# Patient Record
Sex: Male | Born: 1965 | Race: Black or African American | Hispanic: No | Marital: Married | Smoking: Never smoker
Health system: Southern US, Community
[De-identification: ages and names within clinical notes are randomized; demographics above are authoritative.]

## PROBLEM LIST (undated history)

## (undated) DIAGNOSIS — E119 Type 2 diabetes mellitus without complications: Secondary | ICD-10-CM

## (undated) DIAGNOSIS — K2970 Gastritis, unspecified, without bleeding: Secondary | ICD-10-CM

## (undated) DIAGNOSIS — K219 Gastro-esophageal reflux disease without esophagitis: Secondary | ICD-10-CM

## (undated) DIAGNOSIS — D126 Benign neoplasm of colon, unspecified: Secondary | ICD-10-CM

## (undated) DIAGNOSIS — K2990 Gastroduodenitis, unspecified, without bleeding: Secondary | ICD-10-CM

## (undated) DIAGNOSIS — E7800 Pure hypercholesterolemia, unspecified: Secondary | ICD-10-CM

## (undated) DIAGNOSIS — I5022 Chronic systolic (congestive) heart failure: Secondary | ICD-10-CM

## (undated) DIAGNOSIS — I209 Angina pectoris, unspecified: Secondary | ICD-10-CM

## (undated) DIAGNOSIS — Z992 Dependence on renal dialysis: Secondary | ICD-10-CM

## (undated) DIAGNOSIS — I82409 Acute embolism and thrombosis of unspecified deep veins of unspecified lower extremity: Secondary | ICD-10-CM

## (undated) DIAGNOSIS — I2510 Atherosclerotic heart disease of native coronary artery without angina pectoris: Secondary | ICD-10-CM

## (undated) DIAGNOSIS — Z95810 Presence of automatic (implantable) cardiac defibrillator: Secondary | ICD-10-CM

## (undated) DIAGNOSIS — I255 Ischemic cardiomyopathy: Secondary | ICD-10-CM

## (undated) DIAGNOSIS — E785 Hyperlipidemia, unspecified: Secondary | ICD-10-CM

## (undated) DIAGNOSIS — K3184 Gastroparesis: Secondary | ICD-10-CM

## (undated) DIAGNOSIS — I509 Heart failure, unspecified: Secondary | ICD-10-CM

## (undated) DIAGNOSIS — K5090 Crohn's disease, unspecified, without complications: Secondary | ICD-10-CM

## (undated) DIAGNOSIS — R0600 Dyspnea, unspecified: Secondary | ICD-10-CM

## (undated) DIAGNOSIS — I10 Essential (primary) hypertension: Secondary | ICD-10-CM

## (undated) DIAGNOSIS — G4710 Hypersomnia, unspecified: Secondary | ICD-10-CM

## (undated) HISTORY — DX: Essential (primary) hypertension: I10

## (undated) HISTORY — DX: Hypersomnia, unspecified: G47.10

## (undated) HISTORY — PX: COLECTOMY: SHX59

## (undated) HISTORY — DX: Benign neoplasm of colon, unspecified: D12.6

## (undated) HISTORY — DX: Gastroduodenitis, unspecified, without bleeding: K29.90

## (undated) HISTORY — DX: Gastroparesis: K31.84

## (undated) HISTORY — DX: Acute embolism and thrombosis of unspecified deep veins of unspecified lower extremity: I82.409

## (undated) HISTORY — DX: Atherosclerotic heart disease of native coronary artery without angina pectoris: I25.10

## (undated) HISTORY — PX: APPENDECTOMY: SHX54

## (undated) HISTORY — PX: FETAL SURGERY FOR CONGENITAL HERNIA: SHX1618

## (undated) HISTORY — DX: Hyperlipidemia, unspecified: E78.5

## (undated) HISTORY — DX: Chronic systolic (congestive) heart failure: I50.22

## (undated) HISTORY — DX: Ischemic cardiomyopathy: I25.5

## (undated) HISTORY — DX: Gastritis, unspecified, without bleeding: K29.70

## (undated) HISTORY — DX: Dependence on renal dialysis: Z99.2

## (undated) HISTORY — DX: Presence of automatic (implantable) cardiac defibrillator: Z95.810

## (undated) HISTORY — DX: Type 2 diabetes mellitus without complications: E11.9

## (undated) HISTORY — PX: INGUINAL HERNIA REPAIR: SUR1180

## (undated) SURGERY — RIGHT HEART CATH
Anesthesia: LOCAL

## (undated) SURGERY — Surgical Case
Anesthesia: *Unknown

---

## 2000-01-13 ENCOUNTER — Encounter: Admission: RE | Admit: 2000-01-13 | Discharge: 2000-04-12 | Payer: Self-pay | Admitting: Endocrinology

## 2000-01-30 ENCOUNTER — Emergency Department (HOSPITAL_COMMUNITY): Admission: EM | Admit: 2000-01-30 | Discharge: 2000-01-30 | Payer: Self-pay | Admitting: *Deleted

## 2000-12-25 ENCOUNTER — Other Ambulatory Visit: Admission: RE | Admit: 2000-12-25 | Discharge: 2000-12-25 | Payer: Self-pay | Admitting: Gastroenterology

## 2000-12-25 ENCOUNTER — Encounter (INDEPENDENT_AMBULATORY_CARE_PROVIDER_SITE_OTHER): Payer: Self-pay

## 2001-02-14 ENCOUNTER — Encounter (INDEPENDENT_AMBULATORY_CARE_PROVIDER_SITE_OTHER): Payer: Self-pay | Admitting: Specialist

## 2001-02-14 ENCOUNTER — Ambulatory Visit (HOSPITAL_COMMUNITY): Admission: RE | Admit: 2001-02-14 | Discharge: 2001-02-14 | Payer: Self-pay | Admitting: Gastroenterology

## 2001-05-02 ENCOUNTER — Encounter: Payer: Self-pay | Admitting: Gastroenterology

## 2001-05-02 ENCOUNTER — Ambulatory Visit (HOSPITAL_COMMUNITY): Admission: RE | Admit: 2001-05-02 | Discharge: 2001-05-02 | Payer: Self-pay | Admitting: Gastroenterology

## 2001-05-14 ENCOUNTER — Ambulatory Visit (HOSPITAL_COMMUNITY): Admission: RE | Admit: 2001-05-14 | Discharge: 2001-05-14 | Payer: Self-pay | Admitting: Endocrinology

## 2001-05-14 ENCOUNTER — Encounter: Payer: Self-pay | Admitting: Endocrinology

## 2002-01-15 ENCOUNTER — Inpatient Hospital Stay (HOSPITAL_COMMUNITY): Admission: EM | Admit: 2002-01-15 | Discharge: 2002-01-16 | Payer: Self-pay

## 2002-01-15 ENCOUNTER — Encounter: Payer: Self-pay | Admitting: Emergency Medicine

## 2002-01-19 ENCOUNTER — Encounter: Payer: Self-pay | Admitting: Emergency Medicine

## 2002-01-19 ENCOUNTER — Emergency Department (HOSPITAL_COMMUNITY): Admission: EM | Admit: 2002-01-19 | Discharge: 2002-01-19 | Payer: Self-pay | Admitting: Emergency Medicine

## 2002-01-22 ENCOUNTER — Emergency Department (HOSPITAL_COMMUNITY): Admission: EM | Admit: 2002-01-22 | Discharge: 2002-01-22 | Payer: Self-pay | Admitting: *Deleted

## 2002-01-23 ENCOUNTER — Inpatient Hospital Stay (HOSPITAL_COMMUNITY): Admission: EM | Admit: 2002-01-23 | Discharge: 2002-01-29 | Payer: Self-pay | Admitting: Emergency Medicine

## 2002-01-23 ENCOUNTER — Ambulatory Visit (HOSPITAL_COMMUNITY): Admission: RE | Admit: 2002-01-23 | Discharge: 2002-01-23 | Payer: Self-pay | Admitting: Gastroenterology

## 2002-01-24 ENCOUNTER — Encounter: Payer: Self-pay | Admitting: Family Medicine

## 2002-01-24 ENCOUNTER — Encounter: Payer: Self-pay | Admitting: Gastroenterology

## 2002-01-25 ENCOUNTER — Encounter: Payer: Self-pay | Admitting: Family Medicine

## 2002-02-12 ENCOUNTER — Inpatient Hospital Stay (HOSPITAL_COMMUNITY): Admission: EM | Admit: 2002-02-12 | Discharge: 2002-02-26 | Payer: Self-pay | Admitting: Emergency Medicine

## 2002-02-12 ENCOUNTER — Encounter (INDEPENDENT_AMBULATORY_CARE_PROVIDER_SITE_OTHER): Payer: Self-pay | Admitting: Specialist

## 2002-02-12 ENCOUNTER — Encounter: Payer: Self-pay | Admitting: Internal Medicine

## 2002-02-19 ENCOUNTER — Encounter: Payer: Self-pay | Admitting: General Surgery

## 2002-03-07 ENCOUNTER — Encounter: Payer: Self-pay | Admitting: General Surgery

## 2002-03-08 ENCOUNTER — Inpatient Hospital Stay (HOSPITAL_COMMUNITY): Admission: RE | Admit: 2002-03-08 | Discharge: 2002-03-12 | Payer: Self-pay | Admitting: General Surgery

## 2002-03-11 ENCOUNTER — Encounter: Payer: Self-pay | Admitting: General Surgery

## 2002-03-14 ENCOUNTER — Inpatient Hospital Stay (HOSPITAL_COMMUNITY): Admission: AD | Admit: 2002-03-14 | Discharge: 2002-03-20 | Payer: Self-pay | Admitting: Endocrinology

## 2002-03-15 ENCOUNTER — Encounter: Payer: Self-pay | Admitting: Surgery

## 2002-03-16 ENCOUNTER — Encounter: Payer: Self-pay | Admitting: Internal Medicine

## 2002-04-02 ENCOUNTER — Inpatient Hospital Stay (HOSPITAL_COMMUNITY): Admission: EM | Admit: 2002-04-02 | Discharge: 2002-04-09 | Payer: Self-pay | Admitting: Endocrinology

## 2002-04-02 ENCOUNTER — Encounter: Payer: Self-pay | Admitting: Endocrinology

## 2002-04-04 ENCOUNTER — Encounter: Payer: Self-pay | Admitting: Endocrinology

## 2002-04-07 ENCOUNTER — Encounter: Payer: Self-pay | Admitting: Internal Medicine

## 2002-04-19 ENCOUNTER — Emergency Department (HOSPITAL_COMMUNITY): Admission: EM | Admit: 2002-04-19 | Discharge: 2002-04-19 | Payer: Self-pay | Admitting: Emergency Medicine

## 2002-04-21 ENCOUNTER — Inpatient Hospital Stay (HOSPITAL_COMMUNITY): Admission: EM | Admit: 2002-04-21 | Discharge: 2002-04-25 | Payer: Self-pay | Admitting: Emergency Medicine

## 2002-04-22 ENCOUNTER — Encounter: Payer: Self-pay | Admitting: Internal Medicine

## 2002-04-26 ENCOUNTER — Inpatient Hospital Stay (HOSPITAL_COMMUNITY): Admission: EM | Admit: 2002-04-26 | Discharge: 2002-05-08 | Payer: Self-pay | Admitting: Internal Medicine

## 2002-04-29 ENCOUNTER — Encounter: Payer: Self-pay | Admitting: Endocrinology

## 2002-04-30 ENCOUNTER — Encounter: Payer: Self-pay | Admitting: Internal Medicine

## 2002-05-01 ENCOUNTER — Encounter: Payer: Self-pay | Admitting: Internal Medicine

## 2002-05-11 ENCOUNTER — Encounter: Payer: Self-pay | Admitting: Internal Medicine

## 2002-05-11 ENCOUNTER — Inpatient Hospital Stay (HOSPITAL_COMMUNITY): Admission: EM | Admit: 2002-05-11 | Discharge: 2002-05-20 | Payer: Self-pay | Admitting: Emergency Medicine

## 2002-05-14 ENCOUNTER — Encounter: Payer: Self-pay | Admitting: Internal Medicine

## 2002-05-24 ENCOUNTER — Emergency Department (HOSPITAL_COMMUNITY): Admission: EM | Admit: 2002-05-24 | Discharge: 2002-05-24 | Payer: Self-pay | Admitting: Emergency Medicine

## 2002-05-25 ENCOUNTER — Emergency Department (HOSPITAL_COMMUNITY): Admission: EM | Admit: 2002-05-25 | Discharge: 2002-05-25 | Payer: Self-pay | Admitting: Emergency Medicine

## 2002-05-26 ENCOUNTER — Emergency Department (HOSPITAL_COMMUNITY): Admission: EM | Admit: 2002-05-26 | Discharge: 2002-05-26 | Payer: Self-pay | Admitting: Emergency Medicine

## 2002-05-30 ENCOUNTER — Encounter: Admission: RE | Admit: 2002-05-30 | Discharge: 2002-05-30 | Payer: Self-pay | Admitting: *Deleted

## 2002-06-07 ENCOUNTER — Encounter (INDEPENDENT_AMBULATORY_CARE_PROVIDER_SITE_OTHER): Payer: Self-pay | Admitting: *Deleted

## 2002-06-07 ENCOUNTER — Inpatient Hospital Stay (HOSPITAL_COMMUNITY): Admission: RE | Admit: 2002-06-07 | Discharge: 2002-06-24 | Payer: Self-pay

## 2002-06-19 ENCOUNTER — Encounter: Payer: Self-pay | Admitting: General Surgery

## 2002-06-28 ENCOUNTER — Encounter: Admission: RE | Admit: 2002-06-28 | Discharge: 2002-06-28 | Payer: Self-pay | Admitting: *Deleted

## 2002-07-05 ENCOUNTER — Encounter: Payer: Self-pay | Admitting: General Surgery

## 2002-07-05 ENCOUNTER — Encounter: Admission: RE | Admit: 2002-07-05 | Discharge: 2002-07-05 | Payer: Self-pay | Admitting: General Surgery

## 2002-07-06 ENCOUNTER — Encounter: Payer: Self-pay | Admitting: Emergency Medicine

## 2002-07-06 ENCOUNTER — Emergency Department (HOSPITAL_COMMUNITY): Admission: EM | Admit: 2002-07-06 | Discharge: 2002-07-06 | Payer: Self-pay | Admitting: Emergency Medicine

## 2002-07-13 ENCOUNTER — Inpatient Hospital Stay (HOSPITAL_COMMUNITY): Admission: EM | Admit: 2002-07-13 | Discharge: 2002-07-20 | Payer: Self-pay | Admitting: Emergency Medicine

## 2002-07-15 ENCOUNTER — Encounter: Payer: Self-pay | Admitting: Internal Medicine

## 2002-07-18 ENCOUNTER — Encounter: Payer: Self-pay | Admitting: Internal Medicine

## 2002-07-26 ENCOUNTER — Inpatient Hospital Stay (HOSPITAL_COMMUNITY): Admission: AD | Admit: 2002-07-26 | Discharge: 2002-07-31 | Payer: Self-pay | Admitting: Gastroenterology

## 2002-07-27 ENCOUNTER — Encounter: Payer: Self-pay | Admitting: Gastroenterology

## 2002-09-06 ENCOUNTER — Encounter: Admission: RE | Admit: 2002-09-06 | Discharge: 2002-09-06 | Payer: Self-pay | Admitting: *Deleted

## 2002-11-26 ENCOUNTER — Inpatient Hospital Stay (HOSPITAL_COMMUNITY): Admission: EM | Admit: 2002-11-26 | Discharge: 2002-11-29 | Payer: Self-pay | Admitting: Emergency Medicine

## 2002-11-27 ENCOUNTER — Encounter: Payer: Self-pay | Admitting: Internal Medicine

## 2004-04-04 ENCOUNTER — Inpatient Hospital Stay (HOSPITAL_COMMUNITY): Admission: EM | Admit: 2004-04-04 | Discharge: 2004-04-09 | Payer: Self-pay | Admitting: Emergency Medicine

## 2004-04-26 ENCOUNTER — Encounter (HOSPITAL_COMMUNITY): Admission: RE | Admit: 2004-04-26 | Discharge: 2004-07-25 | Payer: Self-pay | Admitting: Cardiovascular Disease

## 2004-05-03 ENCOUNTER — Ambulatory Visit: Payer: Self-pay | Admitting: *Deleted

## 2004-05-11 ENCOUNTER — Encounter: Admission: RE | Admit: 2004-05-11 | Discharge: 2004-08-09 | Payer: Self-pay | Admitting: Family Medicine

## 2004-05-28 ENCOUNTER — Ambulatory Visit (HOSPITAL_COMMUNITY): Admission: RE | Admit: 2004-05-28 | Discharge: 2004-05-29 | Payer: Self-pay | Admitting: Cardiovascular Disease

## 2004-07-26 ENCOUNTER — Encounter (HOSPITAL_COMMUNITY): Admission: RE | Admit: 2004-07-26 | Discharge: 2004-09-03 | Payer: Self-pay | Admitting: Cardiovascular Disease

## 2004-09-28 ENCOUNTER — Ambulatory Visit: Payer: Self-pay | Admitting: Cardiology

## 2004-11-15 ENCOUNTER — Inpatient Hospital Stay (HOSPITAL_COMMUNITY): Admission: EM | Admit: 2004-11-15 | Discharge: 2004-11-18 | Payer: Self-pay | Admitting: *Deleted

## 2005-01-03 ENCOUNTER — Ambulatory Visit: Payer: Self-pay | Admitting: Cardiology

## 2005-04-12 ENCOUNTER — Ambulatory Visit: Payer: Self-pay | Admitting: Cardiology

## 2005-04-29 ENCOUNTER — Encounter: Admission: RE | Admit: 2005-04-29 | Discharge: 2005-07-28 | Payer: Self-pay | Admitting: Family Medicine

## 2005-08-05 ENCOUNTER — Ambulatory Visit: Payer: Self-pay | Admitting: Cardiovascular Disease

## 2006-08-20 ENCOUNTER — Encounter: Payer: Self-pay | Admitting: Vascular Surgery

## 2006-08-20 ENCOUNTER — Inpatient Hospital Stay (HOSPITAL_COMMUNITY): Admission: EM | Admit: 2006-08-20 | Discharge: 2006-08-23 | Payer: Self-pay | Admitting: Emergency Medicine

## 2006-09-05 DIAGNOSIS — Z8719 Personal history of other diseases of the digestive system: Secondary | ICD-10-CM | POA: Insufficient documentation

## 2007-09-14 ENCOUNTER — Ambulatory Visit: Payer: Self-pay | Admitting: Gastroenterology

## 2007-09-14 LAB — CONVERTED CEMR LAB
Basophils Relative: 0 % (ref 0.0–1.0)
Eosinophils Relative: 1.4 % (ref 0.0–5.0)
Hemoglobin: 12.6 g/dL — ABNORMAL LOW (ref 13.0–17.0)
MCV: 83.3 fL (ref 78.0–100.0)
Platelets: 259 10*3/uL (ref 150–400)
RBC: 4.4 M/uL (ref 4.22–5.81)
Saturation Ratios: 10.7 % — ABNORMAL LOW (ref 20.0–50.0)
Transferrin: 254.1 mg/dL (ref >=212.0)
Vitamin B-12: 343 pg/mL (ref 211–911)

## 2007-10-10 ENCOUNTER — Encounter: Payer: Self-pay | Admitting: Gastroenterology

## 2007-10-10 ENCOUNTER — Ambulatory Visit: Payer: Self-pay | Admitting: Gastroenterology

## 2008-01-14 ENCOUNTER — Encounter (INDEPENDENT_AMBULATORY_CARE_PROVIDER_SITE_OTHER): Payer: Self-pay | Admitting: Emergency Medicine

## 2008-01-14 ENCOUNTER — Ambulatory Visit: Payer: Self-pay | Admitting: Vascular Surgery

## 2008-01-14 ENCOUNTER — Emergency Department (HOSPITAL_COMMUNITY): Admission: EM | Admit: 2008-01-14 | Discharge: 2008-01-14 | Payer: Self-pay | Admitting: Emergency Medicine

## 2008-01-25 ENCOUNTER — Ambulatory Visit: Payer: Self-pay | Admitting: Vascular Surgery

## 2008-01-30 ENCOUNTER — Ambulatory Visit (HOSPITAL_COMMUNITY): Admission: RE | Admit: 2008-01-30 | Discharge: 2008-01-30 | Payer: Self-pay | Admitting: Orthopedic Surgery

## 2008-02-09 ENCOUNTER — Inpatient Hospital Stay (HOSPITAL_COMMUNITY): Admission: EM | Admit: 2008-02-09 | Discharge: 2008-02-11 | Payer: Self-pay | Admitting: Emergency Medicine

## 2008-02-09 ENCOUNTER — Ambulatory Visit: Payer: Self-pay | Admitting: Internal Medicine

## 2008-02-11 ENCOUNTER — Telehealth: Payer: Self-pay | Admitting: Gastroenterology

## 2008-02-12 ENCOUNTER — Inpatient Hospital Stay (HOSPITAL_COMMUNITY): Admission: EM | Admit: 2008-02-12 | Discharge: 2008-02-13 | Payer: Self-pay | Admitting: Emergency Medicine

## 2008-09-05 HISTORY — PX: CARDIAC DEFIBRILLATOR PLACEMENT: SHX171

## 2008-09-25 DIAGNOSIS — K2990 Gastroduodenitis, unspecified, without bleeding: Secondary | ICD-10-CM

## 2008-09-25 DIAGNOSIS — R198 Other specified symptoms and signs involving the digestive system and abdomen: Secondary | ICD-10-CM | POA: Insufficient documentation

## 2008-09-25 DIAGNOSIS — K5730 Diverticulosis of large intestine without perforation or abscess without bleeding: Secondary | ICD-10-CM | POA: Insufficient documentation

## 2008-09-25 DIAGNOSIS — E785 Hyperlipidemia, unspecified: Secondary | ICD-10-CM | POA: Insufficient documentation

## 2008-09-25 DIAGNOSIS — K2970 Gastritis, unspecified, without bleeding: Secondary | ICD-10-CM | POA: Insufficient documentation

## 2008-09-25 DIAGNOSIS — E119 Type 2 diabetes mellitus without complications: Secondary | ICD-10-CM | POA: Insufficient documentation

## 2008-09-25 DIAGNOSIS — D126 Benign neoplasm of colon, unspecified: Secondary | ICD-10-CM | POA: Insufficient documentation

## 2008-09-30 ENCOUNTER — Ambulatory Visit: Payer: Self-pay | Admitting: Gastroenterology

## 2008-12-12 ENCOUNTER — Inpatient Hospital Stay (HOSPITAL_COMMUNITY): Admission: EM | Admit: 2008-12-12 | Discharge: 2008-12-16 | Payer: Self-pay | Admitting: Cardiology

## 2009-01-02 ENCOUNTER — Inpatient Hospital Stay (HOSPITAL_COMMUNITY): Admission: RE | Admit: 2009-01-02 | Discharge: 2009-01-03 | Payer: Self-pay | Admitting: Cardiology

## 2009-10-07 ENCOUNTER — Inpatient Hospital Stay (HOSPITAL_COMMUNITY): Admission: EM | Admit: 2009-10-07 | Discharge: 2009-10-08 | Payer: Self-pay | Admitting: Emergency Medicine

## 2010-11-19 ENCOUNTER — Encounter (INDEPENDENT_AMBULATORY_CARE_PROVIDER_SITE_OTHER): Payer: Self-pay | Admitting: *Deleted

## 2010-11-23 NOTE — Letter (Signed)
Summary: Appointment - Reminder [place], [place]  [address] [place]   [place], [place] [address]   Phone: [phone]  Fax: [phone]     November 19, 2010 MRN: [id number]   [facility] [place], [place]  [address]   Dear Mr. MCGREAL,  [facility] records indicate that it is time to schedule a follow-up appointment.  Dr.Allred recommended that you follow up with [place] in April. It is very important that we reach you to schedule this appointment. We look forward to participating in your health care needs. Please contact us at the number listed above at your earliest convenience to schedule your appointment.  If you are unable to make an appointment at this time, give [place] a call so we can update our records.     Sincerely,   Public relations account executive

## 2010-11-24 LAB — DIFFERENTIAL
Basophils Absolute: 0.1 10*3/uL (ref 0.0–0.1)
Basophils Relative: 1 % (ref 0–1)
Eosinophils Absolute: 0.2 10*3/uL (ref 0.0–0.7)
Eosinophils Relative: 4 % (ref 0–5)
Neutrophils Relative %: 57 % (ref 43–77)

## 2010-11-24 LAB — CBC
HCT: 37 % — ABNORMAL LOW (ref 39.0–52.0)
Hemoglobin: 12.1 g/dL — ABNORMAL LOW (ref 13.0–17.0)
Hemoglobin: 12.2 g/dL — ABNORMAL LOW (ref 13.0–17.0)
MCHC: 33.9 g/dL (ref 30.0–36.0)
MCV: 87 fL (ref 78.0–100.0)
RBC: 4.15 MIL/uL — ABNORMAL LOW (ref 4.22–5.81)
WBC: 5.7 10*3/uL (ref 4.0–10.5)

## 2010-11-24 LAB — GLUCOSE, CAPILLARY
Glucose-Capillary: 118 mg/dL — ABNORMAL HIGH (ref 70–99)
Glucose-Capillary: 212 mg/dL — ABNORMAL HIGH (ref 70–99)

## 2010-11-24 LAB — LIPID PANEL
HDL: 51 mg/dL (ref >39)
LDL Cholesterol: 135 mg/dL — ABNORMAL HIGH (ref 0–99)
Total CHOL/HDL Ratio: 4.2 RATIO
VLDL: 29 mg/dL (ref 0–40)

## 2010-11-24 LAB — PROTIME-INR
INR: 1.35 (ref 0.00–1.49)
Prothrombin Time: 16.6 seconds — ABNORMAL HIGH (ref 11.6–15.2)

## 2010-11-24 LAB — BASIC METABOLIC PANEL
BUN: 17 mg/dL (ref 6–23)
Chloride: 105 mEq/L (ref 96–112)
GFR calc Af Amer: >60 mL/min (ref >60)
Potassium: 4.1 mEq/L (ref 3.5–5.1)

## 2010-11-24 LAB — HEMOGLOBIN A1C: Hgb A1c MFr Bld: 11.2 % — ABNORMAL HIGH (ref 4.6–6.1)

## 2010-11-24 LAB — CARDIAC PANEL(CRET KIN+CKTOT+MB+TROPI): CK, MB: 1.7 ng/mL (ref 0.3–4.0)

## 2010-11-24 LAB — CK TOTAL AND CKMB (NOT AT ARMC): Relative Index: 1.1 (ref 0.0–2.5)

## 2010-11-24 LAB — POCT CARDIAC MARKERS
CKMB, poc: 1.6 ng/mL (ref 1.0–8.0)
Myoglobin, poc: 86 ng/mL (ref 12–200)

## 2010-11-24 LAB — TROPONIN I: Troponin I: 0.04 ng/mL (ref 0.00–0.06)

## 2010-11-24 LAB — BRAIN NATRIURETIC PEPTIDE: Pro B Natriuretic peptide (BNP): 48 pg/mL (ref 0.0–100.0)

## 2010-12-14 LAB — PROTIME-INR: Prothrombin Time: 14 seconds (ref 11.6–15.2)

## 2010-12-14 LAB — GLUCOSE, CAPILLARY: Glucose-Capillary: 104 mg/dL — ABNORMAL HIGH (ref 70–99)

## 2010-12-15 LAB — BASIC METABOLIC PANEL
BUN: 14 mg/dL (ref 6–23)
BUN: 16 mg/dL (ref 6–23)
BUN: 23 mg/dL (ref 6–23)
CO2: 28 mEq/L (ref 19–32)
Calcium: 8.3 mg/dL — ABNORMAL LOW (ref 8.4–10.5)
Calcium: 8.5 mg/dL (ref 8.4–10.5)
Creatinine, Ser: 0.87 mg/dL (ref 0.4–1.5)
Creatinine, Ser: 1.01 mg/dL (ref 0.4–1.5)
Creatinine, Ser: 1.1 mg/dL (ref 0.4–1.5)
GFR calc Af Amer: >60 mL/min (ref >60)
GFR calc non Af Amer: >60 mL/min (ref >60)
GFR calc non Af Amer: >60 mL/min (ref >60)
GFR calc non Af Amer: >60 mL/min (ref >60)
GFR calc non Af Amer: >60 mL/min (ref >60)
Glucose, Bld: 105 mg/dL — ABNORMAL HIGH (ref 70–99)
Glucose, Bld: 105 mg/dL — ABNORMAL HIGH (ref 70–99)
Glucose, Bld: 106 mg/dL — ABNORMAL HIGH (ref 70–99)
Glucose, Bld: 54 mg/dL — ABNORMAL LOW (ref 70–99)
Potassium: 3.4 mEq/L — ABNORMAL LOW (ref 3.5–5.1)
Potassium: 4 mEq/L (ref 3.5–5.1)
Potassium: 4 mEq/L (ref 3.5–5.1)
Sodium: 138 mEq/L (ref 135–145)
Sodium: 140 mEq/L (ref 135–145)

## 2010-12-15 LAB — CBC
HCT: 36.7 % — ABNORMAL LOW (ref 39.0–52.0)
HCT: 37.2 % — ABNORMAL LOW (ref 39.0–52.0)
HCT: 37.2 % — ABNORMAL LOW (ref 39.0–52.0)
Hemoglobin: 11.7 g/dL — ABNORMAL LOW (ref 13.0–17.0)
Hemoglobin: 12.2 g/dL — ABNORMAL LOW (ref 13.0–17.0)
Hemoglobin: 12.3 g/dL — ABNORMAL LOW (ref 13.0–17.0)
MCHC: 32.8 g/dL (ref 30.0–36.0)
MCV: 81.3 fL (ref 78.0–100.0)
MCV: 82.7 fL (ref 78.0–100.0)
Platelets: 191 10*3/uL (ref 150–400)
Platelets: 220 10*3/uL (ref 150–400)
Platelets: 225 10*3/uL (ref 150–400)
RBC: 4.47 MIL/uL (ref 4.22–5.81)
RBC: 4.5 MIL/uL (ref 4.22–5.81)
RDW: 15 % (ref 11.5–15.5)
RDW: 15.6 % — ABNORMAL HIGH (ref 11.5–15.5)
RDW: 15.9 % — ABNORMAL HIGH (ref 11.5–15.5)
RDW: 15.9 % — ABNORMAL HIGH (ref 11.5–15.5)
WBC: 5.6 10*3/uL (ref 4.0–10.5)
WBC: 6.9 10*3/uL (ref 4.0–10.5)

## 2010-12-15 LAB — GLUCOSE, CAPILLARY
Glucose-Capillary: 42 mg/dL — ABNORMAL LOW (ref 70–99)
Glucose-Capillary: 62 mg/dL — ABNORMAL LOW (ref 70–99)
Glucose-Capillary: 89 mg/dL (ref 70–99)
Glucose-Capillary: 110 mg/dL — ABNORMAL HIGH (ref 70–99)
Glucose-Capillary: 125 mg/dL — ABNORMAL HIGH (ref 70–99)
Glucose-Capillary: 150 mg/dL — ABNORMAL HIGH (ref 70–99)
Glucose-Capillary: 154 mg/dL — ABNORMAL HIGH (ref 70–99)
Glucose-Capillary: 190 mg/dL — ABNORMAL HIGH (ref 70–99)
Glucose-Capillary: 228 mg/dL — ABNORMAL HIGH (ref 70–99)
Glucose-Capillary: 74 mg/dL (ref 70–99)
Glucose-Capillary: 84 mg/dL (ref 70–99)

## 2010-12-15 LAB — CARDIAC PANEL(CRET KIN+CKTOT+MB+TROPI)
CK, MB: 2.7 ng/mL (ref 0.3–4.0)
Total CK: 225 U/L (ref 7–232)
Troponin I: 0.03 ng/mL (ref 0.00–0.06)

## 2010-12-15 LAB — PROTIME-INR
INR: 1.1 (ref 0.00–1.49)
INR: 1.2 (ref 0.00–1.49)
Prothrombin Time: 14.5 seconds (ref 11.6–15.2)
Prothrombin Time: 14.2 seconds (ref 11.6–15.2)
Prothrombin Time: 14.3 seconds (ref 11.6–15.2)
Prothrombin Time: 15.1 seconds (ref 11.6–15.2)
Prothrombin Time: 15.2 seconds (ref 11.6–15.2)

## 2010-12-15 LAB — COMPREHENSIVE METABOLIC PANEL
Alkaline Phosphatase: 91 U/L (ref 39–117)
BUN: 11 mg/dL (ref 6–23)
CO2: 32 mEq/L (ref 19–32)
Chloride: 101 mEq/L (ref 96–112)
Creatinine, Ser: 0.81 mg/dL (ref 0.4–1.5)
GFR calc non Af Amer: >60 mL/min (ref >60)
Total Bilirubin: 0.7 mg/dL (ref 0.3–1.2)

## 2010-12-15 LAB — DIFFERENTIAL
Basophils Absolute: 0 10*3/uL (ref 0.0–0.1)
Basophils Relative: 0 % (ref 0–1)
Eosinophils Relative: 4 % (ref 0–5)
Lymphocytes Relative: 26 % (ref 12–46)
Neutro Abs: 3.5 10*3/uL (ref 1.7–7.7)

## 2010-12-15 LAB — APTT: aPTT: 82 seconds — ABNORMAL HIGH (ref 24–37)

## 2010-12-15 LAB — HEPARIN LEVEL (UNFRACTIONATED)
Heparin Unfractionated: 0.9 IU/mL — ABNORMAL HIGH (ref 0.30–0.70)
Heparin Unfractionated: 1.17 IU/mL — ABNORMAL HIGH (ref 0.30–0.70)
Heparin Unfractionated: <0.1 IU/mL — ABNORMAL LOW (ref 0.30–0.70)

## 2010-12-15 LAB — HEMOGLOBIN A1C: Hgb A1c MFr Bld: 9.7 % — ABNORMAL HIGH (ref 4.6–6.1)

## 2010-12-15 LAB — MAGNESIUM: Magnesium: 1.5 mg/dL (ref 1.5–2.5)

## 2010-12-15 LAB — BRAIN NATRIURETIC PEPTIDE: Pro B Natriuretic peptide (BNP): 633 pg/mL — ABNORMAL HIGH (ref 0.0–100.0)

## 2011-01-18 NOTE — Consult Note (Signed)
NEW PATIENT CONSULTATION   [name], [name]  DOB:  March 25, 1966                                       01/25/2008  HYIFO#:[id number]   The patient presents today for evaluation of right leg deep venous  thrombosis.  He is referred by Dr. Odis [name] who had been  following him for history of deep venous thrombosis.  The patient had an  episode of pain in his right leg and had a repeat ultrasound done at  that [facility] on Jan 14, 2008.  This revealed a right  popliteal deep venous thrombosis with a partial occlusion and the common  and profunda were open.  The patient did have triphasic wave forms in  the popliteal and posterior tibial arteries have.  Did not have any  evidence of left leg deep venous thrombosis.  He was treated problem  with Lovenox and Coumadin and had continued pain in his right foot.  He  did have plain films of his right foot on 01/18/2008 and these showed no  evidence of fracture.   PAST HISTORY:  Is significant for a long history of insulin dependent  diabetes.  He has elevated blood pressure elevated cholesterol.  He has  undergone prior cardiac stenting after myocardial infarction in 2005.  Does have a strong family history of premature atherosclerotic disease  in father.  He is married with three children.  Works as a Radiographer, therapeutic.  Does not smoke, or drink alcohol.   REVIEW OF SYSTEMS:  He does have a history of esophageal reflux,  diarrhea, and prior thrombus.   ALLERGIES:  No known allergies.   CURRENT MEDICATIONS:  Coumadin, Lantus, Furosemide, Lipitor, colestipol,  calcium supplement, lisinopril, Nexium, Toprol, and vitamins.   PHYSICAL EXAM:  A well-developed black male appearing stated age of 76.  He does have 1 to 2+ dorsalis pedis pulse on the right and no evidence  of tissue loss.  He has not had any swelling and does not have any  tenderness in his calf or popliteal fossa.  He does have compression  garments that he wears continuously.   I reviewed his head duplex with the patient.  I do not feel that he has  any arterial compromise in his right foot and I do not believe that his  popliteal thrombus is responsible for the pain that he is having in his  foot.  This has been persistent and he is requiring narcotics for relief  of this.  I have discussed this with Dr. Maylon [name] by telephone and he,  in all likelihood, will undergo further orthopedic evaluation for  determination of the cause of his pain.  Also would consider coagulable  workup with his history of multiple prior deep venous thromboses.  He  will see [place] again on an as-needed basis.   [name], M.D.  Electronically Signed   TFE/MEDQ  D:  01/25/2008  T:  01/29/2008  Job:  [id number]

## 2011-01-18 NOTE — Discharge Summary (Signed)
NAMECARA, AGUINO            ACCOUNT NO.:  [id number]   MEDICAL RECORD NO.:  [id number]          PATIENT TYPE:  INP   LOCATION:  2039                         FACILITY:  [facility]   PHYSICIAN:  Jerline Pain, MD      DATE OF BIRTH:  [birth year]   DATE OF ADMISSION:  01/02/2009  DATE OF DISCHARGE:  01/03/2009                               DISCHARGE SUMMARY   CARDIOLOGIST:  Jerline Pain, MD   FINAL DIAGNOSES:  1. Ischemic cardiomyopathy, ejection fraction less than 35%.  2. Insertion of dual-chamber St. Jude defibrillator, serial number      [id number], model Current Plus DR [id number].  Atrial lead Tendril,      serial number [id number].  Ventricular lead Durata, serial number      [id number].  Atrial P-wave 4.3 mV, threshold 0.5 volts at 0.5      milliseconds, impedance 410 ohms, pacing less than 1%, episodes no      node switches.  Ventricular R-wave 11.7 mV, threshold 0.5 volts at      0.5 milliseconds, impedance 530 ohms, less than 1% pacing, no      tachycardiac episodes overnight.  St. Jude health #1 [id number].  3. Diabetes mellitus.   BRIEF HOSPITAL COURSE:  A 46 year old male with ischemic cardiomyopathy,  ejection fraction less than 35% with insertion of dual chamber ICD for  primary prevention.  This was performed by Dr. Rodell Perna.  Overnight,  he had blood pressures in the 140s-150s/100 and was given hydralazine.  On the day of discharge, ICD interrogation as above shows normal  operation.  Chest x-ray performed postoperatively shows no evidence of  pneumothorax.   VITAL SIGNS:  Blood pressure 111/75, heart rate 64, respirations 20, and  temperature 97.6.  GENERAL:  Alert and oriented x3 in no acute distress.  CARDIOVASCULAR:  Regular rate and rhythm.  ICD site is clean, dry, and  intact.  He is still wearing arm harness.  ABDOMEN:  Soft and nontender.  Normoactive bowel sounds.  EXTREMITIES:  No clubbing, cyanosis, or edema.  The patient does feel  nausea.   DISCHARGE  INSTRUCTIONS:  No lifting above his head for the next 30 days.  Discharge sheet given to him.  He has a followup appointment with Doristine Bosworth on Jan 09, 2009, at 10:10 a.m.  He will also restart his Coumadin  as previously prescribed.   DISCHARGE MEDICATIONS:  1. Nexium 40 mg a day.  2. Potassium as before with 3 baby aspirin 81 mg a day.  3. Lantus as before.  4. Lisinopril 40 mg a day.  5. Coreg 25 mg twice a day.  6. Lipitor 80 mg a day.  7. Hydralazine 25 mg 3 times a day.  8. Coumadin home dose.  9. Digoxin 0.125 mg a day.  10.Imdur 60 mg a day.  11.Lasix 80 mg twice a day.  12.Vicodin as needed for pain.   The patient is to contact us immediately if any worrisome symptoms  develop such as fevers, chills, nausea, vomiting, or chest pain.      Jerline Pain, MD  Electronically Signed     MCS/MEDQ  D:  01/03/2009  T:  01/04/2009  Job:  [id number]   cc:   Barnett Abu, M.D.

## 2011-01-18 NOTE — H&P (Signed)
[name], [name]            ACCOUNT NO.:  [id number]   MEDICAL RECORD NO.:  [id number]          PATIENT TYPE:  INP   LOCATION:  0102                         [facility]ACILITY[facility]  [facility]   PHYSICIAN:  Annita Brod, M.D.DATE OF BIRTH:  18-Mar-1966   DATE OF ADMISSION:  02/11/2008  DATE OF DISCHARGE:                              HISTORY & PHYSICAL   PRIMARY CARE PHYSICIAN:  The patient's PCP is Candace Gallus, M.D.   CHIEF COMPLAINT:  Abdominal pain with nausea and vomiting.   HISTORY OF THE PRESENT ILLNESS:  The patient is a 45 year old African-  American male who was first admitted [facility]  February 09, 2008 for diabetic gastroparesis.  He was treated for several  days and discharged on February 11, 2008.  He continued to have episodes of  nausea and vomiting at home, was unable to keep anything down, and  returned back to the emergency room that evening.  CT scan of the  abdomen was unremarkable for obstruction, although it did note some  peripheral signs of pneumonia that happened to be noted on the scan.  The patient was given a dose of Rocephin and Zithromax as well as  multiple doses of pain and nausea medications.  Because of the patient's  persistent symptoms it was felt best that he come into the emergency  room to be admitted for further evaluation and treatment.   REVIEW OF SYSTEMS:  Currently he is tired and complains of continued  nausea and vomiting.  He denies any headaches, vision changes or  dysphagia.  No  chest pain or palpitations.  No shortness of breath,  wheezing or coughing.  He is complaining of some generalized abdominal  pain.  No hematuria or dysuria.  No diarrhea.  No focal extremity  numbness, weakness or pain.  The review of systems is otherwise  negative.   PAST MEDICAL HISTORY:  The patient's past medical history includes:  1. Diabetes mellitus.  2. Previous history of gastroparesis.  3. History of recurrent DVT on  Coumadin.  4. Hyperlipidemia.  5. History of inflammatory bowel disease status post right      hemicolectomy.  6. Hypertension.  7. GERD.  8. CAD status post stent.   MEDICATIONS:  The medications the patient is on include:  1. Coumadin.  2. Lantus 100 subcu daily.  3. Lasix 40.  4. Lipitor 80.  5. Colestipol 1.  6. Kay-Ciel 10.  7. Lisinopril 40.  8. Nexium 40.  9. Toprol XL 25.  10.Multivitamin daily.  11.Reglan 10 every 6 hours as needed.   ALLERGIES:  He has no known drug allergies.   SOCIAL HISTORY:  No tobacco, alcohol or drug use.   FAMILY HISTORY:  The family history is noncontributory.   PHYSICAL EXAMINATION:  VITAL SIGNS:  The patient's vital signs on  admission; heart rate initially was 130 and is now down to 118, blood  pressure initially was 183/111 and is now down to 168/104, respiratory  rate is 22, and O2 sat is 100% on room air.  GENERAL APPEARANCE:  In general he is somewhat  somnolent, though he is  alert and oriented x3.  HEENT:  Normocephalic and atraumatic.  Mucous membranes are dry.  NECK:  The patient has no carotid bruits.  HEART:  The heart has an irregular rhythm with mild tachycardia.  LUNGS:  The lungs are clear to auscultation bilaterally.  ABDOMEN:  The abdomen is soft and nontender, but mildly distended.  Hypoactive bowel sounds.  EXTREMITIES:  The extremities show no clubbing, cyanosis or edema.   LABORATORY DATA:  White count 9.9, H&H 14.6/43, MCV 82, and platelet  count 278,000 with 83% neutrophils.  Sodium 140, potassium 2.9, chloride  102, bicarb 26, BUN 3, creatinine 0.73, and glucose 127.  LFTs are  notable for an albumin of 3.2, otherwise they were unremarkable.  Lipase  level 15.  INR 2.  UA is unremarkable, except for a small amount a  hemoglobin with 3-6 red cells.  Acute abdominal series and chest notes  questionable left perihilar airspace opacity and nonobstructive bowel  gas pattern.  CT notes patchy lingular and lower lobe  opacities;  lingular density could represent alveolitis versus bronchopneumonia.  The lower lobe opacities are most less likely atelectasis, there is mild  fatty infiltration of the liver and coronary artery disease.  No acute  findings in the pelvis.   ASSESSMENT/PLAN:  1. Gastroparesis.  We will treat with nothing per os, intravenous      proton pump inhibitor and Reglan scheduled around-the-clock plus as      needed pain and nausea medications.  2. Hypokalemia; we will replace.  3. Pneumonia.  Given the fact that the patient is breathing      comfortably well at this time it is possible that this could be      aspiration.  For now will treat as community-acquired pneumonia.  4. Diabetes mellitus.  Nothing per os and sliding scale.      Annita Brod, M.D.  Electronically Signed     SKK/MEDQ  D:  02/12/2008  T:  02/12/2008  Job:  [id number]   cc:   Candace Gallus, M.D.  Fax: [phone]

## 2011-01-18 NOTE — Cardiovascular Report (Signed)
NAMECHANDON, [name]            ACCOUNT NO.:  [id number]   MEDICAL RECORD NO.:  [id number]          PATIENT TYPE:  INP   LOCATION:  2031                         FACILITY:  [facility]   PHYSICIAN:  Jerline Pain, MD      DATE OF BIRTH:  February 17, 1966   DATE OF PROCEDURE:  12/15/2008  DATE OF DISCHARGE:                            CARDIAC CATHETERIZATION   INDICATIONS:  A 45 year old male with ischemic  cardiomyopathy/hypertensive cardiomyopathy with prior percutaneous  interventions to the LAD at the bifurcation of a D2 as well as  percutaneous intervention to D1, last intervention being performed in  2006 with worsening shortness of breath and exertional chest discomfort.  Prior nuclear stress test showed no evidence of ischemia approximately 1  year ago with an ejection fraction of 39% calculated.   PROCEDURES:  1. Left heart catheterization.  2. Selective coronary angiography.  3. Left ventriculogram.   PROCEDURE DETAILS:  Informed consent was obtained.  Risk of stroke,  heart attack, death, bleeding, renal impairment, and arterial damage  were explained to the patient at length.  He was placed on the  catheterization table, prepped in a sterile fashion.  Lidocaine 1% was  used for local anesthesia.  Fluoroscopy was used to visualize the  femoral head.  Modified Seldinger technique was used to selectively  cannulate the right femoral artery.  A Judkins left #4 catheter was used  to selectively cannulate the left main artery and a Judkins right #4  catheter was used to selectively cannulate the right coronary artery.  Multiple views with hand injection of Omnipaque were obtained in  multiple views.  An angled pigtail was then used to cross the aortic  valve in the left ventricle.  A 34 mL of contrast was power injected in  the RAO position for a ventriculogram.  Following procedure, the sheath  was removed after ACT was verified.   FINDINGS:  1. Left main artery - widely patent.  2. Left anterior descending artery - there are two main diagonal      branches, then the left anterior descending artery continues to      wrap around the apex.  The previously placed stents, the first      intervention being the LAD/D2 bifurcation with two kissing stents      are widely patent.  The third intervention being the first diagonal      branch, mid section is also widely patent.  There is approximately      40% mid to distal LAD disease distal to the previously placed      stent, which appears nonobstructive.  There are minor luminal      irregularities throughout these vessels.  3. Circumflex artery - so large caliber vessel giving rise to two      obtuse marginal branches with minor irregularities throughout.  4. Right coronary artery - this is a dominant vessel giving rise to      the PDA.  There is mid 40% stenosis.  Otherwise minor      irregularities throughout.  5. Left ventriculogram - ejection fraction is severely decreased at  approximately 15-20% with akinesis of the entire inferior wall      (prior scar seen on nuclear stress test).  There does not appear to      be any objective evidence of thrombus.  No significant mitral      regurgitation is present.   HEMODYNAMICS:  Left ventricular pressure is 116 with an end-diastolic  pressure of 20.  Aortic pressure 117/84 with a mean of 98 mmHg.   IMPRESSIONS:  1. Widely patent previously placed stents in the left anterior      descending coronary artery/second diagonal branch as well as first      diagonal branch with 40% mid to distal left anterior descending      coronary artery stenosis distal to the previously placed stent.  2. A 40% mid right coronary artery stenosis - non-flow limiting.  3. Severely decreased ejection fraction of approximately 15-20% with      akinesis of the entire inferior wall with otherwise severe global      hypokinesis.  4. Elevated left ventricular end-diastolic pressure.   PLAN:   Continue with aggressive medical management including beta-  blocker, ACE inhibitor, digoxin, isosorbide mononitrate.  I will have  discussion with him regarding defibrillator.  We will increase his  isosorbide mononitrate to 60 mg to hopefully provide increasing  antianginal support.  Compared to prior nuclear stress test  approximately 1 year ago, his ejection fraction has decreased.  Note,  his QRS complex is narrow.      Jerline Pain, MD  Electronically Signed     MCS/MEDQ  D:  12/15/2008  T:  12/16/2008  Job:  [id number]   cc:   Candace Gallus, M.D.

## 2011-01-18 NOTE — Discharge Summary (Signed)
NAMEALEJOS, [name]            ACCOUNT NO.:  [id number]   MEDICAL RECORD NO.:  [id number]          PATIENT TYPE:  INP   LOCATION:  2031                         FACILITY:  [facility]   PHYSICIAN:  Jerline Pain, MD      DATE OF BIRTH:  06-08-1966   DATE OF ADMISSION:  12/12/2008  DATE OF DISCHARGE:  12/16/2008                               DISCHARGE SUMMARY   DISCHARGE DIAGNOSES:  1. Acute on chronic systolic heart failure, acute phase resolved.  2. Ischemic cardiomyopathy, ejection fraction 15-20%.  3. Coronary artery disease.  4. Diabetes mellitus.  5. Hypertension.  6. Dyslipidemia.  7. Long-term anticoagulation with Coumadin with a history of deep vein      thrombosis, lifelong Coumadin.   HOSPITAL COURSE:  Mr. Elza is a 45 year old male patient with a known  ischemic cardiomyopathy who had anterior wall myocardial infarction in  2005, LAD bifurcation intervention in July 2005, circumflex intervention  in 2005 with a repeat diagonal intervention with a Cypher stent in March  2006.   He has been in and out of the clinic over the past 2-3 weeks with  worsening shortness of breath that has been fluctuating.  After being  placed on 80 mg of Lasix p.o. b.i.d., he was better, but his BNP was  still increasing.  He was also complaining of more exertional chest  discomfort, which had not been present previously.  Given this finding,  he was admitted to the hospital.   His cardiac markers were negative, but his BNP was around 1200.  He was  aggressively diuresed with IV Lasix.  Ultimately, he did undergo cardiac  catheterization that showed his EF to be 15-20%.  The LAD had a distal  40% lesion.  All stents were open.  The right coronary artery had a mid  40% RCA lesion.  The LV was dilated with inferior wall akinesis,  therefore cardiac catheterization showed patent stents without any  obstructive disease.  Medical management only.   By December 16, 2008, the patient was in stable,  but improved condition and  we felt it was safe for him to go home.  No weights have been recorded.   LABORATORY STUDIES:  Sodium 139, potassium 4.0, BUN of 16, creatinine  1.01, hemoglobin 12.3, hematocrit 36.7, platelets 225, white count 5.5,  PT 14.2, INR 1.1.   DISCHARGE MEDICATIONS:  1. Nexium 40 mg a day.  2. Potassium as prior to admission.  3. Baby aspirin 81 mg a day.  4. Lantus as before admission.  5. Lisinopril 40 mg a day.  6. Coreg 25 mg twice a day.  7. Lipitor 80 mg a day.  8. Hydralazine 25 mg 1 p.o. t.i.d.  9. Coumadin 7.5 mg a day.  10.Digoxin 0.125 mg a day.  11.Imdur 30 mg a day, he is to take 2 of these.  I went ahead and      wrote a prescription for the 60 mg tablets, which we want him to be      on.  12.Sublingual nitroglycerin p.r.n. chest pain.   He is to remain on a  low-sodium heart-healthy diabetic diet.  Increase  activity slowly.  No lifting over 10 pounds for 1 week.  No driving for  2 days.  Clean cath site gently with soap and water.  No scrubbing.  Follow up with Dr. Marlou Porch on December 25, 2008, at 3:15 p.m.  Follow up  with his primary physician for Coumadin check and for diabetes  management in 1 week.      Joesphine Bare, P.A.      Jerline Pain, MD  Electronically Signed    LB/MEDQ  D:  12/16/2008  T:  12/17/2008  Job:  [id number]   cc:   Candace Gallus, M.D.

## 2011-01-18 NOTE — Discharge Summary (Signed)
[name], [name]            ACCOUNT NO.:  [id number]   MEDICAL RECORD NO.:  [id number]          PATIENT TYPE:  INP   LOCATION:  62                         [facility]ACILITY[facility]  [facility]   PHYSICIAN:  Corinna L. Conley Canal, MDDATE OF BIRTH:  February 05, 1966   DATE OF ADMISSION:  02/09/2008  DATE OF DISCHARGE:  02/11/2008                               DISCHARGE SUMMARY   DISCHARGE DIAGNOSES:  1. Diabetic ketoacidosis, mild.  2. Insulin dependent diabetes.  3. History of noncompliance.  4. History of gastroparesis.  5. Vomiting secondary to above.  6. Recurrent deep venous thrombosis.  7. Supratherapeutic Coumadin.  8. Hyperlipidemia.  9. Inflammatory bowel disease status post right hemicolectomy.  10.Hypertension.  11.Gastroesophageal reflux disease.  12.Coronary artery disease with stent.   DISCHARGE MEDICATIONS:  1. Hold Coumadin tonight and then decrease dose to 5 mg nightly, then      as per Coumadin clinic.  2. Lantus 100 units subcutaneously daily.  3. Lasix 40 mg a day.  4. Lipitor 80 mg a day.  5. Colestipol 1 mg a day.  6. KCl 10 mEq a day.  7. Lisinopril 40 mg a day.  8. Nexium 40 mg a day.  9. Toprol-XL 25 mg a day.  10.Multivitamin a day.  11.Reglan 10 mg every 6 hours as needed for nausea or vomiting.   FOLLOW UP:  Followup with Dr. Maylon Peppers later on this week as previously  scheduled to monitor symptoms as well as INR.  He is being referred to  outpatient diabetic classes.   ACTIVITY:  As previous.   DIET:  Diabetic, low-salt.   CONDITION ON DISCHARGE:  Stable.   PROCEDURES:  None.   CONSULTATIONS:  None.   LABORATORY DATA:  ABG on 2 liters nasal cannula showed a pH of 7.340,  PCO2 47, PO2 118, bicarbonate 28, oxygen saturation 97%, ionized calcium  1.05.  CBC on admission significant for a white blood cell count of  12,000, otherwise unremarkable.  INR on admission was 3.2.  Basic  metabolic panel on admission significant for potassium of 3.1.  Serum  acetone  small.  Hemoglobin A1c was 12.8.  Urinalysis showed greater than  1000 glucose, 40 ketones, moderate blood, 100 protein, negative nitrite,  negative leukocyte esterase, 3-6 red cells, 0-2 white cells, specific  gravity 1.043.   SPECIAL STUDIES/RADIOLOGY:  EKG showed sinus tachycardia with age  indeterminate anteroseptal infarct.  Chest x-ray showed nothing acute.   HISTORY AND HOSPITAL COURSE:  [name] is a 45 year old black male  patient of Dr. Maylon Peppers who presented with a week's worth of vomiting.  He has a history of gastroparesis.  He had skipped his Lantus for  several days and reports that he skips his Lantus whenever he is sick.  He does not check his sugars regularly.  He denied any chest pain.  He  had been having bowel movements.  He had no fevers, chills.  He had  normal vital signs on admission and was in no acute distress.  He had  some mild tenderness of his abdomen, normal bowel sounds, nondistended.  He was found to be in  mild diabetic ketoacidosis, and the vomiting was  felt to be due to this as well as gastroparesis.  He was started on  Reglan, IV insulin, given IV fluids.  His sugars quickly normalized.  He  never had an increased anion gap despite the small serum acetone.  He  has seen an endocrinologist in the past but does not see Dr. Loanne Drilling  regularly.  I have recommended that he go to outpatient diabetes  classes.  I have given a prescription for Reglan to use as needed and  stressed the importance of monitoring blood glucose and taking at least  some Lantus every day.  His Coumadin was held while here, as his INR was  above 3.  He had been on 7 alternating with 8 mg of Coumadin a day, and  I have instructed him to hold his Coumadin for tonight and then decrease  dose to 5 mg.  He has an appointment already set up with Dr. Maylon Peppers on  Friday at which time an INR will be checked.  He is  tolerating a diet and has no nausea, vomiting.  He has normal vital   signs.  They were unable to draw his blood today, but yesterday's blood  draw showed an increased INR but otherwise normal labs.   Total time on the day of discharge is 35 minutes.      Corinna L. Conley Canal, MD  Electronically Signed     CLS/MEDQ  D:  02/11/2008  T:  02/11/2008  Job:  [id number]   cc:   Candace Gallus, M.D.  Fax: [phone]   Sean A. Loanne Drilling, [place]  [place] [address]   David R. [name], MD, FACG, FACP, FAGA  [address]  [place] [address]

## 2011-01-18 NOTE — H&P (Signed)
NAMEGRANTLEY, SAVAGE            ACCOUNT NO.:  [id number]   MEDICAL RECORD NO.:  [id number]          PATIENT TYPE:  INP   LOCATION:  68                         FACILITY:  [facility] [place]   PHYSICIAN:  Thayer Headings, MD    DATE OF BIRTH:  Apr 29, 1966   DATE OF ADMISSION:  02/09/2008  DATE OF DISCHARGE:                              HISTORY & PHYSICAL   CHIEF COMPLAINT:  Nausea and vomiting.   HISTORY OF PRESENT ILLNESS:  This is a 45 year old male with history of  gastroparesis though with no recent symptoms, inflammatory bowel disease  status post a resection, no active disease, and insulin-dependent  diabetes type 2 who presents here with nausea and vomiting for about 1  week.  The patient initially began to have more nausea over the last  week and much less bowel movement which does tend to be typical to his  gastroparesis.  However, he then started having some emesis over the  last 1-2 days.  The patient denies any recent illnesses, fever, sick  contacts, or travel.  The patient also denies any chest pain or any  other issues.   PAST MEDICAL HISTORY:  1. Recurrent DVT, on Coumadin.  2. Diabetes.  3. Hypercholesterolemia.  4. Inflammatory bowel disease.  5. Hypertension.  6. GERD.  7. Coronary artery disease status post stent.   MEDICATIONS:  1. Coumadin.  2. Lantus 100 units daily.  3. Furosemide 20 mg daily.  4. Lipitor 80 mg daily.  5. Colestipol 1 mg daily.  6. KCl 10 mEq daily.  7. Lisinopril 40 mg daily.  8. Nexium 40 mg daily.  9. Toprol-XL 25 mg daily.  10.Multivitamin daily.   ALLERGIES:  No known drug allergies.  There is a report of Augmentin  causing nausea and vomiting.   SOCIAL HISTORY:  Denies any alcohol, tobacco, or drugs.   FAMILY HISTORY:  The patient's father had an MI in his 72s.   REVIEW OF SYSTEMS:  Negative except as per history of present illness.   PHYSICAL EXAM:  VITAL SIGNS:  Temperature is 98.5, pulse is 92,  respirations 20, blood  pressure 132/75, and O2 sat 100%.  GENERAL:  Awake, alert, and oriented x3 and appears in no acute  distress.  HEENT:  No exudates and no LAD.  CARDIOVASCULAR:  Regular rate and rhythm.  LUNGS:  Clear to auscultation bilaterally.  ABDOMEN:  Diffuse mild tenderness, soft with positive normoactive bowel  sounds, no rebound, and no guarding.  EXTREMITIES:  No edema.   LABORATORY DATA:  His UA with positive red blood cells, glucose and  protein, WBC is 12.3, hemoglobin 13, and platelets 325,000.  His sodium  135, potassium 3.1, chloride 101, glucose 449, BUN 17, and creatinine  1.0.  Hemoglobin is 15 and INR is 3.2.  ABG notable for pH 7.34, CO2 47,  CO2 118, and bicarb 25.   PLAN:  1. Mild diabetic ketoacidosis.  The patient is on Glucommander and      will receive IV fluids and see also as he does appear hypovolemic.      We will also hold his  furosemide at this time, for the same reason.      I do anticipate him transitioning quickly over to off of the      Glucommander as he does not have an elevated anion gap.  Although,      he does have ketones in the blood and certainly elevated glucose.  2. Nausea and vomiting.  This could be secondary to the hyperglycemia      episode and we will have the patient reevaluated in a.m. after his      blood sugar has improved.  If he continues to have nausea and      vomiting and we will consider his gastroenterology input regarding      whether or not this could be recurrence of his gastroparesis.  He      currently does not take any medications for gastroparesis.  3. Recurrent DVT.  The patient will continue with his Coumadin.  4. Coronary artery disease.  The patient will continue with his      cardiac medications.      Thayer Headings, MD  Electronically Signed     RWC/MEDQ  D:  02/09/2008  T:  02/10/2008  Job:  [id number]

## 2011-01-18 NOTE — Assessment & Plan Note (Signed)
[facility] OFFICE NOTE   NAME:[name], [name]                   MRN:          [id number]  DATE:09/14/2007                            DOB:          05-Apr-1966    HISTORY:  Mr. 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  The patient was admitted with perforated appendix in  2002 and underwent emergency exploratory surgery by Dr. Deon Pilling.  The  patient temporarily had a colostomy that was reversed a year or so  later.  Previous colonoscopy and small bowel series had shown no  definite evidence of Crohn's disease.  His colostomy was actually  reversed in October of 2003.  Since that time he has had multiple  admissions and problems with diabetic gastroparesis and I believe he has  been evaluated by Dr. Derrill Kay at [facility], although I cannot find  these records.  Additional problems have included a right inguinal  hernia repair and previous narcotic dependency.   I have not seen this patient in several years. His last colonoscopy was  in June of 2002 at which time he had multiple rectal nodules that were  biopsied twice within several months and were felt to be secondary to  mucosal prolapse syndrome.  He previously was followed by Dr. Loanne Drilling  and is currently followed by Dr. Maylon Peppers who took over for Dr. Bubba Hales  in his primary care.  He seems to be doing well from a gastrointestinal  standpoint and denies any nausea and vomiting and has fairly stable  lower problems except he has 6 to 8 bowel movements a day which are  nonbloody and do not seem like it is consistent with steatorrhea.  He  has had no real anorexia or weight loss and denies any specific food  intolerances.  He does not have lactose intolerance and does not use  sorbitol  or fructose.  I previously placed him on some Colestid for what  seemed to be diarrhea related to his previous ileal resection and this  did help some, although he never really followed through with any  increase in his dosages.  He currently denies fever, chills, skin rash,  joint pains, oral stomatitis or other extracolonic manifestations of  inflammatory bowel disease.   PAST MEDICAL HISTORY:  He has insulin-dependent diabetes mellitus and  has had some problems with peripheral neuropathy.  He has hypertensive  cardiovascular disease and also has had history of recurrent deep venous  thrombosis and is on Coumadin and aspirin.  He has also had coronary  artery stenting followed by cardiologists and they have placed a drug-  eluting stent, which requires that his anticoagulation not be  discontinued.  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   MEDICATIONS:  1. Lantus insulin 8 units each morning.  2. Nexium 40 mg daily.  3. Aspirin 81 mg a day.  4. Toprol XL 12.5 mg daily.  5. Lipitor 80 mg a day.  6. Lasix 20 mg a day.  7. Coumadin 7.5 mg a day.  8. Lisinopril 20 mg a day.  9. Potassium replacement.   In the past he has used p.r.n. Zofran.   ALLERGIES:  He denies drug allergies.   FAMILY HISTORY:  Remarkable for diabetes and hypertensive cardiovascular  disease.  He has a sister who has some type of inflammatory bowel  disease.   SOCIAL HISTORY:  He is married, lives with his wife and works as a  Scientist, clinical (histocompatibility and immunogenetics).  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  He does not smoke or  abuse ethanol.   REVIEW OF SYSTEMS:  Otherwise noncontributory except for some night  sweats and chronic fatigue.  He denies current active cardiovascular or  pulmonary complaints or any symptoms of congestive heart failure or  unstable angina.   PHYSICAL EXAMINATION:  GENERAL APPEARANCE:  He is a healthy-appearing  black male in no  acute distress, appearing his stated age.  VITAL SIGNS:  He is 5 feet, 8 inches and weighs 217 pounds.  Blood  pressure 140/100.  Pulse was 84 and regular.  HEENT:  I could not appreciate any stigmata of chronic liver disease.  CHEST:  Generally clear.  HEART:  He appeared to be in a regular rhythm without murmurs, rubs or  gallops.  ABDOMEN:  There was on definite organomegaly, masses, tenderness or  distention.  Bowel sounds were normal.  There is a well-healed midline  abdominal linear scar.  EXTREMITIES:  Peripheral extremities were unremarkable.  NEUROLOGICAL:  Mental status was clear.  RECTUM:  Inspection of the rectum was unremarkable as was the rectal  exam.  There was very hard stool in the rectal vault that was Guaiac +1  positive.   ASSESSMENT:  1. Guaiac positive stools in a 45 year old black male diabetic with      possible history of underlying inflammatory bowel disease.  2. History of mucosal prolapse syndrome with multiple inflammatory      rectal polyps per his previous colonoscopy, which was last done in      June of 2002.  3. Family history of inflammatory bowel disease in his sister.  4. History of diabetic gastroparesis and chronic gastroesophageal      reflux disease.  5. Insulin-dependent diabetes mellitus with peripheral neuropathy.  6. Hypertensive cardiovascular disease, coronary artery disease,      coronary artery stenting.  7. Chronic anticoagulation.  8. 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   RECOMMENDATIONS:  1. Outpatient endoscopy and colonoscopy followup.  2. Check CBC and anemia panel.  3. Trial of Colestid 2 grams in mid A.M. to be adjusted as needed.  4. Continue multiple other medications as listed above.  5. Continue reflux regime and daily Nexium.   ADDENDUM:  It does not seem like this patient is having significant  gastroparesis at this time.  This may need further  evaluation depending  upon his clinical work up and clinical course.     Loralee Pacas. [name], MD, [facility], [place]  Electronically Signed    DRP/MedQ  DD: 09/14/2007  DT: 09/14/2007  Job #: [id number]   cc:   Candace Gallus, M.D.

## 2011-01-18 NOTE — Assessment & Plan Note (Signed)
[place]                                 ON-CALL NOTE   NAME:Laskin, Brendin                     MRN:          [id number]  DATE:02/09/2008                            DOB:          1966/04/03    TELEPHONE NUMBER:  [phone].   Mr. Algernon Huxley calls complaining of nausea and vomiting that started today.  He relates no abdominal pain, change in bowel habits, hematemesis,  melena, hematochezia or fevers.  He states he has a history of Crohn's  disease and has undergone prior bowel resection.  He is on no  medications for Crohn's disease.  I advised him to go [facility] for further evaluation and he states he will do  so.  His wife is with him and will bring him.     Pricilla Riffle. Fuller Plan, MD, [facility]  Electronically Signed    MTS/MedQ  DD: 02/09/2008  DT: 02/10/2008  Job #: [id number]   cc:   Loralee Pacas. [name], MD, [facility], [place]

## 2011-01-21 NOTE — H&P (Signed)
NAMEJARRYN, [name]            ACCOUNT NO.:  [id number]   MEDICAL RECORD NO.:  [id number]          PATIENT TYPE:  EMS   LOCATION:  ED                           FACILITY:  [facility]   PHYSICIAN:  Carey Bullocks. Gertie Exon, MD     DATE OF BIRTH:  [birth year]   DATE OF ADMISSION:  08/20/2006  DATE OF DISCHARGE:                              HISTORY & PHYSICAL   CHIEF COMPLAINT:  Leg pain and swelling.   HISTORY OF PRESENT ILLNESS:  [name] is a 45 year old male with  complicated past medical history significant for diabetes mellitus,  coronary artery disease, hypertension, and a past DVT.  He presents to  the hospital today with 2 or 3 days of pain and swelling in his right  leg.  The pain is mild to moderate while sitting but excruciating while  walking.  He does not recall any recent travel other than one road trip  approximately four days ago.   REVIEW OF SYSTEMS:  Negative for chest pain, diaphoresis, cough, or  dyspnea.  Negative for fever or chills.   PAST MEDICAL HISTORY:  1. Insulin-dependent diabetes mellitus.  2. Hypertension.  3. Coronary artery disease.  4. Myocardial infarction in 2005.  5. Angioplasties in 2005 and 2006.  6. Crohn's disease status post partial colectomy.  7. Deep vein thrombosis in 2004 which was treated with six months of      Coumadin.  8. Hypercholesterolemia.   HOME MEDICATIONS:  1. Lipitor 80 mg daily.  2. Lisinopril 40 mg daily.  3. Actos 40 mg daily.  4. Lantus 60 units daily.  5. Toprol XL 25 mg daily.  6. Plavix 75 mg daily.  7. Aspirin 325 mg daily.   ALLERGIES:  NO KNOWN DRUG ALLERGIES.   SOCIAL HISTORY:  Negative.   PHYSICAL EXAMINATION:  VITAL SIGNS:  Temperature 98.3.  Pulse 74.  Blood  pressure 144/92.  Respiratory rate 20.  Oxygen saturation 96% on room  air.  GENERAL APPEARANCE:  Alert, well-appearing, in no acute distress.  HEENT:  Mucous membranes are moist.  NECK:  No jugular venous distention.  CHEST:  Clear to auscultation  bilaterally.  CARDIOVASCULAR:  Regular.  Normal S1 and S2.  No murmurs, gallops, or  rubs.  ABDOMEN:  Normal bowel sounds.  Soft, nontender, nondistended.  No  organomegaly.  EXTREMITIES:  Tense edema in the right leg from foot up to the mid calf.  Quite tender to palpation.  Left lower extremity is normal.  Pulses are  palpable in both feet.  No pain or masses in the groin.   LABORATORY STUDIES:  1. Sodium 137.  Potassium 3.8.  Chloride 99.  Bicarbonate 29.  Glucose      393.  BUN 9.  Creatinine 0.8.  Calcium 9.0.  Total protein 7.      Albumin 3.2.  2. AST 16.  ALT 16.  Alkaline phosphatase 132.  Total bilirubin 0.9.  3. White blood cell count 8.9.  Hemoglobin 13.8.  Hematocrit 41.      Platelets 195.  Differential 69% polys, 21% lymphocytes, 7%      monocytes,  eosinophils 3%.  4. Coagulation studies pending.  5. Lower extremity Doppler studies were performed.  The formal report      is not available; however, it was reported that the patient has a      DVT in the proximal right femoral and iliac veins.   ASSESSMENT AND PLAN:  This is a 45 year old male with a history of  cardiovascular disease and a history of deep vein thrombosis three years  ago who returns with a new DVT in the proximal vessels of the right leg.   1. Deep vein thrombosis.  The patient will be placed on low molecular      weight Heparin, 1 mg/kg b.i.d.  He will also start on Coumadin to      be adjusted by the pharmacy protocol.  Because this is his second      deep vein thrombosis which might require prolonged or life-long      therapy, I will ask interventional radiology to evaluate him for an      inferior vena cavae filter.  However, this might not be indicated      since the patient can indeed take anticoagulants.  2. Pain control.  I will give the patient Tylenol and Dilaudid to      control pain.  3. Diabetes mellitus and hyperglycemia.  The patient will receive      Lantus and Actos as he does at  home.  I will also start an insulin      sliding scale and check blood glucoses before meals and before bed.  4. Cardiovascular disease, status post stenting.  I will continue the      patient's Plavix and aspirin.  5. Hyperlipidemia.  I will continue the patient's Lipitor.  6. Hypertension.  Continue Lisinopril and Toprol.      Carey Bullocks. Gertie Exon, MD  Electronically Signed     PML/MEDQ  D:  08/20/2006  T:  08/20/2006  Job:  [id number]   cc:   Darletta Moll. Bubba Hales, M.D.  Fax: [phone]

## 2011-01-21 NOTE — Discharge Summary (Signed)
NAME:  [name]                      ACCOUNT NO.:  [id number]   MEDICAL RECORD NO.:  [id number]                   PATIENT TYPE:  INP   LOCATION:  3570                                 FACILITY:  [facility]   PHYSICIAN:  [name], M.D. [facility]             DATE OF BIRTH:  Jun 13, 1966   DATE OF ADMISSION:  07/12/2002  DATE OF DISCHARGE:  07/20/2002                                 DISCHARGE SUMMARY   ADMISSION DIAGNOSES:  57. A 45 year old male with recurrent nausea, vomiting and abdominal pain     felt in the past secondary to gastroparesis now with exacerbation.  2. Insulin dependent diabetes mellitus.  3. Chronic pain syndrome on chronic narcotics.  4. Depression.  5. Status post recent revision of ileostomy with reanastomosis.  6. Status post perforated appendicitis in the summer of 2003 requiring     ileostomy.  7. Questionable diagnosis of Crohn's disease.  8. Apparent history of narcolepsy.   DISCHARGE DIAGNOSES:  1. Acute exacerbation of gastroparesis multifactorial secondary to narcotics     and diabetes improved symptomatically.  2. Abdominal pain resolved with no current concern for intraabdominal     inflammatory process.  3. Depression.  4. Chronic pain syndrome on chronic narcotics.  5. Status post recent revision of ileostomy with reanastomosis.  6. Status post perforated appendicitis in the summer of 2003 requiring     ileostomy.  7. Questionable diagnosis of Crohn's disease.  8. Apparent history of narcolepsy.  9. Hypokalemia, resolved.  10.      Hypomagnesemia, resolved.   CONSULTATIONS:  [name], M.D., psychiatry.   PROCEDURE:  1. Gastric emptying scan.  2. Plain abdominal films.   BRIEF HISTORY:  [name] is a 45 year old African-American male with history as  outlined above. He is currently admitted with suspected exacerbation of  gastroparesis with several day history of worsening nausea, vomiting and  abdominal pain which is intractable at  the time of admission. He has already  been on Zofran and Reglan at home and says he has vomited eight times over  the past 24 hours. His potassium level in the emergency room is 2.4 and he  is complaining of epigastric pain which is typical for him with these  exacerbations. He was just discharged from the hospital on June 27, 2002  after revision of his ileostomy and subsequent prolonged hospitalization due  to nausea and vomiting associated with that. He currently has no complaint  of fever or changes in his bowel habits, no headache or visual changes, has  complained of some weakness. Review of systems was otherwise negative at the  time of admission. This patient has had multiple hospital admissions over  the past year and is readmitted at this time for IV fluid hydration,  antiemetics, prokinetic's and further diagnostic evaluation. Will also plan  to attempt to get him off of narcotics if possible which seems to be  exacerbating his problems.  CURRENT LABS ON ADMISSION:  November 7, WBC of 4.7, hemoglobin 11,  hematocrit of 32.5, MCV of 80, platelets 334, sed rate is 20, sodium 137,  potassium 2.4, BUN 3, creatinine 0.6, albumin 3.4. Serial values were  obtained on November 8, potassium 2.3, on November 10, potassium 4.3.  Glucose is 188 on admission, thereafter 137, 109, and 92 with a.m. fasting  values. Albumin 3.4 on admission, liver function studies normal. Magnesium  1.3 on admission, follow-up on November 10, 1.5. Lipase was 15 on admission,  phosphorus 2.7. Prealbumin 24.7 Urinalysis negative.   X-RAY STUDIES:  Plain abdominal films on admission showed no abnormality  other than possible mild ileus and gastric emptying scan on November 13  showed interval improvement when compared with the last emptying scan done  in May of 2003 now showing 47% retention at 2 hours with a normal value  being less than 30.   HOSPITAL COURSE:  The patient was admitted to the service of  Dr. Silvano [name] who was covering the hospital. He was initially placed on IV fluids,  sliding scale insulin coverage, IV Zofran, p.r.n. morphine, IV Reglan at 20  mg IV q. 6h and also started on Zelnorm this admission. He was hypokalemic  and hypomagnesemic and these values were corrected. The patient had been  discharged from the hospital his last admission on a fentanyl patch at 25  mcg q. 3 days and had also been using Vicodin as needed at home for  discomfort. We felt that his current problem was related to gastroparesis  both from his diabetes and obviously being worsened by chronic narcotic use.  We discussed discontinuing the narcotics with the patient which he was  initially reluctant to do but ultimately agreeable. By November 10, he had  had a set back again with vomiting. We had attempted to advance his diet  which was unsuccessful and then went back to IV medications. We also started  him on small doses of Ativan which seemed to help both his anxiety and his  nausea. By November 11, we started advancing his diet again, discontinued  his fentanyl patch and gave him sparing doses of Vicodin. He did fine with  coming off of the fentanyl. He actually complained of less abdominal pain  with time while being in the hospital and seemed to feel better with the use  of Ativan. A gastric emptying scan was repeated on November 13 for  comparison sake as we were also considering referral to a tertiary care  center for refractory gastroparesis; however, off of narcotics and on Reglan  and Zelnorm the gastric emptying scan did show significant improvement. It  was not felt that this level of gastroparesis should cause him to have pain  or repeated vomited. We then advanced his diet again, converted his  medications to p.o., had the dietician instruct him in a gastroparesis diet with diabetic restrictions. Also decreased his Remeron and then had some  concern that his antidepressants were  not being effective and/or possibly  complicating his complaints of nausea and vomiting. He was seen by Dr.  Rhona [name] in consult on November 15. He was started on Effexor XR at 37.5 mg  q.d. with plans to increase to 75 after one week. Pindolol 2.5 mg t.i.d. and  his Remeron was increased to 30 q.h.s. and he was also started on Restoril  30 q.h.s. p.r.n. He actually requested discharge to home later that day on  November 15 and was discharged  in a stable condition with plans to follow-up  with Dr. [name] in a couple of weeks as an outpatient. We also  established that his primary care physician should be Dr. Bubba Hales at [facility] as [name] is now a [facility] patient. He was  made an appointment with Dr. Bubba Hales to establish.   CONDITION ON DISCHARGE:  Stable.   MEDICATIONS:  1. Metoprolol 50 q.d.  2. Zelnorm 6 mg b.i.d.  3. Protonix 40 mg b.i.d.  4. Seroquel 50 q.h.s.  5. Lisinopril 80 q.d.  6. Metoclopramide 10 a.c. and h.s.  7. Remeron 30 mg q.h.s.  8. Effexor XR 37.5 p.o. q.d. for one week and then increase to 75 q.d.  9. Ativan 1 mg t.i.d. p.r.n.  10.      Dr. Rhona [name] has started the Restoril 30 q.h.s. p.r.n.  11.      Pindolol 2.5 t.i.d.  12.      Lantus insulin 38 units q.h.s.  13.      Actos 45 mg p.o. q.d.     Amy Esterwood, P.A.-C. [place]. Henrene [name], M.D. [facility]    AE/MEDQ  D:  07/24/2002  T:  07/24/2002  Job:  [id number]   cc:   Darletta Moll. Bubba Hales, M.D.  [name]  [place]  [place] [address]  Fax: [phone]

## 2011-01-21 NOTE — H&P (Signed)
[facility]  Patient:    [name], [name] Visit Number: [id number] MRN: [id number]          Service Type: EMS Location: ED Attending Physician:  Ross Ludwig. Dictated by:   Alfredia Ferguson, P.A. Admit Date:  01/22/2002 Discharge Date: 01/22/2002                           History and Physical  CHIEF COMPLAINT:  Intractable nausea and vomiting and periumbilical pain.  HISTORY OF PRESENT ILLNESS:  The patient is a 45 year old African-American male known to Dr. Verl Blalock and also to Dr. Loanne Drilling.  The patient was just admitted to [facility] a week ago, Jan 15, 2002, and was discharged on Jan 16, 2002, at that time also with nausea, vomiting, and complaints of abdominal pain which had resolved within 24 hours of admission.  The patient does have a history of insulin-dependent diabetes mellitus, hypertension, hyperlipidemia, and prior right inguinal hernia repair.  He had undergone GI evaluation approximately a year ago for complaints of hematochezia and epigastric pain including EGD which was negative, upper abdominal ultrasound which was negative, small-bowel follow-through which was normal, and then a sigmoidoscopy in April 2002, which showed some abnormal rectal mucosa concerning for a possible tumor.  Colonoscopy was then done in June 2002, showing multiple inflammatory-appearing rectal polyps which extended into the anal canal.  Multiple biopsies were done.  These showed benign inflammatory polyps.  No adenomatous or malignant changes.  The patient also had IBD markers sent because of the atypical findings, and these returned positive with a positive ASCA consistent with Crohns.  Dr. [name] had reviewed his pathology and felt that he probably did have rectal Crohns, and he was started on Pentasa 750 t.i.d. and Canasa suppositories.  GI had not seen the patient since that time until he presented to the emergency room last week with  nausea and vomiting.  At that time he had stated that he had onset two weeks prior with nausea which had been intermittent, lasting up to several hours, and then proceeded to nausea and vomiting for two to three days prior to coming to the emergency room.  Initial evaluation in the emergency room was negative with the exception of a blood glucose at 291.  The patient then began complaining of abdominal pain, and GI was consulted.  He was admitted overnight for further diagnostic evaluation.  He underwent CT scan of the abdomen and pelvis which was negative for any acute findings.  There was no evidence for active inflammatory bowel disease.  He also underwent upper endoscopy per Dr. Delfin Edis, which was normal.  CLOtesting was done.  I do not have the results of that at the time of this dictation.  The patients symptoms improved, and he was allowed discharge to home on Jan 16, 2002.  We also had checked a hemoglobin A1C, which came back at 12.8, confirming very poor control of his diabetes in addition.  He was then scheduled for outpatient follow-up with Dr. Loanne Drilling on Jan 21, 2002.  The patient says he kept his appointment with Dr. Loanne Drilling on Monday.  His insulin regimen was switched to Lantus 120 units q.h.s.  The patient says that since discharge from the hospital last week he said he did okay for a couple of days and then began with recurrent nausea and vomiting which has gotten intractable at this point.  He says he is unable to  keep down any p.o. and had been unable to keep down his medicines over the past couple of days.  He had not had any documented fever at home but does feel that he has had some chills.  He has not had any bowel movement over the past few days.  He also had gone for abdominal pain small-bowel follow-through yesterday which, on preliminary report, did not show any specific abnormality in the small bowel, but did not show the barium to empty into his colon.  The  patient then presented to the emergency room last night because of intractable vomiting. He was seen and evaluated by Dr. Deatra Ina and admitted for hydration, antiemetics, and further diagnostic workup.  CURRENT MEDICATIONS: 1. Toprol XL 50 1 p.o. q.d. 2. Lipitor 80 p.o. q.d. 3. Lantus insulin 120 units q.h.s. 4. Provigil 200 mg p.o. q.d. 5. Nexium 40 mg p.o. q.d. 6. Phenergan 25 mg q.6h. p.r.n. 7. Pentasa 250 mg 3 p.o. t.i.d. 8. Ritalin 10 mg p.o. q.d.  ALLERGIES:  No known drug allergies.  PAST MEDICAL HISTORY:  As outlined above.  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  FAMILY HISTORY:  Pertinent for mother with colon polyps.  No family history of inflammatory bowel disease.  SOCIAL HISTORY:  The patient is married.  He is employed.  He is a nonsmoker and denies any use of ETOH.  REVIEW OF SYSTEMS:  Reviewed and negative other than GI as outlined above.  PHYSICAL EXAMINATION:  Per Dr. Deatra Ina:  GENERAL:  The patient is a well-developed African-American male, uncomfortable due to nausea and vomiting but in no acute distress.  VITAL SIGNS:  Blood pressure 146/83, pulse 80, temperature 100.3.  HEENT:  Anicteric.  Buccal mucosa is moist.  CHEST:  Clear to A&P.  CARDIOVASCULAR:  Regular rate and rhythm with S1 and S2.  No murmur, rub, or gallop.  ABDOMEN:  Soft.  Bowel sounds are hypoactive.  He has subjective tenderness periumbilically.  No palpable mass or hepatosplenomegaly.  No guarding or rebound.  RECTAL:  Not done.  LABORATORY DATA:  On admission WBC was 7.6, hemoglobin 14.4, hematocrit 42.9, platelets 284.  Protime 13.3, INR 1.0, PTT 27.  Sodium 135, potassium 3.4, glucose 272, BUN 5, creatinine 0.8.  Liver function studies entirely normal on Jan 22, 2002.  Amylase and lipase normal.  Urinalysis on Jan 22, 2002, was unremarkable with the exception of a glucose at 500 and was not repeated.  IMPRESSION: 1. The  patient is a 45 year old African-American male with intractable nausea     and vomiting and periumbilical discomfort:  Clearly with hypomotility by    small-bowel follow-through done earlier today.  Rule out symptoms secondary    to severe diabetic gastroparesis.  No evidence for obstruction, and unclear    at this time whether he has any active inflammatory bowel disease. 2. Fever:  Etiology not clear. 3. Tentative diagnosis of Crohns colitis. 4. Hypertension. 5. Diabetes mellitus, poorly controlled. 6. Hyperlipidemia.  PLAN:  The patient is admitted to the service of Dr. Erskine Emery for IV fluid hydration, antiemetics, IV metoclopramide.  Will check gastric emptying scan in the a.m. and then decide on need for repeat colonoscopy to complete his GI evaluation based on findings of gastric emptying scan.  He will be placed on sliding scale insulin coverage, and will ask internal medicine to see him and follow him for diabetic management as well. Dictated by:   Alfredia Ferguson, P.A.  Attending Physician:  Ross Ludwig DD:  01/24/02 TD:  01/25/02 Job: [id number]

## 2011-01-21 NOTE — Discharge Summary (Signed)
[facility]  Patient:    [name], [name] Visit Number: [id number] MRN: [id number]          Service Type: MED Location: [phone] Attending Physician:  Rosezetta Schlatter Dictated by:   Heinz Knuckles. Norins, M.D. [facility] Admit Date:  01/23/2002 Discharge Date: 01/29/2002                             Discharge Summary  ADMITTING DIAGNOSES:  Nausea and vomiting.  DISCHARGE DIAGNOSES:  1. Gastroparesis.  2. Hypertension.  3. Insulin dependent diabetes.  4. Hyperlipidemia.  CONSULTATIONS:  Dr. Erskine Emery for GI which was admitting service.  PROCEDURES:  1. Gastric emptying scan Jan 24, 2002 which revealed marked abnormality     with 100% residual in the stomach at two hours.  2. Ultrasound of the abdomen Jan 25, 2002 which revealed fatty infiltration     of the liver. No evidence of gallstones. No other abnormality noted.  HISTORY OF PRESENT ILLNESS:  [name] is a 45 year old black male followed by Dr. [name] for GI, Dr. Ellene Route for endocrinology. He had been admitted May 13 and was discharged May 14 with nausea and vomiting. The patient has undergone GI evaluation in the past for hematochezia. He has had EGD that was negative, upper abdominal ultrasound which was negative. He had a small bowel follow-through which was normal. He had a flexible sigmoidoscopy in April of 2002 which showed abnormal rectal mucosa. Colonoscopy done in June showing multiple inflammatory appearing rectal polyps. Multiple biopsies were performed showing benign inflammatory polyps with no adenomatous or malignant change. The patient also had IBD markers and these returned positive with positive ASCA consistent with Crohns. The patient has been on pentasa. The patient presented on the day of admission because of significant abdominal discomfort, nausea and vomiting. He had a CT scan of the abdomen and pelvis which were negative. The patient had a repeat upper  endoscopy which was negative at the prior admission. He was discharged home as noted on the 14th. On the day of admission, he presented with persistent nausea and vomiting. He was subsequently admitted for evaluation. Please see his H&P for past medical history, family history, and social history.  MEDICATIONS ON ADMISSION:  1. Toprol XL 50 one p.o. q.d.  2. Lipitor 80 mg q.d.  3. Lantus 120 units q.h.s.  4. Provigil 200 mg q.d.  5. Nexium 40 mg daily.  6. Phenergan 25 mg daily.  7. Pentasa 250 three tablets t.i.d.  8. Ritalin 10 mg daily.  HOSPITAL COURSE: #1 - The patient was admitted to a regular medical floor. He had studies as noted above. He was found to have severe gastroparesis. He was started on IV Reglan and Phenergan. Over the next several days, the patient improved. He was able to take a diet by the time of discharge dictation and was on p.o. Reglan which will be continued.  #2 - IDDM.  The patient had been restarted on Lantus and it had been advanced to 100 units q.h.s. His diet is still substandard for him and therefore his Lantus has not been increased to his normal 120 dose. The patient did have some episodes of hypoglycemia but generally is well controlled based on his CBGs in the chart.  PLAN:  1. The patient to continue on Lantus 100 units q.h.s. He is to follow-up     with Dr. Renato Shin for further adjustments.  2.  He is to adhere to a diabetic diet.  3. Hypertension. The patient was suboptimally controlled. He was on Accupril     at home. Added hydrochlorothiazide to his regimen with good results.  DISCHARGE EXAMINATION:  VITAL SIGNS:  The patient is afebrile. Blood pressure 134/83. His CBGs were 110, 101, 109.  GENERAL:  This is a well-nourished gentleman lying in bed. He is in no acute distress. He feels well.  No further examination conducted.  DISPOSITION:  The patient is discharged home. He is to see Dr. [name] for GI follow-up in June.  He is to see Dr. Loanne Drilling in follow-up in one to two weeks.  DISCHARGE MEDICATIONS:  1. The patient will continue all of his home medications including Accupril     which was not initially listed and hydrochlorothiazide.  2. He will be continued on Nexium.  3. He will be continued on p.o. Reglan 10 mg a.c. and h.s.  CONDITION ON DISCHARGE AT TIME OF DICTATION:  Stable and improved. Dictated by:   Heinz Knuckles. Norins, M.D. [place] Attending Physician:  Rosezetta Schlatter DD:  01/29/02 TD:  01/31/02 Job: [id number]

## 2011-01-21 NOTE — Discharge Summary (Signed)
[name], [name]                        ACCOUNT NO.:  [id number]   MEDICAL RECORD NO.:  [id number]                   PATIENT TYPE:  INP   LOCATION:  8841                                 FACILITY:  [facility]   PHYSICIAN:  [name], M.D. [facility]          DATE OF BIRTH:  1966/03/23   DATE OF ADMISSION:  05/11/2002  DATE OF DISCHARGE:  05/20/2002                                 DISCHARGE SUMMARY   DISCHARGE DIAGNOSES:  1. Abdominal pain with nausea, vomiting, controlled with medications.  2. Type 2 diabetes with severe gastroparesis.  3. Crohn's disease with ileostomy.  4. Hypertension.  5. History of pelvic abscess June 2003, resolved.  6. Major depression.   DISCHARGE MEDICATIONS:  1. Remeron 7.5 mg p.o. b.i.d.  2. Fentanyl 50 mcg patch changed q.Saturday.  3. Zofran 4 mg one to two tablet p.o. q.6h. p.r.n.  4. Vicodin 5/500 one to two tablets p.o. q.6h. p.r.n. pain.  5. Lantus 20 units q.h.s.  6. Reglan 10 mg p.o. t.i.d. q.a.c.  7. Pentasa 250 mg three tablets t.i.d.  8. Nexium 40 mg one p.o. q.d.  9. Actos 45 mg one p.o. q.d.  10.      Accupril 40 mg one p.o. q.d.  11.      Toprol XL 50 mg one p.o. q.d.  12.      Hydrochlorothiazide one p.o. q.d.  13.      Ritalin 10 mg one p.o. q.d.   HOSPITAL FOLLOWUP:  Arranged by patient to be seen by his primary care  physician, Dr. Loanne Drilling, his surgeon, Dr. Deon Pilling, and Dr. Verl Blalock,  his gastroenterologist.  He will also be seen at [facility] for follow-up on his depression medications and psychological  counseling.  This is to be arranged by his case manager prior to discharge  today.   DISPOSITION:  The patient is discharged home in improved condition.  He will  be followed by [place].  Assistance three times weekly as  previously arranged.   CONSULTS:  Dr. Felizardo Hoffmann of [place].   HISTORY OF PRESENT ILLNESS:  The patient is a 45 year old black man with  Crohn's disease and type 2 diabetes with severe gastroparesis who has been  admitted on multiple occasions since June 2003.  This is his third admission  for recurrent abdominal pain, nausea, and vomiting.  He had just been  discharged home 72 hours prior to presentation to the ER where he complained  of recurrent nausea, vomiting, and pain.  He was admitted for medication  management of these issues.    HOSPITAL COURSE:  1. ABDOMINAL PAIN, NAUSEA, AND VOMITING:  The patient was again treated with     high doses of IV Zofran and Demerol and made n.p.o.  The patient soon     became hungry and was advanced slowly through dietary phases.  On the     weekend  prior to discharge patient was placed on a fentanyl 50 mcg patch     which seemed to improve his overall pain control as he is now tolerating     a regular diabetic diet and his p.o. medications for nausea and pain     management.  He is discharged home on this current regimen.  As before,     no clear etiology has been found for his abdominal pain, nausea and     vomiting, though it is felt that patient has some psychological issues     regarding his new ostomy which is to be reversed in early October 2003.  2. DEPRESSION, NEW DIAGNOSIS:  As stated above, because of concern for     psychological issues regarding patient's perception of pain and his     chronic diseases, he was seen by Dr. Rhona Raider during his     hospitalization.  The patient was begun on Remeron 7.5 mg q.d. increased     to b.i.d. at the time of discharge.  The patient is enthusiastic about     this and agrees to follow up as per Dr. Daylene Katayama recommendations as an     outpatient with [facility] for further medication and     psychotherapy counseling.  3. CROHN'S DISEASE WITH ILEOSTOMY:  The patient's disease remained well     controlled on his maintenance medication of Pentasa.  His surgeon, Dr.     Deon Pilling, was consulted regarding possible early reversal of  his ileostomy     but because of issues related to healing, Dr. Deon Pilling has elected to     continue to wait until early October to proceed with the reversal of his     ileostomy.  The patient will follow up with Dr. Deon Pilling and his     gastroenterologist on an outpatient basis as previously scheduled.  4. TYPE 2 DIABETES WITH SEVERE GASTROPARESIS:  The patient suffered several     minor hypoglycemic episodes and his Lantus dose was decreased.  The     patient will follow up with his primary care physician, Dr. Loanne Drilling, for     further adjustment and management of his diabetes.  He will continue     Actos and Lantus as previously scheduled.  He also continues Reglan for     his gastroparesis.  5. HYPERTENSION:  This remains well controlled during his hospitalization.     He continues his Accupril and Toprol as previously prescribed prior to     admission.                                               [name], M.D. [facility]    VL/MEDQ  D:  05/20/2002  T:  05/20/2002  Job:  [id number]   cc:   Hilliard Clark A. Loanne Drilling, M.D. [facility]   Loralee Pacas. [name], M.D. [facility]   Bluford Main., M.D.  Fax: [phone]

## 2011-01-21 NOTE — Consult Note (Signed)
NAMEKELTEN, [name]                      ACCOUNT NO.:  [id number]   MEDICAL RECORD NO.:  [id number]                   PATIENT TYPE:  INP   LOCATION:  6195                                 FACILITY:  [facility]   PHYSICIAN:  Jerelene Redden, MD                   DATE OF BIRTH:  1966/07/01   DATE OF CONSULTATION:  07/27/2002  DATE OF DISCHARGE:                                   CONSULTATION   HISTORY OF PRESENT ILLNESS:  We were asked to see the patient to assess with  his medical management.   PROBLEMS:  Problem 1:  Diabetes.  The patient was originally diagnosed with  diabetes in 1990, after he was found to be hyperglycemic on an insurance  physical.  His blood sugars are generally well regulated.  He manages his  diabetes by taking 30 units of Lantus insulin on a daily basis and  supplementing this with Actos 45 mg daily.  He denies any history of  ketoacidosis.  He says that he has no signs of diabetic retinopathy.  He  indicates that he has chronic burning and tingling of his feet.  He denies  urinary frequency or nocturia.  The patient has received treatment over the  last couple of years for chronic nausea and vomiting which are possibly  secondary to gastroparesis.  In the hospital it is very impressive that his  blood sugars have been well regulated.   Problem 2:  Hypertension.  The patient believes that he has a history of  hypertension, which also dates back to 51.  He is very compliant with his  medications.  He indicates that he has no history of congestive heart  failure, orthopnea, PND, or ankle edema.  He has not experienced any  exertional chest pain.  There is no history of stroke.  In the hospital  setting his blood pressure seems to fluctuate in proportion to complaints of  pain.  Medications are as outlined below.   Problem 3:  Hyperlipidemia.  The patient was previously diagnosed with  hyperlipidemia and had formerly been taking Lipitor 80 mg daily.  I do  not  think that he is currently receiving any cholesterol-lower medication.  He  suspects that this medication was discontinued during one of his many  hospitalizations.   Problem 4:  Possible inflammatory bowel disease.  The patient first was  evaluated by physicians for abdominal complaints in April 2002.  At that  time, apparently he was complaining of hematochezia and intermittent  epigastric pain.  He underwent an upper endoscopy which was unremarkable, an  abdominal ultrasound which was negative, a small-bowel follow-through which  was normal, and then had a sigmoidoscopy in April 2002, and was found to  have abnormal rectal mucosa.  A colonoscopy was done as well which showed  multiple inflammatory rectal polyps extending to the anal canal.  These were  benign inflammatory polyps.  Serologic studies were done, and ultimately, I  believe, it was concluded that the patient had rectal Crohn's.  Originally  the patient was placed on Pentasa 750 mg t.i.d.  Then the patient started  being admitted very frequently for complaints of nausea, vomiting, and  abdominal pain.  His first admission was in May.  On March 14, 2002,  ultimately, the patient was admitted after a CT scan revealed evidence of a  ruptured viscus.  Abscess formation was noted.  The patient was brought to  the operating room where he was found to have evidence of peritonitis with  abscess formation.  It was thought that probably he had a gangrenous  perforated appendicitis.  A right colectomy with ileostomy was performed.  A  CT scan was done at the time of discharge which showed that the abscess had  resolved.  Subsequently, the ileostomy has been reversed.  This was done in  October.  The patient has been weaned off of pain medication.  Unfortunately, he continues to experience recurrent nausea and vomiting.  It  is unclear whether this is secondary to gastroparesis or somehow related to  his diagnosis of inflammatory  bowel disease.   ALLERGIES:  AUGMENTIN.   ADMISSION MEDICATIONS:  1. Metoprolol 50 mg daily.  2. Zelnorm 6 mg b.i.d.  3. Protonix 40 mg daily.  4. Seroquel 50 mg at bedtime daily.  5. Lisinopril 80 mg daily.  6. Remeron 30 mg daily.  7. Effexor 37.5 mg daily.  8. Pindolol 2.5 mg t.i.d.  9. Reglan 20 mg q.i.d.  10.      Ativan 1 mg t.i.d. p.r.n. for anxiety.  11.      Lantus insulin 38 units at bedtime.  12.      Actos 45 units daily.   PAST SURGICAL HISTORY:  The patient has had an inguinal hernia repair in  addition to the above-mentioned colon resection, drainage of abscess, and  revision of ileostomy.   PAST MEDICAL HISTORY:  The patient indicates that he has no history of any  other active health problems.  He states that he has no history of  pneumonia, kidney problems, congestive heart failure, coronary artery  disease, etc.   FAMILY HISTORY:  Remarkable for a strong family history of coronary artery  disease.   SOCIAL HISTORY:  The patient is married.  He is currently on disability  secondary to abdominal pain and chronic vomiting.  He does not abuse alcohol  or drugs.   REVIEW OF SYSTEMS:  HEAD:  He denies headache or dizziness.  EYES:  He  denies visual blurring or diplopia.  EARS, NOSE, THROAT:  He denies earache,  sinus pain, or sore throat.  CHEST:  He denies coughing, wheezing, or chest  congestion.  CARDIOVASCULAR:  He denies orthopnea, PND, or ankle edema.  GASTROINTESTINAL:  See above.  GENITOURINARY:  He denies dysuria, urinary  frequency, hesitancy, or nocturia.  RHEUMATOLOGIC:  He denies back or  degenerative joint pain.  HEMATOLOGIC:  He denies easy bleeding or bruising.  NEUROLOGIC:  Denies history of seizure or stroke.   PHYSICAL EXAMINATION:  GENERAL:  This is a very depressed, downcast-  appearing man who is cooperative.  HEENT:  Within normal limits.  CHEST:  Clear.  CARDIOVASCULAR:  Reveals normal S1, S2.  Without rubs, murmurs, or  gallops. ABDOMEN:  Soft.  Bowel sounds are present.  There is no focal tenderness.  No guarding or rebound.  NEUROLOGIC:  Examination of the extremities  is normal.   IMPRESSION:  1. Diabetes, possibly complicated by gastroparesis.  2. Hypertension.  3. Hyperlipidemia.  4. History of Crohn's disease.  5. History of ruptured appendix with abscess formation, status post right     colon resection, status post revision of ileostomy.  6. Chronic depression.  7. History of narcolepsy.  8. An 80-pound weight loss.    PLAN:  I will be happy to follow this patient with you.  I think we ought to  reassess his lipid profile and possibly get him back on cholesterol-lowering  medication.  I would also suggest that we evaluate thyroid functions.  His  blood sugar seems to be very well regulated.  For now, I would continue the  current regimen.  Ultimately, I would resume his usual outpatient  management.                                               Jerelene Redden, MD    SY/MEDQ  D:  07/27/2002  T:  07/27/2002  Job:  [id number]   cc:   Ermalene Searing. Philip Aspen, M.D.  [address]  [place]  [place] [address]  Fax: [place]. Bubba Hales, M.D.  [address]  [place]  [place] [address]  Fax: [phone]

## 2011-01-21 NOTE — H&P (Signed)
[facility]  Patient:    [name], [name] Visit Number: [id number] MRN: [id number]          Service Type: SUR Location: 4W [place] 01 Attending Physician:  Tiffany Kocher Dictated by:   Ward Givens, M.D. Admit Date:  02/12/2002                           History and Physical  CHIEF COMPLAINT:  Abdominal pain.  HISTORY OF PRESENT ILLNESS:  The patient is a 45 year old black male with a diagnosis of Crohns disease, who has had intermittent abdominal pain for a month or so and for the past week has had increasingly severe abdominal pain. He is admitted to the hospital after undergoing CT scan showing evidence of perforated viscus.  There was also an evident abscess in the right side of the abdomen and a good deal of free abdominal fluid.  He was admitted and prepared for surgery.  PAST MEDICAL HISTORY: 1. The patient has diabetes which is insulin-requiring, and he takes Lantus    insulin 120 units at night. 2. He also has hypertension. 3. Hyperlipidemia. 4. As mentioned, Crohns disease.  He is only on Pentasa for the Crohns.  OTHER MEDICINES: 1. Accupril 40 mg a day. 2. Toprol XL 150 mg daily. 3. Nexium. 4. Lantus 100 units at night. 5. Reglan 10 mg a day for gastroparesis. 6. Actos 45 mg a day. 7. Pentasa 750 mg three times a day. 8. Hydrochlorothiazide 12.5 mg daily.  He is on no other nonprescription medicines.  ALLERGIES:  No known drug allergies.  PAST SURGERY:  No abdominal surgery.  The patient is followed by Dr. Loanne Drilling for his medical problems and by Dr. Philip Aspen for his Crohns disease.  This is based on colonoscopic biopsies. See the included previous records for more details of his history.  The patient has a history of narcolepsy and history of peripheral neuropathy felt to be due to diabetes.  FAMILY HISTORY:  Generally unremarkable.  His mother had some colon polyps.  SOCIAL HISTORY:  The patient does not smoke  or drink.  REVIEW OF SYSTEMS:  Otherwise unremarkable.  PHYSICAL EXAMINATION:  GENERAL:  The patient was quite ill in appearance.  VITAL SIGNS:  Showed tachycardia.  Blood pressure was normal.  MENTAL STATUS:  Normal.  HEENT/NECK:  Unremarkable.  CHEST:  Clear to auscultation.  HEART:  Rate rapid and regular.  No murmur or gallop noted.  ABDOMEN:  Diffusely tender, greatest in the right side of the abdomen.  No mass or hernia noted.  RECTAL:  Unremarkable.  No blood present.  GENITALIA:  Normal.  EXTREMITIES:  Normal.  SKIN:  Normal.  LYMPH NODES:  None enlarged.  NEUROLOGICAL:  Grossly normal.  IMPRESSION: 1. Acute abdomen with perforated viscus and peritonitis. 2. Diabetes mellitus, insulin-requiring. 3. Hypertension. 4. Hyperlipidemia. 5. Diabetic neuropathy. 6. Diabetic gastroparesis.  PLAN:  Fluid and antibiotic resuscitation, then urgent laparotomy.  I also will obtain medical consultation. Dictated by:   Ward Givens, M.D. Attending Physician:  Tiffany Kocher DD:  02/15/02 TD:  02/17/02 Job: [id number]

## 2011-01-21 NOTE — H&P (Signed)
[name], [name]                        ACCOUNT NO.:  [id number]   MEDICAL RECORD NO.:  [id number]                   PATIENT TYPE:   LOCATION:                                       FACILITY:   PHYSICIAN:  Biagio Borg, M.D. [facility]             DATE OF BIRTH:   DATE OF ADMISSION:  DATE OF DISCHARGE:                                HISTORY & PHYSICAL   CHIEF COMPLAINT:  Abdominal pain with nausea.   HISTORY OF PRESENT ILLNESS:  The patient is a 45 year old year old black male here  with an acute onset of excruciating abdominal pain of the right lower  quadrant to epigastric region approximately 7 a.m. today with nausea.  There  is no vomiting or fever.  He was just hospitalized August 15 through April 25, 2002, with similar symptoms.  CT negative.   PAST MEDICAL HISTORY:  1. History of Crohn's disease, status post appendiceal rupture, status post     partial colectomy and abscess to the right lower quadrant.  2. Hypertension.  3. Diabetes with gastroparesis.  4. Hypercholesterolemia.  5. History of ADHD.  6. Obesity.  7. Status post right inguinal hernia repair.   ALLERGIES:  AUGMENTIN.   CURRENT MEDICATIONS:  1. Accupril 4 mg b.i.d.  2. Lantus 15 mg q.d.  3. Reglan 10 mg a.c. and q.h.s.  4. Ambien 10 mg q.h.s.   SOCIAL HISTORY:  No tobacco.   FAMILY HISTORY:  Positive for colon polyps and colitis.   PHYSICAL EXAMINATION:  GENERAL:  The patient is a 45 year old year old black male.  Nontoxic but in severe pain; cannot lie still.  VITAL SIGNS:  Blood pressure 124/80, respirations 20, pulse 64, temperature  97.0.  HEENT:  Sclerae clear.  TMs clear.  Oropharynx benign.  NECK:  Without lymphadenopathy, JVD, thyromegaly.  CHEST:  __________ rales, wheezing.  CARDIAC:  Regular rate and rhythm.  No murmur.  ABDOMEN:  Soft but severe tenderness of the epigastrium to right lower  quadrant, with decreased bowel sounds.  EXTREMITIES:  No edema.    IMPRESSION:  1. Abdominal pain:  He  was treated with Demerol 50 and Phenergan 25 mg given     in the office x 1.  It is unclear etiology with very complicated history.     He is being admitted.  Will hold the Reglan.  Treat by making n.p.o.     Give IV fluids.  Will need pain control, antiemetics.  Will check CBC,     CMET, UA, C&S, and amylase.  Need to consider repeat CT.  Will have     general surgery see in consult.  2. Diabetes mellitus:  Check CBGs and apply sliding scale insulin.  3. Hypertension:  Given blood pressure, will hold medications for now.  4. Other medical problems as above, otherwise stable.  Biagio Borg, M.D. [facility]    JWJ/MEDQ  D:  08/08/2002  T:  08/08/2002  Job:  [id number]

## 2011-01-21 NOTE — Cardiovascular Report (Signed)
NAMEDERALD, LORGE            ACCOUNT NO.:  [id number]   MEDICAL RECORD NO.:  [id number]          PATIENT TYPE:  INP   LOCATION:  0973                         FACILITY:  [facility]   PHYSICIAN:  Bryson Dames, M.D.DATE OF BIRTH:  Jan 04, 1966   DATE OF PROCEDURE:  DATE OF DISCHARGE:  11/18/2004                              CARDIAC CATHETERIZATION   PROCEDURES PERFORMED:  1.  Left coronary angiography.  2.  Direct percutaneous coronary stenting of the mid diagonal branch #1 of      the left anterior descending coronary artery.  3.  Angio-Seal arteriotomy right femoral artery.   COMPLICATIONS:  One.   ENTRY SITE:  Right femoral.   DYE USED:  Omnipaque.   PATIENT PROFILE:  The patient is a 45 year old diabetic hypertensive African-  American gentleman who is a patient of Dr. Terance Ice who has  previously had intracoronary artery stenting to the proximal diagonal branch  #2 and to mid-LAD. He entered the hospital with acute coronary syndrome  earlier this week; and had a diagnostic cardiac catheterization, yesterday,  that showed patency of both stents and the only new lesion in his coronary  vasculature was an 80-90% stenosis of the midportion of the diagonal branch  #1.  That catheterization confirmed those findings, yesterday, and I  conferred with colleagues during the post catheterization and talked to the  patient and decided to bring him back today for that percutaneous procedure  to be done.  It was done without complications.   DESCRIPTION OF PROCEDURE:  We used a 6-French Cordis guide catheter which  was 6-French 3.5 Judkins FL 3.5.  Guidewire was a Public relations account executive.  A  predilatation balloon consisted of a 2.0 x 10 mm Maverick balloon the stent  deployed was a 2.5 x 13 mm CYPHER stent.  The predilatation with balloon  angioplasty helped see the vessel better and I was able to drive the stent  over the area that needed to be covered in the mid diagonal.  I  then took  inflation pressures up to 13 atmospheres on 2 occasions for a minute and  after completing the 2 inflations there were some ST-segment elevations that  were transient, but no chest pain and no changes on blood pressure rhythm.  The lesion was reduced from 90% down to zero with preservation of TIMI 3  flow and no complications occurred.   FINAL DIAGNOSIS:  Percutaneous coronary stenting of the mid diagonal branch  #1 with reduction of 90% lesion to 0% residual.   PLAN:  The patient will remain on Plavix which he has been on long-term,  Angiomax was used during the case and was discontinued following completion  of the case, and no other anticoagulant agents are planned.      WHG/MEDQ  D:  11/17/2004  T:  11/17/2004  Job:  [id number]   cc:   Delfino Lovett A. Rollene Fare, M.D.  [phone] [address] [place] [address]  Fax: [facility]

## 2011-01-21 NOTE — Procedures (Signed)
[facility]  Patient:    [name], [name] Visit Number: [id number] MRN: [id number]          Service Type: MED Location: 3W 6767 02 Attending Physician:  Vincent Peyer Dictated by:   Lowella Bandy. Olevia Perches, M.D. [facility] Proc. Date: 01/16/02 Admit Date:  01/15/2002 Discharge Date: 01/16/2002   CC:         Loralee Pacas. [name], M.D.   Procedure Report  PROCEDURE:  Upper endoscopy.  ENDOSCOPIST:  Lowella Bandy. Olevia Perches, M.D.  INDICATIONS:  This 45 year old African American male was admitted through the emergency room with 24 hours of vomiting, low volume hematemesis, retching and left upper quadrant abdominal pain.  He has been evaluated for Crohns disease of the sigmoid colon demonstrated on colonoscopy in 2002.  A small bowel follow through at that time was normal.  He is a diabetic.  His blood sugars have been out of control.  A CT scan of the abdomen did not show any evidence of inflammatory process in the small or large intestine.  Because of the intractable vomiting and hematemesis, he is undergoing upper endoscopy.  ENDOSCOPE:  Olympus single channel videoscope.  SEDATION:  Versed 5 mg IV, Demerol 50 mg IV.  DESCRIPTION OF PROCEDURE:  The Olympus single channel videoscope was passed directly into the posterior pharynx and esophagus.  The patient was monitored by pulse oximetry.  His oxygen saturations was satisfactory.  The patient was initially quite anxious but was ultimately quite cooperative.  The proximal and mid esophagus mucosa was unremarkable.  The Z-line was at 40 cm from the incisors and appeared normal.  There was no esophagitis.  No evidence of Mallory-Weiss tear or erosion.  The stomach was insufflated with air and showed a small amount of _____ on the stomach. The gastric walls were normal.  There was no food retention.  The pyloric outlet was normal.  Retroflexion of the endoscope revealed normal fundus and cardia.  Duodenum:  The duodenum, duodenal bulb and descending duodenum were unremarkable.  CLOtest was taken from the gastric antrum.  IMPRESSION: Essentially normal upper endoscopy of the stomach, esophagus and duodenum.  PLAN: 1. Since the patient has been symptomatically improved, we will proceed to    advance his diet. 2. We will continue his acid suppressing agents. 3. His diabetes will be managed as an outpatient with appointment with    Dr. Renato Shin. 4. He will also follow up with Dr. Verl Blalock for management of his    Crohns disease. Dictated by:   Lowella Bandy. Olevia Perches, M.D. [facility] Attending Physician:  Vincent Peyer DD:  01/17/02 TD:  01/17/02 Job: [id number]

## 2011-01-21 NOTE — Discharge Summary (Signed)
[name], [name]            ACCOUNT NO.:  [id number]   MEDICAL RECORD NO.:  [id number]          PATIENT TYPE:  INP   LOCATION:  48                         FACILITY:  [facility]   PHYSICIAN:  Carey Bullocks. Gertie Exon, MD     DATE OF BIRTH:  1966/07/19   DATE OF ADMISSION:  08/20/2006  DATE OF DISCHARGE:  08/23/2006                               DISCHARGE SUMMARY   DISCHARGE DIAGNOSIS:  Deep venous thrombosis.   DISCHARGE MEDICATIONS:  1. Rivaroxaban, dosed by a study protocol.  2. Lipitor 80 mg daily.  3. Lisinopril 40 mg daily.  4. Actos 40 mg daily.  5. Lantus 60 units daily.  6. Toprol XL 25 mg daily.  7. Plavix 75 mg daily.  8. Aspirin 325 mg daily.   No known drug allergies   SUMMARY OF HOSPITALIZATION:  [name] is a 45 year old male with a  past medical history significant for diabetes mellitus, coronary artery  disease, hypertension, and a past CVT.  He has had a myocardial  infarction in the past as well as angina pectoris requiring stenting.  He also had a DVT in his left leg approximately three years ago but was  treated with six months of Coumadin.  He was admitted to the hospital on  August 20, 2006 with a chief complaint of pain and swelling in his  right leg.  His review of systems was negative for chest pain,  diaphoresis, cough, or dyspnea, and there were no other findings  suggestive of a pulmonary embolism.   His physical examination on admission was notable for an elevated blood  pressure of 144/92.  Normal respiratory function and O2 saturation.  Very tense, swollen, tender right leg from the foot up to the mid calf.  He had tenderness in both thighs.   A venous Doppler was performed while the patient was in the emergency  room, and this showed an extensive occlusive deep venous thrombosis  through the right and left lower extremities, including all deep veins  except for the left posterior tibial vein.  These included the bilateral  common iliac  veins.   The patient was admitted to the hospital, placed on low molecular weight  heparin, and began the Coumadin protocol.  He also was given Dilaudid  for pain control.  He was kept on a cardiac monitor, and no arrhythmias  were noted.  The patient had no pulmonary symptoms suggestive of a  pulmonary embolism.  Because of the extensive size of this DVT, I  consulted interventional radiology to suggest whether an IVC filter was  indicated.  They felt that because this patient was a candidate for  anticoagulation, he was not a candidate for filter placement.   While in the hospital, the patient consented to beginning a study that  compares the investigational anticoagulant, rivaroxaban, to standard  anticoagulation therapy.  The patient was randomized to the rivaroxaban  study arm and given informed consent and followup plans by the study  coordinator.  He remained clinically well with no adverse events noted.  Within two days of admission, the swelling in his right leg had  markedly  improved.   By hospital day #3, the patient was feeling well and ready for  discharge.  I had kept the patient on bedrest for other than bathroom  privileges, for three days, to let his clot stabilize while on  anticoagulant therapy.  I have further instructed the patient to limit  his activity for the next two weeks because the size of his clot.  After  that, he should be able to resume his normal daily activities.  I have  not restricted him from workup; however, I have suggested that he take  the rest of this week off through the end of the Christmas holiday.   PLAN:  He is discharged at this time with the following plan:  1. Continue rivaroxaban, which will be provided by the hospital      pharmacy and dosed by the study coordinator.  2. Follow-up visits, including toxicity monitoring, will be      coordinated by the study team.  3. The patient should return to Dr. Bubba Hales for clinical followup       within two weeks of discharge.  4. Lovenox and Coumadin have been discontinued.  If the patient has      any bleeding or thrombotic events, then the study coordinator      should be contacted.   Contact information for the study protocol is the following:  The  principal investigator is Dr. Asencion Noble.  His number is [phone].  The study coordinator who consented this patient is Jaqwon Manfred, [phone].      Carey Bullocks. Gertie Exon, MD  Electronically Signed     PML/MEDQ  D:  08/23/2006  T:  08/23/2006  Job:  [id number]   cc:   Darletta Moll. Bubba Hales, M.D.  Fax: [phone]   Burnett Harry Joya Gaskins, MD, FCCP  [address]  [place] [address]

## 2011-01-21 NOTE — Discharge Summary (Signed)
NAME:  [name], [name]                      ACCOUNT NO.:  [id number]   MEDICAL RECORD NO.:  [id number]                   PATIENT TYPE:  INP   LOCATION:  4728                                 FACILITY:  [facility]   PHYSICIAN:  Richard A. Rollene Fare, M.D.          DATE OF BIRTH:  1966/08/12   DATE OF ADMISSION:  04/04/2004  DATE OF DISCHARGE:  04/09/2004                                 DISCHARGE SUMMARY   DISCHARGE DIAGNOSES:  1. Coronary disease, anterior myocardial infarction this admission, treated     with left anterior descending and diagonal stenting April 04, 2004.  2. Preserved left ventricular function.  3. Insulin-dependent diabetes.  4. Treated hyperlipidemia.  5. Treated hypertension.  6. History of deep vein thrombosis in the lower extremity in 2004.   HOSPITAL COURSE:  The patient is a 45 year old male with a history of  diabetes, hypertension, and hyperlipidemia, but no prior cardiac history who  presented April 04, 2004 with chest pain consistent with unstable angina.  He  initially presented to the [facility] but his symptoms did not  improve.  He was given aspirin and sent to [facility] ER.  His EKG showed ST  elevation in V1-V6.  He was seen initially in the emergency room by Dr.  Rollene Fare at about noon on that 31st and taken to the catheterization lab.  This revealed 40% dominant RCA narrowing, 70% OM-2, total LAD and 95% first  diagonal.  The LAD and diagonal were dilated and stented.  His EF was 45 to  55% in the lab.  He had a Cypher placed in the LAD as well as the diagonal.  Reperfusion time was 9 hours 57 minutes.  The patient's pain actually  started about 3:00 a.m. that morning.  He was kept in the CCU post MI.  He  had no particular complications and no arrhythmias.  CKs peaked at 4,033  with 24.9 MBs.  He was put on a statin and continued on ACE inhibitor.  He  was enrolled at the time of his angioplasty in the TRITON study and is on  Plavix study drug.   He does have a history of DVT March 2004 in the left  lower extremity that was treated with Coumadin for a time.  Dr. Rollene Fare is  concerned that he may have a coagulopathy and several coagulation studies  were obtained prior to discharge and the results of these are pending.  He  had lupus anticoagulant, ANA, AT3 level, protein C, activated protein C,  protein S, factor V Leiden, prothrombin gene mutation level and  antiphospholipid antibody drawn.  We feel that he is stable for discharge  April 09, 2004.  He will follow-up with Dr. Rollene Fare August 12 at 1:45 p.m.  He has been instructed to contact Dr. Earney Mallet office to see him in about  three weeks.  He did receive 20 mEq of potassium at discharge; his K was 3.5  that  morning.   DISCHARGE MEDICATIONS:  1. Aspirin 81 mg a day.  2. Accupril 20 mg a day.  3. Zocor 40 mg a day.  4. Plavix study drug for the TRITON study.  5. Actos 45 mg a day.  6. Nitroglycerin sublingual p.r.n.  7. Lantus insulin as previously taken at home.  8. Nexium 40 mg a day.  9. Toprol XL 100 mg a day.   LABORATORIES:  Sodium 138, potassium 3.5, BUN 9, creatinine 0.8.  White  count 5.3, hemoglobin 12.4, hematocrit 36.8, platelets 246.  Chest x-ray  shows no active disease.  CKs peaked as noted above.  INR is 0.8.  Sed rate  20.  Lipid profile shows a cholesterol of 194, HDL 50, LDL 120,  triglycerides are 121.  TSH 0.79.  Urinalysis did show some glucose, but was  otherwise unremarkable.  ANA is negative.  EKG revealed sinus rhythm with Q  waves in III, aVF and V2-V4 with anterior T wave inversion.   DISPOSITION:  The patient is discharged in stable condition after anterior  myocardial infarction with late reperfusion secondary to late presentation.  He is enrolled in the TRITON study.  He will follow-up with Dr. Rollene Fare as  noted.      Erlene Quan, P.A.                      Richard A. Rollene Fare, M.D.    Meryl Dare  D:  04/09/2004  T:  04/10/2004   Job:  [id number]   cc:   Darletta Moll. Bubba Hales, M.D.  [address]  [place]  [place] [address]  Fax: [phone]

## 2011-01-21 NOTE — Discharge Summary (Signed)
   [name], [name]                        ACCOUNT NO.:  [id number]   MEDICAL RECORD NO.:  [id number]                   PATIENT TYPE:  INP   LOCATION:  3818                                 FACILITY:  [facility]   PHYSICIAN:  Ricard Dillon, M.D. [facility]           DATE OF BIRTH:  [birth year]   DATE OF ADMISSION:  04/02/2002  DATE OF DISCHARGE:  04/09/2002                                 DISCHARGE SUMMARY   ADMISSION DIAGNOSES:  1. Fever.  2. Intractable nausea and vomiting.  3. Chronic abdominal pain.   DISCHARGE DIAGNOSES:  1. Abdominal abscess.  2. Insulin requiring diabetes.  3. Abdominal pain.  4. Hypokalemia.  5. History of Crohn's disease.  6. History of diabetic neuropathy.  7. History of hypertension.   HOSPITAL COURSE:  The patient is a 45 year old white male who is admitted to  the internal medicine service after developing intractable nausea and  vomiting.  He has had a history of diabetic gastroparesis and a history of  bowel resection for Crohn's disease.   He is admitted to the general internal medicine service, and appropriate  evaluation was obtained by general surgery.   General surgery identified on the CT scan a loculated abscess which was then  drained with a CT guided drain.   The patient gradually defervesced on IV antibiotics, appropriate cultures  were obtained which were found to be Enterococcus, resistant to ampicillin,  intermediate to Augmentin, and sensitive to third generation cephalosporins  and fluoroquinolones.   On the day of discharge, the patient was considered stable for discharge.  Surgery recommended the drain stay in place for an additional three days,  and be removed in the office of Dr. Deon Pilling on Friday.  Radiology which  placed the drain, flushed the drain today for continued drainage.   DISCHARGE MEDICATIONS:  The patient will be discharged on all of his  preadmission medications, and added to that will be ciprofloxacin 750 mg  p.o. b.i.d. x10 days.    FOLLOWUP:  1. He should follow up with Dr. Deon Pilling on Friday, 04/12/02.  2. Dr. Loanne Drilling in approximately one to two weeks.                                               Ricard Dillon, M.D. [facility]    JEJ/MEDQ  D:  04/09/2002  T:  04/14/2002  Job:  [id number] [id number]   cc:   Bluford Main., M.D.  Fax: [phone]   Sean A. Loanne Drilling, M.D. [facility]

## 2011-01-21 NOTE — Discharge Summary (Signed)
NAMEJUNIE, ENGRAM                      ACCOUNT NO.:  [id number]   MEDICAL RECORD NO.:  [id number]                   PATIENT TYPE:  INP   LOCATION:  5005                                 FACILITY:  [facility]   PHYSICIAN:  Benito Mccreedy, M.D.             DATE OF BIRTH:  [birth year]   DATE OF ADMISSION:  11/26/2002  DATE OF DISCHARGE:  11/29/2002                                 DISCHARGE SUMMARY   PRIMARY CARE PHYSICIAN:  Darletta Moll. Heller, M.D.   DISCHARGE DIAGNOSES:  1. Left lower extremity deep venous thrombosis.  2. Diabetes mellitus.  3. Hypertension.  4. Crohn's, status post partial bowel resections x2.  5. Depression.  6. Anxiety.   DISCHARGE MEDICATIONS:  1. Lovenox 90 mg 1 subcutaneous injection twice daily.  2. Coumadin 2.5 mg tablet daily, at night for 2 days with subsequent dosing     per Dr. Bubba Hales on Monday, 3/29.  3. Toprol  XL 50 mg.  4. Accupril 40 mg per day.  5. __________ 10 mg before meals and at bedtime.  6. Lisinopril 40 mg 2 tablets daily.  7. Zelnorm 6 mg twice a day.  8. Nexium 40 mg daily.  9. Effexor XR 37.5 daily.  10.      Seroquel 25 mg every morning with 50 mg at night.  11.      Remeron 45 mg twice a day.  12.      Xanax 0.25 mg as needed.  13.      Ativan 1 mg as needed.  14.      Ambien 10 mg at bedtime as needed.  15.      Actos 45 mg daily.  16.      Lantis insulin 70 units at bedtime.   PROCEDURES:  1. Bilateral lower extremities ultrasound on 3/23 with acute DVT involving     the left mid-SFV and proximal calf vein to the mid-calf. Right leg     negative for DVT.  2. A 12-lead electrocardiogram performed 3/23 with normal sinus rhythm.   LABORATORY DATA:  WBC 5.2, hemoglobin 11.4, hematocrit 34.3, platelets 181,  PT 14.7, INR 1.1.  Sodium 138, potassium 3.9, chloride 102, CO2 31, glucose  234, BUN 10, creatinine 0.7, albumin 3.1, glycolated hemoglobin 7.6.   DISPOSITION:  The patient was discharged to home.   CONDITION ON  DISCHARGE:  Stable.   HISTORY OF PRESENT ILLNESS:  A 45 year old male with a history of diabetes  mellitus and hypertension who presented to [name] on 3/23  after having seen his primary care physician, Dr. Bubba Hales for left leg pain.  The patient reported that he had started experiencing some left leg pain 3  days prior and then a few days prior that had worsened. He reported that the  pain went up and down his leg. He was sent for a Doppler at [facility] by his  primary care physician which revealed  acute DVT in the left lower extremity  and he was admitted for further treatment.   HOSPITAL COURSE:  DIAGNOSES #1 - LEFT LOWER EXTREMITY DEEP VENOUS  THROMBOSIS.  He was admitted to a medical bed, Lovenox was started along  with Coumadin therapy, with daily PT/INR's.  At discharge, his PT  was 14.7  and INR was still therapeutic at 1.1. The patient was discharged to home  with Lovenox and Coumadin after extensive education.  The patient states  that he is comfortable giving himself Lovenox subcutaneously. He has an  appointment in Dr. Earney Mallet office in Monday morning for PT/INR and further  adjustment of his Coumadin if needed.   DIAGNOSES #2 - DIABETES MELLITUS. Stable, continue outpatient medications.   DIAGNOSES #3 - HYPERTENSION.  Controlled, continue outpatient medications.   DISCHARGE INSTRUCTIONS/FOLLOW UP:  Again, the patient was instructed on  Lovenox injections. Also, he was instructed regarding Coumadin and to report  any signs or symptoms of bleeding.      Stephanie Martinique, NP                      Benito Mccreedy, M.D.    SJ/MEDQ  D:  11/29/2002  T:  11/30/2002  Job:  [id number]   cc:   Darletta Moll. Bubba Hales, M.D.  [address]  [place]  [place] [address]  Fax: [phone]

## 2011-01-21 NOTE — H&P (Signed)
NAMECLOYD, [name]                        ACCOUNT NO.:  [id number]   MEDICAL RECORD NO.:  [id number]                   PATIENT TYPE:  INP   LOCATION:  B048                                 FACILITY:  [facility]   PHYSICIAN:  Bluford Main., M.D.         DATE OF BIRTH:  07/10/1966   DATE OF ADMISSION:  06/07/2002  DATE OF DISCHARGE:                                HISTORY & PHYSICAL   CHIEF COMPLAINT:  Has an ileostomy.   HISTORY OF PRESENT ILLNESS:  The patient is a 45 year old black male who is  about four months status post diverting ileostomy and right colectomy for a  ruptured appendix with abscess and peritonitis and severe illness at that  time.  His postoperative course was complicated by right-sided abdominal  abscess and by some chronic pain around the ileostomy, but he never required  any operative re-exploration.  He has a history of Crohn's disease and it is  not certain whether that is active or not at this time.  No active Crohn's  disease was seen in the colon and ileum that were removed.  The patient has  seen me in the office and requested closure of the ileostomy, and I feel  enough time is passed and that he is stable enough for this to be done.  He  accepts the risks of a repeat laparotomy and a bowel anastomosis.  He took  his prep as an outpatient and tolerated it well except for the antibiotic  tablets and he could not tolerate all of those but did take two of the  doses.  He is admitted for an ileostomy closure.   PAST MEDICAL HISTORY:  The patient is a diabetic.  I am not sure whether  type 1 or type 2 but is insulin-dependent.  He has had gastroparesis due to  his diabetes.  He also has hyperlipidemia and he has hypertension.  He has  taken narcotics for a long time and has become dependent on narcotic  medication.  He says he follows a diabetic diet.  He says his sugars have  been generally well controlled lately.  In addition to the colectomy for  his  appendicitis, he has had an inguinal hernia repair.  He has had several  admissions since his colectomy and ileostomy for vomiting, nausea, and pain.  This has been mostly attributed to his gastroparesis.  He is felt by Dr.  Philip Aspen and by Dr. Loanne Drilling, his medical doctor, that he is fit for surgery  at this time.  Please see the extensive [facility] records for more  details.   CURRENT MEDICATIONS:  1. Zelnorm.  2. __________.  3. Remeron.  4. Fentanyl patch.  5. Zofran.  6. Oral intermittent narcotic pain medicine such as Vicodin.  7. Lantus insulin 30 units at bedtime.  8. Reglan 10 mg three times a day.  9. Pentasa for control of his Crohn's disease.  10.  Nexium.  11.      Actos 45 mg daily.  12.      Toprol-XL 50 mg daily.  13.      Hydrochlorothiazide 50 mg daily.   ALLERGIES:  He denies medication allergies.   PHYSICAL EXAMINATION:  VITAL SIGNS:  Temperature and vital signs as reported  by nursing staff.  GENERAL:  The patient is a thin, somewhat frail-appearing black male, no  acute distress, and does not appear acutely ill.  Mental status is normal.  HEAD, NECK, EYES, EARS, NOSE, MOUTH, AND THROAT:  Unremarkable.  CHEST:  Clear to auscultation.  HEART:  Rate and rhythm normal.  No murmur or gallop.  ABDOMEN:  A well-healed midline incision.  Functional normal-appearing  ileostomy in the right side of the abdomen.  No mass, tenderness, or  organomegaly is noted.  GENITALIA:  Normal.  RECTAL:  Not performed at this occasion.  EXTREMITIES:  Normal.  LYMPH NODES:  None enlarged.  SKIN:  No lesions noted.  NEUROLOGICAL:  No gross abnormalities detected.   IMPRESSION:  1. Status post right colectomy ileostomy for a ruptured appendix.  2. Insulin-dependent diabetes mellitus.  3. Possible Crohn's disease by history.  4. Diabetic gastroparesis.  5. Hypertension.  6. Narcotic dependency.   PLAN:  Closure of ileostomy.                                                 Bluford Main., M.D.    Clydene Pugh  D:  06/07/2002  T:  06/07/2002  Job:  [id number]

## 2011-01-21 NOTE — Op Note (Signed)
[name], SPRUCE                        ACCOUNT NO.:  [id number]   MEDICAL RECORD NO.:  [id number]                   PATIENT TYPE:  INP   LOCATION:  1884                                 FACILITY:  [facility]   PHYSICIAN:  Bluford Main., M.D.         DATE OF BIRTH:  15-May-1966   DATE OF PROCEDURE:  06/07/2002  DATE OF DISCHARGE:                                 OPERATIVE REPORT   PREOPERATIVE DIAGNOSES:  Ileostomy status post diversion for ruptured  appendix.   POSTOPERATIVE DIAGNOSES:  Ileostomy status post diversion for ruptured  appendix.   OPERATION:  Closure of ileostomy.   SURGEON:  Ward Givens, M.D.   ASSISTANT:  Abbott Pao. March Rummage, M.D.   ANESTHESIA:  General.   DESCRIPTION OF PROCEDURE:  After the patient was monitored and anesthetized  and had routine preparation and draping of the abdomen, I sewed the  ileostomy closed with a pursestring 2-0 silk stitch. I excised the midline  skin scar and dissected down through the scar and fat to the midline fascia  and carefully incised it with a scalpel. There were some adhesions of the  bowel to the anterior abdominal wall and so I carefully opened the fascia  and took down the adhesions with Bovie and with the scissors. Most of the  abdominal cavity was relatively free of adhesions. I separated the small  bowel from the distal colon and could easily see the staple line where I had  divided the distal colon. After freeing that up, I had good mobility. I  incised the skin around the ileostomy and dissected down through the fat  until I came down to the fascia and incised that and then took down the  scars on the inside and pulled the ileostomy back into the abdomen. I  mobilized the small bowel from the right upper quadrant so it could be  pulled down and anastomosis done keeping the small bowel inferior to and to  the left of the anastomotic area. I carefully inspected the right gutter and  right side abdominal  wall and found no evidence of any chronic abscess or  any other abnormality that might explain the patient's pain he had been  having in the right side of the abdomen. I dissected through the mesentery a  few centimeters proximal to the ileostomy where the bowel was well  vascularized and free of adhesions and scar and divided the mesentery  between Kelly clamps and ligated the vessels with 2-0 silks. I stapled  across the bowel with cutting stapler and handed off the ileostomy. I  approximated the ends of the distal colon and the proximal ileum, held them  in place with sutures and performed a side to side anastomosis with the  cutting stapler. I then checked the inside of the bowel and saw that no  bleeding was occurring. I closed the remaining defect with interrupted 3-0  silk stitches. I closed the  mesenteric defect with interrupted 2-0 silk  stitches. Vascularity appeared good and the competence of the anastomosis  was good. I then briefly irrigated the right upper quadrant and removed the  irrigant. No bleeding was occurring. Sponge, needle and instrument counts  were correct. I closed the fascia with running #1 PDS and then irrigated the  subcutaneous tissues and closed the skin with staples.  Prior to abdominal closure, I had closed the ileostomy site with a layer of  #1 PDS placed on the inside through the posterior fascia and in the wound on  the anterior fascia. I also closed the ileostomy site with staples. The  patient tolerated the operation well.                                               Bluford Main., M.D.    Clydene Pugh  D:  06/07/2002  T:  06/07/2002  Job:  [id number]   cc:   Loralee Pacas. [name], M.D. [facility]   Sean A. Loanne Drilling, M.D. [facility]

## 2011-01-21 NOTE — Op Note (Signed)
[facility]  Patient:    [name], [name] Visit Number: [id number] MRN: [id number]          Service Type: SUR Location: 4W [place] 01 Attending Physician:  Tiffany Kocher Dictated by:   Ward Givens, M.D. Proc. Date: 02/12/02 Admit Date:  02/12/2002   CC:         Loralee Pacas. [name], M.D. [facility]  Ricard Dillon, M.D. [facility]   Operative Report  PREOPERATIVE DIAGNOSIS:  Perforated viscus.  POSTOPERATIVE DIAGNOSES:  1. Perforated right colon probably due to gangrenous appendicitis with     peritonitis and abscess.  2. Crohns disease of the small bowel.  OPERATION PERFORMED:  Right hemicolectomy with resection of distal ileum, ileostomy and stapling of the distal segment.  SURGEON:  Ward Givens, M.D.  ASSISTANT:  Coralie Keens, M.D.  ANESTHESIA:  General.  DESCRIPTION OF PROCEDURE:  After the patient was monitored and anesthetized and had had a central line placed by Dr. Winfred Leeds and after routine preparation and draping of the abdomen, I made a midline incision from above the symphysis pubis to an area above the umbilicus. I entered the abdomen and noted a great deal of somewhat foul smelling pus and fluid. I sucked out the fluid and then examined the abdominal contents. The stomach and duodenum appeared normal. The small bowel appeared normal except for some fat creeping out on it toward the antimesenteric side indicating some possible Crohns disease. The distal colon appeared normal. There was some stool in the distal colon. There was a great deal of inflammation around the hepatic flexure of the colon. I could free up the cecum and see it but could not see an appendix. I then mobilized the right colon by incising the peritoneum lateral to it and reflecting it medially and reflecting the distal ileum medially. I then took down the hepatic flexure with the Bovie and with clamps and ties brought the hepatic flexure anteriorly and  then worked on across some of the omentum to the right transverse colon. I then divided the colon proximally and distally with a cutting stapler dividing the distal ileum several centimeters proximal to the inflammatory process. I then resected the colon by clamping them with Kelly clamps and ligating them with 2-0 silk. I then examined the area of the cecum and could not find the appendix or a definite hole but a great deal of inflammation was present and there was a structure present which might have been gangrenous appendix. I then copiously irrigated the abdominal cavity in all four quadrants and removed the irrigant. I made a hole in the right upper quadrant near the lateral edge of the rectus muscle and after clearing away some of the fat and freeing up some of the mesentery of the distal ileum I brought it through into the subcutaneous space and out to the skin. I then closed the abdomen after correct sponge, needle and instrument counts. I used #1 PDS for the fascia starting the running suture from both ends and tying in the middle. I loosely closed the skin packing the subcutaneous tissues with moist gauze. I then cut off a couple centimeters of the distal ileum and found that the mucosa was pink and that bleeding occurred. I fashioned Brooke type ileostomy using 4-0 Vicryl suture and it made a nice nipple and appeared healthy. I applied a colostomy pouch. The patient was generally stable through the procedure. Dictated by:   Ward Givens, M.D. Attending Physician:  Deon Pilling,  Theresa Duty DD:  02/12/02 TD:  02/14/02 Job: [id number]

## 2011-01-21 NOTE — H&P (Signed)
NAME:  [name], [name]                      ACCOUNT NO.:  [id number]   MEDICAL RECORD NO.:  [id number]                   PATIENT TYPE:  INP   LOCATION:  2836                                 FACILITY:  [facility]   PHYSICIAN:  Malcolm T. Fuller Plan, M.D. [facility]          DATE OF BIRTH:  1966/06/02   DATE OF ADMISSION:  07/26/2002  DATE OF DISCHARGE:                                HISTORY & PHYSICAL   CHIEF COMPLAINT:  Recurrent nausea, vomiting, intractable with abdominal  pain.   HISTORY:  The patient is a 45 year old African-American male with multiple  admissions over the past year who carries the diagnosis of diabetic  gastroparesis also felt to be in part medication-induced which has been  refractory.  The patient is also felt to have significant anxiety and  depression with functional overlay to his symptoms.  He has hypertension,  chronic pain syndrome, and was recently weaned off narcotics last admission  November 7th to July 20, 2002.  He had a perforated appendix in June of  2003 requiring an ileostomy, and then had ileostomy reversal by Dr. Deon Pilling  in October of 2003.  He is also status post right inguinal repair and has a  history of GERD.  There is some suspicion of Crohn disease per a prior  colonoscopy but no definite evidence and no evidence at the time of his  recent surgery.  He does have positive IBD markers.   The patient was discharged last on July 22, 2002 with high-dose Reglan  20 mg q.i.d. also on Zelnorm 6 mg b.i.d. and off of narcotics.  With that he  had improved gastric emptying scan showing only 47% retention.  The patient  had also been seen by Dr. Rhona Raider last admission and had several changes  made in his psychiatric meds.   The patient now presents with recurrent nausea and vomiting over the past 48  hours.  He has been unable to keep down any p.o.'s and complains of feeling  weak and having increased abdominal pain.  He has not had any fever but  has  had some chills.  No diarrhea, melena or hematochezia.  No other new  symptoms related.  He says the pain is on the right side of his abdomen  which is the usual area.  He is readmitted for supportive care and we will  consider a referral to [facility] with Dr. Jacqlyn Larsen for refractory gastroparesis.   CURRENT MEDICATIONS:  1. Metoprolol 50 mg q.d.  2. Zelnorm 6 mg b.i.d.  3. Protonix 40 mg q.d.  4. Seroquel 50 mg q.h.s.  5. Lisinopril 80 mg q.d.  6. Remeron 30 mg q.h.s.  7. Effexor 37.5 mg q.d.  8. Pindolol 2.5 mg t.i.d.  9. Reglan 20 mg q.i.d.  10.      Ativan 1 mg t.i.d. p.r.n.  11.      Lantus insulin 38 units q.h.s.  12.  Actos 45 units q.d.   ALLERGIES:  AUGMENTIN which causes nausea and vomiting.   PAST MEDICAL HISTORY:  As outlined above.  Please see the recent H&P.   SOCIAL HISTORY:  The patient is married, had two children ages 86 and 79.  He  is currently on disability.  No tobacco and no EtOH.   FAMILY HISTORY:  Pertinent for coronary artery disease and colon polyps.   REVIEW OF SYSTEMS:  Pertinent for ongoing problems with anxiety and also  complaining currently of insomnia despite all of the above medications.  He  also complains of persistent weakness and ambulates with a cane.   PHYSICAL EXAMINATION:  GENERAL:  A well-developed African-American male,  thin and in no acute distress but ill-appearing and uncomfortable.  VITAL SIGNS:  Temp is 100.1, pulse 102, respirations 16, blood pressure not  recorded as yet.  HEENT:  Normocephalic, atraumatic.  EOMI, PERRLA.  Sclerae anicteric. He is  thin.  NECK:  There is no JVD or bruits.  CARDIOVASCULAR:  Slightly tachy regular rhythm with S1 & S2.  No murmur,  rub, or gallop.  PULMONARY:  Clear to A&P.  ABDOMEN:  Soft, bowel sounds are active.  There is no focal tenderness, no  guarding, no mass, or hepatosplenomegaly.  Does have multiple incisional  scars.  RECTAL:  Not done at this time.  EXTREMITIES:  Without  clubbing, cyanosis, or edema.  NEUROLOGICAL:  Grossly nonfocal.  Alert and oriented.  Gait is not tested.   LABORATORY DATA:  Laboratory studies pending on admission.   IMPRESSION:  11. A 45 year old male with recurrent intractable nausea and vomiting with     abdominal pain and diagnosis of gastroparesis, question current episode     secondary to refractory gastroparesis versus underlying infection     possibly viral presenting with a low-grade temp, rule out functional     component.  2. Insulin-dependent diabetes mellitus.  3. Weight loss secondary to above, 80 pounds over the past year.  4. Questionable diagnosis of Crohn disease with positive markers and     negative pathology thus far.  5. Hypertension.  6. Anxiety and depression.  7. Status post appendectomy, ileostomy, and reversal of ileostomy October     2003.  8. Status post right inguinal hernia repair.  9. Gastroesophageal reflux disease.   PLAN:  1. The patient is admitted to the service of Norberto Sorenson T. Fuller Plan, M.D. for IV     fluid hydration.  Will have PICC line placed as he has no peripheral     access.  Start him on IV antiemetics, IV Reglan and p.o. Zelnorm.  Will     plan to leave him off of narcotics.  Will check UA, chest x-ray, and a     KUB and plan cultures if his temp spikes to 101.  Will discuss a referral     to [facility] and possible transfer to [facility] depending on his course this     admission to be     evaluated by Dr. Jacqlyn Larsen per gastroparesis.  2. Will ask [facility] to follow him along from a medical     standpoint per hypertension, diabetes, etc., as he is a primary patient     of Dr. Bubba Hales.     Amy Esterwood, P.A.-C. [facility] T. Fuller Plan, M.D. [facility]    AE/MEDQ  D:  07/26/2002  T:  07/26/2002  Job:  [id number]   cc:   Darletta Moll. Bubba Hales, M.D.  [address]  [place]  [place] [address]  Fax: [phone]

## 2011-01-21 NOTE — H&P (Signed)
[name], [name]                      ACCOUNT NO.:  [id number]   MEDICAL RECORD NO.:  [id number]                   PATIENT TYPE:  INP   LOCATION:  2440                                 FACILITY:  [facility]   PHYSICIAN:  Gatha Mayer, M.D. [facility]           DATE OF BIRTH:  23-Aug-1966   DATE OF ADMISSION:  07/12/2002  DATE OF DISCHARGE:                                HISTORY & PHYSICAL   CHIEF COMPLAINT:  Nausea and vomiting and abdominal pain.   HISTORY OF PRESENT ILLNESS:  This is a 45 year old African-American man with  diabetes mellitus on insulin who is having a flare of his known  gastroparesis with several days of worsening nausea and vomiting which is  now intractable.  He is on Zofran and Reglan at home, and he has vomited at  least eight times.  He has a potassium level of 2.4.  He is having recurrent  epigastric pain which is typical for this problem.  He was discharged from  the hospital on June 27, 2002, after revision of his ileostomy and  subsequent prolonged hospital course due to nausea and vomiting associated  with that.  He denies any fevers or bowel habit changes.  He does move his  bowels with some constipation, but generally he moves them at least every  other day or so.  He denies any headaches or visual changes.  He does feel  weak, but he has not been orthostatic or having any presyncopal symptoms or  any other infectious type of symptoms with fevers or chills or muscle aches,  etc.  All other systems are negative at this time in review of systems.   PAST MEDICAL HISTORY:  1. As above.  2. Diabetes, suspect it is type 2.  3. Hypotension.  4. Depression and anxiety.  5. Questionable history of Crohn's disease.  The patient has had rectal     ulcerations and polypoid changes with biopsies consistent with mucosal     prolapse syndrome, but he had serologic markers positive for Crohn's     disease.  As best I can tell, there is no other objective  evidence for     Crohn's disease.  He is not currently on any active therapy for that.  6. Status post perforated appendix with abscess and resection and ileostomy,     and reversal of his ileostomy was recently performed by Dr. Deon Pilling in     October.  7. Dyslipidemia.  8. Status post right inguinal hernia repair.  9. History of narcolepsy.  10.      History of gastroesophageal reflux disease.   MEDICATIONS:  1. Fentanyl patch 25 mg every 72 hours.  2. Reglan 10 mg q.a.c.  3. Zofran 1-2 mg q.6h. p.r.n. nausea.  4. Remeron 45 mg q.h.s.  5. Toprol XL 50 mg q.d.  6. Nexium 40 mg q.d.  7. Diovan 160 mg q.d.  8. Vicodin 5/500 1-2 p.o.  q.6h. p.r.n. pain.  9. Lantus insulin 38 units q.h.s.  10.      Actos 45 mg q.d.   DRUG ALLERGIES:  AUGMENTIN.  He is sensitive to that, causing nausea and  vomiting.   SOCIAL HISTORY:  No tobacco.  Here with his wife.   FAMILY HISTORY:  Notable for colon polyps, colitis, myocardial infarction,  and diabetes.   PHYSICAL EXAMINATION:  GENERAL:  Mildly chronically ill black man.  Otherwise, well-developed, well-nourished, in no acute distress.  VITAL SIGNS:  Blood pressure 177/110, temperature 99.7, pulse 83,  respirations 18-20.  HEENT:  Eyes are anicteric.  Mouth:  Mucous membranes are moist, free of  lesions.  NECK:  Supple.  No masses or thyromegaly.  CHEST:  Clear.  HEART:  S1, S2.  No rubs, murmurs, or gallops.  ABDOMEN:  Soft.  He has some tenderness in the right upper quadrant and the  epigastric area, without masses.  This is worse with muscle tension.  Bowel  sounds are diminished.  I do not hear a succession splash.  Note, there is  no obvious abdominal wall hernia seen on palpation.  LYMPH NODES:  No neck supraclavicular or groin adenopathy.  EXTREMITIES:  Free of edema.  Warm and dry.  NEUROLOGIC:  Alert and oriented x 3.   LABORATORY DATA:  Hemoglobin 11.0, hematocrit 32.5, MCV 80, RDW 17, white  count 4.7.  Sodium 137, potassium  2.4, chloride 98, CO2 29, glucose 188, BUN  3, creatinine 0.6, bilirubin 0.9, alkaline phosphatase 61, SGOT 18, SGPT 11,  total protein 6.9, albumin 3.4, calcium 8.6.  His lipase is normal.  His  urinalysis is notable for trace ketones, protein 30, urobilinogen negative,  otherwise negative.   Abdominal x-rays are pending at this time.   ASSESSMENT:  1. Recurrent gastroparesis with flare.  2. Diabetes mellitus.  3. Chronic pain syndrome, on chronic narcotics at this point.  4. Depression.  5. Status post recent revision of ileostomy, which I do not think is related     to this current problem; I think this is gastroparesis.   PLAN:  1. Admit.  2. Hydrate.  3. Replete potassium; he is hypokalemic.  4. Check magnesium level.  5. Laboratory draw, as he could have concomitant magnesium deficiency as     well exacerbating his hypokalemia.  6. IV Reglan on a running basis.  7. Short-term insulin control with sliding scale and running Regular     Insulin.  8. May need internal medicine consultation to help with management of     diabetes.  Will determine that.  9.     Long-term management issues could include consideration of jejunostomy tube      and decompressive gastrostomy or consideration of evaluation by Dr. Derrill Kay,     a gastroparesis/mobility expert who moved to [facility] this year.                                               Gatha Mayer, M.D. [facility]    CEG/MEDQ  D:  07/13/2002  T:  07/13/2002  Job:  [id number]   cc:   Hilliard Clark A. Loanne Drilling, M.D. [facility]   Loralee Pacas. [name], M.D. [facility]

## 2011-01-21 NOTE — Consult Note (Signed)
[name], [name]                        ACCOUNT NO.:  [id number]   MEDICAL RECORD NO.:  [id number]                   PATIENT TYPE:   LOCATION:                                       FACILITY:   PHYSICIAN:  Earnstine Regal, M.D.                DATE OF BIRTH:   DATE OF CONSULTATION:  04/26/2002  DATE OF DISCHARGE:                           GENERAL SURGERY CONSULTATION   REASON FOR CONSULTATION:  Abdominal pain.   HISTORY OF PRESENT ILLNESS:  The patient is a 45 year old black male well  known to my surgical practice. The patient has been cared for by my partner,  Dr. Lindwood Qua. The patient has had a complicated Freda Munro, MD.  The  patient has had a complicated surgical course dating to June of 2003.  Apparently, the patient presented with a one month history of intermittent  abdominal pain, Crohn's disease, and acute onset of severe abdominal pain.  On February 12, 2002 he underwent exploration and was found to have abscess at  the site of the appendix. He was treated with right colectomy and terminal  ileostomy. He continued to have a course complicated by fever, abdominal  pain, and nausea. He was found to have a right lower quadrant abscess and  underwent percutaneous drainage. He was treated with IV antibiotics. The  patient subsequently was followed up at the office. Dr. Deon Pilling removed his  drainage catheter on April 12, 2002. However, the patient was re-admitted on  April 20, 2002 at [facility] for abdominal pain and  nausea. He was discharged home on April 25, 2002. The patient presented  this morning to [facility] with excruciating abdominal pain around  the site of his ileostomy. He was admitted to [facility]  by Dr. Cathlean Cower and general surgical consult was requested.   PAST MEDICAL HISTORY:  1. History of Crohn's disease.  2. History of hypertension.  3. History of diabetes mellitus with gastroparesis.  4.  History of hypercholesterolemia.  5. History of attention deficit disorder.  6. Status post right inguinal herniorrhaphy.   MEDICATIONS:  1. Accupril.  2. Lantus insulin.  3. Reglan.  4. Ambien.   ALLERGIES:  AUGMENTIN (causing GI upset).   SOCIAL HISTORY:  The patient denies tobacco use.   FAMILY HISTORY:  Notable for colonic polyps, colitis, myocardial infarction  and diabetes.   PHYSICAL EXAMINATION:  GENERAL: A 45 year old well developed, well nourished  black male on [facility] at [facility] in no acute  distress.  VITAL SIGNS: Pending.  HEENT: Normocephalic. Sclera clear. Dentition fair. Voice normal.  LUNGS: Clear to auscultation and percussion.  CARDIAC: Irregular rhythm with slight tachycardia with rate of 105.  ABDOMEN: Soft, scaphoid. There are few bowel sounds present on auscultation.  Dressing is removed from the midline wound and shows a small area of  granulation tissue, measuring 1x2 cm in size. There  is no drainage. There is  a pink ileostomy present with an appliance in place, containing  succenturiatus. There is minimal tenderness of the abdomen. There are no  palpable masses. There are a few bowel sounds present on auscultation.  EXTREMITIES: Nontender without edema.  NEURO: The patient shows no focal neurologic deficit.   LABORATORY DATA:  CBC, complete metabolic profile, amylase level all pending  at the time of dictation.   RADIOGRAPHIC STUDIES:  CT scan from April 22, 2002 reviewed. This shows no  evidence of recurrent abscess. There is no evidence of fistula. There are  mild inflammatory changes in the pelvis, which appear to be improved from  prior studies. There has been interval removal of the drainage catheter from  the right lower quadrant.   IMPRESSION:  A 45 year old black male with complex surgical and medical  history, recent complications following perforated appendicitis with abscess  and known history of Crohn's  disease.   PLAN:  1. Agree with admission per Dr. Cathlean Cower.  2. IV hydration.  3. Await results of admission lab studies.  4. Would hold on CT of the abdomen and pelvis at present.  5. Would avoid IV antibiotics at present, pending lab data.  6. Will follow closely.  7. Dr. Deon Pilling will resume this patient's care on Monday, April 29, 2002.                                               Earnstine Regal, M.D.    TMG/MEDQ  D:  04/26/2002  T:  04/26/2002  Job:  [id number]   cc:   Biagio Borg, M.D. [facility]   Bluford Main., M.D.  Fax: [phone]

## 2011-01-21 NOTE — H&P (Signed)
[place]  Patient:    [name], [name] Visit Number: [id number] MRN: [id number]          Service Type: MED Location: [phone] Attending Physician:  Gloris Ham Dictated by:   Gloris Ham, M.D. [facility] Admit Date:  03/14/2002                           History and Physical  DATE OF BIRTH:  September 29, 1963  REASON FOR ADMISSION:  Intractable vomiting.  HISTORY OF PRESENT ILLNESS:  The patient is a 45 year old man who was recently hospitalized for perforated right colon due to gangrenous appendicitis with peritonitis and associated abscess.  The patient underwent a right hemicolectomy by Dr. Deon Pilling on February 12, 2002.  The patient clinically improved and went home two days ago.  The patient states that after he got home he started having severe nausea and vomiting.  There was associated slight pain generalized across the abdomen.  The patient has lost 38 pounds in the past two months.  PAST MEDICAL HISTORY: 1. Type 2 diabetes. 2. ADHD. 3. Dyslipidemia. 4. Hypertension. 5. Obesity. 6. Diabetic nephropathy. 7. Noncardiogenic chest pain. 8. Crohns disease. 9. Diabetic gastroparesis.  MEDICATIONS:  At the time of discharge, Lantus 80 units daily.  SOCIAL HISTORY:  The patient does not smoke nor drink alcohol.  He is married. He works as a Scientist, clinical (histocompatibility and immunogenetics).  REVIEW OF SYSTEMS:  The patient has slight depression.  He feels this is due to his recent illness.  He denies the following:  Fever, syncope, chest pain, shortness of breath, heartburn, rectal bleeding, hematuria, diarrhea, and skin rash.  PHYSICAL EXAMINATION:  VITAL SIGNS:  Blood pressure 118/80, heart rate 90, temperature 98.8.  Weight 164.  GENERAL:  The patient appears chronically ill and has obviously lost weight recently as redundant skin is noted.  SKIN:  Redundant but not diaphoretic.  HEENT:  Head is atraumatic.  Sclerae nonicteric.  Pharynx  clear.  NECK:  Supple.  CHEST:  Clear to auscultation.  CARDIOVASCULAR:  No JVD.  No edema.  Regular rate and rhythm.  No murmur. Pedal pulses are intact.  ABDOMEN:  There is a pack of wet-to-dry dressings in place in the anterior abdomen.  There is also an ileostomy on the right side.  The abdomen is soft. It is nontender.  There is no hepatosplenomegaly.  EXTREMITIES:  No obvious deformity of the joints.  There is no foot ulcer.  NEUROLOGIC:  Alert, well oriented.  Cranial nerves are intact bilaterally, and sensation is intact to touch on the feet.  IMPRESSION:  Intractable vomiting.  PLAN: 1. Readmit to [facility] today, as [facility] is full. 2. Symptomatic therapy with Phenergan. 3. Intravenous fluid. 4. Consult surgery. Dictated by:   Gloris Ham, M.D. [facility] Attending Physician:  Gloris Ham DD:  03/14/02 TD:  03/17/02 Job: [id number]

## 2011-01-21 NOTE — Discharge Summary (Signed)
NAMEJVION, TURGEON                        ACCOUNT NO.:  [id number]   MEDICAL RECORD NO.:  [id number]                   PATIENT TYPE:  INP   LOCATION:  2876                                 FACILITY:  [facility]   PHYSICIAN:  Gwendolyn Grant, M.D. [facility]          DATE OF BIRTH:  10-Mar-1966   DATE OF ADMISSION:  06/07/2002  DATE OF DISCHARGE:  06/24/2002                                 DISCHARGE SUMMARY   DISCHARGE DIAGNOSES:  1. Ileostomy reversal, resolved.  2. Chronic nausea and vomiting secondary to gastroparesis, stable.  3. Type 2 diabetes, stable.  4. Hypertension.  5. Depression/anxiety disorder.  6. History of Crohn's disease.   DISCHARGE MEDICATIONS:  1. Fentanyl 50 mcg patch q.72h.  2. Reglan 10 mg p.o. q.a.c.  3. Zofran 2 mg 1-2 tablets p.o. q.6h. p.r.n. nausea.  4. Remeron 15 mg 3 tablets p.o. q.d.  5. Toprol XL 50 mg 1 tablet p.o. q.d.  6. Nexium 40 mg p.o. q.d.  7. Diovan 160 mg p.o. q.d.  8. Vicodin 5/500 1-2 tablets p.o. q.6h. p.r.n. pain.  9. The patient will hold his Lantus 30 units q.h.s., his Actos 45 mg q.d.,     and his Pentasa 250 mg 3 tablets p.o. t.i.d. until further evaluation by     his primary care physician.   FOLLOW-UP:  Hospital follow-up will be his primary care physician, Dr.  Loanne Drilling, on Friday, June 28, 2002, at 3:45 p.m.  At that visit his CBGs  will be reviewed and his diabetic medications resumed if blood sugars will  allow it.  He also will contact Dr. Deon Pilling, his general surgeon, for follow-  up in approximately one month to insure complete resolution of his ileostomy  reversal.   DISPOSITION:  The patient is discharged to home.   CONDITION ON DISCHARGE:  Improved and stable.   HISTORY OF PRESENT ILLNESS:  The patient is a 45 year old gentleman with a  complicated history of perforated appendix with abscess formation in July  2003.  At that time he underwent surgery for drainage including placement of  an ileostomy.  He is  readmitted on the day of admission for reversal of this  ileostomy.   HOSPITAL COURSE:  1. Ileostomy reversal, resolved:  As stated above, the patient underwent     general anesthesia to reverse his ileostomy by Dr. Georgina Quint.  The     surgery was uncomplicated.  Postoperatively was with slow recovery as     detailed below.  At the time of discharge the patient's wound appears     well-healed and involving no further surgical intervention.  Of note,     pathology from the ileum revealed no evidence of Crohn's disease, active     or otherwise.  Thus, recommendations are made by the general surgeon,     given the absence of Crohn's symptoms, that the patient hold on his     Pentasa with questionable  diagnosis of his Crohn's disease.  Follow-up     will be arranged with GI as needed.   1. Chronic nausea and vomiting, multifactorial:  The patient has a long     history of severe gastroparesis secondary to his diabetes.  Thus, most of     his postoperative time in the hospital was spent with slow advancements     and occasional setbacks on his diet.  The patient's potassium was     maintained in the normal range and, at the time of discharge, the patient     is tolerating a regular diet without nausea and vomiting.  He will     continue his fentanyl patch for control of his chronic abdominal pain and     treat his nausea and gastroparesis symptoms with Reglan and Zofran before     his meals.  The patient is reminded to minimize the amount of narcotic     use as these aggravate his gastroparesis.  Because of the patient's     frequent vomiting, it is recommended that potassium levels be rechecked     until this issue has completely resolved.   1. Type 2 diabetes:  The patient's CBGs in the hospital were occasionally     hypoglycemic and, therefore, the patient was maintained only on sliding     scale insulin.  Even at this, the patient seldom needed sliding scale     coverage as his  CBGs rarely ranged above 160.  Accordingly, the patient     is not yet consuming his regular home diet.  His Lantus and Actos have     been held at the time of discharge.  The patient is requested to keep a     log of the CBGs and contact his physician if sugars range higher than     200.  Hospital follow-up is scheduled short-term as it is assumed that     the patient will need to resume his Actos and/or Lantus in the near     future.   1. Hypertension:  The patient's blood pressure remained high normal during     this hospitalization.  He was resumed on his home dose of Toprol, and two     days prior to discharge Diovan was added to his regimen.  At the time of     discharge the patient's blood pressure is moderately well controlled.  He     will be treated on those medications at the time of discharge.  Follow up     with primary care physician.   1. Depression and anxiety:  At the patient's previous hospitalization he was     evaluated by Dr. Rhona Raider, who diagnosed the patient with a major     depressive disorder with anxiety component.  Because of his chronic     medical issues, the patient has a tendency to develop needy behavior.     The patient is to continue his Remeron at 45 mg q.d. once at home.     Follow-up will be with his primary care physician as needed.   1. Anemia, multifactorial:  The patient had a slight drop in his hemoglobin     following surgery but remained hemodynamically stable.  Hemoglobin at the     time of discharge is 11.4.  This will be followed up on by his primary     care physician.  Gwendolyn Grant, M.D. [facility]    VL/MEDQ  D:  06/24/2002  T:  06/24/2002  Job:  [id number]

## 2011-01-21 NOTE — Cardiovascular Report (Signed)
NAMEVANDER, KUEKER            ACCOUNT NO.:  [id number]   MEDICAL RECORD NO.:  [id number]          PATIENT TYPE:  INP   LOCATION:  3705                         FACILITY:  [facility]   PHYSICIAN:  Bryson Dames, M.D.DATE OF BIRTH:  16-Jan-1966   DATE OF PROCEDURE:  11/16/2004  DATE OF DISCHARGE:                              CARDIAC CATHETERIZATION   PROCEDURES PERFORMED:  1.  Selective coronary angiography by Judkins technique.  2.  Retrograde left heart catheterization.  3.  Left ventricular angiography.   PROCEDURE NOTE:  The reader of this report should be made aware that this  procedure took place in two rooms on this particular day. The patient  initiated his cardiac catheterization in room 1, but due to failure of  cinefluoroscopy equipment in room 1, the patient had to be moved from room 1  into room 5. The remaining portion of procedure was completed there  including left ventricular angiograms and further views of the left anterior  descending coronary artery. The films are to be merged at a later time.  Currently they are being viewed on two monitors to try to complete today's  report.  This whole experience has been frustrating and difficult but is the  result of merging two different monitors and two different films from two  different rooms.   RESULTS:  PRESSURES:  The left ventricular pressure was 431/5, end-diastolic  pressure 11. Central aortic pressure 110/65 with a mean of 85. No aortic  valve gradient by pullback technique.   ANGIOGRAPHIC RESULTS:  A review of the films taken in room #1 show the  following results:  Left main coronary artery normal.   LAD courses the cardiac apex, gives rise to two diagonal branches. The first  diagonal branch arises well before first septal perforator and second  diagonal branch arises well after first septal perforator. Proximal LAD  normal. Mid LAD normal. Mid LAD after second diagonal branch shows a radio-  opaque stent  widely patent throughout with no further distal disease noted  and preservation of TIMI III flow. Diagonal branch #1 shows 80% stenosis mid  with a small vessel with a long area of distal runoff.   Second diagonal branch shows widely patent stent proximally with tortuosity  of vessel just after the stent and wide patency of this second diagonal  branch.   Left circumflex coronary artery is a large vessel giving rise to a couple of  posterolateral branches distally. No lesions are seen in this circumflex  coronary artery.   Right coronary artery contains a 30-40% area of luminal irregularity in the  mid portion of the vessel but the remainder of this large and dominant  vessel is normal.   Left ventricular angiogram was completed in room #5.  This shows that the LV  pumping chamber shows globally an ejection fraction estimated at 30-40%.  There was noted to be good anterobasal contractility, very poor wall motion  of the mid and lower anterolateral wall and apex. The apex was dyskinetic.  The inferoapical walls were also severely hypokinetic and the inferobasal  wall was mildly hypokinesia. I did  not observe any significant mitral  regurgitation. There was no LV thrombus formation.   FINAL IMPRESSION:  1.  Unstable angina pectoris as clinical presentation symptoms for this      patient at the time of current hospital admission  2.  Coronary artery disease status post left anterior descending stenting      and ostial second diagonal branch stenting with patency of both stents      and good runoff in both left anterior descending and diagonal #2.  3.  The disease in second diagonal branch of the left anterior descending is      80% and though a small vessel would be amenable to percutaneous      intervention.  4.  Left ventricular function abnormal as described above, 30-40%.   PLAN:  The patient should either have a stress test to assess the  possibility of anterolateral wall  ischemia or else should just be brought  back for reangiography of the first diagonal branch of the LAD and  consideration of stenting with a 2.0 stent, perhaps a Mini Vision.      WHG/MEDQ  D:  11/16/2004  T:  11/16/2004  Job:  [id number]   cc:   Delfino Lovett A. Rollene Fare, M.D.  [phone] [address], [place] [address]  Fax: [phone]

## 2011-01-21 NOTE — H&P (Signed)
[name], [name]            ACCOUNT NO.:  [id number]   MEDICAL RECORD NO.:  [id number]          PATIENT TYPE:  INP   LOCATION:  1832                         FACILITY:  [facility]   PHYSICIAN:  Richard A. Rollene [name], M.D.DATE OF BIRTH:  1966-07-09   DATE OF ADMISSION:  11/15/2004  DATE OF DISCHARGE:                                HISTORY & PHYSICAL   CHIEF COMPLAINT:  Nausea, vomiting, and chest pain.   HISTORY OF PRESENT ILLNESS:  [name] is a 45 year old male followed by  [name] and [name] with a history of coronary disease.  He  had an anterior MI treated with LAD Cypher stenting as well as diagonal  Cypher stenting in July 2005.  He was enrolled in the Triton study at that  time.  He was restudied in September for recurrent chest pain and underwent  intervention to an 80% circumflex lesion with a cutting balloon.  He last  saw [name] in October 2005.  He was doing well at that time.  He  called the office today complaining of nausea and vomiting this morning.  He  also related a history of chest pain Friday, Saturday, and Sunday.  He  describes this as indigestion.  At least one episode was responsive to  nitroglycerin.  There was no associated shortness of breath or diaphoresis  or arm pain.  The pain would last up to 40 minutes.  In the emergency room  now, he is pain free.   CURRENT MEDICATIONS:  1.  Lantus 60 units subcutaneously daily.  2.  Toprol XL 125 mg a day.  3.  Aspirin 81 mg a day.  4.  Neurontin 600 mg a day.  5.  Nexium 40 mg a day.  6.  Actos 45 mg a day.  7.  Triton study drug.  8.  Lipitor 40 mg a day.   PAST MEDICAL HISTORY:  1.  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  2.  He has had problems with chronic abdominal pain in the past, and he had      ruptured appendix in June 2003. This was followed by an ileostomy with a      reversal of his ileostomy in October 2003.  3.   Remote DVT of left lower extremity in March 2004 and was treated with      Coumadin, but subsequently this was stopped.  4.  There has been a question of Crohn's colitis on previous colonoscopy.      He has been followed by [name].  5.  He does have hypertension with normal renal arteries.   ALLERGIES:  No known drug allergies.   SOCIAL HISTORY:  He is married with two children.  He is a nonsmoker.  He is  disabled.   FAMILY HISTORY:  Remarkable for coronary disease; his father had an MI in  his 13s, and he has had several interventions and a bypass.  He has one  brother without coronary disease.   REVIEW OF SYSTEMS:  Essentially unremarkable except as noted above.  He has  had no melena or hematemesis.  He has not had unusual palpitations or  tachycardia or syncope.  He denies any fever or chills.  He did have a viral  illness about two weeks or so ago but has recovered.   PHYSICAL EXAMINATION:  VITAL SIGNS:  Blood pressure 122/80, pulse 76,  respirations 16, temperature 97.  GENERAL:  Well-developed, well-nourished African-American male in no acute  distress.  HEENT:  Normocephalic.  Extraocular movements intact.  Sclerae anicteric.  Lids and conjunctivae within normal limits.  He does wear glasses.  NECK:  Without JVD and without bruit.  CHEST:  Clear to auscultation and percussion.  CARDIAC:  Regular rate and rhythm without obvious murmur, rub, or gallop.  Normal S1 and S2.  ABDOMEN:  Not distended.  He has midline surgical scar.  He is not  particularly tender.  EXTREMITIES:  Without edema.  Pulses are diminished bilaterally.  NEUROLOGIC:  Grossly intact.  He is awake, alert, oriented, and cooperative.  Moves all extremities.   LABORATORY DATA AND OTHER STUDIES:  EKG shows sinus rhythm, nonspecific ST  changes.   Labs are pending.   IMPRESSION:  1.  Unstable angina.  2.  Coronary disease with an anterior myocardial infarction July 2005,      treated with left  anterior descending and diagonal Cypher stenting with      subsequent circumflex cutting balloon in September 2005.  3.  Mild left ventricular dysfunction with ejection fraction of 45 to 50%.  4.  Diabetes with neuropathy and gastroparesis.  5.  Hypertension with normal renal arteries.  6.  Gastroesophageal reflux disease and history of questionable Crohn's      colitis followed by [name].  7.  History of deep vein thrombosis of left lower extremity in March 2004.  8.  Ruptured appendix June 2003.   PLAN:  1.  Will go ahead and admit [name] to telemetry.  2.  Will rule him out for an MI.  3.  Will not place him on heparin yet pending labs.  4.  He will be kept on clear liquids.  5.  We may want to consider the addition of Reglan to his current      medication.      LKK/MEDQ  D:  11/15/2004  T:  11/15/2004  Job:  [id number]

## 2011-01-21 NOTE — Discharge Summary (Signed)
[facility]  Patient:    [name], [name] Visit Number: [id number] MRN: [id number]          Service Type: MED Location: [phone] Attending Physician:  Rosezetta Schlatter Dictated by:   Alfredia Ferguson, P.A.-C. Admit Date:  01/23/2002 Discharge Date: 01/29/2002                             Discharge Summary  ADMITTING DIAGNOSES: 64. A 45 year old African-American male with insulin-dependent diabetic with a    two-week history of intermittent nausea and vomiting, now progressive with    nausea, vomiting, and left-sided abdominal pain; rule out partial    obstruction, rule out progression of Crohns, colitis or enteritis; rule    out viral syndrome; rule out gastroparesis, though feel less likely with    acute onset of pain. 2. Insulin-dependent diabetes mellitus. 3. Hypertension. 4. Hyperlipidemia. 5. History of gastroesophageal reflux disease. 6. History of narcolepsy. 7. Status post right inguinal hernia repair.  DISCHARGE DIAGNOSES: 1. Resolved nausea, vomiting, and left abdominal pain - etiology not clear.    Question gastroenteritis versus gastroparesis. 2. Poorly controlled insulin diabetes mellitus with significantly elevated    hemoglobin A1C. 3. Other diagnoses as listed above.  CONSULTATIONS:  None.  PROCEDURES:  CT scan of the abdomen and pelvis and EGD per Dr. Delfin Edis.  LABORATORIES:  Jan 15, 2002:  WBC of 8.2, hemoglobin 14.4, hematocrit of 42.5, MCV 82.2, platelets 243.  Follow-up on May 14:  WBC of 6.9, hemoglobin 13.8, hematocrit of 41.2, platelets 240, SED rate 15.  Electrolytes within normal limits.  Glucose 291, BUN 8, creatinine 0.7, albumin 3.9.  Follow-up on May 14: potassium 3.2, glucose 180.  Liver function studies within normal limits.  Amylase 37, lipase 13, glycosylated hemoglobin 12.8.  UA greater than a 1000 glucose, specific gravity 1.04, otherwise negative.  X-RAY STUDIES:  CT scan of the abdomen and  pelvis on Jan 15, 2002 was negative with an area of attenuation in the left lateral posterior urinary bladder, probably representing contrast of doubtful significance.  HOSPITAL COURSE:  [name] is a 45 year old African-American male known to Dr. Loralee Pacas. [name], also to Dr. Gloris Ham with a history as described above.  The patient had undergone a work-up last spring in 2002 for complaints of hematochezia and intermittent epigastric pain.  He had undergone EGD at that time, which was unremarkable; upper abdominal ultrasound also negative; small-bowel follow-through, which was normal; and then sigmoidoscopy with finding of some abnormal rectal mucosa concerning for a possible tumor. This was followed up by colonoscopy in June 2002, which showed multiple rectal polyps, which appeared to be inflammatory in nature and extended into the anal canal.  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  Dr. Loralee Pacas. [name] had reviewed the pathology and also checked markers for IBD, which were positive with a positive ASCA consistent with Crohns.  He was then started on Pentasa and ______ suppositories in September 2002.  He had not been seen by GI since that time and had also not seen Dr. Loanne Drilling for multiple months when he presented to the emergency room with a two-week history of nausea, which had been intermittent lasting just several hours at a time and eventually having nausea and vomiting over the past 48 hours.  He came to the emergency room and was given some fluids  and antiemetics.  He initially felt better after being seen by the ER doctor, but then developed left-sided abdominal pain and it was felt that he should be evaluated by GI.  He was seen and evaluated continuing to complain of fairly severe left-sided abdominal discomfort and persistent nausea, and was admitted to the hospital  for further diagnostic evaluation.  He underwent CT scan of the abdomen and pelvic, which was negative.  He had a benign hospital course and his abdominal discomfort resolved that evening, and he was asking for p.o.  His diet was then gradually advanced.  He was able to tolerate liquids and then full liquids without difficulty.  We did obtain a hemoglobin A1C, which was markedly elevated consistent with poor control of his diabetes.  He underwent EGD the following morning, which was normal.  His diet was then advanced and plans were made for discharge to home that afternoon.  The patient was feeling improved at the time of discharge and tolerating solid diet.  He was instructed to follow up with Dr. Loanne Drilling on May 19 at 9:15 a.m. for review of his insulin regimen.  He was given a follow-up appointment with Dr. Sharlett [name] on June 5 at 11 a.m. and to call for any problems in the interim.  We also scheduled him for an outpatient small-bowel follow-through at [facility] on May 21 at 9 a.m. because of the previous question of Crohns.  MEDICATIONS ON DISCHARGE:  Phenergan 25 mg q.6h. p.r.n. nausea and vomiting, Nexium 40 mg p.o. q.d., Pentasa 250 mg three p.o. t.i.d., Ritalin as previous, Provigil as previous, insulin regimen as previous which was Humalog 30 units q.a.m., 60 units q.lunch and 45 units q.p.m. and then Humalog N 30 units q.p.m., Lipitor 80 mg q.d. as previous and Toprol XL 50 mg q.d. as previous. He was to follow a 1800 calorie ADA diet. Dictated by:   Alfredia Ferguson, P.A.-C. Attending Physician:  Rosezetta Schlatter DD:  02/07/02 TD:  02/07/02 Job: [id number]

## 2011-01-21 NOTE — Discharge Summary (Signed)
[facility]  Patient:    [name], [name] Visit Number: [id number] MRN: [id number]          Service Type: MED Location: [phone] Attending Physician:  Gwendolyn Grant Dictated by:   Ward Givens, M.D. Admit Date:  03/14/2002 Discharge Date: 03/20/2002                             Discharge Summary  HISTORY OF PRESENT ILLNESS:  The patient is a 45 year old black male with a diagnosis of Crohns disease that had intermittent abdominal pain for a month. I saw him in the emergency department with increasingly severe pain.  A CT scan showed evidence of perforated viscus.  There was also evidence of abscess in the right side of the abdomen.  See the history and physical for further details.  The patient is not on steroids.  Other problems are diabetes mellitus, for which he is on insulin and Actos, and high blood pressure.  PHYSICAL EXAMINATION:  Remarkable for a very tender abdomen, tachycardia, evidence of dehydration.  HOSPITAL COURSE:  On the day of admission, and after giving him IV fluids, IV antibiotics, and pain medications, I took him to the operating room and found very bad peritonitis and abscess on the right side of the abdomen.  It would seem likely that he had gangrenous appendix, but Crohns disease could not be excluded, and other causes of colon perforation could not be excluded also.  I performed a right colectomy with ileostomy and stapling of the distal bowel. I obtained a medical consultation for management of diabetes, and he was seen by Dr. Arnoldo Morale and his associates.  They followed throughout the hospitalization.  The patient was quite ill at first, but he made steady improvement.  The ileostomy became quite pink and functioned well.  His wound had been left mostly open developed no sign of infection.  His GI function returned fairly slowly, and he was started on TPN after time.  He tolerated that well despite his diabetes.   He did develop evidence of right-sided abdominal abscess, and underwent drainage of that under CT guidance on 02/20/02.  That again made him feel better, and he steadily improved after that.  He felt well enough to go home on 02/26/02.  A CT scan the day before had shown resolution of abscess.  Drain was removed prior to his discharge. He was to resume his home medications.  He was to follow up with me in two to three weeks.  DIAGNOSES: 1. Perforated appendix with peritonitis and abscess. 2. Crohns disease. 3. Diabetes mellitus, type 2 insulin dependent. 4. Hypertension.  OPERATION: 1. Right colectomy with ileostomy and stapling of the distal segment. 2. Percutaneous drainage of abdominal abscess.  CONDITION ON DISCHARGE:  Improved. Dictated by:   Ward Givens, M.D. Attending Physician:  Gwendolyn Grant DD:  03/19/02 TD:  03/22/02 Job: [id number]

## 2011-01-21 NOTE — Cardiovascular Report (Signed)
NAMEBENNET, [name]            ACCOUNT NO.:  [id number]   MEDICAL RECORD NO.:  [id number]          PATIENT TYPE:  OIB   LOCATION:  6527                         FACILITY:  [place]   PHYSICIAN:  Richard A. Rollene Fare, M.D.DATE OF BIRTH:  [birth year]   DATE OF PROCEDURE:  05/28/2004  DATE OF DISCHARGE:  05/29/2004                              CARDIAC CATHETERIZATION   PROCEDURE:  Retrograde central aortic catheterization, selective coronary  angiography, pre- and post IC nitroglycerin administration, LV angiogram  RAO/LAO projection, weight adjusted heparin, Aggrastat bolus plus infusion,  cutting balloon atherectomy, high grade mid circumflex stenosis.  Continued  aspirin and TRITON antiplatelet study drug.   COMPLICATIONS:  None.   BRIEF HISTORY:  Mr. 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  He is a nonsmoker, has a history of  insulin dependent diabetes, hyperlipidemia, remote DVT, systemic  hypertension and coronary artery disease.  He suffered an acute anterior  wall myocardial infarction on April 04, 2004, and was transferred from urgent  care to [facility].  He underwent emergency catheterization and had total LAD  stenosis beyond the large first diagonal at the junction of the proximal  third of the LAD and high grade 95% ostial and mid DX1 stenosis.  He had  noncritical coronary disease of his circumflex and right coronary artery and  acute EF of approximately 40 to 45%.  He had successful emergency  intervention on the TRITON study (alternative antiplatelet therapy versus  platelets) and had tandem stenting of his mid DX1, LAD across the DX1 and an  ostial overlapping DX1 stent through the LAD stent.  He had reperfusion time  of nine hours and 57 minutes and anteroapical wall motion abnormality.  He  had uncomplicated hospital course, has been on aspirin and study drug and  has been in cardiac rehab.  He has had intermittent  atypical chest pains but  was recently seen in the office for left precordial chest pain and  substernal discomfort that he felt was similar to his chest pain prompting  his hospital admission with acute MI.  A previous baseline Cardiolite was  abnormal with inferoapical and anteroseptal scar and mild superimposed  ischemia with EF of 48%.  In this setting, the patient was admitted for  repeat angiography.   DESCRIPTION OF PROCEDURE:  The patient was brought to the second floor CP  lab in a postabsorptive state after 5 mg valium p.o. premedication.  He was  given 2 mg of Nubain for sedation in the laboratory, 1% Xylocaine was used  for local anesthesia.  The CRFA was entered with single atrial puncture  using an 18 thin wall needle and a 6 French right leg side arm sheath was  inserted without difficulty.  Diagnostic coronary angiography was done with  6 French 4 cm taper, preformed Cordis coronary and pigtail catheters using  Omnipaque dye throughout the procedure.  IC nitroglycerin 200 mcg was given  into the left coronary artery with repeat injections obtained.  LV angiogram  was done in the RAO and LAO projection through a pigtail catheter,  25 mL 14  mL/second for each projection.  Pullback pressure of CA showed no gradient  across the aortic valve.   Patient had prior normal abdominal aorta and renal arteries on last  angiogram of April 04, 2004.  The patient tolerated the diagnostic procedure  well.   PRESSURES:  LV:  120/8; LVDP 18-22 mmHg.  CA:  120/78 mmHg.  There was no gradient across the aortic valve on catheter  pullback.   Fluoroscopy showed mild +1 LAD proximal calcification.  No significant right  or circumflex calcification or intracardiac.  The previously placed diagonal  and LAD stents were seen fluoroscopically.  There was no intracardiac or  valvular calcification.   LV angiogram demonstrated hypokinesis of the mid anterolateral wall, hypo-  akinesis from the  distal third of the anterolateral wall through the distal  third of the inferior wall with small paradoxical bulge.  There was hypo-  akinesis of the mid septum to the distal posterior wall.  There was no  significant mitral regurgitation.  Estimated EF was approximately 45% and  calculated EF was 50%.   The main left coronary was normal.  The left anterior descending was widely  patent throughout the stented area.  There was mild 30% narrowing of the mid  portion beyond the septal perforator __________  artery and another 30 to  40% narrowing in the distal third of the LAD with good residual lumen.  The  LAD across the apex of the heart was bifurcated with TIMI-3 flow.  The  previously placed stent overlapping the large DX1 was widely patent with no  significant stenosis and less than 10% narrowing.   The ostial and proximal __________  DX1 stenting was essentially widely  patent with less than 10% narrowing throughout.  The mid portion of the DX1  beyond the stented area had 50 to 60% narrowing and it bifurcated distally  and there was normal flow.   The optimal diagonal was a thin vessel that had 80% narrowing at its mid  portion, slight __________  before the distal bifurcation, probably  unchanged from prior angiography.   The small OM1 had 60% ostial narrowing.  There was another small OM2 that  had no significant stenosis.  There was a large OM3 with no significant  stenosis and just beyond this  there was an area of 80 to 85% eccentric  narrowing that was visualized in the RAO but best seen in the LAO  projection.  There was good vessel beyond this and two large distal marginal  branches without significant disease.   The right coronary had 40% narrowing in the mid portion before the large RFV  branch and another 30 to 40% narrowing just beyond the acute margin.  The PLA trifurcated, was large and normal and there were two PDA branches that  were normal.   This patient has  a residual high grade eccentric stenosis of the mid  circumflex beyond the OM3 branch.  This may be his culprit lesion for his  recurrent angina.  His Cardiolite shows scarring and some peri-infarct  ischemia and is difficult to correlate this with his circumflex lesion but  it certainly correlates with this recent AWMI.  It is widely patent.  Tandem  ostial and proximal DX1 stent and LAD stent overlapping the diagonal.  The  diagonal stents were 2.5/8 plus 2.5/13 CYPHER stents and the LAD stent was a  3.0/13 CYPHER stent (April 04, 2004) with essentially 0 to 10% narrowing.  PLAN:  PCI of his mid circumflex stenosis in this clinical setting.   Informed consent had been obtained to proceed.  The patient was stable.  He  was given double Aggrastat bolus plus infusion.  He was continued on his  TRITON study drug.  Initially the left coronary was intubated with the CYMED  3.5 guiding catheter and an Asahi one 4 inch soft guidewire was used across  the stenosis.  There was a relatively long left main with a near right angle  turned to the circumflex so there was concern about backup.  Initial  attempts of trying to pass a 2.5/10 cutting balloon pushed the guiding  catheter back and for this reason this system was removed.  It was then  replaced with 6 [place] Cordis JB3.0 guiding catheter which gave much better  backup.  The same wire was used to cross the stenosis and was free in the  distal circumflex.  The lesion was then crossed with a 2.5/10 Flextome CYMED  cutting balloon and dilatation was performed at low pressures of 4-34 and 5-  34.  Balloon was pulled back in final injections and multiple projection  showed excellent angiographic stentlike result with mild haziness but no  dissection.  There was initially some pinching of the OM3 but this was then  tapped in multiple views with good TIMI-3 flow.  The stenosis was reduced  from 80-85% to less than 20%.  It was elected to accept this  result in this  setting and not jeopardize the OM3 side branch with this good angiographic  result.  The patient was stable without chest pain.  Final ACT was 250  seconds.  The dilatation system was removed. Side arm sheath was flushed and  secured to the skin with #1 silk suture to prevent migration.  The patient  was brought to the holding area for postoperative care in stable condition.  We will continue with study drug, aspirin and associated medications.  He  has had good short term result of his LAD diagonal with recovery of LV  function with an EF now of 45 to 50% and some residual wall motion  abnormalities.  Today he has had successful cutting balloon atherectomy of  the mid circumflex lesion.  He is a candidate for ancillary upper GI  medication as well.  His blood pressure is under good control and he is followed medically by Dr. Granville Lewis for his diabetes and hypertension.   CATHETERIZATION DIAGNOSES:  1.  Arteriosclerotic heart disease status post anterior wall myocardial      infarction treated with emergency percutaneous coronary intervention on      April 04, 2004, with DS stenting of the left anterior descending and side      branch ostial and mid DS stenting of the first diagonal as outlined      above.  2.  Improved left ventricular function with current ejection fraction 45 to      50% segmental wall motion abnormality.  3.  Recurrent angina. compatible with ischemia, abnormal Cardiolite.  4.  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  5.  Residual noncritical dominant right coronary artery stenosis and 80%      thin mid OD stenosis.  6.  Insulin-dependent diabetes mellitus.  7.  Remote deep venous thrombosis, no recurrence.  8.  Hyperlipidemia on therapy.  9.  Systemic hypertension past normal renal arteries (  July 2005).  10. Diabetic peripheral neuropathy on  chronic Neurontin.  11. Exogenous obesity.  12. Probable gastroesophageal reflux disease.       RAW/MEDQ  D:  05/28/2004  T:  05/29/2004  Job:  [id number]   cc:   Darletta Moll. Bubba Hales, M.D.  [address]  [place]  [place] [address]  Fax: [phone]

## 2011-01-21 NOTE — H&P (Signed)
NAMEAVANT, PRINTY                        ACCOUNT NO.:  [id number]   MEDICAL RECORD NO.:  [id number]                   PATIENT TYPE:  INP   LOCATION:  8144                                 FACILITY:  [facility]   PHYSICIAN:  Darrick Penna. Linna Darner, M.D. [facility]         DATE OF BIRTH:  [birth year]   DATE OF ADMISSION:  04/20/2002  DATE OF DISCHARGE:                                HISTORY & PHYSICAL   HISTORY OF PRESENT ILLNESS:  Mr. Mcintire is a 45 year old African-American  diabetic admitted with nausea and vomiting over the last three to four days,  suggestive of partial small bowel obstruction. He had an appendectomy on  February 12, 2002 here at [facility]. Apparently, the appendix had ruptured and  there were complications of peritonitis. Since that time, he has had three  drainage procedures of abscesses, two at [facility] on July 30 until April 09, 2002 and one at [facility] apparently on July 3 and March 14, 2002. He is not exactly sure of these dates and those old charts are not  available but have been requested. He was discharged from this hospital on  April 09, 2002 after drain was inserted for the abscess. It was removed as  an outpatient at Dr. Thedora Hinders office on April 12, 2002. Beginning April 17, 2002 he then began to experience nausea. On April 18, 2002 he had actual  vomiting four times. Over the ensuing 48 to 72 hours, he has had progressive  nausea and vomiting up to 8 to 12 times per day. He has had chills but no  sweats or fevers. He was in the emergency room at [facility] on August 15  and August 16 and treated and released. 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 Apparently this was diagnosed by Dr. Carmel Sacramento in  [place], whom he has not seen recently. He has had regurgitation of yellow  material but denies any purulence in head or chest. He has had no cardiac or  pulmonary symptoms. He has had no change in his urinary symptoms.   PAST MEDICAL  HISTORY:  Includes Crohn's disease of the rectal area. He also  has hypertension, insulin dependent diabetes mellitus, and hyperlipidemia.  In 1982, he had a right inguinal hernia repair.   MEDICATIONS:  1. Toprol XL 50 mg daily.  2. Lipitor 80 mg daily.  3. Lantus insulin 30 units at bedtime.  4. Provigil 200 mg daily.  5. Nexium 40 mg daily.  6. Phenergan 25 mg every six hours as needed.  7. Pentasa 230 mg three tid.  8. Ritalin 10 mg daily.   ALLERGIES:  No known drug allergies.   FAMILY HISTORY:  Colitis in a sister. Colon polyps in mother. Myocardial  infarction and diabetes mellitus in father. There is no family history of  cancer.   SOCIAL HISTORY:  He does not drink or smoke. He is presently  unemployed. He  formerly worked as a Scientist, water quality.   LABORATORY DATA:  His sugars have been averaging less than 230 in the  morning. He has not been employing any insulin other than Lantus although he  has  apparently used regular forms.   PHYSICAL EXAMINATION:  GENERAL: He appears uncomfortable. Intermittently  gloom is seen.  VITAL SIGNS: Temperature 97.3, blood pressure 123/86, pulse 87, respiratory  rate 16.  SKIN: Warm and dry.  HEENT:  There is no lymphadenopathy about the head, neck or axilla.  CHEST: Clear with only minimal rales at the bases.  HEART: He has an S4 with no significant murmurs.  ABDOMEN: Soft with an ostomy bag. Bowel sounds are essentially absent except  for rare tinkling bowel sound.  GI: Pelvic examination unremarkable.  NEURO: Examination unremarkable.  EXTREMITIES: Pale pulses of DT's particularly the posterior tibial pulses.  There is generalized muscular atrophy of the limbs.   ASSESSMENT/PLAN:  Despite Reglan and Dilaudid, he continues to have pain. He  will be admitted with IV fluids and pain control. CAT scan of the abdomen  will be performed. Dr. Thedora Hinders group will be consulted. At this time, as  noted, he is afebrile and has a normal white count.  He is anemic with a  hematocrit of 30.8. Chemistries revealed a glucose of 188 but it is  otherwise normal.                                               Darrick Penna. Linna Darner, M.D. [facility]    WFH/MEDQ  D:  04/21/2002  T:  04/22/2002  Job:  [id number]   cc:   Hilliard Clark A. Loanne Drilling, M.D. [facility]   Bluford Main., M.D.  Fax: [phone]

## 2011-01-21 NOTE — Discharge Summary (Signed)
NAMEQUNICY, HIGINBOTHAM            ACCOUNT NO.:  [id number]   MEDICAL RECORD NO.:  [id number]          PATIENT TYPE:  INP   LOCATION:  9166                         FACILITY:  [place]   PHYSICIAN:  Myrtice Lauth         DATE OF BIRTH:  1966/03/10   DATE OF ADMISSION:  11/15/2004  DATE OF DISCHARGE:  11/18/2004                                 DISCHARGE SUMMARY   DISCHARGE DIAGNOSES:  1.  Coronary disease, Cypher stenting this admission to the first diagonal.  2.  Prior history of anterior myocardial infarction in July 2005, treated      with left anterior descending artery and diagonal Cypher stenting and a      circumflex cutting balloon in September 2005.  3.  Mild left ventricular dysfunction with an ejection fraction of 45-50%.  4.  Diabetes with neuropathy and gastroparesis.  5.  Hypertension with normal renal arteries.  6.  Gastroesophageal reflux disease.  7.  History of deep venous thrombosis in March 2004.  8.  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   HOSPITAL COURSE:  The patient is a 45 year old male followed by Dr.  Rollene Fare and Dr. Granville Lewis with a history of coronary disease, as  described above.  He was admitted on November 15, 2004 with chest pain,  consistent with unstable angina.  He has a past medical history of diabetes  and chronic abdominal pain.  He had a ruptured appendix in June, 2003.  He  had an ileostomy and then a reversal in October, 2003.  He was admitted  through the emergency room.  He was started on IV heparin and nitrates and  set up for a diagnostic catheterization.  His enzymes were negative.  Catheterization done by Dr. Melvern Banker on November 16, 2004 showed an 80% diagonal  stenosis.  The LAD and second diagonal stent sites were patent.  The RCA had  a 40% narrowing, and the circumflex was normal.  His EF was 30-40%.  He  underwent Cypher stenting to the first diagonal lesion on November 17, 2004,  and tolerated this well.   Dr. Melvern Banker felt he could be discharged on November 18, 2004 to follow up with Dr. Rollene Fare in the office.   DISCHARGE MEDICATIONS:  1.  Aspirin 81 mg a day.  2.  Toprol-XL 12.5 mg a day.  3.  Lantus 60 units a day.  4.  Neurontin 600 mg a day.  5.  Nexium 40 mg a day.  6.  Actos 45 mg a day.  7.  Lipitor 40 mg a day.  8.  Triton study drug daily.  9.  Accupril 20 mg a day.  10. Nitroglycerin sublingual p.r.n.   LABORATORIES:  EKG shows sinus rhythm, anterior septal Q waves.   Chest x-ray shows no active disease.   White count 5.5, hemoglobin 12.4, hematocrit 35.8, platelets 229 at  discharge.  Sodium 138, potassium 3.7, BUN 12, creatinine 0.8.  Liver  functions are normal.  CK-MB and troponins are negative.  TSH is 0.73.  Urinalysis was unremarkable.   DISPOSITION:  The patient is discharged in stable condition and will follow  up with Dr. Rollene Fare as noted.  The patient was not discharged on Plavix,  as he is in the Triton study.      LKK/MEDQ  D:  01/20/2005  T:  01/20/2005  Job:  [id number]   cc:   Delfino Lovett A. Rollene Fare, M.D.  [phone] [address], [place] [address]  Fax: [place]. Bubba Hales, M.D.  [address]  [place]  [place] [address]  Fax: [phone]

## 2011-01-21 NOTE — Discharge Summary (Signed)
[name], [name]                      ACCOUNT NO.:  [id number]   MEDICAL RECORD NO.:  [id number]                   PATIENT TYPE:  INP   LOCATION:  5631                                 FACILITY:  [facility]   PHYSICIAN:  Sandy Salaam. Deatra Ina, M.D. [facility]          DATE OF BIRTH:  Jan 31, 1966   DATE OF ADMISSION:  07/26/2002  DATE OF DISCHARGE:  07/31/2002                                 DISCHARGE SUMMARY   ADMITTING DIAGNOSES:  51. A 45 year old male with recurrent intractable nausea and vomiting with     abdominal pain and diagnosis of gastroparesis, question current episode     secondary to refractory gastroparesis versus underlying infection,     possibly viral.  Rule out functional component.  2. Insulin-dependent diabetes mellitus.  3. Weight loss 80 pounds over the past one year.  4. Questionable diagnosis of Crohn's disease with positive markers and     negative pathology thus far.  5. Hypertension.  6. Anxiety and depression.  7. Status post appendectomy, ileostomy and reversal of ileostomy October     2003.  8. Status post right inguinal hernia repair.  9. Gastroesophageal reflux disease.  10.      Recent narcotic dependence.   DISCHARGE DIAGNOSES:  1. Recurrent episode of nausea and vomiting and abdominal pain with no new     findings, symptomatically improved.  Felt secondary to gastroparesis and     possibly pain medication seeking behavior.  2. Insulin-dependent diabetes mellitus.  3. Weight loss 80 pounds over the past one year.  4. Questionable diagnosis of Crohn's disease with positive markers and     negative pathology thus far.  5. Hypertension.  6. Anxiety and depression.  7. Status post appendectomy, ileostomy and reversal of ileostomy October     2003.  8. Status post right inguinal hernia repair.  9. Gastroesophageal reflux disease.  10.      Recent narcotic dependence.  11.      Hypokalemia, recurrent, corrected.   CONSULTATIONS:  [facility] Internal Medicine  hospital service.   PROCEDURE:  Chest x-ray, plain abdominal films.   HISTORY:  The patient is a 45 year old African-American male with multiple  admissions over the past year who carries the diagnosis of diabetic  gastroparesis, also felt in the past to be medication induced and this has  been refractory.  He is felt to have significant anxiety and depression with  functional overlay to his symptoms.  He also has hypertension, a chronic  pain syndrome, and was recently weaned off of his narcotics as of his last  admission November 7 through July 20, 2002.  He had sustained a  perforated appendix in June 2003 requiring an ileostomy.  Had ileostomy  reversal by Dr. Deon Pilling in October 2003.  Also has history of GERD and a  prior right inguinal hernia repair.  There has been some suspicion of  Crohn's disease per prior colonoscopy, but no definite evidence by  pathology  and no evidence at the time of his recent surgery, though he does have  positive IBD markers.  He was discharged last on November 17 with high dose  Reglan 20 mg q.i.d. and also on Zelnorm 6 mg b.i.d. and off of narcotics.  At that time he had had a gastric emptying scan which was improved showing  only 40% retention at two hours.  He was also seen by Dr. Rhona Raider for  psychiatry during his last admission and had several changes made in his  psychiatric medications.   The patient now represents with recurrent nausea and vomiting over the past  48 hours stating that he has been unable to keep down any p.o.'s, complains  of feeling weak and increased abdominal pain.  No documented fever, but had  had some chills.  No diarrhea, melena, or hematochezia.  No other new  symptoms.  Complains of pain on the right side of his abdomen which is his  usual complaint.  He is readmitted for supportive care and at this time will  consider a referral to [facility] with Dr. Faythe Dingwall for further evaluation of  refractory gastroparetic  symptoms.   LABORATORY STUDIES:  On admission WBC 6.1, hemoglobin 11.3, hematocrit 33.7,  MCV 82.2, platelets 300,000.  Follow-up on November 22 WBC 7.4, hemoglobin  12, hematocrit 36.1.  Electrolytes on admission:  Sodium 136, potassium 3,  BUN 8, creatinine 0.6, glucose 107, albumin 3.3.  Follow-up on November 22  potassium was 2.7.  Follow-up on November 24 potassium 3.2 and on November  25 potassium 3.7.  Glucose remained normal for the most part, on November 24  glucose 138.  Liver function studies normal.  Lipase 18.  Glycosylated  hemoglobin 6.0.  Cholesterol 173, triglycerides 70, HDL 70, LDL 89.  TSH  2.47, B12 351, folate 320.  Urinalysis negative.   X-ray studies:  Chest x-ray negative, mild scarring of the right lung base.  KUB unremarkable.   HOSPITAL COURSE:  The patient was admitted to the service of Dr. Fuller Plan.  He  was initially placed on IV fluids with potassium supplementation.  Started  on IV Reglan, IV Protonix, and clear liquid diet.  He was given Zofran IV as  well and was requesting pain medication which we gave him on a couple of  occasions, but once again, multiple discussions were had regarding the  adverse effect of pain medication on his gastroparesis.  He was given IV  Ativan instead which he seems to benefit from as well.  He was continued on  his other home medications.  Had a benign hospital course.  He was seen in  consultation by the [facility] Internal Medicine service for follow-up of his  hypertension and diabetes with no new changes in his home regimen.  The  patient has recently been established with Dr. Bubba Hales with [place],  but has not seen him in the office as yet.  Pain medications were withheld  after July 27, 2002 and his diet was gradually advanced which he seemed  to tolerate.  By November 26 he was felt to be ready for discharge, though asking for Demerol and Zofran prior to discharge.  This was declined and he  was allowed  discharge to home with instructions to follow up with Dr. Bubba Hales  in his office on December 1, to follow up with Dr. Philip Aspen for GI as  previously scheduled.  He was also scheduled an appointment with Dr. Faythe Dingwall at  [facility] on January 7 at 4 p.m.   DIET:  ADA low residue as previous.   DISCHARGE MEDICATIONS:  1. Metoprolol 50 mg q.d.  2. Zelnorm 6 mg b.i.d.  3. Reglan 20 mg q.i.d. a.c. and h.s.  4. Ativan 1 mg t.i.d. if needed.  5. Lantus insulin 38 units h.s.  6. Actos 45 mg q.a.m.  7. Protonix 40 mg q.d.  8. Seroquel 50 q.h.s.  9. Lisinopril 80 mg q.d.  10.      Remeron 30 mg h.s.  11.      Effexor 75 q.d.  12.      Pindolol 2.5 t.i.d.  13.      Phenergan 25 mg p.o. q.6h. p.r.n.  14.      Clonidine 0.1 h.s.  This was a new medication as of this admission.   CONDITION ON DISCHARGE:  Stable.     Nicoletta Ba, P.A.-C. [facility]                Robert D. Deatra Ina, M.D. [facility]    AE/MEDQ  D:  08/19/2002  T:  08/19/2002  Job:  [id number]   cc:   Darletta Moll. Bubba Hales, M.D.  [address]  [place]  [place] [address]  Fax: [phone]

## 2011-01-21 NOTE — Discharge Summary (Signed)
[place]  Patient:    [name], [name] Visit Number: [id number] MRN: [id number]          Service Type: MED Location: [phone] Attending Physician:  Gwendolyn Grant Dictated by:   Helayne Seminole, P.A. Admit Date:  03/14/2002 Discharge Date: 03/20/2002   CC:         Gloris Ham, M.D. [facility]  Ward Givens, M.D.   Discharge Summary  DISCHARGE DIAGNOSES: 1. Intractable vomiting. 2. Right abdominal abscess. 3. Hyperglycemia.  HISTORY OF PRESENT ILLNESS: [name] is a 46 year old male who was recently hospitalized with a perforated right colon secondary to a gangrenous appendicitis and peritonitis. He later developed a right sided abscess. The patient is status post right hemicolectomy on February 12, 2002. The patient had improved but required repeat hospitalization secondary to abscess formation during which time he had a percutaneous drain placed.  PAST MEDICAL HISTORY: 1. Adult onset diabetes mellitus type II. 2. ADHD. 3. Dyslipidemia. 4. Hypertension. 5. Obesity. 6. Diabetic nephropathy. 7. Noncardiogenic chest pain. 8. Crohns disease. 9. Diabetic gastroparesis.  HOSPITAL COURSE: #1 - INFECTIOUS DISEASE: As noted earlier, the patient is status post right hemicolectomy secondary to a perforated right colon from a gangrenous appendicitis and peritonitis. It was noted that he developed an abscess and required CT guided percutaneous drainage. He had a JP drain placed on March 07, 2002 secondary to reaccumulation of fluid. His last CT prior to this admission was on March 11, 2002 and did not show any reaccumulation. Surgery was asked to see and follow the patient with [place]. They recommended repeat CT scan. This was performed on March 16, 2002 and per surgery, showed persistent abscess but no reaccumulation of fluid. The patients drain was injected with contrast dye on March 18, 2002 and again this showed accumulation of fluid  and the abscess cavity was almost resolved. The patient is nearing the point where the drain can be discontinued. The patients cultures from his previous hospitalization grew both e-coli and microaerophilic strep. The patient did have fluid recultured during this hospitalization on March 18, 2002 and this culture revealed PMN, yeast, and gram positive cocci in pairs and chains. The patient had been on Cipro but surgery recommended changing the Augmentin to cover possible enterococcus. We also used Diflucan as well. The patient is afebrile and is stable from an infectious disease and surgery standpoint for discharge.  #2 - ADULT ONSET DIABETES MELLITUS: The patient was on 100 mg on Lantus q.h.s. at home prior to this admission and reportedly he presented with some hypoglycemia. I am reluctant to resume this dose at discharge and we will reduce his Lantus dose and titrate it up as needed.  #3 - GASTROINTESTINAL: The patient presented with intractable vomiting. This is probably secondary to gastroparesis and/or a little underlying gastroenteritis but this has also resolved and he is currently tolerating a regular diet.  LABORATORY DATA: Abscess culture, final results still pending. Sodium 133, BUN and creatinine were normal. LFTs were normal. Hemoglobin 10.2. White count was normal.  DISCHARGE MEDICATIONS: 1. Reglan 10 mg q.a.c. and h.s. 2. Accupril 40 mg b.i.d. 3. Toprol XL 50 mg q.d. 4. Lipitor 80 mg q.d. 5. Pentasa 750 mg t.i.d. 6. Diflucan 100 mg q.d. for seven days. 7. Augmentin 875 mg one p.o. b.i.d. for seven days.  FOLLOW-UP: With Dr. Deon Pilling in one to two weeks and Dr. Loanne Drilling in one to two weeks. Dictated by:   Helayne Seminole, P.A. Attending Physician:  Gwendolyn Grant DD:  03/20/02 TD:  03/24/02 Job: [id number]

## 2011-01-21 NOTE — Discharge Summary (Signed)
[name], [name]                        ACCOUNT NO.:  [id number]   MEDICAL RECORD NO.:  [id number]                   PATIENT TYPE:  INP   LOCATION:  0448                                 FACILITY:  [facility]   PHYSICIAN:  Gwendolyn Grant, M.D. [facility]          DATE OF BIRTH:  1966/04/04   DATE OF ADMISSION:  04/26/2002  DATE OF DISCHARGE:  05/08/2002                                 DISCHARGE SUMMARY   DISCHARGE DIAGNOSES:  1. Abdominal pain with nausea and vomiting, resolved.  2. Type 2 diabetes with severe gastroparesis.  3. Crohn's disease.  4. Hypertension.  5. Electrolyte abnormalities, resolved.  6. Status post pelvic abscess.   DISCHARGE MEDICATIONS:  1. Pentasa 250 mg three tablets p.o. t.i.d.  2. Reglan 10 mg one tablet p.o. q.a.c.  3. Nexium 40 mg one tablet p.o. q.d.  4. Zofran 8 mg one-half to one tablet p.o. q.6h. p.r.n. nausea.  5. Darvocet-N 100 one to two tablets p.o. q.6h. p.r.n. pain.  6. Lantus 30 units q.h.s.  7. Actos 45 mg one tablet p.o. q.d.  8. Accupril 40 mg one tablet p.o. b.i.d.  9. Toprol XL 50 mg one tablet p.o. q.d.  10.      Hydrochlorothiazide 25 mg one tablet p.o. q.d.  11.      Ritalin 10 mg one tablet p.o. q.d.   HOSPITAL FOLLOWUP:  Will be arranged by patient after discharge.  He is to  call the offices of Dr. Renato Shin and Dr. Verl Blalock in the next two  to three weeks after discharge to arrange for follow-up for his diabetes and  Crohn's disease.   CONDITION ON DISCHARGE:  Improved.   DISPOSITION:  The patient is discharged home with [name]  assistance three times weekly.   STUDIES PERFORMED:  The patient had a repeat CT abdomen and pelvis without  contrast which showed no reaccumulation of the previous intra-abdominal  abscess and mild, but improved induration of the right lateral abdominal  wall muscles.  Small bowel follow through on August 27 showed a functioning  ileostomy with no evidence of  abnormalities seen associating any remaining  small bowel.   BRIEF ADMISSION HISTORY AND PHYSICAL:  The patient is a 45 year old black  gentleman well known to the service who presented to his primary care  physician's office with complaint of abdominal pain, nausea, and vomiting  since his last hospital discharge which had occurred the day prior.  He had  been complaining of pain at the site of his ileostomy which had been placed  February 12, 2002 secondary to abscess at the site of an appendix which was  treated with right colectomy and internal ileostomy.  He was admitted for  further management of his abdominal pain, nausea, and vomiting.   HOSPITAL COURSE:  1. ABDOMINAL PAIN, NAUSEA, AND VOMITING:  The patient was treated with high     dose Zofran initially IV  and a Demerol PCA pump for management of his     pain.  His electrolytes were corrected and he was sustained for a short     period of time with TPN.  Once bowel sounds resumed and no evidence of     active disease was identified (normal CT of abdomen and pelvis, normal     ESR), patient was resumed on clear liquid diet which he tolerated     intermittently.  He was also resumed on his oral Reglan for severe     gastroparesis.  His progression was a long and stubborn course but by the     time of discharge patient was tolerating his full liquid diet prescribed     by his providers in addition to peaches and pork rinds provided by his     friends brought to his room.  He was discharged home with medical     management including Zofran p.r.n. nausea, Demerol p.r.n. pain, and     continued Reglan for his gastroparesis.  2. CROHN'S DISEASE:  No evidence of active disease during this admission.     He was continued on his Pentasa.  [name] was consulted and closely     followed this patient and their assistance was greatly appreciated during     this hospitalization.  Follow-up will be with his gastroenterologist, Dr.      [name], to be arranged by patient.  3. TYPE 2 DIABETES WITH SEVERE GASTROPARESIS:  The patient was managed with     sliding scale insulin and low dose Lantus while he was n.p.o. and his     nutritional intake was in question.  At the time of discharge because of     normal dietary advances, patient had been resumed on his Actos and Lantus     at the pre admission doses.  He will be followed up by Dr. Loanne Drilling for     his diabetes in the next two to three weeks for further medical     adjustment.  He will also continue his Reglan for gastroparesis.  4. HYPERTENSION:  This remained well controlled during patient's     hospitalization.  He was to continue his Accupril and Toprol as     prescribed previously.                                               Gwendolyn Grant, M.D. [facility]    VL/MEDQ  D:  05/08/2002  T:  05/08/2002  Job:  [id number]   cc:   Hilliard Clark A. Loanne Drilling, M.D. [facility]   Loralee Pacas. [name], M.D. [facility]

## 2011-01-21 NOTE — H&P (Signed)
NAME:  [name], [name] NO.:  [id number]   MEDICAL RECORD NO.:  [id number]                   PATIENT TYPE:   LOCATION:                                       FACILITY:   PHYSICIAN:  Marletta Lor, M.D. [facility]      DATE OF BIRTH:   DATE OF ADMISSION:  DATE OF DISCHARGE:                                HISTORY & PHYSICAL   CHIEF COMPLAINT:  Abdominal pain, nausea and vomiting.   HISTORY OF PRESENT ILLNESS:  The patient is a 45 year old African American  male with a long history of Crohn's disease.  He was hospitalized in June of  this year for a ruptured appendix with abscess formation and required a  right hemicolectomy with ileostomy on February 12, 2002.  He was admitted here  for abdominal pain, intractable nausea and vomiting from April 21, 2002,  through May 08, 2002.  Yesterday, he developed recurrent mid abdominal  pain associated with intractable nausea and vomiting.  This has been in  spite of maximum outpatient treatment.  He has a history of diabetes with  gastroparesis and has been on insulin therapy.  Due to his intractable  symptoms, inability to tolerate oral intake, the patient was referred to the  ER for evaluation.  He is now admitted for IV fluids and further evaluation  and treatment.   PAST MEDICAL HISTORY:  The patient has a long history of Crohn's disease  complicated by the ruptured appendix approximately three months ago.  He has  a long history of diabetes with gastroparesis and a history of hypertension.  Please see last admission history and physical of April 21, 2002, for  details.   CURRENT MEDICATIONS:  1. Pentasa 250 mg three tablets t.i.d.  2. Reglan 10 mg t.i.d. a.c.  3. Nexium 40 mg daily.  4. Zofran 4 mg every six hours p.r.n. nausea.  5. Darvocet-N 100 p.r.n. pain.  6. Lantus 38 units at bedtime.  7. Actos 45 mg daily.  8. Accupril 40 mg daily.  9. Toprol XL 50 mg daily.  10.      Hydrochlorothiazide  25 mg daily.  11.      Ritalin 10 mg every morning.   PHYSICAL EXAMINATION:  GENERAL APPEARANCE:  The patient is a well-developed,  thin black male in no acute distress.  VITAL SIGNS:  Stable except for resting tachycardia of 110.  He was  afebrile.  SKIN:  Warm and dry without rash.  HEENT:  Normal pupil responses.  Sclerae are anicteric.  ENT negative.  Mucosal membranes appear well hydrated.  NECK:  Supple, there is no adenopathy.  CHEST:  Clear.  CARDIOVASCULAR:  S1 and S2 normal. There was a resting tachycardia,  otherwise negative.  ABDOMEN:  Examination revealed an ileostomy bag to be in place in his right  mid abdominal area.  A large midline scar noted in the lower abdominal  region.  There is mild tenderness about the ileostomy but no  guarding.  Bowel sounds were diminished but present.  EXTREMITIES:  Negative with no edema.  Peripheral pulses full.   IMPRESSION:  1. Recurrent abdominal pain with intractable nausea and vomiting.  2. Crohn's disease, status post ruptured appendix with pelvic abscess June     of 2003.   ADDITIONAL DIAGNOSES:  1. Diabetes mellitus with gastroparesis.  2. Hypertension.   DISPOSITION:  The patient will be admitted to the hospital, treated  symptomatically.  He will be supported with IV fluids and challenged with a  clear liquid diet.  Blood sugars will be monitored and he will be covered  with a sliding scale regimen of Humalog.                                                 Marletta Lor, M.D. [facility]    PFK/MEDQ  D:  05/11/2002  T:  05/11/2002  Job:  [id number]

## 2011-01-21 NOTE — Cardiovascular Report (Signed)
NAME:  [name], [name]                      ACCOUNT NO.:  [id number]   MEDICAL RECORD NO.:  [id number]                   PATIENT TYPE:  INP   LOCATION:  2921                                 FACILITY:  [place]   PHYSICIAN:  Richard A. Rollene Fare, M.D.          DATE OF BIRTH:  [birth year]   DATE OF PROCEDURE:  04/04/2004  DATE OF DISCHARGE:                              CARDIAC CATHETERIZATION   PROCEDURE:  Retrograde central aortic catheterization, selective coronary  angiography, pre and post-intracoronary nitroglycerin administration, left  ventricular angiogram, RAO/LAO projection, sub-selective left internal  mammary artery, abdominal aortic angiogram mid-stream PA projection, hand  injection.  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  Aggrastat double bolus plus infusion, Triton study drug.  Intravenous Lopressor 10 mg IV, 5000 units heparin prior to arrival to the  lab   BRIEF HISTORY:  [name] is a 45 year old African-American married male  with two children __________ and 11-1/2, nonsmoker with diabetes for 20  years, recently started on insulin. Associated history of hypertension,  hyperlipidemia not on statin therapy.  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  He does have a strong family history of coronary disease.  He has not been  on regular aspirin therapy.  He has been on insulin, Lisinopril, Nexium,  Actos and beta-blocker, Xanax.   He was awakened at 3 a.m. with substernal pressure radiating  to the left  shoulder. This was associated with nausea and emesis x1.  In the early a.m.,  he went to [facility] and was found to have an acute  anterior wall myocardial infarction by EKG and clinical findings, and was  referred to [place].  He was transferred by EMS to the emergency room.   On arrival, he had moderate pain and  ST segment elevation V1-V4 with up to  5 mm of ST segment elevation in V3 and V4.  He was given 4 baby aspirin at  Urgent Care and given another 4 baby aspirin in the ER but he had emesis  following this.  He was given 5000 units of heparin.  Patient was  hemodynamically stable with a blood pressure of 120-130 and sinus rhythm.   Informed consent was obtained to proceed with emergency catheterization and  intervention as appropriate in this setting, both from the patient and his  wife. The patient also agreed to participate in the Triton study of oral  Plavix analog versus Plavix.   DESCRIPTION OF PROCEDURE:  He was brought urgently to the second floor CP  lab room #4.  The right groin  was prepped and draped in the usual manner.  Xylocaine 1% was used for local anesthesia  and the CRFA was entered with a  single anterior puncture using 18 thin-walled needle. A 6 [place] __________  side-arm sheath was inserted without difficulty and diagnostic  catheterization was done with 6 French, 4 cm taper, pre-formed coronary and  pigtail catheters using Omnipaque dye throughout the procedure.   LV IC nitroglycerin was given into the left coronary system during the  diagnostic and interventional procedures. LV angiogram was done with 25 cc,  14 cc/second RAO and 20 cc/12 cc second LAO projection.  Pull-back pressure  of CA showed no gradient across the aortic valve.   Subselective LIMA done with the right coronary catheter showed widely patent  LIMA with no subclavian stenosis.  Abdominal angiogram was done by hand  above the level of the renal arteries,  demonstrating single normal renal  arteries bilaterally.   Fluoroscopy showed 1+ calcification of the circumflex LAD and right coronary  arteries, no intracardiac or valvular calcification.   LV angiogram demonstrated hypo-akinesis from the distal third of the  anterolateral wall to the distal third of the inferior wall with paradoxical  apical bulge. There was hypokinesis from the mid-septum down to the apex.  Estimated EF was approximately 45-55% because of compensatory hyper-  contractility of the posterior basilar inferior walls. There was no  significant mitral regurgitation present.   PRESSURES:  LV:  160/0; LVEDP 18-20; A 20-24 mmHg.   CA:  160/90 mmHg.  Blood pressure came down after IC nitroglycerin  administration.   The main left coronary was long and normal.   The left anterior descending artery gave off a large septal perforator in  its proximal third and a large DX1.  The LAD was not visualized beyond the  DX1 which was presumed to be totally occluded with no antegrade filling, and  no collateralization from the right. The DX1 had a 90-95% ostial and  proximal segmental calcific stenosis and a 90% mid-stenosis, was a  moderately large vessel that bifurcated distally.   The optional diagonal had 50% mid-narrowing, trifurcated distally and was  moderately large.   The circumflex had a 70% narrowing of a thin, first marginal branch to  ostia, no significant stenosis of the small OM2 or the large, tortuous OM3.  The distal circumflex bifurcated into 2 moderately large branches. There was  40% narrowing beyond the OM3.   The right coronary was a dominant vessel. There was 40% irregularity and  narrowing in the proximal third and in the mid-portion before acute margin  after the RV branches.  There was no high grade stenosis. There were 3 PDA branches and a trifurcating POA that had irregularity but no high grade  stenosis. There were no collaterals to the LAD  visualized except for quite  faintly at the apex.   The patient was having ongoing chest pain in the laboratory and it was  elected to proceed with emergency intervention.  He was given double bolus  Aggrastat plus infusion (25 mcg/kg bolus plus infusion). Heparin had been  given 5000 units in the ER. He was given intermittent Lopressor in the lab,  a total of 10 mg IV.  He had emesis during the interventional procedure on  several occasions and he was treated with IV Zofran for a total of 12 mg,  and then given another 12.5 of Phenergan at the end of the procedure. In  general, he tolerated the diagnostic  and interventional procedure well with  no hemodynamic compromise, no significant arrhythmia and relief of pain with  reperfusion.   This was a complex, double wire technique because of the large diagonal.  It  was presumed the LAD was totally occluded just beyond the diagonal, although  we could not see this origin there well.   The LAD was intubated with a 6 [place] JL-4 guiding catheter.  IC  nitroglycerin was administered.  An Asahi soft wire was used to cross the  diagonal and the wire positioned in the distal diagonal. We then used an  Asahi light, 0.014 inch soft tip guide wire to probe the LAD just beyond the  diagonal, and was able to enter the LAD within the lumen, and pass the guide  wire across the stenosis into the distal vessel.  The LAD was then dilated  with a 2.0 balloon at 5-23 for better visualization. The balloon was then  switched to the proximal diagonal wire and this, the ostium and mid-diagonal  were dilated  4-19.   The balloon was upgraded in the diagonal and a 2.5 balloon was used to  dilate the mid-portion at 4-19. The ostium was dilated at 9-46 and then  finally at 12-37.  The mid-diagonal was then stented with a 2.5/12 balloon  deployed at 12-37 and post-dilated at 14-32. The 2.5 balloon was then used  to predilate the ostium at 7-29.  The balloon was  pulled back, there was  recoil and high grade stenosis of the ostium but good flow was present.  We  then elected to stent the LAD across the diagonal ostium, since this was the  most important vessel.  The LAD was stented with a 3.0/13 CYPHER balloon  deployed at  14-31 and post-dilated at 15-39.  Good LAD flow was restored.   The diagonal was patent with high grade ostial stenosis. We had pulled the  diagonal wire back. We then re-crossed the diagonal through the LAD stent  with 0.014 inch Asahi soft wire, and were able to get through the struts.  The proximal diagonal and the strut interstices were opened and dilated with  a 2.5/15 Voyager balloon at 8-42 with good balloon expansion. There was  still elastic recoil and high grade stenosis so we were then able to  exchange and pass a CYPHER 2.5/8 stent through the stent struts to position this covering the ostial lesion, but not protruding into the LAD, and deploy  it at 14-49.   Finally, the balloon was pulled back, IC nitroglycerin was administered and  final angiograms were done in multiple projections.   The LAD stenosis was reduced from 100 to 0% with good stent apposition.  The  ostium of the large DX1 was reduced from 95% to less than 10% with good  final result, and the mid-diagonal lesion was reduced from 90% to 0%.  There  was 40% narrowing beyond this in the diagonal but good flow. There was good  TIMII-3 flow to the DX1 and the distal LAD.   The patient became pain-free in the laboratory.  His final EKG showed near  complete resolution with only 1 mm ST elevation in V1-V2 post-procedure,  poor R wave progression in V1-V4. He maintained sinus rhythm and hemodynamic  stability.   The patient will be continued to study drug for antiplatelet effect and  Aggrastat IIb/IIIa inhibitor for 18 hours along with ancillary therapy  including ACE inhibitor, beta-blockers, aspirin and treatment of his  diabetes,  addition of statins  and aspirin to his regimen.   Careful follow-up of his renal function will also be necessary and titration  of his medications in-hospital.   CATHETERIZATION DIAGNOSES:  1. Ongoing acute anterior wall myocardial infarction, ST segment elevation     myocardial infarction.  2. 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  3. A 95% ostial and proximal and 90% mid-stenosis, large diagonal 1 side-     branch, treated with percutaneous transluminal angioplasty and stenting     as outlined above.  4. Anteroapical-inferoapical-hypo-akinesis, compensatory ejection fraction     45-50%.  No mitral regurgitation.  5. Normal renal function preoperative.  6. A 20 years' history of diabetes, currently insulin dependent.  7. Hyperlipidemia.                                               Richard A. Rollene Fare, M.D.    RAW/MEDQ  D:  04/04/2004  T:  04/04/2004  Job:  [id number]   cc:   Darletta Moll. Bubba Hales, M.D.  [address]  [place]  [place] [address]  Fax: [phone]   [facility]

## 2011-01-21 NOTE — H&P (Signed)
[facility]  Patient:    [name], [name] Visit Number: [id number] MRN: [id number]          Service Type: MED Location: [id number] Attending Physician:  Vincent Peyer Dictated by:   Alfredia Ferguson, P.A. Admit Date:  01/15/2002 Discharge Date: 01/16/2002                           History and Physical  CHIEF COMPLAINT:  Nausea, vomiting, and left abdominal pain.  HISTORY OF PRESENT ILLNESS:  [name] is a 45 year old African-American male known to [employer]. [name], M.D., and also to Gloris Ham, M.D.  He has a history of insulin-dependent diabetes mellitus, hypertension, hyperlipidemia, prior right inguinal hernia repair.  The patient had undergone workup last spring for complaints of hematochezia and intermittent epigastric pain, had undergone EGD which was unremarkable, upper abdominal ultrasound also negative, small-bowel follow-through which was normal, and then had sigmoidoscopy in April 2002 with finding of abnormal rectal mucosa concerning for possible rectal tumor.  This was followed by a colonoscopy in June 2002, which then showed multiple rectal polyps which appeared to be inflammatory and extended into the anal canal.  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 There were no adenomatous or malignant changes noted.  The patient also had had ID markers sent because of the atypical findings, and these returned positive with a positive IgA _____ consistent with Crohns.  His pathology was reviewed, and Dr. [name] felt that he probably had rectal Crohns and was started on Pentasa 750 mg t.i.d. and Canasa suppositories in September 2002.  We have not seen the patient since that time, and he has not followed with Dr. Loanne Drilling since that time per our records.  The patient at this time presents with onset two weeks ago with nausea, which has been  intermittent and lasting up to several hours at a time.  Last Friday on Jan 11, 2002, he states that he was nauseated all night but never vomited, felt okay Saturday, and then became nauseated again Saturday evening without abdominal pain or vomiting.  Sunday he did well and then today, Tuesday, May 13, began about 7:45 this morning with recurrent nausea which then became progressive, associated with vomiting.  Initially he says he felt better after he vomited, but then the nausea recurred.  He came to the emergency room for evaluation, had not had any fever, chills, diarrhea, melena, etc., has no complaints of dysuria, cough, shortness of breath, etc.  He says that he did vomit a few streaks of blood.  He was seen and evaluated in the ER.  Labs were done, which were negative with the exception of the glucose of 291.  The patient was attempting clear liquids before being discharged to home when he had recurrent nausea and then developed fairly severe left-sided abdominal pain.  He has been given Phenergan and morphine and is comfortable currently. GI was called, and the patient is being admitted at this time for further diagnostic evaluation.  Labs in the ER showed WBC of 8.2, hemoglobin 14.4, hematocrit of 42.5, MCV of 87.  UA was negative.  Sodium 138, potassium 3.6, CO2 of 26, glucose 291, creatinine 0.7.  Liver function studies normal.  Amylase 37, lipase of 13.  MEDICATIONS:  Celebrex on a p.r.n. basis, Ritalin 10 mg q.d., Provigil 200 mg q.d., Lipitor 80 mg q.d., Toprol XL 50 mg  q.d., Nexium 40 mg q.d., Pentasa 250 mg generally six per day.  He is also on insulin Humalog 30 units q.a.m., 60 at lunch, and 45 units q.p.m., and Humalog N 30 units q.p.m.  ALLERGIES:  No known drug allergies.  PAST MEDICAL HISTORY:  As outlined above.  The patient also has a diagnosis of peripheral neuropathy and narcolepsy.  FAMILY HISTORY:  Pertinent for mother with colon polyps.  SOCIAL HISTORY:   The patient is married.  He is employed.  He has a part-time third-shift job.  No tobacco and no ETOH.  PHYSICAL EXAMINATION:  VITAL SIGNS:  Temperature is 97.8, blood pressure 149/81, pulse in the 80s.  GENERAL:  A well-developed African-American male in no acute distress.  HEENT:  Nontraumatic, normocephalic.  EOMI, PERRLA.  Sclerae are anicteric.  CHEST:  Clear to A&P.  CARDIAC:  Regular rate and rhythm with S1 and S2.  ABDOMEN:  Soft.  Bowel sounds are active.  He is tender in the left upper quadrant, left midquadrant, and left lower quadrant.  There is no guarding or rebound, no mass or hepatosplenomegaly.  RECTAL:  Scant stool which is heme-negative, and no mass felt in the rectum.  EXTREMITIES:  Without clubbing, cyanosis, or edema.  IMPRESSION: 46. A 45 year old African-American male with a two-week history of intermittent    nausea and vomiting and now progressive with nausea, vomiting, and    left-sided abdominal pain.  Rule out partial obstruction, i.e., progression    of Crohns colitis or enteritis.  Rule out viral syndrome.  Rule out    ureterolithiasis.  Rule out gastroparesis, though feel less likely with    acute pain.  Rule out nonsteroidal anti-inflammatory drug-induced peptic    ulcer disease. 2. Insulin-dependent diabetes mellitus. 3. Hypertension. 4. Hyperlipidemia. 5. History of gastroesophageal reflux disease. 6. Narcolepsy. 7. Status post right inguinal hernia repair.  PLAN:  Patient is admitted to the service of Dr. Delfin Edis for IV fluid hydration, sliding scale insulin coverage, bowel rest, CT scan of the abdomen and pelvis.  If this is negative, consider EGD.  Pain control and antiemetics as needed.  Will check the sedimentation rate and hemoglobin a1C in addition to admitting labs and plan internal medicine consult in a.m. if patient needs continued hospitalization. Dictated by:   Alfredia Ferguson, P.A. Attending Physician:  Vincent Peyer DD:  01/15/02 TD:  01/17/02 Job: [id number]

## 2011-01-21 NOTE — Discharge Summary (Signed)
   [name], [name]                        ACCOUNT NO.:  [id number]   MEDICAL RECORD NO.:  [id number]                   PATIENT TYPE:  INP   LOCATION:  0370                                 FACILITY:  [facility]   PHYSICIAN:  Sean A. Loanne Drilling, M.D. [facility]           DATE OF BIRTH:  11-14-1965   DATE OF ADMISSION:  04/20/2002  DATE OF DISCHARGE:  04/25/2002                                 DISCHARGE SUMMARY   REASON FOR ADMISSION:  Vomiting.   HISTORY OF PRESENT ILLNESS:  The patient is a 45 year old man with a history  of several recent abdominal surgeries and intra-abdominal infection, as well  as diabetic gastroparesis.  He is admitted with intractable vomiting.  Please refer to Dr. Clayborn [name] history and physical for details.   HOSPITAL COURSE:  The patient was admitted, and his medications were  minimized.  He was also given symptomatic therapy for his nausea.  His  nausea dramatically improved on Reglan 10 mg q.i.d.  He was also instructed  by our dietitian regarding a low residue diet.  He requested medication for  insomnia and this was prescribed at the time of discharge.   Regarding his diabetes, he is felt not to be a candidate for intensive  insulin therapy, so he has therefore been on once daily insulin.  This is  documented in the outpatient chart.  He required no insulin for much of his  hospitalization, but when he felt better and began eating again, he was  prescribed 15 units of Lantus once a day.   On 04/25/02, the patient is alert and oriented, ambulatory, and eating a low  residue diet, and is thus discharged home.   DISCHARGE DIAGNOSES:  1. Type 2 diabetes.  2. Diabetic gastroparesis.  3. Intractable vomiting secondary to #2.  4. Other chronic medical problems as noted in the history and physical.  5. Insomnia.   DISCHARGE MEDICATIONS:  1. Lantus 15 units q.d.  2. Accupril 40 mg b.i.d.  3. Reglan 10 mg q.i.d. (q.a.c. and q.h.s.).  4. Ambien 10 mg q.h.s. p.r.n.  sleep.   DIET:  Low residue diabetic.   ACTIVITY:  No restriction.    DISCHARGE INSTRUCTIONS:  Do not take the following medications:  Toprol,  Lipitor, Provigil, Nexium, Pentasa, and Ritalin.   FOLLOWUP:  With me, Dr. Loanne Drilling, in the office in two weeks.                                                Sean A. Loanne Drilling, M.D. [facility]    SAE/MEDQ  D:  04/25/2002  T:  04/25/2002  Job:  [id number]

## 2011-01-21 NOTE — H&P (Signed)
NAMEDREWEY, BEGUE                        ACCOUNT NO.:  [id number]   MEDICAL RECORD NO.:  [id number]                   PATIENT TYPE:  INP   LOCATION:  7035                                 FACILITY:  [facility]   PHYSICIAN:  Sean A. Loanne Drilling, M.D.               DATE OF BIRTH:  1966-05-23   DATE OF ADMISSION:  04/02/2002  DATE OF DISCHARGE:                                HISTORY & PHYSICAL   REASON FOR ADMISSION:  Fever.   HISTORY OF PRESENT ILLNESS:  The patient is a 45 year old man recently  hospitalized after bowel resection with intractable nausea, felt due to  diabetic gastroparesis.  The patient states there has been no improvement in  the last few months of moderate abdominal pain, localized to the epigastric  area.  It happens within or outside of the context of eating,  interchangeably.  There is associated nausea and vomiting, if he does not  take his Zofran.   PAST MEDICAL HISTORY:  1. Type 2 diabetes.  2. ADHD.  3. Dyslipidemia.  4. Hypertension.  5. Diabetic nephropathy.  6. Noncardiogenic chest pain.  7. Crohn's disease.   MEDICATIONS:  1. Ritalin 10 mg q.d.  2. Actos 45 mg q.d.  3. Lipitor 80 mg q.d.  4. Accupril 40 mg b.i.d.  5. Toprol XL 50 mg q.d.  6. Lantus 30 units q.h.s.  7. Nexium 40 mg q.d.  8. Pentasa 750 mg t.i.d.  9. Provigil 200 mg q.d.  10.      Vicodin one q.6h. p.r.n. pain.  11.      Levsin/SL 0.125 mg q.6h. p.r.n. abdominal cramps.  12.      Zofran 2 mg one or two q.6h. p.r.n. nausea.   SOCIAL HISTORY:  The patient is married; his wife is with him.  He is  disabled due to his recent illness.   FAMILY HISTORY:  No one else at home is ill.   REVIEW OF SYSTEMS:  Denies the following:  headaches, sore throat, syncope,  cough, diarrhea, heartburn, GI bleeding, hematuria, dysuria and skin rash.   PHYSICAL EXAMINATION:   VITAL SIGNS:  Blood pressure 120/88, heart rate 130, temperature 101.9,  respiratory rate 20, weight 154.   GENERAL:   Appears slightly ill.   SKIN:  Not diaphoretic.   LYMPHATICS:  Nodes with no palpable lymphadenopathy.   HEENT:  Head is atraumatic.  Sclerae not icteric.  Pharynx clear.  Tympanic  membranes with no erythema.   NECK:  Supple.   CHEST:  Clear to auscultation.   CARDIOVASCULAR:  No JVD.  No edema.  Tachycardic with regular rhythm; no  murmur.  Pedal pulses are intact.   ABDOMEN:  There is a colostomy bag in place.  There is a healing anterior  abdominal wound, with a packing in it.  The abdomen is soft, it is  nontender; there is no hepatosplenomegaly and no mass.  No blood is observed  in the colostomy bag.   EXTREMITIES:  No obvious deformities.   NEUROLOGIC:  Alert, well oriented.  The patient is ambulatory with a cane.  He moves all four as well.   IMPRESSION:  1. Fever of uncertain etiology.  2. Intractable vomiting, probably due to a combination of #1 and his     gastroparesis.  3. Chronic abdominal pain.  It is uncertain if this is any worse than usual.  4. Other chronic medical problems as noted above.   PLAN:  1. Because of the risk of serious underlying infection, I have advised     hospital admission; the patient agrees.  2. Symptomatic therapy.  3. Cultures.  4. Antibiotics.  5. Chest x-ray.  6. CT scan of the abdomen.                                               Sean A. Loanne Drilling, M.D.    SAE/MEDQ  D:  04/02/2002  T:  04/03/2002  Job:  [id number]

## 2011-01-21 NOTE — Discharge Summary (Signed)
[facility]  Patient:    [name], [name] Visit Number: [id number] MRN: [id number]          Service Type: MED Location: [phone] Attending Physician:  Gloris Ham Dictated by:   Orson Ape. Rise Patience, M.D. Admit Date:  03/14/2002 Disc. Date: 03/12/02   CC:         Gloris Ham, M.D. [facility]   Discharge Summary  DISCHARGE DIAGNOSES: 1. Intra-abdominal abscess secondary to previous appendicitis and Crohns    disease. 2. Diabetes mellitus. 3. History of Crohns disease. 4. History of recent right colectomy with terminal ileostomy and previous    drainage of intra-abdominal abscess.  OPERATIONS:  Percutaneous drainage of recurrent intra-abdominal abscess done in radiology.  HISTORY OF PRESENT ILLNESS AND HOSPITAL COURSE:  The patient is a 45 year old black male long history of Crohns disease and intermittent abdominal pain for a month or so who had increasing pain and was admitted by Dr. Deon Pilling on February 12, 2002 and taken to surgery where he had a significant abscess requiring a right colectomy, a terminal ileostomy and a closure of the transverse colon by Dr. Deon Pilling.  He did satisfactory immediately afterwards but developed an abscess in the area where the cecum had previously been and this was drained radiologically with the drain being removed approximately 10 days ago.  They did a repeat CT.  They did not do a fistulous tract injection and the patient was followed in the office, had increasing abdominal pain, low-grade fever, elevated white count and was seen in the office on the morning of March 07, 2002 by Dr. Deon Pilling.  White count obtained and I was called being the physician on-call and obtained a CT of the abdomen which showed a reaccumulation of this abscess that had been drained 10 days earlier. Percutaneously we drained the abscess with a pigtail drain by Dr. Loletta Parish and the patient was admitted on March 07, 2002 for IV  antibiotics.  The patient was admitted over the July 4th weekend.  The cultures were a little slow at coming back but it showed that the cultures grew a microaerophilic strep plus an Escherichia coli and the sensitivities it is resistant to - ampicillin, intermediate to Unasyn which he is on but more sensitive to Cipro and I switched him to oral Cipro yesterday, March 11, 2002 which he is tolerating with mild nausea but he is tolerating the oral Cipro.  A repeat CT was obtained yesterday which showed no evidence of a persistent abscess.  We see no evidence of a fistula.  The drainage of course smelled feculent initially and I think it best to discharge him with the Jackson-Pratt drain or the Jackson-Pratt reservoir with a pigtail drain and then follow up in the office after approximately 1 week of antibiotics.  The patient states that Dr. Deon Pilling is planning to wait approximately 2 months before take-down of the ileostomy and reestablish the bowel continuity and would discharge in the morning on his chronic medications which are as follows.  He is on Accupril 40 mg daily, Toprol XL 150 daily, Nexium, Lantus 100 units at night, Reglan 10 mg b.i.d. for gastroparesis, Actos 45 mg daily, Pentasa 750 three times a day and hydrochlorothiazide 12.5 mg daily.  His initial wound from surgery approximately 3 weeks ago is granulating, was left open, his ileostomy is working nicely, his abdomen is now soft on exam and his white count which was 15,000 when he was first admitted on March 07, 2002 was 5.3 on March 10, 2002.  His hematocrit is 27 and his glucose has been running on a slightly low level but he says he thinks it is just because he is not eating as well here, he does not like the hospital food, and we will let him adjust the diet and insulin management as he normally does.  His BUN and creatinine were normal when he was readmitted and he is discharged in improved condition.  He has got a little  Jackson-Pratt reservoir that he will empty twice daily and record the drainage and he will be seen by Dr. Deon Pilling or myself for followup in approximately 5 or 6 days and we will determine whether the drain can be removed or whether to do a sinus tract or fistulous abscess gram prior to removing the drain at this time. Dictated by:   Orson Ape. Rise Patience, M.D. Attending Physician:  Gloris Ham DD:  03/12/02 TD:  03/14/02 Job: [id number]

## 2011-02-03 ENCOUNTER — Encounter: Payer: Self-pay | Admitting: Internal Medicine

## 2011-02-15 ENCOUNTER — Encounter: Payer: Self-pay | Admitting: Internal Medicine

## 2011-02-18 ENCOUNTER — Encounter: Payer: Self-pay | Admitting: Internal Medicine

## 2011-03-31 ENCOUNTER — Encounter: Payer: Self-pay | Admitting: Internal Medicine

## 2011-04-08 ENCOUNTER — Encounter: Payer: Self-pay | Admitting: Internal Medicine

## 2011-04-08 ENCOUNTER — Ambulatory Visit (INDEPENDENT_AMBULATORY_CARE_PROVIDER_SITE_OTHER): Payer: BC Managed Care – PPO | Admitting: Internal Medicine

## 2011-04-08 DIAGNOSIS — I2589 Other forms of chronic ischemic heart disease: Secondary | ICD-10-CM

## 2011-04-08 DIAGNOSIS — I82409 Acute embolism and thrombosis of unspecified deep veins of unspecified lower extremity: Secondary | ICD-10-CM

## 2011-04-08 DIAGNOSIS — I10 Essential (primary) hypertension: Secondary | ICD-10-CM

## 2011-04-08 DIAGNOSIS — I2510 Atherosclerotic heart disease of native coronary artery without angina pectoris: Secondary | ICD-10-CM | POA: Insufficient documentation

## 2011-04-08 DIAGNOSIS — I255 Ischemic cardiomyopathy: Secondary | ICD-10-CM

## 2011-04-08 DIAGNOSIS — Z95810 Presence of automatic (implantable) cardiac defibrillator: Secondary | ICD-10-CM

## 2011-04-08 DIAGNOSIS — I428 Other cardiomyopathies: Secondary | ICD-10-CM

## 2011-04-08 DIAGNOSIS — G4710 Hypersomnia, unspecified: Secondary | ICD-10-CM

## 2011-04-08 NOTE — Assessment & Plan Note (Signed)
I recommended a sleep study. This may have a beneficial impact on his heart as well as blood pressure

## 2011-04-08 NOTE — Assessment & Plan Note (Signed)
The patient's device was interrogated.  The information was reviewed. No changes were made in the programming.    

## 2011-04-08 NOTE — Assessment & Plan Note (Signed)
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

## 2011-04-08 NOTE — Assessment & Plan Note (Signed)
Poorly controlled hypertension. I have recommended that he get serial blood pressure measurements at his local pharmacy and follow up with his primary care physician regarding this. I stressed the importance of long-term management of his hypertension both in relation to his heart as well as renal function

## 2011-04-08 NOTE — Assessment & Plan Note (Signed)
We'll continue current medications except discontinue the aspirin as it likely to benefit

## 2011-04-08 NOTE — Patient Instructions (Addendum)
Remote monitoring is used to monitor your Pacemaker of ICD from home. This monitoring reduces the number of office visits required to check your device to one time per year. It allows [place] to keep an eye on the functioning of your device to ensure it is working properly. You are scheduled for a device check from home on 07/07/11. You may send your transmission at any time that day. If you have a wireless device, the transmission will be sent automatically. After your physician reviews your transmission, you will receive a postcard with your next transmission date.  Your physician wants you to follow-up in: 1 year with Dr. Caryl Comes. You will receive a reminder letter in the mail two months in advance. If you don't receive a letter, please call our office to schedule the follow-up appointment.  Your physician recommends that you continue on your current medications as directed. Please refer to the Current Medication list given to you today.

## 2011-04-08 NOTE — Assessment & Plan Note (Signed)
Patient's functional status is stable. Will continue on his current medications.

## 2011-04-08 NOTE — Progress Notes (Signed)
HPI: [name] is a 45 y.o. male Seen to establish ICD followup. This was implanted 2010 for primary prevention. He was done in the setting of prior MI prior stenting ischemic cardiomyopathy with an ejection fraction of 15-20% and patent stents  He has had no ICD discharges.  He has chronic intermittent chest pains. These are no different over the last couple of years. His functional status is class II.  He has daytime somnolence and some sleep-disordered breathing. 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  He takes warfarin because he has a history of recurrent lower extremity venous thrombosis. No  workup was undertaken for genetic evaluation.   Current Outpatient Prescriptions  Medication Sig Dispense Refill  . aspirin (ASPIR-81) 81 MG EC tablet Take 81 mg by mouth daily.        [name] atorvastatin (LIPITOR) 80 MG tablet Take 80 mg by mouth daily.        . carvedilol (COREG) 25 MG tablet Take 25 mg by mouth 2 (two) times daily with a meal.        . esomeprazole (NEXIUM) 40 MG capsule Take 40 mg by mouth daily.        . furosemide (LASIX) 20 MG tablet Take 3 tabs twice a day       . isosorbide mononitrate (IMDUR) 60 MG 24 hr tablet Take 60 mg by mouth daily.        [name] lisinopril (PRINIVIL,ZESTRIL) 20 MG tablet Take 40 mg by mouth daily.       . potassium chloride (KLOR-CON) 10 MEQ CR tablet Take 10 mEq by mouth daily.        [name] warfarin (COUMADIN) 7.5 MG tablet Take 7.5 mg by mouth daily.          Allergies no known allergies  Past Medical History  Diagnosis Date  . Coronary artery disease      Widely patent previously placed stents in the left anterior   . Type II or unspecified type diabetes mellitus without mention of complication, not stated as uncontrolled   . Ischemic cardiomyopathy     Ejection fraction 15-20% catheterization 2010  . Hypertension, essential   . Automatic implantable cardiac defibrillator in situ     st Judes  .  Benign neoplasm of colon   . Unspecified gastritis and gastroduodenitis without mention of hemorrhage   . 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 Hypersomnolent     Previous diagnosis of narcolepsy    Past Surgical History  Procedure Date  . Colon surgery   . Appendectomy   . Fetal surgery for congenital hernia     No family history on file.  History   Social History  . Marital Status: Married    Spouse Name: N/A    Number of Children: N/A  . Years of Education: N/A   Occupational History  . Not on file.   Social History Main Topics  . Smoking status: Never Smoker   . Smokeless tobacco: Not on file  . Alcohol Use: Not on file  . Drug Use: Not on file  . Sexually Active: Not on file   Other Topics Concern  . Not on file   Social History Narrative  . No narrative on file    Fourteen point review of systems was negative except as noted in HPI and PMH   PHYSICAL EXAMINATION  Blood pressure 169/83, pulse 65, height 5' 8"  (1.727 m),  weight 213 lb (96.616 kg).   Well developed and nourished middle-aged [place] American male appearing his stated age in no acute distress HENT normal Neck supple with JVP-flat Carotids brisk and full without bruits Back without scoliosis or kyphosis Clear Regular rate and rhythm, displaced PMI; no S3, 2/6 systolic murmur Abd-soft with active BS without hepatomegaly or midline pulsation Femoral pulses 2+ distal pulses intact No Clubbing cyanosis edema Skin-warm and dry LN-neg submandibular and supraclavicular A & Oriented CN 3-12 normal  Grossly normal sensory and motor function Affect engaging .   \

## 2011-06-01 LAB — DIFFERENTIAL
Basophils Absolute: 0
Basophils Relative: 0
Eosinophils Absolute: 0.1
Eosinophils Relative: 1
Lymphocytes Relative: 16
Lymphs Abs: 1.8
Monocytes Absolute: 0.3
Monocytes Relative: 3
Neutro Abs: 9.2 — ABNORMAL HIGH
Neutrophils Relative %: 80 — ABNORMAL HIGH

## 2011-06-01 LAB — COMPREHENSIVE METABOLIC PANEL WITH GFR
ALT: 17
AST: 20
Albumin: 3.9
Alkaline Phosphatase: 117
Calcium: 9.4
GFR calc Af Amer: >60
Glucose, Bld: 359 — ABNORMAL HIGH
Potassium: 4
Sodium: 137
Total Protein: 8.2

## 2011-06-01 LAB — CBC
HCT: 46.4
Hemoglobin: 15.5
MCHC: 33.5
MCV: 83.1
Platelets: 189
RBC: 5.58
RDW: 14
WBC: 11.5 — ABNORMAL HIGH

## 2011-06-01 LAB — APTT: aPTT: 25

## 2011-06-01 LAB — COMPREHENSIVE METABOLIC PANEL
BUN: 8
CO2: 27
Chloride: 98
Creatinine, Ser: 0.92
GFR calc non Af Amer: >60
Total Bilirubin: 1

## 2011-06-01 LAB — PROTIME-INR
INR: 0.9
Prothrombin Time: 12.6

## 2011-06-02 LAB — BASIC METABOLIC PANEL
CO2: 28
Calcium: 8 — ABNORMAL LOW
Calcium: 8.2 — ABNORMAL LOW
Chloride: 106
Chloride: 107
GFR calc Af Amer: >60
GFR calc non Af Amer: >60
GFR calc non Af Amer: >60
Glucose, Bld: 128 — ABNORMAL HIGH
Potassium: 3.8
Potassium: 3.8
Sodium: 139
Sodium: 141
Sodium: 142

## 2011-06-02 LAB — DIFFERENTIAL
Basophils Absolute: 0
Basophils Relative: 0
Eosinophils Relative: 1
Lymphocytes Relative: 14
Lymphocytes Relative: 14
Lymphs Abs: 1.7
Monocytes Absolute: 0.2
Neutro Abs: 8.2 — ABNORMAL HIGH
Neutro Abs: 9.6 — ABNORMAL HIGH
Neutrophils Relative %: 78 — ABNORMAL HIGH

## 2011-06-02 LAB — URINALYSIS, ROUTINE W REFLEX MICROSCOPIC
Bilirubin Urine: NEGATIVE
Glucose, UA: >1000 — AB
Glucose, UA: NEGATIVE
Ketones, ur: 40 — AB
Leukocytes, UA: NEGATIVE
Nitrite: NEGATIVE
Protein, ur: 100 — AB
Specific Gravity, Urine: 1.008
Urobilinogen, UA: 0.2
pH: 7.5

## 2011-06-02 LAB — URINE MICROSCOPIC-ADD ON

## 2011-06-02 LAB — BLOOD GAS, ARTERIAL
Acid-base deficit: 0.8
FIO2: 0.28
pCO2 arterial: 47.5 — ABNORMAL HIGH
pO2, Arterial: 118 — ABNORMAL HIGH

## 2011-06-02 LAB — CBC
HCT: 38 — ABNORMAL LOW
HCT: 41.9
HCT: 43.2
Hemoglobin: 12.8 — ABNORMAL LOW
Hemoglobin: 14.6
MCHC: 33.3
MCV: 82.3
MCV: 82.7
Platelets: 260
Platelets: 278
Platelets: 325
RBC: 4.78
RBC: 5.25
WBC: 12.3 — ABNORMAL HIGH
WBC: 13.2 — ABNORMAL HIGH
WBC: 7.9
WBC: 9.9

## 2011-06-02 LAB — COMPREHENSIVE METABOLIC PANEL
ALT: 13
AST: 16
Albumin: 2.6 — ABNORMAL LOW
Albumin: 3.2 — ABNORMAL LOW
Alkaline Phosphatase: 96
BUN: 3 — ABNORMAL LOW
CO2: 26
CO2: 26
Calcium: 8.1 — ABNORMAL LOW
Chloride: 102
Chloride: 105
Creatinine, Ser: 0.73
GFR calc Af Amer: >60
GFR calc non Af Amer: >60
GFR calc non Af Amer: >60
Potassium: 2.9 — ABNORMAL LOW
Sodium: 141
Total Bilirubin: 0.9

## 2011-06-02 LAB — PROTIME-INR
INR: 1.4
INR: 2 — ABNORMAL HIGH
INR: 3.2 — ABNORMAL HIGH
Prothrombin Time: 17.8 — ABNORMAL HIGH
Prothrombin Time: 33.7 — ABNORMAL HIGH

## 2011-06-02 LAB — POCT I-STAT, CHEM 8
HCT: 44
Hemoglobin: 15
Potassium: 3.1 — ABNORMAL LOW
Sodium: 135
TCO2: 25

## 2011-06-02 LAB — LIPASE, BLOOD: Lipase: 15

## 2011-06-02 LAB — APTT: aPTT: 70 — ABNORMAL HIGH

## 2011-06-02 LAB — KETONES, QUALITATIVE

## 2011-06-02 LAB — HEMOGLOBIN A1C: Hgb A1c MFr Bld: 12.8 — ABNORMAL HIGH

## 2011-07-07 ENCOUNTER — Ambulatory Visit (INDEPENDENT_AMBULATORY_CARE_PROVIDER_SITE_OTHER): Payer: BC Managed Care – PPO | Admitting: *Deleted

## 2011-07-07 DIAGNOSIS — I2589 Other forms of chronic ischemic heart disease: Secondary | ICD-10-CM

## 2011-07-07 DIAGNOSIS — Z95810 Presence of automatic (implantable) cardiac defibrillator: Secondary | ICD-10-CM

## 2011-07-08 ENCOUNTER — Encounter: Payer: Self-pay | Admitting: Internal Medicine

## 2011-07-14 ENCOUNTER — Encounter: Payer: Self-pay | Admitting: *Deleted

## 2011-07-22 NOTE — Progress Notes (Signed)
icd remote check

## 2011-10-06 ENCOUNTER — Ambulatory Visit (INDEPENDENT_AMBULATORY_CARE_PROVIDER_SITE_OTHER): Payer: BC Managed Care – PPO | Admitting: *Deleted

## 2011-10-06 DIAGNOSIS — Z95810 Presence of automatic (implantable) cardiac defibrillator: Secondary | ICD-10-CM

## 2011-10-06 DIAGNOSIS — I428 Other cardiomyopathies: Secondary | ICD-10-CM

## 2011-10-07 ENCOUNTER — Encounter: Payer: Self-pay | Admitting: Internal Medicine

## 2011-10-07 LAB — REMOTE ICD DEVICE
AL AMPLITUDE: 5 mv
ATRIAL PACING ICD: 1 pct
BATTERY VOLTAGE: 3.08 V
DEV-0020ICD: NEGATIVE
DEVICE MODEL ICD: 474683
HV IMPEDENCE: 46 Ohm
RV LEAD AMPLITUDE: 12 mv
TZAT-0001FASTVT: 1
TZAT-0013FASTVT: 2
TZAT-0020FASTVT: 1 ms
TZON-0003SLOWVT: 400 ms
TZON-0005FASTVT: 6
TZON-0005SLOWVT: 6
TZON-0010FASTVT: 80 ms
TZST-0001FASTVT: 2
TZST-0001FASTVT: 5
TZST-0003FASTVT: 36 J
TZST-0003FASTVT: 36 J
VENTRICULAR PACING ICD: 1 pct

## 2011-10-12 NOTE — Progress Notes (Signed)
Remote defib check

## 2011-10-25 ENCOUNTER — Encounter: Payer: Self-pay | Admitting: *Deleted

## 2011-11-17 ENCOUNTER — Inpatient Hospital Stay (HOSPITAL_COMMUNITY)
Admission: EM | Admit: 2011-11-17 | Discharge: 2011-11-18 | DRG: 127 | Disposition: A | Discharge status: Home / Self Care | Payer: BC Managed Care – PPO | Source: Ambulatory Visit | Attending: Internal Medicine | Admitting: Internal Medicine

## 2011-11-17 ENCOUNTER — Emergency Department (HOSPITAL_COMMUNITY): Payer: BC Managed Care – PPO

## 2011-11-17 ENCOUNTER — Other Ambulatory Visit: Payer: Self-pay

## 2011-11-17 ENCOUNTER — Encounter (HOSPITAL_COMMUNITY): Payer: Self-pay | Admitting: Nurse Practitioner

## 2011-11-17 DIAGNOSIS — I252 Old myocardial infarction: Secondary | ICD-10-CM

## 2011-11-17 DIAGNOSIS — Z95810 Presence of automatic (implantable) cardiac defibrillator: Secondary | ICD-10-CM | POA: Diagnosis present

## 2011-11-17 DIAGNOSIS — Z7982 Long term (current) use of aspirin: Secondary | ICD-10-CM

## 2011-11-17 DIAGNOSIS — I2589 Other forms of chronic ischemic heart disease: Secondary | ICD-10-CM | POA: Diagnosis present

## 2011-11-17 DIAGNOSIS — Z7901 Long term (current) use of anticoagulants: Secondary | ICD-10-CM

## 2011-11-17 DIAGNOSIS — K219 Gastro-esophageal reflux disease without esophagitis: Secondary | ICD-10-CM | POA: Diagnosis present

## 2011-11-17 DIAGNOSIS — R739 Hyperglycemia, unspecified: Secondary | ICD-10-CM

## 2011-11-17 DIAGNOSIS — E7800 Pure hypercholesterolemia, unspecified: Secondary | ICD-10-CM | POA: Diagnosis present

## 2011-11-17 DIAGNOSIS — E1143 Type 2 diabetes mellitus with diabetic autonomic (poly)neuropathy: Secondary | ICD-10-CM

## 2011-11-17 DIAGNOSIS — E1149 Type 2 diabetes mellitus with other diabetic neurological complication: Secondary | ICD-10-CM | POA: Diagnosis present

## 2011-11-17 DIAGNOSIS — G47419 Narcolepsy without cataplexy: Secondary | ICD-10-CM | POA: Diagnosis present

## 2011-11-17 DIAGNOSIS — R112 Nausea with vomiting, unspecified: Secondary | ICD-10-CM | POA: Diagnosis present

## 2011-11-17 DIAGNOSIS — I2510 Atherosclerotic heart disease of native coronary artery without angina pectoris: Secondary | ICD-10-CM | POA: Diagnosis present

## 2011-11-17 DIAGNOSIS — I255 Ischemic cardiomyopathy: Secondary | ICD-10-CM | POA: Diagnosis present

## 2011-11-17 DIAGNOSIS — Z86711 Personal history of pulmonary embolism: Secondary | ICD-10-CM

## 2011-11-17 DIAGNOSIS — I10 Essential (primary) hypertension: Secondary | ICD-10-CM | POA: Diagnosis present

## 2011-11-17 DIAGNOSIS — Z86718 Personal history of other venous thrombosis and embolism: Secondary | ICD-10-CM

## 2011-11-17 DIAGNOSIS — I5023 Acute on chronic systolic (congestive) heart failure: Principal | ICD-10-CM | POA: Diagnosis present

## 2011-11-17 DIAGNOSIS — I509 Heart failure, unspecified: Secondary | ICD-10-CM | POA: Diagnosis present

## 2011-11-17 DIAGNOSIS — Z79899 Other long term (current) drug therapy: Secondary | ICD-10-CM

## 2011-11-17 DIAGNOSIS — E876 Hypokalemia: Secondary | ICD-10-CM | POA: Diagnosis present

## 2011-11-17 DIAGNOSIS — K5090 Crohn's disease, unspecified, without complications: Secondary | ICD-10-CM | POA: Diagnosis present

## 2011-11-17 DIAGNOSIS — E785 Hyperlipidemia, unspecified: Secondary | ICD-10-CM | POA: Diagnosis present

## 2011-11-17 DIAGNOSIS — K3184 Gastroparesis: Secondary | ICD-10-CM | POA: Diagnosis present

## 2011-11-17 DIAGNOSIS — Z794 Long term (current) use of insulin: Secondary | ICD-10-CM

## 2011-11-17 DIAGNOSIS — E119 Type 2 diabetes mellitus without complications: Secondary | ICD-10-CM | POA: Diagnosis present

## 2011-11-17 DIAGNOSIS — I82409 Acute embolism and thrombosis of unspecified deep veins of unspecified lower extremity: Secondary | ICD-10-CM | POA: Diagnosis present

## 2011-11-17 HISTORY — DX: Angina pectoris, unspecified: I20.9

## 2011-11-17 HISTORY — DX: Gastro-esophageal reflux disease without esophagitis: K21.9

## 2011-11-17 HISTORY — DX: Pure hypercholesterolemia, unspecified: E78.00

## 2011-11-17 HISTORY — DX: Heart failure, unspecified: I50.9

## 2011-11-17 HISTORY — DX: Crohn's disease, unspecified, without complications: K50.90

## 2011-11-17 LAB — DIFFERENTIAL
Basophils Absolute: 0 10*3/uL (ref 0.0–0.1)
Basophils Relative: 0 % (ref 0–1)
Eosinophils Absolute: 0 10*3/uL (ref 0.0–0.7)
Neutrophils Relative %: 85 % — ABNORMAL HIGH (ref 43–77)

## 2011-11-17 LAB — COMPREHENSIVE METABOLIC PANEL
ALT: 17 U/L (ref 0–53)
Albumin: 3.4 g/dL — ABNORMAL LOW (ref 3.5–5.2)
Alkaline Phosphatase: 108 U/L (ref 39–117)
Potassium: 3.8 mEq/L (ref 3.5–5.1)
Sodium: 138 mEq/L (ref 135–145)
Total Protein: 7.5 g/dL (ref 6.0–8.3)

## 2011-11-17 LAB — GLUCOSE, CAPILLARY: Glucose-Capillary: 282 mg/dL — ABNORMAL HIGH (ref 70–99)

## 2011-11-17 LAB — POCT I-STAT TROPONIN I
Troponin i, poc: 0 ng/mL (ref 0.00–0.08)
Troponin i, poc: 0.01 ng/mL (ref 0.00–0.08)

## 2011-11-17 LAB — CARDIAC PANEL(CRET KIN+CKTOT+MB+TROPI)
CK, MB: 3.6 ng/mL (ref 0.3–4.0)
Relative Index: 1.7 (ref 0.0–2.5)
Total CK: 211 U/L (ref 7–232)
Troponin I: <0.3 ng/mL (ref <0.30)

## 2011-11-17 LAB — CBC
HCT: 38.5 % — ABNORMAL LOW (ref 39.0–52.0)
Hemoglobin: 12.9 g/dL — ABNORMAL LOW (ref 13.0–17.0)
MCH: 27.9 pg (ref 26.0–34.0)
MCH: 27.7 pg (ref 26.0–34.0)
MCHC: 34 g/dL (ref 30.0–36.0)
MCHC: 33.5 g/dL (ref 30.0–36.0)
MCV: 82.8 fL (ref 78.0–100.0)
Platelets: 177 10*3/uL (ref 150–400)
RBC: 4.65 MIL/uL (ref 4.22–5.81)

## 2011-11-17 LAB — CREATININE, SERUM: Creatinine, Ser: 0.85 mg/dL (ref 0.50–1.35)

## 2011-11-17 LAB — HEMOGLOBIN A1C
Hgb A1c MFr Bld: 9.8 % — ABNORMAL HIGH (ref <5.7)
Mean Plasma Glucose: 235 mg/dL — ABNORMAL HIGH (ref <117)

## 2011-11-17 LAB — PROTIME-INR: Prothrombin Time: 12.6 seconds (ref 11.6–15.2)

## 2011-11-17 LAB — PRO B NATRIURETIC PEPTIDE: Pro B Natriuretic peptide (BNP): 2509 pg/mL — ABNORMAL HIGH (ref 0–125)

## 2011-11-17 MED ORDER — OXYCODONE HCL 5 MG PO TABS: 5.0000 mg | ORAL_TABLET | ORAL | Status: DC | PRN

## 2011-11-17 MED ORDER — INSULIN ASPART 100 UNIT/ML ~~LOC~~ SOLN
0.0000 [IU] | Freq: Three times a day (TID) | SUBCUTANEOUS | Status: DC
  Administered 2011-11-17: 8 [IU] via SUBCUTANEOUS
  Administered 2011-11-18 (×2): 2 [IU] via SUBCUTANEOUS

## 2011-11-17 MED ORDER — LISINOPRIL 40 MG PO TABS
40.0000 mg | ORAL_TABLET | Freq: Every day | ORAL | Status: DC
  Administered 2011-11-17 – 2011-11-18 (×2): 40 mg via ORAL
  Filled 2011-11-17 (×2): qty 1

## 2011-11-17 MED ORDER — MORPHINE SULFATE 2 MG/ML IJ SOLN: 1.0000 mg | INTRAMUSCULAR | Status: DC | PRN

## 2011-11-17 MED ORDER — ACETAMINOPHEN 650 MG RE SUPP: 650.0000 mg | Freq: Four times a day (QID) | RECTAL | Status: DC | PRN

## 2011-11-17 MED ORDER — ONDANSETRON HCL 4 MG PO TABS: 4.0000 mg | ORAL_TABLET | Freq: Four times a day (QID) | ORAL | Status: DC | PRN

## 2011-11-17 MED ORDER — WARFARIN - PHARMACIST DOSING INPATIENT: Freq: Every day | Status: DC

## 2011-11-17 MED ORDER — LORAZEPAM 0.5 MG PO TABS: 0.2500 mg | ORAL_TABLET | Freq: Two times a day (BID) | ORAL | Status: DC | PRN

## 2011-11-17 MED ORDER — CARVEDILOL 25 MG PO TABS
25.0000 mg | ORAL_TABLET | Freq: Two times a day (BID) | ORAL | Status: DC
  Administered 2011-11-17 – 2011-11-18 (×3): 25 mg via ORAL
  Filled 2011-11-17 (×4): qty 1

## 2011-11-17 MED ORDER — ALUM & MAG HYDROXIDE-SIMETH 200-200-20 MG/5ML PO SUSP: 30.0000 mL | Freq: Four times a day (QID) | ORAL | Status: DC | PRN

## 2011-11-17 MED ORDER — FUROSEMIDE 40 MG PO TABS
60.0000 mg | ORAL_TABLET | Freq: Two times a day (BID) | ORAL | Status: DC
  Administered 2011-11-17 – 2011-11-18 (×2): 60 mg via ORAL
  Filled 2011-11-17 (×4): qty 1

## 2011-11-17 MED ORDER — ENOXAPARIN SODIUM 40 MG/0.4ML ~~LOC~~ SOLN
40.0000 mg | SUBCUTANEOUS | Status: DC
  Administered 2011-11-17 – 2011-11-18 (×2): 40 mg via SUBCUTANEOUS
  Filled 2011-11-17 (×2): qty 0.4

## 2011-11-17 MED ORDER — WARFARIN SODIUM 10 MG PO TABS
10.0000 mg | ORAL_TABLET | Freq: Once | ORAL | Status: AC
  Administered 2011-11-17: 10 mg via ORAL
  Filled 2011-11-17: qty 1

## 2011-11-17 MED ORDER — ACETAMINOPHEN 325 MG PO TABS: 650.0000 mg | ORAL_TABLET | Freq: Four times a day (QID) | ORAL | Status: DC | PRN

## 2011-11-17 MED ORDER — METOCLOPRAMIDE HCL 5 MG/ML IJ SOLN
10.0000 mg | Freq: Once | INTRAMUSCULAR | Status: AC
  Administered 2011-11-17: 10 mg via INTRAVENOUS
  Filled 2011-11-17: qty 2

## 2011-11-17 MED ORDER — SODIUM CHLORIDE 0.9 % IV SOLN
Freq: Once | INTRAVENOUS | Status: AC
  Administered 2011-11-17: 100 mL/h via INTRAVENOUS

## 2011-11-17 MED ORDER — ASPIRIN 81 MG PO TABS: 81.0000 mg | ORAL_TABLET | Freq: Every day | ORAL | Status: DC

## 2011-11-17 MED ORDER — FUROSEMIDE 10 MG/ML IJ SOLN
60.0000 mg | Freq: Two times a day (BID) | INTRAMUSCULAR | Status: DC
  Administered 2011-11-17 – 2011-11-18 (×2): 60 mg via INTRAVENOUS
  Filled 2011-11-17 (×6): qty 6

## 2011-11-17 MED ORDER — INSULIN GLARGINE 100 UNIT/ML ~~LOC~~ SOLN
100.0000 [IU] | Freq: Every day | SUBCUTANEOUS | Status: DC
  Administered 2011-11-17: 100 [IU] via SUBCUTANEOUS

## 2011-11-17 MED ORDER — SODIUM CHLORIDE 0.9 % IV SOLN: 250.0000 mL | INTRAVENOUS | Status: DC | PRN

## 2011-11-17 MED ORDER — ASPIRIN 81 MG PO CHEW
324.0000 mg | CHEWABLE_TABLET | Freq: Once | ORAL | Status: AC
  Administered 2011-11-17: 324 mg via ORAL
  Filled 2011-11-17: qty 4

## 2011-11-17 MED ORDER — ONDANSETRON 4 MG PO TBDP
8.0000 mg | ORAL_TABLET | Freq: Once | ORAL | Status: AC
  Administered 2011-11-17: 8 mg via ORAL
  Filled 2011-11-17: qty 2

## 2011-11-17 MED ORDER — SODIUM CHLORIDE 0.9 % IJ SOLN: 3.0000 mL | Freq: Two times a day (BID) | INTRAMUSCULAR | Status: DC

## 2011-11-17 MED ORDER — ONDANSETRON HCL 4 MG/2ML IJ SOLN
4.0000 mg | Freq: Once | INTRAMUSCULAR | Status: AC
  Administered 2011-11-17: 4 mg via INTRAVENOUS
  Filled 2011-11-17: qty 2

## 2011-11-17 MED ORDER — WARFARIN SODIUM 7.5 MG PO TABS: 7.5000 mg | ORAL_TABLET | Freq: Every day | ORAL | Status: DC

## 2011-11-17 MED ORDER — SODIUM CHLORIDE 0.9 % IV BOLUS (SEPSIS): 500.0000 mL | Freq: Once | INTRAVENOUS | Status: DC

## 2011-11-17 MED ORDER — ONDANSETRON HCL 4 MG/2ML IJ SOLN: 4.0000 mg | Freq: Four times a day (QID) | INTRAMUSCULAR | Status: DC | PRN

## 2011-11-17 MED ORDER — POTASSIUM CHLORIDE CRYS ER 10 MEQ PO TBCR
10.0000 meq | EXTENDED_RELEASE_TABLET | Freq: Every day | ORAL | Status: DC
  Administered 2011-11-17 – 2011-11-18 (×2): 10 meq via ORAL
  Filled 2011-11-17 (×2): qty 1

## 2011-11-17 MED ORDER — SODIUM CHLORIDE 0.9 % IJ SOLN: 3.0000 mL | INTRAMUSCULAR | Status: DC | PRN

## 2011-11-17 MED ORDER — ATORVASTATIN CALCIUM 80 MG PO TABS
80.0000 mg | ORAL_TABLET | Freq: Every day | ORAL | Status: DC
  Administered 2011-11-17 – 2011-11-18 (×2): 80 mg via ORAL
  Filled 2011-11-17 (×2): qty 1

## 2011-11-17 MED ORDER — SODIUM CHLORIDE 0.9 % IJ SOLN
3.0000 mL | Freq: Two times a day (BID) | INTRAMUSCULAR | Status: DC
  Administered 2011-11-17 – 2011-11-18 (×2): 3 mL via INTRAVENOUS

## 2011-11-17 MED ORDER — ASPIRIN 81 MG PO CHEW
81.0000 mg | CHEWABLE_TABLET | Freq: Every day | ORAL | Status: DC
  Administered 2011-11-18: 81 mg via ORAL
  Filled 2011-11-17: qty 1

## 2011-11-17 MED ORDER — ISOSORBIDE MONONITRATE ER 60 MG PO TB24
60.0000 mg | ORAL_TABLET | Freq: Every day | ORAL | Status: DC
  Administered 2011-11-17 – 2011-11-18 (×2): 60 mg via ORAL
  Filled 2011-11-17 (×2): qty 1

## 2011-11-17 MED ORDER — NITROGLYCERIN 0.3 MG SL SUBL
0.3000 mg | SUBLINGUAL_TABLET | SUBLINGUAL | Status: DC | PRN
  Filled 2011-11-17: qty 100

## 2011-11-17 MED ORDER — PANTOPRAZOLE SODIUM 40 MG PO TBEC
40.0000 mg | DELAYED_RELEASE_TABLET | Freq: Two times a day (BID) | ORAL | Status: DC
  Administered 2011-11-18 (×2): 40 mg via ORAL
  Filled 2011-11-17 (×2): qty 1

## 2011-11-17 NOTE — ED Notes (Signed)
Pt c/o n/v onset when waking this am, then chest discomfort. Denies pain now. A&Ox4, resp e/u

## 2011-11-17 NOTE — ED Notes (Signed)
CBG 282

## 2011-11-17 NOTE — Progress Notes (Signed)
ANTICOAGULATION CONSULT NOTE - Initial Consult  Pharmacy Consult for Coumadin Indication: History of DVT  No Known Allergies  Patient Measurements: Height: 5' 8"  (172.7 cm) Weight: 190 lb (86.183 kg) IBW/kg (Calculated) : 68.4  Heparin Dosing Weight:   Vital Signs: Temp: 98.2 F (36.8 C) (03/14 1450) Temp src: Oral (03/14 1450) BP: 159/86 mmHg (03/14 1553) Pulse Rate: 83  (03/14 1450)  Labs:  Basename 11/17/11 1202  HGB 12.8*  HCT 37.7*  PLT 177  APTT --  LABPROT 12.6  INR 0.92  HEPARINUNFRC --  CREATININE 0.85  CKTOTAL --  CKMB --  TROPONINI --   Estimated Creatinine Clearance: 114.7 ml/min (by C-G formula based on Cr of 0.85).  Medical History: Past Medical History  Diagnosis Date  . Coronary artery disease      Widely patent previously placed stents in the left anterior   . Type II or unspecified type diabetes mellitus without mention of complication, not stated as uncontrolled   . Ischemic cardiomyopathy     Ejection fraction 15-20% catheterization 2010  . Hypertension, essential   . Automatic implantable cardiac defibrillator in situ     st Judes  . Benign neoplasm of colon   . Unspecified gastritis and gastroduodenitis without mention of hemorrhage   . 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 Hypersomnolent     Previous diagnosis of narcolepsy  . Crohn's disease   . High cholesterol   . CHF (congestive heart failure)   . Myocardial infarction 2006  . Angina   . GERD (gastroesophageal reflux disease)     Medications:  Scheduled:    . sodium chloride   Intravenous Once  . aspirin  324 mg Oral Once  . furosemide  60 mg Intravenous Q12H  . insulin aspart  0-15 Units Subcutaneous TID WC  . metoCLOPramide (REGLAN) injection  10 mg Intravenous Once  . ondansetron  4 mg Intravenous Once  . ondansetron  8 mg Oral Once  . warfarin  10 mg Oral ONCE-1800  . Warfarin - Pharmacist Dosing Inpatient   Does not apply q1800  . DISCONTD: sodium  chloride  500 mL Intravenous Once    Assessment: 46 yr old man admitted for nausea, vomiting and chest discomfort. He is on coumadin for a history of DVT but with and INR of 0.92, it does not appear that he is taking it. Pt has CAD with stents placed, Ischemic cardiomyopathy (EF 15-20%), St Jude defibrillator, and history of MI. Goal of Therapy:  INR 2-3   Plan: Pt's home dose of coumadin is 7.5 mg daily. With a subtherapeutic INR of 0.9, will give 10 mg dose today. Will f/u with daily INR.   Minta Balsam 11/17/2011,3:54 PM

## 2011-11-17 NOTE — ED Provider Notes (Signed)
History     CSN: [id number]  Arrival date & time 11/17/11  1108   First MD Initiated Contact with Patient 11/17/11 1126      Chief Complaint  Patient presents with  . Emesis  . Chest Pain    (Consider location/radiation/quality/duration/timing/severity/associated sxs/prior treatment) HPI Comments: Patient with history of MI, ischemic cardiomyopathy with ejection fraction of 15-20%, status post St. Jude ICD, a cardiac cath in 2010? with patent stents to LAD, partial colectomy due to Crohn's disease, history of DVT on Coumadin -- presents with onset of nausea and vomiting, vague left-sided chest pain, and mild abdominal pain. Chest discomfort was short lived, approximately 20 minutes, and resolved spontaneously. Patient continued to vomit. No shocks from ICD. The treatments prior to arrival. Nothing makes the pain better or worse. Patient states that his symptoms are similar to his previous heart attacks.  Patient is a 46 y.o. male presenting with chest pain. The history is provided by the patient and medical records.  Chest Pain The chest pain began 3 - 5 hours ago. Duration of episode(s) is 20 minutes. The chest pain is resolved. The severity of the pain is mild. The quality of the pain is described as aching. The pain does not radiate. Primary symptoms include abdominal pain, nausea and vomiting. Pertinent negatives for primary symptoms include no fever, no shortness of breath, no cough and no palpitations.  Pertinent negatives for associated symptoms include no diaphoresis. He tried nothing for the symptoms.  His past medical history is significant for DVT, MI and pacemaker.  Procedure history is positive for cardiac catheterization.     Past Medical History  Diagnosis Date  . Coronary artery disease      Widely patent previously placed stents in the left anterior   . Type II or unspecified type diabetes mellitus without mention of complication, not stated as uncontrolled   .  Ischemic cardiomyopathy     Ejection fraction 15-20% catheterization 2010  . Hypertension, essential   . Automatic implantable cardiac defibrillator in situ     st Judes  . Benign neoplasm of colon   . Unspecified gastritis and gastroduodenitis without mention of hemorrhage   . 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 Hypersomnolent     Previous diagnosis of narcolepsy    Past Surgical History  Procedure Date  . Colon surgery   . Appendectomy   . Fetal surgery for congenital hernia     History reviewed. No pertinent family history.  History  Substance Use Topics  . Smoking status: Never Smoker   . Smokeless tobacco: Not on file  . Alcohol Use: No      Review of Systems  Constitutional: Negative for fever and diaphoresis.  HENT: Negative for neck pain.   Eyes: Negative for redness.  Respiratory: Negative for cough and shortness of breath.   Cardiovascular: Positive for chest pain. Negative for palpitations and leg swelling.  Gastrointestinal: Positive for nausea, vomiting and abdominal pain. Negative for diarrhea and blood in stool.  Genitourinary: Negative for dysuria.  Musculoskeletal: Negative for back pain.  Skin: Negative for rash.  Neurological: Negative for syncope and light-headedness.    Allergies  Review of patient's allergies indicates no known allergies.  Home Medications   Current Outpatient Rx  Name Route Sig Dispense Refill  . ASPIRIN EC 81 MG PO TBEC Oral Take 81 mg by mouth daily.    . ATORVASTATIN CALCIUM 80 MG PO TABS Oral Take 80 mg by  mouth daily.      [name] CARVEDILOL 25 MG PO TABS Oral Take 25 mg by mouth 2 (two) times daily with a meal.      . ESOMEPRAZOLE MAGNESIUM 40 MG PO CPDR Oral Take 40 mg by mouth daily.      . FUROSEMIDE 20 MG PO TABS Oral Take 20 mg by mouth 2 (two) times daily. Take 3 tabs twice a day    . INSULIN GLARGINE 100 UNIT/ML [place] SOLN Subcutaneous Inject 100 Units into the skin at bedtime.    . ISOSORBIDE  MONONITRATE ER 60 MG PO TB24 Oral Take 60 mg by mouth daily.      [name] LISINOPRIL 20 MG PO TABS Oral Take 40 mg by mouth daily.     [name] POTASSIUM CHLORIDE 10 MEQ PO TBCR Oral Take 10 mEq by mouth daily.      . WARFARIN SODIUM 7.5 MG PO TABS Oral Take 7.5 mg by mouth daily.        BP 170/89  Pulse 76  Temp(Src) 97.6 F (36.4 C) (Oral)  Resp 20  Ht 5' 8"  (1.727 m)  Wt 190 lb (86.183 kg)  BMI 28.89 kg/m2  SpO2 96%  Physical Exam  Nursing note and vitals reviewed. Constitutional: He is oriented to person, place, and time. He appears well-developed and well-nourished.  HENT:  Head: Normocephalic and atraumatic.  Eyes: Conjunctivae are normal. Right eye exhibits no discharge. Left eye exhibits no discharge.  Neck: Normal range of motion. Neck supple.  Cardiovascular: Normal rate, regular rhythm and normal heart sounds.   Pulmonary/Chest: Effort normal and breath sounds normal.       ICD pocket noted  Abdominal: Soft. Bowel sounds are normal. There is no tenderness. There is no rebound and no guarding.  Neurological: He is alert and oriented to person, place, and time.  Skin: Skin is warm and dry.  Psychiatric: He has a normal mood and affect.    ED Course  Procedures (including critical care time)  Labs Reviewed  CBC - Abnormal; Notable for the following:    Hemoglobin 12.8 (*)    HCT 37.7 (*)    All other components within normal limits  DIFFERENTIAL - Abnormal; Notable for the following:    Neutrophils Relative 85 (*)    Neutro Abs 7.9 (*)    All other components within normal limits  COMPREHENSIVE METABOLIC PANEL - Abnormal; Notable for the following:    Glucose, Bld 287 (*)    Albumin 3.4 (*)    All other components within normal limits  PROTIME-INR  POCT I-STAT TROPONIN I  PRO B NATRIURETIC PEPTIDE   Dg Chest Portable 1 View  11/17/2011  *RADIOLOGY REPORT*  Clinical Data: Chest pain, weakness, lightheadedness  PORTABLE CHEST - 1 VIEW  Comparison: October 07, 2009   Findings: There is cardiomegaly and pulmonary vascular engorgement without frank edema.  A small right pleural effusion is present. The pacemaker leads are intact and stable projecting over the right atrium and ventricle.  IMPRESSION: Cardiomegaly and pulmonary vascular engorgement without frank edema.  Small right effusion.  Original Report Authenticated By: Duayne Cal, M.D.     1. Nausea and vomiting   2. Chest pain   3. Hyperglycemia     11:32 AM Patient seen and examined. Work-up initiated. Medications/aspirin ordered.   Vital signs reviewed and are as follows: Filed Vitals:   11/17/11 1119  BP: 170/89  Pulse: 76  Temp: 97.6 F (36.4 C)  Resp: 20  EKG reviewed with Dr. Wilson Singer.    Date: 11/17/2011  Rate: 78  Rhythm: normal sinus rhythm  QRS Axis: normal  Intervals: normal  ST/T Wave abnormalities: nonspecific T wave changes  Conduction Disutrbances:none  Narrative Interpretation: Q wave III  Old EKG Reviewed: changes noted, new inversion in t-waves in V5-V6  1:22 PM Patient previously discussed with and seen by Dr. Wilson Singer. Cardiology paged. Pertinent findings: EKG changes laterally, subtherapeutic INR (do not suspect PE), neg 1st troponin, elevated glucose without anion gap, x-ray with vascular congestion/no frank edema (no significant failure clinically -- pt not SOB, normal O2 saturation, minimal change from CXR performed 02/20)  1:33 PM I spoke with Dr. Pernell Dupre who will see patient in ED for consult. Will admit medically. Patient has PCP with [facility] but cannot remember name. Hospitalist paged.   1:56 PM Spoke with [facility] with will admit, team 4, tele.   2:44 PM Patient had recent episode of diarrhea.   MDM  Admit for vomiting, CP.         Carlisle Cater, [place] 11/17/11 1445

## 2011-11-17 NOTE — Consult Note (Signed)
Admit date: 11/17/2011 Referring Physician  Malachy Moan. Grandville Silos, M.D. Primary Physician  Harle Battiest, M.D. Primary Cardiologist  Candee Furbish, M.D. Reason for Consultation  abdominal discomfort, nausea, vomiting, and chest pain  ASSESSMENT: 1. Abdominal cramping with associated nausea and vomiting, uncertain etiology  2. Left parasternal chest pain, commencing after several episodes of vomiting and retching. The discomfort is dissimilar to the angina he gets which is a pressure-like sensation that occurs on exertion.  3. 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 Also subsequent circumflex and diagonal PCI in 2005 and 2006. All stents were patent by cardiac catheterization in 2010.  4. Ischemic cardiomyopathy with LVEF 15-20% in 2010, possibly mildly decompensated currently.  5. Biventricular AICD implantation, April 2010  6. Long-standing diabetes mellitus  7. Crohn's disease, status post colectomy  8. Hypertension  9. Bilateral pulmonary emboli 2008 for which he has been on lifetime Coumadin therapy.  10. Chronically abnormal electrocardiogram with evidence of previous large anterior myocardial infarction and flattened to mildly inverted lateral precordial and lateral limb leads T wave inversions on office EKGs unchanged from the tracing obtained today.  PLAN:  1. Observe for possible primary gastrointestinal problem. Patient does have a history of Crohn's disease.  2. Rule out myocardial infarction with serial markers and EKGs.  3. Hold continue his current cardiac medical regimen without new changes.   4. Should cardiac markers become positive I would start IV nitroglycerin, IV heparin, and hold the patient's Coumadin.  5. If cardiac markers and EKGs did not reveal evidence of ischemia/injury, no further cardiac workup may be required   6. Given this evenings Lasix dose  intravenously.   HPI: The patient developed cramping abdominal discomfort after awakening this morning. This was followed shortly thereafter by nausea and vomiting. After several rounds of vomiting he developed a soreness in the left parasternal region. Both the abdominal discomfort and left chest discomfort have not completely resolved. He became concerned because when he had the anterior myocardial infarction in 2005 there was associated nausea and vomiting. He did not have cramping abdominal discomfort at that time.  The patient has had multiple stents placed. He had LAD, diagonal, and circumflex stents placed. They were all patent by heart catheterization in 2010. He has chronic angina with heavy physical activity. The angina is characterized as a pressure-like sensation in his chest. The discomfort today was dissimilar to his typical chest pain.   PMH:   Past Medical History  Diagnosis Date  . Coronary artery disease      Widely patent previously placed stents in the left anterior   . Type II or unspecified type diabetes mellitus without mention of complication, not stated as uncontrolled   . Ischemic cardiomyopathy     Ejection fraction 15-20% catheterization 2010  . Hypertension, essential   . Automatic implantable cardiac defibrillator in situ     [employer]  . Benign neoplasm of colon   . Unspecified gastritis and gastroduodenitis without mention of hemorrhage   . 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 Hypersomnolent     Previous diagnosis of narcolepsy  . Crohn's disease   . High cholesterol   . CHF (congestive heart failure)   . Myocardial infarction 2006  . Angina   . GERD (gastroesophageal reflux disease)      PSH:   Past Surgical History  Procedure Date  . Appendectomy   . Fetal surgery  for congenital hernia   . Colon surgery   . Colectomy     "for Crohn's"  . Cardiac defibrillator placement ?2006    Allergies:  Review of patient's allergies indicates  no known allergies. Prior to Admit Meds:   (Not in a hospital admission) Fam HX:   History reviewed. No pertinent family history. Social HX:    History   Social History  . Marital Status: Married    Spouse Name: N/A    Number of Children: N/A  . Years of Education: N/A   Occupational History  . Not on file.   Social History Main Topics  . Smoking status: Never Smoker   . Smokeless tobacco: Never Used  . Alcohol Use: No  . Drug Use: No  . Sexually Active: No   Other Topics Concern  . Not on file   Social History Narrative  . No narrative on file     Review of Systems: He denies melena and hematochezia. The Crohn's disease has been relatively stable. He denies dysuria. He denies cough and hemoptysis. He denies dyspnea. Appetite and weight have been stable. There've been no neurological symptoms  Physical Exam: Blood pressure 159/86, pulse 83, temperature 98.2 F (36.8 C), temperature source Oral, resp. rate 18, height 5' 8"  (1.727 m), weight 86.183 kg (190 lb), SpO2 94.00%. Weight change:   The patient is in no acute distress. His skin is warm and dry.  HEENT exam is grossly unremarkable.  Neck exam reveals no JVD, no carotid bruits are heard.  Chest reveals faint basilar rales.  Cardiac exam reveals no rub or click. A soft 1/6 systolic murmur is audible. A soft S3 gallop is heard.  The abdomen is nontender, soft, and bowel sounds are slightly high-pitched.  The extremities reveal no edema. Posterior tibial pulses are 2+. Radial pulses are 1+ bilaterally.  The neurological exam is intact. The patient is able to easily articulate his symptoms and and history. Labs:   Lab Results  Component Value Date   WBC 9.3 11/17/2011   HGB 12.8* 11/17/2011   HCT 37.7* 11/17/2011   MCV 82.1 11/17/2011   PLT 177 11/17/2011    Lab 11/17/11 1202  NA 138  K 3.8  CL 102  CO2 27  BUN 15  CREATININE 0.85  CALCIUM 9.2  PROT 7.5  BILITOT 0.3  ALKPHOS 108  ALT 17  AST 17    GLUCOSE 287*   No results found for this basename: PTT   Lab Results  Component Value Date   INR 0.92 11/17/2011   INR 1.35 10/07/2009   INR 1.1 01/03/2009   Lab Results  Component Value Date   CKTOTAL 178 10/08/2009   CKMB 1.7 10/08/2009   TROPONINI  Value: 0.03        NO INDICATION OF MYOCARDIAL INJURY. 10/08/2009     Lab Results  Component Value Date   CHOL  Value: 215        ATP III CLASSIFICATION:  <200     mg/dL   Desirable  200-239  mg/dL   Borderline High  >=240    mg/dL   High       * 10/08/2009   Lab Results  Component Value Date   HDL 51 10/08/2009   Lab Results  Component Value Date   LDLCALC  Value: 135        Total Cholesterol/HDL:CHD Risk Coronary Heart Disease Risk Table  Men   Women  1/2 Average Risk   3.4   3.3  Average Risk       5.0   4.4  2 X Average Risk   9.6   7.1  3 X Average Risk  23.4   11.0        Use the calculated Patient Ratio above and the CHD Risk Table to determine the patient's CHD Risk.        ATP III CLASSIFICATION (LDL):  <100     mg/dL   Optimal  100-129  mg/dL   Near or Above                    Optimal  130-159  mg/dL   Borderline  160-189  mg/dL   High  >190     mg/dL   Very High* 10/08/2009   Lab Results  Component Value Date   TRIG 144 10/08/2009   Lab Results  Component Value Date   CHOLHDL 4.2 10/08/2009   No results found for this basename: LDLDIRECT      Radiology:  Dg Abd 1 View  11/17/2011  *RADIOLOGY REPORT*  Clinical Data: Abdominal pain, nausea, vomiting.  ABDOMEN - 1 VIEW  Comparison: 02/11/2008  Findings: There is a nonobstructive bowel gas pattern.  No supine evidence of free air.  No organomegaly or suspicious calcification.  No acute bony abnormality.  IMPRESSION: No acute findings.  Original Report Authenticated By: Raelyn Number, M.D.   Dg Chest Portable 1 View  11/17/2011  *RADIOLOGY REPORT*  Clinical Data: Chest pain, weakness, lightheadedness  PORTABLE CHEST - 1 VIEW  Comparison: October 07, 2009  Findings:  There is cardiomegaly and pulmonary vascular engorgement without frank edema.  A small right pleural effusion is present. The pacemaker leads are intact and stable projecting over the right atrium and ventricle.  IMPRESSION: Cardiomegaly and pulmonary vascular engorgement without frank edema.  Small right effusion.  Original Report Authenticated By: Duayne Cal, M.D.   EKG:  Normal sinus rhythm, old anterior myocardial infarction, mild T wave inversion in 1, aVL, V4 and 5. This pattern is similar to EKG tracings from our office dating back to 2011  BNP: [place] W 11/17/2011 4:32 PM

## 2011-11-17 NOTE — H&P (Signed)
[name] MRN: [id number] DOB/AGE: 10/10/1965 46 y.o. Primary Care Physician:SKAINS, MARK, MD, MD Admit date: 11/17/2011 Chief Complaint: Nausea and emesis/chest discomfort HPI:  [name] is a 46 year old African American gentleman with history of type 2 diabetes x30 years, ischemic cardiomyopathy with EF of 15-20% per Transitional 2010, history of hypertension, history of automatic ICD placement 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 Patient states that he will cup had some nausea about 5-6 episodes of emesis and that his abdominal cramping that was intermittent in nature. Patient also does endorse some chills. Patient does endorse some chest discomfort which is left substernal nonradiating in nature which occurred and lasted about 2 minutes. Patient states that this chest discomfort has been intermittent and had a minor episode was in the ED. Patient denies any palpitations, no burping, no cough, no diarrhea, no constipation. Patient does endorse some shortness of breath which he states is very little. Patient was seen in the ED EKG which was obtained did show some T wave inversion in leads 1 aVL which were all as well as T-wave inversions in leads V5 through V6 which is new and poor R-wave progression. CBC which was obtained a hemoglobin of 12.8 otherwise was unremarkable. Comprehensive metabolic profile which was obtained it have a glucose of 287 the abdomen of 3.4 otherwise was within normal limits. Chest x-ray which was done showed cardiomegaly and pulmonary vascular engorgement without frank edema and a small right effusion. Will call to admit the patient for further evaluation and management. Cardiology was consulted per ED PA and patient will be seen in the ED.  Past Medical History  Diagnosis Date  . Coronary artery  disease      Widely patent previously placed stents in the left anterior   . Type II or unspecified type diabetes mellitus without mention of complication, not stated as uncontrolled   . Ischemic cardiomyopathy     Ejection fraction 15-20% catheterization 2010  . Hypertension, essential   . Automatic implantable cardiac defibrillator in situ     [employer]  . Benign neoplasm of colon   . Unspecified gastritis and gastroduodenitis without mention of hemorrhage   . 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 Hypersomnolent     Previous diagnosis of narcolepsy  . Crohn's disease   . High cholesterol   . CHF (congestive heart failure)   . Myocardial infarction 2006  . Angina   . GERD (gastroesophageal reflux disease)     Past Surgical History  Procedure Date  . Appendectomy   . Fetal surgery for congenital hernia   . Colon surgery   . Colectomy     "for Crohn's"  . Cardiac defibrillator placement ?2006    Prior to Admission medications   Medication Sig Start Date End Date Taking? Authorizing Provider  aspirin 81 MG tablet Take 81 mg by mouth daily.   Yes Historical Provider, MD  atorvastatin (LIPITOR) 80 MG tablet Take 80 mg by mouth daily.     Yes Historical Provider, MD  carvedilol (COREG) 25 MG tablet Take 25 mg by mouth 2 (two) times daily with a meal.     Yes Historical Provider, MD  esomeprazole (NEXIUM) 40 MG capsule Take 40 mg by mouth daily.     Yes Historical Provider, MD  furosemide (LASIX) 20 MG  tablet Take 60 mg by mouth 2 (two) times daily.   Yes Historical Provider, MD  insulin aspart (NOVOLOG) 100 UNIT/ML injection Inject 10-25 Units into the skin 3 (three) times daily before meals. Sliding scale   Yes Historical Provider, MD  insulin glargine (LANTUS) 100 UNIT/ML injection Inject 100 Units into the skin at bedtime.   Yes Historical Provider, MD  isosorbide mononitrate (IMDUR) 60 MG 24 hr tablet Take 60 mg by mouth daily.     Yes Historical Provider, MD    lisinopril (PRINIVIL,ZESTRIL) 20 MG tablet Take 40 mg by mouth daily.    Yes Historical Provider, MD  potassium chloride (KLOR-CON) 10 MEQ CR tablet Take 10 mEq by mouth daily.     Yes Historical Provider, MD  warfarin (COUMADIN) 7.5 MG tablet Take 7.5 mg by mouth daily.     Yes Historical Provider, MD    Allergies: No Known Allergies  History reviewed. No pertinent family history.  Social History:  reports that he has never smoked. He has never used smokeless tobacco. He reports that he does not drink alcohol or use illicit drugs.  ROS: All systems reviewed with the patient and was positive as per HPI otherwise all other systems are negative.  PHYSICAL EXAM: Blood pressure 153/77, pulse 83, temperature 98.2 F (36.8 C), temperature source Oral, resp. rate 18, height 5' 8"  (1.727 m), weight 86.183 kg (190 lb), SpO2 97.00%. General: Well-developed well-nourished in no acute cardiopulmonary distress.  HEENT: Normocephalic atraumatic. Pupils equal round and reactive to light and accommodation. Extraocular movements intact. Oropharynx is clear, no lesions, no exudates. Neck is supple with no lymphadenopathy. No bruits, no goiter. Heart: Regular rate and rhythm, without murmurs, rubs, gallops. Positive JVD Lungs: Bibasilar crackles. No wheezing. Abdomen: Soft, nontender, nondistended, positive bowel sounds. Extremities: No clubbing cyanosis or edema with positive pedal pulses. Neuro: Alert in order x3. Cranial nerves II through XII are grossly intact. No focal deficits.     EKG: T-wave inversions in lead 1 and aVL which is old. T-wave inversions in leads V5 through V6 which is new. Poor R wave progression.  No results found for this or any previous visit (from the past 240 hour(s)).   Lab results:  [facility] 11/17/11 1202  NA 138  K 3.8  CL 102  CO2 27  GLUCOSE 287*  BUN 15  CREATININE 0.85  CALCIUM 9.2  MG --  PHOS --    Basename 11/17/11 1202  AST 17  ALT 17  ALKPHOS 108   BILITOT 0.3  PROT 7.5  ALBUMIN 3.4*   No results found for this basename: LIPASE:2,AMYLASE:2 in the last 72 hours  Basename 11/17/11 1202  WBC 9.3  NEUTROABS 7.9*  HGB 12.8*  HCT 37.7*  MCV 82.1  PLT 177   No results found for this basename: CKTOTAL:3,CKMB:3,CKMBINDEX:3,TROPONINI:3 in the last 72 hours No components found with this basename: POCBNP:3 No results found for this basename: DDIMER in the last 72 hours No results found for this basename: HGBA1C:2 in the last 72 hours No results found for this basename: CHOL:2,HDL:2,LDLCALC:2,TRIG:2,CHOLHDL:2,LDLDIRECT:2 in the last 72 hours No results found for this basename: TSH,T4TOTAL,FREET3,T3FREE,THYROIDAB in the last 72 hours No results found for this basename: VITAMINB12:2,FOLATE:2,FERRITIN:2,TIBC:2,IRON:2,RETICCTPCT:2 in the last 72 hours Imaging results:  Dg Chest Portable 1 View  11/17/2011  *RADIOLOGY REPORT*  Clinical Data: Chest pain, weakness, lightheadedness  PORTABLE CHEST - 1 VIEW  Comparison: October 07, 2009  Findings: There is cardiomegaly and pulmonary vascular engorgement without frank edema.  A small right pleural effusion is present. The pacemaker leads are intact and stable projecting over the right atrium and ventricle.  IMPRESSION: Cardiomegaly and pulmonary vascular engorgement without frank edema.  Small right effusion.  Original Report Authenticated By: Duayne Cal, M.D.   Impression/Plan:  Principal Problem:  *Nausea and vomiting in adult Active Problems:  DM  HYPERLIPIDEMIA  Coronary artery disease  Ischemic cardiomyopathy  Hypertension, essential  Automatic implantable cardiac defibrillator in situ  Deep venous thrombosis  Chest pain   #1 nausea/ vomiting/ abdominal pain Questionable etiology. May be secondary to a gastroenteritis versus anginal equivalent versus possible gastroparesis. On physical exam there's no tenderness to palpation. Patient has had his appendix taken out in the past. Will  check an acute abdominal x-ray. Will check a gastric emptying study. Will cycle cardiac enzymes every 8 hours x3. Will place on antiemetics and clear liquid diet. Supportive care.  #2 chest discomfort Questionable etiology. Patient with multiple cardiac risk factors. Patient with some EKG changes. Will cycle cardiac enzymes every 8 hours x3. Will check a fasting lipid panel. Will check a 2-D echo. Continue home dose aspirin, Imdur, Coreg, atorvastatin, Lasix, lisinopril. Oxygen, nitroglycerin, morphine sulfate. Cardiology has been consulted.  #3 hyperlipidemia Check a fasting lipid panel. Continue home regimen of Lipitor.  #4 history of recurrent DVT INR subtherapeutic. We'll place back on the Coumadin.  #5 hypertension Resume home regimen of Lasix Imdur lisinopril Coreg.  #6 probable early acute on chronic systolic heart failure Patient's chest x-ray does show some vascular congestion and looks volume overloaded. Patient with some JVD. BNP is elevated at approximately 2500. We'll place on Lasix 60 mg IV every 12 hours. Strict I.'s and O.'s. Daily weights. Continue beta blocker ACE inhibitor, imdur. Cardiology has been consulted.  #7 coronary artery disease status post stents/ischemic cardiomyopathy/AICD See problem #2. Continue home regimen of Imdur, beta blocker, ACE inhibitor, Lipitor. Cardiology is consulted.  #8 diabetes mellitus type 2 Check a hemoglobin A1c. Lantus, sliding scale insulin.  #9 prophylaxis PPI for GI prophylaxis, Lovenox for DVT prophylaxis.   [name] 11/17/2011, 3:06 PM

## 2011-11-18 ENCOUNTER — Inpatient Hospital Stay (HOSPITAL_COMMUNITY): Payer: BC Managed Care – PPO

## 2011-11-18 DIAGNOSIS — K3184 Gastroparesis: Secondary | ICD-10-CM | POA: Diagnosis present

## 2011-11-18 LAB — URINALYSIS, ROUTINE W REFLEX MICROSCOPIC
Bilirubin Urine: NEGATIVE
Leukocytes, UA: NEGATIVE
Nitrite: NEGATIVE
Specific Gravity, Urine: 1.009 (ref 1.005–1.030)
Urobilinogen, UA: 0.2 mg/dL (ref 0.0–1.0)
pH: 5.5 (ref 5.0–8.0)

## 2011-11-18 LAB — BASIC METABOLIC PANEL
CO2: 25 mEq/L (ref 19–32)
Calcium: 9.1 mg/dL (ref 8.4–10.5)
Chloride: 99 mEq/L (ref 96–112)
Creatinine, Ser: 0.94 mg/dL (ref 0.50–1.35)
Glucose, Bld: 57 mg/dL — ABNORMAL LOW (ref 70–99)
Sodium: 140 mEq/L (ref 135–145)

## 2011-11-18 LAB — LIPID PANEL
Cholesterol: 226 mg/dL — ABNORMAL HIGH (ref 0–200)
Triglycerides: 81 mg/dL (ref <150)
VLDL: 16 mg/dL (ref 0–40)

## 2011-11-18 LAB — CBC
HCT: 39 % (ref 39.0–52.0)
MCH: 27.8 pg (ref 26.0–34.0)
MCV: 81.4 fL (ref 78.0–100.0)
Platelets: 221 10*3/uL (ref 150–400)
RBC: 4.79 MIL/uL (ref 4.22–5.81)

## 2011-11-18 LAB — CARDIAC PANEL(CRET KIN+CKTOT+MB+TROPI)
CK, MB: 2.7 ng/mL (ref 0.3–4.0)
Total CK: 134 U/L (ref 7–232)

## 2011-11-18 LAB — GLUCOSE, CAPILLARY: Glucose-Capillary: 55 mg/dL — ABNORMAL LOW (ref 70–99)

## 2011-11-18 LAB — PRO B NATRIURETIC PEPTIDE: Pro B Natriuretic peptide (BNP): 2694 pg/mL — ABNORMAL HIGH (ref 0–125)

## 2011-11-18 LAB — MAGNESIUM: Magnesium: 1.3 mg/dL — ABNORMAL LOW (ref 1.5–2.5)

## 2011-11-18 LAB — APTT: aPTT: 30 seconds (ref 24–37)

## 2011-11-18 LAB — URINE MICROSCOPIC-ADD ON

## 2011-11-18 MED ORDER — METOCLOPRAMIDE HCL 10 MG PO TABS
10.0000 mg | ORAL_TABLET | Freq: Three times a day (TID) | ORAL | Status: DC
  Filled 2011-11-18 (×2): qty 1

## 2011-11-18 MED ORDER — MAGNESIUM OXIDE 400 MG PO TABS: 400.0000 mg | ORAL_TABLET | Freq: Two times a day (BID) | ORAL | Status: DC

## 2011-11-18 MED ORDER — DEXTROSE 50 % IV SOLN
INTRAVENOUS | Status: AC
  Administered 2011-11-18: 50 mL
  Filled 2011-11-18: qty 50

## 2011-11-18 MED ORDER — MAGNESIUM OXIDE 400 MG PO TABS
400.0000 mg | ORAL_TABLET | Freq: Two times a day (BID) | ORAL | Status: DC
  Filled 2011-11-18: qty 1

## 2011-11-18 MED ORDER — MAGNESIUM SULFATE 40 MG/ML IJ SOLN
4.0000 g | Freq: Once | INTRAMUSCULAR | Status: AC
  Administered 2011-11-18: 4 g via INTRAVENOUS
  Filled 2011-11-18: qty 100

## 2011-11-18 MED ORDER — POTASSIUM CHLORIDE CRYS ER 20 MEQ PO TBCR
40.0000 meq | EXTENDED_RELEASE_TABLET | ORAL | Status: AC
  Administered 2011-11-18: 40 meq via ORAL
  Filled 2011-11-18: qty 2

## 2011-11-18 MED ORDER — WARFARIN SODIUM 10 MG PO TABS
10.0000 mg | ORAL_TABLET | Freq: Once | ORAL | Status: AC
  Administered 2011-11-18: 10 mg via ORAL
  Filled 2011-11-18: qty 1

## 2011-11-18 MED ORDER — METOCLOPRAMIDE HCL 10 MG PO TABS: 10.0000 mg | ORAL_TABLET | Freq: Three times a day (TID) | ORAL | Status: DC

## 2011-11-18 MED ORDER — TECHNETIUM TC 99M SULFUR COLLOID
2.0000 | Freq: Once | INTRAVENOUS | Status: AC | PRN
  Administered 2011-11-18: 2 via ORAL

## 2011-11-18 MED ORDER — ONDANSETRON HCL 4 MG PO TABS: 4.0000 mg | ORAL_TABLET | Freq: Four times a day (QID) | ORAL | Status: AC | PRN

## 2011-11-18 NOTE — Progress Notes (Signed)
ANTICOAGULATION CONSULT NOTE - Follow Up Consult  Pharmacy Consult for Coumadin Indication: DVT Hx  No Known Allergies  Patient Measurements: Height: 5' 8"  (172.7 cm) Weight: 185 lb 6.5 oz (84.1 kg) IBW/kg (Calculated) : 68.4    Vital Signs: Temp: 97.5 F (36.4 C) (03/15 0500) Temp src: Oral (03/15 0500) BP: 131/61 mmHg (03/15 0820) Pulse Rate: 60  (03/15 0820)  Labs:  Basename 11/18/11 0530 11/17/11 1751 11/17/11 1546 11/17/11 1202  HGB 13.3 12.9* -- --  HCT 39.0 38.5* -- 37.7*  PLT 221 224 -- 177  APTT 30 -- -- --  LABPROT 13.6 -- -- 12.6  INR 1.02 -- -- 0.92  HEPARINUNFRC -- -- -- --  CREATININE 0.94 0.85 -- 0.85  CKTOTAL -- -- 211 --  CKMB -- -- 3.6 --  TROPONINI -- -- <0.30 --   Estimated Creatinine Clearance: 102.6 ml/min (by C-G formula based on Cr of 0.94).   Assessment: Hx DVT supposed to be taking Couamdin but unlikely given INR 0.9 on admit.  INR 1.02 after 1 dose Coumadin 40m x1.  CBC stable, no s/s bleeding, renal fx stable, VSS.  CAD Hx admitted for chest discomfort, N/V.   Goal of Therapy:  INR 2-3   Plan:  Coumadin 134mx1 today  CuNadine Counts/15/2013,11:31 AM

## 2011-11-18 NOTE — Discharge Summary (Signed)
Discharge Summary  [name] MR#: [id number]  DOB:1965/11/17  Date of Admission: 11/17/2011 Date of Discharge: 11/18/2011  Patient's PCP: Dimas Aguas, MD, MD  Attending Physician:Cloma Rahrig  Consults: Treatment Team:  #1 cardiology: Dr. Allena Earing, MD   Discharge Diagnoses: Nausea and vomiting in adult Present on Admission:  .Nausea and vomiting in adult .Chest pain .DM .HYPERLIPIDEMIA .Coronary artery disease .Ischemic cardiomyopathy .Hypertension, essential .Automatic implantable cardiac defibrillator in situ .Deep venous thrombosis .Systolic CHF, acute on chronic .Gastroparesis due to DM   Brief Admitting History and Physical [name] is a 46 year old African American gentleman with history of type 2 diabetes x30 years, ischemic cardiomyopathy with EF of 15-20% per Transitional 2010, history of hypertension, history of automatic ICD placement 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 Patient states that he will cup had some nausea about 5-6 episodes of emesis and that his abdominal cramping that was intermittent in nature. Patient also does endorse some chills. Patient does endorse some chest discomfort which is left substernal nonradiating in nature which occurred and lasted about 2 minutes. Patient states that this chest discomfort has been intermittent and had a minor episode was in the ED. Patient denies any palpitations, no burping, no cough, no diarrhea, no constipation. Patient does endorse some shortness of breath which he states is very little. Patient was seen in the ED EKG which was obtained did show some T wave inversion in leads 1 aVL which were all as well as T-wave inversions in leads V5 through V6 which is new and poor R-wave progression. CBC which was obtained a hemoglobin of  12.8 otherwise was unremarkable. Comprehensive metabolic profile which was obtained it have a glucose of 287 the abdomen of 3.4 otherwise was within normal limits. Chest x-ray which was done showed cardiomegaly and pulmonary vascular engorgement without frank edema and a small right effusion. Will call to admit the patient for further evaluation and management. Cardiology was consulted per ED PA and patient will be seen in the ED.   Discharge Medications Medication List  As of 11/18/2011  4:53 PM   START taking these medications         magnesium oxide 400 MG tablet   Commonly known as: MAG-OX   Take 1 tablet (400 mg total) by mouth 2 (two) times daily.      metoCLOPramide 10 MG tablet   Commonly known as: REGLAN   Take 1 tablet (10 mg total) by mouth 4 (four) times daily -  before meals and at bedtime.      ondansetron 4 MG tablet   Commonly known as: ZOFRAN   Take 1 tablet (4 mg total) by mouth every 6 (six) hours as needed for nausea.         CONTINUE taking these medications         aspirin 81 MG tablet      carvedilol 25 MG tablet   Commonly known as: COREG      COUMADIN 7.5 MG tablet   Generic drug: warfarin      furosemide 20 MG tablet   Commonly known as: LASIX      insulin aspart 100 UNIT/ML injection   Commonly known as: novoLOG      insulin glargine 100 UNIT/ML injection   Commonly known as: LANTUS      isosorbide mononitrate 60  MG 24 hr tablet   Commonly known as: IMDUR      LIPITOR 80 MG tablet   Generic drug: atorvastatin      lisinopril 20 MG tablet   Commonly known as: PRINIVIL,ZESTRIL      NEXIUM 40 MG capsule   Generic drug: esomeprazole      potassium chloride 10 MEQ CR tablet   Commonly known as: KLOR-CON          Where to get your medications    These are the prescriptions that you need to pick up.   You may get these medications from any pharmacy.         magnesium oxide 400 MG tablet   metoCLOPramide 10 MG tablet   ondansetron 4 MG  tablet           Hospital Course: 1. Nausea and vomiting in adult/gastroparesis Patient was admitted with a several hour history of nausea and vomiting. Patient was initially placed on clear liquids placed on antiemetics as well as supportive care. Acute abdominal series was obtained which was negative for any obstruction. Patient was monitored he improved clinically. Cardiac enzymes were cycled which were negative x3. A gastric emptying study was done which was consistent with delayed gastric emptying. Patient improved clinically and adamantly wanted to be discharged on day of discharge. It was recommended to patient that his diet needed to be advanced to see what he's able to tolerate it and patient needed to be started on Reglan. Patient was still insistent on going home and as such will be discharged home in stable condition. Patient will be discharged home on Reglan and will followup with PCP in one week post discharge.  #2 chest pain On admission patient had some complaints of chest pain. Patient did have an extensive cardiac history of multiple risk factors. Patient did have some EKG changes and a such a cardiology consultation was obtained. Cardiac enzymes were cycled which were negative x3. A 2-D echo was obtained and was pending at the time of discharge which will need to be followed up upon. Patient was seen in consultation by Dr. Daneen Schick and followed by Dr. Marlou Porch during the hospitalization. Patient was also placed on IV Lasix as was felt to be in acute exacerbation. Per cardiology cardiac enzymes were negative and patient was asymptomatic no further cardiac workup was needed. Patient improved clinically and was deemed stable for discharge per cardiology. Patient will followup with cardiology as outpatient.  #3 early CHF On admission patient was noted to have an elevated BNP. Patient was placed on IV Lasix with clinical improvement. Patient was seen by cardiology cardiac enzymes which  was cycled were negative x3. 2-D echo was pending at the time of discharge. Patient has been changed back to his oral home dose of Lasix and the discharged home on this. Patient followup with his cardiologist as outpatient.   Present on Admission:  .Nausea and vomiting in adult .Chest pain .DM .HYPERLIPIDEMIA .Coronary artery disease .Ischemic cardiomyopathy .Hypertension, essential .Automatic implantable cardiac defibrillator in situ .Deep venous thrombosis .Systolic CHF, acute on chronic .Gastroparesis due to DM   Day of Discharge BP 113/66  Pulse 60  Temp(Src) 97.5 F (36.4 C) (Oral)  Resp 20  Ht 5' 8"  (1.727 m)  Wt 84.1 kg (185 lb 6.5 oz)  BMI 28.19 kg/m2  SpO2 100% General: Alert, awake, oriented x3, in no acute distress. HEENT: No bruits, no goiter. Heart: Regular rate and rhythm, without murmurs, rubs, gallops. Lungs:  Clear to auscultation bilaterally. Abdomen: Soft, nontender, nondistended, positive bowel sounds. Extremities: No clubbing cyanosis or edema with positive pedal pulses.   Results for orders placed during the hospital encounter of 11/17/11 (from the past 48 hour(s))  CBC     Status: Abnormal   Collection Time   11/17/11 12:02 PM      Component Value Range Comment   WBC 9.3  4.0 - 10.5 (K/uL)    RBC 4.59  4.22 - 5.81 (MIL/uL)    Hemoglobin 12.8 (*) 13.0 - 17.0 (g/dL)    HCT 37.7 (*) 39.0 - 52.0 (%)    MCV 82.1  78.0 - 100.0 (fL)    MCH 27.9  26.0 - 34.0 (pg)    MCHC 34.0  30.0 - 36.0 (g/dL)    RDW 13.8  11.5 - 15.5 (%)    Platelets 177  150 - 400 (K/uL)   DIFFERENTIAL     Status: Abnormal   Collection Time   11/17/11 12:02 PM      Component Value Range Comment   Neutrophils Relative 85 (*) 43 - 77 (%)    Neutro Abs 7.9 (*) 1.7 - 7.7 (K/uL)    Lymphocytes Relative 12  12 - 46 (%)    Lymphs Abs 1.1  0.7 - 4.0 (K/uL)    Monocytes Relative 3  3 - 12 (%)    Monocytes Absolute 0.3  0.1 - 1.0 (K/uL)    Eosinophils Relative 0  0 - 5 (%)     Eosinophils Absolute 0.0  0.0 - 0.7 (K/uL)    Basophils Relative 0  0 - 1 (%)    Basophils Absolute 0.0  0.0 - 0.1 (K/uL)   COMPREHENSIVE METABOLIC PANEL     Status: Abnormal   Collection Time   11/17/11 12:02 PM      Component Value Range Comment   Sodium 138  135 - 145 (mEq/L)    Potassium 3.8  3.5 - 5.1 (mEq/L)    Chloride 102  96 - 112 (mEq/L)    CO2 27  19 - 32 (mEq/L)    Glucose, Bld 287 (*) 70 - 99 (mg/dL)    BUN 15  6 - 23 (mg/dL)    Creatinine, Ser 0.85  0.50 - 1.35 (mg/dL)    Calcium 9.2  8.4 - 10.5 (mg/dL)    Total Protein 7.5  6.0 - 8.3 (g/dL)    Albumin 3.4 (*) 3.5 - 5.2 (g/dL)    AST 17  0 - 37 (U/L)    ALT 17  0 - 53 (U/L)    Alkaline Phosphatase 108  39 - 117 (U/L)    Total Bilirubin 0.3  0.3 - 1.2 (mg/dL)    GFR calc non Af Amer >90  >90 (mL/min)    GFR calc Af Amer >90  >90 (mL/min)   PROTIME-INR     Status: Normal   Collection Time   11/17/11 12:02 PM      Component Value Range Comment   Prothrombin Time 12.6  11.6 - 15.2 (seconds)    INR 0.92  0.00 - 1.49    POCT I-STAT TROPONIN I     Status: Normal   Collection Time   11/17/11 12:08 PM      Component Value Range Comment   Troponin i, poc 0.00  0.00 - 0.08 (ng/mL)    Comment 3            PRO B NATRIURETIC PEPTIDE     Status: Abnormal  Collection Time   11/17/11  1:58 PM      Component Value Range Comment   Pro B Natriuretic peptide (BNP) 2509.0 (*) 0 - 125 (pg/mL)   GLUCOSE, CAPILLARY     Status: Abnormal   Collection Time   11/17/11  3:17 PM      Component Value Range Comment   Glucose-Capillary 282 (*) 70 - 99 (mg/dL)    Comment 1 Documented in Chart      Comment 2 Notify RN     CARDIAC PANEL(CRET KIN+CKTOT+MB+TROPI)     Status: Normal   Collection Time   11/17/11  3:46 PM      Component Value Range Comment   Total CK 211  7 - 232 (U/L)    CK, MB 3.6  0.3 - 4.0 (ng/mL)    Troponin I <0.30  <0.30 (ng/mL)    Relative Index 1.7  0.0 - 2.5    POCT I-STAT TROPONIN I     Status: Normal   Collection  Time   11/17/11  3:54 PM      Component Value Range Comment   Troponin i, poc 0.01  0.00 - 0.08 (ng/mL)    Comment 3            GLUCOSE, CAPILLARY     Status: Abnormal   Collection Time   11/17/11  4:44 PM      Component Value Range Comment   Glucose-Capillary 275 (*) 70 - 99 (mg/dL)   CBC     Status: Abnormal   Collection Time   11/17/11  5:51 PM      Component Value Range Comment   WBC 10.2  4.0 - 10.5 (K/uL)    RBC 4.65  4.22 - 5.81 (MIL/uL)    Hemoglobin 12.9 (*) 13.0 - 17.0 (g/dL)    HCT 38.5 (*) 39.0 - 52.0 (%)    MCV 82.8  78.0 - 100.0 (fL)    MCH 27.7  26.0 - 34.0 (pg)    MCHC 33.5  30.0 - 36.0 (g/dL)    RDW 13.9  11.5 - 15.5 (%)    Platelets 224  150 - 400 (K/uL)   CREATININE, SERUM     Status: Normal   Collection Time   11/17/11  5:51 PM      Component Value Range Comment   Creatinine, Ser 0.85  0.50 - 1.35 (mg/dL)    GFR calc non Af Amer >90  >90 (mL/min)    GFR calc Af Amer >90  >90 (mL/min)   HEMOGLOBIN A1C     Status: Abnormal   Collection Time   11/17/11  5:51 PM      Component Value Range Comment   Hemoglobin A1C 9.8 (*) <5.7 (%)    Mean Plasma Glucose 235 (*) <117 (mg/dL)   GLUCOSE, CAPILLARY     Status: Abnormal   Collection Time   11/17/11  8:10 PM      Component Value Range Comment   Glucose-Capillary 182 (*) 70 - 99 (mg/dL)    Comment 1 Notify RN     PRO B NATRIURETIC PEPTIDE     Status: Abnormal   Collection Time   11/18/11  5:00 AM      Component Value Range Comment   Pro B Natriuretic peptide (BNP) 2694.0 (*) 0 - 125 (pg/mL)   MAGNESIUM     Status: Abnormal   Collection Time   11/18/11  5:30 AM      Component Value Range Comment  Magnesium 1.3 (*) 1.5 - 2.5 (mg/dL)   BASIC METABOLIC PANEL     Status: Abnormal   Collection Time   11/18/11  5:30 AM      Component Value Range Comment   Sodium 140  135 - 145 (mEq/L)    Potassium 3.2 (*) 3.5 - 5.1 (mEq/L)    Chloride 99  96 - 112 (mEq/L)    CO2 25  19 - 32 (mEq/L)    Glucose, Bld 57 (*) 70 - 99  (mg/dL)    BUN 15  6 - 23 (mg/dL)    Creatinine, Ser 0.94  0.50 - 1.35 (mg/dL)    Calcium 9.1  8.4 - 10.5 (mg/dL)    GFR calc non Af Amer >90  >90 (mL/min)    GFR calc Af Amer >90  >90 (mL/min)   CBC     Status: Normal   Collection Time   11/18/11  5:30 AM      Component Value Range Comment   WBC 9.4  4.0 - 10.5 (K/uL)    RBC 4.79  4.22 - 5.81 (MIL/uL)    Hemoglobin 13.3  13.0 - 17.0 (g/dL)    HCT 39.0  39.0 - 52.0 (%)    MCV 81.4  78.0 - 100.0 (fL)    MCH 27.8  26.0 - 34.0 (pg)    MCHC 34.1  30.0 - 36.0 (g/dL)    RDW 13.9  11.5 - 15.5 (%)    Platelets 221  150 - 400 (K/uL)   PROTIME-INR     Status: Normal   Collection Time   11/18/11  5:30 AM      Component Value Range Comment   Prothrombin Time 13.6  11.6 - 15.2 (seconds)    INR 1.02  0.00 - 1.49    APTT     Status: Normal   Collection Time   11/18/11  5:30 AM      Component Value Range Comment   aPTT 30  24 - 37 (seconds)   LIPID PANEL     Status: Abnormal   Collection Time   11/18/11  5:30 AM      Component Value Range Comment   Cholesterol 226 (*) 0 - 200 (mg/dL)    Triglycerides 81  <150 (mg/dL)    HDL 64  >39 (mg/dL)    Total CHOL/HDL Ratio 3.5      VLDL 16  0 - 40 (mg/dL)    LDL Cholesterol 146 (*) 0 - 99 (mg/dL)   URINALYSIS, ROUTINE W REFLEX MICROSCOPIC     Status: Abnormal   Collection Time   11/18/11  7:36 AM      Component Value Range Comment   Color, Urine YELLOW  YELLOW     APPearance CLEAR  CLEAR     Specific Gravity, Urine 1.009  1.005 - 1.030     pH 5.5  5.0 - 8.0     Glucose, UA NEGATIVE  NEGATIVE (mg/dL)    Hgb urine dipstick NEGATIVE  NEGATIVE     Bilirubin Urine NEGATIVE  NEGATIVE     Ketones, ur NEGATIVE  NEGATIVE (mg/dL)    Protein, ur 30 (*) NEGATIVE (mg/dL)    Urobilinogen, UA 0.2  0.0 - 1.0 (mg/dL)    Nitrite NEGATIVE  NEGATIVE     Leukocytes, UA NEGATIVE  NEGATIVE    URINE MICROSCOPIC-ADD ON     Status: Normal   Collection Time   11/18/11  7:36 AM      Component Value  Range Comment    Squamous Epithelial / LPF RARE  RARE     Bacteria, UA RARE  RARE    GLUCOSE, CAPILLARY     Status: Abnormal   Collection Time   11/18/11  7:54 AM      Component Value Range Comment   Glucose-Capillary 55 (*) 70 - 99 (mg/dL)    Comment 1 Notify RN     MAGNESIUM     Status: Abnormal   Collection Time   11/18/11 11:12 AM      Component Value Range Comment   Magnesium 1.3 (*) 1.5 - 2.5 (mg/dL)   CARDIAC PANEL(CRET KIN+CKTOT+MB+TROPI)     Status: Normal   Collection Time   11/18/11 11:12 AM      Component Value Range Comment   Total CK 134  7 - 232 (U/L)    CK, MB 2.7  0.3 - 4.0 (ng/mL)    Troponin I <0.30  <0.30 (ng/mL)    Relative Index 2.0  0.0 - 2.5    GLUCOSE, CAPILLARY     Status: Abnormal   Collection Time   11/18/11 11:43 AM      Component Value Range Comment   Glucose-Capillary 135 (*) 70 - 99 (mg/dL)    Comment 1 Notify RN     GLUCOSE, CAPILLARY     Status: Abnormal   Collection Time   11/18/11  4:44 PM      Component Value Range Comment   Glucose-Capillary 135 (*) 70 - 99 (mg/dL)    Comment 1 Notify RN       Dg Abd 1 View  11/17/2011  *RADIOLOGY REPORT*  Clinical Data: Abdominal pain, nausea, vomiting.  ABDOMEN - 1 VIEW  Comparison: 02/11/2008  Findings: There is a nonobstructive bowel gas pattern.  No supine evidence of free air.  No organomegaly or suspicious calcification.  No acute bony abnormality.  IMPRESSION: No acute findings.  Original Report Authenticated By: Raelyn Number, M.D.   Nm Gastric Emptying  11/18/2011  *RADIOLOGY REPORT*  Clinical Data:  Nausea and vomiting.  NUCLEAR MEDICINE GASTRIC EMPTYING SCAN  Technique:  After oral ingestion of radiolabeled meal, sequential abdominal images were obtained for 120 minutes.  Residual percentage of activity remaining within the stomach was calculated at 60 and 120 minutes.  Radiopharmaceutical:  2.0 mCi Tc-72msulfur colloid.  Comparison:  None.  Findings: The patient has a 85% retention at 60 minutes.  72% retention at  120 minutes.  IMPRESSION: Abnormal gastric emptying with 72% retention at 2 hours.  Original Report Authenticated By: JLarey Seat M.D.   Dg Chest Portable 1 View  11/17/2011  *RADIOLOGY REPORT*  Clinical Data: Chest pain, weakness, lightheadedness  PORTABLE CHEST - 1 VIEW  Comparison: October 07, 2009  Findings: There is cardiomegaly and pulmonary vascular engorgement without frank edema.  A small right pleural effusion is present. The pacemaker leads are intact and stable projecting over the right atrium and ventricle.  IMPRESSION: Cardiomegaly and pulmonary vascular engorgement without frank edema.  Small right effusion.  Original Report Authenticated By: CDuayne Cal M.D.     Disposition: Home  Diet: Carb modified  Activity: Increase activity slowly   Follow-up Appts: Discharge Orders    Future Appointments: Provider: Department: Dept Phone: Center:   01/05/2012 11:15 AM [phone]     Future Orders Please Complete By Expires   Diet Carb Modified      Increase activity slowly  Discharge instructions      Comments:   Follow up in coumadin clinic on Monday 11/21/11 Follow up with SKAINS, MARK, in 1-2 weeks Follow up with Dr Loney Hering in 1 week.       TESTS THAT NEED FOLLOW-UP  BMET in 1 week, magnesium in one week. 2-D echo  Time spent on discharge, talking to the patient, and coordinating care: 60 mins.   SignedIrine Seal 11/18/2011, 4:53 PM

## 2011-11-18 NOTE — Progress Notes (Signed)
Inpatient Diabetes Program Recommendations  AACE/ADA: New Consensus Statement on Inpatient Glycemic Control (2009)  Target Ranges:  Prepandial:   less than 140 mg/dL      Peak postprandial:   less than 180 mg/dL (1-2 hours)      Critically ill patients:  140 - 180 mg/dL   Reason for Visit: Hyperglycemia and Consult Results for [name], [name] (MRN [id number]) as of 11/18/2011 12:02  Ref. Range 10/08/2009 11:11 11/17/2011 15:17 11/17/2011 16:44 11/17/2011 20:10 11/18/2011 07:54 11/18/2011 11:43  Glucose-Capillary Latest Range: 70-99 mg/dL 92 282 (H) 275 (H) 182 (H) 55 (L) 135 (H)     Inpatient Diabetes Program Recommendations Insulin - Basal: Decrease Lantus to 75 units QHS Insulin - Meal Coverage: Add Novolog 4 units tidwc when diet is advanced and pt eating < 50% meal Outpatient Referral: OP Diabetes Education consult for HgbA1C > 7.  Note: Pt states he goes to [facility] for diabetes management, but doesn't go on a regular basis.  Couldn't tell me last time he was there.  States he has been on Lantus 100 units for a long time.  Checks blood sugars twice per day.  Discussed importance of controlling blood sugars to prevent long-term complications.

## 2011-11-18 NOTE — Progress Notes (Addendum)
Subjective:  "I'm ready to go home". Feels good. No more emesis or CP. No SOB. Laying flat in bed.   Objective:  Vital Signs in the last 24 hours: Temp:  [97.5 F (36.4 C)-98.7 F (37.1 C)] 97.5 F (36.4 C) (03/15 0500) Pulse Rate:  [60-83] 60  (03/15 0820) Resp:  [0-23] 20  (03/15 0500) BP: (113-168)/(61-91) 113/66 mmHg (03/15 1137) SpO2:  [91 %-100 %] 100 % (03/15 0500) Weight:  [84.1 kg (185 lb 6.5 oz)-85.1 kg (187 lb 9.8 oz)] 84.1 kg (185 lb 6.5 oz) (03/15 0500)  Intake/Output from previous day:     Physical Exam: General: Well developed, well nourished, in no acute distress. Head:  Normocephalic and atraumatic. Lungs: Clear to auscultation and percussion. Heart: Normal S1 and S2. Soft S3 No murmur, rubs or gallops.  Pulses: Pulses normal in all 4 extremities. Abdomen: soft, non-tender, positive bowel sounds. Extremities: No clubbing or cyanosis. No edema. Neurologic: Alert and oriented x 3.    Lab Results:  Basename 11/18/11 0530 11/17/11 1751  WBC 9.4 10.2  HGB 13.3 12.9*  PLT 221 224    Basename 11/18/11 0530 11/17/11 1751 11/17/11 1202  NA 140 -- 138  K 3.2* -- 3.8  CL 99 -- 102  CO2 25 -- 27  GLUCOSE 57* -- 287*  BUN 15 -- 15  CREATININE 0.94 0.85 --    Basename 11/18/11 1112 11/17/11 1546  TROPONINI <0.30 <0.30   Hepatic Function Panel  Basename 11/17/11 1202  PROT 7.5  ALBUMIN 3.4*  AST 17  ALT 17  ALKPHOS 108  BILITOT 0.3  BILIDIR --  IBILI --    Basename 11/18/11 0530  CHOL 226*   No results found for this basename: PROTIME in the last 72 hours  Telemetry: NSR, occasional A pace Personally viewed.   Assessment/Plan:   Acute on chronic systolic heart failure  - much improved. IV lasix. May change to home PO dose.   - from my perspective OK to DC.  Nausea  - improved. Likely GI etiology.   DM  - uncontrolled. Needs close follow up with PCP.   CAD  - stable. CE's normal. No signs of MI.   Hypokalemia  - replete. Both  emesis and lasix culprit.    [name], [place] 11/18/2011, 1:29 PM

## 2011-11-18 NOTE — Progress Notes (Signed)
Discharge review done with patient.   Patient acknowledged understanding of information provided.  Prescriptions given per MD's order.  Patient is stable and discharged home with wife. Jorene Guest

## 2011-11-18 NOTE — Progress Notes (Signed)
PT Cancellation Note  Orders for PT eval and treat received. PT eval attempted this morning, however treatment cancelled due to patient off floor for test per lead RN. Will follow up this afternoon as able.  Thanks!  Kendrick Ranch 11/18/2011, 9:29 AM

## 2011-11-18 NOTE — Progress Notes (Signed)
11/18/2011 [facility] [place] Case Management Note [phone]  Utilization review completed.

## 2011-11-18 NOTE — Progress Notes (Signed)
  Echocardiogram 2D Echocardiogram has been performed.  Venezia Sargeant, Orlena Sheldon 11/18/2011, 2:52 PM

## 2011-11-19 LAB — URINE CULTURE: Culture  Setup Time: 201303150934

## 2011-11-20 NOTE — ED Provider Notes (Signed)
Medical screening examination/treatment/procedure(s) were conducted as a shared visit with non-physician practitioner(s) and myself.  I personally evaluated the patient during the encounter.  Patient with known cardiac history in EKG changes. Patient is no acute distress on exam. Initial troponin is negative. Consider pulmonary embolism but doubt. Patient is on Coumadin and does have slightly subtherapeutic INR though. Patient with no severe respiratory complaints. Doubt infectious etiology. Doubt dissection. No evidence of pneumothorax. Case was discussed with cardiology. Admission.  Virgel Manifold, MD 11/20/11 [phone]

## 2012-01-05 ENCOUNTER — Encounter: Payer: Self-pay | Admitting: Internal Medicine

## 2012-01-05 ENCOUNTER — Ambulatory Visit (INDEPENDENT_AMBULATORY_CARE_PROVIDER_SITE_OTHER): Payer: BC Managed Care – PPO | Admitting: *Deleted

## 2012-01-05 DIAGNOSIS — I255 Ischemic cardiomyopathy: Secondary | ICD-10-CM

## 2012-01-05 DIAGNOSIS — I2589 Other forms of chronic ischemic heart disease: Secondary | ICD-10-CM

## 2012-01-05 LAB — REMOTE ICD DEVICE
ATRIAL PACING ICD: 1 pct
BATTERY VOLTAGE: 3.02 V
DEVICE MODEL ICD: 474683
HV IMPEDENCE: 43 Ohm
RV LEAD IMPEDENCE ICD: 360 Ohm
TZAT-0001FASTVT: 1
TZAT-0018FASTVT: NEGATIVE
TZAT-0019FASTVT: 7.5 V
TZAT-0020FASTVT: 1 ms
TZON-0003SLOWVT: 400 ms
TZON-0004FASTVT: 12
TZON-0004SLOWVT: 24
TZON-0005FASTVT: 6
TZON-0005SLOWVT: 6
TZON-0010FASTVT: 80 ms
TZST-0001FASTVT: 4
TZST-0003FASTVT: 30 J
VENTRICULAR PACING ICD: 1 pct

## 2012-01-13 NOTE — Progress Notes (Signed)
ICD remote 

## 2012-01-20 ENCOUNTER — Encounter: Payer: Self-pay | Admitting: *Deleted

## 2012-04-19 ENCOUNTER — Encounter: Payer: Self-pay | Admitting: *Deleted

## 2012-04-26 ENCOUNTER — Ambulatory Visit (INDEPENDENT_AMBULATORY_CARE_PROVIDER_SITE_OTHER): Payer: BC Managed Care – PPO | Admitting: Internal Medicine

## 2012-04-26 ENCOUNTER — Encounter: Payer: Self-pay | Admitting: Internal Medicine

## 2012-04-26 VITALS — BP 148/70 | HR 83 | Ht 69.0 in | Wt 188.4 lb

## 2012-04-26 DIAGNOSIS — I2589 Other forms of chronic ischemic heart disease: Secondary | ICD-10-CM

## 2012-04-26 DIAGNOSIS — R079 Chest pain, unspecified: Secondary | ICD-10-CM | POA: Insufficient documentation

## 2012-04-26 DIAGNOSIS — I498 Other specified cardiac arrhythmias: Secondary | ICD-10-CM

## 2012-04-26 DIAGNOSIS — I471 Supraventricular tachycardia: Secondary | ICD-10-CM | POA: Insufficient documentation

## 2012-04-26 DIAGNOSIS — Z95810 Presence of automatic (implantable) cardiac defibrillator: Secondary | ICD-10-CM

## 2012-04-26 DIAGNOSIS — I5022 Chronic systolic (congestive) heart failure: Secondary | ICD-10-CM

## 2012-04-26 MED ORDER — ATENOLOL 25 MG PO TABS: ORAL_TABLET | ORAL | Status: DC

## 2012-04-26 NOTE — Assessment & Plan Note (Signed)
Heart failure status is stable. I will defer to Dr. Arlyce Dice as to whether he is appropriate candidate for Aldactone

## 2012-04-26 NOTE — Patient Instructions (Addendum)
Remote monitoring is used to monitor your Pacemaker of ICD from home. This monitoring reduces the number of office visits required to check your device to one time per year. It allows [place] to keep an eye on the functioning of your device to ensure it is working properly. You are scheduled for a device check from home on July 30, 2012. You may send your transmission at any time that day. If you have a wireless device, the transmission will be sent automatically. After your physician reviews your transmission, you will receive a postcard with your next transmission date.  Your physician wants you to follow-up in: 1 year with Dr Caryl Comes.  You will receive a reminder letter in the mail two months in advance. If you don't receive a letter, please call our office to schedule the follow-up appointment.  Your physician has recommended you make the following change in your medication:  1) Start atenolol 25 mg one tablet by mouth daily.

## 2012-04-26 NOTE — Assessment & Plan Note (Signed)
The patient has symptomatic supraventricular tachycardia at by electrogram evaluation appears to be sinus. I have reviewed with him the physiology. He notes that the spells occur at rest and he may well represent an atrial tachycardia with a focus of initiation proximate to the sinus node. He previously did not tolerate metoprolol succinate. I will give him adjunctive atenolol the hopes of getting more beta blockade; he will continue on his carvedilol mortality risk reduction associated with his cardiomyopathy

## 2012-04-26 NOTE — Assessment & Plan Note (Signed)
The patient's device was interrogated.  The information was reviewed. No changes were made in the programming.    

## 2012-04-26 NOTE — Progress Notes (Signed)
HPI: [name] is a 46 y.o. male Seen to establish ICD followup. This was implanted 2010 for primary prevention. He was done in the setting of prior MI prior stenting ischemic cardiomyopathy with an ejection fraction of 15-20% and patent stents  He has had no ICD discharges.   He takes warfarin because he has a history of recurrent lower extremity venous thrombosis. No  workup was undertaken for genetic evaluation.  He has the intermittent chest pains that are quite fleeting lasting less than a minute.  He also has complaints of intermittent tachycardia palpitations which has been relatively brief but rather disruptive. Previously he was on metoprolol succinate and did not tolerate it from the side effect profile   Current Outpatient Prescriptions  Medication Sig Dispense Refill  . aspirin 81 MG tablet Take 81 mg by mouth daily.      [name] atorvastatin (LIPITOR) 80 MG tablet Take 80 mg by mouth daily.        . carvedilol (COREG) 25 MG tablet Take 25 mg by mouth 2 (two) times daily with a meal.        . esomeprazole (NEXIUM) 40 MG capsule Take 40 mg by mouth daily.        . furosemide (LASIX) 20 MG tablet Take 60 mg by mouth 2 (two) times daily.      . insulin aspart (NOVOLOG) 100 UNIT/ML injection Inject 10-25 Units into the skin 3 (three) times daily before meals. Sliding scale      . insulin glargine (LANTUS) 100 UNIT/ML injection Inject 100 Units into the skin at bedtime.      . isosorbide mononitrate (IMDUR) 60 MG 24 hr tablet Take 60 mg by mouth daily.        [name] lisinopril (PRINIVIL,ZESTRIL) 20 MG tablet Take 40 mg by mouth daily.       . magnesium oxide (MAG-OX) 400 MG tablet Take 1 tablet (400 mg total) by mouth 2 (two) times daily.  62 tablet  0  . potassium chloride (KLOR-CON) 10 MEQ CR tablet Take 10 mEq by mouth daily.        [name] warfarin (COUMADIN) 7.5 MG tablet Take 7.5 mg by mouth daily.          No Known Allergies  Past Medical History  Diagnosis Date  . Coronary  artery disease      Widely patent previously placed stents in the left anterior   . Type II or unspecified type diabetes mellitus without mention of complication, not stated as uncontrolled   . Ischemic cardiomyopathy     Ejection fraction 15-20% catheterization 2010  . Hypertension, essential   . Automatic implantable cardiac defibrillator in situ     [facility]  . Benign neoplasm of colon   . Unspecified gastritis and gastroduodenitis without mention of hemorrhage   . 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 Hypersomnolent     Previous diagnosis of narcolepsy  . Crohn's disease   . High cholesterol   . CHF (congestive heart failure)   . Myocardial infarction 2006  . Angina   . GERD (gastroesophageal reflux disease)     Past Surgical History  Procedure Date  . Appendectomy   . Fetal surgery for congenital hernia   . Colon surgery   . Colectomy     "for Crohn's"  . Cardiac defibrillator placement ?2006    No family history on file.  History   Social History  . Marital Status: Married  Spouse Name: N/A    Number of Children: N/A  . Years of Education: N/A   Occupational History  . Not on file.   Social History Main Topics  . Smoking status: Never Smoker   . Smokeless tobacco: Never Used  . Alcohol Use: No  . Drug Use: No  . Sexually Active: No   Other Topics Concern  . Not on file   Social History Narrative  . No narrative on file    Fourteen point review of systems was negative except as noted in HPI and PMH   PHYSICAL EXAMINATION  Blood pressure 148/70, pulse 83, height 5' 9"  (1.753 m), weight 188 lb 6.4 oz (85.458 kg).   Well developed and nourished middle-aged [place] American male appearing his stated age in no acute distress HENT normal Neck supple with JVP-flat Device pocket well healed; without hematoma or erythema This  Clear Regular rate and rhythm, displaced PMI; no S3, 2/6 systolic murmur Abd-soft with active BS without  hepatomegaly or midline pulsation Femoral pulses 2+ distal pulses intact No Clubbing cyanosis edema Skin-warm and dry  A & Oriented CN 3-12 normal  Grossly normal sensory and motor function   . Electrocardiogram today demonstrated sinus rhythm at 80 intervals 17/09/38 is evidence of prior anterior wall I   \ Will a

## 2012-05-08 LAB — ICD DEVICE OBSERVATION
AL IMPEDENCE ICD: 410 Ohm
AL THRESHOLD: 0.75 V
ATRIAL PACING ICD: 1 pct
BAMS-0001: 180 {beats}/min
BAMS-0003: 70 {beats}/min
DEV-0020ICD: NEGATIVE
HV IMPEDENCE: 44 Ohm
RV LEAD IMPEDENCE ICD: 400 Ohm
RV LEAD THRESHOLD: 0.75 V
TZAT-0004FASTVT: 8
TZAT-0012FASTVT: 200 ms
TZAT-0013FASTVT: 2
TZAT-0018FASTVT: NEGATIVE
TZON-0003FASTVT: 350 ms
TZON-0005SLOWVT: 6
TZST-0001FASTVT: 2
TZST-0001FASTVT: 3
TZST-0001FASTVT: 5
TZST-0003FASTVT: 36 J
TZST-0003FASTVT: 36 J
TZST-0003FASTVT: 36 J

## 2012-05-24 ENCOUNTER — Encounter: Payer: Self-pay | Admitting: Internal Medicine

## 2012-06-25 ENCOUNTER — Encounter: Payer: Self-pay | Admitting: Internal Medicine

## 2012-06-27 ENCOUNTER — Encounter: Payer: Self-pay | Admitting: Internal Medicine

## 2012-07-01 ENCOUNTER — Emergency Department (HOSPITAL_COMMUNITY)
Admission: EM | Admit: 2012-07-01 | Discharge: 2012-07-01 | Disposition: A | Discharge status: Home / Self Care | Payer: BC Managed Care – PPO | Attending: Emergency Medicine | Admitting: Emergency Medicine

## 2012-07-01 ENCOUNTER — Encounter (HOSPITAL_COMMUNITY): Payer: Self-pay | Admitting: Emergency Medicine

## 2012-07-01 ENCOUNTER — Emergency Department (HOSPITAL_COMMUNITY): Payer: BC Managed Care – PPO

## 2012-07-01 DIAGNOSIS — Z794 Long term (current) use of insulin: Secondary | ICD-10-CM | POA: Insufficient documentation

## 2012-07-01 DIAGNOSIS — I82409 Acute embolism and thrombosis of unspecified deep veins of unspecified lower extremity: Secondary | ICD-10-CM | POA: Insufficient documentation

## 2012-07-01 DIAGNOSIS — Z79899 Other long term (current) drug therapy: Secondary | ICD-10-CM | POA: Insufficient documentation

## 2012-07-01 DIAGNOSIS — Z7901 Long term (current) use of anticoagulants: Secondary | ICD-10-CM | POA: Insufficient documentation

## 2012-07-01 DIAGNOSIS — E785 Hyperlipidemia, unspecified: Secondary | ICD-10-CM | POA: Insufficient documentation

## 2012-07-01 DIAGNOSIS — Z7982 Long term (current) use of aspirin: Secondary | ICD-10-CM | POA: Insufficient documentation

## 2012-07-01 DIAGNOSIS — I2510 Atherosclerotic heart disease of native coronary artery without angina pectoris: Secondary | ICD-10-CM | POA: Insufficient documentation

## 2012-07-01 DIAGNOSIS — R0602 Shortness of breath: Secondary | ICD-10-CM | POA: Insufficient documentation

## 2012-07-01 DIAGNOSIS — I509 Heart failure, unspecified: Secondary | ICD-10-CM | POA: Insufficient documentation

## 2012-07-01 DIAGNOSIS — I252 Old myocardial infarction: Secondary | ICD-10-CM | POA: Insufficient documentation

## 2012-07-01 DIAGNOSIS — I428 Other cardiomyopathies: Secondary | ICD-10-CM | POA: Insufficient documentation

## 2012-07-01 DIAGNOSIS — Z8719 Personal history of other diseases of the digestive system: Secondary | ICD-10-CM | POA: Insufficient documentation

## 2012-07-01 DIAGNOSIS — K219 Gastro-esophageal reflux disease without esophagitis: Secondary | ICD-10-CM | POA: Insufficient documentation

## 2012-07-01 DIAGNOSIS — I10 Essential (primary) hypertension: Secondary | ICD-10-CM | POA: Insufficient documentation

## 2012-07-01 DIAGNOSIS — Z8659 Personal history of other mental and behavioral disorders: Secondary | ICD-10-CM | POA: Insufficient documentation

## 2012-07-01 DIAGNOSIS — E119 Type 2 diabetes mellitus without complications: Secondary | ICD-10-CM | POA: Insufficient documentation

## 2012-07-01 DIAGNOSIS — K5090 Crohn's disease, unspecified, without complications: Secondary | ICD-10-CM | POA: Insufficient documentation

## 2012-07-01 LAB — CBC
Hemoglobin: 13.7 g/dL (ref 13.0–17.0)
MCV: 83.5 fL (ref 78.0–100.0)
Platelets: 249 10*3/uL (ref 150–400)
RBC: 4.84 MIL/uL (ref 4.22–5.81)
WBC: 10.6 10*3/uL — ABNORMAL HIGH (ref 4.0–10.5)

## 2012-07-01 LAB — HEPATIC FUNCTION PANEL
Alkaline Phosphatase: 105 U/L (ref 39–117)
Total Protein: 7.5 g/dL (ref 6.0–8.3)

## 2012-07-01 LAB — BASIC METABOLIC PANEL
CO2: 27 mEq/L (ref 19–32)
Chloride: 104 mEq/L (ref 96–112)
Glucose, Bld: 90 mg/dL (ref 70–99)
Sodium: 141 mEq/L (ref 135–145)

## 2012-07-01 LAB — GLUCOSE, CAPILLARY: Glucose-Capillary: 83 mg/dL (ref 70–99)

## 2012-07-01 LAB — POCT I-STAT TROPONIN I: Troponin i, poc: 0 ng/mL (ref 0.00–0.08)

## 2012-07-01 LAB — PRO B NATRIURETIC PEPTIDE: Pro B Natriuretic peptide (BNP): 538.8 pg/mL — ABNORMAL HIGH (ref 0–125)

## 2012-07-01 MED ORDER — METOCLOPRAMIDE HCL 5 MG/ML IJ SOLN
10.0000 mg | Freq: Once | INTRAMUSCULAR | Status: AC
  Administered 2012-07-01: 10 mg via INTRAVENOUS
  Filled 2012-07-01: qty 2

## 2012-07-01 MED ORDER — ONDANSETRON 4 MG PO TBDP
8.0000 mg | ORAL_TABLET | Freq: Once | ORAL | Status: AC
  Administered 2012-07-01: 8 mg via ORAL
  Filled 2012-07-01: qty 2

## 2012-07-01 MED ORDER — ONDANSETRON HCL 4 MG/2ML IJ SOLN
4.0000 mg | Freq: Once | INTRAMUSCULAR | Status: AC
  Administered 2012-07-01: 4 mg via INTRAVENOUS
  Filled 2012-07-01: qty 2

## 2012-07-01 NOTE — ED Notes (Signed)
Pt started to actively vomit in triage

## 2012-07-01 NOTE — ED Notes (Signed)
Reports having exhaustion and SOB--since this AM, spoke with Dr Radford Pax with [facility] and was sent here; denies CP; reports nausea

## 2012-07-01 NOTE — ED Provider Notes (Signed)
History     CSN: [id number]  Arrival date & time 07/01/12  1457   First MD Initiated Contact with Patient 07/01/12 1854      Chief Complaint  Patient presents with  . Shortness of Breath    (Consider location/radiation/quality/duration/timing/severity/associated sxs/prior treatment) Patient is a 46 y.o. male presenting with shortness of breath. The history is provided by the patient.  Shortness of Breath  The current episode started today. The onset was gradual. The problem occurs continuously. The problem has been unchanged. The problem is moderate. Associated symptoms include shortness of breath. Pertinent negatives include no chest pain. His past medical history does not include asthma or past wheezing. Urine output has been normal. The last void occurred less than 6 hours ago. There were no sick contacts.    Past Medical History  Diagnosis Date  . Coronary artery disease      Widely patent previously placed stents in the left anterior   . Type II or unspecified type diabetes mellitus without mention of complication, not stated as uncontrolled   . Ischemic cardiomyopathy     Ejection fraction 15-20% catheterization 2010  . Hypertension, essential   . Automatic implantable cardiac defibrillator in situ     [facility]  . Benign neoplasm of colon   . Unspecified gastritis and gastroduodenitis without mention of hemorrhage   . 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 Hypersomnolent     Previous diagnosis of narcolepsy  . Crohn's disease   . High cholesterol   . CHF (congestive heart failure)   . Myocardial infarction 2006  . Angina   . GERD (gastroesophageal reflux disease)     Past Surgical History  Procedure Date  . Appendectomy   . Fetal surgery for congenital hernia   . Colon surgery   . Colectomy     "for Crohn's"  . Cardiac defibrillator placement ?2006    History reviewed. No pertinent family history.  History  Substance Use Topics  . Smoking  status: Never Smoker   . Smokeless tobacco: Never Used  . Alcohol Use: No      Review of Systems  Respiratory: Positive for shortness of breath.   Cardiovascular: Negative for chest pain.  All other systems reviewed and are negative.    Allergies  Review of patient's allergies indicates no known allergies.  Home Medications   Current Outpatient Rx  Name Route Sig Dispense Refill  . ATENOLOL 25 MG PO TABS Oral Take 25 mg by mouth daily.    . ASPIRIN 81 MG PO TABS Oral Take 81 mg by mouth daily.    . ATORVASTATIN CALCIUM 80 MG PO TABS Oral Take 80 mg by mouth daily.      [name] CARVEDILOL 25 MG PO TABS Oral Take 25 mg by mouth 2 (two) times daily with a meal.      . ESOMEPRAZOLE MAGNESIUM 40 MG PO CPDR Oral Take 40 mg by mouth daily.      . FUROSEMIDE 20 MG PO TABS Oral Take 60 mg by mouth 2 (two) times daily.    . INSULIN ASPART 100 UNIT/ML [place] SOLN Subcutaneous Inject 10-25 Units into the skin 3 (three) times daily before meals. Sliding scale    . INSULIN GLARGINE 100 UNIT/ML [place] SOLN Subcutaneous Inject 100 Units into the skin at bedtime.    . ISOSORBIDE MONONITRATE ER 60 MG PO TB24 Oral Take 60 mg by mouth daily.      [name] LISINOPRIL 20  MG PO TABS Oral Take 40 mg by mouth daily.     [name] MAGNESIUM OXIDE 400 MG PO TABS Oral Take 1 tablet (400 mg total) by mouth 2 (two) times daily. 62 tablet 0  . POTASSIUM CHLORIDE 10 MEQ PO TBCR Oral Take 10 mEq by mouth daily.      . WARFARIN SODIUM 7.5 MG PO TABS Oral Take 7.5 mg by mouth daily.        BP 160/81  Pulse 94  Temp 98.2 F (36.8 C) (Oral)  Resp 22  SpO2 97%  Physical Exam Nursing note and vitals reviewed.  Constitutional: Pt is alert and appears stated age. Oropharynx: Airway open without erythema or exudate. Respiratory: No respiratory distress. Equal breathing bilaterally. CV: Extremities warm and well perfused. Neuro: No motor nor sensory deficit. Head: Normocephalic and atraumatic. Eyes: No conjunctivitis, no scleral  icterus. Neck: Supple, no mass. Chest: Non-tender. Abdomen: Soft, non-tender MSK: Extremities are atraumatic without deformity. Skin: No rash, no wounds.  ED Course  Procedures (including critical care time)  Labs Reviewed  CBC - Abnormal; Notable for the following:    WBC 10.6 (*)     All other components within normal limits  BASIC METABOLIC PANEL - Abnormal; Notable for the following:    BUN 26 (*)     GFR calc non Af Amer 88 (*)     All other components within normal limits  PRO B NATRIURETIC PEPTIDE - Abnormal; Notable for the following:    Pro B Natriuretic peptide (BNP) 538.8 (*)     All other components within normal limits  GLUCOSE, CAPILLARY  POCT I-STAT TROPONIN I   Dg Chest 2 View  07/01/2012  *RADIOLOGY REPORT*  Clinical Data: Short of breath  CHEST - 2 VIEW  Comparison: Chest radiograph 11/17/2011  Findings: Left-sided defibrillator is unchanged.  Normal cardiac silhouette.  No effusion, infiltrate, or pneumothorax.  IMPRESSION: No acute cardiopulmonary process.   Original Report Authenticated By: Suzy Bouchard, M.D.      1. Shortness of breath       MDM  46 y.o. male here with shortness of breath. Denies any chest pain. Pertinent past problems include ICM with EF of 15-20%. Since pt denies chest pain am not concerned for PE. Pt looks well. Plan for work up and discussion with cardiology.   Medications/interventions:  zofran for nausea.  Data reviewed: EKG ordered and interpreted by me: NSR, no axis deviation, no QRS widening, nonspecific ST-T wave changes and no significant changes from prior EKG.  Lab tests ordered and reviewed by me: troponin low. CBC, CMP unremarkable. BNP at 538. . I independently viewed the following imaging studies and reviewed radiology's interpretation as summarized: CXR with no acute disease.  Course of care: Pt ambulated on monitor and able to do quite well according to the nurse walking around the halls of the ED without  complaining of SOB. No desaturations. HR held steady per report. Called cardiologist on call and discussed pt with Dr. Claiborne Billings. Feel pt would be stable for close follow up. States he will be able to arrange this. Discussing this with the patient this is what he would prefer. Strict return precautions given. Counseling provided regarding diagnosis, treatment plan, follow up recommendations, and return precautions. Questions answered.   Medical Decision Making discussed with ED attending Varney Biles, MD          Dayton Scrape, MD 07/01/12 [phone]

## 2012-07-02 NOTE — ED Provider Notes (Signed)
I saw and evaluated the patient, reviewed the resident's note and I agree with the findings and plan.  Pt comes in with cc of sob. He has advanced CHF. Exam not showing florid fluid overload, and CXR is fine as well. He had some nausea, emesis - and LFTs ordered are normal. No evidence of decompensated failure, Cardiology not overly impressed either, and plan was made to d/c with Cards followup.  Varney Biles, MD 07/02/12 [phone]

## 2012-07-25 ENCOUNTER — Encounter: Payer: BC Managed Care – PPO | Admitting: Internal Medicine

## 2012-07-26 ENCOUNTER — Encounter: Payer: Self-pay | Admitting: Internal Medicine

## 2012-07-30 ENCOUNTER — Ambulatory Visit (INDEPENDENT_AMBULATORY_CARE_PROVIDER_SITE_OTHER): Payer: BC Managed Care – PPO | Admitting: *Deleted

## 2012-07-30 ENCOUNTER — Encounter: Payer: Self-pay | Admitting: Internal Medicine

## 2012-07-30 DIAGNOSIS — I255 Ischemic cardiomyopathy: Secondary | ICD-10-CM

## 2012-07-30 DIAGNOSIS — I2589 Other forms of chronic ischemic heart disease: Secondary | ICD-10-CM

## 2012-07-30 DIAGNOSIS — Z95810 Presence of automatic (implantable) cardiac defibrillator: Secondary | ICD-10-CM

## 2012-07-30 LAB — REMOTE ICD DEVICE
AL IMPEDENCE ICD: 440 Ohm
BATTERY VOLTAGE: 2.84 V
DEV-0020ICD: NEGATIVE
HV IMPEDENCE: 46 Ohm
RV LEAD AMPLITUDE: 12 mv
TZAT-0018FASTVT: NEGATIVE
TZAT-0019FASTVT: 7.5 V
TZAT-0020FASTVT: 1 ms
TZON-0003FASTVT: 350 ms
TZON-0003SLOWVT: 400 ms
TZON-0004FASTVT: 12
TZON-0004SLOWVT: 24
TZON-0005FASTVT: 6
TZON-0010FASTVT: 80 ms
TZON-0010SLOWVT: 80 ms
TZST-0001FASTVT: 2
TZST-0001FASTVT: 4
TZST-0003FASTVT: 36 J
TZST-0003FASTVT: 36 J
VENTRICULAR PACING ICD: 1 pct

## 2012-08-09 ENCOUNTER — Encounter: Payer: Self-pay | Admitting: Internal Medicine

## 2012-08-09 ENCOUNTER — Ambulatory Visit (INDEPENDENT_AMBULATORY_CARE_PROVIDER_SITE_OTHER): Payer: BC Managed Care – PPO | Admitting: Internal Medicine

## 2012-08-09 VITALS — BP 133/76 | HR 99 | Resp 18 | Ht 69.0 in | Wt 184.6 lb

## 2012-08-09 DIAGNOSIS — Z95810 Presence of automatic (implantable) cardiac defibrillator: Secondary | ICD-10-CM

## 2012-08-09 DIAGNOSIS — I471 Supraventricular tachycardia: Secondary | ICD-10-CM

## 2012-08-09 DIAGNOSIS — I255 Ischemic cardiomyopathy: Secondary | ICD-10-CM

## 2012-08-09 DIAGNOSIS — I2589 Other forms of chronic ischemic heart disease: Secondary | ICD-10-CM

## 2012-08-09 DIAGNOSIS — I5022 Chronic systolic (congestive) heart failure: Secondary | ICD-10-CM

## 2012-08-09 DIAGNOSIS — I498 Other specified cardiac arrhythmias: Secondary | ICD-10-CM

## 2012-08-09 LAB — ICD DEVICE OBSERVATION
AL IMPEDENCE ICD: 437.5 Ohm
AL THRESHOLD: 0.5 V
ATRIAL PACING ICD: 0.65 pct
BAMS-0003: 70 {beats}/min
BATTERY VOLTAGE: 2.7974 V
CHARGE TIME: 12 s
DEV-0020ICD: NEGATIVE
HV IMPEDENCE: 49 Ohm
MODE SWITCH EPISODES: 0
RV LEAD THRESHOLD: 0.75 V
TOT-0006: 20100430000000
TOT-0007: 2
TOT-0010: 18
TZAT-0004FASTVT: 8
TZAT-0012FASTVT: 200 ms
TZAT-0013FASTVT: 2
TZAT-0018FASTVT: NEGATIVE
TZON-0003FASTVT: 350 ms
TZON-0003SLOWVT: 400 ms
TZST-0001FASTVT: 2
TZST-0001FASTVT: 3
TZST-0001FASTVT: 5
TZST-0003FASTVT: 36 J
TZST-0003FASTVT: 36 J
TZST-0003FASTVT: 36 J

## 2012-08-09 LAB — BASIC METABOLIC PANEL
BUN: 48 mg/dL — ABNORMAL HIGH (ref 6–23)
Chloride: 98 mEq/L (ref 96–112)
Creatinine, Ser: 1.5 mg/dL (ref 0.4–1.5)
Glucose, Bld: 276 mg/dL — ABNORMAL HIGH (ref 70–99)
Potassium: 4.4 mEq/L (ref 3.5–5.1)

## 2012-08-09 LAB — CBC WITH DIFFERENTIAL/PLATELET
Basophils Relative: 0 % (ref 0.0–3.0)
Eosinophils Relative: 0.9 % (ref 0.0–5.0)
Hemoglobin: 15 g/dL (ref 13.0–17.0)
Lymphocytes Relative: 24.4 % (ref 12.0–46.0)
MCHC: 33 g/dL (ref 30.0–36.0)
MCV: 85.9 fl (ref 78.0–100.0)
Monocytes Absolute: 0.4 10*3/uL (ref 0.1–1.0)
Neutro Abs: 6.1 10*3/uL (ref 1.4–7.7)
Neutrophils Relative %: 69.7 % (ref 43.0–77.0)
RBC: 5.31 Mil/uL (ref 4.22–5.81)
WBC: 8.7 10*3/uL (ref 4.5–10.5)

## 2012-08-09 MED ORDER — METOPROLOL SUCCINATE ER 50 MG PO TB24: 50.0000 mg | ORAL_TABLET | Freq: Every day | ORAL | Status: DC

## 2012-08-09 MED ORDER — ATORVASTATIN CALCIUM 80 MG PO TABS: 80.0000 mg | ORAL_TABLET | ORAL | Status: DC

## 2012-08-09 NOTE — Assessment & Plan Note (Signed)
His device was reprogrammed today to increase NID  to try to avoid inappropriate detection therapy

## 2012-08-09 NOTE — Progress Notes (Signed)
Patient Care Team: Derinda Late, MD as PCP - General (Family Medicine)   HPI  [name] is a 46 y.o. male Seen  In  followup for an ICD implanted 2010 for primary prevention. This was done in the setting of prior MI, prior stenting and ischemic cardiomyopathy with an ejection fraction of 15-20% and patent stents . It was deferred to Dr. Marlou Porch to consider the addition of Aldactone  He was also noted to have symptomatic SVT of the sinus based on electrogram morphology and atenolol was added to his carvedilol. He has previously not tolerated atenolol because of fatigue; fatigue has reemerged. He was also noted to be a remote interrogation to that inappropriate therapy for sinus tachycardia after the timeout discriminator   was exceeded    He takes warfarin because he has a history of recurrent lower extremity venous thrombosis. No workup was undertaken for genetic evaluation.    He is also complaining of myalgias  Past Medical History  Diagnosis Date  . Coronary artery disease      Widely patent previously placed stents in the left anterior   . Type II or unspecified type diabetes mellitus without mention of complication, not stated as uncontrolled   . Ischemic cardiomyopathy     Ejection fraction 15-20% catheterization 2010  . Hypertension, essential   . Automatic implantable cardiac defibrillator in situ     [facility]  . Benign neoplasm of colon   . Unspecified gastritis and gastroduodenitis without mention of hemorrhage   . 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 Hypersomnolent     Previous diagnosis of narcolepsy  . Crohn's disease   . High cholesterol   . CHF (congestive heart failure)   . Myocardial infarction 2006  . Angina   . GERD (gastroesophageal reflux disease)     Past Surgical History  Procedure Date  . Appendectomy   . Fetal surgery for congenital hernia   . Colon surgery   . Colectomy     "for Crohn's"  . Cardiac defibrillator  placement ?2006    Current Outpatient Prescriptions  Medication Sig Dispense Refill  . albuterol (PROVENTIL) (5 MG/ML) 0.5% nebulizer solution Take 2.5 mg by nebulization every 6 (six) hours as needed.      . chlorhexidine (PERIDEX) 0.12 % solution Use as directed 15 mLs in the mouth or throat 2 (two) times daily.      [name] enoxaparin (LOVENOX) 40 MG/0.4ML injection Inject 40 mg into the skin daily.      [name] gabapentin (NEURONTIN) 250 MG/5ML solution Take 100 mg by mouth 3 (three) times daily. 100 mg = 2 ml.      . methocarbamol (ROBAXIN) 500 MG tablet Take 500 mg by mouth 4 (four) times daily.      [name] moxifloxacin (AVELOX) 400 MG tablet Take 400 mg by mouth daily.      [name] oxyCODONE (ROXICODONE INTENSOL) 20 MG/ML concentrated solution 10 mg by PEG Tube route every 4 (four) hours as needed.      [name] scopolamine (TRANSDERM-SCOP) 1.5 MG Place 1 patch onto the skin every 3 (three) days.        No Known Allergies  Review of Systems negative except from HPI and PMH  Physical Exam BP 133/76  Pulse 99  Resp 18  Ht 5' 9"  (1.753 m)  Wt 184 lb 9.6 oz (83.734 kg)  BMI 27.26 kg/m2  SpO2 98% Well developed and well nourished in no acute distress HENT  normal E scleral and icterus clear Neck Supple JVP flat; carotids brisk and full Clear to ausculation  Regular rate and rhythm, no murmurs gallops or rub Soft with active bowel sounds No clubbing cyanosis none Edema Alert and oriented, grossly normal motor and sensory function Skin Warm and Dry    Assessment and  Plan

## 2012-08-09 NOTE — Assessment & Plan Note (Signed)
I am not entirely convinced that and largely convinced that this represents sinus tachycardia. It is not clear to me why is that his heart rates are so fast. We will look for underlying primary causes and adjust his beta blockers.

## 2012-08-09 NOTE — Patient Instructions (Addendum)
Remote monitoring is used to monitor your Pacemaker of ICD from home. This monitoring reduces the number of office visits required to check your device to one time per year. It allows [place] to keep an eye on the functioning of your device to ensure it is working properly. You are scheduled for a device check from home on 11/05/2012. You may send your transmission at any time that day. If you have a wireless device, the transmission will be sent automatically. After your physician reviews your transmission, you will receive a postcard with your next transmission date.  Stop Atenolol.  Start Metoprolol 30m as directed.  LABS TODAY:  TSH, BMET CBC

## 2012-08-09 NOTE — Assessment & Plan Note (Addendum)
He is having increasing symptoms of fatigue. His heart rates are right shifted to the 20% of his heartbeat or faster than 100 beats per minute. We have tried atenolol last time. We'll try metoprolol succinate as there is significant fatigue which may be drug-related. In addition we will check a CBC and TSH to look for primary causes.  He is euvolemic

## 2012-08-09 NOTE — Assessment & Plan Note (Signed)
As above.

## 2012-08-20 ENCOUNTER — Other Ambulatory Visit: Payer: Self-pay | Admitting: *Deleted

## 2012-08-20 DIAGNOSIS — I10 Essential (primary) hypertension: Secondary | ICD-10-CM

## 2012-08-20 NOTE — Progress Notes (Signed)
Attempted to contact pt again at phone numbers in chart, still not working.

## 2012-11-05 ENCOUNTER — Ambulatory Visit (INDEPENDENT_AMBULATORY_CARE_PROVIDER_SITE_OTHER): Payer: BC Managed Care – PPO | Admitting: *Deleted

## 2012-11-05 ENCOUNTER — Encounter: Payer: Self-pay | Admitting: Internal Medicine

## 2012-11-05 ENCOUNTER — Other Ambulatory Visit: Payer: Self-pay

## 2012-11-05 DIAGNOSIS — Z95810 Presence of automatic (implantable) cardiac defibrillator: Secondary | ICD-10-CM

## 2012-11-05 DIAGNOSIS — I2589 Other forms of chronic ischemic heart disease: Secondary | ICD-10-CM

## 2012-11-05 DIAGNOSIS — I255 Ischemic cardiomyopathy: Secondary | ICD-10-CM

## 2012-11-05 LAB — REMOTE ICD DEVICE
AL IMPEDENCE ICD: 360 Ohm
BAMS-0001: 180 {beats}/min
BAMS-0003: 70 {beats}/min
DEV-0020ICD: NEGATIVE
RV LEAD AMPLITUDE: 11.5 mv
RV LEAD IMPEDENCE ICD: 360 Ohm
TZAT-0004FASTVT: 8
TZAT-0012FASTVT: 200 ms
TZAT-0013FASTVT: 2
TZAT-0018FASTVT: NEGATIVE
TZON-0003FASTVT: 300 ms
TZON-0003SLOWVT: 400 ms
TZON-0010SLOWVT: 80 ms
TZST-0001FASTVT: 3
TZST-0003FASTVT: 36 J
TZST-0003FASTVT: 36 J

## 2012-11-16 ENCOUNTER — Encounter: Payer: Self-pay | Admitting: *Deleted

## 2013-02-04 ENCOUNTER — Ambulatory Visit (INDEPENDENT_AMBULATORY_CARE_PROVIDER_SITE_OTHER): Payer: BC Managed Care – PPO | Admitting: *Deleted

## 2013-02-04 ENCOUNTER — Encounter: Payer: Self-pay | Admitting: Internal Medicine

## 2013-02-04 DIAGNOSIS — I255 Ischemic cardiomyopathy: Secondary | ICD-10-CM

## 2013-02-04 DIAGNOSIS — Z95810 Presence of automatic (implantable) cardiac defibrillator: Secondary | ICD-10-CM

## 2013-02-04 DIAGNOSIS — I2589 Other forms of chronic ischemic heart disease: Secondary | ICD-10-CM

## 2013-02-04 LAB — REMOTE ICD DEVICE
AL AMPLITUDE: 5 mv
BATTERY VOLTAGE: 2.63 V
DEV-0020ICD: NEGATIVE
RV LEAD AMPLITUDE: 11.7 mv
TZON-0003SLOWVT: 400 ms
TZON-0010FASTVT: 80 ms
TZON-0010SLOWVT: 80 ms
TZST-0001FASTVT: 2
TZST-0001FASTVT: 5
TZST-0003FASTVT: 36 J
VENTRICULAR PACING ICD: 1 pct

## 2013-02-25 ENCOUNTER — Encounter: Payer: Self-pay | Admitting: *Deleted

## 2013-05-07 ENCOUNTER — Ambulatory Visit (INDEPENDENT_AMBULATORY_CARE_PROVIDER_SITE_OTHER): Payer: BC Managed Care – PPO | Admitting: *Deleted

## 2013-05-07 DIAGNOSIS — Z95810 Presence of automatic (implantable) cardiac defibrillator: Secondary | ICD-10-CM

## 2013-05-07 DIAGNOSIS — I255 Ischemic cardiomyopathy: Secondary | ICD-10-CM

## 2013-05-07 DIAGNOSIS — I2589 Other forms of chronic ischemic heart disease: Secondary | ICD-10-CM

## 2013-05-08 LAB — REMOTE ICD DEVICE
AL AMPLITUDE: 5 mv
BATTERY VOLTAGE: 2.6 V
DEVICE MODEL ICD: 474683
HV IMPEDENCE: 42 Ohm
RV LEAD IMPEDENCE ICD: 400 Ohm
TZAT-0013FASTVT: 2
TZAT-0018FASTVT: NEGATIVE
TZAT-0019FASTVT: 7.5 V
TZAT-0020FASTVT: 1 ms
TZON-0003SLOWVT: 400 ms
TZON-0004FASTVT: 24
TZON-0005FASTVT: 6
TZON-0005SLOWVT: 6
TZST-0001FASTVT: 2
TZST-0001FASTVT: 4
TZST-0003FASTVT: 36 J
TZST-0003FASTVT: 36 J

## 2013-05-17 NOTE — Progress Notes (Signed)
ICD remote. All functions normal, no NSVT. Full details in [place].  ROV w/ Dr. Caryl Comes 08/2014

## 2013-05-28 ENCOUNTER — Encounter: Payer: Self-pay | Admitting: *Deleted

## 2013-06-03 ENCOUNTER — Encounter: Payer: Self-pay | Admitting: Cardiology

## 2013-06-05 ENCOUNTER — Encounter: Payer: Self-pay | Admitting: Internal Medicine

## 2013-06-11 ENCOUNTER — Ambulatory Visit: Payer: BC Managed Care – PPO | Admitting: Cardiology

## 2013-09-27 ENCOUNTER — Telehealth: Payer: Self-pay | Admitting: Cardiology

## 2013-09-27 ENCOUNTER — Other Ambulatory Visit: Payer: Self-pay | Admitting: Cardiology

## 2013-09-27 MED ORDER — FUROSEMIDE 80 MG PO TABS: 80.0000 mg | ORAL_TABLET | Freq: Two times a day (BID) | ORAL | Status: DC

## 2013-09-27 NOTE — Telephone Encounter (Signed)
Rx filled

## 2013-09-27 NOTE — Telephone Encounter (Signed)
New messsage    refill furosemide----[facility]----pt has been out of medication for 3 days---pt has not seen dr Marlou Porch in new office

## 2013-09-27 NOTE — Telephone Encounter (Signed)
Rx refill

## 2013-12-09 ENCOUNTER — Encounter: Payer: Self-pay | Admitting: *Deleted

## 2014-01-06 ENCOUNTER — Ambulatory Visit (INDEPENDENT_AMBULATORY_CARE_PROVIDER_SITE_OTHER): Payer: BC Managed Care – PPO | Admitting: Cardiology

## 2014-01-06 ENCOUNTER — Encounter: Payer: Self-pay | Admitting: Cardiology

## 2014-01-06 ENCOUNTER — Encounter (INDEPENDENT_AMBULATORY_CARE_PROVIDER_SITE_OTHER): Payer: Self-pay

## 2014-01-06 VITALS — BP 142/90 | HR 80 | Ht 69.0 in | Wt 189.0 lb

## 2014-01-06 DIAGNOSIS — Z95810 Presence of automatic (implantable) cardiac defibrillator: Secondary | ICD-10-CM

## 2014-01-06 DIAGNOSIS — E785 Hyperlipidemia, unspecified: Secondary | ICD-10-CM

## 2014-01-06 DIAGNOSIS — I5022 Chronic systolic (congestive) heart failure: Secondary | ICD-10-CM

## 2014-01-06 DIAGNOSIS — I255 Ischemic cardiomyopathy: Secondary | ICD-10-CM

## 2014-01-06 DIAGNOSIS — I2510 Atherosclerotic heart disease of native coronary artery without angina pectoris: Secondary | ICD-10-CM

## 2014-01-06 DIAGNOSIS — I2589 Other forms of chronic ischemic heart disease: Secondary | ICD-10-CM

## 2014-01-06 LAB — BASIC METABOLIC PANEL
BUN: 21 mg/dL (ref 6–23)
CALCIUM: 8.9 mg/dL (ref 8.4–10.5)
CHLORIDE: 99 meq/L (ref 96–112)
CO2: 31 meq/L (ref 19–32)
Creatinine, Ser: 1.3 mg/dL (ref 0.4–1.5)
GFR: 74.06 mL/min (ref >60.00)
Glucose, Bld: 75 mg/dL (ref 70–99)
POTASSIUM: 3.3 meq/L — AB (ref 3.5–5.1)
SODIUM: 138 meq/L (ref 135–145)

## 2014-01-06 LAB — CBC WITH DIFFERENTIAL/PLATELET
BASOS PCT: 0.4 % (ref 0.0–3.0)
Basophils Absolute: 0 10*3/uL (ref 0.0–0.1)
EOS ABS: 0.1 10*3/uL (ref 0.0–0.7)
Eosinophils Relative: 1.6 % (ref 0.0–5.0)
HCT: 38.6 % — ABNORMAL LOW (ref 39.0–52.0)
HEMOGLOBIN: 12.5 g/dL — AB (ref 13.0–17.0)
LYMPHS PCT: 27.5 % (ref 12.0–46.0)
Lymphs Abs: 2.2 10*3/uL (ref 0.7–4.0)
MCHC: 32.4 g/dL (ref 30.0–36.0)
MCV: 83 fl (ref 78.0–100.0)
Monocytes Absolute: 0.5 10*3/uL (ref 0.1–1.0)
Monocytes Relative: 6.2 % (ref 3.0–12.0)
NEUTROS ABS: 5.2 10*3/uL (ref 1.4–7.7)
Neutrophils Relative %: 64.3 % (ref 43.0–77.0)
Platelets: 280 10*3/uL (ref 150.0–400.0)
RBC: 4.65 Mil/uL (ref 4.22–5.81)
RDW: 14.6 % (ref 11.5–14.6)
WBC: 8 10*3/uL (ref 4.5–10.5)

## 2014-01-06 NOTE — Progress Notes (Signed)
[facility]. [address] [place],   [address] Phone: [phone] Fax:  [phone]  Date:  01/06/2014   ID:  [name], DOB [birth year], MRN [id number]  PCP:  Marcello Fennel, MD   History of Present Illness: [name] is a 48 y.o. male with a hx of DM, HTN, CAD, Ischemic Cardiomyopathy EF 20% with ST Jude ICD placed 4/10, at that time he also had a cardiac cath with patent stents. He is on chronic Coumadin therapy followed by Dr Baldemar Lenis for DVT.  Prior admission 11/17/11 after Nausea, vomiting and chest pain. Cardiac enzymes negative, BNP elevated of 2509, He was also given IV lasix and discharged on his usual dosing. Echocardiogram results with questionable vegetation on pacemaker wire vs scarring. Out Pt blood culture was obtained. and negative.  Dr. Caryl Comes adjusted ICD to only detect greater than 200 (SVT noted). 20% of the time his heart rate was greater than 100 beats per minute. TSH, CBC, BMET unremarkable. Toprol-XL 50 mg was restarted in addition to his carvedilol 25 mg twice a day. Heart rate is improved.  At prior visit, he was complaining of some increasing edema especially when standing for the majority of the day. He does have compression hose at home but he does not wear them usually. Lasix was increased to 80 mg twice a day back in February and his repeat blood work was reassuring.  On 02/05/13-no evidence of lower extremity edema. He has been doing very well. He did state that he had some left forearm discomfort when flexing his wrist for 2-3 days that was sensitive to the touch that felt similar to his prior blood clot in his leg that has since resolved. He is alert complaining of any arm discomfort. I carefully examined his arm it shows no evidence of edema or tenderness at this point. Likely musculoskeletal strain.  01/06/14-has occasional right-sided atypical sharp chest pain when lifting objects. Denies any shortness of breath. Occasional  fluctuations in weight are noted. Overall he is doing well. No defibrillation discharges.    Wt Readings from Last 3 Encounters:  01/06/14 189 lb (85.73 kg)  08/09/12 184 lb 9.6 oz (83.734 kg)  04/26/12 188 lb 6.4 oz (85.458 kg)     Past Medical History  Diagnosis Date  . Coronary artery disease      Widely patent previously placed stents in the left anterior   . Type II or unspecified type diabetes mellitus without mention of complication, not stated as uncontrolled   . Ischemic cardiomyopathy     Ejection fraction 15-20% catheterization 2010  . Hypertension, essential   . Automatic implantable cardiac defibrillator -[employer]. Jude's        . Benign neoplasm of colon   . Unspecified gastritis and gastroduodenitis without mention of hemorrhage   . 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 Hypersomnolent     Previous diagnosis of narcolepsy  . Crohn's disease   . High cholesterol   . Chronic systolic heart failure   . Angina   . GERD (gastroesophageal reflux disease)   . Hyperlipidemia   . ASCVD (arteriosclerotic cardiovascular disease)     , Anterior infarction 2005, LAD diagonal bifurcation intervention 03/2004  . Gastroparesis     Past Surgical History  Procedure Laterality Date  . Appendectomy    . Fetal surgery for congenital hernia    . Colon surgery    . Colectomy      "  for Crohn's"  . Cardiac defibrillator placement  2010    St. Jude ICD  . Inguinal hernia repair      Current Outpatient Prescriptions  Medication Sig Dispense Refill  . aspirin 81 MG tablet Take 81 mg by mouth daily.      [name] atorvastatin (LIPITOR) 80 MG tablet Take 1 tablet (80 mg total) by mouth 3 (three) times a week.  30 tablet  3  . carvedilol (COREG) 25 MG tablet Take 25 mg by mouth 2 (two) times daily with a meal.      . digoxin (LANOXIN) 0.125 MG tablet Take 0.125 mg by mouth daily.      [name] esomeprazole (NEXIUM) 40 MG capsule Take 40 mg by mouth daily before breakfast.      .  furosemide (LASIX) 80 MG tablet Take 1 tablet (80 mg total) by mouth 2 (two) times daily.  60 tablet  4  . hydrALAZINE (APRESOLINE) 25 MG tablet Take 25 mg by mouth 3 (three) times daily.      . insulin aspart (NOVOLOG) 100 UNIT/ML injection Inject 10 Units into the skin 3 (three) times daily before meals. Take 10-25 units into the skin 3 times daily.      . insulin glargine (LANTUS) 100 UNIT/ML injection Inject 100 Units into the skin as directed. Sliding scale.      . isosorbide mononitrate (IMDUR) 60 MG 24 hr tablet Take 90 mg by mouth daily.      [name] lisinopril (PRINIVIL,ZESTRIL) 20 MG tablet Take 40 mg by mouth daily.      . magnesium oxide (MAG-OX) 400 MG tablet Take 400 mg by mouth daily.      . metoCLOPramide (REGLAN) 10 MG tablet Take 10 mg by mouth 4 (four) times daily.      . nitroGLYCERIN (NITROSTAT) 0.4 MG SL tablet Place 0.4 mg under the tongue every 5 (five) minutes as needed for chest pain.      [name] ondansetron (ZOFRAN) 4 MG tablet Take 4 mg by mouth as needed for nausea.      . potassium chloride (K-DUR) 10 MEQ tablet Take 20 mEq by mouth daily.       [name] spironolactone (ALDACTONE) 25 MG tablet Take 25 mg by mouth daily.      [name] warfarin (COUMADIN) 7.5 MG tablet Take 7.5 mg by mouth daily.      . metoCLOPramide (REGLAN) 10 MG tablet Take 1 tablet (10 mg total) by mouth 4 (four) times daily -  before meals and at bedtime.  120 tablet  0   No current facility-administered medications for this visit.    Allergies:    Allergies  Allergen Reactions  . Metformin And Related Diarrhea    Social History:  The patient  reports that he has never smoked. He has never used smokeless tobacco. He reports that he does not drink alcohol or use illicit drugs.   No family history on file.  ROS:  Please see the history of present illness.   Denies any fevers, chills, orthopnea, PND, syncope   All other systems reviewed and negative.   PHYSICAL EXAM: VS:  BP 142/90  Pulse 80  Ht 5' 9"  (1.753 m)   Wt 189 lb (85.73 kg)  BMI 27.90 kg/m2 Well nourished, well developed, in no acute distress HEENT: normal, [place]/AT, EOMI Neck: no JVD, normal carotid upstroke, no bruit Cardiac:  normal S1, S2; RRR; no murm Lungs:  clear to auscultation bilaterally, no wheezing, rhonchi or rales Abd:  soft, nontender, no hepatomegaly, no bruits Ext: no edema, 2+ distal pulses Skin: warm and dry GU: deferred Neuro: no focal abnormalities noted, AAO x 3  EKG:  01/06/14-sinus rhythm, poor R wave progression, nonspecific ST-T wave change with T-wave inversion in V5, V6, 1, aVL. No significant change from prior.      ASSESSMENT AND PLAN:  1. Chronic systolic heart failure-currently well compensated. Multiple drugs reviewed. Excellent use of medications. No changes made. Checking CBC and basic metabolic profile. 2. Hypertension-mildly elevated today. Multidrug regimen. Monitor. 3. ICD-functioning well. Prior note by Dr. Caryl Comes reviewed. 4. CAD-previously patent stents 5. Cardio myopathy-EF 15-20% 6. Atypical chest pain-occasional right-sided sharp chest pain when lifting objects. Musculoskeletal. Discussed. 7. 6 month follow up. Daily weights. He knows to call if any symptoms are developing.  Signed, Candee Furbish, MD [facility]  01/06/2014 2:23 PM

## 2014-01-06 NOTE — Patient Instructions (Signed)
Your physician recommends that you have  lab work today--BMET/CBCd  Your physician wants you to follow-up in: 6 months with Dr Marlou Porch. (November 2015).  You will receive a reminder letter in the mail two months in advance. If you don't receive a letter, please call our office to schedule the follow-up appointment.

## 2014-01-10 ENCOUNTER — Telehealth: Payer: Self-pay | Admitting: Cardiology

## 2014-01-10 DIAGNOSIS — I5022 Chronic systolic (congestive) heart failure: Secondary | ICD-10-CM

## 2014-01-10 DIAGNOSIS — I2510 Atherosclerotic heart disease of native coronary artery without angina pectoris: Secondary | ICD-10-CM

## 2014-01-10 NOTE — Telephone Encounter (Signed)
Results to labs

## 2014-01-14 ENCOUNTER — Encounter: Payer: Self-pay | Admitting: Internal Medicine

## 2014-01-14 ENCOUNTER — Other Ambulatory Visit: Payer: Self-pay | Admitting: Internal Medicine

## 2014-01-14 ENCOUNTER — Ambulatory Visit (INDEPENDENT_AMBULATORY_CARE_PROVIDER_SITE_OTHER): Payer: BC Managed Care – PPO | Admitting: Internal Medicine

## 2014-01-14 ENCOUNTER — Encounter: Payer: BC Managed Care – PPO | Admitting: Internal Medicine

## 2014-01-14 VITALS — BP 142/84 | HR 81 | Ht 68.0 in | Wt 192.0 lb

## 2014-01-14 DIAGNOSIS — I255 Ischemic cardiomyopathy: Secondary | ICD-10-CM

## 2014-01-14 DIAGNOSIS — I2589 Other forms of chronic ischemic heart disease: Secondary | ICD-10-CM

## 2014-01-14 DIAGNOSIS — I471 Supraventricular tachycardia: Secondary | ICD-10-CM

## 2014-01-14 DIAGNOSIS — I498 Other specified cardiac arrhythmias: Secondary | ICD-10-CM

## 2014-01-14 DIAGNOSIS — Z95810 Presence of automatic (implantable) cardiac defibrillator: Secondary | ICD-10-CM

## 2014-01-14 LAB — MDC_IDC_ENUM_SESS_TYPE_INCLINIC
Battery Remaining Longevity: 49.2 mo
Brady Statistic RV Percent Paced: 0 %
HIGH POWER IMPEDANCE MEASURED VALUE: 44.2629
Implantable Pulse Generator Serial Number: 474683
Lead Channel Impedance Value: 362.5 Ohm
Lead Channel Pacing Threshold Amplitude: 0.5 V
Lead Channel Pacing Threshold Amplitude: 1 V
Lead Channel Pacing Threshold Pulse Width: 0.5 ms
Lead Channel Sensing Intrinsic Amplitude: 12 mV
Lead Channel Setting Pacing Amplitude: 2 V
Lead Channel Setting Pacing Amplitude: 2.5 V
Lead Channel Setting Pacing Pulse Width: 0.5 ms
MDC IDC MSMT BATTERY VOLTAGE: 2.57 V
MDC IDC MSMT LEADCHNL RA IMPEDANCE VALUE: 412.5 Ohm
MDC IDC MSMT LEADCHNL RA SENSING INTR AMPL: 4.7 mV
MDC IDC MSMT LEADCHNL RV PACING THRESHOLD PULSEWIDTH: 0.5 ms
MDC IDC SESS DTM: 20150512145908
MDC IDC SET LEADCHNL RV SENSING SENSITIVITY: 0.3 mV
MDC IDC SET ZONE DETECTION INTERVAL: 300 ms
MDC IDC SET ZONE DETECTION INTERVAL: 400 ms
MDC IDC STAT BRADY RA PERCENT PACED: 0.22 %
Zone Setting Detection Interval: 250 ms

## 2014-01-14 NOTE — Progress Notes (Signed)
Patient Care Team: Marcello Fennel, MD as PCP - General (Family Medicine)   HPI  [name] is a 48 y.o. male Seen In followup for an ICD implanted 2010 for primary prevention. This was done in the setting of prior MI, prior stenting and ischemic cardiomyopathy with an ejection fraction of 15-20% and patent stents . It was deferred to Dr. Marlou Porch to consider the addition of Aldactone    He is having atypical chest pain sharp stabbing pains lasting only seconds. He denies significant shortness of breath   He takes warfarin because he has a history of recurrent lower extremity venous thrombosis. No workup was undertaken for genetic evaluation.    Past Medical History  Diagnosis Date  . Coronary artery disease      Widely patent previously placed stents in the left anterior   . Type II or unspecified type diabetes mellitus without mention of complication, not stated as uncontrolled   . Ischemic cardiomyopathy     Ejection fraction 15-20% catheterization 2010  . Hypertension, essential   . Automatic implantable cardiac defibrillator -[employer]. Jude's        . Benign neoplasm of colon   . Unspecified gastritis and gastroduodenitis without mention of hemorrhage   . 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 Hypersomnolent     Previous diagnosis of narcolepsy  . Crohn's disease   . High cholesterol   . Chronic systolic heart failure   . Angina   . GERD (gastroesophageal reflux disease)   . Hyperlipidemia   . ASCVD (arteriosclerotic cardiovascular disease)     , Anterior infarction 2005, LAD diagonal bifurcation intervention 03/2004  . Gastroparesis     Past Surgical History  Procedure Laterality Date  . Appendectomy    . Fetal surgery for congenital hernia    . Colon surgery    . Colectomy      "for Crohn's"  . Cardiac defibrillator placement  2010    St. Jude ICD  . Inguinal hernia repair      Current Outpatient Prescriptions  Medication Sig  Dispense Refill  . aspirin 81 MG tablet Take 81 mg by mouth daily.      [name] atorvastatin (LIPITOR) 80 MG tablet Take 1 tablet (80 mg total) by mouth 3 (three) times a week.  30 tablet  3  . carvedilol (COREG) 25 MG tablet Take 25 mg by mouth 2 (two) times daily with a meal.      . digoxin (LANOXIN) 0.125 MG tablet Take 0.125 mg by mouth daily.      [name] esomeprazole (NEXIUM) 40 MG capsule Take 40 mg by mouth daily before breakfast.      . furosemide (LASIX) 80 MG tablet Take 1 tablet (80 mg total) by mouth 2 (two) times daily.  60 tablet  4  . hydrALAZINE (APRESOLINE) 25 MG tablet Take 25 mg by mouth 3 (three) times daily.      . insulin aspart (NOVOLOG) 100 UNIT/ML injection Inject 10 Units into the skin 3 (three) times daily before meals. Take 10-25 units into the skin 3 times daily.      . insulin glargine (LANTUS) 100 UNIT/ML injection Inject 100 Units into the skin as directed. Sliding scale.      . isosorbide mononitrate (IMDUR) 60 MG 24 hr tablet Take 90 mg by mouth daily.      [name] lisinopril (PRINIVIL,ZESTRIL) 20 MG tablet Take 40 mg by mouth daily.      [name]  magnesium oxide (MAG-OX) 400 MG tablet Take 400 mg by mouth daily.      . metoCLOPramide (REGLAN) 10 MG tablet Take 10 mg by mouth 4 (four) times daily.      . nitroGLYCERIN (NITROSTAT) 0.4 MG SL tablet Place 0.4 mg under the tongue every 5 (five) minutes as needed for chest pain.      [name] ondansetron (ZOFRAN) 4 MG tablet Take 4 mg by mouth as needed for nausea.      . potassium chloride (K-DUR) 10 MEQ tablet Take 20 mEq by mouth daily.       [name] spironolactone (ALDACTONE) 25 MG tablet Take 25 mg by mouth daily.      [name] warfarin (COUMADIN) 7.5 MG tablet Take 7.5 mg by mouth daily.      . metoCLOPramide (REGLAN) 10 MG tablet Take 1 tablet (10 mg total) by mouth 4 (four) times daily -  before meals and at bedtime.  120 tablet  0   No current facility-administered medications for this visit.    Allergies  Allergen Reactions  . Metformin And  Related Diarrhea    Review of Systems negative except from HPI and PMH  Physical Exam BP 142/84  Pulse 81  Ht 5' 8"  (1.727 m)  Wt 192 lb (87.091 kg)  BMI 29.20 kg/m2 Well developed and well nourished in no acute distress HENT normal E scleral and icterus clear Neck Supple JVP flat; carotids brisk and full Clear to ausculation regular rate and rhythm, no murmurs gallops or rub Soft with active bowel sounds No clubbing cyanosis  Edema Alert and oriented, grossly normal motor and sensory function Skin Warm and Dry    Assessment and  Plan  Ischemic cardiomyopathy  Congestive heart failure-chronic systolic  Chest pain-atypical  Implantable defibrillator - St Judes  The patient's device was interrogated.  The information was reviewed. No changes were made in the programming.     STABLE continue current meds

## 2014-01-14 NOTE — Patient Instructions (Signed)
Your physician recommends that you continue on your current medications as directed. Please refer to the Current Medication list given to you today.  Remote monitoring is used to monitor your Pacemaker of ICD from home. This monitoring reduces the number of office visits required to check your device to one time per year. It allows [place] to keep an eye on the functioning of your device to ensure it is working properly. You are scheduled for a device check from home on 04/17/14. You may send your transmission at any time that day. If you have a wireless device, the transmission will be sent automatically. After your physician reviews your transmission, you will receive a postcard with your next transmission date.  Your physician wants you to follow-up in: 1 year with Dr. Caryl Comes.  You will receive a reminder letter in the mail two months in advance. If you don't receive a letter, please call our office to schedule the follow-up appointment.

## 2014-01-17 ENCOUNTER — Other Ambulatory Visit: Payer: BC Managed Care – PPO

## 2014-04-17 ENCOUNTER — Telehealth: Payer: Self-pay | Admitting: Cardiology

## 2014-04-17 ENCOUNTER — Encounter: Payer: Self-pay | Admitting: *Deleted

## 2014-04-17 NOTE — Telephone Encounter (Signed)
LMOVM reminding pt to send remote transmission.   

## 2014-04-18 ENCOUNTER — Encounter: Payer: Self-pay | Admitting: Cardiology

## 2014-04-29 ENCOUNTER — Encounter: Payer: Self-pay | Admitting: *Deleted

## 2014-06-10 ENCOUNTER — Encounter: Payer: Self-pay | Admitting: *Deleted

## 2014-06-18 ENCOUNTER — Encounter: Payer: Self-pay | Admitting: Cardiology

## 2014-08-01 ENCOUNTER — Other Ambulatory Visit: Payer: Self-pay | Admitting: Cardiology

## 2014-09-04 ENCOUNTER — Other Ambulatory Visit: Payer: Self-pay | Admitting: Cardiology

## 2014-09-26 ENCOUNTER — Ambulatory Visit: Payer: Self-pay | Admitting: Cardiology

## 2014-10-23 ENCOUNTER — Encounter: Payer: Self-pay | Admitting: *Deleted

## 2014-11-09 ENCOUNTER — Other Ambulatory Visit: Payer: Self-pay | Admitting: Cardiology

## 2014-12-20 ENCOUNTER — Other Ambulatory Visit: Payer: Self-pay | Admitting: Cardiology

## 2015-01-02 ENCOUNTER — Encounter: Payer: Self-pay | Admitting: Gastroenterology

## 2015-01-07 ENCOUNTER — Ambulatory Visit (INDEPENDENT_AMBULATORY_CARE_PROVIDER_SITE_OTHER): Payer: 59 | Admitting: Cardiology

## 2015-01-07 ENCOUNTER — Encounter: Payer: Self-pay | Admitting: Cardiology

## 2015-01-07 VITALS — BP 140/80 | HR 64 | Ht 68.0 in | Wt 192.0 lb

## 2015-01-07 DIAGNOSIS — I2583 Coronary atherosclerosis due to lipid rich plaque: Secondary | ICD-10-CM

## 2015-01-07 DIAGNOSIS — I255 Ischemic cardiomyopathy: Secondary | ICD-10-CM

## 2015-01-07 DIAGNOSIS — I2510 Atherosclerotic heart disease of native coronary artery without angina pectoris: Secondary | ICD-10-CM

## 2015-01-07 DIAGNOSIS — I10 Essential (primary) hypertension: Secondary | ICD-10-CM

## 2015-01-07 DIAGNOSIS — Z7901 Long term (current) use of anticoagulants: Secondary | ICD-10-CM

## 2015-01-07 DIAGNOSIS — I5022 Chronic systolic (congestive) heart failure: Secondary | ICD-10-CM

## 2015-01-07 DIAGNOSIS — I82409 Acute embolism and thrombosis of unspecified deep veins of unspecified lower extremity: Secondary | ICD-10-CM | POA: Diagnosis not present

## 2015-01-07 DIAGNOSIS — Z95810 Presence of automatic (implantable) cardiac defibrillator: Secondary | ICD-10-CM

## 2015-01-07 LAB — BASIC METABOLIC PANEL
BUN: 26 mg/dL — ABNORMAL HIGH (ref 6–23)
CALCIUM: 9 mg/dL (ref 8.4–10.5)
CHLORIDE: 104 meq/L (ref 96–112)
CO2: 29 meq/L (ref 19–32)
CREATININE: 1.32 mg/dL (ref 0.40–1.50)
GFR: 73.75 mL/min (ref >60.00)
GLUCOSE: 296 mg/dL — AB (ref 70–99)
Potassium: 4.2 mEq/L (ref 3.5–5.1)
Sodium: 136 mEq/L (ref 135–145)

## 2015-01-07 LAB — PROTIME-INR
INR: 0.9 ratio (ref 0.8–1.0)
Prothrombin Time: 10.4 s (ref 9.6–13.1)

## 2015-01-07 LAB — CBC
HEMATOCRIT: 36.3 % — AB (ref 39.0–52.0)
HEMOGLOBIN: 12 g/dL — AB (ref 13.0–17.0)
MCHC: 33 g/dL (ref 30.0–36.0)
MCV: 82.7 fl (ref 78.0–100.0)
PLATELETS: 240 10*3/uL (ref 150.0–400.0)
RBC: 4.39 Mil/uL (ref 4.22–5.81)
RDW: 14 % (ref 11.5–15.5)
WBC: 7.2 10*3/uL (ref 4.0–10.5)

## 2015-01-07 NOTE — Progress Notes (Signed)
[facility]. [address] [place], [place]  [address] Phone: [phone] Fax:  [phone]  Date:  01/07/2015   ID:  [name], DOB November 24, 1965, MRN [id number]  PCP:  Marcello Fennel, MD   History of Present Illness: [name] is a 49 y.o. male with a hx of DM, HTN, CAD, Ischemic Cardiomyopathy EF 20% with ST Jude ICD placed 4/10, at that time he also had a cardiac cath with patent stents. He is on chronic Coumadin therapy followed by Dr Baldemar Lenis for DVT.  Prior admission 11/17/11 after Nausea, vomiting and chest pain. Cardiac enzymes negative, BNP elevated of 2509, He was also given IV lasix and discharged on his usual dosing. Echocardiogram results with questionable vegetation on pacemaker wire vs scarring. Out Pt blood culture was obtained. and negative.  Dr. Caryl Comes adjusted ICD to only detect greater than 200 (SVT noted). 20% of the time his heart rate was greater than 100 beats per minute. TSH, CBC, BMET unremarkable. Toprol-XL 50 mg was restarted in addition to his carvedilol 25 mg twice a day. Heart rate is improved.  At prior visit, he was complaining of some increasing edema especially when standing for the majority of the day. He does have compression hose at home but he does not wear them usually. Lasix was increased to 80 mg twice a day back in February and his repeat blood work was reassuring.  On 02/05/13-no evidence of lower extremity edema. He has been doing very well. He did state that he had some left forearm discomfort when flexing his wrist for 2-3 days that was sensitive to the touch that felt similar to his prior blood clot in his leg that has since resolved. He is alert complaining of any arm discomfort. I carefully examined his arm it shows no evidence of edema or tenderness at this point. Likely musculoskeletal strain.  01/06/14-has occasional right-sided atypical sharp chest pain when lifting objects. Denies any shortness of breath. Occasional  fluctuations in weight are noted. Overall he is doing well. No defibrillation discharges.  01/07/15-overall doing well. NYHA class I currently. No symptoms. Feeling well. No weight gain. He does report that he has missed a few Coumadin checks. No syncope, no bleeding.    Wt Readings from Last 3 Encounters:  01/07/15 192 lb (87.091 kg)  01/14/14 192 lb (87.091 kg)  01/06/14 189 lb (85.73 kg)     Past Medical History  Diagnosis Date  . Coronary artery disease      Widely patent previously placed stents in the left anterior   . Type II or unspecified type diabetes mellitus without mention of complication, not stated as uncontrolled   . Ischemic cardiomyopathy     Ejection fraction 15-20% catheterization 2010  . Hypertension, essential   . Automatic implantable cardiac defibrillator -St. Jude's        . Benign neoplasm of colon   . Unspecified gastritis and gastroduodenitis without mention of hemorrhage   . 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 Hypersomnolent     Previous diagnosis of narcolepsy  . Crohn's disease   . High cholesterol   . Chronic systolic heart failure   . Angina   . GERD (gastroesophageal reflux disease)   . Hyperlipidemia   . ASCVD (arteriosclerotic cardiovascular disease)     , Anterior infarction 2005, LAD diagonal bifurcation intervention 03/2004  . Gastroparesis     Past Surgical History  Procedure Laterality Date  .  Appendectomy    . Fetal surgery for congenital hernia    . Colon surgery    . Colectomy      "for Crohn's"  . Cardiac defibrillator placement  2010    St. Jude ICD  . Inguinal hernia repair      Current Outpatient Prescriptions  Medication Sig Dispense Refill  . aspirin 81 MG tablet Take 81 mg by mouth daily.    [name] atorvastatin (LIPITOR) 80 MG tablet Take 1 tablet (80 mg total) by mouth 3 (three) times a week. 30 tablet 3  . carvedilol (COREG) 25 MG tablet Take 25 mg by mouth 2 (two) times daily with a meal.    .  digoxin (LANOXIN) 0.125 MG tablet Take 0.125 mg by mouth daily.    [name] esomeprazole (NEXIUM) 40 MG capsule Take 40 mg by mouth daily before breakfast.    . furosemide (LASIX) 80 MG tablet Take 1 tablet (80 mg total) by mouth 2 (two) times daily. 60 tablet 6  . hydrALAZINE (APRESOLINE) 25 MG tablet Take 25 mg by mouth 3 (three) times daily.    . insulin aspart (NOVOLOG) 100 UNIT/ML injection Inject 10 Units into the skin 3 (three) times daily before meals. Take 10-25 units into the skin 3 times daily.    . insulin glargine (LANTUS) 100 UNIT/ML injection Inject 100 Units into the skin as directed. Sliding scale.    . isosorbide mononitrate (IMDUR) 60 MG 24 hr tablet Take 90 mg by mouth daily.    [name] lisinopril (PRINIVIL,ZESTRIL) 20 MG tablet Take 40 mg by mouth daily.    . magnesium oxide (MAG-OX) 400 MG tablet Take 400 mg by mouth daily.    . metoCLOPramide (REGLAN) 10 MG tablet Take 1 tablet (10 mg total) by mouth 4 (four) times daily -  before meals and at bedtime. 120 tablet 0  . nitroGLYCERIN (NITROSTAT) 0.4 MG SL tablet Place 0.4 mg under the tongue every 5 (five) minutes as needed for chest pain.    [name] ondansetron (ZOFRAN) 4 MG tablet Take 4 mg by mouth as needed for nausea.    . potassium chloride (K-DUR) 10 MEQ tablet Take 20 mEq by mouth daily.     [name] spironolactone (ALDACTONE) 25 MG tablet Take 25 mg by mouth daily.    [name] warfarin (COUMADIN) 7.5 MG tablet Take 7.5 mg by mouth daily.     No current facility-administered medications for this visit.    Allergies:    Allergies  Allergen Reactions  . Metformin And Related Diarrhea    Social History:  The patient  reports that he has never smoked. He has never used smokeless tobacco. He reports that he does not drink alcohol or use illicit drugs.   Family History  Problem Relation Age of Onset  . Heart attack Father   . Stroke Paternal Grandfather     ROS:  Please see the history of present illness.   Denies any fevers, chills,  orthopnea, PND, syncope   All other systems reviewed and negative.   PHYSICAL EXAM: VS:  BP 140/80 mmHg  Pulse 64  Ht 5' 8"  (1.727 m)  Wt 192 lb (87.091 kg)  BMI 29.20 kg/m2 Well nourished, well developed, in no acute distress HEENT: normal, [place]/AT, EOMI Neck: no JVD, normal carotid upstroke, no bruit Cardiac:  normal S1, S2; RRR; no murmur Lungs:  clear to auscultation bilaterally, no wheezing, rhonchi or rales Abd: soft, nontender, no hepatomegaly, no bruits Ext: no edema, 2+ distal pulses  Skin: warm and dry GU: deferred Neuro: no focal abnormalities noted, AAO x 3  EKG:  01/07/15-normal sinus rhythm, 63, anteroseptal infarct age undetermined, poor R-wave progression . 01/06/14-sinus rhythm, poor R wave progression, nonspecific ST-T wave change with T-wave inversion in V5, V6, 1, aVL. No significant change from prior.      ASSESSMENT AND PLAN:  1. Chronic systolic heart failure-currently well compensated. Multiple drugs reviewed. Excellent use of medications. No changes made. Checking CBC and basic metabolic profile. Beta blocker, ACE inhibitor, spironolactone, hydralazine, nitrates. I will check basic metabolic profile. 2. Chronic anticoagulation with warfarin because of recurrent lower extremity venous thrombosis. He states that it is been a while since he has had his INR checked by Dr. Baldemar Lenis. We will go ahead and check that with blood work today. We will check a CBC. 3. Hypertension-mildly elevated today. Multidrug regimen. Monitor. 4. ICD-functioning well. Prior note by Dr. Caryl Comes reviewed. 5. CAD-previously patent stents 6. Cardiomyopathy-EF 15-20% 7. 6 month follow up. Daily weights. He knows to call if any symptoms are developing.  Signed, Candee Furbish, MD [facility]  01/07/2015 10:17 AM

## 2015-01-07 NOTE — Patient Instructions (Signed)
Medication Instructions:  Your physician recommends that you continue on your current medications as directed. Please refer to the Current Medication list given to you today.  Labwork: Please have CBC, BMP and INR today.  Testing/Procedures: none  Follow-Up: Follow up in 6 months with Dr. Marlou Porch.  You will receive a letter in the mail 2 months before you are due.  Please call us when you receive this letter to schedule your follow up appointment.  Thank you for choosing [facility]!!

## 2015-01-08 ENCOUNTER — Encounter: Payer: Self-pay | Admitting: Internal Medicine

## 2015-01-08 ENCOUNTER — Ambulatory Visit (INDEPENDENT_AMBULATORY_CARE_PROVIDER_SITE_OTHER): Payer: 59 | Admitting: Internal Medicine

## 2015-01-08 VITALS — BP 145/86 | HR 63 | Ht 68.0 in | Wt 192.2 lb

## 2015-01-08 DIAGNOSIS — I5022 Chronic systolic (congestive) heart failure: Secondary | ICD-10-CM | POA: Diagnosis not present

## 2015-01-08 DIAGNOSIS — Z79899 Other long term (current) drug therapy: Secondary | ICD-10-CM | POA: Diagnosis not present

## 2015-01-08 DIAGNOSIS — I255 Ischemic cardiomyopathy: Secondary | ICD-10-CM

## 2015-01-08 DIAGNOSIS — Z4502 Encounter for adjustment and management of automatic implantable cardiac defibrillator: Secondary | ICD-10-CM | POA: Diagnosis not present

## 2015-01-08 LAB — CUP PACEART INCLINIC DEVICE CHECK
Battery Remaining Longevity: 27.6 mo
Battery Voltage: 2.56 V
Brady Statistic RA Percent Paced: 0.4 %
Brady Statistic RV Percent Paced: 0 %
Date Time Interrogation Session: 20160505113822
HIGH POWER IMPEDANCE MEASURED VALUE: 38.0391
Lead Channel Impedance Value: 337.5 Ohm
Lead Channel Pacing Threshold Amplitude: 0.75 V
Lead Channel Pacing Threshold Amplitude: 0.75 V
Lead Channel Sensing Intrinsic Amplitude: 5 mV
Lead Channel Setting Pacing Amplitude: 2 V
Lead Channel Setting Sensing Sensitivity: 0.3 mV
MDC IDC MSMT LEADCHNL RA PACING THRESHOLD PULSEWIDTH: 0.5 ms
MDC IDC MSMT LEADCHNL RV IMPEDANCE VALUE: 325 Ohm
MDC IDC MSMT LEADCHNL RV PACING THRESHOLD PULSEWIDTH: 0.5 ms
MDC IDC MSMT LEADCHNL RV SENSING INTR AMPL: 11.7 mV
MDC IDC SET LEADCHNL RV PACING AMPLITUDE: 2.5 V
MDC IDC SET LEADCHNL RV PACING PULSEWIDTH: 0.5 ms
MDC IDC SET ZONE DETECTION INTERVAL: 300 ms
Pulse Gen Serial Number: 474683
Zone Setting Detection Interval: 250 ms
Zone Setting Detection Interval: 400 ms

## 2015-01-08 NOTE — Progress Notes (Signed)
Patient Care Team: Derinda Late, MD as PCP - General (Family Medicine)   HPI  [name] is a 49 y.o. male Seen In followup for an ICD implanted 2010 for primary prevention. This was done in the setting of prior MI, prior stenting and ischemic cardiomyopathy with an ejection fraction of 15-20% and patent stents .   He is on guideline directed medical therapy and has no complaints of chest pain shortness of breath lightheadedness or palpitations syncope or ICD discharge; he does have some chronic stable lower extremity edema  He takes warfarin because he has a history of recurrent lower extremity venous thrombosis. No workup was undertaken for genetic evaluation.    Past Medical History  Diagnosis Date  . Coronary artery disease      Widely patent previously placed stents in the left anterior   . Type II or unspecified type diabetes mellitus without mention of complication, not stated as uncontrolled   . Ischemic cardiomyopathy     Ejection fraction 15-20% catheterization 2010  . Hypertension, essential   . Automatic implantable cardiac defibrillator -St. Jude's        . Benign neoplasm of colon   . Unspecified gastritis and gastroduodenitis without mention of hemorrhage   . 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 Hypersomnolent     Previous diagnosis of narcolepsy  . Crohn's disease   . High cholesterol   . Chronic systolic heart failure   . Angina   . GERD (gastroesophageal reflux disease)   . Hyperlipidemia   . ASCVD (arteriosclerotic cardiovascular disease)     , Anterior infarction 2005, LAD diagonal bifurcation intervention 03/2004  . Gastroparesis     Past Surgical History  Procedure Laterality Date  . Appendectomy    . Fetal surgery for congenital hernia    . Colon surgery    . Colectomy      "for Crohn's"  . Cardiac defibrillator placement  2010    St. Jude ICD  . Inguinal hernia repair      Current Outpatient Prescriptions    Medication Sig Dispense Refill  . aspirin 81 MG tablet Take 81 mg by mouth daily.    [name] atorvastatin (LIPITOR) 80 MG tablet Take 1 tablet (80 mg total) by mouth 3 (three) times a week. 30 tablet 3  . carvedilol (COREG) 25 MG tablet Take 25 mg by mouth 2 (two) times daily with a meal.    . digoxin (LANOXIN) 0.125 MG tablet Take 0.125 mg by mouth daily.    [name] esomeprazole (NEXIUM) 40 MG capsule Take 40 mg by mouth daily before breakfast.    . furosemide (LASIX) 80 MG tablet Take 1 tablet (80 mg total) by mouth 2 (two) times daily. 60 tablet 6  . hydrALAZINE (APRESOLINE) 25 MG tablet Take 25 mg by mouth 3 (three) times daily.    . insulin aspart (NOVOLOG) 100 UNIT/ML injection Inject 10 Units into the skin 3 (three) times daily before meals. Take 10-25 units into the skin 3 times daily.    . insulin glargine (LANTUS) 100 UNIT/ML injection Inject 100 Units into the skin as directed. Sliding scale.    . isosorbide mononitrate (IMDUR) 60 MG 24 hr tablet Take 90 mg by mouth daily.    [name] lisinopril (PRINIVIL,ZESTRIL) 20 MG tablet Take 40 mg by mouth daily.    . magnesium oxide (MAG-OX) 400 MG tablet Take 400 mg by mouth daily.    . metoCLOPramide (  REGLAN) 10 MG tablet Take 1 tablet (10 mg total) by mouth 4 (four) times daily -  before meals and at bedtime. 120 tablet 0  . nitroGLYCERIN (NITROSTAT) 0.4 MG SL tablet Place 0.4 mg under the tongue every 5 (five) minutes as needed for chest pain.    [name] ondansetron (ZOFRAN) 4 MG tablet Take 4 mg by mouth as needed for nausea.    . potassium chloride (K-DUR) 10 MEQ tablet Take 20 mEq by mouth daily.     [name] spironolactone (ALDACTONE) 25 MG tablet Take 25 mg by mouth daily.    [name] warfarin (COUMADIN) 7.5 MG tablet Take 7.5 mg by mouth daily.     No current facility-administered medications for this visit.    Allergies  Allergen Reactions  . Metformin And Related Diarrhea    Review of Systems negative except from HPI and PMH  Physical Exam BP 145/86 mmHg   Pulse 63  Ht 5' 8"  (1.727 m)  Wt 192 lb 3.2 oz (87.181 kg)  BMI 29.23 kg/m2 Well developed and well nourished in no acute distress HENT normal E scleral and icterus clear Neck Supple JVP flat; carotids brisk and full Clear to ausculation regular rate and rhythm, no murmurs gallops or rub Soft with active bowel sounds No clubbing cyanosis  Edema Alert and oriented, grossly normal motor and sensory function Skin Warm and Dry  ECG demonstrated sinus rhythm at 63 Intervals 18/09/40  Assessment and  Plan  Hypertension  Ischemic cardiomyopathy  Without symptoms of ischemia  Congestive heart failure-chronic systolic  Euvolemic continue current meds  Implantable defibrillator - St Judes  The patient's device was interrogated.  The information was reviewed. No changes were made in the programming.    He is doing quite well.  His blood pressure is modestly elevated. In the context of the ACCORD TRIAL is probably reasonable to target the mid 130s. I've asked him to check his blood pressure home to see how it is that he is doing.  He is a high risk medication, digoxin. Will obtain a level.

## 2015-01-08 NOTE — Patient Instructions (Addendum)
Medication Instructions:  Your physician recommends that you continue on your current medications as directed. Please refer to the Current Medication list given to you today.  Labwork: Digoxin level  Testing/Procedures: None ordered  Follow-Up: Remote monitoring is used to monitor your Pacemaker of ICD from home. This monitoring reduces the number of office visits required to check your device to one time per year. It allows [place] to keep an eye on the functioning of your device to ensure it is working properly. You are scheduled for a device check from home on 04/09/15. You may send your transmission at any time that day. If you have a wireless device, the transmission will be sent automatically. After your physician reviews your transmission, you will receive a postcard with your next transmission date.  Your physician wants you to follow-up in: 1 year with Dr. Caryl Comes.  You will receive a reminder letter in the mail two months in advance. If you don't receive a letter, please call our office to schedule the follow-up appointment.   Thank you for choosing [facility]!!

## 2015-01-09 LAB — DIGOXIN LEVEL: Digoxin Level: 0.1 ng/mL — ABNORMAL LOW (ref 0.8–2.0)

## 2015-03-17 ENCOUNTER — Telehealth: Payer: Self-pay | Admitting: Cardiology

## 2015-03-17 NOTE — Telephone Encounter (Signed)
New message     Pt c/o of Chest Pain: STAT if CP now or developed within 24 hours  1. Are you having CP right now? A little 2. Are you experiencing any other symptoms (ex. SOB, nausea, vomiting, sweating)?  Extremely tired 3. How long have you been experiencing CP?  1.5 days 4. Is your CP continuous or coming and going?  Come and go  5. Have you taken Nitroglycerin? no?

## 2015-03-17 NOTE — Telephone Encounter (Signed)
Spoke with pt who is reporting chest discomfort for the past 1 and 1/2 days along with fatigue.  The fatigue he noticed this AM and it has not gotten better throughout the day.  He denies any SOB or N/V.  There is no correlation between movement or breathing deeply.  Nothing relieves the discomfort and nothing makes it worse.  There is no radiation with it.  On a scale from 1 to 10 it is about a 3 or 4 and never more than a 5.  He is not having the discomfort at this time.  Advised pt to use SL nitroglycerin if pain reoccurs.  He is aware to call 911 and be taken to the nearest hospital for evaluation if he has to use 3 SL Ntg.  He is also advised that if the pain continues to reoccur after being relieved with SL he should call back to schedule to be seen by MD/PA/NP.  He states understanding.

## 2015-04-09 ENCOUNTER — Telehealth: Payer: Self-pay | Admitting: Cardiology

## 2015-04-09 ENCOUNTER — Encounter: Payer: 59 | Admitting: *Deleted

## 2015-04-09 NOTE — Telephone Encounter (Signed)
Attempted to confirm remote transmission with pt. No answer and was unable to leave a message.   

## 2015-04-10 ENCOUNTER — Encounter: Payer: Self-pay | Admitting: Cardiology

## 2015-05-03 ENCOUNTER — Telehealth: Payer: Self-pay | Admitting: Physician Assistant

## 2015-05-03 NOTE — Telephone Encounter (Signed)
Patient called to report having constant chest pain which is right sided and 4 out of 10 in intensity.  Nothing seems to make it better or worse, including exertion or deep breathing. It is not the same symptom he had when he had his heart attack.  He hasn't lifted anything heavy lately. There is no nausea, vomiting, shortness of breath, diaphoresis, radiation, dizziness.  Recommended trying ibuprofen or Tylenol. Call in the next 2 days if it's not getting better.  Tarri Fuller PAC

## 2015-07-24 ENCOUNTER — Encounter: Payer: Self-pay | Admitting: *Deleted

## 2015-10-16 ENCOUNTER — Other Ambulatory Visit: Payer: Self-pay | Admitting: Cardiology

## 2016-01-24 ENCOUNTER — Other Ambulatory Visit: Payer: Self-pay | Admitting: Cardiology

## 2016-04-14 ENCOUNTER — Other Ambulatory Visit: Payer: Self-pay | Admitting: Cardiology

## 2016-05-06 ENCOUNTER — Other Ambulatory Visit: Payer: Self-pay | Admitting: *Deleted

## 2016-05-06 ENCOUNTER — Telehealth: Payer: Self-pay | Admitting: *Deleted

## 2016-05-06 MED ORDER — FUROSEMIDE 80 MG PO TABS: 80.0000 mg | ORAL_TABLET | Freq: Two times a day (BID) | ORAL | 1 refills | Status: DC

## 2016-05-06 NOTE — Telephone Encounter (Signed)
Patient called regarding a news story he had heard about people "hacking" ICDs.  Advised patient that there have not been any devices "hacked" into and that there is a firmware upgrade available.  At the present, our MDs are not recommending the firmware upgrade citing that the risks may be too great (very slight chance that upgrade may result in backup mode).  Patient is appreciative of explanation.  Patient is overdue for follow-up as of 01/2016.  Patient agreeable to appointment with Dr. Caryl Comes on 05/31/16 at 4:00pm.  He denies additional questions or concerns at this time.

## 2016-05-31 ENCOUNTER — Encounter (INDEPENDENT_AMBULATORY_CARE_PROVIDER_SITE_OTHER): Payer: Self-pay

## 2016-05-31 ENCOUNTER — Ambulatory Visit (INDEPENDENT_AMBULATORY_CARE_PROVIDER_SITE_OTHER): Payer: BLUE CROSS/BLUE SHIELD | Admitting: *Deleted

## 2016-05-31 DIAGNOSIS — I5022 Chronic systolic (congestive) heart failure: Secondary | ICD-10-CM | POA: Diagnosis not present

## 2016-05-31 DIAGNOSIS — I255 Ischemic cardiomyopathy: Secondary | ICD-10-CM | POA: Diagnosis not present

## 2016-05-31 LAB — CUP PACEART INCLINIC DEVICE CHECK
Battery Remaining Longevity: 0 mo
Battery Voltage: 2.44 V
Brady Statistic RV Percent Paced: 0 %
Date Time Interrogation Session: 20170926171227
HighPow Impedance: 42.943
Implantable Lead Implant Date: 20100430
Implantable Lead Location: 753859
Implantable Lead Model: 7121
Lead Channel Impedance Value: 400 Ohm
Lead Channel Pacing Threshold Amplitude: 0.75 V
Lead Channel Pacing Threshold Amplitude: 0.75 V
Lead Channel Pacing Threshold Amplitude: 1 V
Lead Channel Pacing Threshold Pulse Width: 0.5 ms
Lead Channel Pacing Threshold Pulse Width: 0.5 ms
Lead Channel Sensing Intrinsic Amplitude: 5 mV
Lead Channel Setting Pacing Pulse Width: 0.5 ms
Lead Channel Setting Sensing Sensitivity: 0.3 mV
MDC IDC LEAD IMPLANT DT: 20100430
MDC IDC LEAD LOCATION: 753860
MDC IDC MSMT LEADCHNL RA IMPEDANCE VALUE: 400 Ohm
MDC IDC MSMT LEADCHNL RA PACING THRESHOLD PULSEWIDTH: 0.5 ms
MDC IDC MSMT LEADCHNL RV PACING THRESHOLD AMPLITUDE: 1 V
MDC IDC MSMT LEADCHNL RV PACING THRESHOLD PULSEWIDTH: 0.5 ms
MDC IDC MSMT LEADCHNL RV SENSING INTR AMPL: 11.7 mV
MDC IDC SET LEADCHNL RA PACING AMPLITUDE: 2 V
MDC IDC SET LEADCHNL RV PACING AMPLITUDE: 2.5 V
MDC IDC STAT BRADY RA PERCENT PACED: 1 %
Pulse Gen Serial Number: 474683

## 2016-05-31 NOTE — Progress Notes (Signed)
ICD check in clinic. Normal device function. Thresholds and sensing consistent with previous device measurements. Impedance trends stable over time. (5) "VT-1" episodes--rate only, SVT. (55) SVT episodes recorded. No mode switches. Histogram distribution appropriate for patient and level of activity. No changes made this session. Device programmed at appropriate safety margins. Device programmed to optimize intrinsic conduction. Estimated longevity <3 months. Pt will follow up with SK on 11/8. Patient education completed including shock plan. Vibration demonstrated for patient.

## 2016-06-22 ENCOUNTER — Telehealth: Payer: Self-pay

## 2016-06-22 NOTE — Telephone Encounter (Signed)
Called pt to let him know that he did not need to come to his appointment today d/t he was seen on 9/26 by device clinic and has an apt w/ SK on 07/13/2016. Pt stated that his device started vibrating a few days. Informed pt that he could come in today and have the alert turned off, informed pt that he had 3 months of battery life from the day he felt the alert. Pt stated that since he was not going to be be seeing Dr. Caryl Comes today, he would cancel his apt with device clinic today and keep his apt with Dr. Caryl Comes on 07/13/2016.

## 2016-06-30 ENCOUNTER — Encounter: Payer: Self-pay | Admitting: Internal Medicine

## 2016-07-13 ENCOUNTER — Encounter: Payer: Self-pay | Admitting: Internal Medicine

## 2016-07-13 ENCOUNTER — Ambulatory Visit (INDEPENDENT_AMBULATORY_CARE_PROVIDER_SITE_OTHER): Payer: BLUE CROSS/BLUE SHIELD | Admitting: Cardiology

## 2016-07-13 ENCOUNTER — Ambulatory Visit: Payer: BLUE CROSS/BLUE SHIELD | Admitting: Internal Medicine

## 2016-07-13 ENCOUNTER — Encounter (INDEPENDENT_AMBULATORY_CARE_PROVIDER_SITE_OTHER): Payer: Self-pay

## 2016-07-13 ENCOUNTER — Encounter: Payer: Self-pay | Admitting: Cardiology

## 2016-07-13 VITALS — BP 126/80 | HR 86 | Ht 69.0 in | Wt 189.2 lb

## 2016-07-13 VITALS — BP 126/80 | HR 86 | Ht 69.0 in | Wt 189.3 lb

## 2016-07-13 DIAGNOSIS — I82409 Acute embolism and thrombosis of unspecified deep veins of unspecified lower extremity: Secondary | ICD-10-CM | POA: Diagnosis not present

## 2016-07-13 DIAGNOSIS — I5022 Chronic systolic (congestive) heart failure: Secondary | ICD-10-CM

## 2016-07-13 DIAGNOSIS — I10 Essential (primary) hypertension: Secondary | ICD-10-CM

## 2016-07-13 DIAGNOSIS — Z79899 Other long term (current) drug therapy: Secondary | ICD-10-CM | POA: Diagnosis not present

## 2016-07-13 DIAGNOSIS — I255 Ischemic cardiomyopathy: Secondary | ICD-10-CM

## 2016-07-13 DIAGNOSIS — Z95810 Presence of automatic (implantable) cardiac defibrillator: Secondary | ICD-10-CM

## 2016-07-13 DIAGNOSIS — Z23 Encounter for immunization: Secondary | ICD-10-CM | POA: Diagnosis not present

## 2016-07-13 LAB — CUP PACEART INCLINIC DEVICE CHECK
Battery Remaining Longevity: 0 mo
Date Time Interrogation Session: 20171108110257
HighPow Impedance: 43.4161
Implantable Lead Implant Date: 20100430
Implantable Lead Location: 753860
Implantable Lead Model: 7121
Lead Channel Pacing Threshold Amplitude: 0.75 V
Lead Channel Pacing Threshold Amplitude: 0.75 V
Lead Channel Pacing Threshold Amplitude: 1 V
Lead Channel Pacing Threshold Pulse Width: 0.5 ms
Lead Channel Pacing Threshold Pulse Width: 0.5 ms
Lead Channel Setting Pacing Amplitude: 2.5 V
Lead Channel Setting Sensing Sensitivity: 0.3 mV
MDC IDC LEAD IMPLANT DT: 20100430
MDC IDC LEAD LOCATION: 753859
MDC IDC MSMT BATTERY VOLTAGE: 2.39 V
MDC IDC MSMT LEADCHNL RA IMPEDANCE VALUE: 412.5 Ohm
MDC IDC MSMT LEADCHNL RA PACING THRESHOLD PULSEWIDTH: 0.5 ms
MDC IDC MSMT LEADCHNL RA SENSING INTR AMPL: 4.4 mV
MDC IDC MSMT LEADCHNL RV IMPEDANCE VALUE: 362.5 Ohm
MDC IDC MSMT LEADCHNL RV PACING THRESHOLD AMPLITUDE: 1 V
MDC IDC MSMT LEADCHNL RV PACING THRESHOLD PULSEWIDTH: 0.5 ms
MDC IDC MSMT LEADCHNL RV SENSING INTR AMPL: 11.7 mV
MDC IDC PG IMPLANT DT: 20100430
MDC IDC PG SERIAL: 474683
MDC IDC SET LEADCHNL RA PACING AMPLITUDE: 2 V
MDC IDC SET LEADCHNL RV PACING PULSEWIDTH: 0.5 ms
MDC IDC STAT BRADY RA PERCENT PACED: 0.18 %
MDC IDC STAT BRADY RV PERCENT PACED: 0 %

## 2016-07-13 LAB — COMPREHENSIVE METABOLIC PANEL
ALT: 10 U/L (ref 9–46)
AST: 17 U/L (ref 10–35)
Albumin: 3.9 g/dL (ref 3.6–5.1)
Alkaline Phosphatase: 110 U/L (ref 40–115)
BUN: 22 mg/dL (ref 7–25)
CALCIUM: 8.9 mg/dL (ref 8.6–10.3)
CO2: 28 mmol/L (ref 20–31)
Chloride: 99 mmol/L (ref 98–110)
Creat: 1.4 mg/dL — ABNORMAL HIGH (ref 0.70–1.33)
GLUCOSE: 314 mg/dL — AB (ref 65–99)
POTASSIUM: 4.2 mmol/L (ref 3.5–5.3)
Sodium: 137 mmol/L (ref 135–146)
Total Bilirubin: 0.4 mg/dL (ref 0.2–1.2)
Total Protein: 7.4 g/dL (ref 6.1–8.1)

## 2016-07-13 LAB — CBC
HCT: 37.1 % — ABNORMAL LOW (ref 38.5–50.0)
Hemoglobin: 11.9 g/dL — ABNORMAL LOW (ref 13.2–17.1)
MCH: 27.4 pg (ref 27.0–33.0)
MCHC: 32.1 g/dL (ref 32.0–36.0)
MCV: 85.5 fL (ref 80.0–100.0)
MPV: 12.2 fL (ref 7.5–12.5)
PLATELETS: 249 10*3/uL (ref 140–400)
RBC: 4.34 MIL/uL (ref 4.20–5.80)
RDW: 13 % (ref 11.0–15.0)
WBC: 7.5 10*3/uL (ref 3.8–10.8)

## 2016-07-13 LAB — PROTIME-INR
INR: 1
PROTHROMBIN TIME: 10.9 s (ref 9.0–11.5)

## 2016-07-13 MED ORDER — LISINOPRIL 20 MG PO TABS: 40.0000 mg | ORAL_TABLET | Freq: Every day | ORAL | 11 refills | Status: DC

## 2016-07-13 MED ORDER — FUROSEMIDE 80 MG PO TABS: 80.0000 mg | ORAL_TABLET | Freq: Two times a day (BID) | ORAL | 1 refills | Status: DC

## 2016-07-13 MED ORDER — SPIRONOLACTONE 25 MG PO TABS: 25.0000 mg | ORAL_TABLET | Freq: Every day | ORAL | 11 refills | Status: DC

## 2016-07-13 MED ORDER — ATORVASTATIN CALCIUM 80 MG PO TABS: 80.0000 mg | ORAL_TABLET | ORAL | 11 refills | Status: DC

## 2016-07-13 MED ORDER — POTASSIUM CHLORIDE ER 10 MEQ PO TBCR: 20.0000 meq | EXTENDED_RELEASE_TABLET | Freq: Every day | ORAL | 11 refills | Status: DC

## 2016-07-13 MED ORDER — DIGOXIN 125 MCG PO TABS: 0.1250 mg | ORAL_TABLET | Freq: Every day | ORAL | 11 refills | Status: DC

## 2016-07-13 MED ORDER — HYDRALAZINE HCL 25 MG PO TABS: 25.0000 mg | ORAL_TABLET | Freq: Three times a day (TID) | ORAL | 11 refills | Status: DC

## 2016-07-13 MED ORDER — ISOSORBIDE MONONITRATE ER 60 MG PO TB24: 90.0000 mg | ORAL_TABLET | Freq: Every day | ORAL | 11 refills | Status: DC

## 2016-07-13 MED ORDER — CARVEDILOL 25 MG PO TABS: 25.0000 mg | ORAL_TABLET | Freq: Two times a day (BID) | ORAL | 11 refills | Status: DC

## 2016-07-13 MED ORDER — NITROGLYCERIN 0.4 MG SL SUBL: 0.4000 mg | SUBLINGUAL_TABLET | SUBLINGUAL | 3 refills | Status: DC | PRN

## 2016-07-13 NOTE — Progress Notes (Signed)
[facility]. [address], [place]  [address] Phone: [phone] Fax:  [phone]  Date:  07/13/2016   ID:  [name], DOB [birth year], MRN [id number]  PCP:  [name]   History of Present Illness: [name] is a 50 y.o. male with a hx of DM, HTN, CAD, Ischemic Cardiomyopathy EF 20% with ST Jude ICD placed 4/10, at that time he also had a cardiac cath with patent stents. He is on chronic Coumadin therapy followed by Dr Baldemar Lenis for DVT.  Prior admission 11/17/11 after Nausea, vomiting and chest pain. Cardiac enzymes negative, BNP elevated of 2509, He was also given IV lasix and discharged on his usual dosing. Echocardiogram results with questionable vegetation on pacemaker wire vs scarring. Out Pt blood culture was obtained. and negative.  Dr. Caryl Comes adjusted ICD to only detect greater than 200 (SVT noted). 20% of the time his heart rate was greater than 100 beats per minute. TSH, CBC, BMET unremarkable. Toprol-XL 50 mg was restarted in addition to his carvedilol 25 mg twice a day. Heart rate is improved.  At prior visit, he was complaining of some increasing edema especially when standing for the majority of the day. He does have compression hose at home but he does not wear them usually. Lasix was increased to 80 mg twice a day back in February and his repeat blood work was reassuring.  On 02/05/13-[name] evidence of lower extremity edema. He has been doing very well. He did state that he had some left forearm discomfort when flexing his wrist for 2-3 days that was sensitive to the touch that felt similar to his prior blood clot in his leg that has since resolved. He is alert complaining of any arm discomfort. I carefully examined his arm it shows [name] evidence of edema or tenderness at this point. Likely musculoskeletal strain.  01/06/14-has occasional right-sided atypical sharp chest pain when lifting objects. Denies any shortness of breath. Occasional  fluctuations in weight are noted. Overall he is doing well. [name] defibrillation discharges.  01/07/15-overall doing well. NYHA class I currently. [name] symptoms. Feeling well. [name] weight gain. He does report that he has missed a few Coumadin checks. [name] syncope, [name] bleeding.  07/13/16-[name] new symptoms. [name] shortness of breath, [name] chest pain, [name] syncope, [name] bleeding. He has not seen his primary care physician in quite some time, does not have one. Expressed the importance of getting Coumadin levels checked. We will establish with our clinic. He is now working at [employer] instead of [employer]. [name]. He is seen Dr. Caryl Comes today as well, ICD change out.  Wt Readings from Last 3 Encounters:  07/13/16 189 lb 3.2 oz (85.8 kg)  01/08/15 192 lb 3.2 oz (87.2 kg)  01/07/15 192 lb (87.1 kg)     Past Medical History:  Diagnosis Date  . Angina   . ASCVD (arteriosclerotic cardiovascular disease)    , Anterior infarction 2005, LAD diagonal bifurcation intervention 03/2004  . Automatic implantable cardiac defibrillator -St. Jude's       . Benign neoplasm of colon   . Chronic systolic heart failure ([place])   . Coronary artery disease     Widely patent previously placed stents in the left anterior   . Crohn's disease ([place])   . 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 Gastroparesis   . GERD (gastroesophageal reflux disease)   . High cholesterol   . Hyperlipidemia   .  Hypersomnolent    Previous diagnosis of narcolepsy  . Hypertension, essential   . Ischemic cardiomyopathy    Ejection fraction 15-20% catheterization 2010  . Type II or unspecified type diabetes mellitus without mention of complication, not stated as uncontrolled   . Unspecified gastritis and gastroduodenitis without mention of hemorrhage     Past Surgical History:  Procedure Laterality Date  . APPENDECTOMY    . CARDIAC DEFIBRILLATOR PLACEMENT  2010   St. Jude ICD  . COLECTOMY     "for Crohn's"  . COLON SURGERY    .  FETAL SURGERY FOR CONGENITAL HERNIA    . INGUINAL HERNIA REPAIR      Current Outpatient Prescriptions  Medication Sig Dispense Refill  . aspirin 81 MG tablet Take 81 mg by mouth daily.    [name] atorvastatin (LIPITOR) 80 MG tablet Take 1 tablet (80 mg total) by mouth 3 (three) times a week. 30 tablet 3  . carvedilol (COREG) 25 MG tablet Take 25 mg by mouth 2 (two) times daily with a meal.    . digoxin (LANOXIN) 0.125 MG tablet Take 0.125 mg by mouth daily.    [name] esomeprazole (NEXIUM) 40 MG capsule Take 40 mg by mouth daily before breakfast.    . furosemide (LASIX) 80 MG tablet Take 1 tablet (80 mg total) by mouth 2 (two) times daily. 60 tablet 1  . hydrALAZINE (APRESOLINE) 25 MG tablet Take 25 mg by mouth 3 (three) times daily.    . insulin aspart (NOVOLOG) 100 UNIT/ML injection Inject 10 Units into the skin 3 (three) times daily before meals. Take 10-25 units into the skin 3 times daily.    . insulin glargine (LANTUS) 100 UNIT/ML injection Inject 100 Units into the skin as directed. Sliding scale.    . isosorbide mononitrate (IMDUR) 60 MG 24 hr tablet Take 90 mg by mouth daily.    [name] lisinopril (PRINIVIL,ZESTRIL) 20 MG tablet Take 40 mg by mouth daily.    . magnesium oxide (MAG-OX) 400 MG tablet Take 400 mg by mouth daily.    . nitroGLYCERIN (NITROSTAT) 0.4 MG SL tablet Place 0.4 mg under the tongue every 5 (five) minutes as needed for chest pain.    [name] ondansetron (ZOFRAN) 4 MG tablet Take 4 mg by mouth as needed for nausea.    . potassium chloride (K-DUR) 10 MEQ tablet Take 20 mEq by mouth daily.     [name] spironolactone (ALDACTONE) 25 MG tablet Take 25 mg by mouth daily.    [name] warfarin (COUMADIN) 7.5 MG tablet Take 7.5 mg by mouth daily.    . metoCLOPramide (REGLAN) 10 MG tablet Take 1 tablet (10 mg total) by mouth 4 (four) times daily -  before meals and at bedtime. 120 tablet 0   [name] current facility-administered medications for this visit.     Allergies:    Allergies  Allergen Reactions  .  Metformin And Related Diarrhea    Social History:  The [name]  reports that he has never smoked. He has never used smokeless tobacco. He reports that he does not drink alcohol or use drugs.   Family History  Problem Relation Age of Onset  . Heart attack Father   . Stroke Paternal Grandfather     ROS:  Please see the history of present illness.   Denies any fevers, chills, orthopnea, PND, syncope   All other systems reviewed and negative.   PHYSICAL EXAM: VS:  BP 126/80   Pulse 86   Ht 5'  9" (1.753 m)   Wt 189 lb 3.2 oz (85.8 kg)   BMI 27.94 kg/m  Well nourished, well developed, in [name] acute distress  HEENT: normal, [place]/AT, EOMI Neck: [name] JVD, normal carotid upstroke, [name] bruit Cardiac:  normal S1, S2; RRR; [name] murmur Lungs:  clear to auscultation bilaterally, [name] wheezing, rhonchi or rales  Abd: soft, nontender, [name] hepatomegaly, [name] bruits  Ext: [name] edema, 2+ distal pulses Skin: warm and dry  GU: deferred Neuro: [name] focal abnormalities noted, AAO x 3  EKG:  EKG done today 07/13/16-sinus rhythm, 86, poor R-wave progression, [name] other significant abnormalities. Personally viewed-prior 01/07/15-normal sinus rhythm, 63, anteroseptal infarct age undetermined, poor R-wave progression . 01/06/14-sinus rhythm, poor R wave progression, nonspecific ST-T wave change with T-wave inversion in V5, V6, 1, aVL. [name] significant change from prior.      ASSESSMENT AND PLAN:  1. Chronic systolic heart failure-currently well compensated. Multiple drugs reviewed. Excellent use of medications. [name] changes made. Checking CBC and complete metabolic profile. Beta blocker, ACE inhibitor, spironolactone, hydralazine, nitrates.Dig level <0.1 previously. 2. Chronic anticoagulation with warfarin because of recurrent lower extremity venous thrombosis. Will establish with Coumadin clinic here. He has not seen a primary care physician in quite some time. [name] bleeding. 3. Hypertension-normal today. Multidrug regimen. Monitor.  Checking labs. 4. ICD-he states that he is ready for change out, seeing Dr. Caryl Comes today as well. Prior note by Dr. Caryl Comes reviewed.  5. CAD-previously patent stents 6. Cardiomyopathy-EF 15-20% echo 2013. 7. 6 month follow up. Daily weights. He knows to call if any symptoms are developing. Flu shot today Signed, Candee Furbish, MD [facility]  07/13/2016 9:42 AM

## 2016-07-13 NOTE — Patient Instructions (Signed)
Medication Instructions:  The current medical regimen is effective;  continue present plan and medications.  Labwork: Please have blood work today (CMP, CBC and INR)  Follow-Up: You need to establish with the Coumadin Clinic to follow your INR due to recurrent DVT.  Follow up in 6 months with Dr. Marlou Porch.  You will receive a letter in the mail 2 months before you are due.  Please call us when you receive this letter to schedule your follow up appointment.  If you need a refill on your cardiac medications before your next appointment, please call your pharmacy.  Thank you for choosing [facility]!!

## 2016-07-13 NOTE — Progress Notes (Signed)
Patient Care Team: No Pcp Per Patient as PCP - General (General Practice)   HPI  [name] is a 50 y.o. male Seen In followup for an ICD implanted 2010 for primary prevention. This was done in the setting of prior MI, prior stenting and ischemic cardiomyopathy with an ejection fraction of 15-20% and patent stents .  His device has reached ERI.  He is on guideline directed medical therapy and has no complaints of chest pain shortness of breath lightheadedness or palpitations syncope or ICD discharge; he does have some chronic stable lower extremity edema    Labs were drawn this morning but digoxin level was not included.  Past Medical History:  Diagnosis Date  . Angina   . ASCVD (arteriosclerotic cardiovascular disease)    , Anterior infarction 2005, LAD diagonal bifurcation intervention 03/2004  . Automatic implantable cardiac defibrillator -St. Jude's       . Benign neoplasm of colon   . Chronic systolic heart failure ([place])   . Coronary artery disease     Widely patent previously placed stents in the left anterior   . Crohn's disease ([place])   . 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 Gastroparesis   . GERD (gastroesophageal reflux disease)   . High cholesterol   . Hyperlipidemia   . Hypersomnolent    Previous diagnosis of narcolepsy  . Hypertension, essential   . Ischemic cardiomyopathy    Ejection fraction 15-20% catheterization 2010  . Type II or unspecified type diabetes mellitus without mention of complication, not stated as uncontrolled   . Unspecified gastritis and gastroduodenitis without mention of hemorrhage     Past Surgical History:  Procedure Laterality Date  . APPENDECTOMY    . CARDIAC DEFIBRILLATOR PLACEMENT  2010   St. Jude ICD  . COLECTOMY     "for Crohn's"  . COLON SURGERY    . FETAL SURGERY FOR CONGENITAL HERNIA    . INGUINAL HERNIA REPAIR      Current Outpatient Prescriptions  Medication Sig Dispense Refill    . aspirin 81 MG tablet Take 81 mg by mouth daily.    [name] atorvastatin (LIPITOR) 80 MG tablet Take 1 tablet (80 mg total) by mouth 3 (three) times a week. 30 tablet 11  . carvedilol (COREG) 25 MG tablet Take 1 tablet (25 mg total) by mouth 2 (two) times daily with a meal. 60 tablet 11  . digoxin (LANOXIN) 0.125 MG tablet Take 1 tablet (0.125 mg total) by mouth daily. 30 tablet 11  . esomeprazole (NEXIUM) 40 MG capsule Take 40 mg by mouth daily before breakfast.    . furosemide (LASIX) 80 MG tablet Take 1 tablet (80 mg total) by mouth 2 (two) times daily. 60 tablet 1  . hydrALAZINE (APRESOLINE) 25 MG tablet Take 1 tablet (25 mg total) by mouth 3 (three) times daily. 90 tablet 11  . insulin aspart (NOVOLOG) 100 UNIT/ML injection Inject 10 Units into the skin 3 (three) times daily before meals. Take 10-25 units into the skin 3 times daily.    . insulin glargine (LANTUS) 100 UNIT/ML injection Inject 100 Units into the skin as directed. Sliding scale.    . isosorbide mononitrate (IMDUR) 60 MG 24 hr tablet Take 1.5 tablets (90 mg total) by mouth daily. 45 tablet 11  . lisinopril (PRINIVIL,ZESTRIL) 20 MG tablet Take 2 tablets (40 mg total) by mouth daily. 60 tablet 11  . magnesium oxide (MAG-OX) 400 MG tablet  Take 400 mg by mouth daily.    . nitroGLYCERIN (NITROSTAT) 0.4 MG SL tablet Place 1 tablet (0.4 mg total) under the tongue every 5 (five) minutes as needed for chest pain. 25 tablet 3  . ondansetron (ZOFRAN) 4 MG tablet Take 4 mg by mouth as needed for nausea.    . potassium chloride (K-DUR) 10 MEQ tablet Take 2 tablets (20 mEq total) by mouth daily. 60 tablet 11  . spironolactone (ALDACTONE) 25 MG tablet Take 1 tablet (25 mg total) by mouth daily. 30 tablet 11  . warfarin (COUMADIN) 7.5 MG tablet Take 7.5 mg by mouth daily.     No current facility-administered medications for this visit.     Allergies  Allergen Reactions  . Metformin And Related Diarrhea    Review of Systems negative except  from HPI and PMH  Physical Exam BP 126/80   Pulse 86   Ht 5' 9"  (1.753 m)   Wt 189 lb 4.8 oz (85.9 kg)   BMI 27.95 kg/m  Well developed and well nourished in no acute distress HENT normal E scleral and icterus clear Neck Supple JVP flat; carotids brisk and full Clear to ausculation Device pocket well healed; without hematoma or erythema.  There is no tethering  regular rate and rhythm, no murmurs gallops or rub Soft with active bowel sounds No clubbing cyanosis  Edema Alert and oriented, grossly normal motor and sensory function Skin Warm and Dry  ECG demonstrated sinus rhythm at 86 Intervals 18/08/39  Assessment and  Plan  Hypertension  Ischemic cardiomyopathy  Without symptoms of ischemia  Congestive heart failure-chronic systolic  Euvolemic continue current meds  Implantable defibrillator - St Judes   device is at [employer] continue current meds  Will check dig level at time of preop   We have reviewed the benefits and risks of generator replacement.  These include but are not limited to lead fracture and infection.  The patient understands, agrees and is willing to proceed.    Will recheck echo.

## 2016-07-13 NOTE — Patient Instructions (Signed)
Medication Instructions: - Your physician recommends that you continue on your current medications as directed. Please refer to the Current Medication list given to you today.  Labwork: - none ordered today  Procedures/Testing: - Your physician has requested that you have an echocardiogram. Echocardiography is a painless test that uses sound waves to create images of your heart. It provides your doctor with information about the size and shape of your heart and how well your heart's chambers and valves are working. This procedure takes approximately one hour. There are no restrictions for this procedure.  - Your physician has recommended that you have a defibrillator generator (battery) change.  Available dates for January are: 5th, 10th, 15th  Please call Nira Conn, RN for Dr. Caryl Comes when you decide on which date works the best for you- [phone]) [phone].  Follow-Up: - pending procedure date  Any Additional Special Instructions Will Be Listed Below (If Applicable).     If you need a refill on your cardiac medications before your next appointment, please call your pharmacy.

## 2016-08-03 ENCOUNTER — Other Ambulatory Visit: Payer: Self-pay

## 2016-08-03 ENCOUNTER — Other Ambulatory Visit (HOSPITAL_COMMUNITY): Payer: BLUE CROSS/BLUE SHIELD

## 2016-08-03 ENCOUNTER — Ambulatory Visit (INDEPENDENT_AMBULATORY_CARE_PROVIDER_SITE_OTHER): Payer: BLUE CROSS/BLUE SHIELD | Admitting: *Deleted

## 2016-08-03 ENCOUNTER — Ambulatory Visit (HOSPITAL_COMMUNITY): Payer: BLUE CROSS/BLUE SHIELD | Attending: Cardiology

## 2016-08-03 ENCOUNTER — Telehealth: Payer: Self-pay

## 2016-08-03 DIAGNOSIS — Z5181 Encounter for therapeutic drug level monitoring: Secondary | ICD-10-CM | POA: Diagnosis not present

## 2016-08-03 DIAGNOSIS — I255 Ischemic cardiomyopathy: Secondary | ICD-10-CM | POA: Diagnosis not present

## 2016-08-03 DIAGNOSIS — I517 Cardiomegaly: Secondary | ICD-10-CM | POA: Diagnosis not present

## 2016-08-03 DIAGNOSIS — I429 Cardiomyopathy, unspecified: Secondary | ICD-10-CM | POA: Diagnosis present

## 2016-08-03 DIAGNOSIS — R931 Abnormal findings on diagnostic imaging of heart and coronary circulation: Secondary | ICD-10-CM

## 2016-08-03 DIAGNOSIS — I340 Nonrheumatic mitral (valve) insufficiency: Secondary | ICD-10-CM | POA: Diagnosis not present

## 2016-08-03 DIAGNOSIS — I82499 Acute embolism and thrombosis of other specified deep vein of unspecified lower extremity: Secondary | ICD-10-CM

## 2016-08-03 LAB — ECHOCARDIOGRAM COMPLETE
CHL CUP MV DEC (S): 120
CHL CUP TV REG PEAK VELOCITY: 346 cm/s
EERAT: 14.27
EWDT: 120 ms
FS: 15 % — AB (ref 28–44)
IVS/LV PW RATIO, ED: 0.68
LA diam end sys: 43 mm
LA diam index: 2.08 cm/m2
LA vol A4C: 48 ml
LA vol: 67 mL
LASIZE: 43 mm
LAVOLIN: 32.5 mL/m2
LDCA: 3.14 cm2
LV E/e'average: 14.27
LV PW d: 11.8 mm — AB (ref 0.6–1.1)
LV TDI E'LATERAL: 6.47
LV TDI E'MEDIAL: 5.26
LVEEMED: 14.27
LVELAT: 6.47 cm/s
LVOT VTI: 13 cm
LVOTD: 20 mm
LVOTPV: 77.5 cm/s
LVOTSV: 41 mL
Lateral S' vel: 10.5 cm/s
MV pk E vel: 92.3 m/s
MVPG: 3 mmHg
MVPKAVEL: 44.4 m/s
Peak grad: 346 mmHg
TR max vel: 346 cm/s

## 2016-08-03 LAB — POCT INR: INR: 1.1

## 2016-08-03 MED ORDER — WARFARIN SODIUM 5 MG PO TABS: 5.0000 mg | ORAL_TABLET | Freq: Every day | ORAL | 1 refills | Status: DC

## 2016-08-03 NOTE — Addendum Note (Signed)
Addended by: Harland German A on: 08/03/2016 05:27 PM   Modules accepted: Orders

## 2016-08-03 NOTE — Telephone Encounter (Addendum)
Patient in today for ECHO.  Per echo technician, there is a possible thrombus. There were several attempts to start IV for Difinity, but all attempts were unsuccessful. Per Dr. Marlou Porch, limited echo ordered to be done at the hospital with IV team to start access prior to test. Test ordered for scheduling. The patient requests his daughter, Graviel Payeur, to be called to schedule (phone [phone]).  IV team will be notified once scheduled to arrange (IV team telephone [phone]).   Patient is concerned about having his defibrillator changed out. He states when it was placed, he had a "traumatic experience" with the IV prior to having it placed. He requests to have a plan in place this time so he isn't traumatized since "no one can start the IV." He reports last time, the MD placed the OV in the OR and was the only one able.  To Dr. Caryl Comes and his nurse.

## 2016-08-03 NOTE — Patient Instructions (Signed)
A full discussion of the nature of anticoagulants has been carried out.  A benefit risk analysis has been presented to the patient, so that they understand the justification for choosing anticoagulation at this time. The need for frequent and regular monitoring, precise dosage adjustment and compliance is stressed.  Side effects of potential bleeding are discussed.  The patient should avoid any OTC items containing aspirin or ibuprofen, and should avoid great swings in general diet.  Avoid alcohol consumption.  Call if any signs of abnormal bleeding.    

## 2016-08-04 ENCOUNTER — Ambulatory Visit (HOSPITAL_COMMUNITY)
Admission: RE | Admit: 2016-08-04 | Discharge: 2016-08-04 | Disposition: A | Discharge status: Home / Self Care | Payer: BLUE CROSS/BLUE SHIELD | Source: Ambulatory Visit | Attending: Cardiology | Admitting: Cardiology

## 2016-08-04 DIAGNOSIS — I110 Hypertensive heart disease with heart failure: Secondary | ICD-10-CM | POA: Diagnosis not present

## 2016-08-04 DIAGNOSIS — R29898 Other symptoms and signs involving the musculoskeletal system: Secondary | ICD-10-CM | POA: Diagnosis not present

## 2016-08-04 DIAGNOSIS — R931 Abnormal findings on diagnostic imaging of heart and coronary circulation: Secondary | ICD-10-CM | POA: Diagnosis not present

## 2016-08-04 DIAGNOSIS — I509 Heart failure, unspecified: Secondary | ICD-10-CM | POA: Diagnosis not present

## 2016-08-04 MED ORDER — PERFLUTREN LIPID MICROSPHERE
1.0000 mL | INTRAVENOUS | Status: AC | PRN
  Administered 2016-08-04: 2 mL via INTRAVENOUS
  Filled 2016-08-04: qty 10

## 2016-08-04 NOTE — Progress Notes (Signed)
  Echocardiogram 2D Echocardiogram has been performed.  Donata Clay 08/04/2016, 3:05 PM

## 2016-08-04 NOTE — Telephone Encounter (Signed)
Pt has been scheduled for echo with Difinity at the hospital (for IV access) today at 12N.

## 2016-08-10 ENCOUNTER — Ambulatory Visit (INDEPENDENT_AMBULATORY_CARE_PROVIDER_SITE_OTHER): Payer: BLUE CROSS/BLUE SHIELD | Admitting: Pharmacist

## 2016-08-10 DIAGNOSIS — Z5181 Encounter for therapeutic drug level monitoring: Secondary | ICD-10-CM

## 2016-08-10 DIAGNOSIS — I82499 Acute embolism and thrombosis of other specified deep vein of unspecified lower extremity: Secondary | ICD-10-CM

## 2016-08-10 LAB — POCT INR: INR: 3

## 2016-08-20 ENCOUNTER — Inpatient Hospital Stay (HOSPITAL_COMMUNITY)
Admission: EM | Admit: 2016-08-20 | Discharge: 2016-08-22 | DRG: 291 | Disposition: A | Discharge status: Home / Self Care | Payer: BLUE CROSS/BLUE SHIELD | Attending: Family Medicine | Admitting: Family Medicine

## 2016-08-20 ENCOUNTER — Emergency Department (HOSPITAL_COMMUNITY): Payer: BLUE CROSS/BLUE SHIELD

## 2016-08-20 ENCOUNTER — Encounter (HOSPITAL_COMMUNITY): Payer: Self-pay | Admitting: *Deleted

## 2016-08-20 DIAGNOSIS — Z7982 Long term (current) use of aspirin: Secondary | ICD-10-CM | POA: Diagnosis not present

## 2016-08-20 DIAGNOSIS — K219 Gastro-esophageal reflux disease without esophagitis: Secondary | ICD-10-CM | POA: Diagnosis present

## 2016-08-20 DIAGNOSIS — Z7901 Long term (current) use of anticoagulants: Secondary | ICD-10-CM | POA: Diagnosis not present

## 2016-08-20 DIAGNOSIS — I2510 Atherosclerotic heart disease of native coronary artery without angina pectoris: Secondary | ICD-10-CM | POA: Diagnosis present

## 2016-08-20 DIAGNOSIS — I249 Acute ischemic heart disease, unspecified: Secondary | ICD-10-CM | POA: Diagnosis not present

## 2016-08-20 DIAGNOSIS — I130 Hypertensive heart and chronic kidney disease with heart failure and stage 1 through stage 4 chronic kidney disease, or unspecified chronic kidney disease: Principal | ICD-10-CM | POA: Diagnosis present

## 2016-08-20 DIAGNOSIS — Z95810 Presence of automatic (implantable) cardiac defibrillator: Secondary | ICD-10-CM | POA: Diagnosis not present

## 2016-08-20 DIAGNOSIS — R7989 Other specified abnormal findings of blood chemistry: Secondary | ICD-10-CM

## 2016-08-20 DIAGNOSIS — N189 Chronic kidney disease, unspecified: Secondary | ICD-10-CM | POA: Diagnosis present

## 2016-08-20 DIAGNOSIS — I5023 Acute on chronic systolic (congestive) heart failure: Secondary | ICD-10-CM

## 2016-08-20 DIAGNOSIS — E1122 Type 2 diabetes mellitus with diabetic chronic kidney disease: Secondary | ICD-10-CM | POA: Diagnosis present

## 2016-08-20 DIAGNOSIS — Z86718 Personal history of other venous thrombosis and embolism: Secondary | ICD-10-CM | POA: Diagnosis not present

## 2016-08-20 DIAGNOSIS — E119 Type 2 diabetes mellitus without complications: Secondary | ICD-10-CM

## 2016-08-20 DIAGNOSIS — I255 Ischemic cardiomyopathy: Secondary | ICD-10-CM | POA: Diagnosis present

## 2016-08-20 DIAGNOSIS — Z823 Family history of stroke: Secondary | ICD-10-CM

## 2016-08-20 DIAGNOSIS — E1143 Type 2 diabetes mellitus with diabetic autonomic (poly)neuropathy: Secondary | ICD-10-CM | POA: Diagnosis present

## 2016-08-20 DIAGNOSIS — Z888 Allergy status to other drugs, medicaments and biological substances status: Secondary | ICD-10-CM

## 2016-08-20 DIAGNOSIS — Z794 Long term (current) use of insulin: Secondary | ICD-10-CM

## 2016-08-20 DIAGNOSIS — Z9049 Acquired absence of other specified parts of digestive tract: Secondary | ICD-10-CM | POA: Diagnosis not present

## 2016-08-20 DIAGNOSIS — K5090 Crohn's disease, unspecified, without complications: Secondary | ICD-10-CM | POA: Diagnosis present

## 2016-08-20 DIAGNOSIS — I5043 Acute on chronic combined systolic (congestive) and diastolic (congestive) heart failure: Secondary | ICD-10-CM

## 2016-08-20 DIAGNOSIS — K3184 Gastroparesis: Secondary | ICD-10-CM | POA: Diagnosis present

## 2016-08-20 DIAGNOSIS — Z79899 Other long term (current) drug therapy: Secondary | ICD-10-CM | POA: Diagnosis not present

## 2016-08-20 DIAGNOSIS — E785 Hyperlipidemia, unspecified: Secondary | ICD-10-CM | POA: Diagnosis present

## 2016-08-20 DIAGNOSIS — I10 Essential (primary) hypertension: Secondary | ICD-10-CM | POA: Diagnosis not present

## 2016-08-20 DIAGNOSIS — D649 Anemia, unspecified: Secondary | ICD-10-CM | POA: Diagnosis present

## 2016-08-20 DIAGNOSIS — G47419 Narcolepsy without cataplexy: Secondary | ICD-10-CM | POA: Diagnosis present

## 2016-08-20 DIAGNOSIS — Z8249 Family history of ischemic heart disease and other diseases of the circulatory system: Secondary | ICD-10-CM

## 2016-08-20 LAB — GLUCOSE, CAPILLARY
GLUCOSE-CAPILLARY: 264 mg/dL — AB (ref 65–99)
Glucose-Capillary: 131 mg/dL — ABNORMAL HIGH (ref 65–99)

## 2016-08-20 LAB — CBC
HCT: 32.3 % — ABNORMAL LOW (ref 39.0–52.0)
Hemoglobin: 10.5 g/dL — ABNORMAL LOW (ref 13.0–17.0)
MCH: 26.6 pg (ref 26.0–34.0)
MCHC: 32.5 g/dL (ref 30.0–36.0)
MCV: 81.8 fL (ref 78.0–100.0)
PLATELETS: 276 10*3/uL (ref 150–400)
RBC: 3.95 MIL/uL — AB (ref 4.22–5.81)
RDW: 13.8 % (ref 11.5–15.5)
WBC: 7.2 10*3/uL (ref 4.0–10.5)

## 2016-08-20 LAB — DIFFERENTIAL
Basophils Absolute: 0 10*3/uL (ref 0.0–0.1)
Basophils Relative: 0 %
EOS ABS: 0.1 10*3/uL (ref 0.0–0.7)
EOS PCT: 2 %
LYMPHS ABS: 1.6 10*3/uL (ref 0.7–4.0)
Lymphocytes Relative: 23 %
MONO ABS: 0.4 10*3/uL (ref 0.1–1.0)
Monocytes Relative: 5 %
NEUTROS PCT: 70 %
Neutro Abs: 5.1 10*3/uL (ref 1.7–7.7)

## 2016-08-20 LAB — BASIC METABOLIC PANEL
Anion gap: 6 (ref 5–15)
BUN: 21 mg/dL — AB (ref 6–20)
CALCIUM: 8.7 mg/dL — AB (ref 8.9–10.3)
CO2: 28 mmol/L (ref 22–32)
CREATININE: 1.25 mg/dL — AB (ref 0.61–1.24)
Chloride: 103 mmol/L (ref 101–111)
GFR calc non Af Amer: >60 mL/min (ref >60)
Glucose, Bld: 240 mg/dL — ABNORMAL HIGH (ref 65–99)
Potassium: 4 mmol/L (ref 3.5–5.1)
SODIUM: 137 mmol/L (ref 135–145)

## 2016-08-20 LAB — PROTIME-INR
INR: 1.92
Prothrombin Time: 22.2 seconds — ABNORMAL HIGH (ref 11.4–15.2)

## 2016-08-20 LAB — I-STAT TROPONIN, ED: TROPONIN I, POC: 0.05 ng/mL (ref 0.00–0.08)

## 2016-08-20 LAB — TROPONIN I
Troponin I: 0.05 ng/mL (ref <0.03)
Troponin I: 0.04 ng/mL (ref <0.03)

## 2016-08-20 LAB — BRAIN NATRIURETIC PEPTIDE: B NATRIURETIC PEPTIDE 5: 1536.9 pg/mL — AB (ref 0.0–100.0)

## 2016-08-20 MED ORDER — INSULIN GLARGINE 100 UNIT/ML ~~LOC~~ SOLN: 100.0000 [IU] | Freq: Every day | SUBCUTANEOUS | Status: DC

## 2016-08-20 MED ORDER — ASPIRIN 81 MG PO CHEW
81.0000 mg | CHEWABLE_TABLET | Freq: Every day | ORAL | Status: DC
  Administered 2016-08-20 – 2016-08-22 (×3): 81 mg via ORAL
  Filled 2016-08-20 (×4): qty 1

## 2016-08-20 MED ORDER — FUROSEMIDE 10 MG/ML IJ SOLN
80.0000 mg | Freq: Once | INTRAMUSCULAR | Status: AC
  Administered 2016-08-20: 80 mg via INTRAVENOUS
  Filled 2016-08-20: qty 8

## 2016-08-20 MED ORDER — WARFARIN SODIUM 5 MG PO TABS
5.0000 mg | ORAL_TABLET | Freq: Once | ORAL | Status: AC
  Administered 2016-08-20: 5 mg via ORAL
  Filled 2016-08-20: qty 1

## 2016-08-20 MED ORDER — CARVEDILOL 25 MG PO TABS
25.0000 mg | ORAL_TABLET | Freq: Two times a day (BID) | ORAL | Status: DC
  Administered 2016-08-20 – 2016-08-22 (×5): 25 mg via ORAL
  Filled 2016-08-20 (×5): qty 1

## 2016-08-20 MED ORDER — SPIRONOLACTONE 25 MG PO TABS
25.0000 mg | ORAL_TABLET | Freq: Every day | ORAL | Status: DC
  Administered 2016-08-21 – 2016-08-22 (×2): 25 mg via ORAL
  Filled 2016-08-20 (×3): qty 1

## 2016-08-20 MED ORDER — PANTOPRAZOLE SODIUM 40 MG PO TBEC
40.0000 mg | DELAYED_RELEASE_TABLET | Freq: Every day | ORAL | Status: DC
  Administered 2016-08-21 – 2016-08-22 (×2): 40 mg via ORAL
  Filled 2016-08-20 (×3): qty 1

## 2016-08-20 MED ORDER — ISOSORBIDE MONONITRATE ER 60 MG PO TB24
90.0000 mg | ORAL_TABLET | Freq: Every day | ORAL | Status: DC
  Administered 2016-08-21 – 2016-08-22 (×2): 90 mg via ORAL
  Filled 2016-08-20 (×4): qty 1

## 2016-08-20 MED ORDER — LISINOPRIL 40 MG PO TABS
40.0000 mg | ORAL_TABLET | Freq: Every day | ORAL | Status: DC
  Filled 2016-08-20: qty 1

## 2016-08-20 MED ORDER — INSULIN ASPART 100 UNIT/ML ~~LOC~~ SOLN
0.0000 [IU] | Freq: Three times a day (TID) | SUBCUTANEOUS | Status: DC
  Administered 2016-08-20: 2 [IU] via SUBCUTANEOUS
  Administered 2016-08-21: 8 [IU] via SUBCUTANEOUS
  Administered 2016-08-21: 2 [IU] via SUBCUTANEOUS
  Administered 2016-08-21 – 2016-08-22 (×2): 5 [IU] via SUBCUTANEOUS
  Administered 2016-08-22: 11 [IU] via SUBCUTANEOUS

## 2016-08-20 MED ORDER — SODIUM CHLORIDE 0.9% FLUSH
3.0000 mL | Freq: Two times a day (BID) | INTRAVENOUS | Status: DC
  Administered 2016-08-20 – 2016-08-22 (×4): 3 mL via INTRAVENOUS

## 2016-08-20 MED ORDER — HYDRALAZINE HCL 25 MG PO TABS
25.0000 mg | ORAL_TABLET | Freq: Three times a day (TID) | ORAL | Status: DC
  Administered 2016-08-20 – 2016-08-22 (×6): 25 mg via ORAL
  Filled 2016-08-20 (×8): qty 1

## 2016-08-20 MED ORDER — DIGOXIN 125 MCG PO TABS
0.1250 mg | ORAL_TABLET | Freq: Every day | ORAL | Status: DC
  Administered 2016-08-20 – 2016-08-22 (×3): 0.125 mg via ORAL
  Filled 2016-08-20 (×4): qty 1

## 2016-08-20 MED ORDER — ATORVASTATIN CALCIUM 80 MG PO TABS
80.0000 mg | ORAL_TABLET | Freq: Every day | ORAL | Status: DC
  Administered 2016-08-20 – 2016-08-22 (×3): 80 mg via ORAL
  Filled 2016-08-20 (×3): qty 1

## 2016-08-20 MED ORDER — WARFARIN - PHARMACIST DOSING INPATIENT: Freq: Every day | Status: DC

## 2016-08-20 NOTE — ED Notes (Signed)
Called lab to check on BNP. They state they will begin running BNP now.

## 2016-08-20 NOTE — H&P (Signed)
[facility] Admission History and Physical Service Pager: [phone]  Patient name: [name] Medical record number: [id number] Date of birth: [birth year] Age: 50 y.o. Gender: male  Primary Care Provider: No PCP Per Patient Consultants: None  Code Status: Full   Chief Complaint: Dyspnea   Assessment and Plan: [name] is a 50 y.o. male presenting with dyspnea . PMH is significant for HLD, gasteroparesis, T2DM, DVT (x4),  CAD, ICD, crohns, HTN   Dyspnea: Differential diagnosis includes ACS, pulmonary embolism, heart failure. History significant for PND, orthopnea, bilateral leg swelling of 1 to 2 weeks duration. Wells criteria are for pulmonary embolism 1.5 as patient has had a history of 4 DVTs, otherwise no signs or symptoms of PE therefore low probability. No concern for chest pain or recent history of chest pain, though with hx of CAD and other risk factors would rule out ACS. Troponin 0.04. EKG similar previous, no acute changes. Likely heart failure given patient report symptoms and findings of 15 lbs weight gain.  BNP 1,536.9. CXR without edema, though 2+ pitting edema in bilateral legs. Last echo on 08/04/16 with an ejection fraction of 30%.  - Will admit to teaching service, telemetry for diuresis, attending Dr. McDiramid - Received 80 IV lasix in the ED, will give a second dose of 80 IV lasix  - Strict I& Os   - Daily weights - Trend troponin   - EKG in the AM  - Vitals per floor  - TED hose placed   HFrEF: ECHO with EF 30% with severe hypokinesis of  inferior, inferoseptal, posterior walls; apical akinesis. ICD implanted in 2011. Followed by Dr. Marlou Porch and Dr. Caryl Comes, last seen on November 8th 2017. States his dry weight is 185 lbs, current weight 201 lbs.   - Diuresis as above  -  I&Os  -  Continue home meds : IMDUR, Coreg 25 BID, Lisinopril 40 mg, Spironolactone 25 mg, Digoxin 0.125 mg  - Consider get a digoxin level    T2DM: Home  Insulin: Lantus 100 units. Novolog 3-4 units with meals. Has not taken any insulin in the last month. Per pharmacy last received insulin in 2013. 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 CKD: Unable to determine baseline with so few data points. May in fact be patient's baseline  - Will hold lisinopril for now  - Avoid nephrotoxic agents  - BMET in the AM    FEN/GI: Diet Heart health Prophylaxis: Warfarin   Disposition: Fluid status improved   History of Present Illness:  [name] is a 50 y.o. male presenting with an week of worsening dyspnea and orthopnea. He states that about 2 weeks ago he began developing swelling in his legs that did not go done overnight. Then about one week ago he became more dyspneic with exertion. He noticed that when he went to sleep he needed several pillows to sleep at night due to shortness of breath. He states that he woke up last week gasping for air and had to sit up in order  to recover. He increased his Lasix from 160 mg to 240 mg last week. However did not see a significant increase in his urine output. He follows closely however with Dr.Skaines and Caryl Comes and states that he saw cardiology last month. He states that his dry weight is normally 185 pounds and that he has been 15 lbs over the past week. Patient denies any other symptoms or concerns. He states that he did have a bit of a cough last week however this is now resolved. He denies any increase in his salt intake or any changes in his diet. He does not believe that he has increased his fluid intake. He was last admitted for heart failure exacerbation in 2013. He  does not have a PCP and not been using his insulin regularly for the past month. Denies any chest pain, palpations, or nausea.   Review Of Systems: Per HPI with the following additions:  Review of Systems  Constitutional: Negative for chills and fever.  HENT: Negative for ear discharge and ear pain.   Eyes: Negative for blurred vision and double vision.  Respiratory: Positive for shortness of breath. Negative for cough and sputum production.   Cardiovascular: Positive for orthopnea, leg swelling and PND. Negative for chest pain and palpitations.  Gastrointestinal: Negative for abdominal pain, nausea and vomiting.  Genitourinary: Negative for dysuria and frequency.  Neurological: Negative for sensory change and focal weakness.  Psychiatric/Behavioral: Negative for depression. The patient is not nervous/anxious.     Patient Active Problem List   Diagnosis Date Noted  . CHF exacerbation ([place]) 08/20/2016  . Encounter for therapeutic drug monitoring 08/03/2016  . Chest pain 04/26/2012  . SVT (supraventricular tachycardia) ([place]) 04/26/2012  . Gastroparesis due to DM ([place]) 11/18/2011  . Nausea and vomiting in adult 11/17/2011  . Chronic systolic heart failure ([place]) 11/17/2011  . Coronary artery disease   . Ischemic cardiomyopathy   . Hypertension, essential   . Automatic implantable cardioverter-defibrillator in situ   . Deep venous thrombosis ([place])   . Hypersomnolent   . POLYP, COLON 09/25/2008  . DM 09/25/2008  . HYPERLIPIDEMIA 09/25/2008  . GASTRITIS 09/25/2008  . DIVERTICULOSIS, COLON 09/25/2008  . RECTAL MASS 09/25/2008    Past Medical History: Past Medical History:  Diagnosis Date  . Angina   . ASCVD (arteriosclerotic cardiovascular disease)    , Anterior infarction 2005, LAD diagonal bifurcation intervention 03/2004  . Automatic implantable cardiac defibrillator -[employer]       . Benign neoplasm of colon   . Chronic systolic heart failure ([place])   . Coronary artery  disease     Widely patent previously placed stents in the left anterior   . Crohn's disease ([place])   . 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 Gastroparesis   . GERD (gastroesophageal reflux disease)   . High cholesterol   . Hyperlipidemia   . Hypersomnolent    Previous diagnosis of narcolepsy  . Hypertension, essential   . Ischemic cardiomyopathy    Ejection fraction 15-20% catheterization 2010  . Type II or unspecified type diabetes mellitus without mention of complication, not stated as uncontrolled   . Unspecified gastritis and gastroduodenitis without mention of hemorrhage     Past Surgical History: Past Surgical History:  Procedure Laterality Date  . APPENDECTOMY    . CARDIAC DEFIBRILLATOR PLACEMENT  2010   [employer] Jude ICD  . COLECTOMY     "for Crohn's"  . COLON SURGERY    . FETAL  SURGERY FOR CONGENITAL HERNIA    . INGUINAL HERNIA REPAIR      Social History: Social History  Substance Use Topics  . Smoking status: Never Smoker  . Smokeless tobacco: Never Used  . Alcohol use No   Additional social history: Please also refer to relevant sections of EMR.  Family History: Family History  Problem Relation Age of Onset  . Heart attack Father   . Stroke Paternal Grandfather    Allergies and Medications: Allergies  Allergen Reactions  . Metformin And Related Diarrhea   No current facility-administered medications on file prior to encounter.    Current Outpatient Prescriptions on File Prior to Encounter  Medication Sig Dispense Refill  . aspirin 81 MG tablet Take 81 mg by mouth daily.    [name] atorvastatin (LIPITOR) 80 MG tablet Take 1 tablet (80 mg total) by mouth 3 (three) times a week. (Patient taking differently: Take 80 mg by mouth daily. ) 30 tablet 11  . carvedilol (COREG) 25 MG tablet Take 1 tablet (25 mg total) by mouth 2 (two) times daily with a meal. 60 tablet 11  . digoxin (LANOXIN) 0.125 MG tablet Take 1 tablet (0.125 mg total) by mouth  daily. 30 tablet 11  . furosemide (LASIX) 80 MG tablet Take 1 tablet (80 mg total) by mouth 2 (two) times daily. (Patient taking differently: Take 80-160 mg by mouth 2 (two) times daily. ) 60 tablet 1  . hydrALAZINE (APRESOLINE) 25 MG tablet Take 1 tablet (25 mg total) by mouth 3 (three) times daily. 90 tablet 11  . insulin aspart (NOVOLOG) 100 UNIT/ML injection Inject 3-4 Units into the skin 3 (three) times daily before meals. Per sliding scale    . insulin glargine (LANTUS) 100 UNIT/ML injection Inject 100 Units into the skin as directed. Sliding scale.    . isosorbide mononitrate (IMDUR) 60 MG 24 hr tablet Take 1.5 tablets (90 mg total) by mouth daily. 45 tablet 11  . lisinopril (PRINIVIL,ZESTRIL) 20 MG tablet Take 2 tablets (40 mg total) by mouth daily. 60 tablet 11  . potassium chloride (K-DUR) 10 MEQ tablet Take 2 tablets (20 mEq total) by mouth daily. 60 tablet 11  . spironolactone (ALDACTONE) 25 MG tablet Take 1 tablet (25 mg total) by mouth daily. 30 tablet 11  . warfarin (COUMADIN) 5 MG tablet Take 1 tablet (5 mg total) by mouth daily. Or as directed by [facility] (Patient taking differently: Take 5-7.5 mg by mouth See admin instructions. Take 7.5 mg by mouth on Friday and take 5 mg by mouth on all other days of the week.) 30 tablet 1  . esomeprazole (NEXIUM) 40 MG capsule Take 40 mg by mouth daily as needed (for acid reflux).     . nitroGLYCERIN (NITROSTAT) 0.4 MG SL tablet Place 1 tablet (0.4 mg total) under the tongue every 5 (five) minutes as needed for chest pain. 25 tablet 3  . ondansetron (ZOFRAN) 4 MG tablet Take 4 mg by mouth every 8 (eight) hours as needed for nausea or vomiting.       Objective: BP (!) 145/103   Pulse 84   Temp 98.8 F (37.1 C) (Oral)   Resp 19   Wt 201 lb 11.2 oz (91.5 kg) Comment: normal weight 185lbs per pt  SpO2 100%   BMI 29.79 kg/m  Exam: General: Middle-aged gentleman lying in bed. Eyes: PERRL, conjunctivae within normal limits ENTM: Poor  dentition, moist mucous membranes Neck:No lymphadenopathy  Cardiovascular: RRR, no murmurs noted, radial  pulses present Respiratory: CTAB, no WOB, on room air  Gastrointestinal:  Bowel sounds present, no tenderness to palpation MSK: Able to move all extremities, 5 out of 5 strength Derm: No rashes or wounds, 2+ pitting edema to knee, no upper thigh edema noted. Neuro: Alert and oriented 3, normal sensation and strength  Psych: Psych:  Cognition and judgment appear intact.   Labs and Imaging: CBC BMET   Recent Labs Lab 08/20/16 1120  WBC 7.2  HGB 10.5*  HCT 32.3*  PLT 276    Recent Labs Lab 08/20/16 1120  NA 137  K 4.0  CL 103  CO2 28  BUN 21*  CREATININE 1.25*  GLUCOSE 240*  CALCIUM 8.7*     .Dg Chest 2 View  Result Date: 08/20/2016 CLINICAL DATA:  Bile leg swelling for 1-2 weeks. Shortness of breath EXAM: CHEST  2 VIEW COMPARISON:  07/01/2012 FINDINGS: Left AICD remains in place, unchanged. Cardiomegaly. No edema. No confluent opacities or effusions. No acute bony abnormality. IMPRESSION: Cardiomegaly.  No active disease. Electronically Signed   By: Rolm Baptise M.D.   On: 08/20/2016 12:10    Shernita Rabinovich Cletis Media, MD 08/20/2016, 3:25 PM PGY-2, [place] Intern pager: [phone], text pages welcome

## 2016-08-20 NOTE — ED Triage Notes (Signed)
Pt is here for BLE swelling and increasing sob for about 10 days.  Pt is on lasix and has been taking this as prescribed.  No CP with this.

## 2016-08-20 NOTE — ED Provider Notes (Signed)
[place] DEPT Provider Note   CSN: [id number] Arrival date & time: 08/20/16  1109     History   Chief Complaint Chief Complaint  Patient presents with  . Congestive Heart Failure    HPI [name] is a 50 y.o. male who presents with SOB and leg swelling. PMH significant for ischemic cardiomyopathy with EF 25-30% per Echo in Nov 2017, ICD placement, CAD, Crohn's disease, recurrent DVT on chronic anticoagulation (Warfarin - INR checked on 12/6 was 3). Cardiologist is Dr. Caryl Comes. Over the past week and a half he has noted increased bilateral leg swelling and worsening dyspnea on exertion. He has 4-5 pillow orthopnea at baseline. Denies SOB at rest but has noticed he is "getting winded" and fatigued quicker than normal. He normally takes Lasix 68m BID and has been taking extra for the past couple days which has improved his dyspnea but notes his leg swelling is getting worse. Normal weight for him is 185. Weight is noted to be 201 today. Denies change in diet. Denies fever, chills, chest pain, wheezing, cough, abdominal pain, N/V.  HPI  Past Medical History:  Diagnosis Date  . Angina   . ASCVD (arteriosclerotic cardiovascular disease)    , Anterior infarction 2005, LAD diagonal bifurcation intervention 03/2004  . Automatic implantable cardiac defibrillator -[employer]. Jude's       . Benign neoplasm of colon   . Chronic systolic heart failure ([facility]   . Coronary artery disease     Widely patent previously placed stents in the left anterior   . Crohn's disease ([facility]   . 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 Gastroparesis   . GERD (gastroesophageal reflux disease)   . High cholesterol   . Hyperlipidemia   . Hypersomnolent    Previous diagnosis of narcolepsy  . Hypertension, essential   . Ischemic cardiomyopathy    Ejection fraction 15-20% catheterization 2010  . Type II or unspecified type diabetes mellitus without mention of complication, not stated as  uncontrolled   . Unspecified gastritis and gastroduodenitis without mention of hemorrhage     Patient Active Problem List   Diagnosis Date Noted  . Encounter for therapeutic drug monitoring 08/03/2016  . Chest pain 04/26/2012  . SVT (supraventricular tachycardia) ([facility] 04/26/2012  . Gastroparesis due to DM ([facility] 11/18/2011  . Nausea and vomiting in adult 11/17/2011  . Chronic systolic heart failure ([facility] 11/17/2011  . Coronary artery disease   . Ischemic cardiomyopathy   . Hypertension, essential   . Automatic implantable cardioverter-defibrillator in situ   . Deep venous thrombosis ([facility]   . Hypersomnolent   . POLYP, COLON 09/25/2008  . DM 09/25/2008  . HYPERLIPIDEMIA 09/25/2008  . GASTRITIS 09/25/2008  . DIVERTICULOSIS, COLON 09/25/2008  . RECTAL MASS 09/25/2008    Past Surgical History:  Procedure Laterality Date  . APPENDECTOMY    . CARDIAC DEFIBRILLATOR PLACEMENT  2010   St. Jude ICD  . COLECTOMY     "for Crohn's"  . COLON SURGERY    . FETAL SURGERY FOR CONGENITAL HERNIA    . INGUINAL HERNIA REPAIR         Home Medications    Prior to Admission medications   Medication Sig Start Date End Date Taking? Authorizing Provider  aspirin 81 MG tablet Take 81 mg by mouth daily.    Historical Provider, MD  atorvastatin (LIPITOR) 80 MG tablet Take 1 tablet (80 mg total) by mouth 3 (three) times a week. 07/13/16  Jerline Pain, MD  carvedilol (COREG) 25 MG tablet Take 1 tablet (25 mg total) by mouth 2 (two) times daily with a meal. 07/13/16   Jerline Pain, MD  digoxin (LANOXIN) 0.125 MG tablet Take 1 tablet (0.125 mg total) by mouth daily. 07/13/16   Jerline Pain, MD  esomeprazole (NEXIUM) 40 MG capsule Take 40 mg by mouth daily before breakfast.    Historical Provider, MD  furosemide (LASIX) 80 MG tablet Take 1 tablet (80 mg total) by mouth 2 (two) times daily. 07/13/16   Jerline Pain, MD  hydrALAZINE (APRESOLINE) 25 MG tablet Take 1 tablet (25 mg total) by mouth 3  (three) times daily. 07/13/16   Jerline Pain, MD  insulin aspart (NOVOLOG) 100 UNIT/ML injection Inject 10 Units into the skin 3 (three) times daily before meals. Take 10-25 units into the skin 3 times daily.    Historical Provider, MD  insulin glargine (LANTUS) 100 UNIT/ML injection Inject 100 Units into the skin as directed. Sliding scale.    Historical Provider, MD  isosorbide mononitrate (IMDUR) 60 MG 24 hr tablet Take 1.5 tablets (90 mg total) by mouth daily. 07/13/16   Jerline Pain, MD  lisinopril (PRINIVIL,ZESTRIL) 20 MG tablet Take 2 tablets (40 mg total) by mouth daily. 07/13/16   Jerline Pain, MD  magnesium oxide (MAG-OX) 400 MG tablet Take 400 mg by mouth daily.    Historical Provider, MD  nitroGLYCERIN (NITROSTAT) 0.4 MG SL tablet Place 1 tablet (0.4 mg total) under the tongue every 5 (five) minutes as needed for chest pain. 07/13/16   Jerline Pain, MD  ondansetron (ZOFRAN) 4 MG tablet Take 4 mg by mouth as needed for nausea.    Historical Provider, MD  potassium chloride (K-DUR) 10 MEQ tablet Take 2 tablets (20 mEq total) by mouth daily. 07/13/16   Jerline Pain, MD  spironolactone (ALDACTONE) 25 MG tablet Take 1 tablet (25 mg total) by mouth daily. 07/13/16   Jerline Pain, MD  warfarin (COUMADIN) 5 MG tablet Take 1 tablet (5 mg total) by mouth daily. Or as directed by [facility] 08/03/16   Jerline Pain, MD    Family History Family History  Problem Relation Age of Onset  . Heart attack Father   . Stroke Paternal Grandfather     Social History Social History  Substance Use Topics  . Smoking status: Never Smoker  . Smokeless tobacco: Never Used  . Alcohol use No     Allergies   Metformin and related   Review of Systems Review of Systems  Constitutional: Negative for chills and fever.  Respiratory: Positive for shortness of breath. Negative for cough, chest tightness and wheezing.   Cardiovascular: Positive for leg swelling. Negative for chest pain and  palpitations.  Gastrointestinal: Negative for abdominal pain, nausea and vomiting.  All other systems reviewed and are negative.    Physical Exam Updated Vital Signs BP 146/98 (BP Location: Left Arm)   Pulse 86   Temp 98.8 F (37.1 C) (Oral)   Resp 16   Wt 91.5 kg Comment: normal weight 185lbs per pt  SpO2 100%   BMI 29.79 kg/m   Physical Exam  Constitutional: He is oriented to person, place, and time. He appears well-developed and well-nourished. No distress.  HENT:  Head: Normocephalic and atraumatic.  Eyes: Conjunctivae are normal. Pupils are equal, round, and reactive to light. Right eye exhibits no discharge. Left eye exhibits no discharge. No scleral icterus.  Neck: Normal range of motion.  Cardiovascular: Normal rate and regular rhythm.  Exam reveals no gallop and no friction rub.   No murmur heard. Pulmonary/Chest: Effort normal. No respiratory distress. He has no wheezes. He has rales. He exhibits no tenderness.  Bibasilar crackles  Abdominal: He exhibits no distension.  Musculoskeletal:  2+ pitting edema from feet to knee bilaterally  Neurological: He is alert and oriented to person, place, and time.  Skin: Skin is warm and dry.  Psychiatric: He has a normal mood and affect. His behavior is normal.  Nursing note and vitals reviewed.    ED Treatments / Results  Labs (all labs ordered are listed, but only abnormal results are displayed) Labs Reviewed  BASIC METABOLIC PANEL - Abnormal; Notable for the following:       Result Value   Glucose, Bld 240 (*)    BUN 21 (*)    Creatinine, Ser 1.25 (*)    Calcium 8.7 (*)    All other components within normal limits  CBC - Abnormal; Notable for the following:    RBC 3.95 (*)    Hemoglobin 10.5 (*)    HCT 32.3 (*)    All other components within normal limits  DIFFERENTIAL  BRAIN NATRIURETIC PEPTIDE  I-STAT TROPOININ, ED    EKG  EKG Interpretation None       Radiology Dg Chest 2 View  Result Date:  08/20/2016 CLINICAL DATA:  Bile leg swelling for 1-2 weeks. Shortness of breath EXAM: CHEST  2 VIEW COMPARISON:  07/01/2012 FINDINGS: Left AICD remains in place, unchanged. Cardiomegaly. No edema. No confluent opacities or effusions. No acute bony abnormality. IMPRESSION: Cardiomegaly.  No active disease. Electronically Signed   By: Rolm Baptise M.D.   On: 08/20/2016 12:10    Procedures Procedures (including critical care time)  Medications Ordered in ED Medications  atorvastatin (LIPITOR) tablet 80 mg (80 mg Oral Given 08/20/16 1702)  carvedilol (COREG) tablet 25 mg (25 mg Oral Given 08/21/16 0634)  digoxin (LANOXIN) tablet 0.125 mg (0.125 mg Oral Given 08/21/16 1013)  hydrALAZINE (APRESOLINE) tablet 25 mg (25 mg Oral Given 08/21/16 1017)  isosorbide mononitrate (IMDUR) 24 hr tablet 90 mg (90 mg Oral Given 08/21/16 1013)  spironolactone (ALDACTONE) tablet 25 mg (25 mg Oral Given 08/21/16 1013)  aspirin chewable tablet 81 mg (81 mg Oral Given 08/21/16 1013)  pantoprazole (PROTONIX) EC tablet 40 mg (40 mg Oral Given 08/21/16 1013)  insulin aspart (novoLOG) injection 0-15 Units (5 Units Subcutaneous Given 08/21/16 1100)  sodium chloride flush (NS) 0.9 % injection 3 mL (3 mLs Intravenous Given 08/21/16 1014)  Warfarin - Pharmacist Dosing Inpatient ( Does not apply Duplicate 75/17/00 1749)  insulin glargine (LANTUS) injection 5 Units (5 Units Subcutaneous Given 08/21/16 1013)  furosemide (LASIX) injection 80 mg (not administered)  warfarin (COUMADIN) tablet 5 mg (not administered)  furosemide (LASIX) injection 80 mg (80 mg Intravenous Given 08/20/16 1512)  furosemide (LASIX) injection 80 mg (80 mg Intravenous Given 08/20/16 1822)  warfarin (COUMADIN) tablet 5 mg (5 mg Oral Given 08/20/16 1822)     Initial Impression / Assessment and Plan / ED Course  I have reviewed the triage vital signs and the nursing notes.  Pertinent labs & imaging results that were available during my care of the  patient were reviewed by me and considered in my medical decision making (see chart for details).  Clinical Course    51 year old male presents with CHF exacerbation. Patient is afebrile, not tachycardic  or tachypneic, and not hypoxic. He is hypertensive. CBC remarkable for anemia which is slightly lower than baseline. BMP remarkable for glucose of 240 and SCr of 1.25 which is around baseline. Troponin is 0.05. BNP is 1536.9 with no previous value to compare. CXR does not show overt edema although crackles are heard on exam and patient does have significant lower leg swelling. Since he has failed treatment as outpatient by increasing his dose, will admit for diuresis. Spoke with Family med team who will admit. Appreciate assistance.   Final Clinical Impressions(s) / ED Diagnoses   Final diagnoses:  Acute on chronic systolic congestive heart failure [facility])    New Prescriptions New Prescriptions   No medications on file     Recardo Evangelist, PA-C 08/21/16 Short, MD 08/21/16 [phone]

## 2016-08-20 NOTE — ED Notes (Signed)
No active shortness of breath at this time.  Pt has had some intermittent sob with this increasing bilateral lower extremity edema

## 2016-08-20 NOTE — Progress Notes (Signed)
Patient BP 95/59. HR 72.  MD notified, Mikail.  Verbal order to hold all night medications (Imdur and Hydralazine). Will continue to monitor. A.Oney Tatlock, RN

## 2016-08-20 NOTE — Progress Notes (Addendum)
ANTICOAGULATION CONSULT NOTE - Initial Consult  Pharmacy Consult for Warfarin  Indication: History of DVT  Allergies  Allergen Reactions  . Metformin And Related Diarrhea   Patient Measurements: Height: 5' 8"  (172.7 cm) Weight: 193 lb 9.6 oz (87.8 kg) (scale A) IBW/kg (Calculated) : 68.4  Vital Signs: Temp: 98 F (36.7 C) (12/16 1609) Temp Source: Oral (12/16 1609) BP: 151/96 (12/16 1609) Pulse Rate: 89 (12/16 1609)  Labs:  Recent Labs  08/20/16 1120  HGB 10.5*  HCT 32.3*  PLT 276  CREATININE 1.25*    Estimated Creatinine Clearance: 75.4 mL/min (by C-G formula based on SCr of 1.25 mg/dL (H)).   Medical History: Past Medical History:  Diagnosis Date  . Angina   . ASCVD (arteriosclerotic cardiovascular disease)    , Anterior infarction 2005, LAD diagonal bifurcation intervention 03/2004  . Automatic implantable cardiac defibrillator -St. Jude's       . Benign neoplasm of colon   . Chronic systolic heart failure ([place])   . Coronary artery disease     Widely patent previously placed stents in the left anterior   . Crohn's disease ([place])   . 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 Gastroparesis   . GERD (gastroesophageal reflux disease)   . High cholesterol   . Hyperlipidemia   . Hypersomnolent    Previous diagnosis of narcolepsy  . Hypertension, essential   . Ischemic cardiomyopathy    Ejection fraction 15-20% catheterization 2010  . Type II or unspecified type diabetes mellitus without mention of complication, not stated as uncontrolled   . Unspecified gastritis and gastroduodenitis without mention of hemorrhage      Assessment: Warfarin PTA for history of DVT, here for bilateral LE swelling and shortness of breath, Hgb 10.5, mild bump in Scr. No INR drawn yet.   Warfarin home regimen: 7.5 mg on Friday, 5 mg all other days  Goal of Therapy:  INR 2-3 Monitor platelets by anticoagulation protocol: Yes   Plan:  -STAT INR to assess  warfarin dosing needs  Narda Bonds 08/20/2016,4:34 PM    Addendum -INR 1.9 -warfarin 5 mg po x1 -daily INR   Harvel Quale 08/20/2016 5:28 PM

## 2016-08-20 NOTE — Progress Notes (Signed)
CRITICAL VALUE ALERT  Critical value received:  Troponin 0.05  Date of notification:  08/20/2016  Time of notification:  1886  Critical value read back:Yes.    Nurse who received alert:  Shellee Milo, RN  MD notified (1st page):  McDiamird, MD  Time of first page:  [phone]  Responding MD:  Mcdiarmird  Time MD responded:  [phone]

## 2016-08-20 NOTE — ED Notes (Signed)
Rn flushed IV and infiltrated

## 2016-08-21 DIAGNOSIS — I249 Acute ischemic heart disease, unspecified: Secondary | ICD-10-CM

## 2016-08-21 DIAGNOSIS — R7989 Other specified abnormal findings of blood chemistry: Secondary | ICD-10-CM

## 2016-08-21 DIAGNOSIS — E119 Type 2 diabetes mellitus without complications: Secondary | ICD-10-CM

## 2016-08-21 DIAGNOSIS — Z794 Long term (current) use of insulin: Secondary | ICD-10-CM

## 2016-08-21 LAB — CBC
HCT: 31 % — ABNORMAL LOW (ref 39.0–52.0)
Hemoglobin: 10.1 g/dL — ABNORMAL LOW (ref 13.0–17.0)
MCH: 26.6 pg (ref 26.0–34.0)
MCHC: 32.6 g/dL (ref 30.0–36.0)
MCV: 81.6 fL (ref 78.0–100.0)
PLATELETS: 270 10*3/uL (ref 150–400)
RBC: 3.8 MIL/uL — AB (ref 4.22–5.81)
RDW: 14 % (ref 11.5–15.5)
WBC: 6 10*3/uL (ref 4.0–10.5)

## 2016-08-21 LAB — BASIC METABOLIC PANEL
Anion gap: 10 (ref 5–15)
BUN: 22 mg/dL — ABNORMAL HIGH (ref 6–20)
CALCIUM: 8.4 mg/dL — AB (ref 8.9–10.3)
CO2: 28 mmol/L (ref 22–32)
CREATININE: 1.33 mg/dL — AB (ref 0.61–1.24)
Chloride: 100 mmol/L — ABNORMAL LOW (ref 101–111)
GFR calc non Af Amer: >60 mL/min (ref >60)
Glucose, Bld: 267 mg/dL — ABNORMAL HIGH (ref 65–99)
Potassium: 3.5 mmol/L (ref 3.5–5.1)
SODIUM: 138 mmol/L (ref 135–145)

## 2016-08-21 LAB — GLUCOSE, CAPILLARY
GLUCOSE-CAPILLARY: 254 mg/dL — AB (ref 65–99)
GLUCOSE-CAPILLARY: 201 mg/dL — AB (ref 65–99)
GLUCOSE-CAPILLARY: 143 mg/dL — AB (ref 65–99)
Glucose-Capillary: 126 mg/dL — ABNORMAL HIGH (ref 65–99)

## 2016-08-21 LAB — HEMOGLOBIN A1C
Hgb A1c MFr Bld: 13.8 % — ABNORMAL HIGH (ref 4.8–5.6)
Mean Plasma Glucose: 349 mg/dL

## 2016-08-21 LAB — PROTIME-INR
INR: 2.22
PROTHROMBIN TIME: 24.9 s — AB (ref 11.4–15.2)

## 2016-08-21 LAB — TROPONIN I: Troponin I: 0.05 ng/mL (ref <0.03)

## 2016-08-21 MED ORDER — INSULIN GLARGINE 100 UNIT/ML ~~LOC~~ SOLN
10.0000 [IU] | Freq: Every day | SUBCUTANEOUS | Status: DC
  Administered 2016-08-22: 10 [IU] via SUBCUTANEOUS
  Filled 2016-08-21: qty 0.1

## 2016-08-21 MED ORDER — FUROSEMIDE 10 MG/ML IJ SOLN
80.0000 mg | Freq: Two times a day (BID) | INTRAMUSCULAR | Status: DC
  Administered 2016-08-21: 80 mg via INTRAVENOUS
  Filled 2016-08-21 (×2): qty 8

## 2016-08-21 MED ORDER — INSULIN GLARGINE 100 UNIT/ML ~~LOC~~ SOLN
5.0000 [IU] | Freq: Every day | SUBCUTANEOUS | Status: DC
  Administered 2016-08-21: 5 [IU] via SUBCUTANEOUS
  Filled 2016-08-21: qty 0.05

## 2016-08-21 MED ORDER — WARFARIN SODIUM 5 MG PO TABS
5.0000 mg | ORAL_TABLET | Freq: Once | ORAL | Status: AC
  Administered 2016-08-21: 5 mg via ORAL
  Filled 2016-08-21: qty 1

## 2016-08-21 NOTE — Plan of Care (Signed)
Problem: Pain Managment: Goal: General experience of comfort will improve Outcome: Progressing Denies pain  Problem: Skin Integrity: Goal: Risk for impaired skin integrity will decrease Outcome: Progressing No skin issues noted  Problem: Tissue Perfusion: Goal: Risk factors for ineffective tissue perfusion will decrease Outcome: Progressing TED Hos on, no s/s of dvt noted  Problem: Activity: Goal: Risk for activity intolerance will decrease Outcome: Progressing OOB to BR without any distress  Problem: Bowel/Gastric: Goal: Will not experience complications related to bowel motility Outcome: Progressing Denies gastric and bowel issues

## 2016-08-21 NOTE — Progress Notes (Signed)
Family Medicine Teaching Service Daily Progress Note Intern Pager: [phone]  Patient name: [name] Medical record number: [id number] Date of birth: Jan 04, 1966 Age: 50 y.o. Gender: male  Primary Care Provider: No PCP Per Patient Consultants: None Code Status: Full  Assessment and Plan: 50 y.o. male presenting with dyspnea . PMH is significant for HLD, gasteroparesis, T2DM, DVT (x4),  CAD, ICD, crohns, HTN   HFrEF Exacerbation: BNP 1,536.9. Last echo on 08/04/16 with an ejection fraction of 30%, severe hypokinesis of inferior/inferoseptal/posterior walls, apical akinesis. ICD placed 2011.  Wells criteria are for pulmonary embolism 1.5 as patient has had a history of 4 DVTs, otherwise no signs or symptoms of PE therefore low probability.  - Strict I&Os   - Daily weights: 201pounds>>190pounds. Dry weight 185pounds.  - TED hose placed  - Lasix 82m IV x2 on 12/16. Will continue IV lasix 852mtoday for two doses and then consider transition to oral diuretic 12/18 - Continue home Imdur, Coreg, Spironolactone, Digoxin   T2DM:  - A1C  - AC QHS  - Moderate SSI for now  - Initiate Lantus 5units today   Suspected CKD: Creatinine 1.25>>1.33. Baseline unknown.  - Holding home Lisinopril  - Avoid nephrotoxic agents  - Monitor on BMP. Hopeful as fluid status improves kidney function may also improve.  Anemia: Hemoglobin 10.5>>10.1. - Monitor on CBC  FEN/GI: Diet Heart health Prophylaxis: Warfarin   Disposition: Home  Subjective:  Reports he is feeling much better today. Still notes mild shortness of breath with exertion, but improved. Edema much improved. Reports he has not been having as much urine output with his home lantus over the last several weeks--has had increased urine output on IV lasix during hospitalization.  Objective: Temp:  [98 F (36.7 C)-98.8 F (37.1 C)] 98.1 F (36.7 C) (12/17 0455) Pulse Rate:  [67-89] 70 (12/17 0455) Resp:  [16-22] 18 (12/17  0455) BP: (95-156)/(59-103) 123/77 (12/17 0455) SpO2:  [97 %-100 %] 98 % (12/17 0455) Weight:  [190 lb 3.2 oz (86.3 kg)-201 lb 11.2 oz (91.5 kg)] 190 lb 3.2 oz (86.3 kg) (12/17 0455) Physical Exam: General: 5132yoale resting comfortably in bed Cardiovascular: S1 and S2 noted, regular rate and rhythm, no murmur Respiratory: Faint crackles over bilateral bases, no increased work of breathing Abdomen: Soft and nondistended, nontender Extremities: TED hose in place, trace edema  Laboratory:  Recent Labs Lab 08/20/16 1120 08/21/16 0424  WBC 7.2 6.0  HGB 10.5* 10.1*  HCT 32.3* 31.0*  PLT 276 270    Recent Labs Lab 08/20/16 1120 08/21/16 0424  NA 137 138  K 4.0 3.5  CL 103 100*  CO2 28 28  BUN 21* 22*  CREATININE 1.25* 1.33*  CALCIUM 8.7* 8.4*  GLUCOSE 240* 267*  - BNP 1536.9  Imaging/Diagnostic Tests: Dg Chest 2 View  Result Date: 08/20/2016 CLINICAL DATA:  Bile leg swelling for 1-2 weeks. Shortness of breath EXAM: CHEST  2 VIEW COMPARISON:  07/01/2012 FINDINGS: Left AICD remains in place, unchanged. Cardiomegaly. No edema. No confluent opacities or effusions. No acute bony abnormality. IMPRESSION: Cardiomegaly.  No active disease. Electronically Signed   By: KeRolm Baptise.D.   On: 08/20/2016 12:10   RaLorna FewDO 08/21/2016, 8:32 AM PGY-3, [place] pager: [phone] pages welcome

## 2016-08-21 NOTE — Progress Notes (Signed)
[name] for Warfarin  Indication: History of DVT  Allergies  Allergen Reactions  . Metformin And Related Diarrhea   Patient Measurements: Height: 5' 8"  (172.7 cm) Weight: 190 lb 3.2 oz (86.3 kg) IBW/kg (Calculated) : 68.4  Vital Signs: Temp: 98.5 F (36.9 C) (12/17 1213) Temp Source: Oral (12/17 1213) BP: 105/61 (12/17 1213) Pulse Rate: 70 (12/17 1213)  Labs:  Recent Labs  08/20/16 1120 08/20/16 1637 08/20/16 2212 08/21/16 0424  HGB 10.5*  --   --  10.1*  HCT 32.3*  --   --  31.0*  PLT 276  --   --  270  LABPROT  --  22.2*  --  24.9*  INR  --  1.92  --  2.22  CREATININE 1.25*  --   --  1.33*  TROPONINI  --  0.05* 0.04* 0.05*    Estimated Creatinine Clearance: 70.3 mL/min (by C-G formula based on SCr of 1.33 mg/dL (H)).   Medical History: Past Medical History:  Diagnosis Date  . Angina   . ASCVD (arteriosclerotic cardiovascular disease)    , Anterior infarction 2005, LAD diagonal bifurcation intervention 03/2004  . Automatic implantable cardiac defibrillator -St. Jude's       . Benign neoplasm of colon   . Chronic systolic heart failure ([place])   . Coronary artery disease     Widely patent previously placed stents in the left anterior   . Crohn's disease ([place])   . 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 Gastroparesis   . GERD (gastroesophageal reflux disease)   . High cholesterol   . Hyperlipidemia   . Hypersomnolent    Previous diagnosis of narcolepsy  . Hypertension, essential   . Ischemic cardiomyopathy    Ejection fraction 15-20% catheterization 2010  . Type II or unspecified type diabetes mellitus without mention of complication, not stated as uncontrolled   . Unspecified gastritis and gastroduodenitis without mention of hemorrhage      Assessment: Warfarin PTA for history of DVT, here for bilateral LE swelling and shortness of breath  INR at goal 2.22 CBC stable, no bleeding issues  noted.  Warfarin home regimen: 7.5 mg on Friday, 5 mg all other days  Goal of Therapy:  INR 2-3 Monitor platelets by anticoagulation protocol: Yes   Plan:  Warfarin 52m tonight INR in am  - change to MWF if stable  FErin HearingPharmD., BCPS Clinical Pharmacist Pager [phone]/17/2017 1:00 PM

## 2016-08-22 DIAGNOSIS — R7989 Other specified abnormal findings of blood chemistry: Secondary | ICD-10-CM

## 2016-08-22 DIAGNOSIS — E119 Type 2 diabetes mellitus without complications: Secondary | ICD-10-CM

## 2016-08-22 DIAGNOSIS — Z794 Long term (current) use of insulin: Secondary | ICD-10-CM

## 2016-08-22 LAB — GLUCOSE, CAPILLARY
GLUCOSE-CAPILLARY: 202 mg/dL — AB (ref 65–99)
GLUCOSE-CAPILLARY: 112 mg/dL — AB (ref 65–99)
Glucose-Capillary: 320 mg/dL — ABNORMAL HIGH (ref 65–99)

## 2016-08-22 LAB — BASIC METABOLIC PANEL
Anion gap: 7 (ref 5–15)
BUN: 24 mg/dL — AB (ref 6–20)
CO2: 26 mmol/L (ref 22–32)
CREATININE: 1.34 mg/dL — AB (ref 0.61–1.24)
Calcium: 8 mg/dL — ABNORMAL LOW (ref 8.9–10.3)
Chloride: 105 mmol/L (ref 101–111)
GFR calc Af Amer: >60 mL/min (ref >60)
GFR, EST NON AFRICAN AMERICAN: 60 mL/min — AB (ref >60)
GLUCOSE: 258 mg/dL — AB (ref 65–99)
Potassium: 3.9 mmol/L (ref 3.5–5.1)
SODIUM: 138 mmol/L (ref 135–145)

## 2016-08-22 LAB — CBC
HEMATOCRIT: 28.8 % — AB (ref 39.0–52.0)
HEMOGLOBIN: 9.2 g/dL — AB (ref 13.0–17.0)
MCH: 26.4 pg (ref 26.0–34.0)
MCHC: 31.9 g/dL (ref 30.0–36.0)
MCV: 82.5 fL (ref 78.0–100.0)
Platelets: 236 10*3/uL (ref 150–400)
RBC: 3.49 MIL/uL — AB (ref 4.22–5.81)
RDW: 14.3 % (ref 11.5–15.5)
WBC: 6.8 10*3/uL (ref 4.0–10.5)

## 2016-08-22 LAB — PROTIME-INR
INR: 1.99
PROTHROMBIN TIME: 22.9 s — AB (ref 11.4–15.2)

## 2016-08-22 MED ORDER — FUROSEMIDE 80 MG PO TABS
80.0000 mg | ORAL_TABLET | Freq: Two times a day (BID) | ORAL | Status: DC
  Administered 2016-08-22 (×2): 80 mg via ORAL
  Filled 2016-08-22 (×2): qty 1

## 2016-08-22 MED ORDER — WARFARIN SODIUM 5 MG PO TABS
5.0000 mg | ORAL_TABLET | Freq: Once | ORAL | Status: AC
  Administered 2016-08-22: 5 mg via ORAL
  Filled 2016-08-22: qty 1

## 2016-08-22 MED ORDER — INSULIN GLARGINE 100 UNIT/ML ~~LOC~~ SOLN: 15.0000 [IU] | Freq: Every morning | SUBCUTANEOUS | 11 refills | Status: DC

## 2016-08-22 NOTE — Progress Notes (Signed)
[facility] for Warfarin  Indication: History of DVT  Allergies  Allergen Reactions  . Metformin And Related Diarrhea   Patient Measurements: Height: 5' 8"  (172.7 cm) Weight: 189 lb 4.8 oz (85.9 kg) IBW/kg (Calculated) : 68.4  Vital Signs: Temp: 98.6 F (37 C) (12/18 0438) Temp Source: Oral (12/18 0438) BP: 118/64 (12/18 0438) Pulse Rate: 71 (12/18 0438)  Labs:  Recent Labs  08/20/16 1120 08/20/16 1637 08/20/16 2212 08/21/16 0424 08/22/16 0429  HGB 10.5*  --   --  10.1*  --   HCT 32.3*  --   --  31.0*  --   PLT 276  --   --  270  --   LABPROT  --  22.2*  --  24.9* 22.9*  INR  --  1.92  --  2.22 1.99  CREATININE 1.25*  --   --  1.33*  --   TROPONINI  --  0.05* 0.04* 0.05*  --     Estimated Creatinine Clearance: 70.1 mL/min (by C-G formula based on SCr of 1.33 mg/dL (H)).   Medical History: Past Medical History:  Diagnosis Date  . Angina   . ASCVD (arteriosclerotic cardiovascular disease)    , Anterior infarction 2005, LAD diagonal bifurcation intervention 03/2004  . Automatic implantable cardiac defibrillator -St. Jude's       . Benign neoplasm of colon   . Chronic systolic heart failure ([place])   . Coronary artery disease     Widely patent previously placed stents in the left anterior   . Crohn's disease ([place])   . 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 Gastroparesis   . GERD (gastroesophageal reflux disease)   . High cholesterol   . Hyperlipidemia   . Hypersomnolent    Previous diagnosis of narcolepsy  . Hypertension, essential   . Ischemic cardiomyopathy    Ejection fraction 15-20% catheterization 2010  . Type II or unspecified type diabetes mellitus without mention of complication, not stated as uncontrolled   . Unspecified gastritis and gastroduodenitis without mention of hemorrhage      Assessment: Warfarin PTA for history of 4 DVTs, here for HF exacerbation significant for bilateral LE  edema and dyspnea.   INR down today; slightly below goal at 1.99. H/H stable-low, pltc wnl.   Warfarin home regimen: 7.5 mg on Friday, 5 mg all other days  Goal of Therapy:  INR 2-3 Monitor platelets by anticoagulation protocol: Yes   Plan:  Warfarin 65m x1 tonight INR in am  - change to MWF if stable  JDierdre Harness BS, PharmD Clinical Pharmacy Resident [phone](Pager) 08/22/2016 8:39 AM

## 2016-08-22 NOTE — Progress Notes (Signed)
Transitions of Care Pharmacy Note  Plan:  Re-educated patient on warfarin and insulin. Talked to patient about signs and symptoms of bleeding. Re-educated patient on administering insulin and signs of low blood sugar.  Discussed insurance issues with patient and delivered Lantus samples. Recommended Reli-ON glucose meter and strips from [employer] due to affordability.   --------------------------------------------- Paco E Mccardle is an 50 y.o. male who presents with a chief complaint of shortness of breath from a heart failure exacerbation.  In anticipation of discharge, pharmacy has reviewed this patient's prior to admission medication history, as well as current inpatient medications listed per the [facility].  Current medication indications, dosing, frequency, and notable side effects reviewed with family. family verbalized understanding of current inpatient medication regimen and is aware that the After Visit Summary when presented, will represent the most accurate medication list at discharge.   Willett E Cedillo expressed concerns regarding being able to afford his insulin. Spoke with the family medicine resident and lantus samples were provided to the patient. He will follow-up at the clinic within the month.   Assessment: Understanding of regimen: good Understanding of indications: good Potential of compliance: good Barriers to Obtaining Medications: Yes- Insurance does not cover medications.   Patient instructed to contact inpatient pharmacy team with further questions or concerns if needed.    Time spent preparing for discharge counseling: 20 Time spent counseling patient: 10   Thank you for allowing pharmacy to be a part of this patient's care.  Ihor Austin, PharmD PGY1 Pharmacy Resident Pager: [phone]

## 2016-08-22 NOTE — Discharge Summary (Signed)
[facility] Discharge Summary  Patient name: [name] Medical record number: [id number] Date of birth: [birth year] Age: 50 y.o. Gender: male Date of Admission: 08/20/2016  Date of Discharge: 08/22/2016 Admitting Physician: Blane Ohara McDiarmid, MD  Primary Care Provider: No PCP Per Patient Consultants: None  Indication for Hospitalization:  Heart failure  Discharge Diagnoses/Problem List:  Active Problems:   CHF exacerbation (HCC)   ACS (acute coronary syndrome) ([place])   Type 2 diabetes mellitus without complication, with long-term current use of insulin (HCC)   Elevated serum creatinine  Disposition: Discharge home  Discharge Condition: Stable, improved  Discharge Exam:  General: 50yo male resting comfortably in bed Cardiovascular: S1 and S2 noted, regular rate and rhythm, no murmur Respiratory: NWOB, CTABL, no wheezing or crackles Abdomen: Soft and nondistended, nontender Extremities: TED hose in place, trace edema  Brief Hospital Course:  Patient was admitted with BNP greater than 1500 and his weight was increased to 201 pounds with a dry weight of 185 pounds.  His last echo was 08/04/2016 with an ejection fraction of 30%, severe hypokinesis and inferior/inferior septal/posterior walls, apical akinesis. 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    He was diuresed with 80 mg IV Lasix twice a day and at the time of discharge his dry weight was 189 pounds.  He was discharged with the recommendation to continue 80 mg by mouth Lasix twice a day until he follows up with his cardiologist.  No other changes were made for his heart failure regimen during this admission. Additionally he has known type 2 diabetes and his most recent A1c was 13.8 noted during his admission. He was initially started on 5 units of  Lantus and this increased to 10 units Lantus and required 20 units of NovoLog over the past 24 hours. CBGs were largely in the low to mid 200s during admission.  Was seen by Pharmacist prior to DC and was educated on medication.  Discharged with lantus samples and instructions to use 15U lantus in AM and CBG checks x2 daily with plan to follow up at [facility] next week on 09/02/16 at 9:30AM.   Issues for Follow Up:  1. Heart failure exacerbation:  Diuresed with 53m IV lasix and DC with instructions to take lasix 848mPO BID and to follow up with Cardiologist. 2. Diabetes: A1C 13.8.  Had reportedly not been taking insulin since 2013.  Discharge patient with samples of Lantus instructions to take 15 units daily and to follow-up in clinic and to check blood sugars 2 times daily and to bring these readings in with him.  3. Possible CKD: Creatinine 1.25>>1.33>1.34  Baseline unknown. Holding lisinopril, would be appropriate to recheck BMP.    Significant Procedures: None  Significant Labs and Imaging:   Recent Labs Lab 08/20/16 1120 08/21/16 0424 08/22/16 1000  WBC 7.2 6.0 6.8  HGB 10.5* 10.1* 9.2*  HCT 32.3* 31.0* 28.8*  PLT 276 270 236    Recent Labs Lab 08/20/16 1120 08/21/16 0424 08/22/16 1000  NA 137 138 138  K 4.0 3.5 3.9  CL 103 100* 105  CO2 28 28 26   GLUCOSE 240* 267* 258*  BUN 21* 22* 24*  CREATININE 1.25* 1.33* 1.34*  CALCIUM 8.7* 8.4* 8.0*    Results/Tests Pending at Time of Discharge: None   Samples of Lantus  were given to the patient, quantity 2, Lot Number [id number]  Discharge Medications:  Allergies as of 08/22/2016      Reactions   Metformin And Related Diarrhea      Medication List    STOP taking these medications   insulin aspart 100 UNIT/ML injection Commonly known as:  novoLOG   lisinopril 20 MG tablet Commonly known as:  PRINIVIL,ZESTRIL     TAKE these medications   aspirin 81 MG tablet Take 81 mg by mouth daily.   atorvastatin 80 MG  tablet Commonly known as:  LIPITOR Take 1 tablet (80 mg total) by mouth 3 (three) times a week. What changed:  when to take this   carvedilol 25 MG tablet Commonly known as:  COREG Take 1 tablet (25 mg total) by mouth 2 (two) times daily with a meal.   digoxin 0.125 MG tablet Commonly known as:  LANOXIN Take 1 tablet (0.125 mg total) by mouth daily.   esomeprazole 40 MG capsule Commonly known as:  NEXIUM Take 40 mg by mouth daily as needed (for acid reflux).   furosemide 80 MG tablet Commonly known as:  LASIX Take 1 tablet (80 mg total) by mouth 2 (two) times daily. What changed:  how much to take   hydrALAZINE 25 MG tablet Commonly known as:  APRESOLINE Take 1 tablet (25 mg total) by mouth 3 (three) times daily.   insulin glargine 100 UNIT/ML injection Commonly known as:  LANTUS Inject 0.15 mLs (15 Units total) into the skin every morning. Sliding scale. What changed:  how much to take  when to take this   isosorbide mononitrate 60 MG 24 hr tablet Commonly known as:  IMDUR Take 1.5 tablets (90 mg total) by mouth daily.   nitroGLYCERIN 0.4 MG SL tablet Commonly known as:  NITROSTAT Place 1 tablet (0.4 mg total) under the tongue every 5 (five) minutes as needed for chest pain.   ondansetron 4 MG tablet Commonly known as:  ZOFRAN Take 4 mg by mouth every 8 (eight) hours as needed for nausea or vomiting.   potassium chloride 10 MEQ tablet Commonly known as:  K-DUR Take 2 tablets (20 mEq total) by mouth daily.   spironolactone 25 MG tablet Commonly known as:  ALDACTONE Take 1 tablet (25 mg total) by mouth daily.   warfarin 5 MG tablet Commonly known as:  COUMADIN Take 1 tablet (5 mg total) by mouth daily. Or as directed by Coumadin Clinic What changed:  how much to take  when to take this  additional instructions       Discharge Instructions: Please refer to Patient Instructions section of EMR for full details.  Patient was counseled important signs  and symptoms that should prompt return to medical care, changes in medications, dietary instructions, activity restrictions, and follow up appointments.   Follow-Up Appointments: Follow-up Information    Darci Needle, MD Follow up on 09/02/2016.   Specialty:  Family Medicine Why:  @ 9:30AM   Contact information: [place] [address] [phone]           Eloise Levels, MD 08/22/2016, 5:00 PM PGY-1, [employer]

## 2016-08-22 NOTE — Progress Notes (Signed)
Pt continued to void in the bathroom and refusing to use his urinal, advised earlier during the night to use urinal for accurate output measurement.

## 2016-08-22 NOTE — Progress Notes (Signed)
Family Medicine Teaching Service Daily Progress Note Intern Pager: [phone]  Patient name: [name] Medical record number: [id number] Date of birth: February 16, 1966 Age: 50 y.o. Gender: male  Primary Care Provider: No PCP Per Patient Consultants: None Code Status: Full  Assessment and Plan: 50 y.o. male presenting with dyspnea . PMH is significant for HLD, gasteroparesis, T2DM, DVT (x4),  CAD, ICD, crohns, HTN   HFrEF Exacerbation: BNP 1,536.9. Last echo on 08/04/16 with an ejection fraction of 30%, severe hypokinesis of inferior/inferoseptal/posterior walls, apical akinesis. ICD placed 2011.  Wells criteria are for pulmonary embolism 1.5 as patient has had a history of 4 DVTs, otherwise no signs or symptoms of PE therefore low probability.  Feels great this AM. No crackles on exam and barely trace pitting edema to the ankles.  - Strict I&Os   - Daily weights: 201pounds>>189pounds. Dry weight 185pounds.  - TED hose placed  - transition to PO lasix 68m BID - Continue home Imdur, Coreg, Spironolactone, Digoxin   T2DM:  22U novolog past 24hrs with 5U lantus started yesterday. CBG this AM 202.  A1C 13.8.   - A1C- 13.8 - AC QHS  - Moderate SSI for now  - increase lantus to 10U  Suspected CKD: Creatinine 1.25>>1.33>1.34  Baseline unknown.  - Holding home Lisinopril  - Avoid nephrotoxic agents  - Monitor on BMP. Hopeful as fluid status improves kidney function may also improve.  Anemia: Hemoglobin 10.5>>10.1>9.2 - Monitor on CBC  FEN/GI: Diet Heart health Prophylaxis: Warfarin   Disposition: Home  Subjective:  Reports he is feeling much better today.  Denies CP, SOB, NVD or palpitations.   Objective: Temp:  [98.5 F (36.9 C)-98.7 F (37.1 C)] 98.6 F (37 C) (12/18 0438) Pulse Rate:  [70-76] 71 (12/18 0438) Resp:  [18-20] 18 (12/18 0438) BP: (105-121)/(61-74) 118/64 (12/18 0438) SpO2:  [96 %-99 %] 96 % (12/18 0438) Weight:  [189 lb 4.8 oz (85.9 kg)] 189 lb 4.8 oz  (85.9 kg) (12/18 0438) Physical Exam: General: 556y[place] resting comfortably in bed Cardiovascular: S1 and S2 noted, regular rate and rhythm, no murmur Respiratory: NWOB, CTABL, no wheezing or crackles Abdomen: Soft and nondistended, nontender Extremities: TED hose in place, trace edema  Laboratory:  Recent Labs Lab 08/20/16 1120 08/21/16 0424  WBC 7.2 6.0  HGB 10.5* 10.1*  HCT 32.3* 31.0*  PLT 276 270    Recent Labs Lab 08/20/16 1120 08/21/16 0424  NA 137 138  K 4.0 3.5  CL 103 100*  CO2 28 28  BUN 21* 22*  CREATININE 1.25* 1.33*  CALCIUM 8.7* 8.4*  GLUCOSE 240* 267*  - BNP 1536.9  Imaging/Diagnostic Tests: Dg Chest 2 View  Result Date: 08/20/2016 CLINICAL DATA:  Bile leg swelling for 1-2 weeks. Shortness of breath EXAM: CHEST  2 VIEW COMPARISON:  07/01/2012 FINDINGS: Left AICD remains in place, unchanged. Cardiomegaly. No edema. No confluent opacities or effusions. No acute bony abnormality. IMPRESSION: Cardiomegaly.  No active disease. Electronically Signed   By: KRolm BaptiseM.D.   On: 08/20/2016 12:10   DEloise Levels MD 08/22/2016, 8:02 AM PGY-1, [facility] pager: [phone] text pages welcome

## 2016-08-22 NOTE — Progress Notes (Signed)
Inpatient Diabetes Program Recommendations  AACE/ADA: New Consensus Statement on Inpatient Glycemic Control (2015)  Target Ranges:  Prepandial:   less than 140 mg/dL      Peak postprandial:   less than 180 mg/dL (1-2 hours)      Critically ill patients:  140 - 180 mg/dL   Results for DOAK, MAH (MRN [id number]) as of 08/22/2016 07:29  Ref. Range 08/21/2016 06:38 08/21/2016 11:16 08/21/2016 16:33 08/21/2016 21:25 08/22/2016 06:01  Glucose-Capillary Latest Ref Range: 65 - 99 mg/dL 254 (H) 201 (H) 126 (H) 143 (H) 202 (H)   Results for COBEY, RAINERI (MRN [id number]) as of 08/22/2016 07:29  Ref. Range 08/20/2016 16:37  Hemoglobin A1C Latest Ref Range: 4.8 - 5.6 % 13.8 (H)    Admit with: Dyspnea  History: DM2  Home DM Meds: Lantus 100 units daily       Novolog 3-4 units TID per SSI (has Not taken any insulin in over a month per MD notes)  Current Insulin Orders: Lantus 10 units daily      Novolog Moderate Correction Scale/ SSI (0-15 units) TID AC       -Note Lantus increased to 10 units daily today.  -Current A1c shows extremely poor control at home.  Note that patient admits to not taking insulin for over a month per MD notes.  -Spoke with pt today about his home insulin regimen and why he has not taken insulin in the last month.  Pt seemed mildly irritated that I came to ask him questions.  Clarified that he takes Lantus 100 units daily + Novolog SSI TID at home.  Told me that the doctor already told him that his A1c was 13%.  Also told me that his daughter is currently setting him up an appointment with Dr. Renato Shin with [facility] for after d/c.  Patient stated he did not have any further questions for me.     MD- Please consider increasing Lantus to 17 units daily (0.2 units/kg dsogin based on weight of 86 kg)  Please also consider starting Novolog Meal Coverage: Novolog 6 units TID with meals (hold if pt eats <50% of meal)      --Will  follow patient during hospitalization--  Wyn Quaker RN, MSN, CDE Diabetes Coordinator Inpatient Glycemic Control Team Team Pager: [phone] (8a-5p)

## 2016-08-22 NOTE — Progress Notes (Signed)
Patient given discharge instructions and all questions answered.  Pt. Discharged via wheelchair with all belongings.

## 2016-08-22 NOTE — Progress Notes (Signed)
PT Cancellation Note  Patient Details Name: [name] MRN: [id number] DOB: 16-Apr-1966   Cancelled Treatment:    Reason Eval/Treat Not Completed: PT screened, no needs identified, will sign off. Pt reports he is ambulating without difficulty and has no needs.   Shary Decamp [place] 08/22/2016, 12:06 PM [employer] PT [phone]

## 2016-08-22 NOTE — Discharge Instructions (Signed)

## 2016-08-30 ENCOUNTER — Other Ambulatory Visit: Payer: Self-pay | Admitting: *Deleted

## 2016-08-30 ENCOUNTER — Encounter: Payer: Self-pay | Admitting: *Deleted

## 2016-08-30 DIAGNOSIS — I255 Ischemic cardiomyopathy: Secondary | ICD-10-CM

## 2016-08-30 DIAGNOSIS — Z01812 Encounter for preprocedural laboratory examination: Secondary | ICD-10-CM

## 2016-09-02 ENCOUNTER — Encounter: Payer: Self-pay | Admitting: Internal Medicine

## 2016-09-02 ENCOUNTER — Ambulatory Visit (INDEPENDENT_AMBULATORY_CARE_PROVIDER_SITE_OTHER): Payer: BLUE CROSS/BLUE SHIELD | Admitting: Internal Medicine

## 2016-09-02 VITALS — BP 130/90 | HR 90 | Temp 98.3°F | Ht 69.0 in | Wt 187.4 lb

## 2016-09-02 DIAGNOSIS — E118 Type 2 diabetes mellitus with unspecified complications: Secondary | ICD-10-CM | POA: Diagnosis not present

## 2016-09-02 DIAGNOSIS — E119 Type 2 diabetes mellitus without complications: Secondary | ICD-10-CM

## 2016-09-02 DIAGNOSIS — I5022 Chronic systolic (congestive) heart failure: Secondary | ICD-10-CM

## 2016-09-02 DIAGNOSIS — I82499 Acute embolism and thrombosis of other specified deep vein of unspecified lower extremity: Secondary | ICD-10-CM

## 2016-09-02 DIAGNOSIS — Z5181 Encounter for therapeutic drug level monitoring: Secondary | ICD-10-CM

## 2016-09-02 DIAGNOSIS — Z7901 Long term (current) use of anticoagulants: Secondary | ICD-10-CM | POA: Diagnosis not present

## 2016-09-02 DIAGNOSIS — I509 Heart failure, unspecified: Secondary | ICD-10-CM | POA: Insufficient documentation

## 2016-09-02 DIAGNOSIS — Z794 Long term (current) use of insulin: Secondary | ICD-10-CM

## 2016-09-02 DIAGNOSIS — N179 Acute kidney failure, unspecified: Secondary | ICD-10-CM | POA: Diagnosis not present

## 2016-09-02 LAB — BASIC METABOLIC PANEL WITH GFR
BUN: 29 mg/dL — ABNORMAL HIGH (ref 7–25)
CHLORIDE: 106 mmol/L (ref 98–110)
CO2: 28 mmol/L (ref 20–31)
Calcium: 8.5 mg/dL — ABNORMAL LOW (ref 8.6–10.3)
Creat: 1.22 mg/dL (ref 0.70–1.33)
GFR, Est African American: 79 mL/min (ref >=60)
GFR, Est Non African American: 68 mL/min (ref >=60)
Glucose, Bld: 75 mg/dL (ref 65–99)
POTASSIUM: 4.3 mmol/L (ref 3.5–5.3)
SODIUM: 142 mmol/L (ref 135–146)

## 2016-09-02 LAB — POCT INR: INR: 1.6

## 2016-09-02 MED ORDER — INSULIN GLARGINE 100 UNIT/ML ~~LOC~~ SOLN: 30.0000 [IU] | Freq: Every morning | SUBCUTANEOUS | 11 refills | Status: DC

## 2016-09-02 NOTE — Assessment & Plan Note (Addendum)
-   No evidence of fluid overload on exam today - Will check BMP to see if AKI has resolved from hospitalization given increased lasix dosing - Encouraged patient to continue daily weights

## 2016-09-02 NOTE — Progress Notes (Signed)
[name] Progress Note  Subjective:  [name] is a 50 y.o. male who presents for hospital follow-up after recent CHF exacerbation. He reports feeling well.  CHF:  - Weight has not fluctuated more than about 1 lb a day. He reports weight at home as been 182-183 lbs. (Last dry weight recorded as 185.)  - Denies SOB, chest pain - Continues to wear TED hose all day - Occasionally has some swelling at his ankles - Is mindful of salt intake  T2DM: - Had difficulty in the past affording insulin, which led to his present poor control, but he has new insurance in January that should provide coverage. He reports he has enough samples until then. A1c 13.8 on 08/20/16.  - Has been taking 20 U lantus daily. Reports he had been taking up to 100 U lantus in the past.  - CBGs have been low to mid 200s; checking twice daily - Does report blurriness of his vision that he believes fluctuates with his sugars; has been ongoing for years - Has not seen an eye doctor recently  Hx DVT: - On coumadin and goes to a coumadin clinic but missed recent appointment for INR check - Normally takes 5 mg every day except Fridays, when he takes 7.5 mg - Missed 2 days of coumadin in the last week  Past Medical History:  Diagnosis Date  . Angina   . ASCVD (arteriosclerotic cardiovascular disease)    , Anterior infarction 2005, LAD diagonal bifurcation intervention 03/2004  . Automatic implantable cardiac defibrillator -[employer] Jude[employer]       . Benign neoplasm of colon   . Chronic systolic heart failure ([place])   . Coronary artery disease     Widely patent previously placed stents in the left anterior   . Crohn's disease ([place])   . 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 Gastroparesis   . GERD (gastroesophageal reflux disease)   . High cholesterol   . Hyperlipidemia   . Hypersomnolent    Previous diagnosis of narcolepsy  . Hypertension, essential   . Ischemic  cardiomyopathy    Ejection fraction 15-20% catheterization 2010  . Type II or unspecified type diabetes mellitus without mention of complication, not stated as uncontrolled   . Unspecified gastritis and gastroduodenitis without mention of hemorrhage     Social History   Social History  . Marital status: Married    Spouse name: N/A  . Number of children: N/A  . Years of education: N/A   Occupational History  . Not on file.   Social History Main Topics  . Smoking status: Never Smoker  . Smokeless tobacco: Never Used  . Alcohol use No  . Drug use: No  . Sexual activity: No   Other Topics Concern  . Not on file   Social History Narrative  . No narrative on file    Allergies  Allergen Reactions  . Metformin And Related Diarrhea    Objective: Blood pressure 130/90, pulse 90, temperature 98.3 F (36.8 C), temperature source Oral, height 5' 9"  (1.753 m), weight 187 lb 6.4 oz (85 kg), SpO2 99 %. Body mass index is 27.67 kg/m. Constitutional: Overweight male, in NAD Cardiovascular: RRR, S1, S2, no m/r/g.  Pulmonary/Chest: Effort normal and breath sounds normal. No respiratory distress.  Musculoskeletal: No LE edema.  Neurological: AOx3, no focal deficits. Psychiatric: Normal mood and affect.  Vitals reviewed  Assessment/Plan: CHF (congestive heart failure) (HCC) - No evidence of  fluid overload on exam today - Will check BMP to see if AKI has resolved from hospitalization given increased lasix dosing - Encouraged patient to continue daily weights  Type 2 diabetes mellitus without complication, with long-term current use of insulin (HCC) - Recommended increasing lantus to 30 U daily from 20 U - Asked patient to check CBGs 3-4 times a day until follow-up to get better sense of control to adjust medications - Provided list of eye doctors and encouraged patient to have exam as soon as possible  Deep venous thrombosis - INR 1.6 today. Advised patient to take 10 mg  coumadin today, then to resume his normal schedule of 5 mg daily except 7.5 mg on Fridays - Patient to follow-up with coumadin clinic  Follow-up in about 1 month to review blood pressure control and weights.  Future Appointments Date Time Provider [place]  09/07/2016 3:30 PM [facility]   [name], MD [place], PGY-2

## 2016-09-02 NOTE — Assessment & Plan Note (Signed)
-   Recommended increasing lantus to 30 U daily from 20 U - Asked patient to check CBGs 3-4 times a day until follow-up to get better sense of control to adjust medications - Provided list of eye doctors and encouraged patient to have exam as soon as possible

## 2016-09-02 NOTE — Patient Instructions (Signed)
[name],  I am glad you are doing so well!  Please increase lantus to 30 units daily. Please keep a log of blood sugars 3-4 times a day to know how best to change your medications. Please make an appointment in about 1 month to continue to adjust your medications.  Best, Dr. Ola Spurr

## 2016-09-02 NOTE — Assessment & Plan Note (Signed)
-   INR 1.6 today. Advised patient to take 10 mg coumadin today, then to resume his normal schedule of 5 mg daily except 7.5 mg on Fridays - Patient to follow-up with coumadin clinic

## 2016-09-07 ENCOUNTER — Other Ambulatory Visit: Payer: BLUE CROSS/BLUE SHIELD | Admitting: *Deleted

## 2016-09-07 DIAGNOSIS — Z01812 Encounter for preprocedural laboratory examination: Secondary | ICD-10-CM

## 2016-09-07 DIAGNOSIS — I255 Ischemic cardiomyopathy: Secondary | ICD-10-CM

## 2016-09-08 LAB — CBC WITH DIFFERENTIAL/PLATELET
BASOS ABS: 0 10*3/uL (ref 0.0–0.2)
Basos: 0 %
EOS (ABSOLUTE): 0.2 10*3/uL (ref 0.0–0.4)
Eos: 2 %
Hematocrit: 37.5 % (ref 37.5–51.0)
Hemoglobin: 11.7 g/dL — ABNORMAL LOW (ref 13.0–17.7)
IMMATURE GRANS (ABS): 0 10*3/uL (ref 0.0–0.1)
Immature Granulocytes: 0 %
Lymphocytes Absolute: 1.3 10*3/uL (ref 0.7–3.1)
Lymphs: 17 %
MCH: 25.3 pg — AB (ref 26.6–33.0)
MCHC: 31.2 g/dL — AB (ref 31.5–35.7)
MCV: 81 fL (ref 79–97)
Monocytes Absolute: 1 10*3/uL — ABNORMAL HIGH (ref 0.1–0.9)
Monocytes: 13 %
NEUTROS ABS: 5 10*3/uL (ref 1.4–7.0)
Neutrophils: 68 %
PLATELETS: 279 10*3/uL (ref 150–379)
RBC: 4.63 x10E6/uL (ref 4.14–5.80)
RDW: 14.9 % (ref 12.3–15.4)
WBC: 7.5 10*3/uL (ref 3.4–10.8)

## 2016-09-08 LAB — BASIC METABOLIC PANEL
BUN/Creatinine Ratio: 23 — ABNORMAL HIGH (ref 9–20)
BUN: 34 mg/dL — AB (ref 6–24)
CHLORIDE: 98 mmol/L (ref 96–106)
CO2: 20 mmol/L (ref 18–29)
Calcium: 8.2 mg/dL — ABNORMAL LOW (ref 8.7–10.2)
Creatinine, Ser: 1.47 mg/dL — ABNORMAL HIGH (ref 0.76–1.27)
GFR calc Af Amer: 63 mL/min/{1.73_m2} (ref >59)
GFR calc non Af Amer: 54 mL/min/{1.73_m2} — ABNORMAL LOW (ref >59)
GLUCOSE: 205 mg/dL — AB (ref 65–99)
POTASSIUM: 4.1 mmol/L (ref 3.5–5.2)
Sodium: 140 mmol/L (ref 134–144)

## 2016-09-08 LAB — DIGOXIN LEVEL: Digoxin, Serum: <0.4 ng/mL — ABNORMAL LOW (ref 0.5–0.9)

## 2016-09-08 LAB — PROTIME-INR
INR: 2 — ABNORMAL HIGH (ref 0.8–1.2)
PROTHROMBIN TIME: 20.7 s — AB (ref 9.1–12.0)

## 2016-09-09 ENCOUNTER — Other Ambulatory Visit: Payer: Self-pay | Admitting: Internal Medicine

## 2016-09-09 DIAGNOSIS — Z95810 Presence of automatic (implantable) cardiac defibrillator: Secondary | ICD-10-CM

## 2016-09-14 ENCOUNTER — Encounter (HOSPITAL_COMMUNITY): Payer: Self-pay | Admitting: Internal Medicine

## 2016-09-14 ENCOUNTER — Encounter (HOSPITAL_COMMUNITY)
Admission: RE | Disposition: A | Discharge status: Home / Self Care | Payer: Self-pay | Source: Ambulatory Visit | Attending: Internal Medicine

## 2016-09-14 ENCOUNTER — Ambulatory Visit (HOSPITAL_COMMUNITY)
Admission: RE | Admit: 2016-09-14 | Discharge: 2016-09-14 | Disposition: A | Discharge status: Home / Self Care | Payer: BLUE CROSS/BLUE SHIELD | Source: Ambulatory Visit | Attending: Internal Medicine | Admitting: Internal Medicine

## 2016-09-14 DIAGNOSIS — Z4502 Encounter for adjustment and management of automatic implantable cardiac defibrillator: Secondary | ICD-10-CM | POA: Diagnosis not present

## 2016-09-14 DIAGNOSIS — I5022 Chronic systolic (congestive) heart failure: Secondary | ICD-10-CM | POA: Diagnosis not present

## 2016-09-14 DIAGNOSIS — E1143 Type 2 diabetes mellitus with diabetic autonomic (poly)neuropathy: Secondary | ICD-10-CM | POA: Insufficient documentation

## 2016-09-14 DIAGNOSIS — K5090 Crohn's disease, unspecified, without complications: Secondary | ICD-10-CM | POA: Diagnosis not present

## 2016-09-14 DIAGNOSIS — K3184 Gastroparesis: Secondary | ICD-10-CM | POA: Diagnosis not present

## 2016-09-14 DIAGNOSIS — Z7982 Long term (current) use of aspirin: Secondary | ICD-10-CM | POA: Diagnosis not present

## 2016-09-14 DIAGNOSIS — E785 Hyperlipidemia, unspecified: Secondary | ICD-10-CM | POA: Insufficient documentation

## 2016-09-14 DIAGNOSIS — G47419 Narcolepsy without cataplexy: Secondary | ICD-10-CM | POA: Insufficient documentation

## 2016-09-14 DIAGNOSIS — Z86718 Personal history of other venous thrombosis and embolism: Secondary | ICD-10-CM | POA: Insufficient documentation

## 2016-09-14 DIAGNOSIS — Z7901 Long term (current) use of anticoagulants: Secondary | ICD-10-CM | POA: Insufficient documentation

## 2016-09-14 DIAGNOSIS — I272 Pulmonary hypertension, unspecified: Secondary | ICD-10-CM | POA: Diagnosis not present

## 2016-09-14 DIAGNOSIS — I255 Ischemic cardiomyopathy: Secondary | ICD-10-CM | POA: Insufficient documentation

## 2016-09-14 DIAGNOSIS — K219 Gastro-esophageal reflux disease without esophagitis: Secondary | ICD-10-CM | POA: Diagnosis not present

## 2016-09-14 DIAGNOSIS — E7800 Pure hypercholesterolemia, unspecified: Secondary | ICD-10-CM | POA: Insufficient documentation

## 2016-09-14 DIAGNOSIS — I252 Old myocardial infarction: Secondary | ICD-10-CM | POA: Diagnosis not present

## 2016-09-14 DIAGNOSIS — I2510 Atherosclerotic heart disease of native coronary artery without angina pectoris: Secondary | ICD-10-CM | POA: Diagnosis not present

## 2016-09-14 DIAGNOSIS — I110 Hypertensive heart disease with heart failure: Secondary | ICD-10-CM | POA: Diagnosis not present

## 2016-09-14 DIAGNOSIS — Z95810 Presence of automatic (implantable) cardiac defibrillator: Secondary | ICD-10-CM

## 2016-09-14 HISTORY — PX: EP IMPLANTABLE DEVICE: SHX172B

## 2016-09-14 LAB — PROTIME-INR
INR: 1.37
Prothrombin Time: 17 seconds — ABNORMAL HIGH (ref 11.4–15.2)

## 2016-09-14 LAB — GLUCOSE, CAPILLARY
GLUCOSE-CAPILLARY: 242 mg/dL — AB (ref 65–99)
Glucose-Capillary: 206 mg/dL — ABNORMAL HIGH (ref 65–99)

## 2016-09-14 LAB — SURGICAL PCR SCREEN
MRSA, PCR: NEGATIVE
Staphylococcus aureus: NEGATIVE

## 2016-09-14 SURGERY — ICD GENERATOR CHANGEOUT

## 2016-09-14 MED ORDER — LIDOCAINE HCL (PF) 1 % IJ SOLN
INTRAMUSCULAR | Status: AC
  Filled 2016-09-14: qty 60

## 2016-09-14 MED ORDER — CEFAZOLIN SODIUM-DEXTROSE 2-4 GM/100ML-% IV SOLN
INTRAVENOUS | Status: AC
  Filled 2016-09-14: qty 100

## 2016-09-14 MED ORDER — LIDOCAINE HCL (PF) 1 % IJ SOLN
INTRAMUSCULAR | Status: DC | PRN
  Administered 2016-09-14: 31 mL

## 2016-09-14 MED ORDER — SODIUM CHLORIDE 0.9 % IR SOLN
Status: AC
  Filled 2016-09-14: qty 2

## 2016-09-14 MED ORDER — CHLORHEXIDINE GLUCONATE 4 % EX LIQD: 60.0000 mL | Freq: Once | CUTANEOUS | Status: DC

## 2016-09-14 MED ORDER — SODIUM CHLORIDE 0.9 % IR SOLN
80.0000 mg | Status: AC
  Administered 2016-09-14: 80 mg
  Filled 2016-09-14: qty 2

## 2016-09-14 MED ORDER — SODIUM CHLORIDE 0.9 % IV SOLN: INTRAVENOUS | Status: DC

## 2016-09-14 MED ORDER — ACETAMINOPHEN 325 MG PO TABS: 325.0000 mg | ORAL_TABLET | ORAL | Status: DC | PRN

## 2016-09-14 MED ORDER — ONDANSETRON HCL 4 MG/2ML IJ SOLN: 4.0000 mg | Freq: Four times a day (QID) | INTRAMUSCULAR | Status: DC | PRN

## 2016-09-14 MED ORDER — MUPIROCIN 2 % EX OINT
TOPICAL_OINTMENT | CUTANEOUS | Status: AC
  Administered 2016-09-14: 1 via TOPICAL
  Filled 2016-09-14: qty 22

## 2016-09-14 MED ORDER — MIDAZOLAM HCL 5 MG/5ML IJ SOLN
INTRAMUSCULAR | Status: AC
  Filled 2016-09-14: qty 5

## 2016-09-14 MED ORDER — MUPIROCIN 2 % EX OINT
1.0000 "application " | TOPICAL_OINTMENT | Freq: Once | CUTANEOUS | Status: AC
  Administered 2016-09-14: 1 via TOPICAL
  Filled 2016-09-14: qty 22

## 2016-09-14 MED ORDER — CEFAZOLIN SODIUM-DEXTROSE 2-4 GM/100ML-% IV SOLN
2.0000 g | INTRAVENOUS | Status: AC
Start: 2016-09-14 — End: 2016-09-14
  Administered 2016-09-14: 2 g via INTRAVENOUS
  Filled 2016-09-14: qty 100

## 2016-09-14 MED ORDER — SODIUM CHLORIDE 0.9 % IV SOLN
INTRAVENOUS | Status: DC
  Administered 2016-09-14: 09:00:00 via INTRAVENOUS

## 2016-09-14 MED ORDER — FENTANYL CITRATE (PF) 100 MCG/2ML IJ SOLN
INTRAMUSCULAR | Status: AC
  Filled 2016-09-14: qty 2

## 2016-09-14 MED ORDER — MIDAZOLAM HCL 5 MG/5ML IJ SOLN
INTRAMUSCULAR | Status: DC | PRN
  Administered 2016-09-14: 3 mg via INTRAVENOUS

## 2016-09-14 MED ORDER — FENTANYL CITRATE (PF) 100 MCG/2ML IJ SOLN
INTRAMUSCULAR | Status: DC | PRN
  Administered 2016-09-14: 50 ug via INTRAVENOUS

## 2016-09-14 SURGICAL SUPPLY — 4 items
CABLE SURGICAL S-101-97-12 (CABLE) ×1 IMPLANT
ICD ELLIPSE DR CD2411-36C (ICD Generator) ×1 IMPLANT
PAD DEFIB LIFELINK (PAD) ×1 IMPLANT
TRAY PACEMAKER INSERTION (PACKS) ×1 IMPLANT

## 2016-09-14 NOTE — Discharge Instructions (Signed)
Pacemaker Battery Change, Care After Refer to this sheet in the next few weeks. These instructions provide you with information on caring for yourself after your procedure. Your health care provider may also give you more specific instructions. Your treatment has been planned according to current medical practices, but problems sometimes occur. Call your health care provider if you have any problems or questions after your procedure. WHAT TO EXPECT AFTER THE PROCEDURE After your procedure, it is typical to have the following sensations:  Soreness at the pacemaker site. HOME CARE INSTRUCTIONS   Keep the incision clean and dry.  Unless advised otherwise, you may shower beginning 48 hours after your procedure.  For the first week after the replacement, avoid stretching motions that pull at the incision site, and avoid heavy exercise with the arm that is on the same side as the incision.  Take medicines only as directed by your health care provider.  Keep all follow-up visits as directed by your health care provider. SEEK MEDICAL CARE IF:   You have pain at the incision site that is not relieved by over-the-counter or prescription medicine.  There is drainage or pus from the incision site.  There is swelling larger than a lime at the incision site.  You develop red streaking that extends above or below the incision site.  You feel brief, intermittent palpitations, light-headedness, or any symptoms that you feel might be related to your heart. SEEK IMMEDIATE MEDICAL CARE IF:   You experience chest pain that is different than the pain at the pacemaker site.  You experience shortness of breath.  You have palpitations or irregular heartbeat.  You have light-headedness that does not go away quickly.  You faint.  You have pain that gets worse and is not relieved by medicine. This information is not intended to replace advice given to you by your health care provider. Make sure you  discuss any questions you have with your health care provider. Document Released: 06/12/2013 Document Revised: 09/12/2014 Document Reviewed: 06/12/2013 Elsevier Interactive Patient Education  2017 [name].

## 2016-09-14 NOTE — H&P (Addendum)
ICD Criteria  Current LVEF:25%. Within 12 months prior to implant: Yes   Heart failure history: Yes, Class II  Cardiomyopathy history: Yes, Ischemic Cardiomyopathy - Prior MI.  Atrial Fibrillation/Atrial Flutter: No.  Ventricular tachycardia history: No.  Cardiac arrest history: No.  History of syndromes with risk of sudden death: No.  Previous ICD: Yes, Reason for ICD:  Primary prevention.  Current ICD indication: Primary  PPM indication: No.   Class I or II Bradycardia indication present: No  Beta Blocker therapy for 3 or more months: Yes, prescribed.   Ace Inhibitor/ARB therapy for 3 or more months: No, medical reason.       Patient Care Team: No Pcp Per Patient as PCP - General (General Practice)   HPI  [name] is a 51 y.o. male Admitted for an ICD generator replacement, initially implanted 2010 for primary prevention. This was done in the setting of prior MI, prior stenting and ischemic cardiomyopathy with an ejection fraction of 15-20% and patent stents .     DATE TEST    12/10    Cath   EF 15-20 % Patent stents LAD/D1,   11/17    echo   EF 30 % Mod severe Pulm HTN/ Mod RV dysfunction           He is on guideline directed medical therapy.  He has no complaints of chest pain shortness of breath lightheadedness or palpitations syncope or ICD discharge;   He is concerned about the timing of his return to work   Records and Results Reviewed As above    Past Medical History:  Diagnosis Date  . Angina   . ASCVD (arteriosclerotic cardiovascular disease)    , Anterior infarction 2005, LAD diagonal bifurcation intervention 03/2004  . Automatic implantable cardiac defibrillator -[employer]       . Benign neoplasm of colon   . Chronic systolic heart failure ([place])   . Coronary artery disease     Widely patent previously placed stents in the left anterior   . Crohn's disease ([place])   . Deep venous thrombosis (HCC)    Recurrent-on Coumadin    . Gastroparesis   . GERD (gastroesophageal reflux disease)   . High cholesterol   . Hyperlipidemia   . Hypersomnolent    Previous diagnosis of narcolepsy  . Hypertension, essential   . Ischemic cardiomyopathy    Ejection fraction 15-20% catheterization 2010  . Type II or unspecified type diabetes mellitus without mention of complication, not stated as uncontrolled   . Unspecified gastritis and gastroduodenitis without mention of hemorrhage     Past Surgical History:  Procedure Laterality Date  . APPENDECTOMY    . CARDIAC DEFIBRILLATOR PLACEMENT  2010   St. Jude ICD  . COLECTOMY     "for Crohn's"  . COLON SURGERY    . FETAL SURGERY FOR CONGENITAL HERNIA    . INGUINAL HERNIA REPAIR      Current Facility-Administered Medications  Medication Dose Route Frequency Provider Last Rate Last Dose  . 0.9 %  sodium chloride infusion   Intravenous Continuous Deboraha Sprang, MD      . ceFAZolin (ANCEF) IVPB 2g/100 mL premix  2 g Intravenous On Call Deboraha Sprang, MD      . chlorhexidine (HIBICLENS) 4 % liquid 4 application  60 mL Topical Once Deboraha Sprang, MD      . gentamicin (GARAMYCIN) 80 mg in sodium chloride irrigation 0.9 % 500 mL irrigation  80 mg  Irrigation On Call Deboraha Sprang, MD      . mupirocin ointment (BACTROBAN) 2 % 1 application  1 application Topical Once Deboraha Sprang, MD      . mupirocin ointment (BACTROBAN) 2 %             Allergies  Allergen Reactions  . Metformin And Related Diarrhea   No current facility-administered medications on file prior to encounter.    Current Outpatient Prescriptions on File Prior to Encounter  Medication Sig Dispense Refill  . aspirin 81 MG tablet Take 81 mg by mouth daily.    [name] atorvastatin (LIPITOR) 80 MG tablet Take 1 tablet (80 mg total) by mouth 3 (three) times a week. (Patient taking differently: Take 80 mg by mouth every evening. ) 30 tablet 11  . carvedilol (COREG) 25 MG tablet Take 1 tablet (25 mg total) by mouth 2 (two)  times daily with a meal. 60 tablet 11  . digoxin (LANOXIN) 0.125 MG tablet Take 1 tablet (0.125 mg total) by mouth daily. 30 tablet 11  . esomeprazole (NEXIUM) 40 MG capsule Take 40 mg by mouth daily as needed (for acid reflux).     . furosemide (LASIX) 80 MG tablet Take 1 tablet (80 mg total) by mouth 2 (two) times daily. (Patient taking differently: Take 80-160 mg by mouth 2 (two) times daily. ) 60 tablet 1  . hydrALAZINE (APRESOLINE) 25 MG tablet Take 1 tablet (25 mg total) by mouth 3 (three) times daily. 90 tablet 11  . isosorbide mononitrate (IMDUR) 60 MG 24 hr tablet Take 1.5 tablets (90 mg total) by mouth daily. 45 tablet 11  . nitroGLYCERIN (NITROSTAT) 0.4 MG SL tablet Place 1 tablet (0.4 mg total) under the tongue every 5 (five) minutes as needed for chest pain. 25 tablet 3  . ondansetron (ZOFRAN) 4 MG tablet Take 4 mg by mouth every 8 (eight) hours as needed for nausea or vomiting.     . potassium chloride (K-DUR) 10 MEQ tablet Take 2 tablets (20 mEq total) by mouth daily. 60 tablet 11  . spironolactone (ALDACTONE) 25 MG tablet Take 1 tablet (25 mg total) by mouth daily. 30 tablet 11  . warfarin (COUMADIN) 5 MG tablet Take 1 tablet (5 mg total) by mouth daily. Or as directed by Coumadin Clinic (Patient taking differently: Take 5-7.5 mg by mouth See admin instructions. Take 47ms daily on Saturday through Thursday and 7.522m on Friday) 30 tablet 1       Review of Systems negative except from HPI and PMH  Physical Exam BP (!) 149/100   Pulse 82   Temp 98.2 F (36.8 C) (Oral)   Resp 18   Ht 5' 8"  (1.727 m)   Wt 180 lb (81.6 kg)   SpO2 100%   BMI 27.37 kg/m  Well developed and well nourished in no acute distress HENT normal E scleral and icterus clear Neck Supple JVP flat; carotids brisk and full Clear to ausculation Device pocket well healed; without hematoma or erythema.  There is no tethering  Regular rate and rhythm, no murmurs gallops or rub Soft with active bowel  sounds No clubbing cyanosis  Edema Alert and oriented, grossly normal motor and sensory function Skin Warm and Dry  ECG *NSR  18/10/44  Assessment and  Plan  Hypertension  Ischemic cardiomyopathy  Without symptoms of ischemia  Congestive heart failure-chronic systolic  Euvolemic continue current meds  Implantable defibrillator - [id number]       Device  at [facility]  We have reviewed the benefits and risks of generator replacement.  These include but are not limited to lead fracture and infection.  The patient understands, agrees and is willing to proceed.

## 2016-09-16 ENCOUNTER — Other Ambulatory Visit: Payer: Self-pay | Admitting: Cardiology

## 2016-09-26 ENCOUNTER — Ambulatory Visit (INDEPENDENT_AMBULATORY_CARE_PROVIDER_SITE_OTHER): Payer: BLUE CROSS/BLUE SHIELD | Admitting: *Deleted

## 2016-09-26 DIAGNOSIS — Z95810 Presence of automatic (implantable) cardiac defibrillator: Secondary | ICD-10-CM | POA: Diagnosis not present

## 2016-09-26 LAB — CUP PACEART INCLINIC DEVICE CHECK
Brady Statistic RA Percent Paced: 0 %
HighPow Impedance: 36.3424
Implantable Lead Implant Date: 20100430
Implantable Lead Location: 753859
Implantable Lead Model: 7121
Implantable Pulse Generator Implant Date: 20180110
Lead Channel Impedance Value: 337.5 Ohm
Lead Channel Impedance Value: 350 Ohm
Lead Channel Pacing Threshold Amplitude: 0.75 V
Lead Channel Pacing Threshold Amplitude: 0.75 V
Lead Channel Pacing Threshold Amplitude: 0.75 V
Lead Channel Pacing Threshold Pulse Width: 0.5 ms
Lead Channel Pacing Threshold Pulse Width: 0.5 ms
Lead Channel Setting Pacing Amplitude: 2.5 V
Lead Channel Setting Pacing Pulse Width: 0.5 ms
MDC IDC LEAD IMPLANT DT: 20100430
MDC IDC LEAD LOCATION: 753860
MDC IDC MSMT LEADCHNL RA PACING THRESHOLD AMPLITUDE: 0.75 V
MDC IDC MSMT LEADCHNL RA PACING THRESHOLD PULSEWIDTH: 0.5 ms
MDC IDC MSMT LEADCHNL RA SENSING INTR AMPL: 1.8 mV
MDC IDC MSMT LEADCHNL RV PACING THRESHOLD PULSEWIDTH: 0.5 ms
MDC IDC MSMT LEADCHNL RV SENSING INTR AMPL: 12 mV
MDC IDC SESS DTM: 20180122163656
MDC IDC SET LEADCHNL RA PACING AMPLITUDE: 2 V
MDC IDC SET LEADCHNL RV SENSING SENSITIVITY: 0.5 mV
MDC IDC STAT BRADY RV PERCENT PACED: 0 %
Pulse Gen Serial Number: 7383809

## 2016-09-26 NOTE — Progress Notes (Signed)
Wound check appointment s/p generator replacement 09/14/16 by SK. Dermabond removed. Wound without redness or edema. Incision edges approximated, wound well healed. Normal device function. Thresholds, sensing, and impedances consistent with implant measurements. Histogram distribution appropriate for patient and level of activity. No mode switches or ventricular arrhythmias noted. No Corvue data at this time- referred to [facility] due to patient interest. Patient educated about wound care, shock plan and Merlin monitoring. ROV 12/27/16 with SK.

## 2016-10-10 ENCOUNTER — Ambulatory Visit (INDEPENDENT_AMBULATORY_CARE_PROVIDER_SITE_OTHER): Payer: BLUE CROSS/BLUE SHIELD

## 2016-10-10 ENCOUNTER — Telehealth: Payer: Self-pay | Admitting: Cardiology

## 2016-10-10 DIAGNOSIS — I5022 Chronic systolic (congestive) heart failure: Secondary | ICD-10-CM

## 2016-10-10 DIAGNOSIS — Z95810 Presence of automatic (implantable) cardiac defibrillator: Secondary | ICD-10-CM

## 2016-10-10 NOTE — Telephone Encounter (Signed)
I will forward message to Dr Marlou Porch to review ICM note and make further recommendations if needed.

## 2016-10-10 NOTE — Telephone Encounter (Signed)
Reviewed ICM transmission received and discussed with Theodosia Quay, RN triage.  See ICM note for recommendations given to patient.

## 2016-10-10 NOTE — Telephone Encounter (Signed)
Call to patient to ask remind him to send ICM remote transmission.  He stated he will send it within the next 30 minutes.

## 2016-10-10 NOTE — Telephone Encounter (Signed)
I spoke with the pt and he complains of SOB when walking any distance and also having trouble lying down to sleep at night.  This started last week.  The pt states he has not weighed over the past few days and he does have swelling but this is his normal amount. The pt is taking Furosemide 26m twice a day as prescribed.  The pt states he just had ICD upgrade and wanted to know if his device shows if he is retaining fluid. I reached out to LSharman Cheekand she will contact the pt to send a transmission.  She will follow-up with me if the pt is in need of an appointment.

## 2016-10-10 NOTE — Telephone Encounter (Signed)
Spoke with patient to assist in sending ICM remote transmission.  Patient will not be able to send remote transmission until after 2:00 this afternoon because he is not at home. He reported same symptoms as he reported to Theodosia Quay, RN triage at earlier phone conversation.  He has gained 6 pounds in last week.  Baseline weight is 185lbs and he weighed 191 lbs yesterday.  He confirmed he normally takes Furosemide 80 mg bid and takes extra when needed.  He took 160 mg this am.  Advised to send transmission when he gets home and he agreed to monthly ICM follow to continue to check fluid levels.   Advised would inform Theodosia Quay, RN triage that unable to provide remote transmission until later today.  Explained he would receive call back from either myself or Lauren today for any further recommendations.

## 2016-10-10 NOTE — Telephone Encounter (Signed)
Pt c/o Shortness Of Breath: STAT if SOB developed within the last 24 hours or pt is noticeably SOB on the phone  1. Are you currently SOB (can you hear that pt is SOB on the phone)? No, cannot hear it.  2. How long have you been experiencing SOB? 2-3 days  3. Are you SOB when sitting or when up moving around? Both, SOB when going short distances and patient states she has trouble sleeping.  4. Are you currently experiencing any other symptoms? Fatigue "more so than normal"

## 2016-10-10 NOTE — Progress Notes (Signed)
EPIC Encounter for ICM Monitoring  Patient Name: [name] is a 51 y.o. male Date: 10/10/2016 Primary Care Physican: No PCP Per Patient Primary Cardiologist: Marlou Porch Electrophysiologist: Caryl Comes Dry Weight: 191 lbs        1st ICM encounter.  Patient referred to [facility] by Debroah Loop, Device RN.  Patient called triage today and spoke with RN Theodosia Quay to report symptoms.   Heart Failure questions reviewed, pt weight gain of 6 pounds in last few days.  Baseline weight is 185 lbs. Also SOB when walking any distance and also having trouble lying down to sleep at night.   Thoracic impedance abnormal suggesting fluid accumulation from 10/08/2016 until trending back to toward baseline today.  Gen change by Dr Caryl Comes on 09/14/2016.  Recommendations:  Furosemide prescribed dosage is 80 - 160 mg bid but he normally takes 80 mg bid.  He took 160 mg this am.  Advised to take 160 mg tomorrow morning and 80 in the afternoon.  Advised to call on morning of 10/12/2016 if symptoms have not improved.   He verbalized understanding.   Follow-up plan: ICM clinic phone appointment on 10/13/2016.  Copy of ICM check sent to primary cardiologist and device physician.   3 month ICM trend: 10/10/2016   1 Year ICM trend:      Rosalene Billings, RN 10/10/2016 3:13 PM

## 2016-10-13 ENCOUNTER — Telehealth: Payer: Self-pay | Admitting: Cardiology

## 2016-10-13 ENCOUNTER — Other Ambulatory Visit: Payer: Self-pay | Admitting: Cardiology

## 2016-10-13 ENCOUNTER — Ambulatory Visit (INDEPENDENT_AMBULATORY_CARE_PROVIDER_SITE_OTHER): Payer: BLUE CROSS/BLUE SHIELD

## 2016-10-13 DIAGNOSIS — I5022 Chronic systolic (congestive) heart failure: Secondary | ICD-10-CM

## 2016-10-13 DIAGNOSIS — Z95810 Presence of automatic (implantable) cardiac defibrillator: Secondary | ICD-10-CM

## 2016-10-13 NOTE — Telephone Encounter (Signed)
LMOVM reminding pt to send remote transmission.   

## 2016-10-14 NOTE — Telephone Encounter (Signed)
Agree with prior recs. Continue with fluid restriction, lasix as indicated. Candee Furbish, MD

## 2016-10-18 NOTE — Progress Notes (Signed)
EPIC Encounter for ICM Monitoring  Patient Name: [name] is a 51 y.o. male Date: 10/18/2016 Primary Care Physican: No PCP Per Patient Primary Cardiologist: Marlou Porch Electrophysiologist: Caryl Comes Dry Weight:    185 lbs        Heart Failure questions reviewed, pt asymptomatic now.  Weight has returned to baseline   Thoracic impedance returned to normal after taking extra Furosemide as prescribed.   Recommendations: No changes. Reminded to limit dietary salt intake to 2000 mg/day and fluid intake to < 2 liters/day. Encouraged to call for fluid symptoms.  Follow-up plan: ICM clinic phone appointment on 11/18/2016.  Copy of ICM check sent to primary cardiologist and device physician.   3 month ICM trend: 10/16/2016   1 Year ICM trend:      Rosalene Billings, RN 10/18/2016 10:46 AM

## 2016-10-18 NOTE — Progress Notes (Addendum)
Shellia Cleverly, RN routed conversation to You 1 hour ago (9:27 AM)    Jerline Pain, MD  Shellia Cleverly, RN 4 days ago      Agree with prior recs. Continue with fluid restriction, lasix as indicated. Candee Furbish, MD

## 2016-11-03 ENCOUNTER — Other Ambulatory Visit: Payer: Self-pay | Admitting: Cardiology

## 2016-11-03 ENCOUNTER — Telehealth: Payer: Self-pay | Admitting: Cardiology

## 2016-11-03 NOTE — Telephone Encounter (Addendum)
New message        *STAT* If patient is at the pharmacy, call can be transferred to refill team.   1. Which medications need to be refilled? (please list name of each medication and dose if known) warfarin 2. Which pharmacy/location (including street and city if local pharmacy) is medication to be sent to? [facility] [address]. Do they need a 30 day or 90 day supply? 30 Pt is waiting

## 2016-11-03 NOTE — Telephone Encounter (Signed)
Follow up        *STAT* If patient is at the pharmacy, call can be transferred to refill team.   1. Which medications need to be refilled? (please list name of each medication and dose if known) furosemide 2. Which pharmacy/location (including street and city if local pharmacy) is medication to be sent to? [facility] [address]. Do they need a 30 day or 90 day supply? 30 Pt is there waiting

## 2016-11-03 NOTE — Telephone Encounter (Signed)
Furosemide has already been sent to patients pharmacy. A refill encounter has been sent to the coumadin clinic to address the warfarin.

## 2016-11-03 NOTE — Telephone Encounter (Signed)
Order Providers   Prescribing Provider Encounter Provider  Jerline Pain, MD Jerline Pain, MD  Medication Detail    Disp Refills Start End   furosemide (LASIX) 80 MG tablet 60 tablet 7 11/03/2016    Sig - Route: TAKE 1 TABLET (80 MG TOTAL) BY MOUTH 2 (TWO) TIMES DAILY. - Oral   E-Prescribing Status: Receipt confirmed by pharmacy (11/03/2016 4:44 PM EST)   Pharmacy   [id number] [place], [place]

## 2016-11-03 NOTE — Telephone Encounter (Signed)
Called pt regarding a Warfarin refill, explained to pt that he has not been seen in the Anticoagulation clinic since December 6th, 2017 & would need to schedule an appt to be seen. He stated that Family practice has been monitoring him, advised that if they have been checking him he needs to call them for the refill. He stated that he needed his refill but again reminded him that I cannot send it in but I could schedule an appt then after he is seen I could refill med, asked if he would like to schedule an appt & he hung up the phone. Pt needs an appt prior to refilling Warfarin.

## 2016-11-09 ENCOUNTER — Encounter: Payer: Self-pay | Admitting: Family Medicine

## 2016-11-09 ENCOUNTER — Ambulatory Visit
Admission: RE | Admit: 2016-11-09 | Discharge: 2016-11-09 | Disposition: A | Discharge status: Home / Self Care | Payer: BLUE CROSS/BLUE SHIELD | Source: Ambulatory Visit | Attending: Family Medicine | Admitting: Family Medicine

## 2016-11-09 ENCOUNTER — Ambulatory Visit (INDEPENDENT_AMBULATORY_CARE_PROVIDER_SITE_OTHER): Payer: BLUE CROSS/BLUE SHIELD | Admitting: Family Medicine

## 2016-11-09 VITALS — BP 130/88 | HR 94 | Temp 98.5°F | Wt 196.0 lb

## 2016-11-09 DIAGNOSIS — R112 Nausea with vomiting, unspecified: Secondary | ICD-10-CM | POA: Diagnosis not present

## 2016-11-09 DIAGNOSIS — Z794 Long term (current) use of insulin: Secondary | ICD-10-CM | POA: Diagnosis not present

## 2016-11-09 DIAGNOSIS — R0609 Other forms of dyspnea: Secondary | ICD-10-CM

## 2016-11-09 DIAGNOSIS — E119 Type 2 diabetes mellitus without complications: Secondary | ICD-10-CM | POA: Diagnosis not present

## 2016-11-09 DIAGNOSIS — R5383 Other fatigue: Secondary | ICD-10-CM

## 2016-11-09 LAB — TSH: TSH: 3.04 m[IU]/L (ref 0.40–4.50)

## 2016-11-09 LAB — BASIC METABOLIC PANEL WITH GFR
BUN: 37 mg/dL — ABNORMAL HIGH (ref 7–25)
CALCIUM: 8.7 mg/dL (ref 8.6–10.3)
CO2: 25 mmol/L (ref 20–31)
CREATININE: 1.63 mg/dL — AB (ref 0.70–1.33)
Chloride: 97 mmol/L — ABNORMAL LOW (ref 98–110)
GFR, EST AFRICAN AMERICAN: 55 mL/min — AB (ref >=60)
GFR, EST NON AFRICAN AMERICAN: 48 mL/min — AB (ref >=60)
Glucose, Bld: 324 mg/dL — ABNORMAL HIGH (ref 65–99)
Potassium: 4.7 mmol/L (ref 3.5–5.3)
SODIUM: 137 mmol/L (ref 135–146)

## 2016-11-09 LAB — GLUCOSE, POCT (MANUAL RESULT ENTRY): POC GLUCOSE: 384 mg/dL — AB (ref 70–99)

## 2016-11-09 MED ORDER — ONDANSETRON HCL 4 MG PO TABS: 4.0000 mg | ORAL_TABLET | Freq: Three times a day (TID) | ORAL | 0 refills | Status: DC | PRN

## 2016-11-09 NOTE — Progress Notes (Signed)
   Subjective: CC: vomiting. [name] is a 51 y.o. male presenting to clinic today for same day appointment. PCP: Darci Needle, MD Concerns today include:  1. Vomiting Patient reports that he has had several days of fatigue, nausea, vomiting and DOE.  He reports that he has not taken his BG since Sunday.  That was also the last time he took any of his medications because he was not confident that he could keep his medications down.  He denies LE edema, fevers, chills, diarrhea, hematochezia, melena, hematemesis, headache, dizziness, dysuria.  He reports decreased oral intake 2/2 nausea and vomiting, citing he has only had 4 ounces to drink today.  He denies sick contacts.  He reports decreased UOP.  Allergies  Allergen Reactions  . Metformin And Related Diarrhea   Social Hx reviewed: non smoker. MedHx, current medications and allergies reviewed.  Please see EMR. ROS: Per HPI  Objective: Office vital signs reviewed. BP 130/88   Pulse 94   Temp 98.5 F (36.9 C) (Oral)   Wt 196 lb (88.9 kg)   SpO2 98%   BMI 29.80 kg/m   Physical Examination:  General: Awake, alert, well nourished, nontoxic, No acute distress HEENT: MM slightly dry Neck: no JVD Cardio: regular rate and rhythm, S1S2 heard, no murmurs appreciated Pulm: clear to auscultation bilaterally, no wheezes, rhonchi or rales; normal work of breathing on room air GI: obese, soft, NT/ND, +BS, no masses Extremities: warm, well perfused, trace ankle edema, cyanosis or clubbing; +2 pulses bilaterally  Results for orders placed or performed in visit on 11/09/16 (from the past 24 hour(s))  POCT glucose (manual entry)     Status: Abnormal   Collection Time: 11/09/16  3:02 PM  Result Value Ref Range   POC Glucose 384 (A) 70 - 99 mg/dl    Assessment/ Plan: 51 y.o. male   1. Fatigue, unspecified type.  Likely secondary to acute viral illness but patient has significant comorbidities including uncontrolled  DM2 and h/o CHF.  BG 384, doubt HHS/ DKA at this time.  Though does warrant resuming insulin.  No evidence of fluid overload on exam but given DOE, imaging and BNP ordered. - POCT glucose (manual entry) - BASIC METABOLIC PANEL WITH GFR - Brain natriuretic peptide - TSH - DG Chest 2 View; Future  2. Dyspnea on exertion.  Pulse Ox WNL, VSS. ?CHF exacerbation vs dyspnea 2/2 pan vs viral URI - BASIC METABOLIC PANEL WITH GFR - Brain natriuretic peptide - TSH - DG Chest 2 View; Future  3. Nausea and vomiting, intractability of vomiting not specified, unspecified vomiting type - Zofran administered in office.  Patient tolerated this well and was able to drink fluids PO here when challenged. - ondansetron (ZOFRAN) 4 MG tablet; Take 1 tablet (4 mg total) by mouth every 8 (eight) hours as needed for nausea or vomiting.  Dispense: 20 tablet; Refill: 0  4. Type 2 diabetes mellitus without complication, with long-term current use of insulin (HCC) - HgB A1c - POCT glucose (manual entry) - BASIC METABOLIC PANEL WITH GFR  Patient to follow up tomorrow for reevaluation and review of labs/ imaging.  In the interim, to resume home medications and BG checks.  Janora Norlander, DO PGY-3, [facility]

## 2016-11-09 NOTE — Patient Instructions (Addendum)
I recommend that you follow up tomorrow for reevaluation.  I have sent a medication to take for your nausea and vomiting.  I want you to drink plenty of fluids.  I recommend that you start taking your medications as soon as you are able.  It is important to continue controlling your blood sugar and heart failure.  I have also ordered a chest xray.  If the results are concerning, you will be contacted.  If your symptoms worsen overnight, I want you to seek immediate medical attention in the emergency department.

## 2016-11-10 ENCOUNTER — Encounter: Payer: Self-pay | Admitting: Family Medicine

## 2016-11-10 ENCOUNTER — Ambulatory Visit (INDEPENDENT_AMBULATORY_CARE_PROVIDER_SITE_OTHER): Payer: BLUE CROSS/BLUE SHIELD | Admitting: Family Medicine

## 2016-11-10 DIAGNOSIS — E119 Type 2 diabetes mellitus without complications: Secondary | ICD-10-CM | POA: Diagnosis not present

## 2016-11-10 DIAGNOSIS — Z794 Long term (current) use of insulin: Secondary | ICD-10-CM | POA: Diagnosis not present

## 2016-11-10 DIAGNOSIS — E1143 Type 2 diabetes mellitus with diabetic autonomic (poly)neuropathy: Secondary | ICD-10-CM

## 2016-11-10 DIAGNOSIS — K3184 Gastroparesis: Secondary | ICD-10-CM

## 2016-11-10 DIAGNOSIS — I5023 Acute on chronic systolic (congestive) heart failure: Secondary | ICD-10-CM | POA: Diagnosis not present

## 2016-11-10 LAB — BRAIN NATRIURETIC PEPTIDE: BRAIN NATRIURETIC PEPTIDE: 383.5 pg/mL — AB (ref <100)

## 2016-11-10 NOTE — Assessment & Plan Note (Signed)
Nausea may be part of CHF.  More likely it is his gastroparesis flaring.  Does not change Rx with zofran and liquids.  Reserve reglan for only severe vomiting.

## 2016-11-10 NOTE — Assessment & Plan Note (Signed)
Given symptoms, CXR and 10 lb wt increase, clearly has CHF exac.  Increase lasix to 80 mg tid.  Daily wts.  I worry about both dietary and medication compliance.  When I discussed low sodium diet, he seemed unaware.

## 2016-11-10 NOTE — Patient Instructions (Signed)
I think your big problem is a worsening of the congestive heart failure.  I want you to take the furosemide/lasix three times per day.  Also weigh yourself every day.  Cut back to twice a day if your weight drops below 185. I am not sure if the nausea is part of the CHF or its a separate problem.  We will see.  OK to keep using the nausea medicine. See [place] next week.  I hope you are feeling much better by then.  The next thing will be to focus on getting your diabetes under better control.

## 2016-11-10 NOTE — Progress Notes (Signed)
   Subjective:    Patient ID: [name], male    DOB: [birth year], 51 y.o.   MRN: [id number]  HPI 24 FU for nausea and SOB.  51 yo male with a heavy burden of chronic medical disease that seem poorly controled mainly due to compliance issues.  Yesterday, seen in [facility] with nausea and SOB.  See Dr.  Lajuana Ripple note.  Given zofran, labs drawn and CXR obtained.  Returns today with nausea improved on zofran.  Still with SOB.  Reviewing Hx, SOB started first 7-10 days ago.  Known CHF.  Nausea followed ~4-5 days ago.  Has scale at home and knows that his dry weight is between 180-185, but he does not weigh himself every day.  Creat is OK.  BS is up - not surprisingly since his last A1C was 13.  CXR showed pleural effusion and pulm vascular congestion.    Review of Systems     Objective:   Physical Exam wt =195.  VS noted Lungs, bilateral basilar rales.   Cardiac RRR without m or g Abd benign. Ex 1-2+ bilateral edema        Assessment & Plan:

## 2016-11-10 NOTE — Assessment & Plan Note (Signed)
Terrible control.  Will need close FU.

## 2016-11-12 ENCOUNTER — Other Ambulatory Visit: Payer: Self-pay | Admitting: Cardiology

## 2016-11-14 ENCOUNTER — Ambulatory Visit: Payer: BLUE CROSS/BLUE SHIELD | Admitting: Family Medicine

## 2016-11-17 ENCOUNTER — Telehealth: Payer: Self-pay | Admitting: Cardiology

## 2016-11-17 NOTE — Telephone Encounter (Signed)
New message    Pt c/o swelling: STAT is pt has developed SOB within 24 hours  1. How long have you been experiencing swelling? Since last week  2. Where is the swelling located? Thighs to toes  3.  Are you currently taking a "fluid pill"? Yes, pt states he increased his pill per his PCP.  4.  Are you currently SOB? Yes-can not hear on phone  5.  Have you traveled recently? No  Appt scheduled with Dr. Marlou Porch tomorrow 3/16 at 230pm

## 2016-11-17 NOTE — Telephone Encounter (Signed)
Spoke with pt who is reporting increase in edema from mid-thighs to toes and SOB.  He has not been weighing daily.  Denies the use of foods high in NA+ - no fast foods, processed, frozen or canned food.  He has seen his PCP recently who is treating him for CHF and had increased his furosemide to 80 mg TID.  Pt states this has not helped and he feels it is worse.  Advised to call PCP back to make them aware but he refuses to do so.  He has been scheduled to see Dr Gillian Shields tomorrow in the office.  He was advised to continue medications as instructed by his PCP, reminder to wt daily and limit NA intake.  He was also advised to report to the ED for evaluation if SOB increases.  He states understanding.

## 2016-11-18 ENCOUNTER — Inpatient Hospital Stay (HOSPITAL_COMMUNITY)
Admission: AD | Admit: 2016-11-18 | Discharge: 2016-11-28 | DRG: 246 | Disposition: A | Discharge status: Home / Self Care | Payer: BLUE CROSS/BLUE SHIELD | Source: Ambulatory Visit | Attending: Cardiology | Admitting: Cardiology

## 2016-11-18 ENCOUNTER — Ambulatory Visit (INDEPENDENT_AMBULATORY_CARE_PROVIDER_SITE_OTHER): Payer: BLUE CROSS/BLUE SHIELD

## 2016-11-18 ENCOUNTER — Encounter (HOSPITAL_COMMUNITY): Payer: Self-pay

## 2016-11-18 ENCOUNTER — Encounter: Payer: Self-pay | Admitting: Cardiology

## 2016-11-18 ENCOUNTER — Ambulatory Visit (INDEPENDENT_AMBULATORY_CARE_PROVIDER_SITE_OTHER): Payer: BLUE CROSS/BLUE SHIELD | Admitting: Cardiology

## 2016-11-18 VITALS — BP 142/100 | HR 100 | Ht 68.0 in | Wt 208.0 lb

## 2016-11-18 DIAGNOSIS — Z86718 Personal history of other venous thrombosis and embolism: Secondary | ICD-10-CM | POA: Diagnosis not present

## 2016-11-18 DIAGNOSIS — Z9049 Acquired absence of other specified parts of digestive tract: Secondary | ICD-10-CM

## 2016-11-18 DIAGNOSIS — Z7901 Long term (current) use of anticoagulants: Secondary | ICD-10-CM | POA: Diagnosis not present

## 2016-11-18 DIAGNOSIS — R0602 Shortness of breath: Secondary | ICD-10-CM | POA: Diagnosis present

## 2016-11-18 DIAGNOSIS — Z8249 Family history of ischemic heart disease and other diseases of the circulatory system: Secondary | ICD-10-CM | POA: Diagnosis not present

## 2016-11-18 DIAGNOSIS — I2510 Atherosclerotic heart disease of native coronary artery without angina pectoris: Secondary | ICD-10-CM | POA: Diagnosis present

## 2016-11-18 DIAGNOSIS — Z95810 Presence of automatic (implantable) cardiac defibrillator: Secondary | ICD-10-CM

## 2016-11-18 DIAGNOSIS — R570 Cardiogenic shock: Secondary | ICD-10-CM | POA: Diagnosis present

## 2016-11-18 DIAGNOSIS — Z823 Family history of stroke: Secondary | ICD-10-CM | POA: Diagnosis not present

## 2016-11-18 DIAGNOSIS — I5022 Chronic systolic (congestive) heart failure: Secondary | ICD-10-CM | POA: Diagnosis not present

## 2016-11-18 DIAGNOSIS — I5023 Acute on chronic systolic (congestive) heart failure: Secondary | ICD-10-CM | POA: Diagnosis present

## 2016-11-18 DIAGNOSIS — E7800 Pure hypercholesterolemia, unspecified: Secondary | ICD-10-CM | POA: Diagnosis present

## 2016-11-18 DIAGNOSIS — Z7982 Long term (current) use of aspirin: Secondary | ICD-10-CM | POA: Diagnosis not present

## 2016-11-18 DIAGNOSIS — I110 Hypertensive heart disease with heart failure: Secondary | ICD-10-CM

## 2016-11-18 DIAGNOSIS — I214 Non-ST elevation (NSTEMI) myocardial infarction: Secondary | ICD-10-CM | POA: Diagnosis present

## 2016-11-18 DIAGNOSIS — Z955 Presence of coronary angioplasty implant and graft: Secondary | ICD-10-CM

## 2016-11-18 DIAGNOSIS — Z79899 Other long term (current) drug therapy: Secondary | ICD-10-CM | POA: Diagnosis not present

## 2016-11-18 DIAGNOSIS — I10 Essential (primary) hypertension: Secondary | ICD-10-CM | POA: Diagnosis not present

## 2016-11-18 DIAGNOSIS — K219 Gastro-esophageal reflux disease without esophagitis: Secondary | ICD-10-CM | POA: Diagnosis present

## 2016-11-18 DIAGNOSIS — N179 Acute kidney failure, unspecified: Secondary | ICD-10-CM

## 2016-11-18 DIAGNOSIS — I5021 Acute systolic (congestive) heart failure: Secondary | ICD-10-CM | POA: Diagnosis present

## 2016-11-18 DIAGNOSIS — Z794 Long term (current) use of insulin: Secondary | ICD-10-CM | POA: Diagnosis not present

## 2016-11-18 DIAGNOSIS — I255 Ischemic cardiomyopathy: Secondary | ICD-10-CM | POA: Diagnosis not present

## 2016-11-18 DIAGNOSIS — K3184 Gastroparesis: Secondary | ICD-10-CM | POA: Diagnosis present

## 2016-11-18 DIAGNOSIS — I25110 Atherosclerotic heart disease of native coronary artery with unstable angina pectoris: Secondary | ICD-10-CM | POA: Diagnosis not present

## 2016-11-18 DIAGNOSIS — I252 Old myocardial infarction: Secondary | ICD-10-CM | POA: Diagnosis not present

## 2016-11-18 DIAGNOSIS — E1143 Type 2 diabetes mellitus with diabetic autonomic (poly)neuropathy: Secondary | ICD-10-CM | POA: Diagnosis present

## 2016-11-18 DIAGNOSIS — E785 Hyperlipidemia, unspecified: Secondary | ICD-10-CM | POA: Diagnosis present

## 2016-11-18 DIAGNOSIS — I509 Heart failure, unspecified: Secondary | ICD-10-CM | POA: Diagnosis not present

## 2016-11-18 LAB — COMPREHENSIVE METABOLIC PANEL
ALT: 27 U/L (ref 17–63)
ANION GAP: 11 (ref 5–15)
AST: 60 U/L — AB (ref 15–41)
Albumin: 3.1 g/dL — ABNORMAL LOW (ref 3.5–5.0)
Alkaline Phosphatase: 157 U/L — ABNORMAL HIGH (ref 38–126)
BUN: 29 mg/dL — AB (ref 6–20)
CHLORIDE: 104 mmol/L (ref 101–111)
CO2: 23 mmol/L (ref 22–32)
Calcium: 8.6 mg/dL — ABNORMAL LOW (ref 8.9–10.3)
Creatinine, Ser: 1.35 mg/dL — ABNORMAL HIGH (ref 0.61–1.24)
GFR, EST NON AFRICAN AMERICAN: 59 mL/min — AB (ref >60)
Glucose, Bld: 211 mg/dL — ABNORMAL HIGH (ref 65–99)
POTASSIUM: 4.2 mmol/L (ref 3.5–5.1)
Sodium: 138 mmol/L (ref 135–145)
TOTAL PROTEIN: 7.6 g/dL (ref 6.5–8.1)
Total Bilirubin: 0.7 mg/dL (ref 0.3–1.2)

## 2016-11-18 LAB — CBC WITH DIFFERENTIAL/PLATELET
Basophils Absolute: 0 10*3/uL (ref 0.0–0.1)
Basophils Relative: 0 %
EOS PCT: 1 %
Eosinophils Absolute: 0.1 10*3/uL (ref 0.0–0.7)
HEMATOCRIT: 36.5 % — AB (ref 39.0–52.0)
HEMOGLOBIN: 11.6 g/dL — AB (ref 13.0–17.0)
LYMPHS ABS: 1.3 10*3/uL (ref 0.7–4.0)
LYMPHS PCT: 20 %
MCH: 24.9 pg — AB (ref 26.0–34.0)
MCHC: 31.8 g/dL (ref 30.0–36.0)
MCV: 78.3 fL (ref 78.0–100.0)
Monocytes Absolute: 0.4 10*3/uL (ref 0.1–1.0)
Monocytes Relative: 6 %
NEUTROS ABS: 4.8 10*3/uL (ref 1.7–7.7)
NEUTROS PCT: 73 %
PLATELETS: 246 10*3/uL (ref 150–400)
RBC: 4.66 MIL/uL (ref 4.22–5.81)
RDW: 16.9 % — ABNORMAL HIGH (ref 11.5–15.5)
WBC: 6.6 10*3/uL (ref 4.0–10.5)

## 2016-11-18 LAB — GLUCOSE, CAPILLARY
GLUCOSE-CAPILLARY: 182 mg/dL — AB (ref 65–99)
GLUCOSE-CAPILLARY: 141 mg/dL — AB (ref 65–99)

## 2016-11-18 LAB — TSH: TSH: 2.149 u[IU]/mL (ref 0.350–4.500)

## 2016-11-18 LAB — PROTIME-INR
INR: 1.22
PROTHROMBIN TIME: 15.4 s — AB (ref 11.4–15.2)

## 2016-11-18 LAB — BRAIN NATRIURETIC PEPTIDE: B Natriuretic Peptide: 2048.7 pg/mL — ABNORMAL HIGH (ref 0.0–100.0)

## 2016-11-18 MED ORDER — FUROSEMIDE 10 MG/ML IJ SOLN
80.0000 mg | Freq: Three times a day (TID) | INTRAMUSCULAR | Status: DC
  Administered 2016-11-18 – 2016-11-20 (×6): 80 mg via INTRAVENOUS
  Filled 2016-11-18 (×6): qty 8

## 2016-11-18 MED ORDER — INSULIN ASPART 100 UNIT/ML ~~LOC~~ SOLN
0.0000 [IU] | Freq: Every day | SUBCUTANEOUS | Status: DC
  Administered 2016-11-24: 2 [IU] via SUBCUTANEOUS
  Administered 2016-11-25: 3 [IU] via SUBCUTANEOUS

## 2016-11-18 MED ORDER — SODIUM CHLORIDE 0.9% FLUSH
3.0000 mL | Freq: Two times a day (BID) | INTRAVENOUS | Status: DC
  Administered 2016-11-18 – 2016-11-21 (×4): 3 mL via INTRAVENOUS
  Administered 2016-11-22: 10 mL via INTRAVENOUS
  Administered 2016-11-22 – 2016-11-25 (×3): 3 mL via INTRAVENOUS

## 2016-11-18 MED ORDER — DIGOXIN 125 MCG PO TABS
0.1250 mg | ORAL_TABLET | Freq: Every day | ORAL | Status: DC
  Administered 2016-11-19 – 2016-11-28 (×10): 0.125 mg via ORAL
  Filled 2016-11-18 (×10): qty 1

## 2016-11-18 MED ORDER — WARFARIN - PHARMACIST DOSING INPATIENT: Freq: Every day | Status: DC

## 2016-11-18 MED ORDER — CARVEDILOL 25 MG PO TABS
25.0000 mg | ORAL_TABLET | Freq: Two times a day (BID) | ORAL | Status: DC
  Administered 2016-11-18 – 2016-11-20 (×4): 25 mg via ORAL
  Filled 2016-11-18 (×5): qty 1

## 2016-11-18 MED ORDER — WARFARIN SODIUM 10 MG PO TABS
10.0000 mg | ORAL_TABLET | Freq: Once | ORAL | Status: AC
  Administered 2016-11-18: 10 mg via ORAL
  Filled 2016-11-18: qty 1

## 2016-11-18 MED ORDER — ALPRAZOLAM 0.25 MG PO TABS: 0.2500 mg | ORAL_TABLET | Freq: Two times a day (BID) | ORAL | Status: DC | PRN

## 2016-11-18 MED ORDER — ACETAMINOPHEN 325 MG PO TABS
650.0000 mg | ORAL_TABLET | ORAL | Status: DC | PRN
  Administered 2016-11-20: 650 mg via ORAL
  Filled 2016-11-18: qty 2

## 2016-11-18 MED ORDER — ORAL CARE MOUTH RINSE
15.0000 mL | Freq: Two times a day (BID) | OROMUCOSAL | Status: DC
  Administered 2016-11-20 – 2016-11-23 (×4): 15 mL via OROMUCOSAL

## 2016-11-18 MED ORDER — SPIRONOLACTONE 25 MG PO TABS
25.0000 mg | ORAL_TABLET | Freq: Every day | ORAL | Status: DC
  Administered 2016-11-19 – 2016-11-20 (×2): 25 mg via ORAL
  Filled 2016-11-18 (×2): qty 1

## 2016-11-18 MED ORDER — SODIUM CHLORIDE 0.9% FLUSH
3.0000 mL | INTRAVENOUS | Status: DC | PRN
  Administered 2016-11-18: 3 mL via INTRAVENOUS
  Filled 2016-11-18: qty 3

## 2016-11-18 MED ORDER — SODIUM CHLORIDE 0.9 % IV SOLN: 250.0000 mL | INTRAVENOUS | Status: DC | PRN

## 2016-11-18 MED ORDER — HYDRALAZINE HCL 25 MG PO TABS
25.0000 mg | ORAL_TABLET | Freq: Three times a day (TID) | ORAL | Status: DC
  Administered 2016-11-18 – 2016-11-20 (×5): 25 mg via ORAL
  Filled 2016-11-18 (×5): qty 1

## 2016-11-18 MED ORDER — INSULIN GLARGINE 100 UNIT/ML ~~LOC~~ SOLN
30.0000 [IU] | Freq: Every morning | SUBCUTANEOUS | Status: DC
  Administered 2016-11-19 – 2016-11-22 (×4): 30 [IU] via SUBCUTANEOUS
  Filled 2016-11-18 (×5): qty 0.3

## 2016-11-18 MED ORDER — POTASSIUM CHLORIDE CRYS ER 20 MEQ PO TBCR
20.0000 meq | EXTENDED_RELEASE_TABLET | Freq: Every day | ORAL | Status: DC
  Administered 2016-11-19 – 2016-11-20 (×2): 20 meq via ORAL
  Filled 2016-11-18: qty 1
  Filled 2016-11-18: qty 2
  Filled 2016-11-18: qty 1

## 2016-11-18 MED ORDER — PANTOPRAZOLE SODIUM 40 MG PO TBEC: 40.0000 mg | DELAYED_RELEASE_TABLET | Freq: Every day | ORAL | Status: DC

## 2016-11-18 MED ORDER — ATORVASTATIN CALCIUM 80 MG PO TABS: 80.0000 mg | ORAL_TABLET | Freq: Every evening | ORAL | Status: DC

## 2016-11-18 MED ORDER — ONDANSETRON HCL 4 MG/2ML IJ SOLN
4.0000 mg | Freq: Four times a day (QID) | INTRAMUSCULAR | Status: DC | PRN
  Administered 2016-11-18 – 2016-11-20 (×4): 4 mg via INTRAVENOUS
  Filled 2016-11-18 (×4): qty 2

## 2016-11-18 MED ORDER — ZOLPIDEM TARTRATE 5 MG PO TABS
5.0000 mg | ORAL_TABLET | Freq: Every evening | ORAL | Status: DC | PRN
  Administered 2016-11-18: 5 mg via ORAL
  Filled 2016-11-18: qty 1

## 2016-11-18 MED ORDER — ISOSORBIDE MONONITRATE ER 60 MG PO TB24
90.0000 mg | ORAL_TABLET | Freq: Every day | ORAL | Status: DC
  Administered 2016-11-19 – 2016-11-20 (×2): 90 mg via ORAL
  Filled 2016-11-18 (×2): qty 1

## 2016-11-18 MED ORDER — INSULIN ASPART 100 UNIT/ML ~~LOC~~ SOLN
0.0000 [IU] | Freq: Three times a day (TID) | SUBCUTANEOUS | Status: DC
  Administered 2016-11-18 – 2016-11-19 (×3): 3 [IU] via SUBCUTANEOUS
  Administered 2016-11-20 (×2): 5 [IU] via SUBCUTANEOUS
  Administered 2016-11-21: 8 [IU] via SUBCUTANEOUS
  Administered 2016-11-21 – 2016-11-24 (×5): 3 [IU] via SUBCUTANEOUS
  Administered 2016-11-24: 5 [IU] via SUBCUTANEOUS
  Administered 2016-11-24 – 2016-11-25 (×2): 2 [IU] via SUBCUTANEOUS
  Administered 2016-11-26 (×2): 3 [IU] via SUBCUTANEOUS
  Administered 2016-11-26 – 2016-11-28 (×4): 2 [IU] via SUBCUTANEOUS

## 2016-11-18 NOTE — Progress Notes (Signed)
Cardiology history and physical    Date:  11/18/2016   ID:  [name], DOB September 01, 1966, MRN [id number]  PCP:  Darci Needle, MD  Cardiologist:   Candee Furbish, MD   Chief complaint: Shortness of breath  History of Present Illness:  [name] is a 51 y.o. male here with acute On chronic systolic heart failure, diabetes, hypertension, ischemic cardiomyopathy ejection fraction 30%, ICD, on chronic Coumadin therapy secondary to prior DVT here with shortness of breath.  He is once again feeling nausea, orthopnea, early satiety, shortness of breath with minimal activity, NYHA 4 currently stage IV heart failure. He has been twice to the family medicine clinic recently. They have up to his Lasix to 80 mg 3 times a day. Unfortunately he is not seeing any increase in urination. He is trying to limit his fluid intake and reports no salt intake. This has been going on over the past 2-3 weeks and has been worsening in frequency and symptoms. He denies any chest discomfort. No syncope. No ICD discharges. His fluid has been increasing feet, mid thighs, now his belly.  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  His ICD impedance has been increased recently indicative of fluid volume.    Past Medical History:  Diagnosis Date  . Angina   . ASCVD (arteriosclerotic cardiovascular disease)    , Anterior infarction 2005, LAD diagonal bifurcation intervention 03/2004  . Automatic implantable cardiac defibrillator -[employer]. Jude[employer]       . Benign neoplasm of colon   . Chronic systolic heart failure ([place])   . Coronary artery disease     Widely patent previously placed stents in the left anterior   . Crohn's disease ([place])   . 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 Gastroparesis   . GERD (gastroesophageal reflux disease)   . High cholesterol   . Hyperlipidemia   . Hypersomnolent    Previous diagnosis of narcolepsy  . Hypertension,  essential   . Ischemic cardiomyopathy    Ejection fraction 15-20% catheterization 2010  . Type II or unspecified type diabetes mellitus without mention of complication, not stated as uncontrolled   . Unspecified gastritis and gastroduodenitis without mention of hemorrhage     Past Surgical History:  Procedure Laterality Date  . APPENDECTOMY    . CARDIAC DEFIBRILLATOR PLACEMENT  2010   St. Jude ICD  . COLECTOMY     "for Crohn's"  . COLON SURGERY    . EP IMPLANTABLE DEVICE N/A 09/14/2016   Procedure: ICD Generator Changeout;  Surgeon: Deboraha Sprang, MD;  Location: [facility];  Service: Cardiovascular;  Laterality: N/A;  . FETAL SURGERY FOR CONGENITAL HERNIA    . INGUINAL HERNIA REPAIR      Current Medications: Outpatient Medications Prior to Visit  Medication Sig Dispense Refill  . aspirin 81 MG tablet Take 81 mg by mouth daily.    [name] atorvastatin (LIPITOR) 80 MG tablet Take 1 tablet (80 mg total) by mouth 3 (three) times a week. (Patient taking differently: Take 80 mg by mouth every evening. ) 30 tablet 11  . carvedilol (COREG) 25 MG tablet Take 1 tablet (25 mg total) by mouth 2 (two) times daily with a meal. 60 tablet 11  . digoxin (LANOXIN) 0.125 MG tablet Take 1 tablet (0.125 mg total) by mouth daily. 30 tablet 11  . esomeprazole (NEXIUM) 40 MG capsule Take 40 mg by mouth daily as  needed (for acid reflux).     . hydrALAZINE (APRESOLINE) 25 MG tablet Take 1 tablet (25 mg total) by mouth 3 (three) times daily. 90 tablet 11  . insulin glargine (LANTUS) 100 UNIT/ML injection Inject 0.3 mLs (30 Units total) into the skin every morning. Sliding scale. (Patient taking differently: Inject 30 Units into the skin every morning. ) 10 mL 11  . isosorbide mononitrate (IMDUR) 60 MG 24 hr tablet Take 1.5 tablets (90 mg total) by mouth daily. 45 tablet 11  . nitroGLYCERIN (NITROSTAT) 0.4 MG SL tablet Place 1 tablet (0.4 mg total) under the tongue every 5 (five) minutes as needed for chest  pain. 25 tablet 3  . ondansetron (ZOFRAN) 4 MG tablet Take 1 tablet (4 mg total) by mouth every 8 (eight) hours as needed for nausea or vomiting. 20 tablet 0  . potassium chloride (K-DUR) 10 MEQ tablet Take 2 tablets (20 mEq total) by mouth daily. 60 tablet 11  . spironolactone (ALDACTONE) 25 MG tablet Take 1 tablet (25 mg total) by mouth daily. 30 tablet 11  . warfarin (COUMADIN) 5 MG tablet Take as directed by [facility] Clinic 35 tablet 0  . furosemide (LASIX) 80 MG tablet TAKE 1 TABLET (80 MG TOTAL) BY MOUTH 2 (TWO) TIMES DAILY. (Patient not taking: Reported on 11/18/2016) 60 tablet 7   No facility-administered medications prior to visit.      Allergies:   Metformin and related   Social History   Social History  . Marital status: Married    Spouse name: N/A  . Number of children: N/A  . Years of education: N/A   Social History Main Topics  . Smoking status: Never Smoker  . Smokeless tobacco: Never Used  . Alcohol use No  . Drug use: No  . Sexual activity: No   Other Topics Concern  . None   Social History Narrative  . None     Family History:  The patient's family history includes Heart attack in his father; Stroke in his paternal grandfather.   ROS:   Please see the history of present illness.    ROS All other systems reviewed and are negative.   PHYSICAL EXAM:   VS:  BP (!) 142/100   Pulse 100   Ht 5' 8"  (1.727 m)   Wt 208 lb (94.3 kg)   SpO2 98%   BMI 31.63 kg/m    GEN: Well nourished, well developed, Sitting in wheelchair, I'll shortness of breath noted at rest HEENT: normal  Neck: + JVD to ear lobe, no carotid bruits, or masses Cardiac: RRR +S3; no murmurs, rubs, 3+ lateral lower extremity edema  Respiratory:  Crackles noted at bases bilaterally with diminished breath sounds right lower lobe, increased work of breathing GI: soft, nontender, nondistended, + BS MS: no deformity or atrophy  Skin: warm and dry, no rash Neuro:  Alert and Oriented x 3,  Strength and sensation are intact Psych: euthymic mood, full affect  Wt Readings from Last 3 Encounters:  11/18/16 208 lb (94.3 kg)  11/10/16 195 lb 12.8 oz (88.8 kg)  11/09/16 196 lb (88.9 kg)      Studies/Labs Reviewed:   EKG:  08/20/16-heart rate 82, inferior infarct pattern, sinus rhythm.  Recent Labs: 07/13/2016: ALT 10 08/22/2016: Hemoglobin 9.2 09/07/2016: Platelets 279 11/09/2016: Brain Natriuretic Peptide 383.5; BUN 37; Creat 1.63; Potassium 4.7; Sodium 137; TSH 3.04   Lipid Panel    Component Value Date/Time   CHOL 226 (H) 11/18/2011 0530  TRIG 81 11/18/2011 0530   HDL 64 11/18/2011 0530   CHOLHDL 3.5 11/18/2011 0530   VLDL 16 11/18/2011 0530   LDLCALC 146 (H) 11/18/2011 0530    Additional studies/ records that were reviewed today include:   Prior office notes, lab work, echocardiogram, EKG reviewed  Echocardiogram 08/03/16:  - Left ventricle: The cavity size was moderately dilated. Wall   thickness was normal. Systolic function was severely reduced. The   estimated ejection fraction was in the range of 25% to 30%.   Diffuse hypokinesis. Features are consistent with a pseudonormal   left ventricular filling pattern, with concomitant abnormal   relaxation and increased filling pressure (grade 2 diastolic   dysfunction). Doppler parameters are consistent with high   ventricular filling pressure. - Mitral valve: There was mild regurgitation. - Right ventricle: The cavity size was mildly dilated. Systolic   function was moderately reduced. - Pulmonary arteries: Systolic pressure was moderately to severely   increased.  Impressions:  - Severe global reduction in LV systolic function; grade 2   diastolic dysfunction with elevated LV filling pressure; moderate   LVE; mild MR; mild RVE with moderately reduced function; mild TR   with moderate to severe elevation in pulmonary pressure; cannot   R/O LV apical thrombus; suggest FU study with  definity.  Echocardiogram 08/04/16 limited with Definity: Left ventricle:  Limited echo with Definity. LVEF is severely depressed at approximately 30% with severe hypokinesis of inferior, inferoseptal, posterior walls;a apical akinesis.  Chest x-ray 11/09/16: IMPRESSION: 1. Moderate right pleural effusion and pulmonary vascular congestion. 2. Cardiac enlargement.  Cardiac catheterization on 12/15/2008:  FINDINGS:  1. Left main artery - widely patent.  2. Left anterior descending artery - there are two main diagonal      branches, then the left anterior descending artery continues to      wrap around the apex.  The previously placed stents, the first      intervention being the LAD/D2 bifurcation with two kissing stents      are widely patent.  The third intervention being the first diagonal      branch, mid section is also widely patent.  There is approximately      40% mid to distal LAD disease distal to the previously placed      stent, which appears nonobstructive.  There are minor luminal      irregularities throughout these vessels.  3. Circumflex artery - so large caliber vessel giving rise to two      obtuse marginal branches with minor irregularities throughout.  4. Right coronary artery - this is a dominant vessel giving rise to      the PDA.  There is mid 40% stenosis.  Otherwise minor      irregularities throughout.  5. Left ventriculogram - ejection fraction is severely decreased at      approximately 15-20% with akinesis of the entire inferior wall      (prior scar seen on nuclear stress test).  There does not appear to      be any objective evidence of thrombus.  No significant mitral      regurgitation is present.   HEMODYNAMICS:  Left ventricular pressure is 116 with an end-diastolic  pressure of 20.  Aortic pressure 117/84 with a mean of 98 mmHg.   IMPRESSIONS:  1. Widely patent previously placed stents in the left anterior      descending coronary  artery/second diagonal branch as well as first  diagonal branch with 40% mid to distal left anterior descending      coronary artery stenosis distal to the previously placed stent.  2. A 40% mid right coronary artery stenosis - non-flow limiting.  3. Severely decreased ejection fraction of approximately 15-20% with      akinesis of the entire inferior wall with otherwise severe global      hypokinesis.  4. Elevated left ventricular end-diastolic pressure.   ASSESSMENT:    1. Acute on chronic systolic heart failure ([place])   2. Hypertensive heart disease with heart failure ([place])   3. Automatic implantable cardioverter-defibrillator in situ   4. Coronary artery disease involving native coronary artery of native heart without angina pectoris      PLAN:  In order of problems listed above:  Acute on chronic systolic heart failure/ischemic cardiomyopathy  - Admit to hospital  - IV Lasix 80 mg 3 times a day  - Monitor closely basic metabolic profile  - Prior dry weight approximately 185 pounds  - Telemetry monitoring  - Continue spironolactone 25 mg daily. Digoxin 0.125 mg a day. Carvedilol 25 mg twice a day (blood pressure currently increased), hydralazine 25 mg 3 times a day. Isosorbide 60 mg a day.  - Lisinopril 20 mg has recently been discontinued by the emergency department in December 2017. His creatinine has been ranging 1.63-1.47. We may be able to add this back as diuresis allows.   Hypertensive heart disease with heart failure  - Diuresis. Continue antihypertensives as above.  ICD  - Recent upgrade, Dr. Caryl Comes.  Coronary artery disease  - Previously patent stents. No anginal symptoms.  We'll see post hospital discharge.   Medication Adjustments/Labs and Tests Ordered: Current medicines are reviewed at length with the patient today.  Concerns regarding medicines are outlined above.  Medication changes, Labs and Tests ordered today are listed in the Patient Instructions  below. There are no Patient Instructions on file for this visit.   Signed, Candee Furbish, MD  11/18/2016 3:31 PM    [facility] [place], [place], [place]  [address] Phone: [phone]; Fax: [phone]

## 2016-11-18 NOTE — H&P (Signed)
Cardiology history and physical    Date:  11/18/2016   ID:  [name], DOB 05-Apr-1966, MRN [id number]  PCP:  Darci Needle, MD           Cardiologist:   Candee Furbish, MD   Chief complaint: Shortness of breath  History of Present Illness:  [name] is a 51 y.o. male here with acute On chronic systolic heart failure, diabetes, hypertension, ischemic cardiomyopathy ejection fraction 30%, ICD, on chronic Coumadin therapy secondary to prior DVT here with shortness of breath.  He is once again feeling nausea, orthopnea, early satiety, shortness of breath with minimal activity, NYHA 4 currently stage IV heart failure. He has been twice to the family medicine clinic recently. They have up to his Lasix to 80 mg 3 times a day. Unfortunately he is not seeing any increase in urination. He is trying to limit his fluid intake and reports no salt intake. This has been going on over the past 2-3 weeks and has been worsening in frequency and symptoms. He denies any chest discomfort. No syncope. No ICD discharges. His fluid has been increasing feet, mid thighs, now his belly.  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  His ICD impedance has been increased recently indicative of fluid volume.        Past Medical History:  Diagnosis Date  . Angina   . ASCVD (arteriosclerotic cardiovascular disease)    , Anterior infarction 2005, LAD diagonal bifurcation intervention 03/2004  . Automatic implantable cardiac defibrillator -St. Jude's       . Benign neoplasm of colon   . Chronic systolic heart failure ([place])   . Coronary artery disease     Widely patent previously placed stents in the left anterior   . Crohn's disease ([place])   . 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 Gastroparesis   . GERD (gastroesophageal reflux disease)   . High cholesterol   . Hyperlipidemia   . Hypersomnolent    Previous  diagnosis of narcolepsy  . Hypertension, essential   . Ischemic cardiomyopathy    Ejection fraction 15-20% catheterization 2010  . Type II or unspecified type diabetes mellitus without mention of complication, not stated as uncontrolled   . Unspecified gastritis and gastroduodenitis without mention of hemorrhage          Past Surgical History:  Procedure Laterality Date  . APPENDECTOMY    . CARDIAC DEFIBRILLATOR PLACEMENT  2010   St. Jude ICD  . COLECTOMY     "for Crohn's"  . COLON SURGERY    . EP IMPLANTABLE DEVICE N/A 09/14/2016   Procedure: ICD Generator Changeout;  Surgeon: Deboraha Sprang, MD;  Location: [facility];  Service: Cardiovascular;  Laterality: N/A;  . FETAL SURGERY FOR CONGENITAL HERNIA    . INGUINAL HERNIA REPAIR      Current Medications:       Outpatient Medications Prior to Visit  Medication Sig Dispense Refill  . aspirin 81 MG tablet Take 81 mg by mouth daily.    [name] atorvastatin (LIPITOR) 80 MG tablet Take 1 tablet (80 mg total) by mouth 3 (three) times a week. (Patient taking differently: Take 80 mg by mouth every evening. ) 30 tablet 11  . carvedilol (COREG) 25 MG tablet Take 1 tablet (25 mg total) by mouth 2 (two) times daily with a meal. 60 tablet 11  . digoxin (LANOXIN) 0.125 MG tablet  Take 1 tablet (0.125 mg total) by mouth daily. 30 tablet 11  . esomeprazole (NEXIUM) 40 MG capsule Take 40 mg by mouth daily as needed (for acid reflux).     . hydrALAZINE (APRESOLINE) 25 MG tablet Take 1 tablet (25 mg total) by mouth 3 (three) times daily. 90 tablet 11  . insulin glargine (LANTUS) 100 UNIT/ML injection Inject 0.3 mLs (30 Units total) into the skin every morning. Sliding scale. (Patient taking differently: Inject 30 Units into the skin every morning. ) 10 mL 11  . isosorbide mononitrate (IMDUR) 60 MG 24 hr tablet Take 1.5 tablets (90 mg total) by mouth daily. 45 tablet 11  . nitroGLYCERIN (NITROSTAT) 0.4 MG SL tablet Place 1  tablet (0.4 mg total) under the tongue every 5 (five) minutes as needed for chest pain. 25 tablet 3  . ondansetron (ZOFRAN) 4 MG tablet Take 1 tablet (4 mg total) by mouth every 8 (eight) hours as needed for nausea or vomiting. 20 tablet 0  . potassium chloride (K-DUR) 10 MEQ tablet Take 2 tablets (20 mEq total) by mouth daily. 60 tablet 11  . spironolactone (ALDACTONE) 25 MG tablet Take 1 tablet (25 mg total) by mouth daily. 30 tablet 11  . warfarin (COUMADIN) 5 MG tablet Take as directed by Coumadin Clinic 35 tablet 0  . furosemide (LASIX) 80 MG tablet TAKE 1 TABLET (80 MG TOTAL) BY MOUTH 2 (TWO) TIMES DAILY. (Patient not taking: Reported on 11/18/2016) 60 tablet 7   No facility-administered medications prior to visit.      Allergies:   Metformin and related   Social History        Social History  . Marital status: Married    Spouse name: N/A  . Number of children: N/A  . Years of education: N/A       Social History Main Topics  . Smoking status: Never Smoker  . Smokeless tobacco: Never Used  . Alcohol use No  . Drug use: No  . Sexual activity: No       Other Topics Concern  . None      Social History Narrative  . None     Family History:  The patient's family history includes Heart attack in his father; Stroke in his paternal grandfather.   ROS:   Please see the history of present illness.    ROS All other systems reviewed and are negative.   PHYSICAL EXAM:   VS:  BP (!) 142/100   Pulse 100   Ht 5' 8"  (1.727 m)   Wt 208 lb (94.3 kg)   SpO2 98%   BMI 31.63 kg/m    GEN: Well nourished, well developed, Sitting in wheelchair, I'll shortness of breath noted at rest HEENT: normal  Neck: + JVD to ear lobe, no carotid bruits, or masses Cardiac: RRR +S3; no murmurs, rubs, 3+ lateral lower extremity edema  Respiratory:  Crackles noted at bases bilaterally with diminished breath sounds right lower lobe, increased work of breathing GI: soft, nontender,  nondistended, + BS MS: no deformity or atrophy  Skin: warm and dry, no rash Neuro:  Alert and Oriented x 3, Strength and sensation are intact Psych: euthymic mood, full affect     Wt Readings from Last 3 Encounters:  11/18/16 208 lb (94.3 kg)  11/10/16 195 lb 12.8 oz (88.8 kg)  11/09/16 196 lb (88.9 kg)      Studies/Labs Reviewed:   EKG:  08/20/16-heart rate 82, inferior infarct pattern, sinus rhythm.  Recent Labs: 07/13/2016: ALT 10 08/22/2016: Hemoglobin 9.2 09/07/2016: Platelets 279 11/09/2016: Brain Natriuretic Peptide 383.5; BUN 37; Creat 1.63; Potassium 4.7; Sodium 137; TSH 3.04   Lipid Panel Labs (Brief)          Component Value Date/Time   CHOL 226 (H) 11/18/2011 0530   TRIG 81 11/18/2011 0530   HDL 64 11/18/2011 0530   CHOLHDL 3.5 11/18/2011 0530   VLDL 16 11/18/2011 0530   LDLCALC 146 (H) 11/18/2011 0530      Additional studies/ records that were reviewed today include:   Prior office notes, lab work, echocardiogram, EKG reviewed  Echocardiogram 08/03/16:  - Left ventricle: The cavity size was moderately dilated. Wall thickness was normal. Systolic function was severely reduced. The estimated ejection fraction was in the range of 25% to 30%. Diffuse hypokinesis. Features are consistent with a pseudonormal left ventricular filling pattern, with concomitant abnormal relaxation and increased filling pressure (grade 2 diastolic dysfunction). Doppler parameters are consistent with high ventricular filling pressure. - Mitral valve: There was mild regurgitation. - Right ventricle: The cavity size was mildly dilated. Systolic function was moderately reduced. - Pulmonary arteries: Systolic pressure was moderately to severely increased.  Impressions:  - Severe global reduction in LV systolic function; grade 2 diastolic dysfunction with elevated LV filling pressure; moderate LVE; mild MR; mild RVE with moderately reduced  function; mild TR with moderate to severe elevation in pulmonary pressure; cannot R/O LV apical thrombus; suggest FU study with definity.  Echocardiogram 08/04/16 limited with Definity: Left ventricle: Limited echo with Definity. LVEF is severely depressed at approximately 30% with severe hypokinesis of inferior, inferoseptal, posterior walls;a apical akinesis.  Chest x-ray 11/09/16: IMPRESSION: 1. Moderate right pleural effusion and pulmonary vascular congestion. 2. Cardiac enlargement.  Cardiac catheterization on 12/15/2008: FINDINGS: 1. Left main artery - widely patent. 2. Left anterior descending artery - there are two main diagonal branches, then the left anterior descending artery continues to wrap around the apex. The previously placed stents, the first intervention being the LAD/D2 bifurcation with two kissing stents are widely patent. The third intervention being the first diagonal branch, mid section is also widely patent. There is approximately 40% mid to distal LAD disease distal to the previously placed stent, which appears nonobstructive. There are minor luminal irregularities throughout these vessels. 3. Circumflex artery - so large caliber vessel giving rise to two obtuse marginal branches with minor irregularities throughout. 4. Right coronary artery - this is a dominant vessel giving rise to the PDA. There is mid 40% stenosis. Otherwise minor irregularities throughout. 5. Left ventriculogram - ejection fraction is severely decreased at approximately 15-20% with akinesis of the entire inferior wall (prior scar seen on nuclear stress test). There does not appear to be any objective evidence of thrombus. No significant mitral regurgitation is present.  HEMODYNAMICS: Left ventricular pressure is 116 with an end-diastolic pressure of 20. Aortic pressure 117/84 with a  mean of 98 mmHg.  IMPRESSIONS: 1. Widely patent previously placed stents in the left anterior descending coronary artery/second diagonal branch as well as first diagonal branch with 40% mid to distal left anterior descending coronary artery stenosis distal to the previously placed stent. 2. A 40% mid right coronary artery stenosis - non-flow limiting. 3. Severely decreased ejection fraction of approximately 15-20% with akinesis of the entire inferior wall with otherwise severe global hypokinesis. 4. Elevated left ventricular end-diastolic pressure.   ASSESSMENT:    1. Acute on chronic systolic heart failure ([place])   2. Hypertensive heart  disease with heart failure ([place])   3. Automatic implantable cardioverter-defibrillator in situ   4. Coronary artery disease involving native coronary artery of native heart without angina pectoris      PLAN:  In order of problems listed above:  Acute on chronic systolic heart failure/ischemic cardiomyopathy  - Admit to hospital  - IV Lasix 80 mg 3 times a day  - Monitor closely basic metabolic profile  - Prior dry weight approximately 185 pounds  - Telemetry monitoring  - Continue spironolactone 25 mg daily. Digoxin 0.125 mg a day. Carvedilol 25 mg twice a day (blood pressure currently increased), hydralazine 25 mg 3 times a day. Isosorbide 60 mg a day.  - Lisinopril 20 mg has recently been discontinued by the emergency department in December 2017. His creatinine has been ranging 1.63-1.47. We may be able to add this back as diuresis allows.   Hypertensive heart disease with heart failure  - Diuresis. Continue antihypertensives as above.  ICD  - Recent upgrade, Dr. Caryl Comes.  Coronary artery disease  - Previously patent stents. No anginal symptoms.  We'll see post hospital discharge.   Medication Adjustments/Labs and Tests Ordered: Current medicines are reviewed at length with the patient  today.  Concerns regarding medicines are outlined above.  Medication changes, Labs and Tests ordered today are listed in the Patient Instructions below. There are no Patient Instructions on file for this visit.   Signed, Candee Furbish, MD  11/18/2016 3:31 PM    [facility] [place], [place], [place]  [address] Phone: [phone]; Fax: [phone]

## 2016-11-18 NOTE — Progress Notes (Addendum)
ANTICOAGULATION CONSULT NOTE - Initial Consult  Pharmacy Consult for Warfarin Indication: DVT history  Allergies  Allergen Reactions  . Metformin And Related Diarrhea    Patient Measurements: Height: 5' 8"  (172.7 cm) Weight: 208 lb (94.3 kg) IBW/kg (Calculated) : 68.4  Vital Signs: Temp: 97.9 F (36.6 C) (03/16 1619) Temp Source: Oral (03/16 1619) BP: 150/98 (03/16 1619) Pulse Rate: 100 (03/16 1619)  Labs:  Recent Labs  11/18/16 1830  HGB 11.6*  HCT 36.5*  PLT 246  LABPROT 15.4*  INR 1.22  CREATININE 1.35*    Estimated Creatinine Clearance: 71.3 mL/min (A) (by C-G formula based on SCr of 1.35 mg/dL (H)).   Medical History: Past Medical History:  Diagnosis Date  . Angina   . ASCVD (arteriosclerotic cardiovascular disease)    , Anterior infarction 2005, LAD diagonal bifurcation intervention 03/2004  . Automatic implantable cardiac defibrillator -[employer]       . Benign neoplasm of colon   . Chronic systolic heart failure ([place])   . Coronary artery disease     Widely patent previously placed stents in the left anterior   . Crohn's disease ([place])   . 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 Gastroparesis   . GERD (gastroesophageal reflux disease)   . High cholesterol   . Hyperlipidemia   . Hypersomnolent    Previous diagnosis of narcolepsy  . Hypertension, essential   . Ischemic cardiomyopathy    Ejection fraction 15-20% catheterization 2010  . Type II or unspecified type diabetes mellitus without mention of complication, not stated as uncontrolled   . Unspecified gastritis and gastroduodenitis without mention of hemorrhage     Assessment: 51 year old male with history of recurrent DVTs Pharmacy to continue home warfarin, noted patient ran out of medication 3 weeks ago  INR on admission = 1.22, last documented outpatient visit 08/10/16 with dose of 5 mg daily except 7.5 mg on Fridays  Goal of Therapy:  INR 2-3 Monitor platelets by  anticoagulation protocol: Yes   Plan:  Warfarin 10 mg po x 1 dose tonight Daily INR  Thank you Anette Guarneri, PharmD [phone]  11/18/2016,7:18 PM

## 2016-11-18 NOTE — Progress Notes (Signed)
EPIC Encounter for ICM Monitoring  Patient Name: [name] is a 51 y.o. male Date: 11/18/2016 Primary Care Physican: Darci Needle, MD Primary Cardiologist: Marlou Porch Electrophysiologist: Faustino Congress Weight:unknown             Patient being seen in office today by Dr Marlou Porch   Thoracic impedance abnormal suggesting fluid accumulation 11/06/2016 to today, 11/18/2016 with exception of 3 days at baseline.  Prescribed dosage: Furosemide 80 mg 1-2 tablets (80 - 160 mg total) twice a day.    Recommendations: Dr Marlou Porch will make any recommendations needed at office visit today.   Follow-up plan: ICM clinic phone appointment on 11/28/2016 to recheck fluid levels.  Copy of ICM check sent to primary cardiologist and device physician.   3 month ICM trend: 11/18/2016   Direct Trend viewer through 11/18/2016     1 Year ICM trend:      Rosalene Billings, RN 11/18/2016 1:27 PM

## 2016-11-19 DIAGNOSIS — I255 Ischemic cardiomyopathy: Secondary | ICD-10-CM

## 2016-11-19 DIAGNOSIS — E785 Hyperlipidemia, unspecified: Secondary | ICD-10-CM

## 2016-11-19 DIAGNOSIS — I2510 Atherosclerotic heart disease of native coronary artery without angina pectoris: Secondary | ICD-10-CM

## 2016-11-19 DIAGNOSIS — I10 Essential (primary) hypertension: Secondary | ICD-10-CM

## 2016-11-19 LAB — BASIC METABOLIC PANEL
Anion gap: 8 (ref 5–15)
BUN: 38 mg/dL — ABNORMAL HIGH (ref 6–20)
CO2: 22 mmol/L (ref 22–32)
CREATININE: 1.65 mg/dL — AB (ref 0.61–1.24)
Calcium: 8.1 mg/dL — ABNORMAL LOW (ref 8.9–10.3)
Chloride: 101 mmol/L (ref 101–111)
GFR calc non Af Amer: 46 mL/min — ABNORMAL LOW (ref >60)
GFR, EST AFRICAN AMERICAN: 54 mL/min — AB (ref >60)
Glucose, Bld: 149 mg/dL — ABNORMAL HIGH (ref 65–99)
Potassium: 4.8 mmol/L (ref 3.5–5.1)
Sodium: 131 mmol/L — ABNORMAL LOW (ref 135–145)

## 2016-11-19 LAB — GLUCOSE, CAPILLARY
Glucose-Capillary: 103 mg/dL — ABNORMAL HIGH (ref 65–99)
Glucose-Capillary: 159 mg/dL — ABNORMAL HIGH (ref 65–99)
Glucose-Capillary: 171 mg/dL — ABNORMAL HIGH (ref 65–99)
Glucose-Capillary: 162 mg/dL — ABNORMAL HIGH (ref 65–99)

## 2016-11-19 LAB — PROTIME-INR
INR: 1.3
PROTHROMBIN TIME: 16.2 s — AB (ref 11.4–15.2)

## 2016-11-19 LAB — HIV ANTIBODY (ROUTINE TESTING W REFLEX): HIV Screen 4th Generation wRfx: NONREACTIVE

## 2016-11-19 MED ORDER — LOSARTAN POTASSIUM 25 MG PO TABS
12.5000 mg | ORAL_TABLET | Freq: Every day | ORAL | Status: DC
  Administered 2016-11-19 – 2016-11-20 (×2): 12.5 mg via ORAL
  Filled 2016-11-19 (×2): qty 1

## 2016-11-19 MED ORDER — WARFARIN SODIUM 10 MG PO TABS
10.0000 mg | ORAL_TABLET | Freq: Once | ORAL | Status: AC
  Administered 2016-11-19: 10 mg via ORAL
  Filled 2016-11-19: qty 1

## 2016-11-19 MED ORDER — ALUM & MAG HYDROXIDE-SIMETH 200-200-20 MG/5ML PO SUSP
30.0000 mL | ORAL | Status: DC | PRN
  Administered 2016-11-20: 30 mL via ORAL
  Filled 2016-11-19: qty 30

## 2016-11-19 NOTE — Progress Notes (Addendum)
Patient Name: [name] Date of Encounter: 11/19/2016  Primary Cardiologist: Dr. [facility] Problem List     Active Problems:   Acute on chronic systolic heart failure (HCC)     Subjective   Feels better this am.  Has put out 1L overnight. SOB improved but having some nausea  Inpatient Medications    Scheduled Meds: . carvedilol  25 mg Oral BID WC  . digoxin  0.125 mg Oral Daily  . furosemide  80 mg Intravenous TID  . hydrALAZINE  25 mg Oral TID  . insulin aspart  0-15 Units Subcutaneous TID WC  . insulin aspart  0-5 Units Subcutaneous QHS  . insulin glargine  30 Units Subcutaneous q morning - 10a  . isosorbide mononitrate  90 mg Oral Daily  . mouth rinse  15 mL Mouth Rinse BID  . potassium chloride SA  20 mEq Oral Daily  . sodium chloride flush  3 mL Intravenous Q12H  . spironolactone  25 mg Oral Daily  . Warfarin - Pharmacist Dosing Inpatient   Does not apply q1800   Continuous Infusions:  PRN Meds: sodium chloride, acetaminophen, ALPRAZolam, ondansetron (ZOFRAN) IV, sodium chloride flush, zolpidem   Vital Signs    Vitals:   11/18/16 2014 11/19/16 0534 11/19/16 0600 11/19/16 1038  BP: 138/82 102/74    Pulse: 100 78  80  Resp: 18 18    Temp: 97.5 F (36.4 C) 97.4 F (36.3 C)    TempSrc: Oral Oral    SpO2: 100% 97%    Weight:   203 lb 1.6 oz (92.1 kg)   Height:        Intake/Output Summary (Last 24 hours) at 11/19/16 1049 Last data filed at 11/19/16 0000  Gross per 24 hour  Intake                0 ml  Output              900 ml  Net             -900 ml   Filed Weights   11/18/16 1619 11/19/16 0600  Weight: 208 lb (94.3 kg) 203 lb 1.6 oz (92.1 kg)    Physical Exam    GEN: Well nourished, well developed, in no acute distress.  HEENT: Grossly normal.  Neck: Supple, no JVD, carotid bruits, or masses. Cardiac: RRR, no murmurs, rubs, or gallops. No clubbing, cyanosis  Marked LE edema.  Radials/DP/PT 2+ and equal bilaterally.    Respiratory:  Respirations regular and unlabored, clear to auscultation bilaterally. GI: Soft, nontender, nondistended, BS + x 4. MS: 2+ pitting edema of his feet and legs Skin: warm and dry, no rash. Neuro:  Strength and sensation are intact. Psych: AAOx3.  Normal affect.  Labs    CBC  Recent Labs  11/18/16 1830  WBC 6.6  NEUTROABS 4.8  HGB 11.6*  HCT 36.5*  MCV 78.3  PLT 546   Basic Metabolic Panel  Recent Labs  11/18/16 1830  NA 138  K 4.2  CL 104  CO2 23  GLUCOSE 211*  BUN 29*  CREATININE 1.35*  CALCIUM 8.6*   Liver Function Tests  Recent Labs  11/18/16 1830  AST 60*  ALT 27  ALKPHOS 157*  BILITOT 0.7  PROT 7.6  ALBUMIN 3.1*   No results for input(s): LIPASE, AMYLASE in the last 72 hours. Cardiac Enzymes No results for input(s): CKTOTAL, CKMB, CKMBINDEX, TROPONINI in the last 72 hours. BNP  Invalid input(s): POCBNP D-Dimer No results for input(s): DDIMER in the last 72 hours. Hemoglobin A1C No results for input(s): HGBA1C in the last 72 hours. Fasting Lipid Panel No results for input(s): CHOL, HDL, LDLCALC, TRIG, CHOLHDL, LDLDIRECT in the last 72 hours. Thyroid Function Tests  Recent Labs  11/18/16 1830  TSH 2.149    Telemetry    NSR - Personally Reviewed  ECG    NSR - Personally Reviewed  Radiology    No results found.  Cardiac Studies   none  Patient Profile     [name] a 51 y.o.malehere with chronic systolic heart failure, diabetes, hypertension, ischemic cardiomyopathy ejection fraction 30%, ICD, on chronic Coumadin therapy secondary to prior DVT admitted with nausea, orthopnea, early satiety and shortness of breath with minimal activity and diagnosed with acute on chronic systolic stage IV heart failure unresponsive to home diuretic therapy.   Assessment & Plan    1.  Acute on chronic systolic CHF NYHA Class IV.  He was started on IV Lasix 58m TID with 900cc out overnight. Weight down 5lbs from admit.  His dry weight is 185lbs and admit weight was 208lbs.  Now 203lbs today.   Optivol on AICD has indicated recent increase in fluid volume despite patient trying to limit his fluid intake and following a low sodium diet.   --BMET pending this am.  May need to add metolazone or increase lasix.  If renal function stable will increase Lasix to 1268mIV TID.  -- He will continue on Aldactone, nitrates, hydralazine, dig and BB.   -- May need to back down on BB if he does not diurese better.   -- He was on ACE I prior to d/c and this was stopped in ER when seen in 08/2016 due to creatinine elevated at 1.34.  I am going to start him on low dose losartan 2521maily which [facility] be [facility]. Will follow renal function closely.   2.  Hypertensive heart disease with CHF -- BP well controlled on above meds.  3.  Ischemic DCM with EF 30% by echo 07/2016 - s/p AICD with recent generator changeout. Followed by Dr. KleCaryl Comes4.  ASCAD with prior anterior infarct in 2005 s/p PCI of LAD/diag. -- continue Imdur/BB -- Restart atorvastatin.  -- not on ASA due to warfarin  5.  Hyperlipidemia with LDL goal < 70 -- Continue high dose statin -- Check FLp and ALT in am  6.  Chronic anticoagulation for prior DVT. -- He says that he ran out of his coumadin and his pharmacy would not refill it because he had not followed up -- Coumadin restarted and being followed by Pharmacy  I have spent a total of 35 minutes with patient reviewing prior notes in Epic, echo 07/2016 , telemetry, EKGs, labs and examining patient as well as establishing an assessment and plan that was discussed with the patient.  > 50% of time was spent in direct patient care.    Signed, TraFransico HimD  11/19/2016, 10:49 AM

## 2016-11-19 NOTE — Progress Notes (Signed)
[place] for Warfarin Indication: DVT history  Allergies  Allergen Reactions  . Metformin And Related Diarrhea    Patient Measurements: Height: 5' 8"  (172.7 cm) Weight: 203 lb 1.6 oz (92.1 kg) IBW/kg (Calculated) : 68.4  Vital Signs: Temp: 97.4 F (36.3 C) (03/17 0534) Temp Source: Oral (03/17 0534) BP: 108/77 (03/17 1052) Pulse Rate: 80 (03/17 1038)  Labs:  Recent Labs  11/18/16 1830 11/19/16 0517  HGB 11.6*  --   HCT 36.5*  --   PLT 246  --   LABPROT 15.4* 16.2*  INR 1.22 1.30  CREATININE 1.35*  --     Estimated Creatinine Clearance: 70.5 mL/min (A) (by C-G formula based on SCr of 1.35 mg/dL (H)).   Medical History: Past Medical History:  Diagnosis Date  . Angina   . ASCVD (arteriosclerotic cardiovascular disease)    , Anterior infarction 2005, LAD diagonal bifurcation intervention 03/2004  . Automatic implantable cardiac defibrillator -[employer]       . Benign neoplasm of colon   . Chronic systolic heart failure ([place])   . Coronary artery disease     Widely patent previously placed stents in the left anterior   . Crohn's disease ([place])   . 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 Gastroparesis   . GERD (gastroesophageal reflux disease)   . High cholesterol   . Hyperlipidemia   . Hypersomnolent    Previous diagnosis of narcolepsy  . Hypertension, essential   . Ischemic cardiomyopathy    Ejection fraction 15-20% catheterization 2010  . Type II or unspecified type diabetes mellitus without mention of complication, not stated as uncontrolled   . 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 Pharmacy consulted to continue home warfarin, noted patient ran out of medication 3 weeks ago.  INR on admission = 1.22, last documented outpatient visit 08/10/16 with dose of 5 mg daily except 7.5 mg on Fridays. INR up  to 1.3 this AM after 1 dose of 66m last night. Hg 11.6, plt wnl. No bleed documented.  Goal of Therapy:  INR 2-3 Monitor platelets by anticoagulation protocol: Yes   Plan:  Warfarin 10 mg po x 1 dose tonight Daily INR Monitor CBC, s/sx bleeding   HElicia Lamp PharmD, BCPS Clinical Pharmacist 11/19/2016 10:56 AM

## 2016-11-20 DIAGNOSIS — N179 Acute kidney failure, unspecified: Secondary | ICD-10-CM

## 2016-11-20 DIAGNOSIS — I5023 Acute on chronic systolic (congestive) heart failure: Secondary | ICD-10-CM

## 2016-11-20 DIAGNOSIS — I214 Non-ST elevation (NSTEMI) myocardial infarction: Secondary | ICD-10-CM

## 2016-11-20 LAB — COOXEMETRY PANEL
Carboxyhemoglobin: 0.6 % (ref 0.5–1.5)
Carboxyhemoglobin: 1 % (ref 0.5–1.5)
METHEMOGLOBIN: 0.9 % (ref 0.0–1.5)
Methemoglobin: 1.2 % (ref 0.0–1.5)
O2 SAT: 54 %
O2 Saturation: 36.5 %
Total hemoglobin: 10 g/dL — ABNORMAL LOW (ref 12.0–16.0)
Total hemoglobin: 9.3 g/dL — ABNORMAL LOW (ref 12.0–16.0)

## 2016-11-20 LAB — LIPID PANEL
Cholesterol: 160 mg/dL (ref 0–200)
HDL: 39 mg/dL — ABNORMAL LOW (ref >40)
LDL Cholesterol: 99 mg/dL (ref 0–99)
Total CHOL/HDL Ratio: 4.1 ratio
Triglycerides: 111 mg/dL (ref <150)
VLDL: 22 mg/dL (ref 0–40)

## 2016-11-20 LAB — PROTIME-INR
INR: 1.43
Prothrombin Time: 17.6 s — ABNORMAL HIGH (ref 11.4–15.2)

## 2016-11-20 LAB — MAGNESIUM: MAGNESIUM: 1.6 mg/dL — AB (ref 1.7–2.4)

## 2016-11-20 LAB — GLUCOSE, CAPILLARY
GLUCOSE-CAPILLARY: 102 mg/dL — AB (ref 65–99)
Glucose-Capillary: 228 mg/dL — ABNORMAL HIGH (ref 65–99)
Glucose-Capillary: 216 mg/dL — ABNORMAL HIGH (ref 65–99)
Glucose-Capillary: 241 mg/dL — ABNORMAL HIGH (ref 65–99)
Glucose-Capillary: 197 mg/dL — ABNORMAL HIGH (ref 65–99)

## 2016-11-20 LAB — TROPONIN I
Troponin I: 5.11 ng/mL (ref <0.03)
Troponin I: 7.85 ng/mL (ref <0.03)

## 2016-11-20 LAB — BASIC METABOLIC PANEL WITH GFR
Anion gap: 10 (ref 5–15)
BUN: 51 mg/dL — ABNORMAL HIGH (ref 6–20)
CO2: 23 mmol/L (ref 22–32)
Calcium: 8.2 mg/dL — ABNORMAL LOW (ref 8.9–10.3)
Chloride: 100 mmol/L — ABNORMAL LOW (ref 101–111)
Creatinine, Ser: 2.63 mg/dL — ABNORMAL HIGH (ref 0.61–1.24)
GFR calc Af Amer: 30 mL/min — ABNORMAL LOW (ref >60)
GFR calc non Af Amer: 26 mL/min — ABNORMAL LOW (ref >60)
Glucose, Bld: 244 mg/dL — ABNORMAL HIGH (ref 65–99)
Potassium: 4.9 mmol/L (ref 3.5–5.1)
Sodium: 133 mmol/L — ABNORMAL LOW (ref 135–145)

## 2016-11-20 LAB — MRSA PCR SCREENING: MRSA by PCR: NEGATIVE

## 2016-11-20 LAB — HEPARIN LEVEL (UNFRACTIONATED): Heparin Unfractionated: <0.1 [IU]/mL — ABNORMAL LOW (ref 0.30–0.70)

## 2016-11-20 MED ORDER — MILRINONE LACTATE IN DEXTROSE 20-5 MG/100ML-% IV SOLN
0.3750 ug/kg/min | INTRAVENOUS | Status: DC
  Administered 2016-11-20: 0.25 ug/kg/min via INTRAVENOUS
  Administered 2016-11-21 – 2016-11-24 (×9): 0.375 ug/kg/min via INTRAVENOUS
  Filled 2016-11-20 (×11): qty 100

## 2016-11-20 MED ORDER — WARFARIN SODIUM 7.5 MG PO TABS: 7.5000 mg | ORAL_TABLET | Freq: Once | ORAL | Status: DC

## 2016-11-20 MED ORDER — DEXTROSE 5 % IV SOLN
120.0000 mg | Freq: Three times a day (TID) | INTRAVENOUS | Status: DC
  Filled 2016-11-20 (×2): qty 12

## 2016-11-20 MED ORDER — HYDRALAZINE HCL 10 MG PO TABS: 10.0000 mg | ORAL_TABLET | Freq: Three times a day (TID) | ORAL | Status: DC

## 2016-11-20 MED ORDER — CARVEDILOL 12.5 MG PO TABS: 12.5000 mg | ORAL_TABLET | Freq: Two times a day (BID) | ORAL | Status: DC

## 2016-11-20 MED ORDER — MAGNESIUM SULFATE IN D5W 1-5 GM/100ML-% IV SOLN
1.0000 g | Freq: Once | INTRAVENOUS | Status: AC
  Administered 2016-11-20: 1 g via INTRAVENOUS
  Filled 2016-11-20: qty 100

## 2016-11-20 MED ORDER — HEPARIN SODIUM (PORCINE) 5000 UNIT/ML IJ SOLN: 5000.0000 [IU] | Freq: Three times a day (TID) | INTRAMUSCULAR | Status: DC

## 2016-11-20 MED ORDER — DEXTROSE 5 % IV SOLN
3.0000 ug/min | INTRAVENOUS | Status: DC
  Administered 2016-11-20 – 2016-11-22 (×3): 3 ug/min via INTRAVENOUS
  Filled 2016-11-20 (×3): qty 4

## 2016-11-20 MED ORDER — SODIUM CHLORIDE 0.9% FLUSH
10.0000 mL | Freq: Two times a day (BID) | INTRAVENOUS | Status: DC
  Administered 2016-11-20 – 2016-11-23 (×4): 10 mL

## 2016-11-20 MED ORDER — FUROSEMIDE 10 MG/ML IJ SOLN
120.0000 mg | Freq: Once | INTRAVENOUS | Status: AC
  Administered 2016-11-21: 120 mg via INTRAVENOUS
  Filled 2016-11-20: qty 12

## 2016-11-20 MED ORDER — SODIUM CHLORIDE 0.9% FLUSH: 10.0000 mL | INTRAVENOUS | Status: DC | PRN

## 2016-11-20 MED ORDER — ATORVASTATIN CALCIUM 80 MG PO TABS
80.0000 mg | ORAL_TABLET | Freq: Every day | ORAL | Status: DC
  Administered 2016-11-20 – 2016-11-27 (×8): 80 mg via ORAL
  Filled 2016-11-20 (×8): qty 1

## 2016-11-20 MED ORDER — ASPIRIN 81 MG PO CHEW
81.0000 mg | CHEWABLE_TABLET | Freq: Every day | ORAL | Status: DC
  Administered 2016-11-20 – 2016-11-24 (×4): 81 mg via ORAL
  Filled 2016-11-20 (×6): qty 1

## 2016-11-20 MED ORDER — HEPARIN (PORCINE) IN NACL 100-0.45 UNIT/ML-% IJ SOLN
1500.0000 [IU]/h | INTRAMUSCULAR | Status: DC
  Administered 2016-11-20: 1200 [IU]/h via INTRAVENOUS
  Administered 2016-11-21 – 2016-11-23 (×4): 1500 [IU]/h via INTRAVENOUS
  Filled 2016-11-20 (×4): qty 250

## 2016-11-20 NOTE — Progress Notes (Signed)
[place] for Heparin Indication: DVT history  Allergies  Allergen Reactions  . Metformin And Related Diarrhea    Patient Measurements: Height: 5' 8"  (172.7 cm) Weight: 205 lb 14.6 oz (93.4 kg) IBW/kg (Calculated) : 68.4  HDW: 88.2 kg  Vital Signs: Temp: 97.7 F (36.5 C) (03/18 1700) Temp Source: Oral (03/18 1700) BP: 86/66 (03/18 1700) Pulse Rate: 71 (03/18 1700)  Labs:  Recent Labs  11/18/16 1830 11/19/16 0517 11/19/16 1203 11/20/16 0215 11/20/16 1125 11/20/16 1322 11/20/16 1815  HGB 11.6*  --   --   --   --   --   --   HCT 36.5*  --   --   --   --   --   --   PLT 246  --   --   --   --   --   --   LABPROT 15.4* 16.2*  --  17.6*  --   --   --   INR 1.22 1.30  --  1.43  --   --   --   HEPARINUNFRC  --   --   --   --   --   --  <0.10*  CREATININE 1.35*  --  1.65*  --   --  2.63*  --   TROPONINI  --   --   --   --  5.11*  --   --     Estimated Creatinine Clearance: 36.4 mL/min (A) (by C-G formula based on SCr of 2.63 mg/dL (H)).   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 Noted possible plans for cath on 3/19 pending INR per cards. Warfarin held and pharmacy consulted to bridge with heparin while INR<2.    Heparin level this evening is SUBtherapeutic however was drawn 1 hour early (HL<0.1, goal of 0.3-0.7).   Goal of Therapy: Heparin level 0.3-0.7 units/ml Monitor platelets by anticoagulation protocol: Yes  Plan 1. Increase heparin to 1500 units/hr (15 ml/hr) 2. Will continue to monitor for any signs/symptoms of bleeding and will follow up with heparin level in 6 hours   Thank you for allowing pharmacy to be a part of this patient's care.  Alycia Rossetti, PharmD, BCPS Clinical Pharmacist Pager: [phone] 11/20/2016 6:59 PM

## 2016-11-20 NOTE — Consult Note (Signed)
ADVANCED CHF CONSULT NOTE  Patient ID: [name] MRN: [id number] DOB/AGE: 09-24-1965 50 y.o.  Admit date: 11/18/2016 Referring Physician: Radford Pax Reason for Consultation: CHF, concern for low output.   HPI: 51 yo with history of CAD s/p anterior MI in 2010 and chronic systolic CHF (presumed to be ischemic cardiomyopathy) was admitted with about 10 days of increased dyspnea and nausea.  Patient had been stable until that time, takes Lasix 80 mg po bid.  Noted that weight was rising and he was developing lower extremity edema that was spreading up to his abdomen.  He tried increasing Lasix to 80 mg tid but this did not help.  Dyspnea became progressive and was present with minimal exertion by the end of last week.  He had orthopnea.  No chest pain.  He saw Dr Marlou Porch and was admitted on Friday from the office.  He was started on IV Lasix but diuresed poorly.  Lasix was increased today to 120 mg IV every 8 hrs.  SBP was low since last night, running 80s-90s, currently in the 80s.  Yesterday evening, he had chest discomfort/burning that lasted for about 1 hour.  This resolved spontaneously.  Today, troponin was checked and was 5.11.  Today, he feels fatigued.  Not short of breath at rest.  Denies lightheadedness.  Still orthopneic.  BMET this afternoon shows increase in creatinine to 2.63.  UOP has been poor.   Review of systems complete and found to be negative unless listed above in HPI  Past Medical History: 1. Chronic systolic CHF: Suspect ischemic cardiomyopathy.  St Jude ICD.  - Echo (11/17) with EF 25-30%, moderate diastolic dysfunction, mild MR, mildly dilated RV with moderately decreased systolic function, peak RV-RA gradient 48 mmHg.  2. CAD: Anterior MI 2005, had bifurcation stenting LAD/diagonal.  Repeat cath 2010 with patent stents.  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  4. Crohns disease: History of colectomy.  5. GERD 6. Gastroparesis 7. HTN 8. Hyperlipidemia 9. Type II  diabetes   Family History  Problem Relation Age of Onset  . Heart attack Father   . Stroke Paternal Grandfather     Social History   Social History  . Marital status: Married    Spouse name: N/A  . Number of children: N/A  . Years of education: N/A   Occupational History  . Not on file.   Social History Main Topics  . Smoking status: Never Smoker  . Smokeless tobacco: Never Used  . Alcohol use No  . Drug use: No  . Sexual activity: No   Other Topics Concern  . Not on file   Social History Narrative  . No narrative on file     Prescriptions Prior to Admission  Medication Sig Dispense Refill Last Dose  . aspirin 81 MG tablet Take 81 mg by mouth daily.   Past Week at Unknown time  . carvedilol (COREG) 25 MG tablet Take 1 tablet (25 mg total) by mouth 2 (two) times daily with a meal. 60 tablet 11 Past Week at Unknown time  . digoxin (LANOXIN) 0.125 MG tablet Take 1 tablet (0.125 mg total) by mouth daily. 30 tablet 11 11/17/2016 at Unknown time  . furosemide (LASIX) 80 MG tablet Take 80 mg by mouth 3 (three) times daily.   11/18/2016 at Unknown time  . hydrALAZINE (APRESOLINE) 25 MG tablet Take 1 tablet (25 mg total) by mouth 3 (three) times daily. 90 tablet 11 11/17/2016 at Unknown time  . insulin  glargine (LANTUS) 100 UNIT/ML injection Inject 0.3 mLs (30 Units total) into the skin every morning. Sliding scale. 10 mL 11 11/18/2016 at Unknown time  . isosorbide mononitrate (IMDUR) 60 MG 24 hr tablet Take 1.5 tablets (90 mg total) by mouth daily. 45 tablet 11 11/17/2016 at Unknown time  . nitroGLYCERIN (NITROSTAT) 0.4 MG SL tablet Place 1 tablet (0.4 mg total) under the tongue every 5 (five) minutes as needed for chest pain. 25 tablet 3 prn  . ondansetron (ZOFRAN) 4 MG tablet Take 1 tablet (4 mg total) by mouth every 8 (eight) hours as needed for nausea or vomiting. 20 tablet 0 Past Week at Unknown time  . potassium chloride (K-DUR) 10 MEQ tablet Take 2 tablets (20 mEq total) by mouth  daily. 60 tablet 11 Past Week at Unknown time  . spironolactone (ALDACTONE) 25 MG tablet Take 1 tablet (25 mg total) by mouth daily. 30 tablet 11 Past Week at Unknown time  . warfarin (COUMADIN) 5 MG tablet Take as directed by Coumadin Clinic 35 tablet 0 3 weeks   Current Scheduled Meds: . aspirin  81 mg Oral Daily  . atorvastatin  80 mg Oral q1800  . digoxin  0.125 mg Oral Daily  . furosemide  120 mg Intravenous TID  . insulin aspart  0-15 Units Subcutaneous TID WC  . insulin aspart  0-5 Units Subcutaneous QHS  . insulin glargine  30 Units Subcutaneous q morning - 10a  . mouth rinse  15 mL Mouth Rinse BID  . sodium chloride flush  3 mL Intravenous Q12H   Continuous Infusions: . heparin 1,200 Units/hr (11/20/16 1320)  . norepinephrine (LEVOPHED) Adult infusion     PRN Meds:.sodium chloride, acetaminophen, ALPRAZolam, alum & mag hydroxide-simeth, ondansetron (ZOFRAN) IV, sodium chloride flush, zolpidem   Physical exam Blood pressure (!) 83/43, pulse 73, temperature 98.1 F (36.7 C), temperature source Oral, resp. rate 20, height 5' 8"  (1.727 m), weight 204 lb (92.5 kg), SpO2 99 %. General: NAD Neck: JVP 16 cm, no thyromegaly or thyroid nodule.  Lungs: Mild crackles at bases bilaterally.  CV: Lateral PMI.  Heart regular S1/S2, no S3/S4, no murmur.  1+ edema to knees bilaterally.  No carotid bruit.  Normal pedal pulses.  Abdomen: Soft, nontender, no hepatosplenomegaly, mild distention.  Skin: Intact without lesions or rashes.  Neurologic: Alert and oriented x 3.  Psych: Normal affect. Extremities: No clubbing or cyanosis.  HEENT: Normal.   Labs:   Lab Results  Component Value Date   WBC 6.6 11/18/2016   HGB 11.6 (L) 11/18/2016   HCT 36.5 (L) 11/18/2016   MCV 78.3 11/18/2016   PLT 246 11/18/2016    Recent Labs Lab 11/18/16 1830  11/20/16 1322  NA 138  < > 133*  K 4.2  < > 4.9  CL 104  < > 100*  CO2 23  < > 23  BUN 29*  < > 51*  CREATININE 1.35*  < > 2.63*  CALCIUM  8.6*  < > 8.2*  PROT 7.6  --   --   BILITOT 0.7  --   --   ALKPHOS 157*  --   --   ALT 27  --   --   AST 60*  --   --   GLUCOSE 211*  < > 244*  < > = values in this interval not displayed. Lab Results  Component Value Date   CKTOTAL 134 11/18/2011   CKMB 2.7 11/18/2011   TROPONINI 5.11 (HH) 11/20/2016  EKG (personally reviewed): NSR, lateral shallow T wave inversion (progressive from prior ECG in 12/17).   ASSESSMENT AND PLAN: 51 yo with history of CAD s/p anterior MI in 2010 and chronic systolic CHF (presumed to be ischemic cardiomyopathy) was admitted with about 10 days of increased dyspnea and nausea.  1. Acute on chronic systolic CHF: Echo 48/01 with EF 25-30%, ischemic cardiomyopathy.  On exam, he is markedly volume overloaded.  Unfortunately, he has diuresed poorly.  BP is low (SBP in 80s currently) and creatinine up to 2.63.  I am concerned for low output HF.  - Placing PICC now, will check co-ox and will follow CVPs (moving to step-down).   - Hold further Lasix for now until BP and cardiac output are optimized.  Will start with norepinephrine at low dose to get BP up.  If co-ox is low, may add milrinone to norepinephrine.  - When hemodynamics are stabilized, he will need extensive diuresis.  - Will need eventual RHC.  2. AKI: Suspect cardiorenal syndrome with low output and hypotension as cause.  Need to stabilize hemodynamics to improve. Creatinine 2.63 this afternoon.  3. CAD: Concern for NSTEMI.  ECG is somewhat different with increased T wave inversions in lateral leads compared to prior ECGs.  He had chest pain last night.  It is possible that this is demand ischemia in setting of hypotension and volume overload but he merits eventual cardiac cath.  - Cover with heparin gtt while INR < 2.  I am concerned that INR will be higher tomorrow as he got 10 mg coumadin last night.  - Will give ASA and high dose statin.  - Coronary angiography when creatinine stabilizes and INR is <  1.8.  4. h/o DVTs: Coumadin held for possible cath, will be on heparin gtt while INR < 2.  5. DM2: covering with sliding scale.   Signed: Darrik Richman 11/20/2016 3:15 PM

## 2016-11-20 NOTE — Progress Notes (Signed)
Notified MD of troponin level 5.11 and 5 beat run of Vtach. New orders placed. Will continue to monitor. Isac Caddy, RN

## 2016-11-20 NOTE — Progress Notes (Signed)
Patient dropped BP today after am meds.  His SBP in now in the 80's. Transfer to CCU.  Will get PICC line to get a Co ox as well as CVP.  Stop BB/hydralazine and nitrates due to hypotension. Will start Levo gtt for BP support.  Trop ordered this am is 5 c/w NSTEMI.  He has not had any further CP.  He is now on IV Heparin per pharmacy.  Start ASA 73m daily (not on in past due to coumadin). Continue high dose statin.  Coumadin stopped in anticipation of cath but he got 10 of coumadin last PM and his INR is 1.45 today so may be too high for cath in am.  Will reassess in am as he is unstable at present.  This was discussed with AHF team who will see patient today.

## 2016-11-20 NOTE — Progress Notes (Signed)
Pt reports pain is now 1/10 and some improvement in nausea.

## 2016-11-20 NOTE — Progress Notes (Signed)
eLink Physician-Brief Progress Note Patient Name: [name] DOB: November 19, 1965 MRN: [id number]   Date of Service  11/20/2016  HPI/Events of Note  Ischemic cardiomyopathy, acute decompensation, advanced heart failure consulted for inotrope support, stable on camera check  eICU Interventions  No eICU intervention necessary     Intervention Category Evaluation Type: New Patient Evaluation  Simonne Maffucci 11/20/2016, 5:15 PM

## 2016-11-20 NOTE — Progress Notes (Signed)
Patient concerned of pain at PICC insertion site which was inserted today. No redness, swelling, or ecchymosis at or around site. Fluids infusing. Explained to patient that the site may be sore for a couple of days. No questions or further concerns at this time.

## 2016-11-20 NOTE — Progress Notes (Signed)
Patient reports chest discomfort rated 4/10 and mild nausea. BP 96/62, pulse 75. EKG done no acute changes. Ordered Maalox from cardiology prn orders. Pt given Maalox, Zofran IV, and Tylenol per PRN orders.

## 2016-11-20 NOTE — Progress Notes (Signed)
Per MD order Pt transferred to 4N ICU. Report given to receiving Nurse. CCMD notified. Alysia Penna

## 2016-11-20 NOTE — Progress Notes (Addendum)
Patient Name: [name] Date of Encounter: 11/20/2016  Primary Cardiologist: Dr. Dione Housekeeper Problem List     Active Problems:   Dyslipidemia, goal LDL below 70   Coronary artery disease   Ischemic cardiomyopathy   Essential hypertension   Acute on chronic systolic CHF (congestive heart failure), NYHA class 4 (HCC)     Subjective   Feels better this am.  Has put out 600cc yesterday and net neg 1.5L. SOB improved but having some nausea.  Had some chest discomfort yesterday and given maalox, zofran and tylenol with improvement in CP and nausea.  He says the CP was very uncomfortable and very similar to what he has had with his CAD in the past  Inpatient Medications    Scheduled Meds: . carvedilol  25 mg Oral BID WC  . digoxin  0.125 mg Oral Daily  . furosemide  80 mg Intravenous TID  . hydrALAZINE  25 mg Oral TID  . insulin aspart  0-15 Units Subcutaneous TID WC  . insulin aspart  0-5 Units Subcutaneous QHS  . insulin glargine  30 Units Subcutaneous q morning - 10a  . isosorbide mononitrate  90 mg Oral Daily  . losartan  12.5 mg Oral Daily  . mouth rinse  15 mL Mouth Rinse BID  . potassium chloride SA  20 mEq Oral Daily  . sodium chloride flush  3 mL Intravenous Q12H  . spironolactone  25 mg Oral Daily  . Warfarin - Pharmacist Dosing Inpatient   Does not apply q1800   Continuous Infusions:  PRN Meds: sodium chloride, acetaminophen, ALPRAZolam, alum & mag hydroxide-simeth, ondansetron (ZOFRAN) IV, sodium chloride flush, zolpidem   Vital Signs    Vitals:   11/19/16 2055 11/19/16 2335 11/20/16 0515 11/20/16 1040  BP: 101/63 96/62 98/66    Pulse: 75 75 70 75  Resp: 18 18 18    Temp: 98.1 F (36.7 C)  97.8 F (36.6 C)   TempSrc: Oral  Oral   SpO2: 96% 99% 98%   Weight:   204 lb (92.5 kg)   Height:        Intake/Output Summary (Last 24 hours) at 11/20/16 1044 Last data filed at 11/19/16 1612  Gross per 24 hour  Intake                0 ml  Output               600 ml  Net             -600 ml   Filed Weights   11/18/16 1619 11/19/16 0600 11/20/16 0515  Weight: 208 lb (94.3 kg) 203 lb 1.6 oz (92.1 kg) 204 lb (92.5 kg)    Physical Exam    GEN: Well nourished, well developed, in no acute distress.  HEENT: Grossly normal.  Neck: Supple, no JVD, carotid bruits, or masses. Cardiac: RRR, no murmurs, rubs, or gallops. No clubbing, cyanosis  Marked LE edema.  Radials/DP/PT 2+ and equal bilaterally.  Respiratory:  Respirations regular and unlabored, clear to auscultation bilaterally. GI: Soft, nontender, nondistended, BS + x 4. MS: 2+ pitting edema of his feet and legs Skin: warm and dry, no rash. Neuro:  Strength and sensation are intact. Psych: AAOx3.  Normal affect.  Labs    CBC  Recent Labs  11/18/16 1830  WBC 6.6  NEUTROABS 4.8  HGB 11.6*  HCT 36.5*  MCV 78.3  PLT 267   Basic Metabolic Panel  Recent Labs  11/18/16 1830 11/19/16 1203  NA 138 131*  K 4.2 4.8  CL 104 101  CO2 23 22  GLUCOSE 211* 149*  BUN 29* 38*  CREATININE 1.35* 1.65*  CALCIUM 8.6* 8.1*   Liver Function Tests  Recent Labs  11/18/16 1830  AST 60*  ALT 27  ALKPHOS 157*  BILITOT 0.7  PROT 7.6  ALBUMIN 3.1*   No results for input(s): LIPASE, AMYLASE in the last 72 hours. Cardiac Enzymes No results for input(s): CKTOTAL, CKMB, CKMBINDEX, TROPONINI in the last 72 hours. BNP Invalid input(s): POCBNP D-Dimer No results for input(s): DDIMER in the last 72 hours. Hemoglobin A1C No results for input(s): HGBA1C in the last 72 hours. Fasting Lipid Panel No results for input(s): CHOL, HDL, LDLCALC, TRIG, CHOLHDL, LDLDIRECT in the last 72 hours. Thyroid Function Tests  Recent Labs  11/18/16 1830  TSH 2.149    Telemetry    NSR - Personally Reviewed  ECG    NSR - Personally Reviewed  Radiology    No results found.  Cardiac Studies   none  Patient Profile     [name] a 51 y.o.malehere with chronic  systolic heart failure, diabetes, hypertension, ischemic cardiomyopathy ejection fraction 30%, ICD, on chronic Coumadin therapy secondary to prior DVT admitted with nausea, orthopnea, early satiety and shortness of breath with minimal activity and diagnosed with acute on chronic systolic stage IV heart failure unresponsive to home diuretic therapy.   Assessment & Plan    1.  Acute on chronic systolic CHF NYHA Class IV.  He was started on IV Lasix 69m TID with 900cc out overnight. Weight down 5lbs from admit. His dry weight is 185lbs and admit weight was 208lbs.  Now 204lbs today up 1lb from yesterday.   Optivol on AICD has indicated recent increase in fluid volume despite patient trying to limit his fluid intake and following a low sodium diet.   -- continues to have nausea that is improved with zofran and likely from bowel wall edema and hepatic congestion. I am concerned, though, because he had CP last night with more prominent T wave changes in the anterior precordial leads worse from prior EKG and his AST was up yesterday with normal ALT.  AST can be elevated in NSTEMI and his CP was very similar to his prior CAD.  I think we need to consider right and left heart cath to make sure some of his worsening CHF is not due to coronary ischemia.  -- BMET this am with slightly worsening renal function (creatinine 1.35>>1.65) ? If this is due to addition of low dose ARB or diuresis.  BNP was 2,048 2 days ago and still markedly edematous on exam. -- Will increase Lasix to 1282mIV TID.  -- He will continue on Aldactone, nitrates, hydralazine, dig and BB.   -- May need to back down on BB if he does not diurese better.   -- He was on ACE I prior to d/c and this was stopped in ER when seen in 08/2016 due to creatinine elevated at 1.34.  Added low dose losartan 2588maily yesterday which will be easier to change to [facility] he tolerates the ARB. Creatinine up slightly to 1.65.  Will hold for now as we are  contemplating cath. -- Will ask AHF team to see. -- not a candidate for upgrade of device to BiV as QRS is narrow. -- hold potassium as K is 4.8 today and he is on aldactone.  Will  check BMET in am  2.  Hypertensive heart disease with CHF -- BP well controlled on above meds.  3.  Ischemic DCM with EF 30% by echo 07/2016 - s/p AICD with recent generator changeout. Followed by Dr. Caryl Comes.  4.  ASCAD with prior anterior infarct in 2005 s/p PCI of LAD/diag. -- he had CP overnight and AST is elevated with normal ALT.  I will check a trop today.  EKG last night showed more marked T wave changes than EKG 08/2016 and CP very similar to his prior angina so need to consider that ischemia could be playing a role in worsening CHF.  Will likely need right and left heart cath but timing dependent on INR tomorrow -- continue Imdur/BB -- Restarted atorvastatin.  -- not on ASA due to warfarin -- will hold warfarin for now and place on IV heparin gtt per pharmacy -- timing of cath dependent on what INR is in am -- will make NPO for now. -- cycle trop  5.  Hyperlipidemia with LDL goal < 70 -- Continue high dose statin -- Check FLp and ALT in am  6.  Chronic anticoagulation for prior DVT. -- He says that he ran out of his coumadin and his pharmacy would not refill it because he had not followed up -- Coumadin restarted and being followed by Pharmacy.  INR today 1.43. -- will stop coumadin in anticipation of cath.  Start IV heparin per pharmacy.   Signed, Fransico Him, MD  11/20/2016, 10:44 AM

## 2016-11-20 NOTE — Progress Notes (Addendum)
[facility] for Warfarin > heparin Indication: DVT history  Allergies  Allergen Reactions  . Metformin And Related Diarrhea    Patient Measurements: Height: 5' 8"  (172.7 cm) Weight: 204 lb (92.5 kg) IBW/kg (Calculated) : 68.4  HDW: 88.2 kg  Vital Signs: Temp: 97.8 F (36.6 C) (03/18 0515) Temp Source: Oral (03/18 0515) BP: 98/66 (03/18 0515) Pulse Rate: 75 (03/18 1040)  Labs:  Recent Labs  11/18/16 1830 11/19/16 0517 11/19/16 1203 11/20/16 0215  HGB 11.6*  --   --   --   HCT 36.5*  --   --   --   PLT 246  --   --   --   LABPROT 15.4* 16.2*  --  17.6*  INR 1.22 1.30  --  1.43  CREATININE 1.35*  --  1.65*  --     Estimated Creatinine Clearance: 57.8 mL/min (A) (by C-G formula based on SCr of 1.65 mg/dL (H)).   Medical History: Past Medical History:  Diagnosis Date  . Angina   . ASCVD (arteriosclerotic cardiovascular disease)    , Anterior infarction 2005, LAD diagonal bifurcation intervention 03/2004  . Automatic implantable cardiac defibrillator -[employer]       . Benign neoplasm of colon   . Chronic systolic heart failure ([place])   . Coronary artery disease     Widely patent previously placed stents in the left anterior   . Crohn's disease ([place])   . 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 Gastroparesis   . GERD (gastroesophageal reflux disease)   . High cholesterol   . Hyperlipidemia   . Hypersomnolent    Previous diagnosis of narcolepsy  . Hypertension, essential   . Ischemic cardiomyopathy    Ejection fraction 15-20% catheterization 2010  . Type II or unspecified type diabetes mellitus without mention of complication, not stated as uncontrolled   . 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 Pharmacy consulted to continue home warfarin and add heparin SQ for DVT prophylaxis on 3/18, noted patient ran  out of medication 3 weeks ago.  INR on admission = 1.22, last documented outpatient visit 08/10/16 with dose of 5 mg daily except 7.5 mg on Fridays. INR up to 1.43 this AM after 2 doses of 62m resumed. Hg 11.6, plt wnl. No bleed documented.  Goal of Therapy:  INR 2-3 Monitor platelets by anticoagulation protocol: Yes   Plan:  Warfarin 7.5 mg po x 1 dose tonight Heparin 5000 units [place] q8h - d/c when INR therapeutic >24h Daily INR Monitor CBC, s/sx bleeding F/u Cards plans - per notes, may consider cath?   HElicia Lamp PharmD, BCPS Clinical Pharmacist 11/20/2016 10:58 AM   ADDENDUM:  Pt likely to have cath 3/19 pending INR per Cards. Pharmacy consulted to transition from warfarin to heparin IV (no doses of heparin SQ were given). INR 1.43 this AM.   Plan: Hold warfarin, d/c heparin SQ Start heparin IV at 1200 units/h 6h heparin level Daily heparin level/CBC Monitor for s/sx bleeding Cath for 3/19 if INR ok per Cards notes   HElicia Lamp PharmD, BCPS Clinical Pharmacist 11/20/2016 11:40 AM

## 2016-11-21 ENCOUNTER — Other Ambulatory Visit (HOSPITAL_COMMUNITY): Payer: BLUE CROSS/BLUE SHIELD

## 2016-11-21 ENCOUNTER — Telehealth: Payer: Self-pay | Admitting: Internal Medicine

## 2016-11-21 LAB — HEPARIN LEVEL (UNFRACTIONATED)
HEPARIN UNFRACTIONATED: 0.41 [IU]/mL (ref 0.30–0.70)
HEPARIN UNFRACTIONATED: 0.63 [IU]/mL (ref 0.30–0.70)

## 2016-11-21 LAB — CBC
HEMATOCRIT: 30 % — AB (ref 39.0–52.0)
Hemoglobin: 9.4 g/dL — ABNORMAL LOW (ref 13.0–17.0)
MCH: 24.2 pg — AB (ref 26.0–34.0)
MCHC: 31.3 g/dL (ref 30.0–36.0)
MCV: 77.3 fL — AB (ref 78.0–100.0)
Platelets: 250 10*3/uL (ref 150–400)
RBC: 3.88 MIL/uL — ABNORMAL LOW (ref 4.22–5.81)
RDW: 16.6 % — AB (ref 11.5–15.5)
WBC: 8.5 10*3/uL (ref 4.0–10.5)

## 2016-11-21 LAB — BASIC METABOLIC PANEL
Anion gap: 9 (ref 5–15)
BUN: 53 mg/dL — AB (ref 6–20)
CALCIUM: 8.1 mg/dL — AB (ref 8.9–10.3)
CHLORIDE: 97 mmol/L — AB (ref 101–111)
CO2: 27 mmol/L (ref 22–32)
CREATININE: 2.69 mg/dL — AB (ref 0.61–1.24)
GFR calc non Af Amer: 26 mL/min — ABNORMAL LOW (ref >60)
GFR, EST AFRICAN AMERICAN: 30 mL/min — AB (ref >60)
GLUCOSE: 221 mg/dL — AB (ref 65–99)
Potassium: 4.3 mmol/L (ref 3.5–5.1)
Sodium: 133 mmol/L — ABNORMAL LOW (ref 135–145)

## 2016-11-21 LAB — COOXEMETRY PANEL
Carboxyhemoglobin: 1.1 % (ref 0.5–1.5)
Carboxyhemoglobin: 1.7 % — ABNORMAL HIGH (ref 0.5–1.5)
METHEMOGLOBIN: 1.2 % (ref 0.0–1.5)
Methemoglobin: 0.8 % (ref 0.0–1.5)
O2 Saturation: 55.4 %
O2 Saturation: 61.5 %
TOTAL HEMOGLOBIN: 9.8 g/dL — AB (ref 12.0–16.0)
Total hemoglobin: 9.2 g/dL — ABNORMAL LOW (ref 12.0–16.0)

## 2016-11-21 LAB — GLUCOSE, CAPILLARY
GLUCOSE-CAPILLARY: 251 mg/dL — AB (ref 65–99)
Glucose-Capillary: 115 mg/dL — ABNORMAL HIGH (ref 65–99)
Glucose-Capillary: 181 mg/dL — ABNORMAL HIGH (ref 65–99)
Glucose-Capillary: 155 mg/dL — ABNORMAL HIGH (ref 65–99)

## 2016-11-21 LAB — TROPONIN I: Troponin I: 8.23 ng/mL (ref <0.03)

## 2016-11-21 LAB — DIGOXIN LEVEL: Digoxin Level: <0.2 ng/mL — ABNORMAL LOW (ref 0.8–2.0)

## 2016-11-21 LAB — PROTIME-INR
INR: 1.42
Prothrombin Time: 17.4 seconds — ABNORMAL HIGH (ref 11.4–15.2)

## 2016-11-21 MED ORDER — FUROSEMIDE 10 MG/ML IJ SOLN
120.0000 mg | Freq: Two times a day (BID) | INTRAVENOUS | Status: AC
  Administered 2016-11-21 – 2016-11-22 (×4): 120 mg via INTRAVENOUS
  Filled 2016-11-21 (×4): qty 12

## 2016-11-21 MED ORDER — INSULIN ASPART 100 UNIT/ML ~~LOC~~ SOLN
3.0000 [IU] | Freq: Three times a day (TID) | SUBCUTANEOUS | Status: DC
  Administered 2016-11-21 – 2016-11-28 (×17): 3 [IU] via SUBCUTANEOUS

## 2016-11-21 MED ORDER — SODIUM CHLORIDE 0.9% FLUSH
10.0000 mL | Freq: Two times a day (BID) | INTRAVENOUS | Status: DC
  Administered 2016-11-21 – 2016-11-23 (×3): 10 mL

## 2016-11-21 MED ORDER — SODIUM CHLORIDE 0.9% FLUSH: 10.0000 mL | INTRAVENOUS | Status: DC | PRN

## 2016-11-21 MED ORDER — CHLORHEXIDINE GLUCONATE CLOTH 2 % EX PADS
6.0000 | MEDICATED_PAD | Freq: Every day | CUTANEOUS | Status: DC
  Administered 2016-11-21 – 2016-11-27 (×7): 6 via TOPICAL

## 2016-11-21 NOTE — Progress Notes (Signed)
Pt on Levophed and awaiting cath. Will hold ambulation till off Levophed. Yves Dill CES, ACSM 10:41 AM 11/21/2016

## 2016-11-21 NOTE — Progress Notes (Signed)
Patient ID: [name], male   DOB: 1966/06/18, 51 y.o.   MRN: [id number]   SUBJECTIVE:  Feeling better today, no chest pain, no dyspnea at rest.  CVP 16.  Co-ox 36.5% on 3/18, up to 55% early this morning on milrinone 0.375 and norepinephrine 3.  Increasing UOP after inotrope/pressors.  SBP 110s this morning.  Creatinine at 2.69. TnI to 8.   Scheduled Meds: . aspirin  81 mg Oral Daily  . atorvastatin  80 mg Oral q1800  . Chlorhexidine Gluconate Cloth  6 each Topical Daily  . digoxin  0.125 mg Oral Daily  . furosemide  120 mg Intravenous BID  . insulin aspart  0-15 Units Subcutaneous TID WC  . insulin aspart  0-5 Units Subcutaneous QHS  . insulin glargine  30 Units Subcutaneous q morning - 10a  . mouth rinse  15 mL Mouth Rinse BID  . sodium chloride flush  10-40 mL Intracatheter Q12H  . sodium chloride flush  10-40 mL Intracatheter Q12H  . sodium chloride flush  3 mL Intravenous Q12H   Continuous Infusions: . heparin 1,500 Units/hr (11/21/16 0540)  . milrinone 0.375 mcg/kg/min (11/21/16 0233)  . norepinephrine (LEVOPHED) Adult infusion 3 mcg/min (11/20/16 1724)   PRN Meds:.sodium chloride, acetaminophen, ALPRAZolam, alum & mag hydroxide-simeth, ondansetron (ZOFRAN) IV, sodium chloride flush, sodium chloride flush, sodium chloride flush, zolpidem    Vitals:   11/21/16 0400 11/21/16 0439 11/21/16 0500 11/21/16 0700  BP:  113/75 126/80 108/70  Pulse: 82 81 81   Resp: 18 18 19 20   Temp: 98.4 F (36.9 C)     TempSrc: Oral     SpO2: 94% 96% 93%   Weight:   204 lb 12.9 oz (92.9 kg)   Height:        Intake/Output Summary (Last 24 hours) at 11/21/16 0736 Last data filed at 11/21/16 0700  Gross per 24 hour  Intake          1339.78 ml  Output             2290 ml  Net          -950.22 ml    LABS: Basic Metabolic Panel:  Recent Labs  11/20/16 1322 11/21/16 0500  NA 133* 133*  K 4.9 4.3  CL 100* 97*  CO2 23 27  GLUCOSE 244* 221*  BUN 51* 53*  CREATININE 2.63*  2.69*  CALCIUM 8.2* 8.1*  MG 1.6*  --    Liver Function Tests:  Recent Labs  11/18/16 1830  AST 60*  ALT 27  ALKPHOS 157*  BILITOT 0.7  PROT 7.6  ALBUMIN 3.1*   No results for input(s): LIPASE, AMYLASE in the last 72 hours. CBC:  Recent Labs  11/18/16 1830 11/21/16 0500  WBC 6.6 8.5  NEUTROABS 4.8  --   HGB 11.6* 9.4*  HCT 36.5* 30.0*  MCV 78.3 77.3*  PLT 246 250   Cardiac Enzymes:  Recent Labs  11/20/16 1125 11/20/16 1811 11/20/16 2313  TROPONINI 5.11* 7.85* 8.23*   BNP: Invalid input(s): POCBNP D-Dimer: No results for input(s): DDIMER in the last 72 hours. Hemoglobin A1C: No results for input(s): HGBA1C in the last 72 hours. Fasting Lipid Panel:  Recent Labs  11/20/16 1125  CHOL 160  HDL 39*  LDLCALC 99  TRIG 111  CHOLHDL 4.1   Thyroid Function Tests:  Recent Labs  11/18/16 1830  TSH 2.149   Anemia Panel: No results for input(s): VITAMINB12, FOLATE, FERRITIN, TIBC, IRON, RETICCTPCT in  the last 72 hours.  RADIOLOGY: Dg Chest 2 View  Result Date: 11/09/2016 CLINICAL DATA:  Fatigue. Dyspnea on exertion and lower abdominal pain. EXAM: CHEST  2 VIEW COMPARISON:  08/20/2016 FINDINGS: There is a left chest wall pacer device with lead in the right atrial appendage and right ventricle. The heart size is enlarged. There is asymmetric elevation of the right hemidiaphragm. There is a moderate right pleural effusion which appears new from previous exam. Pulmonary vascular congestion is identified. IMPRESSION: 1. Moderate right pleural effusion and pulmonary vascular congestion. 2. Cardiac enlargement. Electronically Signed   By: Kerby Moors M.D.   On: 11/09/2016 16:35    PHYSICAL EXAM General: NAD Neck: JVP 14-16, no thyromegaly or thyroid nodule.  Lungs: Clear to auscultation bilaterally with normal respiratory effort. CV: Lateral PMI.  Heart regular S1/S2, +S3, no murmur.  1+ edema to knees bilaterally.    Abdomen: Soft, nontender, no  hepatosplenomegaly, mild distention.  Neurologic: Alert and oriented x 3.  Psych: Normal affect. Extremities: No clubbing or cyanosis.   TELEMETRY: Reviewed telemetry pt in NSR  ASSESSMENT AND PLAN: 51 yo with history of CAD s/p anterior MI in 2010 and chronic systolic CHF (presumed to be ischemic cardiomyopathy) was admitted with about 10 days of increased dyspnea and nausea, found to have low output HF.  1. Acute on chronic systolic CHF: Echo 16/96 with EF 25-30%, ischemic cardiomyopathy.  He was admitted with marked volume overload.  He became hypotensive 3/18 with SBP in 80s, poor UOP.  Co-ox sent, was 36.5% initially with CVP 20+.  Started on norepinephrine + milrinone, increasing UOP with co-ox 55% today.  BP now stable.  CVP 16. - Continue milrinone 0.375 and norepinephrine 3.  Repeat co-ox this morning.  - Lasix 120 mg IV bid today.   - Will need eventual RHC.  2. AKI: Suspect cardiorenal syndrome with low output and hypotension as cause.  Creatinine fairly stable today compared to yesterday at 2.69. Watch carefully with diuresis.  3. CAD: NSTEMI.  ECG is somewhat different with increased T wave inversions in lateral leads compared to prior ECGs.  He had chest pain 3/17 at night.  It is possible that this is demand ischemia in setting of hypotension and volume overload but he merits coronary angiography with troponin up to 8.   - Cover with heparin gtt while INR < 2 (warfarin now held).   - Will give ASA and high dose statin.  - As long as no further chest pain, will hold off on coronary angiography until creatinine stabilizes => 2.69 today.  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  5. DM2: covering with sliding scale.   [name] 11/21/2016 7:52 AM

## 2016-11-21 NOTE — Progress Notes (Signed)
ANTICOAGULATION CONSULT NOTE - Follow Up Consult  Pharmacy Consult for Heparin  Indication: History of DVT  Allergies  Allergen Reactions  . Metformin And Related Diarrhea    Patient Measurements: Height: 5' 8"  (172.7 cm) Weight: 205 lb 14.6 oz (93.4 kg) IBW/kg (Calculated) : 68.4  Vital Signs: Temp: 97.4 F (36.3 C) (03/19 0000) Temp Source: Oral (03/19 0000) BP: 102/66 (03/19 0100) Pulse Rate: 81 (03/19 0100)  Labs:  Recent Labs  11/18/16 1830 11/19/16 0517 11/19/16 1203 11/20/16 0215 11/20/16 1125 11/20/16 1322 11/20/16 1811 11/20/16 1815 11/20/16 2313 11/21/16 0230  HGB 11.6*  --   --   --   --   --   --   --   --   --   HCT 36.5*  --   --   --   --   --   --   --   --   --   PLT 246  --   --   --   --   --   --   --   --   --   LABPROT 15.4* 16.2*  --  17.6*  --   --   --   --   --  17.4*  INR 1.22 1.30  --  1.43  --   --   --   --   --  1.42  HEPARINUNFRC  --   --   --   --   --   --   --  <0.10*  --  0.41  CREATININE 1.35*  --  1.65*  --   --  2.63*  --   --   --   --   TROPONINI  --   --   --   --  5.11*  --  7.85*  --  8.23*  --     Estimated Creatinine Clearance: 36.4 mL/min (A) (by C-G formula based on SCr of 2.63 mg/dL (H)).   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 Noted possible plans for cath on 3/19 pending INR per cards. Warfarin held and pharmacy consulted to bridge with heparin while INR<2.    Heparin level this AM is therapeutic x 1 after rate increase  Goal of Therapy:  Heparin level 0.3-0.7 units/ml Monitor platelets by anticoagulation protocol: Yes   Plan:  -Cont heparin 1500 units/hr -1000 HL  Narda Bonds 11/21/2016,3:47 AM

## 2016-11-21 NOTE — Progress Notes (Signed)
ANTICOAGULATION CONSULT NOTE - Follow Up Consult  Pharmacy Consult for Heparin  Indication: History of DVT  Allergies  Allergen Reactions  . Metformin And Related Diarrhea    Patient Measurements: Height: 5' 8"  (172.7 cm) Weight: 204 lb 12.9 oz (92.9 kg) IBW/kg (Calculated) : 68.4  Vital Signs: Temp: 98.2 F (36.8 C) (03/19 1133) Temp Source: Oral (03/19 1133) BP: 114/78 (03/19 1100) Pulse Rate: 81 (03/19 1000)  Labs:  Recent Labs  11/18/16 1830 11/19/16 0517 11/19/16 1203 11/20/16 0215 11/20/16 1125 11/20/16 1322 11/20/16 1811 11/20/16 1815 11/20/16 2313 11/21/16 0230 11/21/16 0500 11/21/16 1253  HGB 11.6*  --   --   --   --   --   --   --   --   --  9.4*  --   HCT 36.5*  --   --   --   --   --   --   --   --   --  30.0*  --   PLT 246  --   --   --   --   --   --   --   --   --  250  --   LABPROT 15.4* 16.2*  --  17.6*  --   --   --   --   --  17.4*  --   --   INR 1.22 1.30  --  1.43  --   --   --   --   --  1.42  --   --   HEPARINUNFRC  --   --   --   --   --   --   --  <0.10*  --  0.41  --  0.63  CREATININE 1.35*  --  1.65*  --   --  2.63*  --   --   --   --  2.69*  --   TROPONINI  --   --   --   --  5.11*  --  7.85*  --  8.23*  --   --   --     Estimated Creatinine Clearance: 35.5 mL/min (A) (by C-G formula based on SCr of 2.69 mg/dL (H)).   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 Noted possible plans for cath on 3/19 pending INR per cards. Warfarin held and pharmacy consulted to bridge with heparin while INR<2.  Plans noted for cath.  -heparin level= 0.63   Goal of Therapy:  Heparin level 0.3-0.7 units/ml Monitor platelets by anticoagulation protocol: Yes   Plan:  -Cont heparin 1500 units/hr -Daily heparin level and CBC  Hildred Laser, Pharm D 11/21/2016 1:29 PM

## 2016-11-21 NOTE — Progress Notes (Signed)
Inpatient Diabetes Program Recommendations  AACE/ADA: New Consensus Statement on Inpatient Glycemic Control (2015)  Target Ranges:  Prepandial:   less than 140 mg/dL      Peak postprandial:   less than 180 mg/dL (1-2 hours)      Critically ill patients:  140 - 180 mg/dL  Results for [name], [name] (MRN [id number]) as of 11/21/2016 08:47  Ref. Range 11/20/2016 06:32 11/20/2016 11:32 11/20/2016 13:08 11/20/2016 17:19 11/20/2016 22:06 11/21/2016 07:51  Glucose-Capillary Latest Ref Range: 65 - 99 mg/dL 228 (H) 216 (H) 241 (H) 102 (H) 197 (H) 115 (H)   Results for [name], [name] (MRN [id number]) as of 11/21/2016 08:47  Ref. Range 08/20/2016 16:37  Hemoglobin A1C Latest Ref Range: 4.8 - 5.6 % 13.8 (H)   Review of Glycemic Control  Diabetes history: DM2 Outpatient Diabetes medications: Lantus 30 units QAM Current orders for Inpatient glycemic control: Lantus 30 units QAM, Novolog 0-15 units TID with meals, Novolog 0-5 units QHS  Inpatient Diabetes Program Recommendations: Insulin - Meal Coverage: Please consider ordering Novolog 3 units TID with meals for meal coverage if patient eats at least 50% of meals. HgbA1C: Last A1C in the chart was 13.8% on 08/20/16. Please consider ordering a current A1C to evaluate glycemic control over the past 2-3 months.  Thanks, Barnie Alderman, RN, MSN, CDE Diabetes Coordinator Inpatient Diabetes Program [phone] (Team Pager from 8am to 5pm)

## 2016-11-21 NOTE — Telephone Encounter (Signed)
Called to confirm scheduled appointment. Patient requested cancellation due to admission into ICU.

## 2016-11-22 ENCOUNTER — Inpatient Hospital Stay (HOSPITAL_COMMUNITY): Payer: BLUE CROSS/BLUE SHIELD

## 2016-11-22 ENCOUNTER — Ambulatory Visit: Payer: BLUE CROSS/BLUE SHIELD | Admitting: Family Medicine

## 2016-11-22 DIAGNOSIS — I509 Heart failure, unspecified: Secondary | ICD-10-CM

## 2016-11-22 LAB — BASIC METABOLIC PANEL
ANION GAP: 9 (ref 5–15)
ANION GAP: 10 (ref 5–15)
Anion gap: 11 (ref 5–15)
BUN: 49 mg/dL — ABNORMAL HIGH (ref 6–20)
BUN: 44 mg/dL — AB (ref 6–20)
BUN: 46 mg/dL — AB (ref 6–20)
CALCIUM: 8.5 mg/dL — AB (ref 8.9–10.3)
CALCIUM: 8.7 mg/dL — AB (ref 8.9–10.3)
CHLORIDE: 93 mmol/L — AB (ref 101–111)
CHLORIDE: 90 mmol/L — AB (ref 101–111)
CO2: 31 mmol/L (ref 22–32)
CO2: 29 mmol/L (ref 22–32)
CO2: 31 mmol/L (ref 22–32)
Calcium: 9 mg/dL (ref 8.9–10.3)
Chloride: 91 mmol/L — ABNORMAL LOW (ref 101–111)
Creatinine, Ser: 2.27 mg/dL — ABNORMAL HIGH (ref 0.61–1.24)
Creatinine, Ser: 2 mg/dL — ABNORMAL HIGH (ref 0.61–1.24)
Creatinine, Ser: 1.98 mg/dL — ABNORMAL HIGH (ref 0.61–1.24)
GFR calc Af Amer: 36 mL/min — ABNORMAL LOW (ref >60)
GFR calc Af Amer: 42 mL/min — ABNORMAL LOW (ref >60)
GFR calc Af Amer: 43 mL/min — ABNORMAL LOW (ref >60)
GFR calc non Af Amer: 31 mL/min — ABNORMAL LOW (ref >60)
GFR calc non Af Amer: 37 mL/min — ABNORMAL LOW (ref >60)
GFR, EST NON AFRICAN AMERICAN: 37 mL/min — AB (ref >60)
GLUCOSE: 166 mg/dL — AB (ref 65–99)
GLUCOSE: 332 mg/dL — AB (ref 65–99)
GLUCOSE: 166 mg/dL — AB (ref 65–99)
POTASSIUM: 4.6 mmol/L (ref 3.5–5.1)
Potassium: 4.6 mmol/L (ref 3.5–5.1)
Potassium: 4.8 mmol/L (ref 3.5–5.1)
Sodium: 133 mmol/L — ABNORMAL LOW (ref 135–145)
Sodium: 130 mmol/L — ABNORMAL LOW (ref 135–145)
Sodium: 132 mmol/L — ABNORMAL LOW (ref 135–145)

## 2016-11-22 LAB — PROTIME-INR
INR: 1.37
INR: 1.19
PROTHROMBIN TIME: 17 s — AB (ref 11.4–15.2)
Prothrombin Time: 15.1 seconds (ref 11.4–15.2)

## 2016-11-22 LAB — CBC
HEMATOCRIT: 30.8 % — AB (ref 39.0–52.0)
HEMATOCRIT: 34.5 % — AB (ref 39.0–52.0)
HEMOGLOBIN: 9.9 g/dL — AB (ref 13.0–17.0)
HEMOGLOBIN: 11.2 g/dL — AB (ref 13.0–17.0)
MCH: 24.8 pg — ABNORMAL LOW (ref 26.0–34.0)
MCH: 24.8 pg — AB (ref 26.0–34.0)
MCHC: 32.1 g/dL (ref 30.0–36.0)
MCHC: 32.5 g/dL (ref 30.0–36.0)
MCV: 77 fL — AB (ref 78.0–100.0)
MCV: 76.3 fL — AB (ref 78.0–100.0)
Platelets: 284 10*3/uL (ref 150–400)
Platelets: 330 10*3/uL (ref 150–400)
RBC: 4 MIL/uL — AB (ref 4.22–5.81)
RBC: 4.52 MIL/uL (ref 4.22–5.81)
RDW: 16.6 % — AB (ref 11.5–15.5)
RDW: 16.2 % — ABNORMAL HIGH (ref 11.5–15.5)
WBC: 8.9 10*3/uL (ref 4.0–10.5)
WBC: 9.2 10*3/uL (ref 4.0–10.5)

## 2016-11-22 LAB — GLUCOSE, CAPILLARY
Glucose-Capillary: 88 mg/dL (ref 65–99)
Glucose-Capillary: 199 mg/dL — ABNORMAL HIGH (ref 65–99)
Glucose-Capillary: 151 mg/dL — ABNORMAL HIGH (ref 65–99)
Glucose-Capillary: 136 mg/dL — ABNORMAL HIGH (ref 65–99)

## 2016-11-22 LAB — ECHOCARDIOGRAM COMPLETE
HEIGHTINCHES: 68 in
Weight: 3076.8 oz

## 2016-11-22 LAB — HEPARIN LEVEL (UNFRACTIONATED): Heparin Unfractionated: 0.59 IU/mL (ref 0.30–0.70)

## 2016-11-22 LAB — COOXEMETRY PANEL
CARBOXYHEMOGLOBIN: 1.3 % (ref 0.5–1.5)
Methemoglobin: 1 % (ref 0.0–1.5)
O2 Saturation: 72 %
Total hemoglobin: 8.8 g/dL — ABNORMAL LOW (ref 12.0–16.0)

## 2016-11-22 MED ORDER — ASPIRIN 81 MG PO CHEW
81.0000 mg | CHEWABLE_TABLET | ORAL | Status: AC
  Administered 2016-11-23: 81 mg via ORAL

## 2016-11-22 MED ORDER — SORBITOL 70 % SOLN
30.0000 mL | Freq: Once | Status: AC
  Administered 2016-11-22: 30 mL via ORAL
  Filled 2016-11-22: qty 30

## 2016-11-22 MED ORDER — SODIUM CHLORIDE 0.9% FLUSH: 3.0000 mL | INTRAVENOUS | Status: DC | PRN

## 2016-11-22 MED ORDER — SODIUM CHLORIDE 0.9 % IV SOLN
INTRAVENOUS | Status: DC
  Administered 2016-11-23: via INTRAVENOUS

## 2016-11-22 MED ORDER — PERFLUTREN LIPID MICROSPHERE
INTRAVENOUS | Status: AC
  Filled 2016-11-22: qty 10

## 2016-11-22 MED ORDER — PERFLUTREN LIPID MICROSPHERE
1.0000 mL | INTRAVENOUS | Status: AC | PRN
  Administered 2016-11-22: 2 mL via INTRAVENOUS
  Filled 2016-11-22: qty 10

## 2016-11-22 MED ORDER — SODIUM CHLORIDE 0.9 % IV SOLN: 250.0000 mL | INTRAVENOUS | Status: DC | PRN

## 2016-11-22 MED ORDER — SODIUM CHLORIDE 0.9% FLUSH: 3.0000 mL | Freq: Two times a day (BID) | INTRAVENOUS | Status: DC

## 2016-11-22 NOTE — Progress Notes (Signed)
Patient ID: [name], male   DOB: Apr 21, 1966, 51 y.o.   MRN: [id number]   SUBJECTIVE:   Co-ox 36.5% on 3/18, up to 72% on milrinone 0.375 and norepinephrine 3 today. Brisk diuresis noted. Creatinine 2.6>2.27.  CVP now 11.   Overall feeling better. Denies SOB.     Scheduled Meds: . aspirin  81 mg Oral Daily  . atorvastatin  80 mg Oral q1800  . Chlorhexidine Gluconate Cloth  6 each Topical Daily  . digoxin  0.125 mg Oral Daily  . furosemide  120 mg Intravenous BID  . insulin aspart  0-15 Units Subcutaneous TID WC  . insulin aspart  0-5 Units Subcutaneous QHS  . insulin aspart  3 Units Subcutaneous TID WC  . insulin glargine  30 Units Subcutaneous q morning - 10a  . mouth rinse  15 mL Mouth Rinse BID  . sodium chloride flush  10-40 mL Intracatheter Q12H  . sodium chloride flush  10-40 mL Intracatheter Q12H  . sodium chloride flush  3 mL Intravenous Q12H   Continuous Infusions: . heparin 1,500 Units/hr (11/21/16 2231)  . milrinone 0.375 mcg/kg/min (11/21/16 2231)  . norepinephrine (LEVOPHED) Adult infusion 3 mcg/min (11/21/16 1728)   PRN Meds:.sodium chloride, acetaminophen, ALPRAZolam, alum & mag hydroxide-simeth, ondansetron (ZOFRAN) IV, sodium chloride flush, sodium chloride flush, sodium chloride flush, zolpidem    Vitals:   11/22/16 0401 11/22/16 0426 11/22/16 0500 11/22/16 0600  BP: 123/78  109/73 127/78  Pulse: 79 81 78 80  Resp: 14 19 12 17   Temp: 98.4 F (36.9 C)     TempSrc: Oral     SpO2: 97% 95% 97% 98%  Weight: 199 lb 11.8 oz (90.6 kg) 192 lb 4.8 oz (87.2 kg)    Height:        Intake/Output Summary (Last 24 hours) at 11/22/16 0734 Last data filed at 11/22/16 0600  Gross per 24 hour  Intake           1430.4 ml  Output             6985 ml  Net          -5554.6 ml    LABS: Basic Metabolic Panel:  Recent Labs  11/20/16 1322 11/21/16 0500 11/22/16 0428  NA 133* 133* 133*  K 4.9 4.3 4.6  CL 100* 97* 93*  CO2 23 27 31   GLUCOSE 244* 221*  166*  BUN 51* 53* 49*  CREATININE 2.63* 2.69* 2.27*  CALCIUM 8.2* 8.1* 8.5*  MG 1.6*  --   --    Liver Function Tests: No results for input(s): AST, ALT, ALKPHOS, BILITOT, PROT, ALBUMIN in the last 72 hours. No results for input(s): LIPASE, AMYLASE in the last 72 hours. CBC:  Recent Labs  11/21/16 0500 11/22/16 0428  WBC 8.5 8.9  HGB 9.4* 9.9*  HCT 30.0* 30.8*  MCV 77.3* 77.0*  PLT 250 284   Cardiac Enzymes:  Recent Labs  11/20/16 1125 11/20/16 1811 11/20/16 2313  TROPONINI 5.11* 7.85* 8.23*   BNP: Invalid input(s): POCBNP D-Dimer: No results for input(s): DDIMER in the last 72 hours. Hemoglobin A1C: No results for input(s): HGBA1C in the last 72 hours. Fasting Lipid Panel:  Recent Labs  11/20/16 1125  CHOL 160  HDL 39*  LDLCALC 99  TRIG 111  CHOLHDL 4.1   Thyroid Function Tests: No results for input(s): TSH, T4TOTAL, T3FREE, THYROIDAB in the last 72 hours.  Invalid input(s): FREET3 Anemia Panel: No results for input(s): VITAMINB12, FOLATE, FERRITIN,  TIBC, IRON, RETICCTPCT in the last 72 hours.  RADIOLOGY: Dg Chest 2 View  Result Date: 11/09/2016 CLINICAL DATA:  Fatigue. Dyspnea on exertion and lower abdominal pain. EXAM: CHEST  2 VIEW COMPARISON:  08/20/2016 FINDINGS: There is a left chest wall pacer device with lead in the right atrial appendage and right ventricle. The heart size is enlarged. There is asymmetric elevation of the right hemidiaphragm. There is a moderate right pleural effusion which appears new from previous exam. Pulmonary vascular congestion is identified. IMPRESSION: 1. Moderate right pleural effusion and pulmonary vascular congestion. 2. Cardiac enlargement. Electronically Signed   By: Kerby Moors M.D.   On: 11/09/2016 16:35    PHYSICAL EXAM CVP 10-11 General: NAD Neck: JVP to jaw. No thyromegaly or thyroid nodule.  Lungs: CTAB. On room air. CV: Lateral PMI.  Heart regular S1/S2, +S3, no murmur. R and LLE 1+ edema to knees  bilaterally.    Abdomen: Soft, nontender, no hepatosplenomegaly, mild distention.  Neurologic: Alert and oriented x 3.  Psych: Normal affect. Extremities: No clubbing or cyanosis. Warm  TELEMETRY: Reviewed telemetry pt in NSR  ASSESSMENT AND PLAN: 51 yo with history of CAD s/p anterior MI in 2010 and chronic systolic CHF (presumed to be ischemic cardiomyopathy) was admitted with about 10 days of increased dyspnea and nausea, found to have low output HF.  1. Acute on chronic systolic CHF: Echo 40/98 with EF 25-30%, ischemic cardiomyopathy.  He was admitted with marked volume overload.  He became hypotensive 3/18 with SBP in 80s, poor UOP.  Co-ox sent, was 36.5% initially with CVP 20+.  Started on norepinephrine + milrinone.  Today's co-ox is 72% with CVP down to 11.  Great diuresis yesterday. Still volume overloaded on exam.    - Continue milrinone 0.375 and norepinephrine 3.   - Volume status improving. Continue Lasix 120 mg IV bid today but may stop afternoon dose later today.   Add ted hose.  - Will need eventual RHC.  2. AKI: Suspect cardiorenal syndrome with low output and hypotension as cause.  Creatinine trending down 2.69> 2.2.   3. CAD: NSTEMI.  ECG is somewhat different with increased T wave inversions in lateral leads compared to prior ECGs.  He had chest pain 3/17 at night.  It is possible that this is demand ischemia in setting of hypotension and marked volume overload but he merits coronary angiography with troponin up to 8.   - Cover with heparin gtt while INR < 2 (warfarin now held).   - Will give ASA and high dose statin.  - As long as no further chest pain, will hold off on coronary angiography until creatinine stabilizes => 2.69=>2.27 today. Possibly cath Wednesday or Thursday depending on creatinine.  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  5. DM2: covering with sliding scale.   Amy Clegg NP-C  11/22/2016 7:34 AM   Patient seen with NP,  agree with the above note.  He is doing better today, great diuresis and CVP down to 11.  Co-ox up to 72%.  Creatinine trending down.  He is still requiring a lot of support.   - Continue Lasix IV this morning, reassess in afternoon.  - Continue current inotropic support.  - He says that he has been feeling "terrible" since 11/17.  If reversible cause is not found on cath, may need LVAD consideration.   Creatinine trending down, needs RHC/LHC, possible Wednesday but more likely Thursday  depending on renal function.   Kaikoa Magro 11/22/2016 7:55 AM

## 2016-11-22 NOTE — Progress Notes (Addendum)
   Afternoon rounds.   CVP down to 7. BMET reviewed. Creatinine coming down 2.2>2.0 . Stop IV lasix after next dose.   BMET in am.   RHC/LHC in am.    Amy Clegg NP-C  2:58 PM

## 2016-11-22 NOTE — Progress Notes (Signed)
ANTICOAGULATION CONSULT NOTE - Follow Up Consult  Pharmacy Consult for Heparin  Indication: History of DVT  Allergies  Allergen Reactions  . Metformin And Related Diarrhea    Patient Measurements: Height: 5' 8"  (172.7 cm) Weight: 192 lb 4.8 oz (87.2 kg) IBW/kg (Calculated) : 68.4  Vital Signs: Temp: 98 F (36.7 C) (03/20 0750) Temp Source: Oral (03/20 0750) BP: 124/77 (03/20 0900) Pulse Rate: 81 (03/20 0800)  Labs:  Recent Labs  11/20/16 0215 11/20/16 1125 11/20/16 1322 11/20/16 1811  11/20/16 2313 11/21/16 0230 11/21/16 0500 11/21/16 1253 11/22/16 0428 11/22/16 0500  HGB  --   --   --   --   --   --   --  9.4*  --  9.9*  --   HCT  --   --   --   --   --   --   --  30.0*  --  30.8*  --   PLT  --   --   --   --   --   --   --  250  --  284  --   LABPROT 17.6*  --   --   --   --   --  17.4*  --   --   --  17.0*  INR 1.43  --   --   --   --   --  1.42  --   --   --  1.37  HEPARINUNFRC  --   --   --   --   < >  --  0.41  --  0.63  --  0.59  CREATININE  --   --  2.63*  --   --   --   --  2.69*  --  2.27*  --   TROPONINI  --  5.11*  --  7.85*  --  8.23*  --   --   --   --   --   < > = values in this interval not displayed.  Estimated Creatinine Clearance: 40.9 mL/min (A) (by C-G formula based on SCr of 2.27 mg/dL (H)).   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 Noted possible plans for cath on 3/21 or 3/22 pending per cards. Warfarin held and pharmacy consulted to bridge with heparin while INR<2. Plans noted for cath. CBC stable. Heparin level therapeutic at 0.59  Goal of Therapy:  Heparin level 0.3-0.7 units/ml Monitor platelets by anticoagulation protocol: Yes   Plan:  -Cont heparin 1500 units/hr -Daily heparin level and CBC  Jeananne Rama, PharmD Candidate  11/22/2016 9:45 AM

## 2016-11-22 NOTE — Progress Notes (Signed)
  Echocardiogram 2D Echocardiogram has been performed.  [name] 11/22/2016, 4:09 PM

## 2016-11-23 ENCOUNTER — Encounter (HOSPITAL_COMMUNITY): Payer: Self-pay | Admitting: Cardiology

## 2016-11-23 ENCOUNTER — Encounter (HOSPITAL_COMMUNITY)
Admission: AD | Disposition: A | Discharge status: Home / Self Care | Payer: Self-pay | Source: Ambulatory Visit | Attending: Cardiology

## 2016-11-23 DIAGNOSIS — I2510 Atherosclerotic heart disease of native coronary artery without angina pectoris: Secondary | ICD-10-CM

## 2016-11-23 DIAGNOSIS — I214 Non-ST elevation (NSTEMI) myocardial infarction: Secondary | ICD-10-CM

## 2016-11-23 DIAGNOSIS — I5021 Acute systolic (congestive) heart failure: Secondary | ICD-10-CM | POA: Diagnosis present

## 2016-11-23 HISTORY — PX: RIGHT/LEFT HEART CATH AND CORONARY ANGIOGRAPHY: CATH118266

## 2016-11-23 LAB — BASIC METABOLIC PANEL
Anion gap: 11 (ref 5–15)
BUN: 41 mg/dL — ABNORMAL HIGH (ref 6–20)
CO2: 31 mmol/L (ref 22–32)
Calcium: 8.9 mg/dL (ref 8.9–10.3)
Chloride: 88 mmol/L — ABNORMAL LOW (ref 101–111)
Creatinine, Ser: 1.92 mg/dL — ABNORMAL HIGH (ref 0.61–1.24)
GFR calc non Af Amer: 38 mL/min — ABNORMAL LOW (ref >60)
GFR, EST AFRICAN AMERICAN: 45 mL/min — AB (ref >60)
Glucose, Bld: 250 mg/dL — ABNORMAL HIGH (ref 65–99)
POTASSIUM: 4.5 mmol/L (ref 3.5–5.1)
Sodium: 130 mmol/L — ABNORMAL LOW (ref 135–145)

## 2016-11-23 LAB — GLUCOSE, CAPILLARY
GLUCOSE-CAPILLARY: 86 mg/dL (ref 65–99)
Glucose-Capillary: 97 mg/dL (ref 65–99)
Glucose-Capillary: 71 mg/dL (ref 65–99)
Glucose-Capillary: 170 mg/dL — ABNORMAL HIGH (ref 65–99)
Glucose-Capillary: 174 mg/dL — ABNORMAL HIGH (ref 65–99)

## 2016-11-23 LAB — COOXEMETRY PANEL
CARBOXYHEMOGLOBIN: 1.1 % (ref 0.5–1.5)
Methemoglobin: 1 % (ref 0.0–1.5)
O2 Saturation: 68.6 %
Total hemoglobin: 12.2 g/dL (ref 12.0–16.0)

## 2016-11-23 LAB — CBC
HEMATOCRIT: 35.4 % — AB (ref 39.0–52.0)
HEMOGLOBIN: 11.5 g/dL — AB (ref 13.0–17.0)
MCH: 24.8 pg — AB (ref 26.0–34.0)
MCHC: 32.5 g/dL (ref 30.0–36.0)
MCV: 76.3 fL — AB (ref 78.0–100.0)
Platelets: 330 10*3/uL (ref 150–400)
RBC: 4.64 MIL/uL (ref 4.22–5.81)
RDW: 16.1 % — ABNORMAL HIGH (ref 11.5–15.5)
WBC: 8.6 10*3/uL (ref 4.0–10.5)

## 2016-11-23 LAB — POCT I-STAT 3, VENOUS BLOOD GAS (G3P V)
Acid-Base Excess: 5 mmol/L — ABNORMAL HIGH (ref 0.0–2.0)
Acid-Base Excess: 8 mmol/L — ABNORMAL HIGH (ref 0.0–2.0)
Bicarbonate: 31.4 mmol/L — ABNORMAL HIGH (ref 20.0–28.0)
Bicarbonate: 33.9 mmol/L — ABNORMAL HIGH (ref 20.0–28.0)
O2 Saturation: 67 %
O2 Saturation: 67 %
PCO2 VEN: 50.6 mmHg (ref 44.0–60.0)
PH VEN: 7.402 (ref 7.250–7.430)
PH VEN: 7.407 (ref 7.250–7.430)
TCO2: 33 mmol/L (ref 0–100)
TCO2: 35 mmol/L (ref 0–100)
pCO2, Ven: 53.8 mmHg (ref 44.0–60.0)
pO2, Ven: 35 mmHg (ref 32.0–45.0)
pO2, Ven: 35 mmHg (ref 32.0–45.0)

## 2016-11-23 LAB — PROTIME-INR
INR: 1.17
Prothrombin Time: 15 seconds (ref 11.4–15.2)

## 2016-11-23 LAB — POCT ACTIVATED CLOTTING TIME: ACTIVATED CLOTTING TIME: 202 s

## 2016-11-23 LAB — MAGNESIUM: Magnesium: 1.9 mg/dL (ref 1.7–2.4)

## 2016-11-23 LAB — HEPARIN LEVEL (UNFRACTIONATED): Heparin Unfractionated: 0.52 IU/mL (ref 0.30–0.70)

## 2016-11-23 SURGERY — RIGHT/LEFT HEART CATH AND CORONARY ANGIOGRAPHY
Anesthesia: LOCAL

## 2016-11-23 MED ORDER — HEPARIN (PORCINE) IN NACL 100-0.45 UNIT/ML-% IJ SOLN
1500.0000 [IU]/h | INTRAMUSCULAR | Status: DC
  Administered 2016-11-23 – 2016-11-25 (×3): 1500 [IU]/h via INTRAVENOUS
  Filled 2016-11-23 (×3): qty 250

## 2016-11-23 MED ORDER — HEPARIN (PORCINE) IN NACL 2-0.9 UNIT/ML-% IJ SOLN
INTRAMUSCULAR | Status: AC
  Filled 2016-11-23: qty 1000

## 2016-11-23 MED ORDER — ACETAMINOPHEN 325 MG PO TABS: 650.0000 mg | ORAL_TABLET | ORAL | Status: DC | PRN

## 2016-11-23 MED ORDER — FENTANYL CITRATE (PF) 100 MCG/2ML IJ SOLN
INTRAMUSCULAR | Status: DC | PRN
  Administered 2016-11-23 (×2): 25 ug via INTRAVENOUS

## 2016-11-23 MED ORDER — HEPARIN SODIUM (PORCINE) 1000 UNIT/ML IJ SOLN
INTRAMUSCULAR | Status: AC
  Filled 2016-11-23: qty 1

## 2016-11-23 MED ORDER — SODIUM CHLORIDE 0.9 % IV SOLN: 250.0000 mL | INTRAVENOUS | Status: DC | PRN

## 2016-11-23 MED ORDER — INSULIN GLARGINE 100 UNIT/ML ~~LOC~~ SOLN
30.0000 [IU] | Freq: Every morning | SUBCUTANEOUS | Status: DC
  Administered 2016-11-23 – 2016-11-28 (×6): 30 [IU] via SUBCUTANEOUS
  Filled 2016-11-23 (×7): qty 0.3

## 2016-11-23 MED ORDER — HEPARIN (PORCINE) IN NACL 2-0.9 UNIT/ML-% IJ SOLN
INTRAMUSCULAR | Status: DC | PRN
  Administered 2016-11-23: 1000 mL

## 2016-11-23 MED ORDER — IOPAMIDOL (ISOVUE-370) INJECTION 76%
INTRAVENOUS | Status: DC | PRN
  Administered 2016-11-23: 60 mL via INTRA_ARTERIAL

## 2016-11-23 MED ORDER — IOPAMIDOL (ISOVUE-370) INJECTION 76%
INTRAVENOUS | Status: AC
  Filled 2016-11-23: qty 100

## 2016-11-23 MED ORDER — HEPARIN SODIUM (PORCINE) 1000 UNIT/ML IJ SOLN
INTRAMUSCULAR | Status: DC | PRN
  Administered 2016-11-23: 4000 [IU] via INTRAVENOUS

## 2016-11-23 MED ORDER — FENTANYL CITRATE (PF) 100 MCG/2ML IJ SOLN
INTRAMUSCULAR | Status: AC
  Filled 2016-11-23: qty 2

## 2016-11-23 MED ORDER — LIDOCAINE HCL (PF) 1 % IJ SOLN
INTRAMUSCULAR | Status: AC
  Filled 2016-11-23: qty 30

## 2016-11-23 MED ORDER — MIDAZOLAM HCL 2 MG/2ML IJ SOLN
INTRAMUSCULAR | Status: AC
  Filled 2016-11-23: qty 2

## 2016-11-23 MED ORDER — CLOPIDOGREL BISULFATE 75 MG PO TABS
75.0000 mg | ORAL_TABLET | Freq: Every day | ORAL | Status: DC
  Administered 2016-11-24: 75 mg via ORAL
  Filled 2016-11-23: qty 1

## 2016-11-23 MED ORDER — SODIUM CHLORIDE 0.9 % WEIGHT BASED INFUSION: 1.0000 mL/kg/h | INTRAVENOUS | Status: AC

## 2016-11-23 MED ORDER — VERAPAMIL HCL 2.5 MG/ML IV SOLN
INTRAVENOUS | Status: DC | PRN
  Administered 2016-11-23: 10 mL via INTRA_ARTERIAL

## 2016-11-23 MED ORDER — SODIUM CHLORIDE 0.9% FLUSH: 3.0000 mL | INTRAVENOUS | Status: DC | PRN

## 2016-11-23 MED ORDER — VERAPAMIL HCL 2.5 MG/ML IV SOLN
INTRAVENOUS | Status: AC
  Filled 2016-11-23: qty 2

## 2016-11-23 MED ORDER — LIDOCAINE HCL (PF) 1 % IJ SOLN
INTRAMUSCULAR | Status: DC | PRN
  Administered 2016-11-23: 15 mL
  Administered 2016-11-23: 2 mL

## 2016-11-23 MED ORDER — MIDAZOLAM HCL 2 MG/2ML IJ SOLN
INTRAMUSCULAR | Status: DC | PRN
  Administered 2016-11-23 (×2): 1 mg via INTRAVENOUS

## 2016-11-23 MED ORDER — CLOPIDOGREL BISULFATE 300 MG PO TABS: 300.0000 mg | ORAL_TABLET | Freq: Every day | ORAL | Status: DC

## 2016-11-23 MED ORDER — CLOPIDOGREL BISULFATE 300 MG PO TABS
300.0000 mg | ORAL_TABLET | Freq: Once | ORAL | Status: AC
  Administered 2016-11-23: 300 mg via ORAL
  Filled 2016-11-23: qty 1

## 2016-11-23 MED ORDER — ONDANSETRON HCL 4 MG/2ML IJ SOLN: 4.0000 mg | Freq: Four times a day (QID) | INTRAMUSCULAR | Status: DC | PRN

## 2016-11-23 MED ORDER — SODIUM CHLORIDE 0.9% FLUSH
3.0000 mL | Freq: Two times a day (BID) | INTRAVENOUS | Status: DC
  Administered 2016-11-24 – 2016-11-28 (×7): 3 mL via INTRAVENOUS

## 2016-11-23 SURGICAL SUPPLY — 12 items
CATH IMPULSE 5F ANG/FL3.5 (CATHETERS) ×1 IMPLANT
CATH SWAN GANZ 7F STRAIGHT (CATHETERS) ×1 IMPLANT
DEVICE RAD COMP TR BAND LRG (VASCULAR PRODUCTS) ×1 IMPLANT
GLIDESHEATH SLEND SS 6F .021 (SHEATH) ×1 IMPLANT
GUIDEWIRE INQWIRE 1.5J.035X260 (WIRE) IMPLANT
INQWIRE 1.5J .035X260CM (WIRE) ×2
KIT HEART LEFT (KITS) ×2 IMPLANT
PACK CARDIAC CATHETERIZATION (CUSTOM PROCEDURE TRAY) ×2 IMPLANT
SHEATH PINNACLE 7F 10CM (SHEATH) ×1 IMPLANT
TRANSDUCER W/STOPCOCK (MISCELLANEOUS) ×2 IMPLANT
TUBING CIL FLEX 10 FLL-RA (TUBING) ×2 IMPLANT
WIRE EMERALD 3MM-J .035X150CM (WIRE) ×1 IMPLANT

## 2016-11-23 NOTE — Progress Notes (Signed)
CBG 71. 4OZ apple juice PO. Will take sandwich with pt to 4N12. Asymptomatic.

## 2016-11-23 NOTE — Progress Notes (Signed)
TR Band removed 17:15. No bleeding at right radial site. Will continue to monitor.

## 2016-11-23 NOTE — Progress Notes (Addendum)
Site area: RFV Site Prior to Removal:  Level 0 Pressure Applied For: 15 min Manual:  yes  Patient Status During Pull:  stable Post Pull Site:  Level 0 Post Pull Instructions Given: yes  Post Pull Pulses Present: palpable Dressing Applied:  tegaderm Bedrest begins @ [id number]  Comments:

## 2016-11-23 NOTE — Interval H&P Note (Signed)
History and Physical Interval Note:  11/23/2016 1:23 PM  [name]  has presented today for surgery, with the diagnosis of cp  The various methods of treatment have been discussed with the patient and family. After consideration of risks, benefits and other options for treatment, the patient has consented to  Procedure(s): Right/Left Heart Cath and Coronary Angiography (N/A) as a surgical intervention .  The patient's history has been reviewed, patient examined, no change in status, stable for surgery.  I have reviewed the patient's chart and labs.  Questions were answered to the patient's satisfaction.     Kathye Cipriani [employer]

## 2016-11-23 NOTE — Progress Notes (Addendum)
Patient ID: [name], male   DOB: Aug 09, 1966, 51 y.o.   MRN: [id number]   SUBJECTIVE:   Co-ox 36.5% on 3/18, up to 68% on milrinone 0.375 and norepinephrine 3 today. Brisk diuresis noted again yesterday. Creatinine 2.6>2.27>1.9.  CVP now 3-4.   Overall feeling better.  No dyspnea.   Scheduled Meds: . aspirin  81 mg Oral Daily  . atorvastatin  80 mg Oral q1800  . Chlorhexidine Gluconate Cloth  6 each Topical Daily  . digoxin  0.125 mg Oral Daily  . insulin aspart  0-15 Units Subcutaneous TID WC  . insulin aspart  0-5 Units Subcutaneous QHS  . insulin aspart  3 Units Subcutaneous TID WC  . insulin glargine  30 Units Subcutaneous q morning - 10a  . mouth rinse  15 mL Mouth Rinse BID  . sodium chloride flush  10-40 mL Intracatheter Q12H  . sodium chloride flush  10-40 mL Intracatheter Q12H  . sodium chloride flush  3 mL Intravenous Q12H  . sodium chloride flush  3 mL Intravenous Q12H   Continuous Infusions: . sodium chloride 10 mL/hr at 11/23/16 0027  . heparin 1,500 Units/hr (11/22/16 1900)  . milrinone 0.375 mcg/kg/min (11/23/16 0332)   PRN Meds:.sodium chloride, sodium chloride, acetaminophen, ALPRAZolam, alum & mag hydroxide-simeth, ondansetron (ZOFRAN) IV, sodium chloride flush, sodium chloride flush, sodium chloride flush, sodium chloride flush, zolpidem    Vitals:   11/23/16 0400 11/23/16 0417 11/23/16 0500 11/23/16 0600  BP: 130/77  119/74 (!) 136/93  Pulse: 81 90 (!) 104 93  Resp: 11 18 17 19   Temp:    97.9 F (36.6 C)  TempSrc:    Oral  SpO2: 97% 94% 95% 98%  Weight:  177 lb 14.6 oz (80.7 kg)    Height:        Intake/Output Summary (Last 24 hours) at 11/23/16 0731 Last data filed at 11/23/16 0600  Gross per 24 hour  Intake           1677.2 ml  Output             6915 ml  Net          -5237.8 ml    LABS: Basic Metabolic Panel:  Recent Labs  11/20/16 1322  11/22/16 1739 11/23/16 0430  NA 133*  < > 132* 130*  K 4.9  < > 4.6 4.5  CL 100*  < >  90* 88*  CO2 23  < > 31 31  GLUCOSE 244*  < > 166* 250*  BUN 51*  < > 46* 41*  CREATININE 2.63*  < > 1.98* 1.92*  CALCIUM 8.2*  < > 9.0 8.9  MG 1.6*  --   --  1.9  < > = values in this interval not displayed. Liver Function Tests: No results for input(s): AST, ALT, ALKPHOS, BILITOT, PROT, ALBUMIN in the last 72 hours. No results for input(s): LIPASE, AMYLASE in the last 72 hours. CBC:  Recent Labs  11/22/16 1739 11/23/16 0430  WBC 9.2 8.6  HGB 11.2* 11.5*  HCT 34.5* 35.4*  MCV 76.3* 76.3*  PLT 330 330   Cardiac Enzymes:  Recent Labs  11/20/16 1125 11/20/16 1811 11/20/16 2313  TROPONINI 5.11* 7.85* 8.23*   BNP: Invalid input(s): POCBNP D-Dimer: No results for input(s): DDIMER in the last 72 hours. Hemoglobin A1C: No results for input(s): HGBA1C in the last 72 hours. Fasting Lipid Panel:  Recent Labs  11/20/16 1125  CHOL 160  HDL 39*  LDLCALC  99  TRIG 111  CHOLHDL 4.1   Thyroid Function Tests: No results for input(s): TSH, T4TOTAL, T3FREE, THYROIDAB in the last 72 hours.  Invalid input(s): FREET3 Anemia Panel: No results for input(s): VITAMINB12, FOLATE, FERRITIN, TIBC, IRON, RETICCTPCT in the last 72 hours.  RADIOLOGY: Dg Chest 2 View  Result Date: 11/09/2016 CLINICAL DATA:  Fatigue. Dyspnea on exertion and lower abdominal pain. EXAM: CHEST  2 VIEW COMPARISON:  08/20/2016 FINDINGS: There is a left chest wall pacer device with lead in the right atrial appendage and right ventricle. The heart size is enlarged. There is asymmetric elevation of the right hemidiaphragm. There is a moderate right pleural effusion which appears new from previous exam. Pulmonary vascular congestion is identified. IMPRESSION: 1. Moderate right pleural effusion and pulmonary vascular congestion. 2. Cardiac enlargement. Electronically Signed   By: Kerby Moors M.D.   On: 11/09/2016 16:35    PHYSICAL EXAM CVP 4 General: NAD Neck: JVP 7 cm. No thyromegaly or thyroid nodule.    Lungs: CTAB. On room air. CV: Lateral PMI.  Heart regular S1/S2, +S3, no murmur. No edema.    Abdomen: Soft, nontender, no hepatosplenomegaly, mild distention.  Neurologic: Alert and oriented x 3.  Psych: Normal affect. Extremities: No clubbing or cyanosis. Warm  TELEMETRY: Reviewed telemetry pt in NSR  ASSESSMENT AND PLAN: 51 yo with history of CAD s/p anterior MI in 2010 and chronic systolic CHF (presumed to be ischemic cardiomyopathy) was admitted with about 10 days of increased dyspnea and nausea, found to have low output HF.  1. Acute on chronic systolic CHF: Echo 57/50 with EF 25-30%, ischemic cardiomyopathy.  He was admitted with marked volume overload.  He became hypotensive 3/18 with SBP in 80s, poor UOP.  Co-ox sent, was 36.5% initially with CVP 20+.  Started on norepinephrine + milrinone.  Today's co-ox is 68% with CVP down to 3-4.  Great diuresis yesterday. Volume status looks good on exam. - Continue milrinone 0.375, stop norepinephrine.  Will likely add hydralazine/nitrates later today after cath.    - Hold diuretics for now with low CVP.  - RHC/LHC today.  Discussed risks/benefits with patient and he agrees to proceed.  2. AKI: Suspect cardiorenal syndrome with low output and hypotension as cause.  Creatinine trending down 2.69> 2.2>1.9.   3. CAD: NSTEMI.  ECG is somewhat different with increased T wave inversions in lateral leads compared to prior ECGs.  He had chest pain 3/17 at night.  It is possible that this is demand ischemia in setting of cardiogenic shock and marked volume overload but he merits coronary angiography with troponin up to 8.   - Cover with heparin gtt while INR < 2 (warfarin now held).   - Will give ASA and high dose statin.  - Creatinine trending down, plan coronary angiography today as above.  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  5. DM2: covering with sliding scale.   Gregary Blackard  11/23/2016 7:31 AM

## 2016-11-23 NOTE — H&P (View-Only) (Signed)
Patient ID: [name], male   DOB: July 20, 1966, 51 y.o.   MRN: [id number]   SUBJECTIVE:   Co-ox 36.5% on 3/18, up to 68% on milrinone 0.375 and norepinephrine 3 today. Brisk diuresis noted again yesterday. Creatinine 2.6>2.27>1.9.  CVP now 3-4.   Overall feeling better.  No dyspnea.   Scheduled Meds: . aspirin  81 mg Oral Daily  . atorvastatin  80 mg Oral q1800  . Chlorhexidine Gluconate Cloth  6 each Topical Daily  . digoxin  0.125 mg Oral Daily  . insulin aspart  0-15 Units Subcutaneous TID WC  . insulin aspart  0-5 Units Subcutaneous QHS  . insulin aspart  3 Units Subcutaneous TID WC  . insulin glargine  30 Units Subcutaneous q morning - 10a  . mouth rinse  15 mL Mouth Rinse BID  . sodium chloride flush  10-40 mL Intracatheter Q12H  . sodium chloride flush  10-40 mL Intracatheter Q12H  . sodium chloride flush  3 mL Intravenous Q12H  . sodium chloride flush  3 mL Intravenous Q12H   Continuous Infusions: . sodium chloride 10 mL/hr at 11/23/16 0027  . heparin 1,500 Units/hr (11/22/16 1900)  . milrinone 0.375 mcg/kg/min (11/23/16 0332)   PRN Meds:.sodium chloride, sodium chloride, acetaminophen, ALPRAZolam, alum & mag hydroxide-simeth, ondansetron (ZOFRAN) IV, sodium chloride flush, sodium chloride flush, sodium chloride flush, sodium chloride flush, zolpidem    Vitals:   11/23/16 0400 11/23/16 0417 11/23/16 0500 11/23/16 0600  BP: 130/77  119/74 (!) 136/93  Pulse: 81 90 (!) 104 93  Resp: 11 18 17 19   Temp:    97.9 F (36.6 C)  TempSrc:    Oral  SpO2: 97% 94% 95% 98%  Weight:  177 lb 14.6 oz (80.7 kg)    Height:        Intake/Output Summary (Last 24 hours) at 11/23/16 0731 Last data filed at 11/23/16 0600  Gross per 24 hour  Intake           1677.2 ml  Output             6915 ml  Net          -5237.8 ml    LABS: Basic Metabolic Panel:  Recent Labs  11/20/16 1322  11/22/16 1739 11/23/16 0430  NA 133*  < > 132* 130*  K 4.9  < > 4.6 4.5  CL 100*  < >  90* 88*  CO2 23  < > 31 31  GLUCOSE 244*  < > 166* 250*  BUN 51*  < > 46* 41*  CREATININE 2.63*  < > 1.98* 1.92*  CALCIUM 8.2*  < > 9.0 8.9  MG 1.6*  --   --  1.9  < > = values in this interval not displayed. Liver Function Tests: No results for input(s): AST, ALT, ALKPHOS, BILITOT, PROT, ALBUMIN in the last 72 hours. No results for input(s): LIPASE, AMYLASE in the last 72 hours. CBC:  Recent Labs  11/22/16 1739 11/23/16 0430  WBC 9.2 8.6  HGB 11.2* 11.5*  HCT 34.5* 35.4*  MCV 76.3* 76.3*  PLT 330 330   Cardiac Enzymes:  Recent Labs  11/20/16 1125 11/20/16 1811 11/20/16 2313  TROPONINI 5.11* 7.85* 8.23*   BNP: Invalid input(s): POCBNP D-Dimer: No results for input(s): DDIMER in the last 72 hours. Hemoglobin A1C: No results for input(s): HGBA1C in the last 72 hours. Fasting Lipid Panel:  Recent Labs  11/20/16 1125  CHOL 160  HDL 39*  LDLCALC  99  TRIG 111  CHOLHDL 4.1   Thyroid Function Tests: No results for input(s): TSH, T4TOTAL, T3FREE, THYROIDAB in the last 72 hours.  Invalid input(s): FREET3 Anemia Panel: No results for input(s): VITAMINB12, FOLATE, FERRITIN, TIBC, IRON, RETICCTPCT in the last 72 hours.  RADIOLOGY: Dg Chest 2 View  Result Date: 11/09/2016 CLINICAL DATA:  Fatigue. Dyspnea on exertion and lower abdominal pain. EXAM: CHEST  2 VIEW COMPARISON:  08/20/2016 FINDINGS: There is a left chest wall pacer device with lead in the right atrial appendage and right ventricle. The heart size is enlarged. There is asymmetric elevation of the right hemidiaphragm. There is a moderate right pleural effusion which appears new from previous exam. Pulmonary vascular congestion is identified. IMPRESSION: 1. Moderate right pleural effusion and pulmonary vascular congestion. 2. Cardiac enlargement. Electronically Signed   By: Kerby Moors M.D.   On: 11/09/2016 16:35    PHYSICAL EXAM CVP 4 General: NAD Neck: JVP 7 cm. No thyromegaly or thyroid nodule.    Lungs: CTAB. On room air. CV: Lateral PMI.  Heart regular S1/S2, +S3, no murmur. No edema.    Abdomen: Soft, nontender, no hepatosplenomegaly, mild distention.  Neurologic: Alert and oriented x 3.  Psych: Normal affect. Extremities: No clubbing or cyanosis. Warm  TELEMETRY: Reviewed telemetry pt in NSR  ASSESSMENT AND PLAN: 51 yo with history of CAD s/p anterior MI in 2010 and chronic systolic CHF (presumed to be ischemic cardiomyopathy) was admitted with about 10 days of increased dyspnea and nausea, found to have low output HF.  1. Acute on chronic systolic CHF: Echo 70/35 with EF 25-30%, ischemic cardiomyopathy.  He was admitted with marked volume overload.  He became hypotensive 3/18 with SBP in 80s, poor UOP.  Co-ox sent, was 36.5% initially with CVP 20+.  Started on norepinephrine + milrinone.  Today's co-ox is 68% with CVP down to 3-4.  Great diuresis yesterday. Volume status looks good on exam. - Continue milrinone 0.375, stop norepinephrine.  Will likely add hydralazine/nitrates later today after cath.    - Hold diuretics for now with low CVP.  - RHC/LHC today.  Discussed risks/benefits with patient and he agrees to proceed.  2. AKI: Suspect cardiorenal syndrome with low output and hypotension as cause.  Creatinine trending down 2.69> 2.2>1.9.   3. CAD: NSTEMI.  ECG is somewhat different with increased T wave inversions in lateral leads compared to prior ECGs.  He had chest pain 3/17 at night.  It is possible that this is demand ischemia in setting of cardiogenic shock and marked volume overload but he merits coronary angiography with troponin up to 8.   - Cover with heparin gtt while INR < 2 (warfarin now held).   - Will give ASA and high dose statin.  - Creatinine trending down, plan coronary angiography today as above.  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  5. DM2: covering with sliding scale.   Kevis Qu  11/23/2016 7:31 AM

## 2016-11-23 NOTE — Progress Notes (Signed)
ANTICOAGULATION CONSULT NOTE - Initial Consult  Pharmacy Consult for restart Heparin  Indication: History of DVT  Allergies  Allergen Reactions  . Metformin And Related Diarrhea    Patient Measurements: Height: 5' 8"  (172.7 cm) Weight: 177 lb 14.6 oz (80.7 kg) IBW/kg (Calculated) : 68.4  Vital Signs: Temp: 97.9 F (36.6 C) (03/21 1142) Temp Source: Oral (03/21 1142) BP: 101/24 (03/21 1525) Pulse Rate: 86 (03/21 1525)  Labs:  Recent Labs  11/20/16 1811  11/20/16 2313  11/21/16 1253 11/22/16 0428 11/22/16 0500 11/22/16 1416 11/22/16 1739 11/23/16 0430  HGB  --   --   --   < >  --  9.9*  --   --  11.2* 11.5*  HCT  --   --   --   < >  --  30.8*  --   --  34.5* 35.4*  PLT  --   --   --   < >  --  284  --   --  330 330  LABPROT  --   --   --   < >  --   --  17.0*  --  15.1 15.0  INR  --   --   --   < >  --   --  1.37  --  1.19 1.17  HEPARINUNFRC  --   < >  --   < > 0.63  --  0.59  --   --  0.52  CREATININE  --   --   --   < >  --  2.27*  --  2.00* 1.98* 1.92*  TROPONINI 7.85*  --  8.23*  --   --   --   --   --   --   --   < > = values in this interval not displayed.  Estimated Creatinine Clearance: 43.5 mL/min (A) (by C-G formula based on SCr of 1.92 mg/dL (H)).   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 Noted possible plans for cath on 3/21 or 3/22 pending per cards. Warfarin held and pharmacy consulted to bridge with heparin while INR<2. Patient now s/p cath and pharmacy consulted to restart heparin 8 hours post sheath removal. Sheath was removed at 1410 today. Heparin level was therapeutic on 1500 units/hr.  Goal of Therapy:  Heparin level 0.3-0.7 units/ml Monitor platelets by anticoagulation protocol: Yes   Plan:  Restart heparin at 1500 units/hr @ 2200 tonight Check anti-Xa level in 6 hours and daily while on heparin Continue to monitor H&H and platelets   Thank you for allowing [place] to participate in this patients care.  Jens Som, PharmD Clinical phone: [phone] Or call main pharmacy at: [phone] 11/23/2016 4:05 PM

## 2016-11-24 LAB — CBC
HCT: 35.2 % — ABNORMAL LOW (ref 39.0–52.0)
Hemoglobin: 11.4 g/dL — ABNORMAL LOW (ref 13.0–17.0)
MCH: 24.7 pg — ABNORMAL LOW (ref 26.0–34.0)
MCHC: 32.4 g/dL (ref 30.0–36.0)
MCV: 76.4 fL — ABNORMAL LOW (ref 78.0–100.0)
Platelets: 340 10*3/uL (ref 150–400)
RBC: 4.61 MIL/uL (ref 4.22–5.81)
RDW: 16.2 % — AB (ref 11.5–15.5)
WBC: 7.2 10*3/uL (ref 4.0–10.5)

## 2016-11-24 LAB — GLUCOSE, CAPILLARY
GLUCOSE-CAPILLARY: 181 mg/dL — AB (ref 65–99)
GLUCOSE-CAPILLARY: 124 mg/dL — AB (ref 65–99)
Glucose-Capillary: 204 mg/dL — ABNORMAL HIGH (ref 65–99)
Glucose-Capillary: 227 mg/dL — ABNORMAL HIGH (ref 65–99)

## 2016-11-24 LAB — BASIC METABOLIC PANEL
Anion gap: 11 (ref 5–15)
BUN: 37 mg/dL — AB (ref 6–20)
CALCIUM: 8.7 mg/dL — AB (ref 8.9–10.3)
CHLORIDE: 93 mmol/L — AB (ref 101–111)
CO2: 30 mmol/L (ref 22–32)
Creatinine, Ser: 1.92 mg/dL — ABNORMAL HIGH (ref 0.61–1.24)
GFR, EST AFRICAN AMERICAN: 45 mL/min — AB (ref >60)
GFR, EST NON AFRICAN AMERICAN: 38 mL/min — AB (ref >60)
GLUCOSE: 147 mg/dL — AB (ref 65–99)
POTASSIUM: 4.9 mmol/L (ref 3.5–5.1)
Sodium: 134 mmol/L — ABNORMAL LOW (ref 135–145)

## 2016-11-24 LAB — COOXEMETRY PANEL
CARBOXYHEMOGLOBIN: 1.1 % (ref 0.5–1.5)
Methemoglobin: 1.1 % (ref 0.0–1.5)
O2 Saturation: 73.3 %
Total hemoglobin: 10.1 g/dL — ABNORMAL LOW (ref 12.0–16.0)

## 2016-11-24 LAB — PROTIME-INR
INR: 1.19
PROTHROMBIN TIME: 15.1 s (ref 11.4–15.2)

## 2016-11-24 LAB — HEPARIN LEVEL (UNFRACTIONATED): Heparin Unfractionated: 0.54 IU/mL (ref 0.30–0.70)

## 2016-11-24 MED ORDER — CLOPIDOGREL BISULFATE 75 MG PO TABS
75.0000 mg | ORAL_TABLET | ORAL | Status: AC
  Administered 2016-11-25: 75 mg via ORAL
  Filled 2016-11-24: qty 1

## 2016-11-24 MED ORDER — SODIUM CHLORIDE 0.9 % IV SOLN
INTRAVENOUS | Status: DC
  Administered 2016-11-25 (×2): via INTRAVENOUS

## 2016-11-24 MED ORDER — SODIUM BICARBONATE 8.4 % IV SOLN: 100.0000 meq | Freq: Once | INTRAVENOUS | Status: DC

## 2016-11-24 MED ORDER — SODIUM CHLORIDE 0.9% FLUSH: 3.0000 mL | INTRAVENOUS | Status: DC | PRN

## 2016-11-24 MED ORDER — CLOPIDOGREL BISULFATE 75 MG PO TABS
75.0000 mg | ORAL_TABLET | Freq: Every day | ORAL | Status: DC
  Administered 2016-11-26 – 2016-11-28 (×3): 75 mg via ORAL
  Filled 2016-11-24 (×3): qty 1

## 2016-11-24 MED ORDER — ASPIRIN 81 MG PO CHEW
81.0000 mg | CHEWABLE_TABLET | Freq: Every day | ORAL | Status: DC
  Administered 2016-11-26 – 2016-11-28 (×3): 81 mg via ORAL
  Filled 2016-11-24 (×3): qty 1

## 2016-11-24 MED ORDER — MILRINONE LACTATE IN DEXTROSE 20-5 MG/100ML-% IV SOLN
0.1250 ug/kg/min | INTRAVENOUS | Status: DC
  Administered 2016-11-24 – 2016-11-25 (×2): 0.25 ug/kg/min via INTRAVENOUS
  Administered 2016-11-26: 0.125 ug/kg/min via INTRAVENOUS
  Filled 2016-11-24 (×3): qty 100

## 2016-11-24 MED ORDER — SODIUM CHLORIDE 0.9 % IV SOLN: 250.0000 mL | INTRAVENOUS | Status: DC | PRN

## 2016-11-24 MED ORDER — HYDRALAZINE HCL 10 MG PO TABS
10.0000 mg | ORAL_TABLET | Freq: Three times a day (TID) | ORAL | Status: DC
  Administered 2016-11-24 – 2016-11-25 (×3): 10 mg via ORAL
  Filled 2016-11-24 (×3): qty 1

## 2016-11-24 MED ORDER — SODIUM CHLORIDE 0.9% FLUSH
3.0000 mL | Freq: Two times a day (BID) | INTRAVENOUS | Status: DC
  Administered 2016-11-24 – 2016-11-25 (×2): 3 mL via INTRAVENOUS

## 2016-11-24 MED ORDER — ISOSORBIDE MONONITRATE ER 30 MG PO TB24
15.0000 mg | ORAL_TABLET | Freq: Every day | ORAL | Status: DC
  Administered 2016-11-24: 15 mg via ORAL
  Filled 2016-11-24: qty 1

## 2016-11-24 MED ORDER — ASPIRIN 81 MG PO CHEW
81.0000 mg | CHEWABLE_TABLET | ORAL | Status: AC
  Administered 2016-11-25: 81 mg via ORAL
  Filled 2016-11-24: qty 1

## 2016-11-24 NOTE — Progress Notes (Signed)
Patient ID: [name], male   DOB: [birth year], 51 y.o.   MRN: [id number]   SUBJECTIVE:   Yesterday norepi stopped. Remains on milrinone 0.375 mcg. todays CO-OX is 73%.   Denies SOB/CP.   Creatinine 1.9>1.9  RHC/LHC 1. Normal filling pressures after diuresis, preserved cardiac output on milrinone.  2. 99% ulcerated lesion proximal RCA with left to right collaterals.  95% mid LAD stenosis after mid LAD stent.    Scheduled Meds: . aspirin  81 mg Oral Daily  . atorvastatin  80 mg Oral q1800  . Chlorhexidine Gluconate Cloth  6 each Topical Daily  . clopidogrel  75 mg Oral Daily  . digoxin  0.125 mg Oral Daily  . insulin aspart  0-15 Units Subcutaneous TID WC  . insulin aspart  0-5 Units Subcutaneous QHS  . insulin aspart  3 Units Subcutaneous TID WC  . insulin glargine  30 Units Subcutaneous q morning - 10a  . sodium chloride flush  3 mL Intravenous Q12H  . sodium chloride flush  3 mL Intravenous Q12H   Continuous Infusions: . heparin 1,500 Units/hr (11/24/16 1000)  . milrinone 0.375 mcg/kg/min (11/24/16 1000)   PRN Meds:.sodium chloride, sodium chloride, acetaminophen, ALPRAZolam, alum & mag hydroxide-simeth, ondansetron (ZOFRAN) IV, sodium chloride flush, sodium chloride flush, zolpidem    Vitals:   11/24/16 0758 11/24/16 0800 11/24/16 0900 11/24/16 1000  BP: 113/69 119/73 126/79 123/77  Pulse: 100 99 98 96  Resp: 10 19 (!) 22 18  Temp: 98.2 F (36.8 C)     TempSrc: Oral     SpO2: 98% 96% 98% 96%  Weight:      Height:        Intake/Output Summary (Last 24 hours) at 11/24/16 1010 Last data filed at 11/24/16 1000  Gross per 24 hour  Intake          1919.46 ml  Output             1450 ml  Net           469.46 ml    LABS: Basic Metabolic Panel:  Recent Labs  11/23/16 0430 11/24/16 0455  NA 130* 134*  K 4.5 4.9  CL 88* 93*  CO2 31 30  GLUCOSE 250* 147*  BUN 41* 37*  CREATININE 1.92* 1.92*  CALCIUM 8.9 8.7*  MG 1.9  --    Liver Function  Tests: No results for input(s): AST, ALT, ALKPHOS, BILITOT, PROT, ALBUMIN in the last 72 hours. No results for input(s): LIPASE, AMYLASE in the last 72 hours. CBC:  Recent Labs  11/23/16 0430 11/24/16 0455  WBC 8.6 7.2  HGB 11.5* 11.4*  HCT 35.4* 35.2*  MCV 76.3* 76.4*  PLT 330 340   Cardiac Enzymes: No results for input(s): CKTOTAL, CKMB, CKMBINDEX, TROPONINI in the last 72 hours. BNP: Invalid input(s): POCBNP D-Dimer: No results for input(s): DDIMER in the last 72 hours. Hemoglobin A1C: No results for input(s): HGBA1C in the last 72 hours. Fasting Lipid Panel: No results for input(s): CHOL, HDL, LDLCALC, TRIG, CHOLHDL, LDLDIRECT in the last 72 hours. Thyroid Function Tests: No results for input(s): TSH, T4TOTAL, T3FREE, THYROIDAB in the last 72 hours.  Invalid input(s): FREET3 Anemia Panel: No results for input(s): VITAMINB12, FOLATE, FERRITIN, TIBC, IRON, RETICCTPCT in the last 72 hours.  RADIOLOGY: Dg Chest 2 View  Result Date: 11/09/2016 CLINICAL DATA:  Fatigue. Dyspnea on exertion and lower abdominal pain. EXAM: CHEST  2 VIEW COMPARISON:  08/20/2016 FINDINGS: There is a left  chest wall pacer device with lead in the right atrial appendage and right ventricle. The heart size is enlarged. There is asymmetric elevation of the right hemidiaphragm. There is a moderate right pleural effusion which appears new from previous exam. Pulmonary vascular congestion is identified. IMPRESSION: 1. Moderate right pleural effusion and pulmonary vascular congestion. 2. Cardiac enlargement. Electronically Signed   By: Kerby Moors M.D.   On: 11/09/2016 16:35    PHYSICAL EXAM CVP 5 General: NAD. In bed.  Neck: JVP 5-6cm. No thyromegaly or thyroid nodule.  Lungs: CTAB. On room air. CV: Lateral PMI.  Heart regular S1/S2, +S3, no murmur. No edema.     Abdomen: Soft, nontender, no hepatosplenomegaly, mild distention.  Neurologic: Alert and oriented x 3.  Psych: Normal affect. Extremities:  No clubbing or cyanosis. Warm  TELEMETRY: Reviewed telemetry pt in NSR 90-101  ASSESSMENT AND PLAN: 51 yo with history of CAD s/p anterior MI in 2010 and chronic systolic CHF (presumed to be ischemic cardiomyopathy) was admitted with about 10 days of increased dyspnea and nausea, found to have low output HF.  1. Acute on chronic systolic CHF: Echo 84/69 with EF 25-30%, ischemic cardiomyopathy.  He was admitted with marked volume overload.  He became hypotensive 3/18 with SBP in 80s, poor UOP.  Co-ox sent, was 36.5% initially with CVP 20+.  Started on norepinephrine + milrinone.  Norepi stopped 3/21. Stable off norepi. Todays CO-OX 73%, CVP 5.  - Can decrease milrinone to 0.25 today.  Add hydralazine 10 mg tid + Imdur 15 mg daily with stable BP. - Continue digoxin, check level in am.  - Hold off on diuretic today with CVP 5 and plan for PCI tomorrow.   2. AKI: Suspect cardiorenal syndrome with low output and hypotension as cause.  Creatinine trending down to from 2.69 peak to 1.9.  Will need to be careful with cath tomorrow, limit contrast.  Will give very gentle IV fluid prior to cath if CVP remains low (5 today).  3. CAD: NSTEMI.  ECG is somewhat different with increased T wave inversions in lateral leads compared to prior ECGs.  He had chest pain 3/17 at night.  It is possible that this is demand ischemia in setting of cardiogenic shock and marked volume overload but he merits coronary angiography with troponin up to 8.  LHC with 99% ulcerated lesion proximal RCA with left to right collaterals, 95% mid LAD stenosis after mid LAD stent.  Patient was admitted with cardiogenic shock, now stabilized on milrinone.  EF very low.  CABG would be very high risk.  Plan at this time for PCI to RCA and LAD on Friday with Dr Ellyn Hack.  On plavix, loaded on 3/21.   - Cover with heparin gtt while INR < 2 (warfarin now held).   - Will give ASA and high dose statin.  - He will need to be on Plavix + warfarin  eventually.  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  5. DM2: covering with sliding scale.   Amy Clegg NP-C  11/24/2016 10:10 AM   Patient seen with NP, agree with the above note.  He is stable today, co-ox 73% with CVP 5.  No chest pain or dyspnea.  - Ambulate.  - Decrease milrinone to 0.25 daily, add hydralazine/nitrates as above.  - CVP 5, no diuretic today with cath tomorrow.  Very gentle hydration pre-cath if CVP remains low.  - Plan for LAD  and RCA PCI tomorrow with Dr. Ellyn Hack.  - Now on ASA + Plavix, will eventually need to restart warfarin.  Would limit triple therapy time.   Ayaansh Smail 11/24/2016 11:32 AM

## 2016-11-24 NOTE — Progress Notes (Signed)
ANTICOAGULATION CONSULT NOTE - Follow Up Consult  Pharmacy Consult for restart heparin Indication: history of DVT  Allergies  Allergen Reactions  . Metformin And Related Diarrhea    Patient Measurements: Height: 5' 8"  (172.7 cm) Weight: 182 lb 1.6 oz (82.6 kg) IBW/kg (Calculated) : 68.4 Heparin Dosing Weight: 88 kg  Vital Signs: Temp: 98.2 F (36.8 C) (03/22 0758) Temp Source: Oral (03/22 0758) BP: 113/69 (03/22 0758) Pulse Rate: 100 (03/22 0758)  Labs:  Recent Labs  11/22/16 0500  11/22/16 1739 11/23/16 0430 11/24/16 0455  HGB  --   --  11.2* 11.5* 11.4*  HCT  --   --  34.5* 35.4* 35.2*  PLT  --   --  330 330 340  LABPROT 17.0*  --  15.1 15.0 15.1  INR 1.37  --  1.19 1.17 1.19  HEPARINUNFRC 0.59  --   --  0.52 0.54  CREATININE  --   < > 1.98* 1.92* 1.92*  < > = values in this interval not displayed.  Estimated Creatinine Clearance: 47.2 mL/min (A) (by C-G formula based on SCr of 1.92 mg/dL (H)).   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 Warfarin PTA held and pharmacy consulted to bridge with heparin while INR <2 for cath. Patient now s/p cath and pharmacy consulted to restart heparin 8 hours post sheath removal. Heparin level is therapeutic today, and was therapeutic on the same dose prior-cath. CBC stable. No bleeding noted per patient.  Goal of Therapy:  Heparin level 0.3-0.7 units/ml Monitor platelets by anticoagulation protocol: Yes   Plan:  Continue heparin at 1500 units/hr  Follow up heparin levels and CBC daily  Monitor sings of bleeding   Jeananne Rama, PharmD Candidate 11/24/2016,8:36 AM

## 2016-11-25 ENCOUNTER — Other Ambulatory Visit: Payer: Self-pay

## 2016-11-25 ENCOUNTER — Encounter (HOSPITAL_COMMUNITY)
Admission: AD | Disposition: A | Discharge status: Home / Self Care | Payer: Self-pay | Source: Ambulatory Visit | Attending: Cardiology

## 2016-11-25 DIAGNOSIS — I25110 Atherosclerotic heart disease of native coronary artery with unstable angina pectoris: Secondary | ICD-10-CM

## 2016-11-25 HISTORY — PX: CORONARY STENT INTERVENTION: CATH118234

## 2016-11-25 LAB — BASIC METABOLIC PANEL WITH GFR
Anion gap: 8 (ref 5–15)
BUN: 34 mg/dL — ABNORMAL HIGH (ref 6–20)
CO2: 26 mmol/L (ref 22–32)
Calcium: 8.6 mg/dL — ABNORMAL LOW (ref 8.9–10.3)
Chloride: 100 mmol/L — ABNORMAL LOW (ref 101–111)
Creatinine, Ser: 1.69 mg/dL — ABNORMAL HIGH (ref 0.61–1.24)
GFR calc Af Amer: 52 mL/min — ABNORMAL LOW
GFR calc non Af Amer: 45 mL/min — ABNORMAL LOW
Glucose, Bld: 178 mg/dL — ABNORMAL HIGH (ref 65–99)
Potassium: 4.8 mmol/L (ref 3.5–5.1)
Sodium: 134 mmol/L — ABNORMAL LOW (ref 135–145)

## 2016-11-25 LAB — DIGOXIN LEVEL: Digoxin Level: <0.2 ng/mL — ABNORMAL LOW (ref 0.8–2.0)

## 2016-11-25 LAB — POCT ACTIVATED CLOTTING TIME
ACTIVATED CLOTTING TIME: 307 s
Activated Clotting Time: 263 seconds
Activated Clotting Time: 285 seconds

## 2016-11-25 LAB — COOXEMETRY PANEL
CARBOXYHEMOGLOBIN: 1.1 % (ref 0.5–1.5)
METHEMOGLOBIN: 1.1 % (ref 0.0–1.5)
O2 Saturation: 72.6 %
Total hemoglobin: 11.7 g/dL — ABNORMAL LOW (ref 12.0–16.0)

## 2016-11-25 LAB — PROTIME-INR
INR: 1.17
Prothrombin Time: 14.9 s (ref 11.4–15.2)

## 2016-11-25 LAB — CBC
HEMATOCRIT: 34.6 % — AB (ref 39.0–52.0)
Hemoglobin: 11 g/dL — ABNORMAL LOW (ref 13.0–17.0)
MCH: 24.4 pg — ABNORMAL LOW (ref 26.0–34.0)
MCHC: 31.8 g/dL (ref 30.0–36.0)
MCV: 76.7 fL — ABNORMAL LOW (ref 78.0–100.0)
PLATELETS: 325 10*3/uL (ref 150–400)
RBC: 4.51 MIL/uL (ref 4.22–5.81)
RDW: 16.1 % — AB (ref 11.5–15.5)
WBC: 7.1 10*3/uL (ref 4.0–10.5)

## 2016-11-25 LAB — GLUCOSE, CAPILLARY
GLUCOSE-CAPILLARY: 116 mg/dL — AB (ref 65–99)
Glucose-Capillary: 145 mg/dL — ABNORMAL HIGH (ref 65–99)
Glucose-Capillary: 119 mg/dL — ABNORMAL HIGH (ref 65–99)
Glucose-Capillary: 126 mg/dL — ABNORMAL HIGH (ref 65–99)
Glucose-Capillary: 236 mg/dL — ABNORMAL HIGH (ref 65–99)

## 2016-11-25 LAB — HEPARIN LEVEL (UNFRACTIONATED): Heparin Unfractionated: 1.03 [IU]/mL — ABNORMAL HIGH (ref 0.30–0.70)

## 2016-11-25 SURGERY — CORONARY STENT INTERVENTION
Anesthesia: LOCAL

## 2016-11-25 MED ORDER — LIDOCAINE HCL (PF) 1 % IJ SOLN
INTRAMUSCULAR | Status: AC
  Filled 2016-11-25: qty 30

## 2016-11-25 MED ORDER — SODIUM CHLORIDE 0.9% FLUSH: 3.0000 mL | Freq: Two times a day (BID) | INTRAVENOUS | Status: DC

## 2016-11-25 MED ORDER — NITROGLYCERIN 1 MG/10 ML FOR IR/CATH LAB
INTRA_ARTERIAL | Status: DC | PRN
  Administered 2016-11-25: 100 ug
  Administered 2016-11-25: 200 ug via INTRACORONARY

## 2016-11-25 MED ORDER — MIDAZOLAM HCL 2 MG/2ML IJ SOLN
INTRAMUSCULAR | Status: DC | PRN
  Administered 2016-11-25 (×2): 1 mg via INTRAVENOUS

## 2016-11-25 MED ORDER — HEPARIN (PORCINE) IN NACL 2-0.9 UNIT/ML-% IJ SOLN
INTRAMUSCULAR | Status: DC | PRN
  Administered 2016-11-25: 10 mL via INTRA_ARTERIAL

## 2016-11-25 MED ORDER — LIDOCAINE HCL (PF) 1 % IJ SOLN
INTRAMUSCULAR | Status: DC | PRN
  Administered 2016-11-25: 3 mL

## 2016-11-25 MED ORDER — MIDAZOLAM HCL 2 MG/2ML IJ SOLN
INTRAMUSCULAR | Status: AC
  Filled 2016-11-25: qty 2

## 2016-11-25 MED ORDER — NITROGLYCERIN 1 MG/10 ML FOR IR/CATH LAB
INTRA_ARTERIAL | Status: AC
  Filled 2016-11-25: qty 10

## 2016-11-25 MED ORDER — FENTANYL CITRATE (PF) 100 MCG/2ML IJ SOLN
INTRAMUSCULAR | Status: AC
  Filled 2016-11-25: qty 2

## 2016-11-25 MED ORDER — IOPAMIDOL (ISOVUE-370) INJECTION 76%
INTRAVENOUS | Status: AC
  Filled 2016-11-25: qty 50

## 2016-11-25 MED ORDER — SODIUM CHLORIDE 0.9 % IV SOLN
INTRAVENOUS | Status: AC
  Administered 2016-11-25: 16:00:00 via INTRAVENOUS

## 2016-11-25 MED ORDER — WARFARIN - PHARMACIST DOSING INPATIENT: Freq: Every day | Status: DC

## 2016-11-25 MED ORDER — SODIUM CHLORIDE 0.9% FLUSH: 3.0000 mL | INTRAVENOUS | Status: DC | PRN

## 2016-11-25 MED ORDER — IOPAMIDOL (ISOVUE-370) INJECTION 76%
INTRAVENOUS | Status: AC
  Filled 2016-11-25: qty 125

## 2016-11-25 MED ORDER — HEPARIN SODIUM (PORCINE) 1000 UNIT/ML IJ SOLN
INTRAMUSCULAR | Status: AC
  Filled 2016-11-25: qty 1

## 2016-11-25 MED ORDER — ISOSORBIDE MONONITRATE ER 30 MG PO TB24
30.0000 mg | ORAL_TABLET | Freq: Every day | ORAL | Status: DC
  Administered 2016-11-25 – 2016-11-27 (×3): 30 mg via ORAL
  Filled 2016-11-25 (×3): qty 1

## 2016-11-25 MED ORDER — SODIUM CHLORIDE 0.9 % IV SOLN: 250.0000 mL | INTRAVENOUS | Status: DC | PRN

## 2016-11-25 MED ORDER — HEPARIN (PORCINE) IN NACL 2-0.9 UNIT/ML-% IJ SOLN
INTRAMUSCULAR | Status: DC | PRN
  Administered 2016-11-25: 14:00:00

## 2016-11-25 MED ORDER — HEPARIN SODIUM (PORCINE) 1000 UNIT/ML IJ SOLN
INTRAMUSCULAR | Status: DC | PRN
  Administered 2016-11-25: 3000 [IU] via INTRAVENOUS
  Administered 2016-11-25: 7000 [IU] via INTRAVENOUS

## 2016-11-25 MED ORDER — VERAPAMIL HCL 2.5 MG/ML IV SOLN
INTRAVENOUS | Status: AC
  Filled 2016-11-25: qty 2

## 2016-11-25 MED ORDER — HYDRALAZINE HCL 20 MG/ML IJ SOLN: 5.0000 mg | INTRAMUSCULAR | Status: AC | PRN

## 2016-11-25 MED ORDER — SODIUM CHLORIDE 0.9 % IV SOLN
INTRAVENOUS | Status: DC | PRN
  Administered 2016-11-25: 10 mL/h via INTRAVENOUS

## 2016-11-25 MED ORDER — LABETALOL HCL 5 MG/ML IV SOLN: 10.0000 mg | INTRAVENOUS | Status: AC | PRN

## 2016-11-25 MED ORDER — FENTANYL CITRATE (PF) 100 MCG/2ML IJ SOLN
INTRAMUSCULAR | Status: DC | PRN
  Administered 2016-11-25 (×2): 25 ug via INTRAVENOUS

## 2016-11-25 MED ORDER — HEPARIN (PORCINE) IN NACL 100-0.45 UNIT/ML-% IJ SOLN
1300.0000 [IU]/h | INTRAMUSCULAR | Status: DC
  Administered 2016-11-25: 1300 [IU]/h via INTRAVENOUS

## 2016-11-25 MED ORDER — HEPARIN SODIUM (PORCINE) 5000 UNIT/ML IJ SOLN
5000.0000 [IU] | Freq: Three times a day (TID) | INTRAMUSCULAR | Status: DC
  Administered 2016-11-25 – 2016-11-27 (×7): 5000 [IU] via SUBCUTANEOUS
  Filled 2016-11-25 (×7): qty 1

## 2016-11-25 MED ORDER — HYDRALAZINE HCL 25 MG PO TABS
25.0000 mg | ORAL_TABLET | Freq: Three times a day (TID) | ORAL | Status: DC
  Administered 2016-11-25 – 2016-11-27 (×7): 25 mg via ORAL
  Filled 2016-11-25 (×7): qty 1

## 2016-11-25 MED ORDER — WARFARIN SODIUM 7.5 MG PO TABS
7.5000 mg | ORAL_TABLET | Freq: Once | ORAL | Status: AC
  Administered 2016-11-25: 7.5 mg via ORAL
  Filled 2016-11-25: qty 1

## 2016-11-25 MED ORDER — HEPARIN (PORCINE) IN NACL 2-0.9 UNIT/ML-% IJ SOLN
INTRAMUSCULAR | Status: AC
  Filled 2016-11-25: qty 1000

## 2016-11-25 SURGICAL SUPPLY — 21 items
BALLN MOZEC 2.50X14 (BALLOONS) ×2
BALLN ~~LOC~~ MOZEC 3.0X15 (BALLOONS) ×2
BALLN ~~LOC~~ MOZEC 3.5X15 (BALLOONS) ×2
BALLOON MOZEC 2.50X14 (BALLOONS) IMPLANT
BALLOON ~~LOC~~ MOZEC 3.0X15 (BALLOONS) IMPLANT
BALLOON ~~LOC~~ MOZEC 3.5X15 (BALLOONS) IMPLANT
CATH VISTA GUIDE 6FR XBLAD3.5 (CATHETERS) ×1 IMPLANT
COVER PRB 48X5XTLSCP FOLD TPE (BAG) IMPLANT
COVER PROBE 5X48 (BAG) ×2
DEVICE RAD COMP TR BAND LRG (VASCULAR PRODUCTS) ×1 IMPLANT
GLIDESHEATH SLEND A-KIT 6F 22G (SHEATH) ×1 IMPLANT
GUIDEWIRE INQWIRE 1.5J.035X260 (WIRE) IMPLANT
INQWIRE 1.5J .035X260CM (WIRE) ×2
KIT ENCORE 26 ADVANTAGE (KITS) ×2 IMPLANT
KIT HEART LEFT (KITS) ×2 IMPLANT
PACK CARDIAC CATHETERIZATION (CUSTOM PROCEDURE TRAY) ×2 IMPLANT
STENT SYNERGY DES 2.75X28 (Permanent Stent) ×1 IMPLANT
STENT SYNERGY DES 3X32 (Permanent Stent) ×1 IMPLANT
TRANSDUCER W/STOPCOCK (MISCELLANEOUS) ×2 IMPLANT
TUBING CIL FLEX 10 FLL-RA (TUBING) ×2 IMPLANT
WIRE MARVEL STR TIP 190CM (WIRE) ×1 IMPLANT

## 2016-11-25 NOTE — Progress Notes (Signed)
ANTICOAGULATION CONSULT NOTE - Follow Up Consult  Pharmacy Consult for restart coumadin Indication: history of DVT  Allergies  Allergen Reactions  . Metformin And Related Diarrhea    Patient Measurements: Height: 5' 8"  (172.7 cm) Weight: 185 lb 3 oz (84 kg) IBW/kg (Calculated) : 68.4 Heparin Dosing Weight: 88 kg  Vital Signs: Temp: 98.1 F (36.7 C) (03/23 1100) Temp Source: Oral (03/23 1100) BP: 173/89 (03/23 1515) Pulse Rate: 88 (03/23 1515)  Labs:  Recent Labs  11/23/16 0430 11/24/16 0455 11/25/16 0430  HGB 11.5* 11.4* 11.0*  HCT 35.4* 35.2* 34.6*  PLT 330 340 325  LABPROT 15.0 15.1 14.9  INR 1.17 1.19 1.17  HEPARINUNFRC 0.52 0.54 1.03*  CREATININE 1.92* 1.92* 1.69*    Estimated Creatinine Clearance: 54 mL/min (A) (by C-G formula based on SCr of 1.69 mg/dL (H)).  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 Warfarin PTA held and pharmacy consulted to bridge with heparin while INR <2 for cath.   Patient now post cath. Per discussion with MD no plans to restart heparin drip, sq heparin ordered and warfarin to restart tonight.   INR 1.1 this am.   Goal of Therapy:  INR goal 2-3 Monitor platelets by anticoagulation protocol: Yes   Plan: Resume warfarin tonight - 7.78m tonight Daily INR for now, stop sq heparin when INR>2  FErin HearingPharmD., BCPS Clinical Pharmacist Pager [phone]/23/2018 3:36 PM

## 2016-11-25 NOTE — Progress Notes (Signed)
ANTICOAGULATION CONSULT NOTE - Follow Up Consult  Pharmacy Consult for restart heparin Indication: history of DVT  Allergies  Allergen Reactions  . Metformin And Related Diarrhea    Patient Measurements: Height: 5' 8"  (172.7 cm) Weight: 182 lb 1.6 oz (82.6 kg) IBW/kg (Calculated) : 68.4 Heparin Dosing Weight: 88 kg  Vital Signs: Temp: 98.2 F (36.8 C) (03/23 0450) Temp Source: Oral (03/23 0450) BP: 133/82 (03/23 0000) Pulse Rate: 101 (03/23 0000)  Labs:  Recent Labs  11/22/16 1739 11/23/16 0430 11/24/16 0455 11/25/16 0430  HGB 11.2* 11.5* 11.4*  --   HCT 34.5* 35.4* 35.2*  --   PLT 330 330 340  --   LABPROT 15.1 15.0 15.1  --   INR 1.19 1.17 1.19  --   HEPARINUNFRC  --  0.52 0.54 1.03*  CREATININE 1.98* 1.92* 1.92*  --     Estimated Creatinine Clearance: 47.2 mL/min (A) (by C-G formula based on SCr of 1.92 mg/dL (H)).  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 Warfarin PTA held and pharmacy consulted to bridge with heparin while INR <2 for cath. Patient now s/p cath and heparin restarted 8 hours post sheath removal. Heparin level now up to supratherapeutic (1.03) on gtt at 1500 units/hr. Heparin level drawn appropriately (from arm opposite heparin infusing). No bleeding noted.  Goal of Therapy:  Heparin level 0.3-0.7 units/ml Monitor platelets by anticoagulation protocol: Yes   Plan:  Hold heparin x 1 hour Decrease heparin to 1300 units/hr when restarted F/u 6 hr heparin level after restart  Sherlon Handing, PharmD, BCPS Clinical pharmacist, pager [phone] 11/25/2016,5:00 AM

## 2016-11-25 NOTE — Progress Notes (Addendum)
Patient ID: [name], male   DOB: 10-29-1965, 51 y.o.   MRN: [id number]   SUBJECTIVE:   Patient is now off norepinephrine, milrinone decreased to 0.25.  Creatinine down to 1.69.  Co-ox 73% with CVP 7.   Denies SOB/CP.   Creatinine 1.9>1.9>1.69  RHC/LHC 1. Normal filling pressures after diuresis, preserved cardiac output on milrinone.  2. 99% ulcerated lesion proximal RCA with left to right collaterals.  95% mid LAD stenosis after mid LAD stent.    Scheduled Meds: . [START ON 11/26/2016] aspirin  81 mg Oral Daily  . atorvastatin  80 mg Oral q1800  . Chlorhexidine Gluconate Cloth  6 each Topical Daily  . [START ON 11/26/2016] clopidogrel  75 mg Oral Daily  . digoxin  0.125 mg Oral Daily  . hydrALAZINE  25 mg Oral Q8H  . insulin aspart  0-15 Units Subcutaneous TID WC  . insulin aspart  0-5 Units Subcutaneous QHS  . insulin aspart  3 Units Subcutaneous TID WC  . insulin glargine  30 Units Subcutaneous q morning - 10a  . isosorbide mononitrate  30 mg Oral Daily  . sodium chloride flush  3 mL Intravenous Q12H  . sodium chloride flush  3 mL Intravenous Q12H  . sodium chloride flush  3 mL Intravenous Q12H   Continuous Infusions: . sodium chloride 50 mL/hr at 11/25/16 0800  . heparin 1,300 Units/hr (11/25/16 0800)  . milrinone 0.25 mcg/kg/min (11/25/16 0800)   PRN Meds:.sodium chloride, sodium chloride, sodium chloride, acetaminophen, ALPRAZolam, alum & mag hydroxide-simeth, ondansetron (ZOFRAN) IV, sodium chloride flush, sodium chloride flush, sodium chloride flush, zolpidem    Vitals:   11/25/16 0450 11/25/16 0500 11/25/16 0700 11/25/16 0800  BP:   130/82   Pulse:  90 95 96  Resp:  14 17 16   Temp: 98.2 F (36.8 C)  98 F (36.7 C)   TempSrc: Oral  Oral   SpO2:  98% 98% 98%  Weight:  185 lb 3 oz (84 kg)    Height:        Intake/Output Summary (Last 24 hours) at 11/25/16 0934 Last data filed at 11/25/16 0800  Gross per 24 hour  Intake           964.58 ml  Output               600 ml  Net           364.58 ml    LABS: Basic Metabolic Panel:  Recent Labs  11/23/16 0430 11/24/16 0455 11/25/16 0430  NA 130* 134* 134*  K 4.5 4.9 4.8  CL 88* 93* 100*  CO2 31 30 26   GLUCOSE 250* 147* 178*  BUN 41* 37* 34*  CREATININE 1.92* 1.92* 1.69*  CALCIUM 8.9 8.7* 8.6*  MG 1.9  --   --    Liver Function Tests: No results for input(s): AST, ALT, ALKPHOS, BILITOT, PROT, ALBUMIN in the last 72 hours. No results for input(s): LIPASE, AMYLASE in the last 72 hours. CBC:  Recent Labs  11/24/16 0455 11/25/16 0430  WBC 7.2 7.1  HGB 11.4* 11.0*  HCT 35.2* 34.6*  MCV 76.4* 76.7*  PLT 340 325   Cardiac Enzymes: No results for input(s): CKTOTAL, CKMB, CKMBINDEX, TROPONINI in the last 72 hours. BNP: Invalid input(s): POCBNP D-Dimer: No results for input(s): DDIMER in the last 72 hours. Hemoglobin A1C: No results for input(s): HGBA1C in the last 72 hours. Fasting Lipid Panel: No results for input(s): CHOL, HDL, LDLCALC, TRIG, CHOLHDL,  LDLDIRECT in the last 72 hours. Thyroid Function Tests: No results for input(s): TSH, T4TOTAL, T3FREE, THYROIDAB in the last 72 hours.  Invalid input(s): FREET3 Anemia Panel: No results for input(s): VITAMINB12, FOLATE, FERRITIN, TIBC, IRON, RETICCTPCT in the last 72 hours.  RADIOLOGY: Dg Chest 2 View  Result Date: 11/09/2016 CLINICAL DATA:  Fatigue. Dyspnea on exertion and lower abdominal pain. EXAM: CHEST  2 VIEW COMPARISON:  08/20/2016 FINDINGS: There is a left chest wall pacer device with lead in the right atrial appendage and right ventricle. The heart size is enlarged. There is asymmetric elevation of the right hemidiaphragm. There is a moderate right pleural effusion which appears new from previous exam. Pulmonary vascular congestion is identified. IMPRESSION: 1. Moderate right pleural effusion and pulmonary vascular congestion. 2. Cardiac enlargement. Electronically Signed   By: Kerby Moors M.D.   On: 11/09/2016  16:35    PHYSICAL EXAM CVP 7 General: NAD. In bed.  Neck: JVP 7-8 cm. No thyromegaly or thyroid nodule.  Lungs: CTAB. On room air. CV: Lateral PMI.  Heart regular S1/S2, no S3/S4, no murmur. No edema.     Abdomen: Soft, nontender, no hepatosplenomegaly, mild distention.  Neurologic: Alert and oriented x 3.  Psych: Normal affect. Extremities: No clubbing or cyanosis. Warm  TELEMETRY: Reviewed telemetry pt in NSR 90s  ASSESSMENT AND PLAN: 51 yo with history of CAD s/p anterior MI in 2010 and chronic systolic CHF (presumed to be ischemic cardiomyopathy) was admitted with about 10 days of increased dyspnea and nausea, found to have low output HF.  1. Acute on chronic systolic CHF: Echo 80/88 with EF 25-30%, ischemic cardiomyopathy.  He was admitted with marked volume overload.  He became hypotensive 3/18 with SBP in 80s, poor UOP.  Co-ox sent, was 36.5% initially with CVP 20+.  Started on norepinephrine + milrinone.  Norepi stopped 3/21. Now weaning milrinone, down to 0.25 today. Today's CO-OX 73%, CVP 7.  - Would continue milrinone at 0.25 today given plan for PCI, continue to wean tomorrow. - Continue digoxin, level not elevated.  - Increase hydralazine to 25 tid and Imdur to 30.  Will increase afterload reduction as we wean milrinone.  - Add spironolactone tomorrow if creatinine stable.  - Hold off on diuretic today with CVP 7 and plan for PCI today (creatinine down to 1.69).  Would probably start torsemide 40 mg bid tomorrow.  2. AKI: Suspect cardiorenal syndrome with low output and hypotension as cause.  Creatinine trending down to from 2.69 peak to 1.69.  Will need to be careful with cath today, limit contrast as much as possible.  Giving very gentle IV fluid prior to cath as CVP is not elevated (7).  3. CAD: NSTEMI.  ECG is somewhat different with increased T wave inversions in lateral leads compared to prior ECGs.  He had chest pain 3/17 at night.  It is possible that this is demand  ischemia in setting of cardiogenic shock and marked volume overload but he merits coronary angiography with troponin up to 8.  LHC with 99% ulcerated lesion proximal RCA with left to right collaterals, 95% mid LAD stenosis after mid LAD stent.  Patient was admitted with cardiogenic shock, now stabilized on milrinone.  EF very low.  CABG would be very high risk.  Plan at this time for PCI to RCA and LAD today with Dr Ellyn Hack.  On plavix, loaded on 3/21.  Patient understands risks/benefits of procedure.  - Cover with heparin gtt while INR < 2 (  warfarin now held, restart after PCI).  - Will give ASA and high dose statin.  - He will need to be on Plavix + warfarin long-term, would drop aspirin 81 soon after PCI.  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  5. DM2: covering with sliding scale.   Trequan Marsolek  11/25/2016 9:34 AM

## 2016-11-25 NOTE — Interval H&P Note (Signed)
History and Physical Interval Note:  11/25/2016 12:40 PM  [name]  has presented today for surgery, with the diagnosis of cad-angina & CHF / NSTEMI The various methods of treatment have been discussed with the patient and family. After consideration of risks, benefits and other options for treatment, the patient has consented to  Procedure(s): Coronary Stent Intervention (N/A) as a surgical intervention .  The patient's history has been reviewed, patient examined, no change in status, stable for surgery.  I have reviewed the patient's chart and labs.  Questions were answered to the patient's satisfaction.   Cath Lab Visit (complete for each Cath Lab visit)  Clinical Evaluation Leading to the Procedure:   ACS: Yes.    Non-ACS:    Anginal Classification: CCS IV  Anti-ischemic medical therapy: Maximal Therapy (2 or more classes of medications)  Non-Invasive Test Results: No non-invasive testing performed  Prior CABG: No previous CABG    Glenetta Hew

## 2016-11-25 NOTE — H&P (View-Only) (Signed)
Patient ID: [name], male   DOB: 1966-02-12, 51 y.o.   MRN: [id number]   SUBJECTIVE:   Patient is now off norepinephrine, milrinone decreased to 0.25.  Creatinine down to 1.69.  Co-ox 73% with CVP 7.   Denies SOB/CP.   Creatinine 1.9>1.9>1.69  RHC/LHC 1. Normal filling pressures after diuresis, preserved cardiac output on milrinone.  2. 99% ulcerated lesion proximal RCA with left to right collaterals.  95% mid LAD stenosis after mid LAD stent.    Scheduled Meds: . [START ON 11/26/2016] aspirin  81 mg Oral Daily  . atorvastatin  80 mg Oral q1800  . Chlorhexidine Gluconate Cloth  6 each Topical Daily  . [START ON 11/26/2016] clopidogrel  75 mg Oral Daily  . digoxin  0.125 mg Oral Daily  . hydrALAZINE  25 mg Oral Q8H  . insulin aspart  0-15 Units Subcutaneous TID WC  . insulin aspart  0-5 Units Subcutaneous QHS  . insulin aspart  3 Units Subcutaneous TID WC  . insulin glargine  30 Units Subcutaneous q morning - 10a  . isosorbide mononitrate  30 mg Oral Daily  . sodium chloride flush  3 mL Intravenous Q12H  . sodium chloride flush  3 mL Intravenous Q12H  . sodium chloride flush  3 mL Intravenous Q12H   Continuous Infusions: . sodium chloride 50 mL/hr at 11/25/16 0800  . heparin 1,300 Units/hr (11/25/16 0800)  . milrinone 0.25 mcg/kg/min (11/25/16 0800)   PRN Meds:.sodium chloride, sodium chloride, sodium chloride, acetaminophen, ALPRAZolam, alum & mag hydroxide-simeth, ondansetron (ZOFRAN) IV, sodium chloride flush, sodium chloride flush, sodium chloride flush, zolpidem    Vitals:   11/25/16 0450 11/25/16 0500 11/25/16 0700 11/25/16 0800  BP:   130/82   Pulse:  90 95 96  Resp:  14 17 16   Temp: 98.2 F (36.8 C)  98 F (36.7 C)   TempSrc: Oral  Oral   SpO2:  98% 98% 98%  Weight:  185 lb 3 oz (84 kg)    Height:        Intake/Output Summary (Last 24 hours) at 11/25/16 0934 Last data filed at 11/25/16 0800  Gross per 24 hour  Intake           964.58 ml  Output               600 ml  Net           364.58 ml    LABS: Basic Metabolic Panel:  Recent Labs  11/23/16 0430 11/24/16 0455 11/25/16 0430  NA 130* 134* 134*  K 4.5 4.9 4.8  CL 88* 93* 100*  CO2 31 30 26   GLUCOSE 250* 147* 178*  BUN 41* 37* 34*  CREATININE 1.92* 1.92* 1.69*  CALCIUM 8.9 8.7* 8.6*  MG 1.9  --   --    Liver Function Tests: No results for input(s): AST, ALT, ALKPHOS, BILITOT, PROT, ALBUMIN in the last 72 hours. No results for input(s): LIPASE, AMYLASE in the last 72 hours. CBC:  Recent Labs  11/24/16 0455 11/25/16 0430  WBC 7.2 7.1  HGB 11.4* 11.0*  HCT 35.2* 34.6*  MCV 76.4* 76.7*  PLT 340 325   Cardiac Enzymes: No results for input(s): CKTOTAL, CKMB, CKMBINDEX, TROPONINI in the last 72 hours. BNP: Invalid input(s): POCBNP D-Dimer: No results for input(s): DDIMER in the last 72 hours. Hemoglobin A1C: No results for input(s): HGBA1C in the last 72 hours. Fasting Lipid Panel: No results for input(s): CHOL, HDL, LDLCALC, TRIG, CHOLHDL,  LDLDIRECT in the last 72 hours. Thyroid Function Tests: No results for input(s): TSH, T4TOTAL, T3FREE, THYROIDAB in the last 72 hours.  Invalid input(s): FREET3 Anemia Panel: No results for input(s): VITAMINB12, FOLATE, FERRITIN, TIBC, IRON, RETICCTPCT in the last 72 hours.  RADIOLOGY: Dg Chest 2 View  Result Date: 11/09/2016 CLINICAL DATA:  Fatigue. Dyspnea on exertion and lower abdominal pain. EXAM: CHEST  2 VIEW COMPARISON:  08/20/2016 FINDINGS: There is a left chest wall pacer device with lead in the right atrial appendage and right ventricle. The heart size is enlarged. There is asymmetric elevation of the right hemidiaphragm. There is a moderate right pleural effusion which appears new from previous exam. Pulmonary vascular congestion is identified. IMPRESSION: 1. Moderate right pleural effusion and pulmonary vascular congestion. 2. Cardiac enlargement. Electronically Signed   By: Kerby Moors M.D.   On: 11/09/2016  16:35    PHYSICAL EXAM CVP 7 General: NAD. In bed.  Neck: JVP 7-8 cm. No thyromegaly or thyroid nodule.  Lungs: CTAB. On room air. CV: Lateral PMI.  Heart regular S1/S2, no S3/S4, no murmur. No edema.     Abdomen: Soft, nontender, no hepatosplenomegaly, mild distention.  Neurologic: Alert and oriented x 3.  Psych: Normal affect. Extremities: No clubbing or cyanosis. Warm  TELEMETRY: Reviewed telemetry pt in NSR 90s  ASSESSMENT AND PLAN: 51 yo with history of CAD s/p anterior MI in 2010 and chronic systolic CHF (presumed to be ischemic cardiomyopathy) was admitted with about 10 days of increased dyspnea and nausea, found to have low output HF.  1. Acute on chronic systolic CHF: Echo 44/01 with EF 25-30%, ischemic cardiomyopathy.  He was admitted with marked volume overload.  He became hypotensive 3/18 with SBP in 80s, poor UOP.  Co-ox sent, was 36.5% initially with CVP 20+.  Started on norepinephrine + milrinone.  Norepi stopped 3/21. Now weaning milrinone, down to 0.25 today. Today's CO-OX 73%, CVP 7.  - Would continue milrinone at 0.25 today given plan for PCI, continue to wean tomorrow. - Continue digoxin, level not elevated.  - Increase hydralazine to 25 tid and Imdur to 30.  Will increase afterload reduction as we wean milrinone.  - Add spironolactone tomorrow if creatinine stable.  - Hold off on diuretic today with CVP 7 and plan for PCI today (creatinine down to 1.69).   2. AKI: Suspect cardiorenal syndrome with low output and hypotension as cause.  Creatinine trending down to from 2.69 peak to 1.69.  Will need to be careful with cath today, limit contrast as much as possible.  Giving very gentle IV fluid prior to cath as CVP is not elevated (7).  3. CAD: NSTEMI.  ECG is somewhat different with increased T wave inversions in lateral leads compared to prior ECGs.  He had chest pain 3/17 at night.  It is possible that this is demand ischemia in setting of cardiogenic shock and marked  volume overload but he merits coronary angiography with troponin up to 8.  LHC with 99% ulcerated lesion proximal RCA with left to right collaterals, 95% mid LAD stenosis after mid LAD stent.  Patient was admitted with cardiogenic shock, now stabilized on milrinone.  EF very low.  CABG would be very high risk.  Plan at this time for PCI to RCA and LAD today with Dr Ellyn Hack.  On plavix, loaded on 3/21.  Patient understands risks/benefits of procedure.  - Cover with heparin gtt while INR < 2 (warfarin now held, restart after PCI).  -  Will give ASA and high dose statin.  - He will need to be on Plavix + warfarin long-term, would drop aspirin 81 soon after PCI.  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  5. DM2: covering with sliding scale.   Caylen Yardley  11/25/2016 9:34 AM

## 2016-11-25 NOTE — Care Management Note (Signed)
Case Management Note  Patient Details  Name: [name] MRN: [id number] Date of Birth: August 30, 1966  Subjective/Objective:    Pt admitted with acute on chronic HF                Action/Plan:   PTA independent from home with adult children - pt denied barriers to returning home.  PT has active relationship with PCP and denied barriers with obtaining medications.  CM will continue to follow for discharge needs   Expected Discharge Date:                  Expected Discharge Plan:  Home/Self Care  In-House Referral:     Discharge planning Services  CM Consult  Post Acute Care Choice:    Choice offered to:     DME Arranged:    DME Agency:     HH Arranged:    HH Agency:     Status of Service:  In process, will continue to follow  If discussed at Long Length of Stay Meetings, dates discussed:    Additional Comments:  Maryclare Labrador, RN 11/25/2016, 10:27 AM

## 2016-11-26 LAB — GLUCOSE, CAPILLARY
GLUCOSE-CAPILLARY: 170 mg/dL — AB (ref 65–99)
GLUCOSE-CAPILLARY: 178 mg/dL — AB (ref 65–99)
Glucose-Capillary: 123 mg/dL — ABNORMAL HIGH (ref 65–99)
Glucose-Capillary: 137 mg/dL — ABNORMAL HIGH (ref 65–99)

## 2016-11-26 LAB — BASIC METABOLIC PANEL
Anion gap: 9 (ref 5–15)
BUN: 27 mg/dL — ABNORMAL HIGH (ref 6–20)
CALCIUM: 8.7 mg/dL — AB (ref 8.9–10.3)
CHLORIDE: 103 mmol/L (ref 101–111)
CO2: 26 mmol/L (ref 22–32)
CREATININE: 1.57 mg/dL — AB (ref 0.61–1.24)
GFR, EST AFRICAN AMERICAN: 57 mL/min — AB (ref >60)
GFR, EST NON AFRICAN AMERICAN: 49 mL/min — AB (ref >60)
Glucose, Bld: 146 mg/dL — ABNORMAL HIGH (ref 65–99)
Potassium: 5.2 mmol/L — ABNORMAL HIGH (ref 3.5–5.1)
SODIUM: 138 mmol/L (ref 135–145)

## 2016-11-26 LAB — CBC
HEMATOCRIT: 33.8 % — AB (ref 39.0–52.0)
Hemoglobin: 10.7 g/dL — ABNORMAL LOW (ref 13.0–17.0)
MCH: 24.7 pg — ABNORMAL LOW (ref 26.0–34.0)
MCHC: 31.7 g/dL (ref 30.0–36.0)
MCV: 77.9 fL — AB (ref 78.0–100.0)
Platelets: 333 10*3/uL (ref 150–400)
RBC: 4.34 MIL/uL (ref 4.22–5.81)
RDW: 16.3 % — ABNORMAL HIGH (ref 11.5–15.5)
WBC: 7.7 10*3/uL (ref 4.0–10.5)

## 2016-11-26 LAB — COOXEMETRY PANEL
Carboxyhemoglobin: 1.7 % — ABNORMAL HIGH (ref 0.5–1.5)
Methemoglobin: 0.9 % (ref 0.0–1.5)
O2 SAT: 75.9 %
Total hemoglobin: 11.4 g/dL — ABNORMAL LOW (ref 12.0–16.0)

## 2016-11-26 LAB — PROTIME-INR
INR: 1.09
Prothrombin Time: 14.1 seconds (ref 11.4–15.2)

## 2016-11-26 MED ORDER — WARFARIN SODIUM 10 MG PO TABS
10.0000 mg | ORAL_TABLET | Freq: Once | ORAL | Status: AC
  Administered 2016-11-26: 10 mg via ORAL
  Filled 2016-11-26: qty 1

## 2016-11-26 NOTE — Progress Notes (Signed)
ANTICOAGULATION CONSULT NOTE - Follow Up Consult  Pharmacy Consult for restart coumadin Indication: history of DVT  Allergies  Allergen Reactions  . Metformin And Related Diarrhea    Patient Measurements: Height: 5' 8"  (172.7 cm) Weight: 187 lb 2.7 oz (84.9 kg) IBW/kg (Calculated) : 68.4 Heparin Dosing Weight: 88 kg  Vital Signs: Temp: 98.3 F (36.8 C) (03/24 0300) Temp Source: Oral (03/24 0300) BP: 133/72 (03/24 0300) Pulse Rate: 97 (03/24 0300)  Labs:  Recent Labs  11/24/16 0455 11/25/16 0430 11/26/16 0414  HGB 11.4* 11.0* 10.7*  HCT 35.2* 34.6* 33.8*  PLT 340 325 333  LABPROT 15.1 14.9 14.1  INR 1.19 1.17 1.09  HEPARINUNFRC 0.54 1.03*  --   CREATININE 1.92* 1.69* 1.57*    Estimated Creatinine Clearance: 58.4 mL/min (A) (by C-G formula based on SCr of 1.57 mg/dL (H)).  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 Warfarin PTA held for cath  Patient now post cath. Heparin not restarted post cath, warfarin restarted last night. INR still low at 1.0. CBC stable overnight.     Goal of Therapy:  INR goal 2-3 Monitor platelets by anticoagulation protocol: Yes   Plan: Warfarin tonight - 67m once tonight then back down on dosing Daily INR for now, stop sq heparin when INR>2  FErin HearingPharmD., BCPS Clinical Pharmacist Pager [phone]/24/2018 7:39 AM

## 2016-11-26 NOTE — Progress Notes (Signed)
Patient ID: [name], male   DOB: 1966-07-08, 51 y.o.   MRN: [id number]   SUBJECTIVE:    Underwent stenting of mLAD and prox RCA on 3/23. Feels good. No CP.   Milrinone cut down to 0.125 yesterday and hydralazine increased. Feels good. Breathing well. Walked halls with CR.   CVP 3-4 (checked personally). SBP 150-160  Co-ox 76%  Creatinine 1.9>1.9>1.69> 1.57  K 5.2  RHC/LHC 1. Normal filling pressures after diuresis, preserved cardiac output on milrinone.  2. 99% ulcerated lesion proximal RCA with left to right collaterals.  95% mid LAD stenosis after mid LAD stent.    Scheduled Meds: . aspirin  81 mg Oral Daily  . atorvastatin  80 mg Oral q1800  . Chlorhexidine Gluconate Cloth  6 each Topical Daily  . clopidogrel  75 mg Oral Daily  . digoxin  0.125 mg Oral Daily  . heparin  5,000 Units Subcutaneous Q8H  . hydrALAZINE  25 mg Oral Q8H  . insulin aspart  0-15 Units Subcutaneous TID WC  . insulin aspart  0-5 Units Subcutaneous QHS  . insulin aspart  3 Units Subcutaneous TID WC  . insulin glargine  30 Units Subcutaneous q morning - 10a  . isosorbide mononitrate  30 mg Oral Daily  . sodium chloride flush  3 mL Intravenous Q12H  . sodium chloride flush  3 mL Intravenous Q12H  . sodium chloride flush  3 mL Intravenous Q12H  . warfarin  10 mg Oral ONCE-1800  . Warfarin - Pharmacist Dosing Inpatient   Does not apply q1800   Continuous Infusions: . milrinone 0.125 mcg/kg/min (11/26/16 0800)   PRN Meds:.sodium chloride, sodium chloride, sodium chloride, acetaminophen, ALPRAZolam, alum & mag hydroxide-simeth, ondansetron (ZOFRAN) IV, sodium chloride flush, sodium chloride flush, sodium chloride flush, zolpidem    Vitals:   11/26/16 0300 11/26/16 0700 11/26/16 0741 11/26/16 1138  BP: 133/72 (!) 149/78  136/77  Pulse: 97 94  96  Resp: 19 15  18   Temp: 98.3 F (36.8 C)  97.6 F (36.4 C) 98.4 F (36.9 C)  TempSrc: Oral  Oral Oral  SpO2: 98% 99%  100%  Weight: 84.9 kg  (187 lb 2.7 oz)     Height:        Intake/Output Summary (Last 24 hours) at 11/26/16 1414 Last data filed at 11/26/16 1400  Gross per 24 hour  Intake           1605.4 ml  Output             1175 ml  Net            430.4 ml    LABS: Basic Metabolic Panel:  Recent Labs  11/25/16 0430 11/26/16 0414  NA 134* 138  K 4.8 5.2*  CL 100* 103  CO2 26 26  GLUCOSE 178* 146*  BUN 34* 27*  CREATININE 1.69* 1.57*  CALCIUM 8.6* 8.7*   Liver Function Tests: No results for input(s): AST, ALT, ALKPHOS, BILITOT, PROT, ALBUMIN in the last 72 hours. No results for input(s): LIPASE, AMYLASE in the last 72 hours. CBC:  Recent Labs  11/25/16 0430 11/26/16 0414  WBC 7.1 7.7  HGB 11.0* 10.7*  HCT 34.6* 33.8*  MCV 76.7* 77.9*  PLT 325 333   Cardiac Enzymes: No results for input(s): CKTOTAL, CKMB, CKMBINDEX, TROPONINI in the last 72 hours. BNP: Invalid input(s): POCBNP D-Dimer: No results for input(s): DDIMER in the last 72 hours. Hemoglobin A1C: No results for input(s): HGBA1C in the  last 72 hours. Fasting Lipid Panel: No results for input(s): CHOL, HDL, LDLCALC, TRIG, CHOLHDL, LDLDIRECT in the last 72 hours. Thyroid Function Tests: No results for input(s): TSH, T4TOTAL, T3FREE, THYROIDAB in the last 72 hours.  Invalid input(s): FREET3 Anemia Panel: No results for input(s): VITAMINB12, FOLATE, FERRITIN, TIBC, IRON, RETICCTPCT in the last 72 hours.  RADIOLOGY: Dg Chest 2 View  Result Date: 11/09/2016 CLINICAL DATA:  Fatigue. Dyspnea on exertion and lower abdominal pain. EXAM: CHEST  2 VIEW COMPARISON:  08/20/2016 FINDINGS: There is a left chest wall pacer device with lead in the right atrial appendage and right ventricle. The heart size is enlarged. There is asymmetric elevation of the right hemidiaphragm. There is a moderate right pleural effusion which appears new from previous exam. Pulmonary vascular congestion is identified. IMPRESSION: 1. Moderate right pleural effusion and  pulmonary vascular congestion. 2. Cardiac enlargement. Electronically Signed   By: Kerby Moors M.D.   On: 11/09/2016 16:35    PHYSICAL EXAM CVP 3-4 General: NAD. In bed. No dyspnea Anicteric  Neck: JVP flat. No thyromegaly or thyroid nodule. Carotids 2+ Lungs: CTAB. On room air. No wheeze CV: Lateral PMI.  Heart regular S1/S2, no s3 no murmur. No edema.     Abdomen: Soft, nontender, no hepatosplenomegaly, nondistended  Good BS Neurologic: Alert and oriented x 3.  Moves all 4 extremities  Psych: Normal affect. Extremities: No clubbing or cyanosis. War.  no edema  TELEMETRY: NSR 90s. Personally reviewed   ASSESSMENT AND PLAN: 51 yo with history of CAD s/p anterior MI in 2010 and chronic systolic CHF (presumed to be ischemic cardiomyopathy) was admitted with about 10 days of increased dyspnea and nausea, found to have low output HF.  1. Acute on chronic systolic CHF: Echo 14/97 with EF 25-30%, ischemic cardiomyopathy.  He was admitted with marked volume overload.  He became hypotensive 3/18 with SBP in 80s, poor UOP.  Co-ox sent, was 36.5% initially with CVP 20+.  Started on norepinephrine + milrinone.  Norepi stopped 3/21. Now weaning milrinone, down to 0.125 today. Today's CO-OX 76%, CVP 4.  - Will stop milrinone today.  - Continue digoxin, level not elevated.  - Increase hydralazine to 37.5 tid. Continue Imdur - No spiro yet due to elevated. K.  - Continue to hold diuretics with CVP 3-4 range. Consider torsemide 40 mg bid tomorrow.  - No b-blocker with low output  2. AKI: Suspect cardiorenal syndrome with low output and hypotension as cause.   - Creatinine trending down to from 2.69 peak to 1.57 today. Tolerated cath well.  3. CAD: NSTEMI.   -  LHC with 99% ulcerated lesion proximal RCA with left to right collaterals, 95% mid LAD stenosis after mid LAD stent.   -  s/p PCI to RCA and LAD on 3/23.  On plavix and ASA. - Cover with heparin gtt while INR < 2 . Warfarin restarted.  Discussed with PharmD.  - Continue high dose statin.  - He will need to be on Plavix + warfarin long-term, would drop aspirin 81 thirty days after PCI.  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 Discussed with PharmD 5. DM2: covering with sliding scale.   [name] MD 11/26/2016 2:14 PM

## 2016-11-26 NOTE — Progress Notes (Signed)
CARDIAC REHAB PHASE I   PRE:  Rate/Rhythm: 93 SR    BP: sitting 132/80    SaO2: 98 RA  MODE:  Ambulation: 470 ft   POST:  Rate/Rhythm: 105 ST    BP: sitting 136/77     SaO2: 99 RA  Tolerated well, no c/o. Ed completed with pt including stent, HF, diet, ex, daily wts, low sodium, NTG, and CRPII. Pt voiced understanding however I am not sure how much diet and ex change he will do. Encouraged him to watch videos and read information as well. He did do CRPII in the past and would like to do it again. Will refer to [place].  Gave HF booklet. He drinks regular Sprite and sts he cannot change that.  [place], ACSM 11/26/2016 11:35 AM

## 2016-11-27 LAB — CBC
HCT: 34.9 % — ABNORMAL LOW (ref 39.0–52.0)
HEMOGLOBIN: 11.1 g/dL — AB (ref 13.0–17.0)
MCH: 24.7 pg — ABNORMAL LOW (ref 26.0–34.0)
MCHC: 31.8 g/dL (ref 30.0–36.0)
MCV: 77.6 fL — ABNORMAL LOW (ref 78.0–100.0)
PLATELETS: 310 10*3/uL (ref 150–400)
RBC: 4.5 MIL/uL (ref 4.22–5.81)
RDW: 16.6 % — AB (ref 11.5–15.5)
WBC: 8.2 10*3/uL (ref 4.0–10.5)

## 2016-11-27 LAB — COOXEMETRY PANEL
Carboxyhemoglobin: 1.4 % (ref 0.5–1.5)
Methemoglobin: 0.9 % (ref 0.0–1.5)
O2 Saturation: 59.5 %
TOTAL HEMOGLOBIN: 11.6 g/dL — AB (ref 12.0–16.0)

## 2016-11-27 LAB — GLUCOSE, CAPILLARY
GLUCOSE-CAPILLARY: 120 mg/dL — AB (ref 65–99)
GLUCOSE-CAPILLARY: 140 mg/dL — AB (ref 65–99)
GLUCOSE-CAPILLARY: 171 mg/dL — AB (ref 65–99)
Glucose-Capillary: 144 mg/dL — ABNORMAL HIGH (ref 65–99)

## 2016-11-27 LAB — BASIC METABOLIC PANEL
Anion gap: 8 (ref 5–15)
BUN: 30 mg/dL — AB (ref 6–20)
CHLORIDE: 105 mmol/L (ref 101–111)
CO2: 22 mmol/L (ref 22–32)
Calcium: 8.6 mg/dL — ABNORMAL LOW (ref 8.9–10.3)
Creatinine, Ser: 1.51 mg/dL — ABNORMAL HIGH (ref 0.61–1.24)
GFR calc Af Amer: 60 mL/min — ABNORMAL LOW (ref >60)
GFR calc non Af Amer: 51 mL/min — ABNORMAL LOW (ref >60)
GLUCOSE: 122 mg/dL — AB (ref 65–99)
POTASSIUM: 5.1 mmol/L (ref 3.5–5.1)
SODIUM: 135 mmol/L (ref 135–145)

## 2016-11-27 LAB — PROTIME-INR
INR: 1.04
PROTHROMBIN TIME: 13.6 s (ref 11.4–15.2)

## 2016-11-27 MED ORDER — WARFARIN SODIUM 10 MG PO TABS
10.0000 mg | ORAL_TABLET | Freq: Once | ORAL | Status: AC
  Administered 2016-11-27: 10 mg via ORAL
  Filled 2016-11-27: qty 1

## 2016-11-27 MED ORDER — ISOSORBIDE MONONITRATE ER 30 MG PO TB24
30.0000 mg | ORAL_TABLET | Freq: Two times a day (BID) | ORAL | Status: DC
  Administered 2016-11-27 – 2016-11-28 (×2): 30 mg via ORAL
  Filled 2016-11-27 (×2): qty 1

## 2016-11-27 MED ORDER — HYDRALAZINE HCL 50 MG PO TABS
50.0000 mg | ORAL_TABLET | Freq: Three times a day (TID) | ORAL | Status: DC
Start: 2016-11-27 — End: 2016-11-28
  Administered 2016-11-27: 50 mg via ORAL
  Filled 2016-11-27: qty 1

## 2016-11-27 NOTE — Progress Notes (Signed)
Patient ID: [name], male   DOB: 1966/07/30, 51 y.o.   MRN: [id number]   SUBJECTIVE:    Underwent stenting of mLAD and prox RCA on 3/23. Feels good. No CP.   Milrinone stopped  yesterday and hydralazine increased.   Feels ok Denies dyspnea. Ambulating room   CVP 3-4 (checked personally). SBP 120s  Co-ox 60%  Creatinine 1.9>1.9>1.69> 1.57> 1.5   K 5.1  RHC/LHC 1. Normal filling pressures after diuresis, preserved cardiac output on milrinone.  2. 99% ulcerated lesion proximal RCA with left to right collaterals.  95% mid LAD stenosis after mid LAD stent.    Scheduled Meds: . aspirin  81 mg Oral Daily  . atorvastatin  80 mg Oral q1800  . Chlorhexidine Gluconate Cloth  6 each Topical Daily  . clopidogrel  75 mg Oral Daily  . digoxin  0.125 mg Oral Daily  . heparin  5,000 Units Subcutaneous Q8H  . hydrALAZINE  25 mg Oral Q8H  . insulin aspart  0-15 Units Subcutaneous TID WC  . insulin aspart  0-5 Units Subcutaneous QHS  . insulin aspart  3 Units Subcutaneous TID WC  . insulin glargine  30 Units Subcutaneous q morning - 10a  . isosorbide mononitrate  30 mg Oral Daily  . sodium chloride flush  3 mL Intravenous Q12H  . warfarin  10 mg Oral ONCE-1800  . Warfarin - Pharmacist Dosing Inpatient   Does not apply q1800   Continuous Infusions:  PRN Meds:.sodium chloride, acetaminophen, ALPRAZolam, alum & mag hydroxide-simeth, ondansetron (ZOFRAN) IV, sodium chloride flush, zolpidem    Vitals:   11/27/16 0750 11/27/16 0800 11/27/16 1158 11/27/16 1200  BP: 125/86  126/82   Pulse:      Resp: 19 (!) 21 19 (!) 21  Temp:   98.2 F (36.8 C)   TempSrc:   Oral   SpO2:   100%   Weight:      Height:        Intake/Output Summary (Last 24 hours) at 11/27/16 1428 Last data filed at 11/27/16 0800  Gross per 24 hour  Intake              620 ml  Output                0 ml  Net              620 ml    LABS: Basic Metabolic Panel:  Recent Labs  11/26/16 0414 11/27/16 0510    NA 138 135  K 5.2* 5.1  CL 103 105  CO2 26 22  GLUCOSE 146* 122*  BUN 27* 30*  CREATININE 1.57* 1.51*  CALCIUM 8.7* 8.6*   Liver Function Tests: No results for input(s): AST, ALT, ALKPHOS, BILITOT, PROT, ALBUMIN in the last 72 hours. No results for input(s): LIPASE, AMYLASE in the last 72 hours. CBC:  Recent Labs  11/26/16 0414 11/27/16 0510  WBC 7.7 8.2  HGB 10.7* 11.1*  HCT 33.8* 34.9*  MCV 77.9* 77.6*  PLT 333 310   Cardiac Enzymes: No results for input(s): CKTOTAL, CKMB, CKMBINDEX, TROPONINI in the last 72 hours. BNP: Invalid input(s): POCBNP D-Dimer: No results for input(s): DDIMER in the last 72 hours. Hemoglobin A1C: No results for input(s): HGBA1C in the last 72 hours. Fasting Lipid Panel: No results for input(s): CHOL, HDL, LDLCALC, TRIG, CHOLHDL, LDLDIRECT in the last 72 hours. Thyroid Function Tests: No results for input(s): TSH, T4TOTAL, T3FREE, THYROIDAB in the last 72 hours.  Invalid input(s): FREET3 Anemia Panel: No results for input(s): VITAMINB12, FOLATE, FERRITIN, TIBC, IRON, RETICCTPCT in the last 72 hours.  RADIOLOGY: Dg Chest 2 View  Result Date: 11/09/2016 CLINICAL DATA:  Fatigue. Dyspnea on exertion and lower abdominal pain. EXAM: CHEST  2 VIEW COMPARISON:  08/20/2016 FINDINGS: There is a left chest wall pacer device with lead in the right atrial appendage and right ventricle. The heart size is enlarged. There is asymmetric elevation of the right hemidiaphragm. There is a moderate right pleural effusion which appears new from previous exam. Pulmonary vascular congestion is identified. IMPRESSION: 1. Moderate right pleural effusion and pulmonary vascular congestion. 2. Cardiac enlargement. Electronically Signed   By: Kerby Moors M.D.   On: 11/09/2016 16:35    PHYSICAL EXAM CVP 3-4 General: NAD. In bed. No dyspnea Anicteric  Neck: JVP flat. No thyromegaly or thyroid nodule. Carotids 2+ Lungs: CTAB. On room air. No wheeze CV: Lateral PMI.   Heart regular S1/S2, no s3 no murmur. No edema.     Abdomen: Soft, nontender, no hepatosplenomegaly, nondistended  Good BS Neurologic: Alert and oriented x 3.  Moves all 4 extremities  Psych: Normal affect. Extremities: No clubbing or cyanosis. War.  no edema  TELEMETRY: NSR 90s. Personally reviewed   ASSESSMENT AND PLAN: 51 yo with history of CAD s/p anterior MI in 2010 and chronic systolic CHF (presumed to be ischemic cardiomyopathy) was admitted with about 10 days of increased dyspnea and nausea, found to have low output HF.  1. Acute on chronic systolic CHF: Echo 53/64 with EF 25-30%, ischemic cardiomyopathy.  He was admitted with marked volume overload.  He became hypotensive 3/18 with SBP in 80s, poor UOP.  Co-ox sent, was 36.5% initially with CVP 20+.  Started on norepinephrine + milrinone.  Norepi stopped 3/21. - Milrinone stopped 3/24. Co-ox down a bit today but still ok. Will watch another day - Continue digoxin, level not elevated.  - Increase hydralazine to 50 tid. Imdur to 30 bid  - No spiro yet due to elevated. K.  - Continue to hold diuretics with CVP 3-4 range. Consider torsemide 40 mg daily or bid tomorrow.  - No b-blocker with low output  2. AKI: Suspect cardiorenal syndrome with low output and hypotension as cause.   - Creatinine trending down to from 2.69 peak to 1.51 today.  3. CAD: NSTEMI.   -  LHC with 99% ulcerated lesion proximal RCA with left to right collaterals, 95% mid LAD stenosis after mid LAD stent.   -  s/p PCI to RCA and LAD on 3/23.  On plavix and ASA. - Discussed with Dr. Aundra Dubin and PharmD  Warfarin restarted. No need for heparin bridg as DVTs were remote.  - Continue high dose statin.  - He will need to be on Plavix + warfarin long-term, would drop aspirin 81 thirty days after PCI.  - CR seeing 4. h/o DVTs: Coumadin restarted. No need for heparin bridge. On [place] heparin for prophylaxis 5. DM2: covering with sliding scale.   Possible d/c home tomorrow  if co-ox ok.   [name] MD 11/27/2016 2:28 PM

## 2016-11-27 NOTE — Progress Notes (Signed)
ANTICOAGULATION CONSULT NOTE - Follow Up Consult  Pharmacy Consult for restart coumadin Indication: history of DVT  Allergies  Allergen Reactions  . Metformin And Related Diarrhea    Patient Measurements: Height: 5' 8"  (172.7 cm) Weight: 180 lb 8 oz (81.9 kg) IBW/kg (Calculated) : 68.4 Heparin Dosing Weight: 88 kg  Vital Signs: Temp: 98.2 F (36.8 C) (03/25 0749) Temp Source: Oral (03/25 0749) BP: 125/86 (03/25 0750)  Labs:  Recent Labs  11/25/16 0430 11/26/16 0414 11/27/16 0510  HGB 11.0* 10.7* 11.1*  HCT 34.6* 33.8* 34.9*  PLT 325 333 310  LABPROT 14.9 14.1 13.6  INR 1.17 1.09 1.04  HEPARINUNFRC 1.03*  --   --   CREATININE 1.69* 1.57* 1.51*    Estimated Creatinine Clearance: 55.4 mL/min (A) (by C-G formula based on SCr of 1.51 mg/dL (H)).  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 Warfarin PTA held for cath  Patient now post cath. Heparin drip not restarted post cath per discussion with cardiology, warfarin restarted. INR still low at 1.0. CBC stable overnight.    Goal of Therapy:  INR goal 2-3 Monitor platelets by anticoagulation protocol: Yes   Plan: Repeat warfarin 7m tonight Daily INR for now, stop sq heparin when INR>2 Follow up need for systemic anticoagulation given unchanged INR  FErin HearingPharmD., BCPS Clinical Pharmacist Pager [phone]/25/2018 9:29 AM

## 2016-11-28 ENCOUNTER — Encounter (HOSPITAL_COMMUNITY): Payer: Self-pay | Admitting: Cardiology

## 2016-11-28 ENCOUNTER — Ambulatory Visit (INDEPENDENT_AMBULATORY_CARE_PROVIDER_SITE_OTHER): Payer: BLUE CROSS/BLUE SHIELD

## 2016-11-28 DIAGNOSIS — I5022 Chronic systolic (congestive) heart failure: Secondary | ICD-10-CM

## 2016-11-28 DIAGNOSIS — Z95810 Presence of automatic (implantable) cardiac defibrillator: Secondary | ICD-10-CM

## 2016-11-28 LAB — CBC
HEMATOCRIT: 33.2 % — AB (ref 39.0–52.0)
HEMOGLOBIN: 10.4 g/dL — AB (ref 13.0–17.0)
MCH: 24.4 pg — ABNORMAL LOW (ref 26.0–34.0)
MCHC: 31.3 g/dL (ref 30.0–36.0)
MCV: 77.8 fL — ABNORMAL LOW (ref 78.0–100.0)
Platelets: 341 10*3/uL (ref 150–400)
RBC: 4.27 MIL/uL (ref 4.22–5.81)
RDW: 16.7 % — ABNORMAL HIGH (ref 11.5–15.5)
WBC: 7.2 10*3/uL (ref 4.0–10.5)

## 2016-11-28 LAB — BASIC METABOLIC PANEL
Anion gap: 8 (ref 5–15)
BUN: 34 mg/dL — AB (ref 6–20)
CHLORIDE: 107 mmol/L (ref 101–111)
CO2: 22 mmol/L (ref 22–32)
CREATININE: 1.38 mg/dL — AB (ref 0.61–1.24)
Calcium: 8.6 mg/dL — ABNORMAL LOW (ref 8.9–10.3)
GFR calc non Af Amer: 57 mL/min — ABNORMAL LOW (ref >60)
Glucose, Bld: 136 mg/dL — ABNORMAL HIGH (ref 65–99)
Potassium: 4.8 mmol/L (ref 3.5–5.1)
SODIUM: 137 mmol/L (ref 135–145)

## 2016-11-28 LAB — PROTIME-INR
INR: 1.2
Prothrombin Time: 15.3 seconds — ABNORMAL HIGH (ref 11.4–15.2)

## 2016-11-28 LAB — COOXEMETRY PANEL
CARBOXYHEMOGLOBIN: 1.4 % (ref 0.5–1.5)
METHEMOGLOBIN: 0.9 % (ref 0.0–1.5)
O2 SAT: 71 %
Total hemoglobin: 11.1 g/dL — ABNORMAL LOW (ref 12.0–16.0)

## 2016-11-28 LAB — GLUCOSE, CAPILLARY
GLUCOSE-CAPILLARY: 112 mg/dL — AB (ref 65–99)
Glucose-Capillary: 137 mg/dL — ABNORMAL HIGH (ref 65–99)

## 2016-11-28 MED ORDER — SPIRONOLACTONE 25 MG PO TABS: 12.5000 mg | ORAL_TABLET | Freq: Every day | ORAL | Status: DC

## 2016-11-28 MED ORDER — WARFARIN SODIUM 2.5 MG PO TABS
12.5000 mg | ORAL_TABLET | ORAL | Status: AC
  Administered 2016-11-28: 12.5 mg via ORAL
  Filled 2016-11-28: qty 1

## 2016-11-28 MED ORDER — HYDRALAZINE HCL 50 MG PO TABS: 50.0000 mg | ORAL_TABLET | Freq: Three times a day (TID) | ORAL | 6 refills | Status: DC

## 2016-11-28 MED ORDER — ISOSORBIDE MONONITRATE ER 60 MG PO TB24: 60.0000 mg | ORAL_TABLET | Freq: Every day | ORAL | 6 refills | Status: DC

## 2016-11-28 MED ORDER — SPIRONOLACTONE 25 MG PO TABS: 12.5000 mg | ORAL_TABLET | Freq: Every day | ORAL | 6 refills | Status: DC

## 2016-11-28 MED ORDER — ATORVASTATIN CALCIUM 80 MG PO TABS: 80.0000 mg | ORAL_TABLET | Freq: Every day | ORAL | 6 refills | Status: DC

## 2016-11-28 MED ORDER — TORSEMIDE 20 MG PO TABS: 20.0000 mg | ORAL_TABLET | Freq: Every evening | ORAL | Status: DC

## 2016-11-28 MED ORDER — TORSEMIDE 20 MG PO TABS: ORAL_TABLET | ORAL | 6 refills | Status: DC

## 2016-11-28 MED ORDER — ASPIRIN 81 MG PO TABS: 81.0000 mg | ORAL_TABLET | Freq: Every day | ORAL | 0 refills | Status: DC

## 2016-11-28 MED ORDER — WARFARIN SODIUM 5 MG PO TABS: ORAL_TABLET | ORAL | 0 refills | Status: DC

## 2016-11-28 MED ORDER — ISOSORBIDE MONONITRATE ER 60 MG PO TB24
60.0000 mg | ORAL_TABLET | Freq: Every day | ORAL | Status: DC
  Administered 2016-11-28: 30 mg via ORAL

## 2016-11-28 MED ORDER — DIGOXIN 125 MCG PO TABS: 0.1250 mg | ORAL_TABLET | Freq: Every day | ORAL | 11 refills | Status: DC

## 2016-11-28 MED ORDER — TORSEMIDE 20 MG PO TABS
40.0000 mg | ORAL_TABLET | Freq: Every day | ORAL | Status: DC
  Administered 2016-11-28: 40 mg via ORAL
  Filled 2016-11-28: qty 2

## 2016-11-28 MED ORDER — CLOPIDOGREL BISULFATE 75 MG PO TABS: 75.0000 mg | ORAL_TABLET | Freq: Every day | ORAL | 6 refills | Status: DC

## 2016-11-28 NOTE — Discharge Summary (Signed)
Advanced Heart Failure Discharge Note  Discharge Summary   Patient ID: [name] MRN: [id number], DOB/AGE: 52/28/1967 52 y.o. Admit date: 11/18/2016 D/C date:     11/28/2016   Primary Discharge Diagnoses:  1. Acute on chronic systolic CHF 2. AKI 3. CAD: NSTEMI s/p PCI to RCA and LAD on 11/25/16 4. H/o DVTs 5. DM2  Hospital Course:  51 yo with history of CAD s/p anterior MI in 2010 and chronic systolic CHF (presumed to be ischemic cardiomyopathy) was admitted 11/18/16 with about 10 days of increased dyspnea and nausea, found to have low output HF.   He became hypotensive 3/18 with SBP in 80s, poor UOP.  Co-ox sent, was 36.5% initially with CVP 20+.  Started on norepinephrine + milrinone up to 0.375 mcg/kg/min. Diuresed 10.9 L and down 26 lbs. Pressors weaned as tolerated.   Cardiac cath 11/23/16 with Normal filling pressures after diuresis and preserved cardiac output ON milrinone.   Coronary angiography with 99% ulcerate lesion proximal RCA with left to right collaterals with 95% mid LAD stenosis after mid LAD stent. Full report as below.   Underwent stenting of mLAD and prox RCA on 3/57 without complication. Tolerated wean of milrinone over weekend of 11/26/16.   On am of 11/28/16 had Coox of 71% off inotrope support and asymptomatic while walking halls.   Pt will be discharged to home in stable condition with close follow up as below.  Coumadin clinic follow up arranged.   Pt instructed to call HF clinic sooner with any worsening symptoms of CP, SOB, DOE, Orthopnea, or edema.   Discharge Weight: 181 lbs Discharge Vitals: Blood pressure 129/82, pulse 96, temperature 97.7 F (36.5 C), temperature source Oral, resp. rate (!) 22, height 5' 8"  (1.727 m), weight 181 lb 8 oz (82.3 kg), SpO2 99 %.  Labs: Lab Results  Component Value Date   WBC 7.2 11/28/2016   HGB 10.4 (L) 11/28/2016   HCT 33.2 (L) 11/28/2016   MCV 77.8 (L) 11/28/2016   PLT 341 11/28/2016     Recent Labs Lab  11/28/16 0439  NA 137  K 4.8  CL 107  CO2 22  BUN 34*  CREATININE 1.38*  CALCIUM 8.6*  GLUCOSE 136*   Lab Results  Component Value Date   CHOL 160 11/20/2016   HDL 39 (L) 11/20/2016   LDLCALC 99 11/20/2016   TRIG 111 11/20/2016   BNP (last 3 results)  Recent Labs  08/20/16 1120 11/09/16 1505 11/18/16 1830  BNP 1,536.9* 383.5* 2,048.7*    ProBNP (last 3 results) No results for input(s): PROBNP in the last 8760 hours.   Diagnostic Studies/Procedures   No results found.  Discharge Medications   Allergies as of 11/28/2016      Reactions   Metformin And Related Diarrhea      Medication List    STOP taking these medications   carvedilol 25 MG tablet Commonly known as:  COREG   furosemide 80 MG tablet Commonly known as:  LASIX   ondansetron 4 MG tablet Commonly known as:  ZOFRAN   potassium chloride 10 MEQ tablet Commonly known as:  K-DUR     TAKE these medications   aspirin 81 MG tablet Take 1 tablet (81 mg total) by mouth daily.   atorvastatin 80 MG tablet Commonly known as:  LIPITOR Take 1 tablet (80 mg total) by mouth daily at 6 PM.   clopidogrel 75 MG tablet Commonly known as:  PLAVIX Take 1 tablet (75 mg total)  by mouth daily. Start taking on:  11/29/2016   digoxin 0.125 MG tablet Commonly known as:  LANOXIN Take 1 tablet (0.125 mg total) by mouth daily.   hydrALAZINE 50 MG tablet Commonly known as:  APRESOLINE Take 1 tablet (50 mg total) by mouth 3 (three) times daily. What changed:  medication strength  how much to take   insulin glargine 100 UNIT/ML injection Commonly known as:  LANTUS Inject 0.3 mLs (30 Units total) into the skin every morning. Sliding scale.   isosorbide mononitrate 60 MG 24 hr tablet Commonly known as:  IMDUR Take 1 tablet (60 mg total) by mouth daily. What changed:  how much to take   nitroGLYCERIN 0.4 MG SL tablet Commonly known as:  NITROSTAT Place 1 tablet (0.4 mg total) under the tongue every 5  (five) minutes as needed for chest pain.   spironolactone 25 MG tablet Commonly known as:  ALDACTONE Take 0.5 tablets (12.5 mg total) by mouth daily. What changed:  how much to take   torsemide 20 MG tablet Commonly known as:  DEMADEX Take 40 mg (2 tablets) every am and 20 mg (tablet) every pm   warfarin 5 MG tablet Commonly known as:  COUMADIN Take 10 mg (2 tablets) 11/29/16, and then return to previous home dosing of 5 mg daily except for 7.5 mg ( 1 and 1 half tablets) on Fridays. Start taking on:  11/29/2016 What changed:  additional instructions       Disposition   The patient will be discharged in stable condition to home. Discharge Instructions    (HEART FAILURE PATIENTS) Call MD:  Anytime you have any of the following symptoms: 1) 3 pound weight gain in 24 hours or 5 pounds in 1 week 2) shortness of breath, with or without a dry hacking cough 3) swelling in the hands, feet or stomach 4) if you have to sleep on extra pillows at night in order to breathe.    Complete by:  As directed    Amb Referral to Cardiac Rehabilitation    Complete by:  As directed    Diagnosis:   Coronary Stents PTCA Heart Failure (see criteria below if ordering Phase II)     Heart Failure Type:  Chronic Systolic & Diastolic   Diet - low sodium heart healthy    Complete by:  As directed    Heart Failure patients record your daily weight using the same scale at the same time of day    Complete by:  As directed    Increase activity slowly    Complete by:  As directed    PICC line removal    Complete by:  As directed      Follow-up Information    Loralie Champagne, MD Follow up on 12/13/2016.   Specialty:  Cardiology Why:  at 1030 for post hospital follow up. Please bring all of your medications to your visit. The code for parking is [phone]. Leisure centre manager through [employer] off of [address]. Underground parking on right. Can also park in lower ED lot and enter blue awning.  Contact information: [place] [address] [phone]        [facility] Follow up on 12/01/2016.   Specialty:  Cardiology Why:  at 1000 for INR check . Contact information: [address] [place] [phone]            Duration of Discharge Encounter: Greater than  35 minutes   Signed, Shirley Friar, PA-C 11/28/2016, 5:28 PM

## 2016-11-28 NOTE — Care Management Note (Signed)
Case Management Note  Patient Details  Name: [name] MRN: [id number] Date of Birth: Mar 05, 1966  Subjective/Objective:                    Action/Plan: Pt discharging home with self care. Pt has insurance and PCP. No further needs per CM.  Expected Discharge Date:  11/28/16               Expected Discharge Plan:  Home/Self Care  In-House Referral:     Discharge planning Services  CM Consult  Post Acute Care Choice:    Choice offered to:     DME Arranged:    DME Agency:     HH Arranged:    HH Agency:     Status of Service:  Completed, signed off  If discussed at [employer] of Stay Meetings, dates discussed:    Additional Comments:  Pollie Friar, RN 11/28/2016, 11:52 AM

## 2016-11-28 NOTE — Progress Notes (Signed)
ANTICOAGULATION CONSULT NOTE - Follow Up Consult  Pharmacy Consult for restart coumadin Indication: history of DVT  Allergies  Allergen Reactions  . Metformin And Related Diarrhea    Patient Measurements: Height: 5' 8"  (172.7 cm) Weight: 181 lb 8 oz (82.3 kg) IBW/kg (Calculated) : 68.4 Heparin Dosing Weight: 88 kg  Vital Signs: Temp: 97.7 F (36.5 C) (03/26 1149) Temp Source: Oral (03/26 1149) BP: 129/82 (03/26 1149) Pulse Rate: 96 (03/26 1149)  Labs:  Recent Labs  11/26/16 0414 11/27/16 0510 11/28/16 0439  HGB 10.7* 11.1* 10.4*  HCT 33.8* 34.9* 33.2*  PLT 333 310 341  LABPROT 14.1 13.6 15.3*  INR 1.09 1.04 1.20  CREATININE 1.57* 1.51* 1.38*    Estimated Creatinine Clearance: 65.5 mL/min (A) (by C-G formula based on SCr of 1.38 mg/dL (H)).  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 Warfarin PTA held for cath  Patient now post cath. Heparin drip not restarted post cath per discussion with cardiology started vte px with heaprin sq and warfarin restarted. INR still low at 1.2. CBC stable overnight.   Home warfarin dose 70m daily but 7.523mon Fri  Goal of Therapy:  INR goal 2-3 Monitor platelets by anticoagulation protocol: Yes   Plan: warfarin 12.48m59m1 prior to dc then 28m74mmorrow then resume home dose and f/u in CVRR this week  LisaBonnita Nasutirm.D. CPP, BCPS Clinical Pharmacist [phone] 1:18 PM

## 2016-11-28 NOTE — Progress Notes (Signed)
Patient ID: [name], male   DOB: 01-17-1966, 51 y.o.   MRN: [id number]   SUBJECTIVE:    Underwent stenting of mLAD and prox RCA on 3/23. Feels good. No CP.  No dyspnea walking in hall.   Milrinone stopped over the weekend and hydralazine increased.    CVP 8. SBP 120s  Co-ox 71%  Creatinine 1.9>1.9>1.69> 1.57> 1.5 > 1.38   K 5.1  RHC/LHC 1. Normal filling pressures after diuresis, preserved cardiac output on milrinone.  2. 99% ulcerated lesion proximal RCA with left to right collaterals.  95% mid LAD stenosis after mid LAD stent.    Scheduled Meds: . aspirin  81 mg Oral Daily  . atorvastatin  80 mg Oral q1800  . Chlorhexidine Gluconate Cloth  6 each Topical Daily  . clopidogrel  75 mg Oral Daily  . digoxin  0.125 mg Oral Daily  . heparin  5,000 Units Subcutaneous Q8H  . hydrALAZINE  50 mg Oral Q8H  . insulin aspart  0-15 Units Subcutaneous TID WC  . insulin aspart  0-5 Units Subcutaneous QHS  . insulin aspart  3 Units Subcutaneous TID WC  . insulin glargine  30 Units Subcutaneous q morning - 10a  . isosorbide mononitrate  30 mg Oral BID  . sodium chloride flush  3 mL Intravenous Q12H  . spironolactone  12.5 mg Oral Daily  . torsemide  20 mg Oral QPM  . torsemide  40 mg Oral Daily  . Warfarin - Pharmacist Dosing Inpatient   Does not apply q1800   Continuous Infusions:  PRN Meds:.sodium chloride, acetaminophen, ALPRAZolam, alum & mag hydroxide-simeth, ondansetron (ZOFRAN) IV, sodium chloride flush, zolpidem    Vitals:   11/27/16 2003 11/27/16 2344 11/28/16 0500 11/28/16 0749  BP: 128/79 117/74 123/75 (!) 151/81  Pulse: 92 94  84  Resp: (!) 25 16  17   Temp: 98.2 F (36.8 C) 98.7 F (37.1 C) 98.4 F (36.9 C) 98.2 F (36.8 C)  TempSrc: Oral Oral Oral Oral  SpO2: 99% 98% 99% 99%  Weight:   181 lb 8 oz (82.3 kg)   Height:        Intake/Output Summary (Last 24 hours) at 11/28/16 0952 Last data filed at 11/27/16 1200  Gross per 24 hour  Intake               240 ml  Output              200 ml  Net               40 ml    LABS: Basic Metabolic Panel:  Recent Labs  11/27/16 0510 11/28/16 0439  NA 135 137  K 5.1 4.8  CL 105 107  CO2 22 22  GLUCOSE 122* 136*  BUN 30* 34*  CREATININE 1.51* 1.38*  CALCIUM 8.6* 8.6*   Liver Function Tests: No results for input(s): AST, ALT, ALKPHOS, BILITOT, PROT, ALBUMIN in the last 72 hours. No results for input(s): LIPASE, AMYLASE in the last 72 hours. CBC:  Recent Labs  11/27/16 0510 11/28/16 0439  WBC 8.2 7.2  HGB 11.1* 10.4*  HCT 34.9* 33.2*  MCV 77.6* 77.8*  PLT 310 341   Cardiac Enzymes: No results for input(s): CKTOTAL, CKMB, CKMBINDEX, TROPONINI in the last 72 hours. BNP: Invalid input(s): POCBNP D-Dimer: No results for input(s): DDIMER in the last 72 hours. Hemoglobin A1C: No results for input(s): HGBA1C in the last 72 hours. Fasting Lipid Panel: No results for input(s):  CHOL, HDL, LDLCALC, TRIG, CHOLHDL, LDLDIRECT in the last 72 hours. Thyroid Function Tests: No results for input(s): TSH, T4TOTAL, T3FREE, THYROIDAB in the last 72 hours.  Invalid input(s): FREET3 Anemia Panel: No results for input(s): VITAMINB12, FOLATE, FERRITIN, TIBC, IRON, RETICCTPCT in the last 72 hours.  RADIOLOGY: Dg Chest 2 View  Result Date: 11/09/2016 CLINICAL DATA:  Fatigue. Dyspnea on exertion and lower abdominal pain. EXAM: CHEST  2 VIEW COMPARISON:  08/20/2016 FINDINGS: There is a left chest wall pacer device with lead in the right atrial appendage and right ventricle. The heart size is enlarged. There is asymmetric elevation of the right hemidiaphragm. There is a moderate right pleural effusion which appears new from previous exam. Pulmonary vascular congestion is identified. IMPRESSION: 1. Moderate right pleural effusion and pulmonary vascular congestion. 2. Cardiac enlargement. Electronically Signed   By: Kerby Moors M.D.   On: 11/09/2016 16:35    PHYSICAL EXAM CVP 8 General: NAD. Neck:  JVP 7. No thyromegaly or thyroid nodule. Carotids 2+ Lungs: CTAB. On room air. No wheezes.  CV: Lateral PMI.  Heart regular S1/S2, no s3 no murmur. No edema.     Abdomen: Soft, nontender, no hepatosplenomegaly, nondistended  Good BS Neurologic: Alert and oriented x 3.  Moves all 4 extremities  Psych: Normal affect. Extremities: No clubbing or cyanosis. War.  no edema  TELEMETRY: NSR 90s. Personally reviewed   ASSESSMENT AND PLAN: 51 yo with history of CAD s/p anterior MI in 2010 and chronic systolic CHF (presumed to be ischemic cardiomyopathy) was admitted with about 10 days of increased dyspnea and nausea, found to have low output HF.  1. Acute on chronic systolic CHF: Echo 19/41 with EF 25-30%, ischemic cardiomyopathy.  He was admitted with marked volume overload.  He became hypotensive 3/18 with SBP in 80s, poor UOP.  Co-ox sent, was 36.5% initially with CVP 20+.  Started on norepinephrine + milrinone.  Norepi stopped 3/21, milrinone stopped 3/24.  Co-ox 71% today, CVP 8.  Feels good.  Weight down considerably.  - Continue digoxin, level not elevated.  - Continue hydralazine 50 mg tid + Imdur 60 daily. - Can start torsemide 40 qam/20 qpm today and will restart spironolactone 12.5 daily.   - No b-blocker with low output  - Creatinine trending down, suspect we will eventually start ACEI or ARNI, but will do so as outpatient.  2. AKI: Suspect cardiorenal syndrome with low output and hypotension as cause.   - Creatinine trending down to from 2.69 peak to 1.38 today.  3. CAD: NSTEMI.  LHC with 99% ulcerated lesion proximal RCA with left to right collaterals, 95% mid LAD stenosis after mid LAD stent.  s/p PCI to RCA and LAD on 3/23.   - On plavix and ASA.  Will stop ASA after 1 month given use of warfarin.  - Continue warfarin, will not bridge with heparin/Lovenox. Aim for INR 2-2.5 with Plavix.  - Continue high dose statin.  - CR seeing 4. h/o DVTs: Coumadin restarted. No need for heparin  bridge.  5. DM2: covering with sliding scale.  6. Disposition: Home today.  He will need followup with me in 10-14 days in CHF clinic.  He will need followup in coumadin clinic.  Meds for home: ASA 81 for 1 month then stop, Plavix 75 daily, coumadin with INR 2-2.5, atorvastatin 80 daily, torsemide 40 qam/20 qpm, spironolactone 12.5 daily, hydralazine 50 tid, Imdur 60 daily, digoxin 0.125, home diabetes regimen.   Loralie Champagne MD 11/28/2016 9:52  AM

## 2016-11-28 NOTE — Progress Notes (Signed)
error 

## 2016-11-28 NOTE — Progress Notes (Signed)
CARDIAC REHAB PHASE I   PRE:  Rate/Rhythm: 92 SR  BP:  Supine:   Sitting: 120/75  Standing:    SaO2: 98%RA  MODE:  Ambulation: 470 ft   POST:  Rate/Rhythm: 106 ST  BP:  Supine:   Sitting: 127/70  Standing:    SaO2: 95%RA 0900-0915 Pt walked 470 ft on RA with steady gait. Did not want to walk farther as his hips started to hurt. To recliner with call bell. Pt stated no questions re ed done previously. He knew plavix was to keep stent open and he stated he had had stents before. Tolerated well.   Graylon Good, RN BSN  11/28/2016 9:10 AM

## 2016-11-29 ENCOUNTER — Ambulatory Visit (INDEPENDENT_AMBULATORY_CARE_PROVIDER_SITE_OTHER): Payer: BLUE CROSS/BLUE SHIELD

## 2016-11-29 ENCOUNTER — Telehealth (HOSPITAL_COMMUNITY): Payer: Self-pay | Admitting: Internal Medicine

## 2016-11-29 DIAGNOSIS — Z95810 Presence of automatic (implantable) cardiac defibrillator: Secondary | ICD-10-CM

## 2016-11-29 DIAGNOSIS — I5022 Chronic systolic (congestive) heart failure: Secondary | ICD-10-CM

## 2016-11-29 NOTE — Telephone Encounter (Signed)
Verified [employer] insurance benefits through [employer]. Copay $30, Coinsurance 30%, No Deductible, Out of Pocket $2000.00, pt's responsibility [phone], pt has met $302.82 Reference [id number].... KJ

## 2016-11-29 NOTE — Progress Notes (Signed)
EPIC Encounter for ICM Monitoring  Late entry for 11/28/2016 Patient Name: [name] is a 51 y.o. male Date: 11/29/2016 Primary Care Physican: Darci Needle, MD Primary Cardiologist: Marlou Porch Electrophysiologist: Caryl Comes Dry Weight:181 lbs      Heart Failure questions reviewed, pt asymptomatic.  Hospital discharge 11/28/2016.  Was admitted 11/18/16 with about 10 days of increased dyspnea and nausea.  He was diuresed 26 lbs during hospitalization.    Thoracic impedance abnormal showing abrupt spike suggesting abrupt diuresis which correlates with hosptialization.    Prescribed and confirmed dosage: Torsemide 20 mg 2 tablets (40 mg total) every am and 1 tablet (20 mg total) every pm  Recommendations: No changes.  Encouraged to call for fluid symptoms.  Follow-up plan: ICM clinic phone appointment on 12/06/2016.  Copy of ICM check sent to primary cardiologist and device physician.   3 month ICM trend: 11/28/2016   1 Year ICM trend:      Rosalene Billings, RN 11/29/2016 1:59 PM

## 2016-12-02 ENCOUNTER — Ambulatory Visit (INDEPENDENT_AMBULATORY_CARE_PROVIDER_SITE_OTHER): Payer: BLUE CROSS/BLUE SHIELD | Admitting: *Deleted

## 2016-12-02 ENCOUNTER — Telehealth (HOSPITAL_COMMUNITY): Payer: Self-pay | Admitting: *Deleted

## 2016-12-02 DIAGNOSIS — I82499 Acute embolism and thrombosis of other specified deep vein of unspecified lower extremity: Secondary | ICD-10-CM

## 2016-12-02 DIAGNOSIS — Z5181 Encounter for therapeutic drug level monitoring: Secondary | ICD-10-CM | POA: Diagnosis not present

## 2016-12-02 LAB — POCT INR: INR: 1.5

## 2016-12-02 NOTE — Telephone Encounter (Signed)
[name] from [employer] disability for patient called asking for a diagnoses confirmation for heart failure.  Also requested for patient's next scheduled appointment in clinic.  This information given to [facility] and no further questions at this time.

## 2016-12-05 ENCOUNTER — Emergency Department (HOSPITAL_COMMUNITY): Payer: BLUE CROSS/BLUE SHIELD

## 2016-12-05 ENCOUNTER — Telehealth: Payer: Self-pay | Admitting: Internal Medicine

## 2016-12-05 ENCOUNTER — Observation Stay (HOSPITAL_COMMUNITY)
Admission: EM | Admit: 2016-12-05 | Discharge: 2016-12-06 | Disposition: A | Discharge status: Home / Self Care | Payer: BLUE CROSS/BLUE SHIELD | Attending: Internal Medicine | Admitting: Internal Medicine

## 2016-12-05 ENCOUNTER — Encounter (HOSPITAL_COMMUNITY): Payer: Self-pay

## 2016-12-05 DIAGNOSIS — N179 Acute kidney failure, unspecified: Secondary | ICD-10-CM | POA: Diagnosis not present

## 2016-12-05 DIAGNOSIS — Z7984 Long term (current) use of oral hypoglycemic drugs: Secondary | ICD-10-CM | POA: Insufficient documentation

## 2016-12-05 DIAGNOSIS — I110 Hypertensive heart disease with heart failure: Secondary | ICD-10-CM | POA: Diagnosis not present

## 2016-12-05 DIAGNOSIS — I2510 Atherosclerotic heart disease of native coronary artery without angina pectoris: Secondary | ICD-10-CM | POA: Insufficient documentation

## 2016-12-05 DIAGNOSIS — E875 Hyperkalemia: Secondary | ICD-10-CM | POA: Insufficient documentation

## 2016-12-05 DIAGNOSIS — I252 Old myocardial infarction: Secondary | ICD-10-CM | POA: Diagnosis not present

## 2016-12-05 DIAGNOSIS — R079 Chest pain, unspecified: Secondary | ICD-10-CM | POA: Diagnosis not present

## 2016-12-05 DIAGNOSIS — Z79899 Other long term (current) drug therapy: Secondary | ICD-10-CM | POA: Insufficient documentation

## 2016-12-05 DIAGNOSIS — I5022 Chronic systolic (congestive) heart failure: Secondary | ICD-10-CM | POA: Diagnosis not present

## 2016-12-05 DIAGNOSIS — Z7982 Long term (current) use of aspirin: Secondary | ICD-10-CM | POA: Diagnosis not present

## 2016-12-05 DIAGNOSIS — Z7901 Long term (current) use of anticoagulants: Secondary | ICD-10-CM | POA: Insufficient documentation

## 2016-12-05 DIAGNOSIS — E119 Type 2 diabetes mellitus without complications: Secondary | ICD-10-CM | POA: Diagnosis not present

## 2016-12-05 DIAGNOSIS — I5023 Acute on chronic systolic (congestive) heart failure: Secondary | ICD-10-CM | POA: Diagnosis not present

## 2016-12-05 DIAGNOSIS — R0789 Other chest pain: Secondary | ICD-10-CM | POA: Diagnosis present

## 2016-12-05 LAB — GLUCOSE, CAPILLARY
GLUCOSE-CAPILLARY: 179 mg/dL — AB (ref 65–99)
Glucose-Capillary: 72 mg/dL (ref 65–99)

## 2016-12-05 LAB — BASIC METABOLIC PANEL
ANION GAP: 11 (ref 5–15)
BUN: 85 mg/dL — AB (ref 6–20)
CALCIUM: 9.5 mg/dL (ref 8.9–10.3)
CO2: 25 mmol/L (ref 22–32)
Chloride: 95 mmol/L — ABNORMAL LOW (ref 101–111)
Creatinine, Ser: 2.1 mg/dL — ABNORMAL HIGH (ref 0.61–1.24)
GFR calc Af Amer: 40 mL/min — ABNORMAL LOW (ref >60)
GFR, EST NON AFRICAN AMERICAN: 35 mL/min — AB (ref >60)
GLUCOSE: 178 mg/dL — AB (ref 65–99)
Potassium: 6.1 mmol/L — ABNORMAL HIGH (ref 3.5–5.1)
Sodium: 131 mmol/L — ABNORMAL LOW (ref 135–145)

## 2016-12-05 LAB — CBC
HCT: 42.7 % (ref 39.0–52.0)
HEMOGLOBIN: 14 g/dL (ref 13.0–17.0)
MCH: 25 pg — AB (ref 26.0–34.0)
MCHC: 32.8 g/dL (ref 30.0–36.0)
MCV: 76.3 fL — ABNORMAL LOW (ref 78.0–100.0)
Platelets: 485 10*3/uL — ABNORMAL HIGH (ref 150–400)
RBC: 5.6 MIL/uL (ref 4.22–5.81)
RDW: 16.4 % — AB (ref 11.5–15.5)
WBC: 9.4 10*3/uL (ref 4.0–10.5)

## 2016-12-05 LAB — I-STAT TROPONIN, ED: TROPONIN I, POC: 0.1 ng/mL — AB (ref 0.00–0.08)

## 2016-12-05 LAB — POTASSIUM: Potassium: 4.8 mmol/L (ref 3.5–5.1)

## 2016-12-05 LAB — GLUCOSE, RANDOM: Glucose, Bld: 122 mg/dL — ABNORMAL HIGH (ref 65–99)

## 2016-12-05 LAB — PROTIME-INR
INR: 1.48
Prothrombin Time: 18.1 seconds — ABNORMAL HIGH (ref 11.4–15.2)

## 2016-12-05 LAB — TROPONIN I: Troponin I: 0.11 ng/mL (ref <0.03)

## 2016-12-05 LAB — DIGOXIN LEVEL: DIGOXIN LVL: 0.7 ng/mL — AB (ref 0.8–2.0)

## 2016-12-05 MED ORDER — INSULIN ASPART 100 UNIT/ML IV SOLN
5.0000 [IU] | Freq: Once | INTRAVENOUS | Status: DC
  Filled 2016-12-05: qty 0.05

## 2016-12-05 MED ORDER — HYDRALAZINE HCL 50 MG PO TABS
50.0000 mg | ORAL_TABLET | Freq: Three times a day (TID) | ORAL | Status: DC
Start: 2016-12-05 — End: 2016-12-06
  Administered 2016-12-05 – 2016-12-06 (×2): 50 mg via ORAL
  Filled 2016-12-05 (×2): qty 1

## 2016-12-05 MED ORDER — ONDANSETRON HCL 4 MG/2ML IJ SOLN: 4.0000 mg | Freq: Four times a day (QID) | INTRAMUSCULAR | Status: DC | PRN

## 2016-12-05 MED ORDER — HEPARIN (PORCINE) IN NACL 100-0.45 UNIT/ML-% IJ SOLN
1250.0000 [IU]/h | INTRAMUSCULAR | Status: DC
  Administered 2016-12-05: 1200 [IU]/h via INTRAVENOUS
  Filled 2016-12-05 (×2): qty 250

## 2016-12-05 MED ORDER — ASPIRIN 81 MG PO CHEW
81.0000 mg | CHEWABLE_TABLET | Freq: Every day | ORAL | Status: DC
  Administered 2016-12-06: 81 mg via ORAL
  Filled 2016-12-05: qty 1

## 2016-12-05 MED ORDER — ASPIRIN 81 MG PO CHEW
324.0000 mg | CHEWABLE_TABLET | Freq: Once | ORAL | Status: AC
  Administered 2016-12-05: 324 mg via ORAL
  Filled 2016-12-05: qty 4

## 2016-12-05 MED ORDER — ISOSORBIDE MONONITRATE ER 60 MG PO TB24
60.0000 mg | ORAL_TABLET | Freq: Every day | ORAL | Status: DC
  Administered 2016-12-06: 60 mg via ORAL
  Filled 2016-12-05: qty 1

## 2016-12-05 MED ORDER — INSULIN ASPART 100 UNIT/ML ~~LOC~~ SOLN
5.0000 [IU] | Freq: Once | SUBCUTANEOUS | Status: AC
  Administered 2016-12-05: 5 [IU] via SUBCUTANEOUS
  Filled 2016-12-05: qty 1

## 2016-12-05 MED ORDER — ACETAMINOPHEN 325 MG PO TABS: 650.0000 mg | ORAL_TABLET | ORAL | Status: DC | PRN

## 2016-12-05 MED ORDER — WARFARIN - PHARMACIST DOSING INPATIENT: Freq: Every day | Status: DC

## 2016-12-05 MED ORDER — ISOSORBIDE MONONITRATE ER 60 MG PO TB24: 60.0000 mg | ORAL_TABLET | Freq: Every day | ORAL | Status: DC

## 2016-12-05 MED ORDER — CLOPIDOGREL BISULFATE 75 MG PO TABS
75.0000 mg | ORAL_TABLET | Freq: Every day | ORAL | Status: DC
  Administered 2016-12-06: 75 mg via ORAL
  Filled 2016-12-05: qty 1

## 2016-12-05 MED ORDER — ATORVASTATIN CALCIUM 80 MG PO TABS
80.0000 mg | ORAL_TABLET | Freq: Every day | ORAL | Status: DC
  Administered 2016-12-05: 80 mg via ORAL
  Filled 2016-12-05: qty 1

## 2016-12-05 MED ORDER — DEXTROSE 10 % IV SOLN
Freq: Once | INTRAVENOUS | Status: AC
  Administered 2016-12-05: 13:00:00 via INTRAVENOUS

## 2016-12-05 MED ORDER — WARFARIN SODIUM 10 MG PO TABS: 10.0000 mg | ORAL_TABLET | ORAL | Status: DC

## 2016-12-05 MED ORDER — SODIUM CHLORIDE 0.9 % IV SOLN
INTRAVENOUS | Status: AC
  Administered 2016-12-05: 19:00:00 via INTRAVENOUS

## 2016-12-05 MED ORDER — INSULIN ASPART 100 UNIT/ML ~~LOC~~ SOLN
0.0000 [IU] | Freq: Three times a day (TID) | SUBCUTANEOUS | Status: DC
  Administered 2016-12-06: 3 [IU] via SUBCUTANEOUS

## 2016-12-05 MED ORDER — HEPARIN BOLUS VIA INFUSION
2000.0000 [IU] | Freq: Once | INTRAVENOUS | Status: AC
  Administered 2016-12-05: 2000 [IU] via INTRAVENOUS
  Filled 2016-12-05: qty 2000

## 2016-12-05 MED ORDER — NITROGLYCERIN 0.4 MG SL SUBL: 0.4000 mg | SUBLINGUAL_TABLET | SUBLINGUAL | Status: DC | PRN

## 2016-12-05 MED ORDER — NITROGLYCERIN 0.4 MG SL SUBL
0.4000 mg | SUBLINGUAL_TABLET | SUBLINGUAL | Status: DC | PRN
Start: 2016-12-05 — End: 2016-12-06

## 2016-12-05 MED ORDER — SODIUM CHLORIDE 0.9 % IV BOLUS (SEPSIS)
500.0000 mL | Freq: Once | INTRAVENOUS | Status: AC
  Administered 2016-12-05: 500 mL via INTRAVENOUS

## 2016-12-05 NOTE — Telephone Encounter (Signed)
I was speaking with pt's son, Arcadio-the call become disconnected.

## 2016-12-05 NOTE — ED Provider Notes (Signed)
[facility] DEPT Provider Note   CSN: [id number] Arrival date & time: 12/05/16  1027     History   Chief Complaint Chief Complaint  Patient presents with  . Chest Pain    HPI [name] is a 51 y.o. male.  HPI  51 year old male presents with chest pain. He states he went to work this morning and while at work at around 6 AM he started having discomfort in his left chest. Was not severe but was at the level of his last 2 heart attacks. He also had some nausea without vomiting. No diaphoresis or shortness of breath. Took a nitroglycerin. Overall the pain lasted about 2 hours and is gone. Has been pain-free for a couple hours. Just recently admitted and discharged after having 2 caths and a stent placed. Recently had his furosemide changed to torsemide and was placed on Plavix. Otherwise no new medicines. Chronically on digoxin and warfarin. Otherwise denies any leg swelling or leg pain. No other recent illness.  Past Medical History:  Diagnosis Date  . Angina   . ASCVD (arteriosclerotic cardiovascular disease)    , Anterior infarction 2005, LAD diagonal bifurcation intervention 03/2004  . Automatic implantable cardiac defibrillator -[employer]       . Benign neoplasm of colon   . Chronic systolic heart failure ([place])   . Coronary artery disease     Widely patent previously placed stents in the left anterior   . Crohn's disease ([place])   . 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 Gastroparesis   . GERD (gastroesophageal reflux disease)   . High cholesterol   . Hyperlipidemia   . Hypersomnolent    Previous diagnosis of narcolepsy  . Hypertension, essential   . Ischemic cardiomyopathy    Ejection fraction 15-20% catheterization 2010  . Type II or unspecified type diabetes mellitus without mention of complication, not stated as uncontrolled   . Unspecified gastritis and gastroduodenitis without mention of hemorrhage     Patient Active Problem List   Diagnosis Date Noted  . Acute systolic CHF (congestive heart failure) ([place]) 11/23/2016  . AKI (acute kidney injury) ([place])   . Non-ST elevation (NSTEMI) myocardial infarction ([place])   . Acute on chronic systolic heart failure ([place]) 11/18/2016  . CHF (congestive heart failure) ([place]) 09/02/2016  . ACS (acute coronary syndrome) ([place])   . Type 2 diabetes mellitus without complication, with long-term current use of insulin ([place])   . Elevated serum creatinine   . Acute on chronic systolic CHF (congestive heart failure), NYHA class 4 ([place]) 08/20/2016  . Encounter for therapeutic drug monitoring 08/03/2016  . Chest pain 04/26/2012  . SVT (supraventricular tachycardia) ([place]) 04/26/2012  . Gastroparesis due to DM ([place]) 11/18/2011  . Nausea and vomiting in adult 11/17/2011  . Coronary artery disease   . Ischemic cardiomyopathy   . Essential hypertension   . Automatic implantable cardioverter-defibrillator in situ   . Deep venous thrombosis ([place])   . Hypersomnolent   . POLYP, COLON 09/25/2008  . DM 09/25/2008  . Dyslipidemia, goal LDL below 70 09/25/2008  . GASTRITIS 09/25/2008  . DIVERTICULOSIS, COLON 09/25/2008  . RECTAL MASS 09/25/2008    Past Surgical History:  Procedure Laterality Date  . APPENDECTOMY    . CARDIAC DEFIBRILLATOR PLACEMENT  2010   St. Jude ICD  . COLECTOMY     "for Crohn's"  . COLON SURGERY    . CORONARY STENT INTERVENTION N/A 11/25/2016   Procedure: Coronary Stent Intervention;  Surgeon: Leonie Man, MD;  Location: [facility];  Service: Cardiovascular;  Laterality: N/A;  . EP IMPLANTABLE DEVICE N/A 09/14/2016   Procedure: ICD Generator Changeout;  Surgeon: Deboraha Sprang, MD;  Location: [facility];  Service: Cardiovascular;  Laterality: N/A;  . FETAL SURGERY FOR CONGENITAL HERNIA    . INGUINAL HERNIA REPAIR    . RIGHT/LEFT HEART CATH AND CORONARY ANGIOGRAPHY N/A 11/23/2016   Procedure: Right/Left Heart Cath and Coronary Angiography;  Surgeon: Larey Dresser, MD;  Location: [facility];  Service: Cardiovascular;  Laterality: N/A;       Home Medications    Prior to Admission medications   Medication Sig Start Date End Date Taking? Authorizing Provider  aspirin 81 MG tablet Take 1 tablet (81 mg total) by mouth daily. 11/28/16 12/28/16  Shirley Friar, PA-C  atorvastatin (LIPITOR) 80 MG tablet Take 1 tablet (80 mg total) by mouth daily at 6 PM. 11/28/16   Shirley Friar, PA-C  clopidogrel (PLAVIX) 75 MG tablet Take 1 tablet (75 mg total) by mouth daily. 11/29/16   Shirley Friar, PA-C  digoxin (LANOXIN) 0.125 MG tablet Take 1 tablet (0.125 mg total) by mouth daily. 11/28/16   Shirley Friar, PA-C  hydrALAZINE (APRESOLINE) 50 MG tablet Take 1 tablet (50 mg total) by mouth 3 (three) times daily. 11/28/16   Shirley Friar, PA-C  insulin glargine (LANTUS) 100 UNIT/ML injection Inject 0.3 mLs (30 Units total) into the skin every morning. Sliding scale. 09/02/16   Hillary Corinda Gubler, MD  isosorbide mononitrate (IMDUR) 60 MG 24 hr tablet Take 1 tablet (60 mg total) by mouth daily. 11/28/16   Shirley Friar, PA-C  nitroGLYCERIN (NITROSTAT) 0.4 MG SL tablet Place 1 tablet (0.4 mg total) under the tongue every 5 (five) minutes as needed for chest pain. 07/13/16   Jerline Pain, MD  spironolactone (ALDACTONE) 25 MG tablet Take 0.5 tablets (12.5 mg total) by mouth daily. 11/28/16   Shirley Friar, PA-C  torsemide (DEMADEX) 20 MG tablet Take 40 mg (2 tablets) every am and 20 mg (tablet) every pm 11/28/16   Shirley Friar, PA-C  warfarin (COUMADIN) 5 MG tablet Take 10 mg (2 tablets) 11/29/16, and then return to previous home dosing of 5 mg daily except for 7.5 mg ( 1 and 1 half tablets) on Fridays. 11/29/16   Shirley Friar, PA-C    Family History Family History  Problem Relation Age of Onset  . Heart attack Father   . Stroke Paternal Grandfather     Social History Social  History  Substance Use Topics  . Smoking status: Never Smoker  . Smokeless tobacco: Never Used  . Alcohol use No     Allergies   Metformin and related   Review of Systems Review of Systems  Respiratory: Negative for shortness of breath.   Cardiovascular: Positive for chest pain.  Gastrointestinal: Positive for nausea. Negative for abdominal pain and vomiting.  All other systems reviewed and are negative.    Physical Exam Updated Vital Signs BP 130/82 (BP Location: Right Arm)   Pulse 82   Temp 98.3 F (36.8 C) (Oral)   Resp 18   SpO2 99%   Physical Exam  Constitutional: He is oriented to person, place, and time. He appears well-developed and well-nourished. No distress.  HENT:  Head: Normocephalic and atraumatic.  Right Ear: External ear normal.  Left Ear: External ear normal.  Nose: Nose normal.  Eyes: Right eye exhibits no discharge. Left eye exhibits no discharge.  Neck: Neck supple.  Cardiovascular: Normal rate, regular rhythm and normal heart sounds.   Pulmonary/Chest: Effort normal and breath sounds normal. He has no wheezes. He has no rales. He exhibits no tenderness.  Left sided ICD in place  Abdominal: Soft. There is no tenderness.  Musculoskeletal: He exhibits no edema.  Neurological: He is alert and oriented to person, place, and time.  Skin: Skin is warm and dry. He is not diaphoretic.  Nursing note and vitals reviewed.    ED Treatments / Results  Labs (all labs ordered are listed, but only abnormal results are displayed) Labs Reviewed  BASIC METABOLIC PANEL - Abnormal; Notable for the following:       Result Value   Sodium 131 (*)    Potassium 6.1 (*)    Chloride 95 (*)    Glucose, Bld 178 (*)    BUN 85 (*)    Creatinine, Ser 2.10 (*)    GFR calc non Af Amer 35 (*)    GFR calc Af Amer 40 (*)    All other components within normal limits  CBC - Abnormal; Notable for the following:    MCV 76.3 (*)    MCH 25.0 (*)    RDW 16.4 (*)     Platelets 485 (*)    All other components within normal limits  I-STAT TROPOININ, ED - Abnormal; Notable for the following:    Troponin i, poc 0.10 (*)    All other components within normal limits  TROPONIN I  PROTIME-INR  DIGOXIN LEVEL  GLUCOSE, RANDOM    EKG  EKG Interpretation  Date/Time:  Monday December 05 2016 10:32:45 EDT Ventricular Rate:  88 PR Interval:  182 QRS Duration: 86 QT Interval:  350 QTC Calculation: 423 R Axis:   87 Text Interpretation:  Normal sinus rhythm Low voltage QRS Septal infarct , age undetermined Abnormal ECG T wave changes stable no significant change since Nov 26 2016 Confirmed by Regenia Skeeter MD, Davionte Lusby [phone]) on 12/05/2016 11:13:34 AM       Radiology Dg Chest 2 View  Result Date: 12/05/2016 CLINICAL DATA:  Midline chest discomfort associated with nausea and vomiting since 7 a.m. today. History of diabetes, hypertension, cardiac dysrhythmia with implantable defibrillator. EXAM: CHEST  2 VIEW COMPARISON:  PA and lateral chest x-ray of November 09, 2016 FINDINGS: The lungs are reasonably well inflated and clear. The heart and pulmonary vascularity are normal. The mediastinum is normal in width. The ICD is in stable position. The bony thorax exhibits no acute abnormality. IMPRESSION: There is no pneumonia, CHF, nor other acute cardiopulmonary abnormality. Electronically Signed   By: David  [place]ique M.D.   On: 12/05/2016 10:53    Procedures Procedures (including critical care time)  Medications Ordered in ED Medications  sodium chloride 0.9 % bolus 500 mL (not administered)  insulin aspart (novoLOG) injection 5 Units (not administered)  dextrose 10 % infusion (not administered)     Initial Impression / Assessment and Plan / ED Course  I have reviewed the triage vital signs and the nursing notes.  Pertinent labs & imaging results that were available during my care of the patient were reviewed by me and considered in my medical decision making (see chart for  details).  Clinical Course as of Dec 06 1199  Mon Dec 05, 2016  1159 Add on digoxin level given renal failure and hyperkalemia. 500 cc bolus as there is no evidence of  HF at this time. CP free. ASA. Insulin/glucose. No ECG changes to suggest severe hyperkalemia  [SG]    Clinical Course User Index [SG] Sherwood Gambler, MD    Patient is currently asymptomatic. Unclear if mild troponin elevation is from new ischemia or downtrending from recent MI (more likely). Doubt PE, dissection. New AKI probably related to 2 recent caths. No evidence of digoxin toxicity. Does not appear to have acute CHF, will give gentle fluids, insulin/glucose. Admit to cardiology.   Final Clinical Impressions(s) / ED Diagnoses   Final diagnoses:  Acute kidney injury ([place])  Hyperkalemia    New Prescriptions New Prescriptions   No medications on file     Sherwood Gambler, MD 12/05/16 1844

## 2016-12-05 NOTE — Telephone Encounter (Signed)
Called and spoke with pt who reports he did have "chest diccomfort" in the middle of his chest with nausea.  He denies any radiation with the discomfort, no SOB, no GI disturbances other than nausea which remains.  The chest discomfort resolved after taking 1 SL Ntg.  He has not taken anything for the nausea.  He also complains of fatigue and is concerned that something is going on with his recent stent.  Advised if he is having continued symptoms he should report to the closest ED for further evaluation.  He stated understanding.

## 2016-12-05 NOTE — Discharge Instructions (Addendum)

## 2016-12-05 NOTE — Progress Notes (Addendum)
ANTICOAGULATION CONSULT NOTE - Initial Consult  Pharmacy Consult:  Coumadin Indication:  Recurrent DVTs  Allergies  Allergen Reactions  . Metformin And Related Diarrhea    Patient Measurements: Height = 68 inches Weight = 82.3 kg Heparin dosing weight = 82 kg  Vital Signs: Temp: 98.5 F (36.9 C) (04/02 1743) Temp Source: Oral (04/02 1743) BP: 129/68 (04/02 1743) Pulse Rate: 98 (04/02 1743)  Labs:  Recent Labs  12/05/16 1100 12/05/16 1202  HGB 14.0  --   HCT 42.7  --   PLT 485*  --   LABPROT  --  18.1*  INR  --  1.48  CREATININE 2.10*  --   TROPONINI  --  0.11*    Estimated Creatinine Clearance: 43.1 mL/min (A) (by C-G formula based on SCr of 2.1 mg/dL (H)).   Medical History: Past Medical History:  Diagnosis Date  . Angina   . ASCVD (arteriosclerotic cardiovascular disease)    , Anterior infarction 2005, LAD diagonal bifurcation intervention 03/2004  . Automatic implantable cardiac defibrillator -St. Jude's       . Benign neoplasm of colon   . Chronic systolic heart failure ([place])   . Coronary artery disease     Widely patent previously placed stents in the left anterior   . Crohn's disease ([place])   . 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 Gastroparesis   . GERD (gastroesophageal reflux disease)   . High cholesterol   . Hyperlipidemia   . Hypersomnolent    Previous diagnosis of narcolepsy  . Hypertension, essential   . Ischemic cardiomyopathy    Ejection fraction 15-20% catheterization 2010  . Type II or unspecified type diabetes mellitus without mention of complication, not stated as uncontrolled   . Unspecified gastritis and gastroduodenitis without mention of hemorrhage       Assessment: 81 YOM presented with chest tightness.  Pharmacy consulted to continue Coumadin from PTA for history of recurrent DVTs.  Per [facility] record, patient's INR was 1.5 on 12/02/16 and his regimen was changed to 87m daily except 7.542mon Mon and Thurs.   Today's INR is sub-therapeutic at 1.48.  Per patient, he last took Coumadin this morning.  No bleeding reported.   Goal of Therapy:  INR 2-3    Plan:  - Hold Coumadin today - Daily PT / INR - Monitor closely for s/sx of bleeding (also on ASA and Plavix)    Jennifer Holland D. DaMina MarblePharmD, BCPS Pager:  [phone]/10/2016, 6:01 PM   ====================================   Addendum: - add heparin bridge until INR is therapeutic - Reduced heparin 2000 units IV bolus x 1, then - Heparin gtt at 1200 units/hr - Check 6 hr heparin level - Daily heparin level and CBC    Khayree Delellis D. DaMina MarblePharmD, BCPS Pager:  [phone]/10/2016, 6:19 PM

## 2016-12-05 NOTE — Telephone Encounter (Signed)
Per daughter calling - pt reported to her he has been having CP since 3 am and HX of recent stent placement.  She is unable to answer any other questions r/t his CP as she is not with him.  He has not tried using his SL Ntg.  Advised to use the NTG and if the CP is not relieved bu the time he uses the 3 rd one to call 911 and report to the ED for further evaluation.  She states understanding.

## 2016-12-05 NOTE — Telephone Encounter (Signed)
Pt c/o of Chest Pain: STAT if CP now or developed within 24 hours  1. Are you having CP right now? yes 2. Are you experiencing any other symptoms (ex. SOB, nausea, vomiting, sweating)? nausea  3. How long have you been experiencing CP? 12/05/2016 @ 3am  4. Is your CP continuous or coming and going?  coming and going  5. Have you taken Nitroglycerin? no ?

## 2016-12-05 NOTE — H&P (Signed)
Patient ID: [name] MRN: [id number], DOB/AGE: 1966/02/28   Admit date: 12/05/2016   Primary Physician: Darci Needle, MD Primary Cardiologist: Dr. Marlou Porch CHF: Dr. Aundra Dubin EP: Dr. Caryl Comes  Asked to see re chest tightness  Pt. Profile:  [name] is a 51 y.o. male with a history of chronic systolic heart failure, diabetes, hypertension, ischemic cardiomyopathy ejection fraction 25-30%, ICD, on chronic Coumadin therapy secondary to prior DVT and CAD who presented to [facility] ED for evaluation of chest pain.   HPI: As above. Recently admitted 3/16-3/26 for acute on chronic systolic heart failure. He became hypotensive 3/18 with SBP in 80s, poor UOP. Co-ox sent, was 36.5% initially with CVP 20+. Started on norepinephrine + milrinone up to 0.375 mcg/kg/min. Diuresed 10.9 L and down 26 lbs. Pressors weaned as tolerated. ECG is somewhat different with increased T wave inversions in lateral leads compared to prior ECGs. Troponin increased up to 8. Cardiac cath 11/23/16 with Normal filling pressures after diuresis and preserved cardiac output ON milrinone.   Coronary angiography with 99% ulcerate lesion proximal RCA with left to right collaterals with 95% mid LAD stenosis after mid LAD stent. Full report as below. He had successful stage PCI of mLAD and prox RCA on 5/78 without complication. He was discharged stably on CHF meds.   He has lost 6 pounds since discharge on home scale (180-->174lb). Compliant with diet. No chest pain, shortness of breath, dizziness, orthopnea, PND, lower extremity edema. He went to work first time today where he had a episode of "chest discomfort ".  Tightness  Mild  He did not have severe pain with MI  Associated with nausea and followed by vomiting. This pain is similar to her prior presentation. Pt did not  try any sublingual nitroglycerin. Pain was intermittent and came to ER for further evaluation. Troponin I of 0.11. (Symptoms trending down from  last admission). Potassium 6.1 (given insulin and now on dextrose solution). Serum creatinine 2.1 which has worsened from the day of discharge 1.38. CXR without pneumonia, CHF, nor other acute cardiopulmonary abnormality. EKG shows sinus rhythm at a rate of 88 bpm and T-wave inversion in lead V6. Compared to EKG of 3/24 which shows resolution of  T-wave inversion in lateral leads. Personal leads.   Problem List  Past Medical History:  Diagnosis Date  . Angina   . ASCVD (arteriosclerotic cardiovascular disease)    , Anterior infarction 2005, LAD diagonal bifurcation intervention 03/2004  . Automatic implantable cardiac defibrillator -[employer]. Jude[employer]       . Benign neoplasm of colon   . Chronic systolic heart failure ([place])   . Coronary artery disease     Widely patent previously placed stents in the left anterior   . Crohn's disease ([place])   . 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 Gastroparesis   . GERD (gastroesophageal reflux disease)   . High cholesterol   . Hyperlipidemia   . Hypersomnolent    Previous diagnosis of narcolepsy  . Hypertension, essential   . Ischemic cardiomyopathy    Ejection fraction 15-20% catheterization 2010  . Type II or unspecified type diabetes mellitus without mention of complication, not stated as uncontrolled   . Unspecified gastritis and gastroduodenitis without mention of hemorrhage     Past Surgical History:  Procedure Laterality Date  . APPENDECTOMY    . CARDIAC DEFIBRILLATOR PLACEMENT  2010   St. Jude ICD  . COLECTOMY     "  for Crohn's"  . COLON SURGERY    . CORONARY STENT INTERVENTION N/A 11/25/2016   Procedure: Coronary Stent Intervention;  Surgeon: Leonie Man, MD;  Location: [facility] [facility];  Service: Cardiovascular;  Laterality: N/A;  . EP IMPLANTABLE DEVICE N/A 09/14/2016   Procedure: ICD Generator Changeout;  Surgeon: Deboraha Sprang, MD;  Location: [place] [facility];  Service: Cardiovascular;  Laterality: N/A;  .  FETAL SURGERY FOR CONGENITAL HERNIA    . INGUINAL HERNIA REPAIR    . RIGHT/LEFT HEART CATH AND CORONARY ANGIOGRAPHY N/A 11/23/2016   Procedure: Right/Left Heart Cath and Coronary Angiography;  Surgeon: Larey Dresser, MD;  Location: [place] [facility];  Service: Cardiovascular;  Laterality: N/A;     Allergies  Allergies  Allergen Reactions  . Metformin And Related Diarrhea     Home Medications  Prior to Admission medications   Medication Sig Start Date End Date Taking? Authorizing Provider  aspirin 81 MG tablet Take 1 tablet (81 mg total) by mouth daily. 11/28/16 12/28/16  Shirley Friar, PA-C  atorvastatin (LIPITOR) 80 MG tablet Take 1 tablet (80 mg total) by mouth daily at 6 PM. 11/28/16   Shirley Friar, PA-C  clopidogrel (PLAVIX) 75 MG tablet Take 1 tablet (75 mg total) by mouth daily. 11/29/16   Shirley Friar, PA-C  digoxin (LANOXIN) 0.125 MG tablet Take 1 tablet (0.125 mg total) by mouth daily. 11/28/16   Shirley Friar, PA-C  hydrALAZINE (APRESOLINE) 50 MG tablet Take 1 tablet (50 mg total) by mouth 3 (three) times daily. 11/28/16   Shirley Friar, PA-C  insulin glargine (LANTUS) 100 UNIT/ML injection Inject 0.3 mLs (30 Units total) into the skin every morning. Sliding scale. 09/02/16   Hillary Corinda Gubler, MD  isosorbide mononitrate (IMDUR) 60 MG 24 hr tablet Take 1 tablet (60 mg total) by mouth daily. 11/28/16   Shirley Friar, PA-C  nitroGLYCERIN (NITROSTAT) 0.4 MG SL tablet Place 1 tablet (0.4 mg total) under the tongue every 5 (five) minutes as needed for chest pain. 07/13/16   Jerline Pain, MD  spironolactone (ALDACTONE) 25 MG tablet Take 0.5 tablets (12.5 mg total) by mouth daily. 11/28/16   Shirley Friar, PA-C  torsemide (DEMADEX) 20 MG tablet Take 40 mg (2 tablets) every am and 20 mg (tablet) every pm 11/28/16   Shirley Friar, PA-C  warfarin (COUMADIN) 5 MG tablet Take 10 mg (2 tablets) 11/29/16, and then return  to previous home dosing of 5 mg daily except for 7.5 mg ( 1 and 1 half tablets) on Fridays. 11/29/16   Shirley Friar, PA-C    Family History  Family History  Problem Relation Age of Onset  . Heart attack Father   . Stroke Paternal Grandfather    Family Status  Relation Status  . Mother Alive  . Father Deceased  . Paternal Grandfather Deceased  . Maternal Grandmother Deceased  . Maternal Grandfather Deceased  . Paternal Grandmother Deceased    Social History  Social History   Social History  . Marital status: Married    Spouse name: N/A  . Number of children: N/A  . Years of education: N/A   Occupational History  . Not on file.   Social History Main Topics  . Smoking status: Never Smoker  . Smokeless tobacco: Never Used  . Alcohol use No  . Drug use: No  . Sexual activity: No   Other Topics Concern  . Not on file  Social History Narrative  . No narrative on file     All other systems reviewed and are otherwise negative except as noted above.  Physical Exam  Blood pressure 130/88, pulse 82, temperature 98.3 F (36.8 C), temperature source Oral, resp. rate 19, SpO2 99 %.  General: Pleasant, NAD Psych: Normal affect. Neuro: Alert and oriented X 3. Moves all extremities spontaneously. HEENT: Normal  Neck: Supple without bruits or JVD. Lungs:  Resp regular and unlabored, CTA. Heart: RRR no s3, s4, or murmurs. Abdomen: Soft, non-tender, non-distended, BS + x 4.  Extremities: No clubbing, cyanosis or edema. DP/PT/Radials 2+ and equal bilaterally.  Labs   Recent Labs  12/05/16 1202  TROPONINI 0.11*   Lab Results  Component Value Date   WBC 9.4 12/05/2016   HGB 14.0 12/05/2016   HCT 42.7 12/05/2016   MCV 76.3 (L) 12/05/2016   PLT 485 (H) 12/05/2016     Recent Labs Lab 12/05/16 1100  NA 131*  K 6.1*  CL 95*  CO2 25  BUN 85*  CREATININE 2.10*  CALCIUM 9.5  GLUCOSE 178*   Lab Results  Component Value Date   CHOL 160 11/20/2016     HDL 39 (L) 11/20/2016   LDLCALC 99 11/20/2016   TRIG 111 11/20/2016   No results found for: DDIMER   Radiology/Studies  Dg Chest 2 View  Result Date: 12/05/2016 CLINICAL DATA:  Midline chest discomfort associated with nausea and vomiting since 7 a.m. today. History of diabetes, hypertension, cardiac dysrhythmia with implantable defibrillator. EXAM: CHEST  2 VIEW COMPARISON:  PA and lateral chest x-ray of November 09, 2016 FINDINGS: The lungs are reasonably well inflated and clear. The heart and pulmonary vascularity are normal. The mediastinum is normal in width. The ICD is in stable position. The bony thorax exhibits no acute abnormality. IMPRESSION: There is no pneumonia, CHF, nor other acute cardiopulmonary abnormality. Electronically Signed   By: David  Martinique M.D.   On: 12/05/2016 10:53   Dg Chest 2 View  Result Date: 11/09/2016 CLINICAL DATA:  Fatigue. Dyspnea on exertion and lower abdominal pain. EXAM: CHEST  2 VIEW COMPARISON:  08/20/2016 FINDINGS: There is a left chest wall pacer device with lead in the right atrial appendage and right ventricle. The heart size is enlarged. There is asymmetric elevation of the right hemidiaphragm. There is a moderate right pleural effusion which appears new from previous exam. Pulmonary vascular congestion is identified. IMPRESSION: 1. Moderate right pleural effusion and pulmonary vascular congestion. 2. Cardiac enlargement. Electronically Signed   By: Kerby Moors M.D.   On: 11/09/2016 16:35    ECG 11/22/16 Study Conclusions  - Left ventricle: The cavity size was normal. Wall thickness was   normal. Systolic function was severely reduced. The estimated   ejection fraction was in the range of 25% to 30%. Severe   hypokinesis of the mid-apicalanteroseptal, inferolateral, and   apical myocardium. Features are consistent with a pseudonormal   left ventricular filling pattern, with concomitant abnormal   relaxation and increased filling pressure (grade  2 diastolic   dysfunction). - Mitral valve: Valve area by continuity equation (using LVOT   flow): 1.49 cm^2. - Right atrium: The atrium was mildly dilated. - Tricuspid valve: There was mild-moderate regurgitation. - Pulmonary arteries: Systolic pressure was moderately increased.   PA peak pressure: 55 mm Hg (S).  Right/Left Heart Cath and Coronary Angiography  11/23/16  Conclusion   1. Normal filling pressures after diuresis, preserved cardiac output on milrinone.  2.  99% ulcerated lesion proximal RCA with left to right collaterals.  95% mid LAD stenosis after mid LAD stent.    Discussed films with Dr. Burt Knack.  Patient was admitted with cardiogenic shock, now stabilized on milrinone.  EF very low.  CABG would be very high risk.  Plan at this time for PCI to RCA and LAD on Friday.  Will load Plavix (will need to be on coumadin after discharge).  Watch creatinine carefully with contrast load, gentle hydration given normal filling pressures.    Coronary Stent Intervention 11/25/16  Conclusion     LESION #1: Dist LAD-1 lesion, 95 %stenosed.  A STENT SYNERGY DES 2.75X28 drug eluting stent was successfully placed, and overlaps previously placed stent.  Post intervention, there is a 0% residual stenosis.  LESION #2: Prox RCA lesion, 95 %stenosed.  A STENT SYNERGY DES 3.0X32 drug eluting stent was successfully placed,  Post intervention, there is a 0% residual stenosis.  -------------- Additional Findings -------------------  Colon Flattery 1st Diag to 1st Diag lesion, 80 %stenosed. 1st Diag CYPHER DES 2.5 X 13, 0 %stenosed.  Ost 2nd Diag to 2nd Diag CYPHER DES 2.5 X 13 & 2.5 X 8, 0 %stenosed.  Mid LAD CYPHER DES 3.0 X 13, 0 %stenosed.  Dist-apical LAD-2 lesion, 55 %stenosed.  Ost Ramus lesion, 80 %stenosed.  Prox Cx lesion, 70 %stenosed. 3rd Mrg lesion, 50 %stenosed.  Mid RCA lesion, 60 %stenosed. Dist RCA lesion, 60 %stenosed. - Borderline significance  Ost RPDA lesion, 60  %stenosed. 3rd RPLB lesion, 60 %stenosed. Too distal for PCI   Successful staged PCI of distal LAD and proximal RCA lesions with DES stents.  The plan is to treat the residual disease distally in the RCA with medical management.  He has already been loaded on Plavix.  Plan:  Return to CCU for ongoing care per heart failure service.  TR band removal per protocol  Would continue with Plavix essentially lifelong based on the extent of existing disease and stents  Gentle post-cath hydration per heart failure service      ASSESSMENT AND PLAN  1. Chest pain - Similar to his last presentation.  Self liited   EKG with resolved T-wave inversion in lateral leads. Troponin I 0.11 seems trending down from last admission from 8.23. Continue to trend enzyme. Chest pain free currently.   2. CAD s/p DES distal LAD and proximal RCA lesions with DES stents 11/25/16 - Continue aspirin, Plavix, Imdur, Lipitor.  3. Chronic systolic CHF - EF of 91-47%. He lost 6 pounds since discharge (180-->174lb). Compliant with low sodium diet. He actually looks dry on exam. He currently takes 40 mg of torsemide in a.m. and 20 mg at night--> will hold. Hold digoxin and spironolactone given AKI and hyperkalemia. Will give 500cc of NS.   4. Hyperkalemia - K of 6.1. Given Insulin and now on D10. Hold Spironolactone. Repeat BMET in AM.   5. Recurrent DVT - On coumadin  6. AKI - Scr 2.1 today. AT discharge it was 1.38. Hold torsemide, digoxin and spironolactone. Check BMET in AM.   Signed, Bhagat,Bhavinkumar, PA-C 12/05/2016, 2:11 PM Pager [phone]  Pt seen and examined  I agree with findingas as noted above by B Bhagat Pt is a 51 yo with recent admit with MI  Underwent DES to LAD Today had mild chest discomfort  Similar to previous  REsolved on own ON exam  Pt curr symptom free Appears comfortable laying flat Neck  JVP normal  LUng  Are CTA  Cardiac RRR  No S3  Abd benign  Extrem wihout edema   EKG  wihout acute changes    Labs signif for trop 0.11  K 6.1  Cr 2.1    Would admit  Hydrate  Follow electrolytes closely  COntinue to follow tropinin.  Dorris Carnes

## 2016-12-05 NOTE — ED Triage Notes (Signed)
Pt states he began having chest pain with nausea and vomiting this morning around 0700. Pt had MI with stent placement last week. Pt in no distress, skin warm and dry.

## 2016-12-06 ENCOUNTER — Other Ambulatory Visit: Payer: Self-pay | Admitting: Physician Assistant

## 2016-12-06 DIAGNOSIS — I5022 Chronic systolic (congestive) heart failure: Secondary | ICD-10-CM | POA: Diagnosis not present

## 2016-12-06 DIAGNOSIS — E875 Hyperkalemia: Secondary | ICD-10-CM

## 2016-12-06 DIAGNOSIS — R079 Chest pain, unspecified: Secondary | ICD-10-CM | POA: Diagnosis not present

## 2016-12-06 LAB — CBC
HEMATOCRIT: 37.8 % — AB (ref 39.0–52.0)
HEMOGLOBIN: 12.9 g/dL — AB (ref 13.0–17.0)
MCH: 25.9 pg — ABNORMAL LOW (ref 26.0–34.0)
MCHC: 34.1 g/dL (ref 30.0–36.0)
MCV: 75.8 fL — ABNORMAL LOW (ref 78.0–100.0)
Platelets: 457 10*3/uL — ABNORMAL HIGH (ref 150–400)
RBC: 4.99 MIL/uL (ref 4.22–5.81)
RDW: 16.5 % — ABNORMAL HIGH (ref 11.5–15.5)
WBC: 9.2 10*3/uL (ref 4.0–10.5)

## 2016-12-06 LAB — BASIC METABOLIC PANEL
Anion gap: 12 (ref 5–15)
BUN: 84 mg/dL — AB (ref 6–20)
CHLORIDE: 98 mmol/L — AB (ref 101–111)
CO2: 25 mmol/L (ref 22–32)
Calcium: 9.4 mg/dL (ref 8.9–10.3)
Creatinine, Ser: 2.08 mg/dL — ABNORMAL HIGH (ref 0.61–1.24)
GFR calc Af Amer: 40 mL/min — ABNORMAL LOW (ref >60)
GFR, EST NON AFRICAN AMERICAN: 35 mL/min — AB (ref >60)
GLUCOSE: 142 mg/dL — AB (ref 65–99)
POTASSIUM: 5.1 mmol/L (ref 3.5–5.1)
Sodium: 135 mmol/L (ref 135–145)

## 2016-12-06 LAB — HEPARIN LEVEL (UNFRACTIONATED): Heparin Unfractionated: 1.16 IU/mL — ABNORMAL HIGH (ref 0.30–0.70)

## 2016-12-06 LAB — TROPONIN I
TROPONIN I: 0.09 ng/mL — AB (ref <0.03)
TROPONIN I: 0.28 ng/mL — AB (ref <0.03)

## 2016-12-06 LAB — GLUCOSE, CAPILLARY
GLUCOSE-CAPILLARY: 95 mg/dL (ref 65–99)
Glucose-Capillary: 172 mg/dL — ABNORMAL HIGH (ref 65–99)

## 2016-12-06 LAB — PROTIME-INR
INR: 1.59
PROTHROMBIN TIME: 19.1 s — AB (ref 11.4–15.2)

## 2016-12-06 MED ORDER — WARFARIN SODIUM 7.5 MG PO TABS: ORAL_TABLET | ORAL | 0 refills | Status: DC

## 2016-12-06 MED ORDER — WARFARIN SODIUM 7.5 MG PO TABS
7.5000 mg | ORAL_TABLET | Freq: Once | ORAL | Status: AC
  Administered 2016-12-06: 7.5 mg via ORAL
  Filled 2016-12-06: qty 1

## 2016-12-06 MED ORDER — HEPARIN BOLUS VIA INFUSION
2000.0000 [IU] | Freq: Once | INTRAVENOUS | Status: AC
  Administered 2016-12-06: 2000 [IU] via INTRAVENOUS
  Filled 2016-12-06: qty 2000

## 2016-12-06 NOTE — Progress Notes (Signed)
Progress Note  Patient Name: [name] Date of Encounter: 12/06/2016  Primary Cardiologist: Dr. Marlou Porch CHF: Dr. Aundra Dubin EP: Dr. Caryl Comes  Subjective   No further episode of chest pain. No dyspnea or palpitations.   Inpatient Medications    Scheduled Meds: . aspirin  81 mg Oral Daily  . atorvastatin  80 mg Oral q1800  . clopidogrel  75 mg Oral Daily  . hydrALAZINE  50 mg Oral TID  . insulin aspart  0-15 Units Subcutaneous TID WC  . isosorbide mononitrate  60 mg Oral Daily  . warfarin  7.5 mg Oral ONCE-1800  . Warfarin - Pharmacist Dosing Inpatient   Does not apply q1800   Continuous Infusions: . heparin 1,450 Units/hr (12/06/16 0239)   PRN Meds: acetaminophen, nitroGLYCERIN, ondansetron (ZOFRAN) IV   Vital Signs    Vitals:   12/05/16 1645 12/05/16 1743 12/05/16 2100 12/06/16 0350  BP: 121/73 129/68 115/72 107/62  Pulse: 92 98 88 86  Resp: 17 15 16    Temp:  98.5 F (36.9 C) 98.3 F (36.8 C) 97.5 F (36.4 C)  TempSrc:  Oral Oral Oral  SpO2: 95% 100% 100% 100%  Weight:    171 lb 9.6 oz (77.8 kg)    Intake/Output Summary (Last 24 hours) at 12/06/16 1001 Last data filed at 12/06/16 0826  Gross per 24 hour  Intake           603.68 ml  Output                0 ml  Net           603.68 ml   Filed Weights   12/06/16 0350  Weight: 171 lb 9.6 oz (77.8 kg)    Telemetry    NSR with PVCs - Personally Reviewed  ECG    None today   Physical Exam   GEN: No acute distress.   Neck: No JVD Cardiac: RRR, no murmurs, rubs, or gallops. Trace edema on lower extremity Respiratory: Clear to auscultation bilaterally. GI: Soft, nontender, non-distended  MS: No edema; No deformity. Neuro:  Nonfocal  Psych: Normal affect   Labs    Chemistry Recent Labs Lab 12/05/16 1100 12/05/16 1416 12/05/16 1552 12/06/16 0108  NA 131*  --   --  135  K 6.1*  --  4.8 5.1  CL 95*  --   --  98*  CO2 25  --   --  25  GLUCOSE 178* 122*  --  142*  BUN 85*  --   --  84*    CREATININE 2.10*  --   --  2.08*  CALCIUM 9.5  --   --  9.4  GFRNONAA 35*  --   --  35*  GFRAA 40*  --   --  40*  ANIONGAP 11  --   --  12     Hematology Recent Labs Lab 12/05/16 1100 12/06/16 0108  WBC 9.4 9.2  RBC 5.60 4.99  HGB 14.0 12.9*  HCT 42.7 37.8*  MCV 76.3* 75.8*  MCH 25.0* 25.9*  MCHC 32.8 34.1  RDW 16.4* 16.5*  PLT 485* 457*    Cardiac Enzymes Recent Labs Lab 12/05/16 1202 12/06/16 0432  TROPONINI 0.11* 0.09*    Recent Labs Lab 12/05/16 1051  TROPIPOC 0.10*     BNPNo results for input(s): BNP, PROBNP in the last 168 hours.   DDimer No results for input(s): DDIMER in the last 168 hours.   Radiology  Dg Chest 2 View  Result Date: 12/05/2016 CLINICAL DATA:  Midline chest discomfort associated with nausea and vomiting since 7 a.m. today. History of diabetes, hypertension, cardiac dysrhythmia with implantable defibrillator. EXAM: CHEST  2 VIEW COMPARISON:  PA and lateral chest x-ray of November 09, 2016 FINDINGS: The lungs are reasonably well inflated and clear. The heart and pulmonary vascularity are normal. The mediastinum is normal in width. The ICD is in stable position. The bony thorax exhibits no acute abnormality. IMPRESSION: There is no pneumonia, CHF, nor other acute cardiopulmonary abnormality. Electronically Signed   By: David  Martinique M.D.   On: 12/05/2016 10:53    Cardiac Studies   Echo 11/22/16 Study Conclusions  - Left ventricle: The cavity size was normal. Wall thickness was normal. Systolic function was severely reduced. The estimated ejection fraction was in the range of 25% to 30%. Severe hypokinesis of the mid-apicalanteroseptal, inferolateral, and apical myocardium. Features are consistent with a pseudonormal left ventricular filling pattern, with concomitant abnormal relaxation and increased filling pressure (grade 2 diastolic dysfunction). - Mitral valve: Valve area by continuity equation (using LVOT flow): 1.49  cm^2. - Right atrium: The atrium was mildly dilated. - Tricuspid valve: There was mild-moderate regurgitation. - Pulmonary arteries: Systolic pressure was moderately increased. PA peak pressure: 55 mm Hg (S).  Right/Left Heart Cath and Coronary Angiography  11/23/16  Conclusion   1. Normal filling pressures after diuresis, preserved cardiac output on milrinone.  2. 99% ulcerated lesion proximal RCA with left to right collaterals. 95% mid LAD stenosis after mid LAD stent.   Discussed films with Dr. Burt Knack. Patient was admitted with cardiogenic shock, now stabilized on milrinone. EF very low. CABG would be very high risk. Plan at this time for PCI to RCA and LAD on Friday. Will load Plavix (will need to be on coumadin after discharge). Watch creatinine carefully with contrast load, gentle hydration given normal filling pressures.    Coronary Stent Intervention 11/25/16  Conclusion     LESION #1: Dist LAD-1 lesion, 95 %stenosed.  A STENT SYNERGY DES 2.75X28 drug eluting stent was successfully placed, and overlaps previously placed stent.  Post intervention, there is a 0% residual stenosis.  LESION #2: Prox RCA lesion, 95 %stenosed.  A STENT SYNERGY DES 3.0X32 drug eluting stent was successfully placed,  Post intervention, there is a 0% residual stenosis.  -------------- Additional Findings -------------------  Colon Flattery 1st Diag to 1st Diag lesion, 80 %stenosed. 1st Diag CYPHER DES 2.5 X 13, 0 %stenosed.  Ost 2nd Diag to 2nd Diag CYPHER DES 2.5 X 13 & 2.5 X 8, 0 %stenosed.  Mid LAD CYPHER DES 3.0 X 13, 0 %stenosed.  Dist-apical LAD-2 lesion, 55 %stenosed.  Ost Ramus lesion, 80 %stenosed.  Prox Cx lesion, 70 %stenosed. 3rd Mrg lesion, 50 %stenosed.  Mid RCA lesion, 60 %stenosed. Dist RCA lesion, 60 %stenosed. - Borderline significance  Ost RPDA lesion, 60 %stenosed. 3rd RPLB lesion, 60 %stenosed. Too distal for PCI  Successful staged PCI of distal LAD and  proximal RCA lesions with DES stents.  The plan is to treat the residual disease distally in the RCA with medical management.  He has already been loaded on Plavix.  Plan:  Return to CCU for ongoing care per heart failure service.  TR band removal per protocol  Would continue with Plavix essentially lifelong based on the extent of existing disease and stents  Gentle post-cath hydration per heart failure service     Patient Profile  [name] is a 51 y.o. male with a history of chronic systolic heart failure, diabetes, hypertension, ischemic cardiomyopathy ejection fraction 25-30%, ICD, on chronic Coumadin therapy secondary to prior DVT and CAD who presented to [facility] ED for evaluation of chest pain.   Assessment & Plan    1. Chest pain - Similar to his last presentation.  Self liited.   EKG with resolved T-wave inversion in lateral leads. Troponin trending down from last admission. No further episode of chest pain.   2. CAD s/p DES distal LAD and proximal RCA lesions with DES stents 11/25/16 - Continue aspirin, Plavix, Imdur, Lipitor.  3. Chronic systolic CHF - EF of 94-76%. He lost 6 pounds since discharge (180-->174lb). Compliant with low sodium diet.  - He actually looks dry on admission and his  40 mg of torsemide in a.m. and 20 mg at night--> hold. Give 500cc of NS. No dyspnea or orthopnea. Trace edema today on exam. Resumption of diuretics per MD.   4. Hyperkalemia - K of 6.1 at admission. Given Insulin and D10. Held Spironolactone. K 5.1 this AM.   5. Recurrent DVT - On coumadin at home. INR was sub-therapeutic yesterday and started on IV heparin per pharmacy.   6. AKI - Scr 2.1 on admission.  AT discharge it was 1.38. Held torsemide, digoxin and spironolactone. Scr of 2.08 today.  Dispo: medication management per MD. Patient has appointment with Dr. Aundra Dubin 4/10.  Signed, Leanor Kail, PA  12/06/2016, 10:01 AM    Pt seen and examined  I agree  with findings as noted above by B Bhagat  Pt is comfortable  ON exam :  Lungs CTA   Cardiac RRR  N oS3  Ext without edema Labs signif for K of 5.1  Trop continues to trend down  I would continue medical Rx  Hold aldactone   If pt ambulates and is OK he can be discharged with close outpt f/u.  Dorris Carnes

## 2016-12-06 NOTE — Progress Notes (Signed)
ICM remote transmission rescheduled from 12/06/2016 to 12/20/2016 due to patient currently hospitalized.

## 2016-12-06 NOTE — Progress Notes (Signed)
ANTICOAGULATION CONSULT NOTE  Pharmacy Consult:  Heparin Indication:   h/o DVT  Allergies  Allergen Reactions  . Metformin And Related Diarrhea    Patient Measurements: Height = 68 inches Weight = 82.3 kg Heparin dosing weight = 82 kg  Vital Signs: Temp: 98.3 F (36.8 C) (04/02 2100) Temp Source: Oral (04/02 2100) BP: 115/72 (04/02 2100) Pulse Rate: 88 (04/02 2100)  Labs:  Recent Labs  12/05/16 1100 12/05/16 1202 12/06/16 0108  HGB 14.0  --  12.9*  HCT 42.7  --  37.8*  PLT 485*  --  457*  LABPROT  --  18.1* 19.1*  INR  --  1.48 1.59  HEPARINUNFRC  --   --  <0.10*  CREATININE 2.10*  --  2.08*  TROPONINI  --  0.11*  --     Estimated Creatinine Clearance: 43.5 mL/min (A) (by C-G formula based on SCr of 2.08 mg/dL (H)).  Assessment: 51 y.o. male with h/o DVT and subtherapeutic INR for anticoagulation . Goal of Therapy: Heparin level 0.3-0.7 INR 2-3  Plan:  Heparin 2000 units IV bolus, then increase heparin  1450 units/hr Check heparin level in 8 hours.  Coumadin 7.5 mg today  Phillis Knack, PharmD, BCPS

## 2016-12-06 NOTE — Discharge Summary (Signed)
Discharge Summary    Patient ID: [name],  MRN: [id number], DOB/AGE: 1966-03-06 51 y.o.  Admit date: 12/05/2016 Discharge date: 12/06/2016  Primary Care Provider: Darci Needle Primary Cardiologist: Dr. Marlou Porch CHF: Dr. Aundra Dubin EP: Dr. Caryl Comes   Discharge Diagnoses    Active Problems:   Chest pain   CAD   Hyperkalemia   Chronic systolic CHF    Recurrent DVT   AKI  Allergies Allergies  Allergen Reactions  . Metformin And Related Diarrhea    Diagnostic Studies/Procedures    None   History of Present Illness     [name] is a 51 y.o. male with a history of chronic systolic heart failure, diabetes, hypertension, ischemic cardiomyopathy ejection fraction 25-30%, ICD, on chronic Coumadin therapy secondary to prior DVT and CAD who presented to [facility] ED for evaluation of chest pain 12/05/16.   Recently admitted 3/16-3/26 for acute on chronic systolic heart failure. He became hypotensive 3/18 with SBP in 80s, poor UOP. Co-ox sent, was 36.5% initially with CVP 20+. Started on norepinephrine + milrinone up to 0.375 mcg/kg/min. Diuresed 10.9 L and down 26 lbs. Pressors weaned as tolerated. ECG is somewhat different with increased T wave inversions in lateral leads compared to prior ECGs. Troponin increased up to 8. Cardiac cath 11/23/16 with Normal filling pressures after diuresis and preserved cardiac output ON milrinone. Coronary angiography with 99% ulcerate lesion proximal RCA with left to right collaterals with 95% mid LAD stenosis after mid LAD stent. Full report as below. He had successful stage PCI of mLAD and prox RCA on 3/83 without complication. He was discharged stably on CHF meds.   He has lost 6 pounds since discharge on home scale (180-->174lb). Compliant with diet. No  shortness of breath, dizziness, orthopnea, PND, lower extremity edema. He went to work first time 12/05/16 where he had a episode of "chest discomfort ".  Had mild Tightness.  He did  not have severe pain with MI.  It was associated with nausea and followed by vomiting. This pain is similar to her prior presentation. Pt did not  try any sublingual nitroglycerin. Pain was intermittent and came to ER for further evaluation. Troponin I of 0.11. (Symptoms trending down from last admission). Potassium 6.1 (given insulin and now on dextrose solution). Serum creatinine 2.1 which has worsened from the day of discharge 1.38. CXR without pneumonia, CHF, nor other acute cardiopulmonary abnormality. EKG shows sinus rhythm at a rate of 88 bpm and T-wave inversion in lead V6. Compared to EKG of 3/24 which shows resolution of  T-wave inversion in lateral leads. Personal leads.    Hospital Course     Consultants: None  1. Chest pain - Similar to his last presentation. Self liited. EKG with resolved T-wave inversion in lateral leads. Troponin trending down from last admission. No further episode of chest pain. No intervention done.   2. CAD s/p DESdistal LAD and proximal RCA lesions with DES stents 11/25/16 - Continue aspirin, Plavix, Imdur, Lipitor.  3. Chronic systolic CHF - EF of 33-83%. He lost 6 pounds since discharge (180-->174lb). Compliant with low sodium diet.  - He actually dry on admission and his  40 mg of torsemide in a.m. and 20 mg at night-->hold. Give 500cc of NS. No dyspnea or orthopnea. Resume home diuretics at discharge.   4. Hyperkalemia - K of 6.1 at admission. Given Insulin and D10. Held Spironolactone. K 5.1 at discharge.   5. Recurrent DVT - On  coumadin at home. INR was sub-therapeutic yesterday and started on IV heparin per pharmacy. Discussed with pharmacist Skeet Simmer, [facility]), pt will go home on higher dose of coumadin as noted below with close follow up (12/08/16). Dr. Harrington Challenger did not think that patient will need Levenox bridge.   6. AKI - Scr 2.1 on admission.  AT prior discharge it was 1.38. Will continue to hold digoxin and spironolactone. Scr of 2.08  today.  Dispo: INR and BEMT check on 12/08/16.   The patient has been seen by Dr. Harrington Challenger  today and deemed ready for discharge home. All follow-up appointments have been scheduled. Discharge medications are listed below.  _____________   Discharge Vitals Blood pressure 107/62, pulse 86, temperature 97.5 F (36.4 C), temperature source Oral, resp. rate 16, height 5' 8"  (1.727 m), weight 171 lb 9.6 oz (77.8 kg), SpO2 100 %.  Filed Weights   12/06/16 0350  Weight: 171 lb 9.6 oz (77.8 kg)    Labs & Radiologic Studies     CBC  Recent Labs  12/05/16 1100 12/06/16 0108  WBC 9.4 9.2  HGB 14.0 12.9*  HCT 42.7 37.8*  MCV 76.3* 75.8*  PLT 485* 841*   Basic Metabolic Panel  Recent Labs  12/05/16 1100 12/05/16 1416 12/05/16 1552 12/06/16 0108  NA 131*  --   --  135  K 6.1*  --  4.8 5.1  CL 95*  --   --  98*  CO2 25  --   --  25  GLUCOSE 178* 122*  --  142*  BUN 85*  --   --  84*  CREATININE 2.10*  --   --  2.08*  CALCIUM 9.5  --   --  9.4   Liver Function Tests No results for input(s): AST, ALT, ALKPHOS, BILITOT, PROT, ALBUMIN in the last 72 hours. No results for input(s): LIPASE, AMYLASE in the last 72 hours. Cardiac Enzymes  Recent Labs  12/05/16 1202 12/06/16 0432 12/06/16 0952  TROPONINI 0.11* 0.09* 0.28*   BNP Invalid input(s): POCBNP D-Dimer No results for input(s): DDIMER in the last 72 hours. Hemoglobin A1C No results for input(s): HGBA1C in the last 72 hours. Fasting Lipid Panel No results for input(s): CHOL, HDL, LDLCALC, TRIG, CHOLHDL, LDLDIRECT in the last 72 hours. Thyroid Function Tests No results for input(s): TSH, T4TOTAL, T3FREE, THYROIDAB in the last 72 hours.  Invalid input(s): FREET3  Dg Chest 2 View  Result Date: 12/05/2016 CLINICAL DATA:  Midline chest discomfort associated with nausea and vomiting since 7 a.m. today. History of diabetes, hypertension, cardiac dysrhythmia with implantable defibrillator. EXAM: CHEST  2 VIEW COMPARISON:  PA  and lateral chest x-ray of November 09, 2016 FINDINGS: The lungs are reasonably well inflated and clear. The heart and pulmonary vascularity are normal. The mediastinum is normal in width. The ICD is in stable position. The bony thorax exhibits no acute abnormality. IMPRESSION: There is no pneumonia, CHF, nor other acute cardiopulmonary abnormality. Electronically Signed   By: David  Martinique M.D.   On: 12/05/2016 10:53   Dg Chest 2 View  Result Date: 11/09/2016 CLINICAL DATA:  Fatigue. Dyspnea on exertion and lower abdominal pain. EXAM: CHEST  2 VIEW COMPARISON:  08/20/2016 FINDINGS: There is a left chest wall pacer device with lead in the right atrial appendage and right ventricle. The heart size is enlarged. There is asymmetric elevation of the right hemidiaphragm. There is a moderate right pleural effusion which appears new from previous exam. Pulmonary vascular  congestion is identified. IMPRESSION: 1. Moderate right pleural effusion and pulmonary vascular congestion. 2. Cardiac enlargement. Electronically Signed   By: Kerby Moors M.D.   On: 11/09/2016 16:35    Disposition   Pt is being discharged home today in good condition.  Follow-up Plans & Appointments    Follow-up Information    [facility]. Go on 12/08/2016.   Specialty:  Cardiology Why:  @ 2pm for INR and BMET Contact information: [address], [address] [place]       Loralie Champagne, MD. Go on 12/13/2016.   Specialty:  Cardiology Why:  @10 :30 as scheulded for hospital follow up Contact information: [place] [address] [phone]          Discharge Instructions    Diet - low sodium heart healthy    Complete by:  As directed    Discharge instructions    Complete by:  As directed    Take coumadin 7.67m today and tomorrow. INR and BMET check on Thursday.   Increase activity slowly    Complete by:  As directed        Discharge Medications   Current Discharge Medication List    CONTINUE these medications which have CHANGED   Details  warfarin (COUMADIN) 7.5 MG tablet Take 7.528mtoday (12/06/16)  and tomorrow (12/07/16) --> further direction after INR check on Thursday 12/08/16 Qty: 30 tablet, Refills: 0      CONTINUE these medications which have NOT CHANGED   Details  aspirin 81 MG tablet Take 1 tablet (81 mg total) by mouth daily. Qty: 30 tablet, Refills: 0    atorvastatin (LIPITOR) 80 MG tablet Take 1 tablet (80 mg total) by mouth daily at 6 PM. Qty: 30 tablet, Refills: 6    clopidogrel (PLAVIX) 75 MG tablet Take 1 tablet (75 mg total) by mouth daily. Qty: 30 tablet, Refills: 6    hydrALAZINE (APRESOLINE) 50 MG tablet Take 1 tablet (50 mg total) by mouth 3 (three) times daily. Qty: 90 tablet, Refills: 6    insulin glargine (LANTUS) 100 UNIT/ML injection Inject 0.3 mLs (30 Units total) into the skin every morning. Sliding scale. Qty: 10 mL, Refills: 11   Associated Diagnoses: Type 2 diabetes mellitus with complication, with long-term current use of insulin (HCC)    isosorbide mononitrate (IMDUR) 60 MG 24 hr tablet Take 1 tablet (60 mg total) by mouth daily. Qty: 30 tablet, Refills: 6    nitroGLYCERIN (NITROSTAT) 0.4 MG SL tablet Place 1 tablet (0.4 mg total) under the tongue every 5 (five) minutes as needed for chest pain. Qty: 25 tablet, Refills: 3    torsemide (DEMADEX) 20 MG tablet Take 40 mg (2 tablets) every am and 20 mg (tablet) every pm Qty: 90 tablet, Refills: 6      STOP taking these medications     digoxin (LANOXIN) 0.125 MG tablet      spironolactone (ALDACTONE) 25 MG tablet          Outstanding Labs/Studies   BMET and INR check  Thursday  Duration of Discharge Encounter   Greater than 30 minutes including physician time.  Signed, Cledis Sohn PA-C 12/06/2016, 1:06 PM

## 2016-12-06 NOTE — Progress Notes (Signed)
ANTICOAGULATION CONSULT NOTE - Follow Up Consult  Pharmacy Consult for  Heparin and Coumadin Indication: hx recurrent DVT  Allergies  Allergen Reactions  . Metformin And Related Diarrhea    Patient Measurements: Height: 5' 8"  (172.7 cm) Weight: 171 lb 9.6 oz (77.8 kg) IBW/kg (Calculated) : 68.4 Heparin Dosing Weight: 77.8 kg  Vital Signs: Temp: 97.5 F (36.4 C) (04/03 0350) Temp Source: Oral (04/03 0350) BP: 107/62 (04/03 0350) Pulse Rate: 86 (04/03 0350)  Labs:  Recent Labs  12/05/16 1100 12/05/16 1202 12/06/16 0108 12/06/16 0432 12/06/16 0952  HGB 14.0  --  12.9*  --   --   HCT 42.7  --  37.8*  --   --   PLT 485*  --  457*  --   --   LABPROT  --  18.1* 19.1*  --   --   INR  --  1.48 1.59  --   --   HEPARINUNFRC  --   --  <0.10*  --  1.16*  CREATININE 2.10*  --  2.08*  --   --   TROPONINI  --  0.11*  --  0.09* 0.28*    Estimated Creatinine Clearance: 40.2 mL/min (A) (by C-G formula based on SCr of 2.08 mg/dL (H)).  Assessment:   51 yr old male with hx recurrent DVTs on Heparin bridge while INR is subtherapeutic.  Initial heparin level was <0.1 on 1200 units/hr, received 2000 unit bolus and drip increased to 1450 units/hr.  Heparin level is now supratherapeutic at 1.16.  RN reports problem with IV site in R arm last night so new site obtained in L arm ~3am. Initial level may be falsely low.  Most recent sample drawn from opposite hand. INR 1.59.  Also on Plavix and ASA 81 mg after 2 stents placed 11/25/16.    Patient reports that he may go home on Lovenox later today.    Coumadin regimen adjusted 3/30 as outpatient when INR 1.5. 5 mg daily except 7.5 mg on Mondays and Fridays.  Goal of Therapy:  INR 2-3 Heparin level 0.3-0.7 units/ml Monitor platelets by anticoagulation protocol: Yes   Plan:   Decrease heparin drip to 1250 units/hr.  Heparin level ~6 hrs after rate change.  Coumadin 7.5 mg tonight as ordered.  Daily heparin level, PT/INR and CBC.  If he  goes home on Lovenox, could use 80 mg sq 12hrs or 120 mg sq daily.  Arty Baumgartner, [place] Pager: [phone] 12/06/2016,11:52 AM

## 2016-12-08 ENCOUNTER — Other Ambulatory Visit: Payer: BLUE CROSS/BLUE SHIELD | Admitting: *Deleted

## 2016-12-08 ENCOUNTER — Ambulatory Visit (INDEPENDENT_AMBULATORY_CARE_PROVIDER_SITE_OTHER): Payer: BLUE CROSS/BLUE SHIELD | Admitting: *Deleted

## 2016-12-08 DIAGNOSIS — E875 Hyperkalemia: Secondary | ICD-10-CM

## 2016-12-08 DIAGNOSIS — I82499 Acute embolism and thrombosis of other specified deep vein of unspecified lower extremity: Secondary | ICD-10-CM

## 2016-12-08 DIAGNOSIS — Z5181 Encounter for therapeutic drug level monitoring: Secondary | ICD-10-CM

## 2016-12-08 LAB — POCT INR: INR: 1.5

## 2016-12-09 ENCOUNTER — Telehealth (HOSPITAL_COMMUNITY): Payer: Self-pay | Admitting: *Deleted

## 2016-12-09 DIAGNOSIS — I5022 Chronic systolic (congestive) heart failure: Secondary | ICD-10-CM

## 2016-12-09 LAB — BASIC METABOLIC PANEL WITH GFR
BUN/Creatinine Ratio: 42 — ABNORMAL HIGH (ref 9–20)
BUN: 70 mg/dL — ABNORMAL HIGH (ref 6–24)
CO2: 25 mmol/L (ref 18–29)
Calcium: 9.4 mg/dL (ref 8.7–10.2)
Chloride: 97 mmol/L (ref 96–106)
Creatinine, Ser: 1.67 mg/dL — ABNORMAL HIGH (ref 0.76–1.27)
GFR calc Af Amer: 54 mL/min/1.73 — ABNORMAL LOW
GFR calc non Af Amer: 46 mL/min/1.73 — ABNORMAL LOW
Glucose: 311 mg/dL — ABNORMAL HIGH (ref 65–99)
Potassium: 5.7 mmol/L — ABNORMAL HIGH (ref 3.5–5.2)
Sodium: 138 mmol/L (ref 134–144)

## 2016-12-09 MED ORDER — TORSEMIDE 20 MG PO TABS: 40.0000 mg | ORAL_TABLET | Freq: Every day | ORAL | 3 refills | Status: DC

## 2016-12-09 NOTE — Telephone Encounter (Signed)
Basic Metabolic Panel (BMET)  Order: [id number]  Status:  Final result Visible to patient:  No (Not Released) Dx:  Hyperkalemia  Notes recorded by [name], RN on 12/09/2016 at 2:16 PM EDT Spoke with patient and he confirmed that he is not taking potassium or spironolactone. He is agreeable with holding torsemide for 2 days and restarting it at 40 mg Daily. He has an appointment with [place] next Tuesday when we can check labs. Medication changes made and BMET ordered. ------  Notes recorded by Larey Dresser, MD on 12/09/2016 at 1:32 PM EDT Hold torsemide x 2 days then decrease to 40 mg daily. Repeat BMET in 5 days. Make sure he is not on a K supplement or taking spironolactone. ------  Notes recorded by Jeanann Lewandowsky, RMA on 12/09/2016 at 9:07 AM EDT lmptcb jw 12/09/16 ------  Notes recorded by Leanor Kail, PA on 12/09/2016 at 9:00 AM EDT Scr improved. However K again 5.7. Make sure patient is not taking spironolactone which is discontinued during discharge. Give Kayexalate 76m qd for 2 day. Dr. MAundra Dubinto see patient 4/10 --> at that he will get another BMET.

## 2016-12-12 ENCOUNTER — Encounter (HOSPITAL_COMMUNITY): Payer: Self-pay

## 2016-12-12 NOTE — Progress Notes (Signed)
[place] medical record request received 12/01/2016. All records requested 3/15--present faxed to provided # [phone] (74 pages total). Copy of request scanned into patient's electronic medical record under media tab for reference.  Renee Pain, RN

## 2016-12-13 ENCOUNTER — Encounter (HOSPITAL_COMMUNITY): Payer: Self-pay

## 2016-12-13 ENCOUNTER — Telehealth (HOSPITAL_COMMUNITY): Payer: Self-pay | Admitting: *Deleted

## 2016-12-13 ENCOUNTER — Encounter (HOSPITAL_COMMUNITY): Payer: Self-pay | Admitting: *Deleted

## 2016-12-13 ENCOUNTER — Ambulatory Visit (HOSPITAL_COMMUNITY)
Admission: RE | Admit: 2016-12-13 | Discharge: 2016-12-13 | Disposition: A | Discharge status: Home / Self Care | Payer: BLUE CROSS/BLUE SHIELD | Source: Ambulatory Visit | Attending: Cardiology | Admitting: Cardiology

## 2016-12-13 VITALS — BP 129/69 | HR 93 | Wt 180.0 lb

## 2016-12-13 DIAGNOSIS — Z79899 Other long term (current) drug therapy: Secondary | ICD-10-CM | POA: Diagnosis not present

## 2016-12-13 DIAGNOSIS — I255 Ischemic cardiomyopathy: Secondary | ICD-10-CM | POA: Insufficient documentation

## 2016-12-13 DIAGNOSIS — Z7982 Long term (current) use of aspirin: Secondary | ICD-10-CM | POA: Diagnosis not present

## 2016-12-13 DIAGNOSIS — I130 Hypertensive heart and chronic kidney disease with heart failure and stage 1 through stage 4 chronic kidney disease, or unspecified chronic kidney disease: Secondary | ICD-10-CM | POA: Diagnosis not present

## 2016-12-13 DIAGNOSIS — I2510 Atherosclerotic heart disease of native coronary artery without angina pectoris: Secondary | ICD-10-CM | POA: Insufficient documentation

## 2016-12-13 DIAGNOSIS — I252 Old myocardial infarction: Secondary | ICD-10-CM | POA: Diagnosis not present

## 2016-12-13 DIAGNOSIS — Z7902 Long term (current) use of antithrombotics/antiplatelets: Secondary | ICD-10-CM | POA: Insufficient documentation

## 2016-12-13 DIAGNOSIS — Z794 Long term (current) use of insulin: Secondary | ICD-10-CM | POA: Diagnosis not present

## 2016-12-13 DIAGNOSIS — N183 Chronic kidney disease, stage 3 (moderate): Secondary | ICD-10-CM | POA: Insufficient documentation

## 2016-12-13 DIAGNOSIS — N179 Acute kidney failure, unspecified: Secondary | ICD-10-CM

## 2016-12-13 DIAGNOSIS — E1122 Type 2 diabetes mellitus with diabetic chronic kidney disease: Secondary | ICD-10-CM | POA: Insufficient documentation

## 2016-12-13 DIAGNOSIS — Z7901 Long term (current) use of anticoagulants: Secondary | ICD-10-CM | POA: Insufficient documentation

## 2016-12-13 DIAGNOSIS — I5022 Chronic systolic (congestive) heart failure: Secondary | ICD-10-CM | POA: Diagnosis not present

## 2016-12-13 LAB — BASIC METABOLIC PANEL
ANION GAP: 12 (ref 5–15)
BUN: 75 mg/dL — AB (ref 6–20)
CALCIUM: 9.5 mg/dL (ref 8.9–10.3)
CO2: 24 mmol/L (ref 22–32)
Chloride: 101 mmol/L (ref 101–111)
Creatinine, Ser: 1.95 mg/dL — ABNORMAL HIGH (ref 0.61–1.24)
GFR calc Af Amer: 44 mL/min — ABNORMAL LOW (ref >60)
GFR, EST NON AFRICAN AMERICAN: 38 mL/min — AB (ref >60)
Glucose, Bld: 146 mg/dL — ABNORMAL HIGH (ref 65–99)
POTASSIUM: 4.7 mmol/L (ref 3.5–5.1)
SODIUM: 137 mmol/L (ref 135–145)

## 2016-12-13 LAB — BRAIN NATRIURETIC PEPTIDE: B Natriuretic Peptide: 382.5 pg/mL — ABNORMAL HIGH (ref 0.0–100.0)

## 2016-12-13 LAB — CBC
HCT: 39.5 % (ref 39.0–52.0)
HEMOGLOBIN: 12.8 g/dL — AB (ref 13.0–17.0)
MCH: 24.9 pg — AB (ref 26.0–34.0)
MCHC: 32.4 g/dL (ref 30.0–36.0)
MCV: 76.8 fL — ABNORMAL LOW (ref 78.0–100.0)
PLATELETS: 348 10*3/uL (ref 150–400)
RBC: 5.14 MIL/uL (ref 4.22–5.81)
RDW: 16.5 % — AB (ref 11.5–15.5)
WBC: 7.4 10*3/uL (ref 4.0–10.5)

## 2016-12-13 MED ORDER — TORSEMIDE 20 MG PO TABS: ORAL_TABLET | ORAL | 3 refills | Status: DC

## 2016-12-13 MED ORDER — ISOSORBIDE MONONITRATE ER 60 MG PO TB24: 90.0000 mg | ORAL_TABLET | Freq: Every day | ORAL | 6 refills | Status: DC

## 2016-12-13 MED ORDER — HYDRALAZINE HCL 50 MG PO TABS: 75.0000 mg | ORAL_TABLET | Freq: Three times a day (TID) | ORAL | 6 refills | Status: DC

## 2016-12-13 MED ORDER — DIGOXIN 125 MCG PO TABS: 0.1250 mg | ORAL_TABLET | Freq: Every day | ORAL | 3 refills | Status: DC

## 2016-12-13 NOTE — Telephone Encounter (Signed)
Pt states he needs letter for work, he states he discussed it w/Dr Aundra Dubin earlier today but had to call back with specifics, he request letter be faxed to [facility] at [phone].  Advised will have Dr Aundra Dubin sign letter and fax it today, will leave copy at front desk for him to p/u later.

## 2016-12-13 NOTE — Progress Notes (Signed)
PCP: Dr. Ola Spurr HF Cardiology: Dr. Aundra Dubin  51 yo with history of CAD s/p anterior MI in 2010 and chronic systolic CHF (ischemic cardiomyopathy) was admitted in 3/18 with 10 days of increased dyspnea and nausea.  Patient had been stable until that time, taking Lasix 80 mg po bid. Noted that weight was rising and he was developing lower extremity edema that was spreading up to his abdomen.  He tried increasing Lasix to 80 mg tid but this did not help.  He had orthopnea.  No chest pain.  Symptoms and presentation consistent with CHF, but troponin found to be elevated at 5.11.  UOP initially poor with rise in creatinine to 2.6. He was found to have low output with co-ox 36% and was started on milrinone gtt with diuresis.  Volume status and creatinine gradually improved.  He eventually underwent cath with severe proximal RCA and mid LAD stenoses noted, these were treated with DES.   He was readmitted shortly after discharge with chest pain.  This was not thought to be ACS but he was found to be in AKI again with hyperkalemia.  Digoxin and spironolactone were stopped.  Torsemide was stopped for a couple of days.   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 He is working 10 hour days and finds them exhausting. He has a hard time making it through the full shift.  He is generally not short of breath walking on flat ground or walking up a flight of stairs.  He fatigues after walking about 30 minutes in [employer].  No orthopnea/PND.  Occasional atypical chest pain. No lightheadedness.    ECG (4/18, personally reviewed): NSR, old ASMI, anterolateral nonspecific T wave inversions.   Labs (4/18): K 5.7, creatinine 1.67  Corevue reviewed: Impedance rising suggesting that he is not volume overloaded.   PMH: 1. Chronic systolic CHF: Ischemic cardiomyopathy.  St Jude ICD.  - Echo (11/17) with EF 25-30%, moderate diastolic dysfunction, mild MR, mildly dilated RV with moderately decreased systolic function,  peak RV-RA gradient 48 mmHg.  - Echo (3/18) with EF 25-30%, wall motion abnormalities, normal RV size and systolic function.  - Admission 3/18 with cardiogenic shock and AKI, required milrinone gtt.  2. CAD: Anterior MI 2005, had bifurcation stenting LAD/diagonal.  Repeat cath 2010 with patent stents.  - NSTEMI 3/18: LHC with 99% ulcerated lesion proximal RCA, 95% mid LAD => PCI with DES to proximal RCA and mid LAD.  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  4. Crohns disease: History of colectomy.  5. GERD 6. Gastroparesis 7. HTN 8. Hyperlipidemia 9. Type II diabetes   Social History   Social History  . Marital status: Married    Spouse name: N/A  . Number of children: N/A  . Years of education: N/A   Occupational History  . Not on file.   Social History Main Topics  . Smoking status: Never Smoker  . Smokeless tobacco: Never Used  . Alcohol use No  . Drug use: No  . Sexual activity: No   Other Topics Concern  . Not on file   Social History Narrative  . No narrative on file   Family History  Problem Relation Age of Onset  . Heart attack Father   . Stroke Paternal Grandfather    ROS: All systems reviewed and negative except as per HPI.   Current Outpatient Prescriptions  Medication Sig Dispense Refill  . aspirin 81 MG tablet Take 1 tablet (81 mg total) by mouth  daily. 30 tablet 0  . atorvastatin (LIPITOR) 80 MG tablet Take 1 tablet (80 mg total) by mouth daily at 6 PM. 30 tablet 6  . clopidogrel (PLAVIX) 75 MG tablet Take 1 tablet (75 mg total) by mouth daily. 30 tablet 6  . hydrALAZINE (APRESOLINE) 50 MG tablet Take 1.5 tablets (75 mg total) by mouth 3 (three) times daily. 135 tablet 6  . insulin glargine (LANTUS) 100 UNIT/ML injection Inject 0.3 mLs (30 Units total) into the skin every morning. Sliding scale. (Patient taking differently: Inject 35 Units into the skin every morning. Sliding scale. ) 10 mL 11  . isosorbide mononitrate (IMDUR) 60 MG 24 hr tablet Take 1.5  tablets (90 mg total) by mouth daily. 45 tablet 6  . torsemide (DEMADEX) 20 MG tablet Take 40 mg (2 Tablets) Daily alternating with 20 mg (1 Tablet) Daily 45 tablet 3  . warfarin (COUMADIN) 7.5 MG tablet Take 7.55m today (12/06/16)  and tomorrow (12/07/16) --> further direction after INR check on Thursday 12/08/16 30 tablet 0  . digoxin (LANOXIN) 0.125 MG tablet Take 1 tablet (0.125 mg total) by mouth daily. 90 tablet 3  . nitroGLYCERIN (NITROSTAT) 0.4 MG SL tablet Place 1 tablet (0.4 mg total) under the tongue every 5 (five) minutes as needed for chest pain. (Patient not taking: Reported on 12/13/2016) 25 tablet 3   No current facility-administered medications for this encounter.    BP 129/69   Pulse 93   Wt 180 lb (81.6 kg)   SpO2 100%   BMI 27.37 kg/m  General: NAD Neck: No JVD, no thyromegaly or thyroid nodule.  Lungs: Clear to auscultation bilaterally with normal respiratory effort. CV: Nondisplaced PMI.  Heart regular S1/S2, no S3/S4, no murmur.  No peripheral edema.  No carotid bruit.  Normal pedal pulses.  Abdomen: Soft, nontender, no hepatosplenomegaly, no distention.  Skin: Intact without lesions or rashes.  Neurologic: Alert and oriented x 3.  Psych: Normal affect. Extremities: No clubbing or cyanosis.  HEENT: Normal.   Assessment/Plan: 1. Chronic systolic CHF: History of ischemic cardiomyopathy with 11/17 echo showing EF 25-30%.  He was admitted in 3/18 with cardiogenic shock and AKI in the setting of NSTEMI.  Echo showed stably low EF, 25-30%.  He had PCI to LAD and RCA.  He required milrinone and diuresis, milrinone was weaned off. He looks euvolemic by Corevue and exam.  NYHA class II-III symptoms, gets too fatigued to work a full 10 hours.  With rise in creatinine at 4/18 readmission, I suspect that he was overdiuresed.  - Decrease torsemide to 20 mg daily.  BMET today and again in 1 week.  - Restart digoxin, follow level closely (check in 1 week).   - Increase hydralazine to 75  mg tid and Imdur to 90 daily.  - No ARB/ACEI with recent AKI and suspect creatinine still above baseline, no spironolactone with recent hyperkalemia.   - Will try to add on low dose Coreg next visit.   2. CKD: Stage 3.  Check BMET today.  3. CAD: NSTEMI in 3/18. LHC with 99% ulcerated lesion proximal RCA with left to right collaterals, 95% mid LAD stenosis after mid LAD stent.  s/p PCI to RCA and LAD on 3/23.  - On Plavix and ASA.  Will stop ASA after 1 month given use of warfarin (4/23).  - Continue high dose statin.  - Refer to cardiac rehab.  4. h/o DVTs: Continue warfarin.

## 2016-12-13 NOTE — Patient Instructions (Signed)
Labs today (will call for abnormal results, otherwise no news is good news)  START taking Digoxin 0.125 mg (1 Tablet) Once Daily  Increase Hydralazine to 75 mg (1.5 Tablets) Three Times Daily  Take Torsemide 40 mg (2 Tablets) Once Daily, alternating 20 mg (1 Tablet) Once Daily  Increase Imdur to 90 mg (1.5 Tablet) Once Daily  You have been referred to Cardiac Rehab, they will contact you for initial appointment.  Follow up in 1 week with Dr. Aundra Dubin

## 2016-12-14 ENCOUNTER — Encounter (HOSPITAL_COMMUNITY): Payer: Self-pay

## 2016-12-14 ENCOUNTER — Ambulatory Visit (HOSPITAL_COMMUNITY)
Admission: RE | Admit: 2016-12-14 | Discharge: 2016-12-14 | Disposition: A | Discharge status: Home / Self Care | Payer: BLUE CROSS/BLUE SHIELD | Source: Ambulatory Visit | Attending: Cardiology | Admitting: Cardiology

## 2016-12-14 ENCOUNTER — Telehealth (HOSPITAL_COMMUNITY): Payer: Self-pay | Admitting: *Deleted

## 2016-12-14 ENCOUNTER — Ambulatory Visit (INDEPENDENT_AMBULATORY_CARE_PROVIDER_SITE_OTHER): Payer: BLUE CROSS/BLUE SHIELD | Admitting: Pharmacist

## 2016-12-14 VITALS — BP 118/70 | HR 51 | Wt 181.4 lb

## 2016-12-14 DIAGNOSIS — E1122 Type 2 diabetes mellitus with diabetic chronic kidney disease: Secondary | ICD-10-CM | POA: Diagnosis not present

## 2016-12-14 DIAGNOSIS — I5022 Chronic systolic (congestive) heart failure: Secondary | ICD-10-CM | POA: Diagnosis not present

## 2016-12-14 DIAGNOSIS — Z5181 Encounter for therapeutic drug level monitoring: Secondary | ICD-10-CM

## 2016-12-14 DIAGNOSIS — I2510 Atherosclerotic heart disease of native coronary artery without angina pectoris: Secondary | ICD-10-CM | POA: Insufficient documentation

## 2016-12-14 DIAGNOSIS — I10 Essential (primary) hypertension: Secondary | ICD-10-CM | POA: Diagnosis not present

## 2016-12-14 DIAGNOSIS — I252 Old myocardial infarction: Secondary | ICD-10-CM | POA: Insufficient documentation

## 2016-12-14 DIAGNOSIS — I82499 Acute embolism and thrombosis of other specified deep vein of unspecified lower extremity: Secondary | ICD-10-CM

## 2016-12-14 DIAGNOSIS — I2583 Coronary atherosclerosis due to lipid rich plaque: Secondary | ICD-10-CM

## 2016-12-14 DIAGNOSIS — Z79899 Other long term (current) drug therapy: Secondary | ICD-10-CM | POA: Insufficient documentation

## 2016-12-14 DIAGNOSIS — I130 Hypertensive heart and chronic kidney disease with heart failure and stage 1 through stage 4 chronic kidney disease, or unspecified chronic kidney disease: Secondary | ICD-10-CM | POA: Insufficient documentation

## 2016-12-14 DIAGNOSIS — Z7902 Long term (current) use of antithrombotics/antiplatelets: Secondary | ICD-10-CM | POA: Insufficient documentation

## 2016-12-14 DIAGNOSIS — Z794 Long term (current) use of insulin: Secondary | ICD-10-CM | POA: Diagnosis not present

## 2016-12-14 DIAGNOSIS — N183 Chronic kidney disease, stage 3 (moderate): Secondary | ICD-10-CM | POA: Diagnosis not present

## 2016-12-14 DIAGNOSIS — I255 Ischemic cardiomyopathy: Secondary | ICD-10-CM | POA: Insufficient documentation

## 2016-12-14 DIAGNOSIS — Z7901 Long term (current) use of anticoagulants: Secondary | ICD-10-CM | POA: Insufficient documentation

## 2016-12-14 DIAGNOSIS — Z7982 Long term (current) use of aspirin: Secondary | ICD-10-CM | POA: Diagnosis not present

## 2016-12-14 DIAGNOSIS — I208 Other forms of angina pectoris: Secondary | ICD-10-CM

## 2016-12-14 LAB — POCT INR: INR: 2.5

## 2016-12-14 MED ORDER — TORSEMIDE 20 MG PO TABS: 20.0000 mg | ORAL_TABLET | Freq: Every day | ORAL | 3 refills | Status: DC

## 2016-12-14 MED ORDER — RANOLAZINE ER 500 MG PO TB12: 500.0000 mg | ORAL_TABLET | Freq: Two times a day (BID) | ORAL | 3 refills | Status: DC

## 2016-12-14 MED ORDER — OMEPRAZOLE 20 MG PO CPDR: 20.0000 mg | DELAYED_RELEASE_CAPSULE | Freq: Every day | ORAL | 3 refills | Status: DC

## 2016-12-14 NOTE — Patient Instructions (Signed)
START taking Ranexa 500 mg (1 Tablet) Two times Daily  START taking Prilosec 20 mg (1 Tablet) Once Daily  Follow up with Dr. Aundra Dubin next week as scheduled.

## 2016-12-14 NOTE — Progress Notes (Signed)
PCP: Dr. Ola Spurr HF Cardiology: Dr. Aundra Dubin  51 yo with history of CAD s/p anterior MI in 2010 and chronic systolic CHF (ischemic cardiomyopathy) was admitted in 3/18 with 10 days of increased dyspnea and nausea.  Patient had been stable until that time, taking Lasix 80 mg po bid. Noted that weight was rising and he was developing lower extremity edema that was spreading up to his abdomen.  He tried increasing Lasix to 80 mg tid but this did not help.  He had orthopnea.  No chest pain.  Symptoms and presentation consistent with CHF, but troponin found to be elevated at 5.11.  UOP initially poor with rise in creatinine to 2.6. He was found to have low output with co-ox 36% and was started on milrinone gtt with diuresis.  Volume status and creatinine gradually improved.  He eventually underwent cath with severe proximal RCA and mid LAD stenoses noted, these were treated with DES.   He was readmitted shortly after discharge with chest pain.  This was not thought to be ACS but he was found to be in AKI again with hyperkalemia.  Digoxin and spironolactone were stopped.  Torsemide was stopped for a couple of days.   Presents today as an add on, he started feeling bad last night. This morning woke up and starting having chest pain and palpitations. Also felt slightly nauseous, no vomiting. Denies diaphoresis. Feels a burning sensation in the middle of his chest. He has had an MI in the past in 2005, he feels like his symptoms today are somewhat similar to his MI in 2005. He has been taking his ASA and Plavix with compliance.   ECG (4/18, personally reviewed): NSR, t wave inversion in the anteroseptal leads.   Labs (4/18): K 5.7, creatinine 1.67   PMH: 1. Chronic systolic CHF: Ischemic cardiomyopathy.  St Jude ICD.  - Echo (11/17) with EF 25-30%, moderate diastolic dysfunction, mild MR, mildly dilated RV with moderately decreased systolic function, peak RV-RA gradient 48 mmHg.  - Echo (3/18) with  EF 25-30%, wall motion abnormalities, normal RV size and systolic function.  - Admission 3/18 with cardiogenic shock and AKI, required milrinone gtt.  2. CAD: Anterior MI 2005, had bifurcation stenting LAD/diagonal.  Repeat cath 2010 with patent stents.  - NSTEMI 3/18: LHC with 99% ulcerated lesion proximal RCA, 95% mid LAD => PCI with DES to proximal RCA and mid LAD.  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  4. Crohns disease: History of colectomy.  5. GERD 6. Gastroparesis 7. HTN 8. Hyperlipidemia 9. Type II diabetes   Social History   Social History  . Marital status: Married    Spouse name: N/A  . Number of children: N/A  . Years of education: N/A   Occupational History  . Not on file.   Social History Main Topics  . Smoking status: Never Smoker  . Smokeless tobacco: Never Used  . Alcohol use No  . Drug use: No  . Sexual activity: No   Other Topics Concern  . Not on file   Social History Narrative  . No narrative on file   Family History  Problem Relation Age of Onset  . Heart attack Father   . Stroke Paternal Grandfather    ROS: All systems reviewed and negative except as per HPI.   Current Outpatient Prescriptions  Medication Sig Dispense Refill  . aspirin 81 MG tablet Take 1 tablet (81 mg total) by mouth daily. 30 tablet 0  .  atorvastatin (LIPITOR) 80 MG tablet Take 1 tablet (80 mg total) by mouth daily at 6 PM. 30 tablet 6  . clopidogrel (PLAVIX) 75 MG tablet Take 1 tablet (75 mg total) by mouth daily. 30 tablet 6  . digoxin (LANOXIN) 0.125 MG tablet Take 1 tablet (0.125 mg total) by mouth daily. 90 tablet 3  . hydrALAZINE (APRESOLINE) 50 MG tablet Take 1.5 tablets (75 mg total) by mouth 3 (three) times daily. 135 tablet 6  . insulin glargine (LANTUS) 100 UNIT/ML injection Inject 0.3 mLs (30 Units total) into the skin every morning. Sliding scale. (Patient taking differently: Inject 35 Units into the skin every morning. Sliding scale. ) 10 mL 11  . isosorbide  mononitrate (IMDUR) 60 MG 24 hr tablet Take 1.5 tablets (90 mg total) by mouth daily. 45 tablet 6  . torsemide (DEMADEX) 20 MG tablet Take 1 tablet (20 mg total) by mouth daily. 30 tablet 3  . warfarin (COUMADIN) 7.5 MG tablet Take 7.43m today (12/06/16)  and tomorrow (12/07/16) --> further direction after INR check on Thursday 12/08/16 30 tablet 0  . nitroGLYCERIN (NITROSTAT) 0.4 MG SL tablet Place 1 tablet (0.4 mg total) under the tongue every 5 (five) minutes as needed for chest pain. (Patient not taking: Reported on 12/13/2016) 25 tablet 3   No current facility-administered medications for this encounter.    BP 118/70 (BP Location: Left Arm, Patient Position: Sitting, Cuff Size: Normal)   Pulse (!) 51   Wt 181 lb 6.4 oz (82.3 kg)   SpO2 99%   BMI 27.58 kg/m  General: Well appearing male in NAD Neck: No JVD, no thyromegaly or thyroid nodule.  Lungs: Clear to auscultation.  CV: Nondisplaced PMI.  Heart regular S1/S2, no S3/S4, no murmur.  No peripheral edema.  No carotid bruit.  Normal pedal pulses.  Abdomen: Soft, nontender, no hepatosplenomegaly, no distention.  Skin: Intact without lesions or rashes.  Neurologic: Alert and oriented x 3.  Psych: Normal affect. Extremities: No clubbing or cyanosis.  HEENT: Normal.   Assessment/Plan: 1. Chronic systolic CHF: History of ischemic cardiomyopathy with 11/17 echo showing EF 25-30%.  He was admitted in 3/18 with cardiogenic shock and AKI in the setting of NSTEMI.  Echo showed stably low EF, 25-30%.  He had PCI to LAD and RCA.  He required milrinone and diuresis, milrinone was weaned off. He looks euvolemic by Corevue and exam.  NYHA class II-III symptoms, gets too fatigued to work a full 10 hours.  With rise in creatinine at 4/18 readmission, I suspect that he was overdiuresed.  - Continue torsemide to 20 mg daily.   - Continue digoxin, follow level closely (check in 1 week).   - Continue  hydralazine to 75 mg tid and Imdur to 90 daily.  - No  ARB/ACEI with recent AKI and suspect creatinine still above baseline, no spironolactone with recent hyperkalemia.   - Will try to add on low dose Coreg next visit.   2. CKD: Stage 3.   - Creatinine 1.95.  3. CAD: NSTEMI in 3/18. LHC with 99% ulcerated lesion proximal RCA with left to right collaterals, 95% mid LAD stenosis after mid LAD stent.  s/p PCI to RCA and LAD on 3/23. - having chest pain today, will start him on Ranexa and see if that helps with his angina. EKG not ischemic. Want to avoid re cathing him with CKD.   - Told him to not hesitate to call 911 if he experiences chest pain - He  does have SL Nitro at home, I told him to take a Nitro if he starts having any chest pain.  - Instructed him to take Prilosec 75m daily.  - On Plavix and ASA.  Will stop ASA after 1 month given use of warfarin (4/23).  - Continue high dose statin.  - Refer to cardiac rehab.  4. h/o DVTs: Continue warfarin.   Follow up next week with Dr. MAundra Dubinto see if the Ranexa and PPI have helped his angina.

## 2016-12-14 NOTE — Telephone Encounter (Signed)
Basic metabolic panel  Order: [id number]  Status:  Final result Visible to patient:  No (Not Released) Dx:  Chronic systolic congestive heart fai...  Notes recorded by [name], RN on 12/14/2016 at 8:57 AM EDT Called and spoke with patient. He had already taken his 40 mg dose this morning but will start taking 20 mg daily of torsemide. He has an appointment with [place] next week so we will recheck labs at that appointment. Medication updated in epic. ------  Notes recorded by Larey Dresser, MD on 12/13/2016 at 11:28 PM EDT Cut torsemide back to 20 mg daily, check BMET + digoxin level in 1 week.

## 2016-12-15 ENCOUNTER — Encounter (HOSPITAL_COMMUNITY): Payer: Self-pay | Admitting: *Deleted

## 2016-12-20 ENCOUNTER — Ambulatory Visit (INDEPENDENT_AMBULATORY_CARE_PROVIDER_SITE_OTHER): Payer: BLUE CROSS/BLUE SHIELD

## 2016-12-20 ENCOUNTER — Telehealth (HOSPITAL_COMMUNITY): Payer: Self-pay | Admitting: *Deleted

## 2016-12-20 ENCOUNTER — Telehealth: Payer: Self-pay | Admitting: Internal Medicine

## 2016-12-20 DIAGNOSIS — I5022 Chronic systolic (congestive) heart failure: Secondary | ICD-10-CM | POA: Diagnosis not present

## 2016-12-20 DIAGNOSIS — Z95810 Presence of automatic (implantable) cardiac defibrillator: Secondary | ICD-10-CM | POA: Diagnosis not present

## 2016-12-20 MED ORDER — TORSEMIDE 20 MG PO TABS: 40.0000 mg | ORAL_TABLET | Freq: Every day | ORAL | 3 refills | Status: DC

## 2016-12-20 NOTE — Telephone Encounter (Signed)
New message     Pt c/o Shortness Of Breath: STAT if SOB developed within the last 24 hours or pt is noticeably SOB on the phone  1. Are you currently SOB (can you hear that pt is SOB on the phone)? Family member calling   2. How long have you been experiencing SOB? Today   3. Are you SOB when sitting or when up moving around? both  4. Are you currently experiencing any other symptoms? vomitting and diarrhea , not feeling well

## 2016-12-20 NOTE — Telephone Encounter (Signed)
I spoke with patient's daughter, [name]. [name] states pt had diarrhea and nausea yesterday, pt has been short of breath at rest since about 3 AM this morning. [name] states pt is resting in chair now, not in acute distress.  [name] states pt has had similar symptoms in the past that were related to heart failure.   [name] confirmed pt is followed in [facility], has an appt there tomorrow.  I advised [name] to call [phone] to discuss symptoms and recommendations with them.

## 2016-12-20 NOTE — Progress Notes (Signed)
EPIC Encounter for ICM Monitoring  Patient Name: [name] is a 51 y.o. male Date: 12/20/2016 Primary Care Physican: Darci Needle, MD Primary Cardiologist: [facility] Electrophysiologist: Caryl Comes Dry Weight: 183 lbs yesterday      Heart Failure questions reviewed, pt said he is not feeling well today.  He has vomited 5-6 times in last 12 hours and diarrhea 3 times since 9:30 this morning.  Weight was 174 lbs at time of discharge and since then has gained 9 lbs.    He was hospitalized 12/05/2016 to 12/06/2016 for increase in Creatinine and Potassium.        Thoracic impedance abnormal suggesting fluid accumulation starting 12/11/2016.  Prescribed and confirmed dosage: Torsemide 20 mg 1 tablet daily (decreased since last hospitalization)  Labs: 12/13/2016 Creatinine 1.95, BUN 75, Potassium 4.7, Sodium 137, EGFR 38-44 12/08/2016 Creatinine 1.67, BUN 70, Potassium 5.7, Sodium 138, EGFR 46-54  12/06/2016 Creatinine 2.08, BUN 84, Potassium 5.1, Sodium 135, EGFR 35-40  12/05/2016 Creatinine 2.10, BUN 85, Potassium 6.1, Sodium 131, EGFR 35-40  11/28/2016 Creatinine 1.38, BUN 34, Potassium 4.8, Sodium 137, EGFR 57->60  11/27/2016 Creatinine 1.51, BUN 30, Potassium 5.1, Sodium 135, EGFR 51-60  11/26/2016 Creatinine 1.57, BUN 27, Potassium 5.2, Sodium 138, EGFR 49-57  11/25/2016 Creatinine 1.69, BUN 34, Potassium 4.8, Sodium 134, EGFR 45-52  11/24/2016 Creatinine 1.92, BUN 37, Potassium 4.9, Sodium 134, EGFR 38-45  11/23/2016 Creatinine 1.92, BUN 41, Potassium 4.5, Sodium 130, EGFR 38-45  11/22/2016 Creatinine 2.00, BUN 44, Potassium 4.8, Sodium 130, EGFR 37-42 11/21/2016 Creatinine 2.69, BUN 53, Potassium 4.3, Sodium 133, EGFR 26-30  11/20/2016 Creatinine 2.63, BUN 51, Potassium 4.9, Sodium 133, EGFR 26-30  11/19/2016 Creatinine 1.65, BUN 38, Potassium 4.8, Sodium 131, EGFR 46-54  11/18/2016 Creatinine 1.35, BUN 29, Potassium 4.2, Sodium 138, EGFR >60  11/09/2016 Creatinine  1.63, BUN 37, Potassium 4.7, Sodium 137  09/07/2016 Creatinine 1.47, BUN 34, Potassium 4.1, Sodium 140, EGFR 54-63   Recommendations: Copy of ICM check sent to Dr Aundra Dubin, Dr Marlou Porch and Dr Caryl Comes for review and recommendations.   Patient's daughter has already called HF clinic this am and was advised patient should go to ER if needed.  Follow-up plan: ICM clinic phone appointment on 01/03/2017 since patient has defib office appointment scheduled on 12/27/2013 with Dr Caryl Comes.    3 month ICM trend: 12/20/2016   1 Year ICM trend:     Rosalene Billings, RN 12/20/2016 8:10 AM

## 2016-12-20 NOTE — Progress Notes (Signed)
Increase torsemide to 40 mg daily now (take extra 20 mg this evening) and see me in the office tomorrow or the next day as work-in.

## 2016-12-20 NOTE — Telephone Encounter (Signed)
Pt's daughter called our clinic, stating pt is having SOB even at rest, n/v/diarrhea this AM.  She states he has felt to bad to weigh, yesterday he weighed 184, he usually runs 181 so maybe up a few lbs.  She states he usually feels nauseated in the AM but it passes and then he is fine, however today he has been much more nauseated and has vomited multiple times and is also having diarrhea.  Advised pt should report to ER for further eval, she is agreeable.

## 2016-12-20 NOTE — Telephone Encounter (Signed)
Called patient per Dr. Claris Gladden request and advised him to increase Torsemide to 40 mg Daily but to go ahead and take an extra 20 mg now since he took his regular dose of 20 mg this morning.  I also confirmed his appt with Dr. Aundra Dubin in the morning at 10:30.  Patient is aware and agreeable with plan.

## 2016-12-21 ENCOUNTER — Ambulatory Visit (HOSPITAL_COMMUNITY)
Admission: RE | Admit: 2016-12-21 | Discharge: 2016-12-21 | Disposition: A | Discharge status: Home / Self Care | Payer: BLUE CROSS/BLUE SHIELD | Source: Ambulatory Visit | Attending: Cardiology | Admitting: Cardiology

## 2016-12-21 ENCOUNTER — Encounter (HOSPITAL_COMMUNITY): Payer: Self-pay

## 2016-12-21 VITALS — BP 130/73 | HR 91 | Wt 183.0 lb

## 2016-12-21 DIAGNOSIS — N183 Chronic kidney disease, stage 3 (moderate): Secondary | ICD-10-CM | POA: Diagnosis not present

## 2016-12-21 DIAGNOSIS — Z7901 Long term (current) use of anticoagulants: Secondary | ICD-10-CM | POA: Insufficient documentation

## 2016-12-21 DIAGNOSIS — E1122 Type 2 diabetes mellitus with diabetic chronic kidney disease: Secondary | ICD-10-CM | POA: Insufficient documentation

## 2016-12-21 DIAGNOSIS — Z7982 Long term (current) use of aspirin: Secondary | ICD-10-CM | POA: Insufficient documentation

## 2016-12-21 DIAGNOSIS — I2583 Coronary atherosclerosis due to lipid rich plaque: Secondary | ICD-10-CM

## 2016-12-21 DIAGNOSIS — I252 Old myocardial infarction: Secondary | ICD-10-CM | POA: Diagnosis not present

## 2016-12-21 DIAGNOSIS — Z794 Long term (current) use of insulin: Secondary | ICD-10-CM | POA: Insufficient documentation

## 2016-12-21 DIAGNOSIS — I130 Hypertensive heart and chronic kidney disease with heart failure and stage 1 through stage 4 chronic kidney disease, or unspecified chronic kidney disease: Secondary | ICD-10-CM | POA: Diagnosis not present

## 2016-12-21 DIAGNOSIS — I2510 Atherosclerotic heart disease of native coronary artery without angina pectoris: Secondary | ICD-10-CM | POA: Diagnosis not present

## 2016-12-21 DIAGNOSIS — I255 Ischemic cardiomyopathy: Secondary | ICD-10-CM | POA: Insufficient documentation

## 2016-12-21 DIAGNOSIS — I82499 Acute embolism and thrombosis of other specified deep vein of unspecified lower extremity: Secondary | ICD-10-CM

## 2016-12-21 DIAGNOSIS — I5022 Chronic systolic (congestive) heart failure: Secondary | ICD-10-CM

## 2016-12-21 DIAGNOSIS — Z7902 Long term (current) use of antithrombotics/antiplatelets: Secondary | ICD-10-CM | POA: Diagnosis not present

## 2016-12-21 DIAGNOSIS — Z79899 Other long term (current) drug therapy: Secondary | ICD-10-CM | POA: Insufficient documentation

## 2016-12-21 LAB — BASIC METABOLIC PANEL
ANION GAP: 10 (ref 5–15)
BUN: 37 mg/dL — AB (ref 6–20)
CHLORIDE: 106 mmol/L (ref 101–111)
CO2: 23 mmol/L (ref 22–32)
Calcium: 9.1 mg/dL (ref 8.9–10.3)
Creatinine, Ser: 1.88 mg/dL — ABNORMAL HIGH (ref 0.61–1.24)
GFR calc Af Amer: 46 mL/min — ABNORMAL LOW (ref >60)
GFR, EST NON AFRICAN AMERICAN: 39 mL/min — AB (ref >60)
GLUCOSE: 106 mg/dL — AB (ref 65–99)
Potassium: 4.8 mmol/L (ref 3.5–5.1)
Sodium: 139 mmol/L (ref 135–145)

## 2016-12-21 LAB — DIGOXIN LEVEL: Digoxin Level: <0.2 ng/mL — ABNORMAL LOW (ref 0.8–2.0)

## 2016-12-21 LAB — BRAIN NATRIURETIC PEPTIDE: B NATRIURETIC PEPTIDE 5: 648.9 pg/mL — AB (ref 0.0–100.0)

## 2016-12-21 MED ORDER — CARVEDILOL 3.125 MG PO TABS: 3.1250 mg | ORAL_TABLET | Freq: Two times a day (BID) | ORAL | 6 refills | Status: DC

## 2016-12-21 MED ORDER — PANTOPRAZOLE SODIUM 40 MG PO TBEC: 40.0000 mg | DELAYED_RELEASE_TABLET | Freq: Every day | ORAL | 3 refills | Status: DC

## 2016-12-21 NOTE — Progress Notes (Signed)
PCP: Dr. Ola Spurr HF Cardiology: Dr. Aundra Dubin  51 yo with history of CAD s/p anterior MI in 2010 and chronic systolic CHF (ischemic cardiomyopathy) was admitted in 3/18 with 10 days of increased dyspnea and nausea.  Patient had been stable until that time, taking Lasix 80 mg po bid. Noted that weight was rising and he was developing lower extremity edema that was spreading up to his abdomen.  He tried increasing Lasix to 80 mg tid but this did not help.  He had orthopnea.  No chest pain.  Symptoms and presentation consistent with CHF, but troponin found to be elevated at 5.11.  UOP initially poor with rise in creatinine to 2.6. He was found to have low output with co-ox 36% and was started on milrinone gtt with diuresis.  Volume status and creatinine gradually improved.  He eventually underwent cath with severe proximal RCA and mid LAD stenoses noted, these were treated with DES.   He was readmitted shortly after discharge with chest pain.  This was not thought to be ACS but he was found to be in AKI again with hyperkalemia.  Digoxin and spironolactone were stopped.  Torsemide was stopped for a couple of days.   He was seen here again last week with chest pain again.  There were no significant ECG changes. Ranolazine was started.  This seems to have helped, chest pain now rare, mainly notes when he is bending over.  Not having exertional chest pain.  No dyspnea walking on flat ground but gets tired after walking 10-15 minutes.  He has not recovered back to baseline yet symptomatically after last admission. No orthopnea/PND.  No BRBPR or melena.  He can climb a flight of stairs. Weight is up 2 lbs.  Yesterday, we called and increased his torsemide to 40 mg daily.   Labs (4/18): K 5.7 => 4.7, creatinine 1.67 => 1.95, hgb 12.8, BNP 383  Corevue reviewed: Impedance has fallen suggesting volume overload.    PMH: 1. Chronic systolic CHF: Ischemic cardiomyopathy.  St Jude ICD.  - Echo (11/17) with EF 25-30%,  moderate diastolic dysfunction, mild MR, mildly dilated RV with moderately decreased systolic function, peak RV-RA gradient 48 mmHg.  - Echo (3/18) with EF 25-30%, wall motion abnormalities, normal RV size and systolic function.  - Admission 3/18 with cardiogenic shock and AKI, required milrinone gtt.  2. CAD: Anterior MI 2005, had bifurcation stenting LAD/diagonal.  Repeat cath 2010 with patent stents.  - NSTEMI 3/18: LHC with 99% ulcerated lesion proximal RCA, 95% mid LAD => PCI with DES to proximal RCA and mid LAD.  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  4. Crohns disease: History of colectomy.  5. GERD 6. Gastroparesis 7. HTN 8. Hyperlipidemia 9. Type II diabetes   Social History   Social History  . Marital status: Married    Spouse name: N/A  . Number of children: N/A  . Years of education: N/A   Occupational History  . Not on file.   Social History Main Topics  . Smoking status: Never Smoker  . Smokeless tobacco: Never Used  . Alcohol use No  . Drug use: No  . Sexual activity: No   Other Topics Concern  . Not on file   Social History Narrative  . No narrative on file   Family History  Problem Relation Age of Onset  . Heart attack Father   . Stroke Paternal Grandfather    ROS: All systems reviewed and negative except as per HPI.  Current Outpatient Prescriptions  Medication Sig Dispense Refill  . aspirin 81 MG tablet Take 1 tablet (81 mg total) by mouth daily. 30 tablet 0  . atorvastatin (LIPITOR) 80 MG tablet Take 1 tablet (80 mg total) by mouth daily at 6 PM. 30 tablet 6  . clopidogrel (PLAVIX) 75 MG tablet Take 1 tablet (75 mg total) by mouth daily. 30 tablet 6  . digoxin (LANOXIN) 0.125 MG tablet Take 1 tablet (0.125 mg total) by mouth daily. 90 tablet 3  . hydrALAZINE (APRESOLINE) 50 MG tablet Take 1.5 tablets (75 mg total) by mouth 3 (three) times daily. 135 tablet 6  . isosorbide mononitrate (IMDUR) 60 MG 24 hr tablet Take 1.5 tablets (90 mg total) by mouth  daily. 45 tablet 6  . ranolazine (RANEXA) 500 MG 12 hr tablet Take 1 tablet (500 mg total) by mouth 2 (two) times daily. 60 tablet 3  . torsemide (DEMADEX) 20 MG tablet Take 2 tablets (40 mg total) by mouth daily. 60 tablet 3  . warfarin (COUMADIN) 7.5 MG tablet Take 7.3m today (12/06/16)  and tomorrow (12/07/16) --> further direction after INR check on Thursday 12/08/16 30 tablet 0  . carvedilol (COREG) 3.125 MG tablet Take 1 tablet (3.125 mg total) by mouth 2 (two) times daily. 60 tablet 6  . insulin glargine (LANTUS) 100 UNIT/ML injection Inject 0.3 mLs (30 Units total) into the skin every morning. Sliding scale. (Patient taking differently: Inject 35 Units into the skin every morning. Sliding scale. ) 10 mL 11  . nitroGLYCERIN (NITROSTAT) 0.4 MG SL tablet Place 1 tablet (0.4 mg total) under the tongue every 5 (five) minutes as needed for chest pain. (Patient not taking: Reported on 12/13/2016) 25 tablet 3  . pantoprazole (PROTONIX) 40 MG tablet Take 1 tablet (40 mg total) by mouth daily. 30 tablet 3   No current facility-administered medications for this encounter.    BP 130/73   Pulse 91   Wt 183 lb (83 kg)   SpO2 100%   BMI 27.83 kg/m  General: NAD Neck: JVP 8 cm, no thyromegaly or thyroid nodule.  Lungs: CTAB. CV: Nondisplaced PMI.  Heart regular S1/S2, no S3/S4, no murmur.  No peripheral edema.  No carotid bruit.  Normal pedal pulses.  Abdomen: Soft, nontender, no hepatosplenomegaly, no distention.  Skin: Intact without lesions or rashes.  Neurologic: Alert and oriented x 3.  Psych: Normal affect. Extremities: No clubbing or cyanosis.  HEENT: Normal.   Assessment/Plan: 1. Chronic systolic CHF: History of ischemic cardiomyopathy with 11/17 echo showing EF 25-30%.  He was admitted in 3/18 with cardiogenic shock and AKI in the setting of NSTEMI.  Echo showed stably low EF, 25-30%.  He had PCI to LAD and RCA.  He required milrinone and diuresis, milrinone was weaned off. He looks only  mildly overloaded by exam but Corevue also suggests increased fluid.  Weight is up 2 lbs.  NYHA class II-III symptoms, fatigued easily. - Torsemide was increased to 40 mg daily yesterday, leave at 40 mg daily for now.  BMET today.  - Continue digoxin, check level.   - Continue hydralazine 75 mg tid and Imdur 90 daily.  - No ARB/ACEI with recent AKI and suspect creatinine still above baseline, no spironolactone with recent hyperkalemia.   - Add Coreg 3.125 mg bid.  2. CKD: Stage 3.  Check BMET today.  3. CAD: NSTEMI in 3/18. LHC with 99% ulcerated lesion proximal RCA with left to right collaterals, 95% mid LAD stenosis  after mid LAD stent.  s/p PCI to RCA and LAD on 11/25/16.  Chest pain mostly resolved with use of ranolazine.  - On Plavix and ASA.  Will stop ASA after 1 month given use of warfarin (12/26/16).  - Continue high dose statin.  - Refer to cardiac rehab.  - Given use of clopidogrel and risk for interaction, I will have him stop omeprazole and start Protonix 40 mg daily.  4. h/o DVTs: Continue warfarin.   Followup in 10 days.   [name] 12/21/2016

## 2016-12-21 NOTE — Patient Instructions (Signed)
Routine lab work today. Will notify you of abnormal results, otherwise no news is good news!  Take Torsemide 40 mg (2 tabs) once daily every morning.  Follow up 2 weeks.  Do the following things EVERYDAY: 1) Weigh yourself in the morning before breakfast. Write it down and keep it in a log. 2) Take your medicines as prescribed 3) Eat low salt foods-Limit salt (sodium) to 2000 mg per day.  4) Stay as active as you can everyday 5) Limit all fluids for the day to less than 2 liters

## 2016-12-22 NOTE — Progress Notes (Signed)
Call to patient.  He reported he is feeling a lot better and no further GI upset.  Advised will recheck fluid levels on 01/09/2017 but to call if he has any problems before that.

## 2016-12-22 NOTE — Progress Notes (Signed)
Instructions called to patient by Elige Ko, RN in HF clinic and was seen by Dr Aundra Dubin in office 12/21/2016. Torsemide 20 mg increased to 40 mg (2 tabs) once daily every morning at office visit.  Next ICM remote changed to 01/09/2017 since he has HF office visit on 01/03/2017.

## 2016-12-24 ENCOUNTER — Other Ambulatory Visit (HOSPITAL_COMMUNITY): Payer: Self-pay | Admitting: Student

## 2016-12-27 ENCOUNTER — Ambulatory Visit (INDEPENDENT_AMBULATORY_CARE_PROVIDER_SITE_OTHER): Payer: BLUE CROSS/BLUE SHIELD | Admitting: *Deleted

## 2016-12-27 ENCOUNTER — Ambulatory Visit (INDEPENDENT_AMBULATORY_CARE_PROVIDER_SITE_OTHER): Payer: BLUE CROSS/BLUE SHIELD | Admitting: Internal Medicine

## 2016-12-27 ENCOUNTER — Encounter: Payer: Self-pay | Admitting: Internal Medicine

## 2016-12-27 VITALS — BP 130/86 | HR 92 | Ht 68.0 in | Wt 189.0 lb

## 2016-12-27 DIAGNOSIS — Z95810 Presence of automatic (implantable) cardiac defibrillator: Secondary | ICD-10-CM

## 2016-12-27 DIAGNOSIS — I255 Ischemic cardiomyopathy: Secondary | ICD-10-CM | POA: Diagnosis not present

## 2016-12-27 DIAGNOSIS — I5022 Chronic systolic (congestive) heart failure: Secondary | ICD-10-CM | POA: Diagnosis not present

## 2016-12-27 DIAGNOSIS — I82499 Acute embolism and thrombosis of other specified deep vein of unspecified lower extremity: Secondary | ICD-10-CM

## 2016-12-27 DIAGNOSIS — Z5181 Encounter for therapeutic drug level monitoring: Secondary | ICD-10-CM

## 2016-12-27 LAB — POCT INR: INR: 1.4

## 2016-12-27 MED ORDER — CARVEDILOL 6.25 MG PO TABS: 6.2500 mg | ORAL_TABLET | Freq: Two times a day (BID) | ORAL | 6 refills | Status: DC

## 2016-12-27 NOTE — Addendum Note (Signed)
Addended by: Alvis Lemmings C on: 12/27/2016 05:37 PM   Modules accepted: Orders

## 2016-12-27 NOTE — Progress Notes (Signed)
Patient Care Team: [name], MD as PCP - General (Family Medicine)   HPI  [name] is a 51 y.o. male Seen In followup for an ICD implanted 2010 for primary prevention. This was done in the setting of prior MI, prior stenting and ischemic cardiomyopathy with an ejection fraction of 15-20% and patent stents .   He underwent generator replacement 1/18   He then was admitted 3/18 with acute kidney failure cardiac failure requiring inotropic support. He underwent a 25 pound diuresis. Elevated troponins prompted catheterization and he had an RCA and LAD stent placed  Echo EF was 20-25%  He is feeling somewhat better. He does note however that his weight is up 5 pounds over the last week.    Labs were drawn this morning but digoxin level was not included.  Past Medical History:  Diagnosis Date  . Angina   . ASCVD (arteriosclerotic cardiovascular disease)    , Anterior infarction 2005, LAD diagonal bifurcation intervention 03/2004  . Automatic implantable cardiac defibrillator -[employer]. Jude[employer]       . Benign neoplasm of colon   . Chronic systolic heart failure ([place])   . Coronary artery disease     Widely patent previously placed stents in the left anterior   . Crohn's disease ([place])   . 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 Gastroparesis   . GERD (gastroesophageal reflux disease)   . High cholesterol   . Hyperlipidemia   . Hypersomnolent    Previous diagnosis of narcolepsy  . Hypertension, essential   . Ischemic cardiomyopathy    Ejection fraction 15-20% catheterization 2010  . Type II or unspecified type diabetes mellitus without mention of complication, not stated as uncontrolled   . Unspecified gastritis and gastroduodenitis without mention of hemorrhage     Past Surgical History:  Procedure Laterality Date  . APPENDECTOMY    . CARDIAC DEFIBRILLATOR PLACEMENT  2010   St. Jude ICD  . COLECTOMY     "for Crohn's"  . COLON  SURGERY    . CORONARY STENT INTERVENTION N/A 11/25/2016   Procedure: Coronary Stent Intervention;  Surgeon: Leonie Man, MD;  Location: [facility];  Service: Cardiovascular;  Laterality: N/A;  . EP IMPLANTABLE DEVICE N/A 09/14/2016   Procedure: ICD Generator Changeout;  Surgeon: Deboraha Sprang, MD;  Location: [facility];  Service: Cardiovascular;  Laterality: N/A;  . FETAL SURGERY FOR CONGENITAL HERNIA    . INGUINAL HERNIA REPAIR    . RIGHT/LEFT HEART CATH AND CORONARY ANGIOGRAPHY N/A 11/23/2016   Procedure: Right/Left Heart Cath and Coronary Angiography;  Surgeon: Larey Dresser, MD;  Location: [facility];  Service: Cardiovascular;  Laterality: N/A;    Current Outpatient Prescriptions  Medication Sig Dispense Refill  . aspirin 81 MG tablet Take 1 tablet (81 mg total) by mouth daily. 30 tablet 0  . atorvastatin (LIPITOR) 80 MG tablet Take 1 tablet (80 mg total) by mouth daily at 6 PM. 30 tablet 6  . carvedilol (COREG) 3.125 MG tablet Take 1 tablet (3.125 mg total) by mouth 2 (two) times daily. 60 tablet 6  . clopidogrel (PLAVIX) 75 MG tablet Take 1 tablet (75 mg total) by mouth daily. 30 tablet 6  . digoxin (LANOXIN) 0.125 MG tablet Take 1 tablet (0.125 mg total) by mouth daily. 90 tablet 3  . hydrALAZINE (APRESOLINE) 50 MG tablet Take 1.5 tablets (75 mg total) by mouth 3 (  three) times daily. 135 tablet 6  . insulin glargine (LANTUS) 100 UNIT/ML injection Inject 0.3 mLs (30 Units total) into the skin every morning. Sliding scale. (Patient taking differently: Inject 35 Units into the skin every morning. Sliding scale. ) 10 mL 11  . isosorbide mononitrate (IMDUR) 60 MG 24 hr tablet Take 1.5 tablets (90 mg total) by mouth daily. 45 tablet 6  . nitroGLYCERIN (NITROSTAT) 0.4 MG SL tablet Place 1 tablet (0.4 mg total) under the tongue every 5 (five) minutes as needed for chest pain. 25 tablet 3  . pantoprazole (PROTONIX) 40 MG tablet Take 1 tablet (40 mg total) by mouth  daily. 30 tablet 3  . ranolazine (RANEXA) 500 MG 12 hr tablet Take 1 tablet (500 mg total) by mouth 2 (two) times daily. 60 tablet 3  . torsemide (DEMADEX) 20 MG tablet Take 2 tablets (40 mg total) by mouth daily. 60 tablet 3  . warfarin (COUMADIN) 7.5 MG tablet Take 7.4m today (12/06/16)  and tomorrow (12/07/16) --> further direction after INR check on Thursday 12/08/16 30 tablet 0   No current facility-administered medications for this visit.     Allergies  Allergen Reactions  . Metformin And Related Diarrhea    Review of Systems negative except from HPI and PMH  Physical Exam BP 130/86   Pulse 92   Ht 5' 8"  (1.727 m)   Wt 189 lb (85.7 kg)   SpO2 96%   BMI 28.74 kg/m  Well developed and well nourished in no acute distress HENT normal E scleral and icterus clear Neck Supple JVP flat; carotids brisk and full Clear to ausculation Device pocket well healed; without hematoma or erythema.  There is no tethering  regular rate and rhythm, no murmurs gallops or rub Soft with active bowel sounds No clubbing cyanosis  Edema Alert and oriented, grossly normal motor and sensory function Skin Warm and Dry  ECG demonstrated sinus rhythm at 89 Intervals 20/09/38 Low-voltage limb leads  Assessment and  Plan  Hypertension  Ischemic cardiomyopathy     Congestive heart failure-chronic/subacute systolic  Euvolemic continue current meds  Implantable defibrillator - [employer] Judes      Renal insufficiency grade 3   Patient's blood pressure is sufficiently high and heart rate sufficiently high that after discussions with Dr. SMarlou Porchwe will increase his carvedilol from 3.125--6.25.  His weight is concerning. We will increase his torsemide from 40 daily--80/40 until his weight returns to baseline.  Renal issues preclude ACE inhibitor if his creatinine comes down, he may be a candidate for Aldactone. I will defer this to the heart failure service and Dr. SMarlou Porch We will see him again in 9  months.

## 2016-12-27 NOTE — Patient Instructions (Addendum)
Medication Instructions: Your physician has recommended you make the following change in your medication:  1) Increase coreg (carvedilol) 6.25 mg - take one tablet by mouth twice daily 2) Increase torsemide 20 mg- take 2 tablets (40 mg) once every other day alternating with 4 tablets (80 mg) once every other day until your weight is back to baseline.   Labwork: - none ordered  Procedures/Testing: - none ordered  Follow-Up: - Remote monitoring is used to monitor your Pacemaker of ICD from home. This monitoring reduces the number of office visits required to check your device to one time per year. It allows [place] to keep an eye on the functioning of your device to ensure it is working properly. You are scheduled for a device check from home on 03/28/17. You may send your transmission at any time that day. If you have a wireless device, the transmission will be sent automatically. After your physician reviews your transmission, you will receive a postcard with your next transmission date.  - Your physician wants you to follow-up in: 1 year with Dr. Caryl Comes. You will receive a reminder letter in the mail two months in advance. If you don't receive a letter, please call our office to schedule the follow-up appointment.   Any Additional Special Instructions Will Be Listed Below (If Applicable).     If you need a refill on your cardiac medications before your next appointment, please call your pharmacy.

## 2016-12-28 ENCOUNTER — Telehealth (HOSPITAL_COMMUNITY): Payer: Self-pay | Admitting: *Deleted

## 2016-12-28 ENCOUNTER — Ambulatory Visit (HOSPITAL_COMMUNITY)
Admission: RE | Admit: 2016-12-28 | Discharge: 2016-12-28 | Disposition: A | Discharge status: Home / Self Care | Payer: BLUE CROSS/BLUE SHIELD | Source: Ambulatory Visit | Attending: Internal Medicine | Admitting: Internal Medicine

## 2016-12-28 ENCOUNTER — Telehealth: Payer: Self-pay | Admitting: *Deleted

## 2016-12-28 DIAGNOSIS — I5022 Chronic systolic (congestive) heart failure: Secondary | ICD-10-CM

## 2016-12-28 LAB — BASIC METABOLIC PANEL
Anion gap: 9 (ref 5–15)
BUN: 35 mg/dL — AB (ref 6–20)
CO2: 25 mmol/L (ref 22–32)
CREATININE: 1.87 mg/dL — AB (ref 0.61–1.24)
Calcium: 8.9 mg/dL (ref 8.9–10.3)
Chloride: 106 mmol/L (ref 101–111)
GFR calc Af Amer: 46 mL/min — ABNORMAL LOW (ref >60)
GFR, EST NON AFRICAN AMERICAN: 40 mL/min — AB (ref >60)
Glucose, Bld: 91 mg/dL (ref 65–99)
POTASSIUM: 4.1 mmol/L (ref 3.5–5.1)
SODIUM: 140 mmol/L (ref 135–145)

## 2016-12-28 LAB — DIGOXIN LEVEL

## 2016-12-28 MED ORDER — TORSEMIDE 20 MG PO TABS: 60.0000 mg | ORAL_TABLET | Freq: Every day | ORAL | 3 refills | Status: DC

## 2016-12-28 NOTE — Telephone Encounter (Signed)
Spoke with [name] and instructed him that Dr Aundra Dubin did want him to stop Aspirin 43m daily and he states understanding of this order

## 2016-12-28 NOTE — Telephone Encounter (Signed)
Called and spoke with patient regarding torsemide dose.  Dr. Aundra Dubin wants him to take 60 mg (3 Tablets) Daily and to come in for lab work this week.  Patient is agreeable with plan and is able to come in this afternoon for a BMET and Digoxin level.    Lab orders placed, lab appointment scheduled, and medication updated.

## 2016-12-28 NOTE — Telephone Encounter (Signed)
-----   Message from Larey Dresser, MD sent at 12/28/2016  7:35 AM EDT ----- He should have stopped ASA on 4/23.  Can you remind him to do so? THanks.  ----- Message ----- From: Margretta Sidle, RN Sent: 12/28/2016   7:09 AM To: Larey Dresser, MD  Dr Rolla Plate pt in coumadin clinic yesterday and his INR was 1.4 coumadin dose adjusted as indicated Read your note of April 14th and ASA was mentioned to be discontinued after 1 month  ASA was still on med list just wanted to know if it is to be stopped  Please advise Thanks Elbert Ewings RN

## 2016-12-29 ENCOUNTER — Other Ambulatory Visit: Payer: Self-pay | Admitting: Internal Medicine

## 2016-12-29 LAB — CUP PACEART INCLINIC DEVICE CHECK
Brady Statistic RA Percent Paced: 0.02 %
Date Time Interrogation Session: 20180424205602
HighPow Impedance: 38.5664
Implantable Lead Location: 753859
Lead Channel Pacing Threshold Amplitude: 1 V
Lead Channel Pacing Threshold Pulse Width: 0.5 ms
Lead Channel Pacing Threshold Pulse Width: 0.5 ms
Lead Channel Setting Pacing Amplitude: 2 V
Lead Channel Setting Pacing Amplitude: 2.5 V
Lead Channel Setting Pacing Pulse Width: 0.5 ms
Lead Channel Setting Sensing Sensitivity: 0.5 mV
MDC IDC LEAD IMPLANT DT: 20100430
MDC IDC LEAD IMPLANT DT: 20100430
MDC IDC LEAD LOCATION: 753860
MDC IDC MSMT LEADCHNL RA IMPEDANCE VALUE: 362.5 Ohm
MDC IDC MSMT LEADCHNL RA PACING THRESHOLD AMPLITUDE: 1 V
MDC IDC MSMT LEADCHNL RA SENSING INTR AMPL: 2 mV
MDC IDC MSMT LEADCHNL RV IMPEDANCE VALUE: 350 Ohm
MDC IDC MSMT LEADCHNL RV SENSING INTR AMPL: 12 mV
MDC IDC PG IMPLANT DT: 20180110
MDC IDC PG SERIAL: 7383809
MDC IDC STAT BRADY RV PERCENT PACED: 0.04 %

## 2017-01-03 ENCOUNTER — Encounter (HOSPITAL_COMMUNITY): Payer: Self-pay

## 2017-01-03 ENCOUNTER — Ambulatory Visit (HOSPITAL_COMMUNITY)
Admission: RE | Admit: 2017-01-03 | Discharge: 2017-01-03 | Disposition: A | Discharge status: Home / Self Care | Payer: BLUE CROSS/BLUE SHIELD | Source: Ambulatory Visit | Attending: Cardiology | Admitting: Cardiology

## 2017-01-03 ENCOUNTER — Ambulatory Visit (INDEPENDENT_AMBULATORY_CARE_PROVIDER_SITE_OTHER): Payer: BLUE CROSS/BLUE SHIELD | Admitting: Pharmacist

## 2017-01-03 ENCOUNTER — Other Ambulatory Visit (HOSPITAL_COMMUNITY): Payer: Self-pay | Admitting: Cardiology

## 2017-01-03 ENCOUNTER — Encounter: Payer: Self-pay | Admitting: Physician Assistant

## 2017-01-03 VITALS — BP 140/78 | HR 90 | Temp 99.0°F | Wt 183.2 lb

## 2017-01-03 DIAGNOSIS — E1122 Type 2 diabetes mellitus with diabetic chronic kidney disease: Secondary | ICD-10-CM | POA: Diagnosis not present

## 2017-01-03 DIAGNOSIS — Z7902 Long term (current) use of antithrombotics/antiplatelets: Secondary | ICD-10-CM | POA: Diagnosis not present

## 2017-01-03 DIAGNOSIS — N183 Chronic kidney disease, stage 3 (moderate): Secondary | ICD-10-CM | POA: Insufficient documentation

## 2017-01-03 DIAGNOSIS — Z7982 Long term (current) use of aspirin: Secondary | ICD-10-CM | POA: Insufficient documentation

## 2017-01-03 DIAGNOSIS — I252 Old myocardial infarction: Secondary | ICD-10-CM | POA: Insufficient documentation

## 2017-01-03 DIAGNOSIS — I82499 Acute embolism and thrombosis of other specified deep vein of unspecified lower extremity: Secondary | ICD-10-CM

## 2017-01-03 DIAGNOSIS — K5090 Crohn's disease, unspecified, without complications: Secondary | ICD-10-CM | POA: Diagnosis not present

## 2017-01-03 DIAGNOSIS — I130 Hypertensive heart and chronic kidney disease with heart failure and stage 1 through stage 4 chronic kidney disease, or unspecified chronic kidney disease: Secondary | ICD-10-CM | POA: Insufficient documentation

## 2017-01-03 DIAGNOSIS — Z7901 Long term (current) use of anticoagulants: Secondary | ICD-10-CM | POA: Diagnosis not present

## 2017-01-03 DIAGNOSIS — Z86718 Personal history of other venous thrombosis and embolism: Secondary | ICD-10-CM | POA: Diagnosis not present

## 2017-01-03 DIAGNOSIS — R112 Nausea with vomiting, unspecified: Secondary | ICD-10-CM

## 2017-01-03 DIAGNOSIS — I2583 Coronary atherosclerosis due to lipid rich plaque: Secondary | ICD-10-CM

## 2017-01-03 DIAGNOSIS — E1143 Type 2 diabetes mellitus with diabetic autonomic (poly)neuropathy: Secondary | ICD-10-CM | POA: Diagnosis not present

## 2017-01-03 DIAGNOSIS — Z794 Long term (current) use of insulin: Secondary | ICD-10-CM | POA: Insufficient documentation

## 2017-01-03 DIAGNOSIS — I2510 Atherosclerotic heart disease of native coronary artery without angina pectoris: Secondary | ICD-10-CM | POA: Diagnosis not present

## 2017-01-03 DIAGNOSIS — K219 Gastro-esophageal reflux disease without esophagitis: Secondary | ICD-10-CM | POA: Insufficient documentation

## 2017-01-03 DIAGNOSIS — I5022 Chronic systolic (congestive) heart failure: Secondary | ICD-10-CM

## 2017-01-03 DIAGNOSIS — Z5181 Encounter for therapeutic drug level monitoring: Secondary | ICD-10-CM | POA: Diagnosis not present

## 2017-01-03 DIAGNOSIS — E785 Hyperlipidemia, unspecified: Secondary | ICD-10-CM | POA: Insufficient documentation

## 2017-01-03 DIAGNOSIS — K3184 Gastroparesis: Secondary | ICD-10-CM | POA: Diagnosis not present

## 2017-01-03 DIAGNOSIS — I255 Ischemic cardiomyopathy: Secondary | ICD-10-CM | POA: Diagnosis not present

## 2017-01-03 LAB — BASIC METABOLIC PANEL
Anion gap: 9 (ref 5–15)
BUN: 39 mg/dL — ABNORMAL HIGH (ref 6–20)
CALCIUM: 9.1 mg/dL (ref 8.9–10.3)
CO2: 24 mmol/L (ref 22–32)
CREATININE: 1.88 mg/dL — AB (ref 0.61–1.24)
Chloride: 106 mmol/L (ref 101–111)
GFR calc non Af Amer: 39 mL/min — ABNORMAL LOW (ref >60)
GFR, EST AFRICAN AMERICAN: 46 mL/min — AB (ref >60)
Glucose, Bld: 75 mg/dL (ref 65–99)
Potassium: 4.6 mmol/L (ref 3.5–5.1)
SODIUM: 139 mmol/L (ref 135–145)

## 2017-01-03 LAB — CBC
HCT: 39.2 % (ref 39.0–52.0)
Hemoglobin: 12.8 g/dL — ABNORMAL LOW (ref 13.0–17.0)
MCH: 25 pg — ABNORMAL LOW (ref 26.0–34.0)
MCHC: 32.7 g/dL (ref 30.0–36.0)
MCV: 76.7 fL — ABNORMAL LOW (ref 78.0–100.0)
PLATELETS: 285 10*3/uL (ref 150–400)
RBC: 5.11 MIL/uL (ref 4.22–5.81)
RDW: 17.4 % — AB (ref 11.5–15.5)
WBC: 9.4 10*3/uL (ref 4.0–10.5)

## 2017-01-03 LAB — POCT INR: INR: 5.8

## 2017-01-03 NOTE — Progress Notes (Signed)
Advanced Heart Failure Medication Review by a Pharmacist  Does the patient  feel that his/her medications are working for him/her?  yes  Has the patient been experiencing any side effects to the medications prescribed?  no  Does the patient measure his/her own blood pressure or blood glucose at home?  yes   Does the patient have any problems obtaining medications due to transportation or finances?   no  Understanding of regimen: good Understanding of indications: good Potential of compliance: good Patient understands to avoid NSAIDs. Patient understands to avoid decongestants.  Issues to address at subsequent visits: None   Pharmacist comments: [name] is a pleasant 51 yo M presenting with his son and without a medication list but with good recall of his regimen. He reports good compliance with his regimen but has not taken any medications since yesterday morning 2/2 n/v that he has been having. No other medication-related questions or concerns for me at this time.   Ruta Hinds. Velva Harman, PharmD, BCPS, CPP Clinical Pharmacist Pager: [phone] Phone: [phone] 01/03/2017 10:19 AM      Time with patient: 10 minutes Preparation and documentation time: 2 minutes Total time: 12 minutes

## 2017-01-03 NOTE — Progress Notes (Signed)
PCP: Dr. Ola Spurr HF Cardiology: Dr. Aundra Dubin  51 yo with history of CAD s/p anterior MI in 2010 and chronic systolic CHF (ischemic cardiomyopathy) was admitted in 3/18 with 10 days of increased dyspnea and nausea.  Patient had been stable until that time, taking Lasix 80 mg po bid. Noted that weight was rising and he was developing lower extremity edema that was spreading up to his abdomen.  He tried increasing Lasix to 80 mg tid but this did not help.  He had orthopnea.  No chest pain.  Symptoms and presentation consistent with CHF, but troponin found to be elevated at 5.11.  UOP initially poor with rise in creatinine to 2.6. He was found to have low output with co-ox 36% and was started on milrinone gtt with diuresis.  Volume status and creatinine gradually improved.  He eventually underwent cath with severe proximal RCA and mid LAD stenoses noted, these were treated with DES.   He was readmitted shortly after discharge with chest pain.  This was not thought to be ACS but he was found to be in AKI again with hyperkalemia.  Digoxin and spironolactone were stopped.  Torsemide was stopped for a couple of days.   Pt presents today for 2 week follow up. At last visit added coreg 3.125 mg BID. Increase torsemide to 60 mg daily last week. He has chronic N/V/D that waxes and wanes.  Has been unable to tolerate po since then. Hasn't had his meds.  Having diarrhea since last night. Having some chest discomfort this morning, not exertion. Denies SOB. At baseline has NYHA II-III symptoms ( can walk about 10-15) + bendopnea with accompanying chest discomfort (chronic). Weight down 5 lbs since yesterday with not eating and N/V/D. Denies fever but + chills.   Labs (4/18): K 5.7 => 4.7, creatinine 1.67 => 1.95, hgb 12.8, BNP 383  Corevue: Merlin device out for repairs in clinic.   PMH: 1. Chronic systolic CHF: Ischemic cardiomyopathy.  St Jude ICD.  - Echo (11/17) with EF 25-30%, moderate diastolic dysfunction,  mild MR, mildly dilated RV with moderately decreased systolic function, peak RV-RA gradient 48 mmHg.  - Echo (3/18) with EF 25-30%, wall motion abnormalities, normal RV size and systolic function.  - Admission 3/18 with cardiogenic shock and AKI, required milrinone gtt.  2. CAD: Anterior MI 2005, had bifurcation stenting LAD/diagonal.  Repeat cath 2010 with patent stents.  - NSTEMI 3/18: LHC with 99% ulcerated lesion proximal RCA, 95% mid LAD => PCI with DES to proximal RCA and mid LAD.  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  4. Crohns disease: History of colectomy.  5. GERD 6. Gastroparesis 7. HTN 8. Hyperlipidemia 9. Type II diabetes   Social History   Social History  . Marital status: Married    Spouse name: N/A  . Number of children: N/A  . Years of education: N/A   Occupational History  . Not on file.   Social History Main Topics  . Smoking status: Never Smoker  . Smokeless tobacco: Never Used  . Alcohol use No  . Drug use: No  . Sexual activity: No   Other Topics Concern  . Not on file   Social History Narrative  . No narrative on file   Family History  Problem Relation Age of Onset  . Heart attack Father   . Stroke Paternal Grandfather    Review of systems complete and found to be negative unless listed in HPI.    Current Outpatient Prescriptions  Medication Sig Dispense Refill  . atorvastatin (LIPITOR) 80 MG tablet Take 1 tablet (80 mg total) by mouth daily at 6 PM. 30 tablet 6  . carvedilol (COREG) 6.25 MG tablet Take 1 tablet (6.25 mg total) by mouth 2 (two) times daily. 60 tablet 6  . clopidogrel (PLAVIX) 75 MG tablet Take 1 tablet (75 mg total) by mouth daily. 30 tablet 6  . digoxin (LANOXIN) 0.125 MG tablet Take 1 tablet (0.125 mg total) by mouth daily. 90 tablet 3  . hydrALAZINE (APRESOLINE) 50 MG tablet Take 1.5 tablets (75 mg total) by mouth 3 (three) times daily. 135 tablet 6  . insulin glargine (LANTUS) 100 UNIT/ML injection Inject 35 Units into the skin  every morning.    . isosorbide mononitrate (IMDUR) 60 MG 24 hr tablet Take 1.5 tablets (90 mg total) by mouth daily. 45 tablet 6  . pantoprazole (PROTONIX) 40 MG tablet Take 1 tablet (40 mg total) by mouth daily. 30 tablet 3  . ranolazine (RANEXA) 500 MG 12 hr tablet Take 1 tablet (500 mg total) by mouth 2 (two) times daily. 60 tablet 3  . torsemide (DEMADEX) 20 MG tablet Take 3 tablets (60 mg total) by mouth daily. 90 tablet 3  . warfarin (COUMADIN) 7.5 MG tablet Take 7.47m today (12/06/16)  and tomorrow (12/07/16) --> further direction after INR check on Thursday 12/08/16 30 tablet 0  . nitroGLYCERIN (NITROSTAT) 0.4 MG SL tablet Place 1 tablet (0.4 mg total) under the tongue every 5 (five) minutes as needed for chest pain. (Patient not taking: Reported on 01/03/2017) 25 tablet 3   No current facility-administered medications for this encounter.    BP 140/78   Pulse 90   Temp 99 F (37.2 C)   Wt 183 lb 3.2 oz (83.1 kg)   SpO2 100%   BMI 27.86 kg/m    Wt Readings from Last 3 Encounters:  01/03/17 183 lb 3.2 oz (83.1 kg)  12/27/16 189 lb (85.7 kg)  12/21/16 183 lb (83 kg)    General: Fatigued appearing man in NAD.  HEENT: Normal  Neck: supple. JVD 7-8. Carotids 2+ bilat; no bruits. No thyromegaly or nodule noted. Cor: PMI nondisplaced. RRR, No M/G/R noted Lungs: CTAB, normal effort. Abdomen: soft, non-tender, distended, no HSM. No bruits or masses. +BS  Extremities: no cyanosis, clubbing, rash, No peripheral edema.  Neuro: alert & orientedx3, cranial nerves grossly intact. moves all 4 extremities w/o difficulty. Affect flat but appropriate.   Assessment/Plan: 1. Chronic systolic CHF: History of ischemic cardiomyopathy with 11/17 echo showing EF 25-30%.  He was admitted in 3/18 with cardiogenic shock and AKI in the setting of NSTEMI.  Echo showed stably low EF, 25-30%.  He had PCI to LAD and RCA.  He required milrinone and diuresis, milrinone was weaned off.  - He looks to have mild volume  overload at most on exam. ReDS vest 37%.  - NYHA class III symptoms. Remains easily fatigued.  - Continue higher dose torsemide 60 mg daily. Resume once his N/V/D improves.  - Continue digoxin. Level stable on 12/28/16    - Continue hydralazine 75 mg tid and Imdur 90 daily.  - No ARB/ACEI with recent AKI and suspect creatinine still above baseline, no spironolactone with recent hyperkalemia.   - Add Coreg 3.125 mg bid.  2. CKD: Stage 3.   - BMET today.  3. CAD: NSTEMI in 3/18. LHC with 99% ulcerated lesion proximal RCA with left to right collaterals, 95% mid LAD stenosis after  mid LAD stent.  s/p PCI to RCA and LAD on 11/25/16.  Chest pain mostly resolved with use of ranolazine.  - On Plavix and ASA.  Will stop ASA after 1 month given use of warfarin (12/26/16).  - Continue high dose statin.  - Needs to start cardiac rehab once improved.  - Given use of clopidogrel and risk for interaction,  - Continue protonix 40 mg daily.  4. h/o DVTs: Continue warfarin. Denies bleeding.  5. Chronic N/V/D with gastroparesis with exacerbation. - Formally followed by Dr. Velora Heckler in GI. Will refer back to [facility]. Hasn't seen anyone in several years.   Mildly volume overloaded on exam and ReDS vest 37. With Acute N/V/D will not increase diuretics today. BMET/BNP/CBC today. Keep close 2 week follow up for now.   Shirley Friar, PA-C 01/03/2017  Greater than 50% of the 25 minute visit was spent in counseling/coordination of care regarding disease state education, sliding scale diuretics, review of medical regimen with on-site pharm-D, and fluid/salt restriction.

## 2017-01-03 NOTE — Patient Instructions (Signed)
Labs today (will call for abnormal results, otherwise no news is good news)  We have referred you to see GI.  Follow up with Oda Kilts, PA in 2 weeks and Dr. Aundra Dubin in 6 weeks.

## 2017-01-03 NOTE — Progress Notes (Signed)
    ReDS Vest - 01/03/17 1000      ReDS Vest   MR  No   Estimated volume prior to reading Med   Fitting Posture Standing   Height Marker Tall   Ruler Value 10.5   Center Strip Aligned   ReDS Value 37       Kirsta Probert K. Velva Harman, PharmD, BCPS, CPP Clinical Pharmacist Pager: [phone] Phone: [phone] 01/03/2017 10:44 AM

## 2017-01-04 LAB — BRAIN NATRIURETIC PEPTIDE: B NATRIURETIC PEPTIDE 5: 520.2 pg/mL — AB (ref 0.0–100.0)

## 2017-01-05 ENCOUNTER — Encounter (HOSPITAL_COMMUNITY): Payer: Self-pay

## 2017-01-05 NOTE — Progress Notes (Signed)
Patient's FMLA continued for part time (5 hrs per day, 5 days per week) per patient request through end of June. Faxed to provided # [phone] Copy of forms scanned into patient's electronic medical record.  Renee Pain, RN

## 2017-01-09 ENCOUNTER — Ambulatory Visit (INDEPENDENT_AMBULATORY_CARE_PROVIDER_SITE_OTHER): Payer: BLUE CROSS/BLUE SHIELD

## 2017-01-09 ENCOUNTER — Telehealth: Payer: Self-pay | Admitting: Cardiology

## 2017-01-09 DIAGNOSIS — I5022 Chronic systolic (congestive) heart failure: Secondary | ICD-10-CM

## 2017-01-09 DIAGNOSIS — Z95810 Presence of automatic (implantable) cardiac defibrillator: Secondary | ICD-10-CM

## 2017-01-09 NOTE — Telephone Encounter (Signed)
LMOVM reminding pt to send remote transmission.   

## 2017-01-10 ENCOUNTER — Encounter: Payer: Self-pay | Admitting: Physician Assistant

## 2017-01-10 ENCOUNTER — Other Ambulatory Visit: Payer: Self-pay | Admitting: *Deleted

## 2017-01-10 ENCOUNTER — Ambulatory Visit (INDEPENDENT_AMBULATORY_CARE_PROVIDER_SITE_OTHER): Payer: BLUE CROSS/BLUE SHIELD | Admitting: Physician Assistant

## 2017-01-10 ENCOUNTER — Other Ambulatory Visit: Payer: Self-pay | Admitting: Physician Assistant

## 2017-01-10 ENCOUNTER — Ambulatory Visit (INDEPENDENT_AMBULATORY_CARE_PROVIDER_SITE_OTHER): Payer: BLUE CROSS/BLUE SHIELD | Admitting: *Deleted

## 2017-01-10 VITALS — BP 110/60 | HR 84 | Ht 68.0 in | Wt 190.6 lb

## 2017-01-10 DIAGNOSIS — I82499 Acute embolism and thrombosis of other specified deep vein of unspecified lower extremity: Secondary | ICD-10-CM | POA: Diagnosis not present

## 2017-01-10 DIAGNOSIS — R112 Nausea with vomiting, unspecified: Secondary | ICD-10-CM | POA: Diagnosis not present

## 2017-01-10 DIAGNOSIS — Z5181 Encounter for therapeutic drug level monitoring: Secondary | ICD-10-CM | POA: Diagnosis not present

## 2017-01-10 DIAGNOSIS — K3184 Gastroparesis: Secondary | ICD-10-CM | POA: Diagnosis not present

## 2017-01-10 LAB — POCT INR: INR: 4.8

## 2017-01-10 MED ORDER — WARFARIN SODIUM 5 MG PO TABS: ORAL_TABLET | ORAL | 1 refills | Status: DC

## 2017-01-10 MED ORDER — ONDANSETRON 4 MG PO TBDP: 4.0000 mg | ORAL_TABLET | Freq: Four times a day (QID) | ORAL | 1 refills | Status: DC | PRN

## 2017-01-10 MED ORDER — METOCLOPRAMIDE HCL 5 MG PO TABS: ORAL_TABLET | ORAL | 2 refills | Status: DC

## 2017-01-10 NOTE — Patient Instructions (Addendum)
If you are age 52 or older, your body mass index should be between 23-30. Your Body mass index is 28.98 kg/m. If this is out of the aforementioned range listed, please consider follow up with your Primary Care Provider.  If you are age 86 or younger, your body mass index should be between 19-25. Your Body mass index is 28.98 kg/m. If this is out of the aformentioned range listed, please consider follow up with your Primary Care Provider.   We have sent the following medications to your pharmacy for you to pick up at your convenience:  Reglan Zofran  Okay to Stop Protonix.  You have been given literature for Gastroparesis Diet Step 3.  Follow up with Dr. Silverio Decamp on March 01, 2017 at 8:45 a.m.  Thank you for choosing me and [facility].  Amy Esterwood, PA-C

## 2017-01-10 NOTE — Progress Notes (Signed)
EPIC Encounter for ICM Monitoring  Patient Name: [name] is a 51 y.o. male Date: 01/10/2017 Primary Care Physican: [name], MD Primary Cardiologist: [facility] Electrophysiologist: Caryl Comes Dry Weight: 187 lbs       Heart Failure questions reviewed, pt symptomatic with feeling tired and gained 3-4 pounds in last 2 weeks.   Thoracic impedance abnormal suggesting fluid accumulation 12/11/2016 to 12/24/2016, 12/31/2016 to 01/02/2017 and 01/08/2017 but trending back toward baseline.  Prescribed and confirmed dosage: Torsemide 20 mg 3 tablets (60 mg total) daily   Labs: 01/03/2017 Creatinine 1.88, BUN 39, Potassium 4.6, Sodium 139, EGFR 39-46 12/28/2016 Creatinine 1.87, BUN 35, Potassium 4.1, Sodium 140, EGFR 40-46 12/21/2016 Creatinine 1.88, BUN 37, Potassium 4.8. Sodium 139, EGFR 39-46 12/13/2016 Creatinine 1.95, BUN 75, Potassium 4.7, Sodium 137, EGFR 38-44 12/08/2016 Creatinine 1.67, BUN 70, Potassium 5.7, Sodium 138, EGFR 46-54  12/06/2016 Creatinine 2.08, BUN 84, Potassium 5.1, Sodium 135, EGFR 35-40  12/05/2016 Creatinine 2.10, BUN 85, Potassium 6.1, Sodium 131, EGFR 35-40  11/28/2016 Creatinine 1.38, BUN 34, Potassium 4.8, Sodium 137, EGFR 57->60  11/27/2016 Creatinine 1.51, BUN 30, Potassium 5.1, Sodium 135, EGFR 51-60  11/26/2016 Creatinine 1.57, BUN 27, Potassium 5.2, Sodium 138, EGFR 49-57  11/25/2016 Creatinine 1.69, BUN 34, Potassium 4.8, Sodium 134, EGFR 45-52  11/24/2016 Creatinine 1.92, BUN 37, Potassium 4.9, Sodium 134, EGFR 38-45  11/23/2016 Creatinine 1.92, BUN 41, Potassium 4.5, Sodium 130, EGFR 38-45  11/22/2016 Creatinine 2.00, BUN 44, Potassium 4.8, Sodium 130, EGFR 37-42 11/21/2016 Creatinine 2.69, BUN 53, Potassium 4.3, Sodium 133, EGFR 26-30  11/20/2016 Creatinine 2.63, BUN 51, Potassium 4.9, Sodium 133, EGFR 26-30  11/19/2016 Creatinine 1.65, BUN 38, Potassium 4.8, Sodium 131, EGFR 46-54  11/18/2016 Creatinine 1.35, BUN 29,  Potassium 4.2, Sodium 138, EGFR >60  11/09/2016 Creatinine 1.63, BUN 37, Potassium 4.7, Sodium 137  09/07/2016 Creatinine 1.47, BUN 34, Potassium 4.1, Sodium 140, EGFR 54-63   Recommendations:  Copy of ICM check sent to Dr Aundra Dubin and Dr Caryl Comes for review.   Follow-up plan: ICM clinic phone appointment on 01/26/2017.  Office appointment scheduled on 5/15/208 with HF clinic.    3 month ICM trend: 01/10/2017   1 Year ICM trend:      Rosalene Billings, RN 01/10/2017 1:47 PM

## 2017-01-10 NOTE — Progress Notes (Signed)
Subjective:    Patient ID: [name], male    DOB: [birth year], 51 y.o.   MRN: [id number]  HPI   [name] is a 51 year old African-American male, referred today by Dr. Dingus/cardiology for GI follow-up. Patient had been a previous patient of Dr. Verl Blalock, and has not been seen by GI since 2009. Patient has previously documented history of diabetic gastroparesis, and also had remote Crohn's disease for which she underwent a right hemicolectomy greater than 15 years ago. He has not had any evidence of active Crohn's since that time. Patient has multiple other comorbidities including significant cardiac disease with ischemic cardiomyopathy and EF of about 25%. He is status post ICD placement. He is also insulin-dependent. He had recent admission in March 2018 with congestive heart failure and cardiogenic shock. He had catheterization done during that admission with finding of severe RCA and LAD stenosis and had drug-eluting stents placed. He was felt to have had a non-ST MI. He is now on Plavix and Coumadin. Gastric emptying scan had been done during hospitalization in 2013 and this showed 72% retention at 2 hours. He also had abnormal gastric emptying scan in 2003   Last colonoscopy 2009 with left-sided diverticulosis, prior right hemicolectomy with widely patent anastomosis was a 5 mm rectal polyp removed which bypass showed mucosal prolapse and no adenomatous changes. EGD done at that same time showed gastritis biopsies for H. pylori were negative and small bowel biopsies were negative. Patient says he has had intermittent problems with nausea and vomiting for years. He recalls taking  Reglan at one point in the past but doesn't remember how effective this was. He has a current prescription for Zofran tablets but says  not. helpful because sometimes he vomits them back up. He states he may go for several days without any vomiting and then have a spell where he vomits 4-5 times in 1 day.  This has  gone back-and-forth long-term. He denies any heartburn or indigestion, no dysphagia. He is on Protonix but says he has never had any acid reflux as far as he is aware. He does endorse feeling very full very frequently. He has no complaints of abdominal pain. His weight is up 10-12 pounds over the past few weeks and diuretics had recently been increased. He also has had occasional spells of diarrhea for years. He says he has not noticed any change in this pattern and no melena or hematochezia. There is no family history of colon cancer.    Review of Systems Pertinent positive and negative review of systems were noted in the above HPI section.  All other review of systems was otherwise negative.  Outpatient Encounter Prescriptions as of 01/10/2017  Medication Sig  . atorvastatin (LIPITOR) 80 MG tablet Take 1 tablet (80 mg total) by mouth daily at 6 PM.  . carvedilol (COREG) 6.25 MG tablet Take 1 tablet (6.25 mg total) by mouth 2 (two) times daily.  . clopidogrel (PLAVIX) 75 MG tablet Take 1 tablet (75 mg total) by mouth daily.  . digoxin (LANOXIN) 0.125 MG tablet Take 1 tablet (0.125 mg total) by mouth daily.  . hydrALAZINE (APRESOLINE) 50 MG tablet Take 1.5 tablets (75 mg total) by mouth 3 (three) times daily.  . insulin glargine (LANTUS) 100 UNIT/ML injection Inject 35 Units into the skin every morning.  . isosorbide mononitrate (IMDUR) 60 MG 24 hr tablet Take 1.5 tablets (90 mg total) by mouth daily.  . nitroGLYCERIN (NITROSTAT) 0.4 MG SL tablet Place 1  tablet (0.4 mg total) under the tongue every 5 (five) minutes as needed for chest pain.  . pantoprazole (PROTONIX) 40 MG tablet Take 1 tablet (40 mg total) by mouth daily.  . ranolazine (RANEXA) 500 MG 12 hr tablet Take 1 tablet (500 mg total) by mouth 2 (two) times daily.  [name] torsemide (DEMADEX) 20 MG tablet Take 3 tablets (60 mg total) by mouth daily.  [name] warfarin (COUMADIN) 5 MG tablet Take as directed by coumadin clinic  .  metoCLOPramide (REGLAN) 5 MG tablet Take one tablet 1/2 hr before meals.  . ondansetron (ZOFRAN ODT) 4 MG disintegrating tablet Take 1 tablet (4 mg total) by mouth every 6 (six) hours as needed for nausea or vomiting.   No facility-administered encounter medications on file as of 01/10/2017.    Allergies  Allergen Reactions  . Metformin And Related Diarrhea   Patient Active Problem List   Diagnosis Date Noted  . AKI (acute kidney injury) ([place])   . Non-ST elevation (NSTEMI) myocardial infarction ([place])   . CHF (congestive heart failure) ([place]) 09/02/2016  . Type 2 diabetes mellitus without complication, with long-term current use of insulin ([place])   . Elevated serum creatinine   . Encounter for therapeutic drug monitoring 08/03/2016  . Chest pain 04/26/2012  . SVT (supraventricular tachycardia) ([place]) 04/26/2012  . Gastroparesis due to DM ([place]) 11/18/2011  . Nausea and vomiting in adult 11/17/2011  . Coronary artery disease   . Ischemic cardiomyopathy   . Essential hypertension   . Automatic implantable cardioverter-defibrillator in situ   . Deep venous thrombosis ([place])   . Hypersomnolent   . POLYP, COLON 09/25/2008  . DM 09/25/2008  . Dyslipidemia, goal LDL below 70 09/25/2008  . GASTRITIS 09/25/2008  . DIVERTICULOSIS, COLON 09/25/2008  . RECTAL MASS 09/25/2008   Social History   Social History  . Marital status: Married    Spouse name: N/A  . Number of children: N/A  . Years of education: N/A   Occupational History  . Not on file.   Social History Main Topics  . Smoking status: Never Smoker  . Smokeless tobacco: Never Used  . Alcohol use No  . Drug use: No  . Sexual activity: No   Other Topics Concern  . Not on file   Social History Narrative  . No narrative on file    Mr. Mclane's family history includes Colitis in his sister and sister; Colon polyps in his mother; Diabetes in his father and mother; Heart attack in his father; Heart disease in his brother;  Stroke in his paternal grandfather.      Objective:    Vitals:   01/10/17 1344  BP: 110/60  Pulse: 84    Physical Exam well-developed African-American male in no acute distress, pleasant blood pressure 110/60 pulse 84, height 5 foot 8, weight 190, BMI 28.9. HEENT; nontraumatic normocephalic EOMI PERRLA sclera anicteric, Cardiovascular ;regular rate and rhythm with S1-S2 ICD in the left chest wall, Pulmonary ;clear bilaterally, Abdomen; soft, nontender nondistended bowel sounds are active there is no palpable mass or hepatosplenomegaly does have midline incisional scar no definite succussion splash, Rectal ;exam not done, Extremities; no clubbing cyanosis or edema skin warm and dry, Neuropsych ;mood and affect appropriate       Assessment & Plan:   #39  51 year old African-American male, diabetic with previously documented diagnosis of gastroparesis who is referred for somewhat worsening episodes of nausea and vomiting over the past 3 months. These are occurring frequently but not  daily. He also does have some symptoms of early satiety. Symptoms are consistent with gastroparesis. #2 status post remote right hemicolectomy-remote diagnosis of Crohn's-last colonoscopy 2009 negative #3 diverticulosis #4 congestive heart failure #5 ischemic cardiomyopathy with EF 25% #6 status post ICD #7 coronary artery disease with recent admission with CHF and cardiogenic shock and had cath showing RCA and LAD disease with drug-eluting stents placed March 2018   #8 intermittent diarrhea long-standing, no change in pattern #9 insulin-dependent diabetes mellitus  Plan; Will start step 3 gastroparesis diet, patient was given a copy and also reviewed Start Zofran 4 mg ODT every 6 hours when necessary for nausea and vomiting. Start trial of metoclopramide low-dose, 5 mg one half hour before meals. We discussed potential neurologic side effects, and patient aware that he should stop the medication and also  notify us should he have any adverse effects. Patient will follow-up with Dr. Silverio Decamp for next office visit. If he does well on metoclopramide we may want to switch to domperidone. Consider follow-up colonoscopy at 10 year interval in 2019 depending on his overall health status.   Naeem Quillin S Mckell Riecke PA-C 01/10/2017   Cc: Larey Seat*

## 2017-01-10 NOTE — Telephone Encounter (Signed)
Spoke with pt and he states he had few Coumadin 6m tablets and Coumadin 7.5101mtablets so pt instructed will order him only coumadin 52m51mablets and refill coumadin 52mg63mis nurse also called the pharmacy and cancelled the refill sent today  for coumadin 7.52mg30m

## 2017-01-10 NOTE — Progress Notes (Signed)
Take torsemide 80 mg daily x 2 days then back to 60 mg daily.

## 2017-01-11 NOTE — Progress Notes (Signed)
Reviewed and agree with documentation and assessment and plan. K. Veena Yoshiko Keleher , MD   

## 2017-01-12 NOTE — Progress Notes (Signed)
Attempted call to patient to provide recommendations and left message to return call.

## 2017-01-12 NOTE — Progress Notes (Signed)
Spoke with patient.  Advised Dr Aundra Dubin recommended he take torsemide 80 mg daily x 2 days then back to 60 mg daily.  He verbalized understanding.  He has HF clinic appt 01/17/2017 and will recheck ICM transmission 01/26/2017

## 2017-01-16 ENCOUNTER — Telehealth (HOSPITAL_COMMUNITY): Payer: Self-pay

## 2017-01-16 NOTE — Telephone Encounter (Signed)
*  Updated insurance information* [employer] - $30.00 co-payment, no deductible, $2000/$2000 has been met, 30% co-insurance, no pre-authorization and no limit on visit. Passport/reference [phone].

## 2017-01-17 ENCOUNTER — Other Ambulatory Visit: Payer: Self-pay | Admitting: Cardiology

## 2017-01-17 ENCOUNTER — Ambulatory Visit (HOSPITAL_COMMUNITY)
Admission: RE | Admit: 2017-01-17 | Discharge: 2017-01-17 | Disposition: A | Discharge status: Home / Self Care | Payer: BLUE CROSS/BLUE SHIELD | Source: Ambulatory Visit | Attending: Cardiology | Admitting: Cardiology

## 2017-01-17 ENCOUNTER — Encounter (HOSPITAL_COMMUNITY)
Admission: RE | Admit: 2017-01-17 | Discharge: 2017-01-17 | Disposition: A | Discharge status: Home / Self Care | Payer: BLUE CROSS/BLUE SHIELD | Source: Ambulatory Visit | Attending: Cardiology | Admitting: Cardiology

## 2017-01-17 ENCOUNTER — Ambulatory Visit (INDEPENDENT_AMBULATORY_CARE_PROVIDER_SITE_OTHER): Payer: BLUE CROSS/BLUE SHIELD | Admitting: *Deleted

## 2017-01-17 ENCOUNTER — Encounter (HOSPITAL_COMMUNITY): Payer: Self-pay

## 2017-01-17 VITALS — BP 114/70 | HR 68 | Wt 190.8 lb

## 2017-01-17 VITALS — BP 114/60 | HR 74 | Ht 68.0 in | Wt 189.2 lb

## 2017-01-17 DIAGNOSIS — I82499 Acute embolism and thrombosis of other specified deep vein of unspecified lower extremity: Secondary | ICD-10-CM

## 2017-01-17 DIAGNOSIS — I2510 Atherosclerotic heart disease of native coronary artery without angina pectoris: Secondary | ICD-10-CM | POA: Insufficient documentation

## 2017-01-17 DIAGNOSIS — N186 End stage renal disease: Secondary | ICD-10-CM | POA: Insufficient documentation

## 2017-01-17 DIAGNOSIS — K219 Gastro-esophageal reflux disease without esophagitis: Secondary | ICD-10-CM | POA: Diagnosis not present

## 2017-01-17 DIAGNOSIS — Z8371 Family history of colonic polyps: Secondary | ICD-10-CM | POA: Diagnosis not present

## 2017-01-17 DIAGNOSIS — Z86718 Personal history of other venous thrombosis and embolism: Secondary | ICD-10-CM | POA: Diagnosis not present

## 2017-01-17 DIAGNOSIS — M7989 Other specified soft tissue disorders: Secondary | ICD-10-CM | POA: Insufficient documentation

## 2017-01-17 DIAGNOSIS — I255 Ischemic cardiomyopathy: Secondary | ICD-10-CM | POA: Insufficient documentation

## 2017-01-17 DIAGNOSIS — I5023 Acute on chronic systolic (congestive) heart failure: Secondary | ICD-10-CM | POA: Insufficient documentation

## 2017-01-17 DIAGNOSIS — Z992 Dependence on renal dialysis: Secondary | ICD-10-CM

## 2017-01-17 DIAGNOSIS — Z5181 Encounter for therapeutic drug level monitoring: Secondary | ICD-10-CM

## 2017-01-17 DIAGNOSIS — K3184 Gastroparesis: Secondary | ICD-10-CM

## 2017-01-17 DIAGNOSIS — I130 Hypertensive heart and chronic kidney disease with heart failure and stage 1 through stage 4 chronic kidney disease, or unspecified chronic kidney disease: Secondary | ICD-10-CM | POA: Diagnosis not present

## 2017-01-17 DIAGNOSIS — Z7902 Long term (current) use of antithrombotics/antiplatelets: Secondary | ICD-10-CM | POA: Insufficient documentation

## 2017-01-17 DIAGNOSIS — K5090 Crohn's disease, unspecified, without complications: Secondary | ICD-10-CM | POA: Insufficient documentation

## 2017-01-17 DIAGNOSIS — I25110 Atherosclerotic heart disease of native coronary artery with unstable angina pectoris: Secondary | ICD-10-CM

## 2017-01-17 DIAGNOSIS — N183 Chronic kidney disease, stage 3 unspecified: Secondary | ICD-10-CM

## 2017-01-17 DIAGNOSIS — E1122 Type 2 diabetes mellitus with diabetic chronic kidney disease: Secondary | ICD-10-CM | POA: Diagnosis not present

## 2017-01-17 DIAGNOSIS — I5022 Chronic systolic (congestive) heart failure: Secondary | ICD-10-CM | POA: Diagnosis not present

## 2017-01-17 DIAGNOSIS — Z955 Presence of coronary angioplasty implant and graft: Secondary | ICD-10-CM | POA: Insufficient documentation

## 2017-01-17 DIAGNOSIS — I252 Old myocardial infarction: Secondary | ICD-10-CM | POA: Diagnosis not present

## 2017-01-17 DIAGNOSIS — Z794 Long term (current) use of insulin: Secondary | ICD-10-CM | POA: Insufficient documentation

## 2017-01-17 DIAGNOSIS — E785 Hyperlipidemia, unspecified: Secondary | ICD-10-CM | POA: Diagnosis not present

## 2017-01-17 DIAGNOSIS — Z7901 Long term (current) use of anticoagulants: Secondary | ICD-10-CM | POA: Insufficient documentation

## 2017-01-17 DIAGNOSIS — I82409 Acute embolism and thrombosis of unspecified deep veins of unspecified lower extremity: Secondary | ICD-10-CM

## 2017-01-17 DIAGNOSIS — E1143 Type 2 diabetes mellitus with diabetic autonomic (poly)neuropathy: Secondary | ICD-10-CM | POA: Diagnosis not present

## 2017-01-17 LAB — BASIC METABOLIC PANEL
ANION GAP: 9 (ref 5–15)
BUN: 42 mg/dL — ABNORMAL HIGH (ref 6–20)
CO2: 26 mmol/L (ref 22–32)
Calcium: 8.5 mg/dL — ABNORMAL LOW (ref 8.9–10.3)
Chloride: 101 mmol/L (ref 101–111)
Creatinine, Ser: 2.05 mg/dL — ABNORMAL HIGH (ref 0.61–1.24)
GFR calc Af Amer: 41 mL/min — ABNORMAL LOW (ref >60)
GFR, EST NON AFRICAN AMERICAN: 36 mL/min — AB (ref >60)
GLUCOSE: 201 mg/dL — AB (ref 65–99)
POTASSIUM: 4.8 mmol/L (ref 3.5–5.1)
Sodium: 136 mmol/L (ref 135–145)

## 2017-01-17 LAB — PROTIME-INR
INR: 6.9 — AB (ref 0.8–1.2)
Prothrombin Time: 68.4 s — ABNORMAL HIGH (ref 9.1–12.0)

## 2017-01-17 LAB — BRAIN NATRIURETIC PEPTIDE: B NATRIURETIC PEPTIDE 5: 353.6 pg/mL — AB (ref 0.0–100.0)

## 2017-01-17 LAB — POCT INR

## 2017-01-17 NOTE — Progress Notes (Signed)
Cardiac Individual Treatment Plan  Patient Details  Name: [name] MRN: [id number] Date of Birth: 1966-01-20 Referring Provider:     [place] from 01/17/2017 in [place]  Referring Provider  Candee Furbish, MD.      Initial Encounter Date:    CARDIAC REHAB PHASE II ORIENTATION from 01/17/2017 in [facility]  Date  01/17/17  Referring Provider  Candee Furbish, MD.      Visit Diagnosis: 11/25/16 Stented coronary artery  Heart failure, chronic systolic (HCC)  Patient's Home Medications on Admission:  Current Outpatient Prescriptions:  .  atorvastatin (LIPITOR) 80 MG tablet, Take 1 tablet (80 mg total) by mouth daily at 6 PM., Disp: 30 tablet, Rfl: 6 .  carvedilol (COREG) 6.25 MG tablet, Take 1 tablet (6.25 mg total) by mouth 2 (two) times daily., Disp: 60 tablet, Rfl: 6 .  clopidogrel (PLAVIX) 75 MG tablet, Take 1 tablet (75 mg total) by mouth daily., Disp: 30 tablet, Rfl: 6 .  digoxin (LANOXIN) 0.125 MG tablet, Take 1 tablet (0.125 mg total) by mouth daily., Disp: 90 tablet, Rfl: 3 .  hydrALAZINE (APRESOLINE) 50 MG tablet, Take 1.5 tablets (75 mg total) by mouth 3 (three) times daily., Disp: 135 tablet, Rfl: 6 .  insulin glargine (LANTUS) 100 UNIT/ML injection, Inject 35 Units into the skin at bedtime. , Disp: , Rfl:  .  isosorbide mononitrate (IMDUR) 60 MG 24 hr tablet, Take 1.5 tablets (90 mg total) by mouth daily., Disp: 45 tablet, Rfl: 6 .  metoCLOPramide (REGLAN) 5 MG tablet, Take one tablet 1/2 hr before meals., Disp: 90 tablet, Rfl: 2 .  nitroGLYCERIN (NITROSTAT) 0.4 MG SL tablet, Place 1 tablet (0.4 mg total) under the tongue every 5 (five) minutes as needed for chest pain., Disp: 25 tablet, Rfl: 3 .  ondansetron (ZOFRAN ODT) 4 MG disintegrating tablet, Take 1 tablet (4 mg total) by mouth every 6 (six) hours as needed for nausea or vomiting., Disp: 40 tablet, Rfl: 1 .  ranolazine  (RANEXA) 500 MG 12 hr tablet, Take 1 tablet (500 mg total) by mouth 2 (two) times daily., Disp: 60 tablet, Rfl: 3 .  torsemide (DEMADEX) 20 MG tablet, Take 3 tablets (60 mg total) by mouth daily., Disp: 90 tablet, Rfl: 3 .  warfarin (COUMADIN) 5 MG tablet, Take as directed by coumadin clinic, Disp: 50 tablet, Rfl: 1  Past Medical History: Past Medical History:  Diagnosis Date  . Angina   . ASCVD (arteriosclerotic cardiovascular disease)    , Anterior infarction 2005, LAD diagonal bifurcation intervention 03/2004  . Automatic implantable cardiac defibrillator -[employer]       . Benign neoplasm of colon   . Chronic systolic heart failure ([place])   . Coronary artery disease     Widely patent previously placed stents in the left anterior   . Crohn's disease ([place])   . 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 Gastroparesis   . GERD (gastroesophageal reflux disease)   . High cholesterol   . Hyperlipidemia   . Hypersomnolent    Previous diagnosis of narcolepsy  . Hypertension, essential   . Ischemic cardiomyopathy    Ejection fraction 15-20% catheterization 2010  . Type II or unspecified type diabetes mellitus without mention of complication, not stated as uncontrolled   . Unspecified gastritis and gastroduodenitis without mention of hemorrhage     Tobacco Use: History  Smoking Status  . Never  Smoker  Smokeless Tobacco  . Never Used    Labs: Recent Review Flowsheet Data    Labs for ITP Cardiac and Pulmonary Rehab Latest Ref Rng & Units 11/24/2016 11/25/2016 11/26/2016 11/27/2016 11/28/2016   Cholestrol 0 - 200 mg/dL - - - - -   LDLCALC 0 - 99 mg/dL - - - - -   HDL >40 mg/dL - - - - -   Trlycerides <150 mg/dL - - - - -   Hemoglobin A1c 4.8 - 5.6 % - - - - -   HCO3 20.0 - 28.0 mmol/L - - - - -   TCO2 0 - 100 mmol/L - - - - -   O2SAT % 73.3 72.6 75.9 59.5 71.0      Capillary Blood Glucose: Lab Results  Component Value Date   GLUCAP 172 (H) 12/06/2016   GLUCAP  95 12/06/2016   GLUCAP 179 (H) 12/05/2016   GLUCAP 72 12/05/2016   GLUCAP 112 (H) 11/28/2016     Exercise Target Goals: Date: 01/17/17  Exercise Program Goal: Individual exercise prescription set with THRR, safety & activity barriers. Participant demonstrates ability to understand and report RPE using BORG scale, to self-measure pulse accurately, and to acknowledge the importance of the exercise prescription.  Exercise Prescription Goal: Starting with aerobic activity 30 plus minutes a day, 3 days per week for initial exercise prescription. Provide home exercise prescription and guidelines that participant acknowledges understanding prior to discharge.  Activity Barriers & Risk Stratification:     Activity Barriers & Cardiac Risk Stratification - 01/17/17 0903      Activity Barriers & Cardiac Risk Stratification   Activity Barriers Deconditioning;Muscular Weakness;Shortness of Breath   Cardiac Risk Stratification High      6 Minute Walk:     6 Minute Walk    Row Name 01/17/17 1011         6 Minute Walk   Phase Initial     Distance 965 feet     Walk Time 6 minutes     # of Rest Breaks 0     MPH 1.83     METS 3.03     RPE 10     VO2 Peak 10.6     Symptoms No     Resting HR 74 bpm     Resting BP 114/60     Max Ex. HR 84 bpm     Max Ex. BP 122/80     2 Minute Post BP 120/70        Oxygen Initial Assessment:   Oxygen Re-Evaluation:   Oxygen Discharge (Final Oxygen Re-Evaluation):   Initial Exercise Prescription:     Initial Exercise Prescription - 01/17/17 1000      Date of Initial Exercise RX and Referring Provider   Date 01/17/17   Referring Provider Candee Furbish, MD.     Treadmill   MPH 1.8   Grade 0   Minutes 10   METs 2.38     Bike   Level 0.5   Minutes 10   METs 2.11     NuStep   Level 2   SPM 80   Minutes 10   METs 2     Prescription Details   Frequency (times per week) 3   Duration Progress to 30 minutes of continuous  aerobic without signs/symptoms of physical distress     Intensity   THRR 40-80% of Max Heartrate 67-134   Ratings of Perceived Exertion 11-13   Perceived Dyspnea 0-4  Progression   Progression Continue to progress workloads to maintain intensity without signs/symptoms of physical distress.     Resistance Training   Training Prescription Yes   Weight 2lbs   Reps 10-15      Perform Capillary Blood Glucose checks as needed.  Exercise Prescription Changes:   Exercise Comments:   Exercise Goals and Review:     Exercise Goals    Row Name 01/17/17 0905             Exercise Goals   Increase Physical Activity Yes       Intervention Provide advice, education, support and counseling about physical activity/exercise needs.;Develop an individualized exercise prescription for aerobic and resistive training based on initial evaluation findings, risk stratification, comorbidities and participant's personal goals.       Expected Outcomes Achievement of increased cardiorespiratory fitness and enhanced flexibility, muscular endurance and strength shown through measurements of functional capacity and personal statement of participant.       Increase Strength and Stamina Yes  Improve EF and be able to perform activities without fatigue and exhaustion       Intervention Provide advice, education, support and counseling about physical activity/exercise needs.;Develop an individualized exercise prescription for aerobic and resistive training based on initial evaluation findings, risk stratification, comorbidities and participant's personal goals.       Expected Outcomes Achievement of increased cardiorespiratory fitness and enhanced flexibility, muscular endurance and strength shown through measurements of functional capacity and personal statement of participant.          Exercise Goals Re-Evaluation :    Discharge Exercise Prescription (Final Exercise Prescription  Changes):   Nutrition:  Target Goals: Understanding of nutrition guidelines, daily intake of sodium <158m, cholesterol <2071m calories 30% from fat and 7% or less from saturated fats, daily to have 5 or more servings of fruits and vegetables.  Biometrics:     Pre Biometrics - 01/17/17 1017      Pre Biometrics   Height 5' 8"  (1.727 m)   Weight 189 lb 2.5 oz (85.8 kg)   Waist Circumference 39.75 inches   Hip Circumference 43 inches   Waist to Hip Ratio 0.92 %   BMI (Calculated) 28.8   Triceps Skinfold 26 mm   % Body Fat 29.6 %   Grip Strength 31 kg   Flexibility 7.5 in   Single Leg Stand 2.56 seconds       Nutrition Therapy Plan and Nutrition Goals:   Nutrition Discharge: Nutrition Scores:   Nutrition Goals Re-Evaluation:   Nutrition Goals Re-Evaluation:   Nutrition Goals Discharge (Final Nutrition Goals Re-Evaluation):   Psychosocial: Target Goals: Acknowledge presence or absence of significant depression and/or stress, maximize coping skills, provide positive support system. Participant is able to verbalize types and ability to use techniques and skills needed for reducing stress and depression.  Initial Review & Psychosocial Screening:     Initial Psych Review & Screening - 01/17/17 0844      Initial Review   Current issues with None Identified     Family Dynamics   Good Support System? Yes  family, friends    Comments upon brief assessment, no psychosocial needs identified, no interventions necessary      Barriers   Psychosocial barriers to participate in program There are no identifiable barriers or psychosocial needs.     Screening Interventions   Interventions Encouraged to exercise;Provide feedback about the scores to participant      Quality of Life Scores:  Quality of Life - 01/17/17 0957      Quality of Life Scores   Health/Function Pre 13.3 %   Socioeconomic Pre 18.13 %   Psych/Spiritual Pre 17.14 %   Family Pre 19.9 %    GLOBAL Pre 16.11 %      PHQ-9: Recent Review Flowsheet Data    Depression screen [facility] 2/9 01/17/2017 11/10/2016 11/09/2016   Decreased Interest 0 0 0   Down, Depressed, Hopeless 0 0 0   PHQ - 2 Score 0 0 0     Interpretation of Total Score  Total Score Depression Severity:  1-4 = Minimal depression, 5-9 = Mild depression, 10-14 = Moderate depression, 15-19 = Moderately severe depression, 20-27 = Severe depression   Psychosocial Evaluation and Intervention:   Psychosocial Re-Evaluation:   Psychosocial Discharge (Final Psychosocial Re-Evaluation):   Vocational Rehabilitation: Provide vocational rehab assistance to qualifying candidates.   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 Participant will state understanding/return demonstration of topics presented.  Learning Barriers/Preferences:     Learning Barriers/Preferences - 01/17/17 0843      Learning Barriers/Preferences   Learning Barriers Sight   Learning Preferences Written Material      Education Topics: Count Your Pulse:  -Group instruction provided by verbal instruction, demonstration, patient participation and written materials to support subject.  Instructors address importance of being able to find your pulse and how to count your pulse when at home without a heart monitor.  Patients get hands on experience counting their pulse with staff help and individually.   Heart Attack, Angina, and Risk Factor Modification:  -Group instruction provided by verbal instruction, video, and written materials to support subject.  Instructors address signs and symptoms of angina and  heart attacks.    Also discuss risk factors for heart disease and how to make changes to improve heart health risk factors.   Functional Fitness:  -Group instruction provided by verbal instruction, demonstration, patient participation, and written materials to support subject.  Instructors address safety measures for doing things around the house.  Discuss how to get up and down off the floor, how to pick things up properly, how to safely get out of a chair without assistance, and balance training.   Meditation and Mindfulness:  -Group instruction provided by verbal instruction, patient participation, and written materials to support subject.  Instructor addresses importance of mindfulness and meditation practice to help reduce stress and improve awareness.  Instructor also leads participants through a meditation exercise.    Stretching for Flexibility and Mobility:  -Group instruction provided by verbal instruction, patient participation, and written materials to support subject.  Instructors lead participants through series of stretches that are designed to increase flexibility thus improving mobility.  These stretches are additional exercise for major muscle groups that are typically performed during regular warm up and cool down.   Hands Only CPR Anytime:  -Group instruction provided by verbal instruction, video, patient participation and written materials to support subject.  Instructors co-teach with AHA video for hands only CPR.  Participants get hands on experience with mannequins.   Nutrition I class: Heart Healthy Eating:  -Group instruction  provided by PowerPoint slides, verbal discussion, and written materials to support subject matter. The instructor gives an explanation and review of the Therapeutic Lifestyle Changes diet recommendations, which includes a discussion on lipid goals, dietary fat, sodium, fiber, plant stanol/sterol esters, sugar, and the components of a well-balanced,  healthy diet.   Nutrition II class: Lifestyle Skills:  -Group instruction provided by PowerPoint slides, verbal discussion, and written materials to support subject matter. The instructor gives an explanation and review of label reading, grocery shopping for heart health, heart healthy recipe modifications, and ways to make healthier choices when eating out.   Diabetes Question & Answer:  -Group instruction provided by PowerPoint slides, verbal discussion, and written materials to support subject matter. The instructor gives an explanation and review of diabetes co-morbidities, pre- and post-prandial blood glucose goals, pre-exercise blood glucose goals, signs, symptoms, and treatment of hypoglycemia and hyperglycemia, and foot care basics.   Diabetes Blitz:  -Group instruction provided by PowerPoint slides, verbal discussion, and written materials to support subject matter. The instructor gives an explanation and review of the physiology behind type 1 and type 2 diabetes, diabetes medications and rational behind using different medications, pre- and post-prandial blood glucose recommendations and Hemoglobin A1c goals, diabetes diet, and exercise including blood glucose guidelines for exercising safely.    Portion Distortion:  -Group instruction provided by PowerPoint slides, verbal discussion, written materials, and food models to support subject matter. The instructor gives an explanation of serving size versus portion size, changes in portions sizes over the last 20 years, and what consists of a serving from each food group.   Stress Management:  -Group instruction provided by verbal instruction, video, and written materials to support subject matter.  Instructors review role of stress in heart disease and how to cope with stress positively.     Exercising on Your Own:  -Group instruction provided by verbal instruction, power point, and written materials to support subject.  Instructors  discuss benefits of exercise, components of exercise, frequency and intensity of exercise, and end points for exercise.  Also discuss use of nitroglycerin and activating EMS.  Review options of places to exercise outside of rehab.  Review guidelines for sex with heart disease.   Cardiac Drugs I:  -Group instruction provided by verbal instruction and written materials to support subject.  Instructor reviews cardiac drug classes: antiplatelets, anticoagulants, beta blockers, and statins.  Instructor discusses reasons, side effects, and lifestyle considerations for each drug class.   Cardiac Drugs II:  -Group instruction provided by verbal instruction and written materials to support subject.  Instructor reviews cardiac drug classes: angiotensin converting enzyme inhibitors (ACE-I), angiotensin II receptor blockers (ARBs), nitrates, and calcium channel blockers.  Instructor discusses reasons, side effects, and lifestyle considerations for each drug class.   Anatomy and Physiology of the Circulatory System:  -Group instruction provided by verbal instruction, video, and written materials to support subject.  Reviews functional anatomy of heart, how it relates to various diagnoses, and what role the heart plays in the overall system.   Knowledge Questionnaire Score:     Knowledge Questionnaire Score - 01/17/17 0957      Knowledge Questionnaire Score   Pre Score 23/24      Core Components/Risk Factors/Patient Goals at Admission:     Personal Goals and Risk Factors at Admission - 01/17/17 1125      Core Components/Risk Factors/Patient Goals on Admission   Diabetes Yes   Intervention Provide education about signs/symptoms and action to take for  hypo/hyperglycemia.;Provide education about proper nutrition, including hydration, and aerobic/resistive exercise prescription along with prescribed medications to achieve blood glucose in normal ranges: Fasting glucose 65-99 mg/dL   Expected Outcomes  Short Term: Participant verbalizes understanding of the signs/symptoms and immediate care of hyper/hypoglycemia, proper foot care and importance of medication, aerobic/resistive exercise and nutrition plan for blood glucose control.;Long Term: Attainment of HbA1C < 7%.   Heart Failure Yes   Intervention Provide a combined exercise and nutrition program that is supplemented with education, support and counseling about heart failure. Directed toward relieving symptoms such as shortness of breath, decreased exercise tolerance, and extremity edema.   Expected Outcomes Improve functional capacity of life;Short term: Attendance in program 2-3 days a week with increased exercise capacity. Reported lower sodium intake. Reported increased fruit and vegetable intake. Reports medication compliance.;Short term: Daily weights obtained and reported for increase. Utilizing diuretic protocols set by physician.;Long term: Adoption of self-care skills and reduction of barriers for early signs and symptoms recognition and intervention leading to self-care maintenance.   Hypertension Yes   Intervention Provide education on lifestyle modifcations including regular physical activity/exercise, weight management, moderate sodium restriction and increased consumption of fresh fruit, vegetables, and low fat dairy, alcohol moderation, and smoking cessation.;Monitor prescription use compliance.   Expected Outcomes Short Term: Continued assessment and intervention until BP is < 140/34m HG in hypertensive participants. < 130/839mHG in hypertensive participants with diabetes, heart failure or chronic kidney disease.;Long Term: Maintenance of blood pressure at goal levels.   Lipids Yes   Intervention Provide education and support for participant on nutrition & aerobic/resistive exercise along with prescribed medications to achieve LDL <7052mHDL >77m70m Expected Outcomes Short Term: Participant states understanding of desired  cholesterol values and is compliant with medications prescribed. Participant is following exercise prescription and nutrition guidelines.;Long Term: Cholesterol controlled with medications as prescribed, with individualized exercise RX and with personalized nutrition plan. Value goals: LDL < 70mg36mL > 40 mg.      Core Components/Risk Factors/Patient Goals Review:    Core Components/Risk Factors/Patient Goals at Discharge (Final Review):    ITP Comments:     ITP Comments    Row Name 01/17/17 0820           ITP Comments Dr. TraciFransico Himical Director           Comments: Patient attended orientation from [phone]  to review rules and guidelines for program. Completed 6 minute walk test, Intitial ITP, and exercise prescription.  VSS. Telemetry-sinus rhythm.  Asymptomatic. Pt oriented to department and exercise guidelines.

## 2017-01-17 NOTE — Progress Notes (Addendum)
PCP: Dr. Ola Spurr HF Cardiology: Dr. Aundra Dubin  51 yo with history of CAD s/p anterior MI in 2010 and chronic systolic CHF (ischemic cardiomyopathy) was admitted in 3/18 with 10 days of increased dyspnea and nausea.  Patient had been stable until that time, taking Lasix 80 mg po bid. Noted that weight was rising and he was developing lower extremity edema that was spreading up to his abdomen.  He tried increasing Lasix to 80 mg tid but this did not help.  He had orthopnea.  No chest pain.  Symptoms and presentation consistent with CHF, but troponin found to be elevated at 5.11.  UOP initially poor with rise in creatinine to 2.6. He was found to have low output with co-ox 36% and was started on milrinone gtt with diuresis.  Volume status and creatinine gradually improved.  He eventually underwent cath with severe proximal RCA and mid LAD stenoses noted, these were treated with DES.   He was readmitted shortly after discharge with chest pain.  This was not thought to be ACS but he was found to be in AKI again with hyperkalemia.  Digoxin and spironolactone were stopped.  Torsemide was stopped for a couple of days.   Pt presents today for follow up.   Weight up 7 lbs from last visit, but at that time had Gastroenteritis symptoms.  Over the past few weeks weight trended up.  Took extra torsemide for 2 days without relief. He states he is not very active at all.  Denies SOB with changing clothes or bathing. Primary complaint is fatigue. Denies CP. Does have chronic bendopnea associated with chest discomfort. No fevers or chills. Hasn't had an episode of N/V diarrhea since 2 weeks ago. Has seen GI and started on gastroparesis diet and medications. Pt has had relief but is not optimistic that it will last.   Labs (4/18): K 5.7 => 4.7, creatinine 1.67 => 1.95, hgb 12.8, BNP 383  Corevue: thoracic impedence above threshold after increased torsemide last week, No VT/VF, No AT/AF  PMH: 1. Chronic systolic CHF:  Ischemic cardiomyopathy.  St Jude ICD.  - Echo (11/17) with EF 25-30%, moderate diastolic dysfunction, mild MR, mildly dilated RV with moderately decreased systolic function, peak RV-RA gradient 48 mmHg.  - Echo (3/18) with EF 25-30%, wall motion abnormalities, normal RV size and systolic function.  - Admission 3/18 with cardiogenic shock and AKI, required milrinone gtt.  2. CAD: Anterior MI 2005, had bifurcation stenting LAD/diagonal.  Repeat cath 2010 with patent stents.  - NSTEMI 3/18: LHC with 99% ulcerated lesion proximal RCA, 95% mid LAD => PCI with DES to proximal RCA and mid LAD.  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  4. Crohns disease: History of colectomy.  5. GERD 6. Gastroparesis 7. HTN 8. Hyperlipidemia 9. Type II diabetes   Social History   Social History  . Marital status: Married    Spouse name: N/A  . Number of children: N/A  . Years of education: N/A   Occupational History  . Not on file.   Social History Main Topics  . Smoking status: Never Smoker  . Smokeless tobacco: Never Used  . Alcohol use No  . Drug use: No  . Sexual activity: No   Other Topics Concern  . Not on file   Social History Narrative  . No narrative on file   Family History  Problem Relation Age of Onset  . Colon polyps Mother   . Diabetes Mother   . Heart attack Father   .  Diabetes Father   . Stroke Paternal Grandfather   . Colitis Sister   . Heart disease Brother   . Colitis Sister   . Colon cancer Neg Hx    Review of systems complete and found to be negative unless listed in HPI.    Current Outpatient Prescriptions  Medication Sig Dispense Refill  . atorvastatin (LIPITOR) 80 MG tablet Take 1 tablet (80 mg total) by mouth daily at 6 PM. 30 tablet 6  . carvedilol (COREG) 6.25 MG tablet Take 1 tablet (6.25 mg total) by mouth 2 (two) times daily. 60 tablet 6  . clopidogrel (PLAVIX) 75 MG tablet Take 1 tablet (75 mg total) by mouth daily. 30 tablet 6  . digoxin (LANOXIN) 0.125 MG  tablet Take 1 tablet (0.125 mg total) by mouth daily. 90 tablet 3  . hydrALAZINE (APRESOLINE) 50 MG tablet Take 1.5 tablets (75 mg total) by mouth 3 (three) times daily. 135 tablet 6  . insulin glargine (LANTUS) 100 UNIT/ML injection Inject 35 Units into the skin at bedtime.     . isosorbide mononitrate (IMDUR) 60 MG 24 hr tablet Take 1.5 tablets (90 mg total) by mouth daily. 45 tablet 6  . metoCLOPramide (REGLAN) 5 MG tablet Take one tablet 1/2 hr before meals. 90 tablet 2  . nitroGLYCERIN (NITROSTAT) 0.4 MG SL tablet Place 1 tablet (0.4 mg total) under the tongue every 5 (five) minutes as needed for chest pain. 25 tablet 3  . ondansetron (ZOFRAN ODT) 4 MG disintegrating tablet Take 1 tablet (4 mg total) by mouth every 6 (six) hours as needed for nausea or vomiting. 40 tablet 1  . ranolazine (RANEXA) 500 MG 12 hr tablet Take 1 tablet (500 mg total) by mouth 2 (two) times daily. 60 tablet 3  . torsemide (DEMADEX) 20 MG tablet Take 3 tablets (60 mg total) by mouth daily. 90 tablet 3  . warfarin (COUMADIN) 5 MG tablet Take as directed by coumadin clinic 50 tablet 1   No current facility-administered medications for this encounter.    BP 114/70 (BP Location: Left Arm, Patient Position: Sitting, Cuff Size: Normal)   Pulse 68   Wt 190 lb 12.8 oz (86.5 kg)   SpO2 100%   BMI 29.01 kg/m    Wt Readings from Last 3 Encounters:  01/17/17 190 lb 12.8 oz (86.5 kg)  01/17/17 189 lb 2.5 oz (85.8 kg)  01/10/17 190 lb 9.6 oz (86.5 kg)    General: Fatigued appearing. NAD.  HEENT: normal Neck: supple. JVD ~6-7 cm. Carotids 2+ bilat; no bruits. No thyromegaly or nodule noted. Cor: PMI nondisplaced. RRR, No M/G/R noted Lungs: CTAB, normal effort. Abdomen: soft, non-tender, distended, no HSM. No bruits or masses. +BS  Extremities: no cyanosis, clubbing, or rash. Trace ankle edema at most.   Neuro: alert & orientedx3, cranial nerves grossly intact. moves all 4 extremities w/o difficulty. Affect flat but  appropriate.  Assessment/Plan: 1. Chronic systolic CHF: History of ischemic cardiomyopathy with 11/17 echo showing EF 25-30%.  He was admitted in 3/18 with cardiogenic shock and AKI in the setting of NSTEMI.  Echo showed stably low EF, 25-30%.  He had PCI to LAD and RCA.  He required milrinone and diuresis, milrinone was weaned off.  - NYHA class III symptoms but mostly limited by fatigue. - Volume status looks OK on exam and stable on corevue.  - Continue torsemide 60 mg daily.  - Continue digoxin. Level stable on 12/28/16    - Continue hydralazine 75 mg  tid and Imdur 90 daily.  - No ARB/ACEI with recent AKI and suspect creatinine still above baseline, no spironolactone with recent hyperkalemia.   - Continue Coreg 3.125 mg bid. With fatigue will not up-titrate today.  - Reinforced fluid restriction to < 2 L daily, sodium restriction to less than 2000 mg daily, and the importance of daily weights.   2. CKD: Stage 3.   - BMET today .  3. CAD: NSTEMI in 3/18. LHC with 99% ulcerated lesion proximal RCA with left to right collaterals, 95% mid LAD stenosis after mid LAD stent.  s/p PCI to RCA and LAD on 11/25/16.  Chest pain mostly resolved with use of ranolazine.  - On Plavix and ASA.  Will stop ASA after 1 month given use of warfarin (12/26/16).  - Continue high dose statin.  - Seen for initial cardiac rehab visit today.   - Given use of clopidogrel and risk for interaction,  - Continue protonix 40 mg daily.  4. h/o DVTs: Continue warfarin. Denies bleeding. NO change. 5. Chronic N/V/D with gastroparesis with exacerbation. - Seen by GI now. On gastroparesis meds and diet which seems to have helped. Appreciated their input.   Volume looks OK. He remains fatigued, but I think that a good bit of this is related to his deconditioning. Started Cardiac Rehab today. Labs today.   Shirley Friar, PA-C 01/17/2017

## 2017-01-17 NOTE — Patient Instructions (Signed)
No changes to medication at this time.  Routine lab work today. Will notify you of abnormal results, otherwise no news is good news!  Follow up as scheduled.  Do the following things EVERYDAY: 1) Weigh yourself in the morning before breakfast. Write it down and keep it in a log. 2) Take your medicines as prescribed 3) Eat low salt foods-Limit salt (sodium) to 2000 mg per day.  4) Stay as active as you can everyday 5) Limit all fluids for the day to less than 2 liters

## 2017-01-17 NOTE — Addendum Note (Signed)
Encounter addended by: Shirley Friar, PA-C on: 01/17/2017  1:11 PM<BR>    Actions taken: Sign clinical note, LOS modified, Charge Capture section accepted

## 2017-01-17 NOTE — Progress Notes (Signed)
Cardiac Rehab Medication Review by a Pharmacist  Does the patient  feel that his/her medications are working for him/her?  yes  Has the patient been experiencing any side effects to the medications prescribed?  no  Does the patient measure his/her own blood pressure or blood glucose at home?  no   Does the patient have any problems obtaining medications due to transportation or finances?   no  Understanding of regimen: good Understanding of indications: good Potential of compliance: good    Pharmacist comments: Mr. Hattabaugh is a 51 yo male who presents to cardiac rehab without assistance. He was instructed to try and check his blood pressure at least once a week. He has no concerns about any of his current medications. He uses a pill organizer at home and his daughter assists with his medications.     Ihor Austin, PharmD PGY1 Pharmacy Resident Pager: [phone] 01/17/2017 9:14 AM

## 2017-01-24 ENCOUNTER — Ambulatory Visit (INDEPENDENT_AMBULATORY_CARE_PROVIDER_SITE_OTHER): Payer: BLUE CROSS/BLUE SHIELD | Admitting: *Deleted

## 2017-01-24 DIAGNOSIS — I82499 Acute embolism and thrombosis of other specified deep vein of unspecified lower extremity: Secondary | ICD-10-CM

## 2017-01-24 DIAGNOSIS — Z5181 Encounter for therapeutic drug level monitoring: Secondary | ICD-10-CM

## 2017-01-24 LAB — POCT INR: INR: 1.7

## 2017-01-25 ENCOUNTER — Encounter (HOSPITAL_COMMUNITY)
Admission: RE | Admit: 2017-01-25 | Discharge: 2017-01-25 | Disposition: A | Discharge status: Home / Self Care | Payer: BLUE CROSS/BLUE SHIELD | Source: Ambulatory Visit | Attending: Cardiology | Admitting: Cardiology

## 2017-01-25 DIAGNOSIS — I5022 Chronic systolic (congestive) heart failure: Secondary | ICD-10-CM

## 2017-01-25 DIAGNOSIS — Z955 Presence of coronary angioplasty implant and graft: Secondary | ICD-10-CM | POA: Diagnosis not present

## 2017-01-25 LAB — GLUCOSE, CAPILLARY
GLUCOSE-CAPILLARY: 106 mg/dL — AB (ref 65–99)
GLUCOSE-CAPILLARY: 109 mg/dL — AB (ref 65–99)

## 2017-01-25 NOTE — Progress Notes (Signed)
Daily Session Note  Patient Details  Name: [name] MRN: [id number] Date of Birth: 16-Dec-1965 Referring Provider:     CARDIAC REHAB PHASE II ORIENTATION from 01/17/2017 in [facility]  Referring Provider  Candee Furbish, MD.      Encounter Date: 01/25/2017  Check In:     Session Check In - 01/25/17 1211      Check-In   Location [facility]   Staff Present Barnet Pall, RN, BSN;Amber Fair, MS, ACSM RCEP, Exercise Physiologist;Joann Rion, RN, BSN;Carlette Carlton, RN, BSN   Supervising physician immediately available to respond to emergencies [facility] immediately available   Physician(s) Dr. Karleen Hampshire    Medication changes reported     No   Fall or balance concerns reported    No   Tobacco Cessation No Change   Warm-up and Cool-down Performed as group-led instruction   Resistance Training Performed No   VAD Patient? No     Pain Assessment   Currently in Pain? No/denies      Capillary Blood Glucose: Results for orders placed or performed during the hospital encounter of 01/25/17 (from the past 24 hour(s))  Glucose, capillary     Status: Abnormal   Collection Time: 01/25/17 11:38 AM  Result Value Ref Range   Glucose-Capillary 109 (H) 65 - 99 mg/dL  Glucose, capillary     Status: Abnormal   Collection Time: 01/25/17 12:29 PM  Result Value Ref Range   Glucose-Capillary 106 (H) 65 - 99 mg/dL      History  Smoking Status  . Never Smoker  Smokeless Tobacco  . Never Used    Goals Met:  Exercise tolerated well Personal goals reviewed No report of cardiac concerns or symptoms  Goals Unmet:  Not Applicable  Comments:   Pt started full exercise in phase II cardiac rehab today.  Pt tolerated light exercise without difficulty. VSS, telemetry-SR , asymptomatic.  Medication list reconciled. Pt denies barriers to medicaiton compliance.  PSYCHOSOCIAL ASSESSMENT:  PHQ-1. Pt exhibits positive coping skills,  hopeful outlook with supportive family. Pt admits he is guarded with his interaction and has some trust issues that he wishes not to discuss with rehab staff.  Pt completed pre assessment quality of life survey.  Pt scored the following     Quality of Life - 01/17/17 0957      Quality of Life Scores   Health/Function Pre 13.3 %   Socioeconomic Pre 18.13 %   Psych/Spiritual Pre 17.14 %   Family Pre 19.9 %   GLOBAL Pre 16.11 %        No psychosocial needs identified at this time, no psychosocial interventions necessary.    Pt enjoys watching TV- sports,old movies,documentaries, spending time with 43 year old son.  Pt desires to increase strength and stamina with long term goal to increase EF, heart failure, do activities with 10 yr without feeling fatigue and exhautstion.  Pt oriented to exercise equipment and routine.   Understanding verbalized. Maurice Small RN, BSN Cardiac and Pulmonary Rehab Nurse Navigator      Dr. Fransico Him is Medical Director for Cardiac Rehab at [facility].

## 2017-01-26 ENCOUNTER — Ambulatory Visit (INDEPENDENT_AMBULATORY_CARE_PROVIDER_SITE_OTHER): Payer: BLUE CROSS/BLUE SHIELD

## 2017-01-26 DIAGNOSIS — Z95810 Presence of automatic (implantable) cardiac defibrillator: Secondary | ICD-10-CM

## 2017-01-26 DIAGNOSIS — I5022 Chronic systolic (congestive) heart failure: Secondary | ICD-10-CM | POA: Diagnosis not present

## 2017-01-26 NOTE — Progress Notes (Signed)
Cardiac Individual Treatment Plan  Patient Details  Name: [name] MRN: [id number] Date of Birth: 1966-05-05 Referring Provider:     [facility] from 01/17/2017 in [place]  Referring Provider  Candee Furbish, MD.      Initial Encounter Date:    CARDIAC REHAB PHASE II ORIENTATION from 01/17/2017 in [facility]  Date  01/17/17  Referring Provider  Candee Furbish, MD.      Visit Diagnosis: 11/25/16 Stented coronary artery  Heart failure, chronic systolic (HCC)  Patient's Home Medications on Admission:  Current Outpatient Prescriptions:  .  atorvastatin (LIPITOR) 80 MG tablet, Take 1 tablet (80 mg total) by mouth daily at 6 PM., Disp: 30 tablet, Rfl: 6 .  carvedilol (COREG) 6.25 MG tablet, Take 1 tablet (6.25 mg total) by mouth 2 (two) times daily., Disp: 60 tablet, Rfl: 6 .  clopidogrel (PLAVIX) 75 MG tablet, Take 1 tablet (75 mg total) by mouth daily., Disp: 30 tablet, Rfl: 6 .  digoxin (LANOXIN) 0.125 MG tablet, Take 1 tablet (0.125 mg total) by mouth daily., Disp: 90 tablet, Rfl: 3 .  hydrALAZINE (APRESOLINE) 50 MG tablet, Take 1.5 tablets (75 mg total) by mouth 3 (three) times daily., Disp: 135 tablet, Rfl: 6 .  insulin glargine (LANTUS) 100 UNIT/ML injection, Inject 35 Units into the skin at bedtime. , Disp: , Rfl:  .  isosorbide mononitrate (IMDUR) 60 MG 24 hr tablet, Take 1.5 tablets (90 mg total) by mouth daily., Disp: 45 tablet, Rfl: 6 .  metoCLOPramide (REGLAN) 5 MG tablet, Take one tablet 1/2 hr before meals., Disp: 90 tablet, Rfl: 2 .  nitroGLYCERIN (NITROSTAT) 0.4 MG SL tablet, Place 1 tablet (0.4 mg total) under the tongue every 5 (five) minutes as needed for chest pain., Disp: 25 tablet, Rfl: 3 .  ondansetron (ZOFRAN ODT) 4 MG disintegrating tablet, Take 1 tablet (4 mg total) by mouth every 6 (six) hours as needed for nausea or vomiting., Disp: 40 tablet, Rfl: 1 .  ranolazine  (RANEXA) 500 MG 12 hr tablet, Take 1 tablet (500 mg total) by mouth 2 (two) times daily., Disp: 60 tablet, Rfl: 3 .  torsemide (DEMADEX) 20 MG tablet, Take 3 tablets (60 mg total) by mouth daily., Disp: 90 tablet, Rfl: 3 .  warfarin (COUMADIN) 5 MG tablet, Take as directed by coumadin clinic, Disp: 50 tablet, Rfl: 1  Past Medical History: Past Medical History:  Diagnosis Date  . Angina   . ASCVD (arteriosclerotic cardiovascular disease)    , Anterior infarction 2005, LAD diagonal bifurcation intervention 03/2004  . Automatic implantable cardiac defibrillator -[employer]       . Benign neoplasm of colon   . Chronic systolic heart failure ([place])   . Coronary artery disease     Widely patent previously placed stents in the left anterior   . Crohn's disease ([place])   . 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 Gastroparesis   . GERD (gastroesophageal reflux disease)   . High cholesterol   . Hyperlipidemia   . Hypersomnolent    Previous diagnosis of narcolepsy  . Hypertension, essential   . Ischemic cardiomyopathy    Ejection fraction 15-20% catheterization 2010  . Type II or unspecified type diabetes mellitus without mention of complication, not stated as uncontrolled   . Unspecified gastritis and gastroduodenitis without mention of hemorrhage     Tobacco Use: History  Smoking Status  . Never  Smoker  Smokeless Tobacco  . Never Used    Labs: Recent Review Flowsheet Data    Labs for ITP Cardiac and Pulmonary Rehab Latest Ref Rng & Units 11/24/2016 11/25/2016 11/26/2016 11/27/2016 11/28/2016   Cholestrol 0 - 200 mg/dL - - - - -   LDLCALC 0 - 99 mg/dL - - - - -   HDL >40 mg/dL - - - - -   Trlycerides <150 mg/dL - - - - -   Hemoglobin A1c 4.8 - 5.6 % - - - - -   HCO3 20.0 - 28.0 mmol/L - - - - -   TCO2 0 - 100 mmol/L - - - - -   O2SAT % 73.3 72.6 75.9 59.5 71.0      Capillary Blood Glucose: Lab Results  Component Value Date   GLUCAP 106 (H) 01/25/2017   GLUCAP  109 (H) 01/25/2017   GLUCAP 172 (H) 12/06/2016   GLUCAP 95 12/06/2016   GLUCAP 179 (H) 12/05/2016     Exercise Target Goals:    Exercise Program Goal: Individual exercise prescription set with THRR, safety & activity barriers. Participant demonstrates ability to understand and report RPE using BORG scale, to self-measure pulse accurately, and to acknowledge the importance of the exercise prescription.  Exercise Prescription Goal: Starting with aerobic activity 30 plus minutes a day, 3 days per week for initial exercise prescription. Provide home exercise prescription and guidelines that participant acknowledges understanding prior to discharge.  Activity Barriers & Risk Stratification:     Activity Barriers & Cardiac Risk Stratification - 01/17/17 0903      Activity Barriers & Cardiac Risk Stratification   Activity Barriers Deconditioning;Muscular Weakness;Shortness of Breath   Cardiac Risk Stratification High      6 Minute Walk:     6 Minute Walk    Row Name 01/17/17 1011         6 Minute Walk   Phase Initial     Distance 965 feet     Walk Time 6 minutes     # of Rest Breaks 0     MPH 1.83     METS 3.03     RPE 10     VO2 Peak 10.6     Symptoms No     Resting HR 74 bpm     Resting BP 114/60     Max Ex. HR 84 bpm     Max Ex. BP 122/80     2 Minute Post BP 120/70        Oxygen Initial Assessment:   Oxygen Re-Evaluation:   Oxygen Discharge (Final Oxygen Re-Evaluation):   Initial Exercise Prescription:     Initial Exercise Prescription - 01/17/17 1000      Date of Initial Exercise RX and Referring Provider   Date 01/17/17   Referring Provider Candee Furbish, MD.     Treadmill   MPH 1.8   Grade 0   Minutes 10   METs 2.38     Bike   Level 0.5   Minutes 10   METs 2.11     NuStep   Level 2   SPM 80   Minutes 10   METs 2     Prescription Details   Frequency (times per week) 3   Duration Progress to 30 minutes of continuous aerobic  without signs/symptoms of physical distress     Intensity   THRR 40-80% of Max Heartrate 67-134   Ratings of Perceived Exertion 11-13   Perceived Dyspnea  0-4     Progression   Progression Continue to progress workloads to maintain intensity without signs/symptoms of physical distress.     Resistance Training   Training Prescription Yes   Weight 2lbs   Reps 10-15      Perform Capillary Blood Glucose checks as needed.  Exercise Prescription Changes:     Exercise Prescription Changes    Row Name 01/25/17 1400             Response to Exercise   Blood Pressure (Admit) 122/78       Blood Pressure (Exercise) 142/78       Blood Pressure (Exit) 124/68       Heart Rate (Admit) 79 bpm       Heart Rate (Exercise) 100 bpm       Heart Rate (Exit) 79 bpm       Rating of Perceived Exertion (Exercise) 13       Comments fluid index elevated       Duration Continue with 30 min of aerobic exercise without signs/symptoms of physical distress.       Intensity THRR unchanged         Progression   Progression Continue to progress workloads to maintain intensity without signs/symptoms of physical distress.       Average METs 2.1         Resistance Training   Training Prescription Yes       Weight 2lbs       Reps 10-15       Time 10 Minutes         Treadmill   MPH 1.8       Grade 0       Minutes 10       METs 2.38         Bike   Level 0.5       Minutes 10       METs 2.11         NuStep   Level 2       SPM 80       Minutes 10       METs 1.7          Exercise Comments:     Exercise Comments    Row Name 01/25/17 1451           Exercise Comments Pt was oriented to exercise equipment today. Pt responded well to exercise session will f/u          Exercise Goals and Review:     Exercise Goals    Row Name 01/17/17 0905             Exercise Goals   Increase Physical Activity Yes       Intervention Provide advice, education, support and counseling about  physical activity/exercise needs.;Develop an individualized exercise prescription for aerobic and resistive training based on initial evaluation findings, risk stratification, comorbidities and participant's personal goals.       Expected Outcomes Achievement of increased cardiorespiratory fitness and enhanced flexibility, muscular endurance and strength shown through measurements of functional capacity and personal statement of participant.       Increase Strength and Stamina Yes  Improve EF and be able to perform activities without fatigue and exhaustion       Intervention Provide advice, education, support and counseling about physical activity/exercise needs.;Develop an individualized exercise prescription for aerobic and resistive training based on initial evaluation findings, risk stratification, comorbidities and participant's personal goals.  Expected Outcomes Achievement of increased cardiorespiratory fitness and enhanced flexibility, muscular endurance and strength shown through measurements of functional capacity and personal statement of participant.          Exercise Goals Re-Evaluation :    Discharge Exercise Prescription (Final Exercise Prescription Changes):     Exercise Prescription Changes - 01/25/17 1400      Response to Exercise   Blood Pressure (Admit) 122/78   Blood Pressure (Exercise) 142/78   Blood Pressure (Exit) 124/68   Heart Rate (Admit) 79 bpm   Heart Rate (Exercise) 100 bpm   Heart Rate (Exit) 79 bpm   Rating of Perceived Exertion (Exercise) 13   Comments fluid index elevated   Duration Continue with 30 min of aerobic exercise without signs/symptoms of physical distress.   Intensity THRR unchanged     Progression   Progression Continue to progress workloads to maintain intensity without signs/symptoms of physical distress.   Average METs 2.1     Resistance Training   Training Prescription Yes   Weight 2lbs   Reps 10-15   Time 10 Minutes      Treadmill   MPH 1.8   Grade 0   Minutes 10   METs 2.38     Bike   Level 0.5   Minutes 10   METs 2.11     NuStep   Level 2   SPM 80   Minutes 10   METs 1.7      Nutrition:  Target Goals: Understanding of nutrition guidelines, daily intake of sodium <1588m, cholesterol <2056m calories 30% from fat and 7% or less from saturated fats, daily to have 5 or more servings of fruits and vegetables.  Biometrics:     Pre Biometrics - 01/17/17 1017      Pre Biometrics   Height 5' 8"  (1.727 m)   Weight 189 lb 2.5 oz (85.8 kg)   Waist Circumference 39.75 inches   Hip Circumference 43 inches   Waist to Hip Ratio 0.92 %   BMI (Calculated) 28.8   Triceps Skinfold 26 mm   % Body Fat 29.6 %   Grip Strength 31 kg   Flexibility 7.5 in   Single Leg Stand 2.56 seconds       Nutrition Therapy Plan and Nutrition Goals:   Nutrition Discharge: Nutrition Scores:   Nutrition Goals Re-Evaluation:   Nutrition Goals Re-Evaluation:   Nutrition Goals Discharge (Final Nutrition Goals Re-Evaluation):   Psychosocial: Target Goals: Acknowledge presence or absence of significant depression and/or stress, maximize coping skills, provide positive support system. Participant is able to verbalize types and ability to use techniques and skills needed for reducing stress and depression.  Initial Review & Psychosocial Screening:     Initial Psych Review & Screening - 01/17/17 0844      Initial Review   Current issues with None Identified     Family Dynamics   Good Support System? Yes  family, friends    Comments upon brief assessment, no psychosocial needs identified, no interventions necessary      Barriers   Psychosocial barriers to participate in program There are no identifiable barriers or psychosocial needs.     Screening Interventions   Interventions Encouraged to exercise;Provide feedback about the scores to participant      Quality of Life Scores:     Quality of Life  - 01/17/17 0957      Quality of Life Scores   Health/Function Pre 13.3 %   Socioeconomic Pre 18.13 %  Psych/Spiritual Pre 17.14 %   Family Pre 19.9 %   GLOBAL Pre 16.11 %      PHQ-9: Recent Review Flowsheet Data    Depression screen [facility] 2/9 01/25/2017 01/17/2017 11/10/2016 11/09/2016   Decreased Interest 0 0 0 0   Down, Depressed, Hopeless 1 0 0 0   PHQ - 2 Score 1 0 0 0     Interpretation of Total Score  Total Score Depression Severity:  1-4 = Minimal depression, 5-9 = Mild depression, 10-14 = Moderate depression, 15-19 = Moderately severe depression, 20-27 = Severe depression   Psychosocial Evaluation and Intervention:     Psychosocial Evaluation - 01/26/17 1258      Psychosocial Evaluation & Interventions   Interventions Stress management education;Relaxation education;Encouraged to exercise with the program and follow exercise prescription   Comments pt guarded with responses but feels supported by family   Continue Psychosocial Services  Follow up required by staff      Psychosocial Re-Evaluation:   Psychosocial Discharge (Final Psychosocial Re-Evaluation):   Vocational Rehabilitation: Provide vocational rehab assistance to qualifying candidates.   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 Participant will state understanding/return demonstration of topics presented.  Learning Barriers/Preferences:     Learning Barriers/Preferences - 01/17/17 0843      Learning Barriers/Preferences   Learning Barriers Sight   Learning Preferences Written Material      Education Topics: Count Your Pulse:  -Group instruction  provided by verbal instruction, demonstration, patient participation and written materials to support subject.  Instructors address importance of being able to find your pulse and how to count your pulse when at home without a heart monitor.  Patients get hands on experience counting their pulse with staff help and individually.   Heart Attack, Angina, and Risk Factor Modification:  -Group instruction provided by verbal instruction, video, and written materials to support subject.  Instructors address signs and symptoms of angina and heart attacks.    Also discuss risk factors for heart disease and how to make changes to improve heart health risk factors.   Functional Fitness:  -Group instruction provided by verbal instruction, demonstration, patient participation, and written materials to support subject.  Instructors address safety measures for doing things around the house.  Discuss how to get up and down off the floor, how to pick things up properly, how to safely get out of a chair without assistance, and balance training.   Meditation and Mindfulness:  -Group instruction provided by verbal instruction, patient participation, and written materials to support subject.  Instructor addresses importance of mindfulness and meditation practice to help reduce stress and improve awareness.  Instructor also leads participants through a meditation exercise.    Stretching for Flexibility and Mobility:  -Group instruction provided by verbal instruction, patient participation, and written materials to support subject.  Instructors lead participants through series of stretches that are designed to increase flexibility thus improving mobility.  These stretches are additional exercise for major muscle groups that are typically performed during regular warm up and cool down.   Hands Only CPR:  -Group verbal, video, and participation provides a basic overview of AHA  guidelines for community CPR. Role-play of  emergencies allow participants the opportunity to practice calling for help and chest compression technique with discussion of AED use.   Hypertension: -Group verbal and written instruction that provides a basic overview of hypertension including the most recent diagnostic guidelines, risk factor reduction with self-care instructions and medication management.    Nutrition I class: Heart Healthy Eating:  -Group instruction provided by PowerPoint slides, verbal discussion, and written materials to support subject matter. The instructor gives an explanation and review of the Therapeutic Lifestyle Changes diet recommendations, which includes a discussion on lipid goals, dietary fat, sodium, fiber, plant stanol/sterol esters, sugar, and the components of a well-balanced, healthy diet.   Nutrition II class: Lifestyle Skills:  -Group instruction provided by PowerPoint slides, verbal discussion, and written materials to support subject matter. The instructor gives an explanation and review of label reading, grocery shopping for heart health, heart healthy recipe modifications, and ways to make healthier choices when eating out.   Diabetes Question & Answer:  -Group instruction provided by PowerPoint slides, verbal discussion, and written materials to support subject matter. The instructor gives an explanation and review of diabetes co-morbidities, pre- and post-prandial blood glucose goals, pre-exercise blood glucose goals, signs, symptoms, and treatment of hypoglycemia and hyperglycemia, and foot care basics.   Diabetes Blitz:  -Group instruction provided by PowerPoint slides, verbal discussion, and written materials to support subject matter. The instructor gives an explanation and review of the physiology behind type 1 and type 2 diabetes, diabetes medications and rational behind using different medications, pre- and post-prandial blood glucose recommendations and Hemoglobin A1c goals, diabetes  diet, and exercise including blood glucose guidelines for exercising safely.    Portion Distortion:  -Group instruction provided by PowerPoint slides, verbal discussion, written materials, and food models to support subject matter. The instructor gives an explanation of serving size versus portion size, changes in portions sizes over the last 20 years, and what consists of a serving from each food group.   Stress Management:  -Group instruction provided by verbal instruction, video, and written materials to support subject matter.  Instructors review role of stress in heart disease and how to cope with stress positively.     Exercising on Your Own:  -Group instruction provided by verbal instruction, power point, and written materials to support subject.  Instructors discuss benefits of exercise, components of exercise, frequency and intensity of exercise, and end points for exercise.  Also discuss use of nitroglycerin and activating EMS.  Review options of places to exercise outside of rehab.  Review guidelines for sex with heart disease.   Cardiac Drugs I:  -Group instruction provided by verbal instruction and written materials to support subject.  Instructor reviews cardiac drug classes: antiplatelets, anticoagulants, beta blockers, and statins.  Instructor discusses reasons, side effects, and lifestyle considerations for each drug class.   Cardiac Drugs II:  -Group instruction provided by verbal instruction and written materials to support subject.  Instructor reviews cardiac drug classes: angiotensin converting enzyme inhibitors (ACE-I), angiotensin II receptor blockers (ARBs), nitrates, and calcium channel blockers.  Instructor discusses reasons, side effects, and lifestyle considerations for each drug class.   Anatomy and Physiology of the Circulatory System:  Group verbal and written instruction and models provide basic cardiac anatomy and physiology, with the coronary electrical and  arterial systems. Review of: AMI, Angina, Valve disease, Heart Failure, Peripheral Artery Disease, Cardiac Arrhythmia, Pacemakers, and the ICD.   Other Education:  -Group or individual verbal, written,  or video instructions that support the educational goals of the cardiac rehab program.   Knowledge Questionnaire Score:     Knowledge Questionnaire Score - 01/17/17 0957      Knowledge Questionnaire Score   Pre Score 23/24      Core Components/Risk Factors/Patient Goals at Admission:     Personal Goals and Risk Factors at Admission - 01/17/17 1125      Core Components/Risk Factors/Patient Goals on Admission   Diabetes Yes   Intervention Provide education about signs/symptoms and action to take for hypo/hyperglycemia.;Provide education about proper nutrition, including hydration, and aerobic/resistive exercise prescription along with prescribed medications to achieve blood glucose in normal ranges: Fasting glucose 65-99 mg/dL   Expected Outcomes Short Term: Participant verbalizes understanding of the signs/symptoms and immediate care of hyper/hypoglycemia, proper foot care and importance of medication, aerobic/resistive exercise and nutrition plan for blood glucose control.;Long Term: Attainment of HbA1C < 7%.   Heart Failure Yes   Intervention Provide a combined exercise and nutrition program that is supplemented with education, support and counseling about heart failure. Directed toward relieving symptoms such as shortness of breath, decreased exercise tolerance, and extremity edema.   Expected Outcomes Improve functional capacity of life;Short term: Attendance in program 2-3 days a week with increased exercise capacity. Reported lower sodium intake. Reported increased fruit and vegetable intake. Reports medication compliance.;Short term: Daily weights obtained and reported for increase. Utilizing diuretic protocols set by physician.;Long term: Adoption of self-care skills and reduction of  barriers for early signs and symptoms recognition and intervention leading to self-care maintenance.   Hypertension Yes   Intervention Provide education on lifestyle modifcations including regular physical activity/exercise, weight management, moderate sodium restriction and increased consumption of fresh fruit, vegetables, and low fat dairy, alcohol moderation, and smoking cessation.;Monitor prescription use compliance.   Expected Outcomes Short Term: Continued assessment and intervention until BP is < 140/3m HG in hypertensive participants. < 130/859mHG in hypertensive participants with diabetes, heart failure or chronic kidney disease.;Long Term: Maintenance of blood pressure at goal levels.   Lipids Yes   Intervention Provide education and support for participant on nutrition & aerobic/resistive exercise along with prescribed medications to achieve LDL <7058mHDL >54m76m Expected Outcomes Short Term: Participant states understanding of desired cholesterol values and is compliant with medications prescribed. Participant is following exercise prescription and nutrition guidelines.;Long Term: Cholesterol controlled with medications as prescribed, with individualized exercise RX and with personalized nutrition plan. Value goals: LDL < 70mg39mL > 40 mg.      Core Components/Risk Factors/Patient Goals Review:      Goals and Risk Factor Review    Row Name 01/26/17 1257             Core Components/Risk Factors/Patient Goals Review   Personal Goals Review Heart Failure;Lipids;Diabetes;Hypertension;Improve shortness of breath with ADL's       Review Pt has completed 2 exercise sessions, will continue to monitor.          Core Components/Risk Factors/Patient Goals at Discharge (Final Review):      Goals and Risk Factor Review - 01/26/17 1257      Core Components/Risk Factors/Patient Goals Review   Personal Goals Review Heart Failure;Lipids;Diabetes;Hypertension;Improve shortness of  breath with ADL's   Review Pt has completed 2 exercise sessions, will continue to monitor.      ITP Comments:     ITP Comments    Row Name 01/17/17 0820           ITP  Comments Dr. Fransico Him, Medical Director           Comments:  Pt is off to a good start  toward personal goals after completing 2 sessions.PSYCHOSOCIAL ASSESSMENT Pt exhibits positive coping skills, hopeful outlook with supportive family. Pt admits he is guarded with his interaction and has some trust issues that he wishes not to discuss with rehab staff at this time.  Will continue to work toward creating a trusting relationship and build rapport. Recommend continued exercise and life style modification education including  stress management and relaxation techniques to decrease cardiac risk profile. PSYCHOSOCIAL ASSESSMENT

## 2017-01-27 ENCOUNTER — Encounter (HOSPITAL_COMMUNITY)
Admission: RE | Admit: 2017-01-27 | Discharge: 2017-01-27 | Disposition: A | Discharge status: Home / Self Care | Payer: BLUE CROSS/BLUE SHIELD | Source: Ambulatory Visit | Attending: Cardiology | Admitting: Cardiology

## 2017-01-27 ENCOUNTER — Telehealth: Payer: Self-pay

## 2017-01-27 DIAGNOSIS — I5022 Chronic systolic (congestive) heart failure: Secondary | ICD-10-CM

## 2017-01-27 DIAGNOSIS — Z955 Presence of coronary angioplasty implant and graft: Secondary | ICD-10-CM | POA: Diagnosis not present

## 2017-01-27 LAB — GLUCOSE, CAPILLARY
GLUCOSE-CAPILLARY: 129 mg/dL — AB (ref 65–99)
GLUCOSE-CAPILLARY: 144 mg/dL — AB (ref 65–99)

## 2017-01-27 NOTE — Telephone Encounter (Signed)
Remote ICM transmission received.  Attempted patient call and left message to return call.   

## 2017-01-27 NOTE — Progress Notes (Signed)
EPIC Encounter for ICM Monitoring  Patient Name: [name] is a 51 y.o. male Date: 01/27/2017 Primary Care Physican: [name], MD Primary Cardiologist: [facility] Electrophysiologist: Caryl Comes Dry Weight:Last ICM weight 187 lbs      Attempted call to patient and unable to reach.  Left message to return call.  Transmission reviewed.    Thoracic impedance normal   Prescribed and confirmed dosage: Torsemide 20 mg 3 tablets (60 mg total) daily   Labs: 01/03/2017 Creatinine 1.88, BUN 39, Potassium 4.6, Sodium 139, EGFR 39-46 12/28/2016 Creatinine 1.87, BUN 35, Potassium 4.1, Sodium 140, EGFR 40-46 12/21/2016 Creatinine 1.88, BUN 37, Potassium 4.8. Sodium 139, EGFR 39-46 12/13/2016 Creatinine 1.95, BUN 75, Potassium 4.7, Sodium 137, EGFR 38-44 12/08/2016 Creatinine 1.67, BUN 70, Potassium 5.7, Sodium 138, EGFR 46-54  12/06/2016 Creatinine 2.08, BUN 84, Potassium 5.1, Sodium 135, EGFR 35-40  12/05/2016 Creatinine 2.10, BUN 85, Potassium 6.1, Sodium 131, EGFR 35-40  03/26/2018Creatinine 1.38, BUN 34, Potassium 4.8, Sodium 137, EGFR 57->60  03/25/2018Creatinine 1.51, BUN 30, Potassium 5.1, Sodium 135, EGFR 51-60  03/24/2018Creatinine 1.57, BUN 27, Potassium 5.2, Sodium 138, EGFR 49-57  03/23/2018Creatinine 1.69, BUN 34, Potassium 4.8, Sodium 134, EGFR 45-52  11/24/2016 Creatinine 1.92, BUN 37, Potassium 4.9, Sodium 134, EGFR 38-45  11/23/2016 Creatinine 1.92, BUN 41, Potassium 4.5, Sodium 130, EGFR 38-45  11/22/2016 Creatinine 2.00, BUN 44, Potassium 4.8, Sodium 130, EGFR 37-42 11/21/2016 Creatinine 2.69, BUN 53, Potassium 4.3, Sodium 133, EGFR 26-30  11/20/2016 Creatinine 2.63, BUN 51, Potassium 4.9, Sodium 133, EGFR 26-30  11/19/2016 Creatinine 1.65, BUN 38, Potassium 4.8, Sodium 131, EGFR 46-54  03/16/2018Creatinine 1.35, BUN 29, Potassium 4.2, Sodium 138, EGFR >60  03/07/2018Creatinine 1.63, BUN 37, Potassium 4.7, Sodium 137    01/03/2018Creatinine 1.47, BUN 34, Potassium 4.1, Sodium 140, EGFR 54-63   Recommendations: NONE - Unable to reach patient   Follow-up plan: ICM clinic phone appointment on 02/27/2017.    Copy of ICM check sent to device physician.   3 month ICM trend: 01/27/2017   1 Year ICM trend:      Rosalene Billings, RN 01/27/2017 7:33 AM

## 2017-02-01 ENCOUNTER — Telehealth (HOSPITAL_COMMUNITY): Payer: Self-pay | Admitting: *Deleted

## 2017-02-01 ENCOUNTER — Encounter (HOSPITAL_COMMUNITY)
Admission: RE | Admit: 2017-02-01 | Discharge: 2017-02-01 | Disposition: A | Discharge status: Home / Self Care | Payer: BLUE CROSS/BLUE SHIELD | Source: Ambulatory Visit | Attending: Cardiology | Admitting: Cardiology

## 2017-02-01 DIAGNOSIS — I5022 Chronic systolic (congestive) heart failure: Secondary | ICD-10-CM

## 2017-02-01 DIAGNOSIS — Z955 Presence of coronary angioplasty implant and graft: Secondary | ICD-10-CM | POA: Diagnosis not present

## 2017-02-01 LAB — GLUCOSE, CAPILLARY
GLUCOSE-CAPILLARY: 136 mg/dL — AB (ref 65–99)
GLUCOSE-CAPILLARY: 143 mg/dL — AB (ref 65–99)

## 2017-02-01 NOTE — Telephone Encounter (Signed)
[name] called stating patients weight was up 6.3lbs since Friday.  Patient is asymptomatic no shortness of breath, edema, lungs clear, O2 100%, bp 134/86. Patient states he feels "fine".  Per [name] patient should just monitor his weight and call us if he has any symptoms.  If symptoms develop he should take torsemide 100m once and call [place] next day. Patient currently in cardiac rehab left detailed message on his voicemail.

## 2017-02-01 NOTE — Progress Notes (Signed)
[name]'s weight was up 6.3 kg from his last exercise session on 01/27/2017. [name] denies shortness of breath. Upon assessment lung fields essentially clear  With slightly diminished breath sounds left base. No peripheral edema noted. Abdomen soft and large. Oxygen saturation 100% on room air. Dr Claris Gladden office called and notified about today's weight gain. [name] says he is taking his medications as prescribed. No complaints with exercise today.Will continue to monitor the patient throughout  the program.Trena Dunavan Venetia Maxon, RN,BSN 02/01/2017 12:40 PM

## 2017-02-03 ENCOUNTER — Other Ambulatory Visit: Payer: Self-pay | Admitting: Internal Medicine

## 2017-02-03 ENCOUNTER — Encounter (HOSPITAL_COMMUNITY)
Admission: RE | Admit: 2017-02-03 | Discharge: 2017-02-03 | Disposition: A | Discharge status: Home / Self Care | Payer: BLUE CROSS/BLUE SHIELD | Source: Ambulatory Visit | Attending: Cardiology | Admitting: Cardiology

## 2017-02-03 DIAGNOSIS — Z955 Presence of coronary angioplasty implant and graft: Secondary | ICD-10-CM | POA: Diagnosis not present

## 2017-02-03 DIAGNOSIS — I5023 Acute on chronic systolic (congestive) heart failure: Secondary | ICD-10-CM | POA: Diagnosis not present

## 2017-02-03 DIAGNOSIS — I5022 Chronic systolic (congestive) heart failure: Secondary | ICD-10-CM

## 2017-02-03 LAB — GLUCOSE, CAPILLARY: Glucose-Capillary: 129 mg/dL — ABNORMAL HIGH (ref 65–99)

## 2017-02-06 ENCOUNTER — Encounter (HOSPITAL_COMMUNITY)
Admission: RE | Admit: 2017-02-06 | Discharge: 2017-02-06 | Disposition: A | Discharge status: Home / Self Care | Payer: BLUE CROSS/BLUE SHIELD | Source: Ambulatory Visit | Attending: Cardiology | Admitting: Cardiology

## 2017-02-06 DIAGNOSIS — I5022 Chronic systolic (congestive) heart failure: Secondary | ICD-10-CM

## 2017-02-06 DIAGNOSIS — Z955 Presence of coronary angioplasty implant and graft: Secondary | ICD-10-CM

## 2017-02-06 LAB — GLUCOSE, CAPILLARY: GLUCOSE-CAPILLARY: 153 mg/dL — AB (ref 65–99)

## 2017-02-06 NOTE — Telephone Encounter (Signed)
Cardiac rehab called again to report patient has had an additional 5 lb weight gain from last week. Patient is also complaining of feeling tired and having some shortness of breath.  Patient stated he didn't get the VM.  I spoke with Oda Kilts, PA and he now wants patient to take 80 mg of Torsemide for 2 days and to call us back if he doesn't start to feel better. Patient is agreeable with plan and no further questions at this time.

## 2017-02-08 ENCOUNTER — Inpatient Hospital Stay (HOSPITAL_COMMUNITY)
Admission: AD | Admit: 2017-02-08 | Discharge: 2017-02-13 | DRG: 291 | Disposition: A | Discharge status: Home / Self Care | Payer: BLUE CROSS/BLUE SHIELD | Source: Ambulatory Visit | Attending: Cardiology | Admitting: Cardiology

## 2017-02-08 ENCOUNTER — Telehealth: Payer: Self-pay | Admitting: Cardiology

## 2017-02-08 ENCOUNTER — Encounter (HOSPITAL_COMMUNITY): Payer: BLUE CROSS/BLUE SHIELD

## 2017-02-08 ENCOUNTER — Encounter (HOSPITAL_COMMUNITY): Payer: Self-pay

## 2017-02-08 ENCOUNTER — Ambulatory Visit (HOSPITAL_COMMUNITY)
Admission: RE | Admit: 2017-02-08 | Discharge: 2017-02-08 | Disposition: A | Discharge status: Home / Self Care | Payer: BLUE CROSS/BLUE SHIELD | Source: Ambulatory Visit | Attending: Internal Medicine | Admitting: Internal Medicine

## 2017-02-08 ENCOUNTER — Telehealth (HOSPITAL_COMMUNITY): Payer: Self-pay | Admitting: *Deleted

## 2017-02-08 ENCOUNTER — Encounter (HOSPITAL_COMMUNITY): Payer: Self-pay | Admitting: General Practice

## 2017-02-08 VITALS — BP 178/86 | HR 95 | Wt 204.4 lb

## 2017-02-08 DIAGNOSIS — Z7901 Long term (current) use of anticoagulants: Secondary | ICD-10-CM

## 2017-02-08 DIAGNOSIS — I509 Heart failure, unspecified: Secondary | ICD-10-CM

## 2017-02-08 DIAGNOSIS — Z95810 Presence of automatic (implantable) cardiac defibrillator: Secondary | ICD-10-CM

## 2017-02-08 DIAGNOSIS — Z794 Long term (current) use of insulin: Secondary | ICD-10-CM | POA: Diagnosis not present

## 2017-02-08 DIAGNOSIS — I252 Old myocardial infarction: Secondary | ICD-10-CM

## 2017-02-08 DIAGNOSIS — I5023 Acute on chronic systolic (congestive) heart failure: Secondary | ICD-10-CM | POA: Diagnosis present

## 2017-02-08 DIAGNOSIS — I5043 Acute on chronic combined systolic (congestive) and diastolic (congestive) heart failure: Secondary | ICD-10-CM | POA: Diagnosis present

## 2017-02-08 DIAGNOSIS — I255 Ischemic cardiomyopathy: Secondary | ICD-10-CM | POA: Diagnosis present

## 2017-02-08 DIAGNOSIS — K3184 Gastroparesis: Secondary | ICD-10-CM | POA: Diagnosis present

## 2017-02-08 DIAGNOSIS — E1143 Type 2 diabetes mellitus with diabetic autonomic (poly)neuropathy: Secondary | ICD-10-CM | POA: Diagnosis present

## 2017-02-08 DIAGNOSIS — N183 Chronic kidney disease, stage 3 unspecified: Secondary | ICD-10-CM

## 2017-02-08 DIAGNOSIS — Z79899 Other long term (current) drug therapy: Secondary | ICD-10-CM | POA: Diagnosis not present

## 2017-02-08 DIAGNOSIS — I10 Essential (primary) hypertension: Secondary | ICD-10-CM

## 2017-02-08 DIAGNOSIS — E1122 Type 2 diabetes mellitus with diabetic chronic kidney disease: Secondary | ICD-10-CM | POA: Diagnosis present

## 2017-02-08 DIAGNOSIS — Z9861 Coronary angioplasty status: Secondary | ICD-10-CM | POA: Diagnosis not present

## 2017-02-08 DIAGNOSIS — I130 Hypertensive heart and chronic kidney disease with heart failure and stage 1 through stage 4 chronic kidney disease, or unspecified chronic kidney disease: Principal | ICD-10-CM | POA: Diagnosis present

## 2017-02-08 DIAGNOSIS — I2510 Atherosclerotic heart disease of native coronary artery without angina pectoris: Secondary | ICD-10-CM | POA: Diagnosis present

## 2017-02-08 DIAGNOSIS — I5022 Chronic systolic (congestive) heart failure: Secondary | ICD-10-CM

## 2017-02-08 DIAGNOSIS — N179 Acute kidney failure, unspecified: Secondary | ICD-10-CM | POA: Diagnosis not present

## 2017-02-08 DIAGNOSIS — I110 Hypertensive heart disease with heart failure: Secondary | ICD-10-CM

## 2017-02-08 DIAGNOSIS — R509 Fever, unspecified: Secondary | ICD-10-CM

## 2017-02-08 HISTORY — DX: Dyspnea, unspecified: R06.00

## 2017-02-08 LAB — PROTIME-INR
INR: 1.1
PROTHROMBIN TIME: 14.3 s (ref 11.4–15.2)

## 2017-02-08 LAB — COMPREHENSIVE METABOLIC PANEL
ALT: 35 U/L (ref 17–63)
ANION GAP: 6 (ref 5–15)
AST: 33 U/L (ref 15–41)
Albumin: 3.1 g/dL — ABNORMAL LOW (ref 3.5–5.0)
Alkaline Phosphatase: 137 U/L — ABNORMAL HIGH (ref 38–126)
BILIRUBIN TOTAL: 0.9 mg/dL (ref 0.3–1.2)
BUN: 17 mg/dL (ref 6–20)
CO2: 22 mmol/L (ref 22–32)
Calcium: 8.3 mg/dL — ABNORMAL LOW (ref 8.9–10.3)
Chloride: 113 mmol/L — ABNORMAL HIGH (ref 101–111)
Creatinine, Ser: 1.22 mg/dL (ref 0.61–1.24)
Glucose, Bld: 63 mg/dL — ABNORMAL LOW (ref 65–99)
POTASSIUM: 3.5 mmol/L (ref 3.5–5.1)
Sodium: 141 mmol/L (ref 135–145)
TOTAL PROTEIN: 6.9 g/dL (ref 6.5–8.1)

## 2017-02-08 LAB — CBC
HCT: 31.7 % — ABNORMAL LOW (ref 39.0–52.0)
Hemoglobin: 10.3 g/dL — ABNORMAL LOW (ref 13.0–17.0)
MCH: 25.6 pg — ABNORMAL LOW (ref 26.0–34.0)
MCHC: 32.5 g/dL (ref 30.0–36.0)
MCV: 78.9 fL (ref 78.0–100.0)
PLATELETS: 239 10*3/uL (ref 150–400)
RBC: 4.02 MIL/uL — AB (ref 4.22–5.81)
RDW: 19.6 % — ABNORMAL HIGH (ref 11.5–15.5)
WBC: 9 10*3/uL (ref 4.0–10.5)

## 2017-02-08 LAB — DIGOXIN LEVEL

## 2017-02-08 LAB — COOXEMETRY PANEL
Carboxyhemoglobin: 1.1 % (ref 0.5–1.5)
METHEMOGLOBIN: 1.7 % — AB (ref 0.0–1.5)
O2 Saturation: 80.4 %
TOTAL HEMOGLOBIN: 9.9 g/dL — AB (ref 12.0–16.0)

## 2017-02-08 LAB — GLUCOSE, CAPILLARY
GLUCOSE-CAPILLARY: 131 mg/dL — AB (ref 65–99)
GLUCOSE-CAPILLARY: 125 mg/dL — AB (ref 65–99)
Glucose-Capillary: 44 mg/dL — CL (ref 65–99)

## 2017-02-08 LAB — MRSA PCR SCREENING: MRSA BY PCR: NEGATIVE

## 2017-02-08 LAB — BRAIN NATRIURETIC PEPTIDE: B Natriuretic Peptide: 1330.6 pg/mL — ABNORMAL HIGH (ref 0.0–100.0)

## 2017-02-08 LAB — MAGNESIUM: Magnesium: 1.3 mg/dL — ABNORMAL LOW (ref 1.7–2.4)

## 2017-02-08 MED ORDER — RANOLAZINE ER 500 MG PO TB12
500.0000 mg | ORAL_TABLET | Freq: Two times a day (BID) | ORAL | Status: DC
  Administered 2017-02-08 – 2017-02-13 (×10): 500 mg via ORAL
  Filled 2017-02-08 (×10): qty 1

## 2017-02-08 MED ORDER — METOCLOPRAMIDE HCL 5 MG PO TABS
2.5000 mg | ORAL_TABLET | Freq: Three times a day (TID) | ORAL | Status: DC
  Administered 2017-02-08 – 2017-02-12 (×14): 2.5 mg via ORAL
  Filled 2017-02-08 (×2): qty 0.5
  Filled 2017-02-08: qty 1
  Filled 2017-02-08 (×5): qty 0.5
  Filled 2017-02-08: qty 1
  Filled 2017-02-08: qty 0.5
  Filled 2017-02-08: qty 1
  Filled 2017-02-08: qty 0.5
  Filled 2017-02-08 (×2): qty 1
  Filled 2017-02-08: qty 0.5

## 2017-02-08 MED ORDER — FUROSEMIDE 10 MG/ML IJ SOLN
80.0000 mg | Freq: Two times a day (BID) | INTRAMUSCULAR | Status: DC
  Administered 2017-02-08: 80 mg via INTRAVENOUS
  Filled 2017-02-08: qty 8

## 2017-02-08 MED ORDER — ALPRAZOLAM 0.25 MG PO TABS: 0.2500 mg | ORAL_TABLET | Freq: Two times a day (BID) | ORAL | Status: DC | PRN

## 2017-02-08 MED ORDER — ATORVASTATIN CALCIUM 80 MG PO TABS
80.0000 mg | ORAL_TABLET | Freq: Every day | ORAL | Status: DC
  Administered 2017-02-08 – 2017-02-12 (×5): 80 mg via ORAL
  Filled 2017-02-08 (×5): qty 1

## 2017-02-08 MED ORDER — ONDANSETRON 4 MG PO TBDP: 4.0000 mg | ORAL_TABLET | Freq: Four times a day (QID) | ORAL | Status: DC | PRN

## 2017-02-08 MED ORDER — CARVEDILOL 3.125 MG PO TABS
3.1250 mg | ORAL_TABLET | Freq: Two times a day (BID) | ORAL | Status: DC
  Administered 2017-02-08 – 2017-02-13 (×10): 3.125 mg via ORAL
  Filled 2017-02-08 (×10): qty 1

## 2017-02-08 MED ORDER — POTASSIUM CHLORIDE CRYS ER 20 MEQ PO TBCR
20.0000 meq | EXTENDED_RELEASE_TABLET | Freq: Every day | ORAL | Status: DC
  Administered 2017-02-08: 20 meq via ORAL
  Filled 2017-02-08: qty 1

## 2017-02-08 MED ORDER — WARFARIN SODIUM 7.5 MG PO TABS
7.5000 mg | ORAL_TABLET | Freq: Once | ORAL | Status: AC
  Administered 2017-02-08: 7.5 mg via ORAL
  Filled 2017-02-08: qty 1

## 2017-02-08 MED ORDER — DIGOXIN 125 MCG PO TABS
0.1250 mg | ORAL_TABLET | Freq: Every day | ORAL | Status: DC
  Administered 2017-02-08 – 2017-02-13 (×6): 0.125 mg via ORAL
  Filled 2017-02-08 (×6): qty 1

## 2017-02-08 MED ORDER — NITROGLYCERIN 0.4 MG SL SUBL: 0.4000 mg | SUBLINGUAL_TABLET | SUBLINGUAL | Status: DC | PRN

## 2017-02-08 MED ORDER — MAGNESIUM SULFATE 4 GM/100ML IV SOLN
4.0000 g | Freq: Once | INTRAVENOUS | Status: AC
  Administered 2017-02-08: 4 g via INTRAVENOUS
  Filled 2017-02-08: qty 100

## 2017-02-08 MED ORDER — ONDANSETRON HCL 4 MG/2ML IJ SOLN
4.0000 mg | Freq: Four times a day (QID) | INTRAMUSCULAR | Status: DC | PRN
  Administered 2017-02-10 – 2017-02-11 (×3): 4 mg via INTRAVENOUS
  Filled 2017-02-08 (×3): qty 2

## 2017-02-08 MED ORDER — ISOSORBIDE MONONITRATE ER 60 MG PO TB24
90.0000 mg | ORAL_TABLET | Freq: Every day | ORAL | Status: DC
  Administered 2017-02-08 – 2017-02-13 (×6): 90 mg via ORAL
  Filled 2017-02-08 (×6): qty 1

## 2017-02-08 MED ORDER — SODIUM CHLORIDE 0.9% FLUSH: 10.0000 mL | INTRAVENOUS | Status: DC | PRN

## 2017-02-08 MED ORDER — SODIUM CHLORIDE 0.9% FLUSH
10.0000 mL | Freq: Two times a day (BID) | INTRAVENOUS | Status: DC
  Administered 2017-02-08 – 2017-02-11 (×5): 10 mL

## 2017-02-08 MED ORDER — HYDRALAZINE HCL 50 MG PO TABS
75.0000 mg | ORAL_TABLET | Freq: Three times a day (TID) | ORAL | Status: DC
  Administered 2017-02-08 – 2017-02-10 (×6): 75 mg via ORAL
  Filled 2017-02-08 (×6): qty 1

## 2017-02-08 MED ORDER — CLOPIDOGREL BISULFATE 75 MG PO TABS
75.0000 mg | ORAL_TABLET | Freq: Every day | ORAL | Status: DC
  Administered 2017-02-08 – 2017-02-13 (×6): 75 mg via ORAL
  Filled 2017-02-08 (×6): qty 1

## 2017-02-08 MED ORDER — ACETAMINOPHEN 325 MG PO TABS
650.0000 mg | ORAL_TABLET | ORAL | Status: DC | PRN
  Administered 2017-02-09 – 2017-02-10 (×3): 650 mg via ORAL
  Filled 2017-02-08 (×3): qty 2

## 2017-02-08 MED ORDER — INSULIN ASPART 100 UNIT/ML ~~LOC~~ SOLN
0.0000 [IU] | Freq: Three times a day (TID) | SUBCUTANEOUS | Status: DC
  Administered 2017-02-10 (×2): 1 [IU] via SUBCUTANEOUS
  Administered 2017-02-11: 3 [IU] via SUBCUTANEOUS
  Administered 2017-02-11: 1 [IU] via SUBCUTANEOUS
  Administered 2017-02-12: 2 [IU] via SUBCUTANEOUS
  Administered 2017-02-12: 1 [IU] via SUBCUTANEOUS

## 2017-02-08 MED ORDER — INSULIN GLARGINE 100 UNIT/ML ~~LOC~~ SOLN
35.0000 [IU] | Freq: Every day | SUBCUTANEOUS | Status: DC
  Administered 2017-02-08 – 2017-02-09 (×2): 35 [IU] via SUBCUTANEOUS
  Filled 2017-02-08 (×2): qty 0.35

## 2017-02-08 MED ORDER — WARFARIN - PHARMACIST DOSING INPATIENT
Freq: Every day | Status: DC
  Administered 2017-02-09 – 2017-02-12 (×2)

## 2017-02-08 NOTE — Progress Notes (Signed)
Peripherally Inserted Central Catheter/Midline Placement  The IV Nurse has discussed with the patient and/or persons authorized to consent for the patient, the purpose of this procedure and the potential benefits and risks involved with this procedure.  The benefits include less needle sticks, lab draws from the catheter, and the patient may be discharged home with the catheter. Risks include, but not limited to, infection, bleeding, blood clot (thrombus formation), and puncture of an artery; nerve damage and irregular heartbeat and possibility to perform a PICC exchange if needed/ordered by physician.  Alternatives to this procedure were also discussed.  Bard Power PICC patient education guide, fact sheet on infection prevention and patient information card has been provided to patient /or left at bedside.    PICC/Midline Placement Documentation  PICC Double Lumen 02/08/17 PICC Right Brachial 42 cm 1 cm (Active)  Indication for Insertion or Continuance of Line Vasoactive infusions 02/08/2017  4:00 PM  Exposed Catheter (cm) 1 cm 02/08/2017  4:00 PM  Dressing Change Due 02/15/17 02/08/2017  4:00 PM       Jule Economy Horton 02/08/2017, 4:41 PM

## 2017-02-08 NOTE — Telephone Encounter (Signed)
Patient called and stated that he has had a difficulty breathing all the time, sufficient swelling in legs and abdomen, pt stated that he has gained 15-20 LBS in the past two weeks. Instructed pt that ICM Clinic RN is out of the office today. Pt requested that I send report to the heart failure clinic because he can not wait another day. Gave pt heart failure clinic phone number informed him that I would send this note and also fax the [facility]lin report to the heart failure clinic. Patient verbalized understanding.

## 2017-02-08 NOTE — H&P (Signed)
Advanced Heart Failure Team History and Physical Note   Primary Physician:  Dr. Ola Spurr Primary Cardiologist:  Dr. Aundra Dubin   Reason for Admission: Acute on chronic systolic CHF.    HPI:   [name] is a 51 year old male with a past medical history of CAD s/p anterior MI in 2010 and recent staged PCI to distal LAD and proximal RCA in march 2018, ICM, chronic systolic CHF (EF 99-37%) in March 2018.   He was admitted in March 2018 with increased dyspnea and nausea. Symptoms were consistent with CHF exacerbation but troponin was elevated at 5.11. Diuresis was sluggish, and PICC line was placed. His initial Co ox was 36%, started on milrinone and norepi. He underwent right and left heart cath that showed severe proximal RCA stenosis and mid LAD stenosis, both treated with DES. He was weaned off milrinone and norepi. His discharge weight was 181 pounds  Admitted 12/05/16-12/06/16 with atypical chest pain, K was 6.1. His troponin was 0.11, he did not have a repeat ischemic evaluation, EKG was consistent with prior EKG's. He was given IV insulin and D10. Arlyce Harman was held at discharge. Discharge weight was 171 pounds.   He presented to the CHF clinic today with weight gain, SOB and orthopnea. Weight was 204 pounds, he was very fatigued, complaining of abdominal pain and bloating. Reds vest reading in office was 44%. Thoracic impedence below threshold indicating volume overload. Will place PICC line and get co ox, if low output he will need milrinone.    Review of Systems: [y] = yes, [ ]  = no   General: Weight gain Blue.Reese ]; Weight loss [ ] ; Anorexia [ ] ; Fatigue [ y]; Fever [ ] ; Chills [ ] ; Weakness [ ]   Cardiac: Chest pain/pressure [ ] ; Resting SOB Blue.Reese ]; Exertional SOB Blue.Reese ]; Orthopnea Blue.Reese ]; Pedal Edema [ ] ; Palpitations [ ] ; Syncope [ ] ; Presyncope [ ] ; Paroxysmal nocturnal dyspnea[ ]   Pulmonary: Cough [ ] ; Wheezing[ ] ; Hemoptysis[ ] ; Sputum [ ] ; Snoring [ ]   GI: Vomiting[ ] ; Dysphagia[ ] ; Melena[ ] ;  Hematochezia [ ] ; Heartburn[ ] ; Abdominal pain [ ] ; Constipation [ ] ; Diarrhea [ ] ; BRBPR [ ]   GU: Hematuria[ ] ; Dysuria [ ] ; Nocturia[ ]   Vascular: Pain in legs with walking [ ] ; Pain in feet with lying flat [ ] ; Non-healing sores [ ] ; Stroke [ ] ; TIA [ ] ; Slurred speech [ ] ;  Neuro: Headaches[ ] ; Vertigo[ ] ; Seizures[ ] ; Paresthesias[ ] ;Blurred vision [ ] ; Diplopia [ ] ; Vision changes [ ]   Ortho/Skin: Arthritis [ y]; Joint pain [ ] ; Muscle pain [ ] ; Joint swelling [ ] ; Back Pain [ ] ; Rash [ ]   Psych: Depression[ ] ; Anxiety[ ]   Heme: Bleeding problems [ ] ; Clotting disorders [ ] ; Anemia [ ]   Endocrine: Diabetes [ ] ; Thyroid dysfunction[ ]    Home Medications Prior to Admission medications   Medication Sig Start Date End Date Taking? Authorizing Provider  atorvastatin (LIPITOR) 80 MG tablet Take 1 tablet (80 mg total) by mouth daily at 6 PM. 11/28/16   Tillery, Satira Mccallum, PA-C  carvedilol (COREG) 6.25 MG tablet Take 1 tablet (6.25 mg total) by mouth 2 (two) times daily. 12/27/16   Deboraha Sprang, MD  clopidogrel (PLAVIX) 75 MG tablet Take 1 tablet (75 mg total) by mouth daily. 11/29/16   Shirley Friar, PA-C  digoxin (LANOXIN) 0.125 MG tablet Take 1 tablet (0.125 mg total) by mouth daily. 12/13/16   Larey Dresser, MD  hydrALAZINE (APRESOLINE) 50 MG tablet Take 1.5 tablets (75 mg total) by mouth 3 (three) times daily. 12/13/16   Larey Dresser, MD  insulin glargine (LANTUS) 100 UNIT/ML injection Inject 35 Units into the skin at bedtime.     [provider]  isosorbide mononitrate (IMDUR) 60 MG 24 hr tablet Take 1.5 tablets (90 mg total) by mouth daily. 12/13/16   Larey Dresser, MD  metoCLOPramide (REGLAN) 5 MG tablet Take one tablet 1/2 hr before meals. 01/10/17   Esterwood, Amy S, PA-C  nitroGLYCERIN (NITROSTAT) 0.4 MG SL tablet Place 1 tablet (0.4 mg total) under the tongue every 5 (five) minutes as needed for chest pain. 07/13/16   Jerline Pain, MD  ondansetron (ZOFRAN  ODT) 4 MG disintegrating tablet Take 1 tablet (4 mg total) by mouth every 6 (six) hours as needed for nausea or vomiting. 01/10/17   Esterwood, Amy S, PA-C  ranolazine (RANEXA) 500 MG 12 hr tablet Take 1 tablet (500 mg total) by mouth 2 (two) times daily. 12/14/16   Arbutus Leas, NP  torsemide (DEMADEX) 20 MG tablet Take 3 tablets (60 mg total) by mouth daily. 12/28/16   Larey Dresser, MD  warfarin (COUMADIN) 5 MG tablet Take as directed by coumadin clinic 01/10/17   Deboraha Sprang, MD    Past Medical History: Past Medical History:  Diagnosis Date  . Angina   . ASCVD (arteriosclerotic cardiovascular disease)    , Anterior infarction 2005, LAD diagonal bifurcation intervention 03/2004  . Automatic implantable cardiac defibrillator -St. Jude's       . Benign neoplasm of colon   . Chronic systolic heart failure ([place])   . Coronary artery disease     Widely patent previously placed stents in the left anterior   . Crohn's disease ([place])   . 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 Gastroparesis   . GERD (gastroesophageal reflux disease)   . High cholesterol   . Hyperlipidemia   . Hypersomnolent    Previous diagnosis of narcolepsy  . Hypertension, essential   . Ischemic cardiomyopathy    Ejection fraction 15-20% catheterization 2010  . Type II or unspecified type diabetes mellitus without mention of complication, not stated as uncontrolled   . Unspecified gastritis and gastroduodenitis without mention of hemorrhage     Past Surgical History: Past Surgical History:  Procedure Laterality Date  . APPENDECTOMY    . CARDIAC DEFIBRILLATOR PLACEMENT  2010   St. Jude ICD  . COLECTOMY     "for Crohn's"  . COLON SURGERY    . CORONARY STENT INTERVENTION N/A 11/25/2016   Procedure: Coronary Stent Intervention;  Surgeon: Leonie Man, MD;  Location: [facility];  Service: Cardiovascular;  Laterality: N/A;  . EP IMPLANTABLE DEVICE N/A 09/14/2016   Procedure: ICD  Generator Changeout;  Surgeon: Deboraha Sprang, MD;  Location: [facility];  Service: Cardiovascular;  Laterality: N/A;  . FETAL SURGERY FOR CONGENITAL HERNIA    . INGUINAL HERNIA REPAIR    . RIGHT/LEFT HEART CATH AND CORONARY ANGIOGRAPHY N/A 11/23/2016   Procedure: Right/Left Heart Cath and Coronary Angiography;  Surgeon: Larey Dresser, MD;  Location: [facility];  Service: Cardiovascular;  Laterality: N/A;     Family History  Problem Relation Age of Onset  . Colon polyps Mother   . Diabetes Mother   . Heart attack Father   . Diabetes Father   . Stroke Paternal Grandfather   .  Colitis Sister   . Heart disease Brother   . Colitis Sister   . Colon cancer Neg Hx     Social History: Social History   Social History  . Marital status: Married    Spouse name: N/A  . Number of children: N/A  . Years of education: N/A   Social History Main Topics  . Smoking status: Never Smoker  . Smokeless tobacco: Never Used  . Alcohol use No  . Drug use: No  . Sexual activity: No   Other Topics Concern  . Not on file   Social History Narrative  . No narrative on file    Allergies:  Allergies  Allergen Reactions  . Metformin And Related Diarrhea    Objective:    Physical Exam: General:  Fatigued appearing male, SOB at rest.  HEENT: normal Neck: supple. JVP to jaw. Carotids 2+ bilat; no bruits. No lymphadenopathy or thyromegaly appreciated. Cor: PMI nondisplaced. Regular rate & rhythm. No murmur.  S3 noted.  Lungs: Clear in upper lobes, bilateral crackles in bases.  Abdomen: soft, tender, distended. No hepatosplenomegaly. No bruits or masses. Good bowel sounds. Extremities: no cyanosis, clubbing, rash. Trace bilateral pedal edema.  Neuro: alert & oriented x 3, cranial nerves grossly intact. moves all 4 extremities w/o difficulty. Affect pleasant.  CBC:  Recent Labs Lab 02/08/17 1250  WBC 9.0  HGB 10.3*  HCT 31.7*  MCV 78.9  PLT 239     BNP: BNP (last  3 results)  Recent Labs  12/21/16 1042 01/03/17 1039 01/17/17 1204  BNP 648.9* 520.2* 353.6*      CBG:  Recent Labs Lab 02/03/17 1128 02/06/17 1116  GLUCAP 129* 153*   Transthoracic Echocardiography 11/2016 Study Conclusions  - Left ventricle: The cavity size was normal. Wall thickness was   normal. Systolic function was severely reduced. The estimated   ejection fraction was in the range of 25% to 30%. Severe   hypokinesis of the mid-apicalanteroseptal, inferolateral, and   apical myocardium. Features are consistent with a pseudonormal   left ventricular filling pattern, with concomitant abnormal   relaxation and increased filling pressure (grade 2 diastolic   dysfunction). - Mitral valve: Valve area by continuity equation (using LVOT   flow): 1.49 cm^2. - Right atrium: The atrium was mildly dilated. - Tricuspid valve: There was mild-moderate regurgitation. - Pulmonary arteries: Systolic pressure was moderately increased.   PA peak pressure: 55 mm Hg (S).    Assessment/Plan  1. Chronic systolic CHF: History of ischemic cardiomyopathy, recent stent placement. Echo 11/2016 EF 25-30%.  - NYHA class III. BNP 1330 - Start IV lasix 59m BID. - Place PICC and draw Co ox when placed and confirmed - Set up CVP - He required milrinone and norepi in the past for low output.    - Continue hydralazine 75 mg tid and Imdur 90 daily.  - No ARB/ACEI with recent AKI and suspect creatinine still above baseline, no spironolactone with recent hyperkalemia.  - Reduce Coreg to 3.125 mg with suspected low output.  2. CKD: Stage 3.   - Follow with daily BMET.  - creatinine 1.22 today 3. CAD: NSTEMI in 3/18. LHC with 99% ulcerated lesion proximal RCA with left to right collaterals, 95% mid LAD stenosis after mid LAD stent. s/p PCI to RCA and LAD on 11/25/16. - Continue Plavix.  - No ASA with Plavix and coumadin - Continue high dose statin.  4. h/o DVTs:  - Warfarin per  pharmacy 5. Chronic N/V/D with  gastroparesis with exacerbation. - Continue Reglan before meals.   Length of Stay: [place], NP 02/08/2017, 1:05 PM  Advanced Heart Failure Team Pager [phone] (M-F; 7a - 4p)  Please contact [facility] for night-coverage after hours (4p -7a ) and weekends on [email or web address]  Patient seen with NP, agree with the above note.  Patient is markedly volume overloaded on exam, weight up, has ongoing nausea.  1. Acute on chronic systolic CHF: Ischemic cardiomyopathy.  Has St Jude ICD.  Last echo in 3/18 with EF 25-30%. On exam, today, he is volume overloaded with NYHA class IIIb symptoms.  He has an S3. Co-ox is 80%, so may be more volume overload than low output.  I do question his medication compliance with much lower creatinine than his baseline, undetectable digoxin level, and low INR.  - Follow CVP and co-ox off PICC.  - Continue hydralazine/imdur at home doses.  - Lasix 80 mg IV bid for now.  - Decrease Coreg to 3.125 mg bid.  - Continue digoxin.   - May be able to start ARB/spironolactone in the near future if creatinine remains low.  2. CAD: NSTEMI 3/18 with PCI RCA and LAD.   - Continue Plavix and statin.  - No ASA with warfarin use.  - Can continue ranolazine.  3. H/o DVTs: Continue warfarin.  ?compliance with low INR.  4. CKD: Stage 3.  Creatinine is below his usual baseline.  5. Gastroparesis: Prominent nausea.  May be combination of gastroparesis and CHF.   [name] 02/08/2017

## 2017-02-08 NOTE — Telephone Encounter (Signed)
Patient called in and stated he has been having SOB for the last 36 hours, swelling in his lower extremities and abdomen, and 15 lb weight gain in the last 2 weeks.  I asked him if he had any relief when we increased his torsemide last week and he said no.  I have added him on to the NP schedule for today at 11:30 per Jettie Booze, NP. Patient is aware.

## 2017-02-08 NOTE — Progress Notes (Signed)
PCP: Dr. Ola Spurr HF Cardiology: Dr. Aundra Dubin  51 yo with history of CAD s/p anterior MI in 2010 and chronic systolic CHF (ischemic cardiomyopathy) was admitted in 3/18 with 10 days of increased dyspnea and nausea.  Patient had been stable until that time, taking Lasix 80 mg po bid. Noted that weight was rising and he was developing lower extremity edema that was spreading up to his abdomen.  He tried increasing Lasix to 80 mg tid but this did not help.  He had orthopnea.  No chest pain.  Symptoms and presentation consistent with CHF, but troponin found to be elevated at 5.11.  UOP initially poor with rise in creatinine to 2.6. He was found to have low output with co-ox 36% and was started on milrinone gtt with diuresis.  Volume status and creatinine gradually improved.  He eventually underwent cath with severe proximal RCA and mid LAD stenoses noted, these were treated with DES.   He was readmitted shortly after discharge with chest pain.  This was not thought to be ACS but he was found to be in AKI again with hyperkalemia.  Digoxin and spironolactone were stopped.  Torsemide was stopped for a couple of days.   He presents today as an add on visit for SOB and weight gain. His weight at home is up to 201 pounds, weight in clinic today 204 pounds. His discharge weight in April 2018 was 171 pounds. He is SOB with walking into clinic, feels very fatigued. Endorses orthopnea. He denies chest pain and palpitations.   Labs (4/18): K 5.7 => 4.7, creatinine 1.67 => 1.95, hgb 12.8, BNP 383 Labs (5/18): K 4.8, creatinine 2.05.   Corevue: thoracic impedance below threshold. 3 episodes of SVT.   PMH: 1. Chronic systolic CHF: Ischemic cardiomyopathy.  St Jude ICD.  - Echo (11/17) with EF 25-30%, moderate diastolic dysfunction, mild MR, mildly dilated RV with moderately decreased systolic function, peak RV-RA gradient 48 mmHg.  - Echo (3/18) with EF 25-30%, wall motion abnormalities, normal RV size and systolic  function.  - Admission 3/18 with cardiogenic shock and AKI, required milrinone gtt.  2. CAD: Anterior MI 2005, had bifurcation stenting LAD/diagonal.  Repeat cath 2010 with patent stents.  - NSTEMI 3/18: LHC with 99% ulcerated lesion proximal RCA, 95% mid LAD => PCI with DES to proximal RCA and mid LAD.  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  4. Crohns disease: History of colectomy.  5. GERD 6. Gastroparesis 7. HTN 8. Hyperlipidemia 9. Type II diabetes   Social History   Social History  . Marital status: Married    Spouse name: N/A  . Number of children: N/A  . Years of education: N/A   Occupational History  . Not on file.   Social History Main Topics  . Smoking status: Never Smoker  . Smokeless tobacco: Never Used  . Alcohol use No  . Drug use: No  . Sexual activity: No   Other Topics Concern  . Not on file   Social History Narrative  . No narrative on file   Family History  Problem Relation Age of Onset  . Colon polyps Mother   . Diabetes Mother   . Heart attack Father   . Diabetes Father   . Stroke Paternal Grandfather   . Colitis Sister   . Heart disease Brother   . Colitis Sister   . Colon cancer Neg Hx    Review of systems complete and found to be negative unless listed in HPI.  Current Outpatient Prescriptions  Medication Sig Dispense Refill  . atorvastatin (LIPITOR) 80 MG tablet Take 1 tablet (80 mg total) by mouth daily at 6 PM. 30 tablet 6  . carvedilol (COREG) 6.25 MG tablet Take 1 tablet (6.25 mg total) by mouth 2 (two) times daily. 60 tablet 6  . clopidogrel (PLAVIX) 75 MG tablet Take 1 tablet (75 mg total) by mouth daily. 30 tablet 6  . digoxin (LANOXIN) 0.125 MG tablet Take 1 tablet (0.125 mg total) by mouth daily. 90 tablet 3  . hydrALAZINE (APRESOLINE) 50 MG tablet Take 1.5 tablets (75 mg total) by mouth 3 (three) times daily. 135 tablet 6  . insulin glargine (LANTUS) 100 UNIT/ML injection Inject 35 Units into the skin at bedtime.     .  isosorbide mononitrate (IMDUR) 60 MG 24 hr tablet Take 1.5 tablets (90 mg total) by mouth daily. 45 tablet 6  . metoCLOPramide (REGLAN) 5 MG tablet Take one tablet 1/2 hr before meals. 90 tablet 2  . nitroGLYCERIN (NITROSTAT) 0.4 MG SL tablet Place 1 tablet (0.4 mg total) under the tongue every 5 (five) minutes as needed for chest pain. 25 tablet 3  . ondansetron (ZOFRAN ODT) 4 MG disintegrating tablet Take 1 tablet (4 mg total) by mouth every 6 (six) hours as needed for nausea or vomiting. 40 tablet 1  . ranolazine (RANEXA) 500 MG 12 hr tablet Take 1 tablet (500 mg total) by mouth 2 (two) times daily. 60 tablet 3  . torsemide (DEMADEX) 20 MG tablet Take 3 tablets (60 mg total) by mouth daily. 90 tablet 3  . warfarin (COUMADIN) 5 MG tablet Take as directed by coumadin clinic 50 tablet 1   No current facility-administered medications for this encounter.    BP (!) 178/86 (BP Location: Left Arm, Patient Position: Sitting, Cuff Size: Normal)   Pulse 95   Wt 204 lb 6.4 oz (92.7 kg)   SpO2 98%   BMI 31.08 kg/m    Wt Readings from Last 3 Encounters:  02/08/17 204 lb 6.4 oz (92.7 kg)  01/17/17 190 lb 12.8 oz (86.5 kg)  01/17/17 189 lb 2.5 oz (85.8 kg)    General: Fatigued appearing male, SOB with walking into clinic.  HEENT: normal Neck: supple. JVD to jaw.  Carotids 2+ bilat; no bruits. No thyromegaly or nodule noted. Cor: PMI nondisplaced. Regular rate and rhythm. No M/G/R noted Lungs: Clear in upper lobes, crackles in bilateral bases.  Abdomen: soft, tender, distended. no HSM. No bruits or masses. +BS  Extremities: no cyanosis, clubbing, or rash. Trace pedal edema. Cool.  Neuro: alert & orientedx3, cranial nerves grossly intact. moves all 4 extremities w/o difficulty. Affect flat.   Assessment/Plan: 1. Chronic systolic CHF: History of ischemic cardiomyopathy with 11/17 echo showing EF 25-30%.  He was admitted in 3/18 with cardiogenic shock and AKI in the setting of NSTEMI.  Echo showed  stably low EF, 25-30%.  He had PCI to LAD and RCA.  He required milrinone and diuresis, milrinone was weaned off.  - NYHA class III.  - Volume status elevated on exam. Reds vest 44%.   - Dr. Aundra Dubin saw, recommends hospital admission for PICC line placement, IV diuresis.  - Will order IV Lasix 68m BID.   - Continue hydralazine 75 mg tid and Imdur 90 daily.  - No ARB/ACEI with recent AKI and suspect creatinine still above baseline, no spironolactone with recent hyperkalemia.   - Reduce Coreg to 3.125 mg with suspected low output.  2.  CKD: Stage 3.   - CMET today.  3. CAD: NSTEMI in 3/18. LHC with 99% ulcerated lesion proximal RCA with left to right collaterals, 95% mid LAD stenosis after mid LAD stent.  s/p PCI to RCA and LAD on 11/25/16.  Chest pain mostly resolved with use of ranolazine.  - On Plavix, he has been taking this.   - Continue high dose statin.  - Seen for initial cardiac rehab visit today.   - Given use of clopidogrel and risk for interaction,  - Continue protonix 40 mg daily.  - No ASA with plavix + warfarin.  4. h/o DVTs: Continue warfarin. Denies bleeding. - no change to current plan.  5. Chronic N/V/D with gastroparesis with exacerbation. - Seen by GI now. On gastroparesis meds and diet which seems to have helped. Appreciated their input.  - Continue current management.   He is 30 pounds up from March 2018, will admit for IV diuresis. Place PICC for suspected low output, he required milrinone in the past.   Arbutus Leas, NP 02/08/2017

## 2017-02-08 NOTE — Progress Notes (Signed)
ANTICOAGULATION CONSULT NOTE - Initial Consult  Pharmacy Consult for Coumadin Indication: hx recurrent DVTs  Allergies  Allergen Reactions  . Metformin And Related Diarrhea    Patient Measurements: Height: 5' 8"  (172.7 cm) Weight: 202 lb (91.6 kg) IBW/kg (Calculated) : 68.4   Vital Signs: Temp: 98.1 F (36.7 C) (06/06 1428) Temp Source: Axillary (06/06 1428) BP: 178/86 (06/06 1211) Pulse Rate: 85 (06/06 1428)  Labs:  Recent Labs  02/08/17 1230 02/08/17 1250  HGB  --  10.3*  HCT  --  31.7*  PLT  --  239  LABPROT 14.3  --   INR 1.10  --   CREATININE 1.22  --     Estimated Creatinine Clearance: 77.8 mL/min (by C-G formula based on SCr of 1.22 mg/dL).   Medical History: Past Medical History:  Diagnosis Date  . Angina   . ASCVD (arteriosclerotic cardiovascular disease)    , Anterior infarction 2005, LAD diagonal bifurcation intervention 03/2004  . Automatic implantable cardiac defibrillator -St. Jude's       . Benign neoplasm of colon   . Chronic systolic heart failure ([place])   . Coronary artery disease     Widely patent previously placed stents in the left anterior   . Crohn's disease ([place])   . 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 Gastroparesis   . GERD (gastroesophageal reflux disease)   . High cholesterol   . Hyperlipidemia   . Hypersomnolent    Previous diagnosis of narcolepsy  . Hypertension, essential   . Ischemic cardiomyopathy    Ejection fraction 15-20% catheterization 2010  . Type II or unspecified type diabetes mellitus without mention of complication, not stated as uncontrolled   . Unspecified gastritis and gastroduodenitis without mention of hemorrhage    Assessment: 51yom on coumadin pta for hx recurrent DVTs. INR 1.1 on admission so patient likely not taking.  Home dose (per last [facility] clinic visit 5/22): 162m daily except 7.561mTue/Sat  Goal of Therapy:  INR 2-3 Monitor platelets by anticoagulation protocol: Yes    Plan:  1) Coumadin 7.62m57monight 2) Daily INR 3) May need to add heparin or lovenox bridge  MarDeboraha Sprang6/2018,2:53 PM

## 2017-02-08 NOTE — Progress Notes (Signed)
    ReDS Vest - 02/08/17 1200      ReDS Vest   MR  Moderate   Fitting Posture Standing   Height Marker Tall   Ruler Value 10.5   Center Strip Aligned   ReDS Value 44

## 2017-02-08 NOTE — Telephone Encounter (Signed)
Pt seen in HF clinic and admitted to hospital today

## 2017-02-09 ENCOUNTER — Inpatient Hospital Stay (HOSPITAL_COMMUNITY): Payer: BLUE CROSS/BLUE SHIELD

## 2017-02-09 LAB — BASIC METABOLIC PANEL
ANION GAP: 7 (ref 5–15)
BUN: 20 mg/dL (ref 6–20)
CO2: 22 mmol/L (ref 22–32)
Calcium: 8 mg/dL — ABNORMAL LOW (ref 8.9–10.3)
Chloride: 109 mmol/L (ref 101–111)
Creatinine, Ser: 1.29 mg/dL — ABNORMAL HIGH (ref 0.61–1.24)
GFR calc non Af Amer: >60 mL/min (ref >60)
GLUCOSE: 71 mg/dL (ref 65–99)
Potassium: 3 mmol/L — ABNORMAL LOW (ref 3.5–5.1)
Sodium: 138 mmol/L (ref 135–145)

## 2017-02-09 LAB — PROTIME-INR
INR: 1.15
Prothrombin Time: 14.8 seconds (ref 11.4–15.2)

## 2017-02-09 LAB — COOXEMETRY PANEL
CARBOXYHEMOGLOBIN: 1.2 % (ref 0.5–1.5)
METHEMOGLOBIN: 1.6 % — AB (ref 0.0–1.5)
O2 Saturation: 73.6 %
TOTAL HEMOGLOBIN: 8.3 g/dL — AB (ref 12.0–16.0)

## 2017-02-09 LAB — GLUCOSE, CAPILLARY
GLUCOSE-CAPILLARY: 96 mg/dL (ref 65–99)
GLUCOSE-CAPILLARY: 118 mg/dL — AB (ref 65–99)
GLUCOSE-CAPILLARY: 126 mg/dL — AB (ref 65–99)
Glucose-Capillary: 65 mg/dL (ref 65–99)
Glucose-Capillary: 108 mg/dL — ABNORMAL HIGH (ref 65–99)

## 2017-02-09 LAB — MAGNESIUM: MAGNESIUM: 2 mg/dL (ref 1.7–2.4)

## 2017-02-09 MED ORDER — WARFARIN SODIUM 7.5 MG PO TABS
7.5000 mg | ORAL_TABLET | Freq: Once | ORAL | Status: AC
  Administered 2017-02-09: 7.5 mg via ORAL
  Filled 2017-02-09: qty 1

## 2017-02-09 MED ORDER — METOLAZONE 5 MG PO TABS
5.0000 mg | ORAL_TABLET | Freq: Once | ORAL | Status: AC
  Administered 2017-02-09: 5 mg via ORAL
  Filled 2017-02-09: qty 1

## 2017-02-09 MED ORDER — FUROSEMIDE 10 MG/ML IJ SOLN
80.0000 mg | Freq: Three times a day (TID) | INTRAMUSCULAR | Status: DC
Start: 2017-02-09 — End: 2017-02-10
  Administered 2017-02-09 (×3): 80 mg via INTRAVENOUS
  Filled 2017-02-09 (×3): qty 8

## 2017-02-09 MED ORDER — POTASSIUM CHLORIDE CRYS ER 20 MEQ PO TBCR
40.0000 meq | EXTENDED_RELEASE_TABLET | Freq: Two times a day (BID) | ORAL | Status: DC
  Administered 2017-02-09 – 2017-02-13 (×9): 40 meq via ORAL
  Filled 2017-02-09 (×9): qty 2

## 2017-02-09 MED ORDER — INSULIN GLARGINE 100 UNIT/ML ~~LOC~~ SOLN
28.0000 [IU] | Freq: Every day | SUBCUTANEOUS | Status: DC
  Administered 2017-02-10 – 2017-02-12 (×3): 28 [IU] via SUBCUTANEOUS
  Filled 2017-02-09 (×3): qty 0.28

## 2017-02-09 NOTE — Progress Notes (Signed)
Inpatient Diabetes Program Recommendations  AACE/ADA: New Consensus Statement on Inpatient Glycemic Control (2015)  Target Ranges:  Prepandial:   less than 140 mg/dL      Peak postprandial:   less than 180 mg/dL (1-2 hours)      Critically ill patients:  140 - 180 mg/dL   Review of Glycemic Control  Diabetes history: DM 2 Outpatient Diabetes medications: Lantus 35 units Current orders for Inpatient glycemic control: Lantus 35 units, Novolog Sensitive tid  Inpatient Diabetes Program Recommendations:    Mild hypoglycemia this am, 65 mg/dl on home dose of Lantus. Consider decreasing Lantus to 28 units.  A1c in process  Thanks,  Tama Headings RN, MSN, [facility] Inpatient Diabetes Coordinator Team Pager [phone] (8a-5p)

## 2017-02-09 NOTE — Progress Notes (Signed)
Advanced Heart Failure Rounding Note  PCP:  Dr. Ola Spurr Primary Cardiologist: Dr. Aundra Dubin   Subjective:    Admitted from [place] clinic on 02/08/17 with volume overload. Co ox 73%. CVP 15. Urine output sluggish, -655m so far.   Weight up one pound from yesterday. Continues to feel poorly, but says that his breathing has improved slightly. Denies chest pain and palpitations.   Objective:   Weight Range: 203 lb 11.3 oz (92.4 kg) Body mass index is 30.97 kg/m.   Vital Signs:   Temp:  [97.6 F (36.4 C)-98.2 F (36.8 C)] 98.2 F (36.8 C) (06/07 0402) Pulse Rate:  [81-88] 87 (06/07 0402) Resp:  [19-32] 20 (06/07 0402) BP: (100-159)/(52-85) 100/52 (06/07 0402) SpO2:  [98 %-100 %] 98 % (06/07 0402) Weight:  [202 lb (91.6 kg)-203 lb 11.3 oz (92.4 kg)] 203 lb 11.3 oz (92.4 kg) (06/07 0402) Last BM Date: 02/08/17  Weight change: Filed Weights   02/08/17 1428 02/09/17 0402  Weight: 202 lb (91.6 kg) 203 lb 11.3 oz (92.4 kg)    Intake/Output:   Intake/Output Summary (Last 24 hours) at 02/09/17 0721 Last data filed at 02/08/17 2100  Gross per 24 hour  Intake              100 ml  Output              751 ml  Net             -651 ml     Physical Exam: General: Male, NAD. Lying in bed.  HEENT: normal Neck: supple. JVP to jaw . Carotids 2+ bilat; no bruits. No lymphadenopathy or thyromegaly appreciated. Cor: PMI nondisplaced. Regular rate & rhythm. No rubs, gallops or murmurs. Lungs: clear in upper lobes bilaterally. Fine crackles in bilateral bases.  Abdomen: soft, nontender, + distended. No hepatosplenomegaly. No bruits or masses. Good bowel sounds. Extremities: no cyanosis, clubbing, rash, edema Neuro: alert & orientedx3, cranial nerves grossly intact. moves all 4 extremities w/o difficulty. Affect pleasant   Telemetry: NSR  Labs: CBC  Recent Labs  02/08/17 1250  WBC 9.0  HGB 10.3*  HCT 31.7*  MCV 78.9  PLT 2127  Basic Metabolic Panel  Recent Labs   02/08/17 1230 02/09/17 0440  NA 141 138  K 3.5 3.0*  CL 113* 109  CO2 22 22  GLUCOSE 63* 71  BUN 17 20  CREATININE 1.22 1.29*  CALCIUM 8.3* 8.0*  MG 1.3* 2.0   Liver Function Tests  Recent Labs  02/08/17 1230  AST 33  ALT 35  ALKPHOS 137*  BILITOT 0.9  PROT 6.9  ALBUMIN 3.1*   No results for input(s): LIPASE, AMYLASE in the last 72 hours. Cardiac Enzymes No results for input(s): CKTOTAL, CKMB, CKMBINDEX, TROPONINI in the last 72 hours.  BNP: BNP (last 3 results)  Recent Labs  01/03/17 1039 01/17/17 1204 02/08/17 1250  BNP 520.2* 353.6* 1,330.6*      Medications:     Scheduled Medications: . atorvastatin  80 mg Oral q1800  . carvedilol  3.125 mg Oral BID WC  . clopidogrel  75 mg Oral Daily  . digoxin  0.125 mg Oral Daily  . furosemide  80 mg Intravenous BID  . hydrALAZINE  75 mg Oral TID  . insulin aspart  0-9 Units Subcutaneous TID WC  . insulin glargine  35 Units Subcutaneous QHS  . isosorbide mononitrate  90 mg Oral Daily  . metoCLOPramide  2.5 mg Oral TID AC  .  potassium chloride  40 mEq Oral BID  . ranolazine  500 mg Oral BID  . sodium chloride flush  10-40 mL Intracatheter Q12H  . Warfarin - Pharmacist Dosing Inpatient   Does not apply q1800     Infusions:   PRN Medications:  acetaminophen, ALPRAZolam, nitroGLYCERIN, ondansetron (ZOFRAN) IV, sodium chloride flush   Assessment/Plan   1. Chronic systolic CHF: History of ischemic cardiomyopathy, recent stent placement. Echo 11/2016 EF 25-30%.  - NYHA class III, remains volume overloaded on exam.  - Give Metolazone 73m today.  - Increase lasix to 851mTID - Co ox stable.    - Continue hydralazine 75 mg tid and Imdur 90 daily.  - Continue Coreg 3.12561mID.  - Continue digoxin, dig level <0.2  2. CKD: Stage 3.  - Creatinine stable today. BUN 20.  3. CAD: NSTEMI in 3/18. LHC with 99% ulcerated lesion proximal RCA with left to right collaterals, 95% mid LAD stenosis after mid LAD  stent. s/p PCI to RCA and LAD on 11/25/16. - Continue Plavix.  - No ASA with Plavix and coumadin - Continue high dose statin.  - Continue current plan.  4. h/o DVTs:  - Warfarin per pharmacy -  INR 1.15 5. Chronic N/V/D with gastroparesis with exacerbation. - Continue Reglan before meals.  - No change to current plan.   Length of Stay: 1  [place]  02/09/2017, 7:21 AM  Advanced Heart Failure Team Pager [phone]-F; 7a - 4p)  Please contact [facility] for night-coverage after hours (4p -7a ) and weekends on [email or web address]  1. Acute on chronic systolic CHF: Ischemic cardiomyopathy.  Has St Jude ICD.  Last echo in 3/18 with EF 25-30%. Admitted with volume overload/weight gain.  He did not diurese particularly well overnight but creatinine stable.  Co-ox 74%, CVP 15. - Follow CVP and co-ox off PICC.  He does not appear to need milrinone at this point, co-ox does not suggest low output.  - Continue hydralazine/imdur at home doses.  - Increase Lasix to 80 mg IV every 8 hrs and add metolazone 5 mg po x 1 today.  Replace K.  - Decreased Coreg to 3.125 mg bid.  - Continue digoxin, level ok.   - May be able to start ARB/spironolactone in the near future if creatinine remains low.  2. CAD: NSTEMI 3/18 with PCI RCA and LAD.   - Continue Plavix and statin.  - No ASA with warfarin use.  - Can continue ranolazine, QT interval on ECG ok.   3. H/o DVTs: Continue warfarin.  ?compliance with low INR.  4. CKD: Stage 3.  Creatinine is below his usual baseline.  5. Gastroparesis: Prominent nausea.  May be combination of gastroparesis and CHF.   DalHjalmar Ballengee7/2018 8:01 AM

## 2017-02-09 NOTE — Progress Notes (Signed)
ANTICOAGULATION CONSULT NOTE - Follow Up Consult  Pharmacy Consult for Coumadin Indication: hx recurrent DVTs  Allergies  Allergen Reactions  . Metformin And Related Diarrhea    Patient Measurements: Height: 5' 8"  (172.7 cm) Weight: 203 lb 11.3 oz (92.4 kg) IBW/kg (Calculated) : 68.4   Vital Signs: Temp: 97.5 F (36.4 C) (06/07 0739) Temp Source: Oral (06/07 0739) BP: 135/72 (06/07 0739) Pulse Rate: 85 (06/07 0739)  Labs:  Recent Labs  02/08/17 1230 02/08/17 1250 02/09/17 0440  HGB  --  10.3*  --   HCT  --  31.7*  --   PLT  --  239  --   LABPROT 14.3  --  14.8  INR 1.10  --  1.15  CREATININE 1.22  --  1.29*    Estimated Creatinine Clearance: 73.9 mL/min (A) (by C-G formula based on SCr of 1.29 mg/dL (H)).  Assessment: 51yom continues on coumadin for hx recurrent DVTs. INR 1.1 on admission so patient likely not taking. INR 1.15 today. Spoke to Dr. Aundra Dubin about bridging and he does not want to bridge at this time.   Home dose (per last [facility] clinic visit 5/22): 16m daily except 7.552mTue/Sat  Goal of Therapy:  INR 2-3 Monitor platelets by anticoagulation protocol: Yes   Plan:  1) Coumadin 7.62m11mgain tonight 2) Daily INR   MarDeboraha Sprang7/2018,12:12 PM

## 2017-02-10 ENCOUNTER — Encounter (HOSPITAL_COMMUNITY): Admission: RE | Admit: 2017-02-10 | Payer: BLUE CROSS/BLUE SHIELD | Source: Ambulatory Visit

## 2017-02-10 LAB — BASIC METABOLIC PANEL
ANION GAP: 10 (ref 5–15)
BUN: 26 mg/dL — ABNORMAL HIGH (ref 6–20)
CHLORIDE: 104 mmol/L (ref 101–111)
CO2: 24 mmol/L (ref 22–32)
Calcium: 8.4 mg/dL — ABNORMAL LOW (ref 8.9–10.3)
Creatinine, Ser: 1.9 mg/dL — ABNORMAL HIGH (ref 0.61–1.24)
GFR, EST AFRICAN AMERICAN: 45 mL/min — AB (ref >60)
GFR, EST NON AFRICAN AMERICAN: 39 mL/min — AB (ref >60)
Glucose, Bld: 48 mg/dL — ABNORMAL LOW (ref 65–99)
POTASSIUM: 3.5 mmol/L (ref 3.5–5.1)
SODIUM: 138 mmol/L (ref 135–145)

## 2017-02-10 LAB — URINALYSIS, ROUTINE W REFLEX MICROSCOPIC
Bilirubin Urine: NEGATIVE
Glucose, UA: NEGATIVE mg/dL
HGB URINE DIPSTICK: NEGATIVE
Ketones, ur: NEGATIVE mg/dL
Leukocytes, UA: NEGATIVE
Nitrite: NEGATIVE
PROTEIN: NEGATIVE mg/dL
SPECIFIC GRAVITY, URINE: 1.005 (ref 1.005–1.030)
pH: 5 (ref 5.0–8.0)

## 2017-02-10 LAB — CBC
HCT: 30.7 % — ABNORMAL LOW (ref 39.0–52.0)
Hemoglobin: 10 g/dL — ABNORMAL LOW (ref 13.0–17.0)
MCH: 25 pg — AB (ref 26.0–34.0)
MCHC: 32.6 g/dL (ref 30.0–36.0)
MCV: 76.8 fL — AB (ref 78.0–100.0)
PLATELETS: 263 10*3/uL (ref 150–400)
RBC: 4 MIL/uL — AB (ref 4.22–5.81)
RDW: 19.4 % — ABNORMAL HIGH (ref 11.5–15.5)
WBC: 11.5 10*3/uL — ABNORMAL HIGH (ref 4.0–10.5)

## 2017-02-10 LAB — HEMOGLOBIN A1C
HEMOGLOBIN A1C: 7.5 % — AB (ref 4.8–5.6)
Mean Plasma Glucose: 169 mg/dL

## 2017-02-10 LAB — COOXEMETRY PANEL
CARBOXYHEMOGLOBIN: 0.9 % (ref 0.5–1.5)
Methemoglobin: 1.6 % — ABNORMAL HIGH (ref 0.0–1.5)
O2 SAT: 58.9 %
Total hemoglobin: 9.9 g/dL — ABNORMAL LOW (ref 12.0–16.0)

## 2017-02-10 LAB — GLUCOSE, CAPILLARY
GLUCOSE-CAPILLARY: 128 mg/dL — AB (ref 65–99)
Glucose-Capillary: 125 mg/dL — ABNORMAL HIGH (ref 65–99)
Glucose-Capillary: 92 mg/dL (ref 65–99)
Glucose-Capillary: 140 mg/dL — ABNORMAL HIGH (ref 65–99)

## 2017-02-10 LAB — PROTIME-INR
INR: 1.19
PROTHROMBIN TIME: 15.2 s (ref 11.4–15.2)

## 2017-02-10 MED ORDER — HYDRALAZINE HCL 25 MG PO TABS
25.0000 mg | ORAL_TABLET | Freq: Three times a day (TID) | ORAL | Status: DC
  Administered 2017-02-10 – 2017-02-13 (×9): 25 mg via ORAL
  Filled 2017-02-10 (×9): qty 1

## 2017-02-10 MED ORDER — WARFARIN SODIUM 7.5 MG PO TABS
7.5000 mg | ORAL_TABLET | Freq: Once | ORAL | Status: AC
  Administered 2017-02-10: 7.5 mg via ORAL
  Filled 2017-02-10: qty 1

## 2017-02-10 MED ORDER — VANCOMYCIN HCL 10 G IV SOLR
1500.0000 mg | Freq: Once | INTRAVENOUS | Status: AC
  Administered 2017-02-10: 1500 mg via INTRAVENOUS
  Filled 2017-02-10: qty 1500

## 2017-02-10 MED ORDER — PIPERACILLIN-TAZOBACTAM 3.375 G IVPB
3.3750 g | Freq: Three times a day (TID) | INTRAVENOUS | Status: DC
  Administered 2017-02-10 – 2017-02-13 (×9): 3.375 g via INTRAVENOUS
  Filled 2017-02-10 (×11): qty 50

## 2017-02-10 MED ORDER — VANCOMYCIN HCL 10 G IV SOLR
1500.0000 mg | INTRAVENOUS | Status: DC
  Administered 2017-02-11 – 2017-02-12 (×2): 1500 mg via INTRAVENOUS
  Filled 2017-02-10 (×4): qty 1500

## 2017-02-10 MED ORDER — FUROSEMIDE 10 MG/ML IJ SOLN
80.0000 mg | Freq: Two times a day (BID) | INTRAMUSCULAR | Status: DC
  Administered 2017-02-10 – 2017-02-11 (×3): 80 mg via INTRAVENOUS
  Filled 2017-02-10 (×3): qty 8

## 2017-02-10 NOTE — Progress Notes (Signed)
Advanced Heart Failure Rounding Note  PCP:  Dr. Ola Spurr Primary Cardiologist: Dr. Aundra Dubin   Subjective:    Admitted from [place] clinic on 02/08/17 with volume overload. Co ox 59% today. CVP 9-10. Good diuresis yesterday with metolazone + IV lasix TID.   Weight down 10 pounds. Creatinine 1.22->1.29->1.90.   Feels ok today, denies orthopnea, PND and chest pain. He had chills and fatigue yesterday. His temp was 100.5, WBC 9.0->11.5.   Objective:   Weight Range: 193 lb 9 oz (87.8 kg) Body mass index is 29.43 kg/m.   Vital Signs:   Temp:  [97.5 F (36.4 C)-100.5 F (38.1 C)] 98.3 F (36.8 C) (06/08 0400) Pulse Rate:  [85-103] 103 (06/07 1623) Resp:  [21-27] 27 (06/08 0400) BP: (101-135)/(50-74) 101/50 (06/08 0400) SpO2:  [97 %-99 %] 97 % (06/07 2024) Weight:  [193 lb 9 oz (87.8 kg)] 193 lb 9 oz (87.8 kg) (06/08 0515) Last BM Date: 02/08/17  Weight change: Filed Weights   02/08/17 1428 02/09/17 0402 02/10/17 0515  Weight: 202 lb (91.6 kg) 203 lb 11.3 oz (92.4 kg) 193 lb 9 oz (87.8 kg)    Intake/Output:   Intake/Output Summary (Last 24 hours) at 02/10/17 0703 Last data filed at 02/10/17 0515  Gross per 24 hour  Intake              840 ml  Output             3450 ml  Net            -2610 ml     Physical Exam: General: Male, NAD. Lying in bed.  HEENT: Normal, atraumatic.  Neck: supple. JVP 8-9 cm.  Carotids 2+ bilat; no bruits. No lymphadenopathy or thyromegaly appreciated. Cor: PMI nondisplaced. Regular rate and rhythm. No rubs, gallops or murmurs. Lungs: Clear in upper lobes, clear to diminished in bilateral bases.   Abdomen: soft, nontender, distention improved. No hepatosplenomegaly. No bruits or masses. Good bowel sounds. Extremities: no cyanosis, clubbing, rash. No peripheral edema.  Neuro: alert & orientedx3, cranial nerves grossly intact. moves all 4 extremities w/o difficulty. Affect pleasant   Telemetry: NSR   Labs: CBC  Recent Labs  02/08/17 1250  02/10/17 0512  WBC 9.0 11.5*  HGB 10.3* 10.0*  HCT 31.7* 30.7*  MCV 78.9 76.8*  PLT 239 657   Basic Metabolic Panel  Recent Labs  02/08/17 1230 02/09/17 0440 02/10/17 0512  NA 141 138 138  K 3.5 3.0* 3.5  CL 113* 109 104  CO2 22 22 24   GLUCOSE 63* 71 48*  BUN 17 20 26*  CREATININE 1.22 1.29* 1.90*  CALCIUM 8.3* 8.0* 8.4*  MG 1.3* 2.0  --    Liver Function Tests  Recent Labs  02/08/17 1230  AST 33  ALT 35  ALKPHOS 137*  BILITOT 0.9  PROT 6.9  ALBUMIN 3.1*   No results for input(s): LIPASE, AMYLASE in the last 72 hours. Cardiac Enzymes No results for input(s): CKTOTAL, CKMB, CKMBINDEX, TROPONINI in the last 72 hours.  BNP: BNP (last 3 results)  Recent Labs  01/03/17 1039 01/17/17 1204 02/08/17 1250  BNP 520.2* 353.6* 1,330.6*      Medications:     Scheduled Medications: . atorvastatin  80 mg Oral q1800  . carvedilol  3.125 mg Oral BID WC  . clopidogrel  75 mg Oral Daily  . digoxin  0.125 mg Oral Daily  . furosemide  80 mg Intravenous TID  . hydrALAZINE  75 mg  Oral TID  . insulin aspart  0-9 Units Subcutaneous TID WC  . insulin glargine  28 Units Subcutaneous QHS  . isosorbide mononitrate  90 mg Oral Daily  . metoCLOPramide  2.5 mg Oral TID AC  . potassium chloride  40 mEq Oral BID  . ranolazine  500 mg Oral BID  . sodium chloride flush  10-40 mL Intracatheter Q12H  . Warfarin - Pharmacist Dosing Inpatient   Does not apply q1800    Infusions:   PRN Medications: acetaminophen, ALPRAZolam, nitroGLYCERIN, ondansetron (ZOFRAN) IV, sodium chloride flush   Assessment/Plan   1. Chronic systolic CHF: History of ischemic cardiomyopathy, recent stent placement. Echo 11/2016 EF 25-30%. NYHA III, remains volume overloaded but improved now with weight down and CVP 9-10, discharge weight in March 2018 was 181 pounds. Co-ox today 59%.  - Reduce Lasix to 80 mg IV BID.  - Reduce hydralazine to 71m TID for now with soft BP's. Continue Imdur.  -  Continue Coreg 3.125 mg BID - Continue digoxin, dig level <0.2  2. CKD: Stage 3. Creatinine bumped today, but this is more towards his baseline of 1.9-2.1.  3. CAD: NSTEMI in 3/18. LHC with 99% ulcerated lesion proximal RCA with left to right collaterals, 95% mid LAD stenosis after mid LAD stent. s/p PCI to RCA and LAD on 11/25/16. - Continue Plavix.  - No ASA with Plavix and coumadin - Continue high dose statin.  - Continue ranolazine.  - No change to current plan.  4. h/o DVTs:  Warfarin per pharmacy -  INR 1.19 5. Chronic N/V/D with gastroparesis with exacerbation. - Continue Reglan before meals.  - No change to current plan. 6. Fever:  Patient endorses chills yesterday and overnight, Tmax 100.5.  Denies cough, URI symptoms.  Some leukocytosis today. CXR yesterday did not show PNA.  - Will draw blood cultures, UA/urine culture.   Length of Stay: 2[place] NP  02/10/2017, 7:03 AM  Advanced Heart Failure Team Pager [phone](M-F; 7a - 4p)  Please contact [facility] for night-coverage after hours (4p -7a ) and weekends on [email or web address]  Patient seen with NP, agree with the above note.  He diuresed well yesterday, weight down about 10 lbs.  Breathing better.  CVP down to 9-10 with rise in creatinine (to around his baseline).  Co-ox adequate at 59%.  - Will give Lasix 80 mg IV bid today with no metolazone.  May be ready for po tomorrow.   Low grade fever, subjective chills, rise in WBCs => ?URI.  No PNA on CXR.  Will send cultures and UA.   DDaryel Kenneth6/04/2017 8:26 AM

## 2017-02-10 NOTE — Progress Notes (Signed)
ANTICOAGULATION CONSULT NOTE - Follow Up Consult  Pharmacy Consult for Coumadin Indication: hx recurrent DVTs  Allergies  Allergen Reactions  . Metformin And Related Diarrhea    Patient Measurements: Height: 5' 8"  (172.7 cm) Weight: 193 lb 9 oz (87.8 kg) IBW/kg (Calculated) : 68.4   Vital Signs: Temp: 98.3 F (36.8 C) (06/08 0848) Temp Source: Oral (06/08 0848) BP: 125/64 (06/08 0848) Pulse Rate: 87 (06/08 0851)  Labs:  Recent Labs  02/08/17 1230 02/08/17 1250 02/09/17 0440 02/10/17 0512  HGB  --  10.3*  --  10.0*  HCT  --  31.7*  --  30.7*  PLT  --  239  --  263  LABPROT 14.3  --  14.8 15.2  INR 1.10  --  1.15 1.19  CREATININE 1.22  --  1.29* 1.90*    Estimated Creatinine Clearance: 49 mL/min (A) (by C-G formula based on SCr of 1.9 mg/dL (H)).  Assessment: 51yom continues on coumadin for hx recurrent DVTs. INR 1.1 on admission. INR 1.15 today. Spoke to Dr. Aundra Dubin about bridging and he does not want to bridge at this time.   Home dose (per last [facility] clinic visit 5/22): 9m daily except 7.549mTue/Sat.  Per recent notes from outpatient Coumadin clinic, it appears dose has been reduced several times due to elevated INRs.  Goal of Therapy:  INR 2-3 Monitor platelets by anticoagulation protocol: Yes   Plan:  1) Coumadin 7.27m53mgain tonight.  2) Daily INR  JesUvaldo RisingCPS  Clinical Pharmacist Pager ([phone]/04/2017 10:05 AM

## 2017-02-10 NOTE — Care Management Note (Signed)
Case Management Note  Patient Details  Name: RYDELL WIEGEL MRN: [id number] Date of Birth: Feb 03, 1966  Subjective/Objective:      Pt admitted with acute on chronic HF             Action/Plan:   PTA independent from home with children.  Pt states he weighs daily and adheres to low salt diet.  CM will continue to follow for discharge needs   Expected Discharge Date:                  Expected Discharge Plan:  Home/Self Care  In-House Referral:     Discharge planning Services  CM Consult  Post Acute Care Choice:    Choice offered to:     DME Arranged:    DME Agency:     HH Arranged:    HH Agency:     Status of Service:     If discussed at [employer] of Stay Meetings, dates discussed:    Additional Comments:  Maryclare Labrador, RN 02/10/2017, 2:50 PM

## 2017-02-10 NOTE — Plan of Care (Signed)
Problem: Safety: Goal: Ability to remain free from injury will improve Outcome: Progressing Patient free from falls  Problem: Physical Regulation: Goal: Ability to maintain clinical measurements within normal limits will improve Outcome: Not Progressing Patient febrile Goal: Will remain free from infection Outcome: Progressing Blood cultures pending  Problem: Fluid Volume: Goal: Ability to maintain a balanced intake and output will improve Outcome: Progressing Patient weight will continue to go down

## 2017-02-10 NOTE — Evaluation (Signed)
Physical Therapy Evaluation Patient Details Name: [name] MRN: [id number] DOB: Sep 08, 1965 Today's Date: 02/10/2017   History of Present Illness  pt is a 51 y/o male with pmh significant for CAD, ant MI, ICM, CHF (EF 25-30%) recent PCI to LAD, RCA, admitted from CHF clinic with weight gain, SOB and orthopnea.  Clinical Impression  Pt admitted with/for CHF/volume overload..  Pt currently limited functionally due to the problems listed below.  (see problems list.)  Pt will benefit from PT to maximize function and safety to be able to get home safely with available assist.     Follow Up Recommendations No PT follow up;Other (comment) (continue with CRP2)    Equipment Recommendations  None recommended by PT    Recommendations for Other Services       Precautions / Restrictions Precautions Precautions: Fall      Mobility  Bed Mobility Overal bed mobility: Modified Independent                Transfers Overall transfer level: Needs assistance   Transfers: Sit to/from Stand Sit to Stand: Supervision            Ambulation/Gait Ambulation/Gait assistance: Supervision Ambulation Distance (Feet): 150 Feet Assistive device: None Gait Pattern/deviations: Step-through pattern Gait velocity: slower Gait velocity interpretation: Below normal speed for age/gender General Gait Details: pt's gait is tentative and slow, but generally steady.  Pt expresses wariness and caution about pushing himself.  Stairs            Wheelchair Mobility    Modified Rankin (Stroke Patients Only)       Balance                                             Pertinent Vitals/Pain Pain Assessment: Faces Faces Pain Scale: Hurts little more Pain Location: feet Pain Descriptors / Indicators: Grimacing Pain Intervention(s): Monitored during session    Home Living Family/patient expects to be discharged to:: Private residence Living Arrangements:  Children Available Help at Discharge: Family;Available 24 hours/day Type of Home: House Home Access: Stairs to enter     Home Layout: One level Home Equipment: None      Prior Function Level of Independence: Independent with assistive device(s);Needs assistance (has up/down periods)   Gait / Transfers Assistance Needed: mostly independent and able to go grocery shopping. Drives, ?works--[employer] account, Pt reports sedentary by choice.  ADL's / Homemaking Assistance Needed: does all ADL's independent most of the time.        Hand Dominance        Extremity/Trunk Assessment        Lower Extremity Assessment Lower Extremity Assessment: RLE deficits/detail;LLE deficits/detail RLE Deficits / Details: functional strength at 4/5, painful foot with pf 3/5 LLE Deficits / Details: gross strength 3+/5 with pt not sustaining contraction well.       Communication   Communication: No difficulties  Cognition Arousal/Alertness: Awake/alert Behavior During Therapy: Flat affect Overall Cognitive Status: Within Functional Limits for tasks assessed                                        General Comments General comments (skin integrity, edema, etc.): vitals stable    Exercises     Assessment/Plan    PT Assessment Patient needs  continued PT services  PT Problem List Decreased strength;Decreased balance;Decreased mobility;Decreased coordination;Decreased activity tolerance;Cardiopulmonary status limiting activity       PT Treatment Interventions Gait training;Stair training;Functional mobility training;Therapeutic activities;Patient/family education    PT Goals (Current goals can be found in the Care Plan section)  Acute Rehab PT Goals Patient Stated Goal: pt not interested in goal setting PT Goal Formulation: With patient Time For Goal Achievement: 02/24/17 Potential to Achieve Goals: Good    Frequency Min 3X/week   Barriers to discharge         Co-evaluation               AM-PAC PT "6 Clicks" Daily Activity  Outcome Measure Difficulty turning over in bed (including adjusting bedclothes, sheets and blankets)?: A Little Difficulty moving from lying on back to sitting on the side of the bed? : A Little Difficulty sitting down on and standing up from a chair with arms (e.g., wheelchair, bedside commode, etc,.)?: A Little Help needed moving to and from a bed to chair (including a wheelchair)?: A Little Help needed walking in hospital room?: A Little Help needed climbing 3-5 steps with a railing? : A Little 6 Click Score: 18    End of Session   Activity Tolerance: Patient tolerated treatment well Patient left: in bed;with call bell/phone within reach Nurse Communication: Mobility status PT Visit Diagnosis: Other abnormalities of gait and mobility (R26.89);Muscle weakness (generalized) (M62.81)    Time: 0929-5747 PT Time Calculation (min) (ACUTE ONLY): 13 min   Charges:   PT Evaluation $PT Eval Low Complexity: 1 Procedure     PT G Codes:        Mar 10, 2017  Donnella Sham, PT [phone]  (pager)  Tessie Fass Ronal Maybury March 10, 2017, 1:19 PM

## 2017-02-10 NOTE — Progress Notes (Signed)
Notified Heart Failure team re: temp of 102; will give tylenol and continue to monitor.  Blood cultures pending at this time.

## 2017-02-10 NOTE — Progress Notes (Signed)
Pharmacy Antibiotic Note  [name] is a 51 y.o. male admitted on 02/08/2017 with volume overload.  Pharmacy has been consulted for vancomycin and Zosyn dosing for fever. WBC 11.5 >> 9. Blood cultures pending. CKD with SCr 1.9 (baseline 1.9-2.1).   Plan: Vancomycin 1500 mg IV q24h, goal 15-20 mcg/ml Zosyn 3.375 g IV q8h to be infused over 4 hours Monitor renal function, clinical progress and culture data   Height: 5' 8"  (172.7 cm) Weight: 193 lb 9 oz (87.8 kg) IBW/kg (Calculated) : 68.4  Temp (24hrs), Avg:99.9 F (37.7 C), Min:98.3 F (36.8 C), Max:102 F (38.9 C)   Recent Labs Lab 02/08/17 1230 02/08/17 1250 02/09/17 0440 02/10/17 0512  WBC  --  9.0  --  11.5*  CREATININE 1.22  --  1.29* 1.90*    Estimated Creatinine Clearance: 49 mL/min (A) (by C-G formula based on SCr of 1.9 mg/dL (H)).    Allergies  Allergen Reactions  . Metformin And Related Diarrhea    Antimicrobials this admission: Vancomycin 6/8 >>  Zosyn 6/8 >>   Dose adjustments this admission: n/a  Microbiology results: 6/8 BCx: pending 6/8 UCx: pending  6/6 MRSA PCR: NEG  Thank you for allowing pharmacy to be a part of this patient's care.  Renold Genta, PharmD, BCPS Clinical Pharmacist Phone for tonight - [place] - [phone] 02/10/2017 4:53 PM

## 2017-02-11 ENCOUNTER — Inpatient Hospital Stay (HOSPITAL_COMMUNITY): Payer: BLUE CROSS/BLUE SHIELD

## 2017-02-11 DIAGNOSIS — N179 Acute kidney failure, unspecified: Secondary | ICD-10-CM

## 2017-02-11 LAB — PROTIME-INR
INR: 1.23
PROTHROMBIN TIME: 15.5 s — AB (ref 11.4–15.2)

## 2017-02-11 LAB — CBC
HCT: 30.2 % — ABNORMAL LOW (ref 39.0–52.0)
HEMOGLOBIN: 10 g/dL — AB (ref 13.0–17.0)
MCH: 25.6 pg — AB (ref 26.0–34.0)
MCHC: 33.1 g/dL (ref 30.0–36.0)
MCV: 77.4 fL — AB (ref 78.0–100.0)
Platelets: 268 10*3/uL (ref 150–400)
RBC: 3.9 MIL/uL — AB (ref 4.22–5.81)
RDW: 19.4 % — ABNORMAL HIGH (ref 11.5–15.5)
WBC: 9.8 10*3/uL (ref 4.0–10.5)

## 2017-02-11 LAB — URINE CULTURE: Culture: NO GROWTH

## 2017-02-11 LAB — GLUCOSE, CAPILLARY
GLUCOSE-CAPILLARY: 100 mg/dL — AB (ref 65–99)
GLUCOSE-CAPILLARY: 210 mg/dL — AB (ref 65–99)
Glucose-Capillary: 126 mg/dL — ABNORMAL HIGH (ref 65–99)
Glucose-Capillary: 132 mg/dL — ABNORMAL HIGH (ref 65–99)

## 2017-02-11 LAB — BASIC METABOLIC PANEL
Anion gap: 8 (ref 5–15)
BUN: 33 mg/dL — AB (ref 6–20)
CO2: 27 mmol/L (ref 22–32)
Calcium: 8.3 mg/dL — ABNORMAL LOW (ref 8.9–10.3)
Chloride: 101 mmol/L (ref 101–111)
Creatinine, Ser: 2.34 mg/dL — ABNORMAL HIGH (ref 0.61–1.24)
GFR, EST AFRICAN AMERICAN: 35 mL/min — AB (ref >60)
GFR, EST NON AFRICAN AMERICAN: 30 mL/min — AB (ref >60)
Glucose, Bld: 106 mg/dL — ABNORMAL HIGH (ref 65–99)
Potassium: 4.4 mmol/L (ref 3.5–5.1)
SODIUM: 136 mmol/L (ref 135–145)

## 2017-02-11 LAB — COOXEMETRY PANEL
CARBOXYHEMOGLOBIN: 1.4 % (ref 0.5–1.5)
Methemoglobin: 1.2 % (ref 0.0–1.5)
O2 SAT: 70.3 %
Total hemoglobin: 10.2 g/dL — ABNORMAL LOW (ref 12.0–16.0)

## 2017-02-11 MED ORDER — WARFARIN SODIUM 5 MG PO TABS
5.0000 mg | ORAL_TABLET | Freq: Once | ORAL | Status: AC
  Administered 2017-02-11: 5 mg via ORAL
  Filled 2017-02-11: qty 1

## 2017-02-11 NOTE — Progress Notes (Signed)
Advanced Heart Failure Rounding Note  PCP:  Dr. Ola Spurr Primary Cardiologist: Dr. Aundra Dubin   Subjective:    Admitted from [facility] clinic on 02/08/17 with volume overload. Weight is now down about 12 lbs.  CVP 8 today with co-ox 70%.    Creatinine 1.22->1.29->1.90 -> 2.34.   Fever to 102 yesterday with chills.  Started on empiric vancomycin/Zosyn.  Cultures so far negative.    Objective:   Weight Range: 191 lb 4.8 oz (86.8 kg) Body mass index is 29.09 kg/m.   Vital Signs:   Temp:  [98.4 F (36.9 C)-102 F (38.9 C)] 98.4 F (36.9 C) (06/09 0821) Pulse Rate:  [90-94] 94 (06/08 2326) Resp:  [18-26] 26 (06/08 2326) BP: (108-133)/(55-80) 108/58 (06/09 0313) SpO2:  [91 %-97 %] 91 % (06/08 2326) Weight:  [191 lb 4.8 oz (86.8 kg)] 191 lb 4.8 oz (86.8 kg) (06/09 0539) Last BM Date: 02/08/17  Weight change: Filed Weights   02/09/17 0402 02/10/17 0515 02/11/17 0539  Weight: 203 lb 11.3 oz (92.4 kg) 193 lb 9 oz (87.8 kg) 191 lb 4.8 oz (86.8 kg)    Intake/Output:   Intake/Output Summary (Last 24 hours) at 02/11/17 0851 Last data filed at 02/11/17 0517  Gross per 24 hour  Intake              960 ml  Output             2400 ml  Net            -1440 ml     Physical Exam: General: Male, NAD. Lying in bed.  HEENT: Normal.  Neck: supple. JVP 8 cm.  Carotids 2+ bilat; no bruits. No lymphadenopathy or thyromegaly appreciated. Cor: Lateral nondisplaced. Regular rate and rhythm. No rubs, gallops or murmurs. Lungs: Clear bilaterally.    Abdomen: soft, nontender, distention improved. No hepatosplenomegaly. No bruits or masses. Good bowel sounds. Extremities: no cyanosis, clubbing, rash. No peripheral edema.  Neuro: alert & orientedx3, cranial nerves grossly intact. moves all 4 extremities w/o difficulty. Affect pleasant Skin: PICC insertion site without redness or discharge.   Telemetry: Personally reviewed, NSR   Labs: CBC  Recent Labs  02/10/17 0512 02/11/17 0513  WBC  11.5* 9.8  HGB 10.0* 10.0*  HCT 30.7* 30.2*  MCV 76.8* 77.4*  PLT 263 263   Basic Metabolic Panel  Recent Labs  02/08/17 1230 02/09/17 0440 02/10/17 0512 02/11/17 0513  NA 141 138 138 136  K 3.5 3.0* 3.5 4.4  CL 113* 109 104 101  CO2 22 22 24 27   GLUCOSE 63* 71 48* 106*  BUN 17 20 26* 33*  CREATININE 1.22 1.29* 1.90* 2.34*  CALCIUM 8.3* 8.0* 8.4* 8.3*  MG 1.3* 2.0  --   --    Liver Function Tests  Recent Labs  02/08/17 1230  AST 33  ALT 35  ALKPHOS 137*  BILITOT 0.9  PROT 6.9  ALBUMIN 3.1*   No results for input(s): LIPASE, AMYLASE in the last 72 hours. Cardiac Enzymes No results for input(s): CKTOTAL, CKMB, CKMBINDEX, TROPONINI in the last 72 hours.  BNP: BNP (last 3 results)  Recent Labs  01/03/17 1039 01/17/17 1204 02/08/17 1250  BNP 520.2* 353.6* 1,330.6*      Medications:     Scheduled Medications: . atorvastatin  80 mg Oral q1800  . carvedilol  3.125 mg Oral BID WC  . clopidogrel  75 mg Oral Daily  . digoxin  0.125 mg Oral Daily  . hydrALAZINE  25 mg Oral TID  . insulin aspart  0-9 Units Subcutaneous TID WC  . insulin glargine  28 Units Subcutaneous QHS  . isosorbide mononitrate  90 mg Oral Daily  . metoCLOPramide  2.5 mg Oral TID AC  . potassium chloride  40 mEq Oral BID  . ranolazine  500 mg Oral BID  . sodium chloride flush  10-40 mL Intracatheter Q12H  . Warfarin - Pharmacist Dosing Inpatient   Does not apply q1800    Infusions: . piperacillin-tazobactam (ZOSYN)  IV 3.375 g (02/11/17 0832)  . vancomycin      PRN Medications: acetaminophen, ALPRAZolam, nitroGLYCERIN, ondansetron (ZOFRAN) IV, sodium chloride flush   Assessment/Plan   1. Acute on chronic systolic CHF: History of ischemic cardiomyopathy, recent stent placement. Echo 11/2016 EF 25-30%. Weight now down about 12 lbs total and CVP down to 8.  Co-ox 70%.  Creatinine up to 2.3 today. - He got a dose of IV Lasix this morning, will stop Lasix now.  Anticipate home  eventually on higher torsemide dose, probably use 80 mg daily.  Would hold until creatinine stabilizes.  - Reduced hydralazine to 28m TID with soft BP, now better.  Would increase back to his baseline dose prior to discharge. Continue Imdur.  - Continue Coreg 3.125 mg BID - Continue digoxin, dig level <0.2  2. CKD: Stage 3.  Creatinine higher today at 2.3, but this is not much higher than his usual baseline of 1.9-2.1.  3. CAD: NSTEMI in 3/18. LHC with 99% ulcerated lesion proximal RCA with left to right collaterals, 95% mid LAD stenosis after mid LAD stent. s/p PCI to RCA and LAD on 11/25/16. - Continue Plavix.  - No ASA with Plavix and coumadin - Continue high dose statin.  - Continue ranolazine.  - No change to current plan.  4. h/o DVTs:  Warfarin per pharmacy -  INR 1.23 5. Chronic N/V/D with gastroparesis with exacerbation. - Continue Reglan before meals.  - No change to current plan. 6. Fever:  Up to 102 yesterday with chills, afebrile this morning.  Started on empiric vancomycin/Zosyn.  No localizing symptoms.  Cultures so far negative and UA negative.  - Follow cultures.  - Repeat CXR PA/lateral.  - Will remove PICC line.    Length of Stay: 3  DLoralie Champagne MD  02/11/2017, 8:51 AM  Advanced Heart Failure Team Pager [phone](M-F; 7a - 4p)  Please contact [facility] for night-coverage after hours (4p -7a ) and weekends on [email or web address]

## 2017-02-11 NOTE — Progress Notes (Signed)
PT arrived to the unit, no complaints at this time. Placed on tele Pt oriented to room.

## 2017-02-11 NOTE — Progress Notes (Addendum)
ANTICOAGULATION CONSULT NOTE - Follow Up Consult  Pharmacy Consult for Coumadin Indication: hx recurrent DVTs  Allergies  Allergen Reactions  . Metformin And Related Diarrhea    Patient Measurements: Height: 5' 5.8" (167.1 cm) Weight: 189 lb 6 oz (85.9 kg) (scale B) IBW/kg (Calculated) : 63.34   Vital Signs: Temp: 98.2 F (36.8 C) (06/09 1109) Temp Source: Oral (06/09 1109) BP: 105/59 (06/09 1109) Pulse Rate: 77 (06/09 1109)  Labs:  Recent Labs  02/08/17 1250 02/09/17 0440 02/10/17 0512 02/11/17 0513  HGB 10.3*  --  10.0* 10.0*  HCT 31.7*  --  30.7* 30.2*  PLT 239  --  263 268  LABPROT  --  14.8 15.2 15.5*  INR  --  1.15 1.19 1.23  CREATININE  --  1.29* 1.90* 2.34*    Estimated Creatinine Clearance: 37.8 mL/min (A) (by C-G formula based on SCr of 2.34 mg/dL (H)).  Assessment: 51yoM continues on coumadin for hx recurrent DVTs. INR 1.1 on admission trending up slowly to 1.23 today, suspect it will begin trending up after 7.43m dose x3. Spoke to Dr. MAundra Dubinabout bridging and he does not want to bridge at this time.   [facility] 540mdaily except 7.15m50mue/Sat.  Per recent notes from outpatient Coumadin clinic, it appears dose has been reduced several times due to elevated INRs.  Goal of Therapy:  INR 2-3 Monitor platelets by anticoagulation protocol: Yes   Plan:  -Coumadin 15mg33m x1 tonight -Daily INR -Monitor S/Sx bleeding closely  MichArrie SenatearmD PGY-1 Pharmacy Resident Pager: [phone]

## 2017-02-11 NOTE — Progress Notes (Signed)
RN IV team RN  assessed pt for IV Peripheral access with no success with ultrasound, Requested we keep picc line in tand I will contact provider. MD made aware keep picc line in for tonight and will assess ion the AM.

## 2017-02-12 LAB — CBC
HCT: 31.4 % — ABNORMAL LOW (ref 39.0–52.0)
Hemoglobin: 10.2 g/dL — ABNORMAL LOW (ref 13.0–17.0)
MCH: 24.9 pg — ABNORMAL LOW (ref 26.0–34.0)
MCHC: 32.5 g/dL (ref 30.0–36.0)
MCV: 76.6 fL — ABNORMAL LOW (ref 78.0–100.0)
PLATELETS: 273 10*3/uL (ref 150–400)
RBC: 4.1 MIL/uL — ABNORMAL LOW (ref 4.22–5.81)
RDW: 18.7 % — AB (ref 11.5–15.5)
WBC: 8.6 10*3/uL (ref 4.0–10.5)

## 2017-02-12 LAB — GLUCOSE, CAPILLARY
GLUCOSE-CAPILLARY: 174 mg/dL — AB (ref 65–99)
Glucose-Capillary: 96 mg/dL (ref 65–99)
Glucose-Capillary: 142 mg/dL — ABNORMAL HIGH (ref 65–99)
Glucose-Capillary: 150 mg/dL — ABNORMAL HIGH (ref 65–99)

## 2017-02-12 LAB — BASIC METABOLIC PANEL
ANION GAP: 7 (ref 5–15)
BUN: 40 mg/dL — ABNORMAL HIGH (ref 6–20)
CALCIUM: 8.3 mg/dL — AB (ref 8.9–10.3)
CO2: 27 mmol/L (ref 22–32)
Chloride: 99 mmol/L — ABNORMAL LOW (ref 101–111)
Creatinine, Ser: 2.64 mg/dL — ABNORMAL HIGH (ref 0.61–1.24)
GFR, EST AFRICAN AMERICAN: 30 mL/min — AB (ref >60)
GFR, EST NON AFRICAN AMERICAN: 26 mL/min — AB (ref >60)
Glucose, Bld: 97 mg/dL (ref 65–99)
Potassium: 4.5 mmol/L (ref 3.5–5.1)
Sodium: 133 mmol/L — ABNORMAL LOW (ref 135–145)

## 2017-02-12 LAB — PROTIME-INR
INR: 1.32
Prothrombin Time: 16.5 seconds — ABNORMAL HIGH (ref 11.4–15.2)

## 2017-02-12 MED ORDER — WARFARIN SODIUM 7.5 MG PO TABS
7.5000 mg | ORAL_TABLET | Freq: Once | ORAL | Status: AC
  Administered 2017-02-12: 7.5 mg via ORAL
  Filled 2017-02-12: qty 1

## 2017-02-12 NOTE — Progress Notes (Signed)
ANTICOAGULATION CONSULT NOTE - Follow Up Consult  Pharmacy Consult for Coumadin Indication: hx recurrent DVTs  Allergies  Allergen Reactions  . Metformin And Related Diarrhea    Patient Measurements: Height: 5' 5.8" (167.1 cm) Weight: 186 lb 14.4 oz (84.8 kg) IBW/kg (Calculated) : 63.34   Vital Signs: Temp: 98.3 F (36.8 C) (06/10 0504) Temp Source: Oral (06/10 0504) BP: 115/66 (06/10 0504) Pulse Rate: 73 (06/10 0504)  Labs:  Recent Labs  02/10/17 0512 02/11/17 0513 02/12/17 0406  HGB 10.0* 10.0* 10.2*  HCT 30.7* 30.2* 31.4*  PLT 263 268 273  LABPROT 15.2 15.5* 16.5*  INR 1.19 1.23 1.32  CREATININE 1.90* 2.34* 2.64*    Estimated Creatinine Clearance: 33.3 mL/min (A) (by C-G formula based on SCr of 2.64 mg/dL (H)).  Assessment: 51yoM continues on coumadin for hx recurrent DVTs. INR 1.1 on admission now trending up slowly to 1.32 today. No plans to bridge per Dr. Aundra Dubin.   Home dose (per last [facility] visit 5/22): 57m daily except 7.563mTue/Sat.  Per recent notes from outpatient Coumadin clinic, it appears dose has been reduced several times due to elevated INRs.  Goal of Therapy:  INR 2-3 Monitor platelets by anticoagulation protocol: Yes   Plan:  -Coumadin 7.50m33mO x1 tonight -Daily INR -Monitor S/Sx bleeding closely  MicArrie SenateharmD PGY-1 Pharmacy Resident Pager: [phone]/2018

## 2017-02-12 NOTE — Progress Notes (Signed)
PCP:  Dr. Ola Spurr Primary Cardiologist: Dr. Aundra Dubin   Subjective:    Admitted from [facility] clinic on 02/08/17 with volume overload. Weight is now down about 16 lbs.  PICC line discontinued yesterday.   Creatinine 1.22->1.29->1.90 -> 2.34-> 2.64.   Afebrile.    Objective:   Weight Range: 186 lb 14.4 oz (84.8 kg) Body mass index is 30.35 kg/m.   Vital Signs:   Temp:  [98.2 F (36.8 C)-98.8 F (37.1 C)] 98.3 F (36.8 C) (06/10 0504) Pulse Rate:  [73-78] 73 (06/10 0504) Resp:  [18-20] 18 (06/10 0504) BP: (105-115)/(59-71) 115/66 (06/10 0504) SpO2:  [95 %-99 %] 99 % (06/10 0504) Weight:  [186 lb 14.4 oz (84.8 kg)-189 lb 6 oz (85.9 kg)] 186 lb 14.4 oz (84.8 kg) (06/10 0504) Last BM Date: 02/11/17  Weight change: Filed Weights   02/11/17 0539 02/11/17 1109 02/12/17 0504  Weight: 191 lb 4.8 oz (86.8 kg) 189 lb 6 oz (85.9 kg) 186 lb 14.4 oz (84.8 kg)    Intake/Output:   Intake/Output Summary (Last 24 hours) at 02/12/17 0936 Last data filed at 02/12/17 0926  Gross per 24 hour  Intake             1580 ml  Output             1150 ml  Net              430 ml     Physical Exam: General: Male, NAD.  HEENT: Normal.  Neck: supple. JVP 6 cm.  Carotids 2+ bilat; no bruits. No lymphadenopathy or thyromegaly appreciated. Cor: Lateral nondisplaced. Regular rate and rhythm. No rubs, gallops or murmurs. Lungs: Clear bilaterally.    Abdomen: soft, nontender, distention improved. No hepatosplenomegaly. No bruits or masses. Good bowel sounds. Extremities: no cyanosis, clubbing, rash. No peripheral edema.  Neuro: alert & orientedx3, cranial nerves grossly intact. moves all 4 extremities w/o difficulty. Affect pleasant Skin: PICC insertion site without redness or discharge.   Telemetry: Personally reviewed, NSR   Labs: CBC  Recent Labs  02/11/17 0513 02/12/17 0406  WBC 9.8 8.6  HGB 10.0* 10.2*  HCT 30.2* 31.4*  MCV 77.4* 76.6*  PLT 268 297   Basic Metabolic  Panel  Recent Labs  02/11/17 0513 02/12/17 0406  NA 136 133*  K 4.4 4.5  CL 101 99*  CO2 27 27  GLUCOSE 106* 97  BUN 33* 40*  CREATININE 2.34* 2.64*  CALCIUM 8.3* 8.3*   Liver Function Tests No results for input(s): AST, ALT, ALKPHOS, BILITOT, PROT, ALBUMIN in the last 72 hours. No results for input(s): LIPASE, AMYLASE in the last 72 hours. Cardiac Enzymes No results for input(s): CKTOTAL, CKMB, CKMBINDEX, TROPONINI in the last 72 hours.  BNP: BNP (last 3 results)  Recent Labs  01/03/17 1039 01/17/17 1204 02/08/17 1250  BNP 520.2* 353.6* 1,330.6*      Medications:     Scheduled Medications: . atorvastatin  80 mg Oral q1800  . carvedilol  3.125 mg Oral BID WC  . clopidogrel  75 mg Oral Daily  . digoxin  0.125 mg Oral Daily  . hydrALAZINE  25 mg Oral TID  . insulin aspart  0-9 Units Subcutaneous TID WC  . insulin glargine  28 Units Subcutaneous QHS  . isosorbide mononitrate  90 mg Oral Daily  . metoCLOPramide  2.5 mg Oral TID AC  . potassium chloride  40 mEq Oral BID  . ranolazine  500 mg Oral BID  .  sodium chloride flush  10-40 mL Intracatheter Q12H  . warfarin  7.5 mg Oral ONCE-1800  . Warfarin - Pharmacist Dosing Inpatient   Does not apply q1800    Infusions: . piperacillin-tazobactam (ZOSYN)  IV 3.375 g (02/12/17 0916)  . vancomycin Stopped (02/11/17 1900)    PRN Medications: acetaminophen, ALPRAZolam, nitroGLYCERIN, ondansetron (ZOFRAN) IV, sodium chloride flush   Assessment/Plan   1. Acute on chronic systolic CHF: History of ischemic cardiomyopathy, recent stent placement. Echo 11/2016 EF 25-30%. Weight now down about 16 lbs total.  Creatinine up to 2.64 today. - lasix on hold due to rising creatinine. Check BMET daily.   - Reduced hydralazine to 21m TID with soft BP, now better.  Would increase back to his baseline dose prior to discharge. Continue Imdur.  - Continue Coreg 3.125 mg BID - Continue digoxin, dig level <0.2  2. CKD: Stage 3.   Creatinine higher today at 2.64,  baseline of 1.9-2.1. Diuretics on hold 3. CAD: NSTEMI in 3/18. LHC with 99% ulcerated lesion proximal RCA with left to right collaterals, 95% mid LAD stenosis after mid LAD stent. s/p PCI to RCA and LAD on 11/25/16. - Continue Plavix.  - No ASA with Plavix and coumadin - Continue high dose statin.  - Continue ranolazine.  - No change to current plan.  4. h/o DVTs:  Warfarin per pharmacy -  INR 1.23 5. Chronic N/V/D with gastroparesis with exacerbation. - Continue Reglan before meals.  - No change to current plan. 6. Fever:  Up to 102 day before yesterday with chills, afebrile this morning.  Started on empiric vancomycin/Zosyn.  No localizing symptoms.  Cultures so far negative and UA negative. CXR normal. If Blood cultures negative tomorrow would DC antibiotics and follow.    Length of Stay: 4  Katia Hannen JMartinique MD [facility]6/06/2017, 9:36 AM

## 2017-02-12 NOTE — Progress Notes (Signed)
PA notified that IV team was unable to place peripheral IV. Per PA, will await for heart failure team's input Monday; keep PICC in place for today. Pt afebrile this morning.

## 2017-02-13 ENCOUNTER — Encounter (HOSPITAL_COMMUNITY): Admission: RE | Admit: 2017-02-13 | Payer: BLUE CROSS/BLUE SHIELD | Source: Ambulatory Visit

## 2017-02-13 LAB — PROTIME-INR
INR: 1.41
PROTHROMBIN TIME: 17.4 s — AB (ref 11.4–15.2)

## 2017-02-13 LAB — CBC
HCT: 32.1 % — ABNORMAL LOW (ref 39.0–52.0)
Hemoglobin: 10.4 g/dL — ABNORMAL LOW (ref 13.0–17.0)
MCH: 25 pg — ABNORMAL LOW (ref 26.0–34.0)
MCHC: 32.4 g/dL (ref 30.0–36.0)
MCV: 77.2 fL — ABNORMAL LOW (ref 78.0–100.0)
Platelets: 282 10*3/uL (ref 150–400)
RBC: 4.16 MIL/uL — ABNORMAL LOW (ref 4.22–5.81)
RDW: 18.6 % — AB (ref 11.5–15.5)
WBC: 7.3 10*3/uL (ref 4.0–10.5)

## 2017-02-13 LAB — BASIC METABOLIC PANEL
Anion gap: 7 (ref 5–15)
BUN: 41 mg/dL — AB (ref 6–20)
CALCIUM: 8.3 mg/dL — AB (ref 8.9–10.3)
CO2: 25 mmol/L (ref 22–32)
CREATININE: 2.28 mg/dL — AB (ref 0.61–1.24)
Chloride: 102 mmol/L (ref 101–111)
GFR, EST AFRICAN AMERICAN: 36 mL/min — AB (ref >60)
GFR, EST NON AFRICAN AMERICAN: 31 mL/min — AB (ref >60)
Glucose, Bld: 76 mg/dL (ref 65–99)
Potassium: 4.5 mmol/L (ref 3.5–5.1)
SODIUM: 134 mmol/L — AB (ref 135–145)

## 2017-02-13 LAB — GLUCOSE, CAPILLARY: GLUCOSE-CAPILLARY: 72 mg/dL (ref 65–99)

## 2017-02-13 MED ORDER — CARVEDILOL 6.25 MG PO TABS: 3.1250 mg | ORAL_TABLET | Freq: Two times a day (BID) | ORAL | 0 refills | Status: DC

## 2017-02-13 MED ORDER — INSULIN GLARGINE 100 UNIT/ML ~~LOC~~ SOLN: 28.0000 [IU] | Freq: Every day | SUBCUTANEOUS | 11 refills | Status: DC

## 2017-02-13 MED ORDER — HYDRALAZINE HCL 25 MG PO TABS: 25.0000 mg | ORAL_TABLET | Freq: Three times a day (TID) | ORAL | 6 refills | Status: DC

## 2017-02-13 MED ORDER — WARFARIN SODIUM 10 MG PO TABS
10.0000 mg | ORAL_TABLET | Freq: Once | ORAL | Status: AC
  Administered 2017-02-13: 10 mg via ORAL
  Filled 2017-02-13: qty 1

## 2017-02-13 MED ORDER — LEVOFLOXACIN 500 MG PO TABS
500.0000 mg | ORAL_TABLET | Freq: Every day | ORAL | 0 refills | Status: AC
Start: 2017-02-14 — End: 2017-02-17

## 2017-02-13 MED ORDER — TORSEMIDE 20 MG PO TABS: 60.0000 mg | ORAL_TABLET | Freq: Every day | ORAL | 3 refills | Status: DC

## 2017-02-13 NOTE — Progress Notes (Addendum)
Pharmacy Antibiotic Note  [name] is a 51 y.o. male admitted on 02/08/2017 with volume overload.  Pharmacy has been consulted for vancomycin and Zosyn dosing for fever. WBC 11.5 >> 7.3. Blood cultures remain negative. CKD with SCr worsening to 2.28.  Plan: D/c vancomycin and Zosyn at discharge today. Planning Levaquin 500 mg po x 3 days.   Height: 5' 5.8" (167.1 cm) Weight: 187 lb 4.8 oz (85 kg) IBW/kg (Calculated) : 63.34  Temp (24hrs), Avg:98 F (36.7 C), Min:97.7 F (36.5 C), Max:98.4 F (36.9 C)   Recent Labs Lab 02/08/17 1250 02/09/17 0440 02/10/17 0512 02/11/17 0513 02/12/17 0406 02/13/17 0430  WBC 9.0  --  11.5* 9.8 8.6 7.3  CREATININE  --  1.29* 1.90* 2.34* 2.64* 2.28*    Estimated Creatinine Clearance: 38.6 mL/min (A) (by C-G formula based on SCr of 2.28 mg/dL (H)).    Allergies  Allergen Reactions  . Metformin And Related Diarrhea    Antimicrobials this admission: Vancomycin 6/8 >> 6/11 Zosyn 6/8 >> 6/11 Levaquin 6/11 >  Dose adjustments this admission: n/a  Microbiology results: 6/8 BCx: NGTD 6/8 UCx: negative  6/6 MRSA PCR: neg  Thank you for allowing pharmacy to be a part of this patient's care.  Uvaldo Rising, BCPS  Clinical Pharmacist Pager [phone]  02/13/2017 10:48 AM

## 2017-02-13 NOTE — Discharge Summary (Signed)
Advanced Heart Failure Team  Discharge Summary   Patient ID: [name] MRN: [id number], DOB/AGE: [birth year] 51 y.o. Admit date: 02/08/2017 D/C date:     02/13/2017   Primary Discharge Diagnoses:  1. Acute on chronic systolic HF 2. Ischemic cardiomyopathy 3. CKD stage 3 4. CAD s/p recent coronary angioplasty 5. H/o DVTs 6. Fever of unclear source. Cultures and CXR negative  Hospital Course:  [name] is a 51 year old male with a past medical history of CAD s/p anterior MI in 2010 and recent staged PCI to distal LAD and proximal RCA in march 2018, ICM, chronic systolic CHF (EF 10-21%) in March 2018.  Admitted from the HF clinic with marked volume overload. Diuresed with IV lasix. Overall diuresed 15 pounds. Renal function was followed closely and remained stable. He will continue to be followed closely in the HF clinic and has follow up next week.   1. Acute on chronic systolic CHF: History of ischemic cardiomyopathy, recent stent placement. Echo 11/2016 EF 25-30%.  Diuresed with IV lasix until creatinine bumped so diuretics held and will be restarted at home at on 02/14/2017.  Plan to continue bb, hydralazine, imdur and dig. No spiro or ace with elevated creatinine.  2. CKD: Stage 3: - baseline creatinine 1.9-2.1. Renal function was followed closely.  No ACE/ARB/ARNI at discharge.  Repeat BMET at his follow up next week.  3. CAD: NSTEMI in 3/18. LHC with 99% ulcerated lesion proximal RCA with left to right collaterals, 95% mid LAD stenosis after mid LAD stent. s/p PCI to RCA and LAD on 11/25/16. No evidence of ischemia while hospitalized. Continue plavix. He is not asa with coumadin.  Continue bb, statin, and ranexa.  4. h/o DVTs:  He remained on coumadin. Pharmacy doses coumadin. He will follow later the week at the Coumadin Clinic. Goal 2-3.  5. Chronic N/V/D with gastroparesis with exacerbation.- controlled with reglan.  6. Fever:  Had fever thought to be respiratory related.  Completed 4 days of vanc and zosyn. He will be discharged on 3 days of Levaquin. If fever recurs will need TEE to evaluate for device infection .    Discharge Weight: 187 pounds Discharge Vitals: Blood pressure 131/68, pulse 79, temperature 97.7 F (36.5 C), temperature source Oral, resp. rate 20, height 5' 5.8" (1.671 m), weight 187 lb 4.8 oz (85 kg), SpO2 96 %.  Labs: Lab Results  Component Value Date   WBC 7.3 02/13/2017   HGB 10.4 (L) 02/13/2017   HCT 32.1 (L) 02/13/2017   MCV 77.2 (L) 02/13/2017   PLT 282 02/13/2017    Recent Labs Lab 02/08/17 1230  02/13/17 0430  NA 141  < > 134*  K 3.5  < > 4.5  CL 113*  < > 102  CO2 22  < > 25  BUN 17  < > 41*  CREATININE 1.22  < > 2.28*  CALCIUM 8.3*  < > 8.3*  PROT 6.9  --   --   BILITOT 0.9  --   --   ALKPHOS 137*  --   --   ALT 35  --   --   AST 33  --   --   GLUCOSE 63*  < > 76  < > = values in this interval not displayed. Lab Results  Component Value Date   CHOL 160 11/20/2016   HDL 39 (L) 11/20/2016   LDLCALC 99 11/20/2016   TRIG 111 11/20/2016   BNP (last 3 results)  Recent Labs  01/03/17 1039 01/17/17 1204 02/08/17 1250  BNP 520.2* 353.6* 1,330.6*    ProBNP (last 3 results) No results for input(s): PROBNP in the last 8760 hours.   Diagnostic Studies/Procedures   No results found.  Discharge Medications   Allergies as of 02/13/2017      Reactions   Metformin And Related Diarrhea      Medication List    STOP taking these medications   spironolactone 25 MG tablet Commonly known as:  ALDACTONE     TAKE these medications   atorvastatin 80 MG tablet Commonly known as:  LIPITOR Take 1 tablet (80 mg total) by mouth daily at 6 PM.   carvedilol 6.25 MG tablet Commonly known as:  COREG Take 0.5 tablets (3.125 mg total) by mouth 2 (two) times daily.   clopidogrel 75 MG tablet Commonly known as:  PLAVIX Take 1 tablet (75 mg total) by mouth daily.   digoxin 0.125 MG tablet Commonly known as:   LANOXIN Take 1 tablet (0.125 mg total) by mouth daily.   hydrALAZINE 25 MG tablet Commonly known as:  APRESOLINE Take 1 tablet (25 mg total) by mouth 3 (three) times daily. What changed:  medication strength  how much to take   insulin glargine 100 UNIT/ML injection Commonly known as:  LANTUS Inject 0.28 mLs (28 Units total) into the skin daily. What changed:  how much to take   isosorbide mononitrate 60 MG 24 hr tablet Commonly known as:  IMDUR Take 1.5 tablets (90 mg total) by mouth daily. What changed:  how much to take   levofloxacin 500 MG tablet Commonly known as:  LEVAQUIN Take 1 tablet (500 mg total) by mouth daily. Start taking on:  02/14/2017   metoCLOPramide 5 MG tablet Commonly known as:  REGLAN Take one tablet 1/2 hr before meals. What changed:  how much to take  how to take this  when to take this  reasons to take this  additional instructions   nitroGLYCERIN 0.4 MG SL tablet Commonly known as:  NITROSTAT Place 1 tablet (0.4 mg total) under the tongue every 5 (five) minutes as needed for chest pain.   ondansetron 4 MG disintegrating tablet Commonly known as:  ZOFRAN ODT Take 1 tablet (4 mg total) by mouth every 6 (six) hours as needed for nausea or vomiting.   ranolazine 500 MG 12 hr tablet Commonly known as:  RANEXA Take 1 tablet (500 mg total) by mouth 2 (two) times daily.   torsemide 20 MG tablet Commonly known as:  DEMADEX Take 3 tablets (60 mg total) by mouth daily. Start taking on:  02/14/2017   warfarin 5 MG tablet Commonly known as:  COUMADIN Take as directed by coumadin clinic What changed:  how much to take  how to take this  when to take this  additional instructions       Disposition   The patient will be discharged in stable condition to home. Discharge Instructions    (HEART FAILURE PATIENTS) Call MD:  Anytime you have any of the following symptoms: 1) 3 pound weight gain in 24 hours or 5 pounds in 1 week 2)  shortness of breath, with or without a dry hacking cough 3) swelling in the hands, feet or stomach 4) if you have to sleep on extra pillows at night in order to breathe.    Complete by:  As directed    Diet - low sodium heart healthy    Complete by:  As directed  Heart Failure patients record your daily weight using the same scale at the same time of day    Complete by:  As directed    Increase activity slowly    Complete by:  As directed    Other Restrictions    Complete by:  As directed    No work for until after follow up.     Follow-up Information    [facility] Follow up on 02/21/2017.   Specialty:  Cardiology Why:  with Dr Aundra Dubin at 11:00 am for hospital follow up. Garage code [phone].  Contact information: [address] [id number] [place] [phone]       [facility] [facility] Follow up on 02/17/2017.   Specialty:  Cardiology Why:  at 2:15  Contact information: [address], [place] [phone]            Duration of Discharge Encounter: Greater than 35 minutes   Jeanmarie Hubert  NP-C  02/13/2017, 10:42 AM  Patient seen and examined with Darrick Grinder, NP. We discussed all aspects of the encounter. I agree with the assessment and plan as stated above. I have added my comments in the text above.   [name], MD  10:12 PM

## 2017-02-13 NOTE — Progress Notes (Signed)
ANTICOAGULATION CONSULT NOTE - Follow Up Consult  Pharmacy Consult for Coumadin Indication: hx recurrent DVTs  Allergies  Allergen Reactions  . Metformin And Related Diarrhea    Patient Measurements: Height: 5' 5.8" (167.1 cm) Weight: 187 lb 4.8 oz (85 kg) IBW/kg (Calculated) : 63.34   Vital Signs: Temp: 97.7 F (36.5 C) (06/11 0300) Temp Source: Oral (06/11 0300) BP: 131/68 (06/11 1000) Pulse Rate: 79 (06/11 1000)  Labs:  Recent Labs  02/11/17 0513 02/12/17 0406 02/13/17 0430  HGB 10.0* 10.2* 10.4*  HCT 30.2* 31.4* 32.1*  PLT 268 273 282  LABPROT 15.5* 16.5* 17.4*  INR 1.23 1.32 1.41  CREATININE 2.34* 2.64* 2.28*    Estimated Creatinine Clearance: 38.6 mL/min (A) (by C-G formula based on SCr of 2.28 mg/dL (H)).  Assessment: 51yoM continues on coumadin for hx recurrent DVTs. INR 1.1 on admission now trending up slowly to 1.41 today. No plans to bridge per Dr. Aundra Dubin.   Home dose (per last [facility] clinic visit 5/22): 69m daily except 7.58mTue/Sat.  Per recent notes from outpatient Coumadin clinic, it appears dose has been reduced several times due to elevated INRs.  Wonder about compliance at home.  Goal of Therapy:  INR 2-3 Monitor platelets by anticoagulation protocol: Yes   Plan:  -Coumadin 10 mg PO x1 today before discharge.  Once home, recommend 7.5 mg daily starting tomorrow.  INR check later this week. -Daily INR -Monitor S/Sx bleeding closely  JeUvaldo RisingBCPS  Clinical Pharmacist Pager ([phone]/07/2017 10:33 AM

## 2017-02-13 NOTE — Progress Notes (Signed)
PCP:  Dr. Ola Spurr Primary Cardiologist: Dr. Aundra Dubin   Subjective:    Admitted from [place] clinic on 02/08/17 with acute on chronic CHF, co ox 74%. Weight down 15 pounds with IV lasix and metolazone.   Fever and leukocytosis on 02/10/17, started on Vanc and Zosyn. BCx negative so far.   Creatinine 1.90->2.34->2.64->2.28.  Says he feels good today. No SOB but feels like his breathing is more shallow when he lies flat, denies chest pain and palpitations.   Objective:   Weight Range: 187 lb 4.8 oz (85 kg) Body mass index is 30.42 kg/m.   Vital Signs:   Temp:  [97.7 F (36.5 C)-98.4 F (36.9 C)] 97.7 F (36.5 C) (06/11 0300) Pulse Rate:  [73-75] 73 (06/11 0300) Resp:  [20] 20 (06/11 0300) BP: (111-123)/(65-75) 123/75 (06/11 0300) SpO2:  [96 %-98 %] 96 % (06/11 0300) Weight:  [187 lb 4.8 oz (85 kg)] 187 lb 4.8 oz (85 kg) (06/11 0357) Last BM Date: 02/11/17  Weight change: Filed Weights   02/11/17 1109 02/12/17 0504 02/13/17 0357  Weight: 189 lb 6 oz (85.9 kg) 186 lb 14.4 oz (84.8 kg) 187 lb 4.8 oz (85 kg)    Intake/Output:   Intake/Output Summary (Last 24 hours) at 02/13/17 9798 Last data filed at 02/13/17 0600  Gross per 24 hour  Intake             1080 ml  Output             1150 ml  Net              -70 ml     Physical Exam: General: Well appearing. No resp difficulty. HEENT: normal Neck: supple. JVP 6 cm. Carotids 2+ bilat; no bruits. No thyromegaly or nodule noted. Cor: PMI nondisplaced. RRR, No M/G/R noted Lungs: CTAB, normal effort. Abdomen: soft, non-tender, distended, no HSM. No bruits or masses. +BS  Extremities: no cyanosis, clubbing, rash, R and LLE no edema.  Neuro: alert & orientedx3, cranial nerves grossly intact. moves all 4 extremities w/o difficulty. Affect flat.    Telemetry: NSR   Labs: CBC  Recent Labs  02/12/17 0406 02/13/17 0430  WBC 8.6 7.3  HGB 10.2* 10.4*  HCT 31.4* 32.1*  MCV 76.6* 77.2*  PLT 273 921   Basic Metabolic  Panel  Recent Labs  02/12/17 0406 02/13/17 0430  NA 133* 134*  K 4.5 4.5  CL 99* 102  CO2 27 25  GLUCOSE 97 76  BUN 40* 41*  CREATININE 2.64* 2.28*  CALCIUM 8.3* 8.3*   Liver Function Tests No results for input(s): AST, ALT, ALKPHOS, BILITOT, PROT, ALBUMIN in the last 72 hours. No results for input(s): LIPASE, AMYLASE in the last 72 hours. Cardiac Enzymes No results for input(s): CKTOTAL, CKMB, CKMBINDEX, TROPONINI in the last 72 hours.  BNP: BNP (last 3 results)  Recent Labs  01/03/17 1039 01/17/17 1204 02/08/17 1250  BNP 520.2* 353.6* 1,330.6*      Medications:     Scheduled Medications: . atorvastatin  80 mg Oral q1800  . carvedilol  3.125 mg Oral BID WC  . clopidogrel  75 mg Oral Daily  . digoxin  0.125 mg Oral Daily  . hydrALAZINE  25 mg Oral TID  . insulin aspart  0-9 Units Subcutaneous TID WC  . insulin glargine  28 Units Subcutaneous QHS  . isosorbide mononitrate  90 mg Oral Daily  . metoCLOPramide  2.5 mg Oral TID AC  . potassium  chloride  40 mEq Oral BID  . ranolazine  500 mg Oral BID  . sodium chloride flush  10-40 mL Intracatheter Q12H  . Warfarin - Pharmacist Dosing Inpatient   Does not apply q1800    Infusions: . piperacillin-tazobactam (ZOSYN)  IV Stopped (02/13/17 0600)  . vancomycin Stopped (02/12/17 1918)    PRN Medications: acetaminophen, ALPRAZolam, nitroGLYCERIN, ondansetron (ZOFRAN) IV, sodium chloride flush   Assessment/Plan   1. Acute on chronic systolic CHF: History of ischemic cardiomyopathy, recent stent placement. Echo 11/2016 EF 25-30%. Weight down 15 pounds total.    - Lasix held yesterday due to rising creatinine.  - BP stable, hydralazine was reduced to 74m TID.  - Continue Imdur 90 mg daily.  - Continue Coreg 3.125 mg BID.  - Continue digoxin, dig level <0.2  - Discharge weight in April 2018 was 171 pounds. Exam wise he does not appear volume overloaded, would start 674mtorsemide today.  2. CKD: Stage 3: -  baseline creatinine 1.9-2.1  - No ACE/ARB/ARNI at discharge.  3. CAD: NSTEMI in 3/18. LHC with 99% ulcerated lesion proximal RCA with left to right collaterals, 95% mid LAD stenosis after mid LAD stent. s/p PCI to RCA and LAD on 11/25/16. - Continue Plavix.  - No ASA as he is on coumadin.  - Continue high dose statin.  - Continue Ranexa.  4. h/o DVTs:  Warfarin per pharmacy -  INR 1.41. Goal 2-3.  5. Chronic N/V/D with gastroparesis with exacerbation. - Continue Reglan before meals.  - No change to current plan - He denies nausea and vomiting  6. Fever:   - T max 102.  - Started on [place] -  Has been afebrile for 48 hours. No leukocytosis.  - PICC remains in place as peripheral access was unable to be obtained.   Length of Stay: 5 [place]  02/13/2017, 7:12 AM  Advanced Heart Failure Team Pager [phone]-F; [place] Please contact [email or web address] for night-coverage after hours (4p -7a ) and weekends on [email or web address]  Patient seen and examined with ErJettie BoozeNP. We discussed all aspects of the encounter. I agree with the assessment and plan as stated above.   Volume status much improved. Creatinine coming back to baseline. Feels well. No further fevers. Cultures all negative. Will d/c home today with levofloxacin to finish 7 day course of abx. If fever recurs after abx stopped will need TEE to evaluate for device infection. D/w Dr. KlCaryl Comesho agrees. Continue Plavix for recent stent. Will need to follow closely in HF Clinic.   BeGlori BickersMD  6:38 PM

## 2017-02-15 ENCOUNTER — Encounter (HOSPITAL_COMMUNITY)
Admission: RE | Admit: 2017-02-15 | Discharge: 2017-02-15 | Disposition: A | Discharge status: Home / Self Care | Payer: BLUE CROSS/BLUE SHIELD | Source: Ambulatory Visit | Attending: Cardiology | Admitting: Cardiology

## 2017-02-15 DIAGNOSIS — Z955 Presence of coronary angioplasty implant and graft: Secondary | ICD-10-CM | POA: Diagnosis present

## 2017-02-15 DIAGNOSIS — I5022 Chronic systolic (congestive) heart failure: Secondary | ICD-10-CM

## 2017-02-15 DIAGNOSIS — I5023 Acute on chronic systolic (congestive) heart failure: Secondary | ICD-10-CM | POA: Diagnosis not present

## 2017-02-15 LAB — GLUCOSE, CAPILLARY: Glucose-Capillary: 109 mg/dL — ABNORMAL HIGH (ref 65–99)

## 2017-02-15 LAB — CULTURE, BLOOD (ROUTINE X 2)
CULTURE: NO GROWTH
Culture: NO GROWTH
SPECIAL REQUESTS: ADEQUATE
Special Requests: ADEQUATE

## 2017-02-15 NOTE — Progress Notes (Signed)
[id number] Reviewed home exercise guidelines with patient including endpoints, temperature precautions, target heart rate and rate of perceived exertion. Patient currently walking very little at home secondary to PAD pain.Encouraged patient to walk as tolerated taking rest breaks as needed, and patient is agreeable to plan of walking 10 minutes, 3 x's/day at least two days outside of cardiac rehab. Pt voices understanding of instructions given. Sol Passer, MS, ACSM CEP

## 2017-02-17 ENCOUNTER — Encounter (HOSPITAL_COMMUNITY)
Admission: RE | Admit: 2017-02-17 | Discharge: 2017-02-17 | Disposition: A | Discharge status: Home / Self Care | Payer: BLUE CROSS/BLUE SHIELD | Source: Ambulatory Visit | Attending: Cardiology | Admitting: Cardiology

## 2017-02-17 VITALS — Wt 189.4 lb

## 2017-02-17 DIAGNOSIS — Z955 Presence of coronary angioplasty implant and graft: Secondary | ICD-10-CM

## 2017-02-17 DIAGNOSIS — I5022 Chronic systolic (congestive) heart failure: Secondary | ICD-10-CM

## 2017-02-17 LAB — GLUCOSE, CAPILLARY: Glucose-Capillary: 182 mg/dL — ABNORMAL HIGH (ref 65–99)

## 2017-02-17 NOTE — Progress Notes (Signed)
Incomplete Session Note  Patient Details  Name: [name] MRN: [id number] Date of Birth: 1966/02/14 Referring Provider:     CARDIAC REHAB PHASE II ORIENTATION from 01/17/2017 in [facility]  Referring Provider  Candee Furbish, MD.      Erenest Blank [name] did not complete his rehab session.  Pt arrived to exercise today sighing out loud.  Asked pt whether everything was ok.  Pt stated "I'm exhausted."  Asked pt why he felt exhausted.  Pt indicated he did not know why.  Pt placed on monitor showed SR 78, bp 100/60, blood glucose 182.  Asked pt did he eat any breakfast. Pt stated he had a bowl of oatmeal. Praised pt for eating prior to exercise because typically he does not eat anything and will refuse offered snacks.  Asked pt about his morning.  Pt stated he woke up and felt ok.  Took him hour to get his clothes on.  Pt stated he didn't want to come to exercise but he did not want to get into any trouble.  Reassured pt that we are understanding. Pt just got out of the hospital and we understand the chronic nature of CHF.  Some days he will be good other days he may not feel as well.  Called the Heart Failure clinic provider line and relayed the above information.  No change in treatment this is patients baseline.  Indicated pt will not exercise today and cut back to two days a week verses three days.  Pt agreeable to this.  Offered pt gatorade.  Pt stated he felt some better.  Pt is accompanied by his friend and his 27 month old grandson that he is keeping today.  Pt in wheelchair with baby carrier in lap. Infant secured. Friend pushed wheelchair to the main entrance. Will follow up next week. Cherre Huger, BSN Cardiac and Training and development officer

## 2017-02-20 ENCOUNTER — Ambulatory Visit (INDEPENDENT_AMBULATORY_CARE_PROVIDER_SITE_OTHER): Payer: Self-pay

## 2017-02-20 ENCOUNTER — Encounter (HOSPITAL_COMMUNITY): Payer: BLUE CROSS/BLUE SHIELD

## 2017-02-20 ENCOUNTER — Telehealth: Payer: Self-pay | Admitting: Cardiology

## 2017-02-20 DIAGNOSIS — I5022 Chronic systolic (congestive) heart failure: Secondary | ICD-10-CM

## 2017-02-20 DIAGNOSIS — Z95810 Presence of automatic (implantable) cardiac defibrillator: Secondary | ICD-10-CM

## 2017-02-20 NOTE — Telephone Encounter (Signed)
Spoke with pt and reminded pt of remote transmission that is due today. Pt verbalized understanding.   

## 2017-02-21 ENCOUNTER — Telehealth (HOSPITAL_COMMUNITY): Payer: Self-pay

## 2017-02-21 ENCOUNTER — Ambulatory Visit (HOSPITAL_COMMUNITY)
Admission: RE | Admit: 2017-02-21 | Discharge: 2017-02-21 | Disposition: A | Discharge status: Home / Self Care | Payer: BLUE CROSS/BLUE SHIELD | Source: Ambulatory Visit | Attending: Internal Medicine | Admitting: Internal Medicine

## 2017-02-21 ENCOUNTER — Encounter (HOSPITAL_COMMUNITY): Payer: Self-pay

## 2017-02-21 VITALS — BP 140/76 | HR 84 | Wt 188.6 lb

## 2017-02-21 DIAGNOSIS — E785 Hyperlipidemia, unspecified: Secondary | ICD-10-CM | POA: Diagnosis not present

## 2017-02-21 DIAGNOSIS — I82499 Acute embolism and thrombosis of other specified deep vein of unspecified lower extremity: Secondary | ICD-10-CM

## 2017-02-21 DIAGNOSIS — Z79899 Other long term (current) drug therapy: Secondary | ICD-10-CM | POA: Diagnosis not present

## 2017-02-21 DIAGNOSIS — Z95810 Presence of automatic (implantable) cardiac defibrillator: Secondary | ICD-10-CM

## 2017-02-21 DIAGNOSIS — E1143 Type 2 diabetes mellitus with diabetic autonomic (poly)neuropathy: Secondary | ICD-10-CM | POA: Diagnosis not present

## 2017-02-21 DIAGNOSIS — Z7902 Long term (current) use of antithrombotics/antiplatelets: Secondary | ICD-10-CM | POA: Insufficient documentation

## 2017-02-21 DIAGNOSIS — Z7901 Long term (current) use of anticoagulants: Secondary | ICD-10-CM | POA: Diagnosis not present

## 2017-02-21 DIAGNOSIS — I25110 Atherosclerotic heart disease of native coronary artery with unstable angina pectoris: Secondary | ICD-10-CM

## 2017-02-21 DIAGNOSIS — I10 Essential (primary) hypertension: Secondary | ICD-10-CM

## 2017-02-21 DIAGNOSIS — K5090 Crohn's disease, unspecified, without complications: Secondary | ICD-10-CM | POA: Diagnosis not present

## 2017-02-21 DIAGNOSIS — K3184 Gastroparesis: Secondary | ICD-10-CM | POA: Insufficient documentation

## 2017-02-21 DIAGNOSIS — I252 Old myocardial infarction: Secondary | ICD-10-CM | POA: Insufficient documentation

## 2017-02-21 DIAGNOSIS — I2510 Atherosclerotic heart disease of native coronary artery without angina pectoris: Secondary | ICD-10-CM | POA: Insufficient documentation

## 2017-02-21 DIAGNOSIS — N183 Chronic kidney disease, stage 3 unspecified: Secondary | ICD-10-CM

## 2017-02-21 DIAGNOSIS — Z794 Long term (current) use of insulin: Secondary | ICD-10-CM | POA: Diagnosis not present

## 2017-02-21 DIAGNOSIS — I255 Ischemic cardiomyopathy: Secondary | ICD-10-CM | POA: Diagnosis not present

## 2017-02-21 DIAGNOSIS — I130 Hypertensive heart and chronic kidney disease with heart failure and stage 1 through stage 4 chronic kidney disease, or unspecified chronic kidney disease: Secondary | ICD-10-CM | POA: Diagnosis not present

## 2017-02-21 DIAGNOSIS — I5022 Chronic systolic (congestive) heart failure: Secondary | ICD-10-CM

## 2017-02-21 DIAGNOSIS — E1122 Type 2 diabetes mellitus with diabetic chronic kidney disease: Secondary | ICD-10-CM | POA: Diagnosis not present

## 2017-02-21 LAB — BASIC METABOLIC PANEL
Anion gap: 5 (ref 5–15)
BUN: 34 mg/dL — ABNORMAL HIGH (ref 6–20)
CALCIUM: 9 mg/dL (ref 8.9–10.3)
CO2: 21 mmol/L — ABNORMAL LOW (ref 22–32)
CREATININE: 1.46 mg/dL — AB (ref 0.61–1.24)
Chloride: 110 mmol/L (ref 101–111)
GFR calc non Af Amer: 54 mL/min — ABNORMAL LOW (ref >60)
Glucose, Bld: 228 mg/dL — ABNORMAL HIGH (ref 65–99)
Potassium: 4.8 mmol/L (ref 3.5–5.1)
SODIUM: 136 mmol/L (ref 135–145)

## 2017-02-21 NOTE — Progress Notes (Signed)
EPIC Encounter for ICM Monitoring  Patient Name: [name] is a 51 y.o. male Date: 02/21/2017 Primary Care Physican: [name], MD Primary Cardiologist: [facility] Electrophysiologist: Faustino Congress Weight: 187 lbs(Discharge weight 02/13/2017)         Patient being seen in HF clinic 6/19/20018 post hospitalization.   Hospitalization 02/08/2017 to 02/13/2017 for fluid overload with symptoms of SOB, weight gain 15 pounds in 2 weeks and swelling in his lower extremities and abdomen.   Thoracic impedance is almost at baseline today but was abnormal suggesting fluid accumulation 01/27/2017 to 02/10/2017 and above baseline suggesting dryness which correlates with hospital admission for fluid overload and then diuresed during hospitalization.  Prescribed and confirmed dosage: Torsemide 20 mg 3tablets (60 mg total)daily. Extra torsemide if weight increases by 3 pounds in one day or 5 pounds overnight per HF chart note 02/21/2017  Labs: 02/13/2017 Creatinine 2.28, BUN 41, Potassium 4.5, Sodium 134, EGFR 31-36 02/12/2017 Creatinine 2.64, BUN 40, Potassium 4.5, Sodium 133, EGFR 26-30  02/11/2017 Creatinine 2.34, BUN 33, Potassium 4.4, Sodium 136, EGFR 30-35  02/10/2017 Creatinine 1.90, BUN 26, Potassium 3.5, Sodium 138, EGFR 39-45  02/09/2017 Creatinine 1.29, BUN 20, Potassium 3.0, Sodium 138, EGFR >60  02/08/2017 Creatinine 1.22, BUN 17, Potassium 3.5, Sodium 141, EGFR >60  01/17/2017 Creatinine 2.05, BUN 42, Potassium 4.8, Sodium 136, EGFR 36-41  01/03/2017 Creatinine 1.88, BUN 39, Potassium 4.6, Sodium 139, EGFR 39-46 12/28/2016 Creatinine 1.87, BUN 35, Potassium 4.1, Sodium 140, EGFR 40-46 12/21/2016 Creatinine 1.88, BUN 37, Potassium 4.8. Sodium 139, EGFR 39-46 12/13/2016 Creatinine 1.95, BUN 75, Potassium 4.7, Sodium 137, EGFR 38-44 12/08/2016 Creatinine 1.67, BUN 70, Potassium 5.7, Sodium 138, EGFR 46-54  12/06/2016 Creatinine 2.08, BUN 84, Potassium 5.1, Sodium  135, EGFR 35-40  12/05/2016 Creatinine 2.10, BUN 85, Potassium 6.1, Sodium 131, EGFR 35-40  03/26/2018Creatinine 1.38, BUN 34, Potassium 4.8, Sodium 137, EGFR 57->60  03/25/2018Creatinine 1.51, BUN 30, Potassium 5.1, Sodium 135, EGFR 51-60  03/24/2018Creatinine 1.57, BUN 27, Potassium 5.2, Sodium 138, EGFR 49-57  03/23/2018Creatinine 1.69, BUN 34, Potassium 4.8, Sodium 134, EGFR 45-52  11/24/2016 Creatinine 1.92, BUN 37, Potassium 4.9, Sodium 134, EGFR 38-45  11/23/2016 Creatinine 1.92, BUN 41, Potassium 4.5, Sodium 130, EGFR 38-45  11/22/2016 Creatinine 2.00, BUN 44, Potassium 4.8, Sodium 130, EGFR 37-42 11/21/2016 Creatinine 2.69, BUN 53, Potassium 4.3, Sodium 133, EGFR 26-30  11/20/2016 Creatinine 2.63, BUN 51, Potassium 4.9, Sodium 133, EGFR 26-30  11/19/2016 Creatinine 1.65, BUN 38, Potassium 4.8, Sodium 131, EGFR 46-54  03/16/2018Creatinine 1.35, BUN 29, Potassium 4.2, Sodium 138, EGFR >60  03/07/2018Creatinine 1.63, BUN 37, Potassium 4.7, Sodium 137  01/03/2018Creatinine 1.47, BUN 34, Potassium 4.1, Sodium 140, EGFR 54-63   Recommendations: None  Follow-up plan: ICM clinic phone appointment on 02/28/2017 to recheck fluid levels.  Office appointment scheduled on 02/21/2017 with Dr Aundra Dubin.  Copy of ICM check sent to primary cardiologist and device physician.   3 month ICM trend: 02/21/2017   1 Year ICM trend:      Rosalene Billings, RN 02/21/2017 10:37 AM
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
## 2017-02-21 NOTE — Telephone Encounter (Signed)
CHF Clinic appointment reminder call placed to patient for upcoming post-hospital follow up.  Does understand purpose of this appointment and where CHF Clinic is located? Pt confirmed apt date/time/location  How is patient feeling? Well, no complaints  Does patient have all of their medications since their recent discharge? Yes  Patient also reminded to take all medications as prescribed on the day of his/her appointment and to bring all medications to this appointment.  Advised to call our office for tardiness or cancellations/rescheduling needs.  Leory Plowman, Guinevere Ferrari

## 2017-02-21 NOTE — Patient Instructions (Signed)
Routine lab work today. Will notify you of abnormal results, otherwise no news is good news!  No changes to medication at this time.  Follow up 2 months. Take all medication as prescribed the day of your appointment. Bring all medications with you to your appointment.  Do the following things EVERYDAY: 1) Weigh yourself in the morning before breakfast. Write it down and keep it in a log. 2) Take your medicines as prescribed 3) Eat low salt foods-Limit salt (sodium) to 2000 mg per day.  4) Stay as active as you can everyday 5) Limit all fluids for the day to less than 2 liters

## 2017-02-22 ENCOUNTER — Other Ambulatory Visit: Payer: Self-pay | Admitting: Internal Medicine

## 2017-02-22 ENCOUNTER — Encounter (HOSPITAL_COMMUNITY): Payer: BLUE CROSS/BLUE SHIELD

## 2017-02-22 NOTE — Progress Notes (Signed)
Cardiac Individual Treatment Plan  Patient Details  Name: [name] MRN: [id number] Date of Birth: Oct 08, 1965 Referring Provider:     [place] from 01/17/2017 in [place]  Referring Provider  Candee Furbish, MD.      Initial Encounter Date:    CARDIAC REHAB PHASE II ORIENTATION from 01/17/2017 in [place]  Date  01/17/17  Referring Provider  Candee Furbish, MD.      Visit Diagnosis: 11/25/16 Stented coronary artery  Heart failure, chronic systolic ([place])  Patient's Home Medications on Admission:  Current Outpatient Prescriptions:  .  atorvastatin (LIPITOR) 80 MG tablet, Take 1 tablet (80 mg total) by mouth daily at 6 PM., Disp: 30 tablet, Rfl: 6 .  carvedilol (COREG) 6.25 MG tablet, Take 0.5 tablets (3.125 mg total) by mouth 2 (two) times daily., Disp: 60 tablet, Rfl: 0 .  clopidogrel (PLAVIX) 75 MG tablet, Take 1 tablet (75 mg total) by mouth daily., Disp: 30 tablet, Rfl: 6 .  digoxin (LANOXIN) 0.125 MG tablet, Take 1 tablet (0.125 mg total) by mouth daily., Disp: 90 tablet, Rfl: 3 .  hydrALAZINE (APRESOLINE) 25 MG tablet, Take 1 tablet (25 mg total) by mouth 3 (three) times daily., Disp: 90 tablet, Rfl: 6 .  insulin glargine (LANTUS) 100 UNIT/ML injection, Inject 0.28 mLs (28 Units total) into the skin daily., Disp: 10 mL, Rfl: 11 .  isosorbide mononitrate (IMDUR) 60 MG 24 hr tablet, Take 1.5 tablets (90 mg total) by mouth daily. (Patient taking differently: Take 60 mg by mouth daily. ), Disp: 45 tablet, Rfl: 6 .  metoCLOPramide (REGLAN) 5 MG tablet, Take one tablet 1/2 hr before meals. (Patient taking differently: Take 5 mg by mouth 3 (three) times daily as needed for nausea or vomiting. ), Disp: 90 tablet, Rfl: 2 .  nitroGLYCERIN (NITROSTAT) 0.4 MG SL tablet, Place 1 tablet (0.4 mg total) under the tongue every 5 (five) minutes as needed for chest pain. (Patient not taking: Reported on  02/21/2017), Disp: 25 tablet, Rfl: 3 .  ondansetron (ZOFRAN ODT) 4 MG disintegrating tablet, Take 1 tablet (4 mg total) by mouth every 6 (six) hours as needed for nausea or vomiting., Disp: 40 tablet, Rfl: 1 .  ranolazine (RANEXA) 500 MG 12 hr tablet, Take 1 tablet (500 mg total) by mouth 2 (two) times daily., Disp: 60 tablet, Rfl: 3 .  torsemide (DEMADEX) 20 MG tablet, Take 3 tablets (60 mg total) by mouth daily., Disp: 90 tablet, Rfl: 3 .  warfarin (COUMADIN) 5 MG tablet, Take as directed by coumadin clinic (Patient taking differently: Take 5-7.5 mg by mouth See admin instructions. Take 7.5 mg by mouth on Monday and Saturday. Take 5 mg by mouth on all other days), Disp: 50 tablet, Rfl: 1  Past Medical History: Past Medical History:  Diagnosis Date  . Angina   . ASCVD (arteriosclerotic cardiovascular disease)    , Anterior infarction 2005, LAD diagonal bifurcation intervention 03/2004  . Automatic implantable cardiac defibrillator -[employer]       . Benign neoplasm of colon   . CHF (congestive heart failure) ([place])   . Chronic systolic heart failure ([place])   . Coronary artery disease     Widely patent previously placed stents in the left anterior   . Crohn's disease ([place])   . 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 Dyspnea   . Gastroparesis   . GERD (gastroesophageal reflux disease)   .  High cholesterol   . Hyperlipidemia   . Hypersomnolent    Previous diagnosis of narcolepsy  . Hypertension, essential   . Ischemic cardiomyopathy    Ejection fraction 15-20% catheterization 2010  . Type II or unspecified type diabetes mellitus without mention of complication, not stated as uncontrolled   . Unspecified gastritis and gastroduodenitis without mention of hemorrhage     Tobacco Use: History  Smoking Status  . Never Smoker  Smokeless Tobacco  . Never Used    Labs: Recent Review Flowsheet Data    Labs for ITP Cardiac and Pulmonary Rehab Latest Ref Rng & Units  11/28/2016 02/08/2017 02/09/2017 02/10/2017 02/11/2017   Cholestrol 0 - 200 mg/dL - - - - -   LDLCALC 0 - 99 mg/dL - - - - -   HDL >40 mg/dL - - - - -   Trlycerides <150 mg/dL - - - - -   Hemoglobin A1c 4.8 - 5.6 % - - 7.5(H) - -   HCO3 20.0 - 28.0 mmol/L - - - - -   TCO2 0 - 100 mmol/L - - - - -   O2SAT % 71.0 80.4 73.6 58.9 70.3      Capillary Blood Glucose: Lab Results  Component Value Date   GLUCAP 182 (H) 02/17/2017   GLUCAP 109 (H) 02/15/2017   GLUCAP 72 02/13/2017   GLUCAP 150 (H) 02/12/2017   GLUCAP 142 (H) 02/12/2017     Exercise Target Goals:    Exercise Program Goal: Individual exercise prescription set with THRR, safety & activity barriers. Participant demonstrates ability to understand and report RPE using BORG scale, to self-measure pulse accurately, and to acknowledge the importance of the exercise prescription.  Exercise Prescription Goal: Starting with aerobic activity 30 plus minutes a day, 3 days per week for initial exercise prescription. Provide home exercise prescription and guidelines that participant acknowledges understanding prior to discharge.  Activity Barriers & Risk Stratification:     Activity Barriers & Cardiac Risk Stratification - 01/17/17 0903      Activity Barriers & Cardiac Risk Stratification   Activity Barriers Deconditioning;Muscular Weakness;Shortness of Breath   Cardiac Risk Stratification High      6 Minute Walk:     6 Minute Walk    Row Name 01/17/17 1011         6 Minute Walk   Phase Initial     Distance 965 feet     Walk Time 6 minutes     # of Rest Breaks 0     MPH 1.83     METS 3.03     RPE 10     VO2 Peak 10.6     Symptoms No     Resting HR 74 bpm     Resting BP 114/60     Max Ex. HR 84 bpm     Max Ex. BP 122/80     2 Minute Post BP 120/70        Oxygen Initial Assessment:   Oxygen Re-Evaluation:   Oxygen Discharge (Final Oxygen Re-Evaluation):   Initial Exercise Prescription:     Initial Exercise  Prescription - 01/17/17 1000      Date of Initial Exercise RX and Referring Provider   Date 01/17/17   Referring Provider Candee Furbish, MD.     Treadmill   MPH 1.8   Grade 0   Minutes 10   METs 2.38     Bike   Level 0.5   Minutes 10  METs 2.11     NuStep   Level 2   SPM 80   Minutes 10   METs 2     Prescription Details   Frequency (times per week) 3   Duration Progress to 30 minutes of continuous aerobic without signs/symptoms of physical distress     Intensity   THRR 40-80% of Max Heartrate 67-134   Ratings of Perceived Exertion 11-13   Perceived Dyspnea 0-4     Progression   Progression Continue to progress workloads to maintain intensity without signs/symptoms of physical distress.     Resistance Training   Training Prescription Yes   Weight 2lbs   Reps 10-15      Perform Capillary Blood Glucose checks as needed.  Exercise Prescription Changes:     Exercise Prescription Changes    Row Name 01/25/17 1400 02/07/17 1200           Response to Exercise   Blood Pressure (Admit) 122/78 120/70      Blood Pressure (Exercise) 142/78 126/72      Blood Pressure (Exit) 124/68 112/72      Heart Rate (Admit) 79 bpm 85 bpm      Heart Rate (Exercise) 100 bpm 113 bpm      Heart Rate (Exit) 79 bpm 85 bpm      Rating of Perceived Exertion (Exercise) 13 12      Comments fluid index elevated fluid index elevated      Duration Continue with 30 min of aerobic exercise without signs/symptoms of physical distress. Continue with 30 min of aerobic exercise without signs/symptoms of physical distress.      Intensity THRR unchanged THRR unchanged        Progression   Progression Continue to progress workloads to maintain intensity without signs/symptoms of physical distress. Continue to progress workloads to maintain intensity without signs/symptoms of physical distress.      Average METs 2.1 2.5        Resistance Training   Training Prescription Yes Yes      Weight 2lbs  2lbs      Reps 10-15 10-15      Time 10 Minutes 10 Minutes        Treadmill   MPH 1.8  -      Grade 0  -      Minutes 10  -      METs 2.38  -        Bike   Level 0.5 1      Minutes 10 10      METs 2.11 3.2        NuStep   Level 2 2      SPM 80 80      Minutes 10 10      METs 1.7 1.9        Track   Laps  - 8      Minutes  - 10      METs  - 2.39         Exercise Comments:     Exercise Comments    Row Name 01/25/17 1451 02/15/17 1643         Exercise Comments Pt was oriented to exercise equipment today. Pt responded well to exercise session will f/u Reviewed home exercise guidelines with patient. Patient plans to start walking 10 minutes, 3x's/day as his mode of home exercise. Exercise limited by PAD pain.         Exercise Goals and Review:  Exercise Goals    Row Name 01/17/17 0905             Exercise Goals   Increase Physical Activity Yes       Intervention Provide advice, education, support and counseling about physical activity/exercise needs.;Develop an individualized exercise prescription for aerobic and resistive training based on initial evaluation findings, risk stratification, comorbidities and participant's personal goals.       Expected Outcomes Achievement of increased cardiorespiratory fitness and enhanced flexibility, muscular endurance and strength shown through measurements of functional capacity and personal statement of participant.       Increase Strength and Stamina Yes  Improve EF and be able to perform activities without fatigue and exhaustion       Intervention Provide advice, education, support and counseling about physical activity/exercise needs.;Develop an individualized exercise prescription for aerobic and resistive training based on initial evaluation findings, risk stratification, comorbidities and participant's personal goals.       Expected Outcomes Achievement of increased cardiorespiratory fitness and enhanced flexibility,  muscular endurance and strength shown through measurements of functional capacity and personal statement of participant.          Exercise Goals Re-Evaluation :     Exercise Goals Re-Evaluation    Row Name 02/21/17 1612             Exercise Goal Re-Evaluation   Exercise Goals Review Increase Physical Activity;Increase Strenth and Stamina       Comments Pt is improving in cardiorespiratory fitness and MET levels. Pt nustep MET avg increased from 1.7 to 2.1       Expected Outcomes Pt will continue to improve in cardiorespiratory fitness            Discharge Exercise Prescription (Final Exercise Prescription Changes):     Exercise Prescription Changes - 02/07/17 1200      Response to Exercise   Blood Pressure (Admit) 120/70   Blood Pressure (Exercise) 126/72   Blood Pressure (Exit) 112/72   Heart Rate (Admit) 85 bpm   Heart Rate (Exercise) 113 bpm   Heart Rate (Exit) 85 bpm   Rating of Perceived Exertion (Exercise) 12   Comments fluid index elevated   Duration Continue with 30 min of aerobic exercise without signs/symptoms of physical distress.   Intensity THRR unchanged     Progression   Progression Continue to progress workloads to maintain intensity without signs/symptoms of physical distress.   Average METs 2.5     Resistance Training   Training Prescription Yes   Weight 2lbs   Reps 10-15   Time 10 Minutes     Bike   Level 1   Minutes 10   METs 3.2     NuStep   Level 2   SPM 80   Minutes 10   METs 1.9     Track   Laps 8   Minutes 10   METs 2.39      Nutrition:  Target Goals: Understanding of nutrition guidelines, daily intake of sodium <1517m, cholesterol <2077m calories 30% from fat and 7% or less from saturated fats, daily to have 5 or more servings of fruits and vegetables.  Biometrics:     Pre Biometrics - 01/17/17 1017      Pre Biometrics   Height 5' 8"  (1.727 m)   Weight 189 lb 2.5 oz (85.8 kg)   Waist Circumference 39.75 inches    Hip Circumference 43 inches   Waist to Hip Ratio 0.92 %   BMI (Calculated)  28.8   Triceps Skinfold 26 mm   % Body Fat 29.6 %   Grip Strength 31 kg   Flexibility 7.5 in   Single Leg Stand 2.56 seconds       Nutrition Therapy Plan and Nutrition Goals:     Nutrition Therapy & Goals - 01/31/17 1451      Nutrition Therapy   Diet Therapeutic Lifestyle Changes, Carb Modified   Drug/Food Interactions Coumadin/Vit K     Personal Nutrition Goals   Nutrition Goal Wt loss of 1-2 lb/week to a wt loss goal of 6-24 lb at graduation from [facility] Goal #2 Improved glycemic control as evidenced by pt's A1c decrease toward 7 or less.      Intervention Plan   Intervention Prescribe, educate and counsel regarding individualized specific dietary modifications aiming towards targeted core components such as weight, hypertension, lipid management, diabetes, heart failure and other comorbidities.   Expected Outcomes Short Term Goal: Understand basic principles of dietary content, such as calories, fat, sodium, cholesterol and nutrients.;Long Term Goal: Adherence to prescribed nutrition plan.      Nutrition Discharge: Nutrition Scores:     Nutrition Assessments - 01/31/17 1451      MEDFICTS Scores   Pre Score 23      Nutrition Goals Re-Evaluation:   Nutrition Goals Re-Evaluation:   Nutrition Goals Discharge (Final Nutrition Goals Re-Evaluation):   Psychosocial: Target Goals: Acknowledge presence or absence of significant depression and/or stress, maximize coping skills, provide positive support system. Participant is able to verbalize types and ability to use techniques and skills needed for reducing stress and depression.  Initial Review & Psychosocial Screening:     Initial Psych Review & Screening - 01/17/17 0844      Initial Review   Current issues with None Identified     Family Dynamics   Good Support System? Yes  family, friends    Comments upon brief  assessment, no psychosocial needs identified, no interventions necessary      Barriers   Psychosocial barriers to participate in program There are no identifiable barriers or psychosocial needs.     Screening Interventions   Interventions Encouraged to exercise;Provide feedback about the scores to participant      Quality of Life Scores:     Quality of Life - 01/17/17 0957      Quality of Life Scores   Health/Function Pre 13.3 %   Socioeconomic Pre 18.13 %   Psych/Spiritual Pre 17.14 %   Family Pre 19.9 %   GLOBAL Pre 16.11 %      PHQ-9: Recent Review Flowsheet Data    Depression screen [facility] 2/9 01/25/2017 01/17/2017 11/10/2016 11/09/2016   Decreased Interest 0 0 0 0   Down, Depressed, Hopeless 1 0 0 0   PHQ - 2 Score 1 0 0 0     Interpretation of Total Score  Total Score Depression Severity:  1-4 = Minimal depression, 5-9 = Mild depression, 10-14 = Moderate depression, 15-19 = Moderately severe depression, 20-27 = Severe depression   Psychosocial Evaluation and Intervention:     Psychosocial Evaluation - 02/21/17 1328      Psychosocial Evaluation & Interventions   Interventions Stress management education;Relaxation education;Encouraged to exercise with the program and follow exercise prescription   Comments pt guarded with responses but feels supported by family   Continue Psychosocial Services  Follow up required by staff      Psychosocial Re-Evaluation:     Psychosocial Re-Evaluation  [place] Name 02/21/17 1329 02/21/17 1354           Psychosocial Re-Evaluation   Current issues with (P)  Current Stress Concerns;Current Anxiety/Panic Current Stress Concerns;Current Anxiety/Panic      Comments (P)  chronic illness - chf chronic illness      Interventions (P)  Relaxation education;Stress management education;Encouraged to attend Cardiac Rehabilitation for the exercise Relaxation education;Stress management education;Encouraged to attend Cardiac Rehabilitation for  the exercise      Continue Psychosocial Services   - Follow up required by staff        Initial Review   Source of Stress Concerns  - Chronic Illness         Psychosocial Discharge (Final Psychosocial Re-Evaluation):     Psychosocial Re-Evaluation - 02/21/17 1354      Psychosocial Re-Evaluation   Current issues with Current Stress Concerns;Current Anxiety/Panic   Comments chronic illness   Interventions Relaxation education;Stress management education;Encouraged to attend Cardiac Rehabilitation for the exercise   Continue Psychosocial Services  Follow up required by staff     Initial Review   Source of Stress Concerns Chronic Illness      Vocational Rehabilitation: Provide vocational rehab assistance to qualifying candidates.   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 Participant will state understanding/return demonstration of topics presented.  Learning Barriers/Preferences:     Learning Barriers/Preferences - 01/17/17 0843      Learning Barriers/Preferences   Learning Barriers Sight   Learning Preferences Written Material      Education Topics: Count Your Pulse:  -Group instruction provided by verbal instruction, demonstration, patient participation and written materials to support subject.  Instructors address importance of being able to find your pulse and how to count your pulse when at home without a heart monitor.  Patients get hands on experience counting their pulse with staff help and individually.   CARDIAC REHAB PHASE II EXERCISE from 02/03/2017 in [place]  Date  02/03/17   Instruction Review Code  2- meets goals/outcomes      Heart Attack, Angina, and Risk Factor Modification:  -Group instruction provided by verbal instruction, video, and written materials to support subject.  Instructors address signs and symptoms of angina and heart attacks.    Also discuss risk factors for heart disease and how to make changes to improve heart health risk factors.   Functional Fitness:  -Group instruction provided by verbal instruction, demonstration, patient participation, and written materials to support subject.  Instructors address safety measures for doing things around the house.  Discuss how to get up and down off the floor, how to pick things up properly, how to safely get out of a chair without assistance, and balance training.   Meditation and Mindfulness:  -Group instruction provided by verbal instruction, patient participation, and written materials to support subject.  Instructor addresses importance of mindfulness and meditation practice to help reduce stress and improve awareness.  Instructor also leads participants through a meditation exercise.    Stretching for Flexibility and Mobility:  -Group instruction provided by verbal instruction, patient participation, and written materials to support subject.  Instructors  lead participants through series of stretches that are designed to increase flexibility thus improving mobility.  These stretches are additional exercise for major muscle groups that are typically performed during regular warm up and cool down.   CARDIAC REHAB PHASE II EXERCISE from 02/03/2017 in [place]  Date  01/27/17  Instruction Review Code  2- meets goals/outcomes      Hands Only CPR:  -Group verbal, video, and participation provides a basic overview of AHA guidelines for community CPR. Role-play of emergencies allow participants the opportunity to practice calling for help and chest compression technique  with discussion of AED use.   Hypertension: -Group verbal and written instruction that provides a basic overview of hypertension including the most recent diagnostic guidelines, risk factor reduction with self-care instructions and medication management.    Nutrition I class: Heart Healthy Eating:  -Group instruction provided by PowerPoint slides, verbal discussion, and written materials to support subject matter. The instructor gives an explanation and review of the Therapeutic Lifestyle Changes diet recommendations, which includes a discussion on lipid goals, dietary fat, sodium, fiber, plant stanol/sterol esters, sugar, and the components of a well-balanced, healthy diet.   Nutrition II class: Lifestyle Skills:  -Group instruction provided by PowerPoint slides, verbal discussion, and written materials to support subject matter. The instructor gives an explanation and review of label reading, grocery shopping for heart health, heart healthy recipe modifications, and ways to make healthier choices when eating out.   Diabetes Question & Answer:  -Group instruction provided by PowerPoint slides, verbal discussion, and written materials to support subject matter. The instructor gives an explanation and review of diabetes co-morbidities, pre- and post-prandial blood glucose goals, pre-exercise blood glucose goals, signs, symptoms, and treatment of hypoglycemia and hyperglycemia, and foot care basics.   Diabetes Blitz:  -Group instruction provided by PowerPoint slides, verbal discussion, and written materials to support subject matter. The instructor gives an explanation and review of the physiology behind type 1 and type 2 diabetes, diabetes medications and rational behind using different medications, pre- and post-prandial blood glucose recommendations and Hemoglobin A1c goals, diabetes diet, and exercise including blood glucose guidelines for exercising safely.    Portion Distortion:  -Group  instruction provided by PowerPoint slides, verbal discussion, written materials, and food models to support subject matter. The instructor gives an explanation of serving size versus portion size, changes in portions sizes over the last 20 years, and what consists of a serving from each food group.   Stress Management:  -Group instruction provided by verbal instruction, video, and written materials to support subject matter.  Instructors review role of stress in heart disease and how to cope with stress positively.     Exercising on Your Own:  -Group instruction provided by verbal instruction, power point, and written materials to support subject.  Instructors discuss benefits of exercise, components of exercise, frequency and intensity of exercise, and end points for exercise.  Also discuss use of nitroglycerin and activating EMS.  Review options of places to exercise outside of rehab.  Review guidelines for sex with heart disease.   Cardiac Drugs I:  -Group instruction provided by verbal instruction and written materials to support subject.  Instructor reviews cardiac drug classes: antiplatelets, anticoagulants, beta blockers, and statins.  Instructor discusses reasons, side effects, and lifestyle considerations for each drug class.   Cardiac Drugs II:  -Group instruction provided by verbal instruction and written materials to support subject.  Instructor reviews cardiac drug classes: angiotensin converting  enzyme inhibitors (ACE-I), angiotensin II receptor blockers (ARBs), nitrates, and calcium channel blockers.  Instructor discusses reasons, side effects, and lifestyle considerations for each drug class.   Anatomy and Physiology of the Circulatory System:  Group verbal and written instruction and models provide basic cardiac anatomy and physiology, with the coronary electrical and arterial systems. Review of: AMI, Angina, Valve disease, Heart Failure, Peripheral Artery Disease, Cardiac  Arrhythmia, Pacemakers, and the ICD.   CARDIAC REHAB PHASE II EXERCISE from 02/03/2017 in [place]  Date  02/01/17  Instruction Review Code  2- meets goals/outcomes      Other Education:  -Group or individual verbal, written, or video instructions that support the educational goals of the cardiac rehab program.   Knowledge Questionnaire Score:     Knowledge Questionnaire Score - 01/17/17 0957      Knowledge Questionnaire Score   Pre Score 23/24      Core Components/Risk Factors/Patient Goals at Admission:     Personal Goals and Risk Factors at Admission - 01/17/17 1125      Core Components/Risk Factors/Patient Goals on Admission   Diabetes Yes   Intervention Provide education about signs/symptoms and action to take for hypo/hyperglycemia.;Provide education about proper nutrition, including hydration, and aerobic/resistive exercise prescription along with prescribed medications to achieve blood glucose in normal ranges: Fasting glucose 65-99 mg/dL   Expected Outcomes Short Term: Participant verbalizes understanding of the signs/symptoms and immediate care of hyper/hypoglycemia, proper foot care and importance of medication, aerobic/resistive exercise and nutrition plan for blood glucose control.;Long Term: Attainment of HbA1C < 7%.   Heart Failure Yes   Intervention Provide a combined exercise and nutrition program that is supplemented with education, support and counseling about heart failure. Directed toward relieving symptoms such as shortness of breath, decreased exercise tolerance, and extremity edema.   Expected Outcomes Improve functional capacity of life;Short term: Attendance in program 2-3 days a week with increased exercise capacity. Reported lower sodium intake. Reported increased fruit and vegetable intake. Reports medication compliance.;Short term: Daily weights obtained and reported for increase. Utilizing diuretic protocols set by  physician.;Long term: Adoption of self-care skills and reduction of barriers for early signs and symptoms recognition and intervention leading to self-care maintenance.   Hypertension Yes   Intervention Provide education on lifestyle modifcations including regular physical activity/exercise, weight management, moderate sodium restriction and increased consumption of fresh fruit, vegetables, and low fat dairy, alcohol moderation, and smoking cessation.;Monitor prescription use compliance.   Expected Outcomes Short Term: Continued assessment and intervention until BP is < 140/75m HG in hypertensive participants. < 130/866mHG in hypertensive participants with diabetes, heart failure or chronic kidney disease.;Long Term: Maintenance of blood pressure at goal levels.   Lipids Yes   Intervention Provide education and support for participant on nutrition & aerobic/resistive exercise along with prescribed medications to achieve LDL <7065mHDL >12m19m Expected Outcomes Short Term: Participant states understanding of desired cholesterol values and is compliant with medications prescribed. Participant is following exercise prescription and nutrition guidelines.;Long Term: Cholesterol controlled with medications as prescribed, with individualized exercise RX and with personalized nutrition plan. Value goals: LDL < 70mg70mL > 40 mg.      Core Components/Risk Factors/Patient Goals Review:      Goals and Risk Factor Review    Row Name 01/26/17 1257 02/21/17 1327           Core Components/Risk Factors/Patient Goals Review   Personal Goals Review Heart Failure;Lipids;Diabetes;Hypertension;Improve shortness of breath with  ADL's Heart Failure;Lipids;Diabetes;Hypertension;Improve shortness of breath with ADL's      Review Pt has completed 2 exercise sessions, will continue to monitor. slow progress due to pt progressive nature of the disease process         Core Components/Risk Factors/Patient Goals at  Discharge (Final Review):      Goals and Risk Factor Review - 02/21/17 1327      Core Components/Risk Factors/Patient Goals Review   Personal Goals Review Heart Failure;Lipids;Diabetes;Hypertension;Improve shortness of breath with ADL's   Review slow progress due to pt progressive nature of the disease process      ITP Comments:     ITP Comments    Row Name 01/17/17 0820           ITP Comments Dr. Fransico Him, Medical Director           Comments:  Pt is making expected progress toward personal goals after completing 7 sessions.Pyschosocial Assessment-Pt with stress and anxiety regarding his chronic illness.  Pt recently hospitalized for tune up.  Pt struggles to attend CR.  Advised pt to aim for 2 x week until he can build up his stamina.  Pt remains guarded with his response but rehab staff are working hard to establish rapport.  Recommend continued exercise and life style modification education including  stress management and relaxation techniques to decrease cardiac risk profile. Cherre Huger, BSN Cardiac and Training and development officer

## 2017-02-24 ENCOUNTER — Encounter (HOSPITAL_COMMUNITY): Admission: RE | Admit: 2017-02-24 | Payer: BLUE CROSS/BLUE SHIELD | Source: Ambulatory Visit

## 2017-02-27 ENCOUNTER — Encounter (HOSPITAL_COMMUNITY): Payer: BLUE CROSS/BLUE SHIELD

## 2017-02-28 ENCOUNTER — Telehealth: Payer: Self-pay | Admitting: Cardiology

## 2017-02-28 ENCOUNTER — Ambulatory Visit (INDEPENDENT_AMBULATORY_CARE_PROVIDER_SITE_OTHER): Payer: BLUE CROSS/BLUE SHIELD

## 2017-02-28 DIAGNOSIS — I5022 Chronic systolic (congestive) heart failure: Secondary | ICD-10-CM | POA: Diagnosis not present

## 2017-02-28 DIAGNOSIS — Z95810 Presence of automatic (implantable) cardiac defibrillator: Secondary | ICD-10-CM | POA: Diagnosis not present

## 2017-02-28 NOTE — Telephone Encounter (Signed)
Spoke with pt and reminded pt of remote transmission that is due today. Pt verbalized understanding.   

## 2017-03-01 ENCOUNTER — Ambulatory Visit: Payer: BLUE CROSS/BLUE SHIELD | Admitting: Gastroenterology

## 2017-03-01 ENCOUNTER — Encounter (HOSPITAL_COMMUNITY): Payer: BLUE CROSS/BLUE SHIELD

## 2017-03-02 ENCOUNTER — Inpatient Hospital Stay (HOSPITAL_COMMUNITY): Admission: RE | Admit: 2017-03-02 | Payer: BLUE CROSS/BLUE SHIELD | Source: Ambulatory Visit

## 2017-03-02 NOTE — Progress Notes (Signed)
EPIC Encounter for ICM Monitoring  Patient Name: [name] is a 51 y.o. male Date: 03/02/2017 Primary Care Physican: [name], MD Primary Cardiologist: [facility] Electrophysiologist: Caryl Comes Dry Weight:  [name] of last weight      Heart Failure questions reviewed, pt symptomatic with leg swelling and left leg is swelling more than right    Thoracic impedance abnormal suggesting fluid accumulation since 02/21/2017.  Prescribed dosage: Torsemide 20 mg 3tablets (60 mg total)daily. Extra torsemide if weight increases by 3 pounds in one day or 5 pounds overnight per HF chart note 02/21/2017  Recommendations: Advised to follow instructions at last HF appointment and take 1 extra Torsemide tablet x 2 days and then return to prescribed dosage.  Advised to limit salt intake to 2000 mg/day.  Follow-up plan: ICM clinic phone appointment on 03/06/2017.    Copy of ICM check sent to Dr Aundra Dubin and Dr Caryl Comes and advised if physician has further recommendations will call him back.   3 month ICM trend: 03/02/2017   1 Year ICM trend:      [name] Billings, RN 03/02/2017 2:58 PM

## 2017-03-03 ENCOUNTER — Encounter (HOSPITAL_COMMUNITY)
Admission: RE | Admit: 2017-03-03 | Discharge: 2017-03-03 | Disposition: A | Discharge status: Home / Self Care | Payer: BLUE CROSS/BLUE SHIELD | Source: Ambulatory Visit | Attending: Cardiology | Admitting: Cardiology

## 2017-03-05 NOTE — Progress Notes (Signed)
Increase torsemide to 80 mg daily with BMET in 5 days.

## 2017-03-06 ENCOUNTER — Ambulatory Visit (INDEPENDENT_AMBULATORY_CARE_PROVIDER_SITE_OTHER): Payer: BLUE CROSS/BLUE SHIELD | Admitting: Internal Medicine

## 2017-03-06 ENCOUNTER — Encounter: Payer: Self-pay | Admitting: Internal Medicine

## 2017-03-06 ENCOUNTER — Encounter (HOSPITAL_COMMUNITY): Payer: BLUE CROSS/BLUE SHIELD

## 2017-03-06 VITALS — BP 132/78 | HR 90 | Temp 98.3°F | Ht 68.0 in | Wt 194.0 lb

## 2017-03-06 DIAGNOSIS — Z79899 Other long term (current) drug therapy: Secondary | ICD-10-CM

## 2017-03-06 DIAGNOSIS — I5022 Chronic systolic (congestive) heart failure: Secondary | ICD-10-CM

## 2017-03-06 DIAGNOSIS — E118 Type 2 diabetes mellitus with unspecified complications: Secondary | ICD-10-CM | POA: Diagnosis not present

## 2017-03-06 DIAGNOSIS — Z794 Long term (current) use of insulin: Secondary | ICD-10-CM | POA: Diagnosis not present

## 2017-03-06 LAB — POCT UA - MICROALBUMIN
Creatinine, POC: 200 mg/dL
Microalbumin Ur, POC: 150 mg/L

## 2017-03-06 MED ORDER — INSULIN GLARGINE 100 UNIT/ML ~~LOC~~ SOLN: 37.0000 [IU] | Freq: Every day | SUBCUTANEOUS | 11 refills | Status: DC

## 2017-03-06 MED ORDER — GABAPENTIN 300 MG PO CAPS: 300.0000 mg | ORAL_CAPSULE | Freq: Every day | ORAL | 1 refills | Status: DC

## 2017-03-06 MED ORDER — TORSEMIDE 20 MG PO TABS: ORAL_TABLET | ORAL | 3 refills | Status: DC

## 2017-03-06 NOTE — Progress Notes (Addendum)
Call to patient and advised Dr Aundra Dubin has increased Torsemide to 20 mg 4 tablets (80 mg total) daily.  He verbalized understanding and has enough supply on hand for increased dosage.   Advised BMET needed in 5 days.  Requested he send remote transmission today for review and he will send later this afternoon.

## 2017-03-06 NOTE — Patient Instructions (Addendum)
[name],  I recommend increasing lantus to 37 units before trying mealtime insulin. Your fasting (morning) blood sugar goal is between 110-120. You can continue to increase lantus by 1 U at a time if your levels are still in 130s/140s, but I would not increase past 40 U daily before seeing me back.  Try gabapentin 300 mg at night for foot pain. If you have significant drowsiness, please stop.   Please follow-up in about 1 month.  I will refer you to ophthalmology and will send your basic metabolic panel results to the heart failure team.  Best, Dr. Ola Spurr

## 2017-03-06 NOTE — Addendum Note (Signed)
Addended by: Rosalene Billings on: 03/06/2017 12:36 PM   Modules accepted: Orders

## 2017-03-07 LAB — BASIC METABOLIC PANEL
BUN/Creatinine Ratio: 17 (ref 9–20)
BUN: 23 mg/dL (ref 6–24)
CO2: 19 mmol/L — AB (ref 20–29)
CREATININE: 1.34 mg/dL — AB (ref 0.76–1.27)
Calcium: 8.7 mg/dL (ref 8.7–10.2)
Chloride: 105 mmol/L (ref 96–106)
GFR calc Af Amer: 70 mL/min/{1.73_m2} (ref >59)
GFR, EST NON AFRICAN AMERICAN: 60 mL/min/{1.73_m2} (ref >59)
GLUCOSE: 141 mg/dL — AB (ref 65–99)
Potassium: 4.2 mmol/L (ref 3.5–5.2)
Sodium: 140 mmol/L (ref 134–144)

## 2017-03-08 DIAGNOSIS — E118 Type 2 diabetes mellitus with unspecified complications: Secondary | ICD-10-CM | POA: Insufficient documentation

## 2017-03-08 DIAGNOSIS — Z794 Long term (current) use of insulin: Secondary | ICD-10-CM | POA: Insufficient documentation

## 2017-03-08 NOTE — Assessment & Plan Note (Signed)
-   Patient with 5 lb weight gain since last OV with HF clinic 6/19 but without dyspnea, crackles on lung exam or LE edema - Patient to contact CHF clinic and send ICD transmission for instruction on whether to increase diuretic dosing - Will obtain BMP to follow-up AKI

## 2017-03-08 NOTE — Assessment & Plan Note (Addendum)
-   Recommending continuing basal insulin regimen with fasting CBG goal of 110/120. Increase lantus to 37 U from 35 U. Counseled patient to increase by 1 U prn up to 40 U towards fasting CBG goal. - Would consider mealtime insulin if still having spikes in the afternoon and once achieved better fasting control  - Will have patient trial gabapentin 300 QHS for peripheral neuropathy. Reviewed possible side effects, namely sedation.  - Obtain urine [id number] ratio - Inquire with CHF clinic about patient's vaccinations - Will refer to Ophthalmology
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
## 2017-03-09 ENCOUNTER — Telehealth: Payer: Self-pay | Admitting: Internal Medicine

## 2017-03-09 NOTE — Telephone Encounter (Signed)
Spoke with patient about his lab results. SCr much improved. He does have proteinuria. ACEi previously stopped due to AKI. Will plan to restart ACEi once patient has settled on a diuretic regimen and has another BMP.  [name], MD [place], PGY-3

## 2017-03-10 ENCOUNTER — Encounter (HOSPITAL_COMMUNITY): Payer: BLUE CROSS/BLUE SHIELD

## 2017-03-10 ENCOUNTER — Ambulatory Visit (HOSPITAL_COMMUNITY)
Admission: RE | Admit: 2017-03-10 | Discharge: 2017-03-10 | Disposition: A | Discharge status: Home / Self Care | Payer: BLUE CROSS/BLUE SHIELD | Source: Ambulatory Visit | Attending: Cardiology | Admitting: Cardiology

## 2017-03-10 DIAGNOSIS — I5022 Chronic systolic (congestive) heart failure: Secondary | ICD-10-CM | POA: Diagnosis not present

## 2017-03-10 DIAGNOSIS — Z95810 Presence of automatic (implantable) cardiac defibrillator: Secondary | ICD-10-CM | POA: Diagnosis not present

## 2017-03-10 LAB — BASIC METABOLIC PANEL
Anion gap: 7 (ref 5–15)
BUN: 21 mg/dL — ABNORMAL HIGH (ref 6–20)
CALCIUM: 8.5 mg/dL — AB (ref 8.9–10.3)
CO2: 24 mmol/L (ref 22–32)
Chloride: 107 mmol/L (ref 101–111)
Creatinine, Ser: 1.43 mg/dL — ABNORMAL HIGH (ref 0.61–1.24)
GFR calc Af Amer: >60 mL/min (ref >60)
GFR, EST NON AFRICAN AMERICAN: 55 mL/min — AB (ref >60)
GLUCOSE: 191 mg/dL — AB (ref 65–99)
Potassium: 3.8 mmol/L (ref 3.5–5.1)
Sodium: 138 mmol/L (ref 135–145)

## 2017-03-10 NOTE — Progress Notes (Signed)
No ICM remote transmission received for 03/06/2017 and next ICM transmission scheduled for 03/31/2017.

## 2017-03-13 ENCOUNTER — Encounter (HOSPITAL_COMMUNITY): Admission: RE | Admit: 2017-03-13 | Payer: BLUE CROSS/BLUE SHIELD | Source: Ambulatory Visit

## 2017-03-13 ENCOUNTER — Other Ambulatory Visit: Payer: Self-pay | Admitting: Internal Medicine

## 2017-03-14 ENCOUNTER — Encounter (HOSPITAL_COMMUNITY): Payer: Self-pay | Admitting: *Deleted

## 2017-03-15 ENCOUNTER — Encounter (HOSPITAL_COMMUNITY): Payer: BLUE CROSS/BLUE SHIELD

## 2017-03-17 ENCOUNTER — Encounter (HOSPITAL_COMMUNITY): Payer: BLUE CROSS/BLUE SHIELD

## 2017-03-20 ENCOUNTER — Encounter (HOSPITAL_COMMUNITY): Payer: Self-pay | Admitting: *Deleted

## 2017-03-20 ENCOUNTER — Encounter (HOSPITAL_COMMUNITY): Payer: BLUE CROSS/BLUE SHIELD

## 2017-03-20 NOTE — Progress Notes (Signed)
Cardiac Individual Treatment Plan  Patient Details  Name: [name] MRN: [id number] Date of Birth: 1965-09-12 Referring Provider:     [facility] from 01/17/2017 in [place]  Referring Provider  Candee Furbish, MD.      Initial Encounter Date:    CARDIAC REHAB PHASE II ORIENTATION from 01/17/2017 in [place]  Date  01/17/17  Referring Provider  Candee Furbish, MD.      Visit Diagnosis: 11/25/16 Stented coronary artery, Heart failure, chronic systolic ([place])   Patient's Home Medications on Admission:  Current Outpatient Prescriptions:  .  atorvastatin (LIPITOR) 80 MG tablet, Take 1 tablet (80 mg total) by mouth daily at 6 PM., Disp: 30 tablet, Rfl: 6 .  carvedilol (COREG) 6.25 MG tablet, Take 0.5 tablets (3.125 mg total) by mouth 2 (two) times daily., Disp: 60 tablet, Rfl: 0 .  clopidogrel (PLAVIX) 75 MG tablet, Take 1 tablet (75 mg total) by mouth daily., Disp: 30 tablet, Rfl: 6 .  digoxin (LANOXIN) 0.125 MG tablet, Take 1 tablet (0.125 mg total) by mouth daily., Disp: 90 tablet, Rfl: 3 .  gabapentin (NEURONTIN) 300 MG capsule, Take 1 capsule (300 mg total) by mouth at bedtime., Disp: 30 capsule, Rfl: 1 .  hydrALAZINE (APRESOLINE) 25 MG tablet, Take 1 tablet (25 mg total) by mouth 3 (three) times daily., Disp: 90 tablet, Rfl: 6 .  insulin glargine (LANTUS) 100 UNIT/ML injection, Inject 0.37 mLs (37 Units total) into the skin daily., Disp: 10 mL, Rfl: 11 .  isosorbide mononitrate (IMDUR) 60 MG 24 hr tablet, Take 1.5 tablets (90 mg total) by mouth daily. (Patient taking differently: Take 60 mg by mouth daily. ), Disp: 45 tablet, Rfl: 6 .  metoCLOPramide (REGLAN) 5 MG tablet, Take one tablet 1/2 hr before meals. (Patient taking differently: Take 5 mg by mouth 3 (three) times daily as needed for nausea or vomiting. ), Disp: 90 tablet, Rfl: 2 .  nitroGLYCERIN (NITROSTAT) 0.4 MG SL tablet, Place 1  tablet (0.4 mg total) under the tongue every 5 (five) minutes as needed for chest pain. (Patient not taking: Reported on 02/21/2017), Disp: 25 tablet, Rfl: 3 .  ondansetron (ZOFRAN ODT) 4 MG disintegrating tablet, Take 1 tablet (4 mg total) by mouth every 6 (six) hours as needed for nausea or vomiting., Disp: 40 tablet, Rfl: 1 .  ranolazine (RANEXA) 500 MG 12 hr tablet, Take 1 tablet (500 mg total) by mouth 2 (two) times daily., Disp: 60 tablet, Rfl: 3 .  torsemide (DEMADEX) 20 MG tablet, Take 4 tablets (80 mg total) by mouth daily., Disp: 360 tablet, Rfl: 3 .  warfarin (COUMADIN) 5 MG tablet, Take as directed by coumadin clinic (Patient taking differently: Take 5-7.5 mg by mouth See admin instructions. Take 7.5 mg by mouth on Monday and Saturday. Take 5 mg by mouth on all other days), Disp: 50 tablet, Rfl: 1  Past Medical History: Past Medical History:  Diagnosis Date  . Angina   . ASCVD (arteriosclerotic cardiovascular disease)    , Anterior infarction 2005, LAD diagonal bifurcation intervention 03/2004  . Automatic implantable cardiac defibrillator -[employer]       . Benign neoplasm of colon   . CHF (congestive heart failure) ([place])   . Chronic systolic heart failure ([place])   . Coronary artery disease     Widely patent previously placed stents in the left anterior   . Crohn's disease ([place])   . Deep  venous thrombosis ([facility])    Recurrent-on Coumadin  . Dyspnea   . Gastroparesis   . GERD (gastroesophageal reflux disease)   . High cholesterol   . Hyperlipidemia   . Hypersomnolent    Previous diagnosis of narcolepsy  . Hypertension, essential   . Ischemic cardiomyopathy    Ejection fraction 15-20% catheterization 2010  . Type II or unspecified type diabetes mellitus without mention of complication, not stated as uncontrolled   . Unspecified gastritis and gastroduodenitis without mention of hemorrhage     Tobacco Use: History  Smoking Status  . Never Smoker  Smokeless Tobacco  .  Never Used    Labs: Recent Review Flowsheet Data    Labs for ITP Cardiac and Pulmonary Rehab Latest Ref Rng & Units 11/28/2016 02/08/2017 02/09/2017 02/10/2017 02/11/2017   Cholestrol 0 - 200 mg/dL - - - - -   LDLCALC 0 - 99 mg/dL - - - - -   HDL >40 mg/dL - - - - -   Trlycerides <150 mg/dL - - - - -   Hemoglobin A1c 4.8 - 5.6 % - - 7.5(H) - -   HCO3 20.0 - 28.0 mmol/L - - - - -   TCO2 0 - 100 mmol/L - - - - -   O2SAT % 71.0 80.4 73.6 58.9 70.3      Capillary Blood Glucose: Lab Results  Component Value Date   GLUCAP 182 (H) 02/17/2017   GLUCAP 109 (H) 02/15/2017   GLUCAP 72 02/13/2017   GLUCAP 150 (H) 02/12/2017   GLUCAP 142 (H) 02/12/2017     Exercise Target Goals:    Exercise Program Goal: Individual exercise prescription set with THRR, safety & activity barriers. Participant demonstrates ability to understand and report RPE using BORG scale, to self-measure pulse accurately, and to acknowledge the importance of the exercise prescription.  Exercise Prescription Goal: Starting with aerobic activity 30 plus minutes a day, 3 days per week for initial exercise prescription. Provide home exercise prescription and guidelines that participant acknowledges understanding prior to discharge.  Activity Barriers & Risk Stratification:   6 Minute Walk:   Oxygen Initial Assessment:   Oxygen Re-Evaluation:   Oxygen Discharge (Final Oxygen Re-Evaluation):   Initial Exercise Prescription:   Perform Capillary Blood Glucose checks as needed.  Exercise Prescription Changes:      Exercise Prescription Changes    Row Name 01/25/17 1400 02/07/17 1200 02/15/17 1536         Response to Exercise   Blood Pressure (Admit) 122/78 120/70 118/72     Blood Pressure (Exercise) 142/78 126/72 140/80     Blood Pressure (Exit) 124/68 112/72 98/62     Heart Rate (Admit) 79 bpm 85 bpm 91 bpm     Heart Rate (Exercise) 100 bpm 113 bpm 113 bpm     Heart Rate (Exit) 79 bpm 85 bpm 90 bpm      Rating of Perceived Exertion (Exercise) 13 12 12      Comments fluid index elevated fluid index elevated  -     Duration Continue with 30 min of aerobic exercise without signs/symptoms of physical distress. Continue with 30 min of aerobic exercise without signs/symptoms of physical distress. Continue with 30 min of aerobic exercise without signs/symptoms of physical distress.     Intensity THRR unchanged THRR unchanged THRR unchanged       Progression   Progression Continue to progress workloads to maintain intensity without signs/symptoms of physical distress. Continue to progress workloads to maintain intensity  without signs/symptoms of physical distress. Continue to progress workloads to maintain intensity without signs/symptoms of physical distress.     Average METs 2.1 2.5 2.6       Resistance Training   Training Prescription Yes Yes Yes     Weight 2lbs 2lbs 2lbs     Reps 10-15 10-15 10-15     Time 10 Minutes 10 Minutes 10 Minutes       Treadmill   MPH 1.8  -  -     Grade 0  -  -     Minutes 10  -  -     METs 2.38  -  -       Bike   Level 0.5 1 1      Minutes 10 10 10      METs 2.11 3.2 3.06       NuStep   Level 2 2 2      SPM 80 80 80     Minutes 10 10 10      METs 1.7 1.9 2.1       Track   Laps  - 8 9     Minutes  - 10 10     METs  - 2.39 2.57        Exercise Comments:      Exercise Comments    Row Name 01/25/17 1451 02/15/17 1643 03/20/17 1539       Exercise Comments Pt was oriented to exercise equipment today. Pt responded well to exercise session will f/u Reviewed home exercise guidelines with patient. Patient plans to start walking 10 minutes, 3x's/day as his mode of home exercise. Exercise limited by PAD pain. Unable to review METs and goals due to frequent absences, Pt last exercise session was on 02/15/17.        Exercise Goals and Review:   Exercise Goals Re-Evaluation :     Exercise Goals Re-Evaluation    Row Name 02/21/17 1612              Exercise Goal Re-Evaluation   Exercise Goals Review Increase Physical Activity;Increase Strenth and Stamina       Comments Pt is improving in cardiorespiratory fitness and MET levels. Pt nustep MET avg increased from 1.7 to 2.1       Expected Outcomes Pt will continue to improve in cardiorespiratory fitness            Discharge Exercise Prescription (Final Exercise Prescription Changes):     Exercise Prescription Changes - 02/15/17 1536      Response to Exercise   Blood Pressure (Admit) 118/72   Blood Pressure (Exercise) 140/80   Blood Pressure (Exit) 98/62   Heart Rate (Admit) 91 bpm   Heart Rate (Exercise) 113 bpm   Heart Rate (Exit) 90 bpm   Rating of Perceived Exertion (Exercise) 12   Duration Continue with 30 min of aerobic exercise without signs/symptoms of physical distress.   Intensity THRR unchanged     Progression   Progression Continue to progress workloads to maintain intensity without signs/symptoms of physical distress.   Average METs 2.6     Resistance Training   Training Prescription Yes   Weight 2lbs   Reps 10-15   Time 10 Minutes     Bike   Level 1   Minutes 10   METs 3.06     NuStep   Level 2   SPM 80   Minutes 10   METs 2.1     Track   Laps  9   Minutes 10   METs 2.57      Nutrition:  Target Goals: Understanding of nutrition guidelines, daily intake of sodium <1582m, cholesterol <202m calories 30% from fat and 7% or less from saturated fats, daily to have 5 or more servings of fruits and vegetables.  Biometrics:    Nutrition Therapy Plan and Nutrition Goals:     Nutrition Therapy & Goals - 01/31/17 1451      Nutrition Therapy   Diet Therapeutic Lifestyle Changes, Carb Modified   Drug/Food Interactions Coumadin/Vit K     Personal Nutrition Goals   Nutrition Goal Wt loss of 1-2 lb/week to a wt loss goal of 6-24 lb [facility] [place] [facility] Improved glycemic control as evidenced by pt's A1c decrease  toward 7 or less.      Intervention Plan   Intervention Prescribe, educate and counsel regarding individualized specific dietary modifications aiming towards targeted core components such as weight, hypertension, lipid management, diabetes, heart failure and other comorbidities.   Expected Outcomes Short Term Goal: Understand basic principles of dietary content, such as calories, fat, sodium, cholesterol and nutrients.;Long Term Goal: Adherence to prescribed nutrition plan.      Nutrition Discharge: Nutrition Scores:     Nutrition Assessments - 01/31/17 1451      MEDFICTS Scores   Pre Score 23      Nutrition Goals Re-Evaluation:   Nutrition Goals Re-Evaluation:   Nutrition Goals Discharge (Final Nutrition Goals Re-Evaluation):   Psychosocial: Target Goals: Acknowledge presence or absence of significant depression and/or stress, maximize coping skills, provide positive support system. Participant is able to verbalize types and ability to use techniques and skills needed for reducing stress and depression.  Initial Review & Psychosocial Screening:   Quality of Life Scores:   PHQ-9: Recent Review Flowsheet Data    [facility] 03/06/2017 01/25/2017 01/17/2017 11/10/2016 11/09/2016   Decreased Interest 0 0 0 0 0   Down, Depressed, Hopeless 0 1 0 0 0   PHQ - 2 Score 0 1 0 0 0     Interpretation of Total Score  Total Score Depression Severity:  1-4 = Minimal depression, 5-9 = Mild depression, 10-14 = Moderate depression, 15-19 = Moderately severe depression, 20-27 = Severe depression   Psychosocial Evaluation and Intervention:     Psychosocial Evaluation - 03/20/17 1626      Psychosocial Evaluation & Interventions   Interventions Stress management education;Relaxation education;Encouraged to exercise with the program and follow exercise prescription   Comments pt guarded with responses but feels supported by family last exercised on 6/13      Psychosocial  Re-Evaluation:     Psychosocial Re-Evaluation    [place] 02/21/17 1329 02/21/17 1354 03/20/17 1626         Psychosocial Re-Evaluation   Current issues with (P)  Current Stress Concerns;Current Anxiety/Panic Current Stress Concerns;Current Anxiety/Panic Current Stress Concerns;Current Anxiety/Panic     Comments (P)  chronic illness - chf chronic illness chronic illness     Interventions (P)  Relaxation education;Stress management education;Encouraged to attend Cardiac Rehabilitation for the exercise Relaxation education;Stress management education;Encouraged to attend Cardiac Rehabilitation for the exercise Encouraged to attend Cardiac Rehabilitation for the exercise;Relaxation education;Stress management education     Continue Psychosocial Services   - Follow up required by staff Follow up required by staff       Initial Review   Source of Stress Concerns  - Chronic Illness  -  Psychosocial Discharge (Final Psychosocial Re-Evaluation):     Psychosocial Re-Evaluation - 03/20/17 1626      Psychosocial Re-Evaluation   Current issues with Current Stress Concerns;Current Anxiety/Panic   Comments chronic illness   Interventions Encouraged to attend Cardiac Rehabilitation for the exercise;Relaxation education;Stress management education   Continue Psychosocial Services  Follow up required by staff      Vocational Rehabilitation: Provide vocational rehab assistance to qualifying candidates.   Vocational Rehab Evaluation & Intervention:   Education: Education Goals: Education classes will be provided on a weekly basis, covering required topics. Participant will state understanding/return demonstration of topics presented.  Learning Barriers/Preferences:   Education Topics: Count Your Pulse:  -Group instruction provided by verbal instruction, demonstration, patient participation and written materials to support subject.  Instructors address importance of being able to find  your pulse and how to count your pulse when at home without a heart monitor.  Patients get hands on experience counting their pulse with staff help and individually.   CARDIAC REHAB PHASE II EXERCISE from 02/03/2017 in [place]  Date  02/03/17  Instruction Review Code  2- meets goals/outcomes      Heart Attack, Angina, and Risk Factor Modification:  -Group instruction provided by verbal instruction, video, and written materials to support subject.  Instructors address signs and symptoms of angina and heart attacks.    Also discuss risk factors for heart disease and how to make changes to improve heart health risk factors.   Functional Fitness:  -Group instruction provided by verbal instruction, demonstration, patient participation, and written materials to support subject.  Instructors address safety measures for doing things around the house.  Discuss how to get up and down off the floor, how to pick things up properly, how to safely get out of a chair without assistance, and balance training.   Meditation and Mindfulness:  -Group instruction provided by verbal instruction, patient participation, and written materials to support subject.  Instructor addresses importance of mindfulness and meditation practice to help reduce stress and improve awareness.  Instructor also leads participants through a meditation exercise.    Stretching for Flexibility and Mobility:  -Group instruction provided by verbal instruction, patient participation, and written materials to support subject.  Instructors lead participants through series of stretches that are designed to increase flexibility thus improving mobility.  These stretches are additional exercise for major muscle groups that are typically performed during regular warm up and cool down.   CARDIAC REHAB PHASE II EXERCISE from 02/03/2017 in [place]  Date  01/27/17  Instruction Review Code   2- meets goals/outcomes      Hands Only CPR:  -Group verbal, video, and participation provides a basic overview of AHA guidelines for community CPR. Role-play of emergencies allow participants the opportunity to practice calling for help and chest compression technique with discussion of AED use.   Hypertension: -Group verbal and written instruction that provides a basic overview of hypertension including the most recent diagnostic guidelines, risk factor reduction with self-care instructions and medication management.    Nutrition I class: Heart Healthy Eating:  -Group instruction provided by PowerPoint slides, verbal discussion, and written materials to support subject matter. The instructor gives an explanation and review of the Therapeutic Lifestyle Changes diet recommendations, which includes a discussion on lipid goals, dietary fat, sodium, fiber, plant stanol/sterol esters, sugar, and the components of a well-balanced, healthy diet.   Nutrition II class: Lifestyle Skills:  -Group instruction  provided by PowerPoint slides, verbal discussion, and written materials to support subject matter. The instructor gives an explanation and review of label reading, grocery shopping for heart health, heart healthy recipe modifications, and ways to make healthier choices when eating out.   Diabetes Question & Answer:  -Group instruction provided by PowerPoint slides, verbal discussion, and written materials to support subject matter. The instructor gives an explanation and review of diabetes co-morbidities, pre- and post-prandial blood glucose goals, pre-exercise blood glucose goals, signs, symptoms, and treatment of hypoglycemia and hyperglycemia, and foot care basics.   Diabetes Blitz:  -Group instruction provided by PowerPoint slides, verbal discussion, and written materials to support subject matter. The instructor gives an explanation and review of the physiology behind type 1 and type 2  diabetes, diabetes medications and rational behind using different medications, pre- and post-prandial blood glucose recommendations and Hemoglobin A1c goals, diabetes diet, and exercise including blood glucose guidelines for exercising safely.    Portion Distortion:  -Group instruction provided by PowerPoint slides, verbal discussion, written materials, and food models to support subject matter. The instructor gives an explanation of serving size versus portion size, changes in portions sizes over the last 20 years, and what consists of a serving from each food group.   Stress Management:  -Group instruction provided by verbal instruction, video, and written materials to support subject matter.  Instructors review role of stress in heart disease and how to cope with stress positively.     Exercising on Your Own:  -Group instruction provided by verbal instruction, power point, and written materials to support subject.  Instructors discuss benefits of exercise, components of exercise, frequency and intensity of exercise, and end points for exercise.  Also discuss use of nitroglycerin and activating EMS.  Review options of places to exercise outside of rehab.  Review guidelines for sex with heart disease.   Cardiac Drugs I:  -Group instruction provided by verbal instruction and written materials to support subject.  Instructor reviews cardiac drug classes: antiplatelets, anticoagulants, beta blockers, and statins.  Instructor discusses reasons, side effects, and lifestyle considerations for each drug class.   Cardiac Drugs II:  -Group instruction provided by verbal instruction and written materials to support subject.  Instructor reviews cardiac drug classes: angiotensin converting enzyme inhibitors (ACE-I), angiotensin II receptor blockers (ARBs), nitrates, and calcium channel blockers.  Instructor discusses reasons, side effects, and lifestyle considerations for each drug class.   Anatomy and  Physiology of the Circulatory System:  Group verbal and written instruction and models provide basic cardiac anatomy and physiology, with the coronary electrical and arterial systems. Review of: AMI, Angina, Valve disease, Heart Failure, Peripheral Artery Disease, Cardiac Arrhythmia, Pacemakers, and the ICD.   CARDIAC REHAB PHASE II EXERCISE from 02/03/2017 in [place]  Date  02/01/17  Instruction Review Code  2- meets goals/outcomes      Other Education:  -Group or individual verbal, written, or video instructions that support the educational goals of the cardiac rehab program.   Knowledge Questionnaire Score:   Core Components/Risk Factors/Patient Goals at Admission:   Core Components/Risk Factors/Patient Goals Review:      Goals and Risk Factor Review    Row Name 01/26/17 1257 02/21/17 1327 03/20/17 1625         Core Components/Risk Factors/Patient Goals Review   Personal Goals Review Heart Failure;Lipids;Diabetes;Hypertension;Improve shortness of breath with ADL's Heart Failure;Lipids;Diabetes;Hypertension;Improve shortness of breath with ADL's Heart Failure;Lipids;Diabetes;Hypertension;Improve shortness of breath with ADL's  Review Pt has completed 2 exercise sessions, will continue to monitor. slow progress due to pt progressive nature of the disease process slow progress due to pt progressive nature of the disease process last exercised on 6/13        Core Components/Risk Factors/Patient Goals at Discharge (Final Review):      Goals and Risk Factor Review - 03/20/17 1625      Core Components/Risk Factors/Patient Goals Review   Personal Goals Review Heart Failure;Lipids;Diabetes;Hypertension;Improve shortness of breath with ADL's   Review slow progress due to pt progressive nature of the disease process last exercised on 6/13      ITP Comments:   Comments:  Pt is making slow progress toward personal goals after completing 7  sessions. Pt last exercised on 6/13.  Unclear of reasons patient has not returned to rehab. Unable to complete psychosocial assessment at this time due to patients non attendance Pt would benefit from returning to the exercise and education program.  Continue to reach out to patient and plan to discharge him from the program at the end of this month. When pt returns continue to  recommend continued exercise and life style modification education including  stress management and relaxation techniques to decrease cardiac risk profile. Cherre Huger, BSN Cardiac and Training and development officer

## 2017-03-20 NOTE — Addendum Note (Signed)
Encounter addended by: Dorna Bloom D on: 03/20/2017  3:41 PM<BR>    Actions taken: Flowsheet accepted, Visit Navigator Flowsheet section accepted

## 2017-03-22 ENCOUNTER — Encounter (HOSPITAL_COMMUNITY): Payer: BLUE CROSS/BLUE SHIELD

## 2017-03-24 ENCOUNTER — Encounter (HOSPITAL_COMMUNITY): Payer: BLUE CROSS/BLUE SHIELD

## 2017-03-27 ENCOUNTER — Encounter (HOSPITAL_COMMUNITY): Payer: BLUE CROSS/BLUE SHIELD

## 2017-03-29 ENCOUNTER — Encounter (HOSPITAL_COMMUNITY): Payer: BLUE CROSS/BLUE SHIELD

## 2017-03-31 ENCOUNTER — Encounter (HOSPITAL_COMMUNITY): Payer: BLUE CROSS/BLUE SHIELD

## 2017-03-31 ENCOUNTER — Encounter: Payer: BLUE CROSS/BLUE SHIELD | Admitting: *Deleted

## 2017-03-31 ENCOUNTER — Telehealth: Payer: Self-pay | Admitting: Cardiology

## 2017-03-31 NOTE — Telephone Encounter (Signed)
Spoke with pt and reminded pt of remote transmission that is due today. Pt verbalized understanding.   

## 2017-04-01 ENCOUNTER — Other Ambulatory Visit: Payer: Self-pay | Admitting: Internal Medicine

## 2017-04-03 ENCOUNTER — Encounter (HOSPITAL_COMMUNITY): Payer: BLUE CROSS/BLUE SHIELD

## 2017-04-03 ENCOUNTER — Telehealth (HOSPITAL_COMMUNITY): Payer: Self-pay

## 2017-04-03 ENCOUNTER — Ambulatory Visit (INDEPENDENT_AMBULATORY_CARE_PROVIDER_SITE_OTHER): Payer: BLUE CROSS/BLUE SHIELD | Admitting: *Deleted

## 2017-04-03 DIAGNOSIS — I255 Ischemic cardiomyopathy: Secondary | ICD-10-CM | POA: Diagnosis not present

## 2017-04-03 NOTE — Telephone Encounter (Signed)
Patient reports worsening SOB x 2 days, worse when laying down. Advised to do device transmission to assess impedence and fluid and advised to take extra 40 mg torsemide x 1 dose now per Dr. Aundra Dubin (takes 80 mg once daily in the mornings). Weight stable, no swelling noted, reports compliance with meds, diet, and fluid restrictions. Will review transmission and call back if further instructions are needed. Patient to follow up tomorrow to update on s/s.  Renee Pain, RN

## 2017-04-03 NOTE — Progress Notes (Signed)
Remote ICD transmission.   

## 2017-04-05 ENCOUNTER — Encounter (HOSPITAL_COMMUNITY)
Admission: RE | Admit: 2017-04-05 | Payer: BLUE CROSS/BLUE SHIELD | Source: Ambulatory Visit | Attending: Cardiology | Admitting: Cardiology

## 2017-04-05 ENCOUNTER — Encounter (HOSPITAL_COMMUNITY): Payer: BLUE CROSS/BLUE SHIELD

## 2017-04-05 ENCOUNTER — Encounter (HOSPITAL_COMMUNITY): Payer: Self-pay | Admitting: Emergency Medicine

## 2017-04-05 ENCOUNTER — Inpatient Hospital Stay (HOSPITAL_COMMUNITY)
Admission: EM | Admit: 2017-04-05 | Discharge: 2017-04-13 | DRG: 291 | Disposition: A | Discharge status: Home / Self Care | Payer: BLUE CROSS/BLUE SHIELD | Attending: Family Medicine | Admitting: Family Medicine

## 2017-04-05 ENCOUNTER — Emergency Department (HOSPITAL_COMMUNITY): Payer: BLUE CROSS/BLUE SHIELD

## 2017-04-05 DIAGNOSIS — Z515 Encounter for palliative care: Secondary | ICD-10-CM

## 2017-04-05 DIAGNOSIS — F0390 Unspecified dementia without behavioral disturbance: Secondary | ICD-10-CM | POA: Diagnosis present

## 2017-04-05 DIAGNOSIS — K3184 Gastroparesis: Secondary | ICD-10-CM | POA: Diagnosis present

## 2017-04-05 DIAGNOSIS — E785 Hyperlipidemia, unspecified: Secondary | ICD-10-CM | POA: Diagnosis present

## 2017-04-05 DIAGNOSIS — I130 Hypertensive heart and chronic kidney disease with heart failure and stage 1 through stage 4 chronic kidney disease, or unspecified chronic kidney disease: Secondary | ICD-10-CM | POA: Diagnosis present

## 2017-04-05 DIAGNOSIS — Z794 Long term (current) use of insulin: Secondary | ICD-10-CM

## 2017-04-05 DIAGNOSIS — I509 Heart failure, unspecified: Secondary | ICD-10-CM

## 2017-04-05 DIAGNOSIS — I5043 Acute on chronic combined systolic (congestive) and diastolic (congestive) heart failure: Secondary | ICD-10-CM

## 2017-04-05 DIAGNOSIS — N179 Acute kidney failure, unspecified: Secondary | ICD-10-CM | POA: Diagnosis present

## 2017-04-05 DIAGNOSIS — Z452 Encounter for adjustment and management of vascular access device: Secondary | ICD-10-CM

## 2017-04-05 DIAGNOSIS — R1114 Bilious vomiting: Secondary | ICD-10-CM | POA: Diagnosis not present

## 2017-04-05 DIAGNOSIS — Z7189 Other specified counseling: Secondary | ICD-10-CM | POA: Diagnosis not present

## 2017-04-05 DIAGNOSIS — I252 Old myocardial infarction: Secondary | ICD-10-CM

## 2017-04-05 DIAGNOSIS — E7800 Pure hypercholesterolemia, unspecified: Secondary | ICD-10-CM | POA: Diagnosis present

## 2017-04-05 DIAGNOSIS — R112 Nausea with vomiting, unspecified: Secondary | ICD-10-CM

## 2017-04-05 DIAGNOSIS — R1110 Vomiting, unspecified: Secondary | ICD-10-CM

## 2017-04-05 DIAGNOSIS — I2510 Atherosclerotic heart disease of native coronary artery without angina pectoris: Secondary | ICD-10-CM | POA: Diagnosis present

## 2017-04-05 DIAGNOSIS — E669 Obesity, unspecified: Secondary | ICD-10-CM | POA: Diagnosis present

## 2017-04-05 DIAGNOSIS — E1122 Type 2 diabetes mellitus with diabetic chronic kidney disease: Secondary | ICD-10-CM | POA: Diagnosis present

## 2017-04-05 DIAGNOSIS — I10 Essential (primary) hypertension: Secondary | ICD-10-CM

## 2017-04-05 DIAGNOSIS — R011 Cardiac murmur, unspecified: Secondary | ICD-10-CM | POA: Diagnosis present

## 2017-04-05 DIAGNOSIS — R197 Diarrhea, unspecified: Secondary | ICD-10-CM

## 2017-04-05 DIAGNOSIS — D126 Benign neoplasm of colon, unspecified: Secondary | ICD-10-CM | POA: Diagnosis present

## 2017-04-05 DIAGNOSIS — I5023 Acute on chronic systolic (congestive) heart failure: Secondary | ICD-10-CM | POA: Diagnosis present

## 2017-04-05 DIAGNOSIS — Z888 Allergy status to other drugs, medicaments and biological substances status: Secondary | ICD-10-CM

## 2017-04-05 DIAGNOSIS — Z6830 Body mass index (BMI) 30.0-30.9, adult: Secondary | ICD-10-CM

## 2017-04-05 DIAGNOSIS — K5090 Crohn's disease, unspecified, without complications: Secondary | ICD-10-CM | POA: Diagnosis present

## 2017-04-05 DIAGNOSIS — J90 Pleural effusion, not elsewhere classified: Secondary | ICD-10-CM | POA: Diagnosis present

## 2017-04-05 DIAGNOSIS — Z79899 Other long term (current) drug therapy: Secondary | ICD-10-CM

## 2017-04-05 DIAGNOSIS — E1143 Type 2 diabetes mellitus with diabetic autonomic (poly)neuropathy: Secondary | ICD-10-CM | POA: Diagnosis present

## 2017-04-05 DIAGNOSIS — Z955 Presence of coronary angioplasty implant and graft: Secondary | ICD-10-CM

## 2017-04-05 DIAGNOSIS — R079 Chest pain, unspecified: Secondary | ICD-10-CM | POA: Diagnosis not present

## 2017-04-05 DIAGNOSIS — Z95810 Presence of automatic (implantable) cardiac defibrillator: Secondary | ICD-10-CM

## 2017-04-05 DIAGNOSIS — Z7902 Long term (current) use of antithrombotics/antiplatelets: Secondary | ICD-10-CM

## 2017-04-05 DIAGNOSIS — E1110 Type 2 diabetes mellitus with ketoacidosis without coma: Secondary | ICD-10-CM | POA: Diagnosis present

## 2017-04-05 DIAGNOSIS — K219 Gastro-esophageal reflux disease without esophagitis: Secondary | ICD-10-CM | POA: Diagnosis present

## 2017-04-05 DIAGNOSIS — N183 Chronic kidney disease, stage 3 (moderate): Secondary | ICD-10-CM | POA: Diagnosis present

## 2017-04-05 DIAGNOSIS — Z833 Family history of diabetes mellitus: Secondary | ICD-10-CM

## 2017-04-05 DIAGNOSIS — Z8249 Family history of ischemic heart disease and other diseases of the circulatory system: Secondary | ICD-10-CM

## 2017-04-05 DIAGNOSIS — I5022 Chronic systolic (congestive) heart failure: Secondary | ICD-10-CM

## 2017-04-05 DIAGNOSIS — G47419 Narcolepsy without cataplexy: Secondary | ICD-10-CM | POA: Diagnosis present

## 2017-04-05 DIAGNOSIS — I255 Ischemic cardiomyopathy: Secondary | ICD-10-CM | POA: Diagnosis present

## 2017-04-05 DIAGNOSIS — Z86718 Personal history of other venous thrombosis and embolism: Secondary | ICD-10-CM

## 2017-04-05 DIAGNOSIS — Z7901 Long term (current) use of anticoagulants: Secondary | ICD-10-CM

## 2017-04-05 DIAGNOSIS — E875 Hyperkalemia: Secondary | ICD-10-CM | POA: Diagnosis present

## 2017-04-05 DIAGNOSIS — Z8371 Family history of colonic polyps: Secondary | ICD-10-CM

## 2017-04-05 DIAGNOSIS — E876 Hypokalemia: Secondary | ICD-10-CM | POA: Diagnosis present

## 2017-04-05 DIAGNOSIS — R110 Nausea: Secondary | ICD-10-CM | POA: Diagnosis present

## 2017-04-05 DIAGNOSIS — E0810 Diabetes mellitus due to underlying condition with ketoacidosis without coma: Secondary | ICD-10-CM | POA: Diagnosis not present

## 2017-04-05 DIAGNOSIS — I340 Nonrheumatic mitral (valve) insufficiency: Secondary | ICD-10-CM | POA: Diagnosis not present

## 2017-04-05 DIAGNOSIS — Z823 Family history of stroke: Secondary | ICD-10-CM

## 2017-04-05 DIAGNOSIS — Z9049 Acquired absence of other specified parts of digestive tract: Secondary | ICD-10-CM

## 2017-04-05 LAB — CBG MONITORING, ED
Glucose-Capillary: 424 mg/dL — ABNORMAL HIGH (ref 65–99)
Glucose-Capillary: 388 mg/dL — ABNORMAL HIGH (ref 65–99)

## 2017-04-05 LAB — I-STAT ARTERIAL BLOOD GAS, ED
Acid-base deficit: 2 mmol/L (ref 0.0–2.0)
Bicarbonate: 21.3 mmol/L (ref 20.0–28.0)
O2 Saturation: 97 %
PH ART: 7.437 (ref 7.350–7.450)
Patient temperature: 98.6
TCO2: 22 mmol/L (ref 0–100)
pCO2 arterial: 31.6 mmHg — ABNORMAL LOW (ref 32.0–48.0)
pO2, Arterial: 84 mmHg (ref 83.0–108.0)

## 2017-04-05 LAB — BASIC METABOLIC PANEL
ANION GAP: 18 — AB (ref 5–15)
Anion gap: 15 (ref 5–15)
BUN: 23 mg/dL — ABNORMAL HIGH (ref 6–20)
BUN: 27 mg/dL — ABNORMAL HIGH (ref 6–20)
CALCIUM: 8.3 mg/dL — AB (ref 8.9–10.3)
CO2: 18 mmol/L — ABNORMAL LOW (ref 22–32)
CO2: 20 mmol/L — AB (ref 22–32)
CREATININE: 1.55 mg/dL — AB (ref 0.61–1.24)
Calcium: 8.4 mg/dL — ABNORMAL LOW (ref 8.9–10.3)
Chloride: 101 mmol/L (ref 101–111)
Chloride: 101 mmol/L (ref 101–111)
Creatinine, Ser: 1.42 mg/dL — ABNORMAL HIGH (ref 0.61–1.24)
GFR calc Af Amer: >60 mL/min (ref >60)
GFR calc Af Amer: 58 mL/min — ABNORMAL LOW (ref >60)
GFR, EST NON AFRICAN AMERICAN: 55 mL/min — AB (ref >60)
GFR, EST NON AFRICAN AMERICAN: 50 mL/min — AB (ref >60)
GLUCOSE: 312 mg/dL — AB (ref 65–99)
Glucose, Bld: 424 mg/dL — ABNORMAL HIGH (ref 65–99)
POTASSIUM: 4.2 mmol/L (ref 3.5–5.1)
Potassium: 4.2 mmol/L (ref 3.5–5.1)
SODIUM: 137 mmol/L (ref 135–145)
Sodium: 136 mmol/L (ref 135–145)

## 2017-04-05 LAB — URINALYSIS, ROUTINE W REFLEX MICROSCOPIC
Bacteria, UA: NONE SEEN
Bilirubin Urine: NEGATIVE
Ketones, ur: 5 mg/dL — AB
Leukocytes, UA: NEGATIVE
NITRITE: NEGATIVE
PH: 5 (ref 5.0–8.0)
SPECIFIC GRAVITY, URINE: 1.011 (ref 1.005–1.030)
Squamous Epithelial / LPF: NONE SEEN

## 2017-04-05 LAB — RAPID URINE DRUG SCREEN, HOSP PERFORMED
Amphetamines: NOT DETECTED
BARBITURATES: NOT DETECTED
BENZODIAZEPINES: NOT DETECTED
Cocaine: NOT DETECTED
Opiates: NOT DETECTED
TETRAHYDROCANNABINOL: NOT DETECTED

## 2017-04-05 LAB — CBC
HCT: 34.8 % — ABNORMAL LOW (ref 39.0–52.0)
HEMOGLOBIN: 11.3 g/dL — AB (ref 13.0–17.0)
MCH: 26.7 pg (ref 26.0–34.0)
MCHC: 32.5 g/dL (ref 30.0–36.0)
MCV: 82.1 fL (ref 78.0–100.0)
PLATELETS: 219 10*3/uL (ref 150–400)
RBC: 4.24 MIL/uL (ref 4.22–5.81)
RDW: 14.5 % (ref 11.5–15.5)
WBC: 9.4 10*3/uL (ref 4.0–10.5)

## 2017-04-05 LAB — I-STAT TROPONIN, ED: TROPONIN I, POC: 0.01 ng/mL (ref 0.00–0.08)

## 2017-04-05 LAB — DIGOXIN LEVEL: Digoxin Level: <0.2 ng/mL — ABNORMAL LOW (ref 0.8–2.0)

## 2017-04-05 LAB — HEPATIC FUNCTION PANEL
ALK PHOS: 130 U/L — AB (ref 38–126)
ALT: 19 U/L (ref 17–63)
AST: 25 U/L (ref 15–41)
Albumin: 3.2 g/dL — ABNORMAL LOW (ref 3.5–5.0)
BILIRUBIN INDIRECT: 1 mg/dL — AB (ref 0.3–0.9)
Bilirubin, Direct: 0.5 mg/dL (ref 0.1–0.5)
TOTAL PROTEIN: 7.4 g/dL (ref 6.5–8.1)
Total Bilirubin: 1.5 mg/dL — ABNORMAL HIGH (ref 0.3–1.2)

## 2017-04-05 LAB — TROPONIN I: Troponin I: <0.03 ng/mL (ref <0.03)

## 2017-04-05 LAB — MAGNESIUM: Magnesium: 1.4 mg/dL — ABNORMAL LOW (ref 1.7–2.4)

## 2017-04-05 LAB — BRAIN NATRIURETIC PEPTIDE: B Natriuretic Peptide: 2214.8 pg/mL — ABNORMAL HIGH (ref 0.0–100.0)

## 2017-04-05 MED ORDER — ONDANSETRON HCL 4 MG/2ML IJ SOLN
4.0000 mg | Freq: Once | INTRAMUSCULAR | Status: AC
  Administered 2017-04-05: 4 mg via INTRAVENOUS
  Filled 2017-04-05: qty 2

## 2017-04-05 MED ORDER — CLOPIDOGREL BISULFATE 75 MG PO TABS
75.0000 mg | ORAL_TABLET | Freq: Every day | ORAL | Status: DC
  Administered 2017-04-07 – 2017-04-13 (×7): 75 mg via ORAL
  Filled 2017-04-05 (×8): qty 1

## 2017-04-05 MED ORDER — ACETAMINOPHEN 650 MG RE SUPP: 650.0000 mg | Freq: Four times a day (QID) | RECTAL | Status: DC | PRN

## 2017-04-05 MED ORDER — SODIUM CHLORIDE 0.9% FLUSH: 3.0000 mL | Freq: Two times a day (BID) | INTRAVENOUS | Status: DC

## 2017-04-05 MED ORDER — SODIUM CHLORIDE 0.9 % IV SOLN: 250.0000 mL | INTRAVENOUS | Status: DC | PRN

## 2017-04-05 MED ORDER — HYDRALAZINE HCL 25 MG PO TABS
25.0000 mg | ORAL_TABLET | Freq: Three times a day (TID) | ORAL | Status: DC
  Administered 2017-04-06 (×2): 25 mg via ORAL
  Filled 2017-04-05 (×2): qty 1

## 2017-04-05 MED ORDER — SODIUM CHLORIDE 0.9% FLUSH: 3.0000 mL | INTRAVENOUS | Status: DC | PRN

## 2017-04-05 MED ORDER — ISOSORBIDE MONONITRATE ER 60 MG PO TB24: 60.0000 mg | ORAL_TABLET | Freq: Every day | ORAL | Status: DC

## 2017-04-05 MED ORDER — RANOLAZINE ER 500 MG PO TB12
500.0000 mg | ORAL_TABLET | Freq: Two times a day (BID) | ORAL | Status: DC
  Administered 2017-04-06 – 2017-04-13 (×14): 500 mg via ORAL
  Filled 2017-04-05 (×15): qty 1

## 2017-04-05 MED ORDER — SODIUM CHLORIDE 0.9 % IV SOLN
INTRAVENOUS | Status: DC
  Administered 2017-04-05: 3.6 [IU]/h via INTRAVENOUS
  Filled 2017-04-05: qty 1

## 2017-04-05 MED ORDER — CARVEDILOL 3.125 MG PO TABS
3.1250 mg | ORAL_TABLET | Freq: Two times a day (BID) | ORAL | Status: DC
  Administered 2017-04-06 – 2017-04-13 (×14): 3.125 mg via ORAL
  Filled 2017-04-05 (×16): qty 1

## 2017-04-05 MED ORDER — INSULIN GLARGINE 100 UNIT/ML ~~LOC~~ SOLN
30.0000 [IU] | Freq: Every day | SUBCUTANEOUS | Status: DC
  Filled 2017-04-05: qty 0.3

## 2017-04-05 MED ORDER — FUROSEMIDE 10 MG/ML IJ SOLN
80.0000 mg | Freq: Two times a day (BID) | INTRAMUSCULAR | Status: DC
  Filled 2017-04-05: qty 8

## 2017-04-05 MED ORDER — FUROSEMIDE 10 MG/ML IJ SOLN
40.0000 mg | Freq: Once | INTRAMUSCULAR | Status: AC
  Administered 2017-04-05: 40 mg via INTRAVENOUS
  Filled 2017-04-05: qty 4

## 2017-04-05 MED ORDER — MORPHINE SULFATE (PF) 4 MG/ML IV SOLN
2.0000 mg | INTRAVENOUS | Status: DC | PRN
  Administered 2017-04-05: 2 mg via INTRAVENOUS
  Filled 2017-04-05: qty 1

## 2017-04-05 MED ORDER — ONDANSETRON HCL 4 MG/2ML IJ SOLN
4.0000 mg | Freq: Four times a day (QID) | INTRAMUSCULAR | Status: DC | PRN
  Administered 2017-04-05 – 2017-04-06 (×2): 4 mg via INTRAVENOUS
  Filled 2017-04-05 (×2): qty 2

## 2017-04-05 MED ORDER — SODIUM CHLORIDE 0.9 % IV SOLN
INTRAVENOUS | Status: DC
  Administered 2017-04-05: 22:00:00 via INTRAVENOUS

## 2017-04-05 MED ORDER — ATORVASTATIN CALCIUM 80 MG PO TABS
80.0000 mg | ORAL_TABLET | Freq: Every day | ORAL | Status: DC
  Administered 2017-04-06 – 2017-04-12 (×7): 80 mg via ORAL
  Filled 2017-04-05 (×8): qty 1

## 2017-04-05 MED ORDER — METOCLOPRAMIDE HCL 5 MG PO TABS
5.0000 mg | ORAL_TABLET | Freq: Three times a day (TID) | ORAL | Status: DC | PRN
Start: 2017-04-05 — End: 2017-04-06
  Administered 2017-04-05: 5 mg via ORAL
  Filled 2017-04-05: qty 1

## 2017-04-05 MED ORDER — PROMETHAZINE HCL 25 MG PO TABS
12.5000 mg | ORAL_TABLET | Freq: Four times a day (QID) | ORAL | Status: DC | PRN
  Administered 2017-04-06: 12.5 mg via ORAL
  Filled 2017-04-05: qty 1

## 2017-04-05 MED ORDER — GABAPENTIN 300 MG PO CAPS
300.0000 mg | ORAL_CAPSULE | Freq: Every day | ORAL | Status: DC
  Administered 2017-04-06 – 2017-04-08 (×3): 300 mg via ORAL
  Filled 2017-04-05 (×3): qty 1

## 2017-04-05 MED ORDER — ACETAMINOPHEN 325 MG PO TABS
650.0000 mg | ORAL_TABLET | Freq: Four times a day (QID) | ORAL | Status: DC | PRN
  Administered 2017-04-11: 650 mg via ORAL
  Filled 2017-04-05: qty 2

## 2017-04-05 MED ORDER — NITROGLYCERIN IN D5W 200-5 MCG/ML-% IV SOLN
0.0000 ug/min | INTRAVENOUS | Status: DC
  Administered 2017-04-05: 5 ug/min via INTRAVENOUS
  Administered 2017-04-06: 30 ug/min via INTRAVENOUS
  Filled 2017-04-05 (×2): qty 250

## 2017-04-05 MED ORDER — NITROGLYCERIN 0.4 MG SL SUBL
0.4000 mg | SUBLINGUAL_TABLET | SUBLINGUAL | Status: DC | PRN
Start: 2017-04-05 — End: 2017-04-13

## 2017-04-05 MED ORDER — DIGOXIN 125 MCG PO TABS
0.1250 mg | ORAL_TABLET | Freq: Every day | ORAL | Status: DC
  Administered 2017-04-05 – 2017-04-13 (×8): 0.125 mg via ORAL
  Filled 2017-04-05 (×9): qty 1

## 2017-04-05 NOTE — ED Notes (Signed)
Attempted to stick pt x2 for blood draw. Unsuccessful. Notified phlebotomy for lab draw.

## 2017-04-05 NOTE — ED Provider Notes (Addendum)
[facility] DEPT Provider Note   CSN: [id number] Arrival date & time: 04/05/17  1124     History   Chief Complaint Chief Complaint  Patient presents with  . Chest Pain  . Emesis    HPI [name] is a 51 y.o. male.  Patient w hx chf, c/o nausea, and increased sob x 4 days. Symptoms gradual onset, constant, persistent, moderate, slowly worse. States has felt tight in chest for past few days. No episodic chest pain. Denies vomiting. No abd pain. No diarrhea. Denies cough or uri c/o. No fever or chills. States unable to tolerate/take any of his meds today.    The history is provided by the patient.  Chest Pain   Associated symptoms include nausea and shortness of breath. Pertinent negatives include no abdominal pain, no back pain, no cough, no fever, no headaches and no vomiting.  Emesis   Pertinent negatives include no abdominal pain, no cough, no fever and no headaches.    Past Medical History:  Diagnosis Date  . Angina   . ASCVD (arteriosclerotic cardiovascular disease)    , Anterior infarction 2005, LAD diagonal bifurcation intervention 03/2004  . Automatic implantable cardiac defibrillator -[employer]       . Benign neoplasm of colon   . CHF (congestive heart failure) ([place])   . Chronic systolic heart failure ([place])   . Coronary artery disease     Widely patent previously placed stents in the left anterior   . Crohn's disease ([place])   . 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 Dyspnea   . Gastroparesis   . GERD (gastroesophageal reflux disease)   . High cholesterol   . Hyperlipidemia   . Hypersomnolent    Previous diagnosis of narcolepsy  . Hypertension, essential   . Ischemic cardiomyopathy    Ejection fraction 15-20% catheterization 2010  . Type II or unspecified type diabetes mellitus without mention of complication, not stated as uncontrolled   . Unspecified gastritis and gastroduodenitis without mention of hemorrhage     Patient  Active Problem List   Diagnosis Date Noted  . Type 2 diabetes mellitus with complication, with long-term current use of insulin ([place]) 03/08/2017  . Acute on chronic systolic (congestive) heart failure ([place]) 02/08/2017  . CKD (chronic kidney disease), stage III 01/17/2017  . AKI (acute kidney injury) ([place])   . Non-ST elevation (NSTEMI) myocardial infarction ([place])   . CHF (congestive heart failure) ([place]) 09/02/2016  . Elevated serum creatinine   . Encounter for therapeutic drug monitoring 08/03/2016  . Chest pain 04/26/2012  . SVT (supraventricular tachycardia) ([place]) 04/26/2012  . Gastroparesis due to DM ([place]) 11/18/2011  . Nausea and vomiting in adult 11/17/2011  . Coronary artery disease   . Ischemic cardiomyopathy   . Essential hypertension   . Automatic implantable cardioverter-defibrillator in situ   . Deep venous thrombosis ([place])   . Hypersomnolent   . POLYP, COLON 09/25/2008  . DM 09/25/2008  . Dyslipidemia, goal LDL below 70 09/25/2008  . GASTRITIS 09/25/2008  . DIVERTICULOSIS, COLON 09/25/2008  . RECTAL MASS 09/25/2008    Past Surgical History:  Procedure Laterality Date  . APPENDECTOMY    . CARDIAC DEFIBRILLATOR PLACEMENT  2010   [employer]. Jude ICD  . COLECTOMY     "for Crohn's"  . COLON SURGERY    . CORONARY STENT INTERVENTION N/A 11/25/2016   Procedure: Coronary Stent Intervention;  Surgeon: Leonie Man, MD;  Location: [facility];  Service: Cardiovascular;  Laterality: N/A;  . EP IMPLANTABLE DEVICE N/A 09/14/2016   Procedure: ICD Generator Changeout;  Surgeon: Deboraha Sprang, MD;  Location: [facility];  Service: Cardiovascular;  Laterality: N/A;  . FETAL SURGERY FOR CONGENITAL HERNIA    . INGUINAL HERNIA REPAIR    . RIGHT/LEFT HEART CATH AND CORONARY ANGIOGRAPHY N/A 11/23/2016   Procedure: Right/Left Heart Cath and Coronary Angiography;  Surgeon: Larey Dresser, MD;  Location: [facility];  Service: Cardiovascular;  Laterality: N/A;        Home Medications    Prior to Admission medications   Medication Sig Start Date End Date Taking? Authorizing Provider  atorvastatin (LIPITOR) 80 MG tablet Take 1 tablet (80 mg total) by mouth daily at 6 PM. 11/28/16   Tillery, Satira Mccallum, PA-C  carvedilol (COREG) 6.25 MG tablet Take 0.5 tablets (3.125 mg total) by mouth 2 (two) times daily. 02/13/17   Clegg, Amy D, NP  clopidogrel (PLAVIX) 75 MG tablet Take 1 tablet (75 mg total) by mouth daily. 11/29/16   Shirley Friar, PA-C  digoxin (LANOXIN) 0.125 MG tablet Take 1 tablet (0.125 mg total) by mouth daily. 12/13/16   Larey Dresser, MD  gabapentin (NEURONTIN) 300 MG capsule Take 1 capsule (300 mg total) by mouth at bedtime. 03/06/17   [name], MD  hydrALAZINE (APRESOLINE) 25 MG tablet Take 1 tablet (25 mg total) by mouth 3 (three) times daily. 02/13/17   Clegg, Amy D, NP  insulin glargine (LANTUS) 100 UNIT/ML injection Inject 0.37 mLs (37 Units total) into the skin daily. 03/06/17   [name], MD  isosorbide mononitrate (IMDUR) 60 MG 24 hr tablet Take 1.5 tablets (90 mg total) by mouth daily. Patient taking differently: Take 60 mg by mouth daily.  12/13/16   Larey Dresser, MD  metoCLOPramide (REGLAN) 5 MG tablet Take one tablet 1/2 hr before meals. Patient taking differently: Take 5 mg by mouth 3 (three) times daily as needed for nausea or vomiting.  01/10/17   Esterwood, Amy S, PA-C  nitroGLYCERIN (NITROSTAT) 0.4 MG SL tablet Place 1 tablet (0.4 mg total) under the tongue every 5 (five) minutes as needed for chest pain. Patient not taking: Reported on 02/21/2017 07/13/16   Jerline Pain, MD  ondansetron (ZOFRAN ODT) 4 MG disintegrating tablet Take 1 tablet (4 mg total) by mouth every 6 (six) hours as needed for nausea or vomiting. 01/10/17   Esterwood, Amy S, PA-C  ranolazine (RANEXA) 500 MG 12 hr tablet Take 1 tablet (500 mg total) by mouth 2 (two) times daily. 12/14/16   Arbutus Leas, NP  torsemide  (DEMADEX) 20 MG tablet Take 4 tablets (80 mg total) by mouth daily. 03/06/17   Larey Dresser, MD  warfarin (COUMADIN) 5 MG tablet Take as directed by coumadin clinic Patient taking differently: Take 5-7.5 mg by mouth See admin instructions. Take 7.5 mg by mouth on Monday and Saturday. Take 5 mg by mouth on all other days 01/10/17   Deboraha Sprang, MD    Family History Family History  Problem Relation Age of Onset  . Colon polyps Mother   . Diabetes Mother   . Heart attack Father   . Diabetes Father   . Stroke Paternal Grandfather   . Colitis Sister   . Heart disease Brother   . Colitis Sister   . Colon cancer Neg Hx     Social History Social History  Substance Use Topics  .  Smoking status: Never Smoker  . Smokeless tobacco: Never Used  . Alcohol use No     Allergies   Metformin and related   Review of Systems Review of Systems  Constitutional: Negative for fever.  HENT: Negative for sore throat.   Eyes: Negative for visual disturbance.  Respiratory: Positive for shortness of breath. Negative for cough.   Cardiovascular: Positive for chest pain. Negative for leg swelling.  Gastrointestinal: Positive for nausea. Negative for abdominal pain and vomiting.  Endocrine: Negative for polyuria.  Genitourinary: Negative for flank pain.  Musculoskeletal: Negative for back pain and neck pain.  Skin: Negative for rash.  Neurological: Negative for headaches.  Hematological: Does not bruise/bleed easily.  Psychiatric/Behavioral: Negative for confusion.     Physical Exam Updated Vital Signs BP (!) 161/93 (BP Location: Right Arm)   Pulse 91   Temp 98.8 F (37.1 C) (Oral)   Resp 20   SpO2 97%   Physical Exam  Constitutional: He appears well-developed and well-nourished. No distress.  HENT:  Mouth/Throat: Oropharynx is clear and moist.  Eyes: Pupils are equal, round, and reactive to light. Conjunctivae are normal. No scleral icterus.  Neck: Neck supple. No tracheal  deviation present.  Cardiovascular: Normal rate, regular rhythm, normal heart sounds and intact distal pulses.  Exam reveals no gallop and no friction rub.   No murmur heard. Pulmonary/Chest: Effort normal and breath sounds normal. No accessory muscle usage. No respiratory distress.  Abdominal: Soft. Bowel sounds are normal. He exhibits no distension and no mass. There is no tenderness. There is no rebound and no guarding. No hernia.  Genitourinary:  Genitourinary Comments: No cva tenderness  Musculoskeletal: He exhibits no edema.  Neurological: He is alert.  Speech clear/fluent.   Skin: Skin is warm and dry. No rash noted. He is not diaphoretic.  Psychiatric: He has a normal mood and affect.  Nursing note and vitals reviewed.    ED Treatments / Results  Labs (all labs ordered are listed, but only abnormal results are displayed) Results for orders placed or performed during the hospital encounter of 04/05/17  CBC  Result Value Ref Range   WBC 9.4 4.0 - 10.5 K/uL   RBC 4.24 4.22 - 5.81 MIL/uL   Hemoglobin 11.3 (L) 13.0 - 17.0 g/dL   HCT 34.8 (L) 39.0 - 52.0 %   MCV 82.1 78.0 - 100.0 fL   MCH 26.7 26.0 - 34.0 pg   MCHC 32.5 30.0 - 36.0 g/dL   RDW 14.5 11.5 - 15.5 %   Platelets 219 150 - 400 K/uL  I-stat troponin, ED  Result Value Ref Range   Troponin i, poc 0.01 0.00 - 0.08 ng/mL   Comment 3           Dg Chest 2 View  Result Date: 04/05/2017 CLINICAL DATA:  Four days of sharp sub sternal non radiating chest discomfort. Two days of nausea and vomiting with inability to take medications. History of diabetes, hypertension, ischemic cardiomyopathy with dysrhythmia is, previous MI, nonsmoker. EXAM: CHEST  2 VIEW COMPARISON:  PA and lateral chest x-ray of February 11, 2017 FINDINGS: The lungs are mildly hypoinflated. There is a new small right pleural effusion as well as trace left pleural effusion. The cardiac silhouette remains enlarged. The pulmonary vascularity is more engorged and there  is cephalization of the vascular pattern. The ICD is in stable position. The bony thorax is unremarkable. The gas pattern in the upper abdomen is nonspecific. IMPRESSION: CHF with pulmonary vascular congestion  and new small right and trace left pleural effusions. The appearance of the chest has deteriorated since the previous study. Electronically Signed   By: David  Martinique M.D.   On: 04/05/2017 12:15    EKG  EKG Interpretation  Date/Time:  Wednesday April 05 2017 11:35:18 EDT Ventricular Rate:  93 PR Interval:  190 QRS Duration: 82 QT Interval:  378 QTC Calculation: 469 R Axis:   106 Text Interpretation:  Sinus rhythm with occasional Premature ventricular complexes and Fusion complexes Rightward axis Nonspecific T wave abnormality No significant change since last tracing Confirmed by Lajean Saver [id number]) on 04/05/2017 2:00:27 PM       Radiology Dg Chest 2 View  Result Date: 04/05/2017 CLINICAL DATA:  Four days of sharp sub sternal non radiating chest discomfort. Two days of nausea and vomiting with inability to take medications. History of diabetes, hypertension, ischemic cardiomyopathy with dysrhythmia is, previous MI, nonsmoker. EXAM: CHEST  2 VIEW COMPARISON:  PA and lateral chest x-ray of February 11, 2017 FINDINGS: The lungs are mildly hypoinflated. There is a new small right pleural effusion as well as trace left pleural effusion. The cardiac silhouette remains enlarged. The pulmonary vascularity is more engorged and there is cephalization of the vascular pattern. The ICD is in stable position. The bony thorax is unremarkable. The gas pattern in the upper abdomen is nonspecific. IMPRESSION: CHF with pulmonary vascular congestion and new small right and trace left pleural effusions. The appearance of the chest has deteriorated since the previous study. Electronically Signed   By: David  Martinique M.D.   On: 04/05/2017 12:15    Procedures Procedures (including critical care time)  Medications  Ordered in ED Medications  ondansetron (ZOFRAN) injection 4 mg (not administered)     Initial Impression / Assessment and Plan / ED Course  I have reviewed the triage vital signs and the nursing notes.  Pertinent labs & imaging results that were available during my care of the patient were reviewed by me and considered in my medical decision making (see chart for details).  Iv ns. Continuous pulse ox and monitor.   Labs. Cxr.  Pt requests med for nausea. zofran iv.  Reviewed nursing notes and prior charts for additional history.   Lasix iv.   Chemistry, dig level pending ?delay - called lab - they do not have sample.  rn informed - she has re-sent.  Given dyspnea, hx chf, inc vascular cong/chf on cxr - pcp service [facility]) consulted for admission.  Discussed w resident - they will admit.  Discussed with Dr Oleta Mouse that chemistries, bnp, dig pending and to f/u on when back, and that [facility] is coming to admit.       Final Clinical Impressions(s) / ED Diagnoses   Final diagnoses:  None    New Prescriptions New Prescriptions   No medications on file     Lajean Saver, MD 04/05/17 1531    Lajean Saver, MD 04/05/17 1540

## 2017-04-05 NOTE — ED Triage Notes (Signed)
Pt arrives via POv from home with chest discomfort that began Saturday. Pt states pain feels sharp substernal, non- radiating. Began having nausea and vomiting on Monday. Pt reports unable to take daily medications because he can't keep anything down.

## 2017-04-05 NOTE — ED Notes (Signed)
Able to draw CBC and iSTAT. Unable to draw BMP.

## 2017-04-05 NOTE — ED Notes (Signed)
IV team at bedside 

## 2017-04-05 NOTE — ED Notes (Signed)
Lab called and stated official digoxin level is less than 0.2.

## 2017-04-05 NOTE — Progress Notes (Signed)
ANTICOAGULATION CONSULT NOTE - Initial Consult  Pharmacy Consult for Warfarin Indication: Recurrent DVT  Allergies  Allergen Reactions  . Metformin And Related Diarrhea    Patient Measurements:   Heparin Dosing Weight: n/a  Vital Signs: Temp: 98.8 F (37.1 C) (08/01 1133) Temp Source: Oral (08/01 1133) BP: 150/94 (08/01 2130) Pulse Rate: 91 (08/01 2130)  Labs:  Recent Labs  04/05/17 1157 04/05/17 1510  HGB 11.3*  --   HCT 34.8*  --   PLT 219  --   CREATININE  --  1.42*    CrCl cannot be calculated (Unknown ideal weight.).   Medical History: Past Medical History:  Diagnosis Date  . Angina   . ASCVD (arteriosclerotic cardiovascular disease)    , Anterior infarction 2005, LAD diagonal bifurcation intervention 03/2004  . Automatic implantable cardiac defibrillator -[employer]. [employer]       . Benign neoplasm of colon   . CHF (congestive heart failure) ([place])   . Chronic systolic heart failure ([place])   . Coronary artery disease     Widely patent previously placed stents in the left anterior   . Crohn's disease ([place])   . 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 Dyspnea   . Gastroparesis   . GERD (gastroesophageal reflux disease)   . High cholesterol   . Hyperlipidemia   . Hypersomnolent    Previous diagnosis of narcolepsy  . Hypertension, essential   . Ischemic cardiomyopathy    Ejection fraction 15-20% catheterization 2010  . Type II or unspecified type diabetes mellitus without mention of complication, not stated as uncontrolled   . 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 His admission INR is pending. Pharmacy consulted to resume home coumadin therapy.   Home Coumadin dose: 7.5 mg on M/W/F and 5 mg on all other days  Goal of Therapy:  INR 2-3 Monitor platelets by anticoagulation protocol:  Yes  Plan:  -Add coumadin dose when INR results -Monitor daily PT/INR  [name], PharmD., BCPS Clinical Pharmacist Pager [phone]

## 2017-04-05 NOTE — Consult Note (Signed)
Cardiology Consultation:   Patient ID: [name]; [id number]; 11/13/1965   Admit date: 04/05/2017 Date of Consult: 04/05/2017  Primary Care Provider: [name], MD Primary Cardiologist: Dr. Aundra Dubin Primary Electrophysiologist:  Dr. Caryl Comes   Patient Profile:   [name] is a 51 y.o. male with a hx of CAD s/p anterior MI, ICM with baseline EF 25-30% s/p StJ ICD (gen change 09/14/2016), CKD stage III, recurrent DVT, Crohn's disease, HTN, HLD and DM II who is being seen today for the evaluation of acute on chronic systolic heart failure and chest pain at the request of Dr. Nori Riis.  History of Present Illness:   [name] is a 51 yo AA male with PMH of CAD s/p anterior MI, ICM with baseline EF 25-30% s/p StJ ICD (gen change 09/14/2016), CKD stage III, recurrent DVT, Crohn's disease, HTN, HLD and DM II. He was last admitted in March 2018 for low output heart failure. He was started on norepi and milrinone. Echocardiogram obtained on 11/22/2016 showed EF 10-93%, grade 2 diastolic dysfunction, mild to moderate TR, PA peak pressure 55 mmHg. He underwent cardiac catheterization on 11/23/2016 which showed preserved cardiac output on milrinone, 99% ulcerated lesion in the proximal RCA with left to right collaterals, 95% mid LAD lesion after the previous mid LAD stent. This 2 lesions were eventually treated with Synergy DES in each locations on 11/25/2016. Post cath, he was eventually weaned off milrinone on 11/26/2016. He returned to the hospital on 12/05/2016 with chest pain that is similar to his previous presentation, troponin was also trending down well. It was felt not to be a true ACS. He had acute kidney injury on arrival, digoxin and spironolactone were held.  He was last seen by Dr. Aundra Dubin on 02/21/2017 at which time he was on 60 mg daily of torsemide. Since last Friday, he has been having worsening shortness of breath especially worse when he is trying to lay down. Started on  Monday, he had intermittent chest discomfort, increasing shortness of breath, nausea and vomiting and was unable to keep any medications including torsemide down. He eventually sought medical attention at [facility] on 04/05/2017. Initial laboratory finding was significant for creatinine 1.42, BNP 2214, negative troponin. Digoxin level low. Chest x-ray showed CHF with pulmonary vascular congestion, new small right and trace left pleural effusion. He also has diffusely tender abdomen on physical exam, abdominal x-ray showed nonobstructive bowel gas pattern.    Past Medical History:  Diagnosis Date  . Angina   . ASCVD (arteriosclerotic cardiovascular disease)    , Anterior infarction 2005, LAD diagonal bifurcation intervention 03/2004  . Automatic implantable cardiac defibrillator -St. Jude's       . Benign neoplasm of colon   . CHF (congestive heart failure) ([place])   . Chronic systolic heart failure ([place])   . Coronary artery disease     Widely patent previously placed stents in the left anterior   . Crohn's disease ([place])   . 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 Dyspnea   . Gastroparesis   . GERD (gastroesophageal reflux disease)   . High cholesterol   . Hyperlipidemia   . Hypersomnolent    Previous diagnosis of narcolepsy  . Hypertension, essential   . Ischemic cardiomyopathy    Ejection fraction 15-20% catheterization 2010  . Type II or unspecified type diabetes mellitus without mention of complication, not stated as uncontrolled   . Unspecified gastritis and gastroduodenitis without mention of  hemorrhage     Past Surgical History:  Procedure Laterality Date  . APPENDECTOMY    . CARDIAC DEFIBRILLATOR PLACEMENT  2010   St. Jude ICD  . COLECTOMY     "for Crohn's"  . COLON SURGERY    . CORONARY STENT INTERVENTION N/A 11/25/2016   Procedure: Coronary Stent Intervention;  Surgeon: Leonie Man, MD;  Location: [facility];  Service:  Cardiovascular;  Laterality: N/A;  . EP IMPLANTABLE DEVICE N/A 09/14/2016   Procedure: ICD Generator Changeout;  Surgeon: Deboraha Sprang, MD;  Location: [facility];  Service: Cardiovascular;  Laterality: N/A;  . FETAL SURGERY FOR CONGENITAL HERNIA    . INGUINAL HERNIA REPAIR    . RIGHT/LEFT HEART CATH AND CORONARY ANGIOGRAPHY N/A 11/23/2016   Procedure: Right/Left Heart Cath and Coronary Angiography;  Surgeon: Larey Dresser, MD;  Location: [facility];  Service: Cardiovascular;  Laterality: N/A;     Inpatient Medications: Scheduled Meds: . digoxin  0.125 mg Oral Daily   Continuous Infusions:  PRN Meds: metoCLOPramide  Allergies:    Allergies  Allergen Reactions  . Metformin And Related Diarrhea    Social History:   Social History   Social History  . Marital status: Married    Spouse name: N/A  . Number of children: N/A  . Years of education: N/A   Occupational History  . Not on file.   Social History Main Topics  . Smoking status: Never Smoker  . Smokeless tobacco: Never Used  . Alcohol use No  . Drug use: No  . Sexual activity: No   Other Topics Concern  . Not on file   Social History Narrative  . No narrative on file    Family History:   The patient's family history includes Colitis in his sister and sister; Colon polyps in his mother; Diabetes in his father and mother; Heart attack in his father; Heart disease in his brother; Stroke in his paternal grandfather. There is no history of Colon cancer.  ROS:  Please see the history of present illness.  ROS  All other ROS reviewed and negative.     Physical Exam/Data:   Vitals:   04/05/17 1133 04/05/17 1630 04/05/17 1715 04/05/17 1800  BP: (!) 161/93 (!) 160/103 (!) 168/107 (!) 159/103  Pulse: 91 84 86 91  Resp: 20 14 15  (!) 28  Temp: 98.8 F (37.1 C)     TempSrc: Oral     SpO2: 97% 100% 97% 99%   No intake or output data in the 24 hours ending 04/05/17 1846 There were no vitals filed  for this visit. There is no height or weight on file to calculate BMI.  General:  Well nourished, well developed, in no acute distress HEENT: normal Lymph: no adenopathy Neck: no JVD Endocrine:  No thryomegaly Vascular: No carotid bruits; FA pulses 2+ bilaterally without bruits  Cardiac:  normal S1, S2; RRR; no murmur  Lungs:  clear to auscultation bilaterally, no wheezing, rhonchi or rales  Abd: soft, no hepatomegaly  +diffusely tender Ext: no edema Musculoskeletal:  No deformities, BUE and BLE strength normal and equal Skin: warm and dry  Neuro:  CNs 2-12 intact, no focal abnormalities noted Psych:  Normal affect   EKG:  The EKG was personally reviewed and demonstrates:  Normal sinus rhythm without significant ST-T wave changes Telemetry:  Telemetry was personally reviewed and demonstrates:  Normal sinus rhythm with occasional PVCs  Relevant CV Studies:  Echo 11/22/2016  LV EF: 25% -   30%  Study Conclusions  - Left ventricle: The cavity size was normal. Wall thickness was   normal. Systolic function was severely reduced. The estimated   ejection fraction was in the range of 25% to 30%. Severe   hypokinesis of the mid-apicalanteroseptal, inferolateral, and   apical myocardium. Features are consistent with a pseudonormal   left ventricular filling pattern, with concomitant abnormal   relaxation and increased filling pressure (grade 2 diastolic   dysfunction). - Mitral valve: Valve area by continuity equation (using LVOT   flow): 1.49 cm^2. - Right atrium: The atrium was mildly dilated. - Tricuspid valve: There was mild-moderate regurgitation. - Pulmonary arteries: Systolic pressure was moderately increased.   PA peak pressure: 55 mm Hg (S).   Cath 11/25/2016 Conclusion     LESION #1: Dist LAD-1 lesion, 95 %stenosed.  A STENT SYNERGY DES 2.75X28 drug eluting stent was successfully placed, and overlaps previously placed stent.  Post intervention, there is a 0%  residual stenosis.  LESION #2: Prox RCA lesion, 95 %stenosed.  A STENT SYNERGY DES 3.0X32 drug eluting stent was successfully placed,  Post intervention, there is a 0% residual stenosis.  -------------- Additional Findings -------------------  Colon [place] 1st Diag to 1st Diag lesion, 80 %stenosed. 1st Diag CYPHER DES 2.5 X 13, 0 %stenosed.  Ost 2nd Diag to 2nd Diag CYPHER DES 2.5 X 13 & 2.5 X 8, 0 %stenosed.  Mid LAD CYPHER DES 3.0 X 13, 0 %stenosed.  Dist-apical LAD-2 lesion, 55 %stenosed.  Ost Ramus lesion, 80 %stenosed.  Prox Cx lesion, 70 %stenosed. 3rd Mrg lesion, 50 %stenosed.  Mid RCA lesion, 60 %stenosed. Dist RCA lesion, 60 %stenosed. - Borderline significance  Ost RPDA lesion, 60 %stenosed. 3rd RPLB lesion, 60 %stenosed. Too distal for PCI   Successful staged PCI of distal LAD and proximal RCA lesions with DES stents.     Laboratory Data:  Chemistry Recent Labs Lab 04/05/17 1510  NA 137  K 4.2  CL 101  CO2 18*  GLUCOSE 312*  BUN 23*  CREATININE 1.42*  CALCIUM 8.4*  GFRNONAA 55*  GFRAA >60  ANIONGAP 18*    No results for input(s): PROT, ALBUMIN, AST, ALT, ALKPHOS, BILITOT in the last 168 hours. Hematology Recent Labs Lab 04/05/17 1157  WBC 9.4  RBC 4.24  HGB 11.3*  HCT 34.8*  MCV 82.1  MCH 26.7  MCHC 32.5  RDW 14.5  PLT 219   Cardiac EnzymesNo results for input(s): TROPONINI in the last 168 hours.  Recent Labs Lab 04/05/17 1211  TROPIPOC 0.01    BNP Recent Labs Lab 04/05/17 1510  BNP 2,214.8*    DDimer No results for input(s): DDIMER in the last 168 hours.  Radiology/Studies:  Dg Chest 2 View  Result Date: 04/05/2017 CLINICAL DATA:  Four days of sharp sub sternal non radiating chest discomfort. Two days of nausea and vomiting with inability to take medications. History of diabetes, hypertension, ischemic cardiomyopathy with dysrhythmia is, previous MI, nonsmoker. EXAM: CHEST  2 VIEW COMPARISON:  PA and lateral chest x-ray of February 11, 2017  FINDINGS: The lungs are mildly hypoinflated. There is a new small right pleural effusion as well as trace left pleural effusion. The cardiac silhouette remains enlarged. The pulmonary vascularity is more engorged and there is cephalization of the vascular pattern. The ICD is in stable position. The bony thorax is unremarkable. The gas pattern in the upper abdomen is nonspecific. IMPRESSION: CHF with pulmonary vascular congestion and new  small right and trace left pleural effusions. The appearance of the chest has deteriorated since the previous study. Electronically Signed   By: David  Martinique M.D.   On: 04/05/2017 12:15   Dg Abd 2 Views  Result Date: 04/05/2017 CLINICAL DATA:  Nausea and vomiting. EXAM: ABDOMEN - 2 VIEW COMPARISON:  11/17/2011 abdominal radiograph. 02/11/2008 CT abdomen and pelvis. FINDINGS: There is no evidence of intraperitoneal free air. Scattered colonic gas is present (including in sigmoid colon which courses into the right lower quadrant). No dilated loops of bowel are seen to suggest obstruction. An ICD is partially visualized. Lung findings, including a small right pleural effusion, are more fully evaluated on today's earlier chest radiographs. IMPRESSION: Nonobstructed bowel gas pattern. Electronically Signed   By: Logan Bores M.D.   On: 04/05/2017 17:04    Assessment and Plan:   1. Acute on chronic systolic heart failure: Received 40 mg IV Lasix in the ED. Good urinary output. BNP over 2000. Continued to have bibasilar decreased breath sound on physical exam, however does not appears to be significantly volume overloaded on physical exam. Does have mildly elevated JVD, however likely related to previous mild to moderate TR on echocardiogram. Soma which likely related to recent nausea and vomiting and he hasn't been able to keep any medication down.   - Will discuss with M.D., his recent symptom concerning for acute on chronic systolic heart failure, he is on 80 minute one daily  of torsemide at home, he only received 40 mg daily of Lasix here. May consider doing a trial of 80 mg twice a day of IV Lasix for 2 days to see if his symptom will improve.  2. Chest pain: He has been having intermittent chest pain recently, we'll reassess based on serial troponin. If his symptoms improve after diuresis, and his serial troponin is negative, then I would not recommend any further workup.  3. Diffuse abdominal pain: Abdomen soft on physical exam, no focal tenderness, more diffuse in nature. Abdominal x-ray was negative. History of Crohn's disease  4. CAD s/p DES to RCA and LAD in March 2018:    5. ICM with baseline EF 25-30% s/p StJ ICD (gen change 09/14/2016): Per patient, he is any remote transmission, however I was unable to pull this up.  6. CKD stage III: Renal function stable  7. recurrent DVT: on coumadin  8. HTN: Blood pressure elevated, however he has not been able to keep any medication down recently due to nausea and vomiting for the past 3 days.  9. HLD: Continue Lipitor  10. DM II: Managed by internal medicine service   Signed, Almyra Deforest, PA  04/05/2017 6:46 PM  I have seen and examined the patient along with Almyra Deforest, PA , PA NP.  I have reviewed the chart, notes and new data.  I agree with PA/NP's note.  Key new complaints: actively retching and vomiting, also has ongoing chest discomfort Key examination changes: cool fingers and feet, mentating well, looks uncomfortable; JVP to angle of jaw; S3 present, loud P2; clear lungs, no edema. BP is high, especially DBP Key new findings / data: creat at/close to baseline, BNP markedly elevated from baseline. ECG and troponin do not suggest acute coronary syndrome.  PLAN: Seems to be "cold and wet", but with elevated DBP, c/w excessive elevation in SVR. He has evidence of volume overload, but mostly left heart failure signs, not massive excess volume (weight up 6-7 lb from recent office evaluations). Exam also  suggests significant PAH. Abdominal pain may be due to low cardiac output state, but need to keep in mind possibility of a primary abdominal problem as well. IV nitroglycerin, IV loop diuretics. May need IV milrinone if he does not turn around in next several hours, if renal function deteriorates or urine output falls or other signs of low CO. If that happens, plan PICC line.   Sanda Klein, MD, [place] [phone] 04/05/2017, 7:33 PM

## 2017-04-05 NOTE — ED Notes (Signed)
Pt given ice chips per Hassan Rowan, Therapist, sports.

## 2017-04-05 NOTE — H&P (Signed)
[facility] Admission History and Physical Service Pager: [phone]  Patient name: [name] Medical record number: [id number] Date of birth: 12-11-1965 Age: 51 y.o. Gender: male  Primary Care Provider: [name], MD Consultants: cardiology Code Status: full (per discussion on admission)  Chief Complaint: nausea/vomiting x3 days  Assessment and Plan: [name] is a 51 y.o. male presenting with chest pain, nausea/vomiting x 3 days. PMH is significant for CHF (EF25-30%), NSTEMI, CKD stage 3, Chrohns, DVTs, colectomy/appendectomy/hernia repair, neoplasm of colon, hypercholesterolemia  Chest Pain, Nausea, Vomiting: Vitals showing hypertension to 160/103, otherwise vital signs are stable and patient is 100% oxygen saturation on room air. Physical exam showing trace edema in extremities and mild crackles at the lung bases otherwise surprisingly stable pulmonary exam. Labs significant for a bicarbonate of 18, creatinine of 1.42, BUN/creatinine 23, BNP 2214.8. Digoxin level is low at <0.2.Troponin negative 1. EKG not showing any acute ST changes. Chest x-ray showing signs of pulmonary vascular congestion with small right pleural effusion and trace left pleural effusion. 40 mg IV Lasix given in ED. Of note patient recently admitted in June 2018 for similar symptoms and was found to have a CHF exacerbation. He was admitted to the advanced heart failure at that time. Last echocardiogram done in March showing EF 25-30%.Patient likely having CHF exacerbation again versus ACS event. Apparently per patient nausea and vomiting is very common for him especially if he has any pulmonary fluid overload. States his dry weight 180-185. HEART Score of 5.  -Admit to family practice teaching service, telemetry, attending Dr. Nori Riis -Vital signs per floor protocol -Continuous cardiac monitoring -Pulse oximetry with vitals -IV Lasix 80 mg twice a day  -Continue  home cardiac medications as below except for torsemide -Strict I's and O's -Daily weights -Cardiology consulted, appreciate their assistance -Trend troponins - Daily EKG - Continue home Reglan and start Phenergan when necessary -Abd xr lying/sitting -clear liquid diet for now, can advance as tolerated -added LFTs to BMP (pending)  T2DM- takes 37 units of Lantus daily. Last hemoglobin A1c in 02/09/2017; 7.5 - decrease home dose to 30 lantus daily -SSI   CKD stage 3: Baseline creatinine appears to be around 1.32- 2.2 (difficult to determine as he has had variable creatinine this year) however last recorded creatinine prior to this admission was 1.46 in June 2018. -Daily BMP -Attempt to avoid nephrotoxic agents, we will need to give him Lasix though  Hypertension: Hypertensive on admission to 160/103 -Continue home Coreg, hydralazine, Imdur, digoxin -Holding home torsemide and starting IV Lasix as above -Appreciate cardiology's recommendations  Significant Cardiac Hx: CAD s/p anterior MI in 2010 and recent staged PCI to distal LAD and proximal RCA in march 2018, ICM, chronic systolic CHF (EF 59-74%) in March 2018. -Cardiology consulted as above, appreciate their recommendations -Telemetry as above -Troponins  Significant GI history: History of Crohn's disease, colectomy, diverticulosis, rectal polyp, and gastroparesis (secondary to diabetes). Hx of chronic nausea and vomiting.  -Continue home Reglan -Add Phenergan when necessary -Abdominal imaging as above  History of recurrent DVTs: On Coumadin at home -Coumadin per pharmacy   Hyperlipidemia:  - Continue home atorvastatin  FEN/GI: clear liquids Prophylaxis: coumadin per pharmacy, PT/INR ordered  Disposition: home pending Cardiology clearance and tolerating PO intake  History of Present Illness:  [name] is a 51 y.o. male presenting with 3 days nausea and vomiting and fluid overload per his heart  monitor.  Patient notes that his "fluid level  was too high" this weekend and he "sent a transmission" that it was high but didn't hear back to his cardiology office. The chest pain, nausea and vomiting started on Monday and hasn't been able to keep anything down since then. Chest pain is centralized, non radiating with a non productive cough, pain is not reproducable with palpation.  He endorses orthopnea with sitting almost 90 degrees upright. Denies any abdominal pain or constipation. Admits to diarrhea 3-4 times a day which is normal for him. Doesn't think he's having a chron's flair up.  Denies any sick contacts or fevers. Patient is requesting Ativan and states he usually gets that when he's here. Drinks about 1L per day and has been consistent with prescribed medications.   Baseline weight 180-185 if he is not holding onto any fluids.  Denies any recent sick contacts, travel, food changes, or medications/substance use that is not prescribed.   Review Of Systems: Per HPI with the following additions: none  ROS  Patient Active Problem List   Diagnosis Date Noted  . Type 2 diabetes mellitus with complication, with long-term current use of insulin ([place]) 03/08/2017  . Acute on chronic systolic (congestive) heart failure ([place]) 02/08/2017  . CKD (chronic kidney disease), stage III 01/17/2017  . AKI (acute kidney injury) ([place])   . Non-ST elevation (NSTEMI) myocardial infarction ([place])   . CHF (congestive heart failure) ([place]) 09/02/2016  . Elevated serum creatinine   . Encounter for therapeutic drug monitoring 08/03/2016  . Chest pain 04/26/2012  . SVT (supraventricular tachycardia) ([place]) 04/26/2012  . Gastroparesis due to DM ([place]) 11/18/2011  . Nausea and vomiting in adult 11/17/2011  . Coronary artery disease   . Ischemic cardiomyopathy   . Essential hypertension   . Automatic implantable cardioverter-defibrillator in situ   . Deep venous thrombosis ([place])   . Hypersomnolent   . POLYP,  COLON 09/25/2008  . DM 09/25/2008  . Dyslipidemia, goal LDL below 70 09/25/2008  . GASTRITIS 09/25/2008  . DIVERTICULOSIS, COLON 09/25/2008  . RECTAL MASS 09/25/2008    Past Medical History: Past Medical History:  Diagnosis Date  . Angina   . ASCVD (arteriosclerotic cardiovascular disease)    , Anterior infarction 2005, LAD diagonal bifurcation intervention 03/2004  . Automatic implantable cardiac defibrillator -St. Jude's       . Benign neoplasm of colon   . CHF (congestive heart failure) ([place])   . Chronic systolic heart failure ([place])   . Coronary artery disease     Widely patent previously placed stents in the left anterior   . Crohn's disease ([place])   . 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 Dyspnea   . Gastroparesis   . GERD (gastroesophageal reflux disease)   . High cholesterol   . Hyperlipidemia   . Hypersomnolent    Previous diagnosis of narcolepsy  . Hypertension, essential   . Ischemic cardiomyopathy    Ejection fraction 15-20% catheterization 2010  . Type II or unspecified type diabetes mellitus without mention of complication, not stated as uncontrolled   . Unspecified gastritis and gastroduodenitis without mention of hemorrhage     Past Surgical History: Past Surgical History:  Procedure Laterality Date  . APPENDECTOMY    . CARDIAC DEFIBRILLATOR PLACEMENT  2010   St. Jude ICD  . COLECTOMY     "for Crohn's"  . COLON SURGERY    . CORONARY STENT INTERVENTION N/A 11/25/2016   Procedure: Coronary Stent Intervention;  Surgeon: Leonie Man, MD;  Location: [facility];  Service: Cardiovascular;  Laterality: N/A;  . EP IMPLANTABLE DEVICE N/A 09/14/2016   Procedure: ICD Generator Changeout;  Surgeon: Deboraha Sprang, MD;  Location: [facility];  Service: Cardiovascular;  Laterality: N/A;  . FETAL SURGERY FOR CONGENITAL HERNIA    . INGUINAL HERNIA REPAIR    . RIGHT/LEFT HEART CATH AND CORONARY ANGIOGRAPHY N/A 11/23/2016    Procedure: Right/Left Heart Cath and Coronary Angiography;  Surgeon: Larey Dresser, MD;  Location: [facility];  Service: Cardiovascular;  Laterality: N/A;    Social History: Social History  Substance Use Topics  . Smoking status: Never Smoker  . Smokeless tobacco: Never Used  . Alcohol use No   Please also refer to relevant sections of EMR.  Family History: Family History  Problem Relation Age of Onset  . Colon polyps Mother   . Diabetes Mother   . Heart attack Father   . Diabetes Father   . Stroke Paternal Grandfather   . Colitis Sister   . Heart disease Brother   . Colitis Sister   . Colon cancer Neg Hx     Allergies and Medications: Allergies  Allergen Reactions  . Metformin And Related Diarrhea   No current facility-administered medications on file prior to encounter.    Current Outpatient Prescriptions on File Prior to Encounter  Medication Sig Dispense Refill  . atorvastatin (LIPITOR) 80 MG tablet Take 1 tablet (80 mg total) by mouth daily at 6 PM. 30 tablet 6  . carvedilol (COREG) 6.25 MG tablet Take 0.5 tablets (3.125 mg total) by mouth 2 (two) times daily. 60 tablet 0  . clopidogrel (PLAVIX) 75 MG tablet Take 1 tablet (75 mg total) by mouth daily. 30 tablet 6  . digoxin (LANOXIN) 0.125 MG tablet Take 1 tablet (0.125 mg total) by mouth daily. 90 tablet 3  . gabapentin (NEURONTIN) 300 MG capsule Take 1 capsule (300 mg total) by mouth at bedtime. 30 capsule 1  . hydrALAZINE (APRESOLINE) 25 MG tablet Take 1 tablet (25 mg total) by mouth 3 (three) times daily. 90 tablet 6  . insulin glargine (LANTUS) 100 UNIT/ML injection Inject 0.37 mLs (37 Units total) into the skin daily. 10 mL 11  . isosorbide mononitrate (IMDUR) 60 MG 24 hr tablet Take 1.5 tablets (90 mg total) by mouth daily. (Patient taking differently: Take 60 mg by mouth daily. ) 45 tablet 6  . metoCLOPramide (REGLAN) 5 MG tablet Take one tablet 1/2 hr before meals. (Patient taking differently: Take 5  mg by mouth 3 (three) times daily as needed for nausea or vomiting. ) 90 tablet 2  . nitroGLYCERIN (NITROSTAT) 0.4 MG SL tablet Place 1 tablet (0.4 mg total) under the tongue every 5 (five) minutes as needed for chest pain. (Patient not taking: Reported on 02/21/2017) 25 tablet 3  . ondansetron (ZOFRAN ODT) 4 MG disintegrating tablet Take 1 tablet (4 mg total) by mouth every 6 (six) hours as needed for nausea or vomiting. 40 tablet 1  . ranolazine (RANEXA) 500 MG 12 hr tablet Take 1 tablet (500 mg total) by mouth 2 (two) times daily. 60 tablet 3  . torsemide (DEMADEX) 20 MG tablet Take 4 tablets (80 mg total) by mouth daily. 360 tablet 3  . warfarin (COUMADIN) 5 MG tablet Take as directed by coumadin clinic (Patient taking differently: Take 5-7.5 mg by mouth See admin instructions. Take 7.5 mg by mouth on Monday and Saturday. Take 5 mg by mouth on  all other days) 50 tablet 1    Objective: BP (!) 161/93 (BP Location: Right Arm)   Pulse 91   Temp 98.8 F (37.1 C) (Oral)   Resp 20   SpO2 97%  Exam: General: uncomfortable, vomiting/wretching throughout exam Eyes: PERRLA, non icteric sclera Cardiovascular: RRR, systolic murmur, peripheral pulses 2+ bilaterally Respiratory: normal work of breathing, no obvious wheezes/rhonchi, faint crackles at bases Gastrointestinal: soft belly to exam, no tenderness to deep palpation, mulitple well healed surgical scars, hypoactive bowel sounds MSK: 1+ pitting edema on lower extremities bilaterally,  Neuro: alert and oriented x3, moves upper and lower extremities spontaneously, motor control and sensation grossly intact, no deficits noted, gait not observed Psych: appropriate mood and affect  Labs and Imaging: CBC BMET   Recent Labs Lab 04/05/17 1157  WBC 9.4  HGB 11.3*  HCT 34.8*  PLT 219    Recent Labs Lab 04/05/17 1510  NA 137  K 4.2  CL 101  CO2 18*  BUN 23*  CREATININE 1.42*  GLUCOSE 312*  CALCIUM 8.4*     Digoxin, serum 1.0 BNP  2214.8 Troponin .01   Dg Chest 2 View  Result Date: 04/05/2017 CLINICAL DATA:  Four days of sharp sub sternal non radiating chest discomfort. Two days of nausea and vomiting with inability to take medications. History of diabetes, hypertension, ischemic cardiomyopathy with dysrhythmia is, previous MI, nonsmoker. EXAM: CHEST  2 VIEW COMPARISON:  PA and lateral chest x-ray of February 11, 2017 FINDINGS: The lungs are mildly hypoinflated. There is a new small right pleural effusion as well as trace left pleural effusion. The cardiac silhouette remains enlarged. The pulmonary vascularity is more engorged and there is cephalization of the vascular pattern. The ICD is in stable position. The bony thorax is unremarkable. The gas pattern in the upper abdomen is nonspecific. IMPRESSION: CHF with pulmonary vascular congestion and new small right and trace left pleural effusions. The appearance of the chest has deteriorated since the previous study. Electronically Signed   By: David  Martinique M.D.   On: 04/05/2017 12:15   Dg Abd 2 Views  Result Date: 04/05/2017 CLINICAL DATA:  Nausea and vomiting. EXAM: ABDOMEN - 2 VIEW COMPARISON:  11/17/2011 abdominal radiograph. 02/11/2008 CT abdomen and pelvis. FINDINGS: There is no evidence of intraperitoneal free air. Scattered colonic gas is present (including in sigmoid colon which courses into the right lower quadrant). No dilated loops of bowel are seen to suggest obstruction. An ICD is partially visualized. Lung findings, including a small right pleural effusion, are more fully evaluated on today's earlier chest radiographs. IMPRESSION: Nonobstructed bowel gas pattern. Electronically Signed   By: Logan Bores M.D.   On: 04/05/2017 17:04    Sherene Sires, DO 04/05/2017, 4:43 PM PGY-1, [place] Intern pager: [phone], text pages welcome   I have separately seen and examined the patient. I have discussed the findings and exam with Dr. Criss Rosales and agree with  the above note.  My changes/additions are outlined in BLUE.   Smitty Cords, MD [place], PGY-3

## 2017-04-06 ENCOUNTER — Inpatient Hospital Stay (HOSPITAL_COMMUNITY): Payer: BLUE CROSS/BLUE SHIELD

## 2017-04-06 DIAGNOSIS — R1110 Vomiting, unspecified: Secondary | ICD-10-CM

## 2017-04-06 DIAGNOSIS — E0810 Diabetes mellitus due to underlying condition with ketoacidosis without coma: Secondary | ICD-10-CM

## 2017-04-06 DIAGNOSIS — I340 Nonrheumatic mitral (valve) insufficiency: Secondary | ICD-10-CM

## 2017-04-06 DIAGNOSIS — R110 Nausea: Secondary | ICD-10-CM

## 2017-04-06 DIAGNOSIS — N179 Acute kidney failure, unspecified: Secondary | ICD-10-CM

## 2017-04-06 LAB — COOXEMETRY PANEL
CARBOXYHEMOGLOBIN: 1.5 % (ref 0.5–1.5)
Methemoglobin: 0.9 % (ref 0.0–1.5)
O2 Saturation: 59 %
Total hemoglobin: 11.2 g/dL — ABNORMAL LOW (ref 12.0–16.0)

## 2017-04-06 LAB — MRSA PCR SCREENING: MRSA by PCR: NEGATIVE

## 2017-04-06 LAB — GLUCOSE, CAPILLARY
GLUCOSE-CAPILLARY: 190 mg/dL — AB (ref 65–99)
GLUCOSE-CAPILLARY: 179 mg/dL — AB (ref 65–99)
Glucose-Capillary: 167 mg/dL — ABNORMAL HIGH (ref 65–99)

## 2017-04-06 LAB — BASIC METABOLIC PANEL
ANION GAP: 11 (ref 5–15)
ANION GAP: 15 (ref 5–15)
BUN: 30 mg/dL — ABNORMAL HIGH (ref 6–20)
BUN: 31 mg/dL — AB (ref 6–20)
CHLORIDE: 104 mmol/L (ref 101–111)
CHLORIDE: 100 mmol/L — AB (ref 101–111)
CO2: 23 mmol/L (ref 22–32)
CO2: 24 mmol/L (ref 22–32)
CREATININE: 1.69 mg/dL — AB (ref 0.61–1.24)
Calcium: 8.2 mg/dL — ABNORMAL LOW (ref 8.9–10.3)
Calcium: 8.5 mg/dL — ABNORMAL LOW (ref 8.9–10.3)
Creatinine, Ser: 1.63 mg/dL — ABNORMAL HIGH (ref 0.61–1.24)
GFR calc Af Amer: 54 mL/min — ABNORMAL LOW (ref >60)
GFR calc non Af Amer: 45 mL/min — ABNORMAL LOW (ref >60)
GFR calc non Af Amer: 47 mL/min — ABNORMAL LOW (ref >60)
GFR, EST AFRICAN AMERICAN: 52 mL/min — AB (ref >60)
GLUCOSE: 159 mg/dL — AB (ref 65–99)
Glucose, Bld: 167 mg/dL — ABNORMAL HIGH (ref 65–99)
POTASSIUM: 3.5 mmol/L (ref 3.5–5.1)
POTASSIUM: 3.6 mmol/L (ref 3.5–5.1)
SODIUM: 138 mmol/L (ref 135–145)
Sodium: 139 mmol/L (ref 135–145)

## 2017-04-06 LAB — CBG MONITORING, ED
GLUCOSE-CAPILLARY: 152 mg/dL — AB (ref 65–99)
GLUCOSE-CAPILLARY: 153 mg/dL — AB (ref 65–99)
GLUCOSE-CAPILLARY: 245 mg/dL — AB (ref 65–99)
GLUCOSE-CAPILLARY: 306 mg/dL — AB (ref 65–99)
GLUCOSE-CAPILLARY: 326 mg/dL — AB (ref 65–99)
Glucose-Capillary: 97 mg/dL (ref 65–99)
Glucose-Capillary: 108 mg/dL — ABNORMAL HIGH (ref 65–99)
Glucose-Capillary: 130 mg/dL — ABNORMAL HIGH (ref 65–99)

## 2017-04-06 LAB — TROPONIN I
Troponin I: 0.03 ng/mL (ref <0.03)
Troponin I: <0.03 ng/mL (ref <0.03)

## 2017-04-06 LAB — CBC
HEMATOCRIT: 31.1 % — AB (ref 39.0–52.0)
Hemoglobin: 10.1 g/dL — ABNORMAL LOW (ref 13.0–17.0)
MCH: 26.4 pg (ref 26.0–34.0)
MCHC: 32.5 g/dL (ref 30.0–36.0)
MCV: 81.4 fL (ref 78.0–100.0)
PLATELETS: 248 10*3/uL (ref 150–400)
RBC: 3.82 MIL/uL — AB (ref 4.22–5.81)
RDW: 14.4 % (ref 11.5–15.5)
WBC: 10.1 10*3/uL (ref 4.0–10.5)

## 2017-04-06 LAB — ECHOCARDIOGRAM COMPLETE
HEIGHTINCHES: 67 in
WEIGHTICAEL: 3030 [oz_av]

## 2017-04-06 LAB — PROTIME-INR
INR: 1.31
Prothrombin Time: 16.4 seconds — ABNORMAL HIGH (ref 11.4–15.2)

## 2017-04-06 MED ORDER — DIPHENHYDRAMINE HCL 50 MG/ML IJ SOLN: 25.0000 mg | Freq: Three times a day (TID) | INTRAMUSCULAR | Status: DC | PRN

## 2017-04-06 MED ORDER — LORAZEPAM 2 MG/ML IJ SOLN
0.5000 mg | Freq: Three times a day (TID) | INTRAMUSCULAR | Status: DC
  Administered 2017-04-06 – 2017-04-09 (×8): 0.5 mg via INTRAVENOUS
  Filled 2017-04-06 (×8): qty 1

## 2017-04-06 MED ORDER — ENOXAPARIN SODIUM 150 MG/ML ~~LOC~~ SOLN
1.5000 mg/kg | SUBCUTANEOUS | Status: DC
  Filled 2017-04-06: qty 0.86

## 2017-04-06 MED ORDER — FUROSEMIDE 10 MG/ML IJ SOLN
80.0000 mg | Freq: Three times a day (TID) | INTRAMUSCULAR | Status: DC
  Administered 2017-04-06 – 2017-04-07 (×4): 80 mg via INTRAVENOUS
  Filled 2017-04-06 (×3): qty 8

## 2017-04-06 MED ORDER — SODIUM CHLORIDE 0.9% FLUSH
10.0000 mL | Freq: Two times a day (BID) | INTRAVENOUS | Status: DC
  Administered 2017-04-06: 40 mL
  Administered 2017-04-06 – 2017-04-07 (×2): 10 mL
  Administered 2017-04-07: 40 mL
  Administered 2017-04-08 – 2017-04-09 (×4): 10 mL
  Administered 2017-04-10: 20 mL
  Administered 2017-04-11 – 2017-04-12 (×2): 10 mL

## 2017-04-06 MED ORDER — INSULIN GLARGINE 100 UNIT/ML ~~LOC~~ SOLN
18.0000 [IU] | SUBCUTANEOUS | Status: DC
  Filled 2017-04-06: qty 0.18

## 2017-04-06 MED ORDER — INSULIN ASPART 100 UNIT/ML ~~LOC~~ SOLN
0.0000 [IU] | Freq: Three times a day (TID) | SUBCUTANEOUS | Status: DC
  Administered 2017-04-06 – 2017-04-07 (×3): 2 [IU] via SUBCUTANEOUS
  Administered 2017-04-07: 1 [IU] via SUBCUTANEOUS
  Administered 2017-04-08 (×2): 3 [IU] via SUBCUTANEOUS
  Administered 2017-04-08 – 2017-04-09 (×4): 2 [IU] via SUBCUTANEOUS
  Administered 2017-04-10: 5 [IU] via SUBCUTANEOUS
  Administered 2017-04-10: 2 [IU] via SUBCUTANEOUS
  Administered 2017-04-11: 1 [IU] via SUBCUTANEOUS
  Administered 2017-04-11: 3 [IU] via SUBCUTANEOUS
  Administered 2017-04-11: 1 [IU] via SUBCUTANEOUS
  Administered 2017-04-12: 2 [IU] via SUBCUTANEOUS
  Administered 2017-04-12: 3 [IU] via SUBCUTANEOUS
  Administered 2017-04-12: 2 [IU] via SUBCUTANEOUS
  Administered 2017-04-13: 3 [IU] via SUBCUTANEOUS
  Administered 2017-04-13: 2 [IU] via SUBCUTANEOUS

## 2017-04-06 MED ORDER — PROMETHAZINE HCL 25 MG/ML IJ SOLN
12.5000 mg | Freq: Four times a day (QID) | INTRAMUSCULAR | Status: DC | PRN
  Administered 2017-04-06 (×2): 12.5 mg via INTRAVENOUS
  Filled 2017-04-06 (×2): qty 1

## 2017-04-06 MED ORDER — ONDANSETRON HCL 4 MG/2ML IJ SOLN
4.0000 mg | Freq: Four times a day (QID) | INTRAMUSCULAR | Status: DC | PRN
  Administered 2017-04-06 – 2017-04-10 (×3): 4 mg via INTRAVENOUS
  Filled 2017-04-06 (×3): qty 2

## 2017-04-06 MED ORDER — INSULIN ASPART 100 UNIT/ML ~~LOC~~ SOLN: 0.0000 [IU] | Freq: Three times a day (TID) | SUBCUTANEOUS | Status: DC

## 2017-04-06 MED ORDER — WARFARIN SODIUM 5 MG PO TABS
10.0000 mg | ORAL_TABLET | ORAL | Status: DC
  Filled 2017-04-06: qty 1

## 2017-04-06 MED ORDER — LORAZEPAM 2 MG/ML IJ SOLN
0.5000 mg | Freq: Once | INTRAMUSCULAR | Status: AC
  Administered 2017-04-06: 0.5 mg via INTRAVENOUS
  Filled 2017-04-06: qty 1

## 2017-04-06 MED ORDER — ENOXAPARIN SODIUM 150 MG/ML ~~LOC~~ SOLN
1.5000 mg/kg | SUBCUTANEOUS | Status: DC
  Administered 2017-04-06 – 2017-04-10 (×5): 130 mg via SUBCUTANEOUS
  Filled 2017-04-06 (×5): qty 0.86

## 2017-04-06 MED ORDER — SODIUM CHLORIDE 0.9% FLUSH
10.0000 mL | INTRAVENOUS | Status: DC | PRN
  Administered 2017-04-06: 10 mL
  Administered 2017-04-11: 20 mL
  Administered 2017-04-12: 10 mL
  Filled 2017-04-06 (×3): qty 40

## 2017-04-06 MED ORDER — MAGNESIUM SULFATE 2 GM/50ML IV SOLN
2.0000 g | Freq: Once | INTRAVENOUS | Status: AC
  Administered 2017-04-06: 2 g via INTRAVENOUS
  Filled 2017-04-06: qty 50

## 2017-04-06 MED ORDER — WARFARIN - PHARMACIST DOSING INPATIENT: Freq: Every day | Status: DC

## 2017-04-06 MED ORDER — METOCLOPRAMIDE HCL 5 MG/ML IJ SOLN
5.0000 mg | Freq: Three times a day (TID) | INTRAMUSCULAR | Status: DC
  Administered 2017-04-06 – 2017-04-11 (×15): 5 mg via INTRAVENOUS
  Filled 2017-04-06 (×15): qty 2

## 2017-04-06 MED ORDER — ONDANSETRON HCL 4 MG/2ML IJ SOLN: 4.0000 mg | Freq: Four times a day (QID) | INTRAMUSCULAR | Status: DC

## 2017-04-06 MED ORDER — ENOXAPARIN SODIUM 150 MG/ML ~~LOC~~ SOLN
130.0000 mg | SUBCUTANEOUS | Status: DC
  Filled 2017-04-06: qty 0.87

## 2017-04-06 MED ORDER — ENOXAPARIN SODIUM 150 MG/ML ~~LOC~~ SOLN: 1.5000 mg/kg | SUBCUTANEOUS | Status: DC

## 2017-04-06 NOTE — ED Notes (Signed)
Pt noted to be 87% on room air. Pt placed on 2L of O2 via nasal cannula. SPO2% improved to 100%.

## 2017-04-06 NOTE — Progress Notes (Signed)
ANTICOAGULATION CONSULT NOTE  Pharmacy Consult for Warfarin Indication: Recurrent DVT  Allergies  Allergen Reactions  . Metformin And Related Diarrhea    Patient Measurements: Height: 5' 8"  (172.7 cm) Weight: 185 lb (83.9 kg) IBW/kg (Calculated) : 68.4  Vital Signs: BP: 129/86 (08/02 0730) Pulse Rate: 91 (08/02 0730)  Labs:  Recent Labs  04/05/17 1157 04/05/17 1510 04/05/17 2232 04/05/17 2234 04/06/17 0404  HGB 11.3*  --   --   --  10.1*  HCT 34.8*  --   --   --  31.1*  PLT 219  --   --   --  248  LABPROT  --   --   --   --  16.4*  INR  --   --   --   --  1.31  CREATININE  --  1.42* 1.55*  --  1.69*  TROPONINI  --   --   --  <0.03 0.03*    Estimated Creatinine Clearance: 54 mL/min (A) (by C-G formula based on SCr of 1.69 mg/dL (H)).  Assessment: 58 YOM with history of recurrent DVT on warfarin. Unable to get INR last night but this morning it is subtherapeutic at 1.31. H/H is low and platelets are WNL.   Home Coumadin dose: 7.5 mg on M/Sa and 5 mg on all other days  Goal of Therapy:  INR 2-3 Monitor platelets by anticoagulation protocol: Yes  Plan:  Warfarin 85m PO x 1 now Daily INR  RSalome Arnt PharmD, BCPS 04/06/2017 8:02 AM

## 2017-04-06 NOTE — ED Notes (Signed)
cbg was 152

## 2017-04-06 NOTE — Progress Notes (Signed)
Call placed to Dr @ [phone] @ patient still nauseated unable to take po meds and requesting Ativan.  Dr Grandville Silos ( Intern) stated she would talk with attending and let me know orders.

## 2017-04-06 NOTE — Progress Notes (Addendum)
Patient ID: [name], male   DOB: 1966-03-24, 51 y.o.   MRN: [id number]     Advanced Heart Failure Rounding Note  Primary Cardiologist: Aundra Dubin  Subjective:    Patient continues to have significant dyspnea and nausea with vomiting.  Nausea is very prominent this morning, Zofran has not helped.  Appears uncomfortable and anxious.   Creatinine mildly higher today to 1.69.   Admitted with DKA, anion gap now closed with most recent glucose 130.   He is on NTG gtt at 30.   Objective:   Weight Range: 185 lb (83.9 kg) Body mass index is 28.13 kg/m.   Vital Signs:   Temp:  [98.8 F (37.1 C)] 98.8 F (37.1 C) (08/01 1133) Pulse Rate:  [77-96] 83 (08/02 0700) Resp:  [11-28] 12 (08/02 0700) BP: (104-168)/(71-107) 139/97 (08/02 0700) SpO2:  [93 %-100 %] 100 % (08/02 0700) Weight:  [185 lb (83.9 kg)] 185 lb (83.9 kg) (08/01 2203)    Weight change: Filed Weights   04/05/17 2203  Weight: 185 lb (83.9 kg)    Intake/Output:   Intake/Output Summary (Last 24 hours) at 04/06/17 0735 Last data filed at 04/06/17 0617  Gross per 24 hour  Intake           563.67 ml  Output              975 ml  Net          -411.33 ml      Physical Exam    General:  Well appearing. No resp difficulty HEENT: Normal Neck: Supple. JVP 14+ cm. Carotids 2+ bilat; no bruits. No lymphadenopathy or thyromegaly appreciated. Cor: PMI nondisplaced. Regular rate & rhythm. No murmur. +soft S3.  Lungs: Clear bilaterally.  Abdomen: Soft, mild peri-umbilical tenderness, nondistended. No hepatosplenomegaly. No bruits or masses. Good bowel sounds. Extremities: No cyanosis, clubbing, rash, edema Neuro: Alert & orientedx3, cranial nerves grossly intact. moves all 4 extremities w/o difficulty. Affect pleasant   Telemetry   Personally reviewed.  NSR, rare PVCs.   EKG    Personally reviewed.  NSR with nonspecific T wave changes.   Labs    CBC  Recent Labs  04/05/17 1157 04/06/17 0404  WBC 9.4  10.1  HGB 11.3* 10.1*  HCT 34.8* 31.1*  MCV 82.1 81.4  PLT 219 101   Basic Metabolic Panel  Recent Labs  04/05/17 2232 04/06/17 0404  NA 136 138  K 4.2 3.5  CL 101 104  CO2 20* 23  GLUCOSE 424* 167*  BUN 27* 30*  CREATININE 1.55* 1.69*  CALCIUM 8.3* 8.2*  MG 1.4*  --    Liver Function Tests  Recent Labs  04/05/17 2235  AST 25  ALT 19  ALKPHOS 130*  BILITOT 1.5*  PROT 7.4  ALBUMIN 3.2*   No results for input(s): LIPASE, AMYLASE in the last 72 hours. Cardiac Enzymes  Recent Labs  04/05/17 2234 04/06/17 0404  TROPONINI <0.03 0.03*    BNP: BNP (last 3 results)  Recent Labs  01/17/17 1204 02/08/17 1250 04/05/17 1510  BNP 353.6* 1,330.6* 2,214.8*    ProBNP (last 3 results) No results for input(s): PROBNP in the last 8760 hours.   D-Dimer No results for input(s): DDIMER in the last 72 hours. Hemoglobin A1C No results for input(s): HGBA1C in the last 72 hours. Fasting Lipid Panel No results for input(s): CHOL, HDL, LDLCALC, TRIG, CHOLHDL, LDLDIRECT in the last 72 hours. Thyroid Function Tests No results for input(s): TSH, T4TOTAL, T3FREE,  THYROIDAB in the last 72 hours.  Invalid input(s): FREET3  Other results:   Imaging    Dg Chest 2 View  Result Date: 04/05/2017 CLINICAL DATA:  Four days of sharp sub sternal non radiating chest discomfort. Two days of nausea and vomiting with inability to take medications. History of diabetes, hypertension, ischemic cardiomyopathy with dysrhythmia is, previous MI, nonsmoker. EXAM: CHEST  2 VIEW COMPARISON:  PA and lateral chest x-ray of February 11, 2017 FINDINGS: The lungs are mildly hypoinflated. There is a new small right pleural effusion as well as trace left pleural effusion. The cardiac silhouette remains enlarged. The pulmonary vascularity is more engorged and there is cephalization of the vascular pattern. The ICD is in stable position. The bony thorax is unremarkable. The gas pattern in the upper abdomen is  nonspecific. IMPRESSION: CHF with pulmonary vascular congestion and new small right and trace left pleural effusions. The appearance of the chest has deteriorated since the previous study. Electronically Signed   By: David  Martinique M.D.   On: 04/05/2017 12:15   Dg Abd 2 Views  Result Date: 04/05/2017 CLINICAL DATA:  Nausea and vomiting. EXAM: ABDOMEN - 2 VIEW COMPARISON:  11/17/2011 abdominal radiograph. 02/11/2008 CT abdomen and pelvis. FINDINGS: There is no evidence of intraperitoneal free air. Scattered colonic gas is present (including in sigmoid colon which courses into the right lower quadrant). No dilated loops of bowel are seen to suggest obstruction. An ICD is partially visualized. Lung findings, including a small right pleural effusion, are more fully evaluated on today's earlier chest radiographs. IMPRESSION: Nonobstructed bowel gas pattern. Electronically Signed   By: Logan Bores M.D.   On: 04/05/2017 17:04      Medications:     Scheduled Medications: . atorvastatin  80 mg Oral q1800  . carvedilol  3.125 mg Oral BID WC  . clopidogrel  75 mg Oral Daily  . digoxin  0.125 mg Oral Daily  . furosemide  80 mg Intravenous Q8H  . gabapentin  300 mg Oral QHS  . hydrALAZINE  25 mg Oral TID  . isosorbide mononitrate  60 mg Oral Daily  . LORazepam  0.5 mg Intravenous Once  . ranolazine  500 mg Oral BID  . sodium chloride flush  3 mL Intravenous Q12H  . sodium chloride flush  3 mL Intravenous Q12H     Infusions: . sodium chloride    . sodium chloride Stopped (04/06/17 0537)  . insulin (NOVOLIN-R) infusion Stopped (04/06/17 0515)  . magnesium sulfate 1 - 4 g bolus IVPB    . nitroGLYCERIN 30 mcg/min (04/06/17 0424)     PRN Medications:  sodium chloride, acetaminophen **OR** acetaminophen, metoCLOPramide, morphine injection, nitroGLYCERIN, ondansetron (ZOFRAN) IV, promethazine, promethazine, sodium chloride flush    Patient Profile   51 yo with history of CAD s/p most recent  PCI in 6/54, chronic systolic CHF/ischemic cardiomyopathy, type II diabetes with gastroparesis, recurrent DVTs on warfarin was admitted with acute on chronic systolic CHF, DKA, and abdominal discomfort/nausea.   Assessment/Plan   1. Acute on chronic systolic CHF: Ischemic cardiomyopathy. Most recent echo in 3/18 with EF 25-30%, normal RV.  He is volume overloaded on exam.  CXR with pulmonary edema.  He looks uncomfortable, prominent nausea.  CHF may be playing a role in nausea with gut edema. Given his past history, I am concerned for low output heart failure.  He does not appear to have had much diuresis so far but looks like only 1 dose of IV Lasix.  -  Increase Lasix to 80 mg IV every 8 hrs.  Right now, would not be able to hold down metolazone tab.  - Place PICC line to follow co-ox/CVP.  If output low, will need milrinone (has needed in the past).   - Continue NTG gtt for afterload reduction with elevated diastolic BP.  - He is written for hydralazine/nitrates, low dose Coreg, and digoxin but right now cannot take pills. Digoxin level not elevated at admission.  - Has not been on spironolactone due to hyperkalemia or ACEI/ARB/ARNI with elevated creatinine.  - Obtain repeat ECG.  2. CAD: s/p PCI in 3/18 with DES to RCA and mid LAD. No chest pain at this time.  Troponin not significantly elevated.  - Continue Plavix when he can take po.  Not on ASA due to warfarin use.  - Continue statin when he can take po.  3. H/o DVTs (recurrent): None recently. Restart warfarin when he can take po.  4. Diabetes: Presented with DKA, anion gap has now closed.  Per CCM.  5. Abdominal pain/nausea: Very prominent. Zofran not helping.  He has history of diabetic gastroparesis as well as Crohns disease.  LFTs with mild bilirubin elevation, may be due to CHF.  Suspect volume overload/gut edema is playing a role as well as gastroparesis.  - Try phenergan this morning.  - Diuresis.  - Abdominal US to rule out  gallstones/cholecystitis.   6. CKD: Stage 3.  Creatinine up mildly from admission but not far from baseline.  Possible cardiorenal, as above placing PICC to check output.   Length of Stay: 1   CRITICAL CARE Performed by: Loralie Champagne  Total critical care time: 35 minutes  Critical care time was exclusive of separately billable procedures and treating other patients.  Critical care was necessary to treat or prevent imminent or life-threatening deterioration.  Critical care was time spent personally by me on the following activities: development of treatment plan with patient and/or surrogate as well as nursing, discussions with consultants, evaluation of patient's response to treatment, examination of patient, obtaining history from patient or surrogate, ordering and performing treatments and interventions, ordering and review of laboratory studies, ordering and review of radiographic studies, pulse oximetry and re-evaluation of patient's condition.  Loralie Champagne, MD  04/06/2017, 7:35 AM  Advanced Heart Failure Team Pager [phone] (M-F; 7a - 4p)  Please contact [facility] for night-coverage after hours (4p -7a ) and weekends on [email or web address]

## 2017-04-06 NOTE — Progress Notes (Signed)
Inpatient Diabetes Program Recommendations  AACE/ADA: New Consensus Statement on Inpatient Glycemic Control (2015)  Target Ranges:  Prepandial:   less than 140 mg/dL      Peak postprandial:   less than 180 mg/dL (1-2 hours)      Critically ill patients:  140 - 180 mg/dL   Results for CARDELL, [name] (MRN [id number]) as of 04/06/2017 11:14  Ref. Range 04/06/2017 00:48 04/06/2017 01:26 04/06/2017 02:36 04/06/2017 04:04 04/06/2017 05:18 04/06/2017 06:13 04/06/2017 07:17 04/06/2017 09:28  Glucose-Capillary Latest Ref Range: 65 - 99 mg/dL 326 (H) 306 (H) 245 (H) 153 (H) 97 108 (H) 130 (H) 152 (H)   Review of Glycemic Control  Diabetes history: DM2 Outpatient Diabetes medications: Lantus 37 units daily Current orders for Inpatient glycemic control: Novolog 0-9 units TID  Inpatient Diabetes Program Recommendations: Insulin - Basal: Once CBGs are consistently greater than 180 mg/dl with Novolog correction, please consider ordering low dose basal insulin. Insulin-Correction: Please consider changing frequency of CBGs and Novolog to Q4H if patient continues to be nauseous and not want to eat.  NOTE: Noted patient was started on an insulin drip on 04/05/17 and it was stopped around 5:15 am today. No basal insulin was given at time of transition off IV insulin and CBGs have been fairly stable.  Thanks, Barnie Alderman, RN, MSN, CDE Diabetes Coordinator Inpatient Diabetes Program [phone] (Team Pager from 8am to 5pm)

## 2017-04-06 NOTE — ED Notes (Addendum)
NS infusion changed to D5-1/2NS per protocol per order and verbal confirmation from hospitalist. D5-1/2NS infusing at 7m/hr.

## 2017-04-06 NOTE — Care Management Note (Addendum)
Case Management Note  Patient Details  Name: GIBSON LAD MRN: [id number] Date of Birth: 01-May-1966  Subjective/Objective:  From home with his adult children,pta indep, presents with DKA, CHF,  n/v, conts on iv lasix, ntg drip.  He has PCP and medication coverage.  8/6 [place], BSN - NCM offered choice for [facility] for disease Management and HHPT and rolling walker.  Patient states he would like to have rolling walker at dc and would like to go through [facility] for that.  He chose [facility] [place] [facility], referral made to [facility] with [facility].  Patient ambulated 400 feet with cardiac rehab, but he still would like HHPT.  NCM checking on copay for services and DME.    Patient has decided he wants cardiac rehab instead of [facility] services but would like to still get the rolling walker. NCM notified MD of this information.                  Action/Plan: NCM will follow for dc needs.   Expected Discharge Date:                  Expected Discharge Plan:     In-House Referral:     Discharge planning Services  CM Consult  Post Acute Care Choice:    Choice offered to:     DME Arranged:    DME Agency:     HH Arranged:    HH Agency:     Status of Service:  In process, will continue to follow  If discussed at Long Length of Stay Meetings, dates discussed:    Additional Comments:  Zenon Mayo, RN 04/06/2017, 2:32 PM

## 2017-04-06 NOTE — ED Notes (Signed)
IV team at bedside for PICC placement.

## 2017-04-06 NOTE — Progress Notes (Signed)
FPTS Interim Progress Note  S:Patient seen and examined at bedside. He continues to be nauseous with persistent dry heaving the entire time I am in the room. States his breathing is comfortable.  O: BP (!) 133/99   Pulse 92   Temp 98.8 F (37.1 C) (Oral)   Resp 18   Ht 5' 8"  (1.727 m)   Wt 185 lb (83.9 kg)   SpO2 97%   BMI 28.13 kg/m   GEN: chronically-ill appearing male rests in bed with persistent dry-heaving, uncomfortable but stable-appearing ABD: soft  PULM: normal work of breathing, no wheezes CARD: RRR EXT: 0-1+ LE edema bil  A/P: T2DM - Yesterday evening pt noted to be in DKA with AG 18, ABG UA notable for >500 glucose, 5 ketones, >300 protein - overnight placed on a modified DKA protocol with insulin gtt and decreased fluids given CHF exacerbation - CBG 97 with gap closed x2. DC insulin gtt.  - placed diet order though patient states he cannot tolerate PO even with pre-medication with zofran. He doubts he can keep down sips of juice. - hold Lantus at this time given inability to tolerate PO - monitor CBG q1h - as CBG rises, plan to monitor and treat with Lantus  Discussed plan w/attending. Rest of the plan per daily progress note.  Everrett Coombe, MD 04/06/2017, 6:26 AM PGY-2, [place] Medicine Service pager [phone]

## 2017-04-06 NOTE — Progress Notes (Signed)
Peripherally Inserted Central Catheter/Midline Placement  The IV Nurse has discussed with the patient and/or persons authorized to consent for the patient, the purpose of this procedure and the potential benefits and risks involved with this procedure.  The benefits include less needle sticks, lab draws from the catheter, and the patient may be discharged home with the catheter. Risks include, but not limited to, infection, bleeding, blood clot (thrombus formation), and puncture of an artery; nerve damage and irregular heartbeat and possibility to perform a PICC exchange if needed/ordered by physician.  Alternatives to this procedure were also discussed.  Bard Power PICC patient education guide, fact sheet on infection prevention and patient information card has been provided to patient /or left at bedside.    PICC/Midline Placement Documentation  PICC Double Lumen 04/06/17 PICC Right Brachial 42 cm (Active)  Indication for Insertion or Continuance of Line Vasoactive infusions 04/06/2017  9:00 AM  Dressing Change Due 04/13/17 04/06/2017  9:00 AM       [name] 04/06/2017, 9:05 AM

## 2017-04-06 NOTE — Progress Notes (Signed)
  Echocardiogram 2D Echocardiogram has been performed.  [name] 04/06/2017, 3:08 PM

## 2017-04-06 NOTE — ED Notes (Signed)
cbg was 130

## 2017-04-06 NOTE — Progress Notes (Signed)
Family Medicine Teaching Service Daily Progress Note Intern Pager: [phone]  Patient name: [name] Medical record number: [id number] Date of birth: Jan 19, 1966 Age: 51 y.o. Gender: male  Primary Care Provider: [name], MD Consultants: Cardio Code Status: Full  Pt Overview and Major Events to Date:  [name] is a 51 y.o. male presenting with chest pain, nausea/vomiting x 3 days. PMH is significant for CHF (EF25-30%), NSTEMI, CKD stage 3, Chrohns, DVTs, colectomy/appendectomy/hernia repair, neoplasm of colon, hypercholesterolemia.   Significant events include PIC line 8/2 due to increasing AKI.   Assessment and Plan: [name] is a 51 y.o. male presenting with chest pain, nausea/vomiting x 3 days. PMH is significant for CHF (EF25-30%), NSTEMI, CKD stage 3, Chrohns, DVTs, colectomy/appendectomy/hernia repair, neoplasm of colon, hypercholesterolemia  Chest Pain/CHF Exacerbation,: Vitals showing hypertension to 160/103, otherwise vital signs are stable and patient is 100% oxygen saturation on room air. Physical exam showing trace edema in extremities and mild crackles at the lung bases otherwise surprisingly stable pulmonary exam. Labs significant for a bicarbonate of 18, creatinine of 1.42, BUN/creatinine 23, BNP 2214.8. Digoxin level is low at <0.2.Troponin negative 1. EKG not showing any acute ST changes. Chest x-ray showing signs of pulmonary vascular congestion with small right pleural effusion and trace left pleural effusion. 40 mg IV Lasix given in ED. Of note patient recently admitted in June 2018 for similar symptoms and was found to have a CHF exacerbation. He was admitted to the advanced heart failure at that time. Last echocardiogram done in March showing EF 25-30%.Patient likely having CHF exacerbation again versus ACS event. Apparently per patient nausea and vomiting is very common for him especially if he has any pulmonary fluid overload.  States his dry weight 180-185. HEART Score of 5.  -Admit to family practice teaching service, telemetry, attending Dr. Nori Riis -Vital signs per floor protocol -Continuous cardiac monitoring -Pulse oximetry with vitals -Strict I's and O's -Daily weights -Cardiology consulted, appreciate their assistance -Trend troponins (.03, .03) - Daily EKG - Continue home Reglan and start Phenergan when necessary -Abd xr abd 2view,chest- pulmonary vascular congestion, no GI obstruction -clear liquid diet for now, can advance as tolerated -Continue home cardiac medications as below except for torsemide -IV Lasix 80 mg twice a day  -hytdralazine 17m TID -IMDUR 688mdaily -nitro drip -renazoline 50069mD -PICC line being installed and IV milrinone per cardio -I/O neutral @~550m58meight, cardio note said +5-6 from baseline, daily weights ordered    Nausea, Vomiting:Possible due to gastroperesis/viral gastro enteritis/DKACHF related edema --Abd xr abd 2view,-  no GI obstruction   T2DM- takes 37 units of Lantus daily. Last hemoglobin A1c in 02/09/2017; 7.5, glucosuria >500 -Q1hr glucose -will address as labs come in with lantus   CKD stage 3: Baseline creatinine appears to be around 1.32- 2.2 (difficult to determine as he has had variable creatinine this year) however last recorded creatinine prior to this admission was 1.46 in June 2018.  Cr. 1.55<1.69 -Daily BMP -Attempt to avoid nephrotoxic agents, we will need to give him Lasix though -PICC line and IV milrinone if urine output drops or renal function worses (per cardio)  Hypertension: Hypertensive on admission to 160/103 -Continue home Coreg, hydralazine, Imdur, digoxin per cardio -Holding home torsemide and starting IV Lasix as above -Appreciate cardiology's recommendations  Significant GI history: History of Crohn's disease, colectomy, diverticulosis, rectal polyp, and gastroparesis (secondary to diabetes). Hx of chronic nausea and  vomiting.  -Continue home Reglan -Phenergan prn -Abdominal  imaging as above  History of recurrent DVTs: On Coumadin at home -Coumadin per pharmacy   Hyperlipidemia:  - Continue home atorvastatin  FEN/GI: clear liquids Prophylaxis: coumadin per pharmacy, PT/INR ordered  Disposition: home pending Cardiology clearance, could be 2-3 days given severe presentation  Subjective:  Patient was getting a PIC line during prerounds, per nursing was feeling slightly improved with phenergen/ativan/morphine.  Objective: Temp:  [98.8 F (37.1 C)] 98.8 F (37.1 C) (08/01 1133) Pulse Rate:  [77-96] 92 (08/02 0614) Resp:  [11-28] 18 (08/02 0614) BP: (104-168)/(71-107) 133/99 (08/02 0614) SpO2:  [93 %-100 %] 97 % (08/02 0614) Weight:  [185 lb (83.9 kg)] 185 lb (83.9 kg) (08/01 2203) Physical Exam: Unable to examine during prerounds due to PCI line  Laboratory:  Recent Labs Lab 04/05/17 1157 04/06/17 0404  WBC 9.4 10.1  HGB 11.3* 10.1*  HCT 34.8* 31.1*  PLT 219 248    Recent Labs Lab 04/05/17 1510 04/05/17 2232 04/05/17 2235 04/06/17 0404  NA 137 136  --  138  K 4.2 4.2  --  3.5  CL 101 101  --  104  CO2 18* 20*  --  23  BUN 23* 27*  --  30*  CREATININE 1.42* 1.55*  --  1.69*  CALCIUM 8.4* 8.3*  --  8.2*  PROT  --   --  7.4  --   BILITOT  --   --  1.5*  --   ALKPHOS  --   --  130*  --   ALT  --   --  19  --   AST  --   --  25  --   GLUCOSE 312* 424*  --  167*    troponins .03,.03 BNP 2214   Imaging/Diagnostic Tests: Dg Chest 2 View  Result Date: 04/05/2017 CLINICAL DATA:  Four days of sharp sub sternal non radiating chest discomfort. Two days of nausea and vomiting with inability to take medications. History of diabetes, hypertension, ischemic cardiomyopathy with dysrhythmia is, previous MI, nonsmoker. EXAM: CHEST  2 VIEW COMPARISON:  PA and lateral chest x-ray of February 11, 2017 FINDINGS: The lungs are mildly hypoinflated. There is a new small right pleural effusion  as well as trace left pleural effusion. The cardiac silhouette remains enlarged. The pulmonary vascularity is more engorged and there is cephalization of the vascular pattern. The ICD is in stable position. The bony thorax is unremarkable. The gas pattern in the upper abdomen is nonspecific. IMPRESSION: CHF with pulmonary vascular congestion and new small right and trace left pleural effusions. The appearance of the chest has deteriorated since the previous study. Electronically Signed   By: David  Martinique M.D.   On: 04/05/2017 12:15   Dg Abd 2 Views  Result Date: 04/05/2017 CLINICAL DATA:  Nausea and vomiting. EXAM: ABDOMEN - 2 VIEW COMPARISON:  11/17/2011 abdominal radiograph. 02/11/2008 CT abdomen and pelvis. FINDINGS: There is no evidence of intraperitoneal free air. Scattered colonic gas is present (including in sigmoid colon which courses into the right lower quadrant). No dilated loops of bowel are seen to suggest obstruction. An ICD is partially visualized. Lung findings, including a small right pleural effusion, are more fully evaluated on today's earlier chest radiographs. IMPRESSION: Nonobstructed bowel gas pattern. Electronically Signed   By: Logan Bores M.D.   On: 04/05/2017 17:04     Sherene Sires, DO 04/06/2017, 7:07 AM PGY-1, [place] Intern pager: [phone], text pages welcome

## 2017-04-06 NOTE — Progress Notes (Signed)
Notified Dr Wallace Going patient now unable to take Ativan. Patient now  skeepy and confused when first awake however reoriented easily.

## 2017-04-07 ENCOUNTER — Encounter: Payer: Self-pay | Admitting: Cardiology

## 2017-04-07 ENCOUNTER — Encounter (HOSPITAL_COMMUNITY): Payer: BLUE CROSS/BLUE SHIELD

## 2017-04-07 DIAGNOSIS — I5043 Acute on chronic combined systolic (congestive) and diastolic (congestive) heart failure: Secondary | ICD-10-CM

## 2017-04-07 DIAGNOSIS — R1114 Bilious vomiting: Secondary | ICD-10-CM

## 2017-04-07 DIAGNOSIS — R110 Nausea: Secondary | ICD-10-CM

## 2017-04-07 LAB — BASIC METABOLIC PANEL
Anion gap: 10 (ref 5–15)
BUN: 33 mg/dL — AB (ref 6–20)
CHLORIDE: 100 mmol/L — AB (ref 101–111)
CO2: 29 mmol/L (ref 22–32)
CREATININE: 1.75 mg/dL — AB (ref 0.61–1.24)
Calcium: 8 mg/dL — ABNORMAL LOW (ref 8.9–10.3)
GFR calc Af Amer: 50 mL/min — ABNORMAL LOW (ref >60)
GFR, EST NON AFRICAN AMERICAN: 43 mL/min — AB (ref >60)
GLUCOSE: 178 mg/dL — AB (ref 65–99)
POTASSIUM: 3.2 mmol/L — AB (ref 3.5–5.1)
SODIUM: 139 mmol/L (ref 135–145)

## 2017-04-07 LAB — GLUCOSE, CAPILLARY
Glucose-Capillary: 158 mg/dL — ABNORMAL HIGH (ref 65–99)
Glucose-Capillary: 130 mg/dL — ABNORMAL HIGH (ref 65–99)
Glucose-Capillary: 194 mg/dL — ABNORMAL HIGH (ref 65–99)
Glucose-Capillary: 205 mg/dL — ABNORMAL HIGH (ref 65–99)
Glucose-Capillary: 242 mg/dL — ABNORMAL HIGH (ref 65–99)

## 2017-04-07 LAB — COOXEMETRY PANEL
Carboxyhemoglobin: 1.7 % — ABNORMAL HIGH (ref 0.5–1.5)
METHEMOGLOBIN: 1.1 % (ref 0.0–1.5)
O2 Saturation: 65.9 %
TOTAL HEMOGLOBIN: 10.3 g/dL — AB (ref 12.0–16.0)

## 2017-04-07 LAB — PROTIME-INR
INR: 1.21
Prothrombin Time: 15.4 seconds — ABNORMAL HIGH (ref 11.4–15.2)

## 2017-04-07 LAB — CBC
HEMATOCRIT: 31.6 % — AB (ref 39.0–52.0)
HEMOGLOBIN: 10.2 g/dL — AB (ref 13.0–17.0)
MCH: 26.3 pg (ref 26.0–34.0)
MCHC: 32.3 g/dL (ref 30.0–36.0)
MCV: 81.4 fL (ref 78.0–100.0)
Platelets: 264 10*3/uL (ref 150–400)
RBC: 3.88 MIL/uL — AB (ref 4.22–5.81)
RDW: 14.8 % (ref 11.5–15.5)
WBC: 12.9 10*3/uL — ABNORMAL HIGH (ref 4.0–10.5)

## 2017-04-07 LAB — LIPASE, BLOOD: LIPASE: 17 U/L (ref 11–51)

## 2017-04-07 MED ORDER — WARFARIN SODIUM 5 MG PO TABS
7.5000 mg | ORAL_TABLET | Freq: Once | ORAL | Status: AC
  Administered 2017-04-07: 7.5 mg via ORAL
  Filled 2017-04-07: qty 2

## 2017-04-07 MED ORDER — POTASSIUM CHLORIDE CRYS ER 20 MEQ PO TBCR: 40.0000 meq | EXTENDED_RELEASE_TABLET | Freq: Once | ORAL | Status: DC

## 2017-04-07 MED ORDER — INSULIN GLARGINE 100 UNIT/ML ~~LOC~~ SOLN
2.0000 [IU] | Freq: Every day | SUBCUTANEOUS | Status: DC
  Administered 2017-04-08: 2 [IU] via SUBCUTANEOUS
  Filled 2017-04-07: qty 0.02

## 2017-04-07 MED ORDER — WARFARIN - PHARMACIST DOSING INPATIENT
Freq: Every day | Status: DC
  Administered 2017-04-07: 18:00:00
  Administered 2017-04-10: 1

## 2017-04-07 MED ORDER — ISOSORB DINITRATE-HYDRALAZINE 20-37.5 MG PO TABS
1.0000 | ORAL_TABLET | Freq: Three times a day (TID) | ORAL | Status: DC
  Administered 2017-04-07 – 2017-04-13 (×18): 1 via ORAL
  Filled 2017-04-07 (×22): qty 1

## 2017-04-07 MED ORDER — POTASSIUM CHLORIDE CRYS ER 20 MEQ PO TBCR
40.0000 meq | EXTENDED_RELEASE_TABLET | Freq: Once | ORAL | Status: AC
  Administered 2017-04-07: 40 meq via ORAL
  Filled 2017-04-07: qty 2

## 2017-04-07 MED ORDER — FUROSEMIDE 10 MG/ML IJ SOLN
80.0000 mg | Freq: Two times a day (BID) | INTRAMUSCULAR | Status: DC
  Administered 2017-04-07 – 2017-04-08 (×2): 80 mg via INTRAVENOUS
  Filled 2017-04-07 (×3): qty 8

## 2017-04-07 NOTE — Progress Notes (Signed)
ANTICOAGULATION CONSULT NOTE - Follow Up Consult  Pharmacy Consult for Warfarin Indication: recurrent DVT  Allergies  Allergen Reactions  . Metformin And Related Diarrhea    Patient Measurements: Height: 5' 7"  (170.2 cm) Weight: 189 lb 6 oz (85.9 kg) IBW/kg (Calculated) : 66.1  Vital Signs: Temp: 97.5 F (36.4 C) (08/03 0841) Temp Source: Axillary (08/03 0841) BP: 138/78 (08/03 0841) Pulse Rate: 91 (08/03 0934)  Labs:  Recent Labs  04/05/17 1157  04/05/17 2234 04/06/17 0404 04/06/17 0948 04/07/17 0500 04/07/17 0628 04/07/17 0629  HGB 11.3*  --   --  10.1*  --   --   --  10.2*  HCT 34.8*  --   --  31.1*  --   --   --  31.6*  PLT 219  --   --  248  --   --   --  264  LABPROT  --   --   --  16.4*  --   --  15.4*  --   INR  --   --   --  1.31  --   --  1.21  --   CREATININE  --   < >  --  1.69* 1.63* 1.75*  --   --   TROPONINI  --   --  <0.03 0.03* <0.03  --   --   --   < > = values in this interval not displayed.  Estimated Creatinine Clearance: 51.7 mL/min (A) (by C-G formula based on SCr of 1.75 mg/dL (H)).   Medications:  Home Coumadin dose: 7.31m on Mondays and Saturdays, 588mall other days  Assessment: [age]ear old male admitted with CHF exacerbation. He is on chronic anticoagulation for recurrent DVTs with warfarin. His warfarin was held on admission and he is currently being anticoagulated with Lovenox. His warfarin will restart today.  His INR was subtherapeutic on admission.  Goal of Therapy:  INR 2-3 Monitor platelets by anticoagulation protocol: Yes   Plan:  Warfarin 7.83m83moday Daily PT/INR Continue Lovenox per MD  TurNorva Riffle3/2018,10:32 AM

## 2017-04-07 NOTE — Progress Notes (Signed)
CARDIAC REHAB PHASE I   PRE:  Rate/Rhythm: 88 SR  BP:  Sitting: 157/94         SaO2: 100 RA  MODE:  Ambulation: 120 ft   POST:  Rate/Rhythm: 96 SR c/ PVCs  BP:  Sitting: 135/102         SaO2: 98 RA  Pt in bed, very drowsy, RN states pt did receive ativan earlier, slow to respond, however answers questions appropriately. Pt agreeable to walk. Pt with loss of balance upon standing, agreeable to use rolling walker. Pt ambulated 120 ft on RA, IV, gait belt, assist x1, very slow, unsteady gait with frequent posterior lean, tolerated fair, verbal cues for use of RW, safety. Pt states he does not have a walker at home, states he has fallen in the past 3 months. Pt would benefit from PT consult to help maximize mobility. BP elevated upon return to room. Pt to recliner after walk, feet elevated, call bell within reach. Will follow as x2.   [id number] Lenna Sciara, RN, BSN 04/07/2017 11:06 AM

## 2017-04-07 NOTE — Progress Notes (Signed)
Family Medicine Teaching Service Daily Progress Note Intern Pager: [phone]  Patient name: [name] Medical record number: [id number] Date of birth: [birth year] Age: 51 y.o. Gender: male  Primary Care Provider: [name], MD Consultants: Cardio Code Status: Full  Pt Overview and Major Events to Date:  [name] a 51 y.o.malepresenting with chest pain, nausea/vomiting x 3 days. PMH is significant for CHF (EF25-30%), NSTEMI, CKD stage 3, Chrohns, DVTs, colectomy/appendectomy/hernia repair, neoplasm of colon, hypercholesterolemia.   Significant events include PIC line 8/2 due to increasing AKI.   Assessment and Plan: [name] a 51 y.o.malepresenting with chest pain, nausea/vomiting x 3 days. PMH is significant for CHF (EF25-30%), NSTEMI, CKD stage 3, Chrohns, DVTs, colectomy/appendectomy/hernia repair, neoplasm of colon, hypercholesterolemia  Chest Pain/CHF Exacerbation,:  Labs significant for a bicarbonate of 18, creatinine of 1.42, BUN/creatinine 23, BNP 2214.8. Digoxin level on admission was low at <0.2.Troponin negative 2. Chest x-ray showing signs of pulmonary vascular congestion with small right pleural effusion and trace left pleural effusion. Patient likely having CHF exacerbation again versus ACS event. Apparently per patient nausea and vomiting is very common for him especially if he has any pulmonary fluid overload. States his dryweight 180-185.HEART Score of 5.  -64M followed by cardio -EF now 20-25% (8/2) -Vital signs per floor protocol, Continuous cardiac monitoring, Pulse oximetry  -CVP via PIC line per cardio -Strict I's and O's, Daily weights - Daily EKG - Continue home Reglan  -Abd xr abd 2view,chest- pulmonary vascular congestion, no GI obstruction -IV Lasix 80 mg BID  -IMDUR 74m daily -nitro drip -renazoline 5064mID   Nausea, Vomiting:Possible due to gastroperesis/viral gastro enteritis/DKACHF related  edema --Abd xr abd 2view,-  no GI obstruction -phenrgan or zophran as tolerated -RUQ ultrasound neg for cholescystitis  T2DM- takes 37 units of Lantus daily. Last hemoglobin A1c in 02/09/2017; 7.5, glucosuria >500 -Q1hr glucose -will address as labs come in with lantus  -cardio suggests considering empagiflozin  CKD stage 3: Baseline creatinine appears to be around 1.32- 2.2 (difficult to determine as he has had variable creatinine this year) however last recorded creatinine prior to this admission was 1.46 in June 2018.  Cr. 1.55<1.69<1.75 -Daily BMP -Attempt to avoid nephrotoxic agents, we will need to give him Lasix though  Hypertension:Hypertensive on admission to 160/103 -Continue home Coreg, Imdur, digoxin per cardio -Holding home torsemide and starting IV Lasix as above -Appreciate cardiology's recommendations  Significant GIhistory: History of Crohn's disease, colectomy, diverticulosis, rectal polyp, and gastroparesis (secondary to diabetes). Hx of chronic nausea and vomiting.  -Continue home Reglan -Phenergan prn -Abdominal imaging as above  History of recurrent DVTs: On Coumadin at home -Coumadin per pharmacy   Hyperlipidemia:  - Continue home atorvastatin  Hypokalemia- 3.2 8/2 - received 8052mCl  Hypocalcemia- 8.0  8/2 - will order mag/phos to evaluate  FEN/GI: renal/carb diet Prophylaxis: coumadin per pharmacy, PT/INR ordered  Disposition:home pending Cardiology clearance, could be 2-3 days given severe presentation  Subjective:  Sleeping during rounds but per nursing had went overnight without vomiting and was toleration PO snacks/light food  Objective: Temp:  [98 F (36.7 C)-98.9 F (37.2 C)] 98.9 F (37.2 C) (08/03 0300) Pulse Rate:  [56-101] 82 (08/03 0500) Resp:  [12-25] 17 (08/03 0500) BP: (95-165)/(55-108) 125/79 (08/03 0500) SpO2:  [90 %-100 %] 95 % (08/03 0500) Weight:  [189 lb 6 oz (85.9 kg)-191 lb 2.2 oz (86.7 kg)] 191 lb 2.2  oz (86.7 kg) (08/03 0332) Physical Exam: General: sleeping comfortable, no  pallor Cardiovascular: RRR, systolic murmur, Respiratory: normal work of breathing, no obvious wheezes/rhonchi, faint crackles at bases Gastrointestinal: soft belly to exam,mulitple well healed surgical scars,bowel sounds in all 4 quadrants MSK: 1+ pitting edema on lower extremities bilaterally,   Laboratory:  Recent Labs Lab 04/05/17 1157 04/06/17 0404  WBC 9.4 10.1  HGB 11.3* 10.1*  HCT 34.8* 31.1*  PLT 219 248    Recent Labs Lab 04/05/17 2235 04/06/17 0404 04/06/17 0948 04/07/17 0500  NA  --  138 139 139  K  --  3.5 3.6 3.2*  CL  --  104 100* 100*  CO2  --  23 24 29   BUN  --  30* 31* 33*  CREATININE  --  1.69* 1.63* 1.75*  CALCIUM  --  8.2* 8.5* 8.0*  PROT 7.4  --   --   --   BILITOT 1.5*  --   --   --   ALKPHOS 130*  --   --   --   ALT 19  --   --   --   AST 25  --   --   --   GLUCOSE  --  167* 159* 178*      Imaging/Diagnostic Tests: Dg Chest 2 View  Result Date: 04/05/2017 CLINICAL DATA:  Four days of sharp sub sternal non radiating chest discomfort. Two days of nausea and vomiting with inability to take medications. History of diabetes, hypertension, ischemic cardiomyopathy with dysrhythmia is, previous MI, nonsmoker. EXAM: CHEST  2 VIEW COMPARISON:  PA and lateral chest x-ray of February 11, 2017 FINDINGS: The lungs are mildly hypoinflated. There is a new small right pleural effusion as well as trace left pleural effusion. The cardiac silhouette remains enlarged. The pulmonary vascularity is more engorged and there is cephalization of the vascular pattern. The ICD is in stable position. The bony thorax is unremarkable. The gas pattern in the upper abdomen is nonspecific. IMPRESSION: CHF with pulmonary vascular congestion and new small right and trace left pleural effusions. The appearance of the chest has deteriorated since the previous study. Electronically Signed   By: David  Martinique M.D.   On:  04/05/2017 12:15   Dg Abd 2 Views  Result Date: 04/05/2017 CLINICAL DATA:  Nausea and vomiting. EXAM: ABDOMEN - 2 VIEW COMPARISON:  11/17/2011 abdominal radiograph. 02/11/2008 CT abdomen and pelvis. FINDINGS: There is no evidence of intraperitoneal free air. Scattered colonic gas is present (including in sigmoid colon which courses into the right lower quadrant). No dilated loops of bowel are seen to suggest obstruction. An ICD is partially visualized. Lung findings, including a small right pleural effusion, are more fully evaluated on today's earlier chest radiographs. IMPRESSION: Nonobstructed bowel gas pattern. Electronically Signed   By: Logan Bores M.D.   On: 04/05/2017 17:04   US Abdomen Limited Ruq  Result Date: 04/06/2017 CLINICAL DATA:  Nausea and vomiting for 3 days. History of CHF, Crohn's disease, hypertension, diabetes. Previous colectomy, appendectomy, hernia repair. EXAM: ULTRASOUND ABDOMEN LIMITED RIGHT UPPER QUADRANT COMPARISON:  CT abdomen and pelvis 02/11/2008. Abdominal radiographs 04/05/2017 FINDINGS: Gallbladder: No gallstones or wall thickening visualized. No sonographic Murphy sign noted by sonographer. Common bile duct: Diameter: 4.1 mm, normal Liver: Heterogeneous increased parenchymal echotexture suggesting fatty infiltration of the liver. No focal lesions identified. Limited visualization of the liver due to rib shadowing. IMPRESSION: No evidence of cholelithiasis or cholecystitis. Probable fatty infiltration of the liver. Electronically Signed   By: Lucienne Capers M.D.   On: 04/06/2017 23:05  Sherene Sires, DO 04/07/2017, 6:31 AM PGY-1, [place] Intern pager: [phone], text pages welcome

## 2017-04-07 NOTE — Progress Notes (Signed)
Patient ID: [name], male   DOB: 1965-10-24, 51 y.o.   MRN: [id number]     Advanced Heart Failure Rounding Note  Primary Cardiologist: Aundra Dubin  Subjective:    Patient diuresed well, -2.9 L net.  Weight not changed but suspect he was weighed in bed so inaccurate.   He seems much more comfortable today.  No nausea/vomiting now, no abdominal pain, breathing improved.   Creatinine 1.63 =>1.75. Co-ox 66%, CVP 9-10.   Abdominal US: No gallstones/cholecystitis, fatty liver.   Echo: EF 20-25%, diffuse hypokinesis, mild MR.   Objective:   Weight Range: 191 lb 2.2 oz (86.7 kg) Body mass index is 29.94 kg/m.   Vital Signs:   Temp:  [98 F (36.7 C)-98.9 F (37.2 C)] 98.9 F (37.2 C) (08/03 0300) Pulse Rate:  [56-101] 85 (08/03 0700) Resp:  [12-25] 20 (08/03 0700) BP: (95-165)/(55-108) 131/77 (08/03 0700) SpO2:  [90 %-100 %] 98 % (08/03 0700) Weight:  [189 lb 6 oz (85.9 kg)-191 lb 2.2 oz (86.7 kg)] 191 lb 2.2 oz (86.7 kg) (08/03 0332) Last BM Date: 04/06/17  Weight change: Filed Weights   04/05/17 2203 04/06/17 1034 04/07/17 0332  Weight: 185 lb (83.9 kg) 189 lb 6 oz (85.9 kg) 191 lb 2.2 oz (86.7 kg)    Intake/Output:   Intake/Output Summary (Last 24 hours) at 04/07/17 0750 Last data filed at 04/07/17 0700  Gross per 24 hour  Intake           606.18 ml  Output             3550 ml  Net         -2943.82 ml      Physical Exam    General: NAD Neck: JVP 10 cm, no thyromegaly or thyroid nodule.  Lungs: Clear to auscultation bilaterally with normal respiratory effort. CV: Nondisplaced PMI.  Heart regular S1/S2, no S3/S4, no murmur.  No peripheral edema.   Abdomen: Soft, nontender, no hepatosplenomegaly, no distention.  Skin: Intact without lesions or rashes.  Neurologic: Alert and oriented x 3.  Psych: Normal affect. Extremities: No clubbing or cyanosis.  HEENT: Normal.    Telemetry   Personally reviewed.  NSR, rare PVCs.   EKG    Personally reviewed.   NSR with nonspecific T wave changes.   Labs    CBC  Recent Labs  04/06/17 0404 04/07/17 0629  WBC 10.1 12.9*  HGB 10.1* 10.2*  HCT 31.1* 31.6*  MCV 81.4 81.4  PLT 248 774   Basic Metabolic Panel  Recent Labs  04/05/17 2232  04/06/17 0948 04/07/17 0500  NA 136  < > 139 139  K 4.2  < > 3.6 3.2*  CL 101  < > 100* 100*  CO2 20*  < > 24 29  GLUCOSE 424*  < > 159* 178*  BUN 27*  < > 31* 33*  CREATININE 1.55*  < > 1.63* 1.75*  CALCIUM 8.3*  < > 8.5* 8.0*  MG 1.4*  --   --   --   < > = values in this interval not displayed. Liver Function Tests  Recent Labs  04/05/17 2235  AST 25  ALT 19  ALKPHOS 130*  BILITOT 1.5*  PROT 7.4  ALBUMIN 3.2*    Recent Labs  04/07/17 0500  LIPASE 17   Cardiac Enzymes  Recent Labs  04/05/17 2234 04/06/17 0404 04/06/17 0948  TROPONINI <0.03 0.03* <0.03    BNP: BNP (last 3 results)  Recent Labs  01/17/17 1204 02/08/17 1250 04/05/17 1510  BNP 353.6* 1,330.6* 2,214.8*    ProBNP (last 3 results) No results for input(s): PROBNP in the last 8760 hours.   D-Dimer No results for input(s): DDIMER in the last 72 hours. Hemoglobin A1C No results for input(s): HGBA1C in the last 72 hours. Fasting Lipid Panel No results for input(s): CHOL, HDL, LDLCALC, TRIG, CHOLHDL, LDLDIRECT in the last 72 hours. Thyroid Function Tests No results for input(s): TSH, T4TOTAL, T3FREE, THYROIDAB in the last 72 hours.  Invalid input(s): FREET3  Other results:   Imaging    US Abdomen Limited Ruq  Result Date: 04/06/2017 CLINICAL DATA:  Nausea and vomiting for 3 days. History of CHF, Crohn's disease, hypertension, diabetes. Previous colectomy, appendectomy, hernia repair. EXAM: ULTRASOUND ABDOMEN LIMITED RIGHT UPPER QUADRANT COMPARISON:  CT abdomen and pelvis 02/11/2008. Abdominal radiographs 04/05/2017 FINDINGS: Gallbladder: No gallstones or wall thickening visualized. No sonographic Murphy sign noted by sonographer. Common bile duct:  Diameter: 4.1 mm, normal Liver: Heterogeneous increased parenchymal echotexture suggesting fatty infiltration of the liver. No focal lesions identified. Limited visualization of the liver due to rib shadowing. IMPRESSION: No evidence of cholelithiasis or cholecystitis. Probable fatty infiltration of the liver. Electronically Signed   By: Lucienne Capers M.D.   On: 04/06/2017 23:05     Medications:     Scheduled Medications: . atorvastatin  80 mg Oral q1800  . carvedilol  3.125 mg Oral BID WC  . clopidogrel  75 mg Oral Daily  . digoxin  0.125 mg Oral Daily  . enoxaparin (LOVENOX) injection  1.5 mg/kg Subcutaneous Q24H  . furosemide  80 mg Intravenous BID  . gabapentin  300 mg Oral QHS  . insulin aspart  0-9 Units Subcutaneous TID WC  . isosorbide-hydrALAZINE  1 tablet Oral TID  . LORazepam  0.5 mg Intravenous TID  . metoCLOPramide (REGLAN) injection  5 mg Intravenous Q8H  . potassium chloride  40 mEq Oral Once  . potassium chloride  40 mEq Oral Once  . ranolazine  500 mg Oral BID  . 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   Assessment/Plan   1. Acute on chronic systolic CHF: Ischemic cardiomyopathy. St Jude ICD.  Echo this admission with EF 20-25% (fairly stable). He was admitted with volume overload and dyspnea as well as nausea/vomiting.  He diuresed well overnight, CVP down to 9-10.  Co-ox adequate this morning at 66%. Mild creatinine rise. On exam, still appears to have some volume overload.  - No need for milrinone at  this point.  - CVP down to 9-10 range, with rise in creatinine will drop back on Lasix to 80 mg IV bid but would like to see at least 1 more day of diuresis.    - Stop hydralazine and NTG gtt, start Bidil 1 tab tid.  - Continue current Coreg and digoxin.   - If creatinine stays stable, can rechallenge with spironolactone.  2. CAD: s/p PCI in 3/18 with DES to RCA and mid LAD. No chest pain at this time.  Troponin not significantly elevated.  - Continue Plavix.  Not on ASA  due to warfarin use.  - Continue statin.  3. H/o DVTs (recurrent): None recently. Restart warfarin, he can now take po.  4. Diabetes: Presented with DKA, anion gap has now closed.  Per primary service. Empagliflozin would be good choice in him for diabetes management if insurance will cover.  5. Abdominal pain/nausea: Much improved.  Abdominal US with no gallstones.  ?Due to gastroparesis + gut edema with CHF. 6. CKD: Stage 3.  Creatinine up mildly from admission but not far from baseline.  Possible cardiorenal.  Follow closely with diuresis.   Length of Stay: 2   Loralie Champagne, MD  04/07/2017, 7:50 AM  Advanced Heart Failure Team Pager [phone] (M-F; 7a - 4p)  Please contact [facility] for night-coverage after hours (4p -7a ) and weekends on [email or web address]

## 2017-04-07 NOTE — Plan of Care (Signed)
Problem: Education: Goal: Knowledge of [facility] Education information/materials will improve Outcome: Progressing Discussed with patient about plan of care and nitroglycerin drip with some teach back displayed.

## 2017-04-08 LAB — GLUCOSE, CAPILLARY
GLUCOSE-CAPILLARY: 223 mg/dL — AB (ref 65–99)
Glucose-Capillary: 177 mg/dL — ABNORMAL HIGH (ref 65–99)
Glucose-Capillary: 227 mg/dL — ABNORMAL HIGH (ref 65–99)
Glucose-Capillary: 192 mg/dL — ABNORMAL HIGH (ref 65–99)

## 2017-04-08 LAB — CBC
HCT: 31.8 % — ABNORMAL LOW (ref 39.0–52.0)
Hemoglobin: 10.3 g/dL — ABNORMAL LOW (ref 13.0–17.0)
MCH: 27 pg (ref 26.0–34.0)
MCHC: 32.4 g/dL (ref 30.0–36.0)
MCV: 83.2 fL (ref 78.0–100.0)
PLATELETS: 224 10*3/uL (ref 150–400)
RBC: 3.82 MIL/uL — AB (ref 4.22–5.81)
RDW: 15.4 % (ref 11.5–15.5)
WBC: 7 10*3/uL (ref 4.0–10.5)

## 2017-04-08 LAB — PROTIME-INR
INR: 1.23
Prothrombin Time: 15.6 seconds — ABNORMAL HIGH (ref 11.4–15.2)

## 2017-04-08 LAB — COOXEMETRY PANEL
Carboxyhemoglobin: 1.4 % (ref 0.5–1.5)
Methemoglobin: 1.2 % (ref 0.0–1.5)
O2 SAT: 68 %
Total hemoglobin: 10.2 g/dL — ABNORMAL LOW (ref 12.0–16.0)

## 2017-04-08 LAB — BASIC METABOLIC PANEL
Anion gap: 5 (ref 5–15)
BUN: 38 mg/dL — ABNORMAL HIGH (ref 6–20)
CHLORIDE: 103 mmol/L (ref 101–111)
CO2: 30 mmol/L (ref 22–32)
CREATININE: 2.1 mg/dL — AB (ref 0.61–1.24)
Calcium: 7.7 mg/dL — ABNORMAL LOW (ref 8.9–10.3)
GFR calc non Af Amer: 35 mL/min — ABNORMAL LOW (ref >60)
GFR, EST AFRICAN AMERICAN: 40 mL/min — AB (ref >60)
Glucose, Bld: 247 mg/dL — ABNORMAL HIGH (ref 65–99)
Potassium: 3.7 mmol/L (ref 3.5–5.1)
Sodium: 138 mmol/L (ref 135–145)

## 2017-04-08 LAB — MAGNESIUM: MAGNESIUM: 1.8 mg/dL (ref 1.7–2.4)

## 2017-04-08 LAB — PHOSPHORUS: Phosphorus: 4.1 mg/dL (ref 2.5–4.6)

## 2017-04-08 MED ORDER — WARFARIN SODIUM 5 MG PO TABS
7.5000 mg | ORAL_TABLET | Freq: Once | ORAL | Status: AC
  Administered 2017-04-08: 7.5 mg via ORAL
  Filled 2017-04-08: qty 2

## 2017-04-08 MED ORDER — PROMETHAZINE HCL 25 MG/ML IJ SOLN
12.5000 mg | Freq: Four times a day (QID) | INTRAMUSCULAR | Status: DC | PRN
  Administered 2017-04-10: 25 mg via INTRAVENOUS
  Filled 2017-04-08: qty 1

## 2017-04-08 NOTE — Progress Notes (Signed)
Patient ID: [name], male   DOB: 1965-12-07, 51 y.o.   MRN: [id number]     Advanced Heart Failure Rounding Note  Primary Cardiologist: Aundra Dubin  Subjective:    Overall weight down a few lbs.  He feels considerably better, not dyspneic.  CVP 8 when I measured this morning.  Co-ox remains adequate at 68%.  Creatinine up to 2.1. Nausea/vomiting have resolved.   Abdominal US: No gallstones/cholecystitis, fatty liver.   Echo: EF 20-25%, diffuse hypokinesis, mild MR.   Objective:   Weight Range: 184 lb 4.8 oz (83.6 kg) Body mass index is 28.87 kg/m.   Vital Signs:   Temp:  [97.3 F (36.3 C)-97.7 F (36.5 C)] 97.5 F (36.4 C) (08/04 0718) Pulse Rate:  [73-87] 83 (08/04 1100) Resp:  [0-25] 20 (08/04 1100) BP: (89-139)/(58-99) 111/77 (08/04 1100) SpO2:  [95 %-100 %] 98 % (08/04 1100) Weight:  [184 lb 4.8 oz (83.6 kg)] 184 lb 4.8 oz (83.6 kg) (08/04 0600) Last BM Date: 04/08/17  Weight change: Filed Weights   04/07/17 0756 04/07/17 0800 04/08/17 0600  Weight: 189 lb 6 oz (85.9 kg) 189 lb 6 oz (85.9 kg) 184 lb 4.8 oz (83.6 kg)    Intake/Output:   Intake/Output Summary (Last 24 hours) at 04/08/17 1211 Last data filed at 04/08/17 1010  Gross per 24 hour  Intake             1128 ml  Output             1175 ml  Net              -47 ml      Physical Exam    General: NAD Neck: JVP 8-9 cm, no thyromegaly or thyroid nodule.  Lungs: Clear to auscultation bilaterally with normal respiratory effort. CV: Nondisplaced PMI.  Heart regular S1/S2, no S3/S4, no murmur.  No peripheral edema.  No carotid bruit.  Normal pedal pulses.  Abdomen: Soft, nontender, no hepatosplenomegaly, no distention.  Skin: Intact without lesions or rashes.  Neurologic: Alert and oriented x 3.  Psych: Normal affect. Extremities: No clubbing or cyanosis.  HEENT: Normal.    Telemetry   Personally reviewed.  NSR, rare PVCs.   EKG    Personally reviewed.  NSR with nonspecific T wave changes.    Labs    CBC  Recent Labs  04/07/17 0629 04/08/17 0445  WBC 12.9* 7.0  HGB 10.2* 10.3*  HCT 31.6* 31.8*  MCV 81.4 83.2  PLT 264 664   Basic Metabolic Panel  Recent Labs  04/05/17 2232  04/07/17 0500 04/08/17 0445  NA 136  < > 139 138  K 4.2  < > 3.2* 3.7  CL 101  < > 100* 103  CO2 20*  < > 29 30  GLUCOSE 424*  < > 178* 247*  BUN 27*  < > 33* 38*  CREATININE 1.55*  < > 1.75* 2.10*  CALCIUM 8.3*  < > 8.0* 7.7*  MG 1.4*  --   --  1.8  PHOS  --   --   --  4.1  < > = values in this interval not displayed. Liver Function Tests  Recent Labs  04/05/17 2235  AST 25  ALT 19  ALKPHOS 130*  BILITOT 1.5*  PROT 7.4  ALBUMIN 3.2*    Recent Labs  04/07/17 0500  LIPASE 17   Cardiac Enzymes  Recent Labs  04/05/17 2234 04/06/17 0404 04/06/17 0948  TROPONINI <0.03 0.03* <0.03  BNP: BNP (last 3 results)  Recent Labs  01/17/17 1204 02/08/17 1250 04/05/17 1510  BNP 353.6* 1,330.6* 2,214.8*    ProBNP (last 3 results) No results for input(s): PROBNP in the last 8760 hours.   D-Dimer No results for input(s): DDIMER in the last 72 hours. Hemoglobin A1C No results for input(s): HGBA1C in the last 72 hours. Fasting Lipid Panel No results for input(s): CHOL, HDL, LDLCALC, TRIG, CHOLHDL, LDLDIRECT in the last 72 hours. Thyroid Function Tests No results for input(s): TSH, T4TOTAL, T3FREE, THYROIDAB in the last 72 hours.  Invalid input(s): FREET3  Other results:   Imaging    No results found.   Medications:     Scheduled Medications: . atorvastatin  80 mg Oral q1800  . carvedilol  3.125 mg Oral BID WC  . clopidogrel  75 mg Oral Daily  . digoxin  0.125 mg Oral Daily  . enoxaparin (LOVENOX) injection  1.5 mg/kg Subcutaneous Q24H  . gabapentin  300 mg Oral QHS  . insulin aspart  0-9 Units Subcutaneous TID WC  . isosorbide-hydrALAZINE  1 tablet Oral TID  . LORazepam  0.5 mg Intravenous TID  . metoCLOPramide (REGLAN) injection  5 mg  Intravenous Q8H  . ranolazine  500 mg Oral BID  . sodium chloride flush  10-40 mL Intracatheter Q12H  . warfarin  7.5 mg Oral ONCE-1800  . 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   Assessment/Plan   1. Acute on chronic systolic CHF: Ischemic cardiomyopathy. St Jude ICD.  Echo this admission with EF 20-25% (fairly stable). He was admitted with volume overload and dyspnea as well as nausea/vomiting.  He has diuresed and weight is down, CVP 8 on my measurement today.  Co-ox adequate this morning at 68%. Creatinine up to 2.1.  Mild volume overload.  - No need for milrinone at this point.  - Stop IV Lasix.  As long as creatinine stabilizes, start torsemide tomorrow.     - Continue Bidil 1 tab tid.  - Continue current Coreg and digoxin.   - No ACEI/ARB/ARNI with elevated creatinine. Would hold off on rechallenging with spironolactone until creatinine stabilized.  - He will need very close outpatient followup, has tenuous volume status.  Would consider Cardio[facility].  May be eventual LVAD candidate though cardiac output seems adequate by co-ox.  2. CAD: s/p PCI in 3/18 with DES to RCA and mid LAD. No chest pain at this time.  Troponin not significantly elevated.  - Continue Plavix.  Not on ASA due to warfarin use.  - Continue statin.  3. H/o DVTs (recurrent): None recently. Restarted warfarin.  4. Diabetes: Presented with DKA, anion gap has now closed.  Per primary service. Empagliflozin would be good choice in him for diabetes management if  insurance will cover.  5. Abdominal pain/nausea: Much improved.  Abdominal US with no gallstones.  ?Due to gastroparesis + gut edema with CHF. 6. AKI on CKD Stage 3.  Creatinine up, stopping IV Lasix.   Length of Stay: 3   Loralie Champagne, MD  04/08/2017, 12:11 PM  Advanced Heart Failure Team Pager  [phone] (M-F; 7a - 4p)  Please contact [facility] for night-coverage after hours (4p -7a ) and weekends on [email or web address]

## 2017-04-08 NOTE — Progress Notes (Signed)
ANTICOAGULATION CONSULT NOTE - Follow Up Consult  Pharmacy Consult for Warfarin Indication: recurrent DVT  Allergies  Allergen Reactions  . Metformin And Related Diarrhea    Patient Measurements: Height: 5' 7"  (170.2 cm) Weight: 184 lb 4.8 oz (83.6 kg) IBW/kg (Calculated) : 66.1  Vital Signs: Temp: 97.5 F (36.4 C) (08/04 0718) Temp Source: Oral (08/04 0718) BP: 110/92 (08/04 1100) Pulse Rate: 85 (08/04 0718)  Labs:  Recent Labs  04/05/17 2234 04/06/17 0404 04/06/17 0948 04/07/17 0500 04/07/17 0628 04/07/17 0629 04/08/17 0445  HGB  --  10.1*  --   --   --  10.2* 10.3*  HCT  --  31.1*  --   --   --  31.6* 31.8*  PLT  --  248  --   --   --  264 224  LABPROT  --  16.4*  --   --  15.4*  --  15.6*  INR  --  1.31  --   --  1.21  --  1.23  CREATININE  --  1.69* 1.63* 1.75*  --   --  2.10*  TROPONINI <0.03 0.03* <0.03  --   --   --   --     Estimated Creatinine Clearance: 42.5 mL/min (A) (by C-G formula based on SCr of 2.1 mg/dL (H)).   Medications:  Home Coumadin dose: 7.56m on Mondays and Saturdays, 521mall other days  Assessment: [age]ear old male admitted with CHF exacerbation. He is on chronic anticoagulation for recurrent DVTs with warfarin. His warfarin was held on admission and he is currently being anticoagulated and bridged with Lovenox. Warfarin was restarted 8/3.   INR today remains SUBtherapeutic (INR 1.23 << 1.21, goal of 2-3). CBC low but stable - no bleeding noted at this time.   Goal of Therapy:  INR 2-3 Anti-Xa level 0.6-1 units/ml 4hrs after LMWH dose given Monitor platelets by anticoagulation protocol: Yes   Plan:  1. Warfarin 7.5 mg x 1 dose at 1800 today 2. Will continue to monitor for any signs/symptoms of bleeding and will follow up with PT/INR in the a.m.  Thank you for allowing pharmacy to be a part of this patient's care.  ElAlycia RossettiPharmD, BCPS Clinical Pharmacist Pager: [phone] phone for 04/08/2017 from 7a-3:30p:  [phone] after 3:30p, please call main pharmacy at: [phone] 04/08/2017 11:42 AM

## 2017-04-08 NOTE — Evaluation (Signed)
Physical Therapy Evaluation Patient Details Name: [name] MRN: [id number] DOB: [birth year] Today's Date: 04/08/2017   History of Present Illness  [name] is a 50 y.o. male presenting with chest pain, nausea/vomiting x 3 days. PMH is significant for CHF (EF25-30%), NSTEMI, CKD stage 3, Chrohns, DVTs, colectomy/appendectomy/hernia repair, neoplasm of colon, hypercholesterolemia  Clinical Impression  Pt admitted with above diagnosis. Pt currently with functional limitations due to the deficits listed below (see PT Problem List). Pt very flat on eval with minimal verbalization, no family present to determine if this is his baseline. Ambulated 200' with min A and RW. Currently unsteady without AD.  Pt will benefit from skilled PT to increase their independence and safety with mobility to allow discharge to the venue listed below.       Follow Up Recommendations Home health PT    Equipment Recommendations  Rolling walker with 5" wheels    Recommendations for Other Services       Precautions / Restrictions Precautions Precautions: Fall Restrictions Weight Bearing Restrictions: No      Mobility  Bed Mobility Overal bed mobility: Needs Assistance Bed Mobility: Supine to Sit     Supine to sit: Supervision     General bed mobility comments: increased time needed, supervision for safety  Transfers Overall transfer level: Needs assistance Equipment used: Rolling walker (2 wheeled) Transfers: Sit to/from Stand Sit to Stand: Min assist         General transfer comment: vc's for hand placement. stood very slowly with mild unsteadiness with initial standing  Ambulation/Gait Ambulation/Gait assistance: Min assist;+2 safety/equipment Ambulation Distance (Feet): 200 Feet Assistive device: Rolling walker (2 wheeled) Gait Pattern/deviations: Step-through pattern Gait velocity: decreased Gait velocity interpretation: <1.8 ft/sec, indicative of risk for recurrent  falls General Gait Details: very slow gait, cued pt that he could pick up pace if he was able but he did not. HR 94 bpm, O2 sats 98% on RA  Stairs            Wheelchair Mobility    Modified Rankin (Stroke Patients Only)       Balance Overall balance assessment: Needs assistance Sitting-balance support: No upper extremity supported Sitting balance-[name]: Good     Standing balance support: Single extremity supported Standing balance-[name]: Fair Standing balance comment: pt reports he has had balance deficits intermittently in the past. Stable with use of RW                             Pertinent Vitals/Pain Pain Assessment: No/denies pain    Home Living Family/patient expects to be discharged to:: Private residence Living Arrangements: Children Available Help at Discharge: Family;Available 24 hours/day Type of Home: House Home Access: Stairs to enter   [employer] of Steps: 2 Home Layout: Multi-level;Able to live on main level with bedroom/bathroom Home Equipment: None Additional Comments: pt lives with son and daughter, one of which is with him all the time. His bedroom is downstairs but the kids have moved him to the main level so he will only have to do 2 STE    Prior Function Level of Independence: Needs assistance   Gait / Transfers Assistance Needed: has good days and bad days, reports that he does his own bathing and dressing and can sometimes drive to the store but sometimes has to stay at home  ADL's / Homemaking Assistance Needed: does all ADL's independent most of the time.  Hand Dominance   Dominant Hand: Right    Extremity/Trunk Assessment   Upper Extremity Assessment Upper Extremity Assessment: Generalized weakness    Lower Extremity Assessment Lower Extremity Assessment: Generalized weakness    Cervical / Trunk Assessment Cervical / Trunk Assessment: Normal  Communication   Communication: No  difficulties  Cognition Arousal/Alertness: Awake/alert Behavior During Therapy: Flat affect Overall Cognitive Status: No family/caregiver present to determine baseline cognitive functioning                                 General Comments: pt very flat, stoically reports that he has a lot of health issues. Appropriate throughout eval but with no conversation initiated and very brief answers to questions      General Comments General comments (skin integrity, edema, etc.): Before session HR 88 bpm, O2 sats 100% on 2L, BP 104/79 supine, 109/97 sitting.     Exercises General Exercises - Lower Extremity Ankle Circles/Pumps: AROM;Both;10 reps;Supine Heel Slides: AROM;Both;10 reps;Supine Straight Leg Raises: AROM;Both;10 reps;Supine   Assessment/Plan    PT Assessment Patient needs continued PT services  PT Problem List Decreased strength;Decreased activity tolerance;Decreased balance;Decreased mobility;Decreased knowledge of use of DME;Decreased cognition;Decreased knowledge of precautions       PT Treatment Interventions DME instruction;Gait training;Stair training;Functional mobility training;Therapeutic activities;Therapeutic exercise;Balance training;Patient/family education    PT Goals (Current goals can be found in the Care Plan section)  Acute Rehab PT Goals Patient Stated Goal: return home PT Goal Formulation: With patient Time For Goal Achievement: 04/22/17 Potential to Achieve Goals: Good    Frequency Min 3X/week   Barriers to discharge        Co-evaluation               AM-PAC PT "6 Clicks" Daily Activity  Outcome Measure Difficulty turning over in bed (including adjusting bedclothes, sheets and blankets)?: A Little Difficulty moving from lying on back to sitting on the side of the bed? : A Little Difficulty sitting down on and standing up from a chair with arms (e.g., wheelchair, bedside commode, etc,.)?: Total Help needed moving to and from a  bed to chair (including a wheelchair)?: A Little Help needed walking in hospital room?: A Little Help needed climbing 3-5 steps with a railing? : A Lot 6 Click Score: 15    End of Session Equipment Utilized During Treatment: Gait belt Activity Tolerance: Patient tolerated treatment well Patient left: in chair;with call bell/phone within reach Nurse Communication: Mobility status PT Visit Diagnosis: Unsteadiness on feet (R26.81);Muscle weakness (generalized) (M62.81)    Time: 0211-1735 PT Time Calculation (min) (ACUTE ONLY): 27 min   Charges:   PT Evaluation $PT Eval Moderate Complexity: 1 Mod PT Treatments $Gait Training: 8-22 mins   PT G Codes:        Leighton Roach, PT  Acute Rehab Services  [place] 04/08/2017, 10:10 AM

## 2017-04-08 NOTE — Progress Notes (Signed)
Family Medicine Teaching Service Daily Progress Note Intern Pager: [phone]  Patient name: [name] Medical record number: [id number] Date of birth: Oct 16, 1965 Age: 51 y.o. Gender: male  Primary Care Provider: [name], MD Consultants: Cardio, PT Code Status: Full  Pt Overview and Major Events to Date:  [name] a 51 y.o.malepresenting with chest pain, nausea/vomiting x 3 days. PMH is significant for CHF (EF25-30%), NSTEMI, CKD stage 3, Chrohns, DVTs, colectomy/appendectomy/hernia repair, neoplasm of colon, hypercholesterolemia. Significant events include PIC line 8/2 for CVP pressures.  Assessment and Plan: [name] a 51 y.o.malepresenting with chest pain, nausea/vomiting x 3 days likely caused by CHF exacerbation. PMH is significant for CHF (EF25-30%), NSTEMI, CKD stage 3, Chrohns, DVTs, colectomy/appendectomy/hernia repair, neoplasm of colon, hypercholesterolemia  Chest Pain/CHF Exacerbation,:  Admission BNP 2214.8, Digoxin level <0.2.Troponin negative 2. Chest x-ray showing signs of pulmonary vascular congestion with small right pleural effusion and trace left pleural effusion. Patient likely having CHF exacerbation again versus ACS event. HEART Score of 5.  -23M followed by cardio -EF now 20-25% (8/2 echo) -Vital signs per floor protocol, Continuous cardiac monitoring, Pulse oximetry  -CVP via PIC line per cardio -Strict I's and O's, Daily weights - Daily EKG - Continue home Reglan  -IV Lasix 80 mg BID  -IMDUR 49m daily -prn nitro 482m-renazoline 5006mD  Nausea, Vomiting:Likely due to CHF gastroperesis vs viral gastro enteritis/DKA related edema -phenrgan or zophran as tolerated  T2DM- takes 37 units of Lantus daily @home . Last hemoglobin A1c in 02/09/2017; 7.5, glucosuria >500 -Lantus 2 (8/3) -SSI sensitive -will address as labs come in with lantus -cardio suggests considering empagiflozin  CKD stage 3:  Baseline creatinine appears to be around 1.32- 2.2 (difficult to determine as he has had variable creatinine this year) however last recorded creatinine prior to this admission was 1.46 in June 2018. Cr. 1.55<1.69<1.75<2.1 -Daily BMP -Attempt to avoid nephrotoxic agents, we will need to give him Lasix though  Hypertension:Normotensive -Continue home Coreg, Imdur, digoxin per cardio -Holding home torsemide and starting IV Lasix as above -Appreciate cardiology's recommendations  Significant GIhistory: History of Crohn's disease, colectomy, diverticulosis, rectal polyp, and gastroparesis (secondary to diabetes). Hx of chronic nausea and vomiting.  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   No major events overnight  Objective: Temp:  [97.3 F (36.3 C)-98.2 F (36.8 C)] 97.5 F (36.4 C) (08/04 0718) Pulse Rate:  [73-87] 85 (08/04 0718) Resp:  [0-25] 21 (08/04 0718) BP: (89-139)/(58-99) 139/93 (08/04 0718) SpO2:  [95 %-100 %] 100 % (08/04 0718) Weight:  [184 lb 4.8 oz (83.6 kg)] 184 lb 4.8 oz (83.6 kg) (08/04 0600) Physical Exam: General: awake but appeared slow/drowsy, no pallor, minimally conversing Cardiovascular: RRR, systolic murmur, Respiratory: normal work of breathing, no obvious wheezes/rhonchi, faint crackles at bases Gastrointestinal: soft belly to exam,no tenderness expressed to palpation MSK: no pitting edema on lower extremities bilaterally,   Laboratory:  Recent  Labs Lab 04/06/17 0404 04/07/17 0629 04/08/17 0445  WBC 10.1 12.9* 7.0  HGB 10.1* 10.2* 10.3*  HCT 31.1* 31.6* 31.8*  PLT 248 264 224    Recent Labs Lab 04/05/17 2235  04/06/17 0948 04/07/17 0500 04/08/17 0445  NA  --   < > 139 139 138  K  --   < > 3.6 3.2* 3.7  CL  --   < > 100* 100* 103  CO2  --   < > 24 29 30   BUN  --   < > 31* 33* 38*  CREATININE  --   < > 1.63* 1.75* 2.10*  CALCIUM  --   < > 8.5* 8.0* 7.7*  PROT 7.4  --   --   --   --   BILITOT 1.5*  --   --   --   --   ALKPHOS 130*  --   --   --   --   ALT 19  --   --   --   --   AST 25  --   --   --   --   GLUCOSE  --   < > 159* 178* 247*  < > = values in this interval not displayed.    Imaging/Diagnostic Tests: Dg Chest 2 View  Result Date: 04/05/2017 CLINICAL DATA:  Four days of sharp sub sternal non radiating chest discomfort. Two days of nausea and vomiting with inability to take medications. History of diabetes, hypertension, ischemic cardiomyopathy with dysrhythmia is, previous MI, nonsmoker. EXAM: CHEST  2 VIEW COMPARISON:  PA and lateral chest x-ray of February 11, 2017 FINDINGS: The lungs are mildly hypoinflated. There is a new small right pleural effusion as well as trace left pleural effusion. The cardiac silhouette remains enlarged. The pulmonary vascularity is more engorged and there is cephalization of the vascular pattern. The ICD is in stable position. The bony thorax is unremarkable. The gas pattern in the upper abdomen is nonspecific. IMPRESSION: CHF with pulmonary vascular congestion and new small right and trace left pleural effusions. The appearance of the chest has deteriorated since the previous study. Electronically Signed   By: David  Martinique M.D.   On: 04/05/2017 12:15   Dg Abd 2 Views  Result Date: 04/05/2017 CLINICAL DATA:  Nausea and vomiting. EXAM: ABDOMEN - 2 VIEW COMPARISON:  11/17/2011 abdominal radiograph. 02/11/2008 CT abdomen and pelvis. FINDINGS: There is no evidence of intraperitoneal free  air. Scattered colonic gas is present (including in sigmoid colon which courses into the right lower quadrant). No dilated loops of bowel are seen to suggest obstruction. An ICD is partially visualized. Lung findings, including a small right pleural effusion, are more fully evaluated on today's earlier chest radiographs. IMPRESSION: Nonobstructed bowel gas pattern. Electronically Signed   By: Logan Bores M.D.   On: 04/05/2017 17:04   US Abdomen Limited Ruq  Result Date: 04/06/2017 CLINICAL DATA:  Nausea and vomiting for 3 days. History of CHF, Crohn's disease, hypertension, diabetes. Previous colectomy, appendectomy, hernia repair. EXAM: ULTRASOUND ABDOMEN LIMITED RIGHT UPPER QUADRANT COMPARISON:  CT abdomen and pelvis 02/11/2008. Abdominal radiographs 04/05/2017 FINDINGS: Gallbladder: No gallstones or wall thickening visualized. No sonographic Murphy sign noted by sonographer. Common bile duct: Diameter: 4.1 mm, normal Liver: Heterogeneous increased parenchymal echotexture suggesting fatty infiltration of the liver. No focal lesions identified. Limited visualization of the liver due to rib shadowing. IMPRESSION: No evidence of cholelithiasis or cholecystitis. Probable fatty infiltration of the liver. Electronically Signed   By: Lucienne Capers M.D.   On: 04/06/2017 23:05     Sherene Sires, DO 04/08/2017, 10:25 AM PGY-1, [place]  [facility] FPTS Intern pager: [phone], text pages welcome

## 2017-04-08 NOTE — Plan of Care (Signed)
Problem: Health Behavior/Discharge Planning: Goal: Ability to manage health-related needs will improve Outcome: Progressing Discussed with patient plan of care for the evening and importance in taking medications and following-up with his doctors after discharge with some teach back displayed.

## 2017-04-09 DIAGNOSIS — R112 Nausea with vomiting, unspecified: Secondary | ICD-10-CM

## 2017-04-09 LAB — CBC
HCT: 32.5 % — ABNORMAL LOW (ref 39.0–52.0)
Hemoglobin: 10.3 g/dL — ABNORMAL LOW (ref 13.0–17.0)
MCH: 25.9 pg — ABNORMAL LOW (ref 26.0–34.0)
MCHC: 31.7 g/dL (ref 30.0–36.0)
MCV: 81.7 fL (ref 78.0–100.0)
PLATELETS: 243 10*3/uL (ref 150–400)
RBC: 3.98 MIL/uL — AB (ref 4.22–5.81)
RDW: 14.6 % (ref 11.5–15.5)
WBC: 7 10*3/uL (ref 4.0–10.5)

## 2017-04-09 LAB — BASIC METABOLIC PANEL
Anion gap: 9 (ref 5–15)
BUN: 36 mg/dL — AB (ref 6–20)
CALCIUM: 8 mg/dL — AB (ref 8.9–10.3)
CO2: 29 mmol/L (ref 22–32)
Chloride: 98 mmol/L — ABNORMAL LOW (ref 101–111)
Creatinine, Ser: 1.96 mg/dL — ABNORMAL HIGH (ref 0.61–1.24)
GFR calc Af Amer: 44 mL/min — ABNORMAL LOW (ref >60)
GFR, EST NON AFRICAN AMERICAN: 38 mL/min — AB (ref >60)
Glucose, Bld: 177 mg/dL — ABNORMAL HIGH (ref 65–99)
POTASSIUM: 3.5 mmol/L (ref 3.5–5.1)
SODIUM: 136 mmol/L (ref 135–145)

## 2017-04-09 LAB — GLUCOSE, CAPILLARY
GLUCOSE-CAPILLARY: 184 mg/dL — AB (ref 65–99)
GLUCOSE-CAPILLARY: 176 mg/dL — AB (ref 65–99)
Glucose-Capillary: 195 mg/dL — ABNORMAL HIGH (ref 65–99)
Glucose-Capillary: 197 mg/dL — ABNORMAL HIGH (ref 65–99)

## 2017-04-09 LAB — COOXEMETRY PANEL
CARBOXYHEMOGLOBIN: 1.4 % (ref 0.5–1.5)
Methemoglobin: 0.9 % (ref 0.0–1.5)
O2 Saturation: 67.9 %
TOTAL HEMOGLOBIN: 10.6 g/dL — AB (ref 12.0–16.0)

## 2017-04-09 LAB — PROTIME-INR
INR: 1.14
PROTHROMBIN TIME: 14.7 s (ref 11.4–15.2)

## 2017-04-09 MED ORDER — TORSEMIDE 20 MG PO TABS
40.0000 mg | ORAL_TABLET | Freq: Every evening | ORAL | Status: DC
  Administered 2017-04-10: 40 mg via ORAL
  Filled 2017-04-09: qty 2

## 2017-04-09 MED ORDER — TORSEMIDE 20 MG PO TABS
80.0000 mg | ORAL_TABLET | Freq: Once | ORAL | Status: AC
  Administered 2017-04-09: 80 mg via ORAL
  Filled 2017-04-09: qty 4

## 2017-04-09 MED ORDER — SPIRONOLACTONE 25 MG PO TABS
12.5000 mg | ORAL_TABLET | Freq: Every day | ORAL | Status: DC
  Administered 2017-04-09 – 2017-04-13 (×5): 12.5 mg via ORAL
  Filled 2017-04-09 (×5): qty 1

## 2017-04-09 MED ORDER — TORSEMIDE 20 MG PO TABS
80.0000 mg | ORAL_TABLET | Freq: Every day | ORAL | Status: DC
  Administered 2017-04-10: 80 mg via ORAL
  Filled 2017-04-09 (×3): qty 4

## 2017-04-09 MED ORDER — GABAPENTIN 300 MG PO CAPS
300.0000 mg | ORAL_CAPSULE | Freq: Two times a day (BID) | ORAL | Status: DC
  Administered 2017-04-09 – 2017-04-10 (×3): 300 mg via ORAL
  Filled 2017-04-09 (×3): qty 1

## 2017-04-09 MED ORDER — TORSEMIDE 20 MG PO TABS
40.0000 mg | ORAL_TABLET | Freq: Every evening | ORAL | Status: DC
  Filled 2017-04-09: qty 2

## 2017-04-09 MED ORDER — WARFARIN SODIUM 5 MG PO TABS
10.0000 mg | ORAL_TABLET | Freq: Once | ORAL | Status: AC
  Administered 2017-04-09: 10 mg via ORAL
  Filled 2017-04-09: qty 2

## 2017-04-09 NOTE — Progress Notes (Addendum)
Family Medicine Teaching Service Daily Progress Note Intern Pager: [phone]  Patient name: [name] Medical record number: [id number] Date of birth: 27-Nov-1965 Age: 51 y.o. Gender: male  Primary Care Provider: [name], MD Consultants: Cardio, PT Code Status: Full  Pt Overview and Major Events to Date:  [name] a 51 y.o.malepresenting with chest pain, nausea/vomiting x 3 days. PMH is significant for CHF (EF25-30%), NSTEMI, CKD stage 3, Chrohns, DVTs, colectomy/appendectomy/hernia repair, neoplasm of colon, hypercholesterolemia. Significant events include PIC line 8/2 for CVP pressures.  Assessment and Plan: [name] a 51 y.o.malepresenting with chest pain, nausea/vomiting x 3 days likely caused by CHF exacerbation. PMH is significant for CHF (EF25-30%), NSTEMI, CKD stage 3, Chrohns, DVTs, colectomy/appendectomy/hernia repair, neoplasm of colon, hypercholesterolemia  Chest Pain/CHF Exacerbation,:  Admission BNP 2214.8, Digoxin level <0.2.Troponin negative 2. Chest x-ray showing signs of pulmonary vascular congestion with small right pleural effusion and trace left pleural effusion. Patient likely having CHF exacerbation again versus ACS event. HEART Score of 5.  -40M followed by cardio -EF now 20-25% (8/2 echo) -Vital signs per floor protocol, Continuous cardiac monitoring, Pulse oximetry  -CVP via PIC line per cardio -per cardiology, possible candidate for LVAD -Strict I's and O's, Daily weights - Daily EKG - Continue home Reglan  -d/c lasix per Cardiology  -IMDUR 87m daily -prn nitro 463m-renazoline 50092mD -palliative consult appreciated  Nausea, Vomiting: Likely due to CHF gastroperesis vs viral gastro enteritis/DKA related edema -mostly resolved. -phenrgan or zophran prn  T2DM- takes 37 units of Lantus daily @home . Last hemoglobin A1c in 02/09/2017; 7.5, glucosuria >500 -Lantus 2 (8/3) -SSI sensitive -will  address as labs come in with lantus -cardio suggests considering empagiflozin  Diabetic Neuropathy -currently getting home dosage gabapentin 300 mg POQD at night -consider increasing dose to 300 mg BID as tolerated regarding sedation  CKD stage 3: Baseline creatinine appears to be around 1.32- 2.2 (difficult to determine as he has had variable creatinine this year) however last recorded creatinine prior to this admission was 1.46 in June 2018.  Lab Results  Component Value Date   CREATININE 1.96 (H) 04/09/2017   CREATININE 2.10 (H) 04/08/2017   CREATININE 1.75 (H) 04/07/2017  -Daily BMP -per cards, avoid nephrotoxic agents  Hypertension:Normotensive -Continue home Coreg, Imdur, digoxin per cardio -Holding home torsemide -per cardiology, hold lasix and ACE/ARB/ARNI w/ elevated Cr  Significant GIhistory: History of Crohn's disease, colectomy, diverticulosis, rectal polyp, and gastroparesis (secondary to diabetes). Hx of chronic nausea and vomiting.  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 H/o diabetic neuropathy. No other complaints. Denies N/V/abdominal pain/SOB.   Objective: Temp:  [97.3 F (36.3 C)-98.1 F (36.7 C)] 97.3 F (36.3 C) (08/05 0700) Pulse Rate:  [29-86] 85 (08/05 0700) Resp:  [14-21] 14 (08/05 0358) BP: (98-139)/(56-96) 108/91 (08/05 0700) SpO2:  [94 %-100 %] 100 % (08/05 0700) Weight:  [190 lb 9.6 oz (86.5 kg)] 190 lb 9.6 oz (86.5 kg) (08/05 0409) Physical Exam: General: awake, NAD Cardiovascular: RRR, systolic murmur, Respiratory: normal  work of breathing, no obvious wheezes/rhonchi, faint crackles at bases Gastrointestinal: soft belly to exam,no tenderness expressed to palpation MSK: no pitting  edema on lower extremities bilaterally, balls of his feet are tender to light sensation  Laboratory:  Recent Labs Lab 04/07/17 0629 04/08/17 0445 04/09/17 0355  WBC 12.9* 7.0 7.0  HGB 10.2* 10.3* 10.3*  HCT 31.6* 31.8* 32.5*  PLT 264 224 243    Recent Labs Lab 04/05/17 2235  04/07/17 0500 04/08/17 0445 04/09/17 0355  NA  --   < > 139 138 136  K  --   < > 3.2* 3.7 3.5  CL  --   < > 100* 103 98*  CO2  --   < > 29 30 29   BUN  --   < > 33* 38* 36*  CREATININE  --   < > 1.75* 2.10* 1.96*  CALCIUM  --   < > 8.0* 7.7* 8.0*  PROT 7.4  --   --   --   --   BILITOT 1.5*  --   --   --   --   ALKPHOS 130*  --   --   --   --   ALT 19  --   --   --   --   AST 25  --   --   --   --   GLUCOSE  --   < > 178* 247* 177*  < > = values in this interval not displayed.    Imaging/Diagnostic Tests: Dg Chest 2 View  Result Date: 04/05/2017 CLINICAL DATA:  Four days of sharp sub sternal non radiating chest discomfort. Two days of nausea and vomiting with inability to take medications. History of diabetes, hypertension, ischemic cardiomyopathy with dysrhythmia is, previous MI, nonsmoker. EXAM: CHEST  2 VIEW COMPARISON:  PA and lateral chest x-ray of February 11, 2017 FINDINGS: The lungs are mildly hypoinflated. There is a new small right pleural effusion as well as trace left pleural effusion. The cardiac silhouette remains enlarged. The pulmonary vascularity is more engorged and there is cephalization of the vascular pattern. The ICD is in stable position. The bony thorax is unremarkable. The gas pattern in the upper abdomen is nonspecific. IMPRESSION: CHF with pulmonary vascular congestion and new small right and trace left pleural effusions. The appearance of the chest has deteriorated since the previous study. Electronically Signed   By: David   Martinique M.D.   On: 04/05/2017 12:15   Dg Abd 2 Views  Result Date: 04/05/2017 CLINICAL DATA:  Nausea and vomiting. EXAM: ABDOMEN - 2 VIEW COMPARISON:  11/17/2011 abdominal radiograph. 02/11/2008 CT abdomen and pelvis. FINDINGS: There is no evidence of intraperitoneal free air. Scattered colonic gas is present (including in sigmoid colon which courses into the right lower quadrant). No dilated loops of bowel are seen to suggest obstruction. An ICD is partially visualized. Lung findings, including a small right pleural effusion, are more fully evaluated on today's earlier chest radiographs. IMPRESSION: Nonobstructed bowel gas pattern. Electronically Signed   By: Logan Bores M.D.   On: 04/05/2017 17:04   US Abdomen Limited Ruq  Result Date: 04/06/2017 CLINICAL DATA:  Nausea and vomiting for 3 days. History of CHF, Crohn's disease, hypertension, diabetes. Previous colectomy, appendectomy, hernia repair. EXAM: ULTRASOUND ABDOMEN LIMITED RIGHT UPPER QUADRANT COMPARISON:  CT abdomen and pelvis 02/11/2008. Abdominal radiographs 04/05/2017 FINDINGS: Gallbladder: No gallstones or wall thickening visualized. No sonographic Murphy sign noted by sonographer. Common bile duct: Diameter: 4.1 mm, normal Liver: Heterogeneous increased parenchymal echotexture suggesting fatty infiltration of the liver. No focal lesions identified. Limited visualization of the liver due to rib  shadowing. IMPRESSION: No evidence of cholelithiasis or cholecystitis. Probable fatty infiltration of the liver. Electronically Signed   By: Lucienne Capers M.D.   On: 04/06/2017 23:05     Bonnita Hollow, MD 04/09/2017, 7:08 AM PGY-1, [place] Intern pager: [phone], text pages welcome

## 2017-04-09 NOTE — Progress Notes (Signed)
Patient ID: [name], male   DOB: 1966-03-01, 51 y.o.   MRN: [id number]     Advanced Heart Failure Rounding Note  Primary Cardiologist: Aundra Dubin  Subjective:    Overall weight down a few lbs.  He feels considerably better, not dyspneic.  CVP 8 yesterday, CVP line not working this morning.  Co-ox remains adequate at 68%.  Creatinine 2.1 => 1.96. Nausea/vomiting have resolved.   Abdominal US: No gallstones/cholecystitis, fatty liver.   Echo: EF 20-25%, diffuse hypokinesis, mild MR.   Objective:   Weight Range: 190 lb 9.6 oz (86.5 kg) Body mass index is 29.85 kg/m.   Vital Signs:   Temp:  [97.3 F (36.3 C)-98.1 F (36.7 C)] 97.3 F (36.3 C) (08/05 0700) Pulse Rate:  [29-86] 86 (08/05 0855) Resp:  [14-21] 14 (08/05 0358) BP: (98-130)/(56-96) 108/91 (08/05 0700) SpO2:  [94 %-100 %] 100 % (08/05 0700) Weight:  [190 lb 9.6 oz (86.5 kg)] 190 lb 9.6 oz (86.5 kg) (08/05 0409) Last BM Date: 04/08/17  Weight change: Filed Weights   04/07/17 0800 04/08/17 0600 04/09/17 0409  Weight: 189 lb 6 oz (85.9 kg) 184 lb 4.8 oz (83.6 kg) 190 lb 9.6 oz (86.5 kg)    Intake/Output:   Intake/Output Summary (Last 24 hours) at 04/09/17 1048 Last data filed at 04/08/17 1900  Gross per 24 hour  Intake              240 ml  Output              700 ml  Net             -460 ml      Physical Exam    General: NAD Neck: JVP 8-9 cm, no thyromegaly or thyroid nodule.  Lungs: Clear to auscultation bilaterally with normal respiratory effort. CV: Nondisplaced PMI.  Heart regular S1/S2, no S3/S4, no murmur.  No peripheral edema.   Abdomen: Soft, nontender, no hepatosplenomegaly, no distention.  Skin: Intact without lesions or rashes.  Neurologic: Alert and oriented x 3.  Psych: Normal affect. Extremities: No clubbing or cyanosis.  HEENT: Normal.    Telemetry   Personally reviewed.  NSR  EKG    NSR with nonspecific T wave changes.   Labs    CBC  Recent Labs  04/08/17 0445  04/09/17 0355  WBC 7.0 7.0  HGB 10.3* 10.3*  HCT 31.8* 32.5*  MCV 83.2 81.7  PLT 224 334   Basic Metabolic Panel  Recent Labs  04/08/17 0445 04/09/17 0355  NA 138 136  K 3.7 3.5  CL 103 98*  CO2 30 29  GLUCOSE 247* 177*  BUN 38* 36*  CREATININE 2.10* 1.96*  CALCIUM 7.7* 8.0*  MG 1.8  --   PHOS 4.1  --    Liver Function Tests No results for input(s): AST, ALT, ALKPHOS, BILITOT, PROT, ALBUMIN in the last 72 hours.  Recent Labs  04/07/17 0500  LIPASE 17   Cardiac Enzymes No results for input(s): CKTOTAL, CKMB, CKMBINDEX, TROPONINI in the last 72 hours.  BNP: BNP (last 3 results)  Recent Labs  01/17/17 1204 02/08/17 1250 04/05/17 1510  BNP 353.6* 1,330.6* 2,214.8*    ProBNP (last 3 results) No results for input(s): PROBNP in the last 8760 hours.   D-Dimer No results for input(s): DDIMER in the last 72 hours. Hemoglobin A1C No results for input(s): HGBA1C in the last 72 hours. Fasting Lipid Panel No results for input(s): CHOL, HDL, LDLCALC, TRIG, CHOLHDL, LDLDIRECT  in the last 72 hours. Thyroid Function Tests No results for input(s): TSH, T4TOTAL, T3FREE, THYROIDAB in the last 72 hours.  Invalid input(s): FREET3  Other results:   Imaging    No results found.   Medications:     Scheduled Medications: . atorvastatin  80 mg Oral q1800  . carvedilol  3.125 mg Oral BID WC  . clopidogrel  75 mg Oral Daily  . digoxin  0.125 mg Oral Daily  . enoxaparin (LOVENOX) injection  1.5 mg/kg Subcutaneous Q24H  . gabapentin  300 mg Oral QHS  . insulin aspart  0-9 Units Subcutaneous TID WC  . isosorbide-hydrALAZINE  1 tablet Oral TID  . LORazepam  0.5 mg Intravenous TID  . metoCLOPramide (REGLAN) injection  5 mg Intravenous Q8H  . ranolazine  500 mg Oral BID  . sodium chloride flush  10-40 mL Intracatheter Q12H  . torsemide  40 mg Oral QPM  . torsemide  80 mg Oral Q breakfast  . Warfarin - Pharmacist Dosing Inpatient   Does not apply q1800     Infusions:   PRN Medications: acetaminophen **OR** acetaminophen, morphine injection, nitroGLYCERIN, ondansetron (ZOFRAN) IV, promethazine, sodium chloride flush    Patient Profile   51 yo with history of CAD s/p most recent PCI in 7/41, chronic systolic CHF/ischemic cardiomyopathy, type II diabetes with gastroparesis, recurrent DVTs on warfarin was admitted with acute on chronic systolic CHF, DKA, and abdominal discomfort/nausea.   Assessment/Plan   1. Acute on chronic systolic CHF: Ischemic cardiomyopathy. St Jude ICD.  Echo this admission with EF 20-25% (fairly stable). He was admitted with volume overload and dyspnea as well as nausea/vomiting.  He has diuresed CVP fell as of yesterday, line not working today.  He feels much better this morning.  Co-ox adequate this morning at 68%. Creatinine down after holding diuretics yesterday.  Minimal volume overload by exam.  - No need for milrinone at this point.  - Start torsemide 80 qam/40 qpm (was on 80 mg daily at home).  - Will try to get CVP line working, will need to change.      - Continue Bidil 1 tab tid.  - Continue current Coreg and digoxin.   - No ACEI/ARB/ARNI with elevated creatinine. Can start spironolactone 12.5 daily.   - He will need very close outpatient followup, has tenuous volume status.  Would consider [facility]io[facility].  May be eventual LVAD candidate though cardiac output seems adequate by co-ox.  2. CAD: s/p PCI in 3/18 with DES to RCA and mid LAD. No chest pain at this time.  Troponin not significantly elevated.  - Continue Plavix.  Not on ASA due to warfarin use.  - Continue statin.  3. H/o DVTs (recurrent): None recently. Restarted warfarin.  4. Diabetes: Presented with DKA, anion gap has now closed.  Per primary service. Empagliflozin would be good choice in him for diabetes management if insurance will cover.  5. Abdominal pain/nausea: Much improved.  Abdominal US with no gallstones.  ?Due to gastroparesis + gut  edema with CHF. 6. AKI on CKD Stage 3.  Creatinine down from yesterday, will start back on torsemide.   If CVP not markedly high when able to check, can go to telemetry.    Length of Stay: [place], MD  04/09/2017, 10:48 AM  Advanced Heart Failure Team Pager [phone] (M-F; 7a - 4p)  Please contact [facility] for night-coverage after hours (4p -7a ) and weekends on [email or web address]

## 2017-04-09 NOTE — Progress Notes (Signed)
ANTICOAGULATION CONSULT NOTE - Follow Up Consult  Pharmacy Consult for Enox + Warfarin Indication: recurrent DVT  Allergies  Allergen Reactions  . Metformin And Related Diarrhea    Patient Measurements: Height: 5' 7"  (170.2 cm) Weight: 190 lb 9.6 oz (86.5 kg) IBW/kg (Calculated) : 66.1  Vital Signs: Temp: 97.3 F (36.3 C) (08/05 0700) Temp Source: Axillary (08/05 0700) BP: 108/91 (08/05 0700) Pulse Rate: 86 (08/05 0855)  Labs:  Recent Labs  04/07/17 0500 04/07/17 0628  04/07/17 0629 04/08/17 0445 04/09/17 0355  HGB  --   --   < > 10.2* 10.3* 10.3*  HCT  --   --   --  31.6* 31.8* 32.5*  PLT  --   --   --  264 224 243  LABPROT  --  15.4*  --   --  15.6* 14.7  INR  --  1.21  --   --  1.23 1.14  CREATININE 1.75*  --   --   --  2.10* 1.96*  < > = values in this interval not displayed.  Estimated Creatinine Clearance: 46.3 mL/min (A) (by C-G formula based on SCr of 1.96 mg/dL (H)).   Medications:  Home Coumadin dose: 7.91m on Mondays and Saturdays, 561mall other days  Assessment: [age]ear old male admitted with CHF exacerbation. He is on chronic anticoagulation for recurrent DVTs with warfarin. His warfarin was held on admission and he is currently being anticoagulated and bridged with Lovenox. Warfarin was restarted 8/3.   INR today remains SUBtherapeutic (INR 1.14 << 1.23, goal of 2-3). CBC low but stable - no bleeding noted at this time.   Goal of Therapy:  INR 2-3 Anti-Xa level 0.6-1 units/ml 4hrs after LMWH dose given Monitor platelets by anticoagulation protocol: Yes   Plan:  1. Warfarin 10 mg x 1 dose at 1800 today 2. Continue Lovenox 1.5 mg/kg (~130 mg) SQ daily for bridging.  3. Will continue to monitor for any signs/symptoms of bleeding and will follow up with PT/INR in the a.m.  Thank you for allowing pharmacy to be a part of this patient's care.  ElAlycia RossettiPharmD, BCPS Clinical Pharmacist Pager: [phone] phone for 04/09/2017 from  7a-3:30p: [phone] after 3:30p, please call main pharmacy at: [phone] 04/09/2017 11:12 AM

## 2017-04-09 NOTE — Progress Notes (Signed)
CVP line fixed, reading 10, MD [facility] paged with results.

## 2017-04-10 ENCOUNTER — Encounter (HOSPITAL_COMMUNITY): Payer: BLUE CROSS/BLUE SHIELD

## 2017-04-10 ENCOUNTER — Encounter (HOSPITAL_COMMUNITY)
Admission: RE | Admit: 2017-04-10 | Payer: BLUE CROSS/BLUE SHIELD | Source: Ambulatory Visit | Attending: Cardiology | Admitting: Cardiology

## 2017-04-10 LAB — COOXEMETRY PANEL
Carboxyhemoglobin: 1.1 % (ref 0.5–1.5)
METHEMOGLOBIN: 1.3 % (ref 0.0–1.5)
O2 Saturation: 71.5 %
Total hemoglobin: 10.3 g/dL — ABNORMAL LOW (ref 12.0–16.0)

## 2017-04-10 LAB — BASIC METABOLIC PANEL
ANION GAP: 7 (ref 5–15)
BUN: 30 mg/dL — ABNORMAL HIGH (ref 6–20)
CALCIUM: 8.1 mg/dL — AB (ref 8.9–10.3)
CHLORIDE: 100 mmol/L — AB (ref 101–111)
CO2: 29 mmol/L (ref 22–32)
Creatinine, Ser: 1.79 mg/dL — ABNORMAL HIGH (ref 0.61–1.24)
GFR calc non Af Amer: 42 mL/min — ABNORMAL LOW (ref >60)
GFR, EST AFRICAN AMERICAN: 49 mL/min — AB (ref >60)
GLUCOSE: 243 mg/dL — AB (ref 65–99)
POTASSIUM: 3.7 mmol/L (ref 3.5–5.1)
Sodium: 136 mmol/L (ref 135–145)

## 2017-04-10 LAB — CBC
HEMATOCRIT: 31.6 % — AB (ref 39.0–52.0)
HEMOGLOBIN: 10.2 g/dL — AB (ref 13.0–17.0)
MCH: 26.3 pg (ref 26.0–34.0)
MCHC: 32.3 g/dL (ref 30.0–36.0)
MCV: 81.4 fL (ref 78.0–100.0)
Platelets: 226 10*3/uL (ref 150–400)
RBC: 3.88 MIL/uL — AB (ref 4.22–5.81)
RDW: 14.6 % (ref 11.5–15.5)
WBC: 5.5 10*3/uL (ref 4.0–10.5)

## 2017-04-10 LAB — GLUCOSE, CAPILLARY
GLUCOSE-CAPILLARY: 255 mg/dL — AB (ref 65–99)
GLUCOSE-CAPILLARY: 118 mg/dL — AB (ref 65–99)
GLUCOSE-CAPILLARY: 181 mg/dL — AB (ref 65–99)
GLUCOSE-CAPILLARY: 163 mg/dL — AB (ref 65–99)
Glucose-Capillary: 180 mg/dL — ABNORMAL HIGH (ref 65–99)

## 2017-04-10 LAB — PROTIME-INR
INR: 1.34
Prothrombin Time: 16.7 seconds — ABNORMAL HIGH (ref 11.4–15.2)

## 2017-04-10 MED ORDER — GABAPENTIN 300 MG PO CAPS: 300.0000 mg | ORAL_CAPSULE | Freq: Three times a day (TID) | ORAL | Status: DC

## 2017-04-10 MED ORDER — GABAPENTIN 300 MG PO CAPS
600.0000 mg | ORAL_CAPSULE | Freq: Two times a day (BID) | ORAL | Status: DC
  Administered 2017-04-10 – 2017-04-13 (×6): 600 mg via ORAL
  Filled 2017-04-10 (×6): qty 2

## 2017-04-10 MED ORDER — WARFARIN SODIUM 5 MG PO TABS
10.0000 mg | ORAL_TABLET | Freq: Once | ORAL | Status: AC
  Administered 2017-04-10: 10 mg via ORAL
  Filled 2017-04-10: qty 2

## 2017-04-10 MED ORDER — LORAZEPAM 0.5 MG PO TABS
0.5000 mg | ORAL_TABLET | Freq: Three times a day (TID) | ORAL | Status: DC
  Administered 2017-04-10 – 2017-04-11 (×3): 0.5 mg via ORAL
  Filled 2017-04-10 (×3): qty 1

## 2017-04-10 NOTE — Plan of Care (Signed)
Problem: Cardiac: Goal: Ability to achieve and maintain adequate cardiopulmonary perfusion will improve Outcome: Progressing Pt has maintained oxygen saturations >92%. Pt received diuretics and has had adequate urine output.

## 2017-04-10 NOTE — Progress Notes (Signed)
Family Medicine Teaching Service Daily Progress Note Intern Pager: [phone]  Patient name: [name] Medical record number: [id number] Date of birth: 1965/12/03 Age: 51 y.o. Gender: male  Primary Care Provider: [name], MD Consultants: cardio Code Status: full  Overview and major events to date [name] a 51 y.o.malepresenting with chest pain, nausea/vomiting x 3 days. PMH is significant for CHF (EF25-30%), NSTEMI, CKD stage 3, Chrohns, DVTs, colectomy/appendectomy/hernia repair, neoplasm of colon, hypercholesterolemia. Significant events include PIC line 8/2 for CVP pressures.  Transferred to telemetry on 8/6.  Assessment and Plan: [place] E [name] a 51 y.o.malepresenting with chest pain, nausea/vomiting x 3 days likely caused by CHF exacerbation. PMH is significant for CHF (EF25-30%), NSTEMI, CKD stage 3, Chrohns, DVTs, colectomy/appendectomy/hernia repair, neoplasm of colon, hypercholesterolemia  Chest Pain/CHF Exacerbation,: Admission BNP 2214.8, Digoxin level <0.2.Troponin negative 2. Chest x-ray showing signs of pulmonary vascular congestion with small right pleural effusion and trace left pleural effusion. Patient likely having CHF exacerbation again versus ACS event. HEART Score of 5.  -31M followed by cardio -EF now 20-25% (8/2 echo) -Vital signs per floor protocol, Continuous cardiac monitoring, Pulse oximetry  -CVP via PIC line per cardio -per cardiology, possible candidate for LVAD -Strict I's and O's, Daily weights - Daily EKG - Reglan  -torsemide (held today due to volume loss via diarrhea) -carvedilol -digoxin -IMDUR 61m daily -prn nitro 44m-renazoline 50060mD -spironolactone -palliative consult appreciated -Bidil TID transferring to telemetry  Nausea, Vomiting: Likely due to CHF gastroperesis vs viral gastro enteritis/DKA related edema -mostly resolved. -phenrgan or zophran prn -ativan prn  T2DM-  takes 37 units of Lantus daily @home . Last hemoglobin A1c in 02/09/2017; 7.5, glucosuria >500 -Lantus 2 (8/3) -SSI sensitive -will address as labs come in with lantus -cardio suggests considering empagiflozin  Diabetic Neuropathy -increased to 600m58mD gabapentin on 8/6  CKD stage 3: Baseline creatinine appears to be around 1.32- 2.2 (difficult to determine as he has had variable creatinine this year) however last recorded creatinine prior to this admission was 1.46 in June 2018.  - most recent creatinine 2.0 -Daily BMP -per cards, avoid nephrotoxic agents -spirinolactone  Hypertension:Normotensive -Continue home Coreg, Imdur, digoxin per cardio -Holding home torsemide -per cardiology, hold lasix and ACE/ARB/ARNI w/ elevated Cr  Significant GIhistory: History of Crohn's disease, colectomy, diverticulosis, rectal polyp, and gastroparesis (secondary to diabetes). Hx of chronic nausea and vomiting.  -Continue home Reglan -Phenergan prn -Abdominal imaging as above  Diarrhea -reported started evening 8/6, 5-6 watery stool w/o signs of blood -afebrile, but is on tylenol..no abx and home history of chronic diarrhea 2/2 crohns makes cdiff less likely, but white count did increase suddenly to 17 which makes infectious cause more likely -quick c.diff. And gastoenteric panel - enteric precautions -patient thinks it is connected to drinking "unfiltered water" at hospital, only drinks bottle water at home.  History of recurrent DVTs: On Coumadin at home -Coumadin per pharmacy bridging from lovenox -plavix  Hyperlipidemia:  - Continue home atorvastatin  Pain -tylenol prn  FEN/GI: renal/carb diet Prophylaxis: coumadin per pharmacy  Disposition:home pending Cardiology/FM clearance    Subjective:  Leg pain improved on increased gabapentin but had new onset (for hospital stay) diarrhea 5-6 times non-bloody that he thinks is connected to not drinking bottles water.   No  vomiting but some nausea.  Objective: Temp:  [97.9 F (36.6 C)-100.1 F (37.8 C)] 100.1 F (37.8 C) (08/07 0544) Pulse Rate:  [83-106] 106 (08/07 0544) Resp:  [18-21] 18 (08/07  0544) BP: (98-126)/(62-88) 98/62 (08/07 0544) SpO2:  [97 %-100 %] 100 % (08/07 0544) Weight:  [174 lb 14.4 oz (79.3 kg)-179 lb 4.8 oz (81.3 kg)] 174 lb 14.4 oz (79.3 kg) (08/07 0544) Physical Exam: General: awake, NAD Cardiovascular: Regular rhythm but tachy at about 629-528, systolic murmur Respiratory: normal work of breathing, no obvious rhonchi Gastrointestinal: soft belly to exam,no tenderness expressed to palpation MSK: no pitting edema on lower extremities bilaterally,no pain in feet  Laboratory:  Recent Labs Lab 04/08/17 0445 04/09/17 0355 04/10/17 0451  WBC 7.0 7.0 5.5  HGB 10.3* 10.3* 10.2*  HCT 31.8* 32.5* 31.6*  PLT 224 243 226    Recent Labs Lab 04/05/17 2235  04/09/17 0355 04/10/17 0451 04/11/17 0356  NA  --   < > 136 136 137  K  --   < > 3.5 3.7 4.1  CL  --   < > 98* 100* 101  CO2  --   < > 29 29 26   BUN  --   < > 36* 30* 37*  CREATININE  --   < > 1.96* 1.79* 2.00*  CALCIUM  --   < > 8.0* 8.1* 8.7*  PROT 7.4  --   --   --   --   BILITOT 1.5*  --   --   --   --   ALKPHOS 130*  --   --   --   --   ALT 19  --   --   --   --   AST 25  --   --   --   --   GLUCOSE  --   < > 177* 243* 157*  < > = values in this interval not displayed.    Imaging/Diagnostic Tests: Dg Chest 2 View  Result Date: 04/05/2017 CLINICAL DATA:  Four days of sharp sub sternal non radiating chest discomfort. Two days of nausea and vomiting with inability to take medications. History of diabetes, hypertension, ischemic cardiomyopathy with dysrhythmia is, previous MI, nonsmoker. EXAM: CHEST  2 VIEW COMPARISON:  PA and lateral chest x-ray of February 11, 2017 FINDINGS: The lungs are mildly hypoinflated. There is a new small right pleural effusion as well as trace left pleural effusion. The cardiac silhouette  remains enlarged. The pulmonary vascularity is more engorged and there is cephalization of the vascular pattern. The ICD is in stable position. The bony thorax is unremarkable. The gas pattern in the upper abdomen is nonspecific. IMPRESSION: CHF with pulmonary vascular congestion and new small right and trace left pleural effusions. The appearance of the chest has deteriorated since the previous study. Electronically Signed   By: David  Martinique M.D.   On: 04/05/2017 12:15   Dg Abd 2 Views  Result Date: 04/05/2017 CLINICAL DATA:  Nausea and vomiting. EXAM: ABDOMEN - 2 VIEW COMPARISON:  11/17/2011 abdominal radiograph. 02/11/2008 CT abdomen and pelvis. FINDINGS: There is no evidence of intraperitoneal free air. Scattered colonic gas is present (including in sigmoid colon which courses into the right lower quadrant). No dilated loops of bowel are seen to suggest obstruction. An ICD is partially visualized. Lung findings, including a small right pleural effusion, are more fully evaluated on today's earlier chest radiographs. IMPRESSION: Nonobstructed bowel gas pattern. Electronically Signed   By: Logan Bores M.D.   On: 04/05/2017 17:04   US Abdomen Limited Ruq  Result Date: 04/06/2017 CLINICAL DATA:  Nausea and vomiting for 3 days. History of CHF, Crohn's disease, hypertension, diabetes. Previous colectomy,  appendectomy, hernia repair. EXAM: ULTRASOUND ABDOMEN LIMITED RIGHT UPPER QUADRANT COMPARISON:  CT abdomen and pelvis 02/11/2008. Abdominal radiographs 04/05/2017 FINDINGS: Gallbladder: No gallstones or wall thickening visualized. No sonographic Murphy sign noted by sonographer. Common bile duct: Diameter: 4.1 mm, normal Liver: Heterogeneous increased parenchymal echotexture suggesting fatty infiltration of the liver. No focal lesions identified. Limited visualization of the liver due to rib shadowing. IMPRESSION: No evidence of cholelithiasis or cholecystitis. Probable fatty infiltration of the liver.  Electronically Signed   By: Lucienne Capers M.D.   On: 04/06/2017 23:05     Sherene Sires, DO 04/11/2017, 6:54 AM PGY-1, [place] Intern pager: [phone], text pages welcome

## 2017-04-10 NOTE — Progress Notes (Signed)
CARDIAC REHAB PHASE I   PRE:  Rate/Rhythm: 7 SR c/ PVCs  BP:  Sitting: 108/84        SaO2: 96 RA  MODE:  Ambulation: 400 ft   POST:  Rate/Rhythm: 95 SR  BP:  Sitting: 117/89         SaO2: 99 RA  Pt ambulated 400 ft on RA, IV, rolling walker, gait belt, assist x2, slow, mostly steady gait, tolerated well with no complaints. Pt much more alert today, more conversant, more steady. Pt to recliner after walk, call bell within reach. Will follow. Can be x1.   [id number] Lenna Sciara, RN, BSN 04/10/2017 9:46 AM

## 2017-04-10 NOTE — Progress Notes (Signed)
Inpatient Diabetes Program Recommendations  AACE/ADA: New Consensus Statement on Inpatient Glycemic Control (2015)  Target Ranges:  Prepandial:   less than 140 mg/dL      Peak postprandial:   less than 180 mg/dL (1-2 hours)      Critically ill patients:  140 - 180 mg/dL   Results for ISRRAEL, FLUCKIGER (MRN [id number]) as of 04/10/2017 11:58  Ref. Range 04/09/2017 07:51 04/09/2017 13:09 04/09/2017 18:11 04/09/2017 21:27  Glucose-Capillary Latest Ref Range: 65 - 99 mg/dL 184 (H) 195 (H) 176 (H) 197 (H)   Results for EDELMIRO, INNOCENT (MRN [id number]) as of 04/10/2017 11:58  Ref. Range 04/10/2017 07:57  Glucose-Capillary Latest Ref Range: 65 - 99 mg/dL 255 (H)    Home DM Meds: Lantus 37 units daily  Current Insulin Orders: Novolog Sensitive Correction Scale/ SSI (0-9 units) TID AC       MD- Note fasting glucose elevated to 255 mg/dl this AM.  Please consider restarting a portion of pt's home dose of Lantus.  Could start with Lantus 18 units daily (50% total home dose) and titrate as needed.     --Will follow patient during hospitalization--  Wyn Quaker RN, MSN, CDE Diabetes Coordinator Inpatient Glycemic Control Team Team Pager: [phone] (8a-5p)

## 2017-04-10 NOTE — Progress Notes (Addendum)
Physical Therapy Treatment Patient Details Name: [name] MRN: [id number] DOB: 08/10/1966 Today's Date: 04/10/2017    History of Present Illness [name] is a 51 y.o. male presenting with chest pain, nausea/vomiting x 3 days. PMH is significant for CHF (EF25-30%), NSTEMI, CKD stage 3, Chrohns, DVTs, colectomy/appendectomy/hernia repair, neoplasm of colon, hypercholesterolemia    PT Comments    Patient progressing well towards PT goals. Tolerated gait training with use of RW for support. No dyspnea reported or noted. VSS throughout. Pt reports slow gait speed since heart attack and fall 3 months ago. Motivated to be able to negotiate stairs in the future. Demonstrates stability with use of RW. Will most likely benefit from using RW at home. Pt fatigues quickly and continues to report weakness BLEs. Will follow.   Follow Up Recommendations  Home health PT;Supervision - Intermittent     Equipment Recommendations  Rolling walker with 5" wheels    Recommendations for Other Services       Precautions / Restrictions Precautions Precautions: Fall Restrictions Weight Bearing Restrictions: No    Mobility  Bed Mobility               General bed mobility comments: Up in chair upon PT arrival.   Transfers Overall transfer level: Needs assistance Equipment used: Rolling walker (2 wheeled) Transfers: Sit to/from Stand Sit to Stand: Supervision         General transfer comment: Supervision for safety. Cues for hand placement as pt wanting to pull up on RW. SLow to stand.   Ambulation/Gait Ambulation/Gait assistance: Min guard Ambulation Distance (Feet): 150 Feet Assistive device: Rolling walker (2 wheeled) Gait Pattern/deviations: Step-through pattern;Decreased stride length;Trunk flexed Gait velocity: very slow Gait velocity interpretation: Below normal speed for age/gender General Gait Details: very slow, mostly steady gait. HR ranged from 80-90s bpm.  Sp02 >96% on RA. Reports he walks slow at baseline since his heart attack per report.   Stairs            Wheelchair Mobility    Modified Rankin (Stroke Patients Only)       Balance Overall balance assessment: Needs assistance Sitting-balance support: Feet supported;No upper extremity supported Sitting balance-[name]: Good     Standing balance support: During functional activity Standing balance-[name]: Fair Standing balance comment: Able to stand unsupported without difficulty but requires UE support for stability during dynamic tasks.                            Cognition Arousal/Alertness: Awake/alert Behavior During Therapy: Flat affect Overall Cognitive Status: No family/caregiver present to determine baseline cognitive functioning                                 General Comments: Appropriate for conversation and basic tasks.       Exercises      General Comments        Pertinent Vitals/Pain Pain Assessment: No/denies pain    Home Living                      Prior Function            PT Goals (current goals can now be found in the care plan section) Progress towards PT goals: Progressing toward goals    Frequency    Min 3X/week      PT Plan Current  plan remains appropriate    Co-evaluation              AM-PAC PT "6 Clicks" Daily Activity  Outcome Measure  Difficulty turning over in bed (including adjusting bedclothes, sheets and blankets)?: None Difficulty moving from lying on back to sitting on the side of the bed? : None Difficulty sitting down on and standing up from a chair with arms (e.g., wheelchair, bedside commode, etc,.)?: None Help needed moving to and from a bed to chair (including a wheelchair)?: A Little Help needed walking in hospital room?: A Little Help needed climbing 3-5 steps with a railing? : A Lot 6 Click Score: 20    End of Session Equipment Utilized During  Treatment: Gait belt Activity Tolerance: Patient tolerated treatment well Patient left: in chair;with call bell/phone within reach Nurse Communication: Mobility status PT Visit Diagnosis: Unsteadiness on feet (R26.81);Muscle weakness (generalized) (M62.81)     Time: 6349-4944 PT Time Calculation (min) (ACUTE ONLY): 19 min  Charges:  $Gait Training: 8-22 mins                    G Codes:       Wray Kearns, PT, DPT [place] 04/10/2017, 12:03 PM

## 2017-04-10 NOTE — Discharge Summary (Signed)
[facility] Discharge Summary  Patient name: [name] Medical record number: [id number] Date of birth: Dec 12, 1965 Age: 51 y.o. Gender: male Date of Admission: 04/05/2017  Date of Discharge: 04/13/17 Admitting Physician: Everrett Coombe, MD  Primary Care Provider: [name], MD Consultants: Cardio/PT/Palliative  Indication for Hospitalization: intractable vomiting  Discharge Diagnoses/Problem List:  CHF, gastroparesis, vomiting  Disposition: home  Discharge Condition: stable  Discharge Exam: General: looking well, talking on phone during exam, alert and cheerful Cardiovascular: RRR, systolic murmur, no pitting edema on exam Respiratory: Lungs sounded clear bilaterally, no wheezes/rhonchi Abdomen: soft to palpation with no tenderness Extremities: no edema  Brief Hospital Course:  Patient presented with 2 days intractable vomiting and significant abdominal pain along with shortness of breath and diarrhea.  He was admitted and found to also be in mild DKA.   Echo showed CHF with reduced ejection fraction to 20-25% and he was placed on the cardiac floor with a PIC line to measure CVP.   Gastroparesis was determined to be secondary to CHF and resolved with cardiac symptoms improving after medication optimization by cardiology.  Cardiology and Palliative care both met with the patient to discuss their treatment goals and he is being evaluated for an LVAD.  Issues for Follow Up:  1. Consider empagiflozin per cardio for glycemic control 2. Cardio was discussing potential LVAD 3. Please help coordinate walker if it is not handled inpatient. Call [phone] to approve rolling walker   Significant Procedures: PIC line  Significant Labs and Imaging:   Recent Labs Lab 04/11/17 0356 04/12/17 0421 04/13/17 0400  WBC 17.4* 8.2 7.1  HGB 12.3* 10.9* 10.9*  HCT 37.1* 33.4* 32.8*  PLT 227 251 257    Recent Labs Lab 04/10/17 0451  04/11/17 0356 04/12/17 0421 04/13/17 0400  NA 136 137 134* 134*  K 3.7 4.1 3.7 3.6  CL 100* 101 100* 100*  CO2 29 26 25 25   GLUCOSE 243* 157* 150* 184*  BUN 30* 37* 41* 44*  CREATININE 1.79* 2.00* 2.14* 2.11*  CALCIUM 8.1* 8.7* 8.2* 8.3*    Dg Chest 2 View  Result Date: 04/05/2017 CLINICAL DATA:  Four days of sharp sub sternal non radiating chest discomfort. Two days of nausea and vomiting with inability to take medications. History of diabetes, hypertension, ischemic cardiomyopathy with dysrhythmia is, previous MI, nonsmoker. EXAM: CHEST  2 VIEW COMPARISON:  PA and lateral chest x-ray of February 11, 2017 FINDINGS: The lungs are mildly hypoinflated. There is a new small right pleural effusion as well as trace left pleural effusion. The cardiac silhouette remains enlarged. The pulmonary vascularity is more engorged and there is cephalization of the vascular pattern. The ICD is in stable position. The bony thorax is unremarkable. The gas pattern in the upper abdomen is nonspecific. IMPRESSION: CHF with pulmonary vascular congestion and new small right and trace left pleural effusions. The appearance of the chest has deteriorated since the previous study. Electronically Signed   By: David  Martinique M.D.   On: 04/05/2017 12:15   Dg Abd 2 Views  Result Date: 04/05/2017 CLINICAL DATA:  Nausea and vomiting. EXAM: ABDOMEN - 2 VIEW COMPARISON:  11/17/2011 abdominal radiograph. 02/11/2008 CT abdomen and pelvis. FINDINGS: There is no evidence of intraperitoneal free air. Scattered colonic gas is present (including in sigmoid colon which courses into the right lower quadrant). No dilated loops of bowel are seen to suggest obstruction. An ICD is partially visualized. Lung findings, including a small right pleural effusion,  are more fully evaluated on today's earlier chest radiographs. IMPRESSION: Nonobstructed bowel gas pattern. Electronically Signed   By: Logan Bores M.D.   On: 04/05/2017 17:04   US Abdomen  Limited Ruq  Result Date: 04/06/2017 CLINICAL DATA:  Nausea and vomiting for 3 days. History of CHF, Crohn's disease, hypertension, diabetes. Previous colectomy, appendectomy, hernia repair. EXAM: ULTRASOUND ABDOMEN LIMITED RIGHT UPPER QUADRANT COMPARISON:  CT abdomen and pelvis 02/11/2008. Abdominal radiographs 04/05/2017 FINDINGS: Gallbladder: No gallstones or wall thickening visualized. No sonographic Murphy sign noted by sonographer. Common bile duct: Diameter: 4.1 mm, normal Liver: Heterogeneous increased parenchymal echotexture suggesting fatty infiltration of the liver. No focal lesions identified. Limited visualization of the liver due to rib shadowing. IMPRESSION: No evidence of cholelithiasis or cholecystitis. Probable fatty infiltration of the liver. Electronically Signed   By: Lucienne Capers M.D.   On: 04/06/2017 23:05    Results/Tests Pending at Time of Discharge: Gastro panel  Discharge Medications:  Allergies as of 04/13/2017      Reactions   Metformin And Related Diarrhea      Medication List    STOP taking these medications   hydrALAZINE 25 MG tablet Commonly known as:  APRESOLINE   isosorbide mononitrate 60 MG 24 hr tablet Commonly known as:  IMDUR     TAKE these medications   atorvastatin 80 MG tablet Commonly known as:  LIPITOR Take 1 tablet (80 mg total) by mouth daily at 6 PM.   carvedilol 6.25 MG tablet Commonly known as:  COREG Take 0.5 tablets (3.125 mg total) by mouth 2 (two) times daily.   clopidogrel 75 MG tablet Commonly known as:  PLAVIX Take 1 tablet (75 mg total) by mouth daily.   digoxin 0.125 MG tablet Commonly known as:  LANOXIN Take 1 tablet (0.125 mg total) by mouth daily.   gabapentin 300 MG capsule Commonly known as:  NEURONTIN Take 2 capsules (600 mg total) by mouth 2 (two) times daily. What changed:  how much to take  when to take this   insulin glargine 100 UNIT/ML injection Commonly known as:  LANTUS Inject 0.02 mLs (2 Units  total) into the skin daily. What changed:  how much to take   isosorbide-hydrALAZINE 20-37.5 MG tablet Commonly known as:  BIDIL Take 1 tablet by mouth 3 (three) times daily.   metoCLOPramide 5 MG tablet Commonly known as:  REGLAN Take one tablet 1/2 hr before meals. What changed:  how much to take  how to take this  when to take this  reasons to take this  additional instructions   nitroGLYCERIN 0.4 MG SL tablet Commonly known as:  NITROSTAT Place 1 tablet (0.4 mg total) under the tongue every 5 (five) minutes as needed for chest pain.   ondansetron 4 MG disintegrating tablet Commonly known as:  ZOFRAN ODT Take 1 tablet (4 mg total) by mouth every 6 (six) hours as needed for nausea or vomiting.   pantoprazole 40 MG tablet Commonly known as:  PROTONIX Take 40 mg by mouth daily.   ranolazine 500 MG 12 hr tablet Commonly known as:  RANEXA Take 1 tablet (500 mg total) by mouth 2 (two) times daily.   spironolactone 25 MG tablet Commonly known as:  ALDACTONE Take 0.5 tablets (12.5 mg total) by mouth daily.   torsemide 20 MG tablet Commonly known as:  DEMADEX Take 2 tablets (40 mg total) by mouth every evening. What changed:  how much to take  how to take this  when to  take this  additional instructions   torsemide 20 MG tablet Commonly known as:  DEMADEX Take 4 tablets (80 mg total) by mouth daily with breakfast. What changed:  You were already taking a medication with the same name, and this prescription was added. Make sure you understand how and when to take each.   warfarin 5 MG tablet Commonly known as:  COUMADIN Take as directed by coumadin clinic What changed:  how much to take  how to take this  when to take this  additional instructions       Discharge Instructions: Please refer to Patient Instructions section of EMR for full details.  Patient was counseled important signs and symptoms that should prompt return to medical care, changes  in medications, dietary instructions, activity restrictions, and follow up appointments.   Follow-Up Appointments: Follow-up Information    Larey Dresser, MD Follow up on 04/25/2017.   Specialty:  Cardiology Why:  at 10:20 am for hospital follow up. Garage code [id number].  Contact information: [place] [address] [place] Follow up on 04/19/2017.   Specialty:  Cardiology Why:  at 1100 am for BMET. Can stop by anytime before or after Cardiac Rehab.  Code for parking is [phone]. Contact information: [address] [id number] [place] [phone]       [place] Follow up on 04/17/2017.   Specialty:  Cardiology Why:  for INR check at 9:00 am  Contact information: [address], [address] [place]       [name], MD. Go on 04/18/2017.   Specialty:  Family Medicine Why:  Please arrive 15 minutes prior to your 1:50 appointment. Contact information: [place] [address] [phone]           Sherene Sires, DO 04/16/2017, 4:23 PM PGY-1, [place]

## 2017-04-10 NOTE — Evaluation (Signed)
Occupational Therapy Evaluation Patient Details Name: [name] MRN: [id number] DOB: March 17, 1966 Today's Date: 04/10/2017    History of Present Illness [name] is a 51 y.o. male presenting with chest pain, nausea/vomiting x 3 days. PMH is significant for CHF (EF25-30%), NSTEMI, CKD stage 3, Chrohns, DVTs, colectomy/appendectomy/hernia repair, neoplasm of colon, hypercholesterolemia   Clinical Impression   Pt had occasional assistance with LB dressing, but was otherwise independent in ADL prior to admission. 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 Pt presents with fatigue, generalized weakness and some mild impaired standing balance. Will follow acutely.     Follow Up Recommendations  No OT follow up    Equipment Recommendations  None recommended by OT    Recommendations for Other Services       Precautions / Restrictions Precautions Precautions: Fall Restrictions Weight Bearing Restrictions: No      Mobility Bed Mobility               General bed mobility comments: pt in chair upon arrival  Transfers Overall transfer level: Needs assistance Equipment used: None Transfers: Sit to/from Stand Sit to Stand: Supervision         General transfer comment: supervision for safety, assist for lines    Balance Overall balance assessment: Needs assistance   Sitting balance-[name]: Good       Standing balance-[name]: Fair Standing balance comment: walking in room without RW                           ADL either performed or assessed with clinical judgement   ADL Overall ADL's : Needs assistance/impaired Eating/Feeding: Independent;Sitting   Grooming: Wash/dry hands;Standing;Min guard   Upper Body Bathing: Set up;Sitting   Lower Body Bathing: Minimal assistance;Sit to/from stand   Upper Body Dressing : Set up;Sitting   Lower Body Dressing: Minimal assistance;Sit to/from stand   Toilet  Transfer: Min guard;Ambulation;Regular Toilet   Toileting- Water quality scientist and Hygiene: Supervision/safety;Sit to/from stand       Functional mobility during ADLs: Min guard General ADL Comments: fatigued after toileting     Vision Baseline Vision/History: Wears glasses Wears Glasses: At all times Patient Visual Report: No change from baseline       Perception     Praxis      Pertinent Vitals/Pain Pain Assessment: No/denies pain     Hand Dominance Right   Extremity/Trunk Assessment Upper Extremity Assessment Upper Extremity Assessment: Overall WFL for tasks assessed   Lower Extremity Assessment Lower Extremity Assessment: Defer to PT evaluation   Cervical / Trunk Assessment Cervical / Trunk Assessment: Normal   Communication Communication Communication: No difficulties   Cognition Arousal/Alertness: Awake/alert Behavior During Therapy: Flat affect Overall Cognitive Status: Within Functional Limits for tasks assessed                                     General Comments       Exercises     Shoulder Instructions      Home Living Family/patient expects to be discharged to:: Private residence Living Arrangements: Children Available Help at Discharge: Family;Available 24 hours/day Type of Home: House Home Access: Stairs to enter [employer] of Steps: 2   Home Layout: Multi-level;Able to live on main level with bedroom/bathroom Alternate Level Stairs-Number of Steps: flight   Bathroom Shower/Tub: Tub/shower unit  Bathroom Toilet: Standard     Home Equipment: None   Additional Comments: pt lives with son and daughter, one of which is with him all the time. 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        OT Problem List: Decreased activity tolerance;Cardiopulmonary status limiting activity      OT Treatment/Interventions: Self-care/ADL training;Energy conservation;DME and/or AE instruction;Patient/family education    OT Goals(Current goals can be found in the care plan section) Acute Rehab OT Goals Patient Stated Goal: return home OT Goal Formulation: With patient Time For Goal Achievement: 04/24/17 Potential to Achieve Goals: Good ADL Goals Pt Will Perform Grooming: with modified independence;standing (3 activities) Pt Will Perform Lower Body Dressing: with modified independence;sit to/from stand Pt Will Transfer to Toilet: with modified independence;ambulating;regular height toilet Pt Will Perform Tub/Shower Transfer: with modified independence;Tub transfer;ambulating (educate in benefits of shower seat) Additional ADL Goal #1: Pt will utilize energy conservation strategies during ADL and IADL with min verbal cues. Additional ADL Goal #2: Pt will gather ADL items from around room modified independently.  OT Frequency: Min 2X/week   Barriers to D/C:            Co-evaluation              AM-PAC PT "6 Clicks" Daily Activity     Outcome Measure Help from another person eating meals?: None Help from another person taking care of personal grooming?: A Little Help from another person toileting, which includes using toliet, bedpan, or urinal?: A Little Help from another person bathing (including washing, rinsing, drying)?: A Little Help from another person to put on and taking off regular upper body clothing?: None Help from another person to put on and taking off regular  lower body clothing?: A Little 6 Click Score: 20   End of Session Nurse Communication: Other (comment) (ok to give diet ginger ale)  Activity Tolerance: Patient limited by fatigue Patient left: in chair;with call bell/phone within reach  OT Visit Diagnosis: Unsteadiness on feet (R26.81)                Time: 8264-1583 OT Time Calculation (min): 23 min Charges:  OT General Charges $OT Visit: 1 Procedure OT Evaluation $OT Eval Moderate Complexity: 1 Procedure OT Treatments $Self Care/Home Management : 8-22 mins G-Codes:       Malka So 04/10/2017, 4:46 PM  [phone]

## 2017-04-10 NOTE — Progress Notes (Signed)
ANTICOAGULATION CONSULT NOTE - Follow Up Consult  Pharmacy Consult for Enox + Warfarin Indication: recurrent DVT  Allergies  Allergen Reactions  . Metformin And Related Diarrhea    Patient Measurements: Height: 5' 7"  (170.2 cm) Weight: 191 lb 12.8 oz (87 kg) IBW/kg (Calculated) : 66.1  Vital Signs: Temp: 97.8 F (36.6 C) (08/06 0352) Temp Source: Axillary (08/06 0352) BP: 116/59 (08/06 0352) Pulse Rate: 84 (08/06 0300)  Labs:  Recent Labs  04/08/17 0445 04/09/17 0355 04/10/17 0451  HGB 10.3* 10.3* 10.2*  HCT 31.8* 32.5* 31.6*  PLT 224 243 226  LABPROT 15.6* 14.7 16.7*  INR 1.23 1.14 1.34  CREATININE 2.10* 1.96* 1.79*    Estimated Creatinine Clearance: 50.9 mL/min (A) (by C-G formula based on SCr of 1.79 mg/dL (H)).   Medications:  Home Coumadin dose: 7.78m on Mondays and Saturdays, 56mall other days  Assessment: [age]ear old male admitted with CHF exacerbation. He is on chronic anticoagulation for recurrent DVTs with warfarin. His warfarin was held on admission and he is currently being anticoagulated and bridged with Lovenox. Warfarin was restarted 8/3.   INR today remains SUBtherapeutic at 1.34 < 1.14. CBC stable with no reported bleeding.   Goal of Therapy:  INR 2-3 Anti-Xa level 0.6-1 units/ml 4hrs after LMWH dose given Monitor platelets by anticoagulation protocol: Yes   Plan:  1. Repeat Warfarin 10 mg x 1 dose at 1800  2. Continue Lovenox 1.5 mg/kg (~130 mg) SQ daily for bridging 3. Will continue to monitor for any signs/symptoms of bleeding and will follow up with PT/INR in the a.m.  Thank you for allowing pharmacy to be a part of this patient's care.  AnVincenza HewsPharmD, BCPS 04/10/2017, 7:58 AM

## 2017-04-10 NOTE — Progress Notes (Signed)
Patient ID: [name], male   DOB: 02-12-1966, 51 y.o.   MRN: [id number]     Advanced Heart Failure Rounding Note  Primary Cardiologist: Aundra Dubin  Subjective:    CVP 7 today, co ox stable at 72%. Weights labile. Feels well today, denies orthopnea, SOB.   Abdominal US: No gallstones/cholecystitis, fatty liver.   Echo: EF 20-25%, diffuse hypokinesis, mild MR.   Objective:   Weight Range: 191 lb 12.8 oz (87 kg) Body mass index is 30.04 kg/m.   Vital Signs:   Temp:  [97.3 F (36.3 C)-97.9 F (36.6 C)] 97.9 F (36.6 C) (08/06 0801) Pulse Rate:  [76-91] 84 (08/06 0300) Resp:  [12-23] 19 (08/06 0352) BP: (100-135)/(59-101) 116/59 (08/06 0352) SpO2:  [92 %-100 %] 100 % (08/06 0300) Weight:  [191 lb 12.8 oz (87 kg)] 191 lb 12.8 oz (87 kg) (08/06 0352) Last BM Date: 04/09/17  Weight change: Filed Weights   04/08/17 0600 04/09/17 0409 04/10/17 0352  Weight: 184 lb 4.8 oz (83.6 kg) 190 lb 9.6 oz (86.5 kg) 191 lb 12.8 oz (87 kg)    Intake/Output:   Intake/Output Summary (Last 24 hours) at 04/10/17 0808 Last data filed at 04/10/17 6063  Gross per 24 hour  Intake              600 ml  Output             3000 ml  Net            -2400 ml      Physical Exam    General: Well appearing male. No resp difficulty. HEENT: Normal Neck: Supple. JVP 5-6. Carotids 2+ bilat; no bruits. No thyromegaly or nodule noted. Cor: PMI nondisplaced. RRR, No M/G/R noted Lungs: CTAB, normal effort. Abdomen: Soft, non-tender, non-distended, no HSM. No bruits or masses. +BS  Extremities: No cyanosis, clubbing, rash, R and LLE no edema. RUE PICC Neuro: Alert & orientedx3, cranial nerves grossly intact. moves all 4 extremities w/o difficulty. Affect pleasant    Telemetry   NSR - personally reviewed.   EKG    04/05/17 - NSR with nonspecific ST changes. - personally reviewed.   Labs    CBC  Recent Labs  04/09/17 0355 04/10/17 0451  WBC 7.0 5.5  HGB 10.3* 10.2*  HCT 32.5* 31.6*    MCV 81.7 81.4  PLT 243 016   Basic Metabolic Panel  Recent Labs  04/08/17 0445 04/09/17 0355 04/10/17 0451  NA 138 136 136  K 3.7 3.5 3.7  CL 103 98* 100*  CO2 30 29 29   GLUCOSE 247* 177* 243*  BUN 38* 36* 30*  CREATININE 2.10* 1.96* 1.79*  CALCIUM 7.7* 8.0* 8.1*  MG 1.8  --   --   PHOS 4.1  --   --    Liver Function Tests No results for input(s): AST, ALT, ALKPHOS, BILITOT, PROT, ALBUMIN in the last 72 hours. No results for input(s): LIPASE, AMYLASE in the last 72 hours. Cardiac Enzymes No results for input(s): CKTOTAL, CKMB, CKMBINDEX, TROPONINI in the last 72 hours.  BNP: BNP (last 3 results)  Recent Labs  01/17/17 1204 02/08/17 1250 04/05/17 1510  BNP 353.6* 1,330.6* 2,214.8*       Imaging   Transthoracic Echocardiography 04/06/17  Study Conclusions  - Left ventricle: The cavity size was moderately dilated. Wall   thickness was increased in a pattern of mild LVH. There was mild   concentric hypertrophy. Systolic function was severely reduced.   The estimated  ejection fraction was in the range of 20% to 25%.   Diffuse hypokinesis. Doppler parameters are consistent with a   reversible restrictive pattern, indicative of decreased left   ventricular diastolic compliance and/or increased left atrial   pressure (grade 3 diastolic dysfunction). Doppler parameters are   consistent with high ventricular filling pressure. - Aortic valve: There was trivial regurgitation. - Mitral valve: There was mild regurgitation. Valve area by   pressure half-time: 2.12 cm^2. - Left atrium: The atrium was severely dilated. - Right atrium: The atrium was mildly dilated. - Atrial septum: No defect or patent foramen ovale was identified   by color flow Doppler. - Pulmonary arteries: Systolic pressure was within the normal   range. PA peak pressure: 18 mm Hg (S).  Medications:     Scheduled Medications: . atorvastatin  80 mg Oral q1800  . carvedilol  3.125 mg Oral BID  WC  . clopidogrel  75 mg Oral Daily  . digoxin  0.125 mg Oral Daily  . enoxaparin (LOVENOX) injection  1.5 mg/kg Subcutaneous Q24H  . gabapentin  300 mg Oral BID  . insulin aspart  0-9 Units Subcutaneous TID WC  . isosorbide-hydrALAZINE  1 tablet Oral TID  . LORazepam  0.5 mg Intravenous TID  . metoCLOPramide (REGLAN) injection  5 mg Intravenous Q8H  . ranolazine  500 mg Oral BID  . sodium chloride flush  10-40 mL Intracatheter Q12H  . spironolactone  12.5 mg Oral Daily  . torsemide  40 mg Oral QPM  . torsemide  80 mg Oral Q breakfast  . warfarin  10 mg Oral ONCE-1800  . 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   Assessment/Plan   1. Acute on chronic systolic CHF: Ischemic cardiomyopathy. St Jude ICD.  Echo this admission with EF 20-25% (fairly stable). He was admitted with volume overload and dyspnea as well as nausea/vomiting.  - CVP 7, Volume stable on exam. Continue torsemide 80 in the am, 40 mg in the pm.      - Continue Bidil 1 tab tid.  - Continue current Coreg and digoxin.   - No ACEI/ARB/ARNI with elevated creatinine.  - Continue Spiro 12.5 mg daily.  - He is interested in Cardi[facility], will start approval process.  2. CAD: s/p PCI in 3/18 with DES to RCA and mid LAD.  - Denies chest pain - Continue Plavix, not on ASA with warfarin use.  - Continue statin.  3. H/o DVTs (recurrent): None recently.  - On warfarin as above.  4. Diabetes: Presented with DKA, anion gap has now closed.  Per primary service. Empagliflozin  would be good choice in him for diabetes management if insurance will cover.  - No change.  5. Abdominal pain/nausea: Abdominal US with no gallstones.  ?Due to gastroparesis + gut edema with CHF. - Much improved.  6. AKI on CKD Stage 3. - Creatinine stable at 1.79 today.   Can go to telemetry.   Length of Stay: [facility], NP  04/10/2017, 8:08 AM  Advanced Heart Failure Team Pager [phone] (M-F; 7a -  4p)  Please contact [facility] for night-coverage after hours (4p -7a ) and weekends on [email or web address]  Patient seen with NP, agree with the above note.  From a cardiac standpoint, he looks good.  Creatinine down, looks euvolemic.  CVP 7.  Continue current regimen.  I think he would be a good CardioMems candidate.   He is having diarrhea today.  Ongoing GI workup per primary service.   Zachery Niswander 04/10/2017

## 2017-04-10 NOTE — Progress Notes (Signed)
Palliative Medicine consult noted. Due to high referral volume, there may be a delay seeing this patient. Please call the Palliative Medicine Team office at [phone] if recommendations are needed in the interim.  Thank you for inviting [place] to see this patient.  Marjie Skiff Cara Thaxton, RN, BSN, [facility] 04/10/2017 9:36 AM Cell [phone] 8:00-4:00 Monday-Friday Office [phone]

## 2017-04-10 NOTE — Progress Notes (Signed)
Family Medicine Teaching Service Daily Progress Note Intern Pager: [phone]  Patient name: [name] Medical record number: [id number] Date of birth: 03/05/1966 Age: 51 y.o. Gender: male  Primary Care Provider: [name], MD Consultants: cardio Code Status: full  Overview and major events to date [name] a 51 y.o.malepresenting with chest pain, nausea/vomiting x 3 days. PMH is significant for CHF (EF25-30%), NSTEMI, CKD stage 3, Chrohns, DVTs, colectomy/appendectomy/hernia repair, neoplasm of colon, hypercholesterolemia. Significant events include PIC line 8/2 for CVP pressures.  Assessment and Plan: [name] a 51 y.o.malepresenting with chest pain, nausea/vomiting x 3 days likely caused by CHF exacerbation. PMH is significant for CHF (EF25-30%), NSTEMI, CKD stage 3, Chrohns, DVTs, colectomy/appendectomy/hernia repair, neoplasm of colon, hypercholesterolemia  Chest Pain/CHF Exacerbation,: Admission BNP 2214.8, Digoxin level <0.2.Troponin negative 2. Chest x-ray showing signs of pulmonary vascular congestion with small right pleural effusion and trace left pleural effusion. Patient likely having CHF exacerbation again versus ACS event. HEART Score of 5.  -34M followed by cardio -EF now 20-25% (8/2 echo) -Vital signs per floor protocol, Continuous cardiac monitoring, Pulse oximetry  -CVP via PIC line per cardio -per cardiology, possible candidate for LVAD -Strict I's and O's, Daily weights - Daily EKG - Continue home Reglan  -d/c lasix per Cardiology -IMDUR 26m daily -prn nitro 416m-renazoline 50067mD -palliative consult appreciated -Bidil TID transferring to telemetry  Nausea, Vomiting: Likely due to CHF gastroperesis vs viral gastro enteritis/DKA related edema -mostly resolved. -phenrgan or zophran prn  T2DM- takes 37 units of Lantus daily @home . Last hemoglobin A1c in 02/09/2017; 7.5, glucosuria >500 -Lantus 2  (8/3) -SSI sensitive -will address as labs come in with lantus -cardio suggests considering empagiflozin  Diabetic Neuropathy -currently getting home dosage gabapentin 300 mg POQD at night -increase to 600m39mD gabapentin  CKD stage 3: Baseline creatinine appears to be around 1.32- 2.2 (difficult to determine as he has had variable creatinine this year) however last recorded creatinine prior to this admission was 1.46 in June 2018.  Recent Labs       Lab Results  Component Value Date   CREATININE 1.96 (H) 04/09/2017   CREATININE 2.10 (H) 04/08/2017   CREATININE 1.75 (H) 04/07/2017    -Daily BMP -per cards, avoid nephrotoxic agents  Hypertension:Normotensive -Continue home Coreg, Imdur, digoxin per cardio -Holding home torsemide -per cardiology, hold lasix and ACE/ARB/ARNI w/ elevated Cr  Significant GIhistory: History of Crohn's disease, colectomy, diverticulosis, rectal polyp, and gastroparesis (secondary to diabetes). Hx of chronic nausea and vomiting.  -Continue home Reglan -Phenergan prn -Abdominal imaging as above  History of recurrent DVTs: On Coumadin at home -Coumadin per pharmacy bridging from lovenox  Hyperlipidemia:  - Continue home atorvastatin  Hypokalemia- resolved - 3.7  Hypocalcemia- psuedohypocalcemia - corrected is 8.3  FEN/GI: renal/carb diet Prophylaxis: coumadin per pharmacy  Disposition:home pending Cardiology/FM clearance    Subjective:  Patient said they had no nausea or vomiting overnight and have been able to keep down food.   Their only complaint increased pain in their feet consistent with diabetic neuropathy.  Objective: Temp:  [97.3 F (36.3 C)-97.8 F (36.6 C)] 97.8 F (36.6 C) (08/06 0352) Pulse Rate:  [76-91] 84 (08/06 0300) Resp:  [12-23] 19 (08/06 0352) BP: (100-135)/(59-101) 116/59 (08/06 0352) SpO2:  [92 %-100 %] 100 % (08/06 0300) Weight:  [191 lb 12.8 oz (87 kg)] 191 lb 12.8 oz (87 kg) (08/06  0352) Physical Exam: General: awake, NAD Cardiovascular: RRR, systolic murmur, Respiratory: normal work  of breathing, no obvious rhonchi Gastrointestinal: soft belly to exam,no tenderness expressed to palpation MSK: no pitting edema on lower extremities bilaterally, balls of his feet are tender to light sensation  Laboratory:  Recent Labs Lab 04/08/17 0445 04/09/17 0355 04/10/17 0451  WBC 7.0 7.0 5.5  HGB 10.3* 10.3* 10.2*  HCT 31.8* 32.5* 31.6*  PLT 224 243 226    Recent Labs Lab 04/05/17 2235  04/08/17 0445 04/09/17 0355 04/10/17 0451  NA  --   < > 138 136 136  K  --   < > 3.7 3.5 3.7  CL  --   < > 103 98* 100*  CO2  --   < > 30 29 29   BUN  --   < > 38* 36* 30*  CREATININE  --   < > 2.10* 1.96* 1.79*  CALCIUM  --   < > 7.7* 8.0* 8.1*  PROT 7.4  --   --   --   --   BILITOT 1.5*  --   --   --   --   ALKPHOS 130*  --   --   --   --   ALT 19  --   --   --   --   AST 25  --   --   --   --   GLUCOSE  --   < > 247* 177* 243*  < > = values in this interval not displayed.    Imaging/Diagnostic Tests: Dg Chest 2 View  Result Date: 04/05/2017 CLINICAL DATA:  Four days of sharp sub sternal non radiating chest discomfort. Two days of nausea and vomiting with inability to take medications. History of diabetes, hypertension, ischemic cardiomyopathy with dysrhythmia is, previous MI, nonsmoker. EXAM: CHEST  2 VIEW COMPARISON:  PA and lateral chest x-ray of February 11, 2017 FINDINGS: The lungs are mildly hypoinflated. There is a new small right pleural effusion as well as trace left pleural effusion. The cardiac silhouette remains enlarged. The pulmonary vascularity is more engorged and there is cephalization of the vascular pattern. The ICD is in stable position. The bony thorax is unremarkable. The gas pattern in the upper abdomen is nonspecific. IMPRESSION: CHF with pulmonary vascular congestion and new small right and trace left pleural effusions. The appearance of the chest has  deteriorated since the previous study. Electronically Signed   By: David  Martinique M.D.   On: 04/05/2017 12:15   Dg Abd 2 Views  Result Date: 04/05/2017 CLINICAL DATA:  Nausea and vomiting. EXAM: ABDOMEN - 2 VIEW COMPARISON:  11/17/2011 abdominal radiograph. 02/11/2008 CT abdomen and pelvis. FINDINGS: There is no evidence of intraperitoneal free air. Scattered colonic gas is present (including in sigmoid colon which courses into the right lower quadrant). No dilated loops of bowel are seen to suggest obstruction. An ICD is partially visualized. Lung findings, including a small right pleural effusion, are more fully evaluated on today's earlier chest radiographs. IMPRESSION: Nonobstructed bowel gas pattern. Electronically Signed   By: Logan Bores M.D.   On: 04/05/2017 17:04   US Abdomen Limited Ruq  Result Date: 04/06/2017 CLINICAL DATA:  Nausea and vomiting for 3 days. History of CHF, Crohn's disease, hypertension, diabetes. Previous colectomy, appendectomy, hernia repair. EXAM: ULTRASOUND ABDOMEN LIMITED RIGHT UPPER QUADRANT COMPARISON:  CT abdomen and pelvis 02/11/2008. Abdominal radiographs 04/05/2017 FINDINGS: Gallbladder: No gallstones or wall thickening visualized. No sonographic Murphy sign noted by sonographer. Common bile duct: Diameter: 4.1 mm, normal Liver: Heterogeneous increased parenchymal echotexture suggesting  fatty infiltration of the liver. No focal lesions identified. Limited visualization of the liver due to rib shadowing. IMPRESSION: No evidence of cholelithiasis or cholecystitis. Probable fatty infiltration of the liver. Electronically Signed   By: Lucienne Capers M.D.   On: 04/06/2017 23:05     Sherene Sires, DO 04/10/2017, 6:17 AM PGY-1, [place] Intern pager: [phone], text pages welcome

## 2017-04-10 NOTE — Progress Notes (Signed)
Benefit Check for [facility] services and DME.   Home Health benefits will be 20% coinsurance DME benefit will be 20% coinsurance as well. Prior [name] is required for both [phone]

## 2017-04-10 NOTE — Progress Notes (Signed)
Family Medicine Social Note  Spoke with patient over the phone. Let him know I was aware of his hospital admission and was following along with his progress. Let him know I look forward to seeing him back in the clinic once he has been discharged. I appreciate the excellent care provided by the inpatient team.   [name], MD 04/10/2017, 6:43 PM PGY-3, [place]

## 2017-04-10 NOTE — Plan of Care (Signed)
Problem: Safety: Goal: Ability to remain free from injury will improve Outcome: Progressing Patient safely ambulating with stand-by assistance and a walker  Problem: Health Behavior/Discharge Planning: Goal: Ability to manage health-related needs will improve Outcome: Completed/Met Date Met: 04/10/17 Patient is compliant with prescribed medication regimen.

## 2017-04-11 DIAGNOSIS — Z515 Encounter for palliative care: Secondary | ICD-10-CM

## 2017-04-11 DIAGNOSIS — R197 Diarrhea, unspecified: Secondary | ICD-10-CM

## 2017-04-11 LAB — CBC
HEMATOCRIT: 37.1 % — AB (ref 39.0–52.0)
HEMOGLOBIN: 12.3 g/dL — AB (ref 13.0–17.0)
MCH: 27.1 pg (ref 26.0–34.0)
MCHC: 33.2 g/dL (ref 30.0–36.0)
MCV: 81.7 fL (ref 78.0–100.0)
Platelets: 227 10*3/uL (ref 150–400)
RBC: 4.54 MIL/uL (ref 4.22–5.81)
RDW: 15.2 % (ref 11.5–15.5)
WBC: 17.4 10*3/uL — AB (ref 4.0–10.5)

## 2017-04-11 LAB — BASIC METABOLIC PANEL
ANION GAP: 10 (ref 5–15)
BUN: 37 mg/dL — AB (ref 6–20)
CHLORIDE: 101 mmol/L (ref 101–111)
CO2: 26 mmol/L (ref 22–32)
Calcium: 8.7 mg/dL — ABNORMAL LOW (ref 8.9–10.3)
Creatinine, Ser: 2 mg/dL — ABNORMAL HIGH (ref 0.61–1.24)
GFR calc Af Amer: 42 mL/min — ABNORMAL LOW (ref >60)
GFR, EST NON AFRICAN AMERICAN: 37 mL/min — AB (ref >60)
GLUCOSE: 157 mg/dL — AB (ref 65–99)
POTASSIUM: 4.1 mmol/L (ref 3.5–5.1)
Sodium: 137 mmol/L (ref 135–145)

## 2017-04-11 LAB — COOXEMETRY PANEL
CARBOXYHEMOGLOBIN: 1.6 % — AB (ref 0.5–1.5)
METHEMOGLOBIN: 1.2 % (ref 0.0–1.5)
O2 Saturation: 75.2 %
Total hemoglobin: 12.2 g/dL (ref 12.0–16.0)

## 2017-04-11 LAB — GLUCOSE, CAPILLARY
GLUCOSE-CAPILLARY: 148 mg/dL — AB (ref 65–99)
GLUCOSE-CAPILLARY: 214 mg/dL — AB (ref 65–99)
GLUCOSE-CAPILLARY: 160 mg/dL — AB (ref 65–99)
Glucose-Capillary: 139 mg/dL — ABNORMAL HIGH (ref 65–99)

## 2017-04-11 LAB — PROTIME-INR
INR: 1.65
Prothrombin Time: 19.7 seconds — ABNORMAL HIGH (ref 11.4–15.2)

## 2017-04-11 MED ORDER — LORAZEPAM 0.5 MG PO TABS: 0.5000 mg | ORAL_TABLET | Freq: Three times a day (TID) | ORAL | Status: DC | PRN

## 2017-04-11 MED ORDER — TORSEMIDE 20 MG PO TABS
40.0000 mg | ORAL_TABLET | Freq: Every evening | ORAL | Status: DC
  Administered 2017-04-12: 40 mg via ORAL
  Filled 2017-04-11: qty 2

## 2017-04-11 MED ORDER — WARFARIN SODIUM 10 MG PO TABS
10.0000 mg | ORAL_TABLET | Freq: Once | ORAL | Status: AC
  Administered 2017-04-11: 10 mg via ORAL
  Filled 2017-04-11: qty 1

## 2017-04-11 MED ORDER — ENOXAPARIN SODIUM 120 MG/0.8ML ~~LOC~~ SOLN
1.5000 mg/kg | SUBCUTANEOUS | Status: DC
  Administered 2017-04-11: 120 mg via SUBCUTANEOUS
  Filled 2017-04-11: qty 0.8

## 2017-04-11 MED ORDER — METOCLOPRAMIDE HCL 5 MG PO TABS
5.0000 mg | ORAL_TABLET | Freq: Three times a day (TID) | ORAL | Status: DC
  Administered 2017-04-12 – 2017-04-13 (×4): 5 mg via ORAL
  Filled 2017-04-11 (×7): qty 1

## 2017-04-11 MED ORDER — METOCLOPRAMIDE HCL 5 MG/ML IJ SOLN
5.0000 mg | Freq: Three times a day (TID) | INTRAMUSCULAR | Status: DC
  Filled 2017-04-11 (×4): qty 2

## 2017-04-11 MED ORDER — TORSEMIDE 20 MG PO TABS
80.0000 mg | ORAL_TABLET | Freq: Every day | ORAL | Status: DC
  Administered 2017-04-12 – 2017-04-13 (×2): 80 mg via ORAL
  Filled 2017-04-11 (×2): qty 4

## 2017-04-11 NOTE — Consult Note (Signed)
Consultation Note Date: 04/11/2017   Patient Name: [name]  DOB: 1966/03/18  MRN: [id number]  Age / Sex: 51 y.o., male  PCP: [name], MD Referring Physician: Dickie La, MD  Reason for Consultation: Establishing goals of care  HPI/Patient Profile: 51 y.o. 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  He was admitted on 04/05/2017 with nausea and vomiting.  His BNP was elevated, and CXR showed pulmonary vascular congestion.  Consequently he was admitted for CHF exacerbation and N/V. He has been followed by the Heart Failure Team.  There has been discussion about Cardiomems and eventually possible LVAD.  Since admission he has developed diarrhea.   He had recent admissions in March, April, and June of 2018.  Clinical Assessment and Goals of Care:  I have reviewed medical records including EPIC notes, labs and imaging, received report from the attending team, assessed the patient and scheduled an appointment with him and his daughter  to discuss diagnosis prognosis, [place], EOL wishes, disposition and options.  I introduced Palliative Medicine as specialized medical care for people living with serious illness. It focuses on providing relief from the symptoms and stress of a serious illness. The goal is to improve quality of life for both the patient and the family.  We discussed a brief life review of the patient. He was a Freight forwarder at [employer] and lives at home with his two children.  As far as functional and nutritional status he is able to walk with a rolling walker and is eating well.  We will plan to have a full [place] conversation including advanced directives tomorrow morning.   Primary Decision Maker:  PATIENT    SUMMARY OF  RECOMMENDATIONS    PMT meeting at 9:00 am on 8/8 with patient and Dtr [name].  Code Status/Advance Care Planning:  Full   Symptom Management:   Per primary team.  Psycho-social/Spiritual:   Desire for further Chaplaincy support:  Prognosis:   To be determined  Discharge Planning: Home with Home Health      Primary Diagnoses: Present on Admission: . Diabetic keto-acidosis ([place])   I have reviewed the medical record, interviewed the patient and family, and examined the patient. The following aspects are pertinent.  Past Medical History:  Diagnosis Date  . Angina   . ASCVD (arteriosclerotic cardiovascular disease)    , Anterior infarction 2005, LAD diagonal bifurcation intervention 03/2004  . Automatic implantable cardiac defibrillator -[employer]. Jude's       . Benign neoplasm of colon   . CHF (congestive heart failure) ([place])   . Chronic systolic heart failure ([place])   . Coronary artery disease     Widely patent previously placed stents in the left anterior   . Crohn's disease ([place])   . 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 Dyspnea   . Gastroparesis   .  GERD (gastroesophageal reflux disease)   . High cholesterol   . Hyperlipidemia   . Hypersomnolent    Previous diagnosis of narcolepsy  . Hypertension, essential   . Ischemic cardiomyopathy    Ejection fraction 15-20% catheterization 2010  . Type II or unspecified type diabetes mellitus without mention of complication, not stated as uncontrolled   . Unspecified gastritis and gastroduodenitis without mention of hemorrhage    Social History   Social History  . Marital status: Married    Spouse name: N/A  . Number of children: N/A  . Years of education: N/A   Social History Main Topics  . Smoking status: Never Smoker  . Smokeless tobacco: Never Used  . Alcohol use No  . Drug use: No  . Sexual activity: No   Other Topics Concern  . None   Social History Narrative  . None   Family  History  Problem Relation Age of Onset  . Colon polyps Mother   . Diabetes Mother   . Heart attack Father   . Diabetes Father   . Stroke Paternal Grandfather   . Colitis Sister   . Heart disease Brother   . Colitis Sister   . Colon cancer Neg Hx    Scheduled Meds: . atorvastatin  80 mg Oral q1800  . carvedilol  3.125 mg Oral BID WC  . clopidogrel  75 mg Oral Daily  . digoxin  0.125 mg Oral Daily  . enoxaparin (LOVENOX) injection  1.5 mg/kg Subcutaneous Q24H  . gabapentin  600 mg Oral BID  . insulin aspart  0-9 Units Subcutaneous TID WC  . isosorbide-hydrALAZINE  1 tablet Oral TID  . metoCLOPramide  5 mg Oral Q8H   Or  . metoCLOPramide (REGLAN) injection  5 mg Intravenous Q8H  . ranolazine  500 mg Oral BID  . sodium chloride flush  10-40 mL Intracatheter Q12H  . spironolactone  12.5 mg Oral Daily  . [START ON 04/12/2017] torsemide  40 mg Oral QPM  . [START ON 04/12/2017] torsemide  80 mg Oral Q breakfast  . warfarin  10 mg Oral ONCE-1800  . Warfarin - Pharmacist Dosing Inpatient   Does not apply q1800   Continuous Infusions: PRN Meds:.acetaminophen **OR** acetaminophen, LORazepam, nitroGLYCERIN, ondansetron (ZOFRAN) IV, promethazine, sodium chloride flush Allergies  Allergen Reactions  . Metformin And Related Diarrhea   Review of Systems patient denies CP, SOB, dysuria, changes in bowel habits  Physical Exam  Well developed thin male, NAD, A&O, Cooperative CV irreg irreg.  No frank m/r/g Resp  CTA no w/c/r Abdomen soft, thin, nt, nd Extremities trace edema.  Vital Signs: BP 117/78 (BP Location: Left Arm)   Pulse 92   Temp 98.3 F (36.8 C) (Oral)   Resp 18   Ht 5' 8"  (1.727 m)   Wt 79.3 kg (174 lb 14.4 oz) Comment: scale b  SpO2 98%   BMI 26.59 kg/m  Pain Assessment: No/denies pain   Pain Score: 0-No pain   SpO2: SpO2: 98 % O2 Device:SpO2: 98 % O2 Flow Rate: .   IO: Intake/output summary:  Intake/Output Summary (Last 24 hours) at 04/11/17 1236 Last data  filed at 04/11/17 0921  Gross per 24 hour  Intake              480 ml  Output              500 ml  Net              -20  ml    LBM: Last BM Date: 04/11/17 Baseline Weight: Weight: 83.9 kg (185 lb) Most recent weight: Weight: 79.3 kg (174 lb 14.4 oz) (scale b)     Palliative Assessment/Data:   Flowsheet Rows     Most Recent Value  Intake Tab  Unit at Time of Referral  Cardiac/Telemetry Unit  Palliative Care Primary Diagnosis  Cardiac  Date Notified  04/09/17  Palliative Care Type  New Palliative care  Reason for referral  Clarify Goals of Care, Advance Care Planning  Date of Admission  04/05/17  Date first seen by Palliative Care  04/11/17  # of days Palliative referral response time  2 Day(s)  # of days IP prior to Palliative referral  4  Clinical Assessment  Palliative Performance Scale Score  50%  Psychosocial & Spiritual Assessment  Palliative Care Outcomes  Patient/Family meeting held?  Yes  Who was at the meeting?  meeting scheduled with patient and dtr   Palliative Care Outcomes  Clarified goals of care, Provided advance care planning      Time In: 8:30 Time Out: 9:00 Time Total: 30 min. Greater than 50%  of this time was spent counseling and coordinating care related to the above assessment and plan.  Signed by: Florentina Jenny, PA-C Palliative Medicine Pager: [phone]  Please contact Palliative Medicine Team phone at [phone] for questions and concerns.  For individual provider: See Shea Evans

## 2017-04-11 NOTE — Progress Notes (Signed)
ANTICOAGULATION CONSULT NOTE - Follow Up Consult  Pharmacy Consult for Coumadin (and on Lovenox bridge) Indication: hx recurrent DVT  Allergies  Allergen Reactions  . Metformin And Related Diarrhea    Patient Measurements: Height: 5' 8"  (172.7 cm) Weight: 174 lb 14.4 oz (79.3 kg) (scale b) IBW/kg (Calculated) : 68.4 Lovenox Dosing Weight: 79.3 kg  Vital Signs: Temp: 98.8 F (37.1 C) (08/07 0731) Temp Source: Oral (08/07 0731) BP: 110/78 (08/07 0731) Pulse Rate: 93 (08/07 0731)  Labs:  Recent Labs  04/09/17 0355 04/10/17 0451 04/11/17 0356  HGB 10.3* 10.2* 12.3*  HCT 32.5* 31.6* 37.1*  PLT 243 226 227  LABPROT 14.7 16.7* 19.7*  INR 1.14 1.34 1.65  CREATININE 1.96* 1.79* 2.00*    Estimated Creatinine Clearance: 41.8 mL/min (A) (by C-G formula based on SCr of 2 mg/dL (H)).  Assessment:  51 year old male admitted with CHF exacerbation. He is on chronic anticoagulation for recurrent DVTs with warfarin. His warfarin was held on admission and he is currently being anticoagulated and bridged with Lovenox. Coumadin was restarted 8/3.     INR up to 1.65 today.  Has had Coumadin 7.5 mg x 2 days then 10 mg x 2 days.   Weight down to 79.3 kg.  Currently on Lovenox 130 mg sq q24hrs based on weight of 85.9 kg on 8/3.   Also on Plavix daily for hx stent 11/2016.  No bleeding reported.     Home Coumadin regimen: 5 mg daily except 7.5 mg on Mondays and Saturdays.  Goal of Therapy:  INR 2-3 Anti-Xa level 0.6-1 units/ml 4hrs after LMWH dose given Monitor platelets by anticoagulation protocol: Yes   Plan:    Decrease Lovenox from 130 to 120 mg sq q24hrs = 1.5 mg/kg.   Coumadin 10 mg again today.   Daily PT/INR.   Stop Lovenox when INR >2.   Change Reglan from 5 mg IV q8h to 5 mg PO tid prn as at home?  Arty Baumgartner, [place] Pager: [phone] 04/11/2017,11:25 AM

## 2017-04-11 NOTE — Progress Notes (Signed)
No charge note.  PMT meeting scheduled with patient and dtr tomorrow am at 9:00 (8/8).   If patient discharges before then please request outpatient palliative follow up.    Florentina Jenny, PA-C Palliative Medicine Pager: [phone]

## 2017-04-11 NOTE — Progress Notes (Signed)
Patient ID: [name], male   DOB: 1966-05-22, 51 y.o.   MRN: [id number]     Advanced Heart Failure Rounding Note  Primary Cardiologist: Aundra Dubin  Subjective:    Weight down 5 pounds overnight. He says he had diarrhea all night, also with low grade fever.   Abdominal US: No gallstones/cholecystitis, fatty liver.   Echo: EF 20-25%, diffuse hypokinesis, mild MR.   Objective:   Weight Range: 174 lb 14.4 oz (79.3 kg) Body mass index is 26.59 kg/m.   Vital Signs:   Temp:  [98 F (36.7 C)-100.1 F (37.8 C)] 98.8 F (37.1 C) (08/07 0731) Pulse Rate:  [83-106] 93 (08/07 0731) Resp:  [18-21] 18 (08/07 0731) BP: (98-126)/(62-88) 110/78 (08/07 0731) SpO2:  [97 %-100 %] 99 % (08/07 0731) Weight:  [174 lb 14.4 oz (79.3 kg)-179 lb 4.8 oz (81.3 kg)] 174 lb 14.4 oz (79.3 kg) (08/07 0544) Last BM Date: 04/09/17  Weight change: Filed Weights   04/10/17 0352 04/10/17 2113 04/11/17 0544  Weight: 191 lb 12.8 oz (87 kg) 179 lb 4.8 oz (81.3 kg) 174 lb 14.4 oz (79.3 kg)    Intake/Output:   Intake/Output Summary (Last 24 hours) at 04/11/17 0805 Last data filed at 04/10/17 1650  Gross per 24 hour  Intake              480 ml  Output              500 ml  Net              -20 ml      Physical Exam    General: Fatigued appearing male. NAD.  HEENT: Normal Neck: Supple, JVP flat. Carotids 2+ bilat; no bruits. No thyromegaly or nodule noted. Cor: PMI nondisplaced. Regular rate and rhythm. No M/G/R noted Lungs: CTAB, normal effort.  Abdomen: Soft, non-tender, non-distended, no HSM. No bruits or masses. +BS  Extremities: No cyanosis, clubbing, rash, No peripheral edema.  Warm. RUE PICC  Neuro: Alert & orientedx3, cranial nerves grossly intact. moves all 4 extremities w/o difficulty. Affect pleasant    Telemetry   NSR - personally reviewed.   EKG   04/05/17 - NSR with nonspecific ST changes - personally reviewed.   Labs    CBC  Recent Labs  04/10/17 0451 04/11/17 0356    WBC 5.5 17.4*  HGB 10.2* 12.3*  HCT 31.6* 37.1*  MCV 81.4 81.7  PLT 226 211   Basic Metabolic Panel  Recent Labs  04/10/17 0451 04/11/17 0356  NA 136 137  K 3.7 4.1  CL 100* 101  CO2 29 26  GLUCOSE 243* 157*  BUN 30* 37*  CREATININE 1.79* 2.00*  CALCIUM 8.1* 8.7*    BNP: BNP (last 3 results)  Recent Labs  01/17/17 1204 02/08/17 1250 04/05/17 1510  BNP 353.6* 1,330.6* 2,214.8*       Imaging   Transthoracic Echocardiography 04/06/17  Study Conclusions  - Left ventricle: The cavity size was moderately dilated. Wall   thickness was increased in a pattern of mild LVH. There was mild   concentric hypertrophy. Systolic function was severely reduced.   The estimated ejection fraction was in the range of 20% to 25%.   Diffuse hypokinesis. Doppler parameters are consistent with a   reversible restrictive pattern, indicative of decreased left   ventricular diastolic compliance and/or increased left atrial   pressure (grade 3 diastolic dysfunction). Doppler parameters are   consistent with high ventricular filling pressure. - Aortic valve: There  was trivial regurgitation. - Mitral valve: There was mild regurgitation. Valve area by   pressure half-time: 2.12 cm^2. - Left atrium: The atrium was severely dilated. - Right atrium: The atrium was mildly dilated. - Atrial septum: No defect or patent foramen ovale was identified   by color flow Doppler. - Pulmonary arteries: Systolic pressure was within the normal   range. PA peak pressure: 18 mm Hg (S).  Medications:     Scheduled Medications: . atorvastatin  80 mg Oral q1800  . carvedilol  3.125 mg Oral BID WC  . clopidogrel  75 mg Oral Daily  . digoxin  0.125 mg Oral Daily  . enoxaparin (LOVENOX) injection  1.5 mg/kg Subcutaneous Q24H  . gabapentin  600 mg Oral BID  . insulin aspart  0-9 Units Subcutaneous TID WC  . isosorbide-hydrALAZINE  1 tablet Oral TID  . LORazepam  0.5 mg Oral TID  . metoCLOPramide  (REGLAN) injection  5 mg Intravenous Q8H  . ranolazine  500 mg Oral BID  . sodium chloride flush  10-40 mL Intracatheter Q12H  . spironolactone  12.5 mg Oral Daily  . torsemide  40 mg Oral QPM  . torsemide  80 mg Oral Q breakfast  . Warfarin - Pharmacist Dosing Inpatient   Does not apply q1800    Infusions:   PRN Medications: acetaminophen **OR** acetaminophen, nitroGLYCERIN, ondansetron (ZOFRAN) IV, promethazine, sodium chloride flush    Patient Profile   51 yo with history of CAD s/p most recent PCI in 8/85, chronic systolic CHF/ischemic cardiomyopathy, type II diabetes with gastroparesis, recurrent DVTs on warfarin was admitted with acute on chronic systolic CHF, DKA, and abdominal discomfort/nausea.   Assessment/Plan   1. Acute on chronic systolic CHF: Ischemic cardiomyopathy. St Jude ICD.  Echo this admission with EF 20-25% (fairly stable). He was admitted with volume overload and dyspnea as well as nausea/vomiting.  - Appears dry on exam. CVP no longer hooked up, cannot transduce CVP on this floor. Had multiple episodes of diarrhea all night. Suspect that he is volume depleted from this, creatinine 1.79->2.00 this am. Will hold torsemide today.  - Continue Bidil 1 tab TID.  - Continue Coreg and digoxin. Dig level done a week ago and was < 0.2.  - Continue Spiro 12.5 mg daily - No ARB/ARNI with CKD - Will start approval process for CardioMems.   2. CAD: s/p PCI in 3/18 with DES to RCA and mid LAD.  - Denies chest pain - Continue Plavix, not on ASA with warfarin use.  - Continue statin.  - No change to current plan.   3. H/o DVTs (recurrent): None recently.  - On warfarin as above.  - INR is 1.65, on Lovenox as well for bridging.    4. Diabetes: Presented with DKA, anion gap has now closed.  Per primary service. Empagliflozin would be good choice in him for diabetes management if insurance will cover.  - No change to current plan.   5. Abdominal pain/nausea: Abdominal  US with no gallstones.  ?Due to gastroparesis + gut edema with CHF. - Improved, but now with diarrhea. Will check stool for Cdiff, he has a history of Crohn's disease, but with low grade fever it is best to rule out C. Diff.    6. AKI on CKD Stage 3. - Creatinine bumped today to 2.0 as above. Will hold torsemide.    Length of Stay: [place], NP  04/11/2017, 8:05 AM  Advanced Heart Failure Team  Pager [phone] (M-F; [place])  Please contact [facility] for night-coverage after hours (4p -7a ) and weekends on [email or web address]  Patient seen with NP, agree with the above note.  Profuse watery diarrhea yesterday.  Creatinine higher today at 2 and not volume overloaded.  Diarrhea seems to have mostly resolved today.  C difficile to be sent.  - Would hold torsemide today.  - If no further diarrhea, can likely resume tomorrow and probably home tomorrow.   Chace Klippel 04/11/2017 1:46 PM

## 2017-04-11 NOTE — Discharge Instructions (Addendum)
You were admitted to the hospital for a heart failure exacerbation, nausea, vomiting, and abdominal pain. We gave you medications to help with this. Cardiology adjusted your medications (please see your new medication list above) to help your heart and take fluid off your body. You are now safe to return to home.   We have also decreased your home insulin dose.   Please follow up with your PCP and your cardiologist.   It was a pleasure caring for you, take care!    Information on my medicine - Coumadin   (Warfarin)  This medication education was reviewed with me or my healthcare representative as part of my discharge preparation.  The pharmacist that spoke with me during my hospital stay was:  Arty Baumgartner, [facility]  Why was Coumadin prescribed for you? Coumadin was prescribed for you because you have a blood clot or a medical condition that can cause an increased risk of forming blood clots. Blood clots can cause serious health problems by blocking the flow of blood to the heart, lung, or brain. Coumadin can prevent harmful blood clots from forming. As a reminder your indication for Coumadin is:   Deep Vein Thrombosis Treatment history of recurrent blood clots in the legs What test will check on my response to Coumadin? While on Coumadin (warfarin) you will need to have an INR test regularly to ensure that your dose is keeping you in the desired range. The INR (international normalized ratio) number is calculated from the result of the laboratory test called prothrombin time (PT).  If an INR APPOINTMENT HAS NOT ALREADY BEEN MADE FOR YOU please schedule an appointment to have this lab work done by your health care provider within 7 days. Your INR goal is usually a number between:  2 to 3 or your provider may give you a more narrow range like 2-2.5.  Ask your health care provider during an office visit what your goal INR is.  What  do you need to  know  About  COUMADIN? Take Coumadin  (warfarin) exactly as prescribed by your healthcare provider about the same time each day.  DO NOT stop taking without talking to the doctor who prescribed the medication.  Stopping without other blood clot prevention medication to take the place of Coumadin may increase your risk of developing a new clot or stroke.  Get refills before you run out.  What do you do if you miss a dose? If you miss a dose, take it as soon as you remember on the same day then continue your regularly scheduled regimen the next day.  Do not take two doses of Coumadin at the same time.  Important Safety Information A possible side effect of Coumadin (Warfarin) is an increased risk of bleeding. You should call your healthcare provider right away if you experience any of the following: ? Bleeding from an injury or your nose that does not stop. ? Unusual colored urine (red or dark brown) or unusual colored stools (red or black). ? Unusual bruising for unknown reasons. ? A serious fall or if you hit your head (even if there is no bleeding).  Some foods or medicines interact with Coumadin (warfarin) and might alter your response to warfarin. To help avoid this: ? Eat a balanced diet, maintaining a consistent amount of Vitamin K. ? Notify your provider about major diet changes you plan to make. ? Avoid alcohol or limit your intake to 1 drink for women and 2 drinks for men  per day. (1 drink is 5 oz. wine, 12 oz. beer, or 1.5 oz. liquor.)  Make sure that ANY health care provider who prescribes medication for you knows that you are taking Coumadin (warfarin).  Also make sure the healthcare provider who is monitoring your Coumadin knows when you have started a new medication including herbals and non-prescription products.  Coumadin (Warfarin)  Major Drug Interactions  Increased Warfarin Effect Decreased Warfarin Effect  Alcohol (large quantities) Antibiotics (esp. Septra/Bactrim, Flagyl, Cipro) Amiodarone  (Cordarone) Aspirin (ASA) Cimetidine (Tagamet) Megestrol (Megace) NSAIDs (ibuprofen, naproxen, etc.) Piroxicam (Feldene) Propafenone (Rythmol SR) Propranolol (Inderal) Isoniazid (INH) Posaconazole (Noxafil) Barbiturates (Phenobarbital) Carbamazepine (Tegretol) Chlordiazepoxide (Librium) Cholestyramine (Questran) Griseofulvin Oral Contraceptives Rifampin Sucralfate (Carafate) Vitamin K   Coumadin (Warfarin) Major Herbal Interactions  Increased Warfarin Effect Decreased Warfarin Effect  Garlic Ginseng Ginkgo biloba Coenzyme Q10 Green tea St. Johns wort    Coumadin (Warfarin) FOOD Interactions  Eat a consistent number of servings per week of foods HIGH in Vitamin K (1 serving =  cup)  Collards (cooked, or boiled & drained) Kale (cooked, or boiled & drained) Mustard greens (cooked, or boiled & drained) Parsley *serving size only =  cup Spinach (cooked, or boiled & drained) Swiss chard (cooked, or boiled & drained) Turnip greens (cooked, or boiled & drained)  Eat a consistent number of servings per week of foods MEDIUM-HIGH in Vitamin K (1 serving = 1 cup)  Asparagus (cooked, or boiled & drained) Broccoli (cooked, boiled & drained, or raw & chopped) Brussel sprouts (cooked, or boiled & drained) *serving size only =  cup Lettuce, raw (green leaf, endive, romaine) Spinach, raw Turnip greens, raw & chopped   These websites have more information on Coumadin (warfarin):  FailFactory.se; VeganReport.com.au;

## 2017-04-11 NOTE — Care Management Note (Addendum)
Case Management Note  Patient Details  Name: [name] MRN: [id number] Date of Birth: [birth year]  Subjective/Objective:                 Spoke w patient. He states he would like RW for home use. He needs a prior [place] from insurance. DME benefit will be 20% coinsurance. Prior [name] is required for DME. MD will need to call [phone]. Patient is not interested in [facility] at this time, but would like cardiac rehab.  Spoke w resident, made aware of patient wishes for DC and need for prior auth.    Action/Plan:   Expected Discharge Date:  04/09/17               Expected Discharge Plan:  Home/Self Care  In-House Referral:     Discharge planning Services  CM Consult  Post Acute Care Choice:  Durable Medical Equipment Choice offered to:  Patient  DME Arranged:  [facility] R[facility] rolling DME Agency:     HH Arranged:  Patient Refused [place] Agency:     Status of Service:  In process, will continue to follow  If discussed at Long Length of Stay Meetings, dates discussed:    Additional Comments:  Carles Collet, RN 04/11/2017, 1:47 PM

## 2017-04-12 ENCOUNTER — Encounter (HOSPITAL_COMMUNITY): Payer: BLUE CROSS/BLUE SHIELD

## 2017-04-12 DIAGNOSIS — Z452 Encounter for adjustment and management of vascular access device: Secondary | ICD-10-CM

## 2017-04-12 DIAGNOSIS — Z7189 Other specified counseling: Secondary | ICD-10-CM

## 2017-04-12 DIAGNOSIS — R197 Diarrhea, unspecified: Secondary | ICD-10-CM

## 2017-04-12 LAB — GLUCOSE, CAPILLARY
Glucose-Capillary: 185 mg/dL — ABNORMAL HIGH (ref 65–99)
Glucose-Capillary: 219 mg/dL — ABNORMAL HIGH (ref 65–99)
Glucose-Capillary: 182 mg/dL — ABNORMAL HIGH (ref 65–99)
Glucose-Capillary: 97 mg/dL (ref 65–99)

## 2017-04-12 LAB — PROTIME-INR
INR: 2.93
INR: 2.87
PROTHROMBIN TIME: 30.6 s — AB (ref 11.4–15.2)
Prothrombin Time: 31.2 seconds — ABNORMAL HIGH (ref 11.4–15.2)

## 2017-04-12 LAB — CBC
HEMATOCRIT: 33.4 % — AB (ref 39.0–52.0)
Hemoglobin: 10.9 g/dL — ABNORMAL LOW (ref 13.0–17.0)
MCH: 26.3 pg (ref 26.0–34.0)
MCHC: 32.6 g/dL (ref 30.0–36.0)
MCV: 80.7 fL (ref 78.0–100.0)
PLATELETS: 251 10*3/uL (ref 150–400)
RBC: 4.14 MIL/uL — AB (ref 4.22–5.81)
RDW: 14.6 % (ref 11.5–15.5)
WBC: 8.2 10*3/uL (ref 4.0–10.5)

## 2017-04-12 LAB — BASIC METABOLIC PANEL
ANION GAP: 9 (ref 5–15)
BUN: 41 mg/dL — ABNORMAL HIGH (ref 6–20)
CALCIUM: 8.2 mg/dL — AB (ref 8.9–10.3)
CO2: 25 mmol/L (ref 22–32)
Chloride: 100 mmol/L — ABNORMAL LOW (ref 101–111)
Creatinine, Ser: 2.14 mg/dL — ABNORMAL HIGH (ref 0.61–1.24)
GFR, EST AFRICAN AMERICAN: 39 mL/min — AB (ref >60)
GFR, EST NON AFRICAN AMERICAN: 34 mL/min — AB (ref >60)
Glucose, Bld: 150 mg/dL — ABNORMAL HIGH (ref 65–99)
POTASSIUM: 3.7 mmol/L (ref 3.5–5.1)
Sodium: 134 mmol/L — ABNORMAL LOW (ref 135–145)

## 2017-04-12 NOTE — Progress Notes (Signed)
Physical Therapy Treatment Patient Details Name: [name] MRN: [id number] DOB: 1966-06-07 Today's Date: 04/12/2017    History of Present Illness [name] is a 51 y.o. male presenting with chest pain, nausea/vomiting x 3 days. PMH is significant for CHF (EF25-30%), NSTEMI, CKD stage 3, Chrohns, DVTs, colectomy/appendectomy/hernia repair, neoplasm of colon, hypercholesterolemia    PT Comments    Pt is up to walk with PT this afternoon, reviewed LE exercises with pt following instructions well.  He is demonstrating increased tolerance for all activity, and note his improved effort for all portions of therapy.  Will continue acutely as he is scheduled to get home and will not be attended 24/7.  Pt is positive and very motivated to progress and recover.   Follow Up Recommendations  Home health PT;Supervision - Intermittent     Equipment Recommendations  Rolling walker with 5" wheels    Recommendations for Other Services       Precautions / Restrictions Precautions Precautions: Fall Restrictions Weight Bearing Restrictions: No    Mobility  Bed Mobility               General bed mobility comments: up to chair today  Transfers Overall transfer level: Needs assistance Equipment used: Rolling walker (2 wheeled) Transfers: Sit to/from Stand Sit to Stand: Supervision         General transfer comment: to supervise his hand placement and extra lines  Ambulation/Gait Ambulation/Gait assistance: Min guard Ambulation Distance (Feet): 200 Feet Assistive device: Rolling walker (2 wheeled) Gait Pattern/deviations: Step-through pattern;Decreased stride length;Trunk flexed Gait velocity: reduced Gait velocity interpretation: Below normal speed for age/gender General Gait Details: HR was stable and O2 sats were above 95% the entire trip   Stairs            Wheelchair Mobility    Modified Rankin (Stroke Patients Only)       Balance Overall balance  assessment: Needs assistance Sitting-balance support: Feet supported Sitting balance-[name]: Good     Standing balance support: Bilateral upper extremity supported Standing balance-[name]: Fair                              Cognition Arousal/Alertness: Awake/alert Behavior During Therapy: WFL for tasks assessed/performed Overall Cognitive Status: Within Functional Limits for tasks assessed                                        Exercises General Exercises - Lower Extremity Ankle Circles/Pumps: AROM;Both;10 reps Quad Sets: AROM;Both;10 reps Gluteal Sets: AROM;Both;10 reps Hip ABduction/ADduction: AROM;Both;10 reps    General Comments        Pertinent Vitals/Pain Pain Assessment: No/denies pain    Home Living                      Prior Function            PT Goals (current goals can now be found in the care plan section) Acute Rehab PT Goals Patient Stated Goal: return home Progress towards PT goals: Progressing toward goals    Frequency    Min 3X/week      PT Plan Current plan remains appropriate    Co-evaluation              AM-PAC PT "6 Clicks" Daily Activity  Outcome Measure  Difficulty turning over  in bed (including adjusting bedclothes, sheets and blankets)?: None Difficulty moving from lying on back to sitting on the side of the bed? : None Difficulty sitting down on and standing up from a chair with arms (e.g., wheelchair, bedside commode, etc,.)?: None Help needed moving to and from a bed to chair (including a wheelchair)?: A Little Help needed walking in hospital room?: A Little Help needed climbing 3-5 steps with a railing? : A Little 6 Click Score: 21    End of Session Equipment Utilized During Treatment: Gait belt Activity Tolerance: Patient tolerated treatment well Patient left: in chair;with call bell/phone within reach Nurse Communication: Mobility status PT Visit Diagnosis:  Unsteadiness on feet (R26.81);Muscle weakness (generalized) (M62.81)     Time: 5694-3700 PT Time Calculation (min) (ACUTE ONLY): 21 min  Charges:  $Gait Training: 8-22 mins                    G Codes:  Functional Assessment Tool Used: AM-PAC 6 Clicks Basic Mobility    Ramond Dial 04/12/2017, 6:33 PM   Mee Hives, PT MS Acute Rehab Dept. Number: [place] and [place]

## 2017-04-12 NOTE — Progress Notes (Signed)
Patient ID: [name], male   DOB: 30-May-1966, 51 y.o.   MRN: [id number]     Advanced Heart Failure Rounding Note  Primary Cardiologist: Aundra Dubin  Subjective:    Weight up 4 pounds overnight, creatinine 1.79->2.00->2.14.   Abdominal US: No gallstones/cholecystitis, fatty liver.   Echo: EF 20-25%, diffuse hypokinesis, mild MR.   Objective:   Weight Range: 178 lb 1.6 oz (80.8 kg) Body mass index is 27.08 kg/m.   Vital Signs:   Temp:  [97.2 F (36.2 C)-98.3 F (36.8 C)] 98.3 F (36.8 C) (08/08 0523) Pulse Rate:  [60-92] 60 (08/08 0523) Resp:  [18] 18 (08/08 0523) BP: (111-138)/(75-83) 138/75 (08/08 0523) SpO2:  [98 %-100 %] 100 % (08/08 0523) Weight:  [178 lb 1.6 oz (80.8 kg)] 178 lb 1.6 oz (80.8 kg) (08/08 0523) Last BM Date: 04/11/17  Weight change: Filed Weights   04/10/17 2113 04/11/17 0544 04/12/17 0523  Weight: 179 lb 4.8 oz (81.3 kg) 174 lb 14.4 oz (79.3 kg) 178 lb 1.6 oz (80.8 kg)    Intake/Output:   Intake/Output Summary (Last 24 hours) at 04/12/17 0745 Last data filed at 04/12/17 0407  Gross per 24 hour  Intake             1230 ml  Output                0 ml  Net             1230 ml      Physical Exam    General: Well appearing. No resp difficulty. HEENT: Normal Neck: Supple. JVP 5-6. Carotids 2+ bilat; no bruits. No thyromegaly or nodule noted. Cor: PMI nondisplaced. RRR, No M/G/R noted Lungs: CTAB, normal effort. Abdomen: Soft, non-tender, non-distended, no HSM. No bruits or masses. +BS  Extremities: No cyanosis, clubbing, rash, R and LLE no edema. Warm. RUE PICC.  Neuro: Alert & orientedx3, cranial nerves grossly intact. moves all 4 extremities w/o difficulty. Affect pleasant     Telemetry   NSR - personally reviewed.   EKG   04/05/17 - NSR with nonspecific ST changes - personally reviewed.   Labs    CBC  Recent Labs  04/11/17 0356 04/12/17 0421  WBC 17.4* 8.2  HGB 12.3* 10.9*  HCT 37.1* 33.4*  MCV 81.7 80.7  PLT 227  147   Basic Metabolic Panel  Recent Labs  04/11/17 0356 04/12/17 0421  NA 137 134*  K 4.1 3.7  CL 101 100*  CO2 26 25  GLUCOSE 157* 150*  BUN 37* 41*  CREATININE 2.00* 2.14*  CALCIUM 8.7* 8.2*    BNP: BNP (last 3 results)  Recent Labs  01/17/17 1204 02/08/17 1250 04/05/17 1510  BNP 353.6* 1,330.6* 2,214.8*       Imaging   Transthoracic Echocardiography 04/06/17  Study Conclusions  - Left ventricle: The cavity size was moderately dilated. Wall   thickness was increased in a pattern of mild LVH. There was mild   concentric hypertrophy. Systolic function was severely reduced.   The estimated ejection fraction was in the range of 20% to 25%.   Diffuse hypokinesis. Doppler parameters are consistent with a   reversible restrictive pattern, indicative of decreased left   ventricular diastolic compliance and/or increased left atrial   pressure (grade 3 diastolic dysfunction). Doppler parameters are   consistent with high ventricular filling pressure. - Aortic valve: There was trivial regurgitation. - Mitral valve: There was mild regurgitation. Valve area by   pressure half-time: 2.12  cm^2. - Left atrium: The atrium was severely dilated. - Right atrium: The atrium was mildly dilated. - Atrial septum: No defect or patent foramen ovale was identified   by color flow Doppler. - Pulmonary arteries: Systolic pressure was within the normal   range. PA peak pressure: 18 mm Hg (S).  Medications:     Scheduled Medications: . atorvastatin  80 mg Oral q1800  . carvedilol  3.125 mg Oral BID WC  . clopidogrel  75 mg Oral Daily  . digoxin  0.125 mg Oral Daily  . enoxaparin (LOVENOX) injection  1.5 mg/kg Subcutaneous Q24H  . gabapentin  600 mg Oral BID  . insulin aspart  0-9 Units Subcutaneous TID WC  . isosorbide-hydrALAZINE  1 tablet Oral TID  . metoCLOPramide  5 mg Oral Q8H   Or  . metoCLOPramide (REGLAN) injection  5 mg Intravenous Q8H  . ranolazine  500 mg Oral BID   . sodium chloride flush  10-40 mL Intracatheter Q12H  . spironolactone  12.5 mg Oral Daily  . torsemide  40 mg Oral QPM  . torsemide  80 mg Oral Q breakfast  . 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   Assessment/Plan   1. Acute on chronic systolic CHF: Ischemic cardiomyopathy. St Jude ICD.  Echo this admission with EF 20-25% (fairly stable). He was admitted with volume overload and dyspnea as well as nausea/vomiting.  - NYHA II - Volume status stable on exam, weight up 4 pounds. Will restart torsemide today. 80 mg am/38m pm.  - Continue Bidil 1 tab TID.  - Continue Coreg and digoxin. Dig level done a week ago and was < 0.2.  - Continue Spiro 12.5 mg daily - No ARB/ARNI with CKD - We will see him in our office next week. Will set up [facility].   2. CAD: s/p PCI in 3/18 with DES to RCA and mid LAD.  - Denies chest pain - Continue Plavix, not on ASA with warfarin use.  - Continue statin.  - No change to current plan.   3. H/o DVTs (recurrent): None recently.  - INR 2.93. Will stop lovenox today.   4. Diabetes: Presented with DKA, anion gap has now closed.  Per primary service. Empagliflozin would be good choice in him for diabetes management if insurance will cover.  - No change to current plan.   5. Abdominal pain/nausea: Abdominal UKoreawith no gallstones.  ?Due to gastroparesis + gut edema with CHF. - minimal diarrhea overnight. No leukocytosis. No abdominal pain today. Cdiff pending.    6. AKI on CKD Stage 3. - Creatinine 2.14 today.     Length of Stay: 7[place] NP  04/12/2017, 7:45 AM  Advanced Heart Failure Team Pager [phone](M-F; 7a - 4p)  Please contact [facility] for night-coverage after hours (4p -7a ) and weekends on [email or web address]  Agree with the above note.  Would restart torsemide today at 80 qam/40 qpm.  He can go home with close followup of renal function.   DDeckard Stuber8/04/2017

## 2017-04-12 NOTE — Progress Notes (Addendum)
Occupational Therapy Treatment and Discharge Patient Details Name: [name] MRN: [id number] DOB: 1966/06/19 Today's Date: 04/12/2017    History of present illness [name] is a 51 y.o. male presenting with chest pain, nausea/vomiting x 3 days. PMH is significant for CHF (EF25-30%), NSTEMI, CKD stage 3, Chrohns, DVTs, colectomy/appendectomy/hernia repair, neoplasm of colon, hypercholesterolemia   OT comments  This 51 yo male admitted with above presents to acute OT at a Mod I level for basic ADLs, no further OT needs, we will sign off.  Follow Up Recommendations  No OT follow up    Equipment Recommendations  None recommended by OT       Precautions / Restrictions Precautions Precautions: None Restrictions Weight Bearing Restrictions: No       Mobility Bed Mobility               General bed mobility comments: pt in chair upon arrival  Transfers Overall transfer level: Modified independent Equipment used: Rolling walker (2 wheeled) Transfers: Sit to/from Stand                Balance Overall balance assessment: Needs assistance Sitting-balance support: Feet supported;No upper extremity supported Sitting balance-[name]: Normal     Standing balance support: No upper extremity supported Standing balance-[name]: Fair                             ADL either performed or assessed with clinical judgement   ADL Overall ADL's : Modified independent                                       General ADL Comments: Pt able to doff/donn socks sitting in recliner; able to ambulate with RW to bathroom, up/down from toilet with grab bar, back to recliner without any LOB or DOE. I do not see that he will have any issues with basic ADLs and he agrees. Pt did not display in increased work of breathing or DOE to warrant energy conservation strategies. We did discuss how he could use his RW to drape things over to get them from  place to place and/or get himself a bag that he could tie onto his walker.     Vision Baseline Vision/History: Wears glasses Wears Glasses: At all times Patient Visual Report: No change from baseline            Cognition Arousal/Alertness: Awake/alert Behavior During Therapy: WFL for tasks assessed/performed Overall Cognitive Status: Within Functional Limits for tasks assessed                                                     Pertinent Vitals/ Pain       Pain Assessment: No/denies pain            Progress Toward Goals  OT Goals(current goals can now be found in the care plan section)  Progress towards OT goals: Goals met/education completed, patient discharged from [facility] Discharge plan remains appropriate       AM-PAC PT "6 Clicks" Daily Activity     Outcome Measure   Help from another person eating meals?: None Help from another person taking care of  personal grooming?: None Help from another person toileting, which includes using toliet, bedpan, or urinal?: None Help from another person bathing (including washing, rinsing, drying)?: None Help from another person to put on and taking off regular upper body clothing?: None Help from another person to put on and taking off regular lower body clothing?: None 6 Click Score: 24    End of Session Equipment Utilized During Treatment: Rolling walker  OT Visit Diagnosis: Unsteadiness on feet (R26.81) (feels better with RW for stability per pt)   Activity Tolerance Patient tolerated treatment well   Patient Left in chair;with call bell/phone within reach   Nurse Communication          Time: 4562-5638 OT Time Calculation (min): 9 min  Charges: OT General Charges $OT Visit: 1 Procedure OT Treatments $Self Care/Home Management : 8-22 mins  [place], OTR/L [phone] 04/12/2017

## 2017-04-12 NOTE — Progress Notes (Addendum)
Family Medicine Teaching Service Daily Progress Note Intern Pager: [phone]  Patient name: [name] Medical record number: [id number] Date of birth: [birth year] Age: 51 y.o. Gender: male  Primary Care Provider: [name], MD Consultants: cardio Code Status: full  Overview and major events to date [name] a 51 y.o.malepresenting with chest pain, nausea/vomiting x 3 days. PMH is significant for CHF (EF25-30%), NSTEMI, CKD stage 3, Chrohns, DVTs, colectomy/appendectomy/hernia repair, neoplasm of colon, hypercholesterolemia. Significant events include PIC line 8/2 for CVP pressures.  Transferred to telemetry on 8/6.  Assessment and Plan: Massimo E [name] a 51 y.o.malepresenting with chest pain, nausea/vomiting x 3 days likely caused by CHF exacerbation. PMH is significant for CHF (EF25-30%), NSTEMI, CKD stage 3, Chrohns, DVTs, colectomy/appendectomy/hernia repair, neoplasm of colon, hypercholesterolemia  Chest Pain/CHF Exacerbation,: Admission BNP 2214.8, Digoxin level <0.2.Troponin negative 2. Chest x-ray showing signs of pulmonary vascular congestion with small right pleural effusion and trace left pleural effusion. Patient likely having CHF exacerbation again versus ACS event. HEART Score of 5.  -12M followed by cardio -EF now 20-25% (8/2 echo) -Vital signs per floor protocol, Continuous cardiac monitoring, Pulse oximetry  -CVP via PIC line per cardio -per cardiology, possible candidate for LVAD -Strict I's and O's, Daily weights - Daily EKG - Reglan  -torsemide  -carvedilol -digoxin -IMDUR 61m daily -prn nitro 494m-renazoline 50062mD -spironolactone -palliative recommends HH -Bidil TID transferring to telemetry  Nausea, Vomiting: Likely due to CHF gastroperesis vs viral gastro enteritis/DKA related edema -mostly resolved. -phenrgan or zophran prn  T2DM- takes 37 units of Lantus daily @home . Last hemoglobin A1c in  02/09/2017; 7.5, glucosuria >500 -Lantus 2 (8/3) -SSI sensitive -will address as labs come in with lantus -cardio suggests considering empagiflozin  Diabetic Neuropathy -increased to 600m59mD gabapentin on 8/6  CKD stage 3: Baseline creatinine appears to be around 1.32- 2.2 (difficult to determine as he has had variable creatinine this year) however last recorded creatinine prior to this admission was 1.46 in June 2018.  - most recent creatinine 2.14 -Daily BMP -per cards, avoid nephrotoxic agents -spirinolactone  Hypertension:Normotensive -Continue home Coreg, Imdur, digoxin per cardio -Holding home torsemide -per cardiology, hold lasix and ACE/ARB/ARNI w/ elevated Cr  Significant GIhistory: History of Crohn's disease, colectomy, diverticulosis, rectal polyp, and gastroparesis (secondary to diabetes). Hx of chronic nausea and vomiting.  -Continue home Reglan -Phenergan prn -Abdominal imaging as above  Diarrhea -reported started evening 8/6, 5-6 watery stool w/o signs of blood -afebrile, but is on tylenol..no abx and home history of chronic diarrhea 2/2 crohns makes cdiff less likely, but white count did increase suddenly to 17 which makes infectious cause more likely -pt asymptomatic o/n, cdiff canceled, GI panel pending -enteric precautions -patient thinks it is connected to drinking "unfiltered water" at hospital, only drinks bottle water at home.  History of recurrent DVTs: On Coumadin at home -Coumadin per pharmacy bridging from lovenox -plavix  Hyperlipidemia:  - Continue home atorvastatin  Pain -tylenol prn  FEN/GI: renal/carb diet Prophylaxis: coumadin per pharmacy  Disposition:home pending Cardiology/FM clearance    Subjective:  Pt was in family meeting today w/ palliative. Will reassess in the PM in subsequent progress note.   Objective: Temp:  [97.2 F (36.2 C)-98.3 F (36.8 C)] 98.3 F (36.8 C) (08/08 0523) Pulse Rate:  [60-92] 60  (08/08 0523) Resp:  [18] 18 (08/08 0523) BP: (111-138)/(75-83) 138/75 (08/08 0523) SpO2:  [98 %-100 %] 100 % (08/08 0523) Weight:  [178 lb 1.6 oz (80.8 kg)]  178 lb 1.6 oz (80.8 kg) (08/08 0523) Physical Exam: General: awake, NAD Cardiovascular: Regular rhythm but tachy at about 322-025, systolic murmur Respiratory: normal work of breathing, no obvious rhonchi Gastrointestinal: soft belly to exam,no tenderness expressed to palpation MSK: no pitting edema on lower extremities bilaterally,no pain in feet  Laboratory:  Recent Labs Lab 04/10/17 0451 04/11/17 0356 04/12/17 0421  WBC 5.5 17.4* 8.2  HGB 10.2* 12.3* 10.9*  HCT 31.6* 37.1* 33.4*  PLT 226 227 251    Recent Labs Lab 04/05/17 2235  04/10/17 0451 04/11/17 0356 04/12/17 0421  NA  --   < > 136 137 134*  K  --   < > 3.7 4.1 3.7  CL  --   < > 100* 101 100*  CO2  --   < > 29 26 25   BUN  --   < > 30* 37* 41*  CREATININE  --   < > 1.79* 2.00* 2.14*  CALCIUM  --   < > 8.1* 8.7* 8.2*  PROT 7.4  --   --   --   --   BILITOT 1.5*  --   --   --   --   ALKPHOS 130*  --   --   --   --   ALT 19  --   --   --   --   AST 25  --   --   --   --   GLUCOSE  --   < > 243* 157* 150*  < > = values in this interval not displayed.    Imaging/Diagnostic Tests: Dg Chest 2 View  Result Date: 04/05/2017 CLINICAL DATA:  Four days of sharp sub sternal non radiating chest discomfort. Two days of nausea and vomiting with inability to take medications. History of diabetes, hypertension, ischemic cardiomyopathy with dysrhythmia is, previous MI, nonsmoker. EXAM: CHEST  2 VIEW COMPARISON:  PA and lateral chest x-ray of February 11, 2017 FINDINGS: The lungs are mildly hypoinflated. There is a new small right pleural effusion as well as trace left pleural effusion. The cardiac silhouette remains enlarged. The pulmonary vascularity is more engorged and there is cephalization of the vascular pattern. The ICD is in stable position. The bony thorax is  unremarkable. The gas pattern in the upper abdomen is nonspecific. IMPRESSION: CHF with pulmonary vascular congestion and new small right and trace left pleural effusions. The appearance of the chest has deteriorated since the previous study. Electronically Signed   By: David  Martinique M.D.   On: 04/05/2017 12:15   Dg Abd 2 Views  Result Date: 04/05/2017 CLINICAL DATA:  Nausea and vomiting. EXAM: ABDOMEN - 2 VIEW COMPARISON:  11/17/2011 abdominal radiograph. 02/11/2008 CT abdomen and pelvis. FINDINGS: There is no evidence of intraperitoneal free air. Scattered colonic gas is present (including in sigmoid colon which courses into the right lower quadrant). No dilated loops of bowel are seen to suggest obstruction. An ICD is partially visualized. Lung findings, including a small right pleural effusion, are more fully evaluated on today's earlier chest radiographs. IMPRESSION: Nonobstructed bowel gas pattern. Electronically Signed   By: Logan Bores M.D.   On: 04/05/2017 17:04   US Abdomen Limited Ruq  Result Date: 04/06/2017 CLINICAL DATA:  Nausea and vomiting for 3 days. History of CHF, Crohn's disease, hypertension, diabetes. Previous colectomy, appendectomy, hernia repair. EXAM: ULTRASOUND ABDOMEN LIMITED RIGHT UPPER QUADRANT COMPARISON:  CT abdomen and pelvis 02/11/2008. Abdominal radiographs 04/05/2017 FINDINGS: Gallbladder: No gallstones or wall thickening visualized.  No sonographic Murphy sign noted by sonographer. Common bile duct: Diameter: 4.1 mm, normal Liver: Heterogeneous increased parenchymal echotexture suggesting fatty infiltration of the liver. No focal lesions identified. Limited visualization of the liver due to rib shadowing. IMPRESSION: No evidence of cholelithiasis or cholecystitis. Probable fatty infiltration of the liver. Electronically Signed   By: Lucienne Capers M.D.   On: 04/06/2017 23:05     Bonnita Hollow, MD 04/12/2017, 12:04 PM PGY-1, [place] Intern  pager: [phone], text pages welcome

## 2017-04-12 NOTE — Progress Notes (Signed)
CPT Code for Prior Auth for walker: E0143 Walker, folding, wheeled, adjustable or fixed height

## 2017-04-12 NOTE — Progress Notes (Signed)
Family Medicine Teaching Service Daily Progress Note Intern Pager: [phone]  Patient name: [name] Medical record number: [id number] Date of birth: March 31, 1966 Age: 51 y.o. Gender: male  Primary Care Provider: [name], MD Consultants: cardio Code Status: full  Overview and major events to date [name] a 51 y.o.malepresenting with chest pain, nausea/vomiting x 3 days. PMH is significant for CHF (EF25-30%), NSTEMI, CKD stage 3, Chrohns, DVTs, colectomy/appendectomy/hernia repair, neoplasm of colon, hypercholesterolemia. Significant events include PIC line 8/2 for CVP pressures.  Transferred to telemetry on 8/6  Assessment and Plan: Talal E [name] a 51 y.o.malepresenting with chest pain, nausea/vomiting x 3 days likely caused by CHF exacerbation. PMH is significant for CHF (EF25-30%), NSTEMI, CKD stage 3, Chrohns, DVTs, colectomy/appendectomy/hernia repair, neoplasm of colon, hypercholesterolemic  Chest Pain/CHF Exacerbation,: Admission BNP 2214.8, Digoxin level <0.2.Troponin negative 2. Chest x-ray showing signs of pulmonary vascular congestion with small right pleural effusion and trace left pleural effusion. Patient likely having CHF exacerbation again versus ACS event. HEART Score of 5.  -70M followed by cardio -EF now 20-25% (8/2 echo) -Vital signs per floor protocol, Continuous cardiac monitoring, Pulse oximetry  -CVP via PIC line per cardio -per cardiology, possible candidate for LVAD -Strict I's and O's, Daily weights - Daily EKG -palliative met with family, outlined advanced directives, potential LVAD placement -telemetry -torsemide  -carvedilol -digoxin -prn nitro 66m -renazoline 5067mID -spironolactone -Bidil TID  Nausea, Vomiting: Likely due to CHF gastroperesis vs viral gastro enteritis/DKA related edema -Reglan  -one episode of emesis yesterday -phenrgan or zophran prn  T2DM- takes 37 units of Lantus daily  @home . Last hemoglobin A1c in 02/09/2017; 7.5, glucosuria >500 -Lantus 2 (8/3) -SSI sensitive -CBGs at goal on sliding scale, 5-8 units aspart -cardio suggests considering empagiflozin  Diabetic Neuropathy -6007mID gabapentin  CKD stage 3: Baseline creatinine appears to be around 1.32- 2.2 (difficult to determine as he has had variable creatinine this year) however last recorded creatinine prior to this admission was 1.46 in June 2018.  - most recent creatinine 2.0 -Daily BMP -per cards, avoid nephrotoxic agents -spirinolactone  Hypertension:Normotensive -Continue home Coreg, Imdur, digoxin per cardio -Holding home torsemide -per cardiology, hold lasix and ACE/ARB/ARNI w/ elevated Cr  Significant GIhistory: History of Crohn's disease, colectomy, diverticulosis, rectal polyp, and gastroparesis (secondary to diabetes). Hx of chronic nausea and vomiting.  -Continue home Reglan -Phenergan prn -Abdominal imaging as above  Diarrhea -reported started evening 8/6, 5-6 watery stool w/o signs of blood -afebrile, but is on tylenol..no abx and home history of chronic diarrhea 2/2 crohns makes cdiff less likely, but white count did increase suddenly to 17 on8/7 then decrease to 8.2 8/8   -quick c.diff. And gastoenteric panel - enteric precautions -patient thinks it is connected to drinking "unfiltered water" at hospital, only drinks bottle water at home.  History of recurrent DVTs: On Coumadin at home -Coumadin per pharmacy bridging from lovenox -plavix  Hyperlipidemia:  - Continue home atorvastatin  Pain -tylenol prn  FEN/GI: renal/carb diet Prophylaxis: coumadin per pharmacy  Disposition:home pending Cardiology/FM clearance   Subjective:  Feels well and would like to go home.  Diarrhea has resolved.  Objective: Temp:  [98 F (36.7 C)-98.1 F (36.7 C)] 98 F (36.7 C) (08/09 0458) Pulse Rate:  [83-87] 87 (08/09 0458) Resp:  [18-20] 18 (08/08 2003) BP:  (104-111)/(66-79) 111/79 (08/09 0458) SpO2:  [96 %-97 %] 96 % (08/09 0458) Weight:  [178 lb 3.2 oz (80.8 kg)] 178 lb 3.2 oz (80.8 kg) (  08/09 0458) Physical Exam: General: looking well, talking on phone during exam, alert and cheerful Cardiovascular: RRR, systolic murmur, no pitting edema on exam Respiratory: Lungs sounded clear bilaterally, no wheezes/rhonchi Abdomen: soft to palpation with no tenderness Extremities: no edema  Laboratory:  Recent Labs Lab 04/11/17 0356 04/12/17 0421 04/13/17 0400  WBC 17.4* 8.2 7.1  HGB 12.3* 10.9* 10.9*  HCT 37.1* 33.4* 32.8*  PLT 227 251 257    Recent Labs Lab 04/11/17 0356 04/12/17 0421 04/13/17 0400  NA 137 134* 134*  K 4.1 3.7 3.6  CL 101 100* 100*  CO2 26 25 25   BUN 37* 41* 44*  CREATININE 2.00* 2.14* 2.11*  CALCIUM 8.7* 8.2* 8.3*  GLUCOSE 157* 150* 184*      Imaging/Diagnostic Tests: Dg Chest 2 View  Result Date: 04/05/2017 CLINICAL DATA:  Four days of sharp sub sternal non radiating chest discomfort. Two days of nausea and vomiting with inability to take medications. History of diabetes, hypertension, ischemic cardiomyopathy with dysrhythmia is, previous MI, nonsmoker. EXAM: CHEST  2 VIEW COMPARISON:  PA and lateral chest x-ray of February 11, 2017 FINDINGS: The lungs are mildly hypoinflated. There is a new small right pleural effusion as well as trace left pleural effusion. The cardiac silhouette remains enlarged. The pulmonary vascularity is more engorged and there is cephalization of the vascular pattern. The ICD is in stable position. The bony thorax is unremarkable. The gas pattern in the upper abdomen is nonspecific. IMPRESSION: CHF with pulmonary vascular congestion and new small right and trace left pleural effusions. The appearance of the chest has deteriorated since the previous study. Electronically Signed   By: David  Martinique M.D.   On: 04/05/2017 12:15   Dg Abd 2 Views  Result Date: 04/05/2017 CLINICAL DATA:  Nausea and  vomiting. EXAM: ABDOMEN - 2 VIEW COMPARISON:  11/17/2011 abdominal radiograph. 02/11/2008 CT abdomen and pelvis. FINDINGS: There is no evidence of intraperitoneal free air. Scattered colonic gas is present (including in sigmoid colon which courses into the right lower quadrant). No dilated loops of bowel are seen to suggest obstruction. An ICD is partially visualized. Lung findings, including a small right pleural effusion, are more fully evaluated on today's earlier chest radiographs. IMPRESSION: Nonobstructed bowel gas pattern. Electronically Signed   By: Logan Bores M.D.   On: 04/05/2017 17:04   US Abdomen Limited Ruq  Result Date: 04/06/2017 CLINICAL DATA:  Nausea and vomiting for 3 days. History of CHF, Crohn's disease, hypertension, diabetes. Previous colectomy, appendectomy, hernia repair. EXAM: ULTRASOUND ABDOMEN LIMITED RIGHT UPPER QUADRANT COMPARISON:  CT abdomen and pelvis 02/11/2008. Abdominal radiographs 04/05/2017 FINDINGS: Gallbladder: No gallstones or wall thickening visualized. No sonographic Murphy sign noted by sonographer. Common bile duct: Diameter: 4.1 mm, normal Liver: Heterogeneous increased parenchymal echotexture suggesting fatty infiltration of the liver. No focal lesions identified. Limited visualization of the liver due to rib shadowing. IMPRESSION: No evidence of cholelithiasis or cholecystitis. Probable fatty infiltration of the liver. Electronically Signed   By: Lucienne Capers M.D.   On: 04/06/2017 23:05     Sherene Sires, DO 04/13/2017, 7:14 AM PGY-1, [place] Intern pager: [phone], text pages welcome

## 2017-04-12 NOTE — Progress Notes (Signed)
Daily Progress Note   Patient Name: [name]       Date: 04/12/2017 DOB: 02-28-1966  Age: 51 y.o. MRN#: [id number] Attending Physician: Dickie La, MD Primary Care Physician: [name], MD Admit Date: 04/05/2017  Consulted by [facility] for: "Establishing goals of care, potential LVAD candidate"  Subjective: Held family meeting with the patient, his two adult children ([name] and Mako) and his 98 yr old son, Marjory Lies. We discussed the patient's goals:  He wants to be able to wake up and not have pain.  It is important to him to be able to care for himself (perform his own ADLs).    We discussed HCPOA and Advanced Directives.  He appointed his two adult children ([name] and [place]) as co- HCPOAs.    I asked - if he is unable to communicate - how are his children to know when he should be let go peacefully?  Mr. Sittner commented that if the doctors are telling his family there is nothing else that they can do then his children should allow him to pass on peacefully.  He supports the use of comfort medications at end of life even if they hasten his passing.  He initialed the advanced directive to NOT have life support given if: 1.  He has a condition that can not be cured that will take his life in a short period of time 2.  He has advanced dementia or another condition that has caused loss of ability to think 3.  He is unconscious and the doctors feel he is unlikely to wake up.  He does not want a feeding tube or artificial feeding if he is near end of life.  He mother had one for her last two years of life and her quality of life was very poor.  In retrospect he feels that she should not have had a feeding tube at EOL therefore he does not want one.  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  He commented that his strength comes from acceptance "some people complain - why me?  I say - why not me."    With regard to LVAD we discussed the need for strong support thru a difficult recovery process.  His family agreed.  We discussed the risks of anesthesia, infection, need for prolonged antibiotics, blood transfusions, potential GI bleed, potential brain hemorrhage.  We briefly discussed the possibility of hemodialysis.    In short, if Mr. Jellison is deemed an appropriate candidate at some point for LVAD - He currently states he is willing to do anything to ensure the success of the procedure and enhance his quality of life.   Assessment: 51 yo gentleman with advanced heart failure, chronic kidney disease and crohns disease.  Feeling better.  Hopeful for discharge soon.   Patient Profile/HPI:  51 y.o. 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  He was admitted on 04/05/2017 with nausea and vomiting.  His BNP was elevated, and CXR showed pulmonary vascular congestion.  Consequently he was admitted for CHF exacerbation and N/V. He has been followed by the Heart Failure Team.  There has been discussion about Cardiomems and eventually possible LVAD.  Since admission he has developed diarrhea.   He had recent admissions in March, April, and June of 2018.    Length of Stay: 7  Current Medications: Scheduled Meds:  . atorvastatin  80 mg Oral q1800  . carvedilol  3.125 mg Oral BID WC  . clopidogrel  75 mg Oral Daily  . digoxin  0.125 mg Oral Daily  . enoxaparin (LOVENOX) injection  1.5 mg/kg Subcutaneous Q24H  . gabapentin  600 mg Oral BID  . insulin aspart  0-9 Units Subcutaneous TID WC  . isosorbide-hydrALAZINE  1 tablet Oral TID  . metoCLOPramide  5 mg  Oral Q8H   Or  . metoCLOPramide (REGLAN) injection  5 mg Intravenous Q8H  . ranolazine  500 mg Oral BID  . sodium chloride flush  10-40 mL Intracatheter Q12H  . spironolactone  12.5 mg Oral Daily  . torsemide  40 mg Oral QPM  . torsemide  80 mg Oral Q breakfast  . Warfarin - Pharmacist Dosing Inpatient   Does not apply q1800    Continuous Infusions:   PRN Meds: acetaminophen **OR** acetaminophen, nitroGLYCERIN, ondansetron (ZOFRAN) IV, promethazine, sodium chloride flush  Physical Exam        Well developed male, sitting on edge of bed.  A&O, in good spirits, cooperative. Resp no increased work of breathing.    Vital Signs: BP 138/75 (BP Location: Right Arm)   Pulse 60   Temp 98.3 F (36.8 C) (Oral)   Resp 18   Ht 5' 8"  (1.727 m)   Wt 80.8 kg (178 lb 1.6 oz) Comment: scale b  SpO2 100%   BMI 27.08 kg/m  SpO2: SpO2: 100 % O2 Device: O2 Device: Not Delivered O2 Flow Rate:    Intake/output summary:  Intake/Output Summary (Last 24 hours) at 04/12/17 0951 Last data filed at 04/12/17 0407  Gross per 24 hour  Intake              990 ml  Output                0 ml  Net              990 ml   LBM: Last BM Date: 04/11/17 Baseline Weight: Weight: 83.9 kg (185 lb) Most recent weight: Weight: 80.8 kg (178 lb 1.6 oz) (scale b)       Palliative Assessment/Data:    Flowsheet Rows  Most Recent Value  Intake Tab  Unit at Time of Referral  Cardiac/Telemetry Unit  Palliative Care Primary Diagnosis  Cardiac  Date Notified  04/09/17  Palliative Care Type  New Palliative care  Reason for referral  Clarify Goals of Care, Advance Care Planning  Date of Admission  04/05/17  Date first seen by Palliative Care  04/11/17  # of days Palliative referral response time  2 Day(s)  # of days IP prior to Palliative referral  4  Clinical Assessment  Palliative Performance Scale Score  50%  Psychosocial & Spiritual Assessment  Palliative Care Outcomes  Patient/Family meeting held?   Yes  Who was at the meeting?  meeting scheduled with patient and dtr   Palliative Care Outcomes  Clarified goals of care, Provided advance care planning      Patient Active Problem List   Diagnosis Date Noted  . Diarrhea   . Palliative care encounter   . Nausea   . CHF exacerbation ([place]) 04/05/2017  . Diabetic keto-acidosis ([place]) 04/05/2017  . Type 2 diabetes mellitus with complication, with long-term current use of insulin ([place]) 03/08/2017  . Acute on chronic combined systolic and diastolic CHF (congestive heart failure) ([place]) 02/08/2017  . CKD (chronic kidney disease), stage III 01/17/2017  . AKI (acute kidney injury) ([place])   . Non-ST elevation (NSTEMI) myocardial infarction ([place])   . CHF (congestive heart failure) ([place]) 09/02/2016  . Elevated serum creatinine   . Encounter for therapeutic drug monitoring 08/03/2016  . Chest pain 04/26/2012  . SVT (supraventricular tachycardia) ([place]) 04/26/2012  . Gastroparesis due to DM ([place]) 11/18/2011  . Nausea & vomiting 11/17/2011  . Coronary artery disease   . Ischemic cardiomyopathy   . Essential hypertension   . Automatic implantable cardioverter-defibrillator in situ   . Deep venous thrombosis ([place])   . Hypersomnolent   . POLYP, COLON 09/25/2008  . DM 09/25/2008  . Dyslipidemia, goal LDL below 70 09/25/2008  . GASTRITIS 09/25/2008  . DIVERTICULOSIS, COLON 09/25/2008  . RECTAL MASS 09/25/2008    Palliative Care Plan    Recommendations/Plan:  Please ensure advanced directive is notarized and a copy is left on the hard chart to scan into EPIC.  Order was placed for the Chaplain to notarize  Goals of Care and Additional Recommendations:  Limitations on Scope of Treatment: Full Scope Treatment  Code Status:  Full code  Prognosis:   Unable to determine.    Discharge Planning:  Home with [place] was discussed with family at bedside.  Thank you for allowing the Palliative Medicine Team to assist in the  care of this patient.  Total time spent:  45 min.     Greater than 50%  of this time was spent counseling and coordinating care related to the above assessment and plan.  Florentina Jenny, PA-C Palliative Medicine  Please contact Palliative MedicineTeam phone at [phone] for questions and concerns between 7 am - 7 pm.   Please see AMION for individual provider pager numbers.

## 2017-04-12 NOTE — Progress Notes (Signed)
ANTICOAGULATION CONSULT NOTE - Follow Up Consult  Pharmacy Consult for Coumadin  Indication: hx recurrent DVT  Allergies  Allergen Reactions  . Metformin And Related Diarrhea    Patient Measurements: Height: 5' 8"  (172.7 cm) Weight: 178 lb 1.6 oz (80.8 kg) (scale b) IBW/kg (Calculated) : 68.4  Vital Signs: Temp: 98.3 F (36.8 C) (08/08 0523) Temp Source: Oral (08/08 0523) BP: 138/75 (08/08 0523) Pulse Rate: 60 (08/08 0523)  Labs:  Recent Labs  04/10/17 0451 04/11/17 0356 04/12/17 0421  HGB 10.2* 12.3* 10.9*  HCT 31.6* 37.1* 33.4*  PLT 226 227 251  LABPROT 16.7* 19.7* 31.2*  INR 1.34 1.65 2.93  CREATININE 1.79* 2.00* 2.14*    Estimated Creatinine Clearance: 39.1 mL/min (A) (by C-G formula based on SCr of 2.14 mg/dL (H)).  Assessment: 51 year old male admitted with CHF exacerbation. He is on chronic anticoagulation for recurrent DVTs with warfarin. His warfarin was held on admission and Coumadin was restarted 8/3.   INR therapeutic and up to 2.98 today: Has had Coumadin 7.5 mg x 2 days then 10 mg x 3 days with lovenox bridge 1.2m/kg/day x 6 days. Predicting an upward trend tomorrow.  Also on Plavix daily for hx stent 11/2016.   No bleeding reported. CBC stable.  Home Coumadin regimen: 5 mg daily except 7.5 mg on Mondays and Saturdays.  Goal of Therapy:  INR: 2-3 Monitor platelets by anticoagulation protocol: Yes   Plan:  Hold Coumadin tonight 8/8 Discontinue Lovenox  Daily PT/INR Monitor CBC and for signs/symptoms of bleeding   Meghan Tiemann L. RKyung Rudd PharmD, [id number] Pharmacy Resident Pager: [phone]

## 2017-04-12 NOTE — Progress Notes (Signed)
Pt take Lantus 37 units daily at home.  Have requested FMTS to address at pt's request.

## 2017-04-12 NOTE — ACP (Advance Care Planning) (Signed)
Held family meeting with the patient, his two adult children ([name] and [place]rian) and his 52 yr old son, Marjory Lies. We discussed the patient's goals:  He wants to be able to wake up and not have pain.  It is important to him to be able to care for himself (perform his own ADLs).    We discussed HCPOA and Advanced Directives.  He appointed his two adult children ([name] and [place]) as co- HCPOAs.    I asked - if he is unable to communicate - how are his children to know when he should be let go peacefully?  Mr. Winstanley commented that if the doctors are telling his family there is nothing else that they can do then his children should allow him to pass on peacefully.  He supports the use of comfort medications at end of life even if they hasten his passing.  He initialed the advanced directive to NOT have life support given if: 1.  He has a condition that can not be cured that will take his life in a short period of time 2.  He has advanced dementia or another condition that has caused loss of ability to think 3.  He is unconscious and the doctors feel he is unlikely to wake up.  He does not want a feeding tube or artificial feeding if he is near end of life.  He mother had one for her last two years of life and her quality of life was very poor.  In retrospect he feels that she should not have had a feeding tube at EOL therefore he does not want one.  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  He commented that his strength comes from acceptance "some people complain - why me?  I say - why not me."    An order was placed for the Chaplain to notarize the advanced directive and give it to the RN to have it scanned into EPIC.  Florentina Jenny, PA-C Palliative Medicine Pager: [phone]

## 2017-04-12 NOTE — Progress Notes (Signed)
Attempted to call insurance for walker approval with # that case management provided. They were asking for a "CPT code" which I am unfamiliar with. Would appreciate CM assistance with this. Thank you.   Smitty Cords, MD [place], PGY-3

## 2017-04-12 NOTE — Progress Notes (Signed)
Family Medicine Teaching Service Daily Progress Note Intern Pager: [phone]  Patient name: [name] Medical record number: [id number] Date of birth: 08-Aug-1966 Age: 51 y.o. Gender: male  Primary Care Provider: [name], MD Consultants: cardio Code Status: full  Overview and major events to date [name] a 51 y.o.malepresenting with chest pain, nausea/vomiting x 3 days. PMH is significant for CHF (EF25-30%), NSTEMI, CKD stage 3, Chrohns, DVTs, colectomy/appendectomy/hernia repair, neoplasm of colon, hypercholesterolemia. Significant events include PIC line 8/2 for CVP pressures.  Transferred to telemetry on 8/6.  Assessment and Plan: [name] a 51 y.o.malepresenting with chest pain, nausea/vomiting x 3 days likely caused by CHF exacerbation. PMH is significant for CHF (EF25-30%), NSTEMI, CKD stage 3, Chrohns, DVTs, colectomy/appendectomy/hernia repair, neoplasm of colon, hypercholesterolemia  Chest Pain/CHF Exacerbation,: Admission BNP 2214.8, Digoxin level <0.2.Troponin negative 2. Chest x-ray showing signs of pulmonary vascular congestion with small right pleural effusion and trace left pleural effusion. Patient likely having CHF exacerbation again versus ACS event. HEART Score of 5.  -51M followed by cardio -EF now 20-25% (8/2 echo) -Vital signs per floor protocol, Continuous cardiac monitoring, Pulse oximetry  -CVP via PIC line per cardio -per cardiology, possible candidate for LVAD -Strict I's and O's, Daily weights - Daily EKG - Reglan  -torsemide (held today due to volume loss via diarrhea) -carvedilol -digoxin -IMDUR 41m daily -prn nitro 474m-renazoline 50055mD -spironolactone -palliative meeting planned for 8/8 -Bidil TID -telemetry  Nausea, Vomiting: Likely due to CHF gastroperesis vs viral gastro enteritis/DKA related edema -one episode of emesis yesterday -phenrgan or zophran prn   T2DM- takes 37  units of Lantus daily @home . Last hemoglobin A1c in 02/09/2017; 7.5, glucosuria >500 -Lantus 2 (8/3) -SSI sensitive -CBGs at goal on sliding scale, 5-8 units aspart -cardio suggests considering empagiflozin  Diabetic Neuropathy -increased to 600m3mD gabapentin on 8/6  CKD stage 3: Baseline creatinine appears to be around 1.32- 2.2 (difficult to determine as he has had variable creatinine this year) however last recorded creatinine prior to this admission was 1.46 in June 2018.  - most recent creatinine 2.0 -Daily BMP -per cards, avoid nephrotoxic agents -spirinolactone  Hypertension:Normotensive -Continue home Coreg, Imdur, digoxin per cardio -Holding home torsemide -per cardiology, hold lasix and ACE/ARB/ARNI w/ elevated Cr  Significant GIhistory: History of Crohn's disease, colectomy, diverticulosis, rectal polyp, and gastroparesis (secondary to diabetes). Hx of chronic nausea and vomiting.  -Continue home Reglan -Phenergan prn -Abdominal imaging as above  Diarrhea -reported started evening 8/6, 5-6 watery stool w/o signs of blood -afebrile, but is on tylenol..no abx and home history of chronic diarrhea 2/2 crohns makes cdiff less likely, but white count did increase suddenly to 17 which makes infectious cause more likely -quick c.diff. And gastoenteric panel - enteric precautions -patient thinks it is connected to drinking "unfiltered water" at hospital, only drinks bottle water at home.  History of recurrent DVTs: On Coumadin at home -Coumadin per pharmacy bridging from lovenox -plavix  Hyperlipidemia:  - Continue home atorvastatin  Pain -tylenol prn  FEN/GI: renal/carb diet Prophylaxis: coumadin per pharmacy  Disposition:home pending Cardiology/FM clearance    Subjective:  Leg pain improved on increased gabapentin but had new onset (for hospital stay) diarrhea 5-6 times non-bloody that he thinks is connected to not drinking bottles water.   No  vomiting but some nausea.  Objective: Temp:  [97.2 F (36.2 C)-98.8 F (37.1 C)] 98.3 F (36.8 C) (08/08 0523) Pulse Rate:  [60-93] 60 (08/08 0523) Resp:  [  18] 18 (08/08 0523) BP: (110-138)/(75-83) 138/75 (08/08 0523) SpO2:  [98 %-100 %] 100 % (08/08 0523) Weight:  [178 lb 1.6 oz (80.8 kg)] 178 lb 1.6 oz (80.8 kg) (08/08 0523) Physical Exam: General: awake, NAD Cardiovascular: Regular rhythm but tachy at about 478-295, systolic murmur Respiratory: normal work of breathing, no obvious rhonchi Gastrointestinal: soft belly to exam,no tenderness expressed to palpation MSK: no pitting edema on lower extremities bilaterally,no pain in feet  Laboratory:  Recent Labs Lab 04/10/17 0451 04/11/17 0356 04/12/17 0421  WBC 5.5 17.4* 8.2  HGB 10.2* 12.3* 10.9*  HCT 31.6* 37.1* 33.4*  PLT 226 227 251    Recent Labs Lab 04/05/17 2235  04/10/17 0451 04/11/17 0356 04/12/17 0421  NA  --   < > 136 137 134*  K  --   < > 3.7 4.1 3.7  CL  --   < > 100* 101 100*  CO2  --   < > 29 26 25   BUN  --   < > 30* 37* 41*  CREATININE  --   < > 1.79* 2.00* 2.14*  CALCIUM  --   < > 8.1* 8.7* 8.2*  PROT 7.4  --   --   --   --   BILITOT 1.5*  --   --   --   --   ALKPHOS 130*  --   --   --   --   ALT 19  --   --   --   --   AST 25  --   --   --   --   GLUCOSE  --   < > 243* 157* 150*  < > = values in this interval not displayed.    Imaging/Diagnostic Tests: Dg Chest 2 View  Result Date: 04/05/2017 CLINICAL DATA:  Four days of sharp sub sternal non radiating chest discomfort. Two days of nausea and vomiting with inability to take medications. History of diabetes, hypertension, ischemic cardiomyopathy with dysrhythmia is, previous MI, nonsmoker. EXAM: CHEST  2 VIEW COMPARISON:  PA and lateral chest x-ray of February 11, 2017 FINDINGS: The lungs are mildly hypoinflated. There is a new small right pleural effusion as well as trace left pleural effusion. The cardiac silhouette remains enlarged. The pulmonary  vascularity is more engorged and there is cephalization of the vascular pattern. The ICD is in stable position. The bony thorax is unremarkable. The gas pattern in the upper abdomen is nonspecific. IMPRESSION: CHF with pulmonary vascular congestion and new small right and trace left pleural effusions. The appearance of the chest has deteriorated since the previous study. Electronically Signed   By: David  Martinique M.D.   On: 04/05/2017 12:15   Dg Abd 2 Views  Result Date: 04/05/2017 CLINICAL DATA:  Nausea and vomiting. EXAM: ABDOMEN - 2 VIEW COMPARISON:  11/17/2011 abdominal radiograph. 02/11/2008 CT abdomen and pelvis. FINDINGS: There is no evidence of intraperitoneal free air. Scattered colonic gas is present (including in sigmoid colon which courses into the right lower quadrant). No dilated loops of bowel are seen to suggest obstruction. An ICD is partially visualized. Lung findings, including a small right pleural effusion, are more fully evaluated on today's earlier chest radiographs. IMPRESSION: Nonobstructed bowel gas pattern. Electronically Signed   By: Logan Bores M.D.   On: 04/05/2017 17:04   US Abdomen Limited Ruq  Result Date: 04/06/2017 CLINICAL DATA:  Nausea and vomiting for 3 days. History of CHF, Crohn's disease, hypertension, diabetes. Previous colectomy, appendectomy, hernia  repair. EXAM: ULTRASOUND ABDOMEN LIMITED RIGHT UPPER QUADRANT COMPARISON:  CT abdomen and pelvis 02/11/2008. Abdominal radiographs 04/05/2017 FINDINGS: Gallbladder: No gallstones or wall thickening visualized. No sonographic Murphy sign noted by sonographer. Common bile duct: Diameter: 4.1 mm, normal Liver: Heterogeneous increased parenchymal echotexture suggesting fatty infiltration of the liver. No focal lesions identified. Limited visualization of the liver due to rib shadowing. IMPRESSION: No evidence of cholelithiasis or cholecystitis. Probable fatty infiltration of the liver. Electronically Signed   By: Lucienne Capers M.D.   On: 04/06/2017 23:05     Bonnita Hollow, MD 04/12/2017, 6:57 AM PGY-1, [place] Intern pager: [phone], text pages welcome

## 2017-04-13 ENCOUNTER — Telehealth (HOSPITAL_COMMUNITY): Payer: Self-pay | Admitting: *Deleted

## 2017-04-13 LAB — BASIC METABOLIC PANEL
ANION GAP: 9 (ref 5–15)
BUN: 44 mg/dL — AB (ref 6–20)
CALCIUM: 8.3 mg/dL — AB (ref 8.9–10.3)
CO2: 25 mmol/L (ref 22–32)
Chloride: 100 mmol/L — ABNORMAL LOW (ref 101–111)
Creatinine, Ser: 2.11 mg/dL — ABNORMAL HIGH (ref 0.61–1.24)
GFR calc Af Amer: 40 mL/min — ABNORMAL LOW (ref >60)
GFR, EST NON AFRICAN AMERICAN: 34 mL/min — AB (ref >60)
Glucose, Bld: 184 mg/dL — ABNORMAL HIGH (ref 65–99)
POTASSIUM: 3.6 mmol/L (ref 3.5–5.1)
SODIUM: 134 mmol/L — AB (ref 135–145)

## 2017-04-13 LAB — CBC
HCT: 32.8 % — ABNORMAL LOW (ref 39.0–52.0)
Hemoglobin: 10.9 g/dL — ABNORMAL LOW (ref 13.0–17.0)
MCH: 27 pg (ref 26.0–34.0)
MCHC: 33.2 g/dL (ref 30.0–36.0)
MCV: 81.2 fL (ref 78.0–100.0)
PLATELETS: 257 10*3/uL (ref 150–400)
RBC: 4.04 MIL/uL — AB (ref 4.22–5.81)
RDW: 14.8 % (ref 11.5–15.5)
WBC: 7.1 10*3/uL (ref 4.0–10.5)

## 2017-04-13 LAB — GASTROINTESTINAL PANEL BY PCR, STOOL (REPLACES STOOL CULTURE)

## 2017-04-13 LAB — GLUCOSE, CAPILLARY
GLUCOSE-CAPILLARY: 193 mg/dL — AB (ref 65–99)
Glucose-Capillary: 245 mg/dL — ABNORMAL HIGH (ref 65–99)

## 2017-04-13 LAB — PROTIME-INR
INR: 2.07
PROTHROMBIN TIME: 23.7 s — AB (ref 11.4–15.2)

## 2017-04-13 MED ORDER — GABAPENTIN 300 MG PO CAPS: 600.0000 mg | ORAL_CAPSULE | Freq: Two times a day (BID) | ORAL | 1 refills | Status: DC

## 2017-04-13 MED ORDER — TORSEMIDE 20 MG PO TABS: 80.0000 mg | ORAL_TABLET | Freq: Every day | ORAL | 0 refills | Status: DC

## 2017-04-13 MED ORDER — TORSEMIDE 20 MG PO TABS: 40.0000 mg | ORAL_TABLET | Freq: Every evening | ORAL | 0 refills | Status: DC

## 2017-04-13 MED ORDER — ISOSORB DINITRATE-HYDRALAZINE 20-37.5 MG PO TABS: 1.0000 | ORAL_TABLET | Freq: Three times a day (TID) | ORAL | 0 refills | Status: DC

## 2017-04-13 MED ORDER — INSULIN GLARGINE 100 UNIT/ML ~~LOC~~ SOLN: 2.0000 [IU] | Freq: Every day | SUBCUTANEOUS | 11 refills | Status: DC

## 2017-04-13 MED ORDER — SPIRONOLACTONE 25 MG PO TABS: 12.5000 mg | ORAL_TABLET | Freq: Every day | ORAL | 0 refills | Status: DC

## 2017-04-13 MED ORDER — INSULIN GLARGINE 100 UNIT/ML ~~LOC~~ SOLN: 10.0000 [IU] | Freq: Every day | SUBCUTANEOUS | 11 refills | Status: DC

## 2017-04-13 NOTE — Progress Notes (Addendum)
Paged MD again requesting discharge orders. Pt's ride has been waiting bedside for over 3 hours.  ____________________________________________  Dr. Grandville Silos returned page stating that they are in the process of completing discharge paperwork, and it should be done soon.

## 2017-04-13 NOTE — Progress Notes (Signed)
  He is clear for home from HF perpsective.   HF meds for home  Torsemide 80 mg q am and 40 mg q pm Bidil 1 tab TID Coreg 3.125 mg BID Spiro 12.5 mg daily Bidil 1 tab TID Atorvastatin 80 mg dail Digoxin 0.125 mg daily Coumadin regimen same as PTA.   He is scheduled for labs next week, and an office visit the next.   He will need coumadin clinic follow up next week.    Legrand Como [address] [place], PA-C 04/13/2017 9:22 AM

## 2017-04-13 NOTE — Telephone Encounter (Signed)
Pt has made no attempts to contact cardiac rehab. Pt last exercised on 02/15/17.  Pt will be discharged from cardiac rehab with the completion of 7 exercise sessions. Cherre Huger, BSN Cardiac and Pulmonary Rehab Nurse Navigator '

## 2017-04-13 NOTE — Progress Notes (Signed)
Spole to MD r/t pt being discharged to notify her that pt's daughter is here and waiting to take pt home.  Am waiting for discharge orders.

## 2017-04-13 NOTE — Progress Notes (Signed)
CARDIAC REHAB PHASE I   PRE:  Rate/Rhythm: 87 SR pacing with PVCs    BP: sitting 117/78    SaO2:   MODE:  Ambulation: 460 ft   POST:  Rate/Rhythm: 93     BP: sitting 135/78     SaO2: 100 RA  Tolerated well, feeling good. No RW needed, no assist. Tired toward end, esp hips. Reviewed low sodium, daily wts, walking daily and CRPII. Pt is wanting to try CRPII again. Will send referral to [facility]. Encouraged f/u with HF clinic. [phone]   Darrick Meigs CES, ACSM 04/13/2017 10:50 AM

## 2017-04-13 NOTE — Progress Notes (Signed)
Orders received for pt discharge.  Discharge summary printed and reviewed with pt.  Explained medication regimen, and pt had no further questions at this time. PICC removed without complication and site remains clean, dry, intact.  Telemetry removed.  Pt in stable condition and awaiting transport.

## 2017-04-13 NOTE — Progress Notes (Signed)
ANTICOAGULATION CONSULT NOTE - Follow Up Consult  Pharmacy Consult for Coumadin  Indication: hx recurrent DVT  Allergies  Allergen Reactions  . Metformin And Related Diarrhea    Patient Measurements: Height: 5' 8"  (172.7 cm) Weight: 178 lb 3.2 oz (80.8 kg) IBW/kg (Calculated) : 68.4  Vital Signs: Temp: 98 F (36.7 C) (08/09 0458) Temp Source: Oral (08/09 0458) BP: 111/79 (08/09 0458) Pulse Rate: 87 (08/09 0458)  Labs:  Recent Labs  04/11/17 0356 04/12/17 0421 04/12/17 1356 04/13/17 0400  HGB 12.3* 10.9*  --  10.9*  HCT 37.1* 33.4*  --  32.8*  PLT 227 251  --  257  LABPROT 19.7* 31.2* 30.6* 23.7*  INR 1.65 2.93 2.87 2.07  CREATININE 2.00* 2.14*  --  2.11*    Estimated Creatinine Clearance: 39.6 mL/min (A) (by C-G formula based on SCr of 2.11 mg/dL (H)).  Assessment: 51 year old male admitted with CHF exacerbation. He is on chronic anticoagulation for recurrent DVTs with warfarin. His warfarin was held on admission and Coumadin was restarted 8/3 with lovenox bridge until 8/7.   -INR therapeutic at 2.07 today   Also on Plavix daily for hx stent 11/2016.   No bleeding reported. CBC stable.   Home Coumadin regimen: 5 mg daily except 7.5 mg on Mondays and Saturdays.  Goal of Therapy:  INR: 2-3 Monitor platelets by anticoagulation protocol: Yes   Plan:   Recommend to be discharged today on PTA warfarin regimen: 5 mg daily except 7.5 mg on Mondays and Saturdays. Follow-up in [facility] anticoagulation clinic closely as he has a history of highly fluctuating INRs: recommend a clinic visit on Monday 8/13 or Tuesday 8/14.    Amberleigh Gerken L. Kyung Rudd, PharmD, [place] PGY1 Pharmacy Resident Pager: [phone]

## 2017-04-13 NOTE — Progress Notes (Signed)
   Advanced Directive notarized.  Copy charted; original w/ patient.

## 2017-04-14 ENCOUNTER — Telehealth: Payer: Self-pay | Admitting: *Deleted

## 2017-04-14 ENCOUNTER — Encounter (HOSPITAL_COMMUNITY): Payer: BLUE CROSS/BLUE SHIELD

## 2017-04-14 ENCOUNTER — Encounter (HOSPITAL_COMMUNITY)
Admission: RE | Admit: 2017-04-14 | Payer: BLUE CROSS/BLUE SHIELD | Source: Ambulatory Visit | Attending: Cardiology | Admitting: Cardiology

## 2017-04-14 NOTE — Progress Notes (Signed)
Next ICM transmission scheduled for 04/24/2017.

## 2017-04-14 NOTE — Telephone Encounter (Signed)
Charleen Kirks, RN with [facility] called stating physical therapy will be going out to patient's home this Sunday to start care.  Derl Barrow, RN

## 2017-04-17 ENCOUNTER — Ambulatory Visit (INDEPENDENT_AMBULATORY_CARE_PROVIDER_SITE_OTHER): Payer: BLUE CROSS/BLUE SHIELD | Admitting: Pharmacist

## 2017-04-17 ENCOUNTER — Encounter (HOSPITAL_COMMUNITY): Payer: BLUE CROSS/BLUE SHIELD

## 2017-04-17 DIAGNOSIS — I82499 Acute embolism and thrombosis of other specified deep vein of unspecified lower extremity: Secondary | ICD-10-CM | POA: Diagnosis not present

## 2017-04-17 DIAGNOSIS — Z5181 Encounter for therapeutic drug level monitoring: Secondary | ICD-10-CM

## 2017-04-17 LAB — POCT INR: INR: 1.6

## 2017-04-18 ENCOUNTER — Encounter: Payer: Self-pay | Admitting: Internal Medicine

## 2017-04-18 ENCOUNTER — Ambulatory Visit (INDEPENDENT_AMBULATORY_CARE_PROVIDER_SITE_OTHER): Payer: BLUE CROSS/BLUE SHIELD | Admitting: Internal Medicine

## 2017-04-18 ENCOUNTER — Telehealth (HOSPITAL_COMMUNITY): Payer: Self-pay

## 2017-04-18 VITALS — BP 145/80 | HR 106 | Temp 97.9°F | Ht 68.0 in | Wt 184.0 lb

## 2017-04-18 DIAGNOSIS — E118 Type 2 diabetes mellitus with unspecified complications: Secondary | ICD-10-CM | POA: Diagnosis not present

## 2017-04-18 DIAGNOSIS — Z794 Long term (current) use of insulin: Secondary | ICD-10-CM

## 2017-04-18 DIAGNOSIS — I5022 Chronic systolic (congestive) heart failure: Secondary | ICD-10-CM | POA: Diagnosis not present

## 2017-04-18 LAB — GLUCOSE, POCT (MANUAL RESULT ENTRY): POC Glucose: 195 mg/dl — AB (ref 70–99)

## 2017-04-18 MED ORDER — EMPAGLIFLOZIN 10 MG PO TABS: 10.0000 mg | ORAL_TABLET | Freq: Every day | ORAL | 3 refills | Status: DC

## 2017-04-18 MED ORDER — INSULIN GLARGINE 100 UNIT/ML ~~LOC~~ SOLN: 35.0000 [IU] | Freq: Every day | SUBCUTANEOUS | 11 refills | Status: DC

## 2017-04-18 NOTE — Patient Instructions (Addendum)
[name],  I am glad you are feeling better.  Please restart lantus 20 U with jardiance 10 mg daily. See Dr. Valentina Lucks for the no-stick glucose monitor. He is our pharmacist who is trialing our patients on these devices.  I will be on the lookout for your labs tomorrow.   We will follow-up about the Ophthalmology referral.   See me back once you have been able to monitoring your blood sugars for a week or two.  Best, Dr. Ola Spurr  Empagliflozin oral tablets What is this medicine? EMPAGLIFLOZIN (EM pa gli FLOE zin) helps to treat type 2 diabetes. It helps to control blood sugar. This drug may also reduce the risk of heart attack or stroke if you have type 2 diabetes and risk factors for heart disease. Treatment is combined with diet and exercise. This medicine may be used for other purposes; ask your health care provider or pharmacist if you have questions. COMMON BRAND NAME(S): JARDIANCE What should I tell my health care provider before I take this medicine? They need to know if you have any of these conditions: -dehydration -diabetic ketoacidosis -diet low in salt -eating less due to illness, surgery, dieting, or any other reason -having surgery -high cholesterol -high levels of potassium in the blood -history of pancreatitis or pancreas problems -history of yeast infection of the penis or vagina -if you often drink alcohol -infections in the bladder, kidneys, or urinary tract -kidney disease -liver disease -low blood pressure -on hemodialysis -problems urinating -type 1 diabetes -uncircumcised male -an unusual or allergic reaction to empagliflozin, other medicines, foods, dyes, or preservatives -pregnant or trying to get pregnant -breast-feeding How should I use this medicine? Take this medicine by mouth with a glass of water. Follow the directions on the prescription label. Take it in the morning, with or without food. Take your dose at the same time each day. Do not  take more often than directed. Do not stop taking except on your doctor's advice. Talk to your pediatrician regarding the use of this medicine in children. Special care may be needed. Overdosage: If you think you have taken too much of this medicine contact a poison control center or emergency room at once. NOTE: This medicine is only for you. Do not share this medicine with others. What if I miss a dose? If you miss a dose, take it as soon as you can. If it is almost time for your next dose, take only that dose. Do not take double or extra doses. What may interact with this medicine? Do not take this medicine with any of the following medications: -gatifloxacin This medicine may also interact with the following medications: -alcohol -certain medicines for blood pressure, heart disease -diuretics This list may not describe all possible interactions. Give your health care provider a list of all the medicines, herbs, non-prescription drugs, or dietary supplements you use. Also tell them if you smoke, drink alcohol, or use illegal drugs. Some items may interact with your medicine. What should I watch for while using this medicine? Visit your doctor or health care professional for regular checks on your progress. This medicine can cause a serious condition in which there is too much acid in the blood. If you develop nausea, vomiting, stomach pain, unusual tiredness, or breathing problems, stop taking this medicine and call your doctor right away. If possible, use a ketone dipstick to check for ketones in your urine. A test called the HbA1C (A1C) will be monitored. This is a simple  blood test. It measures your blood sugar control over the last 2 to 3 months. You will receive this test every 3 to 6 months. Learn how to check your blood sugar. Learn the symptoms of low and high blood sugar and how to manage them. Always carry a quick-source of sugar with you in case you have symptoms of low blood sugar.  Examples include hard sugar candy or glucose tablets. Make sure others know that you can choke if you eat or drink when you develop serious symptoms of low blood sugar, such as seizures or unconsciousness. They must get medical help at once. Tell your doctor or health care professional if you have high blood sugar. You might need to change the dose of your medicine. If you are sick or exercising more than usual, you might need to change the dose of your medicine. Do not skip meals. Ask your doctor or health care professional if you should avoid alcohol. Many nonprescription cough and cold products contain sugar or alcohol. These can affect blood sugar. Wear a medical ID bracelet or chain, and carry a card that describes your disease and details of your medicine and dosage times. What side effects may I notice from receiving this medicine? Side effects that you should report to your doctor or health care professional as soon as possible: -allergic reactions like skin rash, itching or hives, swelling of the face, lips, or tongue -breathing problems -dizziness -fast or irregular heartbeat -feeling faint or lightheaded, falls -muscle weakness -nausea, vomiting, unusual stomach upset or pain -signs and symptoms of low blood sugar such as feeling anxious, confusion, dizziness, increased hunger, unusually weak or tired, sweating, shakiness, cold, irritable, headache, blurred vision, fast heartbeat, loss of consciousness -signs and symptoms of a urinary tract infection, such as fever, chills, a burning feeling when urinating, blood in the urine, back pain -trouble passing urine or change in the amount of urine, including an urgent need to urinate more often, in larger amounts, or at night -penile discharge, itching, or pain in men -unusual tiredness -vaginal discharge, itching, or odor in women Side effects that usually do not require medical attention (report to your doctor or health care professional if  they continue or are bothersome): -joint pain -mild increase in urination -thirsty This list may not describe all possible side effects. Call your doctor for medical advice about side effects. You may report side effects to FDA at 1-800-FDA-1088. Where should I keep my medicine? Keep out of the reach of children. Store at room temperature between 20 and 25 degrees C (68 and 77 degrees F). Throw away any unused medicine after the expiration date. NOTE: This sheet is a summary. It may not cover all possible information. If you have questions about this medicine, talk to your doctor, pharmacist, or health care provider.  2018 Elsevier/Gold Standard (2015-09-24 11:46:10)

## 2017-04-18 NOTE — Telephone Encounter (Signed)
Patient insurance is active and benefits verified. Patient insurance is [employer] - $30.00 co-pay, no deductible, out of pocket $2000/$2000, 30% co-insurance and no pre-authorization. Passport/reference [id number].  Patient will be contacted and scheduled after their follow up appointment with the cardiologist on 04/25/17, upon review by [facility] RN navigator.

## 2017-04-19 ENCOUNTER — Encounter (HOSPITAL_COMMUNITY): Payer: BLUE CROSS/BLUE SHIELD

## 2017-04-19 ENCOUNTER — Ambulatory Visit (HOSPITAL_COMMUNITY)
Admission: RE | Admit: 2017-04-19 | Discharge: 2017-04-19 | Disposition: A | Discharge status: Home / Self Care | Payer: BLUE CROSS/BLUE SHIELD | Source: Ambulatory Visit | Attending: Internal Medicine | Admitting: Internal Medicine

## 2017-04-19 ENCOUNTER — Other Ambulatory Visit (HOSPITAL_COMMUNITY): Payer: Self-pay | Admitting: *Deleted

## 2017-04-19 DIAGNOSIS — I5022 Chronic systolic (congestive) heart failure: Secondary | ICD-10-CM | POA: Insufficient documentation

## 2017-04-19 DIAGNOSIS — I5042 Chronic combined systolic (congestive) and diastolic (congestive) heart failure: Secondary | ICD-10-CM

## 2017-04-19 LAB — BASIC METABOLIC PANEL
ANION GAP: 11 (ref 5–15)
BUN: 39 mg/dL — ABNORMAL HIGH (ref 6–20)
CO2: 22 mmol/L (ref 22–32)
CREATININE: 1.71 mg/dL — AB (ref 0.61–1.24)
Calcium: 8 mg/dL — ABNORMAL LOW (ref 8.9–10.3)
Chloride: 104 mmol/L (ref 101–111)
GFR, EST AFRICAN AMERICAN: 51 mL/min — AB (ref >60)
GFR, EST NON AFRICAN AMERICAN: 44 mL/min — AB (ref >60)
Glucose, Bld: 238 mg/dL — ABNORMAL HIGH (ref 65–99)
Potassium: 3.7 mmol/L (ref 3.5–5.1)
Sodium: 137 mmol/L (ref 135–145)
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
## 2017-04-19 NOTE — Assessment & Plan Note (Signed)
-   Weight increased by 6 lbs per our clinic scale, though patient denies abdominal discomfort or SOB and states his scale was reading 178 lbs yesterday. Asked patient to weigh himself at home and let us know if number also elevated. He typically sends an ICD transmission to CHF clinic if weight elevated.  - To see Cardiology next week for consideration of LVAD - Due for labs tomorrow, per Cardiology. Patient aware.

## 2017-04-19 NOTE — Addendum Note (Signed)
Encounter addended by: Harvie [name], CMA on: 04/19/2017  1:01 PM<BR>    Actions taken: Visit diagnoses modified, Order list changed, Diagnosis association updated
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
## 2017-04-20 MED ORDER — WARFARIN SODIUM 5 MG PO TABS: ORAL_TABLET | ORAL | 1 refills | Status: DC

## 2017-04-20 NOTE — Addendum Note (Signed)
Addended by: Campbell Riches on: 04/20/2017 07:57 AM   Modules accepted: Orders

## 2017-04-21 ENCOUNTER — Encounter (HOSPITAL_COMMUNITY): Payer: BLUE CROSS/BLUE SHIELD

## 2017-04-24 ENCOUNTER — Ambulatory Visit (INDEPENDENT_AMBULATORY_CARE_PROVIDER_SITE_OTHER): Payer: BLUE CROSS/BLUE SHIELD | Admitting: Pharmacist

## 2017-04-24 ENCOUNTER — Telehealth: Payer: Self-pay

## 2017-04-24 ENCOUNTER — Encounter: Payer: Self-pay | Admitting: Pharmacist

## 2017-04-24 ENCOUNTER — Ambulatory Visit (INDEPENDENT_AMBULATORY_CARE_PROVIDER_SITE_OTHER): Payer: BLUE CROSS/BLUE SHIELD

## 2017-04-24 ENCOUNTER — Encounter (HOSPITAL_COMMUNITY): Payer: BLUE CROSS/BLUE SHIELD

## 2017-04-24 DIAGNOSIS — Z95810 Presence of automatic (implantable) cardiac defibrillator: Secondary | ICD-10-CM

## 2017-04-24 DIAGNOSIS — Z794 Long term (current) use of insulin: Secondary | ICD-10-CM

## 2017-04-24 DIAGNOSIS — E118 Type 2 diabetes mellitus with unspecified complications: Secondary | ICD-10-CM | POA: Diagnosis not present

## 2017-04-24 DIAGNOSIS — I5042 Chronic combined systolic (congestive) and diastolic (congestive) heart failure: Secondary | ICD-10-CM | POA: Diagnosis not present

## 2017-04-24 MED ORDER — GLUCOSE BLOOD VI STRP: ORAL_STRIP | 12 refills | Status: DC

## 2017-04-24 NOTE — Telephone Encounter (Signed)
Remote ICM transmission received.  Attempted patient call and no answer 

## 2017-04-24 NOTE — Assessment & Plan Note (Signed)
Diabetes longstanding currently controlled, with most recent A1c 7.5%. Blood sugar check in appointment was 211. Patient denies eating breakfast prior to appoint; previous night's dinner may be contributing to elevated BG. Patient denies hypoglycemic events and is able to verbalize appropriate hypoglycemia management plan. Patient reports adherence with medication. Unable to make concrete adjustments due to lack of home BG readings.  -Provided patient with a Contour Next EZ meter (thought to be covered by [facility]) and sent a prescription for strips to his pharmacy. Instructed patient to check sugars twice a day -Continued Lantus insulin. Patient was instructed to increase dose of lantus by 1 unit a day until he is seeing fasting BG of 100-150.  -Continue taking Jardiance. Considering increasing dose at next visit.  -Next A1C anticipated September 2018.  Unsure if CGM monitoring will be appropriate for this patient.   If SGLT2 therapy improves glycemic control we may be able to minimize CBG checks and his blood sugar readings may be at goal without use of additional monitoring.   Reassess insulin dose and CBGs after use of Jardiance at higher dose (likely to be increased at next visit).

## 2017-04-24 NOTE — Progress Notes (Signed)
EPIC Encounter for ICM Monitoring  Patient Name: [name] is a 51 y.o. male Date: 04/24/2017 Primary Care Physican: [name], MD Primary Cardiologist: [facility] Electrophysiologist: Caryl Comes Dry Weight:  [name] of last weight      Attempted call to patient and unable to reach.   Transmission reviewed.   Hospitalized 04/05/17 to 04/13/2017 for CHF and vomiting.   Thoracic impedance above baseline suggesting dryness since 04/10/2017.  Impedance was decreased suggesting fluid accumulation from 04/01/2017 to 04/07/2017 which correlates with hospitalization.  Prescribed dosage:Torsemide 20 mg 4tablets (80 mg total)daily. Extra torsemide if weight increases by 3 pounds in one day or 5 pounds overnight per HF chart note 02/21/2017  Recommendations: NONE - Unable to reach patient   Follow-up plan: ICM clinic phone appointment on 05/25/2017.  Office appointment scheduled 04/25/2017 with Dr. Aundra Dubin.  Copy of ICM check sent to device physician.   3 month ICM trend: 04/24/2017   1 Year ICM trend:      Rosalene Billings, RN 04/24/2017 4:41 PM

## 2017-04-24 NOTE — Telephone Encounter (Signed)
LVM for pt to call back regarding ATP episode from 04/23/17
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
## 2017-04-24 NOTE — Patient Instructions (Addendum)
Thank you for coming to see [place] today!  1. Use your blood sugar meter twice a day, once in the morning and once after your biggest meal of the day. We've sent a prescription for strips to your pharmacy  2. Increase your Lantus by 1 unit a day until you are seeing blood sugar readings ~100-150. At that point, that will be your daily dose of insulin.   3. Continue taking your Jardiance. Make sure to watch for signs of UTI (pain with peeing, increased need to pee) and signs of a yeast infection (itchiness), as these are possible side effects of Jardiance. If you are very dehydrated, whether from sickness or anything else, stop taking your Jardiance. Restart after the dehydration has been resolved.   Come back and see [place] at [facility] in 2 weeks

## 2017-04-25 ENCOUNTER — Ambulatory Visit (HOSPITAL_COMMUNITY)
Admission: RE | Admit: 2017-04-25 | Discharge: 2017-04-25 | Disposition: A | Discharge status: Home / Self Care | Payer: BLUE CROSS/BLUE SHIELD | Source: Ambulatory Visit | Attending: Cardiology | Admitting: Cardiology

## 2017-04-25 ENCOUNTER — Encounter (HOSPITAL_COMMUNITY): Payer: Self-pay | Admitting: *Deleted

## 2017-04-25 ENCOUNTER — Other Ambulatory Visit (HOSPITAL_COMMUNITY): Payer: Self-pay | Admitting: *Deleted

## 2017-04-25 ENCOUNTER — Encounter: Payer: Self-pay | Admitting: Unknown Physician Specialty

## 2017-04-25 ENCOUNTER — Encounter (HOSPITAL_COMMUNITY): Payer: Self-pay | Admitting: Cardiology

## 2017-04-25 VITALS — BP 131/75 | HR 82 | Wt 181.8 lb

## 2017-04-25 DIAGNOSIS — I130 Hypertensive heart and chronic kidney disease with heart failure and stage 1 through stage 4 chronic kidney disease, or unspecified chronic kidney disease: Secondary | ICD-10-CM | POA: Diagnosis not present

## 2017-04-25 DIAGNOSIS — E1143 Type 2 diabetes mellitus with diabetic autonomic (poly)neuropathy: Secondary | ICD-10-CM | POA: Diagnosis not present

## 2017-04-25 DIAGNOSIS — Z79899 Other long term (current) drug therapy: Secondary | ICD-10-CM | POA: Diagnosis not present

## 2017-04-25 DIAGNOSIS — K3184 Gastroparesis: Secondary | ICD-10-CM | POA: Insufficient documentation

## 2017-04-25 DIAGNOSIS — Z7902 Long term (current) use of antithrombotics/antiplatelets: Secondary | ICD-10-CM | POA: Diagnosis not present

## 2017-04-25 DIAGNOSIS — N183 Chronic kidney disease, stage 3 unspecified: Secondary | ICD-10-CM

## 2017-04-25 DIAGNOSIS — I2510 Atherosclerotic heart disease of native coronary artery without angina pectoris: Secondary | ICD-10-CM | POA: Diagnosis not present

## 2017-04-25 DIAGNOSIS — Z7901 Long term (current) use of anticoagulants: Secondary | ICD-10-CM | POA: Insufficient documentation

## 2017-04-25 DIAGNOSIS — Z955 Presence of coronary angioplasty implant and graft: Secondary | ICD-10-CM | POA: Insufficient documentation

## 2017-04-25 DIAGNOSIS — I5042 Chronic combined systolic (congestive) and diastolic (congestive) heart failure: Secondary | ICD-10-CM | POA: Diagnosis not present

## 2017-04-25 DIAGNOSIS — I5022 Chronic systolic (congestive) heart failure: Secondary | ICD-10-CM | POA: Insufficient documentation

## 2017-04-25 DIAGNOSIS — Z8371 Family history of colonic polyps: Secondary | ICD-10-CM | POA: Diagnosis not present

## 2017-04-25 DIAGNOSIS — K219 Gastro-esophageal reflux disease without esophagitis: Secondary | ICD-10-CM | POA: Insufficient documentation

## 2017-04-25 DIAGNOSIS — E1122 Type 2 diabetes mellitus with diabetic chronic kidney disease: Secondary | ICD-10-CM | POA: Diagnosis not present

## 2017-04-25 DIAGNOSIS — Z8249 Family history of ischemic heart disease and other diseases of the circulatory system: Secondary | ICD-10-CM | POA: Diagnosis not present

## 2017-04-25 DIAGNOSIS — Z794 Long term (current) use of insulin: Secondary | ICD-10-CM | POA: Insufficient documentation

## 2017-04-25 DIAGNOSIS — E785 Hyperlipidemia, unspecified: Secondary | ICD-10-CM | POA: Diagnosis not present

## 2017-04-25 DIAGNOSIS — I509 Heart failure, unspecified: Secondary | ICD-10-CM

## 2017-04-25 DIAGNOSIS — Z833 Family history of diabetes mellitus: Secondary | ICD-10-CM | POA: Insufficient documentation

## 2017-04-25 DIAGNOSIS — Z9049 Acquired absence of other specified parts of digestive tract: Secondary | ICD-10-CM | POA: Insufficient documentation

## 2017-04-25 DIAGNOSIS — Z86718 Personal history of other venous thrombosis and embolism: Secondary | ICD-10-CM | POA: Diagnosis not present

## 2017-04-25 DIAGNOSIS — I252 Old myocardial infarction: Secondary | ICD-10-CM | POA: Insufficient documentation

## 2017-04-25 LAB — LIPID PANEL
CHOLESTEROL: 149 mg/dL (ref 0–200)
HDL: 51 mg/dL (ref >40)
LDL Cholesterol: 76 mg/dL (ref 0–99)
TRIGLYCERIDES: 108 mg/dL (ref <150)
Total CHOL/HDL Ratio: 2.9 RATIO
VLDL: 22 mg/dL (ref 0–40)

## 2017-04-25 LAB — BASIC METABOLIC PANEL WITH GFR
Anion gap: 11 (ref 5–15)
BUN: 75 mg/dL — ABNORMAL HIGH (ref 6–20)
CO2: 29 mmol/L (ref 22–32)
Calcium: 8 mg/dL — ABNORMAL LOW (ref 8.9–10.3)
Chloride: 98 mmol/L — ABNORMAL LOW (ref 101–111)
Creatinine, Ser: 2.18 mg/dL — ABNORMAL HIGH (ref 0.61–1.24)
GFR calc Af Amer: 38 mL/min — ABNORMAL LOW (ref >60)
GFR calc non Af Amer: 33 mL/min — ABNORMAL LOW (ref >60)
Glucose, Bld: 251 mg/dL — ABNORMAL HIGH (ref 65–99)
Potassium: 3.7 mmol/L (ref 3.5–5.1)
Sodium: 138 mmol/L (ref 135–145)

## 2017-04-25 LAB — DIGOXIN LEVEL: Digoxin Level: 0.8 ng/mL (ref 0.8–2.0)

## 2017-04-25 MED ORDER — ISOSORB DINITRATE-HYDRALAZINE 20-37.5 MG PO TABS: 1.5000 | ORAL_TABLET | Freq: Three times a day (TID) | ORAL | 3 refills | Status: DC

## 2017-04-25 NOTE — Patient Instructions (Signed)
Increase Bidil to 1 and 1/2 tabs 3 times a day.  Your physician has recommended that you have a cardiopulmonary stress test (CPX). CPX testing is a non-invasive measurement of heart and lung function. It replaces a traditional treadmill stress test. This type of test provides a tremendous amount of information that relates not only to your present condition but also for future outcomes. This test combines measurements of you ventilation, respiratory gas exchange in the lungs, electrocardiogram (EKG), blood pressure and physical response before, during, and following an exercise protocol.   Labs done today  Your physician recommends that you schedule a follow-up appointment in: 2-3 weeks

## 2017-04-25 NOTE — Progress Notes (Signed)
VAD eval testing appointments made. See letter that has been mailed to patient. Verbalized on the phone as well. CSW made aware and awaiting appointment with Surgeon.  Balinda Quails RN, VAD Coordinator 24/7 pager [phone]

## 2017-04-25 NOTE — Addendum Note (Signed)
Encounter addended by: Dorna Bloom D on: 04/25/2017  5:36 PM<BR>    Actions taken: Visit Navigator Flowsheet section accepted

## 2017-04-25 NOTE — Progress Notes (Signed)
Patient ID: [name], male   DOB: November 15, 1965, 50 y.o.   MRN: [id number] Reviewed: Agree with Dr. Graylin Shiver documentation and management.

## 2017-04-26 ENCOUNTER — Encounter (HOSPITAL_COMMUNITY): Payer: BLUE CROSS/BLUE SHIELD

## 2017-04-26 ENCOUNTER — Ambulatory Visit (INDEPENDENT_AMBULATORY_CARE_PROVIDER_SITE_OTHER): Payer: BLUE CROSS/BLUE SHIELD

## 2017-04-26 DIAGNOSIS — Z5181 Encounter for therapeutic drug level monitoring: Secondary | ICD-10-CM

## 2017-04-26 DIAGNOSIS — I82499 Acute embolism and thrombosis of other specified deep vein of unspecified lower extremity: Secondary | ICD-10-CM | POA: Diagnosis not present

## 2017-04-26 LAB — POCT INR: INR: 1.7

## 2017-04-26 NOTE — Progress Notes (Signed)
PCP: Dr. Ola Spurr HF Cardiology: Dr. Aundra Dubin  51 yo with history of CAD s/p anterior MI in 2010 and chronic systolic CHF (ischemic cardiomyopathy) was admitted in 3/18 with 10 days of increased dyspnea and nausea.  Patient had been stable until that time, taking Lasix 80 mg po bid. Noted that weight was rising and he was developing lower extremity edema that was spreading up to his abdomen.  He tried increasing Lasix to 80 mg tid but this did not help.  He had orthopnea.  No chest pain.  Symptoms and presentation consistent with CHF, but troponin found to be elevated at 5.11.  UOP initially poor with rise in creatinine to 2.6. He was found to have low output with co-ox 36% and was started on milrinone gtt with diuresis.  Volume status and creatinine gradually improved.  He eventually underwent cath with severe proximal RCA and mid LAD stenoses noted, these were treated with DES.   He was readmitted shortly after discharge with chest pain.  This was not thought to be ACS but he was found to be in AKI again with hyperkalemia.  Digoxin and spironolactone were stopped.  Torsemide was stopped for a couple of days.   Admitted 02/08/17-02/13/17 with acute on chronic systolic CHF, diuresed 15 pounds with IV lasix. Co ox was stable in the 70's. Also developed fever with leukocytosis, started on Vanc and Zoysn for 4 days then sent home with 4 days of Levaquin. Discharge weight was 187 pounds.   He was admitted again in 8/18 with acute on chronic systolic CHF.  Co-ox was ok, inotrope not needed.  He was diuresed and discharged.   Today, he says that he has good days and bad days.  Doing better than prior to last hospitalization.  Weight down about 7 lbs.  Still with 2-3 pillow orthopnea.  Short of breath after walking 100 feet (waxes/wanes).  He has atypical, nonexertional chest pain that occurs periodically, no trigger.   Labs (6/18): K 4.5, Creatinine 2.28 Labs (4/18): K 5.7 => 4.7, creatinine 1.67 => 1.95, hgb  12.8, BNP 383 Labs (5/18): K 4.8, creatinine 2.05.  Labs (8/18): K 3.7, creatinine 1.71, hgb 10.9  Corevue: No atrial fibrillation, 1 VT episode with ATP, impedance trending up.   PMH: 1. Chronic systolic CHF: Ischemic cardiomyopathy.  St Jude ICD.  - Echo (11/17) with EF 25-30%, moderate diastolic dysfunction, mild MR, mildly dilated RV with moderately decreased systolic function, peak RV-RA gradient 48 mmHg.  - Echo (3/18) with EF 25-30%, wall motion abnormalities, normal RV size and systolic function.  - Admission 3/18 with cardiogenic shock and AKI, required milrinone gtt.  - Echo (8/18) with EF 20-25%, mild LVH, moderate LV dilation, mild MR.  2. CAD: Anterior MI 2005, had bifurcation stenting LAD/diagonal.  Repeat cath 2010 with patent stents.  - NSTEMI 3/18: LHC with 99% ulcerated lesion proximal RCA, 95% mid LAD => PCI with DES to proximal RCA and mid LAD.  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  4. Crohns disease: History of colectomy.  5. GERD 6. Gastroparesis 7. HTN 8. Hyperlipidemia 9. Type II diabetes  10. CKD: Stage 3.   Social History   Social History  . Marital status: Married    Spouse name: N/A  . Number of children: N/A  . Years of education: N/A   Occupational History  . Not on file.   Social History Main Topics  . Smoking status: Never Smoker  . Smokeless tobacco: Never Used  . Alcohol  use No  . Drug use: No  . Sexual activity: No   Other Topics Concern  . Not on file   Social History Narrative  . No narrative on file   Family History  Problem Relation Age of Onset  . Colon polyps Mother   . Diabetes Mother   . Heart attack Father   . Diabetes Father   . Stroke Paternal Grandfather   . Colitis Sister   . Heart disease Brother   . Colitis Sister   . Colon cancer Neg Hx    Review of systems complete and found to be negative unless listed in HPI.    Current Outpatient Prescriptions  Medication Sig Dispense Refill  . atorvastatin (LIPITOR) 80  MG tablet Take 1 tablet (80 mg total) by mouth daily at 6 PM. 30 tablet 6  . carvedilol (COREG) 6.25 MG tablet Take 0.5 tablets (3.125 mg total) by mouth 2 (two) times daily. 60 tablet 0  . clopidogrel (PLAVIX) 75 MG tablet Take 1 tablet (75 mg total) by mouth daily. 30 tablet 6  . digoxin (LANOXIN) 0.125 MG tablet Take 1 tablet (0.125 mg total) by mouth daily. 90 tablet 3  . empagliflozin (JARDIANCE) 10 MG TABS tablet Take 10 mg by mouth daily. 30 tablet 3  . gabapentin (NEURONTIN) 300 MG capsule Take 2 capsules (600 mg total) by mouth 2 (two) times daily. 30 capsule 1  . glucose blood (CONTOUR NEXT TEST) test strip Use twice daily as directed 100 each 12  . insulin glargine (LANTUS) 100 UNIT/ML injection Inject 0.35 mLs (35 Units total) into the skin daily. 10 mL 11  . isosorbide-hydrALAZINE (BIDIL) 20-37.5 MG tablet Take 1.5 tablets by mouth 3 (three) times daily. 135 tablet 3  . metoCLOPramide (REGLAN) 5 MG tablet Take one tablet 1/2 hr before meals. 90 tablet 2  . nitroGLYCERIN (NITROSTAT) 0.4 MG SL tablet Place 1 tablet (0.4 mg total) under the tongue every 5 (five) minutes as needed for chest pain. 25 tablet 3  . ondansetron (ZOFRAN ODT) 4 MG disintegrating tablet Take 1 tablet (4 mg total) by mouth every 6 (six) hours as needed for nausea or vomiting. 40 tablet 1  . pantoprazole (PROTONIX) 40 MG tablet Take 40 mg by mouth daily.    [name] spironolactone (ALDACTONE) 25 MG tablet Take 0.5 tablets (12.5 mg total) by mouth daily. 30 tablet 0  . torsemide (DEMADEX) 20 MG tablet Take 2 tablets (40 mg total) by mouth every evening. 30 tablet 0  . torsemide (DEMADEX) 20 MG tablet Take 40-80 mg by mouth daily. Take 4 tablets in the AM and 2 tablets in the PM    . warfarin (COUMADIN) 5 MG tablet TAKE AS DIRECTED BY COUMADIN CLINIC 50 tablet 1   No current facility-administered medications for this encounter.    BP 131/75   Pulse 82   Wt 181 lb 12 oz (82.4 kg)   SpO2 100%   BMI 27.04 kg/m    Wt  Readings from Last 3 Encounters:  04/25/17 181 lb 12 oz (82.4 kg)  04/24/17 184 lb 6.4 oz (83.6 kg)  04/18/17 184 lb (83.5 kg)    General: NAD Neck: JVP 7-8 cm, no thyromegaly or thyroid nodule.  Lungs: Clear to auscultation bilaterally with normal respiratory effort. CV: Nondisplaced PMI.  Heart regular S1/S2, no S3/S4, no murmur.  No peripheral edema.  No carotid bruit.  Normal pedal pulses.  Abdomen: Soft, nontender, no hepatosplenomegaly, no distention.  Skin: Intact without lesions  or rashes.  Neurologic: Alert and oriented x 3.  Psych: Normal affect. Extremities: No clubbing or cyanosis.  HEENT: Normal.   Assessment/Plan: 1. Chronic systolic CHF: ICM, NSTEMI in March 2018 with DES to LAD and RCA, complicated by cardiogenic shock, low output requiring milrinone.  Most recent echo 8/18 with EF 20-25%, moderate MR.  NYHA class III symptoms but improved from prior to hospitalization.  He does not look significantly volume overloaded.  - Continue torsemide 80 qam/40 qpm.  BMET today.   - Increase Bidil to 1.5 tabs tid today.   - Continue current Coreg - Continue digoxin, check level today.  - Continue spironolactone, BMET today.   - Not on ACEI/ARB/ARNI with CKD.  - With 2 recent CHF admissions, we discussed Cardio[facility] placement, he is interesting in pursuing this. We will work on getting coverage from his insurance.  - he is currently getting PT at home, when completes will arrange cardiac rehab.  - We discussed possible need for advanced therapies today (LVAD, transplant).  His renal function is concerning for LVAD.  He is interested in speaking with an LVAD coordinator, I will arrange.  He also needs a CPX, which I will arrange.  2. CKD stage III: BMET today.  3. CAD: NSTEMI in 3/18. LHC with 99% ulcerated lesion proximal RCA with left to right collaterals, 95% mid LAD stenosis after mid LAD stent.  s/p PCI to RCA and LAD on 11/25/16.  No exertional chest pain.  - Continue Plavix. No  ASA due to warfarin use.  - Continue Coreg as above.  - Continue high intensity statin, check lipids today.  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  5. Chronic N/V/D with gastroparesis: Follows with GI.  6. DM: On Jardiance.   Followup in 2-3 weeks.  Loralie Champagne, MD 04/26/2017

## 2017-04-27 ENCOUNTER — Telehealth (HOSPITAL_COMMUNITY): Payer: Self-pay | Admitting: *Deleted

## 2017-04-27 DIAGNOSIS — I5022 Chronic systolic (congestive) heart failure: Secondary | ICD-10-CM

## 2017-04-27 MED ORDER — TORSEMIDE 20 MG PO TABS: ORAL_TABLET | ORAL | 3 refills | Status: DC

## 2017-04-27 NOTE — Progress Notes (Signed)
VAD evaluation consent reviewed and signed by pt. Initial VAD teaching completed with pt and caregiver(s). VAD teaching packet given to pt for their review. Patient and caregiver(s) asked questions and had good interaction with VAD coordinator; all questions answered regarding VAD implant,  hospital stay, and what to expect when discharged home. Pt identified his daughter as a primary caregiver. Explained need for 24/7 care when pt discharged home;  both pt and caregiver verbalized understanding of above.   Explained that LVAD can be implanted for two indications: 1. Bridge to transplant - used for patients who cannot safely wait for heart transplant.  2. Destination therapy - used for patients until end of life or recovery of heart function.  We will begin VAD workup including all tests and blood work.   [name] RN, BSN VAD Coordinator

## 2017-04-27 NOTE — Telephone Encounter (Signed)
Basic metabolic panel  Order: [id number]  Status:  Final result Visible to patient:  No (Not Released) Dx:  Congestive heart failure, unspecified...  Notes recorded by Darron Doom, RN on 04/27/2017 at 9:43 AM EDT Called and spoke with patient he is agreeable with plan and understands medication instructions. Lab appointment scheduled, bmet ordered, and medication updated. ------  Notes recorded by Larey Dresser, MD on 04/26/2017 at 5:00 PM EDT BUN is up a lot. Hold pm torsemide for 2 days, then change torsemide to 80 qam/40 qpm alternating with 80 mg daily (so only take the pm 40 mg every other day). Repeat BMET in 1 week.

## 2017-04-28 ENCOUNTER — Encounter (HOSPITAL_COMMUNITY): Payer: BLUE CROSS/BLUE SHIELD

## 2017-04-28 ENCOUNTER — Ambulatory Visit (HOSPITAL_COMMUNITY)
Admission: RE | Admit: 2017-04-28 | Discharge: 2017-04-28 | Disposition: A | Discharge status: Home / Self Care | Payer: BLUE CROSS/BLUE SHIELD | Source: Ambulatory Visit | Attending: Cardiology | Admitting: Cardiology

## 2017-04-28 ENCOUNTER — Ambulatory Visit (HOSPITAL_BASED_OUTPATIENT_CLINIC_OR_DEPARTMENT_OTHER)
Admission: RE | Admit: 2017-04-28 | Discharge: 2017-04-28 | Disposition: A | Discharge status: Home / Self Care | Payer: BLUE CROSS/BLUE SHIELD | Source: Ambulatory Visit | Attending: Cardiology | Admitting: Cardiology

## 2017-04-28 DIAGNOSIS — K047 Periapical abscess without sinus: Secondary | ICD-10-CM | POA: Insufficient documentation

## 2017-04-28 DIAGNOSIS — I509 Heart failure, unspecified: Secondary | ICD-10-CM

## 2017-04-28 DIAGNOSIS — I82432 Acute embolism and thrombosis of left popliteal vein: Secondary | ICD-10-CM | POA: Insufficient documentation

## 2017-04-28 DIAGNOSIS — I82412 Acute embolism and thrombosis of left femoral vein: Secondary | ICD-10-CM | POA: Insufficient documentation

## 2017-04-28 DIAGNOSIS — I6523 Occlusion and stenosis of bilateral carotid arteries: Secondary | ICD-10-CM | POA: Diagnosis not present

## 2017-04-28 DIAGNOSIS — I82431 Acute embolism and thrombosis of right popliteal vein: Secondary | ICD-10-CM | POA: Diagnosis not present

## 2017-04-28 DIAGNOSIS — I700 Atherosclerosis of aorta: Secondary | ICD-10-CM | POA: Insufficient documentation

## 2017-04-28 DIAGNOSIS — I82411 Acute embolism and thrombosis of right femoral vein: Secondary | ICD-10-CM | POA: Insufficient documentation

## 2017-04-28 DIAGNOSIS — R918 Other nonspecific abnormal finding of lung field: Secondary | ICD-10-CM | POA: Diagnosis not present

## 2017-04-28 DIAGNOSIS — K0499 Other diseases of pulp and periapical tissues: Secondary | ICD-10-CM | POA: Diagnosis not present

## 2017-04-28 LAB — VAS US DOPPLER PRE VAD
LCCADDIAS: 17 cm/s
LCCADSYS: 61 cm/s
LCCAPDIAS: 23 cm/s
LEFT ECA DIAS: 13 cm/s
LEFT VERTEBRAL DIAS: 16 cm/s
LICADSYS: -53 cm/s
LICAPDIAS: -25 cm/s
Left CCA prox sys: 86 cm/s
Left ICA dist dias: -25 cm/s
Left ICA prox sys: -67 cm/s
RCCADSYS: -69 cm/s
RCCAPSYS: 76 cm/s
RIGHT ECA DIAS: -16 cm/s
RIGHT VERTEBRAL DIAS: 27 cm/s
Right CCA prox dias: 19 cm/s

## 2017-04-28 NOTE — Progress Notes (Signed)
Pre-op Cardiac Surgery  Carotid Findings:  Bilateral: No significant (1-39%) ICA stenosis. Antegrade vertebral flow.     Right Left  Brachial Pressures 147 149   ABI Right Left  Dorsalis Pedis 85, mono 118, mono  Anterior Tibial    Posterior Tibial 85, mono 88, mono  Ankle/Brachial Indices 0.57 0.79   ABI indicates moderate disease.    Lower extremity venous duplex: Age indeterminate and chronic DVT noted in bilateral mid femoral veins and popliteal veins.     Landry Mellow, RDMS, RVT 04/28/2017

## 2017-05-01 ENCOUNTER — Telehealth (HOSPITAL_COMMUNITY): Payer: Self-pay | Admitting: Cardiology

## 2017-05-01 ENCOUNTER — Encounter (HOSPITAL_COMMUNITY): Payer: BLUE CROSS/BLUE SHIELD

## 2017-05-01 ENCOUNTER — Ambulatory Visit (HOSPITAL_COMMUNITY)
Admission: RE | Admit: 2017-05-01 | Discharge: 2017-05-01 | Disposition: A | Discharge status: Home / Self Care | Payer: BLUE CROSS/BLUE SHIELD | Source: Ambulatory Visit | Attending: Cardiology | Admitting: Cardiology

## 2017-05-01 DIAGNOSIS — I509 Heart failure, unspecified: Secondary | ICD-10-CM

## 2017-05-01 LAB — PULMONARY FUNCTION TEST
DL/VA % PRED: 114 %
DL/VA: 5.08 ml/min/mmHg/L
DLCO COR % PRED: 71 %
DLCO UNC: 17.82 ml/min/mmHg
DLCO cor: 20.31 ml/min/mmHg
DLCO unc % pred: 62 %
FEF 25-75 Post: 3.05 L/sec
FEF 25-75 Pre: 2.79 L/sec
FEF2575-%CHANGE-POST: 9 %
FEF2575-%PRED-POST: 103 %
FEF2575-%Pred-Pre: 94 %
FEV1-%CHANGE-POST: 11 %
FEV1-%PRED-POST: 76 %
FEV1-%Pred-Pre: 69 %
FEV1-PRE: 2.04 L
FEV1-Post: 2.26 L
FEV1FVC-%CHANGE-POST: 2 %
FEV1FVC-%Pred-Pre: 107 %
FEV6-%Change-Post: 7 %
FEV6-%PRED-PRE: 65 %
FEV6-%Pred-Post: 70 %
FEV6-PRE: 2.36 L
FEV6-Post: 2.53 L
FEV6FVC-%Change-Post: 0 %
FEV6FVC-%PRED-PRE: 103 %
FEV6FVC-%Pred-Post: 102 %
FVC-%CHANGE-POST: 7 %
FVC-%Pred-Post: 68 %
FVC-%Pred-Pre: 63 %
FVC-POST: 2.55 L
FVC-Pre: 2.36 L
POST FEV1/FVC RATIO: 89 %
PRE FEV6/FVC RATIO: 100 %
Post FEV6/FVC ratio: 99 %
Pre FEV1/FVC ratio: 86 %
RV % PRED: 114 %
RV: 2.19 L
TLC % pred: 75 %
TLC: 4.78 L

## 2017-05-01 MED ORDER — ALBUTEROL SULFATE (2.5 MG/3ML) 0.083% IN NEBU
2.5000 mg | INHALATION_SOLUTION | Freq: Once | RESPIRATORY_TRACT | Status: AC
  Administered 2017-05-01: 2.5 mg via RESPIRATORY_TRACT

## 2017-05-01 NOTE — Telephone Encounter (Signed)
He was only supposed to hold his afternoon torsemide for 2 days.  Take torsemide 80 mg bid for 1 day then go back to torsemide 80 qam/40 qpm.  Call asap.

## 2017-05-01 NOTE — Telephone Encounter (Signed)
Patient called with concerns of 6lb weight gain in 2 days, reports he was told to hold torsemide for 2 days then decrease. Patient denies increased SOB or swelling. Reports he has had nausea and vomiting for the past 3 days   Please advsie

## 2017-05-02 ENCOUNTER — Institutional Professional Consult (permissible substitution) (INDEPENDENT_AMBULATORY_CARE_PROVIDER_SITE_OTHER): Payer: BLUE CROSS/BLUE SHIELD | Admitting: Cardiothoracic Surgery

## 2017-05-02 ENCOUNTER — Encounter: Payer: Self-pay | Admitting: Cardiothoracic Surgery

## 2017-05-02 ENCOUNTER — Telehealth: Payer: Self-pay | Admitting: Licensed Clinical Social Worker

## 2017-05-02 VITALS — BP 106/65 | HR 82 | Resp 16 | Ht 68.75 in | Wt 185.0 lb

## 2017-05-02 DIAGNOSIS — I214 Non-ST elevation (NSTEMI) myocardial infarction: Secondary | ICD-10-CM | POA: Diagnosis not present

## 2017-05-02 DIAGNOSIS — I5023 Acute on chronic systolic (congestive) heart failure: Secondary | ICD-10-CM

## 2017-05-02 DIAGNOSIS — I255 Ischemic cardiomyopathy: Secondary | ICD-10-CM

## 2017-05-02 DIAGNOSIS — N183 Chronic kidney disease, stage 3 unspecified: Secondary | ICD-10-CM

## 2017-05-02 DIAGNOSIS — I340 Nonrheumatic mitral (valve) insufficiency: Secondary | ICD-10-CM | POA: Diagnosis not present

## 2017-05-02 NOTE — Progress Notes (Signed)
PCP is [name], MD Referring Provider is Larey Dresser, MD  Chief Complaint  Patient presents with  . Congestive Heart Failure    LVAD EVAL...ECHO 04/06/17.Marland KitchenCATH with stents s/p NSTEMI.Marland KitchenMarland Kitchen3/21/23/18  Patient examined, coronary arteriogram, echocardiogram and CT scan of chest images personally reviewed and counseled with patient.  HPI: 51 year old AA male with ischemic cardiomyopathy, ejection fraction 15-20% and moderate RV dysfunction. The patient has been admitted to the hospital twice in the past 6 months for episodes of congestive heart failure. The patient is being followed at the advanced heart failure clinic by Dr. Aundra Dubin who wishes evaluation of the patient for possible implantable left ventricular assist device for destination therapy indication.  The patient is currently with class IIIB symptoms. He is complaining of increased fatigue and decreasing activity tolerance. He is working part-time as a Academic librarian. He is undergoing outpatient evaluation for possible VAD. He is scheduled to see the social worker in the next few days.  The patient had a initial MI several years ago. Earlier this year he underwent cardiac catheterization which demonstrated high-grade stenosis of the LAD and RCA. This was treated with PCI in March 2018. He was placed on Plavix at that time.  The patient has history of chronic atrial fibrillation and has been on warfarin 5 mg alternating with 7.5 mg 4 years. He has an AICD-bi V pacer which was placed 6-7 years ago and has had one battery change. He denies being shocked by the device ever.  The patient has history of Crohn's[granulomatous] colitis status post hemicolectomy years ago. He did have a colostomy which was then taken down. He now has normal bowel function although he has 1-2 stools per day. Even though he is been on Coumadin and Plavix he denies any history of GI bleeding. The patient denies any active symptoms of Crohn's  disease and is not taking any medication at this time for Crohn's disease. He states his last colonoscopy was greater than 5 years ago. He states a recent plan for colonoscopy was canceled because of his poor cardiac status and concern over sedation. No problems with anesthesia bleeding or pulmonary issues for the hemicolectomy.  The patient has diabetes and neuropathy and uses a cane to walk because of problems with balance. He denies previous stroke. His most recent carotid Doppler scan showed no significant carotid disease. He does have peripheral vascular  disease by ABIs-0.5 on the right leg and 0.6 on the left leg.  Venous Doppler show evidence of bilateral chronic deep femoral vein thrombosis. His CT scan of the chest shows some low risk scattered nodules. This was a noncontrast study and pulmonary emboli cannot be Rule out so the patient will need a V/Q pulmonary skin.  The patient is allergic to metformin which causes diarrhea and nausea and vomiting.  Past Medical History:  Diagnosis Date  . Angina   . ASCVD (arteriosclerotic cardiovascular disease)    , Anterior infarction 2005, LAD diagonal bifurcation intervention 03/2004  . Automatic implantable cardiac defibrillator -[employer]. Jude's       . Benign neoplasm of colon   . CHF (congestive heart failure) ([place])   . Chronic systolic heart failure ([place])   . Coronary artery disease     Widely patent previously placed stents in the left anterior   . Crohn's disease ([place])   . 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 Dyspnea   . Gastroparesis   . GERD (gastroesophageal reflux disease)   .  High cholesterol   . Hyperlipidemia   . Hypersomnolent    Previous diagnosis of narcolepsy  . Hypertension, essential   . Ischemic cardiomyopathy    Ejection fraction 15-20% catheterization 2010  . Type II or unspecified type diabetes mellitus without mention of complication, not stated as uncontrolled   . Unspecified gastritis and  gastroduodenitis without mention of hemorrhage     Past Surgical History:  Procedure Laterality Date  . APPENDECTOMY    . CARDIAC DEFIBRILLATOR PLACEMENT  2010   St. Jude ICD  . COLECTOMY     "for Crohn's"  . COLON SURGERY    . CORONARY STENT INTERVENTION N/A 11/25/2016   Procedure: Coronary Stent Intervention;  Surgeon: Leonie Man, MD;  Location: [facility];  Service: Cardiovascular;  Laterality: N/A;  . EP IMPLANTABLE DEVICE N/A 09/14/2016   Procedure: ICD Generator Changeout;  Surgeon: Deboraha Sprang, MD;  Location: [facility];  Service: Cardiovascular;  Laterality: N/A;  . FETAL SURGERY FOR CONGENITAL HERNIA    . INGUINAL HERNIA REPAIR    . RIGHT/LEFT HEART CATH AND CORONARY ANGIOGRAPHY N/A 11/23/2016   Procedure: Right/Left Heart Cath and Coronary Angiography;  Surgeon: Larey Dresser, MD;  Location: [facility];  Service: Cardiovascular;  Laterality: N/A;    Family History  Problem Relation Age of Onset  . Colon polyps Mother   . Diabetes Mother   . Heart attack Father   . Diabetes Father   . Stroke Paternal Grandfather   . Colitis Sister   . Heart disease Brother   . Colitis Sister   . Colon cancer Neg Hx     Social History Social History  Substance Use Topics  . Smoking status: Never Smoker  . Smokeless tobacco: Never Used  . Alcohol use No  Patient has good family support  Current Outpatient Prescriptions  Medication Sig Dispense Refill  . atorvastatin (LIPITOR) 80 MG tablet Take 1 tablet (80 mg total) by mouth daily at 6 PM. 30 tablet 6  . carvedilol (COREG) 6.25 MG tablet Take 0.5 tablets (3.125 mg total) by mouth 2 (two) times daily. 60 tablet 0  . clopidogrel (PLAVIX) 75 MG tablet Take 1 tablet (75 mg total) by mouth daily. 30 tablet 6  . digoxin (LANOXIN) 0.125 MG tablet Take 1 tablet (0.125 mg total) by mouth daily. 90 tablet 3  . empagliflozin (JARDIANCE) 10 MG TABS tablet Take 10 mg by mouth daily. 30 tablet 3  . gabapentin  (NEURONTIN) 300 MG capsule Take 2 capsules (600 mg total) by mouth 2 (two) times daily. 30 capsule 1  . glucose blood (CONTOUR NEXT TEST) test strip Use twice daily as directed 100 each 12  . insulin glargine (LANTUS) 100 UNIT/ML injection Inject 0.35 mLs (35 Units total) into the skin daily. 10 mL 11  . isosorbide-hydrALAZINE (BIDIL) 20-37.5 MG tablet Take 1.5 tablets by mouth 3 (three) times daily. 135 tablet 3  . metoCLOPramide (REGLAN) 5 MG tablet Take one tablet 1/2 hr before meals. 90 tablet 2  . nitroGLYCERIN (NITROSTAT) 0.4 MG SL tablet Place 1 tablet (0.4 mg total) under the tongue every 5 (five) minutes as needed for chest pain. 25 tablet 3  . ondansetron (ZOFRAN ODT) 4 MG disintegrating tablet Take 1 tablet (4 mg total) by mouth every 6 (six) hours as needed for nausea or vomiting. 40 tablet 1  . pantoprazole (PROTONIX) 40 MG tablet Take 40 mg by mouth daily.    [name] spironolactone (ALDACTONE)  25 MG tablet Take 0.5 tablets (12.5 mg total) by mouth daily. 30 tablet 0  . torsemide (DEMADEX) 20 MG tablet Take 80 mg (4 Tabs) Daily in the AM and 40 mg (2 Tabs) Every other evening. 180 tablet 3  . warfarin (COUMADIN) 5 MG tablet TAKE AS DIRECTED BY COUMADIN CLINIC 50 tablet 1   No current facility-administered medications for this visit.     Allergies  Allergen Reactions  . Metformin And Related Diarrhea    Review of Systems         Review of Systems :  [ y ] = yes, [  ] = no        General :  Weight gain [   ]    Weight loss  [   ]  Fatigue [ yes ]  Fever [  ]  Chills  [  ]                                Weakness  [ yes ]          HEENT    Headache [  ]  Dizziness [  ]  Blurred vision [  ] Glaucoma  [  ]                          Nosebleeds [  ] Painful or loose teeth [ yes ]        Cardiac :  Chest pain/ pressure [  ]  Resting SOB [  ] exertional SOB [ yes ]                        Orthopnea [place].Blacker  ]  Pedal edema  [  ]  Palpitations [  ] Syncope/presyncope [ ]                          Paroxysmal nocturnal dyspnea [  ]         Pulmonary : cough [place]er  ]  wheezing [  ]  Hemoptysis [  ] Sputum [  ] Snoring [  ]                              Pneumothorax [  ]  Sleep apnea [  ]        GI : Vomiting [  ]  Dysphagia [  ]  Melena  [  ]  Abdominal pain [  ] BRBPR [  ]              Heart burn [  ]  Constipation [  ] Diarrhea  [place].Blacker  ] Colonoscopy [   ]        GU : Hematuria [  ]  Dysuria [  ]  Nocturia [  ] UTI's [  ]        Vascular : Claudication [  ]  Rest pain [  ]  DVT [place].Blacker  ] Vein stripping [  ] leg ulcers [  ]                          TIA [  ] Stroke [  ]  Varicose veins [  ]  NEURO :  Headaches  [  ] Seizures [  ] Vision changes [  ] Paresthesias [  ]       right-hand-dominant                                Seizures [  ]        Musculoskeletal :  Arthritis [  ] Gout  [  ]  Back pain [  ]  Joint pain [  ]        Skin :  Rash [  ]  Melanoma [  ] Sores [  ]        Heme : Bleeding problems [  ]Clotting Disorders [  ] Anemia [  ]Blood Transfusion [ ]         Endocrine : Diabetes [ yes ] Heat or Cold intolerance [  ] Polyuria [  ]excessive thirst [ ]         Psych : Depression [  ]  Anxiety [  ]  Psych hospitalizations [  ] Memory change [  ]                                               BP 106/65 (BP Location: Left Arm, Patient Position: Sitting, Cuff Size: Large)   Pulse 82   Resp 16   Ht 5' 8.75" (1.746 m)   Wt 185 lb (83.9 kg)   SpO2 98% Comment: ON RA  BMI 27.52 kg/m  Physical Exam      Physical Exam  General: Chronically ill-appearing middle-aged AA male no acute distress . Patient very informative during interview  HEENT: Normocephalic pupils equal , dentition are  very poor  Neck: Supple without JVD, adenopathy, or bruit Chest: Clear to auscultation, symmetrical breath sounds, no rhonchi, no tenderness             or deformity Cardiovascular: Atrial fibrillation  no murmur, no gallop, peripheral pulses             palpable in all  extremities Abdomen:  Soft, nontender, no palpable mass or organomegaly Extremities: Warm, well-perfused, no clubbing cyanosis edema or tenderness,              no venous stasis changes of the legs Rectal/GU: Deferred Neuro: Grossly non--focal and symmetrical throughout Skin: Clean and dry without rash or ulceration   Diagnostic Tests: CT scan echocardiogram coronary angiogram and vascular Doppler studies all reviewed  Impression: Advanced ischemic cardiomyopathy with class IIIB heart failure Moderate RV dysfunction by echo, right heart cath shows PA pulsatility index 12.5 and CVP-wedge ratio 0.2, both indicative of adequate RV function for VAD Bilateral DVT but on chronic Coumadin-patient needs V/Q scan to rule out chronic pulmonary emboli  Several necrotic teeth which will need dental evaluation and probable extraction  With recent PCI using drug-eluting stents the patient will probably need to be admitted prior to surgery and bridged with Aggrastat and have a Plavix washout prior to VAD implant and at the same time a Coumadin washout with heparin.  Plan:Continue with full evaluation for implantable VAD. Further patient review at medical review board conference.   Len Childs, MD [facility] [phone]

## 2017-05-02 NOTE — Telephone Encounter (Signed)
CSW contacted patient to schedule LVAD Assessment per request at [facility] yesterday. Patient available at 1:30pm on Thursday August 30 and will meet with CSW in the clinic. Raquel Sarna, [place], [place]

## 2017-05-02 NOTE — Telephone Encounter (Signed)
Left message to call back  

## 2017-05-03 ENCOUNTER — Other Ambulatory Visit (HOSPITAL_COMMUNITY): Payer: Self-pay | Admitting: *Deleted

## 2017-05-03 ENCOUNTER — Encounter (HOSPITAL_COMMUNITY): Payer: BLUE CROSS/BLUE SHIELD

## 2017-05-03 DIAGNOSIS — I509 Heart failure, unspecified: Secondary | ICD-10-CM

## 2017-05-03 NOTE — Telephone Encounter (Signed)
Pt aware and voiced understanding 

## 2017-05-04 ENCOUNTER — Encounter: Payer: Self-pay | Admitting: Licensed Clinical Social Worker

## 2017-05-04 ENCOUNTER — Ambulatory Visit (HOSPITAL_COMMUNITY)
Admission: RE | Admit: 2017-05-04 | Discharge: 2017-05-04 | Disposition: A | Discharge status: Home / Self Care | Payer: BLUE CROSS/BLUE SHIELD | Source: Ambulatory Visit | Attending: Cardiology | Admitting: Cardiology

## 2017-05-04 ENCOUNTER — Ambulatory Visit (HOSPITAL_COMMUNITY): Payer: BLUE CROSS/BLUE SHIELD

## 2017-05-04 ENCOUNTER — Telehealth (HOSPITAL_COMMUNITY): Payer: Self-pay

## 2017-05-04 ENCOUNTER — Other Ambulatory Visit: Payer: Self-pay

## 2017-05-04 DIAGNOSIS — I509 Heart failure, unspecified: Secondary | ICD-10-CM | POA: Diagnosis not present

## 2017-05-04 DIAGNOSIS — R942 Abnormal results of pulmonary function studies: Secondary | ICD-10-CM | POA: Diagnosis not present

## 2017-05-04 DIAGNOSIS — J449 Chronic obstructive pulmonary disease, unspecified: Secondary | ICD-10-CM | POA: Insufficient documentation

## 2017-05-04 DIAGNOSIS — Z95810 Presence of automatic (implantable) cardiac defibrillator: Secondary | ICD-10-CM | POA: Insufficient documentation

## 2017-05-04 DIAGNOSIS — I517 Cardiomegaly: Secondary | ICD-10-CM | POA: Diagnosis not present

## 2017-05-04 DIAGNOSIS — I5022 Chronic systolic (congestive) heart failure: Secondary | ICD-10-CM

## 2017-05-04 DIAGNOSIS — J9811 Atelectasis: Secondary | ICD-10-CM | POA: Diagnosis not present

## 2017-05-04 DIAGNOSIS — J986 Disorders of diaphragm: Secondary | ICD-10-CM | POA: Insufficient documentation

## 2017-05-04 LAB — COMPREHENSIVE METABOLIC PANEL
ALBUMIN: 3.6 g/dL (ref 3.5–5.0)
ALK PHOS: 139 U/L — AB (ref 38–126)
ALT: 25 U/L (ref 17–63)
ANION GAP: 11 (ref 5–15)
AST: 23 U/L (ref 15–41)
BUN: 54 mg/dL — AB (ref 6–20)
CALCIUM: 7.9 mg/dL — AB (ref 8.9–10.3)
CO2: 27 mmol/L (ref 22–32)
Chloride: 101 mmol/L (ref 101–111)
Creatinine, Ser: 2.05 mg/dL — ABNORMAL HIGH (ref 0.61–1.24)
GFR calc Af Amer: 41 mL/min — ABNORMAL LOW (ref >60)
GFR calc non Af Amer: 36 mL/min — ABNORMAL LOW (ref >60)
GLUCOSE: 218 mg/dL — AB (ref 65–99)
POTASSIUM: 3.8 mmol/L (ref 3.5–5.1)
SODIUM: 139 mmol/L (ref 135–145)
Total Bilirubin: 0.6 mg/dL (ref 0.3–1.2)
Total Protein: 7.6 g/dL (ref 6.5–8.1)

## 2017-05-04 LAB — LACTATE DEHYDROGENASE: LDH: 273 U/L — ABNORMAL HIGH (ref 98–192)

## 2017-05-04 LAB — ANTITHROMBIN III: AntiThromb III Func: 115 % (ref 75–120)

## 2017-05-04 LAB — CUP PACEART REMOTE DEVICE CHECK
Battery Voltage: 3.13 V
Brady Statistic AP VS Percent: 1.8 %
Brady Statistic AS VP Percent: 1 %
Brady Statistic AS VS Percent: 97 %
HIGH POWER IMPEDANCE MEASURED VALUE: 34 Ohm
HighPow Impedance: 35 Ohm
Implantable Lead Implant Date: 20100430
Implantable Lead Implant Date: 20100430
Implantable Lead Location: 753859
Implantable Lead Location: 753860
Implantable Lead Model: 7121
Lead Channel Impedance Value: 350 Ohm
Lead Channel Pacing Threshold Amplitude: 1 V
Lead Channel Pacing Threshold Pulse Width: 0.5 ms
Lead Channel Sensing Intrinsic Amplitude: 12 mV
Lead Channel Setting Pacing Amplitude: 2 V
Lead Channel Setting Pacing Amplitude: 2.5 V
Lead Channel Setting Sensing Sensitivity: 0.5 mV
MDC IDC MSMT BATTERY REMAINING LONGEVITY: 83 mo
MDC IDC MSMT BATTERY REMAINING PERCENTAGE: 91 %
MDC IDC MSMT LEADCHNL RA IMPEDANCE VALUE: 340 Ohm
MDC IDC MSMT LEADCHNL RA PACING THRESHOLD PULSEWIDTH: 0.5 ms
MDC IDC MSMT LEADCHNL RA SENSING INTR AMPL: 1.8 mV
MDC IDC MSMT LEADCHNL RV PACING THRESHOLD AMPLITUDE: 1 V
MDC IDC PG IMPLANT DT: 20180110
MDC IDC SESS DTM: 20180730131002
MDC IDC SET LEADCHNL RV PACING PULSEWIDTH: 0.5 ms
MDC IDC STAT BRADY AP VP PERCENT: 1 %
MDC IDC STAT BRADY RA PERCENT PACED: 1 %
MDC IDC STAT BRADY RV PERCENT PACED: 1 %
Pulse Gen Serial Number: 7383809

## 2017-05-04 LAB — TYPE AND SCREEN
ABO/RH(D): O POS
ANTIBODY SCREEN: NEGATIVE

## 2017-05-04 LAB — LIPID PANEL
CHOL/HDL RATIO: 2.6 ratio
CHOLESTEROL: 128 mg/dL (ref 0–200)
HDL: 50 mg/dL (ref >40)
LDL Cholesterol: 54 mg/dL (ref 0–99)
Triglycerides: 118 mg/dL (ref <150)
VLDL: 24 mg/dL (ref 0–40)

## 2017-05-04 LAB — PROTIME-INR
INR: 1.58
Prothrombin Time: 18.7 seconds — ABNORMAL HIGH (ref 11.4–15.2)

## 2017-05-04 LAB — TSH: TSH: 2.353 u[IU]/mL (ref 0.350–4.500)

## 2017-05-04 LAB — PSA: PROSTATIC SPECIFIC ANTIGEN: 0.55 ng/mL (ref 0.00–4.00)

## 2017-05-04 LAB — HEMOGLOBIN A1C
HEMOGLOBIN A1C: 9.7 % — AB (ref 4.8–5.6)
MEAN PLASMA GLUCOSE: 231.69 mg/dL

## 2017-05-04 LAB — APTT: aPTT: 34 seconds (ref 24–36)

## 2017-05-04 LAB — URIC ACID: Uric Acid, Serum: 10.9 mg/dL — ABNORMAL HIGH (ref 4.4–7.6)

## 2017-05-04 LAB — MAGNESIUM: Magnesium: 1.1 mg/dL — ABNORMAL LOW (ref 1.7–2.4)

## 2017-05-04 LAB — ABO/RH: ABO/RH(D): O POS

## 2017-05-04 LAB — PREALBUMIN: Prealbumin: 29.6 mg/dL (ref 18–38)

## 2017-05-04 NOTE — Telephone Encounter (Signed)
Spoke w/pt he is aware and agreeable, if wt not down by Monday he will call us back.

## 2017-05-04 NOTE — Telephone Encounter (Signed)
Pt called about increasing weight gain. On Monday he called due to having 6 lbs increase after holding torsemide for 2 days. He was instructed on yesterday to increase torsemide 80 mg BID x1 day then 80 mg am and 40 mg pm. Pt. States he took medications as directed and had 4 lbs increase since yesterday weight is now 190 lbs from 178 lbs last week. Pt. Denies increased sob, cough, and edema. Pt c/o of increased fatigue. Will send to Dr. Aundra Dubin for review and further recommendations.

## 2017-05-04 NOTE — Telephone Encounter (Signed)
Continue current plan to increase torsemide.

## 2017-05-05 ENCOUNTER — Telehealth (HOSPITAL_COMMUNITY): Payer: Self-pay | Admitting: *Deleted

## 2017-05-05 ENCOUNTER — Encounter (HOSPITAL_COMMUNITY): Payer: BLUE CROSS/BLUE SHIELD

## 2017-05-05 ENCOUNTER — Encounter (HOSPITAL_COMMUNITY)
Admission: RE | Admit: 2017-05-05 | Discharge: 2017-05-05 | Disposition: A | Discharge status: Home / Self Care | Payer: BLUE CROSS/BLUE SHIELD | Source: Ambulatory Visit | Attending: Cardiothoracic Surgery | Admitting: Cardiothoracic Surgery

## 2017-05-05 ENCOUNTER — Ambulatory Visit (HOSPITAL_COMMUNITY)
Admission: RE | Admit: 2017-05-05 | Discharge: 2017-05-05 | Disposition: A | Discharge status: Home / Self Care | Payer: BLUE CROSS/BLUE SHIELD | Source: Ambulatory Visit | Attending: Cardiothoracic Surgery | Admitting: Cardiothoracic Surgery

## 2017-05-05 DIAGNOSIS — I509 Heart failure, unspecified: Secondary | ICD-10-CM | POA: Diagnosis not present

## 2017-05-05 DIAGNOSIS — I5022 Chronic systolic (congestive) heart failure: Secondary | ICD-10-CM

## 2017-05-05 LAB — HEPATITIS B SURFACE ANTIGEN: Hepatitis B Surface Ag: NEGATIVE

## 2017-05-05 LAB — DRVVT MIX: DRVVT MIX: 37.6 s (ref 0.0–47.0)

## 2017-05-05 LAB — HEPATITIS B SURFACE ANTIBODY,QUALITATIVE: Hep B S Ab: NONREACTIVE

## 2017-05-05 LAB — LUPUS ANTICOAGULANT PANEL
DRVVT: 47.4 s — ABNORMAL HIGH (ref 0.0–47.0)
PTT Lupus Anticoagulant: 32.6 s (ref 0.0–51.9)

## 2017-05-05 LAB — T4: T4, Total: 8.3 ug/dL (ref 4.5–12.0)

## 2017-05-05 LAB — HEPATITIS C ANTIBODY: HCV Ab: <0.1 s/co ratio (ref 0.0–0.9)

## 2017-05-05 LAB — HEPATITIS B CORE ANTIBODY, TOTAL: Hep B Core Total Ab: NEGATIVE

## 2017-05-05 LAB — HIV ANTIBODY (ROUTINE TESTING W REFLEX): HIV SCREEN 4TH GENERATION: NONREACTIVE

## 2017-05-05 MED ORDER — TECHNETIUM TC 99M DIETHYLENETRIAME-PENTAACETIC ACID
32.0000 | Freq: Once | INTRAVENOUS | Status: AC | PRN
  Administered 2017-05-05: 32 via INTRAVENOUS

## 2017-05-05 MED ORDER — MAGNESIUM OXIDE 400 MG PO TABS: 400.0000 mg | ORAL_TABLET | Freq: Every day | ORAL | 3 refills | Status: DC

## 2017-05-05 MED ORDER — TECHNETIUM TO 99M ALBUMIN AGGREGATED
4.0000 | Freq: Once | INTRAVENOUS | Status: AC | PRN
  Administered 2017-05-05: 4 via INTRAVENOUS

## 2017-05-05 NOTE — Telephone Encounter (Signed)
-----   Message from Larey Dresser, MD sent at 05/04/2017  4:48 PM EDT ----- Good lipids.  Creatinine lower.  HgbA1c too high, need to contact PCP for med adjustments.  Mg is very low, start Mg Oxide 400 mg daily with Mg level in 2 wks.

## 2017-05-08 ENCOUNTER — Encounter (HOSPITAL_COMMUNITY): Payer: BLUE CROSS/BLUE SHIELD

## 2017-05-09 ENCOUNTER — Telehealth (HOSPITAL_COMMUNITY): Payer: Self-pay | Admitting: *Deleted

## 2017-05-09 NOTE — Telephone Encounter (Signed)
NM Pulmonary Perf and Vent  Order: [id number]  Status:  Final result Visible to patient:  No (Not Released) Dx:  Heart failure, type unknown ([place])  Notes recorded by Darron Doom, RN on 05/09/2017 at 1:14 PM EDT Called and spoke with patient he is aware and no further questions. ------  Notes recorded by Larey Dresser, MD on 05/06/2017 at 10:05 PM EDT Normal study

## 2017-05-09 NOTE — Telephone Encounter (Signed)
-----   Message from Larey Dresser, MD sent at 05/09/2017  2:45 PM EDT ----- Regarding: RE: Stable for cardiac rehab yes ----- Message ----- From: Rowe Pavy, RN Sent: 05/09/2017   1:57 PM To: Larey Dresser, MD Subject: Stable for cardiac rehab                       Dr. Aundra Dubin,  The above pt was referred to cardiac rehab after his recent hospitalization in August. Pt was to receive Home PT - unclear if he is currently still receiving  As you know pt was referred and attended 7 sessions from 5/15 to 6/13.  Reviewed pt medical history, pt is being evaluated for LVAD (seen by Dr. Lawson Fiscal).  Is pt medically stable to participate in group exercise?  Thank you for your input Maurice Small RN, BSN Cardiac and Pulmonary Rehab Nurse Navigator

## 2017-05-09 NOTE — Telephone Encounter (Signed)
Cardiopulmonary exercise test  Order: [id number]  Status:  Final result Visible to patient:  No (Not Released) Dx:  Congestive heart failure, unspecified...  Notes recorded by Darron Doom, RN on 05/09/2017 at 1:17 PM EDT Called and spoke with patient he is aware and no further questions. ------  Notes recorded by Larey Dresser, MD on 05/04/2017 at 4:46 PM EDT Severely reduced functional capacity due to HF. Will continue LVAD workup.

## 2017-05-10 ENCOUNTER — Other Ambulatory Visit: Payer: Self-pay | Admitting: Internal Medicine

## 2017-05-10 ENCOUNTER — Encounter (HOSPITAL_COMMUNITY): Payer: BLUE CROSS/BLUE SHIELD

## 2017-05-10 ENCOUNTER — Other Ambulatory Visit (HOSPITAL_COMMUNITY): Payer: Self-pay | Admitting: Cardiology

## 2017-05-11 ENCOUNTER — Encounter: Payer: Self-pay | Admitting: Pharmacist

## 2017-05-11 ENCOUNTER — Ambulatory Visit (INDEPENDENT_AMBULATORY_CARE_PROVIDER_SITE_OTHER): Payer: BLUE CROSS/BLUE SHIELD | Admitting: Pharmacist

## 2017-05-11 DIAGNOSIS — E118 Type 2 diabetes mellitus with unspecified complications: Secondary | ICD-10-CM | POA: Diagnosis not present

## 2017-05-11 DIAGNOSIS — Z794 Long term (current) use of insulin: Secondary | ICD-10-CM

## 2017-05-11 DIAGNOSIS — I509 Heart failure, unspecified: Secondary | ICD-10-CM

## 2017-05-11 MED ORDER — INSULIN GLARGINE 100 UNIT/ML ~~LOC~~ SOLN: 21.0000 [IU] | Freq: Every day | SUBCUTANEOUS | Status: DC

## 2017-05-11 MED ORDER — EMPAGLIFLOZIN 25 MG PO TABS: 25.0000 mg | ORAL_TABLET | Freq: Every day | ORAL | 3 refills | Status: DC

## 2017-05-11 MED ORDER — INSULIN ASPART 100 UNIT/ML ~~LOC~~ SOLN: 7.0000 [IU] | Freq: Every day | SUBCUTANEOUS | 0 refills | Status: DC

## 2017-05-11 MED ORDER — INSULIN ASPART 100 UNIT/ML ~~LOC~~ SOLN: 7.0000 [IU] | Freq: Every day | SUBCUTANEOUS | 3 refills | Status: DC

## 2017-05-11 MED ORDER — TORSEMIDE 20 MG PO TABS: ORAL_TABLET | ORAL | 3 refills | Status: DC

## 2017-05-11 NOTE — Assessment & Plan Note (Signed)
Edema - increased with increasing weights. Patient was examined by Dr. Mingo Amber. Has appointment with [facility] next week.  -Increased torsemide to 80 mg PO BID for now until seen by [facility] providers -BMET today

## 2017-05-11 NOTE — Patient Instructions (Addendum)
Thanks for coming to see [name] today!   1. Increase your Jardiance to 25 mg by mouth once a day. That new prescription was sent to the pharmacy. You can take two of the 10 mg tablets until they are gone.   2. Continue Lantus 21 units once a day  3. Start Novolog 7 units once a day with the biggest meal  4. Increase your torsemide to 80 mg twice a day  5. Labs today  Call [name] if you see any sugars less than 80. Come back to see Dr. Ola Spurr in ~1 month, see me in ~ 2 months

## 2017-05-11 NOTE — Assessment & Plan Note (Signed)
Diabetes longstanding > 20 years currently uncontrolled. Patient denies hypoglycemic events and is able to verbalize appropriate hypoglycemia management plan. Patient reports adherence with medication. Control is suboptimal due to insulin resistance, inability to exercise/sedentary lifestyle, dietary indiscretion. Continued basal insulin Lantus (insulin glargine) at 21 units daily. Started rapid insulin Novolog (insulin aspart) 7 units with biggest meal once a day. Sample of novolog vials provided.  Increased dose of Jardiance (empagliflozin) to 25 mg daily which may also improve diuresis. [name]  Next A1C anticipated December, 2018.

## 2017-05-11 NOTE — Progress Notes (Addendum)
    S:     Chief Complaint  Patient presents with  . Medication Management    Diabetes    Patient arrives in fair spirits, ambulating without assistance.  Presents for diabetes evaluation, education, and management at the request of Dr. Ola Spurr. Patient was referred on 04/18/17.  Patient was last seen by Primary Care Provider on 04/18/17. Last seen in pharmacy clinic on 04/24/2017. At that time, he was provided with a BG meter, all other medications were continued.   Today, patient does not think that his CBGs are improved. Has been on and off reglan for 10-11 years per patient report. Previously, he was on bolus insulin several years ago. Not averse to restarting.   Patient reports Diabetes was diagnosed 25-30 years ago.  Patient reports adherence with medications. Daughter fills pill boxes for patient. Current diabetes medications include: Lantus 21 units daily, jardiance 10 mg daily,  Current hypertension medications include: torsemide 80 mg qAM/40 mg qPM, spironolactone 12.5 mg daily, bidil 20-37.5 mg TID, carvedilol 3.125 mg BID.  Patient denies hypoglycemic events.  Patient reported dietary habits: Eats 2-3 meals/day Breakfast: eggs Lunch: sometimes misses Dinner: chicken wings (fried), green beans Snacks: doesn't snack Drinks: water  Patient reported exercise habits: physical therapy two days/week, none outside of that. Limited by HF sx.   Patient reports nocturia.  Patient denies pain/burning on urination Patient reports neuropathy daily. Mornings 3/10 pain, evenings 7/10 pain. Describes as "tingly, balls of feet achy and numb". Gabapentin is "alright", hasn't missed doses but doesn't help much per patient. Patient denies visual changes. Patient denies self foot exams.   O:  Physical Exam  Constitutional: He appears well-developed and well-nourished.     Review of Systems  Respiratory: Positive for shortness of breath.   Cardiovascular: Positive for leg swelling.       Lab Results  Component Value Date   HGBA1C 9.7 (H) 05/04/2017   Vitals:   05/11/17 0914  BP: 134/74  Pulse: 79    Home fasting CBG: 148 this AM, usually 170s-180s  A/P: Diabetes longstanding > 20 years currently uncontrolled. Patient denies hypoglycemic events and is able to verbalize appropriate hypoglycemia management plan. Patient reports adherence with medication. Control is suboptimal due to insulin resistance, inability to exercise/sedentary lifestyle, dietary indiscretion. Continued basal insulin Lantus (insulin glargine) at 21 units daily. Started rapid insulin Novolog (insulin aspart) 7 units with biggest meal once a day. Sample of novolog vials provided.  Increased dose of Jardiance (empagliflozin) to 25 mg daily which may also improve diuresis. [name]  Next A1C anticipated December, 2018.    ASCVD risk greater than 7.5 mg  Continued atorvastatin 80 mg.   Hypertension longstanding currently controlled.  Patient reports adherence with medication.  Continued all medications  Edema - increased with increasing weights. Patient was examined by Dr. Mingo Amber. Has appointment with [facility] next week.  -Increased torsemide to 80 mg PO BID for now until seen by [facility] providers -BMET today   Written patient instructions provided.  Total time in face to face counseling 30 minutes.   Follow up in Pharmacist Clinic Visit 2 months, follow up with PCP in 1 month.   Patient seen with Cleotis Lema, PharmD Candidate, and Deirdre Pippins, PharmD PGY-2 Resident.    9/7 BMET - reviewed - stable values as previous. No change in above plan.

## 2017-05-11 NOTE — Progress Notes (Signed)
Patient ID: [name], male   DOB: 1966/01/01, 51 y.o.   MRN: [id number] Reviewed: Agree with Dr. Graylin Shiver documentation and management.

## 2017-05-12 ENCOUNTER — Encounter (HOSPITAL_COMMUNITY): Payer: BLUE CROSS/BLUE SHIELD

## 2017-05-12 LAB — BASIC METABOLIC PANEL
BUN / CREAT RATIO: 32 — AB (ref 9–20)
BUN: 68 mg/dL — ABNORMAL HIGH (ref 6–24)
CO2: 28 mmol/L (ref 20–29)
CREATININE: 2.14 mg/dL — AB (ref 0.76–1.27)
Calcium: 9 mg/dL (ref 8.7–10.2)
Chloride: 97 mmol/L (ref 96–106)
GFR calc Af Amer: 40 mL/min/{1.73_m2} — ABNORMAL LOW (ref >59)
GFR calc non Af Amer: 34 mL/min/{1.73_m2} — ABNORMAL LOW (ref >59)
GLUCOSE: 113 mg/dL — AB (ref 65–99)
Potassium: 4.7 mmol/L (ref 3.5–5.2)
SODIUM: 144 mmol/L (ref 134–144)

## 2017-05-15 ENCOUNTER — Ambulatory Visit (HOSPITAL_COMMUNITY)
Admission: RE | Admit: 2017-05-15 | Discharge: 2017-05-15 | Disposition: A | Discharge status: Home / Self Care | Payer: BLUE CROSS/BLUE SHIELD | Source: Ambulatory Visit | Attending: Cardiology | Admitting: Cardiology

## 2017-05-15 ENCOUNTER — Encounter (HOSPITAL_COMMUNITY): Payer: BLUE CROSS/BLUE SHIELD

## 2017-05-15 ENCOUNTER — Other Ambulatory Visit (HOSPITAL_COMMUNITY): Payer: Self-pay | Admitting: Unknown Physician Specialty

## 2017-05-15 ENCOUNTER — Encounter (HOSPITAL_COMMUNITY): Payer: Self-pay | Admitting: Cardiology

## 2017-05-15 VITALS — BP 126/78 | HR 82 | Wt 185.8 lb

## 2017-05-15 DIAGNOSIS — I2510 Atherosclerotic heart disease of native coronary artery without angina pectoris: Secondary | ICD-10-CM | POA: Diagnosis not present

## 2017-05-15 DIAGNOSIS — Z79899 Other long term (current) drug therapy: Secondary | ICD-10-CM | POA: Insufficient documentation

## 2017-05-15 DIAGNOSIS — E1122 Type 2 diabetes mellitus with diabetic chronic kidney disease: Secondary | ICD-10-CM | POA: Insufficient documentation

## 2017-05-15 DIAGNOSIS — Z8249 Family history of ischemic heart disease and other diseases of the circulatory system: Secondary | ICD-10-CM | POA: Diagnosis not present

## 2017-05-15 DIAGNOSIS — Z7901 Long term (current) use of anticoagulants: Secondary | ICD-10-CM | POA: Diagnosis not present

## 2017-05-15 DIAGNOSIS — Z823 Family history of stroke: Secondary | ICD-10-CM | POA: Diagnosis not present

## 2017-05-15 DIAGNOSIS — Z794 Long term (current) use of insulin: Secondary | ICD-10-CM | POA: Insufficient documentation

## 2017-05-15 DIAGNOSIS — E1143 Type 2 diabetes mellitus with diabetic autonomic (poly)neuropathy: Secondary | ICD-10-CM | POA: Insufficient documentation

## 2017-05-15 DIAGNOSIS — I130 Hypertensive heart and chronic kidney disease with heart failure and stage 1 through stage 4 chronic kidney disease, or unspecified chronic kidney disease: Secondary | ICD-10-CM | POA: Diagnosis present

## 2017-05-15 DIAGNOSIS — K5090 Crohn's disease, unspecified, without complications: Secondary | ICD-10-CM | POA: Insufficient documentation

## 2017-05-15 DIAGNOSIS — Z8371 Family history of colonic polyps: Secondary | ICD-10-CM | POA: Diagnosis not present

## 2017-05-15 DIAGNOSIS — K3184 Gastroparesis: Secondary | ICD-10-CM | POA: Diagnosis not present

## 2017-05-15 DIAGNOSIS — E785 Hyperlipidemia, unspecified: Secondary | ICD-10-CM | POA: Insufficient documentation

## 2017-05-15 DIAGNOSIS — Z86718 Personal history of other venous thrombosis and embolism: Secondary | ICD-10-CM | POA: Insufficient documentation

## 2017-05-15 DIAGNOSIS — I82499 Acute embolism and thrombosis of other specified deep vein of unspecified lower extremity: Secondary | ICD-10-CM

## 2017-05-15 DIAGNOSIS — N183 Chronic kidney disease, stage 3 unspecified: Secondary | ICD-10-CM

## 2017-05-15 DIAGNOSIS — I5022 Chronic systolic (congestive) heart failure: Secondary | ICD-10-CM | POA: Diagnosis not present

## 2017-05-15 DIAGNOSIS — I252 Old myocardial infarction: Secondary | ICD-10-CM | POA: Insufficient documentation

## 2017-05-15 LAB — BASIC METABOLIC PANEL
Anion gap: 12 (ref 5–15)
BUN: 46 mg/dL — AB (ref 6–20)
CO2: 29 mmol/L (ref 22–32)
CREATININE: 2.11 mg/dL — AB (ref 0.61–1.24)
Calcium: 9 mg/dL (ref 8.9–10.3)
Chloride: 94 mmol/L — ABNORMAL LOW (ref 101–111)
GFR calc Af Amer: 40 mL/min — ABNORMAL LOW (ref >60)
GFR, EST NON AFRICAN AMERICAN: 34 mL/min — AB (ref >60)
GLUCOSE: 369 mg/dL — AB (ref 65–99)
POTASSIUM: 3.8 mmol/L (ref 3.5–5.1)
SODIUM: 135 mmol/L (ref 135–145)

## 2017-05-15 LAB — DIGOXIN LEVEL: Digoxin Level: 0.9 ng/mL (ref 0.8–2.0)

## 2017-05-15 NOTE — Progress Notes (Signed)
LVAD Initial Psychosocial Screening  Date/Time Initiated:  05/04/2017 Referral Source:  Balinda Quails, LVAD Coordinator Referral Reason:  LVAD implant Source of Information:  Patient and chart review  Demographics Name:  [name] Address:  [place] [address] Home phone:  N/a  Cell: [phone] Marital Status:  Married  (27 years) Faith: Protestant  Primary Language:  English SS:  [id number]   DOB:  Oct 03, 1965  Medical & Follow-up Adherence to Medical regimen/INR checks: compliant  Medication adherence: compliant  Physician/Clinic Appointment Attendance: compliant     Advance Directives: Do you have a Living Will or Medical POA? Yes Patient has Living Will but no HPOA.  Would you like to complete a Living Will and Medical POA prior to surgery?  Yes would like to complete HPOA prior to surgery Do you have Goals of Care? Yes  Have you had a consult with the Palliative Care Team at [facility]? Yes  Psychological Health  Appearance:  Unremarkable Mental Status:  Alert, oriented Eye Contact:  Good Thought Content:  Coherent Speech:  Logical/coherent Mood:  Good Spirit and Positive  Affect:  Appropriate to circumstance Insight:  Good Judgement: Unimpaired Interaction Style:  Engaged and Positive  Family/Social Information Who lives in your home? Name:   RelationshipCarmell Austria   Daughter 52yo  [phone] Gardner II  Son 52yo  [phone] Dewayne Shorter 52yo   Noe Gens 52 month old Other family members/support persons in your life? Name:   Relationship:   Thomasenia Sales  [phone]  Caregiving Needs Who is the primary caregiver? [facility] status:  good Do you drive?  yes Do you work?  no Physical Limitations:  no Do you have other care giving responsibilities?  Shared responsibility for 52 month old Contact number:[phone]  Who is the secondary caregiver? [place] status:  good Do you drive?   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   Shared responsibility for 52 month old Contact number: [phone]  Home Environment/Personal Care Do you have reliable phone service? Yes  If so, what is the number?   [phone] Do you own or rent your home? Rent house Number of steps into the home?  3 steps How many levels in the home? 3 floors Assistive devices in the home? cane Electrical needs for LVAD (3 prong outlets)? yes Second hand smoke exposure in the home? no Travel distance from [facility]? 5 minutes   Community Are you active with community agencies/resources/homecare? No Agency Name: n/a Are you active in a church, synagogue, mosque or other faith based community? Yes Faith based institutions name: [employer] What other sources do you have for spiritual support? no Are you active in any clubs or social organizations? none What do you do for fun?  Hobbies?  Interests?  Watch TV Education/Work Information What is the last grade of school you completed?  College Preferred method of learning?  Written Do you have any problems with reading or writing?  No Are you currently employed?  Yes  Name of employer? 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 Over 1 year If you are not working, do you plan to return to work after VAD surgery? No Are you interested in job training or learning new skills?  No Did you serve in the [place]? No  If so, what  branch? Other n/a  Financial Information What is your source of income? Short term disability Do you have difficulty meeting your monthly expenses? No Can you budget for the monthly cost for dressing supplies post procedure? Yes  Primary Health insurance:  BC/BS Secondary Insurance:  n/a Prescription plan: BC/BS What are your prescription co-pays? varies Do you use mail order for your  prescriptions?  No Have you ever had to refuse medication due to cost?  Yes Have you applied for Medicaid?  no Have you applied for Social Security Disability (SSI)  no  Medical Information Briefly describe why you are here for evaluation: Patient reports he had an MI 14 years ago and states the damage was done and never got better. He had a defibrillator 7 years ago. Progressively worse and another MI in spring 2018. Do you have a PCP or other medical provider? [name], MD Are you able to complete your ADL's? yes Do you have a history of trauma, physical, emotional, or sexual abuse? no Do you have any family history of heart problems? Yes- father who is deceased and brother Do you smoke now or past usage? never    Do you drink alcohol now or past usage? past usage    Quit date: several months ago although was only a social drinker  Are you currently using illegal drugs or misuse of medication or past usage? never  Have you ever been treated for substance abuse? No   Mental Health History How have you been feeling in the past year? "horrible" Not good since January 2018. Have you ever had any problems with depression, anxiety or other mental health issues? "I had situational depression in the early 2000's and used to see a psychiatrist for 6 months due to Crohn's disease. "That experience is making this experience easier" Do you see a counselor, psychiatrist or therapist?  no If you are currently experiencing problems are you interested in talking with a professional? No Have you or are you taking medications for anxiety/depression or any mental health concerns?  No  Past Medications: Seroquel and Ambien What are your coping strategies under stressful situations? meditate Are there any other stressors in your life? no Have you had any past or current thoughts of suicide? No "I love me" How many hours do you sleep at night? 8 hrs How is your appetite? fine Would you be  interested in attending the LVAD support group? yes  PHQ2 Depression Scale: 0     Legal Do you currently have any legal issues/problems?  no Have you had any legal issues/problems in the past?  no Do you have a Durable POA?  no    Plan for VAD Implementation Do you know and understand what happens during the VAD surgery? Patient Verbalizes Understanding  of surgery and able to describe details What do you know about the risks and side effect associated with VAD surgery? Patient Verbalizes Understanding  of risks (infection, stroke and death) and will meet with surgeon to discuss further. Explain what will happen right after surgery: Patient Verbalizes Understanding  of OR to ICU and will be intubated What is your plan for transportation for the first 8 weeks post-surgery? (Patients are not recommended to drive post-surgery for 8 weeks)  Driver:   Harrell Gave- friend  Do you have airbags in your vehicle? Yes  There is a risk of discharging the device if the airbag were to deploy. What do you know about your diet post-surgery? Patient Verbalizes Understanding  of Heart healthy How do you plan to monitor your medications, current and future? Son and daughter assist when needed with pre pour of med box.   How do you plan to complete ADL's post-surgery?  Ask for help Will it be difficult to ask for help from your caregivers?  No  Please explain what you hope will be improved about your life as a result of receiving the LVAD?  "Hoping for less hospital stays, improved energy, extend life and ability to get around better" Please tell me your biggest concern or fear about living with the LVAD?   none How do you cope with your concerns and fears?  meditate Please explain your understanding of how their body will change? Patient understands driveline and changes.  Are you worried about these changes? Not at all Do you see any barriers to your surgery or follow-up? none  Understanding of  LVAD Patient states understanding of the following: Surgical procedures and risks, Electrical need for LVAD (3 prong outlets), Safety precautions with LVAD (water, etc.), LVAD daily self-care (dressing changes, computer check, extra supplies), Outpatient follow up (LVAD clinic appts, monitoring blood thinners) and Need for Emergency Planning  Discussed and Reviewed with Patient and Caregiver  Patient's current level of motivation to prepare for LVAD:  Highly motivated Patient's present Level of Consent for LVAD: Ready when needed   Education provided to patient/family/caregiver:   Caregiver role and responsibiltiy, Financial planning for LVAD, Role of Clinical Education officer, museum and Signs of Depression and Anxiety    CAREGIVER NOT AVAILABLE AT [place] will complete at another visit Caregiver questions Please explain what you hope will be improved about your life and loved one's life as a result of receiving the LVAD?   What is your biggest concern or fear about caregiving with an LVAD patient?   What is your plan for availability to provide care 24/7 x2 weeks post op and dressing changes ongoing?   Who is the relief/backup caregiver and what is their availability?   Preferred method of learning? Other:    Do you drive?  How do you handle stressful situations?   Do you think you can do this?  Is there anything that concerns about caregiving?   Do you provide caregiving to anyone else?   Caregiver:    Comments:   Caregiver's current level of motivation to prepare for LVAD:  Caregiver's present level of consent for LVAD:   Clinical Interventions Needed:     CSW will monitor signs and symptoms of depression and assist with adjustment to life with an LVAD. CSW will refer patient for HPOA if not completed prior to surgery if still wishing to complete. CSW encouraged attendance with the LVAD Support Group to assist further with adjustment and post implant peer support.  Clinical  Impressions/Recommendations:   Patient is a 51yo male who reports he is separated for the past 5 years after 26 years of marriage. He states he is compliant with his medical regimen. He has a Living Will but does not have a HPOA. He stated that his 3 children all live in his home and the two oldest 52yo and 52 yo will be primary and secondary caregivers. Patient also has a friend Harrell Gave who is also very supportive and will assist with driving needs.  He denies any community agency involvement although has good support from his church community. He enjoys watching TV and is not involved with any social clubs or organizations. 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 He anticipates he will have long term disability and eventually apply for SSD. He currently ha Patent examiner and denies any concerns with insurance. Patient denies any use of tobacco and was a social drinker although has not had any alcohol in 6 months. He denies any illegal drug use and has no history of trauma. He reports a history of situational depression back in 2000 and received medication and psychiatric visits for 6 months but it is resolved at this time. He has no other history of mental illness and scored a 0 on the PHQ-2. Patient hopes to reduce hospitalizations, improve energy level and extend life as a result of getting the LVAD.  Patient appears to be highly motivated and has the support needed for LVAD implantation. CSW will follow up with his children for the caregiver assessment.   Melba Coon, [place]

## 2017-05-15 NOTE — Progress Notes (Signed)
CSW met with patient in the clinic to discuss LVAD implantation. CSW completed initial psychosocial assessment but will need to meet with patient's children as caregivers to complete caregiver assessment. Patient stated he will reach out to children and tentatively set a meeting for Wednesday or Thursday of this week. CSW will await return call from patient. Raquel Sarna, [place], [place]

## 2017-05-15 NOTE — Patient Instructions (Signed)
Labs today  [name], Pharm D will follow up with you regarding Bidil  You have been referred to Dr Enrique Sack for dental evaluation  We will contact you about heart catheterization and admission to the hospital

## 2017-05-16 ENCOUNTER — Telehealth (HOSPITAL_COMMUNITY): Payer: Self-pay | Admitting: Dentistry

## 2017-05-16 ENCOUNTER — Telehealth (HOSPITAL_COMMUNITY): Payer: Self-pay | Admitting: Pharmacist

## 2017-05-16 ENCOUNTER — Encounter (HOSPITAL_COMMUNITY): Payer: Self-pay | Admitting: *Deleted

## 2017-05-16 NOTE — Progress Notes (Signed)
PCP: Dr. Ola Spurr HF Cardiology: Dr. Aundra Dubin  51 yo with history of CAD s/p anterior MI in 2010 and chronic systolic CHF (ischemic cardiomyopathy) was admitted in 3/18 with 10 days of increased dyspnea and nausea.  Patient had been stable until that time, taking Lasix 80 mg po bid. Noted that weight was rising and he was developing lower extremity edema that was spreading up to his abdomen.  He tried increasing Lasix to 80 mg tid but this did not help.  He had orthopnea.  No chest pain.  Symptoms and presentation consistent with CHF, but troponin found to be elevated at 5.11.  UOP initially poor with rise in creatinine to 2.6. He was found to have low output with co-ox 36% and was started on milrinone gtt with diuresis.  Volume status and creatinine gradually improved.  He eventually underwent cath with severe proximal RCA and mid LAD stenoses noted, these were treated with DES.   He was readmitted shortly after discharge with chest pain.  This was not thought to be ACS but he was found to be in AKI again with hyperkalemia.  Digoxin and spironolactone were stopped.  Torsemide was stopped for a couple of days.   Admitted 02/08/17-02/13/17 with acute on chronic systolic CHF, diuresed 15 pounds with IV lasix. Co ox was stable in the 70's. Also developed fever with leukocytosis, started on Vanc and Zoysn for 4 days then sent home with 4 days of Levaquin. Discharge weight was 187 pounds.   He was admitted again in 8/18 with acute on chronic systolic CHF.  Co-ox was ok, inotrope not needed.  He was diuresed and discharged.   He had repeat echo in 8/18 with EF 20-25%, RV looked ok.  CXP in 8/18 showed severe HF limitation.    He continues to be limited.  He is getting PT at home.  He is short of breath walking < 1 block on flat ground.  He is very short of breath with stairs.  Able to do ADLs still.  Stable, mild atypical chest pain.  He has PAD and has pain in his thighs with ambulation though this is not  particularly limiting.  No pedal sores/ulcers. No palpitations or lightheadedness.  We have begun LVAD workup and he has seen Dr. Prescott Gum.   Labs (6/18): K 4.5, Creatinine 2.28 Labs (4/18): K 5.7 => 4.7, creatinine 1.67 => 1.95, hgb 12.8, BNP 383 Labs (5/18): K 4.8, creatinine 2.05.  Labs (8/18): K 3.7, creatinine 1.71, hgb 10.9, LDL 54, HDL 50, hgbA1c 9.7 Labs (9/18): K 4.7, creatinine 2.14  PMH: 1. Chronic systolic CHF: Ischemic cardiomyopathy.  St Jude ICD.  - Echo (11/17) with EF 25-30%, moderate diastolic dysfunction, mild MR, mildly dilated RV with moderately decreased systolic function, peak RV-RA gradient 48 mmHg.  - Echo (3/18) with EF 25-30%, wall motion abnormalities, normal RV size and systolic function.  - Admission 3/18 with cardiogenic shock and AKI, required milrinone gtt.  - Echo (8/18) with EF 20-25%, mild LVH, moderate LV dilation, mild MR.  - CPX (8/18) with peak VO2 12.4, VE/VCO2 slope 35, RER 1.05 => severe HR limitation.  2. CAD: Anterior MI 2005, had bifurcation stenting LAD/diagonal.  Repeat cath 2010 with patent stents.  - NSTEMI 3/18: LHC with 99% ulcerated lesion proximal RCA, 95% mid LAD => PCI with DES to proximal RCA and mid LAD.  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  - Lower extremity venous dopplers (8/18): chronic DVT - V/Q scan (8/18): Normal, no  PE.  4. Crohns disease: History of colectomy.  5. GERD 6. Gastroparesis 7. HTN 8. Hyperlipidemia 9. Type II diabetes  10. CKD: Stage 3.  11. Carotid dopplers (8/18): mild bilateral stenosis.  12. PFTs (8/18): Mild restriction (no IDL on CT chest).  13. Lung nodules: CT chest 8/18 with small bilateral nodules.  14. PAD: ABIs with moderate bilateral disease in 8/18.   Social History   Social History  . Marital status: Married    Spouse name: N/A  . Number of children: N/A  . Years of education: N/A   Occupational History  . Not on file.   Social History Main Topics  . Smoking status: Never Smoker  .  Smokeless tobacco: Never Used  . Alcohol use No  . Drug use: No  . Sexual activity: No   Other Topics Concern  . Not on file   Social History Narrative  . No narrative on file   Family History  Problem Relation Age of Onset  . Colon polyps Mother   . Diabetes Mother   . Heart attack Father   . Diabetes Father   . Stroke Paternal Grandfather   . Colitis Sister   . Heart disease Brother   . Colitis Sister   . Colon cancer Neg Hx    Review of systems complete and found to be negative unless listed in HPI.    Current Outpatient Prescriptions  Medication Sig Dispense Refill  . atorvastatin (LIPITOR) 80 MG tablet Take 1 tablet (80 mg total) by mouth daily at 6 PM. 30 tablet 6  . carvedilol (COREG) 6.25 MG tablet Take 0.5 tablets (3.125 mg total) by mouth 2 (two) times daily. 60 tablet 0  . clopidogrel (PLAVIX) 75 MG tablet Take 1 tablet (75 mg total) by mouth daily. 30 tablet 6  . digoxin (LANOXIN) 0.125 MG tablet Take 1 tablet (0.125 mg total) by mouth daily. 90 tablet 3  . empagliflozin (JARDIANCE) 25 MG TABS tablet Take 25 mg by mouth daily. 30 tablet 3  . gabapentin (NEURONTIN) 300 MG capsule Take 1 capsule (300 mg total) by mouth 2 (two) times daily. 60 capsule 1  . glucose blood (CONTOUR NEXT TEST) test strip Use twice daily as directed 100 each 12  . insulin aspart (NOVOLOG) 100 UNIT/ML injection Inject 7 Units into the skin daily with supper. 1 vial 0  . insulin aspart (NOVOLOG) 100 UNIT/ML injection Inject 7 Units into the skin daily with supper. 1 vial 3  . insulin glargine (LANTUS) 100 UNIT/ML injection Inject 0.21 mLs (21 Units total) into the skin daily.    . isosorbide-hydrALAZINE (BIDIL) 20-37.5 MG tablet Take 1.5 tablets by mouth 3 (three) times daily. 135 tablet 3  . magnesium oxide (MAG-OX) 400 MG tablet Take 1 tablet (400 mg total) by mouth daily. 30 tablet 3  . metoCLOPramide (REGLAN) 5 MG tablet Take one tablet 1/2 hr before meals. 90 tablet 2  . nitroGLYCERIN  (NITROSTAT) 0.4 MG SL tablet Place 1 tablet (0.4 mg total) under the tongue every 5 (five) minutes as needed for chest pain. 25 tablet 3  . ondansetron (ZOFRAN ODT) 4 MG disintegrating tablet Take 1 tablet (4 mg total) by mouth every 6 (six) hours as needed for nausea or vomiting. 40 tablet 1  . pantoprazole (PROTONIX) 40 MG tablet Take 40 mg by mouth daily.    [name] spironolactone (ALDACTONE) 25 MG tablet Take 0.5 tablets (12.5 mg total) by mouth daily. 30 tablet 0  . torsemide (DEMADEX)  20 MG tablet Take 80 mg (4 Tabs) Daily in the AM and 80 mg (4 Tabs) Every evening. 180 tablet 3  . warfarin (COUMADIN) 5 MG tablet TAKE AS DIRECTED BY COUMADIN CLINIC 50 tablet 1   No current facility-administered medications for this encounter.    BP 126/78   Pulse 82   Wt 185 lb 12.8 oz (84.3 kg)   SpO2 97%   BMI 27.64 kg/m    Wt Readings from Last 3 Encounters:  05/15/17 185 lb 12.8 oz (84.3 kg)  05/02/17 185 lb (83.9 kg)  04/25/17 181 lb 12 oz (82.4 kg)    General: NAD Neck: JVP 7-8 cm, no thyromegaly or thyroid nodule.  Lungs: Clear to auscultation bilaterally with normal respiratory effort. CV: Lateral PMI.  Heart regular S1/S2, no S3/S4, no murmur.  No peripheral edema.  No carotid bruit.  Unable to palpate pedal pulses.  Abdomen: Soft, nontender, no hepatosplenomegaly, no distention.  Skin: Intact without lesions or rashes.  Neurologic: Alert and oriented x 3.  Psych: Normal affect. Extremities: No clubbing or cyanosis.  HEENT: Normal.   Assessment/Plan: 1. Chronic systolic CHF: Ischemic cardiomyopathy.  St Jude ICD.  NSTEMI in March 2018 with DES to LAD and RCA, complicated by cardiogenic shock, low output requiring milrinone.  Most recent echo 8/18 with EF 20-25%, moderate MR.  NYHA class IIIb-IV symptoms (fluctuate).  CPX (8/18) with severe functional impairment due to HF.  On exam, he is not significantly volume overloaded.  - Continue torsemide 80 mg bid.  BMET today.   - Continue Bidil  1.5 tabs tid today.   - Continue current Coreg - Continue digoxin, check level today.  - Continue spironolactone, BMET today.   - Not on ACEI/ARB/ARNI with CKD.  - Narrow QRS, not CRT candidate.   - I think he is nearing the need for LVAD placement.  CPX showed a severe functional impairment from HF and creatinine is worsening, likely cardiorenal.  NYHA class IIIb-IV, fluctuates.  He has seen Dr. Prescott Gum.  He still needs dental extractions.  I will plan on admitting him for RHC => he will need to come off coumadin and bridge with Lovenox. I will assess cardiac output, but most likely will try to optimize creatinine prior to LVAD by using milrinone.  After RHC, he will need dental extractions. After this, and Plavix washout, he would potentially be ready for LVAD placement.  I discussed risks/benefits of RHC with him and he agrees to proceed.  2. CKD stage III: Suspect a cardiorenal component given worsening over the last few months.  BMET today.  He may need optimization with milrinone prior to LVAD.   3. CAD: NSTEMI in 3/18. LHC with 99% ulcerated lesion proximal RCA with left to right collaterals, 95% mid LAD stenosis after mid LAD stent.  s/p PCI to RCA and LAD on 11/25/16.  No exertional chest pain.  - He should be able to stop Plavix in the setting of warfarin use relatively safely 6 months post-PCI, this would be around 05/28/17.  Need to stop Plavix for LVAD.  - Continue Coreg as above.  - Continue high intensity statin, good lipids recently.  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 Recent V/Q scan did not show acute or chronic PE.  5. Chronic N/V/D with gastroparesis: Follows with GI. Quiescent currently.   6. DM: On Jardiance, hemoglobin A1c has improved.   Followup in 2-3 weeks.  Loralie Champagne, MD 05/16/2017

## 2017-05-16 NOTE — Telephone Encounter (Signed)
Received a message that patient stated that he needed a PA for his Bidil 1.5 tabs TID. Called [facility] pharmacy who stated that it has gone through for $50 copay. If he needs assistance with copay, he can use Bidil copay card.   RX Insurance Info:  ID: [id number] BIN: [id number] PCN: [id number] GRP: Larey Dresser. Velva Harman, PharmD, BCPS, CPP Clinical Pharmacist Pager: [phone] Phone: [phone] 05/16/2017 1:13 PM

## 2017-05-16 NOTE — Progress Notes (Signed)
Discharge Progress Report  Patient Details  Name: JAYSTIN MCGARVEY MRN: [id number] Date of Birth: March 08, 1966 Referring Provider:     [place] from 01/17/2017 in [place]  Referring Provider  Candee Furbish, MD.       Number of Visits: 7  Reason for Discharge:  Early Exit:  Lack of attendance  Smoking History:  History  Smoking Status  . Never Smoker  Smokeless Tobacco  . Never Used    Diagnosis: 11/25/16 DES LAD,RCA; Chronic Systolic Heart Failure  ADL UCSD:   Initial Exercise Prescription:   Discharge Exercise Prescription (Final Exercise Prescription Changes):   Functional Capacity:   Psychological, QOL, Others - Outcomes: PHQ 2/9: Depression screen [facility] 2/9 04/18/2017 03/06/2017 01/25/2017 01/17/2017 11/10/2016  Decreased Interest 0 0 0 0 0  Down, Depressed, Hopeless 0 0 1 0 0  PHQ - 2 Score 0 0 1 0 0  Some recent data might be hidden    Quality of Life:   Personal Goals: Goals established at orientation with interventions provided to work toward goal.    Personal Goals Discharge:     Goals and Risk Factor Review    Row Name 03/20/17 1625             Core Components/Risk Factors/Patient Goals Review   Personal Goals Review Heart Failure;Lipids;Diabetes;Hypertension;Improve shortness of breath with ADL's       Review slow progress due to pt progressive nature of the disease process last exercised on 6/13          Exercise Goals and Review:   Nutrition & Weight - Outcomes:    Nutrition:   Nutrition Discharge:   Education Questionnaire Score:  Pt did not return for post assessment.  Pt completed 7 sessions, last exercise session was on 02/15/17.  Cherre Huger, BSN Cardiac and Training and development officer

## 2017-05-16 NOTE — Telephone Encounter (Signed)
05/16/2017  Called and left msg. on patients home/mobile # regarding scheduling Dental Consult w/Dr. Enrique Sack .  LRI

## 2017-05-17 ENCOUNTER — Encounter (HOSPITAL_COMMUNITY): Payer: BLUE CROSS/BLUE SHIELD

## 2017-05-17 ENCOUNTER — Encounter: Payer: Self-pay | Admitting: Licensed Clinical Social Worker

## 2017-05-18 ENCOUNTER — Encounter (HOSPITAL_COMMUNITY): Payer: Self-pay | Admitting: Dentistry

## 2017-05-18 ENCOUNTER — Ambulatory Visit (HOSPITAL_COMMUNITY)
Admission: RE | Admit: 2017-05-18 | Discharge: 2017-05-18 | Disposition: A | Discharge status: Home / Self Care | Payer: BLUE CROSS/BLUE SHIELD | Source: Ambulatory Visit | Attending: Internal Medicine | Admitting: Internal Medicine

## 2017-05-18 ENCOUNTER — Telehealth (HOSPITAL_COMMUNITY): Payer: Self-pay | Admitting: Pharmacist

## 2017-05-18 ENCOUNTER — Ambulatory Visit (HOSPITAL_COMMUNITY): Payer: Self-pay | Admitting: Dentistry

## 2017-05-18 VITALS — BP 109/62 | HR 85 | Temp 98.1°F

## 2017-05-18 DIAGNOSIS — K08409 Partial loss of teeth, unspecified cause, unspecified class: Secondary | ICD-10-CM

## 2017-05-18 DIAGNOSIS — I5022 Chronic systolic (congestive) heart failure: Secondary | ICD-10-CM | POA: Diagnosis present

## 2017-05-18 DIAGNOSIS — K0530 Chronic periodontitis, unspecified: Secondary | ICD-10-CM

## 2017-05-18 DIAGNOSIS — I255 Ischemic cardiomyopathy: Secondary | ICD-10-CM

## 2017-05-18 DIAGNOSIS — K06020 Generalized gingival recession, unspecified: Secondary | ICD-10-CM

## 2017-05-18 DIAGNOSIS — M2632 Excessive spacing of fully erupted teeth: Secondary | ICD-10-CM

## 2017-05-18 DIAGNOSIS — M264 Malocclusion, unspecified: Secondary | ICD-10-CM

## 2017-05-18 DIAGNOSIS — Z01818 Encounter for other preprocedural examination: Secondary | ICD-10-CM | POA: Diagnosis not present

## 2017-05-18 DIAGNOSIS — K0889 Other specified disorders of teeth and supporting structures: Secondary | ICD-10-CM

## 2017-05-18 DIAGNOSIS — K083 Retained dental root: Secondary | ICD-10-CM

## 2017-05-18 DIAGNOSIS — K036 Deposits [accretions] on teeth: Secondary | ICD-10-CM

## 2017-05-18 DIAGNOSIS — K045 Chronic apical periodontitis: Secondary | ICD-10-CM

## 2017-05-18 DIAGNOSIS — Z9189 Other specified personal risk factors, not elsewhere classified: Secondary | ICD-10-CM

## 2017-05-18 DIAGNOSIS — K029 Dental caries, unspecified: Secondary | ICD-10-CM

## 2017-05-18 DIAGNOSIS — R682 Dry mouth, unspecified: Secondary | ICD-10-CM

## 2017-05-18 DIAGNOSIS — K117 Disturbances of salivary secretion: Secondary | ICD-10-CM

## 2017-05-18 LAB — MAGNESIUM: MAGNESIUM: 1.8 mg/dL (ref 1.7–2.4)

## 2017-05-18 NOTE — Patient Instructions (Signed)
[facility]    Department of Dental Medicine     DR. Dagoberto Nealy      HEART VALVES AND MOUTH CARE:  FACTS:   If you have any infection in your mouth, it can infect your heart valve.  If you heart valve is infected, you will be seriously ill.  Infections in the mouth can be SILENT and do not always cause pain.  Examples of infections in the mouth are gum disease, dental cavities, and abscesses.  Some possible signs of infection are: Bad breath, bleeding gums, or teeth that are sensitive to sweets, hot, and/or cold. There are many other signs as well.  WHAT YOU HAVE TO DO:   Brush your teeth after meals and at bedtime. Spend at least 2 minutes brushing well, especially behind your back teeth and all around your teeth that stand alone. Brush at the gumline also.  Do not go to bed without brushing your teeth and flossing.  If you gums bleed when you brush or floss, do NOT stop brushing or flossing. It usually means that your gums need more attention and better cleaning.   If your Dentist or Dr. Carlia Bomkamp gave you a prescription mouthwash to use, make sure to use it as directed. If you run out of the medication, get a refill at the pharmacy.   If you were given any other medications or directions by your Dentist, please follow them. If you did not understand the directions or forget what you were told, please call. We will be happy to refresh her memory.  If you need antibiotics before dental procedures, make sure you take them one hour prior to every dental visit as directed.   Get a dental checkup every 4-6 months in order to keep your mouth healthy, or to find and treat any new infection. You will most likely need your teeth cleaned or gums treated at the same time.  If you are not able to come in for your scheduled appointment, call your Dentist as soon as possible to reschedule.  If you have a problem in between dental visits, call your Dentist.  

## 2017-05-18 NOTE — Progress Notes (Signed)
DENTAL CONSULTATION  Date of Consultation:  05/18/2017 Patient Name:   [name] Date of Birth:   [birth year] Medical Record Number: [id number]  VITALS: BP 109/62 (BP Location: Left Arm)   Pulse 85   Temp 98.1 F (36.7 C) (Oral)   CHIEF COMPLAINT: Patient referred by Dr. Aundra Dubin and Dr. Tharon Aquas Trigt for a dental consultation.   HPI: [name] is a 51 year old male with ischemic cardiomyopathy. Patient with anticipated LVAD placement.  Patient is now seen as part of a pre-LVAD placement dental protocol examination to rule out dental infection that may affect the patient's systemic health and anticipated LVAD placement.  The patient currently denies acute toothaches, swellings, or abscesses. Patient has not seen a dentist in " a  long time".  The last dentist he saw was Dr. Regino Bellow. The patient denies having partial dentures. The patient denies having dental phobia. The patient indicates that he has been unable to go to the dentist due to his health problems.  PROBLEM LIST: Patient Active Problem List   Diagnosis Date Noted  . Ischemic cardiomyopathy     Priority: High  . ACP (advance care planning)   . Goals of care, counseling/discussion   . Diarrhea   . Palliative care encounter   . Nausea   . CHF exacerbation ([place]) 04/05/2017  . Diabetic keto-acidosis ([place]) 04/05/2017  . Type 2 diabetes mellitus with complication, with long-term current use of insulin ([place]) 03/08/2017  . Acute on chronic combined systolic and diastolic CHF (congestive heart failure) ([place]) 02/08/2017  . CKD (chronic kidney disease), stage III 01/17/2017  . AKI (acute kidney injury) ([place])   . Non-ST elevation (NSTEMI) myocardial infarction ([place])   . CHF (congestive heart failure) ([place]) 09/02/2016  . Elevated serum creatinine   . Encounter for therapeutic drug monitoring 08/03/2016  . Chest pain 04/26/2012  . SVT (supraventricular tachycardia) ([place]) 04/26/2012  . Gastroparesis due to DM  ([place]) 11/18/2011  . Nausea & vomiting 11/17/2011  . Coronary artery disease   . Essential hypertension   . Automatic implantable cardioverter-defibrillator in situ   . Deep venous thrombosis ([place])   . Hypersomnolent   . POLYP, COLON 09/25/2008  . Type 2 diabetes mellitus ([place]) 09/25/2008  . Dyslipidemia, goal LDL below 70 09/25/2008  . GASTRITIS 09/25/2008  . DIVERTICULOSIS, COLON 09/25/2008  . RECTAL MASS 09/25/2008    PMH: Past Medical History:  Diagnosis Date  . Angina   . ASCVD (arteriosclerotic cardiovascular disease)    , Anterior infarction 2005, LAD diagonal bifurcation intervention 03/2004  . Automatic implantable cardiac defibrillator -St. Jude's       . Benign neoplasm of colon   . CHF (congestive heart failure) ([place])   . Chronic systolic heart failure ([place])   . Coronary artery disease     Widely patent previously placed stents in the left anterior   . Crohn's disease ([place])   . 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 Dyspnea   . Gastroparesis   . GERD (gastroesophageal reflux disease)   . High cholesterol   . Hyperlipidemia   . Hypersomnolent    Previous diagnosis of narcolepsy  . Hypertension, essential   . Ischemic cardiomyopathy    Ejection fraction 15-20% catheterization 2010  . Type II or unspecified type diabetes mellitus without mention of complication, not stated as uncontrolled   . Unspecified gastritis and gastroduodenitis without mention of hemorrhage     PSH: Past Surgical History:  Procedure Laterality  Date  . APPENDECTOMY    . CARDIAC DEFIBRILLATOR PLACEMENT  2010   St. Jude ICD  . COLECTOMY     "for Crohn's"  . COLON SURGERY    . CORONARY STENT INTERVENTION N/A 11/25/2016   Procedure: Coronary Stent Intervention;  Surgeon: Leonie Man, MD;  Location: [facility];  Service: Cardiovascular;  Laterality: N/A;  . EP IMPLANTABLE DEVICE N/A 09/14/2016   Procedure: ICD Generator Changeout;  Surgeon: Deboraha Sprang,  MD;  Location: [facility];  Service: Cardiovascular;  Laterality: N/A;  . FETAL SURGERY FOR CONGENITAL HERNIA    . INGUINAL HERNIA REPAIR    . RIGHT/LEFT HEART CATH AND CORONARY ANGIOGRAPHY N/A 11/23/2016   Procedure: Right/Left Heart Cath and Coronary Angiography;  Surgeon: Larey Dresser, MD;  Location: [facility];  Service: Cardiovascular;  Laterality: N/A;    ALLERGIES: Allergies  Allergen Reactions  . Metformin And Related Diarrhea    MEDICATIONS: Current Outpatient Prescriptions  Medication Sig Dispense Refill  . atorvastatin (LIPITOR) 80 MG tablet Take 1 tablet (80 mg total) by mouth daily at 6 PM. 30 tablet 6  . carvedilol (COREG) 6.25 MG tablet Take 0.5 tablets (3.125 mg total) by mouth 2 (two) times daily. 60 tablet 0  . clopidogrel (PLAVIX) 75 MG tablet Take 1 tablet (75 mg total) by mouth daily. 30 tablet 6  . digoxin (LANOXIN) 0.125 MG tablet Take 1 tablet (0.125 mg total) by mouth daily. 90 tablet 3  . empagliflozin (JARDIANCE) 25 MG TABS tablet Take 25 mg by mouth daily. 30 tablet 3  . gabapentin (NEURONTIN) 300 MG capsule Take 1 capsule (300 mg total) by mouth 2 (two) times daily. 60 capsule 1  . glucose blood (CONTOUR NEXT TEST) test strip Use twice daily as directed 100 each 12  . insulin aspart (NOVOLOG) 100 UNIT/ML injection Inject 7 Units into the skin daily with supper. 1 vial 0  . insulin aspart (NOVOLOG) 100 UNIT/ML injection Inject 7 Units into the skin daily with supper. 1 vial 3  . insulin glargine (LANTUS) 100 UNIT/ML injection Inject 0.21 mLs (21 Units total) into the skin daily.    . isosorbide-hydrALAZINE (BIDIL) 20-37.5 MG tablet Take 1.5 tablets by mouth 3 (three) times daily. 135 tablet 3  . magnesium oxide (MAG-OX) 400 MG tablet Take 1 tablet (400 mg total) by mouth daily. 30 tablet 3  . metoCLOPramide (REGLAN) 5 MG tablet Take one tablet 1/2 hr before meals. 90 tablet 2  . nitroGLYCERIN (NITROSTAT) 0.4 MG SL tablet Place 1 tablet (0.4  mg total) under the tongue every 5 (five) minutes as needed for chest pain. 25 tablet 3  . ondansetron (ZOFRAN ODT) 4 MG disintegrating tablet Take 1 tablet (4 mg total) by mouth every 6 (six) hours as needed for nausea or vomiting. 40 tablet 1  . pantoprazole (PROTONIX) 40 MG tablet Take 40 mg by mouth daily.    [name] spironolactone (ALDACTONE) 25 MG tablet Take 0.5 tablets (12.5 mg total) by mouth daily. 30 tablet 0  . torsemide (DEMADEX) 20 MG tablet Take 80 mg (4 Tabs) Daily in the AM and 80 mg (4 Tabs) Every evening. 180 tablet 3  . warfarin (COUMADIN) 5 MG tablet TAKE AS DIRECTED BY COUMADIN CLINIC 50 tablet 1   No current facility-administered medications for this visit.     LABS: Lab Results  Component Value Date   WBC 7.1 04/13/2017   HGB 10.9 (L) 04/13/2017   HCT 32.8 (  L) 04/13/2017   MCV 81.2 04/13/2017   PLT 257 04/13/2017      Component Value Date/Time   NA 135 05/15/2017 1253   NA 144 05/11/2017 0938   K 3.8 05/15/2017 1253   CL 94 (L) 05/15/2017 1253   CO2 29 05/15/2017 1253   GLUCOSE 369 (H) 05/15/2017 1253   BUN 46 (H) 05/15/2017 1253   BUN 68 (H) 05/11/2017 0938   CREATININE 2.11 (H) 05/15/2017 1253   CREATININE 1.63 (H) 11/09/2016 1505   CALCIUM 9.0 05/15/2017 1253   GFRNONAA 34 (L) 05/15/2017 1253   GFRNONAA 48 (L) 11/09/2016 1505   GFRAA 40 (L) 05/15/2017 1253   GFRAA 55 (L) 11/09/2016 1505   Lab Results  Component Value Date   INR 1.58 05/04/2017   INR 1.7 04/26/2017   INR 1.6 04/17/2017   No results found for: PTT  SOCIAL HISTORY: Social History   Social History  . Marital status: Married    Spouse name: N/A  . Number of children: N/A  . Years of education: N/A   Occupational History  . Not on file.   Social History Main Topics  . Smoking status: Never Smoker  . Smokeless tobacco: Never Used  . Alcohol use No  . Drug use: No  . Sexual activity: No   Other Topics Concern  . Not on file   Social History Narrative  . No narrative on  file    FAMILY HISTORY: Family History  Problem Relation Age of Onset  . Colon polyps Mother   . Diabetes Mother   . Heart attack Father   . Diabetes Father   . Stroke Paternal Grandfather   . Colitis Sister   . Heart disease Brother   . Colitis Sister   . Colon cancer Neg Hx     REVIEW OF SYSTEMS: Reviewed with the patient as per History of present illness. Psych: Patient denies having dental phobia.  DENTAL HISTORY: CHIEF COMPLAINT: Patient referred by Dr. Aundra Dubin and Dr. Tharon Aquas Trigt for a dental consultation.   HPI: Donzell E Blackwelder is a 51 year old male with ischemic cardiomyopathy. Patient with anticipated LVAD placement.  Patient is now seen as part of a pre-LVAD placement dental protocol examination to rule out dental infection that may affect the patient's systemic health and anticipated LVAD placement.  The patient currently denies acute toothaches, swellings, or abscesses. Patient has not seen a dentist in " a  long time".  The last dentist he saw was Dr. Regino Bellow. The patient denies having partial dentures. The patient denies having dental phobia. The patient indicates that he has been unable to go to the dentist due to his health problems.  DENTAL EXAMINATION: GENERAL: The patient is a well-developed well-nourished male in no acute distress. Patient ambulates with a cane to help stabilize him. HEAD AND NECK: There is no palpable neck lymphadenopathy. The patient denies acute TMJ symptoms. INTRAORAL EXAM: The patient has normal saliva. The patient has bilateral mandibular lingual tori. The patient has a deep palatal vault. There is no evidence of oral abscess formation. Multiple diastemas are noted. Multiple malpositioned teeth are noted. DENTITION: Patient with multiple missing teeth #'s 1-3, 16-18, 24-26, and 30-32. There is a retained root segment in the area of #5 and 19. PERIODONTAL: The patient has chronic periodontitis with plaque and calculus  accumulations, generalized gingival recession, generalized tooth mobility. There is moderate to severe bone loss noted. DENTAL CARIES/SUBOPTIMAL RESTORATIONS: Multiple dental caries are noted as per dental  charting form. ENDODONTIC: Patient currently denies acute pulpitis symptoms. Patient has multiple areas of periapical pathology and radiolucency. CROWN AND BRIDGE: There are crowns on tooth numbers 4, 13, and 14 that are suboptimal secondary to recurrent caries. PROSTHODONTIC: There are no partial dentures. OCCLUSION: Patient has a poor occlusal scheme secondary to multiple missing teeth, retained root segments, multiple diastemas, supra-eruption and drifting of the unopposed teeth into the edentulous areas, and lack of replacement missing teeth with dental prostheses.  RADIOGRAPHIC INTERPRETATION: An orthopantogram was taken and supplemented with a full series of dental radiographs. There are multiple missing teeth. There are multiple retained root segments. There are multiple diastemas. There is supra-eruption and drifting of the unopposed teeth into the edentulous areas. Multiple periapical radiolucencies are noted. There are previous root canal therapies associated with tooth numbers 4, 5, 13, and 14. Multiple dental caries are noted.   ASSESSMENTS: 1. Ischemic cardiomyopathy 2. Pre-LVAD placement dental protocol 3. Chronic apical periodontitis 4. Dental caries 5. Retained root segments 6. Chronic periodontitis with bone loss 7. Generalized gingival recession 8.  Accretions 9. Loose teeth 10. Multiple missing teeth 11. Multiple diastemas 12. Supra-eruption and drifting of the unopposed teeth into the edentulous areas  13. Multiple malpositioned teeth 14. Poor occlusal scheme and malocclusion 15. Risk for bleeding with invasive dental procedures due to current Plavix and warfarin therapy is 16. Risk for complications with anticipated invasive dental procedures in the operating room  up to and including death due to his overall cardiovascular and respiratory compromise.   PLAN/RECOMMENDATIONS: 1. I discussed the risks, benefits, and complications of various treatment options with the patient in relationship to his medical and dental conditions, anticipated LVAD surgery, and risk for endocarditis. We discussed various treatment options to include no treatment, multiple extractions with alveoloplasty, pre-prosthetic surgery as indicated, periodontal therapy, dental restorations, root canal therapy, crown and bridge therapy, implant therapy, and replacement of missing teeth as indicated. The patient currently wishes to proceed with extraction of remaining teeth with alveoloplasty and pre-prosthetic surgery as needed in the operative room general anesthesia. I am scheduling my dental procedures for 06/08/2017 at 7:30 AM at [facility]. Patient will need to have his Plavix and warfarin therapy discontinued to allow for admission and cardiac catheterization with Dr. Aundra Dubin to be followed by my dental operating room procedure.  Dr. Prescott Gum is to then follow-up with his LVAD procedure as his discretion the following week as time and space permits in his operating room schedule.   2. Discussion of findings with medical team and coordination of future medical and dental care as needed. The LVAD coordinator has been contacted to assist in coordination of the discontinuation of Plavix and warfarin therapy is as well as coordination of the cardiac catheterization and LVAD surgery as indicated.  I spent in excess of  120 minutes during the conduct of this consultation and >50% of this time involved direct face-to-face encounter for counseling and/or coordination of the patient's care.    Lenn Cal, DDS

## 2017-05-18 NOTE — Progress Notes (Signed)
CSW met with patient, [name] (daughter) and [name] (son) in the clinic to review the caregiver role for LVAD implant. Patient's son and daughter plan to jointly take the responsibility of primary caregiver. Zada Girt, VAD Coordinator and a current LVAD patient joined [place] for the discussion. Patient and adult children asked appropriate questions regarding LVAD implantation and recovery.  Caregiver questions Please explain what you hope will be improved about your life and loved one's life as a result of receiving the LVAD? Patient's children report "reduce his medications, hospital stays, stability, improved QOL and be able to do everything he wants to do" What is your biggest concern or fear about caregiving with an LVAD patient? Patient's daughter reports "that I am not doing it right" and Son reports no concerns.   What is your plan for availability to provide care 24/7 x2 weeks post op and dressing changes ongoing? The children will both arrange 24/7 care. Son will primarily be available as daughter works night shift but both reside at home. Who is the relief/backup caregiver and what is their availability? Children are both primary and back up  Preferred method of learning? Written and Hands on   Do you drive? yes How do you handle stressful situations? Daughter reports she internalizes and son reports care free attitude Do you think you can do this? Yes Is there anything that concerns about caregiving? none  Do you provide caregiving to anyone else? Yes, a 45 yo brother and a 41 month old (Daughter's)     Caregiver's current level of motivation to prepare for LVAD: Very motivated Caregiver's present level of consent for LVAD: Yes and ready   Patient's caregivers appear to be highly motivated and very supportive of patient's decision to pursue LVAD implantation. CSW recommends patient for LVAD implantation from a psychosocial standpoint. CSW will follow patient for supportive needs throughout  implantation and recovery. Raquel Sarna, [place], [place]

## 2017-05-19 ENCOUNTER — Encounter (HOSPITAL_COMMUNITY): Payer: BLUE CROSS/BLUE SHIELD

## 2017-05-19 ENCOUNTER — Other Ambulatory Visit (HOSPITAL_COMMUNITY): Payer: BLUE CROSS/BLUE SHIELD

## 2017-05-19 LAB — FACTOR 5 LEIDEN

## 2017-05-19 NOTE — Telephone Encounter (Signed)
Cardiac Rehab Medication Review by a Pharmacist  Does the patient  feel that his/her medications are working for him/her?  No, but he feels that we are doing the best we can for his at this time given his medical conditions.   Has the patient been experiencing any side effects to the medications prescribed?  no  Does the patient measure his/her own blood pressure or blood glucose at home?  Yes--measures his blood glucose but not blood pressure.   Does the patient have any problems obtaining medications due to transportation or finances?   no   Understanding of regimen: good Understanding of indications: good Potential of compliance: good    Pharmacist comments: Mr. Macomber is a 51 YO male who sounded in good spirits and health over the phone. He was able to tell me about his medications and give me the doses and how he takes each in the morning or evening if applicable. I feel that he has a good understanding of his medications at this time and seems to be compliant, despite the volume of medications he requires at this time. He had no questions for me other than what time his appointment is on Tuesday and no concerns about his medications currently. I did also counsel him to obtain a new bottle of his nitroglycerin as his was opened last month.   Jalene Mullet, Pharm.D. PGY1 Pharmacy Resident 05/19/2017 1:39 PM Main Pharmacy: [phone]

## 2017-05-22 ENCOUNTER — Encounter (HOSPITAL_COMMUNITY): Payer: BLUE CROSS/BLUE SHIELD

## 2017-05-23 ENCOUNTER — Encounter (HOSPITAL_COMMUNITY): Payer: Self-pay

## 2017-05-23 ENCOUNTER — Encounter (HOSPITAL_COMMUNITY): Payer: Self-pay | Admitting: Unknown Physician Specialty

## 2017-05-23 ENCOUNTER — Encounter (HOSPITAL_COMMUNITY)
Admission: RE | Admit: 2017-05-23 | Discharge: 2017-05-23 | Disposition: A | Discharge status: Home / Self Care | Payer: BLUE CROSS/BLUE SHIELD | Source: Ambulatory Visit | Attending: Cardiology | Admitting: Cardiology

## 2017-05-23 VITALS — BP 128/78 | HR 81 | Ht 68.5 in | Wt 194.7 lb

## 2017-05-23 DIAGNOSIS — I2510 Atherosclerotic heart disease of native coronary artery without angina pectoris: Secondary | ICD-10-CM | POA: Insufficient documentation

## 2017-05-23 DIAGNOSIS — I110 Hypertensive heart disease with heart failure: Secondary | ICD-10-CM | POA: Insufficient documentation

## 2017-05-23 DIAGNOSIS — Z95810 Presence of automatic (implantable) cardiac defibrillator: Secondary | ICD-10-CM | POA: Insufficient documentation

## 2017-05-23 DIAGNOSIS — Z7901 Long term (current) use of anticoagulants: Secondary | ICD-10-CM | POA: Insufficient documentation

## 2017-05-23 DIAGNOSIS — I255 Ischemic cardiomyopathy: Secondary | ICD-10-CM | POA: Insufficient documentation

## 2017-05-23 DIAGNOSIS — K219 Gastro-esophageal reflux disease without esophagitis: Secondary | ICD-10-CM | POA: Insufficient documentation

## 2017-05-23 DIAGNOSIS — G4710 Hypersomnia, unspecified: Secondary | ICD-10-CM | POA: Insufficient documentation

## 2017-05-23 DIAGNOSIS — Z79899 Other long term (current) drug therapy: Secondary | ICD-10-CM | POA: Insufficient documentation

## 2017-05-23 DIAGNOSIS — Z7902 Long term (current) use of antithrombotics/antiplatelets: Secondary | ICD-10-CM | POA: Insufficient documentation

## 2017-05-23 DIAGNOSIS — Z794 Long term (current) use of insulin: Secondary | ICD-10-CM | POA: Insufficient documentation

## 2017-05-23 DIAGNOSIS — Z86718 Personal history of other venous thrombosis and embolism: Secondary | ICD-10-CM | POA: Insufficient documentation

## 2017-05-23 DIAGNOSIS — I5022 Chronic systolic (congestive) heart failure: Secondary | ICD-10-CM | POA: Insufficient documentation

## 2017-05-23 DIAGNOSIS — K3184 Gastroparesis: Secondary | ICD-10-CM | POA: Insufficient documentation

## 2017-05-23 DIAGNOSIS — E1143 Type 2 diabetes mellitus with diabetic autonomic (poly)neuropathy: Secondary | ICD-10-CM | POA: Insufficient documentation

## 2017-05-23 DIAGNOSIS — I252 Old myocardial infarction: Secondary | ICD-10-CM | POA: Insufficient documentation

## 2017-05-23 DIAGNOSIS — Z955 Presence of coronary angioplasty implant and graft: Secondary | ICD-10-CM | POA: Insufficient documentation

## 2017-05-23 DIAGNOSIS — G47419 Narcolepsy without cataplexy: Secondary | ICD-10-CM | POA: Insufficient documentation

## 2017-05-23 DIAGNOSIS — K5090 Crohn's disease, unspecified, without complications: Secondary | ICD-10-CM | POA: Insufficient documentation

## 2017-05-23 NOTE — Progress Notes (Addendum)
[name] 51 y.o. male DOB: 09-Sep-1965 MRN: [id number]      Nutrition Note  1. Heart failure, chronic systolic (HCC)    Past Medical History:  Diagnosis Date  . Angina   . ASCVD (arteriosclerotic cardiovascular disease)    , Anterior infarction 2005, LAD diagonal bifurcation intervention 03/2004  . Automatic implantable cardiac defibrillator -St. Jude's       . Benign neoplasm of colon   . CHF (congestive heart failure) ([place])   . Chronic systolic heart failure ([place])   . Coronary artery disease     Widely patent previously placed stents in the left anterior   . Crohn's disease ([place])   . 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 Dyspnea   . Gastroparesis   . GERD (gastroesophageal reflux disease)   . High cholesterol   . Hyperlipidemia   . Hypersomnolent    Previous diagnosis of narcolepsy  . Hypertension, essential   . Ischemic cardiomyopathy    Ejection fraction 15-20% catheterization 2010  . Type II or unspecified type diabetes mellitus without mention of complication, not stated as uncontrolled   . Unspecified gastritis and gastroduodenitis without mention of hemorrhage    Meds reviewed. Jardiance, Novolog, Lantus, Reglan, Coumadin noted  HT: Ht Readings from Last 1 Encounters:  05/23/17 5' 8.5" (1.74 m)    WT: Wt Readings from Last 3 Encounters:  05/23/17 194 lb 10.7 oz (88.3 kg)  05/15/17 185 lb 12.8 oz (84.3 kg)  05/02/17 185 lb (83.9 kg)     BMI 29.2   Current tobacco use? No  Labs:  Lipid Panel     Component Value Date/Time   CHOL 128 05/04/2017 1446   TRIG 118 05/04/2017 1446   HDL 50 05/04/2017 1446   CHOLHDL 2.6 05/04/2017 1446   VLDL 24 05/04/2017 1446   LDLCALC 54 05/04/2017 1446    Lab Results  Component Value Date   HGBA1C 9.7 (H) 05/04/2017   CBG (last 3)  No results for input(s): GLUCAP in the last 72 hours.  Nutrition Note Spoke with pt. Nutrition plan and goals reviewed with pt. Pt is following Step 1 of  the Therapeutic Lifestyle Changes diet. Pt wants to lose wt. Pt is diabetic. Last A1c indicates blood glucose poorly controlled. Per discussion, pt thought his last A1c was 7.0, which is "low for me." Pt attributed his lower A1c to better CBG control due to hospitalization. Pt checks CBG's 2 times a day. Pt states he has a h/o Crohn's disease; certain foods not tolerated (e.g. Milk, yogurt, nuts). Pt expressed understanding of the information reviewed. Pt aware of nutrition education classes offered and plans on attending nutrition classes.  Nutrition Diagnosis ? Food-and nutrition-related knowledge deficit related to lack of exposure to information as related to diagnosis of: ? CVD ? DM ? Overweight related to excessive energy intake as evidenced by a BMI of 29.2  Nutrition Intervention ? Pt's individual nutrition plan and goals reviewed with pt.  Nutrition Goal(s):  ? Pt to identify and limit food sources of saturated fat, trans fat, and sodium ? Pt to identify food quantities necessary to achieve weight loss of 6-24 lb (2.7-10.9 kg) at graduation from cardiac rehab. Goal wt of 160 lb desired.  ? Improved glycemic control as evidenced by pt's A1c decrease toward 7 or less.   Plan: Pt to attend nutrition classes ? Nutrition I ? Nutrition II ? Portion Distortion ? Diabetes Blitz ? Diabetes Q & A  Will provide client-centered nutrition education as part of interdisciplinary care.   Monitor and evaluate progress toward nutrition goal with team.  Derek Mound, M.Ed, RD, LDN, CDE 05/23/2017 3:04 PM

## 2017-05-23 NOTE — Progress Notes (Signed)
Cardiac Individual Treatment Plan  Patient Details  Name: [name] MRN: [id number] Date of Birth: 06/13/1966 Referring Provider:     CARDIAC REHAB PHASE II ORIENTATION from 05/23/2017 in [place]  Referring Provider  Loralie Champagne MD      Initial Encounter Date:    CARDIAC REHAB PHASE II ORIENTATION from 05/23/2017 in [place]  Date  05/23/17  Referring Provider  Loralie Champagne MD      Visit Diagnosis: Heart failure, chronic systolic ([place])  Patient's Home Medications on Admission:  Current Outpatient Prescriptions:  .  atorvastatin (LIPITOR) 80 MG tablet, Take 1 tablet (80 mg total) by mouth daily at 6 PM., Disp: 30 tablet, Rfl: 6 .  carvedilol (COREG) 6.25 MG tablet, Take 0.5 tablets (3.125 mg total) by mouth 2 (two) times daily., Disp: 60 tablet, Rfl: 0 .  clopidogrel (PLAVIX) 75 MG tablet, Take 1 tablet (75 mg total) by mouth daily., Disp: 30 tablet, Rfl: 6 .  digoxin (LANOXIN) 0.125 MG tablet, Take 1 tablet (0.125 mg total) by mouth daily., Disp: 90 tablet, Rfl: 3 .  empagliflozin (JARDIANCE) 25 MG TABS tablet, Take 25 mg by mouth daily., Disp: 30 tablet, Rfl: 3 .  gabapentin (NEURONTIN) 300 MG capsule, Take 1 capsule (300 mg total) by mouth 2 (two) times daily., Disp: 60 capsule, Rfl: 1 .  glucose blood (CONTOUR NEXT TEST) test strip, Use twice daily as directed, Disp: 100 each, Rfl: 12 .  insulin aspart (NOVOLOG) 100 UNIT/ML injection, Inject 7 Units into the skin daily with supper., Disp: 1 vial, Rfl: 0 .  insulin glargine (LANTUS) 100 UNIT/ML injection, Inject 0.21 mLs (21 Units total) into the skin daily., Disp: , Rfl:  .  isosorbide-hydrALAZINE (BIDIL) 20-37.5 MG tablet, Take 1.5 tablets by mouth 3 (three) times daily., Disp: 135 tablet, Rfl: 3 .  magnesium oxide (MAG-OX) 400 MG tablet, Take 1 tablet (400 mg total) by mouth daily., Disp: 30 tablet, Rfl: 3 .  metoCLOPramide (REGLAN) 5 MG tablet,  Take one tablet 1/2 hr before meals., Disp: 90 tablet, Rfl: 2 .  nitroGLYCERIN (NITROSTAT) 0.4 MG SL tablet, Place 1 tablet (0.4 mg total) under the tongue every 5 (five) minutes as needed for chest pain., Disp: 25 tablet, Rfl: 3 .  ondansetron (ZOFRAN ODT) 4 MG disintegrating tablet, Take 1 tablet (4 mg total) by mouth every 6 (six) hours as needed for nausea or vomiting., Disp: 40 tablet, Rfl: 1 .  pantoprazole (PROTONIX) 40 MG tablet, Take 40 mg by mouth daily., Disp: , Rfl:  .  spironolactone (ALDACTONE) 25 MG tablet, Take 0.5 tablets (12.5 mg total) by mouth daily., Disp: 30 tablet, Rfl: 0 .  torsemide (DEMADEX) 20 MG tablet, Take 80 mg (4 Tabs) Daily in the AM and 80 mg (4 Tabs) Every evening., Disp: 180 tablet, Rfl: 3 .  warfarin (COUMADIN) 5 MG tablet, TAKE AS DIRECTED BY COUMADIN CLINIC, Disp: 50 tablet, Rfl: 1  Past Medical History: Past Medical History:  Diagnosis Date  . Angina   . ASCVD (arteriosclerotic cardiovascular disease)    , Anterior infarction 2005, LAD diagonal bifurcation intervention 03/2004  . Automatic implantable cardiac defibrillator -[employer]       . Benign neoplasm of colon   . CHF (congestive heart failure) ([place])   . Chronic systolic heart failure ([place])   . Coronary artery disease     Widely patent previously placed stents in the left anterior   . Crohn's  disease ([place])   . 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 Dyspnea   . Gastroparesis   . GERD (gastroesophageal reflux disease)   . High cholesterol   . Hyperlipidemia   . Hypersomnolent    Previous diagnosis of narcolepsy  . Hypertension, essential   . Ischemic cardiomyopathy    Ejection fraction 15-20% catheterization 2010  . Type II or unspecified type diabetes mellitus without mention of complication, not stated as uncontrolled   . Unspecified gastritis and gastroduodenitis without mention of hemorrhage     Tobacco Use: History  Smoking Status  . Never Smoker  Smokeless  Tobacco  . Never Used    Labs: Recent Review Flowsheet Data    Labs for ITP Cardiac and Pulmonary Rehab Latest Ref Rng & Units 04/09/2017 04/10/2017 04/11/2017 04/25/2017 05/04/2017   Cholestrol 0 - 200 mg/dL - - - 149 128   LDLCALC 0 - 99 mg/dL - - - 76 54   HDL >40 mg/dL - - - 51 50   Trlycerides <150 mg/dL - - - 108 118   Hemoglobin A1c 4.8 - 5.6 % - - - - 9.7(H)   PHART 7.350 - 7.450 - - - - -   PCO2ART 32.0 - 48.0 mmHg - - - - -   HCO3 20.0 - 28.0 mmol/L - - - - -   TCO2 0 - 100 mmol/L - - - - -   ACIDBASEDEF 0.0 - 2.0 mmol/L - - - - -   O2SAT % 67.9 71.5 75.2 - -      Capillary Blood Glucose: Lab Results  Component Value Date   GLUCAP 245 (H) 04/13/2017   GLUCAP 193 (H) 04/13/2017   GLUCAP 97 04/12/2017   GLUCAP 182 (H) 04/12/2017   GLUCAP 219 (H) 04/12/2017     Exercise Target Goals: Date: 05/23/17  Exercise Program Goal: Individual exercise prescription set with THRR, safety & activity barriers. Participant demonstrates ability to understand and report RPE using BORG scale, to self-measure pulse accurately, and to acknowledge the importance of the exercise prescription.  Exercise Prescription Goal: Starting with aerobic activity 30 plus minutes a day, 3 days per week for initial exercise prescription. Provide home exercise prescription and guidelines that participant acknowledges understanding prior to discharge.  Activity Barriers & Risk Stratification:     Activity Barriers & Cardiac Risk Stratification - 05/23/17 1422      Activity Barriers & Cardiac Risk Stratification   Activity Barriers Deconditioning;Muscular Weakness;Shortness of Breath;Balance Concerns;Assistive Device;Other (comment)   Comments mild hip discomfort (B)   Cardiac Risk Stratification High      6 Minute Walk:     6 Minute Walk    Row Name 01/17/17 1011 05/23/17 1527       6 Minute Walk   Phase Initial Initial    Distance 965 feet 908 feet    Walk Time 6 minutes 6 minutes    # of  Rest Breaks 0 0    MPH 1.83 1.7    METS 3.03 3.08    RPE 10 13    VO2 Peak 10.6  -    Symptoms No No    Resting HR 74 bpm 81 bpm    Resting BP 114/60 128/78    Resting Oxygen Saturation   - 100 %    Exercise Oxygen Saturation  during 6 min walk  - 98 %    Max Ex. HR 84 bpm 96 bpm    Max Ex.  BP 122/80 130/80    2 Minute Post BP 120/70 134/70       Oxygen Initial Assessment:   Oxygen Re-Evaluation:   Oxygen Discharge (Final Oxygen Re-Evaluation):   Initial Exercise Prescription:     Initial Exercise Prescription - 05/23/17 1500      Date of Initial Exercise RX and Referring Provider   Date 05/23/17   Referring Provider Loralie Champagne MD     Bike   Level 0.5   Minutes 10   METs 2.11     NuStep   Level 2   SPM 0.8   Minutes 10   METs 2     Track   Laps 7   Minutes 10   METs 2.23     Prescription Details   Frequency (times per week) 3   Duration Progress to 30 minutes of continuous aerobic without signs/symptoms of physical distress     Intensity   THRR 40-80% of Max Heartrate 67-134   Ratings of Perceived Exertion 11-13   Perceived Dyspnea 0-4     Progression   Progression Continue to progress workloads to maintain intensity without signs/symptoms of physical distress.     Resistance Training   Training Prescription Yes   Weight 2lbs   Reps 10-15      Perform Capillary Blood Glucose checks as needed.  Exercise Prescription Changes:     Exercise Prescription Changes    Row Name 01/25/17 1400 02/07/17 1200 02/15/17 1536         Response to Exercise   Blood Pressure (Admit) 122/78 120/70 118/72     Blood Pressure (Exercise) 142/78 126/72 140/80     Blood Pressure (Exit) 124/68 112/72 98/62     Heart Rate (Admit) 79 bpm 85 bpm 91 bpm     Heart Rate (Exercise) 100 bpm 113 bpm 113 bpm     Heart Rate (Exit) 79 bpm 85 bpm 90 bpm     Rating of Perceived Exertion (Exercise) 13 12 12      Comments fluid index elevated fluid index elevated  -      Duration Continue with 30 min of aerobic exercise without signs/symptoms of physical distress. Continue with 30 min of aerobic exercise without signs/symptoms of physical distress. Continue with 30 min of aerobic exercise without signs/symptoms of physical distress.     Intensity THRR unchanged THRR unchanged THRR unchanged       Progression   Progression Continue to progress workloads to maintain intensity without signs/symptoms of physical distress. Continue to progress workloads to maintain intensity without signs/symptoms of physical distress. Continue to progress workloads to maintain intensity without signs/symptoms of physical distress.     Average METs 2.1 2.5 2.6       Resistance Training   Training Prescription Yes Yes Yes     Weight 2lbs 2lbs 2lbs     Reps 10-15 10-15 10-15     Time 10 Minutes 10 Minutes 10 Minutes       Treadmill   MPH 1.8  -  -     Grade 0  -  -     Minutes 10  -  -     METs 2.38  -  -       Bike   Level 0.5 1 1      Minutes 10 10 10      METs 2.11 3.2 3.06       NuStep   Level 2 2 2      SPM 80 80 80  Minutes 10 10 10      METs 1.7 1.9 2.1       Track   Laps  - 8 9     Minutes  - 10 10     METs  - 2.39 2.57        Exercise Comments:     Exercise Comments    Row Name 01/25/17 1451 02/15/17 1643 03/20/17 1539 04/25/17 1728     Exercise Comments Pt was oriented to exercise equipment today. Pt responded well to exercise session will f/u Reviewed home exercise guidelines with patient. Patient plans to start walking 10 minutes, 3x's/day as his mode of home exercise. Exercise limited by PAD pain. Unable to review METs and goals due to frequent absences, Pt last exercise session was on 02/15/17. Pt completed 7 sessions of cardiac rehab. Pt exited program due to unknown reason.       Exercise Goals and Review:     Exercise Goals    Row Name 01/17/17 0905 05/23/17 1425           Exercise Goals   Increase Physical Activity Yes Yes       Intervention Provide advice, education, support and counseling about physical activity/exercise needs.;Develop an individualized exercise prescription for aerobic and resistive training based on initial evaluation findings, risk stratification, comorbidities and participant's personal goals. Provide advice, education, support and counseling about physical activity/exercise needs.;Develop an individualized exercise prescription for aerobic and resistive training based on initial evaluation findings, risk stratification, comorbidities and participant's personal goals.      Expected Outcomes Achievement of increased cardiorespiratory fitness and enhanced flexibility, muscular endurance and strength shown through measurements of functional capacity and personal statement of participant. Achievement of increased cardiorespiratory fitness and enhanced flexibility, muscular endurance and strength shown through measurements of functional capacity and personal statement of participant.      Increase Strength and Stamina Yes  Improve EF and be able to perform activities without fatigue and exhaustion Yes  get strong enough for LVAD surgery      Intervention Provide advice, education, support and counseling about physical activity/exercise needs.;Develop an individualized exercise prescription for aerobic and resistive training based on initial evaluation findings, risk stratification, comorbidities and participant's personal goals. Provide advice, education, support and counseling about physical activity/exercise needs.;Develop an individualized exercise prescription for aerobic and resistive training based on initial evaluation findings, risk stratification, comorbidities and participant's personal goals.      Expected Outcomes Achievement of increased cardiorespiratory fitness and enhanced flexibility, muscular endurance and strength shown through measurements of functional capacity and personal statement of  participant. Achievement of increased cardiorespiratory fitness and enhanced flexibility, muscular endurance and strength shown through measurements of functional capacity and personal statement of participant.      Able to understand and use rate of perceived exertion (RPE) scale  - Yes      Intervention  - Provide education and explanation on how to use RPE scale      Expected Outcomes  - Short Term: Able to use RPE daily in rehab to express subjective intensity level;Long Term:  Able to use RPE to guide intensity level when exercising independently      Knowledge and understanding of Target Heart Rate Range (THRR)  - Yes      Intervention  - Provide education and explanation of THRR including how the numbers were predicted and where they are located for reference      Expected Outcomes  - Long Term: Able to use THRR  to govern intensity when exercising independently;Short Term: Able to state/look up THRR;Short Term: Able to use daily as guideline for intensity in rehab      Able to check pulse independently  - Yes      Intervention  - Provide education and demonstration on how to check pulse in carotid and radial arteries.;Review the importance of being able to check your own pulse for safety during independent exercise      Expected Outcomes  - Short Term: Able to explain why pulse checking is important during independent exercise;Long Term: Able to check pulse independently and accurately      Understanding of Exercise Prescription  - Yes      Intervention  - Provide education, explanation, and written materials on patient's individual exercise prescription      Expected Outcomes  - Short Term: Able to explain program exercise prescription;Long Term: Able to explain home exercise prescription to exercise independently         Exercise Goals Re-Evaluation :     Exercise Goals Re-Evaluation    Row Name 02/21/17 1612             Exercise Goal Re-Evaluation   Exercise Goals Review Increase  Physical Activity;Increase Strenth and Stamina       Comments Pt is improving in cardiorespiratory fitness and MET levels. Pt nustep MET avg increased from 1.7 to 2.1       Expected Outcomes Pt will continue to improve in cardiorespiratory fitness            Discharge Exercise Prescription (Final Exercise Prescription Changes):     Exercise Prescription Changes - 02/15/17 1536      Response to Exercise   Blood Pressure (Admit) 118/72   Blood Pressure (Exercise) 140/80   Blood Pressure (Exit) 98/62   Heart Rate (Admit) 91 bpm   Heart Rate (Exercise) 113 bpm   Heart Rate (Exit) 90 bpm   Rating of Perceived Exertion (Exercise) 12   Duration Continue with 30 min of aerobic exercise without signs/symptoms of physical distress.   Intensity THRR unchanged     Progression   Progression Continue to progress workloads to maintain intensity without signs/symptoms of physical distress.   Average METs 2.6     Resistance Training   Training Prescription Yes   Weight 2lbs   Reps 10-15   Time 10 Minutes     Bike   Level 1   Minutes 10   METs 3.06     NuStep   Level 2   SPM 80   Minutes 10   METs 2.1     Track   Laps 9   Minutes 10   METs 2.57      Nutrition:  Target Goals: Understanding of nutrition guidelines, daily intake of sodium <1540m, cholesterol <2081m calories 30% from fat and 7% or less from saturated fats, daily to have 5 or more servings of fruits and vegetables.  Biometrics:     Pre Biometrics - 05/23/17 1619      Pre Biometrics   Height 5' 8.5" (1.74 m)   Weight 194 lb 10.7 oz (88.3 kg)   Waist Circumference 39.75 inches   Hip Circumference 42 inches   Waist to Hip Ratio 0.95 %   BMI (Calculated) 29.17   Triceps Skinfold 27 mm   % Body Fat 29.8 %   Grip Strength 31 kg   Flexibility 9 in   Single Leg Stand 0 seconds  Post Biometrics - 04/25/17 1729       Post  Biometrics   Weight 189 lb 6 oz (85.9 kg)      Nutrition Therapy Plan  and Nutrition Goals:     Nutrition Therapy & Goals - 05/23/17 1510      Nutrition Therapy   Diet Therapeutic Lifestyle Changes, Carb Modified   Drug/Food Interactions Coumadin/Vit K     Personal Nutrition Goals   Nutrition Goal Wt loss of 1-2 lb/week to a wt loss goal of 6-24 lb at graduation from [facility]. Goal wt of 160 lb desired.    Personal Goal #2 Improved glycemic control as evidenced by pt's A1c decrease toward 7 or less.    Personal Goal #3 Pt to identify and limit food sources of saturated fat, trans fat, and sodium     Intervention Plan   Intervention Prescribe, educate and counsel regarding individualized specific dietary modifications aiming towards targeted core components such as weight, hypertension, lipid management, diabetes, heart failure and other comorbidities.   Expected Outcomes Short Term Goal: Understand basic principles of dietary content, such as calories, fat, sodium, cholesterol and nutrients.;Long Term Goal: Adherence to prescribed nutrition plan.      Nutrition Discharge: Nutrition Scores:     Nutrition Assessments - 05/23/17 1510      MEDFICTS Scores   Pre Score 47      Nutrition Goals Re-Evaluation:   Nutrition Goals Re-Evaluation:   Nutrition Goals Discharge (Final Nutrition Goals Re-Evaluation):   Psychosocial: Target Goals: Acknowledge presence or absence of significant depression and/or stress, maximize coping skills, provide positive support system. Participant is able to verbalize types and ability to use techniques and skills needed for reducing stress and depression.  Initial Review & Psychosocial Screening:     Initial Psych Review & Screening - 05/23/17 1431      Initial Review   Current issues with None Identified;Current Stress Concerns   Source of Stress Concerns Chronic Illness     Family Dynamics   Good Support System? Yes   Comments upon brief assessment, no psychosocial needs identified, no interventions  necessary      Barriers   Psychosocial barriers to participate in program There are no identifiable barriers or psychosocial needs.     Screening Interventions   Interventions Encouraged to exercise      Quality of Life Scores:     Quality of Life - 05/23/17 1538      Quality of Life Scores   Health/Function Pre 13.9 %   Socioeconomic Pre 20.69 %   Psych/Spiritual Pre 17.21 %   Family Pre 24 %   GLOBAL Pre 17.56 %      PHQ-9: Recent Review Flowsheet Data    Depression screen [facility] 2/9 04/18/2017 03/06/2017 01/25/2017 01/17/2017 11/10/2016   Decreased Interest 0 0 0 0 0   Down, Depressed, Hopeless 0 0 1 0 0   PHQ - 2 Score 0 0 1 0 0     Interpretation of Total Score  Total Score Depression Severity:  1-4 = Minimal depression, 5-9 = Mild depression, 10-14 = Moderate depression, 15-19 = Moderately severe depression, 20-27 = Severe depression   Psychosocial Evaluation and Intervention:     Psychosocial Evaluation - 02/21/17 1328      Psychosocial Evaluation & Interventions   Interventions Stress management education;Relaxation education;Encouraged to exercise with the program and follow exercise prescription   Comments pt guarded with responses but feels supported by family   Continue Psychosocial Services  Follow up required by staff      Psychosocial Re-Evaluation:     Psychosocial Re-Evaluation    [place] Name 02/21/17 1329 02/21/17 1354           Psychosocial Re-Evaluation   Current issues with (P)  Current Stress Concerns;Current Anxiety/Panic Current Stress Concerns;Current Anxiety/Panic      Comments (P)  chronic illness - chf chronic illness      Interventions (P)  Relaxation education;Stress management education;Encouraged to attend Cardiac Rehabilitation for the exercise Relaxation education;Stress management education;Encouraged to attend Cardiac Rehabilitation for the exercise      Continue Psychosocial Services   - Follow up required by staff        Initial  Review   Source of Stress Concerns  - Chronic Illness         Psychosocial Discharge (Final Psychosocial Re-Evaluation):     Psychosocial Re-Evaluation - 02/21/17 1354      Psychosocial Re-Evaluation   Current issues with Current Stress Concerns;Current Anxiety/Panic   Comments chronic illness   Interventions Relaxation education;Stress management education;Encouraged to attend Cardiac Rehabilitation for the exercise   Continue Psychosocial Services  Follow up required by staff     Initial Review   Source of Stress Concerns Chronic Illness      Vocational Rehabilitation: Provide vocational rehab assistance to qualifying candidates.   Vocational Rehab Evaluation & Intervention:     Vocational Rehab - 05/23/17 1618      Initial Vocational Rehab Evaluation & Intervention   Assessment shows need for Vocational Rehabilitation No     Vocational Rehab Re-Evaulation   Comments --  [name] plans to go on disability after he has his LVAD placed      Education: Education Goals: Education classes will be provided on a weekly basis, covering required topics. Participant will state understanding/return demonstration of topics presented.  Learning Barriers/Preferences:     Learning Barriers/Preferences - 05/23/17 1422      Learning Barriers/Preferences   Learning Barriers Sight   Learning Preferences Written Material      Education Topics: Count Your Pulse:  -Group instruction provided by verbal instruction, demonstration, patient participation and written materials to support subject.  Instructors address importance of being able to find your pulse and how to count your pulse when at home without a heart monitor.  Patients get hands on experience counting their pulse with staff help and individually.   CARDIAC REHAB PHASE II EXERCISE from 02/03/2017 in [facility]  Date  02/03/17  Instruction Review Code  2- meets goals/outcomes      Heart  Attack, Angina, and Risk Factor Modification:  -Group instruction provided by verbal instruction, video, and written materials to support subject.  Instructors address signs and symptoms of angina and heart attacks.    Also discuss risk factors for heart disease and how to make changes to improve heart health risk factors.   Functional Fitness:  -Group instruction provided by verbal instruction, demonstration, patient participation, and written materials to support subject.  Instructors address safety measures for doing things around the house.  Discuss how to get up and down off the floor, how to pick things up properly, how to safely get out of a chair without assistance, and balance training.   Meditation and Mindfulness:  -Group instruction provided by verbal instruction, patient participation, and written materials to support subject.  Instructor addresses importance of mindfulness and meditation practice to help reduce stress and improve awareness.  Instructor also leads  participants through a meditation exercise.    Stretching for Flexibility and Mobility:  -Group instruction provided by verbal instruction, patient participation, and written materials to support subject.  Instructors lead participants through series of stretches that are designed to increase flexibility thus improving mobility.  These stretches are additional exercise for major muscle groups that are typically performed during regular warm up and cool down.   CARDIAC REHAB PHASE II EXERCISE from 02/03/2017 in [place]  Date  01/27/17  Instruction Review Code  2- meets goals/outcomes      Hands Only CPR:  -Group verbal, video, and participation provides a basic overview of AHA guidelines for community CPR. Role-play of emergencies allow participants the opportunity to practice calling for help and chest compression technique with discussion of AED use.   Hypertension: -Group verbal and  written instruction that provides a basic overview of hypertension including the most recent diagnostic guidelines, risk factor reduction with self-care instructions and medication management.    Nutrition I class: Heart Healthy Eating:  -Group instruction provided by PowerPoint slides, verbal discussion, and written materials to support subject matter. The instructor gives an explanation and review of the Therapeutic Lifestyle Changes diet recommendations, which includes a discussion on lipid goals, dietary fat, sodium, fiber, plant stanol/sterol esters, sugar, and the components of a well-balanced, healthy diet.   Nutrition II class: Lifestyle Skills:  -Group instruction provided by PowerPoint slides, verbal discussion, and written materials to support subject matter. The instructor gives an explanation and review of label reading, grocery shopping for heart health, heart healthy recipe modifications, and ways to make healthier choices when eating out.   Diabetes Question & Answer:  -Group instruction provided by PowerPoint slides, verbal discussion, and written materials to support subject matter. The instructor gives an explanation and review of diabetes co-morbidities, pre- and post-prandial blood glucose goals, pre-exercise blood glucose goals, signs, symptoms, and treatment of hypoglycemia and hyperglycemia, and foot care basics.   Diabetes Blitz:  -Group instruction provided by PowerPoint slides, verbal discussion, and written materials to support subject matter. The instructor gives an explanation and review of the physiology behind type 1 and type 2 diabetes, diabetes medications and rational behind using different medications, pre- and post-prandial blood glucose recommendations and Hemoglobin A1c goals, diabetes diet, and exercise including blood glucose guidelines for exercising safely.    Portion Distortion:  -Group instruction provided by PowerPoint slides, verbal discussion,  written materials, and food models to support subject matter. The instructor gives an explanation of serving size versus portion size, changes in portions sizes over the last 20 years, and what consists of a serving from each food group.   Stress Management:  -Group instruction provided by verbal instruction, video, and written materials to support subject matter.  Instructors review role of stress in heart disease and how to cope with stress positively.     Exercising on Your Own:  -Group instruction provided by verbal instruction, power point, and written materials to support subject.  Instructors discuss benefits of exercise, components of exercise, frequency and intensity of exercise, and end points for exercise.  Also discuss use of nitroglycerin and activating EMS.  Review options of places to exercise outside of rehab.  Review guidelines for sex with heart disease.   Cardiac Drugs I:  -Group instruction provided by verbal instruction and written materials to support subject.  Instructor reviews cardiac drug classes: antiplatelets, anticoagulants, beta blockers, and statins.  Instructor discusses reasons, side effects, and lifestyle considerations  for each drug class.   Cardiac Drugs II:  -Group instruction provided by verbal instruction and written materials to support subject.  Instructor reviews cardiac drug classes: angiotensin converting enzyme inhibitors (ACE-I), angiotensin II receptor blockers (ARBs), nitrates, and calcium channel blockers.  Instructor discusses reasons, side effects, and lifestyle considerations for each drug class.   Anatomy and Physiology of the Circulatory System:  Group verbal and written instruction and models provide basic cardiac anatomy and physiology, with the coronary electrical and arterial systems. Review of: AMI, Angina, Valve disease, Heart Failure, Peripheral Artery Disease, Cardiac Arrhythmia, Pacemakers, and the ICD.   CARDIAC REHAB PHASE II  EXERCISE from 02/03/2017 in [facility]  Date  02/01/17  Instruction Review Code  2- meets goals/outcomes      Other Education:  -Group or individual verbal, written, or video instructions that support the educational goals of the cardiac rehab program.   Knowledge Questionnaire Score:     Knowledge Questionnaire Score - 05/23/17 1527      Knowledge Questionnaire Score   Pre Score 23/24      Core Components/Risk Factors/Patient Goals at Admission:     Personal Goals and Risk Factors at Admission - 05/23/17 1541      Core Components/Risk Factors/Patient Goals on Admission   Improve shortness of breath with ADL's Yes   Intervention Provide education, individualized exercise plan and daily activity instruction to help decrease symptoms of SOB with activities of daily living.   Expected Outcomes Short Term: Achieves a reduction of symptoms when performing activities of daily living.   Diabetes Yes   Heart Failure Yes   Intervention Provide a combined exercise and nutrition program that is supplemented with education, support and counseling about heart failure. Directed toward relieving symptoms such as shortness of breath, decreased exercise tolerance, and extremity edema.   Expected Outcomes Improve functional capacity of life;Short term: Attendance in program 2-3 days a week with increased exercise capacity. Reported lower sodium intake. Reported increased fruit and vegetable intake. Reports medication compliance.;Short term: Daily weights obtained and reported for increase. Utilizing diuretic protocols set by physician.;Long term: Adoption of self-care skills and reduction of barriers for early signs and symptoms recognition and intervention leading to self-care maintenance.   Hypertension Yes   Intervention Provide education on lifestyle modifcations including regular physical activity/exercise, weight management, moderate sodium restriction and  increased consumption of fresh fruit, vegetables, and low fat dairy, alcohol moderation, and smoking cessation.;Monitor prescription use compliance.   Expected Outcomes Short Term: Continued assessment and intervention until BP is < 140/66m HG in hypertensive participants. < 130/871mHG in hypertensive participants with diabetes, heart failure or chronic kidney disease.;Long Term: Maintenance of blood pressure at goal levels.   Lipids Yes   Intervention Provide education and support for participant on nutrition & aerobic/resistive exercise along with prescribed medications to achieve LDL <7099mHDL >23m15m Expected Outcomes Short Term: Participant states understanding of desired cholesterol values and is compliant with medications prescribed. Participant is following exercise prescription and nutrition guidelines.;Long Term: Cholesterol controlled with medications as prescribed, with individualized exercise RX and with personalized nutrition plan. Value goals: LDL < 70mg74mL > 40 mg.      Core Components/Risk Factors/Patient Goals Review:      Goals and Risk Factor Review    Row Name 01/26/17 1257 02/21/17 1327           Core Components/Risk Factors/Patient Goals Review   Personal Goals Review Heart Failure;Lipids;Diabetes;Hypertension;Improve shortness of  breath with ADL's Heart Failure;Lipids;Diabetes;Hypertension;Improve shortness of breath with ADL's      Review Pt has completed 2 exercise sessions, will continue to monitor. slow progress due to pt progressive nature of the disease process         Core Components/Risk Factors/Patient Goals at Discharge (Final Review):      Goals and Risk Factor Review - 02/21/17 1327      Core Components/Risk Factors/Patient Goals Review   Personal Goals Review Heart Failure;Lipids;Diabetes;Hypertension;Improve shortness of breath with ADL's   Review slow progress due to pt progressive nature of the disease process      ITP Comments:      ITP Comments    Row Name 01/17/17 0820 05/23/17 1421         ITP Comments Dr. Fransico Him, Medical Director  Dr. Fransico Him, Medical Director          Comments: [name] attended orientation from 1352 to 1452 to review rules and guidelines for program. Completed 6 minute walk test,[name] used a wheelchair for stability and completed his walk test without rest breaks. Intitial ITP, and exercise prescription.  VSS. Telemetry-Sinus Rhythm with PVC's occasional couplet.  Asymptomatic. [name] will only be able to get in a week of exercise before his right heart cath, dental extraction and LVAD placement.Barnet Pall, RN,BSN 05/23/2017 4:39 PM

## 2017-05-24 ENCOUNTER — Telehealth (HOSPITAL_COMMUNITY): Payer: Self-pay | Admitting: Unknown Physician Specialty

## 2017-05-24 ENCOUNTER — Encounter (HOSPITAL_COMMUNITY): Payer: Self-pay | Admitting: Unknown Physician Specialty

## 2017-05-24 ENCOUNTER — Other Ambulatory Visit (HOSPITAL_COMMUNITY): Payer: Self-pay | Admitting: Dentistry

## 2017-05-24 MED ORDER — ENOXAPARIN SODIUM 100 MG/ML ~~LOC~~ SOLN: 90.0000 mg | Freq: Two times a day (BID) | SUBCUTANEOUS | 0 refills | Status: DC

## 2017-05-24 NOTE — Telephone Encounter (Signed)
Pt informed that he will need to stop Coumadin and Plavix on 9/28. He will need to start Lovenox injections the morning of 9/30 every 12 hours. The last dose of Lovenox will be the morning dose on 10/2. Dose is 18m and was [facility] to the [facility] on [address] A detailed letter was sent to the pt with these instructions.

## 2017-05-25 ENCOUNTER — Telehealth: Payer: Self-pay | Admitting: Cardiology

## 2017-05-25 ENCOUNTER — Ambulatory Visit (INDEPENDENT_AMBULATORY_CARE_PROVIDER_SITE_OTHER): Payer: Self-pay

## 2017-05-25 DIAGNOSIS — Z95810 Presence of automatic (implantable) cardiac defibrillator: Secondary | ICD-10-CM

## 2017-05-25 DIAGNOSIS — I5022 Chronic systolic (congestive) heart failure: Secondary | ICD-10-CM

## 2017-05-25 NOTE — Progress Notes (Signed)
EPIC Encounter for ICM Monitoring  Patient Name: [name] is a 51 y.o. male Date: 05/25/2017 Primary Care Physican: [name], MD Primary Cardiologist: [facility] Electrophysiologist: Caryl Comes Dry Weight: [name] of last weight         No call to patient since very little impedance recording.  Patient scheduled for LVAD 06/12/2017   Thoracic impedance normal since 05/03/2017 but did not record from 04/13/2017 through 05/03/2017  Prescribed dosage:  Torsemide 20 mg 4 tablets (80 mg total) twice a day.    Recommendations:  None  Follow-up plan: ICM clinic phone appointment on 06/26/2017.  .  Copy of ICM check sent to Dr. Caryl Comes.   3 month ICM trend: 05/25/2017   1 Year ICM trend:      Rosalene Billings, RN 05/25/2017 4:49 PM

## 2017-05-25 NOTE — Telephone Encounter (Signed)
Spoke with pt and reminded pt of remote transmission that is due today. Pt verbalized understanding.   

## 2017-05-26 ENCOUNTER — Other Ambulatory Visit: Payer: Self-pay | Admitting: Unknown Physician Specialty

## 2017-05-26 ENCOUNTER — Telehealth (HOSPITAL_COMMUNITY): Payer: Self-pay | Admitting: *Deleted

## 2017-05-26 NOTE — Telephone Encounter (Signed)
Called patient back and left detailed message for him to take 100 mg of torsemide (5 Tablets) for tonight only.

## 2017-05-26 NOTE — Telephone Encounter (Signed)
Have him take 100 mg of torsemide tonight.

## 2017-05-26 NOTE — Telephone Encounter (Signed)
Advanced Heart Failure Triage Encounter  Patient Name: [name]  Date of Call: 05/26/17  Problem: 3lb wt gain overnight  Patient reported 3 lbs wt gain overnight, no SOB just a little more tired today.  Take 80 of torsemide BID currently.  Plan:  Sending to Jettie Booze, NP to review  Diann Bangerter, Ivan Anchors, RN

## 2017-05-29 ENCOUNTER — Encounter (HOSPITAL_COMMUNITY): Payer: Self-pay

## 2017-05-29 ENCOUNTER — Telehealth (HOSPITAL_COMMUNITY): Payer: Self-pay | Admitting: Unknown Physician Specialty

## 2017-05-29 ENCOUNTER — Encounter (HOSPITAL_COMMUNITY)
Admission: RE | Admit: 2017-05-29 | Discharge: 2017-05-29 | Disposition: A | Discharge status: Home / Self Care | Payer: BLUE CROSS/BLUE SHIELD | Source: Ambulatory Visit | Attending: Cardiology | Admitting: Cardiology

## 2017-05-29 ENCOUNTER — Emergency Department (HOSPITAL_COMMUNITY): Payer: BLUE CROSS/BLUE SHIELD

## 2017-05-29 ENCOUNTER — Emergency Department (HOSPITAL_COMMUNITY)
Admission: EM | Admit: 2017-05-29 | Discharge: 2017-05-30 | Disposition: A | Discharge status: Home / Self Care | Payer: BLUE CROSS/BLUE SHIELD | Attending: Emergency Medicine | Admitting: Emergency Medicine

## 2017-05-29 DIAGNOSIS — R112 Nausea with vomiting, unspecified: Secondary | ICD-10-CM | POA: Diagnosis not present

## 2017-05-29 DIAGNOSIS — K3184 Gastroparesis: Secondary | ICD-10-CM | POA: Diagnosis not present

## 2017-05-29 DIAGNOSIS — R197 Diarrhea, unspecified: Secondary | ICD-10-CM | POA: Insufficient documentation

## 2017-05-29 DIAGNOSIS — K219 Gastro-esophageal reflux disease without esophagitis: Secondary | ICD-10-CM | POA: Diagnosis not present

## 2017-05-29 DIAGNOSIS — Z95810 Presence of automatic (implantable) cardiac defibrillator: Secondary | ICD-10-CM | POA: Diagnosis not present

## 2017-05-29 DIAGNOSIS — R739 Hyperglycemia, unspecified: Secondary | ICD-10-CM

## 2017-05-29 DIAGNOSIS — G4710 Hypersomnia, unspecified: Secondary | ICD-10-CM | POA: Diagnosis not present

## 2017-05-29 DIAGNOSIS — Z7901 Long term (current) use of anticoagulants: Secondary | ICD-10-CM | POA: Insufficient documentation

## 2017-05-29 DIAGNOSIS — I5022 Chronic systolic (congestive) heart failure: Secondary | ICD-10-CM | POA: Insufficient documentation

## 2017-05-29 DIAGNOSIS — Z955 Presence of coronary angioplasty implant and graft: Secondary | ICD-10-CM

## 2017-05-29 DIAGNOSIS — K5090 Crohn's disease, unspecified, without complications: Secondary | ICD-10-CM | POA: Diagnosis not present

## 2017-05-29 DIAGNOSIS — Z79899 Other long term (current) drug therapy: Secondary | ICD-10-CM | POA: Insufficient documentation

## 2017-05-29 DIAGNOSIS — Z86718 Personal history of other venous thrombosis and embolism: Secondary | ICD-10-CM | POA: Diagnosis not present

## 2017-05-29 DIAGNOSIS — E1165 Type 2 diabetes mellitus with hyperglycemia: Secondary | ICD-10-CM | POA: Diagnosis not present

## 2017-05-29 DIAGNOSIS — E1122 Type 2 diabetes mellitus with diabetic chronic kidney disease: Secondary | ICD-10-CM | POA: Insufficient documentation

## 2017-05-29 DIAGNOSIS — R531 Weakness: Secondary | ICD-10-CM | POA: Diagnosis present

## 2017-05-29 DIAGNOSIS — I252 Old myocardial infarction: Secondary | ICD-10-CM | POA: Diagnosis not present

## 2017-05-29 DIAGNOSIS — G47419 Narcolepsy without cataplexy: Secondary | ICD-10-CM | POA: Diagnosis not present

## 2017-05-29 DIAGNOSIS — I130 Hypertensive heart and chronic kidney disease with heart failure and stage 1 through stage 4 chronic kidney disease, or unspecified chronic kidney disease: Secondary | ICD-10-CM | POA: Insufficient documentation

## 2017-05-29 DIAGNOSIS — I110 Hypertensive heart disease with heart failure: Secondary | ICD-10-CM | POA: Diagnosis not present

## 2017-05-29 DIAGNOSIS — I2510 Atherosclerotic heart disease of native coronary artery without angina pectoris: Secondary | ICD-10-CM | POA: Insufficient documentation

## 2017-05-29 DIAGNOSIS — N183 Chronic kidney disease, stage 3 (moderate): Secondary | ICD-10-CM | POA: Diagnosis not present

## 2017-05-29 DIAGNOSIS — Z794 Long term (current) use of insulin: Secondary | ICD-10-CM | POA: Insufficient documentation

## 2017-05-29 DIAGNOSIS — I5020 Unspecified systolic (congestive) heart failure: Secondary | ICD-10-CM | POA: Diagnosis present

## 2017-05-29 DIAGNOSIS — E1143 Type 2 diabetes mellitus with diabetic autonomic (poly)neuropathy: Secondary | ICD-10-CM | POA: Diagnosis not present

## 2017-05-29 DIAGNOSIS — I255 Ischemic cardiomyopathy: Secondary | ICD-10-CM | POA: Diagnosis not present

## 2017-05-29 DIAGNOSIS — Z7902 Long term (current) use of antithrombotics/antiplatelets: Secondary | ICD-10-CM | POA: Diagnosis not present

## 2017-05-29 LAB — BASIC METABOLIC PANEL
Anion gap: 11 (ref 5–15)
BUN: 49 mg/dL — AB (ref 6–20)
CHLORIDE: 97 mmol/L — AB (ref 101–111)
CO2: 24 mmol/L (ref 22–32)
CREATININE: 1.88 mg/dL — AB (ref 0.61–1.24)
Calcium: 8.9 mg/dL (ref 8.9–10.3)
GFR calc Af Amer: 46 mL/min — ABNORMAL LOW (ref >60)
GFR calc non Af Amer: 39 mL/min — ABNORMAL LOW (ref >60)
GLUCOSE: 407 mg/dL — AB (ref 65–99)
Potassium: 3.4 mmol/L — ABNORMAL LOW (ref 3.5–5.1)
Sodium: 132 mmol/L — ABNORMAL LOW (ref 135–145)

## 2017-05-29 LAB — CBC
HCT: 39.6 % (ref 39.0–52.0)
Hemoglobin: 12.9 g/dL — ABNORMAL LOW (ref 13.0–17.0)
MCH: 26.1 pg (ref 26.0–34.0)
MCHC: 32.6 g/dL (ref 30.0–36.0)
MCV: 80 fL (ref 78.0–100.0)
PLATELETS: 234 10*3/uL (ref 150–400)
RBC: 4.95 MIL/uL (ref 4.22–5.81)
RDW: 14.6 % (ref 11.5–15.5)
WBC: 5.7 10*3/uL (ref 4.0–10.5)

## 2017-05-29 LAB — CBG MONITORING, ED: Glucose-Capillary: 422 mg/dL — ABNORMAL HIGH (ref 65–99)

## 2017-05-29 LAB — I-STAT TROPONIN, ED: Troponin i, poc: 0.01 ng/mL (ref 0.00–0.08)

## 2017-05-29 LAB — GLUCOSE, CAPILLARY: Glucose-Capillary: 460 mg/dL — ABNORMAL HIGH (ref 65–99)

## 2017-05-29 MED ORDER — SODIUM CHLORIDE 0.9 % IV BOLUS (SEPSIS)
500.0000 mL | INTRAVENOUS | Status: AC
  Administered 2017-05-29: 500 mL via INTRAVENOUS

## 2017-05-29 MED ORDER — INSULIN ASPART 100 UNIT/ML ~~LOC~~ SOLN
10.0000 [IU] | Freq: Once | SUBCUTANEOUS | Status: AC
  Administered 2017-05-29: 10 [IU] via SUBCUTANEOUS
  Filled 2017-05-29: qty 1

## 2017-05-29 NOTE — ED Notes (Signed)
Repeat CBG 215

## 2017-05-29 NOTE — ED Provider Notes (Signed)
[facility] DEPT Provider Note   CSN: [id number] Arrival date & time: 05/29/17  1542     History   Chief Complaint Chief Complaint  Patient presents with  . Weakness  . Hyperglycemia    HPI [name] is a 51 y.o. male.  Patient presents to the ED with a chief complaint of hyperglycemia.  He states that he was at cardiac rehab today and had his blood sugar taken.  Was told that it was high, and that since his PCP was unavailable, he should come to the ER for treatment.  He denies any new or worsening chest pain or SOB.  He reports that he had nausea, vomiting, and diarrhea over the weekend, but this has resolved now.  He states that he still feels a little run down. He denies missing any of his medications.  Denies any other symptoms currently.  There are no provoking factors.   The history is provided by the patient. No language interpreter was used.    Past Medical History:  Diagnosis Date  . Angina   . ASCVD (arteriosclerotic cardiovascular disease)    , Anterior infarction 2005, LAD diagonal bifurcation intervention 03/2004  . Automatic implantable cardiac defibrillator -[employer]. Jude's       . Benign neoplasm of colon   . CHF (congestive heart failure) ([place])   . Chronic systolic heart failure ([place])   . Coronary artery disease     Widely patent previously placed stents in the left anterior   . Crohn's disease ([place])   . 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 Dyspnea   . Gastroparesis   . GERD (gastroesophageal reflux disease)   . High cholesterol   . Hyperlipidemia   . Hypersomnolent    Previous diagnosis of narcolepsy  . Hypertension, essential   . Ischemic cardiomyopathy    Ejection fraction 15-20% catheterization 2010  . Type II or unspecified type diabetes mellitus without mention of complication, not stated as uncontrolled   . Unspecified gastritis and gastroduodenitis without mention of hemorrhage     Patient Active Problem List     Diagnosis Date Noted  . ACP (advance care planning)   . Goals of care, counseling/discussion   . Diarrhea   . Palliative care encounter   . Nausea   . CHF exacerbation ([place]) 04/05/2017  . Diabetic keto-acidosis ([place]) 04/05/2017  . Type 2 diabetes mellitus with complication, with long-term current use of insulin ([place]) 03/08/2017  . Acute on chronic combined systolic and diastolic CHF (congestive heart failure) ([place]) 02/08/2017  . CKD (chronic kidney disease), stage III 01/17/2017  . AKI (acute kidney injury) ([place])   . Non-ST elevation (NSTEMI) myocardial infarction ([place])   . CHF (congestive heart failure) ([place]) 09/02/2016  . Elevated serum creatinine   . Encounter for therapeutic drug monitoring 08/03/2016  . Chest pain 04/26/2012  . SVT (supraventricular tachycardia) ([place]) 04/26/2012  . Gastroparesis due to DM ([place]) 11/18/2011  . Nausea & vomiting 11/17/2011  . Coronary artery disease   . Ischemic cardiomyopathy   . Essential hypertension   . Automatic implantable cardioverter-defibrillator in situ   . Deep venous thrombosis ([place])   . Hypersomnolent   . POLYP, COLON 09/25/2008  . Type 2 diabetes mellitus ([place]) 09/25/2008  . Dyslipidemia, goal LDL below 70 09/25/2008  . GASTRITIS 09/25/2008  . DIVERTICULOSIS, COLON 09/25/2008  . RECTAL MASS 09/25/2008    Past Surgical History:  Procedure Laterality Date  . APPENDECTOMY    .  CARDIAC DEFIBRILLATOR PLACEMENT  2010   St. Jude ICD  . COLECTOMY     "for Crohn's"  . COLON SURGERY    . CORONARY STENT INTERVENTION N/A 11/25/2016   Procedure: Coronary Stent Intervention;  Surgeon: Leonie Man, MD;  Location: [facility];  Service: Cardiovascular;  Laterality: N/A;  . EP IMPLANTABLE DEVICE N/A 09/14/2016   Procedure: ICD Generator Changeout;  Surgeon: Deboraha Sprang, MD;  Location: [facility];  Service: Cardiovascular;  Laterality: N/A;  . FETAL SURGERY FOR CONGENITAL HERNIA    . INGUINAL HERNIA REPAIR    .  RIGHT/LEFT HEART CATH AND CORONARY ANGIOGRAPHY N/A 11/23/2016   Procedure: Right/Left Heart Cath and Coronary Angiography;  Surgeon: Larey Dresser, MD;  Location: [facility];  Service: Cardiovascular;  Laterality: N/A;       Home Medications    Prior to Admission medications   Medication Sig Start Date End Date Taking? Authorizing Provider  atorvastatin (LIPITOR) 80 MG tablet Take 1 tablet (80 mg total) by mouth daily at 6 PM. 11/28/16  Yes Tillery, Satira Mccallum, PA-C  carvedilol (COREG) 6.25 MG tablet Take 0.5 tablets (3.125 mg total) by mouth 2 (two) times daily. 02/13/17  Yes Clegg, Amy D, NP  clopidogrel (PLAVIX) 75 MG tablet Take 1 tablet (75 mg total) by mouth daily. 11/29/16  Yes Shirley Friar, PA-C  digoxin (LANOXIN) 0.125 MG tablet Take 1 tablet (0.125 mg total) by mouth daily. 12/13/16  Yes Larey Dresser, MD  empagliflozin (JARDIANCE) 25 MG TABS tablet Take 25 mg by mouth daily. 05/11/17  Yes Hensel, Jamal Collin, MD  gabapentin (NEURONTIN) 300 MG capsule Take 1 capsule (300 mg total) by mouth 2 (two) times daily. 05/10/17  Yes [name], MD  insulin aspart (NOVOLOG) 100 UNIT/ML injection Inject 7 Units into the skin daily with supper. 05/11/17  Yes Hensel, Jamal Collin, MD  insulin glargine (LANTUS) 100 UNIT/ML injection Inject 0.21 mLs (21 Units total) into the skin daily. Patient taking differently: Inject 21 Units into the skin every evening.  05/11/17  Yes Hensel, Jamal Collin, MD  isosorbide-hydrALAZINE (BIDIL) 20-37.5 MG tablet Take 1.5 tablets by mouth 3 (three) times daily. 04/25/17  Yes Larey Dresser, MD  magnesium oxide (MAG-OX) 400 MG tablet Take 1 tablet (400 mg total) by mouth daily. 05/05/17  Yes Larey Dresser, MD  metoCLOPramide (REGLAN) 5 MG tablet Take one tablet 1/2 hr before meals. Patient taking differently: Take by mouth 3 (three) times daily before meals.  01/10/17  Yes Esterwood, Amy S, PA-C  nitroGLYCERIN (NITROSTAT) 0.4 MG SL tablet  Place 1 tablet (0.4 mg total) under the tongue every 5 (five) minutes as needed for chest pain. 07/13/16  Yes Jerline Pain, MD  ondansetron (ZOFRAN ODT) 4 MG disintegrating tablet Take 1 tablet (4 mg total) by mouth every 6 (six) hours as needed for nausea or vomiting. 01/10/17  Yes Esterwood, Amy S, PA-C  pantoprazole (PROTONIX) 40 MG tablet Take 40 mg by mouth daily.   Yes [provider]  spironolactone (ALDACTONE) 25 MG tablet Take 0.5 tablets (12.5 mg total) by mouth daily. 04/14/17  Yes Carlyle Dolly, MD  torsemide (DEMADEX) 20 MG tablet Take 80 mg (4 Tabs) Daily in the AM and 80 mg (4 Tabs) Every evening. 05/11/17  Yes Hensel, Jamal Collin, MD  warfarin (COUMADIN) 5 MG tablet TAKE AS DIRECTED BY COUMADIN CLINIC Patient taking differently: Take 5-7.5 mg by mouth See admin  instructions. 34m on Sun/Mon/Wed/Fri and 7.567mon Tues/Thurs/Sat. TAKE AS DIRECTED BY COUMADIN CLINIC 04/20/17  Yes KlDeboraha SprangMD  enoxaparin (LOVENOX) 100 MG/ML injection Inject 0.9 mLs (90 mg total) into the skin every 12 (twelve) hours. 05/24/17   McLarey DresserMD  glucose blood (CONTOUR NEXT TEST) test strip Use twice daily as directed 04/24/17   HeZenia ResidesMD    Family History Family History  Problem Relation Age of Onset  . Colon polyps Mother   . Diabetes Mother   . Heart attack Father   . Diabetes Father   . Stroke Paternal Grandfather   . Colitis Sister   . Heart disease Brother   . Colitis Sister   . Colon cancer Neg Hx     Social History Social History  Substance Use Topics  . Smoking status: Never Smoker  . Smokeless tobacco: Never Used  . Alcohol use No     Allergies   Metformin and related   Review of Systems Review of Systems  All other systems reviewed and are negative.    Physical Exam Updated Vital Signs BP 111/84 (BP Location: Right Arm)   Pulse 79   Temp 98.3 F (36.8 C) (Oral)   Resp 18   Ht 5' 8.5" (1.74 m)   Wt 88 kg (194 lb)   SpO2 99%   BMI  29.07 kg/m   Physical Exam  Constitutional: He is oriented to person, place, and time. He appears well-developed and well-nourished.  HENT:  Head: Normocephalic and atraumatic.  Eyes: Pupils are equal, round, and reactive to light. Conjunctivae and EOM are normal. Right eye exhibits no discharge. Left eye exhibits no discharge. No scleral icterus.  Neck: Normal range of motion. Neck supple. No JVD present.  Cardiovascular: Normal rate, regular rhythm and normal heart sounds.  Exam reveals no gallop and no friction rub.   No murmur heard. Pulmonary/Chest: Effort normal and breath sounds normal. No respiratory distress. He has no wheezes. He has no rales. He exhibits no tenderness.  Abdominal: Soft. He exhibits no distension and no mass. There is no tenderness. There is no rebound and no guarding.  Musculoskeletal: Normal range of motion. He exhibits no edema or tenderness.  Neurological: He is alert and oriented to person, place, and time.  Skin: Skin is warm and dry.  Psychiatric: He has a normal mood and affect. His behavior is normal. Judgment and thought content normal.  Nursing note and vitals reviewed.    ED Treatments / Results  Labs (all labs ordered are listed, but only abnormal results are displayed) Labs Reviewed  BASIC METABOLIC PANEL - Abnormal; Notable for the following:       Result Value   Sodium 132 (*)    Potassium 3.4 (*)    Chloride 97 (*)    Glucose, Bld 407 (*)    BUN 49 (*)    Creatinine, Ser 1.88 (*)    GFR calc non Af Amer 39 (*)    GFR calc Af Amer 46 (*)    All other components within normal limits  CBC - Abnormal; Notable for the following:    Hemoglobin 12.9 (*)    All other components within normal limits  CBG MONITORING, ED - Abnormal; Notable for the following:    Glucose-Capillary 422 (*)    All other components within normal limits  I-STAT TROPONIN, ED    EKG  EKG Interpretation  Date/Time:  Monday May 29 2017 16:25:32  EDT  Ventricular Rate:  84 PR Interval:  174 QRS Duration: 94 QT Interval:  382 QTC Calculation: 451 R Axis:   29 Text Interpretation:  Sinus rhythm with occasional Premature ventricular complexes Possible Left atrial enlargement Low voltage QRS Septal infarct , age undetermined Abnormal ECG No acute changes Confirmed by Brantley Stage [phone]) on 05/29/2017 9:56:37 PM       Radiology Dg Chest 2 View  Result Date: 05/29/2017 CLINICAL DATA:  Ischemic cardiomyopathy, coronary artery disease with stent placement. No acute complaints. History of weakness. EXAM: CHEST  2 VIEW COMPARISON:  Chest x-ray of May 05, 2017 FINDINGS: The lungs are adequately inflated and clear. The heart is top-normal in size. The central pulmonary vascularity is prominent but stable. The ICD is in stable position. There is no pleural effusion. The bony thorax exhibits no acute abnormality. IMPRESSION: There is no acute cardiopulmonary disease. Electronically Signed   By: David  Martinique M.D.   On: 05/29/2017 17:06    Procedures Procedures (including critical care time)  Medications Ordered in ED Medications  sodium chloride 0.9 % bolus 500 mL (not administered)  insulin aspart (novoLOG) injection 10 Units (not administered)     Initial Impression / Assessment and Plan / ED Course  I have reviewed the triage vital signs and the nursing notes.  Pertinent labs & imaging results that were available during my care of the patient were reviewed by me and considered in my medical decision making (see chart for details).     Patient with hyperglycemia.  Was at cardiac rehab today and was notified of this.  Was told to come to the ER for treatment.  No new chest pain or SOB.  Will give 500 ml fluid bolus and 10 units subq insulin.  Will need to follow-up with his PCP.  Anticipate discharge.  11:54 PM Repeat CBG is 215, not crossing in EPIC, but verified with RN.    Patient feels improved.  I believe him stable for  discharge and outpatient follow-up.  Patient understands and agrees with the plan.  Final Clinical Impressions(s) / ED Diagnoses   Final diagnoses:  Hyperglycemia    New Prescriptions New Prescriptions   No medications on file     Montine Circle, Hershal Coria 05/29/17 2356    Forde Dandy, MD 05/30/17 0002

## 2017-05-29 NOTE — ED Triage Notes (Signed)
Pt. Was sent [facility] [place] [facility] with hyperglycemia.  He is scheduled for LVAD on October 8th and was at Card[facility]hab .  He has been weak and  Not feeling well over the weekend.  Has had n/v/d .  Pt. Is alert and oriented X4. Skin is pale and warm.  ECG and Labs completed in triage.  Pt. Denies any chest pain.

## 2017-05-29 NOTE — Progress Notes (Signed)
Incomplete Session Note  Patient Details  Name: [name] MRN: [id number] Date of Birth: [birth year] Referring Provider:     CARDIAC REHAB PHASE II ORIENTATION from 05/23/2017 in [facility]  Referring Provider  Loralie Champagne MD      [name] did not complete his rehab session.  CBG 460. No exercise per protocol. [name] says he has not checked his CBG since Saturday it was 190 at the time. Blood pressure 98/60. Oxygen saturation 99% on room air. Patient reports generally feeling bad otherwise no other complaints voiced. Will take the patient to the ED for further evaluation. No exercise per protocol. The heart failure clinic called and notified about today's CBG. I attempted to call [facility] before taking the patient to the ED but was unable to get through on the phone. [name] knows not to return until his CBG's are better controlled. Patient states understanding.Barnet Pall, RN,BSN 05/29/2017 4:15 PM

## 2017-05-29 NOTE — Telephone Encounter (Signed)
Received approval for Heartmate 3 from [employer]. [name] [id number]. We will need to send updates to [facility] on October 11th at [phone].  [name] RN, BSN VAD Team Pager [phone] (7am - 7am)

## 2017-05-30 LAB — CBG MONITORING, ED: Glucose-Capillary: 215 mg/dL — ABNORMAL HIGH (ref 65–99)

## 2017-05-31 ENCOUNTER — Telehealth: Payer: Self-pay | Admitting: *Deleted

## 2017-05-31 ENCOUNTER — Encounter (HOSPITAL_COMMUNITY)
Admission: RE | Admit: 2017-05-31 | Discharge: 2017-05-31 | Disposition: A | Discharge status: Home / Self Care | Payer: BLUE CROSS/BLUE SHIELD | Source: Ambulatory Visit | Attending: Cardiology | Admitting: Cardiology

## 2017-05-31 DIAGNOSIS — I110 Hypertensive heart disease with heart failure: Secondary | ICD-10-CM | POA: Diagnosis not present

## 2017-05-31 DIAGNOSIS — I5022 Chronic systolic (congestive) heart failure: Secondary | ICD-10-CM

## 2017-05-31 DIAGNOSIS — Z955 Presence of coronary angioplasty implant and graft: Secondary | ICD-10-CM

## 2017-05-31 LAB — GLUCOSE, CAPILLARY
GLUCOSE-CAPILLARY: 51 mg/dL — AB (ref 65–99)
Glucose-Capillary: 151 mg/dL — ABNORMAL HIGH (ref 65–99)
Glucose-Capillary: 65 mg/dL (ref 65–99)
Glucose-Capillary: 125 mg/dL — ABNORMAL HIGH (ref 65–99)

## 2017-05-31 NOTE — Telephone Encounter (Signed)
[name], RN with Cardiac Rehab called reporting patient's blood sugar was 51 taken about 15 minutes ago, glucose gel was given.  Recheck blood sugar was 65. She was going to give patient Lemonade and graham crackers. On Monday 05/29/17 patient was sent to the ED for blood sugar of 460.  She was going to keep patient until his blood sugar is above 85.  Repeat blood sugar 125. Appt with PCP on 06/06/17 for DM follow up.  Derl Barrow, RN

## 2017-05-31 NOTE — Progress Notes (Addendum)
Daily Session Note  Patient Details  Name: KIAAN OVERHOLSER MRN: [id number] Date of Birth: 1966-07-31 Referring Provider:     CARDIAC REHAB PHASE II ORIENTATION from 05/23/2017 in [place]  Referring Provider  Loralie Champagne MD      Encounter Date: 05/31/2017  Check In:     Session Check In - 05/31/17 1606      Check-In   Location [facility]   Staff Present Luetta Nutting Fair, MS, ACSM RCEP, Exercise Physiologist;Joann Rion, RN, Deland Pretty, MS, ACSM CEP, Exercise Physiologist;Jovann Luse, RN, BSN   Supervising physician immediately available to respond to emergencies [facility] immediately available   Physician(s) Dr. Wendee Beavers    Medication changes reported     No   Fall or balance concerns reported    No   Tobacco Cessation No Change   Warm-up and Cool-down Performed as group-led instruction   Resistance Training Performed No   VAD Patient? No     Pain Assessment   Currently in Pain? No/denies      Capillary Blood Glucose: Results for orders placed or performed during the hospital encounter of 05/31/17 (from the past 24 hour(s))  Glucose, capillary     Status: Abnormal   Collection Time: 05/31/17  3:05 PM  Result Value Ref Range   Glucose-Capillary 151 (H) 65 - 99 mg/dL  Glucose, capillary     Status: Abnormal   Collection Time: 05/31/17  3:48 PM  Result Value Ref Range   Glucose-Capillary 51 (L) 65 - 99 mg/dL  Glucose, capillary     Status: None   Collection Time: 05/31/17  4:14 PM  Result Value Ref Range   Glucose-Capillary 65 65 - 99 mg/dL  Glucose, capillary     Status: Abnormal   Collection Time: 05/31/17  4:36 PM  Result Value Ref Range   Glucose-Capillary 125 (H) 65 - 99 mg/dL      History  Smoking Status  . Never Smoker  Smokeless Tobacco  . Never Used    Goals Met:  No report of cardiac concerns or symptoms  Goals Unmet:  CBG  Comments: [name] completed his first day of exercise  at cardiac rehab without complaints or symptoms. Post exercise CBG 51. Patient given glucose gel. Repeat CBG 65. Dr Blane Ohara office called and notified about today's hypoglycemic event. I spoke with Latina Craver RN. We will hold off on any further exercise sessions at this time due to [name]'s CBG's. [name] said he ate lunch today at around 11:30 and took his medications and insulin as prescribed.The heart failure clinic was called and notified about today's events.i spoke with [place]. Appointment made by Elwin Sleight RN for [name] to see Dr Ola Spurr on Tuesday at 0910 AM. As of today we are discharging [place] from cardiac rehab.Barnet Pall, RN,BSN 05/31/2017 4:57 PM    Dr. Fransico Him is Medical Director for Cardiac Rehab at [facility].

## 2017-06-02 ENCOUNTER — Encounter (HOSPITAL_COMMUNITY): Payer: BLUE CROSS/BLUE SHIELD

## 2017-06-05 ENCOUNTER — Encounter (HOSPITAL_COMMUNITY): Payer: BLUE CROSS/BLUE SHIELD

## 2017-06-06 ENCOUNTER — Encounter: Payer: Self-pay | Admitting: Internal Medicine

## 2017-06-06 ENCOUNTER — Ambulatory Visit (INDEPENDENT_AMBULATORY_CARE_PROVIDER_SITE_OTHER): Payer: BLUE CROSS/BLUE SHIELD | Admitting: Internal Medicine

## 2017-06-06 VITALS — BP 122/68 | HR 84 | Temp 97.9°F | Wt 193.0 lb

## 2017-06-06 DIAGNOSIS — Z23 Encounter for immunization: Secondary | ICD-10-CM

## 2017-06-06 DIAGNOSIS — Z794 Long term (current) use of insulin: Secondary | ICD-10-CM | POA: Diagnosis not present

## 2017-06-06 DIAGNOSIS — E118 Type 2 diabetes mellitus with unspecified complications: Secondary | ICD-10-CM | POA: Diagnosis not present

## 2017-06-06 NOTE — Progress Notes (Signed)
[name] Progress Note  Subjective:  [name] is a 51 y.o. male with history of CHF (EF 20-25%), CAD, HLD, HTN, and T2DM who presents to follow-up diabetes. Of note, he is to have LVAD procedure next week with teeth extractions and heart cath this week.  #Diabetes: - Patient complains of wide fluctuations in his blood sugar - He does not yet have the continuous glucometer - Currently taking lantus 21U and novolog 7U with his largest meal of the day. Jardiance was increased from 10 mg to 25 mg on 9/6 (has CKD but GFR > 45) - He is eating 3 meals a day now, compared to sporadically at last visit. Meals always have some protein and vegetable.   - He went to the ED 9/24 for CBGs in the 400s. Initial CBG was 422. He was given 10 U and fluids with improvement to 215.  - Was not allowed to complete cardiac rehab 9/26 due to hypoglycemia to 51 after exercise - Reports CBG of 507 upon waking recently, compared to 178 this morning - Says yesterday afternoon before he went to the [place] his CBG was 151, 99 walking around at the fair, and then was 476 when he went home after having "just 1 fried mushroom" at the fair.  - He had seen an endocrinologist in the past (Dr. Loanne Drilling) for management of his CBGs but "not in years" ROS: No vomiting, no abdominal pain  Allergies  Allergen Reactions  . Metformin And Related Diarrhea    Objective: Blood pressure 122/68, pulse 84, temperature 97.9 F (36.6 C), temperature source Oral, weight 193 lb (87.5 kg), SpO2 96 %. Constitutional: Thin male, appears older than stated age HENT: MMM, poor dentition Cardiovascular: RRR, S1, S2, no m/r/g.  Pulmonary/Chest: Effort normal and breath sounds normal.  Abdominal: Soft. +BS, NT Musculoskeletal: No LE edema Neurological: AOx3, no focal deficits. Skin: Skin is warm and dry. No rash noted. No erythema.  Psychiatric: Normal mood and affect.  Vitals reviewed  Assessment/Plan: Type 2  diabetes mellitus ([place]) - Patient with brittle diabetes. Based on response to 10 U at ED visit, may benefit from a moderate sliding scale. Suggested this but patient to be admitted over the next couple of weeks, so will be able to monitor his response to sliding scale while there and titrate as needed. Based on his current needs his insulin sensitivity index is roughly 60 per unit (1800 divided by 30), but this does not seem consistent with how high his sugars are running.  - Will check on his CBGs while hospitalized - May benefit from Endocrinology consult and consideration of insulin pump - To follow-up when out of the hospital with Dr. Valentina Lucks to continue process of getting continuous insulin monitor  Follow-up once out of the hospital.  [name], MD [place], PGY-3

## 2017-06-06 NOTE — Patient Instructions (Signed)
[name],  I would recommend continuing checking your blood sugars 4 times daily (with all 3 meals and before bed). Please use the following correction scale for novolog:  CBG < 70: implement hypoglycemia protocol   CBG 70 - 120: 0 units   CBG 121 - 150: 2 units   CBG 151 - 200: 3 units   CBG 201 - 250: 5 units   CBG 251 - 300: 8 units   CBG 301 - 350: 11 units   CBG 351 - 400: 15 units   CBG > 400 call MD and obtain STAT lab verification    Continue lantus 21 units daily.  Please follow-up with Dr. Valentina Lucks after your procedure.   Best, Dr. Ola Spurr

## 2017-06-07 ENCOUNTER — Inpatient Hospital Stay (HOSPITAL_COMMUNITY)
Admission: AD | Admit: 2017-06-07 | Discharge: 2017-07-03 | DRG: 001 | Disposition: A | Discharge status: Home Health | Payer: BLUE CROSS/BLUE SHIELD | Source: Ambulatory Visit | Attending: Cardiology | Admitting: Cardiology

## 2017-06-07 ENCOUNTER — Encounter (HOSPITAL_COMMUNITY)
Admission: AD | Disposition: A | Discharge status: Home Health | Payer: Self-pay | Source: Ambulatory Visit | Attending: Cardiology

## 2017-06-07 ENCOUNTER — Encounter (HOSPITAL_COMMUNITY): Payer: BLUE CROSS/BLUE SHIELD

## 2017-06-07 ENCOUNTER — Telehealth: Payer: Self-pay | Admitting: Licensed Clinical Social Worker

## 2017-06-07 ENCOUNTER — Inpatient Hospital Stay (HOSPITAL_COMMUNITY): Payer: BLUE CROSS/BLUE SHIELD

## 2017-06-07 DIAGNOSIS — D631 Anemia in chronic kidney disease: Secondary | ICD-10-CM | POA: Diagnosis present

## 2017-06-07 DIAGNOSIS — Z95810 Presence of automatic (implantable) cardiac defibrillator: Secondary | ICD-10-CM

## 2017-06-07 DIAGNOSIS — E1143 Type 2 diabetes mellitus with diabetic autonomic (poly)neuropathy: Secondary | ICD-10-CM | POA: Diagnosis present

## 2017-06-07 DIAGNOSIS — Z6830 Body mass index (BMI) 30.0-30.9, adult: Secondary | ICD-10-CM

## 2017-06-07 DIAGNOSIS — I5043 Acute on chronic combined systolic (congestive) and diastolic (congestive) heart failure: Secondary | ICD-10-CM | POA: Diagnosis present

## 2017-06-07 DIAGNOSIS — R570 Cardiogenic shock: Secondary | ICD-10-CM | POA: Diagnosis not present

## 2017-06-07 DIAGNOSIS — I5023 Acute on chronic systolic (congestive) heart failure: Secondary | ICD-10-CM

## 2017-06-07 DIAGNOSIS — I361 Nonrheumatic tricuspid (valve) insufficiency: Secondary | ICD-10-CM | POA: Diagnosis not present

## 2017-06-07 DIAGNOSIS — K219 Gastro-esophageal reflux disease without esophagitis: Secondary | ICD-10-CM | POA: Diagnosis present

## 2017-06-07 DIAGNOSIS — E1151 Type 2 diabetes mellitus with diabetic peripheral angiopathy without gangrene: Secondary | ICD-10-CM | POA: Diagnosis present

## 2017-06-07 DIAGNOSIS — E1165 Type 2 diabetes mellitus with hyperglycemia: Secondary | ICD-10-CM | POA: Diagnosis not present

## 2017-06-07 DIAGNOSIS — I255 Ischemic cardiomyopathy: Secondary | ICD-10-CM | POA: Diagnosis present

## 2017-06-07 DIAGNOSIS — I130 Hypertensive heart and chronic kidney disease with heart failure and stage 1 through stage 4 chronic kidney disease, or unspecified chronic kidney disease: Principal | ICD-10-CM | POA: Diagnosis present

## 2017-06-07 DIAGNOSIS — Z992 Dependence on renal dialysis: Secondary | ICD-10-CM

## 2017-06-07 DIAGNOSIS — N170 Acute kidney failure with tubular necrosis: Secondary | ICD-10-CM | POA: Diagnosis not present

## 2017-06-07 DIAGNOSIS — E871 Hypo-osmolality and hyponatremia: Secondary | ICD-10-CM | POA: Diagnosis not present

## 2017-06-07 DIAGNOSIS — K029 Dental caries, unspecified: Secondary | ICD-10-CM | POA: Diagnosis present

## 2017-06-07 DIAGNOSIS — R0602 Shortness of breath: Secondary | ICD-10-CM

## 2017-06-07 DIAGNOSIS — N186 End stage renal disease: Secondary | ICD-10-CM

## 2017-06-07 DIAGNOSIS — D6851 Activated protein C resistance: Secondary | ICD-10-CM | POA: Diagnosis present

## 2017-06-07 DIAGNOSIS — N184 Chronic kidney disease, stage 4 (severe): Secondary | ICD-10-CM | POA: Diagnosis not present

## 2017-06-07 DIAGNOSIS — E876 Hypokalemia: Secondary | ICD-10-CM | POA: Diagnosis present

## 2017-06-07 DIAGNOSIS — I97130 Postprocedural heart failure following cardiac surgery: Secondary | ICD-10-CM | POA: Diagnosis not present

## 2017-06-07 DIAGNOSIS — N179 Acute kidney failure, unspecified: Secondary | ICD-10-CM | POA: Diagnosis not present

## 2017-06-07 DIAGNOSIS — I480 Paroxysmal atrial fibrillation: Secondary | ICD-10-CM | POA: Diagnosis not present

## 2017-06-07 DIAGNOSIS — K045 Chronic apical periodontitis: Secondary | ICD-10-CM | POA: Diagnosis present

## 2017-06-07 DIAGNOSIS — I252 Old myocardial infarction: Secondary | ICD-10-CM | POA: Diagnosis not present

## 2017-06-07 DIAGNOSIS — K5010 Crohn's disease of large intestine without complications: Secondary | ICD-10-CM | POA: Diagnosis present

## 2017-06-07 DIAGNOSIS — T859XXA Unspecified complication of internal prosthetic device, implant and graft, initial encounter: Secondary | ICD-10-CM

## 2017-06-07 DIAGNOSIS — D62 Acute posthemorrhagic anemia: Secondary | ICD-10-CM | POA: Diagnosis not present

## 2017-06-07 DIAGNOSIS — K0530 Chronic periodontitis, unspecified: Secondary | ICD-10-CM | POA: Diagnosis present

## 2017-06-07 DIAGNOSIS — E1122 Type 2 diabetes mellitus with diabetic chronic kidney disease: Secondary | ICD-10-CM | POA: Diagnosis present

## 2017-06-07 DIAGNOSIS — E118 Type 2 diabetes mellitus with unspecified complications: Secondary | ICD-10-CM

## 2017-06-07 DIAGNOSIS — Z7189 Other specified counseling: Secondary | ICD-10-CM | POA: Diagnosis not present

## 2017-06-07 DIAGNOSIS — Y95 Nosocomial condition: Secondary | ICD-10-CM | POA: Diagnosis present

## 2017-06-07 DIAGNOSIS — Z419 Encounter for procedure for purposes other than remedying health state, unspecified: Secondary | ICD-10-CM

## 2017-06-07 DIAGNOSIS — I5022 Chronic systolic (congestive) heart failure: Secondary | ICD-10-CM | POA: Diagnosis present

## 2017-06-07 DIAGNOSIS — Z01810 Encounter for preprocedural cardiovascular examination: Secondary | ICD-10-CM | POA: Diagnosis not present

## 2017-06-07 DIAGNOSIS — Z452 Encounter for adjustment and management of vascular access device: Secondary | ICD-10-CM

## 2017-06-07 DIAGNOSIS — J189 Pneumonia, unspecified organism: Secondary | ICD-10-CM | POA: Diagnosis not present

## 2017-06-07 DIAGNOSIS — Z86718 Personal history of other venous thrombosis and embolism: Secondary | ICD-10-CM

## 2017-06-07 DIAGNOSIS — K0889 Other specified disorders of teeth and supporting structures: Secondary | ICD-10-CM | POA: Diagnosis present

## 2017-06-07 DIAGNOSIS — Z833 Family history of diabetes mellitus: Secondary | ICD-10-CM

## 2017-06-07 DIAGNOSIS — Z7901 Long term (current) use of anticoagulants: Secondary | ICD-10-CM

## 2017-06-07 DIAGNOSIS — R918 Other nonspecific abnormal finding of lung field: Secondary | ICD-10-CM | POA: Diagnosis present

## 2017-06-07 DIAGNOSIS — I2510 Atherosclerotic heart disease of native coronary artery without angina pectoris: Secondary | ICD-10-CM | POA: Diagnosis not present

## 2017-06-07 DIAGNOSIS — E785 Hyperlipidemia, unspecified: Secondary | ICD-10-CM | POA: Diagnosis present

## 2017-06-07 DIAGNOSIS — E669 Obesity, unspecified: Secondary | ICD-10-CM | POA: Diagnosis present

## 2017-06-07 DIAGNOSIS — Z823 Family history of stroke: Secondary | ICD-10-CM

## 2017-06-07 DIAGNOSIS — N990 Postprocedural (acute) (chronic) kidney failure: Secondary | ICD-10-CM | POA: Diagnosis present

## 2017-06-07 DIAGNOSIS — Z79899 Other long term (current) drug therapy: Secondary | ICD-10-CM

## 2017-06-07 DIAGNOSIS — Z7902 Long term (current) use of antithrombotics/antiplatelets: Secondary | ICD-10-CM

## 2017-06-07 DIAGNOSIS — K3184 Gastroparesis: Secondary | ICD-10-CM | POA: Diagnosis present

## 2017-06-07 DIAGNOSIS — Z9049 Acquired absence of other specified parts of digestive tract: Secondary | ICD-10-CM

## 2017-06-07 DIAGNOSIS — Z9289 Personal history of other medical treatment: Secondary | ICD-10-CM

## 2017-06-07 DIAGNOSIS — Z794 Long term (current) use of insulin: Secondary | ICD-10-CM

## 2017-06-07 DIAGNOSIS — Z515 Encounter for palliative care: Secondary | ICD-10-CM | POA: Diagnosis not present

## 2017-06-07 DIAGNOSIS — Z8249 Family history of ischemic heart disease and other diseases of the circulatory system: Secondary | ICD-10-CM

## 2017-06-07 DIAGNOSIS — R011 Cardiac murmur, unspecified: Secondary | ICD-10-CM | POA: Diagnosis present

## 2017-06-07 DIAGNOSIS — M270 Developmental disorders of jaws: Secondary | ICD-10-CM | POA: Diagnosis present

## 2017-06-07 DIAGNOSIS — I6523 Occlusion and stenosis of bilateral carotid arteries: Secondary | ICD-10-CM | POA: Diagnosis present

## 2017-06-07 DIAGNOSIS — I509 Heart failure, unspecified: Secondary | ICD-10-CM

## 2017-06-07 DIAGNOSIS — N183 Chronic kidney disease, stage 3 (moderate): Secondary | ICD-10-CM | POA: Diagnosis present

## 2017-06-07 DIAGNOSIS — Z95811 Presence of heart assist device: Secondary | ICD-10-CM | POA: Diagnosis not present

## 2017-06-07 DIAGNOSIS — K083 Retained dental root: Secondary | ICD-10-CM | POA: Diagnosis present

## 2017-06-07 DIAGNOSIS — Z006 Encounter for examination for normal comparison and control in clinical research program: Secondary | ICD-10-CM

## 2017-06-07 DIAGNOSIS — N178 Other acute kidney failure: Secondary | ICD-10-CM | POA: Diagnosis not present

## 2017-06-07 DIAGNOSIS — Z9861 Coronary angioplasty status: Secondary | ICD-10-CM

## 2017-06-07 DIAGNOSIS — I132 Hypertensive heart and chronic kidney disease with heart failure and with stage 5 chronic kidney disease, or end stage renal disease: Secondary | ICD-10-CM | POA: Diagnosis not present

## 2017-06-07 DIAGNOSIS — R34 Anuria and oliguria: Secondary | ICD-10-CM | POA: Diagnosis not present

## 2017-06-07 DIAGNOSIS — Z954 Presence of other heart-valve replacement: Secondary | ICD-10-CM | POA: Diagnosis not present

## 2017-06-07 DIAGNOSIS — Z8371 Family history of colonic polyps: Secondary | ICD-10-CM

## 2017-06-07 DIAGNOSIS — I493 Ventricular premature depolarization: Secondary | ICD-10-CM | POA: Diagnosis present

## 2017-06-07 DIAGNOSIS — Z9889 Other specified postprocedural states: Secondary | ICD-10-CM

## 2017-06-07 HISTORY — PX: RIGHT HEART CATH: CATH118263

## 2017-06-07 LAB — BASIC METABOLIC PANEL
ANION GAP: 10 (ref 5–15)
ANION GAP: 9 (ref 5–15)
BUN: 57 mg/dL — ABNORMAL HIGH (ref 6–20)
BUN: 54 mg/dL — AB (ref 6–20)
CALCIUM: 8.8 mg/dL — AB (ref 8.9–10.3)
CALCIUM: 8.8 mg/dL — AB (ref 8.9–10.3)
CO2: 29 mmol/L (ref 22–32)
CO2: 29 mmol/L (ref 22–32)
CREATININE: 2.09 mg/dL — AB (ref 0.61–1.24)
Chloride: 96 mmol/L — ABNORMAL LOW (ref 101–111)
Chloride: 100 mmol/L — ABNORMAL LOW (ref 101–111)
Creatinine, Ser: 2.2 mg/dL — ABNORMAL HIGH (ref 0.61–1.24)
GFR calc Af Amer: 40 mL/min — ABNORMAL LOW (ref >60)
GFR, EST AFRICAN AMERICAN: 38 mL/min — AB (ref >60)
GFR, EST NON AFRICAN AMERICAN: 33 mL/min — AB (ref >60)
GFR, EST NON AFRICAN AMERICAN: 35 mL/min — AB (ref >60)
GLUCOSE: 229 mg/dL — AB (ref 65–99)
Glucose, Bld: 259 mg/dL — ABNORMAL HIGH (ref 65–99)
POTASSIUM: 4.7 mmol/L (ref 3.5–5.1)
Potassium: 3.8 mmol/L (ref 3.5–5.1)
Sodium: 135 mmol/L (ref 135–145)
Sodium: 138 mmol/L (ref 135–145)

## 2017-06-07 LAB — POCT I-STAT 3, VENOUS BLOOD GAS (G3P V)
ACID-BASE EXCESS: 4 mmol/L — AB (ref 0.0–2.0)
Acid-Base Excess: 3 mmol/L — ABNORMAL HIGH (ref 0.0–2.0)
Bicarbonate: 31 mmol/L — ABNORMAL HIGH (ref 20.0–28.0)
Bicarbonate: 29.6 mmol/L — ABNORMAL HIGH (ref 20.0–28.0)
O2 Saturation: 63 %
O2 Saturation: 63 %
PCO2 VEN: 52.8 mmHg (ref 44.0–60.0)
PH VEN: 7.357 (ref 7.250–7.430)
PH VEN: 7.361 (ref 7.250–7.430)
TCO2: 33 mmol/L — ABNORMAL HIGH (ref 22–32)
TCO2: 31 mmol/L (ref 22–32)
pCO2, Ven: 54.8 mmHg (ref 44.0–60.0)
pO2, Ven: 35 mmHg (ref 32.0–45.0)
pO2, Ven: 35 mmHg (ref 32.0–45.0)

## 2017-06-07 LAB — BRAIN NATRIURETIC PEPTIDE: B NATRIURETIC PEPTIDE 5: 324.2 pg/mL — AB (ref 0.0–100.0)

## 2017-06-07 LAB — GLUCOSE, CAPILLARY
GLUCOSE-CAPILLARY: 286 mg/dL — AB (ref 65–99)
GLUCOSE-CAPILLARY: 183 mg/dL — AB (ref 65–99)
GLUCOSE-CAPILLARY: 177 mg/dL — AB (ref 65–99)

## 2017-06-07 LAB — PROTIME-INR
INR: 0.98
Prothrombin Time: 12.9 seconds (ref 11.4–15.2)

## 2017-06-07 LAB — HEMOGLOBIN A1C
Hgb A1c MFr Bld: 10.1 % — ABNORMAL HIGH (ref 4.8–5.6)
Mean Plasma Glucose: 243.17 mg/dL

## 2017-06-07 LAB — SURGICAL PCR SCREEN
MRSA, PCR: NEGATIVE
STAPHYLOCOCCUS AUREUS: NEGATIVE

## 2017-06-07 LAB — HEPARIN LEVEL (UNFRACTIONATED): Heparin Unfractionated: 0.36 IU/mL (ref 0.30–0.70)

## 2017-06-07 SURGERY — RIGHT HEART CATH
Anesthesia: LOCAL

## 2017-06-07 MED ORDER — PANTOPRAZOLE SODIUM 40 MG PO TBEC
40.0000 mg | DELAYED_RELEASE_TABLET | Freq: Every day | ORAL | Status: DC
  Administered 2017-06-07 – 2017-06-11 (×4): 40 mg via ORAL
  Filled 2017-06-07 (×4): qty 1

## 2017-06-07 MED ORDER — SODIUM CHLORIDE 0.9% FLUSH
10.0000 mL | INTRAVENOUS | Status: DC | PRN
  Administered 2017-06-09: 10 mL
  Filled 2017-06-07: qty 40

## 2017-06-07 MED ORDER — ACETAMINOPHEN 325 MG PO TABS
650.0000 mg | ORAL_TABLET | ORAL | Status: DC | PRN
  Administered 2017-06-11 (×2): 650 mg via ORAL
  Filled 2017-06-07 (×2): qty 2

## 2017-06-07 MED ORDER — CEFAZOLIN SODIUM-DEXTROSE 2-4 GM/100ML-% IV SOLN: 2.0000 g | Freq: Once | INTRAVENOUS | Status: DC

## 2017-06-07 MED ORDER — SPIRONOLACTONE 25 MG PO TABS
12.5000 mg | ORAL_TABLET | Freq: Every day | ORAL | Status: DC
  Administered 2017-06-07 – 2017-06-11 (×4): 12.5 mg via ORAL
  Filled 2017-06-07 (×4): qty 1

## 2017-06-07 MED ORDER — ONDANSETRON HCL 4 MG/2ML IJ SOLN
4.0000 mg | Freq: Four times a day (QID) | INTRAMUSCULAR | Status: DC | PRN
Start: 2017-06-07 — End: 2017-06-12
  Administered 2017-06-08 – 2017-06-10 (×3): 4 mg via INTRAVENOUS
  Filled 2017-06-07 (×2): qty 2

## 2017-06-07 MED ORDER — SODIUM CHLORIDE 0.9% FLUSH: 3.0000 mL | INTRAVENOUS | Status: DC | PRN

## 2017-06-07 MED ORDER — MIDAZOLAM HCL 2 MG/2ML IJ SOLN
INTRAMUSCULAR | Status: AC
  Filled 2017-06-07: qty 2

## 2017-06-07 MED ORDER — METOCLOPRAMIDE HCL 10 MG PO TABS
5.0000 mg | ORAL_TABLET | Freq: Three times a day (TID) | ORAL | Status: DC
  Administered 2017-06-07 – 2017-06-11 (×8): 5 mg via ORAL
  Filled 2017-06-07 (×12): qty 1

## 2017-06-07 MED ORDER — FENTANYL CITRATE (PF) 100 MCG/2ML IJ SOLN
INTRAMUSCULAR | Status: DC | PRN
  Administered 2017-06-07: 25 ug via INTRAVENOUS

## 2017-06-07 MED ORDER — ONDANSETRON 4 MG PO TBDP: 4.0000 mg | ORAL_TABLET | Freq: Four times a day (QID) | ORAL | Status: DC | PRN

## 2017-06-07 MED ORDER — INSULIN ASPART 100 UNIT/ML ~~LOC~~ SOLN
7.0000 [IU] | Freq: Every day | SUBCUTANEOUS | Status: DC
  Administered 2017-06-07 – 2017-06-11 (×3): 7 [IU] via SUBCUTANEOUS

## 2017-06-07 MED ORDER — INSULIN GLARGINE 100 UNIT/ML ~~LOC~~ SOLN
21.0000 [IU] | Freq: Every day | SUBCUTANEOUS | Status: DC
  Administered 2017-06-07 – 2017-06-11 (×4): 21 [IU] via SUBCUTANEOUS
  Filled 2017-06-07 (×6): qty 0.21

## 2017-06-07 MED ORDER — HEPARIN (PORCINE) IN NACL 2-0.9 UNIT/ML-% IJ SOLN
INTRAMUSCULAR | Status: AC
  Filled 2017-06-07: qty 500

## 2017-06-07 MED ORDER — FENTANYL CITRATE (PF) 100 MCG/2ML IJ SOLN
INTRAMUSCULAR | Status: AC
  Filled 2017-06-07: qty 2

## 2017-06-07 MED ORDER — HEPARIN SODIUM (PORCINE) 1000 UNIT/ML IJ SOLN
INTRAMUSCULAR | Status: AC
  Filled 2017-06-07: qty 1

## 2017-06-07 MED ORDER — SODIUM CHLORIDE 0.9% FLUSH
3.0000 mL | Freq: Two times a day (BID) | INTRAVENOUS | Status: DC
  Administered 2017-06-08 – 2017-06-11 (×4): 3 mL via INTRAVENOUS

## 2017-06-07 MED ORDER — HEPARIN (PORCINE) IN NACL 100-0.45 UNIT/ML-% IJ SOLN
1200.0000 [IU]/h | INTRAMUSCULAR | Status: DC
  Administered 2017-06-07: 1200 [IU]/h via INTRAVENOUS
  Filled 2017-06-07 (×2): qty 250

## 2017-06-07 MED ORDER — INSULIN ASPART 100 UNIT/ML ~~LOC~~ SOLN: 0.0000 [IU] | Freq: Every day | SUBCUTANEOUS | Status: DC

## 2017-06-07 MED ORDER — MIDAZOLAM HCL 2 MG/2ML IJ SOLN
INTRAMUSCULAR | Status: DC | PRN
  Administered 2017-06-07: 1 mg via INTRAVENOUS

## 2017-06-07 MED ORDER — MAGNESIUM OXIDE 400 (241.3 MG) MG PO TABS
400.0000 mg | ORAL_TABLET | Freq: Every day | ORAL | Status: DC
  Administered 2017-06-07 – 2017-06-11 (×4): 400 mg via ORAL
  Filled 2017-06-07 (×4): qty 1

## 2017-06-07 MED ORDER — CARVEDILOL 3.125 MG PO TABS
3.1250 mg | ORAL_TABLET | Freq: Two times a day (BID) | ORAL | Status: DC
  Administered 2017-06-07 – 2017-06-11 (×9): 3.125 mg via ORAL
  Filled 2017-06-07 (×10): qty 1

## 2017-06-07 MED ORDER — INSULIN ASPART 100 UNIT/ML ~~LOC~~ SOLN
0.0000 [IU] | Freq: Three times a day (TID) | SUBCUTANEOUS | Status: DC
Start: 2017-06-07 — End: 2017-06-12
  Administered 2017-06-07 – 2017-06-10 (×4): 3 [IU] via SUBCUTANEOUS
  Administered 2017-06-10: 2 [IU] via SUBCUTANEOUS
  Administered 2017-06-11: 3 [IU] via SUBCUTANEOUS
  Administered 2017-06-11 (×2): 2 [IU] via SUBCUTANEOUS

## 2017-06-07 MED ORDER — CEFAZOLIN SODIUM-DEXTROSE 2-4 GM/100ML-% IV SOLN
2.0000 g | INTRAVENOUS | Status: AC
  Administered 2017-06-08: 2 g via INTRAVENOUS
  Filled 2017-06-07: qty 100

## 2017-06-07 MED ORDER — DIGOXIN 125 MCG PO TABS
0.1250 mg | ORAL_TABLET | Freq: Every day | ORAL | Status: DC
  Administered 2017-06-07 – 2017-06-13 (×5): 0.125 mg via ORAL
  Filled 2017-06-07 (×5): qty 1

## 2017-06-07 MED ORDER — ATORVASTATIN CALCIUM 80 MG PO TABS
80.0000 mg | ORAL_TABLET | Freq: Every day | ORAL | Status: DC
  Administered 2017-06-07 – 2017-06-11 (×5): 80 mg via ORAL
  Filled 2017-06-07 (×6): qty 1

## 2017-06-07 MED ORDER — LIDOCAINE HCL (PF) 1 % IJ SOLN
INTRAMUSCULAR | Status: DC | PRN
  Administered 2017-06-07: 4 mL

## 2017-06-07 MED ORDER — SODIUM CHLORIDE 0.9 % IV SOLN
INTRAVENOUS | Status: DC
Start: 2017-06-07 — End: 2017-06-12
  Administered 2017-06-11: 19:00:00 via INTRAVENOUS

## 2017-06-07 MED ORDER — ISOSORB DINITRATE-HYDRALAZINE 20-37.5 MG PO TABS
1.0000 | ORAL_TABLET | Freq: Three times a day (TID) | ORAL | Status: DC
  Administered 2017-06-07 – 2017-06-11 (×12): 1 via ORAL
  Filled 2017-06-07 (×17): qty 1

## 2017-06-07 MED ORDER — SODIUM CHLORIDE 0.9 % IV SOLN: 250.0000 mL | INTRAVENOUS | Status: DC | PRN

## 2017-06-07 MED ORDER — HEPARIN (PORCINE) IN NACL 2-0.9 UNIT/ML-% IJ SOLN
INTRAMUSCULAR | Status: AC | PRN
  Administered 2017-06-07: 500 mL

## 2017-06-07 MED ORDER — GABAPENTIN 300 MG PO CAPS
300.0000 mg | ORAL_CAPSULE | Freq: Two times a day (BID) | ORAL | Status: DC
  Administered 2017-06-07 – 2017-06-11 (×9): 300 mg via ORAL
  Filled 2017-06-07 (×10): qty 1

## 2017-06-07 MED ORDER — SODIUM CHLORIDE 0.9 % IV SOLN
250.0000 mL | INTRAVENOUS | Status: DC | PRN
  Administered 2017-06-08: 07:00:00 via INTRAVENOUS

## 2017-06-07 MED ORDER — MILRINONE LACTATE IN DEXTROSE 20-5 MG/100ML-% IV SOLN
0.1250 ug/kg/min | INTRAVENOUS | Status: DC
  Administered 2017-06-07 – 2017-06-12 (×5): 0.125 ug/kg/min via INTRAVENOUS
  Filled 2017-06-07 (×6): qty 100

## 2017-06-07 MED ORDER — ASPIRIN 81 MG PO CHEW: 81.0000 mg | CHEWABLE_TABLET | ORAL | Status: AC

## 2017-06-07 SURGICAL SUPPLY — 11 items
CATH SWAN GANZ 7F STRAIGHT (CATHETERS) IMPLANT
CATH SWAN GANZ VIP 7.5F (CATHETERS) ×1 IMPLANT
COVER PRB 48X5XTLSCP FOLD TPE (BAG) IMPLANT
COVER PROBE 5X48 (BAG) ×2
PACK CARDIAC CATHETERIZATION (CUSTOM PROCEDURE TRAY) ×2 IMPLANT
PROTECTION STATION PRESSURIZED (MISCELLANEOUS) ×2
SHEATH PINNACLE 7F 10CM (SHEATH) IMPLANT
SHEATH PINNACLE 8F 10CM (SHEATH) ×1 IMPLANT
SLEEVE REPOSITIONING LENGTH 30 (MISCELLANEOUS) ×1 IMPLANT
STATION PROTECTION PRESSURIZED (MISCELLANEOUS) IMPLANT
TRANSDUCER W/STOPCOCK (MISCELLANEOUS) ×2 IMPLANT

## 2017-06-07 NOTE — Anesthesia Preprocedure Evaluation (Addendum)
Anesthesia Evaluation  Patient identified by MRN, date of birth, ID band Patient awake    Reviewed: Allergy & Precautions, NPO status , Patient's Chart, lab work & pertinent test results, reviewed documented beta blocker date and time   Airway Mallampati: II  TM Distance: >3 FB Neck ROM: Limited    Dental  (+) Dental Advisory Given, Poor Dentition, Loose   Pulmonary neg pulmonary ROS,    breath sounds clear to auscultation       Cardiovascular hypertension, + CAD, + Past MI, + Cardiac Stents, +CHF and + DVT  + Cardiac Defibrillator  Rhythm:Regular Rate:Normal  Exercise stress test 04/2017 - Severely reduced functional capacity due to heart failure. Pre-exercise spirometry demonstrates mild restrictive lung physiology. Elevated VE/VCO2 slope, oscillatory ventilation, and blunted BP response all individually predict poor short-term prognosis with heart failure.   TTE 04/2017 - Mild concentric LVH. Ejection fraction was in the range of 20% to 25%. Diffuse hypokinesis. Grade 3 diastolic dysfunction. Trivial AI, mild MR. Severely dilated left atrium, mildly dilated right atrium.  Cath 11/2016 - LESION #1: Dist LAD-1 lesion, 95 %stenosed. A STENT SYNERGY DES 2.75X28 drug eluting stent was successfully placed, and overlaps previously placed stent. Post intervention, there is a 0% residual stenosis. LESION #2: Prox RCA lesion, 95 %stenosed. A STENT SYNERGY DES 3.0X32 drug eluting stent was successfully placed. Post intervention, there is a 0% residual stenosis. -------------- Additional Findings ------------------- Colon Flattery 1st Diag to 1st Diag lesion, 80 %stenosed. 1st Diag CYPHER DES 2.5 X 13, 0 %stenosed. Ost 2nd Diag to 2nd Diag CYPHER DES 2.5 X 13 & 2.5 X 8, 0 %stenosed. Mid LAD CYPHER DES 3.0 X 13, 0 %stenosed. Dist-apical LAD-2 lesion, 55 %stenosed. Ost Ramus lesion, 80 %stenosed. Prox Cx lesion, 70 %stenosed. 3rd Mrg lesion, 50  %stenosed. Mid RCA lesion, 60 %stenosed. Dist RCA lesion, 60 %stenosed. - Borderline significance Ost RPDA lesion, 60 %stenosed. 3rd RPLB lesion, 60 %stenosed. Too distal for PCI  EKG - occasional PVCs, septal Q waves   Neuro/Psych Hx narcolepsy / hypersomnolence negative psych ROS   GI/Hepatic Neg liver ROS, GERD  Medicated and Controlled,Crohn's disease   Endo/Other  negative endocrine ROSdiabetes, Insulin Dependent  Renal/GU Renal InsufficiencyRenal disease  negative genitourinary   Musculoskeletal negative musculoskeletal ROS (+)   Abdominal   Peds  Hematology  (+) anemia ,   Anesthesia Other Findings   Reproductive/Obstetrics                            Anesthesia Physical Anesthesia Plan  ASA: IV  Anesthesia Plan: General   Post-op Pain Management:    Induction: Intravenous  PONV Risk Score and Plan: 3 and Ondansetron, Dexamethasone, Midazolam and Treatment may vary due to age or medical condition  Airway Management Planned: Nasal ETT  Additional Equipment: Arterial line  Intra-op Plan:   Post-operative Plan: Extubation in OR  Informed Consent: I have reviewed the patients History and Physical, chart, labs and discussed the procedure including the risks, benefits and alternatives for the proposed anesthesia with the patient or authorized representative who has indicated his/her understanding and acceptance.   Dental advisory given  Plan Discussed with: CRNA  Anesthesia Plan Comments:         Anesthesia Quick Evaluation

## 2017-06-07 NOTE — Telephone Encounter (Signed)
CSW received call from patient's daughter stating patient is in need of Advanced Directive. Patient apparently completed and spilled on form and daughter requesting new form. Patient is in the Cath lab and she will notify CSW when they are in a hospital room. CSW will refer to [facility] for completion. CSW will continue to follow for support and pending LVAD implantation next week. Raquel Sarna, [place], [place]

## 2017-06-07 NOTE — H&P (View-Only) (Signed)
PCP: Dr. Ola Spurr HF Cardiology: Dr. Aundra Dubin  51 yo with history of CAD s/p anterior MI in 2010 and chronic systolic CHF (ischemic cardiomyopathy) was admitted in 3/18 with 10 days of increased dyspnea and nausea.  Patient had been stable until that time, taking Lasix 80 mg po bid. Noted that weight was rising and he was developing lower extremity edema that was spreading up to his abdomen.  He tried increasing Lasix to 80 mg tid but this did not help.  He had orthopnea.  No chest pain.  Symptoms and presentation consistent with CHF, but troponin found to be elevated at 5.11.  UOP initially poor with rise in creatinine to 2.6. He was found to have low output with co-ox 36% and was started on milrinone gtt with diuresis.  Volume status and creatinine gradually improved.  He eventually underwent cath with severe proximal RCA and mid LAD stenoses noted, these were treated with DES.   He was readmitted shortly after discharge with chest pain.  This was not thought to be ACS but he was found to be in AKI again with hyperkalemia.  Digoxin and spironolactone were stopped.  Torsemide was stopped for a couple of days.   Admitted 02/08/17-02/13/17 with acute on chronic systolic CHF, diuresed 15 pounds with IV lasix. Co ox was stable in the 70's. Also developed fever with leukocytosis, started on Vanc and Zoysn for 4 days then sent home with 4 days of Levaquin. Discharge weight was 187 pounds.   He was admitted again in 8/18 with acute on chronic systolic CHF.  Co-ox was ok, inotrope not needed.  He was diuresed and discharged.   He had repeat echo in 8/18 with EF 20-25%, RV looked ok.  CXP in 8/18 showed severe HF limitation.    He continues to be limited.  He is getting PT at home.  He is short of breath walking < 1 block on flat ground.  He is very short of breath with stairs.  Able to do ADLs still.  Stable, mild atypical chest pain.  He has PAD and has pain in his thighs with ambulation though this is not  particularly limiting.  No pedal sores/ulcers. No palpitations or lightheadedness.  We have begun LVAD workup and he has seen Dr. Prescott Gum.   Labs (6/18): K 4.5, Creatinine 2.28 Labs (4/18): K 5.7 => 4.7, creatinine 1.67 => 1.95, hgb 12.8, BNP 383 Labs (5/18): K 4.8, creatinine 2.05.  Labs (8/18): K 3.7, creatinine 1.71, hgb 10.9, LDL 54, HDL 50, hgbA1c 9.7 Labs (9/18): K 4.7, creatinine 2.14  PMH: 1. Chronic systolic CHF: Ischemic cardiomyopathy.  St Jude ICD.  - Echo (11/17) with EF 25-30%, moderate diastolic dysfunction, mild MR, mildly dilated RV with moderately decreased systolic function, peak RV-RA gradient 48 mmHg.  - Echo (3/18) with EF 25-30%, wall motion abnormalities, normal RV size and systolic function.  - Admission 3/18 with cardiogenic shock and AKI, required milrinone gtt.  - Echo (8/18) with EF 20-25%, mild LVH, moderate LV dilation, mild MR.  - CPX (8/18) with peak VO2 12.4, VE/VCO2 slope 35, RER 1.05 => severe HR limitation.  2. CAD: Anterior MI 2005, had bifurcation stenting LAD/diagonal.  Repeat cath 2010 with patent stents.  - NSTEMI 3/18: LHC with 99% ulcerated lesion proximal RCA, 95% mid LAD => PCI with DES to proximal RCA and mid LAD.  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  - Lower extremity venous dopplers (8/18): chronic DVT - V/Q scan (8/18): Normal, no  PE.  4. Crohns disease: History of colectomy.  5. GERD 6. Gastroparesis 7. HTN 8. Hyperlipidemia 9. Type II diabetes  10. CKD: Stage 3.  11. Carotid dopplers (8/18): mild bilateral stenosis.  12. PFTs (8/18): Mild restriction (no IDL on CT chest).  13. Lung nodules: CT chest 8/18 with small bilateral nodules.  14. PAD: ABIs with moderate bilateral disease in 8/18.   Social History   Social History  . Marital status: Married    Spouse name: N/A  . Number of children: N/A  . Years of education: N/A   Occupational History  . Not on file.   Social History Main Topics  . Smoking status: Never Smoker  .  Smokeless tobacco: Never Used  . Alcohol use No  . Drug use: No  . Sexual activity: No   Other Topics Concern  . Not on file   Social History Narrative  . No narrative on file   Family History  Problem Relation Age of Onset  . Colon polyps Mother   . Diabetes Mother   . Heart attack Father   . Diabetes Father   . Stroke Paternal Grandfather   . Colitis Sister   . Heart disease Brother   . Colitis Sister   . Colon cancer Neg Hx    Review of systems complete and found to be negative unless listed in HPI.    Current Outpatient Prescriptions  Medication Sig Dispense Refill  . atorvastatin (LIPITOR) 80 MG tablet Take 1 tablet (80 mg total) by mouth daily at 6 PM. 30 tablet 6  . carvedilol (COREG) 6.25 MG tablet Take 0.5 tablets (3.125 mg total) by mouth 2 (two) times daily. 60 tablet 0  . clopidogrel (PLAVIX) 75 MG tablet Take 1 tablet (75 mg total) by mouth daily. 30 tablet 6  . digoxin (LANOXIN) 0.125 MG tablet Take 1 tablet (0.125 mg total) by mouth daily. 90 tablet 3  . empagliflozin (JARDIANCE) 25 MG TABS tablet Take 25 mg by mouth daily. 30 tablet 3  . gabapentin (NEURONTIN) 300 MG capsule Take 1 capsule (300 mg total) by mouth 2 (two) times daily. 60 capsule 1  . glucose blood (CONTOUR NEXT TEST) test strip Use twice daily as directed 100 each 12  . insulin aspart (NOVOLOG) 100 UNIT/ML injection Inject 7 Units into the skin daily with supper. 1 vial 0  . insulin aspart (NOVOLOG) 100 UNIT/ML injection Inject 7 Units into the skin daily with supper. 1 vial 3  . insulin glargine (LANTUS) 100 UNIT/ML injection Inject 0.21 mLs (21 Units total) into the skin daily.    . isosorbide-hydrALAZINE (BIDIL) 20-37.5 MG tablet Take 1.5 tablets by mouth 3 (three) times daily. 135 tablet 3  . magnesium oxide (MAG-OX) 400 MG tablet Take 1 tablet (400 mg total) by mouth daily. 30 tablet 3  . metoCLOPramide (REGLAN) 5 MG tablet Take one tablet 1/2 hr before meals. 90 tablet 2  . nitroGLYCERIN  (NITROSTAT) 0.4 MG SL tablet Place 1 tablet (0.4 mg total) under the tongue every 5 (five) minutes as needed for chest pain. 25 tablet 3  . ondansetron (ZOFRAN ODT) 4 MG disintegrating tablet Take 1 tablet (4 mg total) by mouth every 6 (six) hours as needed for nausea or vomiting. 40 tablet 1  . pantoprazole (PROTONIX) 40 MG tablet Take 40 mg by mouth daily.    [name] spironolactone (ALDACTONE) 25 MG tablet Take 0.5 tablets (12.5 mg total) by mouth daily. 30 tablet 0  . torsemide (DEMADEX)  20 MG tablet Take 80 mg (4 Tabs) Daily in the AM and 80 mg (4 Tabs) Every evening. 180 tablet 3  . warfarin (COUMADIN) 5 MG tablet TAKE AS DIRECTED BY COUMADIN CLINIC 50 tablet 1   No current facility-administered medications for this encounter.    BP 126/78   Pulse 82   Wt 185 lb 12.8 oz (84.3 kg)   SpO2 97%   BMI 27.64 kg/m    Wt Readings from Last 3 Encounters:  05/15/17 185 lb 12.8 oz (84.3 kg)  05/02/17 185 lb (83.9 kg)  04/25/17 181 lb 12 oz (82.4 kg)    General: NAD Neck: JVP 7-8 cm, no thyromegaly or thyroid nodule.  Lungs: Clear to auscultation bilaterally with normal respiratory effort. CV: Lateral PMI.  Heart regular S1/S2, no S3/S4, no murmur.  No peripheral edema.  No carotid bruit.  Unable to palpate pedal pulses.  Abdomen: Soft, nontender, no hepatosplenomegaly, no distention.  Skin: Intact without lesions or rashes.  Neurologic: Alert and oriented x 3.  Psych: Normal affect. Extremities: No clubbing or cyanosis.  HEENT: Normal.   Assessment/Plan: 1. Chronic systolic CHF: Ischemic cardiomyopathy.  St Jude ICD.  NSTEMI in March 2018 with DES to LAD and RCA, complicated by cardiogenic shock, low output requiring milrinone.  Most recent echo 8/18 with EF 20-25%, moderate MR.  NYHA class IIIb-IV symptoms (fluctuate).  CPX (8/18) with severe functional impairment due to HF.  On exam, he is not significantly volume overloaded.  - Continue torsemide 80 mg bid.  BMET today.   - Continue Bidil  1.5 tabs tid today.   - Continue current Coreg - Continue digoxin, check level today.  - Continue spironolactone, BMET today.   - Not on ACEI/ARB/ARNI with CKD.  - Narrow QRS, not CRT candidate.   - I think he is nearing the need for LVAD placement.  CPX showed a severe functional impairment from HF and creatinine is worsening, likely cardiorenal.  NYHA class IIIb-IV, fluctuates.  He has seen Dr. Prescott Gum.  He still needs dental extractions.  I will plan on admitting him for RHC => he will need to come off coumadin and bridge with Lovenox. I will assess cardiac output, but most likely will try to optimize creatinine prior to LVAD by using milrinone.  After RHC, he will need dental extractions. After this, and Plavix washout, he would potentially be ready for LVAD placement.  I discussed risks/benefits of RHC with him and he agrees to proceed.  2. CKD stage III: Suspect a cardiorenal component given worsening over the last few months.  BMET today.  He may need optimization with milrinone prior to LVAD.   3. CAD: NSTEMI in 3/18. LHC with 99% ulcerated lesion proximal RCA with left to right collaterals, 95% mid LAD stenosis after mid LAD stent.  s/p PCI to RCA and LAD on 11/25/16.  No exertional chest pain.  - He should be able to stop Plavix in the setting of warfarin use relatively safely 6 months post-PCI, this would be around 05/28/17.  Need to stop Plavix for LVAD.  - Continue Coreg as above.  - Continue high intensity statin, good lipids recently.  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 Recent V/Q scan did not show acute or chronic PE.  5. Chronic N/V/D with gastroparesis: Follows with GI. Quiescent currently.   6. DM: On Jardiance, hemoglobin A1c has improved.   Followup in 2-3 weeks.  Loralie Champagne, MD 05/16/2017

## 2017-06-07 NOTE — H&P (Addendum)
Advanced Heart Failure Team Admission Note   Primary Cardiologist:  Aundra Dubin  HPI:    [name] is admitted today for hemodynamic optimization prior to LVAD placement.    51 yo with history of CAD s/p anterior MI in 2010 and chronic systolic CHF (ischemic cardiomyopathy) was admitted in 3/18 with 10 days of increased dyspnea and nausea. Patient had been stable until that time, taking Lasix 80 mg po bid. Noted that weight was rising and he was developing lower extremity edema that was spreading up to his abdomen. He tried increasing Lasix to 80 mg tid but this did not help. He had orthopnea. No chest pain. Symptoms and presentation consistent with CHF, but troponin found to be elevated at 5.11. UOP initially poor with rise in creatinine to 2.6. He was found to have low output with co-ox 36% and was started on milrinone gtt with diuresis.  Volume status and creatinine gradually improved.  He eventually underwent cath with severe proximal RCA and mid LAD stenoses noted, these were treated with DES.   He was readmitted shortly after discharge with chest pain.  This was not thought to be ACS but he was found to be in AKI again with hyperkalemia.  Digoxin and spironolactone were stopped.  Torsemide was stopped for a couple of days.   Admitted 02/08/17-02/13/17 with acute on chronic systolic CHF, diuresed 15 pounds with IV lasix. Co ox was stable in the 70's. Also developed fever with leukocytosis, started on Vanc and Zoysn for 4 days then sent home with 4 days of Levaquin. Discharge weight was 187 pounds.   He was admitted again in 8/18 with acute on chronic systolic CHF.  Co-ox was ok, inotrope not needed.  He was diuresed and discharged.   He had repeat echo in 8/18 with EF 20-25%, RV looked ok.  CPX in 8/18 showed severe HF limitation.    He continues to be limited.  He is short of breath walking < 1 block on flat ground.  He is very short of breath with stairs.  Able to do ADLs  still.  Stable, mild atypical chest pain.  He has PAD and has pain in his thighs with ambulation though this is not particularly limiting.  No pedal sores/ulcers. No palpitations or lightheadedness.  We have begun LVAD workup and he has seen Dr. Prescott [name].  He has been presented in [facility] and thought to be a reasonable LVAD candidate.  I have been concerned that we not miss our window with him given gradual worsening of renal function. RHC today showed marginal cardiac index at 2.0, creatinine was 2.09.  Filling pressures optimized.  [name] was left in place. He will need teeth removed tomorrow, tentative plan for LVAD next Monday.   ROS: All systems reviewed and negative except as per HPI.   Home Medications Prior to Admission medications   Medication Sig Start Date End Date Taking? Authorizing Provider  atorvastatin (LIPITOR) 80 MG tablet Take 1 tablet (80 mg total) by mouth daily at 6 PM. 11/28/16  Yes Tillery, Satira Mccallum, PA-C  carvedilol (COREG) 6.25 MG tablet Take 0.5 tablets (3.125 mg total) by mouth 2 (two) times daily. 02/13/17  Yes Clegg, Amy D, NP  clopidogrel (PLAVIX) 75 MG tablet Take 1 tablet (75 mg total) by mouth daily. 11/29/16  Yes Shirley Friar, PA-C  digoxin (LANOXIN) 0.125 MG tablet Take 1 tablet (0.125 mg total) by mouth daily. 12/13/16  Yes Larey Dresser, MD  empagliflozin (JARDIANCE) 25 MG TABS tablet Take 25 mg by mouth daily. 05/11/17  Yes Hensel, Jamal Collin, MD  enoxaparin (LOVENOX) 100 MG/ML injection Inject 0.9 mLs (90 mg total) into the skin every 12 (twelve) hours. 05/24/17  Yes Larey Dresser, MD  gabapentin (NEURONTIN) 300 MG capsule Take 1 capsule (300 mg total) by mouth 2 (two) times daily. 05/10/17  Yes [name], MD  insulin aspart (NOVOLOG) 100 UNIT/ML injection Inject 7 Units into the skin daily with supper. 05/11/17  Yes Hensel, Jamal Collin, MD  insulin glargine (LANTUS) 100 UNIT/ML injection Inject 0.21 mLs (21 Units total) into the skin  daily. Patient taking differently: Inject 21 Units into the skin every evening.  05/11/17  Yes Hensel, Jamal Collin, MD  isosorbide-hydrALAZINE (BIDIL) 20-37.5 MG tablet Take 1.5 tablets by mouth 3 (three) times daily. 04/25/17  Yes Larey Dresser, MD  magnesium oxide (MAG-OX) 400 MG tablet Take 1 tablet (400 mg total) by mouth daily. 05/05/17  Yes Larey Dresser, MD  metoCLOPramide (REGLAN) 5 MG tablet Take one tablet 1/2 hr before meals. Patient taking differently: Take by mouth 3 (three) times daily before meals.  01/10/17  Yes Esterwood, Amy S, PA-C  pantoprazole (PROTONIX) 40 MG tablet Take 40 mg by mouth daily.   Yes [provider]  spironolactone (ALDACTONE) 25 MG tablet Take 0.5 tablets (12.5 mg total) by mouth daily. 04/14/17  Yes Carlyle Dolly, MD  torsemide (DEMADEX) 20 MG tablet Take 80 mg (4 Tabs) Daily in the AM and 80 mg (4 Tabs) Every evening. 05/11/17  Yes Hensel, Jamal Collin, MD  warfarin (COUMADIN) 5 MG tablet TAKE AS DIRECTED BY COUMADIN CLINIC Patient taking differently: Take 5-7.5 mg by mouth See admin instructions. 82m on Sun/Mon/Wed/Fri and 7.544mon Tues/Thurs/Sat. TAKE AS DIRECTED BY COUMADIN CLINIC 04/20/17  Yes KlDeboraha SprangMD  nitroGLYCERIN (NITROSTAT) 0.4 MG SL tablet Place 1 tablet (0.4 mg total) under the tongue every 5 (five) minutes as needed for chest pain. 07/13/16   SkJerline PainMD  ondansetron (ZOFRAN ODT) 4 MG disintegrating tablet Take 1 tablet (4 mg total) by mouth every 6 (six) hours as needed for nausea or vomiting. 01/10/17   Esterwood, Amy S,Chauncey CruelPA-C   PMH: 1. Chronic systolic CHF: Ischemic cardiomyopathy. St Jude ICD.  - Echo (11/17) with EF 25-30%, moderate diastolic dysfunction, mild MR, mildly dilated RV with moderately decreased systolic function, peak RV-RA gradient 48 mmHg.  - Echo (3/18) with EF 25-30%, wall motion abnormalities, normal RV size and systolic function.  - Admission 3/18 with cardiogenic shock and AKI, required milrinone  gtt.  - Echo (8/18) with EF 20-25%, mild LVH, moderate LV dilation, mild MR.  - CPX (8/18) with peak VO2 12.4, VE/VCO2 slope 35, RER 1.05 => severe HF limitation.  2. CAD: Anterior MI 2005, had bifurcation stenting LAD/diagonal. Repeat cath 2010 with patent stents.  - NSTEMI 3/18: LHC with 99% ulcerated lesion proximal RCA, 95% mid LAD => PCI with DES to proximal RCA and mid LAD.  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  - Lower extremity venous dopplers (8/18): chronic DVT - V/Q scan (8/18): Normal, no PE.  - Factor V Leiden heterozygote.  4. Crohns disease: History of colectomy.  5. GERD 6. Gastroparesis 7. HTN 8. Hyperlipidemia 9. Type II diabetes  10. CKD: Stage 3.  11. Carotid dopplers (8/18): mild bilateral stenosis.  12. PFTs (8/18): Mild restriction (no IDL on CT chest).  13. Lung nodules: CT chest  8/18 with small bilateral nodules.  14. PAD: ABIs with moderate bilateral disease in 8/18.   Past Surgical History: Past Surgical History:  Procedure Laterality Date  . APPENDECTOMY    . CARDIAC DEFIBRILLATOR PLACEMENT  2010   St. Jude ICD  . COLECTOMY     "for Crohn's"  . COLON SURGERY    . CORONARY STENT INTERVENTION N/A 11/25/2016   Procedure: Coronary Stent Intervention;  Surgeon: Leonie Man, MD;  Location: [facility];  Service: Cardiovascular;  Laterality: N/A;  . EP IMPLANTABLE DEVICE N/A 09/14/2016   Procedure: ICD Generator Changeout;  Surgeon: Deboraha Sprang, MD;  Location: [facility];  Service: Cardiovascular;  Laterality: N/A;  . FETAL SURGERY FOR CONGENITAL HERNIA    . INGUINAL HERNIA REPAIR    . RIGHT/LEFT HEART CATH AND CORONARY ANGIOGRAPHY N/A 11/23/2016   Procedure: Right/Left Heart Cath and Coronary Angiography;  Surgeon: Larey Dresser, MD;  Location: [facility];  Service: Cardiovascular;  Laterality: N/A;    Family History: Family History  Problem Relation Age of Onset  . Colon polyps Mother   . Diabetes Mother   . Heart attack  Father   . Diabetes Father   . Stroke Paternal Grandfather   . Colitis Sister   . Heart disease Brother   . Colitis Sister   . Colon cancer Neg Hx     Social History: Social History   Social History  . Marital status: Married    Spouse name: N/A  . Number of children: N/A  . Years of education: N/A   Social History Main Topics  . Smoking status: Never Smoker  . Smokeless tobacco: Never Used  . Alcohol use No  . Drug use: No  . Sexual activity: No   Other Topics Concern  . Not on file   Social History Narrative  . No narrative on file    Allergies:  Allergies  Allergen Reactions  . Metformin And Related Diarrhea    Objective:    Vital Signs:   Temp:  [98.5 F (36.9 C)] 98.5 F (36.9 C) (10/03 0631) Pulse Rate:  [67-88] 67 (10/03 1054) Resp:  [11-31] 19 (10/03 1054) BP: (125-160)/(78-95) 138/85 (10/03 1054) SpO2:  [92 %-97 %] 96 % (10/03 1054) Weight:  [189 lb (85.7 kg)] 189 lb (85.7 kg) (10/03 0631)    Weight change: Filed Weights   06/07/17 0631  Weight: 189 lb (85.7 kg)    Intake/Output:  No intake or output data in the 24 hours ending 06/07/17 1101    Physical Exam    General:  Well appearing. No resp difficulty HEENT: normal Neck: supple. JVP not elevated. Carotids 2+ bilat; no bruits. No lymphadenopathy or thyromegaly appreciated. Cor: PMI lateral. Regular rate & rhythm. No rubs, gallops or murmurs. Lungs: clear Abdomen: soft, nontender, nondistended. No hepatosplenomegaly. No bruits or masses. Good bowel sounds. Extremities: no cyanosis, clubbing, rash, edema Neuro: alert & orientedx3, cranial nerves grossly intact. moves all 4 extremities w/o difficulty. Affect pleasant   Telemetry   NSR (personally reviewed).    Labs   Basic Metabolic Panel:  Recent Labs Lab 06/07/17 0652 06/07/17 0736  NA 135 138  K 4.7 3.8  CL 96* 100*  CO2 29 29  GLUCOSE 259* 229*  BUN 57* 54*  CREATININE 2.20* 2.09*  CALCIUM 8.8* 8.8*    Liver  Function Tests: No results for input(s): AST, ALT, ALKPHOS, BILITOT, PROT, ALBUMIN in the last 168 hours. No results  for input(s): LIPASE, AMYLASE in the last 168 hours. No results for input(s): AMMONIA in the last 168 hours.  CBC: No results for input(s): WBC, NEUTROABS, HGB, HCT, MCV, PLT in the last 168 hours.  Cardiac Enzymes: No results for input(s): CKTOTAL, CKMB, CKMBINDEX, TROPONINI in the last 168 hours.  BNP: BNP (last 3 results)  Recent Labs  01/17/17 1204 02/08/17 1250 04/05/17 1510  BNP 353.6* 1,330.6* 2,214.8*    ProBNP (last 3 results) No results for input(s): PROBNP in the last 8760 hours.   CBG:  Recent Labs Lab 05/31/17 1505 05/31/17 1548 05/31/17 1614 05/31/17 1636 06/07/17 0636  GLUCAP 151* 51* 65 125* 286*    Coagulation Studies:  Recent Labs  06/07/17 0736  LABPROT 12.9  INR 0.98     Imaging    No results found.   Medications:     Current Medications: . aspirin  81 mg Oral Pre-Cath  . sodium chloride flush  3 mL Intravenous Q12H     Infusions: . sodium chloride    . sodium chloride    . milrinone         Patient Profile   51 yo with history of CAD s/p anterior MI in 2010 and chronic systolic CHF (ischemic cardiomyopathy) as well as CKD stage III is admittted today for hemodynamic optimization prior to LVAD placement.   Assessment/Plan   1. Chronic systolic CHF: Ischemic cardiomyopathy.  St Jude ICD.  NSTEMI in March 2018 with DES to LAD and RCA, complicated by cardiogenic shock, low output requiring milrinone.  Most recent echo 8/18 with EF 20-25%, moderate MR.  NYHA class IIIb-IV symptoms (fluctuate).  I think he is nearing the need for LVAD placement.  CPX in 8/18 showed a severe functional impairment from HF and creatinine is worsening, likely cardiorenal.  NYHA class IIIb-IV, fluctuates.  He has seen Dr. Prescott [name].  He still needs dental extractions.  He had RHC today showing optimized filling pressure and  marginal cardiac index (2.0 by thermodilution).  [name] was left in place. Creatinine is 2.09 today.  - I will start milrinone at 0.125 mcg/kg/min to try to optimize cardiac output and renal function prior to LVAD placement.   - Hold torsemide today, will dose based on CVP and PCWP as [name] has been left in place.   - Continue Bidil 1 tabs tid today.   - Continue low dose Coreg.  - Continue digoxin, check level in am.   - Can continue low dose spironolactone.   - Not on ACEI/ARB/ARNI with CKD.  - Narrow QRS, not CRT candidate.   - He is off Plavix for washout prior to LVAD (now > 6 month post-PCI).  He has been on Lovenox bridge off warfarin.  Needs teeth extractions tomorrow with Dr. Enrique Sack.  Tentative plan for LVAD (HM III) Monday, will need evaluation by Dr. Prescott [name].  2. CKD stage III: Suspect a cardiorenal component given worsening over the last few months though diabetic nephropathy likely plays a role.  Creatinine 2.09.   - Start milrinone at low dose to try to optimize creatinine, will also hold diuretics with good PCWP and CVP.  3. CAD: NSTEMI in 3/18. LHC with 99% ulcerated lesion proximal RCA with left to right collaterals, 95% mid LAD stenosis after mid LAD stent. s/p PCI to RCA and LAD on 11/25/16.  No exertional chest pain.  - He should be able to stop Plavix in the setting of warfarin use relatively safely 6 months post-PCI. -  Continue Coreg as above.  - Continue high intensity statin, good lipids recently.  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 Recent V/Q scan did not show acute or chronic PE.  He is a heterozygote for Factor V Leiden, plan for for ASA 325 and warfarin INR 2-2.5 when LVAD placed.  Will cover with heparin gtt pre-op.  5. Chronic N/V/D with gastroparesis: Follows with GI. Quiescent currently.   6. DM: On Jardiance at home, will cover with sliding scale and ask diabetes coordinator to see.   Length of Stay: 0  Loralie Champagne, MD  06/07/2017, 11:01  AM  Advanced Heart Failure Team Pager [phone] (M-F; 7a - 4p)  Please contact [facility] for night-coverage after hours (4p -7a ) and weekends on [email or web address]

## 2017-06-07 NOTE — Progress Notes (Addendum)
ANTICOAGULATION CONSULT NOTE - Initial Consult  Pharmacy Consult for heparin Indication: DVT  Allergies  Allergen Reactions  . Metformin And Related Diarrhea    Patient Measurements: Height: 5' 8"  (172.7 cm) Weight: 189 lb (85.7 kg) IBW/kg (Calculated) : 68.4 Heparin Dosing Weight: 85 kg  Vital Signs: Temp: 98.5 F (36.9 C) (10/03 0631) Temp Source: Oral (10/03 0631) BP: 169/80 (10/03 1155) Pulse Rate: 71 (10/03 1155)  Labs:  Recent Labs  06/07/17 0652 06/07/17 0736  LABPROT  --  12.9  INR  --  0.98  CREATININE 2.20* 2.09*    Estimated Creatinine Clearance: 44 mL/min (A) (by C-G formula based on SCr of 2.09 mg/dL (H)).   Medical History: Past Medical History:  Diagnosis Date  . Angina   . ASCVD (arteriosclerotic cardiovascular disease)    , Anterior infarction 2005, LAD diagonal bifurcation intervention 03/2004  . Automatic implantable cardiac defibrillator -St. Jude's       . Benign neoplasm of colon   . CHF (congestive heart failure) ([place])   . Chronic systolic heart failure ([place])   . Coronary artery disease     Widely patent previously placed stents in the left anterior   . Crohn's disease ([place])   . 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 Dyspnea   . Gastroparesis   . GERD (gastroesophageal reflux disease)   . High cholesterol   . Hyperlipidemia   . Hypersomnolent    Previous diagnosis of narcolepsy  . Hypertension, essential   . Ischemic cardiomyopathy    Ejection fraction 15-20% catheterization 2010  . Type II or unspecified type diabetes mellitus without mention of complication, not stated as uncontrolled   . Unspecified gastritis and gastroduodenitis without mention of hemorrhage     Assessment: 57 yoM admitted for heart cath for LVAD workup. Pt on warfarin PTA for DVT 8/18 now to transition to heparin per pharmacy 2 hours after cath. Pt bridged with Lovenox outpatient prior to heart cath with last dose 10/2 am, INR 0.98 this  morning.  ADDENDUM: Discussed with Dr. Enrique Sack from dental - heparin to stop tomorrow morning at 0130 for dental procedure tomorrow morning. Ok to resume heparin 12 hours after dental procedure with no bolus.  Goal of Therapy:  Heparin Level 0.3-0.7 Monitor platelets by anticoagulation protocol: Yes   Plan:  -Heparin 1200 units/hr -Check 6-hr heparin level -Turn off heparin at 0130 10/4 -Monitor heparin level, CBC, S/Sx bleeding daily  Arrie Senate, PharmD PGY-2 Cardiology Pharmacy Resident Pager: [phone] 06/07/2017

## 2017-06-07 NOTE — Interval H&P Note (Signed)
History and Physical Interval Note:  06/07/2017 10:12 AM  [name]  has presented today for surgery, with the diagnosis of hf  The various methods of treatment have been discussed with the patient and family. After consideration of risks, benefits and other options for treatment, the patient has consented to  Procedure(s): RIGHT HEART CATH (N/A) as a surgical intervention .  The patient's history has been reviewed, patient examined, no change in status, stable for surgery.  I have reviewed the patient's chart and labs.  Questions were answered to the patient's satisfaction.     [name] [employer]

## 2017-06-07 NOTE — Progress Notes (Signed)
ANTICOAGULATION CONSULT NOTE - Initial Consult  Pharmacy Consult for heparin Indication: DVT  Allergies  Allergen Reactions  . Metformin And Related Diarrhea    Patient Measurements: Height: 5' 8"  (172.7 cm) Weight: 191 lb 9.3 oz (86.9 kg) IBW/kg (Calculated) : 68.4 Heparin Dosing Weight: 85 kg  Vital Signs: Temp: 98.2 F (36.8 C) (10/03 2200) Temp Source: Core (Comment) (10/03 2000) BP: 115/79 (10/03 2200) Pulse Rate: 81 (10/03 2200)  Labs:  Recent Labs  06/07/17 0652 06/07/17 0736 06/07/17 2150  LABPROT  --  12.9  --   INR  --  0.98  --   HEPARINUNFRC  --   --  0.36  CREATININE 2.20* 2.09*  --     Estimated Creatinine Clearance: 44.3 mL/min (A) (by C-G formula based on SCr of 2.09 mg/dL (H)).   Medical History: Past Medical History:  Diagnosis Date  . Angina   . ASCVD (arteriosclerotic cardiovascular disease)    , Anterior infarction 2005, LAD diagonal bifurcation intervention 03/2004  . Automatic implantable cardiac defibrillator -St. Jude's       . Benign neoplasm of colon   . CHF (congestive heart failure) ([place])   . Chronic systolic heart failure ([place])   . Coronary artery disease     Widely patent previously placed stents in the left anterior   . Crohn's disease ([place])   . 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 Dyspnea   . Gastroparesis   . GERD (gastroesophageal reflux disease)   . High cholesterol   . Hyperlipidemia   . Hypersomnolent    Previous diagnosis of narcolepsy  . Hypertension, essential   . Ischemic cardiomyopathy    Ejection fraction 15-20% catheterization 2010  . Type II or unspecified type diabetes mellitus without mention of complication, not stated as uncontrolled   . Unspecified gastritis and gastroduodenitis without mention of hemorrhage     Assessment: 36 yoM admitted for heart cath for LVAD workup. Pt on warfarin PTA for DVT 8/18 now to transition to heparin per pharmacy. Pt was bridged with Lovenox  outpatient prior to heart cath with last dose 10/2 am, INR 0.98 this morning. It was discussed with Dr. Enrique Sack from dental - heparin to stop morning of 10/4 at 0130 for dental procedure. Ok to resume heparin 12 hours after dental procedure with no bolus.  Heparin level came back at 0.36, within goal range. No issues noted by nursing. No signs/symptoms of bleeding.   Goal of Therapy:  Heparin Level 0.3-0.7 Monitor platelets by anticoagulation protocol: Yes   Plan:  -Continue heparin at 1200 units/hr -Turn off heparin at 0130 10/4 >> will follow up after dental procedure for restart -Monitor heparin level, CBC, S/Sx bleeding daily  Doylene Canard, PharmD Clinical Pharmacist  Phone: [phone] 06/07/2017

## 2017-06-07 NOTE — Progress Notes (Signed)
Peripherally Inserted Central Catheter/Midline Placement  The IV Nurse has discussed with the patient and/or persons authorized to consent for the patient, the purpose of this procedure and the potential benefits and risks involved with this procedure.  The benefits include less needle sticks, lab draws from the catheter, and the patient may be discharged home with the catheter. Risks include, but not limited to, infection, bleeding, blood clot (thrombus formation), and puncture of an artery; nerve damage and irregular heartbeat and possibility to perform a PICC exchange if needed/ordered by physician.  Alternatives to this procedure were also discussed.  Bard Power PICC patient education guide, fact sheet on infection prevention and patient information card has been provided to patient /or left at bedside.    PICC/Midline Placement Documentation        Frances Maywood 06/07/2017, 9:30 AM

## 2017-06-08 ENCOUNTER — Inpatient Hospital Stay (HOSPITAL_COMMUNITY): Payer: BLUE CROSS/BLUE SHIELD

## 2017-06-08 ENCOUNTER — Inpatient Hospital Stay (HOSPITAL_COMMUNITY): Admission: RE | Admit: 2017-06-08 | Payer: BLUE CROSS/BLUE SHIELD | Source: Ambulatory Visit | Admitting: Dentistry

## 2017-06-08 ENCOUNTER — Encounter (HOSPITAL_COMMUNITY)
Admission: AD | Disposition: A | Discharge status: Home Health | Payer: Self-pay | Source: Ambulatory Visit | Attending: Cardiology

## 2017-06-08 ENCOUNTER — Inpatient Hospital Stay (HOSPITAL_COMMUNITY): Payer: BLUE CROSS/BLUE SHIELD | Admitting: Certified Registered"

## 2017-06-08 ENCOUNTER — Encounter (HOSPITAL_COMMUNITY): Payer: Self-pay | Admitting: Certified Registered"

## 2017-06-08 DIAGNOSIS — K0889 Other specified disorders of teeth and supporting structures: Secondary | ICD-10-CM | POA: Diagnosis present

## 2017-06-08 DIAGNOSIS — K029 Dental caries, unspecified: Secondary | ICD-10-CM | POA: Diagnosis present

## 2017-06-08 DIAGNOSIS — M270 Developmental disorders of jaws: Secondary | ICD-10-CM | POA: Diagnosis present

## 2017-06-08 DIAGNOSIS — K045 Chronic apical periodontitis: Secondary | ICD-10-CM | POA: Diagnosis present

## 2017-06-08 DIAGNOSIS — K0530 Chronic periodontitis, unspecified: Secondary | ICD-10-CM | POA: Diagnosis present

## 2017-06-08 DIAGNOSIS — K083 Retained dental root: Secondary | ICD-10-CM | POA: Diagnosis present

## 2017-06-08 HISTORY — PX: MULTIPLE EXTRACTIONS WITH ALVEOLOPLASTY: SHX5342

## 2017-06-08 LAB — GLUCOSE, CAPILLARY
GLUCOSE-CAPILLARY: 134 mg/dL — AB (ref 65–99)
GLUCOSE-CAPILLARY: 95 mg/dL (ref 65–99)
GLUCOSE-CAPILLARY: 94 mg/dL (ref 65–99)
GLUCOSE-CAPILLARY: 91 mg/dL (ref 65–99)
Glucose-Capillary: 72 mg/dL (ref 65–99)
Glucose-Capillary: 148 mg/dL — ABNORMAL HIGH (ref 65–99)

## 2017-06-08 LAB — COOXEMETRY PANEL
Carboxyhemoglobin: 0.9 % (ref 0.5–1.5)
Methemoglobin: 1.6 % — ABNORMAL HIGH (ref 0.0–1.5)
O2 SAT: 72.8 %
Total hemoglobin: 11.4 g/dL — ABNORMAL LOW (ref 12.0–16.0)

## 2017-06-08 LAB — COMPREHENSIVE METABOLIC PANEL
ALBUMIN: 2.8 g/dL — AB (ref 3.5–5.0)
ALK PHOS: 113 U/L (ref 38–126)
ALT: 43 U/L (ref 17–63)
ANION GAP: 8 (ref 5–15)
AST: 61 U/L — ABNORMAL HIGH (ref 15–41)
BUN: 37 mg/dL — ABNORMAL HIGH (ref 6–20)
CHLORIDE: 105 mmol/L (ref 101–111)
CO2: 29 mmol/L (ref 22–32)
Calcium: 8.4 mg/dL — ABNORMAL LOW (ref 8.9–10.3)
Creatinine, Ser: 1.55 mg/dL — ABNORMAL HIGH (ref 0.61–1.24)
GFR calc Af Amer: 58 mL/min — ABNORMAL LOW (ref >60)
GFR calc non Af Amer: 50 mL/min — ABNORMAL LOW (ref >60)
GLUCOSE: 84 mg/dL (ref 65–99)
POTASSIUM: 3.2 mmol/L — AB (ref 3.5–5.1)
SODIUM: 142 mmol/L (ref 135–145)
Total Bilirubin: 0.7 mg/dL (ref 0.3–1.2)
Total Protein: 6.4 g/dL — ABNORMAL LOW (ref 6.5–8.1)

## 2017-06-08 LAB — CBC WITH DIFFERENTIAL/PLATELET
Basophils Absolute: 0 10*3/uL (ref 0.0–0.1)
Basophils Relative: 0 %
Eosinophils Absolute: 0.4 10*3/uL (ref 0.0–0.7)
Eosinophils Relative: 5 %
HEMATOCRIT: 30.9 % — AB (ref 39.0–52.0)
Hemoglobin: 9.9 g/dL — ABNORMAL LOW (ref 13.0–17.0)
LYMPHS ABS: 1.4 10*3/uL (ref 0.7–4.0)
LYMPHS PCT: 19 %
MCH: 25.9 pg — ABNORMAL LOW (ref 26.0–34.0)
MCHC: 32 g/dL (ref 30.0–36.0)
MCV: 80.9 fL (ref 78.0–100.0)
MONO ABS: 0.6 10*3/uL (ref 0.1–1.0)
MONOS PCT: 8 %
NEUTROS ABS: 5.1 10*3/uL (ref 1.7–7.7)
Neutrophils Relative %: 68 %
PLATELETS: 207 10*3/uL (ref 150–400)
RBC: 3.82 MIL/uL — ABNORMAL LOW (ref 4.22–5.81)
RDW: 15.6 % — AB (ref 11.5–15.5)
WBC: 7.4 10*3/uL (ref 4.0–10.5)

## 2017-06-08 LAB — DIGOXIN LEVEL: Digoxin Level: 0.5 ng/mL — ABNORMAL LOW (ref 0.8–2.0)

## 2017-06-08 SURGERY — MULTIPLE EXTRACTION WITH ALVEOLOPLASTY
Anesthesia: General

## 2017-06-08 MED ORDER — MORPHINE SULFATE (PF) 2 MG/ML IV SOLN: 2.0000 mg | INTRAVENOUS | Status: DC | PRN

## 2017-06-08 MED ORDER — FENTANYL CITRATE (PF) 100 MCG/2ML IJ SOLN
25.0000 ug | INTRAMUSCULAR | Status: DC | PRN
  Administered 2017-06-08 (×2): 25 ug via INTRAVENOUS

## 2017-06-08 MED ORDER — MORPHINE SULFATE (PF) 4 MG/ML IV SOLN: 2.0000 mg | INTRAVENOUS | Status: DC | PRN

## 2017-06-08 MED ORDER — SUGAMMADEX SODIUM 200 MG/2ML IV SOLN
INTRAVENOUS | Status: DC | PRN
  Administered 2017-06-08: 200 mg via INTRAVENOUS

## 2017-06-08 MED ORDER — MIDAZOLAM HCL 5 MG/5ML IJ SOLN
INTRAMUSCULAR | Status: DC | PRN
  Administered 2017-06-08 (×2): 1 mg via INTRAVENOUS

## 2017-06-08 MED ORDER — DEXAMETHASONE SODIUM PHOSPHATE 10 MG/ML IJ SOLN
INTRAMUSCULAR | Status: DC | PRN
  Administered 2017-06-08: 5 mg via INTRAVENOUS

## 2017-06-08 MED ORDER — LIDOCAINE 2% (20 MG/ML) 5 ML SYRINGE
INTRAMUSCULAR | Status: AC
  Filled 2017-06-08: qty 5

## 2017-06-08 MED ORDER — MORPHINE SULFATE (PF) 4 MG/ML IV SOLN
INTRAVENOUS | Status: AC
  Administered 2017-06-08: 4 mg
  Filled 2017-06-08: qty 1

## 2017-06-08 MED ORDER — FENTANYL CITRATE (PF) 100 MCG/2ML IJ SOLN
INTRAMUSCULAR | Status: AC
  Filled 2017-06-08: qty 2

## 2017-06-08 MED ORDER — OXYMETAZOLINE HCL 0.05 % NA SOLN
NASAL | Status: AC
  Filled 2017-06-08: qty 15

## 2017-06-08 MED ORDER — LIDOCAINE-EPINEPHRINE 2 %-1:100000 IJ SOLN
INTRAMUSCULAR | Status: AC
  Filled 2017-06-08: qty 10.2

## 2017-06-08 MED ORDER — POTASSIUM CHLORIDE 10 MEQ/50ML IV SOLN
10.0000 meq | INTRAVENOUS | Status: AC
  Administered 2017-06-08 (×4): 10 meq via INTRAVENOUS
  Filled 2017-06-08 (×4): qty 50

## 2017-06-08 MED ORDER — SUGAMMADEX SODIUM 200 MG/2ML IV SOLN
INTRAVENOUS | Status: AC
  Filled 2017-06-08: qty 2

## 2017-06-08 MED ORDER — CEFAZOLIN SODIUM 1 G IJ SOLR
INTRAMUSCULAR | Status: AC
  Filled 2017-06-08: qty 20

## 2017-06-08 MED ORDER — ONDANSETRON HCL 4 MG/2ML IJ SOLN
INTRAMUSCULAR | Status: AC
  Filled 2017-06-08: qty 2

## 2017-06-08 MED ORDER — BUPIVACAINE-EPINEPHRINE 0.5% -1:200000 IJ SOLN
INTRAMUSCULAR | Status: DC | PRN
  Administered 2017-06-08: 3.6 mL

## 2017-06-08 MED ORDER — BUPIVACAINE-EPINEPHRINE (PF) 0.5% -1:200000 IJ SOLN
INTRAMUSCULAR | Status: AC
  Filled 2017-06-08: qty 3.6

## 2017-06-08 MED ORDER — HYDRALAZINE HCL 20 MG/ML IJ SOLN
10.0000 mg | INTRAMUSCULAR | Status: DC | PRN
Start: 2017-06-08 — End: 2017-06-12

## 2017-06-08 MED ORDER — DEXAMETHASONE SODIUM PHOSPHATE 10 MG/ML IJ SOLN
INTRAMUSCULAR | Status: AC
  Filled 2017-06-08: qty 1

## 2017-06-08 MED ORDER — LIDOCAINE 2% (20 MG/ML) 5 ML SYRINGE
INTRAMUSCULAR | Status: DC | PRN
  Administered 2017-06-08: 60 mg via INTRAVENOUS

## 2017-06-08 MED ORDER — LIDOCAINE-EPINEPHRINE 2 %-1:100000 IJ SOLN
INTRAMUSCULAR | Status: DC | PRN
  Administered 2017-06-08: 10.8 mL via INTRADERMAL

## 2017-06-08 MED ORDER — ONDANSETRON HCL 4 MG/2ML IJ SOLN: 4.0000 mg | Freq: Once | INTRAMUSCULAR | Status: DC | PRN

## 2017-06-08 MED ORDER — PROPOFOL 10 MG/ML IV BOLUS
INTRAVENOUS | Status: DC | PRN
  Administered 2017-06-08 (×2): 40 mg via INTRAVENOUS

## 2017-06-08 MED ORDER — CHLORHEXIDINE GLUCONATE CLOTH 2 % EX PADS
6.0000 | MEDICATED_PAD | Freq: Every day | CUTANEOUS | Status: DC
  Administered 2017-06-09 – 2017-06-11 (×3): 6 via TOPICAL

## 2017-06-08 MED ORDER — ROCURONIUM BROMIDE 10 MG/ML (PF) SYRINGE
PREFILLED_SYRINGE | INTRAVENOUS | Status: DC | PRN
  Administered 2017-06-08: 60 mg via INTRAVENOUS

## 2017-06-08 MED ORDER — FENTANYL CITRATE (PF) 100 MCG/2ML IJ SOLN
INTRAMUSCULAR | Status: DC | PRN
Start: 2017-06-08 — End: 2017-06-08
  Administered 2017-06-08: 50 ug via INTRAVENOUS
  Administered 2017-06-08: 100 ug via INTRAVENOUS

## 2017-06-08 MED ORDER — HEPARIN (PORCINE) IN NACL 100-0.45 UNIT/ML-% IJ SOLN
1600.0000 [IU]/h | INTRAMUSCULAR | Status: DC
  Administered 2017-06-09 (×2): 1200 [IU]/h via INTRAVENOUS
  Administered 2017-06-10: 1350 [IU]/h via INTRAVENOUS
  Administered 2017-06-11: 1450 [IU]/h via INTRAVENOUS
  Filled 2017-06-08 (×4): qty 250

## 2017-06-08 MED ORDER — HEMOSTATIC AGENTS (NO CHARGE) OPTIME
TOPICAL | Status: DC | PRN
  Administered 2017-06-08: 1 via TOPICAL

## 2017-06-08 MED ORDER — MIDAZOLAM HCL 2 MG/2ML IJ SOLN
INTRAMUSCULAR | Status: AC
  Filled 2017-06-08: qty 2

## 2017-06-08 MED ORDER — 0.9 % SODIUM CHLORIDE (POUR BTL) OPTIME
TOPICAL | Status: DC | PRN
  Administered 2017-06-08: 1000 mL

## 2017-06-08 MED ORDER — OXYCODONE-ACETAMINOPHEN 5-325 MG PO TABS
1.0000 | ORAL_TABLET | ORAL | Status: DC | PRN
  Administered 2017-06-08: 2 via ORAL
  Administered 2017-06-08: 1 via ORAL
  Administered 2017-06-09 – 2017-06-10 (×6): 2 via ORAL
  Filled 2017-06-08 (×2): qty 2
  Filled 2017-06-08: qty 1
  Filled 2017-06-08 (×5): qty 2

## 2017-06-08 MED ORDER — ROCURONIUM BROMIDE 10 MG/ML (PF) SYRINGE
PREFILLED_SYRINGE | INTRAVENOUS | Status: AC
  Filled 2017-06-08: qty 5

## 2017-06-08 MED ORDER — POTASSIUM CHLORIDE CRYS ER 20 MEQ PO TBCR: 40.0000 meq | EXTENDED_RELEASE_TABLET | Freq: Once | ORAL | Status: DC

## 2017-06-08 MED ORDER — FENTANYL CITRATE (PF) 250 MCG/5ML IJ SOLN
INTRAMUSCULAR | Status: AC
Start: 2017-06-08 — End: 2017-06-08
  Filled 2017-06-08: qty 5

## 2017-06-08 MED ORDER — PROPOFOL 10 MG/ML IV BOLUS
INTRAVENOUS | Status: AC
  Filled 2017-06-08: qty 20

## 2017-06-08 MED ORDER — AMINOCAPROIC ACID SOLUTION 5% (50 MG/ML)
10.0000 mL | ORAL | Status: DC
  Administered 2017-06-08: 10 mL via ORAL

## 2017-06-08 MED ORDER — LACTATED RINGERS IV SOLN
INTRAVENOUS | Status: DC | PRN
  Administered 2017-06-08: 07:00:00 via INTRAVENOUS

## 2017-06-08 MED ORDER — AMINOCAPROIC ACID SOLUTION 5% (50 MG/ML)
10.0000 mL | ORAL | Status: AC
  Administered 2017-06-08 (×6): 10 mL via ORAL
  Filled 2017-06-08: qty 100

## 2017-06-08 MED ORDER — FENTANYL CITRATE (PF) 100 MCG/2ML IJ SOLN
25.0000 ug | INTRAMUSCULAR | Status: DC | PRN
  Administered 2017-06-08 – 2017-06-11 (×14): 25 ug via INTRAVENOUS
  Filled 2017-06-08 (×13): qty 2

## 2017-06-08 MED ORDER — LACTATED RINGERS IV SOLN: INTRAVENOUS | Status: DC

## 2017-06-08 SURGICAL SUPPLY — 37 items
ALCOHOL 70% 16 OZ (MISCELLANEOUS) ×2 IMPLANT
ATTRACTOMAT 16X20 MAGNETIC DRP (DRAPES) ×2 IMPLANT
BLADE SURG 15 STRL LF DISP TIS (BLADE) ×2 IMPLANT
BLADE SURG 15 STRL SS (BLADE) ×4
COVER SURGICAL LIGHT HANDLE (MISCELLANEOUS) ×2 IMPLANT
GAUZE PACKING FOLDED 2  STR (GAUZE/BANDAGES/DRESSINGS) ×1
GAUZE PACKING FOLDED 2 STR (GAUZE/BANDAGES/DRESSINGS) ×1 IMPLANT
GAUZE SPONGE 4X4 16PLY XRAY LF (GAUZE/BANDAGES/DRESSINGS) ×3 IMPLANT
GLOVE BIOGEL PI IND STRL 6 (GLOVE) ×1 IMPLANT
GLOVE BIOGEL PI INDICATOR 6 (GLOVE) ×1
GLOVE SURG ORTHO 8.0 STRL STRW (GLOVE) ×2 IMPLANT
GLOVE SURG SS PI 6.0 STRL IVOR (GLOVE) ×2 IMPLANT
GOWN STRL REUS W/ TWL LRG LVL3 (GOWN DISPOSABLE) ×1 IMPLANT
GOWN STRL REUS W/TWL 2XL LVL3 (GOWN DISPOSABLE) ×2 IMPLANT
GOWN STRL REUS W/TWL LRG LVL3 (GOWN DISPOSABLE) ×2
HEMOSTAT SURGICEL 2X14 (HEMOSTASIS) ×2 IMPLANT
KIT BASIN OR (CUSTOM PROCEDURE TRAY) ×2 IMPLANT
KIT ROOM TURNOVER OR (KITS) ×2 IMPLANT
MANIFOLD NEPTUNE WASTE (CANNULA) ×2 IMPLANT
NDL BLUNT 16X1.5 OR ONLY (NEEDLE) ×1 IMPLANT
NEEDLE BLUNT 16X1.5 OR ONLY (NEEDLE) ×2 IMPLANT
NS IRRIG 1000ML POUR BTL (IV SOLUTION) ×2 IMPLANT
PACK EENT II TURBAN DRAPE (CUSTOM PROCEDURE TRAY) ×2 IMPLANT
PAD ARMBOARD 7.5X6 YLW CONV (MISCELLANEOUS) ×2 IMPLANT
SPONGE SURGIFOAM ABS GEL 100 (HEMOSTASIS) IMPLANT
SPONGE SURGIFOAM ABS GEL 12-7 (HEMOSTASIS) IMPLANT
SPONGE SURGIFOAM ABS GEL SZ50 (HEMOSTASIS) IMPLANT
SUCTION FRAZIER HANDLE 10FR (MISCELLANEOUS) ×1
SUCTION TUBE FRAZIER 10FR DISP (MISCELLANEOUS) ×1 IMPLANT
SUT CHROMIC 3 0 PS 2 (SUTURE) ×6 IMPLANT
SUT CHROMIC 4 0 P 3 18 (SUTURE) ×2 IMPLANT
SYR 50ML SLIP (SYRINGE) ×2 IMPLANT
TOWEL OR 17X26 10 PK STRL BLUE (TOWEL DISPOSABLE) ×2 IMPLANT
TUBE CONNECTING 12X1/4 (SUCTIONS) ×2 IMPLANT
WATER STERILE IRR 1000ML POUR (IV SOLUTION) ×2 IMPLANT
WATER TABLETS ICX (MISCELLANEOUS) ×2 IMPLANT
YANKAUER SUCT BULB TIP NO VENT (SUCTIONS) ×2 IMPLANT

## 2017-06-08 NOTE — Progress Notes (Signed)
ANTICOAGULATION CONSULT NOTE - [place] for heparin Indication: DVT  Allergies  Allergen Reactions  . Metformin And Related Diarrhea    Patient Measurements: Height: 5' 8"  (172.7 cm) Weight: 192 lb 14.4 oz (87.5 kg) IBW/kg (Calculated) : 68.4 Heparin Dosing Weight: 85 kg  Vital Signs: Temp: 97.9 F (36.6 C) (10/04 1115) Temp Source: Core (Comment) (10/04 0400) BP: 156/77 (10/04 1107) Pulse Rate: 85 (10/04 1115)  Labs:  Recent Labs  06/07/17 0652 06/07/17 0736 06/07/17 2150 06/08/17 0419  HGB  --   --   --  9.9*  HCT  --   --   --  30.9*  PLT  --   --   --  207  LABPROT  --  12.9  --   --   INR  --  0.98  --   --   HEPARINUNFRC  --   --  0.36  --   CREATININE 2.20* 2.09*  --  1.55*    Estimated Creatinine Clearance: 59.9 mL/min (A) (by C-G formula based on SCr of 1.55 mg/dL (H)).   Medical History: Past Medical History:  Diagnosis Date  . Angina   . ASCVD (arteriosclerotic cardiovascular disease)    , Anterior infarction 2005, LAD diagonal bifurcation intervention 03/2004  . Automatic implantable cardiac defibrillator -St. Jude's       . Benign neoplasm of colon   . CHF (congestive heart failure) ([place])   . Chronic systolic heart failure ([place])   . Coronary artery disease     Widely patent previously placed stents in the left anterior   . Crohn's disease ([place])   . 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 Dyspnea   . Gastroparesis   . GERD (gastroesophageal reflux disease)   . High cholesterol   . Hyperlipidemia   . Hypersomnolent    Previous diagnosis of narcolepsy  . Hypertension, essential   . Ischemic cardiomyopathy    Ejection fraction 15-20% catheterization 2010  . Type II or unspecified type diabetes mellitus without mention of complication, not stated as uncontrolled   . Unspecified gastritis and gastroduodenitis without mention of hemorrhage     Assessment: 4 yoM admitted for LVAD workup. Pt on  warfarin PTA for recurrent DVTs now to transition to heparin per pharmacy. Pt now s/p dental extractions and per DDS okay to resume heparin drip with no bolus tonight at 2200 if oral bleeding is well-controlled. Heparin previously therapeutic on 1200 units/hr.  Goal of Therapy:  Heparin Level 0.3-0.7 Monitor platelets by anticoagulation protocol: Yes   Plan:  -Hold heparin for now -If no significant bleeding at 2200, resume heparin with no bolus at 1200 units/hr -6-hr heparin level -Monitor heparin level, CBC, S/Sx bleeding daily  Arrie Senate, PharmD PGY-2 Cardiology Pharmacy Resident Pager: [phone] 06/08/2017

## 2017-06-08 NOTE — Anesthesia Procedure Notes (Signed)
Arterial Line Insertion Start/End10/12/2016 7:10 AM, 06/08/2017 7:15 AM Performed by: Moshe Salisbury, CRNA  Patient location: Pre-op. Preanesthetic checklist: patient identified, IV checked, site marked, risks and benefits discussed, surgical consent, monitors and equipment checked, pre-op evaluation, timeout performed and anesthesia consent Lidocaine 1% used for infiltration and patient sedated Left, radial was placed Catheter size: 20 G Hand hygiene performed , maximum sterile barriers used  and Seldinger technique used Allen's test indicative of satisfactory collateral circulation Attempts: 1 Procedure performed without using ultrasound guided technique. Following insertion, dressing applied and Biopatch. Post procedure assessment: normal  Patient tolerated the procedure well with no immediate complications.

## 2017-06-08 NOTE — Anesthesia Procedure Notes (Signed)
Procedure Name: Intubation Date/Time: 06/08/2017 8:05 AM Performed by: Melina Copa, Jantzen Pilger R Pre-anesthesia Checklist: Patient identified, Emergency Drugs available, Suction available, Patient being monitored and Timeout performed Patient Re-evaluated:Patient Re-evaluated prior to induction Oxygen Delivery Method: Circle system utilized Preoxygenation: Pre-oxygenation with 100% oxygen Induction Type: IV induction Ventilation: Mask ventilation without difficulty Laryngoscope Size: Mac and 4 Grade View: Grade II Nasal Tubes: Left, Nasal prep performed and Magill forceps- large, utilized Tube size: 7.5 mm Number of attempts: 1 Intubation method: 14 fr red rubber catheter place on end of nasal RAE to facilitate passage through nose. Placement Confirmation: ETT inserted through vocal cords under direct vision,  positive ETCO2 and breath sounds checked- equal and bilateral Secured at: 29 cm Tube secured with: Tape Dental Injury: Teeth and Oropharynx as per pre-operative assessment

## 2017-06-08 NOTE — Progress Notes (Signed)
CSW met with patient and friend at bedside. Patient had teeth extracted this morning so difficulties in conversing but able to write. Patient stated that he completed his AD and was added to his medical record. Patient denies any concerns at upcoming VAD implant scheduled for Monday. Patient gives thumbs up in response to question are you ready for surgery. Patient and family deny any concerns at this time and verbalize understanding of plan for surgery on Monday. CSW will follow for VAD implant and supportive needs as identified. Raquel Sarna, [place], [place]

## 2017-06-08 NOTE — Progress Notes (Signed)
PRE-OPERATIVE NOTE:  06/08/2017 [name] [id number]  VITALS: BP 125/72 (BP Location: Right Arm)   Pulse 76   Temp 98.1 F (36.7 C) (Core (Comment))   Resp (!) 8   Ht 5' 8"  (1.727 m)   Wt 192 lb 14.4 oz (87.5 kg)   SpO2 95%   BMI 29.33 kg/m   Lab Results  Component Value Date   WBC 7.4 06/08/2017   HGB 9.9 (L) 06/08/2017   HCT 30.9 (L) 06/08/2017   MCV 80.9 06/08/2017   PLT 207 06/08/2017   BMET    Component Value Date/Time   NA 142 06/08/2017 0419   NA 144 05/11/2017 0938   K 3.2 (L) 06/08/2017 0419   CL 105 06/08/2017 0419   CO2 29 06/08/2017 0419   GLUCOSE 84 06/08/2017 0419   BUN 37 (H) 06/08/2017 0419   BUN 68 (H) 05/11/2017 0938   CREATININE 1.55 (H) 06/08/2017 0419   CREATININE 1.63 (H) 11/09/2016 1505   CALCIUM 8.4 (L) 06/08/2017 0419   GFRNONAA 50 (L) 06/08/2017 0419   GFRNONAA 48 (L) 11/09/2016 1505   GFRAA 58 (L) 06/08/2017 0419   GFRAA 55 (L) 11/09/2016 1505    Lab Results  Component Value Date   INR 0.98 06/07/2017   INR 1.58 05/04/2017   INR 1.7 04/26/2017   No results found for: PTT   [name] presents for multiple dental extractions with alveoloplasty and pre-prosthetic surgery as needed in the operating room with general anesthesia.   SUBJECTIVE: The patient denies any acute medical or dental changes and agrees to proceed with treatment as planned.  EXAM: No sign of acute dental changes.  ASSESSMENT: Patient is affected by chronic apical periodontitis, dental caries, multiple retained root segments, chronic periodontitis, loose teeth, and bilateral mandibular lingual tori.  PLAN: Patient agrees to proceed with treatment as planned in the operating room as previously discussed and accepts the risks, benefits, and complications of the proposed treatment. Patient is aware of the risk for bleeding, bruising, swelling, infection, pain, nerve damage, soft tissue damage,sinus involvement, root tip fracture, mandible  fracture, and the risks of complications associated with the anesthesia. Patient also is aware of the potential for other complications up to and including death due to his overall cardiovascular and medical compromise.   Lenn Cal, DDS

## 2017-06-08 NOTE — Transfer of Care (Signed)
Immediate Anesthesia Transfer of Care Note  Patient: [name]  Procedure(s) Performed: MULTIPLE EXTRACTION WITH ALVEOLOPLASTY AND PRE PROSTHETIC SURGERY AS NEEDED (N/A )  Patient Location: PACU  Anesthesia Type:General  Level of Consciousness: drowsy and patient cooperative  Airway & Oxygen Therapy: Patient Spontanous Breathing and Patient connected to nasal cannula oxygen  Post-op Assessment: Report given to RN, Post -op Vital signs reviewed and stable and Patient moving all extremities  Post vital signs: Reviewed and stable  Last Vitals:  Vitals:   06/08/17 0500 06/08/17 0600  BP: 115/65 (!) 145/85  Pulse: 75 79  Resp: 10 (!) 9  Temp: 36.6 C 36.6 C  SpO2: 95% 97%    Last Pain:  Vitals:   06/08/17 0400  TempSrc: Core (Comment)  PainSc:       Patients Stated Pain Goal: 0 (56/38/93 7342)  Complications: No apparent anesthesia complications

## 2017-06-08 NOTE — Op Note (Signed)
OPERATIVE REPORT  Patient:            [name] Date of Birth:  03-Aug-1966 MRN:                [id number]   DATE OF PROCEDURE:  06/08/2017  PREOPERATIVE DIAGNOSES: 1. Ischemic cardiomyopathy 2. Pre-LVAD Placement dental protocol  3. Chronic apical periodontitis 4. Dental caries 5. Retained root segments 6. Chronic periodontitis 7. Loose teeth 8. Bilateral mandibular lingual tori  POSTOPERATIVE DIAGNOSES: 1. Ischemic cardiomyopathy 2. Pre-LVAD Placement dental protocol  3. Chronic apical periodontitis 4. Dental caries 5. Retained root segments 6. Chronic periodontitis 7. Loose teeth 8. Bilateral mandibular lingual tori  OPERATIONS: 1. Multiple extraction of tooth numbers 4-15, 19-23, and 27-29 2. Four Quadrants of alveoloplasty 3. Bilateral mandibular lingual tori reductions   SURGEON: Lenn Cal, DDS  ASSISTANT: Camie Patience, (dental assistant)  ANESTHESIA: General anesthesia via nasoendotracheal tube.  MEDICATIONS: 1. Ancef 2 g IV prior to invasive dental procedures. 2. Local anesthesia with a total utilization of 6 carpules each containing 34 mg of lidocaine with 0.017 mg of epinephrine as well as 2 carpules each containing 9 mg of bupivacaine with 0.009 mg of epinephrine.  SPECIMENS: There are 20 teeth that were discarded.  DRAINS: None  CULTURES: None  COMPLICATIONS: None   ESTIMATED BLOOD LOSS: 150 mLs.  INTRAVENOUS FLUIDS: 600 mLs of Lactated ringers solution.  INDICATIONS: The patient was previously diagnosed with ischemic cardiomyopathy.  A medically necessary dental consultation was then requested to evaluate poor dentition prior to anticipated LVAD surgery.  The patient was examined and treatment planned for extraction of remaining teeth with alveoloplasty and pre-prosthetic surgery as needed in the operating room with general anesthesia.  This treatment plan was formulated to decrease the risks and complications associated with  dental infection from affecting the patient's systemic health and anticipated LVAD surgery.  OPERATIVE FINDINGS: Patient was examined operating room number 10.  The teeth were identified for extraction. The patient was noted be affected by chronic apical periodontitis, dental caries, retained root segments, chronic periodontitis, loose teeth, and the presence of bilateral mandibular lingual tori.  DESCRIPTION OF PROCEDURE: Patient was brought to the main operating room number 10. Patient was then placed in the supine position on the operating table. General  anesthesia was then induced per the anesthesia team. The patient was then prepped and draped in the usual manner for dental medicine procedure. A timeout was performed. The patient was identified and procedures were verified. A throat pack was placed at this time. The oral cavity was then thoroughly examined with the findings noted above. The patient was then ready for dental medicine procedure as follows:  Local anesthesia was then administered sequentially with a total utilization of 6 carpules each containing 34 mg of lidocaine with 0.017 mg of epinephrine as well as 2 carpules  each containing 9 mg bupivacaine with 0.009 mg of epinephrine.  The Maxillary left and right quadrants first approached. Anesthesia was then delivered utilizing infiltration with lidocaine with epinephrine. A #15 blade incision was then made from the maxillary right tuberosity and extended to the maxillary left tuberosity.  A  surgical flap was then carefully reflected. Appropriate amounts of buccal and interseptal bone were then removed utilizing a surgical handpiece and bur and copious amounts of sterile water around tooth numbers 4, 5, 6, 13, 14, and 15..  The teeth were then subluxated with a series of straight elevators. Tooth numbers 4-15  were then removed  with a 150 forceps without complications. Alveoloplasty was then performed utilizing a ronguers and bone file to  help achieve primary closure. The surgical site was then irrigated with copious amounts of sterile saline. The tissues were approximated and trimmed appropriately.  A piece of Surgicel was placed in each extraction socket appropriately. The  maxillary right surgical site was then closed from the maxillary right tuberosity and extended the mesial #8 utilizing 3-0 chromic gut suture in a continuous sharp and suture technique 1. The maxillary left surgical site was then closed from the maxillary left tuberosity and extended to the mesial of #9 utilizing 3-0 chromic gut suture in a continuous interrupted suture technique 1. 3 interrupted sutures are then placed to further closed surgical site utilizing 3-0 chromic gut material. 2 additional interrupted sutures then placed to further close the soft tissues with 4-0 chromic gut material.  At this point time, the mandibular quadrants were approached. The patient was given bilateral inferior alveolar nerve blocks and long buccal nerve blocks utilizing the bupivacaine with epinephrine. Further infiltration was then achieved utilizing the lidocaine with epinephrine. A 15 blade incision was then made from the distal of number 17 and extended to the distal of #31.  A surgical flap was then carefully reflected. Appropriate amounts of buccal and interseptal bone were then removed utilizing a surgical handpiece and copious amount of sterile water around tooth numbers 20, 21, 22, 27, 28.  The lower teeth were then subluxated with a series of straight elevators. Tooth numbers 19,20,21,22,23,27,28, and 29 were then removed utilizing a 151 forceps. Alveoloplasty was then performed utilizing a rongeurs and bone file.  The lingual flaps were then further reflected to expose the bilateral mandibular lingual tori. These mandibular tori were then reduced utilizing a surgical handpiece and bur and copious amounts sterile water. Alveoloplasty was then again performed utilizing a rongeurs  and bone file to help achieve primary closure.  The tissues were approximated and trimmed appropriately. The surgical sites were then irrigated with copious amounts of sterile saline 4. A piece of Surgicel was then placed in each extraction socket appropriately.  The mandibular left surgical site was then closed from the distal of #17 and extended the mesial #24 utilizing 3-0 chromic gut suture in a continuous interrupted suture technique 1. The mandibular right surgical site was then closed from the distal of #31 and extended the mesial #25 utilizing 3-0 chromic gut suture in a continuous interrupted suture technique 1. 2 individual interrupted sutures are then placed to further closed surgical site with 3-0 chromic gut material.  At this point time, the entire mouth was irrigated with copious amounts of sterile saline. The patient was examined for complications, seeing none, the dental medicine procedure was deemed to be complete. The throat pack was removed at this time.  A series of 4 x 4 gauze moistened with Amicar 5% rinse  were placed in the mouth to aid hemostasis. The patient was then handed over to the anesthesia team for final disposition. After an appropriate amount of time, the patient was extubated and taken to the postanesthsia care unit in good condition. All counts were correct for the dental medicine procedure. The patient is to continue the Amicar 5% rinses postoperatively. Patient is to rinse with 10 ML's every hour for the next 10 hours and then as needed for persistent oozing in a swish and spit manner.  Patient is to be evaluated for start of heparin therapy at 2200 this evening. If no significant  oral oozing, the patient is to be restarted on heparin therapy with no bolus by the pharmacy team. Patient with anticipated LVAD surgery on Monday morning at the discretion of Dr. Tharon Aquas Trigt.  Lenn Cal, DDS.

## 2017-06-08 NOTE — Assessment & Plan Note (Signed)
-   Patient with brittle diabetes. Based on response to 10 U at ED visit, may benefit from a moderate sliding scale. Suggested this but patient to be admitted over the next couple of weeks, so will be able to monitor his response to sliding scale while there and titrate as needed. Based on his current needs his insulin sensitivity index is roughly 60 per unit (1800 divided by 30), but this does not seem consistent with how high his sugars are running.  - Will check on his CBGs while hospitalized - May benefit from Endocrinology consult and consideration of insulin pump - To follow-up when out of the hospital with Dr. Valentina Lucks to continue process of getting continuous insulin monitor

## 2017-06-08 NOTE — Progress Notes (Signed)
ANTICOAGULATION CONSULT NOTE - [facility] for heparin Indication: DVT  Allergies  Allergen Reactions  . Metformin And Related Diarrhea    Patient Measurements: Height: 5' 8"  (172.7 cm) Weight: 192 lb 14.4 oz (87.5 kg) IBW/kg (Calculated) : 68.4 Heparin Dosing Weight: 85 kg  Vital Signs: Temp: 98.2 F (36.8 C) (10/04 2200) Temp Source: Core (Comment) (10/04 2000) BP: 129/79 (10/04 2200) Pulse Rate: 85 (10/04 2200)  Labs:  Recent Labs  06/07/17 0652 06/07/17 0736 06/07/17 2150 06/08/17 0419  HGB  --   --   --  9.9*  HCT  --   --   --  30.9*  PLT  --   --   --  207  LABPROT  --  12.9  --   --   INR  --  0.98  --   --   HEPARINUNFRC  --   --  0.36  --   CREATININE 2.20* 2.09*  --  1.55*    Estimated Creatinine Clearance: 59.9 mL/min (A) (by C-G formula based on SCr of 1.55 mg/dL (H)).   Medical History: Past Medical History:  Diagnosis Date  . Angina   . ASCVD (arteriosclerotic cardiovascular disease)    , Anterior infarction 2005, LAD diagonal bifurcation intervention 03/2004  . Automatic implantable cardiac defibrillator -[employer]       . Benign neoplasm of colon   . CHF (congestive heart failure) ([place])   . Chronic systolic heart failure ([place])   . Coronary artery disease     Widely patent previously placed stents in the left anterior   . Crohn's disease ([place])   . 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 Dyspnea   . Gastroparesis   . GERD (gastroesophageal reflux disease)   . High cholesterol   . Hyperlipidemia   . Hypersomnolent    Previous diagnosis of narcolepsy  . Hypertension, essential   . Ischemic cardiomyopathy    Ejection fraction 15-20% catheterization 2010  . Type II or unspecified type diabetes mellitus without mention of complication, not stated as uncontrolled   . Unspecified gastritis and gastroduodenitis without mention of hemorrhage     Assessment: 54 yoM admitted for LVAD workup. Pt on  warfarin PTA for recurrent DVTs now to transition to heparin per pharmacy. Pt now s/p dental extractions and per DDS okay to resume heparin drip with no bolus tonight at 2200 if oral bleeding is well-controlled. Heparin previously therapeutic on 1200 units/hr.  Confirmed with nursing that no significant bleeding noticed.   Goal of Therapy:  Heparin Level 0.3-0.7 Monitor platelets by anticoagulation protocol: Yes   Plan:  -Start heparin with no bolus at 1200 units/hr -6-hr heparin level -Monitor heparin level, CBC, S/Sx bleeding daily  Doylene Canard, PharmD Clinical Pharmacist  Phone: [phone] 06/08/2017

## 2017-06-08 NOTE — Progress Notes (Signed)
Initial Nutrition Assessment  INTERVENTION:   Encourage po intake at meals.  Will supplement diet as appropriate.   NUTRITION DIAGNOSIS:   Increased nutrient needs related to  (planned LVAD placement) as evidenced by estimated needs.  GOAL:   Patient will meet greater than or equal to 90% of their needs  MONITOR:   Supplement acceptance, Diet advancement, PO intake, Labs  REASON FOR ASSESSMENT:   Consult Assessment of nutrition requirement/status (LVAD, pt edentulous)  ASSESSMENT:   Pt with PMH of CAD s/p MI, CHF, CKD, chronic N/V/D from gastroparesis, DM, admitted 10/3 for optimization prior to LVAD. Multiple teeth extractions 10/4.    Plan for LVAD 10/8 Pt c/o mouth pain. He writes on a board to communicate.  He reports no recent weight changes and good appetite PTA.  He writes that he was trying to follow DM diet at home but does not give specifics at this time.   Pt not appropriate for Boost Breeze but would be willing to have Glucerna Shake if needed after surgery.  Per RN pt has taken a few sips of clear liquids.   Medications reviewed and include: SSI, novolog 7 units with supper, lantus 21 units daily, reglan TID before meals, mag-ox, protonix  Labs reviewed: K+ 3.2 (L) Lab Results  Component Value Date   HGBA1C 10.1 (H) 06/07/2017   Nutrition-Focused physical exam completed. Findings are no fat depletion, no muscle depletion, and no edema.    Diet Order:  Diet clear liquid Room service appropriate? No; Fluid consistency: Thin  Skin:  Reviewed, no issues  Last BM:  10/2  Height:   Ht Readings from Last 1 Encounters:  06/07/17 5' 8"  (1.727 m)    Weight:   Wt Readings from Last 1 Encounters:  06/08/17 192 lb 14.4 oz (87.5 kg)    Ideal Body Weight:  70 kg  BMI:  Body mass index is 29.33 kg/m.  Estimated Nutritional Needs:   Kcal:  2100-2300  Protein:  110-125 grams  Fluid:  > 1.5 L/day  EDUCATION NEEDS:   Education needs  addressed  Maylon Peppers RD, [place], [place] Pager [phone] After Hours Pager

## 2017-06-08 NOTE — Anesthesia Postprocedure Evaluation (Signed)
Anesthesia Post Note  Patient: [name]  Procedure(s) Performed: MULTIPLE EXTRACTION WITH ALVEOLOPLASTY AND PRE PROSTHETIC SURGERY AS NEEDED (N/A )     Patient location during evaluation: PACU Anesthesia Type: General Level of consciousness: awake and alert Pain management: pain level controlled Vital Signs Assessment: post-procedure vital signs reviewed and stable Respiratory status: spontaneous breathing, nonlabored ventilation, respiratory function stable and patient connected to nasal cannula oxygen Cardiovascular status: blood pressure returned to baseline (Remains on pre-existing milrinone infusion) Postop Assessment: no apparent nausea or vomiting Anesthetic complications: no    Last Vitals:  Vitals:   06/08/17 1107 06/08/17 1115  BP: (!) 156/77   Pulse: 85 85  Resp: 14 12  Temp:  36.6 C  SpO2: 99% 98%    Last Pain:  Vitals:   06/08/17 1046  TempSrc:   PainSc: 10-Worst pain ever                 Audry Pili

## 2017-06-08 NOTE — Progress Notes (Signed)
Chaplain completed AD.

## 2017-06-08 NOTE — Progress Notes (Signed)
Advanced Heart Failure Rounding Note  PCP:  Primary Cardiologist:   Subjective:   Admitted for optimization prior to VAD and teeth extractions.   Earlier today he had multiple tooth extractions. Now complaining of severe pain and BP high. Remains on milrinone 0.125 mcg, creatinine down to 1.5.   Swan Numbers post-op CVP 4  PAP 28/23 CO 5.6 CI 2.8   RHC 06/07/2017  RA 3 RV 49/5 PA 48/14, mean 27 PCWP mean 14 Oxygen saturations: PA 63% AO 95% Cardiac Output (Thermo) 4.0 Cardiac Index (Thermo) 2.0  PVR 3.25 WU   Objective:   Weight Range: 192 lb 14.4 oz (87.5 kg) Body mass index is 29.33 kg/m.   Vital Signs:   Temp:  [97.9 F (36.6 C)-98.8 F (37.1 C)] 97.9 F (36.6 C) (10/04 1115) Pulse Rate:  [69-90] 85 (10/04 1115) Resp:  [8-22] 12 (10/04 1115) BP: (81-157)/(49-92) 156/77 (10/04 1107) SpO2:  [93 %-99 %] 98 % (10/04 1115) Weight:  [191 lb 9.3 oz (86.9 kg)-192 lb 14.4 oz (87.5 kg)] 192 lb 14.4 oz (87.5 kg) (10/04 0500) Last BM Date: 06/06/17  Weight change: Filed Weights   06/07/17 0631 06/07/17 1725 06/08/17 0500  Weight: 189 lb (85.7 kg) 191 lb 9.3 oz (86.9 kg) 192 lb 14.4 oz (87.5 kg)    Intake/Output:   Intake/Output Summary (Last 24 hours) at 06/08/17 1325 Last data filed at 06/08/17 1100  Gross per 24 hour  Intake          1298.65 ml  Output             2600 ml  Net         -1301.35 ml      Physical Exam   CVP 4 General: Shaking in the bed due to pain. Face edematous with ice in place HEENT: Face with ice packs and blood soaked gauze in mouth Neck: Supple. JVP hard to assess  . Carotids 2+ bilat; no bruits. No lymphadenopathy or thyromegaly appreciated. RIJ swan  Cor: PMI nondisplaced. Regular rate & rhythm. No rubs, gallops or murmurs. Lungs: Clear Abdomen: Soft, nontender, nondistended. No hepatosplenomegaly. No bruits or masses. Good bowel sounds. Extremities: No cyanosis, clubbing, rash, edema Neuro: Alert & orientedx3, cranial  nerves grossly intact. moves all 4 extremities w/o difficulty. Affect pleasant   Telemetry   NSR personally reviewed.   EKG    N/A  Labs    CBC  Recent Labs  06/08/17 0419  WBC 7.4  NEUTROABS 5.1  HGB 9.9*  HCT 30.9*  MCV 80.9  PLT 856   Basic Metabolic Panel  Recent Labs  06/07/17 0736 06/08/17 0419  NA 138 142  K 3.8 3.2*  CL 100* 105  CO2 29 29  GLUCOSE 229* 84  BUN 54* 37*  CREATININE 2.09* 1.55*  CALCIUM 8.8* 8.4*   Liver Function Tests  Recent Labs  06/08/17 0419  AST 61*  ALT 43  ALKPHOS 113  BILITOT 0.7  PROT 6.4*  ALBUMIN 2.8*   No results for input(s): LIPASE, AMYLASE in the last 72 hours. Cardiac Enzymes No results for input(s): CKTOTAL, CKMB, CKMBINDEX, TROPONINI in the last 72 hours.  BNP: BNP (last 3 results)  Recent Labs  02/08/17 1250 04/05/17 1510 06/07/17 2234  BNP 1,330.6* 2,214.8* 324.2*    ProBNP (last 3 results) No results for input(s): PROBNP in the last 8760 hours.   D-Dimer No results for input(s): DDIMER in the last 72 hours. Hemoglobin A1C  Recent Labs  06/07/17 2234  HGBA1C 10.1*   Fasting Lipid Panel No results for input(s): CHOL, HDL, LDLCALC, TRIG, CHOLHDL, LDLDIRECT in the last 72 hours. Thyroid Function Tests No results for input(s): TSH, T4TOTAL, T3FREE, THYROIDAB in the last 72 hours.  Invalid input(s): FREET3  Other results:   Imaging    Dg Chest Port 1 View  Result Date: 06/07/2017 CLINICAL DATA:  CHF EXAM: PORTABLE CHEST 1 VIEW COMPARISON:  05/29/2017 FINDINGS: Rotated patient. Left-sided pacing device as before. Placement of right IJ Swan-Ganz catheter with tip projecting over right lower lobe pulmonary artery. Elevated right diaphragm as before. Cardiomegaly without edema. No pneumothorax. IMPRESSION: 1. Insertion of right sided Swan-Ganz catheter with tip projecting over right lower lobe pulmonary artery 2. Negative for pneumothorax 3. Stable cardiomegaly. Electronically Signed    By: Donavan Foil M.D.   On: 06/07/2017 19:55      Medications:     Scheduled Medications: . aminocaproic acid  10 mL Oral Q1H  . atorvastatin  80 mg Oral q1800  . carvedilol  3.125 mg Oral BID  . digoxin  0.125 mg Oral Daily  . fentaNYL      . gabapentin  300 mg Oral BID  . insulin aspart  0-15 Units Subcutaneous TID WC  . insulin aspart  0-5 Units Subcutaneous QHS  . insulin aspart  7 Units Subcutaneous Q supper  . insulin glargine  21 Units Subcutaneous Daily  . isosorbide-hydrALAZINE  1 tablet Oral TID  . magnesium oxide  400 mg Oral Daily  . metoCLOPramide  5 mg Oral TID AC  . pantoprazole  40 mg Oral Daily  . potassium chloride  40 mEq Oral Once  . sodium chloride flush  3 mL Intravenous Q12H  . sodium chloride flush  3 mL Intravenous Q12H  . spironolactone  12.5 mg Oral Daily     Infusions: . sodium chloride 10 mL/hr at 06/08/17 0645  . sodium chloride 10 mL/hr at 06/07/17 1715  . sodium chloride    . lactated ringers    . milrinone 0.125 mcg/kg/min (06/08/17 0645)     PRN Medications:  sodium chloride, sodium chloride, acetaminophen, fentaNYL (SUBLIMAZE) injection, morphine injection, ondansetron (ZOFRAN) IV, ondansetron, oxyCODONE-acetaminophen, sodium chloride flush, sodium chloride flush    Patient Profile   51 yo with history of CAD s/p anterior MI in 2010 and chronic systolic CHF (ischemic cardiomyopathy) as well as CKD stage III is admittted today for hemodynamic optimization prior to LVAD placement.   Assessment/Plan   1. Chronic systolic CHF: Ischemic cardiomyopathy. [employer] ICD. NSTEMI in March 2018 with DES to LAD and RCA, complicated by cardiogenic shock, low output requiring milrinone. Most recent echo 8/18 with EF 20-25%, moderate MR. NYHA class IIIb-IVsymptoms (fluctuate). I think he is nearing the need for LVAD placement. CPX in 8/18 showed a severe functional impairment from HF and creatinine is worsening, likely cardiorenal. NYHA  class IIIb-IV, fluctuates. He has seen Dr. Prescott Gum. He still needs dental extractions.  He had RHC today showing optimized filling pressure and marginal cardiac index (2.0 by thermodilution).  [name] was left in place.  Creatinine trending down from 2.1>1.5.  - Continue Bidil1 tabs tid today. Not sure how well he can swallow after tooth extractions. Add 10 mg IV hydralazine for SBP > 150.   - Continue low dose Coreg.  - Continue digoxin, level ok today.    - Can continue low dose spironolactone.  - Not on ACEI/ARB/ARNI with CKD.  - Narrow QRS, not  CRT candidate.  - He is off Plavix for washout prior to LVAD (now > 6 month post-PCI).  He has been on Lovenox bridge off warfarin.  Needs teeth extractions tomorrow with Dr. Enrique Sack.  Tentative plan for LVAD (HM III) Monday, will need evaluation by Dr. Prescott Gum.  2. CKD stage III: Suspect a cardiorenal component given worsening over the last few months though diabetic nephropathy likely plays a role. On admit Creatinine 2.09 and now down to 1.55.    - Continue low dose milrinone.  3. CAD: NSTEMI in 3/18. LHC with 99% ulcerated lesion proximal RCA with left to right collaterals, 95% mid LAD stenosis after mid LAD stent. s/p PCI to RCA and LAD on 11/25/16.  - Off plavix with planned LVAD next week.  - Continue Coreg as above.  - Continue high intensity statin, good lipids recently.  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 Recent V/Q scan did not show acute or chronic PE.  He is a heterozygote for Factor V Leiden, plan for for ASA 325 and warfarin INR 2-2.5 when LVAD placed.  Will cover with heparin gtt pre-op => restart heparin gtt this evening.  5. Chronic N/V/D with gastroparesis: Follows with GI. Quiescent currently.  6. DM: On Jardiance at home, will cover with sliding scale and ask diabetes coordinator to see.  7. S/P Multiple Tooth Extractions 4-15, 19-23, and 27-29. Continue oral care per Dr Enrique Sack. Not sure how well he can  swallow.   Length of Stay: 1  Amy Clegg, NP  06/08/2017, 1:25 PM  Advanced Heart Failure Team Pager [phone] (M-F; 7a - 4p)  Please contact [facility] for night-coverage after hours (4p -7a ) and weekends on [email or web address]  Patient seen with NP, agree with the above note.  He is seen s/p teeth extractions, significant pain.  Swan numbers look good, creatinine is lower on milrinone 0.125 (down to 1.5).  With CVP 4, will hold diuretics today.  Will need IV hydralazine if he cannot take po.  CI 2.8, up from 2.0 yesterday.   Will need to resume heparin gtt later today.   Plan for LVAD placement on Monday.   Octavion Mollenkopf 06/08/2017 1:56 PM

## 2017-06-09 ENCOUNTER — Encounter (HOSPITAL_COMMUNITY): Payer: BLUE CROSS/BLUE SHIELD

## 2017-06-09 ENCOUNTER — Encounter (HOSPITAL_COMMUNITY): Payer: Self-pay | Admitting: Dentistry

## 2017-06-09 ENCOUNTER — Inpatient Hospital Stay (HOSPITAL_COMMUNITY): Payer: BLUE CROSS/BLUE SHIELD

## 2017-06-09 DIAGNOSIS — I5043 Acute on chronic combined systolic (congestive) and diastolic (congestive) heart failure: Secondary | ICD-10-CM

## 2017-06-09 DIAGNOSIS — K0889 Other specified disorders of teeth and supporting structures: Secondary | ICD-10-CM

## 2017-06-09 DIAGNOSIS — K029 Dental caries, unspecified: Secondary | ICD-10-CM

## 2017-06-09 DIAGNOSIS — M270 Developmental disorders of jaws: Secondary | ICD-10-CM

## 2017-06-09 DIAGNOSIS — K0530 Chronic periodontitis, unspecified: Secondary | ICD-10-CM

## 2017-06-09 DIAGNOSIS — K08109 Complete loss of teeth, unspecified cause, unspecified class: Secondary | ICD-10-CM

## 2017-06-09 DIAGNOSIS — Z515 Encounter for palliative care: Secondary | ICD-10-CM

## 2017-06-09 DIAGNOSIS — Z7189 Other specified counseling: Secondary | ICD-10-CM

## 2017-06-09 DIAGNOSIS — K08199 Complete loss of teeth due to other specified cause, unspecified class: Secondary | ICD-10-CM

## 2017-06-09 DIAGNOSIS — K083 Retained dental root: Secondary | ICD-10-CM

## 2017-06-09 DIAGNOSIS — K0820 Unspecified atrophy of edentulous alveolar ridge: Secondary | ICD-10-CM

## 2017-06-09 DIAGNOSIS — I255 Ischemic cardiomyopathy: Secondary | ICD-10-CM

## 2017-06-09 DIAGNOSIS — K045 Chronic apical periodontitis: Secondary | ICD-10-CM

## 2017-06-09 LAB — CBC WITH DIFFERENTIAL/PLATELET
BASOS ABS: 0 10*3/uL (ref 0.0–0.1)
BASOS PCT: 0 %
Eosinophils Absolute: 0 10*3/uL (ref 0.0–0.7)
Eosinophils Relative: 0 %
HEMATOCRIT: 28.2 % — AB (ref 39.0–52.0)
HEMOGLOBIN: 9 g/dL — AB (ref 13.0–17.0)
Lymphocytes Relative: 9 %
Lymphs Abs: 1.1 10*3/uL (ref 0.7–4.0)
MCH: 26.2 pg (ref 26.0–34.0)
MCHC: 31.9 g/dL (ref 30.0–36.0)
MCV: 82 fL (ref 78.0–100.0)
Monocytes Absolute: 0.9 10*3/uL (ref 0.1–1.0)
Monocytes Relative: 8 %
NEUTROS ABS: 9.6 10*3/uL — AB (ref 1.7–7.7)
NEUTROS PCT: 83 %
Platelets: 157 10*3/uL (ref 150–400)
RBC: 3.44 MIL/uL — AB (ref 4.22–5.81)
RDW: 15.7 % — ABNORMAL HIGH (ref 11.5–15.5)
WBC: 11.6 10*3/uL — ABNORMAL HIGH (ref 4.0–10.5)

## 2017-06-09 LAB — COMPREHENSIVE METABOLIC PANEL
ALK PHOS: 101 U/L (ref 38–126)
ALT: 65 U/L — ABNORMAL HIGH (ref 17–63)
ANION GAP: 8 (ref 5–15)
AST: 83 U/L — ABNORMAL HIGH (ref 15–41)
Albumin: 2.7 g/dL — ABNORMAL LOW (ref 3.5–5.0)
BILIRUBIN TOTAL: 0.5 mg/dL (ref 0.3–1.2)
BUN: 35 mg/dL — ABNORMAL HIGH (ref 6–20)
CALCIUM: 8.5 mg/dL — AB (ref 8.9–10.3)
CO2: 26 mmol/L (ref 22–32)
Chloride: 103 mmol/L (ref 101–111)
Creatinine, Ser: 1.65 mg/dL — ABNORMAL HIGH (ref 0.61–1.24)
GFR calc non Af Amer: 46 mL/min — ABNORMAL LOW (ref >60)
GFR, EST AFRICAN AMERICAN: 54 mL/min — AB (ref >60)
Glucose, Bld: 167 mg/dL — ABNORMAL HIGH (ref 65–99)
Potassium: 4.4 mmol/L (ref 3.5–5.1)
SODIUM: 137 mmol/L (ref 135–145)
TOTAL PROTEIN: 6.3 g/dL — AB (ref 6.5–8.1)

## 2017-06-09 LAB — GLUCOSE, CAPILLARY
GLUCOSE-CAPILLARY: 110 mg/dL — AB (ref 65–99)
Glucose-Capillary: 155 mg/dL — ABNORMAL HIGH (ref 65–99)

## 2017-06-09 LAB — HEPARIN LEVEL (UNFRACTIONATED)
HEPARIN UNFRACTIONATED: 0.25 [IU]/mL — AB (ref 0.30–0.70)
HEPARIN UNFRACTIONATED: 0.58 [IU]/mL (ref 0.30–0.70)
Heparin Unfractionated: 1.08 IU/mL — ABNORMAL HIGH (ref 0.30–0.70)
Heparin Unfractionated: 0.47 IU/mL (ref 0.30–0.70)

## 2017-06-09 LAB — MAGNESIUM: Magnesium: 2.1 mg/dL (ref 1.7–2.4)

## 2017-06-09 LAB — COOXEMETRY PANEL
CARBOXYHEMOGLOBIN: 1.3 % (ref 0.5–1.5)
Methemoglobin: 1.4 % (ref 0.0–1.5)
O2 SAT: 82.5 %
Total hemoglobin: 10.3 g/dL — ABNORMAL LOW (ref 12.0–16.0)

## 2017-06-09 LAB — LACTATE DEHYDROGENASE: LDH: 255 U/L — ABNORMAL HIGH (ref 98–192)

## 2017-06-09 LAB — PLATELET INHIBITION P2Y12: Platelet Function  P2Y12: 352 [PRU] (ref 194–418)

## 2017-06-09 LAB — SURGICAL PCR SCREEN
MRSA, PCR: NEGATIVE
Staphylococcus aureus: NEGATIVE

## 2017-06-09 NOTE — Progress Notes (Signed)
[name] has been discussed with the VAD Medical Review board on August 27th 2018. The team feels as if the patient is a good candidate for Destination LVAD therapy not a transplant candidate at this time due to renal insufficiency. The patient meets criteria for a LVAD implant as listed below:  1)  Has failed to respond to optimal medical management (including beta-blockers and ACE inhibitors if tolerated) for at least 45 of the last 60 days, or have been balloon dependent for 7 days, or IV inotrope dependent for 14 days; and,       Failed oral heart failure therapy has been on oral therapy since early March.  2)  Has a left ventricular ejection fraction (LVEF) < 25% and, have demonstrated functional limitation with a peak oxygen consumption of <14 ml/kg/min unless balloon pump or inotrope dependent or physically unable to perform the test.         *EF 20-25% By echo (date)  04/22/17            *CPX (date)  pVO2 _12.4_ RER_1.05__ VE/VCo2 slope_35__   3)  Social work and palliative care evaluations demonstrate appropriate support system in place for discharge to home with a VAD and that end of life discussions have taken place. Both services have expressed no concern regarding patient's candidacy.         *Social work consult (date): 05/04/17        *Palliative Care Consult (date): 06/09/17  4)  Primary caretaker identified that can be taught along with the patient how to manage        the VAD equipment.        *Name: [name]  5)  Deemed appropriate by our financial coordinator: 05/01/17        Prior approval: 05/29/17 [id number]  1)  VAD Coordinator, Judson Roch has met with patient and caregiver, shown them the VAD equipment and discussed with the patient and caregiver about lifestyle changes necessary for success on mechanical circulatory device.        *Met with pt/caregiver on 04/25/17.       *Consent for VAD Evaluation/Caregiver Agreement/HIPPA Release/Photo Release signed on  04/25/17.  7)  Patient has been discussed with [facility] (Dr.Devore) and felt to be an appropriate candidate.    8)  Intermacs profile: 3  INTERMACS 1: Critical cardiogenic shock describes a patient who is "crashing and burning", in which a patient has life-threatening hypotension and rapidly escalating inotropic pressor support, with critical organ hypoperfusion often confirmed by worsening acidosis and lactate levels.  INTERMACS 2: Progressive decline describes a patient who has been demonstrated "dependent" on inotropic support but nonetheless shows signs of continuing deterioration in nutrition, renal function, fluid retention, or other major status indicator. Patient profile 2 can also describe a patient with refractory volume overload, perhaps with evidence of impaired perfusion, in whom inotropic infusions cannot be maintained due to tachyarrhythmias, clinical ischemia, or other intolerance.  INTERMACS 3: Stable but inotrope dependent describes a patient who is clinically stable on mild-moderate doses of intravenous inotropes (or has a temporary circulatory support device) after repeated documentation of failure to wean without symptomatic hypotension, worsening symptoms, or progressive organ dysfunction (usually renal). It is critical to monitor nutrition, renal function, fluid balance, and overall status carefully in order to distinguish between a  patient who is truly stable at Patient Profile 3 and a patient who has unappreciated decline rendering them Patient Profile 2. This patient may be either at home or in the  hospital.   INTERMACS 4: Resting symptoms describes a patient who is at home on oral therapy but frequently has symptoms of congestion at rest or with activities of daily living (ADL). He or she may have orthopnea, shortness of breath during ADL such as dressing or bathing, gastrointestinal symptoms (abdominal discomfort, nausea, poor appetite), disabling ascites or severe lower  extremity edema. This patient should be carefully considered for more intensive management and surveillance programs, which may in some cases, reveal poor compliance that would compromise outcomes with any therapy.  .  INTERMACS 5: Exertion Intolerant describes a patient who is comfortable at rest but unable to engage in any activity, living predominantly within the house or housebound. This patient has no congestive symptoms, but may have chronically elevated volume status, frequently with renal dysfunction, and may be characterized as exercise intolerant.   INTERMACS 6: Exertion Limited also describes a patient who is comfortable at rest without evidence of fluid overload, but who is able to do some mild activity. Activities of daily living are comfortable and minor activities outside the home such as visiting friends or going to a restaurant can be performed, but fatigue results within a few minutes of any meaningful physical exertion. This patient has occasional episodes of worsening symptoms and is likely to have had a hospitalization for heart failure within the past year.   INTERMACS 7: Advanced NYHA Class 3 describes a patient who is clinically stable with a reasonable level of comfortable activity, despite history of previous decompensation that is not recent. This patient is usually able to walk more than a block. Any decompensation requiring intravenous diuretics or hospitalization within the previous month should make this person a Patient Profile 6 or lower.    9)  NYHA Class: Class IV  Pt is scheduled for VAD implant of Heartmate 3 on Monday 06/12/17.  [name] RN,BSN VAD Coordinator Pager [phone] (7am - 7am)

## 2017-06-09 NOTE — Progress Notes (Signed)
Called to assess LUA midline. No blood return, unable to flush, Line retracted 1 cm with NBR, unable to flush, retracted 2 cm with NBR, unable to flush. Midline discontinued. 2H staff notified. Patient with history of difficult starts, including this admission leading to midline placement. He may require central access.

## 2017-06-09 NOTE — Progress Notes (Signed)
POST OPERATIVE NOTE:  06/09/2017 Avery E Fleek [id number]  VITALS: BP 125/81   Pulse 85   Temp 97.7 F (36.5 C)   Resp 13   Ht 5' 8"  (1.727 m)   Wt 199 lb 4.7 oz (90.4 kg)   SpO2 100%   BMI 30.30 kg/m   LABS:  Lab Results  Component Value Date   WBC 11.6 (H) 06/09/2017   HGB 9.0 (L) 06/09/2017   HCT 28.2 (L) 06/09/2017   MCV 82.0 06/09/2017   PLT 157 06/09/2017   BMET    Component Value Date/Time   NA 137 06/09/2017 0516   NA 144 05/11/2017 0938   K 4.4 06/09/2017 0516   CL 103 06/09/2017 0516   CO2 26 06/09/2017 0516   GLUCOSE 167 (H) 06/09/2017 0516   BUN 35 (H) 06/09/2017 0516   BUN 68 (H) 05/11/2017 0938   CREATININE 1.65 (H) 06/09/2017 0516   CREATININE 1.63 (H) 11/09/2016 1505   CALCIUM 8.5 (L) 06/09/2017 0516   GFRNONAA 46 (L) 06/09/2017 0516   GFRNONAA 48 (L) 11/09/2016 1505   GFRAA 54 (L) 06/09/2017 0516   GFRAA 55 (L) 11/09/2016 1505    Lab Results  Component Value Date   INR 0.98 06/07/2017   INR 1.58 05/04/2017   INR 1.7 04/26/2017   No results found for: PTT   Jesiel E Lamorte is status post extraction of remaining teeth with alveoloplasty and pre-prosthetic surgery as needed in the operating room with general anesthesia on 06/08/2017.   SUBJECTIVE: Patient's mouth is sore by report. Patient denies any active bleeding from his mouth.   EXAM: There is no sign of oral infection, heme, or ooze.  Sutures are intact and clots are present. Bruising and swelling is noted intraorally. The patient is now edentulous. There is atrophy of the edentulous alveolar ridges.  ASSESSMENT: Post operative course is consistent with dental procedures performed in the operating room with general anesthesia. The patient is edentulous. There is atrophy of the edentulous alveolar ridges.  PLAN: 1. Use chlorhexidine gluconate rinses twice daily as prescribed in a swish and spit manner. 2. Advance diet as tolerated per nutritional consultation. 3.  Patient is cleared dentally for LVAD procedure on Monday. 4. Patient to contact Dental Medicine for scheduling of his suture removal and evaluation of healing once he is discharged after the anticipated LVAD procedure 5. Patient also is to follow-up with a dentist of his choice for fabrication of upper and lower complete dentures after adequate healing and once medically stable from his anticipated LVAD procedure.   Lenn Cal, DDS

## 2017-06-09 NOTE — Progress Notes (Signed)
Advanced Heart Failure Rounding Note  PCP:  Primary Cardiologist:   Subjective:    Admitted for optimization prior to VAD and teeth extractions.   10/4 S/P multiple tooth extractions.   Feeling better. Tolerating a diet and able to swallow medications.Remains on milrinone 0.125 mcg.   Swan Numbers post-op CVP 4   PAP 20/13 CO 5.57 CI 2.7   Co-ox 82%  RHC 06/07/2017  RA 3 RV 49/5 PA 48/14, mean 27 PCWP mean 14 Oxygen saturations: PA 63% AO 95% Cardiac Output (Thermo) 4.0 Cardiac Index (Thermo) 2.0  PVR 3.25 WU   Objective:   Weight Range: 199 lb 4.7 oz (90.4 kg) Body mass index is 30.3 kg/m.   Vital Signs:   Temp:  [97.7 F (36.5 C)-98.8 F (37.1 C)] 97.7 F (36.5 C) (10/05 0600) Pulse Rate:  [62-93] 85 (10/05 0600) Resp:  [9-17] 13 (10/05 0600) BP: (112-165)/(64-92) 125/81 (10/05 0600) SpO2:  [93 %-100 %] 100 % (10/05 0600) Weight:  [199 lb 4.7 oz (90.4 kg)] 199 lb 4.7 oz (90.4 kg) (10/05 0500) Last BM Date: 06/06/17  Weight change: Filed Weights   06/07/17 1725 06/08/17 0500 06/09/17 0500  Weight: 191 lb 9.3 oz (86.9 kg) 192 lb 14.4 oz (87.5 kg) 199 lb 4.7 oz (90.4 kg)    Intake/Output:   Intake/Output Summary (Last 24 hours) at 06/09/17 0753 Last data filed at 06/09/17 0600  Gross per 24 hour  Intake          1495.88 ml  Output              900 ml  Net           595.88 ml      Physical Exam  CVP 4 General:  Well appearing. No resp difficulty HEENT: normal. RIJ Swan  Neck: supple. no JVD. Carotids 2+ bilat; no bruits. No lymphadenopathy or thryomegaly appreciated. Cor: PMI nondisplaced. Regular rate & rhythm. No rubs, gallops or murmurs. Lungs: clear Abdomen: soft, nontender, nondistended. No hepatosplenomegaly. No bruits or masses. Good bowel sounds. Extremities: no cyanosis, clubbing, rash, R and LLE SCDs. edema Neuro: alert & orientedx3, cranial nerves grossly intact. moves all 4 extremities w/o difficulty. Affect  pleasant    Telemetry   NSR 60-90s personally reviewed .    EKG    N/A  Labs    CBC  Recent Labs  06/08/17 0419 06/09/17 0516  WBC 7.4 11.6*  NEUTROABS 5.1 9.6*  HGB 9.9* 9.0*  HCT 30.9* 28.2*  MCV 80.9 82.0  PLT 207 885   Basic Metabolic Panel  Recent Labs  06/08/17 0419 06/09/17 0516  NA 142 137  K 3.2* 4.4  CL 105 103  CO2 29 26  GLUCOSE 84 167*  BUN 37* 35*  CREATININE 1.55* 1.65*  CALCIUM 8.4* 8.5*   Liver Function Tests  Recent Labs  06/08/17 0419 06/09/17 0516  AST 61* 83*  ALT 43 65*  ALKPHOS 113 101  BILITOT 0.7 0.5  PROT 6.4* 6.3*  ALBUMIN 2.8* 2.7*   No results for input(s): LIPASE, AMYLASE in the last 72 hours. Cardiac Enzymes No results for input(s): CKTOTAL, CKMB, CKMBINDEX, TROPONINI in the last 72 hours.  BNP: BNP (last 3 results)  Recent Labs  02/08/17 1250 04/05/17 1510 06/07/17 2234  BNP 1,330.6* 2,214.8* 324.2*    ProBNP (last 3 results) No results for input(s): PROBNP in the last 8760 hours.   D-Dimer No results for input(s): DDIMER in the last 72 hours. Hemoglobin  A1C  Recent Labs  06/07/17 2234  HGBA1C 10.1*   Fasting Lipid Panel No results for input(s): CHOL, HDL, LDLCALC, TRIG, CHOLHDL, LDLDIRECT in the last 72 hours. Thyroid Function Tests No results for input(s): TSH, T4TOTAL, T3FREE, THYROIDAB in the last 72 hours.  Invalid input(s): FREET3  Other results:   Imaging    Dg Chest Port 1 View  Result Date: 06/08/2017 CLINICAL DATA:  Central line placement.  Dampened PA waveform. EXAM: PORTABLE CHEST 1 VIEW COMPARISON:  06/07/2017. FINDINGS: Cardiomegaly. Unchanged LEFT-sided pacing device. Swan-Ganz catheter tip appears to have been pulled back slightly, now lying in the distal main RIGHT pulmonary artery. Low lung volumes without edema or consolidation. No pneumothorax. IMPRESSION: Support tubes and lines as described. Electronically Signed   By: Staci Righter M.D.   On: 06/08/2017 17:59      Medications:     Scheduled Medications: . atorvastatin  80 mg Oral q1800  . carvedilol  3.125 mg Oral BID  . Chlorhexidine Gluconate Cloth  6 each Topical Daily  . digoxin  0.125 mg Oral Daily  . gabapentin  300 mg Oral BID  . insulin aspart  0-15 Units Subcutaneous TID WC  . insulin aspart  0-5 Units Subcutaneous QHS  . insulin aspart  7 Units Subcutaneous Q supper  . insulin glargine  21 Units Subcutaneous Daily  . isosorbide-hydrALAZINE  1 tablet Oral TID  . magnesium oxide  400 mg Oral Daily  . metoCLOPramide  5 mg Oral TID AC  . pantoprazole  40 mg Oral Daily  . sodium chloride flush  3 mL Intravenous Q12H  . sodium chloride flush  3 mL Intravenous Q12H  . spironolactone  12.5 mg Oral Daily    Infusions: . sodium chloride 10 mL/hr at 06/08/17 0645  . sodium chloride 10 mL/hr at 06/09/17 0600  . sodium chloride    . heparin 1,350 Units/hr (06/09/17 0600)  . lactated ringers    . milrinone 0.125 mcg/kg/min (06/09/17 0600)    PRN Medications: sodium chloride, sodium chloride, acetaminophen, fentaNYL (SUBLIMAZE) injection, hydrALAZINE, ondansetron (ZOFRAN) IV, ondansetron, oxyCODONE-acetaminophen, sodium chloride flush, sodium chloride flush    Patient Profile   51 yo with history of CAD s/p anterior MI in 2010 and chronic systolic CHF (ischemic cardiomyopathy) as well as CKD stage III is admittted today for hemodynamic optimization prior to LVAD placement.   Assessment/Plan   1. Chronic systolic CHF: Ischemic cardiomyopathy. [employer] ICD. NSTEMI in March 2018 with DES to LAD and RCA, complicated by cardiogenic shock, low output requiring milrinone. Most recent echo 8/18 with EF 20-25%, moderate MR. NYHA class IIIb-IVsymptoms (fluctuate). I think he is nearing the need for LVAD placement. CPX in 8/18 showed a severe functional impairment from HF and creatinine is worsening, likely cardiorenal. NYHA class IIIb-IV, fluctuates. He has seen Dr. Prescott Gum. He  still needs dental extractions.  He had RHC today showing optimized filling pressure and marginal cardiac index (2.0 by thermodilution).  [name] was left in place.  Creatinine has trended down from 2.1>1.65.  No change today. Continue HF meds. Volume status stable. CVP 4.  - Hold torsemide today, will reassess filling pressures tomorrow but likely start back on low dose of torsemide.  - Continue Bidil1 tabs tid today.  - Continue low dose Coreg.  - Continue digoxin, level ok today.    - Can continue low dose spironolactone.  - Not on ACEI/ARB/ARNI with CKD.  - Narrow QRS, not CRT candidate.  - He is  off Plavix for washout prior to LVAD (now > 6 month post-PCI).  He has been on Lovenox bridge off warfarin.  Needs teeth extractions tomorrow with Dr. Enrique Sack.  Tentative plan for LVAD (HM III) Monday, will need evaluation by Dr. Prescott Gum.  2. CKD stage III: Suspect a cardiorenal component given worsening over the last few months though diabetic nephropathy likely plays a role. On admit Creatinine 2.09 but now 1.65.     - Continue low dose milrinone.  3. CAD: NSTEMI in 3/18. LHC with 99% ulcerated lesion proximal RCA with left to right collaterals, 95% mid LAD stenosis after mid LAD stent. s/p PCI to RCA and LAD on 11/25/16.  - Off plavix with planned LVAD next week.  - Continue Coreg as above.  - Continue high intensity statin, good lipids recently.  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 Recent V/Q scan did not show acute or chronic PE.  He is a heterozygote for Factor V Leiden, plan for for ASA 325 and warfarin INR 2-2.5 when LVAD placed.  Will cover with heparin gtt pre-op => heparin restarted 2200 on 10/4.   5. Chronic N/V/D with gastroparesis: Follows with GI. Quiescent currently.  6. DM: On Jardiance at home, will cover with sliding scale and ask diabetes coordinator to see.  7. S/P Multiple Tooth Extractions 4-15, 19-23, and 27-29. Continue oral care per Dr Enrique Sack.   Length of  Stay: 2  Darrick Grinder, NP  06/09/2017, 7:53 AM  Advanced Heart Failure Team Pager [phone] (M-F; 7a - 4p)  Please contact [facility] for night-coverage after hours (4p -7a ) and weekends on [email or web address]  Patient seen with NP, agree with the above note.  He is stable this morning post-dental extractions yesterday.  Good cardiac output on milrinone 0.125 and creatinine at 1.65 (2.09 at admission).  CVP is 4 today, continue to hold torsemide.   - Reassess filling pressures tomorrow, may need to restart torsemide at lower dose tomorrow.   - Continue milrinone 0.125 through LVAD surgery.  - Current plan for HM3 on Monday.   - Continue heparin gtt pre-op.  - Awaiting consult from diabetes coordinator.   Despite positional adjustment, cannot draw back on Swan and waveform remains dampened.  Will remove Swan catheter and leave introducer in place, can measure CVP and co-ox off introducer.    Once [name] is out, can walk unit.   Briston Lax 06/09/2017 9:17 AM

## 2017-06-09 NOTE — Progress Notes (Signed)
ANTICOAGULATION CONSULT NOTE - [place] for heparin Indication: DVT  Allergies  Allergen Reactions  . Metformin And Related Diarrhea    Patient Measurements: Height: 5' 8"  (172.7 cm) Weight: 199 lb 4.7 oz (90.4 kg) IBW/kg (Calculated) : 68.4 Heparin Dosing Weight: 85 kg  Vital Signs: Temp: 97.6 F (36.4 C) (10/05 1958) Temp Source: Axillary (10/05 1958) BP: 132/69 (10/05 2100) Pulse Rate: 81 (10/05 2100)  Labs:  Recent Labs  06/07/17 0736  06/08/17 0419 06/09/17 0516  06/09/17 1158 06/09/17 1930 06/09/17 2210  HGB  --   --  9.9* 9.0*  --   --   --   --   HCT  --   --  30.9* 28.2*  --   --   --   --   PLT  --   --  207 157  --   --   --   --   LABPROT 12.9  --   --   --   --   --   --   --   INR 0.98  --   --   --   --   --   --   --   HEPARINUNFRC  --   < >  --   --   < > 0.58 1.08* 0.47  CREATININE 2.09*  --  1.55* 1.65*  --   --   --   --   < > = values in this interval not displayed.  Estimated Creatinine Clearance: 57.2 mL/min (A) (by C-G formula based on SCr of 1.65 mg/dL (H)).  Assessment: 71 yoM admitted for LVAD workup. Pt on warfarin PTA for recurrent DVTs now to transition to heparin per pharmacy. Pt now s/p dental extractions and per DDS okay to resume heparin drip 10/4. Pt to have reduced heparin goal for 24 hours after dental procedure, then will re-evaluate oral bleeding. Heparin level supratherapeutic earlier today. Disregard heparin level of 1.08, due to concerns with draw. Stat repeat came back at 0.47, slightly slightly above goal range. No interruptions per nursing. No signs/symptoms of bleeding.   Goal of Therapy:  Heparin Level 0.3-0.4 Monitor platelets by anticoagulation protocol: Yes   Plan:  -Reduce heparin to 1100 units/hr -Check 6-hr heparin level -Check heparin level, CBC, S/Sx bleeding daily  Doylene Canard, PharmD Clinical Pharmacist  Phone: [phone] 06/09/2017

## 2017-06-09 NOTE — Progress Notes (Signed)
ANTICOAGULATION CONSULT NOTE - [place] for heparin Indication: DVT  Allergies  Allergen Reactions  . Metformin And Related Diarrhea    Patient Measurements: Height: 5' 8"  (172.7 cm) Weight: 199 lb 4.7 oz (90.4 kg) IBW/kg (Calculated) : 68.4 Heparin Dosing Weight: 85 kg  Vital Signs: Temp: 98.1 F (36.7 C) (10/05 0300) Temp Source: Core (Comment) (10/05 0000) BP: 125/76 (10/05 0300) Pulse Rate: 87 (10/05 0300)  Labs:  Recent Labs  06/07/17 6861 06/07/17 0736 06/07/17 2150 06/08/17 0419 06/09/17 0516 06/09/17 0517  HGB  --   --   --  9.9* 9.0*  --   HCT  --   --   --  30.9* 28.2*  --   PLT  --   --   --  207 157  --   LABPROT  --  12.9  --   --   --   --   INR  --  0.98  --   --   --   --   HEPARINUNFRC  --   --  0.36  --   --  0.25*  CREATININE 2.20* 2.09*  --  1.55*  --   --     Estimated Creatinine Clearance: 60.9 mL/min (A) (by C-G formula based on SCr of 1.55 mg/dL (H)).  Assessment: 70 yoM admitted for LVAD workup. Pt on warfarin PTA for recurrent DVTs now to transition to heparin per pharmacy. Pt now s/p dental extractions and per DDS okay to resume heparin drip 10/4  Confirmed with nursing that no significant bleeding noticed.   Hep lvl low at 0.25  Goal of Therapy:  Heparin Level 0.3-0.7 Monitor platelets by anticoagulation protocol: Yes   Plan:  Increase heparin to 1350 units/hr Next lvl 1200 Monitor heparin level, CBC S/Sx bleeding daily  Levester Fresh, PharmD, BCPS, BCCCP Clinical Pharmacist Clinical phone for 06/09/2017 from 7a-3:30p: [phone] If after 3:30p, please call main pharmacy at: [phone] 06/09/2017 5:48 AM

## 2017-06-09 NOTE — Progress Notes (Addendum)
Inpatient Diabetes Program Recommendations  AACE/ADA: New Consensus Statement on Inpatient Glycemic Control (2015)  Target Ranges:  Prepandial:   less than 140 mg/dL      Peak postprandial:   less than 180 mg/dL (1-2 hours)      Critically ill patients:  140 - 180 mg/dL   Lab Results  Component Value Date   GLUCAP 155 (H) 06/09/2017   HGBA1C 10.1 (H) 06/07/2017    Review of Glycemic ControlResults for BREN, STEERS (MRN [id number]) as of 06/09/2017 09:35  Ref. Range 06/08/2017 10:23 06/08/2017 13:12 06/08/2017 15:50 06/08/2017 17:16 06/08/2017 21:50 06/09/2017 08:16  Glucose-Capillary Latest Ref Range: 65 - 99 mg/dL 72 95 94 91 148 (H) 155 (H)   Diabetes history: Type 2 diabetes Outpatient Diabetes medications: Lantus 21 units daily, Novolog 7 units with supper, Jardiance 25 mg daily Current orders for Inpatient glycemic control:  Novolog moderate tid with meals and HS, Lantus 21 units daily, Novolog 7 units with supper  Inpatient Diabetes Program Recommendations:    Please consider reducing Lantus to 16 units daily.  Blood sugars <100 mg/dL yesterday and Lantus not given.  Will talk to patient today.   Thanks, Adah Perl, RN, BC-ADM Inpatient Diabetes Coordinator Pager [phone] (8a-5p)  Addendum 1500: spoke briefly with patient regarding diabetes and the importance of glycemic control.  He states that he thinks that his last A1C was in the 7% range.  I discussed his current A1C of 10.1%.  However also note that his insulin needs are decreased at this time.  He has supplies at home for diabetes.  Will follow regarding glycemic management during hospitalization.

## 2017-06-09 NOTE — Progress Notes (Signed)
ANTICOAGULATION CONSULT NOTE - [place] for heparin Indication: DVT  Allergies  Allergen Reactions  . Metformin And Related Diarrhea    Patient Measurements: Height: 5' 8"  (172.7 cm) Weight: 199 lb 4.7 oz (90.4 kg) IBW/kg (Calculated) : 68.4 Heparin Dosing Weight: 85 kg  Vital Signs: Temp: 97.7 F (36.5 C) (10/05 1152) Temp Source: Oral (10/05 1152) BP: 139/74 (10/05 1000) Pulse Rate: 82 (10/05 1000)  Labs:  Recent Labs  06/07/17 0736 06/07/17 2150 06/08/17 0419 06/09/17 0516 06/09/17 0517 06/09/17 1158  HGB  --   --  9.9* 9.0*  --   --   HCT  --   --  30.9* 28.2*  --   --   PLT  --   --  207 157  --   --   LABPROT 12.9  --   --   --   --   --   INR 0.98  --   --   --   --   --   HEPARINUNFRC  --  0.36  --   --  0.25* 0.58  CREATININE 2.09*  --  1.55* 1.65*  --   --     Estimated Creatinine Clearance: 57.2 mL/min (A) (by C-G formula based on SCr of 1.65 mg/dL (H)).  Assessment: 55 yoM admitted for LVAD workup. Pt on warfarin PTA for recurrent DVTs now to transition to heparin per pharmacy. Pt now s/p dental extractions and per DDS okay to resume heparin drip 10/4. Pt to have reduced heparin goal for 24 hours after dental procedure, then will re-evaluate oral bleeding. Heparin level supratherapeutic today at 0.58, will reduce drip rate and recheck.  Goal of Therapy:  Heparin Level 0.3-0.4 Monitor platelets by anticoagulation protocol: Yes   Plan:  -Reduce heparin to 1200 units/hr -Check 6-hr heparin level -Check heparin level, CBC, S/Sx bleeding daily  Arrie Senate, PharmD PGY-2 Cardiology Pharmacy Resident Pager: [phone] 06/09/2017

## 2017-06-09 NOTE — Consult Note (Signed)
Consultation Note Date: 06/09/2017   Patient Name: [name]  DOB: [birth year]  MRN: [id number]  Age / Sex: 51 y.o., male  PCP: [name], MD Referring Physician: Larey Dresser, MD  Reason for Consultation: LVAD consultation.   HPI/Patient Profile: 51 y.o. male admitted on 06/07/2017  51 yo with history of CAD s/p anterior MI in 2010 and chronic systolic CHF (ischemic cardiomyopathy) as well as CKD stage III is admittted today for hemodynamic optimization prior to LVAD placement.   Clinical Assessment and Goals of Care:  A palliative consultation has been requested as adjunctive medical care prior to destination LVAD placement, advanced directives discussions, preparedness planning, patient engagement, symptom management and advancement of quality of life, goals wishes and values discussions.  [name] is resting in bed. He is awake and alert. He answers all questions appropriately. He is not in any acute distress. He is aware of scope of palliative medicine services. He met my coworker back in August 2018 as part of early palliative discussions in anticipation of placement of LVAD as destination therapy.   Additionally, I introduced myself and palliative care as follows: Palliative medicine is specialized medical care for people living with serious illness. It focuses on providing relief from the symptoms and stress of a serious illness. The goal is to improve quality of life for both the patient and the family.   His family member daughter [name] is not present at the bedside however I was able to speak with her over the phone. She is aware of the patient's upcoming procedure.  Patient does not appear to be in any acute distress at the present time. He awaits his procedure, likely to be done on Monday. Patient hopes to reduce his symptom burden and have quality driven prolongation  of life after this procedure.  See recommendations below. Thank you for the consult.  HCPOA  daughter [name] is his designated Psychiatrist, paperwork in the room has been reviewed.   SUMMARY OF RECOMMENDATIONS    1.Patient to proceed with LVAD as destination therapy soon.  2. Advanced directives completed, daughter [name] is his designated Psychiatrist.  3. Preparedness planning, CSW and additional support through heart failure teams is accomplished and available. See above.   Thank you for the consult, PMT will follow along.   Code Status/Advance Care Planning:  Full code    Symptom Management:    as above   Palliative Prophylaxis:   Bowel Regimen  Additional Recommendations (Limitations, Scope, Preferences):  Full Scope Treatment  Psycho-social/Spiritual:   Desire for further Chaplaincy support:yes  Additional Recommendations: Caregiving  Support/Resources  Prognosis:   Unable to determine  Discharge Planning: To Be Determined      Primary Diagnoses: Present on Admission: . Acute on chronic systolic CHF (congestive heart failure) ([place]) . Acute on chronic combined systolic and diastolic CHF (congestive heart failure) ([place]) . (Resolved) Chronic apical periodontitis . (Resolved) Retained dental roots . (Resolved) Dental caries . (Resolved) Chronic periodontitis . (Resolved) Loose, teeth . (Resolved)  Bilateral mandibular lingual tori   I have reviewed the medical record, interviewed the patient and family, and examined the patient. The following aspects are pertinent.  Past Medical History:  Diagnosis Date  . Angina   . ASCVD (arteriosclerotic cardiovascular disease)    , Anterior infarction 2005, LAD diagonal bifurcation intervention 03/2004  . Automatic implantable cardiac defibrillator -[employer]       . Benign neoplasm of colon   . CHF (congestive heart failure) ([place])   . Chronic systolic heart failure ([place])   . Coronary artery disease      Widely patent previously placed stents in the left anterior   . Crohn's disease ([place])   . 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 Dyspnea   . Gastroparesis   . GERD (gastroesophageal reflux disease)   . High cholesterol   . Hyperlipidemia   . Hypersomnolent    Previous diagnosis of narcolepsy  . Hypertension, essential   . Ischemic cardiomyopathy    Ejection fraction 15-20% catheterization 2010  . Type II or unspecified type diabetes mellitus without mention of complication, not stated as uncontrolled   . Unspecified gastritis and gastroduodenitis without mention of hemorrhage    Social History   Social History  . Marital status: Married    Spouse name: N/A  . Number of children: N/A  . Years of education: N/A   Social History Main Topics  . Smoking status: Never Smoker  . Smokeless tobacco: Never Used  . Alcohol use No  . Drug use: No  . Sexual activity: No   Other Topics Concern  . None   Social History Narrative  . None   Family History  Problem Relation Age of Onset  . Colon polyps Mother   . Diabetes Mother   . Heart attack Father   . Diabetes Father   . Stroke Paternal Grandfather   . Colitis Sister   . Heart disease Brother   . Colitis Sister   . Colon cancer Neg Hx    Scheduled Meds: . atorvastatin  80 mg Oral q1800  . carvedilol  3.125 mg Oral BID  . Chlorhexidine Gluconate Cloth  6 each Topical Daily  . digoxin  0.125 mg Oral Daily  . gabapentin  300 mg Oral BID  . insulin aspart  0-15 Units Subcutaneous TID WC  . insulin aspart  0-5 Units Subcutaneous QHS  . insulin aspart  7 Units Subcutaneous Q supper  . insulin glargine  21 Units Subcutaneous Daily  . isosorbide-hydrALAZINE  1 tablet Oral TID  . magnesium oxide  400 mg Oral Daily  . metoCLOPramide  5 mg Oral TID AC  . pantoprazole  40 mg Oral Daily  . sodium chloride flush  3 mL Intravenous Q12H  . sodium chloride flush  3 mL Intravenous Q12H  . spironolactone  12.5 mg  Oral Daily   Continuous Infusions: . sodium chloride 10 mL/hr at 06/08/17 0645  . sodium chloride 10 mL/hr at 06/09/17 0600  . sodium chloride    . heparin 1,350 Units/hr (06/09/17 0600)  . lactated ringers    . milrinone 0.125 mcg/kg/min (06/09/17 0600)   PRN Meds:.sodium chloride, sodium chloride, acetaminophen, fentaNYL (SUBLIMAZE) injection, hydrALAZINE, ondansetron (ZOFRAN) IV, ondansetron, oxyCODONE-acetaminophen, sodium chloride flush, sodium chloride flush Medications Prior to Admission:  Prior to Admission medications   Medication Sig Start Date End Date Taking? Authorizing Provider  atorvastatin (LIPITOR) 80 MG tablet Take 1 tablet (80 mg total) by mouth daily  at 6 PM. 11/28/16  Yes Tillery, Satira Mccallum, PA-C  carvedilol (COREG) 6.25 MG tablet Take 0.5 tablets (3.125 mg total) by mouth 2 (two) times daily. 02/13/17  Yes Clegg, Amy D, NP  clopidogrel (PLAVIX) 75 MG tablet Take 1 tablet (75 mg total) by mouth daily. 11/29/16  Yes Shirley Friar, PA-C  digoxin (LANOXIN) 0.125 MG tablet Take 1 tablet (0.125 mg total) by mouth daily. 12/13/16  Yes Larey Dresser, MD  empagliflozin (JARDIANCE) 25 MG TABS tablet Take 25 mg by mouth daily. 05/11/17  Yes Hensel, Jamal Collin, MD  enoxaparin (LOVENOX) 100 MG/ML injection Inject 0.9 mLs (90 mg total) into the skin every 12 (twelve) hours. 05/24/17  Yes Larey Dresser, MD  gabapentin (NEURONTIN) 300 MG capsule Take 1 capsule (300 mg total) by mouth 2 (two) times daily. 05/10/17  Yes [name], MD  insulin aspart (NOVOLOG) 100 UNIT/ML injection Inject 7 Units into the skin daily with supper. 05/11/17  Yes Hensel, Jamal Collin, MD  insulin glargine (LANTUS) 100 UNIT/ML injection Inject 0.21 mLs (21 Units total) into the skin daily. Patient taking differently: Inject 21 Units into the skin every evening.  05/11/17  Yes Hensel, Jamal Collin, MD  isosorbide-hydrALAZINE (BIDIL) 20-37.5 MG tablet Take 1.5 tablets by mouth 3 (three) times  daily. 04/25/17  Yes Larey Dresser, MD  magnesium oxide (MAG-OX) 400 MG tablet Take 1 tablet (400 mg total) by mouth daily. 05/05/17  Yes Larey Dresser, MD  metoCLOPramide (REGLAN) 5 MG tablet Take one tablet 1/2 hr before meals. Patient taking differently: Take by mouth 3 (three) times daily before meals.  01/10/17  Yes Esterwood, Amy S, PA-C  pantoprazole (PROTONIX) 40 MG tablet Take 40 mg by mouth daily.   Yes [provider]  spironolactone (ALDACTONE) 25 MG tablet Take 0.5 tablets (12.5 mg total) by mouth daily. 04/14/17  Yes Carlyle Dolly, MD  torsemide (DEMADEX) 20 MG tablet Take 80 mg (4 Tabs) Daily in the AM and 80 mg (4 Tabs) Every evening. 05/11/17  Yes Hensel, Jamal Collin, MD  warfarin (COUMADIN) 5 MG tablet TAKE AS DIRECTED BY COUMADIN CLINIC Patient taking differently: Take 5-7.5 mg by mouth See admin instructions. 41m on Sun/Mon/Wed/Fri and 7.573mon Tues/Thurs/Sat. TAKE AS DIRECTED BY COUMADIN CLINIC 04/20/17  Yes KlDeboraha SprangMD  nitroGLYCERIN (NITROSTAT) 0.4 MG SL tablet Place 1 tablet (0.4 mg total) under the tongue every 5 (five) minutes as needed for chest pain. 07/13/16   [place] PainMD  ondansetron (ZOFRAN ODT) 4 MG disintegrating tablet Take 1 tablet (4 mg total) by mouth every 6 (six) hours as needed for nausea or vomiting. 01/10/17   Esterwood, Amy S, PA-C   Allergies  Allergen Reactions  . Metformin And Related Diarrhea   Review of Systems Denies chest pain denies shortness of breath Physical Exam Awake alert age-appropriate appearing African-American gentleman resting in bed S1-S2 Clear Abdomen soft No edema Awake alert oriented Mood and affect appear to be appropriate  Vital Signs: BP 125/81   Pulse 85   Temp 97.7 F (36.5 C)   Resp 13   Ht _0  (1.727 m)   Wt 90.4 kg (199 lb 4.7 oz)   SpO2 100%   BMI 30.30 kg/m  Pain Assessment: 0-10 POSS *See Group Information*: 1-Acceptable,Awake and alert Pain Score: 7    SpO2: SpO2: 100  % O2 Device:SpO2: 100 % O2 Flow Rate: .O2 Flow Rate (L/min): 2 L/min  IO: Intake/output summary:  Intake/Output Summary (Last 24 hours) at 06/09/17 1041 Last data filed at 06/09/17 0600  Gross per 24 hour  Intake           895.88 ml  Output              750 ml  Net           145.88 ml    LBM: Last BM Date: 06/06/17 Baseline Weight: Weight: 85.7 kg (189 lb) Most recent weight: Weight: 90.4 kg (199 lb 4.7 oz)     Palliative Assessment/Data:     Time In:  9.40 Time Out:  10.40 Time Total:  60 min  Greater than 50%  of this time was spent counseling and coordinating care related to the above assessment and plan.  Signed by: Loistine Chance, MD  [phone]  Please contact Palliative Medicine Team phone at [phone] for questions and concerns.  For individual provider: See Shea Evans

## 2017-06-10 DIAGNOSIS — K045 Chronic apical periodontitis: Secondary | ICD-10-CM

## 2017-06-10 DIAGNOSIS — I2510 Atherosclerotic heart disease of native coronary artery without angina pectoris: Secondary | ICD-10-CM

## 2017-06-10 DIAGNOSIS — I509 Heart failure, unspecified: Secondary | ICD-10-CM

## 2017-06-10 LAB — CBC WITH DIFFERENTIAL/PLATELET
Basophils Absolute: 0 10*3/uL (ref 0.0–0.1)
Basophils Relative: 0 %
EOS PCT: 3 %
Eosinophils Absolute: 0.3 10*3/uL (ref 0.0–0.7)
HEMATOCRIT: 30 % — AB (ref 39.0–52.0)
Hemoglobin: 9.7 g/dL — ABNORMAL LOW (ref 13.0–17.0)
LYMPHS ABS: 1.4 10*3/uL (ref 0.7–4.0)
LYMPHS PCT: 14 %
MCH: 26.8 pg (ref 26.0–34.0)
MCHC: 32.3 g/dL (ref 30.0–36.0)
MCV: 82.9 fL (ref 78.0–100.0)
Monocytes Absolute: 0.7 10*3/uL (ref 0.1–1.0)
Monocytes Relative: 7 %
Neutro Abs: 7.7 10*3/uL (ref 1.7–7.7)
Neutrophils Relative %: 76 %
PLATELETS: 192 10*3/uL (ref 150–400)
RBC: 3.62 MIL/uL — AB (ref 4.22–5.81)
RDW: 15.4 % (ref 11.5–15.5)
WBC: 10.1 10*3/uL (ref 4.0–10.5)

## 2017-06-10 LAB — COMPREHENSIVE METABOLIC PANEL
ALT: 59 U/L (ref 17–63)
ANION GAP: 8 (ref 5–15)
AST: 54 U/L — AB (ref 15–41)
Albumin: 3 g/dL — ABNORMAL LOW (ref 3.5–5.0)
Alkaline Phosphatase: 101 U/L (ref 38–126)
BUN: 31 mg/dL — AB (ref 6–20)
CHLORIDE: 103 mmol/L (ref 101–111)
CO2: 26 mmol/L (ref 22–32)
Calcium: 8.6 mg/dL — ABNORMAL LOW (ref 8.9–10.3)
Creatinine, Ser: 1.57 mg/dL — ABNORMAL HIGH (ref 0.61–1.24)
GFR, EST AFRICAN AMERICAN: 57 mL/min — AB (ref >60)
GFR, EST NON AFRICAN AMERICAN: 49 mL/min — AB (ref >60)
GLUCOSE: 128 mg/dL — AB (ref 65–99)
POTASSIUM: 4.2 mmol/L (ref 3.5–5.1)
Sodium: 137 mmol/L (ref 135–145)
Total Bilirubin: 0.7 mg/dL (ref 0.3–1.2)
Total Protein: 7 g/dL (ref 6.5–8.1)

## 2017-06-10 LAB — COOXEMETRY PANEL
CARBOXYHEMOGLOBIN: 1.5 % (ref 0.5–1.5)
METHEMOGLOBIN: 1.4 % (ref 0.0–1.5)
O2 SAT: 85.4 %
Total hemoglobin: 10.1 g/dL — ABNORMAL LOW (ref 12.0–16.0)

## 2017-06-10 LAB — PROTIME-INR
INR: 0.99
Prothrombin Time: 13 seconds (ref 11.4–15.2)

## 2017-06-10 LAB — HEPARIN LEVEL (UNFRACTIONATED)
HEPARIN UNFRACTIONATED: 0.29 [IU]/mL — AB (ref 0.30–0.70)
HEPARIN UNFRACTIONATED: 0.23 [IU]/mL — AB (ref 0.30–0.70)
Heparin Unfractionated: <0.1 IU/mL — ABNORMAL LOW (ref 0.30–0.70)

## 2017-06-10 LAB — MAGNESIUM: Magnesium: 2.1 mg/dL (ref 1.7–2.4)

## 2017-06-10 MED ORDER — SORBITOL 70 % PO SOLN
30.0000 mL | Freq: Once | ORAL | Status: AC
  Administered 2017-06-10: 30 mL via ORAL
  Filled 2017-06-10 (×2): qty 30

## 2017-06-10 MED ORDER — FUROSEMIDE 10 MG/ML IJ SOLN
80.0000 mg | Freq: Once | INTRAMUSCULAR | Status: AC
  Administered 2017-06-10: 80 mg via INTRAVENOUS
  Filled 2017-06-10: qty 8

## 2017-06-10 MED ORDER — CHLORHEXIDINE GLUCONATE 0.12 % MT SOLN
15.0000 mL | Freq: Four times a day (QID) | OROMUCOSAL | Status: DC
  Administered 2017-06-10 – 2017-06-11 (×7): 15 mL via OROMUCOSAL
  Filled 2017-06-10 (×8): qty 15

## 2017-06-10 NOTE — Progress Notes (Signed)
2 Days Post-Op Procedure(s) (LRB): MULTIPLE EXTRACTION WITH ALVEOLOPLASTY AND PRE PROSTHETIC SURGERY AS NEEDED (N/A) Subjective: Stable after dental extractions- min bleeding Hemodynamics stable with good RV function NSR Plavix washout on iv heparin in preparation for implantable VAD CXR clear Cont milrinone and heparin until surgery Mon am  Patient understands he is being treated for his advanced heart failure with destination VAD therapy and that he is not currently a heart transplant candidate because he is not on a transplant waiting list Objective: Vital signs in last 24 hours: Temp:  [97.4 F (36.3 C)-97.9 F (36.6 C)] 97.9 F (36.6 C) (10/06 0700) Pulse Rate:  [75-84] 80 (10/06 0700) Cardiac Rhythm: Normal sinus rhythm (10/06 0445) Resp:  [7-16] 10 (10/06 0700) BP: (109-142)/(69-87) 130/78 (10/06 0700) SpO2:  [94 %-100 %] 100 % (10/06 0700) Weight:  [195 lb 5.2 oz (88.6 kg)] 195 lb 5.2 oz (88.6 kg) (10/06 0500)  Hemodynamic parameters for last 24 hours: PAP: (26)/(19) 26/19 CVP:  [1 mmHg-24 mmHg] 12 mmHg  Intake/Output from previous day: 10/05 0701 - 10/06 0700 In: 1026.1 [P.O.:480; I.V.:546.1] Out: 975 [Urine:975] Intake/Output this shift: No intake/output data recorded.       Exam    General- alert and comfortable, mouth swollen   Lungs- clear without rales, wheezes   Cor- regular rate and rhythm, no murmur , gallop   Abdomen- soft, non-tender   Extremities - warm, non-tender, minimal edema   Neuro- oriented, appropriate, no focal weakness   Lab Results:  Recent Labs  06/09/17 0516 06/10/17 0609  WBC 11.6* 10.1  HGB 9.0* 9.7*  HCT 28.2* 30.0*  PLT 157 192   BMET:  Recent Labs  06/09/17 0516 06/10/17 0609  NA 137 137  K 4.4 4.2  CL 103 103  CO2 26 26  GLUCOSE 167* 128*  BUN 35* 31*  CREATININE 1.65* 1.57*  CALCIUM 8.5* 8.6*    PT/INR:  Recent Labs  06/10/17 0609  LABPROT 13.0  INR 0.99   ABG    Component Value Date/Time   PHART  7.437 04/05/2017 1706   HCO3 31.0 (H) 06/07/2017 1033   HCO3 29.6 (H) 06/07/2017 1033   TCO2 33 (H) 06/07/2017 1033   TCO2 31 06/07/2017 1033   ACIDBASEDEF 2.0 04/05/2017 1706   O2SAT 85.4 06/10/2017 0600   CBG (last 3)   Recent Labs  06/08/17 2150 06/09/17 0816 06/09/17 1140  GLUCAP 148* 155* 110*    Assessment/Plan: S/P Procedure(s) (LRB): MULTIPLE EXTRACTION WITH ALVEOLOPLASTY AND PRE PROSTHETIC SURGERY AS NEEDED (N/A) Ischemic cardiomyopathy Chronic stage 3 kidney disease Prior AICD, LAD stent DT VAD scheduled 10-8  LOS: 3 days    Tharon Aquas Trigt III 06/10/2017

## 2017-06-10 NOTE — Progress Notes (Addendum)
ANTICOAGULATION CONSULT NOTE - [facility] for heparin Indication: DVT  Allergies  Allergen Reactions  . Metformin And Related Diarrhea    Patient Measurements: Height: 5' 8"  (172.7 cm) Weight: 195 lb 5.2 oz (88.6 kg) IBW/kg (Calculated) : 68.4 Heparin Dosing Weight: 85 kg  Vital Signs: Temp: 97.9 F (36.6 C) (10/06 0700) Temp Source: Axillary (10/06 0700) BP: 130/78 (10/06 0700) Pulse Rate: 80 (10/06 0700)  Labs:  Recent Labs  06/07/17 0736  06/08/17 0419 06/09/17 0516  06/09/17 1930 06/09/17 2210 06/10/17 0609  HGB  --   < > 9.9* 9.0*  --   --   --  9.7*  HCT  --   --  30.9* 28.2*  --   --   --  30.0*  PLT  --   --  207 157  --   --   --  192  LABPROT 12.9  --   --   --   --   --   --   --   INR 0.98  --   --   --   --   --   --   --   HEPARINUNFRC  --   < >  --   --   < > 1.08* 0.47 <0.10*  CREATININE 2.09*  --  1.55* 1.65*  --   --   --  1.57*  < > = values in this interval not displayed.  Estimated Creatinine Clearance: 59.6 mL/min (A) (by C-G formula based on SCr of 1.57 mg/dL (H)).  Assessment: 64 yoM admitted for LVAD workup. Pt on warfarin PTA for recurrent DVTs now to transition to heparin per pharmacy. Pt is s/p dental extractions and per DDS okay to resume heparin drip 10/4 with reduced goal post-procedure 2/2 oral bleeding. Heparin level this morning undetectable but per RN peripheral line was occluded. Heparin has been resumed at previous rate, will recheck level 6 hours after restart. No S/Sx bleeding per RN, CBC stable.  Goal of Therapy:  Heparin Level 0.3-0.4 Monitor platelets by anticoagulation protocol: Yes   Plan:  -Continue heparin 1100 units/hr -Recheck 6-hr heparin level -Check heparin level, CBC, S/Sx bleeding daily  ADDENDUM: Repeat heparin level slightly subtherapeutic at 0.29. Per TCTS will increase heparin level goal to 0.4 - 0.5.  Goal of Therapy:  Heparin Level 0.4-0.5 Monitor platelets by  anticoagulation protocol: Yes  Plan: -Increase heparin to 1200 units/hr -Check 6-hr heparin level  Arrie Senate, PharmD PGY-2 Cardiology Pharmacy Resident Pager: [phone] 06/10/2017

## 2017-06-10 NOTE — Progress Notes (Signed)
Cardiac Rehab Note: Cardiac Rehab RN in to see patient. Patient standing in room with primary RN. Patient just completed 190 ft ambulation x 1 assist. Unable to complete 6 min walk at this time related to patient exhaustion. Patient completed official 6 min walk test on 05/23/17 during cardiac rehab orientation. Results printed and placed on patients chart. Will follow up with patient post surgery.

## 2017-06-10 NOTE — Plan of Care (Signed)
Problem: Activity: Goal: Ability to return to baseline activity level will improve Outcome: Progressing Pt oob x2 06/09/17  Problem: Cardiovascular: Goal: Ability to achieve and maintain adequate cardiovascular perfusion will improve Outcome: Progressing Within parameters on current gtt's Goal: Vascular access site(s) Level 0-1 will be maintained Outcome: Progressing Level 0  Problem: Education: Goal: Understanding of CV disease, CV risk reduction, and recovery process will improve Outcome: Progressing Education ongoing  Problem: Health Behavior/Discharge Planning: Goal: Ability to safely manage health-related needs after discharge will improve Outcome: Progressing Education ongoing

## 2017-06-10 NOTE — Progress Notes (Signed)
ANTICOAGULATION CONSULT NOTE - [facility] for heparin Indication: DVT  Allergies  Allergen Reactions  . Metformin And Related Diarrhea    Patient Measurements: Height: 5' 8"  (172.7 cm) Weight: 195 lb 5.2 oz (88.6 kg) IBW/kg (Calculated) : 68.4 Heparin Dosing Weight: 85 kg  Vital Signs: Temp: 97.8 F (36.6 C) (10/06 1126) Temp Source: Axillary (10/06 1126) BP: 125/73 (10/06 1900) Pulse Rate: 86 (10/06 1900)  Labs:  Recent Labs  06/08/17 0419 06/09/17 0516  06/10/17 0609 06/10/17 1200 06/10/17 1905  HGB 9.9* 9.0*  --  9.7*  --   --   HCT 30.9* 28.2*  --  30.0*  --   --   PLT 207 157  --  192  --   --   LABPROT  --   --   --  13.0  --   --   INR  --   --   --  0.99  --   --   HEPARINUNFRC  --   --   < > <0.10* 0.29* 0.23*  CREATININE 1.55* 1.65*  --  1.57*  --   --   < > = values in this interval not displayed.  Estimated Creatinine Clearance: 59.6 mL/min (A) (by C-G formula based on SCr of 1.57 mg/dL (H)).  Assessment: 61 yoM admitted for LVAD workup. Pt on warfarin PTA for recurrent DVTs now to transition to heparin per pharmacy. Pt is s/p dental extractions and per DDS okay to resume heparin drip 10/4 with reduced goal post-procedure 2/2 oral bleeding. Plans noted for VAD on Monday.  -heparin level= 0.23 after increase to 1250 units/hr  Goal of Therapy:  Heparin Level 0.4-0.5 Monitor platelets by anticoagulation protocol: Yes   Plan:  -Increase heparin to 1350 units/hr -Heparin level and CBC in am  Hildred Laser, Pharm D 06/10/2017 8:14 PM

## 2017-06-10 NOTE — Progress Notes (Addendum)
Advanced Heart Failure Rounding Note  PCP:  Primary Cardiologist:   Subjective:    Admitted for optimization prior to VAD and teeth extractions.   10/4 S/P multiple tooth extractions.   Mouth still sore but improving. Denies dyspnea. Remains on heparin for plavix washout. No bleeding. On milrinone 0.125. Co-ox 85% (?)  CVP 8-10 per RN. I tried multiple times to measure personally but RIJ introducer not working well.   Swan Numbers post-op CVP 4   PAP 20/13 CO 5.57 CI 2.7   Co-ox 82%  RHC 06/07/2017  RA 3 RV 49/5 PA 48/14, mean 27 PCWP mean 14 Oxygen saturations: PA 63% AO 95% Cardiac Output (Thermo) 4.0 Cardiac Index (Thermo) 2.0  PVR 3.25 WU   Objective:   Weight Range: 88.6 kg (195 lb 5.2 oz) Body mass index is 29.7 kg/m.   Vital Signs:   Temp:  [97.4 F (36.3 C)-97.9 F (36.6 C)] 97.8 F (36.6 C) (10/06 1126) Pulse Rate:  [75-84] 79 (10/06 0800) Resp:  [7-16] 14 (10/06 0800) BP: (109-142)/(69-87) 130/73 (10/06 0800) SpO2:  [95 %-100 %] 99 % (10/06 0800) Weight:  [88.6 kg (195 lb 5.2 oz)] 88.6 kg (195 lb 5.2 oz) (10/06 0500) Last BM Date: 06/06/17  Weight change: Filed Weights   06/08/17 0500 06/09/17 0500 06/10/17 0500  Weight: 87.5 kg (192 lb 14.4 oz) 90.4 kg (199 lb 4.7 oz) 88.6 kg (195 lb 5.2 oz)    Intake/Output:   Intake/Output Summary (Last 24 hours) at 06/10/17 1207 Last data filed at 06/10/17 1000  Gross per 24 hour  Intake           514.37 ml  Output              975 ml  Net          -460.63 ml      Physical Exam   General:  Sitting in chair. No resp difficulty HEENT: normal x for recent dental extractions with mild oozing and swelling Neck: supple. RIJ introducer . Carotids 2+ bilat; no bruits. No lymphadenopathy or thryomegaly appreciated. Cor: PMI laterally displaced. Regular rate & rhythm. + s3 Lungs: clear Abdomen: soft, nontender, nondistended. No hepatosplenomegaly. No bruits or masses. Good bowel sounds. Extremities:  no cyanosis, clubbing, rash, edema Neuro: alert & orientedx3, cranial nerves grossly intact. moves all 4 extremities w/o difficulty. Affect pleasant   Telemetry   NSR 70s. Occasional PVCs personally reviewed .    EKG    N/A  Labs    CBC  Recent Labs  06/09/17 0516 06/10/17 0609  WBC 11.6* 10.1  NEUTROABS 9.6* 7.7  HGB 9.0* 9.7*  HCT 28.2* 30.0*  MCV 82.0 82.9  PLT 157 229   Basic Metabolic Panel  Recent Labs  06/09/17 0516 06/09/17 1632 06/10/17 0609  NA 137  --  137  K 4.4  --  4.2  CL 103  --  103  CO2 26  --  26  GLUCOSE 167*  --  128*  BUN 35*  --  31*  CREATININE 1.65*  --  1.57*  CALCIUM 8.5*  --  8.6*  MG  --  2.1 2.1   Liver Function Tests  Recent Labs  06/09/17 0516 06/10/17 0609  AST 83* 54*  ALT 65* 59  ALKPHOS 101 101  BILITOT 0.5 0.7  PROT 6.3* 7.0  ALBUMIN 2.7* 3.0*   No results for input(s): LIPASE, AMYLASE in the last 72 hours. Cardiac Enzymes No results for input(s):  CKTOTAL, CKMB, CKMBINDEX, TROPONINI in the last 72 hours.  BNP: BNP (last 3 results)  Recent Labs  02/08/17 1250 04/05/17 1510 06/07/17 2234  BNP 1,330.6* 2,214.8* 324.2*    ProBNP (last 3 results) No results for input(s): PROBNP in the last 8760 hours.   D-Dimer No results for input(s): DDIMER in the last 72 hours. Hemoglobin A1C  Recent Labs  06/07/17 2234  HGBA1C 10.1*   Fasting Lipid Panel No results for input(s): CHOL, HDL, LDLCALC, TRIG, CHOLHDL, LDLDIRECT in the last 72 hours. Thyroid Function Tests No results for input(s): TSH, T4TOTAL, T3FREE, THYROIDAB in the last 72 hours.  Invalid input(s): FREET3  Other results:   Imaging    No results found.   Medications:     Scheduled Medications: . atorvastatin  80 mg Oral q1800  . carvedilol  3.125 mg Oral BID  . chlorhexidine  15 mL Mouth/Throat QID  . Chlorhexidine Gluconate Cloth  6 each Topical Daily  . digoxin  0.125 mg Oral Daily  . gabapentin  300 mg Oral BID  . insulin  aspart  0-15 Units Subcutaneous TID WC  . insulin aspart  0-5 Units Subcutaneous QHS  . insulin aspart  7 Units Subcutaneous Q supper  . insulin glargine  21 Units Subcutaneous Daily  . isosorbide-hydrALAZINE  1 tablet Oral TID  . magnesium oxide  400 mg Oral Daily  . metoCLOPramide  5 mg Oral TID AC  . pantoprazole  40 mg Oral Daily  . sodium chloride flush  3 mL Intravenous Q12H  . sodium chloride flush  3 mL Intravenous Q12H  . sorbitol  30 mL Oral Once  . spironolactone  12.5 mg Oral Daily    Infusions: . sodium chloride 10 mL/hr at 06/08/17 0645  . sodium chloride 10 mL/hr at 06/10/17 0600  . sodium chloride    . heparin 1,100 Units/hr (06/10/17 0600)  . lactated ringers    . milrinone 0.125 mcg/kg/min (06/10/17 0600)    PRN Medications: sodium chloride, sodium chloride, acetaminophen, fentaNYL (SUBLIMAZE) injection, hydrALAZINE, ondansetron (ZOFRAN) IV, ondansetron, oxyCODONE-acetaminophen, sodium chloride flush, sodium chloride flush    Patient Profile   51 yo with history of CAD s/p anterior MI in 2010 and chronic systolic CHF (ischemic cardiomyopathy) as well as CKD stage III is admittted today for hemodynamic optimization prior to LVAD placement.   Assessment/Plan   1. Chronic systolic CHF: Ischemic cardiomyopathy. St Jude ICD. NSTEMI in March 2018 with DES to LAD and RCA, complicated by cardiogenic shock, low output requiring milrinone. Most recent echo 8/18 with EF 20-25%, moderate MR. NYHA class IIIb-IVsymptoms (fluctuate). I think he is nearing the need for LVAD placement. CPX in 8/18 showed a severe functional impairment from HF and creatinine is worsening, likely cardiorenal. NYHA class IIIb-IV, fluctuates. He has seen Dr. Prescott Gum. He still needs dental extractions.  He had RHC today showing optimized filling pressure and marginal cardiac index (2.0 by thermodilution).  [name] was left in place.  Creatinine has trended down from 2.1>1.65>1.57 with inotrope  support .  No change today. Continue HF meds. - CVP back up to 8-10 range per RN. Will give one dose IV lasix today.  - Co-ox stable on low-dose milrinone. Will continue  - Continue Bidil1 tabs tid - Continue low dose Coreg.  - Continue digoxin   - Can continue low dose spironolactone.  - Not on ACEI/ARB/ARNI with CKD.  - He is off Plavix for washout prior to LVAD (now > 6 month post-PCI).  -  Continue heparin. D/w PharmD - Dr. Prescott Gum has seen this aim. For VAD Monday 2. Acute on CKD stage III: Suspect a cardiorenal component given worsening over the last few months though diabetic nephropathy likely plays a role. On admit Creatinine 2.09 but now 1.57.     - Continue low dose milrinone.  3. CAD: NSTEMI in 3/18. LHC with 99% ulcerated lesion proximal RCA with left to right collaterals, 95% mid LAD stenosis after mid LAD stent. s/p PCI to RCA and LAD on 11/25/16.  - No s/s ischemia. - Off plavix with planned LVAD Monday. On heparin. hgb stable at 9.7 - Continue Coreg as above.  - Continue high intensity statin, good lipids recently.  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 Recent V/Q scan did not show acute or chronic PE.  He is a heterozygote for Factor V Leiden, plan for for ASA 325 and warfarin INR 2-2.5 when LVAD placed.  Will cover with heparin gtt pre-op => heparin restarted 2200 on 10/4.   5. Chronic N/V/D with gastroparesis: Follows with GI. Quiescent currently.  6. DM: On Jardiance at home, will cover with sliding scale> DM coordinator has seen and increased insulin.   7. S/P Multiple Tooth Extractions 4-15, 19-23, and 27-29. Continue oral care per Dr Enrique Sack.   Length of Stay: 3  [name], MD  06/10/2017, 12:07 PM  Advanced Heart Failure Team Pager [phone] (M-F; 7a - 4p)  Please contact [facility] for night-coverage after hours (4p -7a ) and weekends on [email or web address]

## 2017-06-11 LAB — HEPARIN LEVEL (UNFRACTIONATED)
HEPARIN UNFRACTIONATED: 0.36 [IU]/mL (ref 0.30–0.70)
Heparin Unfractionated: 0.4 IU/mL (ref 0.30–0.70)
Heparin Unfractionated: 0.2 IU/mL — ABNORMAL LOW (ref 0.30–0.70)

## 2017-06-11 LAB — COOXEMETRY PANEL
CARBOXYHEMOGLOBIN: 0.9 % (ref 0.5–1.5)
METHEMOGLOBIN: 1.6 % — AB (ref 0.0–1.5)
O2 SAT: 66.6 %
Total hemoglobin: 10.4 g/dL — ABNORMAL LOW (ref 12.0–16.0)

## 2017-06-11 LAB — CBC WITH DIFFERENTIAL/PLATELET
BASOS ABS: 0 10*3/uL (ref 0.0–0.1)
BASOS PCT: 0 %
Eosinophils Absolute: 0.4 10*3/uL (ref 0.0–0.7)
Eosinophils Relative: 3 %
HEMATOCRIT: 31.8 % — AB (ref 39.0–52.0)
HEMOGLOBIN: 9.9 g/dL — AB (ref 13.0–17.0)
Lymphocytes Relative: 14 %
Lymphs Abs: 1.6 10*3/uL (ref 0.7–4.0)
MCH: 25.6 pg — ABNORMAL LOW (ref 26.0–34.0)
MCHC: 31.1 g/dL (ref 30.0–36.0)
MCV: 82.2 fL (ref 78.0–100.0)
MONO ABS: 0.7 10*3/uL (ref 0.1–1.0)
Monocytes Relative: 6 %
NEUTROS ABS: 8.5 10*3/uL — AB (ref 1.7–7.7)
NEUTROS PCT: 77 %
Platelets: 213 10*3/uL (ref 150–400)
RBC: 3.87 MIL/uL — AB (ref 4.22–5.81)
RDW: 15 % (ref 11.5–15.5)
WBC: 11.2 10*3/uL — AB (ref 4.0–10.5)

## 2017-06-11 LAB — COMPREHENSIVE METABOLIC PANEL
ALK PHOS: 117 U/L (ref 38–126)
ALT: 48 U/L (ref 17–63)
ANION GAP: 8 (ref 5–15)
AST: 41 U/L (ref 15–41)
Albumin: 3.1 g/dL — ABNORMAL LOW (ref 3.5–5.0)
BILIRUBIN TOTAL: 0.7 mg/dL (ref 0.3–1.2)
BUN: 27 mg/dL — ABNORMAL HIGH (ref 6–20)
CALCIUM: 8.6 mg/dL — AB (ref 8.9–10.3)
CO2: 28 mmol/L (ref 22–32)
Chloride: 100 mmol/L — ABNORMAL LOW (ref 101–111)
Creatinine, Ser: 1.68 mg/dL — ABNORMAL HIGH (ref 0.61–1.24)
GFR calc non Af Amer: 45 mL/min — ABNORMAL LOW (ref >60)
GFR, EST AFRICAN AMERICAN: 52 mL/min — AB (ref >60)
Glucose, Bld: 136 mg/dL — ABNORMAL HIGH (ref 65–99)
POTASSIUM: 4.1 mmol/L (ref 3.5–5.1)
Sodium: 136 mmol/L (ref 135–145)
TOTAL PROTEIN: 7.2 g/dL (ref 6.5–8.1)

## 2017-06-11 LAB — URINALYSIS, ROUTINE W REFLEX MICROSCOPIC
Bacteria, UA: NONE SEEN
Bilirubin Urine: NEGATIVE
Glucose, UA: >=500 mg/dL — AB
Hgb urine dipstick: NEGATIVE
Ketones, ur: NEGATIVE mg/dL
Leukocytes, UA: NEGATIVE
Nitrite: NEGATIVE
Protein, ur: 30 mg/dL — AB
Specific Gravity, Urine: 1.016 (ref 1.005–1.030)
Squamous Epithelial / LPF: NONE SEEN
pH: 5 (ref 5.0–8.0)

## 2017-06-11 LAB — BLOOD GAS, ARTERIAL
Acid-Base Excess: 3.9 mmol/L — ABNORMAL HIGH (ref 0.0–2.0)
Bicarbonate: 28.4 mmol/L — ABNORMAL HIGH (ref 20.0–28.0)
Drawn by: 24513
FIO2: 21
O2 Saturation: 90 %
Patient temperature: 99.1
pCO2 arterial: 47.8 mmHg (ref 32.0–48.0)
pH, Arterial: 7.393 (ref 7.350–7.450)
pO2, Arterial: 59.1 mmHg — ABNORMAL LOW (ref 83.0–108.0)

## 2017-06-11 LAB — APTT: aPTT: 58 seconds — ABNORMAL HIGH (ref 24–36)

## 2017-06-11 LAB — PREPARE RBC (CROSSMATCH)

## 2017-06-11 MED ORDER — CHLORHEXIDINE GLUCONATE CLOTH 2 % EX PADS
6.0000 | MEDICATED_PAD | Freq: Once | CUTANEOUS | Status: AC
  Administered 2017-06-12: 6 via TOPICAL

## 2017-06-11 MED ORDER — TRANEXAMIC ACID 1000 MG/10ML IV SOLN
1.5000 mg/kg/h | INTRAVENOUS | Status: AC
  Administered 2017-06-12: 1.5 mg/kg/h via INTRAVENOUS
  Filled 2017-06-11: qty 25

## 2017-06-11 MED ORDER — MILRINONE LACTATE IN DEXTROSE 20-5 MG/100ML-% IV SOLN: 0.3000 ug/kg/min | INTRAVENOUS | Status: DC

## 2017-06-11 MED ORDER — VANCOMYCIN HCL 1000 MG IV SOLR
1000.0000 mg | INTRAVENOUS | Status: AC
  Administered 2017-06-12: 1000 mg
  Filled 2017-06-11: qty 1000

## 2017-06-11 MED ORDER — PHENYLEPHRINE HCL 10 MG/ML IJ SOLN
0.0000 ug/min | INTRAMUSCULAR | Status: DC
  Filled 2017-06-11: qty 2

## 2017-06-11 MED ORDER — SODIUM CHLORIDE 0.9 % IV SOLN
0.0400 [IU]/min | INTRAVENOUS | Status: DC
  Filled 2017-06-11: qty 2

## 2017-06-11 MED ORDER — SODIUM CHLORIDE 0.9 % IV SOLN
1.5000 mg/kg/h | INTRAVENOUS | Status: DC
  Filled 2017-06-11: qty 25

## 2017-06-11 MED ORDER — DOBUTAMINE IN D5W 4-5 MG/ML-% IV SOLN
2.0000 ug/kg/min | INTRAVENOUS | Status: DC
  Filled 2017-06-11: qty 250

## 2017-06-11 MED ORDER — CHLORHEXIDINE GLUCONATE CLOTH 2 % EX PADS
6.0000 | MEDICATED_PAD | Freq: Once | CUTANEOUS | Status: AC
  Administered 2017-06-11 (×2): 6 via TOPICAL

## 2017-06-11 MED ORDER — SODIUM CHLORIDE 0.9 % IV SOLN
INTRAVENOUS | Status: AC
  Administered 2017-06-12: 1.4 [IU]/h via INTRAVENOUS
  Filled 2017-06-11: qty 1

## 2017-06-11 MED ORDER — DEXTROSE 5 % IV SOLN
750.0000 mg | INTRAVENOUS | Status: DC
  Filled 2017-06-11: qty 750

## 2017-06-11 MED ORDER — TRANEXAMIC ACID (OHS) BOLUS VIA INFUSION
15.0000 mg/kg | INTRAVENOUS | Status: DC
  Filled 2017-06-11: qty 1302

## 2017-06-11 MED ORDER — POTASSIUM CHLORIDE 2 MEQ/ML IV SOLN
80.0000 meq | INTRAVENOUS | Status: DC
  Filled 2017-06-11: qty 40

## 2017-06-11 MED ORDER — DEXMEDETOMIDINE HCL IN NACL 400 MCG/100ML IV SOLN
0.1000 ug/kg/h | INTRAVENOUS | Status: DC
  Filled 2017-06-11: qty 100

## 2017-06-11 MED ORDER — DOBUTAMINE IN D5W 4-5 MG/ML-% IV SOLN: 2.0000 ug/kg/min | INTRAVENOUS | Status: DC

## 2017-06-11 MED ORDER — FLUCONAZOLE IN SODIUM CHLORIDE 400-0.9 MG/200ML-% IV SOLN
400.0000 mg | INTRAVENOUS | Status: AC
  Administered 2017-06-12: 400 mg via INTRAVENOUS
  Filled 2017-06-11 (×2): qty 200

## 2017-06-11 MED ORDER — NOREPINEPHRINE BITARTRATE 1 MG/ML IV SOLN
0.0000 ug/min | INTRAVENOUS | Status: DC
  Filled 2017-06-11: qty 4

## 2017-06-11 MED ORDER — SODIUM CHLORIDE 0.9 % IV SOLN
INTRAVENOUS | Status: DC
  Filled 2017-06-11: qty 1

## 2017-06-11 MED ORDER — CHLORHEXIDINE GLUCONATE 0.12 % MT SOLN
15.0000 mL | Freq: Once | OROMUCOSAL | Status: AC
  Administered 2017-06-12: 15 mL via OROMUCOSAL
  Filled 2017-06-11: qty 15

## 2017-06-11 MED ORDER — CHLORHEXIDINE GLUCONATE CLOTH 2 % EX PADS
6.0000 | MEDICATED_PAD | Freq: Once | CUTANEOUS | Status: AC
  Administered 2017-06-11: 6 via TOPICAL

## 2017-06-11 MED ORDER — EPINEPHRINE PF 1 MG/ML IJ SOLN
0.0000 ug/min | INTRAVENOUS | Status: DC
  Filled 2017-06-11: qty 4

## 2017-06-11 MED ORDER — DIAZEPAM 2 MG PO TABS
2.0000 mg | ORAL_TABLET | Freq: Once | ORAL | Status: AC
  Administered 2017-06-12: 2 mg via ORAL
  Filled 2017-06-11: qty 1

## 2017-06-11 MED ORDER — TRANEXAMIC ACID (OHS) BOLUS VIA INFUSION
15.0000 mg/kg | INTRAVENOUS | Status: AC
  Administered 2017-06-12: 1302 mg via INTRAVENOUS
  Filled 2017-06-11: qty 1302

## 2017-06-11 MED ORDER — DEXTROSE 5 % IV SOLN
1.5000 g | INTRAVENOUS | Status: AC
  Administered 2017-06-12: .75 g via INTRAVENOUS
  Administered 2017-06-12: 1.5 g via INTRAVENOUS
  Filled 2017-06-11: qty 1.5

## 2017-06-11 MED ORDER — TRANEXAMIC ACID (OHS) PUMP PRIME SOLUTION
2.0000 mg/kg | INTRAVENOUS | Status: DC
  Filled 2017-06-11: qty 1.74

## 2017-06-11 MED ORDER — EPINEPHRINE PF 1 MG/ML IJ SOLN
0.0000 ug/min | INTRAMUSCULAR | Status: AC
  Administered 2017-06-12 (×2): 2 ug/min via INTRAVENOUS
  Filled 2017-06-11: qty 4

## 2017-06-11 MED ORDER — DOPAMINE-DEXTROSE 3.2-5 MG/ML-% IV SOLN: 0.0000 ug/kg/min | INTRAVENOUS | Status: DC

## 2017-06-11 MED ORDER — MAGNESIUM SULFATE 50 % IJ SOLN
40.0000 meq | INTRAMUSCULAR | Status: DC
  Filled 2017-06-11: qty 10

## 2017-06-11 MED ORDER — SODIUM CHLORIDE 0.9 % IV SOLN
600.0000 mg | INTRAVENOUS | Status: AC
  Administered 2017-06-12: 600 mg via INTRAVENOUS
  Filled 2017-06-11: qty 600

## 2017-06-11 MED ORDER — DOPAMINE-DEXTROSE 3.2-5 MG/ML-% IV SOLN
0.0000 ug/kg/min | INTRAVENOUS | Status: DC
  Filled 2017-06-11: qty 250

## 2017-06-11 MED ORDER — VASOPRESSIN 20 UNIT/ML IV SOLN
0.0400 [IU]/min | INTRAVENOUS | Status: DC
  Filled 2017-06-11: qty 2

## 2017-06-11 MED ORDER — SODIUM CHLORIDE 0.9 % IV SOLN
INTRAVENOUS | Status: DC
  Filled 2017-06-11: qty 30

## 2017-06-11 MED ORDER — MILRINONE LACTATE IN DEXTROSE 20-5 MG/100ML-% IV SOLN
0.3000 ug/kg/min | INTRAVENOUS | Status: DC
  Filled 2017-06-11: qty 100

## 2017-06-11 MED ORDER — NITROGLYCERIN IN D5W 200-5 MCG/ML-% IV SOLN
0.0000 ug/min | INTRAVENOUS | Status: DC
Start: 2017-06-12 — End: 2017-06-12
  Filled 2017-06-11: qty 250

## 2017-06-11 MED ORDER — BISACODYL 5 MG PO TBEC: 5.0000 mg | DELAYED_RELEASE_TABLET | Freq: Once | ORAL | Status: DC

## 2017-06-11 MED ORDER — NOREPINEPHRINE BITARTRATE 1 MG/ML IV SOLN
0.0000 ug/min | INTRAVENOUS | Status: AC
  Administered 2017-06-12: 3 ug/min via INTRAVENOUS
  Filled 2017-06-11: qty 4

## 2017-06-11 MED ORDER — TEMAZEPAM 15 MG PO CAPS
15.0000 mg | ORAL_CAPSULE | Freq: Once | ORAL | Status: AC | PRN
  Administered 2017-06-11: 15 mg via ORAL
  Filled 2017-06-11: qty 1

## 2017-06-11 MED ORDER — NITROGLYCERIN IN D5W 200-5 MCG/ML-% IV SOLN: 0.0000 ug/min | INTRAVENOUS | Status: DC

## 2017-06-11 MED ORDER — CHLORHEXIDINE GLUCONATE CLOTH 2 % EX PADS: 6.0000 | MEDICATED_PAD | Freq: Once | CUTANEOUS | Status: AC

## 2017-06-11 MED ORDER — MUPIROCIN 2 % EX OINT
1.0000 "application " | TOPICAL_OINTMENT | Freq: Two times a day (BID) | CUTANEOUS | Status: AC
  Administered 2017-06-11 (×2): 1 via NASAL
  Filled 2017-06-11: qty 22

## 2017-06-11 MED ORDER — VANCOMYCIN HCL 10 G IV SOLR
1500.0000 mg | INTRAVENOUS | Status: AC
  Administered 2017-06-12: 1500 mg via INTRAVENOUS
  Filled 2017-06-11: qty 1500

## 2017-06-11 MED ORDER — DEXMEDETOMIDINE HCL IN NACL 400 MCG/100ML IV SOLN
0.1000 ug/kg/h | INTRAVENOUS | Status: AC
  Administered 2017-06-12: .5 ug/kg/h via INTRAVENOUS

## 2017-06-11 NOTE — Plan of Care (Signed)
Problem: Activity: Goal: Risk for activity intolerance will decrease Outcome: Progressing Pt up to chair and ambulating in hall. Pt tolerating intervention and walking distances have increased.

## 2017-06-11 NOTE — Progress Notes (Signed)
Cardiac surgery booklet given to pt at 1700. Information reviewed. Pt is reading content at this moment and will ask question.

## 2017-06-11 NOTE — Progress Notes (Signed)
Advanced Heart Failure Rounding Note  PCP:  Primary Cardiologist:   Subjective:    Admitted for optimization prior to VAD and teeth extractions.   10/4 S/P multiple tooth extractions.   Feels good.  Mouth less sore. No dyspnea. No bleeding on heparin. Co-ox 66% CVP 7-8   Swan Numbers post-op CVP 4   PAP 20/13 CO 5.57 CI 2.7   Co-ox 82%  RHC 06/07/2017  RA 3 RV 49/5 PA 48/14, mean 27 PCWP mean 14 Oxygen saturations: PA 63% AO 95% Cardiac Output (Thermo) 4.0 Cardiac Index (Thermo) 2.0  PVR 3.25 WU   Objective:   Weight Range: 86.8 kg (191 lb 5.8 oz) Body mass index is 29.1 kg/m.   Vital Signs:   Temp:  [97.7 F (36.5 C)-100 F (37.8 C)] 99.9 F (37.7 C) (10/07 1137) Pulse Rate:  [71-100] 87 (10/07 1300) Resp:  [8-20] 14 (10/07 1300) BP: (96-140)/(48-85) 140/63 (10/07 1300) SpO2:  [96 %-100 %] 100 % (10/07 1300) Weight:  [86.8 kg (191 lb 5.8 oz)] 86.8 kg (191 lb 5.8 oz) (10/07 0500) Last BM Date: 06/06/17 (expresses, "im not constipated.  Im empty.  Havent ate")  Weight change: Filed Weights   06/09/17 0500 06/10/17 0500 06/11/17 0500  Weight: 90.4 kg (199 lb 4.7 oz) 88.6 kg (195 lb 5.2 oz) 86.8 kg (191 lb 5.8 oz)    Intake/Output:   Intake/Output Summary (Last 24 hours) at 06/11/17 1324 Last data filed at 06/11/17 1300  Gross per 24 hour  Intake           577.53 ml  Output             2220 ml  Net         -1642.47 ml      Physical Exam   General:  Sitting in chair  No resp difficulty HEENT: normal x for edentulous  Neck: supple.JVP 7-8.  RIJ introducer Carotids 2+ bilat; no bruits. No lymphadenopathy or thryomegaly appreciated. Cor: PMI laterally displaced. Regular rate & rhythm. No rubs, gallops or murmurs. Lungs: clear Abdomen: soft, nontender, nondistended. No hepatosplenomegaly. No bruits or masses. Good bowel sounds. Extremities: no cyanosis, clubbing, rash, edema Neuro: alert & orientedx3, cranial nerves grossly intact. moves all  4 extremities w/o difficulty. Affect pleasan   Telemetry   NSR 70-80 s. Occasional PVCs personally reviewed .    EKG    N/A  Labs    CBC  Recent Labs  06/10/17 0609 06/11/17 0636  WBC 10.1 11.2*  NEUTROABS 7.7 8.5*  HGB 9.7* 9.9*  HCT 30.0* 31.8*  MCV 82.9 82.2  PLT 192 622   Basic Metabolic Panel  Recent Labs  06/09/17 1632 06/10/17 0609 06/11/17 0636  NA  --  137 136  K  --  4.2 4.1  CL  --  103 100*  CO2  --  26 28  GLUCOSE  --  128* 136*  BUN  --  31* 27*  CREATININE  --  1.57* 1.68*  CALCIUM  --  8.6* 8.6*  MG 2.1 2.1  --    Liver Function Tests  Recent Labs  06/10/17 0609 06/11/17 0636  AST 54* 41  ALT 59 48  ALKPHOS 101 117  BILITOT 0.7 0.7  PROT 7.0 7.2  ALBUMIN 3.0* 3.1*   No results for input(s): LIPASE, AMYLASE in the last 72 hours. Cardiac Enzymes No results for input(s): CKTOTAL, CKMB, CKMBINDEX, TROPONINI in the last 72 hours.  BNP: BNP (last 3 results)  Recent Labs  02/08/17 1250 04/05/17 1510 06/07/17 2234  BNP 1,330.6* 2,214.8* 324.2*    ProBNP (last 3 results) No results for input(s): PROBNP in the last 8760 hours.   D-Dimer No results for input(s): DDIMER in the last 72 hours. Hemoglobin A1C No results for input(s): HGBA1C in the last 72 hours. Fasting Lipid Panel No results for input(s): CHOL, HDL, LDLCALC, TRIG, CHOLHDL, LDLDIRECT in the last 72 hours. Thyroid Function Tests No results for input(s): TSH, T4TOTAL, T3FREE, THYROIDAB in the last 72 hours.  Invalid input(s): FREET3  Other results:   Imaging    No results found.   Medications:     Scheduled Medications: . atorvastatin  80 mg Oral q1800  . bisacodyl  5 mg Oral Once  . carvedilol  3.125 mg Oral BID  . chlorhexidine  15 mL Mouth/Throat QID  . [START ON 06/12/2017] chlorhexidine  15 mL Mouth/Throat Once  . Chlorhexidine Gluconate Cloth  6 each Topical Daily  . Chlorhexidine Gluconate Cloth  6 each Topical Once   And  . Chlorhexidine  Gluconate Cloth  6 each Topical Once  . Chlorhexidine Gluconate Cloth  6 each Topical Once  . [START ON 06/12/2017] Chlorhexidine Gluconate Cloth  6 each Topical Once  . [START ON 06/12/2017] diazepam  2 mg Oral Once  . digoxin  0.125 mg Oral Daily  . gabapentin  300 mg Oral BID  . insulin aspart  0-15 Units Subcutaneous TID WC  . insulin aspart  0-5 Units Subcutaneous QHS  . insulin aspart  7 Units Subcutaneous Q supper  . insulin glargine  21 Units Subcutaneous Daily  . isosorbide-hydrALAZINE  1 tablet Oral TID  . magnesium oxide  400 mg Oral Daily  . [START ON 06/12/2017] magnesium sulfate  40 mEq Other To OR  . [START ON 06/12/2017] magnesium sulfate  40 mEq Other To OR  . metoCLOPramide  5 mg Oral TID AC  . mupirocin ointment  1 application Nasal BID  . pantoprazole  40 mg Oral Daily  . [START ON 06/12/2017] potassium chloride  80 mEq Other To OR  . [START ON 06/12/2017] potassium chloride  80 mEq Other To OR  . sodium chloride flush  3 mL Intravenous Q12H  . sodium chloride flush  3 mL Intravenous Q12H  . spironolactone  12.5 mg Oral Daily  . [START ON 06/12/2017] tranexamic acid  15 mg/kg Intravenous To OR  . [START ON 06/12/2017] tranexamic acid  15 mg/kg Intravenous To OR  . [START ON 06/12/2017] tranexamic acid  2 mg/kg Intracatheter To OR  . [START ON 06/12/2017] tranexamic acid  2 mg/kg Intracatheter To OR  . [START ON 06/12/2017] vancomycin  1,000 mg Other To OR  . [START ON 06/12/2017] vancomycin  1,000 mg Other To OR    Infusions: . sodium chloride 10 mL/hr at 06/08/17 0645  . sodium chloride 10 mL/hr at 06/11/17 1300  . sodium chloride    . [START ON 06/12/2017] cefUROXime (ZINACEF)  IV    . [START ON 06/12/2017] cefUROXime (ZINACEF)  IV    . [START ON 06/12/2017] cefUROXime (ZINACEF)  IV    . [START ON 06/12/2017] dexmedetomidine    . [START ON 06/12/2017] dexmedetomidine    . [START ON 06/12/2017] DOBUTamine    . [START ON 06/12/2017] DOBUTamine    . [START ON 06/12/2017]  DOPamine    . [START ON 06/12/2017] DOPamine    . [START ON 06/12/2017] epinephrine    . [START ON 06/12/2017]  epinephrine    . [START ON 06/12/2017] fluconazole (DIFLUCAN) IV    . [START ON 06/12/2017] heparin 30,000 units/NS 1000 mL solution for CELLSAVER    . [START ON 06/12/2017] heparin 30,000 units/NS 1000 mL solution for CELLSAVER    . heparin 1,450 Units/hr (06/11/17 1313)  . [START ON 06/12/2017] insulin (NOVOLIN-R) infusion    . [START ON 06/12/2017] insulin (NOVOLIN-R) infusion    . lactated ringers    . milrinone 0.125 mcg/kg/min (06/11/17 1300)  . [START ON 06/12/2017] milrinone    . [START ON 06/12/2017] milrinone    . [START ON 06/12/2017] nitroGLYCERIN    . [START ON 06/12/2017] nitroGLYCERIN    . [START ON 06/12/2017] norepinephrine (LEVOPHED) Adult infusion    . [START ON 06/12/2017] norepinephrine (LEVOPHED) Adult infusion    . [START ON 06/12/2017] phenylephrine 12m/250mL NS (0.019mml) infusion    . [START ON 06/12/2017] phenylephrine 2030m50mL NS (0.73m75m) infusion    . [START ON 06/12/2017] rifampin (RIFADIN) IVPB    . [START ON 06/12/2017] tranexamic acid (CYKLOKAPRON) infusion ([facility])    . [START ON 06/12/2017] tranexamic acid (CYKLOKAPRON) infusion ([facility])    . [START ON 06/12/2017] vancomycin    . [START ON 06/12/2017] vasopressin (PITRESSIN) infusion - *FOR SHOCK*    . [START ON 06/12/2017] vasopressin (PITRESSIN) infusion - *FOR SHOCK*      PRN Medications: sodium chloride, sodium chloride, acetaminophen, fentaNYL (SUBLIMAZE) injection, hydrALAZINE, ondansetron (ZOFRAN) IV, ondansetron, oxyCODONE-acetaminophen, sodium chloride flush, sodium chloride flush, temazepam    Patient Profile   51 y53with history of CAD s/p anterior MI in 2010 and chronic systolic CHF (ischemic cardiomyopathy) as well as CKD stage III is admittted today for hemodynamic optimization prior to LVAD placement.   Assessment/Plan   1. Chronic systolic CHF: Ischemic cardiomyopathy. St Jude ICD.  NSTEMI in March 2018 with DES to LAD and RCA, complicated by cardiogenic shock, low output requiring milrinone. Most recent echo 8/18 with EF 20-25%, moderate MR. NYHA class IIIb-IVsymptoms (fluctuate). I think he is nearing the need for LVAD placement. CPX in 8/18 showed a severe functional impairment from HF and creatinine is worsening, likely cardiorenal. NYHA class IIIb-IV, fluctuates. He has seen Dr. Van Prescott Gum still needs dental extractions.  He had RHC today showing optimized filling pressure and marginal cardiac index (2.0 by thermodilution).  SwanLuiz Blare left in place.  Creatinine has trended down from 2.1>1.65>1.57 with inotrope support .  No change today. Continue HF meds. - CVP stable in 7-8 range after one dose IV lasix yesterday.  - Co-ox stable on low-dose milrinone. Will continue  - Continue Bidil1 tabs tid - Continue low dose Coreg.  - Continue digoxin   - Can continue low dose spironolactone.  - Not on ACEI/ARB/ARNI with CKD.  - He is off Plavix for washout prior to LVAD (now > 6 month post-PCI).  - Continue heparin. D/w PharmD - Dr. Van Prescott Gum seen this am. For VAD tomorrow  2. Acute on CKD stage III: Suspect a cardiorenal component given worsening over the last few months though diabetic nephropathy likely plays a role. On admit Creatinine 2.09 but now 1.68.     - Continue low dose milrinone.  3. CAD: NSTEMI in 3/18. LHC with 99% ulcerated lesion proximal RCA with left to right collaterals, 95% mid LAD stenosis after mid LAD stent. s/p PCI to RCA and LAD on 11/25/16.  - No s/s ischemia  - Off plavix with planned LVAD tomorrow am. On heparin. hgb stable at  9.9 - Continue Coreg as above.  - Continue high intensity statin, good lipids recently.  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 Recent V/Q scan did not show acute or chronic PE.  He is a heterozygote for Factor V Leiden, plan for for ASA 325 and warfarin INR 2-2.5 when LVAD placed.  Will cover with  heparin gtt pre-op => heparin restarted 2200 on 10/4.   5. Chronic N/V/D with gastroparesis: Follows with GI. Quiescent currently.  6. DM: On Jardiance at home, will cover with sliding scale> DM coordinator has seen and increased insulin.   7. S/P Multiple Tooth Extractions 4-15, 19-23, and 27-29. Stable. No bleeding   For VAD tomorrow. Continue milrinone and heparin.    Length of Stay: 4  [name], MD  06/11/2017, 1:24 PM  Advanced Heart Failure Team Pager [phone] (M-F; 7a - 4p)  Please contact [facility] for night-coverage after hours (4p -7a ) and weekends on [email or web address]

## 2017-06-11 NOTE — Consult Note (Signed)
Anesthesiology Pre-operative Note:  51 year old male with end-stage ischemic cardiomyopathy scheduled for LVAD insertion in AM for destination therapy. Patient appears clinically stable on milrinone 0.15 mcg/kg/min  Problems:  1. Ischemic cardiomyopathy: AWMI 03/2004, NSTEMI 11/2016, DES to RCA and Mid-LAD. Plavix held 9/28, P2Y12- 352  10/5 (normal range) 2. LV dysfunction: LVEF 20-25% severe hypokinesis of mid- apical-anterolateral, inferolateral, and apical myocardium, Normal RV size and systolic function,  Mild TR, trivial AI, Mild MR by ECHO 04/06/17 3. Dooms placed 2010, generator replaced 09/14/16, never discharged 4. Factor V Leiden Deficiency H/O recurrent DVTs, chronic LEs DVTs by [facility] 04/28/17 was on coumadin now on IV heparin. V/Q scan (-) (last PT/INR 12.9/0.98 06/07/17) 5. Type 2 DM 30 years per patient 6. Chronic renal insufficiency baseline Cr. 1.6-1.7 7. H/O Crohn's Disease S/P R. Hemi-Colectomy  02/2002 8. Hypertension 9. Poor Dentition- S/P  Extractions of remaining teeth 06/08/17 presently no bleeding 10. PVD bilateral ABIs 0.5 11. Peripheral neuropathy- walks with cane or walker  VS: T- 37.6 BP 134/69 HR-88 RR 14 O2 Sat 99% on @L  CVP 6-8  HEENT- lips swollen, gums look OK no bleeding Malampati 3 Hear RRR  Lungs- clear   RHC 06/07/17: RA -3 RV -49/5 PA -48/14 PCWP- 14 CO/CI 4.7/2.3 COOX 63%  K-4/1 BUN/Cr.- 27/1.68 Na-136 H/H-11.2/31.8 WBC- 11,200 Platelets-213,000   Anesthetic plan, discussed with patient,mother and sister, all questions answered. I explained that can expect to remain intubated at least overnight following surgery.   Roberts Gaudy

## 2017-06-11 NOTE — Progress Notes (Signed)
Dr Prescott Gum informed that IV team was called in to start peripheral IV on pt. IV in placed but difficult to flush and no blood returned noted. Introducer still in place. MD states to keep introducer in place and that he will inform anesthesia team so that PICC line can be place on 06/12/17

## 2017-06-11 NOTE — Progress Notes (Addendum)
ANTICOAGULATION CONSULT NOTE - [place] for heparin Indication: DVT  Allergies  Allergen Reactions  . Metformin And Related Diarrhea    Patient Measurements: Height: 5' 8"  (172.7 cm) Weight: 191 lb 5.8 oz (86.8 kg) IBW/kg (Calculated) : 68.4 Heparin Dosing Weight: 85 kg  Vital Signs: Temp: 99.8 F (37.7 C) (10/07 0413) Temp Source: Axillary (10/07 0413) BP: 115/70 (10/07 0500) Pulse Rate: 100 (10/07 0600)  Labs:  Recent Labs  06/09/17 0516  06/10/17 0609 06/10/17 1200 06/10/17 1905 06/11/17 0636  HGB 9.0*  --  9.7*  --   --  9.9*  HCT 28.2*  --  30.0*  --   --  31.8*  PLT 157  --  192  --   --  213  LABPROT  --   --  13.0  --   --   --   INR  --   --  0.99  --   --   --   HEPARINUNFRC  --   < > <0.10* 0.29* 0.23* 0.40  CREATININE 1.65*  --  1.57*  --   --   --   < > = values in this interval not displayed.  Estimated Creatinine Clearance: 59 mL/min (A) (by C-G formula based on SCr of 1.57 mg/dL (H)).  Assessment: 83 yoM admitted for LVAD workup. Pt on warfarin PTA for recurrent DVTs now to transition to heparin per pharmacy. Pt s/p dental extractions 10/4 and planning for VAD placement 10/8. Heparin level therapeutic this morning at 0.40, CBC remains stable.  Goal of Therapy:  Heparin Level 0.4-0.5 Monitor platelets by anticoagulation protocol: Yes  Plan: -Continue heparin at 1350 units/hr -Check 6-hr confirmatory heparin level -Daily heparin level, CBC  ADDENDUM: Repeat heparin level therapeutic but below goal of 0.4-0.5 at 0.32.   Plan: -Increase heparin to 1450 units/hr -Check 6-hr heparin level  Arrie Senate, PharmD PGY-2 Cardiology Pharmacy Resident Pager: [phone] 06/11/2017

## 2017-06-11 NOTE — Plan of Care (Signed)
Problem: Activity: Goal: Ability to return to baseline activity level will improve Outcome: Progressing Walked x3   Problem: Cardiovascular: Goal: Ability to achieve and maintain adequate cardiovascular perfusion will improve Outcome: Progressing Within parameters  Problem: Education: Goal: Understanding of CV disease, CV risk reduction, and recovery process will improve Outcome: Progressing Education ongoing

## 2017-06-11 NOTE — Progress Notes (Signed)
ANTICOAGULATION CONSULT NOTE - [place] for heparin Indication: DVT  Allergies  Allergen Reactions  . Metformin And Related Diarrhea    Patient Measurements: Height: 5' 8"  (172.7 cm) Weight: 191 lb 5.8 oz (86.8 kg) IBW/kg (Calculated) : 68.4 Heparin Dosing Weight: 85 kg  Vital Signs: Temp: 100.6 F (38.1 C) (10/07 2122) Temp Source: Axillary (10/07 2122) BP: 110/49 (10/07 2005) Pulse Rate: 45 (10/07 2005)  Labs:  Recent Labs  06/09/17 0516  06/10/17 0609  06/11/17 0636 06/11/17 1136 06/11/17 1343 06/11/17 2010  HGB 9.0*  --  9.7*  --  9.9*  --   --   --   HCT 28.2*  --  30.0*  --  31.8*  --   --   --   PLT 157  --  192  --  213  --   --   --   APTT  --   --   --   --   --   --  58*  --   LABPROT  --   --  13.0  --   --   --   --   --   INR  --   --  0.99  --   --   --   --   --   HEPARINUNFRC  --   < > <0.10*  < > 0.40 0.36  --  0.20*  CREATININE 1.65*  --  1.57*  --  1.68*  --   --   --   < > = values in this interval not displayed.  Estimated Creatinine Clearance: 55.1 mL/min (A) (by C-G formula based on SCr of 1.68 mg/dL (H)).  Assessment: 33 yoM admitted for LVAD workup. Pt on warfarin PTA for recurrent DVTs now to transition to heparin per pharmacy. Pt s/p dental extractions 10/4 and planning for VAD placement 10/8. -Heparin level is 0.2 after increase to 1450 units/hr  Goal of Therapy:  Heparin Level 0.4-0.5 Monitor platelets by anticoagulation protocol: Yes  Plan: -Increase heparin to 1600 units/hr -Plans for VAD in am (heparin to be off at 4am)  Hildred Laser, Pharm D 06/11/2017 9:41 PM

## 2017-06-11 NOTE — Progress Notes (Signed)
3 Days Post-Op Procedure(s) (LRB): MULTIPLE EXTRACTION WITH ALVEOLOPLASTY AND PRE PROSTHETIC SURGERY AS NEEDED (N/A) Subjective: Walking in hallway Mouth feels better after extraction of 20 teeth Creatinine stable Low-grade temperature 100.0 Plan destination therapy LVAD implant tomorrow  Past history significant for chronic bilateral DVT with negative V/Q scan and factor V Leiden deficiency, granulomatous colitis status post hemicolectomy, peripheral neuropathy requiring cane or walker, peripheral vascular disease with bilateral ABI of 0.5, AICD implant without history of shock, chronic kidney disease baseline creatinine 1.7  Objective: Vital signs in last 24 hours: Temp:  [97.7 F (36.5 C)-100 F (37.8 C)] 100 F (37.8 C) (10/07 0733) Pulse Rate:  [71-100] 85 (10/07 1000) Cardiac Rhythm: Normal sinus rhythm (10/07 0400) Resp:  [8-28] 19 (10/07 1000) BP: (96-135)/(48-85) 121/65 (10/07 1000) SpO2:  [96 %-100 %] 99 % (10/07 1000) Weight:  [191 lb 5.8 oz (86.8 kg)] 191 lb 5.8 oz (86.8 kg) (10/07 0500)  Hemodynamic parameters for last 24 hours: CVP:  [6 mmHg-12 mmHg] 6 mmHg  Intake/Output from previous day: 10/06 0701 - 10/07 0700 In: 852.7 [P.O.:240; I.V.:612.7] Out: 2220 [Urine:2220] Intake/Output this shift: Total I/O In: 53.4 [I.V.:53.4] Out: -        Exam    General- alert and comfortable   Lungs- clear without rales, wheezes   Cor- regular rate and rhythm, no murmur , gallop   Abdomen- soft, non-tender   Extremities - warm, non-tender, minimal edema   Neuro- oriented, appropriate, no focal weakness   Lab Results:  Recent Labs  06/10/17 0609 06/11/17 0636  WBC 10.1 11.2*  HGB 9.7* 9.9*  HCT 30.0* 31.8*  PLT 192 213   BMET:  Recent Labs  06/10/17 0609 06/11/17 0636  NA 137 136  K 4.2 4.1  CL 103 100*  CO2 26 28  GLUCOSE 128* 136*  BUN 31* 27*  CREATININE 1.57* 1.68*  CALCIUM 8.6* 8.6*    PT/INR:  Recent Labs  06/10/17 0609  LABPROT 13.0   INR 0.99   ABG    Component Value Date/Time   PHART 7.437 04/05/2017 1706   HCO3 31.0 (H) 06/07/2017 1033   HCO3 29.6 (H) 06/07/2017 1033   TCO2 33 (H) 06/07/2017 1033   TCO2 31 06/07/2017 1033   ACIDBASEDEF 2.0 04/05/2017 1706   O2SAT 66.6 06/11/2017 0612   CBG (last 3)   Recent Labs  06/08/17 2150 06/09/17 0816 06/09/17 1140  GLUCAP 148* 155* 110*    Assessment/Plan: S/P Procedure(s) (LRB): MULTIPLE EXTRACTION WITH ALVEOLOPLASTY AND PRE PROSTHETIC SURGERY AS NEEDED (N/A) LVAD implantation a.m.   LOS: 4 days    [name] 06/11/2017

## 2017-06-12 ENCOUNTER — Encounter (HOSPITAL_COMMUNITY): Admission: RE | Admit: 2017-06-12 | Payer: BLUE CROSS/BLUE SHIELD | Source: Ambulatory Visit

## 2017-06-12 ENCOUNTER — Inpatient Hospital Stay (HOSPITAL_COMMUNITY)
Admission: AD | Disposition: A | Discharge status: Home Health | Payer: Self-pay | Source: Ambulatory Visit | Attending: Cardiology

## 2017-06-12 ENCOUNTER — Inpatient Hospital Stay (HOSPITAL_COMMUNITY): Payer: BLUE CROSS/BLUE SHIELD

## 2017-06-12 ENCOUNTER — Ambulatory Visit: Payer: BLUE CROSS/BLUE SHIELD | Admitting: Internal Medicine

## 2017-06-12 ENCOUNTER — Inpatient Hospital Stay (HOSPITAL_COMMUNITY): Payer: BLUE CROSS/BLUE SHIELD | Admitting: Certified Registered Nurse Anesthetist

## 2017-06-12 ENCOUNTER — Other Ambulatory Visit: Payer: Self-pay

## 2017-06-12 ENCOUNTER — Inpatient Hospital Stay (HOSPITAL_COMMUNITY)
Admission: RE | Admit: 2017-06-12 | Payer: BLUE CROSS/BLUE SHIELD | Source: Ambulatory Visit | Admitting: Cardiothoracic Surgery

## 2017-06-12 DIAGNOSIS — Z95811 Presence of heart assist device: Secondary | ICD-10-CM

## 2017-06-12 DIAGNOSIS — I255 Ischemic cardiomyopathy: Secondary | ICD-10-CM

## 2017-06-12 DIAGNOSIS — I5043 Acute on chronic combined systolic (congestive) and diastolic (congestive) heart failure: Secondary | ICD-10-CM

## 2017-06-12 HISTORY — PX: TEE WITHOUT CARDIOVERSION: SHX5443

## 2017-06-12 HISTORY — PX: INSERTION OF IMPLANTABLE LEFT VENTRICULAR ASSIST DEVICE: SHX5866

## 2017-06-12 LAB — APTT
aPTT: 37 seconds — ABNORMAL HIGH (ref 24–36)
aPTT: 40 seconds — ABNORMAL HIGH (ref 24–36)

## 2017-06-12 LAB — POCT I-STAT, CHEM 8
BUN: 23 mg/dL — ABNORMAL HIGH (ref 6–20)
BUN: 21 mg/dL — ABNORMAL HIGH (ref 6–20)
BUN: 19 mg/dL (ref 6–20)
BUN: 22 mg/dL — AB (ref 6–20)
BUN: 21 mg/dL — ABNORMAL HIGH (ref 6–20)
BUN: 19 mg/dL (ref 6–20)
BUN: 20 mg/dL (ref 6–20)
CALCIUM ION: 0.93 mmol/L — AB (ref 1.15–1.40)
CALCIUM ION: 1.14 mmol/L — AB (ref 1.15–1.40)
CALCIUM ION: 1.13 mmol/L — AB (ref 1.15–1.40)
CALCIUM ION: 1.14 mmol/L — AB (ref 1.15–1.40)
CHLORIDE: 98 mmol/L — AB (ref 101–111)
CHLORIDE: 100 mmol/L — AB (ref 101–111)
CHLORIDE: 98 mmol/L — AB (ref 101–111)
CHLORIDE: 99 mmol/L — AB (ref 101–111)
CHLORIDE: 100 mmol/L — AB (ref 101–111)
CREATININE: 1.3 mg/dL — AB (ref 0.61–1.24)
CREATININE: 1.2 mg/dL (ref 0.61–1.24)
Calcium, Ion: 1.18 mmol/L (ref 1.15–1.40)
Calcium, Ion: 1.12 mmol/L — ABNORMAL LOW (ref 1.15–1.40)
Calcium, Ion: 0.99 mmol/L — ABNORMAL LOW (ref 1.15–1.40)
Chloride: 101 mmol/L (ref 101–111)
Chloride: 100 mmol/L — ABNORMAL LOW (ref 101–111)
Creatinine, Ser: 1.3 mg/dL — ABNORMAL HIGH (ref 0.61–1.24)
Creatinine, Ser: 1.1 mg/dL (ref 0.61–1.24)
Creatinine, Ser: 1.1 mg/dL (ref 0.61–1.24)
Creatinine, Ser: 1.2 mg/dL (ref 0.61–1.24)
Creatinine, Ser: 1.4 mg/dL — ABNORMAL HIGH (ref 0.61–1.24)
GLUCOSE: 128 mg/dL — AB (ref 65–99)
GLUCOSE: 142 mg/dL — AB (ref 65–99)
GLUCOSE: 162 mg/dL — AB (ref 65–99)
GLUCOSE: 145 mg/dL — AB (ref 65–99)
GLUCOSE: 99 mg/dL (ref 65–99)
Glucose, Bld: 119 mg/dL — ABNORMAL HIGH (ref 65–99)
Glucose, Bld: 149 mg/dL — ABNORMAL HIGH (ref 65–99)
HCT: 26 % — ABNORMAL LOW (ref 39.0–52.0)
HCT: 23 % — ABNORMAL LOW (ref 39.0–52.0)
HCT: 26 % — ABNORMAL LOW (ref 39.0–52.0)
HCT: 30 % — ABNORMAL LOW (ref 39.0–52.0)
HCT: 27 % — ABNORMAL LOW (ref 39.0–52.0)
HCT: 30 % — ABNORMAL LOW (ref 39.0–52.0)
HEMATOCRIT: 28 % — AB (ref 39.0–52.0)
HEMOGLOBIN: 9.5 g/dL — AB (ref 13.0–17.0)
HEMOGLOBIN: 10.2 g/dL — AB (ref 13.0–17.0)
HEMOGLOBIN: 9.2 g/dL — AB (ref 13.0–17.0)
Hemoglobin: 8.8 g/dL — ABNORMAL LOW (ref 13.0–17.0)
Hemoglobin: 7.8 g/dL — ABNORMAL LOW (ref 13.0–17.0)
Hemoglobin: 8.8 g/dL — ABNORMAL LOW (ref 13.0–17.0)
Hemoglobin: 10.2 g/dL — ABNORMAL LOW (ref 13.0–17.0)
POTASSIUM: 3.5 mmol/L (ref 3.5–5.1)
POTASSIUM: 3.3 mmol/L — AB (ref 3.5–5.1)
POTASSIUM: 3.5 mmol/L (ref 3.5–5.1)
Potassium: 3.5 mmol/L (ref 3.5–5.1)
Potassium: 3.1 mmol/L — ABNORMAL LOW (ref 3.5–5.1)
Potassium: 4 mmol/L (ref 3.5–5.1)
Potassium: 3.8 mmol/L (ref 3.5–5.1)
SODIUM: 136 mmol/L (ref 135–145)
Sodium: 137 mmol/L (ref 135–145)
Sodium: 137 mmol/L (ref 135–145)
Sodium: 137 mmol/L (ref 135–145)
Sodium: 138 mmol/L (ref 135–145)
Sodium: 139 mmol/L (ref 135–145)
Sodium: 137 mmol/L (ref 135–145)
TCO2: 29 mmol/L (ref 22–32)
TCO2: 27 mmol/L (ref 22–32)
TCO2: 29 mmol/L (ref 22–32)
TCO2: 29 mmol/L (ref 22–32)
TCO2: 27 mmol/L (ref 22–32)
TCO2: 25 mmol/L (ref 22–32)
TCO2: 25 mmol/L (ref 22–32)

## 2017-06-12 LAB — POCT I-STAT 3, ART BLOOD GAS (G3+)
ACID-BASE EXCESS: 3 mmol/L — AB (ref 0.0–2.0)
ACID-BASE EXCESS: 4 mmol/L — AB (ref 0.0–2.0)
ACID-BASE EXCESS: 2 mmol/L (ref 0.0–2.0)
Acid-Base Excess: 5 mmol/L — ABNORMAL HIGH (ref 0.0–2.0)
Acid-Base Excess: 4 mmol/L — ABNORMAL HIGH (ref 0.0–2.0)
Acid-Base Excess: 1 mmol/L (ref 0.0–2.0)
Acid-Base Excess: 1 mmol/L (ref 0.0–2.0)
BICARBONATE: 29 mmol/L — AB (ref 20.0–28.0)
BICARBONATE: 28.9 mmol/L — AB (ref 20.0–28.0)
BICARBONATE: 27.7 mmol/L (ref 20.0–28.0)
BICARBONATE: 25.4 mmol/L (ref 20.0–28.0)
Bicarbonate: 28.2 mmol/L — ABNORMAL HIGH (ref 20.0–28.0)
Bicarbonate: 24.4 mmol/L (ref 20.0–28.0)
Bicarbonate: 24.6 mmol/L (ref 20.0–28.0)
O2 SAT: 100 %
O2 SAT: 99 %
O2 SAT: 100 %
O2 SAT: 100 %
O2 SAT: 100 %
O2 Saturation: 98 %
O2 Saturation: 100 %
PCO2 ART: 36.5 mmHg (ref 32.0–48.0)
PCO2 ART: 34.3 mmHg (ref 32.0–48.0)
PCO2 ART: 31.2 mmHg — AB (ref 32.0–48.0)
PCO2 ART: 33 mmHg (ref 32.0–48.0)
PH ART: 7.506 — AB (ref 7.350–7.450)
PH ART: 7.472 — AB (ref 7.350–7.450)
PO2 ART: 108 mmHg (ref 83.0–108.0)
PO2 ART: 174 mmHg — AB (ref 83.0–108.0)
Patient temperature: 36
Patient temperature: 35.9
Patient temperature: 35.9
TCO2: 31 mmol/L (ref 22–32)
TCO2: 30 mmol/L (ref 22–32)
TCO2: 29 mmol/L (ref 22–32)
TCO2: 29 mmol/L (ref 22–32)
TCO2: 26 mmol/L (ref 22–32)
TCO2: 25 mmol/L (ref 22–32)
TCO2: 26 mmol/L (ref 22–32)
pCO2 arterial: 50.5 mmHg — ABNORMAL HIGH (ref 32.0–48.0)
pCO2 arterial: 41.5 mmHg (ref 32.0–48.0)
pCO2 arterial: 33 mmHg (ref 32.0–48.0)
pH, Arterial: 7.367 (ref 7.350–7.450)
pH, Arterial: 7.44 (ref 7.350–7.450)
pH, Arterial: 7.512 — ABNORMAL HIGH (ref 7.350–7.450)
pH, Arterial: 7.515 — ABNORMAL HIGH (ref 7.350–7.450)
pH, Arterial: 7.477 — ABNORMAL HIGH (ref 7.350–7.450)
pO2, Arterial: 114 mmHg — ABNORMAL HIGH (ref 83.0–108.0)
pO2, Arterial: 379 mmHg — ABNORMAL HIGH (ref 83.0–108.0)
pO2, Arterial: 272 mmHg — ABNORMAL HIGH (ref 83.0–108.0)
pO2, Arterial: 176 mmHg — ABNORMAL HIGH (ref 83.0–108.0)
pO2, Arterial: 198 mmHg — ABNORMAL HIGH (ref 83.0–108.0)

## 2017-06-12 LAB — ECHO TEE
LV dias vol index: 73 mL/m2
LV dias vol: 147 mL (ref 62–150)
LV sys vol index: 43 mL/m2
LV sys vol: 86 mL — AB
LVOT area: 2.01 cm2
LVOT diameter: 16 mm
Simpson's disk: 41
Stroke v: 61 mL

## 2017-06-12 LAB — PREPARE RBC (CROSSMATCH)

## 2017-06-12 LAB — CBC WITH DIFFERENTIAL/PLATELET
Basophils Absolute: 0 K/uL (ref 0.0–0.1)
Basophils Relative: 0 %
Eosinophils Absolute: 0.4 K/uL (ref 0.0–0.7)
Eosinophils Relative: 4 %
HCT: 27.5 % — ABNORMAL LOW (ref 39.0–52.0)
Hemoglobin: 8.5 g/dL — ABNORMAL LOW (ref 13.0–17.0)
Lymphocytes Relative: 21 %
Lymphs Abs: 2.1 K/uL (ref 0.7–4.0)
MCH: 25 pg — ABNORMAL LOW (ref 26.0–34.0)
MCHC: 30.9 g/dL (ref 30.0–36.0)
MCV: 80.9 fL (ref 78.0–100.0)
Monocytes Absolute: 0.8 K/uL (ref 0.1–1.0)
Monocytes Relative: 8 %
Neutro Abs: 6.6 K/uL (ref 1.7–7.7)
Neutrophils Relative %: 67 %
Platelets: 188 K/uL (ref 150–400)
RBC: 3.4 MIL/uL — ABNORMAL LOW (ref 4.22–5.81)
RDW: 14.8 % (ref 11.5–15.5)
WBC: 9.9 K/uL (ref 4.0–10.5)

## 2017-06-12 LAB — COOXEMETRY PANEL
CARBOXYHEMOGLOBIN: 1 % (ref 0.5–1.5)
CARBOXYHEMOGLOBIN: 1.4 % (ref 0.5–1.5)
Carboxyhemoglobin: 0.8 % (ref 0.5–1.5)
Carboxyhemoglobin: 0.8 % (ref 0.5–1.5)
METHEMOGLOBIN: 2 % — AB (ref 0.0–1.5)
Methemoglobin: 1.7 % — ABNORMAL HIGH (ref 0.0–1.5)
Methemoglobin: 1.6 % — ABNORMAL HIGH (ref 0.0–1.5)
Methemoglobin: 2 % — ABNORMAL HIGH (ref 0.0–1.5)
O2 SAT: 63.3 %
O2 Saturation: 67.7 %
O2 Saturation: 62.3 %
O2 Saturation: 63.1 %
TOTAL HEMOGLOBIN: 9.1 g/dL — AB (ref 12.0–16.0)
Total hemoglobin: 8.7 g/dL — ABNORMAL LOW (ref 12.0–16.0)
Total hemoglobin: 8.9 g/dL — ABNORMAL LOW (ref 12.0–16.0)
Total hemoglobin: 9 g/dL — ABNORMAL LOW (ref 12.0–16.0)

## 2017-06-12 LAB — GLUCOSE, CAPILLARY
GLUCOSE-CAPILLARY: 166 mg/dL — AB (ref 65–99)
GLUCOSE-CAPILLARY: 114 mg/dL — AB (ref 65–99)
GLUCOSE-CAPILLARY: 89 mg/dL (ref 65–99)
GLUCOSE-CAPILLARY: 91 mg/dL (ref 65–99)
GLUCOSE-CAPILLARY: 149 mg/dL — AB (ref 65–99)
Glucose-Capillary: 129 mg/dL — ABNORMAL HIGH (ref 65–99)
Glucose-Capillary: 153 mg/dL — ABNORMAL HIGH (ref 65–99)
Glucose-Capillary: 132 mg/dL — ABNORMAL HIGH (ref 65–99)
Glucose-Capillary: 100 mg/dL — ABNORMAL HIGH (ref 65–99)
Glucose-Capillary: 168 mg/dL — ABNORMAL HIGH (ref 65–99)
Glucose-Capillary: 105 mg/dL — ABNORMAL HIGH (ref 65–99)
Glucose-Capillary: 141 mg/dL — ABNORMAL HIGH (ref 65–99)
Glucose-Capillary: 185 mg/dL — ABNORMAL HIGH (ref 65–99)
Glucose-Capillary: 135 mg/dL — ABNORMAL HIGH (ref 65–99)
Glucose-Capillary: 63 mg/dL — ABNORMAL LOW (ref 65–99)
Glucose-Capillary: 84 mg/dL (ref 65–99)
Glucose-Capillary: 151 mg/dL — ABNORMAL HIGH (ref 65–99)
Glucose-Capillary: 129 mg/dL — ABNORMAL HIGH (ref 65–99)
Glucose-Capillary: 121 mg/dL — ABNORMAL HIGH (ref 65–99)
Glucose-Capillary: 103 mg/dL — ABNORMAL HIGH (ref 65–99)

## 2017-06-12 LAB — POCT I-STAT EG7
ACID-BASE EXCESS: 1 mmol/L (ref 0.0–2.0)
Bicarbonate: 25.8 mmol/L (ref 20.0–28.0)
Calcium, Ion: 1.04 mmol/L — ABNORMAL LOW (ref 1.15–1.40)
HEMATOCRIT: 28 % — AB (ref 39.0–52.0)
HEMOGLOBIN: 9.5 g/dL — AB (ref 13.0–17.0)
O2 Saturation: 72 %
POTASSIUM: 3 mmol/L — AB (ref 3.5–5.1)
Patient temperature: 36.6
SODIUM: 142 mmol/L (ref 135–145)
TCO2: 27 mmol/L (ref 22–32)
pCO2, Ven: 38.2 mmHg — ABNORMAL LOW (ref 44.0–60.0)
pH, Ven: 7.436 — ABNORMAL HIGH (ref 7.250–7.430)
pO2, Ven: 36 mmHg (ref 32.0–45.0)

## 2017-06-12 LAB — CBC
HCT: 29.1 % — ABNORMAL LOW (ref 39.0–52.0)
HCT: 28 % — ABNORMAL LOW (ref 39.0–52.0)
Hemoglobin: 9.5 g/dL — ABNORMAL LOW (ref 13.0–17.0)
Hemoglobin: 9.1 g/dL — ABNORMAL LOW (ref 13.0–17.0)
MCH: 26.2 pg (ref 26.0–34.0)
MCH: 25.9 pg — ABNORMAL LOW (ref 26.0–34.0)
MCHC: 32.6 g/dL (ref 30.0–36.0)
MCHC: 32.5 g/dL (ref 30.0–36.0)
MCV: 80.2 fL (ref 78.0–100.0)
MCV: 79.8 fL (ref 78.0–100.0)
Platelets: 111 10*3/uL — ABNORMAL LOW (ref 150–400)
Platelets: 124 10*3/uL — ABNORMAL LOW (ref 150–400)
RBC: 3.63 MIL/uL — ABNORMAL LOW (ref 4.22–5.81)
RBC: 3.51 MIL/uL — ABNORMAL LOW (ref 4.22–5.81)
RDW: 14.4 % (ref 11.5–15.5)
RDW: 14.5 % (ref 11.5–15.5)
WBC: 17.7 10*3/uL — ABNORMAL HIGH (ref 4.0–10.5)
WBC: 17 10*3/uL — ABNORMAL HIGH (ref 4.0–10.5)

## 2017-06-12 LAB — DIC (DISSEMINATED INTRAVASCULAR COAGULATION)PANEL
D-Dimer, Quant: 1.21 ug/mL-FEU — ABNORMAL HIGH (ref 0.00–0.50)
Fibrinogen: 353 mg/dL (ref 210–475)
INR: 1.36
Platelets: 111 10*3/uL — ABNORMAL LOW (ref 150–400)
Prothrombin Time: 16.7 seconds — ABNORMAL HIGH (ref 11.4–15.2)
Smear Review: NONE SEEN
aPTT: 39 seconds — ABNORMAL HIGH (ref 24–36)

## 2017-06-12 LAB — CREATININE, SERUM
Creatinine, Ser: 1.52 mg/dL — ABNORMAL HIGH (ref 0.61–1.24)
GFR calc Af Amer: 59 mL/min — ABNORMAL LOW (ref >60)
GFR calc non Af Amer: 51 mL/min — ABNORMAL LOW (ref >60)

## 2017-06-12 LAB — PROTIME-INR
INR: 1.44
INR: 1.28
PROTHROMBIN TIME: 15.9 s — AB (ref 11.4–15.2)
Prothrombin Time: 17.5 seconds — ABNORMAL HIGH (ref 11.4–15.2)

## 2017-06-12 LAB — COMPREHENSIVE METABOLIC PANEL WITH GFR
ALT: 36 U/L (ref 17–63)
AST: 31 U/L (ref 15–41)
Albumin: 2.6 g/dL — ABNORMAL LOW (ref 3.5–5.0)
Alkaline Phosphatase: 98 U/L (ref 38–126)
Anion gap: 7 (ref 5–15)
BUN: 25 mg/dL — ABNORMAL HIGH (ref 6–20)
CO2: 27 mmol/L (ref 22–32)
Calcium: 8.1 mg/dL — ABNORMAL LOW (ref 8.9–10.3)
Chloride: 99 mmol/L — ABNORMAL LOW (ref 101–111)
Creatinine, Ser: 1.54 mg/dL — ABNORMAL HIGH (ref 0.61–1.24)
GFR calc Af Amer: 58 mL/min — ABNORMAL LOW
GFR calc non Af Amer: 50 mL/min — ABNORMAL LOW
Glucose, Bld: 118 mg/dL — ABNORMAL HIGH (ref 65–99)
Potassium: 3.6 mmol/L (ref 3.5–5.1)
Sodium: 133 mmol/L — ABNORMAL LOW (ref 135–145)
Total Bilirubin: 0.5 mg/dL (ref 0.3–1.2)
Total Protein: 6 g/dL — ABNORMAL LOW (ref 6.5–8.1)

## 2017-06-12 LAB — POCT I-STAT 4, (NA,K, GLUC, HGB,HCT)
Glucose, Bld: 111 mg/dL — ABNORMAL HIGH (ref 65–99)
HEMATOCRIT: 28 % — AB (ref 39.0–52.0)
Hemoglobin: 9.5 g/dL — ABNORMAL LOW (ref 13.0–17.0)
Potassium: 3.1 mmol/L — ABNORMAL LOW (ref 3.5–5.1)
SODIUM: 139 mmol/L (ref 135–145)

## 2017-06-12 LAB — BASIC METABOLIC PANEL
Anion gap: 8 (ref 5–15)
BUN: 18 mg/dL (ref 6–20)
CO2: 22 mmol/L (ref 22–32)
Calcium: 8 mg/dL — ABNORMAL LOW (ref 8.9–10.3)
Chloride: 104 mmol/L (ref 101–111)
Creatinine, Ser: 1.34 mg/dL — ABNORMAL HIGH (ref 0.61–1.24)
GFR calc Af Amer: >60 mL/min (ref >60)
GFR calc non Af Amer: 59 mL/min — ABNORMAL LOW (ref >60)
Glucose, Bld: 141 mg/dL — ABNORMAL HIGH (ref 65–99)
Potassium: 3.9 mmol/L (ref 3.5–5.1)
Sodium: 134 mmol/L — ABNORMAL LOW (ref 135–145)

## 2017-06-12 LAB — HEPARIN LEVEL (UNFRACTIONATED): HEPARIN UNFRACTIONATED: 0.63 [IU]/mL (ref 0.30–0.70)

## 2017-06-12 LAB — MAGNESIUM
Magnesium: 2.9 mg/dL — ABNORMAL HIGH (ref 1.7–2.4)
Magnesium: 2.7 mg/dL — ABNORMAL HIGH (ref 1.7–2.4)

## 2017-06-12 LAB — FIBRINOGEN: Fibrinogen: 341 mg/dL (ref 210–475)

## 2017-06-12 LAB — HEMOGLOBIN AND HEMATOCRIT, BLOOD
HCT: 21.3 % — ABNORMAL LOW (ref 39.0–52.0)
Hemoglobin: 7 g/dL — ABNORMAL LOW (ref 13.0–17.0)

## 2017-06-12 LAB — PLATELET COUNT: Platelets: 107 10*3/uL — ABNORMAL LOW (ref 150–400)

## 2017-06-12 SURGERY — INSERTION OF IMPLANTABLE LEFT VENTRICULAR ASSIST DEVICE
Anesthesia: General | Site: Chest

## 2017-06-12 MED ORDER — PROTAMINE SULFATE 10 MG/ML IV SOLN
INTRAVENOUS | Status: DC | PRN
  Administered 2017-06-12: 20 mg via INTRAVENOUS
  Administered 2017-06-12 (×5): 50 mg via INTRAVENOUS
  Administered 2017-06-12: 30 mg via INTRAVENOUS

## 2017-06-12 MED ORDER — ALBUMIN HUMAN 5 % IV SOLN
250.0000 mL | INTRAVENOUS | Status: AC | PRN
  Administered 2017-06-12 (×4): 250 mL via INTRAVENOUS
  Filled 2017-06-12 (×2): qty 250

## 2017-06-12 MED ORDER — EPINEPHRINE PF 1 MG/ML IJ SOLN
0.0000 ug/min | INTRAVENOUS | Status: DC
  Administered 2017-06-13 – 2017-06-14 (×3): 3 ug/min via INTRAVENOUS
  Filled 2017-06-12 (×3): qty 4

## 2017-06-12 MED ORDER — ACETAMINOPHEN 500 MG PO TABS
1000.0000 mg | ORAL_TABLET | Freq: Four times a day (QID) | ORAL | Status: AC
  Administered 2017-06-13 – 2017-06-17 (×7): 1000 mg via ORAL
  Filled 2017-06-12 (×7): qty 2

## 2017-06-12 MED ORDER — LIDOCAINE HCL (CARDIAC) 20 MG/ML IV SOLN
100.0000 mg | Freq: Once | INTRAVENOUS | Status: AC
  Administered 2017-06-12: 100 mg via INTRAVENOUS

## 2017-06-12 MED ORDER — ASPIRIN 300 MG RE SUPP: 300.0000 mg | Freq: Every day | RECTAL | Status: DC

## 2017-06-12 MED ORDER — CHLORHEXIDINE GLUCONATE CLOTH 2 % EX PADS
6.0000 | MEDICATED_PAD | Freq: Every day | CUTANEOUS | Status: DC
  Administered 2017-06-12 – 2017-06-28 (×17): 6 via TOPICAL

## 2017-06-12 MED ORDER — SODIUM CHLORIDE 0.9 % IV SOLN
600.0000 mg | INTRAVENOUS | Status: DC
  Filled 2017-06-12: qty 600

## 2017-06-12 MED ORDER — ASPIRIN 81 MG PO CHEW: 324.0000 mg | CHEWABLE_TABLET | Freq: Every day | ORAL | Status: DC

## 2017-06-12 MED ORDER — ASPIRIN EC 325 MG PO TBEC
325.0000 mg | DELAYED_RELEASE_TABLET | Freq: Every day | ORAL | Status: DC
  Administered 2017-06-13 – 2017-06-19 (×7): 325 mg via ORAL
  Filled 2017-06-12 (×7): qty 1

## 2017-06-12 MED ORDER — VANCOMYCIN HCL 1000 MG IV SOLR
INTRAVENOUS | Status: AC
  Filled 2017-06-12: qty 1000

## 2017-06-12 MED ORDER — FENTANYL CITRATE (PF) 250 MCG/5ML IJ SOLN
INTRAMUSCULAR | Status: DC | PRN
  Administered 2017-06-12: 100 ug via INTRAVENOUS
  Administered 2017-06-12: 50 ug via INTRAVENOUS
  Administered 2017-06-12: 250 ug via INTRAVENOUS
  Administered 2017-06-12: 200 ug via INTRAVENOUS
  Administered 2017-06-12 (×2): 150 ug via INTRAVENOUS

## 2017-06-12 MED ORDER — ONDANSETRON HCL 4 MG/2ML IJ SOLN
4.0000 mg | Freq: Four times a day (QID) | INTRAMUSCULAR | Status: DC | PRN
  Administered 2017-06-13 – 2017-07-03 (×16): 4 mg via INTRAVENOUS
  Filled 2017-06-12 (×17): qty 2

## 2017-06-12 MED ORDER — FUROSEMIDE 10 MG/ML IJ SOLN
10.0000 mg | Freq: Once | INTRAMUSCULAR | Status: AC
  Administered 2017-06-12: 10 mg via INTRAVENOUS

## 2017-06-12 MED ORDER — RIFAMPIN 300 MG PO CAPS
600.0000 mg | ORAL_CAPSULE | Freq: Once | ORAL | Status: AC
  Administered 2017-06-13: 600 mg via ORAL
  Filled 2017-06-12: qty 2

## 2017-06-12 MED ORDER — LACTATED RINGERS IV SOLN
INTRAVENOUS | Status: DC | PRN
  Administered 2017-06-12: 08:00:00 via INTRAVENOUS

## 2017-06-12 MED ORDER — LIDOCAINE HCL (CARDIAC) 10 MG/ML IV SOLN
100.0000 mg | Freq: Once | INTRAVENOUS | Status: AC
  Filled 2017-06-12: qty 10

## 2017-06-12 MED ORDER — SODIUM CHLORIDE 0.9 % IV SOLN
600.0000 mg | Freq: Once | INTRAVENOUS | Status: AC
  Administered 2017-06-12: 600 mg via INTRAVENOUS
  Filled 2017-06-12: qty 600

## 2017-06-12 MED ORDER — AMIODARONE HCL IN DEXTROSE 360-4.14 MG/200ML-% IV SOLN
60.0000 mg/h | INTRAVENOUS | Status: AC
  Administered 2017-06-12 (×2): 60 mg/h via INTRAVENOUS
  Filled 2017-06-12 (×3): qty 200

## 2017-06-12 MED ORDER — AMIODARONE HCL IN DEXTROSE 360-4.14 MG/200ML-% IV SOLN
30.0000 mg/h | INTRAVENOUS | Status: DC
  Administered 2017-06-12 – 2017-06-15 (×8): 30 mg/h via INTRAVENOUS
  Administered 2017-06-16 (×2): 60 mg/h via INTRAVENOUS
  Administered 2017-06-17: 30 mg/h via INTRAVENOUS
  Administered 2017-06-17: 60 mg/h via INTRAVENOUS
  Filled 2017-06-12 (×14): qty 200

## 2017-06-12 MED ORDER — SODIUM CHLORIDE 0.45 % IV SOLN: INTRAVENOUS | Status: DC | PRN

## 2017-06-12 MED ORDER — 0.9 % SODIUM CHLORIDE (POUR BTL) OPTIME
TOPICAL | Status: DC | PRN
  Administered 2017-06-12: 5000 mL

## 2017-06-12 MED ORDER — OXYCODONE HCL 5 MG PO TABS
5.0000 mg | ORAL_TABLET | ORAL | Status: DC | PRN
  Administered 2017-06-13 – 2017-06-15 (×4): 10 mg via ORAL
  Administered 2017-06-17 (×2): 5 mg via ORAL
  Filled 2017-06-12 (×2): qty 2
  Filled 2017-06-12: qty 1
  Filled 2017-06-12 (×3): qty 2
  Filled 2017-06-12: qty 1
  Filled 2017-06-12: qty 2

## 2017-06-12 MED ORDER — CHLORHEXIDINE GLUCONATE 0.12% ORAL RINSE (MEDLINE KIT)
15.0000 mL | Freq: Two times a day (BID) | OROMUCOSAL | Status: DC
  Administered 2017-06-12 – 2017-06-13 (×2): 15 mL via OROMUCOSAL

## 2017-06-12 MED ORDER — VANCOMYCIN HCL 1000 MG IV SOLR
INTRAVENOUS | Status: DC | PRN
  Administered 2017-06-12: 1000 mg

## 2017-06-12 MED ORDER — SODIUM CHLORIDE 0.9% FLUSH
3.0000 mL | Freq: Two times a day (BID) | INTRAVENOUS | Status: DC
  Administered 2017-06-17 – 2017-06-22 (×9): 3 mL via INTRAVENOUS
  Administered 2017-06-26: 10 mL via INTRAVENOUS
  Administered 2017-06-27 – 2017-07-02 (×6): 3 mL via INTRAVENOUS

## 2017-06-12 MED ORDER — GELATIN ABSORBABLE MT POWD
OROMUCOSAL | Status: DC | PRN
  Administered 2017-06-12 (×4): via TOPICAL

## 2017-06-12 MED ORDER — MIDAZOLAM HCL 5 MG/5ML IJ SOLN
INTRAMUSCULAR | Status: DC | PRN
  Administered 2017-06-12: 3 mg via INTRAVENOUS
  Administered 2017-06-12: 1 mg via INTRAVENOUS
  Administered 2017-06-12: 4 mg via INTRAVENOUS
  Administered 2017-06-12: 2 mg via INTRAVENOUS

## 2017-06-12 MED ORDER — BISACODYL 10 MG RE SUPP
10.0000 mg | Freq: Every day | RECTAL | Status: DC
  Filled 2017-06-12: qty 1

## 2017-06-12 MED ORDER — VANCOMYCIN HCL IN DEXTROSE 1-5 GM/200ML-% IV SOLN
1000.0000 mg | Freq: Three times a day (TID) | INTRAVENOUS | Status: DC
  Administered 2017-06-12 – 2017-06-14 (×5): 1000 mg via INTRAVENOUS
  Filled 2017-06-12 (×6): qty 200

## 2017-06-12 MED ORDER — SODIUM CHLORIDE 0.9 % IV SOLN
250.0000 mL | INTRAVENOUS | Status: DC
  Administered 2017-06-13: 250 mL via INTRAVENOUS

## 2017-06-12 MED ORDER — HEMOSTATIC AGENTS (NO CHARGE) OPTIME
TOPICAL | Status: DC | PRN
  Administered 2017-06-12: 1 via TOPICAL

## 2017-06-12 MED ORDER — LACTATED RINGERS IV SOLN: INTRAVENOUS | Status: DC

## 2017-06-12 MED ORDER — ACETAMINOPHEN 650 MG RE SUPP
650.0000 mg | Freq: Once | RECTAL | Status: AC
  Administered 2017-06-12: 650 mg via RECTAL

## 2017-06-12 MED ORDER — POTASSIUM CHLORIDE 10 MEQ/50ML IV SOLN
10.0000 meq | INTRAVENOUS | Status: AC
  Administered 2017-06-12 (×3): 10 meq via INTRAVENOUS
  Filled 2017-06-12 (×2): qty 50

## 2017-06-12 MED ORDER — ACETAMINOPHEN 160 MG/5ML PO SOLN: 650.0000 mg | Freq: Once | ORAL | Status: AC

## 2017-06-12 MED ORDER — ORAL CARE MOUTH RINSE
15.0000 mL | Freq: Four times a day (QID) | OROMUCOSAL | Status: DC
  Administered 2017-06-12 – 2017-06-13 (×3): 15 mL via OROMUCOSAL

## 2017-06-12 MED ORDER — ACETAMINOPHEN 160 MG/5ML PO SOLN
1000.0000 mg | Freq: Four times a day (QID) | ORAL | Status: AC
  Administered 2017-06-13 (×2): 1000 mg
  Filled 2017-06-12 (×2): qty 40.6

## 2017-06-12 MED ORDER — PANTOPRAZOLE SODIUM 40 MG PO TBEC
40.0000 mg | DELAYED_RELEASE_TABLET | Freq: Every day | ORAL | Status: DC
  Administered 2017-06-14 – 2017-07-03 (×20): 40 mg via ORAL
  Filled 2017-06-12 (×21): qty 1

## 2017-06-12 MED ORDER — SODIUM CHLORIDE 0.9% FLUSH: 3.0000 mL | INTRAVENOUS | Status: DC | PRN

## 2017-06-12 MED ORDER — MORPHINE SULFATE (PF) 4 MG/ML IV SOLN
2.0000 mg | INTRAVENOUS | Status: DC | PRN
  Administered 2017-06-12: 2 mg via INTRAVENOUS
  Administered 2017-06-13 (×4): 4 mg via INTRAVENOUS
  Administered 2017-06-13: 2 mg via INTRAVENOUS
  Administered 2017-06-13 – 2017-06-16 (×4): 4 mg via INTRAVENOUS
  Filled 2017-06-12 (×9): qty 1

## 2017-06-12 MED ORDER — SODIUM CHLORIDE 0.9 % IV SOLN
INTRAVENOUS | Status: DC
  Administered 2017-06-12: 3.6 [IU]/h via INTRAVENOUS
  Filled 2017-06-12: qty 1

## 2017-06-12 MED ORDER — NITROGLYCERIN IN D5W 200-5 MCG/ML-% IV SOLN
7.0000 ug/min | INTRAVENOUS | Status: DC
  Administered 2017-06-12: 16.6 ug/min via INTRAVENOUS
  Filled 2017-06-12: qty 250

## 2017-06-12 MED ORDER — MAGNESIUM SULFATE 4 GM/100ML IV SOLN
4.0000 g | Freq: Once | INTRAVENOUS | Status: AC
  Administered 2017-06-12: 4 g via INTRAVENOUS
  Filled 2017-06-12: qty 100

## 2017-06-12 MED ORDER — MUPIROCIN 2 % EX OINT
1.0000 "application " | TOPICAL_OINTMENT | Freq: Two times a day (BID) | CUTANEOUS | Status: AC
  Administered 2017-06-12 (×2): 1 via NASAL
  Filled 2017-06-12: qty 22

## 2017-06-12 MED ORDER — DOCUSATE SODIUM 100 MG PO CAPS
200.0000 mg | ORAL_CAPSULE | Freq: Every day | ORAL | Status: DC
  Administered 2017-06-13 – 2017-06-29 (×8): 200 mg via ORAL
  Filled 2017-06-12 (×11): qty 2

## 2017-06-12 MED ORDER — ROCURONIUM BROMIDE 100 MG/10ML IV SOLN
INTRAVENOUS | Status: DC | PRN
  Administered 2017-06-12 (×3): 50 mg via INTRAVENOUS

## 2017-06-12 MED ORDER — SODIUM CHLORIDE 0.9% FLUSH
10.0000 mL | Freq: Two times a day (BID) | INTRAVENOUS | Status: DC
  Administered 2017-06-14 – 2017-06-20 (×9): 10 mL
  Administered 2017-06-21: 30 mL
  Administered 2017-06-21 – 2017-06-22 (×2): 10 mL
  Administered 2017-06-22: 20 mL
  Administered 2017-06-23 – 2017-06-27 (×4): 10 mL

## 2017-06-12 MED ORDER — FLUCONAZOLE IN SODIUM CHLORIDE 400-0.9 MG/200ML-% IV SOLN
400.0000 mg | Freq: Once | INTRAVENOUS | Status: AC
  Administered 2017-06-13: 400 mg via INTRAVENOUS
  Filled 2017-06-12: qty 200

## 2017-06-12 MED ORDER — POTASSIUM CHLORIDE 10 MEQ/50ML IV SOLN
10.0000 meq | Freq: Once | INTRAVENOUS | Status: AC
  Administered 2017-06-12: 10 meq via INTRAVENOUS

## 2017-06-12 MED ORDER — INSULIN REGULAR BOLUS VIA INFUSION
0.0000 [IU] | Freq: Three times a day (TID) | INTRAVENOUS | Status: DC
  Filled 2017-06-12: qty 10

## 2017-06-12 MED ORDER — AMIODARONE IV BOLUS ONLY 150 MG/100ML
150.0000 mg | Freq: Once | INTRAVENOUS | Status: AC
  Administered 2017-06-12: 150 mg via INTRAVENOUS

## 2017-06-12 MED ORDER — HEPARIN SODIUM (PORCINE) 1000 UNIT/ML IJ SOLN
INTRAMUSCULAR | Status: DC | PRN
  Administered 2017-06-12: 31000 [IU] via INTRAVENOUS

## 2017-06-12 MED ORDER — MIDAZOLAM HCL 2 MG/2ML IJ SOLN
2.0000 mg | INTRAMUSCULAR | Status: DC | PRN
  Administered 2017-06-13: 2 mg via INTRAVENOUS
  Filled 2017-06-12 (×2): qty 2

## 2017-06-12 MED ORDER — NOREPINEPHRINE BITARTRATE 1 MG/ML IV SOLN
0.0000 ug/min | INTRAVENOUS | Status: DC
  Administered 2017-06-14: 4 ug/min via INTRAVENOUS
  Filled 2017-06-12: qty 4

## 2017-06-12 MED ORDER — VANCOMYCIN HCL IN DEXTROSE 1-5 GM/200ML-% IV SOLN: 1000.0000 mg | Freq: Two times a day (BID) | INTRAVENOUS | Status: DC

## 2017-06-12 MED ORDER — DEXMEDETOMIDINE HCL IN NACL 400 MCG/100ML IV SOLN
0.1000 ug/kg/h | INTRAVENOUS | Status: AC
  Filled 2017-06-12: qty 100

## 2017-06-12 MED ORDER — TRAMADOL HCL 50 MG PO TABS: 50.0000 mg | ORAL_TABLET | ORAL | Status: DC | PRN

## 2017-06-12 MED ORDER — CEFUROXIME SODIUM 1.5 G IV SOLR
1.5000 g | Freq: Two times a day (BID) | INTRAVENOUS | Status: DC
  Administered 2017-06-13 – 2017-06-14 (×3): 1.5 g via INTRAVENOUS
  Filled 2017-06-12 (×4): qty 1.5

## 2017-06-12 MED ORDER — AMIODARONE LOAD VIA INFUSION
150.0000 mg | Freq: Once | INTRAVENOUS | Status: AC
  Administered 2017-06-12: 150 mg via INTRAVENOUS
  Filled 2017-06-12: qty 83.34

## 2017-06-12 MED ORDER — FAMOTIDINE IN NACL 20-0.9 MG/50ML-% IV SOLN
20.0000 mg | Freq: Two times a day (BID) | INTRAVENOUS | Status: AC
  Administered 2017-06-12 – 2017-06-13 (×2): 20 mg via INTRAVENOUS
  Filled 2017-06-12: qty 50

## 2017-06-12 MED ORDER — LIDOCAINE HCL (PF) 1 % IJ SOLN
INTRAMUSCULAR | Status: AC
  Filled 2017-06-12: qty 30

## 2017-06-12 MED ORDER — CHLORHEXIDINE GLUCONATE 0.12 % MT SOLN
15.0000 mL | OROMUCOSAL | Status: AC
  Administered 2017-06-12: 15 mL via OROMUCOSAL

## 2017-06-12 MED ORDER — BISACODYL 5 MG PO TBEC
10.0000 mg | DELAYED_RELEASE_TABLET | Freq: Every day | ORAL | Status: DC
  Administered 2017-06-13 – 2017-06-29 (×7): 10 mg via ORAL
  Filled 2017-06-12 (×10): qty 2

## 2017-06-12 MED ORDER — ETOMIDATE 2 MG/ML IV SOLN
INTRAVENOUS | Status: DC | PRN
  Administered 2017-06-12: 16 mg via INTRAVENOUS

## 2017-06-12 MED ORDER — VANCOMYCIN HCL 10 G IV SOLR: 1500.0000 mg | INTRAVENOUS | Status: DC

## 2017-06-12 MED ORDER — SODIUM CHLORIDE 0.9 % IV SOLN
INTRAVENOUS | Status: DC
  Administered 2017-06-17 – 2017-06-20 (×2): via INTRAVENOUS

## 2017-06-12 MED ORDER — MORPHINE SULFATE (PF) 4 MG/ML IV SOLN
1.0000 mg | INTRAVENOUS | Status: DC | PRN
  Administered 2017-06-12 (×2): 2 mg via INTRAVENOUS
  Administered 2017-06-13: 3 mg via INTRAVENOUS
  Filled 2017-06-12 (×4): qty 1

## 2017-06-12 MED ORDER — SODIUM CHLORIDE 0.9% FLUSH
10.0000 mL | INTRAVENOUS | Status: DC | PRN
  Administered 2017-06-23: 10 mL
  Filled 2017-06-12: qty 40

## 2017-06-12 MED ORDER — METOCLOPRAMIDE HCL 5 MG/ML IJ SOLN
10.0000 mg | Freq: Four times a day (QID) | INTRAMUSCULAR | Status: DC
  Administered 2017-06-12 – 2017-06-21 (×31): 10 mg via INTRAVENOUS
  Filled 2017-06-12 (×29): qty 2

## 2017-06-12 MED ORDER — DEXMEDETOMIDINE HCL IN NACL 400 MCG/100ML IV SOLN
0.1000 ug/kg/h | INTRAVENOUS | Status: DC
  Administered 2017-06-12 – 2017-06-13 (×3): 0.7 ug/kg/h via INTRAVENOUS
  Filled 2017-06-12 (×2): qty 100

## 2017-06-12 MED ORDER — FLUCONAZOLE IN SODIUM CHLORIDE 400-0.9 MG/200ML-% IV SOLN
400.0000 mg | INTRAVENOUS | Status: DC
  Filled 2017-06-12: qty 200

## 2017-06-12 MED ORDER — DEXTROSE 5 % IV SOLN
1.5000 g | INTRAVENOUS | Status: DC
  Filled 2017-06-12: qty 1.5

## 2017-06-12 MED ORDER — MILRINONE LACTATE IN DEXTROSE 20-5 MG/100ML-% IV SOLN
0.3750 ug/kg/min | INTRAVENOUS | Status: DC
  Administered 2017-06-12: 0.375 ug/kg/min via INTRAVENOUS
  Administered 2017-06-12: 0.3 ug/kg/min via INTRAVENOUS
  Administered 2017-06-13: 0.375 ug/kg/min via INTRAVENOUS
  Filled 2017-06-12 (×2): qty 100

## 2017-06-12 MED FILL — Mannitol IV Soln 20%: INTRAVENOUS | Qty: 500 | Status: AC

## 2017-06-12 MED FILL — Sodium Bicarbonate IV Soln 8.4%: INTRAVENOUS | Qty: 50 | Status: AC

## 2017-06-12 MED FILL — Calcium Chloride Inj 10%: INTRAVENOUS | Qty: 10 | Status: AC

## 2017-06-12 MED FILL — Heparin Sodium (Porcine) Inj 1000 Unit/ML: INTRAMUSCULAR | Qty: 10 | Status: AC

## 2017-06-12 MED FILL — Sodium Chloride IV Soln 0.9%: INTRAVENOUS | Qty: 2000 | Status: AC

## 2017-06-12 MED FILL — Electrolyte-R (PH 7.4) Solution: INTRAVENOUS | Qty: 3000 | Status: AC

## 2017-06-12 SURGICAL SUPPLY — 109 items
ADAPTER CARDIO PERF ANTE/RETRO (ADAPTER) ×2 IMPLANT
ADAPTER DLP PERFUSION .25INX2I (MISCELLANEOUS) ×4 IMPLANT
ADPR CRDPLG .25X.64 STRL (MISCELLANEOUS) ×2
ADPR PRFSN 84XANTGRD RTRGD (ADAPTER) ×2
ANTEGRADE CPLG (MISCELLANEOUS) IMPLANT
APL SRG 7X2 LUM MLBL SLNT (VASCULAR PRODUCTS) ×2
APPLICATOR TIP COSEAL (VASCULAR PRODUCTS) ×2 IMPLANT
ATTRACTOMAT 16X20 MAGNETIC DRP (DRAPES) ×4 IMPLANT
BAG DECANTER FOR FLEXI CONT (MISCELLANEOUS) ×12 IMPLANT
BLADE STERNUM SYSTEM 6 (BLADE) ×4 IMPLANT
BLADE SURG 11 STRL SS (BLADE) ×2 IMPLANT
BLADE SURG 12 STRL SS (BLADE) ×4 IMPLANT
BLADE SURG 15 STRL LF DISP TIS (BLADE) IMPLANT
BLADE SURG 15 STRL SS (BLADE)
CANISTER SUCT 3000ML PPV (MISCELLANEOUS) ×4 IMPLANT
CANNULA ARTERIAL NVNT 3/8 20FR (MISCELLANEOUS) ×4 IMPLANT
CANNULA SUMP PERICARDIAL (CANNULA) ×2 IMPLANT
CANNULA VENOUS LOW PROF 34X46 (CANNULA) ×4 IMPLANT
CATH CPB KIT VANTRIGT (MISCELLANEOUS) IMPLANT
CATH FOLEY 2WAY SLVR  5CC 14FR (CATHETERS) ×2
CATH FOLEY 2WAY SLVR 5CC 14FR (CATHETERS) ×2 IMPLANT
CATH HYDRAGLIDE XL THORACIC (CATHETERS) ×4 IMPLANT
CATH ROBINSON RED A/P 18FR (CATHETERS) ×8 IMPLANT
CATH THORACIC 28FR (CATHETERS) IMPLANT
CATH THORACIC 36FR RT ANG (CATHETERS) IMPLANT
CHLORAPREP W/TINT 26ML (MISCELLANEOUS) ×4 IMPLANT
CONN ST 1/4X3/8  BEN (MISCELLANEOUS) ×2
CONN ST 1/4X3/8 BEN (MISCELLANEOUS) IMPLANT
CONT SPEC 4OZ CLIKSEAL STRL BL (MISCELLANEOUS) ×2 IMPLANT
CRADLE DONUT ADULT HEAD (MISCELLANEOUS) ×4 IMPLANT
DRAIN CHANNEL 28F RND 3/8 FF (WOUND CARE) IMPLANT
DRAIN CHANNEL 32F RND 10.7 FF (WOUND CARE) IMPLANT
DRAPE BILATERAL SPLIT (DRAPES) ×4 IMPLANT
DRAPE CV SPLIT W-CLR ANES SCRN (DRAPES) ×4 IMPLANT
DRAPE INCISE IOBAN 66X45 STRL (DRAPES) ×4 IMPLANT
DRAPE SLUSH/WARMER DISC (DRAPES) ×4 IMPLANT
DRSG AQUACEL AG ADV 3.5X14 (GAUZE/BANDAGES/DRESSINGS) ×4 IMPLANT
ELECT BLADE 4.0 EZ CLEAN MEGAD (MISCELLANEOUS) ×4
ELECT BLADE 6.5 EXT (BLADE) ×4 IMPLANT
ELECT CAUTERY BLADE 6.4 (BLADE) ×4 IMPLANT
ELECT REM PT RETURN 9FT ADLT (ELECTROSURGICAL) ×4
ELECTRODE BLDE 4.0 EZ CLN MEGD (MISCELLANEOUS) ×2 IMPLANT
ELECTRODE REM PT RTRN 9FT ADLT (ELECTROSURGICAL) ×2 IMPLANT
GAUZE SPONGE 4X4 12PLY STRL LF (GAUZE/BANDAGES/DRESSINGS) ×2 IMPLANT
GLOVE BIO SURGEON STRL SZ 6.5 (GLOVE) ×6 IMPLANT
GLOVE BIO SURGEON STRL SZ7.5 (GLOVE) ×12 IMPLANT
GLOVE BIO SURGEONS STRL SZ 6.5 (GLOVE) ×6
GOWN STRL REUS W/ TWL LRG LVL3 (GOWN DISPOSABLE) ×8 IMPLANT
GOWN STRL REUS W/TWL LRG LVL3 (GOWN DISPOSABLE) ×32
HEMOSTAT POWDER SURGIFOAM 1G (HEMOSTASIS) ×16 IMPLANT
HEMOSTAT SURGICEL 2X14 (HEMOSTASIS) IMPLANT
INSERT FOGARTY XLG (MISCELLANEOUS) IMPLANT
KIT BASIN OR (CUSTOM PROCEDURE TRAY) ×4 IMPLANT
KIT LVAD HEARTMATE 3 W-CNTRL (Prosthesis & Implant Heart) IMPLANT
KIT LVAD HEARTMATE III W-CNTRL (Prosthesis & Implant Heart) ×2 IMPLANT
KIT ROOM TURNOVER OR (KITS) ×4 IMPLANT
KIT SUCTION CATH 14FR (SUCTIONS) ×4 IMPLANT
LEAD PACING MYOCARDI (MISCELLANEOUS) IMPLANT
NEEDLE AORTIC AIR ASPIRATING (NEEDLE) ×2 IMPLANT
NS IRRIG 1000ML POUR BTL (IV SOLUTION) ×22 IMPLANT
PACK OPEN HEART (CUSTOM PROCEDURE TRAY) ×4 IMPLANT
PAD ARMBOARD 7.5X6 YLW CONV (MISCELLANEOUS) ×8 IMPLANT
PAD DEFIB R2 (MISCELLANEOUS) ×4 IMPLANT
PATCH GORETEX 10X15 (Vascular Products) ×2 IMPLANT
PUNCH AORTIC ROTATE 4.5MM 8IN (MISCELLANEOUS) ×4 IMPLANT
SEALANT SURG COSEAL 8ML (VASCULAR PRODUCTS) ×4 IMPLANT
SHEATH AVANTI 11CM 5FR (MISCELLANEOUS) IMPLANT
STOPCOCK 4 WAY LG BORE MALE ST (IV SETS) ×4 IMPLANT
SUCKER INTRACARDIAC WEIGHTED (SUCKER) ×4 IMPLANT
SUT ETHIBOND 2 0 SH (SUTURE) ×20
SUT ETHIBOND 2 0 SH 36X2 (SUTURE) ×10 IMPLANT
SUT ETHIBOND 5 LR DA (SUTURE) ×6 IMPLANT
SUT ETHIBOND NAB MH 2-0 36IN (SUTURE) ×52 IMPLANT
SUT ETHILON 3 0 FSL (SUTURE) ×2 IMPLANT
SUT PROLENE 3 0 SH DA (SUTURE) ×6 IMPLANT
SUT PROLENE 4 0 RB 1 (SUTURE) ×20
SUT PROLENE 4-0 RB1 .5 CRCL 36 (SUTURE) ×8 IMPLANT
SUT PROLENE 5 0 C 1 36 (SUTURE) ×2 IMPLANT
SUT PROLENE 5 0 C1 (SUTURE) ×4 IMPLANT
SUT PROLENE 6 0 C 1 30 (SUTURE) ×2 IMPLANT
SUT SILK  1 MH (SUTURE) ×8
SUT SILK 1 MH (SUTURE) ×8 IMPLANT
SUT SILK 1 TIES 10X30 (SUTURE) ×4 IMPLANT
SUT SILK 2 0 SH CR/8 (SUTURE) ×8 IMPLANT
SUT SILK 2 0 TIES 10X30 (SUTURE) ×2 IMPLANT
SUT SILK 3 0 SH CR/8 (SUTURE) ×4 IMPLANT
SUT SILK 4 0 TIE 10X30 (SUTURE) ×2 IMPLANT
SUT STEEL 6MS V (SUTURE) ×8 IMPLANT
SUT STEEL SZ 6 DBL 3X14 BALL (SUTURE) ×4 IMPLANT
SUT TEM PAC WIRE 2 0 SH (SUTURE) IMPLANT
SUT VIC AB 1 CTX 18 (SUTURE) ×2 IMPLANT
SUT VIC AB 1 CTX 36 (SUTURE) ×8
SUT VIC AB 1 CTX36XBRD ANBCTR (SUTURE) ×4 IMPLANT
SUT VIC AB 2-0 CTX 27 (SUTURE) ×8 IMPLANT
SUT VIC AB 3-0 SH 8-18 (SUTURE) ×2 IMPLANT
SUT VIC AB 3-0 X1 27 (SUTURE) ×8 IMPLANT
SUT VICRYL 2 TP 1 (SUTURE) IMPLANT
SYR 50ML LL SCALE MARK (SYRINGE) ×4 IMPLANT
SYSTEM SAHARA CHEST DRAIN ATS (WOUND CARE) ×4 IMPLANT
TAPE CLOTH SURG 4X10 WHT LF (GAUZE/BANDAGES/DRESSINGS) ×2 IMPLANT
TAPE PAPER 2X10 WHT MICROPORE (GAUZE/BANDAGES/DRESSINGS) ×2 IMPLANT
TRAY CATH LUMEN 1 20CM STRL (SET/KITS/TRAYS/PACK) ×4 IMPLANT
TRAY FOLEY SILVER 14FR TEMP (SET/KITS/TRAYS/PACK) ×4 IMPLANT
TUBE CONNECTING 12'X1/4 (SUCTIONS)
TUBE CONNECTING 12X1/4 (SUCTIONS) ×2 IMPLANT
TUBE SUCT INTRACARD DLP 20F (MISCELLANEOUS) ×2 IMPLANT
UNDERPAD 30X30 (UNDERPADS AND DIAPERS) ×4 IMPLANT
WATER STERILE IRR 1000ML POUR (IV SOLUTION) ×8 IMPLANT
YANKAUER SUCT BULB TIP NO VENT (SUCTIONS) ×4 IMPLANT

## 2017-06-12 NOTE — Progress Notes (Signed)
Unable to draw blood from introducer for cooxemetry panel. Multiple attempts made. Will continue to monitor closely. Eleonore Chiquito Rn 2 Heart

## 2017-06-12 NOTE — Progress Notes (Signed)
Started NO in the OR on patient at 20 ppm. Tanks PSI 1900

## 2017-06-12 NOTE — Transfer of Care (Signed)
Immediate Anesthesia Transfer of Care Note  Patient: [name]  Procedure(s) Performed: INSERTION OF IMPLANTABLE LEFT VENTRICULAR ASSIST DEVICE-HEARTMATE 3 (N/A Chest) TRANSESOPHAGEAL ECHOCARDIOGRAM (TEE) (N/A )  Patient Location: SICU  Anesthesia Type:General  Level of Consciousness: sedated and Patient remains intubated per anesthesia plan  Airway & Oxygen Therapy: Patient remains intubated per anesthesia plan and Patient placed on Ventilator (see vital sign flow sheet for setting)  Post-op Assessment: Report given to RN and Post -op Vital signs reviewed and stable  Post vital signs: Reviewed and stable  Last Vitals:  Vitals:   06/12/17 0505 06/12/17 0600  BP: 121/77 102/72  Pulse: 78 75  Resp: 15 19  Temp:    SpO2: 97% 97%    Last Pain:  Vitals:   06/12/17 0400  TempSrc: Axillary  PainSc:       Patients Stated Pain Goal: 3 (30/16/01 0932)  Complications: No apparent anesthesia complications

## 2017-06-12 NOTE — Progress Notes (Signed)
CSW met with patient's family in the waiting room to provide supportive intervention on day of implant. Patient's daughter [name], patient's mother and daughter's Aunt present in the room. CSW provided support and assisted with needs during the day. CSW will follow throughout LVAD implant hospitalization. Jackie , LCSW, CCSW-MCS [phone]   

## 2017-06-12 NOTE — Anesthesia Procedure Notes (Signed)
Procedure Name: Intubation Date/Time: 06/12/2017 8:04 AM Performed by: Linna Caprice, DAVID Pre-anesthesia Checklist: Patient identified, Emergency Drugs available, Suction available, Patient being monitored and Timeout performed Patient Re-evaluated:Patient Re-evaluated prior to induction Oxygen Delivery Method: Circle system utilized Preoxygenation: Pre-oxygenation with 100% oxygen Induction Type: IV induction Ventilation: Two handed mask ventilation required and Oral airway inserted - appropriate to patient size Laryngoscope Size: Mac and 4 Grade View: Grade II Tube type: Subglottic suction tube Tube size: 8.0 mm Number of attempts: 1 Airway Equipment and Method: Stylet Placement Confirmation: ETT inserted through vocal cords under direct vision,  positive ETCO2 and breath sounds checked- equal and bilateral Secured at: 22 cm Tube secured with: Tape Dental Injury: Teeth and Oropharynx as per pre-operative assessment  Comments: Intubation performed by Lerry Paterson, SRNA

## 2017-06-12 NOTE — Progress Notes (Addendum)
LVAD Coordinator Rounding Note:  Admitted 06/07/2017 for hemodynamic optimization prior to LVAD placement. Full dental extractions prior to LVAD implant.  HM3 LVAD implated on 06/12/2017 by Dr Prescott Gum under Destination Therapy criteria.  Vital signs: HR: 94 Doppler Pressure:not performed at this time Art Line BP: 100/80 (91) O2 Sat: 100% on 100% Fi02 NO: 20 PPM Wt:194 lbs     LVAD interrogation reveals:   PPGFQ:4210 Flow: 3.2 Power:  4 PI:2.4  Alarms: none Events:  none Hematocrit: 24 Fixed speed: 5400 Low speed limit: 5100  Drive Line: Daily. Change per protocol using daily kits and silver strip.   Labs:  LDH trend:255  INR trend: 1.44  Anticoagulation Plan: -INR Goal: 2-2.5, warfarin on hold at this time -ASA Dose: 343m  Blood Products:  - 2 units FFP in OR - 1 unit Platelets in OR  Gtts: - Amiodarone at 60 mg/hr -Precedex - Epinephrine 2 mcg/min - Insulin - Milrinone 0.375 mcg/kg/min - Levophed currently off  Device: -St Jude  -Therapies: off. Perm pacer set at 80 bpm.   Arrythmias: none [facility] Respiratory: currently intubated. 100% Fi02. NO at 20 PPM.  Renal:  -BUN/CRT: 21/1.10   Adverse Events on VAD: -none  Plan/Recommendations:   1. Please change drive-line dressing daily using daily dressing kits and silver strips. 2. Please page VAD pager with any equipment concerns.  LBalinda QuailsRN, VAD Coordinator 24/7 pager [phone]

## 2017-06-12 NOTE — Progress Notes (Signed)
Patient ID: [name], male   DOB: March 12, 1966, 51 y.o.   MRN: [id number]   Advanced Heart Failure VAD Team Note  Subjective:    Admitted 10/3 for RHC/Swan implantation and optimization prior to LVAD.  He was started on milrinone 0.125 and had multiple teeth removed.  Creatinine improved with milrinone.   HeartMate 3 VAD implanted 10/8.  No early surgical complications.   He is off norepinephrine, remains on epinephrine at 2, milrinone turned up to 0.3 mcg/kg/min with CI 1.6.   Frequent ventricular ectopy, amiodarone begun.   LVAD INTERROGATION:  HeartMate 3:  Flow 3 liters/min, speed 5400 rpm, power 3.6, PI 2.3.    Objective:    Vital Signs:   Temp:  [96.6 F (35.9 C)-100.6 F (38.1 C)] 96.6 F (35.9 C) (10/08 1515) Pulse Rate:  [33-92] 58 (10/08 1515) Resp:  [12-22] 12 (10/08 1515) BP: (85-142)/(49-84) 91/82 (10/08 1515) SpO2:  [92 %-100 %] 100 % (10/08 1515) FiO2 (%):  [50 %] 50 % (10/08 1300) Weight:  [194 lb 3.6 oz (88.1 kg)] 194 lb 3.6 oz (88.1 kg) (10/08 0414) Last BM Date: 06/11/17 Mean arterial Pressure 90  Intake/Output:   Intake/Output Summary (Last 24 hours) at 06/12/17 1527 Last data filed at 06/12/17 1500  Gross per 24 hour  Intake          4745.59 ml  Output             2420 ml  Net          2325.59 ml     Physical Exam    General:  Intubated/sedated.  HEENT: normal Neck: supple. JVP 8-9 cm. No lymphadenopathy or thyromegaly appreciated. Cor: Mechanical heart sounds with LVAD hum present. Lungs: Clear anteriorly.  Abdomen: soft, nondistended. No hepatosplenomegaly. No bruits or masses. Good bowel sounds. Driveline: C/D/I; securement device intact and driveline incorporated Extremities: no cyanosis, clubbing, rash, edema Neuro: alert & orientedx3, cranial nerves grossly intact. moves all 4 extremities w/o difficulty. Affect pleasant  Telemetry   A-paced at 90 bpm, personally reviewed.    Labs   Basic Metabolic Panel:  Recent  Labs Lab 06/08/17 0419 06/09/17 4782 06/09/17 1632 06/10/17 9562 06/11/17 0636 06/12/17 0210 06/12/17 0815 06/12/17 0910 06/12/17 0933 06/12/17 1014 06/12/17 1135 06/12/17 1155  NA 142 137  --  137 136 133* 137 137 137 138 139 142  K 3.2* 4.4  --  4.2 4.1 3.6 3.5 3.3* 3.5 3.5 3.1* 3.0*  CL 105 103  --  103 100* 99* 98* 100* 99* 98* 100*  --   CO2 29 26  --  26 28 27   --   --   --   --   --   --   GLUCOSE 84 167*  --  128* 136* 118* 119* 128* 142* 162* 145*  --   BUN 37* 35*  --  31* 27* 25* 23* 21* 19 22* 21*  --   CREATININE 1.55* 1.65*  --  1.57* 1.68* 1.54* 1.30* 1.30* 1.20 1.10 1.10  --   CALCIUM 8.4* 8.5*  --  8.6* 8.6* 8.1*  --   --   --   --   --   --   MG  --   --  2.1 2.1  --   --   --   --   --   --   --   --     Liver Function Tests:  Recent Labs Lab 06/08/17 0419 06/09/17 0516 06/10/17  5364 06/11/17 0636 06/12/17 0210  AST 61* 83* 54* 41 31  ALT 43 65* 59 48 36  ALKPHOS 113 101 101 117 98  BILITOT 0.7 0.5 0.7 0.7 0.5  PROT 6.4* 6.3* 7.0 7.2 6.0*  ALBUMIN 2.8* 2.7* 3.0* 3.1* 2.6*   No results for input(s): LIPASE, AMYLASE in the last 168 hours. No results for input(s): AMMONIA in the last 168 hours.  CBC:  Recent Labs Lab 06/08/17 0419 06/09/17 0516 06/10/17 0609 06/11/17 0636 06/12/17 0210  06/12/17 1014 06/12/17 1026 06/12/17 1135 06/12/17 1155 06/12/17 1234 06/12/17 1251  WBC 7.4 11.6* 10.1 11.2* 9.9  --   --   --   --   --   --  17.7*  NEUTROABS 5.1 9.6* 7.7 8.5* 6.6  --   --   --   --   --   --   --   HGB 9.9* 9.0* 9.7* 9.9* 8.5*  < > 7.8* 7.0* 10.2* 9.5*  --  9.5*  HCT 30.9* 28.2* 30.0* 31.8* 27.5*  < > 23.0* 21.3* 30.0* 28.0*  --  29.1*  MCV 80.9 82.0 82.9 82.2 80.9  --   --   --   --   --   --  80.2  PLT 207 157 192 213 188  --   --  107*  --   --  111* 111*  < > = values in this interval not displayed.  INR:  Recent Labs Lab 06/07/17 0736 06/10/17 0609 06/12/17 1234 06/12/17 1251  INR 0.98 0.99 1.36 1.44    Other  results:  EKG:    Imaging   Dg Chest Port 1 View  Result Date: 06/12/2017 CLINICAL DATA:  Left ventricular assist device insertion. Acute on chronic heart failure. EXAM: PORTABLE CHEST 1 VIEW COMPARISON:  06/09/2017 FINDINGS: Endotracheal tube and NG tube and chest tubes appear in good position. AICD in place. Swan-Ganz catheter in the right main pulmonary artery. Left ventricular assist device in place. Heart size is normal. Slight pulmonary vascular congestion on the left. No pneumothorax. IMPRESSION: 1. Tubes in position as described. 2. No pneumothorax. 3. Slight pulmonary vascular congestion on the left. Electronically Signed   By: Lorriane Shire M.D.   On: 06/12/2017 13:11      Medications:     Scheduled Medications: . [START ON 06/13/2017] acetaminophen  1,000 mg Oral Q6H   Or  . [START ON 06/13/2017] acetaminophen (TYLENOL) oral liquid 160 mg/5 mL  1,000 mg Per Tube Q6H  . [START ON 06/13/2017] aspirin EC  325 mg Oral Daily   Or  . [START ON 06/13/2017] aspirin  324 mg Per Tube Daily   Or  . [START ON 06/13/2017] aspirin  300 mg Rectal Daily  . [START ON 06/13/2017] bisacodyl  10 mg Oral Daily   Or  . [START ON 06/13/2017] bisacodyl  10 mg Rectal Daily  . digoxin  0.125 mg Oral Daily  . [START ON 06/13/2017] docusate sodium  200 mg Oral Daily  . insulin regular  0-10 Units Intravenous TID WC  . metoCLOPramide (REGLAN) injection  10 mg Intravenous Q6H  . mupirocin ointment  1 application Nasal BID  . [START ON 06/14/2017] pantoprazole  40 mg Oral Daily  . [START ON 06/13/2017] rifampin  600 mg Oral Once  . [START ON 06/13/2017] sodium chloride flush  3 mL Intravenous Q12H     Infusions: . sodium chloride 20 mL/hr at 06/12/17 1500  . [START ON 06/13/2017] sodium chloride    .  sodium chloride 10 mL/hr at 06/12/17 1500  . albumin human    . amiodarone 60 mg/hr (06/12/17 1500)  . amiodarone    . [START ON 06/13/2017] cefUROXime (ZINACEF)  IV    . dexmedetomidine (PRECEDEX) IV  infusion 0.7 mcg/kg/hr (06/12/17 1500)  . EPINEPHrine 4 mg in dextrose 5% 250 mL infusion (16 mcg/mL) 2 mcg/min (06/12/17 1500)  . famotidine (PEPCID) IV Stopped (06/12/17 1444)  . [START ON 06/13/2017] fluconazole (DIFLUCAN) IV    . insulin (NOVOLIN-R) infusion    . lactated ringers 20 mL/hr at 06/12/17 1500  . lactated ringers 20 mL/hr at 06/12/17 1500  . magnesium sulfate 4 g (06/12/17 1421)  . milrinone 0.3 mcg/kg/min (06/12/17 1500)  . nitroGLYCERIN Stopped (06/12/17 1345)  . norepinephrine (LEVOPHED) Adult infusion Stopped (06/12/17 1500)  . potassium chloride Stopped (06/12/17 1544)  . rifampin (RIFADIN) IVPB    . vancomycin 1,000 mg (06/12/17 1520)     PRN Medications:  sodium chloride, albumin human, midazolam, morphine injection, morphine injection, ondansetron (ZOFRAN) IV, oxyCODONE, [START ON 06/13/2017] sodium chloride flush, traMADol   Patient Profile   51 yo with history of CAD s/p anterior MI in 2010 and chronic systolic CHF (ischemic cardiomyopathy) as well as CKD stage III now s/p HeartMate 3 VAD placement.   Assessment/Plan:    1. Acute on chronic systolic CHF: Ischemic cardiomyopathy. St Jude ICD. NSTEMI in March 2018 with DES to LAD and RCA, complicated by cardiogenic shock, low output requiring milrinone. Most recent echo 8/18 with EF 20-25%, moderate MR. NYHA class IIIb-IVsymptoms (fluctuate).He was admitted and started on milrinone with improved creatinine.  HeartMate 3 VAD placement on 10/8. Stable post-op, currently on milrinone 0.3 (turned up post-op with low CI) and epinephrine 2.  MAP 90.  - Send co-ox on higher milrinone.  - Continue current milrinone and epinephrine.   - Will eventually need warfarin with INR goal 2-3 (aim slightly higher with h/o Factor V Leiden heterozygosity) and ASA 325.  2. CKD stage 3: Creatinine lower on milrinone, BMET in am.   3. CAD: NSTEMI in 3/18. LHC with 99% ulcerated lesion proximal RCA with left to right  collaterals, 95% mid LAD stenosis after mid LAD stent. s/p PCI to RCA and LAD on 11/25/16.  - Off plavix for LVAD.  - Continue high intensity statin, good lipids recently.  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 Recent V/Q scan did not show acute or chronic PE. He is a heterozygote for Factor V Leiden, plan for for ASA 325 and warfarin INR 2-3 with LVAD.  5. Chronic N/V/D with gastroparesis: Follows with GI. Quiescent currently.  6. DM: On Jardiance at home, will cover with sliding scale> DM coordinator has seen and increased insulin.   7. S/P Multiple Tooth Extractions 4-15, 19-23, and 27-29. Stable.  8. Rhythm: Frequent ventricular ectopy.  He is on amiodarone currently, hopefully can stop prior to discharge home.   I reviewed the LVAD parameters from today, and compared the results to the patient's prior recorded data.  No programming changes were made.  The LVAD is functioning within specified parameters.  The patient performs LVAD self-test daily.  LVAD interrogation was negative for any significant power changes, alarms or PI events/speed drops.  LVAD equipment check completed and is in good working order.  Back-up equipment present.   LVAD education done on emergency procedures and precautions and reviewed exit site care.  Length of Stay: 5  Loralie Champagne, MD 06/12/2017, 3:27 PM  VAD Team ---  VAD ISSUES ONLY--- Pager [phone] (7am - 7am)  Advanced Heart Failure Team  Pager [phone] (M-F; [place])  Please contact [facility] for night-coverage after hours (4p -7a ) and weekends on [email or web address]

## 2017-06-12 NOTE — Progress Notes (Signed)
Pharmacy Antibiotic Note  Rylynn E Zingale is a 51 y.o. male admitted on 06/07/2017 with surgical prophylaxis.  Pharmacy has been consulted for vancomycin dosing x 48 hrs post-op.  S/p LVAD placement today.  Received vancomycin 1500 mg in OR ~ 745 AM.    Plan: 1. Vancomycin 1g IV q 8 hrs x 48 hrs 2. F/u renal function and clinical course.  Height: 5' 8"  (172.7 cm) Weight: 194 lb 3.6 oz (88.1 kg) IBW/kg (Calculated) : 68.4  Temp (24hrs), Avg:98.6 F (37 C), Min:96.6 F (35.9 C), Max:100.6 F (38.1 C)   Recent Labs Lab 06/09/17 0516 06/10/17 0609 06/11/17 0636 06/12/17 0210 06/12/17 0815 06/12/17 0910 06/12/17 0933 06/12/17 1014 06/12/17 1135 06/12/17 1251  WBC 11.6* 10.1 11.2* 9.9  --   --   --   --   --  17.7*  CREATININE 1.65* 1.57* 1.68* 1.54* 1.30* 1.30* 1.20 1.10 1.10  --     Estimated Creatinine Clearance: 84.8 mL/min (by C-G formula based on SCr of 1.1 mg/dL).    Allergies  Allergen Reactions  . Metformin And Related Diarrhea    Antimicrobials this admission: Vancomycin 10/8 >>  Cefuroxime 10/8 >>   Dose adjustments this admission:  Microbiology results:  BCx:   UCx:    Sputum:   10/5 MRSA PCR: neg  Thank you for allowing pharmacy to be a part of this patient's care.  Uvaldo Rising, BCPS  Clinical Pharmacist Pager [phone]  06/12/2017 1:57 PM

## 2017-06-12 NOTE — Progress Notes (Signed)
Hypoglycemic Event  CBG: 63  Treatment: 15 GM carbohydrate snack  Symptoms: None  Follow-up CBG: Time: 2230 CBG Result:86  Possible Reasons for Event: Inadequate meal intake  Comments/MD notified: Expected result    Burman Blacksmith

## 2017-06-12 NOTE — Anesthesia Postprocedure Evaluation (Signed)
Anesthesia Post Note  Patient: [name]  Procedure(s) Performed: INSERTION OF IMPLANTABLE LEFT VENTRICULAR ASSIST DEVICE-HEARTMATE 3 (N/A Chest) TRANSESOPHAGEAL ECHOCARDIOGRAM (TEE) (N/A )     Patient location during evaluation: PACU Anesthesia Type: General Level of consciousness: awake, awake and alert and oriented Pain management: pain level controlled Vital Signs Assessment: post-procedure vital signs reviewed and stable Respiratory status: spontaneous breathing, nonlabored ventilation and respiratory function stable Cardiovascular status: blood pressure returned to baseline Anesthetic complications: no    Last Vitals:  Vitals:   06/12/17 1615 06/12/17 1630  BP:    Pulse: 83 (!) 30  Resp: 12 15  Temp: (!) 36 C (!) 36 C  SpO2: 100% 100%    Last Pain:  Vitals:   06/12/17 0400  TempSrc: Axillary  PainSc:                  Daesha Insco COKER

## 2017-06-12 NOTE — Progress Notes (Addendum)
Chaplain stopped by to provide support for patient / family of this patient.  [name], a friend of this patient and this patient's family came in to sit with him while I was there.  Chaplain will continue to provide follow up for this patient and family during his journey here in the hospital.    06/12/17 1511  Clinical Encounter Type  Visited With Patient;Family [name] - a family friend)  Visit Type Initial;Spiritual support;Social support;Post-op  Referral From Other (Comment)

## 2017-06-12 NOTE — Progress Notes (Signed)
Pt transported to pre op with transporter. Bedside report given to CRNA. Pt hooked to tele monitor. VSS and RN in room. Eleonore Chiquito RN 2 Heart

## 2017-06-12 NOTE — Brief Op Note (Signed)
06/07/2017 - 06/12/2017  12:24 PM  PATIENT:  [name]  51 y.o. male  PRE-OPERATIVE DIAGNOSIS:  HEART FAILURE ischemic cardiomyopathy  POST-OPERATIVE DIAGNOSIS:  HEART FAILURE ischemic cardiomyopathy  PROCEDURE:  Implantation of Left ventricular assist device - Heart- Mate 3  SURGEON:  Surgeon(s) and Role:    Ivin Poot, MD - Primary    * Bartle, Fernande Boyden, MD - Assisting  PHYSICIAN ASSISTANT: none  ASSISTANTS: Alonza Smoker RNFA   ANESTHESIA:   general  EBL:  Total I/O In: 2341 [I.V.:1900; Blood:1488] Out: 4436 [Urine:970; Blood:500]  BLOOD ADMINISTERED:1 unit CC PRBC and 2 units FFP  DRAINS: 3 Chest Tube(s) in the mediastinum and L pleuralspace   LOCAL MEDICATIONS USED:  NONE  SPECIMEN:  Excision  DISPOSITION OF SPECIMEN:  PATHOLOGY  COUNTS:  YES  TOURNIQUET:  * No tourniquets in log *  DICTATION: .Dragon Dictation  PLAN OF CARE: back to 2H ICU  PATIENT DISPOSITION:  ICU - intubated and hemodynamically stable.   Delay start of Pharmacological VTE agent (>24hrs) due to surgical blood loss or risk of bleeding: yes

## 2017-06-12 NOTE — Anesthesia Procedure Notes (Signed)
Central Venous Catheter Insertion Performed by: Roberts Gaudy, anesthesiologist Start/End10/04/2017 7:00 AM, 06/12/2017 7:10 AM Patient location: Pre-op. Preanesthetic checklist: patient identified, IV checked, site marked, risks and benefits discussed, surgical consent, monitors and equipment checked, pre-op evaluation and timeout performed Position: supine Hand hygiene performed , maximum sterile barriers used  and Seldinger technique used Catheter size: 8.5 Fr PA cath was placed.Sheath introducer Swan type:thermodilution Procedure performed using ultrasound guided technique. Ultrasound Notes:anatomy identified, needle tip was noted to be adjacent to the nerve/plexus identified, no ultrasound evidence of intravascular and/or intraneural injection and image(s) printed for medical record Attempts: 1 Following insertion, line sutured, dressing applied and Biopatch. Post procedure assessment: blood return through all ports, free fluid flow and no air  Patient tolerated the procedure well with no immediate complications.

## 2017-06-12 NOTE — Anesthesia Procedure Notes (Signed)
Arterial Line Insertion Start/End10/04/2017 7:20 AM Performed by: Roberts Gaudy, HAYES, CHRISTINE T, CRNA  Patient location: OR. Preanesthetic checklist: patient identified, IV checked, site marked, risks and benefits discussed, surgical consent, monitors and equipment checked, pre-op evaluation and timeout performed Lidocaine 1% used for infiltration and patient sedated Right, radial was placed Catheter size: 20 G Hand hygiene performed , maximum sterile barriers used  and Seldinger technique used Allen's test indicative of satisfactory collateral circulation Attempts: 3 Procedure performed without using ultrasound guided technique. Following insertion, dressing applied and Biopatch. Post procedure assessment: normal  Patient tolerated the procedure well with no immediate complications. Additional procedure comments: Inserted by Melvyn Novas, SRNA.  [name]

## 2017-06-12 NOTE — Anesthesia Procedure Notes (Signed)
Central Venous Catheter Insertion Performed by: Roberts Gaudy, anesthesiologist Start/End10/04/2017 7:10 AM, 06/12/2017 7:15 AM Preanesthetic checklist: patient identified, IV checked, site marked, risks and benefits discussed, surgical consent, monitors and equipment checked, pre-op evaluation and timeout performed Position: supine Hand hygiene performed , maximum sterile barriers used  and Seldinger technique used Catheter size: 8 Fr Central line was placed.Double lumen Procedure performed using ultrasound guided technique. Ultrasound Notes:anatomy identified, needle tip was noted to be adjacent to the nerve/plexus identified, no ultrasound evidence of intravascular and/or intraneural injection and image(s) printed for medical record Attempts: 1 Following insertion, line sutured, dressing applied and Biopatch. Post procedure assessment: blood return through all ports, free fluid flow and no air  Patient tolerated the procedure well with no immediate complications.

## 2017-06-12 NOTE — Progress Notes (Signed)
Day of Surgery Procedure(s) (LRB): INSERTION OF IMPLANTABLE LEFT VENTRICULAR ASSIST DEVICE-HEARTMATE 3 (N/A) TRANSESOPHAGEAL ECHOCARDIOGRAM (TEE) (N/A) Subjective: Postop VAD note- sedated on vent  with VAD flow 3.2 L, MAP 85-90 , NTg started Not bleeding Co-ox 63 Lots of PVS so loaded with amio and now a-pacing Objective: Vital signs in last 24 hours: Temp:  [96.6 F (35.9 C)-100.6 F (38.1 C)] 96.6 F (35.9 C) (10/08 1715) Pulse Rate:  [30-92] 30 (10/08 1715) Cardiac Rhythm: Atrial paced (10/08 1600) Resp:  [12-22] 12 (10/08 1715) BP: (79-139)/(49-84) 79/65 (10/08 1715) SpO2:  [92 %-100 %] 100 % (10/08 1715) FiO2 (%):  [50 %] 50 % (10/08 1300) Weight:  [194 lb 3.6 oz (88.1 kg)] 194 lb 3.6 oz (88.1 kg) (10/08 0414)  Hemodynamic parameters for last 24 hours: PAP: (24-36)/(14-22) 34/18 CVP:  [8 mmHg-18 mmHg] 16 mmHg CO:  [3.2 L/min] 3.2 L/min CI:  [1.6 L/min/m2] 1.6 L/min/m2  Intake/Output from previous day: 10/07 0701 - 10/08 0700 In: 1703.9 [P.O.:1160; I.V.:543.9] Out: 500 [Urine:500] Intake/Output this shift: Total I/O In: 4350.2 [I.V.:2512.2; Blood:1488; IV Piggyback:350] Out: 1985 [Urine:1315; Blood:500; Chest Tube:170]       Exam    General- alert and comfortable   Lungs- clear without rales, wheezes   Cor- paced 88/min   Abdomen- soft, non-tender   Extremities - warm, non-tender, minimal edema   Neuro- oriented, appropriate, no focal weakness   Lab Results:  Recent Labs  06/12/17 0210  06/12/17 1155 06/12/17 1234 06/12/17 1251  WBC 9.9  --   --   --  17.7*  HGB 8.5*  < > 9.5*  --  9.5*  HCT 27.5*  < > 28.0*  --  29.1*  PLT 188  < >  --  111* 111*  < > = values in this interval not displayed. BMET:  Recent Labs  06/11/17 0636 06/12/17 0210  06/12/17 1014 06/12/17 1135 06/12/17 1155  NA 136 133*  < > 138 139 142  K 4.1 3.6  < > 3.5 3.1* 3.0*  CL 100* 99*  < > 98* 100*  --   CO2 28 27  --   --   --   --   GLUCOSE 136* 118*  < > 162* 145*  --    BUN 27* 25*  < > 22* 21*  --   CREATININE 1.68* 1.54*  < > 1.10 1.10  --   CALCIUM 8.6* 8.1*  --   --   --   --   < > = values in this interval not displayed.  PT/INR:  Recent Labs  06/12/17 1251  LABPROT 17.5*  INR 1.44   ABG    Component Value Date/Time   PHART 7.440 06/12/2017 1130   HCO3 25.8 06/12/2017 1155   TCO2 27 06/12/2017 1155   ACIDBASEDEF 2.0 04/05/2017 1706   O2SAT 62.3 06/12/2017 1650   CBG (last 3)   Recent Labs  06/11/17 2152 06/12/17 1426 06/12/17 1519  GLUCAP 84 114* 89    Assessment/Plan: S/P HM3 VAD Plan NO wean thru the night and extubation in am    LOS: 5 days    Tharon Aquas Trigt III 06/12/2017

## 2017-06-12 NOTE — Progress Notes (Signed)
  Echocardiogram Echocardiogram Transesophageal has been performed.  Darlina Sicilian M 06/12/2017, 8:33 AM

## 2017-06-12 NOTE — Anesthesia Preprocedure Evaluation (Signed)
Anesthesia Evaluation  Patient identified by MRN, date of birth, ID band Patient awake    Reviewed: Allergy & Precautions, NPO status , Patient's Chart, lab work & pertinent test results  Airway Mallampati: II  TM Distance: >3 FB Neck ROM: Full    Dental  (+) Edentulous Upper, Edentulous Lower   Pulmonary     + decreased breath sounds      Cardiovascular hypertension,  Rhythm:Regular Rate:Normal     Neuro/Psych    GI/Hepatic   Endo/Other  diabetes  Renal/GU      Musculoskeletal   Abdominal   Peds  Hematology   Anesthesia Other Findings   Reproductive/Obstetrics                             Anesthesia Physical Anesthesia Plan  ASA: III  Anesthesia Plan: General   Post-op Pain Management:    Induction: Intravenous  PONV Risk Score and Plan: Ondansetron and Dexamethasone  Airway Management Planned: Oral ETT  Additional Equipment: Arterial line, CVP, PA Cath, 3D TEE and Ultrasound Guidance Line Placement  Intra-op Plan:   Post-operative Plan: Post-operative intubation/ventilation  Informed Consent: I have reviewed the patients History and Physical, chart, labs and discussed the procedure including the risks, benefits and alternatives for the proposed anesthesia with the patient or authorized representative who has indicated his/her understanding and acceptance.     Plan Discussed with: CRNA and Anesthesiologist  Anesthesia Plan Comments:         Anesthesia Quick Evaluation

## 2017-06-12 NOTE — Progress Notes (Signed)
Pre Procedure note for inpatients:   [name] has been scheduled for Procedure(s) with comments: INSERTION OF IMPLANTABLE LEFT VENTRICULAR ASSIST DEVICE-HEARTMATE 3 (N/A) - HM3 LVAD  CIRC ARREST  NITRIC OXIDE TRANSESOPHAGEAL ECHOCARDIOGRAM (TEE) (N/A) today. The various methods of treatment have been discussed with the patient. After consideration of the risks, benefits and treatment options the patient has consented to the planned procedure.   The patient has been seen and labs reviewed. There are no changes in the patient's condition to prevent proceeding with the planned procedure today.  Recent labs:  Lab Results  Component Value Date   WBC 9.9 06/12/2017   HGB 8.5 (L) 06/12/2017   HCT 27.5 (L) 06/12/2017   PLT 188 06/12/2017   GLUCOSE 118 (H) 06/12/2017   CHOL 128 05/04/2017   TRIG 118 05/04/2017   HDL 50 05/04/2017   LDLCALC 54 05/04/2017   ALT 36 06/12/2017   AST 31 06/12/2017   NA 133 (L) 06/12/2017   K 3.6 06/12/2017   CL 99 (L) 06/12/2017   CREATININE 1.54 (H) 06/12/2017   BUN 25 (H) 06/12/2017   CO2 27 06/12/2017   TSH 2.353 05/04/2017   INR 0.99 06/10/2017   HGBA1C 10.1 (H) 06/07/2017   MICROALBUR 150 03/06/2017    Len Childs, MD 06/12/2017 7:27 AM

## 2017-06-13 ENCOUNTER — Encounter (HOSPITAL_COMMUNITY): Payer: Self-pay | Admitting: Cardiothoracic Surgery

## 2017-06-13 ENCOUNTER — Inpatient Hospital Stay (HOSPITAL_COMMUNITY): Payer: BLUE CROSS/BLUE SHIELD

## 2017-06-13 DIAGNOSIS — Z954 Presence of other heart-valve replacement: Secondary | ICD-10-CM

## 2017-06-13 LAB — POCT I-STAT, CHEM 8
BUN: 21 mg/dL — AB (ref 6–20)
CHLORIDE: 99 mmol/L — AB (ref 101–111)
Calcium, Ion: 1.12 mmol/L — ABNORMAL LOW (ref 1.15–1.40)
Creatinine, Ser: 2 mg/dL — ABNORMAL HIGH (ref 0.61–1.24)
Glucose, Bld: 175 mg/dL — ABNORMAL HIGH (ref 65–99)
HEMATOCRIT: 30 % — AB (ref 39.0–52.0)
Hemoglobin: 10.2 g/dL — ABNORMAL LOW (ref 13.0–17.0)
POTASSIUM: 5.2 mmol/L — AB (ref 3.5–5.1)
SODIUM: 133 mmol/L — AB (ref 135–145)
TCO2: 22 mmol/L (ref 22–32)

## 2017-06-13 LAB — APTT
aPTT: 41 seconds — ABNORMAL HIGH (ref 24–36)
aPTT: 68 seconds — ABNORMAL HIGH (ref 24–36)

## 2017-06-13 LAB — PREPARE FRESH FROZEN PLASMA
UNIT DIVISION: 0
Unit division: 0
Unit division: 0
Unit division: 0

## 2017-06-13 LAB — CBC WITH DIFFERENTIAL/PLATELET
Basophils Absolute: 0 10*3/uL (ref 0.0–0.1)
Basophils Relative: 0 %
Eosinophils Absolute: 0.5 10*3/uL (ref 0.0–0.7)
Eosinophils Relative: 3 %
HCT: 28.9 % — ABNORMAL LOW (ref 39.0–52.0)
Hemoglobin: 9.6 g/dL — ABNORMAL LOW (ref 13.0–17.0)
Lymphocytes Relative: 6 %
Lymphs Abs: 1 10*3/uL (ref 0.7–4.0)
MCH: 26 pg (ref 26.0–34.0)
MCHC: 33.2 g/dL (ref 30.0–36.0)
MCV: 78.3 fL (ref 78.0–100.0)
Monocytes Absolute: 0.9 10*3/uL (ref 0.1–1.0)
Monocytes Relative: 6 %
Neutro Abs: 13.4 10*3/uL — ABNORMAL HIGH (ref 1.7–7.7)
Neutrophils Relative %: 85 %
Platelets: 123 10*3/uL — ABNORMAL LOW (ref 150–400)
RBC: 3.69 MIL/uL — ABNORMAL LOW (ref 4.22–5.81)
RDW: 14.5 % (ref 11.5–15.5)
WBC: 15.8 10*3/uL — ABNORMAL HIGH (ref 4.0–10.5)

## 2017-06-13 LAB — COMPREHENSIVE METABOLIC PANEL
ALT: 24 U/L (ref 17–63)
AST: 89 U/L — ABNORMAL HIGH (ref 15–41)
Albumin: 3.4 g/dL — ABNORMAL LOW (ref 3.5–5.0)
Alkaline Phosphatase: 71 U/L (ref 38–126)
Anion gap: 9 (ref 5–15)
BUN: 19 mg/dL (ref 6–20)
CO2: 22 mmol/L (ref 22–32)
Calcium: 8.3 mg/dL — ABNORMAL LOW (ref 8.9–10.3)
Chloride: 103 mmol/L (ref 101–111)
Creatinine, Ser: 1.62 mg/dL — ABNORMAL HIGH (ref 0.61–1.24)
GFR calc Af Amer: 55 mL/min — ABNORMAL LOW (ref >60)
GFR calc non Af Amer: 47 mL/min — ABNORMAL LOW (ref >60)
Glucose, Bld: 128 mg/dL — ABNORMAL HIGH (ref 65–99)
Potassium: 3.9 mmol/L (ref 3.5–5.1)
Sodium: 134 mmol/L — ABNORMAL LOW (ref 135–145)
Total Bilirubin: 2.2 mg/dL — ABNORMAL HIGH (ref 0.3–1.2)
Total Protein: 5.7 g/dL — ABNORMAL LOW (ref 6.5–8.1)

## 2017-06-13 LAB — POCT I-STAT 3, ART BLOOD GAS (G3+)
Acid-base deficit: 2 mmol/L (ref 0.0–2.0)
Acid-base deficit: 2 mmol/L (ref 0.0–2.0)
Acid-base deficit: 3 mmol/L — ABNORMAL HIGH (ref 0.0–2.0)
BICARBONATE: 23.5 mmol/L (ref 20.0–28.0)
BICARBONATE: 22.6 mmol/L (ref 20.0–28.0)
Bicarbonate: 23.7 mmol/L (ref 20.0–28.0)
Bicarbonate: 23.2 mmol/L (ref 20.0–28.0)
O2 SAT: 98 %
O2 Saturation: 98 %
O2 Saturation: 97 %
O2 Saturation: 95 %
PCO2 ART: 32.4 mmHg (ref 32.0–48.0)
PCO2 ART: 44 mmHg (ref 32.0–48.0)
PH ART: 7.321 — AB (ref 7.350–7.450)
PO2 ART: 86 mmHg (ref 83.0–108.0)
PO2 ART: 92 mmHg (ref 83.0–108.0)
Patient temperature: 36.1
Patient temperature: 36.5
Patient temperature: 36.4
Patient temperature: 37.4
TCO2: 25 mmol/L (ref 22–32)
TCO2: 24 mmol/L (ref 22–32)
TCO2: 25 mmol/L (ref 22–32)
TCO2: 24 mmol/L (ref 22–32)
pCO2 arterial: 41.1 mmHg (ref 32.0–48.0)
pCO2 arterial: 40.6 mmHg (ref 32.0–48.0)
pH, Arterial: 7.469 — ABNORMAL HIGH (ref 7.350–7.450)
pH, Arterial: 7.357 (ref 7.350–7.450)
pH, Arterial: 7.368 (ref 7.350–7.450)
pO2, Arterial: 103 mmHg (ref 83.0–108.0)
pO2, Arterial: 86 mmHg (ref 83.0–108.0)

## 2017-06-13 LAB — GLUCOSE, CAPILLARY
GLUCOSE-CAPILLARY: 101 mg/dL — AB (ref 65–99)
GLUCOSE-CAPILLARY: 106 mg/dL — AB (ref 65–99)
GLUCOSE-CAPILLARY: 113 mg/dL — AB (ref 65–99)
GLUCOSE-CAPILLARY: 118 mg/dL — AB (ref 65–99)
GLUCOSE-CAPILLARY: 119 mg/dL — AB (ref 65–99)
GLUCOSE-CAPILLARY: 130 mg/dL — AB (ref 65–99)
GLUCOSE-CAPILLARY: 86 mg/dL (ref 65–99)
Glucose-Capillary: 95 mg/dL (ref 65–99)
Glucose-Capillary: 119 mg/dL — ABNORMAL HIGH (ref 65–99)
Glucose-Capillary: 122 mg/dL — ABNORMAL HIGH (ref 65–99)
Glucose-Capillary: 117 mg/dL — ABNORMAL HIGH (ref 65–99)
Glucose-Capillary: 125 mg/dL — ABNORMAL HIGH (ref 65–99)
Glucose-Capillary: 128 mg/dL — ABNORMAL HIGH (ref 65–99)
Glucose-Capillary: 122 mg/dL — ABNORMAL HIGH (ref 65–99)
Glucose-Capillary: 131 mg/dL — ABNORMAL HIGH (ref 65–99)

## 2017-06-13 LAB — BPAM FFP
Blood Product Expiration Date: 201810122359
Blood Product Expiration Date: 201810122359
Blood Product Expiration Date: 201810122359
Blood Product Expiration Date: 201810122359
ISSUE DATE / TIME: 201810080853
ISSUE DATE / TIME: 201810080853
ISSUE DATE / TIME: 201810081001
ISSUE DATE / TIME: 201810081001
UNIT TYPE AND RH: 5100
Unit Type and Rh: 5100
Unit Type and Rh: 5100
Unit Type and Rh: 5100

## 2017-06-13 LAB — COOXEMETRY PANEL
Carboxyhemoglobin: 0.7 % (ref 0.5–1.5)
Carboxyhemoglobin: 1.1 % (ref 0.5–1.5)
Methemoglobin: 2 % — ABNORMAL HIGH (ref 0.0–1.5)
Methemoglobin: 1.6 % — ABNORMAL HIGH (ref 0.0–1.5)
O2 Saturation: 76.3 %
O2 Saturation: 60.7 %
Total hemoglobin: 9.4 g/dL — ABNORMAL LOW (ref 12.0–16.0)
Total hemoglobin: 10.4 g/dL — ABNORMAL LOW (ref 12.0–16.0)

## 2017-06-13 LAB — CBC
HCT: 27.5 % — ABNORMAL LOW (ref 39.0–52.0)
HCT: 28.8 % — ABNORMAL LOW (ref 39.0–52.0)
Hemoglobin: 9 g/dL — ABNORMAL LOW (ref 13.0–17.0)
Hemoglobin: 9.6 g/dL — ABNORMAL LOW (ref 13.0–17.0)
MCH: 25.8 pg — ABNORMAL LOW (ref 26.0–34.0)
MCH: 26.3 pg (ref 26.0–34.0)
MCHC: 32.7 g/dL (ref 30.0–36.0)
MCHC: 33.3 g/dL (ref 30.0–36.0)
MCV: 78.8 fL (ref 78.0–100.0)
MCV: 78.9 fL (ref 78.0–100.0)
Platelets: 111 10*3/uL — ABNORMAL LOW (ref 150–400)
Platelets: 133 10*3/uL — ABNORMAL LOW (ref 150–400)
RBC: 3.49 MIL/uL — ABNORMAL LOW (ref 4.22–5.81)
RBC: 3.65 MIL/uL — ABNORMAL LOW (ref 4.22–5.81)
RDW: 14.4 % (ref 11.5–15.5)
RDW: 15.2 % (ref 11.5–15.5)
WBC: 14.7 10*3/uL — ABNORMAL HIGH (ref 4.0–10.5)
WBC: 24.4 10*3/uL — ABNORMAL HIGH (ref 4.0–10.5)

## 2017-06-13 LAB — LACTATE DEHYDROGENASE: LDH: 343 U/L — AB (ref 98–192)

## 2017-06-13 LAB — PHOSPHORUS: Phosphorus: 3.1 mg/dL (ref 2.5–4.6)

## 2017-06-13 LAB — CREATININE, SERUM
Creatinine, Ser: 2.08 mg/dL — ABNORMAL HIGH (ref 0.61–1.24)
GFR calc Af Amer: 40 mL/min — ABNORMAL LOW (ref >60)
GFR calc non Af Amer: 35 mL/min — ABNORMAL LOW (ref >60)

## 2017-06-13 LAB — BPAM PLATELET PHERESIS
Blood Product Expiration Date: 201810092359
ISSUE DATE / TIME: 201810081044
Unit Type and Rh: 9500

## 2017-06-13 LAB — PREPARE PLATELET PHERESIS: Unit division: 0

## 2017-06-13 LAB — MAGNESIUM
Magnesium: 2.5 mg/dL — ABNORMAL HIGH (ref 1.7–2.4)
Magnesium: 2.4 mg/dL (ref 1.7–2.4)

## 2017-06-13 LAB — PROTIME-INR
INR: 1.23
Prothrombin Time: 15.4 seconds — ABNORMAL HIGH (ref 11.4–15.2)

## 2017-06-13 LAB — BRAIN NATRIURETIC PEPTIDE: B Natriuretic Peptide: 432.3 pg/mL — ABNORMAL HIGH (ref 0.0–100.0)

## 2017-06-13 MED ORDER — INSULIN ASPART 100 UNIT/ML ~~LOC~~ SOLN
3.0000 [IU] | Freq: Three times a day (TID) | SUBCUTANEOUS | Status: DC
  Administered 2017-06-14 (×3): 3 [IU] via SUBCUTANEOUS

## 2017-06-13 MED ORDER — DOPAMINE-DEXTROSE 3.2-5 MG/ML-% IV SOLN
2.0000 ug/kg/min | INTRAVENOUS | Status: DC
  Administered 2017-06-14 – 2017-06-15 (×2): 3 ug/kg/min via INTRAVENOUS
  Administered 2017-06-17: 2 ug/kg/min via INTRAVENOUS
  Filled 2017-06-13 (×3): qty 250

## 2017-06-13 MED ORDER — DOPAMINE-DEXTROSE 3.2-5 MG/ML-% IV SOLN: 3.0000 ug/kg/min | INTRAVENOUS | Status: DC

## 2017-06-13 MED ORDER — ORAL CARE MOUTH RINSE
15.0000 mL | Freq: Two times a day (BID) | OROMUCOSAL | Status: DC
  Administered 2017-06-13 – 2017-06-22 (×15): 15 mL via OROMUCOSAL

## 2017-06-13 MED ORDER — CHLORHEXIDINE GLUCONATE 0.12 % MT SOLN
15.0000 mL | Freq: Two times a day (BID) | OROMUCOSAL | Status: DC
  Administered 2017-06-13 – 2017-06-22 (×15): 15 mL via OROMUCOSAL
  Filled 2017-06-13 (×10): qty 15

## 2017-06-13 MED ORDER — FUROSEMIDE 10 MG/ML IJ SOLN
20.0000 mg | Freq: Once | INTRAMUSCULAR | Status: AC
  Administered 2017-06-13: 20 mg via INTRAVENOUS
  Filled 2017-06-13: qty 2

## 2017-06-13 MED ORDER — FUROSEMIDE 10 MG/ML IJ SOLN
INTRAMUSCULAR | Status: AC
  Filled 2017-06-13: qty 2

## 2017-06-13 MED ORDER — INSULIN ASPART 100 UNIT/ML ~~LOC~~ SOLN
0.0000 [IU] | SUBCUTANEOUS | Status: DC
  Administered 2017-06-13 – 2017-06-14 (×3): 4 [IU] via SUBCUTANEOUS
  Administered 2017-06-14: 2 [IU] via SUBCUTANEOUS
  Administered 2017-06-14 (×2): 4 [IU] via SUBCUTANEOUS
  Administered 2017-06-14: 8 [IU] via SUBCUTANEOUS
  Administered 2017-06-15: 2 [IU] via SUBCUTANEOUS
  Administered 2017-06-16: 8 [IU] via SUBCUTANEOUS
  Administered 2017-06-16 – 2017-06-17 (×4): 2 [IU] via SUBCUTANEOUS
  Administered 2017-06-17: 8 [IU] via SUBCUTANEOUS
  Administered 2017-06-17 (×4): 4 [IU] via SUBCUTANEOUS
  Administered 2017-06-18: 2 [IU] via SUBCUTANEOUS
  Administered 2017-06-18: 8 [IU] via SUBCUTANEOUS
  Administered 2017-06-18: 2 [IU] via SUBCUTANEOUS
  Administered 2017-06-18: 4 [IU] via SUBCUTANEOUS
  Administered 2017-06-18 – 2017-06-19 (×2): 2 [IU] via SUBCUTANEOUS
  Administered 2017-06-19 (×2): 4 [IU] via SUBCUTANEOUS
  Administered 2017-06-19: 20 [IU] via SUBCUTANEOUS
  Administered 2017-06-20: 2 [IU] via SUBCUTANEOUS
  Administered 2017-06-20: 8 [IU] via SUBCUTANEOUS
  Administered 2017-06-20: 16 [IU] via SUBCUTANEOUS
  Administered 2017-06-20 – 2017-06-21 (×2): 2 [IU] via SUBCUTANEOUS
  Administered 2017-06-21: 4 [IU] via SUBCUTANEOUS
  Administered 2017-06-21: 2 [IU] via SUBCUTANEOUS
  Administered 2017-06-21 – 2017-06-22 (×3): 8 [IU] via SUBCUTANEOUS
  Administered 2017-06-22: 2 [IU] via SUBCUTANEOUS
  Administered 2017-06-22 (×3): 4 [IU] via SUBCUTANEOUS
  Administered 2017-06-22: 2 [IU] via SUBCUTANEOUS
  Administered 2017-06-23: 4 [IU] via SUBCUTANEOUS
  Administered 2017-06-23: 2 [IU] via SUBCUTANEOUS
  Administered 2017-06-23 – 2017-06-24 (×4): 4 [IU] via SUBCUTANEOUS
  Administered 2017-06-24: 8 [IU] via SUBCUTANEOUS
  Administered 2017-06-24: 4 [IU] via SUBCUTANEOUS
  Administered 2017-06-24 (×2): 2 [IU] via SUBCUTANEOUS
  Administered 2017-06-25 (×2): 8 [IU] via SUBCUTANEOUS
  Administered 2017-06-25 (×2): 4 [IU] via SUBCUTANEOUS
  Administered 2017-06-25: 12 [IU] via SUBCUTANEOUS
  Administered 2017-06-25 (×2): 2 [IU] via SUBCUTANEOUS
  Administered 2017-06-26: 4 [IU] via SUBCUTANEOUS
  Administered 2017-06-26: 2 [IU] via SUBCUTANEOUS
  Administered 2017-06-26: 4 [IU] via SUBCUTANEOUS
  Administered 2017-06-26: 2 [IU] via SUBCUTANEOUS
  Administered 2017-06-27: 8 [IU] via SUBCUTANEOUS
  Administered 2017-06-27: 4 [IU] via SUBCUTANEOUS
  Administered 2017-06-27 – 2017-06-28 (×3): 2 [IU] via SUBCUTANEOUS
  Administered 2017-06-28: 4 [IU] via SUBCUTANEOUS
  Administered 2017-06-28 (×3): 2 [IU] via SUBCUTANEOUS
  Administered 2017-06-28: 4 [IU] via SUBCUTANEOUS
  Administered 2017-06-28: 8 [IU] via SUBCUTANEOUS
  Administered 2017-06-29: 2 [IU] via SUBCUTANEOUS
  Administered 2017-06-29 (×3): 4 [IU] via SUBCUTANEOUS
  Administered 2017-06-30: 2 [IU] via SUBCUTANEOUS
  Administered 2017-06-30: 8 [IU] via SUBCUTANEOUS
  Administered 2017-06-30: 2 [IU] via SUBCUTANEOUS
  Administered 2017-06-30: 4 [IU] via SUBCUTANEOUS
  Administered 2017-07-01: 2 [IU] via SUBCUTANEOUS
  Administered 2017-07-01: 8 [IU] via SUBCUTANEOUS
  Administered 2017-07-01: 2 [IU] via SUBCUTANEOUS
  Administered 2017-07-01 – 2017-07-02 (×3): 4 [IU] via SUBCUTANEOUS
  Administered 2017-07-02: 2 [IU] via SUBCUTANEOUS
  Administered 2017-07-02: 4 [IU] via SUBCUTANEOUS
  Administered 2017-07-02: 8 [IU] via SUBCUTANEOUS
  Administered 2017-07-02 (×2): 2 [IU] via SUBCUTANEOUS
  Administered 2017-07-03: 4 [IU] via SUBCUTANEOUS

## 2017-06-13 MED ORDER — METOLAZONE 5 MG PO TABS
5.0000 mg | ORAL_TABLET | Freq: Once | ORAL | Status: AC
  Administered 2017-06-13: 5 mg via ORAL
  Filled 2017-06-13: qty 1

## 2017-06-13 MED ORDER — METOLAZONE 5 MG PO TABS: 5.0000 mg | ORAL_TABLET | Freq: Once | ORAL | Status: DC

## 2017-06-13 MED ORDER — POTASSIUM CHLORIDE CRYS ER 20 MEQ PO TBCR: 20.0000 meq | EXTENDED_RELEASE_TABLET | Freq: Once | ORAL | Status: DC

## 2017-06-13 MED ORDER — MUPIROCIN 2 % EX OINT
TOPICAL_OINTMENT | Freq: Two times a day (BID) | CUTANEOUS | Status: DC
  Administered 2017-06-13 (×2): 1 via NASAL
  Administered 2017-06-14 – 2017-06-15 (×3): via NASAL
  Administered 2017-06-15 – 2017-06-16 (×2): 1 via NASAL
  Administered 2017-06-16 – 2017-06-17 (×2): via NASAL
  Administered 2017-06-17: 1 via NASAL
  Administered 2017-06-18: 10:00:00 via NASAL
  Administered 2017-06-18: 1 via NASAL
  Administered 2017-06-19 – 2017-06-20 (×4): via NASAL
  Administered 2017-06-21: 1 via NASAL
  Administered 2017-06-21 – 2017-06-23 (×4): via NASAL
  Administered 2017-06-23 – 2017-06-24 (×2): 1 via NASAL
  Administered 2017-06-24 – 2017-06-25 (×3): via NASAL
  Administered 2017-06-26: 1 via NASAL
  Administered 2017-06-26: 22:00:00 via NASAL
  Administered 2017-06-27: 1 via NASAL
  Administered 2017-06-28: 09:00:00 via NASAL
  Administered 2017-06-28: 1 via NASAL
  Administered 2017-06-29 – 2017-06-30 (×3): via NASAL
  Filled 2017-06-13: qty 22

## 2017-06-13 MED ORDER — FUROSEMIDE 10 MG/ML IJ SOLN: 20.0000 mg | Freq: Two times a day (BID) | INTRAMUSCULAR | Status: DC

## 2017-06-13 MED ORDER — WARFARIN - PHYSICIAN DOSING INPATIENT
Freq: Every day | Status: DC
  Administered 2017-06-13 – 2017-06-15 (×3)

## 2017-06-13 MED ORDER — FUROSEMIDE 10 MG/ML IJ SOLN
20.0000 mg | Freq: Once | INTRAMUSCULAR | Status: AC
  Administered 2017-06-13: 20 mg via INTRAVENOUS

## 2017-06-13 MED ORDER — HEPARIN (PORCINE) IN NACL 100-0.45 UNIT/ML-% IJ SOLN
800.0000 [IU]/h | INTRAMUSCULAR | Status: DC
  Administered 2017-06-13: 800 [IU]/h via INTRAVENOUS
  Administered 2017-06-14: 700 [IU]/h via INTRAVENOUS
  Filled 2017-06-13 (×3): qty 250

## 2017-06-13 MED ORDER — WARFARIN SODIUM 2.5 MG PO TABS
2.5000 mg | ORAL_TABLET | Freq: Every day | ORAL | Status: DC
  Administered 2017-06-13 – 2017-06-14 (×2): 2.5 mg via ORAL
  Filled 2017-06-13 (×2): qty 1

## 2017-06-13 MED ORDER — INSULIN DETEMIR 100 UNIT/ML ~~LOC~~ SOLN
8.0000 [IU] | Freq: Two times a day (BID) | SUBCUTANEOUS | Status: DC
  Administered 2017-06-13 (×2): 8 [IU] via SUBCUTANEOUS
  Filled 2017-06-13 (×3): qty 0.08

## 2017-06-13 MED ORDER — DEXMEDETOMIDINE HCL IN NACL 400 MCG/100ML IV SOLN: 0.1000 ug/kg/h | INTRAVENOUS | Status: DC

## 2017-06-13 MED ORDER — MILRINONE LACTATE IN DEXTROSE 20-5 MG/100ML-% IV SOLN
0.1250 ug/kg/min | INTRAVENOUS | Status: DC
  Administered 2017-06-13 – 2017-06-21 (×21): 0.375 ug/kg/min via INTRAVENOUS
  Administered 2017-06-21 – 2017-06-22 (×2): 0.25 ug/kg/min via INTRAVENOUS
  Administered 2017-06-22: 0.125 ug/kg/min via INTRAVENOUS
  Filled 2017-06-13 (×24): qty 100

## 2017-06-13 MED ORDER — ALBUMIN HUMAN 5 % IV SOLN
250.0000 mL | INTRAVENOUS | Status: AC | PRN
  Administered 2017-06-13: 250 mL via INTRAVENOUS
  Filled 2017-06-13: qty 250

## 2017-06-13 MED ORDER — FUROSEMIDE 10 MG/ML IJ SOLN
15.0000 mg/h | INTRAVENOUS | Status: DC
  Administered 2017-06-13: 8 mg/h via INTRAVENOUS
  Filled 2017-06-13 (×5): qty 25

## 2017-06-13 MED ORDER — ORAL CARE MOUTH RINSE: 15.0000 mL | Freq: Two times a day (BID) | OROMUCOSAL | Status: DC

## 2017-06-13 NOTE — Progress Notes (Signed)
OT Cancellation Note  Patient Details Name: [name] MRN: [id number] DOB: [birth year]   Cancelled Treatment:    Reason Eval/Treat Not Completed: Patient not medically ready. Pt sedated on ventilator. Additionally, per orders, pt to begin rehabilitation POD #2. Will check back as appropriate for OT evaluation.   Norman Herrlich, MS OTR/L  Pager: [name] 06/13/2017, 7:17 AM

## 2017-06-13 NOTE — Progress Notes (Signed)
HeartMate 3 Rounding Note   postop day #1  Subjective:    HeartMate 3 implantation October 8 with preoperative ischemic cardiomyopathy, advanced heart failure on IV inotropes, indication was destination therapy  Patient stable postop day 1. Extubated this a.m. Stable LVAD parameters with flow 4.2 L/m. Adequate RV function with CVP 12 and c0-0x 75% Patient atrially paced Chest x-ray with mild edema Minimal chest tube drainage Starting IV heparin low-dose with history of DVT and factor V Leiden deficiency  LVAD INTERROGATION:  HeartMate IIILVAD:  Flow 4.2 liters/min, speed 5400, power 4.0, PI 3.0.  Controller Serial intact.   Objective:    Vital Signs:   Temp:  [96.6 F (35.9 C)-98.8 F (37.1 C)] 98.8 F (37.1 C) (10/09 1515) Pulse Rate:  [30-118] 87 (10/09 1210) Resp:  [12-29] 26 (10/09 1515) BP: (78-122)/(55-99) 107/93 (10/09 1500) SpO2:  [93 %-100 %] 98 % (10/09 1515) FiO2 (%):  [40 %-50 %] 40 % (10/09 0805) Weight:  [217 lb 13 oz (98.8 kg)] 217 lb 13 oz (98.8 kg) (10/09 0530) Last BM Date: 06/21/17 Mean arterial Pressure 85 mmHg  Intake/Output:   Intake/Output Summary (Last 24 hours) at 06/13/17 1529 Last data filed at 06/13/17 1500  Gross per 24 hour  Intake          5251.13 ml  Output             1520 ml  Net          3731.13 ml     Physical Exam: General:  Well appearing. No resp difficulty HEENT: normal Neck: supple. JVP .; no bruits. No lymphadenopathy or thryomegaly appreciated. Cor: Mechanical heart sounds with LVAD hum present. Lungs: clear Abdomen: soft, nontender, nondistended. No hepatosplenomegaly. No bruits or masses. Good bowel sounds. Extremities: no cyanosis, clubbing, rash, mild edema of the extremities edema Neuro: alert & orientedx3, cranial nerves grossly intact. moves all 4 extremities w/o difficulty. Affect pleasant  Telemetry: A paced at 88/m  Labs: Basic Metabolic Panel:  Recent Labs Lab 06/09/17 1632 06/10/17 0609 06/11/17 0636  06/12/17 0210  06/12/17 1135  06/12/17 1300 06/12/17 1827 06/12/17 1830 06/12/17 2259 06/12/17 2306 06/13/17 0320  NA  --  137 136 133*  < > 139  < > 139 136 134*  --  137 134*  K  --  4.2 4.1 3.6  < > 3.1*  < > 3.1* 4.0 3.9  --  3.8 3.9  CL  --  103 100* 99*  < > 100*  --   --  101 104  --  100* 103  CO2  --  26 28 27   --   --   --   --   --  22  --   --  22  GLUCOSE  --  128* 136* 118*  < > 145*  --  111* 149* 141*  --  99 128*  BUN  --  31* 27* 25*  < > 21*  --   --  19 18  --  20 19  CREATININE  --  1.57* 1.68* 1.54*  < > 1.10  --   --  1.20 1.34* 1.52* 1.40* 1.62*  CALCIUM  --  8.6* 8.6* 8.1*  --   --   --   --   --  8.0*  --   --  8.3*  MG 2.1 2.1  --   --   --   --   --   --   --  2.9* 2.7*  --  2.5*  PHOS  --   --   --   --   --   --   --   --   --   --   --   --  3.1  < > = values in this interval not displayed.  Liver Function Tests:  Recent Labs Lab 06/09/17 0516 06/10/17 0609 06/11/17 0636 06/12/17 0210 06/13/17 0320  AST 83* 54* 41 31 89*  ALT 65* 59 48 36 24  ALKPHOS 101 101 117 98 71  BILITOT 0.5 0.7 0.7 0.5 2.2*  PROT 6.3* 7.0 7.2 6.0* 5.7*  ALBUMIN 2.7* 3.0* 3.1* 2.6* 3.4*   No results for input(s): LIPASE, AMYLASE in the last 168 hours. No results for input(s): AMMONIA in the last 168 hours.  CBC:  Recent Labs Lab 06/09/17 0516 06/10/17 0609 06/11/17 0636 06/12/17 0210  06/12/17 1234 06/12/17 1251  06/12/17 1827 06/12/17 1830 06/12/17 2259 06/12/17 2306 06/13/17 0320  WBC 11.6* 10.1 11.2* 9.9  --   --  17.7*  --   --  17.0* 14.7*  --  15.8*  NEUTROABS 9.6* 7.7 8.5* 6.6  --   --   --   --   --   --   --   --  13.4*  HGB 9.0* 9.7* 9.9* 8.5*  < >  --  9.5*  < > 9.2* 9.1* 9.0* 10.2* 9.6*  HCT 28.2* 30.0* 31.8* 27.5*  < >  --  29.1*  < > 27.0* 28.0* 27.5* 30.0* 28.9*  MCV 82.0 82.9 82.2 80.9  --   --  80.2  --   --  79.8 78.8  --  78.3  PLT 157 192 213 188  < > 111* 111*  --   --  124* 111*  --  123*  < > = values in this interval not  displayed.  INR:  Recent Labs Lab 06/10/17 0609 06/12/17 1234 06/12/17 1251 06/12/17 2137 06/13/17 0320  INR 0.99 1.36 1.44 1.28 1.23    Other results:  EKG:   Imaging: Dg Chest Port 1 View  Result Date: 06/13/2017 CLINICAL DATA:  ET tube, chest tube, LVAD EXAM: PORTABLE CHEST 1 VIEW COMPARISON:  06/12/2017 FINDINGS: Support devices are stable including LVAD and left chest tube. No pneumothorax. Cardiomegaly with vascular congestion. Low lung volumes. Bilateral perihilar and lower lobe airspace opacities, left greater than right. This could reflect edema and/or atelectasis. IMPRESSION: Bilateral airspace opacities, left greater than right could reflect edema/ CHF and/or atelectasis. No pneumothorax. Electronically Signed   By: Rolm Baptise M.D.   On: 06/13/2017 07:11   Dg Chest Port 1 View  Result Date: 06/12/2017 CLINICAL DATA:  Left ventricular assist device insertion. Acute on chronic heart failure. EXAM: PORTABLE CHEST 1 VIEW COMPARISON:  06/09/2017 FINDINGS: Endotracheal tube and NG tube and chest tubes appear in good position. AICD in place. Swan-Ganz catheter in the right main pulmonary artery. Left ventricular assist device in place. Heart size is normal. Slight pulmonary vascular congestion on the left. No pneumothorax. IMPRESSION: 1. Tubes in position as described. 2. No pneumothorax. 3. Slight pulmonary vascular congestion on the left. Electronically Signed   By: Lorriane Shire M.D.   On: 06/12/2017 13:11      Medications:     Scheduled Medications: . acetaminophen  1,000 mg Oral Q6H   Or  . acetaminophen (TYLENOL) oral liquid 160 mg/5 mL  1,000 mg Per Tube Q6H  . aspirin EC  325  mg Oral Daily   Or  . aspirin  324 mg Per Tube Daily   Or  . aspirin  300 mg Rectal Daily  . bisacodyl  10 mg Oral Daily   Or  . bisacodyl  10 mg Rectal Daily  . chlorhexidine  15 mL Mouth Rinse BID  . Chlorhexidine Gluconate Cloth  6 each Topical Daily  . digoxin  0.125 mg Oral  Daily  . docusate sodium  200 mg Oral Daily  . furosemide      . insulin aspart  0-24 Units Subcutaneous Q4H  . insulin aspart  3 Units Subcutaneous TID WC  . insulin detemir  8 Units Subcutaneous BID  . mouth rinse  15 mL Mouth Rinse q12n4p  . metoCLOPramide (REGLAN) injection  10 mg Intravenous Q6H  . mupirocin ointment   Nasal BID  . [START ON 06/14/2017] pantoprazole  40 mg Oral Daily  . sodium chloride flush  10-40 mL Intracatheter Q12H  . sodium chloride flush  3 mL Intravenous Q12H  . warfarin  2.5 mg Oral q1800  . Warfarin - Physician Dosing Inpatient   Does not apply q1800     Infusions: . sodium chloride 20 mL/hr at 06/13/17 1500  . sodium chloride Stopped (06/13/17 1100)  . sodium chloride Stopped (06/13/17 1300)  . amiodarone 30 mg/hr (06/13/17 1500)  . cefUROXime (ZINACEF)  IV Stopped (06/13/17 1436)  . DOPamine 3 mcg/kg/min (06/13/17 1500)  . EPINEPHrine 4 mg in dextrose 5% 250 mL infusion (16 mcg/mL) 3.013 mcg/min (06/13/17 1500)  . furosemide (LASIX) infusion 8 mg/hr (06/13/17 1500)  . heparin 800 Units/hr (06/13/17 1500)  . insulin (NOVOLIN-R) infusion Stopped (06/13/17 1300)  . lactated ringers 10 mL/hr at 06/13/17 1500  . lactated ringers 10 mL/hr at 06/13/17 1500  . milrinone 0.375 mcg/kg/min (06/13/17 1501)  . nitroGLYCERIN 16.667 mcg/min (06/13/17 1500)  . norepinephrine (LEVOPHED) Adult infusion 4 mcg/min (06/13/17 1500)  . vancomycin 1,000 mg (06/13/17 1502)     PRN Medications:  sodium chloride, midazolam, morphine injection, ondansetron (ZOFRAN) IV, oxyCODONE, sodium chloride flush, sodium chloride flush, traMADol   Assessment:  HeartMate 3 VAD implant for ischemic cardiomyopathy Preoperative ventricular ectopy with permanent pacemaker-AICD Preoperative moderate chronic kidney disease stage III Preoperative hemicolectomy for granulomatous colitis Poorly controlled diabetes mellitus with hemoglobin A1c 10.0, neuropathy Peripheral vascular  disease with ABIs 0.5 bilaterally   Plan/Discussion:    Continue current low-dose inotropes including low-dose norepinephrine, dopamine, epinephrine. Continue 0.375 milrinone for RV function  Lasix drip 8 mg/m, evidence of acute on chronic renal insufficiency with low urine output and fluid retention and poor response to Lasix  Start low-dose IV heparin for previous history of DVT and factor V Leiden deficiency. Oral Coumadin dosing started  Retained PA catheter today but will remove tomorrow and mobilize out of bed to chair  I reviewed the LVAD parameters from today, and compared the results to the patient's prior recorded data.  No programming changes were made.  The LVAD is functioning within specified parameters.  The patient performs LVAD self-test daily.  LVAD interrogation was negative for any significant power changes, alarms or PI events/speed drops.  LVAD equipment check completed and is in good working order.  Back-up equipment present.   LVAD education done on emergency procedures and precautions and reviewed exit site care.  Length of Stay: [place] III 06/13/2017, 3:29 PM

## 2017-06-13 NOTE — Procedures (Signed)
Extubation Procedure Note  Patient Details:   Name: [name] DOB: 1966-04-26 MRN: [id number]   Airway Documentation:     Evaluation  O2 sats: stable throughout Complications: No apparent complications Patient did tolerate procedure well. Bilateral Breath Sounds: Other (Comment) (coarse)   Yes   Patient tolerated rapid wean. NIF -22 and VC 0.75 L. Positive for cuff leak. Patient extubated to a 4 Lpm [place] with 4 ppm of NO bled in. No signs of dyspnea or stridor. Patient instructed on the Incentive Spirometer achieving 420m five times. Patient resting comfortably. RN at bedside.   SMyrtie Neither10/05/2017, 9:57 AM

## 2017-06-13 NOTE — Progress Notes (Signed)
Patient ID: [name], male   DOB: 1965-10-22, 51 y.o.   MRN: [id number]   Advanced Heart Failure VAD Team Note  Subjective:    Admitted 10/3 for RHC/Swan implantation and optimization prior to LVAD.  He was started on milrinone 0.125 and had multiple teeth removed.  Creatinine improved with milrinone.   HeartMate 3 VAD implanted 10/8.  No early surgical complications.   Frequent ventricular ectopy noted post op, amiodarone begun.   Off norepi. Remains on epinephrine at 3, dopamine at 3, and milrinone 0.375  mcg/kg/min with CI 2.27.  Dopamine 3 mcg/min started last night with improved urine output and CI.   Doing well this am. Sore, but awake and alert on vent. Asking for pen to write. Denies SOB.   Hgb 9.6. WBC 15.8. Afebrile.  Creatinine 1.40 -> 1.62.  Up 23 lbs from pre-op.  CXR 06/13/17 am with L>R edema/CHF.  Swan numbers CVP 11 PA 32/14 (20) CO 4.56 CI 2.27  LVAD INTERROGATION:  HeartMate 3:  Flow 4.0 liters/min, speed 5400 rpm, power 4.0, PI 1.9. 30+ PI events.  Objective:    Vital Signs:   Temp:  [96.6 F (35.9 C)-97.7 F (36.5 C)] 97.5 F (36.4 C) (10/09 0645) Pulse Rate:  [30-117] 86 (10/09 0645) Resp:  [12-29] 12 (10/09 0645) BP: (78-122)/(55-99) 85/66 (10/09 0600) SpO2:  [98 %-100 %] 100 % (10/09 0645) FiO2 (%):  [40 %-50 %] 40 % (10/09 0405) Weight:  [217 lb 13 oz (98.8 kg)] 217 lb 13 oz (98.8 kg) (10/09 0530) Last BM Date: 06/21/17 Mean arterial Pressure 70s  Intake/Output:   Intake/Output Summary (Last 24 hours) at 06/13/17 0725 Last data filed at 06/13/17 0645  Gross per 24 hour  Intake          8165.24 ml  Output             3025 ml  Net          5140.24 ml     Physical Exam    General: Intubated, but awake and alert.  HEENT: + ETT Neck: Supple. JVP 10-11. Carotids 2+ bilat; no bruits. No thyromegaly or nodule noted. Cor: PMI nondisplaced. Regular, No M/G/R noted Lungs: Diminished basilar sounds. + VAD hum Abdomen: Soft,  non-tender, non-distended, no HSM. No bruits or masses. +BS  LVAD exit site: Dressing dry and intact. Stabilization device present and accurately applied. Driveline dressing changed daily per sterile technique. Extremities: No cyanosis, clubbing, or rash. Trace ankle edema.  Neuro: Intubated. Nods to questioning, opens eyes.  Telemetry   A-paced at 90 bpm, personally reviewed.  Minimal ventricular ectopy.   Labs   Basic Metabolic Panel:  Recent Labs Lab 06/09/17 1632 06/10/17 0609 06/11/17 0636 06/12/17 0210  06/12/17 1135  06/12/17 1300 06/12/17 1827 06/12/17 1830 06/12/17 2259 06/12/17 2306 06/13/17 0320  NA  --  137 136 133*  < > 139  < > 139 136 134*  --  137 134*  K  --  4.2 4.1 3.6  < > 3.1*  < > 3.1* 4.0 3.9  --  3.8 3.9  CL  --  103 100* 99*  < > 100*  --   --  101 104  --  100* 103  CO2  --  _0 --   --   --   --   --  22  --   --  22  GLUCOSE  --  128* 136* 118*  < > 145*  --  111* 149* 141*  --  99 128*  BUN  --  31* 27* 25*  < > 21*  --   --  19 18  --  20 19  CREATININE  --  1.57* 1.68* 1.54*  < > 1.10  --   --  1.20 1.34* 1.52* 1.40* 1.62*  CALCIUM  --  8.6* 8.6* 8.1*  --   --   --   --   --  8.0*  --   --  8.3*  MG 2.1 2.1  --   --   --   --   --   --   --  2.9* 2.7*  --  2.5*  PHOS  --   --   --   --   --   --   --   --   --   --   --   --  3.1  < > = values in this interval not displayed.  Liver Function Tests:  Recent Labs Lab 06/09/17 0516 06/10/17 0609 06/11/17 0636 06/12/17 0210 06/13/17 0320  AST 83* 54* 41 31 89*  ALT 65* 59 48 36 24  ALKPHOS 101 101 117 98 71  BILITOT 0.5 0.7 0.7 0.5 2.2*  PROT 6.3* 7.0 7.2 6.0* 5.7*  ALBUMIN 2.7* 3.0* 3.1* 2.6* 3.4*   No results for input(s): LIPASE, AMYLASE in the last 168 hours. No results for input(s): AMMONIA in the last 168 hours.  CBC:  Recent Labs Lab 06/09/17 0516 06/10/17 0609 06/11/17 0636 06/12/17 0210  06/12/17 1234 06/12/17 1251  06/12/17 1827 06/12/17 1830 06/12/17 2259  06/12/17 2306 06/13/17 0320  WBC 11.6* 10.1 11.2* 9.9  --   --  17.7*  --   --  17.0* 14.7*  --  15.8*  NEUTROABS 9.6* 7.7 8.5* 6.6  --   --   --   --   --   --   --   --  13.4*  HGB 9.0* 9.7* 9.9* 8.5*  < >  --  9.5*  < > 9.2* 9.1* 9.0* 10.2* 9.6*  HCT 28.2* 30.0* 31.8* 27.5*  < >  --  29.1*  < > 27.0* 28.0* 27.5* 30.0* 28.9*  MCV 82.0 82.9 82.2 80.9  --   --  80.2  --   --  79.8 78.8  --  78.3  PLT 157 192 213 188  < > 111* 111*  --   --  124* 111*  --  123*  < > = values in this interval not displayed.  INR:  Recent Labs Lab 06/10/17 0609 06/12/17 1234 06/12/17 1251 06/12/17 2137 06/13/17 0320  INR 0.99 1.36 1.44 1.28 1.23    Other results:  EKG:    Imaging   Dg Chest Port 1 View  Result Date: 06/13/2017 CLINICAL DATA:  ET tube, chest tube, LVAD EXAM: PORTABLE CHEST 1 VIEW COMPARISON:  06/12/2017 FINDINGS: Support devices are stable including LVAD and left chest tube. No pneumothorax. Cardiomegaly with vascular congestion. Low lung volumes. Bilateral perihilar and lower lobe airspace opacities, left greater than right. This could reflect edema and/or atelectasis. IMPRESSION: Bilateral airspace opacities, left greater than right could reflect edema/ CHF and/or atelectasis. No pneumothorax. Electronically Signed   By: Rolm Baptise M.D.   On: 06/13/2017 07:11   Dg Chest Port 1 View  Result Date: 06/12/2017 CLINICAL DATA:  Left ventricular assist device insertion. Acute on chronic heart failure. EXAM: PORTABLE CHEST 1 VIEW COMPARISON:  06/09/2017 FINDINGS: Endotracheal tube  and NG tube and chest tubes appear in good position. AICD in place. Swan-Ganz catheter in the right main pulmonary artery. Left ventricular assist device in place. Heart size is normal. Slight pulmonary vascular congestion on the left. No pneumothorax. IMPRESSION: 1. Tubes in position as described. 2. No pneumothorax. 3. Slight pulmonary vascular congestion on the left. Electronically Signed   By: Lorriane Shire  M.D.   On: 06/12/2017 13:11     Medications:     Scheduled Medications: . acetaminophen  1,000 mg Oral Q6H   Or  . acetaminophen (TYLENOL) oral liquid 160 mg/5 mL  1,000 mg Per Tube Q6H  . aspirin EC  325 mg Oral Daily   Or  . aspirin  324 mg Per Tube Daily   Or  . aspirin  300 mg Rectal Daily  . bisacodyl  10 mg Oral Daily   Or  . bisacodyl  10 mg Rectal Daily  . chlorhexidine gluconate (MEDLINE KIT)  15 mL Mouth Rinse BID  . Chlorhexidine Gluconate Cloth  6 each Topical Daily  . digoxin  0.125 mg Oral Daily  . docusate sodium  200 mg Oral Daily  . insulin regular  0-10 Units Intravenous TID WC  . mouth rinse  15 mL Mouth Rinse QID  . metoCLOPramide (REGLAN) injection  10 mg Intravenous Q6H  . [START ON 06/14/2017] pantoprazole  40 mg Oral Daily  . rifampin  600 mg Oral Once  . sodium chloride flush  10-40 mL Intracatheter Q12H  . sodium chloride flush  3 mL Intravenous Q12H    Infusions: . sodium chloride 20 mL/hr at 06/13/17 0600  . sodium chloride 250 mL (06/13/17 0508)  . sodium chloride 10 mL/hr at 06/13/17 0000  . albumin human Stopped (06/12/17 2123)  . amiodarone 30 mg/hr (06/13/17 0600)  . cefUROXime (ZINACEF)  IV Stopped (06/13/17 0230)  . dexmedetomidine (PRECEDEX) IV infusion Stopped (06/13/17 0645)  . DOPamine 3 mcg/kg/min (06/13/17 0600)  . EPINEPHrine 4 mg in dextrose 5% 250 mL infusion (16 mcg/mL) 3 mcg/min (06/13/17 0600)  . insulin (NOVOLIN-R) infusion 1.4 Units/hr (06/13/17 0600)  . lactated ringers 20 mL/hr at 06/13/17 0600  . lactated ringers 20 mL/hr at 06/13/17 0600  . milrinone 0.375 mcg/kg/min (06/13/17 0600)  . nitroGLYCERIN 23.33 mcg/min (06/13/17 0600)  . norepinephrine (LEVOPHED) Adult infusion Stopped (06/12/17 2231)  . vancomycin Stopped (06/13/17 0116)    PRN Medications: sodium chloride, albumin human, midazolam, morphine injection, ondansetron (ZOFRAN) IV, oxyCODONE, sodium chloride flush, sodium chloride flush,  traMADol   Patient Profile   51 yo with history of CAD s/p anterior MI in 2010 and chronic systolic CHF (ischemic cardiomyopathy) as well as CKD stage III now s/p HeartMate 3 VAD placement.   Assessment/Plan:    1. Acute on chronic systolic CHF: Ischemic cardiomyopathy. St Jude ICD. NSTEMI in March 2018 with DES to LAD and RCA, complicated by cardiogenic shock, low output requiring milrinone. Most recent echo 8/18 with EF 20-25%, moderate MR. NYHA class IIIb-IVsymptoms (fluctuate).He was admitted and started on milrinone with improved creatinine.  HeartMate 3 VAD placement on 10/8. Stable post-op, currently on milrinone 0.375 (turned up post-op with low CI), dopamine 3, and epinephrine 2.  MAP 70s. Titrating off NO. CI 2.3 today with co-ox 76%.  He is volume overloaded with pulmonary edema on CXR.  - Continue current milrinone, slow titration off dopamine/epinephrine.  - Start Lasix 8 mg/hr for diuresis.  - Hopefully extubate today.   - Start warfarin with INR  goal 2-3 (aim slightly higher with h/o Factor V Leiden heterozygosity) and ASA 325. Surgery to decide on when to start heparin gtt while INR subtherapeutic.  2. CKD stage 3: Mild bump this am. Continue to follow closely with diuresis.  3. CAD: NSTEMI in 3/18. LHC with 99% ulcerated lesion proximal RCA with left to right collaterals, 95% mid LAD stenosis after mid LAD stent. s/p PCI to RCA and LAD on 11/25/16.  - Off plavix for LVAD.  - Continue high intensity statin, good lipids recently. No change.  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 Recent V/Q scan did not show acute or chronic PE.  - He is a heterozygote for Factor V Leiden, plan for for ASA 325 and warfarin INR 2-3 with LVAD. No change.  5. Chronic N/V/D with gastroparesis: Follows with GI. Quiescent currently.  6. DM: On Jardiance at home, will cover with sliding scale> DM coordinator has seen and increased insulin.  No change.  7. S/P Multiple Tooth Extractions  4-15, 19-23, and 27-29. Stable. 8. Rhythm: Frequent ventricular ectopy.  - Continue amiodarone gtt for now. Will try and stop amio prior to discharge.   I reviewed the LVAD parameters from today, and compared the results to the patient's prior recorded data.  No programming changes were made.  The LVAD is functioning within specified parameters.  The patient performs LVAD self-test daily.  LVAD interrogation was negative for any significant power changes, alarms or PI events/speed drops.  LVAD equipment check completed and is in good working order.  Back-up equipment present.   LVAD education done on emergency procedures and precautions and reviewed exit site care.   Length of Stay: [address]  Annamaria Helling 06/13/2017, 7:25 AM  VAD Team --- VAD ISSUES ONLY--- Pager [phone] (7am - 7am)  Advanced Heart Failure Team  Pager [phone] (M-F; 7a - 4p)  Please contact [facility] for night-coverage after hours (4p -7a ) and weekends on [email or web address]  Patient seen with PA, agree with the above note.  I have performed an independent exam and assessment and adjusted the above note to fully reflect my thoughts.   Good cardiac output this morning on current support, stable MAP.  He is awake/alert, tapering off NO.  Pulmonary edema on CXR, weight is up.  Creatinine 1.6 which is about where he was pre-op. Good flow on HM3 pump this morning.  - Continue milrinone, taper off dopamine/epinephrine slowly.  - Start Lasix 8 mg/hr.   - Hopefully extubate today.  - Starting warfarin, on ASA 325.  Start heparin gtt when deemed safe to do this by surgery.   Ventricular ectopy quiescent on amiodarone gtt.   CRITICAL CARE Performed by: Loralie Champagne  Total critical care time: 35 minutes  Critical care time was exclusive of separately billable procedures and treating other patients.  Critical care was necessary to treat or prevent imminent or life-threatening deterioration.  Critical care was time  spent personally by me on the following activities: development of treatment plan with patient and/or surrogate as well as nursing, discussions with consultants, evaluation of patient's response to treatment, examination of patient, obtaining history from patient or surrogate, ordering and performing treatments and interventions, ordering and review of laboratory studies, ordering and review of radiographic studies, pulse oximetry and re-evaluation of patient's condition.  English Craighead 06/13/2017 8:33 AM

## 2017-06-13 NOTE — Op Note (Addendum)
NAME:  [name], [name]                 ACCOUNT NO.:  MEDICAL RECORD NO.:  [id number]  LOCATION:                                 FACILITY:  PHYSICIAN:  Ivin Poot, M.D.  DATE OF BIRTH:  October 06, 1965  DATE OF PROCEDURE:  06/12/2017 DATE OF DISCHARGE:                              OPERATIVE REPORT   OPERATION:  Implantation of HeartMate 3 left ventricular assist device.  SURGEON:  Ivin Poot, MD.  ASSISTING SURGEON:  Gilford Raid, MD.  ANESTHESIA:  General by Dr. Roberts Gaudy.  PREOPERATIVE DIAGNOSES:  Ischemic cardiomyopathy, class 4 congestive heart failure, preoperative inotrope support with milrinone.  POSTOPERATIVE DIAGNOSES:  Milrinone  DESCRIPTION OF PROCEDURE:  After the patient had been admitted and optimized by the Heart Failure Cardiology Service, I examined the patient on several occasions and discussed the role of HeartMate 3 implantation as destination therapy for his end-stage heart disease from ischemic cardiomyopathy with ejection fraction of 20-25%.  I reviewed with the patient the benefits of the assist device including improved survival, improved quality of life, and reduction in symptoms of heart failure.  I reviewed the risks to him of the operation including risk of stroke, MI, bleeding, blood transfusion requirement, postoperative pulmonary problems including pleural effusion, infection, organ failure, and death.  He demonstrated his understanding of these issues and agreed to proceed with surgery under what I felt was an informed consent.  DESCRIPTION OF PROCEDURE:  The patient was brought directly from the preop holding to the operating room and placed supine on the operating table.  General anesthesia was induced.  The patient remained stable.  A transesophageal echo probe was placed by the anesthesia team.  The patient remained stable.  The chest, abdomen, and legs were prepped with Betadine and draped as a sterile field.  A sternal  incision was made. The sternum was retracted and the pericardium was opened.  The heart was enlarged.  The LV was dilated and hypocontractile.  The RV appeared to be contracting fairly normally.  Heparin was administered.  Pursestrings were placed in the ascending aorta and right atrium.  A small counterincision was made in the left mid abdomen for the power cord tunnel and the exit site out under the left costal margin.  When the ACT was documented as being therapeutic, the patient was cannulated and placed on cardiopulmonary bypass.  The pulmonary artery was dissected from the aorta and CO2 was insufflated into the operative field.  With the heart empty and beating, the left ventricle was elevated on several lap pads.  A small pocket had also been performed by partially taking down the left hemidiaphragm and opening the left pleural space. The apex was identified and an incision was made with 11 blade, directed towards the mitral valve anulus and away from the interventricular septum.  The cutting device was then used to make the apical opening. Trabeculae were trimmed under direct vision.  Two Ethibond pledgeted sutures numbering 12 were placed around the opening in the LV apex. These were then placed through the sewing ring for the HeartMate 3 pump and the sutures were then tied and secured.  A fine layer of  medical adhesive, Coseal was placed over the sutures.  Next, the HeartMate 3 pump was brought to the field and the connections were secured.  As volume was left into the heart to avoid entrapment of air, the inflow cannula was inserted in the sewing ring and the connecting device was closed with an audible click.  The bend relief guard was attached, and the pump was then placed in the pericardium in an optimal position for the power cord and outflow graft.  The power cord was pulled through the incision in the abdomen and out the left subcostal area.  Next, a partial clamp was  placed on the ascending aorta.  An aortotomy was made for the outflow graft, which was trimmed to the appropriate length and beveled into the appropriate angle.  It was then sewn end-to- side with running 4-0 Prolene and covered with a light layer of medical adhesive - Coseal.  Air was evacuated from the graft as the aortic clamp was removed with a 20-gauge needle.  The patient had been rewarmed.  Low-dose milrinone and norepinephrine were started.  Temporary pacing wires were applied.  The lungs were expanded.  The ventilator was resumed with the inhaled nitric oxide. The patient was then weaned from cardiopulmonary bypass after observing the heart and slowly increasing the pump RPMs as we transitioned off cardiopulmonary bypass.  We came off cardiopulmonary bypass with a pump flow of 5100 RPMs.  Echo showed aortic valve opening and the pump speed was gradually increased to 5300 RPMs with good flows of the pump, good mean blood pressure, and adequate cardiac output.  Protamine was administered without adverse reaction.  The cannulas were removed.  Air had been vented from the heart with a Magoon_ needle in the ascending aorta, which was repaired with pledgeted 4-0 Prolene.  Heparin was reversed with protamine without adverse reaction.  The superior pericardial fat was closed over the aorta.  A piece of Gore-Tex membrane was placed over the outflow graft, which delayed laterally to the right side over the right atrium.  Anterior mediastinal, left pleural, and pocket drains were placed and brought out through separate incisions.  The sternum was closed with wire.  The patient remained stable.  The pectoralis fascia was closed with a running #1 Vicryl, and subcutaneous and skin layers were closed in a running Vicryl.  The counterincision of the abdomen was closed in layers using Vicryl, and the exit site was secured with subcutaneous Vicryl and 2 nylon skin sutures.  Sterile dressings  were applied.  The patient was then transported back to the ICU in a hemodynamically stable condition.  Total cardiopulmonary bypass time was 130 minutes.     Ivin Poot, M.D.     PV/MEDQ  D:  06/12/2017  T:  06/13/2017  Job:  [id number]

## 2017-06-13 NOTE — Addendum Note (Signed)
Addendum  created 06/13/17 1425 by Roberts Gaudy, MD   Sign clinical note

## 2017-06-13 NOTE — Progress Notes (Signed)
Patient ID: [name], male   DOB: Mar 22, 1966, 51 y.o.   MRN: [id number] TCTS Evening Rounds:  He has had a fairly stable day overall.  MAP 70's. PA 62/18. CI 3.3 Rhythm is sinus 90's  Co-ox 61 this afternoon on dop 3, epi 3, levophed 2, milrinone 0.375, NO 4 ppm VAD parameters look good.  Lasix drip at 8 but uo only 20 cc per hour. Creatinine up to 2.0. Will give a dose of metolazone tonight.  He is awake and alert.  BMET    Component Value Date/Time   NA 133 (L) 06/13/2017 1548   NA 144 05/11/2017 0938   K 5.2 (H) 06/13/2017 1548   CL 99 (L) 06/13/2017 1548   CO2 22 06/13/2017 0320   GLUCOSE 175 (H) 06/13/2017 1548   BUN 21 (H) 06/13/2017 1548   BUN 68 (H) 05/11/2017 0938   CREATININE 2.08 (H) 06/13/2017 1700   CREATININE 1.63 (H) 11/09/2016 1505   CALCIUM 8.3 (L) 06/13/2017 0320   GFRNONAA 35 (L) 06/13/2017 1700   GFRNONAA 48 (L) 11/09/2016 1505   GFRAA 40 (L) 06/13/2017 1700   GFRAA 55 (L) 11/09/2016 1505   CBC    Component Value Date/Time   WBC 24.4 (H) 06/13/2017 1700   RBC 3.65 (L) 06/13/2017 1700   HGB 9.6 (L) 06/13/2017 1700   HGB 11.7 (L) 09/07/2016 1457   HCT 28.8 (L) 06/13/2017 1700   HCT 37.5 09/07/2016 1457   PLT 133 (L) 06/13/2017 1700   PLT 279 09/07/2016 1457   MCV 78.9 06/13/2017 1700   MCV 81 09/07/2016 1457   MCH 26.3 06/13/2017 1700   MCHC 33.3 06/13/2017 1700   RDW 15.2 06/13/2017 1700   RDW 14.9 09/07/2016 1457   LYMPHSABS 1.0 06/13/2017 0320   LYMPHSABS 1.3 09/07/2016 1457   MONOABS 0.9 06/13/2017 0320   EOSABS 0.5 06/13/2017 0320   EOSABS 0.2 09/07/2016 1457   BASOSABS 0.0 06/13/2017 0320   BASOSABS 0.0 09/07/2016 1457

## 2017-06-13 NOTE — Progress Notes (Signed)
Anesthesiology Follow-up:  Awake and alert, neuro intact, extubated at 09:00 today.   VS: T-36.5 HR-80 BP- 89/72 RR- 26 O2 Sat 97% on 3L  PA -45/23 CVP-10 CO/CI- 4.2/2.1  Milrinone 0.375 mcg/kg/min Norepi 4 mcg/kg/min Epinephrine 3 mcg/kg/min Dopamine 3 mcg/kg/min  Na- 134 K- 3.9 BUN/Cr.- 19/1.62 H/H- 9.6/28.9 Platelets- 123,000  VAD parameters: RPM: 5400  Pump Flow: 4.3 LPM Power: 4 Watts PI: 2.4  Amiodarone started yesterday for ectopy  51 year old male 1 day S/P Heartmate 3 insertion for  end-stage ischemic cardiomyopathy, renal function stable, doing well so far. No apparent comp;ications.  [name]

## 2017-06-13 NOTE — Progress Notes (Signed)
CSW met at bedside with patient, mother and friend. Patient extubated and talking with CSW at bedside. Patient's family notice improvement today over yesterday and asking appropriate questions about plan and recovery. CSW will continue to follow for support throughout implant hospitalization. Raquel Sarna, [place], [place]

## 2017-06-13 NOTE — Progress Notes (Signed)
Stopped in to visit with patient this morning since extubation.  Patient whispered hello.  Also met mother at bedside.  Patient grabbed my hand and I asked if it would be alright to pray.  Patient stated "yes please".  Chaplain prayed with patient.  Chaplain will continue to provide follow up for this patient and family.   Thank you to the medical team and other support teams for caring for this patient and family.  Please page Chaplain should additional support be needed.    06/13/17 0950  Clinical Encounter Type  Visited With Patient;Family  Visit Type Follow-up;Spiritual support;Social support;Post-op  Spiritual Encounters  Spiritual Needs Prayer

## 2017-06-13 NOTE — Progress Notes (Signed)
LVAD Coordinator Rounding Note:  Admitted 06/07/2017 for hemodynamic optimization prior to LVAD placement. Full dental extractions prior to LVAD implant.  HM3 LVAD implated on 06/12/2017 by Dr Prescott Gum under Destination Therapy criteria.  Vital signs: HR: 96 Doppler Pressure: 80 Automatic cuff: 101/90 (96) O2 Sat: 100% on 40% Fi02 NO: 4 PPM Wt:194>217 lbs     LVAD interrogation reveals:   [id number] Flow: 4.1 Power:  3.9 PI: 2.9 Alarms: none Events:  none Hematocrit: 29 Fixed speed: 5400 Low speed limit: 5100  10/8>  61 PI events 10/8> 1 low flow alarm  Drive Line: Daily. Change per protocol using daily kits and silver strip.   Labs:  LDH trend:255>343  INR trend: 1.44>1.23  Anticoagulation Plan: -INR Goal: 2-2.5, warfarin resumed 10/9 -ASA Dose: 35m (will remain on this with factor V heterozygous)  Blood Products:  - 2 units FFP in OR - 1 unit Platelets in OR  Gtts: - Amiodarone at 30 mg/hr - Dopamine at 3 mcg/kg/min - Epinephrine 3 mcg/min - Insulin - Milrinone 0.375 mcg/kg/min - Lasix @ 8 mg/hr -Heparin per pharmacy - nitroglycerin 23 mcg/min  Device: -St Jude  -Therapies: off. Perm pacer set at 60 bpm.  - Temp pacer set  AAI at 88 bpm, MA-5  Arrythmias: frequent ventricular ectopy  Respiratory: currently intubated. SBT now.  Renal:  -BUN/CRT: 21/1.10, 19/1.62   Adverse Events on VAD: -none  Plan/Recommendations:   1. Please change drive-line dressing daily using daily dressing kits and silver strips. 2. Please page VAD pager with any equipment concerns.  LBalinda QuailsRN, VAD Coordinator 24/7 pager [phone]

## 2017-06-13 NOTE — Progress Notes (Signed)
PT Cancellation Note  Patient Details Name: [name] MRN: [id number] DOB: 12-05-1965   Cancelled Treatment:    Reason Eval/Treat Not Completed: Other (comment) (orders received, to begin next date. Will plan for eval 10/10 per orders)   [name] B [name] 06/13/2017, 7:23 AM [name], [name]

## 2017-06-13 NOTE — Care Management Note (Addendum)
Case Management Note  Patient Details  Name: LENON KUENNEN MRN: [id number] Date of Birth: 03-27-1966  Subjective/Objective:  From home, post op LVAD, conts on vent.   10/10 Pierce, BSN - POD2 LVAD implant, extubated, conts on lasix drip, inotropes, norepi, dopamine and epi.  PT eval rec HHPT , NCM will cont to follow progression.      10/19 [name], BSN - milrinone stopped today, aim for dc Monday or Tuesday, will need Petrey set up next week.   10/22 [name] RN, BSN - conts to be oliguric with rising CVP, conts on heparin drip, s/p tunneled catheter and av fistula today for HD.  Plan is home with [facility] when stable.                Action/Plan: Post of LVAD, NCM will cont to follow progression.   Expected Discharge Date:                  Expected Discharge Plan:     In-House Referral:     Discharge planning Services  CM Consult  Post Acute Care Choice:    Choice offered to:     DME Arranged:    DME Agency:     HH Arranged:    HH Agency:     Status of Service:  In process, will continue to follow  If discussed at Long Length of Stay Meetings, dates discussed:    Additional Comments:  Zenon Mayo, RN 06/13/2017, 10:57 AM

## 2017-06-13 NOTE — Addendum Note (Signed)
Addendum  created 06/13/17 1043 by Roberts Gaudy, MD   Pend clinical note

## 2017-06-14 ENCOUNTER — Encounter (HOSPITAL_COMMUNITY): Payer: BLUE CROSS/BLUE SHIELD

## 2017-06-14 ENCOUNTER — Inpatient Hospital Stay (HOSPITAL_COMMUNITY): Payer: BLUE CROSS/BLUE SHIELD

## 2017-06-14 DIAGNOSIS — N170 Acute kidney failure with tubular necrosis: Secondary | ICD-10-CM

## 2017-06-14 LAB — COMPREHENSIVE METABOLIC PANEL
ALT: 24 U/L (ref 17–63)
AST: 78 U/L — ABNORMAL HIGH (ref 15–41)
Albumin: 3.2 g/dL — ABNORMAL LOW (ref 3.5–5.0)
Alkaline Phosphatase: 83 U/L (ref 38–126)
Anion gap: 12 (ref 5–15)
BUN: 25 mg/dL — ABNORMAL HIGH (ref 6–20)
CO2: 21 mmol/L — ABNORMAL LOW (ref 22–32)
Calcium: 8.4 mg/dL — ABNORMAL LOW (ref 8.9–10.3)
Chloride: 97 mmol/L — ABNORMAL LOW (ref 101–111)
Creatinine, Ser: 2.75 mg/dL — ABNORMAL HIGH (ref 0.61–1.24)
GFR calc Af Amer: 29 mL/min — ABNORMAL LOW (ref >60)
GFR calc non Af Amer: 25 mL/min — ABNORMAL LOW (ref >60)
Glucose, Bld: 220 mg/dL — ABNORMAL HIGH (ref 65–99)
Potassium: 4.9 mmol/L (ref 3.5–5.1)
Sodium: 130 mmol/L — ABNORMAL LOW (ref 135–145)
Total Bilirubin: 2.5 mg/dL — ABNORMAL HIGH (ref 0.3–1.2)
Total Protein: 6.3 g/dL — ABNORMAL LOW (ref 6.5–8.1)

## 2017-06-14 LAB — GLUCOSE, CAPILLARY
GLUCOSE-CAPILLARY: 184 mg/dL — AB (ref 65–99)
GLUCOSE-CAPILLARY: 198 mg/dL — AB (ref 65–99)
Glucose-Capillary: 141 mg/dL — ABNORMAL HIGH (ref 65–99)
Glucose-Capillary: 167 mg/dL — ABNORMAL HIGH (ref 65–99)
Glucose-Capillary: 178 mg/dL — ABNORMAL HIGH (ref 65–99)
Glucose-Capillary: 219 mg/dL — ABNORMAL HIGH (ref 65–99)
Glucose-Capillary: 96 mg/dL (ref 65–99)
Glucose-Capillary: 98 mg/dL (ref 65–99)

## 2017-06-14 LAB — APTT
aPTT: 54 seconds — ABNORMAL HIGH (ref 24–36)
aPTT: 49 seconds — ABNORMAL HIGH (ref 24–36)

## 2017-06-14 LAB — BLOOD GAS, ARTERIAL
Acid-base deficit: 3.5 mmol/L — ABNORMAL HIGH (ref 0.0–2.0)
Bicarbonate: 22 mmol/L (ref 20.0–28.0)
Drawn by: 418751
O2 Content: 4 L/min
O2 Saturation: 92.1 %
Patient temperature: 98.6
pCO2 arterial: 46.8 mmHg (ref 32.0–48.0)
pH, Arterial: 7.294 — ABNORMAL LOW (ref 7.350–7.450)
pO2, Arterial: 71.1 mmHg — ABNORMAL LOW (ref 83.0–108.0)

## 2017-06-14 LAB — CBC WITH DIFFERENTIAL/PLATELET
Basophils Absolute: 0 10*3/uL (ref 0.0–0.1)
Basophils Relative: 0 %
Eosinophils Absolute: 0 10*3/uL (ref 0.0–0.7)
Eosinophils Relative: 0 %
HCT: 27 % — ABNORMAL LOW (ref 39.0–52.0)
Hemoglobin: 8.8 g/dL — ABNORMAL LOW (ref 13.0–17.0)
Lymphocytes Relative: 3 %
Lymphs Abs: 0.8 10*3/uL (ref 0.7–4.0)
MCH: 25.8 pg — ABNORMAL LOW (ref 26.0–34.0)
MCHC: 32.6 g/dL (ref 30.0–36.0)
MCV: 79.2 fL (ref 78.0–100.0)
Monocytes Absolute: 2.3 10*3/uL — ABNORMAL HIGH (ref 0.1–1.0)
Monocytes Relative: 9 %
Neutro Abs: 22.6 10*3/uL — ABNORMAL HIGH (ref 1.7–7.7)
Neutrophils Relative %: 88 %
Platelets: 125 10*3/uL — ABNORMAL LOW (ref 150–400)
RBC: 3.41 MIL/uL — ABNORMAL LOW (ref 4.22–5.81)
RDW: 15.4 % (ref 11.5–15.5)
WBC: 25.7 10*3/uL — ABNORMAL HIGH (ref 4.0–10.5)

## 2017-06-14 LAB — POCT I-STAT 3, ART BLOOD GAS (G3+)
ACID-BASE DEFICIT: 4 mmol/L — AB (ref 0.0–2.0)
BICARBONATE: 22.5 mmol/L (ref 20.0–28.0)
O2 SAT: 94 %
TCO2: 24 mmol/L (ref 22–32)
pCO2 arterial: 44.6 mmHg (ref 32.0–48.0)
pH, Arterial: 7.307 — ABNORMAL LOW (ref 7.350–7.450)
pO2, Arterial: 75 mmHg — ABNORMAL LOW (ref 83.0–108.0)

## 2017-06-14 LAB — POCT I-STAT, CHEM 8
BUN: 29 mg/dL — AB (ref 6–20)
CREATININE: 3.6 mg/dL — AB (ref 0.61–1.24)
Calcium, Ion: 1.14 mmol/L — ABNORMAL LOW (ref 1.15–1.40)
Chloride: 95 mmol/L — ABNORMAL LOW (ref 101–111)
Glucose, Bld: 148 mg/dL — ABNORMAL HIGH (ref 65–99)
HEMATOCRIT: 30 % — AB (ref 39.0–52.0)
Hemoglobin: 10.2 g/dL — ABNORMAL LOW (ref 13.0–17.0)
POTASSIUM: 4.5 mmol/L (ref 3.5–5.1)
Sodium: 131 mmol/L — ABNORMAL LOW (ref 135–145)
TCO2: 24 mmol/L (ref 22–32)

## 2017-06-14 LAB — CALCIUM, IONIZED: CALCIUM, IONIZED, SERUM: 4.8 mg/dL (ref 4.5–5.6)

## 2017-06-14 LAB — PHOSPHORUS: Phosphorus: 5.9 mg/dL — ABNORMAL HIGH (ref 2.5–4.6)

## 2017-06-14 LAB — HEPARIN LEVEL (UNFRACTIONATED): Heparin Unfractionated: <0.1 IU/mL — ABNORMAL LOW (ref 0.30–0.70)

## 2017-06-14 LAB — COOXEMETRY PANEL
Carboxyhemoglobin: 0.8 % (ref 0.5–1.5)
Carboxyhemoglobin: 0.6 % (ref 0.5–1.5)
Methemoglobin: 1.8 % — ABNORMAL HIGH (ref 0.0–1.5)
Methemoglobin: 1.7 % — ABNORMAL HIGH (ref 0.0–1.5)
O2 Saturation: 59.6 %
O2 Saturation: 58.7 %
Total hemoglobin: 8.8 g/dL — ABNORMAL LOW (ref 12.0–16.0)
Total hemoglobin: 9.3 g/dL — ABNORMAL LOW (ref 12.0–16.0)

## 2017-06-14 LAB — PROTIME-INR
INR: 1.44
Prothrombin Time: 17.4 seconds — ABNORMAL HIGH (ref 11.4–15.2)

## 2017-06-14 LAB — MAGNESIUM: Magnesium: 2.4 mg/dL (ref 1.7–2.4)

## 2017-06-14 LAB — LACTATE DEHYDROGENASE: LDH: 379 U/L — ABNORMAL HIGH (ref 98–192)

## 2017-06-14 MED ORDER — PROMETHAZINE HCL 25 MG/ML IJ SOLN
12.5000 mg | INTRAMUSCULAR | Status: DC | PRN
  Administered 2017-06-14 – 2017-06-18 (×6): 12.5 mg via INTRAVENOUS
  Filled 2017-06-14 (×6): qty 1

## 2017-06-14 MED ORDER — SODIUM BICARBONATE 8.4 % IV SOLN
50.0000 meq | Freq: Once | INTRAVENOUS | Status: AC
  Administered 2017-06-14: 50 meq via INTRAVENOUS

## 2017-06-14 MED ORDER — AMIODARONE IV BOLUS ONLY 150 MG/100ML
150.0000 mg | Freq: Once | INTRAVENOUS | Status: AC
Start: 2017-06-14 — End: 2017-06-14
  Administered 2017-06-14: 150 mg via INTRAVENOUS

## 2017-06-14 MED ORDER — VANCOMYCIN HCL IN DEXTROSE 1-5 GM/200ML-% IV SOLN
1000.0000 mg | Freq: Two times a day (BID) | INTRAVENOUS | Status: DC
  Filled 2017-06-14: qty 200

## 2017-06-14 MED ORDER — INSULIN DETEMIR 100 UNIT/ML ~~LOC~~ SOLN
15.0000 [IU] | Freq: Two times a day (BID) | SUBCUTANEOUS | Status: DC
  Administered 2017-06-14 (×2): 15 [IU] via SUBCUTANEOUS
  Filled 2017-06-14 (×3): qty 0.15

## 2017-06-14 MED ORDER — VANCOMYCIN HCL 10 G IV SOLR
1250.0000 mg | INTRAVENOUS | Status: DC
  Administered 2017-06-14: 1250 mg via INTRAVENOUS
  Filled 2017-06-14: qty 1250

## 2017-06-14 MED ORDER — PIPERACILLIN-TAZOBACTAM 3.375 G IVPB
3.3750 g | Freq: Three times a day (TID) | INTRAVENOUS | Status: DC
  Administered 2017-06-14 – 2017-06-15 (×4): 3.375 g via INTRAVENOUS
  Filled 2017-06-14 (×5): qty 50

## 2017-06-14 MED ORDER — FUROSEMIDE 10 MG/ML IJ SOLN
80.0000 mg | Freq: Once | INTRAMUSCULAR | Status: AC
  Administered 2017-06-14: 80 mg via INTRAVENOUS

## 2017-06-14 NOTE — Progress Notes (Signed)
HeartMate 3 Rounding Note   postop day #2  Subjective:    HeartMate 3 implantation October 8 with preoperative ischemic cardiomyopathy, advanced heart failure on IV inotropes, indication was destination therapy  Patient stable postop day 1. Extubated  A.m. Postop day 1 Stable LVAD parameters with flow 4.2 L/m.     postop low urine output and rising creatinine consistent with acute renal failure, HTN. Continue Lasix drip. Excellent perfusion of his lower extremities, cardiac index greater than 2.5 VAD parameters remain satisfactory. With acute renal failure and fluid retention his A-line pulsatility has increased, will increase pump speed to 5500 RPM  Starting IV heparin low-dose with history of DVT and factor V Leiden deficiency. PTT 58. INR 1.45on Coumadin 2.5 mg daily    LVAD INTERROGATION:  HeartMate IIILVAD:  Flow 4.2 liters/min, speed 5400, power 4.0, PI 3.0.  Controller Serial intact.   Objective:    Vital Signs:   Temp:  [97.3 F (36.3 C)-99.9 F (37.7 C)] 97.6 F (36.4 C) (10/10 1100) Pulse Rate:  [96-101] 101 (10/10 1419) Resp:  [16-36] 22 (10/10 1419) BP: (85-134)/(60-116) 112/88 (10/10 1300) SpO2:  [87 %-100 %] 93 % (10/10 1419) Weight:  [220 lb 3.8 oz (99.9 kg)] 220 lb 3.8 oz (99.9 kg) (10/10 0445) Last BM Date: 06/11/17 Mean arterial Pressure 85 mmHg  Intake/Output:   Intake/Output Summary (Last 24 hours) at 06/14/17 1424 Last data filed at 06/14/17 1300  Gross per 24 hour  Intake          3249.93 ml  Output              945 ml  Net          2304.93 ml     Physical Exam: General:  Well appearing. No resp difficulty HEENT: normal Neck: supple. JVP .; no bruits. No lymphadenopathy or thryomegaly appreciated. Cor: Mechanical heart sounds with LVAD hum present. Lungs: clear Abdomen: soft, nontender, nondistended. No hepatosplenomegaly. No bruits or masses. Good bowel sounds. Extremities: no cyanosis, clubbing, rash, mild edema of the extremities edema Neuro:  alert & orientedx3, cranial nerves grossly intact. moves all 4 extremities w/o difficulty. Affect pleasant  Telemetry: A paced at 88/m  Labs: Basic Metabolic Panel:  Recent Labs Lab 06/11/17 0636 06/12/17 0210  06/12/17 1830 06/12/17 2259 06/12/17 2306 06/13/17 0320 06/13/17 1548 06/13/17 1700 06/14/17 0310  NA 136 133*  < > 134*  --  137 134* 133*  --  130*  K 4.1 3.6  < > 3.9  --  3.8 3.9 5.2*  --  4.9  CL 100* 99*  < > 104  --  100* 103 99*  --  97*  CO2 28 27  --  22  --   --  22  --   --  21*  GLUCOSE 136* 118*  < > 141*  --  99 128* 175*  --  220*  BUN 27* 25*  < > 18  --  20 19 21*  --  25*  CREATININE 1.68* 1.54*  < > 1.34* 1.52* 1.40* 1.62* 2.00* 2.08* 2.75*  CALCIUM 8.6* 8.1*  --  8.0*  --   --  8.3*  --   --  8.4*  MG  --   --   --  2.9* 2.7*  --  2.5*  --  2.4 2.4  PHOS  --   --   --   --   --   --  3.1  --   --  5.9*  < > = values in this interval not displayed.  Liver Function Tests:  Recent Labs Lab 06/10/17 0609 06/11/17 0636 06/12/17 0210 06/13/17 0320 06/14/17 0310  AST 54* 41 31 89* 78*  ALT 59 48 36 24 24  ALKPHOS 101 117 98 71 83  BILITOT 0.7 0.7 0.5 2.2* 2.5*  PROT 7.0 7.2 6.0* 5.7* 6.3*  ALBUMIN 3.0* 3.1* 2.6* 3.4* 3.2*   No results for input(s): LIPASE, AMYLASE in the last 168 hours. No results for input(s): AMMONIA in the last 168 hours.  CBC:  Recent Labs Lab 06/10/17 0609 06/11/17 0636 06/12/17 0210  06/12/17 1830 06/12/17 2259 06/12/17 2306 06/13/17 0320 06/13/17 1548 06/13/17 1700 06/14/17 0310  WBC 10.1 11.2* 9.9  < > 17.0* 14.7*  --  15.8*  --  24.4* 25.7*  NEUTROABS 7.7 8.5* 6.6  --   --   --   --  13.4*  --   --  22.6*  HGB 9.7* 9.9* 8.5*  < > 9.1* 9.0* 10.2* 9.6* 10.2* 9.6* 8.8*  HCT 30.0* 31.8* 27.5*  < > 28.0* 27.5* 30.0* 28.9* 30.0* 28.8* 27.0*  MCV 82.9 82.2 80.9  < > 79.8 78.8  --  78.3  --  78.9 79.2  PLT 192 213 188  < > 124* 111*  --  123*  --  133* 125*  < > = values in this interval not  displayed.  INR:  Recent Labs Lab 06/12/17 1234 06/12/17 1251 06/12/17 2137 06/13/17 0320 06/14/17 0310  INR 1.36 1.44 1.28 1.23 1.44    Other results:  EKG:   Imaging: Dg Chest Port 1 View  Result Date: 06/14/2017 CLINICAL DATA:  Insertion of implantable left ventricular assist device 2 days ago. EXAM: PORTABLE CHEST 1 VIEW COMPARISON:  Portable chest x-ray of June 13, 2017 FINDINGS: The trachea and esophagus have been extubated. The lungs are mildly hypoinflated. There is density at the left lung base little changed from the previous study. There is no large pleural effusion and no pneumothorax. The cardiac silhouette is enlarged. The Swan-Ganz catheter tip projects over the proximal right main pulmonary artery. The ICD is in stable position. The left ventricular assist device also appears stable. The mediastinal drain and the left chest tube are in stable position. The right internal jugular venous catheter tip projects over the cavoatrial junction. The sternal wires are intact. IMPRESSION: Fairly stable appearance of the chest since extubation. Persistent left lower lobe atelectasis or pneumonia. Small bilateral pleural effusions. No pneumothorax. Mild cardiomegaly. Decreased engorgement of the pulmonary vascularity. The support tubes and devices are in reasonable position. Electronically Signed   By: David  Martinique M.D.   On: 06/14/2017 08:11   Dg Chest Port 1 View  Result Date: 06/13/2017 CLINICAL DATA:  ET tube, chest tube, LVAD EXAM: PORTABLE CHEST 1 VIEW COMPARISON:  06/12/2017 FINDINGS: Support devices are stable including LVAD and left chest tube. No pneumothorax. Cardiomegaly with vascular congestion. Low lung volumes. Bilateral perihilar and lower lobe airspace opacities, left greater than right. This could reflect edema and/or atelectasis. IMPRESSION: Bilateral airspace opacities, left greater than right could reflect edema/ CHF and/or atelectasis. No pneumothorax.  Electronically Signed   By: Rolm Baptise M.D.   On: 06/13/2017 07:11     Medications:     Scheduled Medications: . acetaminophen  1,000 mg Oral Q6H   Or  . acetaminophen (TYLENOL) oral liquid 160 mg/5 mL  1,000 mg Per Tube Q6H  . aspirin EC  325 mg  Oral Daily   Or  . aspirin  324 mg Per Tube Daily   Or  . aspirin  300 mg Rectal Daily  . bisacodyl  10 mg Oral Daily   Or  . bisacodyl  10 mg Rectal Daily  . chlorhexidine  15 mL Mouth Rinse BID  . Chlorhexidine Gluconate Cloth  6 each Topical Daily  . docusate sodium  200 mg Oral Daily  . insulin aspart  0-24 Units Subcutaneous Q4H  . insulin aspart  3 Units Subcutaneous TID WC  . insulin detemir  15 Units Subcutaneous BID  . mouth rinse  15 mL Mouth Rinse q12n4p  . metoCLOPramide (REGLAN) injection  10 mg Intravenous Q6H  . mupirocin ointment   Nasal BID  . pantoprazole  40 mg Oral Daily  . sodium chloride flush  10-40 mL Intracatheter Q12H  . sodium chloride flush  3 mL Intravenous Q12H  . warfarin  2.5 mg Oral q1800  . Warfarin - Physician Dosing Inpatient   Does not apply q1800    Infusions: . sodium chloride 10 mL/hr at 06/14/17 0800  . sodium chloride Stopped (06/13/17 1100)  . sodium chloride Stopped (06/13/17 1300)  . albumin human    . amiodarone 30 mg/hr (06/14/17 0800)  . DOPamine 3 mcg/kg/min (06/14/17 0800)  . EPINEPHrine 4 mg in dextrose 5% 250 mL infusion (16 mcg/mL) 3 mcg/min (06/14/17 0800)  . furosemide (LASIX) infusion 15 mg/hr (06/14/17 0800)  . heparin 700 Units/hr (06/14/17 0800)  . lactated ringers 10 mL/hr at 06/14/17 0800  . lactated ringers 10 mL/hr at 06/14/17 0800  . milrinone 0.375 mcg/kg/min (06/14/17 1100)  . nitroGLYCERIN Stopped (06/13/17 1700)  . norepinephrine (LEVOPHED) Adult infusion Stopped (06/14/17 0630)  . piperacillin-tazobactam (ZOSYN)  IV Stopped (06/14/17 1315)  . vancomycin      PRN Medications: sodium chloride, albumin human, midazolam, morphine injection, ondansetron  (ZOFRAN) IV, oxyCODONE, promethazine, sodium chloride flush, sodium chloride flush, traMADol   Assessment:  HeartMate 3 VAD implant for ischemic cardiomyopathy Preoperative ventricular ectopy with permanent pacemaker-AICD Preoperative moderate chronic kidney disease stage III Preoperative hemicolectomy for granulomatous colitis Poorly controlled diabetes mellitus with hemoglobin A1c 10.0, neuropathy Peripheral vascular disease with ABIs 0.5 bilaterally  postoperative acute renal failure with rising creatinine 2.75 and oliguria  Plan/Discussion:    Continue current low-dose inotropes including low-dose norepinephrine, dopamine, epinephrine. Continue 0.375 milrinone for RV function  Lasix drip  increased15 mg/m, evidence of acute on chronic renal insufficiency with low urine output and fluid retention and poor response to Lasix  Start low-dose IV heparin for previous history of DVT and factor V Leiden deficiency. Oral Coumadin dosing started. INR 1.45  Remove  PA catheter today  and mobilize out of bed to chair   if oxygenation becomes a problem patient may need CRT. Minimize IV fluids  I reviewed the LVAD parameters from today, and compared the results to the patient's prior recorded data.  No programming changes were made.  The LVAD is functioning within specified parameters.  The patient performs LVAD self-test daily.  LVAD interrogation was negative for any significant power changes, alarms or PI events/speed drops.  LVAD equipment check completed and is in good working order.  Back-up equipment present.   LVAD education done on emergency procedures and precautions and reviewed exit site care.  Length of Stay: [place] [facility] 06/14/2017, 2:24 PM

## 2017-06-14 NOTE — Progress Notes (Signed)
Nutrition Follow-up  DOCUMENTATION CODES:   Not applicable  INTERVENTION:  - Continue to monitor for diet advancement provide medically appropriate nutritional supplements  NUTRITION DIAGNOSIS:   Increased nutrient needs related to  (planned LVAD placement) as evidenced by estimated needs.  Ongoing   GOAL:   Patient will meet greater than or equal to 90% of their needs  Not met d/t NPO status  MONITOR:   Supplement acceptance, Diet advancement, PO intake, Labs  ASSESSMENT:   Pt with PMH of CAD s/p MI, CHF, CKD, chronic N/V/D from gastroparesis, DM, admitted 10/3 for optimization prior to LVAD. Multiple teeth extractions 10/4.   Pt s/p HM3LVAD placement on 06/12/17.  Pt intubated 06/12/17 Pt extubated 06/13/17 to nasal cannula  Pt is up 29 lbs and net positive 7 L since admission. Pt with 3 chest tubes output yesterday 360 mL.  Pt reports no current nausea at time of visit however reports vomiting early this morning. Pt reports most recent BM on 06/11/17. Pt reports no current appetite at this time. Given pt's lack of appetite and ongoing NPO order, once diet advanced, pt would likely benefit from nutritional supplement. Per recent RD note, pt amenable to Glucerna shakes.   Labs reviewed; CBG 119-219, Na 130, Phosphorus 5.8 Medications reviewed; Dulcolax, Colace, sliding scale insulin, Levemir, Reglan, Protonix, Coumadin  Diet Order:  Diet NPO time specified Except for: Sips with Meds  Skin:  Wound (see comment) (incision at chest)  Last BM:  06/11/17  Height:   Ht Readings from Last 1 Encounters:  06/13/17 _0  (1.727 m)    Weight:   Wt Readings from Last 1 Encounters:  06/14/17 220 lb 3.8 oz (99.9 kg)    Ideal Body Weight:  70 kg  BMI:  Body mass index is 33.49 kg/m.  Estimated Nutritional Needs:   Kcal:  2100-2300  Protein:  110-125 grams  Fluid:  > 1.5 L/day  EDUCATION NEEDS:   Education needs addressed  Parks Ranger, MS, RDN,  LDN 06/14/2017 12:40 PM

## 2017-06-14 NOTE — Plan of Care (Signed)
Problem: Activity: Goal: Risk for activity intolerance will decrease Outcome: Progressing Patient dangled and stood at bedside without difficulty.  Problem: Fluid Volume: Goal: Ability to maintain a balanced intake and output will improve Outcome: Not Progressing Patient making 10-15 mL of urine every hour despite 10 mg of Lasix/hr

## 2017-06-14 NOTE — Progress Notes (Signed)
LVAD Coordinator Rounding Note:  Admitted 06/07/2017 for hemodynamic optimization prior to LVAD placement. Full dental extractions prior to LVAD implant.  HM3LVAD implated on 10/8/2018by Dr Craige Cotta Destination Therapycriteria.  Vital signs: HR: 99 Doppler Pressure: 88 Automatic cuff: 125/89 (100) O2 Sat: 93% on 5LNC NO: 4 PPM Wt:194>217>220 lbs   LVAD interrogation reveals:   Speed:5400 Flow: 4.3 Power: 3.9 PI: 3.3 Alarms: none Events: none Hematocrit: 27 Fixed speed: 5400 Low speed limit: 5100  10/8>  61 PI events 10/8> 1 low flow alarm 10/9> 4 PI events  Drive Line: Daily. Change per protocol using daily kits and silver strip.   Labs:  LDH trend:255>343>379  INR trend: 1.44>1.23>1.44  Anticoagulation Plan: -INR Goal: 2-3, warfarin restarted 10/9 -ASA Dose: 355m (will remain on this with factor V heterozygosity)  Blood Products:  - 2 units FFP in OR - 1 unit Platelets in OR  Gtts: - Amiodarone at 30 mg/hr - Dopamine at 3 mcg/kg/min - Epinephrine 3 mcg/min - Insulin off - Milrinone 0.375 mcg/kg/min - Lasix @ 15 mg/hr -Heparin per pharmacy   Device: -St Jude  -Therapies: off. Perm pacer set at 60 bpm. - Temp pacer set  AAI at 88 bpm, MA-5  Arrythmias: frequent ventricular ectopy  Respiratory: extubated 10/9 to 4 LNC  Renal:  -BUN/CRT: 21/1.10, 19/1.62, 25/2.75   Adverse Events on VAD: -none  Plan/Recommendations:   1. Please change drive-line dressing daily using daily dressing kits and silver strips. Please have Katrina, daughter, observe dressing change today. 2. Please page VAD pager with any equipment concerns.  LBalinda QuailsRN, VAD Coordinator 24/7 pager [phone]

## 2017-06-14 NOTE — Evaluation (Signed)
Occupational Therapy Evaluation Patient Details Name: [name] MRN: [id number] DOB: Jan 25, 1966 Today's Date: 06/14/2017    History of Present Illness 51 yo with history of CAD s/p anterior MI in 2010 and chronic systolic CHF (ischemic cardiomyopathy) as well as CKD stage III now s/p HeartMate 3 VAD placement.  PMH includes:  ASCVD,  CHF, Chron's disease, DVT, gastroparesis; HTN, ischemic cardiomyopathy DM; s/p cardiac debriblator placement.    Clinical Impression   Pt admitted with above. He demonstrates the below listed deficits and will benefit from continued OT to maximize safety and independence with BADLs.  Pt required mod A +2 for bed > chair transfer.  Pt on 5LO2 with Nitric oxide with sats >90% with treatment.  HR 99-110 bpm, CVP 14 initially and down to 8-9 after transfer.  BP 114/75.  Flow 4.2, Speed 5500, PI 3.7 down to 3.3 with activity and power 4.1.  He requires max - total A, overall for ADLs with exception of grooming and self feeding.   He lives with adult children who will assist at discharge, and was mod I with ADLs PTA.  He reports he required some assist with IADLs and community activities were limited.  He endorses a h/o falls.   Anticipate good progress.  Will follow acutely.         Follow Up Recommendations  Home health OT;Supervision/Assistance - 24 hour    Equipment Recommendations  3 in 1 bedside commode    Recommendations for Other Services       Precautions / Restrictions Precautions Precautions: Fall;Sternal Precaution Comments: LVAD, chest tube, 3 IV poles, Nitric oxide Restrictions Weight Bearing Restrictions: No      Mobility Bed Mobility Overal bed mobility: Needs Assistance Bed Mobility: Supine to Sit     Supine to sit: +2 for physical assistance;Max assist;HOB elevated (3rd person (nurse) to watch all lines)     General bed mobility comments: Pt needed assist for LEs and for elevation of trunk.    Transfers Overall transfer  level: Needs assistance Equipment used: Rolling walker (2 wheeled) Transfers: Sit to/from [facility] Sit to Stand: Mod assist;+2 physical assistance Stand pivot transfers: Mod assist;+2 physical assistance       General transfer comment: Pt able to stand up holding onto pillow and take pivotal steps around to chair.  +3 to assist with line management.      Balance Overall balance assessment: Needs assistance Sitting-balance support: No upper extremity supported;Feet supported Sitting balance-[name]: Poor Sitting balance - Comments: Needed min guard to min assist to sit EOB.  Pt with flexed head and neck.     Standing balance support: Bilateral upper extremity supported;During functional activity Standing balance-[name]: Poor Standing balance comment: requires +2 assist to stand at EOB with anterior lean.                            ADL either performed or assessed with clinical judgement   ADL Overall ADL's : Needs assistance/impaired Eating/Feeding: Minimal assistance;Sitting   Grooming: Wash/dry hands;Wash/dry face;Oral care;Brushing hair;Minimal assistance;Sitting   Upper Body Bathing: Maximal assistance;Sitting   Lower Body Bathing: Total assistance;Sit to/from stand   Upper Body Dressing : Total assistance;Sitting   Lower Body Dressing: Total assistance;Sit to/from stand   Toilet Transfer: Moderate assistance;+2 for physical assistance;BSC   Toileting- Clothing Manipulation and Hygiene: Total assistance;Sit to/from stand       Functional mobility during ADLs: Moderate assistance;+2 for physical  assistance General ADL Comments: Pt with limited activity tolerance      Vision         Perception     Praxis      Pertinent Vitals/Pain Pain Assessment: 0-10 Pain Score: 5  Pain Location: generalized; chest  Pain Descriptors / Indicators: Grimacing;Guarding Pain Intervention(s): Monitored during session;Limited activity  within patient's tolerance     Hand Dominance Right   Extremity/Trunk Assessment Upper Extremity Assessment Upper Extremity Assessment: Generalized weakness   Lower Extremity Assessment Lower Extremity Assessment: Defer to PT evaluation   Cervical / Trunk Assessment Cervical / Trunk Assessment: Kyphotic (keeps head/neck and trunk flexed )   Communication Communication Communication: No difficulties   Cognition Arousal/Alertness: Awake/alert Behavior During Therapy: Flat affect Overall Cognitive Status: Impaired/Different from baseline Area of Impairment: Problem solving;Safety/judgement;Following commands                       Following Commands: Follows one step commands with increased time Safety/Judgement: Decreased awareness of safety   Problem Solving: Slow processing;Decreased initiation;Difficulty sequencing;Requires verbal cues;Requires tactile cues     General Comments       Exercises     Shoulder Instructions      Home Living Family/patient expects to be discharged to:: Private residence Living Arrangements: Children Available Help at Discharge: Family;Available 24 hours/day Type of Home: House Home Access: Stairs to enter [employer] of Steps: 2   Home Layout: Multi-level;Able to live on main level with bedroom/bathroom Alternate Level Stairs-Number of Steps: flight   Bathroom Shower/Tub: Teacher, early years/pre: Standard     Home Equipment: Kasandra Knudsen - single point   Additional Comments: pt lives with son and daughter, one of which is with him all the time. His bedroom is downstairs but the kids have moved him to the main level so he will only have to do 2 STE      Prior Functioning/Environment Level of Independence: Needs assistance  Gait / Transfers Assistance Needed: used cane for ambulation ADL's / Homemaking Assistance Needed: reports having assistance for LB dressing, stands to shower            OT Problem  List: Decreased strength;Decreased activity tolerance;Impaired balance (sitting and/or standing);Decreased safety awareness;Decreased cognition;Decreased knowledge of use of DME or AE;Decreased knowledge of precautions;Cardiopulmonary status limiting activity;Pain      OT Treatment/Interventions: Self-care/ADL training;Therapeutic exercise;DME and/or AE instruction;Therapeutic activities;Patient/family education;Balance training;Cognitive remediation/compensation    OT Goals(Current goals can be found in the care plan section) Acute Rehab OT Goals Patient Stated Goal: to go home OT Goal Formulation: With patient Time For Goal Achievement: 06/28/17 Potential to Achieve Goals: Good ADL Goals Pt Will Perform Grooming: with min guard assist;standing Pt Will Perform Upper Body Bathing: with set-up;with supervision;sitting Pt Will Perform Lower Body Bathing: with min guard assist;sit to/from stand Pt Will Perform Upper Body Dressing: with supervision;with set-up;sitting Pt Will Perform Lower Body Dressing: with min guard assist;sit to/from stand Pt Will Transfer to Toilet: with min guard assist;ambulating;regular height toilet;bedside commode Pt Will Perform Toileting - Clothing Manipulation and hygiene: with min guard assist;sit to/from stand Additional ADL Goal #1: Pt will be independent with sternal precautions during ADLs  Additional ADL Goal #2: Pt will independently manage LVAD equipment during ADLs/functional activities Additional ADL Goal #3: Pt will be able to tolerate 25 mins of therapeutic activity with no more than 3 rest breaks in prep for ADLs   OT Frequency: Min 2X/week   Barriers to  D/C:            Co-evaluation PT/OT/SLP Co-Evaluation/Treatment: Yes Reason for Co-Treatment: Complexity of the patient's impairments (multi-system involvement);For patient/therapist safety   OT goals addressed during session: Strengthening/ROM      AM-PAC PT "6 Clicks" Daily Activity      Outcome Measure Help from another person eating meals?: A Little Help from another person taking care of personal grooming?: A Little Help from another person toileting, which includes using toliet, bedpan, or urinal?: A Lot Help from another person bathing (including washing, rinsing, drying)?: A Lot Help from another person to put on and taking off regular upper body clothing?: A Lot Help from another person to put on and taking off regular lower body clothing?: Total 6 Click Score: 13   End of Session Equipment Utilized During Treatment: Oxygen Nurse Communication: Mobility status  Activity Tolerance: Patient limited by fatigue;Other (comment) (nausea/vomiting and swan ganz ) Patient left: in chair;with call bell/phone within reach;with chair alarm set;with nursing/sitter in room  OT Visit Diagnosis: Unsteadiness on feet (R26.81);Pain Pain - part of body:  (chest )                Time: 0110-0349 OT Time Calculation (min): 43 min Charges:  OT General Charges $OT Visit: 1 Visit OT Evaluation $OT Eval High Complexity: 1 High G-Codes:     [facility], OTR/L [phone]   Ladonna Snide, Shelva Hetzer M 06/14/2017, 6:18 PM

## 2017-06-14 NOTE — Progress Notes (Signed)
CSW met with patient who was sitting up in chair at bedside. Patient reports he feels "ok" and noted he has been in the chair since 10:30am this morning. Patient was in good spirits and appeared motivated for continued recovery. CSW also met with patient's mother in unit hallway. Mother was very positive about patient's recovery and made reference to his faith as a major factor in his recovery. Mom plans to return home tomorrow but will return when possible. CSW provided supportive intervention and will continue to follow throughout implant hospitalization. Raquel Sarna, Bud, [place]

## 2017-06-14 NOTE — Progress Notes (Addendum)
Pharmacy Antibiotic Note  [name] is a 51 y.o. male admitted on 06/07/2017 with surgical prophylaxis.  Pharmacy has been consulted for vancomycin dosing x 48 hrs post-op. Renal function worsening acutely with SCr up to 2.7, WBC up - per MD add Zosyn and continue vancomycin for now.  Plan: -Adjust Vancomycin to 1233m IV q24 hrs -Add Zosyn 3.375g IV EI q8h   Height: 5' 8"  (172.7 cm) Weight: 220 lb 3.8 oz (99.9 kg) (with controller) IBW/kg (Calculated) : 68.4  Temp (24hrs), Avg:98.5 F (36.9 C), Min:97.3 F (36.3 C), Max:99.9 F (37.7 C)   Recent Labs Lab 06/12/17 1830 06/12/17 2259 06/12/17 2306 06/13/17 0320 06/13/17 1548 06/13/17 1700 06/14/17 0310  WBC 17.0* 14.7*  --  15.8*  --  24.4* 25.7*  CREATININE 1.34* 1.52* 1.40* 1.62* 2.00* 2.08* 2.75*    Estimated Creatinine Clearance: 36 mL/min (A) (by C-G formula based on SCr of 2.75 mg/dL (H)).    Allergies  Allergen Reactions  . Metformin And Related Diarrhea    Antimicrobials this admission: Vancomycin 10/8 >>  Cefuroxime 10/8 >>10/10 Zosyn 10/10 >>  Dose adjustments this admission: 10/10: SCr up> vancomycin to q12h  Microbiology results: 10/5 MRSA PCR: neg  Thank you for allowing pharmacy to be a part of this patient's care.  MArrie Senate PharmD PGY-2 Cardiology Pharmacy Resident Pager: [phone]/06/2017

## 2017-06-14 NOTE — Progress Notes (Signed)
Patient ID: [name], male   DOB: August 25, 1966, 51 y.o.   MRN: [id number]   Advanced Heart Failure VAD Team Note  Subjective:    Admitted 10/3 for RHC/Swan implantation and optimization prior to LVAD.  He was started on milrinone 0.125 and had multiple teeth removed.  Creatinine improved with milrinone.   HeartMate 3 VAD implanted 10/8.  No early surgical complications.   Frequent ventricular ectopy noted post op, amiodarone begun. Has improved.   Remains on epi 3, Dopamine 3, and milrinone 0.375. [name] Remains on epinephrine at 3, dopamine at 3, and milrinone 0.375  mcg/kg/min with CI 2.27.  Dopamine 3 mcg/min started 06/12/17 evening with improved urine output and CI.   Tired and sore this am. Denies SOB. Rates chest pain 5/10.   Hgb 8.8. WBC up to 25.7. Tmax 99.9. Creatinine 1.40 -> 1.62 -> 2.7. Has been on Vanc and cefuroxime.   Up 26 lbs from pre-op. Poor urine output noted yesterday ( 525 cc total), very little overnight. Given metolazone yesterday.   LDH 255 -> 343 -> 379  CXR 06/13/17 am with L>R edema/CHF.  Swan numbers 0500 CVP 18-19 (my personal check) PA 58/22 (32) CO 6.3 CI 3.1  LVAD INTERROGATION:  HeartMate 3:  Flow 4.4 liters/min, speed 5400 rpm, power 4.0, PI 3.1. No PI events since 10/9 at 0730.  Objective:    Vital Signs:   Temp:  [97.3 F (36.3 C)-99.9 F (37.7 C)] 97.7 F (36.5 C) (10/10 0700) Pulse Rate:  [87-118] 101 (10/10 0430) Resp:  [15-36] 16 (10/10 0700) BP: (85-134)/(56-116) 102/84 (10/10 0700) SpO2:  [87 %-100 %] 93 % (10/10 0737) FiO2 (%):  [40 %] 40 % (10/09 0805) Weight:  [220 lb 3.8 oz (99.9 kg)] 220 lb 3.8 oz (99.9 kg) (10/10 0445) Last BM Date: 06/11/17 Mean arterial Pressure 90  Intake/Output:   Intake/Output Summary (Last 24 hours) at 06/14/17 0741 Last data filed at 06/14/17 0700  Gross per 24 hour  Intake          3495.72 ml  Output              885 ml  Net          2610.72 ml     Physical Exam    GENERAL:  Fatigued and ill appearing. NAD.  HEENT: Normal. NECK: Supple, JVP 11-12 cm. Carotids OK.  CARDIAC:  Mechanical heart sounds with LVAD hum present.  LUNGS:  Diminished basilar sounds.  ABDOMEN:  NT, ND, no HSM. No bruits or masses. +BS  LVAD exit site: Dressing dry and intact. No erythema or drainage. Stabilization device present and accurately applied. Driveline dressing changed daily per sterile technique. EXTREMITIES:  Warm and dry. No cyanosis, clubbing, rash, or edema.  NEUROLOGIC:  Alert & oriented x 3. Cranial nerves grossly intact. Moves all 4 extremities w/o difficulty. Affect pleasant     Telemetry   A-paced at 90 bpm, Personally reviewed.   Labs   Basic Metabolic Panel:  Recent Labs Lab 06/11/17 0636 06/12/17 0210  06/12/17 1830 06/12/17 2259 06/12/17 2306 06/13/17 0320 06/13/17 1548 06/13/17 1700 06/14/17 0310  NA 136 133*  < > 134*  --  137 134* 133*  --  130*  K 4.1 3.6  < > 3.9  --  3.8 3.9 5.2*  --  4.9  CL 100* 99*  < > 104  --  100* 103 99*  --  97*  CO2 28 27  --  22  --   --  22  --   --  21*  GLUCOSE 136* 118*  < > 141*  --  99 128* 175*  --  220*  BUN 27* 25*  < > 18  --  20 19 21*  --  25*  CREATININE 1.68* 1.54*  < > 1.34* 1.52* 1.40* 1.62* 2.00* 2.08* 2.75*  CALCIUM 8.6* 8.1*  --  8.0*  --   --  8.3*  --   --  8.4*  MG  --   --   --  2.9* 2.7*  --  2.5*  --  2.4 2.4  PHOS  --   --   --   --   --   --  3.1  --   --  5.9*  < > = values in this interval not displayed.  Liver Function Tests:  Recent Labs Lab 06/10/17 0609 06/11/17 0636 06/12/17 0210 06/13/17 0320 06/14/17 0310  AST 54* 41 31 89* 78*  ALT 59 48 36 24 24  ALKPHOS 101 117 98 71 83  BILITOT 0.7 0.7 0.5 2.2* 2.5*  PROT 7.0 7.2 6.0* 5.7* 6.3*  ALBUMIN 3.0* 3.1* 2.6* 3.4* 3.2*   No results for input(s): LIPASE, AMYLASE in the last 168 hours. No results for input(s): AMMONIA in the last 168 hours.  CBC:  Recent Labs Lab 06/10/17 0609 06/11/17 0636 06/12/17 0210   06/12/17 1830 06/12/17 2259 06/12/17 2306 06/13/17 0320 06/13/17 1548 06/13/17 1700 06/14/17 0310  WBC 10.1 11.2* 9.9  < > 17.0* 14.7*  --  15.8*  --  24.4* 25.7*  NEUTROABS 7.7 8.5* 6.6  --   --   --   --  13.4*  --   --  22.6*  HGB 9.7* 9.9* 8.5*  < > 9.1* 9.0* 10.2* 9.6* 10.2* 9.6* 8.8*  HCT 30.0* 31.8* 27.5*  < > 28.0* 27.5* 30.0* 28.9* 30.0* 28.8* 27.0*  MCV 82.9 82.2 80.9  < > 79.8 78.8  --  78.3  --  78.9 79.2  PLT 192 213 188  < > 124* 111*  --  123*  --  133* 125*  < > = values in this interval not displayed.  INR:  Recent Labs Lab 06/12/17 1234 06/12/17 1251 06/12/17 2137 06/13/17 0320 06/14/17 0310  INR 1.36 1.44 1.28 1.23 1.44    Other results:  EKG:    Imaging   Dg Chest Port 1 View  Result Date: 06/13/2017 CLINICAL DATA:  ET tube, chest tube, LVAD EXAM: PORTABLE CHEST 1 VIEW COMPARISON:  06/12/2017 FINDINGS: Support devices are stable including LVAD and left chest tube. No pneumothorax. Cardiomegaly with vascular congestion. Low lung volumes. Bilateral perihilar and lower lobe airspace opacities, left greater than right. This could reflect edema and/or atelectasis. IMPRESSION: Bilateral airspace opacities, left greater than right could reflect edema/ CHF and/or atelectasis. No pneumothorax. Electronically Signed   By: Rolm Baptise M.D.   On: 06/13/2017 07:11   Dg Chest Port 1 View  Result Date: 06/12/2017 CLINICAL DATA:  Left ventricular assist device insertion. Acute on chronic heart failure. EXAM: PORTABLE CHEST 1 VIEW COMPARISON:  06/09/2017 FINDINGS: Endotracheal tube and NG tube and chest tubes appear in good position. AICD in place. Swan-Ganz catheter in the right main pulmonary artery. Left ventricular assist device in place. Heart size is normal. Slight pulmonary vascular congestion on the left. No pneumothorax. IMPRESSION: 1. Tubes in position as described. 2. No pneumothorax. 3. Slight pulmonary vascular congestion on the left. Electronically Signed    By:  Lorriane Shire M.D.   On: 06/12/2017 13:11     Medications:     Scheduled Medications: . acetaminophen  1,000 mg Oral Q6H   Or  . acetaminophen (TYLENOL) oral liquid 160 mg/5 mL  1,000 mg Per Tube Q6H  . aspirin EC  325 mg Oral Daily   Or  . aspirin  324 mg Per Tube Daily   Or  . aspirin  300 mg Rectal Daily  . bisacodyl  10 mg Oral Daily   Or  . bisacodyl  10 mg Rectal Daily  . chlorhexidine  15 mL Mouth Rinse BID  . Chlorhexidine Gluconate Cloth  6 each Topical Daily  . digoxin  0.125 mg Oral Daily  . docusate sodium  200 mg Oral Daily  . insulin aspart  0-24 Units Subcutaneous Q4H  . insulin aspart  3 Units Subcutaneous TID WC  . insulin detemir  15 Units Subcutaneous BID  . mouth rinse  15 mL Mouth Rinse q12n4p  . metoCLOPramide (REGLAN) injection  10 mg Intravenous Q6H  . mupirocin ointment   Nasal BID  . pantoprazole  40 mg Oral Daily  . sodium chloride flush  10-40 mL Intracatheter Q12H  . sodium chloride flush  3 mL Intravenous Q12H  . warfarin  2.5 mg Oral q1800  . Warfarin - Physician Dosing Inpatient   Does not apply q1800    Infusions: . sodium chloride 10 mL/hr at 06/13/17 2300  . sodium chloride Stopped (06/13/17 1100)  . sodium chloride Stopped (06/13/17 1300)  . albumin human    . amiodarone 30 mg/hr (06/14/17 0700)  . cefUROXime (ZINACEF)  IV Stopped (06/14/17 0157)  . DOPamine 3 mcg/kg/min (06/14/17 0700)  . EPINEPHrine 4 mg in dextrose 5% 250 mL infusion (16 mcg/mL) 3 mcg/min (06/14/17 0700)  . furosemide (LASIX) infusion 10 mg/hr (06/14/17 0700)  . heparin 700 Units/hr (06/14/17 0700)  . lactated ringers 10 mL/hr at 06/13/17 2300  . lactated ringers 10 mL/hr at 06/13/17 2300  . milrinone 0.375 mcg/kg/min (06/14/17 0700)  . nitroGLYCERIN Stopped (06/13/17 1700)  . norepinephrine (LEVOPHED) Adult infusion Stopped (06/14/17 0630)  . vancomycin      PRN Medications: sodium chloride, albumin human, midazolam, morphine injection, ondansetron  (ZOFRAN) IV, oxyCODONE, promethazine, sodium chloride flush, sodium chloride flush, traMADol   Patient Profile   51 yo with history of CAD s/p anterior MI in 2010 and chronic systolic CHF (ischemic cardiomyopathy) as well as CKD stage III now s/p HeartMate 3 VAD placement.   Assessment/Plan:    1. Acute on chronic systolic CHF: Ischemic cardiomyopathy. St Jude ICD. NSTEMI in March 2018 with DES to LAD and RCA, complicated by cardiogenic shock, low output requiring milrinone. Most recent echo 8/18 with EF 20-25%, moderate MR. NYHA class IIIb-IVsymptoms (fluctuate).He was admitted and started on milrinone with improved creatinine.  HeartMate 3 VAD placement on 10/8.  - Remains on milrinone 0.375, dopa 3, and epi 3. Coox 59.6%.  - He remains volume overloaded with pulmonary edema on CXR, but urine output poor.  - Increase lasix gtt to 15 mg/hr for now. As ATN improves suspect UO will improve. - Extubated 06/13/17. - Continue warfarin with INR goal 2-3 (aim slightly higher with h/o Factor V Leiden heterozygosity) and ASA 325.  - On low dose heparin. 2. AKI on CKD stage 3:  - Creatinine 1.65 yesterday up to 2.7 this am.  - ? ATN component with VT post op.  3. CAD: NSTEMI in 3/18. LHC with  99% ulcerated lesion proximal RCA with left to right collaterals, 95% mid LAD stenosis after mid LAD stent. s/p PCI to RCA and LAD on 11/25/16.  - Off plavix for LVAD.  - Continue high intensity statin, good lipids recently. No change. 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 Recent V/Q scan did not show acute or chronic PE.  - He is a heterozygote for Factor V Leiden, plan for for ASA 325 and warfarin INR 2-3 with LVAD. No change.  5. Chronic N/V/D with gastroparesis: Follows with GI. Quiescent.  6. DM: On Jardiance at home, will cover with sliding scale> DM coordinator has seen and increased insulin. No change. 7. S/P Multiple Tooth Extractions 4-15, 19-23, and 27-29. Stable. 8. Rhythm:  Frequent ventricular ectopy.  - Continue amiodarone gtt for now. Will try and stop prior to discharge.  9. Leukocytosis - WBC 15.8 -> 25.7. Likely inflammatory as he has been on post-op ABX with vanc and cefuroxime.   I reviewed the LVAD parameters from today, and compared the results to the patient's prior recorded data.  No programming changes were made.  The LVAD is functioning within specified parameters.  The patient performs LVAD self-test daily.  LVAD interrogation was negative for any significant power changes, alarms or PI events/speed drops.  LVAD equipment check completed and is in good working order.  Back-up equipment present.   LVAD education done on emergency procedures and precautions and reviewed exit site care.  Length of Stay: [address]  Annamaria Helling 06/14/2017, 7:41 AM  VAD Team --- VAD ISSUES ONLY--- Pager [phone] (7am - 7am)  Advanced Heart Failure Team  Pager [phone] (M-F; 7a - 4p)  Please contact [facility] for night-coverage after hours (4p -7a ) and weekends on [email or web address]  Patient seen and examined with the above-signed Advanced Practice Provider and/or Housestaff. I personally reviewed laboratory data, imaging studies and relevant notes. I independently examined the patient and formulated the important aspects of the plan. I have edited the note to reflect any of my changes or salient points. I have personally discussed the plan with the patient and/or family.  He remains tenuous on multiple pressors. Now with ATN. Will increase lasix. Continue epi and milrinone to maximize renal perfusion.  WBC up. Will Continue vanc and change cefuroxime to Zosyn. Rhythm stable  On exam  Fatigue. Chest sore RIJ swan  Cor HM3 sounds Ab distended. Hypoactive BS. Driveline site ok Ext mild edema Neuro awake alert.   Has post-op ATN. Will increase lasix. Continue pressors. Adjust abx as above. VAD parameters look good. Pull swan and ambulate.   CRITICAL  CARE Performed by: [name]  Total critical care time: 35 minutes  Critical care time was exclusive of separately billable procedures and treating other patients.  Critical care was necessary to treat or prevent imminent or life-threatening deterioration.  Critical care was time spent personally by me (independent of midlevel providers or residents) on the following activities: development of treatment plan with patient and/or surrogate as well as nursing, discussions with consultants, evaluation of patient's response to treatment, examination of patient, obtaining history from patient or surrogate, ordering and performing treatments and interventions, ordering and review of laboratory studies, ordering and review of radiographic studies, pulse oximetry and re-evaluation of patient's condition.  [name], MD  8:48 AM

## 2017-06-14 NOTE — Evaluation (Signed)
Physical Therapy Evaluation Patient Details Name: [name] MRN: [id number] DOB: [birth year] Today's Date: 06/14/2017   History of Present Illness  51 yo with history of CAD s/p anterior MI in 2010 and chronic systolic CHF (ischemic cardiomyopathy) as well as CKD stage III now s/p HeartMate 3 VAD placement.   Past Medical History:  Diagnosis Date  . Angina   . ASCVD (arteriosclerotic cardiovascular disease)    , Anterior infarction 2005, LAD diagonal bifurcation intervention 03/2004  . Automatic implantable cardiac defibrillator -St. Jude's       . Benign neoplasm of colon   . CHF (congestive heart failure) ([place])   . Chronic systolic heart failure ([place])   . Coronary artery disease     Widely patent previously placed stents in the left anterior   . Crohn's disease ([place])   . 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 Dyspnea   . Gastroparesis   . GERD (gastroesophageal reflux disease)   . High cholesterol   . Hyperlipidemia   . Hypersomnolent    Previous diagnosis of narcolepsy  . Hypertension, essential   . Ischemic cardiomyopathy    Ejection fraction 15-20% catheterization 2010  . Type II or unspecified type diabetes mellitus without mention of complication, not stated as uncontrolled   . Unspecified gastritis and gastroduodenitis without mention of hemorrhage     Clinical Impression  Pt admitted with above diagnosis. Pt currently with functional limitations due to the deficits listed below (see PT Problem List). Pt was able to stand and pivot to recliner.  Pt tolerated well. Nurse present.  Will follow acutely.  Pt will benefit from skilled PT to increase their independence and safety with mobility to allow discharge to the venue listed below.      Follow Up Recommendations Home health PT;Supervision/Assistance - 24 hour    Equipment Recommendations  Other (comment) (rollator)    Recommendations for Other Services       Precautions / Restrictions  Precautions Precautions: Fall;Sternal Precaution Comments: LVAD, chest tube, 3 IV poles, Nitric oxide, Swan Ganz Restrictions Weight Bearing Restrictions: No      Mobility  Bed Mobility Overal bed mobility: Needs Assistance Bed Mobility: Supine to Sit     Supine to sit: +2 for physical assistance;Max assist;HOB elevated (3rd person (nurse) to watch all lines)     General bed mobility comments: Pt needed assist for LEs and for elevation of trunk.    Transfers Overall transfer level: Needs assistance Equipment used: Rolling walker (2 wheeled) Transfers: Sit to/from [facility] Sit to Stand: Mod assist;+2 physical assistance Stand pivot transfers: Mod assist;+2 physical assistance       General transfer comment: Pt able to stand up holding onto pillow and take pivotal steps around to chair.  +3 to assist with line management.    Ambulation/Gait                Stairs            Wheelchair Mobility    Modified Rankin (Stroke Patients Only)       Balance Overall balance assessment: Needs assistance Sitting-balance support: No upper extremity supported;Feet supported Sitting balance-[name]: Poor Sitting balance - Comments: Needed min guard to min assist to sit EOB.  Pt with flexed head and neck.     Standing balance support: Bilateral upper extremity supported;During functional activity Standing balance-[name]: Poor Standing balance comment: requires +2 assist to stand at EOB with anterior lean.  Pertinent Vitals/Pain Pain Assessment: Faces Faces Pain Scale: Hurts little more Pain Location: generalized Pain Descriptors / Indicators: Grimacing;Guarding Pain Intervention(s): Limited activity within patient's tolerance;Monitored during session;Repositioned   Pt on 5LO2 with Nitric oxide with sats >90% with treatment.  HR 99-110 bpm, CVP 14 initially and down to 8-9 after transfer.  BP  114/75.  Flow 4.2, Speed 5500, PI 3.7 down to 3.3 with activity and power 4.1.   Home Living Family/patient expects to be discharged to:: Private residence Living Arrangements: Children Available Help at Discharge: Family;Available 24 hours/day Type of Home: House Home Access: Stairs to enter   [employer] of Steps: 2 Home Layout: Multi-level;Able to live on main level with bedroom/bathroom Home Equipment: Kasandra Knudsen - single point Additional Comments: pt lives with son and daughter, one of which is with him all the time. His bedroom is downstairs but the kids have moved him to the main level so he will only have to do 2 STE    Prior Function Level of Independence: Needs assistance   Gait / Transfers Assistance Needed: used cane for ambulation  ADL's / Homemaking Assistance Needed: reports having assistance for LB dressing, stands to shower        Hand Dominance   Dominant Hand: Right    Extremity/Trunk Assessment   Upper Extremity Assessment Upper Extremity Assessment: Defer to OT evaluation    Lower Extremity Assessment Lower Extremity Assessment: Generalized weakness    Cervical / Trunk Assessment Cervical / Trunk Assessment: Kyphotic  Communication   Communication: No difficulties  Cognition Arousal/Alertness: Awake/alert Behavior During Therapy: Flat affect Overall Cognitive Status: Impaired/Different from baseline Area of Impairment: Problem solving;Safety/judgement;Following commands                       Following Commands: Follows one step commands with increased time Safety/Judgement: Decreased awareness of safety   Problem Solving: Slow processing;Decreased initiation;Difficulty sequencing;Requires verbal cues;Requires tactile cues        General Comments      Exercises General Exercises - Lower Extremity Long Arc Quad: AROM;5 reps;Both;Seated   Assessment/Plan    PT Assessment Patient needs continued PT services  PT Problem List  Decreased strength;Decreased activity tolerance;Decreased balance;Decreased mobility;Decreased knowledge of use of DME;Decreased safety awareness;Decreased knowledge of precautions       PT Treatment Interventions DME instruction;Gait training;Functional mobility training;Therapeutic activities;Therapeutic exercise;Balance training;Stair training;Patient/family education    PT Goals (Current goals can be found in the Care Plan section)  Acute Rehab PT Goals Patient Stated Goal: to go home PT Goal Formulation: With patient Time For Goal Achievement: 06/28/17 Potential to Achieve Goals: Good    Frequency Min 3X/week   Barriers to discharge        Co-evaluation               AM-PAC PT "6 Clicks" Daily Activity  Outcome Measure Difficulty turning over in bed (including adjusting bedclothes, sheets and blankets)?: Unable Difficulty moving from lying on back to sitting on the side of the bed? : Unable Difficulty sitting down on and standing up from a chair with arms (e.g., wheelchair, bedside commode, etc,.)?: A Lot Help needed moving to and from a bed to chair (including a wheelchair)?: A Lot Help needed walking in hospital room?: Total Help needed climbing 3-5 steps with a railing? : Total 6 Click Score: 8    End of Session Equipment Utilized During Treatment: Gait belt;Oxygen Activity Tolerance: Patient limited by fatigue Patient left: in chair;with call  bell/phone within reach;with chair alarm set Nurse Communication: Mobility status PT Visit Diagnosis: Unsteadiness on feet (R26.81);Muscle weakness (generalized) (M62.81)    Time: 1610-9604 PT Time Calculation (min) (ACUTE ONLY): 32 min   Charges:   PT Evaluation $PT Eval Moderate Complexity: 1 Mod PT Treatments $Therapeutic Activity: 8-22 mins   PT G Codes:        Kasmira Cacioppo,PT Acute Rehabilitation [phone] (pager)   Denice Paradise 06/14/2017, 12:36 PM

## 2017-06-15 ENCOUNTER — Inpatient Hospital Stay (HOSPITAL_COMMUNITY): Payer: BLUE CROSS/BLUE SHIELD

## 2017-06-15 DIAGNOSIS — N178 Other acute kidney failure: Secondary | ICD-10-CM

## 2017-06-15 DIAGNOSIS — I5023 Acute on chronic systolic (congestive) heart failure: Secondary | ICD-10-CM

## 2017-06-15 DIAGNOSIS — Z95811 Presence of heart assist device: Secondary | ICD-10-CM

## 2017-06-15 LAB — TYPE AND SCREEN
ABO/RH(D): O POS
Antibody Screen: NEGATIVE
Unit division: 0
Unit division: 0
Unit division: 0
Unit division: 0

## 2017-06-15 LAB — RENAL FUNCTION PANEL
ANION GAP: 15 (ref 5–15)
Albumin: 2.8 g/dL — ABNORMAL LOW (ref 3.5–5.0)
BUN: 40 mg/dL — ABNORMAL HIGH (ref 6–20)
CHLORIDE: 93 mmol/L — AB (ref 101–111)
CO2: 21 mmol/L — AB (ref 22–32)
CREATININE: 4.85 mg/dL — AB (ref 0.61–1.24)
Calcium: 8.2 mg/dL — ABNORMAL LOW (ref 8.9–10.3)
GFR, EST AFRICAN AMERICAN: 15 mL/min — AB (ref >60)
GFR, EST NON AFRICAN AMERICAN: 13 mL/min — AB (ref >60)
Glucose, Bld: 108 mg/dL — ABNORMAL HIGH (ref 65–99)
POTASSIUM: 4.6 mmol/L (ref 3.5–5.1)
Phosphorus: 7.1 mg/dL — ABNORMAL HIGH (ref 2.5–4.6)
Sodium: 129 mmol/L — ABNORMAL LOW (ref 135–145)

## 2017-06-15 LAB — CBC WITH DIFFERENTIAL/PLATELET
Basophils Absolute: 0 10*3/uL (ref 0.0–0.1)
Basophils Relative: 0 %
Eosinophils Absolute: 0 10*3/uL (ref 0.0–0.7)
Eosinophils Relative: 0 %
HCT: 25.9 % — ABNORMAL LOW (ref 39.0–52.0)
Hemoglobin: 8.8 g/dL — ABNORMAL LOW (ref 13.0–17.0)
Lymphocytes Relative: 3 %
Lymphs Abs: 0.8 10*3/uL (ref 0.7–4.0)
MCH: 26.4 pg (ref 26.0–34.0)
MCHC: 34 g/dL (ref 30.0–36.0)
MCV: 77.8 fL — ABNORMAL LOW (ref 78.0–100.0)
Monocytes Absolute: 1.9 10*3/uL — ABNORMAL HIGH (ref 0.1–1.0)
Monocytes Relative: 8 %
Neutro Abs: 21.7 10*3/uL — ABNORMAL HIGH (ref 1.7–7.7)
Neutrophils Relative %: 89 %
Platelets: 158 10*3/uL (ref 150–400)
RBC: 3.33 MIL/uL — ABNORMAL LOW (ref 4.22–5.81)
RDW: 15 % (ref 11.5–15.5)
WBC: 24.5 10*3/uL — ABNORMAL HIGH (ref 4.0–10.5)

## 2017-06-15 LAB — BPAM RBC
Blood Product Expiration Date: 201811052359
Blood Product Expiration Date: 201811052359
Blood Product Expiration Date: 201811052359
Blood Product Expiration Date: 201811052359
ISSUE DATE / TIME: 201810080748
ISSUE DATE / TIME: 201810080748
Unit Type and Rh: 5100
Unit Type and Rh: 5100
Unit Type and Rh: 5100
Unit Type and Rh: 5100

## 2017-06-15 LAB — COMPREHENSIVE METABOLIC PANEL
ALT: 22 U/L (ref 17–63)
ANION GAP: 12 (ref 5–15)
AST: 52 U/L — AB (ref 15–41)
Albumin: 2.9 g/dL — ABNORMAL LOW (ref 3.5–5.0)
Alkaline Phosphatase: 99 U/L (ref 38–126)
BILIRUBIN TOTAL: 3.2 mg/dL — AB (ref 0.3–1.2)
BUN: 32 mg/dL — AB (ref 6–20)
CHLORIDE: 95 mmol/L — AB (ref 101–111)
CO2: 23 mmol/L (ref 22–32)
Calcium: 8.3 mg/dL — ABNORMAL LOW (ref 8.9–10.3)
Creatinine, Ser: 4.04 mg/dL — ABNORMAL HIGH (ref 0.61–1.24)
GFR, EST AFRICAN AMERICAN: 18 mL/min — AB (ref >60)
GFR, EST NON AFRICAN AMERICAN: 16 mL/min — AB (ref >60)
Glucose, Bld: 101 mg/dL — ABNORMAL HIGH (ref 65–99)
POTASSIUM: 4.4 mmol/L (ref 3.5–5.1)
Sodium: 130 mmol/L — ABNORMAL LOW (ref 135–145)
TOTAL PROTEIN: 6.5 g/dL (ref 6.5–8.1)

## 2017-06-15 LAB — GLUCOSE, CAPILLARY
GLUCOSE-CAPILLARY: 106 mg/dL — AB (ref 65–99)
GLUCOSE-CAPILLARY: 107 mg/dL — AB (ref 65–99)
GLUCOSE-CAPILLARY: 112 mg/dL — AB (ref 65–99)
GLUCOSE-CAPILLARY: 116 mg/dL — AB (ref 65–99)
Glucose-Capillary: 102 mg/dL — ABNORMAL HIGH (ref 65–99)
Glucose-Capillary: 133 mg/dL — ABNORMAL HIGH (ref 65–99)

## 2017-06-15 LAB — URINALYSIS, MICROSCOPIC (REFLEX)

## 2017-06-15 LAB — POCT I-STAT 3, ART BLOOD GAS (G3+)
Acid-base deficit: 2 mmol/L (ref 0.0–2.0)
BICARBONATE: 23.3 mmol/L (ref 20.0–28.0)
O2 SAT: 92 %
TCO2: 25 mmol/L (ref 22–32)
pCO2 arterial: 41.1 mmHg (ref 32.0–48.0)
pH, Arterial: 7.361 (ref 7.350–7.450)
pO2, Arterial: 68 mmHg — ABNORMAL LOW (ref 83.0–108.0)

## 2017-06-15 LAB — COOXEMETRY PANEL
Carboxyhemoglobin: 0.7 % (ref 0.5–1.5)
Carboxyhemoglobin: 0.6 % (ref 0.5–1.5)
Carboxyhemoglobin: 0.9 % (ref 0.5–1.5)
Methemoglobin: 1.8 % — ABNORMAL HIGH (ref 0.0–1.5)
Methemoglobin: 1.5 % (ref 0.0–1.5)
Methemoglobin: 1.2 % (ref 0.0–1.5)
O2 Saturation: 67.8 %
O2 Saturation: 47.6 %
O2 Saturation: 52.1 %
Total hemoglobin: 9 g/dL — ABNORMAL LOW (ref 12.0–16.0)
Total hemoglobin: 11 g/dL — ABNORMAL LOW (ref 12.0–16.0)
Total hemoglobin: 12.8 g/dL (ref 12.0–16.0)

## 2017-06-15 LAB — URINALYSIS, ROUTINE W REFLEX MICROSCOPIC
Bilirubin Urine: NEGATIVE
GLUCOSE, UA: NEGATIVE mg/dL
KETONES UR: NEGATIVE mg/dL
Nitrite: NEGATIVE
PROTEIN: 30 mg/dL — AB
Specific Gravity, Urine: 1.025 (ref 1.005–1.030)
pH: 5.5 (ref 5.0–8.0)

## 2017-06-15 LAB — POCT I-STAT, CHEM 8
BUN: 37 mg/dL — AB (ref 6–20)
CHLORIDE: 93 mmol/L — AB (ref 101–111)
Calcium, Ion: 1.08 mmol/L — ABNORMAL LOW (ref 1.15–1.40)
Creatinine, Ser: 5.1 mg/dL — ABNORMAL HIGH (ref 0.61–1.24)
GLUCOSE: 108 mg/dL — AB (ref 65–99)
HCT: 28 % — ABNORMAL LOW (ref 39.0–52.0)
Hemoglobin: 9.5 g/dL — ABNORMAL LOW (ref 13.0–17.0)
POTASSIUM: 4.6 mmol/L (ref 3.5–5.1)
SODIUM: 129 mmol/L — AB (ref 135–145)
TCO2: 23 mmol/L (ref 22–32)

## 2017-06-15 LAB — MAGNESIUM: Magnesium: 2.4 mg/dL (ref 1.7–2.4)

## 2017-06-15 LAB — VANCOMYCIN, RANDOM: VANCOMYCIN RM: 59 — AB

## 2017-06-15 LAB — CK: CK TOTAL: 309 U/L (ref 49–397)

## 2017-06-15 LAB — APTT
aPTT: 51 seconds — ABNORMAL HIGH (ref 24–36)
aPTT: 52 seconds — ABNORMAL HIGH (ref 24–36)

## 2017-06-15 LAB — IRON AND TIBC
Iron: 7 ug/dL — ABNORMAL LOW (ref 45–182)
SATURATION RATIOS: 4 % — AB (ref 17.9–39.5)
TIBC: 190 ug/dL — AB (ref 250–450)
UIBC: 183 ug/dL

## 2017-06-15 LAB — PHOSPHORUS: Phosphorus: 6.6 mg/dL — ABNORMAL HIGH (ref 2.5–4.6)

## 2017-06-15 LAB — PROTIME-INR
INR: 1.47
Prothrombin Time: 17.7 seconds — ABNORMAL HIGH (ref 11.4–15.2)

## 2017-06-15 LAB — HEPARIN LEVEL (UNFRACTIONATED)

## 2017-06-15 LAB — LACTATE DEHYDROGENASE: LDH: 376 U/L — AB (ref 98–192)

## 2017-06-15 LAB — SODIUM, URINE, RANDOM: SODIUM UR: 54 mmol/L

## 2017-06-15 MED ORDER — HEPARIN SODIUM (PORCINE) 1000 UNIT/ML DIALYSIS
1000.0000 [IU] | INTRAMUSCULAR | Status: DC | PRN
  Administered 2017-06-17: 4000 [IU] via INTRAVENOUS_CENTRAL
  Administered 2017-06-18: 2400 [IU] via INTRAVENOUS_CENTRAL
  Administered 2017-06-19: 4000 [IU] via INTRAVENOUS_CENTRAL
  Filled 2017-06-15: qty 6
  Filled 2017-06-15 (×2): qty 4
  Filled 2017-06-15 (×2): qty 6
  Filled 2017-06-15: qty 4
  Filled 2017-06-15: qty 6
  Filled 2017-06-15: qty 4

## 2017-06-15 MED ORDER — ATORVASTATIN CALCIUM 80 MG PO TABS
80.0000 mg | ORAL_TABLET | Freq: Every day | ORAL | Status: DC
  Administered 2017-06-15 – 2017-07-02 (×18): 80 mg via ORAL
  Filled 2017-06-15 (×18): qty 1

## 2017-06-15 MED ORDER — PRISMASOL BGK 4/2.5 32-4-2.5 MEQ/L IV SOLN
INTRAVENOUS | Status: DC
  Administered 2017-06-15 – 2017-06-19 (×5): via INTRAVENOUS_CENTRAL
  Filled 2017-06-15 (×9): qty 5000

## 2017-06-15 MED ORDER — FENTANYL CITRATE (PF) 100 MCG/2ML IJ SOLN
25.0000 ug | Freq: Once | INTRAMUSCULAR | Status: AC
  Administered 2017-06-15: 25 ug via INTRAVENOUS

## 2017-06-15 MED ORDER — SODIUM CHLORIDE 0.9 % FOR CRRT
INTRAVENOUS_CENTRAL | Status: DC | PRN
  Filled 2017-06-15: qty 1000

## 2017-06-15 MED ORDER — FENTANYL CITRATE (PF) 100 MCG/2ML IJ SOLN
INTRAMUSCULAR | Status: AC
  Filled 2017-06-15: qty 2

## 2017-06-15 MED ORDER — PIPERACILLIN-TAZOBACTAM 3.375 G IVPB 30 MIN
3.3750 g | Freq: Four times a day (QID) | INTRAVENOUS | Status: DC
  Administered 2017-06-15 – 2017-06-19 (×15): 3.375 g via INTRAVENOUS
  Filled 2017-06-15 (×16): qty 50

## 2017-06-15 MED ORDER — AMIODARONE IV BOLUS ONLY 150 MG/100ML
150.0000 mg | Freq: Once | INTRAVENOUS | Status: AC
  Administered 2017-06-15: 150 mg via INTRAVENOUS

## 2017-06-15 MED ORDER — PRISMASOL BGK 4/2.5 32-4-2.5 MEQ/L IV SOLN
INTRAVENOUS | Status: DC
  Administered 2017-06-15 – 2017-06-19 (×5): via INTRAVENOUS_CENTRAL
  Filled 2017-06-15 (×7): qty 5000

## 2017-06-15 MED ORDER — HYDRALAZINE HCL 20 MG/ML IJ SOLN
10.0000 mg | INTRAMUSCULAR | Status: DC | PRN
  Administered 2017-06-15 – 2017-06-28 (×19): 10 mg via INTRAVENOUS
  Filled 2017-06-15 (×20): qty 1

## 2017-06-15 MED ORDER — WARFARIN SODIUM 5 MG PO TABS
5.0000 mg | ORAL_TABLET | Freq: Every day | ORAL | Status: DC
  Administered 2017-06-15: 5 mg via ORAL
  Filled 2017-06-15: qty 1

## 2017-06-15 MED ORDER — METOLAZONE 5 MG PO TABS
5.0000 mg | ORAL_TABLET | Freq: Every day | ORAL | Status: DC
  Administered 2017-06-15: 5 mg via ORAL
  Filled 2017-06-15: qty 1

## 2017-06-15 MED ORDER — PRISMASOL BGK 4/2.5 32-4-2.5 MEQ/L IV SOLN
INTRAVENOUS | Status: DC
  Administered 2017-06-15 – 2017-06-17 (×18): via INTRAVENOUS_CENTRAL
  Filled 2017-06-15 (×28): qty 5000

## 2017-06-15 MED FILL — Heparin Sodium (Porcine) Inj 1000 Unit/ML: INTRAMUSCULAR | Qty: 30 | Status: AC

## 2017-06-15 MED FILL — Magnesium Sulfate Inj 50%: INTRAMUSCULAR | Qty: 10 | Status: AC

## 2017-06-15 MED FILL — Sodium Chloride IV Soln 0.9%: INTRAVENOUS | Qty: 250 | Status: AC

## 2017-06-15 MED FILL — Potassium Chloride Inj 2 mEq/ML: INTRAVENOUS | Qty: 40 | Status: AC

## 2017-06-15 MED FILL — Phenylephrine HCl Inj 10 MG/ML: INTRAMUSCULAR | Qty: 2 | Status: AC

## 2017-06-15 NOTE — Progress Notes (Addendum)
Physical Therapy Treatment Patient Details Name: [name] MRN: [id number] DOB: [birth year] Today's Date: 06/15/2017    History of Present Illness 51 yo with history of CAD s/p anterior MI in 2010 and chronic systolic CHF (ischemic cardiomyopathy) as well as CKD stage III now s/p HeartMate 3 VAD placement.  PMH includes:  ASCVD,  CHF, Chron's disease, DVT, gastroparesis; HTN, ischemic cardiomyopathy DM; s/p cardiac debriblator placement.     PT Comments    Pt admitted with above diagnosis. Pt currently with functional limitations due to balance and endurance deficits. Pt was able to ambulate with Harmon Pier walker with mod assist to min assist of 2 persons for physical assist and RT for nitric oxide and tech followed with chair.  Pt able to take three walks 20 feet, then 34 feet and then 54 feet.  Will follow acutely.  VSS needing 6LO2 with nitric oxide in place.  O2 dropped with ambulation unless pt pursed lip breathing.  4.3 flow, 5650 speed, 3.3 to 3.0 PI, 4.2 power on LVAD parameters.  Pt was educated on batteries and equipment and will begin to perform techniques next visit.   Pt will benefit from skilled PT to increase their independence and safety with mobility to allow discharge to the venue listed below.     Follow Up Recommendations  Home health PT;Supervision/Assistance - 24 hour     Equipment Recommendations  Other (comment) (rollator)    Recommendations for Other Services       Precautions / Restrictions Precautions Precautions: Fall;Sternal Precaution Comments: LVAD, chest tube, Nitric oxide    Mobility  Bed Mobility                  Transfers Overall transfer level: Needs assistance Equipment used:  H[place]on Pier walker) Transfers: Sit to/from Stand Sit to Stand: Mod assist;+2 physical assistance         General transfer comment: Pt able to stand up holding onto pillow or arms across chest using LEs with cues.  Pt did better with sit to stand with each attempt.     Ambulation/Gait Ambulation/Gait assistance: Min assist;+2 physical assistance;Mod assist (2 extra persons one for chair follow and RT with Nitric oxid) Ambulation Distance (Feet): 108 Feet (20 feet, 34 feet then 54 feet) Assistive device:  (EVa walker) Gait Pattern/deviations: Step-to pattern;Step-through pattern;Decreased step length - right;Decreased step length - left;Decreased stride length;Shuffle;Leaning posteriorly;Staggering left;Staggering right;Trunk flexed;Wide base of support   Gait velocity interpretation: Below normal speed for age/gender General Gait Details: Pt began with shuffle steps but gradually did better and better with ambulation.  Needed cues to stand tall and take larger steps. Pt needed seated rest breaks and cues for pursed lip breathing.    Stairs            Wheelchair Mobility    Modified Rankin (Stroke Patients Only)       Balance Overall balance assessment: Needs assistance Sitting-balance support: No upper extremity supported;Feet supported Sitting balance-[name]: Poor Sitting balance - Comments: Needed min guard to min assist to sit EOC.  Pt with flexed head and neck.     Standing balance support: Bilateral upper extremity supported;During functional activity Standing balance-[name]: Poor Standing balance comment: requires +2 assist to stand at with EVa walker with anterior lean.                             Cognition Arousal/Alertness: Awake/alert Behavior During Therapy: Flat affect Overall  Cognitive Status: Impaired/Different from baseline Area of Impairment: Problem solving;Safety/judgement;Following commands                       Following Commands: Follows one step commands with increased time Safety/Judgement: Decreased awareness of safety   Problem Solving: Slow processing;Decreased initiation;Difficulty sequencing;Requires verbal cues;Requires tactile cues        Exercises      General  Comments   Co rx with OT for safety with mobility.      Pertinent Vitals/Pain Pain Assessment: Faces Faces Pain Scale: Hurts even more Pain Location: chest  Pain Descriptors / Indicators: Grimacing;Guarding Pain Intervention(s): Limited activity within patient's tolerance;Monitored during session;Repositioned    Home Living                      Prior Function            PT Goals (current goals can now be found in the care plan section) Progress towards PT goals: Progressing toward goals    Frequency    Min 3X/week      PT Plan Current plan remains appropriate    Co-evaluation              AM-PAC PT "6 Clicks" Daily Activity  Outcome Measure  Difficulty turning over in bed (including adjusting bedclothes, sheets and blankets)?: Unable Difficulty moving from lying on back to sitting on the side of the bed? : Unable Difficulty sitting down on and standing up from a chair with arms (e.g., wheelchair, bedside commode, etc,.)?: A Lot Help needed moving to and from a bed to chair (including a wheelchair)?: A Lot Help needed walking in hospital room?: A Lot Help needed climbing 3-5 steps with a railing? : Total 6 Click Score: 9    End of Session Equipment Utilized During Treatment: Gait belt;Oxygen (and nitric oxide) Activity Tolerance: Patient limited by fatigue;Patient limited by pain Patient left: with call bell/phone within reach;in chair;with family/visitor present Nurse Communication: Mobility status PT Visit Diagnosis: Unsteadiness on feet (R26.81);Muscle weakness (generalized) (M62.81)     Time: 1055-1140 PT Time Calculation (min) (ACUTE ONLY): 45 min  Charges:  $Gait Training: 23-37 mins                    G Codes:       Licia Harl,PT Acute Rehabilitation [phone] (pager)    Denice Paradise 06/15/2017, 12:06 PM

## 2017-06-15 NOTE — Progress Notes (Addendum)
Pharmacy Antibiotic Note  [name] is a 51 y.o. male admitted on 06/07/2017 with surgical prophylaxis.  Pharmacy has been consulted for vancomycin dosing x 48 hrs post-op. Renal function worsening acutely with SCr up to 2.7, WBC up - per MD add Zosyn and continue vancomycin for now.   SCr continues to rise with minimal UOP, pt may require CVVHD.  Plan: -Hold vancomycin - check random level in morning -Follow renal plans -Continue Zosyn 3.375g IV EI q8h  ADDENDUM: Initiating CRRT, vanc random ordered by MD elevated at 59 > not reflective of true trough as next dose was due 12 hours after random level drawn.  Plan: -Continue to hold vancomycin -Adjust Zosyn to 3.375g IV q6h   Height: 5' 8"  (172.7 cm) Weight: 220 lb 14.4 oz (100.2 kg) (with controller) IBW/kg (Calculated) : 68.4  Temp (24hrs), Avg:98 F (36.7 C), Min:97.5 F (36.4 C), Max:98.6 F (37 C)   Recent Labs Lab 06/12/17 2259  06/13/17 0320 06/13/17 1548 06/13/17 1700 06/14/17 0310 06/14/17 1539 06/15/17 0312  WBC 14.7*  --  15.8*  --  24.4* 25.7*  --  24.5*  CREATININE 1.52*  < > 1.62* 2.00* 2.08* 2.75* 3.60* 4.04*  < > = values in this interval not displayed.  Estimated Creatinine Clearance: 24.5 mL/min (A) (by C-G formula based on SCr of 4.04 mg/dL (H)).    Allergies  Allergen Reactions  . Metformin And Related Diarrhea    Antimicrobials this admission: Vancomycin 10/8 >>  Cefuroxime 10/8 >>10/10 Zosyn 10/10 >>  Dose adjustments this admission: 10/10: SCr up> vancomycin to q12h 10/11: SCr up > hold vancomycin 1011 VR 59 > hold vancomycin  Microbiology results: 10/5 MRSA PCR: neg  Thank you for allowing pharmacy to be a part of this patient's care.  Arrie Senate, PharmD PGY-2 Cardiology Pharmacy Resident Pager: [phone] 06/15/2017

## 2017-06-15 NOTE — Progress Notes (Addendum)
Patient ID: [name], male   DOB: 1966-07-02, 51 y.o.   MRN: [id number]   Advanced Heart Failure VAD Team Note  Subjective:    Admitted 10/3 for RHC/Swan implantation and optimization prior to LVAD.  HeartMate 3 VAD implanted 10/8.   Remains on milrinone 0.375 mcg, epi 0.5 mcg, dopamine 3, amio 30 mg per hour, NO 4 PPM.  Started on lasix drip 15 mg per hour. Poor urine output.   Creatinine trending up 2.7> 4.  Todays CO-OX is 68%.   Yesterday had N/V and was NPO.  Denies SOB.   LVAD INTERROGATION:  HeartMate 3:  Flow 4.3liters/min, speed 5600 rpm, power 4.2 , PI 3.4 Rare PI events.  Objective:    Vital Signs:   Temp:  [97.5 F (36.4 C)-98.6 F (37 C)] 98.6 F (37 C) (10/11 0315) Pulse Rate:  [97-110] 110 (10/11 0700) Resp:  [13-32] 24 (10/11 0700) BP: (98-139)/(77-112) 131/106 (10/11 0700) SpO2:  [90 %-100 %] 93 % (10/11 0700) Weight:  [220 lb 14.4 oz (100.2 kg)] 220 lb 14.4 oz (100.2 kg) (10/11 0630) Last BM Date: 06/11/17 Mean arterial Pressure 80s-91.   Intake/Output:   Intake/Output Summary (Last 24 hours) at 06/15/17 0736 Last data filed at 06/15/17 0700  Gross per 24 hour  Intake          2405.13 ml  Output             1010 ml  Net          1395.13 ml     Physical Exam  Physical Exam: CVP 14.  GENERAL: Appears fatigued. NAD. In the chair. HEENT: normal  NECK: Supple, JVP 13-14.  2+ bilaterally, no bruits.  No lymphadenopathy or thyromegaly appreciated.   CARDIAC:  Mechanical heart sounds with LVAD hum present. Sternal incsion approximated.  LUNGS:  Decreased in the bases.   ABDOMEN:  Soft, round, nontender, positive bowel sounds x4.     LVAD exit site:   Dressing dry and intact.  No erythema or drainage.  Driveline dressing is being changed daily per sterile technique. EXTREMITIES:  Warm and dry, no cyanosis, clubbing, rash or edema . R radial A line NEUROLOGIC:  Alert and oriented x 4.  Gait steady.  No aphasia.  No dysarthria.  Affect pleasant.        Telemetry   Sinus Tach 110s.   Labs   Basic Metabolic Panel:  Recent Labs Lab 06/12/17 0210  06/12/17 1830 06/12/17 2259  06/13/17 0320 06/13/17 1548 06/13/17 1700 06/14/17 0310 06/14/17 1539 06/15/17 0312  NA 133*  < > 134*  --   < > 134* 133*  --  130* 131* 130*  K 3.6  < > 3.9  --   < > 3.9 5.2*  --  4.9 4.5 4.4  CL 99*  < > 104  --   < > 103 99*  --  97* 95* 95*  CO2 27  --  22  --   --  22  --   --  21*  --  23  GLUCOSE 118*  < > 141*  --   < > 128* 175*  --  220* 148* 101*  BUN 25*  < > 18  --   < > 19 21*  --  25* 29* 32*  CREATININE 1.54*  < > 1.34* 1.52*  < > 1.62* 2.00* 2.08* 2.75* 3.60* 4.04*  CALCIUM 8.1*  --  8.0*  --   --  8.3*  --   --  8.4*  --  8.3*  MG  --   --  2.9* 2.7*  --  2.5*  --  2.4 2.4  --  2.4  PHOS  --   --   --   --   --  3.1  --   --  5.9*  --  6.6*  < > = values in this interval not displayed.  Liver Function Tests:  Recent Labs Lab 06/11/17 0636 06/12/17 0210 06/13/17 0320 06/14/17 0310 06/15/17 0312  AST 41 31 89* 78* 52*  ALT 48 36 24 24 22   ALKPHOS 117 98 71 83 99  BILITOT 0.7 0.5 2.2* 2.5* 3.2*  PROT 7.2 6.0* 5.7* 6.3* 6.5  ALBUMIN 3.1* 2.6* 3.4* 3.2* 2.9*   No results for input(s): LIPASE, AMYLASE in the last 168 hours. No results for input(s): AMMONIA in the last 168 hours.  CBC:  Recent Labs Lab 06/11/17 0636 06/12/17 0210  06/12/17 2259  06/13/17 0320 06/13/17 1548 06/13/17 1700 06/14/17 0310 06/14/17 1539 06/15/17 0312  WBC 11.2* 9.9  < > 14.7*  --  15.8*  --  24.4* 25.7*  --  24.5*  NEUTROABS 8.5* 6.6  --   --   --  13.4*  --   --  22.6*  --  21.7*  HGB 9.9* 8.5*  < > 9.0*  < > 9.6* 10.2* 9.6* 8.8* 10.2* 8.8*  HCT 31.8* 27.5*  < > 27.5*  < > 28.9* 30.0* 28.8* 27.0* 30.0* 25.9*  MCV 82.2 80.9  < > 78.8  --  78.3  --  78.9 79.2  --  77.8*  PLT 213 188  < > 111*  --  123*  --  133* 125*  --  158  < > = values in this interval not displayed.  INR:  Recent Labs Lab 06/12/17 1251 06/12/17 2137  06/13/17 0320 06/14/17 0310 06/15/17 0312  INR 1.44 1.28 1.23 1.44 1.47    Other results:  EKG:    Imaging   Dg Chest Port 1 View  Result Date: 06/14/2017 CLINICAL DATA:  Insertion of implantable left ventricular assist device 2 days ago. EXAM: PORTABLE CHEST 1 VIEW COMPARISON:  Portable chest x-ray of June 13, 2017 FINDINGS: The trachea and esophagus have been extubated. The lungs are mildly hypoinflated. There is density at the left lung base little changed from the previous study. There is no large pleural effusion and no pneumothorax. The cardiac silhouette is enlarged. The Swan-Ganz catheter tip projects over the proximal right main pulmonary artery. The ICD is in stable position. The left ventricular assist device also appears stable. The mediastinal drain and the left chest tube are in stable position. The right internal jugular venous catheter tip projects over the cavoatrial junction. The sternal wires are intact. IMPRESSION: Fairly stable appearance of the chest since extubation. Persistent left lower lobe atelectasis or pneumonia. Small bilateral pleural effusions. No pneumothorax. Mild cardiomegaly. Decreased engorgement of the pulmonary vascularity. The support tubes and devices are in reasonable position. Electronically Signed   By: David  Martinique M.D.   On: 06/14/2017 08:11     Medications:     Scheduled Medications: . acetaminophen  1,000 mg Oral Q6H   Or  . acetaminophen (TYLENOL) oral liquid 160 mg/5 mL  1,000 mg Per Tube Q6H  . aspirin EC  325 mg Oral Daily   Or  . aspirin  324 mg Per Tube Daily   Or  . aspirin  300 mg Rectal Daily  .  bisacodyl  10 mg Oral Daily   Or  . bisacodyl  10 mg Rectal Daily  . chlorhexidine  15 mL Mouth Rinse BID  . Chlorhexidine Gluconate Cloth  6 each Topical Daily  . docusate sodium  200 mg Oral Daily  . insulin aspart  0-24 Units Subcutaneous Q4H  . insulin aspart  3 Units Subcutaneous TID WC  . insulin detemir  15 Units  Subcutaneous BID  . mouth rinse  15 mL Mouth Rinse q12n4p  . metoCLOPramide (REGLAN) injection  10 mg Intravenous Q6H  . mupirocin ointment   Nasal BID  . pantoprazole  40 mg Oral Daily  . sodium chloride flush  10-40 mL Intracatheter Q12H  . sodium chloride flush  3 mL Intravenous Q12H  . warfarin  2.5 mg Oral q1800  . Warfarin - Physician Dosing Inpatient   Does not apply q1800    Infusions: . sodium chloride Stopped (06/14/17 1400)  . sodium chloride Stopped (06/13/17 1100)  . sodium chloride Stopped (06/13/17 1300)  . amiodarone 30 mg/hr (06/15/17 0700)  . DOPamine 3 mcg/kg/min (06/15/17 0700)  . EPINEPHrine 4 mg in dextrose 5% 250 mL infusion (16 mcg/mL) 0.5 mcg/min (06/15/17 0700)  . furosemide (LASIX) infusion 15 mg/hr (06/15/17 0700)  . heparin 700 Units/hr (06/15/17 0700)  . lactated ringers Stopped (06/14/17 1400)  . lactated ringers 10 mL/hr at 06/15/17 0700  . milrinone 0.375 mcg/kg/min (06/15/17 0700)  . nitroGLYCERIN Stopped (06/13/17 1700)  . norepinephrine (LEVOPHED) Adult infusion Stopped (06/14/17 0630)  . piperacillin-tazobactam (ZOSYN)  IV Stopped (06/15/17 0400)  . vancomycin Stopped (06/14/17 2337)    PRN Medications: sodium chloride, midazolam, morphine injection, ondansetron (ZOFRAN) IV, oxyCODONE, promethazine, sodium chloride flush, sodium chloride flush, traMADol   Patient Profile   51 yo with history of CAD s/p anterior MI in 2010 and chronic systolic CHF (ischemic cardiomyopathy) as well as CKD stage III now s/p HeartMate 3 VAD placement.   Assessment/Plan:    1. Acute on chronic systolic CHF: Ischemic cardiomyopathy. St Jude ICD. NSTEMI in March 2018 with DES to LAD and RCA, complicated by cardiogenic shock, low output requiring milrinone. Most recent echo 8/18 with EF 20-25%, moderate MR. NYHA class IIIb-IVsymptoms (fluctuate).He was admitted and started on milrinone with improved creatinine.  HeartMate 3 VAD placement on 10/8.  -Remains  on milrinone 0.3 mcg, epi 0.3 mcg, dopamine 3, and lasix drip 15 mg per hour. - Renal function continues to worsen. Will need renal to consult. May need CRRT -On warfarin and heparin. Dosing per CT surgery.  - Continue warfarin with INR goal 2-3 (aim slightly higher with h/o Factor V Leiden heterozygosity) and ASA 325.  2. AKI on CKD stage 3:  -Creatinine trending up 1.6>2.7> 4.   - ? ATN component with VT post op.  3. CAD: NSTEMI in 3/18. LHC with 99% ulcerated lesion proximal RCA with left to right collaterals, 95% mid LAD stenosis after mid LAD stent. s/p PCI to RCA and LAD on 11/25/16.  - Off plavix for LVAD.  - Restart statin.  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 Recent V/Q scan did not show acute or chronic PE.  - He is a heterozygote for Factor V Leiden, plan for for ASA 325 and warfarin INR 2-3 with LVAD. No change.  5. Chronic N/V/D with gastroparesis:  NPO with N/V.   6. DM: On Jardiance at home, will cover with sliding scale. Stop scheduled insulin as he is NPO. DM coordinator following. Glucose ok.  7. S/P Multiple Tooth Extractions 4-15, 19-23, and 27-29. Stable. 8. Rhythm: Frequent ventricular ectopy.  - Continue amio drip 30 mg per hour.   9. Leukocytosis: CXR with LLL atelectasis versus infiltrate.  - Afebrile.  - WBC 15.8 -> 25.7->24. Likely inflammatory as he has been on post-op ABX with vanc and Zosyn.   I reviewed the LVAD parameters from today, and compared the results to the patient's prior recorded data.  No programming changes were made.  The LVAD is functioning within specified parameters.  The patient performs LVAD self-test daily.  LVAD interrogation was negative for any significant power changes, alarms or PI events/speed drops.  LVAD equipment check completed and is in good working order.  Back-up equipment present.   LVAD education done on emergency procedures and precautions and reviewed exit site care.  Length of Stay: [place],  NP 06/15/2017, 7:36 AM  VAD Team --- VAD ISSUES ONLY--- Pager [phone] (7am - 7am)  Advanced Heart Failure Team  Pager [phone] (M-F; 7a - 4p)  Please contact [facility] for night-coverage after hours (4p -7a ) and weekends on [email or web address]  Patient seen with NP, agree with the above note.  I performed an independent exam and assessment, I made appropriate adjustments to the above note.   Patient is hemodynamically stable.  Co-ox 68%, CVP up to 14.  He remains on very low dose epinephrine 0.3, dopamine 3, milrinone 0.375, Lasix 15 mg/hr, NO 4 ppm, amiodarone 30.  However, poor urine outpatient with rise in creatinine to 4 (pre-op 1.5).  He has not been hypotensive that we have noted but suspect peri-op ATN.  Would favor a brief course of CVVH to decompress RV and lower renal venous pressure, hopefully allow recovery from ATN.  Will consult renal service today.   Would favor sildenafil for RV support after tapering off NO.   Speed increased to 5600 rpm.   WBCs stable to downtrending, no fever.  On empiric abx.   He is now on heparin gtt + warfarin. LDH stable.   CRITICAL CARE Performed by: Loralie Champagne  Total critical care time: 40 minutes  Critical care time was exclusive of separately billable procedures and treating other patients.  Critical care was necessary to treat or prevent imminent or life-threatening deterioration.  Critical care was time spent personally by me on the following activities: development of treatment plan with patient and/or surrogate as well as nursing, discussions with consultants, evaluation of patient's response to treatment, examination of patient, obtaining history from patient or surrogate, ordering and performing treatments and interventions, ordering and review of laboratory studies, ordering and review of radiographic studies, pulse oximetry and re-evaluation of patient's condition.  Edrei Norgaard 06/15/2017 8:28 AM

## 2017-06-15 NOTE — Progress Notes (Signed)
CSW met with patient at bedside today. Patient states he is tired but feeling "OK". Patient sitting up in chair and will attempt to walk the hall later today. CSW will continue to follow for support throughout implant hospitalization. Raquel Sarna, [place], Will

## 2017-06-15 NOTE — Progress Notes (Signed)
Assisted PT/OT w/ walking pt in hallway on Nitric- 2 ppm and [place] between 5-6 lpm Funk.  No apparent complications.

## 2017-06-15 NOTE — Progress Notes (Signed)
Occupational Therapy Treatment Patient Details Name: [name] MRN: [id number] DOB: March 23, 1966 Today's Date: 06/15/2017    History of present illness 51 yo with history of CAD s/p anterior MI in 2010 and chronic systolic CHF (ischemic cardiomyopathy) as well as CKD stage III now s/p HeartMate 3 VAD placement.  PMH includes:  ASCVD,  CHF, Chron's disease, DVT, gastroparesis; HTN, ischemic cardiomyopathy DM; s/p cardiac debriblator placement.    OT comments  Pt seen with PT.  Pt was able to ambulate with EVA walker and mod A +2, RN present, and RT for nitric oxide.  He was able to walk 38f , then 34 ft, and 533f- required 3 seated rest breaks.  VSS.  He required 6L supplemental 02 with nitric oxide.  DOE 3/4.  4.3 flow, 5650 speed, 3.3 to 3.0 PI.   Began instruction with pt re: switching AC <> battery, line safety, and managing equipment.   Will continue to follow.   Follow Up Recommendations  Home health OT;Supervision/Assistance - 24 hour    Equipment Recommendations  3 in 1 bedside commode    Recommendations for Other Services      Precautions / Restrictions Precautions Precautions: Fall;Sternal Precaution Comments: LVAD, chest tube, Nitric oxide       Mobility Bed Mobility               General bed mobility comments: up in recliner   Transfers Overall transfer level: Needs assistance Equipment used:  (EHarmon Pieralker) Transfers: Sit to/from Stand Sit to Stand: Mod assist;+2 physical assistance         General transfer comment: Pt able to stand up holding onto pillow or arms across chest using LEs with cues.  Pt did better with sit to stand with each attempt.      Balance Overall balance assessment: Needs assistance Sitting-balance support: No upper extremity supported;Feet supported Sitting balance-[name]: Poor Sitting balance - Comments: Needed min guard to min assist to sit EOC.  Pt with flexed head and neck.     Standing balance support:  Bilateral upper extremity supported;During functional activity Standing balance-[name]: Poor Standing balance comment: requires +2 assist to stand at with EVa walker with anterior lean.                            ADL either performed or assessed with clinical judgement   ADL                           Toilet Transfer: Moderate assistance;+2 for physical assistance;Stand-pivot;BSC           Functional mobility during ADLs: Moderate assistance;+2 for physical assistance       Vision       Perception     Praxis      Cognition Arousal/Alertness: Awake/alert Behavior During Therapy: Flat affect Overall Cognitive Status: Impaired/Different from baseline Area of Impairment: Problem solving;Safety/judgement;Following commands                       Following Commands: Follows one step commands with increased time Safety/Judgement: Decreased awareness of safety   Problem Solving: Slow processing;Decreased initiation;Difficulty sequencing;Requires verbal cues;Requires tactile cues          Exercises     Shoulder Instructions       General Comments      Pertinent Vitals/ Pain  Pain Assessment: Faces Faces Pain Scale: Hurts even more Pain Location: chest  Pain Descriptors / Indicators: Grimacing;Guarding Pain Intervention(s): Monitored during session;Limited activity within patient's tolerance  Home Living                                          Prior Functioning/Environment              Frequency  Min 3X/week        Progress Toward Goals  OT Goals(current goals can now be found in the care plan section)  Progress towards OT goals: Progressing toward goals     Plan Discharge plan remains appropriate;Frequency needs to be updated    Co-evaluation    PT/OT/SLP Co-Evaluation/Treatment: Yes Reason for Co-Treatment: Complexity of the patient's impairments (multi-system involvement);For  patient/therapist safety PT goals addressed during session: Mobility/safety with mobility OT goals addressed during session: Strengthening/ROM      AM-PAC PT "6 Clicks" Daily Activity     Outcome Measure   Help from another person eating meals?: A Little Help from another person taking care of personal grooming?: A Little Help from another person toileting, which includes using toliet, bedpan, or urinal?: A Lot Help from another person bathing (including washing, rinsing, drying)?: A Lot Help from another person to put on and taking off regular upper body clothing?: A Lot Help from another person to put on and taking off regular lower body clothing?: Total 6 Click Score: 13    End of Session Equipment Utilized During Treatment: Oxygen  OT Visit Diagnosis: Unsteadiness on feet (R26.81);Pain Pain - part of body:  (chest/incisional )   Activity Tolerance Patient tolerated treatment well   Patient Left in chair;with call bell/phone within reach;with nursing/sitter in room;with family/visitor present   Nurse Communication Mobility status        Time: 7829-5621 OT Time Calculation (min): 42 min  Charges: OT General Charges $OT Visit: 1 Visit OT Treatments $Therapeutic Activity: 8-22 mins  [facility], OTR/L [phone]    Lucille Passy M 06/15/2017, 12:28 PM

## 2017-06-15 NOTE — Progress Notes (Signed)
LVAD Coordinator Rounding Note:  Admitted 06/07/2017 for hemodynamic optimization prior to LVAD placement. Full dental extractions prior to LVAD implant.  HM3LVAD implated on 10/8/2018by Dr Craige Cotta Destination Therapycriteria.  Vital signs: HR: 108 A-line: 120/82 (94) Doppler: 108 reflective of modified systolic BP Auto cuff: 349/61 (89) O2 Sat: 92% on 5LNC NO: 2 PPM Wt:194>217>220>220 lbs   LVAD interrogation reveals:   Speed:5600 Flow: 4.3 Power: 4.2w PI: 3.3 Alarms: none Events: none Hematocrit: 26 Fixed speed: 5400 Low speed limit: 5100  10/8>  61 PI events 10/8> 1 low flow alarm 10/9> 4 PI events   Drive Line: Daily. Change daily per protocol using daily kits and silver strip.   Labs:  LDH trend:255>343>379>376  INR trend: 1.44>1.23>1.44>1.47  Anticoagulation Plan: -INR Goal: 2-3, warfarin restarted 10/9 -ASA Dose: 341m (will remain on this with factor V heterozygosity)  Blood Products:  - 2 units FFP in OR - 1 unit PC in OR  Gtts: - Amiodarone at 30 mg/hr - Dopamine at 3 mcg/kg/min - Epinephrine - off this am - Milrinone 0.375 mcg/kg/min - Lasix @ 15 mg/hr - Heparin per pharmacy   Device: -St Jude  -Therapies: off. Perm pacer set at 60 bpm. -Temp pacer set  AAI at 88 bpm, MA-5  Arrythmias: frequent PVCs; Amiodarone started 09/12/16  Respiratory: extubated 10/9   Renal:  Creat trend: 1.54>1.62>2.75>4.0   Adverse Events on VAD: -none  Plan/Recommendations:   1. Please change drive-line dressing daily using daily dressing kits and silver strips. If Katrina (daughter) is available, ask her to observe dressing changes. 2. Mother and young son at beside this am; attempted to demonstrate changing  2. Please page VAD pager with any equipment concerns.  MZada GirtRN, VAD Coordinator 24/7 pager [phone]

## 2017-06-15 NOTE — Progress Notes (Signed)
HeartMate 3 Rounding Note   postop day #3  Subjective:    HeartMate 3 implantation October 8 with preoperative ischemic cardiomyopathy, advanced heart failure on IV inotropes, indication was destination therapy  Patient stable postop day 1. Extubated  A.m. Postop day 1 Stable LVAD parameters with flow 4.2 L/m.    Acute on chronic renal failure with wt gain, increased volume, increased pulsatility- VAD speed up to 5600  Oliguric Acute renal failure with rising cvp- trialysis cath placed today and CRT started IV heparin low-dose with history of DVT and factor V Leiden deficiency. PTT 55 INR slowly rising with po coumadin    LVAD INTERROGATION:  HeartMate IIILVAD:  Flow 4.3 liters/min, speed 5600, power 4.0, PI 3.0.  Controller Serial intact.   Objective:    Vital Signs:   Temp:  [97.9 F (36.6 C)-98.6 F (37 C)] 98.1 F (36.7 C) (10/11 1200) Pulse Rate:  [99-114] 104 (10/11 1500) Resp:  [9-32] 9 (10/11 1500) BP: (98-139)/(72-112) 117/97 (10/11 1500) SpO2:  [90 %-100 %] 95 % (10/11 1500) Weight:  [220 lb 14.4 oz (100.2 kg)] 220 lb 14.4 oz (100.2 kg) (10/11 0630) Last BM Date: 06/11/17 Mean arterial Pressure 85 mmHg  Intake/Output:   Intake/Output Summary (Last 24 hours) at 06/15/17 1556 Last data filed at 06/15/17 1500  Gross per 24 hour  Intake          2137.41 ml  Output              670 ml  Net          1467.41 ml     Physical Exam: General:  Well appearing. No resp difficulty HEENT: normal Neck: supple. JVP .; no bruits. No lymphadenopathy or thryomegaly appreciated. Cor: Mechanical heart sounds with LVAD hum present. Lungs: clear Abdomen: soft, nontender, nondistended. No hepatosplenomegaly. No bruits or masses. Good bowel sounds. Extremities: no cyanosis, clubbing, rash, mild edema of the extremities edema Neuro: alert & orientedx3, cranial nerves grossly intact. moves all 4 extremities w/o difficulty. Affect pleasant  Telemetry: A paced at 88/m  Labs: Basic  Metabolic Panel:  Recent Labs Lab 06/12/17 0210  06/12/17 1830 06/12/17 2259  06/13/17 0320 06/13/17 1548 06/13/17 1700 06/14/17 0310 06/14/17 1539 06/15/17 0312  NA 133*  < > 134*  --   < > 134* 133*  --  130* 131* 130*  K 3.6  < > 3.9  --   < > 3.9 5.2*  --  4.9 4.5 4.4  CL 99*  < > 104  --   < > 103 99*  --  97* 95* 95*  CO2 27  --  22  --   --  22  --   --  21*  --  23  GLUCOSE 118*  < > 141*  --   < > 128* 175*  --  220* 148* 101*  BUN 25*  < > 18  --   < > 19 21*  --  25* 29* 32*  CREATININE 1.54*  < > 1.34* 1.52*  < > 1.62* 2.00* 2.08* 2.75* 3.60* 4.04*  CALCIUM 8.1*  --  8.0*  --   --  8.3*  --   --  8.4*  --  8.3*  MG  --   --  2.9* 2.7*  --  2.5*  --  2.4 2.4  --  2.4  PHOS  --   --   --   --   --  3.1  --   --  5.9*  --  6.6*  < > = values in this interval not displayed.  Liver Function Tests:  Recent Labs Lab 06/11/17 0636 06/12/17 0210 06/13/17 0320 06/14/17 0310 06/15/17 0312  AST 41 31 89* 78* 52*  ALT 48 36 24 24 22   ALKPHOS 117 98 71 83 99  BILITOT 0.7 0.5 2.2* 2.5* 3.2*  PROT 7.2 6.0* 5.7* 6.3* 6.5  ALBUMIN 3.1* 2.6* 3.4* 3.2* 2.9*   No results for input(s): LIPASE, AMYLASE in the last 168 hours. No results for input(s): AMMONIA in the last 168 hours.  CBC:  Recent Labs Lab 06/11/17 0636 06/12/17 0210  06/12/17 2259  06/13/17 0320 06/13/17 1548 06/13/17 1700 06/14/17 0310 06/14/17 1539 06/15/17 0312  WBC 11.2* 9.9  < > 14.7*  --  15.8*  --  24.4* 25.7*  --  24.5*  NEUTROABS 8.5* 6.6  --   --   --  13.4*  --   --  22.6*  --  21.7*  HGB 9.9* 8.5*  < > 9.0*  < > 9.6* 10.2* 9.6* 8.8* 10.2* 8.8*  HCT 31.8* 27.5*  < > 27.5*  < > 28.9* 30.0* 28.8* 27.0* 30.0* 25.9*  MCV 82.2 80.9  < > 78.8  --  78.3  --  78.9 79.2  --  77.8*  PLT 213 188  < > 111*  --  123*  --  133* 125*  --  158  < > = values in this interval not displayed.  INR:  Recent Labs Lab 06/12/17 1251 06/12/17 2137 06/13/17 0320 06/14/17 0310 06/15/17 0312  INR 1.44 1.28  1.23 1.44 1.47    Other results:  EKG:   Imaging: Dg Chest Port 1 View  Result Date: 06/15/2017 CLINICAL DATA:  CVL placement. EXAM: PORTABLE CHEST 1 VIEW COMPARISON:  Chest x-ray from same day at 05:51. FINDINGS: Interval placement of a right subclavian central venous catheter with the tip in the left brachiocephalic vein. Left chest wall AICD and LVAD device are unchanged in position. Left-sided chest tubes and mediastinal drain are also unchanged. Right internal jugular sheath again noted. Stable cardiomegaly. Low lung volumes. Unchanged perihilar opacities and left pleural effusion with adjacent left basilar opacity. Right basilar atelectasis appears unchanged. IMPRESSION: 1. Interval placement of a right subclavian central venous catheter with the tip projecting over the left brachiocephalic vein. Recommend repositioning. 2. Stable moderate interstitial pulmonary edema and left pleural effusion with adjacent left lung base airspace disease, atelectasis versus infiltrate. These results will be called to the ordering clinician or representative by the Radiologist Assistant, and communication documented in the PACS or zVision Dashboard. Electronically Signed   By: Titus Dubin M.D.   On: 06/15/2017 13:22   Dg Chest Port 1 View  Result Date: 06/15/2017 CLINICAL DATA:  Left ventricular assist device.  Chest soreness. EXAM: PORTABLE CHEST 1 VIEW COMPARISON:  1 day prior FINDINGS: Pacer/AICD device with leads at right atrium and right ventricle. Interval removal of Swan-Ganz catheter with right IJ line remaining in place. Mediastinal drain. Left chest tube. Left ventricular assist device unchanged in position. Midline trachea. Cardiomegaly accentuated by AP portable technique. Left pleural effusion is felt to be slightly increased. No pneumothorax. Worsening moderate interstitial edema. Increased left and persistent right base predominant airspace disease. IMPRESSION: Overall worsened aeration, with  progressive congestive failure, left pleural fluid, and left base airspace disease. Support apparatus as detailed above.  No pneumothorax. Electronically Signed   By: Abigail Miyamoto M.D.   On: 06/15/2017  07:47   Dg Chest Port 1 View  Result Date: 06/14/2017 CLINICAL DATA:  Insertion of implantable left ventricular assist device 2 days ago. EXAM: PORTABLE CHEST 1 VIEW COMPARISON:  Portable chest x-ray of June 13, 2017 FINDINGS: The trachea and esophagus have been extubated. The lungs are mildly hypoinflated. There is density at the left lung base little changed from the previous study. There is no large pleural effusion and no pneumothorax. The cardiac silhouette is enlarged. The Swan-Ganz catheter tip projects over the proximal right main pulmonary artery. The ICD is in stable position. The left ventricular assist device also appears stable. The mediastinal drain and the left chest tube are in stable position. The right internal jugular venous catheter tip projects over the cavoatrial junction. The sternal wires are intact. IMPRESSION: Fairly stable appearance of the chest since extubation. Persistent left lower lobe atelectasis or pneumonia. Small bilateral pleural effusions. No pneumothorax. Mild cardiomegaly. Decreased engorgement of the pulmonary vascularity. The support tubes and devices are in reasonable position. Electronically Signed   By: David  Martinique M.D.   On: 06/14/2017 08:11     Medications:     Scheduled Medications: . acetaminophen  1,000 mg Oral Q6H   Or  . acetaminophen (TYLENOL) oral liquid 160 mg/5 mL  1,000 mg Per Tube Q6H  . aspirin EC  325 mg Oral Daily   Or  . aspirin  324 mg Per Tube Daily   Or  . aspirin  300 mg Rectal Daily  . atorvastatin  80 mg Oral q1800  . bisacodyl  10 mg Oral Daily   Or  . bisacodyl  10 mg Rectal Daily  . chlorhexidine  15 mL Mouth Rinse BID  . Chlorhexidine Gluconate Cloth  6 each Topical Daily  . docusate sodium  200 mg Oral Daily  .  insulin aspart  0-24 Units Subcutaneous Q4H  . mouth rinse  15 mL Mouth Rinse q12n4p  . metoCLOPramide (REGLAN) injection  10 mg Intravenous Q6H  . metolazone  5 mg Oral Daily  . mupirocin ointment   Nasal BID  . pantoprazole  40 mg Oral Daily  . sodium chloride flush  10-40 mL Intracatheter Q12H  . sodium chloride flush  3 mL Intravenous Q12H  . warfarin  5 mg Oral q1800  . Warfarin - Physician Dosing Inpatient   Does not apply q1800    Infusions: . sodium chloride Stopped (06/14/17 1400)  . sodium chloride Stopped (06/13/17 1100)  . sodium chloride Stopped (06/13/17 1300)  . amiodarone 30 mg/hr (06/15/17 1400)  . DOPamine 3 mcg/kg/min (06/15/17 1500)  . EPINEPHrine 4 mg in dextrose 5% 250 mL infusion (16 mcg/mL) Stopped (06/15/17 0846)  . furosemide (LASIX) infusion 15 mg/hr (06/15/17 1500)  . heparin Stopped (06/15/17 1140)  . lactated ringers Stopped (06/14/17 1400)  . lactated ringers 10 mL/hr at 06/15/17 1500  . milrinone 0.375 mcg/kg/min (06/15/17 1500)  . nitroGLYCERIN Stopped (06/13/17 1700)  . dialysis replacement fluid (prismasate) Stopped (06/15/17 1405)  . dialysis replacement fluid (prismasate) Stopped (06/15/17 1405)  . dialysate (PRISMASATE) Stopped (06/15/17 1405)  . sodium chloride      PRN Medications: sodium chloride, heparin, midazolam, morphine injection, ondansetron (ZOFRAN) IV, oxyCODONE, promethazine, sodium chloride, sodium chloride flush, sodium chloride flush, traMADol   Assessment:  HeartMate 3 VAD implant for ischemic cardiomyopathy Preoperative ventricular ectopy with permanent pacemaker-AICD Preoperative moderate chronic kidney disease stage III Preoperative hemicolectomy for granulomatous colitis Poorly controlled diabetes mellitus with hemoglobin A1c 10.0, neuropathy Peripheral vascular  disease with ABIs 0.5 bilaterally  postoperative acute renal failure with rising creatinine 4.2 and oliguria- HD cath placed and CRT started Excellent VAD  performance postop  Plan/Discussion:    Continue current low-dose inotropes  low-dose dopamine and  Continue 0.375 milrinone for RV function  NSR on iv amiodarone   evidence of acute on chronic renal insufficiency with low urine output and fluid retention and poor response to Lasix - CRT started  Start low-dose IV heparin for previous history of DVT and factor V Leiden deficiency. Oral Coumadin dosing started.   Patient walked in hall 100 feet    I reviewed the LVAD parameters from today, and compared the results to the patient's prior recorded data.  No programming changes were made.  The LVAD is functioning within specified parameters.  The patient performs LVAD self-test daily.  LVAD interrogation was negative for any significant power changes, alarms or PI events/speed drops.  LVAD equipment check completed and is in good working order.  Back-up equipment present.   LVAD education done on emergency procedures and precautions and reviewed exit site care.  Length of Stay: [place] [facility] 06/15/2017, 3:56 PM

## 2017-06-15 NOTE — Consult Note (Signed)
Referring Provider: No ref. provider found Primary Care Physician:  [name], MD Primary Nephrologist:    Reason for Consultation:   Acute renal failure  Patient with marked volume overload  Cardiogenic shock   HPI:  Acute on chronic systolic heart failure with st Judes ICD  NSTEMI in March 2018 and DES to LAD and RCA  Complicated by cardiogenic shock and low output heart failure. He has required milrinone . The last Echo 8/18 showed EF of 20- 25 % and moderate MR. HeartMate 3 VAD placement 10/8. Baseline creatinine appears to be about variable but has been in the 2 range . It did improve with milrinone. Despite maximal diuretics he has failed to make urine and his creatinine continues to rise as does his volume status. He now is facing respiratory compromise with worsening edema on CXR. In addition  He has a history of diabetes and crohns disease with a hemicolectomy in 2003  He has Factor V Leiden deficiency and history of recurrent DVT  He is on coumadin and IV Heparin.       Past Medical History:  Diagnosis Date  . Angina   . ASCVD (arteriosclerotic cardiovascular disease)    , Anterior infarction 2005, LAD diagonal bifurcation intervention 03/2004  . Automatic implantable cardiac defibrillator -St. Jude's       . Benign neoplasm of colon   . CHF (congestive heart failure) ([place])   . Chronic systolic heart failure ([place])   . Coronary artery disease     Widely patent previously placed stents in the left anterior   . Crohn's disease ([place])   . 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 Dyspnea   . Gastroparesis   . GERD (gastroesophageal reflux disease)   . High cholesterol   . Hyperlipidemia   . Hypersomnolent    Previous diagnosis of narcolepsy  . Hypertension, essential   . Ischemic cardiomyopathy    Ejection fraction 15-20% catheterization 2010  . Type II or unspecified type diabetes mellitus without mention of complication, not stated as  uncontrolled   . Unspecified gastritis and gastroduodenitis without mention of hemorrhage     Past Surgical History:  Procedure Laterality Date  . APPENDECTOMY    . CARDIAC DEFIBRILLATOR PLACEMENT  2010   St. Jude ICD  . COLECTOMY     "for Crohn's"  . COLON SURGERY    . CORONARY STENT INTERVENTION N/A 11/25/2016   Procedure: Coronary Stent Intervention;  Surgeon: Leonie Man, MD;  Location: [facility];  Service: Cardiovascular;  Laterality: N/A;  . EP IMPLANTABLE DEVICE N/A 09/14/2016   Procedure: ICD Generator Changeout;  Surgeon: Deboraha Sprang, MD;  Location: [facility];  Service: Cardiovascular;  Laterality: N/A;  . FETAL SURGERY FOR CONGENITAL HERNIA    . INGUINAL HERNIA REPAIR    . INSERTION OF IMPLANTABLE LEFT VENTRICULAR ASSIST DEVICE N/A 06/12/2017   Procedure: INSERTION OF IMPLANTABLE LEFT VENTRICULAR ASSIST [place];  Surgeon: Ivin Poot, MD;  Location: [facility];  Service: Open Heart Surgery;  Laterality: N/A;  HM3 LVAD  CIRC ARREST  NITRIC OXIDE  . MULTIPLE EXTRACTIONS WITH ALVEOLOPLASTY N/A 06/08/2017   Procedure: MULTIPLE EXTRACTION WITH ALVEOLOPLASTY AND PRE PROSTHETIC SURGERY AS NEEDED;  Surgeon: Lenn Cal, DDS;  Location: [facility];  Service: Oral Surgery;  Laterality: N/A;  . RIGHT HEART CATH N/A 06/07/2017   Procedure: RIGHT HEART CATH;  Surgeon: Larey Dresser, MD;  Location: [facility] IN[facility]IVE [facility]  [facility];  Service: Cardiovascular;  Laterality: N/A;  . RIGHT/LEFT HEART CATH AND CORONARY ANGIOGRAPHY N/A 11/23/2016   Procedure: Right/Left Heart Cath and Coronary Angiography;  Surgeon: Larey Dresser, MD;  Location: [facility];  Service: Cardiovascular;  Laterality: N/A;  . TEE WITHOUT CARDIOVERSION N/A 06/12/2017   Procedure: TRANSESOPHAGEAL ECHOCARDIOGRAM (TEE);  Surgeon: Prescott Gum, Collier Salina, MD;  Location: [facility];  Service: Open Heart Surgery;  Laterality: N/A;    Prior to Admission medications   Medication Sig Start Date End Date  Taking? Authorizing Provider  atorvastatin (LIPITOR) 80 MG tablet Take 1 tablet (80 mg total) by mouth daily at 6 PM. 11/28/16  Yes Tillery, Satira Mccallum, PA-C  carvedilol (COREG) 6.25 MG tablet Take 0.5 tablets (3.125 mg total) by mouth 2 (two) times daily. 02/13/17  Yes Clegg, Amy D, NP  clopidogrel (PLAVIX) 75 MG tablet Take 1 tablet (75 mg total) by mouth daily. 11/29/16  Yes Shirley Friar, PA-C  digoxin (LANOXIN) 0.125 MG tablet Take 1 tablet (0.125 mg total) by mouth daily. 12/13/16  Yes Larey Dresser, MD  empagliflozin (JARDIANCE) 25 MG TABS tablet Take 25 mg by mouth daily. 05/11/17  Yes Hensel, Jamal Collin, MD  enoxaparin (LOVENOX) 100 MG/ML injection Inject 0.9 mLs (90 mg total) into the skin every 12 (twelve) hours. 05/24/17  Yes Larey Dresser, MD  gabapentin (NEURONTIN) 300 MG capsule Take 1 capsule (300 mg total) by mouth 2 (two) times daily. 05/10/17  Yes [name], MD  insulin aspart (NOVOLOG) 100 UNIT/ML injection Inject 7 Units into the skin daily with supper. 05/11/17  Yes Hensel, Jamal Collin, MD  insulin glargine (LANTUS) 100 UNIT/ML injection Inject 0.21 mLs (21 Units total) into the skin daily. Patient taking differently: Inject 21 Units into the skin every evening.  05/11/17  Yes Hensel, Jamal Collin, MD  isosorbide-hydrALAZINE (BIDIL) 20-37.5 MG tablet Take 1.5 tablets by mouth 3 (three) times daily. 04/25/17  Yes Larey Dresser, MD  magnesium oxide (MAG-OX) 400 MG tablet Take 1 tablet (400 mg total) by mouth daily. 05/05/17  Yes Larey Dresser, MD  metoCLOPramide (REGLAN) 5 MG tablet Take one tablet 1/2 hr before meals. Patient taking differently: Take by mouth 3 (three) times daily before meals.  01/10/17  Yes Esterwood, Amy S, PA-C  pantoprazole (PROTONIX) 40 MG tablet Take 40 mg by mouth daily.   Yes [provider]  spironolactone (ALDACTONE) 25 MG tablet Take 0.5 tablets (12.5 mg total) by mouth daily. 04/14/17  Yes Carlyle Dolly, MD   torsemide (DEMADEX) 20 MG tablet Take 80 mg (4 Tabs) Daily in the AM and 80 mg (4 Tabs) Every evening. 05/11/17  Yes Hensel, Jamal Collin, MD  warfarin (COUMADIN) 5 MG tablet TAKE AS DIRECTED BY COUMADIN CLINIC Patient taking differently: Take 5-7.5 mg by mouth See admin instructions. 64m on Sun/Mon/Wed/Fri and 7.515mon Tues/Thurs/Sat. TAKE AS DIRECTED BY COUMADIN CLINIC 04/20/17  Yes KlDeboraha SprangMD  nitroGLYCERIN (NITROSTAT) 0.4 MG SL tablet Place 1 tablet (0.4 mg total) under the tongue every 5 (five) minutes as needed for chest pain. 07/13/16   [place] PainMD  ondansetron (ZOFRAN ODT) 4 MG disintegrating tablet Take 1 tablet (4 mg total) by mouth every 6 (six) hours as needed for nausea or vomiting. 01/10/17   Esterwood, Amy S, PA-C    Current Facility-Administered Medications  Medication Dose Route Frequency Provider Last Rate Last Dose  . 0.45 % sodium chloride infusion   Intravenous  Continuous PRN Prescott Gum, Collier Salina, MD   Stopped at 06/14/17 1400  . 0.9 %  sodium chloride infusion  250 mL Intravenous Continuous Ivin Poot, MD   Stopped at 06/13/17 1100  . 0.9 %  sodium chloride infusion   Intravenous Continuous Ivin Poot, MD   Stopped at 06/13/17 1300  . acetaminophen (TYLENOL) tablet 1,000 mg  1,000 mg Oral Q6H Prescott Gum, Collier Salina, MD   1,000 mg at 06/13/17 1750   Or  . acetaminophen (TYLENOL) solution 1,000 mg  1,000 mg Per Tube Q6H Prescott Gum, Collier Salina, MD   1,000 mg at 06/13/17 0507  . amiodarone (NEXTERONE PREMIX) 360-4.14 MG/200ML-% (1.8 mg/mL) IV infusion  30 mg/hr Intravenous Continuous Prescott Gum, Collier Salina, MD 16.7 mL/hr at 06/15/17 1000 30 mg/hr at 06/15/17 1000  . aspirin EC tablet 325 mg  325 mg Oral Daily Prescott Gum, Collier Salina, MD   325 mg at 06/15/17 3299   Or  . aspirin chewable tablet 324 mg  324 mg Per Tube Daily Ivin Poot, MD       Or  . aspirin suppository 300 mg  300 mg Rectal Daily Prescott Gum, Collier Salina, MD      . atorvastatin (LIPITOR) tablet 80 mg  80 mg Oral q1800  Clegg, Amy D, NP      . bisacodyl (DULCOLAX) EC tablet 10 mg  10 mg Oral Daily Prescott Gum, Collier Salina, MD   10 mg at 06/15/17 2426   Or  . bisacodyl (DULCOLAX) suppository 10 mg  10 mg Rectal Daily Prescott Gum, Collier Salina, MD      . chlorhexidine (PERIDEX) 0.12 % solution 15 mL  15 mL Mouth Rinse BID Prescott Gum, Collier Salina, MD   15 mL at 06/15/17 0955  . Chlorhexidine Gluconate Cloth 2 % PADS 6 each  6 each Topical Daily Ivin Poot, MD   6 each at 06/14/17 [phone]  . docusate sodium (COLACE) capsule 200 mg  200 mg Oral Daily Prescott Gum, Collier Salina, MD   200 mg at 06/15/17 0950  . DOPamine (INTROPIN) 800 mg in dextrose 5 % 250 mL (3.2 mg/mL) infusion  3 mcg/kg/min Intravenous Titrated Larey Dresser, MD 5.6 mL/hr at 06/15/17 1000 3 mcg/kg/min at 06/15/17 1000  . EPINEPHrine (ADRENALIN) 4 mg in dextrose 5 % 250 mL (0.016 mg/mL) infusion  0-10 mcg/min Intravenous Titrated Prescott Gum, Collier Salina, MD   Stopped at 06/15/17 [phone]  . furosemide (LASIX) 250 mg in dextrose 5 % 250 mL (1 mg/mL) infusion  15 mg/hr Intravenous Continuous Shirley Friar, PA-C 15 mL/hr at 06/15/17 1000 15 mg/hr at 06/15/17 1000  . heparin ADULT infusion 100 units/mL (25000 units/274m sodium chloride 0.45%)  800 Units/hr Intravenous Continuous VPrescott Gum PCollier Salina MD 8 mL/hr at 06/15/17 1000 800 Units/hr at 06/15/17 1000  . insulin aspart (novoLOG) injection 0-24 Units  0-24 Units Subcutaneous Q4H VIvin Poot MD   2 Units at 06/14/17 1609  . lactated ringers infusion   Intravenous Continuous VIvin Poot MD   Stopped at 06/14/17 1400  . lactated ringers infusion   Intravenous Continuous VIvin Poot MD   Stopped at 06/15/17 [phone] . MEDLINE mouth rinse  15 mL Mouth Rinse q12n4p VPrescott Gum PCollier Salina MD   15 mL at 06/14/17 1600  . metoCLOPramide (REGLAN) injection 10 mg  10 mg Intravenous Q6H VIvin Poot MD   10 mg at 06/15/17 0502  . metolazone (ZAROXOLYN) tablet 5 mg  5 mg Oral Daily VPrescott Gum  Collier Salina, MD   5 mg at 06/15/17 0951  .  midazolam (VERSED) injection 2 mg  2 mg Intravenous Q1H PRN Ivin Poot, MD   2 mg at 06/13/17 0042  . milrinone (PRIMACOR) 20 MG/100 ML (0.2 mg/mL) infusion  0.375 mcg/kg/min Intravenous Continuous Larey Dresser, MD 11.1 mL/hr at 06/15/17 1000 0.375 mcg/kg/min at 06/15/17 1000  . morphine 4 MG/ML injection 2-5 mg  2-5 mg Intravenous Q1H PRN Ivin Poot, MD   4 mg at 06/14/17 0321  . mupirocin ointment (BACTROBAN) 2 %   Nasal BID Prescott Gum, Collier Salina, MD      . nitroGLYCERIN 50 mg in dextrose 5 % 250 mL (0.2 mg/mL) infusion  7-100 mcg/min Intravenous Titrated Ivin Poot, MD   Stopped at 06/13/17 1700  . ondansetron (ZOFRAN) injection 4 mg  4 mg Intravenous Q6H PRN Ivin Poot, MD   4 mg at 06/15/17 0618  . oxyCODONE (Oxy IR/ROXICODONE) immediate release tablet 5-10 mg  5-10 mg Oral Q3H PRN Ivin Poot, MD   10 mg at 06/14/17 9924  . pantoprazole (PROTONIX) EC tablet 40 mg  40 mg Oral Daily Prescott Gum, Collier Salina, MD   40 mg at 06/15/17 0951  . piperacillin-tazobactam (ZOSYN) IVPB 3.375 g  3.375 g Intravenous Q8H Einar Grad, RPH 12.5 mL/hr at 06/15/17 0824 3.375 g at 06/15/17 0824  . promethazine (PHENERGAN) injection 12.5 mg  12.5 mg Intravenous Q4H PRN Ivin Poot, MD   12.5 mg at 06/15/17 0841  . sodium chloride flush (NS) 0.9 % injection 10-40 mL  10-40 mL Intracatheter Q12H Prescott Gum, Collier Salina, MD   10 mL at 06/14/17 0915  . sodium chloride flush (NS) 0.9 % injection 10-40 mL  10-40 mL Intracatheter PRN Prescott Gum, Collier Salina, MD      . sodium chloride flush (NS) 0.9 % injection 3 mL  3 mL Intravenous Q12H Prescott Gum, Collier Salina, MD      . sodium chloride flush (NS) 0.9 % injection 3 mL  3 mL Intravenous PRN Prescott Gum, Collier Salina, MD      . traMADol Veatrice Bourbon) tablet 50-100 mg  50-100 mg Oral Q4H PRN Prescott Gum, Collier Salina, MD      . warfarin (COUMADIN) tablet 5 mg  5 mg Oral q1800 Ivin Poot, MD      . Warfarin - Physician Dosing Inpatient   Does not apply [id number] Larey Dresser, MD         Allergies as of 05/23/2017 - Review Complete 05/19/2017  Allergen Reaction Noted  . Metformin and related Diarrhea 06/03/2013    Family History  Problem Relation Age of Onset  . Colon polyps Mother   . Diabetes Mother   . Heart attack Father   . Diabetes Father   . Stroke Paternal Grandfather   . Colitis Sister   . Heart disease Brother   . Colitis Sister   . Colon cancer Neg Hx     Social History   Social History  . Marital status: Married    Spouse name: N/A  . Number of children: N/A  . Years of education: N/A   Occupational History  . Not on file.   Social History Main Topics  . Smoking status: Never Smoker  . Smokeless tobacco: Never Used  . Alcohol use No  . Drug use: No  . Sexual activity: No   Other Topics Concern  . Not on file   Social History Narrative  . No narrative  on file    Review of Systems: Not obtainable due to critical nature of illness  Physical Exam: Vital signs in last 24 hours: Temp:  [97.6 F (36.4 C)-98.6 F (37 C)] 97.9 F (36.6 C) (10/11 0824) Pulse Rate:  [97-112] 109 (10/11 1000) Resp:  [12-32] 25 (10/11 1000) BP: (98-139)/(72-112) 99/72 (10/11 1000) SpO2:  [90 %-100 %] 94 % (10/11 1000) Weight:  [220 lb 14.4 oz (100.2 kg)] 220 lb 14.4 oz (100.2 kg) (10/11 0630) Last BM Date: 06/11/17 General:   Alert,   Interactive although somnolent Head:  Normocephalic and atraumatic. Eyes:  Sclera clear, no icterus.   Conjunctiva pink. Ears:  Normal auditory acuity. Nose:  No deformity, discharge,  or lesions. Mouth:  No deformity or lesions, dentition normal. Neck:  Supple; no masses or thyromegaly. JVP not elevated Lungs:  Diminished breath sounds throughout  Heart:  Regular rate and rhythm; no murmurs, clicks, rubs,  or gallops. Abdomen:  Soft, nontender and nondistended. No masses, hepatosplenomegaly or hernias noted. Normal bowel sounds, without guarding, and without rebound.   Msk:  Symmetrical without gross  deformities. Normal posture. Pulses:  No carotid, renal, femoral bruits. DP and PT symmetrical and equal Extremities:  Without clubbing or edema. Neurologic:  Alert and  oriented x4;  grossly normal neurologically. Skin:  Intact without significant lesions or rashes.   Intake/Output from previous day: 10/10 0701 - 10/11 0700 In: 2405.1 [I.V.:2005.1; IV Piggyback:400] Out: 1010 [Urine:300; Emesis/NG output:500; Chest Tube:210] Intake/Output this shift: Total I/O In: 234.9 [I.V.:184.9; IV Piggyback:50] Out: 70 [Urine:20; Chest Tube:50]  Lab Results:  Recent Labs  06/13/17 1700 06/14/17 0310 06/14/17 1539 06/15/17 0312  WBC 24.4* 25.7*  --  24.5*  HGB 9.6* 8.8* 10.2* 8.8*  HCT 28.8* 27.0* 30.0* 25.9*  PLT 133* 125*  --  158   BMET  Recent Labs  06/13/17 0320  06/14/17 0310 06/14/17 1539 06/15/17 0312  NA 134*  < > 130* 131* 130*  K 3.9  < > 4.9 4.5 4.4  CL 103  < > 97* 95* 95*  CO2 22  --  21*  --  23  GLUCOSE 128*  < > 220* 148* 101*  BUN 19  < > 25* 29* 32*  CREATININE 1.62*  < > 2.75* 3.60* 4.04*  CALCIUM 8.3*  --  8.4*  --  8.3*  PHOS 3.1  --  5.9*  --  6.6*  < > = values in this interval not displayed. LFT  Recent Labs  06/15/17 0312  PROT 6.5  ALBUMIN 2.9*  AST 52*  ALT 22  ALKPHOS 99  BILITOT 3.2*   PT/INR  Recent Labs  06/14/17 0310 06/15/17 0312  LABPROT 17.4* 17.7*  INR 1.44 1.47   Hepatitis Panel No results for input(s): HEPBSAG, HCVAB, HEPAIGM, HEPBIGM in the last 72 hours.  Studies/Results: Dg Chest Port 1 View  Result Date: 06/15/2017 CLINICAL DATA:  Left ventricular assist device.  Chest soreness. EXAM: PORTABLE CHEST 1 VIEW COMPARISON:  1 day prior FINDINGS: Pacer/AICD device with leads at right atrium and right ventricle. Interval removal of Swan-Ganz catheter with right IJ line remaining in place. Mediastinal drain. Left chest tube. Left ventricular assist device unchanged in position. Midline trachea. Cardiomegaly accentuated  by AP portable technique. Left pleural effusion is felt to be slightly increased. No pneumothorax. Worsening moderate interstitial edema. Increased left and persistent right base predominant airspace disease. IMPRESSION: Overall worsened aeration, with progressive congestive failure, left pleural fluid, and left base airspace  disease. Support apparatus as detailed above.  No pneumothorax. Electronically Signed   By: Abigail Miyamoto M.D.   On: 06/15/2017 07:47   Dg Chest Port 1 View  Result Date: 06/14/2017 CLINICAL DATA:  Insertion of implantable left ventricular assist device 2 days ago. EXAM: PORTABLE CHEST 1 VIEW COMPARISON:  Portable chest x-ray of June 13, 2017 FINDINGS: The trachea and esophagus have been extubated. The lungs are mildly hypoinflated. There is density at the left lung base little changed from the previous study. There is no large pleural effusion and no pneumothorax. The cardiac silhouette is enlarged. The Swan-Ganz catheter tip projects over the proximal right main pulmonary artery. The ICD is in stable position. The left ventricular assist device also appears stable. The mediastinal drain and the left chest tube are in stable position. The right internal jugular venous catheter tip projects over the cavoatrial junction. The sternal wires are intact. IMPRESSION: Fairly stable appearance of the chest since extubation. Persistent left lower lobe atelectasis or pneumonia. Small bilateral pleural effusions. No pneumothorax. Mild cardiomegaly. Decreased engorgement of the pulmonary vascularity. The support tubes and devices are in reasonable position. Electronically Signed   By: David  Martinique M.D.   On: 06/14/2017 08:11    Assessment/Plan:  Acute on chronic renal disease with some acute renal injury - this may be secondary to ATN from an ischemic insult although the patient was receiving vancomycin and wonder it there has been also some toxic insult from vancomycin. Will check level and  also patient was receiving high dose statin and will check CPK  We shall also check a renal ultrasound to ensure there is no ureteral obstruction.   Volume  This appears to be the most difficult to control at this time and there is concern that worsening respiratory compromise will prevent him from recovery. I see the only option at this time is CRRT and will have a temporary catheter placed so that this can be started  Acute on chronic heart failure  Requiring both pressors and LVAD  Placed 10/8  Anemia will follow check iron stores and consider starting ESA  Bones stable at this point and will continue to follow metabolic parameters  Access will need dialysis catheter placed  This could be placed by critical care services    LOS: 8 Keyondra Lagrand W @TODAY @10 :33 AM

## 2017-06-15 NOTE — Brief Op Note (Signed)
06/07/2017 - 06/12/2017  1:07 PM  PATIENT:  [name]  51 y.o. male  PRE-OPERATIVE DIAGNOSIS:  HEART FAILURE renal failure  POST-OPERATIVE DIAGNOSIS:  HEART FAILURE renal failure  PROCEDURE:   Placement Trialysis catheter R subclavian v  SURGEON:  Surgeon(s) and Role:    Ivin Poot, MD - Primary     PHYSICIAN ASSISTANT:   ASSISTANTS: none   ANESTHESIA:   local  EBL:  Total I/O In: 377.4 [I.V.:327.4; IV Piggyback:50] Out: 130 [Urine:80; Chest Tube:50]  BLOOD ADMINISTERED:none  DRAINS: none   LOCAL MEDICATIONS USED:  LIDOCAINE  and Amount: 8 ml  SPECIMEN:  No Specimen  DISPOSITION OF SPECIMEN:  N/A  COUNTS:  no  TOURNIQUET:  * No tourniquets in log *  DICTATION: .Dragon Dictation  PLAN OF CARE: 1 hour  PATIENT DISPOSITION:  remain 2H 10   Delay start of Pharmacological VTE agent (>24hrs) due to surgical blood loss or risk of bleeding: yes

## 2017-06-15 NOTE — Plan of Care (Signed)
Problem: Activity: Goal: Risk for activity intolerance will decrease Outcome: Progressing Patient up in chair again today. Physical therapy will be working with patient and attempting to walk today.  Problem: Respiratory: Goal: Ability to maintain adequate ventilation will improve Outcome: Progressing Patient able to do pulmonary exercises- cough and deep breathing and incentive spirometer.

## 2017-06-15 NOTE — Progress Notes (Signed)
Dr. Prescott Gum notified of co-ox results. Would like it re-drawn when patient in NSR. Will notify oncoming RN of needed lab.

## 2017-06-16 ENCOUNTER — Inpatient Hospital Stay (HOSPITAL_COMMUNITY): Payer: BLUE CROSS/BLUE SHIELD

## 2017-06-16 ENCOUNTER — Encounter (HOSPITAL_COMMUNITY): Payer: BLUE CROSS/BLUE SHIELD

## 2017-06-16 LAB — POCT I-STAT, CHEM 8
BUN: 23 mg/dL — AB (ref 6–20)
CHLORIDE: 100 mmol/L — AB (ref 101–111)
CREATININE: 2.8 mg/dL — AB (ref 0.61–1.24)
Calcium, Ion: 1 mmol/L — ABNORMAL LOW (ref 1.15–1.40)
GLUCOSE: 147 mg/dL — AB (ref 65–99)
HCT: 29 % — ABNORMAL LOW (ref 39.0–52.0)
HEMOGLOBIN: 9.9 g/dL — AB (ref 13.0–17.0)
POTASSIUM: 4.2 mmol/L (ref 3.5–5.1)
Sodium: 136 mmol/L (ref 135–145)
TCO2: 24 mmol/L (ref 22–32)

## 2017-06-16 LAB — RENAL FUNCTION PANEL
ALBUMIN: 2.7 g/dL — AB (ref 3.5–5.0)
Anion gap: 12 (ref 5–15)
BUN: 23 mg/dL — AB (ref 6–20)
CALCIUM: 8 mg/dL — AB (ref 8.9–10.3)
CO2: 24 mmol/L (ref 22–32)
Chloride: 99 mmol/L — ABNORMAL LOW (ref 101–111)
Creatinine, Ser: 3.03 mg/dL — ABNORMAL HIGH (ref 0.61–1.24)
GFR calc Af Amer: 26 mL/min — ABNORMAL LOW (ref >60)
GFR calc non Af Amer: 22 mL/min — ABNORMAL LOW (ref >60)
GLUCOSE: 149 mg/dL — AB (ref 65–99)
PHOSPHORUS: 3.3 mg/dL (ref 2.5–4.6)
Potassium: 4.3 mmol/L (ref 3.5–5.1)
SODIUM: 135 mmol/L (ref 135–145)

## 2017-06-16 LAB — CBC
HCT: 24.7 % — ABNORMAL LOW (ref 39.0–52.0)
Hemoglobin: 8.5 g/dL — ABNORMAL LOW (ref 13.0–17.0)
MCH: 26.2 pg (ref 26.0–34.0)
MCHC: 34.4 g/dL (ref 30.0–36.0)
MCV: 76.2 fL — ABNORMAL LOW (ref 78.0–100.0)
Platelets: 164 10*3/uL (ref 150–400)
RBC: 3.24 MIL/uL — ABNORMAL LOW (ref 4.22–5.81)
RDW: 16.1 % — ABNORMAL HIGH (ref 11.5–15.5)
WBC: 16.7 10*3/uL — ABNORMAL HIGH (ref 4.0–10.5)

## 2017-06-16 LAB — GLUCOSE, CAPILLARY
GLUCOSE-CAPILLARY: 131 mg/dL — AB (ref 65–99)
Glucose-Capillary: 119 mg/dL — ABNORMAL HIGH (ref 65–99)
Glucose-Capillary: 151 mg/dL — ABNORMAL HIGH (ref 65–99)
Glucose-Capillary: 155 mg/dL — ABNORMAL HIGH (ref 65–99)
Glucose-Capillary: 204 mg/dL — ABNORMAL HIGH (ref 65–99)

## 2017-06-16 LAB — CBC WITH DIFFERENTIAL/PLATELET
Basophils Absolute: 0 10*3/uL (ref 0.0–0.1)
Basophils Relative: 0 %
Eosinophils Absolute: 0.1 10*3/uL (ref 0.0–0.7)
Eosinophils Relative: 0 %
HCT: 22.7 % — ABNORMAL LOW (ref 39.0–52.0)
Hemoglobin: 8.1 g/dL — ABNORMAL LOW (ref 13.0–17.0)
Lymphocytes Relative: 3 %
Lymphs Abs: 0.6 10*3/uL — ABNORMAL LOW (ref 0.7–4.0)
MCH: 26.7 pg (ref 26.0–34.0)
MCHC: 35.7 g/dL (ref 30.0–36.0)
MCV: 74.9 fL — ABNORMAL LOW (ref 78.0–100.0)
Monocytes Absolute: 2.2 10*3/uL — ABNORMAL HIGH (ref 0.1–1.0)
Monocytes Relative: 11 %
Neutro Abs: 17.3 10*3/uL — ABNORMAL HIGH (ref 1.7–7.7)
Neutrophils Relative %: 86 %
Platelets: 144 10*3/uL — ABNORMAL LOW (ref 150–400)
RBC: 3.03 MIL/uL — ABNORMAL LOW (ref 4.22–5.81)
RDW: 15.5 % (ref 11.5–15.5)
WBC: 20.1 10*3/uL — ABNORMAL HIGH (ref 4.0–10.5)

## 2017-06-16 LAB — MAGNESIUM: Magnesium: 2.4 mg/dL (ref 1.7–2.4)

## 2017-06-16 LAB — COMPREHENSIVE METABOLIC PANEL
ALK PHOS: 88 U/L (ref 38–126)
ALT: 19 U/L (ref 17–63)
ANION GAP: 14 (ref 5–15)
AST: 36 U/L (ref 15–41)
Albumin: 2.7 g/dL — ABNORMAL LOW (ref 3.5–5.0)
BUN: 29 mg/dL — ABNORMAL HIGH (ref 6–20)
CALCIUM: 8.1 mg/dL — AB (ref 8.9–10.3)
CHLORIDE: 96 mmol/L — AB (ref 101–111)
CO2: 22 mmol/L (ref 22–32)
CREATININE: 3.71 mg/dL — AB (ref 0.61–1.24)
GFR, EST AFRICAN AMERICAN: 20 mL/min — AB (ref >60)
GFR, EST NON AFRICAN AMERICAN: 17 mL/min — AB (ref >60)
Glucose, Bld: 96 mg/dL (ref 65–99)
Potassium: 4.2 mmol/L (ref 3.5–5.1)
SODIUM: 132 mmol/L — AB (ref 135–145)
Total Bilirubin: 3 mg/dL — ABNORMAL HIGH (ref 0.3–1.2)
Total Protein: 6.3 g/dL — ABNORMAL LOW (ref 6.5–8.1)

## 2017-06-16 LAB — PREPARE RBC (CROSSMATCH)

## 2017-06-16 LAB — COOXEMETRY PANEL
Carboxyhemoglobin: 0.8 % (ref 0.5–1.5)
Carboxyhemoglobin: 0.8 % (ref 0.5–1.5)
Methemoglobin: 1.5 % (ref 0.0–1.5)
Methemoglobin: 1.5 % (ref 0.0–1.5)
O2 Saturation: 62.5 %
O2 Saturation: 59.6 %
Total hemoglobin: 9 g/dL — ABNORMAL LOW (ref 12.0–16.0)
Total hemoglobin: 8.8 g/dL — ABNORMAL LOW (ref 12.0–16.0)

## 2017-06-16 LAB — APTT
aPTT: 54 seconds — ABNORMAL HIGH (ref 24–36)
aPTT: 90 seconds — ABNORMAL HIGH (ref 24–36)

## 2017-06-16 LAB — PHOSPHORUS: Phosphorus: 4.6 mg/dL (ref 2.5–4.6)

## 2017-06-16 LAB — HEPARIN LEVEL (UNFRACTIONATED)

## 2017-06-16 LAB — PROTIME-INR
INR: 1.93
INR: 2.59
PROTHROMBIN TIME: 21.9 s — AB (ref 11.4–15.2)
Prothrombin Time: 27.5 seconds — ABNORMAL HIGH (ref 11.4–15.2)

## 2017-06-16 LAB — LACTATE DEHYDROGENASE: LDH: 366 U/L — ABNORMAL HIGH (ref 98–192)

## 2017-06-16 MED ORDER — ENSURE ENLIVE PO LIQD
237.0000 mL | Freq: Three times a day (TID) | ORAL | Status: DC
  Administered 2017-06-16 – 2017-06-21 (×12): 237 mL via ORAL

## 2017-06-16 MED ORDER — WARFARIN SODIUM 2 MG PO TABS
2.0000 mg | ORAL_TABLET | Freq: Every day | ORAL | Status: DC
  Administered 2017-06-16: 2 mg via ORAL
  Filled 2017-06-16: qty 1

## 2017-06-16 MED ORDER — AMIODARONE HCL IN DEXTROSE 360-4.14 MG/200ML-% IV SOLN: 30.0000 mg/h | INTRAVENOUS | Status: DC

## 2017-06-16 MED ORDER — AMIODARONE LOAD VIA INFUSION
150.0000 mg | Freq: Once | INTRAVENOUS | Status: AC
  Administered 2017-06-16: 150 mg via INTRAVENOUS
  Filled 2017-06-16: qty 83.34

## 2017-06-16 MED ORDER — SODIUM CHLORIDE 0.9 % IV SOLN
Freq: Once | INTRAVENOUS | Status: AC
  Administered 2017-06-16: 04:00:00 via INTRAVENOUS

## 2017-06-16 MED ORDER — HEPARIN (PORCINE) IN NACL 100-0.45 UNIT/ML-% IJ SOLN
800.0000 [IU]/h | INTRAMUSCULAR | Status: DC
Start: 2017-06-16 — End: 2017-06-16

## 2017-06-16 MED ORDER — AMIODARONE HCL IN DEXTROSE 360-4.14 MG/200ML-% IV SOLN
60.0000 mg/h | INTRAVENOUS | Status: DC
  Administered 2017-06-16 (×3): 60 mg/h via INTRAVENOUS
  Filled 2017-06-16: qty 200

## 2017-06-16 NOTE — Progress Notes (Signed)
  Amiodarone Drug - Drug Interaction Consult Note  Recommendations: 30-50% Empiric dose reduction of warfarin   Amiodarone is metabolized by the cytochrome P450 system and therefore has the potential to cause many drug interactions. Amiodarone has an average plasma half-life of 50 days (range 20 to 100 days).   There is potential for drug interactions to occur several weeks or months after stopping treatment and the onset of drug interactions may be slow after initiating amiodarone.   []  Statins: Increased risk of myopathy. Simvastatin- restrict dose to 53m daily. Other statins: counsel patients to report any muscle pain or weakness immediately.  [x]  Anticoagulants: Amiodarone can increase anticoagulant effect. Consider warfarin dose reduction. Patients should be monitored closely and the dose of anticoagulant altered accordingly, remembering that amiodarone levels take several weeks to stabilize.  []  Antiepileptics: Amiodarone can increase plasma concentration of phenytoin, the dose should be reduced. Note that small changes in phenytoin dose can result in large changes in levels. Monitor patient and counsel on signs of toxicity.  []  Beta blockers: increased risk of bradycardia, AV block and myocardial depression. Sotalol - avoid concomitant use.  []   Calcium channel blockers (diltiazem and verapamil): increased risk of bradycardia, AV block and myocardial depression.  []   Cyclosporine: Amiodarone increases levels of cyclosporine. Reduced dose of cyclosporine is recommended.  []  Digoxin dose should be halved when amiodarone is started.  []  Diuretics: increased risk of cardiotoxicity if hypokalemia occurs.  []  Oral hypoglycemic agents (glyburide, glipizide, glimepiride): increased risk of hypoglycemia. Patient's glucose levels should be monitored closely when initiating amiodarone therapy.   [x]  Drugs that prolong the QT interval:  Torsades de pointes risk may be increased with  concurrent use - avoid if possible.  Monitor QTc, also keep magnesium/potassium WNL if concurrent therapy can't be avoided. .Marland KitchenAntibiotics: e.g. fluoroquinolones, erythromycin. . Antiarrhythmics: e.g. quinidine, procainamide, disopyramide, sotalol. . Antipsychotics: e.g. phenothiazines, haloperidol.  . Lithium, tricyclic antidepressants, and methadone. Thank You,  LNarda Bonds 06/16/2017 5:28 AM

## 2017-06-16 NOTE — Progress Notes (Signed)
Physical Therapy Treatment Patient Details Name: [name] MRN: [id number] DOB: Jun 02, 1966 Today's Date: 06/16/2017    History of Present Illness 51 yo with history of CAD s/p anterior MI in 2010 and chronic systolic CHF (ischemic cardiomyopathy) as well as CKD stage III now s/p HeartMate 3 VAD placement. CRRT initiated 06/16/17.  PMH includes:  ASCVD,  CHF, Chron's disease, DVT, gastroparesis; HTN, ischemic cardiomyopathy DM; s/p cardiac debriblator placement.     PT Comments    Pt admitted with above diagnosis. Pt currently with functional limitations due to the deficits listed below (see PT Problem List). Pt is now on CRRT therefore limited to transfers and exercise only.  Pt only tolerated transfer to chair today.  Pt had lunch therefore set up tray and encouraged pt to eat.  Will continue to follow.   Pt will benefit from skilled PT to increase their independence and safety with mobility to allow discharge to the venue listed below.     Follow Up Recommendations  Home health PT;Supervision/Assistance - 24 hour     Equipment Recommendations  Other (comment) (rollator)    Recommendations for Other Services       Precautions / Restrictions Precautions Precautions: Fall;Sternal Precaution Comments: LVAD, chest tube, Nitric oxide, CRRT Restrictions Weight Bearing Restrictions: Yes (sternal precautions)    Mobility  Bed Mobility Overal bed mobility: Needs Assistance Bed Mobility: Supine to Sit     Supine to sit: +2 for physical assistance;Max assist;HOB elevated (3rd person (nurse) to watch all lines)     General bed mobility comments: Assist for elevation of trunk and for LES.  Pt needs incr time as he gets nauseated.    Transfers Overall transfer level: Needs assistance Equipment used: 2 person hand held assist Transfers: Sit to/from Stand Sit to Stand: Mod assist;+2 physical assistance Stand pivot transfers: Min assist;+2 safety/equipment       General  transfer comment: Pt able to stand up holding onto pillow or arms across chest using LEs with cues. Able to take pivotal steps to chair with slight UE support only on PT arms.  Very safe and controlled descent into chair.  Nurse present to watch lines.   Ambulation/Gait                 Stairs            Wheelchair Mobility    Modified Rankin (Stroke Patients Only)       Balance Overall balance assessment: Needs assistance Sitting-balance support: No upper extremity supported;Feet supported Sitting balance-[name]: Poor Sitting balance - Comments: Needed min guard to min assist to sit EOB.  Pt with flexed head and neck.     Standing balance support: Bilateral upper extremity supported;During functional activity Standing balance-[name]: Poor Standing balance comment: Requires Bil UE support to stand but light support only                             Cognition Arousal/Alertness: Awake/alert Behavior During Therapy: Flat affect Overall Cognitive Status: Impaired/Different from baseline Area of Impairment: Problem solving;Safety/judgement;Following commands                       Following Commands: Follows one step commands with increased time Safety/Judgement: Decreased awareness of safety   Problem Solving: Slow processing;Decreased initiation;Difficulty sequencing;Requires verbal cues;Requires tactile cues        Exercises      General Comments  Pertinent Vitals/Pain Pain Assessment: Faces Faces Pain Scale: Hurts even more Pain Location: chest  Pain Descriptors / Indicators: Grimacing;Guarding Pain Intervention(s): Limited activity within patient's tolerance;Monitored during session;Repositioned;Premedicated before session   VSS on 4LO2 with nitric.  4.4 flow, 5400 speed, PI 3.2 down to 3.0 with activity.  Home Living                      Prior Function            PT Goals (current goals can now be found  in the care plan section) Progress towards PT goals: Progressing toward goals    Frequency    Min 3X/week      PT Plan Current plan remains appropriate    Co-evaluation              AM-PAC PT "6 Clicks" Daily Activity  Outcome Measure  Difficulty turning over in bed (including adjusting bedclothes, sheets and blankets)?: Unable Difficulty moving from lying on back to sitting on the side of the bed? : Unable Difficulty sitting down on and standing up from a chair with arms (e.g., wheelchair, bedside commode, etc,.)?: A Lot Help needed moving to and from a bed to chair (including a wheelchair)?: A Lot Help needed walking in hospital room?: Total Help needed climbing 3-5 steps with a railing? : Total 6 Click Score: 8    End of Session Equipment Utilized During Treatment: Oxygen (and nitric oxide) Activity Tolerance: Patient limited by fatigue;Patient limited by pain Patient left: with call bell/phone within reach;in chair;with family/visitor present Nurse Communication: Mobility status PT Visit Diagnosis: Unsteadiness on feet (R26.81);Muscle weakness (generalized) (M62.81)     Time: 6629-4765 PT Time Calculation (min) (ACUTE ONLY): 17 min  Charges:  $Therapeutic Activity: 8-22 mins                    G Codes:       Zaley Talley,PT Acute Rehabilitation [phone] (pager)    Denice Paradise 06/16/2017, 3:03 PM

## 2017-06-16 NOTE — Progress Notes (Signed)
EKG CRITICAL VALUE     12 lead EKG performed.  Critical value noted. Jaynie Bream, RN notified.   Brodin Gelpi H, CCT 06/16/2017 7:03 AM

## 2017-06-16 NOTE — Progress Notes (Signed)
ANTICOAGULATION CONSULT NOTE - Initial Consult  Pharmacy Consult for heparin Indication: post-VAD    Patient has been receiving heparin post-vad until INR therapeutic. Dosing was being done by CVTS. INR checked this evening was therapeutic at 2.59. No further heparin is needed, will stop now.  CBC, LDH, INR planned with am labs.   Erin Hearing PharmD., BCPS Clinical Pharmacist Pager [phone] 06/16/2017 5:02 PM

## 2017-06-16 NOTE — Progress Notes (Signed)
Performed  drive line exit wound care with [name] (daughter) present to observe. Existing VAD dressing removed and site care performed using sterile technique. Drive line exit site cleaned with Chlora prep applicators x 2, allowed to dry, and gauze dressing with silver strip re-applied. Exit site clean and dry, the velour is fully implanted at exit site. No redness, tenderness, drainage, foul odor or rash noted. Drive line anchor re-applied.    Patients daughter and son will be primary caregivers upon discharge. Please continue to involve them with any education regarding equipment or dressing changes. VAD Coordinators will follow up on Monday.   Balinda Quails RN, VAD Coordinator 24/7 pager [phone]

## 2017-06-16 NOTE — Op Note (Signed)
NAME:  COVE, HAYDON                 ACCOUNT NO.:  MEDICAL RECORD NO.:  [id number]  LOCATION:                                 FACILITY:  PHYSICIAN:  Ivin Poot, M.D.  DATE OF BIRTH:  03-02-1966  DATE OF PROCEDURE:  06/15/2017 DATE OF DISCHARGE:                              OPERATIVE REPORT   OPERATION:  Placement of right internal jugular Trialysis catheter for postoperative acute renal failure.  SURGEON:  Ivin Poot, MD.  ANESTHESIA:  Local 1% lidocaine.  PREOPERATIVE DIAGNOSIS:  Status post HeartMate 3 implantation for left ventricular assist of cardiomyopathy with acute on chronic renal insufficiency.  POSTOPERATIVE DIAGNOSIS:  Status post HeartMate 3 implantation for left ventricular assist of cardiomyopathy with acute on chronic renal insufficiency.  DESCRIPTION OF PROCEDURE:  After informed consent had been obtained, a proper time-out performed.  The patient's right chest and neck were prepped and draped as a sterile field.  Local 1% lidocaine was infiltrated beneath the right clavicle.  Local 1% lidocaine was infiltrated around the previously placed Cordis sheath, which had been totally prepped with CHG.  First, an attempted Trialysis catheter in the right subclavian vein was performed.  However, on followup x-ray, the catheter was entrapped by the intravascular AICD wires and would not pull volume for the hemodialysis machine.  After a separate prep and drape setup was performed, this catheter was removed over a wire, and over the wire, triple-lumen catheter was placed.  Next, over the previously placed Cordis sheath, a wire was introduced into the right atrium and the sheath was removed.  Over this wire, the Trialysis catheter was passed without difficulty in a straight route into the right atrium.  This was flushed and secured to the skin with silk sutures, and sterile dressings were applied on all catheters.  A chest x-ray is  pending.     Ivin Poot, M.D.     PV/MEDQ  D:  06/15/2017  T:  06/15/2017  Job:  [id number]

## 2017-06-16 NOTE — Progress Notes (Signed)
Patient ID: [name], male   DOB: 1966-03-08, 51 y.o.   MRN: [id number]   Advanced Heart Failure VAD Team Note  Subjective:    Admitted 10/3 for RHC/Swan implantation and optimization prior to LVAD.  HeartMate 3 VAD implanted 10/8.   Started CRRT on 10/11 but stopped overnight ?because he went into atrial fibrillation.   Overnight went in A fib RVR. Amio gtt increased to 60 mg/hr. Speed cut back to 5400.   Dyspnea this morning. CVP 21.  Co-ox adequate at 63%.     LVAD INTERROGATION:  HeartMate 3:  Flow 4.7liters/min, speed 5400 rpm, power 3.9 , PI 3.3  < 12 PI events over night.   Objective:    Vital Signs:   Temp:  [97.3 F (36.3 C)-98.3 F (36.8 C)] 97.5 F (36.4 C) (10/12 0415) Pulse Rate:  [29-138] 106 (10/12 0700) Resp:  [0-29] 15 (10/12 0700) BP: (71-144)/(44-134) 144/134 (10/12 0700) SpO2:  [78 %-98 %] 91 % (10/12 0700) Weight:  [226 lb 10.1 oz (102.8 kg)] 226 lb 10.1 oz (102.8 kg) (10/12 0615) Last BM Date: 06/11/17 Mean arterial Pressure 70s-80   Intake/Output:   Intake/Output Summary (Last 24 hours) at 06/16/17 0801 Last data filed at 06/16/17 0700  Gross per 24 hour  Intake          1907.08 ml  Output             1569 ml  Net           338.08 ml      Physical Exam: CVP 21 GENERAL: NAD. Warming blanket.  HEENT: normal  NECK: Supple, JVP to jaw .  2+ bilaterally, no bruits.  No lymphadenopathy or thyromegaly appreciated.   CARDIAC:  Irregular Mechanical heart sounds with LVAD hum present. RIJ HD cath. R subclavian central line.  LUNGS:  Clear to auscultation bilaterally.  ABDOMEN:  Soft, round, nontender, positive bowel sounds x4.     LVAD exit site:   Dressing dry and intact.  No erythema or drainage.  EXTREMITIES:  Warm and dry, no cyanosis, clubbing, rash or edema . R and LLE SCDs.  NEUROLOGIC:  Alert and oriented x 4.  Gait steady.  No aphasia.  No dysarthria.  Affect flat.     Telemetry   A Fib 120s   Labs   Basic Metabolic  Panel:  Recent Labs Lab 06/13/17 0320  06/13/17 1700 06/14/17 0310 06/14/17 1539 06/15/17 0312 06/15/17 1610 06/15/17 1614 06/16/17 0200  NA 134*  < >  --  130* 131* 130* 129* 129* 132*  K 3.9  < >  --  4.9 4.5 4.4 4.6 4.6 4.2  CL 103  < >  --  97* 95* 95* 93* 93* 96*  CO2 22  --   --  21*  --  23 21*  --  22  GLUCOSE 128*  < >  --  220* 148* 101* 108* 108* 96  BUN 19  < >  --  25* 29* 32* 40* 37* 29*  CREATININE 1.62*  < > 2.08* 2.75* 3.60* 4.04* 4.85* 5.10* 3.71*  CALCIUM 8.3*  --   --  8.4*  --  8.3* 8.2*  --  8.1*  MG 2.5*  --  2.4 2.4  --  2.4  --   --  2.4  PHOS 3.1  --   --  5.9*  --  6.6* 7.1*  --  4.6  < > = values in this interval not displayed.  Liver Function Tests:  Recent Labs Lab 06/12/17 0210 06/13/17 0320 06/14/17 0310 06/15/17 0312 06/15/17 1610 06/16/17 0200  AST 31 89* 78* 52*  --  36  ALT 36 24 24 22   --  19  ALKPHOS 98 71 83 99  --  88  BILITOT 0.5 2.2* 2.5* 3.2*  --  3.0*  PROT 6.0* 5.7* 6.3* 6.5  --  6.3*  ALBUMIN 2.6* 3.4* 3.2* 2.9* 2.8* 2.7*   No results for input(s): LIPASE, AMYLASE in the last 168 hours. No results for input(s): AMMONIA in the last 168 hours.  CBC:  Recent Labs Lab 06/12/17 0210  06/13/17 0320  06/13/17 1700 06/14/17 0310 06/14/17 1539 06/15/17 0312 06/15/17 1614 06/16/17 0200  WBC 9.9  < > 15.8*  --  24.4* 25.7*  --  24.5*  --  20.1*  NEUTROABS 6.6  --  13.4*  --   --  22.6*  --  21.7*  --  17.3*  HGB 8.5*  < > 9.6*  < > 9.6* 8.8* 10.2* 8.8* 9.5* 8.1*  HCT 27.5*  < > 28.9*  < > 28.8* 27.0* 30.0* 25.9* 28.0* 22.7*  MCV 80.9  < > 78.3  --  78.9 79.2  --  77.8*  --  74.9*  PLT 188  < > 123*  --  133* 125*  --  158  --  144*  < > = values in this interval not displayed.  INR:  Recent Labs Lab 06/12/17 2137 06/13/17 0320 06/14/17 0310 06/15/17 0312 06/16/17 0200  INR 1.28 1.23 1.44 1.47 1.93    Other results:  EKG:    Imaging   US Renal  Result Date: 06/15/2017 CLINICAL DATA:  Acute renal  failure EXAM: RENAL / URINARY TRACT ULTRASOUND COMPLETE COMPARISON:  02/11/2008 CT with contrast FINDINGS: Right Kidney: Length: 8.9 cm. Slight increased echogenicity suspicious for medical renal disease. Trace perinephric fluid noted. No acute obstruction or hydronephrosis. Renal atrophy noted. Left Kidney: Length: 10.5 cm. Limited visualization because of body habitus. No acute hydronephrosis. No significant focal abnormality. Bladder: Collapsed by Foley catheter. IMPRESSION: No acute finding or hydronephrosis. Limited exam because of body habitus. Electronically Signed   By: Jerilynn Mages.  Shick M.D.   On: 06/15/2017 16:46   Dg Chest Port 1 View  Result Date: 06/16/2017 CLINICAL DATA:  Chest pain. Status post left ventricular assist device placement. EXAM: PORTABLE CHEST 1 VIEW COMPARISON:  06/15/2017. FINDINGS: Stable enlarged cardiac silhouette and LVAD. Stable left chest tubes. No pneumothorax. One right jugular catheter tip at the junction of the superior vena cava and right atrium. A second right jugular catheter with its tip in the upper right atrium. Add right subclavian catheter is also demonstrated with its tip in the mid to lower right atrium. Stable left basilar airspace opacity. Interval minimal right basilar airspace opacity. Unremarkable bones. IMPRESSION: 1. Stable left basilar atelectasis. 2. Interval minimal right basilar atelectasis or pneumonia. 3. Stable cardiomegaly. Electronically Signed   By: Claudie Revering M.D.   On: 06/16/2017 07:26   Dg Chest Port 1 View  Result Date: 06/15/2017 CLINICAL DATA:  Right-sided central line placement EXAM: PORTABLE CHEST 1 VIEW COMPARISON:  06/15/2017 FINDINGS: Right jugular central venous sheath with the tip projecting over the SVC. Right jugular central venous catheter with the tip projecting over the cavoatrial junction. Right subclavian central venous catheter with the tip projecting over the SVC. Mild bilateral interstitial prominence. No pneumothorax.  Stable left-sided chest tubes. Trace left pleural fluid. Stable cardiomegaly.  Left ventricular assist device is present. Dual lead cardiac pacemaker is present. IMPRESSION: 1. Right jugular central venous sheath with the tip projecting over the SVC. 2. Right jugular central venous catheter with the tip projecting over the cavoatrial junction. 3. Right subclavian central venous catheter with the tip projecting over the SVC. Electronically Signed   By: Kathreen Devoid   On: 06/15/2017 15:55   Dg Chest Port 1 View  Result Date: 06/15/2017 CLINICAL DATA:  CVL placement. EXAM: PORTABLE CHEST 1 VIEW COMPARISON:  Chest x-ray from same day at 05:51. FINDINGS: Interval placement of a right subclavian central venous catheter with the tip in the left brachiocephalic vein. Left chest wall AICD and LVAD device are unchanged in position. Left-sided chest tubes and mediastinal drain are also unchanged. Right internal jugular sheath again noted. Stable cardiomegaly. Low lung volumes. Unchanged perihilar opacities and left pleural effusion with adjacent left basilar opacity. Right basilar atelectasis appears unchanged. IMPRESSION: 1. Interval placement of a right subclavian central venous catheter with the tip projecting over the left brachiocephalic vein. Recommend repositioning. 2. Stable moderate interstitial pulmonary edema and left pleural effusion with adjacent left lung base airspace disease, atelectasis versus infiltrate. These results will be called to the ordering clinician or representative by the Radiologist Assistant, and communication documented in the PACS or zVision Dashboard. Electronically Signed   By: Titus Dubin M.D.   On: 06/15/2017 13:22   Dg Chest Port 1 View  Result Date: 06/15/2017 CLINICAL DATA:  Left ventricular assist device.  Chest soreness. EXAM: PORTABLE CHEST 1 VIEW COMPARISON:  1 day prior FINDINGS: Pacer/AICD device with leads at right atrium and right ventricle. Interval removal of  Swan-Ganz catheter with right IJ line remaining in place. Mediastinal drain. Left chest tube. Left ventricular assist device unchanged in position. Midline trachea. Cardiomegaly accentuated by AP portable technique. Left pleural effusion is felt to be slightly increased. No pneumothorax. Worsening moderate interstitial edema. Increased left and persistent right base predominant airspace disease. IMPRESSION: Overall worsened aeration, with progressive congestive failure, left pleural fluid, and left base airspace disease. Support apparatus as detailed above.  No pneumothorax. Electronically Signed   By: Abigail Miyamoto M.D.   On: 06/15/2017 07:47     Medications:     Scheduled Medications: . acetaminophen  1,000 mg Oral Q6H   Or  . acetaminophen (TYLENOL) oral liquid 160 mg/5 mL  1,000 mg Per Tube Q6H  . aspirin EC  325 mg Oral Daily   Or  . aspirin  324 mg Per Tube Daily   Or  . aspirin  300 mg Rectal Daily  . atorvastatin  80 mg Oral q1800  . bisacodyl  10 mg Oral Daily   Or  . bisacodyl  10 mg Rectal Daily  . chlorhexidine  15 mL Mouth Rinse BID  . Chlorhexidine Gluconate Cloth  6 each Topical Daily  . docusate sodium  200 mg Oral Daily  . insulin aspart  0-24 Units Subcutaneous Q4H  . mouth rinse  15 mL Mouth Rinse q12n4p  . metoCLOPramide (REGLAN) injection  10 mg Intravenous Q6H  . metolazone  5 mg Oral Daily  . mupirocin ointment   Nasal BID  . pantoprazole  40 mg Oral Daily  . sodium chloride flush  10-40 mL Intracatheter Q12H  . sodium chloride flush  3 mL Intravenous Q12H  . warfarin  5 mg Oral q1800  . Warfarin - Physician Dosing Inpatient   Does not apply q1800    Infusions: .  sodium chloride Stopped (06/14/17 1400)  . sodium chloride Stopped (06/13/17 1100)  . sodium chloride 10 mL/hr at 06/16/17 0700  . amiodarone 30 mg/hr (06/16/17 0500)  . amiodarone 60 mg/hr (06/16/17 0700)   Followed by  . amiodarone    . DOPamine 3 mcg/kg/min (06/16/17 0700)  . EPINEPHrine 4  mg in dextrose 5% 250 mL infusion (16 mcg/mL) Stopped (06/15/17 0846)  . heparin 800 Units/hr (06/16/17 0700)  . lactated ringers Stopped (06/14/17 1400)  . lactated ringers Stopped (06/15/17 1901)  . milrinone 0.375 mcg/kg/min (06/16/17 0700)  . nitroGLYCERIN Stopped (06/13/17 1700)  . piperacillin-tazobactam Stopped (06/16/17 0603)  . dialysis replacement fluid (prismasate) 200 mL/hr at 06/15/17 1650  . dialysis replacement fluid (prismasate) 300 mL/hr at 06/15/17 1650  . dialysate (PRISMASATE) 2,000 mL/hr at 06/16/17 0313  . sodium chloride      PRN Medications: sodium chloride, heparin, hydrALAZINE, midazolam, morphine injection, ondansetron (ZOFRAN) IV, oxyCODONE, promethazine, sodium chloride, sodium chloride flush, sodium chloride flush, traMADol   Patient Profile   51 yo with history of CAD s/p anterior MI in 2010 and chronic systolic CHF (ischemic cardiomyopathy) as well as CKD stage III now s/p HeartMate 3 VAD placement.   Assessment/Plan:    1. Acute on chronic systolic CHF: Ischemic cardiomyopathy. [employer] ICD. NSTEMI in March 2018 with DES to LAD and RCA, complicated by cardiogenic shock, low output requiring milrinone. Most recent echo 8/18 with EF 20-25%, moderate MR. NYHA class IIIb-IVsymptoms (fluctuate).He was admitted and started on milrinone with improved creatinine.   HeartMate 3 VAD placement on 10/8. Speed cut back to 5400 today with A fib.  - Remains on milrinone 0.3 mcg and dopamine 3. Todays CO-OX is 62.5%.  - Restart CVVH, UF at 100 cc/hr, increase as tolerated.  - On warfarin and heparin. Dosing per CT surgery => INR 1.9 today.  - Continue warfarin with INR goal 2-3 (aim slightly higher with h/o Factor V Leiden heterozygosity) and ASA 325.  2. AKI on CKD stage 3: Now on CVVH.  No UF last night, marked volume overload.  - Start UF today at 100 cc/hr, titrate up as tolerates.    3. CAD: NSTEMI in 3/18. LHC with 99% ulcerated lesion proximal RCA with  left to right collaterals, 95% mid LAD stenosis after mid LAD stent. s/p PCI to RCA and LAD on 11/25/16.  - Off plavix for LVAD.  - Continue statin.   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 Recent V/Q scan did not show acute or chronic PE.  - He is a heterozygote for Factor V Leiden, plan for for ASA 325 and warfarin INR 2-3 with LVAD. No change. Anticoagulation per Dr Darcey Nora.   5. Chronic N/V/D with gastroparesis: Tolerating full liquid diet.    6. DM: On Jardiance at home, will cover with sliding scale. Glucose stable.   7. S/P Multiple Tooth Extractions 4-15, 19-23, and 27-29.  8. Atrial fibrillation: With RVR. Started last night.  On amiodarone at 60 mg/hr.   9. ID: CXR with LLL atelectasis versus infiltrate. Afebrile.  - WBC 15.8 -> 25.7->24->20.  Likely inflammatory as he has been on post-op ABX with vanc and Zosyn.    I reviewed the LVAD parameters from today, and compared the results to the patient's prior recorded data.  No programming changes were made.  The LVAD is functioning within specified parameters.  The patient performs LVAD self-test daily.  LVAD interrogation was negative for any significant power changes, alarms or PI  events/speed drops.  LVAD equipment check completed and is in good working order.  Back-up equipment present.   LVAD education done on emergency procedures and precautions and reviewed exit site care.  Length of Stay: [place], NP 06/16/2017, 8:01 AM  VAD Team --- VAD ISSUES ONLY--- Pager [phone] (7am - 7am)  Advanced Heart Failure Team  Pager [phone] (M-F; 7a - 4p)  Please contact [facility] for night-coverage after hours (4p -7a ) and weekends on [email or web address]  Patient seen with NP, agree with the above note.  I performed an independent exam and assessment, I made appropriate adjustments to the above note.   Patient has gone into atrial fibrillation with RVR, HR 110s-120s this morning on amiodarone gtt at 60. Continue at this rate  for now.  Co-ox 63%, CVP up to 21.  He is off epinephrine and remains on dopamine 3, milrinone 0.375, NO 4 ppm.  He has had AKI and is now on CVVH. He has not been hypotensive that we have noted but suspect peri-op ATN.  Hopefully CVVH will decompress RV and lower renal venous pressure.   - Need to restart UF this morning, start 100 cc/hr and increase as tolerated as long as we do not compromise MAP.   Would favor sildenafil for RV support after tapering off NO.   Speed down to 5400 with afib/RVR last night.    WBCs downtrending, no fever.  On empiric abx.   He is now on heparin gtt + warfarin, INR 1.9.  Can stop heparin when INR > 2, would check in pm. LDH stable.   CRITICAL CARE Performed by: Loralie Champagne  Total critical care time: 40 minutes  Critical care time was exclusive of separately billable procedures and treating other patients.  Critical care was necessary to treat or prevent imminent or life-threatening deterioration.  Critical care was time spent personally by me on the following activities: development of treatment plan with patient and/or surrogate as well as nursing, discussions with consultants, evaluation of patient's response to treatment, examination of patient, obtaining history from patient or surrogate, ordering and performing treatments and interventions, ordering and review of laboratory studies, ordering and review of radiographic studies, pulse oximetry and re-evaluation of patient's condition.  [name] 06/16/2017 8:40 AM

## 2017-06-16 NOTE — Progress Notes (Signed)
HeartMate 3 Rounding Note   postop day #4  Subjective:    HeartMate 3 implantation October 8 with preoperative ischemic cardiomyopathy, advanced heart failure on IV inotropes, indication was destination therapy  Patient stable postop day 1. Extubated  A.m. Postop day 1 Stable LVAD parameters with flow 4.2 L/m.    Acute on chronic renal failure with wt gain, increased volume, increased pulsatility- VAD speed up to 5600- now back to 5400 rpm with PI events  Oliguric Acute renal failure with rising cvp- trialysis cath placed today and CRT started goal 100cc/hr removal for increased wt, cvp IV heparin low-dose with history of DVT and factor V Leiden deficiency. PTT 55 INR slowly rising with po coumadin- now 2.0    LVAD INTERROGATION:  HeartMate IIILVAD:  Flow 4.5 liters/min, speed 5400, power 4.0, PI 3.0.  Controller Serial intact.   Objective:    Vital Signs:   Temp:  [97 F (36.1 C)-98.3 F (36.8 C)] 97 F (36.1 C) (10/12 0700) Pulse Rate:  [29-138] 117 (10/12 0915) Resp:  [0-29] 22 (10/12 0915) BP: (71-144)/(24-134) 120/73 (10/12 0915) SpO2:  [78 %-98 %] 91 % (10/12 0915) Weight:  [226 lb 10.1 oz (102.8 kg)] 226 lb 10.1 oz (102.8 kg) (10/12 0615) Last BM Date: 06/11/17 Mean arterial Pressure 85 mmHg  Intake/Output:   Intake/Output Summary (Last 24 hours) at 06/16/17 0957 Last data filed at 06/16/17 0700  Gross per 24 hour  Intake          1795.91 ml  Output             1519 ml  Net           276.91 ml     Physical Exam: General:  Well appearing. No resp difficulty HEENT: normal Neck: supple. JVP .; no bruits. No lymphadenopathy or thryomegaly appreciated. Cor: Mechanical heart sounds with LVAD hum present. Lungs: clear Abdomen: soft, nontender, nondistended. No hepatosplenomegaly. No bruits or masses. Good bowel sounds. Extremities: no cyanosis, clubbing, rash, mild edema of the extremities edema Neuro: alert & orientedx3, cranial nerves grossly intact. moves all 4  extremities w/o difficulty. Affect pleasant  Telemetry: A paced at 88/m  Labs: Basic Metabolic Panel:  Recent Labs Lab 06/13/17 0320  06/13/17 1700 06/14/17 0310 06/14/17 1539 06/15/17 0312 06/15/17 1610 06/15/17 1614 06/16/17 0200  NA 134*  < >  --  130* 131* 130* 129* 129* 132*  K 3.9  < >  --  4.9 4.5 4.4 4.6 4.6 4.2  CL 103  < >  --  97* 95* 95* 93* 93* 96*  CO2 22  --   --  21*  --  23 21*  --  22  GLUCOSE 128*  < >  --  220* 148* 101* 108* 108* 96  BUN 19  < >  --  25* 29* 32* 40* 37* 29*  CREATININE 1.62*  < > 2.08* 2.75* 3.60* 4.04* 4.85* 5.10* 3.71*  CALCIUM 8.3*  --   --  8.4*  --  8.3* 8.2*  --  8.1*  MG 2.5*  --  2.4 2.4  --  2.4  --   --  2.4  PHOS 3.1  --   --  5.9*  --  6.6* 7.1*  --  4.6  < > = values in this interval not displayed.  Liver Function Tests:  Recent Labs Lab 06/12/17 0210 06/13/17 0320 06/14/17 0310 06/15/17 0312 06/15/17 1610 06/16/17 0200  AST 31 89* 78* 52*  --  36  ALT 36 24 24 22   --  19  ALKPHOS 98 71 83 99  --  88  BILITOT 0.5 2.2* 2.5* 3.2*  --  3.0*  PROT 6.0* 5.7* 6.3* 6.5  --  6.3*  ALBUMIN 2.6* 3.4* 3.2* 2.9* 2.8* 2.7*   No results for input(s): LIPASE, AMYLASE in the last 168 hours. No results for input(s): AMMONIA in the last 168 hours.  CBC:  Recent Labs Lab 06/12/17 0210  06/13/17 0320  06/13/17 1700 06/14/17 0310 06/14/17 1539 06/15/17 0312 06/15/17 1614 06/16/17 0200  WBC 9.9  < > 15.8*  --  24.4* 25.7*  --  24.5*  --  20.1*  NEUTROABS 6.6  --  13.4*  --   --  22.6*  --  21.7*  --  17.3*  HGB 8.5*  < > 9.6*  < > 9.6* 8.8* 10.2* 8.8* 9.5* 8.1*  HCT 27.5*  < > 28.9*  < > 28.8* 27.0* 30.0* 25.9* 28.0* 22.7*  MCV 80.9  < > 78.3  --  78.9 79.2  --  77.8*  --  74.9*  PLT 188  < > 123*  --  133* 125*  --  158  --  144*  < > = values in this interval not displayed.  INR:  Recent Labs Lab 06/12/17 2137 06/13/17 0320 06/14/17 0310 06/15/17 0312 06/16/17 0200  INR 1.28 1.23 1.44 1.47 1.93    Other  results:  EKG:   Imaging: US Renal  Result Date: 06/15/2017 CLINICAL DATA:  Acute renal failure EXAM: RENAL / URINARY TRACT ULTRASOUND COMPLETE COMPARISON:  02/11/2008 CT with contrast FINDINGS: Right Kidney: Length: 8.9 cm. Slight increased echogenicity suspicious for medical renal disease. Trace perinephric fluid noted. No acute obstruction or hydronephrosis. Renal atrophy noted. Left Kidney: Length: 10.5 cm. Limited visualization because of body habitus. No acute hydronephrosis. No significant focal abnormality. Bladder: Collapsed by Foley catheter. IMPRESSION: No acute finding or hydronephrosis. Limited exam because of body habitus. Electronically Signed   By: Jerilynn Mages.  Shick M.D.   On: 06/15/2017 16:46   Dg Chest Port 1 View  Result Date: 06/16/2017 CLINICAL DATA:  51 year old male status post cardiac surgery with left ventricular assist device in place EXAM: PORTABLE CHEST 1 VIEW COMPARISON:  Prior chest x-ray obtained earlier today at 5:52 a.m. FINDINGS: Stable right IJ vascular sheath and right IJ central venous catheter with the tips overlying the superior cavoatrial junction. A right subclavian central venous catheter is also present. The tip of that catheter overlies the upper right atrium. A left ventricular assist device is visualized. The patient is status post median sternotomy. Stable position of left subclavian approach cardiac rhythm maintenance device with leads projecting over the right atrium and right ventricle. Two right-sided chest tubes are in unchanged position. No evidence of pneumothorax. Stable appearance of the lungs compared earlier today with persistent very low inspiratory volumes, bibasilar atelectasis and bilateral layering pleural effusions. There is pulmonary vascular congestion without overt edema. A small amount of fluid is present within the fissures. No acute osseous abnormality. IMPRESSION: 1. Stable and satisfactory support apparatus. 2. No interval change in the  appearance of the lungs compared earlier this morning. Persistent very low inspiratory volumes, bibasilar atelectasis, small bilateral layering pleural effusions and pulmonary vascular congestion. Electronically Signed   By: Jacqulynn Cadet M.D.   On: 06/16/2017 09:23   Dg Chest Port 1 View  Result Date: 06/16/2017 CLINICAL DATA:  Chest pain. Status post left ventricular assist device placement.  EXAM: PORTABLE CHEST 1 VIEW COMPARISON:  06/15/2017. FINDINGS: Stable enlarged cardiac silhouette and LVAD. Stable left chest tubes. No pneumothorax. One right jugular catheter tip at the junction of the superior vena cava and right atrium. A second right jugular catheter with its tip in the upper right atrium. Add right subclavian catheter is also demonstrated with its tip in the mid to lower right atrium. Stable left basilar airspace opacity. Interval minimal right basilar airspace opacity. Unremarkable bones. IMPRESSION: 1. Stable left basilar atelectasis. 2. Interval minimal right basilar atelectasis or pneumonia. 3. Stable cardiomegaly. Electronically Signed   By: Claudie Revering M.D.   On: 06/16/2017 07:26   Dg Chest Port 1 View  Result Date: 06/15/2017 CLINICAL DATA:  Right-sided central line placement EXAM: PORTABLE CHEST 1 VIEW COMPARISON:  06/15/2017 FINDINGS: Right jugular central venous sheath with the tip projecting over the SVC. Right jugular central venous catheter with the tip projecting over the cavoatrial junction. Right subclavian central venous catheter with the tip projecting over the SVC. Mild bilateral interstitial prominence. No pneumothorax. Stable left-sided chest tubes. Trace left pleural fluid. Stable cardiomegaly. Left ventricular assist device is present. Dual lead cardiac pacemaker is present. IMPRESSION: 1. Right jugular central venous sheath with the tip projecting over the SVC. 2. Right jugular central venous catheter with the tip projecting over the cavoatrial junction. 3. Right  subclavian central venous catheter with the tip projecting over the SVC. Electronically Signed   By: Kathreen Devoid   On: 06/15/2017 15:55   Dg Chest Port 1 View  Result Date: 06/15/2017 CLINICAL DATA:  CVL placement. EXAM: PORTABLE CHEST 1 VIEW COMPARISON:  Chest x-ray from same day at 05:51. FINDINGS: Interval placement of a right subclavian central venous catheter with the tip in the left brachiocephalic vein. Left chest wall AICD and LVAD device are unchanged in position. Left-sided chest tubes and mediastinal drain are also unchanged. Right internal jugular sheath again noted. Stable cardiomegaly. Low lung volumes. Unchanged perihilar opacities and left pleural effusion with adjacent left basilar opacity. Right basilar atelectasis appears unchanged. IMPRESSION: 1. Interval placement of a right subclavian central venous catheter with the tip projecting over the left brachiocephalic vein. Recommend repositioning. 2. Stable moderate interstitial pulmonary edema and left pleural effusion with adjacent left lung base airspace disease, atelectasis versus infiltrate. These results will be called to the ordering clinician or representative by the Radiologist Assistant, and communication documented in the PACS or zVision Dashboard. Electronically Signed   By: Titus Dubin M.D.   On: 06/15/2017 13:22   Dg Chest Port 1 View  Result Date: 06/15/2017 CLINICAL DATA:  Left ventricular assist device.  Chest soreness. EXAM: PORTABLE CHEST 1 VIEW COMPARISON:  1 day prior FINDINGS: Pacer/AICD device with leads at right atrium and right ventricle. Interval removal of Swan-Ganz catheter with right IJ line remaining in place. Mediastinal drain. Left chest tube. Left ventricular assist device unchanged in position. Midline trachea. Cardiomegaly accentuated by AP portable technique. Left pleural effusion is felt to be slightly increased. No pneumothorax. Worsening moderate interstitial edema. Increased left and persistent  right base predominant airspace disease. IMPRESSION: Overall worsened aeration, with progressive congestive failure, left pleural fluid, and left base airspace disease. Support apparatus as detailed above.  No pneumothorax. Electronically Signed   By: Abigail Miyamoto M.D.   On: 06/15/2017 07:47     Medications:     Scheduled Medications: . acetaminophen  1,000 mg Oral Q6H   Or  . acetaminophen (TYLENOL) oral liquid 160 mg/5  mL  1,000 mg Per Tube Q6H  . aspirin EC  325 mg Oral Daily   Or  . aspirin  324 mg Per Tube Daily   Or  . aspirin  300 mg Rectal Daily  . atorvastatin  80 mg Oral q1800  . bisacodyl  10 mg Oral Daily   Or  . bisacodyl  10 mg Rectal Daily  . chlorhexidine  15 mL Mouth Rinse BID  . Chlorhexidine Gluconate Cloth  6 each Topical Daily  . docusate sodium  200 mg Oral Daily  . insulin aspart  0-24 Units Subcutaneous Q4H  . mouth rinse  15 mL Mouth Rinse q12n4p  . metoCLOPramide (REGLAN) injection  10 mg Intravenous Q6H  . mupirocin ointment   Nasal BID  . pantoprazole  40 mg Oral Daily  . sodium chloride flush  10-40 mL Intracatheter Q12H  . sodium chloride flush  3 mL Intravenous Q12H  . warfarin  2 mg Oral q1800  . Warfarin - Physician Dosing Inpatient   Does not apply q1800    Infusions: . sodium chloride Stopped (06/14/17 1400)  . sodium chloride Stopped (06/13/17 1100)  . sodium chloride 10 mL/hr at 06/16/17 0700  . amiodarone 30 mg/hr (06/16/17 0500)  . DOPamine 3 mcg/kg/min (06/16/17 0700)  . EPINEPHrine 4 mg in dextrose 5% 250 mL infusion (16 mcg/mL) Stopped (06/15/17 0846)  . heparin    . lactated ringers Stopped (06/14/17 1400)  . lactated ringers Stopped (06/15/17 1901)  . milrinone 0.375 mcg/kg/min (06/16/17 0901)  . nitroGLYCERIN Stopped (06/13/17 1700)  . piperacillin-tazobactam Stopped (06/16/17 0603)  . dialysis replacement fluid (prismasate) 200 mL/hr at 06/16/17 0900  . dialysis replacement fluid (prismasate) 300 mL/hr at 06/15/17 1650  .  dialysate (PRISMASATE) 2,000 mL/hr at 06/16/17 0901  . sodium chloride      PRN Medications: sodium chloride, heparin, hydrALAZINE, midazolam, ondansetron (ZOFRAN) IV, oxyCODONE, promethazine, sodium chloride, sodium chloride flush, sodium chloride flush, traMADol   Assessment:  HeartMate 3 VAD implant for ischemic cardiomyopathy Preoperative ventricular ectopy with permanent pacemaker-AICD Preoperative moderate chronic kidney disease stage III Preoperative hemicolectomy for granulomatous colitis Poorly controlled diabetes mellitus with hemoglobin A1c 10.0, neuropathy Peripheral vascular disease with ABIs 0.5 bilaterally  postoperative acute renal failure with rising creatinine 4.2 and oliguria- HD cath placed and CRT started Postop atrial fib Excellent VAD performance postop  Plan/Discussion:    Continue current low-dose inotropes  low-dose dopamine and  Continue 0.375 milrinone for RV function. Nitric oxide at 4 ppm  NSR on iv amiodarone w/ bolus as needed   evidence of acute on chronic renal insufficiency with low urine output and fluid retention and poor response to Lasix - CRT started  Start low-dose IV heparin for previous history of DVT and factor V Leiden deficiency. Oral Coumadin dosing started.   Patient walked in hall 100 feet 10-11 prior to CRT  DC L pleural tube today Full liq diet  I reviewed the LVAD parameters from today, and compared the results to the patient's prior recorded data.  No programming changes were made.  The LVAD is functioning within specified parameters.  The patient performs LVAD self-test daily.  LVAD interrogation was negative for any significant power changes, alarms or PI events/speed drops.  LVAD equipment check completed and is in good working order.  Back-up equipment present.   LVAD education done on emergency procedures and precautions and reviewed exit site care.  Length of Stay: Mason III 06/16/2017, 9:57 AM

## 2017-06-16 NOTE — Progress Notes (Signed)
LVAD Coordinator Rounding Note:  Admitted 06/07/2017 for hemodynamic optimization prior to LVAD placement. Full dental extractions prior to LVAD implant.  [id number] implated on 10/8/2018by Dr Craige Cotta Destination Therapycriteria.  Vital signs: HR: 106- AFIB Doppler: 110 Auto cuff: 123/82 O2 Sat: 92% on 5LNC NO: 4.5 PPM Wt:194>217>220>220>226lbs   LVAD interrogation reveals:   [id number]- adjusted last night for atrial fib Flow: 4.7 Power: 3.9w PI: 3.4 Alarms: none Events: none Hematocrit: 23- adjusted personally Fixed speed: 5400 Low speed limit: 5100  10/8>61 PI events 10/8> 1 low flow alarm 10/9> 4 PI events 10/12>7 PI events   Drive Line: Daily. Change daily per protocol using daily kits and silver strip.   Labs:  LDH trend:255>343>379>376>366  INR trend: 1.44>1.23>1.44>1.47>1.93  Anticoagulation Plan: -INR Goal: 2-3, warfarin restarted 10/9 -ASA Dose: 343m (will remain on this with factor V heterozygosity)  Blood Products:  - 2 units FFP in OR - 1 unit PC in OR - 1 unit PRBC's 10/12  Gtts: - Amiodarone at 60 mg/hr - Dopamine at 3 mcg/kg/min - Epinephrine - off this am - Milrinone 0.375 mcg/kg/min - Heparin per pharmacy   Device: -St Jude  -Therapies: off. Perm pacer set at 60 bpm. -Temp pacer set AAI at 88 bpm, MA-5  Arrythmias:Atrial fib started 10/12. Amiodarone started 06/12/17 initially for ventricular arrhythmia   Respiratory: extubated 10/9   Renal:  Creat trend: 1.54>1.62>2.75>4.0>3.71   Adverse Events on VAD: -none  Plan/Recommendations:   1. Please change drive-line dressing daily using daily dressing kits and silver strips. If Katrina (daughter) is available, ask her to observe dressing changes. 2. Please page VAD pager with any equipment concerns.  LBalinda QuailsRN, VAD Coordinator 24/7 pager [phone]

## 2017-06-16 NOTE — Progress Notes (Signed)
[facility] ROUNDING NOTE   Subjective:   Acute on chronic systolic heart failure with st Judes ICD  NSTEMI in March 2018 and DES to LAD and RCA  Complicated by cardiogenic shock and low output heart failure. He has required milrinone . The last Echo 8/18 showed EF of 20- 25 % and moderate MR. HeartMate 3 VAD placement 10/8. Baseline creatinine appears to be about variable but has been in the 2 range . It did improve with milrinone. Despite maximal diuretics he has failed to make urine and his creatinine continues to rise as does his volume status. He now is facing respiratory compromise with worsening edema on CXR. In addition  He has a history of diabetes and crohns disease with a hemicolectomy in 2003  He has Factor V Leiden deficiency and history of recurrent DVT  He is on coumadin and IV Heparin Catheter placed 10/11 CRRT started 10/11  Some atrial fibrillation with rapid rate last night being kept even  Noted that Dr Aundra Dubin would like to start some ultrafiltration today at 100cc/hour  Objective:  Vital signs in last 24 hours:  Temp:  [97 F (36.1 C)-98.3 F (36.8 C)] 97 F (36.1 C) (10/12 0700) Pulse Rate:  [29-138] 106 (10/12 0700) Resp:  [0-29] 15 (10/12 0700) BP: (71-144)/(44-134) 144/134 (10/12 0700) SpO2:  [78 %-98 %] 91 % (10/12 0700) Weight:  [226 lb 10.1 oz (102.8 kg)] 226 lb 10.1 oz (102.8 kg) (10/12 0615)  Weight change: 5 lb 11.7 oz (2.6 kg) Filed Weights   06/14/17 0445 06/15/17 0630 06/16/17 0615  Weight: 220 lb 3.8 oz (99.9 kg) 220 lb 14.4 oz (100.2 kg) 226 lb 10.1 oz (102.8 kg)    Intake/Output: I/O last 3 completed shifts: In: 3144.1 [I.V.:2276.6; Blood:367.5; IV Piggyback:500] Out: 2024 [Urine:280; Emesis/NG output:200; VVOHY:0737; Chest Tube:190]   Intake/Output this shift:  No intake/output data recorded.  CVS- irregular RR  JVP elevated   RS- CTA  Right IJ catheter  ABD- BS present soft non-distended LVAD site clean  EXT- no  edema   Basic Metabolic Panel:  Recent Labs Lab 06/13/17 0320  06/13/17 1700 06/14/17 0310 06/14/17 1539 06/15/17 0312 06/15/17 1610 06/15/17 1614 06/16/17 0200  NA 134*  < >  --  130* 131* 130* 129* 129* 132*  K 3.9  < >  --  4.9 4.5 4.4 4.6 4.6 4.2  CL 103  < >  --  97* 95* 95* 93* 93* 96*  CO2 22  --   --  21*  --  23 21*  --  22  GLUCOSE 128*  < >  --  220* 148* 101* 108* 108* 96  BUN 19  < >  --  25* 29* 32* 40* 37* 29*  CREATININE 1.62*  < > 2.08* 2.75* 3.60* 4.04* 4.85* 5.10* 3.71*  CALCIUM 8.3*  --   --  8.4*  --  8.3* 8.2*  --  8.1*  MG 2.5*  --  2.4 2.4  --  2.4  --   --  2.4  PHOS 3.1  --   --  5.9*  --  6.6* 7.1*  --  4.6  < > = values in this interval not displayed.  Liver Function Tests:  Recent Labs Lab 06/12/17 0210 06/13/17 0320 06/14/17 0310 06/15/17 0312 06/15/17 1610 06/16/17 0200  AST 31 89* 78* 52*  --  36  ALT 36 24 24 22   --  19  ALKPHOS 98 71 83 99  --  88  BILITOT 0.5 2.2* 2.5* 3.2*  --  3.0*  PROT 6.0* 5.7* 6.3* 6.5  --  6.3*  ALBUMIN 2.6* 3.4* 3.2* 2.9* 2.8* 2.7*   No results for input(s): LIPASE, AMYLASE in the last 168 hours. No results for input(s): AMMONIA in the last 168 hours.  CBC:  Recent Labs Lab 06/12/17 0210  06/13/17 0320  06/13/17 1700 06/14/17 0310 06/14/17 1539 06/15/17 0312 06/15/17 1614 06/16/17 0200  WBC 9.9  < > 15.8*  --  24.4* 25.7*  --  24.5*  --  20.1*  NEUTROABS 6.6  --  13.4*  --   --  22.6*  --  21.7*  --  17.3*  HGB 8.5*  < > 9.6*  < > 9.6* 8.8* 10.2* 8.8* 9.5* 8.1*  HCT 27.5*  < > 28.9*  < > 28.8* 27.0* 30.0* 25.9* 28.0* 22.7*  MCV 80.9  < > 78.3  --  78.9 79.2  --  77.8*  --  74.9*  PLT 188  < > 123*  --  133* 125*  --  158  --  144*  < > = values in this interval not displayed.  Cardiac Enzymes:  Recent Labs Lab 06/15/17 1218  CKTOTAL 309    BNP: Invalid input(s): POCBNP  CBG:  Recent Labs Lab 06/15/17 1229 06/15/17 1612 06/15/17 2002 06/15/17 2342 06/16/17 0344  GLUCAP  102* 112* 133* 107* 71*    Microbiology: Results for orders placed or performed during the hospital encounter of 06/07/17  Surgical pcr screen     Status: None   Collection Time: 06/07/17  5:52 PM  Result Value Ref Range Status   MRSA, PCR NEGATIVE NEGATIVE Final   Staphylococcus aureus NEGATIVE NEGATIVE Final    Comment: (NOTE) The Xpert SA Assay (FDA approved for NASAL specimens in patients 77 years of age and older), is one component of a comprehensive surveillance program. It is not intended to diagnose infection nor to guide or monitor treatment.   Surgical pcr screen     Status: None   Collection Time: 06/09/17  7:37 AM  Result Value Ref Range Status   MRSA, PCR NEGATIVE NEGATIVE Final   Staphylococcus aureus NEGATIVE NEGATIVE Final    Comment: (NOTE) The Xpert SA Assay (FDA approved for NASAL specimens in patients 5 years of age and older), is one component of a comprehensive surveillance program. It is not intended to diagnose infection nor to guide or monitor treatment.     Coagulation Studies:  Recent Labs  06/14/17 0310 06/15/17 0312 06/16/17 0200  LABPROT 17.4* 17.7* 21.9*  INR 1.44 1.47 1.93    Urinalysis:  Recent Labs  06/15/17 1615  COLORURINE AMBER*  LABSPEC 1.025  PHURINE 5.5  GLUCOSEU NEGATIVE  HGBUR MODERATE*  BILIRUBINUR NEGATIVE  KETONESUR NEGATIVE  PROTEINUR 30*  NITRITE NEGATIVE  LEUKOCYTESUR TRACE*      Imaging: US Renal  Result Date: 06/15/2017 CLINICAL DATA:  Acute renal failure EXAM: RENAL / URINARY TRACT ULTRASOUND COMPLETE COMPARISON:  02/11/2008 CT with contrast FINDINGS: Right Kidney: Length: 8.9 cm. Slight increased echogenicity suspicious for medical renal disease. Trace perinephric fluid noted. No acute obstruction or hydronephrosis. Renal atrophy noted. Left Kidney: Length: 10.5 cm. Limited visualization because of body habitus. No acute hydronephrosis. No significant focal abnormality. Bladder: Collapsed by Foley  catheter. IMPRESSION: No acute finding or hydronephrosis. Limited exam because of body habitus. Electronically Signed   By: Jerilynn Mages.  Shick M.D.   On: 06/15/2017 16:46   Dg Chest [facility]  1 View  Result Date: 06/16/2017 CLINICAL DATA:  Chest pain. Status post left ventricular assist device placement. EXAM: PORTABLE CHEST 1 VIEW COMPARISON:  06/15/2017. FINDINGS: Stable enlarged cardiac silhouette and LVAD. Stable left chest tubes. No pneumothorax. One right jugular catheter tip at the junction of the superior vena cava and right atrium. A second right jugular catheter with its tip in the upper right atrium. Add right subclavian catheter is also demonstrated with its tip in the mid to lower right atrium. Stable left basilar airspace opacity. Interval minimal right basilar airspace opacity. Unremarkable bones. IMPRESSION: 1. Stable left basilar atelectasis. 2. Interval minimal right basilar atelectasis or pneumonia. 3. Stable cardiomegaly. Electronically Signed   By: Claudie Revering M.D.   On: 06/16/2017 07:26   Dg Chest Port 1 View  Result Date: 06/15/2017 CLINICAL DATA:  Right-sided central line placement EXAM: PORTABLE CHEST 1 VIEW COMPARISON:  06/15/2017 FINDINGS: Right jugular central venous sheath with the tip projecting over the SVC. Right jugular central venous catheter with the tip projecting over the cavoatrial junction. Right subclavian central venous catheter with the tip projecting over the SVC. Mild bilateral interstitial prominence. No pneumothorax. Stable left-sided chest tubes. Trace left pleural fluid. Stable cardiomegaly. Left ventricular assist device is present. Dual lead cardiac pacemaker is present. IMPRESSION: 1. Right jugular central venous sheath with the tip projecting over the SVC. 2. Right jugular central venous catheter with the tip projecting over the cavoatrial junction. 3. Right subclavian central venous catheter with the tip projecting over the SVC. Electronically Signed   By: Kathreen Devoid   On: 06/15/2017 15:55   Dg Chest Port 1 View  Result Date: 06/15/2017 CLINICAL DATA:  CVL placement. EXAM: PORTABLE CHEST 1 VIEW COMPARISON:  Chest x-ray from same day at 05:51. FINDINGS: Interval placement of a right subclavian central venous catheter with the tip in the left brachiocephalic vein. Left chest wall AICD and LVAD device are unchanged in position. Left-sided chest tubes and mediastinal drain are also unchanged. Right internal jugular sheath again noted. Stable cardiomegaly. Low lung volumes. Unchanged perihilar opacities and left pleural effusion with adjacent left basilar opacity. Right basilar atelectasis appears unchanged. IMPRESSION: 1. Interval placement of a right subclavian central venous catheter with the tip projecting over the left brachiocephalic vein. Recommend repositioning. 2. Stable moderate interstitial pulmonary edema and left pleural effusion with adjacent left lung base airspace disease, atelectasis versus infiltrate. These results will be called to the ordering clinician or representative by the Radiologist Assistant, and communication documented in the PACS or zVision Dashboard. Electronically Signed   By: Titus Dubin M.D.   On: 06/15/2017 13:22   Dg Chest Port 1 View  Result Date: 06/15/2017 CLINICAL DATA:  Left ventricular assist device.  Chest soreness. EXAM: PORTABLE CHEST 1 VIEW COMPARISON:  1 day prior FINDINGS: Pacer/AICD device with leads at right atrium and right ventricle. Interval removal of Swan-Ganz catheter with right IJ line remaining in place. Mediastinal drain. Left chest tube. Left ventricular assist device unchanged in position. Midline trachea. Cardiomegaly accentuated by AP portable technique. Left pleural effusion is felt to be slightly increased. No pneumothorax. Worsening moderate interstitial edema. Increased left and persistent right base predominant airspace disease. IMPRESSION: Overall worsened aeration, with progressive congestive  failure, left pleural fluid, and left base airspace disease. Support apparatus as detailed above.  No pneumothorax. Electronically Signed   By: Abigail Miyamoto M.D.   On: 06/15/2017 07:47     Medications:   . sodium chloride Stopped (  06/14/17 1400)  . sodium chloride Stopped (06/13/17 1100)  . sodium chloride 10 mL/hr at 06/16/17 0700  . amiodarone 30 mg/hr (06/16/17 0500)  . amiodarone 60 mg/hr (06/16/17 0700)   Followed by  . amiodarone    . DOPamine 3 mcg/kg/min (06/16/17 0700)  . EPINEPHrine 4 mg in dextrose 5% 250 mL infusion (16 mcg/mL) Stopped (06/15/17 0846)  . heparin 800 Units/hr (06/16/17 0700)  . lactated ringers Stopped (06/14/17 1400)  . lactated ringers Stopped (06/15/17 1901)  . milrinone 0.375 mcg/kg/min (06/16/17 0700)  . nitroGLYCERIN Stopped (06/13/17 1700)  . piperacillin-tazobactam Stopped (06/16/17 0603)  . dialysis replacement fluid (prismasate) 200 mL/hr at 06/15/17 1650  . dialysis replacement fluid (prismasate) 300 mL/hr at 06/15/17 1650  . dialysate (PRISMASATE) 2,000 mL/hr at 06/16/17 0313  . sodium chloride     . acetaminophen  1,000 mg Oral Q6H   Or  . acetaminophen (TYLENOL) oral liquid 160 mg/5 mL  1,000 mg Per Tube Q6H  . aspirin EC  325 mg Oral Daily   Or  . aspirin  324 mg Per Tube Daily   Or  . aspirin  300 mg Rectal Daily  . atorvastatin  80 mg Oral q1800  . bisacodyl  10 mg Oral Daily   Or  . bisacodyl  10 mg Rectal Daily  . chlorhexidine  15 mL Mouth Rinse BID  . Chlorhexidine Gluconate Cloth  6 each Topical Daily  . docusate sodium  200 mg Oral Daily  . insulin aspart  0-24 Units Subcutaneous Q4H  . mouth rinse  15 mL Mouth Rinse q12n4p  . metoCLOPramide (REGLAN) injection  10 mg Intravenous Q6H  . mupirocin ointment   Nasal BID  . pantoprazole  40 mg Oral Daily  . sodium chloride flush  10-40 mL Intracatheter Q12H  . sodium chloride flush  3 mL Intravenous Q12H  . warfarin  5 mg Oral q1800  . Warfarin - Physician Dosing Inpatient    Does not apply q1800   sodium chloride, heparin, hydrALAZINE, midazolam, morphine injection, ondansetron (ZOFRAN) IV, oxyCODONE, promethazine, sodium chloride, sodium chloride flush, sodium chloride flush, traMADol  Assessment/ Plan:   Acute on chronic renal disease with some acute renal injury - this may be secondary to ATN from an ischemic insult although the patient was receiving vancomycin Level 59 not toxic.  Patient was receiving high dose statin - CPK 309   renal ultrasound asymmetric renal size but no hydronephrosis.   Volume Agree with Dr Aundra Dubin and will try to remove some fluid today  Acute on chronic heart failure  Requiring both pressors and LVAD  Placed 10/8  Anemia will follow check iron stores and consider starting ESA  Bones stable at this point and will continue to follow metabolic parameters  Access - IJ line place 10/11   Electrolytes stable  Acid base stable    LOS: 9 Jahaira Earnhart W @TODAY @8 :45 AM

## 2017-06-16 NOTE — Progress Notes (Signed)
Nutrition Follow-up  DOCUMENTATION CODES:   Not applicable  INTERVENTION:  - Ensure Enlive po TID, each supplement provides 350 kcal and 20 grams of protein  NUTRITION DIAGNOSIS:   Increased nutrient needs related to  (planned LVAD placement) as evidenced by estimated needs.  Ongoing   GOAL:   Patient will meet greater than or equal to 90% of their needs  Unmet   MONITOR:   Supplement acceptance, Diet advancement, PO intake, Labs  REASON FOR ASSESSMENT:   Consult Assessment of nutrition requirement/status (LVAD, pt edentulous)  ASSESSMENT:   Pt with PMH of CAD s/p MI, CHF, CKD, chronic N/V/D from gastroparesis, DM, admitted 10/3 for optimization prior to LVAD. Multiple teeth extractions 10/4.   Pt with LVAD placement 10/08. Discussed pt with RN. RN reports pt with continued poor appetite related to N/V. Pt with emesis (bile) at bedside this morning. Pt's diet advanced to full liquid diet. Pt's weight continues to trend upwards since last RD visit, and net positive 8.4 L since admission. Pt s/p initiation of CRRT 10/11, stopped over night and restarted this morning (100cc/hr). Given increased needs d/t initiation of CRRT and poor PO intake, RD to order Ensure over Glucerna for additional protein and calories. If PO intake continues to decrease pt may benefit from [place] tube placement.   Labs reviewed; CBG 107-151, Na 132  Medications reviewed; Dulcolax, Colace, sliding scale insulin, Reglan, Protonix, Coumadin  Diet Order:  Diet full liquid Room service appropriate? Yes; Fluid consistency: Thin; Fluid restriction: 1200 mL Fluid  Skin:  Wound (see comment) (incision at chest)  Last BM:  06/11/17  Height:   Ht Readings from Last 1 Encounters:  06/13/17 5' 8"  (1.727 m)    Weight:   Wt Readings from Last 1 Encounters:  06/16/17 226 lb 10.1 oz (102.8 kg)  Used admission weight of 191 lb  Ideal Body Weight:  70 kg  BMI:  Body mass index is 34.46  kg/m.  Estimated Nutritional Needs:   Kcal:  2500-2700  Protein:  130-145 grams   Fluid:  < 1.2 L/d  EDUCATION NEEDS:   Education needs addressed  Parks Ranger, MS, RDN, LDN 06/16/2017 12:35 PM

## 2017-06-17 ENCOUNTER — Inpatient Hospital Stay (HOSPITAL_COMMUNITY): Payer: BLUE CROSS/BLUE SHIELD

## 2017-06-17 LAB — CBC WITH DIFFERENTIAL/PLATELET
Basophils Absolute: 0 10*3/uL (ref 0.0–0.1)
Basophils Relative: 0 %
Eosinophils Absolute: 0.1 10*3/uL (ref 0.0–0.7)
Eosinophils Relative: 1 %
HCT: 25.2 % — ABNORMAL LOW (ref 39.0–52.0)
Hemoglobin: 8.6 g/dL — ABNORMAL LOW (ref 13.0–17.0)
Lymphocytes Relative: 8 %
Lymphs Abs: 1.2 10*3/uL (ref 0.7–4.0)
MCH: 26.1 pg (ref 26.0–34.0)
MCHC: 34.1 g/dL (ref 30.0–36.0)
MCV: 76.4 fL — ABNORMAL LOW (ref 78.0–100.0)
Monocytes Absolute: 1.9 10*3/uL — ABNORMAL HIGH (ref 0.1–1.0)
Monocytes Relative: 13 %
Neutro Abs: 11.5 10*3/uL — ABNORMAL HIGH (ref 1.7–7.7)
Neutrophils Relative %: 78 %
Platelets: 219 10*3/uL (ref 150–400)
RBC: 3.3 MIL/uL — ABNORMAL LOW (ref 4.22–5.81)
RDW: 16.2 % — ABNORMAL HIGH (ref 11.5–15.5)
WBC: 14.7 10*3/uL — ABNORMAL HIGH (ref 4.0–10.5)

## 2017-06-17 LAB — COMPREHENSIVE METABOLIC PANEL
ALT: 21 U/L (ref 17–63)
AST: 33 U/L (ref 15–41)
Albumin: 2.7 g/dL — ABNORMAL LOW (ref 3.5–5.0)
Alkaline Phosphatase: 108 U/L (ref 38–126)
Anion gap: 12 (ref 5–15)
BILIRUBIN TOTAL: 1.6 mg/dL — AB (ref 0.3–1.2)
BUN: 21 mg/dL — AB (ref 6–20)
CALCIUM: 8.1 mg/dL — AB (ref 8.9–10.3)
CO2: 26 mmol/L (ref 22–32)
CREATININE: 2.57 mg/dL — AB (ref 0.61–1.24)
Chloride: 99 mmol/L — ABNORMAL LOW (ref 101–111)
GFR calc Af Amer: 31 mL/min — ABNORMAL LOW (ref >60)
GFR, EST NON AFRICAN AMERICAN: 27 mL/min — AB (ref >60)
Glucose, Bld: 152 mg/dL — ABNORMAL HIGH (ref 65–99)
Potassium: 4.2 mmol/L (ref 3.5–5.1)
Sodium: 137 mmol/L (ref 135–145)
TOTAL PROTEIN: 6.6 g/dL (ref 6.5–8.1)

## 2017-06-17 LAB — PROTIME-INR
INR: 2.59
Prothrombin Time: 27.5 seconds — ABNORMAL HIGH (ref 11.4–15.2)

## 2017-06-17 LAB — COOXEMETRY PANEL
Carboxyhemoglobin: 1.2 % (ref 0.5–1.5)
Carboxyhemoglobin: 0.8 % (ref 0.5–1.5)
Methemoglobin: 1.1 % (ref 0.0–1.5)
Methemoglobin: 1.6 % — ABNORMAL HIGH (ref 0.0–1.5)
O2 Saturation: 60 %
O2 Saturation: 61.8 %
Total hemoglobin: 9.1 g/dL — ABNORMAL LOW (ref 12.0–16.0)
Total hemoglobin: 8.9 g/dL — ABNORMAL LOW (ref 12.0–16.0)

## 2017-06-17 LAB — RENAL FUNCTION PANEL
ALBUMIN: 2.6 g/dL — AB (ref 3.5–5.0)
ALBUMIN: 2.7 g/dL — AB (ref 3.5–5.0)
ANION GAP: 12 (ref 5–15)
Anion gap: 8 (ref 5–15)
BUN: 20 mg/dL (ref 6–20)
BUN: 20 mg/dL (ref 6–20)
CALCIUM: 8 mg/dL — AB (ref 8.9–10.3)
CALCIUM: 7.9 mg/dL — AB (ref 8.9–10.3)
CO2: 25 mmol/L (ref 22–32)
CO2: 26 mmol/L (ref 22–32)
CREATININE: 2.56 mg/dL — AB (ref 0.61–1.24)
Chloride: 99 mmol/L — ABNORMAL LOW (ref 101–111)
Chloride: 99 mmol/L — ABNORMAL LOW (ref 101–111)
Creatinine, Ser: 2.53 mg/dL — ABNORMAL HIGH (ref 0.61–1.24)
GFR calc Af Amer: 32 mL/min — ABNORMAL LOW (ref >60)
GFR calc Af Amer: 32 mL/min — ABNORMAL LOW (ref >60)
GFR calc non Af Amer: 27 mL/min — ABNORMAL LOW (ref >60)
GFR calc non Af Amer: 28 mL/min — ABNORMAL LOW (ref >60)
GLUCOSE: 148 mg/dL — AB (ref 65–99)
GLUCOSE: 175 mg/dL — AB (ref 65–99)
PHOSPHORUS: 2.2 mg/dL — AB (ref 2.5–4.6)
Phosphorus: 1.8 mg/dL — ABNORMAL LOW (ref 2.5–4.6)
Potassium: 5 mmol/L (ref 3.5–5.1)
Potassium: 5.7 mmol/L — ABNORMAL HIGH (ref 3.5–5.1)
SODIUM: 136 mmol/L (ref 135–145)
SODIUM: 133 mmol/L — AB (ref 135–145)

## 2017-06-17 LAB — GLUCOSE, CAPILLARY
GLUCOSE-CAPILLARY: 142 mg/dL — AB (ref 65–99)
GLUCOSE-CAPILLARY: 224 mg/dL — AB (ref 65–99)
Glucose-Capillary: 164 mg/dL — ABNORMAL HIGH (ref 65–99)
Glucose-Capillary: 174 mg/dL — ABNORMAL HIGH (ref 65–99)
Glucose-Capillary: 186 mg/dL — ABNORMAL HIGH (ref 65–99)

## 2017-06-17 LAB — POCT I-STAT, CHEM 8
BUN: 26 mg/dL — AB (ref 6–20)
CREATININE: 2.5 mg/dL — AB (ref 0.61–1.24)
Calcium, Ion: 1.07 mmol/L — ABNORMAL LOW (ref 1.15–1.40)
Chloride: 99 mmol/L — ABNORMAL LOW (ref 101–111)
GLUCOSE: 188 mg/dL — AB (ref 65–99)
HEMATOCRIT: 26 % — AB (ref 39.0–52.0)
Hemoglobin: 8.8 g/dL — ABNORMAL LOW (ref 13.0–17.0)
POTASSIUM: 5.7 mmol/L — AB (ref 3.5–5.1)
Sodium: 136 mmol/L (ref 135–145)
TCO2: 30 mmol/L (ref 22–32)

## 2017-06-17 LAB — TYPE AND SCREEN
ABO/RH(D): O POS
Antibody Screen: NEGATIVE
Unit division: 0

## 2017-06-17 LAB — BPAM RBC
Blood Product Expiration Date: 201811072359
ISSUE DATE / TIME: 201810120344
Unit Type and Rh: 5100

## 2017-06-17 LAB — MAGNESIUM: Magnesium: 2.6 mg/dL — ABNORMAL HIGH (ref 1.7–2.4)

## 2017-06-17 LAB — VANCOMYCIN, RANDOM: Vancomycin Rm: 36

## 2017-06-17 LAB — APTT
aPTT: 61 seconds — ABNORMAL HIGH (ref 24–36)
aPTT: 71 seconds — ABNORMAL HIGH (ref 24–36)

## 2017-06-17 LAB — LACTATE DEHYDROGENASE: LDH: 371 U/L — AB (ref 98–192)

## 2017-06-17 MED ORDER — WARFARIN - PHARMACIST DOSING INPATIENT
Freq: Every day | Status: DC
  Administered 2017-06-17 – 2017-06-28 (×6)
  Administered 2017-06-30: 1

## 2017-06-17 MED ORDER — DARBEPOETIN ALFA 100 MCG/0.5ML IJ SOSY
100.0000 ug | PREFILLED_SYRINGE | INTRAMUSCULAR | Status: DC
  Administered 2017-06-17 – 2017-06-24 (×2): 100 ug via INTRAVENOUS
  Filled 2017-06-17 (×3): qty 0.5

## 2017-06-17 MED ORDER — CLONIDINE HCL 0.2 MG/24HR TD PTWK
0.2000 mg | MEDICATED_PATCH | TRANSDERMAL | Status: DC
  Administered 2017-06-17: 0.2 mg via TRANSDERMAL
  Filled 2017-06-17: qty 1

## 2017-06-17 MED ORDER — SODIUM POLYSTYRENE SULFONATE 15 GM/60ML PO SUSP
30.0000 g | Freq: Once | ORAL | Status: AC
  Administered 2017-06-17: 30 g via ORAL
  Filled 2017-06-17: qty 120

## 2017-06-17 MED ORDER — PRISMASOL BGK 0/2.5 32-2.5 MEQ/L IV SOLN
INTRAVENOUS | Status: DC
  Administered 2017-06-17 – 2017-06-18 (×5): via INTRAVENOUS_CENTRAL
  Filled 2017-06-17 (×9): qty 5000

## 2017-06-17 MED ORDER — WARFARIN SODIUM 2.5 MG PO TABS
2.5000 mg | ORAL_TABLET | Freq: Once | ORAL | Status: AC
  Administered 2017-06-17: 2.5 mg via ORAL
  Filled 2017-06-17: qty 1

## 2017-06-17 NOTE — Progress Notes (Signed)
Pharmacy Antibiotic Note  [name] is a 51 y.o. male admitted on 06/07/2017 with surgical prophylaxis.  Pharmacy has been consulted for vancomycin dosing x 48 hrs post-op. Renal function worsening acutely, now on CRRT.  WBC up - per MD add Zosyn and continue vancomycin for now.   Vancomycin random level this AM = 36.  Last vancomycin dose given 10/10.  Plan: -Continue to hold vancomycin - check random level in morning tomorrow. -Follow renal plans -Continue Zosyn 3.375g IV EI q8h   Height: 5' 8"  (172.7 cm) Weight: 213 lb 13.5 oz (97 kg) IBW/kg (Calculated) : 68.4  Temp (24hrs), Avg:98.2 F (36.8 C), Min:97.7 F (36.5 C), Max:98.6 F (37 C)   Recent Labs Lab 06/14/17 0310  06/15/17 0312 06/15/17 1130  06/15/17 1614 06/16/17 0200 06/16/17 1140 06/16/17 1548 06/16/17 1600 06/17/17 0414  WBC 25.7*  --  24.5*  --   --   --  20.1* 16.7*  --   --  14.7*  CREATININE 2.75*  < > 4.04*  --   < > 5.10* 3.71*  --  3.03* 2.80* 2.57*  2.56*  VANCORANDOM  --   --   --  19*  --   --   --   --   --   --  36  < > = values in this interval not displayed.  Estimated Creatinine Clearance: 38 mL/min (A) (by C-G formula based on SCr of 2.57 mg/dL (H)).    Allergies  Allergen Reactions  . Metformin And Related Diarrhea    Antimicrobials this admission: Vancomycin 10/8 >>  Cefuroxime 10/8 >>10/10 Zosyn 10/10 >>  Dose adjustments this admission: 10/10: SCr up> vancomycin to q12h 10/11: SCr up > hold vancomycin 10/11 VR 59 > hold vancomycin 10/13 VR 36 > continue to hold vancomycin   Microbiology results: 10/5 MRSA PCR: neg  Thank you for allowing pharmacy to be a part of this patient's care.  Uvaldo Rising, BCPS  Clinical Pharmacist Pager [phone]  06/17/2017 8:42 AM

## 2017-06-17 NOTE — Progress Notes (Signed)
Pharmacy does not have new dialysate order ready yet, said need to check with MD regarding order first. Will use remaining dialysate 4/2.5 until new order figured out. Will continue to monitor closely.

## 2017-06-17 NOTE — Plan of Care (Signed)
Problem: Fluid Volume: Goal: Risk for excess fluid volume will decrease Outcome: Progressing Pt on CRRT.

## 2017-06-17 NOTE — Patient Care Conference (Signed)
Patients MAPs have been 92-108. PRN medication given with little difference noted. Dr Prescott Gum called and new order given. Will continue to monitor.

## 2017-06-17 NOTE — Progress Notes (Signed)
Patient ID: [name], male   DOB: 20-Feb-1966, 51 y.o.   MRN: [id number]   Advanced Heart Failure VAD Team Note  Subjective:    Admitted 10/3 for RHC/Swan implantation and optimization prior to LVAD.  HeartMate 3 VAD implanted 10/8.  CVVH begun 10/11  CVVH continues, net negative -2601 yesterday and weight down.  CVP remains 15-17 today.  Co-ox 60%.  He remains on dopamine 3 and milrinone 0.375.  MAP in 80s.    He is in NSR today with amiodarone 60 mg/hr.   Breathing improved, no complaints. UOP only about 50 cc for the day.   LVAD INTERROGATION:  HeartMate 3:  Flow 4.1 liters/min, speed 5400 rpm, power 3.9, PI 4.0.  3 PI events /24 hrs   Objective:    Vital Signs:   Temp:  [97.7 F (36.5 C)-98.6 F (37 C)] 98.6 F (37 C) (10/13 0400) Pulse Rate:  [29-126] 102 (10/13 0600) Resp:  [4-28] 11 (10/13 0600) BP: (77-181)/(24-166) 108/86 (10/13 0600) SpO2:  [88 %-100 %] 100 % (10/13 0600) Weight:  [213 lb 13.5 oz (97 kg)] 213 lb 13.5 oz (97 kg) (10/13 0500) Last BM Date: 06/11/17 Mean arterial Pressure 80s  Intake/Output:   Intake/Output Summary (Last 24 hours) at 06/17/17 0709 Last data filed at 06/17/17 0600  Gross per 24 hour  Intake          1914.17 ml  Output             4515 ml  Net         -2600.83 ml     Physical Exam: CVP 15-17 GENERAL: Well appearing this am. NAD.  HEENT: Normal. NECK: Supple, JVP 10-12 cm.  CARDIAC:  Mechanical heart sounds with LVAD hum present.  LUNGS:  CTAB, normal effort.  ABDOMEN:  NT, ND, no HSM. No bruits or masses. +BS  LVAD exit site: Well-healed and incorporated. Dressing dry and intact. No erythema or drainage. Stabilization device present and accurately applied. Driveline dressing changed daily per sterile technique. EXTREMITIES:  Warm and dry. No cyanosis, clubbing, rash.  Trace ankle edema.   NEUROLOGIC:  Alert & oriented x 3. Cranial nerves grossly intact. Moves all 4 extremities w/o difficulty. Affect pleasant       Telemetry   Sinus rate 100 (personally reviewed)  Labs   Basic Metabolic Panel:  Recent Labs Lab 06/13/17 1700 06/14/17 0310  06/15/17 0312 06/15/17 1610 06/15/17 1614 06/16/17 0200 06/16/17 1548 06/16/17 1600 06/17/17 0414  NA  --  130*  < > 130* 129* 129* 132* 135 136 137  136  K  --  4.9  < > 4.4 4.6 4.6 4.2 4.3 4.2 4.2  5.0  CL  --  97*  < > 95* 93* 93* 96* 99* 100* 99*  99*  CO2  --  21*  --  23 21*  --  22 24  --  26  25  GLUCOSE  --  220*  < > 101* 108* 108* 96 149* 147* 152*  148*  BUN  --  25*  < > 32* 40* 37* 29* 23* 23* 21*  20  CREATININE 2.08* 2.75*  < > 4.04* 4.85* 5.10* 3.71* 3.03* 2.80* 2.57*  2.56*  CALCIUM  --  8.4*  --  8.3* 8.2*  --  8.1* 8.0*  --  8.1*  8.0*  MG 2.4 2.4  --  2.4  --   --  2.4  --   --  2.6*  PHOS  --  5.9*  --  6.6* 7.1*  --  4.6 3.3  --  2.2*  < > = values in this interval not displayed.  Liver Function Tests:  Recent Labs Lab 06/13/17 0320 06/14/17 0310 06/15/17 0312 06/15/17 1610 06/16/17 0200 06/16/17 1548 06/17/17 0414  AST 89* 78* 52*  --  36  --  33  ALT 24 24 22   --  19  --  21  ALKPHOS 71 83 99  --  88  --  108  BILITOT 2.2* 2.5* 3.2*  --  3.0*  --  1.6*  PROT 5.7* 6.3* 6.5  --  6.3*  --  6.6  ALBUMIN 3.4* 3.2* 2.9* 2.8* 2.7* 2.7* 2.7*  2.6*   No results for input(s): LIPASE, AMYLASE in the last 168 hours. No results for input(s): AMMONIA in the last 168 hours.  CBC:  Recent Labs Lab 06/13/17 0320  06/14/17 0310  06/15/17 0312 06/15/17 1614 06/16/17 0200 06/16/17 1140 06/16/17 1600 06/17/17 0414  WBC 15.8*  < > 25.7*  --  24.5*  --  20.1* 16.7*  --  14.7*  NEUTROABS 13.4*  --  22.6*  --  21.7*  --  17.3*  --   --  11.5*  HGB 9.6*  < > 8.8*  < > 8.8* 9.5* 8.1* 8.5* 9.9* 8.6*  HCT 28.9*  < > 27.0*  < > 25.9* 28.0* 22.7* 24.7* 29.0* 25.2*  MCV 78.3  < > 79.2  --  77.8*  --  74.9* 76.2*  --  76.4*  PLT 123*  < > 125*  --  158  --  144* 164  --  219  < > = values in this interval not  displayed.  INR:  Recent Labs Lab 06/14/17 0310 06/15/17 0312 06/16/17 0200 06/16/17 1554 06/17/17 0414  INR 1.44 1.47 1.93 2.59 2.59    Other results:  EKG:    Imaging   US Renal  Result Date: 06/15/2017 CLINICAL DATA:  Acute renal failure EXAM: RENAL / URINARY TRACT ULTRASOUND COMPLETE COMPARISON:  02/11/2008 CT with contrast FINDINGS: Right Kidney: Length: 8.9 cm. Slight increased echogenicity suspicious for medical renal disease. Trace perinephric fluid noted. No acute obstruction or hydronephrosis. Renal atrophy noted. Left Kidney: Length: 10.5 cm. Limited visualization because of body habitus. No acute hydronephrosis. No significant focal abnormality. Bladder: Collapsed by Foley catheter. IMPRESSION: No acute finding or hydronephrosis. Limited exam because of body habitus. Electronically Signed   By: Jerilynn Mages.  Shick M.D.   On: 06/15/2017 16:46   Dg Chest Port 1 View  Result Date: 06/16/2017 CLINICAL DATA:  51 year old male status post cardiac surgery with left ventricular assist device in place EXAM: PORTABLE CHEST 1 VIEW COMPARISON:  Prior chest x-ray obtained earlier today at 5:52 a.m. FINDINGS: Stable right IJ vascular sheath and right IJ central venous catheter with the tips overlying the superior cavoatrial junction. A right subclavian central venous catheter is also present. The tip of that catheter overlies the upper right atrium. A left ventricular assist device is visualized. The patient is status post median sternotomy. Stable position of left subclavian approach cardiac rhythm maintenance device with leads projecting over the right atrium and right ventricle. Two right-sided chest tubes are in unchanged position. No evidence of pneumothorax. Stable appearance of the lungs compared earlier today with persistent very low inspiratory volumes, bibasilar atelectasis and bilateral layering pleural effusions. There is pulmonary vascular congestion without overt edema. A small amount  of fluid is present within the fissures. No  acute osseous abnormality. IMPRESSION: 1. Stable and satisfactory support apparatus. 2. No interval change in the appearance of the lungs compared earlier this morning. Persistent very low inspiratory volumes, bibasilar atelectasis, small bilateral layering pleural effusions and pulmonary vascular congestion. Electronically Signed   By: Jacqulynn Cadet M.D.   On: 06/16/2017 09:23   Dg Chest Port 1 View  Result Date: 06/16/2017 CLINICAL DATA:  Chest pain. Status post left ventricular assist device placement. EXAM: PORTABLE CHEST 1 VIEW COMPARISON:  06/15/2017. FINDINGS: Stable enlarged cardiac silhouette and LVAD. Stable left chest tubes. No pneumothorax. One right jugular catheter tip at the junction of the superior vena cava and right atrium. A second right jugular catheter with its tip in the upper right atrium. Add right subclavian catheter is also demonstrated with its tip in the mid to lower right atrium. Stable left basilar airspace opacity. Interval minimal right basilar airspace opacity. Unremarkable bones. IMPRESSION: 1. Stable left basilar atelectasis. 2. Interval minimal right basilar atelectasis or pneumonia. 3. Stable cardiomegaly. Electronically Signed   By: Claudie Revering M.D.   On: 06/16/2017 07:26   Dg Chest Port 1 View  Result Date: 06/15/2017 CLINICAL DATA:  Right-sided central line placement EXAM: PORTABLE CHEST 1 VIEW COMPARISON:  06/15/2017 FINDINGS: Right jugular central venous sheath with the tip projecting over the SVC. Right jugular central venous catheter with the tip projecting over the cavoatrial junction. Right subclavian central venous catheter with the tip projecting over the SVC. Mild bilateral interstitial prominence. No pneumothorax. Stable left-sided chest tubes. Trace left pleural fluid. Stable cardiomegaly. Left ventricular assist device is present. Dual lead cardiac pacemaker is present. IMPRESSION: 1. Right jugular central  venous sheath with the tip projecting over the SVC. 2. Right jugular central venous catheter with the tip projecting over the cavoatrial junction. 3. Right subclavian central venous catheter with the tip projecting over the SVC. Electronically Signed   By: Kathreen Devoid   On: 06/15/2017 15:55   Dg Chest Port 1 View  Result Date: 06/15/2017 CLINICAL DATA:  CVL placement. EXAM: PORTABLE CHEST 1 VIEW COMPARISON:  Chest x-ray from same day at 05:51. FINDINGS: Interval placement of a right subclavian central venous catheter with the tip in the left brachiocephalic vein. Left chest wall AICD and LVAD device are unchanged in position. Left-sided chest tubes and mediastinal drain are also unchanged. Right internal jugular sheath again noted. Stable cardiomegaly. Low lung volumes. Unchanged perihilar opacities and left pleural effusion with adjacent left basilar opacity. Right basilar atelectasis appears unchanged. IMPRESSION: 1. Interval placement of a right subclavian central venous catheter with the tip projecting over the left brachiocephalic vein. Recommend repositioning. 2. Stable moderate interstitial pulmonary edema and left pleural effusion with adjacent left lung base airspace disease, atelectasis versus infiltrate. These results will be called to the ordering clinician or representative by the Radiologist Assistant, and communication documented in the PACS or zVision Dashboard. Electronically Signed   By: Titus Dubin M.D.   On: 06/15/2017 13:22     Medications:     Scheduled Medications: . acetaminophen  1,000 mg Oral Q6H   Or  . acetaminophen (TYLENOL) oral liquid 160 mg/5 mL  1,000 mg Per Tube Q6H  . aspirin EC  325 mg Oral Daily   Or  . aspirin  324 mg Per Tube Daily   Or  . aspirin  300 mg Rectal Daily  . atorvastatin  80 mg Oral q1800  . bisacodyl  10 mg Oral Daily   Or  .  bisacodyl  10 mg Rectal Daily  . chlorhexidine  15 mL Mouth Rinse BID  . Chlorhexidine Gluconate Cloth  6  each Topical Daily  . docusate sodium  200 mg Oral Daily  . feeding supplement (ENSURE ENLIVE)  237 mL Oral TID WC  . insulin aspart  0-24 Units Subcutaneous Q4H  . mouth rinse  15 mL Mouth Rinse q12n4p  . metoCLOPramide (REGLAN) injection  10 mg Intravenous Q6H  . mupirocin ointment   Nasal BID  . pantoprazole  40 mg Oral Daily  . sodium chloride flush  10-40 mL Intracatheter Q12H  . sodium chloride flush  3 mL Intravenous Q12H  . warfarin  2 mg Oral q1800  . Warfarin - Physician Dosing Inpatient   Does not apply q1800    Infusions: . sodium chloride Stopped (06/14/17 1400)  . sodium chloride Stopped (06/13/17 1100)  . sodium chloride 10 mL/hr at 06/16/17 1900  . amiodarone 60 mg/hr (06/17/17 0600)  . DOPamine 3 mcg/kg/min (06/17/17 0600)  . EPINEPHrine 4 mg in dextrose 5% 250 mL infusion (16 mcg/mL) Stopped (06/15/17 0846)  . lactated ringers Stopped (06/14/17 1400)  . lactated ringers Stopped (06/15/17 1901)  . milrinone 0.375 mcg/kg/min (06/17/17 0600)  . nitroGLYCERIN Stopped (06/13/17 1700)  . piperacillin-tazobactam 3.375 g (06/17/17 3903)  . dialysis replacement fluid (prismasate) 200 mL/hr at 06/16/17 1654  . dialysis replacement fluid (prismasate) 300 mL/hr at 06/17/17 0140  . dialysate (PRISMASATE) 2,000 mL/hr at 06/17/17 0618  . sodium chloride      PRN Medications: sodium chloride, heparin, hydrALAZINE, midazolam, ondansetron (ZOFRAN) IV, oxyCODONE, promethazine, sodium chloride, sodium chloride flush, sodium chloride flush, traMADol   Patient Profile   51 yo with history of CAD s/p anterior MI in 2010 and chronic systolic CHF (ischemic cardiomyopathy) as well as CKD stage III now s/p HeartMate 3 VAD placement.   Assessment/Plan:    1. Acute on chronic systolic CHF: Ischemic cardiomyopathy. [employer] ICD. NSTEMI in March 2018 with DES to LAD and RCA, complicated by cardiogenic shock, low output requiring milrinone. Most recent echo 8/18 with EF 20-25%, moderate  MR. He was admitted and started on milrinone with improved creatinine.  HeartMate 3 VAD placement on 10/8. Speed cut back to 5400 with onset of afib/RVR 10/11.  Stable today, MAP 80s generally.  He remains on milrinone 0.375 and dopamine 3.  He is on CVVH now due to AKI on CKD.  CVP 15-17, co-ox 60%.  - Continue CVVH with UF 150 cc/hr today given ongoing volume overload.  - Decrease dopamine to 2.  - Continue milrinone 0.375.  - INR 2.6, off heparin gtt and remains on warfarin. Continue warfarin with INR goal 2-3 (aim slightly higher with h/o Factor V Leiden heterozygosity) and ASA 325. LDH stable.  - Favor sildenafil for RV support after tapering off NO.  2. AKI on CKD stage 3: Now on CVVH. Tolerated UF 150 cc/hr well yesterday, CVP remains elevated.  Minimal UOP.  Continue CVVH. Hope for recovery.  He has not been hypotensive that we have noted but suspect peri-op ATN. 3. CAD: NSTEMI in 3/18. LHC with 99% ulcerated lesion proximal RCA with left to right collaterals, 95% mid LAD stenosis after mid LAD stent. s/p PCI to RCA and LAD on 11/25/16.  - Off plavix for LVAD.  - Continue statin.   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 Recent V/Q scan did not show acute or chronic PE.  - He is a heterozygote for Factor  V Leiden, plan for for ASA 325 and warfarin INR 2-3 with LVAD. No change.  5. Chronic N/V/D with gastroparesis: Tolerating diet.    6. DM: On Jardiance at home, will cover with sliding scale. Glucose stable.   7. S/P Multiple Tooth Extractions 4-15, 19-23, and 27-29.  8. Atrial fibrillation: Paroxysmal.  Back in NSR.  Can decrease amiodarone to 30 mg/hr today.    9. ID: CXR with LLL atelectasis versus infiltrate. Afebrile.  - WBC 15.8 -> 25.7->24->20-> 14.  Empiric ABX with vanc and Zosyn.    I reviewed the LVAD parameters from today, and compared the results to the patient's prior recorded data.  No programming changes were made.  The LVAD is functioning within specified  parameters.  The patient performs LVAD self-test daily.  LVAD interrogation was negative for any significant power changes, alarms or PI events/speed drops.  LVAD equipment check completed and is in good working order.  Back-up equipment present.   LVAD education done on emergency procedures and precautions and reviewed exit site care.  CRITICAL CARE Performed by: Loralie Champagne  Total critical care time: 40 minutes  Critical care time was exclusive of separately billable procedures and treating other patients.  Critical care was necessary to treat or prevent imminent or life-threatening deterioration.  Critical care was time spent personally by me on the following activities: development of treatment plan with patient and/or surrogate as well as nursing, discussions with consultants, evaluation of patient's response to treatment, examination of patient, obtaining history from patient or surrogate, ordering and performing treatments and interventions, ordering and review of laboratory studies, ordering and review of radiographic studies, pulse oximetry and re-evaluation of patient's condition.  Taym Twist 06/17/2017 7:09 AM

## 2017-06-17 NOTE — Progress Notes (Signed)
[facility] ROUNDING NOTE   Subjective:   Acute on chronic systolic heart failure with st Judes ICD NSTEMI in March 2018 and DES to LAD and RCA Complicated by cardiogenic shock and low output heart failure. He has required milrinone . The last Echo 8/18 showed EF of 20- 25 % and moderate MR. HeartMate 3 VAD placement 10/8. Baseline creatinine appears to be about variable but has been in the 2 range . It did improve with milrinone. Despite maximal diuretics he has failed to make urine and his creatinine continues to rise as does his volume status. He now is facing respiratory compromise with worsening edema on CXR. In addition He has a history of diabetes and crohns disease with a hemicolectomy in 2003 He has Factor V Leiden deficiency and history of recurrent DVT He is on coumadin and IV Heparin Catheter placed 10/11 CRRT started 10/11  ultrafiltration today at 100cc/hour appears to be tolerating  Weight down 13 lbs Poor urine output   Objective:  Vital signs in last 24 hours:  Temp:  [97.7 F (36.5 C)-98.6 F (37 C)] 98.4 F (36.9 C) (10/13 0800) Pulse Rate:  [29-126] 99 (10/13 0800) Resp:  [4-28] 20 (10/13 0800) BP: (77-181)/(40-166) 116/91 (10/13 0800) SpO2:  [91 %-100 %] 100 % (10/13 0800) FiO2 (%):  [36 %] 36 % (10/13 0732) Weight:  [213 lb 13.5 oz (97 kg)] 213 lb 13.5 oz (97 kg) (10/13 0500)  Weight change: -12 lb 12.6 oz (-5.8 kg) Filed Weights   06/15/17 0630 06/16/17 0615 06/17/17 0500  Weight: 220 lb 14.4 oz (100.2 kg) 226 lb 10.1 oz (102.8 kg) 213 lb 13.5 oz (97 kg)    Intake/Output: I/O last 3 completed shifts: In: 3058.5 [P.O.:387; I.V.:2154; Blood:367.5; IV Piggyback:150] Out: 5989 [Urine:50; Other:5919; Chest Tube:20]   Intake/Output this shift:  Total I/O In: 309.2 [P.O.:237; I.V.:72.2] Out: 266 [Urine:15; Other:221; Chest Tube:30]  CVS- irregular RR  JVP elevated   RS- CTA  Right IJ catheter  ABD- BS present soft non-distended LVAD  site clean  EXT- no edema   Basic Metabolic Panel:  Recent Labs Lab 06/13/17 1700 06/14/17 0310  06/15/17 0312 06/15/17 1610 06/15/17 1614 06/16/17 0200 06/16/17 1548 06/16/17 1600 06/17/17 0414  NA  --  130*  < > 130* 129* 129* 132* 135 136 137  136  K  --  4.9  < > 4.4 4.6 4.6 4.2 4.3 4.2 4.2  5.0  CL  --  97*  < > 95* 93* 93* 96* 99* 100* 99*  99*  CO2  --  21*  --  23 21*  --  22 24  --  26  25  GLUCOSE  --  220*  < > 101* 108* 108* 96 149* 147* 152*  148*  BUN  --  25*  < > 32* 40* 37* 29* 23* 23* 21*  20  CREATININE 2.08* 2.75*  < > 4.04* 4.85* 5.10* 3.71* 3.03* 2.80* 2.57*  2.56*  CALCIUM  --  8.4*  --  8.3* 8.2*  --  8.1* 8.0*  --  8.1*  8.0*  MG 2.4 2.4  --  2.4  --   --  2.4  --   --  2.6*  PHOS  --  5.9*  --  6.6* 7.1*  --  4.6 3.3  --  2.2*  < > = values in this interval not displayed.  Liver Function Tests:  Recent Labs Lab 06/13/17 0320 06/14/17 0310 06/15/17 1540  06/15/17 1610 06/16/17 0200 06/16/17 1548 06/17/17 0414  AST 89* 78* 52*  --  36  --  33  ALT 24 24 22   --  19  --  21  ALKPHOS 71 83 99  --  88  --  108  BILITOT 2.2* 2.5* 3.2*  --  3.0*  --  1.6*  PROT 5.7* 6.3* 6.5  --  6.3*  --  6.6  ALBUMIN 3.4* 3.2* 2.9* 2.8* 2.7* 2.7* 2.7*  2.6*   No results for input(s): LIPASE, AMYLASE in the last 168 hours. No results for input(s): AMMONIA in the last 168 hours.  CBC:  Recent Labs Lab 06/13/17 0320  06/14/17 0310  06/15/17 0312 06/15/17 1614 06/16/17 0200 06/16/17 1140 06/16/17 1600 06/17/17 0414  WBC 15.8*  < > 25.7*  --  24.5*  --  20.1* 16.7*  --  14.7*  NEUTROABS 13.4*  --  22.6*  --  21.7*  --  17.3*  --   --  11.5*  HGB 9.6*  < > 8.8*  < > 8.8* 9.5* 8.1* 8.5* 9.9* 8.6*  HCT 28.9*  < > 27.0*  < > 25.9* 28.0* 22.7* 24.7* 29.0* 25.2*  MCV 78.3  < > 79.2  --  77.8*  --  74.9* 76.2*  --  76.4*  PLT 123*  < > 125*  --  158  --  144* 164  --  219  < > = values in this interval not displayed.  Cardiac Enzymes:  Recent  Labs Lab 06/15/17 1218  CKTOTAL 309    BNP: Invalid input(s): POCBNP  CBG:  Recent Labs Lab 06/16/17 1608 06/16/17 1945 06/17/17 0001 06/17/17 0359 06/17/17 0814  GLUCAP 155* 204* 174* 142* 186*    Microbiology: Results for orders placed or performed during the hospital encounter of 06/07/17  Surgical pcr screen     Status: None   Collection Time: 06/07/17  5:52 PM  Result Value Ref Range Status   MRSA, PCR NEGATIVE NEGATIVE Final   Staphylococcus aureus NEGATIVE NEGATIVE Final    Comment: (NOTE) The Xpert SA Assay (FDA approved for NASAL specimens in patients 21 years of age and older), is one component of a comprehensive surveillance program. It is not intended to diagnose infection nor to guide or monitor treatment.   Surgical pcr screen     Status: None   Collection Time: 06/09/17  7:37 AM  Result Value Ref Range Status   MRSA, PCR NEGATIVE NEGATIVE Final   Staphylococcus aureus NEGATIVE NEGATIVE Final    Comment: (NOTE) The Xpert SA Assay (FDA approved for NASAL specimens in patients 49 years of age and older), is one component of a comprehensive surveillance program. It is not intended to diagnose infection nor to guide or monitor treatment.     Coagulation Studies:  Recent Labs  06/15/17 0312 06/16/17 0200 06/16/17 1554 06/17/17 0414  LABPROT 17.7* 21.9* 27.5* 27.5*  INR 1.47 1.93 2.59 2.59    Urinalysis:  Recent Labs  06/15/17 1615  COLORURINE AMBER*  LABSPEC 1.025  PHURINE 5.5  GLUCOSEU NEGATIVE  HGBUR MODERATE*  BILIRUBINUR NEGATIVE  KETONESUR NEGATIVE  PROTEINUR 30*  NITRITE NEGATIVE  LEUKOCYTESUR TRACE*      Imaging: US Renal  Result Date: 06/15/2017 CLINICAL DATA:  Acute renal failure EXAM: RENAL / URINARY TRACT ULTRASOUND COMPLETE COMPARISON:  02/11/2008 CT with contrast FINDINGS: Right Kidney: Length: 8.9 cm. Slight increased echogenicity suspicious for medical renal disease. Trace perinephric fluid noted. No acute  obstruction  or hydronephrosis. Renal atrophy noted. Left Kidney: Length: 10.5 cm. Limited visualization because of body habitus. No acute hydronephrosis. No significant focal abnormality. Bladder: Collapsed by Foley catheter. IMPRESSION: No acute finding or hydronephrosis. Limited exam because of body habitus. Electronically Signed   By: Jerilynn Mages.  Shick M.D.   On: 06/15/2017 16:46   Dg Chest Port 1 View  Result Date: 06/17/2017 CLINICAL DATA:  Left ventricular assistance device. EXAM: PORTABLE CHEST 1 VIEW COMPARISON:  Radiograph of June 16, 2017. FINDINGS: Stable cardiomegaly. Left ventricular assistance device is unchanged in position. Left-sided chest tube is noted without pneumothorax. Right subclavian and internal jugular catheters are unchanged in position. Left-sided pacemaker is unchanged in position. Mild central pulmonary vascular congestion is noted. Mild bibasilar subsegmental atelectasis is noted. Bony thorax is unremarkable. IMPRESSION: Stable support apparatus including left ventricular assist device. Stable cardiomegaly and central pulmonary vascular congestion. Stable bibasilar subsegmental atelectasis. Left-sided chest tube is noted without pneumothorax. Electronically Signed   By: Marijo Conception, M.D.   On: 06/17/2017 07:29   Dg Chest Port 1 View  Result Date: 06/16/2017 CLINICAL DATA:  51 year old male status post cardiac surgery with left ventricular assist device in place EXAM: PORTABLE CHEST 1 VIEW COMPARISON:  Prior chest x-ray obtained earlier today at 5:52 a.m. FINDINGS: Stable right IJ vascular sheath and right IJ central venous catheter with the tips overlying the superior cavoatrial junction. A right subclavian central venous catheter is also present. The tip of that catheter overlies the upper right atrium. A left ventricular assist device is visualized. The patient is status post median sternotomy. Stable position of left subclavian approach cardiac rhythm maintenance device  with leads projecting over the right atrium and right ventricle. Two right-sided chest tubes are in unchanged position. No evidence of pneumothorax. Stable appearance of the lungs compared earlier today with persistent very low inspiratory volumes, bibasilar atelectasis and bilateral layering pleural effusions. There is pulmonary vascular congestion without overt edema. A small amount of fluid is present within the fissures. No acute osseous abnormality. IMPRESSION: 1. Stable and satisfactory support apparatus. 2. No interval change in the appearance of the lungs compared earlier this morning. Persistent very low inspiratory volumes, bibasilar atelectasis, small bilateral layering pleural effusions and pulmonary vascular congestion. Electronically Signed   By: Jacqulynn Cadet M.D.   On: 06/16/2017 09:23   Dg Chest Port 1 View  Result Date: 06/16/2017 CLINICAL DATA:  Chest pain. Status post left ventricular assist device placement. EXAM: PORTABLE CHEST 1 VIEW COMPARISON:  06/15/2017. FINDINGS: Stable enlarged cardiac silhouette and LVAD. Stable left chest tubes. No pneumothorax. One right jugular catheter tip at the junction of the superior vena cava and right atrium. A second right jugular catheter with its tip in the upper right atrium. Add right subclavian catheter is also demonstrated with its tip in the mid to lower right atrium. Stable left basilar airspace opacity. Interval minimal right basilar airspace opacity. Unremarkable bones. IMPRESSION: 1. Stable left basilar atelectasis. 2. Interval minimal right basilar atelectasis or pneumonia. 3. Stable cardiomegaly. Electronically Signed   By: Claudie Revering M.D.   On: 06/16/2017 07:26   Dg Chest Port 1 View  Result Date: 06/15/2017 CLINICAL DATA:  Right-sided central line placement EXAM: PORTABLE CHEST 1 VIEW COMPARISON:  06/15/2017 FINDINGS: Right jugular central venous sheath with the tip projecting over the SVC. Right jugular central venous catheter  with the tip projecting over the cavoatrial junction. Right subclavian central venous catheter with the tip projecting over the SVC. Mild  bilateral interstitial prominence. No pneumothorax. Stable left-sided chest tubes. Trace left pleural fluid. Stable cardiomegaly. Left ventricular assist device is present. Dual lead cardiac pacemaker is present. IMPRESSION: 1. Right jugular central venous sheath with the tip projecting over the SVC. 2. Right jugular central venous catheter with the tip projecting over the cavoatrial junction. 3. Right subclavian central venous catheter with the tip projecting over the SVC. Electronically Signed   By: Kathreen Devoid   On: 06/15/2017 15:55   Dg Chest Port 1 View  Result Date: 06/15/2017 CLINICAL DATA:  CVL placement. EXAM: PORTABLE CHEST 1 VIEW COMPARISON:  Chest x-ray from same day at 05:51. FINDINGS: Interval placement of a right subclavian central venous catheter with the tip in the left brachiocephalic vein. Left chest wall AICD and LVAD device are unchanged in position. Left-sided chest tubes and mediastinal drain are also unchanged. Right internal jugular sheath again noted. Stable cardiomegaly. Low lung volumes. Unchanged perihilar opacities and left pleural effusion with adjacent left basilar opacity. Right basilar atelectasis appears unchanged. IMPRESSION: 1. Interval placement of a right subclavian central venous catheter with the tip projecting over the left brachiocephalic vein. Recommend repositioning. 2. Stable moderate interstitial pulmonary edema and left pleural effusion with adjacent left lung base airspace disease, atelectasis versus infiltrate. These results will be called to the ordering clinician or representative by the Radiologist Assistant, and communication documented in the PACS or zVision Dashboard. Electronically Signed   By: Titus Dubin M.D.   On: 06/15/2017 13:22     Medications:   . sodium chloride Stopped (06/14/17 1400)  . sodium  chloride Stopped (06/13/17 1100)  . sodium chloride 10 mL/hr at 06/17/17 0800  . amiodarone 30 mg/hr (06/17/17 0800)  . DOPamine 2 mcg/kg/min (06/17/17 0800)  . EPINEPHrine 4 mg in dextrose 5% 250 mL infusion (16 mcg/mL) Stopped (06/15/17 0846)  . lactated ringers Stopped (06/14/17 1400)  . lactated ringers Stopped (06/15/17 1901)  . milrinone 0.375 mcg/kg/min (06/17/17 0800)  . nitroGLYCERIN Stopped (06/13/17 1700)  . piperacillin-tazobactam 3.375 g (06/17/17 2229)  . dialysis replacement fluid (prismasate) 200 mL/hr at 06/16/17 1654  . dialysis replacement fluid (prismasate) 300 mL/hr at 06/17/17 0140  . dialysate (PRISMASATE) 2,000 mL/hr at 06/17/17 0618  . sodium chloride     . acetaminophen  1,000 mg Oral Q6H   Or  . acetaminophen (TYLENOL) oral liquid 160 mg/5 mL  1,000 mg Per Tube Q6H  . aspirin EC  325 mg Oral Daily   Or  . aspirin  324 mg Per Tube Daily   Or  . aspirin  300 mg Rectal Daily  . atorvastatin  80 mg Oral q1800  . bisacodyl  10 mg Oral Daily   Or  . bisacodyl  10 mg Rectal Daily  . chlorhexidine  15 mL Mouth Rinse BID  . Chlorhexidine Gluconate Cloth  6 each Topical Daily  . docusate sodium  200 mg Oral Daily  . feeding supplement (ENSURE ENLIVE)  237 mL Oral TID WC  . insulin aspart  0-24 Units Subcutaneous Q4H  . mouth rinse  15 mL Mouth Rinse q12n4p  . metoCLOPramide (REGLAN) injection  10 mg Intravenous Q6H  . mupirocin ointment   Nasal BID  . pantoprazole  40 mg Oral Daily  . sodium chloride flush  10-40 mL Intracatheter Q12H  . sodium chloride flush  3 mL Intravenous Q12H  . warfarin  2.5 mg Oral ONCE-1800  . Warfarin - Pharmacist Dosing Inpatient   Does not apply [phone]  sodium chloride, heparin, hydrALAZINE, midazolam, ondansetron (ZOFRAN) IV, oxyCODONE, promethazine, sodium chloride, sodium chloride flush, sodium chloride flush, traMADol  Assessment/ Plan:   Acute on chronic renal disease with some acute renal injury - this may be secondary  to ATN from an ischemic insult although the patient was receiving vancomycin Level 59 not toxic.  Patient was receiving high dose statin - CPK 309   renal ultrasound asymmetric renal size but no hydronephrosis. Still with minimal urine output   Volume  Patient tolerating fluid removal at this time   Acute on chronic heart failure Requiring both pressors and LVAD Placed 10/8  Anemia   Hb 8.6 iron stores low transfuse per cardiology will start darbapoietin  Bones stable at this point and will continue to follow metabolic parameters  Access - IJ line place 10/11   Electrolytes stable  Acid base stable  ID Vancomycin and Zosyn possible pneumonia  Atrial Fibrillation  Amiodarone and Warfarin  INR at goal   Diabetes controlled    LOS: 10 Navdeep Fessenden W @TODAY @8 :30 AM

## 2017-06-17 NOTE — Progress Notes (Signed)
ANTICOAGULATION CONSULT NOTE - Initial Consult  Pharmacy Consult for Coumadin (asked by Dr. Prescott Gum for pharmacy to dose starting 10/13) Indication: LVAD, h/o DVT Factor V Leiden heterozygosity  Allergies  Allergen Reactions  . Metformin And Related Diarrhea    Patient Measurements: Height: 5' 8"  (172.7 cm) Weight: 213 lb 13.5 oz (97 kg) IBW/kg (Calculated) : 68.4   Vital Signs: Temp: 98.4 F (36.9 C) (10/13 0800) Temp Source: Oral (10/13 0800) BP: 116/91 (10/13 0800) Pulse Rate: 99 (10/13 0800)  Labs:  Recent Labs  06/15/17 0312 06/15/17 1218  06/16/17 0200 06/16/17 0728 06/16/17 1140 06/16/17 1548 06/16/17 1554 06/16/17 1600 06/17/17 0414  HGB 8.8*  --   < > 8.1*  --  8.5*  --   --  9.9* 8.6*  HCT 25.9*  --   < > 22.7*  --  24.7*  --   --  29.0* 25.2*  PLT 158  --   --  144*  --  164  --   --   --  219  APTT 51*  --   < > 54*  --   --   --  90*  --  61*  LABPROT 17.7*  --   --  21.9*  --   --   --  27.5*  --  27.5*  INR 1.47  --   --  1.93  --   --   --  2.59  --  2.59  HEPARINUNFRC <0.10*  --   --   --  <0.10*  --   --   --   --   --   CREATININE 4.04*  --   < > 3.71*  --   --  3.03*  --  2.80* 2.57*  2.56*  CKTOTAL  --  309  --   --   --   --   --   --   --   --   < > = values in this interval not displayed.  Estimated Creatinine Clearance: 38 mL/min (A) (by C-G formula based on SCr of 2.57 mg/dL (H)).   Medical History: Past Medical History:  Diagnosis Date  . Angina   . ASCVD (arteriosclerotic cardiovascular disease)    , Anterior infarction 2005, LAD diagonal bifurcation intervention 03/2004  . Automatic implantable cardiac defibrillator -[employer]'s       . Benign neoplasm of colon   . CHF (congestive heart failure) ([place])   . Chronic systolic heart failure ([place])   . Coronary artery disease     Widely patent previously placed stents in the left anterior   . Crohn's disease ([place])   . 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  Dyspnea   . Gastroparesis   . GERD (gastroesophageal reflux disease)   . High cholesterol   . Hyperlipidemia   . Hypersomnolent    Previous diagnosis of narcolepsy  . Hypertension, essential   . Ischemic cardiomyopathy    Ejection fraction 15-20% catheterization 2010  . Type II or unspecified type diabetes mellitus without mention of complication, not stated as uncontrolled   . Unspecified gastritis and gastroduodenitis without mention of hemorrhage     Assessment: 51 yo male on chronic Coumadin, admitted for LVAD placement.  Bridged with IV Heparin until INR therapeutic yesterday, and heparin stopped.  INR remains at goal today.  No overt bleeding or complications noted.    Remains on CRRT.  No heparin currently added to CRRT circuit.  Goal of Therapy:  INR 2-3 Monitor platelets by anticoagulation protocol: Yes   Plan:  1. Coumadin 2.5 mg x 1 tonight. 2. Daily PT/INR. 3. May need to add heparin back to CRRT circuit since no longer on systemic heparin.  Uvaldo Rising, BCPS  Clinical Pharmacist Pager [phone]  06/17/2017 8:46 AM

## 2017-06-18 ENCOUNTER — Inpatient Hospital Stay (HOSPITAL_COMMUNITY): Payer: BLUE CROSS/BLUE SHIELD

## 2017-06-18 DIAGNOSIS — I5043 Acute on chronic combined systolic (congestive) and diastolic (congestive) heart failure: Secondary | ICD-10-CM

## 2017-06-18 DIAGNOSIS — Z954 Presence of other heart-valve replacement: Secondary | ICD-10-CM

## 2017-06-18 LAB — PROTIME-INR
INR: 1.99
PROTHROMBIN TIME: 22.4 s — AB (ref 11.4–15.2)

## 2017-06-18 LAB — COMPREHENSIVE METABOLIC PANEL
ALT: 19 U/L (ref 17–63)
AST: 33 U/L (ref 15–41)
Albumin: 2.7 g/dL — ABNORMAL LOW (ref 3.5–5.0)
Alkaline Phosphatase: 132 U/L — ABNORMAL HIGH (ref 38–126)
Anion gap: 14 (ref 5–15)
BILIRUBIN TOTAL: 1.3 mg/dL — AB (ref 0.3–1.2)
BUN: 15 mg/dL (ref 6–20)
CHLORIDE: 98 mmol/L — AB (ref 101–111)
CO2: 25 mmol/L (ref 22–32)
CREATININE: 2.44 mg/dL — AB (ref 0.61–1.24)
Calcium: 8.2 mg/dL — ABNORMAL LOW (ref 8.9–10.3)
GFR, EST AFRICAN AMERICAN: 33 mL/min — AB (ref >60)
GFR, EST NON AFRICAN AMERICAN: 29 mL/min — AB (ref >60)
Glucose, Bld: 137 mg/dL — ABNORMAL HIGH (ref 65–99)
POTASSIUM: 3.6 mmol/L (ref 3.5–5.1)
Sodium: 137 mmol/L (ref 135–145)
TOTAL PROTEIN: 7 g/dL (ref 6.5–8.1)

## 2017-06-18 LAB — CBC WITH DIFFERENTIAL/PLATELET
Basophils Absolute: 0 10*3/uL (ref 0.0–0.1)
Basophils Relative: 0 %
Eosinophils Absolute: 0.4 10*3/uL (ref 0.0–0.7)
Eosinophils Relative: 2 %
HCT: 26.5 % — ABNORMAL LOW (ref 39.0–52.0)
Hemoglobin: 8.8 g/dL — ABNORMAL LOW (ref 13.0–17.0)
Lymphocytes Relative: 9 %
Lymphs Abs: 1.4 10*3/uL (ref 0.7–4.0)
MCH: 25.9 pg — ABNORMAL LOW (ref 26.0–34.0)
MCHC: 33.2 g/dL (ref 30.0–36.0)
MCV: 77.9 fL — ABNORMAL LOW (ref 78.0–100.0)
Monocytes Absolute: 2.8 10*3/uL — ABNORMAL HIGH (ref 0.1–1.0)
Monocytes Relative: 19 %
Neutro Abs: 10.3 10*3/uL — ABNORMAL HIGH (ref 1.7–7.7)
Neutrophils Relative %: 70 %
Platelets: 254 10*3/uL (ref 150–400)
RBC: 3.4 MIL/uL — ABNORMAL LOW (ref 4.22–5.81)
RDW: 16.6 % — ABNORMAL HIGH (ref 11.5–15.5)
WBC: 14.9 10*3/uL — ABNORMAL HIGH (ref 4.0–10.5)

## 2017-06-18 LAB — COOXEMETRY PANEL
Carboxyhemoglobin: 1 % (ref 0.5–1.5)
Carboxyhemoglobin: 0.9 % (ref 0.5–1.5)
Methemoglobin: 1.4 % (ref 0.0–1.5)
Methemoglobin: 1.5 % (ref 0.0–1.5)
O2 Saturation: 66.2 %
O2 Saturation: 65.6 %
Total hemoglobin: 9.6 g/dL — ABNORMAL LOW (ref 12.0–16.0)
Total hemoglobin: 9 g/dL — ABNORMAL LOW (ref 12.0–16.0)

## 2017-06-18 LAB — POCT I-STAT, CHEM 8
BUN: 14 mg/dL (ref 6–20)
CALCIUM ION: 1.07 mmol/L — AB (ref 1.15–1.40)
CREATININE: 2.4 mg/dL — AB (ref 0.61–1.24)
Chloride: 100 mmol/L — ABNORMAL LOW (ref 101–111)
Glucose, Bld: 120 mg/dL — ABNORMAL HIGH (ref 65–99)
HEMATOCRIT: 27 % — AB (ref 39.0–52.0)
HEMOGLOBIN: 9.2 g/dL — AB (ref 13.0–17.0)
Potassium: 4.4 mmol/L (ref 3.5–5.1)
Sodium: 137 mmol/L (ref 135–145)
TCO2: 28 mmol/L (ref 22–32)

## 2017-06-18 LAB — GLUCOSE, CAPILLARY
GLUCOSE-CAPILLARY: 120 mg/dL — AB (ref 65–99)
GLUCOSE-CAPILLARY: 179 mg/dL — AB (ref 65–99)
Glucose-Capillary: 134 mg/dL — ABNORMAL HIGH (ref 65–99)
Glucose-Capillary: 218 mg/dL — ABNORMAL HIGH (ref 65–99)
Glucose-Capillary: 115 mg/dL — ABNORMAL HIGH (ref 65–99)

## 2017-06-18 LAB — VANCOMYCIN, RANDOM: VANCOMYCIN RM: 23

## 2017-06-18 LAB — RENAL FUNCTION PANEL
ALBUMIN: 2.5 g/dL — AB (ref 3.5–5.0)
Anion gap: 9 (ref 5–15)
BUN: 12 mg/dL (ref 6–20)
CALCIUM: 7.9 mg/dL — AB (ref 8.9–10.3)
CO2: 27 mmol/L (ref 22–32)
CREATININE: 2.44 mg/dL — AB (ref 0.61–1.24)
Chloride: 100 mmol/L — ABNORMAL LOW (ref 101–111)
GFR calc Af Amer: 33 mL/min — ABNORMAL LOW (ref >60)
GFR, EST NON AFRICAN AMERICAN: 29 mL/min — AB (ref >60)
GLUCOSE: 116 mg/dL — AB (ref 65–99)
PHOSPHORUS: 2 mg/dL — AB (ref 2.5–4.6)
POTASSIUM: 4.4 mmol/L (ref 3.5–5.1)
SODIUM: 136 mmol/L (ref 135–145)

## 2017-06-18 LAB — PHOSPHORUS: Phosphorus: 1.5 mg/dL — ABNORMAL LOW (ref 2.5–4.6)

## 2017-06-18 LAB — LACTATE DEHYDROGENASE: LDH: 385 U/L — ABNORMAL HIGH (ref 98–192)

## 2017-06-18 LAB — MAGNESIUM: MAGNESIUM: 2.4 mg/dL (ref 1.7–2.4)

## 2017-06-18 MED ORDER — LORAZEPAM 2 MG/ML IJ SOLN
0.5000 mg | Freq: Three times a day (TID) | INTRAMUSCULAR | Status: DC | PRN
  Administered 2017-06-18 – 2017-06-23 (×4): 0.5 mg via INTRAVENOUS
  Filled 2017-06-18 (×4): qty 1

## 2017-06-18 MED ORDER — PRISMASOL BGK 4/2.5 32-4-2.5 MEQ/L IV SOLN
INTRAVENOUS | Status: DC
  Administered 2017-06-18 – 2017-06-19 (×9): via INTRAVENOUS_CENTRAL
  Filled 2017-06-18 (×21): qty 5000

## 2017-06-18 MED ORDER — AMIODARONE HCL 200 MG PO TABS
400.0000 mg | ORAL_TABLET | Freq: Two times a day (BID) | ORAL | Status: DC
  Administered 2017-06-18 – 2017-06-25 (×16): 400 mg via ORAL
  Filled 2017-06-18 (×16): qty 2

## 2017-06-18 MED ORDER — VANCOMYCIN HCL IN DEXTROSE 1-5 GM/200ML-% IV SOLN
1000.0000 mg | Freq: Once | INTRAVENOUS | Status: AC
  Administered 2017-06-18: 1000 mg via INTRAVENOUS
  Filled 2017-06-18: qty 200

## 2017-06-18 MED ORDER — PRISMASOL BGK 0/2.5 32-2.5 MEQ/L IV SOLN
INTRAVENOUS | Status: DC
  Filled 2017-06-18 (×3): qty 5000

## 2017-06-18 MED ORDER — POTASSIUM CHLORIDE 10 MEQ/50ML IV SOLN
10.0000 meq | INTRAVENOUS | Status: AC
  Administered 2017-06-18 (×4): 10 meq via INTRAVENOUS
  Filled 2017-06-18 (×4): qty 50

## 2017-06-18 MED ORDER — AMIODARONE IV BOLUS ONLY 150 MG/100ML
150.0000 mg | Freq: Once | INTRAVENOUS | Status: AC
  Administered 2017-06-18: 150 mg via INTRAVENOUS
  Filled 2017-06-18: qty 100

## 2017-06-18 MED ORDER — WARFARIN SODIUM 5 MG PO TABS
5.0000 mg | ORAL_TABLET | Freq: Once | ORAL | Status: AC
Start: 2017-06-18 — End: 2017-06-18
  Administered 2017-06-18: 5 mg via ORAL
  Filled 2017-06-18: qty 1

## 2017-06-18 MED ORDER — POTASSIUM PHOSPHATES 15 MMOLE/5ML IV SOLN
10.0000 mmol | Freq: Once | INTRAVENOUS | Status: AC
  Administered 2017-06-18: 10 mmol via INTRAVENOUS
  Filled 2017-06-18: qty 3.33

## 2017-06-18 NOTE — Progress Notes (Signed)
[facility] ROUNDING NOTE   Subjective:   Acute on chronic systolic heart failure with st Judes ICD NSTEMI in March 2018 and DES to LAD and RCA Complicated by cardiogenic shock and low output heart failure. He has required milrinone . The last Echo 8/18 showed EF of 20- 25 % and moderate MR. HeartMate 3 VAD placement 10/8. Baseline creatinine appears to be about variable but has been in the 2 range . It did improve with milrinone. Despite maximal diuretics he has failed to make urine and his creatinine continues to rise as does his volume status. He now is facing respiratory compromise with worsening edema on CXR. In addition He has a history of diabetes and crohns disease with a hemicolectomy in 2003 He has Factor V Leiden deficiency and history of recurrent DVT He is on coumadin and IV Heparin Catheter placed 10/11 CRRT started 10/11  ultrafiltration today at 100cc/hour appears to be tolerating  Weight down 19 lbs Poor urine output  Discussed with Dr Aundra Dubin today and he would like [place] to continue aggressive fluid removal today and discontinue the CRRT in AM to see if the patient renal function will improve   Objective:  Vital signs in last 24 hours:  Temp:  [98 F (36.7 C)-99.5 F (37.5 C)] 98 F (36.7 C) (10/14 0831) Pulse Rate:  [94-116] 98 (10/14 1000) Resp:  [13-29] 20 (10/14 1000) BP: (90-136)/(73-102) 103/83 (10/14 1000) SpO2:  [99 %-100 %] 100 % (10/14 1000) Weight:  [207 lb 7.3 oz (94.1 kg)] 207 lb 7.3 oz (94.1 kg) (10/14 0500)  Weight change: -6 lb 6.3 oz (-2.9 kg) Filed Weights   06/16/17 0615 06/17/17 0500 06/18/17 0500  Weight: 226 lb 10.1 oz (102.8 kg) 213 lb 13.5 oz (97 kg) 207 lb 7.3 oz (94.1 kg)    Intake/Output: I/O last 3 completed shifts: In: 3785.3 [P.O.:1724; I.V.:1761.3; IV Piggyback:300] Out: 7867 [Urine:121; Emesis/NG output:100; EHMCN:4709; Chest Tube:150]   Intake/Output this shift:  Total I/O In: 776.6 [P.O.:237; I.V.:113.6; IV  Piggyback:426] Out: 6283 [Urine:17; Emesis/NG output:100; Other:923]   CVS- irregular RR JVP elevated  RS- CTA Right IJ catheter  ABD- BS present soft non-distended LVAD site clean  EXT- no edema   Basic Metabolic Panel:  Recent Labs Lab 06/14/17 0310  06/15/17 0312  06/16/17 0200 06/16/17 1548 06/16/17 1600 06/17/17 0414 06/17/17 1526 06/17/17 1537 06/18/17 0315  NA 130*  < > 130*  < > 132* 135 136 137  136 136 133* 137  K 4.9  < > 4.4  < > 4.2 4.3 4.2 4.2  5.0 5.7* 5.7* 3.6  CL 97*  < > 95*  < > 96* 99* 100* 99*  99* 99* 99* 98*  CO2 21*  --  23  < > 22 24  --  26  25  --  26 25  GLUCOSE 220*  < > 101*  < > 96 149* 147* 152*  148* 188* 175* 137*  BUN 25*  < > 32*  < > 29* 23* 23* 21*  20 26* 20 15  CREATININE 2.75*  < > 4.04*  < > 3.71* 3.03* 2.80* 2.57*  2.56* 2.50* 2.53* 2.44*  CALCIUM 8.4*  --  8.3*  < > 8.1* 8.0*  --  8.1*  8.0*  --  7.9* 8.2*  MG 2.4  --  2.4  --  2.4  --   --  2.6*  --   --  2.4  PHOS 5.9*  --  6.6*  < > 4.6 3.3  --  2.2*  --  1.8* 1.5*  < > = values in this interval not displayed.  Liver Function Tests:  Recent Labs Lab 06/14/17 0310 06/15/17 0312  06/16/17 0200 06/16/17 1548 06/17/17 0414 06/17/17 1537 06/18/17 0315  AST 78* 52*  --  36  --  33  --  33  ALT 24 22  --  19  --  21  --  19  ALKPHOS 83 99  --  88  --  108  --  132*  BILITOT 2.5* 3.2*  --  3.0*  --  1.6*  --  1.3*  PROT 6.3* 6.5  --  6.3*  --  6.6  --  7.0  ALBUMIN 3.2* 2.9*  < > 2.7* 2.7* 2.7*  2.6* 2.7* 2.7*  < > = values in this interval not displayed. No results for input(s): LIPASE, AMYLASE in the last 168 hours. No results for input(s): AMMONIA in the last 168 hours.  CBC:  Recent Labs Lab 06/14/17 0310  06/15/17 0312  06/16/17 0200 06/16/17 1140 06/16/17 1600 06/17/17 0414 06/17/17 1526 06/18/17 0315  WBC 25.7*  --  24.5*  --  20.1* 16.7*  --  14.7*  --  14.9*  NEUTROABS 22.6*  --  21.7*  --  17.3*  --   --  11.5*  --  10.3*  HGB 8.8*  <  > 8.8*  < > 8.1* 8.5* 9.9* 8.6* 8.8* 8.8*  HCT 27.0*  < > 25.9*  < > 22.7* 24.7* 29.0* 25.2* 26.0* 26.5*  MCV 79.2  --  77.8*  --  74.9* 76.2*  --  76.4*  --  77.9*  PLT 125*  --  158  --  144* 164  --  219  --  254  < > = values in this interval not displayed.  Cardiac Enzymes:  Recent Labs Lab 06/15/17 1218  CKTOTAL 309    BNP: Invalid input(s): POCBNP  CBG:  Recent Labs Lab 06/17/17 0814 06/17/17 1215 06/17/17 2014 06/18/17 0002 06/18/17 0826  GLUCAP 186* 224* 164* 28* 30*    Microbiology: Results for orders placed or performed during the hospital encounter of 06/07/17  Surgical pcr screen     Status: None   Collection Time: 06/07/17  5:52 PM  Result Value Ref Range Status   MRSA, PCR NEGATIVE NEGATIVE Final   Staphylococcus aureus NEGATIVE NEGATIVE Final    Comment: (NOTE) The Xpert SA Assay (FDA approved for NASAL specimens in patients 44 years of age and older), is one component of a comprehensive surveillance program. It is not intended to diagnose infection nor to guide or monitor treatment.   Surgical pcr screen     Status: None   Collection Time: 06/09/17  7:37 AM  Result Value Ref Range Status   MRSA, PCR NEGATIVE NEGATIVE Final   Staphylococcus aureus NEGATIVE NEGATIVE Final    Comment: (NOTE) The Xpert SA Assay (FDA approved for NASAL specimens in patients 63 years of age and older), is one component of a comprehensive surveillance program. It is not intended to diagnose infection nor to guide or monitor treatment.     Coagulation Studies:  Recent Labs  06/16/17 0200 06/16/17 1554 06/17/17 0414 06/18/17 0315  LABPROT 21.9* 27.5* 27.5* 22.4*  INR 1.93 2.59 2.59 1.99    Urinalysis:  Recent Labs  06/15/17 1615  COLORURINE AMBER*  LABSPEC 1.025  PHURINE 5.5  GLUCOSEU NEGATIVE  HGBUR MODERATE*  BILIRUBINUR  NEGATIVE  KETONESUR NEGATIVE  PROTEINUR 30*  NITRITE NEGATIVE  LEUKOCYTESUR TRACE*      Imaging: Dg Chest Port 1  View  Result Date: 06/18/2017 CLINICAL DATA:  Chest tube, left ventricular assist device. EXAM: PORTABLE CHEST 1 VIEW COMPARISON:  Radiograph of June 17, 2017. FINDINGS: Stable cardiomediastinal silhouette. Left ventricular assistance device is unchanged in position. Left-sided pacemaker is also unchanged. Stable position of right internal jugular and subclavian catheters. No pneumothorax is noted. Left-sided chest tube is unchanged in position. Stable bibasilar subsegmental atelectasis. Bony thorax is unremarkable. IMPRESSION: Stable support apparatus including left ventricular assistance device. Left-sided chest tube is unchanged without evidence pneumothorax. Stable bibasilar subsegmental listhesis. Electronically Signed   By: Marijo Conception, M.D.   On: 06/18/2017 07:12   Dg Chest Port 1 View  Result Date: 06/17/2017 CLINICAL DATA:  Left ventricular assistance device. EXAM: PORTABLE CHEST 1 VIEW COMPARISON:  Radiograph of June 16, 2017. FINDINGS: Stable cardiomegaly. Left ventricular assistance device is unchanged in position. Left-sided chest tube is noted without pneumothorax. Right subclavian and internal jugular catheters are unchanged in position. Left-sided pacemaker is unchanged in position. Mild central pulmonary vascular congestion is noted. Mild bibasilar subsegmental atelectasis is noted. Bony thorax is unremarkable. IMPRESSION: Stable support apparatus including left ventricular assist device. Stable cardiomegaly and central pulmonary vascular congestion. Stable bibasilar subsegmental atelectasis. Left-sided chest tube is noted without pneumothorax. Electronically Signed   By: Marijo Conception, M.D.   On: 06/17/2017 07:29     Medications:   . sodium chloride Stopped (06/14/17 1400)  . sodium chloride Stopped (06/13/17 1100)  . sodium chloride 10 mL/hr at 06/18/17 1000  . lactated ringers Stopped (06/14/17 1400)  . lactated ringers Stopped (06/15/17 1901)  . milrinone 0.375  mcg/kg/min (06/18/17 1000)  . nitroGLYCERIN Stopped (06/13/17 1700)  . piperacillin-tazobactam Stopped (06/18/17 7026)  . potassium PHOSPHATE in dextrose 5% IVPB 250 mL 10 mmol (06/18/17 0800)  . dialysis replacement fluid (prismasate) 200 mL/hr at 06/17/17 2202  . dialysis replacement fluid (prismasate) 300 mL/hr at 06/17/17 1802  . dialysate (PRISMASATE) 2,000 mL/hr at 06/18/17 0950  . sodium chloride     . amiodarone  400 mg Oral BID  . aspirin EC  325 mg Oral Daily   Or  . aspirin  324 mg Per Tube Daily   Or  . aspirin  300 mg Rectal Daily  . atorvastatin  80 mg Oral q1800  . bisacodyl  10 mg Oral Daily   Or  . bisacodyl  10 mg Rectal Daily  . chlorhexidine  15 mL Mouth Rinse BID  . Chlorhexidine Gluconate Cloth  6 each Topical Daily  . cloNIDine  0.2 mg Transdermal Weekly  . darbepoetin (ARANESP) injection - DIALYSIS  100 mcg Intravenous Q Sat-HD  . docusate sodium  200 mg Oral Daily  . feeding supplement (ENSURE ENLIVE)  237 mL Oral TID WC  . insulin aspart  0-24 Units Subcutaneous Q4H  . mouth rinse  15 mL Mouth Rinse q12n4p  . metoCLOPramide (REGLAN) injection  10 mg Intravenous Q6H  . mupirocin ointment   Nasal BID  . pantoprazole  40 mg Oral Daily  . sodium chloride flush  10-40 mL Intracatheter Q12H  . sodium chloride flush  3 mL Intravenous Q12H  . warfarin  5 mg Oral ONCE-1800  . Warfarin - Pharmacist Dosing Inpatient   Does not apply q1800   sodium chloride, heparin, hydrALAZINE, LORazepam, ondansetron (ZOFRAN) IV, oxyCODONE, promethazine, sodium chloride, sodium chloride flush,  sodium chloride flush, traMADol  Assessment/ Plan:   Acute on chronic renal disease with some acute renal injury - this may be secondary to ATN from an ischemic insult although the patient was receivingvancomycin Level 23 not toxic. Patient was receiving high dose statin - CPK 309renal ultrasound asymmetric renal size but no hydronephrosis. Still with minimal urine output   Volume   Patient tolerating fluid removal at this time   Acute on chronic heart failure Requiring both pressors and LVAD Placed 10/8  Anemia   Hb 8.8        iron stores low transfuse per cardiology will start darbapoietin  Bones stable at this point and will continue to follow metabolic parameters   Phosphorus low will replete with 20Meq Phos  Access - IJ line place 10/11   Electrolytes stable  Acid base stable  ID Vancomycin and Zosyn possible pneumonia  Atrial Fibrillation  Amiodarone and Warfarin  INR at goal   Heparin discontinued   Diabetes controlled    LOS: 11 Lavona Norsworthy W @TODAY @10 :58 AM

## 2017-06-18 NOTE — Progress Notes (Signed)
Pharmacy Antibiotic Note  [name] is a 51 y.o. male admitted on 06/07/2017 with surgical prophylaxis.  Pharmacy has been consulted for vancomycin dosing x 48 hrs post-op. Renal function worsening acutely, now on CRRT.  WBC up - per MD add Zosyn and continue vancomycin for now.   Vancomycin random level this AM = 23.  Last vancomycin dose given 10/10.  Plan: -Vancomycin 1g x 1 now. -Recheck random vancomycin level with AM labs. -Follow renal plans -Continue Zosyn 3.375g IV EI q8h   Height: 5' 8"  (172.7 cm) Weight: 207 lb 7.3 oz (94.1 kg) IBW/kg (Calculated) : 68.4  Temp (24hrs), Avg:98.6 F (37 C), Min:98.1 F (36.7 C), Max:99.5 F (37.5 C)   Recent Labs Lab 06/15/17 0312  06/16/17 0200 06/16/17 1140  06/16/17 1600 06/17/17 0414 06/17/17 1526 06/17/17 1537 06/18/17 0315  WBC 24.5*  --  20.1* 16.7*  --   --  14.7*  --   --  14.9*  CREATININE 4.04*  < > 3.71*  --   < > 2.80* 2.57*  2.56* 2.50* 2.53* 2.44*  VANCORANDOM  --   < >  --   --   --   --  36  --   --  23  < > = values in this interval not displayed.  Estimated Creatinine Clearance: 39.4 mL/min (A) (by C-G formula based on SCr of 2.44 mg/dL (H)).    Allergies  Allergen Reactions  . Metformin And Related Diarrhea    Antimicrobials this admission: Vancomycin 10/8 >>  Cefuroxime 10/8 >>10/10 Zosyn 10/10 >>  Dose adjustments this admission: 10/10: SCr up> vancomycin to q12h 10/11: SCr up > hold vancomycin 10/11 VR 59 > hold vancomycin 10/13 VR 36 > continue to hold vancomycin 10/14 VR 23 > redose w/ 1g today   Microbiology results: 10/5 MRSA PCR: neg  Thank you for allowing pharmacy to be a part of this patient's care.  Uvaldo Rising, BCPS  Clinical Pharmacist Pager [phone]  06/18/2017 7:30 AM

## 2017-06-18 NOTE — Progress Notes (Signed)
Patient HR 125-148 and regular. Dr. Prescott Gum notified. New order for 150 mg bolus and for second dose of amiodarone PO to be given now. Will notify oncoming RN not to give tonight's dose.

## 2017-06-18 NOTE — Plan of Care (Signed)
Problem: Fluid Volume: Goal: Risk for excess fluid volume will decrease Outcome: Progressing Continuing fluid removal via CRRT overnight.  Problem: Respiratory: Goal: Ability to maintain adequate ventilation will improve Outcome: Progressing Practicing IS with patient, encouraging spontaneous use by patient.

## 2017-06-18 NOTE — Plan of Care (Signed)
Problem: Education: Goal: Knowledge of the prescribed therapeutic regimen will improve Outcome: Progressing Son in Forensic psychologist at the bedside 10/13. Drive line dressing change instructions reviewed and then he was able to watch RN as she did the dressing change. Was given sterile gloves to practice in preparation for performing dressing changes. Will continue to work with family.

## 2017-06-18 NOTE — Progress Notes (Signed)
Patient ID: [name], male   DOB: [birth year], 51 y.o.   MRN: [id number] Patient ID: [name], male   DOB: April 18, 1966, 51 y.o.   MRN: [id number]   Advanced Heart Failure VAD Team Note  Subjective:    Admitted 10/3 for RHC/Swan implantation and optimization prior to LVAD.  HeartMate 3 VAD implanted 10/8.  CVVH begun 10/11  CVVH continues, net negative -2865 yesterday and weight down.  CVP 12-14 today.  Co-ox 66%.  Dopamine decreased to 1 and milrinone 0.375.  MAP in 80s-90s.    He is in NSR today with amiodarone 30 mg/hr.   Breathing improved, no complaints. UOP only about 86 cc for the day.  He was able to walk farther and felt stronger yesterday.   LVAD INTERROGATION:  HeartMate 3:  Flow 4.1 liters/min, speed 5400 rpm, power 3.9, PI 3.5.  1 PI events /24 hrs   Objective:    Vital Signs:   Temp:  [98.1 F (36.7 C)-99.5 F (37.5 C)] 98.5 F (36.9 C) (10/14 0400) Pulse Rate:  [94-116] 109 (10/14 0746) Resp:  [13-29] 20 (10/14 0746) BP: (90-136)/(67-102) 122/84 (10/14 0746) SpO2:  [99 %-100 %] 100 % (10/14 0746) Weight:  [207 lb 7.3 oz (94.1 kg)] 207 lb 7.3 oz (94.1 kg) (10/14 0500) Last BM Date: 06/17/17 Mean arterial Pressure 80s-90s  Intake/Output:   Intake/Output Summary (Last 24 hours) at 06/18/17 0754 Last data filed at 06/18/17 0726  Gross per 24 hour  Intake          3077.93 ml  Output             5947 ml  Net         -2869.07 ml     Physical Exam: CVP 12-14 GENERAL: Well appearing this am. NAD.  HEENT: Normal. NECK: Supple, JVP 10-12 cm. Carotids OK.  CARDIAC:  Mechanical heart sounds with LVAD hum present.  LUNGS:  CTAB, normal effort.  ABDOMEN:  NT, ND, no HSM. No bruits or masses. +BS  LVAD exit site: Well-healed and incorporated. Dressing dry and intact. No erythema or drainage. Stabilization device present and accurately applied. Driveline dressing changed daily per sterile technique. EXTREMITIES:  Warm and dry. No cyanosis, clubbing,  rash, or edema.  NEUROLOGIC:  Alert & oriented x 3. Cranial nerves grossly intact. Moves all 4 extremities w/o difficulty. Affect pleasant      Telemetry   Sinus rate 100s (personally reviewed) with occasional PVCs  Labs   Basic Metabolic Panel:  Recent Labs Lab 06/14/17 0310  06/15/17 0312  06/16/17 0200 06/16/17 1548 06/16/17 1600 06/17/17 0414 06/17/17 1526 06/17/17 1537 06/18/17 0315  NA 130*  < > 130*  < > 132* 135 136 137  136 136 133* 137  K 4.9  < > 4.4  < > 4.2 4.3 4.2 4.2  5.0 5.7* 5.7* 3.6  CL 97*  < > 95*  < > 96* 99* 100* 99*  99* 99* 99* 98*  CO2 21*  --  23  < > 22 24  --  26  25  --  26 25  GLUCOSE 220*  < > 101*  < > 96 149* 147* 152*  148* 188* 175* 137*  BUN 25*  < > 32*  < > 29* 23* 23* 21*  20 26* 20 15  CREATININE 2.75*  < > 4.04*  < > 3.71* 3.03* 2.80* 2.57*  2.56* 2.50* 2.53* 2.44*  CALCIUM 8.4*  --  8.3*  < >  8.1* 8.0*  --  8.1*  8.0*  --  7.9* 8.2*  MG 2.4  --  2.4  --  2.4  --   --  2.6*  --   --  2.4  PHOS 5.9*  --  6.6*  < > 4.6 3.3  --  2.2*  --  1.8* 1.5*  < > = values in this interval not displayed.  Liver Function Tests:  Recent Labs Lab 06/14/17 0310 06/15/17 0312  06/16/17 0200 06/16/17 1548 06/17/17 0414 06/17/17 1537 06/18/17 0315  AST 78* 52*  --  36  --  33  --  33  ALT 24 22  --  19  --  21  --  19  ALKPHOS 83 99  --  88  --  108  --  132*  BILITOT 2.5* 3.2*  --  3.0*  --  1.6*  --  1.3*  PROT 6.3* 6.5  --  6.3*  --  6.6  --  7.0  ALBUMIN 3.2* 2.9*  < > 2.7* 2.7* 2.7*  2.6* 2.7* 2.7*  < > = values in this interval not displayed. No results for input(s): LIPASE, AMYLASE in the last 168 hours. No results for input(s): AMMONIA in the last 168 hours.  CBC:  Recent Labs Lab 06/14/17 0310  06/15/17 0312  06/16/17 0200 06/16/17 1140 06/16/17 1600 06/17/17 0414 06/17/17 1526 06/18/17 0315  WBC 25.7*  --  24.5*  --  20.1* 16.7*  --  14.7*  --  14.9*  NEUTROABS 22.6*  --  21.7*  --  17.3*  --   --  11.5*  --   10.3*  HGB 8.8*  < > 8.8*  < > 8.1* 8.5* 9.9* 8.6* 8.8* 8.8*  HCT 27.0*  < > 25.9*  < > 22.7* 24.7* 29.0* 25.2* 26.0* 26.5*  MCV 79.2  --  77.8*  --  74.9* 76.2*  --  76.4*  --  77.9*  PLT 125*  --  158  --  144* 164  --  219  --  254  < > = values in this interval not displayed.  INR:  Recent Labs Lab 06/15/17 0312 06/16/17 0200 06/16/17 1554 06/17/17 0414 06/18/17 0315  INR 1.47 1.93 2.59 2.59 1.99    Other results:  EKG:    Imaging   Dg Chest Port 1 View  Result Date: 06/18/2017 CLINICAL DATA:  Chest tube, left ventricular assist device. EXAM: PORTABLE CHEST 1 VIEW COMPARISON:  Radiograph of June 17, 2017. FINDINGS: Stable cardiomediastinal silhouette. Left ventricular assistance device is unchanged in position. Left-sided pacemaker is also unchanged. Stable position of right internal jugular and subclavian catheters. No pneumothorax is noted. Left-sided chest tube is unchanged in position. Stable bibasilar subsegmental atelectasis. Bony thorax is unremarkable. IMPRESSION: Stable support apparatus including left ventricular assistance device. Left-sided chest tube is unchanged without evidence pneumothorax. Stable bibasilar subsegmental listhesis. Electronically Signed   By: Marijo Conception, M.D.   On: 06/18/2017 07:12   Dg Chest Port 1 View  Result Date: 06/17/2017 CLINICAL DATA:  Left ventricular assistance device. EXAM: PORTABLE CHEST 1 VIEW COMPARISON:  Radiograph of June 16, 2017. FINDINGS: Stable cardiomegaly. Left ventricular assistance device is unchanged in position. Left-sided chest tube is noted without pneumothorax. Right subclavian and internal jugular catheters are unchanged in position. Left-sided pacemaker is unchanged in position. Mild central pulmonary vascular congestion is noted. Mild bibasilar subsegmental atelectasis is noted. Bony thorax is unremarkable. IMPRESSION: Stable support apparatus including left  ventricular assist device. Stable cardiomegaly  and central pulmonary vascular congestion. Stable bibasilar subsegmental atelectasis. Left-sided chest tube is noted without pneumothorax. Electronically Signed   By: Marijo Conception, M.D.   On: 06/17/2017 07:29   Dg Chest Port 1 View  Result Date: 06/16/2017 CLINICAL DATA:  51 year old male status post cardiac surgery with left ventricular assist device in place EXAM: PORTABLE CHEST 1 VIEW COMPARISON:  Prior chest x-ray obtained earlier today at 5:52 a.m. FINDINGS: Stable right IJ vascular sheath and right IJ central venous catheter with the tips overlying the superior cavoatrial junction. A right subclavian central venous catheter is also present. The tip of that catheter overlies the upper right atrium. A left ventricular assist device is visualized. The patient is status post median sternotomy. Stable position of left subclavian approach cardiac rhythm maintenance device with leads projecting over the right atrium and right ventricle. Two right-sided chest tubes are in unchanged position. No evidence of pneumothorax. Stable appearance of the lungs compared earlier today with persistent very low inspiratory volumes, bibasilar atelectasis and bilateral layering pleural effusions. There is pulmonary vascular congestion without overt edema. A small amount of fluid is present within the fissures. No acute osseous abnormality. IMPRESSION: 1. Stable and satisfactory support apparatus. 2. No interval change in the appearance of the lungs compared earlier this morning. Persistent very low inspiratory volumes, bibasilar atelectasis, small bilateral layering pleural effusions and pulmonary vascular congestion. Electronically Signed   By: Jacqulynn Cadet M.D.   On: 06/16/2017 09:23     Medications:     Scheduled Medications: . amiodarone  400 mg Oral BID  . aspirin EC  325 mg Oral Daily   Or  . aspirin  324 mg Per Tube Daily   Or  . aspirin  300 mg Rectal Daily  . atorvastatin  80 mg Oral q1800  .  bisacodyl  10 mg Oral Daily   Or  . bisacodyl  10 mg Rectal Daily  . chlorhexidine  15 mL Mouth Rinse BID  . Chlorhexidine Gluconate Cloth  6 each Topical Daily  . cloNIDine  0.2 mg Transdermal Weekly  . darbepoetin (ARANESP) injection - DIALYSIS  100 mcg Intravenous Q Sat-HD  . docusate sodium  200 mg Oral Daily  . feeding supplement (ENSURE ENLIVE)  237 mL Oral TID WC  . insulin aspart  0-24 Units Subcutaneous Q4H  . mouth rinse  15 mL Mouth Rinse q12n4p  . metoCLOPramide (REGLAN) injection  10 mg Intravenous Q6H  . mupirocin ointment   Nasal BID  . pantoprazole  40 mg Oral Daily  . sodium chloride flush  10-40 mL Intracatheter Q12H  . sodium chloride flush  3 mL Intravenous Q12H  . warfarin  5 mg Oral ONCE-1800  . Warfarin - Pharmacist Dosing Inpatient   Does not apply q1800    Infusions: . sodium chloride Stopped (06/14/17 1400)  . sodium chloride Stopped (06/13/17 1100)  . sodium chloride 10 mL/hr at 06/18/17 0700  . lactated ringers Stopped (06/14/17 1400)  . lactated ringers Stopped (06/15/17 1901)  . milrinone 0.375 mcg/kg/min (06/18/17 0700)  . nitroGLYCERIN Stopped (06/13/17 1700)  . piperacillin-tazobactam Stopped (06/18/17 5409)  . potassium chloride 10 mEq (06/18/17 0746)  . potassium PHOSPHATE in dextrose 5% IVPB 250 mL    . dialysate (PRISMASATE) 2,000 mL/hr at 06/18/17 0720  . dialysis replacement fluid (prismasate) 200 mL/hr at 06/17/17 2202  . dialysis replacement fluid (prismasate) 300 mL/hr at 06/17/17 1802  . sodium chloride    .  vancomycin      PRN Medications: sodium chloride, heparin, hydrALAZINE, ondansetron (ZOFRAN) IV, oxyCODONE, promethazine, sodium chloride, sodium chloride flush, sodium chloride flush, traMADol   Patient Profile   51 yo with history of CAD s/p anterior MI in 2010 and chronic systolic CHF (ischemic cardiomyopathy) as well as CKD stage III now s/p HeartMate 3 VAD placement.   Assessment/Plan:    1. Acute on chronic systolic  CHF: Ischemic cardiomyopathy. St Jude ICD. NSTEMI in March 2018 with DES to LAD and RCA, complicated by cardiogenic shock, low output requiring milrinone. Most recent echo 8/18 with EF 20-25%, moderate MR. He was admitted and started on milrinone with improved creatinine.  HeartMate 3 VAD placement on 10/8. Speed cut back to 5400 with onset of afib/RVR 10/11.  Stable today, MAP 80s generally.  He remains on milrinone 0.375 and dopamine 1.  He is on CVVH now due to AKI on CKD.  CVP 12-14, co-ox 63%.  - Continue CVVH with UF 150 cc/hr for 1 more day then likely stop and assess for renal recovery.  - Plan to stop dopamine today.  - Continue milrinone 0.375.  - INR 2, off heparin gtt and remains on warfarin. Continue warfarin with INR goal 2-3 (aim slightly higher with h/o Factor V Leiden heterozygosity) and ASA 325. LDH stable.  - Favor sildenafil for RV support after tapering off NO.  2. AKI on CKD stage 3: Now on CVVH. Tolerated UF 150 cc/hr well yesterday, CVP remains elevated but improved.  Minimal UOP.  Continue CVVH. Hope for recovery.  He has not been hypotensive that we have noted but suspect peri-op ATN. 3. CAD: NSTEMI in 3/18. LHC with 99% ulcerated lesion proximal RCA with left to right collaterals, 95% mid LAD stenosis after mid LAD stent. s/p PCI to RCA and LAD on 11/25/16.  - Off plavix for LVAD.  - Continue statin.   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 Recent V/Q scan did not show acute or chronic PE.  - He is a heterozygote for Factor V Leiden, plan for for ASA 325 and warfarin INR 2-3 with LVAD. No change.  5. Chronic N/V/D with gastroparesis: Tolerating diet.    6. DM: On Jardiance at home, will cover with sliding scale. Glucose stable.   7. S/P Multiple Tooth Extractions 4-15, 19-23, and 27-29.  8. Atrial fibrillation: Paroxysmal.  Back in NSR.  Transition amiodarone to po today.     9. ID: CXR with LLL atelectasis versus infiltrate. Afebrile.  - WBC 15.8 ->  25.7->24->20-> 14.  Empiric ABX with vanc and Zosyn.    I reviewed the LVAD parameters from today, and compared the results to the patient's prior recorded data.  No programming changes were made.  The LVAD is functioning within specified parameters.  The patient performs LVAD self-test daily.  LVAD interrogation was negative for any significant power changes, alarms or PI events/speed drops.  LVAD equipment check completed and is in good working order.  Back-up equipment present.   LVAD education done on emergency procedures and precautions and reviewed exit site care.  CRITICAL CARE Performed by: Loralie Champagne  Total critical care time: 35 minutes  Critical care time was exclusive of separately billable procedures and treating other patients.  Critical care was necessary to treat or prevent imminent or life-threatening deterioration.  Critical care was time spent personally by me on the following activities: development of treatment plan with patient and/or surrogate as well as nursing, discussions with consultants,  evaluation of patient's response to treatment, examination of patient, obtaining history from patient or surrogate, ordering and performing treatments and interventions, ordering and review of laboratory studies, ordering and review of radiographic studies, pulse oximetry and re-evaluation of patient's condition.  Bastien Strawser 06/18/2017 7:54 AM

## 2017-06-18 NOTE — Progress Notes (Signed)
[place] for Coumadin (asked by Dr. Prescott Gum for pharmacy to dose starting 10/13) Indication: LVAD, h/o DVT Factor V Leiden heterozygosity  Allergies  Allergen Reactions  . Metformin And Related Diarrhea    Patient Measurements: Height: 5' 8"  (172.7 cm) Weight: 207 lb 7.3 oz (94.1 kg) IBW/kg (Calculated) : 68.4   Vital Signs: Temp: 98.5 F (36.9 C) (10/14 0400) Temp Source: Axillary (10/14 0400) BP: 122/84 (10/14 0730) Pulse Rate: 107 (10/14 0700)  Labs:  Recent Labs  06/15/17 1218  06/16/17 0728 06/16/17 1140  06/16/17 1554  06/17/17 0414 06/17/17 1526 06/17/17 1537 06/17/17 1700 06/18/17 0315  HGB  --   < >  --  8.5*  --   --   < > 8.6* 8.8*  --   --  8.8*  HCT  --   < >  --  24.7*  --   --   < > 25.2* 26.0*  --   --  26.5*  PLT  --   < >  --  164  --   --   --  219  --   --   --  254  APTT  --   < >  --   --   --  90*  --  61*  --   --  71*  --   LABPROT  --   < >  --   --   --  27.5*  --  27.5*  --   --   --  22.4*  INR  --   < >  --   --   --  2.59  --  2.59  --   --   --  1.99  HEPARINUNFRC  --   --  <0.10*  --   --   --   --   --   --   --   --   --   CREATININE  --   < >  --   --   < >  --   < > 2.57*  2.56* 2.50* 2.53*  --  2.44*  CKTOTAL 309  --   --   --   --   --   --   --   --   --   --   --   < > = values in this interval not displayed.  Estimated Creatinine Clearance: 39.4 mL/min (A) (by C-G formula based on SCr of 2.44 mg/dL (H)).   Medical History: Past Medical History:  Diagnosis Date  . Angina   . ASCVD (arteriosclerotic cardiovascular disease)    , Anterior infarction 2005, LAD diagonal bifurcation intervention 03/2004  . Automatic implantable cardiac defibrillator -[employer]. Jude's       . Benign neoplasm of colon   . CHF (congestive heart failure) ([place])   . Chronic systolic heart failure ([place])   . Coronary artery disease     Widely patent previously placed stents in the left anterior   . Crohn's  disease ([place])   . 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 Dyspnea   . Gastroparesis   . GERD (gastroesophageal reflux disease)   . High cholesterol   . Hyperlipidemia   . Hypersomnolent    Previous diagnosis of narcolepsy  . Hypertension, essential   . Ischemic cardiomyopathy    Ejection fraction 15-20% catheterization 2010  . Type II or unspecified type diabetes mellitus without mention of complication, not  stated as uncontrolled   . Unspecified gastritis and gastroduodenitis without mention of hemorrhage     Assessment: 51 yo male on chronic Coumadin, admitted for LVAD placement.  Bridged with IV Heparin until INR therapeutic yesterday, and heparin stopped.  INR just slightly below goal today.  No overt bleeding or complications noted.   Remains on CRRT.  No heparin currently added to CRRT circuit.   Goal of Therapy:  INR 2-3 Monitor platelets by anticoagulation protocol: Yes   Plan:  1. Coumadin 5 mg x 1 tonight. 2. Daily PT/INR. 3. May need to add heparin back to CRRT circuit since no longer on systemic heparin.  Uvaldo Rising, BCPS  Clinical Pharmacist Pager [phone]  06/18/2017 7:47 AM

## 2017-06-18 NOTE — Progress Notes (Signed)
HeartMate 3 Rounding Note   postop day #6  Subjective:    HeartMate 3 implantation October 8 with preoperative ischemic cardiomyopathy, advanced heart failure on IV inotropes, indication was destination therapy  Patient stable postop day 1. Extubated  A.m. Postop day 1 Stable LVAD parameters with flow 4.2 L/m.    Acute on chronic renal failure with wt gain, increased volume, increased pulsatility- VAD speed up to 5600- now back to 5400 rpm with PI events which have since improved  Walked 272f 10-13  Oliguric Acute renal failure with rising cvp- trialysis cath placed 10-12 and CRT started goal 100 -200cc/hr removal for increased wt, cvp Hx factor V Leiden deficiency. Now  with po coumadin-  INR  Goal2.5, now 2.0 will give 5 mg this pm    LVAD INTERROGATION:  HeartMate IIILVAD:  Flow 4.4 liters/min, speed 5400, power 4.0, PI 3.0.  Controller Serial intact.   Objective:    Vital Signs:   Temp:  [98.1 F (36.7 C)-99.5 F (37.5 C)] 98.5 F (36.9 C) (10/14 0400) Pulse Rate:  [94-113] 107 (10/14 0700) Resp:  [13-29] 19 (10/14 0700) BP: (90-136)/(67-104) 129/97 (10/14 0700) SpO2:  [99 %-100 %] 100 % (10/14 0700) FiO2 (%):  [36 %] 36 % (10/13 0732) Weight:  [207 lb 7.3 oz (94.1 kg)] 207 lb 7.3 oz (94.1 kg) (10/14 0500) Last BM Date: 06/17/17 Mean arterial Pressure 85 mmHg  Intake/Output:   Intake/Output Summary (Last 24 hours) at 06/18/17 0731 Last data filed at 06/18/17 0726  Gross per 24 hour  Intake          3087.26 ml  Output             5947 ml  Net         -2859.74 ml     Physical Exam: General:  Well appearing. No resp difficulty HEENT: normal Neck: supple. JVP .; no bruits. No lymphadenopathy or thryomegaly appreciated. Cor: Mechanical heart sounds with LVAD hum present. Lungs: clear Abdomen: soft, nontender, nondistended. No hepatosplenomegaly. No bruits or masses. Good bowel sounds. Extremities: no cyanosis, clubbing, rash, mild edema of the extremities  edema Neuro: alert & orientedx3, cranial nerves grossly intact. moves all 4 extremities w/o difficulty. Affect pleasant  Telemetry: A paced at 88/m  Labs: Basic Metabolic Panel:  Recent Labs Lab 06/14/17 0310  06/15/17 0312  06/16/17 0200 06/16/17 1548 06/16/17 1600 06/17/17 0414 06/17/17 1526 06/17/17 1537 06/18/17 0315  NA 130*  < > 130*  < > 132* 135 136 137  136 136 133* 137  K 4.9  < > 4.4  < > 4.2 4.3 4.2 4.2  5.0 5.7* 5.7* 3.6  CL 97*  < > 95*  < > 96* 99* 100* 99*  99* 99* 99* 98*  CO2 21*  --  23  < > 22 24  --  26  25  --  26 25  GLUCOSE 220*  < > 101*  < > 96 149* 147* 152*  148* 188* 175* 137*  BUN 25*  < > 32*  < > 29* 23* 23* 21*  20 26* 20 15  CREATININE 2.75*  < > 4.04*  < > 3.71* 3.03* 2.80* 2.57*  2.56* 2.50* 2.53* 2.44*  CALCIUM 8.4*  --  8.3*  < > 8.1* 8.0*  --  8.1*  8.0*  --  7.9* 8.2*  MG 2.4  --  2.4  --  2.4  --   --  2.6*  --   --  2.4  PHOS 5.9*  --  6.6*  < > 4.6 3.3  --  2.2*  --  1.8* 1.5*  < > = values in this interval not displayed.  Liver Function Tests:  Recent Labs Lab 06/14/17 0310 06/15/17 0312  06/16/17 0200 06/16/17 1548 06/17/17 0414 06/17/17 1537 06/18/17 0315  AST 78* 52*  --  36  --  33  --  33  ALT 24 22  --  19  --  21  --  19  ALKPHOS 83 99  --  88  --  108  --  132*  BILITOT 2.5* 3.2*  --  3.0*  --  1.6*  --  1.3*  PROT 6.3* 6.5  --  6.3*  --  6.6  --  7.0  ALBUMIN 3.2* 2.9*  < > 2.7* 2.7* 2.7*  2.6* 2.7* 2.7*  < > = values in this interval not displayed. No results for input(s): LIPASE, AMYLASE in the last 168 hours. No results for input(s): AMMONIA in the last 168 hours.  CBC:  Recent Labs Lab 06/14/17 0310  06/15/17 0312  06/16/17 0200 06/16/17 1140 06/16/17 1600 06/17/17 0414 06/17/17 1526 06/18/17 0315  WBC 25.7*  --  24.5*  --  20.1* 16.7*  --  14.7*  --  14.9*  NEUTROABS 22.6*  --  21.7*  --  17.3*  --   --  11.5*  --  10.3*  HGB 8.8*  < > 8.8*  < > 8.1* 8.5* 9.9* 8.6* 8.8* 8.8*  HCT  27.0*  < > 25.9*  < > 22.7* 24.7* 29.0* 25.2* 26.0* 26.5*  MCV 79.2  --  77.8*  --  74.9* 76.2*  --  76.4*  --  77.9*  PLT 125*  --  158  --  144* 164  --  219  --  254  < > = values in this interval not displayed.  INR:  Recent Labs Lab 06/15/17 0312 06/16/17 0200 06/16/17 1554 06/17/17 0414 06/18/17 0315  INR 1.47 1.93 2.59 2.59 1.99    Other results:  EKG:   Imaging: Dg Chest Port 1 View  Result Date: 06/18/2017 CLINICAL DATA:  Chest tube, left ventricular assist device. EXAM: PORTABLE CHEST 1 VIEW COMPARISON:  Radiograph of June 17, 2017. FINDINGS: Stable cardiomediastinal silhouette. Left ventricular assistance device is unchanged in position. Left-sided pacemaker is also unchanged. Stable position of right internal jugular and subclavian catheters. No pneumothorax is noted. Left-sided chest tube is unchanged in position. Stable bibasilar subsegmental atelectasis. Bony thorax is unremarkable. IMPRESSION: Stable support apparatus including left ventricular assistance device. Left-sided chest tube is unchanged without evidence pneumothorax. Stable bibasilar subsegmental listhesis. Electronically Signed   By: Marijo Conception, M.D.   On: 06/18/2017 07:12   Dg Chest Port 1 View  Result Date: 06/17/2017 CLINICAL DATA:  Left ventricular assistance device. EXAM: PORTABLE CHEST 1 VIEW COMPARISON:  Radiograph of June 16, 2017. FINDINGS: Stable cardiomegaly. Left ventricular assistance device is unchanged in position. Left-sided chest tube is noted without pneumothorax. Right subclavian and internal jugular catheters are unchanged in position. Left-sided pacemaker is unchanged in position. Mild central pulmonary vascular congestion is noted. Mild bibasilar subsegmental atelectasis is noted. Bony thorax is unremarkable. IMPRESSION: Stable support apparatus including left ventricular assist device. Stable cardiomegaly and central pulmonary vascular congestion. Stable bibasilar  subsegmental atelectasis. Left-sided chest tube is noted without pneumothorax. Electronically Signed   By: Marijo Conception, M.D.   On: 06/17/2017 07:29   Dg  Chest Port 1 View  Result Date: 06/16/2017 CLINICAL DATA:  51 year old male status post cardiac surgery with left ventricular assist device in place EXAM: PORTABLE CHEST 1 VIEW COMPARISON:  Prior chest x-ray obtained earlier today at 5:52 a.m. FINDINGS: Stable right IJ vascular sheath and right IJ central venous catheter with the tips overlying the superior cavoatrial junction. A right subclavian central venous catheter is also present. The tip of that catheter overlies the upper right atrium. A left ventricular assist device is visualized. The patient is status post median sternotomy. Stable position of left subclavian approach cardiac rhythm maintenance device with leads projecting over the right atrium and right ventricle. Two right-sided chest tubes are in unchanged position. No evidence of pneumothorax. Stable appearance of the lungs compared earlier today with persistent very low inspiratory volumes, bibasilar atelectasis and bilateral layering pleural effusions. There is pulmonary vascular congestion without overt edema. A small amount of fluid is present within the fissures. No acute osseous abnormality. IMPRESSION: 1. Stable and satisfactory support apparatus. 2. No interval change in the appearance of the lungs compared earlier this morning. Persistent very low inspiratory volumes, bibasilar atelectasis, small bilateral layering pleural effusions and pulmonary vascular congestion. Electronically Signed   By: Jacqulynn Cadet M.D.   On: 06/16/2017 09:23     Medications:     Scheduled Medications: . amiodarone  400 mg Oral BID  . aspirin EC  325 mg Oral Daily   Or  . aspirin  324 mg Per Tube Daily   Or  . aspirin  300 mg Rectal Daily  . atorvastatin  80 mg Oral q1800  . bisacodyl  10 mg Oral Daily   Or  . bisacodyl  10 mg Rectal  Daily  . chlorhexidine  15 mL Mouth Rinse BID  . Chlorhexidine Gluconate Cloth  6 each Topical Daily  . cloNIDine  0.2 mg Transdermal Weekly  . darbepoetin (ARANESP) injection - DIALYSIS  100 mcg Intravenous Q Sat-HD  . docusate sodium  200 mg Oral Daily  . feeding supplement (ENSURE ENLIVE)  237 mL Oral TID WC  . insulin aspart  0-24 Units Subcutaneous Q4H  . mouth rinse  15 mL Mouth Rinse q12n4p  . metoCLOPramide (REGLAN) injection  10 mg Intravenous Q6H  . mupirocin ointment   Nasal BID  . pantoprazole  40 mg Oral Daily  . sodium chloride flush  10-40 mL Intracatheter Q12H  . sodium chloride flush  3 mL Intravenous Q12H  . warfarin  5 mg Oral ONCE-1800  . Warfarin - Pharmacist Dosing Inpatient   Does not apply q1800    Infusions: . sodium chloride Stopped (06/14/17 1400)  . sodium chloride Stopped (06/13/17 1100)  . sodium chloride 10 mL/hr at 06/18/17 0700  . lactated ringers Stopped (06/14/17 1400)  . lactated ringers Stopped (06/15/17 1901)  . milrinone 0.375 mcg/kg/min (06/18/17 0700)  . nitroGLYCERIN Stopped (06/13/17 1700)  . piperacillin-tazobactam Stopped (06/18/17 8250)  . potassium chloride Stopped (06/18/17 0659)  . potassium PHOSPHATE in dextrose 5% IVPB 250 mL    . dialysate (PRISMASATE) 2,000 mL/hr at 06/18/17 0720  . dialysis replacement fluid (prismasate) 200 mL/hr at 06/17/17 2202  . dialysis replacement fluid (prismasate) 300 mL/hr at 06/17/17 1802  . sodium chloride      PRN Medications: sodium chloride, heparin, hydrALAZINE, ondansetron (ZOFRAN) IV, oxyCODONE, promethazine, sodium chloride, sodium chloride flush, sodium chloride flush, traMADol   Assessment:  HeartMate 3 VAD implant for ischemic cardiomyopathy Preoperative ventricular ectopy with permanent pacemaker-AICD Preoperative  moderate chronic kidney disease stage III Preoperative hemicolectomy for granulomatous colitis Poorly controlled diabetes mellitus with hemoglobin A1c 10.0,  neuropathy Peripheral vascular disease with ABIs 0.5 bilaterally  postoperative acute renal failure with rising creatinine 4.2 and oliguria- HD cath placed and CRT started Postop atrial fib Excellent VAD performance postop  Plan/Discussion:      Continue 0.375 milrinone for RV function. Nitric oxide at 4 ppm   Wean off dopamine for elevated map  NSR - transition to po amiodarone   evidence of acute on chronic renal insufficiency with low urine output and fluid retention and poor response to Lasix - CRT started and working well Wt down to 206 ibs  preop 195  DC pocket drain, DC R IJ line Full liq diet  I reviewed the LVAD parameters from today, and compared the results to the patient's prior recorded data.  No programming changes were made.  The LVAD is functioning within specified parameters.  The patient performs LVAD self-test daily.  LVAD interrogation was negative for any significant power changes, alarms or PI events/speed drops.  LVAD equipment check completed and is in good working order.  Back-up equipment present.   LVAD education done on emergency procedures and precautions and reviewed exit site care.  Length of Stay: [place] III 06/18/2017, 7:31 AM

## 2017-06-18 NOTE — Progress Notes (Signed)
Nephrology paged to notify of potassium 3.6 in context of CRRT and creatinine 2.44 and scant UOP. Given orders for 4 runs of KCl. Will administer and continue to monitor.

## 2017-06-18 NOTE — Plan of Care (Signed)
Problem: Activity: Goal: Risk for activity intolerance will decrease Outcome: Progressing Patient walked 295 feet in hall with assistance. Tolerated well.   Problem: Respiratory: Goal: Ability to maintain adequate ventilation will improve Outcome: Progressing Patient uses IS with staff, but does not initiate use on his own. Only reaching 500 on IS. Will cont to encourage use along with cough and deep breathing exercises as tolerated.

## 2017-06-19 ENCOUNTER — Inpatient Hospital Stay (HOSPITAL_COMMUNITY): Payer: BLUE CROSS/BLUE SHIELD

## 2017-06-19 ENCOUNTER — Encounter (HOSPITAL_COMMUNITY): Payer: BLUE CROSS/BLUE SHIELD

## 2017-06-19 LAB — CBC WITH DIFFERENTIAL/PLATELET
Basophils Absolute: 0 10*3/uL (ref 0.0–0.1)
Basophils Relative: 0 %
Eosinophils Absolute: 0.1 10*3/uL (ref 0.0–0.7)
Eosinophils Relative: 1 %
HCT: 25 % — ABNORMAL LOW (ref 39.0–52.0)
Hemoglobin: 8.6 g/dL — ABNORMAL LOW (ref 13.0–17.0)
Lymphocytes Relative: 11 %
Lymphs Abs: 1.6 10*3/uL (ref 0.7–4.0)
MCH: 27.3 pg (ref 26.0–34.0)
MCHC: 34.4 g/dL (ref 30.0–36.0)
MCV: 79.4 fL (ref 78.0–100.0)
Monocytes Absolute: 1.8 10*3/uL — ABNORMAL HIGH (ref 0.1–1.0)
Monocytes Relative: 13 %
Neutro Abs: 10.6 10*3/uL — ABNORMAL HIGH (ref 1.7–7.7)
Neutrophils Relative %: 75 %
Platelets: 275 10*3/uL (ref 150–400)
RBC: 3.15 MIL/uL — ABNORMAL LOW (ref 4.22–5.81)
RDW: 17.8 % — ABNORMAL HIGH (ref 11.5–15.5)
WBC: 14.1 10*3/uL — ABNORMAL HIGH (ref 4.0–10.5)

## 2017-06-19 LAB — CBC
HEMATOCRIT: 26.3 % — AB (ref 39.0–52.0)
HEMOGLOBIN: 8.6 g/dL — AB (ref 13.0–17.0)
MCH: 26 pg (ref 26.0–34.0)
MCHC: 32.7 g/dL (ref 30.0–36.0)
MCV: 79.5 fL (ref 78.0–100.0)
Platelets: 292 10*3/uL (ref 150–400)
RBC: 3.31 MIL/uL — ABNORMAL LOW (ref 4.22–5.81)
RDW: 17.3 % — AB (ref 11.5–15.5)
WBC: 14.4 10*3/uL — ABNORMAL HIGH (ref 4.0–10.5)

## 2017-06-19 LAB — GLUCOSE, CAPILLARY
GLUCOSE-CAPILLARY: 119 mg/dL — AB (ref 65–99)
GLUCOSE-CAPILLARY: 68 mg/dL (ref 65–99)
Glucose-Capillary: 145 mg/dL — ABNORMAL HIGH (ref 65–99)
Glucose-Capillary: 200 mg/dL — ABNORMAL HIGH (ref 65–99)
Glucose-Capillary: 367 mg/dL — ABNORMAL HIGH (ref 65–99)
Glucose-Capillary: 191 mg/dL — ABNORMAL HIGH (ref 65–99)
Glucose-Capillary: 151 mg/dL — ABNORMAL HIGH (ref 65–99)

## 2017-06-19 LAB — COOXEMETRY PANEL
Carboxyhemoglobin: 1.6 % — ABNORMAL HIGH (ref 0.5–1.5)
Methemoglobin: 1.1 % (ref 0.0–1.5)
O2 Saturation: 66.7 %
Total hemoglobin: 10.3 g/dL — ABNORMAL LOW (ref 12.0–16.0)

## 2017-06-19 LAB — VANCOMYCIN, RANDOM: Vancomycin Rm: 26

## 2017-06-19 LAB — COMPREHENSIVE METABOLIC PANEL
ALBUMIN: 2.5 g/dL — AB (ref 3.5–5.0)
ALT: 19 U/L (ref 17–63)
AST: 33 U/L (ref 15–41)
Alkaline Phosphatase: 110 U/L (ref 38–126)
Anion gap: 13 (ref 5–15)
BILIRUBIN TOTAL: 1.5 mg/dL — AB (ref 0.3–1.2)
BUN: 10 mg/dL (ref 6–20)
CO2: 23 mmol/L (ref 22–32)
CREATININE: 2.52 mg/dL — AB (ref 0.61–1.24)
Calcium: 8 mg/dL — ABNORMAL LOW (ref 8.9–10.3)
Chloride: 99 mmol/L — ABNORMAL LOW (ref 101–111)
GFR calc Af Amer: 32 mL/min — ABNORMAL LOW (ref >60)
GFR, EST NON AFRICAN AMERICAN: 28 mL/min — AB (ref >60)
GLUCOSE: 125 mg/dL — AB (ref 65–99)
POTASSIUM: 4.3 mmol/L (ref 3.5–5.1)
Sodium: 135 mmol/L (ref 135–145)
TOTAL PROTEIN: 6.5 g/dL (ref 6.5–8.1)

## 2017-06-19 LAB — RENAL FUNCTION PANEL
ANION GAP: 9 (ref 5–15)
Albumin: 2.4 g/dL — ABNORMAL LOW (ref 3.5–5.0)
BUN: 15 mg/dL (ref 6–20)
CALCIUM: 7.7 mg/dL — AB (ref 8.9–10.3)
CO2: 25 mmol/L (ref 22–32)
Chloride: 97 mmol/L — ABNORMAL LOW (ref 101–111)
Creatinine, Ser: 2.93 mg/dL — ABNORMAL HIGH (ref 0.61–1.24)
GFR calc Af Amer: 27 mL/min — ABNORMAL LOW (ref >60)
GFR calc non Af Amer: 23 mL/min — ABNORMAL LOW (ref >60)
GLUCOSE: 365 mg/dL — AB (ref 65–99)
POTASSIUM: 4.1 mmol/L (ref 3.5–5.1)
Phosphorus: 2 mg/dL — ABNORMAL LOW (ref 2.5–4.6)
SODIUM: 131 mmol/L — AB (ref 135–145)

## 2017-06-19 LAB — BRAIN NATRIURETIC PEPTIDE: B Natriuretic Peptide: 746 pg/mL — ABNORMAL HIGH (ref 0.0–100.0)

## 2017-06-19 LAB — PHOSPHORUS: Phosphorus: 1.9 mg/dL — ABNORMAL LOW (ref 2.5–4.6)

## 2017-06-19 LAB — LACTATE DEHYDROGENASE: LDH: 354 U/L — AB (ref 98–192)

## 2017-06-19 LAB — PROTIME-INR
INR: 2.35
PROTHROMBIN TIME: 25.5 s — AB (ref 11.4–15.2)

## 2017-06-19 LAB — MAGNESIUM: MAGNESIUM: 2.4 mg/dL (ref 1.7–2.4)

## 2017-06-19 MED ORDER — PIPERACILLIN-TAZOBACTAM IN DEX 2-0.25 GM/50ML IV SOLN
2.2500 g | Freq: Four times a day (QID) | INTRAVENOUS | Status: DC
  Administered 2017-06-19 – 2017-06-20 (×4): 2.25 g via INTRAVENOUS
  Filled 2017-06-19 (×7): qty 50

## 2017-06-19 MED ORDER — WARFARIN SODIUM 2.5 MG PO TABS
2.5000 mg | ORAL_TABLET | Freq: Once | ORAL | Status: AC
  Administered 2017-06-19: 2.5 mg via ORAL
  Filled 2017-06-19: qty 1

## 2017-06-19 MED ORDER — SILDENAFIL CITRATE 20 MG PO TABS
20.0000 mg | ORAL_TABLET | Freq: Three times a day (TID) | ORAL | Status: DC
  Administered 2017-06-19 – 2017-07-03 (×40): 20 mg via ORAL
  Filled 2017-06-19 (×43): qty 1

## 2017-06-19 NOTE — Progress Notes (Signed)
Stopped in to visit patient while rounding this morning.  Patient sitting up in chair after a walk this morning.  He states things are coming along one day at a time.  Chaplain happy to continue to provide compassionate and encouraging care to this patient.  Will continue to to follow up with patient.  Chaplain would like to thank the medical team and other care teams for caring for this patient and family.    06/19/17 1158  Clinical Encounter Type  Visited With Patient  Visit Type Follow-up;Psychological support;Spiritual support;Post-op

## 2017-06-19 NOTE — Progress Notes (Signed)
Nitric turned off per MD order but remains at bedside.

## 2017-06-19 NOTE — Progress Notes (Signed)
Pharmacy Antibiotic Note  [name] is a 51 y.o. male admitted on 06/07/2017 with surgical prophylaxis.  Pharmacy has been consulted for vancomycin dosing x 48 hrs post-op. Renal function worsening acutely, now on CRRT.  WBC up - per MD add Zosyn and continue vancomycin for now. Vancomycin random level this morning 26, pt to stop CRRT today. Discussed with MD - will discontinue any further vancomycin for now.  Plan: -Stop vancomycin -Adjust Zosyn to 2.25g IV q6h -Follow-up renal function   Height: 5' 8"  (172.7 cm) Weight: 199 lb 1.2 oz (90.3 kg) IBW/kg (Calculated) : 68.4  Temp (24hrs), Avg:98.4 F (36.9 C), Min:97.9 F (36.6 C), Max:99.1 F (37.3 C)   Recent Labs Lab 06/16/17 0200 06/16/17 1140  06/17/17 0414  06/17/17 1537 06/18/17 0315 06/18/17 1624 06/18/17 1626 06/19/17 0326  WBC 20.1* 16.7*  --  14.7*  --   --  14.9*  --   --  14.1*  CREATININE 3.71*  --   < > 2.57*  2.56*  < > 2.53* 2.44* 2.44* 2.40* 2.52*  VANCORANDOM  --   --   --  36  --   --  23  --   --  26  < > = values in this interval not displayed.  Estimated Creatinine Clearance: 37.4 mL/min (A) (by C-G formula based on SCr of 2.52 mg/dL (H)).    Allergies  Allergen Reactions  . Metformin And Related Diarrhea    Antimicrobials this admission: Vancomycin 10/8 >>  Cefuroxime 10/8 >>10/10 Zosyn 10/10 >>  Dose adjustments this admission: 10/10: SCr up> vancomycin to q12h 10/11: SCr up > hold vancomycin 10/11 VR 59 > hold vancomycin 10/13 VR 36 > continue to hold vancomycin 10/14 VR 23 > redose w/ 1g today 10/15 VR 26 > stopping vancomycin today  Microbiology results: 10/5 MRSA PCR: neg  Thank you for allowing pharmacy to be a part of this patient's care.  Arrie Senate, PharmD PGY-2 Cardiology Pharmacy Resident Pager: [phone] 06/19/2017

## 2017-06-19 NOTE — Progress Notes (Signed)
HeartMate 3 Rounding Note   postop day #6  Subjective:    HeartMate 3 implantation October 8 with preoperative ischemic cardiomyopathy, advanced heart failure on IV inotropes, indication was destination therapy  Patient stable, extubated  postop day 1.No bleeding.Started on low dose heparin bridge to coumadin    Acute on chronic renal failure with wt gain, increased volume, increased pulsatility- VAD speed up to 5600- now back to 5400 rpm with less PI events  Volume overload improved with CRT.   Walking in hall  Oliguric Acute renal failure with rising cvp- trialysis cath placed 10-11 and CRT started goal 100 -200cc/hr removal for increased wt, cvp With wt and CVP normal will transition to intermittent HD and follow renal recovery  Hx factor V Leiden deficiency. Now  with po coumadin-  INR  Goal2.5, dose per pharmD    LVAD INTERROGATION:  HeartMate IIILVAD:  Flow 4.4 liters/min, speed 5400, power 4.0, PI 3.0.  Controller Serial intact.   Objective:    Vital Signs:   Temp:  [97.9 F (36.6 C)-99.1 F (37.3 C)] 99.1 F (37.3 C) (10/15 0731) Pulse Rate:  [97-143] 111 (10/15 0800) Resp:  [8-32] 22 (10/15 0800) BP: (85-124)/(61-94) 92/72 (10/15 0800) SpO2:  [97 %-100 %] 100 % (10/15 0800) Weight:  [199 lb 1.2 oz (90.3 kg)] 199 lb 1.2 oz (90.3 kg) (10/15 0500) Last BM Date: 06/19/17 Mean arterial Pressure 85 mmHg  Intake/Output:   Intake/Output Summary (Last 24 hours) at 06/19/17 0854 Last data filed at 06/19/17 0834  Gross per 24 hour  Intake           3238.4 ml  Output             7324 ml  Net          -4085.6 ml     Physical Exam: General:  Well appearing. No resp difficulty HEENT: normal Neck: supple. JVP .; no bruits. No lymphadenopathy or thryomegaly appreciated. Cor: Mechanical heart sounds with LVAD hum present. Lungs: clear Abdomen: soft, nontender, nondistended. No hepatosplenomegaly. No bruits or masses. Good bowel sounds. Extremities: no cyanosis, clubbing,  rash, mild edema of the extremities edema Neuro: alert & orientedx3, cranial nerves grossly intact. moves all 4 extremities w/o difficulty. Affect pleasant  Telemetry: A paced at 88/m  Labs: Basic Metabolic Panel:  Recent Labs Lab 06/15/17 0312  06/16/17 0200  06/17/17 0414  06/17/17 1537 06/18/17 0315 06/18/17 1624 06/18/17 1626 06/19/17 0326  NA 130*  < > 132*  < > 137  136  < > 133* 137 136 137 135  K 4.4  < > 4.2  < > 4.2  5.0  < > 5.7* 3.6 4.4 4.4 4.3  CL 95*  < > 96*  < > 99*  99*  < > 99* 98* 100* 100* 99*  CO2 23  < > 22  < > 26  25  --  26 25 27   --  23  GLUCOSE 101*  < > 96  < > 152*  148*  < > 175* 137* 116* 120* 125*  BUN 32*  < > 29*  < > 21*  20  < > 20 15 12 14 10   CREATININE 4.04*  < > 3.71*  < > 2.57*  2.56*  < > 2.53* 2.44* 2.44* 2.40* 2.52*  CALCIUM 8.3*  < > 8.1*  < > 8.1*  8.0*  --  7.9* 8.2* 7.9*  --  8.0*  MG 2.4  --  2.4  --  2.6*  --   --  2.4  --   --  2.4  PHOS 6.6*  < > 4.6  < > 2.2*  --  1.8* 1.5* 2.0*  --  1.9*  < > = values in this interval not displayed.  Liver Function Tests:  Recent Labs Lab 06/15/17 0312  06/16/17 0200  06/17/17 0414 06/17/17 1537 06/18/17 0315 06/18/17 1624 06/19/17 0326  AST 52*  --  36  --  33  --  33  --  33  ALT 22  --  19  --  21  --  19  --  19  ALKPHOS 99  --  88  --  108  --  132*  --  110  BILITOT 3.2*  --  3.0*  --  1.6*  --  1.3*  --  1.5*  PROT 6.5  --  6.3*  --  6.6  --  7.0  --  6.5  ALBUMIN 2.9*  < > 2.7*  < > 2.7*  2.6* 2.7* 2.7* 2.5* 2.5*  < > = values in this interval not displayed. No results for input(s): LIPASE, AMYLASE in the last 168 hours. No results for input(s): AMMONIA in the last 168 hours.  CBC:  Recent Labs Lab 06/15/17 0312  06/16/17 0200 06/16/17 1140  06/17/17 0414 06/17/17 1526 06/18/17 0315 06/18/17 1626 06/19/17 0326  WBC 24.5*  --  20.1* 16.7*  --  14.7*  --  14.9*  --  14.1*  NEUTROABS 21.7*  --  17.3*  --   --  11.5*  --  10.3*  --  10.6*  HGB 8.8*  < >  8.1* 8.5*  < > 8.6* 8.8* 8.8* 9.2* 8.6*  HCT 25.9*  < > 22.7* 24.7*  < > 25.2* 26.0* 26.5* 27.0* 25.0*  MCV 77.8*  --  74.9* 76.2*  --  76.4*  --  77.9*  --  79.4  PLT 158  --  144* 164  --  219  --  254  --  275  < > = values in this interval not displayed.  INR:  Recent Labs Lab 06/16/17 0200 06/16/17 1554 06/17/17 0414 06/18/17 0315 06/19/17 0326  INR 1.93 2.59 2.59 1.99 2.35    Other results:  EKG:   Imaging: Dg Chest Port 1 View  Result Date: 06/19/2017 CLINICAL DATA:  51 year old male with weakness and sore chest. Subsequent encounter. EXAM: PORTABLE CHEST 1 VIEW COMPARISON:  06/18/2017. FINDINGS: Left ventricular assist device and AICD/pacer in place unchanged in position cardiomegaly. Coronary artery disease. Left chest tube removed without gross pneumothorax. Two right central lines are in place. Right internal jugular catheter tip mid superior vena cava level. Right subclavian line tip right atrium level. To be within the distal superior vena cava, this would need to be retracted by 5 cm. Asymmetric airspace disease greater left possibly representing asymmetric pulmonary edema minimally more notable than on prior exam. Slightly poor inspiration. Question left base subsegmental atelectasis. IMPRESSION: Cardiomegaly with left ventricular assist device in place. Right subclavian line tip right atrium level. To be within the distal superior vena cava, this would need to be retracted by 5 cm. Left chest tube removed without gross pneumothorax. Asymmetric airspace disease greater left possibly representing asymmetric pulmonary edema minimally more notable than on prior exam. Electronically Signed   By: Genia Del M.D.   On: 06/19/2017 07:28   Dg Chest Port 1 View  Result Date: 06/18/2017 CLINICAL DATA:  Chest tube,  left ventricular assist device. EXAM: PORTABLE CHEST 1 VIEW COMPARISON:  Radiograph of June 17, 2017. FINDINGS: Stable cardiomediastinal silhouette. Left  ventricular assistance device is unchanged in position. Left-sided pacemaker is also unchanged. Stable position of right internal jugular and subclavian catheters. No pneumothorax is noted. Left-sided chest tube is unchanged in position. Stable bibasilar subsegmental atelectasis. Bony thorax is unremarkable. IMPRESSION: Stable support apparatus including left ventricular assistance device. Left-sided chest tube is unchanged without evidence pneumothorax. Stable bibasilar subsegmental listhesis. Electronically Signed   By: Marijo Conception, M.D.   On: 06/18/2017 07:12     Medications:     Scheduled Medications: . amiodarone  400 mg Oral BID  . aspirin EC  325 mg Oral Daily   Or  . aspirin  324 mg Per Tube Daily   Or  . aspirin  300 mg Rectal Daily  . atorvastatin  80 mg Oral q1800  . bisacodyl  10 mg Oral Daily   Or  . bisacodyl  10 mg Rectal Daily  . chlorhexidine  15 mL Mouth Rinse BID  . Chlorhexidine Gluconate Cloth  6 each Topical Daily  . cloNIDine  0.2 mg Transdermal Weekly  . darbepoetin (ARANESP) injection - DIALYSIS  100 mcg Intravenous Q Sat-HD  . docusate sodium  200 mg Oral Daily  . feeding supplement (ENSURE ENLIVE)  237 mL Oral TID WC  . insulin aspart  0-24 Units Subcutaneous Q4H  . mouth rinse  15 mL Mouth Rinse q12n4p  . metoCLOPramide (REGLAN) injection  10 mg Intravenous Q6H  . mupirocin ointment   Nasal BID  . pantoprazole  40 mg Oral Daily  . sodium chloride flush  10-40 mL Intracatheter Q12H  . sodium chloride flush  3 mL Intravenous Q12H  . warfarin  2.5 mg Oral ONCE-1800  . Warfarin - Pharmacist Dosing Inpatient   Does not apply q1800    Infusions: . sodium chloride Stopped (06/14/17 1400)  . sodium chloride Stopped (06/13/17 1100)  . sodium chloride 10 mL/hr at 06/19/17 0800  . lactated ringers Stopped (06/14/17 1400)  . lactated ringers Stopped (06/15/17 1901)  . milrinone 0.375 mcg/kg/min (06/19/17 0800)  . nitroGLYCERIN Stopped (06/13/17 1700)  .  piperacillin-tazobactam Stopped (06/19/17 0543)  . dialysis replacement fluid (prismasate) 200 mL/hr at 06/19/17 0050  . dialysis replacement fluid (prismasate) 300 mL/hr at 06/19/17 0531  . dialysate (PRISMASATE) 2,000 mL/hr at 06/19/17 0723  . sodium chloride      PRN Medications: sodium chloride, heparin, hydrALAZINE, LORazepam, ondansetron (ZOFRAN) IV, oxyCODONE, promethazine, sodium chloride, sodium chloride flush, sodium chloride flush, traMADol   Assessment:  HeartMate 3 VAD implant for ischemic cardiomyopathy Preoperative ventricular ectopy with permanent pacemaker-AICD Preoperative moderate chronic kidney disease stage III Preoperative hemicolectomy for granulomatous colitis Poorly controlled diabetes mellitus with hemoglobin A1c 10.0, neuropathy Peripheral vascular disease with ABIs 0.5 bilaterally  postoperative acute renal failure with rising creatinine 4.2 and oliguria- HD cath placed and CRT applied 10-12 - 10-15 Postop atrial fib on amiodarone Excellent VAD performance postop  Plan/Discussion:      Continue 0.375 milrinone for RV function. Nitric oxide at 4 ppm wean off today     NSR - transition to po amiodarone   evidence of acute on chronic renal insufficiency with low urine output and fluid retention and poor response to Lasix - CRT started10-11 and working well Wt down to 199  lbs  preop 195. Stop CRT with BP improved and transition to HD   All chest drains out -  incisions clean, dry  Adv diet to mech soft  I reviewed the LVAD parameters from today, and compared the results to the patient's prior recorded data.  No programming changes were made.  The LVAD is functioning within specified parameters.  The patient performs LVAD self-test daily.  LVAD interrogation was negative for any significant power changes, alarms or PI events/speed drops.  LVAD equipment check completed and is in good working order.  Back-up equipment present.   LVAD education done on  emergency procedures and precautions and reviewed exit site care.  Length of Stay: [place] III 06/19/2017, 8:54 AM

## 2017-06-19 NOTE — Plan of Care (Signed)
Problem: Pain Managment: Goal: General experience of comfort will improve Outcome: Adequate for Discharge No c/o pain in over 48 hours. Will continue to monitor.  Problem: Fluid Volume: Goal: Ability to maintain a balanced intake and output will improve Outcome: Progressing Monitoring UOP and pt fluid intake. Pt due to have HD tomorrow d/t impaired renal function.  Problem: Nutrition: Goal: Adequate nutrition will be maintained Outcome: Progressing Encouraging pt to eat and snack, giving Glucerna&NeproCarbSteady drinks as needed to supplement.  Problem: Fluid Volume: Goal: Risk for excess fluid volume will decrease Outcome: Progressing Monitoring intake/output, CRRT stopped today, pt due for intermittent HD tomorrow. Monitoring UOP/kidney function closely.  Problem: Respiratory: Goal: Ability to maintain adequate ventilation will improve Outcome: Progressing NO weaned off today. Pt now on 2L [place] O2. Encouraging and practicing IS w/pt; pt now able to reach 750 mark on IS consistently, which is an improvement.

## 2017-06-19 NOTE — Progress Notes (Signed)
[place] for Coumadin (asked by Dr. Prescott Gum for pharmacy to dose starting 10/13) Indication: LVAD, h/o DVT Factor V Leiden heterozygosity  Allergies  Allergen Reactions  . Metformin And Related Diarrhea    Patient Measurements: Height: 5' 8"  (172.7 cm) Weight: 199 lb 1.2 oz (90.3 kg) IBW/kg (Calculated) : 68.4   Vital Signs: Temp: 99.1 F (37.3 C) (10/15 0731) Temp Source: Oral (10/15 0731) BP: 92/72 (10/15 0800) Pulse Rate: 111 (10/15 0800)  Labs:  Recent Labs  06/16/17 1554  06/17/17 0414  06/17/17 1700 06/18/17 0315 06/18/17 1624 06/18/17 1626 06/19/17 0326  HGB  --   < > 8.6*  < >  --  8.8*  --  9.2* 8.6*  HCT  --   < > 25.2*  < >  --  26.5*  --  27.0* 25.0*  PLT  --   --  219  --   --  254  --   --  275  APTT 90*  --  61*  --  71*  --   --   --   --   LABPROT 27.5*  --  27.5*  --   --  22.4*  --   --  25.5*  INR 2.59  --  2.59  --   --  1.99  --   --  2.35  CREATININE  --   < > 2.57*  2.56*  < >  --  2.44* 2.44* 2.40* 2.52*  < > = values in this interval not displayed.  Estimated Creatinine Clearance: 37.4 mL/min (A) (by C-G formula based on SCr of 2.52 mg/dL (H)).   Medical History: Past Medical History:  Diagnosis Date  . Angina   . ASCVD (arteriosclerotic cardiovascular disease)    , Anterior infarction 2005, LAD diagonal bifurcation intervention 03/2004  . Automatic implantable cardiac defibrillator -St. Jude's       . Benign neoplasm of colon   . CHF (congestive heart failure) ([place])   . Chronic systolic heart failure ([place])   . Coronary artery disease     Widely patent previously placed stents in the left anterior   . Crohn's disease ([place])   . 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 Dyspnea   . Gastroparesis   . GERD (gastroesophageal reflux disease)   . High cholesterol   . Hyperlipidemia   . Hypersomnolent    Previous diagnosis of narcolepsy  . Hypertension, essential   .  Ischemic cardiomyopathy    Ejection fraction 15-20% catheterization 2010  . Type II or unspecified type diabetes mellitus without mention of complication, not stated as uncontrolled   . Unspecified gastritis and gastroduodenitis without mention of hemorrhage     Assessment: 51 yo male on chronic Coumadin, admitted for LVAD placement. Bridged with IV Heparin until INR therapeutic over the weekend and then heparin stopped. INR therapeutic at 2.35, no issues per RN.  Remains on CRRT.  No heparin currently added to CRRT circuit.   Goal of Therapy:  INR 2-3 Monitor platelets by anticoagulation protocol: Yes   Plan:  -Coumadin 2.5 mg x 1 tonight -Daily PT/INR.  Arrie Senate, PharmD PGY-2 Cardiology Pharmacy Resident Pager: [phone] 06/19/2017

## 2017-06-19 NOTE — Progress Notes (Signed)
Patient ID: [name], male   DOB: [birth year], 51 y.o.   MRN: [id number]   Advanced Heart Failure VAD Team Note  Subjective:    Admitted 10/3 for RHC/Swan implantation and optimization prior to LVAD.  HeartMate 3 VAD implanted 10/8.  CVVH begun 10/11  Remains on CVVH , negative 4.2 liters. Weight down Milrinone 0.375 mcg. Todays CO-OX 67%. On NO 4 PPM.   Denies SOB. Denies N/V.   LVAD INTERROGATION:  HeartMate 3:  Flow 4.3  liters/min, speed 5400 rpm, power 3.9, PI  3.2  Rare PI events.    Objective:    Vital Signs:   Temp:  [97.9 F (36.6 C)-99.1 F (37.3 C)] 99.1 F (37.3 C) (10/15 0731) Pulse Rate:  [97-143] 111 (10/15 0800) Resp:  [8-32] 22 (10/15 0800) BP: (85-124)/(61-94) 92/72 (10/15 0800) SpO2:  [97 %-100 %] 100 % (10/15 0800) Weight:  [199 lb 1.2 oz (90.3 kg)] 199 lb 1.2 oz (90.3 kg) (10/15 0500) Last BM Date: 06/19/17 Mean arterial Pressure 70-80s   Intake/Output:   Intake/Output Summary (Last 24 hours) at 06/19/17 0818 Last data filed at 06/19/17 0800  Gross per 24 hour  Intake          2932.57 ml  Output             7324 ml  Net         -4391.43 ml     Physical Exam: CVP 7-8  GENERAL: Well appearing, male who presents to clinic today in no acute distress. HEENT: normal  NECK: Supple, JVP  7-8 . 2+ bilaterally, no bruits.  No lymphadenopathy or thyromegaly appreciated.  RIJ HD catheter R subclavian catheter CARDIAC:  Mechanical heart sounds with LVAD hum present.  LUNGS:  Clear to auscultation bilaterally.  ABDOMEN:  Soft, round, nontender, positive bowel sounds x4.     LVAD exit site:   Dressing dry and intact.  No erythema or drainage.  Stabilization device present and accurately applied.  Driveline dressing is being changed daily per sterile technique. EXTREMITIES:  Warm and dry, no cyanosis, clubbing, rash or edema  NEUROLOGIC:  Alert and oriented x 4.  Gait steady.  No aphasia.  No dysarthria.  Affect pleasant.        Telemetry   Sinus  Tach 100s with PVCs personally reviewed.   Labs   Basic Metabolic Panel:  Recent Labs Lab 06/15/17 0312  06/16/17 0200  06/17/17 0414  06/17/17 1537 06/18/17 0315 06/18/17 1624 06/18/17 1626 06/19/17 0326  NA 130*  < > 132*  < > 137  136  < > 133* 137 136 137 135  K 4.4  < > 4.2  < > 4.2  5.0  < > 5.7* 3.6 4.4 4.4 4.3  CL 95*  < > 96*  < > 99*  99*  < > 99* 98* 100* 100* 99*  CO2 23  < > 22  < > 26  25  --  26 25 27   --  23  GLUCOSE 101*  < > 96  < > 152*  148*  < > 175* 137* 116* 120* 125*  BUN 32*  < > 29*  < > 21*  20  < > 20 15 12 14 10   CREATININE 4.04*  < > 3.71*  < > 2.57*  2.56*  < > 2.53* 2.44* 2.44* 2.40* 2.52*  CALCIUM 8.3*  < > 8.1*  < > 8.1*  8.0*  --  7.9* 8.2* 7.9*  --  8.0*  MG 2.4  --  2.4  --  2.6*  --   --  2.4  --   --  2.4  PHOS 6.6*  < > 4.6  < > 2.2*  --  1.8* 1.5* 2.0*  --  1.9*  < > = values in this interval not displayed.  Liver Function Tests:  Recent Labs Lab 06/15/17 0312  06/16/17 0200  06/17/17 0414 06/17/17 1537 06/18/17 0315 06/18/17 1624 06/19/17 0326  AST 52*  --  36  --  33  --  33  --  33  ALT 22  --  19  --  21  --  19  --  19  ALKPHOS 99  --  88  --  108  --  132*  --  110  BILITOT 3.2*  --  3.0*  --  1.6*  --  1.3*  --  1.5*  PROT 6.5  --  6.3*  --  6.6  --  7.0  --  6.5  ALBUMIN 2.9*  < > 2.7*  < > 2.7*  2.6* 2.7* 2.7* 2.5* 2.5*  < > = values in this interval not displayed. No results for input(s): LIPASE, AMYLASE in the last 168 hours. No results for input(s): AMMONIA in the last 168 hours.  CBC:  Recent Labs Lab 06/15/17 0312  06/16/17 0200 06/16/17 1140  06/17/17 0414 06/17/17 1526 06/18/17 0315 06/18/17 1626 06/19/17 0326  WBC 24.5*  --  20.1* 16.7*  --  14.7*  --  14.9*  --  14.1*  NEUTROABS 21.7*  --  17.3*  --   --  11.5*  --  10.3*  --  10.6*  HGB 8.8*  < > 8.1* 8.5*  < > 8.6* 8.8* 8.8* 9.2* 8.6*  HCT 25.9*  < > 22.7* 24.7*  < > 25.2* 26.0* 26.5* 27.0* 25.0*  MCV 77.8*  --  74.9* 76.2*  --   76.4*  --  77.9*  --  79.4  PLT 158  --  144* 164  --  219  --  254  --  275  < > = values in this interval not displayed.  INR:  Recent Labs Lab 06/16/17 0200 06/16/17 1554 06/17/17 0414 06/18/17 0315 06/19/17 0326  INR 1.93 2.59 2.59 1.99 2.35    Other results:  EKG:    Imaging   Dg Chest Port 1 View  Result Date: 06/19/2017 CLINICAL DATA:  51 year old male with weakness and sore chest. Subsequent encounter. EXAM: PORTABLE CHEST 1 VIEW COMPARISON:  06/18/2017. FINDINGS: Left ventricular assist device and AICD/pacer in place unchanged in position cardiomegaly. Coronary artery disease. Left chest tube removed without gross pneumothorax. Two right central lines are in place. Right internal jugular catheter tip mid superior vena cava level. Right subclavian line tip right atrium level. To be within the distal superior vena cava, this would need to be retracted by 5 cm. Asymmetric airspace disease greater left possibly representing asymmetric pulmonary edema minimally more notable than on prior exam. Slightly poor inspiration. Question left base subsegmental atelectasis. IMPRESSION: Cardiomegaly with left ventricular assist device in place. Right subclavian line tip right atrium level. To be within the distal superior vena cava, this would need to be retracted by 5 cm. Left chest tube removed without gross pneumothorax. Asymmetric airspace disease greater left possibly representing asymmetric pulmonary edema minimally more notable than on prior exam. Electronically Signed   By: Genia Del M.D.   On: 06/19/2017 07:28  Dg Chest Port 1 View  Result Date: 06/18/2017 CLINICAL DATA:  Chest tube, left ventricular assist device. EXAM: PORTABLE CHEST 1 VIEW COMPARISON:  Radiograph of June 17, 2017. FINDINGS: Stable cardiomediastinal silhouette. Left ventricular assistance device is unchanged in position. Left-sided pacemaker is also unchanged. Stable position of right internal jugular and  subclavian catheters. No pneumothorax is noted. Left-sided chest tube is unchanged in position. Stable bibasilar subsegmental atelectasis. Bony thorax is unremarkable. IMPRESSION: Stable support apparatus including left ventricular assistance device. Left-sided chest tube is unchanged without evidence pneumothorax. Stable bibasilar subsegmental listhesis. Electronically Signed   By: Marijo Conception, M.D.   On: 06/18/2017 07:12     Medications:     Scheduled Medications: . amiodarone  400 mg Oral BID  . aspirin EC  325 mg Oral Daily   Or  . aspirin  324 mg Per Tube Daily   Or  . aspirin  300 mg Rectal Daily  . atorvastatin  80 mg Oral q1800  . bisacodyl  10 mg Oral Daily   Or  . bisacodyl  10 mg Rectal Daily  . chlorhexidine  15 mL Mouth Rinse BID  . Chlorhexidine Gluconate Cloth  6 each Topical Daily  . cloNIDine  0.2 mg Transdermal Weekly  . darbepoetin (ARANESP) injection - DIALYSIS  100 mcg Intravenous Q Sat-HD  . docusate sodium  200 mg Oral Daily  . feeding supplement (ENSURE ENLIVE)  237 mL Oral TID WC  . insulin aspart  0-24 Units Subcutaneous Q4H  . mouth rinse  15 mL Mouth Rinse q12n4p  . metoCLOPramide (REGLAN) injection  10 mg Intravenous Q6H  . mupirocin ointment   Nasal BID  . pantoprazole  40 mg Oral Daily  . sodium chloride flush  10-40 mL Intracatheter Q12H  . sodium chloride flush  3 mL Intravenous Q12H  . Warfarin - Pharmacist Dosing Inpatient   Does not apply q1800    Infusions: . sodium chloride Stopped (06/14/17 1400)  . sodium chloride Stopped (06/13/17 1100)  . sodium chloride 10 mL/hr at 06/19/17 0800  . lactated ringers Stopped (06/14/17 1400)  . lactated ringers Stopped (06/15/17 1901)  . milrinone 0.375 mcg/kg/min (06/19/17 0800)  . nitroGLYCERIN Stopped (06/13/17 1700)  . piperacillin-tazobactam Stopped (06/19/17 0543)  . dialysis replacement fluid (prismasate) 200 mL/hr at 06/19/17 0050  . dialysis replacement fluid (prismasate) 300 mL/hr at  06/19/17 0531  . dialysate (PRISMASATE) 2,000 mL/hr at 06/19/17 0723  . sodium chloride      PRN Medications: sodium chloride, heparin, hydrALAZINE, LORazepam, ondansetron (ZOFRAN) IV, oxyCODONE, promethazine, sodium chloride, sodium chloride flush, sodium chloride flush, traMADol   Patient Profile   51 yo with history of CAD s/p anterior MI in 2010 and chronic systolic CHF (ischemic cardiomyopathy) as well as CKD stage III now s/p HeartMate 3 VAD placement.   Assessment/Plan:    1. Acute on chronic systolic CHF: Ischemic cardiomyopathy. St Jude ICD. NSTEMI in March 2018 with DES to LAD and RCA, complicated by cardiogenic shock, low output requiring milrinone. Most recent echo 8/18 with EF 20-25%, moderate MR. He was admitted and started on milrinone with improved creatinine.  HeartMate 3 VAD placement on 10/8. Speed cut back to 5400 with onset of afib/RVR 10/11.  Stable. MAP 70-80s.  - Remains on milrinone 0.375 mcg. CVP 7-8  - Hopefully coming off CVVH today.  - INR 2.35. INR goal 2-3 (aim slightly higher with h/o Factor V Leiden heterozygosity) and ASA 325. LDH stable.  - Favor sildenafil  for RV support after tapering off NO. Weaning NO today.  2. AKI on CKD stage 3: Now on CVVH. Hopefully coming off today. Will have to follow closely for recovery.  3. CAD: NSTEMI in 3/18. LHC with 99% ulcerated lesion proximal RCA with left to right collaterals, 95% mid LAD stenosis after mid LAD stent. s/p PCI to RCA and LAD on 11/25/16.  - Off plavix for LVAD.  - Continue statin.   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 Recent V/Q scan did not show acute or chronic PE. - He is a heterozygote for Factor V Leiden, plan for for ASA 325 and warfarin INR 2-3 with LVAD. No change.  5. Chronic N/V/D with gastroparesis: Tolerating diet.    6. DM: On Jardiance at home, will cover with sliding scale. Glucose stable.   7. S/P Multiple Tooth Extractions 4-15, 19-23, and 27-29.  8. Atrial  fibrillation: Paroxysmal.  Back in NSR.  Transition amiodarone to po today.     9. ID: CXR with LLL atelectasis versus infiltrate. Afebrile.  - WBC 14.  Empiric ABX with vanc and Zosyn.     Anticipate coming off CVVH today.   I reviewed the LVAD parameters from today, and compared the results to the patient's prior recorded data.  No programming changes were made.  The LVAD is functioning within specified parameters.  The patient performs LVAD self-test daily.  LVAD interrogation was negative for any significant power changes, alarms or PI events/speed drops.  LVAD equipment check completed and is in good working order.  Back-up equipment present.   LVAD education done on emergency procedures and precautions and reviewed exit site care.  Amy Clegg NP-C  8:18 AM  Patient seen with NP, agree with the above note.   CVP 7-8 today, weight down again with CVVH.  I think we are at goal volume at this point.  Will stop CVVH today and watch for renal recovery.  Will need IHD if UOP does not pick up.   Good co-ox.  Will stop NO today and will add sildenafil 20 mg tid for RV failure.  Continue current milrinone today, would start taper tomorrow.   INR therapeutic today.   Ambulate in hall.   Brazen Domangue 06/19/2017 1:47 PM

## 2017-06-19 NOTE — Progress Notes (Signed)
Existing VAD dressing removed and site care performed using sterile technique. Drive line exit site cleaned with Chlora prep applicators x 2, allowed to dry, and Gauze dressing with silver re-applied. Exit site healing well, Distal and proximal sutures intact, the velour is fully implanted at exit site. No redness, tenderness, drainage, foul odor or rash noted.    [name] RN, BSN VAD Coordinator Pager [phone] (7am - 7am)

## 2017-06-19 NOTE — Progress Notes (Signed)
LVAD Coordinator Rounding Note:  Admitted 06/07/2017 for hemodynamic optimization prior to LVAD placement. Full dental extractions prior to LVAD implant.  [id number] implated on 10/8/2018by Dr Craige Cotta Destination Therapycriteria due to renal insufficiency.  Vital signs: HR: 115 - sinus tach Doppler: 80 - correlating with MAP Auto cuff: 92/72 (80) O2 Sat: 98% on 2LNC NO: 4 PPM Wt:194>217>220>220>226>213>207>199lbs   LVAD interrogation reveals:   Speed: 5400 Flow: 4.4 Power: 3.9w PI: 3.1 Alarms: none Events: rare PI Hematocrit: 25 Fixed speed: 5400 Low speed limit: 5100   Drive Line: Daily. Change daily per protocol using daily kits and silver strip.   Labs:  LDH trend:255>343>379>376>366  INR trend: 1.44>1.23>1.44>1.47>1.93  Anticoagulation Plan: -INR Goal: 2-3, warfarin restarted 10/9  -ASA Dose: 330m (will remain on this with factor V heterozygosity)  Blood Products:  - 2 units FFP in OR - 1 unit PC in OR - 1 unit PRBC's 10/12  Gtts: - Milrinone 0.375 mcg/kg/min  Device: -St Jude  -Therapies: off. Perm pacer set at 60 bpm. -Temp pacer set AAI at 88 bpm, MA-5  Arrythmias:Atrial fib started 10/12. Amiodarone started 06/12/17 initially for ventricular arrhythmia   Respiratory: extubated 10/9   Renal:  Creat trend: 1.54>1.62>2.75>4.0>3.71>2.56>2.57>2.44>2.52 Oliguric renal failure with rising CVP. Trialysis cath placed 06/16/17 and CVVHD started goal 100 cc/hr removal for increased wt, CVP. Plan to stop CVVHD 06/19/17 and plan for intermittent hemodialysis while pt oliguric.   Adverse Events on VAD: -Oliguric renal failure post implant  Plan/Recommendations:   1. Please change drive-line dressing daily using daily dressing kits and silver strips. Continue family/caregiver education. Nurse demonstrated dressing change to pt's son over weekend and practiced sterile technique with donning gloves. Gloves sent home for  practice. VAD Coordinator will change dressing today and assess wound.  2.  Wean NO off today - will start Sildenafil 20 mg tid - will need prior auth pre-discharge per our PharmD when dose established.  3. Please page VAD pager with any equipment concerns.  MZada GirtRN, VAD Coordinator 24/7 pager [phone]

## 2017-06-19 NOTE — Progress Notes (Signed)
Patient ID: [name], male   DOB: 02/27/1966, 51 y.o.   MRN: [id number] S: Feels  Better today no complaints O:BP 92/72 (BP Location: Right Arm)   Pulse (!) 111   Temp 99.1 F (37.3 C) (Oral)   Resp (!) 22   Ht 5' 8"  (1.727 m)   Wt 90.3 kg (199 lb 1.2 oz)   SpO2 100%   BMI 30.27 kg/m   Intake/Output Summary (Last 24 hours) at 06/19/17 0851 Last data filed at 06/19/17 0834  Gross per 24 hour  Intake           3288.4 ml  Output             7324 ml  Net          -4035.6 ml   Intake/Output: I/O last 3 completed shifts: In: 4569.7 [P.O.:2395; I.V.:1222.7; IV Piggyback:952] Out: 99371 [Urine:70; Emesis/NG output:200; IRCVE:93810; Chest Tube:50]  Intake/Output this shift:  Total I/O In: 378.1 [P.O.:357; I.V.:21.1] Out: 290 [Other:290] Weight change: -3.8 kg (-8 lb 6 oz) Gen:WD NAD CVS: tachy and IRR IRR, no rub Resp:cta FBP:ZWCHEN Ext:no edema   Recent Labs Lab 06/13/17 0320  06/14/17 0310  06/15/17 0312  06/16/17 0200 06/16/17 1548  06/17/17 0414 06/17/17 1526 06/17/17 1537 06/18/17 0315 06/18/17 1624 06/18/17 1626 06/19/17 0326  NA 134*  < > 130*  < > 130*  < > 132* 135  < > 137  136 136 133* 137 136 137 135  K 3.9  < > 4.9  < > 4.4  < > 4.2 4.3  < > 4.2  5.0 5.7* 5.7* 3.6 4.4 4.4 4.3  CL 103  < > 97*  < > 95*  < > 96* 99*  < > 99*  99* 99* 99* 98* 100* 100* 99*  CO2 22  --  21*  --  23  < > 22 24  --  26  25  --  26 25 27   --  23  GLUCOSE 128*  < > 220*  < > 101*  < > 96 149*  < > 152*  148* 188* 175* 137* 116* 120* 125*  BUN 19  < > 25*  < > 32*  < > 29* 23*  < > 21*  20 26* 20 15 12 14 10   CREATININE 1.62*  < > 2.75*  < > 4.04*  < > 3.71* 3.03*  < > 2.57*  2.56* 2.50* 2.53* 2.44* 2.44* 2.40* 2.52*  ALBUMIN 3.4*  --  3.2*  --  2.9*  < > 2.7* 2.7*  --  2.7*  2.6*  --  2.7* 2.7* 2.5*  --  2.5*  CALCIUM 8.3*  --  8.4*  --  8.3*  < > 8.1* 8.0*  --  8.1*  8.0*  --  7.9* 8.2* 7.9*  --  8.0*  PHOS 3.1  --  5.9*  --  6.6*  < > 4.6 3.3  --  2.2*  --   1.8* 1.5* 2.0*  --  1.9*  AST 89*  --  78*  --  52*  --  36  --   --  33  --   --  33  --   --  33  ALT 24  --  24  --  22  --  19  --   --  21  --   --  19  --   --  19  < > = values in this  interval not displayed. Liver Function Tests:  Recent Labs Lab 06/17/17 0414  06/18/17 0315 06/18/17 1624 06/19/17 0326  AST 33  --  33  --  33  ALT 21  --  19  --  19  ALKPHOS 108  --  132*  --  110  BILITOT 1.6*  --  1.3*  --  1.5*  PROT 6.6  --  7.0  --  6.5  ALBUMIN 2.7*  2.6*  < > 2.7* 2.5* 2.5*  < > = values in this interval not displayed. No results for input(s): LIPASE, AMYLASE in the last 168 hours. No results for input(s): AMMONIA in the last 168 hours. CBC:  Recent Labs Lab 06/16/17 0200 06/16/17 1140  06/17/17 0414  06/18/17 0315 06/18/17 1626 06/19/17 0326  WBC 20.1* 16.7*  --  14.7*  --  14.9*  --  14.1*  NEUTROABS 17.3*  --   --  11.5*  --  10.3*  --  10.6*  HGB 8.1* 8.5*  < > 8.6*  < > 8.8* 9.2* 8.6*  HCT 22.7* 24.7*  < > 25.2*  < > 26.5* 27.0* 25.0*  MCV 74.9* 76.2*  --  76.4*  --  77.9*  --  79.4  PLT 144* 164  --  219  --  254  --  275  < > = values in this interval not displayed. Cardiac Enzymes:  Recent Labs Lab 06/15/17 1218  CKTOTAL 309   CBG:  Recent Labs Lab 06/18/17 0826 06/18/17 1151 06/18/17 1926 06/18/17 2309 06/19/17 0343  GLUCAP 134* 218* 115* 120* 119*    Iron Studies: No results for input(s): IRON, TIBC, TRANSFERRIN, FERRITIN in the last 72 hours. Studies/Results: Dg Chest Port 1 View  Result Date: 06/19/2017 CLINICAL DATA:  51 year old male with weakness and sore chest. Subsequent encounter. EXAM: PORTABLE CHEST 1 VIEW COMPARISON:  06/18/2017. FINDINGS: Left ventricular assist device and AICD/pacer in place unchanged in position cardiomegaly. Coronary artery disease. Left chest tube removed without gross pneumothorax. Two right central lines are in place. Right internal jugular catheter tip mid superior vena cava level. Right  subclavian line tip right atrium level. To be within the distal superior vena cava, this would need to be retracted by 5 cm. Asymmetric airspace disease greater left possibly representing asymmetric pulmonary edema minimally more notable than on prior exam. Slightly poor inspiration. Question left base subsegmental atelectasis. IMPRESSION: Cardiomegaly with left ventricular assist device in place. Right subclavian line tip right atrium level. To be within the distal superior vena cava, this would need to be retracted by 5 cm. Left chest tube removed without gross pneumothorax. Asymmetric airspace disease greater left possibly representing asymmetric pulmonary edema minimally more notable than on prior exam. Electronically Signed   By: Genia Del M.D.   On: 06/19/2017 07:28   Dg Chest Port 1 View  Result Date: 06/18/2017 CLINICAL DATA:  Chest tube, left ventricular assist device. EXAM: PORTABLE CHEST 1 VIEW COMPARISON:  Radiograph of June 17, 2017. FINDINGS: Stable cardiomediastinal silhouette. Left ventricular assistance device is unchanged in position. Left-sided pacemaker is also unchanged. Stable position of right internal jugular and subclavian catheters. No pneumothorax is noted. Left-sided chest tube is unchanged in position. Stable bibasilar subsegmental atelectasis. Bony thorax is unremarkable. IMPRESSION: Stable support apparatus including left ventricular assistance device. Left-sided chest tube is unchanged without evidence pneumothorax. Stable bibasilar subsegmental listhesis. Electronically Signed   By: Marijo Conception, M.D.   On: 06/18/2017 07:12   . amiodarone  400 mg Oral BID  . aspirin EC  325 mg Oral Daily   Or  . aspirin  324 mg Per Tube Daily   Or  . aspirin  300 mg Rectal Daily  . atorvastatin  80 mg Oral q1800  . bisacodyl  10 mg Oral Daily   Or  . bisacodyl  10 mg Rectal Daily  . chlorhexidine  15 mL Mouth Rinse BID  . Chlorhexidine Gluconate Cloth  6 each Topical Daily   . cloNIDine  0.2 mg Transdermal Weekly  . darbepoetin (ARANESP) injection - DIALYSIS  100 mcg Intravenous Q Sat-HD  . docusate sodium  200 mg Oral Daily  . feeding supplement (ENSURE ENLIVE)  237 mL Oral TID WC  . insulin aspart  0-24 Units Subcutaneous Q4H  . mouth rinse  15 mL Mouth Rinse q12n4p  . metoCLOPramide (REGLAN) injection  10 mg Intravenous Q6H  . mupirocin ointment   Nasal BID  . pantoprazole  40 mg Oral Daily  . sodium chloride flush  10-40 mL Intracatheter Q12H  . sodium chloride flush  3 mL Intravenous Q12H  . warfarin  2.5 mg Oral ONCE-1800  . Warfarin - Pharmacist Dosing Inpatient   Does not apply q1800    BMET    Component Value Date/Time   NA 135 06/19/2017 0326   NA 144 05/11/2017 0938   K 4.3 06/19/2017 0326   CL 99 (L) 06/19/2017 0326   CO2 23 06/19/2017 0326   GLUCOSE 125 (H) 06/19/2017 0326   BUN 10 06/19/2017 0326   BUN 68 (H) 05/11/2017 0938   CREATININE 2.52 (H) 06/19/2017 0326   CREATININE 1.63 (H) 11/09/2016 1505   CALCIUM 8.0 (L) 06/19/2017 0326   GFRNONAA 28 (L) 06/19/2017 0326   GFRNONAA 48 (L) 11/09/2016 1505   GFRAA 32 (L) 06/19/2017 0326   GFRAA 55 (L) 11/09/2016 1505   CBC    Component Value Date/Time   WBC 14.1 (H) 06/19/2017 0326   RBC 3.15 (L) 06/19/2017 0326   HGB 8.6 (L) 06/19/2017 0326   HGB 11.7 (L) 09/07/2016 1457   HCT 25.0 (L) 06/19/2017 0326   HCT 37.5 09/07/2016 1457   PLT 275 06/19/2017 0326   PLT 279 09/07/2016 1457   MCV 79.4 06/19/2017 0326   MCV 81 09/07/2016 1457   MCH 27.3 06/19/2017 0326   MCHC 34.4 06/19/2017 0326   RDW 17.8 (H) 06/19/2017 0326   RDW 14.9 09/07/2016 1457   LYMPHSABS 1.6 06/19/2017 0326   LYMPHSABS 1.3 09/07/2016 1457   MONOABS 1.8 (H) 06/19/2017 0326   EOSABS 0.1 06/19/2017 0326   EOSABS 0.2 09/07/2016 1457   BASOSABS 0.0 06/19/2017 0326   BASOSABS 0.0 09/07/2016 1457     Assessment/Plan:  1. AKI/CKD oliguric/anuric in setting of cardiogenic shock and +/- vanc toxicity.  Has  tolerated CVVHD since 06/15/17 with marked improvement in volume (net negative 7 liters over the last 3 days).  Discussed with Dr. Darcey Nora and will stop CVVHD today and plan for IHD while he remains oliguric.  BP's have improved and is now on anti-hypertensive agents 2. 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   3. CAD s/p NSTEMI 3/18.  Off plavix due to LVAD 4. H/o DVT's on coumadin  5. DM- per primary svc 6. ABLA/Anemia of CKD- follow H/H, on Aranesp.  Transfuse prn 7. A fib- per Cardiology, on amiodarone.    Donetta Potts, MD [employer] [phone]

## 2017-06-19 NOTE — Progress Notes (Signed)
Walked with pt and physical therapy while pt on nitric. Pt stable throughout and no complications. VS within normal limits.

## 2017-06-19 NOTE — Progress Notes (Signed)
CSW attempted to visit patient at bedside this afternoon although sound asleep. CSW did not want to wake. VAD Coordinator plans to complete dressing change later and will see if patient is interested in attending LVAD Support Group tonight. CSW will continue to follow throughout implant hospitalization. Raquel Sarna, [place], [place]

## 2017-06-19 NOTE — Progress Notes (Signed)
Physical Therapy Treatment Patient Details Name: [name] MRN: [id number] DOB: 04/11/1966 Today's Date: 06/19/2017    History of Present Illness 51 yo with history of CAD s/p anterior MI in 2010 and chronic systolic CHF (ischemic cardiomyopathy) as well as CKD stage III now s/p HeartMate 3 VAD placement.  PMH includes:  ASCVD,  CHF, Chron's disease, DVT, gastroparesis; HTN, ischemic cardiomyopathy DM; s/p cardiac debriblator placement.     PT Comments    Pt began managing LVAD and switching batteries however this was very fatiguing for him. It limited out ambulation distance this date. However pt was also being weaned off of nitric oxide. Pt with improved cognition and con't to be motivated to participate in PT. Acute PT to con't to follow.    Follow Up Recommendations  Home health PT;Supervision/Assistance - 24 hour     Equipment Recommendations  Other (comment)    Recommendations for Other Services       Precautions / Restrictions Precautions Precautions: Fall;Sternal Precaution Comments: LVAD, Nitric oxide Restrictions Weight Bearing Restrictions: Yes (sternal precautions)    Mobility  Bed Mobility Overal bed mobility: Needs Assistance Bed Mobility: Rolling;Sidelying to Sit Rolling: Min assist Sidelying to sit: Min assist       General bed mobility comments: v/c's for technique to adhere to sternal precautions, mina for trunk elevation  Transfers Overall transfer level: Needs assistance Equipment used: 1 person hand held assist Transfers: Sit to/from Stand Sit to Stand: Min assist         General transfer comment: pt hugged heart pillow, rocked and pushed up with bilat LEs  Ambulation/Gait Ambulation/Gait assistance: Min assist Ambulation Distance (Feet): 50 Feet (x3) Assistive device: Rolling walker (2 wheeled) Gait Pattern/deviations: Step-through pattern;Wide base of support (decreased step height) Gait velocity: slow Gait velocity  interpretation: Below normal speed for age/gender General Gait Details: pt quickly fatigued this date requiring 3 seated rest breaks. pt reports "i'm just exhausted today" Pt did manage LVAD equip for the first time today and is weaning of nitric oxide which could be factors of increased fatigue.   Stairs            Wheelchair Mobility    Modified Rankin (Stroke Patients Only)       Balance Overall balance assessment: Needs assistance Sitting-balance support: No upper extremity supported;Feet supported Sitting balance-[name]: Good Sitting balance - Comments: pt able to place batteries in holster without LOB or difficulty   Standing balance support: Bilateral upper extremity supported Standing balance-[name]: Poor Standing balance comment: Requires Bil UE support to stand but light support only                             Cognition Arousal/Alertness: Awake/alert Behavior During Therapy: WFL for tasks assessed/performed Overall Cognitive Status: Within Functional Limits for tasks assessed                                        Exercises      General Comments General comments (skin integrity, edema, etc.): worked with patient on managing own LVAD. with v/c's pt able to switch over from wall power to battery power and vice versa. was able to perform self test on controller. discussed what should be in his black bag when he goes somewhere (ie. extra batteries, clips, and controller). pt worked on placing batteries in  holster      Pertinent Vitals/Pain Pain Assessment: 0-10 Pain Score: 5  Pain Location: chest Pain Descriptors / Indicators: Grimacing Pain Intervention(s): Monitored during session    Home Living                      Prior Function            PT Goals (current goals can now be found in the care plan section) Progress towards PT goals: Progressing toward goals    Frequency    Min 3X/week      PT  Plan Current plan remains appropriate    Co-evaluation              AM-PAC PT "6 Clicks" Daily Activity  Outcome Measure  Difficulty turning over in bed (including adjusting bedclothes, sheets and blankets)?: A Lot Difficulty moving from lying on back to sitting on the side of the bed? : A Lot Difficulty sitting down on and standing up from a chair with arms (e.g., wheelchair, bedside commode, etc,.)?: A Little Help needed moving to and from a bed to chair (including a wheelchair)?: A Little Help needed walking in hospital room?: A Little Help needed climbing 3-5 steps with a railing? : Total 6 Click Score: 14    End of Session Equipment Utilized During Treatment: Oxygen Activity Tolerance: Patient limited by fatigue Patient left: in chair;with call bell/phone within reach;with nursing/sitter in room Nurse Communication: Mobility status PT Visit Diagnosis: Unsteadiness on feet (R26.81);Muscle weakness (generalized) (M62.81)     Time: 0940-7680 PT Time Calculation (min) (ACUTE ONLY): 43 min  Charges:  $Gait Training: 23-37 mins $Therapeutic Activity: 8-22 mins                    G Codes:       Kittie Plater, PT, DPT Pager #: [phone] Office #: [phone]    [place] 06/19/2017, 2:07 PM

## 2017-06-20 ENCOUNTER — Other Ambulatory Visit: Payer: Self-pay | Admitting: Internal Medicine

## 2017-06-20 ENCOUNTER — Other Ambulatory Visit (HOSPITAL_COMMUNITY): Payer: Self-pay | Admitting: Cardiology

## 2017-06-20 ENCOUNTER — Inpatient Hospital Stay (HOSPITAL_COMMUNITY): Payer: BLUE CROSS/BLUE SHIELD

## 2017-06-20 LAB — COMPREHENSIVE METABOLIC PANEL
ALT: 15 U/L — ABNORMAL LOW (ref 17–63)
AST: 25 U/L (ref 15–41)
Albumin: 2.2 g/dL — ABNORMAL LOW (ref 3.5–5.0)
Alkaline Phosphatase: 113 U/L (ref 38–126)
Anion gap: 13 (ref 5–15)
BUN: 20 mg/dL (ref 6–20)
CO2: 23 mmol/L (ref 22–32)
Calcium: 7.8 mg/dL — ABNORMAL LOW (ref 8.9–10.3)
Chloride: 93 mmol/L — ABNORMAL LOW (ref 101–111)
Creatinine, Ser: 4.52 mg/dL — ABNORMAL HIGH (ref 0.61–1.24)
GFR calc Af Amer: 16 mL/min — ABNORMAL LOW (ref >60)
GFR calc non Af Amer: 14 mL/min — ABNORMAL LOW (ref >60)
Glucose, Bld: 144 mg/dL — ABNORMAL HIGH (ref 65–99)
Potassium: 3.7 mmol/L (ref 3.5–5.1)
Sodium: 129 mmol/L — ABNORMAL LOW (ref 135–145)
Total Bilirubin: 1 mg/dL (ref 0.3–1.2)
Total Protein: 6.2 g/dL — ABNORMAL LOW (ref 6.5–8.1)

## 2017-06-20 LAB — CBC
HCT: 24.2 % — ABNORMAL LOW (ref 39.0–52.0)
Hemoglobin: 8.1 g/dL — ABNORMAL LOW (ref 13.0–17.0)
MCH: 26.4 pg (ref 26.0–34.0)
MCHC: 33.5 g/dL (ref 30.0–36.0)
MCV: 78.8 fL (ref 78.0–100.0)
Platelets: 321 10*3/uL (ref 150–400)
RBC: 3.07 MIL/uL — ABNORMAL LOW (ref 4.22–5.81)
RDW: 16.9 % — ABNORMAL HIGH (ref 11.5–15.5)
WBC: 16 10*3/uL — ABNORMAL HIGH (ref 4.0–10.5)

## 2017-06-20 LAB — GLUCOSE, CAPILLARY
GLUCOSE-CAPILLARY: 116 mg/dL — AB (ref 65–99)
GLUCOSE-CAPILLARY: 129 mg/dL — AB (ref 65–99)
GLUCOSE-CAPILLARY: 143 mg/dL — AB (ref 65–99)
Glucose-Capillary: 103 mg/dL — ABNORMAL HIGH (ref 65–99)
Glucose-Capillary: 245 mg/dL — ABNORMAL HIGH (ref 65–99)
Glucose-Capillary: 309 mg/dL — ABNORMAL HIGH (ref 65–99)

## 2017-06-20 LAB — MAGNESIUM: Magnesium: 2.2 mg/dL (ref 1.7–2.4)

## 2017-06-20 LAB — COOXEMETRY PANEL
Carboxyhemoglobin: 1.1 % (ref 0.5–1.5)
Methemoglobin: 1.6 % — ABNORMAL HIGH (ref 0.0–1.5)
O2 Saturation: 64.9 %
Total hemoglobin: 8 g/dL — ABNORMAL LOW (ref 12.0–16.0)

## 2017-06-20 LAB — PHOSPHORUS: Phosphorus: 2.6 mg/dL (ref 2.5–4.6)

## 2017-06-20 LAB — PROTIME-INR
INR: 2.41
PROTHROMBIN TIME: 26 s — AB (ref 11.4–15.2)

## 2017-06-20 LAB — LACTATE DEHYDROGENASE: LDH: 314 U/L — AB (ref 98–192)

## 2017-06-20 MED ORDER — PIPERACILLIN-TAZOBACTAM 3.375 G IVPB
3.3750 g | Freq: Two times a day (BID) | INTRAVENOUS | Status: DC
  Administered 2017-06-20 – 2017-06-23 (×6): 3.375 g via INTRAVENOUS
  Filled 2017-06-20 (×6): qty 50

## 2017-06-20 MED ORDER — ASPIRIN 81 MG PO CHEW: 324.0000 mg | CHEWABLE_TABLET | Freq: Every day | ORAL | Status: DC

## 2017-06-20 MED ORDER — ASPIRIN 300 MG RE SUPP: 150.0000 mg | Freq: Every day | RECTAL | Status: DC

## 2017-06-20 MED ORDER — ASPIRIN 81 MG PO CHEW: 81.0000 mg | CHEWABLE_TABLET | Freq: Every day | ORAL | Status: DC

## 2017-06-20 MED ORDER — ASPIRIN 300 MG RE SUPP
300.0000 mg | Freq: Every day | RECTAL | Status: DC
  Filled 2017-06-20: qty 1

## 2017-06-20 MED ORDER — HEPARIN SODIUM (PORCINE) 1000 UNIT/ML DIALYSIS
20.0000 [IU]/kg | INTRAMUSCULAR | Status: DC | PRN
  Administered 2017-06-20: 1800 [IU] via INTRAVENOUS_CENTRAL

## 2017-06-20 MED ORDER — ASPIRIN EC 81 MG PO TBEC: 81.0000 mg | DELAYED_RELEASE_TABLET | Freq: Every day | ORAL | Status: DC

## 2017-06-20 MED ORDER — ASPIRIN EC 325 MG PO TBEC
325.0000 mg | DELAYED_RELEASE_TABLET | Freq: Every day | ORAL | Status: DC
  Administered 2017-06-20 – 2017-07-03 (×14): 325 mg via ORAL
  Filled 2017-06-20 (×15): qty 1

## 2017-06-20 MED ORDER — WARFARIN SODIUM 2.5 MG PO TABS
2.5000 mg | ORAL_TABLET | Freq: Once | ORAL | Status: AC
  Administered 2017-06-20: 2.5 mg via ORAL
  Filled 2017-06-20: qty 1

## 2017-06-20 NOTE — Progress Notes (Signed)
Patient ID: [name], male   DOB: 1966-04-09, 51 y.o.   MRN: [id number]   Advanced Heart Failure VAD Team Note  Subjective:    Admitted 10/3 for RHC/Swan implantation and optimization prior to LVAD.  HeartMate 3 VAD implanted 10/8.  CVVH begun 10/11 CVVH stopped 10/15  Remains on milrinone 0.375 mcg. Todays CO-OX is 65%. Yesterday CVVH stopped. Creatinine up to 4.5. 30 cc urine output over night. MAP 90s.   Hgb 8.1.  LDH 354 => 314.   Denies SOB. Denies pain. Walked around the unit this morning.   LVAD INTERROGATION:  HeartMate 3:  Flow 4.1  liters/min, speed 5400 rpm, power 4, PI  3.2   Rare PI events.    Objective:    Vital Signs:   Temp:  [97.9 F (36.6 C)-99.2 F (37.3 C)] 97.9 F (36.6 C) (10/16 0312) Pulse Rate:  [96-115] 98 (10/16 0600) Resp:  [10-30] 23 (10/16 0600) BP: (82-112)/(62-88) 97/67 (10/16 0400) SpO2:  [90 %-100 %] 100 % (10/16 0600) Weight:  [198 lb 6.4 oz (90 kg)] 198 lb 6.4 oz (90 kg) (10/16 0500) Last BM Date: 06/19/17 Mean arterial Pressure 90s  Intake/Output:   Intake/Output Summary (Last 24 hours) at 06/20/17 0730 Last data filed at 06/20/17 0400  Gross per 24 hour  Intake           2654.1 ml  Output              580 ml  Net           2074.1 ml     Physical Exam: CVP 12 General:  Well appearing. No resp difficulty. Sitting in the chair.  HEENT: normal Neck: supple. no JVD. Carotids 2+ bilat; no bruits. No lymphadenopathy or thryomegaly appreciated.RIJ HD catheter Cor: PMI nondisplaced. Regular rate & rhythm. No rubs, gallops or murmurs. Lungs: clear on 2 liters oxygen.  Abdomen: soft, nontender, nondistended. No hepatosplenomegaly. No bruits or masses. Good bowel sounds. Extremities: no cyanosis, clubbing, rash, edema Neuro: alert & orientedx3, cranial nerves grossly intact. moves all 4 extremities w/o difficulty. Affect pleasant GU: foley   Telemetry   Sinus Tach 100s   Labs   Basic Metabolic Panel:  Recent Labs Lab  06/16/17 0200  06/17/17 0414  06/18/17 0315 06/18/17 1624 06/18/17 1626 06/19/17 0326 06/19/17 1207 06/20/17 0351  NA 132*  < > 137  136  < > 137 136 137 135 131* 129*  K 4.2  < > 4.2  5.0  < > 3.6 4.4 4.4 4.3 4.1 3.7  CL 96*  < > 99*  99*  < > 98* 100* 100* 99* 97* 93*  CO2 22  < > 26  25  < > 25 27  --  23 25 23   GLUCOSE 96  < > 152*  148*  < > 137* 116* 120* 125* 365* 144*  BUN 29*  < > 21*  20  < > 15 12 14 10 15 20   CREATININE 3.71*  < > 2.57*  2.56*  < > 2.44* 2.44* 2.40* 2.52* 2.93* 4.52*  CALCIUM 8.1*  < > 8.1*  8.0*  < > 8.2* 7.9*  --  8.0* 7.7* 7.8*  MG 2.4  --  2.6*  --  2.4  --   --  2.4  --  2.2  PHOS 4.6  < > 2.2*  < > 1.5* 2.0*  --  1.9* 2.0* 2.6  < > = values in this interval not displayed.  Liver Function Tests:  Recent Labs Lab 06/16/17 0200  06/17/17 0414  06/18/17 0315 06/18/17 1624 06/19/17 0326 06/19/17 1207 06/20/17 0351  AST 36  --  33  --  33  --  33  --  25  ALT 19  --  21  --  19  --  19  --  15*  ALKPHOS 88  --  108  --  132*  --  110  --  113  BILITOT 3.0*  --  1.6*  --  1.3*  --  1.5*  --  1.0  PROT 6.3*  --  6.6  --  7.0  --  6.5  --  6.2*  ALBUMIN 2.7*  < > 2.7*  2.6*  < > 2.7* 2.5* 2.5* 2.4* 2.2*  < > = values in this interval not displayed. No results for input(s): LIPASE, AMYLASE in the last 168 hours. No results for input(s): AMMONIA in the last 168 hours.  CBC:  Recent Labs Lab 06/15/17 0312  06/16/17 0200  06/17/17 0414  06/18/17 0315 06/18/17 1626 06/19/17 0326 06/19/17 1207 06/20/17 0351  WBC 24.5*  --  20.1*  < > 14.7*  --  14.9*  --  14.1* 14.4* 16.0*  NEUTROABS 21.7*  --  17.3*  --  11.5*  --  10.3*  --  10.6*  --   --   HGB 8.8*  < > 8.1*  < > 8.6*  < > 8.8* 9.2* 8.6* 8.6* 8.1*  HCT 25.9*  < > 22.7*  < > 25.2*  < > 26.5* 27.0* 25.0* 26.3* 24.2*  MCV 77.8*  --  74.9*  < > 76.4*  --  77.9*  --  79.4 79.5 78.8  PLT 158  --  144*  < > 219  --  254  --  275 292 321  < > = values in this interval not  displayed.  INR:  Recent Labs Lab 06/16/17 1554 06/17/17 0414 06/18/17 0315 06/19/17 0326 06/20/17 0351  INR 2.59 2.59 1.99 2.35 2.41    Other results:  EKG:    Imaging   Dg Chest Port 1 View  Result Date: 06/19/2017 CLINICAL DATA:  51 year old male with weakness and sore chest. Subsequent encounter. EXAM: PORTABLE CHEST 1 VIEW COMPARISON:  06/18/2017. FINDINGS: Left ventricular assist device and AICD/pacer in place unchanged in position cardiomegaly. Coronary artery disease. Left chest tube removed without gross pneumothorax. Two right central lines are in place. Right internal jugular catheter tip mid superior vena cava level. Right subclavian line tip right atrium level. To be within the distal superior vena cava, this would need to be retracted by 5 cm. Asymmetric airspace disease greater left possibly representing asymmetric pulmonary edema minimally more notable than on prior exam. Slightly poor inspiration. Question left base subsegmental atelectasis. IMPRESSION: Cardiomegaly with left ventricular assist device in place. Right subclavian line tip right atrium level. To be within the distal superior vena cava, this would need to be retracted by 5 cm. Left chest tube removed without gross pneumothorax. Asymmetric airspace disease greater left possibly representing asymmetric pulmonary edema minimally more notable than on prior exam. Electronically Signed   By: Genia Del M.D.   On: 06/19/2017 07:28     Medications:     Scheduled Medications: . amiodarone  400 mg Oral BID  . aspirin EC  325 mg Oral Daily   Or  . aspirin  324 mg Per Tube Daily   Or  . aspirin  300  mg Rectal Daily  . atorvastatin  80 mg Oral q1800  . bisacodyl  10 mg Oral Daily   Or  . bisacodyl  10 mg Rectal Daily  . chlorhexidine  15 mL Mouth Rinse BID  . Chlorhexidine Gluconate Cloth  6 each Topical Daily  . cloNIDine  0.2 mg Transdermal Weekly  . darbepoetin (ARANESP) injection - DIALYSIS  100  mcg Intravenous Q Sat-HD  . docusate sodium  200 mg Oral Daily  . feeding supplement (ENSURE ENLIVE)  237 mL Oral TID WC  . insulin aspart  0-24 Units Subcutaneous Q4H  . mouth rinse  15 mL Mouth Rinse q12n4p  . metoCLOPramide (REGLAN) injection  10 mg Intravenous Q6H  . mupirocin ointment   Nasal BID  . pantoprazole  40 mg Oral Daily  . sildenafil  20 mg Oral TID  . sodium chloride flush  10-40 mL Intracatheter Q12H  . sodium chloride flush  3 mL Intravenous Q12H  . Warfarin - Pharmacist Dosing Inpatient   Does not apply q1800    Infusions: . sodium chloride Stopped (06/14/17 1400)  . sodium chloride Stopped (06/13/17 1100)  . sodium chloride 10 mL/hr at 06/20/17 0604  . lactated ringers Stopped (06/14/17 1400)  . lactated ringers Stopped (06/15/17 1901)  . milrinone 0.375 mcg/kg/min (06/20/17 0604)  . nitroGLYCERIN Stopped (06/13/17 1700)  . piperacillin-tazobactam (ZOSYN)  IV Stopped (06/20/17 0708)    PRN Medications: sodium chloride, hydrALAZINE, LORazepam, ondansetron (ZOFRAN) IV, oxyCODONE, promethazine, sodium chloride flush, sodium chloride flush, traMADol   Patient Profile   51 yo with history of CAD s/p anterior MI in 2010 and chronic systolic CHF (ischemic cardiomyopathy) as well as CKD stage III now s/p HeartMate 3 VAD placement.   Assessment/Plan:    1. Acute on chronic systolic CHF: Ischemic cardiomyopathy. St Jude ICD. NSTEMI in March 2018 with DES to LAD and RCA, complicated by cardiogenic shock, low output requiring milrinone. Most recent echo 8/18 with EF 20-25%, moderate MR. He was admitted and started on milrinone with improved creatinine.  HeartMate 3 VAD placement on 10/8. Speed cut back to 5400 with onset of afib/RVR 10/11.  CVP 12, rising slowly with poor UOP. MAP 90s today.  Co-ox 65%.  - Continue milrinone 0.375 mcg/kg/min. Per Dr Prescott Gum no wean for now.  - INR 2.4. INR Goal 2-3.  (aim slightly higher with h/o Factor V Leiden heterozygosity) and  ASA 325. Will not lower aspirin dose with Factor V Leiden.  - Started on sildenafil for RV support.  - Mildly elevated MAP, will not treat currently, want to avoid hypotension with first iHD run today.  - Will need eventual ramp echo.  2. AKI on CKD stage 3: CVVH stopped 10/15. Creatinine trending up 4.5 with minimal UOP. To start iHD today.  3. CAD: NSTEMI in 3/18. LHC with 99% ulcerated lesion proximal RCA with left to right collaterals, 95% mid LAD stenosis after mid LAD stent. s/p PCI to RCA and LAD on 11/25/16.  - Off plavix for LVAD.  - Continue statin.   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 Recent V/Q scan did not show acute or chronic PE.  He is a heterozygote for Factor V Leiden, plan for for ASA 325 and warfarin INR 2-3 with LVAD.  INR 2.41 5. Chronic N/V/D with gastroparesis: Tolerating diet.    6. DM: On Jardiance at home, will cover with sliding scale. Glucose stable.   7. S/P Multiple Tooth Extractions 4-15, 19-23, and 27-29.  8. Atrial fibrillation: Paroxysmal. Maintaining NSR. Continue amio 400 mg twice a day.      9. ID: CXR with LLL atelectasis versus infiltrate. Afebrile.  - WBC 16. He has now completed course of vancomycin/Zosyn.   10. Hyponatremia: sodium 129. Watch. BMET in am.   I reviewed the LVAD parameters from today, and compared the results to the patient's prior recorded data.  No programming changes were made.  The LVAD is functioning within specified parameters.  The patient performs LVAD self-test daily.  LVAD interrogation was negative for any significant power changes, alarms or PI events/speed drops.  LVAD equipment check completed and is in good working order.  Back-up equipment present.   LVAD education done on emergency procedures and precautions and reviewed exit site care.  Amy Clegg NP-C  7:30 AM   Patient seen with NP, agree with the above note.  I independently evaluated the patient and performed physical exam.  I adjusted the above note to  reflect my thoughts.   Patient is now off CVVH but minimal UOP and creatinine rising to 4.5.  CVP 12.  It appears that he will need intermittent HD at least temporarily.  MAP 90s, will hold off on tighter BP control until we see how MAP tolerates HD.    Discussed with Dr Prescott Gum. Co-ox is good at 65% but will leave milrinone in place for now pending renal function recovery.    Now off NO and on sildenafil for RV function.   Prior to discharge, he will need ramp echo.   INR therapeutic, off heparin and on warfarin/ASA 325.  Of note, he is heterozygous for FVL.   He is now off abx, afebrile.   CRITICAL CARE Performed by: Loralie Champagne  Total critical care time: 35 minutes  Critical care time was exclusive of separately billable procedures and treating other patients.  Critical care was necessary to treat or prevent imminent or life-threatening deterioration.  Critical care was time spent personally by me on the following activities: development of treatment plan with patient and/or surrogate as well as nursing, discussions with consultants, evaluation of patient's response to treatment, examination of patient, obtaining history from patient or surrogate, ordering and performing treatments and interventions, ordering and review of laboratory studies, ordering and review of radiographic studies, pulse oximetry and re-evaluation of patient's condition.  Najeeb Uptain 06/20/2017 8:15 AM

## 2017-06-20 NOTE — Progress Notes (Signed)
Occupational Therapy Treatment Patient Details Name: [name] MRN: [id number] DOB: October 25, 1965 Today's Date: 06/20/2017    History of present illness 51 yo with history of CAD s/p anterior MI in 2010 and chronic systolic CHF (ischemic cardiomyopathy) as well as CKD stage III now s/p HeartMate 3 VAD placement.  PMH includes:  ASCVD,  CHF, Chron's disease, DVT, gastroparesis; HTN, ischemic cardiomyopathy DM; s/p cardiac debriblator placement.    OT comments  Pt fatigued this pm after HD, but was agreeable to working with OT.  He is able to perform LB ADLs with mod A.   He was able to verbalize management of VAD equipment independently.  Requires min A for functional mobility - ambulated 18f before fatiguing.    Follow Up Recommendations  Home health OT;Supervision/Assistance - 24 hour    Equipment Recommendations  3 in 1 bedside commode    Recommendations for Other Services      Precautions / Restrictions Precautions Precautions: Fall;Sternal Precaution Comments: LVAD, Nitric oxide       Mobility Bed Mobility Overal bed mobility: Needs Assistance Bed Mobility: Rolling;Sidelying to Sit;Sit to Sidelying Rolling: Min assist Sidelying to sit: Min assist     Sit to sidelying: Min assist General bed mobility comments: Pt requires assist to fully roll to Rt.  Assist to lift trunk from bed and to lift LEs back onto bed when moving to supine   Transfers Overall transfer level: Needs assistance Equipment used: Rolling walker (2 wheeled) Transfers: Sit to/from SOmnicareSit to Stand: Min assist;+2 safety/equipment Stand pivot transfers: Min assist;+2 safety/equipment       General transfer comment: Pt requires assist to move into standing and to steady.  Second person for line management     Balance Overall balance assessment: Needs assistance Sitting-balance support: No upper extremity supported;Feet supported Sitting balance-[name]:  Good Sitting balance - Comments: able to don/doff socks    Standing balance support: Bilateral upper extremity supported Standing balance-[name]: Poor                             ADL either performed or assessed with clinical judgement   ADL Overall ADL's : Needs assistance/impaired                     Lower Body Dressing: Moderate assistance;Sit to/from stand Lower Body Dressing Details (indicate cue type and reason): able to don/doff socks by crossling ankles over knees  Toilet Transfer: Minimal assistance;+2 for safety/equipment;Ambulation;Comfort height toilet;BSC;RW   Toileting- Clothing Manipulation and Hygiene: Maximal assistance;Sit to/from stand       Functional mobility during ADLs: Minimal assistance;+2 for safety/equipment;Rolling walker       Vision       Perception     Praxis      Cognition Arousal/Alertness: Awake/alert Behavior During Therapy: WFL for tasks assessed/performed Overall Cognitive Status: Within Functional Limits for tasks assessed                                          Exercises     Shoulder Instructions       General Comments Pt very fatigued today after HD.   He was too tired [facility], but was able to instruct student, who was observing, in his VAD equipment, including purpose, precautions, how to  switch from AC<> battery,  etc.  He was able to ambualate 20 ft with min A +2 for safety before fatiguing     Pertinent Vitals/ Pain       Pain Assessment: Faces Faces Pain Scale: Hurts little more Pain Location: chest Pain Descriptors / Indicators: Grimacing Pain Intervention(s): Monitored during session  Home Living                                          Prior Functioning/Environment              Frequency  Min 3X/week        Progress Toward Goals  OT Goals(current goals can now be found in the care plan section)  Progress towards OT  goals: Progressing toward goals     Plan Discharge plan remains appropriate    Co-evaluation                 AM-PAC PT "6 Clicks" Daily Activity     Outcome Measure   Help from another person eating meals?: A Little Help from another person taking care of personal grooming?: A Little Help from another person toileting, which includes using toliet, bedpan, or urinal?: A Lot Help from another person bathing (including washing, rinsing, drying)?: A Lot Help from another person to put on and taking off regular upper body clothing?: A Lot Help from another person to put on and taking off regular lower body clothing?: A Lot 6 Click Score: 14    End of Session Equipment Utilized During Treatment: Oxygen  OT Visit Diagnosis: Unsteadiness on feet (R26.81);Pain   Activity Tolerance Patient limited by fatigue   Patient Left in bed;with call bell/phone within reach   Nurse Communication Mobility status        Time: 7741-2878 OT Time Calculation (min): 42 min  Charges: OT General Charges $OT Visit: 1 Visit OT Treatments $Self Care/Home Management : 8-22 mins $Therapeutic Activity: 23-37 mins  [facility], OTR/L [phone]    Lucille Passy M 06/20/2017, 5:02 PM

## 2017-06-20 NOTE — Progress Notes (Signed)
[place] for Coumadin (asked by Dr. Prescott Gum for pharmacy to dose starting 10/13) Indication: LVAD, h/o DVT Factor V Leiden heterozygosity  Allergies  Allergen Reactions  . Metformin And Related Diarrhea    Patient Measurements: Height: 5' 8"  (172.7 cm) Weight: 198 lb 6.4 oz (90 kg) IBW/kg (Calculated) : 68.4   Vital Signs: Temp: 97.9 F (36.6 C) (10/16 0312) Temp Source: Axillary (10/16 0312) BP: 97/67 (10/16 0400) Pulse Rate: 98 (10/16 0600)  Labs:  Recent Labs  06/17/17 1700  06/18/17 0315  06/19/17 0326 06/19/17 1207 06/20/17 0351  HGB  --   --  8.8*  < > 8.6* 8.6* 8.1*  HCT  --   --  26.5*  < > 25.0* 26.3* 24.2*  PLT  --   < > 254  --  275 292 321  APTT 71*  --   --   --   --   --   --   LABPROT  --   --  22.4*  --  25.5*  --  26.0*  INR  --   --  1.99  --  2.35  --  2.41  CREATININE  --   --  2.44*  < > 2.52* 2.93* 4.52*  < > = values in this interval not displayed.  Estimated Creatinine Clearance: 20.8 mL/min (A) (by C-G formula based on SCr of 4.52 mg/dL (H)).   Medical History: Past Medical History:  Diagnosis Date  . Angina   . ASCVD (arteriosclerotic cardiovascular disease)    , Anterior infarction 2005, LAD diagonal bifurcation intervention 03/2004  . Automatic implantable cardiac defibrillator -St. Jude's       . Benign neoplasm of colon   . CHF (congestive heart failure) ([place])   . Chronic systolic heart failure ([place])   . Coronary artery disease     Widely patent previously placed stents in the left anterior   . Crohn's disease ([place])   . 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 Dyspnea   . Gastroparesis   . GERD (gastroesophageal reflux disease)   . High cholesterol   . Hyperlipidemia   . Hypersomnolent    Previous diagnosis of narcolepsy  . Hypertension, essential   . Ischemic cardiomyopathy    Ejection fraction 15-20% catheterization 2010  . Type II or unspecified type diabetes  mellitus without mention of complication, not stated as uncontrolled   . Unspecified gastritis and gastroduodenitis without mention of hemorrhage     Assessment: 66 yoM on chronic Coumadin for Factor 5 Leiden and hx DVTs admitted for LVAD placement. INR therapeutic at 2.41, CBC stable, no issues per RN. Plan per HF team and TCTS is INR goal 2-3 and full dose ASA at discharge.  Goal of Therapy:  INR 2-3 Monitor platelets by anticoagulation protocol: Yes   Plan:  -Coumadin 2.5 mg x 1 tonight -Daily PT/INR.  Arrie Senate, PharmD PGY-2 Cardiology Pharmacy Resident Pager: [phone] 06/20/2017

## 2017-06-20 NOTE — Progress Notes (Signed)
HeartMate 3 Rounding Note   postop day #8  Subjective:    HeartMate 3 implantation October 8 with preoperative ischemic cardiomyopathy, advanced heart failure on IV inotropes, indication was destination therapy  Patient stable, extubated  postop day 1.No bleeding.Started on low dose heparin bridge to coumadin    Acute on chronic renal failure with wt gain, increased volume, increased pulsatility- VAD speed up to 5600- now back to 5400 rpm with less PI events  Volume overload improved with CRT.   Walking in hall  Oliguric Acute renal failure with rising cvp- trialysis cath placed 10-11 and CRT started goal 100 -200cc/hr removal for increased wt, cvp With wt and CVP normal will transition to intermittent HD and follow renal recovery  Hx factor V Leiden deficiency. Now  with po coumadin-  INR  Goal2.5, dose per pharmD  Minimal urine output after CRT stopped with creat up to 4.2 Intermittent HD     stop clonidine  LVAD INTERROGATION:  HeartMate IIILVAD:  Flow 4.2 liters/min, speed 5400, power 4.0, PI 3.0.  Controller Serial intact.   Objective:    Vital Signs:   Temp:  [97.9 F (36.6 C)-99.2 F (37.3 C)] 97.9 F (36.6 C) (10/16 0312) Pulse Rate:  [96-115] 98 (10/16 0600) Resp:  [10-30] 23 (10/16 0600) BP: (82-112)/(62-88) 97/67 (10/16 0400) SpO2:  [90 %-100 %] 100 % (10/16 0600) Weight:  [198 lb 6.4 oz (90 kg)] 198 lb 6.4 oz (90 kg) (10/16 0500) Last BM Date: 06/19/17 Mean arterial Pressure 85 mmHg  Intake/Output:   Intake/Output Summary (Last 24 hours) at 06/20/17 0737 Last data filed at 06/20/17 0400  Gross per 24 hour  Intake           2654.1 ml  Output              580 ml  Net           2074.1 ml     Physical Exam: General:  Well appearing. No resp difficulty HEENT: normal Neck: supple. JVP .; no bruits. No lymphadenopathy or thryomegaly appreciated. Cor: Mechanical heart sounds with LVAD hum present. Lungs: clear Abdomen: soft, nontender, nondistended. No  hepatosplenomegaly. No bruits or masses. Good bowel sounds. Extremities: no cyanosis, clubbing, rash, mild edema of the extremities edema Neuro: alert & orientedx3, cranial nerves grossly intact. moves all 4 extremities w/o difficulty. Affect pleasant  Telemetry: A paced at 88/m  Labs: Basic Metabolic Panel:  Recent Labs Lab 06/16/17 0200  06/17/17 0414  06/18/17 0315 06/18/17 1624 06/18/17 1626 06/19/17 0326 06/19/17 1207 06/20/17 0351  NA 132*  < > 137  136  < > 137 136 137 135 131* 129*  K 4.2  < > 4.2  5.0  < > 3.6 4.4 4.4 4.3 4.1 3.7  CL 96*  < > 99*  99*  < > 98* 100* 100* 99* 97* 93*  CO2 22  < > 26  25  < > 25 27  --  23 25 23   GLUCOSE 96  < > 152*  148*  < > 137* 116* 120* 125* 365* 144*  BUN 29*  < > 21*  20  < > 15 12 14 10 15 20   CREATININE 3.71*  < > 2.57*  2.56*  < > 2.44* 2.44* 2.40* 2.52* 2.93* 4.52*  CALCIUM 8.1*  < > 8.1*  8.0*  < > 8.2* 7.9*  --  8.0* 7.7* 7.8*  MG 2.4  --  2.6*  --  2.4  --   --  2.4  --  2.2  PHOS 4.6  < > 2.2*  < > 1.5* 2.0*  --  1.9* 2.0* 2.6  < > = values in this interval not displayed.  Liver Function Tests:  Recent Labs Lab 06/16/17 0200  06/17/17 0414  06/18/17 0315 06/18/17 1624 06/19/17 0326 06/19/17 1207 06/20/17 0351  AST 36  --  33  --  33  --  33  --  25  ALT 19  --  21  --  19  --  19  --  15*  ALKPHOS 88  --  108  --  132*  --  110  --  113  BILITOT 3.0*  --  1.6*  --  1.3*  --  1.5*  --  1.0  PROT 6.3*  --  6.6  --  7.0  --  6.5  --  6.2*  ALBUMIN 2.7*  < > 2.7*  2.6*  < > 2.7* 2.5* 2.5* 2.4* 2.2*  < > = values in this interval not displayed. No results for input(s): LIPASE, AMYLASE in the last 168 hours. No results for input(s): AMMONIA in the last 168 hours.  CBC:  Recent Labs Lab 06/15/17 0312  06/16/17 0200  06/17/17 0414  06/18/17 0315 06/18/17 1626 06/19/17 0326 06/19/17 1207 06/20/17 0351  WBC 24.5*  --  20.1*  < > 14.7*  --  14.9*  --  14.1* 14.4* 16.0*  NEUTROABS 21.7*  --  17.3*   --  11.5*  --  10.3*  --  10.6*  --   --   HGB 8.8*  < > 8.1*  < > 8.6*  < > 8.8* 9.2* 8.6* 8.6* 8.1*  HCT 25.9*  < > 22.7*  < > 25.2*  < > 26.5* 27.0* 25.0* 26.3* 24.2*  MCV 77.8*  --  74.9*  < > 76.4*  --  77.9*  --  79.4 79.5 78.8  PLT 158  --  144*  < > 219  --  254  --  275 292 321  < > = values in this interval not displayed.  INR:  Recent Labs Lab 06/16/17 1554 06/17/17 0414 06/18/17 0315 06/19/17 0326 06/20/17 0351  INR 2.59 2.59 1.99 2.35 2.41    Other results:  EKG:   Imaging: Dg Chest Port 1 View  Result Date: 06/19/2017 CLINICAL DATA:  51 year old male with weakness and sore chest. Subsequent encounter. EXAM: PORTABLE CHEST 1 VIEW COMPARISON:  06/18/2017. FINDINGS: Left ventricular assist device and AICD/pacer in place unchanged in position cardiomegaly. Coronary artery disease. Left chest tube removed without gross pneumothorax. Two right central lines are in place. Right internal jugular catheter tip mid superior vena cava level. Right subclavian line tip right atrium level. To be within the distal superior vena cava, this would need to be retracted by 5 cm. Asymmetric airspace disease greater left possibly representing asymmetric pulmonary edema minimally more notable than on prior exam. Slightly poor inspiration. Question left base subsegmental atelectasis. IMPRESSION: Cardiomegaly with left ventricular assist device in place. Right subclavian line tip right atrium level. To be within the distal superior vena cava, this would need to be retracted by 5 cm. Left chest tube removed without gross pneumothorax. Asymmetric airspace disease greater left possibly representing asymmetric pulmonary edema minimally more notable than on prior exam. Electronically Signed   By: Genia Del M.D.   On: 06/19/2017 07:28     Medications:     Scheduled Medications: . amiodarone  400 mg  Oral BID  . aspirin EC  325 mg Oral Daily   Or  . aspirin  324 mg Per Tube Daily   Or  .  aspirin  300 mg Rectal Daily  . atorvastatin  80 mg Oral q1800  . bisacodyl  10 mg Oral Daily   Or  . bisacodyl  10 mg Rectal Daily  . chlorhexidine  15 mL Mouth Rinse BID  . Chlorhexidine Gluconate Cloth  6 each Topical Daily  . darbepoetin (ARANESP) injection - DIALYSIS  100 mcg Intravenous Q Sat-HD  . docusate sodium  200 mg Oral Daily  . feeding supplement (ENSURE ENLIVE)  237 mL Oral TID WC  . insulin aspart  0-24 Units Subcutaneous Q4H  . mouth rinse  15 mL Mouth Rinse q12n4p  . metoCLOPramide (REGLAN) injection  10 mg Intravenous Q6H  . mupirocin ointment   Nasal BID  . pantoprazole  40 mg Oral Daily  . sildenafil  20 mg Oral TID  . sodium chloride flush  10-40 mL Intracatheter Q12H  . sodium chloride flush  3 mL Intravenous Q12H  . Warfarin - Pharmacist Dosing Inpatient   Does not apply q1800    Infusions: . sodium chloride Stopped (06/14/17 1400)  . sodium chloride Stopped (06/13/17 1100)  . sodium chloride 10 mL/hr at 06/20/17 0604  . lactated ringers Stopped (06/14/17 1400)  . lactated ringers Stopped (06/15/17 1901)  . milrinone 0.375 mcg/kg/min (06/20/17 0604)  . nitroGLYCERIN Stopped (06/13/17 1700)  . piperacillin-tazobactam (ZOSYN)  IV Stopped (06/20/17 0708)    PRN Medications: sodium chloride, hydrALAZINE, LORazepam, ondansetron (ZOFRAN) IV, oxyCODONE, promethazine, sodium chloride flush, sodium chloride flush, traMADol   Assessment:  HeartMate 3 VAD implant for ischemic cardiomyopathy Preoperative ventricular ectopy with permanent pacemaker-AICD Preoperative moderate chronic kidney disease stage III Preoperative hemicolectomy for granulomatous colitis Poorly controlled diabetes mellitus with hemoglobin A1c 10.0, neuropathy Peripheral vascular disease with ABIs 0.5 bilaterally  postoperative acute renal failure with rising creatinine 4.2 and oliguria- HD cath placed and CRT applied 10-12 - 10-15 Postop atrial fib on amiodarone Excellent VAD performance  postop  Plan/Discussion:      Continue 0.375 milrinone for RV function until renal fx recovers    HD today   NSR - on po amiodarone   evidence of acute on chronic renal insufficiency with low urine output and fluid retention and poor response to Lasix - CRT started10-11 and  stopped 10-15   All chest drains out - incisions clean, dry  Adv diet to mech soft Cont coumadin, 325 ASA I reviewed the LVAD parameters from today, and compared the results to the patient's prior recorded data.  No programming changes were made.  The LVAD is functioning within specified parameters.  The patient performs LVAD self-test daily.  LVAD interrogation was negative for any significant power changes, alarms or PI events/speed drops.  LVAD equipment check completed and is in good working order.  Back-up equipment present.   LVAD education done on emergency procedures and precautions and reviewed exit site care.  Length of Stay: [place] III 06/20/2017, 7:37 AM

## 2017-06-20 NOTE — Progress Notes (Signed)
Pharmacy Antibiotic Note  [name] is a 51 y.o. male admitted on 06/07/2017 with surgical prophylaxis.   Renal function worsened and WBC rose to antibiotics broadened. Pt placed on CRRT with minimal UOP and now transitioning to iHD. Pt remains on Zosyn, vancomycin now off.  Plan: -Adjust Zosyn to 3.375g IV q12h with HD -Monitor renal funx, HD plans   Height: 5' 8"  (172.7 cm) Weight: 198 lb 10.2 oz (90.1 kg) IBW/kg (Calculated) : 68.4  Temp (24hrs), Avg:98.4 F (36.9 C), Min:97.9 F (36.6 C), Max:99.2 F (37.3 C)   Recent Labs Lab 06/17/17 0414  06/18/17 0315 06/18/17 1624 06/18/17 1626 06/19/17 0326 06/19/17 1207 06/20/17 0351  WBC 14.7*  --  14.9*  --   --  14.1* 14.4* 16.0*  CREATININE 2.57*  2.56*  < > 2.44* 2.44* 2.40* 2.52* 2.93* 4.52*  VANCORANDOM 36  --  23  --   --  26  --   --   < > = values in this interval not displayed.  Estimated Creatinine Clearance: 20.8 mL/min (A) (by C-G formula based on SCr of 4.52 mg/dL (H)).    Allergies  Allergen Reactions  . Metformin And Related Diarrhea    Antimicrobials this admission: Vancomycin 10/8 >> 10/15 Cefuroxime 10/8 >>10/10 Zosyn 10/10 >>  Dose adjustments this admission: 10/10: SCr up> vancomycin to q12h 10/11: SCr up > hold vancomycin 10/11 VR 59 > hold vancomycin 10/13 VR 36 > continue to hold vancomycin 10/14 VR 23 > redose w/ 1g today 10/15 VR 26 > stopping vancomycin today  Microbiology results: 10/5 MRSA PCR: neg  Thank you for allowing pharmacy to be a part of this patient's care.  Arrie Senate, PharmD PGY-2 Cardiology Pharmacy Resident Pager: [phone] 06/20/2017

## 2017-06-20 NOTE — Progress Notes (Signed)
LVAD Coordinator Rounding Note:  Admitted 06/07/2017 for hemodynamic optimization prior to LVAD placement. Full dental extractions prior to LVAD implant.  HM3LVAD implated on 10/8/2018by Dr Craige Cotta Destination Therapycriteria due to renal insufficiency.  Vital signs: HR: 102 - afib Doppler: 96  Auto cuff: 104/78 (85) O2 Sat: 98% on 2LNC NO: off 06/19/17 Wt:194>217>220>220>226>213>207>199>198lbs   LVAD interrogation reveals:   Speed: 5400 Flow: 3.9 Power: 3.8w PI: 4.9 Alarms: none Events: rare PI Hematocrit: 24 Fixed speed: 5400 Low speed limit: 5100   Drive Line: Daily. Change daily per protocol using daily kits and silver strip. Donnetta Simpers, RN to change with [name] today.   Labs:  LDH trend:255>343>379>376>366>314  INR trend: 1.44>1.23>1.44>1.47>1.93>2.41  Anticoagulation Plan: -INR Goal: 2-3, warfarin restarted 10/9  -ASA Dose: 328m (will remain on this with factor V heterozygosity)  Blood Products:  - 2 units FFP in OR - 1 unit PC in OR - 1 unit PRBC's 10/12  Gtts: - Milrinone 0.375 mcg/kg/min  Device: -St Jude  -Therapies: off. Perm pacer set at 60 bpm. -Temp pacer set AAI at 88 bpm, MA-5  Arrythmias: Atrial fib started 10/12. Amiodarone started 06/12/17 initially for ventricular arrhythmia   Respiratory: extubated 10/9   Renal:  Creat trend: 1.54>1.62>2.75>4.0>3.71>2.56>2.57>2.44>2.52>4.52 Oliguric renal failure with rising CVP. Trialysis cath placed 06/16/17 and CVVHD started goal 100 cc/hr removal for increased wt, CVP. Plan to stop CVVHD 06/19/17 and plan for intermittent hemodialysis while pt oliguric.   Adverse Events on VAD: -Oliguric renal failure post implant  Plan/Recommendations:   1. Please change dressing with the family today. 2. Pt will have HD today. 3. Please wean O2 when able.   STanda RockersRN, VAD Coordinator 24/7 pager [phone]

## 2017-06-20 NOTE — Progress Notes (Signed)
Patient ID: [name], male   DOB: 11/09/1965, 51 y.o.   MRN: [id number] S:Feels well no complaints O:BP 104/78 (BP Location: Left Arm)   Pulse 99   Temp 98.2 F (36.8 C) (Oral)   Resp 17   Ht 5' 8"  (1.727 m)   Wt 90 kg (198 lb 6.4 oz)   SpO2 100%   BMI 30.17 kg/m   Intake/Output Summary (Last 24 hours) at 06/20/17 0843 Last data filed at 06/20/17 0800  Gross per 24 hour  Intake           2287.1 ml  Output              290 ml  Net           1997.1 ml   Intake/Output: I/O last 3 completed shifts: In: 4052.3 [P.O.:3059; I.V.:743.3; IV Piggyback:250] Out: 9211 [Urine:50; Other:4322]  Intake/Output this shift:  Total I/O In: 11.1 [I.V.:11.1] Out: -  Weight change: -0.306 kg (-10.8 oz) Gen: NAD CVS: mechanical hum Resp: cta HER:DEYCXK Ext: no edema   Recent Labs Lab 06/14/17 0310  06/15/17 0312  06/16/17 0200  06/17/17 0414  06/17/17 1537 06/18/17 0315 06/18/17 1624 06/18/17 1626 06/19/17 0326 06/19/17 1207 06/20/17 0351  NA 130*  < > 130*  < > 132*  < > 137  136  < > 133* 137 136 137 135 131* 129*  K 4.9  < > 4.4  < > 4.2  < > 4.2  5.0  < > 5.7* 3.6 4.4 4.4 4.3 4.1 3.7  CL 97*  < > 95*  < > 96*  < > 99*  99*  < > 99* 98* 100* 100* 99* 97* 93*  CO2 21*  --  23  < > 22  < > 26  25  --  26 25 27   --  23 25 23   GLUCOSE 220*  < > 101*  < > 96  < > 152*  148*  < > 175* 137* 116* 120* 125* 365* 144*  BUN 25*  < > 32*  < > 29*  < > 21*  20  < > 20 15 12 14 10 15 20   CREATININE 2.75*  < > 4.04*  < > 3.71*  < > 2.57*  2.56*  < > 2.53* 2.44* 2.44* 2.40* 2.52* 2.93* 4.52*  ALBUMIN 3.2*  --  2.9*  < > 2.7*  < > 2.7*  2.6*  --  2.7* 2.7* 2.5*  --  2.5* 2.4* 2.2*  CALCIUM 8.4*  --  8.3*  < > 8.1*  < > 8.1*  8.0*  --  7.9* 8.2* 7.9*  --  8.0* 7.7* 7.8*  PHOS 5.9*  --  6.6*  < > 4.6  < > 2.2*  --  1.8* 1.5* 2.0*  --  1.9* 2.0* 2.6  AST 78*  --  52*  --  36  --  33  --   --  33  --   --  33  --  25  ALT 24  --  22  --  19  --  21  --   --  19  --   --  19  --   15*  < > = values in this interval not displayed. Liver Function Tests:  Recent Labs Lab 06/18/17 0315  06/19/17 0326 06/19/17 1207 06/20/17 0351  AST 33  --  33  --  25  ALT 19  --  19  --  15*  ALKPHOS 132*  --  110  --  113  BILITOT 1.3*  --  1.5*  --  1.0  PROT 7.0  --  6.5  --  6.2*  ALBUMIN 2.7*  < > 2.5* 2.4* 2.2*  < > = values in this interval not displayed. No results for input(s): LIPASE, AMYLASE in the last 168 hours. No results for input(s): AMMONIA in the last 168 hours. CBC:  Recent Labs Lab 06/17/17 0414  06/18/17 0315  06/19/17 0326 06/19/17 1207 06/20/17 0351  WBC 14.7*  --  14.9*  --  14.1* 14.4* 16.0*  NEUTROABS 11.5*  --  10.3*  --  10.6*  --   --   HGB 8.6*  < > 8.8*  < > 8.6* 8.6* 8.1*  HCT 25.2*  < > 26.5*  < > 25.0* 26.3* 24.2*  MCV 76.4*  --  77.9*  --  79.4 79.5 78.8  PLT 219  --  254  --  275 292 321  < > = values in this interval not displayed. Cardiac Enzymes:  Recent Labs Lab 06/15/17 1218  CKTOTAL 309   CBG:  Recent Labs Lab 06/19/17 1631 06/19/17 1953 06/19/17 2259 06/20/17 0402 06/20/17 0803  GLUCAP 68 191* 151* 143* 129*    Iron Studies: No results for input(s): IRON, TIBC, TRANSFERRIN, FERRITIN in the last 72 hours. Studies/Results: Dg Chest Port 1 View  Result Date: 06/20/2017 CLINICAL DATA:  Weakness EXAM: PORTABLE CHEST 1 VIEW COMPARISON:  06/19/2017 FINDINGS: Defibrillator, right jugular dialysis catheter, right subclavian central line and left ventricular assist device are again noted and stable. Cardiac shadow remains enlarged. Postsurgical changes are again seen. The lungs are well aerated bilaterally with patchy opacities particularly in the left mid and lower lung stable from the prior exam. No pneumothorax is noted. IMPRESSION: Tubes and lines as described. Patchy opacities within the left lung stable from the prior study. Electronically Signed   By: Inez Catalina M.D.   On: 06/20/2017 08:21   Dg Chest Port 1  View  Result Date: 06/19/2017 CLINICAL DATA:  51 year old male with weakness and sore chest. Subsequent encounter. EXAM: PORTABLE CHEST 1 VIEW COMPARISON:  06/18/2017. FINDINGS: Left ventricular assist device and AICD/pacer in place unchanged in position cardiomegaly. Coronary artery disease. Left chest tube removed without gross pneumothorax. Two right central lines are in place. Right internal jugular catheter tip mid superior vena cava level. Right subclavian line tip right atrium level. To be within the distal superior vena cava, this would need to be retracted by 5 cm. Asymmetric airspace disease greater left possibly representing asymmetric pulmonary edema minimally more notable than on prior exam. Slightly poor inspiration. Question left base subsegmental atelectasis. IMPRESSION: Cardiomegaly with left ventricular assist device in place. Right subclavian line tip right atrium level. To be within the distal superior vena cava, this would need to be retracted by 5 cm. Left chest tube removed without gross pneumothorax. Asymmetric airspace disease greater left possibly representing asymmetric pulmonary edema minimally more notable than on prior exam. Electronically Signed   By: Genia Del M.D.   On: 06/19/2017 07:28   . amiodarone  400 mg Oral BID  . aspirin EC  325 mg Oral Daily   Or  . aspirin  324 mg Per Tube Daily   Or  . aspirin  300 mg Rectal Daily  . atorvastatin  80 mg Oral q1800  . bisacodyl  10 mg Oral Daily   Or  . bisacodyl  10 mg Rectal Daily  . chlorhexidine  15 mL Mouth Rinse BID  . Chlorhexidine Gluconate Cloth  6 each Topical Daily  . darbepoetin (ARANESP) injection - DIALYSIS  100 mcg Intravenous Q Sat-HD  . docusate sodium  200 mg Oral Daily  . feeding supplement (ENSURE ENLIVE)  237 mL Oral TID WC  . insulin aspart  0-24 Units Subcutaneous Q4H  . mouth rinse  15 mL Mouth Rinse q12n4p  . metoCLOPramide (REGLAN) injection  10 mg Intravenous Q6H  . mupirocin ointment    Nasal BID  . pantoprazole  40 mg Oral Daily  . sildenafil  20 mg Oral TID  . sodium chloride flush  10-40 mL Intracatheter Q12H  . sodium chloride flush  3 mL Intravenous Q12H  . warfarin  2.5 mg Oral ONCE-1800  . Warfarin - Pharmacist Dosing Inpatient   Does not apply q1800    BMET    Component Value Date/Time   NA 129 (L) 06/20/2017 0351   NA 144 05/11/2017 0938   K 3.7 06/20/2017 0351   CL 93 (L) 06/20/2017 0351   CO2 23 06/20/2017 0351   GLUCOSE 144 (H) 06/20/2017 0351   BUN 20 06/20/2017 0351   BUN 68 (H) 05/11/2017 0938   CREATININE 4.52 (H) 06/20/2017 0351   CREATININE 1.63 (H) 11/09/2016 1505   CALCIUM 7.8 (L) 06/20/2017 0351   GFRNONAA 14 (L) 06/20/2017 0351   GFRNONAA 48 (L) 11/09/2016 1505   GFRAA 16 (L) 06/20/2017 0351   GFRAA 55 (L) 11/09/2016 1505   CBC    Component Value Date/Time   WBC 16.0 (H) 06/20/2017 0351   RBC 3.07 (L) 06/20/2017 0351   HGB 8.1 (L) 06/20/2017 0351   HGB 11.7 (L) 09/07/2016 1457   HCT 24.2 (L) 06/20/2017 0351   HCT 37.5 09/07/2016 1457   PLT 321 06/20/2017 0351   PLT 279 09/07/2016 1457   MCV 78.8 06/20/2017 0351   MCV 81 09/07/2016 1457   MCH 26.4 06/20/2017 0351   MCHC 33.5 06/20/2017 0351   RDW 16.9 (H) 06/20/2017 0351   RDW 14.9 09/07/2016 1457   LYMPHSABS 1.6 06/19/2017 0326   LYMPHSABS 1.3 09/07/2016 1457   MONOABS 1.8 (H) 06/19/2017 0326   EOSABS 0.1 06/19/2017 0326   EOSABS 0.2 09/07/2016 1457   BASOSABS 0.0 06/19/2017 0326   BASOSABS 0.0 09/07/2016 1457    Assessment/Plan:  1. AKI/CKD oliguric/anuric in setting of cardiogenic shock and +/- vanc toxicity.  Has tolerated CVVHD since 06/15/17 with marked improvement in volume (net negative 7 liters over a 3 day period) and stopped 06/19/17. Unfortunately he has remained anuric so will plan for IHD starting today and follow UOP.  BP's have improved and is now on anti-hypertensive agents 2. Vascular access- will likely need tunneled HD catheter and AVF/AVG if he  remains dialysis-dependent 3. 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   4. CAD s/p NSTEMI 3/18.  Off plavix due to LVAD 5. H/o DVT's on coumadin  6. DM- per primary svc 7. ABLA/Anemia of CKD- follow H/H, on Aranesp.  Transfuse prn 8. A fib- per Cardiology, on amiodarone.  Donetta Potts, MD [employer] [phone]

## 2017-06-21 ENCOUNTER — Encounter (HOSPITAL_COMMUNITY): Payer: BLUE CROSS/BLUE SHIELD

## 2017-06-21 LAB — PROTIME-INR
INR: 2.13
Prothrombin Time: 23.6 seconds — ABNORMAL HIGH (ref 11.4–15.2)

## 2017-06-21 LAB — COMPREHENSIVE METABOLIC PANEL
ALT: 17 U/L (ref 17–63)
AST: 29 U/L (ref 15–41)
Albumin: 2.2 g/dL — ABNORMAL LOW (ref 3.5–5.0)
Alkaline Phosphatase: 124 U/L (ref 38–126)
Anion gap: 13 (ref 5–15)
BUN: 17 mg/dL (ref 6–20)
CO2: 23 mmol/L (ref 22–32)
Calcium: 7.7 mg/dL — ABNORMAL LOW (ref 8.9–10.3)
Chloride: 91 mmol/L — ABNORMAL LOW (ref 101–111)
Creatinine, Ser: 4.04 mg/dL — ABNORMAL HIGH (ref 0.61–1.24)
GFR calc Af Amer: 18 mL/min — ABNORMAL LOW (ref >60)
GFR calc non Af Amer: 16 mL/min — ABNORMAL LOW (ref >60)
Glucose, Bld: 148 mg/dL — ABNORMAL HIGH (ref 65–99)
Potassium: 3.9 mmol/L (ref 3.5–5.1)
Sodium: 127 mmol/L — ABNORMAL LOW (ref 135–145)
Total Bilirubin: 1 mg/dL (ref 0.3–1.2)
Total Protein: 6.2 g/dL — ABNORMAL LOW (ref 6.5–8.1)

## 2017-06-21 LAB — CBC
HCT: 24.3 % — ABNORMAL LOW (ref 39.0–52.0)
Hemoglobin: 8 g/dL — ABNORMAL LOW (ref 13.0–17.0)
MCH: 26 pg (ref 26.0–34.0)
MCHC: 32.9 g/dL (ref 30.0–36.0)
MCV: 78.9 fL (ref 78.0–100.0)
Platelets: 336 10*3/uL (ref 150–400)
RBC: 3.08 MIL/uL — ABNORMAL LOW (ref 4.22–5.81)
RDW: 17 % — ABNORMAL HIGH (ref 11.5–15.5)
WBC: 17.6 10*3/uL — ABNORMAL HIGH (ref 4.0–10.5)

## 2017-06-21 LAB — COOXEMETRY PANEL
Carboxyhemoglobin: 1.2 % (ref 0.5–1.5)
Methemoglobin: 1.4 % (ref 0.0–1.5)
O2 Saturation: 61 %
Total hemoglobin: 7.8 g/dL — ABNORMAL LOW (ref 12.0–16.0)

## 2017-06-21 LAB — GLUCOSE, CAPILLARY
GLUCOSE-CAPILLARY: 130 mg/dL — AB (ref 65–99)
GLUCOSE-CAPILLARY: 147 mg/dL — AB (ref 65–99)
GLUCOSE-CAPILLARY: 165 mg/dL — AB (ref 65–99)
Glucose-Capillary: 147 mg/dL — ABNORMAL HIGH (ref 65–99)
Glucose-Capillary: 237 mg/dL — ABNORMAL HIGH (ref 65–99)
Glucose-Capillary: 211 mg/dL — ABNORMAL HIGH (ref 65–99)

## 2017-06-21 LAB — MAGNESIUM: MAGNESIUM: 1.9 mg/dL (ref 1.7–2.4)

## 2017-06-21 LAB — LACTATE DEHYDROGENASE: LDH: 304 U/L — ABNORMAL HIGH (ref 98–192)

## 2017-06-21 LAB — PHOSPHORUS: Phosphorus: 1.8 mg/dL — ABNORMAL LOW (ref 2.5–4.6)

## 2017-06-21 LAB — HEPATITIS B SURFACE ANTIGEN: Hepatitis B Surface Ag: NEGATIVE

## 2017-06-21 MED ORDER — WARFARIN SODIUM 5 MG PO TABS
5.0000 mg | ORAL_TABLET | Freq: Once | ORAL | Status: AC
  Administered 2017-06-21: 5 mg via ORAL
  Filled 2017-06-21: qty 1

## 2017-06-21 MED ORDER — GLUCERNA SHAKE PO LIQD
237.0000 mL | Freq: Three times a day (TID) | ORAL | Status: DC
  Administered 2017-06-21 – 2017-07-03 (×28): 237 mL via ORAL
  Filled 2017-06-21: qty 237

## 2017-06-21 MED ORDER — FUROSEMIDE 10 MG/ML IJ SOLN
160.0000 mg | Freq: Once | INTRAVENOUS | Status: AC
  Administered 2017-06-21: 160 mg via INTRAVENOUS
  Filled 2017-06-21: qty 16

## 2017-06-21 NOTE — Progress Notes (Signed)
Patient ID: [name], male   DOB: Nov 18, 1965, 51 y.o.   MRN: [id number]   Advanced Heart Failure VAD Team Note  Subjective:    Admitted 10/3 for RHC/Swan implantation and optimization prior to LVAD.  HeartMate 3 VAD implanted 10/8.  CVVH begun 10/11 CVVH stopped 10/15 Started iHD 10/17  Hgb 8.0 .  LDH 301  Remains on milrinone on 0.375 mcg. No urine output. Walked around the nursing unit x2.   Denies pain. Denies N/V.   LVAD INTERROGATION:  HeartMate 3:  Flow 4.3  liters/min, speed 5400 rpm, power 4, PI  3.5     Objective:    Vital Signs:   Temp:  [98.2 F (36.8 C)-99.1 F (37.3 C)] 98.3 F (36.8 C) (10/17 0300) Pulse Rate:  [99-114] 99 (10/17 0600) Resp:  [1-27] 23 (10/17 0700) BP: (71-135)/(56-95) 93/79 (10/17 0700) SpO2:  [93 %-100 %] 96 % (10/17 0600) Weight:  [198 lb 10.2 oz (90.1 kg)-199 lb 3.2 oz (90.4 kg)] 199 lb 3.2 oz (90.4 kg) (10/17 0700) Last BM Date: 06/20/17 Mean arterial Pressure 70-80s   Intake/Output:   Intake/Output Summary (Last 24 hours) at 06/21/17 0741 Last data filed at 06/21/17 0600  Gross per 24 hour  Intake           1249.4 ml  Output             2000 ml  Net           -750.6 ml     CVP 10  Physical Exam: GENERAL: NAD. Sitting in the chair.  HEENT: normal  NECK: Supple, JVP 9-10.  2+ bilaterally, no bruits.  No lymphadenopathy or thyromegaly appreciated.  RIJ HD catheter CARDIAC:  Mechanical heart sounds with LVAD hum present.  LUNGS:  Clear to auscultation bilaterally.  ABDOMEN:  Soft, round, nontender, positive bowel sounds x4.     LVAD exit site:  Dressing dry and intact.  No erythema or drainage.  Stabilization device present and accurately applied.  Driveline dressing is being changed daily per sterile technique. EXTREMITIES:  Warm and dry, no cyanosis, clubbing, rash or edema  NEUROLOGIC:  Alert and oriented x 4.  Gait steady.  No aphasia.  No dysarthria.  Affect pleasant.      Telemetry   SR -ST 90-100s    Labs     Basic Metabolic Panel:  Recent Labs Lab 06/17/17 0414  06/18/17 0315 06/18/17 1624 06/18/17 1626 06/19/17 0326 06/19/17 1207 06/20/17 0351 06/21/17 0215  NA 137  136  < > 137 136 137 135 131* 129* 127*  K 4.2  5.0  < > 3.6 4.4 4.4 4.3 4.1 3.7 3.9  CL 99*  99*  < > 98* 100* 100* 99* 97* 93* 91*  CO2 26  25  < > 25 27  --  23 25 23 23   GLUCOSE 152*  148*  < > 137* 116* 120* 125* 365* 144* 148*  BUN 21*  20  < > 15 12 14 10 15 20 17   CREATININE 2.57*  2.56*  < > 2.44* 2.44* 2.40* 2.52* 2.93* 4.52* 4.04*  CALCIUM 8.1*  8.0*  < > 8.2* 7.9*  --  8.0* 7.7* 7.8* 7.7*  MG 2.6*  --  2.4  --   --  2.4  --  2.2 1.9  PHOS 2.2*  < > 1.5* 2.0*  --  1.9* 2.0* 2.6 1.8*  < > = values in this interval not displayed.  Liver Function Tests:  Recent Labs Lab 06/17/17 0414  06/18/17 0315 06/18/17 1624 06/19/17 0326 06/19/17 1207 06/20/17 0351 06/21/17 0215  AST 33  --  33  --  33  --  25 29  ALT 21  --  19  --  19  --  15* 17  ALKPHOS 108  --  132*  --  110  --  113 124  BILITOT 1.6*  --  1.3*  --  1.5*  --  1.0 1.0  PROT 6.6  --  7.0  --  6.5  --  6.2* 6.2*  ALBUMIN 2.7*  2.6*  < > 2.7* 2.5* 2.5* 2.4* 2.2* 2.2*  < > = values in this interval not displayed. No results for input(s): LIPASE, AMYLASE in the last 168 hours. No results for input(s): AMMONIA in the last 168 hours.  CBC:  Recent Labs Lab 06/15/17 0312  06/16/17 0200  06/17/17 0414  06/18/17 0315 06/18/17 1626 06/19/17 0326 06/19/17 1207 06/20/17 0351 06/21/17 0215  WBC 24.5*  --  20.1*  < > 14.7*  --  14.9*  --  14.1* 14.4* 16.0* 17.6*  NEUTROABS 21.7*  --  17.3*  --  11.5*  --  10.3*  --  10.6*  --   --   --   HGB 8.8*  < > 8.1*  < > 8.6*  < > 8.8* 9.2* 8.6* 8.6* 8.1* 8.0*  HCT 25.9*  < > 22.7*  < > 25.2*  < > 26.5* 27.0* 25.0* 26.3* 24.2* 24.3*  MCV 77.8*  --  74.9*  < > 76.4*  --  77.9*  --  79.4 79.5 78.8 78.9  PLT 158  --  144*  < > 219  --  254  --  275 292 321 336  < > = values in this interval  not displayed.  INR:  Recent Labs Lab 06/17/17 0414 06/18/17 0315 06/19/17 0326 06/20/17 0351 06/21/17 0215  INR 2.59 1.99 2.35 2.41 2.13    Other results:  EKG:    Imaging   Dg Chest Port 1 View  Result Date: 06/20/2017 CLINICAL DATA:  Weakness EXAM: PORTABLE CHEST 1 VIEW COMPARISON:  06/19/2017 FINDINGS: Defibrillator, right jugular dialysis catheter, right subclavian central line and left ventricular assist device are again noted and stable. Cardiac shadow remains enlarged. Postsurgical changes are again seen. The lungs are well aerated bilaterally with patchy opacities particularly in the left mid and lower lung stable from the prior exam. No pneumothorax is noted. IMPRESSION: Tubes and lines as described. Patchy opacities within the left lung stable from the prior study. Electronically Signed   By: Inez Catalina M.D.   On: 06/20/2017 08:21     Medications:     Scheduled Medications: . amiodarone  400 mg Oral BID  . aspirin EC  325 mg Oral Daily   Or  . aspirin  324 mg Per Tube Daily   Or  . aspirin  300 mg Rectal Daily  . atorvastatin  80 mg Oral q1800  . bisacodyl  10 mg Oral Daily   Or  . bisacodyl  10 mg Rectal Daily  . chlorhexidine  15 mL Mouth Rinse BID  . Chlorhexidine Gluconate Cloth  6 each Topical Daily  . darbepoetin (ARANESP) injection - DIALYSIS  100 mcg Intravenous Q Sat-HD  . docusate sodium  200 mg Oral Daily  . feeding supplement (ENSURE ENLIVE)  237 mL Oral TID WC  . insulin aspart  0-24 Units Subcutaneous Q4H  . mouth  rinse  15 mL Mouth Rinse q12n4p  . metoCLOPramide (REGLAN) injection  10 mg Intravenous Q6H  . mupirocin ointment   Nasal BID  . pantoprazole  40 mg Oral Daily  . sildenafil  20 mg Oral TID  . sodium chloride flush  10-40 mL Intracatheter Q12H  . sodium chloride flush  3 mL Intravenous Q12H  . warfarin  5 mg Oral ONCE-1800  . Warfarin - Pharmacist Dosing Inpatient   Does not apply q1800    Infusions: . lactated ringers  Stopped (06/14/17 1400)  . lactated ringers Stopped (06/15/17 1901)  . milrinone 0.375 mcg/kg/min (06/21/17 0032)  . nitroGLYCERIN Stopped (06/13/17 1700)  . piperacillin-tazobactam (ZOSYN)  IV 3.375 g (06/21/17 0614)    PRN Medications: heparin, hydrALAZINE, LORazepam, ondansetron (ZOFRAN) IV, oxyCODONE, promethazine, sodium chloride flush, sodium chloride flush, traMADol   Patient Profile   51 yo with history of CAD s/p anterior MI in 2010 and chronic systolic CHF (ischemic cardiomyopathy) as well as CKD stage III now s/p HeartMate 3 VAD placement.   Assessment/Plan:    1. Acute on chronic systolic CHF: Ischemic cardiomyopathy. [employer] ICD. NSTEMI in March 2018 with DES to LAD and RCA, complicated by cardiogenic shock, low output requiring milrinone. Most recent echo 8/18 with EF 20-25%, moderate MR. He was admitted and started on milrinone with improved creatinine.  HeartMate 3 VAD placement on 10/8. Speed cut back to 5400 with onset of afib/RVR 10/11.   Continues on milrinone 0.375 mcg/kg/min. Co-ox 61%.  - Decrease milrinone to 0.25 today.  - INR 2.13 INR Goal 2-3.  (aim slightly higher with h/o Factor V Leiden heterozygosity) and ASA 325. Will not lower aspirin dose with Factor V Leiden.  - Continue sildenafil for RV support.  - MAP stable.  - Will need eventual ramp echo. 2. AKI on CKD: Still no renal recovery.    - Tolerated iHD.  - Creatinine 4.04. No urine output.  Per Dr. Marval Regal, will get dose Lasix today.  3. CAD: NSTEMI in 3/18. LHC with 99% ulcerated lesion proximal RCA with left to right collaterals, 95% mid LAD stenosis after mid LAD stent. s/p PCI to RCA and LAD on 11/25/16.  - Off plavix for LVAD.   - Continue statin.   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 Recent V/Q scan did not show acute or chronic PE.  He is a heterozygote for Factor V Leiden, plan for for ASA 325 and warfarin INR 2-3 with LVAD.  INR 2.13.  5. Chronic N/V/D with gastroparesis:  Tolerating diet.    6. DM: On Jardiance at home, will cover with sliding scale. Glucose stable.   7. S/P Multiple Tooth Extractions 4-15, 19-23, and 27-29.  8. Atrial fibrillation: Paroxysmal. In NSR.  Continue amio 400 mg twice a day.      9. ID: CXR with LLL atelectasis versus infiltrate. Afebrile.  - WBC up to 17.6. He has now completed course of vancomycin/Zosyn.   10. Hyponatremia: Sodium down to 127.    I reviewed the LVAD parameters from today, and compared the results to the patient's prior recorded data.  No programming changes were made.  The LVAD is functioning within specified parameters.  The patient performs LVAD self-test daily.  LVAD interrogation was negative for any significant power changes, alarms or PI events/speed drops.  LVAD equipment check completed and is in good working order.  Back-up equipment present.   LVAD education done on emergency procedures and precautions and reviewed exit site  care.  [name] Grinder NP-C  7:41 AM   Patient seen with NP, agree with the above note.   He is doing well hemodynamically, stable MAP and LVAD parameters.  Good co-ox, CVP 10 after HD yesterday.  Still no signs of renal recovery.   - Discussed with Dr. Marval Regal, will get Lasix bolus today.  - Can decrease milrinone to 0.25 with renal dysfunction.   [name] 06/21/2017 8:18 AM

## 2017-06-21 NOTE — Progress Notes (Signed)
Patient ID: [name], male   DOB: [birth year], 51 y.o.   MRN: [id number] S:Feels well and tolerated HD yesterday but did have a drop in BP but was asymptomatic O:BP 100/86   Pulse 99   Temp (!) 97.4 F (36.3 C) (Oral)   Resp (!) 9   Ht 5' 8"  (1.727 m)   Wt 90.4 kg (199 lb 3.2 oz)   SpO2 98%   BMI 30.29 kg/m   Intake/Output Summary (Last 24 hours) at 06/21/17 0900 Last data filed at 06/21/17 0600  Gross per 24 hour  Intake           1057.2 ml  Output             2000 ml  Net           -942.8 ml   Intake/Output: I/O last 3 completed shifts: In: 2096.3 [P.O.:1390; I.V.:606.3; IV Piggyback:100] Out: 2025 [Urine:25; Other:2000]  Intake/Output this shift:  No intake/output data recorded. Weight change: 0.106 kg (3.8 oz) Gen: NAD CVS: no rub Resp:cta EHM:CNOBSJ Ext: no edema   Recent Labs Lab 06/15/17 0312  06/16/17 0200  06/17/17 0414  06/17/17 1537 06/18/17 0315 06/18/17 1624 06/18/17 1626 06/19/17 0326 06/19/17 1207 06/20/17 0351 06/21/17 0215  NA 130*  < > 132*  < > 137  136  < > 133* 137 136 137 135 131* 129* 127*  K 4.4  < > 4.2  < > 4.2  5.0  < > 5.7* 3.6 4.4 4.4 4.3 4.1 3.7 3.9  CL 95*  < > 96*  < > 99*  99*  < > 99* 98* 100* 100* 99* 97* 93* 91*  CO2 23  < > 22  < > 26  25  --  26 25 27   --  23 25 23 23   GLUCOSE 101*  < > 96  < > 152*  148*  < > 175* 137* 116* 120* 125* 365* 144* 148*  BUN 32*  < > 29*  < > 21*  20  < > 20 15 12 14 10 15 20 17   CREATININE 4.04*  < > 3.71*  < > 2.57*  2.56*  < > 2.53* 2.44* 2.44* 2.40* 2.52* 2.93* 4.52* 4.04*  ALBUMIN 2.9*  < > 2.7*  < > 2.7*  2.6*  --  2.7* 2.7* 2.5*  --  2.5* 2.4* 2.2* 2.2*  CALCIUM 8.3*  < > 8.1*  < > 8.1*  8.0*  --  7.9* 8.2* 7.9*  --  8.0* 7.7* 7.8* 7.7*  PHOS 6.6*  < > 4.6  < > 2.2*  --  1.8* 1.5* 2.0*  --  1.9* 2.0* 2.6 1.8*  AST 52*  --  36  --  33  --   --  33  --   --  33  --  25 29  ALT 22  --  19  --  21  --   --  19  --   --  19  --  15* 17  < > = values in this interval not  displayed. Liver Function Tests:  Recent Labs Lab 06/19/17 0326 06/19/17 1207 06/20/17 0351 06/21/17 0215  AST 33  --  25 29  ALT 19  --  15* 17  ALKPHOS 110  --  113 124  BILITOT 1.5*  --  1.0 1.0  PROT 6.5  --  6.2* 6.2*  ALBUMIN 2.5* 2.4* 2.2* 2.2*   No results for input(s): LIPASE,  AMYLASE in the last 168 hours. No results for input(s): AMMONIA in the last 168 hours. CBC:  Recent Labs Lab 06/17/17 0414  06/18/17 0315  06/19/17 0326 06/19/17 1207 06/20/17 0351 06/21/17 0215  WBC 14.7*  --  14.9*  --  14.1* 14.4* 16.0* 17.6*  NEUTROABS 11.5*  --  10.3*  --  10.6*  --   --   --   HGB 8.6*  < > 8.8*  < > 8.6* 8.6* 8.1* 8.0*  HCT 25.2*  < > 26.5*  < > 25.0* 26.3* 24.2* 24.3*  MCV 76.4*  --  77.9*  --  79.4 79.5 78.8 78.9  PLT 219  --  254  --  275 292 321 336  < > = values in this interval not displayed. Cardiac Enzymes:  Recent Labs Lab 06/15/17 1218  CKTOTAL 309   CBG:  Recent Labs Lab 06/20/17 2024 06/20/17 2330 06/21/17 0218 06/21/17 0402 06/21/17 0807  GLUCAP 309* 116* 130* 147* 147*    Iron Studies: No results for input(s): IRON, TIBC, TRANSFERRIN, FERRITIN in the last 72 hours. Studies/Results: Dg Chest Port 1 View  Result Date: 06/20/2017 CLINICAL DATA:  Weakness EXAM: PORTABLE CHEST 1 VIEW COMPARISON:  06/19/2017 FINDINGS: Defibrillator, right jugular dialysis catheter, right subclavian central line and left ventricular assist device are again noted and stable. Cardiac shadow remains enlarged. Postsurgical changes are again seen. The lungs are well aerated bilaterally with patchy opacities particularly in the left mid and lower lung stable from the prior exam. No pneumothorax is noted. IMPRESSION: Tubes and lines as described. Patchy opacities within the left lung stable from the prior study. Electronically Signed   By: Inez Catalina M.D.   On: 06/20/2017 08:21   . amiodarone  400 mg Oral BID  . aspirin EC  325 mg Oral Daily   Or  . aspirin  324  mg Per Tube Daily   Or  . aspirin  300 mg Rectal Daily  . atorvastatin  80 mg Oral q1800  . bisacodyl  10 mg Oral Daily   Or  . bisacodyl  10 mg Rectal Daily  . chlorhexidine  15 mL Mouth Rinse BID  . Chlorhexidine Gluconate Cloth  6 each Topical Daily  . darbepoetin (ARANESP) injection - DIALYSIS  100 mcg Intravenous Q Sat-HD  . docusate sodium  200 mg Oral Daily  . feeding supplement (ENSURE ENLIVE)  237 mL Oral TID WC  . insulin aspart  0-24 Units Subcutaneous Q4H  . mouth rinse  15 mL Mouth Rinse q12n4p  . metoCLOPramide (REGLAN) injection  10 mg Intravenous Q6H  . mupirocin ointment   Nasal BID  . pantoprazole  40 mg Oral Daily  . sildenafil  20 mg Oral TID  . sodium chloride flush  10-40 mL Intracatheter Q12H  . sodium chloride flush  3 mL Intravenous Q12H  . warfarin  5 mg Oral ONCE-1800  . Warfarin - Pharmacist Dosing Inpatient   Does not apply q1800    BMET    Component Value Date/Time   NA 127 (L) 06/21/2017 0215   NA 144 05/11/2017 0938   K 3.9 06/21/2017 0215   CL 91 (L) 06/21/2017 0215   CO2 23 06/21/2017 0215   GLUCOSE 148 (H) 06/21/2017 0215   BUN 17 06/21/2017 0215   BUN 68 (H) 05/11/2017 0938   CREATININE 4.04 (H) 06/21/2017 0215   CREATININE 1.63 (H) 11/09/2016 1505   CALCIUM 7.7 (L) 06/21/2017 0215   GFRNONAA 16 (L)  06/21/2017 0215   GFRNONAA 48 (L) 11/09/2016 1505   GFRAA 18 (L) 06/21/2017 0215   GFRAA 55 (L) 11/09/2016 1505   CBC    Component Value Date/Time   WBC 17.6 (H) 06/21/2017 0215   RBC 3.08 (L) 06/21/2017 0215   HGB 8.0 (L) 06/21/2017 0215   HGB 11.7 (L) 09/07/2016 1457   HCT 24.3 (L) 06/21/2017 0215   HCT 37.5 09/07/2016 1457   PLT 336 06/21/2017 0215   PLT 279 09/07/2016 1457   MCV 78.9 06/21/2017 0215   MCV 81 09/07/2016 1457   MCH 26.0 06/21/2017 0215   MCHC 32.9 06/21/2017 0215   RDW 17.0 (H) 06/21/2017 0215   RDW 14.9 09/07/2016 1457   LYMPHSABS 1.6 06/19/2017 0326   LYMPHSABS 1.3 09/07/2016 1457   MONOABS 1.8 (H)  06/19/2017 0326   EOSABS 0.1 06/19/2017 0326   EOSABS 0.2 09/07/2016 1457   BASOSABS 0.0 06/19/2017 0326   BASOSABS 0.0 09/07/2016 1457    Assessment/Plan:  1. AKI/CKD oliguric/anuric in setting of cardiogenic shock and +/- vanc toxicity. Has tolerated CVVHD since 06/15/17 with marked improvement in volume (net negative 7 liters over a 3 day period) and stopped 06/19/17. Unfortunately he has remained anuric  1. S/p first IHD 06/20/17 with some drop in BP and only negative 750 2. Will give dose of lasix 126m IV x 1 to see if he will start producing urine now that BP's have improved 3. Plan for HD again tomorrow and follow I'/O's. 2. Vascular access- will need tunneled HD catheter and AVF/AVG since he remains dialysis-dependent, will consult VVS 3. 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  4. CAD s/p NSTEMI 3/18. Off plavix due to LVAD 5. H/o DVT's on coumadin  6. DM- per primary svc 7. ABLA/Anemia of CKD- follow H/H, on Aranesp. Transfuse prn 8. A fib- per Cardiology, on amiodarone.   JDonetta Potts MD [employer]([phone]

## 2017-06-21 NOTE — Progress Notes (Addendum)
Physical Therapy Treatment Patient Details Name: [name] MRN: [id number] DOB: Jun 10, 1966 Today's Date: 06/21/2017    History of Present Illness 51 yo with history of CAD s/p anterior MI in 2010 and chronic systolic CHF (ischemic cardiomyopathy) as well as CKD stage III now s/p HeartMate 3 VAD placement.  PMH includes:  ASCVD,  CHF, Chron's disease, DVT, gastroparesis; HTN, ischemic cardiomyopathy DM; s/p cardiac debriblator placement.     PT Comments    Pt admitted with above diagnosis. Pt currently with functional limitations due to the deficits listed below (see PT Problem List). Pt was able to ambulate with min guard assist with Harmon Pier walker 390 feet.  Pt is progressing well.  Pt is doing well with LVAD equipment as well.   Pt will benefit from skilled PT to increase their independence and safety with mobility to allow discharge to the venue listed below.     Follow Up Recommendations  Home health PT;Supervision/Assistance - 24 hour     Equipment Recommendations  Other (comment) (rollator)    Recommendations for Other Services       Precautions / Restrictions Precautions Precautions: Fall;Sternal Precaution Comments: LVAD Restrictions Weight Bearing Restrictions: Yes    Mobility  Bed Mobility               General bed mobility comments: Pt in chair on arrival  Transfers Overall transfer level: Needs assistance Equipment used:  (EVa walker) Transfers: Sit to/from Stand Sit to Stand: Min assist;+2 safety/equipment         General transfer comment: Pt requires min guard assist to move into standing and to steady once up.  Cues to not use hands.    Second person for line management and chair follow. Upon standing, noted that pt had BM on pad.  Cleaned pt of BM with pt standing about 4 minutes for cleaning.    Ambulation/Gait Ambulation/Gait assistance: +2 safety/equipment;Min guard Ambulation Distance (Feet): 390 Feet Assistive device:  (Eva  walker) Gait Pattern/deviations: Step-through pattern;Wide base of support;Decreased stride length;Drifts right/left (decreased step height) Gait velocity: slow Gait velocity interpretation: Below normal speed for age/gender General Gait Details: Pt ambulated well with Harmon Pier walker with only cues for pursed lip breathing.  Ambulated on 2LO2 with DOE 2/4.  VSS with HR 108 bpm with walking.  Pt without LOB and very controlled with ambulation.  Stood an extra time to be washed on return to room by nurse - about 5 minutes extra standing. PI 3.3, flow 4.1, power 3.8 and speed 5450   Stairs            Wheelchair Mobility    Modified Rankin (Stroke Patients Only)       Balance Overall balance assessment: Needs assistance Sitting-balance support: No upper extremity supported;Feet supported Sitting balance-[name]: Good Sitting balance - Comments: able to don/doff socks    Standing balance support: Bilateral upper extremity supported Standing balance-[name]: Poor Standing balance comment: Requires Bil UE support to stand but light support only              High level balance activites: Turns;Direction changes High Level Balance Comments: min guard assist with above            Cognition Arousal/Alertness: Awake/alert Behavior During Therapy: WFL for tasks assessed/performed Overall Cognitive Status: Within Functional Limits for tasks assessed Area of Impairment: Problem solving;Safety/judgement;Following commands  Following Commands: Follows one step commands with increased time Safety/Judgement: Decreased awareness of safety   Problem Solving: Slow processing;Decreased initiation;Difficulty sequencing;Requires verbal cues;Requires tactile cues        Exercises General Exercises - Lower Extremity Long Arc Quad: AROM;Both;Seated;10 reps    General Comments General comments (skin integrity, edema, etc.):   Pt was able to remember  all components of switching self to batteries and back. Did need cues to place strap for controller and vest for batteries due to lines.       Pertinent Vitals/Pain Pain Assessment: Faces Faces Pain Scale: Hurts a little bit Pain Location: chest Pain Descriptors / Indicators: Grimacing Pain Intervention(s): Limited activity within patient's tolerance;Monitored during session;Repositioned    Home Living                      Prior Function            PT Goals (current goals can now be found in the care plan section) Acute Rehab PT Goals Patient Stated Goal: to go home Progress towards PT goals: Progressing toward goals    Frequency    Min 3X/week      PT Plan Current plan remains appropriate    Co-evaluation              AM-PAC PT "6 Clicks" Daily Activity  Outcome Measure  Difficulty turning over in bed (including adjusting bedclothes, sheets and blankets)?: A Lot Difficulty moving from lying on back to sitting on the side of the bed? : A Lot Difficulty sitting down on and standing up from a chair with arms (e.g., wheelchair, bedside commode, etc,.)?: A Little Help needed moving to and from a bed to chair (including a wheelchair)?: A Little Help needed walking in hospital room?: A Little Help needed climbing 3-5 steps with a railing? : Total 6 Click Score: 14    End of Session Equipment Utilized During Treatment: Oxygen;Gait belt Activity Tolerance: Patient tolerated treatment well Patient left: in chair;with call bell/phone within reach;with nursing/sitter in room Nurse Communication: Mobility status PT Visit Diagnosis: Unsteadiness on feet (R26.81);Muscle weakness (generalized) (M62.81)     Time: 1700-1749 PT Time Calculation (min) (ACUTE ONLY): 38 min  Charges:  $Gait Training: 23-37 mins $Self Care/Home Management: 8-22                    G Codes:       Zevin Nevares,PT Acute Rehabilitation [phone] (pager)    Denice Paradise 06/21/2017, 11:16 AM

## 2017-06-21 NOTE — Progress Notes (Signed)
HeartMate 3 Rounding Note   postop day #9  Subjective:    HeartMate 3 implantation October 8 with preoperative ischemic cardiomyopathy, advanced heart failure on IV inotropes, indication was destination therapy  Patient stable, extubated  postop day 1.No bleeding.Started on low dose heparin bridge to coumadin    Acute on chronic renal failure with wt gain, increased volume, increased pulsatility- VAD speed up to 5600- now back to 5400 rpm with less PI events  Volume overload improved with CRT.   Walking in hall  Oliguric Acute renal failure with rising cvp- trialysis cath placed 10-11 and CRT started goal 100 -200cc/hr removal for increased wt, cvp With wt and CVP normal will transition to intermittent HD and follow renal recovery  Hx factor V Leiden deficiency. Now  with po coumadin-  INR  Goal2.5, dose per pharmD  Minimal urine output after CRT stopped with creat up to 4.2 Intermittent HD     stop clonidine. Still no urine output so patient will need dialysis tomorrow  LVAD INTERROGATION:  HeartMate IIILVAD:  Flow 4.2 liters/min, speed 5400, power 4.0, PI 3.0.  Controller Serial intact.   Objective:    Vital Signs:   Temp:  [97.4 F (36.3 C)-99.1 F (37.3 C)] 97.9 F (36.6 C) (10/17 1225) Pulse Rate:  [95-114] 95 (10/17 1225) Resp:  [1-27] 15 (10/17 1225) BP: (71-119)/(56-89) 119/87 (10/17 1225) SpO2:  [93 %-100 %] 99 % (10/17 1225) Weight:  [199 lb 3.2 oz (90.4 kg)] 199 lb 3.2 oz (90.4 kg) (10/17 0700) Last BM Date: 06/20/17 Mean arterial Pressure 85 mmHg  Intake/Output:   Intake/Output Summary (Last 24 hours) at 06/21/17 1244 Last data filed at 06/21/17 1206  Gross per 24 hour  Intake          1616.87 ml  Output             2000 ml  Net          -383.13 ml     Physical Exam: General:  Well appearing. No resp difficulty HEENT: normal Neck: supple. JVP .; no bruits. No lymphadenopathy or thryomegaly appreciated. Cor: Mechanical heart sounds with LVAD hum  present. Lungs: clear Abdomen: soft, nontender, nondistended. No hepatosplenomegaly. No bruits or masses. Good bowel sounds. Extremities: no cyanosis, clubbing, rash, mild edema of the extremities edema Neuro: alert & orientedx3, cranial nerves grossly intact. moves all 4 extremities w/o difficulty. Affect pleasant  Telemetry: A paced at 88/m  Labs: Basic Metabolic Panel:  Recent Labs Lab 06/17/17 0414  06/18/17 0315 06/18/17 1624 06/18/17 1626 06/19/17 0326 06/19/17 1207 06/20/17 0351 06/21/17 0215  NA 137  136  < > 137 136 137 135 131* 129* 127*  K 4.2  5.0  < > 3.6 4.4 4.4 4.3 4.1 3.7 3.9  CL 99*  99*  < > 98* 100* 100* 99* 97* 93* 91*  CO2 26  25  < > 25 27  --  23 25 23 23   GLUCOSE 152*  148*  < > 137* 116* 120* 125* 365* 144* 148*  BUN 21*  20  < > 15 12 14 10 15 20 17   CREATININE 2.57*  2.56*  < > 2.44* 2.44* 2.40* 2.52* 2.93* 4.52* 4.04*  CALCIUM 8.1*  8.0*  < > 8.2* 7.9*  --  8.0* 7.7* 7.8* 7.7*  MG 2.6*  --  2.4  --   --  2.4  --  2.2 1.9  PHOS 2.2*  < > 1.5* 2.0*  --  1.9*  2.0* 2.6 1.8*  < > = values in this interval not displayed.  Liver Function Tests:  Recent Labs Lab 06/17/17 0414  06/18/17 0315 06/18/17 1624 06/19/17 0326 06/19/17 1207 06/20/17 0351 06/21/17 0215  AST 33  --  33  --  33  --  25 29  ALT 21  --  19  --  19  --  15* 17  ALKPHOS 108  --  132*  --  110  --  113 124  BILITOT 1.6*  --  1.3*  --  1.5*  --  1.0 1.0  PROT 6.6  --  7.0  --  6.5  --  6.2* 6.2*  ALBUMIN 2.7*  2.6*  < > 2.7* 2.5* 2.5* 2.4* 2.2* 2.2*  < > = values in this interval not displayed. No results for input(s): LIPASE, AMYLASE in the last 168 hours. No results for input(s): AMMONIA in the last 168 hours.  CBC:  Recent Labs Lab 06/15/17 0312  06/16/17 0200  06/17/17 0414  06/18/17 0315 06/18/17 1626 06/19/17 0326 06/19/17 1207 06/20/17 0351 06/21/17 0215  WBC 24.5*  --  20.1*  < > 14.7*  --  14.9*  --  14.1* 14.4* 16.0* 17.6*  NEUTROABS 21.7*  --   17.3*  --  11.5*  --  10.3*  --  10.6*  --   --   --   HGB 8.8*  < > 8.1*  < > 8.6*  < > 8.8* 9.2* 8.6* 8.6* 8.1* 8.0*  HCT 25.9*  < > 22.7*  < > 25.2*  < > 26.5* 27.0* 25.0* 26.3* 24.2* 24.3*  MCV 77.8*  --  74.9*  < > 76.4*  --  77.9*  --  79.4 79.5 78.8 78.9  PLT 158  --  144*  < > 219  --  254  --  275 292 321 336  < > = values in this interval not displayed.  INR:  Recent Labs Lab 06/17/17 0414 06/18/17 0315 06/19/17 0326 06/20/17 0351 06/21/17 0215  INR 2.59 1.99 2.35 2.41 2.13    Other results:  EKG:   Imaging: Dg Chest Port 1 View  Result Date: 06/20/2017 CLINICAL DATA:  Weakness EXAM: PORTABLE CHEST 1 VIEW COMPARISON:  06/19/2017 FINDINGS: Defibrillator, right jugular dialysis catheter, right subclavian central line and left ventricular assist device are again noted and stable. Cardiac shadow remains enlarged. Postsurgical changes are again seen. The lungs are well aerated bilaterally with patchy opacities particularly in the left mid and lower lung stable from the prior exam. No pneumothorax is noted. IMPRESSION: Tubes and lines as described. Patchy opacities within the left lung stable from the prior study. Electronically Signed   By: Inez Catalina M.D.   On: 06/20/2017 08:21     Medications:     Scheduled Medications: . amiodarone  400 mg Oral BID  . aspirin EC  325 mg Oral Daily   Or  . aspirin  324 mg Per Tube Daily   Or  . aspirin  300 mg Rectal Daily  . atorvastatin  80 mg Oral q1800  . bisacodyl  10 mg Oral Daily   Or  . bisacodyl  10 mg Rectal Daily  . chlorhexidine  15 mL Mouth Rinse BID  . Chlorhexidine Gluconate Cloth  6 each Topical Daily  . darbepoetin (ARANESP) injection - DIALYSIS  100 mcg Intravenous Q Sat-HD  . docusate sodium  200 mg Oral Daily  . feeding supplement (ENSURE ENLIVE)  237 mL  Oral TID WC  . insulin aspart  0-24 Units Subcutaneous Q4H  . mouth rinse  15 mL Mouth Rinse q12n4p  . mupirocin ointment   Nasal BID  . pantoprazole   40 mg Oral Daily  . sildenafil  20 mg Oral TID  . sodium chloride flush  10-40 mL Intracatheter Q12H  . sodium chloride flush  3 mL Intravenous Q12H  . warfarin  5 mg Oral ONCE-1800  . Warfarin - Pharmacist Dosing Inpatient   Does not apply q1800    Infusions: . lactated ringers Stopped (06/14/17 1400)  . lactated ringers Stopped (06/15/17 1901)  . milrinone 0.25 mcg/kg/min (06/21/17 1200)  . piperacillin-tazobactam (ZOSYN)  IV Stopped (06/21/17 1034)    PRN Medications: heparin, hydrALAZINE, LORazepam, ondansetron (ZOFRAN) IV, oxyCODONE, promethazine, sodium chloride flush, sodium chloride flush, traMADol   Assessment:  HeartMate 3 VAD implant for ischemic cardiomyopathy Preoperative ventricular ectopy with permanent pacemaker-AICD Preoperative moderate chronic kidney disease stage III Preoperative hemicolectomy for granulomatous colitis Poorly controlled diabetes mellitus with hemoglobin A1c 10.0, neuropathy Peripheral vascular disease with ABIs 0.5 bilaterally  postoperative acute renal failure with rising creatinine 4.2 and oliguria- HD cath placed and CRT applied 10-12 - 10-15 Postop atrial fib on amiodarone Excellent VAD performance postop  Plan/Discussion:      Continue 0.375 milrinone for RV function until renal fx recovers    HD today   NSR - on po amiodarone   evidence of acute on chronic renal insufficiency with low urine output and fluid retention and poor response to Lasix - CRT started10-11 and  stopped 10-15   All chest drains out - incisions clean, dry  Adv diet to mech soft Cont coumadin, 325 ASA Coumadin 5 mg ordered for today No response to IV Lasix and anticipate patient will need hemodialysis tomorrow  I reviewed the LVAD parameters from today, and compared the results to the patient's prior recorded data.  No programming changes were made.  The LVAD is functioning within specified parameters.  The patient performs LVAD self-test daily.  LVAD  interrogation was negative for any significant power changes, alarms or PI events/speed drops.  LVAD equipment check completed and is in good working order.  Back-up equipment present.   LVAD education done on emergency procedures and precautions and reviewed exit site care.  Length of Stay: [place] 06/21/2017, 12:44 PM

## 2017-06-21 NOTE — Progress Notes (Signed)
VAD Education: VAD coordinator met with patient and daughter [name]) and reviewed Patient Discharge Binder contents. Both patient and daughter agree to read HM III Patient Handbook. Answered questions about home equipment including demonstrating Mobile Power Unit that will be used at home in place of the Power Module. Both verbalized understanding of same.  Provided Battery Holster as an alternative to pt's current Holster Vest. He walked again earlier using the holster vest and still is uncomfortable with how it feels. He would like to try the battery holster next walk.  Demonstrated to daughter how to change patient's VAD dressing. Practiced donning sterile gloves; she will take extra gloves home in order to continue practicing.  Existing VAD dressing removed and site care performed using sterile technique. Drive line exit site cleaned with Chlora prep applicators x 2, allowed to dry, and gauze dressing with aquacel silver strips re-applied. Exit site with two sutures intact, small amount dry bloody drainage noted on strip, the velour is fully implanted at exit site. No redness, tenderness, foul odor, or rash noted. Drive line anchor intact and accurately applied. Reviewed with both patient and daughter signs of infection including redness, tenderness, new or continued drainage, foul odor, fever or chills. All of these symptoms would need to be reported asap if any should occur after discharge home.  Both verbalized understanding of same.

## 2017-06-21 NOTE — Progress Notes (Signed)
LVAD Coordinator Rounding Note:  Admitted 06/07/2017 for hemodynamic optimization prior to LVAD placement. Full dental extractions prior to LVAD implant.  HM3LVAD implated on 10/8/2018by Dr Craige Cotta Destination Therapycriteria due to renal insufficiency.  Vital signs: HR: 100 - sinus tach Doppler:  Not done Auto cuff: 100/86 (92) O2 Sat: 98% on 2LNC NO: off 06/19/17  Wt:194>217>220>220>226>213>207>199>198 >198>199lbs   LVAD interrogation reveals:   Speed: 5400 Flow:  4.1 Power: 3.9w PI: 3.3 Alarms: none Events: none Hematocrit: 25 Fixed speed: 5400 Low speed limit: 5100   Drive Line: Daily. Change daily per protocol using daily kits and silver strip.    Labs:  LDH trend:255>343>379>376>366>314>304  INR trend: 1.44>1.23>1.44>1.47>1.93>2.41>2.13  Hgb trend: 9.6>8.8>8.1>8.6>8.8>8.6>8.1>8.0  Anticoagulation Plan: -INR Goal: 2-3, warfarin restarted 10/9  -ASA Dose: 335m (will remain on this with factor V heterozygosity)  Blood Products:  - 2 units FFP in OR - 1 unit PC in OR - 1 unit PRBC's 10/12  Gtts: - Milrinone 0.375 mcg/kg/min - will wean to 0.25 today  Device: -St Jude  -Therapies: off. Perm pacer set at 60 bpm. -Temp pacer set AAI at 88 bpm, MA-5  Arrythmias: Atrial fib started 10/12. Amiodarone started 06/12/17 initially for ventricular arrhythmia   Respiratory: extubated 10/9   Renal:  Creat trend:  1.62>2.75>4.04>3.71>>2.56>2.44>2.52>4.52>4.4 - Oliguric renal failure with rising CVP - Trialysis cath placed 06/16/17 and CVVHD started goal 100 cc/hr removal for increased wt, CVP - Stop CVVHD 06/19/17 and plan for intermittent hemodialysis while pt oliguric -  06/20/17 - HD  Adverse Events on VAD: -Oliguric renal failure post implant  Patient Education: Pt voiced problems with understanding how to wear Holster Vest. Vest and belt attachment re-configured with demonstration to patient in how to wear equipment. Pt  verbalized understanding of same.   Plan/Recommendations:  1. Continue daily dressing changes with family education  2. No HD today 3. Asked patient to complete reading HM III Patient Manual prior to discharge teaching session. 4. Call VAD Coordinator if any questions re: VAD or VAD equipment.   MZada GirtRN, VAD Coordinator 24/7 pager [phone]

## 2017-06-21 NOTE — Progress Notes (Signed)
[place] for Coumadin (asked by Dr. Prescott Gum for pharmacy to dose starting 10/13) Indication: LVAD, h/o DVT Factor V Leiden heterozygosity  Allergies  Allergen Reactions  . Metformin And Related Diarrhea    Patient Measurements: Height: 5' 8"  (172.7 cm) Weight: 198 lb 10.2 oz (90.1 kg) IBW/kg (Calculated) : 68.4   Vital Signs: Temp: 98.3 F (36.8 C) (10/17 0300) Temp Source: Oral (10/17 0300) BP: 90/75 (10/17 0000) Pulse Rate: 106 (10/17 0000)  Labs:  Recent Labs  06/19/17 0326 06/19/17 1207 06/20/17 0351 06/21/17 0215  HGB 8.6* 8.6* 8.1* 8.0*  HCT 25.0* 26.3* 24.2* 24.3*  PLT 275 292 321 336  LABPROT 25.5*  --  26.0* 23.6*  INR 2.35  --  2.41 2.13  CREATININE 2.52* 2.93* 4.52* 4.04*    Estimated Creatinine Clearance: 23.3 mL/min (A) (by C-G formula based on SCr of 4.04 mg/dL (H)).   Medical History: Past Medical History:  Diagnosis Date  . Angina   . ASCVD (arteriosclerotic cardiovascular disease)    , Anterior infarction 2005, LAD diagonal bifurcation intervention 03/2004  . Automatic implantable cardiac defibrillator -St. Jude's       . Benign neoplasm of colon   . CHF (congestive heart failure) ([place])   . Chronic systolic heart failure ([place])   . Coronary artery disease     Widely patent previously placed stents in the left anterior   . Crohn's disease ([place])   . 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 Dyspnea   . Gastroparesis   . GERD (gastroesophageal reflux disease)   . High cholesterol   . Hyperlipidemia   . Hypersomnolent    Previous diagnosis of narcolepsy  . Hypertension, essential   . Ischemic cardiomyopathy    Ejection fraction 15-20% catheterization 2010  . Type II or unspecified type diabetes mellitus without mention of complication, not stated as uncontrolled   . Unspecified gastritis and gastroduodenitis without mention of hemorrhage     Assessment: 32 yoM on chronic  Coumadin for Factor 5 Leiden and hx DVTs admitted for LVAD placement. INR therapeutic at 2.13, CBC and LDH stable, no issues per RN. Plan per HF team and TCTS is INR goal 2-3 and full dose ASA at discharge.  Goal of Therapy:  INR 2-3 Monitor platelets by anticoagulation protocol: Yes   Plan:  -Coumadin 5 mg PO x1 tonight -Daily PT/INR.  Arrie Senate, PharmD PGY-2 Cardiology Pharmacy Resident Pager: [phone] 06/21/2017

## 2017-06-22 ENCOUNTER — Inpatient Hospital Stay (HOSPITAL_COMMUNITY): Payer: BLUE CROSS/BLUE SHIELD

## 2017-06-22 DIAGNOSIS — Z954 Presence of other heart-valve replacement: Secondary | ICD-10-CM

## 2017-06-22 DIAGNOSIS — N184 Chronic kidney disease, stage 4 (severe): Secondary | ICD-10-CM

## 2017-06-22 DIAGNOSIS — I5043 Acute on chronic combined systolic (congestive) and diastolic (congestive) heart failure: Secondary | ICD-10-CM

## 2017-06-22 LAB — HEPARIN LEVEL (UNFRACTIONATED): HEPARIN UNFRACTIONATED: 0.2 [IU]/mL — AB (ref 0.30–0.70)

## 2017-06-22 LAB — COMPREHENSIVE METABOLIC PANEL
ALT: 15 U/L — ABNORMAL LOW (ref 17–63)
AST: 24 U/L (ref 15–41)
Albumin: 2.1 g/dL — ABNORMAL LOW (ref 3.5–5.0)
Alkaline Phosphatase: 115 U/L (ref 38–126)
Anion gap: 11 (ref 5–15)
BUN: 29 mg/dL — ABNORMAL HIGH (ref 6–20)
CO2: 24 mmol/L (ref 22–32)
Calcium: 8 mg/dL — ABNORMAL LOW (ref 8.9–10.3)
Chloride: 92 mmol/L — ABNORMAL LOW (ref 101–111)
Creatinine, Ser: 6.17 mg/dL — ABNORMAL HIGH (ref 0.61–1.24)
GFR calc Af Amer: 11 mL/min — ABNORMAL LOW (ref >60)
GFR calc non Af Amer: 9 mL/min — ABNORMAL LOW (ref >60)
Glucose, Bld: 174 mg/dL — ABNORMAL HIGH (ref 65–99)
Potassium: 4 mmol/L (ref 3.5–5.1)
Sodium: 127 mmol/L — ABNORMAL LOW (ref 135–145)
Total Bilirubin: 0.7 mg/dL (ref 0.3–1.2)
Total Protein: 6.3 g/dL — ABNORMAL LOW (ref 6.5–8.1)

## 2017-06-22 LAB — CBC
HCT: 22.8 % — ABNORMAL LOW (ref 39.0–52.0)
HEMATOCRIT: 28.1 % — AB (ref 39.0–52.0)
HEMOGLOBIN: 9.3 g/dL — AB (ref 13.0–17.0)
Hemoglobin: 7.7 g/dL — ABNORMAL LOW (ref 13.0–17.0)
MCH: 26.6 pg (ref 26.0–34.0)
MCH: 26.3 pg (ref 26.0–34.0)
MCHC: 33.8 g/dL (ref 30.0–36.0)
MCHC: 33.1 g/dL (ref 30.0–36.0)
MCV: 78.9 fL (ref 78.0–100.0)
MCV: 79.6 fL (ref 78.0–100.0)
Platelets: 354 10*3/uL (ref 150–400)
Platelets: 391 10*3/uL (ref 150–400)
RBC: 2.89 MIL/uL — ABNORMAL LOW (ref 4.22–5.81)
RBC: 3.53 MIL/uL — AB (ref 4.22–5.81)
RDW: 17.1 % — ABNORMAL HIGH (ref 11.5–15.5)
RDW: 17.6 % — AB (ref 11.5–15.5)
WBC: 16 10*3/uL — ABNORMAL HIGH (ref 4.0–10.5)
WBC: 16.6 10*3/uL — AB (ref 4.0–10.5)

## 2017-06-22 LAB — RENAL FUNCTION PANEL
ALBUMIN: 2.2 g/dL — AB (ref 3.5–5.0)
ANION GAP: 9 (ref 5–15)
BUN: 9 mg/dL (ref 6–20)
CHLORIDE: 97 mmol/L — AB (ref 101–111)
CO2: 28 mmol/L (ref 22–32)
Calcium: 7.9 mg/dL — ABNORMAL LOW (ref 8.9–10.3)
Creatinine, Ser: 3.16 mg/dL — ABNORMAL HIGH (ref 0.61–1.24)
GFR, EST AFRICAN AMERICAN: 24 mL/min — AB (ref >60)
GFR, EST NON AFRICAN AMERICAN: 21 mL/min — AB (ref >60)
Glucose, Bld: 102 mg/dL — ABNORMAL HIGH (ref 65–99)
PHOSPHORUS: 1.6 mg/dL — AB (ref 2.5–4.6)
POTASSIUM: 4 mmol/L (ref 3.5–5.1)
Sodium: 134 mmol/L — ABNORMAL LOW (ref 135–145)

## 2017-06-22 LAB — MAGNESIUM: Magnesium: 2 mg/dL (ref 1.7–2.4)

## 2017-06-22 LAB — COOXEMETRY PANEL
Carboxyhemoglobin: 1.5 % (ref 0.5–1.5)
Methemoglobin: 1.1 % (ref 0.0–1.5)
O2 Saturation: 63.7 %
Total hemoglobin: 11.3 g/dL — ABNORMAL LOW (ref 12.0–16.0)

## 2017-06-22 LAB — GLUCOSE, CAPILLARY
GLUCOSE-CAPILLARY: 181 mg/dL — AB (ref 65–99)
GLUCOSE-CAPILLARY: 98 mg/dL (ref 65–99)
Glucose-Capillary: 189 mg/dL — ABNORMAL HIGH (ref 65–99)
Glucose-Capillary: 159 mg/dL — ABNORMAL HIGH (ref 65–99)
Glucose-Capillary: 158 mg/dL — ABNORMAL HIGH (ref 65–99)
Glucose-Capillary: 199 mg/dL — ABNORMAL HIGH (ref 65–99)

## 2017-06-22 LAB — PHOSPHORUS: Phosphorus: 3.4 mg/dL (ref 2.5–4.6)

## 2017-06-22 LAB — PROTIME-INR
INR: 1.72
Prothrombin Time: 20 seconds — ABNORMAL HIGH (ref 11.4–15.2)

## 2017-06-22 LAB — PREPARE RBC (CROSSMATCH)

## 2017-06-22 LAB — LACTATE DEHYDROGENASE: LDH: 286 U/L — ABNORMAL HIGH (ref 98–192)

## 2017-06-22 MED ORDER — HEPARIN (PORCINE) IN NACL 100-0.45 UNIT/ML-% IJ SOLN
1050.0000 [IU]/h | INTRAMUSCULAR | Status: DC
  Administered 2017-06-22: 800 [IU]/h via INTRAVENOUS
  Administered 2017-06-23: 1050 [IU]/h via INTRAVENOUS
  Filled 2017-06-22 (×5): qty 250

## 2017-06-22 MED ORDER — PRO-STAT SUGAR FREE PO LIQD
30.0000 mL | Freq: Three times a day (TID) | ORAL | Status: DC
  Administered 2017-06-23 – 2017-07-02 (×18): 30 mL via ORAL
  Administered 2017-07-02: 13:00:00 via ORAL
  Administered 2017-07-03: 30 mL via ORAL
  Filled 2017-06-22 (×18): qty 30

## 2017-06-22 MED ORDER — WARFARIN SODIUM 7.5 MG PO TABS: 7.5000 mg | ORAL_TABLET | Freq: Once | ORAL | Status: DC

## 2017-06-22 MED ORDER — WARFARIN SODIUM 5 MG PO TABS
5.0000 mg | ORAL_TABLET | Freq: Once | ORAL | Status: AC
  Administered 2017-06-22: 5 mg via ORAL
  Filled 2017-06-22: qty 1

## 2017-06-22 MED ORDER — HEPARIN SODIUM (PORCINE) 1000 UNIT/ML DIALYSIS: 20.0000 [IU]/kg | INTRAMUSCULAR | Status: DC | PRN

## 2017-06-22 NOTE — Progress Notes (Addendum)
Nutrition Follow-up  DOCUMENTATION CODES:   Not applicable  INTERVENTION:    Continue Glucerna Shake po TID, each supplement provides 220 kcal and 10 grams of protein   Add Prostat liquid protein po 30 ml TID with meals, each supplement provides 100 kcal, 15 grams protein  NUTRITION DIAGNOSIS:   Increased nutrient needs related to chronic illness (post-op healing) as evidenced by estimated needs  Ongoing   GOAL:   Patient will meet greater than or equal to 90% of their needs  Progressing  MONITOR:   PO intake, Supplement acceptance, Labs, Weight trends, Skin, I & O's  ASSESSMENT:   Pt with PMH of CAD s/p MI, CHF, CKD, chronic N/V/D from gastroparesis, DM, admitted 10/3 for optimization prior to LVAD. Multiple teeth extractions 10/4.   S/p VAD placement 10/8 Started CVVHD 10/11, stopped 10/15  Pt getting blood drawn upon RD visit this AM. Reports his appetite is "ok". PO intake 50% per flowsheets.  States he is drinking two (2) Glucerna Shake supplements per day.  Oliguric acute renal failure. Nephrology following. Transitioned to IHD. Medications reviewed and include Coumadin, ABX and Colace. CBG's [phone].  Diet Order:  DIET DYS 3 Room service appropriate? Yes; Fluid consistency: Thin; Fluid restriction: 1800 mL Fluid  Skin:  Reviewed, no issues  Last BM:  10/17   Intake/Output Summary (Last 24 hours) at 06/22/17 1655 Last data filed at 06/22/17 1600  Gross per 24 hour  Intake            917.1 ml  Output             1500 ml  Net           -582.9 ml   Height:   Ht Readings from Last 1 Encounters:  06/19/17 5' 8"  (1.727 m)   Weight:   Wt Readings from Last 1 Encounters:  06/22/17 209 lb 14.1 oz (95.2 kg)  Admit wt         189 lb (85.7 kg)  Ideal Body Weight:  70 kg  BMI:  Body mass index is 31.91 kg/m.  Estimated Nutritional Needs:   Kcal:  0350-0938  Protein:  125-140 gm  Fluid:  per MD  EDUCATION NEEDS:   No education needs  identified at this time  Arthur Holms, RD, LDN Pager #: [phone] After-Hours Pager #: [phone]

## 2017-06-22 NOTE — Progress Notes (Signed)
Arrived to patient room 2H-10 at 1144.  Reviewed treatment plan and this RN agrees with plan.  Report received from bedside RN, Hyiu.  Consent verified.  Patient A & O X 4.   Lung sounds diminished to ausculation in all fields. Generalized non pitting edema. Cardiac:  NSR to ST c PVC.  Removed caps and cleansed RIJ catheter with chlorhedxidine.  Aspirated ports of heparin and flushed them with saline per protocol.  Connected and secured lines, initiated treatment at 1155.  UF Goal of 2000 mL and net fluid removal 1.5 L.  Will continue to monitor.

## 2017-06-22 NOTE — Progress Notes (Addendum)
[place] for Coumadin/Heparin Indication: LVAD, h/o DVT Factor V Leiden heterozygosity  Allergies  Allergen Reactions  . Metformin And Related Diarrhea    Patient Measurements: Height: 5' 8"  (172.7 cm) Weight: 201 lb (91.2 kg) IBW/kg (Calculated) : 68.4   Vital Signs: Temp: 97.6 F (36.4 C) (10/18 0300) Temp Source: Oral (10/18 0300) BP: 83/58 (10/18 0700) Pulse Rate: 89 (10/18 0700)  Labs:  Recent Labs  06/20/17 0351 06/21/17 0215 06/22/17 0219  HGB 8.1* 8.0* 7.7*  HCT 24.2* 24.3* 22.8*  PLT 321 336 354  LABPROT 26.0* 23.6* 20.0*  INR 2.41 2.13 1.72  CREATININE 4.52* 4.04* 6.17*    Estimated Creatinine Clearance: 15.4 mL/min (A) (by C-G formula based on SCr of 6.17 mg/dL (H)).   Medical History: Past Medical History:  Diagnosis Date  . Angina   . ASCVD (arteriosclerotic cardiovascular disease)    , Anterior infarction 2005, LAD diagonal bifurcation intervention 03/2004  . Automatic implantable cardiac defibrillator -St. Jude's       . Benign neoplasm of colon   . CHF (congestive heart failure) ([place])   . Chronic systolic heart failure ([place])   . Coronary artery disease     Widely patent previously placed stents in the left anterior   . Crohn's disease ([place])   . 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 Dyspnea   . Gastroparesis   . GERD (gastroesophageal reflux disease)   . High cholesterol   . Hyperlipidemia   . Hypersomnolent    Previous diagnosis of narcolepsy  . Hypertension, essential   . Ischemic cardiomyopathy    Ejection fraction 15-20% catheterization 2010  . Type II or unspecified type diabetes mellitus without mention of complication, not stated as uncontrolled   . Unspecified gastritis and gastroduodenitis without mention of hemorrhage     Assessment: 89 yoM on chronic Coumadin for Factor 5 Leiden and hx DVTs admitted for LVAD placement. INR subtherapeutic this morning at 1.7, LDH  stable. Will cover with heparin while INR < 2.  Goal of Therapy:  INR 2-3  Heparin Level 0.3-0.7 Monitor platelets by anticoagulation protocol: Yes   Plan:  -Heparin 800 units/hr -Check 8-hr heparin level -Coumadin 5 mg PO x1 tonight -Daily PT/INR, CBC, heparin level  Arrie Senate, PharmD PGY-2 Cardiology Pharmacy Resident Pager: [phone] 06/22/2017

## 2017-06-22 NOTE — Significant Event (Signed)
Demonstrated dressing change with patient and his son [name] "[name]", changing LVAD driveline dressing using sterile techniques. All questions answered during procedure. Driveline site was not discolored, no drainage or odor noted.   Patient and his son did not have further questions at end of procedure.

## 2017-06-22 NOTE — Progress Notes (Signed)
[place] for Coumadin/Heparin Indication: LVAD, h/o DVT Factor V Leiden heterozygosity  Allergies  Allergen Reactions  . Metformin And Related Diarrhea    Patient Measurements: Height: 5' 8"  (172.7 cm) Weight: 209 lb 14.1 oz (95.2 kg) IBW/kg (Calculated) : 68.4   Vital Signs: Temp: 98.1 F (36.7 C) (10/18 1555) Temp Source: Oral (10/18 1209) BP: 95/83 (10/18 1600) Pulse Rate: 101 (10/18 1600)  Labs:  Recent Labs  06/20/17 0351 06/21/17 0215 06/22/17 0219 06/22/17 1650 06/22/17 1651  HGB 8.1* 8.0* 7.7* 9.3*  --   HCT 24.2* 24.3* 22.8* 28.1*  --   PLT 321 336 354 391  --   LABPROT 26.0* 23.6* 20.0*  --   --   INR 2.41 2.13 1.72  --   --   HEPARINUNFRC  --   --   --   --  0.20*  CREATININE 4.52* 4.04* 6.17*  --   --     Estimated Creatinine Clearance: 15.7 mL/min (A) (by C-G formula based on SCr of 6.17 mg/dL (H)).   Medical History: Past Medical History:  Diagnosis Date  . Angina   . ASCVD (arteriosclerotic cardiovascular disease)    , Anterior infarction 2005, LAD diagonal bifurcation intervention 03/2004  . Automatic implantable cardiac defibrillator -St. Jude's       . Benign neoplasm of colon   . CHF (congestive heart failure) ([place])   . Chronic systolic heart failure ([place])   . Coronary artery disease     Widely patent previously placed stents in the left anterior   . Crohn's disease ([place])   . 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 Dyspnea   . Gastroparesis   . GERD (gastroesophageal reflux disease)   . High cholesterol   . Hyperlipidemia   . Hypersomnolent    Previous diagnosis of narcolepsy  . Hypertension, essential   . Ischemic cardiomyopathy    Ejection fraction 15-20% catheterization 2010  . Type II or unspecified type diabetes mellitus without mention of complication, not stated as uncontrolled   . Unspecified gastritis and gastroduodenitis without mention of hemorrhage      Assessment: [name] on chronic Coumadin for Factor 5 Leiden and hx DVTs admitted for LVAD placement. INR subtherapeutic this morning at 1.7, LDH stable. Will cover with heparin while INR < 2.  Heparin level low at 0.20  Goal of Therapy:  INR 2-3  Heparin Level 0.3-0.7 Monitor platelets by anticoagulation protocol: Yes   Plan:  -Heparin to 1000 units/hr -Check 8-hr heparin level -Coumadin 5 mg PO x1 tonight -Daily PT/INR, CBC, heparin level  Thank you Anette Guarneri, PharmD [phone] 06/22/2017

## 2017-06-22 NOTE — Progress Notes (Signed)
LVAD Coordinator Rounding Note:  Admitted 06/07/2017 for hemodynamic optimization prior to LVAD placement. Full dental extractions prior to LVAD implant.  HM3LVAD implated on 10/8/2018by Dr Craige Cotta Destination Therapycriteria due to renal insufficiency.  Vital signs: Temp: 97.6 HR: 100 - sinus tach Doppler:  90 Auto cuff: 109/84 (93) O2 Sat: 96% on RA  Wt:194>217>220>220>226>213>207>199>198 >198>199>201lbs   LVAD interrogation reveals:   Speed: 5400 Flow:  4.1 Power: 3.9w PI: 3.3 Alarms: none Events: none Hematocrit: 25 Fixed speed: 5400 Low speed limit: 5100   Drive Line: Daily. Change daily per protocol using daily kits and silver strip.    Labs:  LDH trend:255>343>379>376>366>314>304>286  INR trend: 1.44>1.23>1.44>1.47>1.93>2.41>2.13>1.72  Hgb trend: 9.6>8.8>8.1>8.6>8.8>8.6>8.1>8.0>7.7  Anticoagulation Plan: -INR Goal: 2-3, warfarin restarted 10/9  -ASA Dose: 316m (will remain on this with factor V heterozygosity) -Heparin level 0.3 - 0.7  Blood Products:  - 2 units FFP in OR - 1 unit PC in OR - 1 unit PRBC's 10/12  NO: off 06/19/17  Gtts: - Milrinone 0.25 mcg/mg/min - wean to 0.125 today  Device: -St Jude  -Therapies: off. Perm pacer set at 60 bpm. -Temp pacer set AAI at 88 bpm, MA-5  Arrythmias: Atrial fib started 10/12. Amiodarone started 06/12/17 initially for ventricular arrhythmia   Respiratory: extubated 10/9   Renal:  Creat trend:  1.62>2.75>4.04>3.71>>2.56>2.44>2.52>4.52>4.4>6.17 - Oliguric renal failure with rising CVP - Trialysis cath placed 06/16/17 and CVVHD started goal 100 cc/hr removal for increased wt, CVP - Stop CVVHD 06/19/17 and plan for intermittent hemodialysis while pt oliguric -  06/20/17 - HD -  06/22/17 - HD later today  Adverse Events on VAD: -Oliguric renal failure post implant with initiation of CVVHD and HD  Patient Education: Patient switching from batteries to power module  without difficulty. Pt performed self test this am on his controller without difficulty. Walked in hallway x 2 this am and still is having problems with holster vest; wearing controller around neck. Will try Holster vest next walk. Son is scheduled to come by this afternoon @ 3:00pm; pt would like [place] teach him to do dressing changes (as well as his daughter). Plan to come up and demonstrated dressing change to son this afternoon.  Pt does c/o of decreased appetite and bouts of nausea. He is drinking his Glucerna shakes x 3 daily. Informed him to try and eat smaller, more frequent meals until appetite returns, and stressed importance of protein intake to promote wound healing. Pt verbalized understanding of same.   Plan/Recommendations:  1. Continue daily dressing changes with family education - VAD coordinator will perform later this afternoon with son 2. HD planned later today 3. Asked patient and caregivers to complete reading HM III Patient Manual prior to discharge teaching session 4. Pt getting one unit PC's today  5. Call VAD Coordinator if any questions re: VAD or VAD equipment.   MZada GirtRN, VAD Coordinator 24/7 pager [phone]

## 2017-06-22 NOTE — Progress Notes (Signed)
CSW met with at bedside. Patient reports he just finished dialysis and feeling tired. Patient states his family has been visiting and getting more comfortable with the equipment and will begin learning the dressing changes. He states his care "will be a family affair". Patient states he walked around the hall of his unit 2x this morning and was very proud of his achievement. Patient reports he enjoyed attending the LVAD support group and hopes to continue attending in the future. CSW provided supportive intervention and encouragement. Patient appears to be very positive despite some medical set backs. CSW will continue to follow for supportive intervention throughout implant hospitalization. Raquel Sarna, [place], [place]

## 2017-06-22 NOTE — Progress Notes (Signed)
OT Cancellation Note  Patient Details Name: [name] MRN: [id number] DOB: 15-Feb-1966   Cancelled Treatment:    Reason Eval/Treat Not Completed: Fatigue/lethargy limiting ability to participate.   Pt has walked x 2 with nsg, is fatigued and awaiting HD.  Will reattempt.  Morris, OTR/L [phone]   [name] 06/22/2017, 10:01 AM

## 2017-06-22 NOTE — Progress Notes (Signed)
Patient ID: [name], male   DOB: 02/16/1966, 51 y.o.   MRN: [id number]   Advanced Heart Failure VAD Team Note  Subjective:    Admitted 10/3 for RHC/Swan implantation and optimization prior to LVAD.  HeartMate 3 VAD implanted 10/8.  CVVH begun 10/11 CVVH stopped 10/15 Started iHD 10/17  Hgb 7.8.  LDH 286 INR 1.7 Co-ox 64%  Remains on milrinone on 0.25 mcg. No urine output despite Lasix 160 mg IV yesterday. Walked around the nursing unit without difficulty.  Has occasional nausea.   LVAD INTERROGATION:  HeartMate 3:  Flow 4.5 liters/min, speed 5400 rpm, power 3.9, PI 3.2.  No PI events.    Objective:    Vital Signs:   Temp:  [97.6 F (36.4 C)-98.7 F (37.1 C)] 97.6 F (36.4 C) (10/18 0845) Pulse Rate:  [89-106] 106 (10/18 0900) Resp:  [0-36] 27 (10/18 0900) BP: (72-130)/(58-98) 109/84 (10/18 0825) SpO2:  [77 %-100 %] 96 % (10/18 0900) Weight:  [201 lb (91.2 kg)] 201 lb (91.2 kg) (10/18 0400) Last BM Date: 06/21/17 Mean arterial Pressure variable, 67-90s   Intake/Output:   Intake/Output Summary (Last 24 hours) at 06/22/17 0901 Last data filed at 06/22/17 0855  Gross per 24 hour  Intake            885.8 ml  Output                1 ml  Net            884.8 ml     CVP 8-9  Physical Exam: GENERAL: Well appearing this am. NAD.  HEENT: Normal. NECK: Supple, JVP 8 cm. Carotids OK.  CARDIAC:  Mechanical heart sounds with LVAD hum present.  LUNGS:  CTAB, normal effort.  ABDOMEN:  NT, ND, no HSM. No bruits or masses. +BS  LVAD exit site: Well-healed and incorporated. Dressing dry and intact. No erythema or drainage. Stabilization device present and accurately applied. Driveline dressing changed daily per sterile technique. EXTREMITIES:  Warm and dry. No cyanosis, clubbing, rash, or edema.  NEUROLOGIC:  Alert & oriented x 3. Cranial nerves grossly intact. Moves all 4 extremities w/o difficulty. Affect pleasant      Telemetry   NSR around 100 (personally  reviewed).    Labs   Basic Metabolic Panel:  Recent Labs Lab 06/18/17 0315  06/19/17 0326 06/19/17 1207 06/20/17 0351 06/21/17 0215 06/22/17 0219  NA 137  < > 135 131* 129* 127* 127*  K 3.6  < > 4.3 4.1 3.7 3.9 4.0  CL 98*  < > 99* 97* 93* 91* 92*  CO2 25  < > 23 25 23 23 24   GLUCOSE 137*  < > 125* 365* 144* 148* 174*  BUN 15  < > 10 15 20 17  29*  CREATININE 2.44*  < > 2.52* 2.93* 4.52* 4.04* 6.17*  CALCIUM 8.2*  < > 8.0* 7.7* 7.8* 7.7* 8.0*  MG 2.4  --  2.4  --  2.2 1.9 2.0  PHOS 1.5*  < > 1.9* 2.0* 2.6 1.8* 3.4  < > = values in this interval not displayed.  Liver Function Tests:  Recent Labs Lab 06/18/17 0315  06/19/17 0326 06/19/17 1207 06/20/17 0351 06/21/17 0215 06/22/17 0219  AST 33  --  33  --  25 29 24   ALT 19  --  19  --  15* 17 15*  ALKPHOS 132*  --  110  --  113 124 115  BILITOT 1.3*  --  1.5*  --  1.0 1.0 0.7  PROT 7.0  --  6.5  --  6.2* 6.2* 6.3*  ALBUMIN 2.7*  < > 2.5* 2.4* 2.2* 2.2* 2.1*  < > = values in this interval not displayed. No results for input(s): LIPASE, AMYLASE in the last 168 hours. No results for input(s): AMMONIA in the last 168 hours.  CBC:  Recent Labs Lab 06/16/17 0200  06/17/17 0414  06/18/17 0315  06/19/17 0326 06/19/17 1207 06/20/17 0351 06/21/17 0215 06/22/17 0219  WBC 20.1*  < > 14.7*  --  14.9*  --  14.1* 14.4* 16.0* 17.6* 16.0*  NEUTROABS 17.3*  --  11.5*  --  10.3*  --  10.6*  --   --   --   --   HGB 8.1*  < > 8.6*  < > 8.8*  < > 8.6* 8.6* 8.1* 8.0* 7.7*  HCT 22.7*  < > 25.2*  < > 26.5*  < > 25.0* 26.3* 24.2* 24.3* 22.8*  MCV 74.9*  < > 76.4*  --  77.9*  --  79.4 79.5 78.8 78.9 78.9  PLT 144*  < > 219  --  254  --  275 292 321 336 354  < > = values in this interval not displayed.  INR:  Recent Labs Lab 06/18/17 0315 06/19/17 0326 06/20/17 0351 06/21/17 0215 06/22/17 0219  INR 1.99 2.35 2.41 2.13 1.72    Other results:  EKG:    Imaging   Dg Chest Port 1 View  Result Date:  06/22/2017 CLINICAL DATA:  LVAD.  Sore chest EXAM: PORTABLE CHEST 1 VIEW COMPARISON:  Two days ago FINDINGS: Right-sided central lines with tip at the upper right atrium. Dual-chamber ICD/ pacer in stable position. An LVAD is present. Low volume chest with atelectatic opacities greater on the left. Probable small left effusion. No pneumothorax. IMPRESSION: 1. Stable from 2 days prior. 2. Low volume chest with left more than right atelectasis. Electronically Signed   By: Monte Fantasia M.D.   On: 06/22/2017 07:28     Medications:     Scheduled Medications: . amiodarone  400 mg Oral BID  . aspirin EC  325 mg Oral Daily   Or  . aspirin  324 mg Per Tube Daily   Or  . aspirin  300 mg Rectal Daily  . atorvastatin  80 mg Oral q1800  . bisacodyl  10 mg Oral Daily   Or  . bisacodyl  10 mg Rectal Daily  . chlorhexidine  15 mL Mouth Rinse BID  . Chlorhexidine Gluconate Cloth  6 each Topical Daily  . darbepoetin (ARANESP) injection - DIALYSIS  100 mcg Intravenous Q Sat-HD  . docusate sodium  200 mg Oral Daily  . feeding supplement (GLUCERNA SHAKE)  237 mL Oral TID BM  . insulin aspart  0-24 Units Subcutaneous Q4H  . mouth rinse  15 mL Mouth Rinse q12n4p  . mupirocin ointment   Nasal BID  . pantoprazole  40 mg Oral Daily  . sildenafil  20 mg Oral TID  . sodium chloride flush  10-40 mL Intracatheter Q12H  . sodium chloride flush  3 mL Intravenous Q12H  . warfarin  7.5 mg Oral ONCE-1800  . Warfarin - Pharmacist Dosing Inpatient   Does not apply q1800    Infusions: . heparin    . lactated ringers Stopped (06/14/17 1400)  . lactated ringers Stopped (06/15/17 1901)  . milrinone 0.25 mcg/kg/min (06/22/17 0700)  . piperacillin-tazobactam (ZOSYN)  IV 3.375  g (06/22/17 0500)    PRN Medications: heparin, hydrALAZINE, LORazepam, ondansetron (ZOFRAN) IV, oxyCODONE, promethazine, sodium chloride flush, sodium chloride flush, traMADol   Patient Profile   51 yo with history of CAD s/p anterior  MI in 2010 and chronic systolic CHF (ischemic cardiomyopathy) as well as CKD stage III now s/p HeartMate 3 VAD placement.   Assessment/Plan:    1. Acute on chronic systolic CHF: Ischemic cardiomyopathy. St Jude ICD. NSTEMI in March 2018 with DES to LAD and RCA, complicated by cardiogenic shock, low output requiring milrinone. Most recent echo 8/18 with EF 20-25%, moderate MR. He was admitted and started on milrinone with improved creatinine.  HeartMate 3 VAD placement on 10/8. Speed cut back to 5400 with onset of afib/RVR 10/11.   Continues on milrinone 0.25 mcg/kg/min. Co-ox 64%.  - Decrease milrinone to 0.125 today.  - INR 1.7, INR Goal 2-3.  (aim slightly higher with h/o Factor V Leiden heterozygosity) and ASA 325. Will not lower aspirin dose with Factor V Leiden. Heparin gtt while INR < 1.8.  - Continue sildenafil for RV support.  - MAP fairly stable, no changes.  - Will need eventual ramp echo before discharge. 2. AKI on CKD: Still no renal recovery.  Minimal UOP yesterday despite high dose Lasix.  - Tolerated iHD, suspect he will need again today.  Dr. Marval Regal to see.  3. CAD: NSTEMI in 3/18. LHC with 99% ulcerated lesion proximal RCA with left to right collaterals, 95% mid LAD stenosis after mid LAD stent. s/p PCI to RCA and LAD on 11/25/16.  - Off plavix for LVAD.   - Continue statin.   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 Recent V/Q scan did not show acute or chronic PE.  He is a heterozygote for Factor V Leiden, plan for for ASA 325 and warfarin INR 2-3 with LVAD.  5. Chronic N/V/D with gastroparesis: Tolerating diet.  Has occasional nausea.    6. DM: On Jardiance at home, will cover with sliding scale. Glucose stable.   7. S/P Multiple Tooth Extractions 4-15, 19-23, and 27-29.  8. Atrial fibrillation: Paroxysmal. In NSR.  Continue amio 400 mg twice a day.      9. ID: CXR with LLL atelectasis versus infiltrate. Afebrile.  - WBC down slightly to 16. He has now  completed course of vancomycin/Zosyn.   10. Hyponatremia: Sodium down to 127 => fluid restrict. 11. Anemia: Post-op, hgb 7.7.  Will watch, transfuse if goes down further.     I reviewed the LVAD parameters from today, and compared the results to the patient's prior recorded data.  No programming changes were made.  The LVAD is functioning within specified parameters.  The patient performs LVAD self-test daily.  LVAD interrogation was negative for any significant power changes, alarms or PI events/speed drops.  LVAD equipment check completed and is in good working order.  Back-up equipment present.   LVAD education done on emergency procedures and precautions and reviewed exit site care.  Vinal Rosengrant 06/22/2017 9:01 AM

## 2017-06-22 NOTE — Consult Note (Addendum)
Requested by:  Dr. Marval Regal  Reason for consultation: New access   History of Present Illness   [name] is a 51 y.o. ([birth year]) male with LVAD who presents for evaluation for permanent access.  The patient is right hand dominant.  The patient has not had previous access procedures.  Previous central venous cannulation procedures include: right internal jugular vein tri-vascular catheter.  Patient has a left SCV PPM.  Patient had LVAD placed on 06/12/17 by Dr. Darcey Nora.  Patient's kidney function has acutely deteriorate and he had to have trivascular catheter placed on 06/15/17.  Patient has been on CVVHD since then.  Reportedly patient has demonstrated no renal recovery so permanent end stage renal disease is now likely.  TDC and permanent access is requested.   Past Medical History:  Diagnosis Date  . Angina   . ASCVD (arteriosclerotic cardiovascular disease)    , Anterior infarction 2005, LAD diagonal bifurcation intervention 03/2004  . Automatic implantable cardiac defibrillator -St. Jude's       . Benign neoplasm of colon   . CHF (congestive heart failure) ([place])   . Chronic systolic heart failure ([place])   . Coronary artery disease     Widely patent previously placed stents in the left anterior   . Crohn's disease ([place])   . 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 Dyspnea   . Gastroparesis   . GERD (gastroesophageal reflux disease)   . High cholesterol   . Hyperlipidemia   . Hypersomnolent    Previous diagnosis of narcolepsy  . Hypertension, essential   . Ischemic cardiomyopathy    Ejection fraction 15-20% catheterization 2010  . Type II or unspecified type diabetes mellitus without mention of complication, not stated as uncontrolled   . Unspecified gastritis and gastroduodenitis without mention of hemorrhage     Past Surgical History:  Procedure Laterality Date  . APPENDECTOMY    . CARDIAC DEFIBRILLATOR PLACEMENT  2010   St. Jude ICD   . COLECTOMY     "for Crohn's"  . COLON SURGERY    . CORONARY STENT INTERVENTION N/A 11/25/2016   Procedure: Coronary Stent Intervention;  Surgeon: Leonie Man, MD;  Location: [place] [facility];  Service: Cardiovascular;  Laterality: N/A;  . EP IMPLANTABLE DEVICE N/A 09/14/2016   Procedure: ICD Generator Changeout;  Surgeon: Deboraha Sprang, MD;  Location: [place] [facility];  Service: Cardiovascular;  Laterality: N/A;  . FETAL SURGERY FOR CONGENITAL HERNIA    . INGUINAL HERNIA REPAIR    . INSERTION OF IMPLANTABLE LEFT VENTRICULAR ASSIST DEVICE N/A 06/12/2017   Procedure: INSERTION OF IMPLANTABLE LEFT VENTRICULAR ASSIST [place];  Surgeon: Ivin Poot, MD;  Location: [place];  Service: Open Heart Surgery;  Laterality: N/A;  HM3 LVAD  CIRC ARREST  NITRIC OXIDE  . MULTIPLE EXTRACTIONS WITH ALVEOLOPLASTY N/A 06/08/2017   Procedure: MULTIPLE EXTRACTION WITH ALVEOLOPLASTY AND PRE PROSTHETIC SURGERY AS NEEDED;  Surgeon: Lenn Cal, DDS;  Location: [place];  Service: Oral Surgery;  Laterality: N/A;  . RIGHT HEART CATH N/A 06/07/2017   Procedure: RIGHT HEART CATH;  Surgeon: Larey Dresser, MD;  Location: [place] [facility];  Service: Cardiovascular;  Laterality: N/A;  . RIGHT/LEFT HEART CATH AND CORONARY ANGIOGRAPHY N/A 11/23/2016   Procedure: Right/Left Heart Cath and Coronary Angiography;  Surgeon: Larey Dresser, MD;  Location: [place] [facility];  Service: Cardiovascular;  Laterality: N/A;  . TEE WITHOUT CARDIOVERSION N/A 06/12/2017  Procedure: TRANSESOPHAGEAL ECHOCARDIOGRAM (TEE);  Surgeon: Prescott Gum, Collier Salina, MD;  Location: [facility] [place] [facility];  Service: Open Heart Surgery;  Laterality: N/A;    Social History   Social History  . Marital status: Married    Spouse name: N/A  . Number of children: N/A  . Years of education: N/A   Occupational History  . Not on file.   Social History Main Topics  . Smoking status: Never Smoker  . Smokeless tobacco: Never Used  . Alcohol use No   . Drug use: No  . Sexual activity: No   Other Topics Concern  . Not on file   Social History Narrative  . No narrative on file   Family History  Problem Relation Age of Onset  . Colon polyps Mother   . Diabetes Mother   . Heart attack Father   . Diabetes Father   . Stroke Paternal Grandfather   . Colitis Sister   . Heart disease Brother   . Colitis Sister   . Colon cancer Neg Hx     Current Facility-Administered Medications  Medication Dose Route Frequency Provider Last Rate Last Dose  . amiodarone (PACERONE) tablet 400 mg  400 mg Oral BID Prescott Gum, Collier Salina, MD   400 mg at 06/22/17 1151  . aspirin EC tablet 325 mg  325 mg Oral Daily Clegg, Amy D, NP   325 mg at 06/22/17 1151   Or  . aspirin chewable tablet 324 mg  324 mg Per Tube Daily Clegg, Amy D, NP       Or  . aspirin suppository 300 mg  300 mg Rectal Daily Clegg, Amy D, NP      . atorvastatin (LIPITOR) tablet 80 mg  80 mg Oral q1800 Clegg, Amy D, NP   80 mg at 06/21/17 1711  . bisacodyl (DULCOLAX) EC tablet 10 mg  10 mg Oral Daily Prescott Gum, Collier Salina, MD   10 mg at 06/17/17 1057   Or  . bisacodyl (DULCOLAX) suppository 10 mg  10 mg Rectal Daily Prescott Gum, Collier Salina, MD      . chlorhexidine (PERIDEX) 0.12 % solution 15 mL  15 mL Mouth Rinse BID Ivin Poot, MD   15 mL at 06/22/17 [phone]  . Chlorhexidine Gluconate Cloth 2 % PADS 6 each  6 each Topical Daily Ivin Poot, MD   6 each at 06/22/17 1152  . Darbepoetin Alfa (ARANESP) injection 100 mcg  100 mcg Intravenous Q Sat-HD Edrick Oh, MD   100 mcg at 06/17/17 1542  . docusate sodium (COLACE) capsule 200 mg  200 mg Oral Daily Prescott Gum, Collier Salina, MD   200 mg at 06/17/17 1057  . feeding supplement (GLUCERNA SHAKE) (GLUCERNA SHAKE) liquid 237 mL  237 mL Oral TID BM Prescott Gum, Collier Salina, MD   237 mL at 06/22/17 1152  . heparin ADULT infusion 100 units/mL (25000 units/224m sodium chloride 0.45%)  800 Units/hr Intravenous Continuous BEinar Grad RPH 8 mL/hr at 06/22/17 1000  800 Units/hr at 06/22/17 1000  . heparin injection 1,800 Units  20 Units/kg Dialysis PRN CDonato Heinz MD      . hydrALAZINE (APRESOLINE) injection 10 mg  10 mg Intravenous Q4H PRN VIvin Poot MD   10 mg at 06/22/17 0503  . insulin aspart (novoLOG) injection 0-24 Units  0-24 Units Subcutaneous Q4H VIvin Poot MD   2 Units at 06/22/17 1230  . lactated ringers infusion   Intravenous Continuous VIvin Poot MD   Stopped at  06/14/17 1400  . lactated ringers infusion   Intravenous Continuous Ivin Poot, MD   Stopped at 06/15/17 1901  . LORazepam (ATIVAN) injection 0.5 mg  0.5 mg Intravenous Q8H PRN Ivin Poot, MD   0.5 mg at 06/21/17 0114  . MEDLINE mouth rinse  15 mL Mouth Rinse q12n4p Prescott Gum, Collier Salina, MD   15 mL at 06/22/17 1617  . milrinone (PRIMACOR) 20 MG/100 ML (0.2 mg/mL) infusion  0.125 mcg/kg/min Intravenous Continuous Larey Dresser, MD 3.7 mL/hr at 06/22/17 1000 0.125 mcg/kg/min at 06/22/17 1000  . mupirocin ointment (BACTROBAN) 2 %   Nasal BID Prescott Gum, Collier Salina, MD      . ondansetron [facility]) injection 4 mg  4 mg Intravenous Q6H PRN Ivin Poot, MD   4 mg at 06/22/17 0350  . oxyCODONE (Oxy IR/ROXICODONE) immediate release tablet 5-10 mg  5-10 mg Oral Q3H PRN Ivin Poot, MD   5 mg at 06/17/17 0347  . pantoprazole (PROTONIX) EC tablet 40 mg  40 mg Oral Daily Prescott Gum, Collier Salina, MD   40 mg at 06/22/17 1151  . piperacillin-tazobactam (ZOSYN) IVPB 3.375 g  3.375 g Intravenous Q12H Einar Grad, [facility]   Stopped at 06/22/17 0900  . promethazine (PHENERGAN) injection 12.5 mg  12.5 mg Intravenous Q4H PRN Ivin Poot, MD   12.5 mg at 06/18/17 0720  . sildenafil (REVATIO) tablet 20 mg  20 mg Oral TID Larey Dresser, MD   20 mg at 06/22/17 1621  . sodium chloride flush (NS) 0.9 % injection 10-40 mL  10-40 mL Intracatheter Q12H Prescott Gum, Collier Salina, MD   20 mL at 06/22/17 1151  . sodium chloride flush (NS) 0.9 % injection 10-40 mL  10-40 mL  Intracatheter PRN Prescott Gum, Collier Salina, MD      . sodium chloride flush (NS) 0.9 % injection 3 mL  3 mL Intravenous Q12H Prescott Gum, Collier Salina, MD   3 mL at 06/22/17 1152  . sodium chloride flush (NS) 0.9 % injection 3 mL  3 mL Intravenous PRN Prescott Gum, Collier Salina, MD      . traMADol Veatrice Bourbon) tablet 50-100 mg  50-100 mg Oral Q4H PRN Prescott Gum, Collier Salina, MD      . warfarin (COUMADIN) tablet 5 mg  5 mg Oral ONCE-1800 Einar Grad, [facility]      . Warfarin - Pharmacist Dosing Inpatient   Does not apply q1800 Carney, Gay Filler, [facility]        Allergies  Allergen Reactions  . Metformin And Related Diarrhea    REVIEW OF SYSTEMS (negative unless checked):   Cardiac:  []  Chest pain or chest pressure? [x]  Shortness of breath upon activity? [x]  Shortness of breath when lying flat? []  Irregular heart rhythm?  Vascular:  []  Pain in calf, thigh, or hip brought on by walking? []  Pain in feet at night that wakes you up from your sleep? [x]  Blood clot in your veins? [x]  Leg swelling?  Pulmonary:  []  Oxygen at home? []  Productive cough? []  Wheezing?  Neurologic:  []  Sudden weakness in arms or legs? []  Sudden numbness in arms or legs? []  Sudden onset of difficult speaking or slurred speech? []  Temporary loss of vision in one eye? []  Problems with dizziness?  Gastrointestinal:  []  Blood in stool? []  Vomited blood?  Genitourinary:  []  Burning when urinating? []  Blood in urine?  Psychiatric:  []  Major depression  Hematologic:  []  Bleeding problems? []  Problems with blood clotting?  Dermatologic:  []   Rashes or ulcers?  Constitutional:  []  Fever or chills?  Ear/Nose/Throat:  []  Change in hearing? []  Nose bleeds? []  Sore throat?  Musculoskeletal:  []  Back pain? []  Joint pain? []  Muscle pain?   Physical Examination     Vitals:   06/22/17 1530 06/22/17 1545 06/22/17 1555 06/22/17 1600  BP: 110/75 101/74 96/75 95/83   Pulse: (!) 103 (!) 101 99 (!) 101  Resp: 18 18 17 12   Temp:   98.1  F (36.7 C)   TempSrc:      SpO2: 95% 94% 94% 95%  Weight:   209 lb 14.1 oz (95.2 kg)   Height:       Body mass index is 31.91 kg/m.  General Alert, O x 3, WD, NAD  Head [place]/AT,    Ear/Nose/ Throat Hearing grossly intact, nares without erythema or drainage,   Eyes PERRLA, EOMI,    Neck Supple, mid-line trachea,    Pulmonary Sym exp, good B air movt, CTA B  Cardiac Mechanical sound consistent with LVAD, no murmur,   Vascular Vessel Right Left  Radial Not palpable Not palpable  Brachial Not palpable Not palpable  Carotid Not palpable Not palpable  Aorta Not palpable N/A  Femoral Not palpable Not palpable  Popliteal Not palpable Not palpable  PT Not palpable Not palpable  DP Not palpable Not palpable    Gastro- intestinal soft, non-distended, non-tender to palpation, No guarding or rebound, healed surgical incision  Musculo- skeletal Viable limbs throughout, limited MS exam, able to lift limbs against gravity  Neurologic Cranial nerves grosslyintact , Pain and light touch intact in extremities , Motor exam as listed above  Psychiatric Judgement intact, Mood & affect appropriate for pt's clinical situation  Dermatologic See M/S exam for extremity exam, No rashes otherwise noted  Lymphatic  Palpable lymph nodes: None     Non-invasive Vascular Imaging   BUE Vein Mapping  Ordered   Laboratory   CBC CBC Latest Ref Rng & Units 06/22/2017 06/21/2017 06/20/2017  WBC 4.0 - 10.5 K/uL 16.0(H) 17.6(H) 16.0(H)  Hemoglobin 13.0 - 17.0 g/dL 7.7(L) 8.0(L) 8.1(L)  Hematocrit 39.0 - 52.0 % 22.8(L) 24.3(L) 24.2(L)  Platelets 150 - 400 K/uL 354 336 321    BMP BMP Latest Ref Rng & Units 06/22/2017 06/21/2017 06/20/2017  Glucose 65 - 99 mg/dL 174(H) 148(H) 144(H)  BUN 6 - 20 mg/dL 29(H) 17 20  Creatinine 0.61 - 1.24 mg/dL 6.17(H) 4.04(H) 4.52(H)  BUN/Creat Ratio 9 - 20 - - -  Sodium 135 - 145 mmol/L 127(L) 127(L) 129(L)  Potassium 3.5 - 5.1 mmol/L 4.0 3.9 3.7  Chloride 101 - 111  mmol/L 92(L) 91(L) 93(L)  CO2 22 - 32 mmol/L 24 23 23   Calcium 8.9 - 10.3 mg/dL 8.0(L) 7.7(L) 7.8(L)    Coagulation Lab Results  Component Value Date   INR 1.72 06/22/2017   INR 2.13 06/21/2017   INR 2.41 06/20/2017   No results found for: PTT  Lipids    Component Value Date/Time   CHOL 128 05/04/2017 1446   TRIG 118 05/04/2017 1446   HDL 50 05/04/2017 1446   CHOLHDL 2.6 05/04/2017 1446   VLDL 24 05/04/2017 1446   LDLCALC 54 05/04/2017 1446    Radiology     Dg Chest Port 1 View  Result Date: 06/22/2017 CLINICAL DATA:  LVAD.  Sore chest EXAM: PORTABLE CHEST 1 VIEW COMPARISON:  Two days ago FINDINGS: Right-sided central lines with tip at the upper right atrium. Dual-chamber ICD/ pacer in stable  position. An LVAD is present. Low volume chest with atelectatic opacities greater on the left. Probable small left effusion. No pneumothorax. IMPRESSION: 1. Stable from 2 days prior. 2. Low volume chest with left more than right atelectasis. Electronically Signed   By: Monte Fantasia M.D.   On: 06/22/2017 07:28     Medical Decision Making   [name] is a 51 y.o. male who presents with acute on chronic kidney disease, possible permanent ESRD, heart failure s/p LVAD, s/p prior L SCV PPM   Vein mapping is pending.  Presence of Left SCV pacer wires will limit the patency of any left sided access.  From review of chart, some further medical optimization is in progress.  Will plan on [facility] and R side access placement on Monday, 22 OCT 18.  Please keep patient on heparin drip to facilitate the procedure on Monday  I had an extensive discussion with this patient in regards to the nature of access surgery, including risk, benefits, and alternatives.    The patient is aware that the risks of access surgery include but are not limited to: bleeding, infection, steal syndrome, nerve damage, ischemic monomelic neuropathy, failure of access to mature, complications related to venous  hypertension, and possible need for additional access procedures in the future.  The patient has agreed to proceed with the above procedures.   Adele Barthel, MD, FACS [facility] Office: [phone] Pager: [phone]  06/22/2017, 4:58 PM

## 2017-06-22 NOTE — Progress Notes (Signed)
Dialysis treatment completed.  2000 mL ultrafiltrated.  1500 mL net fluid removal.  Patient status unchanged. Lung sounds diminished to ausculation in all fields. Generalized non pitting edema. Cardiac: ST.  Cleansed RIJ catheter with chlorhexidine.  Disconnected lines and flushed ports with saline per protocol.  Ports locked with hepairn and capped per protocol.    Report given to bedside, RN Hyiu.

## 2017-06-22 NOTE — Progress Notes (Signed)
HeartMate 3 Rounding Note   postop day #10  Subjective:    HeartMate 3 implantation October 8 with preoperative ischemic cardiomyopathy, advanced heart failure on IV inotropes, indication was destination therapy  Patient stable, extubated  postop day 1.No bleeding.Started on low dose heparin bridge to coumadin  Today INR 1.7- will cover with iv heparin   Acute on chronic renal failure with wt gain, increased volume, increased pulsatility- VAD speed up to 5600- now back to 5400 rpm with less PI events  Volume overload improved with CRT.   Walking in hall  Oliguric Acute renal failure with rising cvp- trialysis cath placed 10-11 and CRT started goal 100 -200cc/hr removal for increased wt, cvp With wt and CVP normal will transition to intermittent HD and follow renal recovery  Hx factor V Leiden deficiency. Now  with po coumadin-  INR  Goal2.5, dose per pharmD  Minimal urine output after CRT stopped with creat up to 4.2 Intermittent HD     stop clonidine. Still no urine output  After lasix 160 mg so patient will need dialysis today  LVAD INTERROGATION:  HeartMate IIILVAD:  Flow 4.2 liters/min, speed 5400, power 4.0, PI 3.0.  Controller Serial intact.   Objective:    Vital Signs:   Temp:  [97.6 F (36.4 C)-98.7 F (37.1 C)] 98.4 F (36.9 C) (10/18 1209) Pulse Rate:  [50-107] 99 (10/18 1209) Resp:  [0-36] 19 (10/18 1209) BP: (72-130)/(58-98) 97/78 (10/18 1209) SpO2:  [77 %-100 %] 95 % (10/18 1209) Weight:  [201 lb (91.2 kg)] 201 lb (91.2 kg) (10/18 0400) Last BM Date: 06/21/17 Mean arterial Pressure 85 mmHg  Intake/Output:   Intake/Output Summary (Last 24 hours) at 06/22/17 1210 Last data filed at 06/22/17 1100  Gross per 24 hour  Intake            648.2 ml  Output                0 ml  Net            648.2 ml     Physical Exam: General:  Well appearing. No resp difficulty HEENT: normal Neck: supple. JVP .; no bruits. No lymphadenopathy or thryomegaly appreciated. Cor:  Mechanical heart sounds with LVAD hum present. Lungs: clear Abdomen: soft, nontender, nondistended. No hepatosplenomegaly. No bruits or masses. Good bowel sounds. Extremities: no cyanosis, clubbing, rash, mild edema of the extremities edema Neuro: alert & orientedx3, cranial nerves grossly intact. moves all 4 extremities w/o difficulty. Affect pleasant  Telemetry:nsr 90  Labs: Basic Metabolic Panel:  Recent Labs Lab 06/18/17 0315  06/19/17 0326 06/19/17 1207 06/20/17 0351 06/21/17 0215 06/22/17 0219  NA 137  < > 135 131* 129* 127* 127*  K 3.6  < > 4.3 4.1 3.7 3.9 4.0  CL 98*  < > 99* 97* 93* 91* 92*  CO2 25  < > 23 25 23 23 24   GLUCOSE 137*  < > 125* 365* 144* 148* 174*  BUN 15  < > 10 15 20 17  29*  CREATININE 2.44*  < > 2.52* 2.93* 4.52* 4.04* 6.17*  CALCIUM 8.2*  < > 8.0* 7.7* 7.8* 7.7* 8.0*  MG 2.4  --  2.4  --  2.2 1.9 2.0  PHOS 1.5*  < > 1.9* 2.0* 2.6 1.8* 3.4  < > = values in this interval not displayed.  Liver Function Tests:  Recent Labs Lab 06/18/17 0315  06/19/17 0326 06/19/17 1207 06/20/17 0351 06/21/17 0215 06/22/17 0219  AST 33  --  33  --  25 29 24   ALT 19  --  19  --  15* 17 15*  ALKPHOS 132*  --  110  --  113 124 115  BILITOT 1.3*  --  1.5*  --  1.0 1.0 0.7  PROT 7.0  --  6.5  --  6.2* 6.2* 6.3*  ALBUMIN 2.7*  < > 2.5* 2.4* 2.2* 2.2* 2.1*  < > = values in this interval not displayed. No results for input(s): LIPASE, AMYLASE in the last 168 hours. No results for input(s): AMMONIA in the last 168 hours.  CBC:  Recent Labs Lab 06/16/17 0200  06/17/17 0414  06/18/17 0315  06/19/17 0326 06/19/17 1207 06/20/17 0351 06/21/17 0215 06/22/17 0219  WBC 20.1*  < > 14.7*  --  14.9*  --  14.1* 14.4* 16.0* 17.6* 16.0*  NEUTROABS 17.3*  --  11.5*  --  10.3*  --  10.6*  --   --   --   --   HGB 8.1*  < > 8.6*  < > 8.8*  < > 8.6* 8.6* 8.1* 8.0* 7.7*  HCT 22.7*  < > 25.2*  < > 26.5*  < > 25.0* 26.3* 24.2* 24.3* 22.8*  MCV 74.9*  < > 76.4*  --  77.9*  --   79.4 79.5 78.8 78.9 78.9  PLT 144*  < > 219  --  254  --  275 292 321 336 354  < > = values in this interval not displayed.  INR:  Recent Labs Lab 06/18/17 0315 06/19/17 0326 06/20/17 0351 06/21/17 0215 06/22/17 0219  INR 1.99 2.35 2.41 2.13 1.72    Other results:  EKG:   Imaging: Dg Chest Port 1 View  Result Date: 06/22/2017 CLINICAL DATA:  LVAD.  Sore chest EXAM: PORTABLE CHEST 1 VIEW COMPARISON:  Two days ago FINDINGS: Right-sided central lines with tip at the upper right atrium. Dual-chamber ICD/ pacer in stable position. An LVAD is present. Low volume chest with atelectatic opacities greater on the left. Probable small left effusion. No pneumothorax. IMPRESSION: 1. Stable from 2 days prior. 2. Low volume chest with left more than right atelectasis. Electronically Signed   By: Monte Fantasia M.D.   On: 06/22/2017 07:28     Medications:     Scheduled Medications: . amiodarone  400 mg Oral BID  . aspirin EC  325 mg Oral Daily   Or  . aspirin  324 mg Per Tube Daily   Or  . aspirin  300 mg Rectal Daily  . atorvastatin  80 mg Oral q1800  . bisacodyl  10 mg Oral Daily   Or  . bisacodyl  10 mg Rectal Daily  . chlorhexidine  15 mL Mouth Rinse BID  . Chlorhexidine Gluconate Cloth  6 each Topical Daily  . darbepoetin (ARANESP) injection - DIALYSIS  100 mcg Intravenous Q Sat-HD  . docusate sodium  200 mg Oral Daily  . feeding supplement (GLUCERNA SHAKE)  237 mL Oral TID BM  . insulin aspart  0-24 Units Subcutaneous Q4H  . mouth rinse  15 mL Mouth Rinse q12n4p  . mupirocin ointment   Nasal BID  . pantoprazole  40 mg Oral Daily  . sildenafil  20 mg Oral TID  . sodium chloride flush  10-40 mL Intracatheter Q12H  . sodium chloride flush  3 mL Intravenous Q12H  . warfarin  7.5 mg Oral ONCE-1800  . Warfarin - Pharmacist Dosing Inpatient   Does not apply [phone]  Infusions: . heparin 800 Units/hr (06/22/17 1000)  . lactated ringers Stopped (06/14/17 1400)  . lactated  ringers Stopped (06/15/17 1901)  . milrinone 0.125 mcg/kg/min (06/22/17 1000)  . piperacillin-tazobactam (ZOSYN)  IV Stopped (06/22/17 0900)    PRN Medications: heparin, hydrALAZINE, LORazepam, ondansetron (ZOFRAN) IV, oxyCODONE, promethazine, sodium chloride flush, sodium chloride flush, traMADol   Assessment:  HeartMate 3 VAD implant for ischemic cardiomyopathy Preoperative ventricular ectopy with permanent pacemaker-AICD Preoperative moderate chronic kidney disease stage III Preoperative hemicolectomy for granulomatous colitis Poorly controlled diabetes mellitus with hemoglobin A1c 10.0, neuropathy Peripheral vascular disease with ABIs 0.5 bilaterally  postoperative acute renal failure with rising creatinine 4.2 and oliguria- HD cath placed and CRT applied 10-12 - 10-15 Postop atrial fib on amiodarone Excellent VAD performance postop  Plan/Discussion:      Weaning milrinone for RV function   HD today with rising creat and oliguria   NSR - on po amiodarone   All chest drains out - incisions clean, dry  Adv diet to mech soft Cont coumadin, 325 ASA Coumadin  7. 5 mg ordered for today   I reviewed the LVAD parameters from today, and compared the results to the patient's prior recorded data.  No programming changes were made.  The LVAD is functioning within specified parameters.  The patient performs LVAD self-test daily.  LVAD interrogation was negative for any significant power changes, alarms or PI events/speed drops.  LVAD equipment check completed and is in good working order.  Back-up equipment present.   LVAD education done on emergency procedures and precautions and reviewed exit site care.  Length of Stay: [place] [facility] 06/22/2017, 12:10 PM

## 2017-06-22 NOTE — Progress Notes (Addendum)
Patient ID: [name], male   DOB: April 01, 1966, 51 y.o.   MRN: [id number] S: Feels well but no UOP despite high dose lasix IV.  O:BP 109/84 (BP Location: Left Arm)   Pulse (!) 106   Temp 97.6 F (36.4 C) (Oral)   Resp (!) 27   Ht 5' 8"  (1.727 m)   Wt 91.2 kg (201 lb)   SpO2 96%   BMI 30.56 kg/m   Intake/Output Summary (Last 24 hours) at 06/22/17 0909 Last data filed at 06/22/17 0855  Gross per 24 hour  Intake            885.8 ml  Output                1 ml  Net            884.8 ml   Intake/Output: I/O last 3 completed shifts: In: 1308.7 [P.O.:714; I.V.:378.7; IV Piggyback:216] Out: 1 [Stool:1]  Intake/Output this shift:  Total I/O In: 30 [Blood:30] Out: -  Weight change: 1.073 kg (2 lb 5.9 oz) Gen: NAD CVS: tachy, mechanical hum Resp: cta MGN:OIBBCW Ext:no edema   Recent Labs Lab 06/16/17 0200  06/17/17 0414  06/18/17 0315 06/18/17 1624 06/18/17 1626 06/19/17 0326 06/19/17 1207 06/20/17 0351 06/21/17 0215 06/22/17 0219  NA 132*  < > 137  136  < > 137 136 137 135 131* 129* 127* 127*  K 4.2  < > 4.2  5.0  < > 3.6 4.4 4.4 4.3 4.1 3.7 3.9 4.0  CL 96*  < > 99*  99*  < > 98* 100* 100* 99* 97* 93* 91* 92*  CO2 22  < > 26  25  < > 25 27  --  23 25 23 23 24   GLUCOSE 96  < > 152*  148*  < > 137* 116* 120* 125* 365* 144* 148* 174*  BUN 29*  < > 21*  20  < > 15 12 14 10 15 20 17  29*  CREATININE 3.71*  < > 2.57*  2.56*  < > 2.44* 2.44* 2.40* 2.52* 2.93* 4.52* 4.04* 6.17*  ALBUMIN 2.7*  < > 2.7*  2.6*  < > 2.7* 2.5*  --  2.5* 2.4* 2.2* 2.2* 2.1*  CALCIUM 8.1*  < > 8.1*  8.0*  < > 8.2* 7.9*  --  8.0* 7.7* 7.8* 7.7* 8.0*  PHOS 4.6  < > 2.2*  < > 1.5* 2.0*  --  1.9* 2.0* 2.6 1.8* 3.4  AST 36  --  33  --  33  --   --  33  --  25 29 24   ALT 19  --  21  --  19  --   --  19  --  15* 17 15*  < > = values in this interval not displayed. Liver Function Tests:  Recent Labs Lab 06/20/17 0351 06/21/17 0215 06/22/17 0219  AST 25 29 24   ALT 15* 17 15*  ALKPHOS  113 124 115  BILITOT 1.0 1.0 0.7  PROT 6.2* 6.2* 6.3*  ALBUMIN 2.2* 2.2* 2.1*   No results for input(s): LIPASE, AMYLASE in the last 168 hours. No results for input(s): AMMONIA in the last 168 hours. CBC:  Recent Labs Lab 06/17/17 0414  06/18/17 0315  06/19/17 0326 06/19/17 1207 06/20/17 0351 06/21/17 0215 06/22/17 0219  WBC 14.7*  --  14.9*  --  14.1* 14.4* 16.0* 17.6* 16.0*  NEUTROABS 11.5*  --  10.3*  --  10.6*  --   --   --   --  HGB 8.6*  < > 8.8*  < > 8.6* 8.6* 8.1* 8.0* 7.7*  HCT 25.2*  < > 26.5*  < > 25.0* 26.3* 24.2* 24.3* 22.8*  MCV 76.4*  --  77.9*  --  79.4 79.5 78.8 78.9 78.9  PLT 219  --  254  --  275 292 321 336 354  < > = values in this interval not displayed. Cardiac Enzymes:  Recent Labs Lab 06/15/17 1218  CKTOTAL 309   CBG:  Recent Labs Lab 06/21/17 1224 06/21/17 1539 06/21/17 2342 06/22/17 0437 06/22/17 0829  GLUCAP 237* 211* 165* 181* 159*    Iron Studies: No results for input(s): IRON, TIBC, TRANSFERRIN, FERRITIN in the last 72 hours. Studies/Results: Dg Chest Port 1 View  Result Date: 06/22/2017 CLINICAL DATA:  LVAD.  Sore chest EXAM: PORTABLE CHEST 1 VIEW COMPARISON:  Two days ago FINDINGS: Right-sided central lines with tip at the upper right atrium. Dual-chamber ICD/ pacer in stable position. An LVAD is present. Low volume chest with atelectatic opacities greater on the left. Probable small left effusion. No pneumothorax. IMPRESSION: 1. Stable from 2 days prior. 2. Low volume chest with left more than right atelectasis. Electronically Signed   By: Monte Fantasia M.D.   On: 06/22/2017 07:28   . amiodarone  400 mg Oral BID  . aspirin EC  325 mg Oral Daily   Or  . aspirin  324 mg Per Tube Daily   Or  . aspirin  300 mg Rectal Daily  . atorvastatin  80 mg Oral q1800  . bisacodyl  10 mg Oral Daily   Or  . bisacodyl  10 mg Rectal Daily  . chlorhexidine  15 mL Mouth Rinse BID  . Chlorhexidine Gluconate Cloth  6 each Topical Daily  .  darbepoetin (ARANESP) injection - DIALYSIS  100 mcg Intravenous Q Sat-HD  . docusate sodium  200 mg Oral Daily  . feeding supplement (GLUCERNA SHAKE)  237 mL Oral TID BM  . insulin aspart  0-24 Units Subcutaneous Q4H  . mouth rinse  15 mL Mouth Rinse q12n4p  . mupirocin ointment   Nasal BID  . pantoprazole  40 mg Oral Daily  . sildenafil  20 mg Oral TID  . sodium chloride flush  10-40 mL Intracatheter Q12H  . sodium chloride flush  3 mL Intravenous Q12H  . warfarin  7.5 mg Oral ONCE-1800  . Warfarin - Pharmacist Dosing Inpatient   Does not apply q1800    BMET    Component Value Date/Time   NA 127 (L) 06/22/2017 0219   NA 144 05/11/2017 0938   K 4.0 06/22/2017 0219   CL 92 (L) 06/22/2017 0219   CO2 24 06/22/2017 0219   GLUCOSE 174 (H) 06/22/2017 0219   BUN 29 (H) 06/22/2017 0219   BUN 68 (H) 05/11/2017 0938   CREATININE 6.17 (H) 06/22/2017 0219   CREATININE 1.63 (H) 11/09/2016 1505   CALCIUM 8.0 (L) 06/22/2017 0219   GFRNONAA 9 (L) 06/22/2017 0219   GFRNONAA 48 (L) 11/09/2016 1505   GFRAA 11 (L) 06/22/2017 0219   GFRAA 55 (L) 11/09/2016 1505   CBC    Component Value Date/Time   WBC 16.0 (H) 06/22/2017 0219   RBC 2.89 (L) 06/22/2017 0219   HGB 7.7 (L) 06/22/2017 0219   HGB 11.7 (L) 09/07/2016 1457   HCT 22.8 (L) 06/22/2017 0219   HCT 37.5 09/07/2016 1457   PLT 354 06/22/2017 0219   PLT 279 09/07/2016 1457   MCV  78.9 06/22/2017 0219   MCV 81 09/07/2016 1457   MCH 26.6 06/22/2017 0219   MCHC 33.8 06/22/2017 0219   RDW 17.1 (H) 06/22/2017 0219   RDW 14.9 09/07/2016 1457   LYMPHSABS 1.6 06/19/2017 0326   LYMPHSABS 1.3 09/07/2016 1457   MONOABS 1.8 (H) 06/19/2017 0326   EOSABS 0.1 06/19/2017 0326   EOSABS 0.2 09/07/2016 1457   BASOSABS 0.0 06/19/2017 0326   BASOSABS 0.0 09/07/2016 1457     Assessment/Plan:  1. AKI/CKD oliguric/anuric in setting of cardiogenic shock and +/- vanc toxicity. Has tolerated CVVHD since 06/15/17 with marked improvement in volume (net  negative 7 liters over a3 day period) and stopped 06/19/17. Unfortunately he has remained anuric  1. S/p first IHD 06/20/17 with some drop in BP and only negative 750 2. Did not respond to dose of lasix 161m IV x 1 to see if he will start producing urine now that BP's have improved 3. Plan for HD on TTS schedule for now and follow UOP. 2. Vascular access- will need tunneled HD catheter and AVF/AVG since he remains dialysis-dependent, will consult VVS 3. 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  4. CAD s/p NSTEMI 3/18. Off plavix due to LVAD 5. H/o DVT's on coumadin  6. DM- per primary svc 7. ABLA/Anemia of CKD- follow H/H, on Aranesp. Transfuse prn 8. A fib- per Cardiology, on amiodarone.  JDonetta Potts MD [employer]([phone]

## 2017-06-23 ENCOUNTER — Inpatient Hospital Stay (HOSPITAL_COMMUNITY): Payer: BLUE CROSS/BLUE SHIELD

## 2017-06-23 ENCOUNTER — Encounter (HOSPITAL_COMMUNITY): Payer: BLUE CROSS/BLUE SHIELD

## 2017-06-23 DIAGNOSIS — Z01810 Encounter for preprocedural cardiovascular examination: Secondary | ICD-10-CM

## 2017-06-23 DIAGNOSIS — N179 Acute kidney failure, unspecified: Secondary | ICD-10-CM

## 2017-06-23 LAB — COMPREHENSIVE METABOLIC PANEL
ALT: 16 U/L — ABNORMAL LOW (ref 17–63)
AST: 27 U/L (ref 15–41)
Albumin: 2.2 g/dL — ABNORMAL LOW (ref 3.5–5.0)
Alkaline Phosphatase: 124 U/L (ref 38–126)
Anion gap: 8 (ref 5–15)
BUN: 16 mg/dL (ref 6–20)
CO2: 27 mmol/L (ref 22–32)
Calcium: 7.9 mg/dL — ABNORMAL LOW (ref 8.9–10.3)
Chloride: 96 mmol/L — ABNORMAL LOW (ref 101–111)
Creatinine, Ser: 4.51 mg/dL — ABNORMAL HIGH (ref 0.61–1.24)
GFR calc Af Amer: 16 mL/min — ABNORMAL LOW (ref >60)
GFR calc non Af Amer: 14 mL/min — ABNORMAL LOW (ref >60)
Glucose, Bld: 87 mg/dL (ref 65–99)
Potassium: 4 mmol/L (ref 3.5–5.1)
Sodium: 131 mmol/L — ABNORMAL LOW (ref 135–145)
Total Bilirubin: 0.6 mg/dL (ref 0.3–1.2)
Total Protein: 6.3 g/dL — ABNORMAL LOW (ref 6.5–8.1)

## 2017-06-23 LAB — CBC
HCT: 26.7 % — ABNORMAL LOW (ref 39.0–52.0)
Hemoglobin: 8.9 g/dL — ABNORMAL LOW (ref 13.0–17.0)
MCH: 26.8 pg (ref 26.0–34.0)
MCHC: 33.3 g/dL (ref 30.0–36.0)
MCV: 80.4 fL (ref 78.0–100.0)
Platelets: 406 10*3/uL — ABNORMAL HIGH (ref 150–400)
RBC: 3.32 MIL/uL — ABNORMAL LOW (ref 4.22–5.81)
RDW: 17.4 % — ABNORMAL HIGH (ref 11.5–15.5)
WBC: 16.1 10*3/uL — ABNORMAL HIGH (ref 4.0–10.5)

## 2017-06-23 LAB — GLUCOSE, CAPILLARY
GLUCOSE-CAPILLARY: 138 mg/dL — AB (ref 65–99)
Glucose-Capillary: 110 mg/dL — ABNORMAL HIGH (ref 65–99)
Glucose-Capillary: 167 mg/dL — ABNORMAL HIGH (ref 65–99)
Glucose-Capillary: 194 mg/dL — ABNORMAL HIGH (ref 65–99)
Glucose-Capillary: 198 mg/dL — ABNORMAL HIGH (ref 65–99)
Glucose-Capillary: 208 mg/dL — ABNORMAL HIGH (ref 65–99)

## 2017-06-23 LAB — TYPE AND SCREEN
ABO/RH(D): O POS
Antibody Screen: NEGATIVE
Unit division: 0

## 2017-06-23 LAB — COOXEMETRY PANEL
Carboxyhemoglobin: 1.9 % — ABNORMAL HIGH (ref 0.5–1.5)
Carboxyhemoglobin: 1.3 % (ref 0.5–1.5)
Methemoglobin: 1 % (ref 0.0–1.5)
Methemoglobin: 1 % (ref 0.0–1.5)
O2 Saturation: 58.8 %
O2 Saturation: 59.9 %
Total hemoglobin: 9.6 g/dL — ABNORMAL LOW (ref 12.0–16.0)
Total hemoglobin: 9.9 g/dL — ABNORMAL LOW (ref 12.0–16.0)

## 2017-06-23 LAB — BPAM RBC
Blood Product Expiration Date: 201811152359
ISSUE DATE / TIME: 201810180832
Unit Type and Rh: 5100

## 2017-06-23 LAB — PROTIME-INR
INR: 1.89
Prothrombin Time: 21.5 seconds — ABNORMAL HIGH (ref 11.4–15.2)

## 2017-06-23 LAB — LACTATE DEHYDROGENASE: LDH: 284 U/L — AB (ref 98–192)

## 2017-06-23 LAB — MAGNESIUM: MAGNESIUM: 1.9 mg/dL (ref 1.7–2.4)

## 2017-06-23 LAB — HEPARIN LEVEL (UNFRACTIONATED)
Heparin Unfractionated: 0.31 IU/mL (ref 0.30–0.70)
Heparin Unfractionated: 0.3 IU/mL (ref 0.30–0.70)

## 2017-06-23 NOTE — Progress Notes (Signed)
Patient ID: [name], male   DOB: 23-Nov-1965, 51 y.o.   MRN: [id number] S: Feels well and understands about the vascular access procedures scheduled on Monday O:BP 112/84 (BP Location: Left Arm)   Pulse 100   Temp 98.5 F (36.9 C) (Oral)   Resp (!) 39   Ht 5' 8"  (1.727 m)   Wt 93.9 kg (207 lb) Comment: Pt weighed with vest, controller, and portable batteries  SpO2 (!) 89%   BMI 31.47 kg/m   Intake/Output Summary (Last 24 hours) at 06/23/17 0914 Last data filed at 06/23/17 0903  Gross per 24 hour  Intake          1956.12 ml  Output             1500 ml  Net           456.12 ml   Intake/Output: I/O last 3 completed shifts: In: 2065.6 [P.O.:1260; I.V.:383.1; Blood:322.5; IV Piggyback:100] Out: 1500 [Other:1500]  Intake/Output this shift:  Total I/O In: 63 [P.O.:30; I.V.:33] Out: -  Weight change: 5.527 kg (12 lb 3 oz) Gen: NAD CVS: mechanical hum Resp:cta MGQ:QPYPPJ Ext: no edema   Recent Labs Lab 06/17/17 0414  06/18/17 0315 06/18/17 1624  06/19/17 0326 06/19/17 1207 06/20/17 0351 06/21/17 0215 06/22/17 0219 06/22/17 1649 06/23/17 0300  NA 137  136  < > 137 136  < > 135 131* 129* 127* 127* 134* 131*  K 4.2  5.0  < > 3.6 4.4  < > 4.3 4.1 3.7 3.9 4.0 4.0 4.0  CL 99*  99*  < > 98* 100*  < > 99* 97* 93* 91* 92* 97* 96*  CO2 26  25  < > 25 27  --  23 25 23 23 24 28 27   GLUCOSE 152*  148*  < > 137* 116*  < > 125* 365* 144* 148* 174* 102* 87  BUN 21*  20  < > 15 12  < > 10 15 20 17  29* 9 16  CREATININE 2.57*  2.56*  < > 2.44* 2.44*  < > 2.52* 2.93* 4.52* 4.04* 6.17* 3.16* 4.51*  ALBUMIN 2.7*  2.6*  < > 2.7* 2.5*  --  2.5* 2.4* 2.2* 2.2* 2.1* 2.2* 2.2*  CALCIUM 8.1*  8.0*  < > 8.2* 7.9*  --  8.0* 7.7* 7.8* 7.7* 8.0* 7.9* 7.9*  PHOS 2.2*  < > 1.5* 2.0*  --  1.9* 2.0* 2.6 1.8* 3.4 1.6*  --   AST 33  --  33  --   --  33  --  25 29 24   --  27  ALT 21  --  19  --   --  19  --  15* 17 15*  --  16*  < > = values in this interval not displayed. Liver  Function Tests:  Recent Labs Lab 06/21/17 0215 06/22/17 0219 06/22/17 1649 06/23/17 0300  AST 29 24  --  27  ALT 17 15*  --  16*  ALKPHOS 124 115  --  124  BILITOT 1.0 0.7  --  0.6  PROT 6.2* 6.3*  --  6.3*  ALBUMIN 2.2* 2.1* 2.2* 2.2*   No results for input(s): LIPASE, AMYLASE in the last 168 hours. No results for input(s): AMMONIA in the last 168 hours. CBC:  Recent Labs Lab 06/17/17 0414  06/18/17 0315  06/19/17 0326  06/20/17 0351 06/21/17 0215 06/22/17 0219 06/22/17 1650 06/23/17 0300  WBC 14.7*  --  14.9*  --  14.1*  < > 16.0* 17.6* 16.0* 16.6* 16.1*  NEUTROABS 11.5*  --  10.3*  --  10.6*  --   --   --   --   --   --   HGB 8.6*  < > 8.8*  < > 8.6*  < > 8.1* 8.0* 7.7* 9.3* 8.9*  HCT 25.2*  < > 26.5*  < > 25.0*  < > 24.2* 24.3* 22.8* 28.1* 26.7*  MCV 76.4*  --  77.9*  --  79.4  < > 78.8 78.9 78.9 79.6 80.4  PLT 219  --  254  --  275  < > 321 336 354 391 406*  < > = values in this interval not displayed. Cardiac Enzymes: No results for input(s): CKTOTAL, CKMB, CKMBINDEX, TROPONINI in the last 168 hours. CBG:  Recent Labs Lab 06/22/17 1604 06/22/17 2009 06/22/17 2347 06/23/17 0426 06/23/17 0834  GLUCAP 98 199* 208* 110* 138*    Iron Studies: No results for input(s): IRON, TIBC, TRANSFERRIN, FERRITIN in the last 72 hours. Studies/Results: Dg Chest Port 1 View  Result Date: 06/23/2017 CLINICAL DATA:  Left chest soreness, LVAD EXAM: PORTABLE CHEST 1 VIEW COMPARISON:  06/22/2017 FINDINGS: Support devices including LVAD are stable. Cardiomegaly. Left lower lobe atelectasis or infiltrate. Cannot exclude left effusion. Minimal right base atelectasis. IMPRESSION: No significant change since prior study. Electronically Signed   By: Rolm Baptise M.D.   On: 06/23/2017 07:24   Dg Chest Port 1 View  Result Date: 06/22/2017 CLINICAL DATA:  LVAD.  Sore chest EXAM: PORTABLE CHEST 1 VIEW COMPARISON:  Two days ago FINDINGS: Right-sided central lines with tip at the upper  right atrium. Dual-chamber ICD/ pacer in stable position. An LVAD is present. Low volume chest with atelectatic opacities greater on the left. Probable small left effusion. No pneumothorax. IMPRESSION: 1. Stable from 2 days prior. 2. Low volume chest with left more than right atelectasis. Electronically Signed   By: Monte Fantasia M.D.   On: 06/22/2017 07:28   . amiodarone  400 mg Oral BID  . aspirin EC  325 mg Oral Daily   Or  . aspirin  324 mg Per Tube Daily   Or  . aspirin  300 mg Rectal Daily  . atorvastatin  80 mg Oral q1800  . bisacodyl  10 mg Oral Daily   Or  . bisacodyl  10 mg Rectal Daily  . Chlorhexidine Gluconate Cloth  6 each Topical Daily  . darbepoetin (ARANESP) injection - DIALYSIS  100 mcg Intravenous Q Sat-HD  . docusate sodium  200 mg Oral Daily  . feeding supplement (GLUCERNA SHAKE)  237 mL Oral TID BM  . feeding supplement (PRO-STAT SUGAR FREE 64)  30 mL Oral TID WC  . insulin aspart  0-24 Units Subcutaneous Q4H  . mupirocin ointment   Nasal BID  . pantoprazole  40 mg Oral Daily  . sildenafil  20 mg Oral TID  . sodium chloride flush  10-40 mL Intracatheter Q12H  . sodium chloride flush  3 mL Intravenous Q12H  . Warfarin - Pharmacist Dosing Inpatient   Does not apply q1800    BMET    Component Value Date/Time   NA 131 (L) 06/23/2017 0300   NA 144 05/11/2017 0938   K 4.0 06/23/2017 0300   CL 96 (L) 06/23/2017 0300   CO2 27 06/23/2017 0300   GLUCOSE 87 06/23/2017 0300   BUN 16 06/23/2017 0300   BUN 68 (H) 05/11/2017 0938   CREATININE  4.51 (H) 06/23/2017 0300   CREATININE 1.63 (H) 11/09/2016 1505   CALCIUM 7.9 (L) 06/23/2017 0300   GFRNONAA 14 (L) 06/23/2017 0300   GFRNONAA 48 (L) 11/09/2016 1505   GFRAA 16 (L) 06/23/2017 0300   GFRAA 55 (L) 11/09/2016 1505   CBC    Component Value Date/Time   WBC 16.1 (H) 06/23/2017 0300   RBC 3.32 (L) 06/23/2017 0300   HGB 8.9 (L) 06/23/2017 0300   HGB 11.7 (L) 09/07/2016 1457   HCT 26.7 (L) 06/23/2017 0300    HCT 37.5 09/07/2016 1457   PLT 406 (H) 06/23/2017 0300   PLT 279 09/07/2016 1457   MCV 80.4 06/23/2017 0300   MCV 81 09/07/2016 1457   MCH 26.8 06/23/2017 0300   MCHC 33.3 06/23/2017 0300   RDW 17.4 (H) 06/23/2017 0300   RDW 14.9 09/07/2016 1457   LYMPHSABS 1.6 06/19/2017 0326   LYMPHSABS 1.3 09/07/2016 1457   MONOABS 1.8 (H) 06/19/2017 0326   EOSABS 0.1 06/19/2017 0326   EOSABS 0.2 09/07/2016 1457   BASOSABS 0.0 06/19/2017 0326   BASOSABS 0.0 09/07/2016 1457     Assessment/Plan:  1. AKI/CKD oliguric/anuric in setting of cardiogenic shock and +/- vanc toxicity. Has tolerated CVVHD since 06/15/17 with marked improvement in volume (net negative 7 liters over a3 day period) and stopped 06/19/17. Unfortunately he has remained anuric  1. S/p first IHD 06/20/17 with some drop in BP and only negative 750 2. Did not respond to dose of lasix 141m IV x 1 to see if he will start producing urine now that BP's have improved 3. Plan for HD on TTS schedule for now and follow UOP. 2. Vascular access- will need tunneled HD catheter and AVF/AVG sincehe remains dialysis-dependent.  [facility]  Should probably remove temp HD cath after HD tomorrow since it has been in for 8 days and increases risk of infection. 3. 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  4. CAD s/p NSTEMI 3/18. Off plavix due to LVAD 5. H/o DVT's on coumadin  6. DM- per primary svc 7. ABLA/Anemia of CKD- follow H/H, on Aranesp. Transfuse prn 8. A fib- per Cardiology, on amiodarone.  JDonetta Potts MD [phone]

## 2017-06-23 NOTE — Progress Notes (Signed)
Upper Extremity Vein Map    Cephalic  Segment Diameter Depth Comment  1. Axilla 2.38m 6.244m  2. Mid upper arm mm mm   3. Above AC 1.2851m.71m47m4. In AC 2[facility]3mm5m9mm 42mch  5. Below AC 1.14mm 473mm   71mid forearm 1.78mm mm 61m Wrist mm mm    mm mm    mm mm    mm mm    Basilic  Segment Diameter Depth Comment  1. Axilla mm mm   2. Mid upper arm 1.78mm 13mm66m. 38me AC 2.42mm 10.5mm35m. In 62m1.71mm 7.2[facility]m  3mBelo54m mm mm   6. Mid forearm mm mm   7. Wrist mm mm    mm mm    mm mm    mm mm    Cephalic  Segment Diameter Depth Comment  1. Axilla 2.02mm 12.2mm   237md upp37marm 1.64mm 5.96mm   3. 49me AC47m4mm mm   4. In AC 85mmm mm   5. Below A35m14mm mm   6. Mid fore88mmm mm   7. Wrist mm mm    mm mm    mm mm    mm mm    Basilic  Segment Diameter Depth Comment  1. Axilla 3.42mm 11.7mm   2. Mid u49m arm 66m4mm 11.1mm   3. Above A41m89mm 74m6mm   4. In AC 2.639m.41m45m5. Below AC 1.961m mm 78m Mid forearm mm mm37m. Wrist mm mm    mm mm    mm mm    mm mm    Ersie Savino Eunice, RDMS, RVT 10/19/20Landry Mellow

## 2017-06-23 NOTE — Progress Notes (Signed)
Patient ID: [name], male   DOB: 1965-09-08, 51 y.o.   MRN: [id number]   Advanced Heart Failure VAD Team Note  Subjective:    Admitted 10/3 for RHC/Swan implantation and optimization prior to LVAD.  HeartMate 3 VAD implanted 10/8.  CVVH begun 10/11 CVVH stopped 10/15 Started iHD 10/17 (now TTS)  Hgb 8.9.  LDH 286 => 284 INR 1.9 Co-ox 59%  Remains on milrinone 0.125 mcg. Still no sign of renal recovery. Walked around the nursing unit without difficulty x 2 yesterday.  Has occasional nausea (h/o gastroparesis).    LVAD INTERROGATION:  HeartMate 3:  Flow 4 liters/min, speed 5400 rpm, power 4, PI 4.1.   No PI events.    Objective:    Vital Signs:   Temp:  [97.6 F (36.4 C)-98.5 F (36.9 C)] 97.9 F (36.6 C) (10/19 0400) Pulse Rate:  [50-110] 100 (10/19 0700) Resp:  [7-39] 39 (10/19 0700) BP: (89-134)/(52-94) 97/86 (10/19 0700) SpO2:  [89 %-98 %] 89 % (10/19 0700) Weight:  [207 lb (93.9 kg)-213 lb 3 oz (96.7 kg)] 207 lb (93.9 kg) (10/19 0400) Last BM Date: 06/22/17 Mean arterial Pressure variable, 70s-90s   Intake/Output:   Intake/Output Summary (Last 24 hours) at 06/23/17 0750 Last data filed at 06/23/17 0749  Gross per 24 hour  Intake          1929.82 ml  Output             1500 ml  Net           429.82 ml     Physical Exam: GENERAL: Well appearing this am. NAD.  HEENT: Normal. NECK: Supple, JVP 8 cm. Right IJ dialysis catheter.  CARDIAC:  Mechanical heart sounds with LVAD hum present.  LUNGS:  CTAB, normal effort.  ABDOMEN:  NT, ND, no HSM. No bruits or masses. +BS  LVAD exit site: Well-healed and incorporated. Dressing dry and intact. No erythema or drainage. Stabilization device present and accurately applied. Driveline dressing changed daily per sterile technique. EXTREMITIES:  Warm and dry. No cyanosis, clubbing, rash, or edema.  NEUROLOGIC:  Alert & oriented x 3. Cranial nerves grossly intact. Moves all 4 extremities w/o difficulty. Affect pleasant        Telemetry   NSR around 100, had brief atrial fibrillation overnight (personally reviewed).    Labs   Basic Metabolic Panel:  Recent Labs Lab 06/19/17 0326 06/19/17 1207 06/20/17 0351 06/21/17 0215 06/22/17 0219 06/22/17 1649 06/23/17 0300  NA 135 131* 129* 127* 127* 134* 131*  K 4.3 4.1 3.7 3.9 4.0 4.0 4.0  CL 99* 97* 93* 91* 92* 97* 96*  CO2 23 25 23 23 24 28 27   GLUCOSE 125* 365* 144* 148* 174* 102* 87  BUN 10 15 20 17  29* 9 16  CREATININE 2.52* 2.93* 4.52* 4.04* 6.17* 3.16* 4.51*  CALCIUM 8.0* 7.7* 7.8* 7.7* 8.0* 7.9* 7.9*  MG 2.4  --  2.2 1.9 2.0  --  1.9  PHOS 1.9* 2.0* 2.6 1.8* 3.4 1.6*  --     Liver Function Tests:  Recent Labs Lab 06/19/17 0326  06/20/17 0351 06/21/17 0215 06/22/17 0219 06/22/17 1649 06/23/17 0300  AST 33  --  25 29 24   --  27  ALT 19  --  15* 17 15*  --  16*  ALKPHOS 110  --  113 124 115  --  124  BILITOT 1.5*  --  1.0 1.0 0.7  --  0.6  PROT 6.5  --  6.2* 6.2* 6.3*  --  6.3*  ALBUMIN 2.5*  < > 2.2* 2.2* 2.1* 2.2* 2.2*  < > = values in this interval not displayed. No results for input(s): LIPASE, AMYLASE in the last 168 hours. No results for input(s): AMMONIA in the last 168 hours.  CBC:  Recent Labs Lab 06/17/17 0414  06/18/17 0315  06/19/17 0326  06/20/17 0351 06/21/17 0215 06/22/17 0219 06/22/17 1650 06/23/17 0300  WBC 14.7*  --  14.9*  --  14.1*  < > 16.0* 17.6* 16.0* 16.6* 16.1*  NEUTROABS 11.5*  --  10.3*  --  10.6*  --   --   --   --   --   --   HGB 8.6*  < > 8.8*  < > 8.6*  < > 8.1* 8.0* 7.7* 9.3* 8.9*  HCT 25.2*  < > 26.5*  < > 25.0*  < > 24.2* 24.3* 22.8* 28.1* 26.7*  MCV 76.4*  --  77.9*  --  79.4  < > 78.8 78.9 78.9 79.6 80.4  PLT 219  --  254  --  275  < > 321 336 354 391 406*  < > = values in this interval not displayed.  INR:  Recent Labs Lab 06/19/17 0326 06/20/17 0351 06/21/17 0215 06/22/17 0219 06/23/17 0300  INR 2.35 2.41 2.13 1.72 1.89    Other results:  EKG:    Imaging   Dg  Chest Port 1 View  Result Date: 06/23/2017 CLINICAL DATA:  Left chest soreness, LVAD EXAM: PORTABLE CHEST 1 VIEW COMPARISON:  06/22/2017 FINDINGS: Support devices including LVAD are stable. Cardiomegaly. Left lower lobe atelectasis or infiltrate. Cannot exclude left effusion. Minimal right base atelectasis. IMPRESSION: No significant change since prior study. Electronically Signed   By: Rolm Baptise M.D.   On: 06/23/2017 07:24   Dg Chest Port 1 View  Result Date: 06/22/2017 CLINICAL DATA:  LVAD.  Sore chest EXAM: PORTABLE CHEST 1 VIEW COMPARISON:  Two days ago FINDINGS: Right-sided central lines with tip at the upper right atrium. Dual-chamber ICD/ pacer in stable position. An LVAD is present. Low volume chest with atelectatic opacities greater on the left. Probable small left effusion. No pneumothorax. IMPRESSION: 1. Stable from 2 days prior. 2. Low volume chest with left more than right atelectasis. Electronically Signed   By: Monte Fantasia M.D.   On: 06/22/2017 07:28     Medications:     Scheduled Medications: . amiodarone  400 mg Oral BID  . aspirin EC  325 mg Oral Daily   Or  . aspirin  324 mg Per Tube Daily   Or  . aspirin  300 mg Rectal Daily  . atorvastatin  80 mg Oral q1800  . bisacodyl  10 mg Oral Daily   Or  . bisacodyl  10 mg Rectal Daily  . Chlorhexidine Gluconate Cloth  6 each Topical Daily  . darbepoetin (ARANESP) injection - DIALYSIS  100 mcg Intravenous Q Sat-HD  . docusate sodium  200 mg Oral Daily  . feeding supplement (GLUCERNA SHAKE)  237 mL Oral TID BM  . feeding supplement (PRO-STAT SUGAR FREE 64)  30 mL Oral TID WC  . insulin aspart  0-24 Units Subcutaneous Q4H  . mupirocin ointment   Nasal BID  . pantoprazole  40 mg Oral Daily  . sildenafil  20 mg Oral TID  . sodium chloride flush  10-40 mL Intracatheter Q12H  . sodium chloride flush  3 mL Intravenous Q12H  . Warfarin -  Pharmacist Dosing Inpatient   Does not apply q1800    Infusions: . heparin 1,000  Units/hr (06/23/17 0700)  . lactated ringers Stopped (06/14/17 1400)  . lactated ringers Stopped (06/15/17 1901)    PRN Medications: heparin, hydrALAZINE, LORazepam, ondansetron (ZOFRAN) IV, oxyCODONE, promethazine, sodium chloride flush, sodium chloride flush, traMADol   Patient Profile   51 yo with history of CAD s/p anterior MI in 2010 and chronic systolic CHF (ischemic cardiomyopathy) as well as CKD stage III now s/p HeartMate 3 VAD placement.   Assessment/Plan:    1. Acute on chronic systolic CHF: Ischemic cardiomyopathy. [employer] ICD. NSTEMI in March 2018 with DES to LAD and RCA, complicated by cardiogenic shock, low output requiring milrinone. Most recent echo 8/18 with EF 20-25%, moderate MR. He was admitted and started on milrinone with improved creatinine.  HeartMate 3 VAD placement on 10/8. Speed cut back to 5400 with onset of afib/RVR 10/11.   Continues on milrinone 0.125 mcg/kg/min. Co-ox 59%.  - Stop milrione today, continue sildenafil for RV support.   - INR 1.9, INR Goal 2-3 (aim slightly higher with h/o Factor V Leiden heterozygosity) and ASA 325. Will not lower aspirin dose with Factor V Leiden. Would hold warfarin for now and cover with heparin pending permanent dialysis access placement on Monday (Dr. Bridgett Larsson).   - MAP fairly stable, no changes.  - Will need eventual ramp echo before discharge (aim for Monday or Tuesday). 2. AKI on CKD: Still no renal recovery.  Minimal UOP. Has had HD x 2, now on TTS schedule. Needs permanent access on Monday.  3. CAD: NSTEMI in 3/18. LHC with 99% ulcerated lesion proximal RCA with left to right collaterals, 95% mid LAD stenosis after mid LAD stent. s/p PCI to RCA and LAD on 11/25/16.  - Off plavix for LVAD.   - Continue statin.   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 Recent V/Q scan did not show acute or chronic PE.  He is a heterozygote for Factor V Leiden, plan for for ASA 325 and warfarin INR 2-3 with LVAD.  5. Chronic  N/V/D with gastroparesis: Tolerating diet.  Has occasional nausea.    6. DM: On Jardiance at home, will cover with sliding scale. Glucose stable.   7. S/P Multiple Tooth Extractions 4-15, 19-23, and 27-29.  8. Atrial fibrillation: Paroxysmal. In NSR.  Continue amio 400 mg twice a day.      9. ID: CXR with LLL atelectasis versus infiltrate. Afebrile. WBC stable at 16.  - He is off vancomycin, think we can stop Zosyn today.    10. Hyponatremia: Sodium 131 stable.  11. Anemia: Hgb 8.9 today.  12. Continue to walk in halls, doing well with this.   Suspect home sometime mid week next week.      I reviewed the LVAD parameters from today, and compared the results to the patient's prior recorded data.  No programming changes were made.  The LVAD is functioning within specified parameters.  The patient performs LVAD self-test daily.  LVAD interrogation was negative for any significant power changes, alarms or PI events/speed drops.  LVAD equipment check completed and is in good working order.  Back-up equipment present.   LVAD education done on emergency procedures and precautions and reviewed exit site care.  Rudolfo Brandow 06/23/2017 7:50 AM

## 2017-06-23 NOTE — Progress Notes (Signed)
Patient ID: [name], male   DOB: 05-30-1966, 51 y.o.   MRN: [id number]  TCTS evening rounds:  Hemodynamically stable today Off milrinone and Co-ox unchanged at 60.  VAD parameters look good.  Ambulating  Had two BM's

## 2017-06-23 NOTE — Progress Notes (Signed)
Rt subclavian TLC D/C'd per MD orders. Pressure held at site until hemostasis was noted. Site dressed with petroleum gauze and 4x4. Catheter removed without difficulty and intact. Pt tolerated procedure without difficulties.

## 2017-06-23 NOTE — Progress Notes (Signed)
Physical Therapy Treatment Patient Details Name: [name] MRN: [id number] DOB: 18-Nov-1965 Today's Date: 06/23/2017    History of Present Illness 51 yo with history of CAD s/p anterior MI in 2010 and chronic systolic CHF (ischemic cardiomyopathy) as well as CKD stage III now s/p HeartMate 3 VAD placement.  PMH includes:  ASCVD,  CHF, Chron's disease, DVT, gastroparesis; HTN, ischemic cardiomyopathy DM; s/p cardiac debriblator placement.     PT Comments    Pt admitted with above diagnosis. Pt currently with functional limitations due to balance and endurance deficits. Pt was able to ambulate with EVa walker increasing distance and progressing so well.  PErforms all aspects of LVAD equipment without cues.   Pt will benefit from skilled PT to increase their independence and safety with mobility to allow discharge to the venue listed below.     Follow Up Recommendations  Home health PT;Supervision/Assistance - 24 hour     Equipment Recommendations  Other (comment) (rollator)    Recommendations for Other Services       Precautions / Restrictions Precautions Precautions: Fall;Sternal Precaution Comments: LVAD Restrictions Weight Bearing Restrictions: Yes (Sternal Precautions)    Mobility  Bed Mobility Overal bed mobility: Needs Assistance Bed Mobility: Rolling;Sidelying to Sit;Sit to Sidelying Rolling: Min guard Sidelying to sit: Min guard       General bed mobility comments: Pt did not need assist to come to EOB.  Pt was able to roll and push up on elbow holding his pillow.   Transfers Overall transfer level: Needs assistance Equipment used:  (EVa walker) Transfers: Sit to/from Stand Sit to Stand: Min guard Stand pivot transfers: Min guard       General transfer comment: Pt requires min guard assist to move into standing.  Pt aware to not use hands with pt holding arms across chest.    Ambulation/Gait Ambulation/Gait assistance: Min guard Ambulation Distance  (Feet): 580 Feet Assistive device:  (Eva walker) Gait Pattern/deviations: Step-through pattern;Wide base of support;Decreased stride length Gait velocity: slow Gait velocity interpretation: Below normal speed for age/gender General Gait Details: Pt ambulated well with Harmon Pier walker.  VSS.  Pt without LOB and very controlled with ambulation.     Stairs            Wheelchair Mobility    Modified Rankin (Stroke Patients Only)       Balance Overall balance assessment: Needs assistance Sitting-balance support: No upper extremity supported;Feet supported Sitting balance-[name]: Good Sitting balance - Comments: Able to sit EOB and perform LVAD equipment.    Standing balance support: Bilateral upper extremity supported Standing balance-[name]: Poor Standing balance comment: Requires Bil UE support to stand but light support only                High Level Balance Comments: min guard assist with above            Cognition Arousal/Alertness: Awake/alert Behavior During Therapy: WFL for tasks assessed/performed Overall Cognitive Status: Within Functional Limits for tasks assessed Area of Impairment: Problem solving;Safety/judgement;Following commands                       Following Commands: Follows one step commands with increased time Safety/Judgement: Decreased awareness of safety   Problem Solving: Slow processing;Decreased initiation;Difficulty sequencing;Requires verbal cues;Requires tactile cues        Exercises General Exercises - Lower Extremity Long Arc Quad: AROM;Both;Seated;10 reps    General Comments General comments (skin integrity, edema, etc.): Pt was  able to verbalize all components of switching LVAD equipment from wall power to batteries and back to wall power.  Can verbalize and perform all aspects and did not need any assist or cues.  Remembers back up bag as well.        Pertinent Vitals/Pain Pain Assessment: No/denies pain     Home Living                      Prior Function            PT Goals (current goals can now be found in the care plan section) Acute Rehab PT Goals Patient Stated Goal: to go home Progress towards PT goals: Progressing toward goals    Frequency    Min 3X/week      PT Plan Current plan remains appropriate    Co-evaluation              AM-PAC PT "6 Clicks" Daily Activity  Outcome Measure  Difficulty turning over in bed (including adjusting bedclothes, sheets and blankets)?: None Difficulty moving from lying on back to sitting on the side of the bed? : None Difficulty sitting down on and standing up from a chair with arms (e.g., wheelchair, bedside commode, etc,.)?: A Little Help needed moving to and from a bed to chair (including a wheelchair)?: A Little Help needed walking in hospital room?: A Little Help needed climbing 3-5 steps with a railing? : Total 6 Click Score: 18    End of Session Equipment Utilized During Treatment: Oxygen;Gait belt Activity Tolerance: Patient tolerated treatment well Patient left: in chair;with call bell/phone within reach Nurse Communication: Mobility status PT Visit Diagnosis: Unsteadiness on feet (R26.81);Muscle weakness (generalized) (M62.81)     Time: 3419-6222 PT Time Calculation (min) (ACUTE ONLY): 31 min  Charges:  $Gait Training: 8-22 mins $Therapeutic Exercise: 23-37 mins                    G Codes:       Xavious Sharrar,PT Acute Rehabilitation [phone] (pager)    Denice Paradise 06/23/2017, 12:59 PM

## 2017-06-23 NOTE — Progress Notes (Signed)
[facility] for Coumadin/Heparin Indication: LVAD, h/o DVT Factor V Leiden heterozygosity  Allergies  Allergen Reactions  . Metformin And Related Diarrhea    Patient Measurements: Height: 5' 8"  (172.7 cm) Weight: 207 lb (93.9 kg) (Pt weighed with vest, controller, and portable batteries) IBW/kg (Calculated) : 68.4   Vital Signs: Temp: 98.5 F (36.9 C) (10/19 0800) Temp Source: Oral (10/19 0800) BP: 112/84 (10/19 0800) Pulse Rate: 100 (10/19 0700)  Labs:  Recent Labs  06/21/17 0215 06/22/17 0219 06/22/17 1649 06/22/17 1650 06/22/17 1651 06/23/17 0300 06/23/17 0848  HGB 8.0* 7.7*  --  9.3*  --  8.9*  --   HCT 24.3* 22.8*  --  28.1*  --  26.7*  --   PLT 336 354  --  391  --  406*  --   LABPROT 23.6* 20.0*  --   --   --  21.5*  --   INR 2.13 1.72  --   --   --  1.89  --   HEPARINUNFRC  --   --   --   --  0.20* 0.31 0.30  CREATININE 4.04* 6.17* 3.16*  --   --  4.51*  --     Estimated Creatinine Clearance: 21.3 mL/min (A) (by C-G formula based on SCr of 4.51 mg/dL (H)).   Medical History: Past Medical History:  Diagnosis Date  . Angina   . ASCVD (arteriosclerotic cardiovascular disease)    , Anterior infarction 2005, LAD diagonal bifurcation intervention 03/2004  . Automatic implantable cardiac defibrillator -St. Jude's       . Benign neoplasm of colon   . CHF (congestive heart failure) ([place])   . Chronic systolic heart failure ([place])   . Coronary artery disease     Widely patent previously placed stents in the left anterior   . Crohn's disease ([place])   . 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 Dyspnea   . Gastroparesis   . GERD (gastroesophageal reflux disease)   . High cholesterol   . Hyperlipidemia   . Hypersomnolent    Previous diagnosis of narcolepsy  . Hypertension, essential   . Ischemic cardiomyopathy    Ejection fraction 15-20% catheterization 2010  . Type II or unspecified type diabetes  mellitus without mention of complication, not stated as uncontrolled   . Unspecified gastritis and gastroduodenitis without mention of hemorrhage     Assessment: 49 yoM on chronic Coumadin for Factor 5 Leiden and hx DVTs admitted for LVAD placement. Heparin started yesterday with subtherapeutic INR, now planning to hold warfarin over the weekend and cover with heparin as pt will need vascular access placed Monday for HD. INR remains below target at 1.89 , heparin level therapeutic at 0.30, LDH/CBC stable.  Goal of Therapy:  INR 2-3  Heparin Level 0.3-0.7 Monitor platelets by anticoagulation protocol: Yes   Plan:  -Hold Coumadin for now -Increase heparin slightly to 1050 units/hr -Recheck heparin level,CBC, INR in the morning  Arrie Senate, PharmD PGY-2 Cardiology Pharmacy Resident Pager: [phone] 06/23/2017

## 2017-06-23 NOTE — Progress Notes (Signed)
LVAD Coordinator Rounding Note:  Admitted 06/07/2017 for hemodynamic optimization prior to LVAD placement. Full dental extractions prior to LVAD implant.  HM3LVAD implated on 10/8/2018by Dr Craige Cotta Destination Therapycriteria due to renal insufficiency.  Vital signs: Temp: 98.5 HR: 99 - sinus tach Doppler:  86 Auto cuff: 112/84 (93) O2 Sat: 91% on RA  Wt:194>217>220>220>226>213>207>199>198 >198>199>201>207lbs   LVAD interrogation reveals:   Speed: 5400 Flow:  3.7 Power: 4.0w PI: 5.1 Alarms: none Events: none Hematocrit: 26 Fixed speed: 5400 Low speed limit: 5100   Drive Line: Daily. Change daily per protocol using daily kits and silver strip.  VAD Coordinator will change with daughter today.   Labs:  LDH trend:255>343>379>376>366>314>304>286>284  INR trend: 1.44>1.23>1.44>1.47>1.93>2.41>2.13>1.72>1.89  Hgb trend: 9.6>8.8>8.1>8.6>8.8>8.6>8.1>8.0>7.7>8.9  Anticoagulation Plan: -INR Goal: 2-3, warfarin restarted 10/9  -ASA Dose: 38m (will remain on this with factor V heterozygosity) -Heparin level 0.3 - 0.7  Blood Products:  - 2 units FFP in OR - 1 unit PC in OR - 1 unit PRBC's 10/12 - 1 unit PRBC's 10/18  NO: off 06/19/17  Gtts: - Milrinone 0.25 mcg/mg/min - wean to 0.125 today off 10/18 - Heparin 1000u/hr  Device: -St Jude  -Therapies: off. Perm pacer set at 60 bpm. -Temp pacer set AAI at 88 bpm, MA-5  Arrythmias: Atrial fib started 10/12. Amiodarone started 06/12/17 initially for ventricular arrhythmia   Respiratory: extubated 10/9   Renal:  Creat trend:  1.62>2.75>4.04>3.71>>2.56>2.44>2.52>4.52>4.4>6.17 - Oliguric renal failure with rising CVP - Trialysis cath placed 06/16/17 and CVVHD started goal 100 cc/hr removal for increased wt, CVP - Stop CVVHD 06/19/17 and plan for intermittent hemodialysis while pt oliguric -  06/20/17 - HD -  06/22/17 - HD later today  Adverse Events on VAD: -Oliguric renal failure  post implant with initiation of CVVHD and HD  Patient Education: Patient switching from batteries to power module without difficulty. Pt performed self test this am on his controller without difficulty. Walked in hallway x 2 this am and still is having problems with holster vest; wearing controller around neck. Will try Holster vest next walk.  Plan to come up and demonstrated dressing change to daughter this afternoon.  Pt does c/o of decreased appetite and bouts of nausea. He is drinking his Glucerna shakes x 3 daily. Informed him to try and eat smaller, more frequent meals until appetite returns, and stressed importance of protein intake to promote wound healing. Pt verbalized understanding of same.   Plan/Recommendations:  1. Continue daily dressing changes with family education - VAD coordinator will perform later this afternoon with daughter. 2. Asked patient and caregivers to complete reading HM III Patient Manual prior to discharge teaching session 3. Call VAD Coordinator if any questions re: VAD or VAD equipment.   STanda RockersRN, VAD Coordinator 24/7 pager [phone]

## 2017-06-23 NOTE — Progress Notes (Signed)
Existing VAD dressing removed and site care performed using sterile technique. Drive line exit site cleaned with Chlora prep applicators x 2, allowed to dry, and gauze dressing with silver strip re-applied. Exit site healing and unincorporated, distal and proximal suture intact, the velour is fully implanted at exit site. No redness, tenderness, or foul odor or rash noted. Small amount of bloody drainage. Did not remove distal suture today due to the new bloody drainage. Pt educated about importance of anchor and educated on the importance of not letting anything pull or tug on the driveline.   [name] RN, BSN VAD Coordinator 24/7 Pager [phone]

## 2017-06-23 NOTE — Progress Notes (Signed)
[facility] for Coumadin/Heparin Indication: LVAD, h/o DVT Factor V Leiden heterozygosity  Allergies  Allergen Reactions  . Metformin And Related Diarrhea    Patient Measurements: Height: 5' 8"  (172.7 cm) Weight: 209 lb 14.1 oz (95.2 kg) IBW/kg (Calculated) : 68.4   Vital Signs: Temp: 98.5 F (36.9 C) (10/19 0000) Temp Source: Oral (10/19 0000) BP: 98/81 (10/19 0300) Pulse Rate: 99 (10/19 0300)  Labs:  Recent Labs  06/21/17 0215 06/22/17 0219 06/22/17 1649 06/22/17 1650 06/22/17 1651 06/23/17 0300  HGB 8.0* 7.7*  --  9.3*  --  8.9*  HCT 24.3* 22.8*  --  28.1*  --  26.7*  PLT 336 354  --  391  --  406*  LABPROT 23.6* 20.0*  --   --   --  21.5*  INR 2.13 1.72  --   --   --  1.89  HEPARINUNFRC  --   --   --   --  0.20* 0.31  CREATININE 4.04* 6.17* 3.16*  --   --  4.51*    Estimated Creatinine Clearance: 21.4 mL/min (A) (by C-G formula based on SCr of 4.51 mg/dL (H)).   Medical History: Past Medical History:  Diagnosis Date  . Angina   . ASCVD (arteriosclerotic cardiovascular disease)    , Anterior infarction 2005, LAD diagonal bifurcation intervention 03/2004  . Automatic implantable cardiac defibrillator -St. Jude's       . Benign neoplasm of colon   . CHF (congestive heart failure) ([place])   . Chronic systolic heart failure ([place])   . Coronary artery disease     Widely patent previously placed stents in the left anterior   . Crohn's disease ([place])   . 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 Dyspnea   . Gastroparesis   . GERD (gastroesophageal reflux disease)   . High cholesterol   . Hyperlipidemia   . Hypersomnolent    Previous diagnosis of narcolepsy  . Hypertension, essential   . Ischemic cardiomyopathy    Ejection fraction 15-20% catheterization 2010  . Type II or unspecified type diabetes mellitus without mention of complication, not stated as uncontrolled   . Unspecified gastritis and  gastroduodenitis without mention of hemorrhage     Assessment: 36 yoM on chronic Coumadin for Factor 5 Leiden and hx DVTs admitted for LVAD placement. Pharmacy to dose heparin while INR < 2.  Heparin level therapeutic at 0.31. CBC low-stable and no s/s bleeding documented.   Goal of Therapy:  INR 2-3  Heparin Level 0.3-0.7 Monitor platelets by anticoagulation protocol: Yes   Plan:  Continue heparin gtt at 1000 units/hr Confirm heparin level in 6 hrs Daily heparin level, INR, and CBC Monitor for s/s bleeding  Lavonda Jumbo, PharmD Clinical Pharmacist 06/23/17 3:56 AM

## 2017-06-24 ENCOUNTER — Inpatient Hospital Stay (HOSPITAL_COMMUNITY): Payer: BLUE CROSS/BLUE SHIELD

## 2017-06-24 DIAGNOSIS — I480 Paroxysmal atrial fibrillation: Secondary | ICD-10-CM

## 2017-06-24 DIAGNOSIS — Z954 Presence of other heart-valve replacement: Secondary | ICD-10-CM

## 2017-06-24 DIAGNOSIS — I5043 Acute on chronic combined systolic (congestive) and diastolic (congestive) heart failure: Secondary | ICD-10-CM

## 2017-06-24 LAB — MAGNESIUM: MAGNESIUM: 2.1 mg/dL (ref 1.7–2.4)

## 2017-06-24 LAB — COMPREHENSIVE METABOLIC PANEL
ALT: 16 U/L — ABNORMAL LOW (ref 17–63)
AST: 24 U/L (ref 15–41)
Albumin: 2.3 g/dL — ABNORMAL LOW (ref 3.5–5.0)
Alkaline Phosphatase: 136 U/L — ABNORMAL HIGH (ref 38–126)
Anion gap: 12 (ref 5–15)
BUN: 33 mg/dL — ABNORMAL HIGH (ref 6–20)
CO2: 24 mmol/L (ref 22–32)
Calcium: 8.2 mg/dL — ABNORMAL LOW (ref 8.9–10.3)
Chloride: 92 mmol/L — ABNORMAL LOW (ref 101–111)
Creatinine, Ser: 6.63 mg/dL — ABNORMAL HIGH (ref 0.61–1.24)
GFR calc Af Amer: 10 mL/min — ABNORMAL LOW (ref >60)
GFR calc non Af Amer: 9 mL/min — ABNORMAL LOW (ref >60)
Glucose, Bld: 185 mg/dL — ABNORMAL HIGH (ref 65–99)
Potassium: 4.1 mmol/L (ref 3.5–5.1)
Sodium: 128 mmol/L — ABNORMAL LOW (ref 135–145)
Total Bilirubin: 0.8 mg/dL (ref 0.3–1.2)
Total Protein: 7 g/dL (ref 6.5–8.1)

## 2017-06-24 LAB — GLUCOSE, CAPILLARY
GLUCOSE-CAPILLARY: 177 mg/dL — AB (ref 65–99)
GLUCOSE-CAPILLARY: 186 mg/dL — AB (ref 65–99)
GLUCOSE-CAPILLARY: 242 mg/dL — AB (ref 65–99)
Glucose-Capillary: 158 mg/dL — ABNORMAL HIGH (ref 65–99)
Glucose-Capillary: 188 mg/dL — ABNORMAL HIGH (ref 65–99)
Glucose-Capillary: 154 mg/dL — ABNORMAL HIGH (ref 65–99)

## 2017-06-24 LAB — CBC
HCT: 28 % — ABNORMAL LOW (ref 39.0–52.0)
Hemoglobin: 9.3 g/dL — ABNORMAL LOW (ref 13.0–17.0)
MCH: 26.8 pg (ref 26.0–34.0)
MCHC: 33.2 g/dL (ref 30.0–36.0)
MCV: 80.7 fL (ref 78.0–100.0)
Platelets: 446 10*3/uL — ABNORMAL HIGH (ref 150–400)
RBC: 3.47 MIL/uL — ABNORMAL LOW (ref 4.22–5.81)
RDW: 17.5 % — ABNORMAL HIGH (ref 11.5–15.5)
WBC: 16.8 10*3/uL — ABNORMAL HIGH (ref 4.0–10.5)

## 2017-06-24 LAB — COOXEMETRY PANEL
Carboxyhemoglobin: 1.7 % — ABNORMAL HIGH (ref 0.5–1.5)
Methemoglobin: 0.8 % (ref 0.0–1.5)
O2 Saturation: 59.6 %
Total hemoglobin: 8.9 g/dL — ABNORMAL LOW (ref 12.0–16.0)

## 2017-06-24 LAB — PROTIME-INR
INR: 1.69
Prothrombin Time: 19.7 seconds — ABNORMAL HIGH (ref 11.4–15.2)

## 2017-06-24 LAB — HEPARIN LEVEL (UNFRACTIONATED): HEPARIN UNFRACTIONATED: 0.36 [IU]/mL (ref 0.30–0.70)

## 2017-06-24 LAB — LACTATE DEHYDROGENASE: LDH: 295 U/L — AB (ref 98–192)

## 2017-06-24 MED ORDER — HEPARIN SODIUM (PORCINE) 1000 UNIT/ML DIALYSIS: 20.0000 [IU]/kg | INTRAMUSCULAR | Status: DC | PRN

## 2017-06-24 MED ORDER — DARBEPOETIN ALFA 100 MCG/0.5ML IJ SOSY
PREFILLED_SYRINGE | INTRAMUSCULAR | Status: AC
  Filled 2017-06-24: qty 0.5

## 2017-06-24 MED ORDER — FUROSEMIDE 10 MG/ML IJ SOLN
160.0000 mg | Freq: Once | INTRAVENOUS | Status: AC
  Administered 2017-06-24: 160 mg via INTRAVENOUS
  Filled 2017-06-24: qty 10

## 2017-06-24 NOTE — Progress Notes (Signed)
Patient ID: [name], male   DOB: 11-29-1965, 51 y.o.   MRN: [id number]   Advanced Heart Failure VAD Team Note  Subjective:    Admitted 10/3 for RHC/Swan implantation and optimization prior to LVAD.  HeartMate 3 VAD implanted 10/8.  CVVH begun 10/11 CVVH stopped 10/15 Started iHD 10/17 (now TTS)  Hgb 8.9.  LDH 286 => 284 INR 1.9 Co-ox 59%  Milrinone stopped yesterday. Co-ox 60%. Walking halls without too much difficulty. Remains anuric.   LVAD INTERROGATION:  HeartMate 3:  Flow 4.3 liters/min, speed 5400 rpm, power 4, PI 3.3 .   No PI events.    Objective:    Vital Signs:   Temp:  [97.1 F (36.2 C)-98.8 F (37.1 C)] 97.8 F (36.6 C) (10/20 0802) Pulse Rate:  [55-97] 89 (10/20 0600) Resp:  [5-30] 30 (10/20 0600) BP: (89-115)/(72-91) 96/72 (10/20 0600) SpO2:  [92 %-99 %] 95 % (10/20 0600) Weight:  [93.8 kg (206 lb 12.7 oz)] 93.8 kg (206 lb 12.7 oz) (10/20 0354) Last BM Date: 06/24/17 Mean arterial Pressure variable, 80s-90s   Intake/Output:   Intake/Output Summary (Last 24 hours) at 06/24/17 1020 Last data filed at 06/24/17 0928  Gross per 24 hour  Intake          1228.39 ml  Output                1 ml  Net          1227.39 ml     Physical Exam: General:  NAD.  HEENT: normal  Neck: supple. JVP not elevated.  Carotids 2+ bilat; no bruits. No lymphadenopathy or thryomegaly appreciated. Cor: LVAD hum.  Lungs: Clear. Abdomen: obese soft, nontender, non-distended. No hepatosplenomegaly. No bruits or masses. Good bowel sounds. Driveline site clean. Anchor in place.  Extremities: no cyanosis, clubbing, rash. Warm no edema  Neuro: alert & oriented x 3. No focal deficits. Moves all 4 without problem        Telemetry   NSR around 100, had brief atrial fibrillation overnight (personally reviewed).    Labs   Basic Metabolic Panel:  Recent Labs Lab 06/19/17 1207 06/20/17 0351 06/21/17 0215 06/22/17 0219 06/22/17 1649 06/23/17 0300 06/24/17 0316  NA  131* 129* 127* 127* 134* 131* 128*  K 4.1 3.7 3.9 4.0 4.0 4.0 4.1  CL 97* 93* 91* 92* 97* 96* 92*  CO2 25 23 23 24 28 27 24   GLUCOSE 365* 144* 148* 174* 102* 87 185*  BUN 15 20 17  29* 9 16 33*  CREATININE 2.93* 4.52* 4.04* 6.17* 3.16* 4.51* 6.63*  CALCIUM 7.7* 7.8* 7.7* 8.0* 7.9* 7.9* 8.2*  MG  --  2.2 1.9 2.0  --  1.9 2.1  PHOS 2.0* 2.6 1.8* 3.4 1.6*  --   --     Liver Function Tests:  Recent Labs Lab 06/20/17 0351 06/21/17 0215 06/22/17 0219 06/22/17 1649 06/23/17 0300 06/24/17 0316  AST 25 29 24   --  27 24  ALT 15* 17 15*  --  16* 16*  ALKPHOS 113 124 115  --  124 136*  BILITOT 1.0 1.0 0.7  --  0.6 0.8  PROT 6.2* 6.2* 6.3*  --  6.3* 7.0  ALBUMIN 2.2* 2.2* 2.1* 2.2* 2.2* 2.3*   No results for input(s): LIPASE, AMYLASE in the last 168 hours. No results for input(s): AMMONIA in the last 168 hours.  CBC:  Recent Labs Lab 06/18/17 0315  06/19/17 0326  06/21/17 0215 06/22/17 0219 06/22/17 1650  06/23/17 0300 06/24/17 0316  WBC 14.9*  --  14.1*  < > 17.6* 16.0* 16.6* 16.1* 16.8*  NEUTROABS 10.3*  --  10.6*  --   --   --   --   --   --   HGB 8.8*  < > 8.6*  < > 8.0* 7.7* 9.3* 8.9* 9.3*  HCT 26.5*  < > 25.0*  < > 24.3* 22.8* 28.1* 26.7* 28.0*  MCV 77.9*  --  79.4  < > 78.9 78.9 79.6 80.4 80.7  PLT 254  --  275  < > 336 354 391 406* 446*  < > = values in this interval not displayed.  INR:  Recent Labs Lab 06/20/17 0351 06/21/17 0215 06/22/17 0219 06/23/17 0300 06/24/17 0316  INR 2.41 2.13 1.72 1.89 1.69    Other results:   Imaging   Dg Chest Port 1 View  Result Date: 06/24/2017 CLINICAL DATA:  Follow-up LVAD EXAM: PORTABLE CHEST 1 VIEW COMPARISON:  06/23/2017 FINDINGS: Cardiac shadow is enlarged. Left ventricular assist device is again seen and stable. Defibrillator and right jugular dialysis catheter are seen. The small bore right subclavian central line has been removed in the interval. The overall inspiratory effort is again poor with stable changes  in the left base. No new focal abnormality is noted. IMPRESSION: No significant interval change from the prior exam aside from removal of right subclavian central line. Electronically Signed   By: Inez Catalina M.D.   On: 06/24/2017 07:01   Dg Chest Port 1 View  Result Date: 06/23/2017 CLINICAL DATA:  Left chest soreness, LVAD EXAM: PORTABLE CHEST 1 VIEW COMPARISON:  06/22/2017 FINDINGS: Support devices including LVAD are stable. Cardiomegaly. Left lower lobe atelectasis or infiltrate. Cannot exclude left effusion. Minimal right base atelectasis. IMPRESSION: No significant change since prior study. Electronically Signed   By: Rolm Baptise M.D.   On: 06/23/2017 07:24     Medications:     Scheduled Medications: . amiodarone  400 mg Oral BID  . aspirin EC  325 mg Oral Daily   Or  . aspirin  324 mg Per Tube Daily   Or  . aspirin  300 mg Rectal Daily  . atorvastatin  80 mg Oral q1800  . bisacodyl  10 mg Oral Daily   Or  . bisacodyl  10 mg Rectal Daily  . Chlorhexidine Gluconate Cloth  6 each Topical Daily  . darbepoetin (ARANESP) injection - DIALYSIS  100 mcg Intravenous Q Sat-HD  . docusate sodium  200 mg Oral Daily  . feeding supplement (GLUCERNA SHAKE)  237 mL Oral TID BM  . feeding supplement (PRO-STAT SUGAR FREE 64)  30 mL Oral TID WC  . insulin aspart  0-24 Units Subcutaneous Q4H  . mupirocin ointment   Nasal BID  . pantoprazole  40 mg Oral Daily  . sildenafil  20 mg Oral TID  . sodium chloride flush  10-40 mL Intracatheter Q12H  . sodium chloride flush  3 mL Intravenous Q12H  . Warfarin - Pharmacist Dosing Inpatient   Does not apply q1800    Infusions: . heparin 1,050 Units/hr (06/24/17 0600)  . lactated ringers Stopped (06/14/17 1400)  . lactated ringers Stopped (06/15/17 1901)    PRN Medications: heparin, hydrALAZINE, LORazepam, ondansetron (ZOFRAN) IV, oxyCODONE, promethazine, sodium chloride flush, sodium chloride flush, traMADol   Patient Profile   51 yo with  history of CAD s/p anterior MI in 2010 and chronic systolic CHF (ischemic cardiomyopathy) as well as CKD stage III  now s/p HeartMate 3 VAD placement.   Assessment/Plan:    1. Acute on chronic systolic CHF: Ischemic cardiomyopathy. St Jude ICD. NSTEMI in March 2018 with DES to LAD and RCA, complicated by cardiogenic shock, low output requiring milrinone. Most recent echo 8/18 with EF 20-25%, moderate MR. He was admitted and started on milrinone with improved creatinine.  HeartMate 3 VAD placement on 10/8. Speed cut back to 5400 with onset of afib/RVR 10/11.   Continues on milrinone 0.125 mcg/kg/min. Co-ox 59%.  - Milrinone stopped 10/19. Co-ox stable off milrinone. Will continue sildenafil for RV support.   - Holding warfarin for now and covering  with heparin pending permanent dialysis access placement on Monday (Dr. Bridgett Larsson).   - INR 1.7, INR Goal 2-3 (aim slightly higher with h/o Factor V Leiden heterozygosity) and ASA 325. Will not lower aspirin dose with Factor V Leiden. D/w PharmD  - MAP fairly stable, no changes.  - Will eventually need ramp echo before discharge (aim for Monday or Tuesday) - Continue to ambulate. Continue VAD education  - VAD parameters reviewed personally and are stable  2. AKI on CKD:  - Seen by Dr. Marval Regal. Still no renal recovery.  Minimal UOP. Has had HD x 2, now on TTS schedule. Pending permanent access on Monday.  3. CAD: NSTEMI in 3/18. LHC with 99% ulcerated lesion proximal RCA with left to right collaterals, 95% mid LAD stenosis after mid LAD stent. s/p PCI to RCA and LAD on 11/25/16.  - no s/s ischemia  - Off plavix for LVAD.   - Continue statin.   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 Recent V/Q scan did not show acute or chronic PE.  He is a heterozygote for Factor V Leiden, plan for for ASA 325 and warfarin INR 2-3 with LVAD.  5. Chronic N/V/D with gastroparesis:  -Tolerating diet.  Has occasional nausea.    6. DM: - On Jardiance at home, will  cover with sliding scale. Glucose stable.   7. S/P Multiple Tooth Extractions 4-15, 19-23, and 27-29.  8. Atrial fibrillation:  -Paroxysmal. In NSR.  Continue amio 400 mg twice a day.      9. ID: CXR with LLL atelectasis versus infiltrate. Afebrile. WBC stable ~16.  - He is off vancomycin, zosyn stopped yesterday 10. Hyponatremia: Sodium 128 today. Continue to follow. Free water restrict.  11. Anemia: Hgb 8.9 -> 9.3  today.    I reviewed the LVAD parameters from today, and compared the results to the patient's prior recorded data.  No programming changes were made.  The LVAD is functioning within specified parameters.  The patient performs LVAD self-test daily.  LVAD interrogation was negative for any significant power changes, alarms or PI events/speed drops.  LVAD equipment check completed and is in good working order.  Back-up equipment present.   LVAD education done on emergency procedures and precautions and reviewed exit site care.  [name] MD  06/24/2017 10:20 AM

## 2017-06-24 NOTE — Progress Notes (Signed)
Patient ID: [name], male   DOB: 1965/10/02, 51 y.o.   MRN: [id number] HeartMate 3 Rounding Note  Subjective:    No complaints Eating, bowels working.  Co-ox stable at 59 after stopping milrinone yesterday.   LVAD INTERROGATION:  HeartMate III LVAD:  Flow 4.1 liters/min, speed 5400, power 3.9, PI 4.1.  No PI events  Objective:    Vital Signs:   Temp:  [97.1 F (36.2 C)-98.8 F (37.1 C)] 97.8 F (36.6 C) (10/20 0802) Pulse Rate:  [55-97] 92 (10/20 1000) Resp:  [5-30] 29 (10/20 1000) BP: (89-131)/(72-112) 120/90 (10/20 1000) SpO2:  [92 %-99 %] 93 % (10/20 1000) Weight:  [93.8 kg (206 lb 12.7 oz)] 93.8 kg (206 lb 12.7 oz) (10/20 0354) Last BM Date: 06/24/17 Mean arterial Pressure 80-90's  Intake/Output:   Intake/Output Summary (Last 24 hours) at 06/24/17 1050 Last data filed at 06/24/17 0928  Gross per 24 hour  Intake          1228.39 ml  Output                1 ml  Net          1227.39 ml     Physical Exam: General:  Well appearing. No resp difficulty HEENT: normal Cor:  LVAD hum present. Chest incision healing well Lungs: clear Abdomen: soft, nontender, nondistended. No hepatosplenomegaly. No bruits or masses. Good bowel sounds. Extremities: no cyanosis, clubbing, rash, edema Neuro: alert & orientedx3, cranial nerves grossly intact. moves all 4 extremities w/o difficulty. Affect pleasant  Telemetry: sinus 90's  Labs: Basic Metabolic Panel:  Recent Labs Lab 06/19/17 1207 06/20/17 0351 06/21/17 0215 06/22/17 0219 06/22/17 1649 06/23/17 0300 06/24/17 0316  NA 131* 129* 127* 127* 134* 131* 128*  K 4.1 3.7 3.9 4.0 4.0 4.0 4.1  CL 97* 93* 91* 92* 97* 96* 92*  CO2 25 23 23 24 28 27 24   GLUCOSE 365* 144* 148* 174* 102* 87 185*  BUN 15 20 17  29* 9 16 33*  CREATININE 2.93* 4.52* 4.04* 6.17* 3.16* 4.51* 6.63*  CALCIUM 7.7* 7.8* 7.7* 8.0* 7.9* 7.9* 8.2*  MG  --  2.2 1.9 2.0  --  1.9 2.1  PHOS 2.0* 2.6 1.8* 3.4 1.6*  --   --     Liver Function  Tests:  Recent Labs Lab 06/20/17 0351 06/21/17 0215 06/22/17 0219 06/22/17 1649 06/23/17 0300 06/24/17 0316  AST 25 29 24   --  27 24  ALT 15* 17 15*  --  16* 16*  ALKPHOS 113 124 115  --  124 136*  BILITOT 1.0 1.0 0.7  --  0.6 0.8  PROT 6.2* 6.2* 6.3*  --  6.3* 7.0  ALBUMIN 2.2* 2.2* 2.1* 2.2* 2.2* 2.3*   No results for input(s): LIPASE, AMYLASE in the last 168 hours. No results for input(s): AMMONIA in the last 168 hours.  CBC:  Recent Labs Lab 06/18/17 0315  06/19/17 0326  06/21/17 0215 06/22/17 0219 06/22/17 1650 06/23/17 0300 06/24/17 0316  WBC 14.9*  --  14.1*  < > 17.6* 16.0* 16.6* 16.1* 16.8*  NEUTROABS 10.3*  --  10.6*  --   --   --   --   --   --   HGB 8.8*  < > 8.6*  < > 8.0* 7.7* 9.3* 8.9* 9.3*  HCT 26.5*  < > 25.0*  < > 24.3* 22.8* 28.1* 26.7* 28.0*  MCV 77.9*  --  79.4  < > 78.9 78.9 79.6 80.4  80.7  PLT 254  --  275  < > 336 354 391 406* 446*  < > = values in this interval not displayed.  INR:  Recent Labs Lab 06/20/17 0351 06/21/17 0215 06/22/17 0219 06/23/17 0300 06/24/17 0316  INR 2.41 2.13 1.72 1.89 1.69    Other results:  EKG:   Imaging: Dg Chest Port 1 View  Result Date: 06/24/2017 CLINICAL DATA:  Follow-up LVAD EXAM: PORTABLE CHEST 1 VIEW COMPARISON:  06/23/2017 FINDINGS: Cardiac shadow is enlarged. Left ventricular assist device is again seen and stable. Defibrillator and right jugular dialysis catheter are seen. The small bore right subclavian central line has been removed in the interval. The overall inspiratory effort is again poor with stable changes in the left base. No new focal abnormality is noted. IMPRESSION: No significant interval change from the prior exam aside from removal of right subclavian central line. Electronically Signed   By: Inez Catalina M.D.   On: 06/24/2017 07:01   Dg Chest Port 1 View  Result Date: 06/23/2017 CLINICAL DATA:  Left chest soreness, LVAD EXAM: PORTABLE CHEST 1 VIEW COMPARISON:  06/22/2017  FINDINGS: Support devices including LVAD are stable. Cardiomegaly. Left lower lobe atelectasis or infiltrate. Cannot exclude left effusion. Minimal right base atelectasis. IMPRESSION: No significant change since prior study. Electronically Signed   By: Rolm Baptise M.D.   On: 06/23/2017 07:24      Medications:     Scheduled Medications: . amiodarone  400 mg Oral BID  . aspirin EC  325 mg Oral Daily   Or  . aspirin  324 mg Per Tube Daily   Or  . aspirin  300 mg Rectal Daily  . atorvastatin  80 mg Oral q1800  . bisacodyl  10 mg Oral Daily   Or  . bisacodyl  10 mg Rectal Daily  . Chlorhexidine Gluconate Cloth  6 each Topical Daily  . darbepoetin (ARANESP) injection - DIALYSIS  100 mcg Intravenous Q Sat-HD  . docusate sodium  200 mg Oral Daily  . feeding supplement (GLUCERNA SHAKE)  237 mL Oral TID BM  . feeding supplement (PRO-STAT SUGAR FREE 64)  30 mL Oral TID WC  . insulin aspart  0-24 Units Subcutaneous Q4H  . mupirocin ointment   Nasal BID  . pantoprazole  40 mg Oral Daily  . sildenafil  20 mg Oral TID  . sodium chloride flush  10-40 mL Intracatheter Q12H  . sodium chloride flush  3 mL Intravenous Q12H  . Warfarin - Pharmacist Dosing Inpatient   Does not apply q1800     Infusions: . heparin 1,050 Units/hr (06/24/17 0600)  . lactated ringers Stopped (06/14/17 1400)  . lactated ringers Stopped (06/15/17 1901)     PRN Medications:  heparin, hydrALAZINE, LORazepam, ondansetron (ZOFRAN) IV, oxyCODONE, promethazine, sodium chloride flush, sodium chloride flush, traMADol   Assessment:   He is hemodynamically stable with stable VAD parameters. Co-ox stable at 59 off milrinone.  AKI/CKD with anuria. Plan HD today. Reportedly made a little urine yesterday.  Remains on heparin pending HD catheter.  Plan/Discussion:    HD today, then remove catheter and plan on tunneled catheter on Monday.  Continue heparin.  I reviewed the LVAD parameters from today, and compared  the results to the patient's prior recorded data.  No programming changes were made.  The LVAD is functioning within specified parameters.  The patient performs LVAD self-test daily.  LVAD interrogation was negative for any significant power changes, alarms or PI events/speed drops.  LVAD equipment check completed and is in good working order.  Back-up equipment present.   LVAD education done on emergency procedures and precautions and reviewed exit site care.  Length of Stay: [place] 06/24/2017, 10:50 AM

## 2017-06-24 NOTE — Progress Notes (Signed)
[place] for Coumadin/Heparin Indication: LVAD, h/o DVT Factor V Leiden heterozygosity  Allergies  Allergen Reactions  . Metformin And Related Diarrhea    Patient Measurements: Height: 5' 8"  (172.7 cm) Weight: 206 lb 12.7 oz (93.8 kg) IBW/kg (Calculated) : 68.4   Vital Signs: Temp: 97.8 F (36.6 C) (10/20 0802) Temp Source: Oral (10/20 0802) BP: 96/72 (10/20 0600) Pulse Rate: 89 (10/20 0600)  Labs:  Recent Labs  06/22/17 0219 06/22/17 1649 06/22/17 1650  06/23/17 0300 06/23/17 0848 06/24/17 0316  HGB 7.7*  --  9.3*  --  8.9*  --  9.3*  HCT 22.8*  --  28.1*  --  26.7*  --  28.0*  PLT 354  --  391  --  406*  --  446*  LABPROT 20.0*  --   --   --  21.5*  --  19.7*  INR 1.72  --   --   --  1.89  --  1.69  HEPARINUNFRC  --   --   --   < > 0.31 0.30 0.36  CREATININE 6.17* 3.16*  --   --  4.51*  --  6.63*  < > = values in this interval not displayed.  Estimated Creatinine Clearance: 14.5 mL/min (A) (by C-G formula based on SCr of 6.63 mg/dL (H)).   Medical History: Past Medical History:  Diagnosis Date  . Angina   . ASCVD (arteriosclerotic cardiovascular disease)    , Anterior infarction 2005, LAD diagonal bifurcation intervention 03/2004  . Automatic implantable cardiac defibrillator -[employer]       . Benign neoplasm of colon   . CHF (congestive heart failure) ([place])   . Chronic systolic heart failure ([place])   . Coronary artery disease     Widely patent previously placed stents in the left anterior   . Crohn's disease ([place])   . 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 Dyspnea   . Gastroparesis   . GERD (gastroesophageal reflux disease)   . High cholesterol   . Hyperlipidemia   . Hypersomnolent    Previous diagnosis of narcolepsy  . Hypertension, essential   . Ischemic cardiomyopathy    Ejection fraction 15-20% catheterization 2010  . Type II or unspecified type diabetes mellitus without mention of  complication, not stated as uncontrolled   . Unspecified gastritis and gastroduodenitis without mention of hemorrhage     Assessment: 33 yoM on chronic Coumadin for Factor 5 Leiden and hx DVTs admitted for LVAD placement. Heparin started on 10/19 with subtherapeutic INR, now planning to hold warfarin over the weekend and cover with heparin as pt will need vascular access placed Monday for HD.   INR trending downward from 1.89 to 1.69, with last dose of warfarin on 10/18. CBC/LDH remains stable. No infusion issues per nursing. No signs/symptoms of bleeding. Heparin level came back within goal range at 0.36 on heparin 1050 units/hr.   Goal of Therapy:  INR 2-3  Heparin Level 0.3-0.7 Monitor platelets by anticoagulation protocol: Yes   Plan:  -Hold Coumadin -Continue heparin at 1050 units/hr -Monitor heparin level,CBC, INR daily  Doylene Canard, PharmD Clinical Pharmacist  Phone: [phone] 06/24/2017

## 2017-06-24 NOTE — Progress Notes (Signed)
Patient ID: [name], male   DOB: 12/11/1965, 51 y.o.   MRN: [id number] [facility] Evening Rounds  Hemodynamically stable. MAP 92. VAD parameters stable. Has not been dialyzed yet due to emergency. Planning to do it tonight. Had BM today.

## 2017-06-24 NOTE — Progress Notes (Signed)
Patient ID: [name], male   DOB: [birth year], 51 y.o.   MRN: [id number] S: Feels well and tolerated HD with UF of 1.5L yesterday O:BP 96/72   Pulse 89   Temp 97.8 F (36.6 C) (Oral)   Resp (!) 30   Ht 5' 8"  (1.727 m)   Wt 93.8 kg (206 lb 12.7 oz)   SpO2 95%   BMI 31.44 kg/m   Intake/Output Summary (Last 24 hours) at 06/24/17 0858 Last data filed at 06/24/17 0600  Gross per 24 hour  Intake          1573.89 ml  Output                1 ml  Net          1572.89 ml   Intake/Output: I/O last 3 completed shifts: In: 2551.3 [P.O.:2064; I.V.:437.3; IV Piggyback:50] Out: 1 [Stool:1]  Intake/Output this shift:  No intake/output data recorded. Weight change: -2.9 kg (-6 lb 6.3 oz) Gen: NAD CVS: mechanical hum Resp: cta Abd: benign Ext: no edema   Recent Labs Lab 06/18/17 0315 06/18/17 1624  06/19/17 0326 06/19/17 1207 06/20/17 0351 06/21/17 0215 06/22/17 0219 06/22/17 1649 06/23/17 0300 06/24/17 0316  NA 137 136  < > 135 131* 129* 127* 127* 134* 131* 128*  K 3.6 4.4  < > 4.3 4.1 3.7 3.9 4.0 4.0 4.0 4.1  CL 98* 100*  < > 99* 97* 93* 91* 92* 97* 96* 92*  CO2 25 27  --  23 25 23 23 24 28 27 24   GLUCOSE 137* 116*  < > 125* 365* 144* 148* 174* 102* 87 185*  BUN 15 12  < > 10 15 20 17  29* 9 16 33*  CREATININE 2.44* 2.44*  < > 2.52* 2.93* 4.52* 4.04* 6.17* 3.16* 4.51* 6.63*  ALBUMIN 2.7* 2.5*  --  2.5* 2.4* 2.2* 2.2* 2.1* 2.2* 2.2* 2.3*  CALCIUM 8.2* 7.9*  --  8.0* 7.7* 7.8* 7.7* 8.0* 7.9* 7.9* 8.2*  PHOS 1.5* 2.0*  --  1.9* 2.0* 2.6 1.8* 3.4 1.6*  --   --   AST 33  --   --  33  --  25 29 24   --  27 24  ALT 19  --   --  19  --  15* 17 15*  --  16* 16*  < > = values in this interval not displayed. Liver Function Tests:  Recent Labs Lab 06/22/17 0219 06/22/17 1649 06/23/17 0300 06/24/17 0316  AST 24  --  27 24  ALT 15*  --  16* 16*  ALKPHOS 115  --  124 136*  BILITOT 0.7  --  0.6 0.8  PROT 6.3*  --  6.3* 7.0  ALBUMIN 2.1* 2.2* 2.2* 2.3*   No results for  input(s): LIPASE, AMYLASE in the last 168 hours. No results for input(s): AMMONIA in the last 168 hours. CBC:  Recent Labs Lab 06/18/17 0315  06/19/17 0326  06/21/17 0215 06/22/17 0219 06/22/17 1650 06/23/17 0300 06/24/17 0316  WBC 14.9*  --  14.1*  < > 17.6* 16.0* 16.6* 16.1* 16.8*  NEUTROABS 10.3*  --  10.6*  --   --   --   --   --   --   HGB 8.8*  < > 8.6*  < > 8.0* 7.7* 9.3* 8.9* 9.3*  HCT 26.5*  < > 25.0*  < > 24.3* 22.8* 28.1* 26.7* 28.0*  MCV 77.9*  --  79.4  < >  78.9 78.9 79.6 80.4 80.7  PLT 254  --  275  < > 336 354 391 406* 446*  < > = values in this interval not displayed. Cardiac Enzymes: No results for input(s): CKTOTAL, CKMB, CKMBINDEX, TROPONINI in the last 168 hours. CBG:  Recent Labs Lab 06/23/17 1721 06/23/17 1939 06/24/17 0001 06/24/17 0330 06/24/17 0805  GLUCAP 194* 167* 177* 186* 158*    Iron Studies: No results for input(s): IRON, TIBC, TRANSFERRIN, FERRITIN in the last 72 hours. Studies/Results: Dg Chest Port 1 View  Result Date: 06/24/2017 CLINICAL DATA:  Follow-up LVAD EXAM: PORTABLE CHEST 1 VIEW COMPARISON:  06/23/2017 FINDINGS: Cardiac shadow is enlarged. Left ventricular assist device is again seen and stable. Defibrillator and right jugular dialysis catheter are seen. The small bore right subclavian central line has been removed in the interval. The overall inspiratory effort is again poor with stable changes in the left base. No new focal abnormality is noted. IMPRESSION: No significant interval change from the prior exam aside from removal of right subclavian central line. Electronically Signed   By: Inez Catalina M.D.   On: 06/24/2017 07:01   Dg Chest Port 1 View  Result Date: 06/23/2017 CLINICAL DATA:  Left chest soreness, LVAD EXAM: PORTABLE CHEST 1 VIEW COMPARISON:  06/22/2017 FINDINGS: Support devices including LVAD are stable. Cardiomegaly. Left lower lobe atelectasis or infiltrate. Cannot exclude left effusion. Minimal right base  atelectasis. IMPRESSION: No significant change since prior study. Electronically Signed   By: Rolm Baptise M.D.   On: 06/23/2017 07:24   . amiodarone  400 mg Oral BID  . aspirin EC  325 mg Oral Daily   Or  . aspirin  324 mg Per Tube Daily   Or  . aspirin  300 mg Rectal Daily  . atorvastatin  80 mg Oral q1800  . bisacodyl  10 mg Oral Daily   Or  . bisacodyl  10 mg Rectal Daily  . Chlorhexidine Gluconate Cloth  6 each Topical Daily  . darbepoetin (ARANESP) injection - DIALYSIS  100 mcg Intravenous Q Sat-HD  . docusate sodium  200 mg Oral Daily  . feeding supplement (GLUCERNA SHAKE)  237 mL Oral TID BM  . feeding supplement (PRO-STAT SUGAR FREE 64)  30 mL Oral TID WC  . insulin aspart  0-24 Units Subcutaneous Q4H  . mupirocin ointment   Nasal BID  . pantoprazole  40 mg Oral Daily  . sildenafil  20 mg Oral TID  . sodium chloride flush  10-40 mL Intracatheter Q12H  . sodium chloride flush  3 mL Intravenous Q12H  . Warfarin - Pharmacist Dosing Inpatient   Does not apply q1800    BMET    Component Value Date/Time   NA 128 (L) 06/24/2017 0316   NA 144 05/11/2017 0938   K 4.1 06/24/2017 0316   CL 92 (L) 06/24/2017 0316   CO2 24 06/24/2017 0316   GLUCOSE 185 (H) 06/24/2017 0316   BUN 33 (H) 06/24/2017 0316   BUN 68 (H) 05/11/2017 0938   CREATININE 6.63 (H) 06/24/2017 0316   CREATININE 1.63 (H) 11/09/2016 1505   CALCIUM 8.2 (L) 06/24/2017 0316   GFRNONAA 9 (L) 06/24/2017 0316   GFRNONAA 48 (L) 11/09/2016 1505   GFRAA 10 (L) 06/24/2017 0316   GFRAA 55 (L) 11/09/2016 1505   CBC    Component Value Date/Time   WBC 16.8 (H) 06/24/2017 0316   RBC 3.47 (L) 06/24/2017 0316   HGB 9.3 (L) 06/24/2017 0316  HGB 11.7 (L) 09/07/2016 1457   HCT 28.0 (L) 06/24/2017 0316   HCT 37.5 09/07/2016 1457   PLT 446 (H) 06/24/2017 0316   PLT 279 09/07/2016 1457   MCV 80.7 06/24/2017 0316   MCV 81 09/07/2016 1457   MCH 26.8 06/24/2017 0316   MCHC 33.2 06/24/2017 0316   RDW 17.5 (H) 06/24/2017  0316   RDW 14.9 09/07/2016 1457   LYMPHSABS 1.6 06/19/2017 0326   LYMPHSABS 1.3 09/07/2016 1457   MONOABS 1.8 (H) 06/19/2017 0326   EOSABS 0.1 06/19/2017 0326   EOSABS 0.2 09/07/2016 1457   BASOSABS 0.0 06/19/2017 0326   BASOSABS 0.0 09/07/2016 1457     Assessment/Plan:  1. AKI/CKD oliguric/anuric in setting of cardiogenic shock and +/- vanc toxicity. Has tolerated CVVHD since 06/15/17 with marked improvement in volume (net negative 7 liters over a3 day period) and stopped 06/19/17. Unfortunately he has remained anuric  1. S/p first IHD 06/20/17 with some drop in BP and only negative 750 2. Did not respond to dose of lasix 197m IV x 1 to see if he will start producing urine now that BP's have improved 3. Plan for HD on MWF schedulefor now and follow UOP. 2. Vascular access- will need tunneled HD catheter and AVF/AVG sincehe remains dialysis-dependent.  [facility]  Will remove temp HD cath today since it has been in for 9 days and increases risk of infection. 3. 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  4. CAD s/p NSTEMI 3/18. Off plavix due to LVAD 5. H/o DVT's on coumadin  6. DM- per primary svc 7. ABLA/Anemia of CKD- follow H/H, on Aranesp. Transfuse prn 8. A fib- per Cardiology, on amiodarone. JDonetta Potts MD [employer]([phone]

## 2017-06-24 NOTE — Plan of Care (Signed)
Problem: Activity: Goal: Risk for activity intolerance will decrease Outcome: Progressing Pt ambulated with assistance after performing battery exchange independently. Pt motivated for increased activity, requiring minimal assistance. Able to ambulate 370 ft multiple time a day without difficulty.  Problem: Fluid Volume: Goal: Risk for excess fluid volume will decrease Outcome: Not Progressing Pt still requiring scheduled hemodialysis, remaining anuric. Scheduled for Tunneling HD cath. And AV fistula creation 06-26-17.

## 2017-06-24 NOTE — Progress Notes (Signed)
Pt daily LVAD dressing was changed by Pt daughter with RN at bedside to support and educate during sterile procedure.

## 2017-06-25 LAB — LACTATE DEHYDROGENASE: LDH: 316 U/L — AB (ref 98–192)

## 2017-06-25 LAB — RENAL FUNCTION PANEL
ALBUMIN: 2.3 g/dL — AB (ref 3.5–5.0)
Anion gap: 14 (ref 5–15)
BUN: 27 mg/dL — AB (ref 6–20)
CALCIUM: 8.3 mg/dL — AB (ref 8.9–10.3)
CO2: 22 mmol/L (ref 22–32)
Chloride: 97 mmol/L — ABNORMAL LOW (ref 101–111)
Creatinine, Ser: 5.56 mg/dL — ABNORMAL HIGH (ref 0.61–1.24)
GFR calc Af Amer: 12 mL/min — ABNORMAL LOW (ref >60)
GFR calc non Af Amer: 11 mL/min — ABNORMAL LOW (ref >60)
GLUCOSE: 187 mg/dL — AB (ref 65–99)
PHOSPHORUS: 3.5 mg/dL (ref 2.5–4.6)
Potassium: 4.2 mmol/L (ref 3.5–5.1)
SODIUM: 133 mmol/L — AB (ref 135–145)

## 2017-06-25 LAB — GLUCOSE, CAPILLARY
GLUCOSE-CAPILLARY: 135 mg/dL — AB (ref 65–99)
GLUCOSE-CAPILLARY: 150 mg/dL — AB (ref 65–99)
Glucose-Capillary: 193 mg/dL — ABNORMAL HIGH (ref 65–99)
Glucose-Capillary: 278 mg/dL — ABNORMAL HIGH (ref 65–99)
Glucose-Capillary: 184 mg/dL — ABNORMAL HIGH (ref 65–99)
Glucose-Capillary: 221 mg/dL — ABNORMAL HIGH (ref 65–99)

## 2017-06-25 LAB — MAGNESIUM: Magnesium: 1.8 mg/dL (ref 1.7–2.4)

## 2017-06-25 LAB — PROTIME-INR
INR: 1.46
PROTHROMBIN TIME: 17.6 s — AB (ref 11.4–15.2)

## 2017-06-25 LAB — HEPARIN LEVEL (UNFRACTIONATED): Heparin Unfractionated: 0.53 IU/mL (ref 0.30–0.70)

## 2017-06-25 MED ORDER — HYDRALAZINE HCL 25 MG PO TABS
12.5000 mg | ORAL_TABLET | Freq: Three times a day (TID) | ORAL | Status: DC
  Administered 2017-06-25 – 2017-06-26 (×3): 12.5 mg via ORAL
  Filled 2017-06-25 (×3): qty 1

## 2017-06-25 NOTE — Progress Notes (Signed)
Patient ID: [name], male   DOB: December 03, 1965, 51 y.o.   MRN: [id number] S:Feels well and tolerated HD yesterday.   O:BP 110/83   Pulse 95   Temp 98 F (36.7 C) (Oral)   Resp 17   Ht 5' 8"  (1.727 m)   Wt 93.9 kg (207 lb 0.2 oz)   SpO2 92%   BMI 31.48 kg/m   Intake/Output Summary (Last 24 hours) at 06/25/17 1126 Last data filed at 06/25/17 1100  Gross per 24 hour  Intake             1212 ml  Output             2000 ml  Net             -788 ml   Intake/Output: I/O last 3 completed shifts: In: 1939.5 [P.O.:1500; I.V.:439.5] Out: 2001 [Other:2000; Stool:1]  Intake/Output this shift:  Total I/O In: 222 [P.O.:180; I.V.:42] Out: -  Weight change: 2 kg (4 lb 6.6 oz) Gen: NAD CVS: mechanical hum Resp: cta EHU:[id number] Ext: no edema   Recent Labs Lab 06/19/17 0326 06/19/17 1207 06/20/17 0351 06/21/17 0215 06/22/17 0219 06/22/17 1649 06/23/17 0300 06/24/17 0316 06/25/17 0349  NA 135 131* 129* 127* 127* 134* 131* 128* 133*  K 4.3 4.1 3.7 3.9 4.0 4.0 4.0 4.1 4.2  CL 99* 97* 93* 91* 92* 97* 96* 92* 97*  CO2 23 25 23 23 24 28 27 24 22   GLUCOSE 125* 365* 144* 148* 174* 102* 87 185* 187*  BUN 10 15 20 17  29* 9 16 33* 27*  CREATININE 2.52* 2.93* 4.52* 4.04* 6.17* 3.16* 4.51* 6.63* 5.56*  ALBUMIN 2.5* 2.4* 2.2* 2.2* 2.1* 2.2* 2.2* 2.3* 2.3*  CALCIUM 8.0* 7.7* 7.8* 7.7* 8.0* 7.9* 7.9* 8.2* 8.3*  PHOS 1.9* 2.0* 2.6 1.8* 3.4 1.6*  --   --  3.5  AST 33  --  25 29 24   --  27 24  --   ALT 19  --  15* 17 15*  --  16* 16*  --    Liver Function Tests:  Recent Labs Lab 06/22/17 0219  06/23/17 0300 06/24/17 0316 06/25/17 0349  AST 24  --  27 24  --   ALT 15*  --  16* 16*  --   ALKPHOS 115  --  124 136*  --   BILITOT 0.7  --  0.6 0.8  --   PROT 6.3*  --  6.3* 7.0  --   ALBUMIN 2.1*  < > 2.2* 2.3* 2.3*  < > = values in this interval not displayed. No results for input(s): LIPASE, AMYLASE in the last 168 hours. No results for input(s): AMMONIA in the last 168  hours. CBC:  Recent Labs Lab 06/19/17 0326  06/21/17 0215 06/22/17 0219 06/22/17 1650 06/23/17 0300 06/24/17 0316  WBC 14.1*  < > 17.6* 16.0* 16.6* 16.1* 16.8*  NEUTROABS 10.6*  --   --   --   --   --   --   HGB 8.6*  < > 8.0* 7.7* 9.3* 8.9* 9.3*  HCT 25.0*  < > 24.3* 22.8* 28.1* 26.7* 28.0*  MCV 79.4  < > 78.9 78.9 79.6 80.4 80.7  PLT 275  < > 336 354 391 406* 446*  < > = values in this interval not displayed. Cardiac Enzymes: No results for input(s): CKTOTAL, CKMB, CKMBINDEX, TROPONINI in the last 168 hours. CBG:  Recent Labs Lab 06/24/17 1700 06/24/17 2048 06/24/17 2357  06/25/17 0409 06/25/17 0806  GLUCAP 242* 154* 135* 193* 150*    Iron Studies: No results for input(s): IRON, TIBC, TRANSFERRIN, FERRITIN in the last 72 hours. Studies/Results: Dg Chest Port 1 View  Result Date: 06/24/2017 CLINICAL DATA:  Follow-up LVAD EXAM: PORTABLE CHEST 1 VIEW COMPARISON:  06/23/2017 FINDINGS: Cardiac shadow is enlarged. Left ventricular assist device is again seen and stable. Defibrillator and right jugular dialysis catheter are seen. The small bore right subclavian central line has been removed in the interval. The overall inspiratory effort is again poor with stable changes in the left base. No new focal abnormality is noted. IMPRESSION: No significant interval change from the prior exam aside from removal of right subclavian central line. Electronically Signed   By: Inez Catalina M.D.   On: 06/24/2017 07:01   . amiodarone  400 mg Oral BID  . aspirin EC  325 mg Oral Daily   Or  . aspirin  324 mg Per Tube Daily   Or  . aspirin  300 mg Rectal Daily  . atorvastatin  80 mg Oral q1800  . bisacodyl  10 mg Oral Daily   Or  . bisacodyl  10 mg Rectal Daily  . Chlorhexidine Gluconate Cloth  6 each Topical Daily  . darbepoetin (ARANESP) injection - DIALYSIS  100 mcg Intravenous Q Sat-HD  . docusate sodium  200 mg Oral Daily  . feeding supplement (GLUCERNA SHAKE)  237 mL Oral TID BM  .  feeding supplement (PRO-STAT SUGAR FREE 64)  30 mL Oral TID WC  . hydrALAZINE  12.5 mg Oral Q8H  . insulin aspart  0-24 Units Subcutaneous Q4H  . mupirocin ointment   Nasal BID  . pantoprazole  40 mg Oral Daily  . sildenafil  20 mg Oral TID  . sodium chloride flush  10-40 mL Intracatheter Q12H  . sodium chloride flush  3 mL Intravenous Q12H  . Warfarin - Pharmacist Dosing Inpatient   Does not apply q1800    BMET    Component Value Date/Time   NA 133 (L) 06/25/2017 0349   NA 144 05/11/2017 0938   K 4.2 06/25/2017 0349   CL 97 (L) 06/25/2017 0349   CO2 22 06/25/2017 0349   GLUCOSE 187 (H) 06/25/2017 0349   BUN 27 (H) 06/25/2017 0349   BUN 68 (H) 05/11/2017 0938   CREATININE 5.56 (H) 06/25/2017 0349   CREATININE 1.63 (H) 11/09/2016 1505   CALCIUM 8.3 (L) 06/25/2017 0349   GFRNONAA 11 (L) 06/25/2017 0349   GFRNONAA 48 (L) 11/09/2016 1505   GFRAA 12 (L) 06/25/2017 0349   GFRAA 55 (L) 11/09/2016 1505   CBC    Component Value Date/Time   WBC 16.8 (H) 06/24/2017 0316   RBC 3.47 (L) 06/24/2017 0316   HGB 9.3 (L) 06/24/2017 0316   HGB 11.7 (L) 09/07/2016 1457   HCT 28.0 (L) 06/24/2017 0316   HCT 37.5 09/07/2016 1457   PLT 446 (H) 06/24/2017 0316   PLT 279 09/07/2016 1457   MCV 80.7 06/24/2017 0316   MCV 81 09/07/2016 1457   MCH 26.8 06/24/2017 0316   MCHC 33.2 06/24/2017 0316   RDW 17.5 (H) 06/24/2017 0316   RDW 14.9 09/07/2016 1457   LYMPHSABS 1.6 06/19/2017 0326   LYMPHSABS 1.3 09/07/2016 1457   MONOABS 1.8 (H) 06/19/2017 0326   EOSABS 0.1 06/19/2017 0326   EOSABS 0.2 09/07/2016 1457   BASOSABS 0.0 06/19/2017 0326   BASOSABS 0.0 09/07/2016 1457    Assessment/Plan:  1.  AKI/CKD oliguric/anuric in setting of cardiogenic shock and +/- vanc toxicity. Has tolerated CVVHD since 06/15/17 with marked improvement in volume (net negative 7 liters over a3 day period) and stopped 06/19/17. Unfortunately he has remained anuric  1. S/p first IHD 06/20/17 with some drop in BP  and only negative 750 2. Did not respond to dose of lasix 146m IV x 1 to see if he will start producing urine now that BP's have improved 3. Plan for HD on TTS schedulefor now and and await outpatient placement and schedule 4. Continue to follow UOP as he may have some recovery of renal function now with better hemodynamics.. 2. Vascular access- will need tunneled HD catheter and AVF/AVG sincehe remains dialysis-dependent. [facility].  1. temp HD cath removed 06/24/17. 3. 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  4. CAD s/p NSTEMI 3/18. Off plavix due to LVAD 5. H/o DVT's on coumadin  6. DM- per primary svc 7. ABLA/Anemia of CKD- follow H/H, on Aranesp. Transfuse prn 8. A fib- per Cardiology, on amiodarone  JDonetta Potts MD [facility]([phone]

## 2017-06-25 NOTE — Progress Notes (Signed)
Patient ID: [name], male   DOB: [birth year], 51 y.o.   MRN: [id number] HeartMate 3 Rounding Note  Subjective:    No complaints Had HD yesterday and removed 2L. Says his breathing is better.   LVAD INTERROGATION:  HeartMate III LVAD:  Flow 4.1 liters/min, speed 5400, power 4.0, PI 3.8.  No PI events  Objective:    Vital Signs:   Temp:  [97.8 F (36.6 C)-98.5 F (36.9 C)] 98.1 F (36.7 C) (10/21 1204) Pulse Rate:  [33-98] 95 (10/21 1100) Resp:  [13-29] 17 (10/21 1100) BP: (89-131)/(68-94) 110/83 (10/21 1100) SpO2:  [85 %-96 %] 92 % (10/21 1100) Weight:  [93.9 kg (206 lb 15.8 oz)-95.8 kg (211 lb 3.2 oz)] 93.9 kg (207 lb 0.2 oz) (10/21 0500) Last BM Date: 06/25/17 Mean arterial Pressure 90's to 100 this am and received prn IV hydralazine several times overnight.   Intake/Output:   Intake/Output Summary (Last 24 hours) at 06/25/17 1208 Last data filed at 06/25/17 1100  Gross per 24 hour  Intake           1081.5 ml  Output             2000 ml  Net           -918.5 ml     Physical Exam: General:  Well appearing. No resp difficulty HEENT: normal Cor:  LVAD hum present. Chest incision ok Lungs: clear Abdomen: soft, nontender, nondistended. No hepatosplenomegaly. No bruits or masses. Good bowel sounds. Extremities: no cyanosis, clubbing, rash, edema Neuro: alert & orientedx3, cranial nerves grossly intact. moves all 4 extremities w/o difficulty. Affect pleasant  Telemetry: sinus 90's  Labs: Basic Metabolic Panel:  Recent Labs Lab 06/20/17 0351 06/21/17 0215 06/22/17 0219 06/22/17 1649 06/23/17 0300 06/24/17 0316 06/25/17 0349  NA 129* 127* 127* 134* 131* 128* 133*  K 3.7 3.9 4.0 4.0 4.0 4.1 4.2  CL 93* 91* 92* 97* 96* 92* 97*  CO2 23 23 24 28 27 24 22   GLUCOSE 144* 148* 174* 102* 87 185* 187*  BUN 20 17 29* 9 16 33* 27*  CREATININE 4.52* 4.04* 6.17* 3.16* 4.51* 6.63* 5.56*  CALCIUM 7.8* 7.7* 8.0* 7.9* 7.9* 8.2* 8.3*  MG 2.2 1.9 2.0  --  1.9 2.1 1.8   PHOS 2.6 1.8* 3.4 1.6*  --   --  3.5    Liver Function Tests:  Recent Labs Lab 06/20/17 0351 06/21/17 0215 06/22/17 0219 06/22/17 1649 06/23/17 0300 06/24/17 0316 06/25/17 0349  AST 25 29 24   --  27 24  --   ALT 15* 17 15*  --  16* 16*  --   ALKPHOS 113 124 115  --  124 136*  --   BILITOT 1.0 1.0 0.7  --  0.6 0.8  --   PROT 6.2* 6.2* 6.3*  --  6.3* 7.0  --   ALBUMIN 2.2* 2.2* 2.1* 2.2* 2.2* 2.3* 2.3*   No results for input(s): LIPASE, AMYLASE in the last 168 hours. No results for input(s): AMMONIA in the last 168 hours.  CBC:  Recent Labs Lab 06/19/17 0326  06/21/17 0215 06/22/17 0219 06/22/17 1650 06/23/17 0300 06/24/17 0316  WBC 14.1*  < > 17.6* 16.0* 16.6* 16.1* 16.8*  NEUTROABS 10.6*  --   --   --   --   --   --   HGB 8.6*  < > 8.0* 7.7* 9.3* 8.9* 9.3*  HCT 25.0*  < > 24.3* 22.8* 28.1* 26.7* 28.0*  MCV  79.4  < > 78.9 78.9 79.6 80.4 80.7  PLT 275  < > 336 354 391 406* 446*  < > = values in this interval not displayed.  INR:  Recent Labs Lab 06/21/17 0215 06/22/17 0219 06/23/17 0300 06/24/17 0316 06/25/17 0349  INR 2.13 1.72 1.89 1.69 1.46    Other results:  EKG:   Imaging: Dg Chest Port 1 View  Result Date: 06/24/2017 CLINICAL DATA:  Follow-up LVAD EXAM: PORTABLE CHEST 1 VIEW COMPARISON:  06/23/2017 FINDINGS: Cardiac shadow is enlarged. Left ventricular assist device is again seen and stable. Defibrillator and right jugular dialysis catheter are seen. The small bore right subclavian central line has been removed in the interval. The overall inspiratory effort is again poor with stable changes in the left base. No new focal abnormality is noted. IMPRESSION: No significant interval change from the prior exam aside from removal of right subclavian central line. Electronically Signed   By: Inez Catalina M.D.   On: 06/24/2017 07:01      Medications:     Scheduled Medications: . amiodarone  400 mg Oral BID  . aspirin EC  325 mg Oral Daily   Or   . aspirin  324 mg Per Tube Daily   Or  . aspirin  300 mg Rectal Daily  . atorvastatin  80 mg Oral q1800  . bisacodyl  10 mg Oral Daily   Or  . bisacodyl  10 mg Rectal Daily  . Chlorhexidine Gluconate Cloth  6 each Topical Daily  . darbepoetin (ARANESP) injection - DIALYSIS  100 mcg Intravenous Q Sat-HD  . docusate sodium  200 mg Oral Daily  . feeding supplement (GLUCERNA SHAKE)  237 mL Oral TID BM  . feeding supplement (PRO-STAT SUGAR FREE 64)  30 mL Oral TID WC  . hydrALAZINE  12.5 mg Oral Q8H  . insulin aspart  0-24 Units Subcutaneous Q4H  . mupirocin ointment   Nasal BID  . pantoprazole  40 mg Oral Daily  . sildenafil  20 mg Oral TID  . sodium chloride flush  10-40 mL Intracatheter Q12H  . sodium chloride flush  3 mL Intravenous Q12H  . Warfarin - Pharmacist Dosing Inpatient   Does not apply q1800     Infusions: . heparin 1,050 Units/hr (06/25/17 0700)  . lactated ringers Stopped (06/14/17 1400)  . lactated ringers Stopped (06/15/17 1901)     PRN Medications:  heparin, heparin, hydrALAZINE, LORazepam, ondansetron (ZOFRAN) IV, oxyCODONE, promethazine, sodium chloride flush, sodium chloride flush, traMADol   Assessment:   He has been hemodynamically stable although MAP too high. Oral hydralazine started this am.  AKI/CKD with anuria. HD catheter is out and plan tunneled catheter tomorrow.  Continue heparin pending HD catheter.   Should remove pacing wires tomorrow while off anticoagulation for catheter.  Plan/Discussion:    Plan tunneled HD catheter tomorrow.  I reviewed the LVAD parameters from today, and compared the results to the patient's prior recorded data.  No programming changes were made.  The LVAD is functioning within specified parameters.  The patient performs LVAD self-test daily.  LVAD interrogation was negative for any significant power changes, alarms or PI events/speed drops.  LVAD equipment check completed and is in good working order.  Back-up  equipment present.   LVAD education done on emergency procedures and precautions and reviewed exit site care.  Length of Stay: [address]  Fernande Boyden [facility] 06/25/2017, 12:08 PM

## 2017-06-25 NOTE — Progress Notes (Signed)
Observed Pt daughter complete daily LVAD sterile dressing change with appropriate technique.

## 2017-06-25 NOTE — Plan of Care (Signed)
Problem: Cardiac: Goal: Ability to maintain an adequate cardiac output will improve Outcome: Progressing Maintaining ordered parameters on current LVAD settings, requiring PRN hydralazine for MAP > 95.  Problem: Fluid Volume: Goal: Risk for excess fluid volume will decrease Outcome: Progressing Pt tolerating transitioning to TTS H/D without hemodynamic instability., now preparing to have tunneled HD cath and AV fistula placed.

## 2017-06-25 NOTE — Progress Notes (Signed)
Patient ID: [name], male   DOB: June 14, 1966, 51 y.o.   MRN: [id number]   Advanced Heart Failure VAD Team Note  Subjective:    Admitted 10/3 for RHC/Swan implantation and optimization prior to LVAD.  HeartMate 3 VAD implanted 10/8.  CVVH begun 10/11 CVVH stopped 10/15 Started iHD 10/17 (now TTS)   Remains off milrinone for 2 days. Feels fine. Central line pulled. No co-ox today. Had HD yesterday. Denies SOB.  Weight stable. Received IV hydralazine over night several times for high MAPs.    LVAD INTERROGATION:  HeartMate 3:  Flow 4.1 liters/min, speed 5400 rpm, power 4.0, PI 3.8 .   No PI events.    Objective:    Vital Signs:   Temp:  [97.7 F (36.5 C)-98.5 F (36.9 C)] 98 F (36.7 C) (10/21 0804) Pulse Rate:  [33-98] 95 (10/21 0900) Resp:  [13-29] 24 (10/21 0900) BP: (89-131)/(68-94) 111/72 (10/21 0900) SpO2:  [85 %-96 %] 94 % (10/21 0900) Weight:  [93.9 kg (206 lb 15.8 oz)-95.8 kg (211 lb 3.2 oz)] 93.9 kg (207 lb 0.2 oz) (10/21 0500) Last BM Date: 06/25/17 Mean arterial Pressure variable, 80s-97.   Intake/Output:   Intake/Output Summary (Last 24 hours) at 06/25/17 1042 Last data filed at 06/25/17 0900  Gross per 24 hour  Intake           1141.5 ml  Output             2000 ml  Net           -858.5 ml     Physical Exam: General:  Sitting in chair NAD.  HEENT: normal  Neck: supple. JVP not elevated. RIJ catheter removed. Site ok  Carotids 2+ bilat; no bruits. No lymphadenopathy or thryomegaly appreciated. Cor: LVAD hum.  Lungs: Clear. Abdomen:  soft, nontender, non-distended. No hepatosplenomegaly. No bruits or masses. Good bowel sounds. Driveline site clean. Anchor in place.  Extremities: no cyanosis, clubbing, rash. Warm no edema  Neuro: alert & oriented x 3. No focal deficits. Moves all 4 without problem       Telemetry   NSR 90s Personally reviewed   Labs   Basic Metabolic Panel:  Recent Labs Lab 06/20/17 0351 06/21/17 0215 06/22/17 0219  06/22/17 1649 06/23/17 0300 06/24/17 0316 06/25/17 0349  NA 129* 127* 127* 134* 131* 128* 133*  K 3.7 3.9 4.0 4.0 4.0 4.1 4.2  CL 93* 91* 92* 97* 96* 92* 97*  CO2 23 23 24 28 27 24 22   GLUCOSE 144* 148* 174* 102* 87 185* 187*  BUN 20 17 29* 9 16 33* 27*  CREATININE 4.52* 4.04* 6.17* 3.16* 4.51* 6.63* 5.56*  CALCIUM 7.8* 7.7* 8.0* 7.9* 7.9* 8.2* 8.3*  MG 2.2 1.9 2.0  --  1.9 2.1 1.8  PHOS 2.6 1.8* 3.4 1.6*  --   --  3.5    Liver Function Tests:  Recent Labs Lab 06/20/17 0351 06/21/17 0215 06/22/17 0219 06/22/17 1649 06/23/17 0300 06/24/17 0316 06/25/17 0349  AST 25 29 24   --  27 24  --   ALT 15* 17 15*  --  16* 16*  --   ALKPHOS 113 124 115  --  124 136*  --   BILITOT 1.0 1.0 0.7  --  0.6 0.8  --   PROT 6.2* 6.2* 6.3*  --  6.3* 7.0  --   ALBUMIN 2.2* 2.2* 2.1* 2.2* 2.2* 2.3* 2.3*   No results for input(s): LIPASE, AMYLASE in the last 168  hours. No results for input(s): AMMONIA in the last 168 hours.  CBC:  Recent Labs Lab 06/19/17 0326  06/21/17 0215 06/22/17 0219 06/22/17 1650 06/23/17 0300 06/24/17 0316  WBC 14.1*  < > 17.6* 16.0* 16.6* 16.1* 16.8*  NEUTROABS 10.6*  --   --   --   --   --   --   HGB 8.6*  < > 8.0* 7.7* 9.3* 8.9* 9.3*  HCT 25.0*  < > 24.3* 22.8* 28.1* 26.7* 28.0*  MCV 79.4  < > 78.9 78.9 79.6 80.4 80.7  PLT 275  < > 336 354 391 406* 446*  < > = values in this interval not displayed.  INR:  Recent Labs Lab 06/21/17 0215 06/22/17 0219 06/23/17 0300 06/24/17 0316 06/25/17 0349  INR 2.13 1.72 1.89 1.69 1.46    Other results:   Imaging   Dg Chest Port 1 View  Result Date: 06/24/2017 CLINICAL DATA:  Follow-up LVAD EXAM: PORTABLE CHEST 1 VIEW COMPARISON:  06/23/2017 FINDINGS: Cardiac shadow is enlarged. Left ventricular assist device is again seen and stable. Defibrillator and right jugular dialysis catheter are seen. The small bore right subclavian central line has been removed in the interval. The overall inspiratory effort is  again poor with stable changes in the left base. No new focal abnormality is noted. IMPRESSION: No significant interval change from the prior exam aside from removal of right subclavian central line. Electronically Signed   By: Inez Catalina M.D.   On: 06/24/2017 07:01     Medications:     Scheduled Medications: . amiodarone  400 mg Oral BID  . aspirin EC  325 mg Oral Daily   Or  . aspirin  324 mg Per Tube Daily   Or  . aspirin  300 mg Rectal Daily  . atorvastatin  80 mg Oral q1800  . bisacodyl  10 mg Oral Daily   Or  . bisacodyl  10 mg Rectal Daily  . Chlorhexidine Gluconate Cloth  6 each Topical Daily  . darbepoetin (ARANESP) injection - DIALYSIS  100 mcg Intravenous Q Sat-HD  . docusate sodium  200 mg Oral Daily  . feeding supplement (GLUCERNA SHAKE)  237 mL Oral TID BM  . feeding supplement (PRO-STAT SUGAR FREE 64)  30 mL Oral TID WC  . insulin aspart  0-24 Units Subcutaneous Q4H  . mupirocin ointment   Nasal BID  . pantoprazole  40 mg Oral Daily  . sildenafil  20 mg Oral TID  . sodium chloride flush  10-40 mL Intracatheter Q12H  . sodium chloride flush  3 mL Intravenous Q12H  . Warfarin - Pharmacist Dosing Inpatient   Does not apply q1800    Infusions: . heparin 1,050 Units/hr (06/25/17 0700)  . lactated ringers Stopped (06/14/17 1400)  . lactated ringers Stopped (06/15/17 1901)    PRN Medications: heparin, heparin, hydrALAZINE, LORazepam, ondansetron (ZOFRAN) IV, oxyCODONE, promethazine, sodium chloride flush, sodium chloride flush, traMADol   Patient Profile   51 yo with history of CAD s/p anterior MI in 2010 and chronic systolic CHF (ischemic cardiomyopathy) as well as CKD stage III now s/p HeartMate 3 VAD placement.   Assessment/Plan:    1. Acute on chronic systolic CHF: Ischemic cardiomyopathy. St Jude ICD. NSTEMI in March 2018 with DES to LAD and RCA, complicated by cardiogenic shock, low output requiring milrinone. Most recent echo 8/18 with EF 20-25%,  moderate MR. He was admitted and started on milrinone with improved creatinine.  HeartMate 3 VAD placement on  10/8. Speed cut back to 5400 with onset of afib/RVR 10/11.    - Milrinone stopped 10/19. No central line to check co-ox. Will continue sildenafil for RV support.   - Holding warfarin for now and covering  with heparin pending permanent dialysis access placement tomorrow (Dr. Bridgett Larsson).  - INR 1.46, INR Goal 2-3 (aim slightly higher with h/o Factor V Leiden heterozygosity) and ASA 325. Will not lower aspirin dose with Factor V Leiden. D/w PharmD  - MAPs slightly elevated. Will start hydralazine 12.5 tid   - Will eventually need ramp echo before discharge (aim for Monday or Tuesday) - Continue to ambulate. Continue VAD education  - VAD parameters reviewed personally and are stable  2. AKI on CKD:  - Seen by Dr. Marval Regal. Still no renal recovery.  Minimal UOP. Has had HD x 3, now on TTS schedule. Tolerated HD well yesterday. Pending permanent access tomorrow.  3. CAD: NSTEMI in 3/18. LHC with 99% ulcerated lesion proximal RCA with left to right collaterals, 95% mid LAD stenosis after mid LAD stent. s/p PCI to RCA and LAD on 11/25/16.  - no s/s ischemia  - Off plavix for LVAD.   - Continue statin.   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 Recent V/Q scan did not show acute or chronic PE.  He is a heterozygote for Factor V Leiden, plan for for ASA 325 and warfarin INR 2-3 with LVAD.  5. Chronic N/V/D with gastroparesis:  -Tolerating diet.  Has occasional nausea.    6. DM: - On Jardiance at home, will cover with sliding scale. Glucose stable.   7. S/P Multiple Tooth Extractions 4-15, 19-23, and 27-29.  8. Atrial fibrillation:  -Paroxysmal. In NSR.  Continue amio 400 mg twice a day. Can likely decrease tomorrow. 9. ID: CXR with LLL atelectasis versus infiltrate. Afebrile.  - He is off vancomycin, zosyn stopped 10/20 10. Hyponatremia: Sodium 133 today. Continue to follow. Free water  restrict.  11. Anemia: No CBC today. Has been stable    I reviewed the LVAD parameters from today, and compared the results to the patient's prior recorded data.  No programming changes were made.  The LVAD is functioning within specified parameters.  The patient performs LVAD self-test daily.  LVAD interrogation was negative for any significant power changes, alarms or PI events/speed drops.  LVAD equipment check completed and is in good working order.  Back-up equipment present.   LVAD education done on emergency procedures and precautions and reviewed exit site care.  [name] MD  06/25/2017 10:42 AM

## 2017-06-25 NOTE — Progress Notes (Signed)
[place] for Coumadin/Heparin Indication: LVAD, h/o DVT Factor V Leiden heterozygosity  Allergies  Allergen Reactions  . Metformin And Related Diarrhea    Patient Measurements: Height: 5' 8"  (172.7 cm) Weight: 207 lb 0.2 oz (93.9 kg) IBW/kg (Calculated) : 68.4   Vital Signs: Temp: 98.1 F (36.7 C) (10/21 0400) Temp Source: Axillary (10/21 0400) BP: 113/72 (10/21 0700) Pulse Rate: 92 (10/21 0700)  Labs:  Recent Labs  06/22/17 1650  06/23/17 0300 06/23/17 0848 06/24/17 0316 06/25/17 0349  HGB 9.3*  --  8.9*  --  9.3*  --   HCT 28.1*  --  26.7*  --  28.0*  --   PLT 391  --  406*  --  446*  --   LABPROT  --   --  21.5*  --  19.7* 17.6*  INR  --   --  1.89  --  1.69 1.46  HEPARINUNFRC  --   < > 0.31 0.30 0.36 0.53  CREATININE  --   --  4.51*  --  6.63* 5.56*  < > = values in this interval not displayed.  Estimated Creatinine Clearance: 17.3 mL/min (A) (by C-G formula based on SCr of 5.56 mg/dL (H)).   Medical History: Past Medical History:  Diagnosis Date  . Angina   . ASCVD (arteriosclerotic cardiovascular disease)    , Anterior infarction 2005, LAD diagonal bifurcation intervention 03/2004  . Automatic implantable cardiac defibrillator -St. Jude's       . Benign neoplasm of colon   . CHF (congestive heart failure) ([place])   . Chronic systolic heart failure ([place])   . Coronary artery disease     Widely patent previously placed stents in the left anterior   . Crohn's disease ([place])   . 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 Dyspnea   . Gastroparesis   . GERD (gastroesophageal reflux disease)   . High cholesterol   . Hyperlipidemia   . Hypersomnolent    Previous diagnosis of narcolepsy  . Hypertension, essential   . Ischemic cardiomyopathy    Ejection fraction 15-20% catheterization 2010  . Type II or unspecified type diabetes mellitus without mention of complication, not stated as uncontrolled   .  Unspecified gastritis and gastroduodenitis without mention of hemorrhage     Assessment: 36 yoM on chronic Coumadin for Factor 5 Leiden and hx DVTs admitted for LVAD placement. Heparin started on 10/19 with subtherapeutic INR, now planning to hold warfarin over the weekend and cover with heparin as pt will need vascular access placed Monday for HD.   INR continues to trend downward from 1.69 to 1.46, with last dose of warfarin on 10/18. CBC stable yesterday- order placed for tomorrow. LDH increased slightly from yesterday, now at 316. No infusion issues per nursing. No signs/symptoms of bleeding. Heparin level came back within goal range at 0.53 on heparin 1050 units/hr.   Goal of Therapy:  INR 2-3  Heparin Level 0.3-0.7 Monitor platelets by anticoagulation protocol: Yes   Plan:  -Hold Coumadin -Continue heparin at 1050 units/hr -Monitor heparin level,CBC, INR daily -Follow up with anticoagulation plan following vascular access on Monday   Doylene Canard, PharmD Clinical Pharmacist  Phone: [phone] 06/25/2017

## 2017-06-26 ENCOUNTER — Inpatient Hospital Stay (HOSPITAL_COMMUNITY): Payer: BLUE CROSS/BLUE SHIELD

## 2017-06-26 ENCOUNTER — Encounter (HOSPITAL_COMMUNITY): Payer: BLUE CROSS/BLUE SHIELD

## 2017-06-26 ENCOUNTER — Inpatient Hospital Stay (HOSPITAL_COMMUNITY): Payer: BLUE CROSS/BLUE SHIELD | Admitting: Certified Registered Nurse Anesthetist

## 2017-06-26 ENCOUNTER — Encounter (HOSPITAL_COMMUNITY)
Admission: AD | Disposition: A | Discharge status: Home Health | Payer: Self-pay | Source: Ambulatory Visit | Attending: Cardiology

## 2017-06-26 ENCOUNTER — Encounter (HOSPITAL_COMMUNITY): Payer: Self-pay | Admitting: Surgery

## 2017-06-26 HISTORY — PX: INSERTION OF DIALYSIS CATHETER: SHX1324

## 2017-06-26 HISTORY — PX: AV FISTULA PLACEMENT: SHX1204

## 2017-06-26 LAB — BASIC METABOLIC PANEL
Anion gap: 9 (ref 5–15)
BUN: 38 mg/dL — ABNORMAL HIGH (ref 6–20)
CHLORIDE: 96 mmol/L — AB (ref 101–111)
CO2: 24 mmol/L (ref 22–32)
CREATININE: 7.52 mg/dL — AB (ref 0.61–1.24)
Calcium: 8.4 mg/dL — ABNORMAL LOW (ref 8.9–10.3)
GFR calc Af Amer: 9 mL/min — ABNORMAL LOW (ref >60)
GFR calc non Af Amer: 7 mL/min — ABNORMAL LOW (ref >60)
Glucose, Bld: 149 mg/dL — ABNORMAL HIGH (ref 65–99)
Potassium: 3.7 mmol/L (ref 3.5–5.1)
SODIUM: 129 mmol/L — AB (ref 135–145)

## 2017-06-26 LAB — RENAL FUNCTION PANEL
ALBUMIN: 2.3 g/dL — AB (ref 3.5–5.0)
Anion gap: 9 (ref 5–15)
BUN: 39 mg/dL — ABNORMAL HIGH (ref 6–20)
CALCIUM: 8.4 mg/dL — AB (ref 8.9–10.3)
CHLORIDE: 97 mmol/L — AB (ref 101–111)
CO2: 24 mmol/L (ref 22–32)
Creatinine, Ser: 7.5 mg/dL — ABNORMAL HIGH (ref 0.61–1.24)
GFR, EST AFRICAN AMERICAN: 9 mL/min — AB (ref >60)
GFR, EST NON AFRICAN AMERICAN: 7 mL/min — AB (ref >60)
GLUCOSE: 150 mg/dL — AB (ref 65–99)
PHOSPHORUS: 3.6 mg/dL (ref 2.5–4.6)
Potassium: 3.7 mmol/L (ref 3.5–5.1)
SODIUM: 130 mmol/L — AB (ref 135–145)

## 2017-06-26 LAB — GLUCOSE, CAPILLARY
GLUCOSE-CAPILLARY: 170 mg/dL — AB (ref 65–99)
GLUCOSE-CAPILLARY: 162 mg/dL — AB (ref 65–99)
GLUCOSE-CAPILLARY: 155 mg/dL — AB (ref 65–99)
Glucose-Capillary: 159 mg/dL — ABNORMAL HIGH (ref 65–99)
Glucose-Capillary: 183 mg/dL — ABNORMAL HIGH (ref 65–99)
Glucose-Capillary: 231 mg/dL — ABNORMAL HIGH (ref 65–99)

## 2017-06-26 LAB — CBC
HCT: 29.2 % — ABNORMAL LOW (ref 39.0–52.0)
Hemoglobin: 9.7 g/dL — ABNORMAL LOW (ref 13.0–17.0)
MCH: 26.7 pg (ref 26.0–34.0)
MCHC: 33.2 g/dL (ref 30.0–36.0)
MCV: 80.4 fL (ref 78.0–100.0)
PLATELETS: 475 10*3/uL — AB (ref 150–400)
RBC: 3.63 MIL/uL — ABNORMAL LOW (ref 4.22–5.81)
RDW: 17.8 % — AB (ref 11.5–15.5)
WBC: 17.8 10*3/uL — AB (ref 4.0–10.5)

## 2017-06-26 LAB — MAGNESIUM: Magnesium: 2.1 mg/dL (ref 1.7–2.4)

## 2017-06-26 LAB — BRAIN NATRIURETIC PEPTIDE: B Natriuretic Peptide: 1409.3 pg/mL — ABNORMAL HIGH (ref 0.0–100.0)

## 2017-06-26 LAB — HEPARIN LEVEL (UNFRACTIONATED): Heparin Unfractionated: 0.39 IU/mL (ref 0.30–0.70)

## 2017-06-26 LAB — PROTIME-INR
INR: 1.39
Prothrombin Time: 17 seconds — ABNORMAL HIGH (ref 11.4–15.2)

## 2017-06-26 LAB — LACTATE DEHYDROGENASE: LDH: 257 U/L — ABNORMAL HIGH (ref 98–192)

## 2017-06-26 SURGERY — INSERTION OF DIALYSIS CATHETER
Anesthesia: Monitor Anesthesia Care | Laterality: Right

## 2017-06-26 MED ORDER — PHENYLEPHRINE HCL 10 MG/ML IJ SOLN
INTRAVENOUS | Status: DC | PRN
  Administered 2017-06-26: 10 ug/min via INTRAVENOUS

## 2017-06-26 MED ORDER — WARFARIN SODIUM 7.5 MG PO TABS
7.5000 mg | ORAL_TABLET | Freq: Once | ORAL | Status: AC
  Administered 2017-06-26: 7.5 mg via ORAL
  Filled 2017-06-26: qty 1

## 2017-06-26 MED ORDER — CEFAZOLIN SODIUM-DEXTROSE 2-3 GM-%(50ML) IV SOLR
INTRAVENOUS | Status: DC | PRN
  Administered 2017-06-26: 2 g via INTRAVENOUS

## 2017-06-26 MED ORDER — 0.9 % SODIUM CHLORIDE (POUR BTL) OPTIME
TOPICAL | Status: DC | PRN
  Administered 2017-06-26: 1000 mL

## 2017-06-26 MED ORDER — LIDOCAINE HCL (CARDIAC) 20 MG/ML IV SOLN
INTRAVENOUS | Status: DC | PRN
  Administered 2017-06-26: 40 mg via INTRATRACHEAL

## 2017-06-26 MED ORDER — FENTANYL CITRATE (PF) 100 MCG/2ML IJ SOLN
INTRAMUSCULAR | Status: DC | PRN
  Administered 2017-06-26: 25 ug via INTRAVENOUS

## 2017-06-26 MED ORDER — EPHEDRINE SULFATE 50 MG/ML IJ SOLN
INTRAMUSCULAR | Status: DC | PRN
  Administered 2017-06-26: 5 mg via INTRAVENOUS

## 2017-06-26 MED ORDER — LIDOCAINE HCL (PF) 1 % IJ SOLN
INTRAMUSCULAR | Status: AC
  Filled 2017-06-26: qty 60

## 2017-06-26 MED ORDER — HEPARIN SODIUM (PORCINE) 1000 UNIT/ML IJ SOLN
INTRAMUSCULAR | Status: DC | PRN
  Administered 2017-06-26: 3.4 [IU]

## 2017-06-26 MED ORDER — AMIODARONE HCL 200 MG PO TABS
200.0000 mg | ORAL_TABLET | Freq: Two times a day (BID) | ORAL | Status: DC
Start: 2017-06-26 — End: 2017-07-01
  Administered 2017-06-26 – 2017-06-30 (×9): 200 mg via ORAL
  Filled 2017-06-26 (×10): qty 1

## 2017-06-26 MED ORDER — HEPARIN SODIUM (PORCINE) 1000 UNIT/ML IJ SOLN
INTRAMUSCULAR | Status: AC
  Filled 2017-06-26: qty 1

## 2017-06-26 MED ORDER — HEPARIN (PORCINE) IN NACL 100-0.45 UNIT/ML-% IJ SOLN
1200.0000 [IU]/h | INTRAMUSCULAR | Status: DC
  Filled 2017-06-26 (×2): qty 250

## 2017-06-26 MED ORDER — HEMOSTATIC AGENTS (NO CHARGE) OPTIME
TOPICAL | Status: DC | PRN
  Administered 2017-06-26: 1 via TOPICAL

## 2017-06-26 MED ORDER — FENTANYL CITRATE (PF) 250 MCG/5ML IJ SOLN
INTRAMUSCULAR | Status: AC
  Filled 2017-06-26: qty 5

## 2017-06-26 MED ORDER — HEPARIN (PORCINE) IN NACL 100-0.45 UNIT/ML-% IJ SOLN
1050.0000 [IU]/h | INTRAMUSCULAR | Status: DC
  Filled 2017-06-26: qty 250

## 2017-06-26 MED ORDER — LIDOCAINE HCL (PF) 1 % IJ SOLN
INTRAMUSCULAR | Status: DC | PRN
  Administered 2017-06-26: 20 mL

## 2017-06-26 MED ORDER — RENA-VITE PO TABS
1.0000 | ORAL_TABLET | Freq: Every day | ORAL | Status: DC
  Administered 2017-06-26 – 2017-07-02 (×7): 1 via ORAL
  Filled 2017-06-26 (×7): qty 1

## 2017-06-26 MED ORDER — PROPOFOL 500 MG/50ML IV EMUL
INTRAVENOUS | Status: DC | PRN
  Administered 2017-06-26: 75 ug/kg/min via INTRAVENOUS

## 2017-06-26 MED ORDER — MIDAZOLAM HCL 2 MG/2ML IJ SOLN
INTRAMUSCULAR | Status: AC
  Filled 2017-06-26: qty 2

## 2017-06-26 MED ORDER — SODIUM CHLORIDE 0.9 % IV SOLN
INTRAVENOUS | Status: DC | PRN
  Administered 2017-06-26: 15:00:00

## 2017-06-26 MED ORDER — PROPOFOL 10 MG/ML IV BOLUS
INTRAVENOUS | Status: AC
  Filled 2017-06-26: qty 20

## 2017-06-26 MED ORDER — SODIUM CHLORIDE 0.9 % IV SOLN
INTRAVENOUS | Status: DC | PRN
  Administered 2017-06-26: 14:00:00 via INTRAVENOUS

## 2017-06-26 MED ORDER — MIDAZOLAM HCL 5 MG/5ML IJ SOLN
INTRAMUSCULAR | Status: DC | PRN
  Administered 2017-06-26: 2 mg via INTRAVENOUS

## 2017-06-26 SURGICAL SUPPLY — 62 items
ADH SKN CLS APL DERMABOND .7 (GAUZE/BANDAGES/DRESSINGS) ×2
AGENT HMST SPONGE THK3/8 (HEMOSTASIS) ×1
ARMBAND PINK RESTRICT EXTREMIT (MISCELLANEOUS) ×3 IMPLANT
BAG DECANTER FOR FLEXI CONT (MISCELLANEOUS) ×1 IMPLANT
BIOPATCH RED 1 DISK 7.0 (GAUZE/BANDAGES/DRESSINGS) ×2 IMPLANT
CANISTER SUCT 3000ML PPV (MISCELLANEOUS) ×2 IMPLANT
CATH PALINDROME RT-P 15FX19CM (CATHETERS) IMPLANT
CATH PALINDROME RT-P 15FX23CM (CATHETERS) ×1 IMPLANT
CATH PALINDROME RT-P 15FX28CM (CATHETERS) IMPLANT
CATH PALINDROME RT-P 15FX55CM (CATHETERS) IMPLANT
CATH STRAIGHT 5FR 65CM (CATHETERS) IMPLANT
CLIP VESOCCLUDE MED 6/CT (CLIP) ×2 IMPLANT
CLIP VESOCCLUDE SM WIDE 6/CT (CLIP) ×2 IMPLANT
COVER PROBE W GEL 5X96 (DRAPES) ×3 IMPLANT
COVER SURGICAL LIGHT HANDLE (MISCELLANEOUS) ×3 IMPLANT
DECANTER SPIKE VIAL GLASS SM (MISCELLANEOUS) ×2 IMPLANT
DERMABOND ADVANCED (GAUZE/BANDAGES/DRESSINGS) ×2
DERMABOND ADVANCED .7 DNX12 (GAUZE/BANDAGES/DRESSINGS) ×1 IMPLANT
DRAPE C-ARM 42X72 X-RAY (DRAPES) ×2 IMPLANT
DRAPE CHEST BREAST 15X10 FENES (DRAPES) ×2 IMPLANT
ELECT REM PT RETURN 9FT ADLT (ELECTROSURGICAL) ×2
ELECTRODE REM PT RTRN 9FT ADLT (ELECTROSURGICAL) ×1 IMPLANT
GAUZE SPONGE 4X4 16PLY XRAY LF (GAUZE/BANDAGES/DRESSINGS) ×2 IMPLANT
GLOVE BIO SURGEON STRL SZ7 (GLOVE) ×3 IMPLANT
GLOVE BIOGEL PI IND STRL 6.5 (GLOVE) IMPLANT
GLOVE BIOGEL PI IND STRL 7.5 (GLOVE) ×1 IMPLANT
GLOVE BIOGEL PI IND STRL 8.5 (GLOVE) IMPLANT
GLOVE BIOGEL PI INDICATOR 6.5 (GLOVE) ×6
GLOVE BIOGEL PI INDICATOR 7.5 (GLOVE) ×2
GLOVE BIOGEL PI INDICATOR 8.5 (GLOVE) ×1
GOWN STRL REUS W/ TWL LRG LVL3 (GOWN DISPOSABLE) ×3 IMPLANT
GOWN STRL REUS W/ TWL XL LVL3 (GOWN DISPOSABLE) IMPLANT
GOWN STRL REUS W/TWL LRG LVL3 (GOWN DISPOSABLE) ×10
GOWN STRL REUS W/TWL XL LVL3 (GOWN DISPOSABLE) ×2
HEMOSTAT SPONGE AVITENE ULTRA (HEMOSTASIS) ×1 IMPLANT
KIT BASIN OR (CUSTOM PROCEDURE TRAY) ×2 IMPLANT
KIT ROOM TURNOVER OR (KITS) ×2 IMPLANT
NDL 18GX1X1/2 (RX/OR ONLY) (NEEDLE) ×1 IMPLANT
NDL HYPO 25GX1X1/2 BEV (NEEDLE) ×1 IMPLANT
NEEDLE 18GX1X1/2 (RX/OR ONLY) (NEEDLE) ×2 IMPLANT
NEEDLE HYPO 25GX1X1/2 BEV (NEEDLE) IMPLANT
NS IRRIG 1000ML POUR BTL (IV SOLUTION) ×2 IMPLANT
PACK CV ACCESS (CUSTOM PROCEDURE TRAY) ×2 IMPLANT
PACK SURGICAL SETUP 50X90 (CUSTOM PROCEDURE TRAY) ×2 IMPLANT
PAD ARMBOARD 7.5X6 YLW CONV (MISCELLANEOUS) ×4 IMPLANT
SET MICROPUNCTURE 5F STIFF (MISCELLANEOUS) IMPLANT
SOAP 2 % CHG 4 OZ (WOUND CARE) ×2 IMPLANT
SUT ETHILON 3 0 PS 1 (SUTURE) ×2 IMPLANT
SUT MNCRL AB 4-0 PS2 18 (SUTURE) ×2 IMPLANT
SUT PROLENE 6 0 BV (SUTURE) ×1 IMPLANT
SUT PROLENE 7 0 BV 1 (SUTURE) ×2 IMPLANT
SUT VIC AB 3-0 SH 27 (SUTURE) ×2
SUT VIC AB 3-0 SH 27X BRD (SUTURE) ×1 IMPLANT
SYR 10ML LL (SYRINGE) ×2 IMPLANT
SYR 20CC LL (SYRINGE) ×3 IMPLANT
SYR 3ML LL SCALE MARK (SYRINGE) ×2 IMPLANT
SYR 5ML LL (SYRINGE) ×2 IMPLANT
SYR CONTROL 10ML LL (SYRINGE) ×1 IMPLANT
TOWEL GREEN STERILE FF (TOWEL DISPOSABLE) ×2 IMPLANT
UNDERPAD 30X30 (UNDERPADS AND DIAPERS) ×2 IMPLANT
WATER STERILE IRR 1000ML POUR (IV SOLUTION) ×2 IMPLANT
WIRE AMPLATZ SS-J .035X180CM (WIRE) IMPLANT

## 2017-06-26 NOTE — Plan of Care (Signed)
Problem: Cardiac: Goal: Ability to maintain an adequate cardiac output will improve Outcome: Progressing LVAD parameters remain stable without function alarms on ordered speed.  Problem: Education: Goal: Knowledge of the prescribed therapeutic regimen will improve Outcome: Progressing Pt demonstrates ability to correctly change from wall power to batteries and back. Also educating on back-up power self check. Daughter correctly demonstrates ability to perform drive line dressing changes under direct supervision.

## 2017-06-26 NOTE — Progress Notes (Addendum)
[place] for Coumadin/Heparin Indication: LVAD, h/o DVT, Factor V Leiden heterozygosity  Allergies  Allergen Reactions  . Metformin And Related Diarrhea    Patient Measurements: Height: 5' 8"  (172.7 cm) Weight: 204 lb 9.4 oz (92.8 kg) IBW/kg (Calculated) : 68.4   Vital Signs: Temp: 98 F (36.7 C) (10/22 0822) Temp Source: Oral (10/22 0822) BP: 106/77 (10/22 1100) Pulse Rate: 86 (10/22 1100)  Labs:  Recent Labs  06/24/17 0316 06/25/17 0349 06/26/17 0305  HGB 9.3*  --  9.7*  HCT 28.0*  --  29.2*  PLT 446*  --  475*  LABPROT 19.7* 17.6* 17.0*  INR 1.69 1.46 1.39  HEPARINUNFRC 0.36 0.53 0.39  CREATININE 6.63* 5.56* 7.52*  7.50*    Estimated Creatinine Clearance: 12.7 mL/min (A) (by C-G formula based on SCr of 7.5 mg/dL (H)).   Medical History: Past Medical History:  Diagnosis Date  . Angina   . ASCVD (arteriosclerotic cardiovascular disease)    , Anterior infarction 2005, LAD diagonal bifurcation intervention 03/2004  . Automatic implantable cardiac defibrillator -St. Jude's       . Benign neoplasm of colon   . CHF (congestive heart failure) ([place])   . Chronic systolic heart failure ([place])   . Coronary artery disease     Widely patent previously placed stents in the left anterior   . Crohn's disease ([place])   . 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 Dyspnea   . Gastroparesis   . GERD (gastroesophageal reflux disease)   . High cholesterol   . Hyperlipidemia   . Hypersomnolent    Previous diagnosis of narcolepsy  . Hypertension, essential   . Ischemic cardiomyopathy    Ejection fraction 15-20% catheterization 2010  . Type II or unspecified type diabetes mellitus without mention of complication, not stated as uncontrolled   . Unspecified gastritis and gastroduodenitis without mention of hemorrhage     Assessment: 21 yoM on chronic Coumadin for Factor 5 Leiden and hx DVTs admitted for LVAD placement.  Heparin started on 10/19 with subtherapeutic INR, now planning to hold warfarin over the weekend and cover with heparin as pt will need vascular access placed Monday for HD.   INR remains subtherapeutic at 1.39 with last dose of warfarin on 10/18. CBC and LDH stable, heparin level therapeutic at 0.39. To resume Coumadin tonight after access placement.  Spoke to Dr. Bridgett Larsson, per his report, Heparin infusion was running during placement of tunneled dialysis catheter placement.  Spoke to Dr. Prescott Gum - keep heparin running for now.  OK to restart Coumadin tonight.  Goal of Therapy:  INR 2-3  Heparin Level 0.3-0.7 Monitor platelets by anticoagulation protocol: Yes   Plan:  -Daily heparin level and CBC.  Uvaldo Rising, BCPS  Clinical Pharmacist Pager [phone]  06/26/2017 3:42 PM

## 2017-06-26 NOTE — Progress Notes (Signed)
CSW attempted to visit patient although patient in the OR for fistula placement. CSW met with patient's friend who stated patient doing well and hopeful to go home soon. CSW will follow up with patient tomorrow for supportive intervention. Raquel Sarna, [place], [place]

## 2017-06-26 NOTE — Anesthesia Procedure Notes (Signed)
Procedure Name: MAC Date/Time: 06/26/2017 1:52 PM Performed by: Oletta Lamas Pre-anesthesia Checklist: Patient identified, Emergency Drugs available, Suction available and Patient being monitored Patient Re-evaluated:Patient Re-evaluated prior to induction Oxygen Delivery Method: Simple face mask

## 2017-06-26 NOTE — Progress Notes (Signed)
Patient ID: [name], male   DOB: 1965-12-02, 51 y.o.   MRN: [id number]   Advanced Heart Failure VAD Team Note  Subjective:    Admitted 10/3 for RHC/Swan implantation and optimization prior to LVAD.  HeartMate 3 VAD implanted 10/8.  CVVH begun 10/11 CVVH stopped 10/15 Started iHD 10/17 (now TTS)  Feeling OK. Denies SOB, though takes breaths between conversation. He states it has been "harder to talk" since getting his teeth out, and has been hard to take a deep breath since surgery.  Denies dizziness or lightheadedness. No UO.   Creatinine up to 7.5 this am. Per Neprhology note will need permanent access.   LVAD INTERROGATION:  HeartMate 3:  Flow 4.0 liters/min, speed 5400 rpm, power 4.0, PI 3.8. No PI events.   Objective:    Vital Signs:   Temp:  [97.5 F (36.4 C)-99.1 F (37.3 C)] 97.5 F (36.4 C) (10/22 0300) Pulse Rate:  [62-98] 86 (10/22 0700) Resp:  [11-29] 28 (10/22 0700) BP: (90-131)/(68-99) 113/86 (10/22 0700) SpO2:  [92 %-98 %] 96 % (10/22 0700) Weight:  [204 lb 9.4 oz (92.8 kg)] 204 lb 9.4 oz (92.8 kg) (10/22 0600) Last BM Date: 06/25/17 Mean arterial Pressure variable, 80s-97.   Intake/Output:   Intake/Output Summary (Last 24 hours) at 06/26/17 0722 Last data filed at 06/26/17 0700  Gross per 24 hour  Intake             1452 ml  Output              100 ml  Net             1352 ml     Physical Exam: GENERAL: NAD  HEENT: Normal. NECK: Supple, JVP 7-8 cm. Carotids OK.  CARDIAC:  Mechanical heart sounds with LVAD hum present.  LUNGS:  CTAB, normal effort.  ABDOMEN:  NT, ND, no HSM. No bruits or masses. +BS  LVAD exit site: Driveline site clean. Anchor in place.  EXTREMITIES:  Warm and dry. No cyanosis, clubbing, rash, or edema.  NEUROLOGIC:  Alert & oriented x 3. Cranial nerves grossly intact. Moves all 4 extremities w/o difficulty. Affect pleasant      Telemetry   NSR 90s, Personally reviewed.   Labs   Basic Metabolic Panel:  Recent  Labs Lab 06/21/17 0215 06/22/17 0219 06/22/17 1649 06/23/17 0300 06/24/17 0316 06/25/17 0349 06/26/17 0305  NA 127* 127* 134* 131* 128* 133* 129*  130*  K 3.9 4.0 4.0 4.0 4.1 4.2 3.7  3.7  CL 91* 92* 97* 96* 92* 97* 96*  97*  CO2 23 24 28 27 24 22 24  24   GLUCOSE 148* 174* 102* 87 185* 187* 149*  150*  BUN 17 29* 9 16 33* 27* 38*  39*  CREATININE 4.04* 6.17* 3.16* 4.51* 6.63* 5.56* 7.52*  7.50*  CALCIUM 7.7* 8.0* 7.9* 7.9* 8.2* 8.3* 8.4*  8.4*  MG 1.9 2.0  --  1.9 2.1 1.8 2.1  PHOS 1.8* 3.4 1.6*  --   --  3.5 3.6   Liver Function Tests:  Recent Labs Lab 06/20/17 0351 06/21/17 0215 06/22/17 0219 06/22/17 1649 06/23/17 0300 06/24/17 0316 06/25/17 0349 06/26/17 0305  AST 25 29 24   --  27 24  --   --   ALT 15* 17 15*  --  16* 16*  --   --   ALKPHOS 113 124 115  --  124 136*  --   --   BILITOT 1.0 1.0  0.7  --  0.6 0.8  --   --   PROT 6.2* 6.2* 6.3*  --  6.3* 7.0  --   --   ALBUMIN 2.2* 2.2* 2.1* 2.2* 2.2* 2.3* 2.3* 2.3*   No results for input(s): LIPASE, AMYLASE in the last 168 hours. No results for input(s): AMMONIA in the last 168 hours.  CBC:  Recent Labs Lab 06/22/17 0219 06/22/17 1650 06/23/17 0300 06/24/17 0316 06/26/17 0305  WBC 16.0* 16.6* 16.1* 16.8* 17.8*  HGB 7.7* 9.3* 8.9* 9.3* 9.7*  HCT 22.8* 28.1* 26.7* 28.0* 29.2*  MCV 78.9 79.6 80.4 80.7 80.4  PLT 354 391 406* 446* 475*   INR:  Recent Labs Lab 06/22/17 0219 06/23/17 0300 06/24/17 0316 06/25/17 0349 06/26/17 0305  INR 1.72 1.89 1.69 1.46 1.39   Other results:  Imaging   No results found.  Medications:     Scheduled Medications: . amiodarone  400 mg Oral BID  . aspirin EC  325 mg Oral Daily   Or  . aspirin  324 mg Per Tube Daily   Or  . aspirin  300 mg Rectal Daily  . atorvastatin  80 mg Oral q1800  . bisacodyl  10 mg Oral Daily   Or  . bisacodyl  10 mg Rectal Daily  . Chlorhexidine Gluconate Cloth  6 each Topical Daily  . darbepoetin (ARANESP) injection -  DIALYSIS  100 mcg Intravenous Q Sat-HD  . docusate sodium  200 mg Oral Daily  . feeding supplement (GLUCERNA SHAKE)  237 mL Oral TID BM  . feeding supplement (PRO-STAT SUGAR FREE 64)  30 mL Oral TID WC  . insulin aspart  0-24 Units Subcutaneous Q4H  . multivitamin  1 tablet Oral QHS  . mupirocin ointment   Nasal BID  . pantoprazole  40 mg Oral Daily  . sildenafil  20 mg Oral TID  . sodium chloride flush  10-40 mL Intracatheter Q12H  . sodium chloride flush  3 mL Intravenous Q12H  . Warfarin - Pharmacist Dosing Inpatient   Does not apply q1800    Infusions: . heparin 1,050 Units/hr (06/26/17 0700)  . lactated ringers Stopped (06/14/17 1400)  . lactated ringers Stopped (06/15/17 1901)    PRN Medications: heparin, heparin, hydrALAZINE, LORazepam, ondansetron (ZOFRAN) IV, oxyCODONE, promethazine, sodium chloride flush, sodium chloride flush, traMADol  Patient Profile   51 yo with history of CAD s/p anterior MI in 2010 and chronic systolic CHF (ischemic cardiomyopathy) as well as CKD stage III now s/p HeartMate 3 VAD placement.   Assessment/Plan:    1. Acute on chronic systolic CHF: Ischemic cardiomyopathy. St Jude ICD. NSTEMI in March 2018 with DES to LAD and RCA, complicated by cardiogenic shock, low output requiring milrinone. Most recent echo 8/18 with EF 20-25%, moderate MR. He was admitted and started on milrinone with improved creatinine.  HeartMate 3 VAD placement on 10/8. Speed cut back to 5400 with onset of afib/RVR 10/11.    - Milrinone stopped 10/19. No central line to check co-ox. Will continue sildenafil for RV support.   - Holding warfarin for now and covering  with heparin pending permanent dialysis access placement today. (Dr. Bridgett Larsson).  - INR 1.39. INR Goal 2-3 (aim slightly higher with h/o Factor V Leiden heterozygosity) and ASA 325. Will not lower aspirin dose with Factor V Leiden. D/w PharmD  - MAPs 70-90s. Hyralazine held with lowering volume on HD.  - Will  eventually need ramp echo prior to discharge. Will aim for early this  week, after access place.  - Continue to ambulate. Continue VAD education  - VAD parameters reviewed personally and stable at this time.  2. AKI on CKD:  - Seen by Dr. Marval Regal. Still no renal recovery.  Minimal UOP. Has had HD x 3, now on TTS schedule. Tolerated HD well last week.  - Pending permanent access today.  - INR 1.39. Per Dr. Jimmy Footman, resume coumadin once permcath and permanent access placed.  3. CAD: NSTEMI in 3/18. LHC with 99% ulcerated lesion proximal RCA with left to right collaterals, 95% mid LAD stenosis after mid LAD stent. s/p PCI to RCA and LAD on 11/25/16.  - No s/s of ischemia.    - Off plavix for LVAD.   - Continue statin.   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 Recent V/Q scan did not show acute or chronic PE.  He is a heterozygote for Factor V Leiden, plan for for ASA 325 and warfarin INR 2-3 with LVAD.  5. Chronic N/V/D with gastroparesis:  -Tolerating diet.  Has occasional nausea.  No change.  6. DM: - On Jardiance at home, will cover with sliding scale. Glucose stable.  7. S/P Multiple Tooth Extractions 4-15, 19-23, and 27-29.  - Stable.  8. Atrial fibrillation:  - Paroxysmal. In NSR.   - Decrease amio to 200 mg BID.  9. ID: CXR with LLL atelectasis versus infiltrate. Afebrile.   - He is off vancomycin, zosyn stopped 10/20 10. Hyponatremia:  - Na 129-130 today ( 2 BMETs drawn).  - Continue to follow. Free water restrict.  11. Anemia:  - Hgb stable at 9.7    I reviewed the LVAD parameters from today, and compared the results to the patient's prior recorded data.  No programming changes were made.  The LVAD is functioning within specified parameters.  The patient performs LVAD self-test daily.  LVAD interrogation was negative for any significant power changes, alarms or PI events/speed drops.  LVAD equipment check completed and is in good working order.  Back-up equipment  present.   LVAD education done on emergency procedures and precautions and reviewed exit site care.  Shirley Friar, PA-C  06/26/2017 7:22 AM  Patient seen with PA, agree with the above note.  Stable this morning, walked around halls yesterday, did well.  LVAD parameters stable.  MAP up to 90s at times. He is off warfarin and on heparin gtt today in preparation for placement of permanent HD access.  Will also need pacing wires out today.  - Resume warfarin/heparin gtt after HD access placed.  - TTS HD, no renal recovery at this point.  - Decrease amiodarone to 200 mg bid.  - Continue walking in halls/cardiac rehab.  - I suspect that he will need hydralazine on non-HD days.  Will discuss with Dr. Jimmy Footman, goal is to keep MAP < 90.  - Ramp echo, aim for Tuesday or Wednesday.   Boss Danielsen 06/26/2017 7:49 AM

## 2017-06-26 NOTE — Interval H&P Note (Signed)
   History and Physical Update  The patient was interviewed and re-examined.  The patient's previous History and Physical has been reviewed and is unchanged from my consult.  There is no change in the plan of care: tunneled dialysis catheter placement, placement of right arm arteriovenous fistula vs graft.   The patient is aware the risks of tunneled dialysis catheter placement include but are not limited to: bleeding, infection, central venous injury, pneumothorax, possible venous stenosis, possible malpositioning in the venous system, and possible infections related to long-term catheter presence.   Risk, benefits, and alternatives to access surgery were discussed.    The patient is aware the risks include but are not limited to: bleeding, infection, steal syndrome, nerve damage, ischemic monomelic neuropathy, thrombosis, failure to mature, complications related to venous hypertension, need for additional procedures, death and stroke.    The patient agrees to proceed forward with these procedure. Adele Barthel, MD, FACS [facility] Office: [phone] Pager: [phone]  06/26/2017, 12:46 PM

## 2017-06-26 NOTE — Anesthesia Postprocedure Evaluation (Signed)
Anesthesia Post Note  Patient: [name]  Procedure(s) Performed: INSERTION OF TUNNELED  DIALYSIS CATHETER RIGHT INTERNAL JUGULAR (Right ) ARTERIOVENOUS (AV) FISTULA CREATION VERSUS GRAFT RIGHT  ARM (Right )     Patient location during evaluation: PACU Anesthesia Type: MAC Level of consciousness: awake and alert Pain management: pain level controlled Vital Signs Assessment: post-procedure vital signs reviewed and stable Respiratory status: spontaneous breathing Cardiovascular status: stable Anesthetic complications: no    Last Vitals:  Vitals:   06/26/17 1500 06/26/17 1610  BP:  (S) (!) 131/94  Pulse:    Resp:    Temp: 36.5 C   SpO2:      Last Pain:  Vitals:   06/26/17 1500  TempSrc: Oral  PainSc:                  Nolon Nations

## 2017-06-26 NOTE — Progress Notes (Signed)
HeartMate 3 Rounding Note   postop day # 14  Subjective:    HeartMate 3 implantation October 8 with preoperative ischemic cardiomyopathy, advanced heart failure on IV inotropes, indication was destination therapy  Patient stable, extubated  postop day 1.No bleeding.Started on low dose heparin bridge to coumadin .    Acute on chronic renal failure with wt gain, increased volume, increased pulsatility- VAD speed up to 5600- now back to 5400 rpm with less PI events  Volume overload responded to CRT   Walking in hall  Oliguric Acute renal failure with rising cvp- trialysis cath placed 10-11 and CRT started goal 100 -200cc/hr removal for increased wt, cvp With wt and CVP normal will transition to intermittent HD and follow renal recovery  Diatek catheter placed 10-22 and R brachial AVF placed  Hx factor V Leiden deficiency. Now  with po coumadin-  INR  Goal2.5, dose per pharmD  LVAD INTERROGATION:  HeartMate IIILVAD:  Flow 4.2 liters/min, speed 5400, power 4.0, PI 3.0.  Controller Serial intact.   Objective:    Vital Signs:   Temp:  [97.5 F (36.4 C)-98.8 F (37.1 C)] 97.7 F (36.5 C) (10/22 1500) Pulse Rate:  [86-98] 86 (10/22 1100) Resp:  [11-29] 21 (10/22 1100) BP: (90-131)/(68-94) 131/94 (10/22 1610) SpO2:  [92 %-98 %] 96 % (10/22 1100) Weight:  [204 lb 9.4 oz (92.8 kg)] 204 lb 9.4 oz (92.8 kg) (10/22 0600) Last BM Date: 06/25/17 Mean arterial Pressure 85 mmHg  Intake/Output:   Intake/Output Summary (Last 24 hours) at 06/26/17 1624 Last data filed at 06/26/17 0800  Gross per 24 hour  Intake              828 ml  Output              100 ml  Net              728 ml     Physical Exam: General:  Well appearing. No resp difficulty HEENT: normal. Not bleeding after Diatek catheter Neck: supple. JVP .; no bruits. No lymphadenopathy or thryomegaly appreciated. Cor: Mechanical heart sounds with LVAD hum present. Lungs: clear Abdomen: soft, nontender, nondistended. No  hepatosplenomegaly. No bruits or masses. Good bowel sounds. Extremities: no cyanosis, clubbing, rash, mild edema of the extremities edema Neuro: alert & orientedx3, cranial nerves grossly intact. moves all 4 extremities w/o difficulty. Affect pleasant  Telemetry:nsr 90  Labs: Basic Metabolic Panel:  Recent Labs Lab 06/21/17 0215 06/22/17 0219 06/22/17 1649 06/23/17 0300 06/24/17 0316 06/25/17 0349 06/26/17 0305  NA 127* 127* 134* 131* 128* 133* 129*  130*  K 3.9 4.0 4.0 4.0 4.1 4.2 3.7  3.7  CL 91* 92* 97* 96* 92* 97* 96*  97*  CO2 23 24 28 27 24 22 24  24   GLUCOSE 148* 174* 102* 87 185* 187* 149*  150*  BUN 17 29* 9 16 33* 27* 38*  39*  CREATININE 4.04* 6.17* 3.16* 4.51* 6.63* 5.56* 7.52*  7.50*  CALCIUM 7.7* 8.0* 7.9* 7.9* 8.2* 8.3* 8.4*  8.4*  MG 1.9 2.0  --  1.9 2.1 1.8 2.1  PHOS 1.8* 3.4 1.6*  --   --  3.5 3.6    Liver Function Tests:  Recent Labs Lab 06/20/17 0351 06/21/17 0215 06/22/17 0219 06/22/17 1649 06/23/17 0300 06/24/17 0316 06/25/17 0349 06/26/17 0305  AST 25 29 24   --  27 24  --   --   ALT 15* 17 15*  --  16* 16*  --   --  ALKPHOS 113 124 115  --  124 136*  --   --   BILITOT 1.0 1.0 0.7  --  0.6 0.8  --   --   PROT 6.2* 6.2* 6.3*  --  6.3* 7.0  --   --   ALBUMIN 2.2* 2.2* 2.1* 2.2* 2.2* 2.3* 2.3* 2.3*   No results for input(s): LIPASE, AMYLASE in the last 168 hours. No results for input(s): AMMONIA in the last 168 hours.  CBC:  Recent Labs Lab 06/22/17 0219 06/22/17 1650 06/23/17 0300 06/24/17 0316 06/26/17 0305  WBC 16.0* 16.6* 16.1* 16.8* 17.8*  HGB 7.7* 9.3* 8.9* 9.3* 9.7*  HCT 22.8* 28.1* 26.7* 28.0* 29.2*  MCV 78.9 79.6 80.4 80.7 80.4  PLT 354 391 406* 446* 475*    INR:  Recent Labs Lab 06/22/17 0219 06/23/17 0300 06/24/17 0316 06/25/17 0349 06/26/17 0305  INR 1.72 1.89 1.69 1.46 1.39    Other results:  EKG:   Imaging: Dg Chest Port 1 View  Result Date: 06/26/2017 CLINICAL DATA:  End-stage renal  disease. EXAM: PORTABLE CHEST 1 VIEW COMPARISON:  Radiograph of June 24, 2017. FINDINGS: Stable cardiomegaly. Left-sided pacemaker and left ventricular assistance device are unchanged in position. No pneumothorax is noted. Mild left basilar atelectasis and effusion is again noted. Right lung is clear. Interval placement of right internal jugular dialysis catheter with distal tip in expected position of right atrium. Bony thorax is unremarkable. IMPRESSION: Mild left basilar atelectasis or effusion is noted. Stable position of left-sided pacemaker and left ventricular assistance device. Stable left basilar atelectasis and effusion is noted. Interval placement of right internal jugular dialysis catheter with distal tip in expected position of right atrium. Electronically Signed   By: Marijo Conception, M.D.   On: 06/26/2017 16:09   Dg Fluoro Guide Cv Line-no Report  Result Date: 06/26/2017 Fluoroscopy was utilized by the requesting physician.  No radiographic interpretation.     Medications:     Scheduled Medications: . amiodarone  200 mg Oral BID  . aspirin EC  325 mg Oral Daily   Or  . aspirin  324 mg Per Tube Daily   Or  . aspirin  300 mg Rectal Daily  . atorvastatin  80 mg Oral q1800  . bisacodyl  10 mg Oral Daily   Or  . bisacodyl  10 mg Rectal Daily  . Chlorhexidine Gluconate Cloth  6 each Topical Daily  . darbepoetin (ARANESP) injection - DIALYSIS  100 mcg Intravenous Q Sat-HD  . docusate sodium  200 mg Oral Daily  . feeding supplement (GLUCERNA SHAKE)  237 mL Oral TID BM  . feeding supplement (PRO-STAT SUGAR FREE 64)  30 mL Oral TID WC  . insulin aspart  0-24 Units Subcutaneous Q4H  . multivitamin  1 tablet Oral QHS  . mupirocin ointment   Nasal BID  . pantoprazole  40 mg Oral Daily  . sildenafil  20 mg Oral TID  . sodium chloride flush  10-40 mL Intracatheter Q12H  . sodium chloride flush  3 mL Intravenous Q12H  . warfarin  7.5 mg Oral ONCE-1800  . Warfarin - Pharmacist  Dosing Inpatient   Does not apply q1800    Infusions: . heparin    . lactated ringers Stopped (06/14/17 1400)  . lactated ringers Stopped (06/15/17 1901)    PRN Medications: heparin, heparin, hydrALAZINE, LORazepam, ondansetron (ZOFRAN) IV, oxyCODONE, promethazine, sodium chloride flush, sodium chloride flush, traMADol   Assessment:  HeartMate 3 VAD implant for ischemic cardiomyopathy  Preoperative ventricular ectopy with permanent pacemaker-AICD Preoperative moderate chronic kidney disease stage III Preoperative hemicolectomy for granulomatous colitis Poorly controlled diabetes mellitus with hemoglobin A1c 10.0, neuropathy Peripheral vascular disease with ABIs 0.5 bilaterally  postoperative acute renal failure with rising creatinine 4.2 and oliguria- HD cath placed and CRT applied 10-12 - 10-15, transitioned to HD one week then Diatek catheter placed 10-22 Postop atrial fib on amiodarone Excellent VAD performance postop  Plan/Discussion:      Remains oligureic NSR - on po amiodarone   All chest drains out - incisions clean, dry Ramp Echo planned this week Adv diet to mech soft Cont coumadin, 325 ASA Coumadin  7. 5 mg ordered for today Since heparin was not stopped will not remove pacing wires until heparin is off prior to discharge   I reviewed the LVAD parameters from today, and compared the results to the patient's prior recorded data.  No programming changes were made.  The LVAD is functioning within specified parameters.  The patient performs LVAD self-test daily.  LVAD interrogation was negative for any significant power changes, alarms or PI events/speed drops.  LVAD equipment check completed and is in good working order.  Back-up equipment present.   LVAD education done on emergency procedures and precautions and reviewed exit site care.  Length of Stay: [place] [facility] 06/26/2017, 4:24 PM

## 2017-06-26 NOTE — Progress Notes (Signed)
Visited with patient this morning.  Patient is sitting up in chair and says he feels pretty good today.  We talked about how good he was looking and he showed me a little portion of the incision which we agree that it looks really good as well.  [name] talks about going home next week but states that he wants to be sure that it is the right decision for him.  He is grateful for Chaplain support and for his medical team.  Chaplain appreciates the team provided support in all disciplines for him.  I will continue to follow and care for him.      06/26/17 6244  Clinical Encounter Type  Visited With Patient  Visit Type Follow-up;Spiritual support;Social support;Post-op  Referral From Chaplain  Consult/Referral To [employer]

## 2017-06-26 NOTE — Progress Notes (Signed)
PT Cancellation Note  Patient Details Name: ZAIM NITTA MRN: [id number] DOB: 1965/10/09   Cancelled Treatment:    Reason Eval/Treat Not Completed: Patient at procedure or test/unavailable (Pt getting tunneled HD cath.  Will return as able.  )   Denice Paradise 06/26/2017, 12:59 PM  [facility] [phone] (pager)

## 2017-06-26 NOTE — Progress Notes (Signed)
LVAD Coordinator Rounding Note:  Admitted 06/07/2017 for hemodynamic optimization prior to LVAD placement. Full dental extractions prior to LVAD implant.  HM3LVAD implated on 10/8/2018by Dr Craige Cotta Destination Therapycriteria due to renal insufficiency.  Vital signs: Temp: 98 HR: 103 - sinus tach Doppler:  Not done Auto cuff: 109/82 (92) O2 Sat: 94% on RA  Wt:194>217>220>220>226>213>207>199>198 >198>199>201>207>204lbs   LVAD interrogation reveals:   Speed: 5400 Flow:  4.2 Power: 4.0w PI: 4.2 Alarms: none Events: none Hematocrit: 29 Fixed speed: 5400 Low speed limit: 5100   Drive Line: Daily. Change daily per protocol using daily kits and silver strip.   Labs:  LDH trend:255>343>379>376>366>314>304>286>284>257  INR trend: 1.44>1.23>1.44>1.47>1.93>2.41>2.13>1.72>1.89>1.39  Hgb trend: 9.6>8.8>8.1>8.6>8.8>8.6>8.1>8.0>7.7>8.9>9.7  Anticoagulation Plan: -INR Goal: 2-3, warfarin restarted 10/9  -ASA Dose: 342m (will remain on this with factor V heterozygosity) -Heparin level 0.3 - 0.7  Blood Products:  - 2 units FFP in OR - 1 unit PC in OR - 1 unit PRBC's 10/12 - 1 unit PRBC's 10/18  NO: off 06/19/17  Gtts: - Milrinone 0.25 mcg/mg/min - wean to 0.125 today off 10/18 - Heparin 1050u/hr  Device: -St Jude  -Therapies: off. Perm pacer set at 60 bpm. -Temp pacer set AAI at 88 bpm, MA-5  Arrythmias: Atrial fib started 10/12. Amiodarone started 06/12/17 initially for ventricular arrhythmia   Respiratory: extubated 10/9   Renal:  Creat trend:  1.62>2.75>4.04>3.71>>2.56>2.44>2.52>4.52>4.4>6.17 - Oliguric renal failure with rising CVP - Trialysis cath placed 06/16/17 and CVVHD started goal 100 cc/hr removal for increased wt, CVP - Stop CVVHD 06/19/17 and plan for intermittent hemodialysis while pt oliguric -  06/20/17 - HD -  06/22/17 - HD later today  Adverse Events on VAD: -Oliguric renal failure post implant with initiation  of CVVHD and HD  Patient Education: Patient switching from batteries to power module without difficulty. Pt performed self test this am on his controller without difficulty. Walked in hallway x 2 this am and still is having problems with holster vest; wearing controller around neck. Will try Holster vest next walk.   Pt does c/o of decreased appetite and bouts of nausea. He is drinking his Glucerna shakes x 3 daily. Informed him to try and eat smaller, more frequent meals until appetite returns, and stressed importance of protein intake to promote wound healing. Pt verbalized understanding of same.   Plan/Recommendations:  1. Continue daily dressing changes with family education. 2. Asked patient and caregivers to complete reading HM III Patient Manual prior to discharge teaching session 3. VAD coordinator will accompany pt to OR today for perm cath and fistula placement.  4. Pt can transfer to [place]  5. We will need to establish the pt site where [name] will be for dialysis. They may need VAD training before they will accept the pt. This education process takes time and needs to be started early. 6. Call VAD Coordinator if any questions re: VAD or VAD equipment.   STanda RockersRN, VAD Coordinator 24/7 pager [phone]

## 2017-06-26 NOTE — Progress Notes (Signed)
Subjective: Interval History: has no complaint, ready to progress, breathing ok.  .  Objective: Vital signs in last 24 hours: Temp:  [97.5 F (36.4 C)-99.1 F (37.3 C)] 97.5 F (36.4 C) (10/22 0300) Pulse Rate:  [62-98] 86 (10/22 0700) Resp:  [11-29] 28 (10/22 0700) BP: (90-131)/(68-99) 113/86 (10/22 0700) SpO2:  [92 %-98 %] 96 % (10/22 0700) Weight:  [92.8 kg (204 lb 9.4 oz)] 92.8 kg (204 lb 9.4 oz) (10/22 0600) Weight change: -3 kg (-6 lb 9.8 oz)  Intake/Output from previous day: 10/21 0701 - 10/22 0700 In: 1735 [P.O.:1200; I.V.:252] Out: 100 [Urine:100] Intake/Output this shift: No intake/output data recorded.  General appearance: alert, cooperative, no distress and mildly obese Resp: diminished breath sounds bibasilar Cardio: below GI: cont hum of LVAD Extremities: edema 1-2+  GI pos bs, liver down 5 cm, obese  Lab Results:  Recent Labs  06/24/17 0316 06/26/17 0305  WBC 16.8* 17.8*  HGB 9.3* 9.7*  HCT 28.0* 29.2*  PLT 446* 475*   BMET:  Recent Labs  06/25/17 0349 06/26/17 0305  NA 133* 129*  130*  K 4.2 3.7  3.7  CL 97* 96*  97*  CO2 22 24  24   GLUCOSE 187* 149*  150*  BUN 27* 38*  39*  CREATININE 5.56* 7.52*  7.50*  CALCIUM 8.3* 8.4*  8.4*   No results for input(s): PTH in the last 72 hours. Iron Studies: No results for input(s): IRON, TIBC, TRANSFERRIN, FERRITIN in the last 72 hours.  Studies/Results: No results found.  I have reviewed the patient's current medications.  Assessment/Plan: 1 CKD/AKI oliguric , recovery not likely, but poss. Need PC and  Perm access 2 Anemia esa, recheck Fe 3 HPTH check 4 DM controlled 5 CM LVAD, per Card. Hold hydral for now with lowering vol on HD 6  Afib on amio 7 Hx DVTs P HD, esa, check Fe, PTH   LOS: 19 days   [name] 06/26/2017,7:17 AM

## 2017-06-26 NOTE — H&P (View-Only) (Signed)
Requested by:  Dr. Marval Regal  Reason for consultation: New access   History of Present Illness   [name] is a 51 y.o. (April 25, 1966) male with LVAD who presents for evaluation for permanent access.  The patient is right hand dominant.  The patient has not had previous access procedures.  Previous central venous cannulation procedures include: right internal jugular vein tri-vascular catheter.  Patient has a left SCV PPM.  Patient had LVAD placed on 06/12/17 by Dr. Darcey Nora.  Patient's kidney function has acutely deteriorate and he had to have trivascular catheter placed on 06/15/17.  Patient has been on CVVHD since then.  Reportedly patient has demonstrated no renal recovery so permanent end stage renal disease is now likely.  TDC and permanent access is requested.   Past Medical History:  Diagnosis Date  . Angina   . ASCVD (arteriosclerotic cardiovascular disease)    , Anterior infarction 2005, LAD diagonal bifurcation intervention 03/2004  . Automatic implantable cardiac defibrillator -[employer]. Jude's       . Benign neoplasm of colon   . CHF (congestive heart failure) ([place])   . Chronic systolic heart failure ([place])   . Coronary artery disease     Widely patent previously placed stents in the left anterior   . Crohn's disease ([place])   . 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 Dyspnea   . Gastroparesis   . GERD (gastroesophageal reflux disease)   . High cholesterol   . Hyperlipidemia   . Hypersomnolent    Previous diagnosis of narcolepsy  . Hypertension, essential   . Ischemic cardiomyopathy    Ejection fraction 15-20% catheterization 2010  . Type II or unspecified type diabetes mellitus without mention of complication, not stated as uncontrolled   . Unspecified gastritis and gastroduodenitis without mention of hemorrhage     Past Surgical History:  Procedure Laterality Date  . APPENDECTOMY    . CARDIAC DEFIBRILLATOR PLACEMENT  2010   St. Jude ICD   . COLECTOMY     "for Crohn's"  . COLON SURGERY    . CORONARY STENT INTERVENTION N/A 11/25/2016   Procedure: Coronary Stent Intervention;  Surgeon: Leonie Man, MD;  Location: [facility];  Service: Cardiovascular;  Laterality: N/A;  . EP IMPLANTABLE DEVICE N/A 09/14/2016   Procedure: ICD Generator Changeout;  Surgeon: Deboraha Sprang, MD;  Location: [facility];  Service: Cardiovascular;  Laterality: N/A;  . FETAL SURGERY FOR CONGENITAL HERNIA    . INGUINAL HERNIA REPAIR    . INSERTION OF IMPLANTABLE LEFT VENTRICULAR ASSIST DEVICE N/A 06/12/2017   Procedure: INSERTION OF IMPLANTABLE LEFT VENTRICULAR ASSIST [place] 3;  Surgeon: Ivin Poot, MD;  Location: [facility];  Service: Open Heart Surgery;  Laterality: N/A;  HM3 LVAD  CIRC ARREST  NITRIC OXIDE  . MULTIPLE EXTRACTIONS WITH ALVEOLOPLASTY N/A 06/08/2017   Procedure: MULTIPLE EXTRACTION WITH ALVEOLOPLASTY AND PRE PROSTHETIC SURGERY AS NEEDED;  Surgeon: Lenn Cal, DDS;  Location: [facility];  Service: Oral Surgery;  Laterality: N/A;  . RIGHT HEART CATH N/A 06/07/2017   Procedure: RIGHT HEART CATH;  Surgeon: Larey Dresser, MD;  Location: [facility];  Service: Cardiovascular;  Laterality: N/A;  . RIGHT/LEFT HEART CATH AND CORONARY ANGIOGRAPHY N/A 11/23/2016   Procedure: Right/Left Heart Cath and Coronary Angiography;  Surgeon: Larey Dresser, MD;  Location: [facility];  Service: Cardiovascular;  Laterality: N/A;  . TEE WITHOUT CARDIOVERSION N/A 06/12/2017  Procedure: TRANSESOPHAGEAL ECHOCARDIOGRAM (TEE);  Surgeon: Prescott Gum, Collier Salina, MD;  Location: [facility];  Service: Open Heart Surgery;  Laterality: N/A;    Social History   Social History  . Marital status: Married    Spouse name: N/A  . Number of children: N/A  . Years of education: N/A   Occupational History  . Not on file.   Social History Main Topics  . Smoking status: Never Smoker  . Smokeless tobacco: Never Used  . Alcohol use No    . Drug use: No  . Sexual activity: No   Other Topics Concern  . Not on file   Social History Narrative  . No narrative on file   Family History  Problem Relation Age of Onset  . Colon polyps Mother   . Diabetes Mother   . Heart attack Father   . Diabetes Father   . Stroke Paternal Grandfather   . Colitis Sister   . Heart disease Brother   . Colitis Sister   . Colon cancer Neg Hx     Current Facility-Administered Medications  Medication Dose Route Frequency Provider Last Rate Last Dose  . amiodarone (PACERONE) tablet 400 mg  400 mg Oral BID Prescott Gum, Collier Salina, MD   400 mg at 06/22/17 1151  . aspirin EC tablet 325 mg  325 mg Oral Daily Clegg, Amy D, NP   325 mg at 06/22/17 1151   Or  . aspirin chewable tablet 324 mg  324 mg Per Tube Daily Clegg, Amy D, NP       Or  . aspirin suppository 300 mg  300 mg Rectal Daily Clegg, Amy D, NP      . atorvastatin (LIPITOR) tablet 80 mg  80 mg Oral q1800 Clegg, Amy D, NP   80 mg at 06/21/17 1711  . bisacodyl (DULCOLAX) EC tablet 10 mg  10 mg Oral Daily Prescott Gum, Collier Salina, MD   10 mg at 06/17/17 1057   Or  . bisacodyl (DULCOLAX) suppository 10 mg  10 mg Rectal Daily Prescott Gum, Collier Salina, MD      . chlorhexidine (PERIDEX) 0.12 % solution 15 mL  15 mL Mouth Rinse BID Ivin Poot, MD   15 mL at 06/22/17 [phone]  . Chlorhexidine Gluconate Cloth 2 % PADS 6 each  6 each Topical Daily Ivin Poot, MD   6 each at 06/22/17 1152  . Darbepoetin Alfa (ARANESP) injection 100 mcg  100 mcg Intravenous Q Sat-HD Edrick Oh, MD   100 mcg at 06/17/17 1542  . docusate sodium (COLACE) capsule 200 mg  200 mg Oral Daily Prescott Gum, Collier Salina, MD   200 mg at 06/17/17 1057  . feeding supplement (GLUCERNA SHAKE) (GLUCERNA SHAKE) liquid 237 mL  237 mL Oral TID BM Prescott Gum, Collier Salina, MD   237 mL at 06/22/17 1152  . heparin ADULT infusion 100 units/mL (25000 units/25m sodium chloride 0.45%)  800 Units/hr Intravenous Continuous BEinar Grad RPH 8 mL/hr at 06/22/17 1000  800 Units/hr at 06/22/17 1000  . heparin injection 1,800 Units  20 Units/kg Dialysis PRN CDonato Heinz MD      . hydrALAZINE (APRESOLINE) injection 10 mg  10 mg Intravenous Q4H PRN VIvin Poot MD   10 mg at 06/22/17 0503  . insulin aspart (novoLOG) injection 0-24 Units  0-24 Units Subcutaneous Q4H VIvin Poot MD   2 Units at 06/22/17 1230  . lactated ringers infusion   Intravenous Continuous VIvin Poot MD   Stopped  at 06/14/17 1400  . lactated ringers infusion   Intravenous Continuous Ivin Poot, MD   Stopped at 06/15/17 1901  . LORazepam (ATIVAN) injection 0.5 mg  0.5 mg Intravenous Q8H PRN Ivin Poot, MD   0.5 mg at 06/21/17 0114  . MEDLINE mouth rinse  15 mL Mouth Rinse q12n4p Prescott Gum, Collier Salina, MD   15 mL at 06/22/17 1617  . milrinone (PRIMACOR) 20 MG/100 ML (0.2 mg/mL) infusion  0.125 mcg/kg/min Intravenous Continuous Larey Dresser, MD 3.7 mL/hr at 06/22/17 1000 0.125 mcg/kg/min at 06/22/17 1000  . mupirocin ointment (BACTROBAN) 2 %   Nasal BID Prescott Gum, Collier Salina, MD      . ondansetron [facility]) injection 4 mg  4 mg Intravenous Q6H PRN Ivin Poot, MD   4 mg at 06/22/17 0350  . oxyCODONE (Oxy IR/ROXICODONE) immediate release tablet 5-10 mg  5-10 mg Oral Q3H PRN Ivin Poot, MD   5 mg at 06/17/17 0347  . pantoprazole (PROTONIX) EC tablet 40 mg  40 mg Oral Daily Prescott Gum, Collier Salina, MD   40 mg at 06/22/17 1151  . piperacillin-tazobactam (ZOSYN) IVPB 3.375 g  3.375 g Intravenous Q12H Einar Grad, [facility]   Stopped at 06/22/17 0900  . promethazine (PHENERGAN) injection 12.5 mg  12.5 mg Intravenous Q4H PRN Ivin Poot, MD   12.5 mg at 06/18/17 0720  . sildenafil (REVATIO) tablet 20 mg  20 mg Oral TID Larey Dresser, MD   20 mg at 06/22/17 1621  . sodium chloride flush (NS) 0.9 % injection 10-40 mL  10-40 mL Intracatheter Q12H Prescott Gum, Collier Salina, MD   20 mL at 06/22/17 1151  . sodium chloride flush (NS) 0.9 % injection 10-40 mL  10-40 mL  Intracatheter PRN Prescott Gum, Collier Salina, MD      . sodium chloride flush (NS) 0.9 % injection 3 mL  3 mL Intravenous Q12H Prescott Gum, Collier Salina, MD   3 mL at 06/22/17 1152  . sodium chloride flush (NS) 0.9 % injection 3 mL  3 mL Intravenous PRN Prescott Gum, Collier Salina, MD      . traMADol Veatrice Bourbon) tablet 50-100 mg  50-100 mg Oral Q4H PRN Prescott Gum, Collier Salina, MD      . warfarin (COUMADIN) tablet 5 mg  5 mg Oral ONCE-1800 Einar Grad, [facility]      . Warfarin - Pharmacist Dosing Inpatient   Does not apply q1800 Carney, Gay Filler, [facility]        Allergies  Allergen Reactions  . Metformin And Related Diarrhea    REVIEW OF SYSTEMS (negative unless checked):   Cardiac:  []  Chest pain or chest pressure? [x]  Shortness of breath upon activity? [x]  Shortness of breath when lying flat? []  Irregular heart rhythm?  Vascular:  []  Pain in calf, thigh, or hip brought on by walking? []  Pain in feet at night that wakes you up from your sleep? [x]  Blood clot in your veins? [x]  Leg swelling?  Pulmonary:  []  Oxygen at home? []  Productive cough? []  Wheezing?  Neurologic:  []  Sudden weakness in arms or legs? []  Sudden numbness in arms or legs? []  Sudden onset of difficult speaking or slurred speech? []  Temporary loss of vision in one eye? []  Problems with dizziness?  Gastrointestinal:  []  Blood in stool? []  Vomited blood?  Genitourinary:  []  Burning when urinating? []  Blood in urine?  Psychiatric:  []  Major depression  Hematologic:  []  Bleeding problems? []  Problems with blood clotting?  Dermatologic:  []   Rashes or ulcers?  Constitutional:  []  Fever or chills?  Ear/Nose/Throat:  []  Change in hearing? []  Nose bleeds? []  Sore throat?  Musculoskeletal:  []  Back pain? []  Joint pain? []  Muscle pain?   Physical Examination     Vitals:   06/22/17 1530 06/22/17 1545 06/22/17 1555 06/22/17 1600  BP: 110/75 101/74 96/75 95/83   Pulse: (!) 103 (!) 101 99 (!) 101  Resp: 18 18 17 12   Temp:   98.1  F (36.7 C)   TempSrc:      SpO2: 95% 94% 94% 95%  Weight:   209 lb 14.1 oz (95.2 kg)   Height:       Body mass index is 31.91 kg/m.  General Alert, O x 3, WD, NAD  Head [place]/AT,    Ear/Nose/ Throat Hearing grossly intact, nares without erythema or drainage,   Eyes PERRLA, EOMI,    Neck Supple, mid-line trachea,    Pulmonary Sym exp, good B air movt, CTA B  Cardiac Mechanical sound consistent with LVAD, no murmur,   Vascular Vessel Right Left  Radial Not palpable Not palpable  Brachial Not palpable Not palpable  Carotid Not palpable Not palpable  Aorta Not palpable N/A  Femoral Not palpable Not palpable  Popliteal Not palpable Not palpable  PT Not palpable Not palpable  DP Not palpable Not palpable    Gastro- intestinal soft, non-distended, non-tender to palpation, No guarding or rebound, healed surgical incision  Musculo- skeletal Viable limbs throughout, limited MS exam, able to lift limbs against gravity  Neurologic Cranial nerves grosslyintact , Pain and light touch intact in extremities , Motor exam as listed above  Psychiatric Judgement intact, Mood & affect appropriate for pt's clinical situation  Dermatologic See M/S exam for extremity exam, No rashes otherwise noted  Lymphatic  Palpable lymph nodes: None     Non-invasive Vascular Imaging   BUE Vein Mapping  Ordered   Laboratory   CBC CBC Latest Ref Rng & Units 06/22/2017 06/21/2017 06/20/2017  WBC 4.0 - 10.5 K/uL 16.0(H) 17.6(H) 16.0(H)  Hemoglobin 13.0 - 17.0 g/dL 7.7(L) 8.0(L) 8.1(L)  Hematocrit 39.0 - 52.0 % 22.8(L) 24.3(L) 24.2(L)  Platelets 150 - 400 K/uL 354 336 321    BMP BMP Latest Ref Rng & Units 06/22/2017 06/21/2017 06/20/2017  Glucose 65 - 99 mg/dL 174(H) 148(H) 144(H)  BUN 6 - 20 mg/dL 29(H) 17 20  Creatinine 0.61 - 1.24 mg/dL 6.17(H) 4.04(H) 4.52(H)  BUN/Creat Ratio 9 - 20 - - -  Sodium 135 - 145 mmol/L 127(L) 127(L) 129(L)  Potassium 3.5 - 5.1 mmol/L 4.0 3.9 3.7  Chloride 101 - 111  mmol/L 92(L) 91(L) 93(L)  CO2 22 - 32 mmol/L 24 23 23   Calcium 8.9 - 10.3 mg/dL 8.0(L) 7.7(L) 7.8(L)    Coagulation Lab Results  Component Value Date   INR 1.72 06/22/2017   INR 2.13 06/21/2017   INR 2.41 06/20/2017   No results found for: PTT  Lipids    Component Value Date/Time   CHOL 128 05/04/2017 1446   TRIG 118 05/04/2017 1446   HDL 50 05/04/2017 1446   CHOLHDL 2.6 05/04/2017 1446   VLDL 24 05/04/2017 1446   LDLCALC 54 05/04/2017 1446    Radiology     Dg Chest Port 1 View  Result Date: 06/22/2017 CLINICAL DATA:  LVAD.  Sore chest EXAM: PORTABLE CHEST 1 VIEW COMPARISON:  Two days ago FINDINGS: Right-sided central lines with tip at the upper right atrium. Dual-chamber ICD/ pacer in stable  position. An LVAD is present. Low volume chest with atelectatic opacities greater on the left. Probable small left effusion. No pneumothorax. IMPRESSION: 1. Stable from 2 days prior. 2. Low volume chest with left more than right atelectasis. Electronically Signed   By: Monte Fantasia M.D.   On: 06/22/2017 07:28     Medical Decision Making   [name] is a 51 y.o. male who presents with acute on chronic kidney disease, possible permanent ESRD, heart failure s/p LVAD, s/p prior L SCV PPM   Vein mapping is pending.  Presence of Left SCV pacer wires will limit the patency of any left sided access.  From review of chart, some further medical optimization is in progress.  Will plan on [facility] and R side access placement on Monday, 22 OCT 18.  Please keep patient on heparin drip to facilitate the procedure on Monday  I had an extensive discussion with this patient in regards to the nature of access surgery, including risk, benefits, and alternatives.    The patient is aware that the risks of access surgery include but are not limited to: bleeding, infection, steal syndrome, nerve damage, ischemic monomelic neuropathy, failure of access to mature, complications related to venous  hypertension, and possible need for additional access procedures in the future.  The patient has agreed to proceed with the above procedures.   Adele Barthel, MD, FACS [facility] [place] [facility] Office: [phone] Pager: [phone]  06/22/2017, 4:58 PM

## 2017-06-26 NOTE — Op Note (Signed)
OPERATIVE NOTE  PROCEDURE: 1.  Right internal jugular vein tunneled dialysis catheter placement 2.  Right internal jugular vein cannulation under ultrasound guidance 3.  Right first stage brachial vein transposition   PRE-OPERATIVE DIAGNOSIS: end-stage renal failure  POST-OPERATIVE DIAGNOSIS: same as above  SURGEON: Adele Barthel, MD  ANESTHESIA: local and MAC  ESTIMATED BLOOD LOSS: 30 cc  FINDING(S): 1.  Tips of the catheter in the right atrium on fluoroscopy 2.  No obvious pneumothorax on fluoroscopy 3.  Brachial vein: 3 mm, acceptable 4.  Brachial artery: 2.5 mm, limited atherosclerotic plaque 5.  Venous outflow: palpable thrill (for LVAD) 6.  Radial flow: dopplerable radial signal (signal consistent with LVAD)   SPECIMEN(S):  none  INDICATIONS:   [name] is a 51 y.o. male with LVAD who presents with hemodialysis dependence.  The patient presents for tunneled dialysis catheter placement and right arm permanent access placement.  The patient is aware the risks of tunneled dialysis catheter placement include but are not limited to: bleeding, infection, central venous injury, pneumothorax, possible venous stenosis, possible malpositioning in the venous system, and possible infections related to long-term catheter presence.    Risk, benefits, and alternatives to access surgery were discussed.  The patient is aware the risks include but are not limited to: bleeding, infection, steal syndrome, nerve damage, ischemic monomelic neuropathy, thrombosis, failure to mature, complications related to venous hypertension, need for additional procedures, death and stroke.  The patient agrees to proceed forward with these procedures.  DESCRIPTION: After written full informed consent was obtained from the patient, the patient was taken back to the operating room.  Prior to induction, the patient was given IV antibiotics.  After obtaining adequate sedation, the patient was prepped and  draped in the standard fashion for a chest or neck tunneled dialysis catheter placement.     The cannulation site, the catheter exit site, and tract for the subcutaneous tunnel were then anesthestized with a total of 10 cc of 1% lidocaine without epinephrine.  Under ultrasound guidance, the right internal jugular vein was cannulated with the 18 gauge needle.  A J-wire was then placed down into the inferior vena cava under fluoroscopic guidance.  The wire was then secured in place with a clamp to the drapes.  The tapered wire guide was left occluding the lumen of the needle to avoid air embolism.    I then made stab incisions at the neck and exit sites.   I dissected from the exit site to the cannulation site with a tunneler.   The wire was then unclamped and I removed the needle.  The skin tract and venotomy was dilated serially with graduated dilators, passed under fluoroscopic guidance.   Finally, the dilator-sheath was placed under fluoroscopic guidance into the superior vena cava.  The central dilator and wire were removed.  A 23 cm Palindrome catheter was placed under fluoroscopic guidance down into the right atrium.    The sheath was broken and peeled away while holding the catheter cuff at the level of the skin.  The catheter was clamped with the plastic clamp and the back end of this catheter was transected.  One lumen was docked onto the tunneler.  The catheter was delivered through the subcutaneous tunnel by pulling the tunneler out the exit site.  The catheter was reclamped and was transected a second time, revealing the two lumens of this catheter.  The catheter collar was loaded over the back end of the catheter.  The two  ports were docked onto these two lumens.  The catheter collar was then snapped into place, securing the two ports.  Each port was tested by aspirating and flushing.  No resistance was noted.  Each port was then thoroughly flushed with heparinized saline.  The catheter was  secured in placed with two interrupted stitches of 3-0 Nylon tied to the catheter.  The neck incision was closed with a U-stitch of 4-0 Monocryl.  The neck and chest incision were cleaned.  The closed neck incision was reinforced with Dermabond and sterile bandages applied to the catheter exit site.  Each port was then loaded with concentrated heparin (1000 Units/mL) at the manufacturer recommended volumes to each port.  Sterile caps were applied to each port.    On completion fluoroscopy, the tips of the catheter were in the right atrium, and there was no evidence of pneumothorax.  The patient had the beginnings of hematoma in the subcutaneous tunnel, which is not surprising as the patient is fully anticoagulation on a heparin drip.  Pressure was held to the subcutaneous tunnel.    At this point, the drapes were taken down.  The patient was reposition and redraped for a right arm access procedure.    After full informed written consent was obtained from the patient, the patient was brought back to the operating room and placed supine upon the operating table.  Prior to induction, the patient received IV antibiotics.   After obtaining adequate anesthesia, the patient was then prepped and draped in the standard fashion for a right arm access procedure.    Based on preoperative vein mapping, there was no adequate cephalic vein or basilic vein.  Under Sonosite evaluation, I felt neither the cephalic vein nor basilic vein were adequate.  However, I felt there was brachial vein that might be adequate for access purposes.    I turned my attention first to identifying the patient's brachial vein and brachial artery.  Using SonoSite guidance, the location of these vessels were marked out on the skin.   At this point, I injected local anesthetic to obtain a field block of the antecubitum.  In total, I injected about 5 mL of 1% lidocaine without epinephrine.  I made a longitudinal incision at the level of the  antecubitum and dissected through the subcutaneous tissue and fascia to gain exposure of the brachial artery.  This was noted to be 2.5 mm in diameter externally.  This was dissected out proximally and distally and controlled with vessel loops .  I then dissected out the brachial vein.  This was noted to be 2.5-3.0 mm in diameter externally.  The distal segment of the vein was ligated with a  2-0 silk, and the vein was transected.  The proximal segment was interrogated with serial dilators.  The vein accepted up to a 3 mm dilator without any difficulty.  I then instilled the heparinized saline into the vein and clamped it.  At this point, I reset my exposure of the brachial artery and placed the artery under tension proximally and distally.  I made an arteriotomy with a #11 blade, and then I extended the arteriotomy with a Potts scissor.  I injected heparinized saline proximal and distal to this arteriotomy.  The vein was then sewn to the artery in an end-to-side configuration with a running stitch of 7-0 Prolene.  Prior to completing this anastomosis, I allowed the vein and artery to backbleed.  There was no evidence of clot  from any vessels.  I completed the anastomosis in the usual fashion and then released all vessel loops and clamps.    There was a faintly palpable thrill (for LVAD) in the venous outflow, and there was a dopplerable radial signal (consistent with LVAD).   At this point, I irrigated out the surgical wound.  There was no further active bleeding.  The subcutaneous tissue was reapproximated with a running stitch of 3-0 Vicryl.  The skin was then reapproximated with a running subcuticular stitch of 4-0 Vicryl.  The skin was then cleaned, dried, and reinforced with Dermabond.  The patient tolerated this procedure well.    COMPLICATIONS: none  CONDITION: stable   Adele Barthel, MD, [facility] [place] Office: [phone] Pager: [phone]  06/26/2017, 2:27  PM

## 2017-06-26 NOTE — Progress Notes (Signed)
VAD Coordinator Procedure Note:   Patient underwent tunneled dialysis catheter and AV fistula. Hemodynamics and VAD parameters monitored by me throughout the procedure. MAPs were obtained with automatic BP cuff on left arm.      Doppler Auto cuff(MAP):  Flow: PI: Power:     Speed:      Time:          Pre-procedure:122   125/107(115)      3.7  6.1    4.0        5400                      Sedation Induction:                 97/85 (91)      4.8  2.3     3.9        5400            1415            93/74 (81)      4.8  2.8    3.9        5400            1430          104/89 (95)      4.7  2.4    4.0        5400            1445          84/66 (77)      4.7  2.7    4.0        5400            1500          91/68 (77)      4.6  2.7    4.6        5400            1515          88/74 (80)      4.7  2.8    4.7        5400            1530             Recovery area:      120/97 (106)     4.0  4.3     4.0        5400       Patient tolerated the procedure well. After adequate sedation was achieved, pulse ox waveform accurate and maintained >92% throughout the remainder of the procedure. MAPs were within normal limits.   Patient Disposition: 2H10

## 2017-06-26 NOTE — Anesthesia Preprocedure Evaluation (Signed)
Anesthesia Evaluation  Patient identified by MRN, date of birth, ID band Patient awake    Reviewed: Allergy & Precautions, NPO status , Patient's Chart, lab work & pertinent test results  Airway Mallampati: II  TM Distance: >3 FB Neck ROM: Full    Dental  (+) Edentulous Upper, Edentulous Lower   Pulmonary neg pulmonary ROS, shortness of breath,     + decreased breath sounds      Cardiovascular hypertension, + angina + CAD, + Past MI and +CHF  + Cardiac Defibrillator  Rhythm:Regular Rate:Normal     Neuro/Psych negative neurological ROS  negative psych ROS   GI/Hepatic Neg liver ROS, GERD  ,  Endo/Other  negative endocrine ROSdiabetes  Renal/GU Renal diseasenegative Renal ROS  negative genitourinary   Musculoskeletal negative musculoskeletal ROS (+)   Abdominal   Peds negative pediatric ROS (+)  Hematology negative hematology ROS (+)   Anesthesia Other Findings   Reproductive/Obstetrics negative OB ROS                             Anesthesia Physical  Anesthesia Plan  ASA: IV  Anesthesia Plan: MAC   Post-op Pain Management:    Induction: Intravenous  PONV Risk Score and Plan: Ondansetron and Midazolam  Airway Management Planned:   Additional Equipment:   Intra-op Plan:   Post-operative Plan:   Informed Consent: I have reviewed the patients History and Physical, chart, labs and discussed the procedure including the risks, benefits and alternatives for the proposed anesthesia with the patient or authorized representative who has indicated his/her understanding and acceptance.     Plan Discussed with: CRNA  Anesthesia Plan Comments:         Anesthesia Quick Evaluation

## 2017-06-26 NOTE — Progress Notes (Addendum)
[facility] for Coumadin/Heparin Indication: LVAD, h/o DVT, Factor V Leiden heterozygosity  Allergies  Allergen Reactions  . Metformin And Related Diarrhea    Patient Measurements: Height: 5' 8"  (172.7 cm) Weight: 204 lb 9.4 oz (92.8 kg) IBW/kg (Calculated) : 68.4   Vital Signs: Temp: 97.5 F (36.4 C) (10/22 0300) Temp Source: Oral (10/22 0300) BP: 113/86 (10/22 0700) Pulse Rate: 86 (10/22 0700)  Labs:  Recent Labs  06/24/17 0316 06/25/17 0349 06/26/17 0305  HGB 9.3*  --  9.7*  HCT 28.0*  --  29.2*  PLT 446*  --  475*  LABPROT 19.7* 17.6* 17.0*  INR 1.69 1.46 1.39  HEPARINUNFRC 0.36 0.53 0.39  CREATININE 6.63* 5.56* 7.52*  7.50*    Estimated Creatinine Clearance: 12.7 mL/min (A) (by C-G formula based on SCr of 7.5 mg/dL (H)).   Medical History: Past Medical History:  Diagnosis Date  . Angina   . ASCVD (arteriosclerotic cardiovascular disease)    , Anterior infarction 2005, LAD diagonal bifurcation intervention 03/2004  . Automatic implantable cardiac defibrillator -St. Jude's       . Benign neoplasm of colon   . CHF (congestive heart failure) ([place])   . Chronic systolic heart failure ([place])   . Coronary artery disease     Widely patent previously placed stents in the left anterior   . Crohn's disease ([place])   . 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 Dyspnea   . Gastroparesis   . GERD (gastroesophageal reflux disease)   . High cholesterol   . Hyperlipidemia   . Hypersomnolent    Previous diagnosis of narcolepsy  . Hypertension, essential   . Ischemic cardiomyopathy    Ejection fraction 15-20% catheterization 2010  . Type II or unspecified type diabetes mellitus without mention of complication, not stated as uncontrolled   . Unspecified gastritis and gastroduodenitis without mention of hemorrhage     Assessment: 12 yoM on chronic Coumadin for Factor 5 Leiden and hx DVTs admitted for LVAD  placement. Heparin started on 10/19 with subtherapeutic INR, now planning to hold warfarin over the weekend and cover with heparin as pt will need vascular access placed Monday for HD.   INR remains subtherapeutic at 1.39 with last dose of warfarin on 10/18. CBC and LDH stable, heparin level therapeutic at 0.39. To resume Coumadin tonight after access placement.  Goal of Therapy:  INR 2-3  Heparin Level 0.3-0.7 Monitor platelets by anticoagulation protocol: Yes   Plan:  -Coumadin 7.64m PO x1 tonight -Continue heparin at 1050 units/hr -Monitor heparin level,CBC, INR daily  MArrie Senate PharmD PGY-2 Cardiology Pharmacy Resident Pager: [phone]/22/2018

## 2017-06-26 NOTE — Transfer of Care (Signed)
Immediate Anesthesia Transfer of Care Note  Patient: [name]  Procedure(s) Performed: INSERTION OF TUNNELED  DIALYSIS CATHETER RIGHT INTERNAL JUGULAR (Right ) ARTERIOVENOUS (AV) FISTULA CREATION VERSUS GRAFT RIGHT  ARM (Right )  Patient Location: ICU  Anesthesia Type:MAC  Level of Consciousness: awake, alert , oriented and patient cooperative  Airway & Oxygen Therapy: Patient Spontanous Breathing and Patient connected to nasal cannula oxygen  Post-op Assessment: Report given to RN and Post -op Vital signs reviewed and stable  Post vital signs: Reviewed and stable  Last Vitals:  Vitals:   06/26/17 0822 06/26/17 1100  BP: 109/82 106/77  Pulse: 86 86  Resp: (!) 29 (!) 21  Temp: 36.7 C   SpO2: 92% 96%    Last Pain:  Vitals:   06/26/17 0822  TempSrc: Oral  PainSc:       Patients Stated Pain Goal: 2 (23/34/35 6861)  Complications: No apparent anesthesia complications

## 2017-06-27 ENCOUNTER — Inpatient Hospital Stay (HOSPITAL_COMMUNITY): Payer: BLUE CROSS/BLUE SHIELD

## 2017-06-27 ENCOUNTER — Telehealth: Payer: Self-pay | Admitting: Vascular Surgery

## 2017-06-27 ENCOUNTER — Encounter (HOSPITAL_COMMUNITY): Payer: Self-pay | Admitting: Vascular Surgery

## 2017-06-27 LAB — GLUCOSE, CAPILLARY
GLUCOSE-CAPILLARY: 127 mg/dL — AB (ref 65–99)
GLUCOSE-CAPILLARY: 150 mg/dL — AB (ref 65–99)
GLUCOSE-CAPILLARY: 205 mg/dL — AB (ref 65–99)
Glucose-Capillary: 167 mg/dL — ABNORMAL HIGH (ref 65–99)
Glucose-Capillary: 108 mg/dL — ABNORMAL HIGH (ref 65–99)

## 2017-06-27 LAB — HEPARIN LEVEL (UNFRACTIONATED)
HEPARIN UNFRACTIONATED: 0.28 [IU]/mL — AB (ref 0.30–0.70)
Heparin Unfractionated: 0.45 IU/mL (ref 0.30–0.70)

## 2017-06-27 LAB — RENAL FUNCTION PANEL
ANION GAP: 14 (ref 5–15)
Albumin: 2.4 g/dL — ABNORMAL LOW (ref 3.5–5.0)
BUN: 49 mg/dL — AB (ref 6–20)
CALCIUM: 8.6 mg/dL — AB (ref 8.9–10.3)
CO2: 20 mmol/L — AB (ref 22–32)
Chloride: 96 mmol/L — ABNORMAL LOW (ref 101–111)
Creatinine, Ser: 9.04 mg/dL — ABNORMAL HIGH (ref 0.61–1.24)
GFR calc Af Amer: 7 mL/min — ABNORMAL LOW (ref >60)
GFR calc non Af Amer: 6 mL/min — ABNORMAL LOW (ref >60)
GLUCOSE: 156 mg/dL — AB (ref 65–99)
POTASSIUM: 4.5 mmol/L (ref 3.5–5.1)
Phosphorus: 4.7 mg/dL — ABNORMAL HIGH (ref 2.5–4.6)
SODIUM: 130 mmol/L — AB (ref 135–145)

## 2017-06-27 LAB — CBC
HEMATOCRIT: 29 % — AB (ref 39.0–52.0)
HEMOGLOBIN: 9.6 g/dL — AB (ref 13.0–17.0)
MCH: 26.6 pg (ref 26.0–34.0)
MCHC: 33.1 g/dL (ref 30.0–36.0)
MCV: 80.3 fL (ref 78.0–100.0)
Platelets: 449 10*3/uL — ABNORMAL HIGH (ref 150–400)
RBC: 3.61 MIL/uL — ABNORMAL LOW (ref 4.22–5.81)
RDW: 17.9 % — AB (ref 11.5–15.5)
WBC: 16.3 10*3/uL — ABNORMAL HIGH (ref 4.0–10.5)

## 2017-06-27 LAB — PROTIME-INR
INR: 1.34
PROTHROMBIN TIME: 16.5 s — AB (ref 11.4–15.2)

## 2017-06-27 LAB — LACTATE DEHYDROGENASE: LDH: 276 U/L — AB (ref 98–192)

## 2017-06-27 MED ORDER — HYDRALAZINE HCL 50 MG PO TABS
25.0000 mg | ORAL_TABLET | ORAL | Status: DC
  Administered 2017-06-28 (×3): 25 mg via ORAL
  Filled 2017-06-27 (×2): qty 1

## 2017-06-27 MED ORDER — WARFARIN SODIUM 7.5 MG PO TABS
7.5000 mg | ORAL_TABLET | Freq: Once | ORAL | Status: AC
  Administered 2017-06-27: 7.5 mg via ORAL
  Filled 2017-06-27: qty 1

## 2017-06-27 MED ORDER — HYDRALAZINE HCL 10 MG PO TABS: 10.0000 mg | ORAL_TABLET | ORAL | Status: DC

## 2017-06-27 MED ORDER — PROMETHAZINE HCL 25 MG/ML IJ SOLN: 12.5000 mg | INTRAMUSCULAR | Status: DC | PRN

## 2017-06-27 NOTE — Progress Notes (Signed)
ANTICOAGULATION CONSULT NOTE - Follow Up Consult  Pharmacy Consult for heparin Indication: LVAD, h/o DVT, Factor V Leiden heterozygosity  Labs:  Recent Labs  06/25/17 0349 06/26/17 0305 06/27/17 0418  HGB  --  9.7* 9.6*  HCT  --  29.2* 29.0*  PLT  --  475* 449*  LABPROT 17.6* 17.0* 16.5*  INR 1.46 1.39 1.34  HEPARINUNFRC 0.53 0.39 0.28*  CREATININE 5.56* 7.52*  7.50* 9.04*    Assessment: 51yo male slightly subtherapeutic on heparin after several levels at goal.  Goal of Therapy:  Heparin level 0.3-0.7 units/ml   Plan:  Will increase heparin gtt slightly to 1100 units/hr and check level in [place], PharmD, BCPS  06/27/2017,5:27 AM

## 2017-06-27 NOTE — Progress Notes (Signed)
Physical Therapy Treatment Note  Patient continues to make progress with mobility and is managing LVAD equipment well. Pt was able to ambulate 166f X2 this session and required rest break due to feeling fatigued and SOB. Pt with nausea and emesis beginning of session prior to mobilizing and none throughout rest of session. Patient needs to practice stairs prior to d/c. Continue to progress as tolerated.    06/27/17 1325  PT Visit Information  Last PT Received On 06/27/17  Assistance Needed +1  History of Present Illness 51yo with history of CAD s/p anterior MI in 2010 and chronic systolic CHF (ischemic cardiomyopathy) as well as CKD stage III now s/p HeartMate 3 VAD placement.  PMH includes:  ASCVD,  CHF, Chron's disease, DVT, gastroparesis; HTN, ischemic cardiomyopathy DM; s/p cardiac debriblator placement.   Subjective Data  Patient Stated Goal to go home  Precautions  Precautions Fall;Sternal  Precaution Comments LVAD  Restrictions  Weight Bearing Restrictions Yes (sternal precautions)  Pain Assessment  Pain Assessment Faces  Faces Pain Scale 2  Pain Location chest  Pain Descriptors / Indicators Discomfort;Guarding  Pain Intervention(s) Monitored during session;Repositioned  Cognition  Arousal/Alertness Awake/alert  Behavior During Therapy WFL for tasks assessed/performed  Overall Cognitive Status Within Functional Limits for tasks assessed  Bed Mobility  Overal bed mobility Needs Assistance  Bed Mobility Rolling;Sit to Sidelying  Rolling Supervision  Sit to sidelying Min assist  General bed mobility comments assist to bring bilat LE into bed; cues for sequencing; pt OOB in chair upon arrival  Transfers  Overall transfer level Needs assistance  Equipment used Rolling walker (2 wheeled)  Transfers Sit to/from Stand  Sit to Stand Min guard;Min assist  General transfer comment pt required assist to power up into standing; cues for sequencing  Ambulation/Gait   Ambulation/Gait assistance Min guard  Ambulation Distance (Feet) (1021fX2; one seated rest break due to fatigue)  Assistive device Rolling walker (2 wheeled)  Gait Pattern/deviations Step-through pattern;Wide base of support;Decreased stride length  General Gait Details cues for posture; VSS throughout; safe use of AD and pt does not rely too heavily on UE support  Gait velocity decreased  Balance  Overall balance assessment Needs assistance  Sitting-balance support No upper extremity supported;Feet supported  Sitting balance-[name] Good  Standing balance support Bilateral upper extremity supported  Standing balance-[name] Poor  General Comments  General comments (skin integrity, edema, etc.) pt already switched to batteries upon arrival and pt reported he was able to complete himself; pt is doing well with remembering black bag with backup batteries  PT - End of Session  Equipment Utilized During Treatment Gait belt  Activity Tolerance Patient tolerated treatment well  Patient left with call bell/phone within reach;in bed;with nursing/sitter in room;with family/visitor present  Nurse Communication Mobility status  PT - Assessment/Plan  PT Plan Current plan remains appropriate  PT Visit Diagnosis Unsteadiness on feet (R26.81);Muscle weakness (generalized) (M62.81)  PT Frequency (ACUTE ONLY) Min 3X/week  Follow Up Recommendations Home health PT;Supervision/Assistance - 24 hour  PT equipment Other (comment) (rollator)  AM-PAC PT "6 Clicks" Daily Activity Outcome Measure  Difficulty turning over in bed (including adjusting bedclothes, sheets and blankets)? 4  Difficulty moving from lying on back to sitting on the side of the bed?  1  Difficulty sitting down on and standing up from a chair with arms (e.g., wheelchair, bedside commode, etc,.)? 1  Help needed moving to and from a bed to chair (including a wheelchair)? 3  Help needed walking in hospital room? 3  Help needed  climbing 3-5 steps with a railing?  2  6 Click Score 14  Mobility G Code  CK  PT Goal Progression  Progress towards PT goals Progressing toward goals  Acute Rehab PT Goals  PT Goal Formulation With patient  Time For Goal Achievement 06/28/17  Potential to Achieve Goals Good  PT Time Calculation  PT Start Time (ACUTE ONLY) 0900  PT Stop Time (ACUTE ONLY) 0920  PT Time Calculation (min) (ACUTE ONLY) 20 min  PT General Charges  $$ ACUTE PT VISIT 1 Visit  PT Treatments  $Gait Training 8-22 mins   Earney Navy, PTA Pager: [phone]

## 2017-06-27 NOTE — Procedures (Signed)
I was present at this session.  I have reviewed the session itself and made appropriate changes.  HD via PC . tol full bfr.  Trying for 3 L  Mayla Biddy L 10/23/201812:09 PM

## 2017-06-27 NOTE — Telephone Encounter (Signed)
Sched appt 08/11/17 at 12:45. Lm on hm#.

## 2017-06-27 NOTE — Progress Notes (Addendum)
  Postoperative hemodialysis access     Date of Surgery:  06/26/17 Surgeon: Bridgett Larsson  Subjective:  No complaints; denies any hand pain  PHYSICAL EXAMINATION:  Vitals:   06/27/17 0800 06/27/17 0924  BP: 129/81 116/86  Pulse: 88   Resp: (!) 22   Temp:    SpO2: 92%     Incision is clean and dry Sensation in digits is intact;  There is bruit. The fistula is difficult to palpate   ASSESSMENT/PLAN:  [name] is a 51 y.o. year old male who is s/p right 1st stage brachial vein transposition.  -graft/fistula is patent -pt does not have evidence of steal sx -f/u with Dr. Bridgett Larsson in 4-6 weeks to check maturation of AVF -will sign off-call as needed.   Leontine Locket, PA-C [facility] [phone]  Addendum  I have independently interviewed and examined the patient, and I agree with the physician assistant's findings.  No bleeding from [facility].  Prior subcutaneous tunnel hematoma looks smaller.  +bruit (for LVAD).  No steal sx.  - F/U 6-8 weeks to check on maturation of R 1st stage brachial vein transposition    Adele Barthel, MD, FACS [facility] [place]: [phone] Pager: [phone]  06/27/2017, 10:08 AM

## 2017-06-27 NOTE — Progress Notes (Signed)
Existing VAD dressing removed and site care performed using sterile technique. Drive line exit site cleaned with Chlora prep applicators x 2, allowed to dry, and Gauze dressing with silver re-applied. Exit site healing well, Distal suture removed today, proximal suture intact, the velour is fully implanted at exit site. No redness, tenderness, drainage, foul odor or rash noted. Advance to every other day dressing changes.   [name] RN, BSN VAD Coordinator Pager [phone] (7am - 7am)

## 2017-06-27 NOTE — Progress Notes (Signed)
Per Dr. Aundra Dubin, ok for patient to travel to HD for dialysis.

## 2017-06-27 NOTE — Progress Notes (Signed)
OT Cancellation Note  Patient Details Name: [name] MRN: [id number] DOB: [birth year]   Cancelled Treatment:    Reason Eval/Treat Not Completed: Patient at procedure or test/ unavailable.  Pt still in HD.  Will reattempt .  [place], OTR/L [phone]   Lucille Passy M 06/27/2017, 3:29 PM

## 2017-06-27 NOTE — Progress Notes (Signed)
1 Day Post-Op Procedure(s) (LRB): INSERTION OF TUNNELED  DIALYSIS CATHETER RIGHT INTERNAL JUGULAR (Right) ARTERIOVENOUS (AV) FISTULA CREATION VERSUS GRAFT RIGHT  ARM (Right) Subjective: Patient had a good day CT scan images reviewed There is a moderate left pleural effusion with partial atelectasis of the left lower lobe. The HeartMate 3 pump is in good position No significant pericardial effusion  We'll ask IR to schedule thoracentesis to remove fluid prior to discharge. This can be done with the heparin off for at least 6 hours while Coumadin is being reloaded or after the heparin is stopped when therapeutic INR is reached  Objective: Vital signs in last 24 hours: Temp:  [97.6 F (36.4 C)-98.3 F (36.8 C)] 98.2 F (36.8 C) (10/23 1650) Pulse Rate:  [84-93] 92 (10/23 1700) Cardiac Rhythm: Normal sinus rhythm (10/23 1600) Resp:  [12-37] 25 (10/23 1700) BP: (93-136)/(63-99) 118/98 (10/23 1700) SpO2:  [92 %-99 %] 92 % (10/23 1700) Weight:  [199 lb 8.3 oz (90.5 kg)-206 lb 2.1 oz (93.5 kg)] 199 lb 8.3 oz (90.5 kg) (10/23 1544)  Hemodynamic parameters for last 24 hours:  stable  Intake/Output from previous day: 10/22 0701 - 10/23 0700 In: 217.5 [P.O.:60; I.V.:157.5] Out: 50 [Emesis/NG output:50] Intake/Output this shift: Total I/O In: 230 [P.O.:120; I.V.:110] Out: 3000 [Other:3000]  Alert and comfortable Sternal incision clean and dry Breath sounds diminished at left base Drive line exit site clean and dry  No pedal  edema  Lab Results:  Recent Labs  06/26/17 0305 06/27/17 0418  WBC 17.8* 16.3*  HGB 9.7* 9.6*  HCT 29.2* 29.0*  PLT 475* 449*   BMET:  Recent Labs  06/26/17 0305 06/27/17 0418  NA 129*  130* 130*  K 3.7  3.7 4.5  CL 96*  97* 96*  CO2 24  24 20*  GLUCOSE 149*  150* 156*  BUN 38*  39* 49*  CREATININE 7.52*  7.50* 9.04*  CALCIUM 8.4*  8.4* 8.6*    PT/INR:  Recent Labs  06/27/17 0418  LABPROT 16.5*  INR 1.34   ABG    Component  Value Date/Time   PHART 7.361 06/15/2017 0334   HCO3 23.3 06/15/2017 0334   TCO2 28 06/18/2017 1626   ACIDBASEDEF 2.0 06/15/2017 0334   O2SAT 59.6 06/24/2017 0330   CBG (last 3)   Recent Labs  06/27/17 0339 06/27/17 0746 06/27/17 1647  GLUCAP 167* 150* 108*    Assessment/Plan: S/P Procedure(s) (LRB): INSERTION OF TUNNELED  DIALYSIS CATHETER RIGHT INTERNAL JUGULAR (Right) ARTERIOVENOUS (AV) FISTULA CREATION VERSUS GRAFT RIGHT  ARM (Right) Continue hemodialysis per nephrology Continue with Coumadin load Ultrasound guided left thoracentesis to remove moderate effusion prior to discharge  epicardial pacing wires will be removed when the patient is off heparin prior to discharge   LOS: 20 days    Tharon Aquas Trigt III 06/27/2017

## 2017-06-27 NOTE — Progress Notes (Signed)
[place] for Coumadin/Heparin Indication: LVAD, h/o DVT, Factor V Leiden heterozygosity  Allergies  Allergen Reactions  . Metformin And Related Diarrhea    Patient Measurements: Height: 5' 8"  (172.7 cm) Weight: 206 lb 2.1 oz (93.5 kg) IBW/kg (Calculated) : 68.4   Vital Signs: Temp: 98.3 F (36.8 C) (10/23 1115) Temp Source: Oral (10/23 1115) BP: 134/77 (10/23 1115) Pulse Rate: 89 (10/23 1115)  Labs:  Recent Labs  06/25/17 0349 06/26/17 0305 06/27/17 0418  HGB  --  9.7* 9.6*  HCT  --  29.2* 29.0*  PLT  --  475* 449*  LABPROT 17.6* 17.0* 16.5*  INR 1.46 1.39 1.34  HEPARINUNFRC 0.53 0.39 0.28*  CREATININE 5.56* 7.52*  7.50* 9.04*    Estimated Creatinine Clearance: 10.6 mL/min (A) (by C-G formula based on SCr of 9.04 mg/dL (H)).   Medical History: Past Medical History:  Diagnosis Date  . Angina   . ASCVD (arteriosclerotic cardiovascular disease)    , Anterior infarction 2005, LAD diagonal bifurcation intervention 03/2004  . Automatic implantable cardiac defibrillator -St. Jude's       . Benign neoplasm of colon   . CHF (congestive heart failure) ([place])   . Chronic systolic heart failure ([place])   . Coronary artery disease     Widely patent previously placed stents in the left anterior   . Crohn's disease ([place])   . 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 Dyspnea   . Gastroparesis   . GERD (gastroesophageal reflux disease)   . High cholesterol   . Hyperlipidemia   . Hypersomnolent    Previous diagnosis of narcolepsy  . Hypertension, essential   . Ischemic cardiomyopathy    Ejection fraction 15-20% catheterization 2010  . Type II or unspecified type diabetes mellitus without mention of complication, not stated as uncontrolled   . Unspecified gastritis and gastroduodenitis without mention of hemorrhage     Assessment: 10 yoM on chronic Coumadin for Factor 5 Leiden and hx DVTs admitted for LVAD  placement. Coumadin held 10/19 and heparin started for HD vascular access placement 10/22. Coumadin now resumed 10/22 and INR subtherapeutic as expected at 1.34. Heparin level slightly subtherapeutic this morning, rate adjusted. CBC and LDH stable, no complications noted post-vascular procedure.  Goal of Therapy:  INR 2-3  Heparin Level 0.3-0.7 Monitor platelets by anticoagulation protocol: Yes   Plan:  -Coumadin 7.49m PO x1 tonight -Continue heparin 1100 units/hr for now, recheck level pending -Daily INR, CBC, LDH, heparin level  MArrie Senate PharmD PGY-2 Cardiology Pharmacy Resident Pager: [phone]/23/2018

## 2017-06-27 NOTE — Progress Notes (Signed)
Patient ID: [name], male   DOB: 1966/07/26, 51 y.o.   MRN: [id number]   Advanced Heart Failure VAD Team Note  Subjective:    Admitted 10/3 for RHC/Swan implantation and optimization prior to LVAD.  HeartMate 3 VAD implanted 10/8.  CVVH begun 10/11 CVVH stopped 10/15 Started iHD 10/17 (now TTS) PermCath and R AV fistula placed 06/26/17  Feeling good this am. Didn't walk yesterday with surgery.    Creatinine 9 this am. Now dialysis dependent.   MAP 80s-90s generally, hydralazine started on non-HD days.   LVAD INTERROGATION:  HeartMate 3:  Flow 4.0 liters/min, speed 5400 rpm, power 4.0, PI 4.7. No PI events.   Objective:    Vital Signs:   Temp:  [97.7 F (36.5 C)-98 F (36.7 C)] 97.8 F (36.6 C) (10/23 0000) Pulse Rate:  [84-93] 87 (10/23 0600) Resp:  [15-37] 32 (10/23 0600) BP: (93-131)/(73-99) 93/79 (10/23 0600) SpO2:  [92 %-99 %] 94 % (10/23 0600) Weight:  [205 lb 11 oz (93.3 kg)] 205 lb 11 oz (93.3 kg) (10/23 0630) Last BM Date: 06/25/17 Mean arterial Pressure 90-100s   Intake/Output:   Intake/Output Summary (Last 24 hours) at 06/27/17 0735 Last data filed at 06/27/17 0700  Gross per 24 hour  Intake            217.5 ml  Output               50 ml  Net            167.5 ml    Physical Exam: GENERAL: Well appearing this am. NAD.  HEENT: Normal. NECK: Supple, JVP 8 cm. Carotids OK.  CARDIAC:  Mechanical heart sounds with LVAD hum present.  LUNGS:  CTAB, normal effort.  ABDOMEN:  NT, ND, no HSM. No bruits or masses. +BS  LVAD exit site: Dressing dry and intact. No erythema or drainage. Stabilization device present and accurately applied. Driveline dressing changed daily per sterile technique. EXTREMITIES:  Warm and dry. No cyanosis, clubbing, rash, or edema. R AV fistula site C/D/I NEUROLOGIC:  Alert & oriented x 3. Cranial nerves grossly intact. Moves all 4 extremities w/o difficulty. Affect pleasant      Telemetry   NSR 80-90s, Personally reviewed.     Labs   Basic Metabolic Panel:  Recent Labs Lab 06/22/17 0219 06/22/17 1649 06/23/17 0300 06/24/17 0316 06/25/17 0349 06/26/17 0305 06/27/17 0418  NA 127* 134* 131* 128* 133* 129*  130* 130*  K 4.0 4.0 4.0 4.1 4.2 3.7  3.7 4.5  CL 92* 97* 96* 92* 97* 96*  97* 96*  CO2 24 28 27 24 22 24  24  20*  GLUCOSE 174* 102* 87 185* 187* 149*  150* 156*  BUN 29* 9 16 33* 27* 38*  39* 49*  CREATININE 6.17* 3.16* 4.51* 6.63* 5.56* 7.52*  7.50* 9.04*  CALCIUM 8.0* 7.9* 7.9* 8.2* 8.3* 8.4*  8.4* 8.6*  MG 2.0  --  1.9 2.1 1.8 2.1  --   PHOS 3.4 1.6*  --   --  3.5 3.6 4.7*   Liver Function Tests:  Recent Labs Lab 06/21/17 0215 06/22/17 0219  06/23/17 0300 06/24/17 0316 06/25/17 0349 06/26/17 0305 06/27/17 0418  AST 29 24  --  27 24  --   --   --   ALT 17 15*  --  16* 16*  --   --   --   ALKPHOS 124 115  --  124 136*  --   --   --  BILITOT 1.0 0.7  --  0.6 0.8  --   --   --   PROT 6.2* 6.3*  --  6.3* 7.0  --   --   --   ALBUMIN 2.2* 2.1*  < > 2.2* 2.3* 2.3* 2.3* 2.4*  < > = values in this interval not displayed. No results for input(s): LIPASE, AMYLASE in the last 168 hours. No results for input(s): AMMONIA in the last 168 hours.  CBC:  Recent Labs Lab 06/22/17 1650 06/23/17 0300 06/24/17 0316 06/26/17 0305 06/27/17 0418  WBC 16.6* 16.1* 16.8* 17.8* 16.3*  HGB 9.3* 8.9* 9.3* 9.7* 9.6*  HCT 28.1* 26.7* 28.0* 29.2* 29.0*  MCV 79.6 80.4 80.7 80.4 80.3  PLT 391 406* 446* 475* 449*   INR:  Recent Labs Lab 06/23/17 0300 06/24/17 0316 06/25/17 0349 06/26/17 0305 06/27/17 0418  INR 1.89 1.69 1.46 1.39 1.34   Other results:  Imaging   Dg Chest 2 View  Result Date: 06/27/2017 CLINICAL DATA:  Left ventricular assist device. Shortness of breath . EXAM: CHEST  2 VIEW COMPARISON:  06/26/2017 . FINDINGS: Dialysis catheter noted in unchanged position with tip in the right atrium. Left ventricular assist device in stable position. Cardiac pacer stable position.  Prior median sternotomy. Cardiomegaly. Mild bilateral interstitial prominence noted suggesting mild component CHF. Persistent atelectasis/infiltrate left lung base. Improvement of left-sided pleural effusion. IMPRESSION: 1. Dialysis catheter noted in unchanged position with tip in the right atrium. Left ventricular assist device in stable position. Cardiac pacer in stable position. Prior median sternotomy. 2. Cardiomegaly with mild bilateral interstitial prominence. A mild component of CHF cannot be excluded. Interim improvement of left-sided pleural effusion. 3. Persistent left lower lobe atelectasis/consolidation . Electronically Signed   By: Marcello Moores  Register   On: 06/27/2017 06:40   Dg Chest Port 1 View  Result Date: 06/26/2017 CLINICAL DATA:  End-stage renal disease. EXAM: PORTABLE CHEST 1 VIEW COMPARISON:  Radiograph of June 24, 2017. FINDINGS: Stable cardiomegaly. Left-sided pacemaker and left ventricular assistance device are unchanged in position. No pneumothorax is noted. Mild left basilar atelectasis and effusion is again noted. Right lung is clear. Interval placement of right internal jugular dialysis catheter with distal tip in expected position of right atrium. Bony thorax is unremarkable. IMPRESSION: Mild left basilar atelectasis or effusion is noted. Stable position of left-sided pacemaker and left ventricular assistance device. Stable left basilar atelectasis and effusion is noted. Interval placement of right internal jugular dialysis catheter with distal tip in expected position of right atrium. Electronically Signed   By: Marijo Conception, M.D.   On: 06/26/2017 16:09   Dg Fluoro Guide Cv Line-no Report  Result Date: 06/26/2017 Fluoroscopy was utilized by the requesting physician.  No radiographic interpretation.    Medications:     Scheduled Medications: . amiodarone  200 mg Oral BID  . aspirin EC  325 mg Oral Daily   Or  . aspirin  324 mg Per Tube Daily   Or  . aspirin  300  mg Rectal Daily  . atorvastatin  80 mg Oral q1800  . bisacodyl  10 mg Oral Daily   Or  . bisacodyl  10 mg Rectal Daily  . Chlorhexidine Gluconate Cloth  6 each Topical Daily  . darbepoetin (ARANESP) injection - DIALYSIS  100 mcg Intravenous Q Sat-HD  . docusate sodium  200 mg Oral Daily  . feeding supplement (GLUCERNA SHAKE)  237 mL Oral TID BM  . feeding supplement (PRO-STAT SUGAR FREE 64)  30 mL Oral TID WC  . insulin aspart  0-24 Units Subcutaneous Q4H  . multivitamin  1 tablet Oral QHS  . mupirocin ointment   Nasal BID  . pantoprazole  40 mg Oral Daily  . sildenafil  20 mg Oral TID  . sodium chloride flush  10-40 mL Intracatheter Q12H  . sodium chloride flush  3 mL Intravenous Q12H  . Warfarin - Pharmacist Dosing Inpatient   Does not apply q1800    Infusions: . heparin 1,100 Units/hr (06/27/17 0700)    PRN Medications: heparin, heparin, hydrALAZINE, LORazepam, ondansetron (ZOFRAN) IV, oxyCODONE, promethazine, sodium chloride flush, sodium chloride flush, traMADol  Patient Profile   51 yo with history of CAD s/p anterior MI in 2010 and chronic systolic CHF (ischemic cardiomyopathy) as well as CKD stage III now s/p HeartMate 3 VAD placement.   Assessment/Plan:    1. Acute on chronic systolic CHF: Ischemic cardiomyopathy. St Jude ICD. NSTEMI in March 2018 with DES to LAD and RCA, complicated by cardiogenic shock, low output requiring milrinone. Most recent echo 8/18 with EF 20-25%, moderate MR. He was admitted and started on milrinone with improved creatinine.  HeartMate 3 VAD placement on 10/8. Speed cut back to 5400 with onset of afib/RVR 10/11.    - Milrinone stopped 10/19. No central line to check co-ox. Will continue sildenafil for RV support.   - Coumadin resumed last night. INR 1.34. INR Goal 2-3 (aim slightly higher with h/o Factor V Leiden heterozygosity) and ASA 325. Will not lower aspirin dose with Factor V Leiden. D/w PharmD  - MAP into 90s overnight.  Previously  discussed with Dr. Jimmy Footman. Ordered for hydralazine on OFF dialysis days.  - Will eventually need ramp echo prior to discharge => plan for noon tomorrow.  - Continue to ambulate. Continue VAD education  - VAD parameters reviewed personally and stable at this time.  2. AKI on CKD:  - Seen by Dr. Marval Regal. Still no renal recovery.  Minimal UOP. Has had HD x 3, now on TTS schedule. Tolerated HD well last week.  - Permcath and AV fistula placed 06/26/17.  - INR 1.34 this am. Coumadin resumed last night.  - Outpatient HD ideally at [facility] where staff has been educated for LVAD.  3. CAD: NSTEMI in 3/18. LHC with 99% ulcerated lesion proximal RCA with left to right collaterals, 95% mid LAD stenosis after mid LAD stent. s/p PCI to RCA and LAD on 11/25/16.  - No s/s of ischemia.    - Off plavix for LVAD.   - Continue statin.   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 Recent V/Q scan did not show acute or chronic PE.  He is a heterozygote for Factor V Leiden, plan for ASA 325 and warfarin INR 2-3 with LVAD. No change.  5. Chronic N/V/D with gastroparesis:  -Tolerating diet.  Has occasional nausea.  No change.  6. DM: - On Jardiance at home, will cover with sliding scale. Glucose stable.  7. S/P Multiple Tooth Extractions 4-15, 19-23, and 27-29.  - Stable. 8. Atrial fibrillation:  - Paroxysmal. In NSR.   - Continue amio 200 mg BID.  9. ID: CXR with LLL atelectasis versus infiltrate. Afebrile.   - He is off vancomycin, zosyn stopped 10/20 10. Hyponatremia:  - Na 130 today.  - Continue to follow. Free water restrict.  11. Anemia:  - Hgb stable at 9.6  I reviewed the LVAD parameters from today, and compared the results to the patient's  prior recorded data.  No programming changes were made.  The LVAD is functioning within specified parameters.  The patient performs LVAD self-test daily.  LVAD interrogation was negative for any significant power changes, alarms or PI events/speed drops.   LVAD equipment check completed and is in good working order.  Back-up equipment present.   LVAD education done on emergency procedures and precautions and reviewed exit site care.   Shirley Friar, PA-C  06/27/2017 7:35 AM   Patient seen with PA, agree with the above note.  Minimal renal recovery, will have HD today.  Now has permanent access.  MAP elevated, we have restarted hydralazine 25 mg tid on non-HD days.  Would like to keep MAP 70-90 range.  HD today will likely bring down BP.   Warfarin restarted with heparin bridge.  Can stop heparin with INR > 1.8 (goal 2-3 for this patient).   LVAD parameters stable.   Needs to walk in halls.   Daegen Berrocal 06/27/2017 7:50 AM

## 2017-06-27 NOTE — Progress Notes (Signed)
OT Cancellation Note  Patient Details Name: [name] MRN: [id number] DOB: 11-12-1965   Cancelled Treatment:    Reason Eval/Treat Not Completed: Patient at procedure or test/ unavailable.   Pt in HD.  Will reattempt.  [place], OTR/L [phone]   Lucille Passy M 06/27/2017, 1:24 PM

## 2017-06-27 NOTE — Progress Notes (Signed)
LVAD Coordinator Rounding Note:  Admitted 06/07/2017 for hemodynamic optimization prior to LVAD placement. Full dental extractions prior to LVAD implant.  HM3LVAD implanted on 10/8/2018by Dr Craige Cotta Destination Therapycriteria due to renal insufficiency.  Vital signs: Temp: 97.6  HR: 88 Doppler:  [id number] systolic Auto cuff: 810/17 (95) O2 Sat: 96% on RA  Wt:194>217>220>220>226>213>207>199>198 >198>199>201>207>204>205lbs   LVAD interrogation reveals:   Speed: 5400 Flow:  4.2 Power: 4.0w PI: 4.2 Alarms: none Events: none Hematocrit: 29 Fixed speed: 5400 Low speed limit: 5100   Drive Line: Daily. Change daily per protocol using daily kits and silver strip. VAD Coordinator will change today.  Labs:  LDH trend:255>343>379>376>366>314>304>286>284>257>276  INR trend: 1.44>1.23>1.44>1.47>1.93>2.41>2.13>1.72>1.89>1.39>1.34  Hgb trend: 9.6>8.8>8.1>8.6>8.8>8.6>8.1>8.0>7.7>8.9>9.7>9.6  Anticoagulation Plan: -INR Goal: 2-3, warfarin restarted 10/22  -ASA Dose: 369m (will remain on this with factor V heterozygosity) -Heparin level 0.3 - 0.7  Blood Products:  - 2 units FFP in OR - 1 unit PC in OR - 1 unit PRBC's 10/12 - 1 unit PRBC's 10/18  NO: off 06/19/17  Gtts: - Milrinone 0.25 mcg/mg/min - wean to 0.125 today off 10/18 - Heparin 1050u/hr  Device: -St Jude  -Therapies: off. Perm pacer set at 60 bpm. -Temp pacer set AAI at 88 bpm, MA-5  Arrythmias: Atrial fib started 10/12. Amiodarone started 06/12/17 initially for ventricular arrhythmia   Respiratory: extubated 10/9   Renal:  Creat trend:  1.62>2.75>4.04>3.71>>2.56>2.44>2.52>4.52>4.4>6.17 - Oliguric renal failure with rising CVP - Trialysis cath placed 06/16/17 and CVVHD started goal 100 cc/hr removal for increased wt, CVP - Stop CVVHD 06/19/17 and plan for intermittent hemodialysis while pt oliguric -  06/20/17 - HD -  06/22/17 - HD later today  Adverse Events on  VAD: -Oliguric renal failure post implant with initiation of CVVHD and HD  Patient Education: Patient switching from batteries to power module without difficulty. Pt performed self test this am on his controller without difficulty. Walked in hallway x 2 this am. Pt does c/o of decreased appetite and bouts of nausea. He is drinking his Glucerna shakes x 3 daily. Informed him to try and eat smaller, more frequent meals until appetite returns, and stressed importance of protein intake to promote wound healing. Pt verbalized understanding of same.   Plan/Recommendations:  1. Continue daily dressing changes with family education. 2. Asked patient and caregivers to complete reading HM III Patient Manual prior to discharge teaching session 3. We will need to establish the pt site where [name] will be for dialysis. They may need VAD training before they will accept the pt. This education process takes time and needs to be started early. 4. Call VAD Coordinator if any questions re: VAD or VAD equipment.   STanda RockersRN, VAD Coordinator 24/7 pager [phone]

## 2017-06-27 NOTE — Progress Notes (Signed)
[place] for Heparin Indication: LVAD, h/o DVT, Factor V Leiden heterozygosity  Allergies  Allergen Reactions  . Metformin And Related Diarrhea    Patient Measurements: Height: 5' 8"  (172.7 cm) Weight: 199 lb 8.3 oz (90.5 kg) IBW/kg (Calculated) : 68.4   Vital Signs: Temp: 98.2 F (36.8 C) (10/23 1650) Temp Source: Oral (10/23 1650) BP: 118/98 (10/23 1700) Pulse Rate: 92 (10/23 1700)  Labs:  Recent Labs  06/25/17 0349 06/26/17 0305 06/27/17 0418 06/27/17 1823  HGB  --  9.7* 9.6*  --   HCT  --  29.2* 29.0*  --   PLT  --  475* 449*  --   LABPROT 17.6* 17.0* 16.5*  --   INR 1.46 1.39 1.34  --   HEPARINUNFRC 0.53 0.39 0.28* 0.45  CREATININE 5.56* 7.52*  7.50* 9.04*  --     Estimated Creatinine Clearance: 10.4 mL/min (A) (by C-G formula based on SCr of 9.04 mg/dL (H)).   Medical History: Past Medical History:  Diagnosis Date  . Angina   . ASCVD (arteriosclerotic cardiovascular disease)    , Anterior infarction 2005, LAD diagonal bifurcation intervention 03/2004  . Automatic implantable cardiac defibrillator -St. Jude's       . Benign neoplasm of colon   . CHF (congestive heart failure) ([place])   . Chronic systolic heart failure ([place])   . Coronary artery disease     Widely patent previously placed stents in the left anterior   . Crohn's disease ([place])   . 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 Dyspnea   . Gastroparesis   . GERD (gastroesophageal reflux disease)   . High cholesterol   . Hyperlipidemia   . Hypersomnolent    Previous diagnosis of narcolepsy  . Hypertension, essential   . Ischemic cardiomyopathy    Ejection fraction 15-20% catheterization 2010  . Type II or unspecified type diabetes mellitus without mention of complication, not stated as uncontrolled   . Unspecified gastritis and gastroduodenitis without mention of hemorrhage     Assessment: 31 yoM on chronic Coumadin for Factor 5  Leiden and hx DVTs admitted for LVAD placement. Coumadin held 10/19 and heparin started for HD vascular access placement 10/22. Coumadin now resumed 10/22 and INR subtherapeutic as expected at 1.34. CBC and LDH stable, no complications noted post-vascular procedure.  Heparin level at goal this evening on heparin at 1100 units/hr.  No overt bleeding or complications noted.  Goal of Therapy:  INR 2-3  Heparin Level 0.3-0.7 Monitor platelets by anticoagulation protocol: Yes   Plan:  -Continue heparin 1100 units/hr. -Daily INR, CBC, LDH, heparin level  Uvaldo Rising, BCPS  Clinical Pharmacist Pager [phone]  06/27/2017 7:46 PM

## 2017-06-27 NOTE — Telephone Encounter (Signed)
-----   Message from Mena Goes, RN sent at 06/26/2017  5:03 PM EDT ----- Regarding: 6-8 weeks    ----- Message ----- From: Conrad Fairfield, MD Sent: 06/26/2017   3:30 PM To: Vvs Charge Pool  [name] [id number] 1966/08/22  PROCEDURE: 1.  Right internal jugular vein tunneled dialysis catheter placement 2.  Right internal jugular vein cannulation under ultrasound guidance 3.  Right first stage brachial vein transposition   Asst: Linus Orn, CSA  Follow-up: 6-8 weeks

## 2017-06-27 NOTE — Progress Notes (Signed)
Subjective: Interval History: has no complaint . Breathing ok .  Objective: Vital signs in last 24 hours: Temp:  [97.7 F (36.5 C)-98 F (36.7 C)] 97.8 F (36.6 C) (10/23 0000) Pulse Rate:  [84-93] 87 (10/23 0600) Resp:  [15-37] 32 (10/23 0600) BP: (93-131)/(73-99) 93/79 (10/23 0600) SpO2:  [92 %-99 %] 94 % (10/23 0600) Weight:  [93.3 kg (205 lb 11 oz)] 93.3 kg (205 lb 11 oz) (10/23 0630) Weight change: 0.5 kg (1 lb 1.6 oz)  Intake/Output from previous day: 10/22 0701 - 10/23 0700 In: 217.5 [P.O.:60; I.V.:157.5] Out: 50 [Emesis/NG output:50] Intake/Output this shift: No intake/output data recorded.  General appearance: alert, cooperative, no distress and mildly obese Resp: rales bibasilar Chest wall: RIJ cath Cardio: cont hum of pump GI: soft, liver down 6 cm soft Extremities: edema 1-2+  Lab Results:  Recent Labs  06/26/17 0305 06/27/17 0418  WBC 17.8* 16.3*  HGB 9.7* 9.6*  HCT 29.2* 29.0*  PLT 475* 449*   BMET:  Recent Labs  06/26/17 0305 06/27/17 0418  NA 129*  130* 130*  K 3.7  3.7 4.5  CL 96*  97* 96*  CO2 24  24 20*  GLUCOSE 149*  150* 156*  BUN 38*  39* 49*  CREATININE 7.52*  7.50* 9.04*  CALCIUM 8.4*  8.4* 8.6*   No results for input(s): PTH in the last 72 hours. Iron Studies: No results for input(s): IRON, TIBC, TRANSFERRIN, FERRITIN in the last 72 hours.  Studies/Results: Dg Chest 2 View  Result Date: 06/27/2017 CLINICAL DATA:  Left ventricular assist device. Shortness of breath . EXAM: CHEST  2 VIEW COMPARISON:  06/26/2017 . FINDINGS: Dialysis catheter noted in unchanged position with tip in the right atrium. Left ventricular assist device in stable position. Cardiac pacer stable position. Prior median sternotomy. Cardiomegaly. Mild bilateral interstitial prominence noted suggesting mild component CHF. Persistent atelectasis/infiltrate left lung base. Improvement of left-sided pleural effusion. IMPRESSION: 1. Dialysis catheter noted in  unchanged position with tip in the right atrium. Left ventricular assist device in stable position. Cardiac pacer in stable position. Prior median sternotomy. 2. Cardiomegaly with mild bilateral interstitial prominence. A mild component of CHF cannot be excluded. Interim improvement of left-sided pleural effusion. 3. Persistent left lower lobe atelectasis/consolidation . Electronically Signed   By: Marcello Moores  Register   On: 06/27/2017 06:40   Dg Chest Port 1 View  Result Date: 06/26/2017 CLINICAL DATA:  End-stage renal disease. EXAM: PORTABLE CHEST 1 VIEW COMPARISON:  Radiograph of June 24, 2017. FINDINGS: Stable cardiomegaly. Left-sided pacemaker and left ventricular assistance device are unchanged in position. No pneumothorax is noted. Mild left basilar atelectasis and effusion is again noted. Right lung is clear. Interval placement of right internal jugular dialysis catheter with distal tip in expected position of right atrium. Bony thorax is unremarkable. IMPRESSION: Mild left basilar atelectasis or effusion is noted. Stable position of left-sided pacemaker and left ventricular assistance device. Stable left basilar atelectasis and effusion is noted. Interval placement of right internal jugular dialysis catheter with distal tip in expected position of right atrium. Electronically Signed   By: Marijo Conception, M.D.   On: 06/26/2017 16:09   Dg Fluoro Guide Cv Line-no Report  Result Date: 06/26/2017 Fluoroscopy was utilized by the requesting physician.  No radiographic interpretation.    I have reviewed the patient's current medications.  Assessment/Plan: 1 ESRD HD today, vol xs, ^ solute. Will lower today.  Had access placed yest.  BVT 1st stage, PC 2 DM  conrollled 3 Anemia stable cont esa, check Fe 4 HPTH check 5 CM per cards. LVAD caution with bp and vol 6 Hx DVTs P HD, esa, check labs    LOS: 20 days   [name] L 06/27/2017,7:18 AM

## 2017-06-28 ENCOUNTER — Encounter (HOSPITAL_COMMUNITY): Payer: BLUE CROSS/BLUE SHIELD

## 2017-06-28 ENCOUNTER — Telehealth (HOSPITAL_COMMUNITY): Payer: Self-pay | Admitting: Pharmacist

## 2017-06-28 ENCOUNTER — Encounter (HOSPITAL_COMMUNITY): Payer: Self-pay | Admitting: Student

## 2017-06-28 ENCOUNTER — Inpatient Hospital Stay (HOSPITAL_COMMUNITY): Payer: BLUE CROSS/BLUE SHIELD

## 2017-06-28 DIAGNOSIS — I361 Nonrheumatic tricuspid (valve) insufficiency: Secondary | ICD-10-CM

## 2017-06-28 HISTORY — PX: IR THORACENTESIS ASP PLEURAL SPACE W/IMG GUIDE: IMG5380

## 2017-06-28 LAB — CBC
HEMATOCRIT: 28.2 % — AB (ref 39.0–52.0)
HEMOGLOBIN: 9.1 g/dL — AB (ref 13.0–17.0)
MCH: 26.1 pg (ref 26.0–34.0)
MCHC: 32.3 g/dL (ref 30.0–36.0)
MCV: 80.8 fL (ref 78.0–100.0)
Platelets: 441 10*3/uL — ABNORMAL HIGH (ref 150–400)
RBC: 3.49 MIL/uL — AB (ref 4.22–5.81)
RDW: 18 % — ABNORMAL HIGH (ref 11.5–15.5)
WBC: 14.4 10*3/uL — AB (ref 4.0–10.5)

## 2017-06-28 LAB — RENAL FUNCTION PANEL
ALBUMIN: 2.3 g/dL — AB (ref 3.5–5.0)
Anion gap: 13 (ref 5–15)
BUN: 28 mg/dL — AB (ref 6–20)
CALCIUM: 8.6 mg/dL — AB (ref 8.9–10.3)
CHLORIDE: 94 mmol/L — AB (ref 101–111)
CO2: 24 mmol/L (ref 22–32)
CREATININE: 6.13 mg/dL — AB (ref 0.61–1.24)
GFR calc Af Amer: 11 mL/min — ABNORMAL LOW (ref >60)
GFR, EST NON AFRICAN AMERICAN: 9 mL/min — AB (ref >60)
Glucose, Bld: 158 mg/dL — ABNORMAL HIGH (ref 65–99)
PHOSPHORUS: 4.2 mg/dL (ref 2.5–4.6)
Potassium: 3.6 mmol/L (ref 3.5–5.1)
Sodium: 131 mmol/L — ABNORMAL LOW (ref 135–145)

## 2017-06-28 LAB — IRON AND TIBC
IRON: 15 ug/dL — AB (ref 45–182)
Saturation Ratios: 6 % — ABNORMAL LOW (ref 17.9–39.5)
TIBC: 251 ug/dL (ref 250–450)
UIBC: 236 ug/dL

## 2017-06-28 LAB — PTH, INTACT AND CALCIUM
Calcium, Total (PTH): 8.7 mg/dL (ref 8.7–10.2)
PTH: 168 pg/mL — ABNORMAL HIGH (ref 15–65)

## 2017-06-28 LAB — GLUCOSE, CAPILLARY
GLUCOSE-CAPILLARY: 156 mg/dL — AB (ref 65–99)
GLUCOSE-CAPILLARY: 156 mg/dL — AB (ref 65–99)
GLUCOSE-CAPILLARY: 177 mg/dL — AB (ref 65–99)
Glucose-Capillary: 204 mg/dL — ABNORMAL HIGH (ref 65–99)
Glucose-Capillary: 123 mg/dL — ABNORMAL HIGH (ref 65–99)
Glucose-Capillary: 149 mg/dL — ABNORMAL HIGH (ref 65–99)

## 2017-06-28 LAB — ECHOCARDIOGRAM LIMITED
Height: 68 in
WEIGHTICAEL: 3269.86 [oz_av]

## 2017-06-28 LAB — LACTATE DEHYDROGENASE: LDH: 245 U/L — ABNORMAL HIGH (ref 98–192)

## 2017-06-28 LAB — PROTIME-INR
INR: 1.48
Prothrombin Time: 17.8 seconds — ABNORMAL HIGH (ref 11.4–15.2)

## 2017-06-28 LAB — HEPARIN LEVEL (UNFRACTIONATED): HEPARIN UNFRACTIONATED: 0.25 [IU]/mL — AB (ref 0.30–0.70)

## 2017-06-28 MED ORDER — WARFARIN SODIUM 5 MG PO TABS
5.0000 mg | ORAL_TABLET | Freq: Once | ORAL | Status: AC
  Administered 2017-06-28: 5 mg via ORAL
  Filled 2017-06-28: qty 1

## 2017-06-28 MED ORDER — HEPARIN (PORCINE) IN NACL 100-0.45 UNIT/ML-% IJ SOLN
1200.0000 [IU]/h | INTRAMUSCULAR | Status: DC
  Administered 2017-06-28: 1200 [IU]/h via INTRAVENOUS
  Filled 2017-06-28: qty 250

## 2017-06-28 MED ORDER — LIDOCAINE HCL 1 % IJ SOLN
INTRAMUSCULAR | Status: AC
  Filled 2017-06-28: qty 20

## 2017-06-28 MED ORDER — DARBEPOETIN ALFA 150 MCG/0.3ML IJ SOSY
150.0000 ug | PREFILLED_SYRINGE | INTRAMUSCULAR | Status: DC
  Administered 2017-07-01: 150 ug via INTRAVENOUS
  Filled 2017-06-28: qty 0.3

## 2017-06-28 MED ORDER — SODIUM CHLORIDE 0.9 % IV SOLN
125.0000 mg | INTRAVENOUS | Status: DC
  Administered 2017-06-29 – 2017-07-01 (×2): 125 mg via INTRAVENOUS
  Filled 2017-06-28 (×4): qty 10

## 2017-06-28 MED ORDER — HYDRALAZINE HCL 50 MG PO TABS
25.0000 mg | ORAL_TABLET | ORAL | Status: DC
  Filled 2017-06-28: qty 1

## 2017-06-28 MED ORDER — LIDOCAINE HCL (PF) 1 % IJ SOLN
INTRAMUSCULAR | Status: DC | PRN
  Administered 2017-06-28: 10 mL

## 2017-06-28 NOTE — Progress Notes (Signed)
Speed  Flow  PI  Power  LVIDD  AI  Aortic openings  MR  TR  Septum  RV   5400 4.3 3.1 4.0 5.1  7/7 open trace  mild midline  Mild HK    5500 4.3 3.3 4.1 5.3  6/7 open trace mild midline Mild HK   5600 4.5 3.1 4.3 5.1  6/7 open trace mild midline Mod HK   5700 4.7 3.2 4.4 4.6  4/7 open trace mild Bows towards left Vent Mod HK                                Ramp ECHO performed at bedside.  At completion of ramp study, patients  controller programmed:  Fixed speed: 5600 Low speed limit: 5300   Balinda Quails RN, VAD Coordinator 24/7 pager [phone]

## 2017-06-28 NOTE — Progress Notes (Signed)
ANTICOAGULATION CONSULT NOTE - Follow Up Consult  Pharmacy Consult for heparin Indication: LVAD, h/o DVT, Factor V Leiden heterozygosity  Labs:  Recent Labs  06/26/17 0305 06/27/17 0418 06/27/17 1823 06/28/17 0505  HGB 9.7* 9.6*  --  9.1*  HCT 29.2* 29.0*  --  28.2*  PLT 475* 449*  --  441*  LABPROT 17.0* 16.5*  --  17.8*  INR 1.39 1.34  --  1.48  HEPARINUNFRC 0.39 0.28* 0.45 0.25*  CREATININE 7.52*  7.50* 9.04*  --  6.13*    Assessment: 51yo male slightly subtherapeutic on heparin after one level at goal.  Goal of Therapy:  Heparin level 0.3-0.7 units/ml   Plan:  Will increase heparin gtt slightly to 1200 units/hr and check level in [place], PharmD, BCPS  06/28/2017,6:35 AM

## 2017-06-28 NOTE — Progress Notes (Addendum)
[place] for Coumadin/Heparin Indication: LVAD, h/o DVT, Factor V Leiden heterozygosity  Allergies  Allergen Reactions  . Metformin And Related Diarrhea    Patient Measurements: Height: 5' 8"  (172.7 cm) Weight: 204 lb 5.9 oz (92.7 kg) (with vest, batteries, and controller) IBW/kg (Calculated) : 68.4   Vital Signs: Temp: 98.1 F (36.7 C) (10/24 0700) Temp Source: Oral (10/24 0700) BP: 108/89 (10/24 0800) Pulse Rate: 86 (10/24 0800)  Labs:  Recent Labs  06/26/17 0305 06/27/17 0418 06/27/17 1823 06/28/17 0505  HGB 9.7* 9.6*  --  9.1*  HCT 29.2* 29.0*  --  28.2*  PLT 475* 449*  --  441*  LABPROT 17.0* 16.5*  --  17.8*  INR 1.39 1.34  --  1.48  HEPARINUNFRC 0.39 0.28* 0.45 0.25*  CREATININE 7.52*  7.50* 9.04*  --  6.13*    Estimated Creatinine Clearance: 15.6 mL/min (A) (by C-G formula based on SCr of 6.13 mg/dL (H)).   Medical History: Past Medical History:  Diagnosis Date  . Angina   . ASCVD (arteriosclerotic cardiovascular disease)    , Anterior infarction 2005, LAD diagonal bifurcation intervention 03/2004  . Automatic implantable cardiac defibrillator -[employer]. Jude's       . Benign neoplasm of colon   . CHF (congestive heart failure) ([place])   . Chronic systolic heart failure ([place])   . Coronary artery disease     Widely patent previously placed stents in the left anterior   . Crohn's disease ([place])   . 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 Dyspnea   . Gastroparesis   . GERD (gastroesophageal reflux disease)   . High cholesterol   . Hyperlipidemia   . Hypersomnolent    Previous diagnosis of narcolepsy  . Hypertension, essential   . Ischemic cardiomyopathy    Ejection fraction 15-20% catheterization 2010  . Type II or unspecified type diabetes mellitus without mention of complication, not stated as uncontrolled   . Unspecified gastritis and gastroduodenitis without mention of hemorrhage      Assessment: 57 yoM on chronic Coumadin for Factor 5 Leiden and hx DVTs admitted for LVAD placement. Coumadin held 10/19 and heparin started for HD vascular access placement 10/22. Coumadin now resumed 10/22 and INR subtherapeutic as expected but trending up nicely to 1.38.  Pt to undergo thoracentesis this afternoon at 1400 and will need to be off heparin x6 hours. Per Dr. Prescott Gum, okay to resume heparin with no bolus two hours after procedure.  Goal of Therapy:  INR 2-3  Heparin Level 0.3-0.7 Monitor platelets by anticoagulation protocol: Yes   Plan:  -Coumadin 48m PO x1 tonight -Hold heparin, resume at 1200 units/hr with no bolus 2 hr after procedure -Daily INR, CBC, LDH, heparin level  MArrie Senate PharmD PGY-2 Cardiology Pharmacy Resident Pager: [phone]/24/2018

## 2017-06-28 NOTE — Progress Notes (Signed)
  Echocardiogram 2D Echocardiogram has been performed.  [name] 06/28/2017, 12:47 PM

## 2017-06-28 NOTE — Progress Notes (Addendum)
LVAD Coordinator Rounding Note:  Admitted 06/07/2017 for hemodynamic optimization prior to LVAD placement.   HM3LVAD implanted on 10/8/2018by Dr Craige Cotta [facility]  Vital signs: Temp: 98.1 HR: 86 Doppler: 90 Auto cuff: 108/89 (97) O2 Sat: 96% on RA  Wt:194>217>220>220>226>213>207>199>198 >198>199>201>207>204>205>204lbs   LVAD interrogation reveals:  Speed: 5400 Flow:  4.1 Power: 4.0w PI: 4 Alarms: none Events: none Hematocrit: 29 Fixed speed: 5400 Low speed limit: 5100   Drive Line: Daily. Change every other day per protocolusing daily kits and silver strip. Due 06/29/17  Labs:  LDH trend:255>343>379>376>366>314>304>286>284>257>276>245  INR trend: 1.44>1.23>1.44>1.47>1.93>2.41>2.13>1.72>1.89>1.39>1.34>1.48  Hgb trend: 9.6>8.8>8.1>8.6>8.8>8.6>8.1>8.0>7.7>8.9>9.7>9.6>9.1  Anticoagulation Plan: -INR Goal: 2-3, warfarin restarted 10/22  -ASA Dose: 322m (will remain on this with factor V heterozygosity) -Heparin level 0.3 - 0.7  Blood Products:  - 2 units FFP in OR - 1 unit PCin OR - 1 unit PRBC's 10/12 - 1 unit PRBC's 10/18   Gtts: - Milrinone 0.25 mcg/mg/min - wean to 0.125 today, off 10/18 - Heparin 1050u/hr- off for Thoracentesis today  Device: -St Jude  -Therapies: off. Perm pacer set at 60 bpm. -Temp pacer set AAI at 88 bpm, MA-5   Renal:  Creat trend:  1.62>2.75>4.04>3.71>>2.56>2.44>2.52>4.52>4.4>6.17>6.13 -CVVHD 10/12-10/15 -Intermittent HD started 10/16   Plan/Recommendations:  1. Continue every other day dressing changes and involve caregivers as possible. Due 06/29/17. 2. Patient plans to arrange family meeting with caregivers for VAD education to be completed Thursday or Friday.  3. Spoke with HD outpatient coordinator. [facility], [facility], per patient request. This will accommodate his transportation. VAD education has been  completed at this center. 4. Call VAD Coordinator if any questions re: VAD or VAD equipment.  5. Patient can be transferred to 2 Central 6. Ramp Echo scheduled for 1200 7. Thoracentesis in IR scheduled for 1400.   LBalinda QuailsRN, VAD Coordinator 24/7 pager [phone]

## 2017-06-28 NOTE — Progress Notes (Signed)
Pharmacist Heart Failure Core Measure Documentation  Assessment: [name] has an EF documented as 20-25% on 04/06/17 by Echo.  Rationale: Heart failure patients with left ventricular systolic dysfunction (LVSD) and an EF < 40% should be prescribed an angiotensin converting enzyme inhibitor (ACEI) or angiotensin receptor blocker (ARB) at discharge unless a contraindication is documented in the medical record.  This patient is not currently on an ACEI or ARB for HF.  This note is being placed in the record in order to provide documentation that a contraindication to the use of these agents is present for this encounter.  ACE Inhibitor or Angiotensin Receptor Blocker is contraindicated (specify all that apply)  []   ACEI allergy AND ARB allergy []   Angioedema []   Moderate or severe aortic stenosis []   Hyperkalemia []   Hypotension []   Renal artery stenosis [x]   Worsening renal function, preexisting renal disease or dysfunction  Hildred Laser, Pharm D 06/28/2017 11:31 AM

## 2017-06-28 NOTE — Progress Notes (Signed)
CSW met with patient at bedside. Patient in good spirits although raised concern about his lack of desire to eat. CSW and LVAD Coordinator walked the hall with patient for a short time and discussed strategies, referral for dietician and coping mechanisms. Patient admitted that he has a lot on his plate with teeth removed, LVAD implant and now dialysis. Discussed and provided coping techniques and supportive intervention around adjustment to illness and life with a VAD. Patient verbalizes understanding and will continue to resource his coping strategies. CSW will continue to follow for support and any needs as identified throughout hospitalization. Raquel Sarna, [place], [place]

## 2017-06-28 NOTE — Progress Notes (Signed)
Subjective: Interval History: has no complaint , a lot of diet questions.  Objective: Vital signs in last 24 hours: Temp:  [97.6 F (36.4 C)-98.8 F (37.1 C)] 98.1 F (36.7 C) (10/24 0300) Pulse Rate:  [87-98] 88 (10/24 0600) Resp:  [12-29] 24 (10/24 0600) BP: (89-136)/(63-98) 114/82 (10/24 0600) SpO2:  [88 %-99 %] 96 % (10/24 0600) Weight:  [90.5 kg (199 lb 8.3 oz)-93.5 kg (206 lb 2.1 oz)] 92.7 kg (204 lb 5.9 oz) (10/24 0500) Weight change: 0.2 kg (7.1 oz)  Intake/Output from previous day: 10/23 0701 - 10/24 0700 In: 814 [P.O.:560; I.V.:254] Out: 3000  Intake/Output this shift: No intake/output data recorded.  General appearance: alert, cooperative, no distress and mildly obese Resp: rales LLL Chest wall: RIJ PC Cardio: cont hum of LVAD GI: pos bs, liver down 6 cm Extremities: edema 1+, and AVF RUA  Lab Results:  Recent Labs  06/27/17 0418 06/28/17 0505  WBC 16.3* 14.4*  HGB 9.6* 9.1*  HCT 29.0* 28.2*  PLT 449* 441*   BMET:  Recent Labs  06/27/17 0418 06/28/17 0505  NA 130* 131*  K 4.5 3.6  CL 96* 94*  CO2 20* 24  GLUCOSE 156* 158*  BUN 49* 28*  CREATININE 9.04* 6.13*  CALCIUM 8.6* 8.6*   No results for input(s): PTH in the last 72 hours. Iron Studies:  Recent Labs  06/27/17 1152  IRON 15*  TIBC 251    Studies/Results: Dg Chest 2 View  Result Date: 06/27/2017 CLINICAL DATA:  Left ventricular assist device. Shortness of breath . EXAM: CHEST  2 VIEW COMPARISON:  06/26/2017 . FINDINGS: Dialysis catheter noted in unchanged position with tip in the right atrium. Left ventricular assist device in stable position. Cardiac pacer stable position. Prior median sternotomy. Cardiomegaly. Mild bilateral interstitial prominence noted suggesting mild component CHF. Persistent atelectasis/infiltrate left lung base. Improvement of left-sided pleural effusion. IMPRESSION: 1. Dialysis catheter noted in unchanged position with tip in the right atrium. Left ventricular  assist device in stable position. Cardiac pacer in stable position. Prior median sternotomy. 2. Cardiomegaly with mild bilateral interstitial prominence. A mild component of CHF cannot be excluded. Interim improvement of left-sided pleural effusion. 3. Persistent left lower lobe atelectasis/consolidation . Electronically Signed   By: Marcello Moores  Register   On: 06/27/2017 06:40   Ct Chest Without Contrast  Result Date: 06/27/2017 CLINICAL DATA:  51 year old male with LVAD placement. Evaluate position. Subsequent encounter. EXAM: CT CHEST WITHOUT CONTRAST TECHNIQUE: Multidetector CT imaging of the chest was performed following the standard protocol without IV contrast. COMPARISON:  06/27/2017 chest x-ray.  04/28/2017 chest CT. FINDINGS: Cardiovascular: Evaluation limited by lack of contrast. Additionally significant streak artifact from left ventricular assist device. Inflow cannula at the apex of the left ventricle is difficult to assess secondary to significant streak artifact. Outflow catheter ascending aortic level. Cardiomegaly. AICD/sequential pacer in place with leads in the region right atrium and right ventricle. Right dialysis catheter extends through the right atrium with the tip at the level of the inferior vena cava/right atrium junction. Prominent 3 vessel coronary artery calcification. Partial calcification ascending thoracic aorta. Mediastinum/Nodes: No adenopathy. Lungs/Pleura: Pre-surgical identified pulmonary nodules not well delineated on the present post surgical exam. Follow-up as previously noted. Interval development of moderately large left-sided pleural effusion and lingula/left lower lobe consolidation which may represent passive atelectasis with air bronchograms. The left-sided pleural effusion does not appear significantly dense as may be expected with acute blood from leak secondary to LVAD surgery. Correlation with hemoglobin  recommended. Right lower lobe and posterior upper lobe  subsegmental atelectasis. Upper Abdomen: Postsurgical changes. Musculoskeletal: Post sternotomy. IMPRESSION: Post LVAD placement. Inflow cannula at the apex of the left ventricle is difficult to assess secondary to significant streak artifact. Outflow catheter ascending aortic level. Cardiomegaly. AICD/sequential pacer in place with leads in the region right atrium and right ventricle. Right dialysis catheter extends through the right atrium with the tip at the level of the inferior vena cava/right atrium junction. Interval development of moderately large left-sided pleural effusion and lingula/left lower lobe consolidation suggestive of atelectasis (infiltrate secondary consideration). The left-sided pleural effusion does not appear significantly dense as may be expected with acute blood from leak secondary to LVAD surgery. Correlation with hemoglobin/vital signs recommended. Right lower lobe and posterior upper lobe subsegmental atelectasis. These results were called by telephone at the time of interpretation on 06/27/2017 at 10:41 am to [facility] patients nurse, who verbally acknowledged these results. Electronically Signed   By: Genia Del M.D.   On: 06/27/2017 10:43   Dg Chest Port 1 View  Result Date: 06/26/2017 CLINICAL DATA:  End-stage renal disease. EXAM: PORTABLE CHEST 1 VIEW COMPARISON:  Radiograph of June 24, 2017. FINDINGS: Stable cardiomegaly. Left-sided pacemaker and left ventricular assistance device are unchanged in position. No pneumothorax is noted. Mild left basilar atelectasis and effusion is again noted. Right lung is clear. Interval placement of right internal jugular dialysis catheter with distal tip in expected position of right atrium. Bony thorax is unremarkable. IMPRESSION: Mild left basilar atelectasis or effusion is noted. Stable position of left-sided pacemaker and left ventricular assistance device. Stable left basilar atelectasis and effusion is noted. Interval placement of  right internal jugular dialysis catheter with distal tip in expected position of right atrium. Electronically Signed   By: Marijo Conception, M.D.   On: 06/26/2017 16:09   Dg Fluoro Guide Cv Line-no Report  Result Date: 06/26/2017 Fluoroscopy was utilized by the requesting physician.  No radiographic interpretation.    I have reviewed the patient's current medications.  Assessment/Plan: 1 ESRD vol xs yet, slowly lower.  CLIP in progress.   2 Anemia esa, needs Fe 3 HPTH pending 4 DM controlled 5 CAD per CVS, Cards 6 CM per  Cards 7 Hx DVT P HD TTS, lower vol, cont esa, check PTH, CLIP    LOS: 21 days   Jacquelin Krajewski L 06/28/2017,7:16 AM

## 2017-06-28 NOTE — Telephone Encounter (Signed)
Sildenafil 20 mg TID PA approved through 06/28/19.   Ruta Hinds. Velva Harman, PharmD, BCPS, CPP Clinical Pharmacist Pager: [phone] Phone: [phone] 06/28/2017 10:33 AM

## 2017-06-28 NOTE — Procedures (Signed)
PROCEDURE SUMMARY:  Successful US guided left diagnostic and therapeutic thoracentesis. Yielded 1.0 liters of blood-tinged fluid. Pt tolerated procedure well. No immediate complications.  Specimen was sent for labs. CXR ordered.  Docia Barrier PA-C 06/28/2017 2:58 PM

## 2017-06-28 NOTE — Progress Notes (Signed)
2 Days Post-Op Procedure(s) (LRB): INSERTION OF TUNNELED  DIALYSIS CATHETER RIGHT INTERNAL JUGULAR (Right) ARTERIOVENOUS (AV) FISTULA CREATION VERSUS GRAFT RIGHT  ARM (Right) Subjective: Patient feels well overnight Tolerated hemodialysis yesterday Right arm incisions for dialysis access are healing VAD parameters satisfactory, speed remains 5400 RPM Ramp echocardiogram planned later today  Left thoracentesis, ultrasound-guided, planned after heparin off 6 hours  Temp:  [97.6 F (36.4 C)-98.8 F (37.1 C)] 98.1 F (36.7 C) (10/24 0700) Pulse Rate:  [86-114] 86 (10/24 0800) Cardiac Rhythm: Normal sinus rhythm (10/24 0756) Resp:  [7-28] 7 (10/24 0800) BP: (89-136)/(63-98) 108/89 (10/24 0800) SpO2:  [88 %-99 %] 96 % (10/24 0800) Weight:  [199 lb 8.3 oz (90.5 kg)-204 lb 5.9 oz (92.7 kg)] 204 lb 5.9 oz (92.7 kg) (10/24 0500)  Hemodynamic parameters for last 24 hours:   stable   Intake/Output from previous day: 10/23 0701 - 10/24 0700 In: 814 [P.O.:560; I.V.:254] Out: 3000  Intake/Output this shift: Total I/O In: 15.6 [I.V.:15.6] Out: -        Exam    Surgical incisions remain clean and dry   General- alert and comfortable   Lungs- clear without rales, wheezes   Cor- regular rate and rhythm, no murmur , gallop   Abdomen- soft, non-tender   Extremities - warm, non-tender, minimal edema   Neuro- oriented, appropriate, no focal weakness   Lab Results:  Recent Labs  06/27/17 0418 06/28/17 0505  WBC 16.3* 14.4*  HGB 9.6* 9.1*  HCT 29.0* 28.2*  PLT 449* 441*   BMET:  Recent Labs  06/27/17 0418 06/28/17 0505  NA 130* 131*  K 4.5 3.6  CL 96* 94*  CO2 20* 24  GLUCOSE 156* 158*  BUN 49* 28*  CREATININE 9.04* 6.13*  CALCIUM 8.6* 8.6*    PT/INR:  Recent Labs  06/28/17 0505  LABPROT 17.8*  INR 1.48   ABG    Component Value Date/Time   PHART 7.361 06/15/2017 0334   HCO3 23.3 06/15/2017 0334   TCO2 28 06/18/2017 1626   ACIDBASEDEF 2.0 06/15/2017 0334   O2SAT 59.6 06/24/2017 0330   CBG (last 3)   Recent Labs  06/27/17 2357 06/28/17 0352 06/28/17 0808  GLUCAP 156* 204* 123*    Assessment/Plan: S/P Procedure(s) (LRB): INSERTION OF TUNNELED  DIALYSIS CATHETER RIGHT INTERNAL JUGULAR (Right) ARTERIOVENOUS (AV) FISTULA CREATION VERSUS GRAFT RIGHT  ARM (Right) Continue with Coumadin loading and heparin bridge  left thoracentesis for postop effusion  Ramp echocardiogram  Probable discharge to home Monday, October 29   LOS: 21 days    Tharon Aquas Trigt III 06/28/2017

## 2017-06-28 NOTE — Care Management Note (Addendum)
Case Management Note  Patient Details  Name: [name] MRN: [id number] Date of Birth: 13-Dec-1965  Subjective/Objective:     From home, post op LVAD, conts on vent.   10/10 [place], BSN - POD2 LVAD implant, extubated, conts on lasix drip, inotropes, norepi, dopamine and epi.  PT eval rec HHPT , NCM will cont to follow progression.      10/19 [place], BSN - milrinone stopped today, aim for dc Monday or Tuesday, will need [place] set up next week.   10/22 [name] RN, BSN - conts to be oliguric with rising CVP, conts on heparin drip, s/p tunneled catheter and av fistula today for HD.  Plan is home with [facility] when stable.    10/24 11:10 [name] RN, BSN - patient chose [facility] for HHPT/HHOT, and rolling walker, referral made to [facility] with [facility] , soc will begin 24-48hrs post dc.  Patient's wife does dressing changes for him,  He will have HD on M-W-F, Clip in process.   S/P L thoracentesis for post op pl effusion.  Conts on Heparin/coumadin bridge.  He will be following up with [facility] also. NCM contacted [place] with  [facility] , she will make sure orders get put in.                             Action/Plan: NCM will follow LVAD patient  for dc needs.  Expected Discharge Date:                  Expected Discharge Plan:  [place]  In-House Referral:     Discharge planning Services  CM Consult  Post Acute Care Choice:  Durable Medical Equipment, Home Health Choice offered to:  Patient  DME Arranged:    DME Agency:  [place] Arranged:  PT, OT [facility] Agency:  [place]  Status of Service:  Completed, signed off  If discussed at [place] of Stay Meetings, dates discussed:    Additional Comments:  Zenon Mayo, RN 06/28/2017, 11:10 AM

## 2017-06-28 NOTE — Progress Notes (Addendum)
Patient ID: [name], male   DOB: Jun 16, 1966, 51 y.o.   MRN: [id number]   Advanced Heart Failure VAD Team Note  Subjective:    Admitted 10/3 for RHC/Swan implantation and optimization prior to LVAD.  HeartMate 3 VAD implanted 10/8.  CVVH begun 10/11 CVVH stopped 10/15 Started iHD 10/17 (now TTS) PermCath and R AV fistula placed 06/26/17  HD yesterday, tolerated without problems. Overnight he received 2 doses of IV hydralazine. Walked around the unit this morning. MAP 70s-90s.   INR 1.48 today.   Denies SOB.   LVAD INTERROGATION:  HeartMate 3:  Flow 4.1 liters/min, speed 5400 rpm, power 4, PI 4.7  No PI events.   Objective:    Vital Signs:   Temp:  [97.6 F (36.4 C)-98.8 F (37.1 C)] 98.1 F (36.7 C) (10/24 0300) Pulse Rate:  [87-98] 88 (10/24 0600) Resp:  [12-29] 24 (10/24 0600) BP: (89-136)/(63-98) 114/82 (10/24 0600) SpO2:  [88 %-99 %] 96 % (10/24 0600) Weight:  [199 lb 8.3 oz (90.5 kg)-206 lb 2.1 oz (93.5 kg)] 204 lb 5.9 oz (92.7 kg) (10/24 0500) Last BM Date: 06/27/17 Mean arterial Pressure 70s-90s   Intake/Output:   Intake/Output Summary (Last 24 hours) at 06/28/17 0733 Last data filed at 06/28/17 5573  Gross per 24 hour  Intake              814 ml  Output             3000 ml  Net            -2186 ml     Physical Exam: GENERAL: Well appearing. Sitting in the chair.  HEENT: normal  NECK: Supple, JVP 7-8.  2+ bilaterally, no bruits.  No lymphadenopathy or thyromegaly appreciated.   CARDIAC:  Mechanical heart sounds with LVAD hum present.  LUNGS:  Clear to auscultation bilaterally.  ABDOMEN:  Soft, round, nontender, positive bowel sounds x4.     LVAD exit site: well-healed and incorporated. No erythema or drainage.  Stabilization device present and accurately applied.  Driveline dressing is being changed daily per sterile technique. EXTREMITIES:  Warm and dry, no cyanosis, clubbing, rash or edema  NEUROLOGIC:  Alert and oriented x 4.  Gait steady.  No  aphasia.  No dysarthria.  Affect pleasant.       Telemetry   NSR 80-90s, Personally reviewed.   Labs   Basic Metabolic Panel:  Recent Labs Lab 06/22/17 0219 06/22/17 1649 06/23/17 0300 06/24/17 0316 06/25/17 0349 06/26/17 0305 06/27/17 0418 06/28/17 0505  NA 127* 134* 131* 128* 133* 129*  130* 130* 131*  K 4.0 4.0 4.0 4.1 4.2 3.7  3.7 4.5 3.6  CL 92* 97* 96* 92* 97* 96*  97* 96* 94*  CO2 24 28 27 24 22 24  24  20* 24  GLUCOSE 174* 102* 87 185* 187* 149*  150* 156* 158*  BUN 29* 9 16 33* 27* 38*  39* 49* 28*  CREATININE 6.17* 3.16* 4.51* 6.63* 5.56* 7.52*  7.50* 9.04* 6.13*  CALCIUM 8.0* 7.9* 7.9* 8.2* 8.3* 8.4*  8.4* 8.6* 8.6*  MG 2.0  --  1.9 2.1 1.8 2.1  --   --   PHOS 3.4 1.6*  --   --  3.5 3.6 4.7* 4.2   Liver Function Tests:  Recent Labs Lab 06/22/17 0219  06/23/17 0300 06/24/17 0316 06/25/17 0349 06/26/17 0305 06/27/17 0418 06/28/17 0505  AST 24  --  27 24  --   --   --   --  ALT 15*  --  16* 16*  --   --   --   --   ALKPHOS 115  --  124 136*  --   --   --   --   BILITOT 0.7  --  0.6 0.8  --   --   --   --   PROT 6.3*  --  6.3* 7.0  --   --   --   --   ALBUMIN 2.1*  < > 2.2* 2.3* 2.3* 2.3* 2.4* 2.3*  < > = values in this interval not displayed. No results for input(s): LIPASE, AMYLASE in the last 168 hours. No results for input(s): AMMONIA in the last 168 hours.  CBC:  Recent Labs Lab 06/23/17 0300 06/24/17 0316 06/26/17 0305 06/27/17 0418 06/28/17 0505  WBC 16.1* 16.8* 17.8* 16.3* 14.4*  HGB 8.9* 9.3* 9.7* 9.6* 9.1*  HCT 26.7* 28.0* 29.2* 29.0* 28.2*  MCV 80.4 80.7 80.4 80.3 80.8  PLT 406* 446* 475* 449* 441*   INR:  Recent Labs Lab 06/24/17 0316 06/25/17 0349 06/26/17 0305 06/27/17 0418 06/28/17 0505  INR 1.69 1.46 1.39 1.34 1.48   Other results:  Imaging   Dg Chest 2 View  Result Date: 06/27/2017 CLINICAL DATA:  Left ventricular assist device. Shortness of breath . EXAM: CHEST  2 VIEW COMPARISON:  06/26/2017 .  FINDINGS: Dialysis catheter noted in unchanged position with tip in the right atrium. Left ventricular assist device in stable position. Cardiac pacer stable position. Prior median sternotomy. Cardiomegaly. Mild bilateral interstitial prominence noted suggesting mild component CHF. Persistent atelectasis/infiltrate left lung base. Improvement of left-sided pleural effusion. IMPRESSION: 1. Dialysis catheter noted in unchanged position with tip in the right atrium. Left ventricular assist device in stable position. Cardiac pacer in stable position. Prior median sternotomy. 2. Cardiomegaly with mild bilateral interstitial prominence. A mild component of CHF cannot be excluded. Interim improvement of left-sided pleural effusion. 3. Persistent left lower lobe atelectasis/consolidation . Electronically Signed   By: Marcello Moores  Register   On: 06/27/2017 06:40   Ct Chest Without Contrast  Result Date: 06/27/2017 CLINICAL DATA:  51 year old male with LVAD placement. Evaluate position. Subsequent encounter. EXAM: CT CHEST WITHOUT CONTRAST TECHNIQUE: Multidetector CT imaging of the chest was performed following the standard protocol without IV contrast. COMPARISON:  06/27/2017 chest x-ray.  04/28/2017 chest CT. FINDINGS: Cardiovascular: Evaluation limited by lack of contrast. Additionally significant streak artifact from left ventricular assist device. Inflow cannula at the apex of the left ventricle is difficult to assess secondary to significant streak artifact. Outflow catheter ascending aortic level. Cardiomegaly. AICD/sequential pacer in place with leads in the region right atrium and right ventricle. Right dialysis catheter extends through the right atrium with the tip at the level of the inferior vena cava/right atrium junction. Prominent 3 vessel coronary artery calcification. Partial calcification ascending thoracic aorta. Mediastinum/Nodes: No adenopathy. Lungs/Pleura: Pre-surgical identified pulmonary nodules not  well delineated on the present post surgical exam. Follow-up as previously noted. Interval development of moderately large left-sided pleural effusion and lingula/left lower lobe consolidation which may represent passive atelectasis with air bronchograms. The left-sided pleural effusion does not appear significantly dense as may be expected with acute blood from leak secondary to LVAD surgery. Correlation with hemoglobin recommended. Right lower lobe and posterior upper lobe subsegmental atelectasis. Upper Abdomen: Postsurgical changes. Musculoskeletal: Post sternotomy. IMPRESSION: Post LVAD placement. Inflow cannula at the apex of the left ventricle is difficult to assess secondary to significant streak artifact. Outflow catheter ascending  aortic level. Cardiomegaly. AICD/sequential pacer in place with leads in the region right atrium and right ventricle. Right dialysis catheter extends through the right atrium with the tip at the level of the inferior vena cava/right atrium junction. Interval development of moderately large left-sided pleural effusion and lingula/left lower lobe consolidation suggestive of atelectasis (infiltrate secondary consideration). The left-sided pleural effusion does not appear significantly dense as may be expected with acute blood from leak secondary to LVAD surgery. Correlation with hemoglobin/vital signs recommended. Right lower lobe and posterior upper lobe subsegmental atelectasis. These results were called by telephone at the time of interpretation on 06/27/2017 at 10:41 am to [facility] patients nurse, who verbally acknowledged these results. Electronically Signed   By: Genia Del M.D.   On: 06/27/2017 10:43   Dg Chest Port 1 View  Result Date: 06/26/2017 CLINICAL DATA:  End-stage renal disease. EXAM: PORTABLE CHEST 1 VIEW COMPARISON:  Radiograph of June 24, 2017. FINDINGS: Stable cardiomegaly. Left-sided pacemaker and left ventricular assistance device are unchanged in  position. No pneumothorax is noted. Mild left basilar atelectasis and effusion is again noted. Right lung is clear. Interval placement of right internal jugular dialysis catheter with distal tip in expected position of right atrium. Bony thorax is unremarkable. IMPRESSION: Mild left basilar atelectasis or effusion is noted. Stable position of left-sided pacemaker and left ventricular assistance device. Stable left basilar atelectasis and effusion is noted. Interval placement of right internal jugular dialysis catheter with distal tip in expected position of right atrium. Electronically Signed   By: Marijo Conception, M.D.   On: 06/26/2017 16:09   Dg Fluoro Guide Cv Line-no Report  Result Date: 06/26/2017 Fluoroscopy was utilized by the requesting physician.  No radiographic interpretation.    Medications:     Scheduled Medications: . amiodarone  200 mg Oral BID  . aspirin EC  325 mg Oral Daily   Or  . aspirin  324 mg Per Tube Daily   Or  . aspirin  300 mg Rectal Daily  . atorvastatin  80 mg Oral q1800  . bisacodyl  10 mg Oral Daily   Or  . bisacodyl  10 mg Rectal Daily  . Chlorhexidine Gluconate Cloth  6 each Topical Daily  . [START ON 07/01/2017] darbepoetin (ARANESP) injection - DIALYSIS  150 mcg Intravenous Q Sat-HD  . docusate sodium  200 mg Oral Daily  . feeding supplement (GLUCERNA SHAKE)  237 mL Oral TID BM  . feeding supplement (PRO-STAT SUGAR FREE 64)  30 mL Oral TID WC  . hydrALAZINE  25 mg Oral 3 times per day on Sun Mon Wed Fri  . insulin aspart  0-24 Units Subcutaneous Q4H  . multivitamin  1 tablet Oral QHS  . mupirocin ointment   Nasal BID  . pantoprazole  40 mg Oral Daily  . sildenafil  20 mg Oral TID  . sodium chloride flush  10-40 mL Intracatheter Q12H  . sodium chloride flush  3 mL Intravenous Q12H  . Warfarin - Pharmacist Dosing Inpatient   Does not apply q1800    Infusions: . [START ON 06/29/2017] ferric gluconate (FERRLECIT/NULECIT) IV    . heparin 1,200  Units/hr (06/28/17 3220)    PRN Medications: heparin, heparin, hydrALAZINE, LORazepam, ondansetron (ZOFRAN) IV, oxyCODONE, promethazine, sodium chloride flush, sodium chloride flush, traMADol  Patient Profile   51 yo with history of CAD s/p anterior MI in 2010 and chronic systolic CHF (ischemic cardiomyopathy) as well as CKD stage III now s/p HeartMate 3 VAD placement.  Assessment/Plan:    1. Acute on chronic systolic CHF: Ischemic cardiomyopathy. St Jude ICD. NSTEMI in March 2018 with DES to LAD and RCA, complicated by cardiogenic shock, low output requiring milrinone. Most recent echo 8/18 with EF 20-25%, moderate MR. He was admitted and started on milrinone with improved creatinine.  HeartMate 3 VAD placement on 10/8. Speed cut back to 5400 with onset of afib/RVR 10/11.  Milrinone stopped 10/19. No central line to check co-ox. Will continue sildenafil for RV support.   - INR 1.48, remains on heparin gtt until INR therapeutic.  INR Goal 2-3 (aim slightly higher with h/o Factor V Leiden heterozygosity) and ASA 325. Will not lower aspirin dose with Factor V Leiden.  - MAP 70s-100s. Will need hydralazine on off dialysis days, ordered for 25 mg every 8hrs.  May need the evening after HD as well.  -Plan Ramp ECHO today.  - VAD parameters reviewed personally and stable at this time.  2. AKI on CKD: No renal recovery, tolerating IHD.   - Permcath and AV fistula placed 06/26/17.  - Plan to d/c on IHD. Hopefully this will be set up next week.  Outpatient HD ideally at [facility] where staff has been educated for LVAD.  3. CAD: NSTEMI in 3/18. LHC with 99% ulcerated lesion proximal RCA with left to right collaterals, 95% mid LAD stenosis after mid LAD stent. s/p PCI to RCA and LAD on 11/25/16.  - No s/s of ischemia.    - Off plavix for LVAD.   - Continue statin.   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 Recent V/Q scan did not show acute or chronic PE.  He is a heterozygote for Factor V  Leiden, plan for ASA 325 and warfarin INR 2-3 with LVAD. No change.  5. Chronic N/V/D with gastroparesis: Resolved.  6. DM: On Jardiance at home, will cover with sliding scale. Glucose stable.  7. S/P Multiple Tooth Extractions 4-15, 19-23, and 27-29.  - Stable. 8. Atrial fibrillation: Maintaining NSR.   - Continue amio 200 mg BID.  9. ID: CXR with LLL atelectasis versus infiltrate. Had course of abx for HCAP.  Afebrile.   - He is off vancomycin, zosyn stopped 10/20 10. Hyponatremia: Sodium 131 .  -Free water restrict.  11. Anemia:  - Hgb 9.1.  12. Left Pleural Effusion: Noted on CT chest yesterday.  Has said that he has a hard time taking a deep breath.  Plan for thoracentesis by IR => ordered, will need to hold heparin gtt x 6 hrs prior.   I reviewed the LVAD parameters from today, and compared the results to the patient's prior recorded data.  No programming changes were made.  The LVAD is functioning within specified parameters.  The patient performs LVAD self-test daily.  LVAD interrogation was negative for any significant power changes, alarms or PI events/speed drops.  LVAD equipment check completed and is in good working order.  Back-up equipment present.   LVAD education done on emergency procedures and precautions and reviewed exit site care.   Disposition: Possible d/c early next week. Think he can go to step-down.   Darrick Grinder, NP  06/28/2017 7:33 AM   Patient seen with NP, agree with the above note.    He has tolerated HD so far.  Still with some volume overload, slowly removing with HD.  Breathing stable, has some trouble "taking a deep breath" which could be related to left pleural effusion (moderate on yesterday's CT).   -  Will plan IR-guided left thoracentesis.  Will need to hold heparin gtt for 6 hrs prior.   Ramp echo today with speed adjustment as needed.    INR 1.48, goal 2-3.  Continue heparin gtt, warfarin, ASA 325.   Maintaining NSR on amiodarone.   Continue  to walk in halls.  Probably discharge early next week.  Can go to step-down from my standpoint.   Fitzpatrick Alberico 06/28/2017 7:57 AM

## 2017-06-29 ENCOUNTER — Inpatient Hospital Stay (HOSPITAL_COMMUNITY): Payer: BLUE CROSS/BLUE SHIELD

## 2017-06-29 LAB — CBC
HCT: 27.7 % — ABNORMAL LOW (ref 39.0–52.0)
HEMATOCRIT: 28.3 % — AB (ref 39.0–52.0)
HEMOGLOBIN: 9.2 g/dL — AB (ref 13.0–17.0)
Hemoglobin: 8.9 g/dL — ABNORMAL LOW (ref 13.0–17.0)
MCH: 26.3 pg (ref 26.0–34.0)
MCH: 25.8 pg — ABNORMAL LOW (ref 26.0–34.0)
MCHC: 32.5 g/dL (ref 30.0–36.0)
MCHC: 32.1 g/dL (ref 30.0–36.0)
MCV: 80.9 fL (ref 78.0–100.0)
MCV: 80.3 fL (ref 78.0–100.0)
PLATELETS: 446 10*3/uL — AB (ref 150–400)
Platelets: 450 10*3/uL — ABNORMAL HIGH (ref 150–400)
RBC: 3.5 MIL/uL — AB (ref 4.22–5.81)
RBC: 3.45 MIL/uL — ABNORMAL LOW (ref 4.22–5.81)
RDW: 18.1 % — ABNORMAL HIGH (ref 11.5–15.5)
RDW: 18.2 % — AB (ref 11.5–15.5)
WBC: 13.4 10*3/uL — ABNORMAL HIGH (ref 4.0–10.5)
WBC: 14.5 10*3/uL — AB (ref 4.0–10.5)

## 2017-06-29 LAB — BASIC METABOLIC PANEL
ANION GAP: 15 (ref 5–15)
BUN: 47 mg/dL — ABNORMAL HIGH (ref 6–20)
CALCIUM: 8.3 mg/dL — AB (ref 8.9–10.3)
CO2: 21 mmol/L — ABNORMAL LOW (ref 22–32)
Chloride: 95 mmol/L — ABNORMAL LOW (ref 101–111)
Creatinine, Ser: 8.07 mg/dL — ABNORMAL HIGH (ref 0.61–1.24)
GFR, EST AFRICAN AMERICAN: 8 mL/min — AB (ref >60)
GFR, EST NON AFRICAN AMERICAN: 7 mL/min — AB (ref >60)
Glucose, Bld: 121 mg/dL — ABNORMAL HIGH (ref 65–99)
Potassium: 3.8 mmol/L (ref 3.5–5.1)
Sodium: 131 mmol/L — ABNORMAL LOW (ref 135–145)

## 2017-06-29 LAB — RENAL FUNCTION PANEL
ALBUMIN: 2.3 g/dL — AB (ref 3.5–5.0)
Anion gap: 13 (ref 5–15)
BUN: 48 mg/dL — AB (ref 6–20)
CHLORIDE: 96 mmol/L — AB (ref 101–111)
CO2: 23 mmol/L (ref 22–32)
CREATININE: 8.29 mg/dL — AB (ref 0.61–1.24)
Calcium: 8.4 mg/dL — ABNORMAL LOW (ref 8.9–10.3)
GFR calc Af Amer: 8 mL/min — ABNORMAL LOW (ref >60)
GFR, EST NON AFRICAN AMERICAN: 7 mL/min — AB (ref >60)
GLUCOSE: 117 mg/dL — AB (ref 65–99)
Phosphorus: 5 mg/dL — ABNORMAL HIGH (ref 2.5–4.6)
Potassium: 3.7 mmol/L (ref 3.5–5.1)
Sodium: 132 mmol/L — ABNORMAL LOW (ref 135–145)

## 2017-06-29 LAB — HEPARIN LEVEL (UNFRACTIONATED)
HEPARIN UNFRACTIONATED: 0.47 [IU]/mL (ref 0.30–0.70)
Heparin Unfractionated: 0.4 IU/mL (ref 0.30–0.70)

## 2017-06-29 LAB — LACTATE DEHYDROGENASE: LDH: 266 U/L — ABNORMAL HIGH (ref 98–192)

## 2017-06-29 LAB — GLUCOSE, CAPILLARY
GLUCOSE-CAPILLARY: 161 mg/dL — AB (ref 65–99)
GLUCOSE-CAPILLARY: 127 mg/dL — AB (ref 65–99)
GLUCOSE-CAPILLARY: 105 mg/dL — AB (ref 65–99)
Glucose-Capillary: 186 mg/dL — ABNORMAL HIGH (ref 65–99)
Glucose-Capillary: 170 mg/dL — ABNORMAL HIGH (ref 65–99)
Glucose-Capillary: 162 mg/dL — ABNORMAL HIGH (ref 65–99)

## 2017-06-29 LAB — PROTIME-INR
INR: 1.86
PROTHROMBIN TIME: 21.3 s — AB (ref 11.4–15.2)

## 2017-06-29 MED ORDER — HEPARIN SODIUM (PORCINE) 1000 UNIT/ML DIALYSIS
100.0000 [IU]/kg | INTRAMUSCULAR | Status: DC | PRN
  Filled 2017-06-29: qty 10

## 2017-06-29 MED ORDER — LIDOCAINE HCL (PF) 1 % IJ SOLN: 5.0000 mL | INTRAMUSCULAR | Status: DC | PRN

## 2017-06-29 MED ORDER — HYDRALAZINE HCL 25 MG PO TABS
37.5000 mg | ORAL_TABLET | ORAL | Status: DC
  Administered 2017-06-30: 37.5 mg via ORAL
  Filled 2017-06-29: qty 1.5

## 2017-06-29 MED ORDER — ALTEPLASE 2 MG IJ SOLR: 2.0000 mg | Freq: Once | INTRAMUSCULAR | Status: DC | PRN

## 2017-06-29 MED ORDER — SODIUM CHLORIDE 0.9 % IV SOLN: 100.0000 mL | INTRAVENOUS | Status: DC | PRN

## 2017-06-29 MED ORDER — WARFARIN SODIUM 5 MG PO TABS
5.0000 mg | ORAL_TABLET | Freq: Once | ORAL | Status: AC
  Administered 2017-06-29: 5 mg via ORAL
  Filled 2017-06-29: qty 1

## 2017-06-29 MED ORDER — LIDOCAINE-PRILOCAINE 2.5-2.5 % EX CREA
1.0000 "application " | TOPICAL_CREAM | CUTANEOUS | Status: DC | PRN
  Filled 2017-06-29: qty 5

## 2017-06-29 MED ORDER — PENTAFLUOROPROP-TETRAFLUOROETH EX AERO
1.0000 "application " | INHALATION_SPRAY | CUTANEOUS | Status: DC | PRN
  Filled 2017-06-29: qty 30

## 2017-06-29 MED ORDER — HYDRALAZINE HCL 25 MG PO TABS
37.5000 mg | ORAL_TABLET | ORAL | Status: DC
  Administered 2017-06-29: 37.5 mg via ORAL
  Filled 2017-06-29: qty 1.5

## 2017-06-29 MED ORDER — HEPARIN SODIUM (PORCINE) 1000 UNIT/ML DIALYSIS: 1000.0000 [IU] | INTRAMUSCULAR | Status: DC | PRN

## 2017-06-29 MED ORDER — CALCITRIOL 0.5 MCG PO CAPS
ORAL_CAPSULE | ORAL | Status: AC
  Administered 2017-06-29: 0.5 ug via ORAL
  Filled 2017-06-29: qty 1

## 2017-06-29 MED ORDER — CALCITRIOL 0.25 MCG PO CAPS
0.5000 ug | ORAL_CAPSULE | ORAL | Status: DC
  Administered 2017-06-29 – 2017-07-01 (×2): 0.5 ug via ORAL
  Filled 2017-06-29 (×3): qty 2

## 2017-06-29 NOTE — Progress Notes (Signed)
LVAD Coordinator Rounding Note:  Admitted 06/07/2017 for hemodynamic optimization prior to LVAD placement.   HM3LVAD implanted on 10/8/2018by Dr Craige Cotta [facility]  Vital signs: Temp: 98.1 HR: 86 Doppler: 90 Auto cuff: 108/89 (97) O2 Sat: 96% on RA  Wt:194>217>220>220>226>213>207>199>198 >198>199>201>207>204>205>204>197lbs   LVAD interrogation reveals:  Speed: 5400 Flow:  4.1 Power: 4.0w PI: 4 Alarms: none Events: none Hematocrit: 29 Fixed speed: 5400 Low speed limit: 5100   Drive Line: Daily. Change every other day per protocolusing daily kits and silver strip. Due today.   Labs:  LDH trend:255>343>379>376>366>314>304>286>284>257>276>245>266  INR trend: 1.44>1.23>1.44>1.47>1.93>2.41>2.13>1.72>1.89>1.39>1.34>1.48>1.86  Hgb trend: 9.6>8.8>8.1>8.6>8.8>8.6>8.1>8.0>7.7>8.9>9.7>9.6>9.1>8.9  Anticoagulation Plan: -INR Goal: 2-3, warfarin restarted 10/22  -ASA Dose: 363m (will remain on this with factor V heterozygosity) -Heparin level 0.3 - 0.7  Blood Products:  - 2 units FFP in OR - 1 unit PCin OR - 1 unit PRBC's 10/12 - 1 unit PRBC's 10/18   Gtts: - Milrinone 0.25 mcg/mg/min - wean to 0.125 today, off 10/18 - Heparin 1200u/hr  Device: -St Jude  -Therapies: off. Perm pacer set at 60 bpm. -Temp pacer set AAI at 88 bpm, MA-5   Renal:  Creat trend:  1.62>2.75>4.04>3.71>>2.56>2.44>2.52>4.52>4.4>6.17>6.13 -CVVHD 10/12-10/15 -Intermittent HD started 10/16   Plan/Recommendations:  1. Continue every other day dressing changes and involve caregivers as possible. Due today. 2. Caregiver education complete. Pt will need to be educated before discharge. 3[facility] per patient request. This will accommodate his transportation. VAD education has been completed at this center. 4. Call VAD Coordinator if any questions re: VAD or VAD equipment.    STanda RockersRN, VAD Coordinator 24/7 pager [phone]

## 2017-06-29 NOTE — Progress Notes (Signed)
Patient ID: [name], male   DOB: 12/28/1965, 51 y.o.   MRN: [id number]   Advanced Heart Failure VAD Team Note  Subjective:    Admitted 10/3 for RHC/Swan implantation and optimization prior to LVAD.  HeartMate 3 VAD implanted 10/8.  CVVH begun 10/11 CVVH stopped 10/15 Started iHD 10/17 (now TTS) PermCath and R AV fistula placed 06/26/17 Ramp ECHO 10/24 Speed increased to 5600 Left thoracentesis 10/24  Feeling good. Denies SOB.   INR up to 1.86, LDH 266  LVAD INTERROGATION:  HeartMate 3:  Flow 4.1 liters/min, speed 5600 rpm, power 4.1 , PI 4.1  No PI events    Objective:    Vital Signs:   Temp:  [97.8 F (36.6 C)-98.5 F (36.9 C)] 98 F (36.7 C) (10/25 0400) Pulse Rate:  [86-90] 86 (10/25 0400) Resp:  [7-39] 12 (10/25 0400) BP: (70-120)/(56-97) 110/83 (10/25 0400) SpO2:  [95 %-97 %] 95 % (10/25 0400) Weight:  [198 lb (89.8 kg)] 198 lb (89.8 kg) (10/25 0411) Last BM Date: 06/28/17 Mean arterial Pressure 90s   Intake/Output:   Intake/Output Summary (Last 24 hours) at 06/29/17 0715 Last data filed at 06/29/17 0500  Gross per 24 hour  Intake            517.8 ml  Output             1000 ml  Net           -482.2 ml     Physical Exam: GENERAL: NAD.  HEENT: normal  NECK: Supple, JVP 9-10 .  2+ bilaterally, no bruits.  No lymphadenopathy or thyromegaly appreciated.   CARDIAC:  Mechanical heart sounds with LVAD hum present.  LUNGS:  Clear to auscultation bilaterally on room air. ABDOMEN:  Soft, round, nontender, positive bowel sounds x4.     LVAD exit site: well-healed and incorporated.  Dressing dry and intact.  No erythema or drainage.  Stabilization device present and accurately applied.  Driveline dressing is being changed daily per sterile technique. EXTREMITIES:  Warm and dry, no cyanosis, clubbing, rash or edema  NEUROLOGIC:  Alert and oriented x 4.  Gait steady.  No aphasia.  No dysarthria.  Affect pleasant.       Telemetry  NSR 80-90s   Personally  reviewed by Darrick Grinder      Labs   Basic Metabolic Panel:  Recent Labs Lab 06/22/17 1649 06/23/17 0300 06/24/17 0316 06/25/17 0349 06/26/17 0305 06/27/17 0418 06/27/17 1152 06/28/17 0505  NA 134* 131* 128* 133* 129*  130* 130*  --  131*  K 4.0 4.0 4.1 4.2 3.7  3.7 4.5  --  3.6  CL 97* 96* 92* 97* 96*  97* 96*  --  94*  CO2 28 27 24 22 24  24  20*  --  24  GLUCOSE 102* 87 185* 187* 149*  150* 156*  --  158*  BUN 9 16 33* 27* 38*  39* 49*  --  28*  CREATININE 3.16* 4.51* 6.63* 5.56* 7.52*  7.50* 9.04*  --  6.13*  CALCIUM 7.9* 7.9* 8.2* 8.3* 8.4*  8.4* 8.6* 8.7 8.6*  MG  --  1.9 2.1 1.8 2.1  --   --   --   PHOS 1.6*  --   --  3.5 3.6 4.7*  --  4.2   Liver Function Tests:  Recent Labs Lab 06/23/17 0300 06/24/17 0316 06/25/17 0349 06/26/17 0305 06/27/17 0418 06/28/17 0505  AST 27 24  --   --   --   --  ALT 16* 16*  --   --   --   --   ALKPHOS 124 136*  --   --   --   --   BILITOT 0.6 0.8  --   --   --   --   PROT 6.3* 7.0  --   --   --   --   ALBUMIN 2.2* 2.3* 2.3* 2.3* 2.4* 2.3*   No results for input(s): LIPASE, AMYLASE in the last 168 hours. No results for input(s): AMMONIA in the last 168 hours.  CBC:  Recent Labs Lab 06/24/17 0316 06/26/17 0305 06/27/17 0418 06/28/17 0505 06/29/17 0247  WBC 16.8* 17.8* 16.3* 14.4* 13.4*  HGB 9.3* 9.7* 9.6* 9.1* 9.2*  HCT 28.0* 29.2* 29.0* 28.2* 28.3*  MCV 80.7 80.4 80.3 80.8 80.9  PLT 446* 475* 449* 441* 450*   INR:  Recent Labs Lab 06/25/17 0349 06/26/17 0305 06/27/17 0418 06/28/17 0505 06/29/17 0247  INR 1.46 1.39 1.34 1.48 1.86   Other results:  Imaging   Dg Chest 1 View  Result Date: 06/28/2017 CLINICAL DATA:  Status post left-sided thoracentesis. EXAM: CHEST 1 VIEW COMPARISON:  06/27/2017. FINDINGS: Right chest wall dialysis catheter is noted with tips in the right atrium. Left ventricular assist device is noted. There is a left chest wall ICD with lead in the right atrial appendage and  right ventricle. Moderate cardiac enlargement. Left pleural effusion is identified. No pneumothorax status post left thoracentesis. IMPRESSION: 1. No pneumothorax visualized status post left thoracentesis. Electronically Signed   By: Kerby Moors M.D.   On: 06/28/2017 15:18   Ct Chest Without Contrast  Result Date: 06/27/2017 CLINICAL DATA:  51 year old male with LVAD placement. Evaluate position. Subsequent encounter. EXAM: CT CHEST WITHOUT CONTRAST TECHNIQUE: Multidetector CT imaging of the chest was performed following the standard protocol without IV contrast. COMPARISON:  06/27/2017 chest x-ray.  04/28/2017 chest CT. FINDINGS: Cardiovascular: Evaluation limited by lack of contrast. Additionally significant streak artifact from left ventricular assist device. Inflow cannula at the apex of the left ventricle is difficult to assess secondary to significant streak artifact. Outflow catheter ascending aortic level. Cardiomegaly. AICD/sequential pacer in place with leads in the region right atrium and right ventricle. Right dialysis catheter extends through the right atrium with the tip at the level of the inferior vena cava/right atrium junction. Prominent 3 vessel coronary artery calcification. Partial calcification ascending thoracic aorta. Mediastinum/Nodes: No adenopathy. Lungs/Pleura: Pre-surgical identified pulmonary nodules not well delineated on the present post surgical exam. Follow-up as previously noted. Interval development of moderately large left-sided pleural effusion and lingula/left lower lobe consolidation which may represent passive atelectasis with air bronchograms. The left-sided pleural effusion does not appear significantly dense as may be expected with acute blood from leak secondary to LVAD surgery. Correlation with hemoglobin recommended. Right lower lobe and posterior upper lobe subsegmental atelectasis. Upper Abdomen: Postsurgical changes. Musculoskeletal: Post sternotomy.  IMPRESSION: Post LVAD placement. Inflow cannula at the apex of the left ventricle is difficult to assess secondary to significant streak artifact. Outflow catheter ascending aortic level. Cardiomegaly. AICD/sequential pacer in place with leads in the region right atrium and right ventricle. Right dialysis catheter extends through the right atrium with the tip at the level of the inferior vena cava/right atrium junction. Interval development of moderately large left-sided pleural effusion and lingula/left lower lobe consolidation suggestive of atelectasis (infiltrate secondary consideration). The left-sided pleural effusion does not appear significantly dense as may be expected with acute blood from leak secondary to  LVAD surgery. Correlation with hemoglobin/vital signs recommended. Right lower lobe and posterior upper lobe subsegmental atelectasis. These results were called by telephone at the time of interpretation on 06/27/2017 at 10:41 am to [facility] patients nurse, who verbally acknowledged these results. Electronically Signed   By: Genia Del M.D.   On: 06/27/2017 10:43   Ir Thoracentesis Asp Pleural Space W/img Guide  Result Date: 06/28/2017 INDICATION: Patient status post LVAD placement 06/15/2017. Now with left pleural effusion. Request is made for diagnostic and therapeutic thoracentesis. EXAM: ULTRASOUND GUIDED DIAGNOSTIC AND THERAPEUTIC LEFT THORACENTESIS MEDICATIONS: 10 mL 1% lidocaine COMPLICATIONS: None immediate. PROCEDURE: An ultrasound guided thoracentesis was thoroughly discussed with the patient and questions answered. The benefits, risks, alternatives and complications were also discussed. The patient understands and wishes to proceed with the procedure. Written consent was obtained. Ultrasound was performed to localize and mark an adequate pocket of fluid in the left chest. The area was then prepped and draped in the normal sterile fashion. 1% Lidocaine was used for local anesthesia.  Under ultrasound guidance a Safe-T-Centesis catheter was introduced. Thoracentesis was performed. The catheter was removed and a dressing applied. FINDINGS: A total of approximately 1.0 liters of blood-tinged fluid was removed. Samples were sent to the laboratory as requested by the clinical team. IMPRESSION: Successful ultrasound guided diagnostic and therapeutic left thoracentesis yielding 1.0 liters of pleural fluid. Read by:  Brynda Greathouse PA-C Electronically Signed   By: Jerilynn Mages.  Shick M.D.   On: 06/28/2017 15:27    Medications:     Scheduled Medications: . amiodarone  200 mg Oral BID  . aspirin EC  325 mg Oral Daily   Or  . aspirin  324 mg Per Tube Daily   Or  . aspirin  300 mg Rectal Daily  . atorvastatin  80 mg Oral q1800  . bisacodyl  10 mg Oral Daily   Or  . bisacodyl  10 mg Rectal Daily  . calcitRIOL  0.5 mcg Oral Q T,Th,Sa-HD  . Chlorhexidine Gluconate Cloth  6 each Topical Daily  . [START ON 07/01/2017] darbepoetin (ARANESP) injection - DIALYSIS  150 mcg Intravenous Q Sat-HD  . docusate sodium  200 mg Oral Daily  . feeding supplement (GLUCERNA SHAKE)  237 mL Oral TID BM  . feeding supplement (PRO-STAT SUGAR FREE 64)  30 mL Oral TID WC  . hydrALAZINE  25 mg Oral 3 times per day on Sun Mon Wed Fri  . hydrALAZINE  25 mg Oral Once per day on Tue Thu Sat  . insulin aspart  0-24 Units Subcutaneous Q4H  . multivitamin  1 tablet Oral QHS  . mupirocin ointment   Nasal BID  . pantoprazole  40 mg Oral Daily  . sildenafil  20 mg Oral TID  . sodium chloride flush  10-40 mL Intracatheter Q12H  . sodium chloride flush  3 mL Intravenous Q12H  . Warfarin - Pharmacist Dosing Inpatient   Does not apply q1800    Infusions: . ferric gluconate (FERRLECIT/NULECIT) IV    . heparin 1,200 Units/hr (06/29/17 0500)    PRN Medications: heparin, heparin, hydrALAZINE, lidocaine (PF), LORazepam, ondansetron (ZOFRAN) IV, oxyCODONE, promethazine, sodium chloride flush, sodium chloride flush,  traMADol  Patient Profile   51 yo with history of CAD s/p anterior MI in 2010 and chronic systolic CHF (ischemic cardiomyopathy) as well as CKD stage III now s/p HeartMate 3 VAD placement.   Assessment/Plan:    1. Acute on chronic systolic CHF: Ischemic cardiomyopathy. [employer] ICD. NSTEMI in  March 2018 with DES to LAD and RCA, complicated by cardiogenic shock, low output requiring milrinone. Most recent echo 8/18 with EF 20-25%, moderate MR. He was admitted and started on milrinone with improved creatinine.  HeartMate 3 VAD placement on 10/8. Speed cut back to 5400 with onset of afib/RVR 10/11.  Milrinone stopped 10/19. No central line to check co-ox. Will continue sildenafil for RV support.   - INR 1.86, remains on heparin gtt until INR therapeutic.  INR Goal 2-3 (aim slightly higher with h/o Factor V Leiden heterozygosity) and ASA 325. Will not lower aspirin dose with Factor V Leiden.  - MAPs 90s. Will increase hydralazine on pm of HD days and on non-HD days to 37.5 mg tid.  If BP not low at HD today, would consider BP med continuing on HD days.  - VAD parameters reviewed personally and stable at this time.  2. AKI on CKD: No renal recovery, tolerating IHD.   - Permcath and AV fistula placed 06/26/17.  - Plan for outpatient HD M[facility]W-F at [facility]  3. CAD: NSTEMI in 3/18. LHC with 99% ulcerated lesion proximal RCA with left to right collaterals, 95% mid LAD stenosis after mid LAD stent. s/p PCI to RCA and LAD on 11/25/16.  - No S/S ischemia  - Off plavix for LVAD.   - Continue statin.   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 Recent V/Q scan did not show acute or chronic PE.  He is a heterozygote for Factor V Leiden, plan for ASA 325 and warfarin INR 2-3 with LVAD. No change. INR 1.86 5. Chronic N/V/D with gastroparesis: Resolved.  6. DM: On Jardiance at home, will cover with sliding scale. Glucose stable.  7. S/P Multiple Tooth Extractions 4-15, 19-23, and 27-29.  - Stable. 8.  Atrial fibrillation: Maintaining NSR.   - Continue amio 200 mg BID.  9. ID: CXR with LLL atelectasis versus infiltrate. Had course of abx for HCAP.   Afebrile.   - He is off vancomycin, zosyn stopped 10/20 10. Hyponatremia: BMEt pending  -Free water restrict.  11. Anemia:  - Hgb 9.2   12. Left Pleural Effusion: S/P Thoracentesis 10/24 with 1 liter removed.   I reviewed the LVAD parameters from today, and compared the results to the patient's prior recorded data.  No programming changes were made.  The LVAD is functioning within specified parameters.  The patient performs LVAD self-test daily.  LVAD interrogation was negative for any significant power changes, alarms or PI events/speed drops.  LVAD equipment check completed and is in good working order.  Back-up equipment present.   LVAD education done on emergency procedures and precautions and reviewed exit site care.   Disposition: Home likely Monday. HHPT/HHRN ordered. Will need CM to check on cost of revatio. Consult placed.   Darrick Grinder, NP  06/29/2017 7:15 AM   Patient seen with NP, agree with the above note.  Speed increased to 5800 rpm yesterday, patient tolerated well.  No complaints this morning, breathing easier after thoracentesis.    MAP remains elevated in 90s.  As above, increase hydralazine to 37.5 mg tid on non-HD days and on evening of HD days.  If MAP remains elevated with HD today, would consider continuing BP meds on HD days (goal MAP 70-90).   He is maintaining NSR on amiodarone.   INR 1.86.  Given clotting risk from FVL heterozygosity, would continue heparin gtt until INR reaches 2.    Continue to walk in halls.  Plan for home likely on Monday.   Kalif Kattner 06/29/2017 10:28 AM

## 2017-06-29 NOTE — Progress Notes (Signed)
Nutrition Education Note  Pt is a 50 year old male with PMH of CKD stage III, CHF, and CAD s/p anterior MI in 2010. Admitted for optimization prior to LVAD on 10/3. Pt underwent tunneled dialysis catheter and AV fistula on 10/22. The plan is for him to attend MWF HD after discharge. Pt was dialyzed prior to visit today.  Discussed the effects that high-phosphorus and high-potassium foods have on the body along with specific foods and drinks to limit or avoid if possible.Pt has CHF and has already been limiting sodium intake. Encouraged him to continue to do so. Discussed phosphate binders and fluid intake.   Discussed and provided the following handouts from the [facility], 'Dietary Guidelines for Adults Starting on Hemodialysis' and 'Sodium, Phosphorus, Potassium , Fluid + the CKD Diet'.  Reminded pt that a RD would monitor him at his dialysis center and would assist as needed. Pt had no further questions.Expect good compliance.  [place] Dietetic Intern Pager: [phone] 06/29/2017 3:23 PM

## 2017-06-29 NOTE — Progress Notes (Signed)
ANTICOAGULATION CONSULT NOTE - Follow Up Consult  Pharmacy Consult for heparin Indication: LVAD, h/o DVT, Factor V Leiden heterozygosity  Labs:  Recent Labs  06/27/17 0418 06/27/17 1823 06/28/17 0505 06/29/17 0247  HGB 9.6*  --  9.1* 9.2*  HCT 29.0*  --  28.2* 28.3*  PLT 449*  --  441* 450*  LABPROT 16.5*  --  17.8* 21.3*  INR 1.34  --  1.48 1.86  HEPARINUNFRC 0.28* 0.45 0.25* 0.40  CREATININE 9.04*  --  6.13*  --     Assessment/Plan:  51yo male therapeutic on heparin after resumed. Will continue gtt at current rate and confirm stable with additional level.   Wynona Neat, PharmD, BCPS  06/29/2017,4:31 AM

## 2017-06-29 NOTE — Progress Notes (Signed)
OT Cancellation Note  Patient Details Name: [name] MRN: [id number] DOB: 10-11-1965   Cancelled Treatment:    Reason Eval/Treat Not Completed: Patient at procedure or test/ unavailable. Pt in hemodialysis.  Almon Register [id number] 06/29/2017, 11:18 AM

## 2017-06-29 NOTE — Procedures (Signed)
I was present at this session.  I have reviewed the session itself and made appropriate changes.  Clotted system. bp ok, tol HD.    Taylar Hartsough L 10/25/201812:00 PM

## 2017-06-29 NOTE — Progress Notes (Signed)
Subjective: Interval History: has no complaint, had thoracentesis yest.  Objective: Vital signs in last 24 hours: Temp:  [97.8 F (36.6 C)-98.5 F (36.9 C)] 98 F (36.7 C) (10/25 0400) Pulse Rate:  [86-90] 86 (10/25 0400) Resp:  [7-39] 12 (10/25 0400) BP: (70-120)/(56-97) 110/83 (10/25 0400) SpO2:  [95 %-97 %] 95 % (10/25 0400) Weight:  [89.8 kg (198 lb)] 89.8 kg (198 lb) (10/25 0411) Weight change: -3.688 kg (-8 lb 2.1 oz)  Intake/Output from previous day: 10/24 0701 - 10/25 0700 In: 517.8 [P.O.:360; I.V.:157.8] Out: 1000  Intake/Output this shift: Total I/O In: 382.2 [P.O.:240; I.V.:142.2] Out: -   General appearance: alert, cooperative, no distress and mildly obese Resp: rales LLL Chest wall: see below Cardio: RIJ cath L [place] pacer GI: LVD tubes LUQ , pos bs, liver down  4 cm Extremities: edema 1+ and avf RUA  CV hum of LVAD  Lab Results:  Recent Labs  06/28/17 0505 06/29/17 0247  WBC 14.4* 13.4*  HGB 9.1* 9.2*  HCT 28.2* 28.3*  PLT 441* 450*   BMET:  Recent Labs  06/27/17 0418 06/27/17 1152 06/28/17 0505  NA 130*  --  131*  K 4.5  --  3.6  CL 96*  --  94*  CO2 20*  --  24  GLUCOSE 156*  --  158*  BUN 49*  --  28*  CREATININE 9.04*  --  6.13*  CALCIUM 8.6* 8.7 8.6*    Recent Labs  06/27/17 1152  PTH 168*  Comment   Iron Studies:  Recent Labs  06/27/17 1152  IRON 15*  TIBC 251    Studies/Results: Dg Chest 1 View  Result Date: 06/28/2017 CLINICAL DATA:  Status post left-sided thoracentesis. EXAM: CHEST 1 VIEW COMPARISON:  06/27/2017. FINDINGS: Right chest wall dialysis catheter is noted with tips in the right atrium. Left ventricular assist device is noted. There is a left chest wall ICD with lead in the right atrial appendage and right ventricle. Moderate cardiac enlargement. Left pleural effusion is identified. No pneumothorax status post left thoracentesis. IMPRESSION: 1. No pneumothorax visualized status post left thoracentesis.  Electronically Signed   By: Kerby Moors M.D.   On: 06/28/2017 15:18   Ct Chest Without Contrast  Result Date: 06/27/2017 CLINICAL DATA:  51 year old male with LVAD placement. Evaluate position. Subsequent encounter. EXAM: CT CHEST WITHOUT CONTRAST TECHNIQUE: Multidetector CT imaging of the chest was performed following the standard protocol without IV contrast. COMPARISON:  06/27/2017 chest x-ray.  04/28/2017 chest CT. FINDINGS: Cardiovascular: Evaluation limited by lack of contrast. Additionally significant streak artifact from left ventricular assist device. Inflow cannula at the apex of the left ventricle is difficult to assess secondary to significant streak artifact. Outflow catheter ascending aortic level. Cardiomegaly. AICD/sequential pacer in place with leads in the region right atrium and right ventricle. Right dialysis catheter extends through the right atrium with the tip at the level of the inferior vena cava/right atrium junction. Prominent 3 vessel coronary artery calcification. Partial calcification ascending thoracic aorta. Mediastinum/Nodes: No adenopathy. Lungs/Pleura: Pre-surgical identified pulmonary nodules not well delineated on the present post surgical exam. Follow-up as previously noted. Interval development of moderately large left-sided pleural effusion and lingula/left lower lobe consolidation which may represent passive atelectasis with air bronchograms. The left-sided pleural effusion does not appear significantly dense as may be expected with acute blood from leak secondary to LVAD surgery. Correlation with hemoglobin recommended. Right lower lobe and posterior upper lobe subsegmental atelectasis. Upper Abdomen: Postsurgical changes. Musculoskeletal: Post  sternotomy. IMPRESSION: Post LVAD placement. Inflow cannula at the apex of the left ventricle is difficult to assess secondary to significant streak artifact. Outflow catheter ascending aortic level. Cardiomegaly.  AICD/sequential pacer in place with leads in the region right atrium and right ventricle. Right dialysis catheter extends through the right atrium with the tip at the level of the inferior vena cava/right atrium junction. Interval development of moderately large left-sided pleural effusion and lingula/left lower lobe consolidation suggestive of atelectasis (infiltrate secondary consideration). The left-sided pleural effusion does not appear significantly dense as may be expected with acute blood from leak secondary to LVAD surgery. Correlation with hemoglobin/vital signs recommended. Right lower lobe and posterior upper lobe subsegmental atelectasis. These results were called by telephone at the time of interpretation on 06/27/2017 at 10:41 am to [facility] patients nurse, who verbally acknowledged these results. Electronically Signed   By: Genia Del M.D.   On: 06/27/2017 10:43   Ir Thoracentesis Asp Pleural Space W/img Guide  Result Date: 06/28/2017 INDICATION: Patient status post LVAD placement 06/15/2017. Now with left pleural effusion. Request is made for diagnostic and therapeutic thoracentesis. EXAM: ULTRASOUND GUIDED DIAGNOSTIC AND THERAPEUTIC LEFT THORACENTESIS MEDICATIONS: 10 mL 1% lidocaine COMPLICATIONS: None immediate. PROCEDURE: An ultrasound guided thoracentesis was thoroughly discussed with the patient and questions answered. The benefits, risks, alternatives and complications were also discussed. The patient understands and wishes to proceed with the procedure. Written consent was obtained. Ultrasound was performed to localize and mark an adequate pocket of fluid in the left chest. The area was then prepped and draped in the normal sterile fashion. 1% Lidocaine was used for local anesthesia. Under ultrasound guidance a Safe-T-Centesis catheter was introduced. Thoracentesis was performed. The catheter was removed and a dressing applied. FINDINGS: A total of approximately 1.0 liters of  blood-tinged fluid was removed. Samples were sent to the laboratory as requested by the clinical team. IMPRESSION: Successful ultrasound guided diagnostic and therapeutic left thoracentesis yielding 1.0 liters of pleural fluid. Read by:  Brynda Greathouse PA-C Electronically Signed   By: Jerilynn Mages.  Shick M.D.   On: 06/28/2017 15:27    I have reviewed the patient's current medications.  Assessment/Plan: 1 ESRD for HD , lower vol and solute 2 Anemia Fe/esa 3 HPTH vit D  4 DM controlled 5 CAD per CVS 6 CM per cards 7 Hx DVT P HD, esa, Fe, vi t D , mobilize , has outpatient spot   LOS: 22 days   Delani Kohli L 06/29/2017,7:00 AM

## 2017-06-29 NOTE — Progress Notes (Signed)
[place] for Coumadin/Heparin Indication: LVAD, h/o DVT, Factor V Leiden heterozygosity  Allergies  Allergen Reactions  . Metformin And Related Diarrhea    Patient Measurements: Height: 5' 8"  (172.7 cm) Weight: 198 lb (89.8 kg) IBW/kg (Calculated) : 68.4   Vital Signs: Temp: 98 F (36.7 C) (10/25 0400) Temp Source: Oral (10/25 0400) BP: 110/83 (10/25 0400) Pulse Rate: 86 (10/25 0400)  Labs:  Recent Labs  06/27/17 0418 06/27/17 1823 06/28/17 0505 06/29/17 0247  HGB 9.6*  --  9.1* 9.2*  HCT 29.0*  --  28.2* 28.3*  PLT 449*  --  441* 450*  LABPROT 16.5*  --  17.8* 21.3*  INR 1.34  --  1.48 1.86  HEPARINUNFRC 0.28* 0.45 0.25* 0.40  CREATININE 9.04*  --  6.13*  --     Estimated Creatinine Clearance: 15.4 mL/min (A) (by C-G formula based on SCr of 6.13 mg/dL (H)).   Medical History: Past Medical History:  Diagnosis Date  . Angina   . ASCVD (arteriosclerotic cardiovascular disease)    , Anterior infarction 2005, LAD diagonal bifurcation intervention 03/2004  . Automatic implantable cardiac defibrillator -St. Jude's       . Benign neoplasm of colon   . CHF (congestive heart failure) ([place])   . Chronic systolic heart failure ([place])   . Coronary artery disease     Widely patent previously placed stents in the left anterior   . Crohn's disease ([place])   . 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 Dyspnea   . Gastroparesis   . GERD (gastroesophageal reflux disease)   . High cholesterol   . Hyperlipidemia   . Hypersomnolent    Previous diagnosis of narcolepsy  . Hypertension, essential   . Ischemic cardiomyopathy    Ejection fraction 15-20% catheterization 2010  . Type II or unspecified type diabetes mellitus without mention of complication, not stated as uncontrolled   . Unspecified gastritis and gastroduodenitis without mention of hemorrhage     Assessment: 22 yoM on chronic Coumadin for Factor 5 Leiden  and hx DVTs admitted for LVAD placement. Coumadin held 10/19 and heparin started for HD vascular access placement 10/22.   Coumadin now resumed 10/22 and INR continues to be subtherapeutic but trending up to 1.8. D/w HF team and will continue heparin until INR >2, heparin at goal this morning.  CBC, LDH stable.   Goal of Therapy:  INR 2-3  Heparin Level 0.3-0.7 Monitor platelets by anticoagulation protocol: Yes   Plan:  -Coumadin 81m PO x1 tonight -Continue heparin another day -Daily INR, CBC, LDH, heparin level  FErin HearingPharmD., BCPS Clinical Pharmacist Pager [phone]/25/2018 2:13 PM

## 2017-06-29 NOTE — Progress Notes (Signed)
Physical Therapy Treatment Patient Details Name: [name] MRN: [id number] DOB: 1966/02/28 Today's Date: 06/29/2017    History of Present Illness 51 yo with history of CAD s/p anterior MI in 2010 and chronic systolic CHF (ischemic cardiomyopathy) as well as CKD stage III now s/p HeartMate 3 VAD placement.  PMH includes:  ASCVD,  CHF, Chron's disease, DVT, gastroparesis; HTN, ischemic cardiomyopathy DM; s/p cardiac debriblator placement.     PT Comments    Patient continues to make gradual progress with mobility. Pt reported feeling fatigued today due to HD this am and had returned recently prior to session. Pt required supervision for bed mobility and min guard/min A for functional transfers from EOB and rollator seat. Pt tolerated ambulating 238f total with seated rest break. Pt is able to manage all LVAD equipment without assist or cues and remembered extra batteries and clips. Continue to progress as tolerated.     Follow Up Recommendations  Home health PT;Supervision/Assistance - 24 hour     Equipment Recommendations  Other (comment) (rollator)    Recommendations for Other Services       Precautions / Restrictions Precautions Precautions: Fall;Sternal Precaution Comments: LVAD Restrictions Weight Bearing Restrictions: Yes (sternal precautions)    Mobility  Bed Mobility Overal bed mobility: Needs Assistance Bed Mobility: Rolling;Sidelying to Sit Rolling: Supervision Sidelying to sit: Supervision       General bed mobility comments: cues for maintaining precautions; supervision for safety  Transfers Overall transfer level: Needs assistance Equipment used:  (rollator) Transfers: Sit to/from Stand Sit to Stand: Min guard;Min assist         General transfer comment: min guard when standing from EOB and min A to sit/stand from rollator; pt with carry of technique and used momentum to power up without UE support  Ambulation/Gait Ambulation/Gait  assistance: Min guard;Supervision Ambulation Distance (Feet):  ([id number]) Assistive device: Rolling walker (2 wheeled) Gait Pattern/deviations: Step-through pattern;Decreased stride length;Wide base of support Gait velocity: decreased   General Gait Details: pt with slow, steady gait; one seated rest break needed due to fatigue; pt demonstrated good safety awareness when sitting on rollator and locked brakes prior to sitting without cues   Stairs            Wheelchair Mobility    Modified Rankin (Stroke Patients Only)       Balance Overall balance assessment: Needs assistance Sitting-balance support: No upper extremity supported;Feet supported Sitting balance-[name]: Good       Standing balance-[name]: Fair Standing balance comment: pt is able to static stand without UE support                            Cognition Arousal/Alertness: Awake/alert Behavior During Therapy: WFL for tasks assessed/performed Overall Cognitive Status: Within Functional Limits for tasks assessed                                        Exercises      General Comments        Pertinent Vitals/Pain Pain Assessment: No/denies pain Pain Intervention(s): Monitored during session    Home Living                      Prior Function            PT Goals (current goals can now be found  in the care plan section) Acute Rehab PT Goals Patient Stated Goal: to go home PT Goal Formulation: With patient Time For Goal Achievement: 06/28/17 Potential to Achieve Goals: Good Progress towards PT goals: Progressing toward goals    Frequency    Min 3X/week      PT Plan Current plan remains appropriate    Co-evaluation              AM-PAC PT "6 Clicks" Daily Activity  Outcome Measure  Difficulty turning over in bed (including adjusting bedclothes, sheets and blankets)?: None Difficulty moving from lying on back to sitting on the side of  the bed? : A Lot Difficulty sitting down on and standing up from a chair with arms (e.g., wheelchair, bedside commode, etc,.)?: Unable Help needed moving to and from a bed to chair (including a wheelchair)?: A Little Help needed walking in hospital room?: A Little Help needed climbing 3-5 steps with a railing? : A Lot 6 Click Score: 15    End of Session Equipment Utilized During Treatment: Gait belt Activity Tolerance: Patient tolerated treatment well Patient left: with call bell/phone within reach;in chair Nurse Communication: Mobility status PT Visit Diagnosis: Unsteadiness on feet (R26.81);Muscle weakness (generalized) (M62.81)     Time: 0233-4356 PT Time Calculation (min) (ACUTE ONLY): 27 min  Charges:  $Gait Training: 8-22 mins $Therapeutic Activity: 8-22 mins                    G Codes:       Earney Navy, PTA Pager: [phone]     Darliss Cheney 06/29/2017, 3:54 PM

## 2017-06-29 NOTE — Progress Notes (Signed)
CSW met with patient at bedside. Patient spoke about discharge home and plan for outpatient dialysis. Patient looking forward to regaining some normalcy and getting his dentures. Patient  Appears to be in good spirits and good supports at home. CSW will continue to follow for supportive needs. Raquel Sarna, [address], [place]

## 2017-06-29 NOTE — Progress Notes (Signed)
PT Cancellation Note  Patient Details Name: [name] MRN: [id number] DOB: 06/02/1966   Cancelled Treatment:    Reason Eval/Treat Not Completed: Other (comment) Patient awaiting HD transport. PT will check on pt later as time allows.    [place] April, PTA Pager: [phone]   06/29/2017, 8:29 AM

## 2017-06-30 ENCOUNTER — Encounter (HOSPITAL_COMMUNITY): Payer: BLUE CROSS/BLUE SHIELD

## 2017-06-30 LAB — LACTATE DEHYDROGENASE: LDH: 239 U/L — ABNORMAL HIGH (ref 98–192)

## 2017-06-30 LAB — CBC
HCT: 28.5 % — ABNORMAL LOW (ref 39.0–52.0)
Hemoglobin: 9.2 g/dL — ABNORMAL LOW (ref 13.0–17.0)
MCH: 25.9 pg — AB (ref 26.0–34.0)
MCHC: 32.3 g/dL (ref 30.0–36.0)
MCV: 80.3 fL (ref 78.0–100.0)
Platelets: 414 10*3/uL — ABNORMAL HIGH (ref 150–400)
RBC: 3.55 MIL/uL — ABNORMAL LOW (ref 4.22–5.81)
RDW: 17.7 % — AB (ref 11.5–15.5)
WBC: 12.5 10*3/uL — AB (ref 4.0–10.5)

## 2017-06-30 LAB — GLUCOSE, CAPILLARY
GLUCOSE-CAPILLARY: 122 mg/dL — AB (ref 65–99)
GLUCOSE-CAPILLARY: 130 mg/dL — AB (ref 65–99)
GLUCOSE-CAPILLARY: 171 mg/dL — AB (ref 65–99)
GLUCOSE-CAPILLARY: 172 mg/dL — AB (ref 65–99)
Glucose-Capillary: 129 mg/dL — ABNORMAL HIGH (ref 65–99)
Glucose-Capillary: 217 mg/dL — ABNORMAL HIGH (ref 65–99)

## 2017-06-30 LAB — BASIC METABOLIC PANEL
Anion gap: 12 (ref 5–15)
BUN: 29 mg/dL — AB (ref 6–20)
CALCIUM: 8.4 mg/dL — AB (ref 8.9–10.3)
CHLORIDE: 93 mmol/L — AB (ref 101–111)
CO2: 27 mmol/L (ref 22–32)
Creatinine, Ser: 5.2 mg/dL — ABNORMAL HIGH (ref 0.61–1.24)
GFR calc non Af Amer: 12 mL/min — ABNORMAL LOW (ref >60)
GFR, EST AFRICAN AMERICAN: 13 mL/min — AB (ref >60)
Glucose, Bld: 125 mg/dL — ABNORMAL HIGH (ref 65–99)
Potassium: 3.3 mmol/L — ABNORMAL LOW (ref 3.5–5.1)
SODIUM: 132 mmol/L — AB (ref 135–145)

## 2017-06-30 LAB — PROTIME-INR
INR: 2.06
Prothrombin Time: 23.1 seconds — ABNORMAL HIGH (ref 11.4–15.2)

## 2017-06-30 LAB — HEPARIN LEVEL (UNFRACTIONATED): Heparin Unfractionated: 0.46 IU/mL (ref 0.30–0.70)

## 2017-06-30 MED ORDER — POTASSIUM CHLORIDE CRYS ER 20 MEQ PO TBCR
40.0000 meq | EXTENDED_RELEASE_TABLET | Freq: Once | ORAL | Status: AC
  Administered 2017-06-30: 40 meq via ORAL
  Filled 2017-06-30: qty 2

## 2017-06-30 MED ORDER — HYDRALAZINE HCL 50 MG PO TABS
50.0000 mg | ORAL_TABLET | Freq: Three times a day (TID) | ORAL | Status: DC
  Administered 2017-06-30 – 2017-07-01 (×5): 50 mg via ORAL
  Filled 2017-06-30 (×5): qty 1

## 2017-06-30 MED ORDER — WARFARIN SODIUM 5 MG PO TABS
5.0000 mg | ORAL_TABLET | Freq: Once | ORAL | Status: AC
  Administered 2017-06-30: 5 mg via ORAL
  Filled 2017-06-30: qty 1

## 2017-06-30 MED ORDER — HYDRALAZINE HCL 25 MG PO TABS: 37.5000 mg | ORAL_TABLET | Freq: Three times a day (TID) | ORAL | Status: DC

## 2017-06-30 NOTE — Progress Notes (Signed)
Patient ID: [name], male   DOB: 10/22/1965, 51 y.o.   MRN: [id number]   Advanced Heart Failure VAD Team Note  Subjective:    Admitted 10/3 for RHC/Swan implantation and optimization prior to LVAD.  HeartMate 3 VAD implanted 10/8.  CVVH begun 10/11 CVVH stopped 10/15 Started iHD 10/17 (now TTS) PermCath and R AV fistula placed 06/26/17 Ramp ECHO 10/24 Speed increased to 5600 Left thoracentesis 10/24  No complaints this am. Denies SOB or lightheadedness/dizziness. Per Nurse, MAP 106 after return from dialysis yesterday. Dopplers running from 92-106.   INR 2.06. Stop heparin. LDH 239  Plan for HD M/W/F per [facility] at [facility])  LVAD INTERROGATION:  HeartMate 3:  Flow 4.2 liters/min, speed 5600 rpm, power 4.0 , PI 3.7. No PI events.   Objective:    Vital Signs:   Temp:  [98 F (36.7 C)-98.5 F (36.9 C)] 98.2 F (36.8 C) (10/26 0343) Pulse Rate:  [82-92] 88 (10/26 0343) Resp:  [13-25] 21 (10/26 0747) BP: (95-136)/(66-105) 113/93 (10/26 0747) SpO2:  [94 %-97 %] 96 % (10/26 0343) Weight:  [190 lb 7.6 oz (86.4 kg)-197 lb 15.6 oz (89.8 kg)] 191 lb 9.3 oz (86.9 kg) (10/26 0625) Last BM Date: 06/28/17 Mean arterial Pressure 90-100s  Intake/Output:   Intake/Output Summary (Last 24 hours) at 06/30/17 0815 Last data filed at 06/30/17 0600  Gross per 24 hour  Intake              516 ml  Output             3500 ml  Net            -2984 ml     Physical Exam: GENERAL: Well appearing this am. NAD.  HEENT: Normal. NECK: Supple, JVP 8-9 cm. Carotids OK.  CARDIAC:  Mechanical heart sounds with LVAD hum present.  LUNGS:  CTAB, normal effort.  ABDOMEN:  NT, ND, no HSM. No bruits or masses. +BS  LVAD exit site: Dressing dry and intact. No erythema or drainage. Stabilization device present and accurately applied. Driveline dressing changed daily per sterile technique. EXTREMITIES:  Warm and dry. No cyanosis, clubbing, rash, or edema.  NEUROLOGIC:  Alert &  oriented x 3. Cranial nerves grossly intact. Moves all 4 extremities w/o difficulty. Affect pleasant      Telemetry   NSR 80-90s, Personally reviewed.   Labs   Basic Metabolic Panel:  Recent Labs Lab 06/24/17 0316 06/25/17 0349 06/26/17 0305 06/27/17 0418  06/28/17 0505 06/29/17 0745 06/29/17 0945 06/30/17 0305  NA 128* 133* 129*  130* 130*  --  131* 131* 132* 132*  K 4.1 4.2 3.7  3.7 4.5  --  3.6 3.8 3.7 3.3*  CL 92* 97* 96*  97* 96*  --  94* 95* 96* 93*  CO2 24 22 24  24  20*  --  24 21* 23 27  GLUCOSE 185* 187* 149*  150* 156*  --  158* 121* 117* 125*  BUN 33* 27* 38*  39* 49*  --  28* 47* 48* 29*  CREATININE 6.63* 5.56* 7.52*  7.50* 9.04*  --  6.13* 8.07* 8.29* 5.20*  CALCIUM 8.2* 8.3* 8.4*  8.4* 8.6*  < > 8.6* 8.3* 8.4* 8.4*  MG 2.1 1.8 2.1  --   --   --   --   --   --   PHOS  --  3.5 3.6 4.7*  --  4.2  --  5.0*  --   < > =  values in this interval not displayed. Liver Function Tests:  Recent Labs Lab 06/24/17 0316 06/25/17 0349 06/26/17 0305 06/27/17 0418 06/28/17 0505 06/29/17 0945  AST 24  --   --   --   --   --   ALT 16*  --   --   --   --   --   ALKPHOS 136*  --   --   --   --   --   BILITOT 0.8  --   --   --   --   --   PROT 7.0  --   --   --   --   --   ALBUMIN 2.3* 2.3* 2.3* 2.4* 2.3* 2.3*   No results for input(s): LIPASE, AMYLASE in the last 168 hours. No results for input(s): AMMONIA in the last 168 hours.  CBC:  Recent Labs Lab 06/27/17 0418 06/28/17 0505 06/29/17 0247 06/29/17 0946 06/30/17 0305  WBC 16.3* 14.4* 13.4* 14.5* 12.5*  HGB 9.6* 9.1* 9.2* 8.9* 9.2*  HCT 29.0* 28.2* 28.3* 27.7* 28.5*  MCV 80.3 80.8 80.9 80.3 80.3  PLT 449* 441* 450* 446* 414*   INR:  Recent Labs Lab 06/26/17 0305 06/27/17 0418 06/28/17 0505 06/29/17 0247 06/30/17 0305  INR 1.39 1.34 1.48 1.86 2.06   Other results:  Imaging   Dg Chest 1 View  Result Date: 06/28/2017 CLINICAL DATA:  Status post left-sided thoracentesis. EXAM: CHEST 1  VIEW COMPARISON:  06/27/2017. FINDINGS: Right chest wall dialysis catheter is noted with tips in the right atrium. Left ventricular assist device is noted. There is a left chest wall ICD with lead in the right atrial appendage and right ventricle. Moderate cardiac enlargement. Left pleural effusion is identified. No pneumothorax status post left thoracentesis. IMPRESSION: 1. No pneumothorax visualized status post left thoracentesis. Electronically Signed   By: Kerby Moors M.D.   On: 06/28/2017 15:18   Dg Chest Port 1 View  Result Date: 06/29/2017 CLINICAL DATA:  Left ventricular assist device. EXAM: PORTABLE CHEST 1 VIEW COMPARISON:  06/28/2017 . FINDINGS: Dual-lumen right IJ catheter noted with lead tip over the right ventricle. Cardiac pacer with lead tips over the right atrium right ventricle. Left ventricular assist device noted. Stable cardiomegaly. Persistent low lung volumes with mild bibasilar atelectasis/infiltrates. Persistent small left pleural effusion cannot be excluded. IMPRESSION: 1. Dual-lumen right IJ catheter with lead tip over the right atrium. 2. Prior CABG. Left ventricular assist device noted in stable position. Stable cardiomegaly. 2. Persistent low lung volumes with persistent basilar atelectasis/infiltrates. No change. Electronically Signed   By: Marcello Moores  Register   On: 06/29/2017 07:57   Ir Thoracentesis Asp Pleural Space W/img Guide  Result Date: 06/28/2017 INDICATION: Patient status post LVAD placement 06/15/2017. Now with left pleural effusion. Request is made for diagnostic and therapeutic thoracentesis. EXAM: ULTRASOUND GUIDED DIAGNOSTIC AND THERAPEUTIC LEFT THORACENTESIS MEDICATIONS: 10 mL 1% lidocaine COMPLICATIONS: None immediate. PROCEDURE: An ultrasound guided thoracentesis was thoroughly discussed with the patient and questions answered. The benefits, risks, alternatives and complications were also discussed. The patient understands and wishes to proceed with the  procedure. Written consent was obtained. Ultrasound was performed to localize and mark an adequate pocket of fluid in the left chest. The area was then prepped and draped in the normal sterile fashion. 1% Lidocaine was used for local anesthesia. Under ultrasound guidance a Safe-T-Centesis catheter was introduced. Thoracentesis was performed. The catheter was removed and a dressing applied. FINDINGS: A total of approximately 1.0 liters of blood-tinged  fluid was removed. Samples were sent to the laboratory as requested by the clinical team. IMPRESSION: Successful ultrasound guided diagnostic and therapeutic left thoracentesis yielding 1.0 liters of pleural fluid. Read by:  Brynda Greathouse PA-C Electronically Signed   By: Jerilynn Mages.  Shick M.D.   On: 06/28/2017 15:27    Medications:     Scheduled Medications: . amiodarone  200 mg Oral BID  . aspirin EC  325 mg Oral Daily  . atorvastatin  80 mg Oral q1800  . bisacodyl  10 mg Oral Daily  . calcitRIOL  0.5 mcg Oral Q T,Th,Sa-HD  . [START ON 07/01/2017] darbepoetin (ARANESP) injection - DIALYSIS  150 mcg Intravenous Q Sat-HD  . docusate sodium  200 mg Oral Daily  . feeding supplement (GLUCERNA SHAKE)  237 mL Oral TID BM  . feeding supplement (PRO-STAT SUGAR FREE 64)  30 mL Oral TID WC  . hydrALAZINE  37.5 mg Oral 3 times per day on Sun Mon Wed Fri  . hydrALAZINE  37.5 mg Oral Once per day on Tue Thu Sat  . insulin aspart  0-24 Units Subcutaneous Q4H  . multivitamin  1 tablet Oral QHS  . mupirocin ointment   Nasal BID  . pantoprazole  40 mg Oral Daily  . sildenafil  20 mg Oral TID  . sodium chloride flush  3 mL Intravenous Q12H  . warfarin  5 mg Oral ONCE-1800  . Warfarin - Pharmacist Dosing Inpatient   Does not apply q1800    Infusions: . ferric gluconate (FERRLECIT/NULECIT) IV Stopped (06/29/17 1426)    PRN Medications: hydrALAZINE, LORazepam, ondansetron (ZOFRAN) IV, oxyCODONE, promethazine, sodium chloride flush, traMADol  Patient Profile    51 yo with history of CAD s/p anterior MI in 2010 and chronic systolic CHF (ischemic cardiomyopathy) as well as CKD stage III now s/p HeartMate 3 VAD placement.   Assessment/Plan:    1. Acute on chronic systolic CHF: Ischemic cardiomyopathy. St Jude ICD. NSTEMI in March 2018 with DES to LAD and RCA, complicated by cardiogenic shock, low output requiring milrinone. Most recent echo 8/18 with EF 20-25%, moderate MR. He was admitted and started on milrinone with improved creatinine.  HeartMate 3 VAD placement on 10/8. Speed cut back to 5400 with onset of afib/RVR 10/11.  Milrinone stopped 10/19. No central line to check co-ox. Will continue sildenafil for RV support.   - INR 2.06 this am. Stop heparin. INR Goal 2-3 (aim slightly higher with h/o Factor V Leiden heterozygosity) and ASA 325. Will not lower aspirin dose with Factor V Leiden.  - MAPs remains in 90-100s. Including during dialysis yesterday. After dialysis, MAP remained elevated at 106.  - Change hydralazine to 37.5 mg TID every day. (Can back down to 25 TID if improves and needs more pressure on HD days.  - VAD parameters reviewed personally. Stable at this time.  2. AKI on CKD: No renal recovery, tolerating IHD.   - Permcath and AV fistula placed 06/26/17.  - Plan for outpatient HD M-W-F at [facility]. Likely home Monday after HD.  3. CAD: NSTEMI in 3/18. LHC with 99% ulcerated lesion proximal RCA with left to right collaterals, 95% mid LAD stenosis after mid LAD stent. s/p PCI to RCA and LAD on 11/25/16.  - No s/s of ischemia.    - Off plavix for LVAD.   - Continue statin.   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 Recent V/Q scan did not show acute or chronic PE.  He is a heterozygote  for Factor V Leiden, plan for ASA 325 and warfarin INR 2-3 with LVAD. No change. - INR 2.06. Stop heparin.  5. Chronic N/V/D with gastroparesis: Resolved.  6. DM: On Jardiance at home, will cover with sliding scale. Glucose stable.   7.  S/P Multiple Tooth Extractions 4-15, 19-23, and 27-29.  - Stable.  8. Atrial fibrillation:  - Maintaining amio 200 mg BID.  9. ID: CXR with LLL atelectasis versus infiltrate. Had course of abx for HCAP.   - He is off vancomycin, zosyn stopped 10/20. Afebrile.  10. Hyponatremia:  - 132 today. Free water restrict.  11. Anemia:  - Hgb 9.2 12. Left Pleural Effusion: S/P Thoracentesis 10/24 with 1 liter removed.  - Stable.  13. Hypokalemia - Give 40 meq potassium x 1.   I reviewed the LVAD parameters from today, and compared the results to the patient's prior recorded data.  No programming changes were made.  The LVAD is functioning within specified parameters.  The patient performs LVAD self-test daily.  LVAD interrogation was negative for any significant power changes, alarms or PI events/speed drops.  LVAD equipment check completed and is in good working order.  Back-up equipment present.   LVAD education done on emergency procedures and precautions and reviewed exit site care.   Disposition: Home likely Monday. HHPT/HHRN ordered. Will need CM to check on cost of revatio. Consult placed.   Shirley Friar, PA-C  06/30/2017 8:15 AM   VAD Team Pager [phone] (7am - 7am)  Advanced Heart Failure Team Pager [phone] (M-F; [place])  Please contact [facility] for night-coverage after hours (4p -7a ) and weekends on [email or web address]  Patient seen with PA, agree with the above note.  He is doing well symptomatically, getting stronger.  Had a good walk with cardiac rehab.    INR now > 2, can stop heparin gtt.   Goal MAP 70-90 for ideal function of his LVAD.  He is consistently in the 90s to low 100s.  He did not drop MAP with HD yesterday.  Will increase hydralazine to 50 mg tid.   Gokul Waybright 06/30/2017 12:47 PM

## 2017-06-30 NOTE — Progress Notes (Signed)
Occupational Therapy Treatment Patient Details Name: [name] MRN: [id number] DOB: Nov 05, 1965 Today's Date: 06/30/2017    History of present illness 51 yo with history of CAD s/p anterior MI in 2010 and chronic systolic CHF (ischemic cardiomyopathy) as well as CKD stage III now s/p HeartMate 3 VAD placement.  PMH includes:  ASCVD,  CHF, Chron's disease, DVT, gastroparesis; HTN, ischemic cardiomyopathy DM; s/p cardiac debriblator placement.    OT comments  Pt is making excellent progress toward goals.  He is now able to perform ADLs with min guard assist with exception of min A for toilet transfer due to low height toilet seat.  He is able to verbalize independence with managing VAD equipment during ADLs.   He ambulated ~338f with min guard assist.   Follow Up Recommendations  Home health OT;Supervision/Assistance - 24 hour    Equipment Recommendations  None recommended by OT    Recommendations for Other Services      Precautions / Restrictions Precautions Precautions: Fall;Sternal Precaution Comments: LVAD       Mobility Bed Mobility               General bed mobility comments: sitting up in chair   Transfers Overall transfer level: Needs assistance   Transfers: Sit to/from Stand;Stand Pivot Transfers Sit to Stand: Min guard Stand pivot transfers: Min guard            Balance Overall balance assessment: Needs assistance Sitting-balance support: No upper extremity supported;Feet supported Sitting balance-[name]: Good     Standing balance support: During functional activity;No upper extremity supported Standing balance-[name]: Fair                             ADL either performed or assessed with clinical judgement   ADL Overall ADL's : Needs assistance/impaired Eating/Feeding: Independent;Sitting   Grooming: Wash/dry hands;Wash/dry face;Brushing hair;Min guard;Standing   Upper Body Bathing: Set up;Supervision/  safety;Sitting   Lower Body Bathing: Min guard;Sit to/from stand   Upper Body Dressing : Set up;Supervision/safety;Sitting   Lower Body Dressing: Min guard;Sit to/from stand Lower Body Dressing Details (indicate cue type and reason): Pt able to cross ankles over knees to access feet  Toilet Transfer: Minimal assistance;Regular Toilet;RW Toilet Transfer Details (indicate cue type and reason): discussed use of 3in1 commode for home use, however, pt states son will assist him until he is able to stand mod I.  He does not care for 3in1  Toileting- Clothing Manipulation and Hygiene: Min guard;Sit to/from stand       Functional mobility during ADLs: Min guard;Rolling walker General ADL Comments: Pt demonstrates good safety awareness with ADLs.  Encouraged him to start to perform own sponge bath, and encouraged family to bring in clothes from home.  Pt able to teach back VAD precautions and sternal precautions as they relate to ADLs      Vision       Perception     Praxis      Cognition Arousal/Alertness: Awake/alert Behavior During Therapy: [facility] tasks assessed/performed Overall Cognitive Status: Within Functional Limits for tasks assessed                                          Exercises     Shoulder Instructions       General Comments VSS.  Pt was already  on batteries.  He is able to verbalize need for black back and what goes in it, and was able to instruct therapist in precautions as they relate to ADL ativities     Pertinent Vitals/ Pain       Pain Assessment: Faces Faces Pain Scale: No hurt  Home Living                                          Prior Functioning/Environment              Frequency  Min 3X/week        Progress Toward Goals  OT Goals(current goals can now be found in the care plan section)  Progress towards OT goals: Progressing toward goals     Plan Discharge plan remains appropriate;Equipment  recommendations need to be updated    Co-evaluation                 AM-PAC PT "6 Clicks" Daily Activity     Outcome Measure   Help from another person eating meals?: None Help from another person taking care of personal grooming?: A Little Help from another person toileting, which includes using toliet, bedpan, or urinal?: A Little Help from another person bathing (including washing, rinsing, drying)?: A Little Help from another person to put on and taking off regular upper body clothing?: A Little Help from another person to put on and taking off regular lower body clothing?: A Little 6 Click Score: 19    End of Session Equipment Utilized During Treatment: Rolling walker  OT Visit Diagnosis: Unsteadiness on feet (R26.81);Pain   Activity Tolerance Patient tolerated treatment well   Patient Left in chair;with call bell/phone within reach;with family/visitor present   Nurse Communication Mobility status        Time: 9169-4503 OT Time Calculation (min): 13 min  Charges: OT General Charges $OT Visit: 1 Visit OT Treatments $Therapeutic Activity: 8-22 mins  [facility], OTR/L [phone]    Lucille Passy M 06/30/2017, 1:08 PM

## 2017-06-30 NOTE — Care Management Note (Signed)
Case Management Note Previous Note Created by [name]  Patient Details  Name: [name] MRN: [id number] Date of Birth: 1966/04/07  Subjective/Objective:     From home, post op LVAD, conts on vent.   10/10 [place], BSN - POD2 LVAD implant, extubated, conts on lasix drip, inotropes, norepi, dopamine and epi.  PT eval rec HHPT , NCM will cont to follow progression.      10/19 [place], BSN - milrinone stopped today, aim for dc Monday or Tuesday, will need [place] set up next week.   10/22 [name] RN, BSN - conts to be oliguric with rising CVP, conts on heparin drip, s/p tunneled catheter and av fistula today for HD.  Plan is [facility] with [facility] when stable.    10/24 11:10 [name] RN, BSN - patient chose [facility] for HHPT/HHOT, and rolling walker, referral made to [facility] with [facility] , soc will begin 24-48hrs post dc.  Patient's wife does dressing changes for him,  He will have HD on M-W-F, Clip in process.   S/P L thoracentesis for post op pl effusion.  Conts on Heparin/coumadin bridge.  He will be [facility] with [facility] also. NCM contacted [place] with  [facility] , she will make sure orders get put in.                             Action/Plan: NCM will follow LVAD patient  for dc needs.  Expected Discharge Date:                  Expected Discharge Plan:  [place]  In-House Referral:     Discharge planning Services  CM Consult  Post Acute Care Choice:  Durable Medical Equipment, Home Health Choice offered to:  Patient  DME Arranged:    DME Agency:  [place] Arranged:  PT, OT [facility] Agency:  [place]  Status of Service:  Completed, signed off  If discussed at [place] of Stay Meetings, dates discussed:    Additional Comments: 06/30/2017 CM spoke with pt, daughter and son.  Pt will have 24 hour supervision at discharge.  [facility] continues to follow for discharge -  tentative Monday 07/03/17 Maryclare Labrador, RN 06/30/2017, 4:03 PM

## 2017-06-30 NOTE — Progress Notes (Addendum)
Physical Therapy Treatment Patient Details Name: [name] MRN: [id number] DOB: 1966/02/22 Today's Date: 06/30/2017    History of Present Illness 51 yo with history of CAD s/p anterior MI in 2010 and chronic systolic CHF (ischemic cardiomyopathy) as well as CKD stage III now s/p HeartMate 3 VAD placement.  PMH includes:  ASCVD,  CHF, Chron's disease, DVT, gastroparesis; HTN, ischemic cardiomyopathy DM; s/p cardiac debriblator placement.     PT Comments    Pt admitted with above diagnosis. Pt currently with functional limitations due to balance and endurance deficits.  Pt was able to ascend and descend 1 step and was too nervous to do a second step.  Pt states son will help pt.  Also discussed that pt could get a rental ramp.  Therapist on Monday to bring ramp handout and to practice with son and pt.  Goals close to being met but did not meet.  Revised goals for 1 week.  Pt will benefit from skilled PT to increase their independence and safety with mobility to allow discharge to the venue listed below.     Follow Up Recommendations  Home health PT;Supervision/Assistance - 24 hour     Equipment Recommendations  Other (comment) (rollator)    Recommendations for Other Services       Precautions / Restrictions Precautions Precautions: Fall;Sternal Precaution Comments: LVAD Restrictions Weight Bearing Restrictions: Yes    Mobility  Bed Mobility               General bed mobility comments: sitting up in chair   Transfers Overall transfer level: Needs assistance Equipment used: Rolling walker (2 wheeled) Transfers: Sit to/from Stand;Stand Pivot Transfers Sit to Stand: Min guard         General transfer comment: min guard and did not need cues for hand placement  Ambulation/Gait Ambulation/Gait assistance: Min guard;Supervision Ambulation Distance (Feet): 400 Feet Assistive device: Rolling walker (2 wheeled) Gait Pattern/deviations: Step-through  pattern;Decreased stride length;Wide base of support Gait velocity: decreased Gait velocity interpretation: Below normal speed for age/gender General Gait Details: pt with slow, steady gait   Stairs Stairs: Yes   Stair Management: Step to pattern;Backwards;With walker Number of Stairs: 1 General stair comments: Pt went up one step and it was fairly difficult for pt to lift his feet up as he was nervous.  He did not push too much on his UES but was nervous therefore he only went up and down 1 step and not 2.  Demonstrated 2 steps wtih RW to pt so he could see how to do it but he wanted to wait and practice with son on Monday.    Wheelchair Mobility    Modified Rankin (Stroke Patients Only)       Balance Overall balance assessment: Needs assistance         Standing balance support: During functional activity;No upper extremity supported Standing balance-[name]: Fair Standing balance comment: pt is able to static stand without UE support                            Cognition Arousal/Alertness: Awake/alert Behavior During Therapy: WFL for tasks assessed/performed Overall Cognitive Status: Within Functional Limits for tasks assessed                                        Exercises General Exercises - Lower Extremity  Long Arc Quad: AROM;Both;Seated;10 reps    General Comments General comments (skin integrity, edema, etc.): VSS. Pt on batteries on arrival.  Able to state he needed black bag.       Pertinent Vitals/Pain Pain Assessment: No/denies pain    Home Living                      Prior Function            PT Goals (current goals can now be found in the care plan section) Acute Rehab PT Goals Patient Stated Goal: to go home PT Goal Formulation: With patient Time For Goal Achievement: 07/07/17 Potential to Achieve Goals: Good Progress towards PT goals: Progressing toward goals    Frequency    Min 3X/week       PT Plan Current plan remains appropriate    Co-evaluation              AM-PAC PT "6 Clicks" Daily Activity  Outcome Measure  Difficulty turning over in bed (including adjusting bedclothes, sheets and blankets)?: None Difficulty moving from lying on back to sitting on the side of the bed? : A Lot Difficulty sitting down on and standing up from a chair with arms (e.g., wheelchair, bedside commode, etc,.)?: Unable Help needed moving to and from a bed to chair (including a wheelchair)?: A Little Help needed walking in hospital room?: A Little Help needed climbing 3-5 steps with a railing? : A Lot 6 Click Score: 15    End of Session Equipment Utilized During Treatment: Gait belt Activity Tolerance: Patient tolerated treatment well Patient left: with call bell/phone within reach;in chair Nurse Communication: Mobility status PT Visit Diagnosis: Unsteadiness on feet (R26.81);Muscle weakness (generalized) (M62.81)     Time: 0223-3612 PT Time Calculation (min) (ACUTE ONLY): 16 min  Charges:  $Gait Training: 8-22 mins                    G Codes:       [name],PT Acute Rehabilitation [phone] (pager)    [name] 06/30/2017, 5:18 PM

## 2017-06-30 NOTE — Progress Notes (Signed)
Subjective: Interval History: has no complaint, discussed diet, HD.  Objective: Vital signs in last 24 hours: Temp:  [98 F (36.7 C)-98.5 F (36.9 C)] 98.2 F (36.8 C) (10/26 0343) Pulse Rate:  [82-92] 88 (10/26 0343) Resp:  [13-25] 14 (10/26 0343) BP: (95-136)/(66-105) 109/69 (10/26 0343) SpO2:  [94 %-97 %] 96 % (10/26 0343) Weight:  [86.4 kg (190 lb 7.6 oz)-89.8 kg (197 lb 15.6 oz)] 86.9 kg (191 lb 9.3 oz) (10/26 0625) Weight change: -0.012 kg (-0.4 oz)  Intake/Output from previous day: 10/25 0701 - 10/26 0700 In: 516 [P.O.:240; I.V.:276] Out: 3500  Intake/Output this shift: No intake/output data recorded.  General appearance: alert, cooperative, no distress and mildly obese Resp: rales bibasilar Chest wall: RIJ cath Cardio: cont hum GI: soft, nontender, LVAD tubes LUQ, pos bs Extremities: 1+ edema, AVF RUA  Lab Results:  Recent Labs  06/29/17 0946 06/30/17 0305  WBC 14.5* 12.5*  HGB 8.9* 9.2*  HCT 27.7* 28.5*  PLT 446* 414*   BMET:  Recent Labs  06/29/17 0945 06/30/17 0305  NA 132* 132*  K 3.7 3.3*  CL 96* 93*  CO2 23 27  GLUCOSE 117* 125*  BUN 48* 29*  CREATININE 8.29* 5.20*  CALCIUM 8.4* 8.4*    Recent Labs  06/27/17 1152  PTH 168*  Comment   Iron Studies:  Recent Labs  06/27/17 1152  IRON 15*  TIBC 251    Studies/Results: Dg Chest 1 View  Result Date: 06/28/2017 CLINICAL DATA:  Status post left-sided thoracentesis. EXAM: CHEST 1 VIEW COMPARISON:  06/27/2017. FINDINGS: Right chest wall dialysis catheter is noted with tips in the right atrium. Left ventricular assist device is noted. There is a left chest wall ICD with lead in the right atrial appendage and right ventricle. Moderate cardiac enlargement. Left pleural effusion is identified. No pneumothorax status post left thoracentesis. IMPRESSION: 1. No pneumothorax visualized status post left thoracentesis. Electronically Signed   By: Kerby Moors M.D.   On: 06/28/2017 15:18   Dg  Chest Port 1 View  Result Date: 06/29/2017 CLINICAL DATA:  Left ventricular assist device. EXAM: PORTABLE CHEST 1 VIEW COMPARISON:  06/28/2017 . FINDINGS: Dual-lumen right IJ catheter noted with lead tip over the right ventricle. Cardiac pacer with lead tips over the right atrium right ventricle. Left ventricular assist device noted. Stable cardiomegaly. Persistent low lung volumes with mild bibasilar atelectasis/infiltrates. Persistent small left pleural effusion cannot be excluded. IMPRESSION: 1. Dual-lumen right IJ catheter with lead tip over the right atrium. 2. Prior CABG. Left ventricular assist device noted in stable position. Stable cardiomegaly. 2. Persistent low lung volumes with persistent basilar atelectasis/infiltrates. No change. Electronically Signed   By: Marcello Moores  Register   On: 06/29/2017 07:57   Ir Thoracentesis Asp Pleural Space W/img Guide  Result Date: 06/28/2017 INDICATION: Patient status post LVAD placement 06/15/2017. Now with left pleural effusion. Request is made for diagnostic and therapeutic thoracentesis. EXAM: ULTRASOUND GUIDED DIAGNOSTIC AND THERAPEUTIC LEFT THORACENTESIS MEDICATIONS: 10 mL 1% lidocaine COMPLICATIONS: None immediate. PROCEDURE: An ultrasound guided thoracentesis was thoroughly discussed with the patient and questions answered. The benefits, risks, alternatives and complications were also discussed. The patient understands and wishes to proceed with the procedure. Written consent was obtained. Ultrasound was performed to localize and mark an adequate pocket of fluid in the left chest. The area was then prepped and draped in the normal sterile fashion. 1% Lidocaine was used for local anesthesia. Under ultrasound guidance a Safe-T-Centesis catheter was introduced. Thoracentesis was performed. The  catheter was removed and a dressing applied. FINDINGS: A total of approximately 1.0 liters of blood-tinged fluid was removed. Samples were sent to the laboratory as  requested by the clinical team. IMPRESSION: Successful ultrasound guided diagnostic and therapeutic left thoracentesis yielding 1.0 liters of pleural fluid. Read by:  Brynda Greathouse PA-C Electronically Signed   By: Jerilynn Mages.  Shick M.D.   On: 06/28/2017 15:27    I have reviewed the patient's current medications.  Assessment/Plan: 1 ESRD vol improving cont to challenge.  Discussed. If bp drops stop hydral as not proven to help HF in ESRD.till some hope of recovery 2 DM controlled 3 anemia esa/Fe 4 HPTH  Vit D 5 CM 6 CAD 7 Hx DVT P Hd on Sat, plan D/C to [facility] on Mon   LOS: 23 days   Arzu Mcgaughey L 06/30/2017,7:08 AM

## 2017-06-30 NOTE — Progress Notes (Signed)
[place] for Coumadin/Heparin Indication: LVAD, h/o DVT, Factor V Leiden heterozygosity  Allergies  Allergen Reactions  . Metformin And Related Diarrhea    Patient Measurements: Height: 5' 8"  (172.7 cm) Weight: 191 lb 9.3 oz (86.9 kg) IBW/kg (Calculated) : 68.4   Vital Signs: Temp: 98.2 F (36.8 C) (10/26 0343) Temp Source: Oral (10/26 0343) BP: 109/69 (10/26 0343) Pulse Rate: 88 (10/26 0343)  Labs:  Recent Labs  06/28/17 0505 06/29/17 0247 06/29/17 0745 06/29/17 0945 06/29/17 0946 06/29/17 1051 06/30/17 0305  HGB 9.1* 9.2*  --   --  8.9*  --  9.2*  HCT 28.2* 28.3*  --   --  27.7*  --  28.5*  PLT 441* 450*  --   --  446*  --  414*  LABPROT 17.8* 21.3*  --   --   --   --  23.1*  INR 1.48 1.86  --   --   --   --  2.06  HEPARINUNFRC 0.25* 0.40  --   --   --  0.47 0.46  CREATININE 6.13*  --  8.07* 8.29*  --   --  5.20*    Estimated Creatinine Clearance: 17.8 mL/min (A) (by C-G formula based on SCr of 5.2 mg/dL (H)).   Medical History: Past Medical History:  Diagnosis Date  . Angina   . ASCVD (arteriosclerotic cardiovascular disease)    , Anterior infarction 2005, LAD diagonal bifurcation intervention 03/2004  . Automatic implantable cardiac defibrillator -St. Jude's       . Benign neoplasm of colon   . CHF (congestive heart failure) ([place])   . Chronic systolic heart failure ([place])   . Coronary artery disease     Widely patent previously placed stents in the left anterior   . Crohn's disease ([place])   . 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 Dyspnea   . Gastroparesis   . GERD (gastroesophageal reflux disease)   . High cholesterol   . Hyperlipidemia   . Hypersomnolent    Previous diagnosis of narcolepsy  . Hypertension, essential   . Ischemic cardiomyopathy    Ejection fraction 15-20% catheterization 2010  . Type II or unspecified type diabetes mellitus without mention of complication, not stated as  uncontrolled   . Unspecified gastritis and gastroduodenitis without mention of hemorrhage     Assessment: 35 yoM on chronic Coumadin for Factor 5 Leiden and hx DVTs admitted for LVAD placement. INR therapeutic today at 2.06, will stop heparin bridge.  Goal of Therapy:  INR 2-3  Monitor platelets by anticoagulation protocol: Yes   Plan:  -Coumadin 20m PO x1 tonight -Stop heparin  -Daily INR, LDH, CBC  MArrie Senate PharmD PGY-2 Cardiology Pharmacy Resident Pager: [phone]/26/2018

## 2017-06-30 NOTE — Progress Notes (Signed)
CARDIAC REHAB PHASE I   PRE:  Rate/Rhythm: 84 SR    BP: sitting 104 dopplar    SaO2: 95 RA  MODE:  Ambulation: 260 ft   POST:  Rate/Rhythm: 98 SR    BP: sitting 118 dopplar     SaO2: 93 RA  Pt able to don batteries, stand with min assist, and walk with rollator. Steady, no c/o walking. Used x2 assist for safety but can be x1 next walk. Return to room due to "beginning to fatigue". To recliner again. BP elevated, just got meds. Pt stayed on batteries. Doing very well. Wants to walk more today. Will f/u tomorrow. [place], ACSM 06/30/2017 8:55 AM

## 2017-07-01 LAB — RENAL FUNCTION PANEL
ALBUMIN: 2.3 g/dL — AB (ref 3.5–5.0)
Anion gap: 11 (ref 5–15)
BUN: 51 mg/dL — AB (ref 6–20)
CALCIUM: 8.6 mg/dL — AB (ref 8.9–10.3)
CO2: 25 mmol/L (ref 22–32)
CREATININE: 7.07 mg/dL — AB (ref 0.61–1.24)
Chloride: 95 mmol/L — ABNORMAL LOW (ref 101–111)
GFR calc Af Amer: 9 mL/min — ABNORMAL LOW (ref >60)
GFR, EST NON AFRICAN AMERICAN: 8 mL/min — AB (ref >60)
Glucose, Bld: 147 mg/dL — ABNORMAL HIGH (ref 65–99)
PHOSPHORUS: 4.1 mg/dL (ref 2.5–4.6)
Potassium: 3.7 mmol/L (ref 3.5–5.1)
SODIUM: 131 mmol/L — AB (ref 135–145)

## 2017-07-01 LAB — GLUCOSE, CAPILLARY
GLUCOSE-CAPILLARY: 144 mg/dL — AB (ref 65–99)
GLUCOSE-CAPILLARY: 145 mg/dL — AB (ref 65–99)
GLUCOSE-CAPILLARY: 188 mg/dL — AB (ref 65–99)
Glucose-Capillary: 209 mg/dL — ABNORMAL HIGH (ref 65–99)

## 2017-07-01 LAB — CBC
HCT: 29.6 % — ABNORMAL LOW (ref 39.0–52.0)
Hemoglobin: 9.7 g/dL — ABNORMAL LOW (ref 13.0–17.0)
MCH: 26.4 pg (ref 26.0–34.0)
MCHC: 32.8 g/dL (ref 30.0–36.0)
MCV: 80.4 fL (ref 78.0–100.0)
PLATELETS: 345 10*3/uL (ref 150–400)
RBC: 3.68 MIL/uL — ABNORMAL LOW (ref 4.22–5.81)
RDW: 18.1 % — AB (ref 11.5–15.5)
WBC: 9.3 10*3/uL (ref 4.0–10.5)

## 2017-07-01 LAB — LACTATE DEHYDROGENASE: LDH: 225 U/L — AB (ref 98–192)

## 2017-07-01 LAB — PROTIME-INR
INR: 2
PROTHROMBIN TIME: 22.5 s — AB (ref 11.4–15.2)

## 2017-07-01 MED ORDER — DARBEPOETIN ALFA 150 MCG/0.3ML IJ SOSY
PREFILLED_SYRINGE | INTRAMUSCULAR | Status: AC
  Filled 2017-07-01: qty 0.3

## 2017-07-01 MED ORDER — HEPARIN SODIUM (PORCINE) 1000 UNIT/ML DIALYSIS: 100.0000 [IU]/kg | INTRAMUSCULAR | Status: DC | PRN

## 2017-07-01 MED ORDER — WARFARIN SODIUM 7.5 MG PO TABS
7.5000 mg | ORAL_TABLET | Freq: Once | ORAL | Status: AC
  Administered 2017-07-01: 7.5 mg via ORAL
  Filled 2017-07-01: qty 1

## 2017-07-01 MED ORDER — SODIUM CHLORIDE 0.9 % IV SOLN: 100.0000 mL | INTRAVENOUS | Status: DC | PRN

## 2017-07-01 MED ORDER — AMIODARONE HCL 200 MG PO TABS
200.0000 mg | ORAL_TABLET | Freq: Every day | ORAL | Status: DC
  Administered 2017-07-02 – 2017-07-03 (×2): 200 mg via ORAL
  Filled 2017-07-01 (×2): qty 1

## 2017-07-01 MED ORDER — ALTEPLASE 2 MG IJ SOLR: 2.0000 mg | Freq: Once | INTRAMUSCULAR | Status: DC | PRN

## 2017-07-01 MED ORDER — CALCITRIOL 0.5 MCG PO CAPS
ORAL_CAPSULE | ORAL | Status: AC
  Administered 2017-07-01: 0.5 ug via ORAL
  Filled 2017-07-01: qty 1

## 2017-07-01 MED ORDER — LIDOCAINE-PRILOCAINE 2.5-2.5 % EX CREA: 1.0000 "application " | TOPICAL_CREAM | CUTANEOUS | Status: DC | PRN

## 2017-07-01 MED ORDER — HEPARIN SODIUM (PORCINE) 1000 UNIT/ML DIALYSIS: 1000.0000 [IU] | INTRAMUSCULAR | Status: DC | PRN

## 2017-07-01 MED ORDER — LIDOCAINE HCL (PF) 1 % IJ SOLN: 5.0000 mL | INTRAMUSCULAR | Status: DC | PRN

## 2017-07-01 MED ORDER — PENTAFLUOROPROP-TETRAFLUOROETH EX AERO: 1.0000 "application " | INHALATION_SPRAY | CUTANEOUS | Status: DC | PRN

## 2017-07-01 NOTE — Procedures (Signed)
I was present at this session.  I have reviewed the session itself and made appropriate changes.  HD via PC bp, 90-110  [name] L 10/27/201811:12 AM

## 2017-07-01 NOTE — Progress Notes (Signed)
Subjective: Interval History: has no complaint , discussed ouptnt tx, Prob Wed start..  Objective: Vital signs in last 24 hours: Temp:  [97.7 F (36.5 C)-98.9 F (37.2 C)] 97.7 F (36.5 C) (10/27 0404) Pulse Rate:  [70-90] 70 (10/27 0404) Resp:  [16-26] 16 (10/27 0404) BP: (91-115)/(80-101) 102/90 (10/27 0404) SpO2:  [85 %-99 %] 85 % (10/27 0404) Weight:  [86.5 kg (190 lb 11.2 oz)] 86.5 kg (190 lb 11.2 oz) (10/27 0932) Weight change: -3.3 kg (-7 lb 4.4 oz)  Intake/Output from previous day: 10/26 0701 - 10/27 0700 In: 3557 [P.O.:1080; I.V.:10] Out: -  Intake/Output this shift: No intake/output data recorded.  General appearance: alert, cooperative, no distress and mildly obese Resp: rales LLL Chest wall: RIJ cath Cardio: cont hum,   GI: liver down 5 cm, pos bs, soft Extremities: edema 1+ and AVF RUA, Pacer [place]  LVAD tubes LUQ  Lab Results:  Recent Labs  06/30/17 0305 07/01/17 0239  WBC 12.5* 9.3  HGB 9.2* 9.7*  HCT 28.5* 29.6*  PLT 414* 345   BMET:  Recent Labs  06/30/17 0305 07/01/17 0239  NA 132* 131*  K 3.3* 3.7  CL 93* 95*  CO2 27 25  GLUCOSE 125* 147*  BUN 29* 51*  CREATININE 5.20* 7.07*  CALCIUM 8.4* 8.6*   No results for input(s): PTH in the last 72 hours. Iron Studies: No results for input(s): IRON, TIBC, TRANSFERRIN, FERRITIN in the last 72 hours.  Studies/Results: No results found.  I have reviewed the patient's current medications.  Assessment/Plan: 1 ESRD may  Regain some function. HD today, then Mon.  Determine if will use hydral and dose today and Mon 2 Anemia esa/Fe 3 HPTH vitD 4 CM per [facility].Cards 5 Dm controlled 6 [place] P HD, esa, Fe, vit D, DM control    LOS: 24 days   Raymond Azure L 07/01/2017,7:41 AM

## 2017-07-01 NOTE — Progress Notes (Addendum)
Patient ID: [name], male   DOB: 01-21-1966, 51 y.o.   MRN: [id number]   Advanced Heart Failure VAD Team Note  Subjective:    Admitted 10/3 for RHC/Swan implantation and optimization prior to LVAD.  HeartMate 3 VAD implanted 10/8.  CVVH begun 10/11 CVVH stopped 10/15 Started iHD 10/17 (now TTS) PermCath and R AV fistula placed 06/26/17 Ramp ECHO 10/24 Speed increased to 5600 Left thoracentesis 10/24  Seen in HD today. Tolerating well. No CP or SOB. Eager to go home. INR 2.0 No VAD alarms or bleeding  Plan for HD M/W/F per patient request at [facility])  LVAD INTERROGATION:  HeartMate 3:  Flow 4.3 liters/min, speed 5600 rpm, power 4.2, PI 4.1. No PI events. Personally reviewed   Objective:    Vital Signs:   Temp:  [97.7 F (36.5 C)-98.2 F (36.8 C)] 97.7 F (36.5 C) (10/27 0745) Pulse Rate:  [59-90] 84 (10/27 1000) Resp:  [16-26] 16 (10/27 0800) BP: (91-129)/(73-101) 127/86 (10/27 1000) SpO2:  [85 %-99 %] 97 % (10/27 0800) Weight:  [86.5 kg (190 lb 11.2 oz)-86.6 kg (190 lb 14.7 oz)] 86.6 kg (190 lb 14.7 oz) (10/27 0745) Last BM Date: 06/30/17 Mean arterial Pressure 90-100 Personally reviewed   Intake/Output:   Intake/Output Summary (Last 24 hours) at 07/01/17 1015 Last data filed at 07/01/17 0800  Gross per 24 hour  Intake              850 ml  Output                0 ml  Net              850 ml     Physical Exam: General:  NAD. Sitting in HD  HEENT: normal  Neck: supple. JVP not elevated.  Carotids 2+ bilat; no bruits. No lymphadenopathy or thryomegaly appreciated.  Cor: LVAD - normal HM 3 sounds  + HD cath Lungs: Clear. Abdomen: soft, nontender, non-distended. No hepatosplenomegaly. No bruits or masses. Good bowel sounds. Driveline site clean. Anchor in place.  Extremities: no cyanosis, clubbing, rash. Warm no edema  Neuro: alert & oriented x 3. No focal deficits. Moves all 4 without problem       Telemetry   NSR 80-90s. Personally  reviewed   Labs   Basic Metabolic Panel:  Recent Labs Lab 06/25/17 0349 06/26/17 0305 06/27/17 5631  06/28/17 0505 06/29/17 0745 06/29/17 0945 06/30/17 0305 07/01/17 0239  NA 133* 129*  130* 130*  --  131* 131* 132* 132* 131*  K 4.2 3.7  3.7 4.5  --  3.6 3.8 3.7 3.3* 3.7  CL 97* 96*  97* 96*  --  94* 95* 96* 93* 95*  CO2 22 24  24  20*  --  24 21* 23 27 25   GLUCOSE 187* 149*  150* 156*  --  158* 121* 117* 125* 147*  BUN 27* 38*  39* 49*  --  28* 47* 48* 29* 51*  CREATININE 5.56* 7.52*  7.50* 9.04*  --  6.13* 8.07* 8.29* 5.20* 7.07*  CALCIUM 8.3* 8.4*  8.4* 8.6*  < > 8.6* 8.3* 8.4* 8.4* 8.6*  MG 1.8 2.1  --   --   --   --   --   --   --   PHOS 3.5 3.6 4.7*  --  4.2  --  5.0*  --  4.1  < > = values in this interval not displayed. Liver Function Tests:  Recent Labs  Lab 06/26/17 0305 06/27/17 0418 06/28/17 0505 06/29/17 0945 07/01/17 0239  ALBUMIN 2.3* 2.4* 2.3* 2.3* 2.3*   No results for input(s): LIPASE, AMYLASE in the last 168 hours. No results for input(s): AMMONIA in the last 168 hours.  CBC:  Recent Labs Lab 06/28/17 0505 06/29/17 0247 06/29/17 0946 06/30/17 0305 07/01/17 0239  WBC 14.4* 13.4* 14.5* 12.5* 9.3  HGB 9.1* 9.2* 8.9* 9.2* 9.7*  HCT 28.2* 28.3* 27.7* 28.5* 29.6*  MCV 80.8 80.9 80.3 80.3 80.4  PLT 441* 450* 446* 414* 345   INR:  Recent Labs Lab 06/27/17 0418 06/28/17 0505 06/29/17 0247 06/30/17 0305 07/01/17 0239  INR 1.34 1.48 1.86 2.06 2.00   Other results:  Imaging   No results found.  Medications:     Scheduled Medications: . amiodarone  200 mg Oral BID  . aspirin EC  325 mg Oral Daily  . atorvastatin  80 mg Oral q1800  . bisacodyl  10 mg Oral Daily  . calcitRIOL  0.5 mcg Oral Q T,Th,Sa-HD  . darbepoetin (ARANESP) injection - DIALYSIS  150 mcg Intravenous Q Sat-HD  . docusate sodium  200 mg Oral Daily  . feeding supplement (GLUCERNA SHAKE)  237 mL Oral TID BM  . feeding supplement (PRO-STAT SUGAR FREE 64)  30  mL Oral TID WC  . hydrALAZINE  50 mg Oral TID  . insulin aspart  0-24 Units Subcutaneous Q4H  . multivitamin  1 tablet Oral QHS  . pantoprazole  40 mg Oral Daily  . sildenafil  20 mg Oral TID  . sodium chloride flush  3 mL Intravenous Q12H  . warfarin  7.5 mg Oral ONCE-1800  . Warfarin - Pharmacist Dosing Inpatient   Does not apply q1800    Infusions: . sodium chloride    . sodium chloride    . ferric gluconate (FERRLECIT/NULECIT) IV Stopped (06/29/17 1426)    PRN Medications: sodium chloride, sodium chloride, alteplase, heparin, heparin, hydrALAZINE, lidocaine (PF), lidocaine-prilocaine, LORazepam, ondansetron (ZOFRAN) IV, oxyCODONE, pentafluoroprop-tetrafluoroeth, promethazine, sodium chloride flush, traMADol  Patient Profile   51 yo with history of CAD s/p anterior MI in 2010 and chronic systolic CHF (ischemic cardiomyopathy) as well as CKD stage III now s/p HeartMate 3 VAD placement.   Assessment/Plan:    1. Acute on chronic systolic CHF: Ischemic cardiomyopathy. St Jude ICD. NSTEMI in March 2018 with DES to LAD and RCA, complicated by cardiogenic shock, low output requiring milrinone. Most recent echo 8/18 with EF 20-25%, moderate MR. He was admitted and started on milrinone with improved creatinine.  HeartMate 3 VAD placement on 10/8. Speed cut back to 5400 with onset of afib/RVR 10/11.  Milrinone stopped 10/19. No central line to check co-ox. Will continue sildenafil for RV support.   - INR 2.00 this am. Off heparin. INR Goal 2-3 (aim slightly higher with h/o Factor V Leiden heterozygosity) and ASA 325. - Will not lower aspirin dose with Factor V Leiden.  - MAPs remains in 90-100s. Including during dialysis yesterday. After dialysis, MAP remained elevated at 106.  - Increase  hydralazine to 50 mg TID (Can back downif improves and needs more pressure on HD days.  - VAD parameters Personally reviewed they are ok . Stable at this time.  2. AKI on CKD: No renal recovery,  tolerating IHD.   - Permcath and AV fistula placed 06/26/17.  - Plan for outpatient HD M-W-F at [facility]. Likely home Monday after HD.  3. CAD: NSTEMI in 3/18. LHC with 99% ulcerated lesion  proximal RCA with left to right collaterals, 95% mid LAD stenosis after mid LAD stent. s/p PCI to RCA and LAD on 11/25/16.  - No s/s of ischemia.    - Off plavix for LVAD.   - Continue statin.   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 Recent V/Q scan did not show acute or chronic PE.  He is a heterozygote for Factor V Leiden, plan for ASA 325 and warfarin INR 2-3 with LVAD. - INR 2.00. Off heparin.  5. Chronic N/V/D with gastroparesis: Resolved.  6. DM: On Jardiance at home, will cover with sliding scale. Glucose stable.   7. S/P Multiple Tooth Extractions 4-15, 19-23, and 27-29.  - Stable.  8. Atrial fibrillation:  - Maintaining NSR on amio 200 mg BID can decrease to 200 daily.  9. ID: CXR with LLL atelectasis versus infiltrate. Had course of abx for HCAP.   - He is off vancomycin, zosyn stopped 10/20. Afebrile.  10. Hyponatremia:  - 131 today. Free water restrict.  11. Anemia:  - Hgb 9.3 stable 12. Left Pleural Effusion: S/P Thoracentesis 10/24 with 1 liter removed.  - Stable.  13. Hypokalemia - Supp as needed  I reviewed the LVAD parameters from today, and compared the results to the patient's prior recorded data.  No programming changes were made.  The LVAD is functioning within specified parameters.  The patient performs LVAD self-test daily.  LVAD interrogation was negative for any significant power changes, alarms or PI events/speed drops.  LVAD equipment check completed and is in good working order.  Back-up equipment present.   LVAD education done on emergency procedures and precautions and reviewed exit site care.   Disposition: Home likely Monday. HHPT/HHRN ordered. Will need CM to check on cost of revatio. Consult placed.   [name], MD  07/01/2017 10:15 AM   VAD  Team Pager [phone] (7am - 7am)  Advanced Heart Failure Team Pager [phone] (M-F; 7a - 4p)  Please contact [facility] for night-coverage after hours (4p -7a ) and weekends on [email or web address]

## 2017-07-01 NOTE — Progress Notes (Signed)
Paulina for Coumadin Indication: LVAD, h/o DVT, Factor V Leiden heterozygosity  Allergies  Allergen Reactions  . Metformin And Related Diarrhea    Patient Measurements: Height: 5' 8"  (172.7 cm) Weight: 190 lb 11.2 oz (86.5 kg) IBW/kg (Calculated) : 68.4   Vital Signs: Temp: 97.7 F (36.5 C) (10/27 0404) Temp Source: Oral (10/27 0404) BP: 102/90 (10/27 0404) Pulse Rate: 70 (10/27 0404)  Labs:  Recent Labs  06/29/17 0247  06/29/17 0945 06/29/17 0946 06/29/17 1051 06/30/17 0305 07/01/17 0239  HGB 9.2*  --   --  8.9*  --  9.2* 9.7*  HCT 28.3*  --   --  27.7*  --  28.5* 29.6*  PLT 450*  --   --  446*  --  414* 345  LABPROT 21.3*  --   --   --   --  23.1* 22.5*  INR 1.86  --   --   --   --  2.06 2.00  HEPARINUNFRC 0.40  --   --   --  0.47 0.46  --   CREATININE  --   < > 8.29*  --   --  5.20* 7.07*  < > = values in this interval not displayed.  Estimated Creatinine Clearance: 13.1 mL/min (A) (by C-G formula based on SCr of 7.07 mg/dL (H)).   Medical History: Past Medical History:  Diagnosis Date  . Angina   . ASCVD (arteriosclerotic cardiovascular disease)    , Anterior infarction 2005, LAD diagonal bifurcation intervention 03/2004  . Automatic implantable cardiac defibrillator -St. Jude's       . Benign neoplasm of colon   . CHF (congestive heart failure) ([place])   . Chronic systolic heart failure ([place])   . Coronary artery disease     Widely patent previously placed stents in the left anterior   . Crohn's disease ([place])   . 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 Dyspnea   . Gastroparesis   . GERD (gastroesophageal reflux disease)   . High cholesterol   . Hyperlipidemia   . Hypersomnolent    Previous diagnosis of narcolepsy  . Hypertension, essential   . Ischemic cardiomyopathy    Ejection fraction 15-20% catheterization 2010  . Type II or unspecified type diabetes mellitus without mention of  complication, not stated as uncontrolled   . Unspecified gastritis and gastroduodenitis without mention of hemorrhage     Assessment: 74 yoM on chronic Coumadin for Factor 5 Leiden and hx DVTs admitted for LVAD placement. INR therapeutic today at 2  Goal of Therapy:  INR 2-3  Monitor platelets by anticoagulation protocol: Yes   Plan:  -Coumadin 7.72m PO x1 tonight (to prevent drop to less than 2) -Daily INR, LDH, CBC  Thank you LAnette Guarneri PharmD [phone]/27/2018

## 2017-07-02 LAB — GLUCOSE, CAPILLARY
GLUCOSE-CAPILLARY: 190 mg/dL — AB (ref 65–99)
Glucose-Capillary: 159 mg/dL — ABNORMAL HIGH (ref 65–99)
Glucose-Capillary: 136 mg/dL — ABNORMAL HIGH (ref 65–99)
Glucose-Capillary: 187 mg/dL — ABNORMAL HIGH (ref 65–99)
Glucose-Capillary: 202 mg/dL — ABNORMAL HIGH (ref 65–99)

## 2017-07-02 LAB — CBC
HCT: 31.6 % — ABNORMAL LOW (ref 39.0–52.0)
HEMOGLOBIN: 10.3 g/dL — AB (ref 13.0–17.0)
MCH: 26.4 pg (ref 26.0–34.0)
MCHC: 32.6 g/dL (ref 30.0–36.0)
MCV: 81 fL (ref 78.0–100.0)
PLATELETS: 380 10*3/uL (ref 150–400)
RBC: 3.9 MIL/uL — ABNORMAL LOW (ref 4.22–5.81)
RDW: 18 % — AB (ref 11.5–15.5)
WBC: 11.2 10*3/uL — ABNORMAL HIGH (ref 4.0–10.5)

## 2017-07-02 LAB — RENAL FUNCTION PANEL
ALBUMIN: 2.6 g/dL — AB (ref 3.5–5.0)
ANION GAP: 13 (ref 5–15)
BUN: 25 mg/dL — ABNORMAL HIGH (ref 6–20)
CALCIUM: 8.7 mg/dL — AB (ref 8.9–10.3)
CO2: 23 mmol/L (ref 22–32)
CREATININE: 4.93 mg/dL — AB (ref 0.61–1.24)
Chloride: 96 mmol/L — ABNORMAL LOW (ref 101–111)
GFR calc Af Amer: 14 mL/min — ABNORMAL LOW (ref >60)
GFR calc non Af Amer: 12 mL/min — ABNORMAL LOW (ref >60)
GLUCOSE: 147 mg/dL — AB (ref 65–99)
PHOSPHORUS: 2.9 mg/dL (ref 2.5–4.6)
Potassium: 4.5 mmol/L (ref 3.5–5.1)
SODIUM: 132 mmol/L — AB (ref 135–145)

## 2017-07-02 LAB — PROTIME-INR
INR: 1.99
Prothrombin Time: 22.4 seconds — ABNORMAL HIGH (ref 11.4–15.2)

## 2017-07-02 LAB — LACTATE DEHYDROGENASE: LDH: 255 U/L — ABNORMAL HIGH (ref 98–192)

## 2017-07-02 MED ORDER — WARFARIN SODIUM 10 MG PO TABS
10.0000 mg | ORAL_TABLET | Freq: Once | ORAL | Status: AC
  Administered 2017-07-02: 10 mg via ORAL
  Filled 2017-07-02: qty 1

## 2017-07-02 MED ORDER — HYDRALAZINE HCL 50 MG PO TABS
25.0000 mg | ORAL_TABLET | ORAL | Status: DC
  Administered 2017-07-02 – 2017-07-03 (×4): 25 mg via ORAL
  Filled 2017-07-02 (×4): qty 1

## 2017-07-02 NOTE — Progress Notes (Signed)
Patient ID: [name], male   DOB: Dec 18, 1965, 51 y.o.   MRN: [id number]   Advanced Heart Failure VAD Team Note  Subjective:    Admitted 10/3 for RHC/Swan implantation and optimization prior to LVAD.  HeartMate 3 VAD implanted 10/8.  CVVH begun 10/11 CVVH stopped 10/15 Started iHD 10/17 (now TTS) PermCath and R AV fistula placed 06/26/17 Ramp ECHO 10/24 Speed increased to 5600 Left thoracentesis 10/24  Had HD yesterday without problem. Renal dropped dose of hydralazine. Feels good. No CP or SOB. Remains anuric.   Plan for HD M/W/F per patient request at [facility])  LVAD INTERROGATION:  HeartMate 3:  Flow 4.6 liters/min, speed 5600 rpm, power 4.0, PI 3.1. No PI events. Personally reviewed    Objective:    Vital Signs:   Temp:  [97.4 F (36.3 C)-98.9 F (37.2 C)] 98.9 F (37.2 C) (10/28 0312) Pulse Rate:  [74-99] 94 (10/27 1953) Resp:  [18-27] 18 (10/28 0312) BP: (96-159)/(68-95) 96/69 (10/28 0312) SpO2:  [93 %-99 %] 97 % (10/28 0312) Weight:  [83 kg (182 lb 15.7 oz)-83.8 kg (184 lb 11.9 oz)] 83.8 kg (184 lb 11.9 oz) (10/28 0535) Last BM Date: 06/30/17 Mean arterial Pressure 70-90 Personally reviewed   Intake/Output:   Intake/Output Summary (Last 24 hours) at 07/02/17 0807 Last data filed at 07/02/17 0600  Gross per 24 hour  Intake              720 ml  Output             3500 ml  Net            -2780 ml     Physical Exam: General:  NAD. Sitting in chair  HEENT: normal  Neck: supple. JVP not elevated.  Carotids 2+ bilat; no bruits. No lymphadenopathy or thryomegaly appreciated. Cor: Normal HM-3 sounds   + permcath  Lungs: Clear. Abdomen: obese soft, nontender, non-distended. No hepatosplenomegaly. No bruits or masses. Good bowel sounds. Driveline site clean. Anchor in place.  Extremities: no cyanosis, clubbing, rash. Warm no edema  Neuro: alert & oriented x 3. No focal deficits. Moves all 4 without problem        Telemetry   NSR 90s.  Personally reviewed    Labs   Basic Metabolic Panel:  Recent Labs Lab 06/26/17 0305 06/27/17 4818  06/28/17 0505 06/29/17 0745 06/29/17 0945 06/30/17 0305 07/01/17 0239 07/02/17 0158  NA 129*  130* 130*  --  131* 131* 132* 132* 131* 132*  K 3.7  3.7 4.5  --  3.6 3.8 3.7 3.3* 3.7 4.5  CL 96*  97* 96*  --  94* 95* 96* 93* 95* 96*  CO2 24  24 20*  --  24 21* 23 27 25 23   GLUCOSE 149*  150* 156*  --  158* 121* 117* 125* 147* 147*  BUN 38*  39* 49*  --  28* 47* 48* 29* 51* 25*  CREATININE 7.52*  7.50* 9.04*  --  6.13* 8.07* 8.29* 5.20* 7.07* 4.93*  CALCIUM 8.4*  8.4* 8.6*  < > 8.6* 8.3* 8.4* 8.4* 8.6* 8.7*  MG 2.1  --   --   --   --   --   --   --   --   PHOS 3.6 4.7*  --  4.2  --  5.0*  --  4.1 2.9  < > = values in this interval not displayed. Liver Function Tests:  Recent Labs Lab 06/27/17 0418 06/28/17  0505 06/29/17 0945 07/01/17 0239 07/02/17 0158  ALBUMIN 2.4* 2.3* 2.3* 2.3* 2.6*   No results for input(s): LIPASE, AMYLASE in the last 168 hours. No results for input(s): AMMONIA in the last 168 hours.  CBC:  Recent Labs Lab 06/29/17 0247 06/29/17 0946 06/30/17 0305 07/01/17 0239 07/02/17 0158  WBC 13.4* 14.5* 12.5* 9.3 11.2*  HGB 9.2* 8.9* 9.2* 9.7* 10.3*  HCT 28.3* 27.7* 28.5* 29.6* 31.6*  MCV 80.9 80.3 80.3 80.4 81.0  PLT 450* 446* 414* 345 380   INR:  Recent Labs Lab 06/28/17 0505 06/29/17 0247 06/30/17 0305 07/01/17 0239 07/02/17 0158  INR 1.48 1.86 2.06 2.00 1.99   Other results:  Imaging   No results found.  Medications:     Scheduled Medications: . amiodarone  200 mg Oral Daily  . aspirin EC  325 mg Oral Daily  . atorvastatin  80 mg Oral q1800  . bisacodyl  10 mg Oral Daily  . calcitRIOL  0.5 mcg Oral Q T,Th,Sa-HD  . darbepoetin (ARANESP) injection - DIALYSIS  150 mcg Intravenous Q Sat-HD  . docusate sodium  200 mg Oral Daily  . feeding supplement (GLUCERNA SHAKE)  237 mL Oral TID BM  . feeding supplement (PRO-STAT  SUGAR FREE 64)  30 mL Oral TID WC  . hydrALAZINE  25 mg Oral 3 times per day on Sun Mon Wed Fri  . insulin aspart  0-24 Units Subcutaneous Q4H  . multivitamin  1 tablet Oral QHS  . pantoprazole  40 mg Oral Daily  . sildenafil  20 mg Oral TID  . sodium chloride flush  3 mL Intravenous Q12H  . warfarin  10 mg Oral ONCE-1800  . Warfarin - Pharmacist Dosing Inpatient   Does not apply q1800    Infusions: . ferric gluconate (FERRLECIT/NULECIT) IV Stopped (07/01/17 1227)    PRN Medications: hydrALAZINE, LORazepam, ondansetron (ZOFRAN) IV, oxyCODONE, promethazine, sodium chloride flush, traMADol  Patient Profile   51 yo with history of CAD s/p anterior MI in 2010 and chronic systolic CHF (ischemic cardiomyopathy) as well as CKD stage III now s/p HeartMate 3 VAD placement.   Assessment/Plan:    1. Acute on chronic systolic CHF: Ischemic cardiomyopathy. St Jude ICD. NSTEMI in March 2018 with DES to LAD and RCA, complicated by cardiogenic shock, low output requiring milrinone. Most recent echo 8/18 with EF 20-25%, moderate MR. He was admitted and started on milrinone with improved creatinine.  HeartMate 3 VAD placement on 10/8. Speed cut back to 5400 with onset of afib/RVR 10/11.  Milrinone stopped 10/19. No central line to check co-ox. Will continue sildenafil for RV support.   - INR 1.99 this am. Off heparin. INR Goal 2-3 (aim slightly higher with h/o Factor V Leiden heterozygosity) and ASA 325. - Will not lower aspirin dose with Factor V Leiden.  - MAPs 70-90. Including during dialysis yesterday.  - Renal dropped hydralazine from 50 to 25 tid to support HD and possible renal recovery - VAD parameters stable  2. AKI on CKD: No renal recovery, tolerating IHD.   - Permcath and AV fistula placed 06/26/17.  - Plan for outpatient HD M[facility]F at [facility]. Likely home Monday after HD.  - No signs of renal recovery yep - HD in am prior to d/c 3. CAD: NSTEMI in 3/18. LHC with 99% ulcerated  lesion proximal RCA with left to right collaterals, 95% mid LAD stenosis after mid LAD stent. s/p PCI to RCA and LAD on 11/25/16.  - No s/s  of ischemia.    - Off plavix for LVAD.   - Continue statin.   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 Recent V/Q scan did not show acute or chronic PE.  He is a heterozygote for Factor V Leiden, plan for ASA 325 and warfarin INR 2-3 with LVAD. - INR 1.99  Off heparin.  5. Chronic N/V/D with gastroparesis: Resolved.  6. DM: On Jardiance at home, will cover with sliding scale. Glucose stable.   7. S/P Multiple Tooth Extractions 4-15, 19-23, and 27-29.  - Stable.  8. Atrial fibrillation:  - Maintaining NSR on amio. Decrease to 224m daily yesterday 9. ID: CXR with LLL atelectasis versus infiltrate. Had course of abx for HCAP.   - He is off vancomycin, zosyn stopped 10/20. Afebrile.  10. Hyponatremia:  - 132 today. Free water restrict.  11. Anemia:  - Hgb 10.3 today 12. Left Pleural Effusion: S/P Thoracentesis 10/24 with 1 liter removed.  - Stable.  13. Hypokalemia - K 4.5 today  I reviewed the LVAD parameters from today, and compared the results to the patient's prior recorded data.  No programming changes were made.  The LVAD is functioning within specified parameters.  The patient performs LVAD self-test daily.  LVAD interrogation was negative for any significant power changes, alarms or PI events/speed drops.  LVAD equipment check completed and is in good working order.  Back-up equipment present.   LVAD education done on emergency procedures and precautions and reviewed exit site care.   Disposition: Home tomorrow am after HD. HHPT/HHRN ordered. Will need CM to check on cost of revatio. Consult placed.   BGlori Bickers MD  07/02/2017 8:07 AM   VAD Team Pager [phone](7[place]  Advanced Heart Failure Team Pager [phone](M-F; 7[place]  [facility] contact [facility] for night-coverage after hours (4p -7a ) and weekends on  [email or web address]

## 2017-07-02 NOTE — Progress Notes (Signed)
Subjective: Interval History: has no complaint .  Objective: Vital signs in last 24 hours: Temp:  [97.4 F (36.3 C)-98.9 F (37.2 C)] 98.9 F (37.2 C) (10/28 0312) Pulse Rate:  [59-99] 94 (10/27 1953) Resp:  [16-27] 18 (10/28 0312) BP: (96-159)/(68-95) 96/69 (10/28 0312) SpO2:  [93 %-99 %] 97 % (10/28 0312) Weight:  [83 kg (182 lb 15.7 oz)-86.6 kg (190 lb 14.7 oz)] 83.8 kg (184 lb 11.9 oz) (10/28 0535) Weight change: 0.1 kg (3.5 oz)  Intake/Output from previous day: 10/27 0701 - 10/28 0700 In: 720 [P.O.:720] Out: 3500  Intake/Output this shift: No intake/output data recorded.  General appearance: alert, cooperative, no distress and mildly obese Resp: rales LLL Chest wall: RIJ PC Cardio: systolic murmur: systolic ejection 2/6, decrescendo at 2nd left intercostal space and cont hum LVAD GI: at 2nd left intercostal space Extremities: edema 1+, AVF RUA,  Pacer L Gandy  Lab Results:  Recent Labs  07/01/17 0239 07/02/17 0158  WBC 9.3 11.2*  HGB 9.7* 10.3*  HCT 29.6* 31.6*  PLT 345 380   BMET:  Recent Labs  07/01/17 0239 07/02/17 0158  NA 131* 132*  K 3.7 4.5  CL 95* 96*  CO2 25 23  GLUCOSE 147* 147*  BUN 51* 25*  CREATININE 7.07* 4.93*  CALCIUM 8.6* 8.7*   No results for input(s): PTH in the last 72 hours. Iron Studies: No results for input(s): IRON, TIBC, TRANSFERRIN, FERRITIN in the last 72 hours.  Studies/Results: No results found.  I have reviewed the patient's current medications.  Assessment/Plan: 1 ESRD still some  Hope of some recovery , none at this time. bp low at end of HD, will lower Hydral. Do HD in am in anticipation of D/C 2 CM per Cards 3 Anemia improving esa/Fe 4 HPTH vit D 5 DM controlled 6 CAD 7 Hx DVT P HD, esa, plan D/C    LOS: 25 days   [name] L 07/02/2017,7:43 AM

## 2017-07-02 NOTE — Progress Notes (Addendum)
[name] for Coumadin Indication: LVAD, h/o DVT, Factor V Leiden heterozygosity  Allergies  Allergen Reactions  . Metformin And Related Diarrhea    Patient Measurements: Height: 5' 8"  (172.7 cm) Weight: 184 lb 11.9 oz (83.8 kg) IBW/kg (Calculated) : 68.4   Vital Signs: Temp: 98.9 F (37.2 C) (10/28 0312) Temp Source: Oral (10/28 0312) BP: 96/69 (10/28 0312) Pulse Rate: 94 (10/27 1953)  Labs:  Recent Labs  06/29/17 1051 06/30/17 0305 07/01/17 0239 07/02/17 0158  HGB  --  9.2* 9.7* 10.3*  HCT  --  28.5* 29.6* 31.6*  PLT  --  414* 345 380  LABPROT  --  23.1* 22.5* 22.4*  INR  --  2.06 2.00 1.99  HEPARINUNFRC 0.47 0.46  --   --   CREATININE  --  5.20* 7.07* 4.93*    Estimated Creatinine Clearance: 18.5 mL/min (A) (by C-G formula based on SCr of 4.93 mg/dL (H)).  Assessment: 78 yoM on chronic Coumadin for Factor 5 Leiden and hx DVTs admitted for LVAD placement.   INR down slightly overnight to 1.99 this am. Higher dose of 7.8m given last night. He has been trending at low end of goal, amiodarone also decreased to 2092mdaily will give boosted dose tonight with anticipation of d/c in am. Will likely be able to return closer to home dose of warfarin at discharge.  Home dose pta: 7.37m22m/T/Sat, 37mg44mW/F/Sun  Goal of Therapy:  INR 2-3  Monitor platelets by anticoagulation protocol: Yes   Plan:  -Coumadin 10mg537might -Daily INR, LDH, CBC  Thank you, FrankErin HearingmD., BCPS Clinical Pharmacist Pager [phone]/2018 7:44 AM

## 2017-07-03 ENCOUNTER — Inpatient Hospital Stay (HOSPITAL_COMMUNITY): Payer: BLUE CROSS/BLUE SHIELD

## 2017-07-03 ENCOUNTER — Ambulatory Visit (INDEPENDENT_AMBULATORY_CARE_PROVIDER_SITE_OTHER): Payer: BLUE CROSS/BLUE SHIELD | Admitting: *Deleted

## 2017-07-03 ENCOUNTER — Encounter (HOSPITAL_COMMUNITY): Payer: BLUE CROSS/BLUE SHIELD

## 2017-07-03 ENCOUNTER — Telehealth: Payer: Self-pay | Admitting: Cardiology

## 2017-07-03 DIAGNOSIS — I255 Ischemic cardiomyopathy: Secondary | ICD-10-CM | POA: Diagnosis not present

## 2017-07-03 LAB — RENAL FUNCTION PANEL
ALBUMIN: 2.5 g/dL — AB (ref 3.5–5.0)
ANION GAP: 10 (ref 5–15)
BUN: 49 mg/dL — ABNORMAL HIGH (ref 6–20)
CALCIUM: 8.6 mg/dL — AB (ref 8.9–10.3)
CO2: 23 mmol/L (ref 22–32)
CREATININE: 6.95 mg/dL — AB (ref 0.61–1.24)
Chloride: 98 mmol/L — ABNORMAL LOW (ref 101–111)
GFR, EST AFRICAN AMERICAN: 9 mL/min — AB (ref >60)
GFR, EST NON AFRICAN AMERICAN: 8 mL/min — AB (ref >60)
GLUCOSE: 202 mg/dL — AB (ref 65–99)
PHOSPHORUS: 3.6 mg/dL (ref 2.5–4.6)
Potassium: 4.7 mmol/L (ref 3.5–5.1)
Sodium: 131 mmol/L — ABNORMAL LOW (ref 135–145)

## 2017-07-03 LAB — CBC
HCT: 30.4 % — ABNORMAL LOW (ref 39.0–52.0)
Hemoglobin: 9.8 g/dL — ABNORMAL LOW (ref 13.0–17.0)
MCH: 26.3 pg (ref 26.0–34.0)
MCHC: 32.2 g/dL (ref 30.0–36.0)
MCV: 81.7 fL (ref 78.0–100.0)
PLATELETS: 340 10*3/uL (ref 150–400)
RBC: 3.72 MIL/uL — ABNORMAL LOW (ref 4.22–5.81)
RDW: 18 % — AB (ref 11.5–15.5)
WBC: 10 10*3/uL (ref 4.0–10.5)

## 2017-07-03 LAB — GLUCOSE, CAPILLARY
Glucose-Capillary: 178 mg/dL — ABNORMAL HIGH (ref 65–99)
Glucose-Capillary: 98 mg/dL (ref 65–99)

## 2017-07-03 LAB — PROTIME-INR
INR: 2.55
PROTHROMBIN TIME: 27.3 s — AB (ref 11.4–15.2)

## 2017-07-03 LAB — BRAIN NATRIURETIC PEPTIDE: B NATRIURETIC PEPTIDE 5: 758.9 pg/mL — AB (ref 0.0–100.0)

## 2017-07-03 LAB — LACTATE DEHYDROGENASE: LDH: 260 U/L — AB (ref 98–192)

## 2017-07-03 MED ORDER — HYDRALAZINE HCL 25 MG PO TABS: ORAL_TABLET | ORAL | 6 refills | Status: DC

## 2017-07-03 MED ORDER — HEPARIN SODIUM (PORCINE) 1000 UNIT/ML DIALYSIS: 1000.0000 [IU] | INTRAMUSCULAR | Status: DC | PRN

## 2017-07-03 MED ORDER — DOCUSATE SODIUM 100 MG PO CAPS: 200.0000 mg | ORAL_CAPSULE | Freq: Every day | ORAL | 0 refills | Status: DC

## 2017-07-03 MED ORDER — WARFARIN SODIUM 5 MG PO TABS: ORAL_TABLET | ORAL | 6 refills | Status: DC

## 2017-07-03 MED ORDER — LIDOCAINE-PRILOCAINE 2.5-2.5 % EX CREA: 1.0000 "application " | TOPICAL_CREAM | CUTANEOUS | Status: DC | PRN

## 2017-07-03 MED ORDER — ASPIRIN 325 MG PO TBEC: 325.0000 mg | DELAYED_RELEASE_TABLET | Freq: Every day | ORAL | 11 refills | Status: DC

## 2017-07-03 MED ORDER — HEPARIN SODIUM (PORCINE) 1000 UNIT/ML DIALYSIS
100.0000 [IU]/kg | INTRAMUSCULAR | Status: DC | PRN
  Filled 2017-07-03: qty 9

## 2017-07-03 MED ORDER — SODIUM CHLORIDE 0.9 % IV SOLN: 100.0000 mL | INTRAVENOUS | Status: DC | PRN

## 2017-07-03 MED ORDER — AMIODARONE HCL 200 MG PO TABS: 200.0000 mg | ORAL_TABLET | Freq: Every day | ORAL | 5 refills | Status: DC

## 2017-07-03 MED ORDER — INSULIN GLARGINE 100 UNIT/ML ~~LOC~~ SOLN: 5.0000 [IU] | Freq: Every day | SUBCUTANEOUS | 11 refills | Status: DC

## 2017-07-03 MED ORDER — PENTAFLUOROPROP-TETRAFLUOROETH EX AERO: 1.0000 "application " | INHALATION_SPRAY | CUTANEOUS | Status: DC | PRN

## 2017-07-03 MED ORDER — INSULIN ASPART 100 UNIT/ML ~~LOC~~ SOLN: 3.0000 [IU] | Freq: Three times a day (TID) | SUBCUTANEOUS | 0 refills | Status: DC

## 2017-07-03 MED ORDER — WARFARIN SODIUM 5 MG PO TABS
5.0000 mg | ORAL_TABLET | Freq: Once | ORAL | Status: AC
  Administered 2017-07-03: 5 mg via ORAL
  Filled 2017-07-03: qty 1

## 2017-07-03 MED ORDER — LIDOCAINE HCL (PF) 1 % IJ SOLN: 5.0000 mL | INTRAMUSCULAR | Status: DC | PRN

## 2017-07-03 MED ORDER — SILDENAFIL CITRATE 20 MG PO TABS: 20.0000 mg | ORAL_TABLET | Freq: Three times a day (TID) | ORAL | 6 refills | Status: DC

## 2017-07-03 MED ORDER — ALTEPLASE 2 MG IJ SOLR: 2.0000 mg | Freq: Once | INTRAMUSCULAR | Status: DC | PRN

## 2017-07-03 NOTE — Progress Notes (Signed)
Inpatient Diabetes Program Recommendations  AACE/ADA: New Consensus Statement on Inpatient Glycemic Control (2015)  Target Ranges:  Prepandial:   less than 140 mg/dL      Peak postprandial:   less than 180 mg/dL (1-2 hours)      Critically ill patients:  140 - 180 mg/dL   Lab Results  Component Value Date   GLUCAP 178 (H) 07/03/2017   HGBA1C 10.1 (H) 06/07/2017    Review of Glycemic ControlResults for [name], [name] (MRN [id number]) as of 07/03/2017 10:31  Ref. Range 07/02/2017 08:12 07/02/2017 12:42 07/02/2017 19:24 07/02/2017 23:10 07/03/2017 03:33  Glucose-Capillary Latest Ref Range: 65 - 99 mg/dL 136 (H) 190 (H) 187 (H) 202 (H) 178 (H)   Diabetes history: Type 2 diabetes Outpatient Diabetes medications: Jardiance 25 mg daily, Novolog 7 units with supper, Lantus 21 units every evening Current orders for Inpatient glycemic control:  TCTS correction q 4 hours Inpatient Diabetes Program Recommendations:   Please consider changing Novolog correction to moderate tid with meals and HS. Also consider adding Lantus 10 units daily.   Thanks, Adah Perl, RN, BC-ADM Inpatient Diabetes Coordinator Pager [phone] (8a-5p)

## 2017-07-03 NOTE — Progress Notes (Signed)
Patient ID: [name], male   DOB: 06-26-1966, 51 y.o.   MRN: [id number]   Advanced Heart Failure VAD Team Note  Subjective:    Admitted 10/3 for RHC/Swan implantation and optimization prior to LVAD.  HeartMate 3 VAD implanted 10/8.  CVVH begun 10/11 CVVH stopped 10/15 Started iHD 10/17 (now TTS) PermCath and R AV fistula placed 06/26/17 Ramp ECHO 10/24 Speed increased to 5600 Left thoracentesis 10/24  Yesterday amio cut back to 200 mg daily.   Feels great. Wants to go home.    LVAD INTERROGATION:  HeartMate 3:  Flow 4.6  liters/min, speed 5600 rpm, power 4 , PI 3.1 . No PI events. Personally reviewed    Objective:    Vital Signs:   Temp:  [97.6 F (36.4 C)-98.2 F (36.8 C)] 97.6 F (36.4 C) (10/29 0658) Pulse Rate:  [78-90] 88 (10/29 0930) Resp:  [18-26] 18 (10/29 0930) BP: (96-119)/(59-90) 107/59 (10/29 0930) SpO2:  [98 %-100 %] 98 % (10/29 0653) Weight:  [185 lb 10 oz (84.2 kg)-186 lb 8.2 oz (84.6 kg)] 186 lb 8.2 oz (84.6 kg) (10/29 0658) Last BM Date: 07/02/17 Mean arterial Pressure 70-90 Personally reviewed   Intake/Output:   Intake/Output Summary (Last 24 hours) at 07/03/17 1010 Last data filed at 07/02/17 1900  Gross per 24 hour  Intake              480 ml  Output                0 ml  Net              480 ml      Physical Exam: GENERAL: Well appearing, in dialysis.  HEENT: normal  NECK: Supple, JVP 7-8  .  2+ bilaterally, no bruits.  No lymphadenopathy or thyromegaly appreciated.   CARDIAC:  Mechanical heart sounds with LVAD hum present.  LUNGS:  Clear to auscultation bilaterally.  ABDOMEN:  Soft, round, nontender, positive bowel sounds x4.     LVAD exit site: well-healed and incorporated.  Dressing dry and intact.  No erythema or drainage.  Stabilization device present and accurately applied.  Driveline dressing is being changed daily per sterile technique. EXTREMITIES:  Warm and dry, no cyanosis, clubbing, rash or edema  NEUROLOGIC:  Alert  and oriented x 4.  Gait steady.  No aphasia.  No dysarthria.  Affect pleasant.       Telemetry   NSR 90s personally reviewed.     Labs   Basic Metabolic Panel:  Recent Labs Lab 06/28/17 0505  06/29/17 0945 06/30/17 0305 07/01/17 0239 07/02/17 0158 07/03/17 0037  NA 131*  < > 132* 132* 131* 132* 131*  K 3.6  < > 3.7 3.3* 3.7 4.5 4.7  CL 94*  < > 96* 93* 95* 96* 98*  CO2 24  < > 23 27 25 23 23   GLUCOSE 158*  < > 117* 125* 147* 147* 202*  BUN 28*  < > 48* 29* 51* 25* 49*  CREATININE 6.13*  < > 8.29* 5.20* 7.07* 4.93* 6.95*  CALCIUM 8.6*  < > 8.4* 8.4* 8.6* 8.7* 8.6*  PHOS 4.2  --  5.0*  --  4.1 2.9 3.6  < > = values in this interval not displayed. Liver Function Tests:  Recent Labs Lab 06/28/17 0505 06/29/17 0945 07/01/17 0239 07/02/17 0158 07/03/17 0037  ALBUMIN 2.3* 2.3* 2.3* 2.6* 2.5*   No results for input(s): LIPASE, AMYLASE in the last 168 hours. No results for  input(s): AMMONIA in the last 168 hours.  CBC:  Recent Labs Lab 06/29/17 0946 06/30/17 0305 07/01/17 0239 07/02/17 0158 07/03/17 0037  WBC 14.5* 12.5* 9.3 11.2* 10.0  HGB 8.9* 9.2* 9.7* 10.3* 9.8*  HCT 27.7* 28.5* 29.6* 31.6* 30.4*  MCV 80.3 80.3 80.4 81.0 81.7  PLT 446* 414* 345 380 340   INR:  Recent Labs Lab 06/29/17 0247 06/30/17 0305 07/01/17 0239 07/02/17 0158 07/03/17 0037  INR 1.86 2.06 2.00 1.99 2.55   Other results:  Imaging   Dg Chest 2 View  Result Date: 07/03/2017 CLINICAL DATA:  Left ventricular assist device placed 2 weeks ago. No current chest complaints. History of ischemic cardiomyopathy, diabetes, hypertension. EXAM: CHEST  2 VIEW COMPARISON:  Portable chest x-ray of June 29, 2017 FINDINGS: The lungs are less well inflated today. There is persistent increased density at the left lung base with obscuration of the hemidiaphragm. The cardiac silhouette is enlarged. The left ventricular assist device as well as the ICD are in stable position. The dual-lumen  dialysis catheter tip projects at the cavoatrial junction. IMPRESSION: Increased hypoinflation today. Persistent left basilar atelectasis or infiltrate with small left pleural effusion. Stable cardiomegaly without pulmonary vascular congestion. The support devices are in stable position. Electronically Signed   By: David  Martinique M.D.   On: 07/03/2017 07:30    Medications:     Scheduled Medications: . amiodarone  200 mg Oral Daily  . aspirin EC  325 mg Oral Daily  . atorvastatin  80 mg Oral q1800  . bisacodyl  10 mg Oral Daily  . calcitRIOL  0.5 mcg Oral Q T,Th,Sa-HD  . darbepoetin (ARANESP) injection - DIALYSIS  150 mcg Intravenous Q Sat-HD  . docusate sodium  200 mg Oral Daily  . feeding supplement (GLUCERNA SHAKE)  237 mL Oral TID BM  . feeding supplement (PRO-STAT SUGAR FREE 64)  30 mL Oral TID WC  . hydrALAZINE  25 mg Oral 3 times per day on Sun Mon Wed Fri  . insulin aspart  0-24 Units Subcutaneous Q4H  . multivitamin  1 tablet Oral QHS  . pantoprazole  40 mg Oral Daily  . sildenafil  20 mg Oral TID  . sodium chloride flush  3 mL Intravenous Q12H  . Warfarin - Pharmacist Dosing Inpatient   Does not apply q1800    Infusions: . sodium chloride    . sodium chloride    . ferric gluconate (FERRLECIT/NULECIT) IV Stopped (07/01/17 1227)    PRN Medications: sodium chloride, sodium chloride, alteplase, heparin, heparin, hydrALAZINE, lidocaine (PF), lidocaine-prilocaine, LORazepam, ondansetron (ZOFRAN) IV, oxyCODONE, pentafluoroprop-tetrafluoroeth, promethazine, sodium chloride flush, traMADol  Patient Profile   51 yo with history of CAD s/p anterior MI in 2010 and chronic systolic CHF (ischemic cardiomyopathy) as well as CKD stage III now s/p HeartMate 3 VAD placement.   Assessment/Plan:    1. Acute on chronic systolic CHF: Ischemic cardiomyopathy. St Jude ICD. NSTEMI in March 2018 with DES to LAD and RCA, complicated by cardiogenic shock, low output requiring milrinone. Most  recent echo 8/18 with EF 20-25%, moderate MR. He was admitted and started on milrinone with improved creatinine.  HeartMate 3 VAD placement on 10/8. Speed cut back to 5400 with onset of afib/RVR 10/11.  Milrinone stopped 10/19. No central line to check co-ox. Will continue sildenafil for RV support.   - INR 1.99 this am. Off heparin. INR Goal 2-3 (aim slightly higher with h/o Factor V Leiden heterozygosity) and ASA 325. - Will not lower  aspirin dose with Factor V Leiden.  - Maps stable 80-90s.  - Continue current dose of hydralazine. - VAD parameters stable  2. AKI on CKD: No renal recovery, tolerating IHD.   - Permcath and AV fistula placed 06/26/17.  -HD set up at [facility]. Mon-Wed-Fri. He will be at HD [facility] at 11:50 on Wednesday.   - No signs of renal recovery yep Had HD today.  3. CAD: NSTEMI in 3/18. LHC with 99% ulcerated lesion proximal RCA with left to right collaterals, 95% mid LAD stenosis after mid LAD stent. s/p PCI to RCA and LAD on 11/25/16.  - No s/s of ischemia.    - Off plavix for LVAD.   - Continue statin.   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 Recent V/Q scan did not show acute or chronic PE.  He is a heterozygote for Factor V Leiden, plan for ASA 325 and warfarin INR 2-3 with LVAD. - INR 2.55. On coumadin.   5. Chronic N/V/D with gastroparesis: Resolved.  6. DM: On Jardiance at home, will cover with sliding scale. Glucose stable.   7. S/P Multiple Tooth Extractions 4-15, 19-23, and 27-29.  - Stable.  8. Atrial fibrillation:  - Maintaining NSR on amio. Continue amio 200 mg daily.  9. ID: CXR with LLL atelectasis versus infiltrate. Had course of abx for HCAP.   - He is off vancomycin, zosyn stopped 10/20. Afebrile.  10. Hyponatremia:  - Sodium 131. Free water restrict.  11. Anemia:  - Hgb 9.8  12. Left Pleural Effusion: S/P Thoracentesis 10/24 with 1 liter removed.  - Stable.  13. Hypokalemia - K 4.7 Stable.   I reviewed the LVAD parameters  from today, and compared the results to the patient's prior recorded data.  No programming changes were made.  The LVAD is functioning within specified parameters.  The patient performs LVAD self-test daily.  LVAD interrogation was negative for any significant power changes, alarms or PI events/speed drops.  LVAD equipment check completed and is in good working order.  Back-up equipment present.   LVAD education done on emergency procedures and precautions and reviewed exit site care.   [facility] to follow for HHRN/HHPT/HHOT.  VAD clinic on November 1st. Check INR at that time.    Darrick Grinder, NP  07/03/2017 10:10 AM   VAD Team Pager [phone] (7am - 7am)  Advanced Heart Failure Team Pager [phone] (M-F; 7a - 4p)  Please contact [facility] for night-coverage after hours (4p -7a ) and weekends on [email or web address]  Patient seen with NP, agree with the above note.  He is doing well, tolerated HD again today without problems.  INR therapeutic.  I think that he can go home today.  He is set up for MWF HD.   MAP 80s-90s, will continue current hydralazine.    He will have close outpatient followup.   Briant Angelillo 07/03/2017 11:39 AM

## 2017-07-03 NOTE — Progress Notes (Signed)
ANTICOAGULATION CONSULT NOTE  Pharmacy Consult for Coumadin Indication: LVAD, h/o DVT, Factor V Leiden heterozygosity  Patient Measurements: Height: 5' 8"  (172.7 cm) Weight: 186 lb 8.2 oz (84.6 kg) IBW/kg (Calculated) : 68.4  Vital Signs: Temp: 98 F (36.7 C) (10/29 1113) Temp Source: Oral (10/29 1113) BP: 118/82 (10/29 1113) Pulse Rate: 94 (10/29 1113)  Labs:  Recent Labs  07/01/17 0239 07/02/17 0158 07/03/17 0037  HGB 9.7* 10.3* 9.8*  HCT 29.6* 31.6* 30.4*  PLT 345 380 340  LABPROT 22.5* 22.4* 27.3*  INR 2.00 1.99 2.55  CREATININE 7.07* 4.93* 6.95*    Estimated Creatinine Clearance: 13.2 mL/min (A) (by C-G formula based on SCr of 6.95 mg/dL (H)).  Assessment: [name] is a 44 yoM on chronic Coumadin for Factor 5 Leiden and hx DVTs admitted for LVAD placement. His LVAD was placed on 06/12/17. CBC is stable, no bleeding noted. Going for HD today and plan to discharge home.  His INR is now therapeutic at 2.55 following the 10 mg dose of warfarin last night. We will plan for him to discharge on 7.5 mg MWF, then 5 mg on all other days. Today he will get a lower dose (5 mg) since he received 10 mg yesterday and will likely continue an upward trend.  Goal of Therapy:  INR 2-3 (higher goal than normal LVAD due to Factor 5 Leiden history) Monitor platelets by anticoagulation protocol: Yes   Plan:  -Coumadin 5 mg now -Discharge on 7.5 mg MWF, 5 mg all other days -INR at his next HF clinic appt.   Patterson Hammersmith PharmD PGY1 Pharmacy Practice Resident 07/03/2017 11:36 AM Pager: [phone]

## 2017-07-03 NOTE — Progress Notes (Signed)
[facility] Progress Note    Assessment/ Plan:   1 ESRD-  ? Potential for recovery.  Tolerating dialysis well. 2 Cardiomyopathy: s/p LVAD per cards, on amio 3 Anemia- improving, ferrlicit 623 mg q treatment begun 10/25, Aranesp 150 mcg q Saturday (10/27). 4 HPTH- on calcitriol 0.5 mcg q treatment 5 DM controlled 6 CAD 7 Hx DVT 8.  Dispo: will confirm CLIP today    Subjective:   Tolerating dialysis well.  For home after HD today.       Objective:   BP 118/90   Pulse 90   Temp 97.6 F (36.4 C) (Oral)   Resp 20   Ht 5' 8"  (1.727 m)   Wt 84.2 kg (185 lb 10 oz)   SpO2 98%   BMI 28.22 kg/m   Intake/Output Summary (Last 24 hours) at 07/03/17 0912 Last data filed at 07/02/17 1900  Gross per 24 hour  Intake              480 ml  Output                0 ml  Net              480 ml   Weight change: -2.4 kg (-5 lb 4.7 oz)  Physical Exam: General appearance: alert, cooperative, no distress  Resp: normal WOB, L > R bibasilar crackles Chest wall: RIJ PC Cardio:+ LVAD hum heard through precordium ABD: + BS Extremities: no LE edema, AVF RUA,  Pacer L [place]  Imaging: Dg Chest 2 View  Result Date: 07/03/2017 CLINICAL DATA:  Left ventricular assist device placed 2 weeks ago. No current chest complaints. History of ischemic cardiomyopathy, diabetes, hypertension. EXAM: CHEST  2 VIEW COMPARISON:  Portable chest x-ray of June 29, 2017 FINDINGS: The lungs are less well inflated today. There is persistent increased density at the left lung base with obscuration of the hemidiaphragm. The cardiac silhouette is enlarged. The left ventricular assist device as well as the ICD are in stable position. The dual-lumen dialysis catheter tip projects at the cavoatrial junction. IMPRESSION: Increased hypoinflation today. Persistent left basilar atelectasis or infiltrate with small left pleural effusion. Stable cardiomegaly without pulmonary vascular congestion. The support devices are in  stable position. Electronically Signed   By: David  Martinique M.D.   On: 07/03/2017 07:30    Labs: BMET  Recent Labs Lab 06/27/17 7628  06/28/17 0505 06/29/17 0745 06/29/17 0945 06/30/17 0305 07/01/17 0239 07/02/17 0158 07/03/17 0037  NA 130*  --  131* 131* 132* 132* 131* 132* 131*  K 4.5  --  3.6 3.8 3.7 3.3* 3.7 4.5 4.7  CL 96*  --  94* 95* 96* 93* 95* 96* 98*  CO2 20*  --  24 21* 23 27 25 23 23   GLUCOSE 156*  --  158* 121* 117* 125* 147* 147* 202*  BUN 49*  --  28* 47* 48* 29* 51* 25* 49*  CREATININE 9.04*  --  6.13* 8.07* 8.29* 5.20* 7.07* 4.93* 6.95*  CALCIUM 8.6*  < > 8.6* 8.3* 8.4* 8.4* 8.6* 8.7* 8.6*  PHOS 4.7*  --  4.2  --  5.0*  --  4.1 2.9 3.6  < > = values in this interval not displayed. CBC  Recent Labs Lab 06/30/17 0305 07/01/17 0239 07/02/17 0158 07/03/17 0037  WBC 12.5* 9.3 11.2* 10.0  HGB 9.2* 9.7* 10.3* 9.8*  HCT 28.5* 29.6* 31.6* 30.4*  MCV 80.3 80.4 81.0 81.7  PLT 414* 345  380 340    Medications:    . amiodarone  200 mg Oral Daily  . aspirin EC  325 mg Oral Daily  . atorvastatin  80 mg Oral q1800  . bisacodyl  10 mg Oral Daily  . calcitRIOL  0.5 mcg Oral Q T,Th,Sa-HD  . darbepoetin (ARANESP) injection - DIALYSIS  150 mcg Intravenous Q Sat-HD  . docusate sodium  200 mg Oral Daily  . feeding supplement (GLUCERNA SHAKE)  237 mL Oral TID BM  . feeding supplement (PRO-STAT SUGAR FREE 64)  30 mL Oral TID WC  . hydrALAZINE  25 mg Oral 3 times per day on Sun Mon Wed Fri  . insulin aspart  0-24 Units Subcutaneous Q4H  . multivitamin  1 tablet Oral QHS  . pantoprazole  40 mg Oral Daily  . sildenafil  20 mg Oral TID  . sodium chloride flush  3 mL Intravenous Q12H  . Warfarin - Pharmacist Dosing Inpatient   Does not apply q1800      Madelon Lips MD [facility] pgr [phone] 07/03/2017, 9:12 AM

## 2017-07-03 NOTE — Progress Notes (Signed)
VAD Discharge Teaching Note:  Discharge VAD teaching completed with [name] and [name], his daughter.  The home inspection checklist has been reviewed and no unsafe conditions have been identified. Family reports that there are at least two dedicated grounded, 3-prong outlets with clearly labeled circuit breaker has been established in the bedroom for power module and Charity fundraiser.   Both patient and caregivers have been trained on the following:  1. HM III LVAD overview of system operations  2. Overview of major lifestyle accommodations and cautions   3. Overview of system components (features and functions) 4. Changing power sources 5. Overview of alerts and alarms 6. How to identify and manage an emergency including when pump is running and when pump has stopped  7. Changing system controller 8. Maintain emergency contact list and medications  The patient and caregiver(s) have successfully demonstrated:  1. Changing power source (from batteries to power module, power module to  batteries, and replacing batteries) 2. Perform system controller self test  3. Perform power module self test  4. Check and charge batteries  5. Change system controller 6. Paged VAD pager and programmed number in phones  A daily flow sheet with patient  weight, temperature,  flow, speed, power, and PI, along with daily self checks on system controller and power module have been performed by patient and caregiver(s) during hospitalization and will also be done daily at home.   The caregiver(s) has been trained on percutaneous lead exit site care, care of the driveline and dressing changes. They have performed dressing changes during patient's hospitalization under my supervision with the support of the nursing staff. The importance of lead immobilization has been stressed to patient and caregiver(s) using the attachment device. The caregiver has successfully demonstrated the following: 1. Cleansing site  with sterile technique 2. Dressing care and maintenance  3. Immobilizing driveline  The following routine activities and maintenance have been reviewed with patient and caregiver(s) and both verbalize understanding:  1. Stressed importance of never disconnecting power from both controller power leads at the same time, and never disconnecting both batteries at the same time, or the pump will stop 2. Plug the power module (PM) or the universal Charity fundraiser (UBC) into properly grounded (3 prong) outlets dedicated to PM use. Do NOT use adapter (cheater plug) for ungrounded outlets or multiple portable socket outlets (power strips) 3. Do not connect the PM or UBC to an outlet controlled by wall switch or the device may not work 4. The PM's internal battery (that provides limited backup power to the LVAD in the event of AC mains power failure) remains charged as long as the PM is connected to [facility] or DC power 5. The PM's internal battery provides approximately 30 minutes of emergency backup power for the LVAD 6. Transfer from PM to batteries during [facility] mains power failure. The PM has internal backup battery that will power the pump while you transfer to batteries 7. Keep a backup system controller, charged batteries, battery clips, and             flashlight near you during sleep in case of electrical power outage 8. Clean battery, battery clip, and universal battery charger contacts weekly 9. Visually inspect percutaneous lead daily 10. Check cables and connectors when changing power source  11. Rotate batteries; keep all eight batteries charged 12. Always have backup system controller, battery clips, fully charged batteries, and spare fully charged batteries when traveling 13. Re-calibrate batteries every 70 uses; monitor  battery life of 36 months or 360 uses; replace batteries at end of battery life   Identified the following changes in activities of daily living with pump:  1. No driving for at  least six weeks and then only if doctor gives    permission to do so 2. No tub baths while pump implanted, and shower only if doctor gives    permission 3. No swimming or submersion in water while implanted with pump 4. Keep all VAD equipment away from water or moisture 5. Keep all VAD connections clean and dry 6. No contact sports or engage in jumping activities 7. Avoid strong static electricity (touching TV/computer screens, vacuuming) 8. Never have an MRI while implanted with the pump 9. Never leave or store batteries in extremely hot or cold places (such as   trunk of your car), or the battery life will be shortened 10. Call the doctor or hospital contact person if any change in how the pump            sounds, feels, or works 11. Plan to sleep only when connected to the power module. 12. Keep a backup system controller, charged batteries, battery clips, and             flashlight near you during sleep in case of electrical power outage 13. Do not sleep on your stomach 14. Talk with doctor before any long distance travel plans 15. Patient will need antibiotics prior to any dental procedure; instructed to contact VAD coordinator before any dental procedures (including routine cleaning)   Discharge binder given to patient and include the following:  1. Discharge instructions sheet 2. List of emergency contacts 3. Wallet card 4. HM III Luggage tags 5. Guide to Percutaneous Lead Care and Equipment Maintenance 6. HM III Alarms for Patients and their Caregivers 7. HM III Patient Handbook 8. HM III Patient Education Program DVD 9. HM III Information and Emergency Assistance Guide 10. HM III Power Module Alarms, Care, Maintenance 11. Daily diary sheets 12. [place] VAD Patient Education Booklet 13. Care of the Percutaneous Lead 14. HM III LVAS Emergency Response Checklist   Discharge equipment includes:  1. Two system controllers with preset: Fixed speed of 5600 RPM Low speed  limit   5300 RPM 2. One power module (PM) with 20' patient cable 3. One universal Charity fundraiser (UBC) 4. Eight fully charged batteries  5. Four battery clips 6. One travel case 7. One holster vest 8. Wearable accessory package 9. Shower bags (2) 10. 20 dressings and 5 anchors 11. DC Development worker, community  Following notification process completed with: [facility] [employer]  Dr. Ola Spurr,  PCP   Discussed frequency and importance of INR checks; emphasized importance of maintaining INR goal to prevent clotting and or bleeding issues with pump.  He has been on coumadin in the past and is familiar with management. Able to answer questions and asked good questions pertaining to warfarin and diet/lifestyle changes necessary to be successful and safe.    Based on information received from [facility] III manufacturer (St. [employer]) about reported difficulties that have occurred with some patients and caregivers when changing from primary to back up pocket controller, the HM III LVAD addendum document 696789 was given and reviewed with pt and caregiver.  Additional instructions were provided as follows:        1. Patient should have a caregiver present during system controller exchange or call     911 and VAD pager  before attempting to change controller if alone. 2. As part of the weekly safety checklist, the patient and caregiver should review and      understand the steps and instructions involved with replacing the system controller.  3. As part of monthly safety checklist, the patient and caregiver should review and                understand the system controller alarms and how to resolve them.  4. Updated copy of HM II Guide to Replacing the Running System Controller with the              Backup Controller for Patients and Their Caregiveriven reviewed and given to the             patient.  5. HM II Home Flowsheets given that include the weekly and monthly checks as      noted  above.  The patient has completed a proficiency test for the HM III and all questions have been answered. The pt and family have been instructed to call if any questions, problems, or concerns arise. Pt and caregiver successfully paged VAD coordinator using VAD pager emergency number and have been instructed to use this number only for emergencies. Patient and caregiver(s) asked appropriate questions, had good interaction with VAD coordinator, and verbalized understanding of above instructions.  10 daily dressing and 6 anchors given to patient.  Balinda Quails RN, VAD Coordinator 24/7 pager [phone]

## 2017-07-03 NOTE — Progress Notes (Signed)
[id number] Discussed referring back to CRP 2 [place] with pt. Referral made. Gave ex ed to get back to walking as tolerated. Pt to have rollator and knows to rest as needed. Will refer to dietitian at HD for renal and diabetic ed since now on dialysis. Encouraged IS and reviewed sternal precautions. Graylon Good RN BSN 07/03/2017 11:48 AM

## 2017-07-03 NOTE — Discharge Summary (Signed)
Advanced Heart Failure Team  Discharge Summary   Patient ID: [name] MRN: [id number], DOB/AGE: [birth year] 52 y.o. Admit date: 06/07/2017 D/C date:     07/04/2017   Primary Discharge Diagnoses:  1. A/C Systolic Heart Failure  S/P HMIII 06/12/2017  Aspirin 325 mg daily  Coumadin INR goal 2-3  Speed 5600 2. AKI on CKD  On iHD 06/21/2017   AVF placed 06/26/2017  3. CAD 4. H/O DVTs 5. Chronic N/V/D with gastroparesis 6.DM 7. S/P Multiple Tooth Extractions   4-15, 19-23, and 27-29.  8. PAF  On amio 200 mg dialy  On coumadin  9. ID 10. Hyponatremia 11. Anemia  12. Left Pleural Effusion   S/P Thoracentesis 06/28/17 13. Hypokalemia    Hospital Course:  51 yo with history of CAD s/p anterior MI in 2831, chronic systolic CHF, ICM, CKD Stage III, and Factor V Leiden.    Discussed at [facility] on August 27th and deemed an appropriate candidate for HMIII. Discussed at [facility] with Dr Mosetta Pigeon and deemed appropriate for [place].   Admitted for multiple tooth extractions and HF optimization prior to [place] LVAD implant. On the day admit he underwent RHC due to worsening renal function and ongoing dyspnea with exertion. RHC completed and showed marginal cardiac index and creatinine was 2.1. Gordy Councilman was left in place to optimize and he was started on milrinone.    On October 4th he underwent multiple tooth extractions by Dr Orene Desanctis. He had uncomplicated post operative course from dental procedure.  Prior to LVAD implant he was [facility] as he was stable on inotropes.  On October 8th he underwent HMIII implant.Extubated on post op day 1. As he improved pressors weaned off. Unfortunately renal function worsened and he developed pulmonary edema. Despite high dose diuretics he limited urine output.   Nephrology consulted and he was started on CVVHD October 11th.. This was later stopped on October 15th. On October 17th he started iHD without difficulty. Vascular consulted for accesss and  placed AV fistula.   He was maintained on iHD without difficulty. He has no evidence of renal recovery.   Developed L pleural effusion and underwent left thoracentesis on 10/24. Plan to repeat CXR in 2 weeks.   Ramp ECHO completed on October 24th with speed increased to 5600.  He received extensive education by the LVAD coordinators. He will continue aspirin 325 mg daily with Factor V Leiden. Continue coumadin with INR goal 2-3. He will receive [facility] for HHRN/HHPT. He will continue to be followed closely in the VAD clinic.   He was set up for ongoing ihd in the community at [facility]. He will follow up 10/[facility] at [facility] on [address].  At the time of discharge he was stable and ambulating with assistance.    1. Acute on chronic systolic CHF: Ischemic cardiomyopathy. [employer] ICD. NSTEMI in March 2018 with DES to LAD and RCA, complicated by cardiogenic shock, low output requiring milrinone. ECHO 8/18 with EF 20-25%, moderate MR.  He was admitted and started on milrinone with improved creatinine.  HeartMate 3 VAD placement on 06/12/17 . Speed cut back to 5400 with onset of afib/RVR 10/11.   - Drips gradually weaned off as he improved. Plan to continue sildenafil for RV support.  - Ramp ECHO completed prior to d/c with speed increased to 5600.  Maintained on heparin until INR was therapeutic.     INR Goal 2-3 (aim slightly higher with h/o Factor V Leiden heterozygosity)  and ASA 325. Plan to continue at this dose Factor V Leiden.  2. AKI on CKD:  Nephrology consulted 06/15/17 . Started CVVHD 06/15/17 and stopped on 06/19/17.  Started iHD on 06/21/2017. Vascular consulted for access. Permcath and AV fistula placed 06/26/17.  -HD set up at [facility]. Mon-Wed-Fri. He will be at HD [facility] at 11:50 on Wednesday.   - No signs of renal recovery yet.   3. CAD: NSTEMI in 3/18. LHC with 99% ulcerated lesion proximal RCA with left to right collaterals, 95% mid LAD stenosis after mid LAD  stent. s/p PCI to RCA and LAD on 11/25/16.  - No s/s of ischemia.    - Off plavix for LVAD.  On coumadin.  - Continue statin.   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 Recent V/Q scan did not show acute or chronic PE.  He is a heterozygote for Factor V Leiden, plan for ASA 325 and warfarin INR 2-3 with LVAD. - INR 2.55 on the day of discharge. Check INR on 11/1/  - Continue coumadin. .   5. Chronic N/V/D with gastroparesis: Resolved.  6. DM: On Jardiance at home.  Followed [facility]. He will follow up with his PCP.    7. S/P Multiple Tooth Extractions 4-15, 19-23, and 27-29.  - Stable.  8. PAF:  - Briefly in A Fib. Converted with amio drip. Plan to continue 271m amio daily.   9. ID: CXR with LLL atelectasis versus infiltrate. Had course of abx for HCAP. Received  Vancomycin and zosyn stopped 10/20.  10. Hyponatremia:  - Sodium dropped to 127. Free water restricted.  Sodium on the day of discharge 131. Repeat BMET later this week.  11. Anemia:  Followed closely. .  12. Left Pleural Effusion: S/P Thoracentesis 10/24 with 1 liter removed.  Repeat CXR in 2 weeks.  13. Hypokalemia  Followed closely and replaced as needed.    HeartMate 3:  Flow 4.6  liters/min, speed 5600 rpm, power 4 , PI 3.1 . No PI events. Personally reviewed  Discharge Weight: 186 pounds. Discharge Vitals: Blood pressure 121/66, pulse 94, temperature 98.5 F (36.9 C), temperature source Oral, resp. rate 18, height 5' 8"  (1.727 m), weight 178 lb 12.7 oz (81.1 kg), SpO2 98 %.  Labs: Lab Results  Component Value Date   WBC 10.0 07/03/2017   HGB 9.8 (L) 07/03/2017   HCT 30.4 (L) 07/03/2017   MCV 81.7 07/03/2017   PLT 340 07/03/2017     Recent Labs Lab 07/03/17 0037  NA 131*  K 4.7  CL 98*  CO2 23  BUN 49*  CREATININE 6.95*  CALCIUM 8.6*  GLUCOSE 202*   Lab Results  Component Value Date   CHOL 128 05/04/2017   HDL 50 05/04/2017   LDLCALC 54 05/04/2017   TRIG 118 05/04/2017   BNP (last 3  results)  Recent Labs  06/18/17 2305 06/26/17 0004 07/03/17 0037  BNP 746.0* 1,409.3* 758.9*    ProBNP (last 3 results) No results for input(s): PROBNP in the last 8760 hours.   Diagnostic Studies/Procedures   Dg Chest 2 View  Result Date: 07/03/2017 CLINICAL DATA:  Left ventricular assist device placed 2 weeks ago. No current chest complaints. History of ischemic cardiomyopathy, diabetes, hypertension. EXAM: CHEST  2 VIEW COMPARISON:  Portable chest x-ray of June 29, 2017 FINDINGS: The lungs are less well inflated today. There is persistent increased density at the left lung base with obscuration of the hemidiaphragm. The cardiac silhouette is enlarged.  The left ventricular assist device as well as the ICD are in stable position. The dual-lumen dialysis catheter tip projects at the cavoatrial junction. IMPRESSION: Increased hypoinflation today. Persistent left basilar atelectasis or infiltrate with small left pleural effusion. Stable cardiomegaly without pulmonary vascular congestion. The support devices are in stable position. Electronically Signed   By: David  Martinique M.D.   On: 07/03/2017 07:30    Discharge Medications   Allergies as of 07/03/2017      Reactions   Metformin And Related Diarrhea      Medication List    STOP taking these medications   carvedilol 6.25 MG tablet Commonly known as:  COREG   clopidogrel 75 MG tablet Commonly known as:  PLAVIX   digoxin 0.125 MG tablet Commonly known as:  LANOXIN   empagliflozin 25 MG Tabs tablet Commonly known as:  JARDIANCE   enoxaparin 100 MG/ML injection Commonly known as:  LOVENOX   gabapentin 300 MG capsule Commonly known as:  NEURONTIN   isosorbide-hydrALAZINE 20-37.5 MG tablet Commonly known as:  BIDIL   magnesium oxide 400 MG tablet Commonly known as:  MAG-OX   spironolactone 25 MG tablet Commonly known as:  ALDACTONE   torsemide 20 MG tablet Commonly known as:  DEMADEX     TAKE these medications    amiodarone 200 MG tablet Commonly known as:  PACERONE Take 1 tablet (200 mg total) by mouth daily.   aspirin 325 MG EC tablet Take 1 tablet (325 mg total) by mouth daily.   atorvastatin 80 MG tablet Commonly known as:  LIPITOR Take 1 tablet (80 mg total) by mouth daily at 6 PM.   docusate sodium 100 MG capsule Commonly known as:  COLACE Take 2 capsules (200 mg total) by mouth daily.   hydrALAZINE 25 MG tablet Commonly known as:  APRESOLINE Take 25 mg three times a day on non hemodialysis days   insulin aspart 100 UNIT/ML injection Commonly known as:  novoLOG Inject 3 Units into the skin 3 (three) times daily with meals. What changed:  how much to take  when to take this   insulin glargine 100 UNIT/ML injection Commonly known as:  LANTUS Inject 0.05 mLs (5 Units total) into the skin daily. What changed:  how much to take   metoCLOPramide 5 MG tablet Commonly known as:  REGLAN Take one tablet 1/2 hr before meals. What changed:  how to take this  when to take this  additional instructions   nitroGLYCERIN 0.4 MG SL tablet Commonly known as:  NITROSTAT Place 1 tablet (0.4 mg total) under the tongue every 5 (five) minutes as needed for chest pain.   ondansetron 4 MG disintegrating tablet Commonly known as:  ZOFRAN ODT Take 1 tablet (4 mg total) by mouth every 6 (six) hours as needed for nausea or vomiting.   pantoprazole 40 MG tablet Commonly known as:  PROTONIX Take 40 mg by mouth daily.   sildenafil 20 MG tablet Commonly known as:  REVATIO Take 1 tablet (20 mg total) by mouth 3 (three) times daily.   warfarin 5 MG tablet Commonly known as:  COUMADIN Mon-Wed-Fri  7.5 mg daily. On all other days 5 mg What changed:  additional instructions       Disposition   The patient will be discharged in stable condition to home. Discharge Instructions    Amb Referral to Cardiac Rehabilitation    Complete by:  As directed    Diagnosis:  Heart Failure (see  criteria below if  ordering Phase II)   Heart Failure Type:  Chronic Systolic Comment - LVAD    Diet - low sodium heart healthy    Complete by:  As directed    Heart Failure patients record your daily weight using the same scale at the same time of day    Complete by:  As directed    INR  Goal: 2 - 3    Complete by:  As directed    Goal:  2 - 3   Increase activity slowly    Complete by:  As directed    Page VAD Coordinator at [phone]  Notify for: any VAD alarms, sustained elevations of power >10 watts, sustained drop in Pulse Index <3    Complete by:  As directed    Notify for:   any VAD alarms sustained elevations of power >10 watts sustained drop in Pulse Index <3     Speed Settings:    Complete by:  As directed    Fixed 5600 RPM Low 5300 RPM     Follow-up Information    Lenn Cal, DDS Follow up.   Specialty:  Dentistry Why:  Call for follow up apointment once discharged from the hospital Contact information: [place] [address] [phone]        [facility], [place] Follow up.   Why:  HHPT, HHOT Contact information: [place] [address] [place] Follow up.   Why:  rolling walker Contact information: [place] [address] [place] Follow up on 07/06/2017.   Specialty:  Cardiology Why:  11 am in the advanced heart failure clinic. Parking code is 8001. Contact information: [address] [id number] [place] [phone]            Duration of Discharge Encounter: Greater than 35 minutes   Signed, Darrick Grinder NP-C  07/04/2017, 9:43 AM

## 2017-07-03 NOTE — Progress Notes (Signed)
Physical Therapy Cancellation Note   07/03/17 0843  PT Visit Information  Last PT Received On 07/03/17  Reason Eval/Treat Not Completed Patient at procedure or test/unavailable. Pt in HD. PT will check on pt later as time allows.    Earney Navy, PTA Pager: [phone]

## 2017-07-03 NOTE — Care Management Note (Addendum)
Case Management Note Previous Note Created by [name]  Patient Details  Name: [name] MRN: [id number] Date of Birth: March 01, 1966  Subjective/Objective:     From home, post op LVAD, conts on vent.   10/10 [place], BSN - POD2 LVAD implant, extubated, conts on lasix drip, inotropes, norepi, dopamine and epi.  PT eval rec HHPT , NCM will cont to follow progression.      10/19 [place], BSN - milrinone stopped today, aim for dc Monday or Tuesday, will need [place] set up next week.   10/22 [name] RN, BSN - conts to be oliguric with rising CVP, conts on heparin drip, s/p tunneled catheter and av fistula today for HD.  Plan is home with [facility] when stable.    10/24 11:10 [name] RN, BSN - patient chose [facility] for HHPT/HHOT, and rolling walker, referral made to [facility] with [facility] , soc will begin 24-48hrs post dc.  Patient's wife does dressing changes for him,  He will have HD on M-W-F, Clip in process.   S/P L thoracentesis for post op pl effusion.  Conts on Heparin/coumadin bridge.  He will be following up with Heart Failure Clinic also. NCM contacted [place] with  Heart Failure Clinic , she will make sure orders get put in.                             Action/Plan: NCM will follow LVAD patient  for dc needs.  Expected Discharge Date:                  Expected Discharge Plan:  [place]  In-House Referral:     Discharge planning Services  CM Consult  Post Acute Care Choice:  Durable Medical Equipment, Home Health Choice offered to:  Patient  DME Arranged:  Walker rolling with seat DME Agency:  [facility] Arranged:  PT, OT [employer] Agency:  [facility]  Status of Service:  Completed, signed off  If discussed at [place] of Stay Meetings, dates discussed:    Additional Comments: 07/03/2017  CM verified with pharmacy that Revatio prescription will cost pt $15 and does not require prior  authorization.  Pt to discharge home today.  Pt has 24 hour supervision provided by daughter and son.  Pt given choice for both HH and DME - pt chose [facility].   [facility] contacted and are aware that pt is discharging home today - agency accepting of [place] recommended based on HF orders. Pt has PCP and active insurance.  CM submitted Revatio benefit check  06/30/17 CM spoke with pt, daughter and son.  Pt will have 24 hour supervision at discharge.  [facility] continues to follow for discharge - tentative Monday 07/03/17 Maryclare Labrador, RN 07/03/2017, 10:29 AM

## 2017-07-03 NOTE — Progress Notes (Signed)
Discussed and explained discharged instructions, follow up appt. And prescriptions given to patient.pt going home with LVAD equipment,

## 2017-07-03 NOTE — Procedures (Signed)
Patient seen and examined on Hemodialysis. QB 400 R IJ PC UF goal 2L.  Tolerating treatment well.   Treatment adjusted as needed.  Madelon Lips MD [facility]  pgr [phone] 9:20 AM

## 2017-07-03 NOTE — Progress Notes (Signed)
7 Days Post-Op Procedure(s) (LRB): INSERTION OF TUNNELED  DIALYSIS CATHETER RIGHT INTERNAL JUGULAR (Right) ARTERIOVENOUS (AV) FISTULA CREATION VERSUS GRAFT RIGHT  ARM (Right) Subjective: Stable on HD Incisions clean and dry Chest tube sutures removed CXR this am personally reviewed and is clear for DC home  Objective: Vital signs in last 24 hours: Temp:  [97.6 F (36.4 C)-98.2 F (36.8 C)] 97.6 F (36.4 C) (10/29 0658) Pulse Rate:  [78-90] 88 (10/29 0930) Cardiac Rhythm: Normal sinus rhythm (10/29 0650) Resp:  [18-26] 18 (10/29 0930) BP: (96-119)/(59-90) 107/59 (10/29 0930) SpO2:  [98 %-100 %] 98 % (10/29 0653) Weight:  [185 lb 10 oz (84.2 kg)-186 lb 8.2 oz (84.6 kg)] 186 lb 8.2 oz (84.6 kg) (10/29 0658)  Hemodynamic parameters for last 24 hours:  nsr  Intake/Output from previous day: 10/28 0701 - 10/29 0700 In: 720 [P.O.:720] Out: -  Intake/Output this shift: No intake/output data recorded.       Exam    General- alert and comfortable   Lungs- clear without rales, wheezes   Cor- regular rate and rhythm, no murmur , gallop   Abdomen- soft, non-tender   Extremities - warm, non-tender, minimal edema   Neuro- oriented, appropriate, no focal weakness   Lab Results:  Recent Labs  07/02/17 0158 07/03/17 0037  WBC 11.2* 10.0  HGB 10.3* 9.8*  HCT 31.6* 30.4*  PLT 380 340   BMET:  Recent Labs  07/02/17 0158 07/03/17 0037  NA 132* 131*  K 4.5 4.7  CL 96* 98*  CO2 23 23  GLUCOSE 147* 202*  BUN 25* 49*  CREATININE 4.93* 6.95*  CALCIUM 8.7* 8.6*    PT/INR:  Recent Labs  07/03/17 0037  LABPROT 27.3*  INR 2.55   ABG    Component Value Date/Time   PHART 7.361 06/15/2017 0334   HCO3 23.3 06/15/2017 0334   TCO2 28 06/18/2017 1626   ACIDBASEDEF 2.0 06/15/2017 0334   O2SAT 59.6 06/24/2017 0330   CBG (last 3)   Recent Labs  07/02/17 1924 07/02/17 2310 07/03/17 0333  GLUCAP 187* 202* 178*    Assessment/Plan: S/P Procedure(s) (LRB): INSERTION  OF TUNNELED  DIALYSIS CATHETER RIGHT INTERNAL JUGULAR (Right) ARTERIOVENOUS (AV) FISTULA CREATION VERSUS GRAFT RIGHT  ARM (Right) Ok for DC home on 5600 RPM speed HM-3 CXR in clinic in 2 weeks to followup L pleural efffusion   LOS: 26 days    Tharon Aquas Trigt III 07/03/2017

## 2017-07-03 NOTE — Discharge Instructions (Addendum)
AV Fistula Placement, Care After Refer to this sheet in the next few weeks. These instructions provide you with information about caring for yourself after your procedure. Your health care provider may also give you more specific instructions. Your treatment has been planned according to current medical practices, but problems sometimes occur. Call your health care provider if you have any problems or questions after your procedure. What can I expect after the procedure? After your procedure, it is common to:  Feel sore.  Have numbness.  Feel cold.  Feel a vibration (thrill) over the fistula.  Follow these instructions at home:  Incision care  Do not take baths or showers, swim, or use a hot tub until your health care provider approves.  Keep the area around your cut from surgery (incision) clean and dry.  Follow instructions from your health care provider about how to take care of your incision. Make sure you: ? Wash your hands with soap and water before you change your bandage (dressing). If soap and water are not available, use hand sanitizer. ? Change your dressing as told by your health care provider. ? Leave stitches (sutures) and clips in place. They may need to stay in place for 2 weeks or longer. Fistula Care  Check your fistula site every day to make sure the thrill feels the same.  Check your fistula site every day for signs of infection. Watch for: ? Redness. ? Swelling. ? Discharge. ? Tenderness. ? Enlargement.  Keep your arm raised (elevated) while you rest.  Do not lift anything that is heavier than a gallon of milk with the arm that has the fistula.  Do not lie down on your fistula arm.  Do not let anyone draw blood or take a blood pressure reading on your fistula arm.  Do not wear tight jewelry or clothing over your fistula arm. General instructions  Rest at home for a day or two.  Gradually start doing your usual activities again. Ask your  surgeon when you can return to work or school.  Take over-the-counter and prescription medicines only as told by your health care provider.  Keep all follow-up visits as told by your health care provider. This is important. Contact a health care provider if:  You have chills or a fever.  You have pain at your fistula site that is not going away.  You have numbness or coldness at your fistula site that is not going away.  You feel a decrease or a change in the thrill.  You have swelling in your arm or hand.  You have redness, swelling, discharge, tenderness, or enlargement at your fistula site. Get help right away if:  You are bleeding from your fistula site.  You have chest pain.  You have trouble breathing. This information is not intended to replace advice given to you by your health care provider. Make sure you discuss any questions you have with your health care provider. Document Released: 08/22/2005 Document Revised: 12/30/2015 Document Reviewed: 11/12/2014 Elsevier Interactive Patient Education  2018 [employer] AFTER SURGERY  FACTS:  Ice used in ice bag helps keep the swelling down, and can help lessen the pain.  It is easier to treat pain BEFORE it happens.  Spitting disturbs the clot and may cause bleeding to start again, or to get worse.  Smoking delays healing and can cause complications.  Sharing prescriptions can be dangerous.  Do not take medications not recently prescribed for you.  Antibiotics may  stop birth control pills from working.  Use other means of birth control while on antibiotics.  Warm salt water rinses after the first 24 hours will help lessen the swelling:  Use 1/2 teaspoonful of table salt per oz.of water.  DO NOT:  Do not spit.  Do not drink through a straw.  Strongly advised not to smoke, dip snuff or chew tobacco at least for 3 days.  Do not eat sharp or crunchy foods.  Avoid the area of surgery when chewing.  Do  not stop your antibiotics before your instructions say to do so.  Do not eat hot foods until bleeding has stopped.  If you need to, let your food cool down to room temperature.  EXPECT:  Some swelling, especially first 2-3 days.  Soreness or discomfort in varying degrees.  Follow your dentist's instructions about how to handle pain before it starts.  Pinkish saliva or light blood in saliva, or on your pillow in the morning.  This can last around 24 hours.  Bruising inside or outside the mouth.  This may not show up until 2-3 days after surgery.  Don't worry, it will go away in time.  Pieces of "bone" may work themselves loose.  It's OK.  If they bother you, let us know.  WHAT TO DO IMMEDIATELY AFTER SURGERY:  Bite on the gauze with steady pressure for 1-2 hours.  Don't chew on the gauze.  Do not lie down flat.  Raise your head support especially for the first 24 hours.  Apply ice to your face on the side of the surgery.  You may apply it 20 minutes on and a few minutes off.  Ice for 8-12 hours.  You may use ice up to 24 hours.  Before the numbness wears off, take a pain pill as instructed.  Prescription pain medication is not always required.  SWELLING:  Expect swelling for the first couple of days.  It should get better after that.  If swelling increases 3 days or so after surgery; let us know as soon as possible.  FEVER:  Take Tylenol every 4 hours if needed to lower your temperature, especially if it is at 100F or higher.  Drink lots of fluids.  If the fever does not go away, let us know.  BREATHING TROUBLE:  Any unusual difficulty breathing means you have to have someone bring you to the emergency room ASAP  BLEEDING:  Light oozing is expected for 24 hours or so.  Prop head up with pillows  Avoid spitting  Do not confuse bright red fresh flowing blood with lots of saliva colored with a little bit of blood.  If you notice some bleeding, place gauze or a tea  bag where it is bleeding and apply CONSTANT pressure by biting down for 1 hour.  Avoid talking during this time.  Do not remove the gauze or tea bag during this hour to "check" the bleeding.  If you notice bright RED bleeding FLOWING out of particular area, and filling the floor of your mouth, put a wad of gauze on that area, bite down firmly and constantly.  Call us immediately.  If we're closed, have someone bring you to the emergency room.  ORAL HYGIENE:  Brush your teeth as usual after meals and before bedtime.  Use a soft toothbrush around the area of surgery.  DO NOT AVOID BRUSHING.  Otherwise bacteria(germs) will grow and may delay healing or encourage infection.  Since you cannot spit, just  gently rinse and let the water flow out of your mouth.  DO NOT SWISH HARD.  EATING:  Cool liquids are a good point to start.  Increase to soft foods as tolerated.  PRESCRIPTIONS:  Follow the directions for your prescriptions exactly as written.  If Dr. Enrique Sack gave you a narcotic pain medication, do not drive, operate machinery or drink alcohol when on that medication.  QUESTIONS:  Call our office during office hours [phone] or call the Emergency Room at [phone].  Information on my medicine - Coumadin   (Warfarin)  This medication education was reviewed with me or my healthcare representative as part of my discharge preparation.  The pharmacist that spoke with me during my hospital stay was:  Nadine Counts, [facility]  Why was Coumadin prescribed for you? Coumadin was prescribed for you because you have a blood clot or a medical condition that can cause an increased risk of forming blood clots. Blood clots can cause serious health problems by blocking the flow of blood to the heart, lung, or brain. Coumadin can prevent harmful blood clots from forming. As a reminder your indication for Coumadin is:   Select from menu  What test will check on my response to Coumadin? While  on Coumadin (warfarin) you will need to have an INR test regularly to ensure that your dose is keeping you in the desired range. The INR (international normalized ratio) number is calculated from the result of the laboratory test called prothrombin time (PT).  If an INR APPOINTMENT HAS NOT ALREADY BEEN MADE FOR YOU please schedule an appointment to have this lab work done by your health care provider within 7 days. Your INR goal is a number between:  2 to 3   What  do you need to  know  About  COUMADIN? Take Coumadin (warfarin) exactly as prescribed by your healthcare provider about the same time each day.  DO NOT stop taking without talking to the doctor who prescribed the medication.  Stopping without other blood clot prevention medication to take the place of Coumadin may increase your risk of developing a new clot or stroke.  Get refills before you run out.  What do you do if you miss a dose? If you miss a dose, take it as soon as you remember on the same day then continue your regularly scheduled regimen the next day.  Do not take two doses of Coumadin at the same time.  Important Safety Information A possible side effect of Coumadin (Warfarin) is an increased risk of bleeding. You should call your healthcare provider right away if you experience any of the following: ? Bleeding from an injury or your nose that does not stop. ? Unusual colored urine (red or dark brown) or unusual colored stools (red or black). ? Unusual bruising for unknown reasons. ? A serious fall or if you hit your head (even if there is no bleeding).  Some foods or medicines interact with Coumadin (warfarin) and might alter your response to warfarin. To help avoid this: ? Eat a balanced diet, maintaining a consistent amount of Vitamin K. ? Notify your provider about major diet changes you plan to make. ? Avoid alcohol or limit your intake to 1 drink for women and 2 drinks for men per day. (1 drink is 5 oz. wine, 12  oz. beer, or 1.5 oz. liquor.)  Make sure that ANY health care provider who prescribes medication for you knows that you are taking Coumadin (warfarin).  Also make sure the healthcare provider who is monitoring your Coumadin knows when you have started a new medication including herbals and non-prescription products.  Coumadin (Warfarin)  Major Drug Interactions  Increased Warfarin Effect Decreased Warfarin Effect  Alcohol (large quantities) Antibiotics (esp. Septra/Bactrim, Flagyl, Cipro) Amiodarone (Cordarone) Aspirin (ASA) Cimetidine (Tagamet) Megestrol (Megace) NSAIDs (ibuprofen, naproxen, etc.) Piroxicam (Feldene) Propafenone (Rythmol SR) Propranolol (Inderal) Isoniazid (INH) Posaconazole (Noxafil) Barbiturates (Phenobarbital) Carbamazepine (Tegretol) Chlordiazepoxide (Librium) Cholestyramine (Questran) Griseofulvin Oral Contraceptives Rifampin Sucralfate (Carafate) Vitamin K   Coumadin (Warfarin) Major Herbal Interactions  Increased Warfarin Effect Decreased Warfarin Effect  Garlic Ginseng Ginkgo biloba Coenzyme Q10 Green tea St. Johns wort    Coumadin (Warfarin) FOOD Interactions  Eat a consistent number of servings per week of foods HIGH in Vitamin K (1 serving =  cup)  Collards (cooked, or boiled & drained) Kale (cooked, or boiled & drained) Mustard greens (cooked, or boiled & drained) Parsley *serving size only =  cup Spinach (cooked, or boiled & drained) Swiss chard (cooked, or boiled & drained) Turnip greens (cooked, or boiled & drained)  Eat a consistent number of servings per week of foods MEDIUM-HIGH in Vitamin K (1 serving = 1 cup)  Asparagus (cooked, or boiled & drained) Broccoli (cooked, boiled & drained, or raw & chopped) Brussel sprouts (cooked, or boiled & drained) *serving size only =  cup Lettuce, raw (green leaf, endive, romaine) Spinach, raw Turnip greens, raw & chopped   These websites have more information on Coumadin (warfarin):   FailFactory.se; VeganReport.com.au;

## 2017-07-03 NOTE — Telephone Encounter (Signed)
Spoke with pt and reminded pt of remote transmission that is due today. Pt verbalized understanding.   

## 2017-07-04 ENCOUNTER — Telehealth (HOSPITAL_COMMUNITY): Payer: Self-pay

## 2017-07-04 NOTE — Progress Notes (Signed)
CSW attempted to follow up with patient prior to discharge although patient in dialysis. CSW will follow up through the [facility] and encourage attendance to the LVAD Support Group. Raquel Sarna, [place], [place]

## 2017-07-04 NOTE — Telephone Encounter (Signed)
Patients insurance is active and benefits verified with [employer] - $30.00 co-payment, no deductible, out of pocket amount is $2,000/$2,000 has been met, no co-insurance, and no pre-authorization is required. Passport/reference [id number]  Patient will be contacted and scheduled after their follow up appt with the cardiologist office upon review by the RN Navigator

## 2017-07-04 NOTE — Progress Notes (Signed)
Remote ICD transmission.   

## 2017-07-05 ENCOUNTER — Other Ambulatory Visit (HOSPITAL_COMMUNITY): Payer: Self-pay | Admitting: *Deleted

## 2017-07-05 ENCOUNTER — Encounter (HOSPITAL_COMMUNITY): Payer: BLUE CROSS/BLUE SHIELD

## 2017-07-05 DIAGNOSIS — N185 Chronic kidney disease, stage 5: Secondary | ICD-10-CM

## 2017-07-05 DIAGNOSIS — N189 Chronic kidney disease, unspecified: Secondary | ICD-10-CM

## 2017-07-05 DIAGNOSIS — N170 Acute kidney failure with tubular necrosis: Secondary | ICD-10-CM

## 2017-07-05 DIAGNOSIS — Z95811 Presence of heart assist device: Secondary | ICD-10-CM

## 2017-07-05 DIAGNOSIS — I5043 Acute on chronic combined systolic (congestive) and diastolic (congestive) heart failure: Secondary | ICD-10-CM

## 2017-07-05 DIAGNOSIS — Z7901 Long term (current) use of anticoagulants: Secondary | ICD-10-CM

## 2017-07-06 ENCOUNTER — Ambulatory Visit (HOSPITAL_COMMUNITY): Payer: Self-pay | Admitting: Pharmacist

## 2017-07-06 ENCOUNTER — Ambulatory Visit (HOSPITAL_COMMUNITY)
Admission: RE | Admit: 2017-07-06 | Discharge: 2017-07-06 | Disposition: A | Discharge status: Home / Self Care | Payer: BLUE CROSS/BLUE SHIELD | Source: Ambulatory Visit | Attending: Internal Medicine | Admitting: Internal Medicine

## 2017-07-06 VITALS — BP 114/81 | HR 78 | Ht 68.0 in | Wt 185.8 lb

## 2017-07-06 DIAGNOSIS — Z7901 Long term (current) use of anticoagulants: Secondary | ICD-10-CM

## 2017-07-06 DIAGNOSIS — I82499 Acute embolism and thrombosis of other specified deep vein of unspecified lower extremity: Secondary | ICD-10-CM

## 2017-07-06 DIAGNOSIS — Z5181 Encounter for therapeutic drug level monitoring: Secondary | ICD-10-CM

## 2017-07-06 DIAGNOSIS — Z95811 Presence of heart assist device: Secondary | ICD-10-CM | POA: Insufficient documentation

## 2017-07-06 DIAGNOSIS — I5022 Chronic systolic (congestive) heart failure: Secondary | ICD-10-CM

## 2017-07-06 DIAGNOSIS — I255 Ischemic cardiomyopathy: Secondary | ICD-10-CM | POA: Diagnosis not present

## 2017-07-06 DIAGNOSIS — N186 End stage renal disease: Secondary | ICD-10-CM | POA: Diagnosis not present

## 2017-07-06 DIAGNOSIS — N185 Chronic kidney disease, stage 5: Secondary | ICD-10-CM | POA: Diagnosis not present

## 2017-07-06 DIAGNOSIS — I2510 Atherosclerotic heart disease of native coronary artery without angina pectoris: Secondary | ICD-10-CM | POA: Diagnosis not present

## 2017-07-06 DIAGNOSIS — N183 Chronic kidney disease, stage 3 (moderate): Secondary | ICD-10-CM

## 2017-07-06 DIAGNOSIS — I5043 Acute on chronic combined systolic (congestive) and diastolic (congestive) heart failure: Secondary | ICD-10-CM | POA: Insufficient documentation

## 2017-07-06 DIAGNOSIS — N170 Acute kidney failure with tubular necrosis: Secondary | ICD-10-CM | POA: Diagnosis not present

## 2017-07-06 LAB — PROTIME-INR
INR: 3.83
Prothrombin Time: 37.4 seconds — ABNORMAL HIGH (ref 11.4–15.2)

## 2017-07-06 LAB — CBC
HEMATOCRIT: 32.6 % — AB (ref 39.0–52.0)
Hemoglobin: 10.5 g/dL — ABNORMAL LOW (ref 13.0–17.0)
MCH: 26.4 pg (ref 26.0–34.0)
MCHC: 32.2 g/dL (ref 30.0–36.0)
MCV: 81.9 fL (ref 78.0–100.0)
Platelets: 373 10*3/uL (ref 150–400)
RBC: 3.98 MIL/uL — ABNORMAL LOW (ref 4.22–5.81)
RDW: 18.8 % — ABNORMAL HIGH (ref 11.5–15.5)
WBC: 13.7 10*3/uL — ABNORMAL HIGH (ref 4.0–10.5)

## 2017-07-06 MED ORDER — HYDRALAZINE HCL 25 MG PO TABS: 37.5000 mg | ORAL_TABLET | Freq: Three times a day (TID) | ORAL | 6 refills | Status: DC

## 2017-07-06 NOTE — Progress Notes (Addendum)
Patient presents for 1st hospital  follow up in [facility] today. Reports no problems with VAD equipment or concerns with drive line.  Vital Signs:  Doppler Pressure 108   Automatc BP: 114/81 (97) HR: 78   SPO2:97  %  Weight: 185.8 lb w/o eqt Last weight: 186 lb hosp d/c wt Home weights: not doing lbs   VAD Indication: Destination Therapy due to kidney failure.   VAD interrogation & Equipment Management: Speed: 5600 Flow: 4.4 Power: 4.3 w    PI: 3.7  Alarms: no clinical alarms Events: rare (pt has had only one PI)  Fixed speed 5600 Low speed limit: 5300  Primary Controller:  Replace back up battery in 28 months. Back up controller:   Replace back up battery in 28 months.  Annual Equipment Maintenance on UBC/PM was performed on 10/18.   I reviewed the LVAD parameters from today and compared the results to the patient's prior recorded data. LVAD interrogation was NEGATIVE for significant power changes, NEGATIVE for clinical alarms and STABLE for PI events/speed drops. No programming changes were made and pump is functioning within specified parameters. Pt is performing daily controller and system monitor self tests along with completing weekly and monthly maintenance for LVAD equipment.  LVAD equipment check completed and is in good working order. Back-up equipment present. Charged back up battery and performed self-test on equipment.   Exit Site Care: Drive line is being maintained every other day  by pts daughter [name]. Drive line exit site well healed and incorporated. The velour is fully implanted at exit site. Dressing dry and intact. No erythema or drainage. Stabilization device present and accurately applied. Pt denies fever or chills. Pt states they have adequate dressing supplies at home.   Significant Events on VAD Support:    Device:St Jude Therapies: on Last check:    BP & Labs:  MAP 108 - Doppler is reflecting modified systolic.  Hgb 10.5  - No S/S of bleeding. Specifically denies melena/BRBPR or nosebleeds.  LDH stable at unable to obtain blood was hemolyzed with established baseline of 225- 270 Denies tea-colored urine. No power elevations noted on interrogation.   Plan: 1. Increase Hydralazine to 37.5 mg TID on non-dialysis days and add 25 mg the morning of and before bed the day of dialysis.  2. Will call the dialysis center about what pts BP is on/during dialysis. 3. Decrease dressing change to twice a week. 4. Return to clinic in 1 week. 5. Needs chest xray next week per PVT.  6. Pt will call and schedule appt with PCP.   [name] RN VAD Coordinator   Office: [phone] 24/7 Emergency VAD Pager: [phone]  Cardiology: Dr. Aundra Dubin  HPI: 51 yo with history of CAD s/p anterior MI in 2010 and NSTEMI in 3/18 with DES to pRCA and mLAD and ischemic cardiomyopathy now s/p Heartmate 3 LVAD and ESRD presents for followup of CHF/LVAD.  Patient developed low output heart failure.  He was not deemed to be a good transplant candidate.  He was admitted in 10/18 and Heartmate 3 LVAD was placed.  He had baseline CKD stage 3 likely from diabetic nephropathy and CHF.  He developed peri-op AKI and ended up on dialysis.  No renal recovery to date.  St Jude ICD has been turned back on and ramp echo was done prior to discharge in 10/18, speed increased to 5600 rpm.   Follow up for Heart Failure/LVAD:  He is doing well at first followup today.  Tolerating HD without any problems.  Mobility is improving but still using a walker.  Home PT has started.  No dyspnea walking around house or into office today.  No lightheadedness.  Minimal soreness at surgical site. VAD parameters stable, 1 PI event/day.  MAP in 90s.   Denies LVAD alarms.  Denies driveline trauma, erythema or drainage.  Denies ICD shocks.   Reports taking Coumadin as prescribed and adherence to anticoagulation based dietary restrictions.  Denies bright red blood per rectum or  melena, no dark urine or hematuria.    PMH: 1. Chronic systolic CHF: Ischemic cardiomyopathy. St Jude ICD.  - Echo (11/17) with EF 25-30%, moderate diastolic dysfunction, mild MR, mildly dilated RV with moderately decreased systolic function, peak RV-RA gradient 48 mmHg.  - Echo (3/18) with EF 25-30%, wall motion abnormalities, normal RV size and systolic function.  - Admission 3/18 with cardiogenic shock and AKI, required milrinone gtt.  - Echo (8/18) with EF 20-25%, mild LVH, moderate LV dilation, mild MR.  - CPX (8/18) with peak VO2 12.4, VE/VCO2 slope 35, RER 1.05 => severe HR limitation.  - Heartmate 3 LVAD 10/18.  2. CAD: Anterior MI 2005, had bifurcation stenting LAD/diagonal. Repeat cath 2010 with patent stents.  - NSTEMI 3/18: LHC with 99% ulcerated lesion proximal RCA, 95% mid LAD => PCI with DES to proximal RCA and mid LAD.  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  - Lower extremity venous dopplers (8/18): chronic DVT - V/Q scan (8/18): Normal, no PE.  4. Crohns disease: History of colectomy.  5. GERD 6. Gastroparesis 7. HTN 8. Hyperlipidemia 9. Type II diabetes  10. ESRD: Post-op LVAD surgery.  11. Carotid dopplers (8/18): mild bilateral stenosis.  12. PFTs (8/18): Mild restriction (no IDL on CT chest).  13. Lung nodules: CT chest 8/18 with small bilateral nodules.  14. PAD: ABIs with moderate bilateral disease in 8/18.   Current Outpatient Prescriptions  Medication Sig Dispense Refill  . amiodarone (PACERONE) 200 MG tablet Take 1 tablet (200 mg total) by mouth daily. 30 tablet 5  . aspirin EC 325 MG EC tablet Take 1 tablet (325 mg total) by mouth daily. 30 tablet 11  . atorvastatin (LIPITOR) 80 MG tablet Take 1 tablet (80 mg total) by mouth daily at 6 PM. 30 tablet 6  . docusate sodium (COLACE) 100 MG capsule Take 2 capsules (200 mg total) by mouth daily. 10 capsule 0  . hydrALAZINE (APRESOLINE) 25 MG tablet Take 1.5 tablets (37.5 mg total) by mouth 3 (three) times daily. Take  37.5 mg three times a day on non hemodialysis days and 25 mg the morning of dialysis and before bed on dialysis days. 12 tablet 6  . insulin aspart (NOVOLOG) 100 UNIT/ML injection Inject 3 Units into the skin 3 (three) times daily with meals. 1 vial 0  . insulin glargine (LANTUS) 100 UNIT/ML injection Inject 0.05 mLs (5 Units total) into the skin daily. 10 mL 11  . metoCLOPramide (REGLAN) 5 MG tablet Take one tablet 1/2 hr before meals. (Patient taking differently: Take by mouth 3 (three) times daily before meals. ) 90 tablet 2  . nitroGLYCERIN (NITROSTAT) 0.4 MG SL tablet Place 1 tablet (0.4 mg total) under the tongue every 5 (five) minutes as needed for chest pain. 25 tablet 3  . ondansetron (ZOFRAN ODT) 4 MG disintegrating tablet Take 1 tablet (4 mg total) by mouth every 6 (six) hours as needed for nausea or vomiting. 40 tablet 1  . pantoprazole (PROTONIX) 40  MG tablet Take 40 mg by mouth daily.    . sildenafil (REVATIO) 20 MG tablet Take 1 tablet (20 mg total) by mouth 3 (three) times daily. 90 tablet 6  . warfarin (COUMADIN) 5 MG tablet Take 5 mg (1 tablet) daily except 7.5 mg (1 and 1/2 tablets) on Mon/Wed.     No current facility-administered medications for this encounter.     Metformin and related  REVIEW OF SYSTEMS: All systems negative except as listed in HPI, PMH and Problem list.   LVAD INTERROGATION: See LVAD nurse's note above.   I reviewed the LVAD parameters from today, and compared the results to the patient's prior recorded data.  No programming changes were made.  The LVAD is functioning within specified parameters.  The patient performs LVAD self-test daily.  LVAD interrogation was negative for any significant power changes, alarms or PI events/speed drops.  LVAD equipment check completed and is in good working order.  Back-up equipment present.   LVAD education done on emergency procedures and precautions and reviewed exit site care.    Vitals:   07/06/17 1104 07/06/17  1105  BP: (!) 108/0 114/81  Pulse: 78   SpO2: 97%   Weight: 185 lb 12.8 oz (84.3 kg)   Height: 5' 8"  (1.727 m)     Physical Exam: GENERAL: Well appearing, male who presents to clinic today in no acute distress. HEENT: normal  NECK: Supple, JVP  Not elevated.  2+ bilaterally, no bruits.  No lymphadenopathy or thyromegaly appreciated.   CARDIAC:  Mechanical heart sounds with LVAD hum present.   LUNGS:  Clear to auscultation bilaterally.  ABDOMEN:  Soft, round, nontender, positive bowel sounds x4.     LVAD exit site: well-healed and incorporated.  Dressing dry and intact.  No erythema or drainage.  Stabilization device present and accurately applied.  Driveline dressing is being changed daily per sterile technique. EXTREMITIES:  Warm and dry, no cyanosis, clubbing, rash or edema  NEUROLOGIC:  Alert and oriented x 4.  Gait steady.  No aphasia.  No dysarthria.  Affect pleasant.      ASSESSMENT AND PLAN: 1. Chronic systolic CHF: Ischemic cardiomyopathy.  St Jude ICD.  NSTEMI in March 2018 with DES to LAD and RCA, complicated by cardiogenic shock, low output requiring milrinone.  Echo 8/18 with EF 20-25%, moderate MR.  PX (8/18) with severe functional impairment due to HF.  He is now s/p HM3 LVAD placement. LVAD parameters reviewed today and stable.  No complaints. MAP is elevated. - Given heterozygosity for factor V Leiden and history of clotting, will continue ASA at 325 mg daily and aim for coumadin with INR 2-3.  2. ESRD: Developed peri-operatively after HM3 placement.  He is tolerating HD without problems currently.  3. CAD: NSTEMI in 3/18. LHC with 99% ulcerated lesion proximal RCA with left to right collaterals, 95% mid LAD stenosis after mid LAD stent. s/p PCI to RCA and LAD on 11/25/16.  No exertional chest pain.  - Continue statin and ASA. 4. h/o DVTs: On warfarin.  5. Diabetic gastroparesis: Quiescent.  6. DM: Per internal medicine.  7. HTN: MAP still high.  I will increase  hydralazine to 37.5 mg tid on non-HD days, he will take hydralazine 25 mg in the am and in the evening on HD days (has not had trouble with hypotension during HD).  8. PAD: Moderate bilateral disease on ABIs in 8/18.  Denies claudication currently.   [name] 07/09/2017

## 2017-07-06 NOTE — Patient Instructions (Signed)
Plan: 1. Increase Hydralazine to 37.5 mg TID on non-dialysis days. 2. Will call the dialysis center about what pts BP is on/during dialysis. 3. Decrease dressing change to twice a week. 4. Return to clinic in 1 week.

## 2017-07-07 ENCOUNTER — Encounter (HOSPITAL_COMMUNITY): Payer: BLUE CROSS/BLUE SHIELD

## 2017-07-07 LAB — CUP PACEART REMOTE DEVICE CHECK
Battery Voltage: 3.04 V
Brady Statistic AP VS Percent: 1.3 %
Brady Statistic AS VP Percent: 1 %
Brady Statistic RA Percent Paced: 1 %
Brady Statistic RV Percent Paced: 1 %
HIGH POWER IMPEDANCE MEASURED VALUE: 38 Ohm
HighPow Impedance: 39 Ohm
Implantable Lead Implant Date: 20100430
Implantable Lead Implant Date: 20100430
Implantable Lead Location: 753860
Lead Channel Pacing Threshold Amplitude: 0.5 V
Lead Channel Pacing Threshold Pulse Width: 0.5 ms
Lead Channel Sensing Intrinsic Amplitude: 12 mV
Lead Channel Sensing Intrinsic Amplitude: 2.4 mV
Lead Channel Setting Sensing Sensitivity: 0.5 mV
MDC IDC LEAD LOCATION: 753859
MDC IDC MSMT BATTERY REMAINING LONGEVITY: 80 mo
MDC IDC MSMT BATTERY REMAINING PERCENTAGE: 89 %
MDC IDC MSMT LEADCHNL RA IMPEDANCE VALUE: 360 Ohm
MDC IDC MSMT LEADCHNL RA PACING THRESHOLD AMPLITUDE: 0.75 V
MDC IDC MSMT LEADCHNL RA PACING THRESHOLD PULSEWIDTH: 0.5 ms
MDC IDC MSMT LEADCHNL RV IMPEDANCE VALUE: 360 Ohm
MDC IDC PG IMPLANT DT: 20180110
MDC IDC SESS DTM: 20181029201913
MDC IDC SET LEADCHNL RA PACING AMPLITUDE: 2.5 V
MDC IDC SET LEADCHNL RV PACING AMPLITUDE: 2.5 V
MDC IDC SET LEADCHNL RV PACING PULSEWIDTH: 0.5 ms
MDC IDC STAT BRADY AP VP PERCENT: 1 %
MDC IDC STAT BRADY AS VS PERCENT: 98 %
Pulse Gen Serial Number: 7383809

## 2017-07-09 NOTE — Addendum Note (Signed)
Encounter addended by: Larey Dresser, MD on: 07/09/2017 4:22 PM  Actions taken: Sign clinical note, LOS modified, Visit diagnoses modified

## 2017-07-10 ENCOUNTER — Ambulatory Visit (HOSPITAL_COMMUNITY)
Admission: RE | Admit: 2017-07-10 | Discharge: 2017-07-10 | Disposition: A | Discharge status: Home / Self Care | Payer: BLUE CROSS/BLUE SHIELD | Source: Ambulatory Visit | Attending: Cardiology | Admitting: Cardiology

## 2017-07-10 ENCOUNTER — Inpatient Hospital Stay (HOSPITAL_COMMUNITY)
Admission: AD | Admit: 2017-07-10 | Discharge: 2017-07-16 | DRG: 073 | Disposition: A | Discharge status: Home Health | Payer: BLUE CROSS/BLUE SHIELD | Source: Ambulatory Visit | Attending: Cardiology | Admitting: Cardiology

## 2017-07-10 ENCOUNTER — Encounter (HOSPITAL_COMMUNITY): Payer: BLUE CROSS/BLUE SHIELD

## 2017-07-10 ENCOUNTER — Encounter (HOSPITAL_COMMUNITY): Payer: Self-pay

## 2017-07-10 VITALS — BP 142/104 | HR 73 | Resp 16 | Ht 68.0 in | Wt 186.8 lb

## 2017-07-10 DIAGNOSIS — D6851 Activated protein C resistance: Secondary | ICD-10-CM | POA: Diagnosis present

## 2017-07-10 DIAGNOSIS — I2510 Atherosclerotic heart disease of native coronary artery without angina pectoris: Secondary | ICD-10-CM | POA: Diagnosis present

## 2017-07-10 DIAGNOSIS — I4892 Unspecified atrial flutter: Secondary | ICD-10-CM | POA: Diagnosis not present

## 2017-07-10 DIAGNOSIS — I5022 Chronic systolic (congestive) heart failure: Secondary | ICD-10-CM | POA: Diagnosis present

## 2017-07-10 DIAGNOSIS — E785 Hyperlipidemia, unspecified: Secondary | ICD-10-CM | POA: Diagnosis present

## 2017-07-10 DIAGNOSIS — Z79899 Other long term (current) drug therapy: Secondary | ICD-10-CM

## 2017-07-10 DIAGNOSIS — Z992 Dependence on renal dialysis: Secondary | ICD-10-CM

## 2017-07-10 DIAGNOSIS — E871 Hypo-osmolality and hyponatremia: Secondary | ICD-10-CM | POA: Diagnosis present

## 2017-07-10 DIAGNOSIS — G43A1 Cyclical vomiting, intractable: Secondary | ICD-10-CM | POA: Diagnosis not present

## 2017-07-10 DIAGNOSIS — N186 End stage renal disease: Secondary | ICD-10-CM | POA: Diagnosis present

## 2017-07-10 DIAGNOSIS — Z8249 Family history of ischemic heart disease and other diseases of the circulatory system: Secondary | ICD-10-CM

## 2017-07-10 DIAGNOSIS — K449 Diaphragmatic hernia without obstruction or gangrene: Secondary | ICD-10-CM | POA: Diagnosis not present

## 2017-07-10 DIAGNOSIS — I739 Peripheral vascular disease, unspecified: Secondary | ICD-10-CM | POA: Diagnosis present

## 2017-07-10 DIAGNOSIS — I252 Old myocardial infarction: Secondary | ICD-10-CM | POA: Diagnosis not present

## 2017-07-10 DIAGNOSIS — E7800 Pure hypercholesterolemia, unspecified: Secondary | ICD-10-CM | POA: Diagnosis present

## 2017-07-10 DIAGNOSIS — N2581 Secondary hyperparathyroidism of renal origin: Secondary | ICD-10-CM | POA: Diagnosis present

## 2017-07-10 DIAGNOSIS — Z95811 Presence of heart assist device: Secondary | ICD-10-CM

## 2017-07-10 DIAGNOSIS — Z794 Long term (current) use of insulin: Secondary | ICD-10-CM

## 2017-07-10 DIAGNOSIS — E1121 Type 2 diabetes mellitus with diabetic nephropathy: Secondary | ICD-10-CM | POA: Diagnosis present

## 2017-07-10 DIAGNOSIS — I313 Pericardial effusion (noninflammatory): Secondary | ICD-10-CM | POA: Diagnosis present

## 2017-07-10 DIAGNOSIS — I132 Hypertensive heart and chronic kidney disease with heart failure and with stage 5 chronic kidney disease, or end stage renal disease: Secondary | ICD-10-CM | POA: Diagnosis present

## 2017-07-10 DIAGNOSIS — K5090 Crohn's disease, unspecified, without complications: Secondary | ICD-10-CM | POA: Diagnosis present

## 2017-07-10 DIAGNOSIS — Z833 Family history of diabetes mellitus: Secondary | ICD-10-CM

## 2017-07-10 DIAGNOSIS — Z7982 Long term (current) use of aspirin: Secondary | ICD-10-CM

## 2017-07-10 DIAGNOSIS — K3184 Gastroparesis: Secondary | ICD-10-CM | POA: Diagnosis present

## 2017-07-10 DIAGNOSIS — Z7901 Long term (current) use of anticoagulants: Secondary | ICD-10-CM

## 2017-07-10 DIAGNOSIS — E1122 Type 2 diabetes mellitus with diabetic chronic kidney disease: Secondary | ICD-10-CM | POA: Diagnosis present

## 2017-07-10 DIAGNOSIS — G47419 Narcolepsy without cataplexy: Secondary | ICD-10-CM | POA: Diagnosis present

## 2017-07-10 DIAGNOSIS — Z86718 Personal history of other venous thrombosis and embolism: Secondary | ICD-10-CM

## 2017-07-10 DIAGNOSIS — Z5181 Encounter for therapeutic drug level monitoring: Secondary | ICD-10-CM

## 2017-07-10 DIAGNOSIS — K219 Gastro-esophageal reflux disease without esophagitis: Secondary | ICD-10-CM | POA: Diagnosis present

## 2017-07-10 DIAGNOSIS — Z823 Family history of stroke: Secondary | ICD-10-CM

## 2017-07-10 DIAGNOSIS — Z9861 Coronary angioplasty status: Secondary | ICD-10-CM

## 2017-07-10 DIAGNOSIS — Z8371 Family history of colonic polyps: Secondary | ICD-10-CM

## 2017-07-10 DIAGNOSIS — D649 Anemia, unspecified: Secondary | ICD-10-CM | POA: Diagnosis present

## 2017-07-10 DIAGNOSIS — R1011 Right upper quadrant pain: Secondary | ICD-10-CM | POA: Diagnosis not present

## 2017-07-10 DIAGNOSIS — R791 Abnormal coagulation profile: Secondary | ICD-10-CM | POA: Diagnosis not present

## 2017-07-10 DIAGNOSIS — E8889 Other specified metabolic disorders: Secondary | ICD-10-CM | POA: Diagnosis present

## 2017-07-10 DIAGNOSIS — K2970 Gastritis, unspecified, without bleeding: Secondary | ICD-10-CM | POA: Diagnosis present

## 2017-07-10 DIAGNOSIS — F909 Attention-deficit hyperactivity disorder, unspecified type: Secondary | ICD-10-CM | POA: Diagnosis present

## 2017-07-10 DIAGNOSIS — E1143 Type 2 diabetes mellitus with diabetic autonomic (poly)neuropathy: Principal | ICD-10-CM | POA: Diagnosis present

## 2017-07-10 DIAGNOSIS — R112 Nausea with vomiting, unspecified: Secondary | ICD-10-CM | POA: Diagnosis present

## 2017-07-10 DIAGNOSIS — I10 Essential (primary) hypertension: Secondary | ICD-10-CM

## 2017-07-10 DIAGNOSIS — Z9049 Acquired absence of other specified parts of digestive tract: Secondary | ICD-10-CM

## 2017-07-10 DIAGNOSIS — Z95810 Presence of automatic (implantable) cardiac defibrillator: Secondary | ICD-10-CM

## 2017-07-10 DIAGNOSIS — I255 Ischemic cardiomyopathy: Secondary | ICD-10-CM | POA: Diagnosis present

## 2017-07-10 DIAGNOSIS — R110 Nausea: Secondary | ICD-10-CM | POA: Diagnosis not present

## 2017-07-10 DIAGNOSIS — Z951 Presence of aortocoronary bypass graft: Secondary | ICD-10-CM

## 2017-07-10 LAB — CBC
HEMATOCRIT: 28.5 % — AB (ref 39.0–52.0)
HEMOGLOBIN: 9.1 g/dL — AB (ref 13.0–17.0)
MCH: 25.5 pg — ABNORMAL LOW (ref 26.0–34.0)
MCHC: 31.9 g/dL (ref 30.0–36.0)
MCV: 79.8 fL (ref 78.0–100.0)
Platelets: 307 10*3/uL (ref 150–400)
RBC: 3.57 MIL/uL — ABNORMAL LOW (ref 4.22–5.81)
RDW: 16.7 % — AB (ref 11.5–15.5)
WBC: 10.1 10*3/uL (ref 4.0–10.5)

## 2017-07-10 LAB — BASIC METABOLIC PANEL
ANION GAP: 15 (ref 5–15)
BUN: 49 mg/dL — ABNORMAL HIGH (ref 6–20)
CALCIUM: 8.6 mg/dL — AB (ref 8.9–10.3)
CO2: 22 mmol/L (ref 22–32)
Chloride: 96 mmol/L — ABNORMAL LOW (ref 101–111)
Creatinine, Ser: 8.84 mg/dL — ABNORMAL HIGH (ref 0.61–1.24)
GFR, EST AFRICAN AMERICAN: 7 mL/min — AB (ref >60)
GFR, EST NON AFRICAN AMERICAN: 6 mL/min — AB (ref >60)
GLUCOSE: 220 mg/dL — AB (ref 65–99)
POTASSIUM: 4.5 mmol/L (ref 3.5–5.1)
Sodium: 133 mmol/L — ABNORMAL LOW (ref 135–145)

## 2017-07-10 LAB — PROTIME-INR
INR: 5.09
Prothrombin Time: 47.3 seconds — ABNORMAL HIGH (ref 11.4–15.2)

## 2017-07-10 LAB — HEPATIC FUNCTION PANEL
ALBUMIN: 2.7 g/dL — AB (ref 3.5–5.0)
ALK PHOS: 135 U/L — AB (ref 38–126)
ALT: 15 U/L — AB (ref 17–63)
AST: 26 U/L (ref 15–41)
BILIRUBIN TOTAL: 0.5 mg/dL (ref 0.3–1.2)
Bilirubin, Direct: <0.1 mg/dL — ABNORMAL LOW (ref 0.1–0.5)
TOTAL PROTEIN: 7.7 g/dL (ref 6.5–8.1)

## 2017-07-10 LAB — GLUCOSE, CAPILLARY
GLUCOSE-CAPILLARY: 123 mg/dL — AB (ref 65–99)
Glucose-Capillary: 201 mg/dL — ABNORMAL HIGH (ref 65–99)

## 2017-07-10 LAB — LACTATE DEHYDROGENASE: LDH: 255 U/L — ABNORMAL HIGH (ref 98–192)

## 2017-07-10 MED ORDER — ACETAMINOPHEN 325 MG PO TABS: 650.0000 mg | ORAL_TABLET | ORAL | Status: DC | PRN

## 2017-07-10 MED ORDER — HYDRALAZINE HCL 25 MG PO TABS
37.5000 mg | ORAL_TABLET | Freq: Three times a day (TID) | ORAL | Status: DC
  Administered 2017-07-12 – 2017-07-15 (×7): 37.5 mg via ORAL
  Filled 2017-07-10 (×19): qty 1.5

## 2017-07-10 MED ORDER — LIDOCAINE HCL (PF) 1 % IJ SOLN: 5.0000 mL | INTRAMUSCULAR | Status: DC | PRN

## 2017-07-10 MED ORDER — SODIUM CHLORIDE 0.9 % IV SOLN: 100.0000 mL | INTRAVENOUS | Status: DC | PRN

## 2017-07-10 MED ORDER — HEPARIN SODIUM (PORCINE) 1000 UNIT/ML DIALYSIS: 1000.0000 [IU] | INTRAMUSCULAR | Status: DC | PRN

## 2017-07-10 MED ORDER — ATORVASTATIN CALCIUM 80 MG PO TABS: 80.0000 mg | ORAL_TABLET | Freq: Every day | ORAL | Status: DC

## 2017-07-10 MED ORDER — DOCUSATE SODIUM 100 MG PO CAPS
200.0000 mg | ORAL_CAPSULE | Freq: Every day | ORAL | Status: DC
  Filled 2017-07-10 (×5): qty 2

## 2017-07-10 MED ORDER — PANTOPRAZOLE SODIUM 40 MG PO TBEC
40.0000 mg | DELAYED_RELEASE_TABLET | Freq: Every day | ORAL | Status: DC
  Administered 2017-07-10: 40 mg via ORAL
  Filled 2017-07-10 (×2): qty 1

## 2017-07-10 MED ORDER — ALTEPLASE 2 MG IJ SOLR: 2.0000 mg | Freq: Once | INTRAMUSCULAR | Status: DC | PRN

## 2017-07-10 MED ORDER — DARBEPOETIN ALFA 100 MCG/0.5ML IJ SOSY
100.0000 ug | PREFILLED_SYRINGE | INTRAMUSCULAR | Status: DC
  Administered 2017-07-10: 100 ug via INTRAVENOUS
  Filled 2017-07-10: qty 0.5

## 2017-07-10 MED ORDER — HYDRALAZINE HCL 20 MG/ML IJ SOLN: 10.0000 mg | INTRAMUSCULAR | Status: DC | PRN

## 2017-07-10 MED ORDER — INSULIN ASPART 100 UNIT/ML ~~LOC~~ SOLN
0.0000 [IU] | Freq: Every day | SUBCUTANEOUS | Status: DC
  Administered 2017-07-13: 2 [IU] via SUBCUTANEOUS

## 2017-07-10 MED ORDER — ASPIRIN EC 325 MG PO TBEC
325.0000 mg | DELAYED_RELEASE_TABLET | Freq: Every day | ORAL | Status: DC
  Filled 2017-07-10 (×2): qty 1

## 2017-07-10 MED ORDER — AMIODARONE HCL 200 MG PO TABS
200.0000 mg | ORAL_TABLET | Freq: Every day | ORAL | Status: DC
  Administered 2017-07-10: 200 mg via ORAL
  Filled 2017-07-10 (×2): qty 1

## 2017-07-10 MED ORDER — PENTAFLUOROPROP-TETRAFLUOROETH EX AERO: 1.0000 "application " | INHALATION_SPRAY | CUTANEOUS | Status: DC | PRN

## 2017-07-10 MED ORDER — METOCLOPRAMIDE HCL 5 MG/ML IJ SOLN
5.0000 mg | Freq: Two times a day (BID) | INTRAMUSCULAR | Status: DC
  Administered 2017-07-10: 5 mg via INTRAVENOUS
  Filled 2017-07-10 (×2): qty 2

## 2017-07-10 MED ORDER — ONDANSETRON HCL 4 MG/2ML IJ SOLN
4.0000 mg | Freq: Four times a day (QID) | INTRAMUSCULAR | Status: DC | PRN
  Administered 2017-07-10 – 2017-07-11 (×2): 4 mg via INTRAVENOUS
  Filled 2017-07-10 (×2): qty 2

## 2017-07-10 MED ORDER — ONDANSETRON 4 MG PO TBDP: 4.0000 mg | ORAL_TABLET | Freq: Four times a day (QID) | ORAL | Status: DC | PRN

## 2017-07-10 MED ORDER — AMLODIPINE BESYLATE 5 MG PO TABS
5.0000 mg | ORAL_TABLET | Freq: Every day | ORAL | Status: DC
  Administered 2017-07-13: 5 mg via ORAL
  Filled 2017-07-10: qty 1

## 2017-07-10 MED ORDER — HYDRALAZINE HCL 20 MG/ML IJ SOLN
10.0000 mg | INTRAMUSCULAR | Status: DC
  Administered 2017-07-10 – 2017-07-11 (×4): 10 mg via INTRAVENOUS
  Filled 2017-07-10 (×3): qty 1

## 2017-07-10 MED ORDER — NITROGLYCERIN 0.4 MG SL SUBL: 0.4000 mg | SUBLINGUAL_TABLET | SUBLINGUAL | Status: DC | PRN

## 2017-07-10 MED ORDER — INSULIN ASPART 100 UNIT/ML ~~LOC~~ SOLN
0.0000 [IU] | Freq: Three times a day (TID) | SUBCUTANEOUS | Status: DC
  Administered 2017-07-11: 1 [IU] via SUBCUTANEOUS
  Administered 2017-07-11 – 2017-07-15 (×5): 3 [IU] via SUBCUTANEOUS
  Administered 2017-07-15: 5 [IU] via SUBCUTANEOUS

## 2017-07-10 MED ORDER — PROMETHAZINE HCL 25 MG/ML IJ SOLN
INTRAMUSCULAR | Status: AC
  Administered 2017-07-10: 25 mg via INTRAVENOUS
  Filled 2017-07-10: qty 1

## 2017-07-10 MED ORDER — ONDANSETRON HCL 4 MG/2ML IJ SOLN: 4.0000 mg | Freq: Four times a day (QID) | INTRAMUSCULAR | Status: DC | PRN

## 2017-07-10 MED ORDER — LIDOCAINE-PRILOCAINE 2.5-2.5 % EX CREA: 1.0000 "application " | TOPICAL_CREAM | CUTANEOUS | Status: DC | PRN

## 2017-07-10 MED ORDER — HYDRALAZINE HCL 20 MG/ML IJ SOLN
INTRAMUSCULAR | Status: AC
  Administered 2017-07-10: 10 mg via INTRAVENOUS
  Filled 2017-07-10: qty 1

## 2017-07-10 MED ORDER — PROMETHAZINE HCL 25 MG/ML IJ SOLN
12.5000 mg | Freq: Four times a day (QID) | INTRAMUSCULAR | Status: DC | PRN
  Administered 2017-07-10 – 2017-07-12 (×6): 25 mg via INTRAVENOUS
  Filled 2017-07-10 (×5): qty 1

## 2017-07-10 MED ORDER — SILDENAFIL CITRATE 20 MG PO TABS: 20.0000 mg | ORAL_TABLET | Freq: Three times a day (TID) | ORAL | Status: DC

## 2017-07-10 NOTE — Progress Notes (Signed)
See admission note

## 2017-07-10 NOTE — Progress Notes (Signed)
ANTICOAGULATION CONSULT NOTE - Initial Consult  Pharmacy Consult for Warfarin Indication: LVAD  Allergies  Allergen Reactions  . Metformin And Related Diarrhea    Patient Measurements: Height: 5' 8"  (172.7 cm) Weight: 179 lb 14.3 oz (81.6 kg) IBW/kg (Calculated) : 68.4  Vital Signs: Temp: 97.7 F (36.5 C) (11/05 1447) Temp Source: Oral (11/05 1447) BP: 132/81 (11/05 1600) Pulse Rate: 79 (11/05 1600)  Labs: Recent Labs    07/10/17 1107  HGB 9.1*  HCT 28.5*  PLT 307  LABPROT 47.3*  INR 5.09*  CREATININE 8.84*    Estimated Creatinine Clearance: 9.5 mL/min (A) (by C-G formula based on SCr of 8.84 mg/dL (H)).   Medical History: Past Medical History:  Diagnosis Date  . Angina   . ASCVD (arteriosclerotic cardiovascular disease)    , Anterior infarction 2005, LAD diagonal bifurcation intervention 03/2004  . Automatic implantable cardiac defibrillator -St. Jude's       . Benign neoplasm of colon   . CHF (congestive heart failure) ([place])   . Chronic systolic heart failure ([place])   . Coronary artery disease     Widely patent previously placed stents in the left anterior   . Crohn's disease ([place])   . 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 Dyspnea   . Gastroparesis   . GERD (gastroesophageal reflux disease)   . High cholesterol   . Hyperlipidemia   . Hypersomnolent    Previous diagnosis of narcolepsy  . Hypertension, essential   . Ischemic cardiomyopathy    Ejection fraction 15-20% catheterization 2010  . Type II or unspecified type diabetes mellitus without mention of complication, not stated as uncontrolled   . Unspecified gastritis and gastroduodenitis without mention of hemorrhage      Assessment: LVAD and CKD on HD admitted wirh HTN, N/V.  INR 5.09, LDH ok 255, CBC stable.     Goal of Therapy:  INR 2-3 Monitor platelets by anticoagulation protocol: Yes   Plan:  Hold Warfarin today Daily INR  Bonnita Nasuti Pharm.D. CPP,  BCPS Clinical Pharmacist [phone] 07/10/2017 4:28 PM     Nadine Counts 07/10/2017,4:25 PM

## 2017-07-10 NOTE — H&P (Signed)
Advanced Heart Failure VAD History and Physical Note   Reason for Admission: N/V, HTN   HPI:    [name] is a 51 y.o. male with history of CAD s/p anterior MI in 2010 and NSTEMI in 3/18 with DES to pRCA and mLAD and ischemic cardiomyopathy now s/p Heartmate 3 LVAD and ESRD presents for followup of CHF/LVAD.  Patient developed low output heart failure.  He was not deemed to be a good transplant candidate.  He was admitted in 10/18 and Heartmate 3 LVAD was placed.  He had baseline CKD stage 3 likely from diabetic nephropathy and CHF.  He developed peri-op AKI and ended up on dialysis.  No renal recovery to date.  St Jude ICD has been turned back on and ramp echo was done prior to discharge in 10/18, speed increased to 5600 rpm.   Seen in clinic 07/06/17 [facility]. BP noted to be running high with MAP 97. Hydralazine increased.   Pt presented today as add on due to N/V beginning yesterday morning. Unable to take medications since yesterday.  Dry heaving currently. MAP > 100. He is due dialysis today. Had been feeling good overall until yesterday. Denies fevers or chills. Plan to admit for further evaluation and treatment. No BRBPR/melena.   LVAD INTERROGATION:  HeartMate II LVAD:  Flow 3.8 liters/min, speed 5600, power 4.2, PI 5.7.  No PI events.   Hgb 9.1   INR 5.09  Review of Systems: [y] = yes, [ ]  = no   General: Weight gain [ ] ; Weight loss [ ] ; Anorexia [ ] ; Fatigue [y]; Fever [ ] ; Chills [ ] ; Weakness [y]  Cardiac: Chest pain/pressure [ ] ; Resting SOB [ ] ; Exertional SOB [y]; Orthopnea [ ] ; Pedal Edema [y]; Palpitations [ ] ; Syncope [ ] ; Presyncope [ ] ; Paroxysmal nocturnal dyspnea[ ]   Pulmonary: Cough [ ] ; Wheezing[ ] ; Hemoptysis[ ] ; Sputum [ ] ; Snoring [ ]   GI: Vomiting [y]; Dysphagia[ ] ; Melena[ ] ; Hematochezia [ ] ; Heartburn[ ] ; Abdominal pain [y]; Constipation [ ] ; Diarrhea [ ] ; BRBPR [ ]   GU: Hematuria[ ] ; Dysuria [ ] ; Nocturia[ ]   Vascular: Pain in  legs with walking [ ] ; Pain in feet with lying flat [ ] ; Non-healing sores [ ] ; Stroke [ ] ; TIA [ ] ; Slurred speech [ ] ;  Neuro: Headaches[ ] ; Vertigo[ ] ; Seizures[ ] ; Paresthesias[ ] ;Blurred vision [ ] ; Diplopia [ ] ; Vision changes [ ]   Ortho/Skin: Arthritis [y]; Joint pain [y]; Muscle pain [ ] ; Joint swelling [ ] ; Back Pain [ ] ; Rash [ ]   Psych: Depression[ ] ; Anxiety[ ]   Heme: Bleeding problems [ ] ; Clotting disorders [ ] ; Anemia [ ]   Endocrine: Diabetes [ ] ; Thyroid dysfunction[ ]     Home Medications Prior to Admission medications   Medication Sig Start Date End Date Taking? Authorizing Provider  amiodarone (PACERONE) 200 MG tablet Take 1 tablet (200 mg total) by mouth daily. 07/03/17   Clegg, Amy D, NP  aspirin EC 325 MG EC tablet Take 1 tablet (325 mg total) by mouth daily. 07/03/17   Clegg, Amy D, NP  atorvastatin (LIPITOR) 80 MG tablet Take 1 tablet (80 mg total) by mouth daily at 6 PM. 11/28/16   Tillery, Satira Mccallum, PA-C  docusate sodium (COLACE) 100 MG capsule Take 2 capsules (200 mg total) by mouth daily. 07/03/17   Clegg, Amy D, NP  hydrALAZINE (APRESOLINE) 25 MG tablet Take 1.5 tablets (37.5 mg total) by mouth 3 (three) times daily. Take 37.5  mg three times a day on non hemodialysis days and 25 mg the morning of dialysis and before bed on dialysis days. 07/06/17   Larey Dresser, MD  insulin aspart (NOVOLOG) 100 UNIT/ML injection Inject 3 Units into the skin 3 (three) times daily with meals. 07/03/17   Clegg, Amy D, NP  insulin glargine (LANTUS) 100 UNIT/ML injection Inject 0.05 mLs (5 Units total) into the skin daily. 07/03/17   Clegg, Amy D, NP  metoCLOPramide (REGLAN) 5 MG tablet Take one tablet 1/2 hr before meals. Patient taking differently: Take by mouth 3 (three) times daily before meals.  01/10/17   Esterwood, Amy S, PA-C  nitroGLYCERIN (NITROSTAT) 0.4 MG SL tablet Place 1 tablet (0.4 mg total) under the tongue every 5 (five) minutes as needed for chest pain. 07/13/16    Jerline Pain, MD  ondansetron (ZOFRAN ODT) 4 MG disintegrating tablet Take 1 tablet (4 mg total) by mouth every 6 (six) hours as needed for nausea or vomiting. 01/10/17   Esterwood, Amy S, PA-C  pantoprazole (PROTONIX) 40 MG tablet Take 40 mg by mouth daily.    [provider]  sildenafil (REVATIO) 20 MG tablet Take 1 tablet (20 mg total) by mouth 3 (three) times daily. 07/03/17   Clegg, Amy D, NP  warfarin (COUMADIN) 5 MG tablet Take 5 mg (1 tablet) daily except 7.5 mg (1 and 1/2 tablets) on Mon/Wed.    [provider]    Past Medical History: 1. Chronic systolic CHF: Ischemic cardiomyopathy. St Jude ICD.  - Echo (11/17) with EF 25-30%, moderate diastolic dysfunction, mild MR, mildly dilated RV with moderately decreased systolic function, peak RV-RA gradient 48 mmHg.  - Echo (3/18) with EF 25-30%, wall motion abnormalities, normal RV size and systolic function.  - Admission 3/18 with cardiogenic shock and AKI, required milrinone gtt.  - Echo (8/18) with EF 20-25%, mild LVH, moderate LV dilation, mild MR.  - CPX (8/18) with peak VO2 12.4, VE/VCO2 slope 35, RER 1.05 =>severe HR limitation.  - Heartmate 3 LVAD 10/18.  2. CAD: Anterior MI 2005, had bifurcation stenting LAD/diagonal. Repeat cath 2010 with patent stents.  - NSTEMI 3/18: LHC with 99% ulcerated lesion proximal RCA, 95% mid LAD =>PCI with DES to proximal RCA and mid LAD.  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  - Lower extremity venous dopplers (8/18): chronic DVT - V/Q scan (8/18): Normal, no PE.  4. Crohns disease: History of colectomy.  5. GERD 6. Gastroparesis: Due to diabetes 7. HTN 8. Hyperlipidemia 9. Type II diabetes  10. ESRD: Post-op LVAD surgery.  11. Carotid dopplers (8/18): mild bilateral stenosis.  12. PFTs (8/18): Mild restriction (no IDL on CT chest).  13. Lung nodules: CT chest 8/18 with small bilateral nodules.  14. PAD: ABIs with moderate bilateral disease in 8/18.   Past Surgical  History: Past Surgical History:  Procedure Laterality Date  . APPENDECTOMY    . CARDIAC DEFIBRILLATOR PLACEMENT  2010   St. Jude ICD  . COLECTOMY     "for Crohn's"  . COLON SURGERY    . FETAL SURGERY FOR CONGENITAL HERNIA    . INGUINAL HERNIA REPAIR    . IR THORACENTESIS ASP PLEURAL SPACE W/IMG GUIDE  06/28/2017    Family History: Family History  Problem Relation Age of Onset  . Colon polyps Mother   . Diabetes Mother   . Heart attack Father   . Diabetes Father   . Stroke Paternal Grandfather   . Colitis Sister   .  Heart disease Brother   . Colitis Sister   . Colon cancer Neg Hx     Social History: Social History   Socioeconomic History  . Marital status: Married    Spouse name: Not on file  . Number of children: Not on file  . Years of education: Not on file  . Highest education level: Not on file  Social Needs  . Financial resource strain: Not on file  . Food insecurity - worry: Not on file  . Food insecurity - inability: Not on file  . Transportation needs - medical: Not on file  . Transportation needs - non-medical: Not on file  Occupational History  . Not on file  Tobacco Use  . Smoking status: Never Smoker  . Smokeless tobacco: Never Used  Substance and Sexual Activity  . Alcohol use: No  . Drug use: No  . Sexual activity: No  Other Topics Concern  . Not on file  Social History Narrative  . Not on file    Allergies:  Allergies  Allergen Reactions  . Metformin And Related Diarrhea    Objective:    Vital Signs:   Auto BP 142/104 HR 73 O2 sat: 99%  Mean arterial Pressure 100s-110s  Physical Exam    General:  Fatigued and ill appearing. Active dry heaving. No resp difficulty HEENT: Normal Neck: supple. JVP 8-9 cm. Carotids 2+ bilat; no bruits. No lymphadenopathy or thyromegaly appreciated. Cor: Mechanical heart sounds with LVAD hum present. Lungs: Clear Abdomen: soft, mildly tender diffusely, nondistended. No hepatosplenomegaly. No  bruits or masses. Good bowel sounds. Driveline: C/D/I; securement device intact.  Extremities: no cyanosis, clubbing, or rash. Trace to 1+ ankle edema.  Neuro: alert & orientedx3, cranial nerves grossly intact. moves all 4 extremities w/o difficulty. Affect distressed with active dry heaving.    Telemetry   Not yet connected.  EKG   N/A  Labs    Basic Metabolic Panel: No results for input(s): NA, K, CL, CO2, GLUCOSE, BUN, CREATININE, CALCIUM, MG, PHOS in the last 168 hours.  Liver Function Tests: No results for input(s): AST, ALT, ALKPHOS, BILITOT, PROT, ALBUMIN in the last 168 hours. No results for input(s): LIPASE, AMYLASE in the last 168 hours. No results for input(s): AMMONIA in the last 168 hours.  CBC: Recent Labs  Lab 07/06/17 1134 07/10/17 1107  WBC 13.7* 10.1  HGB 10.5* 9.1*  HCT 32.6* 28.5*  MCV 81.9 79.8  PLT 373 307    Cardiac Enzymes: No results for input(s): CKTOTAL, CKMB, CKMBINDEX, TROPONINI in the last 168 hours.  BNP: BNP (last 3 results) Recent Labs    06/18/17 2305 06/26/17 0004 07/03/17 0037  BNP 746.0* 1,409.3* 758.9*    ProBNP (last 3 results) No results for input(s): PROBNP in the last 8760 hours.   CBG: No results for input(s): GLUCAP in the last 168 hours.  Coagulation Studies: No results for input(s): LABPROT, INR in the last 72 hours.  Imaging     No results found.  Patient Profile:   [name] is a 51 y.o. male with history of CAD s/p anterior MI in 2010 and NSTEMI in 3/18 with DES to pRCA and mLAD and ischemic cardiomyopathy now s/p Heartmate 3 LVAD and ESRD.   Admitted from clinic 07/10/17 with N/V with active vomiting and dry heaves as well as HTN.   Assessment/Plan:    1. Nausea/vomiting: Patient has a prior history of this when volume overloaded though weight is only up 1 lb since  11/1.  He also has a history of diabetic gastroparesis which has led to severe GI symptoms in the past.  LVAD output is  compromised as well by markedly elevated MAP.  - Hold po meds for now until nausea/vomiting resolves.  - Will give IV hydralazine as below.  - Will give IV Reglan and Zofran - Will check CT Abdomen/pelvis without contrast give acute onset.  2. HTN: MAP markedly elevated today, this has been complicated to control given HD.  However, needs good control for LVAD function.  - Will give IV hydral 10 mg q 4 hrs for now, scheduled until he can take po. Will resume previous po dose (Recently increased) as tolerated (hydralazine 37.5 mg tid).  - He is likely going to need an additional medication.  Will start amlodipine 5 mg daily (rather than ARB or Entresto as still slight hope for renal recovery).  3. ESRD: Spoke with Dr. Posey Pronto, dialysis will be arranged for today.  4. Chronic systolic CHF: Ischemic cardiomyopathy. St Jude ICD. NSTEMI in March 2018 with DES to LAD and RCA, complicated by cardiogenic shock, low output requiring milrinone. Echo 8/18 with EF 20-25%, moderate MR. PX (8/18) with severe functional impairment due to HF. He is now s/p HM3 LVAD placement. - LVAD parameters reviewed today and stable despite marked BP increase.   - MAP remains elevated as above.  - Given heterozygosity for factor V Leiden and history of clotting, will continue ASA at 325 mg daily and aim for coumadin with INR 2-3. INR 5 today, hold warfarin.   - Continue sildenafil for RV support when he can take po.  5. CAD: NSTEMI in 3/18. LHC with 99% ulcerated lesion proximal RCA with left to right collaterals, 95% mid LAD stenosis after mid LAD stent. s/p PCI to RCA and LAD on 11/25/16.  - No s/s of ischemia.  - Continue statin and ASA. 6. h/o DVTs: On warfarin. Dosing per pharm.  7. Diabetic gastroparesis: Suspect this contributes to symptoms.   8. DM: Sliding scale for now.  9. PAD: Moderate bilateral disease on ABIs in 8/18.  Denies claudication currently. No change.   I reviewed the LVAD parameters from today,  and compared the results to the patient's prior recorded data.  No programming changes were made.  The LVAD is functioning within specified parameters.  The patient performs LVAD self-test daily.  LVAD interrogation was negative for any significant power changes, alarms or PI events/speed drops.  LVAD equipment check completed and is in good working order.  Back-up equipment present.   LVAD education done on emergency procedures and precautions and reviewed exit site care.  Length of Stay: 0  Annamaria Helling 07/10/2017, 11:41 AM  VAD Team Pager [phone] (7am - 7am) +++VAD ISSUES ONLY+++  Advanced Heart Failure Team Pager [phone] (M-F; [place])  Please contact [facility] for night-coverage after hours (4p -7a ) and weekends on [email or web address] for all non- LVAD Issues  Patient seen with PA, agree with the above note. I made changes to the above not to reflect my thoughts.    Patient developed nausea/vomiting yesterday morning, had been doing fine prior.  Was doing well when seen in his office on 11/1.  Mild abdominal tenderness but may be due to vomiting.  MAP is quite elevated, he has not been able to keep any pills down.    On exam, he has mild volume overload.  Mild diffuse abdominal tenderness as above.  Normal LVAD sounds.  Nausea and vomiting may be related to gastroparesis (this has been symptomatic in the past).  He has also developed nausea/vomiting with volume overload in the past though weight only up 1 lb compared to appt last week.  Finally, high MAP will impair function of his LVAD though parameters are actually fairly stable compared to the past.   - CT abdomen/pelvis w/o contrast given relatively sudden onset. - Reglan + Zofran IV.  - Needs better BP control => IV hydralazine scheduled while he cannot keep down pills, then resume hydralazine and add amlodipine.   - He will need HD with volume removal today.   Doy Taaffe 07/10/2017 1:08 PM

## 2017-07-10 NOTE — Procedures (Signed)
Patient seen on Hemodialysis. QB 400, UF goal 3L Treatment adjusted as needed.  Elmarie Shiley MD [facility]. Office # [phone] Pager # [phone] 3:03 PM

## 2017-07-10 NOTE — Progress Notes (Signed)
Zofran 4 mg IV given in LVAD clinic.  Balinda Quails RN, VAD Coordinator 24/7 pager [phone]

## 2017-07-10 NOTE — Consult Note (Signed)
[facility] Renal Consultation Note    Indication for Consultation:  Management of ESRD/hemodialysis, anemia, hypertension/volume, and secondary hyperparathyroidism. PCP:  HPI: [name] is a 51 y.o. male with new ESRD, severe CAD and ischemic cardiomyopathy (s/p LVAD 06/2017), Crohn's disease, GERD, Type 2 DM who is being admitted for N/V and abdominal pain.  [name] reports that the abdominal symptoms and N/V started yesterday. He was unable to keep his medications down. He presented to the LVAD clinic and was sent to [facility] for admission. Labs show K 4.5, Hgb 9.1, WBC 10.1, INR 5.09. He has not undergone imaging yet. The cause of his abdominal symptoms is not completely clear, ?viral v. Crohn's flare v. gastroparesis v. other etiology. No known sick contacts. Currently, BP remains high. He is afebrile. Denies CP or dyspnea, but feels incredibly nauseated. Given Zofran without much effect so far, phenergan ordered and will be given soon. Denies headache or dizziness.  He is due for dialysis today, last dialyzed Friday 11/2. Just started HD outpatient, has only had 2 outpt sessions. His HD scheduled is MWF at [facility]. He has a maturing RUE AVF, but currently using TDC as his access.  Past Medical History:  Diagnosis Date  . Angina   . ASCVD (arteriosclerotic cardiovascular disease)    , Anterior infarction 2005, LAD diagonal bifurcation intervention 03/2004  . Automatic implantable cardiac defibrillator -[employer]       . Benign neoplasm of colon   . CHF (congestive heart failure) ([place])   . Chronic systolic heart failure ([place])   . Coronary artery disease     Widely patent previously placed stents in the left anterior   . Crohn's disease ([place])   . 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 Dyspnea   . Gastroparesis   . GERD (gastroesophageal reflux disease)   . High cholesterol   . Hyperlipidemia   . Hypersomnolent    Previous diagnosis of  narcolepsy  . Hypertension, essential   . Ischemic cardiomyopathy    Ejection fraction 15-20% catheterization 2010  . Type II or unspecified type diabetes mellitus without mention of complication, not stated as uncontrolled   . Unspecified gastritis and gastroduodenitis without mention of hemorrhage    Past Surgical History:  Procedure Laterality Date  . APPENDECTOMY    . CARDIAC DEFIBRILLATOR PLACEMENT  2010   [employer] Jude ICD  . COLECTOMY     "for Crohn's"  . COLON SURGERY    . FETAL SURGERY FOR CONGENITAL HERNIA    . INGUINAL HERNIA REPAIR    . IR THORACENTESIS ASP PLEURAL SPACE W/IMG GUIDE  06/28/2017   Family History  Problem Relation Age of Onset  . Colon polyps Mother   . Diabetes Mother   . Heart attack Father   . Diabetes Father   . Stroke Paternal Grandfather   . Colitis Sister   . Heart disease Brother   . Colitis Sister   . Colon cancer Neg Hx    Social History:  reports that  has never smoked. he has never used smokeless tobacco. He reports that he does not drink alcohol or use drugs.  ROS: As per HPI otherwise negative.  Physical Exam: Vitals:   07/10/17 1317  BP: (!) 139/99  Pulse: 75  Resp: 12  Temp: 98.1 F (36.7 C)  TempSrc: Oral  SpO2: 99%  Weight: 84.1 kg (185 lb 6.4 oz)  Height: 5' 8"  (1.727 m)     General:  Well developed, well nourished, but looks uncomfortable and ill. Head: Normocephalic, atraumatic, sclera non-icteric, mucus membranes are moist. Neck: Supple without lymphadenopathy/masses. JVD not elevated. Lungs: Clear bilaterally to auscultation without wheezes, rales, or rhonchi. Breathing is unlabored. Heart: RRR with normal S1, S2. No murmurs, rubs, or gallops appreciated. Abdomen: Soft, non-distended with normoactive bowel sounds. Mild generalized tenderness without rebound/guarding.  Musculoskeletal:  Strength and tone appear normal for age. Lower extremities: No edema or ischemic changes, no open wounds. Neuro: Alert and oriented X  3. Moves all extremities spontaneously. Psych:  Responds to questions appropriately with a normal affect. Dialysis Access: TDC in R chest, maturing RUE AVF + thrill  Allergies  Allergen Reactions  . Metformin And Related Diarrhea   Prior to Admission medications   Medication Sig Start Date End Date Taking? Authorizing Provider  amiodarone (PACERONE) 200 MG tablet Take 1 tablet (200 mg total) by mouth daily. 07/03/17  Yes Clegg, Amy D, NP  aspirin EC 325 MG EC tablet Take 1 tablet (325 mg total) by mouth daily. 07/03/17  Yes Clegg, Amy D, NP  atorvastatin (LIPITOR) 80 MG tablet Take 1 tablet (80 mg total) by mouth daily at 6 PM. 11/28/16  Yes Tillery, Satira Mccallum, PA-C  docusate sodium (COLACE) 100 MG capsule Take 2 capsules (200 mg total) by mouth daily. Patient taking differently: Take 200 mg daily as needed by mouth for mild constipation.  07/03/17  Yes Clegg, Amy D, NP  hydrALAZINE (APRESOLINE) 25 MG tablet Take 1.5 tablets (37.5 mg total) by mouth 3 (three) times daily. Take 37.5 mg three times a day on non hemodialysis days and 25 mg the morning of dialysis and before bed on dialysis days. 07/06/17  Yes Larey Dresser, MD  insulin aspart (NOVOLOG) 100 UNIT/ML injection Inject 3 Units into the skin 3 (three) times daily with meals. 07/03/17  Yes Clegg, Amy D, NP  insulin glargine (LANTUS) 100 UNIT/ML injection Inject 0.05 mLs (5 Units total) into the skin daily. 07/03/17  Yes Clegg, Amy D, NP  metoCLOPramide (REGLAN) 5 MG tablet Take one tablet 1/2 hr before meals. Patient taking differently: Take by mouth 3 (three) times daily before meals.  01/10/17  Yes Esterwood, Amy S, PA-C  nitroGLYCERIN (NITROSTAT) 0.4 MG SL tablet Place 1 tablet (0.4 mg total) under the tongue every 5 (five) minutes as needed for chest pain. 07/13/16  Yes Jerline Pain, MD  ondansetron (ZOFRAN ODT) 4 MG disintegrating tablet Take 1 tablet (4 mg total) by mouth every 6 (six) hours as needed for nausea or vomiting.  01/10/17  Yes Esterwood, Amy S, PA-C  pantoprazole (PROTONIX) 40 MG tablet Take 40 mg by mouth daily.   Yes [provider]  sildenafil (REVATIO) 20 MG tablet Take 1 tablet (20 mg total) by mouth 3 (three) times daily. Patient taking differently: Take 20 mg 3 (three) times daily as needed by mouth (erectile dysfunction).  07/03/17  Yes Clegg, Amy D, NP  warfarin (COUMADIN) 5 MG tablet Take 5-7.5 mg See admin instructions by mouth. Take 5 mg (1 tablet) daily except 7.5 mg (1 and 1/2 tablets) on Mon/Wed.    Yes [provider]   Current Facility-Administered Medications  Medication Dose Route Frequency Provider Last Rate Last Dose  . 0.9 %  sodium chloride infusion  100 mL Intravenous PRN Loren Racer, PA-C      . 0.9 %  sodium chloride infusion  100 mL Intravenous PRN Loren Racer, PA-C      .  acetaminophen (TYLENOL) tablet 650 mg  650 mg Oral Q4H PRN Shirley Friar, PA-C      . alteplase (CATHFLO ACTIVASE) injection 2 mg  2 mg Intracatheter Once PRN Loren Racer, PA-C      . amiodarone (PACERONE) tablet 200 mg  200 mg Oral Daily Shirley Friar, PA-C      . aspirin EC tablet 325 mg  325 mg Oral Daily Shirley Friar, PA-C      . atorvastatin (LIPITOR) tablet 80 mg  80 mg Oral q1800 Shirley Friar, PA-C      . docusate sodium (COLACE) capsule 200 mg  200 mg Oral Daily Shirley Friar, PA-C      . heparin injection 1,000 Units  1,000 Units Dialysis PRN Loren Racer, PA-C      . hydrALAZINE (APRESOLINE) injection 10 mg  10 mg Intravenous Q4H PRN Shirley Friar, PA-C      . hydrALAZINE (APRESOLINE) injection 10 mg  10 mg Intravenous Q4H Shirley Friar, PA-C   10 mg at 07/10/17 1317  . insulin aspart (novoLOG) injection 0-15 Units  0-15 Units Subcutaneous TID WC Shirley Friar, PA-C      . insulin aspart (novoLOG) injection 0-5 Units  0-5 Units Subcutaneous QHS Shirley Friar, PA-C       . lidocaine (PF) (XYLOCAINE) 1 % injection 5 mL  5 mL Intradermal PRN Loren Racer, PA-C      . lidocaine-prilocaine (EMLA) cream 1 application  1 application Topical PRN Loren Racer, PA-C      . metoCLOPramide (REGLAN) injection 5 mg  5 mg Intravenous Q12H Shirley Friar, PA-C      . nitroGLYCERIN (NITROSTAT) SL tablet 0.4 mg  0.4 mg Sublingual Q5 min PRN Shirley Friar, PA-C      . ondansetron [facility]) injection 4 mg  4 mg Intravenous Q6H PRN Shirley Friar, PA-C   4 mg at 07/10/17 1334  . ondansetron (ZOFRAN-ODT) disintegrating tablet 4 mg  4 mg Oral Q6H PRN Shirley Friar, PA-C      . pantoprazole (PROTONIX) EC tablet 40 mg  40 mg Oral Daily Shirley Friar, PA-C      . pentafluoroprop-tetrafluoroeth (GEBAUERS) aerosol 1 application  1 application Topical PRN Loren Racer, PA-C      . promethazine (PHENERGAN) injection 12.5-25 mg  12.5-25 mg Intravenous Q6H PRN Larey Dresser, MD       Labs: Basic Metabolic Panel: Recent Labs  Lab 07/10/17 1107  NA 133*  K 4.5  CL 96*  CO2 22  GLUCOSE 220*  BUN 49*  CREATININE 8.84*  CALCIUM 8.6*   CBC: Recent Labs  Lab 07/06/17 1134 07/10/17 1107  WBC 13.7* 10.1  HGB 10.5* 9.1*  HCT 32.6* 28.5*  MCV 81.9 79.8  PLT 373 307   CBG: Recent Labs  Lab 07/10/17 1329  GLUCAP 201*   Dialysis Orders:  MWF at [facility] 4:15hr, BFR 400, DFR 800, EDW 85kg, 3K/2Ca, TDC, Heparin 8000 bolus - Venofer 172m x 10 ordered ( 2 given so far) - Mircera 1071m IV q 2 weeks ordered (not given yet) - Calcitriol 0.70m68mPO q HD  Assessment/Plan: 1.  Nausea/vomiting: Work-up per primary. S/p zofran and getting phenergan now. 2.  ESRD: Will continue HD per MWF schedule, for HD today. 2.5L UF as tolerated. 3.  Hypertension/volume: BP high, UF as above. No edema. Below his prior EDW, will need adjustment. 4.  Anemia:  Hgb 9.1. Will start ESA today (Aranesp 142mg) 5.  Metabolic bone disease: Ca  ok, Phos pending. Monitor for now. 6. Severe ischemic cardiomyopathy (s/p LVAD): Per cardiology. 7. Type 2 DM 8. Crohn's Disease  KVeneta Penton PHershal Coria11/01/2017, 2:53 PM  [facility] Pager: [phone]

## 2017-07-11 ENCOUNTER — Other Ambulatory Visit: Payer: Self-pay

## 2017-07-11 ENCOUNTER — Encounter (HOSPITAL_COMMUNITY): Payer: Self-pay

## 2017-07-11 ENCOUNTER — Inpatient Hospital Stay (HOSPITAL_COMMUNITY): Payer: BLUE CROSS/BLUE SHIELD

## 2017-07-11 ENCOUNTER — Encounter: Payer: Self-pay | Admitting: Cardiology

## 2017-07-11 DIAGNOSIS — Z7901 Long term (current) use of anticoagulants: Secondary | ICD-10-CM

## 2017-07-11 DIAGNOSIS — R791 Abnormal coagulation profile: Secondary | ICD-10-CM

## 2017-07-11 LAB — PROTIME-INR
INR: 5.47 — AB
Prothrombin Time: 49.4 seconds — ABNORMAL HIGH (ref 11.4–15.2)

## 2017-07-11 LAB — GLUCOSE, CAPILLARY
GLUCOSE-CAPILLARY: 136 mg/dL — AB (ref 65–99)
Glucose-Capillary: 159 mg/dL — ABNORMAL HIGH (ref 65–99)
Glucose-Capillary: 122 mg/dL — ABNORMAL HIGH (ref 65–99)
Glucose-Capillary: 128 mg/dL — ABNORMAL HIGH (ref 65–99)

## 2017-07-11 LAB — CBC
HCT: 31.5 % — ABNORMAL LOW (ref 39.0–52.0)
Hemoglobin: 9.9 g/dL — ABNORMAL LOW (ref 13.0–17.0)
MCH: 25.4 pg — AB (ref 26.0–34.0)
MCHC: 31.4 g/dL (ref 30.0–36.0)
MCV: 80.8 fL (ref 78.0–100.0)
PLATELETS: 355 10*3/uL (ref 150–400)
RBC: 3.9 MIL/uL — ABNORMAL LOW (ref 4.22–5.81)
RDW: 17.2 % — ABNORMAL HIGH (ref 11.5–15.5)
WBC: 11.8 10*3/uL — ABNORMAL HIGH (ref 4.0–10.5)

## 2017-07-11 LAB — RENAL FUNCTION PANEL
Albumin: 2.6 g/dL — ABNORMAL LOW (ref 3.5–5.0)
Anion gap: 16 — ABNORMAL HIGH (ref 5–15)
BUN: 23 mg/dL — ABNORMAL HIGH (ref 6–20)
CALCIUM: 8.6 mg/dL — AB (ref 8.9–10.3)
CHLORIDE: 94 mmol/L — AB (ref 101–111)
CO2: 22 mmol/L (ref 22–32)
CREATININE: 5.6 mg/dL — AB (ref 0.61–1.24)
GFR calc Af Amer: 12 mL/min — ABNORMAL LOW (ref >60)
GFR calc non Af Amer: 11 mL/min — ABNORMAL LOW (ref >60)
GLUCOSE: 156 mg/dL — AB (ref 65–99)
Phosphorus: 5.3 mg/dL — ABNORMAL HIGH (ref 2.5–4.6)
Potassium: 4.7 mmol/L (ref 3.5–5.1)
SODIUM: 132 mmol/L — AB (ref 135–145)

## 2017-07-11 LAB — AMYLASE: Amylase: 141 U/L — ABNORMAL HIGH (ref 28–100)

## 2017-07-11 LAB — LACTATE DEHYDROGENASE: LDH: 383 U/L — ABNORMAL HIGH (ref 98–192)

## 2017-07-11 MED ORDER — HYDRALAZINE HCL 20 MG/ML IJ SOLN
20.0000 mg | INTRAMUSCULAR | Status: DC
  Administered 2017-07-11 – 2017-07-14 (×15): 20 mg via INTRAVENOUS
  Filled 2017-07-11 (×14): qty 1

## 2017-07-11 MED ORDER — AMIODARONE LOAD VIA INFUSION
150.0000 mg | Freq: Once | INTRAVENOUS | Status: AC
  Administered 2017-07-11: 150 mg via INTRAVENOUS
  Filled 2017-07-11: qty 83.34

## 2017-07-11 MED ORDER — PROMETHAZINE HCL 25 MG/ML IJ SOLN
12.5000 mg | Freq: Three times a day (TID) | INTRAMUSCULAR | Status: DC
  Administered 2017-07-11 – 2017-07-14 (×8): 12.5 mg via INTRAVENOUS
  Filled 2017-07-11 (×10): qty 1

## 2017-07-11 MED ORDER — IOPAMIDOL (ISOVUE-300) INJECTION 61%
INTRAVENOUS | Status: AC
  Filled 2017-07-11: qty 30

## 2017-07-11 MED ORDER — SILDENAFIL CITRATE 20 MG PO TABS: 20.0000 mg | ORAL_TABLET | Freq: Three times a day (TID) | ORAL | Status: DC

## 2017-07-11 MED ORDER — AMIODARONE HCL IN DEXTROSE 360-4.14 MG/200ML-% IV SOLN
30.0000 mg/h | INTRAVENOUS | Status: DC
  Administered 2017-07-11 – 2017-07-12 (×2): 30 mg/h via INTRAVENOUS
  Filled 2017-07-11 (×5): qty 200

## 2017-07-11 MED ORDER — AMIODARONE HCL IN DEXTROSE 360-4.14 MG/200ML-% IV SOLN
60.0000 mg/h | INTRAVENOUS | Status: AC
  Administered 2017-07-11 (×2): 60 mg/h via INTRAVENOUS
  Filled 2017-07-11: qty 200

## 2017-07-11 MED ORDER — AMIODARONE HCL IN DEXTROSE 360-4.14 MG/200ML-% IV SOLN
INTRAVENOUS | Status: AC
  Filled 2017-07-11: qty 200

## 2017-07-11 MED ORDER — METOCLOPRAMIDE HCL 5 MG/ML IJ SOLN
5.0000 mg | Freq: Four times a day (QID) | INTRAMUSCULAR | Status: DC
  Administered 2017-07-11 – 2017-07-12 (×3): 5 mg via INTRAVENOUS
  Filled 2017-07-11 (×3): qty 2

## 2017-07-11 NOTE — Progress Notes (Signed)
Pt doppler recheck 122. VAD coordinator paged, advised to continue to monitor and pass information to day shift team and she will round on pt in the morning. Will continue to monitor.

## 2017-07-11 NOTE — Progress Notes (Signed)
Gave pt 12.5 phenergan. Pt still had nausea so gave another 12.5 iv for a total of 64m.  At this time pt is unable to drink stuff of CT scan.  Will try again.  Will continue to monitor. CSaunders RevelT

## 2017-07-11 NOTE — Consult Note (Signed)
[facility] Consult: 8:24 AM 07/11/2017  LOS: 1 day    Referring Provider: Dr Aundra Dubin  Primary Care Physician:  [name] Bussing, MD Primary Gastroenterologist:  Dr. [name] >> [name] is designated GI.     Reason for Consultation:  Nausea vomiting in diabetic   HPI: [name] is a 51 y.o. male. Type 2 DM, longstanding.  CAD.  MIs in 2005, with stenting.  CHF/cardiogenic shock, Non-STEMI leading to cardiac DES 11/2016.  Plavix added (but since stopped) to Coumadin then.  S/p ICD 2010.  S/p 06/12/17 LVAD per Dr Prescott Gum.   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  Factor V Leiden.   Pleural effusion, left thoracentesis 06/28/17.  Multiple tooth extractions 06/2017.  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  Lung nodules.  ADHD.  Narcolepsy.    Right inguinal hernia repair age 38. Gangrenous appendicitis with perforation, abscess, peritonitis, s/p ascending colectomy/appendectomy diverting ileostomy 02/2002. Ileostomy reversed 06/2002, post op course prolonged by N/V.   Pt has no knowledge of "fetal surgery for congenital hernia"   2002 Colonoscopy: inflammatory ano-rectal polyps c/w anal prolapse. Serologic markers positive for Crohns, so possible rectal Crohn's   2009 Colonoscopy for IDA, FOBT +, diarrhea.  Previous right colectomy.  Patent anastomosis.  Left sided tics.  Inflammatory rectal polyps, path: mucosal prolapse with focal  ulceration. 2009  EGD.  Nodular gastritis in cardia, antrum.  Biopsy: minor chronic gastritis, no villous atrophy or changes of sprue. 11/2011 GES: Delayed emptying. 85% retention at 60 minutes.  72% retention at 120 minutes. 04/28/17 CT ab/pelvis pre VAD:  Esophagus, biliary tract, GB, liver, pancreas unremarkable.   Intermittent N/V for years.  Up to 5  episodes non-bloody emesis in 24 hours.  On chronic Protonix, no reflux or heartburn sxs.  Frequent sense of abdominal fullness.  PO Zofran not helpful.  Long standing spells of loose stools but no blood or melena.  Reglan used in past but not clear if helpful; trial of 5 mg before meals along with PRN Zofran and step 3 Gastroparesis diet per [name] on 01/10/17.  Patient not sure if he was using the metoclopramide recently, it is not listed on his outpatient med list.  PRN Zofran and daily Protonix  are on the list. During the admission of 06/07/17 -07/04/17 for heart failure and LVAD, he was transfused with multiple blood products including 4 units PRBCs, platelets, and FFP.    Admitted yesterday with N/V, unable to take po meds.   This began Sunday morning out of the blue. Up until then he says he been tolerating his meals just fine. This spell of green/bilious, nonbloody emesis lasted a lot longer than normal so the heart failure team admitted him to the hospital.  Up until then he says he been tolerating his meals just fine.  He been having regular brown stools, the last of these was yesterday morning. Alk phos 135, elevations beginning 3 weeks ago, 120-130s.  All other LFTs and not currently elevated but transaminases to max 83/65  on 10/5, normalized within 6 days, . Latest Lipase was normal on 8/3, not checked since.   Anemic, MCV ~ 80.  Hgb ranging 8 to 10 in last 3 months.  7.7 on 10/18, transfused 1 U PRBC then.   Hgb A1c of 10.1 on 10/3.  Serum glucose ranging 80s to 336 but generally in 120s to 220s.   PT/INR 47/5 yesterday.   Hepatitis serologies negative in 04/2017.   CT ab/pelvis repeated: It has not been read and there is no report yet  Patient denies any new medications.  He is been tolerating his MWF boluses sessions without incidence, though he is only had 2 outpatient sessions since discharge from heart failure/LVAD admission 07/04/17.  He denies abdominal pain.  Does not have  problems with reflux symptoms or dysphagia.  No bloody or melenic stools.  No constipation.  No FHx colon cancer or ulcer disease.[name]   Past Medical History:  Diagnosis Date  . Angina   . ASCVD (arteriosclerotic cardiovascular disease)    , Anterior infarction 2005, LAD diagonal bifurcation intervention 03/2004  . Automatic implantable cardiac defibrillator -St. Jude's       . Benign neoplasm of colon   . CHF (congestive heart failure) ([place])   . Chronic systolic heart failure ([place])   . Coronary artery disease     Widely patent previously placed stents in the left anterior   . Crohn's disease ([place])   . 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 Dyspnea   . Gastroparesis   . GERD (gastroesophageal reflux disease)   . High cholesterol   . Hyperlipidemia   . Hypersomnolent    Previous diagnosis of narcolepsy  . Hypertension, essential   . Ischemic cardiomyopathy    Ejection fraction 15-20% catheterization 2010  . Type II or unspecified type diabetes mellitus without mention of complication, not stated as uncontrolled   . Unspecified gastritis and gastroduodenitis without mention of hemorrhage     Past Surgical History:  Procedure Laterality Date  . APPENDECTOMY    . CARDIAC DEFIBRILLATOR PLACEMENT  2010   St. Jude ICD  . COLECTOMY     "for Crohn's"  . COLON SURGERY    . FETAL SURGERY FOR CONGENITAL HERNIA    . INGUINAL HERNIA REPAIR    . IR THORACENTESIS ASP PLEURAL SPACE W/IMG GUIDE  06/28/2017    Prior to Admission medications   Medication Sig Start Date End Date Taking? Authorizing Provider  amiodarone (PACERONE) 200 MG tablet Take 1 tablet (200 mg total) by mouth daily. 07/03/17  Yes Clegg, Amy D, NP  aspirin EC 325 MG EC tablet Take 1 tablet (325 mg total) by mouth daily. 07/03/17  Yes Clegg, Amy D, NP  atorvastatin (LIPITOR) 80 MG tablet Take 1 tablet (80 mg total) by mouth daily at 6 PM. 11/28/16  Yes Tillery, Satira Mccallum, PA-C  docusate sodium  (COLACE) 100 MG capsule Take 2 capsules (200 mg total) by mouth daily. Patient taking differently: Take 200 mg daily as needed by mouth for mild constipation.  07/03/17  Yes Clegg, Amy D, NP  hydrALAZINE (APRESOLINE) 25 MG tablet Take 1.5 tablets (37.5 mg total) by mouth 3 (three) times daily. Take 37.5 mg three times a day on non hemodialysis days and 25 mg the morning of dialysis and before bed on dialysis days. 07/06/17  Yes Larey Dresser, MD  insulin aspart (NOVOLOG) 100 UNIT/ML injection Inject 3 Units into the skin 3 (three) times daily with  meals. 07/03/17  Yes Clegg, Amy D, NP  insulin glargine (LANTUS) 100 UNIT/ML injection Inject 0.05 mLs (5 Units total) into the skin daily. 07/03/17  Yes Clegg, Amy D, NP  metoCLOPramide (REGLAN) 5 MG tablet Take one tablet 1/2 hr before meals. Patient taking differently: Take by mouth 3 (three) times daily before meals.  01/10/17  Yes Esterwood, Amy S, PA-C  nitroGLYCERIN (NITROSTAT) 0.4 MG SL tablet Place 1 tablet (0.4 mg total) under the tongue every 5 (five) minutes as needed for chest pain. 07/13/16  Yes Jerline Pain, MD  ondansetron (ZOFRAN ODT) 4 MG disintegrating tablet Take 1 tablet (4 mg total) by mouth every 6 (six) hours as needed for nausea or vomiting. 01/10/17  Yes Esterwood, Amy S, PA-C  pantoprazole (PROTONIX) 40 MG tablet Take 40 mg by mouth daily.   Yes [provider]  sildenafil (REVATIO) 20 MG tablet Take 1 tablet (20 mg total) by mouth 3 (three) times daily. Patient taking differently: Take 20 mg 3 (three) times daily as needed by mouth (erectile dysfunction).  07/03/17  Yes Clegg, Amy D, NP  warfarin (COUMADIN) 5 MG tablet Take 5-7.5 mg See admin instructions by mouth. Take 5 mg (1 tablet) daily except 7.5 mg (1 and 1/2 tablets) on Mon/Wed.    Yes [provider]    Scheduled Meds: . amiodarone  150 mg Intravenous Once  . amLODipine  5 mg Oral Daily  . aspirin  325 mg Oral Daily  . atorvastatin  80 mg Oral q1800    . darbepoetin (ARANESP) injection - DIALYSIS  100 mcg Intravenous Q Mon-HD  . docusate sodium  200 mg Oral Daily  . hydrALAZINE  20 mg Intravenous Q4H  . hydrALAZINE  37.5 mg Oral Q8H  . insulin aspart  0-15 Units Subcutaneous TID WC  . insulin aspart  0-5 Units Subcutaneous QHS  . iopamidol      . metoCLOPramide (REGLAN) injection  5 mg Intravenous Q12H  . pantoprazole  40 mg Oral Daily  . sildenafil  20 mg Oral TID   Infusions: . amiodarone     Followed by  . amiodarone     PRN Meds: acetaminophen, hydrALAZINE, ondansetron (ZOFRAN) IV, ondansetron, promethazine   Allergies as of 07/10/2017 - Review Complete 07/10/2017  Allergen Reaction Noted  . Metformin and related Diarrhea 06/03/2013    Family History  Problem Relation Age of Onset  . Colon polyps Mother   . Diabetes Mother   . Heart attack Father   . Diabetes Father   . Stroke Paternal Grandfather   . Colitis Sister   . Heart disease Brother   . Colitis Sister   . Colon cancer Neg Hx     Social History   Socioeconomic History  . Marital status: Married    Spouse name: Not on file  . Number of children: Not on file  . Years of education: Not on file  . Highest education level: Not on file  Social Needs  . Financial resource strain: Not on file  . Food insecurity - worry: Not on file  . Food insecurity - inability: Not on file  . Transportation needs - medical: Not on file  . Transportation needs - non-medical: Not on file  Occupational History  . Not on file  Tobacco Use  . Smoking status: Never Smoker  . Smokeless tobacco: Never Used  Substance and Sexual Activity  . Alcohol use: No  . Drug use: No  . Sexual activity: No  Other Topics Concern  . Not on file  Social History Narrative  . Not on file    REVIEW OF SYSTEMS: Constitutional: Patient had been feeling better and stronger after the VATS surgery.  With the nausea and vomiting, he feels weak again. ENT:  No nose bleeds Pulm: Denies  shortness of breath or cough. CV:  No palpitations, no LE edema.  No chest pain GU:  No hematuria, no frequency GI:  Per HPI Heme: No unusual bleeding or bruising. Transfusions:  See HPI.   Neuro:  No headaches.  Patient previously took gabapentin for lower extremity peripheral neuropathy but since his bad surgery the neuropathy has abated and he no longer uses gabapentin. Derm:  No itching, no rash or sores.  Endocrine:  No sweats or chills.  No polyuria or dysuria Immunization: Reviewed.  He had a flu shot on 06/06/17. Travel:  None beyond local counties in last few months.    PHYSICAL EXAM: Vital signs in last 24 hours: Vitals:   07/11/17 0452 07/11/17 0719  BP: 112/84 126/88  Pulse: (!) 56   Resp:    Temp:    SpO2: 98%    Wt Readings from Last 3 Encounters:  07/11/17 78 kg (171 lb 14.4 oz)  07/10/17 84.7 kg (186 lb 12.8 oz)  07/06/17 84.3 kg (185 lb 12.8 oz)    General: Chronically ill-appearing AAM. Head: No facial asymmetry or swelling.  No signs of head trauma. Eyes: No scleral icterus, no conjunctival pallor.  EOMI. Ears: Not hard of hearing Nose: No congestion or discharge Mouth: Tongue midline.  Edentulous.  Moist, pink, clear oral mucosa. Neck: No JVD, no masses, no thyromegaly Lungs: Clear bilaterally.  No labored breathing or cough. Heart: VAD humming.  Regular underlying heartbeats audible. Abdomen: Soft.  Not tender or distended.  Fat driveline in left abdomen with clean bandage..   Rectal: Deferred Musc/Skeltl: No joint swelling, erythema, heat or gross deformity. Extremities: No lower extremity edema. Neurologic: Sleeping but easily aroused and able to maintain arousal.  Oriented x3.  Moves all 4 limbs.  No tremor. Skin: On the neck there are several small circular hyperpigmented scars, only on the right neck.  Patient says that he thinks these are from previous central line placements.   Tattoos: None seen Nodes: No cervical or inguinal  adenopathy. Psych: Flat affect, cooperative, pleasant, calm  Intake/Output from previous day: 11/05 0701 - 11/06 0700 In: 120 [P.O.:120] Out: 2450  Intake/Output this shift: No intake/output data recorded.  LAB RESULTS: Recent Labs    07/10/17 1107  WBC 10.1  HGB 9.1*  HCT 28.5*  PLT 307   BMET Lab Results  Component Value Date   NA 133 (L) 07/10/2017   NA 131 (L) 07/03/2017   NA 132 (L) 07/02/2017   K 4.5 07/10/2017   K 4.7 07/03/2017   K 4.5 07/02/2017   CL 96 (L) 07/10/2017   CL 98 (L) 07/03/2017   CL 96 (L) 07/02/2017   CO2 22 07/10/2017   CO2 23 07/03/2017   CO2 23 07/02/2017   GLUCOSE 220 (H) 07/10/2017   GLUCOSE 202 (H) 07/03/2017   GLUCOSE 147 (H) 07/02/2017   BUN 49 (H) 07/10/2017   BUN 49 (H) 07/03/2017   BUN 25 (H) 07/02/2017   CREATININE 8.84 (H) 07/10/2017   CREATININE 6.95 (H) 07/03/2017   CREATININE 4.93 (H) 07/02/2017   CALCIUM 8.6 (L) 07/10/2017   CALCIUM 8.6 (L) 07/03/2017   CALCIUM 8.7 (L) 07/02/2017  LFT Recent Labs    07/10/17 1107  PROT 7.7  ALBUMIN 2.7*  AST 26  ALT 15*  ALKPHOS 135*  BILITOT 0.5  BILIDIR <0.1*  IBILI NOT CALCULATED   PT/INR Lab Results  Component Value Date   INR 5.09 (HH) 07/10/2017   INR 3.83 07/06/2017   INR 2.55 07/03/2017   Hepatitis Panel No results for input(s): HEPBSAG, HCVAB, HEPAIGM, HEPBIGM in the last 72 hours. C-Diff No components found for: CDIFF Lipase     Component Value Date/Time   LIPASE 17 04/07/2017 0500    Drugs of Abuse     Component Value Date/Time   LABOPIA NONE DETECTED 04/05/2017 1847   COCAINSCRNUR NONE DETECTED 04/05/2017 1847   LABBENZ NONE DETECTED 04/05/2017 1847   AMPHETMU NONE DETECTED 04/05/2017 1847   THCU NONE DETECTED 04/05/2017 1847   LABBARB NONE DETECTED 04/05/2017 1847     RADIOLOGY STUDIES: No results found.    IMPRESSION:   *  Nausea vomiting, acute flare of intermittent, long-standing problem.   Established dx of gastroparesis dates to  at least 2013 when he had positive GES.   H Pylori negative gastritis on EGD in 2009.   S/p right colectomy, ileostomy and later reversal for complicated appendiceal perforation 2003.   Rectal prolapse polyps.  Positive Crohns markers in 2002 with ? Of rectal Crohns.  Not on IBD meds.  Periodic diarrhea with negative GI path panel in 04/2017.  Elevated LFTs prior to VAD placement, suspect this was from congestive hepatopathy from heart failure  *  Ischemic CM.  S/p LVAD 06/12/17.  Remote ICD. BNP 758 on 10/29, not rechecked this admission.      *  ESRD, began HD after CVVH in peri-op period.    *  Chronic anemia for years.    *  Chronic Coumadin for years for chronic DVT.     PLAN:     *   Setting up a time for EGD tomorrow but will only pursue if his symptoms persist.  Await CT scan, not sure it is/was necessary but await reading.   Chest Reglan dosing to 5 mg IV every 6 hours, not upping to 10 mg due to ESRD.  Scheduled Phenergan 12.5 mg IV  q 8 hours, nurse says phenergan helping more than Zofran.  Azucena Freed  07/11/2017, 8:24 AM Pager: [phone]  [phone] Addendum CT ab/pelvis sans contrast: Postsurgical changes right colon. Portions of bowel are under distended however no extraluminal bowel inflammatory process is noted. Dense gallbladder may represent vicarious excretion of contrast. If primary gallbladder abnormality were of concern, ultrasound may then be considered.

## 2017-07-11 NOTE — Progress Notes (Signed)
Lab notified about AM lab draws not drawn.  Per lab was "reject" Will send another tech to draw.   Will continue to monitor.  Saunders Revel T

## 2017-07-11 NOTE — Progress Notes (Signed)
LVAD Coordinator Rounding Note:  Admitted 07/11/17 for intractable nausea/vomiting along with Hypertension.   HM3LVAD implanted on 10/8/2018by Dr Craige Cotta [facility]  Vital signs: Temp: 98 HR: 93 Doppler: 80 Auto cuff: 126/88 (102) O2 Sat: 98% on RA  Wt:171lbs   LVAD interrogation reveals:  Speed: 5600 Flow:  4.2 Power: 4.3w PI: 4.3 Alarms: none Events: none Hematocrit: 31 Fixed speed: 5600 Low speed limit: 5300   Drive Line: Daily. Change twice a week. Due Thursday 11/8/.   Labs:  LDH trend: 255>383  INR trend: 5.09>5.47  Hgb trend: 9.1>9.9  Anticoagulation Plan: -INR Goal: 2-3, warfarin restarted 10/22  -ASA Dose: 316m (will remain on this with factor V heterozygosity)  Gtts: Amio @ 60 mg started 07/11/17 for afib RVR  Device: -St Jude  -Therapies: ON   Renal:  Creat trend: 8.84 HD - MWF  Significant Events on Support: CT of abd for increase N/V 07/11/17  Plan/Recommendations:  1. Continue to manage pts N/V and HTN with IV meds.  STanda RockersRN, VAD Coordinator 24/7 pager [phone]

## 2017-07-11 NOTE — Progress Notes (Signed)
Pt nausea has decreased a little.  Still unable to drink contrast at this time.  Will continue to monitor. Saunders Revel T

## 2017-07-11 NOTE — Progress Notes (Signed)
Notified Md pt still nausea after prn medication. pt stated " zofran don't work".  Unable to drink contrast.  Questioned if still wanted CT.  Will continue to monitor. Saunders Revel T

## 2017-07-11 NOTE — Progress Notes (Addendum)
ANTICOAGULATION CONSULT NOTE -Follow Up Consult  Pharmacy Consult for Warfarin Indication: LVAD -HM3  Allergies  Allergen Reactions  . Metformin And Related Diarrhea    Patient Measurements: Height: 5' 8"  (172.7 cm) Weight: 171 lb 14.4 oz (78 kg) IBW/kg (Calculated) : 68.4  Vital Signs: Temp: 98.5 F (36.9 C) (11/06 1212) Temp Source: Oral (11/06 1212) BP: 136/97 (11/06 1212) Pulse Rate: 85 (11/06 1212)  Labs: Recent Labs    07/10/17 1107 07/11/17 0801 07/11/17 0958  HGB 9.1*  --  9.9*  HCT 28.5*  --  31.5*  PLT 307  --  355  LABPROT 47.3* 49.4*  --   INR 5.09* 5.47*  --   CREATININE 8.84*  --  5.60*    Estimated Creatinine Clearance: 14.9 mL/min (A) (by C-G formula based on SCr of 5.6 mg/dL (H)).   Medical History: Past Medical History:  Diagnosis Date  . Angina   . ASCVD (arteriosclerotic cardiovascular disease)    , Anterior infarction 2005, LAD diagonal bifurcation intervention 03/2004  . Automatic implantable cardiac defibrillator -St. Jude's       . Benign neoplasm of colon   . CHF (congestive heart failure) ([place])   . Chronic systolic heart failure ([place])   . Coronary artery disease     Widely patent previously placed stents in the left anterior   . Crohn's disease ([place])   . 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 Dyspnea   . Gastroparesis   . GERD (gastroesophageal reflux disease)   . High cholesterol   . Hyperlipidemia   . Hypersomnolent    Previous diagnosis of narcolepsy  . Hypertension, essential   . Ischemic cardiomyopathy    Ejection fraction 15-20% catheterization 2010  . Type II or unspecified type diabetes mellitus without mention of complication, not stated as uncontrolled   . Unspecified gastritis and gastroduodenitis without mention of hemorrhage      Assessment: LVAD HM3 and CKD on HD admitted wirh HTN, N/V.  INR 5.09>5.47 despite holding warfarin, LDH ok 255, CBC stable.     Goal of Therapy:  INR  2-3 Monitor platelets by anticoagulation protocol: Yes   Plan:  Hold Warfarin today Daily INR  Bonnita Nasuti Pharm.D. CPP, BCPS Clinical Pharmacist [phone] 07/11/2017 3:58 PM

## 2017-07-11 NOTE — Progress Notes (Signed)
Pt doppler pressure 120. On call [facility] MD paged. No new orders received. Scheduled hydralazine given, Will recheck in one hour. Will continue to monitor.

## 2017-07-11 NOTE — Progress Notes (Signed)
[facility]  Patient Status: Active (receiving services up to time of hospitalization)  [facility] is providing the following services: RN, PT and OT  If patient discharges after hours, please call [phone].   [name] 07/11/2017, 9:54 AM

## 2017-07-11 NOTE — Progress Notes (Addendum)
Advanced Heart Failure VAD Team Note  Subjective:    [name] is a 51 y.o. male with history of CAD s/p anterior MI in 2010 and NSTEMI in 3/18 with DES to pRCA and mLAD and ischemic cardiomyopathy now s/p Heartmate 3 LVAD and ESRD   He was admitted on 11/5 with about 24 hrs of intractable nausea/vomiting.  He had HD yesterday in hospital, weight down.   He continues to be nauseated today.  Still vomiting and unable to hold anything down.  Has not had CT abdomen/pelvis as unable to hold down contrast.  Getting IV hydralazine, MAP remains 90s-100s.  Today after vomiting HR jumped up to 110s and he was noted to be in atrial flutter with RVR.  He denies dyspnea.    Labs not back yet today (trouble with draw) but INR yesterday was 5.   LVAD INTERROGATION:  HeartMate II LVAD:  Flow 4.6 liters/min, speed 5600, power 4.3, PI 3.8.  No alarms or PI events.    Objective:    Vital Signs:   Temp:  [97.7 F (36.5 C)-99.8 F (37.7 C)] 98.7 F (37.1 C) (11/06 0300) Pulse Rate:  [56-96] 56 (11/06 0452) Resp:  [12-23] 14 (11/06 0300) BP: (99-152)/(51-99) 126/88 (11/06 0719) SpO2:  [94 %-100 %] 98 % (11/06 0452) Weight:  [171 lb 14.4 oz (78 kg)-185 lb 6.4 oz (84.1 kg)] 171 lb 14.4 oz (78 kg) (11/06 0329) Last BM Date: 07/09/17 Mean arterial Pressure 100s  Intake/Output:   Intake/Output Summary (Last 24 hours) at 07/11/2017 0816 Last data filed at 07/10/2017 1949 Gross per 24 hour  Intake 120 ml  Output 2450 ml  Net -2330 ml     Physical Exam    General:  Sleepy after nausea meds.  HEENT: normal, nonicteric Neck: supple. JVP not elevated. No lymphadenopathy or thyromegaly appreciated. Cor: Mechanical heart sounds with LVAD hum present. Lungs: clear Abdomen: soft, mild diffuse tenderness, nondistended. No hepatosplenomegaly. No bruits or masses. Good bowel sounds. Driveline: C/D/I; securement device intact and driveline incorporated Extremities: no cyanosis, clubbing, rash,  edema Neuro: alert & orientedx3, cranial nerves grossly intact. moves all 4 extremities w/o difficulty. Affect pleasant   Telemetry   Atrial flutter in 110s, personally reviewed.    Labs   Basic Metabolic Panel: Recent Labs  Lab 07/10/17 1107  NA 133*  K 4.5  CL 96*  CO2 22  GLUCOSE 220*  BUN 49*  CREATININE 8.84*  CALCIUM 8.6*    Liver Function Tests: Recent Labs  Lab 07/10/17 1107  AST 26  ALT 15*  ALKPHOS 135*  BILITOT 0.5  PROT 7.7  ALBUMIN 2.7*   No results for input(s): LIPASE, AMYLASE in the last 168 hours. No results for input(s): AMMONIA in the last 168 hours.  CBC: Recent Labs  Lab 07/06/17 1134 07/10/17 1107  WBC 13.7* 10.1  HGB 10.5* 9.1*  HCT 32.6* 28.5*  MCV 81.9 79.8  PLT 373 307    INR: Recent Labs  Lab 07/06/17 1134 07/10/17 1107  INR 3.83 5.09*    Other results:  EKG:    Imaging    No results found.   Medications:     Scheduled Medications: . amiodarone  150 mg Intravenous Once  . amLODipine  5 mg Oral Daily  . aspirin  325 mg Oral Daily  . atorvastatin  80 mg Oral q1800  . darbepoetin (ARANESP) injection - DIALYSIS  100 mcg Intravenous Q Mon-HD  . docusate sodium  200 mg Oral Daily  .  hydrALAZINE  20 mg Intravenous Q4H  . hydrALAZINE  37.5 mg Oral Q8H  . insulin aspart  0-15 Units Subcutaneous TID WC  . insulin aspart  0-5 Units Subcutaneous QHS  . iopamidol      . metoCLOPramide (REGLAN) injection  5 mg Intravenous Q12H  . pantoprazole  40 mg Oral Daily  . sildenafil  20 mg Oral TID     Infusions: . amiodarone     Followed by  . amiodarone       PRN Medications:  acetaminophen, hydrALAZINE, ondansetron (ZOFRAN) IV, ondansetron, promethazine   Patient Profile   [name] is a 51 y.o. male with history of CAD s/p anterior MI in 2010 and NSTEMI in 3/18 with DES to pRCA and mLAD and ischemic cardiomyopathy now s/p Heartmate 3 LVAD and ESRD.   Admitted from clinic 07/10/17 with N/V with  active vomiting and dry heaves as well as HTN.   Assessment/Plan:    1. Nausea/vomiting: Patient has a prior history of this when volume overloaded though weight was only up 1 lb since 11/1 at admission, and he does not seem to have improved much with fluid off at HD yesterday.  He also has a history of diabetic gastroparesis which has led to severe GI symptoms in the past.  He additionally has Crohns disease.  No diarrhea, melena, BRBPR.  LFTs are normal.  Mild diffuse abdominal tenderness. Afebrile.  - Hold po meds for now until nausea/vomiting resolves.  - Will give IV hydralazine as below.  - Will give IV Reglan and Zofran for symptomatic treatment.  - Will check CT Abdomen/pelvis without contrast give acute onset => being done this morning.   - Will ask GI to evaluate.   2. HTN: MAP remains elevated, this has been complicated to control given HD.  However, needs good control for LVAD function.  - Increase hydralazine to 20 mg IV every 4 hrs scheduled until he can take po. Will resume previous po dose (Recently increased) as tolerated (hydralazine 37.5 mg tid).  - He is likely going to need an additional medication.  Will start amlodipine 5 mg daily (rather than ARB or Entresto as still slight hope for renal recovery) when he can take po.  3. ESRD: Had HD on 11/5. Renal following.  ?Symptoms due to volume overload and need to lower dry weight, but he did not improve much with HD yesterday.  4. Chronic systolic CHF: Ischemic cardiomyopathy. [employer] ICD. NSTEMI in March 2018 with DES to LAD and RCA, complicated by cardiogenic shock, low output requiring milrinone.Echo 8/18 with EF 20-25%, moderate MR. PX (8/18) with severe functional impairment due to HF.He is now s/p HM3 LVAD placement. - Will discuss volume status with renal => does not appear overloaded but in past has had nausea/vomiting with volume overload. .  - LVAD parameters reviewed today and stable despite high BP.    - MAP  remains elevated as above.  - Given heterozygosity for factor V Leiden and history of clotting, will continue ASA at 325 mg daily and aim for coumadin with INR 2-3.INR 5 yesterday, warfarin on hold pending today's INR.    - Continue sildenafil for RV support when he can take po.  5. CAD: NSTEMI in 3/18. LHC with 99% ulcerated lesion proximal RCA with left to right collaterals, 95% mid LAD stenosis after mid LAD stent. s/p PCI to RCA and LAD on 11/25/16.  -Continue statin and ASA. 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  7.Diabetic gastroparesis: Suspect this contributes to symptoms.   8. DM: Sliding scale for now.  9. PAD: Moderate bilateral disease on ABIs in 8/18. Denies claudication currently. No change.  10. Atrial flutter: with RVR, developed after vomiting this morning.  He has been on amiodarone 200 daily, will change over to amiodarone gtt since he has not been getting amiodarone po due to nausea/vomiting.  Hopefully will convert back to NSR.   I reviewed the LVAD parameters from today, and compared the results to the patient's prior recorded data.  No programming changes were made.  The LVAD is functioning within specified parameters.  The patient performs LVAD self-test daily.  LVAD interrogation was negative for any significant power changes, alarms or PI events/speed drops.  LVAD equipment check completed and is in good working order.  Back-up equipment present.   LVAD education done on emergency procedures and precautions and reviewed exit site care.  Length of Stay: 1  Loralie Champagne, MD 07/11/2017, 8:16 AM  VAD Team --- VAD ISSUES ONLY--- Pager [phone] (7am - 7am)  Advanced Heart Failure Team  Pager [phone] (M-F; 7a - 4p)  Please contact [facility] for night-coverage after hours (4p -7a ) and weekends on [email or web address]  I discussed the patient with Dr. Posey Pronto with nephrology.  He has actually been slightly below what was thought to be his dry weight so unlikely to be  symptomatic due to volume overload.   [name] 07/11/2017 12:36 PM

## 2017-07-11 NOTE — Progress Notes (Addendum)
CRITICAL VALUE ALERT  Critical Value:  5.47  Date & Time Notied:  0915  Provider Notified: Oda Kilts, PA  Orders Received/Actions taken:   No coumadin to be given. Currently, there are no signs of bleeding.  Will continue to monitor the patient.

## 2017-07-11 NOTE — Progress Notes (Signed)
Per Md will hold off on CT scan tonight until [facility] rounds, due to pt unable to drink contrast. Md suggested pt to try Zofran.  Will encourage pt.   Will continue to monitor.   Saunders Revel T

## 2017-07-11 NOTE — Progress Notes (Signed)
Patient ID: [name], male   DOB: July 31, 1966, 51 y.o.   MRN: [id number]  [facility] Progress Note   Assessment/ Plan:   1.  Nausea/vomiting: Difficulty noted with symptom management overnight-awaiting CT scan of the abdomen and pelvis when able to tolerate oral contrast to evaluate for etiology of symptoms. 2.  ESRD:  underwent hemodialysis yesterday without problems-plan for his next dialysis again tomorrow as he does not appear to have any clinical/acute indications at this time. 3.  Hypertension/volume:  blood pressures improved with ultrafiltration/hemodialysis and possibly some relief of nausea/vomiting. 4.  Anemia:  status post Aranesp for anemia-continue to monitor hemoglobin/hematocrit trend. No overt losses. 5.  Metabolic bone disease: Ca ok, Phos pending. Monitor for now. 6. Severe ischemic cardiomyopathy (s/p LVAD): Per cardiology. 7. Type 2 DM 8. Crohn's Disease  Subjective:   Reports persistent nausea overnight-unfortunately posing difficulties with getting in oral contrast for CT abdomen/pelvis to evaluate for etiology.    Objective:   BP 126/88   Pulse (!) 56   Temp 98.7 F (37.1 C) (Oral)   Resp 14   Ht 5' 8"  (1.727 m)   Wt 78 kg (171 lb 14.4 oz)   SpO2 98%   BMI 26.14 kg/m   Physical Exam: Gen: Comfortably sleeping in bed CVS: Regular bradycardia, S1 and S2 normal Resp: More inspiratory effort with decreased breath sounds over bases-no distinct rales or rhonchi Abd: Soft, flat, nontender Ext: No lower extremity edema  Labs: BMET Recent Labs  Lab 07/10/17 1107  NA 133*  K 4.5  CL 96*  CO2 22  GLUCOSE 220*  BUN 49*  CREATININE 8.84*  CALCIUM 8.6*   CBC Recent Labs  Lab 07/06/17 1134 07/10/17 1107  WBC 13.7* 10.1  HGB 10.5* 9.1*  HCT 32.6* 28.5*  MCV 81.9 79.8  PLT 373 307   Medications:    . amiodarone  200 mg Oral Daily  . amLODipine  5 mg Oral Daily  . aspirin  325 mg Oral Daily  . atorvastatin  80 mg Oral  q1800  . darbepoetin (ARANESP) injection - DIALYSIS  100 mcg Intravenous Q Mon-HD  . docusate sodium  200 mg Oral Daily  . hydrALAZINE  10 mg Intravenous Q4H  . hydrALAZINE  37.5 mg Oral Q8H  . insulin aspart  0-15 Units Subcutaneous TID WC  . insulin aspart  0-5 Units Subcutaneous QHS  . iopamidol      . metoCLOPramide (REGLAN) injection  5 mg Intravenous Q12H  . pantoprazole  40 mg Oral Daily   Elmarie Shiley, MD 07/11/2017, 7:54 AM

## 2017-07-11 NOTE — H&P (View-Only) (Signed)
[facility] Consult: 8:24 AM 07/11/2017  LOS: 1 day    Referring Provider: Dr Aundra Dubin  Primary Care Physician:  [name], MD Primary Gastroenterologist:  Dr. [name] >> [name] is designated GI.     Reason for Consultation:  Nausea vomiting in diabetic   HPI: [name] is a 51 y.o. male. Type 2 DM, longstanding.  CAD.  MIs in 2005, with stenting.  CHF/cardiogenic shock, Non-STEMI leading to cardiac DES 11/2016.  Plavix added (but since stopped) to Coumadin then.  S/p ICD 2010.  S/p 06/12/17 LVAD per Dr Prescott Gum.   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  Factor V Leiden.   Pleural effusion, left thoracentesis 06/28/17.  Multiple tooth extractions 06/2017.  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  Lung nodules.  ADHD.  Narcolepsy.    Right inguinal hernia repair age 38. Gangrenous appendicitis with perforation, abscess, peritonitis, s/p ascending colectomy/appendectomy diverting ileostomy 02/2002. Ileostomy reversed 06/2002, post op course prolonged by N/V.   Pt has no knowledge of "fetal surgery for congenital hernia"   2002 Colonoscopy: inflammatory ano-rectal polyps c/w anal prolapse. Serologic markers positive for Crohns, so possible rectal Crohn's   2009 Colonoscopy for IDA, FOBT +, diarrhea.  Previous right colectomy.  Patent anastomosis.  Left sided tics.  Inflammatory rectal polyps, path: mucosal prolapse with focal  ulceration. 2009  EGD.  Nodular gastritis in cardia, antrum.  Biopsy: minor chronic gastritis, no villous atrophy or changes of sprue. 11/2011 GES: Delayed emptying. 85% retention at 60 minutes.  72% retention at 120 minutes. 04/28/17 CT ab/pelvis pre VAD:  Esophagus, biliary tract, GB, liver, pancreas unremarkable.   Intermittent N/V for years.  Up to 5  episodes non-bloody emesis in 24 hours.  On chronic Protonix, no reflux or heartburn sxs.  Frequent sense of abdominal fullness.  PO Zofran not helpful.  Long standing spells of loose stools but no blood or melena.  Reglan used in past but not clear if helpful; trial of 5 mg before meals along with PRN Zofran and step 3 Gastroparesis diet per [name] on 01/10/17.  Patient not sure if he was using the metoclopramide recently, it is not listed on his outpatient med list.  PRN Zofran and daily Protonix  are on the list. During the admission of 06/07/17 -07/04/17 for heart failure and LVAD, he was transfused with multiple blood products including 4 units PRBCs, platelets, and FFP.    Admitted yesterday with N/V, unable to take po meds.   This began Sunday morning out of the blue. Up until then he says he been tolerating his meals just fine. This spell of green/bilious, nonbloody emesis lasted a lot longer than normal so the heart failure team admitted him to the hospital.  Up until then he says he been tolerating his meals just fine.  He been having regular brown stools, the last of these was yesterday morning. Alk phos 135, elevations beginning 3 weeks ago, 120-130s.  All other LFTs and not currently elevated but transaminases to max 83/65  on 10/5, normalized within 6 days, . Latest Lipase was normal on 8/3, not checked since.   Anemic, MCV ~ 80.  Hgb ranging 8 to 10 in last 3 months.  7.7 on 10/18, transfused 1 U PRBC then.   Hgb A1c of 10.1 on 10/3.  Serum glucose ranging 80s to 336 but generally in 120s to 220s.   PT/INR 47/5 yesterday.   Hepatitis serologies negative in 04/2017.   CT ab/pelvis repeated: It has not been read and there is no report yet  Patient denies any new medications.  He is been tolerating his MWF boluses sessions without incidence, though he is only had 2 outpatient sessions since discharge from heart failure/LVAD admission 07/04/17.  He denies abdominal pain.  Does not have  problems with reflux symptoms or dysphagia.  No bloody or melenic stools.  No constipation.  No FHx colon cancer or ulcer disease.[name]   Past Medical History:  Diagnosis Date  . Angina   . ASCVD (arteriosclerotic cardiovascular disease)    , Anterior infarction 2005, LAD diagonal bifurcation intervention 03/2004  . Automatic implantable cardiac defibrillator -St. Jude's       . Benign neoplasm of colon   . CHF (congestive heart failure) ([place])   . Chronic systolic heart failure ([place])   . Coronary artery disease     Widely patent previously placed stents in the left anterior   . Crohn's disease ([place])   . 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 Dyspnea   . Gastroparesis   . GERD (gastroesophageal reflux disease)   . High cholesterol   . Hyperlipidemia   . Hypersomnolent    Previous diagnosis of narcolepsy  . Hypertension, essential   . Ischemic cardiomyopathy    Ejection fraction 15-20% catheterization 2010  . Type II or unspecified type diabetes mellitus without mention of complication, not stated as uncontrolled   . Unspecified gastritis and gastroduodenitis without mention of hemorrhage     Past Surgical History:  Procedure Laterality Date  . APPENDECTOMY    . CARDIAC DEFIBRILLATOR PLACEMENT  2010   St. Jude ICD  . COLECTOMY     "for Crohn's"  . COLON SURGERY    . FETAL SURGERY FOR CONGENITAL HERNIA    . INGUINAL HERNIA REPAIR    . IR THORACENTESIS ASP PLEURAL SPACE W/IMG GUIDE  06/28/2017    Prior to Admission medications   Medication Sig Start Date End Date Taking? Authorizing Provider  amiodarone (PACERONE) 200 MG tablet Take 1 tablet (200 mg total) by mouth daily. 07/03/17  Yes Clegg, Amy D, NP  aspirin EC 325 MG EC tablet Take 1 tablet (325 mg total) by mouth daily. 07/03/17  Yes Clegg, Amy D, NP  atorvastatin (LIPITOR) 80 MG tablet Take 1 tablet (80 mg total) by mouth daily at 6 PM. 11/28/16  Yes Tillery, Satira Mccallum, PA-C  docusate sodium  (COLACE) 100 MG capsule Take 2 capsules (200 mg total) by mouth daily. Patient taking differently: Take 200 mg daily as needed by mouth for mild constipation.  07/03/17  Yes Clegg, Amy D, NP  hydrALAZINE (APRESOLINE) 25 MG tablet Take 1.5 tablets (37.5 mg total) by mouth 3 (three) times daily. Take 37.5 mg three times a day on non hemodialysis days and 25 mg the morning of dialysis and before bed on dialysis days. 07/06/17  Yes Larey Dresser, MD  insulin aspart (NOVOLOG) 100 UNIT/ML injection Inject 3 Units into the skin 3 (three) times daily with  meals. 07/03/17  Yes Clegg, Amy D, NP  insulin glargine (LANTUS) 100 UNIT/ML injection Inject 0.05 mLs (5 Units total) into the skin daily. 07/03/17  Yes Clegg, Amy D, NP  metoCLOPramide (REGLAN) 5 MG tablet Take one tablet 1/2 hr before meals. Patient taking differently: Take by mouth 3 (three) times daily before meals.  01/10/17  Yes Esterwood, Amy S, PA-C  nitroGLYCERIN (NITROSTAT) 0.4 MG SL tablet Place 1 tablet (0.4 mg total) under the tongue every 5 (five) minutes as needed for chest pain. 07/13/16  Yes Jerline Pain, MD  ondansetron (ZOFRAN ODT) 4 MG disintegrating tablet Take 1 tablet (4 mg total) by mouth every 6 (six) hours as needed for nausea or vomiting. 01/10/17  Yes Esterwood, Amy S, PA-C  pantoprazole (PROTONIX) 40 MG tablet Take 40 mg by mouth daily.   Yes [provider]  sildenafil (REVATIO) 20 MG tablet Take 1 tablet (20 mg total) by mouth 3 (three) times daily. Patient taking differently: Take 20 mg 3 (three) times daily as needed by mouth (erectile dysfunction).  07/03/17  Yes Clegg, Amy D, NP  warfarin (COUMADIN) 5 MG tablet Take 5-7.5 mg See admin instructions by mouth. Take 5 mg (1 tablet) daily except 7.5 mg (1 and 1/2 tablets) on Mon/Wed.    Yes [provider]    Scheduled Meds: . amiodarone  150 mg Intravenous Once  . amLODipine  5 mg Oral Daily  . aspirin  325 mg Oral Daily  . atorvastatin  80 mg Oral q1800    . darbepoetin (ARANESP) injection - DIALYSIS  100 mcg Intravenous Q Mon-HD  . docusate sodium  200 mg Oral Daily  . hydrALAZINE  20 mg Intravenous Q4H  . hydrALAZINE  37.5 mg Oral Q8H  . insulin aspart  0-15 Units Subcutaneous TID WC  . insulin aspart  0-5 Units Subcutaneous QHS  . iopamidol      . metoCLOPramide (REGLAN) injection  5 mg Intravenous Q12H  . pantoprazole  40 mg Oral Daily  . sildenafil  20 mg Oral TID   Infusions: . amiodarone     Followed by  . amiodarone     PRN Meds: acetaminophen, hydrALAZINE, ondansetron (ZOFRAN) IV, ondansetron, promethazine   Allergies as of 07/10/2017 - Review Complete 07/10/2017  Allergen Reaction Noted  . Metformin and related Diarrhea 06/03/2013    Family History  Problem Relation Age of Onset  . Colon polyps Mother   . Diabetes Mother   . Heart attack Father   . Diabetes Father   . Stroke Paternal Grandfather   . Colitis Sister   . Heart disease Brother   . Colitis Sister   . Colon cancer Neg Hx     Social History   Socioeconomic History  . Marital status: Married    Spouse name: Not on file  . Number of children: Not on file  . Years of education: Not on file  . Highest education level: Not on file  Social Needs  . Financial resource strain: Not on file  . Food insecurity - worry: Not on file  . Food insecurity - inability: Not on file  . Transportation needs - medical: Not on file  . Transportation needs - non-medical: Not on file  Occupational History  . Not on file  Tobacco Use  . Smoking status: Never Smoker  . Smokeless tobacco: Never Used  Substance and Sexual Activity  . Alcohol use: No  . Drug use: No  . Sexual activity: No  Other Topics Concern  . Not on file  Social History Narrative  . Not on file    REVIEW OF SYSTEMS: Constitutional: Patient had been feeling better and stronger after the VATS surgery.  With the nausea and vomiting, he feels weak again. ENT:  No nose bleeds Pulm: Denies  shortness of breath or cough. CV:  No palpitations, no LE edema.  No chest pain GU:  No hematuria, no frequency GI:  Per HPI Heme: No unusual bleeding or bruising. Transfusions:  See HPI.   Neuro:  No headaches.  Patient previously took gabapentin for lower extremity peripheral neuropathy but since his bad surgery the neuropathy has abated and he no longer uses gabapentin. Derm:  No itching, no rash or sores.  Endocrine:  No sweats or chills.  No polyuria or dysuria Immunization: Reviewed.  He had a flu shot on 06/06/17. Travel:  None beyond local counties in last few months.    PHYSICAL EXAM: Vital signs in last 24 hours: Vitals:   07/11/17 0452 07/11/17 0719  BP: 112/84 126/88  Pulse: (!) 56   Resp:    Temp:    SpO2: 98%    Wt Readings from Last 3 Encounters:  07/11/17 78 kg (171 lb 14.4 oz)  07/10/17 84.7 kg (186 lb 12.8 oz)  07/06/17 84.3 kg (185 lb 12.8 oz)    General: Chronically ill-appearing AAM. Head: No facial asymmetry or swelling.  No signs of head trauma. Eyes: No scleral icterus, no conjunctival pallor.  EOMI. Ears: Not hard of hearing Nose: No congestion or discharge Mouth: Tongue midline.  Edentulous.  Moist, pink, clear oral mucosa. Neck: No JVD, no masses, no thyromegaly Lungs: Clear bilaterally.  No labored breathing or cough. Heart: VAD humming.  Regular underlying heartbeats audible. Abdomen: Soft.  Not tender or distended.  Fat driveline in left abdomen with clean bandage..   Rectal: Deferred Musc/Skeltl: No joint swelling, erythema, heat or gross deformity. Extremities: No lower extremity edema. Neurologic: Sleeping but easily aroused and able to maintain arousal.  Oriented x3.  Moves all 4 limbs.  No tremor. Skin: On the neck there are several small circular hyperpigmented scars, only on the right neck.  Patient says that he thinks these are from previous central line placements.   Tattoos: None seen Nodes: No cervical or inguinal  adenopathy. Psych: Flat affect, cooperative, pleasant, calm  Intake/Output from previous day: 11/05 0701 - 11/06 0700 In: 120 [P.O.:120] Out: 2450  Intake/Output this shift: No intake/output data recorded.  LAB RESULTS: Recent Labs    07/10/17 1107  WBC 10.1  HGB 9.1*  HCT 28.5*  PLT 307   BMET Lab Results  Component Value Date   NA 133 (L) 07/10/2017   NA 131 (L) 07/03/2017   NA 132 (L) 07/02/2017   K 4.5 07/10/2017   K 4.7 07/03/2017   K 4.5 07/02/2017   CL 96 (L) 07/10/2017   CL 98 (L) 07/03/2017   CL 96 (L) 07/02/2017   CO2 22 07/10/2017   CO2 23 07/03/2017   CO2 23 07/02/2017   GLUCOSE 220 (H) 07/10/2017   GLUCOSE 202 (H) 07/03/2017   GLUCOSE 147 (H) 07/02/2017   BUN 49 (H) 07/10/2017   BUN 49 (H) 07/03/2017   BUN 25 (H) 07/02/2017   CREATININE 8.84 (H) 07/10/2017   CREATININE 6.95 (H) 07/03/2017   CREATININE 4.93 (H) 07/02/2017   CALCIUM 8.6 (L) 07/10/2017   CALCIUM 8.6 (L) 07/03/2017   CALCIUM 8.7 (L) 07/02/2017  LFT Recent Labs    07/10/17 1107  PROT 7.7  ALBUMIN 2.7*  AST 26  ALT 15*  ALKPHOS 135*  BILITOT 0.5  BILIDIR <0.1*  IBILI NOT CALCULATED   PT/INR Lab Results  Component Value Date   INR 5.09 (HH) 07/10/2017   INR 3.83 07/06/2017   INR 2.55 07/03/2017   Hepatitis Panel No results for input(s): HEPBSAG, HCVAB, HEPAIGM, HEPBIGM in the last 72 hours. C-Diff No components found for: CDIFF Lipase     Component Value Date/Time   LIPASE 17 04/07/2017 0500    Drugs of Abuse     Component Value Date/Time   LABOPIA NONE DETECTED 04/05/2017 1847   COCAINSCRNUR NONE DETECTED 04/05/2017 1847   LABBENZ NONE DETECTED 04/05/2017 1847   AMPHETMU NONE DETECTED 04/05/2017 1847   THCU NONE DETECTED 04/05/2017 1847   LABBARB NONE DETECTED 04/05/2017 1847     RADIOLOGY STUDIES: No results found.    IMPRESSION:   *  Nausea vomiting, acute flare of intermittent, long-standing problem.   Established dx of gastroparesis dates to  at least 2013 when he had positive GES.   H Pylori negative gastritis on EGD in 2009.   S/p right colectomy, ileostomy and later reversal for complicated appendiceal perforation 2003.   Rectal prolapse polyps.  Positive Crohns markers in 2002 with ? Of rectal Crohns.  Not on IBD meds.  Periodic diarrhea with negative GI path panel in 04/2017.  Elevated LFTs prior to VAD placement, suspect this was from congestive hepatopathy from heart failure  *  Ischemic CM.  S/p LVAD 06/12/17.  Remote ICD. BNP 758 on 10/29, not rechecked this admission.      *  ESRD, began HD after CVVH in peri-op period.    *  Chronic anemia for years.    *  Chronic Coumadin for years for chronic DVT.     PLAN:     *   Setting up a time for EGD tomorrow but will only pursue if his symptoms persist.  Await CT scan, not sure it is/was necessary but await reading.   Chest Reglan dosing to 5 mg IV every 6 hours, not upping to 10 mg due to ESRD.  Scheduled Phenergan 12.5 mg IV  q 8 hours, nurse says phenergan helping more than Zofran.  Azucena Freed  07/11/2017, 8:24 AM Pager: [phone]  [phone] Addendum CT ab/pelvis sans contrast: Postsurgical changes right colon. Portions of bowel are under distended however no extraluminal bowel inflammatory process is noted. Dense gallbladder may represent vicarious excretion of contrast. If primary gallbladder abnormality were of concern, ultrasound may then be considered.

## 2017-07-12 ENCOUNTER — Encounter (HOSPITAL_COMMUNITY)
Admission: AD | Disposition: A | Discharge status: Home Health | Payer: Self-pay | Source: Ambulatory Visit | Attending: Cardiology

## 2017-07-12 ENCOUNTER — Encounter (HOSPITAL_COMMUNITY): Payer: BLUE CROSS/BLUE SHIELD

## 2017-07-12 ENCOUNTER — Inpatient Hospital Stay (HOSPITAL_COMMUNITY): Payer: BLUE CROSS/BLUE SHIELD | Admitting: Anesthesiology

## 2017-07-12 ENCOUNTER — Inpatient Hospital Stay (HOSPITAL_COMMUNITY): Payer: BLUE CROSS/BLUE SHIELD

## 2017-07-12 ENCOUNTER — Encounter (HOSPITAL_COMMUNITY): Payer: Self-pay | Admitting: *Deleted

## 2017-07-12 ENCOUNTER — Other Ambulatory Visit (HOSPITAL_COMMUNITY): Payer: Self-pay | Admitting: *Deleted

## 2017-07-12 DIAGNOSIS — K449 Diaphragmatic hernia without obstruction or gangrene: Secondary | ICD-10-CM

## 2017-07-12 DIAGNOSIS — K3184 Gastroparesis: Secondary | ICD-10-CM

## 2017-07-12 HISTORY — PX: ESOPHAGOGASTRODUODENOSCOPY: SHX5428

## 2017-07-12 LAB — GLUCOSE, CAPILLARY
GLUCOSE-CAPILLARY: 143 mg/dL — AB (ref 65–99)
Glucose-Capillary: 135 mg/dL — ABNORMAL HIGH (ref 65–99)
Glucose-Capillary: 93 mg/dL (ref 65–99)

## 2017-07-12 LAB — CBC WITH DIFFERENTIAL/PLATELET
BASOS ABS: 0 10*3/uL (ref 0.0–0.1)
BASOS PCT: 0 %
EOS ABS: 0 10*3/uL (ref 0.0–0.7)
EOS PCT: 0 %
HCT: 32.2 % — ABNORMAL LOW (ref 39.0–52.0)
Hemoglobin: 10.4 g/dL — ABNORMAL LOW (ref 13.0–17.0)
LYMPHS PCT: 8 %
Lymphs Abs: 1.2 10*3/uL (ref 0.7–4.0)
MCH: 25.5 pg — ABNORMAL LOW (ref 26.0–34.0)
MCHC: 32.3 g/dL (ref 30.0–36.0)
MCV: 78.9 fL (ref 78.0–100.0)
Monocytes Absolute: 1 10*3/uL (ref 0.1–1.0)
Monocytes Relative: 7 %
Neutro Abs: 12 10*3/uL — ABNORMAL HIGH (ref 1.7–7.7)
Neutrophils Relative %: 85 %
PLATELETS: 401 10*3/uL — AB (ref 150–400)
RBC: 4.08 MIL/uL — ABNORMAL LOW (ref 4.22–5.81)
RDW: 16.6 % — AB (ref 11.5–15.5)
WBC: 14.2 10*3/uL — AB (ref 4.0–10.5)

## 2017-07-12 LAB — RENAL FUNCTION PANEL
ANION GAP: 15 (ref 5–15)
Albumin: 2.6 g/dL — ABNORMAL LOW (ref 3.5–5.0)
BUN: 33 mg/dL — ABNORMAL HIGH (ref 6–20)
CHLORIDE: 94 mmol/L — AB (ref 101–111)
CO2: 24 mmol/L (ref 22–32)
Calcium: 8.7 mg/dL — ABNORMAL LOW (ref 8.9–10.3)
Creatinine, Ser: 7.11 mg/dL — ABNORMAL HIGH (ref 0.61–1.24)
GFR calc non Af Amer: 8 mL/min — ABNORMAL LOW (ref >60)
GFR, EST AFRICAN AMERICAN: 9 mL/min — AB (ref >60)
Glucose, Bld: 121 mg/dL — ABNORMAL HIGH (ref 65–99)
Phosphorus: 5.7 mg/dL — ABNORMAL HIGH (ref 2.5–4.6)
Potassium: 4.8 mmol/L (ref 3.5–5.1)
Sodium: 133 mmol/L — ABNORMAL LOW (ref 135–145)

## 2017-07-12 LAB — PROTIME-INR
INR: 6.81 — AB
PROTHROMBIN TIME: 58.6 s — AB (ref 11.4–15.2)

## 2017-07-12 LAB — LACTATE DEHYDROGENASE: LDH: 246 U/L — ABNORMAL HIGH (ref 98–192)

## 2017-07-12 SURGERY — EGD (ESOPHAGOGASTRODUODENOSCOPY)
Anesthesia: Monitor Anesthesia Care

## 2017-07-12 MED ORDER — AMIODARONE LOAD VIA INFUSION
150.0000 mg | Freq: Once | INTRAVENOUS | Status: AC
  Administered 2017-07-12: 150 mg via INTRAVENOUS

## 2017-07-12 MED ORDER — AMIODARONE HCL IN DEXTROSE 360-4.14 MG/200ML-% IV SOLN
60.0000 mg/h | INTRAVENOUS | Status: AC
  Administered 2017-07-12: 60 mg/h via INTRAVENOUS
  Filled 2017-07-12: qty 200

## 2017-07-12 MED ORDER — ERYTHROMYCIN ETHYLSUCCINATE 400 MG/5ML PO SUSR
400.0000 mg | Freq: Four times a day (QID) | ORAL | Status: DC
  Administered 2017-07-12: 400 mg via ORAL
  Filled 2017-07-12 (×5): qty 5

## 2017-07-12 MED ORDER — ATORVASTATIN CALCIUM 40 MG PO TABS
40.0000 mg | ORAL_TABLET | Freq: Every day | ORAL | Status: DC
  Administered 2017-07-12 – 2017-07-15 (×4): 40 mg via ORAL
  Filled 2017-07-12 (×4): qty 1

## 2017-07-12 MED ORDER — HEPARIN SODIUM (PORCINE) 1000 UNIT/ML DIALYSIS
100.0000 [IU]/kg | INTRAMUSCULAR | Status: DC | PRN
  Filled 2017-07-12: qty 8

## 2017-07-12 MED ORDER — METOCLOPRAMIDE HCL 5 MG/ML IJ SOLN
5.0000 mg | Freq: Two times a day (BID) | INTRAMUSCULAR | Status: DC
  Administered 2017-07-12 – 2017-07-13 (×3): 5 mg via INTRAVENOUS
  Filled 2017-07-12: qty 2
  Filled 2017-07-12: qty 1
  Filled 2017-07-12 (×2): qty 2

## 2017-07-12 MED ORDER — ERYTHROMYCIN LACTOBIONATE 500 MG IV SOLR
250.0000 mg | Freq: Four times a day (QID) | INTRAVENOUS | Status: DC
  Filled 2017-07-12: qty 5

## 2017-07-12 MED ORDER — PROMETHAZINE HCL 25 MG/ML IJ SOLN
INTRAMUSCULAR | Status: AC
  Filled 2017-07-12: qty 1

## 2017-07-12 MED ORDER — SODIUM CHLORIDE 0.9 % IV SOLN
INTRAVENOUS | Status: DC | PRN
  Administered 2017-07-12: 09:00:00 via INTRAVENOUS

## 2017-07-12 MED ORDER — PROMETHAZINE HCL 25 MG/ML IJ SOLN
25.0000 mg | Freq: Once | INTRAMUSCULAR | Status: AC
  Administered 2017-07-12: 25 mg via INTRAVENOUS

## 2017-07-12 MED ORDER — PROPOFOL 500 MG/50ML IV EMUL
INTRAVENOUS | Status: DC | PRN
  Administered 2017-07-12: 50 ug/kg/min via INTRAVENOUS

## 2017-07-12 MED ORDER — BUTAMBEN-TETRACAINE-BENZOCAINE 2-2-14 % EX AERO
INHALATION_SPRAY | CUTANEOUS | Status: DC | PRN
  Administered 2017-07-12: 2 via TOPICAL

## 2017-07-12 MED ORDER — AMIODARONE HCL IN DEXTROSE 360-4.14 MG/200ML-% IV SOLN
30.0000 mg/h | INTRAVENOUS | Status: DC
  Administered 2017-07-13 (×2): 30 mg/h via INTRAVENOUS

## 2017-07-12 MED ORDER — PANTOPRAZOLE SODIUM 40 MG IV SOLR
40.0000 mg | INTRAVENOUS | Status: DC
  Administered 2017-07-13: 40 mg via INTRAVENOUS
  Filled 2017-07-12: qty 40

## 2017-07-12 MED ORDER — NITROGLYCERIN IN D5W 200-5 MCG/ML-% IV SOLN
2.0000 ug/min | INTRAVENOUS | Status: DC
  Administered 2017-07-12: 10 ug/min via INTRAVENOUS
  Filled 2017-07-12: qty 250

## 2017-07-12 MED ORDER — LORAZEPAM 2 MG/ML IJ SOLN
0.5000 mg | Freq: Four times a day (QID) | INTRAMUSCULAR | Status: DC | PRN
  Administered 2017-07-12 – 2017-07-14 (×2): 0.5 mg via INTRAVENOUS
  Filled 2017-07-12: qty 1

## 2017-07-12 NOTE — Progress Notes (Signed)
Patient ID: [name], male   DOB: 1966-07-03, 51 y.o.   MRN: [id number]  [facility] Progress Note   Assessment/ Plan:   1.  Nausea/vomiting: Symptoms improved and better controlled overnight. CT scan of the abdomen and pelvis showed a dense gallbladder and pleural/pericardial effusions. Unclear if he would benefit from HIDA scan--awaiting GI input. Will attempt to lower EDW further today at HD.  2.  ESRD:  Plan for routine HD today with efforts to try and lower his dry weight further (as an outpatient, EDW is 85kg---this AM he is 78kg). 3.  Hypertension/volume:  blood pressures improving with some relief of nausea/vomiting- monitor with UF. 4.  Anemia:  status post Aranesp for anemia-continue to monitor hemoglobin/hematocrit trend. No overt losses. 5.  Metabolic bone disease: Ca ok, Phos pending. Monitor for now. 6. Severe ischemic cardiomyopathy (s/p LVAD): Per cardiology, LVAD evaluation later today. 7. Type 2 DM 8. Crohn's Disease  Subjective:   Reports a better night with improved nausea and vomiting. CT scan showed pleural effusion and small pericardial effusion but without clear source of nausea/vomiting.   Objective:   BP 123/89 (BP Location: Left Arm)   Pulse 90   Temp 97.6 F (36.4 C) (Oral)   Resp 16   Ht 5' 8"  (1.727 m)   Wt 78 kg (171 lb 15.3 oz)   SpO2 99%   BMI 26.15 kg/m   Physical Exam: Gen: Comfortably resting in bed CVS: Regular bradycardia, mechanical heart sounds from LVAD Resp: Decreased breath sounds over bases-no distinct rales or rhonchi Abd: Soft, flat, nontender Ext: No lower extremity edema  Labs: BMET Recent Labs  Lab 07/10/17 1107 07/11/17 0958 07/12/17 0240  NA 133* 132* 133*  K 4.5 4.7 4.8  CL 96* 94* 94*  CO2 22 22 24   GLUCOSE 220* 156* 121*  BUN 49* 23* 33*  CREATININE 8.84* 5.60* 7.11*  CALCIUM 8.6* 8.6* 8.7*  PHOS  --  5.3* 5.7*   CBC Recent Labs  Lab 07/06/17 1134 07/10/17 1107 07/11/17 0958  07/12/17 0240  WBC 13.7* 10.1 11.8* 14.2*  NEUTROABS  --   --   --  12.0*  HGB 10.5* 9.1* 9.9* 10.4*  HCT 32.6* 28.5* 31.5* 32.2*  MCV 81.9 79.8 80.8 78.9  PLT 373 307 355 401*   Medications:    . amLODipine  5 mg Oral Daily  . aspirin  325 mg Oral Daily  . atorvastatin  80 mg Oral q1800  . darbepoetin (ARANESP) injection - DIALYSIS  100 mcg Intravenous Q Mon-HD  . docusate sodium  200 mg Oral Daily  . hydrALAZINE  20 mg Intravenous Q4H  . hydrALAZINE  37.5 mg Oral Q8H  . insulin aspart  0-15 Units Subcutaneous TID WC  . insulin aspart  0-5 Units Subcutaneous QHS  . metoCLOPramide (REGLAN) injection  5 mg Intravenous Q6H  . pantoprazole  40 mg Oral Daily  . promethazine  12.5 mg Intravenous Q8H  . sildenafil  20 mg Oral TID   Elmarie Shiley, MD 07/12/2017, 7:54 AM

## 2017-07-12 NOTE — Progress Notes (Signed)
1430 Came to see pt to walk. Pt is still very nauseated and not feeling well. Will hold ambulation at this time and continue to follow. Graylon Good RN BSN 07/12/2017 2:36 PM

## 2017-07-12 NOTE — Progress Notes (Signed)
Medication review for addition of erythromycin for treatment of gastroparesis.   No medication contraindications noted. Erythromycin inhibits metabolism of atorvastatin and amiodarone 1. Decreased atorvastatin dose  80>81m daily 2. Monitor QTc in setting of Erythromycin, metoclopramide and amiodarone. 3. Monitor LFTs with amiodarone closely - restart low dose amiodarone when n/v improved.  LBonnita NasutiPharm.D. CPP, BCPS Clinical Pharmacist [phone]/03/2017 11:10 AM

## 2017-07-12 NOTE — Anesthesia Preprocedure Evaluation (Addendum)
Anesthesia Evaluation  Patient identified by MRN, date of birth, ID band Patient awake    Reviewed: Allergy & Precautions, H&P , NPO status , Patient's Chart, lab work & pertinent test results  Airway Mallampati: II  TM Distance: >3 FB Neck ROM: Full    Dental no notable dental hx. (+) Edentulous Upper, Edentulous Lower, Dental Advisory Given   Pulmonary neg pulmonary ROS,    Pulmonary exam normal breath sounds clear to auscultation       Cardiovascular hypertension, Pt. on medications + CAD, + Past MI and +CHF  + Cardiac Defibrillator  Rhythm:Regular Rate:Normal  LVAD   Neuro/Psych negative neurological ROS  negative psych ROS   GI/Hepatic Neg liver ROS, GERD  ,  Endo/Other  diabetes, Type 1, Insulin Dependent  Renal/GU ESRF and DialysisRenal disease  negative genitourinary   Musculoskeletal   Abdominal   Peds  Hematology negative hematology ROS (+)   Anesthesia Other Findings   Reproductive/Obstetrics negative OB ROS                            Anesthesia Physical Anesthesia Plan  ASA: IV  Anesthesia Plan: MAC   Post-op Pain Management:    Induction: Intravenous  PONV Risk Score and Plan: 1 and Propofol infusion  Airway Management Planned: Nasal Cannula  Additional Equipment:   Intra-op Plan:   Post-operative Plan:   Informed Consent: I have reviewed the patients History and Physical, chart, labs and discussed the procedure including the risks, benefits and alternatives for the proposed anesthesia with the patient or authorized representative who has indicated his/her understanding and acceptance.   Dental advisory given  Plan Discussed with: CRNA  Anesthesia Plan Comments:         Anesthesia Quick Evaluation

## 2017-07-12 NOTE — Op Note (Signed)
[facility] Patient Name: [name] Procedure Date : 07/12/2017 MRN: [id number] Attending MD: Jerene Bears , MD Date of Birth: Apr 28, 1966 CSN: [id number] Age: 51 Admit Type: Inpatient Procedure:                Upper GI endoscopy Indications:              Nausea with vomiting Providers:                Lajuan Lines. Hilarie Fredrickson, MD, Cleda Daub, RN, Corliss Parish, Technician Referring MD:             Larey Dresser, MD Medicines:                Monitored Anesthesia Care Complications:            No immediate complications. Estimated Blood Loss:     Estimated blood loss: none. Procedure:                Pre-Anesthesia Assessment:                           - Prior to the procedure, a History and Physical                            was performed, and patient medications and                            allergies were reviewed. The patient's tolerance of                            previous anesthesia was also reviewed. The risks                            and benefits of the procedure and the sedation                            options and risks were discussed with the patient.                            All questions were answered, and informed consent                            was obtained. Prior Anticoagulants: The patient has                            taken Coumadin (warfarin), last dose was 2 days                            prior to procedure. ASA Grade Assessment: III - A                            patient with severe systemic disease. After  reviewing the risks and benefits, the patient was                            deemed in satisfactory condition to undergo the                            procedure.                           After obtaining informed consent, the endoscope was                            passed under direct vision. Throughout the                            procedure, the patient's blood pressure,  pulse, and                            oxygen saturations were monitored continuously. The                            EG-[id number] ([id number]) scope was introduced through the                            mouth, and advanced to the second part of duodenum.                            The upper GI endoscopy was accomplished without                            difficulty. The patient tolerated the procedure                            well. Scope In: Scope Out: Findings:      The examined esophagus was normal.      A 2 cm hiatal hernia was present.      Retained fluid was found in the gastric body consistent with       gastroparesis. This fluid was aspirated via the endoscope.      Normal mucosa was found in the entire examined stomach.      The examined duodenum was normal. Impression:               - Normal esophagus.                           - 2 cm hiatal hernia.                           - Retained gastric fluid, removed via the scope.                            Consistent with gastroparesis.                           - Normal mucosa was found in the entire stomach.                           -  Normal examined duodenum.                           - No evidence of inflammation, IBD, or ulcer                            disease in the proximal GI tract.                           - No specimens collected. Moderate Sedation:      N/A Recommendation:           - Return patient to hospital ward for ongoing care.                           - Continue present medications.                           - Reduce metoclopramide to 5 mg IV every 12 hours                            (max dose per pharmacy given renal disease). Can                            add erythromycin 250 mg PO liquid or IV every 6-8                            hours x 3-5 days if needed to enhance gastric                            motility. Promethazine can be continued as needed                            for refractory nausea/vomiting.  Lorazepam can also                            be considered for refractory nausea and vomiting.                           - Advance diet as tolerated. Procedure Code(s):        --- Professional ---                           [phone], Esophagogastroduodenoscopy, flexible,                            transoral; diagnostic, including collection of                            specimen(s) by brushing or washing, when performed                            (separate procedure) Diagnosis Code(s):        --- Professional ---  K44.9, Diaphragmatic hernia without obstruction or                            gangrene                           R11.2, Nausea with vomiting, unspecified CPT copyright 2016 American Medical Association. All rights reserved. The codes documented in this report are preliminary and upon coder review may  be revised to meet current compliance requirements. Jerene Bears, MD 07/12/2017 9:59:02 AM This report has been signed electronically. Number of Addenda: 0

## 2017-07-12 NOTE — Anesthesia Postprocedure Evaluation (Signed)
Anesthesia Post Note  Patient: Panayiotis E Miklas  Procedure(s) Performed: ESOPHAGOGASTRODUODENOSCOPY (EGD) (N/A )     Patient location during evaluation: PACU Anesthesia Type: MAC Level of consciousness: awake and alert Pain management: pain level controlled Vital Signs Assessment: post-procedure vital signs reviewed and stable Respiratory status: spontaneous breathing, nonlabored ventilation, respiratory function stable and patient connected to nasal cannula oxygen Cardiovascular status: stable and blood pressure returned to baseline Postop Assessment: no apparent nausea or vomiting Anesthetic complications: no    Last Vitals:  Vitals:   07/12/17 0950 07/12/17 1000  BP: 114/81 (!) 155/90  Pulse: 85 83  Resp: 17 11  Temp:    SpO2: 100% 100%    Last Pain:  Vitals:   07/12/17 0948  TempSrc: Oral  PainSc:                  Belina Mandile,W. EDMOND

## 2017-07-12 NOTE — Progress Notes (Signed)
VAD Coordinator Procedure Note:   Patient underwent endoscopy by Dr. Hilarie Fredrickson. Hemodynamics and VAD parameters monitored by anesthesia and myself throughout the procedure. MAPs were obtained with manual BP cuff on left arm and correlated with Doppler modified systolic pressure.    Pt received scheduled IV hydralazine and prn Phenergan 25 mg IV before going down for procedure. Also, prior to leaving, pt experienced nausea with dry heaving - NSR changed to aflutter. Dr. Aundra Dubin at bedside - Amio bolus given and gtt increased to 60 mg/hr.      Auto cuff(MAP): HR: Sat: Flow: PI: Power:     Speed:   Pre-procedure: 8:45  125/96 (105)  93 97 4.0 4.5 4.4      5600 9:15  136/94 (106)  90 98 4.0 4.6 4.2    Sedation Induction: 9:25  136/99 (110)  88 100 3.9 4.8 4.3 9:30  129/94 (106)  88 100 4.0 4.6 4.3 9:40  116/95 (102)  92 100 4.2 3.7 4.3   Recovery area: 9:45  123/77 (88)  87 100 4.0 3.5 4.2 10:00  114/81 (92)  4.4 3.3 4.4 3.3 4.4   Patient tolerated the procedure well. Per Dr. Hilarie Fredrickson - normal esophagus; retained gastric fluid consistent with gastroparesis.   Poor peripheral IV access; anesthesia accessed tunneled HD catheter for procedure. Post procedure, IV team to holding room - flushed HD catheter port and capped. IV team able to get additional access left arm (# 20 cath). Pt tolerated procedure well.   Patient Disposition: 2C06

## 2017-07-12 NOTE — Procedures (Signed)
Patient seen on Hemodialysis. QB 400, UF goal 2.5L net Treatment adjusted as needed.  Elmarie Shiley MD [facility]. Office # [phone] Pager # [phone] 3:07 PM

## 2017-07-12 NOTE — Transfer of Care (Signed)
Immediate Anesthesia Transfer of Care Note  Patient: [name]  Procedure(s) Performed: ESOPHAGOGASTRODUODENOSCOPY (EGD) (N/A )  Patient Location: Endoscopy Unit  Anesthesia Type:MAC  Level of Consciousness: drowsy  Airway & Oxygen Therapy: Patient Spontanous Breathing and Patient connected to nasal cannula oxygen  Post-op Assessment: Report given to RN and Post -op Vital signs reviewed and stable  Post vital signs: Reviewed and stable  Last Vitals:  Vitals:   07/12/17 0835 07/12/17 0948  BP: (!) 138/100 122/73  Pulse: 93 85  Resp: (!) 21 17  Temp: 36.7 C 36.9 C  SpO2: 98% 100%    Last Pain:  Vitals:   07/12/17 0948  TempSrc: Oral  PainSc:          Complications: No apparent anesthesia complications

## 2017-07-12 NOTE — Progress Notes (Signed)
ANTICOAGULATION CONSULT NOTE -Follow Up Consult  Pharmacy Consult for Warfarin Indication: LVAD  Allergies  Allergen Reactions  . Metformin And Related Diarrhea    Patient Measurements: Height: 5' 8"  (172.7 cm) Weight: 171 lb 15.3 oz (78 kg) IBW/kg (Calculated) : 68.4  Vital Signs: Temp: 98.5 F (36.9 C) (11/07 0948) Temp Source: Oral (11/07 0948) BP: 155/90 (11/07 1000) Pulse Rate: 83 (11/07 1000)  Labs: Recent Labs    07/10/17 1107 07/11/17 0801 07/11/17 0958 07/12/17 0240  HGB 9.1*  --  9.9* 10.4*  HCT 28.5*  --  31.5* 32.2*  PLT 307  --  355 401*  LABPROT 47.3* 49.4*  --  58.6*  INR 5.09* 5.47*  --  6.81*  CREATININE 8.84*  --  5.60* 7.11*    Estimated Creatinine Clearance: 11.8 mL/min (A) (by C-G formula based on SCr of 7.11 mg/dL (H)).   Medical History: Past Medical History:  Diagnosis Date  . Angina   . ASCVD (arteriosclerotic cardiovascular disease)    , Anterior infarction 2005, LAD diagonal bifurcation intervention 03/2004  . Automatic implantable cardiac defibrillator -St. Jude's       . Benign neoplasm of colon   . CHF (congestive heart failure) ([place])   . Chronic systolic heart failure ([place])   . Coronary artery disease     Widely patent previously placed stents in the left anterior   . Crohn's disease ([place])   . 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 Dyspnea   . Gastroparesis   . GERD (gastroesophageal reflux disease)   . High cholesterol   . Hyperlipidemia   . Hypersomnolent    Previous diagnosis of narcolepsy  . Hypertension, essential   . Ischemic cardiomyopathy    Ejection fraction 15-20% catheterization 2010  . Type II or unspecified type diabetes mellitus without mention of complication, not stated as uncontrolled   . Unspecified gastritis and gastroduodenitis without mention of hemorrhage      Assessment: LVAD and CKD on HD admitted wirh HTN, N/V.  INR 5 on admit > 6.87 despite holding warfarin, LDH stable  200s, no bleeding, CBC stable.     Goal of Therapy:  INR 2-3 Monitor platelets by anticoagulation protocol: Yes   Plan:  Continue to hold Warfarin until INR starts trending down Daily INR  Bonnita Nasuti Pharm.D. CPP, BCPS Clinical Pharmacist [phone] 07/12/2017 11:00 AM

## 2017-07-12 NOTE — Progress Notes (Signed)
Patient ID: [name], male   DOB: [birth year], 51 y.o.   MRN: [id number]   Advanced Heart Failure VAD Team Note  Subjective:    [name] is a 51 y.o. male with history of CAD s/p anterior MI in 2010 and NSTEMI in 3/18 with DES to pRCA and mLAD and ischemic cardiomyopathy now s/p Heartmate 3 LVAD and ESRD   He was admitted on 11/5 with about 24 hrs of intractable nausea/vomiting.  He had HD 11/5 in hospital, weight down.  Discussed with Dr. Posey Pronto and do not suspect volume overload (he has been getting down to dry weight with HD).   CT abdomen/pelvis did not show definite pathology though gallbladder not well-visualized.   He went into atrial flutter with RVR 11/6 in the morning while vomiting.  He went back into NSR on amiodarone gtt, then atrial flutter recurred this morning again with vomiting.    MAP continues to run high despite IV hydralazine.   He continues to be nauseated today though he says that he feels better than on 11/6. He has not had any pos.  INR up to 6.8 earlier this morning. LDH stable at 246.    LVAD INTERROGATION:  HeartMate II LVAD:  Flow 4.1 liters/min, speed 5600, power 4.3, PI 4.8.  1 PI event    Objective:    Vital Signs:   Temp:  [97.6 F (36.4 C)-98.8 F (37.1 C)] 98.5 F (36.9 C) (11/07 0804) Pulse Rate:  [30-95] 30 (11/07 0804) Resp:  [14-21] 16 (11/07 0804) BP: (110-148)/(82-103) 134/99 (11/07 0804) SpO2:  [95 %-99 %] 98 % (11/07 0804) Weight:  [171 lb 15.3 oz (78 kg)] 171 lb 15.3 oz (78 kg) (11/07 0355) Last BM Date: 07/09/17 Mean arterial Pressure 100s-110s  Intake/Output:   Intake/Output Summary (Last 24 hours) at 07/12/2017 0816 Last data filed at 07/12/2017 0000 Gross per 24 hour  Intake 351.28 ml  Output -  Net 351.28 ml     Physical Exam    GENERAL: NAD.  HEENT: Normal. NECK: Supple, JVP 9-10 cm. Carotids OK.  CARDIAC:  Mechanical heart sounds with LVAD hum present.  LUNGS:  CTAB, normal effort.  ABDOMEN:   Nontender, ND, no HSM. No bruits or masses. +BS  LVAD exit site: Well-healed and incorporated. Dressing dry and intact. No erythema or drainage. Stabilization device present and accurately applied. Driveline dressing changed daily per sterile technique. EXTREMITIES:  Warm and dry. No cyanosis, clubbing, rash, or edema.  NEUROLOGIC:  Alert & oriented x 3. Cranial nerves grossly intact. Moves all 4 extremities w/o difficulty. Affect pleasant     Telemetry   Atrial flutter in 110s, personally reviewed.    Labs   Basic Metabolic Panel: Recent Labs  Lab 07/10/17 1107 07/11/17 0958 07/12/17 0240  NA 133* 132* 133*  K 4.5 4.7 4.8  CL 96* 94* 94*  CO2 22 22 24   GLUCOSE 220* 156* 121*  BUN 49* 23* 33*  CREATININE 8.84* 5.60* 7.11*  CALCIUM 8.6* 8.6* 8.7*  PHOS  --  5.3* 5.7*    Liver Function Tests: Recent Labs  Lab 07/10/17 1107 07/11/17 0958 07/12/17 0240  AST 26  --   --   ALT 15*  --   --   ALKPHOS 135*  --   --   BILITOT 0.5  --   --   PROT 7.7  --   --   ALBUMIN 2.7* 2.6* 2.6*   Recent Labs  Lab 07/11/17 0801  AMYLASE  141*   No results for input(s): AMMONIA in the last 168 hours.  CBC: Recent Labs  Lab 07/06/17 1134 07/10/17 1107 07/11/17 0958 07/12/17 0240  WBC 13.7* 10.1 11.8* 14.2*  NEUTROABS  --   --   --  12.0*  HGB 10.5* 9.1* 9.9* 10.4*  HCT 32.6* 28.5* 31.5* 32.2*  MCV 81.9 79.8 80.8 78.9  PLT 373 307 355 401*    INR: Recent Labs  Lab 07/06/17 1134 07/10/17 1107 07/11/17 0801 07/12/17 0240  INR 3.83 5.09* 5.47* 6.81*    Other results:  EKG:    Imaging   Ct Abdomen Pelvis Wo Contrast  Result Date: 07/11/2017 CLINICAL DATA:  51 year old male with abdominal pain/intractable nausea and vomiting. Ischemic cardiomyopathy with left ventricular assist device in place. End-stage renal disease. Prior appendectomy. Initial encounter. EXAM: CT ABDOMEN AND PELVIS WITHOUT CONTRAST TECHNIQUE: Multidetector CT imaging of the abdomen and pelvis  was performed following the standard protocol without IV contrast. COMPARISON:  06/27/2017 chest CT. 04/28/2017 CT of the abdomen and pelvis. FINDINGS: Lower chest: Small to slightly moderate-size left-sided pleural effusion. Basilar atelectasis greater on left. Cardiomegaly. Trace pericardial fluid. Left ventricular assist device and AICD/ pacer in place. Hepatobiliary: Taking into account limitation by non contrast imaging, no worrisome hepatic lesion. Dense appearance of the gallbladder may represent vicarious excretion of contrast. Pancreas: Taking into account limitation by non contrast imaging, no pancreatic mass or inflammation. Spleen: Taking into account limitation by non contrast imaging, no splenic mass or enlargement. Adrenals/Urinary Tract: No obstructing stone or hydronephrosis. Taking into account limitation by non contrast imaging, no renal or adrenal mass. Partially decompressed urinary bladder with circumferential wall thickening without polypoid lesion noted. Stomach/Bowel: Postsurgical changes right colon. Portions of bowel under distended however, no extraluminal bowel inflammatory process detected. Duodenal diverticulum incidentally noted. Vascular/Lymphatic: Vascular calcifications without aortic aneurysm. No adenopathy. Reproductive: Negative. Other: No free intraperitoneal air. Musculoskeletal: Schmorl's node deformity inferior endplate L1. No acute osseous abnormality. IMPRESSION: Postsurgical changes right colon. Portions of bowel are under distended however no extraluminal bowel inflammatory process is noted. Dense gallbladder may represent vicarious excretion of contrast. If primary gallbladder abnormality were of concern, ultrasound may then be considered. Cardiomegaly with trace pericardial fluid. Left ventricular assist device and pacemaker in place. Small to moderate-size left-sided pleural effusion with basilar atelectasis greater on left. Aortic Atherosclerosis (ICD10-I70.0).  Electronically Signed   By: Genia Del M.D.   On: 07/11/2017 11:28     Medications:     Scheduled Medications: . amiodarone  150 mg Intravenous Once  . amLODipine  5 mg Oral Daily  . atorvastatin  80 mg Oral q1800  . darbepoetin (ARANESP) injection - DIALYSIS  100 mcg Intravenous Q Mon-HD  . docusate sodium  200 mg Oral Daily  . hydrALAZINE  20 mg Intravenous Q4H  . hydrALAZINE  37.5 mg Oral Q8H  . insulin aspart  0-15 Units Subcutaneous TID WC  . insulin aspart  0-5 Units Subcutaneous QHS  . metoCLOPramide (REGLAN) injection  5 mg Intravenous Q6H  . pantoprazole  40 mg Oral Daily  . promethazine  12.5 mg Intravenous Q8H    Infusions: . amiodarone 30 mg/hr (07/11/17 2006)  . amiodarone     Followed by  . amiodarone    . nitroGLYCERIN      PRN Medications: acetaminophen, hydrALAZINE, ondansetron (ZOFRAN) IV, ondansetron, promethazine   Patient Profile   Ross E Savas is a 51 y.o. male with history of CAD s/p anterior MI in 2010  and NSTEMI in 3/18 with DES to pRCA and mLAD and ischemic cardiomyopathy now s/p Heartmate 3 LVAD and ESRD.   Admitted from clinic 07/10/17 with N/V with active vomiting and dry heaves as well as HTN.   Assessment/Plan:    1. Nausea/vomiting: Patient has a prior history of this when volume overloaded though weight was only up 1 lb since 11/1 at admission, and he did not seem to have improved much with fluid off at HD on 11/5.  Per nephrology, he has been near/at his dry weight.  He has a history of diabetic gastroparesis which has led to severe GI symptoms in the past.  He additionally has Crohns disease but this has been quiescent.  No diarrhea, melena, BRBPR.  LFTs are normal.  Abdomen is nontender today and he is afebrile.  CT abdomen/pelvis did not show definite abnormality though GB not well-visualized.  - Hold po meds for now until nausea/vomiting resolves.  - Will give IV Reglan and Zofran for symptomatic treatment.  - He says that  Ativan has helped with nausea in the past, will order.    - GI has seen, plan for EGD today (diagnostic b/c INR high).    - IV protonix.  - Will get RUQ [place] for better evaluation of gallbladder.  2. HTN: MAP remains elevated, this has been complicated to control given HD.  However, needs good control for LVAD function. He is not taking po meds currently due to nausea/vomiting.  - Continue hydralazine 20 gm IV every 4 hrs. Will resume previous po dose (Recently increased) as tolerated (hydralazine 37.5 mg tid).  - He is likely going to need an additional medication.  Will start amlodipine 5 mg daily (rather than ARB or Entresto as still slight hope for renal recovery) when he can take po.  - MAP > 100 still, will add NTG gtt to be titrated to MAP < 90, can stop when he can take po meds again.  Will need additional IV access, will have IV team see him.  Place PICC if cannot get IV.  3. ESRD: Had HD on 11/5. Renal following.  ?Symptoms due to volume overload and need to lower dry weight, but he did not improve much with HD on 11/5.  He will have HD later today as well.   4. Chronic systolic CHF: Ischemic cardiomyopathy. St Jude ICD. NSTEMI in March 2018 with DES to LAD and RCA, complicated by cardiogenic shock, low output requiring milrinone.Echo 8/18 with EF 20-25%, moderate MR. PX (8/18) with severe functional impairment due to HF.He is now s/p HM3 LVAD placement. - Discussed volume status with renal => Dr. Posey Pronto thinks that he is hitting his dry weight without problems.  - LVAD parameters reviewed today and stable despite high BP.    - MAP remains elevated as above => work on BP control.  - Given heterozygosity for factor V Leiden and history of clotting, he has been on ASA at 325 mg daily and coumadin with INR 2-3 goal.INR 6.8.  Hold warfarin today and will also hold ASA until INR back down to goal range.    - Continue sildenafil for RV support when he can take po.  5. CAD: NSTEMI in 3/18.  LHC with 99% ulcerated lesion proximal RCA with left to right collaterals, 95% mid LAD stenosis after mid LAD stent. s/p PCI to RCA and LAD on 11/25/16.  6. h/o DVTs:Anticoagulated.Dosing per pharm.  7.Diabetic gastroparesis: Suspect this contributes to symptoms.   8.  DM: Sliding scale for now.  9. PAD: Moderate bilateral disease on ABIs in 8/18. Denies claudication currently. No change.  10. Atrial flutter: with RVR, developed after vomiting 11/5 am then resolved on amiodarone.  Restarted this morning with vomiting again.  - Re-bolus with amiodarone and increase to 60 mg/hr x 6 hrs then 30 mg/hr.    I reviewed the LVAD parameters from today, and compared the results to the patient's prior recorded data.  No programming changes were made.  The LVAD is functioning within specified parameters.  The patient performs LVAD self-test daily.  LVAD interrogation was negative for any significant power changes, alarms or PI events/speed drops.  LVAD equipment check completed and is in good working order.  Back-up equipment present.   LVAD education done on emergency procedures and precautions and reviewed exit site care.  Length of Stay: 2  Loralie Champagne, MD 07/12/2017, 8:16 AM  VAD Team --- VAD ISSUES ONLY--- Pager [phone] (7am - 7am)  Advanced Heart Failure Team  Pager [phone] (M-F; 7a - 4p)  Please contact [facility] for night-coverage after hours (4p -7a ) and weekends on [email or web address]

## 2017-07-12 NOTE — Anesthesia Procedure Notes (Signed)
Procedure Name: MAC Date/Time: 07/12/2017 9:18 AM Performed by: Kyung Rudd, CRNA Pre-anesthesia Checklist: Patient identified, Emergency Drugs available, Suction available and Patient being monitored Patient Re-evaluated:Patient Re-evaluated prior to induction Oxygen Delivery Method: Nasal cannula Preoxygenation: Pre-oxygenation with 100% oxygen Induction Type: IV induction Placement Confirmation: positive ETCO2 Dental Injury: Teeth and Oropharynx as per pre-operative assessment

## 2017-07-12 NOTE — Care Management Note (Addendum)
Case Management Note Patient Details  Name: [name] MRN: [id number] Date of Birth: Apr 11, 1966  Subjective/Objective:     Pt admitted with GI bleed - s/p EGD  Action/Plan:  Pt has hx of LVAD and outp HD.  Pt is active with [facility] for HHPT, OT - agency aware of admit. Pt has PCP and active prescription coverage.   CM will continue to follow for discharge needs  Expected Discharge Date:                  Expected Discharge Plan:     In-House Referral:     Discharge planning Services     Post Acute Care Choice:    Choice offered to:     DME Arranged:    DME Agency:     HH Arranged:    HH Agency:     Status of Service:     If discussed at [employer] of Stay Meetings, dates discussed:    Additional Comments: 07/12/2017  Maryclare Labrador, RN 07/12/2017, 3:34 PM

## 2017-07-12 NOTE — Progress Notes (Signed)
CRITICAL VALUE ALERT  Critical Value: INR 6.8  Date & Time Notied:  07/12/17  0340  Provider Notified: On call [place]  Orders Received/Actions taken: No signs or bleeding.Verbal order with read back received for repeat INR at 7am. Will continue to monitor.

## 2017-07-12 NOTE — Interval H&P Note (Signed)
History and Physical Interval Note: For EGD today with MAC He is still having significant N/V, primarily dry heaves, as he has had little to no PO intake. INR is up further to 6.7 We will plan diagnostic only EGD given very elevated INR to rule out structural lesion, severe inflammation, ulcer disease as cause of symptoms. He will have HD again today, last was Monday Zada Girt, LVAD RN will be present for this procedure. I have discussed with anesthesia. HIGHER THAN BASELINE RISK.The nature of the procedure, as well as the risks, benefits, and alternatives were carefully and thoroughly reviewed with the patient. Ample time for discussion and questions allowed. The patient understood, was satisfied, and agreed to proceed.    07/12/2017 9:10 AM  [name]  has presented today for surgery, with the diagnosis of Nausea, vomiting, history of diabetic paresis.  LVAD placed 06/12/17.  The various methods of treatment have been discussed with the patient and family. After consideration of risks, benefits and other options for treatment, the patient has consented to  Procedure(s) with comments: ESOPHAGOGASTRODUODENOSCOPY (EGD) (N/A) - VAD pt so VAD team will need to accompany pt.  as a surgical intervention .  The patient's history has been reviewed, patient examined, no change in status, stable for surgery.  I have reviewed the patient's chart and labs.  Questions were answered to the patient's satisfaction.     [name]

## 2017-07-12 NOTE — Progress Notes (Signed)
LVAD Coordinator Rounding Note:  Admitted 07/11/17 for intractable nausea/vomiting and hypertension.   HM3LVAD implanted on 10/8/2018by Dr Elpidio [name] Destination Therapy criteria due to renal insufficiency.  Vital signs: Temp: 98.1 HR:  85 Doppler:  Auto cuff:  O2 Sat: 98% on RA  Wt:171>171>171lbs   LVAD interrogation reveals:  Speed: 5600 Flow:  4.1 Power: 4.3w PI: 5.0 Alarms: none Events: none Hematocrit: 32 Fixed speed: 5600 Low speed limit: 5300   Drive Line: Daily. Change twice a week. Due Thursday 11/8/.   Labs:  LDH trend: [phone]  INR trend: 5.09>5.47>6.8  Hgb trend: 9.1>9.9>10.4  Anticoagulation Plan: -INR Goal: 2-3 -ASA Dose: 320m daily - placed on hold 07/12/17  Gtts: Amio @ 30 mg/hr (started 07/11/17 for Aflutter with RVR)  Device: -St Jude dual ICD -Therapies: ON at 200 bpm   Renal:  Creat trend: 8.84>5.60>7.11 HD - MWF  Significant Events on Support: CT of abd for increase N/V 07/11/17  Plan/Recommendations:  1. Continue to manage pts N/V and HTN with IV meds. Dr. MAundra Dubinadding prn Ativan - pt has used in the past with some relief. 2.  IV access problematic - nurse will call IV team. 3.  VAD coordinator will accompany pt to endoscopy. 4.  Call VAD pager for any VAD equipment issues.   MZada GirtRN, VAD Coordinator 24/7 pager [phone]

## 2017-07-12 NOTE — Progress Notes (Signed)
See EGD report. Discussed with Dr. Aundra Dubin and we decided to try erythromycin for gastroparesis, N/V Pharmacy called to make me aware IV erythromycin is on backorder and not available. Will try PO liquid 250 mg every 6 hours (if he can tolerate) Will also continue with the reglan at his max dose, scheduled promethazine, and IV ativan and/or zofran for backup PRN. If he improves would remove the scheduled promethazine first and continue reglan and erythromycin until these can be removed.   He will likely benefit from long-term reglan for now

## 2017-07-13 ENCOUNTER — Inpatient Hospital Stay (HOSPITAL_COMMUNITY): Payer: BLUE CROSS/BLUE SHIELD

## 2017-07-13 ENCOUNTER — Encounter (HOSPITAL_COMMUNITY): Payer: Self-pay

## 2017-07-13 DIAGNOSIS — R1011 Right upper quadrant pain: Secondary | ICD-10-CM

## 2017-07-13 LAB — CBC WITH DIFFERENTIAL/PLATELET
BASOS ABS: 0 10*3/uL (ref 0.0–0.1)
Basophils Relative: 0 %
EOS ABS: 0 10*3/uL (ref 0.0–0.7)
Eosinophils Relative: 0 %
HEMATOCRIT: 33.1 % — AB (ref 39.0–52.0)
HEMOGLOBIN: 10.7 g/dL — AB (ref 13.0–17.0)
Lymphocytes Relative: 9 %
Lymphs Abs: 0.8 10*3/uL (ref 0.7–4.0)
MCH: 26 pg (ref 26.0–34.0)
MCHC: 32.3 g/dL (ref 30.0–36.0)
MCV: 80.5 fL (ref 78.0–100.0)
Monocytes Absolute: 0.9 10*3/uL (ref 0.1–1.0)
Monocytes Relative: 9 %
NEUTROS ABS: 8 10*3/uL — AB (ref 1.7–7.7)
NEUTROS PCT: 82 %
Platelets: 337 10*3/uL (ref 150–400)
RBC: 4.11 MIL/uL — AB (ref 4.22–5.81)
RDW: 17.1 % — ABNORMAL HIGH (ref 11.5–15.5)
WBC: 9.8 10*3/uL (ref 4.0–10.5)

## 2017-07-13 LAB — RENAL FUNCTION PANEL
ANION GAP: 15 (ref 5–15)
Albumin: 2.5 g/dL — ABNORMAL LOW (ref 3.5–5.0)
BUN: 16 mg/dL (ref 6–20)
CALCIUM: 8.3 mg/dL — AB (ref 8.9–10.3)
CO2: 23 mmol/L (ref 22–32)
CREATININE: 4.97 mg/dL — AB (ref 0.61–1.24)
Chloride: 96 mmol/L — ABNORMAL LOW (ref 101–111)
GFR, EST AFRICAN AMERICAN: 14 mL/min — AB (ref >60)
GFR, EST NON AFRICAN AMERICAN: 12 mL/min — AB (ref >60)
Glucose, Bld: 100 mg/dL — ABNORMAL HIGH (ref 65–99)
PHOSPHORUS: 4.4 mg/dL (ref 2.5–4.6)
Potassium: 3.9 mmol/L (ref 3.5–5.1)
SODIUM: 134 mmol/L — AB (ref 135–145)

## 2017-07-13 LAB — GLUCOSE, CAPILLARY
GLUCOSE-CAPILLARY: 115 mg/dL — AB (ref 65–99)
GLUCOSE-CAPILLARY: 112 mg/dL — AB (ref 65–99)
GLUCOSE-CAPILLARY: 127 mg/dL — AB (ref 65–99)
Glucose-Capillary: 227 mg/dL — ABNORMAL HIGH (ref 65–99)

## 2017-07-13 LAB — LACTATE DEHYDROGENASE: LDH: 268 U/L — ABNORMAL HIGH (ref 98–192)

## 2017-07-13 LAB — PROTIME-INR
INR: 7.13 — AB
Prothrombin Time: 60.8 seconds — ABNORMAL HIGH (ref 11.4–15.2)

## 2017-07-13 MED ORDER — ERYTHROMYCIN BASE 250 MG PO TABS
250.0000 mg | ORAL_TABLET | Freq: Three times a day (TID) | ORAL | Status: DC
  Administered 2017-07-13 – 2017-07-16 (×9): 250 mg via ORAL
  Filled 2017-07-13 (×12): qty 1

## 2017-07-13 NOTE — Progress Notes (Signed)
[facility] Progress Note  Chief Complaint:    Nausea and vomiting  Subjective: He just woke up. Hasn't tried to eat yet. Got one dose of Erythromycin last night and just got a second dose. He slept through the night, no vomiting.   Objective:  Vital signs in last 24 hours: Temp:  [98.3 F (36.8 C)-99.5 F (37.5 C)] 98.7 F (37.1 C) (11/08 0707) Pulse Rate:  [58-101] 58 (11/08 0350) Resp:  [11-23] 18 (11/08 0350) BP: (89-155)/(57-104) 136/104 (11/08 0820) SpO2:  [95 %-100 %] 97 % (11/08 0350) Weight:  [170 lb (77.1 kg)-177 lb 14.6 oz (80.7 kg)] 170 lb (77.1 kg) (11/08 0350) Last BM Date: 07/09/17 General:   Alert, well-developed, black male in NAD EENT:  Normal hearing, non icteric sclera, conjunctive pink.  Heart:  LVAD hum Pulm: Normal respiratory effort Abdomen:  Soft, nondistended, nontender.  Normal bowel sounds, no masses felt. No hepatomegaly.    Neurologic:  Alert and  oriented x4;  grossly normal neurologically. Psych:  Pleasant, cooperative.  Normal mood and affect.   Intake/Output from previous day: 11/07 0701 - 11/08 0700 In: 166.1 [I.V.:166.1] Out: 5000  Intake/Output this shift: No intake/output data recorded.  Lab Results: Recent Labs    07/11/17 0958 07/12/17 0240 07/13/17 0312  WBC 11.8* 14.2* 9.8  HGB 9.9* 10.4* 10.7*  HCT 31.5* 32.2* 33.1*  PLT 355 401* 337   BMET Recent Labs    07/11/17 0958 07/12/17 0240 07/13/17 0312  NA 132* 133* 134*  K 4.7 4.8 3.9  CL 94* 94* 96*  CO2 22 24 23   GLUCOSE 156* 121* 100*  BUN 23* 33* 16  CREATININE 5.60* 7.11* 4.97*  CALCIUM 8.6* 8.7* 8.3*   LFT Recent Labs    07/10/17 1107  07/13/17 0312  PROT 7.7  --   --   ALBUMIN 2.7*   < > 2.5*  AST 26  --   --   ALT 15*  --   --   ALKPHOS 135*  --   --   BILITOT 0.5  --   --   BILIDIR <0.1*  --   --   IBILI NOT CALCULATED  --   --    < > = values in this interval not displayed.   PT/INR Recent Labs    07/12/17 0240  07/13/17 0312  LABPROT 58.6* 60.8*  INR 6.81* 7.13*   Hepatitis Panel No results for input(s): HEPBSAG, HCVAB, HEPAIGM, HEPBIGM in the last 72 hours.  US Abdomen Limited Ruq  Result Date: 07/12/2017 CLINICAL DATA:  Nausea and vomiting. History of hypertension, diabetes, GERD. EXAM: ULTRASOUND ABDOMEN LIMITED RIGHT UPPER QUADRANT COMPARISON:  None. FINDINGS: Gallbladder: No gallstones or wall thickening visualized. No sonographic Murphy sign noted by sonographer. Common bile duct: Diameter: 3 mm Liver: No focal lesion identified. Within normal limits in parenchymal echogenicity. Portal vein is patent on color Doppler imaging with normal direction of blood flow towards the liver. IMPRESSION: Normal right upper quadrant ultrasound. Electronically Signed   By: Franki Cabot M.D.   On: 07/12/2017 11:55    ASSESSMENT / PLAN:   51 yo male with multiple medical problems admitted with acute on chronic nausea and vomiting, known gastroparesis. Non-contrast CT scan unrevealing. RUQ u/s yesterday was unremarkable. EGD yesterday >>retained fluid in stomach c/w gastroparesis. Getting scheduled IV Reglan, scheduled promethazine and IV ativan / Zofran as needed.  -We added PO Erythromycin yesterday ( IV on back order). Will see how he does.  He just woke up so hasn't tried to eat yet   Principal Problem:   LVAD (left ventricular assist device) present [facility]) Active Problems:   Nausea & vomiting   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) ([place])    LOS: 3 days   Tye Savoy ,NP 07/13/2017, 9:43 AM  Pager number [phone]

## 2017-07-13 NOTE — Progress Notes (Signed)
CRITICAL VALUE ALERT  Critical Value:  INR 7.13  Date & Time Notied:  07/13/17 4:09 am  Provider Notified:  Heart failure on call, text page  Orders Received/Actions taken: awaiting orders

## 2017-07-13 NOTE — Progress Notes (Signed)
LVAD Coordinator Rounding Note:  Admitted 07/11/17 for intractable nausea/vomiting and hypertension.   HM3LVAD implanted on 10/8/2018by Dr Elpidio [name] Destination Therapy criteria due to renal insufficiency.  Vital signs: Temp: 98.7 HR:  99 Doppler:  Auto cuff:  O2 Sat: 97% on RA  Wt:179>171>172>170 lbs   LVAD interrogation reveals:  Speed: 5600 Flow:  4.1 Power: 4.2w PI: 4.0 Alarms: none Events: none Hematocrit: 32 Fixed speed: 5600 Low speed limit: 5300   Drive Line: Daily. Change twice a week. Due Thursday 07/13/17.   Labs:  LDH trend: 255>383>246>268  INR trend: 5.09>5.47>6.8>7.13  Hgb trend: 9.1>9.9>10.4  Anticoagulation Plan: -INR Goal: 2-3 -ASA Dose: 368m daily - placed on hold 07/12/17  Gtts: Amio @ 30 mg/hr (started 07/11/17 for Aflutter with RVR) Ntg: 5 mcg/min  Device: -St Jude dual ICD -Therapies: ON at 200 bpm   Renal:  Creat trend: 8.84>5.60>7.11>4.97 HD - MWF  Significant Events on Support: CT of abd for increase N/V 07/11/17  Plan/Recommendations:  1. Continue to manage pts N/V and HTN with IV meds. Dr. MAundra Dubinadding prn Ativan - pt has used in     the past with some relief. 2.  IV team started second peripheral IV yesterday for IV Ntg for BP management 3.  VAD coordinator can assist with dressing change today if needed.  4.  Call VAD pager for any VAD equipment issues.   MZada GirtRN, VAD Coordinator 24/7 pager [phone]

## 2017-07-13 NOTE — Progress Notes (Signed)
PT Cancellation Note  Patient Details Name: [name] MRN: [id number] DOB: 1966-03-25   Cancelled Treatment:    Reason Eval/Treat Not Completed: Other (comment). Pt just amb 600'+ with nursing. Will check on tomorrow. May not need skilled PT   Shary Decamp [facility] 07/13/2017, 3:21 PM [place]

## 2017-07-13 NOTE — Progress Notes (Signed)
Patient ID: [name], male   DOB: [birth year], 51 y.o.   MRN: [id number]   Advanced Heart Failure VAD Team Note  Subjective:    [name] is a 51 y.o. male with history of CAD s/p anterior MI in 2010 and NSTEMI in 3/18 with DES to pRCA and mLAD and ischemic cardiomyopathy now s/p Heartmate 3 LVAD and ESRD   He was admitted on 11/5 with about 24 hrs of intractable nausea/vomiting.  He had HD 11/5 in hospital, weight down.  Discussed with Dr. Posey Pronto and do not suspect volume overload (he has been getting down to dry weight with HD).   CT abdomen/pelvis did not show definite pathology though gallbladder not well-visualized.  RUQ [place] normal.   He went into atrial flutter with RVR 11/6 in the morning while vomiting.  He is now in NSR on amiodarone.   MAP lower now in 90s on NTG gtt at 5 currently.   EGD (11/7): Retained fluid, concern for gastroparesis.  Erythromycin started but unable to tolerate more than 1 dose of liquid due to nausea.   HD yesterday, below dry weight per Dr. Posey Pronto.   Walked in room.   This morning he is sleeping.  Has not had anti-nausea meds since yesterday evening. INR still rising to 7.1 today.  No overt bleeding and hgb stable.   LVAD INTERROGATION:  HeartMate II LVAD:  Flow 4.1 liters/min, speed 5600, power 4.2, PI 4.0.  2 PI events   Objective:    Vital Signs:   Temp:  [98.1 F (36.7 C)-99.5 F (37.5 C)] 98.7 F (37.1 C) (11/08 0707) Pulse Rate:  [58-101] 58 (11/08 0350) Resp:  [11-23] 18 (11/08 0350) BP: (89-155)/(57-100) 97/70 (11/08 0350) SpO2:  [95 %-100 %] 97 % (11/08 0350) Weight:  [170 lb (77.1 kg)-177 lb 14.6 oz (80.7 kg)] 170 lb (77.1 kg) (11/08 0350) Last BM Date: 07/09/17 Mean arterial Pressure 100s-110s  Intake/Output:   Intake/Output Summary (Last 24 hours) at 07/13/2017 0817 Last data filed at 07/13/2017 0600 Gross per 24 hour  Intake 166.13 ml  Output 5000 ml  Net -4833.87 ml     Physical Exam    GENERAL: NAD,  sleeping HEENT: Normal. NECK: Supple, JVP 7-8 cm. Carotids OK.  CARDIAC:  Mechanical heart sounds with LVAD hum present.  LUNGS:  CTAB, normal effort.  ABDOMEN:  NT, ND, no HSM. No bruits or masses. +BS  LVAD exit site: Well-healed and incorporated. Dressing dry and intact. No erythema or drainage. Stabilization device present and accurately applied. Driveline dressing changed daily per sterile technique. EXTREMITIES:  Warm and dry. No cyanosis, clubbing, rash, or edema.  NEUROLOGIC:  Alert & oriented x 3. Cranial nerves grossly intact. Moves all 4 extremities w/o difficulty. Affect pleasant     Telemetry   Sinus tachy around 100 bpm (personally reviewed).     Labs   Basic Metabolic Panel: Recent Labs  Lab 07/10/17 1107 07/11/17 0958 07/12/17 0240 07/13/17 0312  NA 133* 132* 133* 134*  K 4.5 4.7 4.8 3.9  CL 96* 94* 94* 96*  CO2 22 22 24 23   GLUCOSE 220* 156* 121* 100*  BUN 49* 23* 33* 16  CREATININE 8.84* 5.60* 7.11* 4.97*  CALCIUM 8.6* 8.6* 8.7* 8.3*  PHOS  --  5.3* 5.7* 4.4    Liver Function Tests: Recent Labs  Lab 07/10/17 1107 07/11/17 0958 07/12/17 0240 07/13/17 0312  AST 26  --   --   --   ALT 15*  --   --   --  ALKPHOS 135*  --   --   --   BILITOT 0.5  --   --   --   PROT 7.7  --   --   --   ALBUMIN 2.7* 2.6* 2.6* 2.5*   Recent Labs  Lab 07/11/17 0801  AMYLASE 141*   No results for input(s): AMMONIA in the last 168 hours.  CBC: Recent Labs  Lab 07/06/17 1134 07/10/17 1107 07/11/17 0958 07/12/17 0240 07/13/17 0312  WBC 13.7* 10.1 11.8* 14.2* 9.8  NEUTROABS  --   --   --  12.0* 8.0*  HGB 10.5* 9.1* 9.9* 10.4* 10.7*  HCT 32.6* 28.5* 31.5* 32.2* 33.1*  MCV 81.9 79.8 80.8 78.9 80.5  PLT 373 307 355 401* 337    INR: Recent Labs  Lab 07/06/17 1134 07/10/17 1107 07/11/17 0801 07/12/17 0240 07/13/17 0312  INR 3.83 5.09* 5.47* 6.81* 7.13*    Other results:  EKG:    Imaging   Ct Abdomen Pelvis Wo Contrast  Result Date:  07/11/2017 CLINICAL DATA:  51 year old male with abdominal pain/intractable nausea and vomiting. Ischemic cardiomyopathy with left ventricular assist device in place. End-stage renal disease. Prior appendectomy. Initial encounter. EXAM: CT ABDOMEN AND PELVIS WITHOUT CONTRAST TECHNIQUE: Multidetector CT imaging of the abdomen and pelvis was performed following the standard protocol without IV contrast. COMPARISON:  06/27/2017 chest CT. 04/28/2017 CT of the abdomen and pelvis. FINDINGS: Lower chest: Small to slightly moderate-size left-sided pleural effusion. Basilar atelectasis greater on left. Cardiomegaly. Trace pericardial fluid. Left ventricular assist device and AICD/ pacer in place. Hepatobiliary: Taking into account limitation by non contrast imaging, no worrisome hepatic lesion. Dense appearance of the gallbladder may represent vicarious excretion of contrast. Pancreas: Taking into account limitation by non contrast imaging, no pancreatic mass or inflammation. Spleen: Taking into account limitation by non contrast imaging, no splenic mass or enlargement. Adrenals/Urinary Tract: No obstructing stone or hydronephrosis. Taking into account limitation by non contrast imaging, no renal or adrenal mass. Partially decompressed urinary bladder with circumferential wall thickening without polypoid lesion noted. Stomach/Bowel: Postsurgical changes right colon. Portions of bowel under distended however, no extraluminal bowel inflammatory process detected. Duodenal diverticulum incidentally noted. Vascular/Lymphatic: Vascular calcifications without aortic aneurysm. No adenopathy. Reproductive: Negative. Other: No free intraperitoneal air. Musculoskeletal: Schmorl's node deformity inferior endplate L1. No acute osseous abnormality. IMPRESSION: Postsurgical changes right colon. Portions of bowel are under distended however no extraluminal bowel inflammatory process is noted. Dense gallbladder may represent vicarious  excretion of contrast. If primary gallbladder abnormality were of concern, ultrasound may then be considered. Cardiomegaly with trace pericardial fluid. Left ventricular assist device and pacemaker in place. Small to moderate-size left-sided pleural effusion with basilar atelectasis greater on left. Aortic Atherosclerosis (ICD10-I70.0). Electronically Signed   By: Genia Del M.D.   On: 07/11/2017 11:28   US Abdomen Limited Ruq  Result Date: 07/12/2017 CLINICAL DATA:  Nausea and vomiting. History of hypertension, diabetes, GERD. EXAM: ULTRASOUND ABDOMEN LIMITED RIGHT UPPER QUADRANT COMPARISON:  None. FINDINGS: Gallbladder: No gallstones or wall thickening visualized. No sonographic Murphy sign noted by sonographer. Common bile duct: Diameter: 3 mm Liver: No focal lesion identified. Within normal limits in parenchymal echogenicity. Portal vein is patent on color Doppler imaging with normal direction of blood flow towards the liver. IMPRESSION: Normal right upper quadrant ultrasound. Electronically Signed   By: Franki Cabot M.D.   On: 07/12/2017 11:55     Medications:     Scheduled Medications: . amLODipine  5 mg Oral Daily  .  atorvastatin  40 mg Oral q1800  . darbepoetin (ARANESP) injection - DIALYSIS  100 mcg Intravenous Q Mon-HD  . docusate sodium  200 mg Oral Daily  . erythromycin  400 mg Oral Q6H  . hydrALAZINE  20 mg Intravenous Q4H  . hydrALAZINE  37.5 mg Oral Q8H  . insulin aspart  0-15 Units Subcutaneous TID WC  . insulin aspart  0-5 Units Subcutaneous QHS  . metoCLOPramide (REGLAN) injection  5 mg Intravenous Q12H  . pantoprazole (PROTONIX) IV  40 mg Intravenous Q24H  . promethazine  12.5 mg Intravenous Q8H    Infusions: . amiodarone 30 mg/hr (07/12/17 2015)  . amiodarone 30 mg/hr (07/12/17 1857)  . nitroGLYCERIN 5 mcg/min (07/12/17 2015)    PRN Medications: acetaminophen, hydrALAZINE, LORazepam, ondansetron (ZOFRAN) IV, ondansetron, promethazine   Patient Profile    [name] is a 51 y.o. male with history of CAD s/p anterior MI in 2010 and NSTEMI in 3/18 with DES to pRCA and mLAD and ischemic cardiomyopathy now s/p Heartmate 3 LVAD and ESRD.   Admitted from clinic 07/10/17 with N/V with active vomiting and dry heaves as well as HTN.   Assessment/Plan:    1. Nausea/vomiting: Patient has a prior history of this when volume overloaded though weight was only up 1 lb since 11/1 at admission, and he did not seem to have improved much with fluid off at HD on 11/5.  Per nephrology, he has been near/at his dry weight.  He has a history of diabetic gastroparesis which has led to severe GI symptoms in the past.  He additionally has Crohns disease but this has been quiescent.  No diarrhea, melena, BRBPR.  LFTs are normal.  Abdomen is nontender and he is afebrile.  CT abdomen/pelvis did not show definite abnormality though GB not well-visualized, RUQ [place] was then done and was normal.  EGD 11/7 concerning for gastroparesis.   - Will continue IV Reglan and Zofran for symptomatic treatment.  - He says that Ativan has helped with nausea in the past, will order.    - Trying erythromycin to promote gastric motility.  No IV available in hospital and liquid made him nauseated.  Asked pharmacy to switch to pill form to see if he can tolerate this better.    - IV protonix.  2. HTN: MAP remains elevated, this has been complicated to control given HD.  However, needs good control for LVAD function. He is not taking po meds currently due to nausea/vomiting. MAP better in 90s today on hydralazine IV + NTG gtt.  BP did dip after HD so will likely have to hold meds after HD on MWF.   - He is likely going to need an additional po BP medication when he is again taking po.  Will start amlodipine 5 mg daily (rather than ARB or Entresto as still slight hope for renal recovery) when he can take po.  3. ESRD: Had HD on 11/7. Renal following.  He is at or below dry weight.    4. Chronic  systolic CHF: Ischemic cardiomyopathy. St Jude ICD. NSTEMI in March 2018 with DES to LAD and RCA, complicated by cardiogenic shock, low output requiring milrinone.Echo 8/18 with EF 20-25%, moderate MR. PX (8/18) with severe functional impairment due to HF.He is now s/p HM3 LVAD placement. - Discussed volume status with renal => Dr. Posey Pronto thinks that he is hitting his dry weight without problems.  - LVAD parameters reviewed today and stable despite high BP.    -  MAP remains elevated as above but improving => work on BP control.  - Given heterozygosity for factor V Leiden and history of clotting, he has been on ASA at 325 mg daily and coumadin with INR 2-3 goal.INR 7.1.  Hold warfarin for now and will also hold ASA until INR back down to goal range.    - Continue sildenafil for RV support when he can take po.  5. CAD: NSTEMI in 3/18. LHC with 99% ulcerated lesion proximal RCA with left to right collaterals, 95% mid LAD stenosis after mid LAD stent. s/p PCI to RCA and LAD on 11/25/16.  6. h/o DVTs:Anticoagulated.Dosing per pharm.  7.Diabetic gastroparesis: Suspect this drives current presentation.   8. DM: Sliding scale for now.  9. PAD: Moderate bilateral disease on ABIs in 8/18. Denies claudication currently. No change.  10. Atrial flutter: Currently back in NSR.  Continue amiodarone gtt while not taking pos.    Will try to get him walking more today.   I reviewed the LVAD parameters from today, and compared the results to the patient's prior recorded data.  No programming changes were made.  The LVAD is functioning within specified parameters.  The patient performs LVAD self-test daily.  LVAD interrogation was negative for any significant power changes, alarms or PI events/speed drops.  LVAD equipment check completed and is in good working order.  Back-up equipment present.   LVAD education done on emergency procedures and precautions and reviewed exit site care.  Length of Stay:  3  Loralie Champagne, MD 07/13/2017, 8:17 AM  VAD Team --- VAD ISSUES ONLY--- Pager [phone] (7am - 7am)  Advanced Heart Failure Team  Pager [phone] (M-F; 7a - 4p)  Please contact [facility] for night-coverage after hours (4p -7a ) and weekends on [email or web address]

## 2017-07-13 NOTE — Progress Notes (Signed)
Pt up in chair at beside. Sipping ginger ale, eating jello and fruit cocktail. Feels "much better".   Existing VAD gauze dressing removed and site care performed using sterile technique. Drive line exit site cleaned with Chlora prep applicators x 2, allowed to dry, and Sorbaview dressing with bio patch applied. Will advance to weekly dressing kits. Exit site healed and incorporated, the velour is fully implanted at exit site. No redness, tenderness, drainage, foul odor or rash noted. Drive line anchor re-applied.

## 2017-07-13 NOTE — Progress Notes (Signed)
1150 Came to see pt to walk. Pt fast asleep and would not awaken to name being called. Had received phenergan earlier. Will return if time permits. Graylon Good RN BSN 07/13/2017 2:06 PM

## 2017-07-13 NOTE — Progress Notes (Signed)
Patient ID: [name], male   DOB: 06-14-1966, 51 y.o.   MRN: [id number]  [facility] Progress Note   Assessment/ Plan:   1.  Nausea/vomiting: Underwent EGD yesterday that showed normal esophagus, gastric and duodenal mucosa with suspicion of dysmotility. Started on oral erythromycin in addition to high-dose metoclopramide and when necessary ondansetron/Phenergan. Plans for tapering of antiemetic therapy noted. 2.  ESRD:  underwent hemodialysis yesterday and still remains below his outpatient dry weight-plan for scheduled hemodialysis again tomorrow. 3.  Hypertension/volume:  blood pressures soft overnight, will exercise caution with ultrafiltration at hemodialysis. 4.  Anemia:  status post Aranesp for anemia-continue to monitor hemoglobin/hematocrit trend. No overt losses. 5.  Metabolic bone disease: Ca ok, Phos pending. Monitor for now. 6. Severe ischemic cardiomyopathy (s/p LVAD): Per cardiology, LVAD evaluation later today. 7. Type 2 DM 8. Crohn's Disease  Subjective:   Reports some improvement of nausea overnight-EGD results reviewed.   Objective:   BP 97/70 (BP Location: Left Arm)   Pulse (!) 58   Temp 98.9 F (37.2 C) (Oral)   Resp 18   Ht 5' 8"  (1.727 m)   Wt 77.1 kg (170 lb)   SpO2 97%   BMI 25.85 kg/m   Physical Exam: Gen: Comfortably sleeping in bed CVS: Regular bradycardia, mechanical heart sounds from LVAD Resp: Bilaterally anterior clear to auscultation, no rales/rhonchi  Abd: Soft, flat, nontender Ext: No lower extremity edema  Labs: BMET Recent Labs  Lab 07/10/17 1107 07/11/17 0958 07/12/17 0240 07/13/17 0312  NA 133* 132* 133* 134*  K 4.5 4.7 4.8 3.9  CL 96* 94* 94* 96*  CO2 22 22 24 23   GLUCOSE 220* 156* 121* 100*  BUN 49* 23* 33* 16  CREATININE 8.84* 5.60* 7.11* 4.97*  CALCIUM 8.6* 8.6* 8.7* 8.3*  PHOS  --  5.3* 5.7* 4.4   CBC Recent Labs  Lab 07/10/17 1107 07/11/17 0958 07/12/17 0240 07/13/17 0312  WBC 10.1 11.8*  14.2* 9.8  NEUTROABS  --   --  12.0* 8.0*  HGB 9.1* 9.9* 10.4* 10.7*  HCT 28.5* 31.5* 32.2* 33.1*  MCV 79.8 80.8 78.9 80.5  PLT 307 355 401* 337   Medications:    . amLODipine  5 mg Oral Daily  . atorvastatin  40 mg Oral q1800  . darbepoetin (ARANESP) injection - DIALYSIS  100 mcg Intravenous Q Mon-HD  . docusate sodium  200 mg Oral Daily  . erythromycin  400 mg Oral Q6H  . hydrALAZINE  20 mg Intravenous Q4H  . hydrALAZINE  37.5 mg Oral Q8H  . insulin aspart  0-15 Units Subcutaneous TID WC  . insulin aspart  0-5 Units Subcutaneous QHS  . metoCLOPramide (REGLAN) injection  5 mg Intravenous Q12H  . pantoprazole (PROTONIX) IV  40 mg Intravenous Q24H  . promethazine  12.5 mg Intravenous Q8H   Elmarie Shiley, MD 07/13/2017, 7:36 AM

## 2017-07-13 NOTE — Progress Notes (Signed)
ANTICOAGULATION CONSULT NOTE -Follow Up Consult  Pharmacy Consult for Warfarin Indication: LVAD  Allergies  Allergen Reactions  . Metformin And Related Diarrhea    Patient Measurements: Height: 5' 8"  (172.7 cm) Weight: 170 lb (77.1 kg) IBW/kg (Calculated) : 68.4  Vital Signs: Temp: 98.7 F (37.1 C) (11/08 0707) Temp Source: Axillary (11/08 0707) BP: 136/104 (11/08 0820) Pulse Rate: 58 (11/08 0350)  Labs: Recent Labs    07/11/17 0801 07/11/17 0958 07/12/17 0240 07/13/17 0312  HGB  --  9.9* 10.4* 10.7*  HCT  --  31.5* 32.2* 33.1*  PLT  --  355 401* 337  LABPROT 49.4*  --  58.6* 60.8*  INR 5.47*  --  6.81* 7.13*  CREATININE  --  5.60* 7.11* 4.97*    Estimated Creatinine Clearance: 16.8 mL/min (A) (by C-G formula based on SCr of 4.97 mg/dL (H)).   Medical History: Past Medical History:  Diagnosis Date  . Angina   . ASCVD (arteriosclerotic cardiovascular disease)    , Anterior infarction 2005, LAD diagonal bifurcation intervention 03/2004  . Automatic implantable cardiac defibrillator -[employer]       . Benign neoplasm of colon   . CHF (congestive heart failure) ([place])   . Chronic systolic heart failure ([place])   . Coronary artery disease     Widely patent previously placed stents in the left anterior   . Crohn's disease ([place])   . 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 Dyspnea   . Gastroparesis   . GERD (gastroesophageal reflux disease)   . High cholesterol   . Hyperlipidemia   . Hypersomnolent    Previous diagnosis of narcolepsy  . Hypertension, essential   . Ischemic cardiomyopathy    Ejection fraction 15-20% catheterization 2010  . Type II or unspecified type diabetes mellitus without mention of complication, not stated as uncontrolled   . Unspecified gastritis and gastroduodenitis without mention of hemorrhage      Assessment: LVAD Heartmate 3 and CKD on HD admitted wirh HTN, N/V. INR 5 on admit > 7.13 despite holding warfarin,  LDH stable 200s, no bleeding, CBC stable.     Goal of Therapy:  INR 2-3 Monitor platelets by anticoagulation protocol: Yes   Plan:  Continue to hold Warfarin until INR starts trending down Daily INR  Leroy Libman, PharmD Pharmacy Resident Pager: [phone]

## 2017-07-14 ENCOUNTER — Encounter (HOSPITAL_COMMUNITY): Payer: Self-pay | Admitting: Internal Medicine

## 2017-07-14 ENCOUNTER — Encounter (HOSPITAL_COMMUNITY): Payer: BLUE CROSS/BLUE SHIELD

## 2017-07-14 DIAGNOSIS — R110 Nausea: Secondary | ICD-10-CM

## 2017-07-14 LAB — CBC WITH DIFFERENTIAL/PLATELET
Basophils Absolute: 0 10*3/uL (ref 0.0–0.1)
Basophils Relative: 0 %
EOS ABS: 0.1 10*3/uL (ref 0.0–0.7)
Eosinophils Relative: 1 %
HEMATOCRIT: 31.5 % — AB (ref 39.0–52.0)
HEMOGLOBIN: 10 g/dL — AB (ref 13.0–17.0)
LYMPHS ABS: 1.3 10*3/uL (ref 0.7–4.0)
LYMPHS PCT: 12 %
MCH: 25.3 pg — AB (ref 26.0–34.0)
MCHC: 31.7 g/dL (ref 30.0–36.0)
MCV: 79.5 fL (ref 78.0–100.0)
MONOS PCT: 9 %
Monocytes Absolute: 1.1 10*3/uL — ABNORMAL HIGH (ref 0.1–1.0)
NEUTROS ABS: 9 10*3/uL — AB (ref 1.7–7.7)
NEUTROS PCT: 78 %
Platelets: 383 10*3/uL (ref 150–400)
RBC: 3.96 MIL/uL — AB (ref 4.22–5.81)
RDW: 17 % — ABNORMAL HIGH (ref 11.5–15.5)
WBC: 11.5 10*3/uL — AB (ref 4.0–10.5)

## 2017-07-14 LAB — BASIC METABOLIC PANEL
Anion gap: 14 (ref 5–15)
BUN: 34 mg/dL — ABNORMAL HIGH (ref 6–20)
CO2: 25 mmol/L (ref 22–32)
CREATININE: 7.16 mg/dL — AB (ref 0.61–1.24)
Calcium: 8.3 mg/dL — ABNORMAL LOW (ref 8.9–10.3)
Chloride: 94 mmol/L — ABNORMAL LOW (ref 101–111)
GFR calc Af Amer: 9 mL/min — ABNORMAL LOW (ref >60)
GFR, EST NON AFRICAN AMERICAN: 8 mL/min — AB (ref >60)
GLUCOSE: 130 mg/dL — AB (ref 65–99)
POTASSIUM: 3.8 mmol/L (ref 3.5–5.1)
Sodium: 133 mmol/L — ABNORMAL LOW (ref 135–145)

## 2017-07-14 LAB — GLUCOSE, CAPILLARY
GLUCOSE-CAPILLARY: 160 mg/dL — AB (ref 65–99)
Glucose-Capillary: 124 mg/dL — ABNORMAL HIGH (ref 65–99)
Glucose-Capillary: 102 mg/dL — ABNORMAL HIGH (ref 65–99)
Glucose-Capillary: 146 mg/dL — ABNORMAL HIGH (ref 65–99)

## 2017-07-14 LAB — HEPATIC FUNCTION PANEL
ALT: 15 U/L — AB (ref 17–63)
AST: 22 U/L (ref 15–41)
Albumin: 2.5 g/dL — ABNORMAL LOW (ref 3.5–5.0)
Alkaline Phosphatase: 126 U/L (ref 38–126)
BILIRUBIN INDIRECT: 0.5 mg/dL (ref 0.3–0.9)
BILIRUBIN TOTAL: 0.7 mg/dL (ref 0.3–1.2)
Bilirubin, Direct: 0.2 mg/dL (ref 0.1–0.5)
TOTAL PROTEIN: 6.9 g/dL (ref 6.5–8.1)

## 2017-07-14 LAB — LACTATE DEHYDROGENASE: LDH: 247 U/L — ABNORMAL HIGH (ref 98–192)

## 2017-07-14 LAB — PROTIME-INR
INR: 7.11
Prothrombin Time: 60.7 seconds — ABNORMAL HIGH (ref 11.4–15.2)

## 2017-07-14 MED ORDER — AMLODIPINE BESYLATE 5 MG PO TABS
5.0000 mg | ORAL_TABLET | ORAL | Status: DC
  Administered 2017-07-15: 5 mg via ORAL
  Filled 2017-07-14: qty 1

## 2017-07-14 MED ORDER — LORAZEPAM 2 MG/ML IJ SOLN
INTRAMUSCULAR | Status: AC
  Administered 2017-07-14: 0.5 mg via INTRAVENOUS
  Filled 2017-07-14: qty 1

## 2017-07-14 MED ORDER — AMIODARONE HCL 200 MG PO TABS
200.0000 mg | ORAL_TABLET | Freq: Every day | ORAL | Status: DC
Start: 2017-07-14 — End: 2017-07-16
  Administered 2017-07-14 – 2017-07-16 (×3): 200 mg via ORAL
  Filled 2017-07-14 (×3): qty 1

## 2017-07-14 MED ORDER — PANTOPRAZOLE SODIUM 40 MG PO TBEC
40.0000 mg | DELAYED_RELEASE_TABLET | Freq: Every day | ORAL | Status: DC
  Administered 2017-07-14 – 2017-07-16 (×3): 40 mg via ORAL
  Filled 2017-07-14 (×3): qty 1

## 2017-07-14 MED ORDER — PROMETHAZINE HCL 25 MG/ML IJ SOLN
INTRAMUSCULAR | Status: AC
  Administered 2017-07-14: 12.5 mg via INTRAVENOUS
  Filled 2017-07-14: qty 1

## 2017-07-14 MED ORDER — SILDENAFIL CITRATE 20 MG PO TABS
20.0000 mg | ORAL_TABLET | Freq: Three times a day (TID) | ORAL | Status: DC
  Administered 2017-07-14 – 2017-07-16 (×7): 20 mg via ORAL
  Filled 2017-07-14 (×7): qty 1

## 2017-07-14 MED ORDER — METOCLOPRAMIDE HCL 10 MG PO TABS
5.0000 mg | ORAL_TABLET | Freq: Three times a day (TID) | ORAL | Status: DC
  Administered 2017-07-14 – 2017-07-16 (×5): 5 mg via ORAL
  Filled 2017-07-14 (×4): qty 1

## 2017-07-14 NOTE — Progress Notes (Signed)
ANTICOAGULATION CONSULT NOTE -Follow Up Consult  Pharmacy Consult for Warfarin Indication: LVAD  Allergies  Allergen Reactions  . Metformin And Related Diarrhea    Patient Measurements: Height: 5' 8"  (172.7 cm) Weight: 171 lb 8.3 oz (77.8 kg) IBW/kg (Calculated) : 68.4  Vital Signs: Temp: 98.5 F (36.9 C) (11/09 0824) Temp Source: Oral (11/09 0824) BP: 120/93 (11/09 0831) Pulse Rate: 88 (11/09 0824)  Labs: Recent Labs    07/12/17 0240 07/13/17 0312 07/14/17 0327  HGB 10.4* 10.7* 10.0*  HCT 32.2* 33.1* 31.5*  PLT 401* 337 383  LABPROT 58.6* 60.8* 60.7*  INR 6.81* 7.13* 7.11*  CREATININE 7.11* 4.97* 7.16*    Estimated Creatinine Clearance: 11.7 mL/min (A) (by C-G formula based on SCr of 7.16 mg/dL (H)).   Medical History: Past Medical History:  Diagnosis Date  . Angina   . ASCVD (arteriosclerotic cardiovascular disease)    , Anterior infarction 2005, LAD diagonal bifurcation intervention 03/2004  . Automatic implantable cardiac defibrillator -[employer]       . Benign neoplasm of colon   . CHF (congestive heart failure) ([place])   . Chronic systolic heart failure ([place])   . Coronary artery disease     Widely patent previously placed stents in the left anterior   . Crohn's disease ([place])   . 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 Dyspnea   . Gastroparesis   . GERD (gastroesophageal reflux disease)   . High cholesterol   . Hyperlipidemia   . Hypersomnolent    Previous diagnosis of narcolepsy  . Hypertension, essential   . Ischemic cardiomyopathy    Ejection fraction 15-20% catheterization 2010  . Type II or unspecified type diabetes mellitus without mention of complication, not stated as uncontrolled   . Unspecified gastritis and gastroduodenitis without mention of hemorrhage     Assessment: LVAD Heartmate 3 and CKD on HD admitted wirh HTN, N/V. INR 5 on admit > 7.11 despite holding warfarin. LFTs are wnl, LDH stable 200s, no bleeding,  CBC stable.     Goal of Therapy:  INR 2-3 Monitor platelets by anticoagulation protocol: Yes   Plan:  Continue to hold Warfarin until INR is within goal range Daily INR  Leroy Libman, PharmD Pharmacy Resident Pager: [phone]

## 2017-07-14 NOTE — Procedures (Signed)
Patient seen on Hemodialysis. QB 400, UF goal 1.5L (net) Treatment adjusted as needed.  Elmarie Shiley MD [facility]. Office # [phone] Pager # [phone] 10:25 AM

## 2017-07-14 NOTE — Progress Notes (Signed)
Patient ID: [name], male   DOB: Feb 17, 1966, 51 y.o.   MRN: [id number]  [facility] Progress Note   Assessment/ Plan:   1.  Nausea/vomiting: Underwent EGD yesterday that showed normal esophagus, gastric and duodenal mucosa with suspicion of dysmotility. Already showing significant improvement on a combination of oral erythromycin in addition to intravenous metoclopramide and Phenergan. Suspect that he will likely transition to oral antiemetics today and review response prior to plans for discharge. 2.  ESRD:  usually on a Monday/Wednesday/Friday schedule, planned for hemodialysis today. He appears to be euvolemic on physical exam and does not have any acute electrolyte abnormalities. Discussed restricting fluid intake with his hyponatremia. 3.  Hypertension/volume:  blood pressures soft overnight, will exercise caution with ultrafiltration at hemodialysis. 4.  Anemia:  status post Aranesp for anemia-continue to monitor hemoglobin/hematocrit trend. No overt losses. 5.  Metabolic bone disease:  calcium/phosphorus levels currently at goal. Monitor for now. 6. Severe ischemic cardiomyopathy (s/p LVAD): Management per cardiology-LVAD coordinator rounding notes reviewed. 7. Type 2 DM 8. Crohn's Disease  Subjective:   States that he feels much better and has been able to retain all his meals since yesterday afternoon.    Objective:   BP (!) 89/74 (BP Location: Left Arm)   Pulse 91   Temp 98.2 F (36.8 C) (Oral)   Resp (!) 22   Ht 5' 8"  (1.727 m)   Wt 77.8 kg (171 lb 8.3 oz)   SpO2 98%   BMI 26.08 kg/m   Physical Exam: Gen: Comfortably resting in bed CVS: Regular bradycardia, mechanical heart sounds from LVAD Resp: Bilaterally anterior clear to auscultation, no rales/rhonchi  Abd: Soft, flat, nontender Ext: No lower extremity edema  Labs: BMET Recent Labs  Lab 07/10/17 1107 07/11/17 0958 07/12/17 0240 07/13/17 0312 07/14/17 0327  NA 133* 132* 133* 134*  133*  K 4.5 4.7 4.8 3.9 3.8  CL 96* 94* 94* 96* 94*  CO2 22 22 24 23 25   GLUCOSE 220* 156* 121* 100* 130*  BUN 49* 23* 33* 16 34*  CREATININE 8.84* 5.60* 7.11* 4.97* 7.16*  CALCIUM 8.6* 8.6* 8.7* 8.3* 8.3*  PHOS  --  5.3* 5.7* 4.4  --    CBC Recent Labs  Lab 07/11/17 0958 07/12/17 0240 07/13/17 0312 07/14/17 0327  WBC 11.8* 14.2* 9.8 11.5*  NEUTROABS  --  12.0* 8.0* 9.0*  HGB 9.9* 10.4* 10.7* 10.0*  HCT 31.5* 32.2* 33.1* 31.5*  MCV 80.8 78.9 80.5 79.5  PLT 355 401* 337 383   Medications:    . amiodarone  200 mg Oral Daily  . amLODipine  5 mg Oral Daily  . atorvastatin  40 mg Oral q1800  . darbepoetin (ARANESP) injection - DIALYSIS  100 mcg Intravenous Q Mon-HD  . docusate sodium  200 mg Oral Daily  . erythromycin  250 mg Oral TID  . hydrALAZINE  37.5 mg Oral Q8H  . insulin aspart  0-15 Units Subcutaneous TID WC  . insulin aspart  0-5 Units Subcutaneous QHS  . metoCLOPramide (REGLAN) injection  5 mg Intravenous Q12H  . pantoprazole (PROTONIX) IV  40 mg Intravenous Q24H  . promethazine  12.5 mg Intravenous Q8H  . sildenafil  20 mg Oral TID   Elmarie Shiley, MD 07/14/2017, 7:48 AM

## 2017-07-14 NOTE — Progress Notes (Signed)
PT Cancellation Note  Patient Details Name: LENNART GLADISH MRN: [id number] DOB: October 05, 1965   Cancelled Treatment:    Reason Eval/Treat Not Completed: Patient at procedure or test/unavailable. Pt in HD.   [place] 07/14/2017, 1:47 PM [place]

## 2017-07-14 NOTE — Progress Notes (Signed)
Patient arrived to unit by bed.  Reviewed treatment plan and this RN agrees with plan.  Report received from bedside RN, Pamala Hurry.  Consent verified.  Patient A & o X 4.   Lung sounds diminished to ausculation in all fields. No edema. Cardiac:  NSR.  Removed caps and cleansed RIJ catheter with chlorhedxidine.  Aspirated ports of heparin and flushed them with saline per protocol.  Connected and secured lines, initiated treatment at 1016.  UF Goal of 2000 mL and net fluid removal 1.5 L.  Will continue to monitor.

## 2017-07-14 NOTE — Progress Notes (Signed)
Patient ID: [name], male   DOB: [birth year], 50 y.o.   MRN: [id number]    Advanced Heart Failure VAD Team Note  Subjective:    [name] is a 51 y.o. male with history of CAD s/p anterior MI in 2010 and NSTEMI in 3/18 with DES to pRCA and mLAD and ischemic cardiomyopathy now s/p Heartmate 3 LVAD and ESRD   He was admitted on 11/5 with about 24 hrs of intractable nausea/vomiting.  He had HD 11/5 in hospital, weight down.  Discussed with Dr. Posey Pronto and do not suspect volume overload (he has been getting down to dry weight with HD).   CT abdomen/pelvis did not show definite pathology though gallbladder not well-visualized.  RUQ [place] normal.   He went into atrial flutter with RVR 11/6 in the morning while vomiting.  He is now in NSR on amiodarone.   MAP lower 70s-90s now.  Remains on NTG gtt at 16.   He started po erythromycin, now feeling much better.  Able to eat last night and planning to try breakfast.  No nausea currently.  INR remains high but has plateaued.   EGD (11/7): Retained fluid, concern for gastroparesis.  Erythromycin started but unable to tolerate more than 1 dose of liquid due to nausea.   LVAD INTERROGATION:  HeartMate II LVAD:  Flow 4.1 liters/min, speed 5600, power 4.2, PI 3.4.    Objective:    Vital Signs:   Temp:  [97.5 F (36.4 C)-98.9 F (37.2 C)] 98.2 F (36.8 C) (11/09 0425) Pulse Rate:  [61-91] 91 (11/09 0425) Resp:  [17-22] 22 (11/09 0425) BP: (82-136)/(39-104) 89/74 (11/09 0425) SpO2:  [97 %-98 %] 98 % (11/09 0425) Weight:  [171 lb 8.3 oz (77.8 kg)] 171 lb 8.3 oz (77.8 kg) (11/09 0434) Last BM Date: 07/12/17 Mean arterial Pressure  70s-90s  Intake/Output:   Intake/Output Summary (Last 24 hours) at 07/14/2017 0808 Last data filed at 07/14/2017 0435 Gross per 24 hour  Intake 424.96 ml  Output -  Net 424.96 ml     Physical Exam    GENERAL: Well appearing this am. NAD.  HEENT: Normal. NECK: Supple, JVP 8-9 cm. Carotids OK.    CARDIAC:  Mechanical heart sounds with LVAD hum present.  LUNGS:  CTAB, normal effort.  ABDOMEN:  NT, ND, no HSM. No bruits or masses. +BS  LVAD exit site: Well-healed and incorporated. Dressing dry and intact. No erythema or drainage. Stabilization device present and accurately applied. Driveline dressing changed daily per sterile technique. EXTREMITIES:  Warm and dry. No cyanosis, clubbing, rash, or edema.  NEUROLOGIC:  Alert & oriented x 3. Cranial nerves grossly intact. Moves all 4 extremities w/o difficulty. Affect pleasant      Telemetry   NSR 80s (personally reviewed).     Labs   Basic Metabolic Panel: Recent Labs  Lab 07/10/17 1107 07/11/17 0958 07/12/17 0240 07/13/17 0312 07/14/17 0327  NA 133* 132* 133* 134* 133*  K 4.5 4.7 4.8 3.9 3.8  CL 96* 94* 94* 96* 94*  CO2 22 22 24 23 25   GLUCOSE 220* 156* 121* 100* 130*  BUN 49* 23* 33* 16 34*  CREATININE 8.84* 5.60* 7.11* 4.97* 7.16*  CALCIUM 8.6* 8.6* 8.7* 8.3* 8.3*  PHOS  --  5.3* 5.7* 4.4  --     Liver Function Tests: Recent Labs  Lab 07/10/17 1107 07/11/17 0958 07/12/17 0240 07/13/17 0312 07/14/17 0327  AST 26  --   --   --  22  ALT 15*  --   --   --  15*  ALKPHOS 135*  --   --   --  126  BILITOT 0.5  --   --   --  0.7  PROT 7.7  --   --   --  6.9  ALBUMIN 2.7* 2.6* 2.6* 2.5* 2.5*   Recent Labs  Lab 07/11/17 0801  AMYLASE 141*   No results for input(s): AMMONIA in the last 168 hours.  CBC: Recent Labs  Lab 07/10/17 1107 07/11/17 0958 07/12/17 0240 07/13/17 0312 07/14/17 0327  WBC 10.1 11.8* 14.2* 9.8 11.5*  NEUTROABS  --   --  12.0* 8.0* 9.0*  HGB 9.1* 9.9* 10.4* 10.7* 10.0*  HCT 28.5* 31.5* 32.2* 33.1* 31.5*  MCV 79.8 80.8 78.9 80.5 79.5  PLT 307 355 401* 337 383    INR: Recent Labs  Lab 07/10/17 1107 07/11/17 0801 07/12/17 0240 07/13/17 0312 07/14/17 0327  INR 5.09* 5.47* 6.81* 7.13* 7.11*    Other results:  EKG:    Imaging   Dg Chest 2 View  Result Date:  07/13/2017 CLINICAL DATA:  Weakness.  Recent LVAD placed. EXAM: CHEST  2 VIEW COMPARISON:  07/03/2017. FINDINGS: Poor inspiration. Stable enlarged cardiac silhouette. Mildly decreased opacity at the left lung base. Stable median sternotomy wires, LVAD, left subclavian pacer and AICD leads and right jugular double-lumen catheter with its tip in the inferior right atrium. Unremarkable bones. IMPRESSION: 1. Improving left basilar atelectasis. 2. Stable cardiomegaly. 3. Right jugular double-lumen catheter tip in the inferior right atrium. Electronically Signed   By: Claudie Revering M.D.   On: 07/13/2017 11:23   US Abdomen Limited Ruq  Result Date: 07/12/2017 CLINICAL DATA:  Nausea and vomiting. History of hypertension, diabetes, GERD. EXAM: ULTRASOUND ABDOMEN LIMITED RIGHT UPPER QUADRANT COMPARISON:  None. FINDINGS: Gallbladder: No gallstones or wall thickening visualized. No sonographic Murphy sign noted by sonographer. Common bile duct: Diameter: 3 mm Liver: No focal lesion identified. Within normal limits in parenchymal echogenicity. Portal vein is patent on color Doppler imaging with normal direction of blood flow towards the liver. IMPRESSION: Normal right upper quadrant ultrasound. Electronically Signed   By: Franki Cabot M.D.   On: 07/12/2017 11:55     Medications:     Scheduled Medications: . amiodarone  200 mg Oral Daily  . amLODipine  5 mg Oral Daily  . atorvastatin  40 mg Oral q1800  . darbepoetin (ARANESP) injection - DIALYSIS  100 mcg Intravenous Q Mon-HD  . docusate sodium  200 mg Oral Daily  . erythromycin  250 mg Oral TID  . hydrALAZINE  37.5 mg Oral Q8H  . insulin aspart  0-15 Units Subcutaneous TID WC  . insulin aspart  0-5 Units Subcutaneous QHS  . metoCLOPramide (REGLAN) injection  5 mg Intravenous Q12H  . pantoprazole (PROTONIX) IV  40 mg Intravenous Q24H  . promethazine  12.5 mg Intravenous Q8H  . sildenafil  20 mg Oral TID    Infusions:   PRN  Medications: acetaminophen, hydrALAZINE, LORazepam, ondansetron (ZOFRAN) IV, ondansetron, promethazine   Patient Profile   [name] is a 51 y.o. male with history of CAD s/p anterior MI in 2010 and NSTEMI in 3/18 with DES to pRCA and mLAD and ischemic cardiomyopathy now s/p Heartmate 3 LVAD and ESRD.   Admitted from clinic 07/10/17 with N/V with active vomiting and dry heaves as well as HTN.   Assessment/Plan:    1. Nausea/vomiting: Patient has a prior history of  this when volume overloaded though weight was only up 1 lb since 11/1 at admission, and he did not seem to have improved much with fluid off at HD on 11/5.  Per nephrology, he has been near/at his dry weight.  He has a history of diabetic gastroparesis which has led to severe GI symptoms in the past.  He additionally has Crohns disease but this has been quiescent.  No diarrhea, melena, BRBPR.  LFTs are normal.  Abdomen is nontender and he is afebrile.  CT abdomen/pelvis did not show definite abnormality though GB not well-visualized, RUQ [place] was then done and was normal.  EGD 11/7 concerning for gastroparesis.  With treatment with erythromycin, he seems to be doing better and is now taking po.  - Continue erythromycin per GI, think this would be preferable to domperidone given arrhythmia risk.  - Change Reglan to 5 mg po tid.  - Protonix to po.  2. HTN: MAP improved today.   - Stop NTG gtt.  - Will plan to give him amlodipine 5 mg + hydralazine 37.5 mg tid on non-HD days.  Will need to see how he does in terms of MAP on HD days to determine medication need on HD days.    3. ESRD: HD today.  4. Chronic systolic CHF: Ischemic cardiomyopathy. St Jude ICD. NSTEMI in March 2018 with DES to LAD and RCA, complicated by cardiogenic shock, low output requiring milrinone.Echo 8/18 with EF 20-25%, moderate MR. PX (8/18) with severe functional impairment due to HF.He is now s/p HM3 LVAD placement. - Discussed volume status with  renal => Dr. Posey Pronto thinks that he is hitting his dry weight without problems.  - LVAD parameters reviewed today and stable.    - MAP better.  - Given heterozygosity for factor V Leiden and history of clotting, he has been on ASA at 325 mg daily and coumadin with INR 2-3 goal.INR 7.1 => has plateaued, should start to come down now that he is eating again.  Hold warfarin for now and will also hold ASA until INR back down to goal range.    - Restart sildenafil now that he is taking po.  5. CAD: NSTEMI in 3/18. LHC with 99% ulcerated lesion proximal RCA with left to right collaterals, 95% mid LAD stenosis after mid LAD stent. s/p PCI to RCA and LAD on 11/25/16.  6. h/o DVTs:Anticoagulated.Dosing per pharm.  7.Diabetic gastroparesis: Suspect this drives current presentation.   8. DM: Sliding scale for now while minimal po intake.  9. PAD: Moderate bilateral disease on ABIs in 8/18. Denies claudication currently. No change.  10. Atrial flutter: Currently back in NSR.  Can convert back to po amiodarone.    Continue ambulation.  Monitor for now in hospital to make sure he tolerates po and INR comes down.  Possibly home over weekend versus Monday.    I reviewed the LVAD parameters from today, and compared the results to the patient's prior recorded data.  No programming changes were made.  The LVAD is functioning within specified parameters.  The patient performs LVAD self-test daily.  LVAD interrogation was negative for any significant power changes, alarms or PI events/speed drops.  LVAD equipment check completed and is in good working order.  Back-up equipment present.   LVAD education done on emergency procedures and precautions and reviewed exit site care.  Length of Stay: 4  Loralie Champagne, MD 07/14/2017, 8:08 AM  VAD Team --- VAD ISSUES ONLY--- Pager [phone] (7am - 7am)  Advanced Heart Failure Team  Pager [phone] (M-F; [place])  Please contact [facility] for night-coverage after  hours (4p -7a ) and weekends on [email or web address]

## 2017-07-14 NOTE — Progress Notes (Signed)
Patient to transition to PO Amiodarone and to discontinue nitroglycerin gtt and amiodarone gtt.  RN directed by pharmacist to continue the gtt's for one hour past administration of PO Amiodarone.  Both drips discontinued one hour after administration, Patient tolerated transition well.  Will continue to monitor.

## 2017-07-14 NOTE — Progress Notes (Signed)
[facility] Progress Note  Chief Complaint:    Nausea and vomiting  Subjective: He feels better. Nausea still comes in waves but not vomiting. He held down nutritional drink this am.   Objective:  Vital signs in last 24 hours: Temp:  [97.5 F (36.4 C)-98.9 F (37.2 C)] 98.5 F (36.9 C) (11/09 0824) Pulse Rate:  [61-91] 88 (11/09 0824) Resp:  [17-22] 18 (11/09 0824) BP: (82-140)/(39-93) 120/93 (11/09 0831) SpO2:  [97 %-98 %] 98 % (11/09 0824) Weight:  [171 lb 8.3 oz (77.8 kg)] 171 lb 8.3 oz (77.8 kg) (11/09 0434) Last BM Date: 07/12/17 General:   Alert, well-developed, black male  in NAD EENT:  Normal hearing, non icteric sclera, conjunctive pink.  Heart:  Regular rate and rhythm; no murmurs. No lower extremity edema Pulm: Normal respiratory effort, lungs CTA bilaterally without wheezes or crackles. Abdomen:  Soft, nondistended, nontender.  Normal bowel sounds, no masses felt. No hepatomegaly.    Neurologic:  Alert and  oriented x4;  grossly normal neurologically. Psych:  Pleasant, cooperative.  Normal mood and affect.   Intake/Output from previous day: 11/08 0701 - 11/09 0700 In: 425 [P.O.:240; I.V.:185] Out: -  Intake/Output this shift: No intake/output data recorded.  Lab Results: Recent Labs    07/12/17 0240 07/13/17 0312 07/14/17 0327  WBC 14.2* 9.8 11.5*  HGB 10.4* 10.7* 10.0*  HCT 32.2* 33.1* 31.5*  PLT 401* 337 383   BMET Recent Labs    07/12/17 0240 07/13/17 0312 07/14/17 0327  NA 133* 134* 133*  K 4.8 3.9 3.8  CL 94* 96* 94*  CO2 24 23 25   GLUCOSE 121* 100* 130*  BUN 33* 16 34*  CREATININE 7.11* 4.97* 7.16*  CALCIUM 8.7* 8.3* 8.3*   LFT Recent Labs    07/14/17 0327  PROT 6.9  ALBUMIN 2.5*  AST 22  ALT 15*  ALKPHOS 126  BILITOT 0.7  BILIDIR 0.2  IBILI 0.5   PT/INR Recent Labs    07/13/17 0312 07/14/17 0327  LABPROT 60.8* 60.7*  INR 7.13* 7.11*   Hepatitis Panel No results for input(s): HEPBSAG, HCVAB,  HEPAIGM, HEPBIGM in the last 72 hours.  Dg Chest 2 View  Result Date: 07/13/2017 CLINICAL DATA:  Weakness.  Recent LVAD placed. EXAM: CHEST  2 VIEW COMPARISON:  07/03/2017. FINDINGS: Poor inspiration. Stable enlarged cardiac silhouette. Mildly decreased opacity at the left lung base. Stable median sternotomy wires, LVAD, left subclavian pacer and AICD leads and right jugular double-lumen catheter with its tip in the inferior right atrium. Unremarkable bones. IMPRESSION: 1. Improving left basilar atelectasis. 2. Stable cardiomegaly. 3. Right jugular double-lumen catheter tip in the inferior right atrium. Electronically Signed   By: Claudie Revering M.D.   On: 07/13/2017 11:23   US Abdomen Limited Ruq  Result Date: 07/12/2017 CLINICAL DATA:  Nausea and vomiting. History of hypertension, diabetes, GERD. EXAM: ULTRASOUND ABDOMEN LIMITED RIGHT UPPER QUADRANT COMPARISON:  None. FINDINGS: Gallbladder: No gallstones or wall thickening visualized. No sonographic Murphy sign noted by sonographer. Common bile duct: Diameter: 3 mm Liver: No focal lesion identified. Within normal limits in parenchymal echogenicity. Portal vein is patent on color Doppler imaging with normal direction of blood flow towards the liver. IMPRESSION: Normal right upper quadrant ultrasound. Electronically Signed   By: Franki Cabot M.D.   On: 07/12/2017 11:55    ASSESSMENT / PLAN:   51 yo male with multiple medical problems including gastroparesis. EGD >>> retained fluid c/w gastroparesis. On scheduled Reglan and  started on Erythromycin two days ago. He is not vomiting, just waves of nausea but held down nutritional drink this am and looking forward to lunch -continue scheduled renal dose of Reglan.  -continue PO Erythromycin for 5 additional days.  -continue Zofran for breakthrough n/v -he already eats small frequent meals at home.   GI signing off. Call with questions . 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

## 2017-07-14 NOTE — Progress Notes (Signed)
Dialysis treatment completed.  1500 mL ultrafiltrated.  1000 mL net fluid removal.  Patient status unchanged. Lung sounds diminished to ausculation in all fields. No edema. Cardiac: NSR.  Cleansed RIJ catheter with chlorhexidine.  Disconnected lines and flushed ports with saline per protocol.  Ports locked with heparin and capped per protocol.    Report given to bedside, RN Pamala Hurry.

## 2017-07-14 NOTE — Progress Notes (Signed)
1355 Have tried to see pt twice for ambulation, pt in HD. Will continue to follow. Graylon Good RN BSN 07/14/2017 1:55 PM

## 2017-07-14 NOTE — Progress Notes (Signed)
LVAD Coordinator Rounding Note:  Admitted 07/11/17 for intractable nausea/vomiting and hypertension.   HM3LVAD implanted on 10/8/2018by Dr Elpidio [name] Destination Therapy criteria due to renal insufficiency.  Vital signs: Temp: 98.5 HR:  88 Doppler: 85 Auto cuff: 120/93 (101) O2 Sat: 98% on RA  Wt:179>171>172>170>171 lbs   LVAD interrogation reveals:  Speed: 5600 Flow:  4.1 Power: 4.2w PI: 4.0 Alarms: none Events: none Hematocrit: 32 Fixed speed: 5600 Low speed limit: 5300   Drive Line: Daily. Advanced to weekly dressings. Due 07/20/17.  Labs:  LDH trend: 255>383>246>268>247  INR trend: 5.09>5.47>6.8>7.13>7.11  Hgb trend: 9.1>9.9>10.4>10  Anticoagulation Plan: -INR Goal: 2-3 -ASA Dose: 359m daily - placed on hold 07/12/17  Gtts: Amio @ 30 mg/hr (started 07/11/17 for Aflutter with RVR) stopped 07/14/17 Ntg: 5 mcg/min stopped 07/14/17  Device: -St Jude dual ICD -Therapies: ON at 200 bpm   Renal:  Creat trend: 8.84>5.60>7.11>4.97>7.16 HD - MWF  Significant Events on Support: CT of abd for increase N/V 07/11/17  Plan/Recommendations:  1. Possibly d/c over the weekend.  4.  Call VAD pager for any VAD equipment issues.   STanda RockersRN, VAD Coordinator 24/7 pager [phone]

## 2017-07-15 DIAGNOSIS — G43A1 Cyclical vomiting, intractable: Secondary | ICD-10-CM

## 2017-07-15 LAB — RENAL FUNCTION PANEL
ALBUMIN: 2.4 g/dL — AB (ref 3.5–5.0)
ANION GAP: 11 (ref 5–15)
BUN: 17 mg/dL (ref 6–20)
CALCIUM: 8 mg/dL — AB (ref 8.9–10.3)
CO2: 26 mmol/L (ref 22–32)
CREATININE: 4.62 mg/dL — AB (ref 0.61–1.24)
Chloride: 94 mmol/L — ABNORMAL LOW (ref 101–111)
GFR calc Af Amer: 15 mL/min — ABNORMAL LOW (ref >60)
GFR calc non Af Amer: 13 mL/min — ABNORMAL LOW (ref >60)
Glucose, Bld: 153 mg/dL — ABNORMAL HIGH (ref 65–99)
PHOSPHORUS: 2.9 mg/dL (ref 2.5–4.6)
Potassium: 3.6 mmol/L (ref 3.5–5.1)
SODIUM: 131 mmol/L — AB (ref 135–145)

## 2017-07-15 LAB — GLUCOSE, CAPILLARY
GLUCOSE-CAPILLARY: 172 mg/dL — AB (ref 65–99)
GLUCOSE-CAPILLARY: 188 mg/dL — AB (ref 65–99)
Glucose-Capillary: 250 mg/dL — ABNORMAL HIGH (ref 65–99)
Glucose-Capillary: 117 mg/dL — ABNORMAL HIGH (ref 65–99)

## 2017-07-15 LAB — CBC WITH DIFFERENTIAL/PLATELET
BASOS ABS: 0 10*3/uL (ref 0.0–0.1)
BASOS PCT: 0 %
EOS ABS: 0.1 10*3/uL (ref 0.0–0.7)
EOS PCT: 1 %
HCT: 31.9 % — ABNORMAL LOW (ref 39.0–52.0)
Hemoglobin: 10.2 g/dL — ABNORMAL LOW (ref 13.0–17.0)
Lymphocytes Relative: 12 %
Lymphs Abs: 1.2 10*3/uL (ref 0.7–4.0)
MCH: 25.6 pg — AB (ref 26.0–34.0)
MCHC: 32 g/dL (ref 30.0–36.0)
MCV: 80.2 fL (ref 78.0–100.0)
MONO ABS: 1.1 10*3/uL — AB (ref 0.1–1.0)
Monocytes Relative: 11 %
NEUTROS ABS: 7.3 10*3/uL (ref 1.7–7.7)
Neutrophils Relative %: 76 %
PLATELETS: 351 10*3/uL (ref 150–400)
RBC: 3.98 MIL/uL — AB (ref 4.22–5.81)
RDW: 17.6 % — ABNORMAL HIGH (ref 11.5–15.5)
WBC: 9.7 10*3/uL (ref 4.0–10.5)

## 2017-07-15 LAB — PROTIME-INR
INR: 3.72
Prothrombin Time: 36.6 seconds — ABNORMAL HIGH (ref 11.4–15.2)

## 2017-07-15 LAB — LACTATE DEHYDROGENASE: LDH: 260 U/L — AB (ref 98–192)

## 2017-07-15 MED ORDER — AMLODIPINE BESYLATE 2.5 MG PO TABS
2.5000 mg | ORAL_TABLET | ORAL | Status: DC
Start: 2017-07-16 — End: 2017-07-16
  Administered 2017-07-16: 2.5 mg via ORAL
  Filled 2017-07-15: qty 1

## 2017-07-15 NOTE — Progress Notes (Signed)
ANTICOAGULATION CONSULT NOTE -Follow Up Consult  Pharmacy Consult for Warfarin Indication: LVAD  Allergies  Allergen Reactions  . Metformin And Related Diarrhea    Patient Measurements: Height: 5' 8"  (172.7 cm) Weight: 170 lb 10.2 oz (77.4 kg) IBW/kg (Calculated) : 68.4  Vital Signs: Temp: 98.4 F (36.9 C) (11/10 0826) Temp Source: Oral (11/10 0826) BP: 97/72 (11/10 0826) Pulse Rate: 89 (11/10 0826)  Labs: Recent Labs    07/13/17 0312 07/14/17 0327 07/15/17 0355  HGB 10.7* 10.0* 10.2*  HCT 33.1* 31.5* 31.9*  PLT 337 383 351  LABPROT 60.8* 60.7* 36.6*  INR 7.13* 7.11* 3.72  CREATININE 4.97* 7.16* 4.62*    Estimated Creatinine Clearance: 18.1 mL/min (A) (by C-G formula based on SCr of 4.62 mg/dL (H)).   Medical History: Past Medical History:  Diagnosis Date  . Angina   . ASCVD (arteriosclerotic cardiovascular disease)    , Anterior infarction 2005, LAD diagonal bifurcation intervention 03/2004  . Automatic implantable cardiac defibrillator -St. Jude's       . Benign neoplasm of colon   . CHF (congestive heart failure) ([place])   . Chronic systolic heart failure ([place])   . Coronary artery disease     Widely patent previously placed stents in the left anterior   . Crohn's disease ([place])   . 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 Dyspnea   . Gastroparesis   . GERD (gastroesophageal reflux disease)   . High cholesterol   . Hyperlipidemia   . Hypersomnolent    Previous diagnosis of narcolepsy  . Hypertension, essential   . Ischemic cardiomyopathy    Ejection fraction 15-20% catheterization 2010  . Type II or unspecified type diabetes mellitus without mention of complication, not stated as uncontrolled   . Unspecified gastritis and gastroduodenitis without mention of hemorrhage     Assessment: LVAD Heartmate 3 and CKD on HD admitted wirh HTN, N/V. INR 5 on admit > 7.11 despite holding warfarin.  Today's INR down to 3.72.  LFTs are wnl, LDH  stable 200s. CBC stable, no overt bleeding or complications noted.    Goal of Therapy:  INR 2-3 Monitor platelets by anticoagulation protocol: Yes   Plan:  Continue to hold warfarin today. Will likely need to resume Coumadin tomorrow. Daily INR  [place] Crane, Vena [place], BCPS  Clinical Pharmacist Pager [phone]  07/15/2017 8:29 AM

## 2017-07-15 NOTE — Progress Notes (Signed)
Patient ID: [name], male   DOB: [birth year], 51 y.o.   MRN: [id number]    Advanced Heart Failure VAD Team Note  Subjective:    [name] is a 51 y.o. male with history of CAD s/p anterior MI in 2010 and NSTEMI in 3/18 with DES to pRCA and mLAD and ischemic cardiomyopathy now s/p Heartmate 3 LVAD and ESRD   He was admitted on 11/5 with about 24 hrs of intractable nausea/vomiting.  He had HD 11/5 in hospital, weight down.  Discussed with Dr. Posey Pronto and do not suspect volume overload (he has been getting down to dry weight with HD).   CT abdomen/pelvis did not show definite pathology though gallbladder not well-visualized.  RUQ [place] normal.   He went into atrial flutter with RVR 11/6 in the morning while vomiting.  He is now in NSR on amiodarone.   MAP lower 70s-90s now.  Taking po meds.    He started po erythromycin, now feeling much better.  Eating solids.  No nausea currently.  INR coming down.  EGD (11/7): Retained fluid, concern for gastroparesis.  Erythromycin started but unable to tolerate more than 1 dose of liquid due to nausea.   LVAD INTERROGATION:  HeartMate II LVAD:  Flow 4.7 liters/min, speed 5600, power 4.3, PI 2.8.    Objective:    Vital Signs:   Temp:  [97.5 F (36.4 C)-99 F (37.2 C)] 98.4 F (36.9 C) (11/10 0826) Pulse Rate:  [58-99] 89 (11/10 0826) Resp:  [15-22] 15 (11/10 0826) BP: (73-122)/(43-87) 90/71 (11/10 0849) SpO2:  [95 %-98 %] 98 % (11/10 0826) Weight:  [169 lb 5 oz (76.8 kg)-171 lb 8.3 oz (77.8 kg)] 170 lb 10.2 oz (77.4 kg) (11/10 0447) Last BM Date: 07/14/17 Mean arterial Pressure  70s-90s  Intake/Output:   Intake/Output Summary (Last 24 hours) at 07/15/2017 0938 Last data filed at 07/15/2017 0400 Gross per 24 hour  Intake 267.48 ml  Output 1000 ml  Net -732.52 ml     Physical Exam   GENERAL: Well appearing this am. NAD.  HEENT: Normal. NECK: Supple, JVP 7-8 cm. Carotids OK.  CARDIAC:  Mechanical heart sounds with LVAD  hum present.  LUNGS:  CTAB, normal effort.  ABDOMEN:  NT, ND, no HSM. No bruits or masses. +BS  LVAD exit site: Well-healed and incorporated. Dressing dry and intact. No erythema or drainage. Stabilization device present and accurately applied. Driveline dressing changed daily per sterile technique. EXTREMITIES:  Warm and dry. No cyanosis, clubbing, rash, or edema.  NEUROLOGIC:  Alert & oriented x 3. Cranial nerves grossly intact. Moves all 4 extremities w/o difficulty. Affect pleasant       Telemetry   NSR 80s (personally reviewed).     Labs   Basic Metabolic Panel: Recent Labs  Lab 07/11/17 0958 07/12/17 0240 07/13/17 0312 07/14/17 0327 07/15/17 0355  NA 132* 133* 134* 133* 131*  K 4.7 4.8 3.9 3.8 3.6  CL 94* 94* 96* 94* 94*  CO2 22 24 23 25 26   GLUCOSE 156* 121* 100* 130* 153*  BUN 23* 33* 16 34* 17  CREATININE 5.60* 7.11* 4.97* 7.16* 4.62*  CALCIUM 8.6* 8.7* 8.3* 8.3* 8.0*  PHOS 5.3* 5.7* 4.4  --  2.9    Liver Function Tests: Recent Labs  Lab 07/10/17 1107 07/11/17 0958 07/12/17 0240 07/13/17 0312 07/14/17 0327 07/15/17 0355  AST 26  --   --   --  22  --   ALT 15*  --   --   --  15*  --   ALKPHOS 135*  --   --   --  126  --   BILITOT 0.5  --   --   --  0.7  --   PROT 7.7  --   --   --  6.9  --   ALBUMIN 2.7* 2.6* 2.6* 2.5* 2.5* 2.4*   Recent Labs  Lab 07/11/17 0801  AMYLASE 141*   No results for input(s): AMMONIA in the last 168 hours.  CBC: Recent Labs  Lab 07/11/17 0958 07/12/17 0240 07/13/17 0312 07/14/17 0327 07/15/17 0355  WBC 11.8* 14.2* 9.8 11.5* 9.7  NEUTROABS  --  12.0* 8.0* 9.0* 7.3  HGB 9.9* 10.4* 10.7* 10.0* 10.2*  HCT 31.5* 32.2* 33.1* 31.5* 31.9*  MCV 80.8 78.9 80.5 79.5 80.2  PLT 355 401* 337 383 351    INR: Recent Labs  Lab 07/11/17 0801 07/12/17 0240 07/13/17 0312 07/14/17 0327 07/15/17 0355  INR 5.47* 6.81* 7.13* 7.11* 3.72    Other results:  EKG:    Imaging   Dg Chest 2 View  Result Date:  07/13/2017 CLINICAL DATA:  Weakness.  Recent LVAD placed. EXAM: CHEST  2 VIEW COMPARISON:  07/03/2017. FINDINGS: Poor inspiration. Stable enlarged cardiac silhouette. Mildly decreased opacity at the left lung base. Stable median sternotomy wires, LVAD, left subclavian pacer and AICD leads and right jugular double-lumen catheter with its tip in the inferior right atrium. Unremarkable bones. IMPRESSION: 1. Improving left basilar atelectasis. 2. Stable cardiomegaly. 3. Right jugular double-lumen catheter tip in the inferior right atrium. Electronically Signed   By: Claudie Revering M.D.   On: 07/13/2017 11:23     Medications:     Scheduled Medications: . amiodarone  200 mg Oral Daily  . [START ON 07/16/2017] amLODipine  2.5 mg Oral Once per day on Sun Tue Thu Sat  . atorvastatin  40 mg Oral q1800  . darbepoetin (ARANESP) injection - DIALYSIS  100 mcg Intravenous Q Mon-HD  . docusate sodium  200 mg Oral Daily  . erythromycin  250 mg Oral TID  . hydrALAZINE  37.5 mg Oral Q8H  . insulin aspart  0-15 Units Subcutaneous TID WC  . insulin aspart  0-5 Units Subcutaneous QHS  . metoCLOPramide  5 mg Oral TID AC  . pantoprazole  40 mg Oral Daily  . promethazine  12.5 mg Intravenous Q8H  . sildenafil  20 mg Oral TID    Infusions:   PRN Medications: acetaminophen, hydrALAZINE, LORazepam, ondansetron (ZOFRAN) IV, ondansetron, promethazine   Patient Profile   [name] is a 51 y.o. male with history of CAD s/p anterior MI in 2010 and NSTEMI in 3/18 with DES to pRCA and mLAD and ischemic cardiomyopathy now s/p Heartmate 3 LVAD and ESRD.   Admitted from clinic 07/10/17 with N/V with active vomiting and dry heaves as well as HTN.   Assessment/Plan:    1. Nausea/vomiting: Patient has a prior history of this when volume overloaded though weight was only up 1 lb since 11/1 at admission, and he did not seem to have improved much with fluid off at HD on 11/5.  Per nephrology, he has been near/at  his dry weight.  He has a history of diabetic gastroparesis which has led to severe GI symptoms in the past.  He additionally has Crohns disease but this has been quiescent.  No diarrhea, melena, BRBPR.  LFTs are normal.  Abdomen is nontender and he is afebrile.  CT abdomen/pelvis did not show  definite abnormality though GB not well-visualized, RUQ [place] was then done and was normal.  EGD 11/7 concerning for gastroparesis.  With treatment with erythromycin, he seems to be doing better and is now taking po.  - Continue erythromycin per GI, think this would be preferable to domperidone given arrhythmia risk => 4 additional days per GI.  - Continue Reglan 5 mg po tid, will need to take regularly at home (was not taking regularly prior).  - Protonix.  2. HTN: MAP improved today.  Lower now that he is not nauseated/vomiting.  - Will plan to give him amlodipine 2.5 mg (think we can lower dose) + hydralazine 37.5 mg tid on non-HD days.  Will need to see how he does in terms of MAP on HD days to determine medication need on HD days.    3. ESRD: HD MWF.  4. Chronic systolic CHF: Ischemic cardiomyopathy. St Jude ICD. NSTEMI in March 2018 with DES to LAD and RCA, complicated by cardiogenic shock, low output requiring milrinone.Echo 8/18 with EF 20-25%, moderate MR. PX (8/18) with severe functional impairment due to HF.He is now s/p HM3 LVAD placement. - Discussed volume status with renal => Dr. Posey Pronto thinks that he is hitting his dry weight without problems.  - LVAD parameters reviewed today and stable.    - MAP better.  - Given heterozygosity for factor V Leiden and history of clotting, he has been on ASA at 325 mg daily and coumadin with INR 2-3 goal.INR was high but now coming down, 3.72 today.  Hold warfarin for now and will also hold ASA until INR back down to goal range.    - Continue sildenafil.  5. CAD: NSTEMI in 3/18. LHC with 99% ulcerated lesion proximal RCA with left to right collaterals, 95% mid  LAD stenosis after mid LAD stent. s/p PCI to RCA and LAD on 11/25/16.  6. h/o DVTs:Anticoagulated.Dosing per pharm.  7.Diabetic gastroparesis: Suspect this drives current presentation.   8. DM: Will need close followup with PCP to tighten control as outpatient.  9. PAD: Moderate bilateral disease on ABIs in 8/18. Denies claudication currently. No change.  10. Atrial flutter: Currently back in NSR. Continue po amiodarone.    Continue ambulation.  Monitor today, if he continues to do well home tomorrow.    I reviewed the LVAD parameters from today, and compared the results to the patient's prior recorded data.  No programming changes were made.  The LVAD is functioning within specified parameters.  The patient performs LVAD self-test daily.  LVAD interrogation was negative for any significant power changes, alarms or PI events/speed drops.  LVAD equipment check completed and is in good working order.  Back-up equipment present.   LVAD education done on emergency procedures and precautions and reviewed exit site care.  Length of Stay: 5  Loralie Champagne, MD 07/15/2017, 9:38 AM  VAD Team --- VAD ISSUES ONLY--- Pager [phone] (7am - 7am)  Advanced Heart Failure Team  Pager [phone] (M-F; 7a - 4p)  Please contact [facility] for night-coverage after hours (4p -7a ) and weekends on [email or web address]

## 2017-07-15 NOTE — Evaluation (Signed)
Physical Therapy Evaluation Patient Details Name: [name] MRN: [id number] DOB: 17-May-1966 Today's Date: 07/15/2017   History of Present Illness  [name] is a 51 y.o. male admitted from HF clinic 07/10/17 with N/V with active vomiting and dry heaves as well as HTN.     PMH:  CAD, MI x2, ICM, CHF, s/p Heartmate 3 LVAD, ESRD on HD, DM, Crohn's disease, HTN, PAD  Clinical Impression  Patient presents with problems listed below.  Will benefit from acute PT to maximize functional mobility prior to return home with family.  Do not anticipate any f/u PT needs at discharge.    Follow Up Recommendations No PT follow up;Supervision/Assistance - 24 hour    Equipment Recommendations  None recommended by PT    Recommendations for Other Services       Precautions / Restrictions Precautions Precautions: Sternal Precaution Comments: LVAD 06/12/17 Restrictions Weight Bearing Restrictions: No      Mobility  Bed Mobility               General bed mobility comments: Patient in chair as PT entered room  Transfers Overall transfer level: Needs assistance Equipment used: 4-wheeled walker Transfers: Sit to/from Stand Sit to Stand: Min guard         General transfer comment: Min guard assist for safety.  Ambulation/Gait Ambulation/Gait assistance: Supervision Ambulation Distance (Feet): 280 Feet Assistive device: 4-wheeled walker Gait Pattern/deviations: Step-through pattern;Decreased stride length Gait velocity: decreased Gait velocity interpretation: Below normal speed for age/gender General Gait Details: Patient with slow steady gait.  Demonstrates safe use of RW.  Stairs            Wheelchair Mobility    Modified Rankin (Stroke Patients Only)       Balance Overall balance assessment: Needs assistance Sitting-balance support: No upper extremity supported;Feet supported Sitting balance-[name]: Good     Standing balance support: No upper  extremity supported Standing balance-[name]: Fair                               Pertinent Vitals/Pain Pain Assessment: No/denies pain    Home Living Family/patient expects to be discharged to:: Private residence Living Arrangements: Children Available Help at Discharge: Family;Available 24 hours/day Type of Home: House Home Access: Stairs to enter Entrance Stairs-Rails: None Entrance Stairs-Number of Steps: 2 Home Layout: Multi-level;Able to live on main level with bedroom/bathroom Home Equipment: Kasandra Knudsen - single point;Walker - 4 wheels      Prior Function Level of Independence: Independent with assistive device(s);Needs assistance   Gait / Transfers Assistance Needed: Uses rollator for gait  ADL's / Homemaking Assistance Needed: reports having assistance for LB dressing        Hand Dominance   Dominant Hand: Right    Extremity/Trunk Assessment   Upper Extremity Assessment Upper Extremity Assessment: Generalized weakness    Lower Extremity Assessment Lower Extremity Assessment: Generalized weakness       Communication   Communication: No difficulties  Cognition Arousal/Alertness: Awake/alert Behavior During Therapy: WFL for tasks assessed/performed Overall Cognitive Status: Within Functional Limits for tasks assessed                                        General Comments General comments (skin integrity, edema, etc.): Patient able to switch from [facility] to batteries, managing batteries/cases, battery jacket.  Needed assist to initially untighten power cords from [facility] (hands appeared weak).    Exercises     Assessment/Plan    PT Assessment Patient needs continued PT services  PT Problem List Decreased strength;Decreased activity tolerance;Decreased balance;Decreased mobility;Decreased knowledge of use of DME;Decreased knowledge of precautions       PT Treatment Interventions DME instruction;Gait training;Functional mobility  training;Therapeutic activities;Therapeutic exercise;Balance training;Stair training;Patient/family education    PT Goals (Current goals can be found in the Care Plan section)  Acute Rehab PT Goals Patient Stated Goal: to go home PT Goal Formulation: With patient Time For Goal Achievement: 07/22/17 Potential to Achieve Goals: Good    Frequency Min 3X/week   Barriers to discharge        Co-evaluation               AM-PAC PT "6 Clicks" Daily Activity  Outcome Measure Difficulty turning over in bed (including adjusting bedclothes, sheets and blankets)?: None Difficulty moving from lying on back to sitting on the side of the bed? : A Little Difficulty sitting down on and standing up from a chair with arms (e.g., wheelchair, bedside commode, etc,.)?: A Little Help needed moving to and from a bed to chair (including a wheelchair)?: A Little Help needed walking in hospital room?: A Little Help needed climbing 3-5 steps with a railing? : A Lot 6 Click Score: 18    End of Session Equipment Utilized During Treatment: Gait belt Activity Tolerance: Patient tolerated treatment well Patient left: in chair;with call bell/phone within reach Nurse Communication: Mobility status PT Visit Diagnosis: Unsteadiness on feet (R26.81);Other abnormalities of gait and mobility (R26.89);Muscle weakness (generalized) (M62.81)    Time: 8527-7824 PT Time Calculation (min) (ACUTE ONLY): 13 min   Charges:   PT Evaluation $PT Eval Moderate Complexity: 1 Mod     PT G Codes:        Carita Pian. Rosana Hoes PT, [facility] Acute Rehab Services Pager [place] 07/15/2017, 6:42 PM

## 2017-07-15 NOTE — Progress Notes (Signed)
Patient ID: [name], male   DOB: [birth year], 51 y.o.   MRN: [id number]  [facility] Progress Note   Assessment/ Plan:   1.  Nausea/vomiting: Likely from gastric dysmotility given EGD findings. Improving well on prokinetic therapy with metoclopramide and erythromycin-anticipate discharge soon. 2.  ESRD:  usually on a Monday/Wednesday/Friday schedule. underwent hemodialysis yesterday without problems. We had a lengthy discussion regarding restriction of fluid intake to 32 ounces/1.2 L a day while on hemodialysis given his minimal urine output. We also discussed the importance of sodium restriction has been key to success of fluid restriction. 3.  Hypertension/volume:  blood pressures reviewed overnight, fair control status post ultrafiltration with hemodialysis 4.  Anemia:  status post Aranesp for anemia-continue to monitor hemoglobin/hematocrit trend. No overt losses. 5.  Metabolic bone disease:  calcium/phosphorus levels currently at goal. Monitor for now. 6. Severe ischemic cardiomyopathy (s/p LVAD): Management per cardiology-LVAD coordinator rounding notes reviewed. 7. Type 2 DM 8. Crohn's Disease  Subjective:   Denies any nausea or vomiting overnight, tolerated hemodialysis without problems yesterday.    Objective:   BP 105/86 (BP Location: Left Arm)   Pulse 90   Temp (!) 97.5 F (36.4 C) (Oral)   Resp (!) 21   Ht 5' 8"  (1.727 m)   Wt 77.4 kg (170 lb 10.2 oz)   SpO2 97%   BMI 25.95 kg/m   Physical Exam: Gen: Comfortably resting in bed CVS: Regular rate and rhythm, mechanical heart sounds from LVAD Resp: Bilaterally anterior clear to auscultation, no rales/rhonchi  Abd: Soft, flat, nontender Ext: No lower extremity edema  Labs: BMET Recent Labs  Lab 07/10/17 1107 07/11/17 0958 07/12/17 0240 07/13/17 0312 07/14/17 0327 07/15/17 0355  NA 133* 132* 133* 134* 133* 131*  K 4.5 4.7 4.8 3.9 3.8 3.6  CL 96* 94* 94* 96* 94* 94*  CO2 22 22 24 23 25 26    GLUCOSE 220* 156* 121* 100* 130* 153*  BUN 49* 23* 33* 16 34* 17  CREATININE 8.84* 5.60* 7.11* 4.97* 7.16* 4.62*  CALCIUM 8.6* 8.6* 8.7* 8.3* 8.3* 8.0*  PHOS  --  5.3* 5.7* 4.4  --  2.9   CBC Recent Labs  Lab 07/12/17 0240 07/13/17 0312 07/14/17 0327 07/15/17 0355  WBC 14.2* 9.8 11.5* 9.7  NEUTROABS 12.0* 8.0* 9.0* 7.3  HGB 10.4* 10.7* 10.0* 10.2*  HCT 32.2* 33.1* 31.5* 31.9*  MCV 78.9 80.5 79.5 80.2  PLT 401* 337 383 351   Medications:    . amiodarone  200 mg Oral Daily  . amLODipine  5 mg Oral Once per day on Sun Tue Thu Sat  . atorvastatin  40 mg Oral q1800  . darbepoetin (ARANESP) injection - DIALYSIS  100 mcg Intravenous Q Mon-HD  . docusate sodium  200 mg Oral Daily  . erythromycin  250 mg Oral TID  . hydrALAZINE  37.5 mg Oral Q8H  . insulin aspart  0-15 Units Subcutaneous TID WC  . insulin aspart  0-5 Units Subcutaneous QHS  . metoCLOPramide  5 mg Oral TID AC  . pantoprazole  40 mg Oral Daily  . promethazine  12.5 mg Intravenous Q8H  . sildenafil  20 mg Oral TID   Elmarie Shiley, MD 07/15/2017, 7:31 AM

## 2017-07-16 LAB — RENAL FUNCTION PANEL
Albumin: 2.7 g/dL — ABNORMAL LOW (ref 3.5–5.0)
Anion gap: 14 (ref 5–15)
BUN: 31 mg/dL — AB (ref 6–20)
CHLORIDE: 92 mmol/L — AB (ref 101–111)
CO2: 24 mmol/L (ref 22–32)
Calcium: 8.3 mg/dL — ABNORMAL LOW (ref 8.9–10.3)
Creatinine, Ser: 6.88 mg/dL — ABNORMAL HIGH (ref 0.61–1.24)
GFR calc Af Amer: 10 mL/min — ABNORMAL LOW (ref >60)
GFR calc non Af Amer: 8 mL/min — ABNORMAL LOW (ref >60)
GLUCOSE: 99 mg/dL (ref 65–99)
POTASSIUM: 4.3 mmol/L (ref 3.5–5.1)
Phosphorus: 3 mg/dL (ref 2.5–4.6)
Sodium: 130 mmol/L — ABNORMAL LOW (ref 135–145)

## 2017-07-16 LAB — CBC WITH DIFFERENTIAL/PLATELET
Basophils Absolute: 0 10*3/uL (ref 0.0–0.1)
Basophils Relative: 0 %
EOS PCT: 2 %
Eosinophils Absolute: 0.2 10*3/uL (ref 0.0–0.7)
HCT: 31.2 % — ABNORMAL LOW (ref 39.0–52.0)
Hemoglobin: 10.4 g/dL — ABNORMAL LOW (ref 13.0–17.0)
LYMPHS ABS: 1.2 10*3/uL (ref 0.7–4.0)
LYMPHS PCT: 11 %
MCH: 26.6 pg (ref 26.0–34.0)
MCHC: 33.3 g/dL (ref 30.0–36.0)
MCV: 79.8 fL (ref 78.0–100.0)
MONO ABS: 1.2 10*3/uL — AB (ref 0.1–1.0)
Monocytes Relative: 11 %
Neutro Abs: 8.3 10*3/uL — ABNORMAL HIGH (ref 1.7–7.7)
Neutrophils Relative %: 76 %
PLATELETS: 340 10*3/uL (ref 150–400)
RBC: 3.91 MIL/uL — AB (ref 4.22–5.81)
RDW: 17.7 % — AB (ref 11.5–15.5)
WBC: 10.9 10*3/uL — ABNORMAL HIGH (ref 4.0–10.5)

## 2017-07-16 LAB — LACTATE DEHYDROGENASE: LDH: 347 U/L — AB (ref 98–192)

## 2017-07-16 LAB — PROTIME-INR
INR: 2
PROTHROMBIN TIME: 22.5 s — AB (ref 11.4–15.2)

## 2017-07-16 LAB — GLUCOSE, CAPILLARY: Glucose-Capillary: 101 mg/dL — ABNORMAL HIGH (ref 65–99)

## 2017-07-16 MED ORDER — AMLODIPINE BESYLATE 2.5 MG PO TABS: ORAL_TABLET | ORAL | 3 refills | Status: DC

## 2017-07-16 MED ORDER — ERYTHROMYCIN BASE 250 MG PO TABS: 250.0000 mg | ORAL_TABLET | Freq: Three times a day (TID) | ORAL | 0 refills | Status: DC

## 2017-07-16 MED ORDER — HYDRALAZINE HCL 25 MG PO TABS: ORAL_TABLET | ORAL | 3 refills | Status: DC

## 2017-07-16 MED ORDER — ATORVASTATIN CALCIUM 40 MG PO TABS: 40.0000 mg | ORAL_TABLET | Freq: Every day | ORAL | 6 refills | Status: DC

## 2017-07-16 MED ORDER — WARFARIN SODIUM 5 MG PO TABS: ORAL_TABLET | ORAL | 0 refills | Status: DC

## 2017-07-16 MED ORDER — WARFARIN SODIUM 5 MG PO TABS
5.0000 mg | ORAL_TABLET | Freq: Once | ORAL | Status: AC
  Administered 2017-07-16: 5 mg via ORAL
  Filled 2017-07-16: qty 1

## 2017-07-16 MED ORDER — WARFARIN - PHARMACIST DOSING INPATIENT: Freq: Every day | Status: DC

## 2017-07-16 MED ORDER — METOCLOPRAMIDE HCL 5 MG PO TABS: 5.0000 mg | ORAL_TABLET | Freq: Three times a day (TID) | ORAL | 3 refills | Status: DC

## 2017-07-16 MED ORDER — ASPIRIN 325 MG PO TABS
325.0000 mg | ORAL_TABLET | Freq: Every day | ORAL | Status: DC
  Administered 2017-07-16: 325 mg via ORAL
  Filled 2017-07-16: qty 1

## 2017-07-16 NOTE — Progress Notes (Signed)
Patient ID: [name], male   DOB: 25-Oct-1965, 51 y.o.   MRN: [id number]    Advanced Heart Failure VAD Team Note  Subjective:    [name] is a 51 y.o. male with history of CAD s/p anterior MI in 2010 and NSTEMI in 3/18 with DES to pRCA and mLAD and ischemic cardiomyopathy now s/p Heartmate 3 LVAD and ESRD   He was admitted on 11/5 with about 24 hrs of intractable nausea/vomiting.  He had HD 11/5 in hospital, weight down.  Discussed with Dr. Posey Pronto and do not suspect volume overload (he has been getting down to dry weight with HD).   CT abdomen/pelvis did not show definite pathology though gallbladder not well-visualized.  RUQ [place] normal.   He went into atrial flutter with RVR 11/6 in the morning while vomiting.  He is now in NSR on amiodarone.   MAP lower 70s-80s now.  Taking po meds.    He started po erythromycin, now feeling much better.  Eating solids.  No nausea currently.  INR has dropped down to 2.  EGD (11/7): Retained fluid, concern for gastroparesis.  Erythromycin started but unable to tolerate more than 1 dose of liquid due to nausea.   LVAD INTERROGATION:  HeartMate II LVAD:  Flow 4.3 liters/min, speed 5600, power 4.2, PI 3.1.    Objective:    Vital Signs:   Temp:  [97.4 F (36.3 C)-98.5 F (36.9 C)] 97.4 F (36.3 C) (11/11 0436) Pulse Rate:  [60-96] 93 (11/11 0436) Resp:  [15-20] 17 (11/11 0436) BP: (83-106)/(52-74) 96/74 (11/11 0436) SpO2:  [92 %-100 %] 97 % (11/11 0436) Weight:  [173 lb 4.5 oz (78.6 kg)] 173 lb 4.5 oz (78.6 kg) (11/11 0436) Last BM Date: 07/14/17 Mean arterial Pressure  70s-80s  Intake/Output:   Intake/Output Summary (Last 24 hours) at 07/16/2017 5329 Last data filed at 07/15/2017 1300 Gross per 24 hour  Intake 240 ml  Output -  Net 240 ml     Physical Exam   GENERAL: Well appearing this am. NAD.  HEENT: Normal. NECK: Supple, JVP 8-9 cm. Carotids OK.  CARDIAC:  Mechanical heart sounds with LVAD hum present.  LUNGS:   CTAB, normal effort.  ABDOMEN:  NT, ND, no HSM. No bruits or masses. +BS  LVAD exit site: Well-healed and incorporated. Dressing dry and intact. No erythema or drainage. Stabilization device present and accurately applied. Driveline dressing changed daily per sterile technique. EXTREMITIES:  Warm and dry. No cyanosis, clubbing, rash, or edema.  NEUROLOGIC:  Alert & oriented x 3. Cranial nerves grossly intact. Moves all 4 extremities w/o difficulty. Affect pleasant       Telemetry   NSR 80s (personally reviewed).     Labs   Basic Metabolic Panel: Recent Labs  Lab 07/11/17 0958 07/12/17 0240 07/13/17 0312 07/14/17 0327 07/15/17 0355 07/16/17 0254  NA 132* 133* 134* 133* 131* 130*  K 4.7 4.8 3.9 3.8 3.6 4.3  CL 94* 94* 96* 94* 94* 92*  CO2 22 24 23 25 26 24   GLUCOSE 156* 121* 100* 130* 153* 99  BUN 23* 33* 16 34* 17 31*  CREATININE 5.60* 7.11* 4.97* 7.16* 4.62* 6.88*  CALCIUM 8.6* 8.7* 8.3* 8.3* 8.0* 8.3*  PHOS 5.3* 5.7* 4.4  --  2.9 3.0    Liver Function Tests: Recent Labs  Lab 07/10/17 1107  07/12/17 0240 07/13/17 0312 07/14/17 0327 07/15/17 0355 07/16/17 0254  AST 26  --   --   --  22  --   --  ALT 15*  --   --   --  15*  --   --   ALKPHOS 135*  --   --   --  126  --   --   BILITOT 0.5  --   --   --  0.7  --   --   PROT 7.7  --   --   --  6.9  --   --   ALBUMIN 2.7*   < > 2.6* 2.5* 2.5* 2.4* 2.7*   < > = values in this interval not displayed.   Recent Labs  Lab 07/11/17 0801  AMYLASE 141*   No results for input(s): AMMONIA in the last 168 hours.  CBC: Recent Labs  Lab 07/12/17 0240 07/13/17 0312 07/14/17 0327 07/15/17 0355 07/16/17 0254  WBC 14.2* 9.8 11.5* 9.7 10.9*  NEUTROABS 12.0* 8.0* 9.0* 7.3 8.3*  HGB 10.4* 10.7* 10.0* 10.2* 10.4*  HCT 32.2* 33.1* 31.5* 31.9* 31.2*  MCV 78.9 80.5 79.5 80.2 79.8  PLT 401* 337 383 351 340    INR: Recent Labs  Lab 07/12/17 0240 07/13/17 0312 07/14/17 0327 07/15/17 0355 07/16/17 0254  INR 6.81*  7.13* 7.11* 3.72 2.00    Other results:  EKG:    Imaging   No results found.   Medications:     Scheduled Medications: . amiodarone  200 mg Oral Daily  . amLODipine  2.5 mg Oral Once per day on Sun Tue Thu Sat  . aspirin  325 mg Oral Daily  . atorvastatin  40 mg Oral q1800  . darbepoetin (ARANESP) injection - DIALYSIS  100 mcg Intravenous Q Mon-HD  . docusate sodium  200 mg Oral Daily  . erythromycin  250 mg Oral TID  . hydrALAZINE  37.5 mg Oral Q8H  . insulin aspart  0-15 Units Subcutaneous TID WC  . insulin aspart  0-5 Units Subcutaneous QHS  . metoCLOPramide  5 mg Oral TID AC  . pantoprazole  40 mg Oral Daily  . promethazine  12.5 mg Intravenous Q8H  . sildenafil  20 mg Oral TID    Infusions:   PRN Medications: acetaminophen, hydrALAZINE, LORazepam, ondansetron (ZOFRAN) IV, ondansetron, promethazine   Patient Profile   [name] is a 51 y.o. male with history of CAD s/p anterior MI in 2010 and NSTEMI in 3/18 with DES to pRCA and mLAD and ischemic cardiomyopathy now s/p Heartmate 3 LVAD and ESRD.   Admitted from clinic 07/10/17 with N/V with active vomiting and dry heaves as well as HTN.   Assessment/Plan:    1. Nausea/vomiting: Patient has a prior history of this when volume overloaded though weight was only up 1 lb since 11/1 at admission, and he did not seem to have improved much with fluid off at HD on 11/5.  Per nephrology, he has been near/at his dry weight.  He has a history of diabetic gastroparesis which has led to severe GI symptoms in the past.  He additionally has Crohns disease but this has been quiescent.  No diarrhea, melena, BRBPR.  LFTs are normal.  Abdomen is nontender and he is afebrile.  CT abdomen/pelvis did not show definite abnormality though GB not well-visualized, RUQ [place] was then done and was normal.  EGD 11/7 concerning for gastroparesis.  With treatment with erythromycin, he seems to be doing better and is now taking po.  -  Continue erythromycin per GI, think this would be preferable to domperidone given arrhythmia risk => 3 additional days per  GI.  - Continue Reglan 5 mg po tid, will need to take regularly at home (was not taking regularly prior).  - Protonix.  2. HTN: MAP improved today.  Lower now that he is not nauseated/vomiting.  - Will plan to give him amlodipine 2.5 mg + hydralazine 37.5 mg tid on non-HD days.  Will need to see how he does in terms of MAP on HD days to determine medication need on HD days.    3. ESRD: HD MWF.  4. Chronic systolic CHF: Ischemic cardiomyopathy. [employer] ICD. NSTEMI in March 2018 with DES to LAD and RCA, complicated by cardiogenic shock, low output requiring milrinone.Echo 8/18 with EF 20-25%, moderate MR. PX (8/18) with severe functional impairment due to HF.He is now s/p HM3 LVAD placement. - Discussed volume status with renal => Dr. Posey Pronto thinks that he is hitting his dry weight without problems.  - LVAD parameters reviewed today and stable.    - MAP better.  - INR down to 2, restart warfarin and restart ASA 325 daily.   - Continue sildenafil.  5. CAD: NSTEMI in 3/18. LHC with 99% ulcerated lesion proximal RCA with left to right collaterals, 95% mid LAD stenosis after mid LAD stent. s/p PCI to RCA and LAD on 11/25/16.  6. h/o DVTs:Anticoagulated.Dosing per pharm.  7.Diabetic gastroparesis: Suspect this drives current presentation.   8. DM: Will need close followup with PCP to tighten control as outpatient.  9. PAD: Moderate bilateral disease on ABIs in 8/18. Denies claudication currently. No change.  10. Atrial flutter: Currently back in NSR. Continue po amiodarone.   11. Disposition: He may go home today.  He will need to go to HD tomorrow (on MWF schedule).  He will need followup in LVAD clinic for nursing visit and INR on Tuesday or Wednesday.  Meds for home: hydralazine 37.5 mg tid on non-HD days and in the evening on HD days, amlodipine 2.5 on non-HD days,  warfarin goal INR 2-3, ASA 325 daily, Reglan 5 mg tid with meals, erythromycin 250 mg tid x 3 more days, atorvastatin 40 daily, amiodarone 200 daily, Protonix 40 daily, sildenafil 20 tid, home diabetes regimen.   I reviewed the LVAD parameters from today, and compared the results to the patient's prior recorded data.  No programming changes were made.  The LVAD is functioning within specified parameters.  The patient performs LVAD self-test daily.  LVAD interrogation was negative for any significant power changes, alarms or PI events/speed drops.  LVAD equipment check completed and is in good working order.  Back-up equipment present.   LVAD education done on emergency procedures and precautions and reviewed exit site care.  Length of Stay: 6  Loralie Champagne, MD 07/16/2017, 6:33 AM  VAD Team --- VAD ISSUES ONLY--- Pager [phone] (7am - 7am)  Advanced Heart Failure Team  Pager [phone] (M-F; 7a - 4p)  Please contact [facility] for night-coverage after hours (4p -7a ) and weekends on [email or web address]

## 2017-07-16 NOTE — Care Management Note (Signed)
Case Management Note  Patient Details  Name: [name] MRN: [id number] Date of Birth: 28-Mar-1966  Subjective/Objective:  Active with [facility] for HHPT, HHOT and RN with INR due Tuesday per Cardiology. CM LM for MD to Place orders if discharging today. Brenton Grills is aware.                   Action/Plan:CM will sign off for now but will be available should additional discharge needs arise or disposition change.    Expected Discharge Date:  07/16/17               Expected Discharge Plan:  [place]  In-House Referral:     Discharge planning Services  CM Consult  Post Acute Care Choice:  Home Health Choice offered to:  Patient  DME Arranged:    DME Agency:     HH Arranged:  RN, PT, OT(INR on Tuesday) [place]:  [place]  Status of Service:  Completed, signed off  If discussed at [place] of Stay Meetings, dates discussed:    Additional Comments:  Delrae Sawyers, RN 07/16/2017, 10:49 AM

## 2017-07-16 NOTE — Progress Notes (Signed)
Patient ID: [name], male   DOB: [birth year], 51 y.o.   MRN: [id number] BP 95/84 (BP Location: Left Arm)   Pulse 89   Temp 98.8 F (37.1 C) (Oral)   Resp 14   Ht 5' 8"  (1.727 m)   Wt 78.6 kg (173 lb 4.5 oz)   SpO2 98%   BMI 26.35 kg/m   Patient planned for discharge home today. Reminded him about 32oz daily fluid restriction and encouraged compliance with hemodialysis.  Elmarie Shiley MD [facility]. Office # [phone] Pager # [phone] 9:23 AM

## 2017-07-16 NOTE — Progress Notes (Signed)
RN notified per [facility] pharmacist that a hard stop was initiated for the Arythromycin prescription and that verification was required before processing the prescription for the patient.  RN given permission by Zada Girt LVAD coordinator per Dr. Aundra Dubin for the patient to receive the prescription.  Patient will follow up with Dr. Marigene Ehlers outpatient.

## 2017-07-16 NOTE — Discharge Summary (Signed)
Discharge Summary    Patient ID: [name],  MRN: [id number], DOB/AGE: July 15, 1966 51 y.o.  Admit date: 07/10/2017 Discharge date: 07/16/2017  Primary Care Provider: [name] Primary Cardiologist: Dr. 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 Metformin And Related Diarrhea    Diagnostic Studies/Procedures         History of Present Illness   [name] is a 51 y.o. male with history of CAD s/p anterior MI in 2010 and NSTEMI in 3/18 with DES to pRCA and mLAD and ischemic cardiomyopathy now s/p Heartmate 3 LVAD and ESRD. Pt presented 07/10/17  as add on due to N/V beginning yesterday morning. Unable to take medications  Dry heaving currently. MAP > 100. Admitted for further evaluation.   Patient developed low output heart failure. He was not deemed to be a good transplant candidate. He was admitted in 10/18 and Heartmate 3 LVAD was placed. He had baseline CKD stage 3 likely from diabetic nephropathy and CHF. He developed peri-op AKI and ended up on dialysis. No renal recovery to date. St Jude ICD has been turned back on and ramp echo was done prior to discharge in 10/18, speed increased to 5600 rpm.   Seen in clinic 07/06/[facility]. BP noted to be running high with MAP 97. Hydralazine increased.   LVAD INTERROGATION:  HeartMate II LVAD:  Flow 3.8 liters/min, speed 5600, power 4.2, PI 5.7.  No PI events.   Hgb 9.1   INR 5.09  Hospital Course     Consultants: GI and nephrology   1.Nausea/vomiting:  - Patient has a prior history of this when volume overloaded though weight was only up 1 lb since 11/1 at admission, and he  did not seem to have improved much with fluid off at HD on 11/5.  Per nephrology, he has been near/at his dry weight. He has a history of diabetic gastroparesis which has led to severe GI symptoms in the past. He additionally has Crohns disease but this has been quiescent.  No diarrhea, melena, BRBPR.  LFTs are normal.  Abdomen is nontender and he is afebrile.  CT abdomen/pelvis did not show definite abnormality though GB not well-visualized, RUQ [place] was then done and was normal.  EGD 11/7 concerning for gastroparesis.  With treatment with erythromycin, he seems to be doing better and is now taking po.  - Continue erythromycin per GI, think this would be preferable to domperidone given arrhythmia risk, continue for 3 additional days per GI.  - Continue Reglan 5 mg po tid, will need to take regularly at home (was not taking regularly prior).  - Protonix.   2. HTN:  - MAP iimproving. Lower now that he is not nauseated/vomiting.  - Will plan to give him amlodipine 2.5 mg + hydralazine 37.5 mg tid on non-HD days.  Will need to see how he does in terms of MAP on HD days to determine medication need on HD days.     3. ESRD: -HD MWF.   4. Chronic systolic CHF: Ischemic cardiomyopathy s/p St Jude ICD.  - Discussed volume status with renal => Dr. Posey Pronto thinks  that he is hitting his dry weight without problems.  - LVAD parameters stable.  -MAPbetter. - INR down to 2, restart warfarin and restart ASA 325 daily.  - Continue sildenafil.  5. CAD:  - NSTEMI in 3/18. LHC with 99% ulcerated lesion proximal RCA with left to right collaterals, 95% mid LAD stenosis after mid LAD stent. s/p PCI to RCA and LAD on 11/25/16.   6. h/o DVTs:Anticoagulated.Dosing per pharm.   7.Diabetic gastroparesis:Suspect this drives current presentation.  8. DM: - Will need close followup with PCP to tighten control as outpatient.   9. PAD:  - Moderate bilateral disease on ABIs in 8/18. Denies claudication  currently.No change.  10. Atrial flutter: Currently back in NSR. Continue po amiodarone.    11. Disposition: Home today.  He will need to go to HD tomorrow (on MWF schedule).  He will need followup in LVAD clinic for nursing visit and INR on Tuesday or Wednesday (Spoke with LVAD coordinator who will arrange follow up)  Meds for home: hydralazine 37.5 mg tid on non-HD days and in the evening on HD days, amlodipine 2.5 on non-HD days, warfarin goal INR 2-3, ASA 325 daily, Reglan 5 mg tid with meals, erythromycin 250 mg tid x 3 more days, atorvastatin 40 daily, amiodarone 200 daily, Protonix 40 daily, sildenafil 20 tid, home diabetes regimen.    The patient has been seen by Dr. Aundra Dubin  today and deemed ready for discharge home. All follow-up appointments have been scheduled. Discharge medications are listed below.    Discharge Vitals Blood pressure 95/84, pulse 89, temperature 98.8 F (37.1 C), temperature source Oral, resp. rate 14, height 5' 8"  (1.727 m), weight 173 lb 4.5 oz (78.6 kg), SpO2 98 %.  Filed Weights   07/14/17 1416 07/15/17 0447 07/16/17 0436  Weight: 169 lb 5 oz (76.8 kg) 170 lb 10.2 oz (77.4 kg) 173 lb 4.5 oz (78.6 kg)    Labs & Radiologic Studies     CBC Recent Labs    07/15/17 0355 07/16/17 0254  WBC 9.7 10.9*  NEUTROABS 7.3 8.3*  HGB 10.2* 10.4*  HCT 31.9* 31.2*  MCV 80.2 79.8  PLT 351 712   Basic Metabolic Panel Recent Labs    07/15/17 0355 07/16/17 0254  NA 131* 130*  K 3.6 4.3  CL 94* 92*  CO2 26 24  GLUCOSE 153* 99  BUN 17 31*  CREATININE 4.62* 6.88*  CALCIUM 8.0* 8.3*  PHOS 2.9 3.0   Liver Function Tests Recent Labs    07/14/17 0327 07/15/17 0355 07/16/17 0254  AST 22  --   --   ALT 15*  --   --   ALKPHOS 126  --   --   BILITOT 0.7  --   --   PROT 6.9  --   --   ALBUMIN 2.5* 2.4* 2.7*     Ct Abdomen Pelvis Wo Contrast  Result Date: 07/11/2017 CLINICAL DATA:  51 year old male with abdominal pain/intractable nausea and vomiting.  Ischemic cardiomyopathy with left ventricular assist device in place. End-stage renal disease. Prior appendectomy. Initial encounter. EXAM: CT ABDOMEN AND PELVIS WITHOUT CONTRAST TECHNIQUE: Multidetector CT imaging of the abdomen and pelvis was performed following the standard protocol without IV contrast. COMPARISON:  06/27/2017 chest CT. 04/28/2017 CT of the abdomen and pelvis. FINDINGS: Lower chest: Small to slightly moderate-size left-sided pleural effusion. Basilar atelectasis greater on left. Cardiomegaly. Trace pericardial fluid. Left ventricular assist device and AICD/ pacer in place. Hepatobiliary: Taking into account limitation  by non contrast imaging, no worrisome hepatic lesion. Dense appearance of the gallbladder may represent vicarious excretion of contrast. Pancreas: Taking into account limitation by non contrast imaging, no pancreatic mass or inflammation. Spleen: Taking into account limitation by non contrast imaging, no splenic mass or enlargement. Adrenals/Urinary Tract: No obstructing stone or hydronephrosis. Taking into account limitation by non contrast imaging, no renal or adrenal mass. Partially decompressed urinary bladder with circumferential wall thickening without polypoid lesion noted. Stomach/Bowel: Postsurgical changes right colon. Portions of bowel under distended however, no extraluminal bowel inflammatory process detected. Duodenal diverticulum incidentally noted. Vascular/Lymphatic: Vascular calcifications without aortic aneurysm. No adenopathy. Reproductive: Negative. Other: No free intraperitoneal air. Musculoskeletal: Schmorl's node deformity inferior endplate L1. No acute osseous abnormality. IMPRESSION: Postsurgical changes right colon. Portions of bowel are under distended however no extraluminal bowel inflammatory process is noted. Dense gallbladder may represent vicarious excretion of contrast. If primary gallbladder abnormality were of concern, ultrasound may then be  considered. Cardiomegaly with trace pericardial fluid. Left ventricular assist device and pacemaker in place. Small to moderate-size left-sided pleural effusion with basilar atelectasis greater on left. Aortic Atherosclerosis (ICD10-I70.0). Electronically Signed   By: Genia Del M.D.   On: 07/11/2017 11:28   Dg Chest 1 View  Result Date: 06/28/2017 CLINICAL DATA:  Status post left-sided thoracentesis. EXAM: CHEST 1 VIEW COMPARISON:  06/27/2017. FINDINGS: Right chest wall dialysis catheter is noted with tips in the right atrium. Left ventricular assist device is noted. There is a left chest wall ICD with lead in the right atrial appendage and right ventricle. Moderate cardiac enlargement. Left pleural effusion is identified. No pneumothorax status post left thoracentesis. IMPRESSION: 1. No pneumothorax visualized status post left thoracentesis. Electronically Signed   By: Kerby Moors M.D.   On: 06/28/2017 15:18   Dg Chest 2 View  Result Date: 07/13/2017 CLINICAL DATA:  Weakness.  Recent LVAD placed. EXAM: CHEST  2 VIEW COMPARISON:  07/03/2017. FINDINGS: Poor inspiration. Stable enlarged cardiac silhouette. Mildly decreased opacity at the left lung base. Stable median sternotomy wires, LVAD, left subclavian pacer and AICD leads and right jugular double-lumen catheter with its tip in the inferior right atrium. Unremarkable bones. IMPRESSION: 1. Improving left basilar atelectasis. 2. Stable cardiomegaly. 3. Right jugular double-lumen catheter tip in the inferior right atrium. Electronically Signed   By: Claudie Revering M.D.   On: 07/13/2017 11:23   Dg Chest 2 View  Result Date: 07/03/2017 CLINICAL DATA:  Left ventricular assist device placed 2 weeks ago. No current chest complaints. History of ischemic cardiomyopathy, diabetes, hypertension. EXAM: CHEST  2 VIEW COMPARISON:  Portable chest x-ray of June 29, 2017 FINDINGS: The lungs are less well inflated today. There is persistent increased density at  the left lung base with obscuration of the hemidiaphragm. The cardiac silhouette is enlarged. The left ventricular assist device as well as the ICD are in stable position. The dual-lumen dialysis catheter tip projects at the cavoatrial junction. IMPRESSION: Increased hypoinflation today. Persistent left basilar atelectasis or infiltrate with small left pleural effusion. Stable cardiomegaly without pulmonary vascular congestion. The support devices are in stable position. Electronically Signed   By: David  Martinique M.D.   On: 07/03/2017 07:30   Dg Chest 2 View  Result Date: 06/27/2017 CLINICAL DATA:  Left ventricular assist device. Shortness of breath . EXAM: CHEST  2 VIEW COMPARISON:  06/26/2017 . FINDINGS: Dialysis catheter noted in unchanged position with tip in the right atrium. Left ventricular assist device in stable position. Cardiac pacer stable  position. Prior median sternotomy. Cardiomegaly. Mild bilateral interstitial prominence noted suggesting mild component CHF. Persistent atelectasis/infiltrate left lung base. Improvement of left-sided pleural effusion. IMPRESSION: 1. Dialysis catheter noted in unchanged position with tip in the right atrium. Left ventricular assist device in stable position. Cardiac pacer in stable position. Prior median sternotomy. 2. Cardiomegaly with mild bilateral interstitial prominence. A mild component of CHF cannot be excluded. Interim improvement of left-sided pleural effusion. 3. Persistent left lower lobe atelectasis/consolidation . Electronically Signed   By: Marcello Moores  Register   On: 06/27/2017 06:40   Ct Chest Without Contrast  Result Date: 06/27/2017 CLINICAL DATA:  51 year old male with LVAD placement. Evaluate position. Subsequent encounter. EXAM: CT CHEST WITHOUT CONTRAST TECHNIQUE: Multidetector CT imaging of the chest was performed following the standard protocol without IV contrast. COMPARISON:  06/27/2017 chest x-ray.  04/28/2017 chest CT. FINDINGS:  Cardiovascular: Evaluation limited by lack of contrast. Additionally significant streak artifact from left ventricular assist device. Inflow cannula at the apex of the left ventricle is difficult to assess secondary to significant streak artifact. Outflow catheter ascending aortic level. Cardiomegaly. AICD/sequential pacer in place with leads in the region right atrium and right ventricle. Right dialysis catheter extends through the right atrium with the tip at the level of the inferior vena cava/right atrium junction. Prominent 3 vessel coronary artery calcification. Partial calcification ascending thoracic aorta. Mediastinum/Nodes: No adenopathy. Lungs/Pleura: Pre-surgical identified pulmonary nodules not well delineated on the present post surgical exam. Follow-up as previously noted. Interval development of moderately large left-sided pleural effusion and lingula/left lower lobe consolidation which may represent passive atelectasis with air bronchograms. The left-sided pleural effusion does not appear significantly dense as may be expected with acute blood from leak secondary to LVAD surgery. Correlation with hemoglobin recommended. Right lower lobe and posterior upper lobe subsegmental atelectasis. Upper Abdomen: Postsurgical changes. Musculoskeletal: Post sternotomy. IMPRESSION: Post LVAD placement. Inflow cannula at the apex of the left ventricle is difficult to assess secondary to significant streak artifact. Outflow catheter ascending aortic level. Cardiomegaly. AICD/sequential pacer in place with leads in the region right atrium and right ventricle. Right dialysis catheter extends through the right atrium with the tip at the level of the inferior vena cava/right atrium junction. Interval development of moderately large left-sided pleural effusion and lingula/left lower lobe consolidation suggestive of atelectasis (infiltrate secondary consideration). The left-sided pleural effusion does not appear  significantly dense as may be expected with acute blood from leak secondary to LVAD surgery. Correlation with hemoglobin/vital signs recommended. Right lower lobe and posterior upper lobe subsegmental atelectasis. These results were called by telephone at the time of interpretation on 06/27/2017 at 10:41 am to [facility] patients nurse, who verbally acknowledged these results. Electronically Signed   By: Genia Del M.D.   On: 06/27/2017 10:43   Dg Chest Port 1 View  Result Date: 06/29/2017 CLINICAL DATA:  Left ventricular assist device. EXAM: PORTABLE CHEST 1 VIEW COMPARISON:  06/28/2017 . FINDINGS: Dual-lumen right IJ catheter noted with lead tip over the right ventricle. Cardiac pacer with lead tips over the right atrium right ventricle. Left ventricular assist device noted. Stable cardiomegaly. Persistent low lung volumes with mild bibasilar atelectasis/infiltrates. Persistent small left pleural effusion cannot be excluded. IMPRESSION: 1. Dual-lumen right IJ catheter with lead tip over the right atrium. 2. Prior CABG. Left ventricular assist device noted in stable position. Stable cardiomegaly. 2. Persistent low lung volumes with persistent basilar atelectasis/infiltrates. No change. Electronically Signed   By: Marcello Moores  Register   On: 06/29/2017 07:57  Dg Chest Port 1 View  Result Date: 06/26/2017 CLINICAL DATA:  End-stage renal disease. EXAM: PORTABLE CHEST 1 VIEW COMPARISON:  Radiograph of June 24, 2017. FINDINGS: Stable cardiomegaly. Left-sided pacemaker and left ventricular assistance device are unchanged in position. No pneumothorax is noted. Mild left basilar atelectasis and effusion is again noted. Right lung is clear. Interval placement of right internal jugular dialysis catheter with distal tip in expected position of right atrium. Bony thorax is unremarkable. IMPRESSION: Mild left basilar atelectasis or effusion is noted. Stable position of left-sided pacemaker and left ventricular  assistance device. Stable left basilar atelectasis and effusion is noted. Interval placement of right internal jugular dialysis catheter with distal tip in expected position of right atrium. Electronically Signed   By: Marijo Conception, M.D.   On: 06/26/2017 16:09   Dg Chest Port 1 View  Result Date: 06/24/2017 CLINICAL DATA:  Follow-up LVAD EXAM: PORTABLE CHEST 1 VIEW COMPARISON:  06/23/2017 FINDINGS: Cardiac shadow is enlarged. Left ventricular assist device is again seen and stable. Defibrillator and right jugular dialysis catheter are seen. The small bore right subclavian central line has been removed in the interval. The overall inspiratory effort is again poor with stable changes in the left base. No new focal abnormality is noted. IMPRESSION: No significant interval change from the prior exam aside from removal of right subclavian central line. Electronically Signed   By: Inez Catalina M.D.   On: 06/24/2017 07:01   Dg Chest Port 1 View  Result Date: 06/23/2017 CLINICAL DATA:  Left chest soreness, LVAD EXAM: PORTABLE CHEST 1 VIEW COMPARISON:  06/22/2017 FINDINGS: Support devices including LVAD are stable. Cardiomegaly. Left lower lobe atelectasis or infiltrate. Cannot exclude left effusion. Minimal right base atelectasis. IMPRESSION: No significant change since prior study. Electronically Signed   By: Rolm Baptise M.D.   On: 06/23/2017 07:24   Dg Chest Port 1 View  Result Date: 06/22/2017 CLINICAL DATA:  LVAD.  Sore chest EXAM: PORTABLE CHEST 1 VIEW COMPARISON:  Two days ago FINDINGS: Right-sided central lines with tip at the upper right atrium. Dual-chamber ICD/ pacer in stable position. An LVAD is present. Low volume chest with atelectatic opacities greater on the left. Probable small left effusion. No pneumothorax. IMPRESSION: 1. Stable from 2 days prior. 2. Low volume chest with left more than right atelectasis. Electronically Signed   By: Monte Fantasia M.D.   On: 06/22/2017 07:28   Dg  Chest Port 1 View  Result Date: 06/20/2017 CLINICAL DATA:  Weakness EXAM: PORTABLE CHEST 1 VIEW COMPARISON:  06/19/2017 FINDINGS: Defibrillator, right jugular dialysis catheter, right subclavian central line and left ventricular assist device are again noted and stable. Cardiac shadow remains enlarged. Postsurgical changes are again seen. The lungs are well aerated bilaterally with patchy opacities particularly in the left mid and lower lung stable from the prior exam. No pneumothorax is noted. IMPRESSION: Tubes and lines as described. Patchy opacities within the left lung stable from the prior study. Electronically Signed   By: Inez Catalina M.D.   On: 06/20/2017 08:21   Dg Chest Port 1 View  Result Date: 06/19/2017 CLINICAL DATA:  51 year old male with weakness and sore chest. Subsequent encounter. EXAM: PORTABLE CHEST 1 VIEW COMPARISON:  06/18/2017. FINDINGS: Left ventricular assist device and AICD/pacer in place unchanged in position cardiomegaly. Coronary artery disease. Left chest tube removed without gross pneumothorax. Two right central lines are in place. Right internal jugular catheter tip mid superior vena cava level. Right subclavian line tip right  atrium level. To be within the distal superior vena cava, this would need to be retracted by 5 cm. Asymmetric airspace disease greater left possibly representing asymmetric pulmonary edema minimally more notable than on prior exam. Slightly poor inspiration. Question left base subsegmental atelectasis. IMPRESSION: Cardiomegaly with left ventricular assist device in place. Right subclavian line tip right atrium level. To be within the distal superior vena cava, this would need to be retracted by 5 cm. Left chest tube removed without gross pneumothorax. Asymmetric airspace disease greater left possibly representing asymmetric pulmonary edema minimally more notable than on prior exam. Electronically Signed   By: Genia Del M.D.   On: 06/19/2017 07:28    Dg Chest Port 1 View  Result Date: 06/18/2017 CLINICAL DATA:  Chest tube, left ventricular assist device. EXAM: PORTABLE CHEST 1 VIEW COMPARISON:  Radiograph of June 17, 2017. FINDINGS: Stable cardiomediastinal silhouette. Left ventricular assistance device is unchanged in position. Left-sided pacemaker is also unchanged. Stable position of right internal jugular and subclavian catheters. No pneumothorax is noted. Left-sided chest tube is unchanged in position. Stable bibasilar subsegmental atelectasis. Bony thorax is unremarkable. IMPRESSION: Stable support apparatus including left ventricular assistance device. Left-sided chest tube is unchanged without evidence pneumothorax. Stable bibasilar subsegmental listhesis. Electronically Signed   By: Marijo Conception, M.D.   On: 06/18/2017 07:12   Dg Chest Port 1 View  Result Date: 06/17/2017 CLINICAL DATA:  Left ventricular assistance device. EXAM: PORTABLE CHEST 1 VIEW COMPARISON:  Radiograph of June 16, 2017. FINDINGS: Stable cardiomegaly. Left ventricular assistance device is unchanged in position. Left-sided chest tube is noted without pneumothorax. Right subclavian and internal jugular catheters are unchanged in position. Left-sided pacemaker is unchanged in position. Mild central pulmonary vascular congestion is noted. Mild bibasilar subsegmental atelectasis is noted. Bony thorax is unremarkable. IMPRESSION: Stable support apparatus including left ventricular assist device. Stable cardiomegaly and central pulmonary vascular congestion. Stable bibasilar subsegmental atelectasis. Left-sided chest tube is noted without pneumothorax. Electronically Signed   By: Marijo Conception, M.D.   On: 06/17/2017 07:29   Dg Fluoro Guide Cv Line-no Report  Result Date: 06/26/2017 Fluoroscopy was utilized by the requesting physician.  No radiographic interpretation.   US Abdomen Limited Ruq  Result Date: 07/12/2017 CLINICAL DATA:  Nausea and vomiting.  History of hypertension, diabetes, GERD. EXAM: ULTRASOUND ABDOMEN LIMITED RIGHT UPPER QUADRANT COMPARISON:  None. FINDINGS: Gallbladder: No gallstones or wall thickening visualized. No sonographic Murphy sign noted by sonographer. Common bile duct: Diameter: 3 mm Liver: No focal lesion identified. Within normal limits in parenchymal echogenicity. Portal vein is patent on color Doppler imaging with normal direction of blood flow towards the liver. IMPRESSION: Normal right upper quadrant ultrasound. Electronically Signed   By: Franki Cabot M.D.   On: 07/12/2017 11:55   Ir Thoracentesis Asp Pleural Space W/img Guide  Result Date: 06/28/2017 INDICATION: Patient status post LVAD placement 06/15/2017. Now with left pleural effusion. Request is made for diagnostic and therapeutic thoracentesis. EXAM: ULTRASOUND GUIDED DIAGNOSTIC AND THERAPEUTIC LEFT THORACENTESIS MEDICATIONS: 10 mL 1% lidocaine COMPLICATIONS: None immediate. PROCEDURE: An ultrasound guided thoracentesis was thoroughly discussed with the patient and questions answered. The benefits, risks, alternatives and complications were also discussed. The patient understands and wishes to proceed with the procedure. Written consent was obtained. Ultrasound was performed to localize and mark an adequate pocket of fluid in the left chest. The area was then prepped and draped in the normal sterile fashion. 1% Lidocaine was used for local anesthesia. Under ultrasound guidance  a Safe-T-Centesis catheter was introduced. Thoracentesis was performed. The catheter was removed and a dressing applied. FINDINGS: A total of approximately 1.0 liters of blood-tinged fluid was removed. Samples were sent to the laboratory as requested by the clinical team. IMPRESSION: Successful ultrasound guided diagnostic and therapeutic left thoracentesis yielding 1.0 liters of pleural fluid. Read by:  Brynda Greathouse PA-C Electronically Signed   By: Jerilynn Mages.  Shick M.D.   On: 06/28/2017 15:27     Disposition   Pt is being discharged home today in good condition.  Follow-up Plans & Appointments    Follow-up Information    [facility] Follow up.   Specialty:  Cardiology Why:  office will call with time of appointment for Tuesday 07/18/17 Contact information: [address] [phone] [place]         Discharge Instructions    Diet - low sodium heart healthy   Complete by:  As directed    Diet - low sodium heart healthy   Complete by:  As directed    Discharge instructions   Complete by:  As directed    HD tomorrow (Monday) LVAD clinic visit Tuesday with INR check  Take Warfarin 82m tonight and  tomorrow   Increase activity slowly   Complete by:  As directed    Increase activity slowly   Complete by:  As directed       Discharge Medications   Current Discharge Medication List    START taking these medications   Details  amLODipine (NORVASC) 2.5 MG tablet Take 1 tablet (2.548m on Tuesday, Thursday, Saturday and Sunday (non HD days) by month Qty: 30 tablet, Refills: 3    erythromycin (E-MYCIN) 250 MG tablet Take 1 tablet (250 mg total) 3 (three) times daily by mouth. Qty: 9 tablet, Refills: 0      CONTINUE these medications which have CHANGED   Details  atorvastatin (LIPITOR) 40 MG tablet Take 1 tablet (40 mg total) daily at 6 PM by mouth. Qty: 30 tablet, Refills: 6    hydrALAZINE (APRESOLINE) 25 MG tablet Take 37.5 mg three times a day on non hemodialysis days and in the evening on dialysis days. Qty: 95 tablet, Refills: 3    metoCLOPramide (REGLAN) 5 MG tablet Take 1 tablet (5 mg total) 3 (three) times daily before meals by mouth. Qty: 90 tablet, Refills: 3    warfarin (COUMADIN) 5 MG tablet Take 5 mg (1 tablet) today and tomorrow and check INR on Tuesday Qty: 30 tablet, Refills: 0      CONTINUE these medications which have NOT CHANGED   Details   amiodarone (PACERONE) 200 MG tablet Take 1 tablet (200 mg total) by mouth daily. Qty: 30 tablet, Refills: 5    aspirin EC 325 MG EC tablet Take 1 tablet (325 mg total) by mouth daily. Qty: 30 tablet, Refills: 11    docusate sodium (COLACE) 100 MG capsule Take 2 capsules (200 mg total) by mouth daily. Qty: 10 capsule, Refills: 0    insulin aspart (NOVOLOG) 100 UNIT/ML injection Inject 3 Units into the skin 3 (three) times daily with meals. Qty: 1 vial, Refills: 0   Associated Diagnoses: Type 2 diabetes mellitus with complication, with long-term current use of insulin (HCC)    insulin glargine (LANTUS) 100 UNIT/ML injection Inject 0.05 mLs (5 Units total) into the skin daily. Qty: 10 mL, Refills: 11   Associated Diagnoses: Type 2 diabetes mellitus with complication, with long-term  current use of insulin (HCC)    nitroGLYCERIN (NITROSTAT) 0.4 MG SL tablet Place 1 tablet (0.4 mg total) under the tongue every 5 (five) minutes as needed for chest pain. Qty: 25 tablet, Refills: 3    ondansetron (ZOFRAN ODT) 4 MG disintegrating tablet Take 1 tablet (4 mg total) by mouth every 6 (six) hours as needed for nausea or vomiting. Qty: 40 tablet, Refills: 1    pantoprazole (PROTONIX) 40 MG tablet Take 40 mg by mouth daily.    sildenafil (REVATIO) 20 MG tablet Take 1 tablet (20 mg total) by mouth 3 (three) times daily. Qty: 90 tablet, Refills: 6         Outstanding Labs/Studies   INR check Tuesday  Duration of Discharge Encounter   Greater than 30 minutes including physician time.  Signed, Laveyah Oriol PA-C 07/16/2017, 9:21 AM

## 2017-07-16 NOTE — Progress Notes (Addendum)
[facility] for Warfarin  Indication: LVAD and history of DVT  Allergies  Allergen Reactions  . Metformin And Related Diarrhea    Patient Measurements: Height: 5' 8"  (172.7 cm) Weight: 173 lb 4.5 oz (78.6 kg) IBW/kg (Calculated) : 68.4  Vital Signs: Temp: 97.4 F (36.3 C) (11/11 0436) Temp Source: Oral (11/11 0436) BP: 96/74 (11/11 0436) Pulse Rate: 93 (11/11 0436)  Labs: Recent Labs    07/14/17 0327 07/15/17 0355 07/16/17 0254  HGB 10.0* 10.2* 10.4*  HCT 31.5* 31.9* 31.2*  PLT 383 351 340  LABPROT 60.7* 36.6* 22.5*  INR 7.11* 3.72 2.00  CREATININE 7.16* 4.62* 6.88*    Estimated Creatinine Clearance: 12.2 mL/min (A) (by C-G formula based on SCr of 6.88 mg/dL (H)).   Assessment: 51 y/o M here with N/V, pt has LVAD in place and history of DVT, on warfarin PTA, warfarin has been held for supra-therapeutic INR's, INR has trended down quickly to 2 this AM (7.11>3.72>2). Hgb stable.   Goal of Therapy:  INR 2-3 Monitor platelets by anticoagulation protocol: Yes   Plan:  Warfarin 5 mg PO x 1 today prior to discharge. Instructed patient to take 5 mg again tomorrow, and then he will have INR checked at HF clinic on Tuesday. Daily PT/INR Monitor for bleeding  Uvaldo Rising, BCPS  Clinical Pharmacist Pager [phone]  07/16/2017 11:15 AM

## 2017-07-16 NOTE — Progress Notes (Signed)
Discharge instructions given to patient, all questions answered at this time.  Pt. VSS with no s/s of distress noted.  Patient stable for discharge.

## 2017-07-17 ENCOUNTER — Telehealth (HOSPITAL_COMMUNITY): Payer: Self-pay | Admitting: Unknown Physician Specialty

## 2017-07-17 ENCOUNTER — Encounter (HOSPITAL_COMMUNITY): Payer: BLUE CROSS/BLUE SHIELD

## 2017-07-17 ENCOUNTER — Other Ambulatory Visit (HOSPITAL_COMMUNITY): Payer: Self-pay | Admitting: *Deleted

## 2017-07-17 DIAGNOSIS — Z95811 Presence of heart assist device: Secondary | ICD-10-CM

## 2017-07-17 DIAGNOSIS — Z7901 Long term (current) use of anticoagulants: Secondary | ICD-10-CM

## 2017-07-17 NOTE — Telephone Encounter (Signed)
Received a call from nurse at [facility] kidney stating that the pt d/c dry wt is very different from when the pt was last seen at the diaylsis center. Nurse is asking how much equipment including batteries and controller weigh because the pt was not being weighed in the hospital with equipment. Nurse was informed that the equipment weighs 7 lbs. Nurse was also informed that she will have to call the kidney regarding dry wt and the amount of fluid she can take off as we will not interfere with this.   [name] RN, BSN VAD Coordinator 24/7 Pager [phone]

## 2017-07-18 ENCOUNTER — Encounter (HOSPITAL_COMMUNITY): Payer: Self-pay

## 2017-07-18 ENCOUNTER — Ambulatory Visit (HOSPITAL_COMMUNITY): Payer: Self-pay | Admitting: Pharmacist

## 2017-07-18 ENCOUNTER — Ambulatory Visit (HOSPITAL_COMMUNITY)
Admission: RE | Admit: 2017-07-18 | Discharge: 2017-07-18 | Disposition: A | Discharge status: Home / Self Care | Payer: BLUE CROSS/BLUE SHIELD | Source: Ambulatory Visit | Attending: Cardiology | Admitting: Cardiology

## 2017-07-18 DIAGNOSIS — Z7901 Long term (current) use of anticoagulants: Secondary | ICD-10-CM

## 2017-07-18 DIAGNOSIS — Z95811 Presence of heart assist device: Secondary | ICD-10-CM | POA: Diagnosis present

## 2017-07-18 DIAGNOSIS — Z5181 Encounter for therapeutic drug level monitoring: Secondary | ICD-10-CM

## 2017-07-18 DIAGNOSIS — I82499 Acute embolism and thrombosis of other specified deep vein of unspecified lower extremity: Secondary | ICD-10-CM

## 2017-07-18 LAB — LACTATE DEHYDROGENASE: LDH: 305 U/L — AB (ref 98–192)

## 2017-07-18 LAB — PROTIME-INR
INR: 1.91
PROTHROMBIN TIME: 21.7 s — AB (ref 11.4–15.2)

## 2017-07-18 MED ORDER — AMLODIPINE BESYLATE 5 MG PO TABS: ORAL_TABLET | ORAL | 3 refills | Status: DC

## 2017-07-18 MED ORDER — METOCLOPRAMIDE HCL 5 MG PO TABS: 5.0000 mg | ORAL_TABLET | Freq: Two times a day (BID) | ORAL | 3 refills | Status: DC

## 2017-07-18 NOTE — Progress Notes (Signed)
Patient presents for 1 week  follow up nursing visit  in [facility] today. Reports no problems with VAD equipment or concerns with drive line. States he has had no nausea, vomiting, or diarrhea and has been able to take all medications since discharge.  Vital Signs:  Doppler Pressure 110   Automatc BP: 128/91 (104) HR:86   SPO2:99  %  Weight: 183.8 lb w/o eqt Last weight: 173 lb  VAD interrogation & Equipment Management: Speed:4.3 Flow: 5600 Power:4.4 w    PI:3.3  Alarms: no clinical alarms Events: none  Fixed speed 5600 Low speed limit: 5300   Plan: 1. RTC next week for MD visit. 2. Reduce Reglan to 5 mg BID per Renal/pharmacy 3. Increase Norvasc to 5 mg on tues/thurs/sat/sun  Balinda Quails RN [place] Coordinator   Office: [phone] 24/7 Emergency VAD Pager: [phone]

## 2017-07-18 NOTE — Patient Instructions (Signed)
See youi next week!  Increase your Norvasc (amlodipine) to 5 mg total on tuesdays/thursdays/saturdays/ and Sundays.

## 2017-07-19 ENCOUNTER — Encounter (HOSPITAL_COMMUNITY): Payer: BLUE CROSS/BLUE SHIELD

## 2017-07-21 ENCOUNTER — Encounter (HOSPITAL_COMMUNITY): Payer: BLUE CROSS/BLUE SHIELD

## 2017-07-24 ENCOUNTER — Encounter (HOSPITAL_COMMUNITY): Payer: BLUE CROSS/BLUE SHIELD

## 2017-07-25 ENCOUNTER — Encounter (HOSPITAL_COMMUNITY): Payer: Self-pay

## 2017-07-25 NOTE — Progress Notes (Signed)
[facility] Record request completed on behalf of patient for all records from our office/providers 06/05/2017 per request. Over 300 pages mailed to provided address: [place]: [place]   [phone]  Also faxed back medical record request notifying requester that this was mailed.  Copy of request scanned into patient's electronic medical record.  Renee Pain, RN

## 2017-07-26 ENCOUNTER — Ambulatory Visit (HOSPITAL_COMMUNITY)
Admission: RE | Admit: 2017-07-26 | Discharge: 2017-07-26 | Disposition: A | Discharge status: Home / Self Care | Payer: BLUE CROSS/BLUE SHIELD | Source: Ambulatory Visit | Attending: Internal Medicine | Admitting: Internal Medicine

## 2017-07-26 ENCOUNTER — Other Ambulatory Visit (HOSPITAL_COMMUNITY): Payer: Self-pay | Admitting: Unknown Physician Specialty

## 2017-07-26 ENCOUNTER — Ambulatory Visit (HOSPITAL_COMMUNITY): Payer: Self-pay | Admitting: Pharmacist

## 2017-07-26 ENCOUNTER — Encounter (HOSPITAL_COMMUNITY): Payer: Self-pay

## 2017-07-26 ENCOUNTER — Encounter (HOSPITAL_COMMUNITY): Payer: BLUE CROSS/BLUE SHIELD

## 2017-07-26 DIAGNOSIS — I252 Old myocardial infarction: Secondary | ICD-10-CM | POA: Insufficient documentation

## 2017-07-26 DIAGNOSIS — E785 Hyperlipidemia, unspecified: Secondary | ICD-10-CM | POA: Insufficient documentation

## 2017-07-26 DIAGNOSIS — D6851 Activated protein C resistance: Secondary | ICD-10-CM | POA: Diagnosis not present

## 2017-07-26 DIAGNOSIS — Z79899 Other long term (current) drug therapy: Secondary | ICD-10-CM | POA: Diagnosis not present

## 2017-07-26 DIAGNOSIS — Z95811 Presence of heart assist device: Secondary | ICD-10-CM | POA: Diagnosis not present

## 2017-07-26 DIAGNOSIS — I2510 Atherosclerotic heart disease of native coronary artery without angina pectoris: Secondary | ICD-10-CM | POA: Insufficient documentation

## 2017-07-26 DIAGNOSIS — K3184 Gastroparesis: Secondary | ICD-10-CM | POA: Diagnosis not present

## 2017-07-26 DIAGNOSIS — I132 Hypertensive heart and chronic kidney disease with heart failure and with stage 5 chronic kidney disease, or end stage renal disease: Secondary | ICD-10-CM | POA: Diagnosis not present

## 2017-07-26 DIAGNOSIS — I82499 Acute embolism and thrombosis of other specified deep vein of unspecified lower extremity: Secondary | ICD-10-CM

## 2017-07-26 DIAGNOSIS — Z5181 Encounter for therapeutic drug level monitoring: Secondary | ICD-10-CM

## 2017-07-26 DIAGNOSIS — E1143 Type 2 diabetes mellitus with diabetic autonomic (poly)neuropathy: Secondary | ICD-10-CM | POA: Diagnosis not present

## 2017-07-26 DIAGNOSIS — N186 End stage renal disease: Secondary | ICD-10-CM | POA: Diagnosis not present

## 2017-07-26 DIAGNOSIS — R918 Other nonspecific abnormal finding of lung field: Secondary | ICD-10-CM | POA: Diagnosis not present

## 2017-07-26 DIAGNOSIS — Z992 Dependence on renal dialysis: Secondary | ICD-10-CM | POA: Diagnosis not present

## 2017-07-26 DIAGNOSIS — E1122 Type 2 diabetes mellitus with diabetic chronic kidney disease: Secondary | ICD-10-CM | POA: Insufficient documentation

## 2017-07-26 DIAGNOSIS — I5022 Chronic systolic (congestive) heart failure: Secondary | ICD-10-CM | POA: Diagnosis not present

## 2017-07-26 DIAGNOSIS — Z86718 Personal history of other venous thrombosis and embolism: Secondary | ICD-10-CM | POA: Insufficient documentation

## 2017-07-26 DIAGNOSIS — Z7982 Long term (current) use of aspirin: Secondary | ICD-10-CM | POA: Diagnosis not present

## 2017-07-26 DIAGNOSIS — K5090 Crohn's disease, unspecified, without complications: Secondary | ICD-10-CM | POA: Insufficient documentation

## 2017-07-26 DIAGNOSIS — Z794 Long term (current) use of insulin: Secondary | ICD-10-CM | POA: Insufficient documentation

## 2017-07-26 DIAGNOSIS — I255 Ischemic cardiomyopathy: Secondary | ICD-10-CM | POA: Insufficient documentation

## 2017-07-26 DIAGNOSIS — Z7901 Long term (current) use of anticoagulants: Secondary | ICD-10-CM

## 2017-07-26 DIAGNOSIS — K219 Gastro-esophageal reflux disease without esophagitis: Secondary | ICD-10-CM | POA: Insufficient documentation

## 2017-07-26 LAB — LACTATE DEHYDROGENASE: LDH: 213 U/L — AB (ref 98–192)

## 2017-07-26 LAB — BASIC METABOLIC PANEL
Anion gap: 11 (ref 5–15)
BUN: 22 mg/dL — AB (ref 6–20)
CALCIUM: 8.3 mg/dL — AB (ref 8.9–10.3)
CHLORIDE: 96 mmol/L — AB (ref 101–111)
CO2: 24 mmol/L (ref 22–32)
CREATININE: 4.56 mg/dL — AB (ref 0.61–1.24)
GFR, EST AFRICAN AMERICAN: 16 mL/min — AB (ref >60)
GFR, EST NON AFRICAN AMERICAN: 14 mL/min — AB (ref >60)
Glucose, Bld: 229 mg/dL — ABNORMAL HIGH (ref 65–99)
Potassium: 4.5 mmol/L (ref 3.5–5.1)
SODIUM: 131 mmol/L — AB (ref 135–145)

## 2017-07-26 LAB — CBC
HCT: 28 % — ABNORMAL LOW (ref 39.0–52.0)
HEMOGLOBIN: 8.7 g/dL — AB (ref 13.0–17.0)
MCH: 25.1 pg — AB (ref 26.0–34.0)
MCHC: 31.1 g/dL (ref 30.0–36.0)
MCV: 80.7 fL (ref 78.0–100.0)
PLATELETS: 348 10*3/uL (ref 150–400)
RBC: 3.47 MIL/uL — ABNORMAL LOW (ref 4.22–5.81)
RDW: 16.9 % — ABNORMAL HIGH (ref 11.5–15.5)
WBC: 8.9 10*3/uL (ref 4.0–10.5)

## 2017-07-26 LAB — PROTIME-INR
INR: 2.23
PROTHROMBIN TIME: 24.5 s — AB (ref 11.4–15.2)

## 2017-07-26 MED ORDER — SILDENAFIL CITRATE 20 MG PO TABS: 20.0000 mg | ORAL_TABLET | Freq: Three times a day (TID) | ORAL | 6 refills | Status: DC

## 2017-07-26 MED ORDER — ASPIRIN EC 81 MG PO TBEC: 81.0000 mg | DELAYED_RELEASE_TABLET | Freq: Every day | ORAL | 3 refills | Status: DC

## 2017-07-26 NOTE — Patient Instructions (Signed)
Plan: 1.Decrease Aspirin 81 mg daily. 2. Return to clinic in 1 week for labs-CBC, INR and iron studies.  3. Return to clinic in 3 weeks for VAD visit.

## 2017-07-26 NOTE — Progress Notes (Signed)
CSW met briefly with patient and his daughter in the clinic. Patient states he is doing well and denies any concerns. CSW discussed support group and upcoming meeting in December. Patient's daughter provided e-mail for group list and plan to attend in December. CSW continues to follow for supportive needs. Raquel Sarna, [place], [place]

## 2017-07-26 NOTE — Progress Notes (Addendum)
Patient presents for 1 week  follow up nursing visit  in [facility] today. Reports no problems with VAD equipment or concerns with drive line. States he has had no nausea, vomiting, or diarrhea and has been able to take all medications. States that at dialysis the dialysis doc told him to decrease his hydralazine to 12.5 mg TID on non-HD days. Pt states that he did this.   Vital Signs:  Doppler Pressure 94  Automatc BP: 105/73 (87) HR: 93   SPO2:96  %  Weight: 183.2 lb w/o eqt Last weight: 183.8 lb  VAD interrogation & Equipment Management: Speed:4.3 Flow: 5600 Power:4.2 w    PI: 3.3  Alarms: several low voltage alarms Events: none  Fixed speed 5600 Low speed limit: 5300  Annual Equipment Maintenance on UBC/PM was performed on 06/2017.   I reviewed the LVAD parameters from todayand compared the results to the patient's prior recorded data.LVAD interrogation was NEGATIVEfor significant power changes, NEGATIVEfor clinicalalarms and STABLEfor PI events/speed drops. No programming changes were madeand pump is functioning within specified parameters. Pt is performing daily controller and system monitor self tests along with completing weekly and monthly maintenance for LVAD equipment.  LVAD equipment check completed and is in good working order. Back-up equipment present. Charged back up battery and performed self-test on equipment.   Exit Site Care: Drive line is being maintained weekly by daughter [name]. Existing VAD dressing removed and site care performed using sterile technique. Drive line exit site cleaned with Chlora prep applicators x 2, allowed to dry, and Sorbaview dressing with bio patch re-applied. Exit site healed and incorporated, the velour is fully implanted at exit site. No redness, tenderness, drainage, foul odor or rash noted. Drive line anchor re-applied. Pt denies fever or chills. Pt was given 4 weekly dressings in clinic today.    Significant Events  on VAD Support:  -10/2016>> poss drive line infection, CT ABD neg, ID consult-doxy -11/09/16>> admit for poss drive line infection, IV abx -03/24/17>> doxy for poss drive line infection -05/04/17>> drive line debridement with wound-vac -06/2017>> drive line +proteus, IV abx, Bactrim x14 days  Device:none  BP &Labs:  MAP 87- Doppler is reflecting MAP  Hgb 8.7- No S/S of bleeding. Specifically denies melena/BRBPR or nosebleeds.  LDH   213 - baseline of 245-350.  No power spikes or increase in flow.  Plan: 1.Decrease Aspirin 81 mg daily. 2. Return to clinic in 1 week for labs-CBC, INR and iron studies.  3. Return to clinic in 3 weeks for VAD visit.  4. Refer pt to cardiac rehab.  5. Follow up with pts PCP to try to move up Dec. 15th appt.  [name] RN VAD Coordinator   Office: [phone] 24/7 Emergency VAD Pager: [phone]  Cardiology: Dr. Aundra Dubin  HPI: 51 yo with history of CAD s/p anterior MI in 2010 and NSTEMI in 3/18 with DES to pRCA and mLAD and ischemic cardiomyopathy now s/p Heartmate 3 LVAD and ESRD presents for followup of CHF/LVAD.  Patient developed low output heart failure.  He was not deemed to be a good transplant candidate.  He was admitted in 10/18 and Heartmate 3 LVAD was placed.  He had baseline CKD stage 3 likely from diabetic nephropathy and CHF.  He developed peri-op AKI and ended up on dialysis.  No renal recovery to date.  St Jude ICD has been turned back on and ramp echo was done prior to discharge in 10/18, speed increased to 5600 rpm.   Follow  up for Heart Failure/LVAD:  He was admitted in 11/18 with nausea/vomiting and abdominal pain.  This appears to have been due to diabetic gastroparesis (extensive GI evaluation).  He improved considerably with scheduled Reglan and erythromycin.  Currently feeling good.  No abdominal pain, nausea, vomiting.  Eating normally.  MAP 94 today, renal decreased hydralazine to 12.5 mg tid on non-HD days.  He is also on  amlodipine 5 on non-HD days.  No dyspnea walking on flat ground, no orthopnea.  No BRBPR/melena.  However, hemoglobin is lower today.    Denies LVAD alarms.  Denies driveline trauma, erythema or drainage.  Denies ICD shocks.   Reports taking Coumadin as prescribed and adherence to anticoagulation based dietary restrictions.  Denies bright red blood per rectum or melena, no dark urine or hematuria.    Labs (11/18): hgb 10.4 => 8.7, LDH 213  PMH: 1. Chronic systolic CHF: Ischemic cardiomyopathy. St Jude ICD.  - Echo (11/17) with EF 25-30%, moderate diastolic dysfunction, mild MR, mildly dilated RV with moderately decreased systolic function, peak RV-RA gradient 48 mmHg.  - Echo (3/18) with EF 25-30%, wall motion abnormalities, normal RV size and systolic function.  - Admission 3/18 with cardiogenic shock and AKI, required milrinone gtt.  - Echo (8/18) with EF 20-25%, mild LVH, moderate LV dilation, mild MR.  - CPX (8/18) with peak VO2 12.4, VE/VCO2 slope 35, RER 1.05 => severe HR limitation.  - Heartmate 3 LVAD 10/18.  2. CAD: Anterior MI 2005, had bifurcation stenting LAD/diagonal. Repeat cath 2010 with patent stents.  - NSTEMI 3/18: LHC with 99% ulcerated lesion proximal RCA, 95% mid LAD => PCI with DES to proximal RCA and mid LAD.  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  - Lower extremity venous dopplers (8/18): chronic DVT - V/Q scan (8/18): Normal, no PE.  4. Crohns disease: History of colectomy.  5. GERD 6. Diabetic gastroparesis: Admission 11/18 with abdominal pain, nausea/vomiting.  7. HTN 8. Hyperlipidemia 9. Type II diabetes  10. ESRD: Post-op LVAD surgery.  11. Carotid dopplers (8/18): mild bilateral stenosis.  12. PFTs (8/18): Mild restriction (no IDL on CT chest).  13. Lung nodules: CT chest 8/18 with small bilateral nodules.  14. PAD: ABIs with moderate bilateral disease in 8/18.   Current Outpatient Medications  Medication Sig Dispense Refill  . amiodarone (PACERONE) 200 MG  tablet Take 1 tablet (200 mg total) by mouth daily. 30 tablet 5  . amLODipine (NORVASC) 5 MG tablet Take 1 tablet (47m) on Tuesday, Thursday, Saturday and Sunday (non HD days) by mouth 90 tablet 3  . atorvastatin (LIPITOR) 40 MG tablet Take 1 tablet (40 mg total) daily at 6 PM by mouth. 30 tablet 6  . erythromycin (E-MYCIN) 250 MG tablet Take 1 tablet (250 mg total) 3 (three) times daily by mouth. 9 tablet 0  . hydrALAZINE (APRESOLINE) 25 MG tablet Take 37.5 mg three times a day on non hemodialysis days and in the evening on dialysis days. (Patient taking differently: 12.5 mg. Take 12.5 mg three times a day on non hemodialysis days and in the evening on dialysis days.) 95 tablet 3  . insulin aspart (NOVOLOG) 100 UNIT/ML injection Inject 3 Units into the skin 3 (three) times daily with meals. 1 vial 0  . insulin glargine (LANTUS) 100 UNIT/ML injection Inject 0.05 mLs (5 Units total) into the skin daily. 10 mL 11  . metoCLOPramide (REGLAN) 5 MG tablet Take 1 tablet (5 mg total) 2 (two) times daily before lunch and  supper by mouth. 90 tablet 3  . nitroGLYCERIN (NITROSTAT) 0.4 MG SL tablet Place 1 tablet (0.4 mg total) under the tongue every 5 (five) minutes as needed for chest pain. 25 tablet 3  . ondansetron (ZOFRAN ODT) 4 MG disintegrating tablet Take 1 tablet (4 mg total) by mouth every 6 (six) hours as needed for nausea or vomiting. 40 tablet 1  . pantoprazole (PROTONIX) 40 MG tablet Take 40 mg by mouth daily.    . sildenafil (REVATIO) 20 MG tablet Take 1 tablet (20 mg total) by mouth 3 (three) times daily. 90 tablet 6  . warfarin (COUMADIN) 5 MG tablet Take 5 mg (1 tablet) daily except 7.5 mg (1 and 1/2 tablets) on Tues/Thurs.    [name] aspirin EC 81 MG tablet Take 1 tablet (81 mg total) by mouth daily. 90 tablet 3  . docusate sodium (COLACE) 100 MG capsule Take 2 capsules (200 mg total) by mouth daily. (Patient not taking: Reported on 07/26/2017) 10 capsule 0   No current facility-administered  medications for this encounter.     Metformin and related  REVIEW OF SYSTEMS: All systems negative except as listed in HPI, PMH and Problem list.   LVAD INTERROGATION: See LVAD nurse's note above.   I reviewed the LVAD parameters from today, and compared the results to the patient's prior recorded data.  No programming changes were made.  The LVAD is functioning within specified parameters.  The patient performs LVAD self-test daily.  LVAD interrogation was negative for any significant power changes, alarms or PI events/speed drops.  LVAD equipment check completed and is in good working order.  Back-up equipment present.   LVAD education done on emergency procedures and precautions and reviewed exit site care.    Vitals:   07/26/17 1111 07/26/17 1112  BP: 105/73 (!) 94/0  Pulse: 93   SpO2: 96%   Weight: 183 lb 3.2 oz (83.1 kg)   Height: 5' 8"  (1.727 m)     Physical Exam: General: NAD Neck: No JVD, no thyromegaly or thyroid nodule.  Lungs: Clear to auscultation bilaterally with normal respiratory effort. CV: Nondisplaced PMI.  Heart regular S1/S2, no S3/S4, no murmur.  No peripheral edema.  No carotid bruit.  Normal pedal pulses.  Abdomen: Soft, nontender, no hepatosplenomegaly, no distention.  Skin: Intact without lesions or rashes.  Neurologic: Alert and oriented x 3.  Psych: Normal affect. Extremities: No clubbing or cyanosis.  HEENT: Normal.   ASSESSMENT AND PLAN: 1. Chronic systolic CHF: Ischemic cardiomyopathy.  St Jude ICD.  NSTEMI in March 2018 with DES to LAD and RCA, complicated by cardiogenic shock, low output requiring milrinone.  Echo 8/18 with EF 20-25%, moderate MR.  PX (8/18) with severe functional impairment due to HF.  He is now s/p HM3 LVAD placement. LVAD parameters reviewed today and stable.  No complaints. MAP is mildly elevated but renal recently decreased his hydralazine.  Of note, hemoglobin is lower but no BRBPR/melena.  - With fall in hemoglobin, will  decrease ASA to 81 daily.  Check CBC again next week, will also check Fe studies.  Continue warfarin INR 2-3 for now with no overt GI bleeding.  - He can begin cardiac rehab.  2. ESRD: Developed peri-operatively after HM3 placement.  He is tolerating HD without problems currently.  3. CAD: NSTEMI in 3/18. LHC with 99% ulcerated lesion proximal RCA with left to right collaterals, 95% mid LAD stenosis after mid LAD stent. s/p PCI to RCA and LAD on 11/25/16.  No exertional chest pain.  - Continue statin and ASA. 4. h/o DVTs: Factor V Leiden heterozygote.  On warfarin.  5. Diabetic gastroparesis: Quiescent.  6. DM: Per internal medicine.  7. HTN: MAP mildly elevated.  Renal recently decreased hydralazine.  For now, continue amlodipine 5 mg and hydralazine 12.5 tid on non-HD days.  8. PAD: Moderate bilateral disease on ABIs in 8/18.  Denies claudication currently.   [name] 07/28/2017

## 2017-07-28 NOTE — Addendum Note (Signed)
Encounter addended by: Larey Dresser, MD on: 07/28/2017 11:13 AM  Actions taken: LOS modified

## 2017-07-31 ENCOUNTER — Encounter (HOSPITAL_COMMUNITY): Payer: BLUE CROSS/BLUE SHIELD

## 2017-07-31 ENCOUNTER — Inpatient Hospital Stay (HOSPITAL_COMMUNITY)
Admission: EM | Admit: 2017-07-31 | Discharge: 2017-08-04 | DRG: 073 | Disposition: A | Discharge status: Home Health | Payer: BLUE CROSS/BLUE SHIELD | Attending: Internal Medicine | Admitting: Internal Medicine

## 2017-07-31 ENCOUNTER — Emergency Department (HOSPITAL_COMMUNITY): Payer: BLUE CROSS/BLUE SHIELD

## 2017-07-31 ENCOUNTER — Encounter (HOSPITAL_COMMUNITY): Payer: Self-pay | Admitting: Emergency Medicine

## 2017-07-31 ENCOUNTER — Telehealth (HOSPITAL_COMMUNITY): Payer: Self-pay | Admitting: *Deleted

## 2017-07-31 ENCOUNTER — Other Ambulatory Visit: Payer: Self-pay

## 2017-07-31 DIAGNOSIS — E1143 Type 2 diabetes mellitus with diabetic autonomic (poly)neuropathy: Secondary | ICD-10-CM | POA: Diagnosis present

## 2017-07-31 DIAGNOSIS — Z8371 Family history of colonic polyps: Secondary | ICD-10-CM | POA: Diagnosis not present

## 2017-07-31 DIAGNOSIS — Z7982 Long term (current) use of aspirin: Secondary | ICD-10-CM | POA: Diagnosis not present

## 2017-07-31 DIAGNOSIS — E1122 Type 2 diabetes mellitus with diabetic chronic kidney disease: Secondary | ICD-10-CM | POA: Diagnosis present

## 2017-07-31 DIAGNOSIS — Z95811 Presence of heart assist device: Secondary | ICD-10-CM

## 2017-07-31 DIAGNOSIS — R112 Nausea with vomiting, unspecified: Secondary | ICD-10-CM | POA: Diagnosis not present

## 2017-07-31 DIAGNOSIS — K5090 Crohn's disease, unspecified, without complications: Secondary | ICD-10-CM | POA: Diagnosis present

## 2017-07-31 DIAGNOSIS — Z9049 Acquired absence of other specified parts of digestive tract: Secondary | ICD-10-CM | POA: Diagnosis not present

## 2017-07-31 DIAGNOSIS — I5022 Chronic systolic (congestive) heart failure: Secondary | ICD-10-CM | POA: Diagnosis present

## 2017-07-31 DIAGNOSIS — I252 Old myocardial infarction: Secondary | ICD-10-CM | POA: Diagnosis not present

## 2017-07-31 DIAGNOSIS — I132 Hypertensive heart and chronic kidney disease with heart failure and with stage 5 chronic kidney disease, or end stage renal disease: Secondary | ICD-10-CM | POA: Diagnosis present

## 2017-07-31 DIAGNOSIS — Z794 Long term (current) use of insulin: Secondary | ICD-10-CM | POA: Diagnosis not present

## 2017-07-31 DIAGNOSIS — G47419 Narcolepsy without cataplexy: Secondary | ICD-10-CM | POA: Diagnosis present

## 2017-07-31 DIAGNOSIS — E118 Type 2 diabetes mellitus with unspecified complications: Secondary | ICD-10-CM

## 2017-07-31 DIAGNOSIS — E876 Hypokalemia: Secondary | ICD-10-CM | POA: Diagnosis not present

## 2017-07-31 DIAGNOSIS — Z7901 Long term (current) use of anticoagulants: Secondary | ICD-10-CM | POA: Diagnosis not present

## 2017-07-31 DIAGNOSIS — D631 Anemia in chronic kidney disease: Secondary | ICD-10-CM | POA: Diagnosis present

## 2017-07-31 DIAGNOSIS — Z955 Presence of coronary angioplasty implant and graft: Secondary | ICD-10-CM

## 2017-07-31 DIAGNOSIS — Z6827 Body mass index (BMI) 27.0-27.9, adult: Secondary | ICD-10-CM

## 2017-07-31 DIAGNOSIS — I2510 Atherosclerotic heart disease of native coronary artery without angina pectoris: Secondary | ICD-10-CM | POA: Diagnosis present

## 2017-07-31 DIAGNOSIS — I255 Ischemic cardiomyopathy: Secondary | ICD-10-CM | POA: Diagnosis present

## 2017-07-31 DIAGNOSIS — Z833 Family history of diabetes mellitus: Secondary | ICD-10-CM

## 2017-07-31 DIAGNOSIS — R630 Anorexia: Secondary | ICD-10-CM | POA: Diagnosis not present

## 2017-07-31 DIAGNOSIS — Z888 Allergy status to other drugs, medicaments and biological substances status: Secondary | ICD-10-CM

## 2017-07-31 DIAGNOSIS — Z8249 Family history of ischemic heart disease and other diseases of the circulatory system: Secondary | ICD-10-CM | POA: Diagnosis not present

## 2017-07-31 DIAGNOSIS — E46 Unspecified protein-calorie malnutrition: Secondary | ICD-10-CM | POA: Diagnosis present

## 2017-07-31 DIAGNOSIS — N186 End stage renal disease: Secondary | ICD-10-CM | POA: Diagnosis present

## 2017-07-31 DIAGNOSIS — R791 Abnormal coagulation profile: Secondary | ICD-10-CM | POA: Diagnosis not present

## 2017-07-31 DIAGNOSIS — K3184 Gastroparesis: Secondary | ICD-10-CM | POA: Diagnosis present

## 2017-07-31 DIAGNOSIS — D649 Anemia, unspecified: Secondary | ICD-10-CM

## 2017-07-31 DIAGNOSIS — Z95810 Presence of automatic (implantable) cardiac defibrillator: Secondary | ICD-10-CM

## 2017-07-31 DIAGNOSIS — D6851 Activated protein C resistance: Secondary | ICD-10-CM | POA: Diagnosis present

## 2017-07-31 DIAGNOSIS — Z992 Dependence on renal dialysis: Secondary | ICD-10-CM

## 2017-07-31 DIAGNOSIS — R945 Abnormal results of liver function studies: Secondary | ICD-10-CM

## 2017-07-31 DIAGNOSIS — K219 Gastro-esophageal reflux disease without esophagitis: Secondary | ICD-10-CM | POA: Diagnosis present

## 2017-07-31 DIAGNOSIS — Z86718 Personal history of other venous thrombosis and embolism: Secondary | ICD-10-CM | POA: Diagnosis not present

## 2017-07-31 DIAGNOSIS — Z79899 Other long term (current) drug therapy: Secondary | ICD-10-CM

## 2017-07-31 LAB — BASIC METABOLIC PANEL
ANION GAP: 16 — AB (ref 5–15)
BUN: 48 mg/dL — AB (ref 6–20)
CALCIUM: 8.6 mg/dL — AB (ref 8.9–10.3)
CO2: 18 mmol/L — ABNORMAL LOW (ref 22–32)
Chloride: 102 mmol/L (ref 101–111)
Creatinine, Ser: 7.33 mg/dL — ABNORMAL HIGH (ref 0.61–1.24)
GFR calc Af Amer: 9 mL/min — ABNORMAL LOW (ref >60)
GFR, EST NON AFRICAN AMERICAN: 8 mL/min — AB (ref >60)
GLUCOSE: 152 mg/dL — AB (ref 65–99)
Potassium: 4.6 mmol/L (ref 3.5–5.1)
Sodium: 136 mmol/L (ref 135–145)

## 2017-07-31 LAB — GLUCOSE, CAPILLARY
Glucose-Capillary: 199 mg/dL — ABNORMAL HIGH (ref 65–99)
Glucose-Capillary: 231 mg/dL — ABNORMAL HIGH (ref 65–99)
Glucose-Capillary: 170 mg/dL — ABNORMAL HIGH (ref 65–99)
Glucose-Capillary: 152 mg/dL — ABNORMAL HIGH (ref 65–99)

## 2017-07-31 LAB — CBC
HCT: 26.3 % — ABNORMAL LOW (ref 39.0–52.0)
Hemoglobin: 8.4 g/dL — ABNORMAL LOW (ref 13.0–17.0)
MCH: 25.5 pg — ABNORMAL LOW (ref 26.0–34.0)
MCHC: 31.9 g/dL (ref 30.0–36.0)
MCV: 79.7 fL (ref 78.0–100.0)
PLATELETS: 278 10*3/uL (ref 150–400)
RBC: 3.3 MIL/uL — ABNORMAL LOW (ref 4.22–5.81)
RDW: 17.4 % — AB (ref 11.5–15.5)
WBC: 11 10*3/uL — AB (ref 4.0–10.5)

## 2017-07-31 LAB — CBC WITH DIFFERENTIAL/PLATELET
BASOS ABS: 0 10*3/uL (ref 0.0–0.1)
Basophils Relative: 0 %
EOS PCT: 1 %
Eosinophils Absolute: 0.1 10*3/uL (ref 0.0–0.7)
HEMATOCRIT: 29.5 % — AB (ref 39.0–52.0)
HEMOGLOBIN: 9.4 g/dL — AB (ref 13.0–17.0)
LYMPHS PCT: 9 %
Lymphs Abs: 0.8 10*3/uL (ref 0.7–4.0)
MCH: 25.2 pg — ABNORMAL LOW (ref 26.0–34.0)
MCHC: 31.9 g/dL (ref 30.0–36.0)
MCV: 79.1 fL (ref 78.0–100.0)
Monocytes Absolute: 0.6 10*3/uL (ref 0.1–1.0)
Monocytes Relative: 6 %
NEUTROS ABS: 7.3 10*3/uL (ref 1.7–7.7)
NEUTROS PCT: 84 %
PLATELETS: 288 10*3/uL (ref 150–400)
RBC: 3.73 MIL/uL — AB (ref 4.22–5.81)
RDW: 17.3 % — ABNORMAL HIGH (ref 11.5–15.5)
WBC: 8.7 10*3/uL (ref 4.0–10.5)

## 2017-07-31 LAB — COMPREHENSIVE METABOLIC PANEL
ALK PHOS: 130 U/L — AB (ref 38–126)
ALT: 15 U/L — AB (ref 17–63)
AST: 16 U/L (ref 15–41)
Albumin: 2.8 g/dL — ABNORMAL LOW (ref 3.5–5.0)
Anion gap: 12 (ref 5–15)
BUN: 47 mg/dL — AB (ref 6–20)
CALCIUM: 8.9 mg/dL (ref 8.9–10.3)
CHLORIDE: 100 mmol/L — AB (ref 101–111)
CO2: 24 mmol/L (ref 22–32)
CREATININE: 7.39 mg/dL — AB (ref 0.61–1.24)
GFR calc Af Amer: 9 mL/min — ABNORMAL LOW (ref >60)
GFR, EST NON AFRICAN AMERICAN: 8 mL/min — AB (ref >60)
Glucose, Bld: 116 mg/dL — ABNORMAL HIGH (ref 65–99)
Potassium: 4.2 mmol/L (ref 3.5–5.1)
Sodium: 136 mmol/L (ref 135–145)
Total Bilirubin: 0.8 mg/dL (ref 0.3–1.2)
Total Protein: 7 g/dL (ref 6.5–8.1)

## 2017-07-31 LAB — PROTIME-INR
INR: 1.49
PROTHROMBIN TIME: 17.9 s — AB (ref 11.4–15.2)

## 2017-07-31 LAB — LACTATE DEHYDROGENASE: LDH: 216 U/L — ABNORMAL HIGH (ref 98–192)

## 2017-07-31 LAB — MRSA PCR SCREENING: MRSA BY PCR: NEGATIVE

## 2017-07-31 LAB — HEPARIN LEVEL (UNFRACTIONATED): Heparin Unfractionated: 0.31 IU/mL (ref 0.30–0.70)

## 2017-07-31 MED ORDER — METOCLOPRAMIDE HCL 5 MG/ML IJ SOLN
10.0000 mg | Freq: Three times a day (TID) | INTRAMUSCULAR | Status: DC
  Administered 2017-07-31 – 2017-08-01 (×3): 10 mg via INTRAVENOUS
  Filled 2017-07-31 (×3): qty 2

## 2017-07-31 MED ORDER — ASPIRIN EC 81 MG PO TBEC
81.0000 mg | DELAYED_RELEASE_TABLET | Freq: Every day | ORAL | Status: DC
  Administered 2017-08-01 – 2017-08-04 (×4): 81 mg via ORAL
  Filled 2017-07-31 (×5): qty 1

## 2017-07-31 MED ORDER — SODIUM CHLORIDE 0.9% FLUSH
3.0000 mL | Freq: Two times a day (BID) | INTRAVENOUS | Status: DC
  Administered 2017-08-01 – 2017-08-03 (×5): 3 mL via INTRAVENOUS

## 2017-07-31 MED ORDER — METOCLOPRAMIDE HCL 5 MG/ML IJ SOLN: 5.0000 mg | Freq: Two times a day (BID) | INTRAMUSCULAR | Status: DC

## 2017-07-31 MED ORDER — METOCLOPRAMIDE HCL 5 MG/ML IJ SOLN
10.0000 mg | Freq: Three times a day (TID) | INTRAMUSCULAR | Status: DC
  Administered 2017-07-31: 10 mg via INTRAVENOUS
  Filled 2017-07-31: qty 2

## 2017-07-31 MED ORDER — ERYTHROMYCIN BASE 250 MG PO TABS: 250.0000 mg | ORAL_TABLET | Freq: Three times a day (TID) | ORAL | Status: DC

## 2017-07-31 MED ORDER — ERYTHROMYCIN BASE 250 MG PO TABS
250.0000 mg | ORAL_TABLET | Freq: Three times a day (TID) | ORAL | Status: DC
  Filled 2017-07-31 (×3): qty 1

## 2017-07-31 MED ORDER — ERYTHROMYCIN BASE 250 MG PO TABS
250.0000 mg | ORAL_TABLET | Freq: Three times a day (TID) | ORAL | Status: DC
  Administered 2017-08-01 – 2017-08-04 (×9): 250 mg via ORAL
  Filled 2017-07-31 (×12): qty 1

## 2017-07-31 MED ORDER — PANTOPRAZOLE SODIUM 40 MG IV SOLR
40.0000 mg | INTRAVENOUS | Status: DC
  Administered 2017-07-31 – 2017-08-01 (×2): 40 mg via INTRAVENOUS
  Filled 2017-07-31 (×4): qty 40

## 2017-07-31 MED ORDER — PROMETHAZINE HCL 25 MG/ML IJ SOLN
12.5000 mg | Freq: Once | INTRAMUSCULAR | Status: AC
  Administered 2017-07-31: 12.5 mg via INTRAVENOUS
  Filled 2017-07-31: qty 1

## 2017-07-31 MED ORDER — SODIUM CHLORIDE 0.9 % IV SOLN: 250.0000 mL | INTRAVENOUS | Status: DC | PRN

## 2017-07-31 MED ORDER — ONDANSETRON HCL 4 MG/2ML IJ SOLN
4.0000 mg | Freq: Four times a day (QID) | INTRAMUSCULAR | Status: DC | PRN
  Administered 2017-08-01 – 2017-08-03 (×4): 4 mg via INTRAVENOUS
  Filled 2017-07-31 (×4): qty 2

## 2017-07-31 MED ORDER — HEPARIN (PORCINE) IN NACL 100-0.45 UNIT/ML-% IJ SOLN
1200.0000 [IU]/h | INTRAMUSCULAR | Status: DC
  Administered 2017-07-31 – 2017-08-01 (×2): 1200 [IU]/h via INTRAVENOUS
  Filled 2017-07-31 (×3): qty 250

## 2017-07-31 MED ORDER — AMIODARONE HCL 200 MG PO TABS
200.0000 mg | ORAL_TABLET | Freq: Every day | ORAL | Status: DC
  Administered 2017-08-01 – 2017-08-04 (×4): 200 mg via ORAL
  Filled 2017-07-31 (×5): qty 1

## 2017-07-31 MED ORDER — SODIUM CHLORIDE 0.9 % IV BOLUS (SEPSIS)
1000.0000 mL | Freq: Once | INTRAVENOUS | Status: AC
  Administered 2017-07-31: 1000 mL via INTRAVENOUS

## 2017-07-31 MED ORDER — WARFARIN SODIUM 7.5 MG PO TABS
7.5000 mg | ORAL_TABLET | Freq: Once | ORAL | Status: AC
  Filled 2017-07-31: qty 1

## 2017-07-31 MED ORDER — WARFARIN - PHARMACIST DOSING INPATIENT
Freq: Every day | Status: DC
  Administered 2017-07-31: 18:00:00

## 2017-07-31 MED ORDER — SODIUM CHLORIDE 0.9% FLUSH: 3.0000 mL | INTRAVENOUS | Status: DC | PRN

## 2017-07-31 MED ORDER — PROMETHAZINE HCL 25 MG/ML IJ SOLN
12.5000 mg | Freq: Four times a day (QID) | INTRAMUSCULAR | Status: DC
  Administered 2017-07-31 – 2017-08-02 (×7): 12.5 mg via INTRAVENOUS
  Filled 2017-07-31 (×7): qty 1

## 2017-07-31 MED ORDER — INSULIN ASPART 100 UNIT/ML ~~LOC~~ SOLN
0.0000 [IU] | Freq: Three times a day (TID) | SUBCUTANEOUS | Status: DC
  Administered 2017-07-31 (×2): 2 [IU] via SUBCUTANEOUS
  Administered 2017-07-31: 3 [IU] via SUBCUTANEOUS
  Administered 2017-08-03 (×2): 1 [IU] via SUBCUTANEOUS

## 2017-07-31 MED ORDER — ONDANSETRON HCL 4 MG/2ML IJ SOLN
4.0000 mg | Freq: Once | INTRAMUSCULAR | Status: AC
  Administered 2017-07-31: 4 mg via INTRAVENOUS
  Filled 2017-07-31: qty 2

## 2017-07-31 MED ORDER — METOCLOPRAMIDE HCL 5 MG/ML IJ SOLN
5.0000 mg | Freq: Once | INTRAMUSCULAR | Status: AC
  Administered 2017-07-31: 5 mg via INTRAVENOUS
  Filled 2017-07-31: qty 2

## 2017-07-31 MED ORDER — PROMETHAZINE HCL 25 MG/ML IJ SOLN
12.5000 mg | Freq: Four times a day (QID) | INTRAMUSCULAR | Status: DC | PRN
  Administered 2017-07-31: 12.5 mg via INTRAVENOUS
  Filled 2017-07-31: qty 1

## 2017-07-31 MED ORDER — ACETAMINOPHEN 325 MG PO TABS: 650.0000 mg | ORAL_TABLET | ORAL | Status: DC | PRN

## 2017-07-31 MED ORDER — HYDRALAZINE HCL 20 MG/ML IJ SOLN
10.0000 mg | Freq: Four times a day (QID) | INTRAMUSCULAR | Status: DC | PRN
  Administered 2017-07-31 – 2017-08-01 (×2): 10 mg via INTRAVENOUS
  Filled 2017-07-31 (×2): qty 1

## 2017-07-31 NOTE — Progress Notes (Addendum)
LVAD Coordinator Rounding Note:  Admitted 07/31/17 for intractable nausea/vomiting due to gastroparesis.  HM3LVAD implanted on 10/8/2018by Dr Elpidio [name] Destination Therapy criteria due to renal insufficiency.  Vital signs: Temp: 98.1 HR:  87 Doppler: 142 - correlating as modified systolic Auto cuff: 563/875 (115) O2 Sat: 95 % on RA  Wt: 181 lbs   LVAD interrogation reveals:  Speed: 5600 Flow:  3.6 Power: 4.3w PI: 3.9 Alarms: none Events: rare Hematocrit: 26 Fixed speed: 5600 Low speed limit: 5300  Back up black bag with controller, batteries, and extra clips at bedside.    Drive Line: Daily. Advanced to weekly dressings. Due Wednesday 08/02/17.  Labs:  LDH trend: 216  INR trend: 1.49  Hgb trend: 9.4>  Anticoagulation Plan: -INR Goal: 2-3 -ASA Dose: 81 mg  Gtts: Heparin 1200 units/hr  Device: -St Jude dual ICD -Therapies: ON at 200 bpm  Renal:  Creat trend: 7.39 HD - MWF  Plan/Recommendations:  1.  Call VAD pager for any VAD equipment issues.   Zada Girt RN, VAD Coordinator 24/7 pager [phone]

## 2017-07-31 NOTE — H&P (Signed)
Advanced Heart Failure VAD History and Physical Note   Reason for Admission: Intractable nausea and vomiting due to DM2 gastroparesis   HPI:    [name] is a 51 yo male with DM2, CAD s/p anterior MI with severe systolic HF (EF 97%) s/p SJ ICD & Heartmate 3 LVAD and ESRD being admitted for recurrent intractable N/V due to gastroparesis.  He has issues with recurrent driveline infections.   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 Was recently seen in clinic on 11/21 and was doing well.   Over past few days again unable to hold any food or liquids down so presented to ER. Denies fevers or chills. No dark stools. No ab pain except from retching. Denies orthopnea or PND. No problems with driveline. No bleeding, melena or neuro symptoms. No VAD alarms. Unable to hold meds down  In ER continues with constant n/v. Slight improvement with zofran, reglan and phenergan. Give 1L NS. INR 1.49. WBC 8.7  LDH 216. No alarms on VAD or signs of driveline infection.   KUB (viewed personally) non-obstructive bowel gas pattern    LVAD INTERROGATION:  HeartMate II LVAD:  Flow 3.3 liters/min, speed 5600, power 4.3, PI 6.5.     Review of Systems: [y] = yes, [ ]  = no   General: Weight gain [ ] ; Weight loss Blue.Reese ]; Anorexia [ y]; Fatigue Blue.Reese ]; Fever [ ] ; Chills [ ] ; Weakness Blue.Reese ]  Cardiac: Chest pain/pressure [ ] ; Resting SOB [ ] ; Exertional SOB [ ] ; Orthopnea [ ] ; Pedal Edema [ ] ; Palpitations [ ] ; Syncope [ ] ; Presyncope [ ] ; Paroxysmal nocturnal dyspnea[ ]   Pulmonary: Cough [ ] ; Wheezing[ ] ; Hemoptysis[ ] ; Sputum [ ] ; Snoring [ ]   GI: Vomiting[y]; Dysphagia[ ] ; Melena[ ] ; Hematochezia [ ] ; Heartburn[ ] ; Abdominal pain [y]; Constipation [ ] ; Diarrhea [ ] ; BRBPR [ ]   GU: Hematuria[ ] ; Dysuria [ ] ;  Nocturia[ ]   Vascular: Pain in legs with walking [ ] ; Pain in feet with lying flat [ ] ; Non-healing sores [ ] ; Stroke [ ] ; TIA [ ] ; Slurred speech [ ] ;  Neuro: Headaches[ ] ; Vertigo[ ] ; Seizures[ ] ; Paresthesias[ ] ;Blurred vision [ ] ; Diplopia [ ] ; Vision changes [ ]   Ortho/Skin: Arthritis Blue.Reese ]; Joint pain [ y]; Muscle pain [ ] ; Joint swelling [ ] ; Back Pain [ ] ; Rash [ ]   Psych: Depression[y ]; Anxiety[ ]   Heme: Bleeding problems [ ] ; Clotting disorders [ ] ; Anemia Blue.Reese ]  Endocrine: Diabetes Blue.Reese ]; Thyroid dysfunction[ ]     Home Medications Prior to Admission medications   Medication Sig Start Date End Date Taking? Authorizing Provider  amiodarone (PACERONE) 200 MG tablet Take 1 tablet (200 mg total) by mouth daily. 07/03/17  Yes Clegg, Amy D, NP  amLODipine (NORVASC) 5 MG tablet Take 1 tablet (70m) on Tuesday, Thursday, Saturday and Sunday (non HD days) by mouth 07/18/17  Yes MLarey Dresser MD  aspirin EC 81 MG tablet Take 1 tablet (81 mg total) by mouth daily. 07/26/17  Yes MLarey Dresser MD  atorvastatin (LIPITOR) 40 MG tablet Take 1 tablet (40 mg total) daily at 6 PM by mouth. 07/16/17  Yes Bhagat, Bhavinkumar, PA  hydrALAZINE (APRESOLINE) 25 MG tablet Take 37.5 mg three times a day on non hemodialysis days and in the evening on dialysis days. Patient  taking differently: 12.5 mg. Take 12.5 mg three times a day on non hemodialysis days and in the evening on dialysis days. 07/16/17  Yes Bhagat, Bhavinkumar, PA  insulin aspart (NOVOLOG) 100 UNIT/ML injection Inject 3 Units into the skin 3 (three) times daily with meals. 07/03/17  Yes Clegg, Amy D, NP  insulin glargine (LANTUS) 100 UNIT/ML injection Inject 0.05 mLs (5 Units total) into the skin daily. 07/03/17  Yes Clegg, Amy D, NP  metoCLOPramide (REGLAN) 5 MG tablet Take 1 tablet (5 mg total) 2 (two) times daily before lunch and supper by mouth. 07/18/17  Yes Larey Dresser, MD  nitroGLYCERIN (NITROSTAT) 0.4 MG SL tablet Place 1 tablet  (0.4 mg total) under the tongue every 5 (five) minutes as needed for chest pain. 07/13/16  Yes Jerline Pain, MD  ondansetron (ZOFRAN ODT) 4 MG disintegrating tablet Take 1 tablet (4 mg total) by mouth every 6 (six) hours as needed for nausea or vomiting. 01/10/17  Yes Esterwood, Amy S, PA-C  pantoprazole (PROTONIX) 40 MG tablet Take 40 mg by mouth daily.   Yes [provider]  warfarin (COUMADIN) 5 MG tablet Take 5 mg by mouth at bedtime. Take 5 mg (1 tablet) daily except/ 7.5 mg (1 and 1/2 tablets) on Tues/Thurs.   Yes [provider]  docusate sodium (COLACE) 100 MG capsule Take 2 capsules (200 mg total) by mouth daily. Patient not taking: Reported on 07/26/2017 07/03/17   Darrick Grinder D, NP  erythromycin (E-MYCIN) 250 MG tablet Take 1 tablet (250 mg total) 3 (three) times daily by mouth. Patient not taking: Reported on 07/31/2017 07/16/17   Leanor Kail, PA  sildenafil (REVATIO) 20 MG tablet Take 1 tablet (20 mg total) by mouth 3 (three) times daily. Patient not taking: Reported on 07/31/2017 07/26/17   Larey Dresser, MD    Past Medical History: Past Medical History:  Diagnosis Date  . Angina   . ASCVD (arteriosclerotic cardiovascular disease)    , Anterior infarction 2005, LAD diagonal bifurcation intervention 03/2004  . Automatic implantable cardiac defibrillator -St. Jude's       . Benign neoplasm of colon   . CHF (congestive heart failure) ([place])   . Chronic systolic heart failure ([place])   . Coronary artery disease     Widely patent previously placed stents in the left anterior   . Crohn's disease ([place])   . 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 Dyspnea   . Gastroparesis   . GERD (gastroesophageal reflux disease)   . High cholesterol   . Hyperlipidemia   . Hypersomnolent    Previous diagnosis of narcolepsy  . Hypertension, essential   . Ischemic cardiomyopathy    Ejection fraction 15-20% catheterization 2010  . Type II or  unspecified type diabetes mellitus without mention of complication, not stated as uncontrolled   . Unspecified gastritis and gastroduodenitis without mention of hemorrhage     Past Surgical History: Past Surgical History:  Procedure Laterality Date  . APPENDECTOMY    . AV FISTULA PLACEMENT Right 06/26/2017   Procedure: ARTERIOVENOUS (AV) FISTULA CREATION VERSUS GRAFT RIGHT  ARM;  Surgeon: Conrad [place], MD;  Location: [facility];  Service: Vascular;  Laterality: Right;  . CARDIAC DEFIBRILLATOR PLACEMENT  2010   St. Jude ICD  . COLECTOMY  ~ 2003   "for Crohn's" ascending colon.    . CORONARY STENT INTERVENTION N/A 11/25/2016   Procedure: Coronary Stent Intervention;  Surgeon: Leonie Man, MD;  Location: [facility];  Service: Cardiovascular;  Laterality: N/A;  . EP IMPLANTABLE DEVICE N/A 09/14/2016   Procedure: ICD Generator Changeout;  Surgeon: Deboraha Sprang, MD;  Location: [facility];  Service: Cardiovascular;  Laterality: N/A;  . ESOPHAGOGASTRODUODENOSCOPY N/A 07/12/2017   Procedure: ESOPHAGOGASTRODUODENOSCOPY (EGD);  Surgeon: Jerene Bears, MD;  Location: [facility];  Service: Gastroenterology;  Laterality: N/A;  VAD pt so VAD team will need to accompany pt.   [name] FETAL SURGERY FOR CONGENITAL HERNIA     ????? pt has no knowledge of this..    . INGUINAL HERNIA REPAIR    . INSERTION OF DIALYSIS CATHETER Right 06/26/2017   Procedure: INSERTION OF TUNNELED  DIALYSIS CATHETER RIGHT INTERNAL JUGULAR;  Surgeon: Conrad Parker, MD;  Location: [facility];  Service: Vascular;  Laterality: Right;  . INSERTION OF IMPLANTABLE LEFT VENTRICULAR ASSIST DEVICE N/A 06/12/2017   Procedure: INSERTION OF IMPLANTABLE LEFT VENTRICULAR ASSIST [place] 3;  Surgeon: Ivin Poot, MD;  Location: [facility];  Service: Open Heart Surgery;  Laterality: N/A;  HM3 LVAD  CIRC ARREST  NITRIC OXIDE  . IR THORACENTESIS ASP PLEURAL SPACE W/IMG GUIDE  06/28/2017  . MULTIPLE EXTRACTIONS WITH ALVEOLOPLASTY  N/A 06/08/2017   Procedure: MULTIPLE EXTRACTION WITH ALVEOLOPLASTY AND PRE PROSTHETIC SURGERY AS NEEDED;  Surgeon: Lenn Cal, DDS;  Location: [facility];  Service: Oral Surgery;  Laterality: N/A;  . RIGHT HEART CATH N/A 06/07/2017   Procedure: RIGHT HEART CATH;  Surgeon: Larey Dresser, MD;  Location: [facility];  Service: Cardiovascular;  Laterality: N/A;  . RIGHT/LEFT HEART CATH AND CORONARY ANGIOGRAPHY N/A 11/23/2016   Procedure: Right/Left Heart Cath and Coronary Angiography;  Surgeon: Larey Dresser, MD;  Location: [facility];  Service: Cardiovascular;  Laterality: N/A;  . TEE WITHOUT CARDIOVERSION N/A 06/12/2017   Procedure: TRANSESOPHAGEAL ECHOCARDIOGRAM (TEE);  Surgeon: Prescott Gum, Collier [place]ina, MD;  Location: [facility];  Service: Open Heart Surgery;  Laterality: N/A;    Family History: Family History  Problem Relation Age of Onset  . Colon polyps Mother   . Diabetes Mother   . Heart attack Father   . Diabetes Father   . Stroke Paternal Grandfather   . Colitis Sister   . Heart disease Brother   . Colitis Sister   . Colon cancer Neg Hx     Social History: Social History   Socioeconomic History  . Marital status: Married    Spouse name: None  . Number of children: None  . Years of education: None  . Highest education level: None  Social Needs  . Financial resource strain: None  . Food insecurity - worry: None  . Food insecurity - inability: None  . Transportation needs - medical: None  . Transportation needs - non-medical: None  Occupational History  . None  Tobacco Use  . Smoking status: Never Smoker  . Smokeless tobacco: Never Used  Substance and Sexual Activity  . Alcohol use: No  . Drug use: No  . Sexual activity: No  Other Topics Concern  . None  Social History Narrative  . None    Allergies:  Allergies  Allergen Reactions  . Metformin And Related Diarrhea    Objective:    Vital Signs:   Temp:  [98.3 F (36.8 C)] 98.3 F (36.8 C)  (11/26 0018) Pulse Rate:  [85] 85 (11/26 0018) Resp:  [16] 16 (11/26 0018) BP: (131)/(102) 131/102 (11/26 0018) SpO2:  [96 %] 96 % (  11/26 0018) Weight:  [83 kg (183 lb)] 83 kg (183 lb) (11/26 0110)   Filed Weights   07/31/17 0110  Weight: 83 kg (183 lb)    Mean arterial Pressure 91 by Doppler  Physical Exam    General:  Weak appearing. Dry heaving No resp difficulty HEENT: Normal x poor dentition Neck: supple. JVP 7-8 . Carotids 2+ bilat; no bruits. No lymphadenopathy or thyromegaly appreciated. Cor: Mechanical heart sounds with normal HM-3 sounds present.+ perm cath Lungs: Clear Abdomen: soft, nontender, nondistended. No hepatosplenomegaly. No bruits or masses. Good bowel sounds. Driveline: C/D/I; securement device intact and driveline incorporated Extremities: no cyanosis, clubbing, rash, edema. Neuro: alert & orientedx3, cranial nerves grossly intact. moves all 4 extremities w/o difficulty. Affect pleasant   Telemetry   Sinus 80s with PVCs Personally reviewed   EKG   N/A  Labs    Basic Metabolic Panel: Recent Labs  Lab 07/26/17 1115 07/31/17 0035  NA 131* 136  K 4.5 4.2  CL 96* 100*  CO2 24 24  GLUCOSE 229* 116*  BUN 22* 47*  CREATININE 4.56* 7.39*  CALCIUM 8.3* 8.9    Liver Function Tests: Recent Labs  Lab 07/31/17 0035  AST 16  ALT 15*  ALKPHOS 130*  BILITOT 0.8  PROT 7.0  ALBUMIN 2.8*   No results for input(s): LIPASE, AMYLASE in the last 168 hours. No results for input(s): AMMONIA in the last 168 hours.  CBC: Recent Labs  Lab 07/26/17 1115 07/31/17 0035  WBC 8.9 8.7  NEUTROABS  --  7.3  HGB 8.7* 9.4*  HCT 28.0* 29.5*  MCV 80.7 79.1  PLT 348 288    Cardiac Enzymes: No results for input(s): CKTOTAL, CKMB, CKMBINDEX, TROPONINI in the last 168 hours.  BNP: BNP (last 3 results) Recent Labs    06/18/17 2305 06/26/17 0004 07/03/17 0037  BNP 746.0* 1,409.3* 758.9*    ProBNP (last 3 results) No results for input(s): PROBNP in  the last 8760 hours.   CBG: No results for input(s): GLUCAP in the last 168 hours.  Coagulation Studies: Recent Labs    07/31/17 0035  LABPROT 17.9*  INR 1.49    Other results:    Imaging    Dg Abd Acute W/chest  Result Date: 07/31/2017 CLINICAL DATA:  Vomiting EXAM: DG ABDOMEN ACUTE W/ 1V CHEST COMPARISON:  07/13/2017, CT 07/11/2017 cava radiograph 06/18/2017 FINDINGS: Single-view chest demonstrates post sternotomy changes. Left-sided pacing device as before. Right-sided central venous catheter tips overlie the right atrium. LVAD similar in position. Cardiomegaly. No pneumothorax. Partial atelectasis or consolidation at the left base, not significantly changed. Supine and upright views of the abdomen demonstrate no free air beneath the diaphragm. Non obstructed bowel gas pattern with moderate stool. No abnormal calcification. IMPRESSION: 1. Low lung volumes with small left pleural effusion and partial consolidation at the left lung base cough blood not much interval change since 07/13/2017. Cardiomegaly. 2. Nonobstructed bowel-gas pattern Electronically Signed   By: Donavan Foil M.D.   On: 07/31/2017 02:59       Patient Profile:   [name] is a 51 yo male with severe gout, DM2, CAD s/p anterior MI with severe systolic HF (EF 74%) s/p SJ ICD & Heartmate 3 LVAD and ESRD being admitted for recurrent intractable N/V due to gastroparesis.  Assessment/Plan:    1. Intractable N/V due to severe DM2 gastroparesis - Recent extensive GI w/u. Improved previously with erythromycin and reglan - currently unable to hold any meds down. INR 1.4 -  Given 1L NS in ER will not repeat given ESRD - Hold all po meds - Start IV protonix, reglan, erythromycin, zofran and prn phenergan - Liquid diet - Will d/w GI   2. Chronic systolic CHF: Ischemic cardiomyopathy. St Jude ICD. NSTEMI in March 2018 with DES to LAD and RCA, complicated by cardiogenic shock, low output requiring milrinone. Echo  8/18 with EF 20-25%, moderate MR. CPX (8/18) with severe functional impairment due to HF. He is now s/p HM3 LVAD placement. - HF stable Volume status ok. Managed with HD - VAD interrogated personally. Parameters stable. - NYHA II-III - INR low. Unable to hold down coumadin. LDH ok,  - Start heparin per pharmacy   3. ESRD with HD on MWF - will notify Renal in AM  4. CAD: NSTEMI in 3/18. LHC with 99% ulcerated lesion proximal RCA with left to right collaterals, 95% mid LAD stenosis after mid LAD stent. s/p PCI to RCA and LAD on 11/25/16. No exertional chest pain.  - No s/s of ischemia.  - Hold statin for now. Continue ASA.  5. HTN - mildly elevated. Hold po amlodipine and hydralazine. Cover with prn IV hydralazine while NPO  6. h/o DVTs: - Factor V Leiden heterozygote.  On heparin/warfarin.   7. DM:  - cover with SSI while NPO    I reviewed the LVAD parameters from today, and compared the results to the patient's prior recorded data.  No programming changes were made.  The LVAD is functioning within specified parameters.  The patient performs LVAD self-test daily.  LVAD interrogation was negative for any significant power changes, alarms or PI events/speed drops.  LVAD equipment check completed and is in good working order.  Back-up equipment present.   LVAD education done on emergency procedures and precautions and reviewed exit site care.  Length of Stay: 0  [name], MD  4:18 AM  VAD Team Pager [phone] (7am - 7am) +++VAD ISSUES ONLY+++   Advanced Heart Failure Team Pager [phone] (M-F; 7a - 4p)  Please contact [facility] for night-coverage after hours (4p -7a ) and weekends on [email or web address] for all non- LVAD Issues

## 2017-07-31 NOTE — ED Notes (Signed)
Attempted to call report to [facility]. Spoke to Solomon. States she will return call for report.

## 2017-07-31 NOTE — Progress Notes (Signed)
ANTICOAGULATION CONSULT NOTE - Initial Consult  Pharmacy Consult for heparin and warfarin Indication: LVAD, h/o DVT, and factor V leiden  Allergies  Allergen Reactions  . Metformin And Related Diarrhea    Patient Measurements: Height: 5' 8"  (172.7 cm) Weight: 183 lb (83 kg) IBW/kg (Calculated) : 68.4  Vital Signs: Temp: 98.3 F (36.8 C) (11/26 0018) Temp Source: Oral (11/26 0018) BP: 131/102 (11/26 0018) Pulse Rate: 85 (11/26 0018)  Labs: Recent Labs    07/31/17 0035  HGB 9.4*  HCT 29.5*  PLT 288  LABPROT 17.9*  INR 1.49  CREATININE 7.39*    Estimated Creatinine Clearance: 12.3 mL/min (A) (by C-G formula based on SCr of 7.39 mg/dL (H)).   Medical History: Past Medical History:  Diagnosis Date  . Angina   . ASCVD (arteriosclerotic cardiovascular disease)    , Anterior infarction 2005, LAD diagonal bifurcation intervention 03/2004  . Automatic implantable cardiac defibrillator -St. Jude's       . Benign neoplasm of colon   . CHF (congestive heart failure) ([place])   . Chronic systolic heart failure ([place])   . Coronary artery disease     Widely patent previously placed stents in the left anterior   . Crohn's disease ([place])   . 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 Dyspnea   . Gastroparesis   . GERD (gastroesophageal reflux disease)   . High cholesterol   . Hyperlipidemia   . Hypersomnolent    Previous diagnosis of narcolepsy  . Hypertension, essential   . Ischemic cardiomyopathy    Ejection fraction 15-20% catheterization 2010  . Type II or unspecified type diabetes mellitus without mention of complication, not stated as uncontrolled   . Unspecified gastritis and gastroduodenitis without mention of hemorrhage     Assessment: 51yo male c/o N/V, contacted LVAD coordinator who asked pt to report to ED, to continue Coumadin though INR is below goal >> to start heparin bridge.  Goal of Therapy:  INR 2-3 Heparin level 0.3-0.7  units/ml Monitor platelets by anticoagulation protocol: Yes   Plan:  Will begin heparin gtt at previously therapeutic rate of 1200 units/hr and monitor heparin levels and CBC.  Will give Coumadin 7.71m x1 today and monitor INR for dose adjustments.  VWynona Neat PharmD, BCPS  07/31/2017,3:55 AM

## 2017-07-31 NOTE — Progress Notes (Signed)
ANTICOAGULATION CONSULT NOTE - Initial Consult  Pharmacy Consult for heparin and warfarin Indication: LVAD, h/o DVT, and factor V leiden  Allergies  Allergen Reactions  . Metformin And Related Diarrhea    Patient Measurements: Height: 5' 8"  (172.7 cm) Weight: 181 lb 14.1 oz (82.5 kg) IBW/kg (Calculated) : 68.4  Vital Signs: Temp: 98 F (36.7 C) (11/26 1204) Temp Source: Oral (11/26 1204) BP: 150/107 (11/26 1252) Pulse Rate: 86 (11/26 0830)  Labs: Recent Labs    07/31/17 0035 07/31/17 0405 07/31/17 1235  HGB 9.4* 8.4*  --   HCT 29.5* 26.3*  --   PLT 288 278  --   LABPROT 17.9*  --   --   INR 1.49  --   --   HEPARINUNFRC  --   --  0.31  CREATININE 7.39* 7.33*  --     Estimated Creatinine Clearance: 12.3 mL/min (A) (by C-G formula based on SCr of 7.33 mg/dL (H)).   Medical History: Past Medical History:  Diagnosis Date  . Angina   . ASCVD (arteriosclerotic cardiovascular disease)    , Anterior infarction 2005, LAD diagonal bifurcation intervention 03/2004  . Automatic implantable cardiac defibrillator -St. Jude's       . Benign neoplasm of colon   . CHF (congestive heart failure) ([place])   . Chronic systolic heart failure ([place])   . Coronary artery disease     Widely patent previously placed stents in the left anterior   . Crohn's disease ([place])   . 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 Dyspnea   . Gastroparesis   . GERD (gastroesophageal reflux disease)   . High cholesterol   . Hyperlipidemia   . Hypersomnolent    Previous diagnosis of narcolepsy  . Hypertension, essential   . Ischemic cardiomyopathy    Ejection fraction 15-20% catheterization 2010  . Type II or unspecified type diabetes mellitus without mention of complication, not stated as uncontrolled   . Unspecified gastritis and gastroduodenitis without mention of hemorrhage     Assessment: 51yo male c/o N/V, contacted LVAD coordinator who asked pt to report to ED, to continue  Coumadin though INR is below goal >> to start heparin bridge. Heparin drip 1200 uts/hr HL 0.31 at goal, CBC stable, INR  Goal of Therapy:  INR 2-3 Heparin level 0.3-0.7 units/ml Monitor platelets by anticoagulation protocol: Yes   Plan:  Continue  heparin gtt at 1200 units/hr   heparin levels and CBC daily Will give Coumadin 7.33m x1 today Daily INR  LBonnita NasutiPharm.D. CPP, BCPS Clinical Pharmacist [phone]/26/2018 2:29 PM

## 2017-07-31 NOTE — ED Provider Notes (Signed)
[facility] EMERGENCY DEPARTMENT Provider Note   CSN: [id number] Arrival date & time: 07/31/17  0001     History   Chief Complaint Chief Complaint  Patient presents with  . Emesis    HPI [name] is a 51 y.o. male.  Patient referred to the emergency department by Dr. Haroldine Laws, his cardiologist.  Patient has an LVAD.  He reports that he started having nausea and vomiting earlier today.  He has had vomiting throughout the day.  He has not had any associated diarrhea.  Patient also denies abdominal pain.  He has not had any fever or upper respiratory infection symptoms.      Past Medical History:  Diagnosis Date  . Angina   . ASCVD (arteriosclerotic cardiovascular disease)    , Anterior infarction 2005, LAD diagonal bifurcation intervention 03/2004  . Automatic implantable cardiac defibrillator -St. Jude's       . Benign neoplasm of colon   . CHF (congestive heart failure) ([place])   . Chronic systolic heart failure ([place])   . Coronary artery disease     Widely patent previously placed stents in the left anterior   . Crohn's disease ([place])   . 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 Dyspnea   . Gastroparesis   . GERD (gastroesophageal reflux disease)   . High cholesterol   . Hyperlipidemia   . Hypersomnolent    Previous diagnosis of narcolepsy  . Hypertension, essential   . Ischemic cardiomyopathy    Ejection fraction 15-20% catheterization 2010  . Type II or unspecified type diabetes mellitus without mention of complication, not stated as uncontrolled   . Unspecified gastritis and gastroduodenitis without mention of hemorrhage     Patient Active Problem List   Diagnosis Date Noted  . 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) ([place]) 07/10/2017  . LVAD (left ventricular assist device) present ([place])   . Palliative care by specialist   . Acute on chronic systolic CHF (congestive heart failure) ([place]) 06/07/2017  .  ACP (advance care planning)   . Goals of care, counseling/discussion   . Diarrhea   . Palliative care encounter   . Nausea   . CHF exacerbation ([place]) 04/05/2017  . Diabetic keto-acidosis ([place]) 04/05/2017  . Type 2 diabetes mellitus with complication, with long-term current use of insulin ([place]) 03/08/2017  . Acute on chronic combined systolic and diastolic CHF (congestive heart failure) ([place]) 02/08/2017  . CKD (chronic kidney disease), stage III ([place]) 01/17/2017  . AKI (acute kidney injury) ([place])   . Non-ST elevation (NSTEMI) myocardial infarction ([place])   . CHF (congestive heart failure) ([place]) 09/02/2016  . Elevated serum creatinine   . Encounter for therapeutic drug monitoring 08/03/2016  . Chest pain 04/26/2012  . SVT (supraventricular tachycardia) ([place]) 04/26/2012  . Gastroparesis due to DM ([place]) 11/18/2011  . Nausea & vomiting 11/17/2011  . Coronary artery disease   . Ischemic cardiomyopathy   . Essential hypertension   . Automatic implantable cardioverter-defibrillator in situ   . Deep venous thrombosis ([place])   . Hypersomnolent   . POLYP, COLON 09/25/2008  . Type 2 diabetes mellitus ([place]) 09/25/2008  . Dyslipidemia, goal LDL below 70 09/25/2008  . GASTRITIS 09/25/2008  . DIVERTICULOSIS, COLON 09/25/2008  . RECTAL MASS 09/25/2008    Past Surgical History:  Procedure Laterality Date  . APPENDECTOMY    . AV FISTULA PLACEMENT Right 06/26/2017   Procedure: ARTERIOVENOUS (AV) FISTULA CREATION VERSUS GRAFT RIGHT  ARM;  Surgeon: Conrad Middleton, MD;  Location: [facility];  Service: Vascular;  Laterality: Right;  . CARDIAC DEFIBRILLATOR PLACEMENT  2010   St. Jude ICD  . COLECTOMY  ~ 2003   "for Crohn's" ascending colon.    . CORONARY STENT INTERVENTION N/A 11/25/2016   Procedure: Coronary Stent Intervention;  Surgeon: Leonie Man, MD;  Location: [facility];  Service: Cardiovascular;  Laterality: N/A;  . EP IMPLANTABLE DEVICE N/A 09/14/2016   Procedure: ICD Generator  Changeout;  Surgeon: Deboraha Sprang, MD;  Location: [facility];  Service: Cardiovascular;  Laterality: N/A;  . ESOPHAGOGASTRODUODENOSCOPY N/A 07/12/2017   Procedure: ESOPHAGOGASTRODUODENOSCOPY (EGD);  Surgeon: Jerene Bears, MD;  Location: [facility];  Service: Gastroenterology;  Laterality: N/A;  VAD pt so VAD team will need to accompany pt.   [name] FETAL SURGERY FOR CONGENITAL HERNIA     ????? pt has no knowledge of this..    . INGUINAL HERNIA REPAIR    . INSERTION OF DIALYSIS CATHETER Right 06/26/2017   Procedure: INSERTION OF TUNNELED  DIALYSIS CATHETER RIGHT INTERNAL JUGULAR;  Surgeon: Conrad [place], MD;  Location: [facility];  Service: Vascular;  Laterality: Right;  . INSERTION OF IMPLANTABLE LEFT VENTRICULAR ASSIST DEVICE N/A 06/12/2017   Procedure: INSERTION OF IMPLANTABLE LEFT VENTRICULAR ASSIST [place] 3;  Surgeon: Ivin Poot, MD;  Location: [facility];  Service: Open Heart Surgery;  Laterality: N/A;  HM3 LVAD  CIRC ARREST  NITRIC OXIDE  . IR THORACENTESIS ASP PLEURAL SPACE W/IMG GUIDE  06/28/2017  . MULTIPLE EXTRACTIONS WITH ALVEOLOPLASTY N/A 06/08/2017   Procedure: MULTIPLE EXTRACTION WITH ALVEOLOPLASTY AND PRE PROSTHETIC SURGERY AS NEEDED;  Surgeon: Lenn Cal, DDS;  Location: [facility];  Service: Oral Surgery;  Laterality: N/A;  . RIGHT HEART CATH N/A 06/07/2017   Procedure: RIGHT HEART CATH;  Surgeon: Larey Dresser, MD;  Location: [facility];  Service: Cardiovascular;  Laterality: N/A;  . RIGHT/LEFT HEART CATH AND CORONARY ANGIOGRAPHY N/A 11/23/2016   Procedure: Right/Left Heart Cath and Coronary Angiography;  Surgeon: Larey Dresser, MD;  Location: [facility];  Service: Cardiovascular;  Laterality: N/A;  . TEE WITHOUT CARDIOVERSION N/A 06/12/2017   Procedure: TRANSESOPHAGEAL ECHOCARDIOGRAM (TEE);  Surgeon: Prescott Gum, Collier Salina, MD;  Location: [facility];  Service: Open Heart Surgery;  Laterality: N/A;       Home Medications    Prior to Admission  medications   Medication Sig Start Date End Date Taking? Authorizing Provider  amiodarone (PACERONE) 200 MG tablet Take 1 tablet (200 mg total) by mouth daily. 07/03/17  Yes Clegg, Amy D, NP  amLODipine (NORVASC) 5 MG tablet Take 1 tablet (13m) on Tuesday, Thursday, Saturday and Sunday (non HD days) by mouth 07/18/17  Yes MLarey Dresser MD  aspirin EC 81 MG tablet Take 1 tablet (81 mg total) by mouth daily. 07/26/17  Yes MLarey Dresser MD  atorvastatin (LIPITOR) 40 MG tablet Take 1 tablet (40 mg total) daily at 6 PM by mouth. 07/16/17  Yes Bhagat, Bhavinkumar, PA  hydrALAZINE (APRESOLINE) 25 MG tablet Take 37.5 mg three times a day on non hemodialysis days and in the evening on dialysis days. Patient taking differently: 12.5 mg. Take 12.5 mg three times a day on non hemodialysis days and in the evening on dialysis days. 07/16/17  Yes Bhagat, Bhavinkumar, PA  insulin aspart (NOVOLOG) 100 UNIT/ML injection Inject 3 Units into the skin 3 (three) times daily with meals. 07/03/17  Yes  Clegg, Amy D, NP  insulin glargine (LANTUS) 100 UNIT/ML injection Inject 0.05 mLs (5 Units total) into the skin daily. 07/03/17  Yes Clegg, Amy D, NP  metoCLOPramide (REGLAN) 5 MG tablet Take 1 tablet (5 mg total) 2 (two) times daily before lunch and supper by mouth. 07/18/17  Yes Larey Dresser, MD  nitroGLYCERIN (NITROSTAT) 0.4 MG SL tablet Place 1 tablet (0.4 mg total) under the tongue every 5 (five) minutes as needed for chest pain. 07/13/16  Yes Jerline Pain, MD  ondansetron (ZOFRAN ODT) 4 MG disintegrating tablet Take 1 tablet (4 mg total) by mouth every 6 (six) hours as needed for nausea or vomiting. 01/10/17  Yes Esterwood, Amy S, PA-C  pantoprazole (PROTONIX) 40 MG tablet Take 40 mg by mouth daily.   Yes [provider]  warfarin (COUMADIN) 5 MG tablet Take 5 mg by mouth at bedtime. Take 5 mg (1 tablet) daily except/ 7.5 mg (1 and 1/2 tablets) on Tues/Thurs.   Yes [provider]  docusate  sodium (COLACE) 100 MG capsule Take 2 capsules (200 mg total) by mouth daily. Patient not taking: Reported on 07/26/2017 07/03/17   Darrick Grinder D, NP  erythromycin (E-MYCIN) 250 MG tablet Take 1 tablet (250 mg total) 3 (three) times daily by mouth. Patient not taking: Reported on 07/31/2017 07/16/17   Leanor Kail, PA  sildenafil (REVATIO) 20 MG tablet Take 1 tablet (20 mg total) by mouth 3 (three) times daily. Patient not taking: Reported on 07/31/2017 07/26/17   Larey Dresser, MD    Family History Family History  Problem Relation Age of Onset  . Colon polyps Mother   . Diabetes Mother   . Heart attack Father   . Diabetes Father   . Stroke Paternal Grandfather   . Colitis Sister   . Heart disease Brother   . Colitis Sister   . Colon cancer Neg Hx     Social History Social History   Tobacco Use  . Smoking status: Never Smoker  . Smokeless tobacco: Never Used  Substance Use Topics  . Alcohol use: No  . Drug use: No     Allergies   Metformin and related   Review of Systems Review of Systems  Gastrointestinal: Positive for nausea and vomiting.  All other systems reviewed and are negative.    Physical Exam Updated Vital Signs BP (!) 131/102 (BP Location: Left Arm)   Pulse 85   Temp 98.3 F (36.8 C) (Oral)   Resp 16   Ht 5' 8"  (1.727 m)   Wt 83 kg (183 lb)   SpO2 96%   BMI 27.83 kg/m   Physical Exam  Constitutional: He is oriented to person, place, and time. He appears well-developed and well-nourished. No distress.  HENT:  Head: Normocephalic and atraumatic.  Right Ear: Hearing normal.  Left Ear: Hearing normal.  Nose: Nose normal.  Mouth/Throat: Oropharynx is clear and moist and mucous membranes are normal.  Eyes: Conjunctivae and EOM are normal. Pupils are equal, round, and reactive to light.  Neck: Normal range of motion. Neck supple.  Cardiovascular: Regular rhythm, S1 normal and S2 normal. Exam reveals no gallop and no friction rub.  No  murmur heard. LVAD present  Pulmonary/Chest: Effort normal and breath sounds normal. No respiratory distress. He exhibits no tenderness.  Abdominal: Soft. Normal appearance and bowel sounds are normal. There is no hepatosplenomegaly. There is no tenderness. There is no rebound, no guarding, no tenderness at McBurney's point and negative  Murphy's sign. No hernia.  Musculoskeletal: Normal range of motion.  Neurological: He is alert and oriented to person, place, and time. He has normal strength. No cranial nerve deficit or sensory deficit. Coordination normal. GCS eye subscore is 4. GCS verbal subscore is 5. GCS motor subscore is 6.  Skin: Skin is warm, dry and intact. No rash noted. No cyanosis.  Psychiatric: He has a normal mood and affect. His speech is normal and behavior is normal. Thought content normal.  Nursing note and vitals reviewed.    ED Treatments / Results  Labs (all labs ordered are listed, but only abnormal results are displayed) Labs Reviewed  CBC WITH DIFFERENTIAL/PLATELET - Abnormal; Notable for the following components:      Result Value   RBC 3.73 (*)    Hemoglobin 9.4 (*)    HCT 29.5 (*)    MCH 25.2 (*)    RDW 17.3 (*)    All other components within normal limits  COMPREHENSIVE METABOLIC PANEL - Abnormal; Notable for the following components:   Chloride 100 (*)    Glucose, Bld 116 (*)    BUN 47 (*)    Creatinine, Ser 7.39 (*)    Albumin 2.8 (*)    ALT 15 (*)    Alkaline Phosphatase 130 (*)    GFR calc non Af Amer 8 (*)    GFR calc Af Amer 9 (*)    All other components within normal limits  PROTIME-INR - Abnormal; Notable for the following components:   Prothrombin Time 17.9 (*)    All other components within normal limits  LACTATE DEHYDROGENASE - Abnormal; Notable for the following components:   LDH 216 (*)    All other components within normal limits    EKG  EKG Interpretation None       Radiology Dg Abd Acute W/chest  Result Date:  07/31/2017 CLINICAL DATA:  Vomiting EXAM: DG ABDOMEN ACUTE W/ 1V CHEST COMPARISON:  07/13/2017, CT 07/11/2017 cava radiograph 06/18/2017 FINDINGS: Single-view chest demonstrates post sternotomy changes. Left-sided pacing device as before. Right-sided central venous catheter tips overlie the right atrium. LVAD similar in position. Cardiomegaly. No pneumothorax. Partial atelectasis or consolidation at the left base, not significantly changed. Supine and upright views of the abdomen demonstrate no free air beneath the diaphragm. Non obstructed bowel gas pattern with moderate stool. No abnormal calcification. IMPRESSION: 1. Low lung volumes with small left pleural effusion and partial consolidation at the left lung base cough blood not much interval change since 07/13/2017. Cardiomegaly. 2. Nonobstructed bowel-gas pattern Electronically Signed   By: Donavan Foil M.D.   On: 07/31/2017 02:59    Procedures Procedures (including critical care time)  Medications Ordered in ED Medications  ondansetron (ZOFRAN) injection 4 mg (4 mg Intravenous Given 07/31/17 0120)  sodium chloride 0.9 % bolus 1,000 mL (1,000 mLs Intravenous New Bag/Given 07/31/17 0120)  metoCLOPramide (REGLAN) injection 5 mg (5 mg Intravenous Given 07/31/17 0224)  promethazine (PHENERGAN) injection 12.5 mg (12.5 mg Intravenous Given 07/31/17 0317)     Initial Impression / Assessment and Plan / ED Course  I have reviewed the triage vital signs and the nursing notes.  Pertinent labs & imaging results that were available during my care of the patient were reviewed by me and considered in my medical decision making (see chart for details).     Patient presents to the emergency department for evaluation of nausea and vomiting.  Patient has a history of gastroparesis with similar presentation.  He has  not experiencing abdominal pain.  Abdominal exam is benign, nontender.  He does not have a fever or leukocytosis.  X-ray is unremarkable.  This  is consistent with gastroparesis, no concern for acute surgical process.  Patient administered 1 L of normal saline solution.  He is a dialysis patient, cannot administer any more fluids.  He was given IV Zofran and IV Reglan without improvement.  He was given IV Phenergan continues to have nausea and intermittent vomiting.  Discussed briefly with Dr. Haroldine Laws, was placed in observation for further management.  Final Clinical Impressions(s) / ED Diagnoses   Final diagnoses:  Intractable vomiting with nausea, unspecified vomiting type  Gastroparesis    ED Discharge Orders    None       Orpah Greek, MD 07/31/17 [phone]

## 2017-07-31 NOTE — Consult Note (Signed)
Renal Service Consult Note [facility]  [name] 07/31/2017 [name] Requesting Physician:  Dr Haroldine Laws  Reason for Consult:  ESRD pt with N/V, gastroparesis HPI: The patient is a 51 y.o. year-old with hx of HTN, DM2 , DVT who has hx of MI/ cardiogenic shock sp LVAD and ICD.  He started dialysis about 1 month ago, gets HD on MWF schedule at OP HD unit.  Admitted yest for refractory N/V, hx of gastroparesis.  He has no complaints with HD.  Will plan on HD tomorrow off schedule, no indication for urgent HD today.    ROS  denies CP, SOB  no joint pain   no HA  no blurry vision  no rash     Past Medical History  Past Medical History:  Diagnosis Date  . Angina   . ASCVD (arteriosclerotic cardiovascular disease)    , Anterior infarction 2005, LAD diagonal bifurcation intervention 03/2004  . Automatic implantable cardiac defibrillator -St. Jude's       . Benign neoplasm of colon   . CHF (congestive heart failure) ([place])   . Chronic systolic heart failure ([place])   . Coronary artery disease     Widely patent previously placed stents in the left anterior   . Crohn's disease ([place])   . 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 Dyspnea   . Gastroparesis   . GERD (gastroesophageal reflux disease)   . High cholesterol   . Hyperlipidemia   . Hypersomnolent    Previous diagnosis of narcolepsy  . Hypertension, essential   . Ischemic cardiomyopathy    Ejection fraction 15-20% catheterization 2010  . Type II or unspecified type diabetes mellitus without mention of complication, not stated as uncontrolled   . Unspecified gastritis and gastroduodenitis without mention of hemorrhage    Past Surgical History  Past Surgical History:  Procedure Laterality Date  . APPENDECTOMY    . AV FISTULA PLACEMENT Right 06/26/2017   Procedure: ARTERIOVENOUS (AV) FISTULA CREATION VERSUS GRAFT RIGHT  ARM;  Surgeon: Conrad [place], MD;  Location: [facility];   Service: Vascular;  Laterality: Right;  . CARDIAC DEFIBRILLATOR PLACEMENT  2010   St. Jude ICD  . COLECTOMY  ~ 2003   "for Crohn's" ascending colon.    . CORONARY STENT INTERVENTION N/A 11/25/2016   Procedure: Coronary Stent Intervention;  Surgeon: Leonie Man, MD;  Location: [facility];  Service: Cardiovascular;  Laterality: N/A;  . EP IMPLANTABLE DEVICE N/A 09/14/2016   Procedure: ICD Generator Changeout;  Surgeon: Deboraha Sprang, MD;  Location: [facility];  Service: Cardiovascular;  Laterality: N/A;  . ESOPHAGOGASTRODUODENOSCOPY N/A 07/12/2017   Procedure: ESOPHAGOGASTRODUODENOSCOPY (EGD);  Surgeon: Jerene Bears, MD;  Location: [facility] ENDOSCOPY;  Service: Gastroenterology;  Laterality: N/A;  VAD pt so VAD team will need to accompany pt.   [name] FETAL SURGERY FOR CONGENITAL HERNIA     ????? pt has no knowledge of this..    . INGUINAL HERNIA REPAIR    . INSERTION OF DIALYSIS CATHETER Right 06/26/2017   Procedure: INSERTION OF TUNNELED  DIALYSIS CATHETER RIGHT INTERNAL JUGULAR;  Surgeon: Conrad [place], MD;  Location: [facility];  Service: Vascular;  Laterality: Right;  . INSERTION OF IMPLANTABLE LEFT VENTRICULAR ASSIST DEVICE N/A 06/12/2017   Procedure: INSERTION OF IMPLANTABLE LEFT VENTRICULAR ASSIST [place] 3;  Surgeon: Ivin Poot, MD;  Location: [facility];  Service: Open Heart Surgery;  Laterality: N/A;  HM3 LVAD  CIRC ARREST  NITRIC OXIDE  . IR THORACENTESIS ASP PLEURAL SPACE W/IMG GUIDE  06/28/2017  . MULTIPLE EXTRACTIONS WITH ALVEOLOPLASTY N/A 06/08/2017   Procedure: MULTIPLE EXTRACTION WITH ALVEOLOPLASTY AND PRE PROSTHETIC SURGERY AS NEEDED;  Surgeon: Lenn Cal, DDS;  Location: [facility];  Service: Oral Surgery;  Laterality: N/A;  . RIGHT HEART CATH N/A 06/07/2017   Procedure: RIGHT HEART CATH;  Surgeon: Larey Dresser, MD;  Location: [facility];  Service: Cardiovascular;  Laterality: N/A;  . RIGHT/LEFT HEART CATH AND CORONARY ANGIOGRAPHY N/A 11/23/2016    Procedure: Right/Left Heart Cath and Coronary Angiography;  Surgeon: Larey Dresser, MD;  Location: [facility];  Service: Cardiovascular;  Laterality: N/A;  . TEE WITHOUT CARDIOVERSION N/A 06/12/2017   Procedure: TRANSESOPHAGEAL ECHOCARDIOGRAM (TEE);  Surgeon: Prescott Gum, Collier Salina, MD;  Location: [facility];  Service: Open Heart Surgery;  Laterality: N/A;   Family History  Family History  Problem Relation Age of Onset  . Colon polyps Mother   . Diabetes Mother   . Heart attack Father   . Diabetes Father   . Stroke Paternal Grandfather   . Colitis Sister   . Heart disease Brother   . Colitis Sister   . Colon cancer Neg Hx    Social History  reports that  has never smoked. he has never used smokeless tobacco. He reports that he does not drink alcohol or use drugs. Allergies  Allergies  Allergen Reactions  . Metformin And Related Diarrhea   Home medications Prior to Admission medications   Medication Sig Start Date End Date Taking? Authorizing Provider  amiodarone (PACERONE) 200 MG tablet Take 1 tablet (200 mg total) by mouth daily. 07/03/17  Yes Clegg, Amy D, NP  amLODipine (NORVASC) 5 MG tablet Take 1 tablet (23m) on Tuesday, Thursday, Saturday and Sunday (non HD days) by mouth 07/18/17  Yes MLarey Dresser MD  aspirin EC 81 MG tablet Take 1 tablet (81 mg total) by mouth daily. 07/26/17  Yes MLarey Dresser MD  atorvastatin (LIPITOR) 40 MG tablet Take 1 tablet (40 mg total) daily at 6 PM by mouth. 07/16/17  Yes Bhagat, Bhavinkumar, PA  hydrALAZINE (APRESOLINE) 25 MG tablet Take 37.5 mg three times a day on non hemodialysis days and in the evening on dialysis days. Patient taking differently: 12.5 mg. Take 12.5 mg three times a day on non hemodialysis days and in the evening on dialysis days. 07/16/17  Yes Bhagat, Bhavinkumar, PA  insulin aspart (NOVOLOG) 100 UNIT/ML injection Inject 3 Units into the skin 3 (three) times daily with meals. 07/03/17  Yes Clegg, Amy D, NP  insulin  glargine (LANTUS) 100 UNIT/ML injection Inject 0.05 mLs (5 Units total) into the skin daily. 07/03/17  Yes Clegg, Amy D, NP  metoCLOPramide (REGLAN) 5 MG tablet Take 1 tablet (5 mg total) 2 (two) times daily before lunch and supper by mouth. 07/18/17  Yes MLarey Dresser MD  nitroGLYCERIN (NITROSTAT) 0.4 MG SL tablet Place 1 tablet (0.4 mg total) under the tongue every 5 (five) minutes as needed for chest pain. 07/13/16  Yes SJerline Pain MD  ondansetron (ZOFRAN ODT) 4 MG disintegrating tablet Take 1 tablet (4 mg total) by mouth every 6 (six) hours as needed for nausea or vomiting. 01/10/17  Yes Esterwood, Amy S, PA-C  pantoprazole (PROTONIX) 40 MG tablet Take 40 mg by mouth daily.   Yes [provider]  warfarin (COUMADIN) 5 MG tablet Take 5 mg by mouth  at bedtime. Take 5 mg (1 tablet) daily except/ 7.5 mg (1 and 1/2 tablets) on Tues/Thurs.   Yes [provider]  docusate sodium (COLACE) 100 MG capsule Take 2 capsules (200 mg total) by mouth daily. Patient not taking: Reported on 07/26/2017 07/03/17   Darrick Grinder D, NP  erythromycin (E-MYCIN) 250 MG tablet Take 1 tablet (250 mg total) 3 (three) times daily by mouth. Patient not taking: Reported on 07/31/2017 07/16/17   Leanor Kail, PA  sildenafil (REVATIO) 20 MG tablet Take 1 tablet (20 mg total) by mouth 3 (three) times daily. Patient not taking: Reported on 07/31/2017 07/26/17   Larey Dresser, MD   Liver Function Tests Recent Labs  Lab 07/31/17 0035  AST 16  ALT 15*  ALKPHOS 130*  BILITOT 0.8  PROT 7.0  ALBUMIN 2.8*   No results for input(s): LIPASE, AMYLASE in the last 168 hours. CBC Recent Labs  Lab 07/26/17 1115 07/31/17 0035 07/31/17 0405  WBC 8.9 8.7 11.0*  NEUTROABS  --  7.3  --   HGB 8.7* 9.4* 8.4*  HCT 28.0* 29.5* 26.3*  MCV 80.7 79.1 79.7  PLT 348 288 086   Basic Metabolic Panel Recent Labs  Lab 07/26/17 1115 07/31/17 0035 07/31/17 0405  NA 131* 136 136  K 4.5 4.2 4.6  CL 96* 100*  102  CO2 24 24 18*  GLUCOSE 229* 116* 152*  BUN 22* 47* 48*  CREATININE 4.56* 7.39* 7.33*  CALCIUM 8.3* 8.9 8.6*   Iron/TIBC/Ferritin/ %Sat    Component Value Date/Time   IRON 15 (L) 06/27/2017 1152   TIBC 251 06/27/2017 1152   FERRITIN 235.1 09/14/2007 1003   IRONPCTSAT 6 (L) 06/27/2017 1152    Vitals:   07/31/17 0733 07/31/17 0830 07/31/17 1204 07/31/17 1252  BP:  (!) 143/101  (!) 150/107  Pulse: 82 86    Resp: 15 17    Temp:  98.1 F (36.7 C) 98 F (36.7 C)   TempSrc:  Oral Oral   SpO2: 97% 97%    Weight:  82.5 kg (181 lb 14.1 oz)    Height:  5' 8"  (1.727 m)     Exam Gen alert, no distress No rash, cyanosis or gangrene Sclera anicteric, throat clear   No jvd or bruits Chest clear bilat RRR no MRG Abd soft ntnd , driveline in L mid abd for lvad GU normal male MS no joint effusions or deformity Ext no LE edema / no wounds or ulcers Neuro is alert, Ox 3 , nf  R IJ TDC    Dialysis: MWF [facility] 4h 10mn  81.5kg  3K/2Ca bath  Hep 8000 bolus   R IJ TDC - Venofer 1078mx 10 ordered (thru 08/18/17 ) - Mircera 10041mIV q 2 weeks ordered (last 11/20 at OP unit) - Calcitriol 0.5mc52mO q HD      Impression: 1. Nausea/ vomiting - hx gastroparesis, per primary team 2. ESRD on HD MWF. 3K bath.  3. ICM/ chron syst CHF/ sp ICD/ sp LVAD - on IV heparin, per cards 4. Anemia of CKD - Hb 8- 10 range. Next esa due on 12/4. Cont IV fe load.  5. MBD of CKD - cont vdra/ binders 6. Vol - no vol excess 7. CAD - not active 8. HTN - on norvasc/ hydral at home, on hold here. Per cards 9. DM 10. Hx DVT/ factor V hetero - on coumadin at home    Plan - HD tomorrow off schedule  Kelly Splinter MD [employer] pager [phone]   07/31/2017, 1:32 PM

## 2017-07-31 NOTE — ED Triage Notes (Signed)
Pt states he started to have nausea and vomiting this morning. Pt is an LVAD patient and was told to come in to the ED. Pt states he took some home ODT zofran with no relief. Pt states he is unable to keep anything down.

## 2017-07-31 NOTE — Telephone Encounter (Signed)
Late entry: received page from patient stating he is vomiting and unable to tolerate medications. Patient has attempted to take zofran without relief. Daughter is bringing patient to the ED. Dr Haroldine Laws and ED staff made aware.

## 2017-07-31 NOTE — Progress Notes (Signed)
Patients auscultated cuff pressure 142. Automatic pressure 143/101 (115). Amy, NP aware. Pt currently dry heaving. RN will continue to monitor and administer PRNs as appropriate.

## 2017-07-31 NOTE — Consult Note (Signed)
[facility] Renal Consultation Note  Indication for Consultation:  Management of ESRD/hemodialysis; anemia, hypertension/volume and secondary hyperparathyroidism  HPI: [name] is a 51 y.o. male. See dr Jonnie Finner note      Past Medical History:  Diagnosis Date  . Angina   . ASCVD (arteriosclerotic cardiovascular disease)    , Anterior infarction 2005, LAD diagonal bifurcation intervention 03/2004  . Automatic implantable cardiac defibrillator -[employer] Jude's       . Benign neoplasm of colon   . CHF (congestive heart failure) ([place])   . Chronic systolic heart failure ([place])   . Coronary artery disease     Widely patent previously placed stents in the left anterior   . Crohn's disease ([place])   . 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 Dyspnea   . Gastroparesis   . GERD (gastroesophageal reflux disease)   . High cholesterol   . Hyperlipidemia   . Hypersomnolent    Previous diagnosis of narcolepsy  . Hypertension, essential   . Ischemic cardiomyopathy    Ejection fraction 15-20% catheterization 2010  . Type II or unspecified type diabetes mellitus without mention of complication, not stated as uncontrolled   . Unspecified gastritis and gastroduodenitis without mention of hemorrhage     Past Surgical History:  Procedure Laterality Date  . APPENDECTOMY    . AV FISTULA PLACEMENT Right 06/26/2017   Procedure: ARTERIOVENOUS (AV) FISTULA CREATION VERSUS GRAFT RIGHT  ARM;  Surgeon: Conrad [place], MD;  Location: [place];  Service: Vascular;  Laterality: Right;  . CARDIAC DEFIBRILLATOR PLACEMENT  2010   St. Jude ICD  . COLECTOMY  ~ 2003   "for Crohn's" ascending colon.    . CORONARY STENT INTERVENTION N/A 11/25/2016   Procedure: Coronary Stent Intervention;  Surgeon: Leonie Man, MD;  Location: [place] [facility];  Service: Cardiovascular;  Laterality: N/A;  . EP IMPLANTABLE DEVICE N/A 09/14/2016   Procedure: ICD Generator Changeout;  Surgeon:  Deboraha Sprang, MD;  Location: [place] [facility];  Service: Cardiovascular;  Laterality: N/A;  . ESOPHAGOGASTRODUODENOSCOPY N/A 07/12/2017   Procedure: ESOPHAGOGASTRODUODENOSCOPY (EGD);  Surgeon: Jerene Bears, MD;  Location: [facility] ENDOSCOPY;  Service: Gastroenterology;  Laterality: N/A;  VAD pt so VAD team will need to accompany pt.   [name] FETAL SURGERY FOR CONGENITAL HERNIA     ????? pt has no knowledge of this..    . INGUINAL HERNIA REPAIR    . INSERTION OF DIALYSIS CATHETER Right 06/26/2017   Procedure: INSERTION OF TUNNELED  DIALYSIS CATHETER RIGHT INTERNAL JUGULAR;  Surgeon: Conrad [place], MD;  Location: [place];  Service: Vascular;  Laterality: Right;  . INSERTION OF IMPLANTABLE LEFT VENTRICULAR ASSIST DEVICE N/A 06/12/2017   Procedure: INSERTION OF IMPLANTABLE LEFT VENTRICULAR ASSIST [place] 3;  Surgeon: Ivin Poot, MD;  Location: [place];  Service: Open Heart Surgery;  Laterality: N/A;  HM3 LVAD  CIRC ARREST  NITRIC OXIDE  . IR THORACENTESIS ASP PLEURAL SPACE W/IMG GUIDE  06/28/2017  . MULTIPLE EXTRACTIONS WITH ALVEOLOPLASTY N/A 06/08/2017   Procedure: MULTIPLE EXTRACTION WITH ALVEOLOPLASTY AND PRE PROSTHETIC SURGERY AS NEEDED;  Surgeon: Lenn Cal, DDS;  Location: [place];  Service: Oral Surgery;  Laterality: N/A;  . RIGHT HEART CATH N/A 06/07/2017   Procedure: RIGHT HEART CATH;  Surgeon: Larey Dresser, MD;  Location: [place] [facility];  Service: Cardiovascular;  Laterality: N/A;  . RIGHT/LEFT HEART CATH AND CORONARY ANGIOGRAPHY N/A 11/23/2016   Procedure:  Right/Left Heart Cath and Coronary Angiography;  Surgeon: Larey Dresser, MD;  Location: [place] [facility];  Service: Cardiovascular;  Laterality: N/A;  . TEE WITHOUT CARDIOVERSION N/A 06/12/2017   Procedure: TRANSESOPHAGEAL ECHOCARDIOGRAM (TEE);  Surgeon: Prescott Gum, Collier Salina, MD;  Location: [place];  Service: Open Heart Surgery;  Laterality: N/A;      Family History  Problem Relation Age of Onset  . Colon polyps Mother    . Diabetes Mother   . Heart attack Father   . Diabetes Father   . Stroke Paternal Grandfather   . Colitis Sister   . Heart disease Brother   . Colitis Sister   . Colon cancer Neg Hx       reports that  has never smoked. he has never used smokeless tobacco. He reports that he does not drink alcohol or use drugs.   Allergies  Allergen Reactions  . Metformin And Related Diarrhea    Prior to Admission medications   Medication Sig Start Date End Date Taking? Authorizing Provider  amiodarone (PACERONE) 200 MG tablet Take 1 tablet (200 mg total) by mouth daily. 07/03/17  Yes Clegg, Amy D, NP  amLODipine (NORVASC) 5 MG tablet Take 1 tablet (74m) on Tuesday, Thursday, Saturday and Sunday (non HD days) by mouth 07/18/17  Yes MLarey Dresser MD  aspirin EC 81 MG tablet Take 1 tablet (81 mg total) by mouth daily. 07/26/17  Yes MLarey Dresser MD  atorvastatin (LIPITOR) 40 MG tablet Take 1 tablet (40 mg total) daily at 6 PM by mouth. 07/16/17  Yes Bhagat, Bhavinkumar, PA  hydrALAZINE (APRESOLINE) 25 MG tablet Take 37.5 mg three times a day on non hemodialysis days and in the evening on dialysis days. Patient taking differently: 12.5 mg. Take 12.5 mg three times a day on non hemodialysis days and in the evening on dialysis days. 07/16/17  Yes Bhagat, Bhavinkumar, PA  insulin aspart (NOVOLOG) 100 UNIT/ML injection Inject 3 Units into the skin 3 (three) times daily with meals. 07/03/17  Yes Clegg, Amy D, NP  insulin glargine (LANTUS) 100 UNIT/ML injection Inject 0.05 mLs (5 Units total) into the skin daily. 07/03/17  Yes Clegg, Amy D, NP  metoCLOPramide (REGLAN) 5 MG tablet Take 1 tablet (5 mg total) 2 (two) times daily before lunch and supper by mouth. 07/18/17  Yes MLarey Dresser MD  nitroGLYCERIN (NITROSTAT) 0.4 MG SL tablet Place 1 tablet (0.4 mg total) under the tongue every 5 (five) minutes as needed for chest pain. 07/13/16  Yes SJerline Pain MD  ondansetron (ZOFRAN ODT) 4 MG  disintegrating tablet Take 1 tablet (4 mg total) by mouth every 6 (six) hours as needed for nausea or vomiting. 01/10/17  Yes Esterwood, Amy S, PA-C  pantoprazole (PROTONIX) 40 MG tablet Take 40 mg by mouth daily.   Yes [provider]  warfarin (COUMADIN) 5 MG tablet Take 5 mg by mouth at bedtime. Take 5 mg (1 tablet) daily except/ 7.5 mg (1 and 1/2 tablets) on Tues/Thurs.   Yes [provider]  docusate sodium (COLACE) 100 MG capsule Take 2 capsules (200 mg total) by mouth daily. Patient not taking: Reported on 07/26/2017 07/03/17   CDarrick GrinderD, NP  erythromycin (E-MYCIN) 250 MG tablet Take 1 tablet (250 mg total) 3 (three) times daily by mouth. Patient not taking: Reported on 07/31/2017 07/16/17   BLeanor Kail PA  sildenafil (REVATIO) 20 MG tablet Take 1 tablet (20 mg total) by mouth 3 (three) times  daily. Patient not taking: Reported on 07/31/2017 07/26/17   Larey Dresser, MD      Results for orders placed or performed during the hospital encounter of 07/31/17 (from the past 48 hour(s))  CBC with Differential/Platelet     Status: Abnormal   Collection Time: 07/31/17 12:35 AM  Result Value Ref Range   WBC 8.7 4.0 - 10.5 K/uL   RBC 3.73 (L) 4.22 - 5.81 MIL/uL   Hemoglobin 9.4 (L) 13.0 - 17.0 g/dL   HCT 29.5 (L) 39.0 - 52.0 %   MCV 79.1 78.0 - 100.0 fL   MCH 25.2 (L) 26.0 - 34.0 pg   MCHC 31.9 30.0 - 36.0 g/dL   RDW 17.3 (H) 11.5 - 15.5 %   Platelets 288 150 - 400 K/uL   Neutrophils Relative % 84 %   Neutro Abs 7.3 1.7 - 7.7 K/uL   Lymphocytes Relative 9 %   Lymphs Abs 0.8 0.7 - 4.0 K/uL   Monocytes Relative 6 %   Monocytes Absolute 0.6 0.1 - 1.0 K/uL   Eosinophils Relative 1 %   Eosinophils Absolute 0.1 0.0 - 0.7 K/uL   Basophils Relative 0 %   Basophils Absolute 0.0 0.0 - 0.1 K/uL  Comprehensive metabolic panel     Status: Abnormal   Collection Time: 07/31/17 12:35 AM  Result Value Ref Range   Sodium 136 135 - 145 mmol/L   Potassium 4.2 3.5 - 5.1  mmol/L   Chloride 100 (L) 101 - 111 mmol/L   CO2 24 22 - 32 mmol/L   Glucose, Bld 116 (H) 65 - 99 mg/dL   BUN 47 (H) 6 - 20 mg/dL   Creatinine, Ser 7.39 (H) 0.61 - 1.24 mg/dL   Calcium 8.9 8.9 - 10.3 mg/dL   Total Protein 7.0 6.5 - 8.1 g/dL   Albumin 2.8 (L) 3.5 - 5.0 g/dL   AST 16 15 - 41 U/L   ALT 15 (L) 17 - 63 U/L   Alkaline Phosphatase 130 (H) 38 - 126 U/L   Total Bilirubin 0.8 0.3 - 1.2 mg/dL   GFR calc non Af Amer 8 (L) >60 mL/min   GFR calc Af Amer 9 (L) >60 mL/min    Comment: (NOTE) The eGFR has been calculated using the CKD EPI equation. This calculation has not been validated in all clinical situations. eGFR's persistently <60 mL/min signify possible Chronic Kidney Disease.    Anion gap 12 5 - 15  Protime-INR     Status: Abnormal   Collection Time: 07/31/17 12:35 AM  Result Value Ref Range   Prothrombin Time 17.9 (H) 11.4 - 15.2 seconds   INR 1.49   Lactate dehydrogenase     Status: Abnormal   Collection Time: 07/31/17 12:35 AM  Result Value Ref Range   LDH 216 (H) 98 - 192 U/L  CBC     Status: Abnormal   Collection Time: 07/31/17  4:05 AM  Result Value Ref Range   WBC 11.0 (H) 4.0 - 10.5 K/uL   RBC 3.30 (L) 4.22 - 5.81 MIL/uL   Hemoglobin 8.4 (L) 13.0 - 17.0 g/dL   HCT 26.3 (L) 39.0 - 52.0 %   MCV 79.7 78.0 - 100.0 fL   MCH 25.5 (L) 26.0 - 34.0 pg   MCHC 31.9 30.0 - 36.0 g/dL   RDW 17.4 (H) 11.5 - 15.5 %   Platelets 278 150 - 400 K/uL  Basic metabolic panel     Status: Abnormal   Collection Time:  07/31/17  4:05 AM  Result Value Ref Range   Sodium 136 135 - 145 mmol/L   Potassium 4.6 3.5 - 5.1 mmol/L   Chloride 102 101 - 111 mmol/L   CO2 18 (L) 22 - 32 mmol/L   Glucose, Bld 152 (H) 65 - 99 mg/dL   BUN 48 (H) 6 - 20 mg/dL   Creatinine, Ser 7.33 (H) 0.61 - 1.24 mg/dL   Calcium 8.6 (L) 8.9 - 10.3 mg/dL   GFR calc non Af Amer 8 (L) >60 mL/min   GFR calc Af Amer 9 (L) >60 mL/min    Comment: (NOTE) The eGFR has been calculated using the CKD EPI  equation. This calculation has not been validated in all clinical situations. eGFR's persistently <60 mL/min signify possible Chronic Kidney Disease.    Anion gap 16 (H) 5 - 15  Glucose, capillary     Status: Abnormal   Collection Time: 07/31/17  9:02 AM  Result Value Ref Range   Glucose-Capillary 199 (H) 65 - 99 mg/dL   Comment 1 Notify RN    Comment 2 Document in Chart      Physical Exam: Vitals:   07/31/17 0733 07/31/17 0830  BP:  (!) 143/101  Pulse: 82 86  Resp: 15 17  Temp:  98.1 F (36.7 C)  SpO2: 97% 97%     General:  HEENT:  Eyes:  Neck:  Heart:  Lungs:  Abdomen:  Extremities: Skin:  Neuro:  Dialysis Access:  Dialysis Orders: MWF at [facility] 4:15hr, BFR 400, DFR 800, EDW 81.5 kg, 3K/2Ca, TDC, Heparin 8000 bolus - Venofer 149m x 10 ordered (dc on 08/18/17 ) - Mircera 1068m IV q 2 weeks ordered (07/25/17 given op unit)) - Calcitriol 0.87m83mPO q HD    Assessment/Plan See dr SchJonnie Finnerte 1. ESRD -   2. Hypertension/volume  - 3. Anemia  -  4. Metabolic bone disease -   5. Nutrition -   DavErnest HaberA-C [facility] Beeper [id number]/26/2018, 10:57 AM

## 2017-07-31 NOTE — Consult Note (Signed)
[facility] Consult: 1:05 PM 07/31/2017  LOS: 0 days    Referring Provider: Dr Haroldine Laws  Primary Care Physician:  [name], MD Primary Gastroenterologist:  Dr. [name] >> [name]    Reason for Consultation:  Nausea, vomiting in pt with gastroparesis.    HPI: [name] is a 51 y.o. male. Type 2 DM, longstanding.  CAD.  MIs in 2005, with stenting.  CHF/cardiogenic shock, Non-STEMI leading to cardiac DES 11/2016.  Plavix added (but since stopped) to Coumadin then.  S/p ICD 2010.  S/p 06/12/17 LVAD per Dr Prescott Gum.   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  Factor V Leiden.   Pleural effusion, left thoracentesis 06/28/17.  Multiple tooth extractions 06/2017.  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  Lung nodules.  ADHD.  Narcolepsy.   Right inguinal hernia repair age 85. Gangrenous appendicitis with perforation, abscess, peritonitis, s/p ascending colectomy/appendectomy diverting ileostomy 02/2002. Ileostomy reversed 06/2002, post op course prolonged by N/V.   Pt has no knowledge of "fetal surgery for congenital hernia"   2002 Colonoscopy: inflammatory ano-rectal polyps c/w anal prolapse. Serologic markers positive for Crohns, so possible rectal Crohn's   2009 Colonoscopy for IDA, FOBT +, diarrhea.  Previous right colectomy.  Patent anastomosis.  Left sided tics.  Inflammatory rectal polyps, path: mucosal prolapse with focal  ulceration. 2009  EGD.  Nodular gastritis in cardia, antrum.  Biopsy: minor chronic gastritis, no villous atrophy or changes of sprue. 11/2011 GES: Delayed emptying. 85% retention at 60 minutes. 72% retention at 120 minutes. 04/28/17 CT ab/pelvis pre VAD:  Esophagus, biliary tract, GB, liver, pancreas unremarkable.   Intermittent N/V for years.  On  chronic Protonix, no reflux or heartburn sxs.  Frequent sense of abdominal fullness.  PO Zofran not helpful.  Long standing spells of loose stools but no blood or melena.  Reglan used in past but not clear if helpful; trial of 5 mg before meals along with PRN Zofran and step 3 Gastroparesis diet per [name] on 01/10/17.  Not clear he used this.  Has had intermittent elevation on Alk phos and transaminases.  Normal Lipase.   During 06/07/17 -07/04/17 heart failure and LVAD,  transfused with multiple blood products including 4 units PRBCs, platelets, and FFP.    Admitted 11/5 - 07/16/17 with nausea, vomiting: gastroparesis.   CT ab/pelvis: post-surgical right colon.  "dense" GB. Trace pericardial flui and small to moderate left pleural effusion.  LVAD and pacemaker in place.  Ultrasound abdomen: normal.  3 mm CBD.   Dr Hilarie Fredrickson felt N/V likely due to gastroparesis.   07/12/17 EGD (diagnostic as INR 5.4): 2 cm HH.  Fluid filled stomach, aspirated.  Rec: reglan 5 mg IV q 12 hours (renal dose), consider Erythromycin 250 mg PO/IV q 6 to 8 hours, Phenergan prn.  Consider Lorazepam for refractory sxs.   Discharged on PPI, Erythromycin 250 tid for 3 days, Reglan 5 mg po tid, ASA 325 mg q day.    Doing well, no n/v at cardiology OV on 11/21.  Has been eating smaller meals, taking RXd meds and not needing any anti-nauseals.  Daily stools.    Returns with repeated episodes N/V that began yesterday AM.   The emesis is bilious, nonbloody.  He had several episodes yesterday.  Frequency and volume of emesis has improved but nausea and vomiting not resolved today.  He had his first meal at lunch today, full liquids, he only drank the ice tea which she vomited up.  Patient reiterates that Zofran does not help him, Phenergan when given IV in the hospital is helpful but he does not have a prescription for this at home.  Last bowel movement was on Saturday.  Normally he is been having brown stools daily.  Denies abdominal or  chest pain.  No dysphagia/odynophagia.  No heartburn or indigestion.  No melenic or bloody stools.  No sick contacts.  He lives with his daughter and son-in-law in their young toddler child but the child has been away since 11/22 and has not been sick.  His home blood sugars have been running in the low 200s. AAS films show non-obstructed BG pattern.   Labs with expected elevation BUN/Creat.   WBCs to 11K.  Hgb 8.4 (10.4 on 11/11, 8.7 on 11/21).  INR 1.4.  Alk phos 135, normal transaminases, t bili.     Current medical management consists of Reglan 5 mg IV every 12 hours.  Erythromycin 250 mg p.o. 3 times daily.  Phenergan 12.5 mg IV every 6 hours was initiated early this morning.    Past Medical History:  Diagnosis Date  . Angina   . ASCVD (arteriosclerotic cardiovascular disease)    , Anterior infarction 2005, LAD diagonal bifurcation intervention 03/2004  . Automatic implantable cardiac defibrillator -St. Jude's       . Benign neoplasm of colon   . CHF (congestive heart failure) ([place])   . Chronic systolic heart failure ([place])   . Coronary artery disease     Widely patent previously placed stents in the left anterior   . Crohn's disease ([place])   . 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 Dyspnea   . Gastroparesis   . GERD (gastroesophageal reflux disease)   . High cholesterol   . Hyperlipidemia   . Hypersomnolent    Previous diagnosis of narcolepsy  . Hypertension, essential   . Ischemic cardiomyopathy    Ejection fraction 15-20% catheterization 2010  . Type II or unspecified type diabetes mellitus without mention of complication, not stated as uncontrolled   . Unspecified gastritis and gastroduodenitis without mention of hemorrhage     Past Surgical History:  Procedure Laterality Date  . APPENDECTOMY    . AV FISTULA PLACEMENT Right 06/26/2017   Procedure: ARTERIOVENOUS (AV) FISTULA CREATION VERSUS GRAFT RIGHT  ARM;  Surgeon: Conrad Fouke, MD;  Location: [facility];  Service: Vascular;  Laterality: Right;  . CARDIAC DEFIBRILLATOR PLACEMENT  2010   St. Jude ICD  . COLECTOMY  ~ 2003   "for Crohn's" ascending colon.    . CORONARY STENT INTERVENTION N/A 11/25/2016   Procedure: Coronary Stent Intervention;  Surgeon: Leonie Man, MD;  Location: [facility];  Service: Cardiovascular;  Laterality: N/A;  . EP IMPLANTABLE DEVICE N/A 09/14/2016   Procedure: ICD Generator Changeout;  Surgeon: Deboraha Sprang, MD;  Location: [facility];  Service: Cardiovascular;  Laterality: N/A;  . ESOPHAGOGASTRODUODENOSCOPY N/A 07/12/2017   Procedure: ESOPHAGOGASTRODUODENOSCOPY (EGD);  Surgeon: Jerene Bears, MD;  Location:  [facility];  Service: Gastroenterology;  Laterality: N/A;  VAD pt so VAD team will need to accompany pt.   [name] FETAL SURGERY FOR CONGENITAL HERNIA     ????? pt has no knowledge of this..    . INGUINAL HERNIA REPAIR    . INSERTION OF DIALYSIS CATHETER Right 06/26/2017   Procedure: INSERTION OF TUNNELED  DIALYSIS CATHETER RIGHT INTERNAL JUGULAR;  Surgeon: Conrad [place], MD;  Location: [facility];  Service: Vascular;  Laterality: Right;  . INSERTION OF IMPLANTABLE LEFT VENTRICULAR ASSIST DEVICE N/A 06/12/2017   Procedure: INSERTION OF IMPLANTABLE LEFT VENTRICULAR ASSIST [place] 3;  Surgeon: Ivin Poot, MD;  Location: [facility];  Service: Open Heart Surgery;  Laterality: N/A;  HM3 LVAD  CIRC ARREST  NITRIC OXIDE  . IR THORACENTESIS ASP PLEURAL SPACE W/IMG GUIDE  06/28/2017  . MULTIPLE EXTRACTIONS WITH ALVEOLOPLASTY N/A 06/08/2017   Procedure: MULTIPLE EXTRACTION WITH ALVEOLOPLASTY AND PRE PROSTHETIC SURGERY AS NEEDED;  Surgeon: Lenn Cal, DDS;  Location: [facility];  Service: Oral Surgery;  Laterality: N/A;  . RIGHT HEART CATH N/A 06/07/2017   Procedure: RIGHT HEART CATH;  Surgeon: Larey Dresser, MD;  Location: [facility];  Service: Cardiovascular;  Laterality: N/A;  . RIGHT/LEFT HEART CATH AND CORONARY ANGIOGRAPHY N/A  11/23/2016   Procedure: Right/Left Heart Cath and Coronary Angiography;  Surgeon: Larey Dresser, MD;  Location: [facility];  Service: Cardiovascular;  Laterality: N/A;  . TEE WITHOUT CARDIOVERSION N/A 06/12/2017   Procedure: TRANSESOPHAGEAL ECHOCARDIOGRAM (TEE);  Surgeon: Prescott Gum, Collier Salina, MD;  Location: [facility];  Service: Open Heart Surgery;  Laterality: N/A;    Prior to Admission medications   Medication Sig Start Date End Date Taking? Authorizing Provider  amiodarone (PACERONE) 200 MG tablet Take 1 tablet (200 mg total) by mouth daily. 07/03/17  Yes Clegg, Amy D, NP  amLODipine (NORVASC) 5 MG tablet Take 1 tablet (87m) on Tuesday, Thursday, Saturday and Sunday (non HD days) by mouth 07/18/17  Yes MLarey Dresser MD  aspirin EC 81 MG tablet Take 1 tablet (81 mg total) by mouth daily. 07/26/17  Yes MLarey Dresser MD  atorvastatin (LIPITOR) 40 MG tablet Take 1 tablet (40 mg total) daily at 6 PM by mouth. 07/16/17  Yes Bhagat, Bhavinkumar, PA  hydrALAZINE (APRESOLINE) 25 MG tablet Take 37.5 mg three times a day on non hemodialysis days and in the evening on dialysis days. Patient taking differently: 12.5 mg. Take 12.5 mg three times a day on non hemodialysis days and in the evening on dialysis days. 07/16/17  Yes Bhagat, Bhavinkumar, PA  insulin aspart (NOVOLOG) 100 UNIT/ML injection Inject 3 Units into the skin 3 (three) times daily with meals. 07/03/17  Yes Clegg, Amy D, NP  insulin glargine (LANTUS) 100 UNIT/ML injection Inject 0.05 mLs (5 Units total) into the skin daily. 07/03/17  Yes Clegg, Amy D, NP  metoCLOPramide (REGLAN) 5 MG tablet Take 1 tablet (5 mg total) 2 (two) times daily before lunch and supper by mouth. 07/18/17  Yes MLarey Dresser MD  nitroGLYCERIN (NITROSTAT) 0.4 MG SL tablet Place 1 tablet (0.4 mg total) under the tongue every 5 (five) minutes as needed for chest pain. 07/13/16  Yes SJerline Pain MD  ondansetron (ZOFRAN ODT) 4 MG disintegrating tablet Take 1  tablet (4 mg total) by mouth every 6 (six) hours as needed for nausea or vomiting. 01/10/17  Yes Esterwood, Amy S, PA-C  pantoprazole (PROTONIX) 40 MG tablet Take  40 mg by mouth daily.   Yes [provider]  warfarin (COUMADIN) 5 MG tablet Take 5 mg by mouth at bedtime. Take 5 mg (1 tablet) daily except/ 7.5 mg (1 and 1/2 tablets) on Tues/Thurs.   Yes [provider]  docusate sodium (COLACE) 100 MG capsule Take 2 capsules (200 mg total) by mouth daily. Patient not taking: Reported on 07/26/2017 07/03/17   Darrick Grinder D, NP  erythromycin (E-MYCIN) 250 MG tablet Take 1 tablet (250 mg total) 3 (three) times daily by mouth. Patient not taking: Reported on 07/31/2017 07/16/17   Leanor Kail, PA  sildenafil (REVATIO) 20 MG tablet Take 1 tablet (20 mg total) by mouth 3 (three) times daily. Patient not taking: Reported on 07/31/2017 07/26/17   Larey Dresser, MD    Scheduled Meds: . amiodarone  200 mg Oral Daily  . aspirin EC  81 mg Oral Daily  . erythromycin  250 mg Oral TID  . insulin aspart  0-9 Units Subcutaneous TID WC  . metoCLOPramide (REGLAN) injection  5 mg Intravenous Q12H  . pantoprazole (PROTONIX) IV  40 mg Intravenous Q24H  . promethazine  12.5 mg Intravenous Q6H  . sodium chloride flush  3 mL Intravenous Q12H  . warfarin  7.5 mg Oral ONCE-1800  . Warfarin - Pharmacist Dosing Inpatient   Does not apply q1800   Infusions: . sodium chloride    . heparin 1,200 Units/hr (07/31/17 0455)   PRN Meds: sodium chloride, acetaminophen, hydrALAZINE, ondansetron (ZOFRAN) IV, sodium chloride flush   Allergies as of 07/31/2017 - Review Complete 07/31/2017  Allergen Reaction Noted  . Metformin and related Diarrhea 06/03/2013    Family History  Problem Relation Age of Onset  . Colon polyps Mother   . Diabetes Mother   . Heart attack Father   . Diabetes Father   . Stroke Paternal Grandfather   . Colitis Sister   . Heart disease Brother   . Colitis Sister   .  Colon cancer Neg Hx     Social History   Socioeconomic History  . Marital status: Married    Spouse name: Not on file  . Number of children: Not on file  . Years of education: Not on file  . Highest education level: Not on file  Social Needs  . Financial resource strain: Not on file  . Food insecurity - worry: Not on file  . Food insecurity - inability: Not on file  . Transportation needs - medical: Not on file  . Transportation needs - non-medical: Not on file  Occupational History  . Not on file  Tobacco Use  . Smoking status: Never Smoker  . Smokeless tobacco: Never Used  Substance and Sexual Activity  . Alcohol use: No  . Drug use: No  . Sexual activity: No  Other Topics Concern  . Not on file  Social History Narrative  . Not on file    REVIEW OF SYSTEMS: Constitutional: With the nausea and vomiting, he is feeling weak and malaise. ENT:  No nose bleeds Pulm: Trouble breathing.  No productive cough. CV:  No palpitations, no LE edema.  Chest pain GU: Oliguric.  Urine tends to be very concentrated amber color. GI:  Per HPI Heme: No unusual or excessive bleeding or bruising. Transfusions:  Per HPI Neuro:  No headaches, no peripheral tingling or numbness Derm:  No itching, no rash or sores.  Endocrine:  No sweats or chills.  No polyuria or dysuria Immunization: Reviewed.  Signs  of vaccination given 06/06/17. Travel:  None beyond local counties in last few months.    PHYSICAL EXAM: Vital signs in last 24 hours: Vitals:   07/31/17 1204 07/31/17 1252  BP:  (!) 150/107  Pulse:    Resp:    Temp: 98 F (36.7 C)   SpO2:     Wt Readings from Last 3 Encounters:  07/31/17 82.5 kg (181 lb 14.1 oz)  07/26/17 83.1 kg (183 lb 3.2 oz)  07/16/17 78.6 kg (173 lb 4.5 oz)    General: Pleasant, chronically and somewhat acutely ill-appearing AAM. Head: No facial asymmetry or swelling Eyes: No scleral icterus, no conjunctival pallor.  EOMI Ears: Not hard of hearing Nose:  No congestion or discharge. Mouth: Moist, clear, pink oral mucosa.  Tongue midline. Neck: No JVD, no masses, no thyromegaly. Lungs: Clear.  No labored breathing no cough. Heart: VAD hum with regularly irregular underlying heartbeats.  Pacemaker on  Abdomen: Soft.  Not distended or tender.  Bowel sounds hypoactive but not tinkling or high-pitched.  Several scars consistent with his surgery.  VAD driveline site enters abdomen in the left upper quadrant,  site is benign..   Rectal: Deferred Musc/Skeltl: No joint swelling, erythema or gross deformity. Extremities: No CCE.  Feet are warm. Neurologic: Alert.  Oriented x3.  Moves all 4 limbs.  No tremor, no growth weakness or strength deficits. Skin: No suspicious sores, or significant bruising. Nodes: No cervical adenopathy. Psych: Flat affect consistent with illness.  Cooperative.  Good historian with fluid speech.  Intake/Output from previous day: 11/25 0701 - 11/26 0700 In: 1000 [IV Piggyback:1000] Out: -  Intake/Output this shift: No intake/output data recorded.  LAB RESULTS: Recent Labs    07/31/17 0035 07/31/17 0405  WBC 8.7 11.0*  HGB 9.4* 8.4*  HCT 29.5* 26.3*  PLT 288 278   BMET Lab Results  Component Value Date   NA 136 07/31/2017   NA 136 07/31/2017   NA 131 (L) 07/26/2017   K 4.6 07/31/2017   K 4.2 07/31/2017   K 4.5 07/26/2017   CL 102 07/31/2017   CL 100 (L) 07/31/2017   CL 96 (L) 07/26/2017   CO2 18 (L) 07/31/2017   CO2 24 07/31/2017   CO2 24 07/26/2017   GLUCOSE 152 (H) 07/31/2017   GLUCOSE 116 (H) 07/31/2017   GLUCOSE 229 (H) 07/26/2017   BUN 48 (H) 07/31/2017   BUN 47 (H) 07/31/2017   BUN 22 (H) 07/26/2017   CREATININE 7.33 (H) 07/31/2017   CREATININE 7.39 (H) 07/31/2017   CREATININE 4.56 (H) 07/26/2017   CALCIUM 8.6 (L) 07/31/2017   CALCIUM 8.9 07/31/2017   CALCIUM 8.3 (L) 07/26/2017   LFT Recent Labs    07/31/17 0035  PROT 7.0  ALBUMIN 2.8*  AST 16  ALT 15*  ALKPHOS 130*  BILITOT 0.8     PT/INR Lab Results  Component Value Date   INR 1.49 07/31/2017   INR 2.23 07/26/2017   INR 1.91 07/18/2017   Hepatitis Panel No results for input(s): HEPBSAG, HCVAB, HEPAIGM, HEPBIGM in the last 72 hours. C-Diff No components found for: CDIFF Lipase     Component Value Date/Time   LIPASE 17 04/07/2017 0500    Drugs of Abuse     Component Value Date/Time   LABOPIA NONE DETECTED 04/05/2017 1847   COCAINSCRNUR NONE DETECTED 04/05/2017 1847   LABBENZ NONE DETECTED 04/05/2017 1847   AMPHETMU NONE DETECTED 04/05/2017 1847   THCU NONE DETECTED 04/05/2017 1847   LABBARB  NONE DETECTED 04/05/2017 1847     RADIOLOGY STUDIES: Dg Abd Acute W/chest  Result Date: 07/31/2017 CLINICAL DATA:  Vomiting EXAM: DG ABDOMEN ACUTE W/ 1V CHEST COMPARISON:  07/13/2017, CT 07/11/2017 cava radiograph 06/18/2017 FINDINGS: Single-view chest demonstrates post sternotomy changes. Left-sided pacing device as before. Right-sided central venous catheter tips overlie the right atrium. LVAD similar in position. Cardiomegaly. No pneumothorax. Partial atelectasis or consolidation at the left base, not significantly changed. Supine and upright views of the abdomen demonstrate no free air beneath the diaphragm. Non obstructed bowel gas pattern with moderate stool. No abnormal calcification. IMPRESSION: 1. Low lung volumes with small left pleural effusion and partial consolidation at the left lung base cough blood not much interval change since 07/13/2017. Cardiomegaly. 2. Nonobstructed bowel-gas pattern Electronically Signed   By: Donavan Foil M.D.   On: 07/31/2017 02:59     IMPRESSION:   *  Flare of gastroparesis.    *  Ischemic CM.  S/p LVAD 06/12/17  *  ESRD, became dialysis dependent post LVAD.    *  Anemia.  Borderline microcytic.    *  IDDM.    *  Elevated alk phos, normal transaminases.  Not unexpected/surprising in ESRD dialysis pt.     PLAN:     *  Supportive care as doing.  Since the  symptoms are a little bit better today, continue current medications.  Continue to offer full liquids.     Azucena Freed  07/31/2017, 1:05 PM Pager: [phone]    Attending physician's note   I have taken a history, examined the patient and reviewed the chart. I agree with the Advanced Practitioner's note, impression and recommendations.  Gastroparesis with symptomatic flare which is improving.  * Full liquids for now. Advance slowly as tolerated.  * Optimize GI medications as follows: Metoclopramide 10 mg qid, ac and hs, IV for now and change to PO as symptoms improve Erythromycin 250 mg po tid, ac Pantoprazole 40 mg po qam Zofran 4 mg q6h prn, IV for now and change to PO as symptoms improve  Lucio Edward, MD [facility] [phone] Mon-Fri 8a-5p [phone] after 5p, weekends, holidays

## 2017-08-01 DIAGNOSIS — R630 Anorexia: Secondary | ICD-10-CM

## 2017-08-01 DIAGNOSIS — Z7901 Long term (current) use of anticoagulants: Secondary | ICD-10-CM

## 2017-08-01 LAB — PROTIME-INR
INR: 1.63
PROTHROMBIN TIME: 19.2 s — AB (ref 11.4–15.2)

## 2017-08-01 LAB — GLUCOSE, CAPILLARY
GLUCOSE-CAPILLARY: 93 mg/dL (ref 65–99)
Glucose-Capillary: 104 mg/dL — ABNORMAL HIGH (ref 65–99)

## 2017-08-01 LAB — IRON AND TIBC
Iron: 57 ug/dL (ref 45–182)
SATURATION RATIOS: 24 % (ref 17.9–39.5)
TIBC: 237 ug/dL — AB (ref 250–450)
UIBC: 180 ug/dL

## 2017-08-01 LAB — BASIC METABOLIC PANEL
ANION GAP: 15 (ref 5–15)
BUN: 61 mg/dL — AB (ref 6–20)
CHLORIDE: 103 mmol/L (ref 101–111)
CO2: 19 mmol/L — ABNORMAL LOW (ref 22–32)
Calcium: 8.8 mg/dL — ABNORMAL LOW (ref 8.9–10.3)
Creatinine, Ser: 8.71 mg/dL — ABNORMAL HIGH (ref 0.61–1.24)
GFR calc non Af Amer: 6 mL/min — ABNORMAL LOW (ref >60)
GFR, EST AFRICAN AMERICAN: 7 mL/min — AB (ref >60)
Glucose, Bld: 141 mg/dL — ABNORMAL HIGH (ref 65–99)
POTASSIUM: 5.2 mmol/L — AB (ref 3.5–5.1)
SODIUM: 137 mmol/L (ref 135–145)

## 2017-08-01 LAB — CBC
HEMATOCRIT: 24.4 % — AB (ref 39.0–52.0)
HEMOGLOBIN: 7.8 g/dL — AB (ref 13.0–17.0)
MCH: 25.1 pg — ABNORMAL LOW (ref 26.0–34.0)
MCHC: 32 g/dL (ref 30.0–36.0)
MCV: 78.5 fL (ref 78.0–100.0)
Platelets: 288 10*3/uL (ref 150–400)
RBC: 3.11 MIL/uL — AB (ref 4.22–5.81)
RDW: 17.6 % — ABNORMAL HIGH (ref 11.5–15.5)
WBC: 10.4 10*3/uL (ref 4.0–10.5)

## 2017-08-01 LAB — FERRITIN: FERRITIN: 241 ng/mL (ref 24–336)

## 2017-08-01 LAB — LACTATE DEHYDROGENASE: LDH: 285 U/L — ABNORMAL HIGH (ref 98–192)

## 2017-08-01 LAB — HEPARIN LEVEL (UNFRACTIONATED): Heparin Unfractionated: 0.37 IU/mL (ref 0.30–0.70)

## 2017-08-01 MED ORDER — ALTEPLASE 2 MG IJ SOLR: 2.0000 mg | Freq: Once | INTRAMUSCULAR | Status: DC | PRN

## 2017-08-01 MED ORDER — LORAZEPAM 0.5 MG PO TABS
0.5000 mg | ORAL_TABLET | Freq: Two times a day (BID) | ORAL | Status: DC | PRN
  Administered 2017-08-01 – 2017-08-02 (×2): 0.5 mg via ORAL
  Filled 2017-08-01 (×2): qty 1

## 2017-08-01 MED ORDER — SODIUM CHLORIDE 0.9 % IV SOLN: 100.0000 mL | INTRAVENOUS | Status: DC | PRN

## 2017-08-01 MED ORDER — AMLODIPINE BESYLATE 5 MG PO TABS
5.0000 mg | ORAL_TABLET | Freq: Every day | ORAL | Status: DC
  Administered 2017-08-01 – 2017-08-04 (×4): 5 mg via ORAL
  Filled 2017-08-01 (×4): qty 1

## 2017-08-01 MED ORDER — HYDRALAZINE HCL 25 MG PO TABS
12.5000 mg | ORAL_TABLET | Freq: Three times a day (TID) | ORAL | Status: DC
  Administered 2017-08-01 – 2017-08-02 (×3): 12.5 mg via ORAL
  Filled 2017-08-01 (×3): qty 0.5

## 2017-08-01 MED ORDER — LIDOCAINE-PRILOCAINE 2.5-2.5 % EX CREA: 1.0000 "application " | TOPICAL_CREAM | CUTANEOUS | Status: DC | PRN

## 2017-08-01 MED ORDER — PENTAFLUOROPROP-TETRAFLUOROETH EX AERO: 1.0000 "application " | INHALATION_SPRAY | CUTANEOUS | Status: DC | PRN

## 2017-08-01 MED ORDER — WARFARIN SODIUM 7.5 MG PO TABS: 7.5000 mg | ORAL_TABLET | Freq: Once | ORAL | Status: DC

## 2017-08-01 MED ORDER — PANTOPRAZOLE SODIUM 40 MG PO TBEC
40.0000 mg | DELAYED_RELEASE_TABLET | Freq: Every day | ORAL | Status: DC
  Administered 2017-08-02 – 2017-08-03 (×2): 40 mg via ORAL
  Filled 2017-08-01 (×2): qty 1

## 2017-08-01 MED ORDER — METOCLOPRAMIDE HCL 5 MG/ML IJ SOLN
5.0000 mg | Freq: Three times a day (TID) | INTRAMUSCULAR | Status: DC
  Administered 2017-08-01 – 2017-08-02 (×3): 5 mg via INTRAVENOUS
  Filled 2017-08-01 (×3): qty 2

## 2017-08-01 MED ORDER — SILDENAFIL CITRATE 20 MG PO TABS
20.0000 mg | ORAL_TABLET | Freq: Three times a day (TID) | ORAL | Status: DC
  Administered 2017-08-01 – 2017-08-03 (×9): 20 mg via ORAL
  Filled 2017-08-01 (×9): qty 1

## 2017-08-01 MED ORDER — LIDOCAINE HCL (PF) 1 % IJ SOLN: 5.0000 mL | INTRAMUSCULAR | Status: DC | PRN

## 2017-08-01 MED ORDER — WARFARIN SODIUM 7.5 MG PO TABS
7.5000 mg | ORAL_TABLET | Freq: Once | ORAL | Status: AC
  Administered 2017-08-01: 7.5 mg via ORAL
  Filled 2017-08-01: qty 1

## 2017-08-01 MED ORDER — HEPARIN SODIUM (PORCINE) 1000 UNIT/ML DIALYSIS: 1000.0000 [IU] | INTRAMUSCULAR | Status: DC | PRN

## 2017-08-01 NOTE — Progress Notes (Signed)
LVAD Coordinator Rounding Note:  Admitted 07/31/17 for intractable nausea/vomiting due to gastroparesis.  HM3LVAD implanted on 10/8/2018by Dr Elpidio [name] Destination Therapy criteria due to renal insufficiency.  Pt states that he is still feeling nauseous at times, the nurse just gave pt phenergan and some reglan. Nurse is trying to get PO meds in pt at this time. Pts doppler BP is 136. Nurse was instructed that if PO meds cannot be tolerated at this time to please give the prn IV hydralazine that is ordered.   Vital signs: Temp: 98.1 HR:  96 Doppler: 136 - correlating as modified systolic Auto cuff: 824/175 (114) O2 Sat: 95 % on RA  Wt: 181>180 lbs   LVAD interrogation reveals:  Speed: 5600 Flow:  3.7 Power: 4.4w PI: 6.2 Alarms: none Events: rare Hematocrit: 24 Fixed speed: 5600 Low speed limit: 5300  Back up black bag with controller, batteries, and extra clips at bedside.    Drive Line: Daily. Advanced to weekly dressings. Due Wednesday 08/02/17.  Labs:  LDH trend: 216>285  INR trend: 1.49>1.63  Hgb trend: 9.4>7.8  Anticoagulation Plan: -INR Goal: 2-3 -ASA Dose: 81 mg  Gtts: Heparin 1200 units/hr  Device: -St Jude dual ICD -Therapies: ON at 200 bpm  Renal:  Creat trend: 7.39>8.71 HD - MWF  Plan/Recommendations:  1.  Call VAD pager for any VAD equipment issues.   [name] RN, VAD Coordinator 24/7 pager [phone]

## 2017-08-01 NOTE — Care Management Note (Signed)
Case Management Note  Patient Details  Name: AUSAR GEORGIOU MRN: [id number] Date of Birth: 03-Nov-1965  Subjective/Objective:     Pt is a readmit with vomiting and nausea - pt is a recent LVAD recipient               Action/Plan:   PTA independent from home with family.  Pt is active with [facility] per HF order set.  Pt is ESRD on outpt HD.  CM will continue to follow for discharge needs   Expected Discharge Date:                  Expected Discharge Plan:  [place]  In-House Referral:     Discharge planning Services  CM Consult  Post Acute Care Choice:    Choice offered to:     DME Arranged:    DME Agency:     HH Arranged:    HH Agency:     Status of Service:     If discussed at [employer], dates discussed:    Additional Comments:  Maryclare Labrador, RN 08/01/2017, 2:39 PM

## 2017-08-01 NOTE — Progress Notes (Signed)
Daily Rounding Note  08/01/2017, 1:21 PM  LOS: 1 day   SUBJECTIVE:   Chief complaint:  Nausea, vomiting.  Overall some moderate improvement.  Not eating, not hungry.  Tolerating sips of clears.  Still mild nausea but no vomiting/heaving.   Last BM yesterday,  Moderate volume and brown.    OBJECTIVE:         Vital signs in last 24 hours:    Temp:  [97.9 F (36.6 C)-98.7 F (37.1 C)] 98.1 F (36.7 C) (11/27 1155) Pulse Rate:  [86-96] 96 (11/27 1155) Resp:  [14-24] 18 (11/27 1125) BP: (106-160)/(92-117) 139/106 (11/27 1155) SpO2:  [95 %-100 %] 100 % (11/27 0422) Weight:  [81.7 kg (180 lb 1.9 oz)-84.1 kg (185 lb 6.5 oz)] 81.7 kg (180 lb 1.9 oz) (11/27 1125) Last BM Date: 07/31/17 Filed Weights   08/01/17 0422 08/01/17 0744 08/01/17 1125  Weight: 83 kg (182 lb 14.4 oz) 84.1 kg (185 lb 6.5 oz) 81.7 kg (180 lb 1.9 oz)   General: looks chronically unwell.  Comfortable.     Heart: VAD hum and Pumping activity audible Chest: clear bil.  No SOB Abdomen: soft, NT.  Active BS.  VAD driveline site on left benign Extremities: no CCE Neuro/Psych:  Oriented x 3.  No tremor, no asterixis, no lip smacking.    Intake/Output from previous day: 11/26 0701 - 11/27 0700 In: 527 [P.O.:240; I.V.:287] Out: -   Intake/Output this shift: Total I/O In: -  Out: 1000 [Other:1000]  Lab Results: Recent Labs    07/31/17 0035 07/31/17 0405 08/01/17 0219  WBC 8.7 11.0* 10.4  HGB 9.4* 8.4* 7.8*  HCT 29.5* 26.3* 24.4*  PLT 288 278 288   BMET Recent Labs    07/31/17 0035 07/31/17 0405 08/01/17 0219  NA 136 136 137  K 4.2 4.6 5.2*  CL 100* 102 103  CO2 24 18* 19*  GLUCOSE 116* 152* 141*  BUN 47* 48* 61*  CREATININE 7.39* 7.33* 8.71*  CALCIUM 8.9 8.6* 8.8*   LFT Recent Labs    07/31/17 0035  PROT 7.0  ALBUMIN 2.8*  AST 16  ALT 15*  ALKPHOS 130*  BILITOT 0.8   PT/INR Recent Labs    07/31/17 0035 08/01/17 0219    LABPROT 17.9* 19.2*  INR 1.49 1.63   Hepatitis Panel No results for input(s): HEPBSAG, HCVAB, HEPAIGM, HEPBIGM in the last 72 hours.  Studies/Results: Dg Abd Acute W/chest  Result Date: 07/31/2017 CLINICAL DATA:  Vomiting EXAM: DG ABDOMEN ACUTE W/ 1V CHEST COMPARISON:  07/13/2017, CT 07/11/2017 cava radiograph 06/18/2017 FINDINGS: Single-view chest demonstrates post sternotomy changes. Left-sided pacing device as before. Right-sided central venous catheter tips overlie the right atrium. LVAD similar in position. Cardiomegaly. No pneumothorax. Partial atelectasis or consolidation at the left base, not significantly changed. Supine and upright views of the abdomen demonstrate no free air beneath the diaphragm. Non obstructed bowel gas pattern with moderate stool. No abnormal calcification. IMPRESSION: 1. Low lung volumes with small left pleural effusion and partial consolidation at the left lung base cough blood not much interval change since 07/13/2017. Cardiomegaly. 2. Nonobstructed bowel-gas pattern Electronically Signed   By: Donavan Foil M.D.   On: 07/31/2017 02:59   Scheduled Meds: . amiodarone  200 mg Oral Daily  . amLODipine  5 mg Oral Daily  . aspirin EC  81 mg Oral Daily  . erythromycin  250 mg Oral TID AC  . hydrALAZINE  12.5 mg  Oral Q8H  . insulin aspart  0-9 Units Subcutaneous TID WC  . metoCLOPramide (REGLAN) injection  10 mg Intravenous TID AC & HS  . pantoprazole (PROTONIX) IV  40 mg Intravenous Q24H  . promethazine  12.5 mg Intravenous Q6H  . sildenafil  20 mg Oral TID  . sodium chloride flush  3 mL Intravenous Q12H  . warfarin  7.5 mg Oral ONCE-1800  . Warfarin - Pharmacist Dosing Inpatient   Does not apply q1800   Continuous Infusions: . sodium chloride    . heparin 1,200 Units/hr (08/01/17 0400)   PRN Meds:.sodium chloride, acetaminophen, hydrALAZINE, ondansetron (ZOFRAN) IV, sodium chloride flush   ASSESMENT:   *  Acute flare diabetic gastroparesis in LVAD,  Coumadin pt.   sxws improved but persistent anorexia and lesser nausea and inadequate voluntary po intake.  .    *  Anemia, acute on chronic.  Not iron deficient.    *  Chronic Coumadin, subtherapeutic INR.  Heparin in place.  Pt declined Coumadin yesterday due to fear of not being able to keep it down.    *  ESRD.  Went to HD this morning.    *  2003 ascending colectomy/appendectomy/diverting colostomy 2003 for gangrenous appendicitis, ileostomy reversed 2003.     PLAN   *  Switch to daily PO Protonix.  Continue the scheduled Phenergan 12.5 mg IV q 6 hours.  Continue the E-mycin 250 mg po TID AC.  Note Cardiology is wondering how long a course of E-Mycin?   Lower dose of Reglan to 5 mg IV TID [facility] /HS as he is ESRD pt on HD.  Continue to offer full liquid diet.    Azucena Freed  08/01/2017, 1:21 PM Pager: [phone]     Attending physician's note   I have taken an interval history, reviewed the chart and examined the patient. I agree with the Advanced Practitioner's note, impression and recommendations. Continue Emycin, Reglan, Phenergan scheduled dosing. To adequately manage his gastroparesis he should stay on Emycin for at least 1 week after discharge with symptoms controlled. Can add Ativan for nausea mgmt if he does not improve by tomorrow. Remain on full liquids.   Lucio Edward, MD [facility] [phone] Mon-Fri 8a-5p [phone] after 5p, weekends, holidays

## 2017-08-01 NOTE — Progress Notes (Signed)
[facility] Progress Note  Subjective: no new c/o, on HD  Vitals:   08/01/17 1000 08/01/17 1030 08/01/17 1125 08/01/17 1155  BP: (!) 149/105 (!) 144/108 (!) 148/96 (!) 139/106  Pulse: 90 91 87 96  Resp:   18   Temp:   98.7 F (37.1 C) 98.1 F (36.7 C)  TempSrc:   Oral Oral  SpO2:      Weight:   81.7 kg (180 lb 1.9 oz)   Height:        Inpatient medications: . amiodarone  200 mg Oral Daily  . amLODipine  5 mg Oral Daily  . aspirin EC  81 mg Oral Daily  . erythromycin  250 mg Oral TID AC  . hydrALAZINE  12.5 mg Oral Q8H  . insulin aspart  0-9 Units Subcutaneous TID WC  . metoCLOPramide (REGLAN) injection  10 mg Intravenous TID AC & HS  . pantoprazole (PROTONIX) IV  40 mg Intravenous Q24H  . promethazine  12.5 mg Intravenous Q6H  . sildenafil  20 mg Oral TID  . sodium chloride flush  3 mL Intravenous Q12H  . warfarin  7.5 mg Oral ONCE-1800  . Warfarin - Pharmacist Dosing Inpatient   Does not apply q1800   . sodium chloride    . heparin 1,200 Units/hr (08/01/17 0400)   sodium chloride, acetaminophen, hydrALAZINE, ondansetron (ZOFRAN) IV, sodium chloride flush  Exam: Gen alert, no distress No jvd or bruits Chest clear bilat RRR no MRG Abd soft ntnd , driveline in L mid abd for lvad Ext no LE edema Neuro alert, Ox 3 , nf  R IJ TDC    Dialysis: MWF [facility] 4h 30mn  81.5kg  3K/2Ca bath  Hep 8000 bolus   R IJ TDC - Venofer 1087mx 10 ordered (thru 08/18/17 ) - Mircera 10038mIV q 2 weeks ordered (last 11/20 at OP unit) - Calcitriol 0.5mc6mO q HD      Impression: 1. Nausea/ vomiting - hx gastroparesis, per primary team 2. ESRD on HD MWF.  3. ICM/ chron syst CHF/ sp ICD/ sp LVAD - on IV heparin, per cards 4. Anemia of CKD - Hb 8- 10 range. Next esa due on 12/4. Cont IV fe load.  5. MBD of CKD - cont vdra/ binders 6. Vol - no vol excess, at dry wt today after HD 7. CAD - not active 8. HTN - on norvasc/ hydral at home, on hold here. Per  cards 9. DM 10. Hx DVT/ factor V hetero - on coumadin at home   Plan - HD tomorrow to get back on sched   Rob Kelly SplinterCaroPortaler [phone]/27/2018, 12:06 PM   Recent Labs  Lab 07/31/17 0035 07/31/17 0405 08/01/17 0219  NA 136 136 137  K 4.2 4.6 5.2*  CL 100* 102 103  CO2 24 18* 19*  GLUCOSE 116* 152* 141*  BUN 47* 48* 61*  CREATININE 7.39* 7.33* 8.71*  CALCIUM 8.9 8.6* 8.8*   Recent Labs  Lab 07/31/17 0035  AST 16  ALT 15*  ALKPHOS 130*  BILITOT 0.8  PROT 7.0  ALBUMIN 2.8*   Recent Labs  Lab 07/31/17 0035 07/31/17 0405 08/01/17 0219  WBC 8.7 11.0* 10.4  NEUTROABS 7.3  --   --   HGB 9.4* 8.4* 7.8*  HCT 29.5* 26.3* 24.4*  MCV 79.1 79.7 78.5  PLT 288 278 288   Iron/TIBC/Ferritin/ %Sat    Component Value Date/Time   IRON  57 08/01/2017 0219   TIBC 237 (L) 08/01/2017 0219   FERRITIN 241 08/01/2017 0219   IRONPCTSAT 24 08/01/2017 1828

## 2017-08-01 NOTE — Progress Notes (Signed)
Advanced Heart Failure VAD Team Note  Subjective:    Patient is feeling better, less nauseated.  Able to take in some liquids last night. He was seen by GI, Reglan increased and erythromycin added.   MAP elevated, has not been getting BP meds.   Hemoglobin 7.8 today.  He denies BRBPR or melena.    He has been tolerating HD without problems.   LVAD INTERROGATION:  HeartMate II LVAD:  Flow 3.5 liters/min, speed 5600, power 4.3, PI 7.3.    Objective:    Vital Signs:   Temp:  [97.9 F (36.6 C)-98.3 F (36.8 C)] 97.9 F (36.6 C) (11/27 0744) Pulse Rate:  [86-91] 90 (11/27 0900) Resp:  [14-24] 19 (11/27 0900) BP: (106-160)/(94-117) 160/109 (11/27 0900) SpO2:  [95 %-100 %] 100 % (11/27 0422) Weight:  [182 lb 14.4 oz (83 kg)-185 lb 6.5 oz (84.1 kg)] 185 lb 6.5 oz (84.1 kg) (11/27 0744) Last BM Date: 07/31/17 Mean arterial Pressure 100s-110s  Intake/Output:   Intake/Output Summary (Last 24 hours) at 08/01/2017 0937 Last data filed at 08/01/2017 0400 Gross per 24 hour  Intake 527 ml  Output -  Net 527 ml     Physical Exam    General:  Well appearing. No resp difficulty HEENT: normal Neck: supple. JVP 8 cm. Carotids 2+ bilat; no bruits. No lymphadenopathy or thyromegaly appreciated. Cor: Mechanical heart sounds with LVAD hum present. Lungs: clear Abdomen: soft, nontender, nondistended. No hepatosplenomegaly. No bruits or masses. Good bowel sounds. Driveline: C/D/I; securement device intact and driveline incorporated Extremities: no cyanosis, clubbing, rash, edema Neuro: alert & orientedx3, cranial nerves grossly intact. moves all 4 extremities w/o difficulty. Affect pleasant   Telemetry   NSR (personally reviewed)   Labs   Basic Metabolic Panel: Recent Labs  Lab 07/26/17 1115 07/31/17 0035 07/31/17 0405 08/01/17 0219  NA 131* 136 136 137  K 4.5 4.2 4.6 5.2*  CL 96* 100* 102 103  CO2 24 24 18* 19*  GLUCOSE 229* 116* 152* 141*  BUN 22* 47* 48* 61*    CREATININE 4.56* 7.39* 7.33* 8.71*  CALCIUM 8.3* 8.9 8.6* 8.8*    Liver Function Tests: Recent Labs  Lab 07/31/17 0035  AST 16  ALT 15*  ALKPHOS 130*  BILITOT 0.8  PROT 7.0  ALBUMIN 2.8*   No results for input(s): LIPASE, AMYLASE in the last 168 hours. No results for input(s): AMMONIA in the last 168 hours.  CBC: Recent Labs  Lab 07/26/17 1115 07/31/17 0035 07/31/17 0405 08/01/17 0219  WBC 8.9 8.7 11.0* 10.4  NEUTROABS  --  7.3  --   --   HGB 8.7* 9.4* 8.4* 7.8*  HCT 28.0* 29.5* 26.3* 24.4*  MCV 80.7 79.1 79.7 78.5  PLT 348 288 278 288    INR: Recent Labs  Lab 07/26/17 1115 07/31/17 0035 08/01/17 0219  INR 2.23 1.49 1.63    Other results:  EKG:    Imaging   Dg Abd Acute W/chest  Result Date: 07/31/2017 CLINICAL DATA:  Vomiting EXAM: DG ABDOMEN ACUTE W/ 1V CHEST COMPARISON:  07/13/2017, CT 07/11/2017 cava radiograph 06/18/2017 FINDINGS: Single-view chest demonstrates post sternotomy changes. Left-sided pacing device as before. Right-sided central venous catheter tips overlie the right atrium. LVAD similar in position. Cardiomegaly. No pneumothorax. Partial atelectasis or consolidation at the left base, not significantly changed. Supine and upright views of the abdomen demonstrate no free air beneath the diaphragm. Non obstructed bowel gas pattern with moderate stool. No abnormal calcification. IMPRESSION: 1. Low lung  volumes with small left pleural effusion and partial consolidation at the left lung base cough blood not much interval change since 07/13/2017. Cardiomegaly. 2. Nonobstructed bowel-gas pattern Electronically Signed   By: Donavan Foil M.D.   On: 07/31/2017 02:59      Medications:     Scheduled Medications: . amiodarone  200 mg Oral Daily  . amLODipine  5 mg Oral Daily  . aspirin EC  81 mg Oral Daily  . erythromycin  250 mg Oral TID AC  . hydrALAZINE  12.5 mg Oral Q8H  . insulin aspart  0-9 Units Subcutaneous TID WC  . metoCLOPramide  (REGLAN) injection  10 mg Intravenous TID AC & HS  . pantoprazole (PROTONIX) IV  40 mg Intravenous Q24H  . promethazine  12.5 mg Intravenous Q6H  . sildenafil  20 mg Oral TID  . sodium chloride flush  3 mL Intravenous Q12H  . warfarin  7.5 mg Oral ONCE-1800  . Warfarin - Pharmacist Dosing Inpatient   Does not apply q1800     Infusions: . sodium chloride    . sodium chloride    . sodium chloride    . heparin 1,200 Units/hr (08/01/17 0400)     PRN Medications:  sodium chloride, sodium chloride, sodium chloride, acetaminophen, alteplase, heparin, hydrALAZINE, lidocaine (PF), lidocaine-prilocaine, ondansetron (ZOFRAN) IV, pentafluoroprop-tetrafluoroeth, sodium chloride flush   Patient Profile   [name] is a 51 yo male with gout, DM2, CAD s/p anterior MI with severe systolic HF (EF 56%) s/p St Jude ICD & Heartmate 3 LVAD and ESRD being admitted for recurrent intractable N/V due to gastroparesis.  Assessment/Plan:    1. 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 He improved considerably with scheduled Reglan and erythromycin and was discharged on 07/16/17.  Symptoms have recurred.  Appreciate GI evaluation.  He is now feeling better, less nauseated and able to hold down liquids.  - Reglan increased to 10 mg tid.  - po erythromycin begun to promote motility, will need to discuss length of course with GI.  - Slowly advance diet.   - Continue Protonix and prn Zofran.  2. Chronic systolic CHF: Ischemic cardiomyopathy. St Jude ICD. NSTEMI in March 2018 with DES to LAD and RCA, complicated by cardiogenic shock, low output requiring milrinone. Echo 8/18 with EF 20-25%, moderate [name]. CPX (8/18) with severe functional impairment due to HF. He is now s/p HM3 LVAD placement.  MAP elevated. - Restart home BP regimen with amlodipine and hydralazine now that he should be able to hold down  pills.  - Current plan for ASA 81 and INR 2-3 (have been aiming higher with factor V Leiden heterozygosity).  Hemoglobin down though no overt bleeding.  I will lower INR goal down to 2-2.5.  3. ESRD: Developed peri-operatively after HM3 placement.  He is tolerating HD without problems currently.  4. CAD: NSTEMI in 3/18. LHC with 99% ulcerated lesion proximal RCA with left to right collaterals, 95% mid LAD stenosis after mid LAD stent. s/p PCI to RCA and LAD on 11/25/16. No exertional chest pain.  - Continue statin and ASA. 5. h/o DVTs: Factor V Leiden heterozygote.  On warfarin.  6. DM: Insulin sliding scale for now with poor po intake.  7. HTN: Restarting home med regimen as above.   8. PAD: Moderate bilateral disease on ABIs in 8/18.  Denies claudication currently. 9. Anemia: Hemoglobin down to 7.8.  No overt GI bleeding.  Fe studies ok.  - Renal disease likely plays a role, Aranesp per nephrology.  - Guaiac stool.  - Transfuse hgb < 7.   I reviewed the LVAD parameters from today, and compared the results to the patient's prior recorded data.  No programming changes were made.  The LVAD is functioning within specified parameters.  The patient performs LVAD self-test daily.  LVAD interrogation was negative for any significant power changes, alarms or PI events/speed drops.  LVAD equipment check completed and is in good working order.  Back-up equipment present.   LVAD education done on emergency procedures and precautions and reviewed exit site care.  Length of Stay: 1  Loralie Champagne, MD 08/01/2017, 9:37 AM  VAD Team --- VAD ISSUES ONLY--- Pager [phone] (7am - 7am)  Advanced Heart Failure Team  Pager [phone] (M-F; 7a - 4p)  Please contact [facility] for night-coverage after hours (4p -7a ) and weekends on [email or web address]

## 2017-08-01 NOTE — Progress Notes (Signed)
IV with heparin gtt infusing is coming out.  IV was secured the best possible and IV team paged.  After several attempts by two members of IV team, we were unable to get new access.  Dr. Joelyn Oms with Nephrology paged regarding possibility to stick arm with maturing AV fistula in order to have access for heparin gtt.  OK to do so as long as it is below the wrist.  IV team notified.

## 2017-08-01 NOTE — Progress Notes (Signed)
ANTICOAGULATION CONSULT NOTE - Initial Consult  Pharmacy Consult for heparin and warfarin Indication: LVAD, h/o DVT, and factor V leiden  Allergies  Allergen Reactions  . Metformin And Related Diarrhea    Patient Measurements: Height: 5' 8"  (172.7 cm) Weight: 185 lb 6.5 oz (84.1 kg) IBW/kg (Calculated) : 68.4  Vital Signs: Temp: 97.9 F (36.6 C) (11/27 0744) Temp Source: Oral (11/27 0744) BP: 144/108 (11/27 1030) Pulse Rate: 91 (11/27 1030)  Labs: Recent Labs    07/31/17 0035 07/31/17 0405 07/31/17 1235 08/01/17 0219  HGB 9.4* 8.4*  --  7.8*  HCT 29.5* 26.3*  --  24.4*  PLT 288 278  --  288  LABPROT 17.9*  --   --  19.2*  INR 1.49  --   --  1.63  HEPARINUNFRC  --   --  0.31 <0.10*  CREATININE 7.39* 7.33*  --  8.71*    Estimated Creatinine Clearance: 10.5 mL/min (A) (by C-G formula based on SCr of 8.71 mg/dL (H)).   Medical History: Past Medical History:  Diagnosis Date  . Angina   . ASCVD (arteriosclerotic cardiovascular disease)    , Anterior infarction 2005, LAD diagonal bifurcation intervention 03/2004  . Automatic implantable cardiac defibrillator -St. Jude's       . Benign neoplasm of colon   . CHF (congestive heart failure) ([place])   . Chronic systolic heart failure ([place])   . Coronary artery disease     Widely patent previously placed stents in the left anterior   . Crohn's disease ([place])   . 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 Dyspnea   . Gastroparesis   . GERD (gastroesophageal reflux disease)   . High cholesterol   . Hyperlipidemia   . Hypersomnolent    Previous diagnosis of narcolepsy  . Hypertension, essential   . Ischemic cardiomyopathy    Ejection fraction 15-20% catheterization 2010  . Type II or unspecified type diabetes mellitus without mention of complication, not stated as uncontrolled   . Unspecified gastritis and gastroduodenitis without mention of hemorrhage     Assessment: 51yo male c/o N/V, contacted LVAD  coordinator who asked pt to report to ED, to continue Coumadin though INR is below goal >> to start heparin bridge.  Patient has an INR goal of 2-3 on a heartmate 3 LVAD and also because has factor V leiden, but with low Hgb 7.8, MD has lowered the goal to 2.0-2.5. No GI bleeding or any other overt bleeding, Fecal occult pending.  HL therapeutic at 0.37, INR 1.63. Last Warfarin dose on 11/24.  PTA dose: Warfarin 5 mg daily, except T/Thus 7.5 mg  Goal of Therapy:  INR 2.0-2.5 Monitor platelets by anticoagulation protocol: Yes   Plan:  Continue heparin gtt at 1200 units/hr  Heparin levels and CBC daily Coumadin 7.100m x1 today Daily INR   SLeroy Libman PharmD Pharmacy Resident Pager: [phone]

## 2017-08-02 ENCOUNTER — Other Ambulatory Visit (HOSPITAL_COMMUNITY): Payer: Self-pay

## 2017-08-02 ENCOUNTER — Encounter (HOSPITAL_COMMUNITY): Payer: BLUE CROSS/BLUE SHIELD

## 2017-08-02 DIAGNOSIS — E876 Hypokalemia: Secondary | ICD-10-CM

## 2017-08-02 LAB — GLUCOSE, CAPILLARY
GLUCOSE-CAPILLARY: 121 mg/dL — AB (ref 65–99)
Glucose-Capillary: 112 mg/dL — ABNORMAL HIGH (ref 65–99)
Glucose-Capillary: 114 mg/dL — ABNORMAL HIGH (ref 65–99)
Glucose-Capillary: 111 mg/dL — ABNORMAL HIGH (ref 65–99)
Glucose-Capillary: 136 mg/dL — ABNORMAL HIGH (ref 65–99)

## 2017-08-02 LAB — BASIC METABOLIC PANEL
ANION GAP: 14 (ref 5–15)
BUN: 28 mg/dL — ABNORMAL HIGH (ref 6–20)
CALCIUM: 8.4 mg/dL — AB (ref 8.9–10.3)
CHLORIDE: 96 mmol/L — AB (ref 101–111)
CO2: 24 mmol/L (ref 22–32)
CREATININE: 5.25 mg/dL — AB (ref 0.61–1.24)
GFR calc non Af Amer: 11 mL/min — ABNORMAL LOW (ref >60)
GFR, EST AFRICAN AMERICAN: 13 mL/min — AB (ref >60)
Glucose, Bld: 119 mg/dL — ABNORMAL HIGH (ref 65–99)
Potassium: 4.1 mmol/L (ref 3.5–5.1)
SODIUM: 134 mmol/L — AB (ref 135–145)

## 2017-08-02 LAB — CBC
HCT: 27.9 % — ABNORMAL LOW (ref 39.0–52.0)
Hemoglobin: 8.7 g/dL — ABNORMAL LOW (ref 13.0–17.0)
MCH: 24.9 pg — ABNORMAL LOW (ref 26.0–34.0)
MCHC: 31.2 g/dL (ref 30.0–36.0)
MCV: 79.9 fL (ref 78.0–100.0)
PLATELETS: 296 10*3/uL (ref 150–400)
RBC: 3.49 MIL/uL — AB (ref 4.22–5.81)
RDW: 18.3 % — AB (ref 11.5–15.5)
WBC: 12.7 10*3/uL — AB (ref 4.0–10.5)

## 2017-08-02 LAB — PROTIME-INR
INR: 2.01
PROTHROMBIN TIME: 22.6 s — AB (ref 11.4–15.2)

## 2017-08-02 LAB — HEPARIN LEVEL (UNFRACTIONATED): HEPARIN UNFRACTIONATED: 0.32 [IU]/mL (ref 0.30–0.70)

## 2017-08-02 LAB — LACTATE DEHYDROGENASE: LDH: 248 U/L — ABNORMAL HIGH (ref 98–192)

## 2017-08-02 MED ORDER — HYDRALAZINE HCL 50 MG PO TABS
25.0000 mg | ORAL_TABLET | Freq: Three times a day (TID) | ORAL | Status: DC
  Administered 2017-08-02 – 2017-08-03 (×3): 25 mg via ORAL
  Filled 2017-08-02 (×3): qty 1

## 2017-08-02 MED ORDER — HEPARIN SODIUM (PORCINE) 1000 UNIT/ML DIALYSIS: 1000.0000 [IU] | INTRAMUSCULAR | Status: DC | PRN

## 2017-08-02 MED ORDER — METOCLOPRAMIDE HCL 10 MG PO TABS
5.0000 mg | ORAL_TABLET | Freq: Three times a day (TID) | ORAL | Status: DC
  Administered 2017-08-02 – 2017-08-04 (×8): 5 mg via ORAL
  Filled 2017-08-02 (×8): qty 1

## 2017-08-02 MED ORDER — ALTEPLASE 2 MG IJ SOLR: 2.0000 mg | Freq: Once | INTRAMUSCULAR | Status: DC | PRN

## 2017-08-02 MED ORDER — LIDOCAINE HCL (PF) 1 % IJ SOLN: 5.0000 mL | INTRAMUSCULAR | Status: DC | PRN

## 2017-08-02 MED ORDER — HEPARIN SODIUM (PORCINE) 1000 UNIT/ML DIALYSIS: 3000.0000 [IU] | INTRAMUSCULAR | Status: DC | PRN

## 2017-08-02 MED ORDER — SODIUM CHLORIDE 0.9 % IV SOLN: 100.0000 mL | INTRAVENOUS | Status: DC | PRN

## 2017-08-02 MED ORDER — WARFARIN SODIUM 5 MG PO TABS
5.0000 mg | ORAL_TABLET | Freq: Once | ORAL | Status: AC
  Administered 2017-08-02: 5 mg via ORAL
  Filled 2017-08-02: qty 1

## 2017-08-02 MED ORDER — PROMETHAZINE HCL 25 MG PO TABS
12.5000 mg | ORAL_TABLET | Freq: Three times a day (TID) | ORAL | Status: DC
  Administered 2017-08-02 – 2017-08-03 (×4): 12.5 mg via ORAL
  Filled 2017-08-02 (×4): qty 1

## 2017-08-02 MED ORDER — LIDOCAINE-PRILOCAINE 2.5-2.5 % EX CREA: 1.0000 "application " | TOPICAL_CREAM | CUTANEOUS | Status: DC | PRN

## 2017-08-02 MED ORDER — PENTAFLUOROPROP-TETRAFLUOROETH EX AERO: 1.0000 "application " | INHALATION_SPRAY | CUTANEOUS | Status: DC | PRN

## 2017-08-02 NOTE — Progress Notes (Signed)
ANTICOAGULATION CONSULT NOTE - Initial Consult  Pharmacy Consult for heparin and warfarin Indication: LVAD, h/o DVT, and factor V leiden  Allergies  Allergen Reactions  . Metformin And Related Diarrhea    Patient Measurements: Height: 5' 8"  (172.7 cm) Weight: 180 lb 1.9 oz (81.7 kg) IBW/kg (Calculated) : 68.4  Vital Signs: Temp: 98.1 F (36.7 C) (11/28 0731) Temp Source: Oral (11/28 0731) BP: 147/101 (11/28 0731) Pulse Rate: 92 (11/28 0731)  Labs: Recent Labs    07/31/17 0035 07/31/17 0405  08/01/17 0219 08/01/17 1312 08/02/17 0300  HGB 9.4* 8.4*  --  7.8*  --  8.7*  HCT 29.5* 26.3*  --  24.4*  --  27.9*  PLT 288 278  --  288  --  296  LABPROT 17.9*  --   --  19.2*  --  22.6*  INR 1.49  --   --  1.63  --  2.01  HEPARINUNFRC  --   --    < > <0.10* 0.37 0.32  CREATININE 7.39* 7.33*  --  8.71*  --  5.25*   < > = values in this interval not displayed.    Estimated Creatinine Clearance: 15.9 mL/min (A) (by C-G formula based on SCr of 5.25 mg/dL (H)).   Medical History: Past Medical History:  Diagnosis Date  . Angina   . ASCVD (arteriosclerotic cardiovascular disease)    , Anterior infarction 2005, LAD diagonal bifurcation intervention 03/2004  . Automatic implantable cardiac defibrillator -St. Jude's       . Benign neoplasm of colon   . CHF (congestive heart failure) ([place])   . Chronic systolic heart failure ([place])   . Coronary artery disease     Widely patent previously placed stents in the left anterior   . Crohn's disease ([place])   . 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 Dyspnea   . Gastroparesis   . GERD (gastroesophageal reflux disease)   . High cholesterol   . Hyperlipidemia   . Hypersomnolent    Previous diagnosis of narcolepsy  . Hypertension, essential   . Ischemic cardiomyopathy    Ejection fraction 15-20% catheterization 2010  . Type II or unspecified type diabetes mellitus without mention of complication, not stated as  uncontrolled   . Unspecified gastritis and gastroduodenitis without mention of hemorrhage     Assessment: 51yo male c/o N/V, contacted LVAD coordinator who asked pt to report to ED, to continue Coumadin though INR is below goal >> to start heparin bridge.  Patient has an INR goal of 2-3 on a heartmate 3 LVAD and also because has factor V leiden, but with low Hgb, MD has lowered the goal to 2.0-2.5. No GI bleeding or any other overt bleeding, Fecal occult pending. Hgb has improved 7.8>8.7  INR 2.01, received 7.5 mg last night.   PTA dose: Warfarin 5 mg daily, except T/Thus 7.5 mg  Goal of Therapy:  INR 2.0-2.5 Monitor platelets by anticoagulation protocol: Yes   Plan:  Discontinue heparin Coumadin 41m x1 today Daily INR and CBC Follow up on changes to INR goal  SLeroy Libman PharmD Pharmacy Resident Pager: [phone]

## 2017-08-02 NOTE — Progress Notes (Signed)
Daily Rounding Note  08/02/2017, 8:30 AM  LOS: 2 days   SUBJECTIVE:   Chief complaint:  Nausea, vomiting.  Nausea recurrs periodically, last occurrence was after drinking water this AM.  Keeping down full liquids of small to moderate volume.  Anorexia persists.  Brown stool yesterday.      OBJECTIVE:         Vital signs in last 24 hours:    Temp:  [98.1 F (36.7 C)-99.4 F (37.4 C)] 98.1 F (36.7 C) (11/28 0731) Pulse Rate:  [87-96] 92 (11/28 0731) Resp:  [17-30] 19 (11/28 0731) BP: (99-160)/(82-109) 147/101 (11/28 0731) SpO2:  [92 %-97 %] 92 % (11/28 0731) Weight:  [81.7 kg (180 lb 1.9 oz)] 81.7 kg (180 lb 1.9 oz) (11/27 1125) Last BM Date: 07/31/17 Filed Weights   08/01/17 0422 08/01/17 0744 08/01/17 1125  Weight: 83 kg (182 lb 14.4 oz) 84.1 kg (185 lb 6.5 oz) 81.7 kg (180 lb 1.9 oz)   General: pleasant, comfortable, chronically ill looking   Heart: rhythmic VAD hum Chest: clear bil.  No dyspnea or cough. Abdomen: VAD site on left benign.  Active BS, NT, ND.    Extremities: no CCE Neuro/Psych:  Alert.  Fully oriented.  No gross deficits.    Intake/Output from previous day: 11/27 0701 - 11/28 0700 In: 283.4 [P.O.:120; I.V.:163.4] Out: 1000   Intake/Output this shift: No intake/output data recorded.  Lab Results: Recent Labs    07/31/17 0405 08/01/17 0219 08/02/17 0300  WBC 11.0* 10.4 12.7*  HGB 8.4* 7.8* 8.7*  HCT 26.3* 24.4* 27.9*  PLT 278 288 296   BMET Recent Labs    07/31/17 0405 08/01/17 0219 08/02/17 0300  NA 136 137 134*  K 4.6 5.2* 4.1  CL 102 103 96*  CO2 18* 19* 24  GLUCOSE 152* 141* 119*  BUN 48* 61* 28*  CREATININE 7.33* 8.71* 5.25*  CALCIUM 8.6* 8.8* 8.4*   LFT Recent Labs    07/31/17 0035  PROT 7.0  ALBUMIN 2.8*  AST 16  ALT 15*  ALKPHOS 130*  BILITOT 0.8   PT/INR Recent Labs    08/01/17 0219 08/02/17 0300  LABPROT 19.2* 22.6*  INR 1.63 2.01   Scheduled  Meds: . amiodarone  200 mg Oral Daily  . amLODipine  5 mg Oral Daily  . aspirin EC  81 mg Oral Daily  . erythromycin  250 mg Oral TID AC  . hydrALAZINE  25 mg Oral Q8H  . insulin aspart  0-9 Units Subcutaneous TID WC  . metoCLOPramide (REGLAN) injection  5 mg Intravenous TID AC & HS  . pantoprazole  40 mg Oral Q0600  . promethazine  12.5 mg Intravenous Q6H  . sildenafil  20 mg Oral TID  . sodium chloride flush  3 mL Intravenous Q12H  . Warfarin - Pharmacist Dosing Inpatient   Does not apply q1800   Continuous Infusions: . sodium chloride     PRN Meds:.sodium chloride, acetaminophen, hydrALAZINE, LORazepam, ondansetron (ZOFRAN) IV, sodium chloride flush  ASSESMENT:   *  Acute flare diabetic gastroparesis in LVAD, Coumadin pt.  Improved but not yet at baseline   *  Anemia, acute on chronic.  Improved.  Not iron deficient.    *  Chronic Coumadin.  Subtherapeutic INR now at his goal level (2 to 2.5).  Heparin stops today.    *  ESRD.  HD 11/27, off his normal MWF schedule.    *  2003 ascending colectomy/appendectomy/diverting colostomy 2003 for gangrenous appendicitis, ileostomy reversed 2003.    *  Low albumin. Suspect protein malnutrition.    PLAN   *  Continue current meds: E mycin, Protonix.  Switch to po scheduled phenergan and Reglan AC and HS.    *  Advance to carb mod diet.     Azucena Freed  08/02/2017, 8:30 AM Pager: [phone]    Attending physician's note   I have taken an interval history, reviewed the chart and examined the patient. I agree with the Advanced Practitioner's note, impression and recommendations.  Gastroparesis secondary to DM, improving on current regimen.  Change metoclopramide and phenergan to po.  Continue erythromycin and pantoprazole.  Advance to gastroparesis and DM diet: carb mod, low fat, low fiber.  If stable overnight would be ready for discharge tomorrow from GI standpoint.   Lucio Edward, MD [facility] [phone] Mon-Fri  8a-5p [phone] after 5p, weekends, holidays

## 2017-08-02 NOTE — Progress Notes (Signed)
[facility] Progress Note  Subjective: no new c/o  Vitals:   08/02/17 0000 08/02/17 0400 08/02/17 0540 08/02/17 0731  BP:   (!) 111/93 (!) 147/101  Pulse:  93  92  Resp:  17  19  Temp: 98.8 F (37.1 C) 99.4 F (37.4 C)  98.1 F (36.7 C)  TempSrc: Oral Oral  Oral  SpO2:  95%  92%  Weight:      Height:        Inpatient medications: . amiodarone  200 mg Oral Daily  . amLODipine  5 mg Oral Daily  . aspirin EC  81 mg Oral Daily  . erythromycin  250 mg Oral TID AC  . hydrALAZINE  25 mg Oral Q8H  . insulin aspart  0-9 Units Subcutaneous TID WC  . metoCLOPramide  5 mg Oral TID AC & HS  . pantoprazole  40 mg Oral Q0600  . promethazine  12.5 mg Oral TID AC & HS  . sildenafil  20 mg Oral TID  . sodium chloride flush  3 mL Intravenous Q12H  . Warfarin - Pharmacist Dosing Inpatient   Does not apply q1800   . sodium chloride     sodium chloride, acetaminophen, hydrALAZINE, LORazepam, ondansetron (ZOFRAN) IV, sodium chloride flush  Exam: Gen alert, no distress No jvd or bruits Chest clear bilat RRR no MRG Abd soft ntnd , driveline in L mid abd for lvad Ext no LE edema Neuro alert, Ox 3 , nf  R IJ TDC    Dialysis: MWF [facility] 4h 14mn  81.5kg  3K/2Ca bath  Hep 8000 bolus   R IJ TDC - Venofer 1050mx 10 ordered (thru 08/18/17 ) - Mircera 1001mIV q 2 weeks ordered (last 11/20 at OP unit) - Calcitriol 0.5mc30mO q HD      Impression: 1. Nausea/ vomiting - hx gastroparesis, per primary team 2. ESRD on HD MWF.  3. ICM/ chron syst CHF/ sp ICD/ sp LVAD - on IV heparin, per cards 4. Anemia of CKD - Hb 8- 10 range. Next esa due on 12/4. Cont IV fe load.  5. MBD of CKD - cont vdra/ binders 6. Vol - no vol excess, at dry wt today after HD 7. CAD - not active 8. HTN - on norvasc/ hydral as at home. Per cards. Vol is ok, at dry wt no excess  9. DM2 10. Hx DVT/ factor V hetero - on coumadin at home   Plan - HD today   Rob Kelly [facility] pager [phone]/28/2018, 9:41 AM   Recent Labs  Lab 07/31/17 0405 08/01/17 0219 08/02/17 0300  NA 136 137 134*  K 4.6 5.2* 4.1  CL 102 103 96*  CO2 18* 19* 24  GLUCOSE 152* 141* 119*  BUN 48* 61* 28*  CREATININE 7.33* 8.71* 5.25*  CALCIUM 8.6* 8.8* 8.4*   Recent Labs  Lab 07/31/17 0035  AST 16  ALT 15*  ALKPHOS 130*  BILITOT 0.8  PROT 7.0  ALBUMIN 2.8*   Recent Labs  Lab 07/31/17 0035 07/31/17 0405 08/01/17 0219 08/02/17 0300  WBC 8.7 11.0* 10.4 12.7*  NEUTROABS 7.3  --   --   --   HGB 9.4* 8.4* 7.8* 8.7*  HCT 29.5* 26.3* 24.4* 27.9*  MCV 79.1 79.7 78.5 79.9  PLT 288 278 288 296   Iron/TIBC/Ferritin/ %Sat    Component Value Date/Time   IRON 57 08/01/2017 0219   TIBC 237 (L) 08/01/2017  [place] 08/01/2017 0219   IRONPCTSAT 24 08/01/2017 3343

## 2017-08-02 NOTE — Progress Notes (Signed)
LVAD Coordinator Rounding Note:  Admitted 07/31/17 for intractable nausea/vomiting due to gastroparesis.  HM3LVAD implanted on 10/8/2018by Dr Elpidio [name] Destination Therapy criteria due to renal insufficiency.  Pt states he is feeling better overall, but does have intermittent "waves of nausea" lasting a few seconds. States he has these same types of episodes at home.   Vital signs: Temp: 98.1 HR:  92 Doppler: 110  Auto cuff: 147/101 (99) O2 Sat: 95 % on RA  Wt: 181>180 182>185>180 lbs   LVAD interrogation reveals:  Speed: 5600 Flow:  3.7 Power: 4.2w PI: 5.9 Alarms: none Events: none Hematocrit: 28 Fixed speed: 5600 Low speed limit: 5300  Back up black bag with controller, batteries, and extra clips at bedside.    Drive Line: Daily. Advanced to weekly dressings. Due Wednesday 08/09/17.  Labs:  LDH trend: 216>285>248  INR trend: 1.49>1.63>2.01  Hgb trend: 9.4>7.8>8.4  Anticoagulation Plan: -INR Goal: 2-3 -ASA Dose: 81 mg  Gtts: Heparin 1200 units/hr - off 08/02/17  Device: -St Jude dual ICD -Therapies: ON at 200 bpm  Renal:  Creat trend: 7.39>8.71>5.25 HD - 08/01/17  Plan/Recommendations:  1.  VAD dressing changed today - no issues.  2.  Call VAD pager for any VAD equipment issues.    Zada Girt RN, VAD Coordinator 24/7 pager [phone]

## 2017-08-02 NOTE — Progress Notes (Signed)
Patient ID: [name], male   DOB: 15-Feb-1966, 51 y.o.   MRN: [id number]   Advanced Heart Failure VAD Team Note  Subjective:    Slow improvement, less nauseated today and plans to try to eat breakfast.    MAP elevated but he is taking pills again.   Hemoglobin higher, not Fe deficient and had brown stool.   He has been tolerating HD without problems.   LVAD INTERROGATION:  HeartMate II LVAD:  Flow 3.7 liters/min, speed 5600, power 4.2, PI 5.6.    Objective:    Vital Signs:   Temp:  [98.1 F (36.7 C)-99.4 F (37.4 C)] 98.1 F (36.7 C) (11/28 0731) Pulse Rate:  [87-96] 92 (11/28 0731) Resp:  [17-30] 19 (11/28 0731) BP: (99-160)/(82-109) 147/101 (11/28 0731) SpO2:  [92 %-97 %] 92 % (11/28 0731) Weight:  [180 lb 1.9 oz (81.7 kg)] 180 lb 1.9 oz (81.7 kg) (11/27 1125) Last BM Date: 07/31/17 Mean arterial Pressure 100s-110s  Intake/Output:   Intake/Output Summary (Last 24 hours) at 08/02/2017 0824 Last data filed at 08/01/2017 1900 Gross per 24 hour  Intake 283.4 ml  Output 1000 ml  Net -716.6 ml     Physical Exam    GENERAL: Well appearing this am. NAD.  HEENT: Normal. NECK: Supple, JVP 8 cm. Carotids OK.  CARDIAC:  Mechanical heart sounds with LVAD hum present.  LUNGS:  CTAB, normal effort.  ABDOMEN:  NT, ND, no HSM. No bruits or masses. +BS  LVAD exit site: Well-healed and incorporated. Dressing dry and intact. No erythema or drainage. Stabilization device present and accurately applied. Driveline dressing changed daily per sterile technique. EXTREMITIES:  Warm and dry. No cyanosis, clubbing, rash, or edema.  NEUROLOGIC:  Alert & oriented x 3. Cranial nerves grossly intact. Moves all 4 extremities w/o difficulty. Affect pleasant      Telemetry   NSR (personally reviewed)  Labs   Basic Metabolic Panel: Recent Labs  Lab 07/26/17 1115 07/31/17 0035 07/31/17 0405 08/01/17 0219 08/02/17 0300  NA 131* 136 136 137 134*  K 4.5 4.2 4.6 5.2* 4.1  CL  96* 100* 102 103 96*  CO2 24 24 18* 19* 24  GLUCOSE 229* 116* 152* 141* 119*  BUN 22* 47* 48* 61* 28*  CREATININE 4.56* 7.39* 7.33* 8.71* 5.25*  CALCIUM 8.3* 8.9 8.6* 8.8* 8.4*    Liver Function Tests: Recent Labs  Lab 07/31/17 0035  AST 16  ALT 15*  ALKPHOS 130*  BILITOT 0.8  PROT 7.0  ALBUMIN 2.8*   No results for input(s): LIPASE, AMYLASE in the last 168 hours. No results for input(s): AMMONIA in the last 168 hours.  CBC: Recent Labs  Lab 07/26/17 1115 07/31/17 0035 07/31/17 0405 08/01/17 0219 08/02/17 0300  WBC 8.9 8.7 11.0* 10.4 12.7*  NEUTROABS  --  7.3  --   --   --   HGB 8.7* 9.4* 8.4* 7.8* 8.7*  HCT 28.0* 29.5* 26.3* 24.4* 27.9*  MCV 80.7 79.1 79.7 78.5 79.9  PLT 348 288 278 288 296    INR: Recent Labs  Lab 07/26/17 1115 07/31/17 0035 08/01/17 0219 08/02/17 0300  INR 2.23 1.49 1.63 2.01    Other results:  EKG:    Imaging   No results found.   Medications:     Scheduled Medications: . amiodarone  200 mg Oral Daily  . amLODipine  5 mg Oral Daily  . aspirin EC  81 mg Oral Daily  . erythromycin  250 mg Oral TID  AC  . hydrALAZINE  25 mg Oral Q8H  . insulin aspart  0-9 Units Subcutaneous TID WC  . metoCLOPramide (REGLAN) injection  5 mg Intravenous TID AC & HS  . pantoprazole  40 mg Oral Q0600  . promethazine  12.5 mg Intravenous Q6H  . sildenafil  20 mg Oral TID  . sodium chloride flush  3 mL Intravenous Q12H  . Warfarin - Pharmacist Dosing Inpatient   Does not apply q1800    Infusions: . sodium chloride    . heparin 1,200 Units/hr (08/01/17 2323)    PRN Medications: sodium chloride, acetaminophen, hydrALAZINE, LORazepam, ondansetron (ZOFRAN) IV, sodium chloride flush   Patient Profile   Mr Rattigan is a 51 yo male with gout, DM2, CAD s/p anterior MI with severe systolic HF (EF 34%) s/p St Jude ICD & Heartmate 3 LVAD and ESRD being admitted for recurrent intractable N/V due to gastroparesis.  Assessment/Plan:    1.  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 He improved considerably with scheduled Reglan and erythromycin and was discharged on 07/16/17.  Symptoms have recurred.  Appreciate GI evaluation.  Gradually improving.  - Continue Reglan 5 mg IV tid/ac.   - po erythromycin begun to promote motility, will continue for at least a week after discharge.  - Slowly advance diet.   - Continue Protonix and prn Zofran.  2. Chronic systolic CHF: Ischemic cardiomyopathy. St Jude ICD. NSTEMI in March 2018 with DES to LAD and RCA, complicated by cardiogenic shock, low output requiring milrinone. Echo 8/18 with EF 20-25%, moderate MR. CPX (8/18) with severe functional impairment due to HF. He is now s/p HM3 LVAD placement.  MAP remains elevated but now able to take his pills.  - Continue amlodipine, increase hydralazine to 25 mg tid.   - Continue ASA 81 + coumadin with INR 2-2.5. On heparin gtt with subtherapeutic INR, INR 2 today so can stop heparin.  3. ESRD: Developed peri-operatively after HM3 placement.  He is tolerating HD without problems currently.  4. CAD: NSTEMI in 3/18. LHC with 99% ulcerated lesion proximal RCA with left to right collaterals, 95% mid LAD stenosis after mid LAD stent. s/p PCI to RCA and LAD on 11/25/16. No exertional chest pain.  - Continue statin and ASA. 5. h/o DVTs: Factor V Leiden heterozygote.  On warfarin.  6. DM: Insulin sliding scale for now with poor po intake.  7. HTN: Increasing hydralazine today.    8. PAD: Moderate bilateral disease on ABIs in 8/18.  Denies claudication currently. 9. Anemia: Hemoglobin higher at 8.7.  No overt GI bleeding.  Fe studies normal.  - Renal disease likely plays a role, Aranesp per nephrology.  - Guaiac stool.  - Transfuse hgb < 7.   I reviewed the LVAD parameters from today, and compared the results to the patient's prior recorded data.  No  programming changes were made.  The LVAD is functioning within specified parameters.  The patient performs LVAD self-test daily.  LVAD interrogation was negative for any significant power changes, alarms or PI events/speed drops.  LVAD equipment check completed and is in good working order.  Back-up equipment present.   LVAD education done on emergency procedures and precautions and reviewed exit site care.  Length of Stay: 2  Loralie Champagne, MD 08/02/2017, 8:24 AM  VAD Team --- VAD ISSUES ONLY--- Pager [phone] (7am - 7am)  Advanced Heart Failure Team  Pager [phone] (M-F;  7a - 4p)  Please contact [facility] for night-coverage after hours (4p -7a ) and weekends on [email or web address]

## 2017-08-03 DIAGNOSIS — R791 Abnormal coagulation profile: Secondary | ICD-10-CM

## 2017-08-03 LAB — GLUCOSE, CAPILLARY
GLUCOSE-CAPILLARY: 97 mg/dL (ref 65–99)
GLUCOSE-CAPILLARY: 126 mg/dL — AB (ref 65–99)
Glucose-Capillary: 124 mg/dL — ABNORMAL HIGH (ref 65–99)
Glucose-Capillary: 112 mg/dL — ABNORMAL HIGH (ref 65–99)

## 2017-08-03 LAB — CBC
HCT: 26.3 % — ABNORMAL LOW (ref 39.0–52.0)
Hemoglobin: 8.4 g/dL — ABNORMAL LOW (ref 13.0–17.0)
MCH: 25.4 pg — AB (ref 26.0–34.0)
MCHC: 31.9 g/dL (ref 30.0–36.0)
MCV: 79.5 fL (ref 78.0–100.0)
PLATELETS: 241 10*3/uL (ref 150–400)
RBC: 3.31 MIL/uL — AB (ref 4.22–5.81)
RDW: 18.5 % — AB (ref 11.5–15.5)
WBC: 8.7 10*3/uL (ref 4.0–10.5)

## 2017-08-03 LAB — BASIC METABOLIC PANEL
Anion gap: 10 (ref 5–15)
BUN: 15 mg/dL (ref 6–20)
CALCIUM: 7.9 mg/dL — AB (ref 8.9–10.3)
CO2: 26 mmol/L (ref 22–32)
CREATININE: 3.8 mg/dL — AB (ref 0.61–1.24)
Chloride: 97 mmol/L — ABNORMAL LOW (ref 101–111)
GFR calc non Af Amer: 17 mL/min — ABNORMAL LOW (ref >60)
GFR, EST AFRICAN AMERICAN: 20 mL/min — AB (ref >60)
Glucose, Bld: 100 mg/dL — ABNORMAL HIGH (ref 65–99)
Potassium: 3.3 mmol/L — ABNORMAL LOW (ref 3.5–5.1)
SODIUM: 133 mmol/L — AB (ref 135–145)

## 2017-08-03 LAB — LACTATE DEHYDROGENASE: LDH: 217 U/L — ABNORMAL HIGH (ref 98–192)

## 2017-08-03 LAB — PROTIME-INR
INR: 2.65
PROTHROMBIN TIME: 28 s — AB (ref 11.4–15.2)

## 2017-08-03 MED ORDER — PROMETHAZINE HCL 25 MG/ML IJ SOLN
12.5000 mg | Freq: Three times a day (TID) | INTRAMUSCULAR | Status: DC
  Administered 2017-08-03 (×4): 12.5 mg via INTRAVENOUS
  Filled 2017-08-03 (×4): qty 1

## 2017-08-03 MED ORDER — HYDRALAZINE HCL 25 MG PO TABS
37.5000 mg | ORAL_TABLET | Freq: Three times a day (TID) | ORAL | Status: DC
  Administered 2017-08-03 – 2017-08-04 (×3): 37.5 mg via ORAL
  Filled 2017-08-03 (×4): qty 1.5

## 2017-08-03 MED ORDER — WARFARIN SODIUM 2.5 MG PO TABS
2.5000 mg | ORAL_TABLET | Freq: Once | ORAL | Status: AC
  Administered 2017-08-03: 2.5 mg via ORAL
  Filled 2017-08-03: qty 1

## 2017-08-03 NOTE — Progress Notes (Signed)
Daily Rounding Note  08/03/2017, 8:52 AM  LOS: 3 days   SUBJECTIVE:   Chief complaint: nausea.  Did well last night, ate most of dinner.  BM last night. This AM nauseated, has not yet received his breakfast tray.  Says po Phenergan does not work as well as the IV form.  No abdominal pain       OBJECTIVE:         Vital signs in last 24 hours:    Temp:  [98 F (36.7 C)-99.2 F (37.3 C)] 98 F (36.7 C) (11/29 0725) Pulse Rate:  [87-98] 89 (11/29 0725) Resp:  [16-20] 18 (11/29 0725) BP: (124-151)/(52-111) 143/102 (11/29 0746) SpO2:  [91 %-96 %] 96 % (11/29 0725) Weight:  [80.9 kg (178 lb 4.8 oz)-81.4 kg (179 lb 7.3 oz)] 80.9 kg (178 lb 4.8 oz) (11/29 0443) Last BM Date: 08/02/17 Filed Weights   08/02/17 1330 08/02/17 1725 08/03/17 0443  Weight: 81.4 kg (179 lb 7.3 oz) 81.4 kg (179 lb 7.3 oz) 80.9 kg (178 lb 4.8 oz)   General: pleasant, chronically ill looking   Heart: VAD hum and rhythm Chest: clear, no SOB or cough Abdomen: soft, VAD line site on left benign.  Tender, active BS  Extremities: no CCE Neuro/Psych:  Oriented x 3.  Alert.  No gross deficits.  Speech fluid.  Affect somewhat flat.    Intake/Output from previous day: 11/28 0701 - 11/29 0700 In: 480 [P.O.:480] Out: 1000 [Urine:1000]  Intake/Output this shift: No intake/output data recorded.  Lab Results: Recent Labs    08/01/17 0219 08/02/17 0300 08/03/17 0355  WBC 10.4 12.7* 8.7  HGB 7.8* 8.7* 8.4*  HCT 24.4* 27.9* 26.3*  PLT 288 296 241   BMET Recent Labs    08/01/17 0219 08/02/17 0300 08/03/17 0355  NA 137 134* 133*  K 5.2* 4.1 3.3*  CL 103 96* 97*  CO2 19* 24 26  GLUCOSE 141* 119* 100*  BUN 61* 28* 15  CREATININE 8.71* 5.25* 3.80*  CALCIUM 8.8* 8.4* 7.9*    PT/INR Recent Labs    08/02/17 0300 08/03/17 0355  LABPROT 22.6* 28.0*  INR 2.01 2.65    Studies/Results: No results found.   Scheduled Meds: . amiodarone  200  mg Oral Daily  . amLODipine  5 mg Oral Daily  . aspirin EC  81 mg Oral Daily  . erythromycin  250 mg Oral TID AC  . hydrALAZINE  25 mg Oral Q8H  . insulin aspart  0-9 Units Subcutaneous TID WC  . metoCLOPramide  5 mg Oral TID AC & HS  . pantoprazole  40 mg Oral Q0600  . promethazine  12.5 mg Oral TID AC & HS  . sildenafil  20 mg Oral TID  . sodium chloride flush  3 mL Intravenous Q12H  . Warfarin - Pharmacist Dosing Inpatient   Does not apply q1800   Continuous Infusions: . sodium chloride    . sodium chloride    . sodium chloride     PRN Meds:.sodium chloride, sodium chloride, sodium chloride, acetaminophen, alteplase, heparin, heparin, hydrALAZINE, lidocaine (PF), lidocaine-prilocaine, LORazepam, ondansetron (ZOFRAN) IV, pentafluoroprop-tetrafluoroeth, sodium chloride flush   ASSESMENT:   *Diabetic gastroparesis in LVAD, Coumadin pt.po erythromycin, po reglan, po phenergan, po protonix  * Anemia, acute on chronic. Not iron deficient. Next ESA due 12/4, at dialysis.    * Chronic Coumadin.  Subtherapeutic INR at admission now 2.6: slightly above  goal level (2 to  2.5).    * ESRD.  HD 11/27 and 11/28, back on track of MWF schedule.    *  Hypokalemia.      PLAN   *  Switch scheduled phenergan back to IV, leave all other meds in place.    Azucena Freed  08/03/2017, 8:52 AM Pager: [phone]    Attending physician's note   I have taken an interval history, reviewed the chart and examined the patient. I agree with the Advanced Practitioner's note, impression and recommendations. More nausea today. Changed Phenergan back to IV. Can resume full liquids for here and at home if solids not well tolerated.   Lucio Edward, MD [facility] [phone] Mon-Fri 8a-5p [phone] after 5p, weekends, holidays

## 2017-08-03 NOTE — Progress Notes (Signed)
ANTICOAGULATION CONSULT NOTE - Initial Consult  Pharmacy Consult warfarin Indication: LVAD, h/o DVT, and factor V leiden  Allergies  Allergen Reactions  . Metformin And Related Diarrhea    Patient Measurements: Height: 5' 8"  (172.7 cm) Weight: 178 lb 4.8 oz (80.9 kg) IBW/kg (Calculated) : 68.4  Vital Signs: Temp: 98 F (36.7 C) (11/29 0746) Temp Source: Oral (11/29 0746) BP: 143/102 (11/29 0746) Pulse Rate: 90 (11/29 0746)  Labs: Recent Labs    08/01/17 0219 08/01/17 1312 08/02/17 0300 08/03/17 0355  HGB 7.8*  --  8.7* 8.4*  HCT 24.4*  --  27.9* 26.3*  PLT 288  --  296 241  LABPROT 19.2*  --  22.6* 28.0*  INR 1.63  --  2.01 2.65  HEPARINUNFRC <0.10* 0.37 0.32  --   CREATININE 8.71*  --  5.25* 3.80*    Estimated Creatinine Clearance: 22 mL/min (A) (by C-G formula based on SCr of 3.8 mg/dL (H)).   Medical History: Past Medical History:  Diagnosis Date  . Angina   . ASCVD (arteriosclerotic cardiovascular disease)    , Anterior infarction 2005, LAD diagonal bifurcation intervention 03/2004  . Automatic implantable cardiac defibrillator -St. Jude's       . Benign neoplasm of colon   . CHF (congestive heart failure) ([place])   . Chronic systolic heart failure ([place])   . Coronary artery disease     Widely patent previously placed stents in the left anterior   . Crohn's disease ([place])   . 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 Dyspnea   . Gastroparesis   . GERD (gastroesophageal reflux disease)   . High cholesterol   . Hyperlipidemia   . Hypersomnolent    Previous diagnosis of narcolepsy  . Hypertension, essential   . Ischemic cardiomyopathy    Ejection fraction 15-20% catheterization 2010  . Type II or unspecified type diabetes mellitus without mention of complication, not stated as uncontrolled   . Unspecified gastritis and gastroduodenitis without mention of hemorrhage     Assessment: 50yo male c/o N/V, contacted LVAD coordinator who  asked pt to report to ED, to continue Coumadin though INR is below goal >> to start heparin bridge.  Patient has an INR goal of 2-3 on a heartmate 3 LVAD and also because has factor V leiden, but with low Hgb, MD has lowered the goal to 2.0-2.5. No GI bleeding or any other overt bleeding, Fecal occult pending. Hgb stable at 8.4.  INR supratherapeutic 2.65 this AM   PTA dose: Warfarin 5 mg daily, except T/Thus 7.5 mg  Goal of Therapy:  INR 2.0-2.5 Monitor platelets by anticoagulation protocol: Yes   Plan:  Coumadin 2.5 mg x1 today Daily INR and CBC Follow up on changes to INR goal  Leroy Libman, PharmD Pharmacy Resident Pager: [phone]

## 2017-08-03 NOTE — Progress Notes (Signed)
Patient ID: [name], male   DOB: 11-09-1965, 51 y.o.   MRN: [id number] P  Advanced Heart Failure VAD Team Note  Subjective:    Doing well last night but nauseated again this morning.  Just got IV phenergan.   MAP elevated still but he is taking pills again.   Hemoglobin higher than at admission, not Fe deficient and had brown stool.   He has been tolerating HD without problems.   LVAD INTERROGATION:  HeartMate II LVAD:  Flow 3.8 liters/min, speed 5600, power 4.2, PI 5.3.    Objective:    Vital Signs:   Temp:  [98 F (36.7 C)-99.2 F (37.3 C)] 98 F (36.7 C) (11/29 0725) Pulse Rate:  [87-98] 89 (11/29 0725) Resp:  [16-20] 18 (11/29 0725) BP: (124-151)/(52-111) 143/102 (11/29 0746) SpO2:  [91 %-96 %] 96 % (11/29 0725) Weight:  [178 lb 4.8 oz (80.9 kg)-179 lb 7.3 oz (81.4 kg)] 178 lb 4.8 oz (80.9 kg) (11/29 0443) Last BM Date: 08/02/17 Mean arterial Pressure 100s  Intake/Output:   Intake/Output Summary (Last 24 hours) at 08/03/2017 0947 Last data filed at 08/03/2017 0600 Gross per 24 hour  Intake 240 ml  Output 1000 ml  Net -760 ml     Physical Exam    GENERAL: Well appearing this am. NAD.  HEENT: Normal. NECK: Supple, JVP 7 cm. Carotids OK.  CARDIAC:  Mechanical heart sounds with LVAD hum present.  LUNGS:  CTAB, normal effort.  ABDOMEN:  NT, ND, no HSM. No bruits or masses. +BS  LVAD exit site: Well-healed and incorporated. Dressing dry and intact. No erythema or drainage. Stabilization device present and accurately applied. Driveline dressing changed daily per sterile technique. EXTREMITIES:  Warm and dry. No cyanosis, clubbing, rash, or edema.  NEUROLOGIC:  Alert & oriented x 3. Cranial nerves grossly intact. Moves all 4 extremities w/o difficulty. Affect pleasant     Telemetry   NSR in 90s (personally reviewed)  Labs   Basic Metabolic Panel: Recent Labs  Lab 07/31/17 0035 07/31/17 0405 08/01/17 0219 08/02/17 0300 08/03/17 0355  NA 136 136  137 134* 133*  K 4.2 4.6 5.2* 4.1 3.3*  CL 100* 102 103 96* 97*  CO2 24 18* 19* 24 26  GLUCOSE 116* 152* 141* 119* 100*  BUN 47* 48* 61* 28* 15  CREATININE 7.39* 7.33* 8.71* 5.25* 3.80*  CALCIUM 8.9 8.6* 8.8* 8.4* 7.9*    Liver Function Tests: Recent Labs  Lab 07/31/17 0035  AST 16  ALT 15*  ALKPHOS 130*  BILITOT 0.8  PROT 7.0  ALBUMIN 2.8*   No results for input(s): LIPASE, AMYLASE in the last 168 hours. No results for input(s): AMMONIA in the last 168 hours.  CBC: Recent Labs  Lab 07/31/17 0035 07/31/17 0405 08/01/17 0219 08/02/17 0300 08/03/17 0355  WBC 8.7 11.0* 10.4 12.7* 8.7  NEUTROABS 7.3  --   --   --   --   HGB 9.4* 8.4* 7.8* 8.7* 8.4*  HCT 29.5* 26.3* 24.4* 27.9* 26.3*  MCV 79.1 79.7 78.5 79.9 79.5  PLT 288 278 288 296 241    INR: Recent Labs  Lab 07/31/17 0035 08/01/17 0219 08/02/17 0300 08/03/17 0355  INR 1.49 1.63 2.01 2.65    Other results:  EKG:    Imaging   No results found.   Medications:     Scheduled Medications: . amiodarone  200 mg Oral Daily  . amLODipine  5 mg Oral Daily  . aspirin EC  81  mg Oral Daily  . erythromycin  250 mg Oral TID AC  . hydrALAZINE  37.5 mg Oral Q8H  . insulin aspart  0-9 Units Subcutaneous TID WC  . metoCLOPramide  5 mg Oral TID AC & HS  . pantoprazole  40 mg Oral Q0600  . promethazine  12.5 mg Intravenous TID AC & HS  . sildenafil  20 mg Oral TID  . sodium chloride flush  3 mL Intravenous Q12H  . Warfarin - Pharmacist Dosing Inpatient   Does not apply q1800    Infusions: . sodium chloride    . sodium chloride    . sodium chloride      PRN Medications: sodium chloride, sodium chloride, sodium chloride, acetaminophen, alteplase, heparin, heparin, hydrALAZINE, lidocaine (PF), lidocaine-prilocaine, LORazepam, ondansetron (ZOFRAN) IV, pentafluoroprop-tetrafluoroeth, sodium chloride flush   Patient Profile   [name] is a 51 yo male with gout, DM2, CAD s/p anterior MI with severe  systolic HF (EF 36%) s/p St Jude ICD & Heartmate 3 LVAD and ESRD being admitted for recurrent intractable N/V due to gastroparesis.  Assessment/Plan:    1. 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 He improved considerably with scheduled Reglan and erythromycin and was discharged on 07/16/17.  Symptoms have recurred.  Appreciate GI evaluation.  Had improved yesterday but nauseated again today.  - Continue Reglan 5 mg tid/ac.   - po erythromycin begun to promote motility, will continue for at least a week after discharge.    - Continue Protonix and prn Zofran.  - Had planned to potentially discharge today but with worsening this morning will give IV anti-emetics and monitor for another day.  2. Chronic systolic CHF: Ischemic cardiomyopathy. St Jude ICD. NSTEMI in March 2018 with DES to LAD and RCA, complicated by cardiogenic shock, low output requiring milrinone. Echo 8/18 with EF 20-25%, moderate MR. CPX (8/18) with severe functional impairment due to HF. He is now s/p HM3 LVAD placement.  MAP remains elevated but now able to take his pills.  - Continue amlodipine, increase hydralazine to 37.5 mg tid on non-HD days.   - Continue ASA 81 + coumadin with INR 2-2.5. 3. ESRD: Developed peri-operatively after HM3 placement.  He is tolerating HD without problems currently.  4. CAD: NSTEMI in 3/18. LHC with 99% ulcerated lesion proximal RCA with left to right collaterals, 95% mid LAD stenosis after mid LAD stent. s/p PCI to RCA and LAD on 11/25/16. No exertional chest pain.  - Continue statin and ASA. 5. h/o DVTs: Factor V Leiden heterozygote.  On warfarin.  6. DM: Insulin sliding scale for now with poor po intake.  7. HTN: Increasing hydralazine today.    8. PAD: Moderate bilateral disease on ABIs in 8/18.  Denies claudication currently. 9. Anemia: Stable hemoglobin.  No overt GI bleeding.  Fe  studies normal.  - Renal disease likely plays a role, Aranesp per nephrology.  - Guaiac stool.  - Transfuse hgb < 7.   I reviewed the LVAD parameters from today, and compared the results to the patient's prior recorded data.  No programming changes were made.  The LVAD is functioning within specified parameters.  The patient performs LVAD self-test daily.  LVAD interrogation was negative for any significant power changes, alarms or PI events/speed drops.  LVAD equipment check completed and is in good working order.  Back-up equipment present.   LVAD education done on emergency procedures and precautions and  reviewed exit site care.  Length of Stay: 3  Loralie Champagne, MD 08/03/2017, 9:47 AM  VAD Team --- VAD ISSUES ONLY--- Pager [phone] (7am - 7am)  Advanced Heart Failure Team  Pager [phone] (M-F; 7a - 4p)  Please contact [facility] for night-coverage after hours (4p -7a ) and weekends on [email or web address]

## 2017-08-03 NOTE — Progress Notes (Signed)
[facility] Progress Note  Subjective: no new c/o  Vitals:   08/03/17 0443 08/03/17 0725 08/03/17 0746 08/03/17 1203  BP: (!) 127/111 (!) 140/99 (!) 143/102 (!) 124/96  Pulse: 88 89 90 93  Resp: 19 18    Temp: 98.1 F (36.7 C) 98 F (36.7 C) 98 F (36.7 C) 98.5 F (36.9 C)  TempSrc: Oral Oral Oral Oral  SpO2: 93% 96% 95% 99%  Weight: 80.9 kg (178 lb 4.8 oz)     Height:        Inpatient medications: . amiodarone  200 mg Oral Daily  . amLODipine  5 mg Oral Daily  . aspirin EC  81 mg Oral Daily  . erythromycin  250 mg Oral TID AC  . hydrALAZINE  37.5 mg Oral Q8H  . insulin aspart  0-9 Units Subcutaneous TID WC  . metoCLOPramide  5 mg Oral TID AC & HS  . pantoprazole  40 mg Oral Q0600  . promethazine  12.5 mg Intravenous TID AC & HS  . sildenafil  20 mg Oral TID  . sodium chloride flush  3 mL Intravenous Q12H  . warfarin  2.5 mg Oral ONCE-1800  . Warfarin - Pharmacist Dosing Inpatient   Does not apply q1800   . sodium chloride    . sodium chloride    . sodium chloride     sodium chloride, sodium chloride, sodium chloride, acetaminophen, alteplase, heparin, heparin, hydrALAZINE, lidocaine (PF), lidocaine-prilocaine, LORazepam, ondansetron (ZOFRAN) IV, pentafluoroprop-tetrafluoroeth, sodium chloride flush  Exam: Gen alert, no distress No jvd or bruits Chest clear bilat RRR no MRG Abd soft ntnd , driveline in L mid abd for lvad Ext no LE edema Neuro alert, Ox 3 , nf  R IJ TDC    Dialysis: MWF [facility] 4h 36mn  81.5kg  3K/2Ca bath  Hep 8000 bolus   R IJ TDC - Venofer 108mx 10 ordered (thru 08/18/17 ) - Mircera 10042mIV q 2 weeks ordered (last 11/20 at OP unit) - Calcitriol 0.5mc69mO q HD      Impression: 1. Nausea/ vomiting - hx gastroparesis, per primary team 2. ESRD on HD MWF.  3. ICM/ chron syst CHF/ sp ICD/ sp LVAD - dc'ing IV hep, INR in range 4. Anemia of CKD - Hb 8- 10 range. Next esa due on 12/4. Cont IV fe load.  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 Vol - no vol excess, at dry wt 7. CAD - not active 8. HTN - on norvasc/ hydral as at home. Per cards. Vol is ok, at dry wt no excess  9. DM2 10. Hx DVT/ factor V hetero - on coumadin   Plan - HD Friday, UF 1-2kg as he has prob lost some body wt   Rob Kelly SplinterCaroVolenteer [phone]/29/2018, 12:49 PM   Recent Labs  Lab 08/01/17 0219 08/02/17 0300 08/03/17 0355  NA 137 134* 133*  K 5.2* 4.1 3.3*  CL 103 96* 97*  CO2 19* 24 26  GLUCOSE 141* 119* 100*  BUN 61* 28* 15  CREATININE 8.71* 5.25* 3.80*  CALCIUM 8.8* 8.4* 7.9*   Recent Labs  Lab 07/31/17 0035  AST 16  ALT 15*  ALKPHOS 130*  BILITOT 0.8  PROT 7.0  ALBUMIN 2.8*   Recent Labs  Lab 07/31/17 0035  08/01/17 0219 08/02/17 0300 08/03/17 0355  WBC 8.7   < > 10.4 12.7* 8.7  NEUTROABS 7.3  --   --   --   --  HGB 9.4*   < > 7.8* 8.7* 8.4*  HCT 29.5*   < > 24.4* 27.9* 26.3*  MCV 79.1   < > 78.5 79.9 79.5  PLT 288   < > 288 296 241   < > = values in this interval not displayed.   Iron/TIBC/Ferritin/ %Sat    Component Value Date/Time   IRON 57 08/01/2017 0219   TIBC 237 (L) 08/01/2017 0219   FERRITIN 241 08/01/2017 0219   IRONPCTSAT 24 08/01/2017 2035

## 2017-08-03 NOTE — Progress Notes (Signed)
LVAD Coordinator Rounding Note:  Admitted 07/31/17 for intractable nausea/vomiting due to gastroparesis.  HM3LVAD implanted on 10/8/2018by Dr Elpidio [name] Destination Therapy criteria due to renal insufficiency.  Pt states he did eat regular diet last nigh, but feeling very nauseas at the moment. Repeating that PO phenergan does not "work as well" as IV phenergan. Also states that neither PO or IV Zofran offers much relief.  Vital signs: Temp: 98.0 HR:  89 Doppler: 122 Auto cuff:  143/102 (114) O2 Sat: 96 % on RA  Wt: 181>180 182>185>180>178 lbs   LVAD interrogation reveals:  Speed: 5600 Flow:  3.8 Power: 4.3w PI: 5.1 Alarms: none Events: none Hematocrit: 26 Fixed speed: 5600 Low speed limit: 5300  Back up black bag with controller, batteries, and extra clips at bedside.    Drive Line: Daily. Advanced to weekly dressings. Due Wednesday 08/09/17.  Labs:  LDH trend: 216>285>248>217  INR trend: 1.49>1.63>2.01>2.65  Hgb trend: 9.4>7.8>8.7>8.4  Anticoagulation Plan: -INR Goal: 2-3 -ASA Dose: 81 mg  Gtts: Heparin 1200 units/hr - off 08/02/17  Device: -St Jude dual ICD -Therapies: ON at 200 bpm  Renal:  Creat trend: 7.39>8.71>5.25>3.80 HD - 08/01/17; 08/02/17  Plan/Recommendations:  1.  Call VAD pager for any VAD equipment issues.    Zada Girt RN, VAD Coordinator 24/7 pager [phone]

## 2017-08-03 NOTE — Plan of Care (Signed)
Continue current care plan, remains still nauseated, medication reevaluated by MD

## 2017-08-03 NOTE — Progress Notes (Signed)
Pt sound asleep after IV phenegran. Will f/u as time allows. Yves Dill CES, ACSM 3:05 PM 08/03/2017

## 2017-08-04 ENCOUNTER — Encounter (HOSPITAL_COMMUNITY): Payer: BLUE CROSS/BLUE SHIELD

## 2017-08-04 ENCOUNTER — Encounter: Payer: Self-pay | Admitting: Gastroenterology

## 2017-08-04 LAB — CBC
HEMATOCRIT: 28 % — AB (ref 39.0–52.0)
HEMOGLOBIN: 9 g/dL — AB (ref 13.0–17.0)
MCH: 25.6 pg — ABNORMAL LOW (ref 26.0–34.0)
MCHC: 32.1 g/dL (ref 30.0–36.0)
MCV: 79.8 fL (ref 78.0–100.0)
Platelets: 236 10*3/uL (ref 150–400)
RBC: 3.51 MIL/uL — AB (ref 4.22–5.81)
RDW: 18.9 % — ABNORMAL HIGH (ref 11.5–15.5)
WBC: 8.4 10*3/uL (ref 4.0–10.5)

## 2017-08-04 LAB — GLUCOSE, CAPILLARY: Glucose-Capillary: 88 mg/dL (ref 65–99)

## 2017-08-04 LAB — BASIC METABOLIC PANEL
Anion gap: 10 (ref 5–15)
BUN: 23 mg/dL — AB (ref 6–20)
CHLORIDE: 97 mmol/L — AB (ref 101–111)
CO2: 26 mmol/L (ref 22–32)
Calcium: 8 mg/dL — ABNORMAL LOW (ref 8.9–10.3)
Creatinine, Ser: 5.59 mg/dL — ABNORMAL HIGH (ref 0.61–1.24)
GFR calc non Af Amer: 11 mL/min — ABNORMAL LOW (ref >60)
GFR, EST AFRICAN AMERICAN: 12 mL/min — AB (ref >60)
Glucose, Bld: 86 mg/dL (ref 65–99)
POTASSIUM: 3.4 mmol/L — AB (ref 3.5–5.1)
SODIUM: 133 mmol/L — AB (ref 135–145)

## 2017-08-04 LAB — PROTIME-INR
INR: 3.2
Prothrombin Time: 32.5 seconds — ABNORMAL HIGH (ref 11.4–15.2)

## 2017-08-04 LAB — LACTATE DEHYDROGENASE: LDH: 218 U/L — ABNORMAL HIGH (ref 98–192)

## 2017-08-04 LAB — HEPATITIS B SURFACE ANTIGEN: HEP B S AG: NEGATIVE

## 2017-08-04 MED ORDER — PENTAFLUOROPROP-TETRAFLUOROETH EX AERO: 1.0000 "application " | INHALATION_SPRAY | CUTANEOUS | Status: DC | PRN

## 2017-08-04 MED ORDER — WARFARIN SODIUM 5 MG PO TABS: 5.0000 mg | ORAL_TABLET | Freq: Every day | ORAL | 6 refills | Status: DC

## 2017-08-04 MED ORDER — HEPARIN SODIUM (PORCINE) 1000 UNIT/ML DIALYSIS: 1000.0000 [IU] | INTRAMUSCULAR | Status: DC | PRN

## 2017-08-04 MED ORDER — METOCLOPRAMIDE HCL 5 MG PO TABS: 5.0000 mg | ORAL_TABLET | Freq: Three times a day (TID) | ORAL | 6 refills | Status: DC

## 2017-08-04 MED ORDER — SODIUM CHLORIDE 0.9 % IV SOLN: 100.0000 mL | INTRAVENOUS | Status: DC | PRN

## 2017-08-04 MED ORDER — LIDOCAINE HCL (PF) 1 % IJ SOLN: 5.0000 mL | INTRAMUSCULAR | Status: DC | PRN

## 2017-08-04 MED ORDER — HEPARIN SODIUM (PORCINE) 1000 UNIT/ML DIALYSIS: 3000.0000 [IU] | INTRAMUSCULAR | Status: DC | PRN

## 2017-08-04 MED ORDER — LORAZEPAM 0.5 MG PO TABS: 0.5000 mg | ORAL_TABLET | Freq: Two times a day (BID) | ORAL | 0 refills | Status: DC | PRN

## 2017-08-04 MED ORDER — HYDRALAZINE HCL 25 MG PO TABS: ORAL_TABLET | ORAL | 3 refills | Status: DC

## 2017-08-04 MED ORDER — LIDOCAINE-PRILOCAINE 2.5-2.5 % EX CREA: 1.0000 "application " | TOPICAL_CREAM | CUTANEOUS | Status: DC | PRN

## 2017-08-04 MED ORDER — ERYTHROMYCIN BASE 250 MG PO TABS: 250.0000 mg | ORAL_TABLET | Freq: Three times a day (TID) | ORAL | 0 refills | Status: AC

## 2017-08-04 MED ORDER — AMLODIPINE BESYLATE 5 MG PO TABS: 5.0000 mg | ORAL_TABLET | Freq: Every day | ORAL | 3 refills | Status: DC

## 2017-08-04 MED ORDER — ALTEPLASE 2 MG IJ SOLR: 2.0000 mg | Freq: Once | INTRAMUSCULAR | Status: DC | PRN

## 2017-08-04 MED ORDER — METOCLOPRAMIDE HCL 5 MG PO TABS: 5.0000 mg | ORAL_TABLET | Freq: Two times a day (BID) | ORAL | 3 refills | Status: DC

## 2017-08-04 NOTE — Progress Notes (Signed)
Pt discharged at 1340 via w/c with daugther and belongings. Discussed and explained discharge instructions, prescriptions anad follow up appt. Given to pt. Care note on gastroparesis given.

## 2017-08-04 NOTE — Progress Notes (Signed)
LVAD Coordinator Rounding Note:  Admitted 07/31/17 for intractable nausea/vomiting due to gastroparesis.  HM3LVAD implanted on 10/8/2018by Dr Elpidio [name] Destination Therapy criteria due to renal insufficiency.  Pt feeling much better and is ready to be discharged.   Vital signs: Temp: 98.7 HR:  88 Doppler: not done Auto cuff:  127/75 O2 Sat: 95 % on RA  Wt: 181>180 182>185>180>178>176 lbs   LVAD interrogation reveals:  Speed: 5600 Flow:  3.8 Power: 4.3w PI: 5.1 Alarms: none Events: none Hematocrit: 28 Fixed speed: 5600 Low speed limit: 5300  Back up black bag with controller, batteries, and extra clips at bedside.    Drive Line: Daily. Advanced to weekly dressings. Due Wednesday 08/09/17.  Labs:  LDH trend: 216>285>248>217>218  INR trend: 1.49>1.63>2.01>2.65>3.20 Hgb trend: 9.4>7.8>8.7>8.4>9.0  Anticoagulation Plan: -INR Goal: 2-3 -ASA Dose: 81 mg  Gtts: Heparin 1200 units/hr - off 08/02/17  Device: -St Jude dual ICD -Therapies: ON at 200 bpm  Renal:  Creat trend: 7.39>8.71>5.25>3.80>5.59 HD - 08/01/17; 08/02/17  Plan/Recommendations:  1.  Pt ok to discharge home. 2. Spoke with son Geanie Kenning. About Coumadin dose and all medications changes over the phone as he fills his Dads pill box. 3. Pt has follow up for Tuesday, pt states that he thinks he may have a dentist appt at this same time. Pt was instructed to call us Monday if so and we can reschedule him to a better time.    [name] RN, VAD Coordinator 24/7 pager [phone]

## 2017-08-04 NOTE — Progress Notes (Signed)
ANTICOAGULATION CONSULT NOTE - Initial Consult  Pharmacy Consult warfarin Indication: LVAD, h/o DVT, and factor V leiden  Allergies  Allergen Reactions  . Metformin And Related Diarrhea    Patient Measurements: Height: 5' 8"  (172.7 cm) Weight: 176 lb 5.9 oz (80 kg) IBW/kg (Calculated) : 68.4  Vital Signs: Temp: 98.7 F (37.1 C) (11/30 1059) Temp Source: Oral (11/30 1059) BP: 127/75 (11/30 1059) Pulse Rate: 83 (11/30 1059)  Labs: Recent Labs    08/01/17 1312  08/02/17 0300 08/03/17 0355 08/04/17 0217  HGB  --    < > 8.7* 8.4* 9.0*  HCT  --   --  27.9* 26.3* 28.0*  PLT  --   --  296 241 236  LABPROT  --   --  22.6* 28.0* 32.5*  INR  --   --  2.01 2.65 3.20  HEPARINUNFRC 0.37  --  0.32  --   --   CREATININE  --   --  5.25* 3.80* 5.59*   < > = values in this interval not displayed.    Estimated Creatinine Clearance: 15 mL/min (A) (by C-G formula based on SCr of 5.59 mg/dL (H)).   Medical History: Past Medical History:  Diagnosis Date  . Angina   . ASCVD (arteriosclerotic cardiovascular disease)    , Anterior infarction 2005, LAD diagonal bifurcation intervention 03/2004  . Automatic implantable cardiac defibrillator -St. Jude's       . Benign neoplasm of colon   . CHF (congestive heart failure) ([place])   . Chronic systolic heart failure ([place])   . Coronary artery disease     Widely patent previously placed stents in the left anterior   . Crohn's disease ([place])   . 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 Dyspnea   . Gastroparesis   . GERD (gastroesophageal reflux disease)   . High cholesterol   . Hyperlipidemia   . Hypersomnolent    Previous diagnosis of narcolepsy  . Hypertension, essential   . Ischemic cardiomyopathy    Ejection fraction 15-20% catheterization 2010  . Type II or unspecified type diabetes mellitus without mention of complication, not stated as uncontrolled   . Unspecified gastritis and gastroduodenitis without mention of  hemorrhage     Assessment: 51yo male c/o N/V, contacted LVAD coordinator who asked pt to report to ED, to continue Coumadin though INR is below goal >> to start heparin bridge.  Patient has an INR goal of 2-3 on a heartmate 3 LVAD and also because has factor V leiden, but with low Hgb, MD has lowered the goal to 2.0-2.5. No GI bleeding or any other overt bleeding, Hgb stable at 9.0.  INR supratherapeutic 3.20 this AM. Patient will be discharged today.   PTA dose: Warfarin 5 mg daily, except T/Thus 7.5 mg  Goal of Therapy:  INR 2.0-2.5 Monitor platelets by anticoagulation protocol: Yes   Plan:  Hold Coumadin tonight To resume Coumadin at 5 mg po daily at home Follow up in outpatient clinic next week if possible  Leroy Libman, PharmD Pharmacy Resident Pager: [phone]

## 2017-08-04 NOTE — Plan of Care (Signed)
Pt met all requirements, able to go home

## 2017-08-04 NOTE — Progress Notes (Signed)
[facility] Progress Note  Subjective: no new c/o  Vitals:   08/04/17 0930 08/04/17 1000 08/04/17 1030 08/04/17 1059  BP: 100/78 106/65 112/63 127/75  Pulse: 79 86 88 83  Resp: 17 18 17 18   Temp:    98.7 F (37.1 C)  TempSrc:    Oral  SpO2:    95%  Weight:    80 kg (176 lb 5.9 oz)  Height:        Inpatient medications: . amiodarone  200 mg Oral Daily  . amLODipine  5 mg Oral Daily  . aspirin EC  81 mg Oral Daily  . erythromycin  250 mg Oral TID AC  . hydrALAZINE  37.5 mg Oral Q8H  . insulin aspart  0-9 Units Subcutaneous TID WC  . metoCLOPramide  5 mg Oral TID AC & HS  . pantoprazole  40 mg Oral Q0600  . promethazine  12.5 mg Intravenous TID AC & HS  . sildenafil  20 mg Oral TID  . sodium chloride flush  3 mL Intravenous Q12H   . sodium chloride     sodium chloride, acetaminophen, hydrALAZINE, LORazepam, ondansetron (ZOFRAN) IV, sodium chloride flush  Exam: Gen alert, no distress No jvd or bruits Chest clear bilat RRR no MRG Abd soft ntnd , driveline in L mid abd for lvad Ext no LE edema Neuro alert, Ox 3 , nf  R IJ TDC    Dialysis: MWF [facility] 4h 85mn  81.5kg  3K/2Ca bath  Hep 8000 bolus   R IJ TDC - Venofer 102mx 10 ordered (thru 08/18/17 ) - Mircera 10053mIV q 2 weeks ordered (last 11/20 at OP unit) - Calcitriol 0.5mc1mO q HD      Impression: 1. Nausea/ vomiting - hx gastroparesis, improved,  per primary team 2. ESRD on HD MWF.  3. ICM/ chron syst CHF/ sp ICD/ sp LVAD - dc'ing IV hep, INR in range 4. Anemia of CKD - Hb 8- 10 range. Next esa due on 12/4. Cont IV fe load.  5. MBD of CKD - cont vdra/ binders 6. Vol - no vol excess, at dry wt 7. CAD hx of DES to LAD and RCA 8. HTN - on norvasc/ hydral as at home. Per cards. Vol is ok, at dry wt no excess  9. DM2 10. Hx DVT/ factor V hetero - on coumadin   Plan - HD today, no change in dry wt.    Rob Kelly [facility] pager [phone]/30/2018,  1:22 PM   Recent Labs  Lab 08/02/17 0300 08/03/17 0355 08/04/17 0217  NA 134* 133* 133*  K 4.1 3.3* 3.4*  CL 96* 97* 97*  CO2 24 26 26   GLUCOSE 119* 100* 86  BUN 28* 15 23*  CREATININE 5.25* 3.80* 5.59*  CALCIUM 8.4* 7.9* 8.0*   Recent Labs  Lab 07/31/17 0035  AST 16  ALT 15*  ALKPHOS 130*  BILITOT 0.8  PROT 7.0  ALBUMIN 2.8*   Recent Labs  Lab 07/31/17 0035  08/02/17 0300 08/03/17 0355 08/04/17 0217  WBC 8.7   < > 12.7* 8.7 8.4  NEUTROABS 7.3  --   --   --   --   HGB 9.4*   < > 8.7* 8.4* 9.0*  HCT 29.5*   < > 27.9* 26.3* 28.0*  MCV 79.1   < > 79.9 79.5 79.8  PLT 288   < > 296 241 236   < > = values  in this interval not displayed.   Iron/TIBC/Ferritin/ %Sat    Component Value Date/Time   IRON 57 08/01/2017 0219   TIBC 237 (L) 08/01/2017 0219   FERRITIN 241 08/01/2017 0219   IRONPCTSAT 24 08/01/2017 5053

## 2017-08-04 NOTE — Discharge Instructions (Signed)
Gastroparesis Gastroparesis, also called delayed gastric emptying, is a condition in which food takes longer than normal to empty from the stomach. The condition is usually long-lasting (chronic). What are the causes? This condition may be caused by:  An endocrine disorder, such as hypothyroidism or diabetes. Diabetes is the most common cause of this condition.  A nervous system disease, such as Parkinson disease or multiple sclerosis.  Cancer, infection, or surgery of the stomach or vagus nerve.  A connective tissue disorder, such as scleroderma.  Certain medicines.  In most cases, the cause is not known. What increases the risk? This condition is more likely to develop in:  People with certain disorders, including endocrine disorders, eating disorders, amyloidosis, and scleroderma.  People with certain diseases, including Parkinson disease or multiple sclerosis.  People with cancer or infection of the stomach or vagus nerve.  People who have had surgery on the stomach or vagus nerve.  People who take certain medicines.  Women.  What are the signs or symptoms? Symptoms of this condition include:  An early feeling of fullness when eating.  Nausea.  Weight loss.  Vomiting.  Heartburn.  Abdominal bloating.  Inconsistent blood glucose levels.  Lack of appetite.  Acid from the stomach coming up into the esophagus (gastroesophageal reflux).  Spasms of the stomach.  Symptoms may come and go. How is this diagnosed? This condition is diagnosed with tests, such as:  Tests that check how long it takes food to move through the stomach and intestines. These tests include: ? Upper gastrointestinal (GI) series. In this test, X-rays of the intestines are taken after you drink a liquid. The liquid makes the intestines show up better on the X-rays. ? Gastric emptying scintigraphy. In this test, scans are taken after you eat food that contains a small amount of radioactive  material. ? Wireless capsule GI monitoring system. This test involves swallowing a capsule that records information about movement through the stomach.  Gastric manometry. This test measures electrical and muscular activity in the stomach. It is done with a thin tube that is passed down the throat and into the stomach.  Endoscopy. This test checks for abnormalities in the lining of the stomach. It is done with a long, thin tube that is passed down the throat and into the stomach.  An ultrasound. This test can help rule out gallbladder disease or pancreatitis as a cause of your symptoms. It uses sound waves to take pictures of the inside of your body.  How is this treated? There is no cure for gastroparesis. This condition may be managed with:  Treatment of the underlying condition causing the gastroparesis.  Lifestyle changes, including exercise and dietary changes. Dietary changes can include: ? Changes in what and when you eat. ? Eating smaller meals more often. ? Eating low-fat foods. ? Eating low-fiber forms of high-fiber foods, such as cooked vegetables instead of raw vegetables. ? Having liquid foods in place of solid foods. Liquid foods are easier to digest.  Medicines. These may be given to control nausea and vomiting and to stimulate stomach muscles.  Getting food through a feeding tube. This may be done in severe cases.  A gastric neurostimulator. This is a device that is inserted into the body with surgery. It helps improve stomach emptying and control nausea and vomiting.  Follow these instructions at home:  Follow your health care provider's instructions about exercise and diet.  Take medicines only as directed by your health care provider. Contact a  health care provider if:  Your symptoms do not improve with treatment.  You have new symptoms. Get help right away if:  You have severe abdominal pain that does not improve with treatment.  You have nausea that does  not go away.  You cannot keep fluids down. This information is not intended to replace advice given to you by your health care provider. Make sure you discuss any questions you have with your health care provider. Document Released: 08/22/2005 Document Revised: 01/28/2016 Document Reviewed: 08/18/2014 Elsevier Interactive Patient Education  2018 [name].   Nausea and Vomiting, Adult Feeling sick to your stomach (nausea) means that your stomach is upset or you feel like you have to throw up (vomit). Feeling more and more sick to your stomach can lead to throwing up. Throwing up happens when food and liquid from your stomach are thrown up and out the mouth. Throwing up can make you feel weak and cause you to get dehydrated. Dehydration can make you tired and thirsty, make you have a dry mouth, and make it so you pee (urinate) less often. Older adults and people with other diseases or a weak defense system (immune system) are at higher risk for dehydration. If you feel sick to your stomach or if you throw up, it is important to follow instructions from your doctor about how to take care of yourself. Follow these instructions at home: Eating and drinking Follow these instructions as told by your doctor:  Take an oral rehydration solution (ORS). This is a drink that is sold at pharmacies and stores.  Drink clear fluids in small amounts as you are able, such as: ? Water. ? Ice chips. ? Diluted fruit juice. ? Low-calorie sports drinks.  Eat bland, easy-to-digest foods in small amounts as you are able, such as: ? Bananas. ? Applesauce. ? Rice. ? Low-fat (lean) meats. ? Toast. ? Crackers.  Avoid fluids that have a lot of sugar or caffeine in them.  Avoid alcohol.  Avoid spicy or fatty foods.  General instructions  Drink enough fluid to keep your pee (urine) clear or pale yellow.  Wash your hands often. If you cannot use soap and water, use hand sanitizer.  Make sure that all  people in your home wash their hands well and often.  Take over-the-counter and prescription medicines only as told by your doctor.  Rest at home while you get better.  Watch your condition for any changes.  Breathe slowly and deeply when you feel sick to your stomach.  Keep all follow-up visits as told by your doctor. This is important. Contact a doctor if:  You have a fever.  You cannot keep fluids down.  Your symptoms get worse.  You have new symptoms.  You feel sick to your stomach for more than two days.  You feel light-headed or dizzy.  You have a headache.  You have muscle cramps. Get help right away if:  You have pain in your chest, neck, arm, or jaw.  You feel very weak or you pass out (faint).  You throw up again and again.  You see blood in your throw-up.  Your throw-up looks like black coffee grounds.  You have bloody or black poop (stools) or poop that look like tar.  You have a very bad headache, a stiff neck, or both.  You have a rash.  You have very bad pain, cramping, or bloating in your belly (abdomen).  You have trouble breathing.  You are breathing  very quickly.  Your heart is beating very quickly.  Your skin feels cold and clammy.  You feel confused.  You have pain when you pee.  You have signs of dehydration, such as: ? Dark pee, hardly any pee, or no pee. ? Cracked lips. ? Dry mouth. ? Sunken eyes. ? Sleepiness. ? Weakness. These symptoms may be an emergency. Do not wait to see if the symptoms will go away. Get medical help right away. Call your local emergency services (911 in the [place] Do not drive yourself to the hospital. This information is not intended to replace advice given to you by your health care provider. Make sure you discuss any questions you have with your health care provider. Document Released: 02/08/2008 Document Revised: 03/11/2016 Document Reviewed: 04/28/2015 Elsevier Interactive Patient Education   2018 [name].

## 2017-08-04 NOTE — Discharge Summary (Signed)
Advanced Heart Failure Team  Discharge Summary   Patient ID: [name] MRN: [id number], DOB/AGE: 12/10/1965 51 y.o. Admit date: 07/31/2017 D/C date:     08/04/2017   Primary Discharge Diagnoses:  1. Nausea/Vomiting - Continue Reglan 5 mg tid/ac.   - Continue po erythromycin begun to promote motility, will continue for a week after discharge.    - Continue Protonix and prn Zofran 2. Chronic Systolic Heart Failure  3. ESRD 4. CAD: NSTEMI 11/2016 5. H/O DVTs 6. DMI 7. HTN 8. PAD 9. Anemia   Hospital Course:  [name] is a51 yomalewithgout, DM2,CAD s/p anterior MI with severe systolic HF (EF 61%) s/p St Jude ICD &Heartmate 3 LVAD and ESRD being admitted for recurrent intractable N/V due to gastroparesis. Improved addition of erythromycin and increased reglan. He will continue to be followed closely in the LVAD clinic. Follow up next week has been set up.   1. 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  Symptoms have recurred. GI consulted with recommendations below.    - Continue Reglan 5 mg tid/ac.   - po erythromycin begun to promote motility, will continue for a week after discharge.    - Continue Protonix, phenergan, and prn Zofran.  2. Chronic systolic CHF: Ischemic cardiomyopathy. St Jude ICD. NSTEMI in March 2018 with DES to LAD and RCA, complicated by cardiogenic shock, low output requiring milrinone. Echo 8/18 with EF 20-25%, moderate [name]. CPX (8/18) with severe functional impairment due to HF. He is now s/p HM3 LVAD placement.   Maps followed closely with Meds adjusted.   - Continue ASA 81 + coumadin with INR 2-2.5. INR high on the day discharge. Coumadin held and will be started tomorrow. Inpatient pharm d and outpatient pharm d coordinated coumadin dosing for  discharge.  3. ESRD: Developed peri-operatively after HM3 placement. He is tolerating HD without problems currently.  Continue Outpatient HD.  4. CAD: NSTEMI in 3/18. LHC with 99% ulcerated lesion proximal RCA with left to right collaterals, 95% mid LAD stenosis after mid LAD stent. s/p PCI to RCA and LAD on 11/25/16. No exertional chest pain.  - Continue statin and ASA. 5. h/o DVTs:Factor V Leiden heterozygote.On warfarin.  6. DM: Insulin sliding scale for now with poor po intake.  7. YWV:PXTGGYIR closely.  8. PAD: Moderate bilateral disease on ABIs in 8/18. Denies claudication currently. 9. Anemia: Stable hemoglobin.  No overt GI bleeding.  Fe studies normal.  - Renal disease likely plays a role, Aranesp per nephrology.    LVAD INTERROGATION:  HeartMate II LVAD:  Flow 4.2 liters/min, speed 5600, power 4.2, PI    Discharge Weight: 176 pounds. Discharge Vitals: Blood pressure 127/75, pulse 83, temperature 98.7 F (37.1 C), temperature source Oral, resp. rate 18, height 5' 8"  (1.727 m), weight 176 lb 5.9 oz (80 kg), SpO2 95 %.  Labs: Lab Results  Component Value Date   WBC 8.4 08/04/2017   HGB 9.0 (L) 08/04/2017   HCT 28.0 (L) 08/04/2017   MCV 79.8 08/04/2017   PLT 236 08/04/2017    Recent Labs  Lab 07/31/17 0035  08/04/17 0217  NA 136   < > 133*  K 4.2   < > 3.4*  CL 100*   < > 97*  CO2 24   < > 26  BUN 47*   < > 23*  CREATININE 7.39*   < >  5.59*  CALCIUM 8.9   < > 8.0*  PROT 7.0  --   --   BILITOT 0.8  --   --   ALKPHOS 130*  --   --   ALT 15*  --   --   AST 16  --   --   GLUCOSE 116*   < > 86   < > = values in this interval not displayed.   Lab Results  Component Value Date   CHOL 128 05/04/2017   HDL 50 05/04/2017   LDLCALC 54 05/04/2017   TRIG 118 05/04/2017   BNP (last 3 results) Recent Labs    06/18/17 2305 06/26/17 0004 07/03/17 0037  BNP 746.0* 1,409.3* 758.9*    ProBNP (last 3 results) No results for input(s): PROBNP in the last 8760  hours.   Diagnostic Studies/Procedures   No results found.  Discharge Medications   Allergies as of 08/04/2017      Reactions   Metformin And Related Diarrhea      Medication List    STOP taking these medications   nitroGLYCERIN 0.4 MG SL tablet Commonly known as:  NITROSTAT     TAKE these medications   amiodarone 200 MG tablet Commonly known as:  PACERONE Take 1 tablet (200 mg total) by mouth daily.   amLODipine 5 MG tablet Commonly known as:  NORVASC Take 1 tablet (5 mg total) by mouth daily. Take 1 tablet (4m) on Tuesday, Thursday, Saturday and Sunday (non HD days) by mouth What changed:    how much to take  how to take this  when to take this   aspirin EC 81 MG tablet Take 1 tablet (81 mg total) by mouth daily.   atorvastatin 40 MG tablet Commonly known as:  LIPITOR Take 1 tablet (40 mg total) daily at 6 PM by mouth.   docusate sodium 100 MG capsule Commonly known as:  COLACE Take 2 capsules (200 mg total) by mouth daily.   erythromycin 250 MG tablet Commonly known as:  E-MYCIN Take 1 tablet (250 mg total) by mouth 3 (three) times daily for 7 days.   hydrALAZINE 25 MG tablet Commonly known as:  APRESOLINE Take 37.5 mg three times a day on non hemodialysis days and in the evening on dialysis days. What changed:    how much to take  additional instructions   insulin aspart 100 UNIT/ML injection Commonly known as:  novoLOG Inject 3 Units into the skin 3 (three) times daily with meals.   insulin glargine 100 UNIT/ML injection Commonly known as:  LANTUS Inject 0.05 mLs (5 Units total) into the skin daily.   LORazepam 0.5 MG tablet Commonly known as:  ATIVAN Take 1 tablet (0.5 mg total) by mouth every 12 (twelve) hours as needed for anxiety or sleep.   metoCLOPramide 5 MG tablet Commonly known as:  REGLAN Take 1 tablet (5 mg total) by mouth 4 (four) times daily -  before meals and at bedtime. What changed:  when to take this   ondansetron 4  MG disintegrating tablet Commonly known as:  ZOFRAN ODT Take 1 tablet (4 mg total) by mouth every 6 (six) hours as needed for nausea or vomiting.   pantoprazole 40 MG tablet Commonly known as:  PROTONIX Take 40 mg by mouth daily.   sildenafil 20 MG tablet Commonly known as:  REVATIO Take 1 tablet (20 mg total) by mouth 3 (three) times daily.   warfarin 5 MG tablet Commonly known as:  COUMADIN  Take as directed. If you are unsure how to take this medication, talk to your nurse or doctor. Original instructions:  Take 1 tablet (5 mg total) by mouth at bedtime. Take 5 mg daily except 2.5 mg on Monday Start taking on:  08/05/2017 What changed:  additional instructions       Disposition   The patient will be discharged in stable condition to home. Discharge Instructions    Diet - low sodium heart healthy   Complete by:  As directed    Heart Failure patients record your daily weight using the same scale at the same time of day   Complete by:  As directed    INR  Goal: 2 - 2.5   Complete by:  As directed    Goal:  2 - 2.5   Increase activity slowly   Complete by:  As directed    Page VAD Coordinator at [phone]  Notify for: any VAD alarms, sustained elevations of power >10 watts, sustained drop in Pulse Index <3   Complete by:  As directed    Notify for:   any VAD alarms sustained elevations of power >10 watts sustained drop in Pulse Index <3     Speed Settings:   Complete by:  As directed    Fixed 5600 RPM Low 5300 RPM         Duration of Discharge Encounter: Greater than 35 minutes   Signed, Amy Clegg NP-C  08/04/2017, 11:53 AM

## 2017-08-04 NOTE — Care Management Note (Signed)
Case Management Note  Patient Details  Name: [name] MRN: [id number] Date of Birth: 1966-07-20  Subjective/Objective:     Pt is a readmit with vomiting and nausea - pt is a recent LVAD recipient               Action/Plan:   PTA independent from home with family.  Pt is active with [facility] per HF order set.  Pt is ESRD on outpt HD.  CM will continue to follow for discharge needs   Expected Discharge Date:  08/04/17               Expected Discharge Plan:  [name]  In-House Referral:     Discharge planning Services  CM Consult  Post Acute Care Choice:    Choice offered to:     DME Arranged:    DME Agency:     HH Arranged:    HH Agency:     Status of Service:     If discussed at [employer], dates discussed:    Additional Comments: 08/04/2017 Pt to discharge today home with [facility] resuming services.  Agency made aware that pt is discharging home today  Maryclare Labrador, RN 08/04/2017, 12:03 PM

## 2017-08-04 NOTE — Progress Notes (Signed)
Patient ID: [name], male   DOB: [birth year], 51 y.o.   MRN: [id number] P  Advanced Heart Failure VAD Team Note  Subjective:    Doing better.  Ate a muffin for breakfast.  No nausea.   MAP elevated still in 90s-100s but he is taking pills again.   Hemoglobin higher than at admission, not Fe deficient and had brown stool.   He has been tolerating HD without problems.   LVAD INTERROGATION:  HeartMate II LVAD:  Flow 4.2 liters/min, speed 5600, power 4.2, PI 3.5.    Objective:    Vital Signs:   Temp:  [97.4 F (36.3 C)-98.8 F (37.1 C)] 98.8 F (37.1 C) (11/30 0700) Pulse Rate:  [82-110] (P) 82 (11/30 0900) Resp:  [15-23] (P) 16 (11/30 0900) BP: (110-130)/(73-96) (P) 103/68 (11/30 0900) SpO2:  [93 %-99 %] 96 % (11/30 0700) Weight:  [178 lb 1.6 oz (80.8 kg)-179 lb 7.3 oz (81.4 kg)] 179 lb 7.3 oz (81.4 kg) (11/30 0700) Last BM Date: 08/02/17 Mean arterial Pressure  90s-100s  Intake/Output:   Intake/Output Summary (Last 24 hours) at 08/04/2017 1002 Last data filed at 08/03/2017 2230 Gross per 24 hour  Intake 500 ml  Output -  Net 500 ml     Physical Exam    GENERAL: Well appearing this am. NAD.  HEENT: Normal. NECK: Supple, JVP 7-8 cm. Carotids OK.  CARDIAC:  Mechanical heart sounds with LVAD hum present.  LUNGS:  CTAB, normal effort.  ABDOMEN:  NT, ND, no HSM. No bruits or masses. +BS  LVAD exit site: Well-healed and incorporated. Dressing dry and intact. No erythema or drainage. Stabilization device present and accurately applied. Driveline dressing changed daily per sterile technique. EXTREMITIES:  Warm and dry. No cyanosis, clubbing, rash, or edema.  NEUROLOGIC:  Alert & oriented x 3. Cranial nerves grossly intact. Moves all 4 extremities w/o difficulty. Affect pleasant     Telemetry   NSR in 80s-90s (personally reviewed)  Labs   Basic Metabolic Panel: Recent Labs  Lab 07/31/17 0405 08/01/17 0219 08/02/17 0300 08/03/17 0355 08/04/17 0217  NA  136 137 134* 133* 133*  K 4.6 5.2* 4.1 3.3* 3.4*  CL 102 103 96* 97* 97*  CO2 18* 19* 24 26 26   GLUCOSE 152* 141* 119* 100* 86  BUN 48* 61* 28* 15 23*  CREATININE 7.33* 8.71* 5.25* 3.80* 5.59*  CALCIUM 8.6* 8.8* 8.4* 7.9* 8.0*    Liver Function Tests: Recent Labs  Lab 07/31/17 0035  AST 16  ALT 15*  ALKPHOS 130*  BILITOT 0.8  PROT 7.0  ALBUMIN 2.8*   No results for input(s): LIPASE, AMYLASE in the last 168 hours. No results for input(s): AMMONIA in the last 168 hours.  CBC: Recent Labs  Lab 07/31/17 0035 07/31/17 0405 08/01/17 0219 08/02/17 0300 08/03/17 0355 08/04/17 0217  WBC 8.7 11.0* 10.4 12.7* 8.7 8.4  NEUTROABS 7.3  --   --   --   --   --   HGB 9.4* 8.4* 7.8* 8.7* 8.4* 9.0*  HCT 29.5* 26.3* 24.4* 27.9* 26.3* 28.0*  MCV 79.1 79.7 78.5 79.9 79.5 79.8  PLT 288 278 288 296 241 236    INR: Recent Labs  Lab 07/31/17 0035 08/01/17 0219 08/02/17 0300 08/03/17 0355 08/04/17 0217  INR 1.49 1.63 2.01 2.65 3.20    Other results:  EKG:    Imaging   No results found.   Medications:     Scheduled Medications: . amiodarone  200 mg Oral  Daily  . amLODipine  5 mg Oral Daily  . aspirin EC  81 mg Oral Daily  . erythromycin  250 mg Oral TID AC  . hydrALAZINE  37.5 mg Oral Q8H  . insulin aspart  0-9 Units Subcutaneous TID WC  . metoCLOPramide  5 mg Oral TID AC & HS  . pantoprazole  40 mg Oral Q0600  . promethazine  12.5 mg Intravenous TID AC & HS  . sildenafil  20 mg Oral TID  . sodium chloride flush  3 mL Intravenous Q12H  . Warfarin - Pharmacist Dosing Inpatient   Does not apply q1800    Infusions: . sodium chloride    . sodium chloride    . sodium chloride    . sodium chloride    . sodium chloride      PRN Medications: sodium chloride, sodium chloride, sodium chloride, sodium chloride, sodium chloride, acetaminophen, alteplase, heparin, [START ON 08/05/2017] heparin, hydrALAZINE, lidocaine (PF), lidocaine-prilocaine, LORazepam, ondansetron  (ZOFRAN) IV, pentafluoroprop-tetrafluoroeth, sodium chloride flush   Patient Profile   [name] is a 51 yo male with gout, DM2, CAD s/p anterior MI with severe systolic HF (EF 32%) s/p St Jude ICD & Heartmate 3 LVAD and ESRD being admitted for recurrent intractable N/V due to gastroparesis.  Assessment/Plan:    1. 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 He improved considerably with scheduled Reglan and erythromycin and was discharged on 07/16/17.  Symptoms have recurred.  Appreciate GI evaluation.  He is doing better today, nausea seems to have resolved.  Ate breakfast. - Continue Reglan 5 mg tid/ac.   - po erythromycin begun to promote motility, will continue for a week after discharge.    - Continue Protonix and prn Zofran.  2. Chronic systolic CHF: Ischemic cardiomyopathy. St Jude ICD. NSTEMI in March 2018 with DES to LAD and RCA, complicated by cardiogenic shock, low output requiring milrinone. Echo 8/18 with EF 20-25%, moderate [name]. CPX (8/18) with severe functional impairment due to HF. He is now s/p HM3 LVAD placement.  MAP remains elevated but now able to take his pills.  - Continue amlodipine, increased hydralazine to 37.5 mg tid on non-HD days.   - Continue ASA 81 + coumadin with INR 2-2.5. 3. ESRD: Developed peri-operatively after HM3 placement.  He is tolerating HD without problems currently.  4. CAD: NSTEMI in 3/18. LHC with 99% ulcerated lesion proximal RCA with left to right collaterals, 95% mid LAD stenosis after mid LAD stent. s/p PCI to RCA and LAD on 11/25/16. No exertional chest pain.  - Continue statin and ASA. 5. h/o DVTs: Factor V Leiden heterozygote.  On warfarin.  6. DM: Insulin sliding scale for now with poor po intake.  7. HTN: MAP still high but improved, continue current regimen.   8. PAD: Moderate bilateral disease on ABIs in 8/18.  Denies claudication  currently. 9. Anemia: Stable hemoglobin.  No overt GI bleeding.  Fe studies normal.  - Renal disease likely plays a role, Aranesp per nephrology.  - Guaiac stool.  - Transfuse hgb < 7.  10. Disposition: I think that he can go home today.  Followup LVAD clinic. Meds for home: amlodipine 5 mg daily, hydralazine 37.5 mg tid on non-HD days and in the evening on HD days, warfarin goal 2-2.5, ASA 81, erythromycin 250 tid ac x 7 days, Reglan 5 mg qac/hs, protonix 40 daily, Revatio 20 mg tid.  Other meds  as prior to admission.   I reviewed the LVAD parameters from today, and compared the results to the patient's prior recorded data.  No programming changes were made.  The LVAD is functioning within specified parameters.  The patient performs LVAD self-test daily.  LVAD interrogation was negative for any significant power changes, alarms or PI events/speed drops.  LVAD equipment check completed and is in good working order.  Back-up equipment present.   LVAD education done on emergency procedures and precautions and reviewed exit site care.  Length of Stay: 4  Loralie Champagne, MD 08/04/2017, 10:02 AM  VAD Team --- VAD ISSUES ONLY--- Pager [phone] (7am - 7am)  Advanced Heart Failure Team  Pager [phone] (M-F; 7a - 4p)  Please contact [facility] for night-coverage after hours (4p -7a ) and weekends on [email or web address]

## 2017-08-07 ENCOUNTER — Other Ambulatory Visit (HOSPITAL_COMMUNITY): Payer: Self-pay | Admitting: *Deleted

## 2017-08-07 ENCOUNTER — Encounter (HOSPITAL_COMMUNITY): Payer: BLUE CROSS/BLUE SHIELD

## 2017-08-07 DIAGNOSIS — Z7901 Long term (current) use of anticoagulants: Secondary | ICD-10-CM

## 2017-08-07 DIAGNOSIS — I5043 Acute on chronic combined systolic (congestive) and diastolic (congestive) heart failure: Secondary | ICD-10-CM

## 2017-08-07 DIAGNOSIS — Z95811 Presence of heart assist device: Secondary | ICD-10-CM

## 2017-08-07 DIAGNOSIS — N186 End stage renal disease: Secondary | ICD-10-CM

## 2017-08-08 ENCOUNTER — Other Ambulatory Visit (HOSPITAL_COMMUNITY): Payer: Self-pay | Admitting: Pharmacist

## 2017-08-08 ENCOUNTER — Ambulatory Visit (HOSPITAL_COMMUNITY)
Admission: RE | Admit: 2017-08-08 | Discharge: 2017-08-08 | Disposition: A | Discharge status: Home / Self Care | Payer: BLUE CROSS/BLUE SHIELD | Source: Ambulatory Visit | Attending: Cardiology | Admitting: Cardiology

## 2017-08-08 VITALS — BP 120/0 | HR 86 | Ht 68.0 in | Wt 179.8 lb

## 2017-08-08 DIAGNOSIS — N186 End stage renal disease: Secondary | ICD-10-CM | POA: Diagnosis not present

## 2017-08-08 DIAGNOSIS — I252 Old myocardial infarction: Secondary | ICD-10-CM | POA: Insufficient documentation

## 2017-08-08 DIAGNOSIS — Z992 Dependence on renal dialysis: Secondary | ICD-10-CM | POA: Diagnosis not present

## 2017-08-08 DIAGNOSIS — E1143 Type 2 diabetes mellitus with diabetic autonomic (poly)neuropathy: Secondary | ICD-10-CM

## 2017-08-08 DIAGNOSIS — R110 Nausea: Secondary | ICD-10-CM

## 2017-08-08 DIAGNOSIS — I132 Hypertensive heart and chronic kidney disease with heart failure and with stage 5 chronic kidney disease, or end stage renal disease: Secondary | ICD-10-CM | POA: Insufficient documentation

## 2017-08-08 DIAGNOSIS — Z86718 Personal history of other venous thrombosis and embolism: Secondary | ICD-10-CM | POA: Diagnosis not present

## 2017-08-08 DIAGNOSIS — Z95811 Presence of heart assist device: Secondary | ICD-10-CM

## 2017-08-08 DIAGNOSIS — K3184 Gastroparesis: Secondary | ICD-10-CM | POA: Diagnosis not present

## 2017-08-08 DIAGNOSIS — Z794 Long term (current) use of insulin: Secondary | ICD-10-CM | POA: Diagnosis not present

## 2017-08-08 DIAGNOSIS — Z7901 Long term (current) use of anticoagulants: Secondary | ICD-10-CM | POA: Diagnosis not present

## 2017-08-08 DIAGNOSIS — D6851 Activated protein C resistance: Secondary | ICD-10-CM | POA: Insufficient documentation

## 2017-08-08 DIAGNOSIS — I255 Ischemic cardiomyopathy: Secondary | ICD-10-CM | POA: Diagnosis not present

## 2017-08-08 DIAGNOSIS — K5090 Crohn's disease, unspecified, without complications: Secondary | ICD-10-CM | POA: Insufficient documentation

## 2017-08-08 DIAGNOSIS — K219 Gastro-esophageal reflux disease without esophagitis: Secondary | ICD-10-CM | POA: Diagnosis not present

## 2017-08-08 DIAGNOSIS — I2510 Atherosclerotic heart disease of native coronary artery without angina pectoris: Secondary | ICD-10-CM | POA: Insufficient documentation

## 2017-08-08 DIAGNOSIS — Z7982 Long term (current) use of aspirin: Secondary | ICD-10-CM | POA: Diagnosis not present

## 2017-08-08 DIAGNOSIS — Z95810 Presence of automatic (implantable) cardiac defibrillator: Secondary | ICD-10-CM | POA: Diagnosis not present

## 2017-08-08 DIAGNOSIS — N185 Chronic kidney disease, stage 5: Secondary | ICD-10-CM

## 2017-08-08 DIAGNOSIS — E785 Hyperlipidemia, unspecified: Secondary | ICD-10-CM | POA: Diagnosis not present

## 2017-08-08 DIAGNOSIS — E1122 Type 2 diabetes mellitus with diabetic chronic kidney disease: Secondary | ICD-10-CM | POA: Diagnosis not present

## 2017-08-08 DIAGNOSIS — I5022 Chronic systolic (congestive) heart failure: Secondary | ICD-10-CM | POA: Diagnosis not present

## 2017-08-08 DIAGNOSIS — I10 Essential (primary) hypertension: Secondary | ICD-10-CM | POA: Diagnosis not present

## 2017-08-08 DIAGNOSIS — Z79899 Other long term (current) drug therapy: Secondary | ICD-10-CM | POA: Insufficient documentation

## 2017-08-08 MED ORDER — PROMETHAZINE HCL 25 MG PO TABS: 25.0000 mg | ORAL_TABLET | Freq: Four times a day (QID) | ORAL | 0 refills | Status: DC | PRN

## 2017-08-08 MED ORDER — HYDRALAZINE HCL 50 MG PO TABS: 50.0000 mg | ORAL_TABLET | Freq: Three times a day (TID) | ORAL | 6 refills | Status: DC

## 2017-08-08 NOTE — Patient Instructions (Signed)
1.  Increase Hydralazine to 50 mg three times daily on non-dialysis days (you may use current pills and take 2 tabs three times daily on non-dialysis days). New prescription will be for 50 mg tabs (you will take one tablet three times daily on non-dialysis days).  2.  May take Phenergan 25 mg every 6 hrs as needed for nausea.  3.  Return to [place] clinic in 2 weeks.  4.  We will call dialysis center and ask if they can draw your labs there.

## 2017-08-08 NOTE — Progress Notes (Deleted)
    Postoperative Access Visit   History of Present Illness   [name] is a 51 y.o. year old male who presents for postoperative follow-up for: right first stage brachial vein transposition (Date: 06/26/17).  The patient's wounds are *** healed.  The patient notes *** steal symptoms.  The patient is *** able to complete their activities of daily living.  The patient's current symptoms are: ***.   Physical Examination  ***There were no vitals filed for this visit. ***There is no height or weight on file to calculate BMI.  right arm Incision is *** healed, skin feels ***, hand grip is ***/5, sensation in digits is *** intact, ***palpable thrill, bruit can *** be auscultated     Medical Decision Making   [name] is a 51 y.o. year old male who presents s/p right first stage brachial vein transposition   The patient's access is *** ready for use.  ***The patient's tunneled dialysis catheter can be removed after two successful cannulations and completed dialysis treatments.  Thank you for allowing [place] to participate in this patient's care.   Adele Barthel, MD, FACS [facility] Office: [phone] Pager: [phone]

## 2017-08-08 NOTE — Progress Notes (Signed)
Cardiology: Dr. Aundra Dubin  HPI: 51 yo with history of CAD s/p anterior MI in 2010 and NSTEMI in 3/18 with DES to pRCA and mLAD and ischemic cardiomyopathy now s/p Heartmate 3 LVAD and ESRD presents for followup of CHF/LVAD.  Patient developed low output heart failure.  He was not deemed to be a good transplant candidate.  He was admitted in 10/18 and Heartmate 3 LVAD was placed.  He had baseline CKD stage 3 likely from diabetic nephropathy and CHF.  He developed peri-op AKI and ended up on dialysis.  No renal recovery to date.  St Jude ICD has been turned back on and ramp echo was done prior to discharge in 10/18, speed increased to 5600 rpm.   GI Issues  Gastroparesis Hospital Admit 07/31/17 --> Continue Reglan 5 mg tid/ac. Continue po erythromycin begun to promote motility, will continue for a week after discharge.Continue Protonix and prn [place] 07/10/2017--> EGD 11/7 concerning for gastroparesis. Treated with erythromycin as an inpatient.    Today he returns for post hospital follow up.Overall feeling fine. Denies SOB/PND/Orthopnea. Appetite ok. No fever or chills. Weight at home 175-178 pounds. Tolerating HD three days a week. No BRBPR. Tolerating food. Tries to eat small frequent meals. Occasional says he has some nausea.  Taking all medications. Unable to draw labs  Denies LVAD alarms.  Denies driveline trauma, erythema or drainage.  Denies ICD shocks.   Reports taking Coumadin as prescribed and adherence to anticoagulation based dietary restrictions.  Denies bright red blood per rectum or melena, no dark urine or hematuria.    VAD Parameters: Flow 4 Speed 5600 PI 3.8 Power 4.1  No alarms or PI events.   Labs (11/18): hgb 10.4 => 8.7, LDH 213  PMH: 1. Chronic systolic CHF: Ischemic cardiomyopathy. St Jude ICD.  - Echo (11/17) with EF 25-30%, moderate diastolic dysfunction, mild MR, mildly dilated RV with moderately decreased systolic function, peak RV-RA gradient 48  mmHg.  - Echo (3/18) with EF 25-30%, wall motion abnormalities, normal RV size and systolic function.  - Admission 3/18 with cardiogenic shock and AKI, required milrinone gtt.  - Echo (8/18) with EF 20-25%, mild LVH, moderate LV dilation, mild MR.  - CPX (8/18) with peak VO2 12.4, VE/VCO2 slope 35, RER 1.05 => severe HR limitation.  - Heartmate 3 LVAD 10/18.  2. CAD: Anterior MI 2005, had bifurcation stenting LAD/diagonal. Repeat cath 2010 with patent stents.  - NSTEMI 3/18: LHC with 99% ulcerated lesion proximal RCA, 95% mid LAD => PCI with DES to proximal RCA and mid LAD.  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  - Lower extremity venous dopplers (8/18): chronic DVT - V/Q scan (8/18): Normal, no PE.  4. Crohns disease: History of colectomy.  5. GERD 6. Diabetic gastroparesis: Admission 11/18 with abdominal pain, nausea/vomiting.  7. HTN 8. Hyperlipidemia 9. Type II diabetes  10. ESRD: Post-op LVAD surgery.  11. Carotid dopplers (8/18): mild bilateral stenosis.  12. PFTs (8/18): Mild restriction (no IDL on CT chest).  13. Lung nodules: CT chest 8/18 with small bilateral nodules.  14. PAD: ABIs with moderate bilateral disease in 8/18.   Current Outpatient Medications  Medication Sig Dispense Refill  . amiodarone (PACERONE) 200 MG tablet Take 1 tablet (200 mg total) by mouth daily. 30 tablet 5  . amLODipine (NORVASC) 5 MG tablet Take 1 tablet (5 mg total) by mouth daily. Take 1 tablet (37m) on Tuesday, Thursday, Saturday and Sunday (non HD days) by mouth 30 tablet  3  . aspirin EC 81 MG tablet Take 1 tablet (81 mg total) by mouth daily. 90 tablet 3  . atorvastatin (LIPITOR) 40 MG tablet Take 1 tablet (40 mg total) daily at 6 PM by mouth. 30 tablet 6  . docusate sodium (COLACE) 100 MG capsule Take 2 capsules (200 mg total) by mouth daily. 10 capsule 0  . erythromycin (E-MYCIN) 250 MG tablet Take 1 tablet (250 mg total) by mouth 3 (three) times daily for 7 days. 21 tablet 0  . hydrALAZINE  (APRESOLINE) 50 MG tablet Take 1 tablet (50 mg total) by mouth 3 (three) times daily. 90 tablet 6  . insulin aspart (NOVOLOG) 100 UNIT/ML injection Inject 3 Units into the skin 3 (three) times daily with meals. 1 vial 0  . insulin glargine (LANTUS) 100 UNIT/ML injection Inject 0.05 mLs (5 Units total) into the skin daily. 10 mL 11  . LORazepam (ATIVAN) 0.5 MG tablet Take 1 tablet (0.5 mg total) by mouth every 12 (twelve) hours as needed for anxiety or sleep. 30 tablet 0  . metoCLOPramide (REGLAN) 5 MG tablet Take 1 tablet (5 mg total) by mouth 4 (four) times daily -  before meals and at bedtime. 130 tablet 6  . pantoprazole (PROTONIX) 40 MG tablet Take 40 mg by mouth daily.    . sildenafil (REVATIO) 20 MG tablet Take 1 tablet (20 mg total) by mouth 3 (three) times daily. 90 tablet 6  . warfarin (COUMADIN) 5 MG tablet Take 1 tablet (5 mg total) by mouth at bedtime. Take 5 mg daily except 2.5 mg on Monday 45 tablet 6  . ondansetron (ZOFRAN ODT) 4 MG disintegrating tablet Take 1 tablet (4 mg total) by mouth every 6 (six) hours as needed for nausea or vomiting. (Patient not taking: Reported on 08/08/2017) 40 tablet 1  . promethazine (PHENERGAN) 25 MG tablet Take 1 tablet (25 mg total) by mouth every 6 (six) hours as needed for nausea or vomiting. 30 tablet 0   No current facility-administered medications for this encounter.     Metformin and related  REVIEW OF SYSTEMS: All systems negative except as listed in HPI, PMH and Problem list.   LVAD INTERROGATION: See LVAD nurse's note above.   I reviewed the LVAD parameters from today, and compared the results to the patient's prior recorded data.  No programming changes were made.  The LVAD is functioning within specified parameters.  The patient performs LVAD self-test daily.  LVAD interrogation was negative for any significant power changes, alarms or PI events/speed drops.  LVAD equipment check completed and is in good working order.  Back-up equipment  present.   LVAD education done on emergency procedures and precautions and reviewed exit site care.    Vitals:   08/08/17 1013 08/08/17 1014  BP: 114/72 (!) 120/0  Pulse: 86   SpO2: 98%   Weight: 179 lb 12.8 oz (81.6 kg)   Height: 5' 8"  (1.727 m)      Physical Exam: GENERAL: Well appearing, male who presents to clinic today in no acute distress. Daughter present.  HEENT: normal  NECK: Supple, JVP 5-6 .  2+ bilaterally, no bruits.  No lymphadenopathy or thyromegaly appreciated.   CARDIAC:  Mechanical heart sounds with LVAD hum present. R upper chest HD catheter. LUNGS:  Clear to auscultation bilaterally.  ABDOMEN:  Soft, round, nontender, positive bowel sounds x4.     LVAD exit site: well-healed and incorporated.  Dressing dry and intact.  No erythema or drainage.  Stabilization device present and accurately applied.  Driveline dressing is being changed daily per sterile technique. EXTREMITIES:  Warm and dry, no cyanosis, clubbing, rash or edema > RUE AVF NEUROLOGIC:  Alert and oriented x 4.  Gait steady.  No aphasia.  No dysarthria.  Affect pleasant.     .   ASSESSMENT AND PLAN: 1. Chronic systolic CHF: Ischemic cardiomyopathy.  St Jude ICD.  NSTEMI in March 2018 with DES to LAD and RCA, complicated by cardiogenic shock, low output requiring milrinone.  Echo 8/18 with EF 20-25%, moderate MR.  PX (8/18) with severe functional impairment due to HF.  He is now s/p HM3 LVAD placement. LVAD parameters reviewed today and stable.  No complaints.  Map high today. Increase hydralazine to 50 mg three times a day on non HD days.  - Continue asa 81 mg daily.  -  Continue warfarin INR 2-3 for now with no overt GI bleeding.  - Plan to start cardiac rehab in the next couple of week.   2. ESRD: Developed peri-operatively after HM3 placement.  He is tolerating HD without problems currently.  3. CAD: NSTEMI in 3/18. LHC with 99% ulcerated lesion proximal RCA with left to right collaterals, 95% mid LAD  stenosis after mid LAD stent. s/p PCI to RCA and LAD on 11/25/16.  No exertional chest pain.  - Continue statin and ASA. 4. h/o DVTs: Factor V Leiden heterozygote.  On warfarin.  5. Diabetic gastroparesis: Quiescent.  6. DM: Per internal medicine.  7. HTN: Elevated as above. Increase hydralazine .  8. PAD: Moderate bilateral disease on ABIs in 8/18.  Denies claudication currently.  9. Gastroparesis- Seems stable for now. Continue erythromycin to complete 7 days. Contine reglan 5 mg tid/ac. Continue current dose of protonix. Add phenergan 25 mg as needed. Zofran does not work for him.   Unable to obtain labs today. Will ask HD center to check labs when he goes to dialysis tomorrow.  Follow up in 2 weeks. Set up cardiac rehab next visit. He is ready to start.  Greater than 50% of the 45 minutes visit spent in counseling/coordination of care regarding medication adjustments.   Tekeya Geffert NP-C  08/08/2017

## 2017-08-08 NOTE — Progress Notes (Signed)
Patient presents for hospital discharge f/u in [facility] today. Recent hospitalization from 11/26 - 08/04/17 for N&V due to gastroparesis. Reports no problems with VAD equipment or concerns with drive line. States he has had "some nausea, not every day" and has been able to take all medications since discharge. He is eating, but he "gets full" quickly. Daughter says he is not eating a lot. Encouraged him to eat small frequent meals, move around as much as possible to promote gastric motility.   Vital Signs:  Doppler Pressure 120  Automatc BP: 114/72 (99) HR: 86  SPO2: 98%  Weight: 179.8 lb w/o eqt Last weight: 183.2 lb Hospital d/c wt:  176 lbs  VAD interrogation & Equipment Management: Speed:4.0 Flow: 5600 Power:4.1 w    PI: 3.8 Hct: 28  Alarms: no clinical alarms Events: none  Fixed speed 5600 Low speed limit: 5300  Primary Controller: Replace back up battery in  months. Back up controller: Replace back up battery in months.  Annual Equipment Maintenance on UBC/MPU was performed in 12/2016.  I reviewed the LVAD parameters from todayand compared the results to the patient's prior recorded data.LVAD interrogation was NEGATIVEfor significant power changes, NEGATIVEfor clinicalalarms and STABLEfor PI events/speed drops. No programming changes were madeand pump is functioning within specified parameters. Pt is performing daily controller and system monitor self tests along with completing weekly and monthly maintenance for LVAD equipment.  LVAD equipment check completed and is in good working order. Back-up equipment present.   Exit Site Care: Drive line is being maintained weeklyby his daughter. Drive line exit site well healed and incorporated. The velour is fully implanted at exit site. Dressing dry and intact. No erythema or drainage. Stabilization device present and accurately applied. Pt denies fever or chills. Pt given 2 weekly kits and extra anchors.    Significant Events on VAD Support: - CT of abd for increase N/V 07/11/17   Anticoagulation: INR goal:  2.0 - 3.0 ASA:  81 mg daily  ICD: St Jude dual ICD Therapy on: 200 bpm Monitor on: slow VT 150 bpm  BP &Labs:  MAP 120- Doppler is reflecting MAP  Hgb - unable to draw blood - No S/S of bleeding. Specifically denies melena/BRBPR or nosebleeds.  LDH - unable to draw today. Established baseline of 200- 300. Denies tea-colored urine. No power elevations noted on interrogation.   Patient Instructions: 1.  Increase Hydralazine to 50 mg three times daily on non-dialysis days (you may use current pills and take 2 tabs three times daily on non-dialysis days). New prescription will be for 50 mg tabs (you will take one tablet three times daily on non-dialysis days). 2.  May take Phenergan 25 mg every 6 hrs as needed for nausea. 3.  Return to [place] clinic in 2 weeks. 4.  We will call dialysis center and ask if they can draw your labs there.   Zada Girt RN [place] Coordinator   Office: [phone] 24/7 Emergency VAD Pager: [phone]

## 2017-08-08 NOTE — Addendum Note (Signed)
Encounter addended by: Lezlie Octave, RN on: 08/08/2017 4:31 PM  Actions taken: Order Reconciliation Section accessed, Sign clinical note

## 2017-08-09 ENCOUNTER — Encounter: Payer: Self-pay | Admitting: Internal Medicine

## 2017-08-09 ENCOUNTER — Ambulatory Visit (INDEPENDENT_AMBULATORY_CARE_PROVIDER_SITE_OTHER): Payer: BLUE CROSS/BLUE SHIELD | Admitting: Internal Medicine

## 2017-08-09 ENCOUNTER — Encounter (HOSPITAL_COMMUNITY): Payer: BLUE CROSS/BLUE SHIELD

## 2017-08-09 VITALS — BP 130/90 | HR 82 | Temp 97.7°F | Ht 68.0 in | Wt 185.8 lb

## 2017-08-09 DIAGNOSIS — Z23 Encounter for immunization: Secondary | ICD-10-CM | POA: Diagnosis not present

## 2017-08-09 DIAGNOSIS — Z794 Long term (current) use of insulin: Secondary | ICD-10-CM

## 2017-08-09 DIAGNOSIS — E118 Type 2 diabetes mellitus with unspecified complications: Secondary | ICD-10-CM | POA: Diagnosis not present

## 2017-08-09 NOTE — Patient Instructions (Signed)
Mr. Isip,  It sounds like your sugars are under fairly good control. I expect there will be an improvement in your A1c when we check it next month.  Your goal fasting blood sugar in the morning is around 110. If your morning blood sugar is above 150, I would add 1 unit of lantus. For example, you currently take 5 units of lantus daily. You could increase this to 6 units tomorrow if your morning sugar is above 150. I would not increase above 10 units until we see you back.  Please see Dr. Valentina Lucks for the no-stick glucometer.  You received the TDAP vaccine today.  Best, Dr. Ola Spurr   Gastroparesis Gastroparesis, also called delayed gastric emptying, is a condition in which food takes longer than normal to empty from the stomach. The condition is usually long-lasting (chronic). What are the causes? This condition may be caused by:  An endocrine disorder, such as hypothyroidism or diabetes. Diabetes is the most common cause of this condition.  A nervous system disease, such as Parkinson disease or multiple sclerosis.  Cancer, infection, or surgery of the stomach or vagus nerve.  A connective tissue disorder, such as scleroderma.  Certain medicines.  In most cases, the cause is not known. What increases the risk? This condition is more likely to develop in:  People with certain disorders, including endocrine disorders, eating disorders, amyloidosis, and scleroderma.  People with certain diseases, including Parkinson disease or multiple sclerosis.  People with cancer or infection of the stomach or vagus nerve.  People who have had surgery on the stomach or vagus nerve.  People who take certain medicines.  Women.  What are the signs or symptoms? Symptoms of this condition include:  An early feeling of fullness when eating.  Nausea.  Weight loss.  Vomiting.  Heartburn.  Abdominal bloating.  Inconsistent blood glucose levels.  Lack of appetite.  Acid from the  stomach coming up into the esophagus (gastroesophageal reflux).  Spasms of the stomach.  Symptoms may come and go. How is this diagnosed? This condition is diagnosed with tests, such as:  Tests that check how long it takes food to move through the stomach and intestines. These tests include: ? Upper gastrointestinal (GI) series. In this test, X-rays of the intestines are taken after you drink a liquid. The liquid makes the intestines show up better on the X-rays. ? Gastric emptying scintigraphy. In this test, scans are taken after you eat food that contains a small amount of radioactive material. ? Wireless capsule GI monitoring system. This test involves swallowing a capsule that records information about movement through the stomach.  Gastric manometry. This test measures electrical and muscular activity in the stomach. It is done with a thin tube that is passed down the throat and into the stomach.  Endoscopy. This test checks for abnormalities in the lining of the stomach. It is done with a long, thin tube that is passed down the throat and into the stomach.  An ultrasound. This test can help rule out gallbladder disease or pancreatitis as a cause of your symptoms. It uses sound waves to take pictures of the inside of your body.  How is this treated? There is no cure for gastroparesis. This condition may be managed with:  Treatment of the underlying condition causing the gastroparesis.  Lifestyle changes, including exercise and dietary changes. Dietary changes can include: ? Changes in what and when you eat. ? Eating smaller meals more often. ? Eating low-fat foods. ?  Eating low-fiber forms of high-fiber foods, such as cooked vegetables instead of raw vegetables. ? Having liquid foods in place of solid foods. Liquid foods are easier to digest.  Medicines. These may be given to control nausea and vomiting and to stimulate stomach muscles.  Getting food through a feeding tube. This  may be done in severe cases.  A gastric neurostimulator. This is a device that is inserted into the body with surgery. It helps improve stomach emptying and control nausea and vomiting.  Follow these instructions at home:  Follow your health care provider's instructions about exercise and diet.  Take medicines only as directed by your health care provider. Contact a health care provider if:  Your symptoms do not improve with treatment.  You have new symptoms. Get help right away if:  You have severe abdominal pain that does not improve with treatment.  You have nausea that does not go away.  You cannot keep fluids down. This information is not intended to replace advice given to you by your health care provider. Make sure you discuss any questions you have with your health care provider. Document Released: 08/22/2005 Document Revised: 01/28/2016 Document Reviewed: 08/18/2014 Elsevier Interactive Patient Education  [employer].

## 2017-08-09 NOTE — Progress Notes (Signed)
[name] Progress Note  Subjective:  [name] is a 51 y.o. male with history of T2DM and CHF s/p LVAD and ESRD requiring dialysis. He presents to discuss management of his diabetes.   #T2DM: - Checking sugars 2-3 times a day. CBGs generally in "mid 100s", sometimes low 200s. Lowest CBGs noted were 90 and 95; does report a CBG of 48 at dialysis 2 weeks ago and did not have symptoms. - No longer taking jardiance, as does not make urine. - Taking lantus 5 units and novolog 3 units with meals; last regimen had been 21 U lantus and 7 units novolog with largest meal of day but PO intake has decreased. Still struggling with gastroparesis and eating about 3 small meals a day and limiting fluids due to ESRD. Also does not yet have dentures after having his teeth removed prior to LVAD surgery.  - Referral previously placed to [facility] for eye exam - He is interested in a continuous glucose monitor.  ROS: Does not make urine, no falls, no chest pain  Regarding increased weight today, patient's life vest weight 7 lbs and is due for dialysis where about 2-3L will be taken off. He denies SOB.   Allergies  Allergen Reactions  . Metformin And Related Diarrhea    Social History   Tobacco Use  . Smoking status: Never Smoker  . Smokeless tobacco: Never Used  Substance Use Topics  . Alcohol use: No    Objective: Blood pressure 130/90, pulse 82, temperature 97.7 F (36.5 C), temperature source Oral, height 5' 8"  (1.727 m), weight 185 lb 12.8 oz (84.3 kg), SpO2 97 %. Body mass index is 28.25 kg/m. Constitutional: Well appearing male in NAD HENT: Edentulous Cardiovascular: Continuous hum; R AV fistula with bruit Pulmonary/Chest: Effort normal and breath sounds normal.  Skin: Skin is warm and dry. No rash noted.  Psychiatric: Normal mood and affect.  Vitals reviewed  Assessment/Plan: Type 2 diabetes mellitus (HCC) - Blood sugars sound fairly well  controlled given patient's reported CBGs. Counseled patient he could increase lantus by 1 U every 1-2 days if fasting am CBG 150 or greater. Did not recommend increasing past 10 U prior to being seen again since patient is at risk for hypoglycemia with poor appetite and no dentures at present. - Provided handout on gastroparesis diet.  - Due for A1c check next month. - Will ensure Ophthalmology f/u at next visit. - To see Dr. Valentina Lucks for consideration of wand glucometer.   Follow-up next month for A1c check.  HM-- received TDAP vaccine.   [name], MD [place], PGY-3

## 2017-08-11 ENCOUNTER — Other Ambulatory Visit (HOSPITAL_COMMUNITY): Payer: Self-pay | Admitting: *Deleted

## 2017-08-11 ENCOUNTER — Encounter (HOSPITAL_COMMUNITY): Payer: BLUE CROSS/BLUE SHIELD

## 2017-08-11 ENCOUNTER — Encounter: Payer: BLUE CROSS/BLUE SHIELD | Admitting: Vascular Surgery

## 2017-08-11 DIAGNOSIS — Z95811 Presence of heart assist device: Secondary | ICD-10-CM

## 2017-08-13 ENCOUNTER — Encounter: Payer: Self-pay | Admitting: Internal Medicine

## 2017-08-13 NOTE — Assessment & Plan Note (Signed)
-   Blood sugars sound fairly well controlled given patient's reported CBGs. Counseled patient he could increase lantus by 1 U every 1-2 days if fasting am CBG 150 or greater. Did not recommend increasing past 10 U prior to being seen again since patient is at risk for hypoglycemia with poor appetite and no dentures at present. - Provided handout on gastroparesis diet.  - Due for A1c check next month. - Will ensure Ophthalmology f/u at next visit. - To see Dr. Valentina Lucks for consideration of wand glucometer.

## 2017-08-14 ENCOUNTER — Other Ambulatory Visit (HOSPITAL_COMMUNITY): Payer: Self-pay | Admitting: Unknown Physician Specialty

## 2017-08-14 ENCOUNTER — Encounter (HOSPITAL_COMMUNITY): Payer: BLUE CROSS/BLUE SHIELD

## 2017-08-14 DIAGNOSIS — Z7901 Long term (current) use of anticoagulants: Secondary | ICD-10-CM

## 2017-08-14 DIAGNOSIS — Z95811 Presence of heart assist device: Secondary | ICD-10-CM

## 2017-08-14 DIAGNOSIS — N186 End stage renal disease: Secondary | ICD-10-CM

## 2017-08-14 DIAGNOSIS — I5043 Acute on chronic combined systolic (congestive) and diastolic (congestive) heart failure: Secondary | ICD-10-CM

## 2017-08-15 ENCOUNTER — Inpatient Hospital Stay (HOSPITAL_COMMUNITY): Admission: RE | Admit: 2017-08-15 | Payer: Self-pay | Source: Ambulatory Visit

## 2017-08-16 ENCOUNTER — Other Ambulatory Visit (HOSPITAL_COMMUNITY): Payer: Self-pay | Admitting: *Deleted

## 2017-08-16 ENCOUNTER — Encounter (HOSPITAL_COMMUNITY): Payer: BLUE CROSS/BLUE SHIELD

## 2017-08-16 DIAGNOSIS — Z95811 Presence of heart assist device: Secondary | ICD-10-CM

## 2017-08-16 DIAGNOSIS — I252 Old myocardial infarction: Secondary | ICD-10-CM | POA: Insufficient documentation

## 2017-08-16 DIAGNOSIS — I255 Ischemic cardiomyopathy: Secondary | ICD-10-CM | POA: Insufficient documentation

## 2017-08-16 DIAGNOSIS — Z95810 Presence of automatic (implantable) cardiac defibrillator: Secondary | ICD-10-CM | POA: Insufficient documentation

## 2017-08-16 DIAGNOSIS — G47419 Narcolepsy without cataplexy: Secondary | ICD-10-CM | POA: Insufficient documentation

## 2017-08-16 DIAGNOSIS — K5090 Crohn's disease, unspecified, without complications: Secondary | ICD-10-CM | POA: Insufficient documentation

## 2017-08-16 DIAGNOSIS — Z79899 Other long term (current) drug therapy: Secondary | ICD-10-CM | POA: Insufficient documentation

## 2017-08-16 DIAGNOSIS — Z5181 Encounter for therapeutic drug level monitoring: Secondary | ICD-10-CM

## 2017-08-16 DIAGNOSIS — G4710 Hypersomnia, unspecified: Secondary | ICD-10-CM | POA: Insufficient documentation

## 2017-08-16 DIAGNOSIS — K3184 Gastroparesis: Secondary | ICD-10-CM | POA: Insufficient documentation

## 2017-08-16 DIAGNOSIS — I110 Hypertensive heart disease with heart failure: Secondary | ICD-10-CM | POA: Insufficient documentation

## 2017-08-16 DIAGNOSIS — Z955 Presence of coronary angioplasty implant and graft: Secondary | ICD-10-CM | POA: Insufficient documentation

## 2017-08-16 DIAGNOSIS — I2510 Atherosclerotic heart disease of native coronary artery without angina pectoris: Secondary | ICD-10-CM | POA: Insufficient documentation

## 2017-08-16 DIAGNOSIS — E1143 Type 2 diabetes mellitus with diabetic autonomic (poly)neuropathy: Secondary | ICD-10-CM | POA: Insufficient documentation

## 2017-08-16 DIAGNOSIS — Z7902 Long term (current) use of antithrombotics/antiplatelets: Secondary | ICD-10-CM | POA: Insufficient documentation

## 2017-08-16 DIAGNOSIS — Z794 Long term (current) use of insulin: Secondary | ICD-10-CM | POA: Insufficient documentation

## 2017-08-16 DIAGNOSIS — Z7901 Long term (current) use of anticoagulants: Secondary | ICD-10-CM | POA: Insufficient documentation

## 2017-08-16 DIAGNOSIS — I5022 Chronic systolic (congestive) heart failure: Secondary | ICD-10-CM | POA: Insufficient documentation

## 2017-08-16 DIAGNOSIS — K219 Gastro-esophageal reflux disease without esophagitis: Secondary | ICD-10-CM | POA: Insufficient documentation

## 2017-08-16 DIAGNOSIS — Z86718 Personal history of other venous thrombosis and embolism: Secondary | ICD-10-CM | POA: Insufficient documentation

## 2017-08-16 LAB — PROTIME-INR
INR: 1.96
PROTHROMBIN TIME: 22.2 s — AB (ref 11.4–15.2)

## 2017-08-17 ENCOUNTER — Encounter: Payer: Self-pay | Admitting: Gastroenterology

## 2017-08-17 ENCOUNTER — Encounter: Payer: Self-pay | Admitting: Pharmacist

## 2017-08-17 ENCOUNTER — Ambulatory Visit (INDEPENDENT_AMBULATORY_CARE_PROVIDER_SITE_OTHER): Payer: BLUE CROSS/BLUE SHIELD | Admitting: Pharmacist

## 2017-08-17 ENCOUNTER — Ambulatory Visit (INDEPENDENT_AMBULATORY_CARE_PROVIDER_SITE_OTHER): Payer: BLUE CROSS/BLUE SHIELD | Admitting: Gastroenterology

## 2017-08-17 ENCOUNTER — Telehealth: Payer: Self-pay | Admitting: Pharmacist

## 2017-08-17 VITALS — BP 104/80 | HR 89 | Ht 66.0 in | Wt 187.2 lb

## 2017-08-17 DIAGNOSIS — R112 Nausea with vomiting, unspecified: Secondary | ICD-10-CM

## 2017-08-17 DIAGNOSIS — E1143 Type 2 diabetes mellitus with diabetic autonomic (poly)neuropathy: Secondary | ICD-10-CM

## 2017-08-17 DIAGNOSIS — K3184 Gastroparesis: Secondary | ICD-10-CM

## 2017-08-17 DIAGNOSIS — Z794 Long term (current) use of insulin: Secondary | ICD-10-CM | POA: Diagnosis not present

## 2017-08-17 DIAGNOSIS — E118 Type 2 diabetes mellitus with unspecified complications: Secondary | ICD-10-CM

## 2017-08-17 MED ORDER — GLUCOSE BLOOD VI STRP: ORAL_STRIP | 12 refills | Status: DC

## 2017-08-17 MED ORDER — AMBULATORY NON FORMULARY MEDICATION: 10.0000 mg | Freq: Three times a day (TID) | 0 refills | Status: DC

## 2017-08-17 MED ORDER — FREESTYLE LIBRE SENSOR SYSTEM MISC: 11 refills | Status: DC

## 2017-08-17 MED ORDER — FREESTYLE LIBRE READER DEVI: 1.0000 | Freq: Once | 0 refills | Status: AC

## 2017-08-17 NOTE — Progress Notes (Signed)
    S:     Chief Complaint  Patient presents with  . Medication Management    diabetes    Patient arrives in fair spirits, ambulating slowly without assistance.  He is accompanied by his assistant, "Gerald Stabs".  He states his son manages his medications.  He believed all of his medications were accurate in his medication list as he has recently had multiple visits.  He presents for diabetes evaluation, education, and management at the request of Dr. Ola Spurr. Patient was referred on 08/09/2017.  Patient was last seen by Primary Care Provider on 08/09/2017.   Patient presents today specifically to discuss CGM monitor. Unfortunately, BCBS commercial plan does NOT cover freestyle libre or dexcom CGM systems. Despite this, patient remained interested in obtaining a CGM monitor from the pharmacy at his out of pocket cost.  He appeared to understand the details on the device and asked several questions about use of the device.   Patient reports adherence with medications.  Current diabetes medications include:  Lantus 5 units daily plus novolog 3-4 units prior to meals.   Patient denies any hypoglycemic events with recent insulin regimen, had two previously to 48 and 74 (asymptomatic).    Patient reported dietary habits: Eats 3 meals/day, denies issues with n/v. Not needing to take zofran. Reports that reglan "helped a lot"  Patient was recently discharged from hospital and all medications have been reviewed.  O:  Physical Exam  Constitutional: He appears well-developed and well-nourished.     Review of Systems  All other systems reviewed and are negative.    Lab Results  Component Value Date   HGBA1C 10.1 (H) 06/07/2017   Home fasting CBGs and pre-prandial CBGs: frequently range from 150s-170s, excursions to low 200s per patient memory  A/P: Diabetes longstanding currently with improved control per patient reported CBGs. Patient denies hypoglycemic events and is able to verbalize  appropriate hypoglycemia management plan. Patient reports adherence with medication. Control is suboptimal due to complicated medical history including gastroparesis and insulin resistance. Patient interested in CGM and verbalized willingness to pay out of pocket. Freestyle Libre vs Dexcom products discussed.  -Sent prescription for [employer] 10-day sensors and reader -Discussed drug-device and device-procedure interactions. Patient aware to avoid high dose salicylic acid (aspirin) and vitamin C products while wearing sensor. Patient aware he cannot have imagining (MRI, CT, XRay) done while wearing the device. -Continue checking blood glucose and taking medications as normal.  -Scheduled patient to return to clinic next week to for CGM teaching.  Written patient instructions provided.  Total time in face to face counseling 20 minutes.   Follow up in Pharmacist Clinic Visit 1 week.   Patient seen with Deirdre Pippins, PGY2 Pharmacy Resident, PharmD, BCPS

## 2017-08-17 NOTE — Telephone Encounter (Signed)
Called [facility] and confirmed below pricing:  Reader - $64.99 (one time cost)  Sensors (3) - $74.99 (monthly)   Called patient to rely the above findings. Patient verbalized understanding.   Carlean Jews, Pharm.D., BCPS PGY2 Ambulatory Care Pharmacy Resident Phone: [phone]

## 2017-08-17 NOTE — Progress Notes (Signed)
Patient ID: [name], male   DOB: 08-09-1966, 51 y.o.   MRN: [id number] Reviewed: Agree with the documentation and management of Dr. Valentina Lucks.

## 2017-08-17 NOTE — Patient Instructions (Signed)
If you are age 52 or older, your body mass index should be between 23-30. Your Body mass index is 30.22 kg/m. If this is out of the aforementioned range listed, please consider follow up with your Primary Care Provider.  If you are age 104 or younger, your body mass index should be between 19-25. Your Body mass index is 30.22 kg/m. If this is out of the aformentioned range listed, please consider follow up with your Primary Care Provider.   We have sent the following medications to your pharmacy for you to pick up at your convenience:  Domperidone

## 2017-08-17 NOTE — Assessment & Plan Note (Signed)
Diabetes longstanding currently with improved control per patient reported CBGs. Patient denies hypoglycemic events and is able to verbalize appropriate hypoglycemia management plan. Patient reports adherence with medication. Control is suboptimal due to complicated medical history including gastroparesis and insulin resistance. Patient interested in CGM and verbalized willingness to pay out of pocket. Freestyle Libre vs Dexcom products discussed.  -Sent prescription for [employer] 10-day sensors and reader -Discussed drug-device and device-procedure interactions. Patient aware to avoid high dose salicylic acid (aspirin) and vitamin C products while wearing sensor. Patient aware he cannot have imagining (MRI, CT, XRay) done while wearing the device. -Continue checking blood glucose and taking medications as normal.  -Scheduled patient to return to clinic next week to for CGM teaching.

## 2017-08-17 NOTE — Progress Notes (Signed)
08/17/2017 [name] [id number] 13-Dec-1965   HISTORY OF PRESENT ILLNESS:  This a 51 year old African-American male, patient of Dr. Woodward Ku, who is insulin dependent diabetic for 30 years with previously documented diagnosis of gastroparesis with two recent hospitalizations for nausea and vomiting.  Currently on reglan 5 mg ACHS and pantoprazole 40 mg daily and doing well.  Rarely needs Zofran and Phenergan.  Had been given some erythromycin as an inpatient as well.  He has had some more complicated medical problems since he was seen here in the office last.  He is now on dialysis Monday/Wednesdays/Fridays.  Also had an LVAD placed in October and is on coumadin.  He had an EGD performed on 07/12/2017 by Dr. Hilarie Fredrickson that showed a 2 cm hiatal hernia and retained gastric fluid consistent with gastroparesis.    Past Medical History:  Diagnosis Date  . Angina   . ASCVD (arteriosclerotic cardiovascular disease)    , Anterior infarction 2005, LAD diagonal bifurcation intervention 03/2004  . Automatic implantable cardiac defibrillator -St. Jude's       . Benign neoplasm of colon   . CHF (congestive heart failure) ([place])   . Chronic systolic heart failure ([place])   . Coronary artery disease     Widely patent previously placed stents in the left anterior   . Crohn's disease ([place])   . 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 Dialysis patient ([place])   . Dyspnea   . Gastroparesis   . GERD (gastroesophageal reflux disease)   . High cholesterol   . Hyperlipidemia   . Hypersomnolent    Previous diagnosis of narcolepsy  . Hypertension, essential   . Ischemic cardiomyopathy    Ejection fraction 15-20% catheterization 2010  . Type II or unspecified type diabetes mellitus without mention of complication, not stated as uncontrolled   . Unspecified gastritis and gastroduodenitis without mention of hemorrhage    Past Surgical History:  Procedure Laterality Date  .  APPENDECTOMY    . AV FISTULA PLACEMENT Right 06/26/2017   Procedure: ARTERIOVENOUS (AV) FISTULA CREATION VERSUS GRAFT RIGHT  ARM;  Surgeon: Conrad Mendon, MD;  Location: [facility];  Service: Vascular;  Laterality: Right;  . CARDIAC DEFIBRILLATOR PLACEMENT  2010   St. Jude ICD  . COLECTOMY  ~ 2003   "for Crohn's" ascending colon.    . CORONARY STENT INTERVENTION N/A 11/25/2016   Procedure: Coronary Stent Intervention;  Surgeon: Leonie Man, MD;  Location: [facility];  Service: Cardiovascular;  Laterality: N/A;  . EP IMPLANTABLE DEVICE N/A 09/14/2016   Procedure: ICD Generator Changeout;  Surgeon: Deboraha Sprang, MD;  Location: [facility];  Service: Cardiovascular;  Laterality: N/A;  . ESOPHAGOGASTRODUODENOSCOPY N/A 07/12/2017   Procedure: ESOPHAGOGASTRODUODENOSCOPY (EGD);  Surgeon: Jerene Bears, MD;  Location: [facility] ENDOSCOPY;  Service: Gastroenterology;  Laterality: N/A;  VAD pt so VAD team will need to accompany pt.   [name] FETAL SURGERY FOR CONGENITAL HERNIA     ????? pt has no knowledge of this..    . INGUINAL HERNIA REPAIR    . INSERTION OF DIALYSIS CATHETER Right 06/26/2017   Procedure: INSERTION OF TUNNELED  DIALYSIS CATHETER RIGHT INTERNAL JUGULAR;  Surgeon: Conrad Rockledge, MD;  Location: [place];  Service: Vascular;  Laterality: Right;  . INSERTION OF IMPLANTABLE LEFT VENTRICULAR ASSIST DEVICE N/A 06/12/2017   Procedure: INSERTION OF IMPLANTABLE LEFT VENTRICULAR ASSIST [place] 3;  Surgeon: Ivin Poot, MD;  Location: [facility]  OR;  Service: Open Heart Surgery;  Laterality: N/A;  HM3 LVAD  CIRC ARREST  NITRIC OXIDE  . IR THORACENTESIS ASP PLEURAL SPACE W/IMG GUIDE  06/28/2017  . MULTIPLE EXTRACTIONS WITH ALVEOLOPLASTY N/A 06/08/2017   Procedure: MULTIPLE EXTRACTION WITH ALVEOLOPLASTY AND PRE PROSTHETIC SURGERY AS NEEDED;  Surgeon: Lenn Cal, DDS;  Location: [place];  Service: Oral Surgery;  Laterality: N/A;  . RIGHT HEART CATH N/A 06/07/2017   Procedure: RIGHT HEART  CATH;  Surgeon: Larey Dresser, MD;  Location: [place] [facility];  Service: Cardiovascular;  Laterality: N/A;  . RIGHT/LEFT HEART CATH AND CORONARY ANGIOGRAPHY N/A 11/23/2016   Procedure: Right/Left Heart Cath and Coronary Angiography;  Surgeon: Larey Dresser, MD;  Location: [place] [facility];  Service: Cardiovascular;  Laterality: N/A;  . TEE WITHOUT CARDIOVERSION N/A 06/12/2017   Procedure: TRANSESOPHAGEAL ECHOCARDIOGRAM (TEE);  Surgeon: Prescott Gum, Collier Salina, MD;  Location: [facility];  Service: Open Heart Surgery;  Laterality: N/A;    reports that  has never smoked. he has never used smokeless tobacco. He reports that he does not drink alcohol or use drugs. family history includes Colitis in his sister and sister; Colon polyps in his mother; Diabetes in his father and mother; Heart attack in his father; Heart disease in his brother; Stroke in his paternal grandfather. Allergies  Allergen Reactions  . Metformin And Related Diarrhea      Outpatient Encounter Medications as of 08/17/2017  Medication Sig  . amiodarone (PACERONE) 200 MG tablet Take 1 tablet (200 mg total) by mouth daily.  [name] amLODipine (NORVASC) 5 MG tablet Take 1 tablet (5 mg total) by mouth daily. Take 1 tablet (46m) on Tuesday, Thursday, Saturday and Sunday (non HD days) by mouth  . aspirin EC 81 MG tablet Take 1 tablet (81 mg total) by mouth daily.  .Marland Kitchenatorvastatin (LIPITOR) 40 MG tablet Take 1 tablet (40 mg total) daily at 6 PM by mouth.  . docusate sodium (COLACE) 100 MG capsule Take 2 capsules (200 mg total) by mouth daily.  . hydrALAZINE (APRESOLINE) 50 MG tablet Take 1 tablet (50 mg total) by mouth 3 (three) times daily.  . insulin aspart (NOVOLOG) 100 UNIT/ML injection Inject 3 Units into the skin 3 (three) times daily with meals.  . insulin glargine (LANTUS) 100 UNIT/ML injection Inject 0.05 mLs (5 Units total) into the skin daily.  .Marland KitchenLORazepam (ATIVAN) 0.5 MG tablet Take 1 tablet (0.5 mg total) by mouth every 12 (twelve)  hours as needed for anxiety or sleep.  . metoCLOPramide (REGLAN) 5 MG tablet Take 1 tablet (5 mg total) by mouth 4 (four) times daily -  before meals and at bedtime.  . ondansetron (ZOFRAN ODT) 4 MG disintegrating tablet Take 1 tablet (4 mg total) by mouth every 6 (six) hours as needed for nausea or vomiting.  . pantoprazole (PROTONIX) 40 MG tablet Take 40 mg by mouth daily.  . promethazine (PHENERGAN) 25 MG tablet Take 1 tablet (25 mg total) by mouth every 6 (six) hours as needed for nausea or vomiting.  . sildenafil (REVATIO) 20 MG tablet Take 1 tablet (20 mg total) by mouth 3 (three) times daily.  .Marland Kitchenwarfarin (COUMADIN) 5 MG tablet Take 1 tablet (5 mg total) by mouth at bedtime. Take 5 mg daily except 2.5 mg on Monday   No facility-administered encounter medications on file as of 08/17/2017.      REVIEW OF SYSTEMS  : All other systems reviewed and negative except where noted  in the History of Present Illness.   PHYSICAL EXAM: BP 104/80 (BP Location: Left Arm, Patient Position: Sitting, Cuff Size: Normal)   Pulse 89   Ht 5' 6"  (1.676 m)   Wt 187 lb 4 oz (84.9 kg)   BMI 30.22 kg/m  General: Well developed black male in no acute distress Head: Normocephalic and atraumatic Eyes:  Sclerae anicteric, conjunctiva pink. Ears: Normal auditory acuity Lungs: Clear throughout to auscultation; no increased WOB. Heart: LVAD hum noted. Abdomen: Soft, non-distended.  BS present.  Non-tender. Musculoskeletal: Symmetrical with no gross deformities  Skin: No lesions on visible extremities Extremities: No edema  Neurological: Alert oriented x 4, grossly non-focal Psychological:  Alert and cooperative. Normal mood and affect  ASSESSMENT AND PLAN: #68  51 year old African-American male, diabetic with previously documented diagnosis of gastroparesis with two recent hospitalizations for nausea and vomiting.  Currently on reglan 5 mg ACHS and pantoprazole 40 mg daily and doing well.  Rarely needs Zofran  and Phenergan.  We will try to get him switched over to domperidone. #2 status post remote right hemicolectomy-remote diagnosis of Crohn's-last colonoscopy 2009 negative #3 ischemic cardiomyopathy with EF 25% and now with LVAD, also status post ICD, on coumadin #4 longstanding insulin-dependent diabetes mellitus #5 ESRD, now on HD MWF  **He will continue his Reglan until we are able to obtain his domperidone.  Then he was switched over to the domperidone and will follow-up here in about 6 weeks.  Continue Zofran and Phenergan as needed.  Discussed eating small meals and avoiding eating late at night.  CC:  Larey Seat*

## 2017-08-17 NOTE — Patient Instructions (Addendum)
Thanks for coming to see [name]! [facility] [address] all [facility] they give you next Thursday at [facility].

## 2017-08-18 ENCOUNTER — Encounter (HOSPITAL_COMMUNITY): Payer: BLUE CROSS/BLUE SHIELD

## 2017-08-18 NOTE — Progress Notes (Signed)
Patient has prolonged QTc ~590 on most recent ECG. Given his baseline prolonged QTc, Domperidone is contraindicated for him.  Continue to follow Gastroparesis diet and low dose Reglan as tolerated

## 2017-08-21 ENCOUNTER — Encounter (HOSPITAL_COMMUNITY): Payer: BLUE CROSS/BLUE SHIELD

## 2017-08-21 ENCOUNTER — Telehealth: Payer: Self-pay

## 2017-08-21 ENCOUNTER — Telehealth (HOSPITAL_COMMUNITY): Payer: Self-pay | Admitting: *Deleted

## 2017-08-21 NOTE — Telephone Encounter (Signed)
Late entry- Received page 12/16 around 1100 from patients daughter stating patient is unable to keep anything down. He has been vomiting since the prior evening. Dr Aundra Dubin notified. Prescription for phenergan suppositories sent to his pharmacy per verbal order.  Balinda Quails RN, VAD Coordinator 24/7 pager [phone]

## 2017-08-21 NOTE — Telephone Encounter (Signed)
-----   Message from Oliva Bustard sent at 08/09/2017 12:05 PM EST ----- Regarding: RE: arrange follow up of this pt FYI: I've called him twice & mailed him a letter.   ----- Message ----- From: Greggory Keen, LPN Sent: [id number]   3:37 PM To: Oliva Bustard Subject: FW: arrange follow up of this pt               Would you help me with this? Please? I will give you anything your heart desires? My first born? ----- Message ----- From: Clearence Cheek Sent: 08/04/2017   3:29 PM To: Ladene Artist, MD, Greggory Keen, LPN, # Subject: arrange follow up of this pt                   Hi.  This pt has been seen in hospital lately for diabetic gastroparesis. Dr Delane Ginger, you are his designated GI. He needs GI follow up in 2 to 4 weeks, so probably an APP appt unless Dr Delane Ginger has time.  Can you have you arrange this and let Mr Fraley know date?  He has dialysis on MWF so TTh are best days for him.  Thanks and have a nice weekend.   Judson Roch

## 2017-08-22 ENCOUNTER — Ambulatory Visit (HOSPITAL_COMMUNITY): Payer: Self-pay | Admitting: Pharmacist

## 2017-08-22 ENCOUNTER — Ambulatory Visit (HOSPITAL_COMMUNITY)
Admission: RE | Admit: 2017-08-22 | Discharge: 2017-08-22 | Disposition: A | Discharge status: Home / Self Care | Payer: BLUE CROSS/BLUE SHIELD | Source: Ambulatory Visit | Attending: Cardiology | Admitting: Cardiology

## 2017-08-22 ENCOUNTER — Encounter (HOSPITAL_COMMUNITY): Payer: Self-pay

## 2017-08-22 VITALS — BP 131/80 | HR 83 | Resp 16 | Ht 68.0 in | Wt 183.8 lb

## 2017-08-22 DIAGNOSIS — Z86718 Personal history of other venous thrombosis and embolism: Secondary | ICD-10-CM | POA: Diagnosis not present

## 2017-08-22 DIAGNOSIS — I4892 Unspecified atrial flutter: Secondary | ICD-10-CM | POA: Diagnosis not present

## 2017-08-22 DIAGNOSIS — I252 Old myocardial infarction: Secondary | ICD-10-CM | POA: Diagnosis not present

## 2017-08-22 DIAGNOSIS — I472 Ventricular tachycardia: Secondary | ICD-10-CM | POA: Insufficient documentation

## 2017-08-22 DIAGNOSIS — Z7901 Long term (current) use of anticoagulants: Secondary | ICD-10-CM

## 2017-08-22 DIAGNOSIS — K5090 Crohn's disease, unspecified, without complications: Secondary | ICD-10-CM | POA: Diagnosis not present

## 2017-08-22 DIAGNOSIS — I739 Peripheral vascular disease, unspecified: Secondary | ICD-10-CM | POA: Insufficient documentation

## 2017-08-22 DIAGNOSIS — I2510 Atherosclerotic heart disease of native coronary artery without angina pectoris: Secondary | ICD-10-CM | POA: Diagnosis not present

## 2017-08-22 DIAGNOSIS — E1122 Type 2 diabetes mellitus with diabetic chronic kidney disease: Secondary | ICD-10-CM | POA: Diagnosis not present

## 2017-08-22 DIAGNOSIS — I10 Essential (primary) hypertension: Secondary | ICD-10-CM

## 2017-08-22 DIAGNOSIS — K3184 Gastroparesis: Secondary | ICD-10-CM | POA: Insufficient documentation

## 2017-08-22 DIAGNOSIS — Z95811 Presence of heart assist device: Secondary | ICD-10-CM

## 2017-08-22 DIAGNOSIS — E785 Hyperlipidemia, unspecified: Secondary | ICD-10-CM | POA: Diagnosis not present

## 2017-08-22 DIAGNOSIS — Z7982 Long term (current) use of aspirin: Secondary | ICD-10-CM | POA: Insufficient documentation

## 2017-08-22 DIAGNOSIS — Z794 Long term (current) use of insulin: Secondary | ICD-10-CM | POA: Insufficient documentation

## 2017-08-22 DIAGNOSIS — I4581 Long QT syndrome: Secondary | ICD-10-CM | POA: Insufficient documentation

## 2017-08-22 DIAGNOSIS — I5022 Chronic systolic (congestive) heart failure: Secondary | ICD-10-CM | POA: Insufficient documentation

## 2017-08-22 DIAGNOSIS — I255 Ischemic cardiomyopathy: Secondary | ICD-10-CM

## 2017-08-22 DIAGNOSIS — K219 Gastro-esophageal reflux disease without esophagitis: Secondary | ICD-10-CM | POA: Diagnosis not present

## 2017-08-22 DIAGNOSIS — Z79899 Other long term (current) drug therapy: Secondary | ICD-10-CM | POA: Insufficient documentation

## 2017-08-22 DIAGNOSIS — Z8546 Personal history of malignant neoplasm of prostate: Secondary | ICD-10-CM | POA: Insufficient documentation

## 2017-08-22 DIAGNOSIS — N186 End stage renal disease: Secondary | ICD-10-CM | POA: Diagnosis not present

## 2017-08-22 DIAGNOSIS — Z5181 Encounter for therapeutic drug level monitoring: Secondary | ICD-10-CM

## 2017-08-22 DIAGNOSIS — I132 Hypertensive heart and chronic kidney disease with heart failure and with stage 5 chronic kidney disease, or end stage renal disease: Secondary | ICD-10-CM | POA: Insufficient documentation

## 2017-08-22 DIAGNOSIS — I82499 Acute embolism and thrombosis of other specified deep vein of unspecified lower extremity: Secondary | ICD-10-CM

## 2017-08-22 MED ORDER — AMLODIPINE BESYLATE 10 MG PO TABS: 10.0000 mg | ORAL_TABLET | Freq: Every day | ORAL | 6 refills | Status: DC

## 2017-08-22 NOTE — Progress Notes (Addendum)
Patient presents for 2 week follow up in [facility] today. Reports no problems with VAD equipment or concerns with drive line.  Vital Signs:  Doppler Pressure 120   Automatc BP: 131/80 (102) HR:83   SPO2:99  %  Weight: 183.8 lb w/o eqt Last weight: 179.8 lb Hospital d/c weight: 176 lbs  VAD Indication: Destination therapy    VAD interrogation & Equipment Management: Speed:5600 Flow: 3.8 Power:4.1 w    PI:4.1  Alarms: no clinical alarms Events: none  Fixed speed 5600 Low speed limit: 5300  Primary Controller:  Replace back up battery in 65month. Back up controller:   Replace back up battery in 52  months.  Annual Equipment Maintenance on UBC/PM was performed on 06/2017.   I reviewed the LVAD parameters from today and compared the results to the patient's prior recorded data. LVAD interrogation was NEGATIVE for significant power changes, NEGATIVE for clinical alarms and STABLE for PI events/speed drops. No programming changes were made and pump is functioning within specified parameters. Pt is performing daily controller and system monitor self tests along with completing weekly and monthly maintenance for LVAD equipment.  LVAD equipment check completed and is in good working order. Back-up equipment present. Charged back up battery and performed self-test on equipment.   Exit Site Care: Drive line is being maintained weekly  by his daughter, KCarmell Austria Drive line exit site well healed and incorporated. The velour is fully implanted at exit site. Dressing dry and intact. No erythema or drainage. Stabilization device present and accurately applied. Pt denies fever or chills. Pt states they have adequate dressing supplies at home.   Significant Events on VAD Support:  07/10/2017> gastroparesis diagnosis after EGD 07/31/2017> gastroparesis   Device:St Jude dual Therapies: on at 200 BPM Monitor: on slow VT 150 BPM  BP & Labs:  MAP120 - Doppler is reflecting  modified systolic  Unable to draw labs today in clinic. WIll arrange to have labs drawn at HD center tomorrow during treatment.  Plan: 1. Cardiac referral sent 2. Stop amiodarone 3. Continue phenergan suppositories as needed.  4. Increase norvasc to 10 mg daily on non-HD days  LBalinda QuailsRN VAD Coordinator   Office: [phone] Emergency VAD Pager: [phone] Cardiology: Dr. MAundra Dubin HPI: 51yo with history of CAD s/p anterior MI in 2010 and NSTEMI in 3/18 with DES to pRCA and mLAD and ischemic cardiomyopathy now s/p Heartmate 3 LVAD and ESRD presents for followup of CHF/LVAD.  Patient developed low output heart failure.  He was not deemed to be a good transplant candidate.  He was admitted in 10/18 and Heartmate 3 LVAD was placed.  He had baseline CKD stage 3 likely from diabetic nephropathy and CHF.  He developed peri-op AKI and ended up on dialysis.  No renal recovery to date.  St Jude ICD has been turned back on and ramp echo was done prior to discharge in 10/18, speed increased to 5600 rpm.    He was admitted in 11/18 with nausea/vomiting and abdominal pain.  This appears to have been due to diabetic gastroparesis (extensive GI evaluation). He had atrial flutter this admission that was converted to NSR with amiodarone.  He improved considerably with scheduled Reglan and erythromycin. He was re-admitted later in 11/18 with the same symptoms, suspect diabetic gastroparesis and was again treated with erythromycin.  He was not able to start domperidone due to prolonged QT interval. He had nausea again about a week ago but was able to  manage at home with Phenergan suppositories.   He returns for followup of CHF/LVAD.  He is not nauseated, no recent vomiting. MAP in 90s.  He is taking BP-active meds on non-HD days.  No lightheadedness.  He is walking around his house and in stores, etc without dyspnea.    Denies LVAD alarms.  Denies driveline trauma, erythema or drainage.  Denies ICD  shocks.    Reports taking Coumadin as prescribed and adherence to anticoagulation based dietary restrictions.  Denies bright red blood per rectum or melena, no dark urine or hematuria.    Labs (11/18): hgb 10.4 => 8.7, LDH 213 Labs (08/04/17): hgb 9  PMH: 1. Chronic systolic CHF: Ischemic cardiomyopathy. St Jude ICD.  - Echo (11/17) with EF 25-30%, moderate diastolic dysfunction, mild MR, mildly dilated RV with moderately decreased systolic function, peak RV-RA gradient 48 mmHg.  - Echo (3/18) with EF 25-30%, wall motion abnormalities, normal RV size and systolic function.  - Admission 3/18 with cardiogenic shock and AKI, required milrinone gtt.  - Echo (8/18) with EF 20-25%, mild LVH, moderate LV dilation, mild MR.  - CPX (8/18) with peak VO2 12.4, VE/VCO2 slope 35, RER 1.05 => severe HR limitation.  - Heartmate 3 LVAD 10/18.  2. CAD: Anterior MI 2005, had bifurcation stenting LAD/diagonal. Repeat cath 2010 with patent stents.  - NSTEMI 3/18: LHC with 99% ulcerated lesion proximal RCA, 95% mid LAD => PCI with DES to proximal RCA and mid LAD.  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  - Lower extremity venous dopplers (8/18): chronic DVT - V/Q scan (8/18): Normal, no PE.  4. Crohns disease: History of colectomy.  5. GERD 6. Diabetic gastroparesis: Admission 11/18 with abdominal pain, nausea/vomiting.  7. HTN 8. Hyperlipidemia 9. Type II diabetes  10. ESRD: Post-op LVAD surgery.  11. Carotid dopplers (8/18): mild bilateral stenosis.  12. PFTs (8/18): Mild restriction (no IDL on CT chest).  13. Lung nodules: CT chest 8/18 with small bilateral nodules.  14. PAD: ABIs with moderate bilateral disease in 8/18.  15. Atrial flutter: In 11/18 associated with nausea/vomiting from gastroparesis.   Current Outpatient Medications  Medication Sig Dispense Refill  . AMBULATORY NON FORMULARY MEDICATION Take 10 mg by mouth 4 (four) times daily -  before meals and at bedtime. Medication Name: Domperidone  360 tablet 0  . amLODipine (NORVASC) 10 MG tablet Take 1 tablet (10 mg total) by mouth daily. Take 1 tablet (66m) on Tuesday, Thursday, Saturday and Sunday (non HD days) by mouth 90 tablet 6  . aspirin EC 81 MG tablet Take 1 tablet (81 mg total) by mouth daily. 90 tablet 3  . atorvastatin (LIPITOR) 40 MG tablet Take 1 tablet (40 mg total) daily at 6 PM by mouth. 30 tablet 6  . Continuous Blood Gluc Sensor (FREESTYLE LIBRE SENSOR SYSTEM) MISC Apply 1 reader to the skin every 10 days. 3 each 11  . docusate sodium (COLACE) 100 MG capsule Take 2 capsules (200 mg total) by mouth daily. 10 capsule 0  . glucose blood (FREESTYLE PRECISION NEO TEST) test strip Use as instructed 100 each 12  . hydrALAZINE (APRESOLINE) 50 MG tablet Take 1 tablet (50 mg total) by mouth 3 (three) times daily. 90 tablet 6  . insulin aspart (NOVOLOG) 100 UNIT/ML injection Inject 3 Units into the skin 3 (three) times daily with meals. (Patient taking differently: Inject 3-5 Units into the skin 3 (three) times daily with meals. ) 1 vial 0  . insulin glargine (LANTUS) 100  UNIT/ML injection Inject 0.05 mLs (5 Units total) into the skin daily. 10 mL 11  . LORazepam (ATIVAN) 0.5 MG tablet Take 1 tablet (0.5 mg total) by mouth every 12 (twelve) hours as needed for anxiety or sleep. 30 tablet 0  . metoCLOPramide (REGLAN) 5 MG tablet Take 1 tablet (5 mg total) by mouth 4 (four) times daily -  before meals and at bedtime. 130 tablet 6  . ondansetron (ZOFRAN ODT) 4 MG disintegrating tablet Take 1 tablet (4 mg total) by mouth every 6 (six) hours as needed for nausea or vomiting. 40 tablet 1  . pantoprazole (PROTONIX) 40 MG tablet Take 40 mg by mouth daily.    . promethazine (PHENERGAN) 25 MG suppository Place 25 mg rectally every 6 (six) hours as needed for nausea or vomiting.    . promethazine (PHENERGAN) 25 MG tablet Take 1 tablet (25 mg total) by mouth every 6 (six) hours as needed for nausea or vomiting. 30 tablet 0  . sildenafil (REVATIO)  20 MG tablet Take 1 tablet (20 mg total) by mouth 3 (three) times daily. 90 tablet 6  . warfarin (COUMADIN) 5 MG tablet Take 5 mg by mouth daily.     No current facility-administered medications for this encounter.     Metformin and related  REVIEW OF SYSTEMS: All systems negative except as listed in HPI, PMH and Problem list.   LVAD INTERROGATION: See LVAD nurse's note above.   I reviewed the LVAD parameters from today, and compared the results to the patient's prior recorded data.  No programming changes were made.  The LVAD is functioning within specified parameters.  The patient performs LVAD self-test daily.  LVAD interrogation was negative for any significant power changes, alarms or PI events/speed drops.  LVAD equipment check completed and is in good working order.  Back-up equipment present.   LVAD education done on emergency procedures and precautions and reviewed exit site care.    Vitals:   08/22/17 1009  BP: 131/80  Pulse: 83  Resp: 16  SpO2: 99%  Weight: 183 lb 12.8 oz (83.4 kg)  Height: 5' 8"  (1.727 m)    Physical Exam: GENERAL: Well appearing this am. NAD.  HEENT: Normal. NECK: Supple, JVP 7-8 cm. Carotids OK.  CARDIAC:  Mechanical heart sounds with LVAD hum present.  LUNGS:  CTAB, normal effort.  ABDOMEN:  NT, ND, no HSM. No bruits or masses. +BS  LVAD exit site: Well-healed and incorporated. Dressing dry and intact. No erythema or drainage. Stabilization device present and accurately applied. Driveline dressing changed daily per sterile technique. EXTREMITIES:  Warm and dry. No cyanosis, clubbing, rash, or edema.  NEUROLOGIC:  Alert & oriented x 3. Cranial nerves grossly intact. Moves all 4 extremities w/o difficulty. Affect pleasant    ASSESSMENT AND PLAN: 1. Chronic systolic CHF: Ischemic cardiomyopathy.  St Jude ICD.  NSTEMI in March 2018 with DES to LAD and RCA, complicated by cardiogenic shock, low output requiring milrinone.  Echo 8/18 with EF 20-25%,  moderate MR.  PX (8/18) with severe functional impairment due to HF.  He is now s/p HM3 LVAD placement. LVAD parameters reviewed today and stable.  No complaints. MAP remains in 90s.  Hemoglobin stable.  NYHA class II symptoms, he is not volume overloaded.  - Continue ASA 81.  Continue warfarin INR 2-3 for now with no overt GI bleeding.  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  - I will refer for cardiac rehab.  2. ESRD: Developed peri-operatively after HM3 placement.  He is tolerating HD without problems currently.  3. CAD: NSTEMI in 3/18. LHC with 99% ulcerated lesion proximal RCA with left to right collaterals, 95% mid LAD stenosis after mid LAD stent. s/p PCI to RCA and LAD on 11/25/16.  No exertional chest pain.  - Continue statin and ASA. 4. h/o DVTs: Factor V Leiden heterozygote.  On warfarin.  5. Diabetic gastroparesis: Currently controlled. Cannot take domperidone due to prolonged QT interval, may revisit this with GI as he is somewhat protected by having LVAD.  Will recheck QT interval after stopping amiodarone.  He has Phenergan suppositories if nausea/vomiting returns.  6. DM: Per internal medicine.  7. HTN: MAP mildly elevated.  Continue hydralazine 50 mg tid and will increase amlodipine to 10 mg daily.  8. PAD: Moderate bilateral disease on ABIs in 8/18.  Denies claudication currently, no pedal ulcers.  9. Atrial flutter: In setting of severe nausea/vomiting.  No recurrence, I will stop amiodarone.   Arlander Gillen 08/22/2017

## 2017-08-22 NOTE — Patient Instructions (Signed)
Stop taking Amiodarone  Increase your Norvasc (amlodipine) to 10 mg daily on non-dialysis days  Referral sent to cardiac rehab  Return to clinic in 1 month  Happy holidays! You look great!

## 2017-08-22 NOTE — Addendum Note (Signed)
Addended by: Adora Fridge on: 08/22/2017 12:40 PM   Modules accepted: Orders

## 2017-08-23 ENCOUNTER — Other Ambulatory Visit (HOSPITAL_COMMUNITY): Payer: Self-pay | Admitting: *Deleted

## 2017-08-23 ENCOUNTER — Encounter (HOSPITAL_COMMUNITY): Payer: BLUE CROSS/BLUE SHIELD

## 2017-08-23 DIAGNOSIS — Z95811 Presence of heart assist device: Secondary | ICD-10-CM

## 2017-08-24 ENCOUNTER — Encounter: Payer: Self-pay | Admitting: Pharmacist

## 2017-08-24 ENCOUNTER — Other Ambulatory Visit (HOSPITAL_COMMUNITY): Payer: Self-pay | Admitting: *Deleted

## 2017-08-24 ENCOUNTER — Ambulatory Visit (INDEPENDENT_AMBULATORY_CARE_PROVIDER_SITE_OTHER): Payer: BLUE CROSS/BLUE SHIELD | Admitting: Pharmacist

## 2017-08-24 DIAGNOSIS — Z794 Long term (current) use of insulin: Secondary | ICD-10-CM | POA: Diagnosis not present

## 2017-08-24 DIAGNOSIS — Z95811 Presence of heart assist device: Secondary | ICD-10-CM

## 2017-08-24 DIAGNOSIS — E118 Type 2 diabetes mellitus with unspecified complications: Secondary | ICD-10-CM | POA: Diagnosis not present

## 2017-08-24 LAB — BASIC METABOLIC PANEL
ANION GAP: 10 (ref 5–15)
BUN: 39 mg/dL — ABNORMAL HIGH (ref 6–20)
CALCIUM: 8.8 mg/dL — AB (ref 8.9–10.3)
CO2: 25 mmol/L (ref 22–32)
CREATININE: 6.67 mg/dL — AB (ref 0.61–1.24)
Chloride: 102 mmol/L (ref 101–111)
GFR calc Af Amer: 10 mL/min — ABNORMAL LOW (ref >60)
GFR calc non Af Amer: 9 mL/min — ABNORMAL LOW (ref >60)
GLUCOSE: 39 mg/dL — AB (ref 65–99)
Potassium: 4.6 mmol/L (ref 3.5–5.1)
Sodium: 137 mmol/L (ref 135–145)

## 2017-08-24 LAB — CBC
HEMATOCRIT: 29.7 % — AB (ref 39.0–52.0)
Hemoglobin: 9.2 g/dL — ABNORMAL LOW (ref 13.0–17.0)
MCH: 25.3 pg — ABNORMAL LOW (ref 26.0–34.0)
MCHC: 31 g/dL (ref 30.0–36.0)
MCV: 81.8 fL (ref 78.0–100.0)
Platelets: 219 10*3/uL (ref 150–400)
RBC: 3.63 MIL/uL — ABNORMAL LOW (ref 4.22–5.81)
RDW: 20.3 % — AB (ref 11.5–15.5)
WBC: 10.3 10*3/uL (ref 4.0–10.5)

## 2017-08-24 NOTE — Assessment & Plan Note (Signed)
Diabetes - With instruction, daughter Margreta Journey) placed and started first sensor (10 day sensor, personal version). Placed date: 112/20/2018, Scheduled off date: 09/04/2017.  -Advised patient to bring in reader and USB cord to visits for downloads.  -Advised patient to avoid high dose salicylic acid and vitamin C products while wearing sensor, and continue checking blood glucose if he doubts the sensor read and taking medications as normal.  -Will return to clinic in early January to assess data and adjust medications as needed. -Sent patient home with extra tegaderm and skintac wipes

## 2017-08-24 NOTE — Patient Instructions (Signed)
Please come back in January.

## 2017-08-24 NOTE — Progress Notes (Signed)
    S:     Chief Complaint  Patient presents with  . Medication Management    CGM placement - pt supplied     Patient arrives in good spirits with daughter, Margreta Journey who helps him with home health-type needs like changing of dressings on his LVAD wire.  Presents for teaching on the [employer] Continuous Glucose Monitoring System. Patient picked up 1 month supply from pharmacy and states that it is NOT too expensive. Patient was referred on 08/09/2017.  Patient was last seen by Primary Care Provider on 08/09/2017.   He reports his DM has been doing "good". Denies any hypoglycemia and reports adherence to medications. Is excited to start the CGM.  Lab Results  Component Value Date   HGBA1C 10.1 (H) 06/07/2017   A/P: Diabetes - With instruction, daughter Margreta Journey) placed and started first sensor (10 day sensor, personal version). Placed date: 112/20/2018, Scheduled off date: 09/04/2017.  -Advised patient to bring in reader and USB cord to visits for downloads.  -Advised patient to avoid high dose salicylic acid and vitamin C products while wearing sensor, and continue checking blood glucose if he doubts the sensor read and taking medications as normal.  -Will return to clinic in early January to assess data and adjust medications as needed. -Sent patient home with extra tegaderm and skintac wipes   Written patient instructions declined by patient.  Total time in face to face counseling 15 minutes.   Follow up in Pharmacist Clinic Visit Jan 2019.   Patient seen with Deirdre Pippins, PGY2 Pharmacy Resident, PharmD, BCPS

## 2017-08-25 ENCOUNTER — Encounter (HOSPITAL_COMMUNITY): Payer: BLUE CROSS/BLUE SHIELD

## 2017-08-28 ENCOUNTER — Encounter (HOSPITAL_COMMUNITY): Payer: BLUE CROSS/BLUE SHIELD

## 2017-08-30 ENCOUNTER — Encounter (HOSPITAL_COMMUNITY): Payer: BLUE CROSS/BLUE SHIELD

## 2017-09-06 ENCOUNTER — Telehealth (HOSPITAL_COMMUNITY): Payer: Self-pay | Admitting: *Deleted

## 2017-09-06 NOTE — Telephone Encounter (Signed)
Called pt to inform him we have sent referral for home INR machine and it has been approved. The machine is being shipped to his home today. He will be getting a call from [facility] to schedule training of the machine and how to report weekly INRs. Pt verbalized understanding of same.

## 2017-09-07 ENCOUNTER — Telehealth: Payer: Self-pay

## 2017-09-07 ENCOUNTER — Ambulatory Visit (HOSPITAL_COMMUNITY): Payer: Self-pay | Admitting: Pharmacist

## 2017-09-07 ENCOUNTER — Other Ambulatory Visit (HOSPITAL_COMMUNITY): Payer: Self-pay

## 2017-09-07 DIAGNOSIS — Z5181 Encounter for therapeutic drug level monitoring: Secondary | ICD-10-CM

## 2017-09-07 DIAGNOSIS — I82499 Acute embolism and thrombosis of other specified deep vein of unspecified lower extremity: Secondary | ICD-10-CM

## 2017-09-07 LAB — POCT INR: INR: 2

## 2017-09-07 NOTE — Telephone Encounter (Signed)
Spoke with pt. He is aware not to take Domperidone. Unable to reach pharmacy by phone. Faxed to discontinue order of Domperidone to [phone].

## 2017-09-07 NOTE — Telephone Encounter (Signed)
-----   Message from Loralie Champagne, PA-C sent at 08/28/2017 10:11 AM EST ----- Please cancel domperidone on this patient and let the patient know.  See Dr. Woodward Ku comment below.  Thank you,  Jess   ----- Message ----- From: Mauri Pole, MD Sent: 08/18/2017   5:02 PM To: Loralie Champagne, PA-C    ----- Message ----- From: Loralie Champagne, PA-C Sent: 08/17/2017   3:31 PM To: Mauri Pole, MD

## 2017-09-11 ENCOUNTER — Ambulatory Visit: Payer: Self-pay | Admitting: Internal Medicine

## 2017-09-12 ENCOUNTER — Other Ambulatory Visit (HOSPITAL_COMMUNITY): Payer: Self-pay | Admitting: *Deleted

## 2017-09-12 ENCOUNTER — Encounter (HOSPITAL_COMMUNITY): Payer: Self-pay

## 2017-09-12 ENCOUNTER — Ambulatory Visit (HOSPITAL_COMMUNITY)
Admission: RE | Admit: 2017-09-12 | Discharge: 2017-09-12 | Disposition: A | Discharge status: Home / Self Care | Payer: BLUE CROSS/BLUE SHIELD | Source: Ambulatory Visit | Attending: Cardiology | Admitting: Cardiology

## 2017-09-12 ENCOUNTER — Telehealth (HOSPITAL_COMMUNITY): Payer: Self-pay | Admitting: Unknown Physician Specialty

## 2017-09-12 ENCOUNTER — Ambulatory Visit (HOSPITAL_COMMUNITY): Payer: Self-pay | Admitting: Pharmacist

## 2017-09-12 ENCOUNTER — Inpatient Hospital Stay (HOSPITAL_COMMUNITY)
Admission: AD | Admit: 2017-09-12 | Discharge: 2017-09-16 | DRG: 073 | Disposition: A | Discharge status: Home / Self Care | Payer: BLUE CROSS/BLUE SHIELD | Source: Ambulatory Visit | Attending: Cardiology | Admitting: Cardiology

## 2017-09-12 VITALS — BP 140/92 | HR 79 | Resp 16 | Ht 68.0 in | Wt 173.2 lb

## 2017-09-12 DIAGNOSIS — G43A1 Cyclical vomiting, intractable: Secondary | ICD-10-CM

## 2017-09-12 DIAGNOSIS — E1151 Type 2 diabetes mellitus with diabetic peripheral angiopathy without gangrene: Secondary | ICD-10-CM | POA: Diagnosis present

## 2017-09-12 DIAGNOSIS — R112 Nausea with vomiting, unspecified: Secondary | ICD-10-CM | POA: Diagnosis present

## 2017-09-12 DIAGNOSIS — Z888 Allergy status to other drugs, medicaments and biological substances status: Secondary | ICD-10-CM | POA: Diagnosis not present

## 2017-09-12 DIAGNOSIS — N2581 Secondary hyperparathyroidism of renal origin: Secondary | ICD-10-CM | POA: Diagnosis not present

## 2017-09-12 DIAGNOSIS — N186 End stage renal disease: Secondary | ICD-10-CM

## 2017-09-12 DIAGNOSIS — Z5181 Encounter for therapeutic drug level monitoring: Secondary | ICD-10-CM

## 2017-09-12 DIAGNOSIS — Z95810 Presence of automatic (implantable) cardiac defibrillator: Secondary | ICD-10-CM

## 2017-09-12 DIAGNOSIS — R918 Other nonspecific abnormal finding of lung field: Secondary | ICD-10-CM | POA: Diagnosis present

## 2017-09-12 DIAGNOSIS — E785 Hyperlipidemia, unspecified: Secondary | ICD-10-CM | POA: Diagnosis present

## 2017-09-12 DIAGNOSIS — I5022 Chronic systolic (congestive) heart failure: Secondary | ICD-10-CM | POA: Diagnosis present

## 2017-09-12 DIAGNOSIS — I255 Ischemic cardiomyopathy: Secondary | ICD-10-CM | POA: Diagnosis present

## 2017-09-12 DIAGNOSIS — E1122 Type 2 diabetes mellitus with diabetic chronic kidney disease: Secondary | ICD-10-CM | POA: Diagnosis present

## 2017-09-12 DIAGNOSIS — Z79899 Other long term (current) drug therapy: Secondary | ICD-10-CM | POA: Diagnosis not present

## 2017-09-12 DIAGNOSIS — Z95811 Presence of heart assist device: Secondary | ICD-10-CM

## 2017-09-12 DIAGNOSIS — I4892 Unspecified atrial flutter: Secondary | ICD-10-CM | POA: Diagnosis present

## 2017-09-12 DIAGNOSIS — E1143 Type 2 diabetes mellitus with diabetic autonomic (poly)neuropathy: Secondary | ICD-10-CM

## 2017-09-12 DIAGNOSIS — Z992 Dependence on renal dialysis: Secondary | ICD-10-CM | POA: Diagnosis not present

## 2017-09-12 DIAGNOSIS — R634 Abnormal weight loss: Secondary | ICD-10-CM | POA: Diagnosis present

## 2017-09-12 DIAGNOSIS — I2510 Atherosclerotic heart disease of native coronary artery without angina pectoris: Secondary | ICD-10-CM | POA: Diagnosis present

## 2017-09-12 DIAGNOSIS — R110 Nausea: Secondary | ICD-10-CM | POA: Diagnosis not present

## 2017-09-12 DIAGNOSIS — Z794 Long term (current) use of insulin: Secondary | ICD-10-CM | POA: Diagnosis not present

## 2017-09-12 DIAGNOSIS — E889 Metabolic disorder, unspecified: Secondary | ICD-10-CM | POA: Diagnosis present

## 2017-09-12 DIAGNOSIS — I132 Hypertensive heart and chronic kidney disease with heart failure and with stage 5 chronic kidney disease, or end stage renal disease: Secondary | ICD-10-CM | POA: Diagnosis present

## 2017-09-12 DIAGNOSIS — D6851 Activated protein C resistance: Secondary | ICD-10-CM | POA: Diagnosis present

## 2017-09-12 DIAGNOSIS — K3184 Gastroparesis: Secondary | ICD-10-CM | POA: Diagnosis present

## 2017-09-12 DIAGNOSIS — I959 Hypotension, unspecified: Secondary | ICD-10-CM | POA: Diagnosis not present

## 2017-09-12 DIAGNOSIS — I252 Old myocardial infarction: Secondary | ICD-10-CM

## 2017-09-12 DIAGNOSIS — Z7901 Long term (current) use of anticoagulants: Secondary | ICD-10-CM

## 2017-09-12 DIAGNOSIS — D649 Anemia, unspecified: Secondary | ICD-10-CM | POA: Diagnosis present

## 2017-09-12 DIAGNOSIS — I10 Essential (primary) hypertension: Secondary | ICD-10-CM

## 2017-09-12 DIAGNOSIS — E1121 Type 2 diabetes mellitus with diabetic nephropathy: Secondary | ICD-10-CM | POA: Diagnosis present

## 2017-09-12 DIAGNOSIS — I82499 Acute embolism and thrombosis of other specified deep vein of unspecified lower extremity: Secondary | ICD-10-CM

## 2017-09-12 DIAGNOSIS — K219 Gastro-esophageal reflux disease without esophagitis: Secondary | ICD-10-CM | POA: Diagnosis present

## 2017-09-12 DIAGNOSIS — Z7982 Long term (current) use of aspirin: Secondary | ICD-10-CM

## 2017-09-12 DIAGNOSIS — Z955 Presence of coronary angioplasty implant and graft: Secondary | ICD-10-CM

## 2017-09-12 LAB — BASIC METABOLIC PANEL
ANION GAP: 14 (ref 5–15)
BUN: 24 mg/dL — ABNORMAL HIGH (ref 6–20)
CHLORIDE: 98 mmol/L — AB (ref 101–111)
CO2: 23 mmol/L (ref 22–32)
Calcium: 9 mg/dL (ref 8.9–10.3)
Creatinine, Ser: 5.25 mg/dL — ABNORMAL HIGH (ref 0.61–1.24)
GFR calc Af Amer: 13 mL/min — ABNORMAL LOW (ref >60)
GFR, EST NON AFRICAN AMERICAN: 11 mL/min — AB (ref >60)
GLUCOSE: 167 mg/dL — AB (ref 65–99)
POTASSIUM: 3.7 mmol/L (ref 3.5–5.1)
Sodium: 135 mmol/L (ref 135–145)

## 2017-09-12 LAB — GLUCOSE, CAPILLARY
GLUCOSE-CAPILLARY: 265 mg/dL — AB (ref 65–99)
Glucose-Capillary: 216 mg/dL — ABNORMAL HIGH (ref 65–99)

## 2017-09-12 LAB — CBC
HEMATOCRIT: 37.8 % — AB (ref 39.0–52.0)
HEMOGLOBIN: 11.6 g/dL — AB (ref 13.0–17.0)
MCH: 26 pg (ref 26.0–34.0)
MCHC: 30.7 g/dL (ref 30.0–36.0)
MCV: 84.6 fL (ref 78.0–100.0)
PLATELETS: 243 10*3/uL (ref 150–400)
RBC: 4.47 MIL/uL (ref 4.22–5.81)
RDW: 22.1 % — ABNORMAL HIGH (ref 11.5–15.5)
WBC: 7.9 10*3/uL (ref 4.0–10.5)

## 2017-09-12 LAB — PROTIME-INR
INR: 2.22
Prothrombin Time: 24.4 seconds — ABNORMAL HIGH (ref 11.4–15.2)

## 2017-09-12 LAB — LACTATE DEHYDROGENASE: LDH: 273 U/L — AB (ref 98–192)

## 2017-09-12 MED ORDER — ERYTHROMYCIN BASE 250 MG PO TABS
250.0000 mg | ORAL_TABLET | Freq: Three times a day (TID) | ORAL | Status: DC
  Administered 2017-09-13 – 2017-09-16 (×10): 250 mg via ORAL
  Filled 2017-09-12 (×13): qty 1

## 2017-09-12 MED ORDER — METOCLOPRAMIDE HCL 5 MG/ML IJ SOLN
5.0000 mg | Freq: Four times a day (QID) | INTRAMUSCULAR | Status: DC
  Administered 2017-09-12 – 2017-09-16 (×16): 5 mg via INTRAVENOUS
  Filled 2017-09-12 (×15): qty 2

## 2017-09-12 MED ORDER — PROMETHAZINE HCL 25 MG PO TABS: 25.0000 mg | ORAL_TABLET | Freq: Four times a day (QID) | ORAL | Status: DC | PRN

## 2017-09-12 MED ORDER — WARFARIN - PHARMACIST DOSING INPATIENT
Freq: Every day | Status: DC
  Administered 2017-09-12: 18:00:00
  Administered 2017-09-14: 1

## 2017-09-12 MED ORDER — HYDRALAZINE HCL 20 MG/ML IJ SOLN
10.0000 mg | Freq: Once | INTRAMUSCULAR | Status: DC
  Filled 2017-09-12: qty 0.5

## 2017-09-12 MED ORDER — WARFARIN SODIUM 5 MG PO TABS
5.0000 mg | ORAL_TABLET | Freq: Once | ORAL | Status: AC
  Administered 2017-09-12: 5 mg via ORAL
  Filled 2017-09-12: qty 1

## 2017-09-12 MED ORDER — PANTOPRAZOLE SODIUM 40 MG IV SOLR
40.0000 mg | INTRAVENOUS | Status: DC
  Administered 2017-09-12 – 2017-09-15 (×3): 40 mg via INTRAVENOUS
  Filled 2017-09-12 (×4): qty 40

## 2017-09-12 MED ORDER — SILDENAFIL CITRATE 20 MG PO TABS: 20.0000 mg | ORAL_TABLET | Freq: Three times a day (TID) | ORAL | Status: DC

## 2017-09-12 MED ORDER — HYDRALAZINE HCL 50 MG PO TABS
50.0000 mg | ORAL_TABLET | Freq: Three times a day (TID) | ORAL | Status: DC
  Administered 2017-09-13: 50 mg via ORAL
  Filled 2017-09-12 (×3): qty 1

## 2017-09-12 MED ORDER — ONDANSETRON 4 MG PO TBDP: 4.0000 mg | ORAL_TABLET | Freq: Four times a day (QID) | ORAL | Status: DC | PRN

## 2017-09-12 MED ORDER — NITROGLYCERIN IN D5W 200-5 MCG/ML-% IV SOLN
0.0000 ug/min | INTRAVENOUS | Status: DC
  Administered 2017-09-12: 5 ug/min via INTRAVENOUS
  Administered 2017-09-13 (×2): 75 ug/min via INTRAVENOUS
  Filled 2017-09-12 (×4): qty 250

## 2017-09-12 MED ORDER — ACETAMINOPHEN 325 MG PO TABS
650.0000 mg | ORAL_TABLET | ORAL | Status: DC | PRN
  Administered 2017-09-15: 650 mg via ORAL
  Filled 2017-09-12: qty 2

## 2017-09-12 MED ORDER — ASPIRIN EC 81 MG PO TBEC
81.0000 mg | DELAYED_RELEASE_TABLET | Freq: Every day | ORAL | Status: DC
  Administered 2017-09-13 – 2017-09-16 (×4): 81 mg via ORAL
  Filled 2017-09-12 (×4): qty 1

## 2017-09-12 MED ORDER — ATORVASTATIN CALCIUM 40 MG PO TABS
40.0000 mg | ORAL_TABLET | Freq: Every day | ORAL | Status: DC
  Administered 2017-09-14 – 2017-09-15 (×2): 40 mg via ORAL
  Filled 2017-09-12 (×3): qty 1

## 2017-09-12 MED ORDER — INSULIN ASPART 100 UNIT/ML ~~LOC~~ SOLN
0.0000 [IU] | Freq: Three times a day (TID) | SUBCUTANEOUS | Status: DC
  Administered 2017-09-12: 5 [IU] via SUBCUTANEOUS
  Administered 2017-09-13 (×2): 2 [IU] via SUBCUTANEOUS
  Administered 2017-09-14 (×2): 1 [IU] via SUBCUTANEOUS
  Administered 2017-09-14 – 2017-09-15 (×2): 2 [IU] via SUBCUTANEOUS
  Administered 2017-09-16 (×2): 1 [IU] via SUBCUTANEOUS

## 2017-09-12 MED ORDER — PROMETHAZINE HCL 25 MG RE SUPP: 25.0000 mg | Freq: Four times a day (QID) | RECTAL | Status: DC | PRN

## 2017-09-12 MED ORDER — SODIUM CHLORIDE 0.9 % IV SOLN: INTRAVENOUS | Status: DC | PRN

## 2017-09-12 MED ORDER — PROMETHAZINE HCL 25 MG/ML IJ SOLN: 12.5000 mg | Freq: Four times a day (QID) | INTRAMUSCULAR | Status: DC | PRN

## 2017-09-12 MED ORDER — LORAZEPAM 0.5 MG PO TABS
0.5000 mg | ORAL_TABLET | Freq: Two times a day (BID) | ORAL | Status: DC | PRN
  Administered 2017-09-14: 0.5 mg via ORAL
  Filled 2017-09-12: qty 1

## 2017-09-12 MED ORDER — HYDRALAZINE HCL 20 MG/ML IJ SOLN
10.0000 mg | INTRAMUSCULAR | Status: DC | PRN
Start: 2017-09-12 — End: 2017-09-13
  Administered 2017-09-12 – 2017-09-13 (×2): 10 mg via INTRAVENOUS
  Filled 2017-09-12 (×3): qty 1

## 2017-09-12 MED ORDER — PROMETHAZINE HCL 25 MG/ML IJ SOLN
12.5000 mg | Freq: Four times a day (QID) | INTRAMUSCULAR | Status: DC
  Administered 2017-09-12 – 2017-09-14 (×8): 12.5 mg via INTRAVENOUS
  Filled 2017-09-12 (×8): qty 1

## 2017-09-12 MED ORDER — DOCUSATE SODIUM 100 MG PO CAPS
200.0000 mg | ORAL_CAPSULE | Freq: Every day | ORAL | Status: DC
  Administered 2017-09-13 – 2017-09-16 (×4): 200 mg via ORAL
  Filled 2017-09-12 (×4): qty 2

## 2017-09-12 MED ORDER — METOCLOPRAMIDE HCL 10 MG PO TABS: 5.0000 mg | ORAL_TABLET | Freq: Three times a day (TID) | ORAL | Status: DC

## 2017-09-12 MED ORDER — HYDRALAZINE HCL 20 MG/ML IJ SOLN: 5.0000 mg | INTRAMUSCULAR | Status: DC | PRN

## 2017-09-12 MED ORDER — PANTOPRAZOLE SODIUM 40 MG PO TBEC: 40.0000 mg | DELAYED_RELEASE_TABLET | Freq: Every day | ORAL | Status: DC

## 2017-09-12 MED ORDER — LORAZEPAM 2 MG/ML IJ SOLN
1.0000 mg | INTRAMUSCULAR | Status: DC | PRN
  Administered 2017-09-12 – 2017-09-15 (×7): 1 mg via INTRAVENOUS
  Filled 2017-09-12 (×9): qty 1

## 2017-09-12 MED ORDER — AMLODIPINE BESYLATE 10 MG PO TABS
10.0000 mg | ORAL_TABLET | Freq: Every day | ORAL | Status: DC
  Administered 2017-09-13 – 2017-09-14 (×2): 10 mg via ORAL
  Filled 2017-09-12 (×2): qty 1

## 2017-09-12 MED ORDER — ONDANSETRON HCL 4 MG/2ML IJ SOLN
4.0000 mg | Freq: Four times a day (QID) | INTRAMUSCULAR | Status: DC | PRN
  Administered 2017-09-13: 4 mg via INTRAVENOUS
  Filled 2017-09-12: qty 2

## 2017-09-12 NOTE — Progress Notes (Signed)
ANTICOAGULATION CONSULT NOTE - Initial Consult  Pharmacy Consult for warfarin Indication: DVT and hx Factor V Leiden, LVAD  Allergies  Allergen Reactions  . Metformin And Related Diarrhea    Patient Measurements: Ht: 5'8" Wt: 78.6 kg  Vital Signs: BP: 140/92 (01/08 1038) Pulse Rate: 79 (01/08 1038)  Labs: Recent Labs    09/12/17 1034  HGB 11.6*  HCT 37.8*  PLT 243  LABPROT 24.4*  INR 2.22  CREATININE 5.25*    Estimated Creatinine Clearance: 15.9 mL/min (A) (by C-G formula based on SCr of 5.25 mg/dL (H)).   Medical History: Past Medical History:  Diagnosis Date  . Angina   . ASCVD (arteriosclerotic cardiovascular disease)    , Anterior infarction 2005, LAD diagonal bifurcation intervention 03/2004  . Automatic implantable cardiac defibrillator -St. Jude's       . Benign neoplasm of colon   . CHF (congestive heart failure) ([place])   . Chronic systolic heart failure ([place])   . Coronary artery disease     Widely patent previously placed stents in the left anterior   . Crohn's disease ([place])   . 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 Dialysis patient ([place])   . Dyspnea   . Gastroparesis   . GERD (gastroesophageal reflux disease)   . High cholesterol   . Hyperlipidemia   . Hypersomnolent    Previous diagnosis of narcolepsy  . Hypertension, essential   . Ischemic cardiomyopathy    Ejection fraction 15-20% catheterization 2010  . Type II or unspecified type diabetes mellitus without mention of complication, not stated as uncontrolled   . Unspecified gastritis and gastroduodenitis without mention of hemorrhage     Medications:  Scheduled:  . amLODipine  10 mg Oral Daily  . aspirin EC  81 mg Oral Daily  . atorvastatin  40 mg Oral q1800  . docusate sodium  200 mg Oral Daily  . erythromycin  250 mg Oral TID WC  . hydrALAZINE  50 mg Oral TID  . insulin aspart  0-9 Units Subcutaneous TID WC  . metoCLOPramide (REGLAN) injection  5 mg Intravenous  Q6H  . pantoprazole (PROTONIX) IV  40 mg Intravenous Q24H  . promethazine  12.5 mg Intravenous Q6H  . warfarin  5 mg Oral ONCE-1800  . Warfarin - Pharmacist Dosing Inpatient   Does not apply q1800    Assessment: 83 yom admitted with ongoing nausea and vomiting. Of note, reports weight down by 10 lbs. Patient attests that last dose of warfarin was on Sunday (1/6). Per anticoag appointment notes and patient, takes 5 mg daily.   INR today on admission is 2.22. Hgb is 11.6, with platelets WNL. LDH 273. No signs/symptoms of bleeding.   Goal of Therapy:  INR 2-3 Monitor platelets by anticoagulation protocol: Yes   Plan:  Will order warfarin 5 mg -monitor closely given ongoing N/V and possible medication interactions with erythromycin Will monitor daily INR and CBC  Doylene Canard, PharmD Clinical Pharmacist  Pager: [phone] Clinical Phone for 09/12/2017 until 3:30pm: [phone] If after 3:30pm, please call main pharmacy at [phone] 09/12/2017,1:56 PM

## 2017-09-12 NOTE — Telephone Encounter (Signed)
Received a page from pts daughter [name]. Caregiver states that the pt has been dealing with N/V and has not been able to take any meds for the last 3 days. Pt has been taking the phenergan suppositories as prescribed but they are not helping. Pt/caregiver instructed to come to clinic with possible admission.  [name] RN, BSN VAD Coordinator 24/7 Pager [phone]

## 2017-09-12 NOTE — Procedures (Signed)
Arterial Catheter Insertion Procedure Note [name] [id number] Dec 24, 1965  Procedure: Insertion of Arterial Catheter  Indications: Blood pressure monitoring  Procedure Details Consent: Risks of procedure as well as the alternatives and risks of each were explained to the (patient/caregiver).  Consent for procedure obtained. Time Out: Verified patient identification, verified procedure, site/side was marked, verified correct patient position, special equipment/implants available, medications/allergies/relevent history reviewed, required imaging and test results available.  Performed  Maximum sterile technique was used including antiseptics, cap, gloves, gown, hand hygiene, mask and sheet. Skin prep: Chlorhexidine; local anesthetic administered 20 gauge catheter was inserted into left radial artery using the Seldinger technique.  Evaluation Blood flow good; BP tracing good. Complications: No apparent complications.   Ander Purpura 09/12/2017

## 2017-09-12 NOTE — Consult Note (Signed)
[facility] Consult: 1:37 PM 09/12/2017  LOS: 0 days    Referring Provider: Dr Loralie Champagne  Primary Care Physician:  [name], MD Primary Gastroenterologist:  Dr. [name] >> [name]    Reason for Consultation: Uncontrolled nausea and vomiting.   HPI: [name] a 52 y.o.male.Type 2 DM, longstanding. CAD. MIs in 2005, with stenting. CHF/cardiogenic shock, Non-STEMI leading to cardiac DES 11/2016. Plavix added (but since stopped) to Coumadin then. S/p ICD 2010. S/p 06/12/17 LVAD.  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 Factor V Leiden. Pleural effusion, left thoracentesis 06/28/17. Multiple tooth extractions 06/2017. 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 Lung nodules. ADHD. Narcolepsy.  Right inguinal hernia repair age 52.Gangrenous appendicitis with perforation, abscess, peritonitis, s/p ascending colectomy/appendectomy diverting ileostomy 02/2002. Ileostomy reversed 06/2002, post op course prolonged by N/V. Pt has no knowledge of "fetalsurgery for congenital hernia"listed in surgical hx.   2002 Colonoscopy: inflammatory ano-rectal polyps c/w anal prolapse. Serologic markers positive for Crohns, so possible rectal Crohn's 2009 Colonoscopy for IDA, FOBT +, diarrhea. Previous right colectomy. Patent anastomosis. Left sided tics. Inflammatory rectal polyps, path: mucosal prolapse with focal ulceration. 2009 EGD. Nodular gastritis in cardia, antrum. Biopsy: minor chronic gastritis, no villous atrophy or changes of sprue. 11/2011 GES: Delayed emptying.85% retention at 60 minutes. 72% retention at 120 minutes. 04/28/17 CT ab/pelvis pre VAD: Esophagus, biliary tract, GB, liver, pancreas unremarkable.  Intermittent N/V for years. Has had  intermittent elevation on Alk phos and transaminases.  Normal Lipase.   During 06/07/17-07/04/17 heart failure andLVAD admission, transfused with multiple blood products including 4 units PRBCs, platelets,and FFP.  Admitted 11/5 - 07/16/17 with nausea, vomiting: gastroparesis.   CT ab/pelvis: post-surgical right colon.  "dense" GB. Trace pericardial flui and small to moderate left pleural effusion.  LVAD and pacemaker in place.  Ultrasound abdomen: normal.  3 mm CBD.   Dr Hilarie Fredrickson felt N/V likely due to gastroparesis.   07/12/17 EGD (diagnostic as INR 5.4): 2 cm HH.  Fluid filled stomach, aspirated.   Discharged on PPI, Erythromycin 250 tid for 3 days, Reglan 5 mg po tid (low dose due to ESRD), ASA 325 mg q day.   Admission with recurrent  N/V on 11/26 - 08/04/17.  Dr Fuller Plan recommended: Optimize GI medications as follows: Metoclopramide 10 mg qid, ac and hs, IV for now and change to PO as symptoms improve Erythromycin 250 mg po tid, ac/hs Pantoprazole 40 mg po qam Zofran 4 mg q6h prn, IV for now and change to PO as symptoms improve Phenergan also added, IV worked better than PO.   Domperidone contraindicated due to prolonged QTc.    GI ROV 1/13 with PA-C, Dr Delane Ginger, doing ok and MD rec'd continuing low dose Reglan 5 mg ACHS, Protonix 40 mg and gastroparesis diet.   Recurrent acute N/V for last 3 days, not tolerating meds/po.  Phenergan PR RXd but not helping.  Seen in HF clinic today, admitted by Dr Aundra Dubin. Denies signif abd pain, bilious to clear emesis.  Doing well with no n/v  until 3 days ago.  No constipation or diarrhea.  Current weight of 173 #, reflects 10 #drop in 3 weeks.      Past Medical History:  Diagnosis Date  . Angina   . ASCVD (arteriosclerotic cardiovascular disease)    , Anterior infarction 2005, LAD diagonal bifurcation intervention 03/2004  . Automatic implantable cardiac defibrillator -St. Jude's       . Benign neoplasm of colon   . CHF (congestive heart failure)  ([place])   . Chronic systolic heart failure ([place])   . Coronary artery disease     Widely patent previously placed stents in the left anterior   . Crohn's disease ([place])   . 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 Dialysis patient ([place])   . Dyspnea   . Gastroparesis   . GERD (gastroesophageal reflux disease)   . High cholesterol   . Hyperlipidemia   . Hypersomnolent    Previous diagnosis of narcolepsy  . Hypertension, essential   . Ischemic cardiomyopathy    Ejection fraction 15-20% catheterization 2010  . Type II or unspecified type diabetes mellitus without mention of complication, not stated as uncontrolled   . Unspecified gastritis and gastroduodenitis without mention of hemorrhage     Past Surgical History:  Procedure Laterality Date  . APPENDECTOMY    . AV FISTULA PLACEMENT Right 06/26/2017   Procedure: ARTERIOVENOUS (AV) FISTULA CREATION VERSUS GRAFT RIGHT  ARM;  Surgeon: Conrad Doran, MD;  Location: [facility];  Service: Vascular;  Laterality: Right;  . CARDIAC DEFIBRILLATOR PLACEMENT  2010   St. Jude ICD  . COLECTOMY  ~ 2003   "for Crohn's" ascending colon.    . CORONARY STENT INTERVENTION N/A 11/25/2016   Procedure: Coronary Stent Intervention;  Surgeon: Leonie Man, MD;  Location: [facility];  Service: Cardiovascular;  Laterality: N/A;  . EP IMPLANTABLE DEVICE N/A 09/14/2016   Procedure: ICD Generator Changeout;  Surgeon: Deboraha Sprang, MD;  Location: [facility];  Service: Cardiovascular;  Laterality: N/A;  . ESOPHAGOGASTRODUODENOSCOPY N/A 07/12/2017   Procedure: ESOPHAGOGASTRODUODENOSCOPY (EGD);  Surgeon: Jerene Bears, MD;  [facility];  Service: Gastroenterology;  Laterality: N/A;  VAD pt so VAD team will need to accompany pt.   [name] FETAL SURGERY FOR CONGENITAL HERNIA     ????? pt has no knowledge of this..    . INGUINAL HERNIA REPAIR    . INSERTION OF DIALYSIS CATHETER Right 06/26/2017   Procedure: INSERTION OF TUNNELED   DIALYSIS CATHETER RIGHT INTERNAL JUGULAR;  Surgeon: Conrad Boulder, MD;  Location: [facility];  Service: Vascular;  Laterality: Right;  . INSERTION OF IMPLANTABLE LEFT VENTRICULAR ASSIST DEVICE N/A 06/12/2017   Procedure: INSERTION OF IMPLANTABLE LEFT VENTRICULAR ASSIST [place] 3;  Surgeon: Ivin Poot, MD;  Location: [facility];  Service: Open Heart Surgery;  Laterality: N/A;  HM3 LVAD  CIRC ARREST  NITRIC OXIDE  . IR THORACENTESIS ASP PLEURAL SPACE W/IMG GUIDE  06/28/2017  . MULTIPLE EXTRACTIONS WITH ALVEOLOPLASTY N/A 06/08/2017   Procedure: MULTIPLE EXTRACTION WITH ALVEOLOPLASTY AND PRE PROSTHETIC SURGERY AS NEEDED;  Surgeon: Lenn Cal, DDS;  Location: [facility];  Service: Oral Surgery;  Laterality: N/A;  . RIGHT HEART CATH N/A 06/07/2017   Procedure: RIGHT HEART CATH;  Surgeon: Larey Dresser, MD;  Location: [facility];  Service: Cardiovascular;  Laterality: N/A;  . RIGHT/LEFT HEART CATH AND CORONARY ANGIOGRAPHY N/A 11/23/2016   Procedure: Right/Left Heart Cath and Coronary  Angiography;  Surgeon: Larey Dresser, MD;  Location: [facility];  Service: Cardiovascular;  Laterality: N/A;  . TEE WITHOUT CARDIOVERSION N/A 06/12/2017   Procedure: TRANSESOPHAGEAL ECHOCARDIOGRAM (TEE);  Surgeon: Prescott Gum, Collier Salina, MD;  Location: [facility];  Service: Open Heart Surgery;  Laterality: N/A;    Prior to Admission medications   Medication Sig Start Date End Date Taking? Authorizing Provider  amLODipine (NORVASC) 10 MG tablet Take 1 tablet (10 mg total) by mouth daily. Take 1 tablet (73m) on Tuesday, Thursday, Saturday and Sunday (non HD days) by mouth 08/22/17   MLarey Dresser MD  aspirin EC 81 MG tablet Take 1 tablet (81 mg total) by mouth daily. 07/26/17   MLarey Dresser MD  atorvastatin (LIPITOR) 40 MG tablet Take 1 tablet (40 mg total) daily at 6 PM by mouth. 07/16/17   Bhagat, Bhavinkumar, PA  Continuous Blood Gluc Sensor ([place] MISC Apply 1 reader to  the skin every 10 days. 08/17/17   HZenia Resides MD  docusate sodium (COLACE) 100 MG capsule Take 2 capsules (200 mg total) by mouth daily. 07/03/17   Clegg, Amy D, NP  glucose blood (FREESTYLE PRECISION NEO TEST) test strip Use as instructed 08/17/17   HZenia Resides MD  hydrALAZINE (APRESOLINE) 50 MG tablet Take 1 tablet (50 mg total) by mouth 3 (three) times daily. 08/08/17   MLarey Dresser MD  insulin aspart (NOVOLOG) 100 UNIT/ML injection Inject 3 Units into the skin 3 (three) times daily with meals. Patient taking differently: Inject 3-5 Units into the skin 3 (three) times daily with meals.  07/03/17   Clegg, Amy D, NP  insulin glargine (LANTUS) 100 UNIT/ML injection Inject 0.05 mLs (5 Units total) into the skin daily. 07/03/17   Clegg, Amy D, NP  LORazepam (ATIVAN) 0.5 MG tablet Take 1 tablet (0.5 mg total) by mouth every 12 (twelve) hours as needed for anxiety or sleep. 08/04/17   Clegg, Amy D, NP  metoCLOPramide (REGLAN) 5 MG tablet Take 1 tablet (5 mg total) by mouth 4 (four) times daily -  before meals and at bedtime. 08/04/17   Clegg, Amy D, NP  ondansetron (ZOFRAN ODT) 4 MG disintegrating tablet Take 1 tablet (4 mg total) by mouth every 6 (six) hours as needed for nausea or vomiting. 01/10/17   [place], Amy S, PA-C  pantoprazole (PROTONIX) 40 MG tablet Take 40 mg by mouth daily.    [provider]  promethazine (PHENERGAN) 25 MG suppository Place 25 mg rectally every 6 (six) hours as needed for nausea or vomiting.    [provider]  promethazine (PHENERGAN) 25 MG tablet Take 1 tablet (25 mg total) by mouth every 6 (six) hours as needed for nausea or vomiting. 08/08/17   MLarey Dresser MD  sildenafil (REVATIO) 20 MG tablet Take 1 tablet (20 mg total) by mouth 3 (three) times daily. 07/26/17   MLarey Dresser MD  warfarin (COUMADIN) 5 MG tablet Take 5 mg by mouth daily.    [provider]    Scheduled Meds: . amLODipine  10 mg Oral Daily  .  aspirin EC  81 mg Oral Daily  . atorvastatin  40 mg Oral q1800  . docusate sodium  200 mg Oral Daily  . erythromycin  250 mg Oral TID WC  . hydrALAZINE  50 mg Oral TID  . insulin aspart  0-9 Units Subcutaneous TID WC  . metoCLOPramide  5 mg Oral TID AC &  HS  . pantoprazole  40 mg Oral Daily   Infusions: . sodium chloride    . nitroGLYCERIN     PRN Meds: Place/Maintain arterial line **AND** sodium chloride, acetaminophen, hydrALAZINE, LORazepam, LORazepam, ondansetron (ZOFRAN) IV, ondansetron, promethazine, promethazine, promethazine   Allergies as of 09/12/2017 - Review Complete 09/12/2017  Allergen Reaction Noted  . Metformin and related Diarrhea 06/03/2013    Family History  Problem Relation Age of Onset  . Colon polyps Mother   . Diabetes Mother   . Heart attack Father   . Diabetes Father   . Stroke Paternal Grandfather   . Colitis Sister   . Heart disease Brother   . Colitis Sister   . Colon cancer Neg Hx     Social History   Socioeconomic History  . Marital status: Married    Spouse name: Not on file  . Number of children: Not on file  . Years of education: Not on file  . Highest education level: Not on file  Social Needs  . Financial resource strain: Not on file  . Food insecurity - worry: Not on file  . Food insecurity - inability: Not on file  . Transportation needs - medical: Not on file  . Transportation needs - non-medical: Not on file  Occupational History  . Not on file  Tobacco Use  . Smoking status: Never Smoker  . Smokeless tobacco: Never Used  Substance and Sexual Activity  . Alcohol use: No  . Drug use: No  . Sexual activity: No  Other Topics Concern  . Not on file  Social History Narrative  . Not on file    REVIEW OF SYSTEMS: Constitutional: Feels terrible, weak, tired. ENT:  No nose bleeds Pulm: No trouble breathing.  No cough. CV:  No palpitations, no LE edema.  GU:  No hematuria, no frequency GI:  Per HPI Heme: Denies  excessive or unusual bleeding or bruising. Transfusions:  Per HPI Neuro:  No headaches, no peripheral tingling or numbness Derm:  No itching, no rash or sores.  Endocrine:  No sweats or chills.  No polyuria or dysuria Immunization:  Reviewed.  Flu shot 06/2017 Travel:  None beyond local counties in last few months.    PHYSICAL EXAM: Vital signs in last 24 hours: There were no vitals filed for this visit. Wt Readings from Last 3 Encounters:  09/12/17 78.6 kg (173 lb 3.2 oz)  08/22/17 83.4 kg (183 lb 12.8 oz)  08/17/17 84.8 kg (187 lb)   General: Looks ill, thin, miserable.  Actively retching during the exam. Head: No facial asymmetry, swelling or signs of head trauma. Eyes: No scleral icterus.  No conjunctival pallor.  EOMI Ears: Not hard of hearing Nose: No discharge. Mouth: Oral mucosa moist, clear, pink.  Tongue midline. Neck: No JVD, no bruits, masses, thyromegaly. Lungs: Clear bilaterally.  No labored breathing or cough. Heart: LVAD hum and mechanical heart sounds present. Abdomen: Nontender, nondistended.  No masses, no bruits.  Driveline site C/D/I.   Rectal: Deferred Musc/Skeltl: No significant joint swelling or deformities. Extremities: No CCE.  Both feet are warm. Neurologic: Alert.  Oriented x3.  Moves all 4 limbs.  No tremor. Skin: No suspicious rash, sores, lesions. Nodes: No cervical or inguinal adenopathy. Psych: Flat affect consistent with his acute illness.  Cooperative.  Intake/Output from previous day: No intake/output data recorded. Intake/Output this shift: No intake/output data recorded.  LAB RESULTS: Recent Labs    09/12/17 1034  WBC 7.9  HGB 11.6*  HCT 37.8*  PLT 243   BMET Lab Results  Component Value Date   NA 135 09/12/2017   NA 137 08/23/2017   NA 133 (L) 08/04/2017   K 3.7 09/12/2017   K 4.6 08/23/2017   K 3.4 (L) 08/04/2017   CL 98 (L) 09/12/2017   CL 102 08/23/2017   CL 97 (L) 08/04/2017   CO2 23 09/12/2017   CO2 25  08/23/2017   CO2 26 08/04/2017   GLUCOSE 167 (H) 09/12/2017   GLUCOSE 39 (LL) 08/23/2017   GLUCOSE 86 08/04/2017   BUN 24 (H) 09/12/2017   BUN 39 (H) 08/23/2017   BUN 23 (H) 08/04/2017   CREATININE 5.25 (H) 09/12/2017   CREATININE 6.67 (H) 08/23/2017   CREATININE 5.59 (H) 08/04/2017   CALCIUM 9.0 09/12/2017   CALCIUM 8.8 (L) 08/23/2017   CALCIUM 8.0 (L) 08/04/2017   LFT No results for input(s): PROT, ALBUMIN, AST, ALT, ALKPHOS, BILITOT, BILIDIR, IBILI in the last 72 hours. PT/INR Lab Results  Component Value Date   INR 2.22 09/12/2017   INR 2.0 09/07/2017   INR 1.96 08/16/2017   Lipase     Component Value Date/Time   LIPASE 17 04/07/2017 0500    RADIOLOGY STUDIES: No results found.    IMPRESSION:   *  Gastroparesis with recurrent acute, refractory NV.   His ESRD and prolonged QTc have limited his outpt mgt to low dose Reglan, PPI which normally work well, as between episodes he denies issues with nausea, vomiting.    *  ESRD  *  CHF.  LVAD in 06/2017.  On chronic Coumadin, INR therapeutic.     PLAN:     *  Supportive care.   Switch prn forms of phenergan to scheduled IV.  Switch to scheduled IV in place of PO Reglan.  IV protonix in place of PO.  IV erythromycin would need to be special ordered my pharmacist, it is not in stock, so continue oral for now.   Agree with prn Ativan, the IV formulation seems to help when his symptoms are this bad.  Azucena Freed  09/12/2017, 1:37 PM Pager: [phone]  GI ATTENDING  History, laboratories, x-rays, prior endoscopy reports reviewed. Agree with excellent comprehensive consultation note as outlined above. Unfortunate gentleman with multiple significant medical problems. This is the Third time he has been admitted and seen by GI for intractable nausea and vomiting in the past 2 months. The problem is Known diabetic gastroparesis. He is on no medical therapies that would exacerbate his symptoms. He is on maximal known available  therapies to treat his symptoms. Agree with converting medications to IV form until things settle down and he is able to resume adequate by mouth intake. Limited diet to be advanced as tolerated. In reviewing the chart I noticed that his last hemoglobin A1c was 10.1. This is POOR GLYCEMIC CONTROL. Certainly improvement in gastric performance is related to glycemic control. Thus, he needs to work closely with his diabetologist toward improved glycemic control. If his glycemic control is significantly improved but he continues to be symptomatic, an outside opinion from Dr. Derrill Kay at [facility] (gastroparesis and motility disorder expert) could be considered.). His primary GI, Dr. Silverio Decamp, could direct this if needed. Nothing additional to add at this time. Please call for questions or new problems.  Docia Chuck. Geri Seminole., M.D. [facility] Division of Gastroenterology

## 2017-09-12 NOTE — H&P (Signed)
Advanced Heart Failure VAD History and Physical Note   Reason for Admission: Uncontrolled Nausea/Vomiting    HPI:   presents for followup of CHF/LVAD.  Patient developed low output heart failure.  He was not deemed to be a good transplant candidate.  He was admitted in 10/18 and Heartmate 3 LVAD was placed.  He had baseline CKD stage 3 likely from diabetic nephropathy and CHF.  He developed peri-op AKI and ended up on dialysis.  No renal recovery to date.  St Jude ICD has been turned back on and ramp echo was done prior to discharge in 10/18, speed increased to 5600 rpm.    He was admitted in 11/18 with nausea/vomiting and abdominal pain.  This appears to have been due to diabetic gastroparesis (extensive GI evaluation). He had atrial flutter this admission that was converted to NSR with amiodarone.  He improved considerably with scheduled Reglan and erythromycin. He was re-admitted later in 11/18 with the same symptoms, suspect diabetic gastroparesis and was again treated with erythromycin.  He was not able to start domperidone due to prolonged QT interval. He had nausea again about a week ago but was able to manage at home with Phenergan suppositories. He was also on erythromycin for an additional 7 days to promote motility.   Today he presented for an acute visit due to ongoing nausea/vomiting. Over the last 48 hours he has had increased N/V. Unable to eat or take medications since Sunday. Feels terrible. Weight at home has gone down 10 pounds from 183>173 pounds. Denies SOB. No N/V. He has been taking phenergan suppositories. He had HD yesterday but felt terrible. No BRBPR. Denies LVAD alarms.  Denies driveline trauma, erythema or drainage.  Denies ICD shocks.    Reports taking Coumadin as prescribed and adherence to anticoagulation based dietary restrictions.  Denies bright red blood per rectum or melena, no dark urine or hematuria.    LVAD INTERROGATION:  HeartMate II LVAD:  Flow 3.7  liters/min, speed 5600, power 4.3, PI 7.5.     Review of Systems: [y] = yes, [ ]  = no   General: Weight gain [ ] ; Weight loss [Y ]; Anorexia [ ] ; Fatigue [Y ]; Fever [ ] ; Chills [ ] ; Weakness [ ]   Cardiac: Chest pain/pressure [ ] ; Resting SOB [ ] ; Exertional SOB [ ] ; Orthopnea [ ] ; Pedal Edema [ ] ; Palpitations [ ] ; Syncope [ ] ; Presyncope [ ] ; Paroxysmal nocturnal dyspnea[ ]   Pulmonary: Cough [ ] ; Wheezing[ ] ; Hemoptysis[ ] ; Sputum [ ] ; Snoring [ ]   GI: Vomiting[ Y]; Dysphagia[ ] ; Melena[ ] ; Hematochezia [ ] ; Heartburn[ ] ; Abdominal pain [ ] ; Constipation [ ] ; Diarrhea [ ] ; BRBPR [ ]   GU: Hematuria[ ] ; Dysuria [ ] ; Nocturia[ ]   Vascular: Pain in legs with walking [ ] ; Pain in feet with lying flat [ ] ; Non-healing sores [ ] ; Stroke [ ] ; TIA [ ] ; Slurred speech [ ] ;  Neuro: Headaches[ ] ; Vertigo[ ] ; Seizures[ ] ; Paresthesias[ ] ;Blurred vision [ ] ; Diplopia [ ] ; Vision changes [ ]   Ortho/Skin: Arthritis [ ] ; Joint pain [ ] ; Muscle pain [ ] ; Joint swelling [ ] ; Back Pain [ ] ; Rash [ ]   Psych: Depression[ ] ; Anxiety[ ]   Heme: Bleeding problems [ ] ; Clotting disorders [ ] ; Anemia [ ]   Endocrine: Diabetes [ Y]; Thyroid dysfunction[ ]     Home Medications Prior to Admission medications   Medication Sig Start Date End Date Taking? Authorizing Provider  amLODipine (NORVASC) 10  MG tablet Take 1 tablet (10 mg total) by mouth daily. Take 1 tablet (93m) on Tuesday, Thursday, Saturday and Sunday (non HD days) by mouth 08/22/17   MLarey Dresser MD  aspirin EC 81 MG tablet Take 1 tablet (81 mg total) by mouth daily. 07/26/17   MLarey Dresser MD  atorvastatin (LIPITOR) 40 MG tablet Take 1 tablet (40 mg total) daily at 6 PM by mouth. 07/16/17   Bhagat, Bhavinkumar, PA  Continuous Blood Gluc Sensor ([place] MISC Apply 1 reader to the skin every 10 days. 08/17/17   HZenia Resides MD  docusate sodium (COLACE) 100 MG capsule Take 2 capsules (200 mg total) by mouth daily. 07/03/17    Janielle Mittelstadt D, NP  glucose blood (FREESTYLE PRECISION NEO TEST) test strip Use as instructed 08/17/17   HZenia Resides MD  hydrALAZINE (APRESOLINE) 50 MG tablet Take 1 tablet (50 mg total) by mouth 3 (three) times daily. 08/08/17   MLarey Dresser MD  insulin aspart (NOVOLOG) 100 UNIT/ML injection Inject 3 Units into the skin 3 (three) times daily with meals. Patient taking differently: Inject 3-5 Units into the skin 3 (three) times daily with meals.  07/03/17   Janyth Riera D, NP  insulin glargine (LANTUS) 100 UNIT/ML injection Inject 0.05 mLs (5 Units total) into the skin daily. 07/03/17   Berlie Persky D, NP  LORazepam (ATIVAN) 0.5 MG tablet Take 1 tablet (0.5 mg total) by mouth every 12 (twelve) hours as needed for anxiety or sleep. 08/04/17   Latia Mataya D, NP  metoCLOPramide (REGLAN) 5 MG tablet Take 1 tablet (5 mg total) by mouth 4 (four) times daily -  before meals and at bedtime. 08/04/17   Audric Venn D, NP  ondansetron (ZOFRAN ODT) 4 MG disintegrating tablet Take 1 tablet (4 mg total) by mouth every 6 (six) hours as needed for nausea or vomiting. 01/10/17   Esterwood, Madeleine Fenn S, PA-C  pantoprazole (PROTONIX) 40 MG tablet Take 40 mg by mouth daily.    [provider]  promethazine (PHENERGAN) 25 MG suppository Place 25 mg rectally every 6 (six) hours as needed for nausea or vomiting.    [provider]  promethazine (PHENERGAN) 25 MG tablet Take 1 tablet (25 mg total) by mouth every 6 (six) hours as needed for nausea or vomiting. 08/08/17   MLarey Dresser MD  sildenafil (REVATIO) 20 MG tablet Take 1 tablet (20 mg total) by mouth 3 (three) times daily. 07/26/17   MLarey Dresser MD  warfarin (COUMADIN) 5 MG tablet Take 5 mg by mouth daily.    [provider]    Past Medical History: Past Medical History:  Diagnosis Date  . Angina   . ASCVD (arteriosclerotic cardiovascular disease)    , Anterior infarction 2005, LAD diagonal bifurcation intervention 03/2004  .  Automatic implantable cardiac defibrillator -St. Jude's       . Benign neoplasm of colon   . CHF (congestive heart failure) ([place]   . Chronic systolic heart failure ([place]   . Coronary artery disease     Widely patent previously placed stents in the left anterior   . Crohn's disease ([place]   . 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 [place]   . Dyspnea   . Gastroparesis   . GERD (gastroesophageal reflux disease)   . High cholesterol   . Hyperlipidemia   . Hypersomnolent    Previous diagnosis of narcolepsy  .  Hypertension, essential   . Ischemic cardiomyopathy    Ejection fraction 15-20% catheterization 2010  . Type II or unspecified type diabetes mellitus without mention of complication, not stated as uncontrolled   . Unspecified gastritis and gastroduodenitis without mention of hemorrhage     Past Surgical History: Past Surgical History:  Procedure Laterality Date  . APPENDECTOMY    . AV FISTULA PLACEMENT Right 06/26/2017   Procedure: ARTERIOVENOUS (AV) FISTULA CREATION VERSUS GRAFT RIGHT  ARM;  Surgeon: Conrad [place], MD;  Location: [place];  Service: Vascular;  Laterality: Right;  . CARDIAC DEFIBRILLATOR PLACEMENT  2010   St. Jude ICD  . COLECTOMY  ~ 2003   "for Crohn's" ascending colon.    . CORONARY STENT INTERVENTION N/A 11/25/2016   Procedure: Coronary Stent Intervention;  Surgeon: Leonie Man, MD;  Location: [place] [facility];  Service: Cardiovascular;  Laterality: N/A;  . EP IMPLANTABLE DEVICE N/A 09/14/2016   Procedure: ICD Generator Changeout;  Surgeon: Deboraha Sprang, MD;  Location: [place] [facility];  Service: Cardiovascular;  Laterality: N/A;  . ESOPHAGOGASTRODUODENOSCOPY N/A 07/12/2017   Procedure: ESOPHAGOGASTRODUODENOSCOPY (EGD);  Surgeon: Jerene Bears, MD;  Location: [facility] ENDOSCOPY;  Service: Gastroenterology;  Laterality: N/A;  VAD pt so VAD team will need to accompany pt.   [name] FETAL SURGERY FOR CONGENITAL HERNIA     ????? pt  has no knowledge of this..    . INGUINAL HERNIA REPAIR    . INSERTION OF DIALYSIS CATHETER Right 06/26/2017   Procedure: INSERTION OF TUNNELED  DIALYSIS CATHETER RIGHT INTERNAL JUGULAR;  Surgeon: Conrad , MD;  Location: [place];  Service: Vascular;  Laterality: Right;  . INSERTION OF IMPLANTABLE LEFT VENTRICULAR ASSIST DEVICE N/A 06/12/2017   Procedure: INSERTION OF IMPLANTABLE LEFT VENTRICULAR ASSIST [place];  Surgeon: Ivin Poot, MD;  Location: [place];  Service: Open Heart Surgery;  Laterality: N/A;  HM3 LVAD  CIRC ARREST  NITRIC OXIDE  . IR THORACENTESIS ASP PLEURAL SPACE W/IMG GUIDE  06/28/2017  . MULTIPLE EXTRACTIONS WITH ALVEOLOPLASTY N/A 06/08/2017   Procedure: MULTIPLE EXTRACTION WITH ALVEOLOPLASTY AND PRE PROSTHETIC SURGERY AS NEEDED;  Surgeon: Lenn Cal, DDS;  Location: [place];  Service: Oral Surgery;  Laterality: N/A;  . RIGHT HEART CATH N/A 06/07/2017   Procedure: RIGHT HEART CATH;  Surgeon: Larey Dresser, MD;  Location: [place] [facility];  Service: Cardiovascular;  Laterality: N/A;  . RIGHT/LEFT HEART CATH AND CORONARY ANGIOGRAPHY N/A 11/23/2016   Procedure: Right/Left Heart Cath and Coronary Angiography;  Surgeon: Larey Dresser, MD;  Location: [place] [facility];  Service: Cardiovascular;  Laterality: N/A;  . TEE WITHOUT CARDIOVERSION N/A 06/12/2017   Procedure: TRANSESOPHAGEAL ECHOCARDIOGRAM (TEE);  Surgeon: Prescott Gum, Collier [place], MD;  Location: [place];  Service: Open Heart Surgery;  Laterality: N/A;    Family History: Family History  Problem Relation Age of Onset  . Colon polyps Mother   . Diabetes Mother   . Heart attack Father   . Diabetes Father   . Stroke Paternal Grandfather   . Colitis Sister   . Heart disease Brother   . Colitis Sister   . Colon cancer Neg Hx     Social History: Social History   Socioeconomic History  . Marital status: Married    Spouse name: Not on file  . Number of children: Not on file  . Years of education:  Not on file  . Highest education level: Not on file  Social Needs  .  Financial resource strain: Not on file  . Food insecurity - worry: Not on file  . Food insecurity - inability: Not on file  . Transportation needs - medical: Not on file  . Transportation needs - non-medical: Not on file  Occupational History  . Not on file  Tobacco Use  . Smoking status: Never Smoker  . Smokeless tobacco: Never Used  Substance and Sexual Activity  . Alcohol use: No  . Drug use: No  . Sexual activity: No  Other Topics Concern  . Not on file  Social History Narrative  . Not on file    Allergies:  Allergies  Allergen Reactions  . Metformin And Related Diarrhea    Objective:    Vital Signs:   BP: ()/()  Arterial Line BP: ()/()    There were no vitals filed for this visit.  Mean arterial Pressure 140  Physical Exam    General:  Vomiting. Appears chronically ill. no resp difficulty HEENT: Normal Neck: supple. JVP 5-6  . Carotids 2+ bilat; no bruits. No lymphadenopathy or thyromegaly appreciated. Cor: Mechanical heart sounds with LVAD hum present. Lungs: Clear Abdomen: soft, nontender, nondistended. No hepatosplenomegaly. No bruits or masses. Good bowel sounds. Driveline: C/D/I; securement device intact and driveline incorporated Extremities: no cyanosis, clubbing, rash, edema Neuro: alert & orientedx3, cranial nerves grossly intact. moves all 4 extremities w/o difficulty. Affect pleasant   Telemetry    EKG     Labs    Basic Metabolic Panel: Recent Labs  Lab 09/12/17 1034  NA 135  K 3.7  CL 98*  CO2 23  GLUCOSE 167*  BUN 24*  CREATININE 5.25*  CALCIUM 9.0    Liver Function Tests: No results for input(s): AST, ALT, ALKPHOS, BILITOT, PROT, ALBUMIN in the last 168 hours. No results for input(s): LIPASE, AMYLASE in the last 168 hours. No results for input(s): AMMONIA in the last 168 hours.  CBC: Recent Labs  Lab 09/12/17 1034  WBC 7.9  HGB 11.6*  HCT 37.8*    MCV 84.6  PLT 243    Cardiac Enzymes: No results for input(s): CKTOTAL, CKMB, CKMBINDEX, TROPONINI in the last 168 hours.  BNP: BNP (last 3 results) Recent Labs    06/18/17 2305 06/26/17 0004 07/03/17 0037  BNP 746.0* 1,409.3* 758.9*    ProBNP (last 3 results) No results for input(s): PROBNP in the last 8760 hours.   CBG: No results for input(s): GLUCAP in the last 168 hours.  Coagulation Studies: Recent Labs    09/12/17 1034  LABPROT 24.4*  INR 2.22    Other results: EKG: once nausea/vomiting better.   Imaging     No results found.    Patient Profile:    52 yo with history of CAD s/p anterior MI in 2010 and NSTEMI in 3/18 with DES to pRCA and mLAD and ischemic cardiomyopathy  s/p Heartmate 3 LVAD, diabetes gastroparesis,  and ESRD.   Admitted with uncontrolled nausea and vomiting.    Assessment/Plan:    1. Uncontrolled Vomiting, diabetic gastroparesis.  Continue phenergan suppositories. Continue reglan  5 mg tid AC.  Last admit he was placed on erythromycin to promote motility while hospitalized and 7 days post discharge.  - Consult GI.   2. . Chronic systolic CHF: Ischemic cardiomyopathy. St Jude ICD. NSTEMI in March 2018 with DES to LAD and RCA, complicated by cardiogenic shock, low output requiring milrinone. Echo 8/18 with EF 20-25%, moderate MR. PX (8/18) with severe functional impairment due to HF.  He is now s/p HM3 LVAD placement. LVAD parameters reviewed today and stable.  -Maps elevated in the setting N/V.  -Add IV hydralazine.   -Unable to take Po med. MAP elelvated. Will add IV hydralazine until he can take po medications.  - Continue ASA 81.   - INR 2.2Continue warfarin INR 2-3 for now with no overt GI bleeding. Pharmacy consult.  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  2. ESRD: Developed peri-operatively after HM3 placement.  He is tolerating HD without problems currently. Had HD 09/11/2017  Consult Nephrology while  hospitalized.  3. CAD: NSTEMI in 3/18. LHC with 99% ulcerated lesion proximal RCA with left to right collaterals, 95% mid LAD stenosis after mid LAD stent. s/p PCI to RCA and LAD on 11/25/16. No exertional chest pain.  - Continue statin and ASA. 4. h/o DVTs: Factor V Leiden heterozygote.  On warfarin.  5.. DM: Start sliding scale.   6. HTN: MAP mildly elevated.  Add IV hydralazine until he can take pos.   7. PAD: Moderate bilateral disease on ABIs in 8/18.  Denies claudication currently, no pedal ulcers.  8. Atrial flutter: Get EKG.  9. Weight loss- in the setting N/V. Watch closely.    Admit to 2 C today. Consult Nephrology for HD ( Dr Melvia Heaps aware). WBC is not elevated. Hgb stable. INR therapeutic.   I reviewed the LVAD parameters from today, and compared the results to the patient's prior recorded data.  No programming changes were made.  The LVAD is functioning within specified parameters.  The patient performs LVAD self-test daily.  LVAD interrogation was negative for any significant power changes, alarms or PI events/speed drops.  LVAD equipment check completed and is in good working order.  Back-up equipment present.   LVAD education done on emergency procedures and precautions and reviewed exit site care.  Length of Stay: 0  Darrick Grinder, NP 09/12/2017, 11:32 AM  VAD Team Pager [phone] (7am - 7am) +++VAD ISSUES ONLY+++   Advanced Heart Failure Team Pager [phone] (M-F; [place])  Please contact [facility] for night-coverage after hours (4p -7a ) and weekends on [email or web address] for all non- LVAD Issues

## 2017-09-12 NOTE — Addendum Note (Signed)
Encounter addended by: Fredirick Maudlin, RN on: 09/12/2017 11:59 AM  Actions taken: LDA properties accepted, Flowsheet accepted, Charge Capture section accepted

## 2017-09-12 NOTE — Addendum Note (Signed)
Encounter addended by: Larey Dresser, MD on: 09/12/2017 12:30 PM  Actions taken: Charge Capture section accepted, Sign clinical note

## 2017-09-12 NOTE — Addendum Note (Signed)
Encounter addended by: Adora Fridge, RPH-CPP on: 09/12/2017 12:14 PM  Actions taken: Order list changed, Diagnosis association updated

## 2017-09-12 NOTE — Progress Notes (Addendum)
Cardiology: Dr. Aundra Dubin  HPI: 52 yo with history of CAD s/p anterior MI in 2010 and NSTEMI in 3/18 with DES to pRCA and mLAD and ischemic cardiomyopathy now s/p Heartmate 3 LVAD and ESRD presents for followup of CHF/LVAD.  Patient developed low output heart failure.  He was not deemed to be a good transplant candidate.  He was admitted in 10/18 and Heartmate 3 LVAD was placed.  He had baseline CKD stage 3 likely from diabetic nephropathy and CHF.  He developed peri-op AKI and ended up on dialysis.  No renal recovery to date.  St Jude ICD has been turned back on and ramp echo was done prior to discharge in 10/18, speed increased to 5600 rpm.    He was admitted in 11/18 with nausea/vomiting and abdominal pain.  This appears to have been due to diabetic gastroparesis (extensive GI evaluation). He had atrial flutter this admission that was converted to NSR with amiodarone.  He improved considerably with scheduled Reglan and erythromycin. He was re-admitted later in 11/18 with the same symptoms, suspect diabetic gastroparesis and was again treated with erythromycin.  He was not able to start domperidone due to prolonged QT interval. He had nausea again about a week ago but was able to manage at home with Phenergan suppositories.   Today he presented for an acute visit due to ongoing nausea/vomiting. Over the last 48 hours he has had increased N/V. Unable to eat or take medications since Sunday. Feels terrible. Weight at home has gone down 10 pounds from 183>173 pounds. Denies SOB. No N/V. He has been taking phenergan suppositories. He had HD yesterday but felt terrible. No BRBPR. Denies LVAD alarms.  Denies driveline trauma, erythema or drainage.  Denies ICD shocks.    Reports taking Coumadin as prescribed and adherence to anticoagulation based dietary restrictions.  Denies bright red blood per rectum or melena, no dark urine or hematuria.    Labs (11/18): hgb 10.4 => 8.7, LDH 213 Labs (08/04/17): hgb  9  PMH: 1. Chronic systolic CHF: Ischemic cardiomyopathy. St Jude ICD.  - Echo (11/17) with EF 25-30%, moderate diastolic dysfunction, mild MR, mildly dilated RV with moderately decreased systolic function, peak RV-RA gradient 48 mmHg.  - Echo (3/18) with EF 25-30%, wall motion abnormalities, normal RV size and systolic function.  - Admission 3/18 with cardiogenic shock and AKI, required milrinone gtt.  - Echo (8/18) with EF 20-25%, mild LVH, moderate LV dilation, mild MR.  - CPX (8/18) with peak VO2 12.4, VE/VCO2 slope 35, RER 1.05 => severe HR limitation.  - Heartmate 3 LVAD 10/18.  2. CAD: Anterior MI 2005, had bifurcation stenting LAD/diagonal. Repeat cath 2010 with patent stents.  - NSTEMI 3/18: LHC with 99% ulcerated lesion proximal RCA, 95% mid LAD => PCI with DES to proximal RCA and mid LAD.  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  - Lower extremity venous dopplers (8/18): chronic DVT - V/Q scan (8/18): Normal, no PE.  4. Crohns disease: History of colectomy.  5. GERD 6. Diabetic gastroparesis: Admission 11/18 with abdominal pain, nausea/vomiting.  7. HTN 8. Hyperlipidemia 9. Type II diabetes  10. ESRD: Post-op LVAD surgery.  11. Carotid dopplers (8/18): mild bilateral stenosis.  12. PFTs (8/18): Mild restriction (no IDL on CT chest).  13. Lung nodules: CT chest 8/18 with small bilateral nodules.  14. PAD: ABIs with moderate bilateral disease in 8/18.  15. Atrial flutter: In 11/18 associated with nausea/vomiting from gastroparesis.   Current Outpatient Medications  Medication Sig  Dispense Refill  . amLODipine (NORVASC) 10 MG tablet Take 1 tablet (10 mg total) by mouth daily. Take 1 tablet (5m) on Tuesday, Thursday, Saturday and Sunday (non HD days) by mouth 90 tablet 6  . aspirin EC 81 MG tablet Take 1 tablet (81 mg total) by mouth daily. 90 tablet 3  . atorvastatin (LIPITOR) 40 MG tablet Take 1 tablet (40 mg total) daily at 6 PM by mouth. 30 tablet 6  . Continuous Blood Gluc  Sensor (FREESTYLE LIBRE SENSOR SYSTEM) MISC Apply 1 reader to the skin every 10 days. 3 each 11  . docusate sodium (COLACE) 100 MG capsule Take 2 capsules (200 mg total) by mouth daily. 10 capsule 0  . glucose blood (FREESTYLE PRECISION NEO TEST) test strip Use as instructed 100 each 12  . hydrALAZINE (APRESOLINE) 50 MG tablet Take 1 tablet (50 mg total) by mouth 3 (three) times daily. 90 tablet 6  . insulin aspart (NOVOLOG) 100 UNIT/ML injection Inject 3 Units into the skin 3 (three) times daily with meals. (Patient taking differently: Inject 3-5 Units into the skin 3 (three) times daily with meals. ) 1 vial 0  . insulin glargine (LANTUS) 100 UNIT/ML injection Inject 0.05 mLs (5 Units total) into the skin daily. 10 mL 11  . LORazepam (ATIVAN) 0.5 MG tablet Take 1 tablet (0.5 mg total) by mouth every 12 (twelve) hours as needed for anxiety or sleep. 30 tablet 0  . metoCLOPramide (REGLAN) 5 MG tablet Take 1 tablet (5 mg total) by mouth 4 (four) times daily -  before meals and at bedtime. 130 tablet 6  . ondansetron (ZOFRAN ODT) 4 MG disintegrating tablet Take 1 tablet (4 mg total) by mouth every 6 (six) hours as needed for nausea or vomiting. 40 tablet 1  . pantoprazole (PROTONIX) 40 MG tablet Take 40 mg by mouth daily.    . promethazine (PHENERGAN) 25 MG suppository Place 25 mg rectally every 6 (six) hours as needed for nausea or vomiting.    . promethazine (PHENERGAN) 25 MG tablet Take 1 tablet (25 mg total) by mouth every 6 (six) hours as needed for nausea or vomiting. 30 tablet 0  . sildenafil (REVATIO) 20 MG tablet Take 1 tablet (20 mg total) by mouth 3 (three) times daily. 90 tablet 6  . warfarin (COUMADIN) 5 MG tablet Take 5 mg by mouth daily.     No current facility-administered medications for this encounter.     Metformin and related    REVIEW OF SYSTEMS: All systems negative except as listed in HPI, PMH and Problem list.   LVAD INTERROGATION:.  Speed 5600 PF 3.7 Power 4.3 PI 7.5  Rare PI events.   I reviewed the LVAD parameters from today, and compared the results to the patient's prior recorded data.  No programming changes were made.  The LVAD is functioning within specified parameters.  The patient performs LVAD self-test daily.  LVAD interrogation was negative for any significant power changes, alarms or PI events/speed drops.  LVAD equipment check completed and is in good working order.  Back-up equipment present.   LVAD education done on emergency procedures and precautions and reviewed exit site care.    Vitals:   09/12/17 1038  BP: (!) 140/92  Pulse: 79  Resp: 16  SpO2: 98%  Weight: 173 lb 3.2 oz (78.6 kg)  Height: 5' 8"  (1.727 m)     Physical Exam: GENERAL: Appears fatigued. N/V in the clinic.  HEENT: normal  NECK: Supple, JVP  5-6  .  2+ bilaterally, no bruits.  No lymphadenopathy or thyromegaly appreciated.   CARDIAC:  Mechanical heart sounds with LVAD hum present.  LUNGS:  Clear to auscultation bilaterally.  ABDOMEN:  Soft, round, nontender, positive bowel sounds x4.     LVAD exit site: Dressing changed by VAD coordinator today. Dressing dry and intact.  No erythema or drainage.  Stabilization device present and accurately applied.  Driveline dressing is being changed daily per sterile technique. EXTREMITIES:  Warm and dry, no cyanosis, clubbing, rash or edema  NEUROLOGIC:  Alert and oriented x 4.  Gait steady.  No aphasia.  No dysarthria.  Affect pleasant.     ASSESSMENT AND PLAN: 1. Chronic systolic CHF: Ischemic cardiomyopathy.  St Jude ICD.  NSTEMI in March 2018 with DES to LAD and RCA, complicated by cardiogenic shock, low output requiring milrinone.  Echo 8/18 with EF 20-25%, moderate MR.  PX (8/18) with severe functional impairment due to HF.  He is now s/p HM3 LVAD placement. LVAD parameters reviewed today and stable.  No complaints. MAP remains in 90s.  Hemoglobin stable.  - NYHA II. Volume status ok.  Unable to take Po med. MAP elelvated.  Will add IV hydralazine until he can take po medications.  - Continue ASA 81.  Continue warfarin INR 2-3 for now with no overt GI bleeding.  - He is on sildenafil 20 mg tid for RV failure => will hold if requires NTG gtt for BP control.  2. ESRD: Developed peri-operatively after HM3 placement.  He is tolerating HD without problems currently. Consult Nephrology while hospitalized.  3. CAD: NSTEMI in 3/18. LHC with 99% ulcerated lesion proximal RCA with left to right collaterals, 95% mid LAD stenosis after mid LAD stent. s/p PCI to RCA and LAD on 11/25/16.  No exertional chest pain.  - Continue statin and ASA. 4. h/o DVTs: Factor V Leiden heterozygote.  On warfarin.  5. Diabetic gastroparesis:  Uncontrolled. Continue Phenergan suppositories if nausea/vomiting returns.  Continue  reglan 5 mg tid/ac 6. DM: Per internal medicine.  7. HTN: MAP mildly elevated.  Add IV hydralazine until he can take pos.   8. PAD: Moderate bilateral disease on ABIs in 8/18.  Denies claudication currently, no pedal ulcers.  9. Atrial flutter: Get EKG.  10. Weight loss- in the setting N/V. Watch closely.   Place IV and give 10 mg IV hydralazine now. MAP 140.  Admit to stepdown due to uncontrolled N/V weight loss.  Labs pending.   Amy Clegg NP-C  09/12/2017   Patient seen with NP, agree with the above note.  Patient with recurrent nausea/vomiting, unable to keep down po x 2 days.  This is very similar to prior episodes of gastroparesis.  Has taken Phenergan pr but this did not control symptoms.  Unable to take BP meds, MAP up to 140.  INR 2.22, LDH 273, hgb 11.6.  Weight is down.    We are going to admit him for symptom control and BP control.  Will start IV Phenergan and Ativan.  Continue Reglan but change to IV.  Will restart erythromycin 250 mg tid (hopefully can take this po after he gets anti-nausea meds). Will need to revisit use of domperidone, get ECG for QTc.   MAP very high, will start IV hydralazine.  Will  use NTG gtt if needs further control.   Jaymere Alen 09/12/2017 12:29 PM

## 2017-09-13 ENCOUNTER — Encounter (HOSPITAL_COMMUNITY): Payer: Self-pay

## 2017-09-13 ENCOUNTER — Other Ambulatory Visit: Payer: Self-pay

## 2017-09-13 DIAGNOSIS — K3184 Gastroparesis: Secondary | ICD-10-CM

## 2017-09-13 DIAGNOSIS — R112 Nausea with vomiting, unspecified: Secondary | ICD-10-CM

## 2017-09-13 DIAGNOSIS — E1143 Type 2 diabetes mellitus with diabetic autonomic (poly)neuropathy: Principal | ICD-10-CM

## 2017-09-13 LAB — CBC
HEMATOCRIT: 34.3 % — AB (ref 39.0–52.0)
HEMOGLOBIN: 10.5 g/dL — AB (ref 13.0–17.0)
MCH: 25.5 pg — ABNORMAL LOW (ref 26.0–34.0)
MCHC: 30.6 g/dL (ref 30.0–36.0)
MCV: 83.5 fL (ref 78.0–100.0)
Platelets: 243 10*3/uL (ref 150–400)
RBC: 4.11 MIL/uL — ABNORMAL LOW (ref 4.22–5.81)
RDW: 22.1 % — ABNORMAL HIGH (ref 11.5–15.5)
WBC: 10.7 10*3/uL — ABNORMAL HIGH (ref 4.0–10.5)

## 2017-09-13 LAB — GLUCOSE, CAPILLARY
Glucose-Capillary: 183 mg/dL — ABNORMAL HIGH (ref 65–99)
Glucose-Capillary: 194 mg/dL — ABNORMAL HIGH (ref 65–99)
Glucose-Capillary: 121 mg/dL — ABNORMAL HIGH (ref 65–99)

## 2017-09-13 LAB — BASIC METABOLIC PANEL
Anion gap: 12 (ref 5–15)
BUN: 33 mg/dL — ABNORMAL HIGH (ref 6–20)
CHLORIDE: 98 mmol/L — AB (ref 101–111)
CO2: 24 mmol/L (ref 22–32)
Calcium: 8.7 mg/dL — ABNORMAL LOW (ref 8.9–10.3)
Creatinine, Ser: 6.59 mg/dL — ABNORMAL HIGH (ref 0.61–1.24)
GFR calc non Af Amer: 9 mL/min — ABNORMAL LOW (ref >60)
GFR, EST AFRICAN AMERICAN: 10 mL/min — AB (ref >60)
Glucose, Bld: 218 mg/dL — ABNORMAL HIGH (ref 65–99)
Potassium: 4 mmol/L (ref 3.5–5.1)
Sodium: 134 mmol/L — ABNORMAL LOW (ref 135–145)

## 2017-09-13 LAB — MRSA PCR SCREENING: MRSA BY PCR: NEGATIVE

## 2017-09-13 LAB — PROTIME-INR
INR: 2.57
Prothrombin Time: 27.4 seconds — ABNORMAL HIGH (ref 11.4–15.2)

## 2017-09-13 LAB — HEMOGLOBIN A1C
Hgb A1c MFr Bld: 6.1 % — ABNORMAL HIGH (ref 4.8–5.6)
MEAN PLASMA GLUCOSE: 128.37 mg/dL

## 2017-09-13 LAB — HEPATITIS B SURFACE ANTIGEN: Hepatitis B Surface Ag: NEGATIVE

## 2017-09-13 LAB — LACTATE DEHYDROGENASE: LDH: 249 U/L — ABNORMAL HIGH (ref 98–192)

## 2017-09-13 MED ORDER — ALTEPLASE 2 MG IJ SOLR: 2.0000 mg | Freq: Once | INTRAMUSCULAR | Status: DC | PRN

## 2017-09-13 MED ORDER — LIDOCAINE HCL (PF) 1 % IJ SOLN: 5.0000 mL | INTRAMUSCULAR | Status: DC | PRN

## 2017-09-13 MED ORDER — LIDOCAINE-PRILOCAINE 2.5-2.5 % EX CREA: 1.0000 "application " | TOPICAL_CREAM | CUTANEOUS | Status: DC | PRN

## 2017-09-13 MED ORDER — SODIUM CHLORIDE 0.9 % IV SOLN: 100.0000 mL | INTRAVENOUS | Status: DC | PRN

## 2017-09-13 MED ORDER — HYDRALAZINE HCL 20 MG/ML IJ SOLN
10.0000 mg | INTRAMUSCULAR | Status: DC
  Administered 2017-09-13 – 2017-09-15 (×7): 10 mg via INTRAVENOUS
  Filled 2017-09-13 (×7): qty 1

## 2017-09-13 MED ORDER — WARFARIN SODIUM 5 MG PO TABS
5.0000 mg | ORAL_TABLET | Freq: Once | ORAL | Status: AC
  Administered 2017-09-13: 5 mg via ORAL
  Filled 2017-09-13: qty 1

## 2017-09-13 MED ORDER — HEPARIN SODIUM (PORCINE) 1000 UNIT/ML DIALYSIS: 1000.0000 [IU] | INTRAMUSCULAR | Status: DC | PRN

## 2017-09-13 MED ORDER — PENTAFLUOROPROP-TETRAFLUOROETH EX AERO: 1.0000 "application " | INHALATION_SPRAY | CUTANEOUS | Status: DC | PRN

## 2017-09-13 MED ORDER — HEPARIN SODIUM (PORCINE) 1000 UNIT/ML DIALYSIS: 20.0000 [IU]/kg | INTRAMUSCULAR | Status: DC | PRN

## 2017-09-13 NOTE — Progress Notes (Signed)
ANTICOAGULATION CONSULT NOTE - Follow Up Consult  Pharmacy Consult for Coumadin Indication: LVAD, hx DVT and factor V Leiden  Allergies  Allergen Reactions  . Metformin And Related Diarrhea    Patient Measurements: Height: 5' 8"  (172.7 cm) Weight: 174 lb 9.7 oz (79.2 kg) IBW/kg (Calculated) : 68.4  Vital Signs: Temp: 98.1 F (36.7 C) (01/09 1145) Temp Source: Oral (01/09 1145) BP: 96/62 (01/09 1145) Pulse Rate: 102 (01/09 1145)  Labs: Recent Labs    09/12/17 1034 09/13/17 0334  HGB 11.6* 10.5*  HCT 37.8* 34.3*  PLT 243 243  LABPROT 24.4* 27.4*  INR 2.22 2.57  CREATININE 5.25* 6.59*    Estimated Creatinine Clearance: 12.7 mL/min (A) (by C-G formula based on SCr of 6.59 mg/dL (H)).  Assessment:  61 yom admitted 09/12/17 with ongoing nausea and vomiting.  Continues on Coumadin as prior to admission.   INR is therapeutic (2.57); trended up from 2.22 after he received usual dose of 5 mg daily on 1/8.   On erythromycin, which sometimes effects INR. On clear liquids. Has taken several PO meds today.  Goal of Therapy:  INR 2-3 Monitor platelets by anticoagulation protocol: Yes   Plan:   Coumadin 5 mg today, usual dose.  Continue daily PT/INR.  Will follow PO intake and INR trend.  Arty Baumgartner,  Pager: [phone] 09/13/2017,12:47 PM

## 2017-09-13 NOTE — Consult Note (Signed)
[facility] Renal Consultation Note    Indication for Consultation:  Management of ESRD/hemodialysis, anemia, hypertension/volume, and secondary hyperparathyroidism. PCP:  HPI: [name] is a 52 y.o. male with ESRD, ischemic cardiomyopathy with LVAD, CAD, Type 2 DM, GERD who was admitted with gastroparesis (N/V).  Pt has had intermittent issues with gastroparesis in the past. This episode was ongoing x 2 days prior to admission. Unable to keep meds or food down, and losing weight. He was admitted directly by his heart failure team for evaluation/treatment.  Today, he continues to have N/V despite multiple meds. GI consulted; recommended ongoing med therapy with erythomycin, reglan, phenergan, and lorazepam.  From renal standpoint, he dialyzes MWF at [facility] via [facility]. Does reasonably well with HD. We were consulted for HD, he is due for HD today.  Past Medical History:  Diagnosis Date  . Angina   . ASCVD (arteriosclerotic cardiovascular disease)    , Anterior infarction 2005, LAD diagonal bifurcation intervention 03/2004  . Automatic implantable cardiac defibrillator -St. Jude's       . Benign neoplasm of colon   . CHF (congestive heart failure) ([place])   . Chronic systolic heart failure ([place])   . Coronary artery disease     Widely patent previously placed stents in the left anterior   . Crohn's disease ([place])   . 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 Dialysis patient ([place])   . Dyspnea   . Gastroparesis   . GERD (gastroesophageal reflux disease)   . High cholesterol   . Hyperlipidemia   . Hypersomnolent    Previous diagnosis of narcolepsy  . Hypertension, essential   . Ischemic cardiomyopathy    Ejection fraction 15-20% catheterization 2010  . Type II or unspecified type diabetes mellitus without mention of complication, not stated as uncontrolled   . Unspecified gastritis and gastroduodenitis without mention of hemorrhage    Past Surgical  History:  Procedure Laterality Date  . APPENDECTOMY    . AV FISTULA PLACEMENT Right 06/26/2017   Procedure: ARTERIOVENOUS (AV) FISTULA CREATION VERSUS GRAFT RIGHT  ARM;  Surgeon: Conrad Concorde Hills, MD;  Location: [place];  Service: Vascular;  Laterality: Right;  . CARDIAC DEFIBRILLATOR PLACEMENT  2010   St. Jude ICD  . COLECTOMY  ~ 2003   "for Crohn's" ascending colon.    . CORONARY STENT INTERVENTION N/A 11/25/2016   Procedure: Coronary Stent Intervention;  Surgeon: Leonie Man, MD;  Location: [place] CV LAB;  Service: Cardiovascular;  Laterality: N/A;  . EP IMPLANTABLE DEVICE N/A 09/14/2016   Procedure: ICD Generator Changeout;  Surgeon: Deboraha Sprang, MD;  Location: [place] CV LAB;  Service: Cardiovascular;  Laterality: N/A;  . ESOPHAGOGASTRODUODENOSCOPY N/A 07/12/2017   Procedure: ESOPHAGOGASTRODUODENOSCOPY (EGD);  Surgeon: Jerene Bears, MD;  Location: [facility] ENDOSCOPY;  Service: Gastroenterology;  Laterality: N/A;  VAD pt so VAD team will need to accompany pt.   [name] FETAL SURGERY FOR CONGENITAL HERNIA     ????? pt has no knowledge of this..    . INGUINAL HERNIA REPAIR    . INSERTION OF DIALYSIS CATHETER Right 06/26/2017   Procedure: INSERTION OF TUNNELED  DIALYSIS CATHETER RIGHT INTERNAL JUGULAR;  Surgeon: Conrad De Smet, MD;  Location: [place];  Service: Vascular;  Laterality: Right;  . INSERTION OF IMPLANTABLE LEFT VENTRICULAR ASSIST DEVICE N/A 06/12/2017   Procedure: INSERTION OF IMPLANTABLE LEFT VENTRICULAR ASSIST [place] 3;  Surgeon: Ivin Poot, MD;  Location: [place];  Service:  Open Heart Surgery;  Laterality: N/A;  HM3 LVAD  CIRC ARREST  NITRIC OXIDE  . IR THORACENTESIS ASP PLEURAL SPACE W/IMG GUIDE  06/28/2017  . MULTIPLE EXTRACTIONS WITH ALVEOLOPLASTY N/A 06/08/2017   Procedure: MULTIPLE EXTRACTION WITH ALVEOLOPLASTY AND PRE PROSTHETIC SURGERY AS NEEDED;  Surgeon: Lenn Cal, DDS;  Location: [place];  Service: Oral Surgery;  Laterality: N/A;  . RIGHT HEART CATH  N/A 06/07/2017   Procedure: RIGHT HEART CATH;  Surgeon: Larey Dresser, MD;  Location: [place] CV LAB;  Service: Cardiovascular;  Laterality: N/A;  . RIGHT/LEFT HEART CATH AND CORONARY ANGIOGRAPHY N/A 11/23/2016   Procedure: Right/Left Heart Cath and Coronary Angiography;  Surgeon: Larey Dresser, MD;  Location: [place] CV LAB;  Service: Cardiovascular;  Laterality: N/A;  . TEE WITHOUT CARDIOVERSION N/A 06/12/2017   Procedure: TRANSESOPHAGEAL ECHOCARDIOGRAM (TEE);  Surgeon: Prescott Gum, Collier Salina, MD;  Location: [place];  Service: Open Heart Surgery;  Laterality: N/A;   Family History  Problem Relation Age of Onset  . Colon polyps Mother   . Diabetes Mother   . Heart attack Father   . Diabetes Father   . Stroke Paternal Grandfather   . Colitis Sister   . Heart disease Brother   . Colitis Sister   . Colon cancer Neg Hx    Social History:  reports that  has never smoked. he has never used smokeless tobacco. He reports that he does not drink alcohol or use drugs.  Review of Systems: + chills, N/V. No fever, dyspnea.  Physical Exam: Vitals:   09/13/17 0328 09/13/17 0334 09/13/17 0819 09/13/17 1145  BP: 98/79  (!) 88/48 96/62  Pulse: 95  (!) 103 (!) 102  Resp: (!) 28  (!) 22 17  Temp: 97.7 F (36.5 C)  98.3 F (36.8 C) 98.1 F (36.7 C)  TempSrc: Oral  Oral Oral  SpO2: 92%  98% 98%  Weight:  79.2 kg (174 lb 9.7 oz)    Height:         General: Thin male, NAD but looks ill today. Head: Normocephalic, atraumatic, sclera non-icteric, mucus membranes are moist. Neck: Supple without lymphadenopathy/masses. JVD not elevated. Lungs: Clear bilaterally to auscultation without wheezes, rales, or rhonchi. Breathing is unlabored. Heart: RRR; LVAD in place thru port in LU abdomen. Abdomen: Soft, non-tender, non-distended. Musculoskeletal:  Strength and tone appear normal for age. Lower extremities: No edema or ischemic changes, no open wounds. Neuro: Alert and oriented X 3. Moves all  extremities spontaneously. Psych:  Responds to questions appropriately with a normal affect. Dialysis Access: TDC in R chest, maturing RUE AVF  Allergies  Allergen Reactions  . Metformin And Related Diarrhea   Prior to Admission medications   Medication Sig Start Date End Date Taking? Authorizing Provider  amLODipine (NORVASC) 10 MG tablet Take 1 tablet (10 mg total) by mouth daily. Take 1 tablet (68m) on Tuesday, Thursday, Saturday and Sunday (non HD days) by mouth 08/22/17  Yes MLarey Dresser MD  aspirin EC 81 MG tablet Take 1 tablet (81 mg total) by mouth daily. 07/26/17  Yes MLarey Dresser MD  atorvastatin (LIPITOR) 40 MG tablet Take 1 tablet (40 mg total) daily at 6 PM by mouth. 07/16/17  Yes Bhagat, Bhavinkumar, PA  docusate sodium (COLACE) 100 MG capsule Take 2 capsules (200 mg total) by mouth daily. 07/03/17  Yes Clegg, Amy D, NP  hydrALAZINE (APRESOLINE) 50 MG tablet Take 1 tablet (50 mg total) by mouth 3 (three) times daily.  08/08/17  Yes Larey Dresser, MD  insulin aspart (NOVOLOG) 100 UNIT/ML injection Inject 3 Units into the skin 3 (three) times daily with meals. Patient taking differently: Inject 3-5 Units into the skin 3 (three) times daily with meals.  07/03/17  Yes Clegg, Amy D, NP  insulin glargine (LANTUS) 100 UNIT/ML injection Inject 0.05 mLs (5 Units total) into the skin daily. 07/03/17  Yes Clegg, Amy D, NP  LORazepam (ATIVAN) 0.5 MG tablet Take 1 tablet (0.5 mg total) by mouth every 12 (twelve) hours as needed for anxiety or sleep. 08/04/17  Yes Clegg, Amy D, NP  metoCLOPramide (REGLAN) 5 MG tablet Take 1 tablet (5 mg total) by mouth 4 (four) times daily -  before meals and at bedtime. 08/04/17  Yes Clegg, Amy D, NP  ondansetron (ZOFRAN ODT) 4 MG disintegrating tablet Take 1 tablet (4 mg total) by mouth every 6 (six) hours as needed for nausea or vomiting. 01/10/17  Yes Esterwood, Amy S, PA-C  pantoprazole (PROTONIX) 40 MG tablet Take 40 mg by mouth daily.   Yes  [provider]  promethazine (PHENERGAN) 25 MG suppository Place 25 mg rectally every 6 (six) hours as needed for nausea or vomiting.   Yes [provider]  promethazine (PHENERGAN) 25 MG tablet Take 1 tablet (25 mg total) by mouth every 6 (six) hours as needed for nausea or vomiting. 08/08/17  Yes Larey Dresser, MD  warfarin (COUMADIN) 5 MG tablet Take 5 mg by mouth daily.   Yes [provider]  Continuous Blood Gluc Sensor ([place]) MISC Apply 1 reader to the skin every 10 days. 08/17/17   Zenia Resides, MD  glucose blood (FREESTYLE PRECISION NEO TEST) test strip Use as instructed 08/17/17   Zenia Resides, MD  sildenafil (REVATIO) 20 MG tablet Take 1 tablet (20 mg total) by mouth 3 (three) times daily. 07/26/17   Larey Dresser, MD   Current Facility-Administered Medications  Medication Dose Route Frequency Provider Last Rate Last Dose  . 0.9 %  sodium chloride infusion   Intra-arterial PRN Clegg, Amy D, NP      . acetaminophen (TYLENOL) tablet 650 mg  650 mg Oral Q4H PRN Clegg, Amy D, NP      . amLODipine (NORVASC) tablet 10 mg  10 mg Oral Daily Clegg, Amy D, NP   10 mg at 09/13/17 1036  . aspirin EC tablet 81 mg  81 mg Oral Daily Clegg, Amy D, NP   81 mg at 09/13/17 1037  . atorvastatin (LIPITOR) tablet 40 mg  40 mg Oral q1800 Clegg, Amy D, NP      . docusate sodium (COLACE) capsule 200 mg  200 mg Oral Daily Clegg, Amy D, NP   200 mg at 09/13/17 1037  . erythromycin (E-MYCIN) tablet 250 mg  250 mg Oral TID WC Clegg, Amy D, NP   250 mg at 09/13/17 0900  . hydrALAZINE (APRESOLINE) injection 10 mg  10 mg Intravenous Q4H Larey Dresser, MD      . insulin aspart (novoLOG) injection 0-9 Units  0-9 Units Subcutaneous TID WC Clegg, Amy D, NP   2 Units at 09/13/17 0858  . LORazepam (ATIVAN) injection 1 mg  1 mg Intravenous Q4H PRN Clegg, Amy D, NP   1 mg at 09/13/17 0112  . LORazepam (ATIVAN) tablet 0.5 mg  0.5 mg Oral Q12H PRN Clegg, Amy  D, NP      . metoCLOPramide (REGLAN) injection 5  mg  5 mg Intravenous Q6H Vena Rua, PA-C   5 mg at 09/13/17 0551  . nitroGLYCERIN 50 mg in dextrose 5 % 250 mL (0.2 mg/mL) infusion  0-200 mcg/min Intravenous Titrated Clegg, Amy D, NP 22.5 mL/hr at 09/13/17 0851 75 mcg/min at 09/13/17 0851  . ondansetron (ZOFRAN) injection 4 mg  4 mg Intravenous Q6H PRN Clegg, Amy D, NP   4 mg at 09/13/17 1026  . ondansetron (ZOFRAN-ODT) disintegrating tablet 4 mg  4 mg Oral Q6H PRN Clegg, Amy D, NP      . pantoprazole (PROTONIX) injection 40 mg  40 mg Intravenous Q24H Vena Rua, PA-C   40 mg at 09/12/17 1505  . promethazine (PHENERGAN) injection 12.5 mg  12.5 mg Intravenous Q6H PRN Clegg, Amy D, NP      . promethazine (PHENERGAN) injection 12.5 mg  12.5 mg Intravenous Q6H Vena Rua, PA-C   12.5 mg at 09/13/17 0809  . Warfarin - Pharmacist Dosing Inpatient   Does not apply q1800 Larey Dresser, MD       Labs: Basic Metabolic Panel: Recent Labs  Lab 09/12/17 1034 09/13/17 0334  NA 135 134*  K 3.7 4.0  CL 98* 98*  CO2 23 24  GLUCOSE 167* 218*  BUN 24* 33*  CREATININE 5.25* 6.59*  CALCIUM 9.0 8.7*   CBC: Recent Labs  Lab 09/12/17 1034 09/13/17 0334  WBC 7.9 10.7*  HGB 11.6* 10.5*  HCT 37.8* 34.3*  MCV 84.6 83.5  PLT 243 243   CBG: Recent Labs  Lab 09/12/17 1744 09/12/17 2102 09/13/17 0812 09/13/17 1149  GLUCAP 265* 216* 183* 194*   Dialysis Orders:  MWF at [facility] 4:15hr, 400/800, 3K/2Ca, EDW 80kg, TDC, heparin 8000 bolus - Venofer 74m IV weekly - Mircera 1536m IV q 2 weeks (last 1/2) - Calcitriol 0.44m24mPO q HD  Assessment/Plan: 1.  Gastroparesis (recurrent): Continue meds per primary team per GI recs. 2.  ESRD: Will continue HD per MWF schedule. For HD today. Low UF goal. 3.  Hypertension/volume: BP low with vomiting, below EDW. Follow. 4.  Anemia: Hgb 10.5, no ESA yet. 5.  Metabolic bone disease: Ca ok, Phos pending. Monitor for now, not really tolerating  PO meds, so BMM meds on hold for now. 6. Ischemic CM w/ LVAD: Per cardiology 7. Type 2 DM: Per primary. On insulin.  KatVeneta PentonA-C 09/13/2017, 12:23 PM  [facility] Pager: [phone] seen, examined and agree w A/P as above. May need some IVF's.  RobKelly Splinter [employer] [phone]/05/2018, 1:00 PM

## 2017-09-13 NOTE — Progress Notes (Signed)
Advanced Heart Failure VAD Team Note  Subjective:    Admitted with uncontrolled N/V. GI/Nephrology consulted.   A line placed and NTG drip started to control Maps. Remains on NTG 75 mcg.   Complaining of nausea.     LVAD INTERROGATION:  HeartMate II LVAD:  Flow 4.4 liters/min, speed 5600, power 4, PI 2.8   Objective:    Vital Signs:   Temp:  [97.7 F (36.5 C)-98.8 F (37.1 C)] 98.3 F (36.8 C) (01/09 0819) Pulse Rate:  [79-103] 103 (01/09 0819) Resp:  [0-28] 22 (01/09 0819) BP: (81-141)/(48-100) 88/48 (01/09 0819) SpO2:  [92 %-99 %] 98 % (01/09 0819) Arterial Line BP: (110-154)/(76-96) 135/86 (01/09 0119) Weight:  [174 lb 9.7 oz (79.2 kg)-175 lb 4.3 oz (79.5 kg)] 174 lb 9.7 oz (79.2 kg) (01/09 0334)   Mean arterial Pressure 90-100  Intake/Output:  No intake or output data in the 24 hours ending 09/13/17 0930   Physical Exam    General:  Appears fatigued. No resp difficulty HEENT: normal Neck: supple. JVP ~10  Carotids 2+ bilat; no bruits. No lymphadenopathy or thyromegaly appreciated. Cor: Mechanical heart sounds with LVAD hum present. R Upper chest HD catheter.  Lungs: clear Abdomen: soft, nontender, nondistended. No hepatosplenomegaly. No bruits or masses. Good bowel sounds. Driveline: C/D/I; securement device intact and driveline incorporated Extremities: no cyanosis, clubbing, rash, edema. LUE A line. Neuro: alert & orientedx3, cranial nerves grossly intact. moves all 4 extremities w/o difficulty. Affect pleasant   Telemetry   NSR-ST 90-100s   EKG   Pending.  Labs   Basic Metabolic Panel: Recent Labs  Lab 09/12/17 1034 09/13/17 0334  NA 135 134*  K 3.7 4.0  CL 98* 98*  CO2 23 24  GLUCOSE 167* 218*  BUN 24* 33*  CREATININE 5.25* 6.59*  CALCIUM 9.0 8.7*    Liver Function Tests: No results for input(s): AST, ALT, ALKPHOS, BILITOT, PROT, ALBUMIN in the last 168 hours. No results for input(s): LIPASE, AMYLASE in the last 168 hours. No results  for input(s): AMMONIA in the last 168 hours.  CBC: Recent Labs  Lab 09/12/17 1034 09/13/17 0334  WBC 7.9 10.7*  HGB 11.6* 10.5*  HCT 37.8* 34.3*  MCV 84.6 83.5  PLT 243 243    INR: Recent Labs  Lab 09/07/17 09/12/17 1034 09/13/17 0334  INR 2.0 2.22 2.57    Other results:  EKG:    Imaging    No results found.   Medications:     Scheduled Medications: . amLODipine  10 mg Oral Daily  . aspirin EC  81 mg Oral Daily  . atorvastatin  40 mg Oral q1800  . docusate sodium  200 mg Oral Daily  . erythromycin  250 mg Oral TID WC  . hydrALAZINE  50 mg Oral TID  . insulin aspart  0-9 Units Subcutaneous TID WC  . metoCLOPramide (REGLAN) injection  5 mg Intravenous Q6H  . pantoprazole (PROTONIX) IV  40 mg Intravenous Q24H  . promethazine  12.5 mg Intravenous Q6H  . Warfarin - Pharmacist Dosing Inpatient   Does not apply q1800     Infusions: . sodium chloride    . nitroGLYCERIN 75 mcg/min (09/13/17 0851)     PRN Medications:  Place/Maintain arterial line **AND** sodium chloride, acetaminophen, hydrALAZINE, LORazepam, LORazepam, ondansetron (ZOFRAN) IV, ondansetron, promethazine   Patient Profile  52 yo with history of CAD s/p anterior MI in 2010 and NSTEMI in 3/18 with DES to pRCA and mLAD and ischemic cardiomyopathy  s/p Heartmate 3 LVAD, diabetes gastroparesis,  and ESRD.   Admitted with uncontrolled nausea and vomiting.       Assessment/Plan:    1. Uncontrolled Vomiting, diabetic gastroparesis.  Continue phenergan suppositories. Continue reglan  5 mg tid AC.  Last admit he was placed on erythromycin to promote motility while hospitalized and 7 days post discharge.  Continue  erythromycin 250 mg tid.  GI consult appreciated. No recommendations other than refer to gastroparesis specialist at [facility], Dr Corliss Parish.  Hgb A1C 6.1    2. Chronic systolic CHF: Ischemic cardiomyopathy. St Jude ICD. NSTEMI in March 2018 with DES to LAD and RCA, complicated by  cardiogenic shock, low output requiring milrinone. Echo 8/18 with EF 20-25%, moderate MR. PX (8/18) with severe functional impairment due to HF.  He is now s/p HM3 LVAD placement. LVAD parameters reviewed today and stable.  On NTG drip at 75 mcg. Still unable to much by mouth.    - Continue ASA 81.  - INR 2.5 Continue warfarin INR 2-3 for now with no overt GI bleeding. Pharmacy following.  - Off sildenafil with NTG drip.   2. ESRD: Developed peri-operatively after HM3 placement. He is tolerating HD without problems currently. Had HD 09/11/2017  Consulted Nephrology.  3. CAD: NSTEMI in 3/18. LHC with 99% ulcerated lesion proximal RCA with left to right collaterals, 95% mid LAD stenosis after mid LAD stent. s/p PCI to RCA and LAD on 11/25/16.  - No S/S ischemia.  - Continue statin and ASA. 4. h/o DVTs: Factor V Leiden heterozygote. On warfarin.  5. DM: Start sliding scale. Hgb A1C  6.1 6. HTN: ON 75 mcg NTG drip.  7. PAD: Moderate bilateral disease on ABIs in 8/18. Denies claudication currently, no pedal ulcers.  8. Atrial flutter: 9. Weight loss- in the setting N/V. Watch closely.   Hopefully he can take pos soon and we can wean NTG drip.   We will contact Dr Corliss Parish at [facility] regarding his gastroparesis. I would like to get appointment prior to discharge.   I reviewed the LVAD parameters from today, and compared the results to the patient's prior recorded data.  No programming changes were made.  The LVAD is functioning within specified parameters.  The patient performs LVAD self-test daily.  LVAD interrogation was negative for any significant power changes, alarms or PI events/speed drops.  LVAD equipment check completed and is in good working order.  Back-up equipment present.   LVAD education done on emergency procedures and precautions and reviewed exit site care.  Length of Stay: 1  [name] Clegg, NP 09/13/2017, 9:30 AM  VAD Team --- VAD ISSUES ONLY--- Pager [phone] (7am -  7am)  Patient seen with NP, agree with the above note.  He is still nauseated and retches with any attempt at po intake (for meds).  Seen by GI yesterday, no new recommendations other than referral to gastroparesis specialist at [facility], which we will undertake after discharge.  His LVAD parameters are stable, LDH and hgb stable.    MAP better on NTG gtt.  Will add hydralazine IV until he can take po.   Continue IV phenergan and Ativan for nausea for now, would like him to try to get erythromycin down as this will help with gastroparesis.   He will need HD today, nephrology to see.   [name] 09/13/2017 10:46 AM  Advanced Heart Failure Team  Pager [phone] (M-F; 7a - 4p)  Please contact [facility] for night-coverage after hours (4p -  7a ) and weekends on [email or web address]

## 2017-09-13 NOTE — Procedures (Signed)
   I was present at this dialysis session, have reviewed the session itself and made  appropriate changes Kelly Splinter MD [place] pager [phone]   09/13/2017, 4:49 PM

## 2017-09-13 NOTE — Progress Notes (Signed)
LVAD Coordinator Rounding Note:  Admitted 09/12/17 due to ongoing nausea and vomiting.  HM III LVAD implanted on 06/12/17 by Dr. Darcey Nora under Destination Therapy (DT) criteria due to renal insufficiency.  Vital signs: Temp: 98.3 HR: 104 Doppler Pressure: 100 Arterial line:  88/48 (59) O2 Sat:  96% on RA Wt:  175>174 lbs   LVAD interrogation reveals:   Speed:  5600 Flow: 4.2 Power:  4.2 PI: 4.0 Alarms: none  Events:  4 PI events on 09/11/17 Hematocrit:  34 Fixed speed: 5600 Low speed limit: 5300  Drive Line:  Abdominal LLQ Sorbaview dressing dry and intact; driveline secured in anchor, but anchor needs to be replaced. Anchor replaced; driveline secured. Pt says dressing is due to be changed Friday 09/15/17; VAD coordinator will change.   Labs:  LDH trend: 273>249  INR trend: 2.22>2.57  Anticoagulation Plan: -INR Goal:  2.0 - 3.0 -ASA Dose: 81 mg daily   Device: St Jude dual Therapies: on at 200 BPM Monitor: on slow VT 150 BPM  Renal: Pt has OP dialysis on MWF afternoons - no adverse events reported with LVAD during dialysis   Adverse Events on VAD: - ESRD post LVAD; started CVVH 06/15/17; HD started 06/20/17 - Hospitalization 11/5 - 07/16/17 > gastroparesis diagnosis after EGD - Hospitalization 11/26 - 08/04/17 > gastroparesis   Plan/Recommendations:   1. VAD coordinator will change VAD dressing Friday 09/14/17 2. Please call VAD Pager for any equipment concerns or drive line dressing questions/concerns.  Zada Girt RN, VAD Coordinator 24/7 VAD Pager: [phone]

## 2017-09-13 NOTE — Progress Notes (Addendum)
Inpatient Diabetes Program Recommendations  AACE/ADA: New Consensus Statement on Inpatient Glycemic Control (2015)  Target Ranges:  Prepandial:   less than 140 mg/dL      Peak postprandial:   less than 180 mg/dL (1-2 hours)      Critically ill patients:  140 - 180 mg/dL   Lab Results  Component Value Date   GLUCAP 183 (H) 09/13/2017   HGBA1C 6.1 (H) 09/13/2017    Review of Glycemic ControlResults for DELLA, HOMAN (MRN [id number]) as of 09/13/2017 10:37  Ref. Range 09/12/2017 17:44 09/12/2017 21:02 09/13/2017 08:12  Glucose-Capillary Latest Ref Range: 65 - 99 mg/dL 265 (H) 216 (H) 183 (H)   Diabetes history: Type 2 DM Outpatient Diabetes medications:  Novolog 3-5 units tid with meals, Lantus 5 units daily Current orders for Inpatient glycemic control:  Novolog sensitive tid with meals Inpatient Diabetes Program Recommendations:   May consider adding Lantus 5 units daily while patient is in the hospital. Note consult and chart reviewed. A1C indicates good control of CBG's prior to admit. Patient also has CGM sensor outpatient for monitoring.  Will follow.   Thanks,  Adah Perl, RN, BC-ADM Inpatient Diabetes Coordinator Pager [phone] (8a-5p)

## 2017-09-13 NOTE — Progress Notes (Signed)
Patient transported to HD on battery power, sent with two back-up charged batteries. RN escorted patient to bay.

## 2017-09-14 ENCOUNTER — Ambulatory Visit: Payer: Self-pay | Admitting: Pharmacist

## 2017-09-14 LAB — BASIC METABOLIC PANEL
Anion gap: 12 (ref 5–15)
BUN: 10 mg/dL (ref 6–20)
CALCIUM: 8.7 mg/dL — AB (ref 8.9–10.3)
CO2: 27 mmol/L (ref 22–32)
CREATININE: 4.22 mg/dL — AB (ref 0.61–1.24)
Chloride: 96 mmol/L — ABNORMAL LOW (ref 101–111)
GFR calc Af Amer: 17 mL/min — ABNORMAL LOW (ref >60)
GFR, EST NON AFRICAN AMERICAN: 15 mL/min — AB (ref >60)
GLUCOSE: 125 mg/dL — AB (ref 65–99)
Potassium: 3.7 mmol/L (ref 3.5–5.1)
Sodium: 135 mmol/L (ref 135–145)

## 2017-09-14 LAB — CBC
HCT: 35.4 % — ABNORMAL LOW (ref 39.0–52.0)
HEMOGLOBIN: 11 g/dL — AB (ref 13.0–17.0)
MCH: 26.5 pg (ref 26.0–34.0)
MCHC: 31.1 g/dL (ref 30.0–36.0)
MCV: 85.3 fL (ref 78.0–100.0)
Platelets: 257 10*3/uL (ref 150–400)
RBC: 4.15 MIL/uL — ABNORMAL LOW (ref 4.22–5.81)
RDW: 22.7 % — ABNORMAL HIGH (ref 11.5–15.5)
WBC: 11.2 10*3/uL — ABNORMAL HIGH (ref 4.0–10.5)

## 2017-09-14 LAB — GLUCOSE, CAPILLARY
Glucose-Capillary: 144 mg/dL — ABNORMAL HIGH (ref 65–99)
Glucose-Capillary: 160 mg/dL — ABNORMAL HIGH (ref 65–99)
Glucose-Capillary: 134 mg/dL — ABNORMAL HIGH (ref 65–99)
Glucose-Capillary: 109 mg/dL — ABNORMAL HIGH (ref 65–99)

## 2017-09-14 LAB — LACTATE DEHYDROGENASE: LDH: 240 U/L — ABNORMAL HIGH (ref 98–192)

## 2017-09-14 LAB — PROTIME-INR
INR: 3.07
PROTHROMBIN TIME: 31.5 s — AB (ref 11.4–15.2)

## 2017-09-14 MED ORDER — PRO-STAT SUGAR FREE PO LIQD
30.0000 mL | Freq: Three times a day (TID) | ORAL | Status: DC
  Administered 2017-09-14 – 2017-09-16 (×4): 30 mL via ORAL
  Filled 2017-09-14 (×5): qty 30

## 2017-09-14 MED ORDER — HYDRALAZINE HCL 50 MG PO TABS
50.0000 mg | ORAL_TABLET | Freq: Three times a day (TID) | ORAL | Status: DC
  Administered 2017-09-14 – 2017-09-15 (×4): 50 mg via ORAL
  Filled 2017-09-14 (×7): qty 1

## 2017-09-14 MED ORDER — PROMETHAZINE HCL 25 MG PO TABS
12.5000 mg | ORAL_TABLET | Freq: Four times a day (QID) | ORAL | Status: DC | PRN
  Administered 2017-09-14 – 2017-09-15 (×2): 12.5 mg via ORAL
  Filled 2017-09-14 (×2): qty 1

## 2017-09-14 MED ORDER — WARFARIN SODIUM 2.5 MG PO TABS
2.5000 mg | ORAL_TABLET | Freq: Once | ORAL | Status: AC
  Administered 2017-09-14: 2.5 mg via ORAL
  Filled 2017-09-14: qty 1

## 2017-09-14 NOTE — Progress Notes (Signed)
  [facility] Progress Note   Subjective:   Seen in room, resting quietly. S/p HD yesterday, 528m UF only. Objective Vitals:   09/14/17 0400 09/14/17 0500 09/14/17 0732 09/14/17 0736  BP: 106/61  104/81 104/81  Pulse:    (!) 112  Resp:      Temp:    98.5 F (36.9 C)  TempSrc:    Oral  SpO2:    97%  Weight:  74.3 kg (163 lb 12.8 oz)    Height:       Physical Exam General: Resting quietly Heart: RRR Lungs: CTA Extremities: No LE edema [facility]  Additional Objective Labs: Basic Metabolic Panel: Recent Labs  Lab 09/12/17 1034 09/13/17 0334 09/14/17 0343  NA 135 134* 135  K 3.7 4.0 3.7  CL 98* 98* 96*  CO2 23 24 27   GLUCOSE 167* 218* 125*  BUN 24* 33* 10  CREATININE 5.25* 6.59* 4.22*  CALCIUM 9.0 8.7* 8.7*   CBC: Recent Labs  Lab 09/12/17 1034 09/13/17 0334 09/14/17 0343  WBC 7.9 10.7* 11.2*  HGB 11.6* 10.5* 11.0*  HCT 37.8* 34.3* 35.4*  MCV 84.6 83.5 85.3  PLT 243 243 257   Medications: . sodium chloride    . nitroGLYCERIN 75 mcg/min (09/13/17 1955)   . amLODipine  10 mg Oral Daily  . aspirin EC  81 mg Oral Daily  . atorvastatin  40 mg Oral q1800  . docusate sodium  200 mg Oral Daily  . erythromycin  250 mg Oral TID WC  . hydrALAZINE  10 mg Intravenous Q4H  . insulin aspart  0-9 Units Subcutaneous TID WC  . metoCLOPramide (REGLAN) injection  5 mg Intravenous Q6H  . pantoprazole (PROTONIX) IV  40 mg Intravenous Q24H  . promethazine  12.5 mg Intravenous Q6H  . Warfarin - Pharmacist Dosing Inpatient   Does not apply q1800    [facility]800, 3K/2Ca, EDW 80kg, TDC, heparin 8000 bolus - Venofer 524mIV weekly - Mircera 15071mIV q 2 weeks (last 1/2) - Calcitriol 0.5mc31mO q HD  Assessment/Plan: 1.  Gastroparesis (recurrent): Continue meds per primary team per GI recs. 2.  ESRD: Will continue HD per MWF schedule. Next 1/11. 3.  Hypertension/volume: BP low with vomiting, below  EDW. Follow. 4.  Anemia: Hgb 10.5, no ESA yet. 5.  Metabolic bone disease: Ca ok, Phos pending. Monitor for now, not really tolerating PO meds, so BMM meds on hold for now. 6. Ischemic CM w/ LVAD: Per cardiology. On nitro drip. 7. Type 2 DM: Per primary. On insulin.   KatiVeneta Penton-C 09/14/2017, 9:55 AM  [facility] Pager: [phone] seen, examined and agree w A/P as above.  Rob Kelly SplinterCaroNewell Rubbermaider [phone] 2031/06/2018, 11:52 AM

## 2017-09-14 NOTE — Progress Notes (Signed)
LVAD Coordinator Rounding Note:  Admitted 09/12/17 due to ongoing nausea and vomiting.  HM III LVAD implanted on 06/12/17 by Dr. Darcey Nora under Destination Therapy (DT) criteria due to renal insufficiency.  Vital signs: Temp: 98.5 HR: 114 Doppler Pressure:  92 Auto cuff: 104/81 (88) O2 Sat:  97% on RA Wt:  175>165>163 lbs   LVAD interrogation reveals:   Speed:  5600 Flow: 4.6 Power:  4.3w PI: 3.1 Alarms: none  Events:  none Hematocrit:  36 Fixed speed: 5600 Low speed limit: 5300  Drive Line:  Abdominal LLQ Sorbaview dressing dry and intact; driveline secured in anchor. Weekly dressing is due to be changed Friday 09/15/17; VAD coordinator will change.   Labs:  LDH trend: 273>249>240  INR trend: 2.22>2.57>3.0  Anticoagulation Plan: -INR Goal:  2.0 - 3.0 -ASA Dose: 81 mg daily   Device: St Jude dual Therapies: on at 200 BPM Monitor: on slow VT 150 BPM  Renal: Pt has OP dialysis on MWF afternoons - no adverse events reported with LVAD during dialysis.  Adverse Events on VAD: - ESRD post LVAD; started CVVH 06/15/17; HD started 06/20/17 - Hospitalization 11/5 - 07/16/17 > gastroparesis diagnosis after EGD - Hospitalization 11/26 - 08/04/17 > gastroparesis   Plan/Recommendations:   1. VAD coordinator will change VAD dressing Friday 09/14/17 2. Please call VAD Pager for any equipment concerns or drive line dressing questions/concerns.  Zada Girt RN, VAD Coordinator 24/7 VAD Pager: [phone]

## 2017-09-14 NOTE — Progress Notes (Signed)
A-line removed by patient. Minimal blood loss. Patient stable. Cardiology notified. Will continue to monitor pressure via cuff and doppler.

## 2017-09-14 NOTE — Progress Notes (Signed)
ANTICOAGULATION CONSULT NOTE - Follow Up Consult  Pharmacy Consult for Coumadin Indication: LVAD, hx DVT and factor V Leiden  Allergies  Allergen Reactions  . Metformin And Related Diarrhea    Patient Measurements: Height: 5' 8"  (172.7 cm) Weight: 163 lb 12.8 oz (74.3 kg) IBW/kg (Calculated) : 68.4  Vital Signs: Temp: 98.7 F (37.1 C) (01/10 1131) Temp Source: Axillary (01/10 1131) BP: 111/91 (01/10 1132) Pulse Rate: 110 (01/10 1132)  Labs: Recent Labs    09/12/17 1034 09/13/17 0334 09/14/17 0343  HGB 11.6* 10.5* 11.0*  HCT 37.8* 34.3* 35.4*  PLT 243 243 257  LABPROT 24.4* 27.4* 31.5*  INR 2.22 2.57 3.07  CREATININE 5.25* 6.59* 4.22*    Estimated Creatinine Clearance: 19.8 mL/min (A) (by C-G formula based on SCr of 4.22 mg/dL (H)).  Assessment: 44 yom s/p LVAD continues on coumadin. INR therapeutic 3.07 but at the upper end of goal today. Patient with limited PO intake so suspect INR will continue to trend up - will decrease dose tonight. Continues on erythromycin which can sometimes elevate INR.  CBC/LDH stable. No bleeding reported.  Home dose: 45m daily  Goal of Therapy:  INR 2-3 Monitor platelets by anticoagulation protocol: Yes   Plan:  1) Coumadin 2.546mtonight 2) Daily INR  MaDeboraha Sprang/06/2018,1:37 PM

## 2017-09-14 NOTE — Progress Notes (Signed)
Initial Nutrition Assessment  DOCUMENTATION CODES:   Not applicable  INTERVENTION:   -30 ml Prostat TID, each supplement provides 100 kcals and 15 grams protein  NUTRITION DIAGNOSIS:   Inadequate oral intake related to nausea, vomiting, poor appetite as evidenced by meal completion < 25%, per patient/family report.  GOAL:   Patient will meet greater than or equal to 90% of their needs  MONITOR:   PO intake, Supplement acceptance, Labs, Weight trends, Skin, I & O's  REASON FOR ASSESSMENT:   Consult Poor PO  ASSESSMENT:   52 yo with history of CAD s/p anterior MI in 2010 and NSTEMI in 3/18 with DES to pRCA and mLAD and ischemic cardiomyopathy  s/p Heartmate 3 LVAD, diabetes gastroparesis,  and ESRD.   Pt admitted with uncontrolled nausea and vomiting and gastroparesis.   Spoke with pt at bedside, who kept his eyes closed at time of visit, but answered simple questions for this RD. He reports he typically consumes 3 meals per day (which are made by his children, usually chicken with a starch and vegetable). Pt endorses poor appetite over the past 5 days secondary to nausea and vomiting. He is able to tolerate minimal PO intake secondary to nausea. Pt with clear liquid tray at bedside, which was unattempted. Documented meal completion 0%.   Pt estimates that he has lost about 10# over the past 5 days secondary to above symptoms. He denies any weight loss PTA. He was unable to determine his UBW, however, denies any changes in dry wt at HD. Noted dry wt of 163# at last HD. Noted pt has experienced a 16% wt loss over the past 6 months, whic is significant for time frame, however, wt difficult to interpret secondary to fluid changes from HD. Per chart review, UBW around 185-190#.   Pt shares that he takes Glucerna supplements at home. Pt declined offer of supplement ("not right now, please"). Given pt's gastroparesis, nausea, and limited PO intake, will try Prostat due to low volume.    Medications reviwed and include reglan, and phenergan.   Last HGb A1C: 6.1 (09/13/17), which is indicative of good control. PTA DM medications are 3-5 units insulin aspart TID with meals and  5 units insulin glargine daily.   Labs reviewed: CBGS: 121-194 (inpatient order for glycemic control are 0-9 units insulin aspart  TID with meals)  NUTRITION - FOCUSED PHYSICAL EXAM:    Most Recent Value  Orbital Region  No depletion  Upper Arm Region  No depletion  Thoracic and Lumbar Region  No depletion  Buccal Region  No depletion  Temple Region  Mild depletion  Clavicle and Acromion Bone Region  No depletion  Scapular Bone Region  No depletion  Dorsal Hand  No depletion  Patellar Region  Mild depletion  Anterior Thigh Region  Mild depletion  Posterior Calf Region  Mild depletion  Edema (RD Assessment)  None  Hair  Reviewed  Eyes  Reviewed  Mouth  Reviewed  Skin  Reviewed  Nails  Reviewed       Diet Order:  Diet regular Room service appropriate? Yes; Fluid consistency: Thin  EDUCATION NEEDS:   Not appropriate for education at this time  Skin:  Skin Assessment: Reviewed RN Assessment  Last BM:  09/12/17  Height:   Ht Readings from Last 1 Encounters:  09/12/17 5' 8"  (1.727 m)    Weight:   Wt Readings from Last 1 Encounters:  09/14/17 163 lb 12.8 oz (74.3 kg)  Ideal Body Weight:  70 kg  BMI:  Body mass index is 24.91 kg/m.  Estimated Nutritional Needs:   Kcal:  2200-2400  Protein:  110-125 grams  Fluid:  per MD    Shi Grose A. Jimmye Norman, RD, LDN, CDE Pager: [phone] After hours Pager: [phone]

## 2017-09-14 NOTE — Plan of Care (Signed)
[place]

## 2017-09-14 NOTE — Progress Notes (Signed)
Advanced Heart Failure VAD Team Note  Subjective:    Admitted with uncontrolled N/V. GI/Nephrology consulted.   Remains on NTG 75 mcg. Taking a little a little bit more by mouth.   Denies nausea. Taking ativan twice a day + 12.5 mg phenergan every 6 hours.     LVAD INTERROGATION:  HeartMate II LVAD:  Flow 4,5 liters/min, speed 5600, power 4, PI 2.9.    Objective:    Vital Signs:   Temp:  [97.9 F (36.6 C)-98.8 F (37.1 C)] 98.5 F (36.9 C) (01/10 0736) Pulse Rate:  [102-112] 112 (01/10 0736) Resp:  [14-20] 15 (01/10 0359) BP: (73-168)/(35-113) 104/81 (01/10 0736) SpO2:  [95 %-99 %] 97 % (01/10 0736) Arterial Line BP: (98-114)/(73-85) 114/85 (01/09 2354) Weight:  [163 lb 12.8 oz (74.3 kg)-165 lb 9.1 oz (75.1 kg)] 163 lb 12.8 oz (74.3 kg) (01/10 0500) Last BM Date: 09/12/17 Mean arterial Pressure 90-100  Intake/Output:   Intake/Output Summary (Last 24 hours) at 09/14/2017 1012 Last data filed at 09/13/2017 1900 Gross per 24 hour  Intake 485.15 ml  Output 500 ml  Net -14.85 ml     Physical Exam   Physical Exam: GENERAL: In bed. NAD. Appears drowsy.  HEENT: normal  NECK: Supple, JVP  .  2+ bilaterally, no bruits.  No lymphadenopathy or thyromegaly appreciated.   CARDIAC:  Mechanical heart sounds with LVAD hum present.  RIJ HD cath  LUNGS:  Clear to auscultation bilaterally.  ABDOMEN:  Soft, round, nontender, positive bowel sounds x4.     LVAD exit site: well-healed and incorporated.  Dressing dry and intact.  No erythema or drainage.  Stabilization device present and accurately applied.  Driveline dressing is being changed daily per sterile technique. EXTREMITIES:  Warm and dry, no cyanosis, clubbing, rash or edema  NEUROLOGIC:  Alert and oriented x 4.  Gait steady.  No aphasia.  No dysarthria.  Affect flat      Telemetry   Sinus Tach 100s.   EKG   Pending.  Labs   Basic Metabolic Panel: Recent Labs  Lab 09/12/17 1034 09/13/17 0334 09/14/17 0343  NA 135  134* 135  K 3.7 4.0 3.7  CL 98* 98* 96*  CO2 23 24 27   GLUCOSE 167* 218* 125*  BUN 24* 33* 10  CREATININE 5.25* 6.59* 4.22*  CALCIUM 9.0 8.7* 8.7*    Liver Function Tests: No results for input(s): AST, ALT, ALKPHOS, BILITOT, PROT, ALBUMIN in the last 168 hours. No results for input(s): LIPASE, AMYLASE in the last 168 hours. No results for input(s): AMMONIA in the last 168 hours.  CBC: Recent Labs  Lab 09/12/17 1034 09/13/17 0334 09/14/17 0343  WBC 7.9 10.7* 11.2*  HGB 11.6* 10.5* 11.0*  HCT 37.8* 34.3* 35.4*  MCV 84.6 83.5 85.3  PLT 243 243 257    INR: Recent Labs  Lab 09/12/17 1034 09/13/17 0334 09/14/17 0343  INR 2.22 2.57 3.07    Other results:  EKG:    Imaging   No results found.   Medications:     Scheduled Medications: . amLODipine  10 mg Oral Daily  . aspirin EC  81 mg Oral Daily  . atorvastatin  40 mg Oral q1800  . docusate sodium  200 mg Oral Daily  . erythromycin  250 mg Oral TID WC  . hydrALAZINE  10 mg Intravenous Q4H  . insulin aspart  0-9 Units Subcutaneous TID WC  . metoCLOPramide (REGLAN) injection  5 mg Intravenous Q6H  . pantoprazole (PROTONIX)  IV  40 mg Intravenous Q24H  . promethazine  12.5 mg Intravenous Q6H  . Warfarin - Pharmacist Dosing Inpatient   Does not apply q1800    Infusions: . sodium chloride    . nitroGLYCERIN 75 mcg/min (09/13/17 1955)    PRN Medications: Place/Maintain arterial line **AND** sodium chloride, acetaminophen, LORazepam, LORazepam, ondansetron (ZOFRAN) IV, ondansetron, promethazine   Patient Profile  52 yo with history of CAD s/p anterior MI in 2010 and NSTEMI in 3/18 with DES to pRCA and mLAD and ischemic cardiomyopathy  s/p Heartmate 3 LVAD, diabetes gastroparesis,  and ESRD.   Admitted with uncontrolled nausea and vomiting.     Assessment/Plan:    1. Uncontrolled Vomiting, diabetic gastroparesis.  Continue phenergan suppositories. Continue reglan  5 mg tid AC.  Last admit he was  placed on erythromycin to promote motility while hospitalized and 7 days post discharge.  Continue  erythromycin 250 mg tid.  GI consult appreciated. No recommendations other than refer to gastroparesis specialist at [facility], Dr Corliss Parish.  Hgb A1C 6.1    2. Chronic systolic CHF: Ischemic cardiomyopathy. St Jude ICD. NSTEMI in March 2018 with DES to LAD and RCA, complicated by cardiogenic shock, low output requiring milrinone. Echo 8/18 with EF 20-25%, moderate MR. PX (8/18) with severe functional impairment due to HF.  He is now s/p HM3 LVAD placement. LVAD parameters reviewed today and stable.  On NTG drip at 75 mcg. Restart oral hydralazine. Able to take amlodipine this morning.     - Continue ASA 81.  - INR 3.07  Continue warfarin INR 2-3 for now with no overt GI bleeding. Pharmacy following.  - Off sildenafil with NTG drip.   2. ESRD: Developed peri-operatively after HM3 placement. He is tolerating HD without problems currently. Had HD 1/9  Consulted Nephrology.  3. CAD: NSTEMI in 3/18. LHC with 99% ulcerated lesion proximal RCA with left to right collaterals, 95% mid LAD stenosis after mid LAD stent. s/p PCI to RCA and LAD on 11/25/16.  - No S/S ischemia.  - Continue statin and ASA. 4. h/o DVTs: Factor V Leiden heterozygote. On warfarin.  5. DM: Start sliding scale. Hgb A1C  6.1 6. HTN: ON 75 mcg NTG drip. Start to wean today.  Able to take norvasc this morning. Restart hydralazine 50 mg three times a day.  7. PAD: Moderate bilateral disease on ABIs in 8/18. Denies claudication currently, no pedal ulcers.  8. Atrial flutter:In Sinus Tach  9. Weight loss- in the setting N/V. Watch closely. Consult dietitian. ? Appetite stimulant.   We will contact Dr Derrill Kay at [facility] regarding his gastroparesis. I would like to get appointment prior to discharge.   I reviewed the LVAD parameters from today, and compared the results to the patient's prior recorded data.  No programming changes  were made.  The LVAD is functioning within specified parameters.  The patient performs LVAD self-test daily.  LVAD interrogation was negative for any significant power changes, alarms or PI events/speed drops.  LVAD equipment check completed and is in good working order.  Back-up equipment present.   LVAD education done on emergency procedures and precautions and reviewed exit site care.  Length of Stay: 2  Darrick Grinder, NP 09/14/2017, 10:12 AM  VAD Team --- VAD ISSUES ONLY--- Pager [phone] (7am - 7am)  Patient seen with NP, agree with the above note.  Still with nausea but starting to feel better with ongoing symptomatic treatment.  Getting in his po erythromycin.  MAP around  90 today, still on NTG gtt but was able to take his am pills.  LVAD parameters stable, labs stable.  Not volume overloaded on exam.   Continue IV phenergan, IV Ativan, po erythromycin.  Mobilization should help as well.   Restart po meds. Can start back on sildenafil when he is off NTG gtt.   As above, suspect severe diabetic gastroparesis.  Working to get appt with specialist at [place] (Dr. Derrill Kay).   Peace Jost 09/14/2017 11:32 AM

## 2017-09-15 ENCOUNTER — Telehealth: Payer: Self-pay | Admitting: Gastroenterology

## 2017-09-15 LAB — PROTIME-INR
INR: 4.05
Prothrombin Time: 39 seconds — ABNORMAL HIGH (ref 11.4–15.2)

## 2017-09-15 LAB — BASIC METABOLIC PANEL
Anion gap: 16 — ABNORMAL HIGH (ref 5–15)
BUN: 27 mg/dL — AB (ref 6–20)
CHLORIDE: 92 mmol/L — AB (ref 101–111)
CO2: 25 mmol/L (ref 22–32)
Calcium: 8.6 mg/dL — ABNORMAL LOW (ref 8.9–10.3)
Creatinine, Ser: 6.69 mg/dL — ABNORMAL HIGH (ref 0.61–1.24)
GFR calc Af Amer: 10 mL/min — ABNORMAL LOW (ref >60)
GFR calc non Af Amer: 9 mL/min — ABNORMAL LOW (ref >60)
Glucose, Bld: 162 mg/dL — ABNORMAL HIGH (ref 65–99)
POTASSIUM: 3.5 mmol/L (ref 3.5–5.1)
SODIUM: 133 mmol/L — AB (ref 135–145)

## 2017-09-15 LAB — GLUCOSE, CAPILLARY
GLUCOSE-CAPILLARY: 103 mg/dL — AB (ref 65–99)
GLUCOSE-CAPILLARY: 174 mg/dL — AB (ref 65–99)
Glucose-Capillary: 199 mg/dL — ABNORMAL HIGH (ref 65–99)

## 2017-09-15 LAB — CBC
HCT: 39.1 % (ref 39.0–52.0)
HEMOGLOBIN: 11.9 g/dL — AB (ref 13.0–17.0)
MCH: 25.9 pg — AB (ref 26.0–34.0)
MCHC: 30.4 g/dL (ref 30.0–36.0)
MCV: 85.2 fL (ref 78.0–100.0)
Platelets: 280 10*3/uL (ref 150–400)
RBC: 4.59 MIL/uL (ref 4.22–5.81)
RDW: 22.9 % — ABNORMAL HIGH (ref 11.5–15.5)
WBC: 9.8 10*3/uL (ref 4.0–10.5)

## 2017-09-15 LAB — LACTATE DEHYDROGENASE: LDH: 252 U/L — ABNORMAL HIGH (ref 98–192)

## 2017-09-15 MED ORDER — NEPRO/CARBSTEADY PO LIQD
237.0000 mL | Freq: Three times a day (TID) | ORAL | Status: DC
  Administered 2017-09-15 – 2017-09-16 (×3): 237 mL via ORAL
  Filled 2017-09-15 (×8): qty 237

## 2017-09-15 MED ORDER — SILDENAFIL CITRATE 20 MG PO TABS
20.0000 mg | ORAL_TABLET | Freq: Three times a day (TID) | ORAL | Status: DC
  Administered 2017-09-15 – 2017-09-16 (×4): 20 mg via ORAL
  Filled 2017-09-15 (×6): qty 1

## 2017-09-15 MED ORDER — DRONABINOL 2.5 MG PO CAPS: 2.5000 mg | ORAL_CAPSULE | Freq: Two times a day (BID) | ORAL | Status: DC

## 2017-09-15 MED ORDER — AMLODIPINE BESYLATE 10 MG PO TABS
10.0000 mg | ORAL_TABLET | Freq: Every day | ORAL | Status: DC
  Administered 2017-09-15 – 2017-09-16 (×2): 10 mg via ORAL
  Filled 2017-09-15 (×2): qty 1

## 2017-09-15 MED ORDER — HYDRALAZINE HCL 20 MG/ML IJ SOLN: 10.0000 mg | INTRAMUSCULAR | Status: DC | PRN

## 2017-09-15 MED ORDER — DRONABINOL 2.5 MG PO CAPS
2.5000 mg | ORAL_CAPSULE | Freq: Two times a day (BID) | ORAL | Status: DC
  Administered 2017-09-15 – 2017-09-16 (×3): 2.5 mg via ORAL
  Filled 2017-09-15 (×3): qty 1

## 2017-09-15 NOTE — Plan of Care (Signed)
Continue current care plan 

## 2017-09-15 NOTE — Progress Notes (Signed)
LVAD Coordinator Rounding Note:  Admitted 09/12/17 due to ongoing nausea and vomiting.  HM III LVAD implanted on 06/12/17 by Dr. Darcey Nora under Destination Therapy (DT) criteria due to renal insufficiency.  Vital signs: Temp: 98.5 HR: 94 Doppler Pressure:   Auto cuff:  O2 Sat:  96% on RA Wt:  175>165>163>163 lbs   LVAD interrogation reveals:   Speed:  5600 Flow: 4.5 Power:  4.2w PI: 3.1 Alarms: none  Events:  none Hematocrit:  36 Fixed speed: 5600 Low speed limit: 5300  Drive Line:  Abdominal LLQ Sorbaview dressing dry and intact; driveline secured in anchor. Weekly dressing is due todday; VAD coordinator will change.   Labs:  LDH trend: 273>249>240>252  INR trend: 2.22>2.57>3.0>4.0  Anticoagulation Plan: -INR Goal:  2.0 - 3.0 -ASA Dose: 81 mg daily   Device: St Jude dual Therapies: on at 200 BPM Monitor: on slow VT 150 BPM  Renal: Pt has OP dialysis on MWF afternoons - no adverse events reported with LVAD during dialysis.  Adverse Events on VAD: - ESRD post LVAD; started CVVH 06/15/17; HD started 06/20/17 - Hospitalization 11/5 - 07/16/17 > gastroparesis diagnosis after EGD - Hospitalization 11/26 - 08/04/17 > gastroparesis   Plan/Recommendations:   1. VAD coordinator will change VAD dressing today 2. Please call VAD Pager for any equipment concerns or drive line dressing questions/concerns.  Zada Girt RN, VAD Coordinator 24/7 VAD Pager: [phone]

## 2017-09-15 NOTE — Progress Notes (Signed)
Advanced Heart Failure VAD Team Note  Subjective:    Admitted with uncontrolled N/V. GI/Nephrology consulted.   Yesterday NTG drip stopped. Able to take po medications. Minimal input.   Denies SOB. Says nausea is a little better.   LVAD INTERROGATION:  HeartMate II LVAD:  Flow 4.3  liters/min, speed 5600, power 4.2 , PI 4.2    Objective:    Vital Signs:   Temp:  [98.2 F (36.8 C)-100.6 F (38.1 C)] 98.5 F (36.9 C) (01/11 0800) Pulse Rate:  [81-116] 91 (01/11 1000) Resp:  [16-22] 18 (01/11 1000) BP: (79-121)/(33-91) 102/77 (01/11 1000) SpO2:  [95 %-100 %] 96 % (01/11 0800) Weight:  [163 lb 12.8 oz (74.3 kg)] 163 lb 12.8 oz (74.3 kg) (01/11 0448) Last BM Date: 09/12/17 Mean arterial Pressure 90s   Intake/Output:   Intake/Output Summary (Last 24 hours) at 09/15/2017 1111 Last data filed at 09/14/2017 1800 Gross per 24 hour  Intake 495.45 ml  Output -  Net 495.45 ml     Physical Exam   Physical Exam: GENERAL: Well appearing, male who presents to clinic today in no acute distress. HEENT: normal  NECK: Supple, JVP 6-7.  2+ bilaterally, no bruits.  No lymphadenopathy or thyromegaly appreciated.   CARDIAC:  Mechanical heart sounds with LVAD hum present.  LUNGS:  Clear to auscultation bilaterally.  ABDOMEN:  Soft, round, nontender, positive bowel sounds x4.     LVAD exit site:  Dressing dry and intact.  No erythema or drainage.  Stabilization device present and accurately applied.  Driveline dressing is being changed daily per sterile technique. EXTREMITIES:  Warm and dry, no cyanosis, clubbing, rash or edema  NEUROLOGIC:  Alert and oriented x 4.  Gait steady.  No aphasia.  No dysarthria.  Affect pleasant.       Telemetry   Sinus Tach 100s.   EKG   Pending.  Labs   Basic Metabolic Panel: Recent Labs  Lab 09/12/17 1034 09/13/17 0334 09/14/17 0343 09/15/17 0212  NA 135 134* 135 133*  K 3.7 4.0 3.7 3.5  CL 98* 98* 96* 92*  CO2 23 24 27 25   GLUCOSE 167*  218* 125* 162*  BUN 24* 33* 10 27*  CREATININE 5.25* 6.59* 4.22* 6.69*  CALCIUM 9.0 8.7* 8.7* 8.6*    Liver Function Tests: No results for input(s): AST, ALT, ALKPHOS, BILITOT, PROT, ALBUMIN in the last 168 hours. No results for input(s): LIPASE, AMYLASE in the last 168 hours. No results for input(s): AMMONIA in the last 168 hours.  CBC: Recent Labs  Lab 09/12/17 1034 09/13/17 0334 09/14/17 0343 09/15/17 0212  WBC 7.9 10.7* 11.2* 9.8  HGB 11.6* 10.5* 11.0* 11.9*  HCT 37.8* 34.3* 35.4* 39.1  MCV 84.6 83.5 85.3 85.2  PLT 243 243 257 280    INR: Recent Labs  Lab 09/12/17 1034 09/13/17 0334 09/14/17 0343 09/15/17 0212  INR 2.22 2.57 3.07 4.05*    Other results:  EKG:    Imaging   No results found.   Medications:     Scheduled Medications: . aspirin EC  81 mg Oral Daily  . atorvastatin  40 mg Oral q1800  . docusate sodium  200 mg Oral Daily  . erythromycin  250 mg Oral TID WC  . feeding supplement (NEPRO CARB STEADY)  237 mL Oral TID WC  . feeding supplement (PRO-STAT SUGAR FREE 64)  30 mL Oral TID BM  . hydrALAZINE  10 mg Intravenous Q4H  . hydrALAZINE  50 mg Oral  TID  . insulin aspart  0-9 Units Subcutaneous TID WC  . metoCLOPramide (REGLAN) injection  5 mg Intravenous Q6H  . pantoprazole (PROTONIX) IV  40 mg Intravenous Q24H  . Warfarin - Pharmacist Dosing Inpatient   Does not apply q1800    Infusions: . sodium chloride    . nitroGLYCERIN Stopped (09/14/17 1800)    PRN Medications: Place/Maintain arterial line **AND** sodium chloride, acetaminophen, LORazepam, LORazepam, ondansetron (ZOFRAN) IV, ondansetron, promethazine   Patient Profile  52 yo with history of CAD s/p anterior MI in 2010 and NSTEMI in 3/18 with DES to pRCA and mLAD and ischemic cardiomyopathy  s/p Heartmate 3 LVAD, diabetes gastroparesis,  and ESRD.   Admitted with uncontrolled nausea and vomiting.     Assessment/Plan:    1. Uncontrolled Vomiting, diabetic  gastroparesis.  Seems a little better today.  Continue phenergan suppositories. Continue reglan  5 mg tid AC.  Last admit he was placed on erythromycin to promote motility while hospitalized and 7 days post discharge.  Continue  erythromycin 250 mg tid.  GI consult appreciated. No recommendations other than refer to gastroparesis specialist at [facility], Dr [name].  Hgb A1C 6.1    2. Chronic systolic CHF: Ischemic cardiomyopathy. St Jude ICD. NSTEMI in March 2018 with DES to LAD and RCA, complicated by cardiogenic shock, low output requiring milrinone. Echo 8/18 with EF 20-25%, moderate MR. PX (8/18) with severe functional impairment due to HF.  He is now s/p HM3 LVAD placement. LVAD parameters reviewed today and stable.  Off NTG drip. Back on hydralazine.  - restart sildenafil 20 mg three times a day  - Continue ASA 81.  - INR 4.05. Add nepro. Continue warfarin INR 2-3 for now with no overt GI bleeding. Pharmacy following.  2. ESRD: Developed peri-operatively after HM3 placement. He is tolerating HD without problems currently. Had HD 1/9  Consulted Nephrology.  3. CAD: NSTEMI in 3/18. LHC with 99% ulcerated lesion proximal RCA with left to right collaterals, 95% mid LAD stenosis after mid LAD stent. s/p PCI to RCA and LAD on 11/25/16.  - No S/S ischemia.  - Continue statin and ASA. 4. h/o DVTs: Factor V Leiden heterozygote. On warfarin.  5. DM: Start sliding scale. Hgb A1C  6.1 6. HTN: NTG off. Taking oral HTN meds.  Maps in the 90s.  7. PAD: Moderate bilateral disease on ABIs in 8/18. Denies claudication currently, no pedal ulcers.  8. Atrial flutter:In Sinus Tach  9. Weight loss- in the setting N/V. Dietitian recs appreciated. Add nepro supplement. Still not eating.  Add marinol.  We will contact Dr [name] at [facility] regarding his gastroparesis. F/U has been set up. Dr [name] discussed GI next steps.    INR trending up. Minimal PO intake. Add Nepro supplement. This should help  and has vitamin K in the supplement. Add marinol.   Consult PT.   I reviewed the LVAD parameters from today, and compared the results to the patient's prior recorded data.  No programming changes were made.  The LVAD is functioning within specified parameters.  The patient performs LVAD self-test daily.  LVAD interrogation was negative for any significant power changes, alarms or PI events/speed drops.  LVAD equipment check completed and is in good working order.  Back-up equipment present.   LVAD education done on emergency procedures and precautions and reviewed exit site care.  Length of Stay: 3  [name] Grinder, [name] 09/15/2017, 11:11 AM  VAD Team --- VAD ISSUES ONLY--- Pager [phone] (7am -  7am)  Patient seen with [name], agree with the above note.  Slow improvement.  Less nauseated, now drinking liquids.  MAP high this afternoon, will add back home amlodipine 10 mg daily as well as sildenafil now that he is off NTG gtt.  VAD parameters stable.  Labs stable except INR elevated with poor po intake.  Will add Nepro supplement (has some vitamin K).    We are going to refer him to [facility] for possible gastric stimulator, appreciate Dr. Woodward Ku assistance with this.   [name] 09/15/2017 1:33 PM

## 2017-09-15 NOTE — Discharge Summary (Signed)
Advanced Heart Failure Team  Discharge Summary   Patient ID: [name] MRN: [id number], DOB/AGE: 10/18/1965 52 y.o. Admit date: 09/12/2017 D/C date:     09/16/2017   Primary Discharge Diagnoses:  1. Uncontrolled Vomiting, diabetic gastroparesis.  Continue phenergan suppositories. Continue reglan5 mg tid AC. Continue  erythromycin 250 mg tid.  He improved over several days, now eating regular meals.  - GI saw, recommend followup with gastroparesis specialist at [facility] to consider gastric stimulator.  - Continue erythromycin 250 mg tid for 7 days post-discharge.  2.Chronic systolic CHF: Ischemic cardiomyopathy. [employer] ICD. NSTEMI in March 2018 with DES to LAD and RCA, complicated by cardiogenic shock, low output requiring milrinone. Echo 8/18 with EF 20-25%, moderate MR. PX (8/18) with severe functional impairment due to HF Has HMIII LVAD. He is back on home BP meds now.  - Increased PI events post-HD => encouraged hydration.  - Continue home BP med regimen.  - Continue ASA 81. - INR trending down, 3.93.  Will need repeat INR on Monday, pharmacy to tell him when to resume warfarin.   2. ESRD: Developed peri-operatively after HM3 placement. He is tolerating HD without problems currently.Had HD 1/11.  3. CAD: NSTEMI in 3/18. LHC with 99% ulcerated lesion proximal RCA with left to right collaterals, 95% mid LAD stenosis after mid LAD stent. s/p PCI to RCA and LAD on 11/25/16.  4. h/o DVTs: Factor V Leiden heterozygote.  5. JK:DTOIZ sliding scale. Hgb A1C 6.1 6. HTN: MAP now lower after HD yesterday.  Can go home on home med regimen.  7. PAD: Moderate bilateral disease on ABIs in 8/18. Denies claudication currently, no pedal ulcers. 8. Atrial flutter:In Sinus Tach/SR 9. Weight loss, inadequate oral intake related to N/V and poor appetite. Dietitian recs appreciated. Add nepro supplement.  Added marinol.    Hospital Course:  52 yo with history of CAD s/p anterior MI  in 2010 and NSTEMI in 3/18 with DES to pRCA and mLAD and ischemic cardiomyopathy s/p Heartmate 3 LVAD, diabetes gastroparesis,and ESRD.   Admitted with uncontrolled nausea and vomiting. Started on scheduled phergan, ativan, and erythromycin. Maps elevated on admit so he was placed on NTG drip until he was able to take po medications. GI consulted with recommendations referral to [facility] for gastroparesis specialist.  He gradually improved and his home medications were resumed.   Nephrology consulted for iHD. He will continue iHD M-W-F. Due to poor po intake, dietitian consult was placed and he was started on supplements and marinol.     From HF perspective he improved and was able to tolerated a diet. He will continue to be followed closely in the HF clinic.     LVAD Interrogation HM II:   Speed: 5600    Flow: 4.6 liters/min     PI: 3   Power: 4.2      Back-up speed:     He is now eating normally, no nausea.  Think he can go home today.  Will need followup in LVAD clinic (arranged already). Should get INR on Monday. Meds for discharge: hydralazine 50 mg tid, sildenafil 20 mg tid, amlodipine 5 mg daily on non-HD days, warfarin per pharmacy (have them tell him when to resume warfarin, will need INR Monday at [place] clinic), ASA 81 daily, Protonix 40 daily, phenergan prn (needs po and suppositories), Reglan 5 mg po qac/qhs, erythromycin 250 mg tid x 7 days, atorvastatin 40 daily, home diabetes regimen.   The pharmacist here recommends to  send pt home with no coumadin dose today and 2.5 mg tomorrow. She will message Danae Chen at the LVAD coumadin clinic and the patient will be called on Monday for instructions.  Dr. Kirk Ruths reviewed the LVAD parameters from today, and compared the results to the patient's prior recorded data.  No programming changes were made.  The LVAD is functioning within specified parameters.  The patient performs LVAD self-test daily.  LVAD interrogation was negative for any significant  power changes, alarms or PI events/speed drops.  LVAD equipment check completed and is in good working order.  Back-up equipment present.   LVAD education done on emergency procedures and precautions and reviewed exit site care.    Discharge Weight: 165 lbs Discharge Vitals: Blood pressure (!) 89/54, pulse 90, temperature 98.5 F (36.9 C), temperature source Oral, resp. rate (!) 22, height 5' 8"  (1.727 m), weight 165 lb 5.5 oz (75 kg), SpO2 97 %.  Labs: Lab Results  Component Value Date   WBC 7.8 09/16/2017   HGB 11.7 (L) 09/16/2017   HCT 38.0 (L) 09/16/2017   MCV 85.8 09/16/2017   PLT 247 09/16/2017    Recent Labs  Lab 09/16/17 0214  NA 132*  K 3.7  CL 95*  CO2 26  BUN 28*  CREATININE 5.20*  CALCIUM 8.0*  GLUCOSE 149*   Lab Results  Component Value Date   CHOL 128 05/04/2017   HDL 50 05/04/2017   LDLCALC 54 05/04/2017   TRIG 118 05/04/2017   BNP (last 3 results) Recent Labs    06/18/17 2305 06/26/17 0004 07/03/17 0037  BNP 746.0* 1,409.3* 758.9*    ProBNP (last 3 results) No results for input(s): PROBNP in the last 8760 hours.   Diagnostic Studies/Procedures   No results found.  Discharge Medications   Allergies as of 09/16/2017      Reactions   Metformin And Related Diarrhea      Medication List    TAKE these medications   amLODipine 5 MG tablet Commonly known as:  NORVASC Take 1 tablet (5 mg total) by mouth daily. On non dialysis days. Start taking on:  09/17/2017 What changed:    medication strength  how much to take  additional instructions   aspirin EC 81 MG tablet Take 1 tablet (81 mg total) by mouth daily.   atorvastatin 40 MG tablet Commonly known as:  LIPITOR Take 1 tablet (40 mg total) daily at 6 PM by mouth.   docusate sodium 100 MG capsule Commonly known as:  COLACE Take 2 capsules (200 mg total) by mouth daily.   dronabinol 2.5 MG capsule Commonly known as:  MARINOL Take 1 capsule (2.5 mg total) by mouth 2 (two) times  daily before lunch and supper.   erythromycin 250 MG tablet Commonly known as:  E-MYCIN Take 1 tablet (250 mg total) by mouth 3 (three) times daily with meals.   feeding supplement (NEPRO CARB STEADY) Liqd Take 237 mLs by mouth 3 (three) times daily with meals.   FREESTYLE LIBRE SENSOR SYSTEM Misc Apply 1 reader to the skin every 10 days.   glucose blood test strip Commonly known as:  FREESTYLE PRECISION NEO TEST Use as instructed   hydrALAZINE 50 MG tablet Commonly known as:  APRESOLINE Take 1 tablet (50 mg total) by mouth 3 (three) times daily.   insulin aspart 100 UNIT/ML injection Commonly known as:  novoLOG Inject 3 Units into the skin 3 (three) times daily with meals. What changed:  how much to take  insulin glargine 100 UNIT/ML injection Commonly known as:  LANTUS Inject 0.05 mLs (5 Units total) into the skin daily.   LORazepam 0.5 MG tablet Commonly known as:  ATIVAN Take 1 tablet (0.5 mg total) by mouth every 12 (twelve) hours as needed for anxiety or sleep.   metoCLOPramide 5 MG tablet Commonly known as:  REGLAN Take 1 tablet (5 mg total) by mouth 4 (four) times daily -  before meals and at bedtime.   ondansetron 4 MG disintegrating tablet Commonly known as:  ZOFRAN ODT Take 1 tablet (4 mg total) by mouth every 6 (six) hours as needed for nausea or vomiting.   pantoprazole 40 MG tablet Commonly known as:  PROTONIX Take 40 mg by mouth daily.   promethazine 25 MG suppository Commonly known as:  PHENERGAN Place 25 mg rectally every 6 (six) hours as needed for nausea or vomiting. What changed:  Another medication with the same name was changed. Make sure you understand how and when to take each.   promethazine 12.5 MG tablet Commonly known as:  PHENERGAN Take 1 tablet (12.5 mg total) by mouth every 6 (six) hours as needed for nausea or vomiting. What changed:    medication strength  how much to take   sildenafil 20 MG tablet Commonly known as:   REVATIO Take 1 tablet (20 mg total) by mouth 3 (three) times daily.   warfarin 5 MG tablet Commonly known as:  COUMADIN Take as directed. If you are unsure how to take this medication, talk to your nurse or doctor. Original instructions:  Take 1 tablet (5 mg total) by mouth daily. Take as directed from coumadin clinic. See attached instruction. What changed:  additional instructions       Disposition   The patient will be discharged in stable condition to home. Discharge Instructions    (HEART FAILURE PATIENTS) Call MD:  Anytime you have any of the following symptoms: 1) 3 pound weight gain in 24 hours or 5 pounds in 1 week 2) shortness of breath, with or without a dry hacking cough 3) swelling in the hands, feet or stomach 4) if you have to sleep on extra pillows at night in order to breathe.   Complete by:  As directed    Diet - low sodium heart healthy   Complete by:  As directed    Increase activity slowly   Complete by:  As directed      Follow-up Information    Larey Dresser, MD Follow up on 09/21/2017.   Specialty:  Cardiology Why:   at [facility] **LVAD coumadin clinic will call you on Monday to arrange for blood work for coumadin.Conservator, museum/gallery information: [place] [address] [phone]             Duration of Discharge Encounter: Greater than 35 minutes   Signed, Daune Perch  09/16/2017, 2:35 PM

## 2017-09-15 NOTE — Progress Notes (Signed)
  [facility] Progress Note   Subjective:   Seen on HD, sedated, arousable  Objective Vitals:   09/15/17 0725 09/15/17 0800 09/15/17 0815 09/15/17 0820  BP: 121/84 (!) 91/33 (!) 89/57 (!) 88/57  Pulse: (!) 103 94 (!) 104 98  Resp:  17 16 16   Temp: 98.7 F (37.1 C) 98.5 F (36.9 C)    TempSrc: Oral Oral    SpO2: 96% 96%    Weight:      Height:       Physical Exam General: Resting quietly Heart: RRR Lungs: CTA Extremities: No LE edema Dialysis Access: [facility], maturing AVF  Additional Objective Labs: Basic Metabolic Panel: Recent Labs  Lab 09/13/17 0334 09/14/17 0343 09/15/17 0212  NA 134* 135 133*  K 4.0 3.7 3.5  CL 98* 96* 92*  CO2 24 27 25   GLUCOSE 218* 125* 162*  BUN 33* 10 27*  CREATININE 6.59* 4.22* 6.69*  CALCIUM 8.7* 8.7* 8.6*   CBC: Recent Labs  Lab 09/12/17 1034 09/13/17 0334 09/14/17 0343 09/15/17 0212  WBC 7.9 10.7* 11.2* 9.8  HGB 11.6* 10.5* 11.0* 11.9*  HCT 37.8* 34.3* 35.4* 39.1  MCV 84.6 83.5 85.3 85.2  PLT 243 243 257 280   Medications: . sodium chloride    . nitroGLYCERIN Stopped (09/14/17 1800)   . amLODipine  10 mg Oral Daily  . aspirin EC  81 mg Oral Daily  . atorvastatin  40 mg Oral q1800  . docusate sodium  200 mg Oral Daily  . erythromycin  250 mg Oral TID WC  . feeding supplement (PRO-STAT SUGAR FREE 64)  30 mL Oral TID BM  . hydrALAZINE  10 mg Intravenous Q4H  . hydrALAZINE  50 mg Oral TID  . insulin aspart  0-9 Units Subcutaneous TID WC  . metoCLOPramide (REGLAN) injection  5 mg Intravenous Q6H  . pantoprazole (PROTONIX) IV  40 mg Intravenous Q24H  . Warfarin - Pharmacist Dosing Inpatient   Does not apply q1800    Dialysis Orders: MWF at [facility] 4:15hr, 400/800, 3K/2Ca, EDW 80kg, TDC, heparin 8000 bolus - Venofer 41m IV weekly - Mircera 1591m IV q 2 weeks (last 1/2) - Calcitriol 0.68m74mPO q HD  Assessment: 1.  Gastroparesis (recurrent): Continue meds per primary team per GI recs. 2.  ESRD: Will  continue HD per MWF schedule. Next 1/11. 3.  Hypertension/volume: BP's are low, will dc norvasc. NTG off now. Is 5-6kg below his dry wt, no vol on exam. Keep even on HD today.  4.  Anemia: Hgb 10.5, no ESA yet. 5.  Metabolic bone disease: Ca ok, Phos pending. Monitor for now, not really tolerating PO meds, so BMM meds on hold for now. 6. Ischemic CM w/ LVAD: Per cardiology.  7. Type 2 DM: Per primary. On insulin.   Plan - HD today, no fluid off, dc norvasc   RobKelly Splinter CarNewell Rubbermaidger [phone]/07/2018, 9:25 AM

## 2017-09-15 NOTE — Progress Notes (Signed)
VAD coordinator accompanied pt to IP dialysis. Pt tolerating without event. Dialysis nurse given VAD pager and ask to contact if any VAD alarms. Nurse verbalized understanding of same.   Existing VAD dressing removed and site care performed using sterile technique. Drive line exit site cleaned with Chlora prep applicators x 2, allowed to dry, and Sorbaview dressing with bio patch re-applied. Exit site healed and incorporated, the velour is fully implanted at exit site. No redness, tenderness, drainage, foul odor or rash noted. Drive line anchor re-applied.   VAD coordinator accompanied patient back to [place] [facility].

## 2017-09-15 NOTE — Progress Notes (Signed)
ANTICOAGULATION CONSULT NOTE - Follow Up Consult  Pharmacy Consult for Coumadin Indication: LVAD, hx DVT and factor V Leiden  Allergies  Allergen Reactions  . Metformin And Related Diarrhea    Patient Measurements: Height: 5' 8"  (172.7 cm) Weight: 166 lb 14.2 oz (75.7 kg) IBW/kg (Calculated) : 68.4  Vital Signs: Temp: 98.5 F (36.9 C) (01/11 1215) Temp Source: Oral (01/11 1215) BP: 102/73 (01/11 1215) Pulse Rate: 95 (01/11 1215)  Labs: Recent Labs    09/13/17 0334 09/14/17 0343 09/15/17 0212  HGB 10.5* 11.0* 11.9*  HCT 34.3* 35.4* 39.1  PLT 243 257 280  LABPROT 27.4* 31.5* 39.0*  INR 2.57 3.07 4.05*  CREATININE 6.59* 4.22* 6.69*    Estimated Creatinine Clearance: 12.5 mL/min (A) (by C-G formula based on SCr of 6.69 mg/dL (H)).  Assessment: 39 yom s/p LVAD continues on coumadin. INR has trended up as expected and is now supratherapeutic at 4. Continues on erythromycin which can sometimes elevate INR. Added nepro supplements today which contain ~ 25% vitamin k. CBC/LDH stable. No bleeding reported.  Home dose: 71m daily  Goal of Therapy:  INR 2-3 Monitor platelets by anticoagulation protocol: Yes   Plan:  1) Hold coumadin tonight 2) Daily INR  MDeboraha Sprang1/07/2018,1:22 PM

## 2017-09-15 NOTE — Telephone Encounter (Signed)
Called Dr Alyson Ingles office [phone]), couldn't get in touch with him . Left a message with his scheduler requesting earlier appointment within next 2-4 weeks to evaluate for possible Gastric stimulator and management of severe diabetic gastroparesis. Damaris Hippo , MD [phone] Mon-Fri 8a-5p [phone] after 5p, weekends, holidays

## 2017-09-16 LAB — CBC
HCT: 38 % — ABNORMAL LOW (ref 39.0–52.0)
Hemoglobin: 11.7 g/dL — ABNORMAL LOW (ref 13.0–17.0)
MCH: 26.4 pg (ref 26.0–34.0)
MCHC: 30.8 g/dL (ref 30.0–36.0)
MCV: 85.8 fL (ref 78.0–100.0)
PLATELETS: 247 10*3/uL (ref 150–400)
RBC: 4.43 MIL/uL (ref 4.22–5.81)
RDW: 22.3 % — ABNORMAL HIGH (ref 11.5–15.5)
WBC: 7.8 10*3/uL (ref 4.0–10.5)

## 2017-09-16 LAB — GLUCOSE, CAPILLARY
GLUCOSE-CAPILLARY: 121 mg/dL — AB (ref 65–99)
GLUCOSE-CAPILLARY: 147 mg/dL — AB (ref 65–99)
GLUCOSE-CAPILLARY: 176 mg/dL — AB (ref 65–99)

## 2017-09-16 LAB — BASIC METABOLIC PANEL
ANION GAP: 11 (ref 5–15)
BUN: 28 mg/dL — ABNORMAL HIGH (ref 6–20)
CO2: 26 mmol/L (ref 22–32)
Calcium: 8 mg/dL — ABNORMAL LOW (ref 8.9–10.3)
Chloride: 95 mmol/L — ABNORMAL LOW (ref 101–111)
Creatinine, Ser: 5.2 mg/dL — ABNORMAL HIGH (ref 0.61–1.24)
GFR, EST AFRICAN AMERICAN: 13 mL/min — AB (ref >60)
GFR, EST NON AFRICAN AMERICAN: 12 mL/min — AB (ref >60)
Glucose, Bld: 149 mg/dL — ABNORMAL HIGH (ref 65–99)
POTASSIUM: 3.7 mmol/L (ref 3.5–5.1)
SODIUM: 132 mmol/L — AB (ref 135–145)

## 2017-09-16 LAB — LACTATE DEHYDROGENASE: LDH: 260 U/L — AB (ref 98–192)

## 2017-09-16 LAB — PROTIME-INR
INR: 3.93
Prothrombin Time: 38.2 seconds — ABNORMAL HIGH (ref 11.4–15.2)

## 2017-09-16 MED ORDER — AMLODIPINE BESYLATE 5 MG PO TABS: 5.0000 mg | ORAL_TABLET | Freq: Every day | ORAL | 3 refills | Status: DC

## 2017-09-16 MED ORDER — WARFARIN SODIUM 5 MG PO TABS: 5.0000 mg | ORAL_TABLET | Freq: Every day | ORAL | 0 refills | Status: DC

## 2017-09-16 MED ORDER — NEPRO/CARBSTEADY PO LIQD: 237.0000 mL | Freq: Three times a day (TID) | ORAL | 0 refills | Status: DC

## 2017-09-16 MED ORDER — DRONABINOL 2.5 MG PO CAPS: 2.5000 mg | ORAL_CAPSULE | Freq: Two times a day (BID) | ORAL | 1 refills | Status: DC

## 2017-09-16 MED ORDER — ERYTHROMYCIN BASE 250 MG PO TABS: 250.0000 mg | ORAL_TABLET | Freq: Three times a day (TID) | ORAL | 0 refills | Status: DC

## 2017-09-16 MED ORDER — AMLODIPINE BESYLATE 5 MG PO TABS: 5.0000 mg | ORAL_TABLET | Freq: Every day | ORAL | Status: DC

## 2017-09-16 MED ORDER — PROMETHAZINE HCL 12.5 MG PO TABS: 12.5000 mg | ORAL_TABLET | Freq: Four times a day (QID) | ORAL | 0 refills | Status: DC | PRN

## 2017-09-16 NOTE — Progress Notes (Signed)
  [facility] Progress Note   Subjective:   Feeling much better, had solid food for breakfast  Objective Vitals:   09/16/17 0459 09/16/17 0500 09/16/17 0737 09/16/17 0800  BP: (!) 83/54  (!) 78/63   Pulse:   (!) 20   Resp:   19   Temp:   98.2 F (36.8 C)   TempSrc:   Oral   SpO2:    96%  Weight:  75 kg (165 lb 5.5 oz)    Height:       Physical Exam General: Resting quietly Heart: RRR Lungs: CTA Extremities: No LE edema Dialysis Access: [facility], maturing AVF  Additional Objective Labs: Basic Metabolic Panel: Recent Labs  Lab 09/14/17 0343 09/15/17 0212 09/16/17 0214  NA 135 133* 132*  K 3.7 3.5 3.7  CL 96* 92* 95*  CO2 27 25 26   GLUCOSE 125* 162* 149*  BUN 10 27* 28*  CREATININE 4.22* 6.69* 5.20*  CALCIUM 8.7* 8.6* 8.0*   CBC: Recent Labs  Lab 09/12/17 1034 09/13/17 0334 09/14/17 0343 09/15/17 0212 09/16/17 0214  WBC 7.9 10.7* 11.2* 9.8 7.8  HGB 11.6* 10.5* 11.0* 11.9* 11.7*  HCT 37.8* 34.3* 35.4* 39.1 38.0*  MCV 84.6 83.5 85.3 85.2 85.8  PLT 243 243 257 280 247   Medications: . sodium chloride    . nitroGLYCERIN Stopped (09/14/17 1800)   . amLODipine  10 mg Oral Daily  . aspirin EC  81 mg Oral Daily  . atorvastatin  40 mg Oral q1800  . docusate sodium  200 mg Oral Daily  . dronabinol  2.5 mg Oral BID AC  . erythromycin  250 mg Oral TID WC  . feeding supplement (NEPRO CARB STEADY)  237 mL Oral TID WC  . feeding supplement (PRO-STAT SUGAR FREE 64)  30 mL Oral TID BM  . hydrALAZINE  50 mg Oral TID  . insulin aspart  0-9 Units Subcutaneous TID WC  . metoCLOPramide (REGLAN) injection  5 mg Intravenous Q6H  . pantoprazole (PROTONIX) IV  40 mg Intravenous Q24H  . sildenafil  20 mg Oral TID  . Warfarin - Pharmacist Dosing Inpatient   Does not apply q1800    Dialysis Orders: MWF at [facility] 4:15hr, 400/800, 3K/2Ca, EDW 80kg, TDC, heparin 8000 bolus - Venofer 20m IV weekly - Mircera 15107m IV q 2 weeks (last 1/2) - Calcitriol 0.57m36mPO q  HD  Assessment: 1.  Gastroparesis (recurrent): improving today 2.  ESRD: cont MWF HD.  3.  Hypotension: BP's are low, norvasc on hold. 5kg under dry wt. Stable.  4.  Anemia: Hgb 10.5, no ESA yet. 5.  Metabolic bone disease: no chg meds for now 6.  Ischemic CM w/ LVAD: Per cardiology.  7. Type 2 DM: Per primary. On insulin.   Plan - HD Monday , if still here   RobKelly Splinter CarPrince Frederick Surgery Center LLCger [phone]/08/2018, 11:15 AM

## 2017-09-16 NOTE — Progress Notes (Signed)
ANTICOAGULATION CONSULT NOTE - Follow Up Consult  Pharmacy Consult for Coumadin Indication: LVAD, hx DVT and factor V Leiden  Allergies  Allergen Reactions  . Metformin And Related Diarrhea    Patient Measurements: Height: 5' 8"  (172.7 cm) Weight: 165 lb 5.5 oz (75 kg) IBW/kg (Calculated) : 68.4  Vital Signs: Temp: 98.2 F (36.8 C) (01/12 0737) Temp Source: Oral (01/12 0737) BP: 78/63 (01/12 0737) Pulse Rate: 20 (01/12 0737)  Labs: Recent Labs    09/14/17 0343 09/15/17 0212 09/16/17 0214  HGB 11.0* 11.9* 11.7*  HCT 35.4* 39.1 38.0*  PLT 257 280 247  LABPROT 31.5* 39.0* 38.2*  INR 3.07 4.05* 3.93  CREATININE 4.22* 6.69* 5.20*    Estimated Creatinine Clearance: 16.1 mL/min (A) (by C-G formula based on SCr of 5.2 mg/dL (H)).  Assessment: 67 yom s/p LVAD continues on coumadin. INR has trended up as expected and is supratherapeutic at 3.93. Continues on erythromycin which can sometimes elevate INR. Also now on nepro supplements which contain ~ 25% vitamin k. CBC/LDH stable. No bleeding reported.  Home dose: Coumadin 26m daily  Goal of Therapy:  INR 2-3 Monitor platelets by anticoagulation protocol: Yes   Plan:  1) Hold coumadin tonight 2) Daily INR, CBC 3) Monitor for bleeding   Diana L. RKyung Rudd PharmD, [id number] Pharmacy Resident Pager: [phone]

## 2017-09-16 NOTE — Progress Notes (Addendum)
CARDIAC REHAB PHASE I   PRE:  Rate/Rhythm: 91    BP:  Sitting: MAP 70       MODE:  Ambulation: 100 ft   POST:  Rate/Rhythm: 98  BP:  Sitting: MAP 64      1:18P-1:45P Patient ambulated slowly with walker. Encouraged to keep going but stated he needed to go back to his room due to fatigue. Patient in chair with call bell in reach eating lunch. Discussed light walking at home around the house upon discharge. Gave written guidelines.   [place], MS 09/16/2017 2:03 PM

## 2017-09-16 NOTE — Discharge Instructions (Signed)
For coumadin- do not take a dose today. Take 2.5 mg tomorrow. You will be called on Monday with further instructions. Will need to go to the clinic to have blood checked on Monday.

## 2017-09-16 NOTE — Progress Notes (Signed)
Patient ID: [name], male   DOB: [birth year], 52 y.o.   MRN: [id number]   Advanced Heart Failure VAD Team Note  Subjective:    Admitted with uncontrolled N/V. GI/Nephrology consulted.   Denies SOB. Nausea resolved, now eating meals.  Had HD yesterday.   MAP 70 this morning, held am hydralazine dose.    LVAD INTERROGATION:  HeartMate II LVAD:  Flow 4.6  liters/min, speed 5600, power 4.2, PI 3.  Multiple PI events post-HD yesterday.    Objective:    Vital Signs:   Temp:  [98.2 F (36.8 C)-99.4 F (37.4 C)] 98.2 F (36.8 C) (01/12 0737) Pulse Rate:  [20-120] 20 (01/12 0737) Resp:  [16-27] 19 (01/12 0737) BP: (61-132)/(36-92) 78/63 (01/12 0737) SpO2:  [94 %-97 %] 96 % (01/12 0800) Weight:  [165 lb 5.5 oz (75 kg)-166 lb 14.2 oz (75.7 kg)] 165 lb 5.5 oz (75 kg) (01/12 0500) Last BM Date: 09/12/17 Mean arterial Pressure 70   Intake/Output:   Intake/Output Summary (Last 24 hours) at 09/16/2017 1124 Last data filed at 09/15/2017 1900 Gross per 24 hour  Intake 240 ml  Output 0 ml  Net 240 ml     Physical Exam   Physical Exam: GENERAL: Well appearing this am. NAD.  HEENT: Normal. NECK: Supple, JVP 7-8 cm. Carotids OK.  CARDIAC:  Mechanical heart sounds with LVAD hum present.  LUNGS:  CTAB, normal effort.  ABDOMEN:  NT, ND, no HSM. No bruits or masses. +BS  LVAD exit site: Well-healed and incorporated. Dressing dry and intact. No erythema or drainage. Stabilization device present and accurately applied. Driveline dressing changed daily per sterile technique. EXTREMITIES:  Warm and dry. No cyanosis, clubbing, rash, or edema.  NEUROLOGIC:  Alert & oriented x 3. Cranial nerves grossly intact. Moves all 4 extremities w/o difficulty. Affect pleasant    Telemetry   NSR 80s-90s, personally reviewed.    EKG   Pending.  Labs   Basic Metabolic Panel: Recent Labs  Lab 09/12/17 1034 09/13/17 0334 09/14/17 0343 09/15/17 0212 09/16/17 0214  NA 135 134* 135 133* 132*    K 3.7 4.0 3.7 3.5 3.7  CL 98* 98* 96* 92* 95*  CO2 23 24 27 25 26   GLUCOSE 167* 218* 125* 162* 149*  BUN 24* 33* 10 27* 28*  CREATININE 5.25* 6.59* 4.22* 6.69* 5.20*  CALCIUM 9.0 8.7* 8.7* 8.6* 8.0*    Liver Function Tests: No results for input(s): AST, ALT, ALKPHOS, BILITOT, PROT, ALBUMIN in the last 168 hours. No results for input(s): LIPASE, AMYLASE in the last 168 hours. No results for input(s): AMMONIA in the last 168 hours.  CBC: Recent Labs  Lab 09/12/17 1034 09/13/17 0334 09/14/17 0343 09/15/17 0212 09/16/17 0214  WBC 7.9 10.7* 11.2* 9.8 7.8  HGB 11.6* 10.5* 11.0* 11.9* 11.7*  HCT 37.8* 34.3* 35.4* 39.1 38.0*  MCV 84.6 83.5 85.3 85.2 85.8  PLT 243 243 257 280 247    INR: Recent Labs  Lab 09/12/17 1034 09/13/17 0334 09/14/17 0343 09/15/17 0212 09/16/17 0214  INR 2.22 2.57 3.07 4.05* 3.93    Other results:  EKG:    Imaging   No results found.   Medications:     Scheduled Medications: . amLODipine  10 mg Oral Daily  . aspirin EC  81 mg Oral Daily  . atorvastatin  40 mg Oral q1800  . docusate sodium  200 mg Oral Daily  . dronabinol  2.5 mg Oral BID AC  . erythromycin  250  mg Oral TID WC  . feeding supplement (NEPRO CARB STEADY)  237 mL Oral TID WC  . feeding supplement (PRO-STAT SUGAR FREE 64)  30 mL Oral TID BM  . hydrALAZINE  50 mg Oral TID  . insulin aspart  0-9 Units Subcutaneous TID WC  . metoCLOPramide (REGLAN) injection  5 mg Intravenous Q6H  . pantoprazole (PROTONIX) IV  40 mg Intravenous Q24H  . sildenafil  20 mg Oral TID  . Warfarin - Pharmacist Dosing Inpatient   Does not apply q1800    Infusions: . sodium chloride    . nitroGLYCERIN Stopped (09/14/17 1800)    PRN Medications: Place/Maintain arterial line **AND** sodium chloride, acetaminophen, hydrALAZINE, LORazepam, LORazepam, ondansetron (ZOFRAN) IV, ondansetron, promethazine   Patient Profile  52 yo with history of CAD s/p anterior MI in 2010 and NSTEMI in 3/18 with  DES to pRCA and mLAD and ischemic cardiomyopathy  s/p Heartmate 3 LVAD, diabetes gastroparesis,  and ESRD.   Admitted with uncontrolled nausea and vomiting.     Assessment/Plan:    1. Diabetic gastroparesis: Admitted with uncontrolled nausea/vomiting.  Started on IV Reglan and IV phenergan.  Erythromycin 250 tid started.  He improved over several days, now eating regular meals.  - GI saw, recommend followup with gastroparesis specialist at [facility] to consider gastric stimulator.  - Continue erythromycin 250 mg tid for 7 days post-discharge.  2. Chronic systolic CHF: Ischemic cardiomyopathy. St Jude ICD. NSTEMI in March 2018 with DES to LAD and RCA, complicated by cardiogenic shock, low output requiring milrinone. Echo 8/18 with EF 20-25%, moderate MR. He is now s/p HM3 LVAD placement. He is back on home BP meds now.  - Increased PI events post-HD => encouraged hydration.   - Continue home BP med regimen.  - Continue ASA 81.  - INR trending down, 3.93.  Will need repeat INR on Monday, pharmacy to tell him when to resume warfarin.  2. ESRD: Developed peri-operatively after HM3 placement. He is tolerating HD without problems currently. Had HD 1/11.  3. CAD: NSTEMI in 3/18. LHC with 99% ulcerated lesion proximal RCA with left to right collaterals, 95% mid LAD stenosis after mid LAD stent. s/p PCI to RCA and LAD on 11/25/16.  - Continue statin and ASA. 4. h/o DVTs: Factor V Leiden heterozygote. On warfarin.  5. DM: Start sliding scale. Hgb A1C  6.1 6. HTN: MAP now lower after HD yesterday.  Can go home on home med regimen.  7. PAD: Moderate bilateral disease on ABIs in 8/18. Denies claudication currently, no pedal ulcers.  8. Atrial flutter:NSR this admission.  9. Weight loss: in the setting N/V. Dietitian recs appreciated. Add nepro supplement.  Added marinol.  He is now eating normally, no nausea.  Think he can go home today.  Will need followup in LVAD clinic (arranged already). Should  get INR on Monday. Meds for discharge: hydralazine 50 mg tid, sildenafil 20 mg tid, amlodipine 5 mg daily on non-HD days, warfarin per pharmacy (have them tell him when to resume warfarin, will need INR Monday at [facility]), ASA 81 daily, Protonix 40 daily, phenergan prn (needs po and suppositories), Reglan 5 mg po qac/qhs, erythromycin 250 mg tid x 7 days, atorvastatin 40 daily, home diabetes regimen.   I reviewed the LVAD parameters from today, and compared the results to the patient's prior recorded data.  No programming changes were made.  The LVAD is functioning within specified parameters.  The patient performs LVAD self-test daily.  LVAD  interrogation was negative for any significant power changes, alarms or PI events/speed drops.  LVAD equipment check completed and is in good working order.  Back-up equipment present.   LVAD education done on emergency procedures and precautions and reviewed exit site care.  Length of Stay: 4  Loralie Champagne, MD 09/16/2017, 11:24 AM  VAD Team --- VAD ISSUES ONLY--- Pager [phone] (7am - 7am)

## 2017-09-16 NOTE — Evaluation (Addendum)
Physical Therapy Evaluation and D/C Patient Details Name: [name] MRN: [id number] DOB: [birth year] Today's Date: 09/16/2017   History of Present Illness  52 yo with history of CAD s/p anterior MI in 2010 and NSTEMI in 3/18 with DES to pRCA and mLAD and ischemic cardiomyopathy s/p Heartmate 3 LVAD, diabetes gastroparesis,and ESRD.  Admit with uncontrolled N/V.  Thought to be diabetic gastroparesis.    Clinical Impression  Pt admitted with above diagnosis. Pt currently without significant functional limitations and appears to be functioning close to  Baseline as he states he uses cane or RW depending on how he feels.  VEry safe with RW. Pt does not feel that he needs f/u PT and this PT agrees.  Will sign off.   Follow Up Recommendations No PT follow up    Equipment Recommendations  None recommended by PT    Recommendations for Other Services       Precautions / Restrictions Precautions Precautions: Fall Precaution Comments: LVAD Restrictions Weight Bearing Restrictions: No      Mobility  Bed Mobility               General bed mobility comments: In chair on arrival  Transfers Overall transfer level: Needs assistance Equipment used: Rolling walker (2 wheeled) Transfers: Sit to/from Stand Sit to Stand: Supervision         General transfer comment: No assist to stand.   Ambulation/Gait Ambulation/Gait assistance: Supervision Ambulation Distance (Feet): 250 Feet Assistive device: Rolling walker (2 wheeled) Gait Pattern/deviations: Step-through pattern;Decreased stride length   Gait velocity interpretation: Below normal speed for age/gender General Gait Details: Pt was able to ambulate with RW without physical assist.  Safe with RW.  Was able to manipulate all LVAD parts without assist as well.    Stairs  Declined stating that he has a routine with his son.           Wheelchair Mobility    Modified Rankin (Stroke Patients Only)        Balance Overall balance assessment: Needs assistance;History of Falls         Standing balance support: Bilateral upper extremity supported;During functional activity Standing balance-[name]: Fair Standing balance comment: Pt able to stand with RW without assist and withstands challenges.                              Pertinent Vitals/Pain Pain Assessment: No/denies pain  VSS  Home Living Family/patient expects to be discharged to:: Private residence Living Arrangements: Children Available Help at Discharge: Family;Available 24 hours/day Type of Home: House Home Access: Stairs to enter Entrance Stairs-Rails: None Entrance Stairs-Number of Steps: 2 Home Layout: Multi-level;Able to live on main level with bedroom/bathroom Home Equipment: Kasandra Knudsen - single point;Walker - 4 wheels Additional Comments: pt lives with son and daughter, one of which is with him all the time. His bedroom is downstairs but the kids have moved him to the main level so he will only have to do 2 STE    Prior Function Level of Independence: Independent with assistive device(s);Needs assistance   Gait / Transfers Assistance Needed: Uses rollator for gait  ADL's / Homemaking Assistance Needed: reports having assistance for LB dressing        Hand Dominance   Dominant Hand: Right    Extremity/Trunk Assessment   Upper Extremity Assessment Upper Extremity Assessment: Defer to OT evaluation    Lower Extremity Assessment Lower Extremity Assessment: Generalized weakness  Cervical / Trunk Assessment Cervical / Trunk Assessment: Normal  Communication   Communication: No difficulties  Cognition Arousal/Alertness: Awake/alert Behavior During Therapy: Flat affect Overall Cognitive Status: Within Functional Limits for tasks assessed                                        General Comments      Exercises     Assessment/Plan    PT Assessment Patent does not need  any further PT services  PT Problem List Decreased mobility       PT Treatment Interventions Gait training;Functional mobility training;Patient/family education    PT Goals (Current goals can be found in the Care Plan section)  Acute Rehab PT Goals Patient Stated Goal: to go home PT Goal Formulation: All assessment and education complete, DC therapy    Frequency     Barriers to discharge        Co-evaluation               AM-PAC PT "6 Clicks" Daily Activity  Outcome Measure Difficulty turning over in bed (including adjusting bedclothes, sheets and blankets)?: None Difficulty moving from lying on back to sitting on the side of the bed? : None Difficulty sitting down on and standing up from a chair with arms (e.g., wheelchair, bedside commode, etc,.)?: None Help needed moving to and from a bed to chair (including a wheelchair)?: None Help needed walking in hospital room?: None Help needed climbing 3-5 steps with a railing? : A Little 6 Click Score: 23    End of Session Equipment Utilized During Treatment: Gait belt Activity Tolerance: Patient tolerated treatment well Patient left: in chair;with call bell/phone within reach Nurse Communication: Mobility status PT Visit Diagnosis: Muscle weakness (generalized) (M62.81)    Time: 4580-9983 PT Time Calculation (min) (ACUTE ONLY): 15 min   Charges:   PT Evaluation $PT Eval Low Complexity: 1 Low     PT G Codes:        Coby Shrewsberry,PT Acute Rehabilitation [phone] (pager)   Denice Paradise 09/16/2017, 3:02 PM

## 2017-09-19 ENCOUNTER — Telehealth (HOSPITAL_COMMUNITY): Payer: Self-pay | Admitting: Pharmacist

## 2017-09-19 NOTE — Telephone Encounter (Signed)
Called and left VM to remind patient to check home INR.   Ruta Hinds. Velva Harman, PharmD, BCPS, CPP Clinical Pharmacist Phone: [phone] 09/19/2017 4:32 PM

## 2017-09-21 ENCOUNTER — Encounter (HOSPITAL_COMMUNITY): Payer: Self-pay

## 2017-09-21 ENCOUNTER — Other Ambulatory Visit (HOSPITAL_COMMUNITY): Payer: Self-pay | Admitting: *Deleted

## 2017-09-21 ENCOUNTER — Telehealth (HOSPITAL_COMMUNITY): Payer: Self-pay | Admitting: *Deleted

## 2017-09-21 DIAGNOSIS — Z7901 Long term (current) use of anticoagulants: Secondary | ICD-10-CM

## 2017-09-21 DIAGNOSIS — Z95811 Presence of heart assist device: Secondary | ICD-10-CM

## 2017-09-21 DIAGNOSIS — I5043 Acute on chronic combined systolic (congestive) and diastolic (congestive) heart failure: Secondary | ICD-10-CM

## 2017-09-21 NOTE — Telephone Encounter (Signed)
Pt called VAD office. States he "just couldn't get here today". Asked pt to call and cancel/re-schedule in the future. He says he will be here tomorrow at 11:00 am.  Pt also reports he performed home INR Monday, 09/18/17 and it was 1.9. Updated Vernard Gambles, PharmD.

## 2017-09-21 NOTE — Telephone Encounter (Signed)
Called pt and left message re: missed clinic appointment today and missed home INR Monday, 09/18/17.   Also left message re: Dr. Alyson Ingles office [facility] GI Specialist) has been trying to reach patient to schedule earlier appointment within next 2 - 4 weeks per Dr. Woodward Ku request.  Asked pt to call us and confirm he got the message. Also told him we need better contact #'s for him.  Also called daughter and left message re: all the above. Told her we had re-scheduled pt for VAD clinic appointment tomorrow at 11:00am. Asked that she get this message to pt and assure that he comes for f/u.

## 2017-09-21 NOTE — Addendum Note (Signed)
Addended by: Zada Girt B on: 09/21/2017 07:19 AM   Modules accepted: Orders

## 2017-09-21 NOTE — Addendum Note (Signed)
Addended by: Zada Girt B on: 09/21/2017 07:18 AM   Modules accepted: Orders

## 2017-09-21 NOTE — Addendum Note (Signed)
Addended by: Zada Girt B on: 09/21/2017 07:42 AM   Modules accepted: Orders

## 2017-09-21 NOTE — Addendum Note (Signed)
Addended by: Zada Girt B on: 09/21/2017 07:21 AM   Modules accepted: Orders

## 2017-09-21 NOTE — Addendum Note (Signed)
Addended by: Zada Girt B on: 09/21/2017 07:20 AM   Modules accepted: Orders

## 2017-09-22 ENCOUNTER — Ambulatory Visit (HOSPITAL_COMMUNITY)
Admission: RE | Admit: 2017-09-22 | Discharge: 2017-09-22 | Disposition: A | Discharge status: Home / Self Care | Payer: BLUE CROSS/BLUE SHIELD | Source: Ambulatory Visit | Attending: Internal Medicine | Admitting: Internal Medicine

## 2017-09-22 ENCOUNTER — Ambulatory Visit (HOSPITAL_COMMUNITY): Payer: Self-pay | Admitting: Pharmacist

## 2017-09-22 DIAGNOSIS — Z794 Long term (current) use of insulin: Secondary | ICD-10-CM | POA: Diagnosis not present

## 2017-09-22 DIAGNOSIS — I2510 Atherosclerotic heart disease of native coronary artery without angina pectoris: Secondary | ICD-10-CM | POA: Insufficient documentation

## 2017-09-22 DIAGNOSIS — I5022 Chronic systolic (congestive) heart failure: Secondary | ICD-10-CM | POA: Diagnosis not present

## 2017-09-22 DIAGNOSIS — Z7901 Long term (current) use of anticoagulants: Secondary | ICD-10-CM

## 2017-09-22 DIAGNOSIS — Z95811 Presence of heart assist device: Secondary | ICD-10-CM | POA: Diagnosis not present

## 2017-09-22 DIAGNOSIS — I252 Old myocardial infarction: Secondary | ICD-10-CM | POA: Insufficient documentation

## 2017-09-22 DIAGNOSIS — I214 Non-ST elevation (NSTEMI) myocardial infarction: Secondary | ICD-10-CM | POA: Insufficient documentation

## 2017-09-22 DIAGNOSIS — Z7982 Long term (current) use of aspirin: Secondary | ICD-10-CM | POA: Diagnosis not present

## 2017-09-22 DIAGNOSIS — E1122 Type 2 diabetes mellitus with diabetic chronic kidney disease: Secondary | ICD-10-CM | POA: Insufficient documentation

## 2017-09-22 DIAGNOSIS — I255 Ischemic cardiomyopathy: Secondary | ICD-10-CM | POA: Insufficient documentation

## 2017-09-22 DIAGNOSIS — Z5181 Encounter for therapeutic drug level monitoring: Secondary | ICD-10-CM

## 2017-09-22 DIAGNOSIS — Z79899 Other long term (current) drug therapy: Secondary | ICD-10-CM | POA: Diagnosis not present

## 2017-09-22 DIAGNOSIS — I5043 Acute on chronic combined systolic (congestive) and diastolic (congestive) heart failure: Secondary | ICD-10-CM

## 2017-09-22 DIAGNOSIS — I82499 Acute embolism and thrombosis of other specified deep vein of unspecified lower extremity: Secondary | ICD-10-CM

## 2017-09-22 DIAGNOSIS — I132 Hypertensive heart and chronic kidney disease with heart failure and with stage 5 chronic kidney disease, or end stage renal disease: Secondary | ICD-10-CM | POA: Insufficient documentation

## 2017-09-22 DIAGNOSIS — N186 End stage renal disease: Secondary | ICD-10-CM | POA: Insufficient documentation

## 2017-09-22 LAB — COMPREHENSIVE METABOLIC PANEL
ALBUMIN: 3 g/dL — AB (ref 3.5–5.0)
ALT: 10 U/L — ABNORMAL LOW (ref 17–63)
ANION GAP: 15 (ref 5–15)
AST: 17 U/L (ref 15–41)
Alkaline Phosphatase: 150 U/L — ABNORMAL HIGH (ref 38–126)
BILIRUBIN TOTAL: 0.6 mg/dL (ref 0.3–1.2)
BUN: 31 mg/dL — AB (ref 6–20)
CHLORIDE: 97 mmol/L — AB (ref 101–111)
CO2: 18 mmol/L — ABNORMAL LOW (ref 22–32)
Calcium: 8.4 mg/dL — ABNORMAL LOW (ref 8.9–10.3)
Creatinine, Ser: 7.66 mg/dL — ABNORMAL HIGH (ref 0.61–1.24)
GFR calc Af Amer: 8 mL/min — ABNORMAL LOW (ref >60)
GFR calc non Af Amer: 7 mL/min — ABNORMAL LOW (ref >60)
GLUCOSE: 325 mg/dL — AB (ref 65–99)
POTASSIUM: 3.4 mmol/L — AB (ref 3.5–5.1)
SODIUM: 130 mmol/L — AB (ref 135–145)
TOTAL PROTEIN: 7.3 g/dL (ref 6.5–8.1)

## 2017-09-22 LAB — CBC
HEMATOCRIT: 39.9 % (ref 39.0–52.0)
Hemoglobin: 12.7 g/dL — ABNORMAL LOW (ref 13.0–17.0)
MCH: 26.6 pg (ref 26.0–34.0)
MCHC: 31.8 g/dL (ref 30.0–36.0)
MCV: 83.5 fL (ref 78.0–100.0)
PLATELETS: 218 10*3/uL (ref 150–400)
RBC: 4.78 MIL/uL (ref 4.22–5.81)
RDW: 19.7 % — AB (ref 11.5–15.5)
WBC: 7.5 10*3/uL (ref 4.0–10.5)

## 2017-09-22 LAB — LACTATE DEHYDROGENASE: LDH: 274 U/L — ABNORMAL HIGH (ref 98–192)

## 2017-09-22 LAB — PROTIME-INR
INR: 1.08
Prothrombin Time: 13.9 seconds (ref 11.4–15.2)

## 2017-09-22 MED ORDER — ENOXAPARIN SODIUM 80 MG/0.8ML ~~LOC~~ SOLN: 75.0000 mg | Freq: Two times a day (BID) | SUBCUTANEOUS | 1 refills | Status: DC

## 2017-09-22 NOTE — Progress Notes (Signed)
Cardiology: Dr. Aundra Dubin  HPI: 52 yo with history of CAD s/p anterior MI in 2010 and NSTEMI in 3/18 with DES to pRCA and mLAD and ischemic cardiomyopathy now s/p Heartmate 3 LVAD and ESRD presents for followup of CHF/LVAD.  Patient developed low output heart failure.  He was not deemed to be a good transplant candidate.  He was admitted in 10/18 and Heartmate 3 LVAD was placed.  He had baseline CKD stage 3 likely from diabetic nephropathy and CHF.  He developed peri-op AKI and ended up on dialysis.  No renal recovery to date.  St Jude ICD has been turned back on and ramp echo was done prior to discharge in 10/18, speed increased to 5600 rpm.    He was admitted in 11/18 with nausea/vomiting and abdominal pain.  This appears to have been due to diabetic gastroparesis (extensive GI evaluation). He had atrial flutter this admission that was converted to NSR with amiodarone.  He improved considerably with scheduled Reglan and erythromycin. He was re-admitted later in 11/18 with the same symptoms, suspect diabetic gastroparesis and was again treated with erythromycin.  He was not able to start domperidone due to prolonged QT interval. He had nausea again about a week ago but was able to manage at home with Phenergan suppositories.   Admitted 1/8 -> 09/16/17 for uncontrolled vomiting in setting of diabetic gastroparesis. Improved with phenergan suppositories and given erythromycin TID for 7 days post-discharge.  Referred for follow up with a gastroparesis specialist at [facility].   Pt presents today for post hospital follow up. Doing well. Has required no further Zofran or phenergan. He is feeling much better overall. He did have episode of lightheadedness and frequent PI events during HD this week, so dry weight has been increased. Denies LVAD alarms. Denies driveline trauma, erythemia, or drainage. No ICD shocks.   Reports taking Coumadin as prescribed and adherence to anticoagulation based dietary restrictions.  Denies BRBPR or melena. No dark urine or hematuria.   Labs (11/18): hgb 10.4 => 8.7, LDH 213 Labs (08/04/17): hgb 9  PMH: 1. Chronic systolic CHF: Ischemic cardiomyopathy. St Jude ICD.  - Echo (11/17) with EF 25-30%, moderate diastolic dysfunction, mild MR, mildly dilated RV with moderately decreased systolic function, peak RV-RA gradient 48 mmHg.  - Echo (3/18) with EF 25-30%, wall motion abnormalities, normal RV size and systolic function.  - Admission 3/18 with cardiogenic shock and AKI, required milrinone gtt.  - Echo (8/18) with EF 20-25%, mild LVH, moderate LV dilation, mild MR.  - CPX (8/18) with peak VO2 12.4, VE/VCO2 slope 35, RER 1.05 => severe HR limitation.  - Heartmate 3 LVAD 10/18.  2. CAD: Anterior MI 2005, had bifurcation stenting LAD/diagonal. Repeat cath 2010 with patent stents.  - NSTEMI 3/18: LHC with 99% ulcerated lesion proximal RCA, 95% mid LAD => PCI with DES to proximal RCA and mid LAD.  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  - Lower extremity venous dopplers (8/18): chronic DVT - V/Q scan (8/18): Normal, no PE.  4. Crohns disease: History of colectomy.  5. GERD 6. Diabetic gastroparesis: Admission 11/18 with abdominal pain, nausea/vomiting.  7. HTN 8. Hyperlipidemia 9. Type II diabetes  10. ESRD: Post-op LVAD surgery.  11. Carotid dopplers (8/18): mild bilateral stenosis.  12. PFTs (8/18): Mild restriction (no IDL on CT chest).  13. Lung nodules: CT chest 8/18 with small bilateral nodules.  14. PAD: ABIs with moderate bilateral disease in 8/18.  15. Atrial flutter: In 11/18 associated with nausea/vomiting  from gastroparesis.   Current Outpatient Medications  Medication Sig Dispense Refill  . amLODipine (NORVASC) 5 MG tablet Take 1 tablet (5 mg total) by mouth daily. On non dialysis days. 30 tablet 3  . aspirin EC 81 MG tablet Take 1 tablet (81 mg total) by mouth daily. 90 tablet 3  . atorvastatin (LIPITOR) 40 MG tablet Take 1 tablet (40 mg total) daily at 6 PM  by mouth. 30 tablet 6  . Continuous Blood Gluc Sensor (FREESTYLE LIBRE SENSOR SYSTEM) MISC Apply 1 reader to the skin every 10 days. 3 each 11  . docusate sodium (COLACE) 100 MG capsule Take 2 capsules (200 mg total) by mouth daily. 10 capsule 0  . dronabinol (MARINOL) 2.5 MG capsule Take 1 capsule (2.5 mg total) by mouth 2 (two) times daily before lunch and supper. 60 capsule 1  . erythromycin (E-MYCIN) 250 MG tablet Take 1 tablet (250 mg total) by mouth 3 (three) times daily with meals. 21 tablet 0  . glucose blood (FREESTYLE PRECISION NEO TEST) test strip Use as instructed 100 each 12  . hydrALAZINE (APRESOLINE) 50 MG tablet Take 1 tablet (50 mg total) by mouth 3 (three) times daily. 90 tablet 6  . insulin aspart (NOVOLOG) 100 UNIT/ML injection Inject 3 Units into the skin 3 (three) times daily with meals. (Patient taking differently: Inject 3-5 Units into the skin 3 (three) times daily with meals. ) 1 vial 0  . insulin glargine (LANTUS) 100 UNIT/ML injection Inject 0.05 mLs (5 Units total) into the skin daily. 10 mL 11  . LORazepam (ATIVAN) 0.5 MG tablet Take 1 tablet (0.5 mg total) by mouth every 12 (twelve) hours as needed for anxiety or sleep. 30 tablet 0  . metoCLOPramide (REGLAN) 5 MG tablet Take 1 tablet (5 mg total) by mouth 4 (four) times daily -  before meals and at bedtime. 130 tablet 6  . Nutritional Supplements (FEEDING SUPPLEMENT, NEPRO CARB STEADY,) LIQD Take 237 mLs by mouth 3 (three) times daily with meals. 90 Can 0  . ondansetron (ZOFRAN ODT) 4 MG disintegrating tablet Take 1 tablet (4 mg total) by mouth every 6 (six) hours as needed for nausea or vomiting. 40 tablet 1  . pantoprazole (PROTONIX) 40 MG tablet Take 40 mg by mouth daily.    . promethazine (PHENERGAN) 12.5 MG tablet Take 1 tablet (12.5 mg total) by mouth every 6 (six) hours as needed for nausea or vomiting. 30 tablet 0  . promethazine (PHENERGAN) 25 MG suppository Place 25 mg rectally every 6 (six) hours as needed for  nausea or vomiting.    . sildenafil (REVATIO) 20 MG tablet Take 1 tablet (20 mg total) by mouth 3 (three) times daily. 90 tablet 6  . warfarin (COUMADIN) 5 MG tablet Take 1 tablet (5 mg total) by mouth daily. Take as directed from coumadin clinic. See attached instruction. 30 tablet 0  . enoxaparin (LOVENOX) 80 MG/0.8ML injection Inject 0.75 mLs (75 mg total) into the skin every 12 (twelve) hours. 10 Syringe 1   No current facility-administered medications for this encounter.     Metformin and related   Review of systems complete and found to be negative unless listed in HPI.    Vital Signs Doppler Pressure 92 Automatc BP: 102/55 (70) HR: 77 SPO2: 96%  Weight: 183.2 lb w/o eqt.  Last weight: 183  VAD interrogation & Equipment Management: Speed: 3.9 Flow: 5600 Power: 4.2 w PI: 4.9  I reviewed the LVAD parameters from today, and  compared the results to the patient's prior recorded data.  No programming changes were made.  The LVAD is functioning within specified parameters.  The patient performs LVAD self-test daily.  LVAD interrogation was negative for any significant power changes, alarms or PI events/speed drops.  LVAD equipment check completed and is in good working order.  Back-up equipment present.   LVAD education done on emergency procedures and precautions and reviewed exit site care.   Physical Exam: GENERAL: Well appearing this am. NAD.  HEENT: Normal. NECK: Supple, JVP 5-6 cm. Carotids OK.  CARDIAC:  Mechanical heart sounds with LVAD hum present.  LUNGS:  CTAB, normal effort.  ABDOMEN:  NT, ND, no HSM. No bruits or masses. +BS  LVAD exit site: Well-healed and incorporated. Dressing dry and intact. No erythema or drainage. Stabilization device present and accurately applied. Driveline dressing changed daily per sterile technique. EXTREMITIES:  Warm and dry. No cyanosis, clubbing, rash, or edema.  NEUROLOGIC:  Alert & oriented x 3. Cranial nerves grossly intact.  Moves all 4 extremities w/o difficulty. Affect pleasant     ASSESSMENT AND PLAN: 1. Chronic systolic CHF: Ischemic cardiomyopathy.  St Jude ICD.  NSTEMI in March 2018 with DES to LAD and RCA, complicated by cardiogenic shock, low output requiring milrinone.  Echo 8/18 with EF 20-25%, moderate MR.  PX (8/18) with severe functional impairment due to HF.  He is now s/p HM3 LVAD placement. LVAD parameters reviewed today and stable.  No complaints. MAP remains in 90s.  Hemoglobin stable.  - NYHA II. Volumes status stable on exam.  - Continue amlodipine 5 mg daily.  - Continue ASA 81.  Continue warfarin INR 2-3 for now with no overt GI bleeding.  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 ESRD: Developed peri-operatively after HM3 placement.   - He is tolerating HD without problems currently. Dry weight recently adjusted.  3. CAD: NSTEMI in 3/18. LHC with 99% ulcerated lesion proximal RCA with left to right collaterals, 95% mid LAD stenosis after mid LAD stent. s/p PCI to RCA and LAD on 11/25/16.   - No s/s of ischemia.    - Continue statin and ASA. 4. h/o DVTs: Factor V Leiden heterozygote. - On coumadin. 5. Diabetic gastroparesis:  Uncontrolled. Continue Phenergan suppositories if nausea/vomiting returns.  - To complete course of erythromycin. - Continue reglan 5 mg tid/ac 6. DM:  - Stable.  7. HTN:  - MAPs stable today on current meds.  8. PAD: Moderate bilateral disease on ABIs in 8/18.  Denies claudication currently, no pedal ulcers.  9. Weight loss - In the setting N/V. Improved. Dry weight increased this week.  10: H/o AFL - Sinus tach recent admission.  Doing well overall. Awaiting visit at Gastroparesis specialist. States he was previously recommended for post pyloric feeding tube. Can discuss at [facility] next week.   Shirley Friar, PA-C  09/22/2017   Greater than 50% of the 35 minute visit was spent in counseling/coordination of care regarding disease state  education, salt/fluid restriction, sliding scale diuretics, and medication compliance.

## 2017-09-22 NOTE — Progress Notes (Signed)
Patient presents for hospital follow up nursing visit  in [facility] today. Reports no problems with VAD equipment or concerns with drive line. States he has had minimal nausea since discharge and has been able to take all medications with the exception of Wednesday evening's doses because he wasn't feeling well. Pt is disappointed that the earliest appointment he could get with the GI specialist at [facility] is in June. We have tried to expedite this appointment but with no success.  Pt is inquiring about Cardiac rehab today. Pt states that cardiac rehab is waiting for a response from Dr. Jimmy Footman before he can begin. I reached out to cardiac rehab and they have sent messages and letters to [place] with no response. They will attempt to reach him again regarding [name] and his desire to begin cardiac rehab.   Vital Signs:  Doppler Pressure 92 Automatc BP: 102/55 (70) HR: 77  SPO2:96  %  Weight: 183.2 lb w/o eqt Last weight: 183.8 lb  VAD interrogation & Equipment Management: Speed:3.9 Flow: 5600 Power:4.2 w    PI: 4.9  Alarms: several low voltage alarms Events: pt has had 80+ PI events on 1/17. This is an increase in PI events from before. Pt denies any dizziness or other symptoms associated with this change. In speaking with his dialysis center today, they felt that his dry weight was too low and that he was having too much fluid removed. They increased his dry weight on Wednesday. This would support why he had more PI events than usual. The increase in his dry weight will hopefully resolve his PI events.    Fixed speed 5600 Low speed limit: 5300  Annual Equipment Maintenance on UBC/PM was performed on 06/2017.   I reviewed the LVAD parameters from todayand compared the results to the patient's prior recorded data.LVAD interrogation was NEGATIVEfor significant power changes, NEGATIVEfor clinicalalarms and STABLEfor PI events/speed drops. No programming changes were madeand  pump is functioning within specified parameters. Pt is performing daily controller and system monitor self tests along with completing weekly and monthly maintenance for LVAD equipment.  LVAD equipment check completed and is in good working order. Back-up equipment present. Charged back up battery and performed self-test on equipment.   Exit Site Care: Drive line is being maintained weekly by daughter [name]. Exit site healed and incorporated, the velour is fully implanted at exit site. No redness, tenderness, drainage, foul odor or rash noted. Drive line anchor securely attached. Pt denies fever or chills. Pt was given 4 weekly dressings in clinic today.    Significant Events on VAD Support:  -10/2016>> poss drive line infection, CT ABD neg, ID consult-doxy -11/09/16>> admit for poss drive line infection, IV abx -03/24/17>> doxy for poss drive line infection -05/04/17>> drive line debridement with wound-vac -06/2017>> drive line +proteus, IV abx, Bactrim x14 days  Device:none  BP &Labs:  MAP 92- Doppler is reflecting MAP  Hgb 12.7- No S/S of bleeding. Specifically denies melena/BRBPR or nosebleeds.  LDH  274 - baseline of 245-350.  No power spikes or increase in flow.  Pt needed to complete [facility] today but was in a hurry to get to dialysis as they moved him up today. We will have him complete at his next appointment.   Plan: 1.Return to clinic at your regularly schedule appt in February.    [name] RN [place] Coordinator   Office: [phone] 24/7 Emergency VAD Pager: [phone]

## 2017-09-25 ENCOUNTER — Telehealth (HOSPITAL_COMMUNITY): Payer: Self-pay | Admitting: *Deleted

## 2017-09-25 ENCOUNTER — Other Ambulatory Visit (HOSPITAL_COMMUNITY): Payer: Self-pay

## 2017-09-25 NOTE — Telephone Encounter (Signed)
Called pt re: missed lab appointment today. He re-scheduled tomorrow at 10:00 am.

## 2017-09-26 ENCOUNTER — Ambulatory Visit (HOSPITAL_COMMUNITY): Payer: Self-pay | Admitting: Pharmacist

## 2017-09-26 ENCOUNTER — Ambulatory Visit (HOSPITAL_COMMUNITY)
Admission: RE | Admit: 2017-09-26 | Discharge: 2017-09-26 | Disposition: A | Discharge status: Home / Self Care | Payer: BLUE CROSS/BLUE SHIELD | Source: Ambulatory Visit | Attending: Cardiology | Admitting: Cardiology

## 2017-09-26 DIAGNOSIS — Z5181 Encounter for therapeutic drug level monitoring: Secondary | ICD-10-CM

## 2017-09-26 DIAGNOSIS — I82499 Acute embolism and thrombosis of other specified deep vein of unspecified lower extremity: Secondary | ICD-10-CM

## 2017-09-26 DIAGNOSIS — Z95811 Presence of heart assist device: Secondary | ICD-10-CM | POA: Diagnosis present

## 2017-09-26 LAB — PROTIME-INR
INR: 1.76
Prothrombin Time: 20.4 seconds — ABNORMAL HIGH (ref 11.4–15.2)

## 2017-09-26 LAB — LACTATE DEHYDROGENASE: LDH: 277 U/L — ABNORMAL HIGH (ref 98–192)

## 2017-09-26 NOTE — Addendum Note (Signed)
Addended by: Adora Fridge on: 09/26/2017 04:12 PM   Modules accepted: Orders

## 2017-09-28 NOTE — Telephone Encounter (Signed)
Spoke with [name] with Dr Alyson Ingles office. She has [name] an appointment 2/12/19at 9:30 am. She apologizes for the delay.  I notified [name] of the appointment date and time.

## 2017-09-28 NOTE — Telephone Encounter (Signed)
Thank you :)

## 2017-09-28 NOTE — Telephone Encounter (Signed)
Ok thank you 

## 2017-10-02 ENCOUNTER — Ambulatory Visit (INDEPENDENT_AMBULATORY_CARE_PROVIDER_SITE_OTHER): Payer: BLUE CROSS/BLUE SHIELD | Admitting: *Deleted

## 2017-10-02 DIAGNOSIS — I255 Ischemic cardiomyopathy: Secondary | ICD-10-CM | POA: Diagnosis not present

## 2017-10-02 NOTE — Progress Notes (Signed)
Remote ICD transmission.   

## 2017-10-03 ENCOUNTER — Ambulatory Visit (HOSPITAL_COMMUNITY)
Admission: RE | Admit: 2017-10-03 | Discharge: 2017-10-03 | Disposition: A | Discharge status: Home / Self Care | Payer: BLUE CROSS/BLUE SHIELD | Source: Ambulatory Visit | Attending: Cardiology | Admitting: Cardiology

## 2017-10-03 ENCOUNTER — Ambulatory Visit (HOSPITAL_COMMUNITY): Payer: Self-pay | Admitting: Pharmacist

## 2017-10-03 DIAGNOSIS — Z95811 Presence of heart assist device: Secondary | ICD-10-CM

## 2017-10-03 DIAGNOSIS — Z5181 Encounter for therapeutic drug level monitoring: Secondary | ICD-10-CM | POA: Diagnosis not present

## 2017-10-03 DIAGNOSIS — I82499 Acute embolism and thrombosis of other specified deep vein of unspecified lower extremity: Secondary | ICD-10-CM | POA: Diagnosis not present

## 2017-10-03 LAB — PROTIME-INR
INR: 2.96
PROTHROMBIN TIME: 30.6 s — AB (ref 11.4–15.2)

## 2017-10-04 ENCOUNTER — Encounter: Payer: Self-pay | Admitting: Cardiology

## 2017-10-04 ENCOUNTER — Other Ambulatory Visit (HOSPITAL_COMMUNITY): Payer: Self-pay

## 2017-10-05 LAB — CUP PACEART REMOTE DEVICE CHECK
Battery Voltage: 3.02 V
Brady Statistic AP VP Percent: 1 %
Brady Statistic AP VS Percent: 1 %
Brady Statistic AS VP Percent: 1 %
Brady Statistic RA Percent Paced: 1 %
Date Time Interrogation Session: 20190128105418
HighPow Impedance: 43 Ohm
HighPow Impedance: 43 Ohm
Implantable Lead Implant Date: 20100430
Implantable Lead Location: 753860
Implantable Lead Model: 7121
Lead Channel Impedance Value: 360 Ohm
Lead Channel Pacing Threshold Amplitude: 0.5 V
Lead Channel Pacing Threshold Amplitude: 0.75 V
Lead Channel Pacing Threshold Pulse Width: 0.5 ms
Lead Channel Sensing Intrinsic Amplitude: 1.6 mV
Lead Channel Sensing Intrinsic Amplitude: 12 mV
Lead Channel Setting Pacing Amplitude: 2.5 V
MDC IDC LEAD IMPLANT DT: 20100430
MDC IDC LEAD LOCATION: 753859
MDC IDC MSMT BATTERY REMAINING LONGEVITY: 78 mo
MDC IDC MSMT BATTERY REMAINING PERCENTAGE: 86 %
MDC IDC MSMT LEADCHNL RV IMPEDANCE VALUE: 380 Ohm
MDC IDC MSMT LEADCHNL RV PACING THRESHOLD PULSEWIDTH: 0.5 ms
MDC IDC PG IMPLANT DT: 20180110
MDC IDC PG SERIAL: 7383809
MDC IDC SET LEADCHNL RV PACING AMPLITUDE: 2.5 V
MDC IDC SET LEADCHNL RV PACING PULSEWIDTH: 0.5 ms
MDC IDC SET LEADCHNL RV SENSING SENSITIVITY: 0.5 mV
MDC IDC STAT BRADY AS VS PERCENT: 99 %
MDC IDC STAT BRADY RV PERCENT PACED: 1 %

## 2017-10-05 NOTE — H&P (View-Only) (Signed)
Postoperative Access Visit   History of Present Illness   [name] is a 52 y.o. year old male with LVAD who presents for postoperative follow-up for: right first stage brachial vein transposition (Date: 06/26/17).  The patient's wounds are healed.  The patient notes no steal symptoms.  The patient is able to complete their activities of daily living.  The patient's current symptoms are: none.   Past Medical History:  Diagnosis Date  . Angina   . ASCVD (arteriosclerotic cardiovascular disease)    , Anterior infarction 2005, LAD diagonal bifurcation intervention 03/2004  . Automatic implantable cardiac defibrillator -St. Jude's       . Benign neoplasm of colon   . CHF (congestive heart failure) ([place])   . Chronic systolic heart failure ([place])   . Coronary artery disease     Widely patent previously placed stents in the left anterior   . Crohn's disease ([place])   . 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 Dialysis patient ([place])   . Dyspnea   . Gastroparesis   . GERD (gastroesophageal reflux disease)   . High cholesterol   . Hyperlipidemia   . Hypersomnolent    Previous diagnosis of narcolepsy  . Hypertension, essential   . Ischemic cardiomyopathy    Ejection fraction 15-20% catheterization 2010  . Type II or unspecified type diabetes mellitus without mention of complication, not stated as uncontrolled   . Unspecified gastritis and gastroduodenitis without mention of hemorrhage     Past Surgical History:  Procedure Laterality Date  . APPENDECTOMY    . AV FISTULA PLACEMENT Right 06/26/2017   Procedure: ARTERIOVENOUS (AV) FISTULA CREATION VERSUS GRAFT RIGHT  ARM;  Surgeon: Conrad Gates, MD;  Location: [place];  Service: Vascular;  Laterality: Right;  . CARDIAC DEFIBRILLATOR PLACEMENT  2010   St. Jude ICD  . COLECTOMY  ~ 2003   "for Crohn's" ascending colon.    . CORONARY STENT INTERVENTION N/A 11/25/2016   Procedure: Coronary Stent Intervention;   Surgeon: Leonie Man, MD;  Location: [place] [facility];  Service: Cardiovascular;  Laterality: N/A;  . EP IMPLANTABLE DEVICE N/A 09/14/2016   Procedure: ICD Generator Changeout;  Surgeon: Deboraha Sprang, MD;  Location: [place] [facility];  Service: Cardiovascular;  Laterality: N/A;  . ESOPHAGOGASTRODUODENOSCOPY N/A 07/12/2017   Procedure: ESOPHAGOGASTRODUODENOSCOPY (EGD);  Surgeon: Jerene Bears, MD;  Location: [facility] ENDOSCOPY;  Service: Gastroenterology;  Laterality: N/A;  VAD pt so VAD team will need to accompany pt.   [name] FETAL SURGERY FOR CONGENITAL HERNIA     ????? pt has no knowledge of this..    . INGUINAL HERNIA REPAIR    . INSERTION OF DIALYSIS CATHETER Right 06/26/2017   Procedure: INSERTION OF TUNNELED  DIALYSIS CATHETER RIGHT INTERNAL JUGULAR;  Surgeon: Conrad [place], MD;  Location: [place];  Service: Vascular;  Laterality: Right;  . INSERTION OF IMPLANTABLE LEFT VENTRICULAR ASSIST DEVICE N/A 06/12/2017   Procedure: INSERTION OF IMPLANTABLE LEFT VENTRICULAR ASSIST [place] 3;  Surgeon: Ivin Poot, MD;  Location: [place];  Service: Open Heart Surgery;  Laterality: N/A;  HM3 LVAD  CIRC ARREST  NITRIC OXIDE  . IR THORACENTESIS ASP PLEURAL SPACE W/IMG GUIDE  06/28/2017  . MULTIPLE EXTRACTIONS WITH ALVEOLOPLASTY N/A 06/08/2017   Procedure: MULTIPLE EXTRACTION WITH ALVEOLOPLASTY AND PRE PROSTHETIC SURGERY AS NEEDED;  Surgeon: Lenn Cal, DDS;  Location: [place];  Service: Oral Surgery;  Laterality: N/A;  . RIGHT  HEART CATH N/A 06/07/2017   Procedure: RIGHT HEART CATH;  Surgeon: Larey Dresser, MD;  Location: [place] [facility];  Service: Cardiovascular;  Laterality: N/A;  . RIGHT/LEFT HEART CATH AND CORONARY ANGIOGRAPHY N/A 11/23/2016   Procedure: Right/Left Heart Cath and Coronary Angiography;  Surgeon: Larey Dresser, MD;  Location: [place] [facility];  Service: Cardiovascular;  Laterality: N/A;  . TEE WITHOUT CARDIOVERSION N/A 06/12/2017   Procedure: TRANSESOPHAGEAL  ECHOCARDIOGRAM (TEE);  Surgeon: Prescott Gum, Collier Salina, MD;  Location: [place];  Service: Open Heart Surgery;  Laterality: N/A;    Social History   Socioeconomic History  . Marital status: Married    Spouse name: Not on file  . Number of children: Not on file  . Years of education: Not on file  . Highest education level: Not on file  Social Needs  . Financial resource strain: Not on file  . Food insecurity - worry: Not on file  . Food insecurity - inability: Not on file  . Transportation needs - medical: Not on file  . Transportation needs - non-medical: Not on file  Occupational History  . Not on file  Tobacco Use  . Smoking status: Never Smoker  . Smokeless tobacco: Never Used  Substance and Sexual Activity  . Alcohol use: No  . Drug use: No  . Sexual activity: No  Other Topics Concern  . Not on file  Social History Narrative  . Not on file    Family History  Problem Relation Age of Onset  . Colon polyps Mother   . Diabetes Mother   . Heart attack Father   . Diabetes Father   . Stroke Paternal Grandfather   . Colitis Sister   . Heart disease Brother   . Colitis Sister   . Colon cancer Neg Hx     Current Outpatient Medications  Medication Sig Dispense Refill  . amLODipine (NORVASC) 5 MG tablet Take 1 tablet (5 mg total) by mouth daily. On non dialysis days. 30 tablet 3  . aspirin EC 81 MG tablet Take 1 tablet (81 mg total) by mouth daily. 90 tablet 3  . atorvastatin (LIPITOR) 40 MG tablet Take 1 tablet (40 mg total) daily at 6 PM by mouth. 30 tablet 6  . Continuous Blood Gluc Sensor (FREESTYLE LIBRE SENSOR SYSTEM) MISC Apply 1 reader to the skin every 10 days. 3 each 11  . docusate sodium (COLACE) 100 MG capsule Take 2 capsules (200 mg total) by mouth daily. 10 capsule 0  . dronabinol (MARINOL) 2.5 MG capsule Take 1 capsule (2.5 mg total) by mouth 2 (two) times daily before lunch and supper. 60 capsule 1  . erythromycin (E-MYCIN) 250 MG tablet Take 1 tablet (250 mg  total) by mouth 3 (three) times daily with meals. 21 tablet 0  . glucose blood (FREESTYLE PRECISION NEO TEST) test strip Use as instructed 100 each 12  . hydrALAZINE (APRESOLINE) 50 MG tablet Take 1 tablet (50 mg total) by mouth 3 (three) times daily. 90 tablet 6  . insulin aspart (NOVOLOG) 100 UNIT/ML injection Inject 3 Units into the skin 3 (three) times daily with meals. (Patient taking differently: Inject 3-5 Units into the skin 3 (three) times daily with meals. ) 1 vial 0  . insulin glargine (LANTUS) 100 UNIT/ML injection Inject 0.05 mLs (5 Units total) into the skin daily. 10 mL 11  . LORazepam (ATIVAN) 0.5 MG tablet Take 1 tablet (0.5 mg total) by mouth every 12 (twelve) hours as  needed for anxiety or sleep. 30 tablet 0  . metoCLOPramide (REGLAN) 5 MG tablet Take 1 tablet (5 mg total) by mouth 4 (four) times daily -  before meals and at bedtime. 130 tablet 6  . Nutritional Supplements (FEEDING SUPPLEMENT, NEPRO CARB STEADY,) LIQD Take 237 mLs by mouth 3 (three) times daily with meals. 90 Can 0  . ondansetron (ZOFRAN ODT) 4 MG disintegrating tablet Take 1 tablet (4 mg total) by mouth every 6 (six) hours as needed for nausea or vomiting. 40 tablet 1  . pantoprazole (PROTONIX) 40 MG tablet Take 40 mg by mouth daily.    . promethazine (PHENERGAN) 12.5 MG tablet Take 1 tablet (12.5 mg total) by mouth every 6 (six) hours as needed for nausea or vomiting. 30 tablet 0  . promethazine (PHENERGAN) 25 MG suppository Place 25 mg rectally every 6 (six) hours as needed for nausea or vomiting.    . sildenafil (REVATIO) 20 MG tablet Take 1 tablet (20 mg total) by mouth 3 (three) times daily. 90 tablet 6  . warfarin (COUMADIN) 5 MG tablet Take 5 mg by mouth daily.      No current facility-administered medications for this visit.      Allergies  Allergen Reactions  . Metformin And Related Diarrhea    REVIEW OF SYSTEMS (negative unless checked):   Cardiac:  []  Chest pain or chest pressure? []   Shortness of breath upon activity? []  Shortness of breath when lying flat? []  Irregular heart rhythm?  Vascular:  []  Pain in calf, thigh, or hip brought on by walking? []  Pain in feet at night that wakes you up from your sleep? []  Blood clot in your veins? []  Leg swelling?  Pulmonary:  []  Oxygen at home? []  Productive cough? []  Wheezing?  Neurologic:  []  Sudden weakness in arms or legs? []  Sudden numbness in arms or legs? []  Sudden onset of difficult speaking or slurred speech? []  Temporary loss of vision in one eye? []  Problems with dizziness?  Gastrointestinal:  []  Blood in stool? []  Vomited blood?  Genitourinary:  []  Burning when urinating? []  Blood in urine? [x]   End stage renal disease-HD: M/W/F  Psychiatric:  []  Major depression  Hematologic:  []  Bleeding problems? []  Problems with blood clotting?  Dermatologic:  []  Rashes or ulcers?  Constitutional:  []  Fever or chills?  Ear/Nose/Throat:  []  Change in hearing? []  Nose bleeds? []  Sore throat?  Musculoskeletal:  []  Back pain? []  Joint pain? []  Muscle pain?   Physical Examination   Vitals:   10/13/17 0838  BP: 124/60  Pulse: 64  Resp: 16  Temp: 97.6 F (36.4 C)  TempSrc: Oral  Weight: 171 lb (77.6 kg)  Height: 5' 7"  (1.702 m)   Pulmonary Sym exp, good air movt, CTAB, no rales, rhonchi, & wheezing  Cardiac RRR, Nl S1, S2, no Murmurs, rubs or gallops   right arm Incision is healed, skin feels warm, hand grip is 5/5, sensation in digits is intact, palpable thrill, bruit can be auscultated, On Sonosite: webbed distal brachial vein system with dilation of proximal brachial vein to 5-6 mm    Medical Decision Making   Roma E Sabic is a 52 y.o. year old male who presents s/p right first stage brachial vein transposition   Unfortunately, anatomically this patient's brachial vein system is not compatible with use as a fistula.  I recommend: ligation of right brachial vein transposition,  placement of right upper arm arteriovenous graft with Artegraft. Risk, benefits, and alternatives  to access surgery were discussed.   The patient is aware the risks include but are not limited to: bleeding, infection, steal syndrome, nerve damage, ischemic monomelic neuropathy, thrombosis, failure to mature, complications related to venous hypertension, need for additional procedures, death and stroke.   The patient agrees to proceed forward with the procedure.  This tenatively scheduled for 26 FEB 19 but might be moved up to 56 FEB 19 if another patient cancels.  Thank you for allowing [place] to participate in this patient's care.   Adele Barthel, MD, FACS [facility] Office: [phone] Pager: [phone]

## 2017-10-05 NOTE — Progress Notes (Signed)
Postoperative Access Visit   History of Present Illness   [name] is a 52 y.o. year old male with LVAD who presents for postoperative follow-up for: right first stage brachial vein transposition (Date: 06/26/17).  The patient's wounds are healed.  The patient notes no steal symptoms.  The patient is able to complete their activities of daily living.  The patient's current symptoms are: none.   Past Medical History:  Diagnosis Date  . Angina   . ASCVD (arteriosclerotic cardiovascular disease)    , Anterior infarction 2005, LAD diagonal bifurcation intervention 03/2004  . Automatic implantable cardiac defibrillator -St. Jude's       . Benign neoplasm of colon   . CHF (congestive heart failure) ([place])   . Chronic systolic heart failure ([place])   . Coronary artery disease     Widely patent previously placed stents in the left anterior   . Crohn's disease ([place])   . 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 Dialysis patient ([place])   . Dyspnea   . Gastroparesis   . GERD (gastroesophageal reflux disease)   . High cholesterol   . Hyperlipidemia   . Hypersomnolent    Previous diagnosis of narcolepsy  . Hypertension, essential   . Ischemic cardiomyopathy    Ejection fraction 15-20% catheterization 2010  . Type II or unspecified type diabetes mellitus without mention of complication, not stated as uncontrolled   . Unspecified gastritis and gastroduodenitis without mention of hemorrhage     Past Surgical History:  Procedure Laterality Date  . APPENDECTOMY    . AV FISTULA PLACEMENT Right 06/26/2017   Procedure: ARTERIOVENOUS (AV) FISTULA CREATION VERSUS GRAFT RIGHT  ARM;  Surgeon: Conrad Westervelt, MD;  Location: [facility];  Service: Vascular;  Laterality: Right;  . CARDIAC DEFIBRILLATOR PLACEMENT  2010   St. Jude ICD  . COLECTOMY  ~ 2003   "for Crohn's" ascending colon.    . CORONARY STENT INTERVENTION N/A 11/25/2016   Procedure: Coronary Stent Intervention;   Surgeon: Leonie Man, MD;  Location: [facility];  Service: Cardiovascular;  Laterality: N/A;  . EP IMPLANTABLE DEVICE N/A 09/14/2016   Procedure: ICD Generator Changeout;  Surgeon: Deboraha Sprang, MD;  Location: [place] [facility];  Service: Cardiovascular;  Laterality: N/A;  . ESOPHAGOGASTRODUODENOSCOPY N/A 07/12/2017   Procedure: ESOPHAGOGASTRODUODENOSCOPY (EGD);  Surgeon: Jerene Bears, MD;  Location: [facility] ENDOSCOPY;  Service: Gastroenterology;  Laterality: N/A;  VAD pt so VAD team will need to accompany pt.   [name] FETAL SURGERY FOR CONGENITAL HERNIA     ????? pt has no knowledge of this..    . INGUINAL HERNIA REPAIR    . INSERTION OF DIALYSIS CATHETER Right 06/26/2017   Procedure: INSERTION OF TUNNELED  DIALYSIS CATHETER RIGHT INTERNAL JUGULAR;  Surgeon: Conrad Hartwick, MD;  Location: [place];  Service: Vascular;  Laterality: Right;  . INSERTION OF IMPLANTABLE LEFT VENTRICULAR ASSIST DEVICE N/A 06/12/2017   Procedure: INSERTION OF IMPLANTABLE LEFT VENTRICULAR ASSIST [place] 3;  Surgeon: Ivin Poot, MD;  Location: [facility];  Service: Open Heart Surgery;  Laterality: N/A;  HM3 LVAD  CIRC ARREST  NITRIC OXIDE  . IR THORACENTESIS ASP PLEURAL SPACE W/IMG GUIDE  06/28/2017  . MULTIPLE EXTRACTIONS WITH ALVEOLOPLASTY N/A 06/08/2017   Procedure: MULTIPLE EXTRACTION WITH ALVEOLOPLASTY AND PRE PROSTHETIC SURGERY AS NEEDED;  Surgeon: Lenn Cal, DDS;  Location: [place];  Service: Oral Surgery;  Laterality: N/A;  . RIGHT  HEART CATH N/A 06/07/2017   Procedure: RIGHT HEART CATH;  Surgeon: Larey Dresser, MD;  Location: [place] [facility];  Service: Cardiovascular;  Laterality: N/A;  . RIGHT/LEFT HEART CATH AND CORONARY ANGIOGRAPHY N/A 11/23/2016   Procedure: Right/Left Heart Cath and Coronary Angiography;  Surgeon: Larey Dresser, MD;  Location: [facility] [facility];  Service: Cardiovascular;  Laterality: N/A;  . TEE WITHOUT CARDIOVERSION N/A 06/12/2017   Procedure: TRANSESOPHAGEAL  ECHOCARDIOGRAM (TEE);  Surgeon: Prescott Gum, Collier Salina, MD;  Location: [facility];  Service: Open Heart Surgery;  Laterality: N/A;    Social History   Socioeconomic History  . Marital status: Married    Spouse name: Not on file  . Number of children: Not on file  . Years of education: Not on file  . Highest education level: Not on file  Social Needs  . Financial resource strain: Not on file  . Food insecurity - worry: Not on file  . Food insecurity - inability: Not on file  . Transportation needs - medical: Not on file  . Transportation needs - non-medical: Not on file  Occupational History  . Not on file  Tobacco Use  . Smoking status: Never Smoker  . Smokeless tobacco: Never Used  Substance and Sexual Activity  . Alcohol use: No  . Drug use: No  . Sexual activity: No  Other Topics Concern  . Not on file  Social History Narrative  . Not on file    Family History  Problem Relation Age of Onset  . Colon polyps Mother   . Diabetes Mother   . Heart attack Father   . Diabetes Father   . Stroke Paternal Grandfather   . Colitis Sister   . Heart disease Brother   . Colitis Sister   . Colon cancer Neg Hx     Current Outpatient Medications  Medication Sig Dispense Refill  . amLODipine (NORVASC) 5 MG tablet Take 1 tablet (5 mg total) by mouth daily. On non dialysis days. 30 tablet 3  . aspirin EC 81 MG tablet Take 1 tablet (81 mg total) by mouth daily. 90 tablet 3  . atorvastatin (LIPITOR) 40 MG tablet Take 1 tablet (40 mg total) daily at 6 PM by mouth. 30 tablet 6  . Continuous Blood Gluc Sensor (FREESTYLE LIBRE SENSOR SYSTEM) MISC Apply 1 reader to the skin every 10 days. 3 each 11  . docusate sodium (COLACE) 100 MG capsule Take 2 capsules (200 mg total) by mouth daily. 10 capsule 0  . dronabinol (MARINOL) 2.5 MG capsule Take 1 capsule (2.5 mg total) by mouth 2 (two) times daily before lunch and supper. 60 capsule 1  . erythromycin (E-MYCIN) 250 MG tablet Take 1 tablet (250 mg  total) by mouth 3 (three) times daily with meals. 21 tablet 0  . glucose blood (FREESTYLE PRECISION NEO TEST) test strip Use as instructed 100 each 12  . hydrALAZINE (APRESOLINE) 50 MG tablet Take 1 tablet (50 mg total) by mouth 3 (three) times daily. 90 tablet 6  . insulin aspart (NOVOLOG) 100 UNIT/ML injection Inject 3 Units into the skin 3 (three) times daily with meals. (Patient taking differently: Inject 3-5 Units into the skin 3 (three) times daily with meals. ) 1 vial 0  . insulin glargine (LANTUS) 100 UNIT/ML injection Inject 0.05 mLs (5 Units total) into the skin daily. 10 mL 11  . LORazepam (ATIVAN) 0.5 MG tablet Take 1 tablet (0.5 mg total) by mouth every 12 (twelve) hours as  needed for anxiety or sleep. 30 tablet 0  . metoCLOPramide (REGLAN) 5 MG tablet Take 1 tablet (5 mg total) by mouth 4 (four) times daily -  before meals and at bedtime. 130 tablet 6  . Nutritional Supplements (FEEDING SUPPLEMENT, NEPRO CARB STEADY,) LIQD Take 237 mLs by mouth 3 (three) times daily with meals. 90 Can 0  . ondansetron (ZOFRAN ODT) 4 MG disintegrating tablet Take 1 tablet (4 mg total) by mouth every 6 (six) hours as needed for nausea or vomiting. 40 tablet 1  . pantoprazole (PROTONIX) 40 MG tablet Take 40 mg by mouth daily.    . promethazine (PHENERGAN) 12.5 MG tablet Take 1 tablet (12.5 mg total) by mouth every 6 (six) hours as needed for nausea or vomiting. 30 tablet 0  . promethazine (PHENERGAN) 25 MG suppository Place 25 mg rectally every 6 (six) hours as needed for nausea or vomiting.    . sildenafil (REVATIO) 20 MG tablet Take 1 tablet (20 mg total) by mouth 3 (three) times daily. 90 tablet 6  . warfarin (COUMADIN) 5 MG tablet Take 5 mg by mouth daily.      No current facility-administered medications for this visit.      Allergies  Allergen Reactions  . Metformin And Related Diarrhea    REVIEW OF SYSTEMS (negative unless checked):   Cardiac:  []  Chest pain or chest pressure? []   Shortness of breath upon activity? []  Shortness of breath when lying flat? []  Irregular heart rhythm?  Vascular:  []  Pain in calf, thigh, or hip brought on by walking? []  Pain in feet at night that wakes you up from your sleep? []  Blood clot in your veins? []  Leg swelling?  Pulmonary:  []  Oxygen at home? []  Productive cough? []  Wheezing?  Neurologic:  []  Sudden weakness in arms or legs? []  Sudden numbness in arms or legs? []  Sudden onset of difficult speaking or slurred speech? []  Temporary loss of vision in one eye? []  Problems with dizziness?  Gastrointestinal:  []  Blood in stool? []  Vomited blood?  Genitourinary:  []  Burning when urinating? []  Blood in urine? [x]   End stage renal disease-HD: M/W/F  Psychiatric:  []  Major depression  Hematologic:  []  Bleeding problems? []  Problems with blood clotting?  Dermatologic:  []  Rashes or ulcers?  Constitutional:  []  Fever or chills?  Ear/Nose/Throat:  []  Change in hearing? []  Nose bleeds? []  Sore throat?  Musculoskeletal:  []  Back pain? []  Joint pain? []  Muscle pain?   Physical Examination   Vitals:   10/13/17 0838  BP: 124/60  Pulse: 64  Resp: 16  Temp: 97.6 F (36.4 C)  TempSrc: Oral  Weight: 171 lb (77.6 kg)  Height: 5' 7"  (1.702 m)   Pulmonary Sym exp, good air movt, CTAB, no rales, rhonchi, & wheezing  Cardiac RRR, Nl S1, S2, no Murmurs, rubs or gallops   right arm Incision is healed, skin feels warm, hand grip is 5/5, sensation in digits is intact, palpable thrill, bruit can be auscultated, On Sonosite: webbed distal brachial vein system with dilation of proximal brachial vein to 5-6 mm    Medical Decision Making   Moo E Sandlin is a 52 y.o. year old male who presents s/p right first stage brachial vein transposition   Unfortunately, anatomically this patient's brachial vein system is not compatible with use as a fistula.  I recommend: ligation of right brachial vein transposition,  placement of right upper arm arteriovenous graft with Artegraft. Risk, benefits, and alternatives  to access surgery were discussed.   The patient is aware the risks include but are not limited to: bleeding, infection, steal syndrome, nerve damage, ischemic monomelic neuropathy, thrombosis, failure to mature, complications related to venous hypertension, need for additional procedures, death and stroke.   The patient agrees to proceed forward with the procedure.  This tenatively scheduled for 26 FEB 19 but might be moved up to 43 FEB 19 if another patient cancels.  Thank you for allowing [place] to participate in this patient's care.   Adele Barthel, MD, FACS [facility] Office: [phone] Pager: [phone]

## 2017-10-10 ENCOUNTER — Ambulatory Visit (HOSPITAL_COMMUNITY): Payer: Self-pay | Admitting: Pharmacist

## 2017-10-10 ENCOUNTER — Ambulatory Visit (HOSPITAL_COMMUNITY)
Admission: RE | Admit: 2017-10-10 | Discharge: 2017-10-10 | Disposition: A | Discharge status: Home / Self Care | Payer: BLUE CROSS/BLUE SHIELD | Source: Ambulatory Visit | Attending: Cardiology | Admitting: Cardiology

## 2017-10-10 DIAGNOSIS — Z5181 Encounter for therapeutic drug level monitoring: Secondary | ICD-10-CM

## 2017-10-10 DIAGNOSIS — Z95811 Presence of heart assist device: Secondary | ICD-10-CM | POA: Diagnosis not present

## 2017-10-10 DIAGNOSIS — I82499 Acute embolism and thrombosis of other specified deep vein of unspecified lower extremity: Secondary | ICD-10-CM | POA: Diagnosis not present

## 2017-10-10 LAB — PROTIME-INR
INR: 3.86
Prothrombin Time: 37.6 seconds — ABNORMAL HIGH (ref 11.4–15.2)

## 2017-10-13 ENCOUNTER — Encounter: Payer: Self-pay | Admitting: Vascular Surgery

## 2017-10-13 ENCOUNTER — Ambulatory Visit (INDEPENDENT_AMBULATORY_CARE_PROVIDER_SITE_OTHER): Payer: BLUE CROSS/BLUE SHIELD | Admitting: Vascular Surgery

## 2017-10-13 ENCOUNTER — Other Ambulatory Visit: Payer: Self-pay | Admitting: *Deleted

## 2017-10-13 VITALS — BP 124/60 | HR 64 | Temp 97.6°F | Resp 16 | Ht 67.0 in | Wt 171.0 lb

## 2017-10-13 DIAGNOSIS — N179 Acute kidney failure, unspecified: Secondary | ICD-10-CM

## 2017-10-13 NOTE — Progress Notes (Signed)
Call to patient instructed to report to admitting department at [facility] hospital at 5:30 am on 10/31/17 for surgery. Will need to hold Coumadin for 5 days pre-op and expect call from Dr. Claris Gladden office regarding possible Lovenox injections. NPO past MN night prior. To follow the detailed instructions received from the hospital Pre-Admission department about this surgery. Will need driver home. Verbalized understanding.

## 2017-10-16 ENCOUNTER — Encounter (HOSPITAL_COMMUNITY): Payer: Self-pay | Admitting: Emergency Medicine

## 2017-10-16 ENCOUNTER — Emergency Department (HOSPITAL_COMMUNITY)
Admission: EM | Admit: 2017-10-16 | Discharge: 2017-10-16 | Disposition: A | Discharge status: Home / Self Care | Payer: BLUE CROSS/BLUE SHIELD | Attending: Emergency Medicine | Admitting: Emergency Medicine

## 2017-10-16 DIAGNOSIS — Z794 Long term (current) use of insulin: Secondary | ICD-10-CM | POA: Diagnosis not present

## 2017-10-16 DIAGNOSIS — E1122 Type 2 diabetes mellitus with diabetic chronic kidney disease: Secondary | ICD-10-CM | POA: Diagnosis not present

## 2017-10-16 DIAGNOSIS — M6281 Muscle weakness (generalized): Secondary | ICD-10-CM | POA: Insufficient documentation

## 2017-10-16 DIAGNOSIS — R42 Dizziness and giddiness: Secondary | ICD-10-CM | POA: Diagnosis not present

## 2017-10-16 DIAGNOSIS — N186 End stage renal disease: Secondary | ICD-10-CM | POA: Diagnosis not present

## 2017-10-16 DIAGNOSIS — I132 Hypertensive heart and chronic kidney disease with heart failure and with stage 5 chronic kidney disease, or end stage renal disease: Secondary | ICD-10-CM | POA: Diagnosis not present

## 2017-10-16 DIAGNOSIS — Z7901 Long term (current) use of anticoagulants: Secondary | ICD-10-CM | POA: Diagnosis not present

## 2017-10-16 DIAGNOSIS — I5022 Chronic systolic (congestive) heart failure: Secondary | ICD-10-CM | POA: Diagnosis not present

## 2017-10-16 DIAGNOSIS — R531 Weakness: Secondary | ICD-10-CM

## 2017-10-16 DIAGNOSIS — Z992 Dependence on renal dialysis: Secondary | ICD-10-CM | POA: Diagnosis not present

## 2017-10-16 DIAGNOSIS — I2510 Atherosclerotic heart disease of native coronary artery without angina pectoris: Secondary | ICD-10-CM | POA: Insufficient documentation

## 2017-10-16 DIAGNOSIS — R112 Nausea with vomiting, unspecified: Secondary | ICD-10-CM | POA: Diagnosis not present

## 2017-10-16 DIAGNOSIS — Z7982 Long term (current) use of aspirin: Secondary | ICD-10-CM | POA: Insufficient documentation

## 2017-10-16 LAB — CBC
HCT: 39.6 % (ref 39.0–52.0)
Hemoglobin: 12.7 g/dL — ABNORMAL LOW (ref 13.0–17.0)
MCH: 27.2 pg (ref 26.0–34.0)
MCHC: 32.1 g/dL (ref 30.0–36.0)
MCV: 84.8 fL (ref 78.0–100.0)
Platelets: 202 10*3/uL (ref 150–400)
RBC: 4.67 MIL/uL (ref 4.22–5.81)
RDW: 18.9 % — AB (ref 11.5–15.5)
WBC: 8.6 10*3/uL (ref 4.0–10.5)

## 2017-10-16 LAB — BASIC METABOLIC PANEL
Anion gap: 13 (ref 5–15)
BUN: 19 mg/dL (ref 6–20)
CALCIUM: 7.8 mg/dL — AB (ref 8.9–10.3)
CO2: 23 mmol/L (ref 22–32)
CREATININE: 3.82 mg/dL — AB (ref 0.61–1.24)
Chloride: 99 mmol/L — ABNORMAL LOW (ref 101–111)
GFR calc Af Amer: 19 mL/min — ABNORMAL LOW (ref >60)
GFR calc non Af Amer: 17 mL/min — ABNORMAL LOW (ref >60)
GLUCOSE: 149 mg/dL — AB (ref 65–99)
POTASSIUM: 3.6 mmol/L (ref 3.5–5.1)
SODIUM: 135 mmol/L (ref 135–145)

## 2017-10-16 NOTE — ED Provider Notes (Signed)
[facility] EMERGENCY DEPARTMENT Provider Note   CSN: [id number] Arrival date & time: 10/16/17  [place]     History   Chief Complaint Chief Complaint  Patient presents with  . Weakness    HPI [name] is a 52 y.o. male.  Patient with LVAD, ESRD on HD, c/o feeling lightheaded, when nausea, and vomiting, early today after dialysis. Had normal HD today. Denies fever or chills. Denies any recent blood loss. No rectal bleeding or melena. Denies problems or alarms with his LVAD.   EMS gave 250 cc ns bolus, and pt reports feeling improved.  Pt denies change in meds or new meds. States felt normal, at baseline, prior to dialysis.    The history is provided by the patient.  Weakness  Pertinent negatives include no shortness of breath, no chest pain, no confusion and no headaches.    Past Medical History:  Diagnosis Date  . Angina   . ASCVD (arteriosclerotic cardiovascular disease)    , Anterior infarction 2005, LAD diagonal bifurcation intervention 03/2004  . Automatic implantable cardiac defibrillator -St. Jude's       . Benign neoplasm of colon   . CHF (congestive heart failure) ([place])   . Chronic systolic heart failure ([place])   . Coronary artery disease     Widely patent previously placed stents in the left anterior   . Crohn's disease ([place])   . 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 Dialysis patient ([place])   . Dyspnea   . Gastroparesis   . GERD (gastroesophageal reflux disease)   . High cholesterol   . Hyperlipidemia   . Hypersomnolent    Previous diagnosis of narcolepsy  . Hypertension, essential   . Ischemic cardiomyopathy    Ejection fraction 15-20% catheterization 2010  . Type II or unspecified type diabetes mellitus without mention of complication, not stated as uncontrolled   . Unspecified gastritis and gastroduodenitis without mention of hemorrhage     Patient Active Problem List   Diagnosis Date Noted  . Nausea with  vomiting 09/12/2017  . Intractable nausea and vomiting 07/31/2017  . 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) ([place]) 07/10/2017  . LVAD (left ventricular assist device) present ([place])   . Palliative care by specialist   . Chronic systolic (congestive) heart failure ([place]) 06/07/2017  . ACP (advance care planning)   . Goals of care, counseling/discussion   . Diarrhea   . Palliative care encounter   . Nausea   . CHF exacerbation ([place]) 04/05/2017  . Diabetic keto-acidosis ([place]) 04/05/2017  . Type 2 diabetes mellitus with complication, with long-term current use of insulin ([place]) 03/08/2017  . Acute on chronic combined systolic and diastolic CHF (congestive heart failure) ([place]) 02/08/2017  . CKD (chronic kidney disease), stage III ([place]) 01/17/2017  . AKI (acute kidney injury) ([place])   . Non-ST elevation (NSTEMI) myocardial infarction ([place])   . CHF (congestive heart failure) ([place]) 09/02/2016  . Elevated serum creatinine   . Encounter for therapeutic drug monitoring 08/03/2016  . Chest pain 04/26/2012  . SVT (supraventricular tachycardia) ([place]) 04/26/2012  . Diabetic gastroparesis ([place]) 11/18/2011  . Nausea and vomiting in adult 11/17/2011  . Coronary artery disease   . Ischemic cardiomyopathy   . Essential hypertension   . Automatic implantable cardioverter-defibrillator in situ   . Deep venous thrombosis ([place])   . Hypersomnolent   . POLYP, COLON 09/25/2008  . Type 2 diabetes mellitus ([place]) 09/25/2008  . Dyslipidemia, goal  LDL below 70 09/25/2008  . GASTRITIS 09/25/2008  . DIVERTICULOSIS, COLON 09/25/2008  . RECTAL MASS 09/25/2008    Past Surgical History:  Procedure Laterality Date  . APPENDECTOMY    . AV FISTULA PLACEMENT Right 06/26/2017   Procedure: ARTERIOVENOUS (AV) FISTULA CREATION VERSUS GRAFT RIGHT  ARM;  Surgeon: Conrad Buckner, MD;  Location: [place];  Service: Vascular;  Laterality: Right;  . CARDIAC DEFIBRILLATOR PLACEMENT  2010   St. Jude ICD  . COLECTOMY  ~  2003   "for Crohn's" ascending colon.    . CORONARY STENT INTERVENTION N/A 11/25/2016   Procedure: Coronary Stent Intervention;  Surgeon: Leonie Man, MD;  Location: [place] [facility];  Service: Cardiovascular;  Laterality: N/A;  . EP IMPLANTABLE DEVICE N/A 09/14/2016   Procedure: ICD Generator Changeout;  Surgeon: Deboraha Sprang, MD;  Location: [place] [facility];  Service: Cardiovascular;  Laterality: N/A;  . ESOPHAGOGASTRODUODENOSCOPY N/A 07/12/2017   Procedure: ESOPHAGOGASTRODUODENOSCOPY (EGD);  Surgeon: Jerene Bears, MD;  Location: [facility] ENDOSCOPY;  Service: Gastroenterology;  Laterality: N/A;  VAD pt so VAD team will need to accompany pt.   [name] FETAL SURGERY FOR CONGENITAL HERNIA     ????? pt has no knowledge of this..    . INGUINAL HERNIA REPAIR    . INSERTION OF DIALYSIS CATHETER Right 06/26/2017   Procedure: INSERTION OF TUNNELED  DIALYSIS CATHETER RIGHT INTERNAL JUGULAR;  Surgeon: Conrad [place], MD;  Location: [facility];  Service: Vascular;  Laterality: Right;  . INSERTION OF IMPLANTABLE LEFT VENTRICULAR ASSIST DEVICE N/A 06/12/2017   Procedure: INSERTION OF IMPLANTABLE LEFT VENTRICULAR ASSIST [place] 3;  Surgeon: Ivin Poot, MD;  Location: [facility] [place];  Service: Open Heart Surgery;  Laterality: N/A;  HM3 LVAD  CIRC ARREST  NITRIC OXIDE  . IR THORACENTESIS ASP PLEURAL SPACE W/IMG GUIDE  06/28/2017  . MULTIPLE EXTRACTIONS WITH ALVEOLOPLASTY N/A 06/08/2017   Procedure: MULTIPLE EXTRACTION WITH ALVEOLOPLASTY AND PRE PROSTHETIC SURGERY AS NEEDED;  Surgeon: Lenn Cal, DDS;  Location: [facility];  Service: Oral Surgery;  Laterality: N/A;  . RIGHT HEART CATH N/A 06/07/2017   Procedure: RIGHT HEART CATH;  Surgeon: Larey Dresser, MD;  Location: [facility];  Service: Cardiovascular;  Laterality: N/A;  . RIGHT/LEFT HEART CATH AND CORONARY ANGIOGRAPHY N/A 11/23/2016   Procedure: Right/Left Heart Cath and Coronary Angiography;  Surgeon: Larey Dresser, MD;  Location: [facility]  CV LAB;  Service: Cardiovascular;  Laterality: N/A;  . TEE WITHOUT CARDIOVERSION N/A 06/12/2017   Procedure: TRANSESOPHAGEAL ECHOCARDIOGRAM (TEE);  Surgeon: Prescott Gum, Collier Salina, MD;  Location: [facility];  Service: Open Heart Surgery;  Laterality: N/A;       Home Medications    Prior to Admission medications   Medication Sig Start Date End Date Taking? Authorizing Provider  amLODipine (NORVASC) 5 MG tablet Take 1 tablet (5 mg total) by mouth daily. On non dialysis days. 09/17/17   Daune Perch, NP  aspirin EC 81 MG tablet Take 1 tablet (81 mg total) by mouth daily. 07/26/17   Larey Dresser, MD  atorvastatin (LIPITOR) 40 MG tablet Take 1 tablet (40 mg total) daily at 6 PM by mouth. 07/16/17   Bhagat, Bhavinkumar, PA  Continuous Blood Gluc Sensor ([place]) MISC Apply 1 reader to the skin every 10 days. 08/17/17   Zenia Resides, MD  docusate sodium (COLACE) 100 MG capsule Take 2 capsules (200 mg total) by mouth daily. 07/03/17   Clegg, Amy  D, NP  dronabinol (MARINOL) 2.5 MG capsule Take 1 capsule (2.5 mg total) by mouth 2 (two) times daily before lunch and supper. 09/16/17   Daune Perch, NP  erythromycin (E-MYCIN) 250 MG tablet Take 1 tablet (250 mg total) by mouth 3 (three) times daily with meals. 09/16/17   Daune Perch, NP  glucose blood (FREESTYLE PRECISION NEO TEST) test strip Use as instructed 08/17/17   Zenia Resides, MD  hydrALAZINE (APRESOLINE) 50 MG tablet Take 1 tablet (50 mg total) by mouth 3 (three) times daily. 08/08/17   Larey Dresser, MD  insulin aspart (NOVOLOG) 100 UNIT/ML injection Inject 3 Units into the skin 3 (three) times daily with meals. Patient taking differently: Inject 3-5 Units into the skin 3 (three) times daily with meals.  07/03/17   Clegg, Amy D, NP  insulin glargine (LANTUS) 100 UNIT/ML injection Inject 0.05 mLs (5 Units total) into the skin daily. 07/03/17   Clegg, Amy D, NP  LORazepam (ATIVAN) 0.5 MG tablet Take 1 tablet (0.5  mg total) by mouth every 12 (twelve) hours as needed for anxiety or sleep. 08/04/17   Clegg, Amy D, NP  metoCLOPramide (REGLAN) 5 MG tablet Take 1 tablet (5 mg total) by mouth 4 (four) times daily -  before meals and at bedtime. 08/04/17   Clegg, Amy D, NP  Nutritional Supplements (FEEDING SUPPLEMENT, NEPRO CARB STEADY,) LIQD Take 237 mLs by mouth 3 (three) times daily with meals. 09/16/17   Daune Perch, NP  ondansetron (ZOFRAN ODT) 4 MG disintegrating tablet Take 1 tablet (4 mg total) by mouth every 6 (six) hours as needed for nausea or vomiting. 01/10/17   [place], Amy S, PA-C  pantoprazole (PROTONIX) 40 MG tablet Take 40 mg by mouth daily.    [provider]  promethazine (PHENERGAN) 12.5 MG tablet Take 1 tablet (12.5 mg total) by mouth every 6 (six) hours as needed for nausea or vomiting. 09/16/17   Daune Perch, NP  promethazine (PHENERGAN) 25 MG suppository Place 25 mg rectally every 6 (six) hours as needed for nausea or vomiting.    [provider]  sildenafil (REVATIO) 20 MG tablet Take 1 tablet (20 mg total) by mouth 3 (three) times daily. 07/26/17   Larey Dresser, MD  warfarin (COUMADIN) 5 MG tablet Take 5 mg by mouth daily.     [provider]    Family History Family History  Problem Relation Age of Onset  . Colon polyps Mother   . Diabetes Mother   . Heart attack Father   . Diabetes Father   . Stroke Paternal Grandfather   . Colitis Sister   . Heart disease Brother   . Colitis Sister   . Colon cancer Neg Hx     Social History Social History   Tobacco Use  . Smoking status: Never Smoker  . Smokeless tobacco: Never Used  Substance Use Topics  . Alcohol use: No  . Drug use: No     Allergies   Metformin and related   Review of Systems Review of Systems  Constitutional: Negative for chills and fever.  HENT: Negative for sore throat.   Eyes: Negative for redness.  Respiratory: Negative for shortness of breath.   Cardiovascular:  Negative for chest pain.  Gastrointestinal: Positive for nausea. Negative for abdominal pain, blood in stool and diarrhea.  Genitourinary: Negative for dysuria and flank pain.       States makes very little urine at baseline, no recent change.  Musculoskeletal: Negative for back pain.  Skin: Negative for rash.  Neurological: Positive for weakness. Negative for headaches.  Hematological: Does not bruise/bleed easily.  Psychiatric/Behavioral: Negative for confusion.     Physical Exam Updated Vital Signs Ht 1.702 m (5' 7" )   Wt 77.6 kg (171 lb)   SpO2 98%   BMI 26.78 kg/m   Physical Exam  Constitutional: He appears well-developed and well-nourished. No distress.  HENT:  Mouth/Throat: Oropharynx is clear and moist.  Eyes: Conjunctivae are normal.  Neck: Neck supple. No tracheal deviation present.  Cardiovascular:  Hum from LVAD present.   Pulmonary/Chest: Effort normal and breath sounds normal. No accessory muscle usage. No respiratory distress.  HD cath without sign of infection.   Abdominal: Soft. He exhibits no distension. There is no tenderness.  Genitourinary:  Genitourinary Comments: No cva tenderness  Musculoskeletal: He exhibits no edema.  Neurological: He is alert.  Skin: Skin is warm and dry. He is not diaphoretic.  Psychiatric: He has a normal mood and affect.  Nursing note and vitals reviewed.    ED Treatments / Results  Labs (all labs ordered are listed, but only abnormal results are displayed) Results for orders placed or performed during the hospital encounter of 10/16/17  CBC  Result Value Ref Range   WBC 8.6 4.0 - 10.5 K/uL   RBC 4.67 4.22 - 5.81 MIL/uL   Hemoglobin 12.7 (L) 13.0 - 17.0 g/dL   HCT 39.6 39.0 - 52.0 %   MCV 84.8 78.0 - 100.0 fL   MCH 27.2 26.0 - 34.0 pg   MCHC 32.1 30.0 - 36.0 g/dL   RDW 18.9 (H) 11.5 - 15.5 %   Platelets 202 150 - 400 K/uL  Basic metabolic panel  Result Value Ref Range   Sodium 135 135 - 145 mmol/L   Potassium  3.6 3.5 - 5.1 mmol/L   Chloride 99 (L) 101 - 111 mmol/L   CO2 23 22 - 32 mmol/L   Glucose, Bld 149 (H) 65 - 99 mg/dL   BUN 19 6 - 20 mg/dL   Creatinine, Ser 3.82 (H) 0.61 - 1.24 mg/dL   Calcium 7.8 (L) 8.9 - 10.3 mg/dL   GFR calc non Af Amer 17 (L) >60 mL/min   GFR calc Af Amer 19 (L) >60 mL/min   Anion gap 13 5 - 15    EKG  EKG Interpretation None       Radiology No results found.  Procedures Procedures (including critical care time)  Medications Ordered in ED Medications - No data to display   Initial Impression / Assessment and Plan / ED Course  I have reviewed the triage vital signs and the nursing notes.  Pertinent labs & imaging results that were available during my care of the patient were reviewed by me and considered in my medical decision making (see chart for details).  Iv ns. Labs.   Reviewed nursing notes and prior charts for additional history.   LVAD team evaluated in ED - they indicate earlier, prior to fluids, had low flow noted, but none since - they indicate LVAD device appears to be functioning normally.    Recheck pt has eaten/drank, states feels fine, at baseline.  Pt currently appears stable for d/c.     Final Clinical Impressions(s) / ED Diagnoses   Final diagnoses:  None    ED Discharge Orders    None       Lajean Saver, MD 10/16/17 2014

## 2017-10-16 NOTE — ED Triage Notes (Signed)
Per EMS pt from home was at dialysis and completed, 3.2 kilos taken, pt went to walk to scale c/o lightheaded became N and vomited twice, felt weak and dizzy. LVAD no setting alarms went off, 109/89- 144/102, 86 HR, lungs clear, 9m zofran given,CP earlier having intermittent pain throughout the day.

## 2017-10-16 NOTE — ED Notes (Signed)
Pt given ginger ale and crackers

## 2017-10-16 NOTE — Discharge Instructions (Signed)
It was our pleasure to provide your ER care today - we hope that you feel better.  Follow up with your doctor tomorrow as planned.  Return to ER if worse, new symptoms, fevers, trouble breathing, other concern.

## 2017-10-17 ENCOUNTER — Encounter (HOSPITAL_COMMUNITY): Payer: Self-pay

## 2017-10-17 ENCOUNTER — Ambulatory Visit (HOSPITAL_COMMUNITY)
Admission: RE | Admit: 2017-10-17 | Discharge: 2017-10-17 | Disposition: A | Discharge status: Home / Self Care | Payer: BLUE CROSS/BLUE SHIELD | Source: Ambulatory Visit | Attending: Cardiology | Admitting: Cardiology

## 2017-10-17 VITALS — BP 114/0

## 2017-10-17 DIAGNOSIS — Z7689 Persons encountering health services in other specified circumstances: Secondary | ICD-10-CM | POA: Insufficient documentation

## 2017-10-17 DIAGNOSIS — N186 End stage renal disease: Secondary | ICD-10-CM | POA: Insufficient documentation

## 2017-10-17 DIAGNOSIS — R531 Weakness: Secondary | ICD-10-CM | POA: Diagnosis not present

## 2017-10-17 DIAGNOSIS — E861 Hypovolemia: Secondary | ICD-10-CM | POA: Diagnosis not present

## 2017-10-17 DIAGNOSIS — Z992 Dependence on renal dialysis: Secondary | ICD-10-CM | POA: Insufficient documentation

## 2017-10-17 DIAGNOSIS — Z95811 Presence of heart assist device: Secondary | ICD-10-CM

## 2017-10-17 NOTE — Progress Notes (Signed)
Pt reports for a nurse visit today after being in the ED last evening for hypovolemia. Pt was given 500cc bolus and feels much better-labs were normal.   VAD interrogation & Equipment Management: Speed:3.8 Flow: 5600 Power:4.3 w PI: 6.5  Pt had no alarms and 3-5 PI events since yesterday. Pt had not had any more low flow alarms since his visit to the ED last night. BP was high at 124/104)109); Doppler: 114, but pt states that he has not taken any of his medications today. Pt instructed to take meds as soon as he gets home.   Pt states that he is feeling much better today. He had his consultation with Dr. Derrill Kay this morning over at [facility]. He states that Dr. Derrill Kay will be running some more tests. I will notify their VAD team that Mr. Blazejewski will be visiting there for some outpatient tests.   We were unable to get an INR today here in clinic. I have faxed orders over to [facility] for pt to have an INR there. We will not get results until Thursday or Friday.  [name] RN, BSN VAD Coordinator 24/7 Pager [phone]

## 2017-10-18 LAB — PROTIME-INR: INR: 2.4 — AB (ref <=1.1)

## 2017-10-20 ENCOUNTER — Ambulatory Visit (HOSPITAL_COMMUNITY): Payer: Self-pay | Admitting: Pharmacist

## 2017-10-20 DIAGNOSIS — I82499 Acute embolism and thrombosis of other specified deep vein of unspecified lower extremity: Secondary | ICD-10-CM

## 2017-10-20 DIAGNOSIS — Z5181 Encounter for therapeutic drug level monitoring: Secondary | ICD-10-CM

## 2017-10-24 ENCOUNTER — Encounter (HOSPITAL_COMMUNITY): Payer: Self-pay

## 2017-10-24 ENCOUNTER — Other Ambulatory Visit (HOSPITAL_COMMUNITY): Payer: Self-pay | Admitting: *Deleted

## 2017-10-24 ENCOUNTER — Ambulatory Visit (HOSPITAL_COMMUNITY): Payer: Self-pay | Admitting: Pharmacist

## 2017-10-24 ENCOUNTER — Ambulatory Visit (HOSPITAL_COMMUNITY)
Admission: RE | Admit: 2017-10-24 | Discharge: 2017-10-24 | Disposition: A | Discharge status: Home / Self Care | Payer: BLUE CROSS/BLUE SHIELD | Source: Ambulatory Visit | Attending: Cardiology | Admitting: Cardiology

## 2017-10-24 ENCOUNTER — Telehealth: Payer: Self-pay | Admitting: *Deleted

## 2017-10-24 DIAGNOSIS — I252 Old myocardial infarction: Secondary | ICD-10-CM | POA: Diagnosis not present

## 2017-10-24 DIAGNOSIS — I255 Ischemic cardiomyopathy: Secondary | ICD-10-CM | POA: Diagnosis not present

## 2017-10-24 DIAGNOSIS — Z794 Long term (current) use of insulin: Secondary | ICD-10-CM | POA: Diagnosis not present

## 2017-10-24 DIAGNOSIS — K3184 Gastroparesis: Secondary | ICD-10-CM | POA: Diagnosis not present

## 2017-10-24 DIAGNOSIS — D6851 Activated protein C resistance: Secondary | ICD-10-CM | POA: Diagnosis not present

## 2017-10-24 DIAGNOSIS — Z79899 Other long term (current) drug therapy: Secondary | ICD-10-CM | POA: Insufficient documentation

## 2017-10-24 DIAGNOSIS — E1151 Type 2 diabetes mellitus with diabetic peripheral angiopathy without gangrene: Secondary | ICD-10-CM | POA: Insufficient documentation

## 2017-10-24 DIAGNOSIS — I132 Hypertensive heart and chronic kidney disease with heart failure and with stage 5 chronic kidney disease, or end stage renal disease: Secondary | ICD-10-CM | POA: Diagnosis not present

## 2017-10-24 DIAGNOSIS — Z7982 Long term (current) use of aspirin: Secondary | ICD-10-CM | POA: Diagnosis not present

## 2017-10-24 DIAGNOSIS — Z95811 Presence of heart assist device: Secondary | ICD-10-CM

## 2017-10-24 DIAGNOSIS — Z955 Presence of coronary angioplasty implant and graft: Secondary | ICD-10-CM | POA: Diagnosis not present

## 2017-10-24 DIAGNOSIS — Z5181 Encounter for therapeutic drug level monitoring: Secondary | ICD-10-CM

## 2017-10-24 DIAGNOSIS — I10 Essential (primary) hypertension: Secondary | ICD-10-CM

## 2017-10-24 DIAGNOSIS — N186 End stage renal disease: Secondary | ICD-10-CM | POA: Insufficient documentation

## 2017-10-24 DIAGNOSIS — Z9049 Acquired absence of other specified parts of digestive tract: Secondary | ICD-10-CM | POA: Insufficient documentation

## 2017-10-24 DIAGNOSIS — I2510 Atherosclerotic heart disease of native coronary artery without angina pectoris: Secondary | ICD-10-CM | POA: Insufficient documentation

## 2017-10-24 DIAGNOSIS — I5022 Chronic systolic (congestive) heart failure: Secondary | ICD-10-CM | POA: Diagnosis not present

## 2017-10-24 DIAGNOSIS — E1122 Type 2 diabetes mellitus with diabetic chronic kidney disease: Secondary | ICD-10-CM | POA: Insufficient documentation

## 2017-10-24 DIAGNOSIS — Z86718 Personal history of other venous thrombosis and embolism: Secondary | ICD-10-CM | POA: Diagnosis not present

## 2017-10-24 DIAGNOSIS — K219 Gastro-esophageal reflux disease without esophagitis: Secondary | ICD-10-CM | POA: Insufficient documentation

## 2017-10-24 DIAGNOSIS — E785 Hyperlipidemia, unspecified: Secondary | ICD-10-CM | POA: Insufficient documentation

## 2017-10-24 DIAGNOSIS — K5090 Crohn's disease, unspecified, without complications: Secondary | ICD-10-CM | POA: Diagnosis not present

## 2017-10-24 DIAGNOSIS — Z7901 Long term (current) use of anticoagulants: Secondary | ICD-10-CM | POA: Insufficient documentation

## 2017-10-24 DIAGNOSIS — E1143 Type 2 diabetes mellitus with diabetic autonomic (poly)neuropathy: Secondary | ICD-10-CM | POA: Diagnosis not present

## 2017-10-24 DIAGNOSIS — I4892 Unspecified atrial flutter: Secondary | ICD-10-CM | POA: Diagnosis not present

## 2017-10-24 DIAGNOSIS — I82499 Acute embolism and thrombosis of other specified deep vein of unspecified lower extremity: Secondary | ICD-10-CM

## 2017-10-24 LAB — BASIC METABOLIC PANEL
ANION GAP: 16 — AB (ref 5–15)
BUN: 32 mg/dL — ABNORMAL HIGH (ref 6–20)
CALCIUM: 9 mg/dL (ref 8.9–10.3)
CHLORIDE: 100 mmol/L — AB (ref 101–111)
CO2: 20 mmol/L — AB (ref 22–32)
Creatinine, Ser: 5.35 mg/dL — ABNORMAL HIGH (ref 0.61–1.24)
GFR calc non Af Amer: 11 mL/min — ABNORMAL LOW (ref >60)
GFR, EST AFRICAN AMERICAN: 13 mL/min — AB (ref >60)
Glucose, Bld: 249 mg/dL — ABNORMAL HIGH (ref 65–99)
POTASSIUM: 4.8 mmol/L (ref 3.5–5.1)
Sodium: 136 mmol/L (ref 135–145)

## 2017-10-24 LAB — LACTATE DEHYDROGENASE: LDH: 295 U/L — ABNORMAL HIGH (ref 98–192)

## 2017-10-24 LAB — CBC
HEMATOCRIT: 41.2 % (ref 39.0–52.0)
HEMOGLOBIN: 13.2 g/dL (ref 13.0–17.0)
MCH: 27.6 pg (ref 26.0–34.0)
MCHC: 32 g/dL (ref 30.0–36.0)
MCV: 86 fL (ref 78.0–100.0)
Platelets: 214 10*3/uL (ref 150–400)
RBC: 4.79 MIL/uL (ref 4.22–5.81)
RDW: 18.2 % — ABNORMAL HIGH (ref 11.5–15.5)
WBC: 5.9 10*3/uL (ref 4.0–10.5)

## 2017-10-24 LAB — PROTIME-INR
INR: 2.2
Prothrombin Time: 24.2 seconds — ABNORMAL HIGH (ref 11.4–15.2)

## 2017-10-24 MED ORDER — HYDRALAZINE HCL 25 MG PO TABS: 37.5000 mg | ORAL_TABLET | Freq: Three times a day (TID) | ORAL | 3 refills | Status: DC

## 2017-10-24 NOTE — Progress Notes (Addendum)
Patient presents for 1 month  follow up in [facility] today. Reports no problems with VAD equipment or concerns with drive line.  Vital Signs:  Doppler Pressure 120   Automatc BP: 122/64 (93) HR:88   SPO2:99  %  Weight: 174.2 lb w/o eqt Last weight: 173.2 lb Home weights: 170-175 lbs   VAD Indication: Destination therapy- will fax information to [facility] for transplant eval process to begin    VAD interrogation & Equipment Management: Speed:5600 Flow: 3.8 Power:4.3 w    PI:5.2  Alarms: 2/18- 1 low flow alarm during HD Events: >48 PI events on HD days  Fixed speed 5600 Low speed limit: 5300  Primary Controller:  Replace back up battery in 53month. Back up controller:   Replace back up battery in 53 months.  Annual Equipment Maintenance on UBC/PM was performed on 06/2017.   I reviewed the LVAD parameters from today and compared the results to the patient's prior recorded data. LVAD interrogation was NEGATIVE for significant power changes, NEGATIVE for clinical alarms and STABLE for PI events/speed drops. No programming changes were made and pump is functioning within specified parameters. Pt is performing daily controller and system monitor self tests along with completing weekly and monthly maintenance for LVAD equipment.  LVAD equipment check completed and is in good working order. Back-up equipment present. Charged back up battery and performed self-test on equipment.   Exit Site Care: Drive line is being maintained weekly  by his daughter. Drive line exit site well healed and incorporated. The velour is fully implanted at exit site. Dressing dry and intact. No erythema or drainage. Stabilization device present and accurately applied. Pt denies fever or chills. Pt states they have adequate dressing supplies at home.    Significant Events on VAD Support: -10/2016>> poss drive line infection, CT ABD neg, ID consult-doxy -11/09/16>> admit for poss drive line infection,  IV abx -03/24/17>> doxy for poss drive line infection -05/04/17>> drive line debridement with wound-vac -06/2017>> drive line +proteus, IV abx, Bactrim x14 days  Device:St jude Dual Therapies: on. Monitor on at 150  BP & Labs:  MAP 120 - Doppler is reflecting modified systolic  Hgb 125.8- No S/S of bleeding. Specifically denies melena/BRBPR or nosebleeds.  LDH stable at 295 with established baseline of 245- 350. Denies tea-colored urine. No power elevations noted on interrogation.   Plan: [facility] 2. Increase hydralazine to 37.5 mg TID on non-HD days 3. Will reach out to cardiac rehab regarding start time.   LBalinda QuailsRN VAD Coordinator   Office: [phone] Emergency VAD Pager: [phone]  Cardiology: Dr. MAundra Dubin HPI: 52y.o. with history of CAD s/p anterior MI in 2010 and NSTEMI in 3/18 with DES to pRCA and mLAD and ischemic cardiomyopathy now s/p Heartmate 3 LVAD and ESRD presents for followup of CHF/LVAD.  Patient developed low output heart failure.  He was not deemed to be a good transplant candidate.  He was admitted in 10/18 and Heartmate 3 LVAD was placed.  He had baseline CKD stage 3 likely from diabetic nephropathy and CHF.  He developed peri-op AKI and ended up on dialysis.  No renal recovery to date.  St Jude ICD has been turned back on and ramp echo was done prior to discharge in 10/18, speed increased to 5600 rpm.    He was admitted in 11/18 with nausea/vomiting and abdominal pain.  This appears to have been due to diabetic gastroparesis (  extensive GI evaluation). He had atrial flutter this admission that was converted to NSR with amiodarone.  He improved considerably with scheduled Reglan and erythromycin. He was re-admitted later in 11/18 with the same symptoms, suspect diabetic gastroparesis and was again treated with erythromycin.  He was not able to start domperidone due to prolonged QT interval. He had nausea again  about a week ago but was able to manage at home with Phenergan suppositories.   Admitted 1/8 -> 09/16/17 for uncontrolled vomiting in setting of diabetic gastroparesis. Improved with phenergan suppositories and given erythromycin TID for 7 days post-discharge.  Referred for follow up with a gastroparesis specialist at [facility] (Dr Derrill Kay).   He saw Dr Derrill Kay at [facility] and has been set up for repeat gastric emptying study and electrogastrogram.  Currently not have any nausea or vomiting.  He had a low flow episode at HD on 2/18.  He has not had any low flows in the past but has a lot of PI events on HD days.  Hydralazine recently decreased to 25 mg tid on non-HD days and MAP now in 90s.  No significant exertional dyspnea. No lightheadedness.  No orthopnea/PND.  Planned to get AV graft next week.   Reports taking Coumadin as prescribed and adherence to anticoagulation based dietary restrictions. Denies BRBPR or melena. No dark urine or hematuria.   Labs (11/18): hgb 10.4 => 8.7, LDH 213 Labs (08/04/17): hgb 9 Labs (1/19): HgbA1c 6.1 Labs (2/19): hgb 13.2  PMH: 1. Chronic systolic CHF: Ischemic cardiomyopathy. St Jude ICD.  - Echo (11/17) with EF 25-30%, moderate diastolic dysfunction, mild MR, mildly dilated RV with moderately decreased systolic function, peak RV-RA gradient 48 mmHg.  - Echo (3/18) with EF 25-30%, wall motion abnormalities, normal RV size and systolic function.  - Admission 3/18 with cardiogenic shock and AKI, required milrinone gtt.  - Echo (8/18) with EF 20-25%, mild LVH, moderate LV dilation, mild MR.  - CPX (8/18) with peak VO2 12.4, VE/VCO2 slope 35, RER 1.05 => severe HR limitation.  - Heartmate 3 LVAD 10/18.  2. CAD: Anterior MI 2005, had bifurcation stenting LAD/diagonal. Repeat cath 2010 with patent stents.  - NSTEMI 3/18: LHC with 99% ulcerated lesion proximal RCA, 95% mid LAD => PCI with DES to proximal RCA and mid LAD.  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  - Lower extremity venous  dopplers (8/18): chronic DVT - V/Q scan (8/18): Normal, no PE.  4. Crohns disease: History of colectomy.  5. GERD 6. Diabetic gastroparesis: Admission 11/18 with abdominal pain, nausea/vomiting.  7. HTN 8. Hyperlipidemia 9. Type II diabetes  10. ESRD: Post-op LVAD surgery.  11. Carotid dopplers (8/18): mild bilateral stenosis.  12. PFTs (8/18): Mild restriction (no IDL on CT chest).  13. Lung nodules: CT chest 8/18 with small bilateral nodules.  14. PAD: ABIs with moderate bilateral disease in 8/18.  15. Atrial flutter: In 11/18 associated with nausea/vomiting from gastroparesis.   Current Outpatient Medications  Medication Sig Dispense Refill  . amLODipine (NORVASC) 5 MG tablet Take 1 tablet (5 mg total) by mouth daily. On non dialysis days. 30 tablet 3  . aspirin EC 81 MG tablet Take 1 tablet (81 mg total) by mouth daily. 90 tablet 3  . atorvastatin (LIPITOR) 40 MG tablet Take 1 tablet (40 mg total) daily at 6 PM by mouth. 30 tablet 6  . Continuous Blood Gluc Sensor (FREESTYLE LIBRE SENSOR SYSTEM) MISC Apply 1 reader to the skin every 10 days. 3 each 11  .  docusate sodium (COLACE) 100 MG capsule Take 2 capsules (200 mg total) by mouth daily. 10 capsule 0  . dronabinol (MARINOL) 2.5 MG capsule Take 1 capsule (2.5 mg total) by mouth 2 (two) times daily before lunch and supper. 60 capsule 1  . erythromycin (E-MYCIN) 250 MG tablet Take 1 tablet (250 mg total) by mouth 3 (three) times daily with meals. 21 tablet 0  . glucose blood (FREESTYLE PRECISION NEO TEST) test strip Use as instructed 100 each 12  . hydrALAZINE (APRESOLINE) 25 MG tablet Take 1.5 tablets (37.5 mg total) by mouth 3 (three) times daily. Take on non-hd days 135 tablet 3  . insulin aspart (NOVOLOG) 100 UNIT/ML injection Inject 3 Units into the skin 3 (three) times daily with meals. (Patient taking differently: Inject 3-5 Units into the skin 3 (three) times daily with meals. ) 1 vial 0  . insulin glargine (LANTUS) 100 UNIT/ML  injection Inject 0.05 mLs (5 Units total) into the skin daily. 10 mL 11  . LORazepam (ATIVAN) 0.5 MG tablet Take 1 tablet (0.5 mg total) by mouth every 12 (twelve) hours as needed for anxiety or sleep. 30 tablet 0  . metoCLOPramide (REGLAN) 5 MG tablet Take 1 tablet (5 mg total) by mouth 4 (four) times daily -  before meals and at bedtime. 130 tablet 6  . Nutritional Supplements (FEEDING SUPPLEMENT, NEPRO CARB STEADY,) LIQD Take 237 mLs by mouth 3 (three) times daily with meals. 90 Can 0  . ondansetron (ZOFRAN ODT) 4 MG disintegrating tablet Take 1 tablet (4 mg total) by mouth every 6 (six) hours as needed for nausea or vomiting. 40 tablet 1  . pantoprazole (PROTONIX) 40 MG tablet Take 40 mg by mouth daily.    . promethazine (PHENERGAN) 12.5 MG tablet Take 1 tablet (12.5 mg total) by mouth every 6 (six) hours as needed for nausea or vomiting. 30 tablet 0  . promethazine (PHENERGAN) 25 MG suppository Place 25 mg rectally every 6 (six) hours as needed for nausea or vomiting.    . sildenafil (REVATIO) 20 MG tablet Take 1 tablet (20 mg total) by mouth 3 (three) times daily. 90 tablet 6  . warfarin (COUMADIN) 5 MG tablet Take 5 mg by mouth daily.      No current facility-administered medications for this encounter.     Metformin and related   Review of systems complete and found to be negative unless listed in HPI.    BP 122/64   Pulse 88   Resp 16   Ht 5' 8"  (1.727 m)   Wt 174 lb 3.2 oz (79 kg)   SpO2 98%   BMI 26.49 kg/m  MAP 93  VAD interrogation & Equipment Management: See nurse's note above.   I reviewed the LVAD parameters from today, and compared the results to the patient's prior recorded data.  No programming changes were made.  The LVAD is functioning within specified parameters.  The patient performs LVAD self-test daily.  LVAD interrogation was negative for any significant power changes, alarms or PI events/speed drops.  LVAD equipment check completed and is in good working  order.  Back-up equipment present.   LVAD education done on emergency procedures and precautions and reviewed exit site care.   Physical Exam: GENERAL: Well appearing this am. NAD.  HEENT: Normal. NECK: Supple, JVP 7-8 cm. Carotids OK.  CARDIAC:  Mechanical heart sounds with LVAD hum present.  LUNGS:  CTAB, normal effort.  ABDOMEN:  NT, ND, no HSM. No bruits or  masses. +BS  LVAD exit site: Well-healed and incorporated. Dressing dry and intact. No erythema or drainage. Stabilization device present and accurately applied. Driveline dressing changed daily per sterile technique. EXTREMITIES:  Warm and dry. No cyanosis, clubbing, rash, or edema.  NEUROLOGIC:  Alert & oriented x 3. Cranial nerves grossly intact. Moves all 4 extremities w/o difficulty. Affect pleasant     ASSESSMENT AND PLAN: 1. Chronic systolic CHF: Ischemic cardiomyopathy.  St Jude ICD.  NSTEMI in March 2018 with DES to LAD and RCA, complicated by cardiogenic shock, low output requiring milrinone.  Echo 8/18 with EF 20-25%, moderate MR.  PX (8/18) with severe functional impairment due to HF.  He is now s/p HM3 LVAD placement. LVAD parameters reviewed today and stable.  No complaints. MAP remains in 90s on lower hydralazine.  Hemoglobin stable. NYHA II. Volumes status stable on exam.  - Continue amlodipine 5 mg daily.  - Continue ASA 81.  Continue warfarin INR 2-3 for now with no overt GI bleeding.  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  - MAP still high, will increase hydralazine to 37.5 mg tid on non-HD days.  - Plan to refer to [facility] for evaluation for heart/kidney transplant.  2. ESRD: Developed peri-operatively after HM3 placement.  He is tolerating HD.  He had 1 low flow yesterday with HD likely related to fluid removal. - He will be admitted next week to bridge off warfarin for AV graft placement.  3. CAD: NSTEMI in 3/18. LHC with 99% ulcerated lesion proximal RCA with left to right collaterals, 95% mid LAD stenosis  after mid LAD stent. s/p PCI to RCA and LAD on 11/25/16.   - Continue statin and ASA. 4. h/o DVTs: Factor V Leiden heterozygote. - On coumadin. 5. Diabetic gastroparesis: Difficult to manage. Continue Phenergan suppositories if nausea/vomiting returns. He has seen gastroparesis specialist at [facility] and workup is continuing. . - Continue reglan 5 mg tid/ac 6. DM: HgbA1c is now well-controlled.  7. HTN: MAP high, increasing hydralazine as above.  8. PAD: Moderate bilateral disease on ABIs in 8/18.  Denies claudication currently, no pedal ulcers.  9. Atrial flutter: Paroxysmal.  He has been in NSR recently.   Followup in 2 months.   Loralie Champagne, MD  10/24/2017

## 2017-10-24 NOTE — Telephone Encounter (Signed)
-----   Message from Lezlie Octave, RN sent at 10/13/2017  2:52 PM EST ----- Regarding: RE: Coumadin clearance Lovenox Bridge Will review with docs this Monday and let you know. Will probably need admission for Heparin bridge.   Wilfrid Lund [phone]  ----- Message ----- From: Willy Eddy, RN Sent: 10/13/2017  10:50 AM To: Adora Fridge, RPH-CPP, Lezlie Octave, RN Subject: Coumadin clearance Lovenox Bridge              Requesting Coumadin clearance and bridging with Lovenox if indicated. Patient is scheduled for A/V graft right arm and ligation with Dr. Adele Barthel on 10/31/17 and will need to hold Coumadin x 5 days pre-op. Please advise. Becky RN

## 2017-10-24 NOTE — Addendum Note (Signed)
Encounter addended by: Larey Dresser, MD on: 10/24/2017 11:02 PM  Actions taken: LOS modified

## 2017-10-24 NOTE — Patient Instructions (Signed)
Take 37.5 mg of hydralazine 3 times a day on non-dialysis days  You look great!

## 2017-10-26 ENCOUNTER — Telehealth: Payer: Self-pay | Admitting: *Deleted

## 2017-10-26 NOTE — Telephone Encounter (Signed)
-----   Message from Larey Dresser, MD sent at 10/25/2017  8:28 PM EST ----- Regarding: RE: Coumadin clearance Lovenox Bridge Holding warfarin this weekend, admitting Monday for heparin gtt ----- Message ----- From: Willy Eddy, RN Sent: 10/25/2017   5:12 PM To: Larey Dresser, MD, Adora Fridge, RPH-CPP, # Subject: RE: Coumadin clearance Lovenox Bridge          What is the decision for anticoag. Management for this patient pre-op? Surgery is Tuesday 10/31/17 Thank Becky RN ----- Message ----- From: Lezlie Octave, RN Sent: 10/13/2017   2:52 PM To: Candy Sledge, RN, Willy Eddy, RN, # Subject: RE: Coumadin clearance Lovenox Bridge          Will review with docs this Monday and let you know. Will probably need admission for Heparin bridge.   Wilfrid Lund [phone]  ----- Message ----- From: Willy Eddy, RN Sent: 10/13/2017  10:50 AM To: Adora Fridge, RPH-CPP, Lezlie Octave, RN Subject: Coumadin clearance Lovenox Bridge              Requesting Coumadin clearance and bridging with Lovenox if indicated. Patient is scheduled for A/V graft right arm and ligation with Dr. Adele Barthel on 10/31/17 and will need to hold Coumadin x 5 days pre-op. Please advise. Becky RN

## 2017-10-27 ENCOUNTER — Telehealth (HOSPITAL_COMMUNITY): Payer: Self-pay | Admitting: Unknown Physician Specialty

## 2017-10-27 ENCOUNTER — Ambulatory Visit (HOSPITAL_COMMUNITY)
Admission: RE | Admit: 2017-10-27 | Discharge: 2017-10-27 | Disposition: A | Discharge status: Home / Self Care | Payer: BLUE CROSS/BLUE SHIELD | Source: Ambulatory Visit | Attending: Cardiology | Admitting: Cardiology

## 2017-10-27 ENCOUNTER — Other Ambulatory Visit (HOSPITAL_COMMUNITY): Payer: Self-pay | Admitting: Unknown Physician Specialty

## 2017-10-27 ENCOUNTER — Ambulatory Visit (HOSPITAL_COMMUNITY): Payer: Self-pay | Admitting: Pharmacist

## 2017-10-27 DIAGNOSIS — Z95811 Presence of heart assist device: Secondary | ICD-10-CM

## 2017-10-27 DIAGNOSIS — Z7901 Long term (current) use of anticoagulants: Secondary | ICD-10-CM

## 2017-10-27 DIAGNOSIS — Z5181 Encounter for therapeutic drug level monitoring: Secondary | ICD-10-CM

## 2017-10-27 DIAGNOSIS — I82499 Acute embolism and thrombosis of other specified deep vein of unspecified lower extremity: Secondary | ICD-10-CM

## 2017-10-27 LAB — PROTIME-INR
INR: 2.28
Prothrombin Time: 25 seconds — ABNORMAL HIGH (ref 11.4–15.2)

## 2017-10-27 NOTE — Telephone Encounter (Signed)
Called pt to inform him of INR results. Pt instructed to hold coumadin. We will admit him Sunday morning for heparin bridge in prep for his AV graft surgery on Tuesday. Pt verbalized understanding of all instructions given.  [name] RN, BSN VAD Coordinator 24/7 Pager [phone]

## 2017-10-27 NOTE — H&P (Signed)
Advanced Heart Failure VAD History and Physical Note   PCP-Cardiologist: No primary care provider on file.   Reason for Admission: ESRD for fistula placement with LVAD  HPI:    [name] is a 52 y.o. with history of CAD s/p anterior MI in 2010 and NSTEMI in 3/18 with DES to pRCA and mLAD and ischemic cardiomyopathy now s/p Heartmate 3 LVAD in 10/18. Post-op course c/b acute on chronic renal failure ->  ESRD. Now on HD M/W/F. S/p STJ ICD. Being admitted today for HD access placement with Dr. Bridgett Larsson on 2/26.    Admitted 1/8 -> 09/16/17 for uncontrolled vomiting in setting of diabetic gastroparesis. Improved with phenergan suppositories and given erythromycin TID for 7 days post-discharge.  Referred for follow up with a gastroparesis specialist at [facility] (Dr Derrill Kay).   He saw Dr Derrill Kay at [facility] and has been set up for repeat gastric emptying study and electrogastrogram.  Currently not have any nausea or vomiting.    Admitted today for heparin bridge for fistula placement on 2/26. Last dose of coumadin was 10/27/2017. He continues on dialysis 3 times a week. Denies SOB/PND/Orthopnea. No BRBPR. Weight stable.No fevers, chills or problems with driveline. No bleeding, melena or neuro symptoms. No VAD alarms. Taking all meds as prescribed.    LVAD INTERROGATION:  HeartMate II LVAD:  Flow 3.3 liters/min, speed 5600, power 4.4, PI 7.5.    Review of Systems: [y] = yes, [ ]  = no   General: Weight gain [ ] ; Weight loss [ ] ; Anorexia [ ] ; Fatigue [Y ]; Fever [ ] ; Chills [ ] ; Weakness [ ]   Cardiac: Chest pain/pressure [ ] ; Resting SOB [ ] ; Exertional SOB [ ] ; Orthopnea [ ] ; Pedal Edema [ ] ; Palpitations [ ] ; Syncope [ ] ; Presyncope [ ] ; Paroxysmal nocturnal dyspnea[ ]   Pulmonary: Cough [ ] ; Wheezing[ ] ; Hemoptysis[ ] ; Sputum [ ] ; Snoring [ ]   GI: Vomiting[ ] ; Dysphagia[ ] ; Melena[ ] ; Hematochezia [ ] ; Heartburn[ ] ; Abdominal pain [ ] ; Constipation [ ] ; Diarrhea [ ] ; BRBPR [ ]   GU: Hematuria[ ] ; Dysuria [ ] ;  Nocturia[ ]   Vascular: Pain in legs with walking [ ] ; Pain in feet with lying flat [ ] ; Non-healing sores [ ] ; Stroke [ ] ; TIA [ ] ; Slurred speech [ ] ;  Neuro: Headaches[ ] ; Vertigo[ ] ; Seizures[ ] ; Paresthesias[ ] ;Blurred vision [ ] ; Diplopia [ ] ; Vision changes [ ]   Ortho/Skin: Arthritis [Y ]; Joint pain [ Y]; Muscle pain [ ] ; Joint swelling [ ] ; Back Pain [ ] ; Rash [ ]   Psych: Depression[ Y]; Anxiety[ ]   Heme: Bleeding problems [ ] ; Clotting disorders [ ] ; Anemia [ Y]  Endocrine: Diabetes [Y ]; Thyroid dysfunction[ ]     Home Medications Prior to Admission medications   Medication Sig Start Date End Date Taking? Authorizing Provider  amLODipine (NORVASC) 5 MG tablet Take 1 tablet (5 mg total) by mouth daily. On non dialysis days. 09/17/17  Yes Daune Perch, NP  aspirin EC 81 MG tablet Take 1 tablet (81 mg total) by mouth daily. 07/26/17  Yes Larey Dresser, MD  atorvastatin (LIPITOR) 40 MG tablet Take 1 tablet (40 mg total) daily at 6 PM by mouth. 07/16/17  Yes Bhagat, Bhavinkumar, PA  dronabinol (MARINOL) 2.5 MG capsule Take 1 capsule (2.5 mg total) by mouth 2 (two) times daily before lunch and supper. 09/16/17  Yes Daune Perch, NP  hydrALAZINE (APRESOLINE) 25 MG tablet Take 1.5 tablets (37.5 mg total) by  mouth 3 (three) times daily. Take on non-hd days 10/24/17  Yes Larey Dresser, MD  insulin aspart (NOVOLOG) 100 UNIT/ML injection Inject 3 Units into the skin 3 (three) times daily with meals. Patient taking differently: Inject 5 Units into the skin 3 (three) times daily with meals.  07/03/17  Yes Clegg, Amy D, NP  insulin glargine (LANTUS) 100 UNIT/ML injection Inject 0.05 mLs (5 Units total) into the skin daily. Patient taking differently: Inject 10 Units into the skin daily.  07/03/17  Yes Clegg, Amy D, NP  LORazepam (ATIVAN) 0.5 MG tablet Take 1 tablet (0.5 mg total) by mouth every 12 (twelve) hours as needed for anxiety or sleep. 08/04/17  Yes Clegg, Amy D, NP  metoCLOPramide  (REGLAN) 5 MG tablet Take 1 tablet (5 mg total) by mouth 4 (four) times daily -  before meals and at bedtime. Patient taking differently: Take 10 mg by mouth 3 (three) times daily.  08/04/17  Yes Clegg, Amy D, NP  Nutritional Supplements (FEEDING SUPPLEMENT, NEPRO CARB STEADY,) LIQD Take 237 mLs by mouth 3 (three) times daily with meals. Patient taking differently: Take 237 mLs by mouth daily.  09/16/17  Yes Daune Perch, NP  ondansetron (ZOFRAN ODT) 4 MG disintegrating tablet Take 1 tablet (4 mg total) by mouth every 6 (six) hours as needed for nausea or vomiting. 01/10/17  Yes Esterwood, Amy S, PA-C  pantoprazole (PROTONIX) 40 MG tablet Take 40 mg by mouth daily.   Yes [provider]  promethazine (PHENERGAN) 12.5 MG tablet Take 1 tablet (12.5 mg total) by mouth every 6 (six) hours as needed for nausea or vomiting. Patient taking differently: Take 25 mg by mouth every 6 (six) hours as needed for nausea or vomiting.  09/16/17  Yes Daune Perch, NP  sildenafil (REVATIO) 20 MG tablet Take 1 tablet (20 mg total) by mouth 3 (three) times daily. 07/26/17  Yes Larey Dresser, MD  warfarin (COUMADIN) 5 MG tablet Take 5 mg by mouth daily.    Yes [provider]  Continuous Blood Gluc Sensor (Chase) MISC Apply 1 reader to the skin every 10 days. 08/17/17   Zenia Resides, MD  docusate sodium (COLACE) 100 MG capsule Take 2 capsules (200 mg total) by mouth daily. Patient not taking: Reported on 10/27/2017 07/03/17   Darrick Grinder D, NP  glucose blood (FREESTYLE PRECISION NEO TEST) test strip Use as instructed 08/17/17   Zenia Resides, MD    Past Medical History: Past Medical History:  Diagnosis Date  . Angina   . ASCVD (arteriosclerotic cardiovascular disease)    , Anterior infarction 2005, LAD diagonal bifurcation intervention 03/2004  . Automatic implantable cardiac defibrillator -St. Jude's       . Benign neoplasm of colon   . CHF (congestive heart  failure) ([place])   . Chronic systolic heart failure ([place])   . Coronary artery disease     Widely patent previously placed stents in the left anterior   . Crohn's disease ([place])   . 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 Dialysis patient ([place])   . Dyspnea   . Gastroparesis   . GERD (gastroesophageal reflux disease)   . High cholesterol   . Hyperlipidemia   . Hypersomnolent    Previous diagnosis of narcolepsy  . Hypertension, essential   . Ischemic cardiomyopathy    Ejection fraction 15-20% catheterization 2010  . Type II or unspecified type diabetes mellitus without mention of  complication, not stated as uncontrolled   . Unspecified gastritis and gastroduodenitis without mention of hemorrhage     Past Surgical History: Past Surgical History:  Procedure Laterality Date  . APPENDECTOMY    . AV FISTULA PLACEMENT Right 06/26/2017   Procedure: ARTERIOVENOUS (AV) FISTULA CREATION VERSUS GRAFT RIGHT  ARM;  Surgeon: Conrad Inwood, MD;  Location: [place];  Service: Vascular;  Laterality: Right;  . CARDIAC DEFIBRILLATOR PLACEMENT  2010   St. Jude ICD  . COLECTOMY  ~ 2003   "for Crohn's" ascending colon.    . CORONARY STENT INTERVENTION N/A 11/25/2016   Procedure: Coronary Stent Intervention;  Surgeon: Leonie Man, MD;  Location: [place] CV LAB;  Service: Cardiovascular;  Laterality: N/A;  . EP IMPLANTABLE DEVICE N/A 09/14/2016   Procedure: ICD Generator Changeout;  Surgeon: Deboraha Sprang, MD;  Location: [place] CV LAB;  Service: Cardiovascular;  Laterality: N/A;  . ESOPHAGOGASTRODUODENOSCOPY N/A 07/12/2017   Procedure: ESOPHAGOGASTRODUODENOSCOPY (EGD);  Surgeon: Jerene Bears, MD;  Location: [facility] ENDOSCOPY;  Service: Gastroenterology;  Laterality: N/A;  VAD pt so VAD team will need to accompany pt.   [name] FETAL SURGERY FOR CONGENITAL HERNIA     ????? pt has no knowledge of this..    . INGUINAL HERNIA REPAIR    . INSERTION OF DIALYSIS CATHETER Right 06/26/2017    Procedure: INSERTION OF TUNNELED  DIALYSIS CATHETER RIGHT INTERNAL JUGULAR;  Surgeon: Conrad Tilton [place], MD;  Location: [place];  Service: Vascular;  Laterality: Right;  . INSERTION OF IMPLANTABLE LEFT VENTRICULAR ASSIST DEVICE N/A 06/12/2017   Procedure: INSERTION OF IMPLANTABLE LEFT VENTRICULAR ASSIST [place] 3;  Surgeon: Ivin Poot, MD;  Location: [place];  Service: Open Heart Surgery;  Laterality: N/A;  HM3 LVAD  CIRC ARREST  NITRIC OXIDE  . IR THORACENTESIS ASP PLEURAL SPACE W/IMG GUIDE  06/28/2017  . MULTIPLE EXTRACTIONS WITH ALVEOLOPLASTY N/A 06/08/2017   Procedure: MULTIPLE EXTRACTION WITH ALVEOLOPLASTY AND PRE PROSTHETIC SURGERY AS NEEDED;  Surgeon: Lenn Cal, DDS;  Location: [place];  Service: Oral Surgery;  Laterality: N/A;  . RIGHT HEART CATH N/A 06/07/2017   Procedure: RIGHT HEART CATH;  Surgeon: Larey Dresser, MD;  Location: [place] CV LAB;  Service: Cardiovascular;  Laterality: N/A;  . RIGHT/LEFT HEART CATH AND CORONARY ANGIOGRAPHY N/A 11/23/2016   Procedure: Right/Left Heart Cath and Coronary Angiography;  Surgeon: Larey Dresser, MD;  Location: [place] CV [facility];  Service: Cardiovascular;  Laterality: N/A;  . TEE WITHOUT CARDIOVERSION N/A 06/12/2017   Procedure: TRANSESOPHAGEAL ECHOCARDIOGRAM (TEE);  Surgeon: Prescott Gum, Collier Salina, MD;  Location: [place];  Service: Open Heart Surgery;  Laterality: N/A;    Family History: Family History  Problem Relation Age of Onset  . Colon polyps Mother   . Diabetes Mother   . Heart attack Father   . Diabetes Father   . Stroke Paternal Grandfather   . Colitis Sister   . Heart disease Brother   . Colitis Sister   . Colon cancer Neg Hx     Social History: Social History   Socioeconomic History  . Marital status: Married    Spouse name: Not on file  . Number of children: Not on file  . Years of education: Not on file  . Highest education level: Not on file  Social Needs  . Financial resource strain: Not on file  .  Food insecurity - worry: Not on file  . Food insecurity - inability: Not on file  .  Transportation needs - medical: Not on file  . Transportation needs - non-medical: Not on file  Occupational History  . Not on file  Tobacco Use  . Smoking status: Never Smoker  . Smokeless tobacco: Never Used  Substance and Sexual Activity  . Alcohol use: No  . Drug use: No  . Sexual activity: No  Other Topics Concern  . Not on file  Social History Narrative  . Not on file    Allergies:  Allergies  Allergen Reactions  . Metformin And Related Diarrhea    Objective:    Vital Signs:        BP: 121/92 (101) Doppler 503 (modified systolic) Mean arterial Pressure: 101 (no meds yet)    Physical Exam    General:  NAD.  HEENT: normal  Neck: supple. JVP not elevated.  Carotids 2+ bilat; no bruits. No lymphadenopathy or thryomegaly appreciated. Cor: LVAD hum.  R chest Perm cath Lungs: Clear. Abdomen: obese soft, nontender, non-distended. No hepatosplenomegaly. No bruits or masses. Good bowel sounds. Driveline site clean. Anchor in place.  Extremities: no cyanosis, clubbing, rash. Warm no edema  Neuro: alert & oriented x 3. No focal deficits. Moves all 4 without problem    Telemetry   NSR 70s Personally reviewed   EKG   Pending   Labs    Basic Metabolic Panel: Recent Labs  Lab 10/24/17 1000  NA 136  K 4.8  CL 100*  CO2 20*  GLUCOSE 249*  BUN 32*  CREATININE 5.35*  CALCIUM 9.0    Liver Function Tests: No results for input(s): AST, ALT, ALKPHOS, BILITOT, PROT, ALBUMIN in the last 168 hours. No results for input(s): LIPASE, AMYLASE in the last 168 hours. No results for input(s): AMMONIA in the last 168 hours.  CBC: Recent Labs  Lab 10/24/17 1000  WBC 5.9  HGB 13.2  HCT 41.2  MCV 86.0  PLT 214    Cardiac Enzymes: No results for input(s): CKTOTAL, CKMB, CKMBINDEX, TROPONINI in the last 168 hours.  BNP: BNP (last 3 results) Recent Labs    06/18/17 2305  06/26/17 0004 07/03/17 0037  BNP 746.0* 1,409.3* 758.9*    ProBNP (last 3 results) No results for input(s): PROBNP in the last 8760 hours.   CBG: No results for input(s): GLUCAP in the last 168 hours.  Coagulation Studies: Recent Labs    10/27/17 1131  LABPROT 25.0*  INR 2.28    Other results: EKG:  Imaging     No results found.    Patient Profile:     Assessment/Plan:    1 ESRD - Admit for AVF placement and heparin bridge. VVS appreciated.  - Will let Renal know in am will need HD M/W/F - Last dose of coumadin 2/22. Patient seen with PharmD at bedside. Wil start heparin when INR < 2.0 (no bolus) 2. Chronic systolic HF: Ischemic cardiomyopathy. St Jude ICD. NSTEMI in March 2018 with DES to LAD and RCA, complicated by cardiogenic shock, low output requiring milrinone. Echo 8/18 with EF 20-25%, moderate [name]. PX (8/18) with severe functional impairment due to HF. - s/p HM-3 VAD in 10/18. VAD interrogated personally. Parameters stable. - NYHA II with VAD - Volume status managed with HD 3. CAD: NSTEMI in 3/18. LHC with 99% ulcerated lesion proximal RCA with left to right collaterals, 95% mid LAD stenosis after mid LAD stent. s/p PCI to RCA and LAD on 11/25/16.  - No s/s ischemia - Continue aspirin and statin.  4. LVAD present -  VAD interrogated personally. Parameters stable - Driveline ok - labs pending - MAP high see below for management 5. h/o DVTs: Factor V Leiden heterozygote. - On coumadin. Bridging with heparin  6. Diabetic gastroparesis: Difficult to manage. Continue Phenergan suppositories if nausea/vomiting returns. He has seen gastroparesis specialist at [facility] and workup is continuing. . - Continue reglan 5 mg tid/ac - symptoms stable currently 7. DM: HgbA1c is now well-controlled.  8. HTN:  - MAP high on admit but hasn't had meds yet. Will give. Can adjust as needed 9. PAD: Moderate bilateral disease on ABIs in 8/18. Denies claudication  currently, no pedal ulcers.  10. Atrial Flutter-Paroxysmal  - in NSR today  I reviewed the LVAD parameters from today, and compared the results to the patient's prior recorded data.  No programming changes were made.  The LVAD is functioning within specified parameters.  The patient performs LVAD self-test daily.  LVAD interrogation was negative for any significant power changes, alarms or PI events/speed drops.  LVAD equipment check completed and is in good working order.  Back-up equipment present.   LVAD education done on emergency procedures and precautions and reviewed exit site care.  Length of Stay: 0  [name], MD  12:17 PM   VAD Team Pager [phone] (7am - 7am) +++VAD ISSUES ONLY+++   Advanced Heart Failure Team Pager [phone] (M-F; 7a - 4p)  Please contact [facility] for night-coverage after hours (4p -7a ) and weekends on [email or web address] for all non- LVAD Issues

## 2017-10-29 ENCOUNTER — Inpatient Hospital Stay (HOSPITAL_COMMUNITY)
Admission: EM | Admit: 2017-10-29 | Discharge: 2017-11-04 | DRG: 264 | Disposition: A | Discharge status: Home / Self Care | Payer: BLUE CROSS/BLUE SHIELD | Source: Ambulatory Visit | Attending: Cardiology | Admitting: Cardiology

## 2017-10-29 ENCOUNTER — Inpatient Hospital Stay (HOSPITAL_COMMUNITY): Payer: BLUE CROSS/BLUE SHIELD

## 2017-10-29 ENCOUNTER — Other Ambulatory Visit: Payer: Self-pay

## 2017-10-29 DIAGNOSIS — E785 Hyperlipidemia, unspecified: Secondary | ICD-10-CM | POA: Diagnosis present

## 2017-10-29 DIAGNOSIS — I132 Hypertensive heart and chronic kidney disease with heart failure and with stage 5 chronic kidney disease, or end stage renal disease: Secondary | ICD-10-CM | POA: Diagnosis not present

## 2017-10-29 DIAGNOSIS — I252 Old myocardial infarction: Secondary | ICD-10-CM | POA: Diagnosis not present

## 2017-10-29 DIAGNOSIS — Z95811 Presence of heart assist device: Secondary | ICD-10-CM

## 2017-10-29 DIAGNOSIS — I2510 Atherosclerotic heart disease of native coronary artery without angina pectoris: Secondary | ICD-10-CM | POA: Diagnosis present

## 2017-10-29 DIAGNOSIS — N179 Acute kidney failure, unspecified: Secondary | ICD-10-CM | POA: Diagnosis not present

## 2017-10-29 DIAGNOSIS — K3184 Gastroparesis: Secondary | ICD-10-CM | POA: Diagnosis present

## 2017-10-29 DIAGNOSIS — I4892 Unspecified atrial flutter: Secondary | ICD-10-CM | POA: Diagnosis present

## 2017-10-29 DIAGNOSIS — K219 Gastro-esophageal reflux disease without esophagitis: Secondary | ICD-10-CM | POA: Diagnosis present

## 2017-10-29 DIAGNOSIS — E1143 Type 2 diabetes mellitus with diabetic autonomic (poly)neuropathy: Secondary | ICD-10-CM | POA: Diagnosis present

## 2017-10-29 DIAGNOSIS — Z8249 Family history of ischemic heart disease and other diseases of the circulatory system: Secondary | ICD-10-CM

## 2017-10-29 DIAGNOSIS — E1122 Type 2 diabetes mellitus with diabetic chronic kidney disease: Secondary | ICD-10-CM | POA: Diagnosis not present

## 2017-10-29 DIAGNOSIS — Z9049 Acquired absence of other specified parts of digestive tract: Secondary | ICD-10-CM

## 2017-10-29 DIAGNOSIS — Z794 Long term (current) use of insulin: Secondary | ICD-10-CM

## 2017-10-29 DIAGNOSIS — Z7982 Long term (current) use of aspirin: Secondary | ICD-10-CM

## 2017-10-29 DIAGNOSIS — E1151 Type 2 diabetes mellitus with diabetic peripheral angiopathy without gangrene: Secondary | ICD-10-CM | POA: Diagnosis present

## 2017-10-29 DIAGNOSIS — Z833 Family history of diabetes mellitus: Secondary | ICD-10-CM | POA: Diagnosis not present

## 2017-10-29 DIAGNOSIS — N185 Chronic kidney disease, stage 5: Secondary | ICD-10-CM | POA: Diagnosis not present

## 2017-10-29 DIAGNOSIS — D6851 Activated protein C resistance: Secondary | ICD-10-CM | POA: Diagnosis not present

## 2017-10-29 DIAGNOSIS — D631 Anemia in chronic kidney disease: Secondary | ICD-10-CM | POA: Diagnosis present

## 2017-10-29 DIAGNOSIS — I255 Ischemic cardiomyopathy: Secondary | ICD-10-CM | POA: Diagnosis present

## 2017-10-29 DIAGNOSIS — I10 Essential (primary) hypertension: Secondary | ICD-10-CM | POA: Diagnosis not present

## 2017-10-29 DIAGNOSIS — Z7901 Long term (current) use of anticoagulants: Secondary | ICD-10-CM | POA: Diagnosis not present

## 2017-10-29 DIAGNOSIS — I361 Nonrheumatic tricuspid (valve) insufficiency: Secondary | ICD-10-CM | POA: Diagnosis not present

## 2017-10-29 DIAGNOSIS — E7800 Pure hypercholesterolemia, unspecified: Secondary | ICD-10-CM | POA: Diagnosis present

## 2017-10-29 DIAGNOSIS — I953 Hypotension of hemodialysis: Secondary | ICD-10-CM | POA: Diagnosis not present

## 2017-10-29 DIAGNOSIS — I5022 Chronic systolic (congestive) heart failure: Secondary | ICD-10-CM

## 2017-10-29 DIAGNOSIS — N186 End stage renal disease: Secondary | ICD-10-CM | POA: Diagnosis present

## 2017-10-29 DIAGNOSIS — Z992 Dependence on renal dialysis: Secondary | ICD-10-CM

## 2017-10-29 DIAGNOSIS — N2581 Secondary hyperparathyroidism of renal origin: Secondary | ICD-10-CM | POA: Diagnosis present

## 2017-10-29 DIAGNOSIS — Z452 Encounter for adjustment and management of vascular access device: Secondary | ICD-10-CM

## 2017-10-29 DIAGNOSIS — Z955 Presence of coronary angioplasty implant and graft: Secondary | ICD-10-CM

## 2017-10-29 LAB — GLUCOSE, CAPILLARY
GLUCOSE-CAPILLARY: 150 mg/dL — AB (ref 65–99)
GLUCOSE-CAPILLARY: 128 mg/dL — AB (ref 65–99)
Glucose-Capillary: 89 mg/dL (ref 65–99)

## 2017-10-29 LAB — CBC WITH DIFFERENTIAL/PLATELET
BASOS PCT: 0 %
Basophils Absolute: 0 10*3/uL (ref 0.0–0.1)
EOS ABS: 0.2 10*3/uL (ref 0.0–0.7)
EOS PCT: 3 %
HCT: 36.5 % — ABNORMAL LOW (ref 39.0–52.0)
Hemoglobin: 12.1 g/dL — ABNORMAL LOW (ref 13.0–17.0)
LYMPHS ABS: 1.4 10*3/uL (ref 0.7–4.0)
Lymphocytes Relative: 24 %
MCH: 28.1 pg (ref 26.0–34.0)
MCHC: 33.2 g/dL (ref 30.0–36.0)
MCV: 84.9 fL (ref 78.0–100.0)
Monocytes Absolute: 0.5 10*3/uL (ref 0.1–1.0)
Monocytes Relative: 8 %
Neutro Abs: 4 10*3/uL (ref 1.7–7.7)
Neutrophils Relative %: 65 %
PLATELETS: 120 10*3/uL — AB (ref 150–400)
RBC: 4.3 MIL/uL (ref 4.22–5.81)
RDW: 18 % — ABNORMAL HIGH (ref 11.5–15.5)
WBC: 6.1 10*3/uL (ref 4.0–10.5)

## 2017-10-29 LAB — COMPREHENSIVE METABOLIC PANEL
ALK PHOS: 101 U/L (ref 38–126)
ALT: 35 U/L (ref 17–63)
AST: 33 U/L (ref 15–41)
Albumin: 3 g/dL — ABNORMAL LOW (ref 3.5–5.0)
Anion gap: 13 (ref 5–15)
BUN: 56 mg/dL — ABNORMAL HIGH (ref 6–20)
CALCIUM: 8.6 mg/dL — AB (ref 8.9–10.3)
CO2: 21 mmol/L — ABNORMAL LOW (ref 22–32)
CREATININE: 7.18 mg/dL — AB (ref 0.61–1.24)
Chloride: 99 mmol/L — ABNORMAL LOW (ref 101–111)
GFR calc non Af Amer: 8 mL/min — ABNORMAL LOW (ref >60)
GFR, EST AFRICAN AMERICAN: 9 mL/min — AB (ref >60)
Glucose, Bld: 183 mg/dL — ABNORMAL HIGH (ref 65–99)
Potassium: 4.4 mmol/L (ref 3.5–5.1)
Sodium: 133 mmol/L — ABNORMAL LOW (ref 135–145)
Total Bilirubin: 0.4 mg/dL (ref 0.3–1.2)
Total Protein: 6.6 g/dL (ref 6.5–8.1)

## 2017-10-29 LAB — MRSA PCR SCREENING: MRSA BY PCR: NEGATIVE

## 2017-10-29 LAB — LACTATE DEHYDROGENASE: LDH: 206 U/L — AB (ref 98–192)

## 2017-10-29 LAB — PROTIME-INR
INR: 1.86
Prothrombin Time: 21.3 seconds — ABNORMAL HIGH (ref 11.4–15.2)

## 2017-10-29 LAB — MAGNESIUM: MAGNESIUM: 1.8 mg/dL (ref 1.7–2.4)

## 2017-10-29 MED ORDER — DRONABINOL 2.5 MG PO CAPS
2.5000 mg | ORAL_CAPSULE | Freq: Two times a day (BID) | ORAL | Status: DC
  Administered 2017-10-29 – 2017-11-04 (×12): 2.5 mg via ORAL
  Filled 2017-10-29 (×12): qty 1

## 2017-10-29 MED ORDER — DOCUSATE SODIUM 100 MG PO CAPS
200.0000 mg | ORAL_CAPSULE | Freq: Every day | ORAL | Status: DC
  Administered 2017-10-29 – 2017-11-04 (×5): 200 mg via ORAL
  Filled 2017-10-29 (×6): qty 2

## 2017-10-29 MED ORDER — ATORVASTATIN CALCIUM 40 MG PO TABS
40.0000 mg | ORAL_TABLET | Freq: Every day | ORAL | Status: DC
  Administered 2017-10-29 – 2017-11-03 (×5): 40 mg via ORAL
  Filled 2017-10-29 (×5): qty 1

## 2017-10-29 MED ORDER — ONDANSETRON 4 MG PO TBDP: 4.0000 mg | ORAL_TABLET | Freq: Four times a day (QID) | ORAL | Status: DC | PRN

## 2017-10-29 MED ORDER — LORAZEPAM 0.5 MG PO TABS
0.5000 mg | ORAL_TABLET | Freq: Two times a day (BID) | ORAL | Status: DC | PRN
  Filled 2017-10-29: qty 1

## 2017-10-29 MED ORDER — PNEUMOCOCCAL VAC POLYVALENT 25 MCG/0.5ML IJ INJ: 0.5000 mL | INJECTION | INTRAMUSCULAR | Status: DC

## 2017-10-29 MED ORDER — HEPARIN (PORCINE) IN NACL 100-0.45 UNIT/ML-% IJ SOLN
1200.0000 [IU]/h | INTRAMUSCULAR | Status: DC
  Administered 2017-10-29 – 2017-10-30 (×2): 1200 [IU]/h via INTRAVENOUS
  Filled 2017-10-29 (×2): qty 250

## 2017-10-29 MED ORDER — NEPRO/CARBSTEADY PO LIQD
237.0000 mL | Freq: Three times a day (TID) | ORAL | Status: DC
  Administered 2017-10-30 – 2017-11-04 (×9): 237 mL via ORAL
  Filled 2017-10-29 (×23): qty 237

## 2017-10-29 MED ORDER — ACETAMINOPHEN 325 MG PO TABS
650.0000 mg | ORAL_TABLET | ORAL | Status: DC | PRN
Start: 2017-10-29 — End: 2017-11-04
  Administered 2017-11-01: 650 mg via ORAL
  Filled 2017-10-29: qty 2

## 2017-10-29 MED ORDER — INSULIN GLARGINE 100 UNIT/ML ~~LOC~~ SOLN
5.0000 [IU] | Freq: Every day | SUBCUTANEOUS | Status: DC
  Administered 2017-10-29 – 2017-11-04 (×6): 5 [IU] via SUBCUTANEOUS
  Filled 2017-10-29 (×8): qty 0.05

## 2017-10-29 MED ORDER — HYDRALAZINE HCL 25 MG PO TABS
37.5000 mg | ORAL_TABLET | Freq: Three times a day (TID) | ORAL | Status: DC
  Administered 2017-10-29 – 2017-10-31 (×7): 37.5 mg via ORAL
  Filled 2017-10-29 (×10): qty 1.5

## 2017-10-29 MED ORDER — PROMETHAZINE HCL 25 MG PO TABS
25.0000 mg | ORAL_TABLET | Freq: Four times a day (QID) | ORAL | Status: DC | PRN
  Administered 2017-10-31: 25 mg via ORAL
  Filled 2017-10-29: qty 1

## 2017-10-29 MED ORDER — ASPIRIN EC 81 MG PO TBEC
81.0000 mg | DELAYED_RELEASE_TABLET | Freq: Every day | ORAL | Status: DC
  Administered 2017-10-29 – 2017-11-04 (×6): 81 mg via ORAL
  Filled 2017-10-29 (×6): qty 1

## 2017-10-29 MED ORDER — METOCLOPRAMIDE HCL 10 MG PO TABS
10.0000 mg | ORAL_TABLET | Freq: Three times a day (TID) | ORAL | Status: DC
  Administered 2017-10-29 – 2017-10-30 (×4): 10 mg via ORAL
  Filled 2017-10-29 (×5): qty 1

## 2017-10-29 MED ORDER — INSULIN ASPART 100 UNIT/ML ~~LOC~~ SOLN
5.0000 [IU] | Freq: Three times a day (TID) | SUBCUTANEOUS | Status: DC
  Administered 2017-10-29 – 2017-11-04 (×13): 5 [IU] via SUBCUTANEOUS

## 2017-10-29 MED ORDER — PANTOPRAZOLE SODIUM 40 MG PO TBEC
40.0000 mg | DELAYED_RELEASE_TABLET | Freq: Every day | ORAL | Status: DC
  Administered 2017-10-29 – 2017-11-04 (×6): 40 mg via ORAL
  Filled 2017-10-29 (×6): qty 1

## 2017-10-29 MED ORDER — ONDANSETRON HCL 4 MG/2ML IJ SOLN
4.0000 mg | Freq: Four times a day (QID) | INTRAMUSCULAR | Status: DC | PRN
  Administered 2017-10-30: 4 mg via INTRAVENOUS
  Filled 2017-10-29: qty 2

## 2017-10-29 MED ORDER — AMLODIPINE BESYLATE 5 MG PO TABS
5.0000 mg | ORAL_TABLET | Freq: Every day | ORAL | Status: DC
  Administered 2017-10-29 – 2017-10-30 (×2): 5 mg via ORAL
  Filled 2017-10-29 (×2): qty 1

## 2017-10-29 MED ORDER — SILDENAFIL CITRATE 20 MG PO TABS
20.0000 mg | ORAL_TABLET | Freq: Three times a day (TID) | ORAL | Status: DC
  Administered 2017-10-29 – 2017-11-04 (×17): 20 mg via ORAL
  Filled 2017-10-29 (×17): qty 1

## 2017-10-29 MED ORDER — INFLUENZA VAC SPLIT QUAD 0.5 ML IM SUSY: 0.5000 mL | PREFILLED_SYRINGE | INTRAMUSCULAR | Status: DC

## 2017-10-29 NOTE — Procedures (Signed)
Central line placement  A central line was placed due to failure of peripheral access. After obtaining informed consent and performing a time out, the skin of the left neck was extensively prepped with chlorhexidine. The area was widely draped, sterile garb donned, and the left IJ cannulated sterilely under ultrasound guidance.   A wire was easily passed, te tract dilated and a 20 cm 7FR triple lumen catheter placed to 20 cm. There was good flow from all ports.   CXR shows good placement and no pneumothorax. The procedure was well tolerated.

## 2017-10-29 NOTE — Progress Notes (Signed)
[facility] for heparin Indication: LVAD  Allergies  Allergen Reactions  . Metformin And Related Diarrhea    Patient Measurements: Height: 5' 8"  (172.7 cm) Weight: 168 lb 6.9 oz (76.4 kg) IBW/kg (Calculated) : 68.4 Heparin Dosing Weight: 76kg  Vital Signs: Temp: 97.8 F (36.6 C) (02/24 1600) Temp Source: Oral (02/24 1600) BP: 110/97 (02/24 1600) Pulse Rate: 90 (02/24 1600)  Labs: Recent Labs    10/27/17 1131 10/29/17 1049 10/29/17 1400  HGB  --  12.1*  --   HCT  --  36.5*  --   PLT  --  120*  --   LABPROT 25.0*  --  21.3*  INR 2.28  --  1.86  CREATININE  --   --  7.18*    Estimated Creatinine Clearance: 11.5 mL/min (A) (by C-G formula based on SCr of 7.18 mg/dL (H)).   Medical History: Past Medical History:  Diagnosis Date  . Angina   . ASCVD (arteriosclerotic cardiovascular disease)    , Anterior infarction 2005, LAD diagonal bifurcation intervention 03/2004  . Automatic implantable cardiac defibrillator -St. Jude's       . Benign neoplasm of colon   . CHF (congestive heart failure) ([place])   . Chronic systolic heart failure ([place])   . Coronary artery disease     Widely patent previously placed stents in the left anterior   . Crohn's disease ([place])   . 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 Dialysis patient ([place])   . Dyspnea   . Gastroparesis   . GERD (gastroesophageal reflux disease)   . High cholesterol   . Hyperlipidemia   . Hypersomnolent    Previous diagnosis of narcolepsy  . Hypertension, essential   . Ischemic cardiomyopathy    Ejection fraction 15-20% catheterization 2010  . Type II or unspecified type diabetes mellitus without mention of complication, not stated as uncontrolled   . Unspecified gastritis and gastroduodenitis without mention of hemorrhage      Assessment: 7 yoM s/p Heartmate 3 LVAD 06/2017 admitted for heparin bridge with planned HD vascular access procedure early next  week. Last dose of warfarin was 2/22 and INR was 2.28 at that time. Heparin to start when INR < 2.0 -INR= 1.86  Goal of Therapy:  Heparin level 0.3-0.7 units/ml Monitor platelets by anticoagulation protocol: Yes   Plan:  -Start heparin at 1200 units/hr -Heparin level in 8 hours and daily wth CBC daily  Hildred Laser, Pharm D 10/29/2017 4:51 PM

## 2017-10-29 NOTE — Progress Notes (Signed)
ANTICOAGULATION CONSULT NOTE - Initial Consult  Pharmacy Consult for heparin Indication: LVAD  Allergies  Allergen Reactions  . Metformin And Related Diarrhea    Patient Measurements: Height: 5' 8"  (172.7 cm) Weight: 168 lb 6.9 oz (76.4 kg) IBW/kg (Calculated) : 68.4 Heparin Dosing Weight: 76kg  Vital Signs: Temp: 97.6 F (36.4 C) (02/24 1154) Temp Source: Oral (02/24 1154) BP: 121/92 (02/24 1154)  Labs: Recent Labs    10/27/17 1131 10/29/17 1049  HGB  --  12.1*  HCT  --  36.5*  PLT  --  120*  LABPROT 25.0*  --   INR 2.28  --     Estimated Creatinine Clearance: 15.4 mL/min (A) (by C-G formula based on SCr of 5.35 mg/dL (H)).   Medical History: Past Medical History:  Diagnosis Date  . Angina   . ASCVD (arteriosclerotic cardiovascular disease)    , Anterior infarction 2005, LAD diagonal bifurcation intervention 03/2004  . Automatic implantable cardiac defibrillator -St. Jude's       . Benign neoplasm of colon   . CHF (congestive heart failure) ([place])   . Chronic systolic heart failure ([place])   . Coronary artery disease     Widely patent previously placed stents in the left anterior   . Crohn's disease ([place])   . 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 Dialysis patient ([place])   . Dyspnea   . Gastroparesis   . GERD (gastroesophageal reflux disease)   . High cholesterol   . Hyperlipidemia   . Hypersomnolent    Previous diagnosis of narcolepsy  . Hypertension, essential   . Ischemic cardiomyopathy    Ejection fraction 15-20% catheterization 2010  . Type II or unspecified type diabetes mellitus without mention of complication, not stated as uncontrolled   . Unspecified gastritis and gastroduodenitis without mention of hemorrhage      Assessment: 62 yoM s/p Heartmate 3 LVAD 06/2017 admitted for heparin bridge with planned HD vascular access procedure early next week. Last dose of warfarin was 2/22 and INR was 2.28 at that time. Labs pending  with difficult lab draws, will start heparin once INR is <2.0.   Goal of Therapy:  Heparin level 0.3-0.7 units/ml Monitor platelets by anticoagulation protocol: Yes   Plan:  -Hold warfarin -INR stat, heparin once INR <2.0  Arrie Senate, PharmD, BCPS PGY-2 Cardiology Pharmacy Resident Pager: [phone] 10/29/2017

## 2017-10-29 NOTE — Progress Notes (Signed)
   Patient with LVAD admitted for heparin bridge prior to planned left arm av graft on Tuesday 2.26. If there are truly no iv access options then he will consideration of central venous access but avoid picc line. Will let Dr. Bridgett Larsson of patient admission.  Shanekia Latella C. Donzetta Matters, MD [facility] Office: [phone] Pager: [phone]

## 2017-10-30 ENCOUNTER — Other Ambulatory Visit: Payer: Self-pay

## 2017-10-30 ENCOUNTER — Encounter (HOSPITAL_COMMUNITY): Payer: Self-pay | Admitting: *Deleted

## 2017-10-30 DIAGNOSIS — I5022 Chronic systolic (congestive) heart failure: Secondary | ICD-10-CM

## 2017-10-30 LAB — CBC
HCT: 33.9 % — ABNORMAL LOW (ref 39.0–52.0)
Hemoglobin: 10.7 g/dL — ABNORMAL LOW (ref 13.0–17.0)
MCH: 26.6 pg (ref 26.0–34.0)
MCHC: 31.6 g/dL (ref 30.0–36.0)
MCV: 84.3 fL (ref 78.0–100.0)
PLATELETS: 182 10*3/uL (ref 150–400)
RBC: 4.02 MIL/uL — AB (ref 4.22–5.81)
RDW: 17.4 % — ABNORMAL HIGH (ref 11.5–15.5)
WBC: 7.8 10*3/uL (ref 4.0–10.5)

## 2017-10-30 LAB — BASIC METABOLIC PANEL
Anion gap: 12 (ref 5–15)
BUN: 67 mg/dL — ABNORMAL HIGH (ref 6–20)
CO2: 23 mmol/L (ref 22–32)
Calcium: 8.6 mg/dL — ABNORMAL LOW (ref 8.9–10.3)
Chloride: 97 mmol/L — ABNORMAL LOW (ref 101–111)
Creatinine, Ser: 7.66 mg/dL — ABNORMAL HIGH (ref 0.61–1.24)
GFR calc Af Amer: 8 mL/min — ABNORMAL LOW (ref >60)
GFR, EST NON AFRICAN AMERICAN: 7 mL/min — AB (ref >60)
GLUCOSE: 151 mg/dL — AB (ref 65–99)
Potassium: 4.8 mmol/L (ref 3.5–5.1)
Sodium: 132 mmol/L — ABNORMAL LOW (ref 135–145)

## 2017-10-30 LAB — GLUCOSE, CAPILLARY
GLUCOSE-CAPILLARY: 99 mg/dL (ref 65–99)
GLUCOSE-CAPILLARY: 130 mg/dL — AB (ref 65–99)
Glucose-Capillary: 131 mg/dL — ABNORMAL HIGH (ref 65–99)
Glucose-Capillary: 137 mg/dL — ABNORMAL HIGH (ref 65–99)

## 2017-10-30 LAB — TROPONIN I: Troponin I: 0.07 ng/mL (ref <0.03)

## 2017-10-30 LAB — PROTIME-INR
INR: 1.77
Prothrombin Time: 20.5 seconds — ABNORMAL HIGH (ref 11.4–15.2)

## 2017-10-30 LAB — LACTATE DEHYDROGENASE: LDH: 207 U/L — AB (ref 98–192)

## 2017-10-30 LAB — HEPARIN LEVEL (UNFRACTIONATED)
HEPARIN UNFRACTIONATED: 0.58 [IU]/mL (ref 0.30–0.70)
Heparin Unfractionated: 0.42 IU/mL (ref 0.30–0.70)

## 2017-10-30 MED ORDER — SODIUM CHLORIDE 0.9 % IV SOLN: 100.0000 mL | INTRAVENOUS | Status: DC | PRN

## 2017-10-30 MED ORDER — PENTAFLUOROPROP-TETRAFLUOROETH EX AERO
1.0000 "application " | INHALATION_SPRAY | CUTANEOUS | Status: DC | PRN
  Filled 2017-10-30: qty 30

## 2017-10-30 MED ORDER — CHLORHEXIDINE GLUCONATE 4 % EX LIQD: 60.0000 mL | Freq: Once | CUTANEOUS | Status: DC

## 2017-10-30 MED ORDER — LIDOCAINE-PRILOCAINE 2.5-2.5 % EX CREA: 1.0000 "application " | TOPICAL_CREAM | CUTANEOUS | Status: DC | PRN

## 2017-10-30 MED ORDER — CALCITRIOL 0.25 MCG PO CAPS
0.5000 ug | ORAL_CAPSULE | ORAL | Status: DC
  Administered 2017-10-30 – 2017-11-03 (×4): 0.5 ug via ORAL
  Filled 2017-10-30 (×3): qty 2

## 2017-10-30 MED ORDER — CHLORHEXIDINE GLUCONATE 4 % EX LIQD
60.0000 mL | Freq: Once | CUTANEOUS | Status: AC
  Administered 2017-10-31: 4 via TOPICAL
  Filled 2017-10-30: qty 60

## 2017-10-30 MED ORDER — MORPHINE SULFATE (PF) 2 MG/ML IV SOLN
2.0000 mg | Freq: Once | INTRAVENOUS | Status: AC
  Administered 2017-10-30: 2 mg via INTRAVENOUS

## 2017-10-30 MED ORDER — HEPARIN SODIUM (PORCINE) 1000 UNIT/ML DIALYSIS: 1000.0000 [IU] | INTRAMUSCULAR | Status: DC | PRN

## 2017-10-30 MED ORDER — ALTEPLASE 2 MG IJ SOLR: 2.0000 mg | Freq: Once | INTRAMUSCULAR | Status: DC | PRN

## 2017-10-30 MED ORDER — PROMETHAZINE HCL 25 MG/ML IJ SOLN: 12.5000 mg | Freq: Four times a day (QID) | INTRAMUSCULAR | Status: DC | PRN

## 2017-10-30 MED ORDER — PROMETHAZINE HCL 25 MG/ML IJ SOLN
12.5000 mg | Freq: Four times a day (QID) | INTRAMUSCULAR | Status: DC | PRN
  Administered 2017-10-30: 12.5 mg via INTRAVENOUS
  Filled 2017-10-30: qty 1

## 2017-10-30 MED ORDER — MORPHINE SULFATE (PF) 2 MG/ML IV SOLN
INTRAVENOUS | Status: AC
  Filled 2017-10-30: qty 1

## 2017-10-30 MED ORDER — LIDOCAINE HCL (PF) 1 % IJ SOLN
5.0000 mL | INTRAMUSCULAR | Status: DC | PRN
Start: 2017-10-30 — End: 2017-11-04

## 2017-10-30 MED ORDER — METOCLOPRAMIDE HCL 5 MG/ML IJ SOLN
10.0000 mg | Freq: Four times a day (QID) | INTRAMUSCULAR | Status: DC
  Administered 2017-10-30 – 2017-11-03 (×13): 10 mg via INTRAVENOUS
  Filled 2017-10-30 (×13): qty 2

## 2017-10-30 NOTE — Progress Notes (Signed)
Called to pts room by dialysis nurse, pt is stating that he is having chest pain, that came on suddenly, VSS, 12 lead being obtained, on call cardiologist paged, will monitor.

## 2017-10-30 NOTE — Progress Notes (Signed)
LVAD Coordinator Rounding Note:  Admitted 10/29/17 for heparin bridge for AV graft placement on 10/31/17 per Dr. Bridgett Larsson.    HM III LVAD implanted on 06/12/17 by Dr. Prescott Gum under Destination Therapy criteria due to renal insufficiency.  Pt has required dialysis 3 times weekly on MWF on OP basis. His initial right arm fistula failed and he will be having a left arm AV graft placed tomorrow am, 7:30 case per Dr. Bridgett Larsson. VAD Coordinator will accompany patient to surgery.  Vital signs: Temp: 98.0 HR: 98 Doppler Pressure: 96 Automatic BP:  105/91 (98) O2 Sat: 98% RA Wt: 168>170 lbs   LVAD interrogation reveals:   Speed: 5600 Flow: 4.4 Power: 4.2w PI: 3.1 Alarms: none Events:  >110 on 10/29/17 Hematocrit: 33 Fixed speed:  5600 Low speed limit: 5300  Drive Line: left abdominal dressing dry and intact. Pt is having weekly dressing changes. Due today.  Existing VAD dressing removed and site care performed using sterile technique. Drive line exit site cleaned with Chlora prep applicators x 2, allowed to dry, and Sorbaview dressing with bio patch re-applied. Exit site healed and incorporated, the velour is fully implanted at exit site. Exit site with dry exudate, removed with cleansing. No redness, tenderness, drainage, foul odor or rash noted. Drive line anchor re-applied.    Labs:  LDH trend: 206>207  INR trend: 1.86>1.77  Anticoagulation Plan: -INR Goal:  2.28 -ASA Dose: 81 mg daily  Device: - St Jude dual - Therapies: on at 200 bpm    Monitor: on slow VT 150 bpm  Adverse Events on VAD: - ESRD post LVAD; started CVVH 06/15/17; HD started 06/20/17 - Hospitalization 11/5 - 07/16/17 > gastroparesis diagnosis after EGD - Hospitalization 11/26 - 08/04/17 > gastroparesis - Hospitalization 1/8 - 09/16/17>gastroparesis - [facility] to Dr[facility] Corliss [facility]   Plan/Recommendations:   1. Weekly dressing changes; next due 11/06/17. 2. Left arm AV graft scheduled tomorrow at 7:30 per Dr. Bridgett Larsson.  VAD coordinator will accompany patient to OR. 3. Please page VAD coordinator for any VAD equipment or driveline issues.   Zada Girt RN, VAD Coordinator 24/7 VAD Pager: [phone]

## 2017-10-30 NOTE — Consult Note (Signed)
Reason for Consult:  Continuity of ESRD care Referring Physician: Loralie Champagne M.D. (Cardiology/heart failure service)  HPI:  52 year old African-American man with history of end-stage renal disease on hemodialysis, ischemic cardiomyopathy with LVAD, coronary artery disease, type 2 diabetes mellitus, gastroesophageal reflux disease and gastroparesis who is admitted to the hospital for heparin bridging off of Coumadin to allow for ligation of his right basilic vein transposition fistula with planned left upper arm AV graft placement.  He denies any complaints including chest pain or shortness of breath. He denies any nausea, vomiting or diarrhea and does not have any hematemesis or melena. He denies any dysuria, urgency, frequency, flank pain, fever or chills.  Hemodialysis prescription: Monday/Wednesday/Friday, [facility], 4 hours 15 minutes, 180 dialyzer, BFR 400, DFR 800, EDW 77 kg, 3K/2.0 calcium, no UF profile, no sodium modeling, right IJ TDC, heparin 8000 unit bolus, Venofer 100 mg IV every HD, calcitriol 0.5 g 3 times a week at dialysis  Past Medical History:  Diagnosis Date  . Angina   . ASCVD (arteriosclerotic cardiovascular disease)    , Anterior infarction 2005, LAD diagonal bifurcation intervention 03/2004  . Automatic implantable cardiac defibrillator -St. Jude's       . Benign neoplasm of colon   . CHF (congestive heart failure) ([place])   . Chronic systolic heart failure ([place])   . Coronary artery disease     Widely patent previously placed stents in the left anterior   . Crohn's disease ([place])   . 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 Dialysis patient ([place])   . Dyspnea   . Gastroparesis   . GERD (gastroesophageal reflux disease)   . High cholesterol   . Hyperlipidemia   . Hypersomnolent    Previous diagnosis of narcolepsy  . Hypertension, essential   . Ischemic cardiomyopathy    Ejection fraction 15-20% catheterization 2010  . Type II  or unspecified type diabetes mellitus without mention of complication, not stated as uncontrolled   . Unspecified gastritis and gastroduodenitis without mention of hemorrhage     Past Surgical History:  Procedure Laterality Date  . APPENDECTOMY    . AV FISTULA PLACEMENT Right 06/26/2017   Procedure: ARTERIOVENOUS (AV) FISTULA CREATION VERSUS GRAFT RIGHT  ARM;  Surgeon: Conrad Franklin, MD;  Location: [place];  Service: Vascular;  Laterality: Right;  . CARDIAC DEFIBRILLATOR PLACEMENT  2010   St. Jude ICD  . COLECTOMY  ~ 2003   "for Crohn's" ascending colon.    . CORONARY STENT INTERVENTION N/A 11/25/2016   Procedure: Coronary Stent Intervention;  Surgeon: Leonie Man, MD;  Location: [place] [facility];  Service: Cardiovascular;  Laterality: N/A;  . EP IMPLANTABLE DEVICE N/A 09/14/2016   Procedure: ICD Generator Changeout;  Surgeon: Deboraha Sprang, MD;  Location: [place] [facility];  Service: Cardiovascular;  Laterality: N/A;  . ESOPHAGOGASTRODUODENOSCOPY N/A 07/12/2017   Procedure: ESOPHAGOGASTRODUODENOSCOPY (EGD);  Surgeon: Jerene Bears, MD;  Location: [facility] ENDOS[facility];  Service: Gastroenterology;  Laterality: N/A;  VAD pt so VAD team will need to accompany pt.   [name] FETAL SURGERY FOR CONGENITAL HERNIA     ????? pt has no knowledge of this..    . INGUINAL HERNIA REPAIR    . INSERTION OF DIALYSIS CATHETER Right 06/26/2017   Procedure: INSERTION OF TUNNELED  DIALYSIS CATHETER RIGHT INTERNAL JUGULAR;  Surgeon: Conrad [place], MD;  Location: [place];  Service: Vascular;  Laterality: Right;  . INSERTION OF IMPLANTABLE LEFT VENTRICULAR ASSIST  DEVICE N/A 06/12/2017   Procedure: INSERTION OF IMPLANTABLE LEFT VENTRICULAR ASSIST [place] 3;  Surgeon: Ivin Poot, MD;  Location: [place];  Service: Open Heart Surgery;  Laterality: N/A;  HM3 LVAD  CIRC ARREST  NITRIC OXIDE  . IR THORACENTESIS ASP PLEURAL SPACE W/IMG GUIDE  06/28/2017  . MULTIPLE EXTRACTIONS WITH ALVEOLOPLASTY N/A 06/08/2017    Procedure: MULTIPLE EXTRACTION WITH ALVEOLOPLASTY AND PRE PROSTHETIC SURGERY AS NEEDED;  Surgeon: Lenn Cal, DDS;  Location: [place];  Service: Oral Surgery;  Laterality: N/A;  . RIGHT HEART CATH N/A 06/07/2017   Procedure: RIGHT HEART CATH;  Surgeon: Larey Dresser, MD;  Location: [place] [facility];  Service: Cardiovascular;  Laterality: N/A;  . RIGHT/LEFT HEART CATH AND CORONARY ANGIOGRAPHY N/A 11/23/2016   Procedure: Right/Left Heart Cath and Coronary Angiography;  Surgeon: Larey Dresser, MD;  Location: [place] [facility];  Service: Cardiovascular;  Laterality: N/A;  . TEE WITHOUT CARDIOVERSION N/A 06/12/2017   Procedure: TRANSESOPHAGEAL ECHOCARDIOGRAM (TEE);  Surgeon: Prescott Gum, Collier Salina, MD;  Location: [place];  Service: Open Heart Surgery;  Laterality: N/A;    Family History  Problem Relation Age of Onset  . Colon polyps Mother   . Diabetes Mother   . Heart attack Father   . Diabetes Father   . Stroke Paternal Grandfather   . Colitis Sister   . Heart disease Brother   . Colitis Sister   . Colon cancer Neg Hx     Social History:  reports that  has never smoked. he has never used smokeless tobacco. He reports that he does not drink alcohol or use drugs.  Allergies:  Allergies  Allergen Reactions  . Metformin And Related Diarrhea    Medications:  Scheduled: . amLODipine  5 mg Oral Daily  . aspirin EC  81 mg Oral Daily  . atorvastatin  40 mg Oral q1800  . docusate sodium  200 mg Oral Daily  . dronabinol  2.5 mg Oral BID AC  . feeding supplement (NEPRO CARB STEADY)  237 mL Oral TID WC  . hydrALAZINE  37.5 mg Oral TID  . Influenza vac split quadrivalent PF  0.5 mL Intramuscular Tomorrow-1000  . insulin aspart  5 Units Subcutaneous TID WC  . insulin glargine  5 Units Subcutaneous Daily  . metoCLOPramide  10 mg Oral TID  . pantoprazole  40 mg Oral Daily  . pneumococcal 23 valent vaccine  0.5 mL Intramuscular Tomorrow-1000  . sildenafil  20 mg Oral TID    BMP Latest  Ref Rng & Units 10/30/2017 10/29/2017 10/24/2017  Glucose 65 - 99 mg/dL 151(H) 183(H) 249(H)  BUN 6 - 20 mg/dL 67(H) 56(H) 32(H)  Creatinine 0.61 - 1.24 mg/dL 7.66(H) 7.18(H) 5.35(H)  BUN/Creat Ratio 9 - 20 - - -  Sodium 135 - 145 mmol/L 132(L) 133(L) 136  Potassium 3.5 - 5.1 mmol/L 4.8 4.4 4.8  Chloride 101 - 111 mmol/L 97(L) 99(L) 100(L)  CO2 22 - 32 mmol/L 23 21(L) 20(L)  Calcium 8.9 - 10.3 mg/dL 8.6(L) 8.6(L) 9.0   CBC Latest Ref Rng & Units 10/30/2017 10/29/2017 10/24/2017  WBC 4.0 - 10.5 K/uL 7.8 6.1 5.9  Hemoglobin 13.0 - 17.0 g/dL 10.7(L) 12.1(L) 13.2  Hematocrit 39.0 - 52.0 % 33.9(L) 36.5(L) 41.2  Platelets 150 - 400 K/uL 182 120(L) 214     Dg Chest Port 1 View  Result Date: 10/29/2017 CLINICAL DATA:  Central line placement. EXAM: PORTABLE CHEST 1 VIEW COMPARISON:  07/31/2017 and prior radiographs  FINDINGS: A LEFT IJ central venous catheter is now noted with tip overlying the LOWER SVC. A RIGHT IJ central venous catheter is again noted with tip overlying the RIGHT atrium. Cardiomegaly, LVAD and LEFT-sided pacemaker/ICD again noted. There is no evidence of pneumothorax. Improved aeration in the LOWER lungs noted. IMPRESSION: LEFT IJ central venous catheter placement with tip overlying the LOWER SVC. No evidence of pneumothorax. Improved bibasilar aeration. Electronically Signed   By: Margarette Canada M.D.   On: 10/29/2017 15:51    Review of Systems  Constitutional: Negative.   HENT: Negative.   Eyes: Negative.   Respiratory: Negative.   Cardiovascular: Negative.   Gastrointestinal: Negative.   Genitourinary: Negative.   Musculoskeletal: Negative.   Skin: Negative.   Neurological: Negative.    Blood pressure 97/77, pulse 98, temperature 98 F (36.7 C), temperature source Oral, resp. rate 18, height 5' 8"  (1.727 m), weight 170 lb 10.2 oz (77.4 kg), SpO2 98 %. Physical Exam  Nursing note and vitals reviewed. Constitutional: He is oriented to person, place, and time. He appears  well-developed and well-nourished. No distress.  HENT:  Head: Normocephalic and atraumatic.  Mouth/Throat: Oropharynx is clear and moist. No oropharyngeal exudate.  Eyes: Conjunctivae are normal. Pupils are equal, round, and reactive to light. No scleral icterus.  Neck: Normal range of motion. Neck supple. No JVD present.  Cardiovascular: Normal rate.  Mechanical hum over precordium  Respiratory: Effort normal and breath sounds normal. He has no wheezes. He has no rales.  LVAD tubing in situ  GI: Soft. Bowel sounds are normal. There is no tenderness. There is no rebound.  Musculoskeletal: Normal range of motion. He exhibits no edema.  Poor thrill over the right basilic vein transposition fistula with low pitched bruit  Neurological: He is alert and oriented to person, place, and time.  Skin: Skin is warm and dry.  Psychiatric: He has a normal mood and affect.    Assessment/Plan: 1. Chronic anticoagulation with LVAD device: Patient admitted for bridging of warfarin with heparin to allow for hemodialysis access surgery. 2. End-stage renal disease: Hemodialysis today per his usual outpatient Monday/Wednesday/Friday schedule. With his chronic hypotension and previously known small ultrafiltration goals, we'll be judicious with our target and set it at 1.5 L at this time. Hemodialysis will be done by the bedside in the cardiac stepdown unit. 3. Hypotension: Chronic, will maintain caution with ultrafiltration during dialysis and a visit antihypertensive agents. 4. Ischemic cardiomyopathy, coronary artery disease status post LVAD and BiV- ICD 5. Anemia of chronic kidney disease: Hemoglobin currently acceptable, continue to monitor for overt losses/ESA needs. 6. Secondary hyperparathyroidism: Continue renal diet/low phosphorus diet. Restart calcitriol with hemodialysis. Not on phosphorus binder.  Kaladin Noseworthy K. 10/30/2017, 11:19 AM

## 2017-10-30 NOTE — Progress Notes (Signed)
ANTICOAGULATION CONSULT NOTE - Follow Up Consult  Pharmacy Consult for Heparin Indication: LVAD  Allergies  Allergen Reactions  . Metformin And Related Diarrhea    Patient Measurements: Height: 5' 8"  (172.7 cm) Weight: 170 lb 10.2 oz (77.4 kg) IBW/kg (Calculated) : 68.4 Heparin Dosing Weight: 76kg  Vital Signs: Temp: 98 F (36.7 C) (02/25 1054) Temp Source: Oral (02/25 1054) BP: 97/77 (02/25 1054) Pulse Rate: 98 (02/25 0600)  Labs: Recent Labs    10/29/17 1049 10/29/17 1400 10/30/17 0145 10/30/17 1140  HGB 12.1*  --  10.7*  --   HCT 36.5*  --  33.9*  --   PLT 120*  --  182  --   LABPROT  --  21.3* 20.5*  --   INR  --  1.86 1.77  --   HEPARINUNFRC  --   --  0.42 0.58  CREATININE  --  7.18* 7.66*  --     Estimated Creatinine Clearance: 10.8 mL/min (A) (by C-G formula based on SCr of 7.66 mg/dL (H)).  Medications: Heparin @ 1200 units/hr  Assessment: 55 yoM s/p Heartmate 3 LVAD 06/2017 admitted for heparin bridge with planned HD vascular access procedure on Tuesday 2/26. Last dose of warfarin was 2/22 and INR was 2.28 at that time. Heparin started once INR < 2. Heparin level is therapeutic at 0.58. INR down to 1.77. CBC/LDH stable. No bleeding reported.  Goal of Therapy:  Heparin level 0.3-0.7 units/ml Monitor platelets by anticoagulation protocol: Yes   Plan:  1) Continue heparin at 1200 units/hr 2) Daily heparin level and CBC  Nena Jordan, PharmD, BCPS  10/30/2017 2:43 PM

## 2017-10-30 NOTE — Progress Notes (Addendum)
Patient ID: [name], male   DOB: 1966-08-11, 52 y.o.   MRN: [id number]   Advanced Heart Failure VAD Team Note  Cardiologist: Aundra Dubin  Subjective:    Had to get left IJ CVL, no vascular access. Heparin gtt started yesterday with INR < 2.    Doing well overnight, no problems.  No abdominal pain. No lightheadedness, MAP generally in 80s.   LVAD INTERROGATION:  HeartMate 3 LVAD:  Flow 4.3 liters/min, speed 5600, power 4.2, PI 3.4.  ~15 PIs/day   Objective:    Vital Signs:   Temp:  [97.6 F (36.4 C)-98.6 F (37 C)] 97.8 F (36.6 C) (02/25 0715) Pulse Rate:  [90-100] 98 (02/25 0600) Resp:  [15-20] 18 (02/25 0600) BP: (81-121)/(55-97) 97/72 (02/25 0600) SpO2:  [96 %-99 %] 98 % (02/25 0600) Weight:  [168 lb 6.9 oz (76.4 kg)-170 lb 10.2 oz (77.4 kg)] 170 lb 10.2 oz (77.4 kg) (02/25 0600) Last BM Date: 10/29/17(per pt) Mean arterial Pressure 80s  Intake/Output:   Intake/Output Summary (Last 24 hours) at 10/30/2017 0802 Last data filed at 10/30/2017 0400 Gross per 24 hour  Intake 1206 ml  Output -  Net 1206 ml     Physical Exam    General:  Well appearing. No resp difficulty HEENT: normal Neck: supple. JVP 8 cm. Carotids 2+ bilat; no bruits. No lymphadenopathy or thyromegaly appreciated. Cor: Mechanical heart sounds with LVAD hum present. Lungs: clear Abdomen: soft, nontender, nondistended. No hepatosplenomegaly. No bruits or masses. Good bowel sounds. Driveline: C/D/I; securement device intact and driveline incorporated Extremities: no cyanosis, clubbing, rash, edema Neuro: alert & orientedx3, cranial nerves grossly intact. moves all 4 extremities w/o difficulty. Affect pleasant   Telemetry   NSR, personally reviewed  Labs   Basic Metabolic Panel: Recent Labs  Lab 10/24/17 1000 10/29/17 1400 10/30/17 0145  NA 136 133* 132*  K 4.8 4.4 4.8  CL 100* 99* 97*  CO2 20* 21* 23  GLUCOSE 249* 183* 151*  BUN 32* 56* 67*  CREATININE 5.35* 7.18* 7.66*  CALCIUM  9.0 8.6* 8.6*  MG  --  1.8  --     Liver Function Tests: Recent Labs  Lab 10/29/17 1400  AST 33  ALT 35  ALKPHOS 101  BILITOT 0.4  PROT 6.6  ALBUMIN 3.0*   No results for input(s): LIPASE, AMYLASE in the last 168 hours. No results for input(s): AMMONIA in the last 168 hours.  CBC: Recent Labs  Lab 10/24/17 1000 10/29/17 1049 10/30/17 0145  WBC 5.9 6.1 7.8  NEUTROABS  --  4.0  --   HGB 13.2 12.1* 10.7*  HCT 41.2 36.5* 33.9*  MCV 86.0 84.9 84.3  PLT 214 120* 182    INR: Recent Labs  Lab 10/24/17 1000 10/27/17 1131 10/29/17 1400 10/30/17 0145  INR 2.20 2.28 1.86 1.77    Other results:  EKG:    Imaging   Dg Chest Port 1 View  Result Date: 10/29/2017 CLINICAL DATA:  Central line placement. EXAM: PORTABLE CHEST 1 VIEW COMPARISON:  07/31/2017 and prior radiographs FINDINGS: A LEFT IJ central venous catheter is now noted with tip overlying the LOWER SVC. A RIGHT IJ central venous catheter is again noted with tip overlying the RIGHT atrium. Cardiomegaly, LVAD and LEFT-sided pacemaker/ICD again noted. There is no evidence of pneumothorax. Improved aeration in the LOWER lungs noted. IMPRESSION: LEFT IJ central venous catheter placement with tip overlying the LOWER SVC. No evidence of pneumothorax. Improved bibasilar aeration. Electronically Signed   By:  Margarette Canada M.D.   On: 10/29/2017 15:51      Medications:     Scheduled Medications: . amLODipine  5 mg Oral Daily  . aspirin EC  81 mg Oral Daily  . atorvastatin  40 mg Oral q1800  . docusate sodium  200 mg Oral Daily  . dronabinol  2.5 mg Oral BID AC  . feeding supplement (NEPRO CARB STEADY)  237 mL Oral TID WC  . hydrALAZINE  37.5 mg Oral TID  . Influenza vac split quadrivalent PF  0.5 mL Intramuscular Tomorrow-1000  . insulin aspart  5 Units Subcutaneous TID WC  . insulin glargine  5 Units Subcutaneous Daily  . metoCLOPramide  10 mg Oral TID  . pantoprazole  40 mg Oral Daily  . pneumococcal 23 valent  vaccine  0.5 mL Intramuscular Tomorrow-1000  . sildenafil  20 mg Oral TID     Infusions: . heparin 1,200 Units/hr (10/29/17 2019)     PRN Medications:  acetaminophen, LORazepam, ondansetron (ZOFRAN) IV, ondansetron, promethazine    Assessment/Plan:    1 ESRD: Admit for AV graft placement and heparin bridge. VVS appreciated.  - Notify renal today, patient will need HD later today.  - Last dose of coumadin 2/22. Patient seen with PharmD at bedside. He is now on heparin gtt as bridge to surgery.  2. Chronic systolic HF: Ischemic cardiomyopathy. St Jude ICD. NSTEMI in March 2018 with DES to LAD and RCA, complicated by cardiogenic shock, low output requiring milrinone. Echo 8/18 with EF 20-25%, moderate MR. CPX (8/18) with severe functional impairment due to HF.  s/p HM-3 VAD in 10/18. NYHA II with VAD - Volume status managed with HD - VAD interrogated personally. Parameters stable. 3. CAD: NSTEMI in 3/18. LHC with 99% ulcerated lesion proximal RCA with left to right collaterals, 95% mid LAD stenosis after mid LAD stent. s/p PCI to RCA and LAD on 11/25/16.  - Continue aspirin and statin.  4. LVAD present - VAD interrogated personally. Parameters stable - Driveline ok - LDH stable.  - MAP primarily in 80s.  5. h/o DVTs: Factor V Leiden heterozygote. - On coumadin. Bridging with heparin  6. Diabetic gastroparesis:Difficult to manage.Continue Phenergan suppositories if nausea/vomiting returns. He has seen gastroparesis specialist at [facility] and workup is continuing. Symptoms currently stable.  - Continue reglan 5 mg tid/ac 7. OE:HOZY2Q is now well-controlled. 8. HTN: Continue home regimen for now.  9. PAD: Moderate bilateral disease on ABIs in 8/18. Denies claudication currently, no pedal ulcers.  10. Atrial Flutter-Paroxysmal  - in NSR today 11. Anemia: Hgb lower today at 10.7, no overt bleeding.  Follow.   I reviewed the LVAD parameters from today, and compared the  results to the patient's prior recorded data.  No programming changes were made.  The LVAD is functioning within specified parameters.  The patient performs LVAD self-test daily.  LVAD interrogation was negative for any significant power changes, alarms or PI events/speed drops.  LVAD equipment check completed and is in good working order.  Back-up equipment present.   LVAD education done on emergency procedures and precautions and reviewed exit site care.  Length of Stay: 1  Loralie Champagne, MD 10/30/2017, 8:02 AM  VAD Team --- VAD ISSUES ONLY--- Pager [phone] (7am - 7am)  Advanced Heart Failure Team  Pager [phone] (M-F; 7a - 4p)  Please contact [facility] for night-coverage after hours (4p -7a ) and weekends on [email or web address]

## 2017-10-30 NOTE — Progress Notes (Signed)
Paged on call cardiologist about pts chest pain per note of AHF team to call oncall cardiologist after 4pm, was told that due to this being a VAD patient they would not treat this patient and this RN would need to contact advanced heart failure team. Dr Aundra Dubin paged, made aware and new orders placed. Will continue to monitor patient.

## 2017-10-30 NOTE — Progress Notes (Addendum)
   Paged by RN concerned with patients new onset chest pain during dialysis today, 10/30/17. Reviewed pt chart and went to bedside. He is followed by Heart Failure/LVAD team and was seen by Dr. Aundra Dubin this AM. In the meantime, RN spoke with Dr. Aundra Dubin who ordered cardiac enzymes and a 12-lead EKG. Will be available this evening if needed.   Thank you Kathyrn Drown NP-C [facility] Pager: [phone]  Pt now w/ N&V Spoke w/ Dr Aundra Dubin and called RN back. Ok to use Phenergan IV, can give up to 25 mg per dose prn Will change Reglan to IV (missed 6 pm dose of oral rx due to sx) and give 10 mg IV q 6 h for now.   F/u in am.  Rosaria Ferries, PA-C 10/30/2017 8:26 PM Beeper [phone]

## 2017-10-30 NOTE — Progress Notes (Signed)
ANTICOAGULATION CONSULT NOTE - Initial Consult  Pharmacy Consult for heparin Indication: LVAD  Labs: Recent Labs    10/27/17 1131 10/29/17 1049 10/29/17 1400 10/30/17 0145  HGB  --  12.1*  --  10.7*  HCT  --  36.5*  --  33.9*  PLT  --  120*  --  182  LABPROT 25.0*  --  21.3* 20.5*  INR 2.28  --  1.86 1.77  HEPARINUNFRC  --   --   --  0.42  CREATININE  --   --  7.18*  --     Assessment/Plan:  52yo male therapeutic on heparin with initial dosing while Coumadin on hold. Will continue gtt at current rate and confirm stable with additional level.   Wynona Neat, PharmD, BCPS  10/30/2017,3:31 AM

## 2017-10-31 ENCOUNTER — Inpatient Hospital Stay (HOSPITAL_COMMUNITY): Payer: BLUE CROSS/BLUE SHIELD | Admitting: Certified Registered"

## 2017-10-31 ENCOUNTER — Encounter (HOSPITAL_COMMUNITY)
Admission: EM | Disposition: A | Discharge status: Home / Self Care | Payer: Self-pay | Source: Ambulatory Visit | Attending: Cardiology

## 2017-10-31 ENCOUNTER — Telehealth: Payer: Self-pay | Admitting: Vascular Surgery

## 2017-10-31 DIAGNOSIS — N185 Chronic kidney disease, stage 5: Secondary | ICD-10-CM

## 2017-10-31 HISTORY — PX: AV FISTULA PLACEMENT: SHX1204

## 2017-10-31 HISTORY — PX: LIGATION OF ARTERIOVENOUS  FISTULA: SHX5948

## 2017-10-31 LAB — HEPARIN LEVEL (UNFRACTIONATED): HEPARIN UNFRACTIONATED: 0.56 [IU]/mL (ref 0.30–0.70)

## 2017-10-31 LAB — LACTATE DEHYDROGENASE: LDH: 212 U/L — ABNORMAL HIGH (ref 98–192)

## 2017-10-31 LAB — RENAL FUNCTION PANEL
ALBUMIN: 2.7 g/dL — AB (ref 3.5–5.0)
ANION GAP: 10 (ref 5–15)
BUN: 29 mg/dL — ABNORMAL HIGH (ref 6–20)
CHLORIDE: 99 mmol/L — AB (ref 101–111)
CO2: 26 mmol/L (ref 22–32)
Calcium: 8.2 mg/dL — ABNORMAL LOW (ref 8.9–10.3)
Creatinine, Ser: 4.47 mg/dL — ABNORMAL HIGH (ref 0.61–1.24)
GFR calc Af Amer: 16 mL/min — ABNORMAL LOW (ref >60)
GFR calc non Af Amer: 14 mL/min — ABNORMAL LOW (ref >60)
Glucose, Bld: 118 mg/dL — ABNORMAL HIGH (ref 65–99)
POTASSIUM: 4.8 mmol/L (ref 3.5–5.1)
Phosphorus: 3 mg/dL (ref 2.5–4.6)
Sodium: 135 mmol/L (ref 135–145)

## 2017-10-31 LAB — PROTIME-INR
INR: 1.44
Prothrombin Time: 17.4 seconds — ABNORMAL HIGH (ref 11.4–15.2)

## 2017-10-31 LAB — CBC
HEMATOCRIT: 32.5 % — AB (ref 39.0–52.0)
HEMOGLOBIN: 10.2 g/dL — AB (ref 13.0–17.0)
MCH: 26.8 pg (ref 26.0–34.0)
MCHC: 31.4 g/dL (ref 30.0–36.0)
MCV: 85.3 fL (ref 78.0–100.0)
Platelets: 185 10*3/uL (ref 150–400)
RBC: 3.81 MIL/uL — ABNORMAL LOW (ref 4.22–5.81)
RDW: 17.4 % — ABNORMAL HIGH (ref 11.5–15.5)
WBC: 11.3 10*3/uL — AB (ref 4.0–10.5)

## 2017-10-31 LAB — HEPATITIS B SURFACE ANTIBODY,QUALITATIVE: HEP B S AB: REACTIVE

## 2017-10-31 LAB — GLUCOSE, CAPILLARY
Glucose-Capillary: 110 mg/dL — ABNORMAL HIGH (ref 65–99)
Glucose-Capillary: 104 mg/dL — ABNORMAL HIGH (ref 65–99)
Glucose-Capillary: 99 mg/dL (ref 65–99)
Glucose-Capillary: 109 mg/dL — ABNORMAL HIGH (ref 65–99)
Glucose-Capillary: 86 mg/dL (ref 65–99)

## 2017-10-31 LAB — SURGICAL PCR SCREEN
MRSA, PCR: NEGATIVE
STAPHYLOCOCCUS AUREUS: NEGATIVE

## 2017-10-31 LAB — HEPATITIS B SURFACE ANTIGEN: Hepatitis B Surface Ag: NEGATIVE

## 2017-10-31 SURGERY — LIGATION OF ARTERIOVENOUS  FISTULA
Anesthesia: Monitor Anesthesia Care | Site: Arm Upper | Laterality: Right

## 2017-10-31 MED ORDER — CEFAZOLIN SODIUM 1 G IJ SOLR
INTRAMUSCULAR | Status: AC
  Filled 2017-10-31: qty 20

## 2017-10-31 MED ORDER — PROPOFOL 10 MG/ML IV BOLUS
INTRAVENOUS | Status: AC
  Filled 2017-10-31: qty 20

## 2017-10-31 MED ORDER — OXYCODONE HCL 5 MG PO TABS: 5.0000 mg | ORAL_TABLET | Freq: Once | ORAL | Status: DC | PRN

## 2017-10-31 MED ORDER — MIDAZOLAM HCL 5 MG/5ML IJ SOLN
INTRAMUSCULAR | Status: DC | PRN
  Administered 2017-10-31: 1 mg via INTRAVENOUS

## 2017-10-31 MED ORDER — WARFARIN SODIUM 7.5 MG PO TABS
7.5000 mg | ORAL_TABLET | Freq: Once | ORAL | Status: AC
  Administered 2017-10-31: 7.5 mg via ORAL
  Filled 2017-10-31: qty 1

## 2017-10-31 MED ORDER — CEFAZOLIN SODIUM-DEXTROSE 1-4 GM/50ML-% IV SOLN
INTRAVENOUS | Status: DC | PRN
  Administered 2017-10-31: 2 g via INTRAVENOUS

## 2017-10-31 MED ORDER — LIDOCAINE HCL (PF) 1 % IJ SOLN
INTRAMUSCULAR | Status: AC
  Filled 2017-10-31: qty 30

## 2017-10-31 MED ORDER — 0.9 % SODIUM CHLORIDE (POUR BTL) OPTIME
TOPICAL | Status: DC | PRN
  Administered 2017-10-31: 1000 mL

## 2017-10-31 MED ORDER — ROCURONIUM BROMIDE 10 MG/ML (PF) SYRINGE
PREFILLED_SYRINGE | INTRAVENOUS | Status: AC
  Filled 2017-10-31: qty 5

## 2017-10-31 MED ORDER — FENTANYL CITRATE (PF) 100 MCG/2ML IJ SOLN
INTRAMUSCULAR | Status: DC | PRN
  Administered 2017-10-31 (×3): 25 ug via INTRAVENOUS

## 2017-10-31 MED ORDER — ONDANSETRON HCL 4 MG/2ML IJ SOLN
INTRAMUSCULAR | Status: AC
  Filled 2017-10-31: qty 2

## 2017-10-31 MED ORDER — LIDOCAINE 2% (20 MG/ML) 5 ML SYRINGE
INTRAMUSCULAR | Status: AC
  Filled 2017-10-31: qty 5

## 2017-10-31 MED ORDER — INSULIN ASPART 100 UNIT/ML ~~LOC~~ SOLN
0.0000 [IU] | Freq: Three times a day (TID) | SUBCUTANEOUS | Status: DC
  Administered 2017-11-02 – 2017-11-03 (×2): 2 [IU] via SUBCUTANEOUS
  Administered 2017-11-04 (×2): 3 [IU] via SUBCUTANEOUS

## 2017-10-31 MED ORDER — INSULIN ASPART 100 UNIT/ML ~~LOC~~ SOLN: 0.0000 [IU] | Freq: Every day | SUBCUTANEOUS | Status: DC

## 2017-10-31 MED ORDER — HEMOSTATIC AGENTS (NO CHARGE) OPTIME
TOPICAL | Status: DC | PRN
  Administered 2017-10-31: 1 via TOPICAL

## 2017-10-31 MED ORDER — FENTANYL CITRATE (PF) 100 MCG/2ML IJ SOLN: 25.0000 ug | INTRAMUSCULAR | Status: DC | PRN

## 2017-10-31 MED ORDER — SODIUM CHLORIDE 0.9 % IV SOLN
INTRAVENOUS | Status: DC | PRN
  Administered 2017-10-31: 08:00:00

## 2017-10-31 MED ORDER — HEPARIN SODIUM (PORCINE) 1000 UNIT/ML IJ SOLN
INTRAMUSCULAR | Status: DC | PRN
  Administered 2017-10-31: 5000 [IU] via INTRAVENOUS

## 2017-10-31 MED ORDER — ONDANSETRON HCL 4 MG/2ML IJ SOLN
INTRAMUSCULAR | Status: DC | PRN
  Administered 2017-10-31: 4 mg via INTRAVENOUS

## 2017-10-31 MED ORDER — WARFARIN - PHARMACIST DOSING INPATIENT
Freq: Every day | Status: DC
  Administered 2017-10-31 – 2017-11-03 (×3)

## 2017-10-31 MED ORDER — SODIUM CHLORIDE 0.9 % IV SOLN
INTRAVENOUS | Status: DC | PRN
  Administered 2017-10-31: 07:00:00 via INTRAVENOUS

## 2017-10-31 MED ORDER — PROMETHAZINE HCL 25 MG/ML IJ SOLN
25.0000 mg | INTRAMUSCULAR | Status: AC
  Administered 2017-10-31 (×2): 6.25 mg via INTRAVENOUS
  Filled 2017-10-31: qty 1

## 2017-10-31 MED ORDER — FENTANYL CITRATE (PF) 250 MCG/5ML IJ SOLN
INTRAMUSCULAR | Status: AC
Start: 2017-10-31 — End: ?
  Filled 2017-10-31: qty 5

## 2017-10-31 MED ORDER — HEPARIN (PORCINE) IN NACL 100-0.45 UNIT/ML-% IJ SOLN
1200.0000 [IU]/h | INTRAMUSCULAR | Status: DC
  Administered 2017-10-31: 1200 [IU]/h via INTRAVENOUS
  Filled 2017-10-31: qty 250

## 2017-10-31 MED ORDER — LIDOCAINE-EPINEPHRINE (PF) 1 %-1:200000 IJ SOLN
INTRAMUSCULAR | Status: AC
  Filled 2017-10-31: qty 30

## 2017-10-31 MED ORDER — LIDOCAINE HCL (PF) 1 % IJ SOLN
INTRAMUSCULAR | Status: DC | PRN
  Administered 2017-10-31: 30 mL

## 2017-10-31 MED ORDER — MIDAZOLAM HCL 2 MG/2ML IJ SOLN
INTRAMUSCULAR | Status: AC
  Filled 2017-10-31: qty 2

## 2017-10-31 MED ORDER — OXYCODONE HCL 5 MG/5ML PO SOLN: 5.0000 mg | Freq: Once | ORAL | Status: DC | PRN

## 2017-10-31 SURGICAL SUPPLY — 54 items
ADH SKN CLS APL DERMABOND .7 (GAUZE/BANDAGES/DRESSINGS) ×2
AGENT HMST SPONGE THK3/8 (HEMOSTASIS) ×1
ARMBAND PINK RESTRICT EXTREMIT (MISCELLANEOUS) ×3 IMPLANT
CANISTER SUCT 3000ML PPV (MISCELLANEOUS) ×2 IMPLANT
CLIP VESOCCLUDE MED 6/CT (CLIP) ×2 IMPLANT
CLIP VESOCCLUDE SM WIDE 6/CT (CLIP) ×2 IMPLANT
COVER PROBE W GEL 5X96 (DRAPES) ×2 IMPLANT
DECANTER SPIKE VIAL GLASS SM (MISCELLANEOUS) ×2 IMPLANT
DERMABOND ADVANCED (GAUZE/BANDAGES/DRESSINGS) ×2
DERMABOND ADVANCED .7 DNX12 (GAUZE/BANDAGES/DRESSINGS) ×1 IMPLANT
ELECT REM PT RETURN 9FT ADLT (ELECTROSURGICAL) ×2
ELECTRODE REM PT RTRN 9FT ADLT (ELECTROSURGICAL) ×1 IMPLANT
GAUZE SPONGE 2X2 8PLY STRL LF (GAUZE/BANDAGES/DRESSINGS) IMPLANT
GAUZE SPONGE 4X4 12PLY STRL (GAUZE/BANDAGES/DRESSINGS) ×2 IMPLANT
GLOVE BIO SURGEON STRL SZ7 (GLOVE) ×2 IMPLANT
GLOVE BIOGEL PI IND STRL 6.5 (GLOVE) IMPLANT
GLOVE BIOGEL PI IND STRL 7.0 (GLOVE) IMPLANT
GLOVE BIOGEL PI IND STRL 7.5 (GLOVE) ×1 IMPLANT
GLOVE BIOGEL PI INDICATOR 6.5 (GLOVE) ×2
GLOVE BIOGEL PI INDICATOR 7.0 (GLOVE) ×1
GLOVE BIOGEL PI INDICATOR 7.5 (GLOVE) ×2
GLOVE ECLIPSE 6.5 STRL STRAW (GLOVE) ×2 IMPLANT
GLOVE ECLIPSE 7.0 STRL STRAW (GLOVE) ×1 IMPLANT
GLOVE SURG SS PI 6.5 STRL IVOR (GLOVE) ×1 IMPLANT
GLOVE SURG SS PI 7.0 STRL IVOR (GLOVE) ×1 IMPLANT
GOWN STRL REUS W/ TWL LRG LVL3 (GOWN DISPOSABLE) ×3 IMPLANT
GOWN STRL REUS W/ TWL XL LVL3 (GOWN DISPOSABLE) IMPLANT
GOWN STRL REUS W/TWL LRG LVL3 (GOWN DISPOSABLE) ×10
GOWN STRL REUS W/TWL XL LVL3 (GOWN DISPOSABLE) ×2
GRAFT COLLAGEN VASCULAR 7X40 (Vascular Products) ×1 IMPLANT
HEMOSTAT SPONGE AVITENE ULTRA (HEMOSTASIS) ×1 IMPLANT
KIT BASIN OR (CUSTOM PROCEDURE TRAY) ×2 IMPLANT
KIT ROOM TURNOVER OR (KITS) ×2 IMPLANT
NDL HYPO 25GX1X1/2 BEV (NEEDLE) IMPLANT
NEEDLE HYPO 25GX1X1/2 BEV (NEEDLE) ×2 IMPLANT
NS IRRIG 1000ML POUR BTL (IV SOLUTION) ×2 IMPLANT
PACK CV ACCESS (CUSTOM PROCEDURE TRAY) ×2 IMPLANT
PAD ARMBOARD 7.5X6 YLW CONV (MISCELLANEOUS) ×4 IMPLANT
SPONGE GAUZE 2X2 STER 10/PKG (GAUZE/BANDAGES/DRESSINGS) ×1
SUT ETHILON 3 0 PS 1 (SUTURE) ×3 IMPLANT
SUT GORETEX 6.0 TT13 (SUTURE) IMPLANT
SUT MNCRL AB 4-0 PS2 18 (SUTURE) ×4 IMPLANT
SUT PROLENE 6 0 BV (SUTURE) ×4 IMPLANT
SUT PROLENE 7 0 BV 1 (SUTURE) IMPLANT
SUT SILK 0 TIES 10X30 (SUTURE) ×2 IMPLANT
SUT SILK 2 0 PERMA HAND 18 BK (SUTURE) ×1 IMPLANT
SUT VIC AB 3-0 SH 27 (SUTURE) ×4
SUT VIC AB 3-0 SH 27X BRD (SUTURE) ×2 IMPLANT
SYR TOOMEY 50ML (SYRINGE) ×1 IMPLANT
SYSTEM CHEST DRAIN TLS 7FR (DRAIN) ×2 IMPLANT
TAPE CLOTH SURG 4X10 WHT LF (GAUZE/BANDAGES/DRESSINGS) ×1 IMPLANT
TOWEL GREEN STERILE (TOWEL DISPOSABLE) ×2 IMPLANT
UNDERPAD 30X30 (UNDERPADS AND DIAPERS) ×2 IMPLANT
WATER STERILE IRR 1000ML POUR (IV SOLUTION) ×2 IMPLANT

## 2017-10-31 NOTE — Telephone Encounter (Signed)
-----   Message from Mena Goes, RN sent at 10/31/2017 10:17 AM EST ----- Regarding: 4 weeks in [facility] clinic   ----- Message ----- From: Conrad [place], MD Sent: 10/31/2017   9:52 AM To: Vvs Charge Pool  [name] [id number] Feb 06, 1966  PROCEDURE:  1.  right upper arm arteriovenous graft with Artegraft 2.  Ligation of right first stage brachial vein transposition   Follow-up: 4 weeks ([facility] clinic)

## 2017-10-31 NOTE — Progress Notes (Signed)
Pt having nausea and vomiting not time for prn medications.  Md notified lab results of trop 0.07 md aware. Chest pain almost gone.  New orders received. Will continue to monitor. Saunders Revel T

## 2017-10-31 NOTE — Progress Notes (Signed)
M Collins PAS at bedside to check "bruit" by doppler in RUE. Same on adm and dc from PACU.

## 2017-10-31 NOTE — Anesthesia Procedure Notes (Signed)
Procedure Name: MAC Date/Time: 10/31/2017 7:35 AM Performed by: Lance Coon, CRNA Pre-anesthesia Checklist: Patient identified, Emergency Drugs available, Suction available, Patient being monitored and Timeout performed Patient Re-evaluated:Patient Re-evaluated prior to induction Oxygen Delivery Method: Nasal cannula

## 2017-10-31 NOTE — Progress Notes (Addendum)
Patient ID: [name], male   DOB: December 11, 1965, 52 y.o.   MRN: [id number]  [facility] Progress Note   Assessment/ Plan:   1. Chronic anticoagulation with LVAD device: Status post heparin bridge to allow for hemodialysis access creation. 2. End-stage renal disease:  Continue hemodialysis on a Monday/Wednesday/Friday schedule with his next hemodialysis planned tomorrow via tunneled hemodialysis catheter.  He is now status post right upper arm AVG creation with faint thrill audible at this time possibly from postoperative edema. 3. Hypotension: Chronic, fortunately with small ultrafiltration goals. 4. Ischemic cardiomyopathy, coronary artery disease status post LVAD and BiV- ICD 5. Anemia of chronic kidney disease: Hemoglobin currently acceptable, continue to monitor for overt losses/ESA needs. 6. Secondary hyperparathyroidism: Continue renal diet/low phosphorus diet. Restart calcitriol with hemodialysis. Not on phosphorus binder.  Subjective:   Reports to be feeling fair with expected postoperative discomfort over right upper arm   Objective:   BP 102/78 (BP Location: Left Arm)   Pulse 72   Temp 98.2 F (36.8 C)   Resp 14   Ht 5' 8"  (1.727 m)   Wt 76.7 kg (169 lb 1.5 oz)   SpO2 96%   BMI 25.71 kg/m   Physical Exam: Gen: Comfortably resting in bed, awaiting lunch CVS: Pulse regular rhythm, normal rate, mechanical hum over precordium Resp: Anteriorly clear to auscultation, no rales/rhonchi Abd: Soft, flat, nontender Ext: No lower extremity edema, status post right upper arm AVG placement with faint audible thrill.  2 postoperative drains in place connected to test tubes.  Labs: BMET Recent Labs  Lab 10/29/17 1400 10/30/17 0145 10/31/17 0346  NA 133* 132* 135  K 4.4 4.8 4.8  CL 99* 97* 99*  CO2 21* 23 26  GLUCOSE 183* 151* 118*  BUN 56* 67* 29*  CREATININE 7.18* 7.66* 4.47*  CALCIUM 8.6* 8.6* 8.2*  PHOS  --   --  3.0   CBC Recent Labs  Lab  10/29/17 1049 10/30/17 0145 10/31/17 0346  WBC 6.1 7.8 11.3*  NEUTROABS 4.0  --   --   HGB 12.1* 10.7* 10.2*  HCT 36.5* 33.9* 32.5*  MCV 84.9 84.3 85.3  PLT 120* 182 185   Medications:    . amLODipine  5 mg Oral Daily  . aspirin EC  81 mg Oral Daily  . atorvastatin  40 mg Oral q1800  . calcitRIOL  0.5 mcg Oral Q M,W,F-HD  . chlorhexidine  60 mL Topical Once  . docusate sodium  200 mg Oral Daily  . dronabinol  2.5 mg Oral BID AC  . feeding supplement (NEPRO CARB STEADY)  237 mL Oral TID WC  . hydrALAZINE  37.5 mg Oral TID  . Influenza vac split quadrivalent PF  0.5 mL Intramuscular Tomorrow-1000  . insulin aspart  5 Units Subcutaneous TID WC  . insulin glargine  5 Units Subcutaneous Daily  . metoCLOPramide (REGLAN) injection  10 mg Intravenous Q6H  . pantoprazole  40 mg Oral Daily  . pneumococcal 23 valent vaccine  0.5 mL Intramuscular Tomorrow-1000  . sildenafil  20 mg Oral TID   Elmarie Shiley, MD 10/31/2017, 12:37 PM

## 2017-10-31 NOTE — Progress Notes (Signed)
VAD Coordinator Procedure Note:   VAD Coordinator accompanied patient from room 2C17 to OR holding room.  Patient underwent right upper arm arteriovenous graft with Artegraft and ligation of right first stage brachial vein transposition per Dr. Bridgett Larsson. Hemodynamics and VAD parameters monitored by anesthesia and myself throughout the procedure. MAPs were obtained with automatic BP cuff on left arm.       Doppler Auto cuff(MAP): HR: Flow: PI: Power:     Speed:              Pre-procedure:  7:00  84  95/74 (80)  105 4.4 3.2 4.2 5600   Intra-op:  7:30    102/92 (91)  107 4.5 3.0 4.1 7:45    89/69 (78)  106 4.5 3.0 4.2 8:00    100/80 (87)  106 4.4 3.1 4.2 8:15    90/72 (79)  106 4.6 2.9 4.1 8:30    78/61 (68)  105 4.7 2.7 4.2 8:45    94/74 (78)  105 4.7 2.3 4.2 9:00    97/86 (91)  106 4.6 2.5 4.2 9:15    95/80 (87)  106 4.5 3.0 4.3 9:30    106/96 (81)  107 4.4 3.1 4.2 9:45    98/80 (85)  108 4.5 3.0 4.1     Recovery area:  10:00    97/65 (73)  105 4.3 3.1 4.2 10:15    93/76 (84)  103 4.4 3.3 4.2   Patient tolerated the procedure well.    Patient Disposition: [place] Therapist, sports, [place] Coordinator [phone]

## 2017-10-31 NOTE — Telephone Encounter (Signed)
Sched drain removal 11/10/17 at 10:00 and PA f/u 11/29/17 at 1:00. Lm on hm# to inform pt of appts.

## 2017-10-31 NOTE — Progress Notes (Signed)
LVAD Coordinator Rounding Note:  Admitted 10/29/17 for heparin bridge for AV graft placement on 10/31/17 per Dr. Bridgett Larsson.    HM III LVAD implanted on 06/12/17 by Dr. Prescott Gum under Destination Therapy criteria due to renal insufficiency.  Pt has required dialysis 3 times weekly on MWF on OP basis. His initial right arm fistula failed and he will be having a right arm AV graft today per Dr. Bridgett Larsson. VAD Coordinator will accompany patient to surgery.  Pt had nausea and "severe chest pain" yesterday afternoon during dialysis. Pt received MS for pain with relief at that time. Pt says he continued to have intermittent chest pain during night. Denies CP this am.   Vital signs: Temp: 98.2 HR: 105 Doppler Pressure: 84 Automatic BP:  95/74 (80) O2 Sat: 98% RA Wt: 168>170>169 lbs   LVAD interrogation reveals:   Speed: 5600 Flow: 3.2 Power: 4.2 w PI: 3.2 Alarms: none Events:  >120 on 10/30/17 Hematocrit: 32 Fixed speed:  5600 Low speed limit: 5300   Drive Line: left abdominal dressing dry and intact; anchor intact. Pt is having weekly dressing changes; next dressing change 11/06/17.  Labs:  LDH trend: 206>207>212  INR trend: 1.86>1.77>1.44  Anticoagulation Plan: -INR Goal:  2.0 - 2.5 -ASA Dose: 81 mg daily  Device: - St Jude dual - Therapies: on at 200 bpm    Monitor: on slow VT 150 bpm  Adverse Events on VAD: - ESRD post LVAD; started CVVH 06/15/17; HD started 06/20/17 - Hospitalization 11/5 - 07/16/17 > gastroparesis diagnosis after EGD - Hospitalization 11/26 - 08/04/17 > gastroparesis - Hospitalization 1/8 - 09/16/17>gastroparesis - referred to Dr. Corliss Parish at [facility]  Plan/Recommendations:   2. Right upper arm AV graft with Artegraft and ligation of right first stage brachial vein transposition scheduled today with Dr. Bridgett Larsson. VAD coordinator will accompany patient to OR. 2. Please page VAD coordinator for any VAD equipment or driveline issues.   Zada Girt RN, VAD  Coordinator 24/7 VAD Pager: [phone]

## 2017-10-31 NOTE — Progress Notes (Signed)
ANTICOAGULATION CONSULT NOTE - Follow Up Consult  Pharmacy Consult for Heparin and Warfarin Indication: LVAD  Allergies  Allergen Reactions  . Metformin And Related Diarrhea    Patient Measurements: Height: 5' 8"  (172.7 cm) Weight: 169 lb 1.5 oz (76.7 kg) IBW/kg (Calculated) : 68.4 Heparin Dosing Weight: 76kg  Vital Signs: Temp: 98.2 F (36.8 C) (02/26 1045) Temp Source: Oral (02/26 0328) BP: 102/78 (02/26 1048) Pulse Rate: 72 (02/26 1033)  Labs: Recent Labs    10/29/17 1049 10/29/17 1400 10/30/17 0145 10/30/17 1140 10/30/17 1830 10/31/17 0346  HGB 12.1*  --  10.7*  --   --  10.2*  HCT 36.5*  --  33.9*  --   --  32.5*  PLT 120*  --  182  --   --  185  LABPROT  --  21.3* 20.5*  --   --  17.4*  INR  --  1.86 1.77  --   --  1.44  HEPARINUNFRC  --   --  0.42 0.58  --  0.56  CREATININE  --  7.18* 7.66*  --   --  4.47*  TROPONINI  --   --   --   --  0.07*  --     Estimated Creatinine Clearance: 18.5 mL/min (A) (by C-G formula based on SCr of 4.47 mg/dL (H)).  Assessment: 89 yoM s/p Heartmate 3 LVAD 06/2017 admitted for heparin bridge with planned HD vascular access procedure on Tuesday 2/26. Last dose of warfarin was 2/22 and INR was 2.28 at that time. Heparin started once INR < 2. Heparin level therapeutic this morning at 0.56. He is now s/p AV graft placement. Heparin to resume 4 hours post-op and warfarin to resume tonight. INR 1.44. CBC/LDH stable.  PTA dose: 15m daily  Goal of Therapy:  INR 2-3 Heparin level 0.3-0.7 units/ml Monitor platelets by anticoagulation protocol: Yes   Plan:  1) Resume heparin at 1200 units/hr 2) Warfarin 7.569mtonight 3) Daily heparin level, INR, CBC, LDH  JeNena JordanPharmD, BCPS  10/31/2017 2:13 PM

## 2017-10-31 NOTE — Progress Notes (Signed)
Patient ID: [name], male   DOB: 04-Jun-1966, 52 y.o.   MRN: [id number]   Advanced Heart Failure VAD Team Note  Cardiologist: Aundra Dubin  Subjective:    Patient had surgery on right upper arm this morning for AV graft placement.  He tolerated procedure well without problems.    He had nausea, vomiting, and sharp chest pain yesterday during HD.  This has all resolved.  MAP 80s.   LVAD INTERROGATION:  HeartMate 3 LVAD:  Flow 4.3 liters/min, speed 5600, power 4.2, PI 3.0.     Objective:    Vital Signs:   Temp:  [98.2 F (36.8 C)-99.1 F (37.3 C)] 98.2 F (36.8 C) (02/26 1045) Pulse Rate:  [60-108] 72 (02/26 1033) Resp:  [13-30] 14 (02/26 1033) BP: (81-138)/(35-89) 102/78 (02/26 1048) SpO2:  [93 %-99 %] 96 % (02/26 1048) Weight:  [169 lb 1.5 oz (76.7 kg)-169 lb 15.6 oz (77.1 kg)] 169 lb 1.5 oz (76.7 kg) (02/26 0557) Last BM Date: 10/30/17 Mean arterial Pressure 80s  Intake/Output:   Intake/Output Summary (Last 24 hours) at 10/31/2017 1336 Last data filed at 10/31/2017 1316 Gross per 24 hour  Intake 1307.2 ml  Output 80 ml  Net 1227.2 ml     Physical Exam    GENERAL: Well appearing this am. NAD.  HEENT: Normal. NECK: Supple, JVP 7-8 cm. Carotids OK.  CARDIAC:  Mechanical heart sounds with LVAD hum present.  LUNGS:  CTAB, normal effort.  ABDOMEN:  NT, ND, no HSM. No bruits or masses. +BS  LVAD exit site: Well-healed and incorporated. Dressing dry and intact. No erythema or drainage. Stabilization device present and accurately applied. Driveline dressing changed daily per sterile technique. EXTREMITIES:  Warm and dry. No cyanosis, clubbing, rash, or edema. RUE with AV graft.  NEUROLOGIC:  Alert & oriented x 3. Cranial nerves grossly intact. Moves all 4 extremities w/o difficulty. Affect pleasant     Telemetry   NSR in 90s, personally reviewed.   Labs   Basic Metabolic Panel: Recent Labs  Lab 10/29/17 1400 10/30/17 0145 10/31/17 0346  NA 133* 132* 135  K  4.4 4.8 4.8  CL 99* 97* 99*  CO2 21* 23 26  GLUCOSE 183* 151* 118*  BUN 56* 67* 29*  CREATININE 7.18* 7.66* 4.47*  CALCIUM 8.6* 8.6* 8.2*  MG 1.8  --   --   PHOS  --   --  3.0    Liver Function Tests: Recent Labs  Lab 10/29/17 1400 10/31/17 0346  AST 33  --   ALT 35  --   ALKPHOS 101  --   BILITOT 0.4  --   PROT 6.6  --   ALBUMIN 3.0* 2.7*   No results for input(s): LIPASE, AMYLASE in the last 168 hours. No results for input(s): AMMONIA in the last 168 hours.  CBC: Recent Labs  Lab 10/29/17 1049 10/30/17 0145 10/31/17 0346  WBC 6.1 7.8 11.3*  NEUTROABS 4.0  --   --   HGB 12.1* 10.7* 10.2*  HCT 36.5* 33.9* 32.5*  MCV 84.9 84.3 85.3  PLT 120* 182 185    INR: Recent Labs  Lab 10/27/17 1131 10/29/17 1400 10/30/17 0145 10/31/17 0346  INR 2.28 1.86 1.77 1.44    Other results:  EKG:    Imaging   Dg Chest Port 1 View  Result Date: 10/29/2017 CLINICAL DATA:  Central line placement. EXAM: PORTABLE CHEST 1 VIEW COMPARISON:  07/31/2017 and prior radiographs FINDINGS: A LEFT IJ central venous catheter is  now noted with tip overlying the LOWER SVC. A RIGHT IJ central venous catheter is again noted with tip overlying the RIGHT atrium. Cardiomegaly, LVAD and LEFT-sided pacemaker/ICD again noted. There is no evidence of pneumothorax. Improved aeration in the LOWER lungs noted. IMPRESSION: LEFT IJ central venous catheter placement with tip overlying the LOWER SVC. No evidence of pneumothorax. Improved bibasilar aeration. Electronically Signed   By: Margarette Canada M.D.   On: 10/29/2017 15:51     Medications:     Scheduled Medications: . amLODipine  5 mg Oral Daily  . aspirin EC  81 mg Oral Daily  . atorvastatin  40 mg Oral q1800  . calcitRIOL  0.5 mcg Oral Q M,W,F-HD  . chlorhexidine  60 mL Topical Once  . docusate sodium  200 mg Oral Daily  . dronabinol  2.5 mg Oral BID AC  . feeding supplement (NEPRO CARB STEADY)  237 mL Oral TID WC  . hydrALAZINE  37.5 mg Oral  TID  . Influenza vac split quadrivalent PF  0.5 mL Intramuscular Tomorrow-1000  . insulin aspart  0-15 Units Subcutaneous TID WC  . insulin aspart  0-5 Units Subcutaneous QHS  . insulin aspart  5 Units Subcutaneous TID WC  . insulin glargine  5 Units Subcutaneous Daily  . metoCLOPramide (REGLAN) injection  10 mg Intravenous Q6H  . pantoprazole  40 mg Oral Daily  . pneumococcal 23 valent vaccine  0.5 mL Intramuscular Tomorrow-1000  . sildenafil  20 mg Oral TID    Infusions: . sodium chloride    . sodium chloride    . heparin Stopped (10/31/17 0536)    PRN Medications: sodium chloride, sodium chloride, acetaminophen, alteplase, heparin, lidocaine (PF), lidocaine-prilocaine, LORazepam, ondansetron (ZOFRAN) IV, ondansetron, pentafluoroprop-tetrafluoroeth, promethazine, promethazine    Assessment/Plan:    1 ESRD: Admitted for AV graft placement and heparin bridge. Graft placed today.   - Will need HD tomorrow.   2. Chronic systolic HF: Ischemic cardiomyopathy. St Jude ICD. NSTEMI in March 2018 with DES to LAD and RCA, complicated by cardiogenic shock, low output requiring milrinone. Echo 8/18 with EF 20-25%, moderate MR. CPX (8/18) with severe functional impairment due to HF.  s/p HM-3 VAD in 10/18. NYHA II with VAD. MAP in 80s.  - Volume status managed with HD - VAD interrogated personally. Parameters stable. 3. CAD: NSTEMI in 3/18. LHC with 99% ulcerated lesion proximal RCA with left to right collaterals, 95% mid LAD stenosis after mid LAD stent. s/p PCI to RCA and LAD on 11/25/16. Chest pain yesterday during HD, this was sharp and associated with nausea/vomiting.  Suspect non-ischemic.  - Continue aspirin and statin.  4. LVAD present - VAD interrogated personally. Parameters stable - Driveline ok - LDH stable.  - MAP primarily in 80s.  5. h/o DVTs: Factor V Leiden heterozygote. - On coumadin. Bridging with heparin  6. Diabetic gastroparesis:Difficult to manage.He has seen  gastroparesis specialist at [facility] and workup is continuing. Nausea/vomiting yesterday, has now resolved. - He has ongoing workup with gastroparesis specialist at [place] 5 mg tid/ac 7. SJ:[id number] is now well-controlled. 8. HTN: Continue home regimen for now.  9. PAD: Moderate bilateral disease on ABIs in 8/18. Denies claudication currently, no pedal ulcers.  10. Atrial Flutter-Paroxysmal  - in NSR today 11. Anemia: Hgb lower than in past but stable.  No BRBPR/melean.   I reviewed the LVAD parameters from today, and compared the results to the patient's prior recorded data.  No programming  changes were made.  The LVAD is functioning within specified parameters.  The patient performs LVAD self-test daily.  LVAD interrogation was negative for any significant power changes, alarms or PI events/speed drops.  LVAD equipment check completed and is in good working order.  Back-up equipment present.   LVAD education done on emergency procedures and precautions and reviewed exit site care.  Length of Stay: 2  Loralie Champagne, MD 10/31/2017, 1:36 PM  VAD Team --- VAD ISSUES ONLY--- Pager [phone] (7am - 7am)  Advanced Heart Failure Team  Pager [phone] (M-F; 7a - 4p)  Please contact [facility] for night-coverage after hours (4p -7a ) and weekends on [email or web address]

## 2017-10-31 NOTE — Progress Notes (Addendum)
Report given to [facility].  Pt transported to OR via stretcher with Cloyde Reams.   Pt's cell phone and glasses given to friend at pts bedside.

## 2017-10-31 NOTE — Telephone Encounter (Signed)
1 week for drain removal  Received: Today  Message Contents  McChesney, Tania Ade, RN  P [facility] Admin Pool      Previous Messages    ----- Message -----  From: Ulyses Amor, PA-C  Sent: 10/31/2017 10:28 AM  To: [facility] Charge Pool   F/U with Dr. Bridgett Larsson in 1 week for drain removal s/p right UE AV graft.

## 2017-10-31 NOTE — Anesthesia Postprocedure Evaluation (Signed)
Anesthesia Post Note  Patient: [name]  Procedure(s) Performed: LIGATION OF BRACHIO-BASILIC VEIN TRANSPOSITION RIGHT ARM (Right Arm Upper) INSERTION OF ARTERIOVENOUS (AV) ARTEGRAFT,  RIGHT UPPER ARM (Right Arm Upper)     Patient location during evaluation: PACU Anesthesia Type: MAC Level of consciousness: awake and alert Pain management: pain level controlled Vital Signs Assessment: post-procedure vital signs reviewed and stable Respiratory status: spontaneous breathing, nonlabored ventilation, respiratory function stable and patient connected to nasal cannula oxygen Cardiovascular status: stable and blood pressure returned to baseline Postop Assessment: no apparent nausea or vomiting Anesthetic complications: no    Last Vitals:  Vitals:   10/31/17 1045 10/31/17 1048  BP:  102/78  Pulse:    Resp:    Temp: 36.8 C   SpO2:  96%    Last Pain:  Vitals:   10/31/17 1033  TempSrc:   PainSc: 0-No pain                 Ayrabella Labombard

## 2017-10-31 NOTE — Interval H&P Note (Signed)
History and Physical Interval Note:  10/31/2017 7:07 AM  [name]  has presented today for surgery, with the diagnosis of CHRONIC KIDNEY DISEASE FOR HEMODIALYSIS ACCESS  The various methods of treatment have been discussed with the patient and family. After consideration of risks, benefits and other options for treatment, the patient has consented to  Procedure(s): LIGATION OF BRACHIO-BASILIC VEIN TRANSPOSITION RIGHT ARM (Right) INSERTION OF ARTERIOVENOUS (AV) GRAFT WITH ARTEGRAFT RIGHT UPPER ARM (Right) as a surgical intervention .  The patient's history has been reviewed, patient examined, no change in status, stable for surgery.  I have reviewed the patient's chart and labs.  Questions were answered to the patient's satisfaction.     Adele Barthel

## 2017-10-31 NOTE — Transfer of Care (Signed)
Immediate Anesthesia Transfer of Care Note  Patient: [name]  Procedure(s) Performed: LIGATION OF BRACHIO-BASILIC VEIN TRANSPOSITION RIGHT ARM (Right Arm Upper) INSERTION OF ARTERIOVENOUS (AV) ARTEGRAFT,  RIGHT UPPER ARM (Right Arm Upper)  Patient Location: PACU  Anesthesia Type:MAC  Level of Consciousness: awake and patient cooperative  Airway & Oxygen Therapy: Patient Spontanous Breathing  Post-op Assessment: Report given to RN and Post -op Vital signs reviewed and stable  Post vital signs: Reviewed and stable  Last Vitals:  Vitals:   10/31/17 0328 10/31/17 0546  BP: 97/62 95/74  Pulse:    Resp: 13 (!) 21  Temp: 37 C 37.3 C  SpO2: 96% 97%    Last Pain:  Vitals:   10/31/17 0328  TempSrc: Oral  PainSc:       Patients Stated Pain Goal: 0 (60/73/71 0626)  Complications: No apparent anesthesia complications

## 2017-10-31 NOTE — Op Note (Signed)
OPERATIVE NOTE   PROCEDURE:  1.  right upper arm arteriovenous graft with Artegraft 2.  Ligation of right first stage brachial vein transposition   PRE-OPERATIVE DIAGNOSIS: end stage renal disease   POST-OPERATIVE DIAGNOSIS: same as above   SURGEON: Adele Barthel, MD  ASSISTANT(S): Gerri Lins, PAC   ANESTHESIA: local and MAC  ESTIMATED BLOOD LOSS: 50 cc  FINDING(S): 1. Functioning brachiobrachial fistula with dilation of the brachial vein fistula to 4 mm 2. Brachial artery only 2.5 mm in diameter at level of antecubitum 3. Intact radial artery signal with <50% augmentation with venous outflow occlusion 4.   Continuous signal in brachial vein distal to the venous anastomosis  SPECIMEN(S):  none  INDICATIONS:   [name] is a 52 y.o. male with LVAD who presents with end stage renal disease requiring hemodialysis.  I previously placed a right first stage brachial vein transposition but the fistula failed to mature adequately.  The patient present for right first stage brachial vein transposition ligation and right upper arm arteriovenous graft placement.  Risk, benefits, and alternatives to access surgery were discussed.  The patient is aware the risks include but are not limited to: bleeding, infection, steal syndrome, nerve damage, ischemic monomelic neuropathy, failure to mature, and need for additional procedures.  The patient is aware of the risks and elects to proceed forward.   DESCRIPTION: After full informed written consent was obtained from the patient, the patient was brought back to the operating room and placed supine upon the operating table.  The patient was given IV antibiotics prior to proceeding.  After obtaining adequate sedation, the patient was prepped and draped in standard fashion for a right arm access procedure.    I turned my attention first to the antecubitum.  Under ultrasound guidance, I identified the location of the brachial artery and  marked it on the skin.  I then examined the bicipital groove and identified the high brachial vein and marked it on the skin.    I injected a total of 30 cc of 1% lidocaine without epinephrine overlying the arterial and venous exposures and along the tunnel route of the graft.  I made an incision through the prior antecubital incision and extended it over the brachial artery proximal to the anastomosis.  I dissected down through the subcutaneous tissue to the fascia carefully and was able to dissect out the brachial artery.  The artery was about 2.5 mm externally.  It was controlled proximally and distally with vessel loops.  The first stage brachial vein transposition was easily identified and found to be 4 mm in diameter.  Subsequently, I felt using the distal fistula as a tapered segment for the anastomosis was a better option.  I turned my attention to the high bicipital groove.  I made an incision overlying the proximal vein and dissected down through the subcutaneous tissue and fascia until I reached the high brachial vein.  Externally, it appeared to be 4-5 mm in diameter.  On Sonosite, there was no larger vein evident.  I dissected out this vein proximally and distally.    I took a Dietitian and dissected from the arterial exposure up to the venous exposure.  I then delivered a previously prepared 6 mm Artegraft by sewing it to the inner cannula.  I pulled the graft through the metal tunnel, taking care to maintain the orientation that I had previously marked on this graft.  I pulled out the metal tunnel leaving  the graft in place.    I then gave the patient 5000 units of heparin to gain some anticoagulation.  After waiting 3 minutes, I tied off the brachial vein transposition proximally and then clamped the fistula distally near the anastomosis.  I transected the fistula near the 2-0 silk tie.  I passed a 3 mm dilator easily through the fistula anastomosis.  I then spatulated the fistula  with a Potts scissor to facilitate an end-to-end anastomosis.  I sewed the distal end of the Artegraft to this residual segment of the fistula in an end-to-end fashion with a running stitch of 7-0 Prolene.  Prior to completing this anastomosis, I backbled the artery.  There was continuous blood flow from the artery without thrombus.  I completed the anastomosis in the usual fashion.  I clamped the graft near its arterial anastomosis and sucked out all the blood in the graft and loaded the graft with heparinized saline.    At this point, I pulled the graft to appropriate length and reset my exposure of the high brachial vein.  Based on the size of the high brachial vein, I felt an end-to-side anastomosis was going to be needed as the vein was not quite large enough.  I clamped the high brachial vein proximally and distally.  I made a venotomy and then extended it proximally and distally.  I spatulated the Artegraft for the dimensions of the venotomy.  This Artegraft was sewn to the vein in an end-to-size configuration with a 6-0 Prolene.  Prior to completing this anastomosis, I allowed the vein to backbleed.  I also allowed the graft to bleed in an antegrade fashion.  There was no clot observe from either end of the anastomosis.  I completed this anastomosis in the usual fashion.  At this point, I irrigated out the venous exposure and then packed Avitene in this incision.  I turned my attention back to the arterial exposure.  This incision was washed out also.  I repacked the incision with Avitene.  I tested the distal arterial flow with a continuous doppler: distal radial signal was dopplerable with a continuous wave form.  This signal did not significantly augment with venous occlusion.  The brachial artery proximally and distally had a continuous waveform.  The venous outflow had a flow consistent with continuous flow.      At this point, I washed out the arterial exposure.  There was no more active  bleeding.  I felt that pIacement of a drain was needed as this patient requires continuous anticoagulation with the LVAD in place.  I selected a TLS drain.   passed the TLS trocar through the subcutaneous tissue inferior to the incision line.  I pulled the drain to appropriate position and secured it with a 3-0 Nylon suture tied to the drain.  I shortened the drain to appropriate length and place it adjacent to the artery and nerve.  The exterior hardware was attached to the drain and the test tube was not attached until the skin was close.   The subcutaneous tissue was reapproximated with a running stitch of 3-0 Vicryl.  The skin was then reapproximated with a running subcuticular 4-0 Monocryl.  The skin was then cleaned, dried, and Dermabond used to reinforce the skin closure.    I then repeated this same process in the vein exposure.  Similarly the bleeding was evaluated and the subcutaneous tissue and skin reapproximated and closed.  I also placed a TLS in this venous  exposure incision.  Both TLS drains were charged with test tubes.   COMPLICATIONS: none  CONDITION: guarded (baseline with LVAD)   Adele Barthel, MD, FACS [facility] Office: [phone] Pager: [phone]  10/31/2017, 9:35 AM

## 2017-10-31 NOTE — Anesthesia Preprocedure Evaluation (Signed)
Anesthesia Evaluation  Patient identified by MRN, date of birth, ID band Patient awake    Reviewed: Allergy & Precautions, H&P , NPO status , Patient's Chart, lab work & pertinent test results  Airway Mallampati: II  TM Distance: >3 FB Neck ROM: Full    Dental no notable dental hx. (+) Edentulous Upper, Edentulous Lower, Dental Advisory Given   Pulmonary neg pulmonary ROS,    Pulmonary exam normal breath sounds clear to auscultation       Cardiovascular hypertension, Pt. on medications + CAD, + Past MI and +CHF  + Cardiac Defibrillator  Rhythm:Regular Rate:Normal  LVAD   Neuro/Psych negative neurological ROS  negative psych ROS   GI/Hepatic Neg liver ROS, GERD  ,  Endo/Other  diabetes, Type 1, Insulin Dependent  Renal/GU ESRF and DialysisRenal disease  negative genitourinary   Musculoskeletal   Abdominal   Peds  Hematology negative hematology ROS (+)   Anesthesia Other Findings   Reproductive/Obstetrics negative OB ROS                             Anesthesia Physical Anesthesia Plan  ASA: III  Anesthesia Plan: MAC   Post-op Pain Management:    Induction:   PONV Risk Score and Plan: 1 and Ondansetron  Airway Management Planned: Nasal Cannula  Additional Equipment:   Intra-op Plan:   Post-operative Plan:   Informed Consent: I have reviewed the patients History and Physical, chart, labs and discussed the procedure including the risks, benefits and alternatives for the proposed anesthesia with the patient or authorized representative who has indicated his/her understanding and acceptance.   Dental advisory given  Plan Discussed with: CRNA and Surgeon  Anesthesia Plan Comments:         Anesthesia Quick Evaluation

## 2017-10-31 NOTE — Progress Notes (Signed)
Molly paged this am. Aware of pt going to OR.  Per Cloyde Reams will turn Heparin gtt off.  Will continue to monitor Saunders Revel T

## 2017-11-01 ENCOUNTER — Inpatient Hospital Stay (HOSPITAL_COMMUNITY): Payer: BLUE CROSS/BLUE SHIELD

## 2017-11-01 ENCOUNTER — Encounter (HOSPITAL_COMMUNITY): Payer: Self-pay | Admitting: Vascular Surgery

## 2017-11-01 DIAGNOSIS — I361 Nonrheumatic tricuspid (valve) insufficiency: Secondary | ICD-10-CM

## 2017-11-01 LAB — GLUCOSE, CAPILLARY
GLUCOSE-CAPILLARY: 145 mg/dL — AB (ref 65–99)
Glucose-Capillary: 188 mg/dL — ABNORMAL HIGH (ref 65–99)
Glucose-Capillary: 106 mg/dL — ABNORMAL HIGH (ref 65–99)
Glucose-Capillary: 98 mg/dL (ref 65–99)

## 2017-11-01 LAB — CBC
HCT: 29.5 % — ABNORMAL LOW (ref 39.0–52.0)
Hemoglobin: 9.4 g/dL — ABNORMAL LOW (ref 13.0–17.0)
MCH: 27.7 pg (ref 26.0–34.0)
MCHC: 31.9 g/dL (ref 30.0–36.0)
MCV: 87 fL (ref 78.0–100.0)
PLATELETS: 194 10*3/uL (ref 150–400)
RBC: 3.39 MIL/uL — ABNORMAL LOW (ref 4.22–5.81)
RDW: 17.8 % — AB (ref 11.5–15.5)
WBC: 6.4 10*3/uL (ref 4.0–10.5)

## 2017-11-01 LAB — RENAL FUNCTION PANEL
Albumin: 2.6 g/dL — ABNORMAL LOW (ref 3.5–5.0)
Anion gap: 9 (ref 5–15)
BUN: 47 mg/dL — AB (ref 6–20)
CHLORIDE: 100 mmol/L — AB (ref 101–111)
CO2: 26 mmol/L (ref 22–32)
Calcium: 8.3 mg/dL — ABNORMAL LOW (ref 8.9–10.3)
Creatinine, Ser: 6.47 mg/dL — ABNORMAL HIGH (ref 0.61–1.24)
GFR calc Af Amer: 10 mL/min — ABNORMAL LOW (ref >60)
GFR calc non Af Amer: 9 mL/min — ABNORMAL LOW (ref >60)
GLUCOSE: 151 mg/dL — AB (ref 65–99)
POTASSIUM: 4.9 mmol/L (ref 3.5–5.1)
Phosphorus: 4.1 mg/dL (ref 2.5–4.6)
Sodium: 135 mmol/L (ref 135–145)

## 2017-11-01 LAB — HEPARIN LEVEL (UNFRACTIONATED): Heparin Unfractionated: 0.27 IU/mL — ABNORMAL LOW (ref 0.30–0.70)

## 2017-11-01 LAB — LACTATE DEHYDROGENASE: LDH: 183 U/L (ref 98–192)

## 2017-11-01 LAB — PROTIME-INR
INR: 1.28
PROTHROMBIN TIME: 15.9 s — AB (ref 11.4–15.2)

## 2017-11-01 LAB — ECHOCARDIOGRAM LIMITED
HEIGHTINCHES: 68 in
Weight: 2807.07 oz

## 2017-11-01 MED ORDER — SODIUM CHLORIDE 0.9% FLUSH
10.0000 mL | Freq: Two times a day (BID) | INTRAVENOUS | Status: DC
  Administered 2017-11-01 – 2017-11-02 (×2): 10 mL
  Administered 2017-11-03: 20 mL
  Administered 2017-11-03 – 2017-11-04 (×2): 10 mL

## 2017-11-01 MED ORDER — HYDRALAZINE HCL 25 MG PO TABS
37.5000 mg | ORAL_TABLET | ORAL | Status: DC
  Administered 2017-11-02 – 2017-11-04 (×4): 37.5 mg via ORAL
  Filled 2017-11-01 (×5): qty 1.5

## 2017-11-01 MED ORDER — SODIUM CHLORIDE 0.9% FLUSH: 10.0000 mL | INTRAVENOUS | Status: DC | PRN

## 2017-11-01 MED ORDER — AMLODIPINE BESYLATE 5 MG PO TABS
5.0000 mg | ORAL_TABLET | ORAL | Status: DC
  Administered 2017-11-02 – 2017-11-04 (×2): 5 mg via ORAL
  Filled 2017-11-01 (×2): qty 1

## 2017-11-01 MED ORDER — WARFARIN SODIUM 10 MG PO TABS
10.0000 mg | ORAL_TABLET | Freq: Once | ORAL | Status: AC
  Administered 2017-11-01: 10 mg via ORAL
  Filled 2017-11-01: qty 1

## 2017-11-01 MED ORDER — HEPARIN (PORCINE) IN NACL 100-0.45 UNIT/ML-% IJ SOLN
1450.0000 [IU]/h | INTRAMUSCULAR | Status: DC
  Administered 2017-11-01 – 2017-11-03 (×3): 1350 [IU]/h via INTRAVENOUS
  Administered 2017-11-03: 1450 [IU]/h via INTRAVENOUS
  Filled 2017-11-01 (×4): qty 250

## 2017-11-01 MED ORDER — CALCITRIOL 0.5 MCG PO CAPS
ORAL_CAPSULE | ORAL | Status: AC
  Administered 2017-11-01: 10:00:00
  Filled 2017-11-01: qty 1

## 2017-11-01 NOTE — Progress Notes (Signed)
   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  No steal sx.  Follow up in the office next week for drain removal.  Due to need for anticoagulation, drains at both exposures were placed.  Hopefully AVG will be ready in 4 weeks.  If AVG fails, not certain what next option is as I doubt a PTFE will stay open if the Artegraft doesn't stay open.   Subjective  - 1 Day Post-Op   Pain controllable   Objective   Vitals:   10/31/17 1946 10/31/17 2333 11/01/17 0405 11/01/17 0642  BP: 93/79 (!) 103/8 94/80   Pulse: 100 (!) 55 99   Resp: 15 13 19    Temp: 98.6 F (37 C) 98.4 F (36.9 C) 99 F (37.2 C)   TempSrc: Oral Oral Oral   SpO2: 97% 98% 97%   Weight:    173 lb 8 oz (78.7 kg)  Height:         Intake/Output Summary (Last 24 hours) at 11/01/2017 0825 Last data filed at 11/01/2017 0100 Gross per 24 hour  Intake 1124.8 ml  Output 10 ml  Net 1114.8 ml    PULM  CTAB  CV  RRR  GI  soft, NTND  [employer] c/d/i, some echymosis in surgical sites, TLS drains in place with minimal drainage: 10 cc recorded  NEURO hand grip 5/5, sensation intact    Laboratory   CBC CBC Latest Ref Rng & Units 11/01/2017 10/31/2017 10/30/2017  WBC 4.0 - 10.5 K/uL 6.4 11.3(H) 7.8  Hemoglobin 13.0 - 17.0 g/dL 9.4(L) 10.2(L) 10.7(L)  Hematocrit 39.0 - 52.0 % 29.5(L) 32.5(L) 33.9(L)  Platelets 150 - 400 K/uL 194 185 182    BMET    Component Value Date/Time   NA 135 11/01/2017 0400   NA 144 05/11/2017 0938   K 4.9 11/01/2017 0400   CL 100 (L) 11/01/2017 0400   CO2 26 11/01/2017 0400   GLUCOSE 151 (H) 11/01/2017 0400   BUN 47 (H) 11/01/2017 0400   BUN 68 (H) 05/11/2017 0938   CREATININE 6.47 (H) 11/01/2017 0400   CREATININE 1.63 (H) 11/09/2016 1505   CALCIUM 8.3 (L) 11/01/2017 0400   CALCIUM 8.7 06/27/2017 1152   GFRNONAA 9 (L) 11/01/2017 0400   GFRNONAA 48 (L) 11/09/2016 1505   GFRAA 10 (L) 11/01/2017 0400   GFRAA 55 (L) 11/09/2016 1505     Adele Barthel, MD, FACS [facility] Office: [phone] Pager: [phone]  11/01/2017, 8:25 AM

## 2017-11-01 NOTE — Progress Notes (Signed)
Ramp echo performed per Dr. Aundra Dubin while pt on dialysis to assess high # of PI's and suction events noted during dialysis treatments.  Speed  Flow  PI  Power  LVIDD  AI  Aortic openings  MR  TR  Septum  RV   5600 4.3 3.3 4.1 4.2 none 2/5 none trace Bowing to LV Dilated Mod HK  5500  4.3 2.9 4.0 4.3 none 2/5 none trace More midline   5300  4.1 3.6 3.8 4.3 none 2/5 none trace midline                                           Ramp ECHO performed at bedside.   At completion of ramp study, patients primary controller programmed:   Fixed speed: 5500 RPM Low speed limit: 5200 RPM  [place], VAD Coordinator 24/7 pager [phone]

## 2017-11-01 NOTE — Progress Notes (Addendum)
Patient ID: [name], male   DOB: Jan 16, 1966, 52 y.o.   MRN: [id number]   Advanced Heart Failure VAD Team Note  Cardiologist: Aundra Dubin  Subjective:    Patient had surgery on right upper arm 2/26 for AV graft placement.  He tolerated procedure well without problems.    MAP primarily in 80s today.  No further nausea/vomiting.  Getting HD this morning.   Hemoglobin lower at 9.4 but no overt bleeding.  INR 1.28 today, on heparin gtt and warfarin.   LVAD INTERROGATION:  HeartMate 3 LVAD:  Flow 4.2 liters/min, speed 5600, power 4.1, PI 3.3.   10-15 PIs.   Objective:    Vital Signs:   Temp:  [98.2 F (36.8 C)-99.1 F (37.3 C)] 99 F (37.2 C) (02/27 0405) Pulse Rate:  [55-105] 99 (02/27 0405) Resp:  [0-20] 19 (02/27 0405) BP: (93-106)/(8-82) 94/80 (02/27 0405) SpO2:  [89 %-98 %] 97 % (02/27 0405) Weight:  [173 lb 8 oz (78.7 kg)] 173 lb 8 oz (78.7 kg) (02/27 0642) Last BM Date: 10/30/17 Mean arterial Pressure 80s  Intake/Output:   Intake/Output Summary (Last 24 hours) at 11/01/2017 0818 Last data filed at 11/01/2017 0100 Gross per 24 hour  Intake 1124.8 ml  Output 10 ml  Net 1114.8 ml     Physical Exam    GENERAL: Well appearing this am. NAD.  HEENT: Normal. NECK: Supple, JVP 7-8 cm. Carotids OK.  CARDIAC:  Mechanical heart sounds with LVAD hum present.  LUNGS:  CTAB, normal effort.  ABDOMEN:  NT, ND, no HSM. No bruits or masses. +BS  LVAD exit site: Well-healed and incorporated. Dressing dry and intact. No erythema or drainage. Stabilization device present and accurately applied. Driveline dressing changed daily per sterile technique. EXTREMITIES:  Warm and dry. No cyanosis, clubbing, rash, or edema.  NEUROLOGIC:  Alert & oriented x 3. Cranial nerves grossly intact. Moves all 4 extremities w/o difficulty. Affect pleasant    Telemetry   NSR in 90s-100s, personally reviewed.   Labs   Basic Metabolic Panel: Recent Labs  Lab 10/29/17 1400 10/30/17 0145  10/31/17 0346 11/01/17 0400  NA 133* 132* 135 135  K 4.4 4.8 4.8 4.9  CL 99* 97* 99* 100*  CO2 21* 23 26 26   GLUCOSE 183* 151* 118* 151*  BUN 56* 67* 29* 47*  CREATININE 7.18* 7.66* 4.47* 6.47*  CALCIUM 8.6* 8.6* 8.2* 8.3*  MG 1.8  --   --   --   PHOS  --   --  3.0 4.1    Liver Function Tests: Recent Labs  Lab 10/29/17 1400 10/31/17 0346 11/01/17 0400  AST 33  --   --   ALT 35  --   --   ALKPHOS 101  --   --   BILITOT 0.4  --   --   PROT 6.6  --   --   ALBUMIN 3.0* 2.7* 2.6*   No results for input(s): LIPASE, AMYLASE in the last 168 hours. No results for input(s): AMMONIA in the last 168 hours.  CBC: Recent Labs  Lab 10/29/17 1049 10/30/17 0145 10/31/17 0346 11/01/17 0400  WBC 6.1 7.8 11.3* 6.4  NEUTROABS 4.0  --   --   --   HGB 12.1* 10.7* 10.2* 9.4*  HCT 36.5* 33.9* 32.5* 29.5*  MCV 84.9 84.3 85.3 87.0  PLT 120* 182 185 194    INR: Recent Labs  Lab 10/27/17 1131 10/29/17 1400 10/30/17 0145 10/31/17 0346 11/01/17 0400  INR 2.28 1.86  1.77 1.44 1.28    Other results:  EKG:    Imaging   No results found.   Medications:     Scheduled Medications: . amLODipine  5 mg Oral Daily  . aspirin EC  81 mg Oral Daily  . atorvastatin  40 mg Oral q1800  . calcitRIOL      . calcitRIOL  0.5 mcg Oral Q M,W,F-HD  . docusate sodium  200 mg Oral Daily  . dronabinol  2.5 mg Oral BID AC  . feeding supplement (NEPRO CARB STEADY)  237 mL Oral TID WC  . hydrALAZINE  37.5 mg Oral TID  . Influenza vac split quadrivalent PF  0.5 mL Intramuscular Tomorrow-1000  . insulin aspart  0-15 Units Subcutaneous TID WC  . insulin aspart  0-5 Units Subcutaneous QHS  . insulin aspart  5 Units Subcutaneous TID WC  . insulin glargine  5 Units Subcutaneous Daily  . metoCLOPramide (REGLAN) injection  10 mg Intravenous Q6H  . pantoprazole  40 mg Oral Daily  . pneumococcal 23 valent vaccine  0.5 mL Intramuscular Tomorrow-1000  . sildenafil  20 mg Oral TID  . Warfarin -  Pharmacist Dosing Inpatient   Does not apply q1800    Infusions: . sodium chloride    . sodium chloride    . heparin      PRN Medications: sodium chloride, sodium chloride, acetaminophen, alteplase, heparin, lidocaine (PF), lidocaine-prilocaine, LORazepam, ondansetron (ZOFRAN) IV, ondansetron, pentafluoroprop-tetrafluoroeth, promethazine, promethazine    Assessment/Plan:    1 ESRD: Admitted for AV graft placement and heparin bridge. Graft placed 2/27, has good flow per vascular.   - HD today.   2. Chronic systolic HF: Ischemic cardiomyopathy. St Jude ICD. NSTEMI in March 2018 with DES to LAD and RCA, complicated by cardiogenic shock, low output requiring milrinone. Echo 8/18 with EF 20-25%, moderate MR. CPX (8/18) with severe functional impairment due to HF.  s/p HM-3 VAD in 10/18. NYHA II with VAD. MAP in 80s generally.  - Volume status managed with HD - VAD interrogated personally. Parameters remain stable. 3. CAD: NSTEMI in 3/18. LHC with 99% ulcerated lesion proximal RCA with left to right collaterals, 95% mid LAD stenosis after mid LAD stent. s/p PCI to RCA and LAD on 11/25/16. No chest pain.  - Continue aspirin and statin.  4. LVAD present - VAD interrogated personally. Parameters stable.  - Driveline ok - LDH stable.  - MAP primarily in 80s.  - He is back on heparin/warfarin overlap, stop heparin and send home when INR gets to 1.8.  - He has been noted to have a lot of PI events and has had low flows on HD.  It is possible that speed is too fast for HD and he is getting suction => will look at LV and septal position under echo while getting HD today, may need to decrease speed of LVAD to 5500 rpm.  5. h/o DVTs: Factor V Leiden heterozygote. - On coumadin. Bridging with heparin  6. Diabetic gastroparesis:Difficult to manage.He has seen gastroparesis specialist at [facility] and workup is continuing. No nausea/vomiting today.  - He has ongoing workup with gastroparesis  specialist at [place] 5 mg tid/ac 7. [place]:[id number] is now well-controlled. 8. HTN: Continue home regimen for now, gets amlodipine and hydralazine on non-HD days.  9. PAD: Moderate bilateral disease on ABIs in 8/18. Denies claudication currently, no pedal ulcers.  10. Atrial Flutter-Paroxysmal  - in NSR today 11. Anemia: Hgb lower than  in past, down to 9.4 today.  No over bleeding.   - FOBT and Fe studies.   I reviewed the LVAD parameters from today, and compared the results to the patient's prior recorded data.  No programming changes were made.  The LVAD is functioning within specified parameters.  The patient performs LVAD self-test daily.  LVAD interrogation was negative for any significant power changes, alarms or PI events/speed drops.  LVAD equipment check completed and is in good working order.  Back-up equipment present.   LVAD education done on emergency procedures and precautions and reviewed exit site care.  Length of Stay: 3  Loralie Champagne, MD 11/01/2017, 8:18 AM  VAD Team --- VAD ISSUES ONLY--- Pager [phone] (7am - 7am)  Advanced Heart Failure Team  Pager [phone] (M-F; 7a - 4p)  Please contact [facility] for night-coverage after hours (4p -7a ) and weekends on [email or web address]

## 2017-11-01 NOTE — Progress Notes (Signed)
ANTICOAGULATION CONSULT NOTE - Follow Up Consult  Pharmacy Consult for Heparin and Warfarin Indication: LVAD  Allergies  Allergen Reactions  . Metformin And Related Diarrhea    Patient Measurements: Height: 5' 8"  (172.7 cm) Weight: 175 lb 7.1 oz (79.6 kg) IBW/kg (Calculated) : 68.4 Heparin Dosing Weight: 76kg  Vital Signs: Temp: 98.4 F (36.9 C) (02/27 0820) Temp Source: Oral (02/27 0820) BP: 92/56 (02/27 1000) Pulse Rate: 71 (02/27 1000)  Labs: Recent Labs    10/30/17 0145 10/30/17 1140 10/30/17 1830 10/31/17 0346 11/01/17 0400  HGB 10.7*  --   --  10.2* 9.4*  HCT 33.9*  --   --  32.5* 29.5*  PLT 182  --   --  185 194  LABPROT 20.5*  --   --  17.4* 15.9*  INR 1.77  --   --  1.44 1.28  HEPARINUNFRC 0.42 0.58  --  0.56 0.27*  CREATININE 7.66*  --   --  4.47* 6.47*  TROPONINI  --   --  0.07*  --   --     Estimated Creatinine Clearance: 12.8 mL/min (A) (by C-G formula based on SCr of 6.47 mg/dL (H)).  Medications: Heparin @ 1200 units/hr  Assessment: 70 yoM s/p Heartmate 3 LVAD 06/2017 admitted for heparin bridge with planned HD vascular access procedure on Tuesday 2/26. Last dose of warfarin was 2/22 and INR was 2.28 at that time. Heparin started once INR < 2.   S/P AVF graft placement yesterday and heparin/warfarin resumed. Heparin level slightly below goal at 0.27. INR down to 1.28. Hgb down 10.2 > 9.4, FOBT ordered. No overt bleeding. LDH ok.  PTA dose: 61m daily  Goal of Therapy:  INR 2-3 Heparin level 0.3-0.7 units/ml Monitor platelets by anticoagulation protocol: Yes   Plan:  1) Increase heparin to 1350 units/hr 2) Increase warfarin to 134mtonight 3) Daily heparin level, INR, CBC, LDH 4) Follow up FOBT  JeNena JordanPharmD, BCPS  11/01/2017 10:14 AM

## 2017-11-01 NOTE — Progress Notes (Signed)
Patient ID: [name], male   DOB: Jul 01, 1966, 52 y.o.   MRN: [id number]  [facility] Progress Note   Assessment/ Plan:   1. Chronic anticoagulation with LVAD device: Status post heparin bridge to allow for placement of right upper arm AV graft with ligation of brachial-brachial fistula. 2. End-stage renal disease:  hemodialysis today to continue a Monday/Wednesday/Friday schedule-will continue to exercise caution with regards to ultrafiltration goal/limiting intradialytic hypotension. At this time, I cannot auscultate a bruit in his right upper arm arteriovenous graft. 3. Hypotension: Chronic, fortunately with small ultrafiltration goals. 4. Ischemic cardiomyopathy, coronary artery disease status post LVAD and BiV- ICD 5. Anemia of chronic kidney disease: Hemoglobin currently acceptable, continue to monitor for overt losses/ESA needs. 6. Secondary hyperparathyroidism: Continue renal diet/low phosphorus diet. Restarted on calcitriol with hemodialysis. Not on phosphorus binder.  Subjective:   Reports a fair night-difficulty sleeping due to noise/activity in the hallways. Tolerable right upper arm pain.    Objective:   BP 94/80   Pulse 99   Temp 99 F (37.2 C) (Oral)   Resp 19   Ht 5' 8"  (1.727 m)   Wt 78.7 kg (173 lb 8 oz)   SpO2 97%   BMI 26.38 kg/m   Physical Exam: Gen: Comfortably resting in bed, getting ready for dialysis CVS: Pulse regular rhythm, normal rate, mechanical hum over precordium Resp: Anteriorly clear to auscultation, no rales/rhonchi Abd: Soft, flat, nontender Ext: No lower extremity edema, status post right upper arm AVG placement with no audible thrill.  2 postoperative drains in place connected to test tubes.  Labs: BMET Recent Labs  Lab 10/29/17 1400 10/30/17 0145 10/31/17 0346 11/01/17 0400  NA 133* 132* 135 135  K 4.4 4.8 4.8 4.9  CL 99* 97* 99* 100*  CO2 21* 23 26 26   GLUCOSE 183* 151* 118* 151*  BUN 56* 67* 29* 47*   CREATININE 7.18* 7.66* 4.47* 6.47*  CALCIUM 8.6* 8.6* 8.2* 8.3*  PHOS  --   --  3.0 4.1   CBC Recent Labs  Lab 10/29/17 1049 10/30/17 0145 10/31/17 0346 11/01/17 0400  WBC 6.1 7.8 11.3* 6.4  NEUTROABS 4.0  --   --   --   HGB 12.1* 10.7* 10.2* 9.4*  HCT 36.5* 33.9* 32.5* 29.5*  MCV 84.9 84.3 85.3 87.0  PLT 120* 182 185 194   Medications:    . calcitRIOL      . amLODipine  5 mg Oral Daily  . aspirin EC  81 mg Oral Daily  . atorvastatin  40 mg Oral q1800  . calcitRIOL  0.5 mcg Oral Q M,W,F-HD  . docusate sodium  200 mg Oral Daily  . dronabinol  2.5 mg Oral BID AC  . feeding supplement (NEPRO CARB STEADY)  237 mL Oral TID WC  . hydrALAZINE  37.5 mg Oral TID  . Influenza vac split quadrivalent PF  0.5 mL Intramuscular Tomorrow-1000  . insulin aspart  0-15 Units Subcutaneous TID WC  . insulin aspart  0-5 Units Subcutaneous QHS  . insulin aspart  5 Units Subcutaneous TID WC  . insulin glargine  5 Units Subcutaneous Daily  . metoCLOPramide (REGLAN) injection  10 mg Intravenous Q6H  . pantoprazole  40 mg Oral Daily  . pneumococcal 23 valent vaccine  0.5 mL Intramuscular Tomorrow-1000  . sildenafil  20 mg Oral TID  . Warfarin - Pharmacist Dosing Inpatient   Does not apply [id number]   Elmarie Shiley, MD 11/01/2017, 8:02 AM

## 2017-11-01 NOTE — Progress Notes (Signed)
LVAD Coordinator Rounding Note:  Admitted 10/29/17 for heparin bridge for AV graft placement on 10/31/17 per Dr. Bridgett Larsson.    HM III LVAD implanted on 06/12/17 by Dr. Prescott Gum under Destination Therapy criteria due to renal insufficiency.  Pt has required dialysis 3 times weekly on MWF on OP basis. His initial right arm fistula failed and he will be having a right arm AV graft today per Dr. Bridgett Larsson. VAD Coordinator will accompany patient to surgery.  Pt reports some "soreness" of right upper arm incision sites. He denies any nausea, vomiting, or CP during night or this am. He is scheduled for HD today. Remains in sinus tach with occasional PVC this am.  Pt remains on Heparin gtt; warfarin re-started last night.   Vital signs: Temp: 98.4 HR: 101 Doppler Pressure:   Automatic BP:   O2 Sat: 97% RA Wt: 168>170>169>173 lbs   LVAD interrogation reveals:   Speed: 5600 Flow: 4.3 Power: 4.2 w PI: 3.9 Alarms: none Events:  15 PI events on 10/31/17 Hematocrit: 30 Fixed speed:  5600 Low speed limit: 5300   Drive Line: left abdominal dressing dry and intact; anchor intact. Pt is having weekly dressing changes; next dressing change 11/06/17.  Labs:  LDH trend: 206>207>212>183  INR trend: 1.86>1.77>1.44>1.28  Hgb trend:  12.1>10.7>10.2>9.4  Anticoagulation Plan: -INR Goal:  2.0 - 2.5 -ASA Dose: 81 mg daily  Device: - St Jude dual - Therapies: on at 200 bpm    Monitor: on slow VT 150 bpm  Adverse Events on VAD: - ESRD post LVAD; started CVVH 06/15/17; HD started 06/20/17 - Hospitalization 11/5 - 07/16/17 > gastroparesis diagnosis after EGD - Hospitalization 11/26 - 08/04/17 > gastroparesis - Hospitalization 1/8 - 09/16/17>gastroparesis - referred to Dr. Corliss Parish at [facility] - 10/31/17>right upper arm arteriovenous graft with Artegraft; ligation of right first stage brachial vein transposition     Plan/Recommendations:   1. HD scheduled at bedside today. 2. Please page VAD coordinator for  any VAD equipment or driveline issues.   Zada Girt RN, VAD Coordinator 24/7 VAD Pager: [phone]

## 2017-11-02 ENCOUNTER — Inpatient Hospital Stay (HOSPITAL_COMMUNITY): Payer: BLUE CROSS/BLUE SHIELD

## 2017-11-02 DIAGNOSIS — I361 Nonrheumatic tricuspid (valve) insufficiency: Secondary | ICD-10-CM

## 2017-11-02 LAB — FERRITIN: Ferritin: 523 ng/mL — ABNORMAL HIGH (ref 24–336)

## 2017-11-02 LAB — CBC
HEMATOCRIT: 28.6 % — AB (ref 39.0–52.0)
Hemoglobin: 8.7 g/dL — ABNORMAL LOW (ref 13.0–17.0)
MCH: 26.5 pg (ref 26.0–34.0)
MCHC: 30.4 g/dL (ref 30.0–36.0)
MCV: 87.2 fL (ref 78.0–100.0)
PLATELETS: 188 10*3/uL (ref 150–400)
RBC: 3.28 MIL/uL — ABNORMAL LOW (ref 4.22–5.81)
RDW: 17.2 % — AB (ref 11.5–15.5)
WBC: 7.7 10*3/uL (ref 4.0–10.5)

## 2017-11-02 LAB — RENAL FUNCTION PANEL
ANION GAP: 10 (ref 5–15)
Albumin: 2.5 g/dL — ABNORMAL LOW (ref 3.5–5.0)
BUN: 22 mg/dL — ABNORMAL HIGH (ref 6–20)
CALCIUM: 8.3 mg/dL — AB (ref 8.9–10.3)
CHLORIDE: 99 mmol/L — AB (ref 101–111)
CO2: 26 mmol/L (ref 22–32)
Creatinine, Ser: 4.58 mg/dL — ABNORMAL HIGH (ref 0.61–1.24)
GFR calc non Af Amer: 13 mL/min — ABNORMAL LOW (ref >60)
GFR, EST AFRICAN AMERICAN: 15 mL/min — AB (ref >60)
Glucose, Bld: 110 mg/dL — ABNORMAL HIGH (ref 65–99)
Phosphorus: 2.3 mg/dL — ABNORMAL LOW (ref 2.5–4.6)
Potassium: 4.1 mmol/L (ref 3.5–5.1)
SODIUM: 135 mmol/L (ref 135–145)

## 2017-11-02 LAB — GLUCOSE, CAPILLARY
GLUCOSE-CAPILLARY: 126 mg/dL — AB (ref 65–99)
GLUCOSE-CAPILLARY: 89 mg/dL (ref 65–99)
GLUCOSE-CAPILLARY: 112 mg/dL — AB (ref 65–99)
Glucose-Capillary: 90 mg/dL (ref 65–99)

## 2017-11-02 LAB — IRON AND TIBC
IRON: 40 ug/dL — AB (ref 45–182)
Saturation Ratios: 21 % (ref 17.9–39.5)
TIBC: 188 ug/dL — ABNORMAL LOW (ref 250–450)
UIBC: 148 ug/dL

## 2017-11-02 LAB — ECHOCARDIOGRAM LIMITED
Height: 68 in
Weight: 2758.4 oz

## 2017-11-02 LAB — LACTATE DEHYDROGENASE: LDH: 181 U/L (ref 98–192)

## 2017-11-02 LAB — PROTIME-INR
INR: 1.34
Prothrombin Time: 16.5 seconds — ABNORMAL HIGH (ref 11.4–15.2)

## 2017-11-02 LAB — HEPARIN LEVEL (UNFRACTIONATED): Heparin Unfractionated: 0.35 IU/mL (ref 0.30–0.70)

## 2017-11-02 MED ORDER — WARFARIN SODIUM 10 MG PO TABS
10.0000 mg | ORAL_TABLET | Freq: Once | ORAL | Status: AC
  Administered 2017-11-02: 10 mg via ORAL
  Filled 2017-11-02: qty 1

## 2017-11-02 MED ORDER — DARBEPOETIN ALFA 100 MCG/0.5ML IJ SOSY
100.0000 ug | PREFILLED_SYRINGE | INTRAMUSCULAR | Status: DC
  Administered 2017-11-03: 100 ug via INTRAVENOUS
  Filled 2017-11-02: qty 0.5

## 2017-11-02 NOTE — Progress Notes (Signed)
  Echocardiogram 2D Echocardiogram has been performed.  Jennette Dubin 11/02/2017, 3:58 PM

## 2017-11-02 NOTE — Progress Notes (Signed)
LVAD Coordinator Rounding Note:  Admitted 10/29/17 for heparin bridge for AV graft placement on 10/31/17 per Dr. Bridgett Larsson.    HM III LVAD implanted on 06/12/17 by Dr. Prescott Gum under Destination Therapy criteria due to renal insufficiency.  Pt has required dialysis 3 times weekly on MWF on OP and IP basis. He denies any nausea, vomiting, or CP during dialysis yesterday. Remains in sinus tach with frequent PVCs today.  Pt remains on Heparin gtt; warfarin re-started. Pt accidentally pulled one drain from right upper arm incision site, one drain remains intact.   Vital signs: Temp:  99.1 HR:  103 Doppler Pressure:  90/73 (81) Automatic BP:  80 O2 Sat: 94% RA Wt: 168>170>169>173>170>173 lbs   LVAD interrogation reveals:  Speed: 5500 Flow: 4.6 Power: 4.0 w PI: 2.4 Alarms: none Events:  6 PI events on 11/01/17 Hematocrit: 28 Fixed speed:  5500 Low speed limit: 5200   Drive Line: left abdominal dressing dry and intact; anchor intact. Pt is having weekly dressing changes; next dressing change 11/06/17.  Labs:  LDH trend: 206>207>212>183>181  INR trend: 1.86>1.77>1.44>1.28>1.34  Hgb trend:  12.1>10.7>10.2>9.4>8.7  Anticoagulation Plan: -INR Goal:  2.0 - 2.5 -ASA Dose: 81 mg daily  Device: - St Jude dual - Therapies: on at 200 bpm    Monitor: on slow VT 150 bpm  Adverse Events on VAD: - ESRD post LVAD; started CVVH 06/15/17; HD started 06/20/17 - Hospitalization 11/5 - 07/16/17 > gastroparesis diagnosis after EGD - Hospitalization 11/26 - 08/04/17 > gastroparesis - Hospitalization 1/8 - 09/16/17>gastroparesis - referred to Dr. Corliss Parish at [facility] - 10/31/17>right upper arm arteriovenous graft with Artegraft; ligation of right first stage brachial vein transposition     Plan/Recommendations:   1. Repeat bedside ramp echo off dialysis planned for 3 pm today. 2. Please page VAD coordinator for any VAD equipment or driveline issues.   Zada Girt RN, VAD Coordinator 24/7 VAD Pager:  [phone]

## 2017-11-02 NOTE — Progress Notes (Addendum)
Patient ID: [name], male   DOB: 1965/11/22, 52 y.o.   MRN: [id number]   Advanced Heart Failure VAD Team Note  Cardiologist: Aundra Dubin  Subjective:    Patient had surgery on right upper arm 2/26 for AV graft placement.  He tolerated procedure well without problems.    LVAD speed turned down to 5500 yesterday during HD.  Less PI events yesterday after turning down speed.   MAP stable in 80s.   Hemoglobin lower again at 8.7. Iron stores slightly low.  INR 1.34 today, on heparin gtt and warfarin.   LVAD INTERROGATION:  HeartMate 3 LVAD:  Flow 4.2 liters/min, speed 5500, power 4.0, PI 3.58. 5-7 PIs.    Objective:    Vital Signs:   Temp:  [98.2 F (36.8 C)-101.4 F (38.6 C)] 100.1 F (37.8 C) (02/28 0752) Pulse Rate:  [56-119] 103 (02/28 0359) Resp:  [10-24] 14 (02/28 0752) BP: (77-124)/(46-110) 90/73 (02/28 0752) SpO2:  [92 %-100 %] 94 % (02/28 0752) Weight:  [170 lb 6.7 oz (77.3 kg)-172 lb 6.4 oz (78.2 kg)] 172 lb 6.4 oz (78.2 kg) (02/28 0536) Last BM Date: 11/01/17 Mean arterial Pressure 80s  Intake/Output:   Intake/Output Summary (Last 24 hours) at 11/02/2017 0918 Last data filed at 11/02/2017 0400 Gross per 24 hour  Intake 1204.35 ml  Output 1000 ml  Net 204.35 ml     Physical Exam    GENERAL: Well appearing this am. NAD.  HEENT: Normal. NECK: Supple, JVP 7-8 cm. Carotids OK. Slight oozing around L neck central line.  CARDIAC:  Mechanical heart sounds with LVAD hum present.  LUNGS:  CTAB, normal effort.  ABDOMEN:  NT, ND, no HSM. No bruits or masses. +BS  LVAD exit site: Well-healed and incorporated. Dressing dry and intact. No erythema or drainage. Stabilization device present and accurately applied. Driveline dressing changed daily per sterile technique. EXTREMITIES:  Warm and dry. No cyanosis, clubbing, rash, or edema.  NEUROLOGIC:  Alert & oriented x 3. Cranial nerves grossly intact. Moves all 4 extremities w/o difficulty. Affect pleasant     Telemetry     NSR 90-100s, personally reviewed.   Labs   Basic Metabolic Panel: Recent Labs  Lab 10/29/17 1400 10/30/17 0145 10/31/17 0346 11/01/17 0400 11/02/17 0330  NA 133* 132* 135 135 135  K 4.4 4.8 4.8 4.9 4.1  CL 99* 97* 99* 100* 99*  CO2 21* 23 26 26 26   GLUCOSE 183* 151* 118* 151* 110*  BUN 56* 67* 29* 47* 22*  CREATININE 7.18* 7.66* 4.47* 6.47* 4.58*  CALCIUM 8.6* 8.6* 8.2* 8.3* 8.3*  MG 1.8  --   --   --   --   PHOS  --   --  3.0 4.1 2.3*    Liver Function Tests: Recent Labs  Lab 10/29/17 1400 10/31/17 0346 11/01/17 0400 11/02/17 0330  AST 33  --   --   --   ALT 35  --   --   --   ALKPHOS 101  --   --   --   BILITOT 0.4  --   --   --   PROT 6.6  --   --   --   ALBUMIN 3.0* 2.7* 2.6* 2.5*   No results for input(s): LIPASE, AMYLASE in the last 168 hours. No results for input(s): AMMONIA in the last 168 hours.  CBC: Recent Labs  Lab 10/29/17 1049 10/30/17 0145 10/31/17 0346 11/01/17 0400 11/02/17 0330  WBC 6.1 7.8 11.3* 6.4 7.7  NEUTROABS 4.0  --   --   --   --   HGB 12.1* 10.7* 10.2* 9.4* 8.7*  HCT 36.5* 33.9* 32.5* 29.5* 28.6*  MCV 84.9 84.3 85.3 87.0 87.2  PLT 120* 182 185 194 188    INR: Recent Labs  Lab 10/29/17 1400 10/30/17 0145 10/31/17 0346 11/01/17 0400 11/02/17 0330  INR 1.86 1.77 1.44 1.28 1.34    Other results:  EKG:    Imaging   No results found.   Medications:     Scheduled Medications: . amLODipine  5 mg Oral Once per day on Sun Tue Thu Sat  . aspirin EC  81 mg Oral Daily  . atorvastatin  40 mg Oral q1800  . calcitRIOL  0.5 mcg Oral Q M,W,F-HD  . [START ON 11/03/2017] darbepoetin (ARANESP) injection - DIALYSIS  100 mcg Intravenous Q Fri-HD  . docusate sodium  200 mg Oral Daily  . dronabinol  2.5 mg Oral BID AC  . feeding supplement (NEPRO CARB STEADY)  237 mL Oral TID WC  . hydrALAZINE  37.5 mg Oral 3 times per day on Sun Tue Thu Sat  . Influenza vac split quadrivalent PF  0.5 mL Intramuscular Tomorrow-1000  .  insulin aspart  0-15 Units Subcutaneous TID WC  . insulin aspart  0-5 Units Subcutaneous QHS  . insulin aspart  5 Units Subcutaneous TID WC  . insulin glargine  5 Units Subcutaneous Daily  . metoCLOPramide (REGLAN) injection  10 mg Intravenous Q6H  . pantoprazole  40 mg Oral Daily  . pneumococcal 23 valent vaccine  0.5 mL Intramuscular Tomorrow-1000  . sildenafil  20 mg Oral TID  . sodium chloride flush  10-40 mL Intracatheter Q12H  . Warfarin - Pharmacist Dosing Inpatient   Does not apply q1800    Infusions: . sodium chloride    . sodium chloride    . heparin 1,350 Units/hr (11/02/17 0527)    PRN Medications: sodium chloride, sodium chloride, acetaminophen, alteplase, heparin, lidocaine (PF), lidocaine-prilocaine, LORazepam, ondansetron (ZOFRAN) IV, ondansetron, pentafluoroprop-tetrafluoroeth, promethazine, promethazine, sodium chloride flush    Assessment/Plan:    1 ESRD: Admitted for AV graft placement and heparin bridge. Graft placed 2/27, has good flow per vascular.   - HD M/W/F.   2. Chronic systolic HF: Ischemic cardiomyopathy. St Jude ICD. NSTEMI in March 2018 with DES to LAD and RCA, complicated by cardiogenic shock, low output requiring milrinone. Echo 8/18 with EF 20-25%, moderate MR. CPX (8/18) with severe functional impairment due to HF.  s/p HM-3 VAD in 10/18. NYHA II with VAD. MAP in 80s generally.  - Volume status managed with HD.  - VAD interrogated personally. Parameters remain stable.  3. CAD: NSTEMI in 3/18. LHC with 99% ulcerated lesion proximal RCA with left to right collaterals, 95% mid LAD stenosis after mid LAD stent. s/p PCI to RCA and LAD on 11/25/16.  - No s/s of ischemia.    - Continue aspirin and statin.  4. LVAD present - VAD interrogated personally. Parameters stable.  - Driveline OK.  - LDH Stable.  - MAPs steady in 80s.  - He is back on heparin/warfarin overlap, stop heparin and send home when INR gets to 1.8.  - Will check ramp echo  today. 5. h/o DVTs: Factor V Leiden heterozygote. - On coumadin. Bridging with heparin  6. Diabetic gastroparesis:Difficult to manage.He has seen gastroparesis specialist at [facility] and workup is continuing. No N/V today.  - He has ongoing workup with gastroparesis specialist at [facility].  - Continue reglan 5 mg tid/ac 7. SK:[id number] is now well-controlled. - SSI while in house.  8. HTN: - Amlodipine and Hydral moved to non-HD days.  9. PAD:  - Moderate bilateral disease on ABIs in 8/18. Denies claudication currently, no pedal ulcers. No change.  10. Atrial Flutter-Paroxysmal  - Remains NSR today.  11. Anemia:  - Hgb down again to 8.7 today.  - Iron and TIBC low. Sat ratio 21%. (WNL)  Plan ramp Echo today.   I reviewed the LVAD parameters from today, and compared the results to the patient's prior recorded data.  No programming changes were made.  The LVAD is functioning within specified parameters.  The patient performs LVAD self-test daily.  LVAD interrogation was negative for any significant power changes, alarms or PI events/speed drops.  LVAD equipment check completed and is in good working order.  Back-up equipment present.   LVAD education done on emergency procedures and precautions and reviewed exit site care.   Length of Stay: 4  Annamaria Helling 11/02/2017, 9:18 AM  VAD Team --- VAD ISSUES ONLY--- Pager [phone] (7am - 7am)  Advanced Heart Failure Team  Pager [phone] (M-F; 7a - 4p)  Please contact [facility] for night-coverage after hours (4p -7a ) and weekends on [email or web address]  Patient seen with PA, agree with the above note.  Yesterday, speed of LVAD turned down to 5500 rpm.  He has had fewer PIs after the change. He says that he does not feel any different.  No abdominal pain/nausea.  Walking without dyspnea.  His hgb is down to 8.7. He had a bowel movement last night without melena or hematochezia per nurse's report but not sent for hemoccult.     LVAD parameters reviewed and stable.  Fewer PI events.  Not volume overloaded on exam.    We are planning to get an echo to reassess septal position and cannula when he is not on HD (did echo on HD yesterday with decrease in speed).   Fall in hgb is concerning but no overt bleeding.  Need to hemoccult stool.  ?Anemia of renal disease.  For now, continue heparin/warfarin overlap until INR > 1.8.   MAP stable in 80s.    No symptoms of gastroparesis currently.   Mauricio Dahlen 11/02/2017 10:43 AM

## 2017-11-02 NOTE — Progress Notes (Signed)
Patient ID: [name], male   DOB: [birth year], 52 y.o.   MRN: [id number]  [facility] Progress Note   Assessment/ Plan:   1. Chronic anticoagulation with LVAD device: Status post heparin bridge to allow for placement of right upper arm AV graft with ligation of brachial-brachial fistula. Now resumed on coumadin (INR 1.4) 2. End-stage renal disease:  hemodialysis tomorrow to continue a Monday/Wednesday/Friday schedule-will limit ultrafiltration goal to limit intradialytic hypotension. S/p RUA AVG (artegraft) with doppler audible signal per vascular surgery. 3. Hypotension: Chronic, euvolemic and would likely benefit from lowered hydralazine dose. 4. Ischemic cardiomyopathy, coronary artery disease status post LVAD and BiV- ICD 5. Anemia of chronic kidney disease: Hemoglobin lower post-op, Aranesp with HD tomorrow. 6. Secondary hyperparathyroidism: Continue renal diet/low phosphorus diet. Restarted on calcitriol with hemodialysis. Not on phosphorus binder.  Subjective:   Reports increased pain over right arm/surgical site overnight. Denies any chest pain or shortness of breath    Objective:   BP (!) 84/68 (BP Location: Left Arm)   Pulse (!) 103   Temp 99.7 F (37.6 C) (Oral)   Resp (!) 22   Ht 5' 8"  (1.727 m)   Wt 78.2 kg (172 lb 6.4 oz)   SpO2 92%   BMI 26.21 kg/m   Physical Exam: Gen: Comfortably resting in bed CVS: Pulse regular rhythm, normal rate, mechanical hum over precordium, LAVD tubing in situ Resp: Anteriorly clear to auscultation, no rales/rhonchi Abd: Soft, flat, nontender Ext: No lower extremity edema, status post right upper arm AVG placement with no audible thrill, mildly increased swelling since yesterday.  2 postoperative drains in place connected to test tubes.  Labs: BMET Recent Labs  Lab 10/29/17 1400 10/30/17 0145 10/31/17 0346 11/01/17 0400 11/02/17 0330  NA 133* 132* 135 135 135  K 4.4 4.8 4.8 4.9 4.1  CL 99* 97* 99* 100* 99*   CO2 21* 23 26 26 26   GLUCOSE 183* 151* 118* 151* 110*  BUN 56* 67* 29* 47* 22*  CREATININE 7.18* 7.66* 4.47* 6.47* 4.58*  CALCIUM 8.6* 8.6* 8.2* 8.3* 8.3*  PHOS  --   --  3.0 4.1 2.3*   CBC Recent Labs  Lab 10/29/17 1049 10/30/17 0145 10/31/17 0346 11/01/17 0400 11/02/17 0330  WBC 6.1 7.8 11.3* 6.4 7.7  NEUTROABS 4.0  --   --   --   --   HGB 12.1* 10.7* 10.2* 9.4* 8.7*  HCT 36.5* 33.9* 32.5* 29.5* 28.6*  MCV 84.9 84.3 85.3 87.0 87.2  PLT 120* 182 185 194 188   Medications:    . amLODipine  5 mg Oral Once per day on Sun Tue Thu Sat  . aspirin EC  81 mg Oral Daily  . atorvastatin  40 mg Oral q1800  . calcitRIOL  0.5 mcg Oral Q M,W,F-HD  . docusate sodium  200 mg Oral Daily  . dronabinol  2.5 mg Oral BID AC  . feeding supplement (NEPRO CARB STEADY)  237 mL Oral TID WC  . hydrALAZINE  37.5 mg Oral 3 times per day on Sun Tue Thu Sat  . Influenza vac split quadrivalent PF  0.5 mL Intramuscular Tomorrow-1000  . insulin aspart  0-15 Units Subcutaneous TID WC  . insulin aspart  0-5 Units Subcutaneous QHS  . insulin aspart  5 Units Subcutaneous TID WC  . insulin glargine  5 Units Subcutaneous Daily  . metoCLOPramide (REGLAN) injection  10 mg Intravenous Q6H  . pantoprazole  40 mg Oral Daily  . pneumococcal  23 valent vaccine  0.5 mL Intramuscular Tomorrow-1000  . sildenafil  20 mg Oral TID  . sodium chloride flush  10-40 mL Intracatheter Q12H  . Warfarin - Pharmacist Dosing Inpatient   Does not apply C3013   Elmarie Shiley, MD 11/02/2017, 7:48 AM

## 2017-11-02 NOTE — Progress Notes (Signed)
Speed  Flow  PI  Power  LVIDD  AI  Aortic openings  MR  TR  Septum  RV   5500 4.2 3.2 4.0 3.94 trace 2/5  trace trace Bowing to left dilated   5600  4.58 3.2 4.2 3.76 trace                                                            Doppler MAP: 74   Ramp ECHO performed at bedside.  At completion of ramp study, we will leave the pt at 5500 as he only had 5 PI events during dialysis yesterday.  Fixed speed: 5300 Low speed limit: 5000  [name] RN, VAD Coordinator 24/7 pager [phone]

## 2017-11-02 NOTE — Progress Notes (Signed)
ANTICOAGULATION CONSULT NOTE - Follow Up Consult  Pharmacy Consult for Heparin and Warfarin Indication: LVAD  Allergies  Allergen Reactions  . Metformin And Related Diarrhea    Patient Measurements: Height: 5' 8"  (172.7 cm) Weight: 172 lb 6.4 oz (78.2 kg) IBW/kg (Calculated) : 68.4 Heparin Dosing Weight: 76kg  Vital Signs: Temp: 99.1 F (37.3 C) (02/28 1133) Temp Source: Oral (02/28 1133) BP: 80/68 (02/28 1133) Pulse Rate: 103 (02/28 0359)  Labs: Recent Labs    10/30/17 1830  10/31/17 0346 11/01/17 0400 11/02/17 0330  HGB  --    < > 10.2* 9.4* 8.7*  HCT  --   --  32.5* 29.5* 28.6*  PLT  --   --  185 194 188  LABPROT  --   --  17.4* 15.9* 16.5*  INR  --   --  1.44 1.28 1.34  HEPARINUNFRC  --   --  0.56 0.27* 0.35  CREATININE  --   --  4.47* 6.47* 4.58*  TROPONINI 0.07*  --   --   --   --    < > = values in this interval not displayed.    Estimated Creatinine Clearance: 18 mL/min (A) (by C-G formula based on SCr of 4.58 mg/dL (H)).  Medications: Heparin @ 1350 units/hr  Assessment: 42 yoM s/p Heartmate 3 LVAD 06/2017 admitted for heparin bridge with planned HD vascular access procedure on Tuesday 2/26. Last dose of warfarin was 2/22 and INR was 2.28 at that time. Heparin started once INR < 2.   S/P AVF graft placement 2/26 and heparin/warfarin resumed. Heparin level at goal 0.35. INR starting to trend up to 1.34. Hgb continues to trend down 10.2 > 9.4 >8.7, FOBT ordered. No overt bleeding. LDH ok.  PTA dose: 72m daily  Goal of Therapy:  INR 2-3 Heparin level 0.3-0.7 units/ml Monitor platelets by anticoagulation protocol: Yes   Plan:  1) Continue heparin at 1350 units/hr 2) Repeat warfarin 158mtonight 3) Daily heparin level, INR, CBC, LDH 4) Follow up FOBT  JeNena JordanPharmD, BCPS  11/02/2017 11:35 AM

## 2017-11-02 NOTE — Progress Notes (Signed)
Pt called RN into the room to "look at something." When RN went in pt room, one of the OR drainage tubes was laying in the bed next to the pt. RN paged Bridgett Larsson, MD. MD stated to make sure there was not suture still in the skin and to place a dry dressing over exit site. RN will continue to monitor site.

## 2017-11-03 LAB — RENAL FUNCTION PANEL
ALBUMIN: 2.4 g/dL — AB (ref 3.5–5.0)
Anion gap: 11 (ref 5–15)
BUN: 39 mg/dL — AB (ref 6–20)
CHLORIDE: 98 mmol/L — AB (ref 101–111)
CO2: 25 mmol/L (ref 22–32)
Calcium: 8.1 mg/dL — ABNORMAL LOW (ref 8.9–10.3)
Creatinine, Ser: 6.89 mg/dL — ABNORMAL HIGH (ref 0.61–1.24)
GFR calc Af Amer: 9 mL/min — ABNORMAL LOW (ref >60)
GFR calc non Af Amer: 8 mL/min — ABNORMAL LOW (ref >60)
GLUCOSE: 157 mg/dL — AB (ref 65–99)
POTASSIUM: 4.4 mmol/L (ref 3.5–5.1)
Phosphorus: 2.2 mg/dL — ABNORMAL LOW (ref 2.5–4.6)
Sodium: 134 mmol/L — ABNORMAL LOW (ref 135–145)

## 2017-11-03 LAB — CBC
HCT: 26.3 % — ABNORMAL LOW (ref 39.0–52.0)
HEMOGLOBIN: 8.2 g/dL — AB (ref 13.0–17.0)
MCH: 27.2 pg (ref 26.0–34.0)
MCHC: 31.2 g/dL (ref 30.0–36.0)
MCV: 87.1 fL (ref 78.0–100.0)
Platelets: 214 10*3/uL (ref 150–400)
RBC: 3.02 MIL/uL — ABNORMAL LOW (ref 4.22–5.81)
RDW: 17 % — ABNORMAL HIGH (ref 11.5–15.5)
WBC: 8.3 10*3/uL (ref 4.0–10.5)

## 2017-11-03 LAB — GLUCOSE, CAPILLARY
GLUCOSE-CAPILLARY: 60 mg/dL — AB (ref 65–99)
Glucose-Capillary: 122 mg/dL — ABNORMAL HIGH (ref 65–99)
Glucose-Capillary: 98 mg/dL (ref 65–99)
Glucose-Capillary: 109 mg/dL — ABNORMAL HIGH (ref 65–99)
Glucose-Capillary: 161 mg/dL — ABNORMAL HIGH (ref 65–99)

## 2017-11-03 LAB — HEPARIN LEVEL (UNFRACTIONATED)
HEPARIN UNFRACTIONATED: 0.21 [IU]/mL — AB (ref 0.30–0.70)
Heparin Unfractionated: 0.4 IU/mL (ref 0.30–0.70)

## 2017-11-03 LAB — LACTATE DEHYDROGENASE: LDH: 171 U/L (ref 98–192)

## 2017-11-03 LAB — PROTIME-INR
INR: 1.5
PROTHROMBIN TIME: 18 s — AB (ref 11.4–15.2)

## 2017-11-03 IMAGING — US US ABDOMEN LIMITED
1 series · 14 of 25 positions shown · non-contrast
Comparison: CT abdomen and pelvis 02/11/2008. Abdominal radiographs
04/05/2017

CLINICAL DATA: Nausea and vomiting for 3 days. History of CHF,
Crohn's disease, hypertension, diabetes. Previous colectomy,
appendectomy, hernia repair.

EXAM:
ULTRASOUND ABDOMEN LIMITED RIGHT UPPER QUADRANT

[Series 1: us abdomen limited · 0.22mm/px · 14 of 48 slices shown]
[im 1/48]
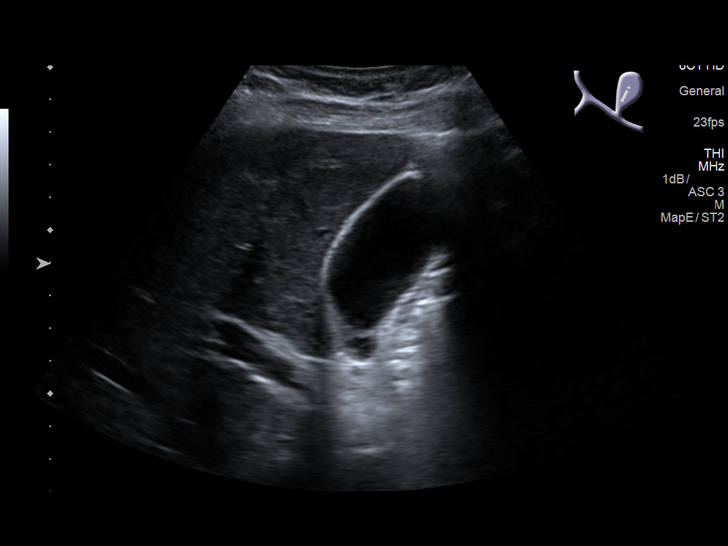
[im 4/48]
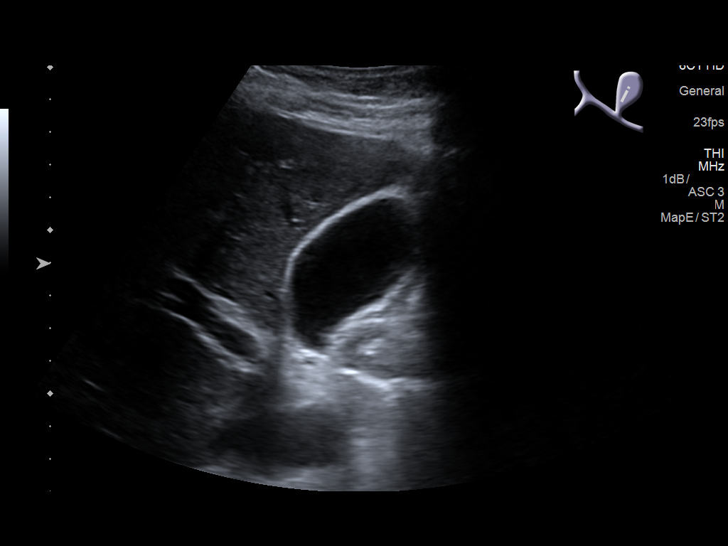
[im 8/48]
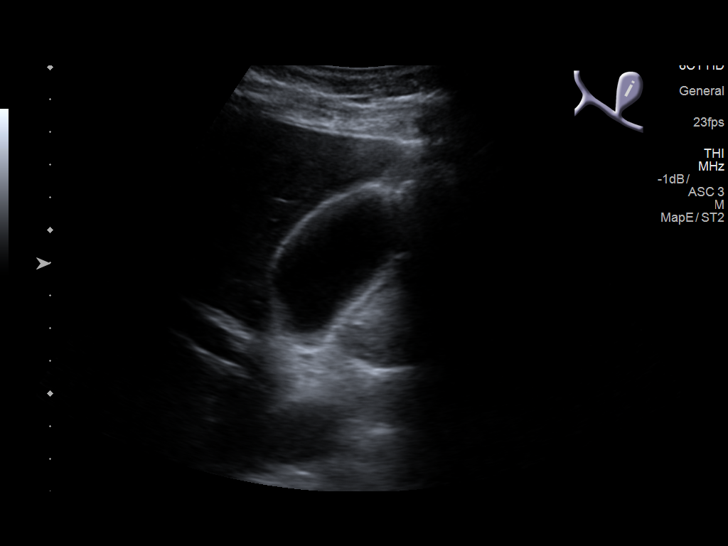
[im 12/48]
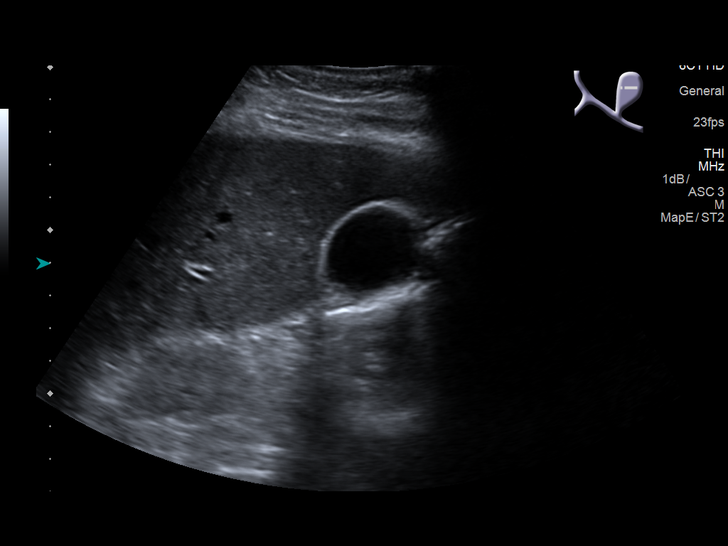
[im 16/48]
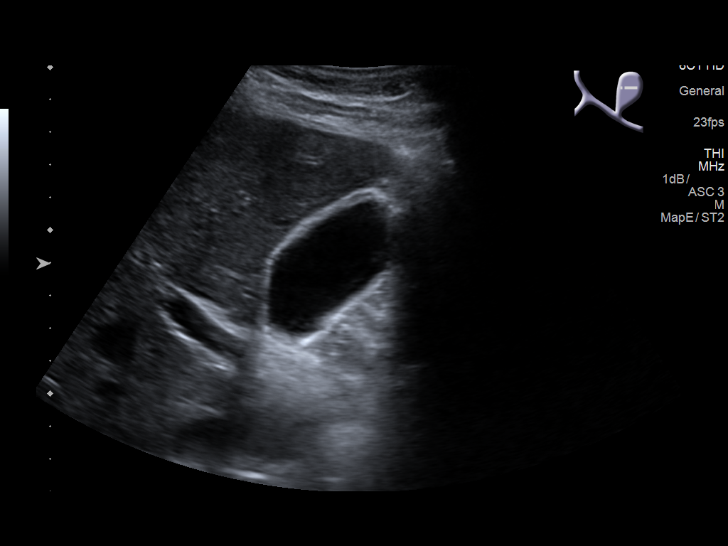
[im 18/48]
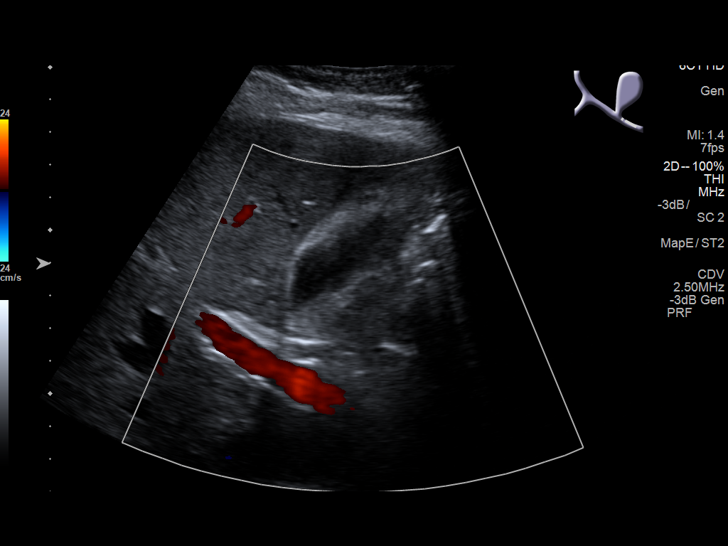
[im 22/48]
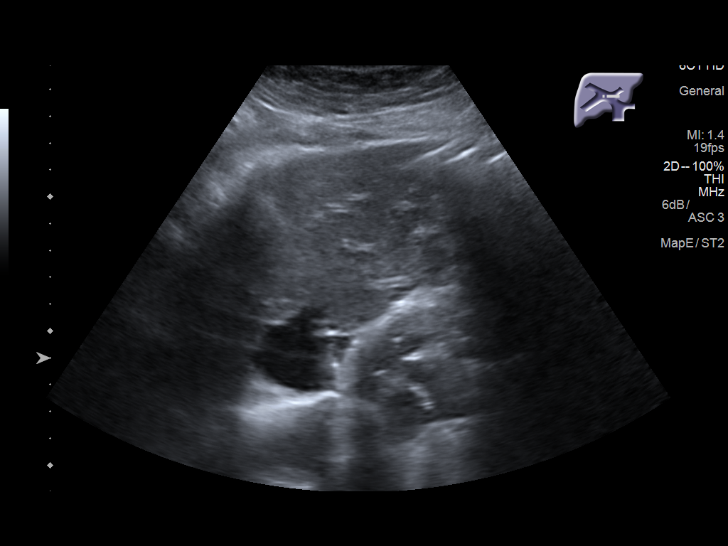
[im 26/48]
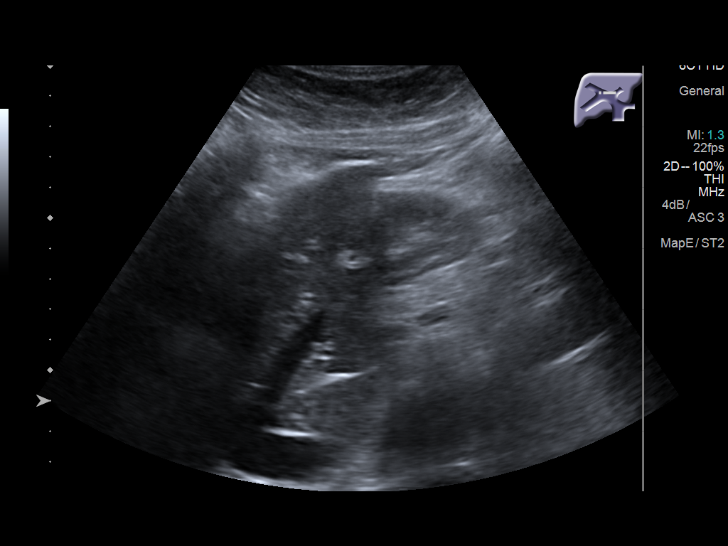
[im 30/48]
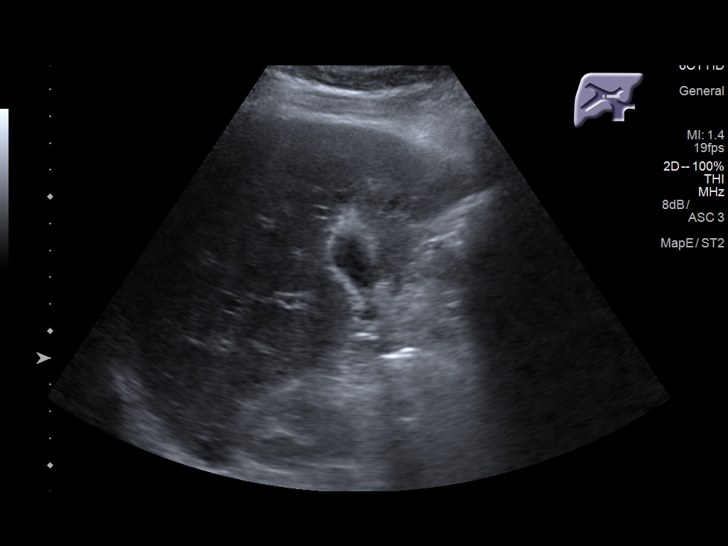
[im 32/48]
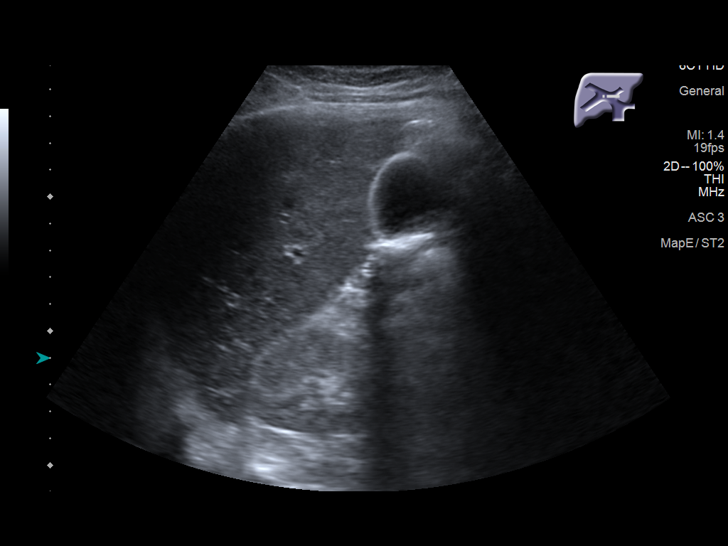
[im 36/48]
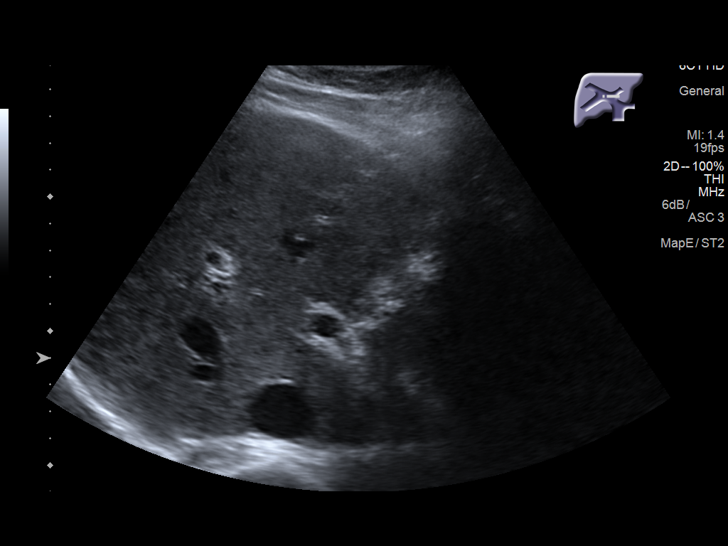
[im 40/48]
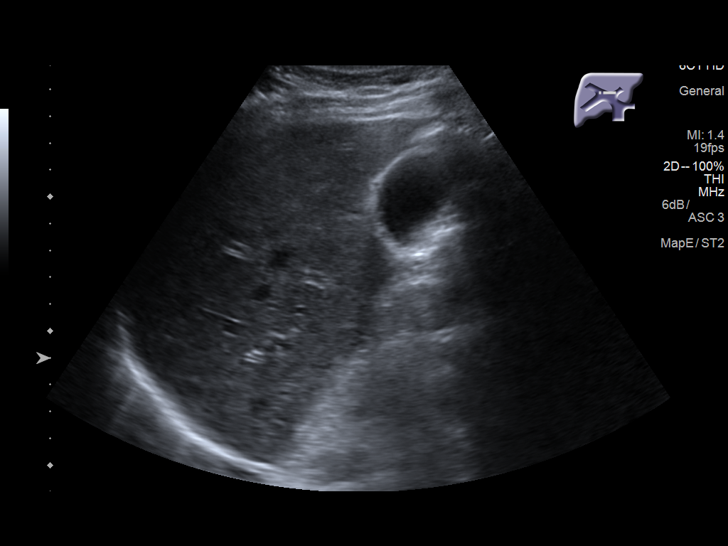
[im 44/48]
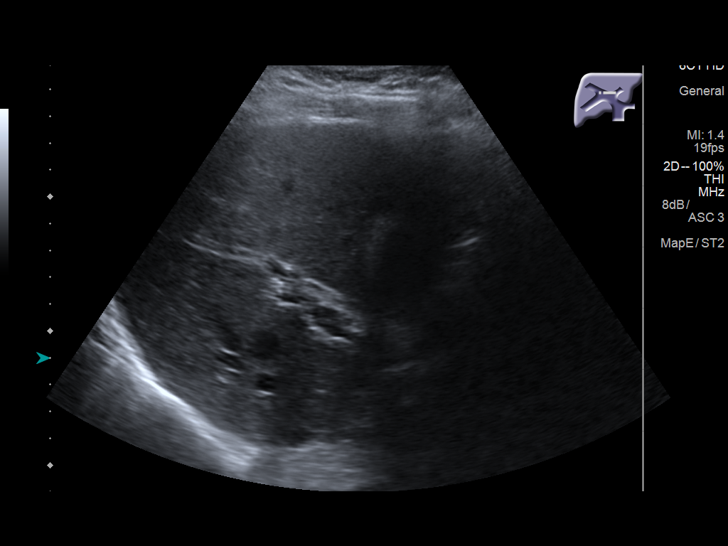
[im 48/48]
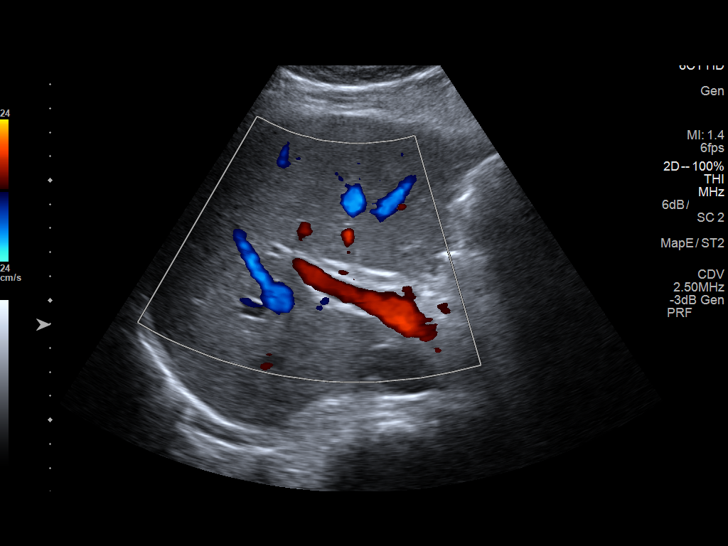

[14 of 25 positions shown; findings below may reference images not displayed]

FINDINGS: Gallbladder:

No gallstones or wall thickening visualized. No sonographic Murphy
sign noted by sonographer.

Common bile duct:

Diameter: 4.1 mm, normal

Liver:

Heterogeneous increased parenchymal echotexture suggesting fatty
infiltration of the liver. No focal lesions identified. Limited
visualization of the liver due to rib shadowing.
IMPRESSION: No evidence of cholelithiasis or cholecystitis. Probable fatty
infiltration of the liver.

## 2017-11-03 MED ORDER — METOCLOPRAMIDE HCL 5 MG/ML IJ SOLN
5.0000 mg | Freq: Four times a day (QID) | INTRAMUSCULAR | Status: DC
Start: 2017-11-03 — End: 2017-11-04
  Administered 2017-11-03 – 2017-11-04 (×5): 5 mg via INTRAVENOUS
  Filled 2017-11-03 (×5): qty 2

## 2017-11-03 MED ORDER — DARBEPOETIN ALFA 100 MCG/0.5ML IJ SOSY
PREFILLED_SYRINGE | INTRAMUSCULAR | Status: AC
  Administered 2017-11-03: 100 ug via INTRAVENOUS
  Filled 2017-11-03: qty 0.5

## 2017-11-03 MED ORDER — MORPHINE SULFATE (PF) 2 MG/ML IV SOLN
1.0000 mg | INTRAVENOUS | Status: DC | PRN
  Administered 2017-11-03: 1 mg via INTRAVENOUS
  Filled 2017-11-03: qty 1

## 2017-11-03 MED ORDER — WARFARIN SODIUM 10 MG PO TABS
10.0000 mg | ORAL_TABLET | Freq: Once | ORAL | Status: AC
  Administered 2017-11-03: 10 mg via ORAL

## 2017-11-03 NOTE — Progress Notes (Signed)
LVAD Coordinator Rounding Note:  Admitted 10/29/17 for heparin bridge for AV graft placement on 10/31/17 per Dr. Bridgett Larsson.    HM III LVAD implanted on 06/12/17 by Dr. Prescott Gum under Destination Therapy criteria due to renal insufficiency.  Pt has required dialysis 3 times weekly on MWF on OP and IP basis. Dialysis today.  Pt remains on Heparin gtt; warfarin re-started.   Vital signs: Temp:  99.5 HR:  95 Doppler Pressure:  84 Automatic BP:  97/76 (84) O2 Sat: 96% RA Wt: 168>170>169>173>170>173>177 lbs   LVAD interrogation reveals:  Speed: 5500 Flow: 4.1 Power: 4.0 w PI: 3.4 Alarms: none Events:  5-10 PI events on 11/02/17 Hematocrit: 26 Fixed speed:  5500 Low speed limit: 5200   Drive Line: left abdominal dressing dry and intact; anchor intact. Pt is having weekly dressing changes; next dressing change 11/06/17.  Labs:  LDH trend: 206>207>212>183>181>171  INR trend: 1.86>1.77>1.44>1.28>1.34>1.50  Hgb trend:  12.1>10.7>10.2>9.4>8.7>8.2  Anticoagulation Plan: -INR Goal:  2.0 - 2.5 -ASA Dose: 81 mg daily  Device: - St Jude dual - Therapies: on at 200 bpm    Monitor: on slow VT 150 bpm  Adverse Events on VAD: - ESRD post LVAD; started CVVH 06/15/17; HD started 06/20/17 - Hospitalization 11/5 - 07/16/17 > gastroparesis diagnosis after EGD - Hospitalization 11/26 - 08/04/17 > gastroparesis - Hospitalization 1/8 - 09/16/17>gastroparesis - referred to Dr. Corliss Parish at [facility] - 10/31/17>right upper arm arteriovenous graft with Artegraft; ligation of right first stage brachial vein transposition     Plan/Recommendations:   1. Follow up made for pt today in case he is d/c tomorrow. Orders faxed to dialysis for CBC/INR on Wednesday 11/08/17. Hospital f/u 3/14 @ 0900 in VAD clinic.  2. Please page VAD coordinator for any VAD equipment or driveline issues.   [name] RN, VAD Coordinator 24/7 VAD Pager: [phone]

## 2017-11-03 NOTE — Discharge Summary (Addendum)
Advanced Heart Failure Team  Discharge Summary   Patient ID: [name] MRN: [id number], DOB/AGE: 05-09-1966 52 y.o. Admit date: 10/29/2017 D/C date:   11/04/2017   Primary Discharge Diagnoses:  1 ESRD: Admitted for AV graft placement and heparin bridge.  2.Chronic systolic HF: Ischemic cardiomyopathy. St Jude ICD. S/p HM-3 VAD in 10/18.NYHA II with VAD.  3. CAD: NSTEMI in 3/18 4. LVAD present 5. h/o DVTs: Factor V Leiden heterozygote 6. Diabetic gastroparesis 7. DM2 8. HTN 9. PAD: 10. Atrial Flutter-Paroxysmal 11. Anemia  Interval history today:  Today: Feels fine. Eager to go home. Stil a bit sore at AVF site. No bleeding. No SOB. INR 1.82 on heparin. VAD interrogated personally. Parameters stable.  LVAD INTERROGATION:  HeartMate 3 LVAD:  Flow 4.2 liters/min, speed 5500, power 4.0, PI 3.3.  occasional PIs.  (Personally reviewed)   Hospital Course:   [name] is a 52 y.o. male with history of CAD s/p anterior MI in 2010 and NSTEMI in 3/18 with DES to pRCA and mLAD and ischemic cardiomyopathy now s/p Heartmate 3 LVAD in 10/18. Post-op course c/b acute on chronic renal failure ->  ESRD. Now on HD M/W/F. S/p STJ ICD.   Pt admitted 10/29/17 for bridging off coumadin for placement of AV graft. Problem based hospital course as below.   1 ESRD:  - Admitted for AV graft placement and heparin bridge. Graft placed 2/27, has good flow per vascular.  - Some oozing around site, but otherwise post op course uncomplicated from surgical stand-point.  - Remains on HD M/W/F.  2.Chronic systolic HF: Ischemic cardiomyopathy. St Jude ICD. s/p HM-3 VAD in 10/18.NYHA II with VAD.  - MAPs stable this admission with med adjustments as below/#8.  Volume status managed by HD.  3. CAD: NSTEMI in 3/18 - No s/s of ischemia this admission.  4. LVAD present -VAD interrogated personally by VAD trained provider each day this admission.  - Speed turned down during HD 11/01/17 to 5500.  Continued at this speed on discharge. PI events much improved.  5. h/o DVTs: Factor V Leiden heterozygote. - Bridged with heparin while off coumadin.  6. Diabetic gastroparesis: - Follows with gastroparesis specialist at [facility] and workup is continuing. Intermittent episodes of N/V during this admission. Reglan dose adjusted for renal. Marinol added.  7. DM:  - HgbA1c well controlled. Covered with SSI in house. Resume home meds for d/c.  8. HTN: - Amlodipine and Hydralazine moved to NON-HD days. MAPs mostly stable in 80s.  9. PAD:  - Moderate bilateral disease on ABIs in 8/18. Denies claudication currently, no pedal ulcers. Chronic and stable this admission.  10. Atrial Flutter-Paroxysmal - NSR throughout this admission.  11. Anemia:  - Had gradual downtrend this admission with no overt bleeding. Iron and TIBC low. Sat ratio 21%. (WNL).  - Suspect anemia of CKD as component. Aranesp started per renal.   Hospital course complicated by anemia thought to be secondary to his CKD with no active bleeding . Will have close lab follow up next week.   Pt held in hospital into weekend while his INR came up to therapeutic range.  He will be discharged today to home in stable condition with close follow up as below.     Discharge Weight: 178 Discharge Vitals: Blood pressure 105/80, pulse 87, temperature 98.7 F (37.1 C), temperature source Oral, resp. rate 18, height 5' 8"  (1.727 m), weight 178 lb 8 oz (81 kg), SpO2 97 %.  General:  NAD.  HEENT: normal  Neck: supple. JVP 9-10.  Carotids 2+ bilat; no bruits. No lymphadenopathy or thryomegaly appreciated. Cor: LVAD hum.  Lungs: Clear. Abdomen: soft, nontender, non-distended. No hepatosplenomegaly. No bruits or masses. Good bowel sounds. Driveline site clean. Anchor in place.  Extremities: no cyanosis, clubbing, rash. Warm no edema   LUE AVF site ok Neuro: alert & oriented x 3. No focal deficits. Moves all 4 without problem     Labs: Lab Results  Component Value Date   WBC 7.2 11/04/2017   HGB 8.4 (L) 11/04/2017   HCT 27.3 (L) 11/04/2017   MCV 87.2 11/04/2017   PLT 215 11/04/2017    Recent Labs  Lab 10/29/17 1400  11/04/17 0243  NA 133*   < > 136  K 4.4   < > 3.8  CL 99*   < > 99*  CO2 21*   < > 27  BUN 56*   < > 17  CREATININE 7.18*   < > 4.12*  CALCIUM 8.6*   < > 8.2*  PROT 6.6  --   --   BILITOT 0.4  --   --   ALKPHOS 101  --   --   ALT 35  --   --   AST 33  --   --   GLUCOSE 183*   < > 151*   < > = values in this interval not displayed.   Lab Results  Component Value Date   CHOL 128 05/04/2017   HDL 50 05/04/2017   LDLCALC 54 05/04/2017   TRIG 118 05/04/2017   BNP (last 3 results) Recent Labs    06/18/17 2305 06/26/17 0004 07/03/17 0037  BNP 746.0* 1,409.3* 758.9*    ProBNP (last 3 results) No results for input(s): PROBNP in the last 8760 hours.   Diagnostic Studies/Procedures   No results found.  Discharge Medications   Allergies as of 11/04/2017      Reactions   Metformin And Related Diarrhea      Medication List    TAKE these medications   amLODipine 5 MG tablet Commonly known as:  NORVASC Take 1 tablet (5 mg total) by mouth daily. On non dialysis days.   aspirin EC 81 MG tablet Take 1 tablet (81 mg total) by mouth daily.   atorvastatin 40 MG tablet Commonly known as:  LIPITOR Take 1 tablet (40 mg total) daily at 6 PM by mouth.   docusate sodium 100 MG capsule Commonly known as:  COLACE Take 2 capsules (200 mg total) by mouth daily.   dronabinol 2.5 MG capsule Commonly known as:  MARINOL Take 1 capsule (2.5 mg total) by mouth 2 (two) times daily before lunch and supper.   feeding supplement (NEPRO CARB STEADY) Liqd Take 237 mLs by mouth 3 (three) times daily with meals. What changed:  when to take this   [place] 1 reader to the skin every 10 days.   glucose blood test strip Commonly known as:  FREESTYLE  PRECISION NEO TEST Use as instructed   hydrALAZINE 25 MG tablet Commonly known as:  APRESOLINE Take 1.5 tablets (37.5 mg total) by mouth 3 (three) times daily. Take on non-hd days   insulin aspart 100 UNIT/ML injection Commonly known as:  novoLOG Inject 3 Units into the skin 3 (three) times daily with meals. What changed:  how much to take   insulin glargine 100 UNIT/ML injection Commonly known as:  LANTUS Inject 0.05 mLs (  5 Units total) into the skin daily. What changed:  how much to take   LORazepam 0.5 MG tablet Commonly known as:  ATIVAN Take 1 tablet (0.5 mg total) by mouth every 12 (twelve) hours as needed for anxiety or sleep.   metoCLOPramide 5 MG tablet Commonly known as:  REGLAN Take 1 tablet (5 mg total) by mouth 4 (four) times daily -  before meals and at bedtime. What changed:    how much to take  when to take this   ondansetron 4 MG disintegrating tablet Commonly known as:  ZOFRAN ODT Take 1 tablet (4 mg total) by mouth every 6 (six) hours as needed for nausea or vomiting.   pantoprazole 40 MG tablet Commonly known as:  PROTONIX Take 40 mg by mouth daily.   promethazine 12.5 MG tablet Commonly known as:  PHENERGAN Take 1 tablet (12.5 mg total) by mouth every 6 (six) hours as needed for nausea or vomiting. What changed:  how much to take   sildenafil 20 MG tablet Commonly known as:  REVATIO Take 1 tablet (20 mg total) by mouth 3 (three) times daily.   warfarin 5 MG tablet Commonly known as:  COUMADIN Take as directed. If you are unsure how to take this medication, talk to your nurse or doctor. Original instructions:  Take 5 mg by mouth daily.       Disposition   The patient will be discharged in stable condition to home.  Follow-up Information    Conrad [place], MD Follow up in 1 week(s).   Specialties:  Vascular Surgery, Cardiology Why:  Office will call you to arrange your appt (sent) Contact information: [place]  [phone]        [facility]. Go on 11/16/2017.   Specialty:  Cardiology Why:  Appointment w/ Dr. Aundra Dubin and VAD Coordinators at [facility] will have labs drawn at dialysis on wednesday 11/08/17 to check your INR and CBC.  Contact information: [address] [id number] [place] [phone]            Duration of Discharge Encounter: Greater than 35 minutes   Signed, [name], MD  9:52 PM

## 2017-11-03 NOTE — Progress Notes (Addendum)
Patient ID: [name], male   DOB: September 22, 1965, 52 y.o.   MRN: [id number]   Advanced Heart Failure VAD Team Note  Cardiologist: Aundra Dubin  Subjective:    Patient had surgery on right upper arm 2/26 for AV graft placement.  He tolerated procedure well without problems.    LVAD speed turned down to 5500 11/01/17 during HD.  Less PI events since.   MAP stable in 80s.   Feeling good this am. No further N/V since earlier this week. Denies CP. BM yesterday "normal" brown color. No overt bleeding.   Hemoglobin continues to trend down to 8.2. INR up to 1.5 today. On heparin gtt and warfarin.   LVAD INTERROGATION:  HeartMate 3 LVAD:  Flow 4.0 liters/min, speed 5500, power 4.0, PI 3.5. 3-5 PIs.    Objective:    Vital Signs:   Temp:  [99.1 F (37.3 C)-99.9 F (37.7 C)] 99.5 F (37.5 C) (03/01 0823) Pulse Rate:  [95] 95 (03/01 0823) Resp:  [12-41] 22 (03/01 0823) BP: (80-106)/(65-79) 94/76 (03/01 0823) SpO2:  [96 %] 96 % (03/01 0823) Weight:  [177 lb 6.4 oz (80.5 kg)] 177 lb 6.4 oz (80.5 kg) (03/01 0549) Last BM Date: 11/02/17 Mean arterial Pressure 80s  Intake/Output:   Intake/Output Summary (Last 24 hours) at 11/03/2017 0839 Last data filed at 11/03/2017 0400 Gross per 24 hour  Intake 334 ml  Output -  Net 334 ml     Physical Exam    GENERAL: Well appearing this am. NAD.  HEENT: Normal. NECK: Supple, JVP 7-8 cm. Carotids OK.  CARDIAC:  Mechanical heart sounds with LVAD hum present.  LUNGS:  CTAB, normal effort.  ABDOMEN:  NT, ND, no HSM. No bruits or masses. +BS  LVAD exit site: Well-healed and incorporated. Dressing dry and intact. No erythema or drainage. Stabilization device present and accurately applied. Driveline dressing changed daily per sterile technique. EXTREMITIES:  Warm and dry. No cyanosis, clubbing, rash, or edema.  NEUROLOGIC:  Alert & oriented x 3. Cranial nerves grossly intact. Moves all 4 extremities w/o difficulty. Affect pleasant     Telemetry    NSR 90-100s, personally reviewed.   Labs   Basic Metabolic Panel: Recent Labs  Lab 10/29/17 1400 10/30/17 0145 10/31/17 0346 11/01/17 0400 11/02/17 0330 11/03/17 0425  NA 133* 132* 135 135 135 134*  K 4.4 4.8 4.8 4.9 4.1 4.4  CL 99* 97* 99* 100* 99* 98*  CO2 21* 23 26 26 26 25   GLUCOSE 183* 151* 118* 151* 110* 157*  BUN 56* 67* 29* 47* 22* 39*  CREATININE 7.18* 7.66* 4.47* 6.47* 4.58* 6.89*  CALCIUM 8.6* 8.6* 8.2* 8.3* 8.3* 8.1*  MG 1.8  --   --   --   --   --   PHOS  --   --  3.0 4.1 2.3* 2.2*    Liver Function Tests: Recent Labs  Lab 10/29/17 1400 10/31/17 0346 11/01/17 0400 11/02/17 0330 11/03/17 0425  AST 33  --   --   --   --   ALT 35  --   --   --   --   ALKPHOS 101  --   --   --   --   BILITOT 0.4  --   --   --   --   PROT 6.6  --   --   --   --   ALBUMIN 3.0* 2.7* 2.6* 2.5* 2.4*   No results for input(s): LIPASE, AMYLASE in the last  168 hours. No results for input(s): AMMONIA in the last 168 hours.  CBC: Recent Labs  Lab 10/29/17 1049 10/30/17 0145 10/31/17 0346 11/01/17 0400 11/02/17 0330 11/03/17 0425  WBC 6.1 7.8 11.3* 6.4 7.7 8.3  NEUTROABS 4.0  --   --   --   --   --   HGB 12.1* 10.7* 10.2* 9.4* 8.7* 8.2*  HCT 36.5* 33.9* 32.5* 29.5* 28.6* 26.3*  MCV 84.9 84.3 85.3 87.0 87.2 87.1  PLT 120* 182 185 194 188 214    INR: Recent Labs  Lab 10/30/17 0145 10/31/17 0346 11/01/17 0400 11/02/17 0330 11/03/17 0425  INR 1.77 1.44 1.28 1.34 1.50    Other results:  EKG:   Imaging   No results found.  Medications:     Scheduled Medications: . amLODipine  5 mg Oral Once per day on Sun Tue Thu Sat  . aspirin EC  81 mg Oral Daily  . atorvastatin  40 mg Oral q1800  . calcitRIOL  0.5 mcg Oral Q M,W,F-HD  . darbepoetin (ARANESP) injection - DIALYSIS  100 mcg Intravenous Q Fri-HD  . docusate sodium  200 mg Oral Daily  . dronabinol  2.5 mg Oral BID AC  . feeding supplement (NEPRO CARB STEADY)  237 mL Oral TID WC  . hydrALAZINE  37.5  mg Oral 3 times per day on Sun Tue Thu Sat  . Influenza vac split quadrivalent PF  0.5 mL Intramuscular Tomorrow-1000  . insulin aspart  0-15 Units Subcutaneous TID WC  . insulin aspart  0-5 Units Subcutaneous QHS  . insulin aspart  5 Units Subcutaneous TID WC  . insulin glargine  5 Units Subcutaneous Daily  . metoCLOPramide (REGLAN) injection  10 mg Intravenous Q6H  . pantoprazole  40 mg Oral Daily  . pneumococcal 23 valent vaccine  0.5 mL Intramuscular Tomorrow-1000  . sildenafil  20 mg Oral TID  . sodium chloride flush  10-40 mL Intracatheter Q12H  . warfarin  10 mg Oral ONCE-1800  . Warfarin - Pharmacist Dosing Inpatient   Does not apply q1800    Infusions: . sodium chloride    . sodium chloride    . heparin 1,450 Units/hr (11/03/17 0800)    PRN Medications: sodium chloride, sodium chloride, acetaminophen, alteplase, heparin, lidocaine (PF), lidocaine-prilocaine, LORazepam, ondansetron (ZOFRAN) IV, ondansetron, pentafluoroprop-tetrafluoroeth, promethazine, promethazine, sodium chloride flush    Assessment/Plan:    1 ESRD: Admitted for AV graft placement and heparin bridge. Graft placed 2/27, has good flow per vascular.   - HD M/W/F.  2. Chronic systolic HF: Ischemic cardiomyopathy. St Jude ICD. NSTEMI in March 2018 with DES to LAD and RCA, complicated by cardiogenic shock, low output requiring milrinone. Echo 8/18 with EF 20-25%, moderate MR. CPX (8/18) with severe functional impairment due to HF.  s/p HM-3 VAD in 10/18. NYHA II with VAD.  - MAP in 80s generally.  - Volume status managed with HD.   - VAD interrogated personally. Parameters remain stable.  3. CAD: NSTEMI in 3/18. LHC with 99% ulcerated lesion proximal RCA with left to right collaterals, 95% mid LAD stenosis after mid LAD stent. s/p PCI to RCA and LAD on 11/25/16.  - No s/s of ischemia.  - Continue aspirin and statin.  4. LVAD present - VAD interrogated personally. Parameters stable.  - Driveline OK.   - LDH Stable.  - MAPs in 80s.  - He is back on heparin/warfarin overlap, stop heparin and send home when INR gets to 1.8. Possibly over weekend. -  Ramp echo 11/02/17. Speed set at 5500. 5. h/o DVTs: Factor V Leiden heterozygote. - On coumadin. Bridging with heparin. No change.  6. Diabetic gastroparesis:Difficult to manage.He has seen gastroparesis specialist at [facility] and workup is continuing. No N/V today. Worse on HD days.  - He has ongoing workup with gastroparesis specialist at [facility] 5 mg tid/ac 7. YE:[id number] is now well-controlled. - SSI while in house. No change.  8. HTN: - Amlodipine and Hydral moved to non-HD days. MAPs stable in 80s.  9. PAD:  - Moderate bilateral disease on ABIs in 8/18. Denies claudication currently, no pedal ulcers. No change.  10. Atrial Flutter-Paroxysmal  - Remains NSR today.  11. Anemia:  - Hgb trending down to 8.2. No FOBT collected yet.  - Iron and TIBC low. Sat ratio 21%. (WNL) - Suspect anemia of CKD as component. Getting Aranesp with HD today.  I reviewed the LVAD parameters from today, and compared the results to the patient's prior recorded data.  No programming changes were made.  The LVAD is functioning within specified parameters.  The patient performs LVAD self-test daily.  LVAD interrogation was negative for any significant power changes, alarms or PI events/speed drops.  LVAD equipment check completed and is in good working order.  Back-up equipment present.   LVAD education done on emergency procedures and precautions and reviewed exit site care.   Length of Stay: 5  Annamaria Helling 11/03/2017, 8:39 AM  VAD Team --- VAD ISSUES ONLY--- Pager [phone] ([place])  Advanced Heart Failure Team  Pager [phone] (M-F; 7a - 4p)  Please contact [facility] for night-coverage after hours (4p -7a ) and weekends on [email or web address]  Patient seen with PA, agree with the above note.  INR 1.5 today.  Hgb  down slightly to 8.2.  He had another BM yesterday, no BRBPR or melena (was unfortunately not sent for hemoccult).  He is getting HD today.   Considerably decreased PI events with lower speed.   On exam, no JVD, no edema, normal LVAD sounds.   Continue heparin gtt until INR > 1.8, then can go home.  Anemia may be due to chronic renal disease.  He is getting Aranesp today.  If hgb < 8 tomorrow would give 1 unit PRBCs.  Still trying to get hemoccult of stools, no overt bleeding.   Thedore Pickel 11/03/2017 11:40 AM

## 2017-11-03 NOTE — Progress Notes (Signed)
ANTICOAGULATION CONSULT NOTE - Follow Up Consult  Pharmacy Consult for Heparin and Warfarin Indication: LVAD  Allergies  Allergen Reactions  . Metformin And Related Diarrhea    Patient Measurements: Height: 5' 8"  (172.7 cm) Weight: 172 lb 6.4 oz (78.2 kg) IBW/kg (Calculated) : 68.4 Heparin Dosing Weight: 76kg  Vital Signs: Temp: 99.9 F (37.7 C) (02/28 2340) Temp Source: Oral (02/28 2340) BP: 106/79 (02/28 2340)  Labs: Recent Labs    11/01/17 0400 11/02/17 0330 11/03/17 0425  HGB 9.4* 8.7* 8.2*  HCT 29.5* 28.6* 26.3*  PLT 194 188 214  LABPROT 15.9* 16.5* 18.0*  INR 1.28 1.34 1.50  HEPARINUNFRC 0.27* 0.35 0.21*  CREATININE 6.47* 4.58*  --     Estimated Creatinine Clearance: 18 mL/min (A) (by C-G formula based on SCr of 4.58 mg/dL (H)).  Medications: Heparin @ 1350 units/hr  Assessment: 67 yoM s/p Heartmate 3 LVAD 06/2017 admitted for heparin bridge with planned HD vascular access procedure on Tuesday 2/26. Last dose of warfarin was 2/22 and INR was 2.28 at that time. Heparin started once INR < 2.   S/P AVF graft placement 2/26 and heparin/warfarin resumed.   Heparin level below goal this morning at 0.21. INR starting to trend up to 1.5. Hgb continues to trend down 10.2 > 9.4 >8.7>8.2, FOBT ordered but does not appear to be collected. No overt bleeding. LDH pending and has been stable.  PTA dose: 74m daily  Goal of Therapy:  INR 2-3 Heparin level 0.3-0.7 units/ml Monitor platelets by anticoagulation protocol: Yes   Plan:  1) Increase heparin conservatively to 1450 units/hr 2) Repeat warfarin 151mtonight 3) Daily heparin level, INR, CBC, LDH 4) Follow up FOBT if sent  FrErin HearingharmD., BCPS Clinical Pharmacist 11/03/2017 4:59 AM

## 2017-11-03 NOTE — Progress Notes (Signed)
Arrived to patient room 2C-17 at 1000.  Reviewed treatment plan and this RN agrees with plan.  Report received from bedside RN, Whitney.  Consent verified.  Patient A & O X 4.   Lung sounds diminished to ausculation in all fields. RUE non pitting edema. Cardiac:  NSR, LVAD.  Removed caps and cleansed RIJ catheter with chlorhedxidine.  Aspirated ports of heparin and flushed them with saline per protocol.  Connected and secured lines, initiated treatment at 1015.  UF Goal of 1700 mL and net fluid removal 1.2 L.  Will continue to monitor.

## 2017-11-03 NOTE — Progress Notes (Signed)
Patient ID: [name], male   DOB: [birth year], 52 y.o.   MRN: [id number]  [facility] Progress Note   Assessment/ Plan:   1. Chronic anticoagulation with LVAD device: Coumadin transiently held and patient placed on heparin bridge to allow for dialysis access placement-now awaiting therapeutic INR back on Coumadin prior to discharge 2. End-stage renal disease:  continue a Monday/Wednesday/Friday schedule- ultrafiltration goal based on input/output. S/p RUA AVG (artegraft) with doppler audible signal per vascular surgery-not able to auscultate at bedside. 3. Hypotension: Chronic, euvolemic and limiting ultrafiltration goals at dialysis. 4. Ischemic cardiomyopathy, coronary artery disease status post LVAD and BiV- ICD 5. Anemia of chronic kidney disease: Hemoglobin lower post-op/trending down, Aranesp with HD today. 6. Secondary hyperparathyroidism: Continue renal diet/low phosphorus diet. Restarted on calcitriol with hemodialysis. Not on phosphorus binder.  Subjective:   Denies any chest pain or shortness of breath, tolerable right upper arm pain without increased swelling.    Objective:   BP 93/65   Pulse (!) 103   Temp 99.9 F (37.7 C) (Oral)   Resp 19   Ht 5' 8"  (1.727 m)   Wt 80.5 kg (177 lb 6.4 oz)   SpO2 94%   BMI 26.97 kg/m   Physical Exam: Gen: Comfortably resting in bed CVS: Pulse regular rhythm, normal rate, mechanical hum over precordium, LAVD tubing in situ Resp: Anteriorly clear to auscultation, no rales/rhonchi Abd: Soft, flat, nontender Ext: No lower extremity edema, status post right upper arm AVG placement with no audible bruit, mild tenderness.  2 postoperative drains in place connected to test tubes.  Labs: BMET Recent Labs  Lab 10/29/17 1400 10/30/17 0145 10/31/17 0346 11/01/17 0400 11/02/17 0330 11/03/17 0425  NA 133* 132* 135 135 135 134*  K 4.4 4.8 4.8 4.9 4.1 4.4  CL 99* 97* 99* 100* 99* 98*  CO2 21* 23 26 26 26 25   GLUCOSE  183* 151* 118* 151* 110* 157*  BUN 56* 67* 29* 47* 22* 39*  CREATININE 7.18* 7.66* 4.47* 6.47* 4.58* 6.89*  CALCIUM 8.6* 8.6* 8.2* 8.3* 8.3* 8.1*  PHOS  --   --  3.0 4.1 2.3* 2.2*   CBC Recent Labs  Lab 10/29/17 1049  10/31/17 0346 11/01/17 0400 11/02/17 0330 11/03/17 0425  WBC 6.1   < > 11.3* 6.4 7.7 8.3  NEUTROABS 4.0  --   --   --   --   --   HGB 12.1*   < > 10.2* 9.4* 8.7* 8.2*  HCT 36.5*   < > 32.5* 29.5* 28.6* 26.3*  MCV 84.9   < > 85.3 87.0 87.2 87.1  PLT 120*   < > 185 194 188 214   < > = values in this interval not displayed.   Medications:    . amLODipine  5 mg Oral Once per day on Sun Tue Thu Sat  . aspirin EC  81 mg Oral Daily  . atorvastatin  40 mg Oral q1800  . calcitRIOL  0.5 mcg Oral Q M,W,F-HD  . darbepoetin (ARANESP) injection - DIALYSIS  100 mcg Intravenous Q Fri-HD  . docusate sodium  200 mg Oral Daily  . dronabinol  2.5 mg Oral BID AC  . feeding supplement (NEPRO CARB STEADY)  237 mL Oral TID WC  . hydrALAZINE  37.5 mg Oral 3 times per day on Sun Tue Thu Sat  . Influenza vac split quadrivalent PF  0.5 mL Intramuscular Tomorrow-1000  . insulin aspart  0-15 Units Subcutaneous TID WC  .  insulin aspart  0-5 Units Subcutaneous QHS  . insulin aspart  5 Units Subcutaneous TID WC  . insulin glargine  5 Units Subcutaneous Daily  . metoCLOPramide (REGLAN) injection  10 mg Intravenous Q6H  . pantoprazole  40 mg Oral Daily  . pneumococcal 23 valent vaccine  0.5 mL Intramuscular Tomorrow-1000  . sildenafil  20 mg Oral TID  . sodium chloride flush  10-40 mL Intracatheter Q12H  . warfarin  10 mg Oral ONCE-1800  . Warfarin - Pharmacist Dosing Inpatient   Does not apply [id number]   Elmarie Shiley, MD 11/03/2017, 8:10 AM

## 2017-11-03 NOTE — Progress Notes (Signed)
ANTICOAGULATION CONSULT NOTE - Follow Up Consult  Pharmacy Consult for Heparin and Warfarin Indication: LVAD  Allergies  Allergen Reactions  . Metformin And Related Diarrhea    Patient Measurements: Height: 5' 8"  (172.7 cm) Weight: 174 lb 9.7 oz (79.2 kg) IBW/kg (Calculated) : 68.4 Heparin Dosing Weight: 76kg  Vital Signs: Temp: 98.9 F (37.2 C) (03/01 1614) Temp Source: Oral (03/01 1614) BP: 101/83 (03/01 1614) Pulse Rate: 91 (03/01 1614)  Labs: Recent Labs    11/01/17 0400 11/02/17 0330 11/03/17 0425 11/03/17 1754  HGB 9.4* 8.7* 8.2*  --   HCT 29.5* 28.6* 26.3*  --   PLT 194 188 214  --   LABPROT 15.9* 16.5* 18.0*  --   INR 1.28 1.34 1.50  --   HEPARINUNFRC 0.27* 0.35 0.21* 0.40  CREATININE 6.47* 4.58* 6.89*  --     Estimated Creatinine Clearance: 12 mL/min (A) (by C-G formula based on SCr of 6.89 mg/dL (H)).  Medications: Heparin @ 1350 units/hr  Assessment: 3 yoM s/p Heartmate 3 LVAD 06/2017 admitted for heparin bridge with planned HD vascular access procedure on Tuesday 2/26. Last dose of warfarin was 2/22 and INR was 2.28 at that time. Heparin started once INR < 2.   S/P AVF graft placement 2/26 and heparin/warfarin resumed.   Heparin level = 0.4, therapeutic after rate increased to 1450 units/hr this morning.   Goal of Therapy:  INR 2-3 Heparin level 0.3-0.7 units/ml Monitor platelets by anticoagulation protocol: Yes   Plan:  1) Continue heparin at 1450 units/hr 2) Repeat warfarin 79m tonight - given 3) Daily heparin level, INR, CBC, LDH 4) Follow up FOBT if sent  MMaryanna Shape PharmD, BCPS  Clinical Pharmacist  Pager: [phone]  11/03/2017 6:52 PM

## 2017-11-03 NOTE — Progress Notes (Signed)
Dialysis treatment completed.  1700 mL ultrafiltrated.  1200 mL net fluid removal.  Patient status unchanged. Lung sounds diminished and clear to ausculation in all fields. RUE non pitting edema. Cardiac: NSR, LVAD.  Cleansed RIJ catheter with chlorhexidine.  Disconnected lines and flushed ports with saline per protocol.  Ports locked with heparin and capped per protocol.    Report given to bedside, RN Whitney.

## 2017-11-04 LAB — CBC WITH DIFFERENTIAL/PLATELET
BASOS PCT: 0 %
Basophils Absolute: 0 10*3/uL (ref 0.0–0.1)
EOS ABS: 0.4 10*3/uL (ref 0.0–0.7)
EOS PCT: 6 %
HCT: 27.3 % — ABNORMAL LOW (ref 39.0–52.0)
Hemoglobin: 8.4 g/dL — ABNORMAL LOW (ref 13.0–17.0)
Lymphocytes Relative: 21 %
Lymphs Abs: 1.5 10*3/uL (ref 0.7–4.0)
MCH: 26.8 pg (ref 26.0–34.0)
MCHC: 30.8 g/dL (ref 30.0–36.0)
MCV: 87.2 fL (ref 78.0–100.0)
Monocytes Absolute: 0.6 10*3/uL (ref 0.1–1.0)
Monocytes Relative: 8 %
NEUTROS PCT: 65 %
Neutro Abs: 4.7 10*3/uL (ref 1.7–7.7)
Platelets: 215 10*3/uL (ref 150–400)
RBC: 3.13 MIL/uL — AB (ref 4.22–5.81)
RDW: 16.8 % — ABNORMAL HIGH (ref 11.5–15.5)
WBC: 7.2 10*3/uL (ref 4.0–10.5)

## 2017-11-04 LAB — RENAL FUNCTION PANEL
ALBUMIN: 2.4 g/dL — AB (ref 3.5–5.0)
Anion gap: 10 (ref 5–15)
BUN: 17 mg/dL (ref 6–20)
CALCIUM: 8.2 mg/dL — AB (ref 8.9–10.3)
CO2: 27 mmol/L (ref 22–32)
Chloride: 99 mmol/L — ABNORMAL LOW (ref 101–111)
Creatinine, Ser: 4.12 mg/dL — ABNORMAL HIGH (ref 0.61–1.24)
GFR, EST AFRICAN AMERICAN: 18 mL/min — AB (ref >60)
GFR, EST NON AFRICAN AMERICAN: 15 mL/min — AB (ref >60)
Glucose, Bld: 151 mg/dL — ABNORMAL HIGH (ref 65–99)
PHOSPHORUS: 2.4 mg/dL — AB (ref 2.5–4.6)
Potassium: 3.8 mmol/L (ref 3.5–5.1)
Sodium: 136 mmol/L (ref 135–145)

## 2017-11-04 LAB — PROTIME-INR
INR: 1.82
Prothrombin Time: 20.9 seconds — ABNORMAL HIGH (ref 11.4–15.2)

## 2017-11-04 LAB — HEPARIN LEVEL (UNFRACTIONATED): HEPARIN UNFRACTIONATED: 0.24 [IU]/mL — AB (ref 0.30–0.70)

## 2017-11-04 LAB — GLUCOSE, CAPILLARY
GLUCOSE-CAPILLARY: 191 mg/dL — AB (ref 65–99)
GLUCOSE-CAPILLARY: 164 mg/dL — AB (ref 65–99)

## 2017-11-04 LAB — LACTATE DEHYDROGENASE: LDH: 173 U/L (ref 98–192)

## 2017-11-04 MED ORDER — WARFARIN SODIUM 7.5 MG PO TABS: 7.5000 mg | ORAL_TABLET | Freq: Once | ORAL | Status: DC

## 2017-11-04 NOTE — Progress Notes (Signed)
ANTICOAGULATION CONSULT NOTE - Follow Up Consult  Pharmacy Consult for Heparin and Warfarin Indication: LVAD  Allergies  Allergen Reactions  . Metformin And Related Diarrhea    Patient Measurements: Height: 5' 8"  (172.7 cm) Weight: 178 lb 8 oz (81 kg) IBW/kg (Calculated) : 68.4 Heparin Dosing Weight: 76kg  Vital Signs: Temp: 98.7 F (37.1 C) (03/02 0733) Temp Source: Oral (03/02 0733) BP: 104/80 (03/02 0733) Pulse Rate: 87 (03/02 0733)  Labs: Recent Labs    11/02/17 0330 11/03/17 0425 11/03/17 1754 11/04/17 0243  HGB 8.7* 8.2*  --  8.4*  HCT 28.6* 26.3*  --  27.3*  PLT 188 214  --  215  LABPROT 16.5* [name].0*  --  20.9*  INR 1.34 1.50  --  1.82  HEPARINUNFRC 0.35 0.21* 0.40 0.24*  CREATININE 4.58* 6.89*  --  4.12*    Estimated Creatinine Clearance: 20.1 mL/min (A) (by C-G formula based on SCr of 4.12 mg/dL (H)).  Medications: Heparin @ 1350 units/hr  Assessment: [name] s/p Heartmate 3 LVAD 06/2017 admitted for heparin bridge with planned HD vascular access procedure on Tuesday 2/26. Last dose of warfarin was 2/22 and INR was 2.28 at that time. Heparin started once INR < 2.   S/P AVF graft placement 2/26 and heparin/warfarin resumed.   Heparin level = 0.24 > heparin gtt d/c'd this AM.  LDH stable.  Goal of Therapy:  INR 2-3 Heparin level 0.3-0.7 units/ml Monitor platelets by anticoagulation protocol: Yes   Plan:  1) D/c IV heparin. 2) Coumadin 7.5 mg x 1 tonight, then reduce to PTA dose of 5 mg daily.   Uvaldo Rising, BCPS  Clinical Pharmacist Pager [phone]  11/04/2017 7:44 AM

## 2017-11-04 NOTE — Discharge Instructions (Signed)
DO NOT TAKE AMLODIPINE OR HYDRALAZINE ON THE DAYS YOU HAVE DIALYSIS.

## 2017-11-04 NOTE — Progress Notes (Signed)
Rt. Arm graft area no drainage but mild tenderness, continue to monitor, Removed dressing on Rt. Arm. There is drain on Rt. UA, Pt stated that doctor will remove next week. Removed Lt. IJ and applied dressing. Pt's received discharge instruction. Pt took his all belonging. HS [employer]

## 2017-11-04 NOTE — Progress Notes (Signed)
Patient ID: [name], male   DOB: 1966-07-02, 52 y.o.   MRN: [id number]  [facility] Progress Note   Assessment/ Plan:   1. Chronic anticoagulation with LVAD device: Coumadin transiently held and patient placed on heparin bridge to allow for dialysis access placement- INR today is 1.8 and he tells me that he will he discharged today.  2. End-stage renal disease:  continue on Monday/Wednesday/Friday schedule- ultrafiltration goal based on input/output (EDW 80.5kg with LVAD battery). S/p RUA AVG (artegraft) with doppler audible signal per vascular surgery-not audible to auscultation bedside. 3. Hypotension: Chronic, clinically euvolemic and limiting to ultrafiltration goals at dialysis. 4. Ischemic cardiomyopathy, coronary artery disease status post LVAD and BiV- ICD 5. Anemia of chronic kidney disease: Hemoglobin lower post-op/trending down, Aranesp with HD today. 6. Secondary hyperparathyroidism: Continue renal diet/low phosphorus diet. Continue calcitriol with hemodialysis. Not on phosphorus binder.  Subjective:   Still having some pain and discomfort over RUA AVG site.    Objective:   BP 104/80 (BP Location: Left Arm)   Pulse 87   Temp 98.7 F (37.1 C) (Oral)   Resp 18   Ht 5' 8"  (1.727 m)   Wt 81 kg (178 lb 8 oz)   SpO2 97%   BMI 27.14 kg/m   Physical Exam: Gen: Comfortably resting in bed CVS: Pulse regular rhythm, normal rate, mechanical hum over precordium, LAVD tubing in situ Resp: Anteriorly clear to auscultation, no rales/rhonchi Abd: Soft, flat, nontender Ext: No lower extremity edema, status post right upper arm AVG placement with no audible bruit, mild tenderness.  postoperative drain in place connected to test tube.  Labs: BMET Recent Labs  Lab 10/29/17 1400 10/30/17 0145 10/31/17 0346 11/01/17 0400 11/02/17 0330 11/03/17 0425 11/04/17 0243  NA 133* 132* 135 135 135 134* 136  K 4.4 4.8 4.8 4.9 4.1 4.4 3.8  CL 99* 97* 99* 100* 99* 98*  99*  CO2 21* 23 26 26 26 25 27   GLUCOSE 183* 151* 118* 151* 110* 157* 151*  BUN 56* 67* 29* 47* 22* 39* 17  CREATININE 7.18* 7.66* 4.47* 6.47* 4.58* 6.89* 4.12*  CALCIUM 8.6* 8.6* 8.2* 8.3* 8.3* 8.1* 8.2*  PHOS  --   --  3.0 4.1 2.3* 2.2* 2.4*   CBC Recent Labs  Lab 10/29/17 1049  11/01/17 0400 11/02/17 0330 11/03/17 0425 11/04/17 0243  WBC 6.1   < > 6.4 7.7 8.3 7.2  NEUTROABS 4.0  --   --   --   --  4.7  HGB 12.1*   < > 9.4* 8.7* 8.2* 8.4*  HCT 36.5*   < > 29.5* 28.6* 26.3* 27.3*  MCV 84.9   < > 87.0 87.2 87.1 87.2  PLT 120*   < > 194 188 214 215   < > = values in this interval not displayed.   Medications:    . amLODipine  5 mg Oral Once per day on Sun Tue Thu Sat  . aspirin EC  81 mg Oral Daily  . atorvastatin  40 mg Oral q1800  . calcitRIOL  0.5 mcg Oral Q M,W,F-HD  . darbepoetin (ARANESP) injection - DIALYSIS  100 mcg Intravenous Q Fri-HD  . docusate sodium  200 mg Oral Daily  . dronabinol  2.5 mg Oral BID AC  . feeding supplement (NEPRO CARB STEADY)  237 mL Oral TID WC  . hydrALAZINE  37.5 mg Oral 3 times per day on Sun Tue Thu Sat  . Influenza vac split quadrivalent PF  0.5 mL Intramuscular Tomorrow-1000  . insulin aspart  0-15 Units Subcutaneous TID WC  . insulin aspart  0-5 Units Subcutaneous QHS  . insulin aspart  5 Units Subcutaneous TID WC  . insulin glargine  5 Units Subcutaneous Daily  . metoCLOPramide (REGLAN) injection  5 mg Intravenous Q6H  . pantoprazole  40 mg Oral Daily  . pneumococcal 23 valent vaccine  0.5 mL Intramuscular Tomorrow-1000  . sildenafil  20 mg Oral TID  . sodium chloride flush  10-40 mL Intracatheter Q12H  . warfarin  7.5 mg Oral ONCE-1800  . Warfarin - Pharmacist Dosing Inpatient   Does not apply [id number]   Elmarie Shiley, MD 11/04/2017, 7:48 AM

## 2017-11-07 ENCOUNTER — Encounter: Payer: Self-pay | Admitting: *Deleted

## 2017-11-07 NOTE — Progress Notes (Signed)
E-mail sent(2/27 and 3/5) to Ronnie Derby with [facility] notified of patient's surgery and need for Rep to make contact at Dialysis center to instruct staff on accessing.

## 2017-11-08 LAB — PROTIME-INR: INR: 2.2 — AB (ref <=1.1)

## 2017-11-09 ENCOUNTER — Encounter: Payer: Self-pay | Admitting: Vascular Surgery

## 2017-11-09 ENCOUNTER — Ambulatory Visit (INDEPENDENT_AMBULATORY_CARE_PROVIDER_SITE_OTHER): Payer: BLUE CROSS/BLUE SHIELD | Admitting: Vascular Surgery

## 2017-11-09 VITALS — BP 126/82 | HR 90 | Temp 98.8°F | Resp 20 | Ht 68.0 in | Wt 177.0 lb

## 2017-11-09 DIAGNOSIS — N179 Acute kidney failure, unspecified: Secondary | ICD-10-CM

## 2017-11-09 NOTE — Progress Notes (Signed)
POST OPERATIVE OFFICE NOTE    CC:  F/u for surgery  HPI:  This is a 52 y.o. 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  He is here today for TLS drain removal due to full anticoagulation after surgery to protect the graft and incision.  He is doing well without new complaints.  Currently on HD via right TDC.    Allergies  Allergen Reactions  . Metformin And Related Diarrhea    Current Outpatient Medications  Medication Sig Dispense Refill  . amLODipine (NORVASC) 5 MG tablet Take 1 tablet (5 mg total) by mouth daily. On non dialysis days. 30 tablet 3  . aspirin EC 81 MG tablet Take 1 tablet (81 mg total) by mouth daily. 90 tablet 3  . atorvastatin (LIPITOR) 40 MG tablet Take 1 tablet (40 mg total) daily at 6 PM by mouth. 30 tablet 6  . Continuous Blood Gluc Sensor (FREESTYLE LIBRE SENSOR SYSTEM) MISC Apply 1 reader to the skin every 10 days. 3 each 11  . docusate sodium (COLACE) 100 MG capsule Take 2 capsules (200 mg total) by mouth daily. 10 capsule 0  . dronabinol (MARINOL) 2.5 MG capsule Take 1 capsule (2.5 mg total) by mouth 2 (two) times daily before lunch and supper. 60 capsule 1  . glucose blood (FREESTYLE PRECISION NEO TEST) test strip Use as instructed 100 each 12  . hydrALAZINE (APRESOLINE) 25 MG tablet Take 1.5 tablets (37.5 mg total) by mouth 3 (three) times daily. Take on non-hd days 135 tablet 3  . insulin aspart (NOVOLOG) 100 UNIT/ML injection Inject 3 Units into the skin 3 (three) times daily with meals. (Patient taking differently: Inject 5 Units into the skin 3 (three) times daily with meals. ) 1 vial 0  . insulin glargine (LANTUS) 100 UNIT/ML injection Inject 0.05 mLs (5 Units total) into the skin daily. (Patient taking differently: Inject 10 Units into the skin daily. ) 10 mL 11  . LORazepam (ATIVAN) 0.5 MG tablet Take 1 tablet (0.5 mg total) by mouth every 12 (twelve) hours as needed for anxiety or sleep. 30 tablet 0  . metoCLOPramide  (REGLAN) 5 MG tablet Take 1 tablet (5 mg total) by mouth 4 (four) times daily -  before meals and at bedtime. (Patient taking differently: Take 10 mg by mouth 3 (three) times daily. ) 130 tablet 6  . Nutritional Supplements (FEEDING SUPPLEMENT, NEPRO CARB STEADY,) LIQD Take 237 mLs by mouth 3 (three) times daily with meals. (Patient taking differently: Take 237 mLs by mouth daily. ) 90 Can 0  . ondansetron (ZOFRAN ODT) 4 MG disintegrating tablet Take 1 tablet (4 mg total) by mouth every 6 (six) hours as needed for nausea or vomiting. 40 tablet 1  . pantoprazole (PROTONIX) 40 MG tablet Take 40 mg by mouth daily.    . promethazine (PHENERGAN) 12.5 MG tablet Take 1 tablet (12.5 mg total) by mouth every 6 (six) hours as needed for nausea or vomiting. (Patient taking differently: Take 25 mg by mouth every 6 (six) hours as needed for nausea or vomiting. ) 30 tablet 0  . sildenafil (REVATIO) 20 MG tablet Take 1 tablet (20 mg total) by mouth 3 (three) times daily. 90 tablet 6  . warfarin (COUMADIN) 5 MG tablet Take 5 mg by mouth daily.      No current facility-administered medications for this visit.      ROS:  See HPI  Physical Exam:  Vitals:  11/09/17 1245  BP: 126/82  Pulse: 90  Resp: 20  Temp: 98.8 F (37.1 C)  SpO2: 96%    Incision:  Well healed.  JP site clean.  Minimal drainage < 5 cc. Extremities:  Sensation intact equal B, grip 5/5 with audible flow through graft to auscultation with stethoscope .   Assessment/Plan:  This is a 52 y.o. male who is s/p:Ligation of R 1st stage BRVT, RUA AVG with Artegraft  He will follow up at the 4 week post date for exam.  If the graft is running well we will allow them to start sticking the graft at that time.  He will continue daily aspirin and Coumadin.  He is s/p left ventricular assist device-heartmate 3.      Roxy Horseman PA-C [facility] [phone]  Clinic MD:  Oneida Alar

## 2017-11-10 ENCOUNTER — Encounter: Payer: BLUE CROSS/BLUE SHIELD | Admitting: Vascular Surgery

## 2017-11-10 ENCOUNTER — Ambulatory Visit (HOSPITAL_COMMUNITY): Payer: Self-pay | Admitting: Pharmacist

## 2017-11-10 DIAGNOSIS — Z5181 Encounter for therapeutic drug level monitoring: Secondary | ICD-10-CM

## 2017-11-10 DIAGNOSIS — I82499 Acute embolism and thrombosis of other specified deep vein of unspecified lower extremity: Secondary | ICD-10-CM

## 2017-11-14 ENCOUNTER — Other Ambulatory Visit (HOSPITAL_COMMUNITY): Payer: Self-pay | Admitting: Unknown Physician Specialty

## 2017-11-14 DIAGNOSIS — Z7901 Long term (current) use of anticoagulants: Secondary | ICD-10-CM

## 2017-11-14 DIAGNOSIS — Z95811 Presence of heart assist device: Secondary | ICD-10-CM

## 2017-11-14 NOTE — Progress Notes (Signed)
error 

## 2017-11-16 ENCOUNTER — Ambulatory Visit (HOSPITAL_COMMUNITY): Payer: Self-pay | Admitting: Pharmacist

## 2017-11-16 ENCOUNTER — Encounter (HOSPITAL_COMMUNITY): Payer: Self-pay

## 2017-11-16 ENCOUNTER — Ambulatory Visit (HOSPITAL_COMMUNITY)
Admit: 2017-11-16 | Discharge: 2017-11-16 | Disposition: A | Discharge status: Home / Self Care | Payer: BLUE CROSS/BLUE SHIELD | Attending: Internal Medicine | Admitting: Internal Medicine

## 2017-11-16 VITALS — BP 134/69 | HR 89 | Ht 68.0 in | Wt 176.6 lb

## 2017-11-16 DIAGNOSIS — I4892 Unspecified atrial flutter: Secondary | ICD-10-CM | POA: Insufficient documentation

## 2017-11-16 DIAGNOSIS — K5090 Crohn's disease, unspecified, without complications: Secondary | ICD-10-CM | POA: Diagnosis not present

## 2017-11-16 DIAGNOSIS — K219 Gastro-esophageal reflux disease without esophagitis: Secondary | ICD-10-CM | POA: Insufficient documentation

## 2017-11-16 DIAGNOSIS — N186 End stage renal disease: Secondary | ICD-10-CM | POA: Insufficient documentation

## 2017-11-16 DIAGNOSIS — I82499 Acute embolism and thrombosis of other specified deep vein of unspecified lower extremity: Secondary | ICD-10-CM

## 2017-11-16 DIAGNOSIS — Z7982 Long term (current) use of aspirin: Secondary | ICD-10-CM | POA: Insufficient documentation

## 2017-11-16 DIAGNOSIS — I214 Non-ST elevation (NSTEMI) myocardial infarction: Secondary | ICD-10-CM | POA: Diagnosis not present

## 2017-11-16 DIAGNOSIS — I255 Ischemic cardiomyopathy: Secondary | ICD-10-CM | POA: Diagnosis not present

## 2017-11-16 DIAGNOSIS — Z79899 Other long term (current) drug therapy: Secondary | ICD-10-CM | POA: Diagnosis not present

## 2017-11-16 DIAGNOSIS — D6851 Activated protein C resistance: Secondary | ICD-10-CM | POA: Insufficient documentation

## 2017-11-16 DIAGNOSIS — E1122 Type 2 diabetes mellitus with diabetic chronic kidney disease: Secondary | ICD-10-CM | POA: Diagnosis not present

## 2017-11-16 DIAGNOSIS — I5022 Chronic systolic (congestive) heart failure: Secondary | ICD-10-CM | POA: Diagnosis present

## 2017-11-16 DIAGNOSIS — I252 Old myocardial infarction: Secondary | ICD-10-CM | POA: Diagnosis not present

## 2017-11-16 DIAGNOSIS — Z8546 Personal history of malignant neoplasm of prostate: Secondary | ICD-10-CM | POA: Insufficient documentation

## 2017-11-16 DIAGNOSIS — Z794 Long term (current) use of insulin: Secondary | ICD-10-CM | POA: Insufficient documentation

## 2017-11-16 DIAGNOSIS — K3184 Gastroparesis: Secondary | ICD-10-CM | POA: Insufficient documentation

## 2017-11-16 DIAGNOSIS — I2510 Atherosclerotic heart disease of native coronary artery without angina pectoris: Secondary | ICD-10-CM | POA: Insufficient documentation

## 2017-11-16 DIAGNOSIS — I132 Hypertensive heart and chronic kidney disease with heart failure and with stage 5 chronic kidney disease, or end stage renal disease: Secondary | ICD-10-CM | POA: Diagnosis present

## 2017-11-16 DIAGNOSIS — E785 Hyperlipidemia, unspecified: Secondary | ICD-10-CM | POA: Diagnosis not present

## 2017-11-16 DIAGNOSIS — Z5181 Encounter for therapeutic drug level monitoring: Secondary | ICD-10-CM

## 2017-11-16 DIAGNOSIS — Z95811 Presence of heart assist device: Secondary | ICD-10-CM | POA: Insufficient documentation

## 2017-11-16 DIAGNOSIS — Z7901 Long term (current) use of anticoagulants: Secondary | ICD-10-CM | POA: Insufficient documentation

## 2017-11-16 LAB — PROTIME-INR
INR: 1.85
PROTHROMBIN TIME: 21.2 s — AB (ref 11.4–15.2)

## 2017-11-16 LAB — CBC
HEMATOCRIT: 36 % — AB (ref 39.0–52.0)
HEMOGLOBIN: 11.1 g/dL — AB (ref 13.0–17.0)
MCH: 27.6 pg (ref 26.0–34.0)
MCHC: 30.8 g/dL (ref 30.0–36.0)
MCV: 89.6 fL (ref 78.0–100.0)
Platelets: 211 10*3/uL (ref 150–400)
RBC: 4.02 MIL/uL — AB (ref 4.22–5.81)
RDW: 19.8 % — ABNORMAL HIGH (ref 11.5–15.5)
WBC: 6.8 10*3/uL (ref 4.0–10.5)

## 2017-11-16 LAB — BASIC METABOLIC PANEL
Anion gap: 13 (ref 5–15)
BUN: 24 mg/dL — AB (ref 6–20)
CHLORIDE: 101 mmol/L (ref 101–111)
CO2: 24 mmol/L (ref 22–32)
Calcium: 8.8 mg/dL — ABNORMAL LOW (ref 8.9–10.3)
Creatinine, Ser: 4.37 mg/dL — ABNORMAL HIGH (ref 0.61–1.24)
GFR calc non Af Amer: 14 mL/min — ABNORMAL LOW (ref >60)
GFR, EST AFRICAN AMERICAN: 16 mL/min — AB (ref >60)
Glucose, Bld: 181 mg/dL — ABNORMAL HIGH (ref 65–99)
POTASSIUM: 4.2 mmol/L (ref 3.5–5.1)
SODIUM: 138 mmol/L (ref 135–145)

## 2017-11-16 LAB — LACTATE DEHYDROGENASE: LDH: 263 U/L — ABNORMAL HIGH (ref 98–192)

## 2017-11-16 NOTE — Progress Notes (Addendum)
Patient presents for hospital  follow up in [facility] today. Reports no problems with VAD equipment or concerns with drive line.  Vital Signs:  Doppler Pressure 102  Automatc BP: 134/69 (96) HR:89  SPO2:99  %  Weight: 176.6 lb w/o eqt Last weight: 178 lb Home weights: 170-175 lbs   VAD Indication: Destination therapy- will fax information to [facility] for transplant eval process to begin    VAD interrogation & Equipment Management: Speed:5500 Flow: 4.0 Power:4.1 w    PI:5.5  Alarms: no external power on 3/2-pt states that his power blinked at home Events: rare-Pt states that Dr. Posey Pronto increased his dry weight.This has made a big difference in the amount of PI events.   Fixed speed 5500 Low speed limit: 5200  Primary Controller:  Replace back up battery in 63month. Back up controller:   Replace back up battery in 24 months.  Annual Equipment Maintenance on UBC/PM was performed on 06/2017.   I reviewed the LVAD parameters from today and compared the results to the patient's prior recorded data. LVAD interrogation was NEGATIVE for significant power changes, NEGATIVE for clinical alarms and STABLE for PI events/speed drops. No programming changes were made and pump is functioning within specified parameters. Pt is performing daily controller and system monitor self tests along with completing weekly and monthly maintenance for LVAD equipment.  LVAD equipment check completed and is in good working order. Back-up equipment present. Charged back up battery and performed self-test on equipment.   Exit Site Care: Drive line is being maintained weekly  by his daughter. Drive line exit site well healed and incorporated. The velour is fully implanted at exit site. Dressing dry and intact. No erythema or drainage. Stabilization device present and accurately applied. Pt denies fever or chills. Pt states they have adequate dressing supplies at home.    Significant Events on VAD  Support: -10/2016>> poss drive line infection, CT ABD neg, ID consult-doxy -11/09/16>> admit for poss drive line infection, IV abx -03/24/17>> doxy for poss drive line infection -05/04/17>> drive line debridement with wound-vac -06/2017>> drive line +proteus, IV abx, Bactrim x14 days  Device:St jude Dual Therapies: on. Monitor on at 150  BP & Labs:  MAP 102 - Doppler is reflecting modified systolic  Hgb 149.4- No S/S of bleeding. Specifically denies melena/BRBPR or nosebleeds.  LDH stable at 263 with established baseline of 245- 350. Denies tea-colored urine. No power elevations noted on interrogation.   Plan: 1. F/u with [facility] about transplant consult. Pt has not heard from them.  2. Return to clinic in 2 months.   STanda RockersRN VAD Coordinator   Office: [phone] Emergency VAD Pager: [phone]   Cardiology: Dr. MAundra Dubin HPI: 52y.o. with history of CAD s/p anterior MI in 2010 and NSTEMI in 3/18 with DES to pRCA and mLAD and ischemic cardiomyopathy now s/p Heartmate 3 LVAD and ESRD presents for followup of CHF/LVAD.  Patient developed low output heart failure.  He was not deemed to be a good transplant candidate.  He was admitted in 10/18 and Heartmate 3 LVAD was placed.  He had baseline CKD stage 3 likely from diabetic nephropathy and CHF.  He developed peri-op AKI and ended up on dialysis.  No renal recovery to date.  St Jude ICD has been turned back on and ramp echo was done prior to discharge in 10/18, speed increased to 5600 rpm.    He was admitted in 11/18 with nausea/vomiting and abdominal pain.  This appears to have been due to diabetic gastroparesis (extensive GI evaluation). He had atrial flutter this admission that was converted to NSR with amiodarone.  He improved considerably with scheduled Reglan and erythromycin. He was re-admitted later in 11/18 with the same symptoms, suspect diabetic gastroparesis and was again treated with erythromycin.  He was not able  to start domperidone due to prolonged QT interval. He had nausea again about a week ago but was able to manage at home with Phenergan suppositories.   Admitted 1/8 -> 09/16/17 for uncontrolled vomiting in setting of diabetic gastroparesis. Improved with phenergan suppositories and given erythromycin TID for 7 days post-discharge.  Referred for follow up with a gastroparesis specialist at [place] (Dr Derrill Kay).   He saw Dr Derrill Kay at [facility] and workup is ongoing.   He was admitted in 2/19 for AV graft placement.  While in the hospital, we turned down his speed.  Since then, he has had considerably fewer PI events on HD days.  MAP is elevated at 102 today but he has not taken his meds yet.  No abdominal pain currently.  No exertional dyspnea walking on flat ground.  No orthopnea/PND.  No problems at HD.   Reports taking Coumadin as prescribed and adherence to anticoagulation based dietary restrictions. Denies BRBPR or melena. No dark urine or hematuria.   Labs (11/18): hgb 10.4 => 8.7, LDH 213 Labs (08/04/17): hgb 9 Labs (1/19): HgbA1c 6.1 Labs (2/19): hgb 13.2 Labs (3/19): hgb 11.1  PMH: 1. Chronic systolic CHF: Ischemic cardiomyopathy. St Jude ICD.  - Echo (11/17) with EF 25-30%, moderate diastolic dysfunction, mild MR, mildly dilated RV with moderately decreased systolic function, peak RV-RA gradient 48 mmHg.  - Echo (3/18) with EF 25-30%, wall motion abnormalities, normal RV size and systolic function.  - Admission 3/18 with cardiogenic shock and AKI, required milrinone gtt.  - Echo (8/18) with EF 20-25%, mild LVH, moderate LV dilation, mild MR.  - CPX (8/18) with peak VO2 12.4, VE/VCO2 slope 35, RER 1.05 => severe HR limitation.  - Heartmate 3 LVAD 10/18.  2. CAD: Anterior MI 2005, had bifurcation stenting LAD/diagonal. Repeat cath 2010 with patent stents.  - NSTEMI 3/18: LHC with 99% ulcerated lesion proximal RCA, 95% mid LAD => PCI with DES to proximal RCA and mid LAD.  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  - Lower extremity venous dopplers (8/18): chronic DVT - V/Q scan (8/18): Normal, no PE.  4. Crohns disease: History of colectomy.  5. GERD 6. Diabetic gastroparesis: Admission 11/18 with abdominal pain, nausea/vomiting.  7. HTN 8. Hyperlipidemia 9. Type II diabetes  10. ESRD: Post-op LVAD surgery.  11. Carotid dopplers (8/18): mild bilateral stenosis.  12. PFTs (8/18): Mild restriction (no IDL on CT chest).  13. Lung nodules: CT chest 8/18 with small bilateral nodules.  14. PAD: ABIs with moderate bilateral disease in 8/18.  15. Atrial flutter: In 11/18 associated with nausea/vomiting from gastroparesis.   Current Outpatient Medications  Medication Sig Dispense Refill  . amLODipine (NORVASC) 5 MG tablet Take 1 tablet (5 mg total) by mouth daily. On non dialysis days. 30 tablet 3  . aspirin EC 81 MG tablet Take 1 tablet (81 mg total) by mouth daily. 90 tablet 3  . atorvastatin (LIPITOR) 40 MG tablet Take 1 tablet (40 mg total) daily at 6 PM by mouth. 30 tablet 6  . Continuous Blood Gluc Sensor (FREESTYLE LIBRE SENSOR SYSTEM) MISC Apply 1 reader to the skin every 10 days. 3 each  11  . dronabinol (MARINOL) 2.5 MG capsule Take 1 capsule (2.5 mg total) by mouth 2 (two) times daily before lunch and supper. 60 capsule 1  . glucose blood (FREESTYLE PRECISION NEO TEST) test strip Use as instructed 100 each 12  . hydrALAZINE (APRESOLINE) 25 MG tablet Take 1.5 tablets (37.5 mg total) by mouth 3 (three) times daily. Take on non-hd days 135 tablet 3  . insulin aspart (NOVOLOG) 100 UNIT/ML injection Inject 3 Units into the skin 3 (three) times daily with meals. (Patient taking differently: Inject 5 Units into the skin 3 (three) times daily with meals. ) 1 vial 0  . insulin glargine (LANTUS) 100 UNIT/ML injection Inject 0.05 mLs (5 Units total) into the skin daily. (Patient taking differently: Inject 10 Units into the skin daily. ) 10 mL 11  . LORazepam (ATIVAN) 0.5 MG tablet Take 1 tablet  (0.5 mg total) by mouth every 12 (twelve) hours as needed for anxiety or sleep. 30 tablet 0  . metoCLOPramide (REGLAN) 5 MG tablet Take 1 tablet (5 mg total) by mouth 4 (four) times daily -  before meals and at bedtime. (Patient taking differently: Take 10 mg by mouth 3 (three) times daily. ) 130 tablet 6  . Nutritional Supplements (FEEDING SUPPLEMENT, NEPRO CARB STEADY,) LIQD Take 237 mLs by mouth 3 (three) times daily with meals. (Patient taking differently: Take 237 mLs by mouth daily. ) 90 Can 0  . ondansetron (ZOFRAN ODT) 4 MG disintegrating tablet Take 1 tablet (4 mg total) by mouth every 6 (six) hours as needed for nausea or vomiting. 40 tablet 1  . pantoprazole (PROTONIX) 40 MG tablet Take 40 mg by mouth daily.    . promethazine (PHENERGAN) 12.5 MG tablet Take 1 tablet (12.5 mg total) by mouth every 6 (six) hours as needed for nausea or vomiting. (Patient taking differently: Take 25 mg by mouth every 6 (six) hours as needed for nausea or vomiting. ) 30 tablet 0  . sildenafil (REVATIO) 20 MG tablet Take 1 tablet (20 mg total) by mouth 3 (three) times daily. 90 tablet 6  . warfarin (COUMADIN) 5 MG tablet Take 5 mg by mouth daily.     [name] docusate sodium (COLACE) 100 MG capsule Take 2 capsules (200 mg total) by mouth daily. (Patient not taking: Reported on 11/16/2017) 10 capsule 0   No current facility-administered medications for this encounter.     Metformin and related   Review of systems complete and found to be negative unless listed in HPI.    BP 134/69 Comment: map-96  Pulse 89   Ht 5' 8"  (1.727 m)   Wt 176 lb 9.6 oz (80.1 kg)   SpO2 99%   BMI 26.85 kg/m  MAP 102  VAD interrogation & Equipment Management: See nurse's note above.   I reviewed the LVAD parameters from today, and compared the results to the patient's prior recorded data.  No programming changes were made.  The LVAD is functioning within specified parameters.  The patient performs LVAD self-test daily.  LVAD  interrogation was negative for any significant power changes, alarms or PI events/speed drops.  LVAD equipment check completed and is in good working order.  Back-up equipment present.   LVAD education done on emergency procedures and precautions and reviewed exit site care.   Physical Exam: GENERAL: Well appearing this am. NAD.  HEENT: Normal. NECK: Supple, JVP 7-8 cm. Carotids OK.  CARDIAC:  Mechanical heart sounds with LVAD hum present.  LUNGS:  CTAB,  normal effort.  ABDOMEN:  NT, ND, no HSM. No bruits or masses. +BS  LVAD exit site: Well-healed and incorporated. Dressing dry and intact. No erythema or drainage. Stabilization device present and accurately applied. Driveline dressing changed daily per sterile technique. EXTREMITIES:  Warm and dry. No cyanosis, clubbing, rash, or edema.  NEUROLOGIC:  Alert & oriented x 3. Cranial nerves grossly intact. Moves all 4 extremities w/o difficulty. Affect pleasant    ASSESSMENT AND PLAN: 1. Chronic systolic CHF: Ischemic cardiomyopathy.  St Jude ICD.  NSTEMI in March 2018 with DES to LAD and RCA, complicated by cardiogenic shock, low output requiring milrinone.  Echo 8/18 with EF 20-25%, moderate MR.  PX (8/18) with severe functional impairment due to HF.  He is now s/p HM3 LVAD placement. LVAD parameters reviewed today and stable, fewer PI events at HD now with lower speed.  No complaints. MAP 103 but has not taken BP-active meds today yet.  Hemoglobin stable. NYHA II. Volume status stable on exam.  - Continue amlodipine 5 mg daily.  - Continue ASA 81.  Continue warfarin INR 2-3 for now with no overt GI bleeding.  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  - Continue hydralazine 37.5 mg tid on non-HD days.  - He has been referred to [facility] for evaluation for heart/kidney transplant.  2. ESRD: Developed peri-operatively after HM3 placement.  He is tolerating HD.   3. CAD: NSTEMI in 3/18. LHC with 99% ulcerated lesion proximal RCA with left to  right collaterals, 95% mid LAD stenosis after mid LAD stent. s/p PCI to RCA and LAD on 11/25/16.   - Continue statin and ASA. 4. h/o DVTs: Factor V Leiden heterozygote. - On coumadin. 5. Diabetic gastroparesis: Difficult to manage. Continue Phenergan suppositories if nausea/vomiting returns. He has seen gastroparesis specialist at [facility] and workup is continuing. . - Continue reglan 5 mg tid/ac 6. DM: HgbA1c is now well-controlled.  7. HTN: MAP high but has not taken meds today.   8. PAD: Moderate bilateral disease on ABIs in 8/18.  Denies claudication currently, no pedal ulcers.  9. Atrial flutter: Paroxysmal.  He has been in NSR recently.   Followup in 2 months.   Loralie Champagne, MD  11/16/2017

## 2017-11-24 ENCOUNTER — Encounter: Payer: BLUE CROSS/BLUE SHIELD | Admitting: Vascular Surgery

## 2017-11-27 LAB — PROTIME-INR: INR: 1.9 — AB (ref <=1.1)

## 2017-11-28 ENCOUNTER — Encounter: Payer: Self-pay | Admitting: *Deleted

## 2017-11-28 ENCOUNTER — Ambulatory Visit (HOSPITAL_COMMUNITY): Payer: Self-pay | Admitting: Pharmacist

## 2017-11-28 ENCOUNTER — Encounter: Payer: Self-pay | Admitting: Vascular Surgery

## 2017-11-28 ENCOUNTER — Other Ambulatory Visit: Payer: Self-pay

## 2017-11-28 ENCOUNTER — Ambulatory Visit (INDEPENDENT_AMBULATORY_CARE_PROVIDER_SITE_OTHER): Payer: Self-pay | Admitting: Vascular Surgery

## 2017-11-28 VITALS — BP 124/70 | HR 85 | Temp 98.5°F | Resp 20 | Ht 68.0 in | Wt 179.0 lb

## 2017-11-28 DIAGNOSIS — Z992 Dependence on renal dialysis: Secondary | ICD-10-CM

## 2017-11-28 DIAGNOSIS — Z5181 Encounter for therapeutic drug level monitoring: Secondary | ICD-10-CM

## 2017-11-28 DIAGNOSIS — N186 End stage renal disease: Secondary | ICD-10-CM

## 2017-11-28 LAB — POCT INR: INR: 1.6

## 2017-11-28 NOTE — Progress Notes (Signed)
HISTORY AND PHYSICAL     CC:  Follow up RUA AV graft Requesting Provider:  Larey Seat*  HPI: This is a 52 y.o. male with LVAD who presents today for follow up after placement of right upper arm AV graft with artegraft and ligation of right 1st stage brachial vein transposition on 11/28/17 by Dr. Bridgett Larsson.  He is currently dialyzing M/W/F at the [facility] on [facility] via a right IJ [facility] placed in October 2018 by Dr. Bridgett Larsson.  He presents today for follow up.  He is not having any pain in his hand.    Past Medical History:  Diagnosis Date  . Angina   . ASCVD (arteriosclerotic cardiovascular disease)    , Anterior infarction 2005, LAD diagonal bifurcation intervention 03/2004  . Automatic implantable cardiac defibrillator -St. Jude's       . Benign neoplasm of colon   . CHF (congestive heart failure) ([place])   . Chronic systolic heart failure ([place])   . Coronary artery disease     Widely patent previously placed stents in the left anterior   . Crohn's disease ([place])   . 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 Dialysis patient ([place])   . Dyspnea   . Gastroparesis   . GERD (gastroesophageal reflux disease)   . High cholesterol   . Hyperlipidemia   . Hypersomnolent    Previous diagnosis of narcolepsy  . Hypertension, essential   . Ischemic cardiomyopathy    Ejection fraction 15-20% catheterization 2010  . Type II or unspecified type diabetes mellitus without mention of complication, not stated as uncontrolled   . Unspecified gastritis and gastroduodenitis without mention of hemorrhage     Past Surgical History:  Procedure Laterality Date  . APPENDECTOMY    . AV FISTULA PLACEMENT Right 06/26/2017   Procedure: ARTERIOVENOUS (AV) FISTULA CREATION VERSUS GRAFT RIGHT  ARM;  Surgeon: Conrad Arnold, MD;  Location: [facility] OR;  Service: Vascular;  Laterality: Right;  . AV FISTULA PLACEMENT Right 10/31/2017   Procedure: INSERTION OF ARTERIOVENOUS (AV) ARTEGRAFT,  RIGHT  UPPER ARM;  Surgeon: Conrad [place]ley[place], MD;  Location: [place];  Service: Vascular;  Laterality: Right;  . CARDIAC DEFIBRILLATOR PLACEMENT  2010   St. Jude ICD  . COLECTOMY  ~ 2003   "for Crohn's" ascending colon.    . CORONARY STENT INTERVENTION N/A 11/25/2016   Procedure: Coronary Stent Intervention;  Surgeon: Leonie Man, MD;  Location: [place] CV LAB;  Service: Cardiovascular;  Laterality: N/A;  . EP IMPLANTABLE DEVICE N/A 09/14/2016   Procedure: ICD Generator Changeout;  Surgeon: Deboraha Sprang, MD;  Location: [place] CV LAB;  Service: Cardiovascular;  Laterality: N/A;  . ESOPHAGOGASTRODUODENOSCOPY N/A 07/12/2017   Procedure: ESOPHAGOGASTRODUODENOSCOPY (EGD);  Surgeon: Jerene Bears, MD;  Location: [facility] ENDOSCOPY;  Service: Gastroenterology;  Laterality: N/A;  VAD pt so VAD team will need to accompany pt.   [name] FETAL SURGERY FOR CONGENITAL HERNIA     ????? pt has no knowledge of this..    . INGUINAL HERNIA REPAIR    . INSERTION OF DIALYSIS CATHETER Right 06/26/2017   Procedure: INSERTION OF TUNNELED  DIALYSIS CATHETER RIGHT INTERNAL JUGULAR;  Surgeon: Conrad [place], MD;  Location: [place];  Service: Vascular;  Laterality: Right;  . INSERTION OF IMPLANTABLE LEFT VENTRICULAR ASSIST DEVICE N/A 06/12/2017   Procedure: INSERTION OF IMPLANTABLE LEFT VENTRICULAR ASSIST [place] 3;  Surgeon: Ivin Poot, MD;  Location: [place];  Service: Open  Heart Surgery;  Laterality: N/A;  HM3 LVAD  CIRC ARREST  NITRIC OXIDE  . IR THORACENTESIS ASP PLEURAL SPACE W/IMG GUIDE  06/28/2017  . LIGATION OF ARTERIOVENOUS  FISTULA Right 10/31/2017   Procedure: LIGATION OF BRACHIO-BASILIC VEIN TRANSPOSITION RIGHT ARM;  Surgeon: Conrad [place], MD;  Location: [place];  Service: Vascular;  Laterality: Right;  . MULTIPLE EXTRACTIONS WITH ALVEOLOPLASTY N/A 06/08/2017   Procedure: MULTIPLE EXTRACTION WITH ALVEOLOPLASTY AND PRE PROSTHETIC SURGERY AS NEEDED;  Surgeon: Lenn Cal, DDS;  Location: [place];  Service:  Oral Surgery;  Laterality: N/A;  . RIGHT HEART CATH N/A 06/07/2017   Procedure: RIGHT HEART CATH;  Surgeon: Larey Dresser, MD;  Location: [place] CV LAB;  Service: Cardiovascular;  Laterality: N/A;  . RIGHT/LEFT HEART CATH AND CORONARY ANGIOGRAPHY N/A 11/23/2016   Procedure: Right/Left Heart Cath and Coronary Angiography;  Surgeon: Larey Dresser, MD;  Location: [place] CV LAB;  Service: Cardiovascular;  Laterality: N/A;  . TEE WITHOUT CARDIOVERSION N/A 06/12/2017   Procedure: TRANSESOPHAGEAL ECHOCARDIOGRAM (TEE);  Surgeon: Prescott Gum, Collier Salina, MD;  Location: [place];  Service: Open Heart Surgery;  Laterality: N/A;    Allergies  Allergen Reactions  . Metformin And Related Diarrhea    Current Outpatient Medications  Medication Sig Dispense Refill  . amLODipine (NORVASC) 5 MG tablet Take 1 tablet (5 mg total) by mouth daily. On non dialysis days. 30 tablet 3  . aspirin EC 81 MG tablet Take 1 tablet (81 mg total) by mouth daily. 90 tablet 3  . atorvastatin (LIPITOR) 40 MG tablet Take 1 tablet (40 mg total) daily at 6 PM by mouth. 30 tablet 6  . Continuous Blood Gluc Sensor (FREESTYLE LIBRE SENSOR SYSTEM) MISC Apply 1 reader to the skin every 10 days. 3 each 11  . docusate sodium (COLACE) 100 MG capsule Take 2 capsules (200 mg total) by mouth daily. 10 capsule 0  . dronabinol (MARINOL) 2.5 MG capsule Take 1 capsule (2.5 mg total) by mouth 2 (two) times daily before lunch and supper. 60 capsule 1  . glucose blood (FREESTYLE PRECISION NEO TEST) test strip Use as instructed 100 each 12  . hydrALAZINE (APRESOLINE) 25 MG tablet Take 1.5 tablets (37.5 mg total) by mouth 3 (three) times daily. Take on non-hd days 135 tablet 3  . insulin aspart (NOVOLOG) 100 UNIT/ML injection Inject 3 Units into the skin 3 (three) times daily with meals. (Patient taking differently: Inject 5 Units into the skin 3 (three) times daily with meals. ) 1 vial 0  . insulin glargine (LANTUS) 100 UNIT/ML injection Inject  0.05 mLs (5 Units total) into the skin daily. (Patient taking differently: Inject 10 Units into the skin daily. ) 10 mL 11  . LORazepam (ATIVAN) 0.5 MG tablet Take 1 tablet (0.5 mg total) by mouth every 12 (twelve) hours as needed for anxiety or sleep. 30 tablet 0  . metoCLOPramide (REGLAN) 5 MG tablet Take 1 tablet (5 mg total) by mouth 4 (four) times daily -  before meals and at bedtime. (Patient taking differently: Take 10 mg by mouth 3 (three) times daily. ) 130 tablet 6  . Nutritional Supplements (FEEDING SUPPLEMENT, NEPRO CARB STEADY,) LIQD Take 237 mLs by mouth 3 (three) times daily with meals. (Patient taking differently: Take 237 mLs by mouth daily. ) 90 Can 0  . ondansetron (ZOFRAN ODT) 4 MG disintegrating tablet Take 1 tablet (4 mg total) by mouth every 6 (six) hours as needed for nausea or vomiting.  40 tablet 1  . pantoprazole (PROTONIX) 40 MG tablet Take 40 mg by mouth daily.    . promethazine (PHENERGAN) 12.5 MG tablet Take 1 tablet (12.5 mg total) by mouth every 6 (six) hours as needed for nausea or vomiting. (Patient taking differently: Take 25 mg by mouth every 6 (six) hours as needed for nausea or vomiting. ) 30 tablet 0  . sildenafil (REVATIO) 20 MG tablet Take 1 tablet (20 mg total) by mouth 3 (three) times daily. 90 tablet 6  . warfarin (COUMADIN) 5 MG tablet Take 5 mg by mouth daily.      No current facility-administered medications for this visit.     Family History  Problem Relation Age of Onset  . Colon polyps Mother   . Diabetes Mother   . Heart attack Father   . Diabetes Father   . Stroke Paternal Grandfather   . Colitis Sister   . Heart disease Brother   . Colitis Sister   . Colon cancer Neg Hx     Social History   Socioeconomic History  . Marital status: Married    Spouse name: Not on file  . Number of children: Not on file  . Years of education: Not on file  . Highest education level: Not on file  Occupational History  . Not on file  Social Needs  .  Financial resource strain: Not on file  . Food insecurity:    Worry: Not on file    Inability: Not on file  . Transportation needs:    Medical: Not on file    Non-medical: Not on file  Tobacco Use  . Smoking status: Never Smoker  . Smokeless tobacco: Never Used  Substance and Sexual Activity  . Alcohol use: No  . Drug use: No  . Sexual activity: Never  Lifestyle  . Physical activity:    Days per week: Not on file    Minutes per session: Not on file  . Stress: Not on file  Relationships  . Social connections:    Talks on phone: Not on file    Gets together: Not on file    Attends religious service: Not on file    Active member of club or organization: Not on file    Attends meetings of clubs or organizations: Not on file    Relationship status: Not on file  . Intimate partner violence:    Fear of current or ex partner: Not on file    Emotionally abused: Not on file    Physically abused: Not on file    Forced sexual activity: Not on file  Other Topics Concern  . Not on file  Social History Narrative  . Not on file     REVIEW OF SYSTEMS:  See HPI   PHYSICAL EXAMINATION:  Vitals:   11/28/17 1459  BP: 124/70  Pulse: 85  Resp: 20  Temp: 98.5 F (36.9 C)  SpO2: 97%   Vitals:   11/28/17 1459  Weight: 179 lb (81.2 kg)  Height: 5' 8"  (1.727 m)   Body mass index is 27.22 kg/m.  General:  WDWN in NAD; vital signs documented above Gait: Normal HENT: WNL, normocephalic Pulmonary: normal non-labored breathing Cardiac: regular HR Skin: well healed incision Vascular Exam/Pulses: Unable to palpate graft or hear flow with doppler Brisk right radial doppler signal Extremities: motor and sensation are in tact right hand Musculoskeletal: no muscle wasting or atrophy  Neurologic: A&O X 3;  No focal weakness or paresthesias are  detected Psychiatric:  The pt has Normal affect.   Non-Invasive Vascular Imaging:   none    ASSESSMENT/PLAN:: 52 y.o. male who is s/p  RUA AVG and ligation of right 1st stage brachial vein transposition who presents today for follow up.   -unable to palpate graft or hear bruit and also unable to hear with doppler. -pt also examined by Dr. Donnetta Hutching and concluded that the graft is occluded.   -he is currently dialyzing via a right IJ [facility] that was placed in October 2018 by Dr. Bridgett Larsson.   -pt has LVAD and is on coumadin.  pt will need to be admitted by cardiology and coumadin bridged with heparin.  This is scheduled for 12/19/17 by Dr. Bridgett Larsson.   -explained to pt that having the catheter places him at risk for infection, which is why we need to get something working for access.     Leontine Locket, PA-C [facility] [phone]  Clinic MD:  Pt seen and examined with Dr. Donnetta Hutching

## 2017-11-29 ENCOUNTER — Telehealth: Payer: Self-pay | Admitting: *Deleted

## 2017-11-29 ENCOUNTER — Other Ambulatory Visit: Payer: Self-pay | Admitting: *Deleted

## 2017-11-29 NOTE — Telephone Encounter (Signed)
Patient called and informed of surgery date changed to April 9,2019. Aware that he will be admitted to hospital with Dr. Aundra Dubin for Anticoag management pre-op.

## 2017-11-29 NOTE — Telephone Encounter (Signed)
-----   Message from Willy Eddy, RN sent at 11/29/2017 11:15 AM EDT ----- Regarding: FW: Anticoag management    ----- Message ----- From: Willy Eddy, RN Sent: 11/29/2017  11:08 AM To: Larey Dresser, MD, Adora Fridge, RPH-CPP Subject: Anticoag management                            Requesting Anticoag. Management for this patient> Scheduled for Thrombectomy/revision of right arm A/V graft. Possible new graft to RUE. Surgery for 12/12/17 with Dr. Bridgett Larsson. Will need to hold Coumadin for 5 days pre-op. I know you all will need to admit him to hospital prior to surgery for Heparin. He will be expecting your call about this.  I greatly appreciate your help with this patient. Thank you, Archivist

## 2017-12-05 ENCOUNTER — Ambulatory Visit (HOSPITAL_COMMUNITY): Payer: Self-pay | Admitting: Pharmacist

## 2017-12-05 ENCOUNTER — Telehealth: Payer: Self-pay | Admitting: *Deleted

## 2017-12-05 DIAGNOSIS — Z5181 Encounter for therapeutic drug level monitoring: Secondary | ICD-10-CM

## 2017-12-05 DIAGNOSIS — I824Y9 Acute embolism and thrombosis of unspecified deep veins of unspecified proximal lower extremity: Secondary | ICD-10-CM

## 2017-12-05 DIAGNOSIS — Z95811 Presence of heart assist device: Secondary | ICD-10-CM

## 2017-12-05 LAB — POCT INR: INR: 2.8

## 2017-12-05 NOTE — Telephone Encounter (Signed)
-----   Message from Adora Fridge, [place] sent at 12/05/2017 10:43 AM EDT ----- Regarding: FW: [name] Topor   ----- Message ----- From: Christinia Gully, RN Sent: 12/05/2017  10:38 AM To: Adora Fridge, RPH-CPP, Lezlie Octave, RN Subject: RE: [name]                           HI  We will admit on Monday 4/8 to bridge with heparin. He will require dialysis on Monday as well as a central line as he has no IV access. This is of course assuming that his INR will be high on Friday 4/5 to hold coumadin through the weekend to be admitted. Will this be done in the OR under anesthesia? VAD coord will have to be present. If this is the case can you send OR time so that we can plan accordingly. Thanks  ----- Message ----- From: Adora Fridge, RPH-CPP Sent: 12/05/2017  10:12 AM To: Christinia Gully, RN, Lezlie Octave, RN Subject: [name]                             ----- Message ----- From: Willy Eddy, RN Sent: 12/04/2017  12:03 PM To: Larey Dresser, MD, Adora Fridge, RPH-CPP Subject: [name]                               Follow up. Can you send me a message for plans for this patient for admission prior to procedure. For Anticoagulation management. Thank you, Archivist

## 2017-12-08 ENCOUNTER — Encounter (HOSPITAL_COMMUNITY): Payer: Self-pay | Admitting: Unknown Physician Specialty

## 2017-12-08 ENCOUNTER — Ambulatory Visit (HOSPITAL_COMMUNITY): Payer: Self-pay | Admitting: Pharmacist

## 2017-12-08 DIAGNOSIS — Z95811 Presence of heart assist device: Secondary | ICD-10-CM

## 2017-12-08 DIAGNOSIS — Z5181 Encounter for therapeutic drug level monitoring: Secondary | ICD-10-CM

## 2017-12-08 DIAGNOSIS — I824Y9 Acute embolism and thrombosis of unspecified deep veins of unspecified proximal lower extremity: Secondary | ICD-10-CM

## 2017-12-08 LAB — POCT INR: INR: 2.1

## 2017-12-08 NOTE — Progress Notes (Signed)
Admission called in for Monday 4/8, spoke w/Robin. Pt is requiring Heparin bridge for thrombectomy scheduled for Tuesday 4/9. Pt will require a central line as he has no IV access and dialysis. Patient and Providers aware of this admission. VAD coordinator will accompany pt to the OR.  [name] RN, BSN VAD Coordinator 24/7 Pager [phone]

## 2017-12-11 ENCOUNTER — Encounter (HOSPITAL_COMMUNITY): Payer: Self-pay | Admitting: Radiology

## 2017-12-11 ENCOUNTER — Ambulatory Visit (HOSPITAL_COMMUNITY): Payer: BLUE CROSS/BLUE SHIELD | Attending: Vascular Surgery

## 2017-12-11 ENCOUNTER — Inpatient Hospital Stay (HOSPITAL_COMMUNITY): Payer: BLUE CROSS/BLUE SHIELD

## 2017-12-11 ENCOUNTER — Inpatient Hospital Stay (HOSPITAL_COMMUNITY)
Admission: RE | Admit: 2017-12-11 | Discharge: 2017-12-18 | DRG: 252 | Disposition: A | Discharge status: Home / Self Care | Payer: BLUE CROSS/BLUE SHIELD | Source: Ambulatory Visit | Attending: Cardiology | Admitting: Cardiology

## 2017-12-11 DIAGNOSIS — Z794 Long term (current) use of insulin: Secondary | ICD-10-CM

## 2017-12-11 DIAGNOSIS — G47419 Narcolepsy without cataplexy: Secondary | ICD-10-CM | POA: Diagnosis present

## 2017-12-11 DIAGNOSIS — D6851 Activated protein C resistance: Secondary | ICD-10-CM | POA: Diagnosis present

## 2017-12-11 DIAGNOSIS — N186 End stage renal disease: Secondary | ICD-10-CM | POA: Diagnosis not present

## 2017-12-11 DIAGNOSIS — K5090 Crohn's disease, unspecified, without complications: Secondary | ICD-10-CM | POA: Diagnosis present

## 2017-12-11 DIAGNOSIS — E785 Hyperlipidemia, unspecified: Secondary | ICD-10-CM | POA: Diagnosis present

## 2017-12-11 DIAGNOSIS — Y832 Surgical operation with anastomosis, bypass or graft as the cause of abnormal reaction of the patient, or of later complication, without mention of misadventure at the time of the procedure: Secondary | ICD-10-CM | POA: Diagnosis present

## 2017-12-11 DIAGNOSIS — T82868A Thrombosis of vascular prosthetic devices, implants and grafts, initial encounter: Secondary | ICD-10-CM | POA: Diagnosis present

## 2017-12-11 DIAGNOSIS — Z7901 Long term (current) use of anticoagulants: Secondary | ICD-10-CM

## 2017-12-11 DIAGNOSIS — I739 Peripheral vascular disease, unspecified: Secondary | ICD-10-CM | POA: Diagnosis present

## 2017-12-11 DIAGNOSIS — E1122 Type 2 diabetes mellitus with diabetic chronic kidney disease: Secondary | ICD-10-CM | POA: Diagnosis present

## 2017-12-11 DIAGNOSIS — K3184 Gastroparesis: Secondary | ICD-10-CM | POA: Diagnosis present

## 2017-12-11 DIAGNOSIS — E1151 Type 2 diabetes mellitus with diabetic peripheral angiopathy without gangrene: Secondary | ICD-10-CM | POA: Diagnosis present

## 2017-12-11 DIAGNOSIS — K219 Gastro-esophageal reflux disease without esophagitis: Secondary | ICD-10-CM | POA: Diagnosis present

## 2017-12-11 DIAGNOSIS — Z7982 Long term (current) use of aspirin: Secondary | ICD-10-CM

## 2017-12-11 DIAGNOSIS — Z992 Dependence on renal dialysis: Secondary | ICD-10-CM | POA: Diagnosis not present

## 2017-12-11 DIAGNOSIS — E1121 Type 2 diabetes mellitus with diabetic nephropathy: Secondary | ICD-10-CM | POA: Diagnosis present

## 2017-12-11 DIAGNOSIS — I2510 Atherosclerotic heart disease of native coronary artery without angina pectoris: Secondary | ICD-10-CM | POA: Diagnosis present

## 2017-12-11 DIAGNOSIS — I132 Hypertensive heart and chronic kidney disease with heart failure and with stage 5 chronic kidney disease, or end stage renal disease: Secondary | ICD-10-CM | POA: Diagnosis present

## 2017-12-11 DIAGNOSIS — I255 Ischemic cardiomyopathy: Secondary | ICD-10-CM | POA: Diagnosis present

## 2017-12-11 DIAGNOSIS — I5022 Chronic systolic (congestive) heart failure: Secondary | ICD-10-CM | POA: Diagnosis present

## 2017-12-11 DIAGNOSIS — I252 Old myocardial infarction: Secondary | ICD-10-CM

## 2017-12-11 DIAGNOSIS — N2581 Secondary hyperparathyroidism of renal origin: Secondary | ICD-10-CM | POA: Diagnosis present

## 2017-12-11 DIAGNOSIS — Z955 Presence of coronary angioplasty implant and graft: Secondary | ICD-10-CM

## 2017-12-11 DIAGNOSIS — Z95828 Presence of other vascular implants and grafts: Secondary | ICD-10-CM

## 2017-12-11 DIAGNOSIS — N179 Acute kidney failure, unspecified: Secondary | ICD-10-CM | POA: Diagnosis not present

## 2017-12-11 DIAGNOSIS — I871 Compression of vein: Secondary | ICD-10-CM | POA: Diagnosis present

## 2017-12-11 DIAGNOSIS — Z95811 Presence of heart assist device: Secondary | ICD-10-CM

## 2017-12-11 DIAGNOSIS — Z888 Allergy status to other drugs, medicaments and biological substances status: Secondary | ICD-10-CM

## 2017-12-11 DIAGNOSIS — Z86718 Personal history of other venous thrombosis and embolism: Secondary | ICD-10-CM

## 2017-12-11 DIAGNOSIS — Z95810 Presence of automatic (implantable) cardiac defibrillator: Secondary | ICD-10-CM

## 2017-12-11 DIAGNOSIS — E1143 Type 2 diabetes mellitus with diabetic autonomic (poly)neuropathy: Secondary | ICD-10-CM | POA: Diagnosis present

## 2017-12-11 DIAGNOSIS — I4892 Unspecified atrial flutter: Secondary | ICD-10-CM | POA: Diagnosis present

## 2017-12-11 DIAGNOSIS — Z79899 Other long term (current) drug therapy: Secondary | ICD-10-CM

## 2017-12-11 DIAGNOSIS — I509 Heart failure, unspecified: Secondary | ICD-10-CM

## 2017-12-11 DIAGNOSIS — D631 Anemia in chronic kidney disease: Secondary | ICD-10-CM | POA: Diagnosis present

## 2017-12-11 DIAGNOSIS — I8291 Chronic embolism and thrombosis of unspecified vein: Secondary | ICD-10-CM | POA: Diagnosis present

## 2017-12-11 LAB — RENAL FUNCTION PANEL
ALBUMIN: 3 g/dL — AB (ref 3.5–5.0)
Anion gap: 10 (ref 5–15)
BUN: 64 mg/dL — AB (ref 6–20)
CALCIUM: 8.2 mg/dL — AB (ref 8.9–10.3)
CO2: 20 mmol/L — ABNORMAL LOW (ref 22–32)
CREATININE: 8.85 mg/dL — AB (ref 0.61–1.24)
Chloride: 104 mmol/L (ref 101–111)
GFR calc Af Amer: 7 mL/min — ABNORMAL LOW (ref >60)
GFR calc non Af Amer: 6 mL/min — ABNORMAL LOW (ref >60)
Glucose, Bld: 221 mg/dL — ABNORMAL HIGH (ref 65–99)
PHOSPHORUS: 5.2 mg/dL — AB (ref 2.5–4.6)
POTASSIUM: 4.2 mmol/L (ref 3.5–5.1)
SODIUM: 134 mmol/L — AB (ref 135–145)

## 2017-12-11 LAB — CBC
HCT: 34.1 % — ABNORMAL LOW (ref 39.0–52.0)
Hemoglobin: 10.6 g/dL — ABNORMAL LOW (ref 13.0–17.0)
MCH: 27.6 pg (ref 26.0–34.0)
MCHC: 31.1 g/dL (ref 30.0–36.0)
MCV: 88.8 fL (ref 78.0–100.0)
PLATELETS: 211 10*3/uL (ref 150–400)
RBC: 3.84 MIL/uL — ABNORMAL LOW (ref 4.22–5.81)
RDW: 17 % — AB (ref 11.5–15.5)
WBC: 8.1 10*3/uL (ref 4.0–10.5)

## 2017-12-11 LAB — GLUCOSE, CAPILLARY
GLUCOSE-CAPILLARY: 144 mg/dL — AB (ref 65–99)
GLUCOSE-CAPILLARY: 74 mg/dL (ref 65–99)
Glucose-Capillary: 153 mg/dL — ABNORMAL HIGH (ref 65–99)

## 2017-12-11 LAB — HEPARIN LEVEL (UNFRACTIONATED): Heparin Unfractionated: 1.01 IU/mL — ABNORMAL HIGH (ref 0.30–0.70)

## 2017-12-11 LAB — LACTATE DEHYDROGENASE: LDH: 188 U/L (ref 98–192)

## 2017-12-11 LAB — MRSA PCR SCREENING: MRSA by PCR: NEGATIVE

## 2017-12-11 LAB — PROTIME-INR
INR: 1.24
Prothrombin Time: 15.5 seconds — ABNORMAL HIGH (ref 11.4–15.2)

## 2017-12-11 MED ORDER — SILDENAFIL CITRATE 20 MG PO TABS
20.0000 mg | ORAL_TABLET | Freq: Three times a day (TID) | ORAL | Status: DC
  Administered 2017-12-11 – 2017-12-18 (×21): 20 mg via ORAL
  Filled 2017-12-11 (×21): qty 1

## 2017-12-11 MED ORDER — ONDANSETRON 4 MG PO TBDP: 4.0000 mg | ORAL_TABLET | Freq: Four times a day (QID) | ORAL | Status: DC | PRN

## 2017-12-11 MED ORDER — AMLODIPINE BESYLATE 5 MG PO TABS: 5.0000 mg | ORAL_TABLET | ORAL | Status: DC

## 2017-12-11 MED ORDER — SODIUM CHLORIDE 0.9 % IV SOLN: 100.0000 mL | INTRAVENOUS | Status: DC | PRN

## 2017-12-11 MED ORDER — CHLORHEXIDINE GLUCONATE 4 % EX LIQD
60.0000 mL | Freq: Once | CUTANEOUS | Status: AC
  Administered 2017-12-12: 4 via TOPICAL

## 2017-12-11 MED ORDER — NEPRO/CARBSTEADY PO LIQD
237.0000 mL | Freq: Every day | ORAL | Status: DC
  Administered 2017-12-13 – 2017-12-17 (×4): 237 mL via ORAL
  Filled 2017-12-11 (×9): qty 237

## 2017-12-11 MED ORDER — MUPIROCIN 2 % EX OINT
1.0000 "application " | TOPICAL_OINTMENT | Freq: Two times a day (BID) | CUTANEOUS | Status: AC
  Administered 2017-12-12 – 2017-12-13 (×5): 1 via NASAL
  Filled 2017-12-11: qty 22

## 2017-12-11 MED ORDER — ALBUMIN HUMAN 25 % IV SOLN
INTRAVENOUS | Status: AC
  Administered 2017-12-11: 25 g via INTRAVENOUS
  Filled 2017-12-11: qty 100

## 2017-12-11 MED ORDER — ALTEPLASE 2 MG IJ SOLR: 2.0000 mg | Freq: Once | INTRAMUSCULAR | Status: DC | PRN

## 2017-12-11 MED ORDER — ASPIRIN EC 81 MG PO TBEC
81.0000 mg | DELAYED_RELEASE_TABLET | Freq: Every day | ORAL | Status: DC
  Administered 2017-12-11 – 2017-12-18 (×8): 81 mg via ORAL
  Filled 2017-12-11 (×8): qty 1

## 2017-12-11 MED ORDER — MIDODRINE HCL 5 MG PO TABS
10.0000 mg | ORAL_TABLET | Freq: Once | ORAL | Status: AC
  Administered 2017-12-11: 10 mg via ORAL

## 2017-12-11 MED ORDER — HYDRALAZINE HCL 25 MG PO TABS
37.5000 mg | ORAL_TABLET | ORAL | Status: DC
  Administered 2017-12-12 – 2017-12-17 (×11): 37.5 mg via ORAL
  Filled 2017-12-11 (×14): qty 1.5

## 2017-12-11 MED ORDER — INSULIN ASPART 100 UNIT/ML ~~LOC~~ SOLN
3.0000 [IU] | Freq: Three times a day (TID) | SUBCUTANEOUS | Status: DC
  Administered 2017-12-13 – 2017-12-17 (×10): 3 [IU] via SUBCUTANEOUS

## 2017-12-11 MED ORDER — PROMETHAZINE HCL 25 MG PO TABS: 25.0000 mg | ORAL_TABLET | Freq: Four times a day (QID) | ORAL | Status: DC | PRN

## 2017-12-11 MED ORDER — INSULIN ASPART 100 UNIT/ML ~~LOC~~ SOLN
0.0000 [IU] | Freq: Three times a day (TID) | SUBCUTANEOUS | Status: DC
  Administered 2017-12-12: 2 [IU] via SUBCUTANEOUS
  Administered 2017-12-12 – 2017-12-13 (×2): 1 [IU] via SUBCUTANEOUS
  Administered 2017-12-13: 2 [IU] via SUBCUTANEOUS
  Administered 2017-12-14: 3 [IU] via SUBCUTANEOUS
  Administered 2017-12-14: 2 [IU] via SUBCUTANEOUS
  Administered 2017-12-14 – 2017-12-15 (×2): 1 [IU] via SUBCUTANEOUS
  Administered 2017-12-15: 3 [IU] via SUBCUTANEOUS
  Administered 2017-12-16 – 2017-12-17 (×2): 1 [IU] via SUBCUTANEOUS
  Administered 2017-12-17: 3 [IU] via SUBCUTANEOUS
  Administered 2017-12-18: 1 [IU] via SUBCUTANEOUS
  Administered 2017-12-18: 2 [IU] via SUBCUTANEOUS

## 2017-12-11 MED ORDER — LIDOCAINE HCL (PF) 1 % IJ SOLN: 5.0000 mL | INTRAMUSCULAR | Status: DC | PRN

## 2017-12-11 MED ORDER — ALBUMIN HUMAN 25 % IV SOLN
25.0000 g | Freq: Once | INTRAVENOUS | Status: AC
  Administered 2017-12-11: 25 g via INTRAVENOUS

## 2017-12-11 MED ORDER — HEPARIN SODIUM (PORCINE) 1000 UNIT/ML DIALYSIS
1000.0000 [IU] | INTRAMUSCULAR | Status: DC | PRN
  Administered 2017-12-12: 1000 [IU] via INTRAVENOUS_CENTRAL
  Administered 2017-12-13 (×2): 1700 [IU] via INTRAVENOUS_CENTRAL

## 2017-12-11 MED ORDER — ACETAMINOPHEN 325 MG PO TABS
650.0000 mg | ORAL_TABLET | ORAL | Status: DC | PRN
  Administered 2017-12-16: 650 mg via ORAL
  Filled 2017-12-11 (×2): qty 2

## 2017-12-11 MED ORDER — PANTOPRAZOLE SODIUM 40 MG PO TBEC
40.0000 mg | DELAYED_RELEASE_TABLET | Freq: Every day | ORAL | Status: DC
  Administered 2017-12-11 – 2017-12-18 (×8): 40 mg via ORAL
  Filled 2017-12-11 (×4): qty 1
  Filled 2017-12-11: qty 2
  Filled 2017-12-11 (×3): qty 1

## 2017-12-11 MED ORDER — ATORVASTATIN CALCIUM 40 MG PO TABS
40.0000 mg | ORAL_TABLET | Freq: Every day | ORAL | Status: DC
Start: 2017-12-11 — End: 2017-12-18
  Administered 2017-12-11 – 2017-12-17 (×7): 40 mg via ORAL
  Filled 2017-12-11 (×7): qty 1

## 2017-12-11 MED ORDER — LIDOCAINE-PRILOCAINE 2.5-2.5 % EX CREA: 1.0000 "application " | TOPICAL_CREAM | CUTANEOUS | Status: DC | PRN

## 2017-12-11 MED ORDER — MIDODRINE HCL 5 MG PO TABS
ORAL_TABLET | ORAL | Status: AC
  Administered 2017-12-11: 10 mg via ORAL
  Filled 2017-12-11: qty 2

## 2017-12-11 MED ORDER — PENTAFLUOROPROP-TETRAFLUOROETH EX AERO: 1.0000 "application " | INHALATION_SPRAY | CUTANEOUS | Status: DC | PRN

## 2017-12-11 MED ORDER — HEPARIN (PORCINE) IN NACL 100-0.45 UNIT/ML-% IJ SOLN
1450.0000 [IU]/h | INTRAMUSCULAR | Status: DC
  Administered 2017-12-11: 1000 [IU]/h via INTRAVENOUS
  Administered 2017-12-12: 1200 [IU]/h via INTRAVENOUS
  Administered 2017-12-13 – 2017-12-15 (×4): 1350 [IU]/h via INTRAVENOUS
  Filled 2017-12-11 (×9): qty 250

## 2017-12-11 MED ORDER — CHLORHEXIDINE GLUCONATE 4 % EX LIQD
60.0000 mL | Freq: Once | CUTANEOUS | Status: AC
  Administered 2017-12-12: 4 via TOPICAL
  Filled 2017-12-11: qty 60

## 2017-12-11 MED ORDER — LORAZEPAM 0.5 MG PO TABS
0.5000 mg | ORAL_TABLET | Freq: Two times a day (BID) | ORAL | Status: DC | PRN
  Administered 2017-12-11: 0.5 mg via ORAL
  Filled 2017-12-11: qty 1

## 2017-12-11 MED ORDER — METOCLOPRAMIDE HCL 10 MG PO TABS
5.0000 mg | ORAL_TABLET | Freq: Three times a day (TID) | ORAL | Status: DC
Start: 2017-12-11 — End: 2017-12-18
  Administered 2017-12-11 – 2017-12-18 (×19): 5 mg via ORAL
  Filled 2017-12-11 (×21): qty 1

## 2017-12-11 MED ORDER — DRONABINOL 2.5 MG PO CAPS
2.5000 mg | ORAL_CAPSULE | Freq: Two times a day (BID) | ORAL | Status: DC
  Administered 2017-12-11 – 2017-12-18 (×12): 2.5 mg via ORAL
  Filled 2017-12-11 (×14): qty 1

## 2017-12-11 MED ORDER — ONDANSETRON HCL 4 MG/2ML IJ SOLN: 4.0000 mg | Freq: Four times a day (QID) | INTRAMUSCULAR | Status: DC | PRN

## 2017-12-11 MED ORDER — METOCLOPRAMIDE HCL 10 MG PO TABS
10.0000 mg | ORAL_TABLET | Freq: Three times a day (TID) | ORAL | Status: DC
  Administered 2017-12-11 – 2017-12-12 (×3): 10 mg via ORAL
  Filled 2017-12-11 (×3): qty 1

## 2017-12-11 MED ORDER — SODIUM CHLORIDE 0.9 % IV SOLN
INTRAVENOUS | Status: DC
  Administered 2017-12-12: 14:00:00 via INTRAVENOUS

## 2017-12-11 MED ORDER — CEFAZOLIN SODIUM-DEXTROSE 2-4 GM/100ML-% IV SOLN
2.0000 g | INTRAVENOUS | Status: AC
  Administered 2017-12-12: 2 g via INTRAVENOUS
  Filled 2017-12-11 (×2): qty 100

## 2017-12-11 MED ORDER — INSULIN ASPART 100 UNIT/ML ~~LOC~~ SOLN: 0.0000 [IU] | Freq: Every day | SUBCUTANEOUS | Status: DC

## 2017-12-11 MED ORDER — DOCUSATE SODIUM 100 MG PO CAPS
200.0000 mg | ORAL_CAPSULE | Freq: Every day | ORAL | Status: DC
  Administered 2017-12-12 – 2017-12-18 (×7): 200 mg via ORAL
  Filled 2017-12-11 (×8): qty 2

## 2017-12-11 NOTE — Progress Notes (Signed)
ANTICOAGULATION CONSULT NOTE - Initial Consult  Pharmacy Consult for heparin Indication: LVAD  Allergies  Allergen Reactions  . Metformin And Related Diarrhea    Patient Measurements: Height: 5'7" Weight: 79.4 kg Heparin Dosing Weight: 79.4 kg  Vital Signs: Temp: 98.1 F (36.7 C) (04/08 2013) Temp Source: Oral (04/08 2013) BP: 96/82 (04/08 2100) Pulse Rate: 59 (04/08 2100)  Labs: Recent Labs    12/11/17 1020 12/11/17 2008  HGB  --  10.6*  HCT  --  34.1*  PLT  --  211  LABPROT 15.5*  --   INR 1.24  --   HEPARINUNFRC  --  1.01*  CREATININE  --  8.85*    Estimated Creatinine Clearance: 9.7 mL/min (A) (by C-G formula based on SCr of 8.85 mg/dL (H)).   Medical History: Past Medical History:  Diagnosis Date  . Angina   . ASCVD (arteriosclerotic cardiovascular disease)    , Anterior infarction 2005, LAD diagonal bifurcation intervention 03/2004  . Automatic implantable cardiac defibrillator -St. Jude's       . Benign neoplasm of colon   . CHF (congestive heart failure) ([place])   . Chronic systolic heart failure ([place])   . Coronary artery disease     Widely patent previously placed stents in the left anterior   . Crohn's disease ([place])   . 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 Dialysis patient ([place])   . Dyspnea   . Gastroparesis   . GERD (gastroesophageal reflux disease)   . High cholesterol   . Hyperlipidemia   . Hypersomnolent    Previous diagnosis of narcolepsy  . Hypertension, essential   . Ischemic cardiomyopathy    Ejection fraction 15-20% catheterization 2010  . Type II or unspecified type diabetes mellitus without mention of complication, not stated as uncontrolled   . Unspecified gastritis and gastroduodenitis without mention of hemorrhage     Assessment: 76 yom with history of LVAD presenting for AVF repair. On warfarin PTA - home regimen is 5 mg daily except 7.5 mg on Tuesday, Thursday. INR today is subtherapeutic at 1.24. Plan  to undergo thrombectomy on 4/9.   Initial heparin level is a 2 hour level, results show elevated level. Will recheck level for a true 8 hours steady state level. His heparin requirements are usually between 1200-1600 units/hr.   Goal of Therapy:  INR 2-3 Heparin level 0.3-0.7 units/ml Monitor platelets by anticoagulation protocol: Yes   Plan:  Recheck heparin level at 0200 in am for true heparin level.  Erin Hearing PharmD., BCPS Clinical Pharmacist 12/11/2017 9:10 PM

## 2017-12-11 NOTE — Progress Notes (Addendum)
ANTICOAGULATION CONSULT NOTE - Initial Consult  Pharmacy Consult for heparin Indication: LVAD  Allergies  Allergen Reactions  . Metformin And Related Diarrhea    Patient Measurements: Height: 5'7" Weight: 79.4 kg Heparin Dosing Weight: 79.4 kg  Vital Signs: Temp: 98 F (36.7 C) (04/08 1216) Temp Source: Oral (04/08 1216) BP: 127/99 (04/08 1216) Pulse Rate: 70 (04/08 1216)  Labs: Recent Labs    12/11/17 1020  LABPROT 15.5*  INR 1.24    CrCl cannot be calculated (Patient's most recent lab result is older than the maximum 21 days allowed.).   Medical History: Past Medical History:  Diagnosis Date  . Angina   . ASCVD (arteriosclerotic cardiovascular disease)    , Anterior infarction 2005, LAD diagonal bifurcation intervention 03/2004  . Automatic implantable cardiac defibrillator -St. Jude's       . Benign neoplasm of colon   . CHF (congestive heart failure) ([place])   . Chronic systolic heart failure ([place])   . Coronary artery disease     Widely patent previously placed stents in the left anterior   . Crohn's disease ([place])   . 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 Dialysis patient ([place])   . Dyspnea   . Gastroparesis   . GERD (gastroesophageal reflux disease)   . High cholesterol   . Hyperlipidemia   . Hypersomnolent    Previous diagnosis of narcolepsy  . Hypertension, essential   . Ischemic cardiomyopathy    Ejection fraction 15-20% catheterization 2010  . Type II or unspecified type diabetes mellitus without mention of complication, not stated as uncontrolled   . Unspecified gastritis and gastroduodenitis without mention of hemorrhage     Medications:  Scheduled:  . [START ON 12/12/2017] amLODipine  5 mg Oral Once per day on Sun Tue Thu Sat  . aspirin EC  81 mg Oral Daily  . atorvastatin  40 mg Oral q1800  . chlorhexidine  60 mL Topical Once   And  . [START ON 12/12/2017] chlorhexidine  60 mL Topical Once  . docusate sodium  200 mg Oral  Daily  . dronabinol  2.5 mg Oral BID AC  . feeding supplement (NEPRO CARB STEADY)  237 mL Oral Daily  . [START ON 12/12/2017] hydrALAZINE  37.5 mg Oral 3 times per day on Sun Tue Thu Sat  . insulin aspart  0-5 Units Subcutaneous QHS  . insulin aspart  0-9 Units Subcutaneous TID WC  . insulin aspart  3 Units Subcutaneous TID WC  . metoCLOPramide  10 mg Oral TID  . metoCLOPramide  5 mg Oral TID AC  . pantoprazole  40 mg Oral Daily  . sildenafil  20 mg Oral TID    Assessment: 40 yom with history of LVAD presenting for AVF repair. On warfarin PTA - home regimen is 5 mg daily except 7.5 mg on Tuesday, Thursday. INR today is subtherapeutic at 1.24. Plan to undergo thrombectomy on 4/9.   Will start heparin infusion to bridge during the procedure. No signs/symptoms of bleeding.   Goal of Therapy:  INR 2-3 Heparin level 0.3-0.7 units/ml Monitor platelets by anticoagulation protocol: Yes   Plan:  Hold warfarin therapy Start heparin infusion at 1000 units/hr Check anti-Xa level in 6 hours and daily while on heparin Continue to monitor H&H and platelets  Doylene Canard, PharmD Clinical Pharmacist  Pager: [phone] Clinical Phone for 12/11/2017 until 3:30pm: [phone] If after 3:30pm, please call main pharmacy at [phone] 12/11/2017,1:10 PM

## 2017-12-11 NOTE — Progress Notes (Signed)
HD tx initiated via HD cath w/o problem, AP: pull/push/flush well, VP: sluggish pull, push/flush well, VSS, will cont to monitor while on HD tx

## 2017-12-11 NOTE — Progress Notes (Signed)
   Daily Progress Note   Assessment/Planning:   s/p RUA AVG, LVAD   Patient never had much of a thrill.  Bruit sounds the same to me as immediate post-op.    Unclear why no bruit in proximal segment as occlusion of the AVG should have eliminated any bruit in the AVG, suggesting possible sub-total occlusion of proximal segment.  Would obtain RUA AVG access duplex to help guide the choice of intervention.  Would keep patient on OR scheduled, might need to change procedure to R arm fistulogram, possible intervention vs RUA thrombectomy and revision.  Anticoagulation per Heart failure team.   Subjective   No complaints   Objective   Vitals:   12/11/17 0956  BP: (!) 117/105  Temp: 98.4 F (36.9 C)    No intake or output data in the 24 hours ending 12/11/17 1146  VASC No thrill, +bruit in distal half of AVG    Laboratory   CBC CBC Latest Ref Rng & Units 11/16/2017 11/04/2017 11/03/2017  WBC 4.0 - 10.5 K/uL 6.8 7.2 8.3  Hemoglobin 13.0 - 17.0 g/dL 11.1(L) 8.4(L) 8.2(L)  Hematocrit 39.0 - 52.0 % 36.0(L) 27.3(L) 26.3(L)  Platelets 150 - 400 K/uL 211 215 214    BMET    Component Value Date/Time   NA 138 11/16/2017 0917   NA 144 05/11/2017 0938   K 4.2 11/16/2017 0917   CL 101 11/16/2017 0917   CO2 24 11/16/2017 0917   GLUCOSE 181 (H) 11/16/2017 0917   BUN 24 (H) 11/16/2017 0917   BUN 68 (H) 05/11/2017 0938   CREATININE 4.37 (H) 11/16/2017 0917   CREATININE 1.63 (H) 11/09/2016 1505   CALCIUM 8.8 (L) 11/16/2017 0917   CALCIUM 8.7 06/27/2017 1152   GFRNONAA 14 (L) 11/16/2017 0917   GFRNONAA 48 (L) 11/09/2016 1505   GFRAA 16 (L) 11/16/2017 0917   GFRAA 55 (L) 11/09/2016 Wisner, MD, FACS [facility] [place] Office: [phone] Pager: [phone]  12/11/2017, 11:46 AM

## 2017-12-11 NOTE — H&P (Addendum)
Advanced Heart Failure VAD History and Physical Note   PCP-Cardiologist: Dr Aundra Dubin  Reason for Admission: Heparin bridge for AVF repair.   HPI:    [name] is a 52 y.o. male  with history of CAD s/p anterior MI in 2010 and NSTEMI in 3/18 with DES to pRCA and mLAD and ischemic cardiomyopathy now s/p Heartmate 3 LVAD and ESRD presents for followup of CHF/LVAD.  Patient developed low output heart failure.  He was not deemed to be a good transplant candidate.  He was admitted in 10/18 and Heartmate 3 LVAD was placed.  He had baseline CKD stage 3 likely from diabetic nephropathy and CHF.  He developed peri-op AKI and ended up on dialysis.  No renal recovery to date.  St Jude ICD has been turned back on and ramp echo was done prior to discharge in 10/18, speed increased to 5600 rpm.   He was admitted in 2/19 for AV graft placement.  While in the hospital, we turned down his speed.  Since then, he has had considerably fewer PI events on HD days. Overall course has been complicated by diabetic gastroparesis with intermittent episodes of N/V.   Last seen in clinic 11/16/17. Was doing well at that time.   Pt is s/p RUE AV graft with ligation of R 1st stage brachial vein transposition by Dr. Bridgett Larsson.  At visit 11/28/17 at [facility] office, they were unable to palpate graft or hear bruit and unable to hear with doppler. Dr. Donnetta Hutching examined and concluded it was occluded. Plan for admission 12/11/17 for bridging off of coumadin.   He is feeling OK today. Denies SOB, CP, or N/V. No BRBPR or melena. Denies problems with his drivelines.  States he had mild pain around his tunneled RIJ TDC with HD on Friday, but exam unremarkable. N/V has been well controlled.   LVAD INTERROGATION:  HeartMate II LVAD:  Flow 3.3 liters/min, speed 5500, power 4.1, PI 7.4. PI events 6-8 daily, 10-15 on HD days.  Review of systems complete and found to be negative unless listed in HPI.    Home Medications Prior to Admission  medications   Medication Sig Start Date End Date Taking? Authorizing Provider  amLODipine (NORVASC) 5 MG tablet Take 1 tablet (5 mg total) by mouth daily. On non dialysis days. 09/17/17  Yes Daune Perch, NP  aspirin EC 81 MG tablet Take 1 tablet (81 mg total) by mouth daily. 07/26/17  Yes Larey Dresser, MD  atorvastatin (LIPITOR) 40 MG tablet Take 1 tablet (40 mg total) daily at 6 PM by mouth. 07/16/17  Yes Bhagat, Bhavinkumar, PA  docusate sodium (COLACE) 100 MG capsule Take 2 capsules (200 mg total) by mouth daily. 07/03/17  Yes Clegg, Amy D, NP  dronabinol (MARINOL) 2.5 MG capsule Take 1 capsule (2.5 mg total) by mouth 2 (two) times daily before lunch and supper. 09/16/17  Yes Daune Perch, NP  hydrALAZINE (APRESOLINE) 25 MG tablet Take 1.5 tablets (37.5 mg total) by mouth 3 (three) times daily. Take on non-hd days 10/24/17  Yes Larey Dresser, MD  insulin aspart (NOVOLOG) 100 UNIT/ML injection Inject 3 Units into the skin 3 (three) times daily with meals. Patient taking differently: Inject 5 Units into the skin 3 (three) times daily with meals.  07/03/17  Yes Clegg, Amy D, NP  insulin glargine (LANTUS) 100 UNIT/ML injection Inject 0.05 mLs (5 Units total) into the skin daily. Patient taking differently: Inject 10 Units into the skin daily.  07/03/17  Yes Clegg, Amy D, NP  LORazepam (ATIVAN) 0.5 MG tablet Take 1 tablet (0.5 mg total) by mouth every 12 (twelve) hours as needed for anxiety or sleep. 08/04/17  Yes Clegg, Amy D, NP  metoCLOPramide (REGLAN) 5 MG tablet Take 1 tablet (5 mg total) by mouth 4 (four) times daily -  before meals and at bedtime. Patient taking differently: Take 10 mg by mouth 3 (three) times daily.  08/04/17  Yes Clegg, Amy D, NP  Nutritional Supplements (FEEDING SUPPLEMENT, NEPRO CARB STEADY,) LIQD Take 237 mLs by mouth 3 (three) times daily with meals. Patient taking differently: Take 237 mLs by mouth daily.  09/16/17  Yes Daune Perch, NP  ondansetron (ZOFRAN  ODT) 4 MG disintegrating tablet Take 1 tablet (4 mg total) by mouth every 6 (six) hours as needed for nausea or vomiting. 01/10/17  Yes Esterwood, Amy S, PA-C  pantoprazole (PROTONIX) 40 MG tablet Take 40 mg by mouth daily.   Yes [provider]  promethazine (PHENERGAN) 12.5 MG tablet Take 1 tablet (12.5 mg total) by mouth every 6 (six) hours as needed for nausea or vomiting. Patient taking differently: Take 25 mg by mouth every 6 (six) hours as needed for nausea or vomiting.  09/16/17  Yes Daune Perch, NP  sildenafil (REVATIO) 20 MG tablet Take 1 tablet (20 mg total) by mouth 3 (three) times daily. 07/26/17  Yes Larey Dresser, MD  warfarin (COUMADIN) 5 MG tablet Take 5 mg (1 tablet) daily except 7.5 mg (1 and 1/2 tablets) on Tuesday/Thursday   Yes [provider]  Continuous Blood Gluc Sensor ([place]) MISC Apply 1 reader to the skin every 10 days. Patient not taking: Reported on 12/05/2017 08/17/17   Zenia Resides, MD  glucose blood (FREESTYLE PRECISION NEO TEST) test strip Use as instructed Patient not taking: Reported on 12/05/2017 08/17/17   Zenia Resides, MD    Past Medical History: Past Medical History:  Diagnosis Date  . Angina   . ASCVD (arteriosclerotic cardiovascular disease)    , Anterior infarction 2005, LAD diagonal bifurcation intervention 03/2004  . Automatic implantable cardiac defibrillator -St. Jude's       . Benign neoplasm of colon   . CHF (congestive heart failure) ([place])   . Chronic systolic heart failure ([place])   . Coronary artery disease     Widely patent previously placed stents in the left anterior   . Crohn's disease ([place])   . 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 Dialysis patient ([place])   . Dyspnea   . Gastroparesis   . GERD (gastroesophageal reflux disease)   . High cholesterol   . Hyperlipidemia   . Hypersomnolent    Previous diagnosis of narcolepsy  . Hypertension, essential   .  Ischemic cardiomyopathy    Ejection fraction 15-20% catheterization 2010  . Type II or unspecified type diabetes mellitus without mention of complication, not stated as uncontrolled   . Unspecified gastritis and gastroduodenitis without mention of hemorrhage    Past Surgical History: Past Surgical History:  Procedure Laterality Date  . APPENDECTOMY    . AV FISTULA PLACEMENT Right 06/26/2017   Procedure: ARTERIOVENOUS (AV) FISTULA CREATION VERSUS GRAFT RIGHT  ARM;  Surgeon: Conrad Westville, MD;  Location: [place];  Service: Vascular;  Laterality: Right;  . AV FISTULA PLACEMENT Right 10/31/2017   Procedure: INSERTION OF ARTERIOVENOUS (AV) ARTEGRAFT,  RIGHT UPPER ARM;  Surgeon: Conrad Westmoreland, MD;  Location[facility]  OR;  Service: Vascular;  Laterality: Right;  . CARDIAC DEFIBRILLATOR PLACEMENT  2010   St. Jude ICD  . COLECTOMY  ~ 2003   "for Crohn's" ascending colon.    . CORONARY STENT INTERVENTION N/A 11/25/2016   Procedure: Coronary Stent Intervention;  Surgeon: Leonie Man, MD;  Location: [place] CV LAB;  Service: Cardiovascular;  Laterality: N/A;  . EP IMPLANTABLE DEVICE N/A 09/14/2016   Procedure: ICD Generator Changeout;  Surgeon: Deboraha Sprang, MD;  Location: [place] CV LAB;  Service: Cardiovascular;  Laterality: N/A;  . ESOPHAGOGASTRODUODENOSCOPY N/A 07/12/2017   Procedure: ESOPHAGOGASTRODUODENOSCOPY (EGD);  Surgeon: Jerene Bears, MD;  Location: [facility] ENDOSCOPY;  Service: Gastroenterology;  Laterality: N/A;  VAD pt so VAD team will need to accompany pt.   [name] FETAL SURGERY FOR CONGENITAL HERNIA     ????? pt has no knowledge of this..    . INGUINAL HERNIA REPAIR    . INSERTION OF DIALYSIS CATHETER Right 06/26/2017   Procedure: INSERTION OF TUNNELED  DIALYSIS CATHETER RIGHT INTERNAL JUGULAR;  Surgeon: Conrad Boyd, MD;  Location: [place];  Service: Vascular;  Laterality: Right;  . INSERTION OF IMPLANTABLE LEFT VENTRICULAR ASSIST DEVICE N/A 06/12/2017   Procedure: INSERTION OF IMPLANTABLE  LEFT VENTRICULAR ASSIST [place] 3;  Surgeon: Ivin Poot, MD;  Location: [place];  Service: Open Heart Surgery;  Laterality: N/A;  HM3 LVAD  CIRC ARREST  NITRIC OXIDE  . IR THORACENTESIS ASP PLEURAL SPACE W/IMG GUIDE  06/28/2017  . LIGATION OF ARTERIOVENOUS  FISTULA Right 10/31/2017   Procedure: LIGATION OF BRACHIO-BASILIC VEIN TRANSPOSITION RIGHT ARM;  Surgeon: Conrad Matthews, MD;  Location: [place];  Service: Vascular;  Laterality: Right;  . MULTIPLE EXTRACTIONS WITH ALVEOLOPLASTY N/A 06/08/2017   Procedure: MULTIPLE EXTRACTION WITH ALVEOLOPLASTY AND PRE PROSTHETIC SURGERY AS NEEDED;  Surgeon: Lenn Cal, DDS;  Location: [place];  Service: Oral Surgery;  Laterality: N/A;  . RIGHT HEART CATH N/A 06/07/2017   Procedure: RIGHT HEART CATH;  Surgeon: Larey Dresser, MD;  Location: [place] CV LAB;  Service: Cardiovascular;  Laterality: N/A;  . RIGHT/LEFT HEART CATH AND CORONARY ANGIOGRAPHY N/A 11/23/2016   Procedure: Right/Left Heart Cath and Coronary Angiography;  Surgeon: Larey Dresser, MD;  Location: [place] CV LAB;  Service: Cardiovascular;  Laterality: N/A;  . TEE WITHOUT CARDIOVERSION N/A 06/12/2017   Procedure: TRANSESOPHAGEAL ECHOCARDIOGRAM (TEE);  Surgeon: Prescott Gum, Collier Salina, MD;  Location: [place];  Service: Open Heart Surgery;  Laterality: N/A;   Family History: Family History  Problem Relation Age of Onset  . Colon polyps Mother   . Diabetes Mother   . Heart attack Father   . Diabetes Father   . Stroke Paternal Grandfather   . Colitis Sister   . Heart disease Brother   . Colitis Sister   . Colon cancer Neg Hx    Social History: Social History   Socioeconomic History  . Marital status: Married    Spouse name: Not on file  . Number of children: Not on file  . Years of education: Not on file  . Highest education level: Not on file  Occupational History  . Not on file  Social Needs  . Financial resource strain: Not on file  . Food insecurity:     Worry: Not on file    Inability: Not on file  . Transportation needs:    Medical: Not on file    Non-medical: Not on file  Tobacco Use  .  Smoking status: Never Smoker  . Smokeless tobacco: Never Used  Substance and Sexual Activity  . Alcohol use: No  . Drug use: No  . Sexual activity: Never  Lifestyle  . Physical activity:    Days per week: Not on file    Minutes per session: Not on file  . Stress: Not on file  Relationships  . Social connections:    Talks on phone: Not on file    Gets together: Not on file    Attends religious service: Not on file    Active member of club or organization: Not on file    Attends meetings of clubs or organizations: Not on file    Relationship status: Not on file  Other Topics Concern  . Not on file  Social History Narrative  . Not on file   Allergies:  Allergies  Allergen Reactions  . Metformin And Related Diarrhea   Objective:    Vital Signs:   Temp:  [98.4 F (36.9 C)] 98.4 F (36.9 C) (04/08 0956) BP: (117)/(105) 117/105 (04/08 0956)     Mean arterial Pressure typically 90-100s. (HD pending today.)  Physical Exam    General:  Well appearing. No resp difficulty HEENT: Normal Neck: supple. JVP 9-10 cm. Carotids 2+ bilat; no bruits. No lymphadenopathy or thyromegaly appreciated. Cor: Mechanical heart sounds with LVAD hum present. RIJ TDC.  Lungs: Clear Abdomen: soft, nontender, nondistended. No hepatosplenomegaly. No bruits or masses. Good bowel sounds. Driveline: C/D/I; securement device intact and driveline incorporated Extremities: no cyanosis, clubbing, rash, edema Neuro: alert & orientedx3, cranial nerves grossly intact. moves all 4 extremities w/o difficulty. Affect pleasant  Telemetry   Not yet connected.  EKG   No new tracings.    Labs    Basic Metabolic Panel: No results for input(s): NA, K, CL, CO2, GLUCOSE, BUN, CREATININE, CALCIUM, MG, PHOS in the last 168 hours.  Liver Function Tests: No results for  input(s): AST, ALT, ALKPHOS, BILITOT, PROT, ALBUMIN in the last 168 hours. No results for input(s): LIPASE, AMYLASE in the last 168 hours. No results for input(s): AMMONIA in the last 168 hours.  CBC: No results for input(s): WBC, NEUTROABS, HGB, HCT, MCV, PLT in the last 168 hours.  Cardiac Enzymes: No results for input(s): CKTOTAL, CKMB, CKMBINDEX, TROPONINI in the last 168 hours.  BNP: BNP (last 3 results) Recent Labs    06/18/17 2305 06/26/17 0004 07/03/17 0037  BNP 746.0* 1,409.3* 758.9*    ProBNP (last 3 results) No results for input(s): PROBNP in the last 8760 hours.   CBG: No results for input(s): GLUCAP in the last 168 hours.  Coagulation Studies: No results for input(s): LABPROT, INR in the last 72 hours.  Imaging     No results found.  Patient Profile:   [name] is a 52 y.o. 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   Planned admission for heparin bridge for R AVF repair 12/12/17.  Assessment/Plan:    1. ESRD  - Post LVAD placement  - RUE fistula occluded.  - Plan for AVF repair tomorrow pm.   2. Chronic systolic CHF: Ischemic cardiomyopathy. St Jude ICD. NSTEMI in March 2018 with DES to LAD and RCA, complicated by cardiogenic shock, low output requiring milrinone. Echo 8/18 with EF 20-25%, moderate MR. PX (8/18) with severe functional impairment due to  HF. He is now s/p HM3 LVAD placement. LVAD parameters reviewed today and stable, fewer PI events at HD now with lower speed.  No complaints. MAP 103 but has not taken BP-active meds today yet.  Hemoglobin stable. NYHA II. Volume status stable on exam.  - Continue amlodipine 5 mg daily on non HD days.  - Continue ASA 81. Coumadin on hold for OR tomorrow.  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  - Continue hydralazine 37.5 mg  tid on NON-HD days.  - He has been [facility] to [facility] for evaluation for heart/kidney transplant.   3. CAD: NSTEMI in 3/18. LHC with 99% ulcerated lesion proximal RCA with left to right collaterals, 95% mid LAD stenosis after mid LAD stent. s/p PCI to RCA and LAD on 11/25/16.  - Continue statin and ASA. - No s/s of ischemia.     4. h/o DVTs: Factor V Leiden heterozygote. - Coumadin on hold for OR tomorrow as above.   5. Diabetic gastroparesis: Difficult to manage.  - Uses Phenergan suppositories as needed.  - Has seen gastroparesis specialist at [facility] and workup is continuing. . - Continue reglan 5 mg TID/AC  6. DM:  - SSI while in house.   7. HTN:  - MAPs run slightly elevated on NON-HD days, but meds limited on HD days due to hypotension. Labile. Meds as above.   8. PAD: - Stable.  Moderate bilateral disease on ABIs in 8/18.  Denies claudication currently, no pedal ulcers.   9. Atrial flutter: Paroxysmal.   - NSR recently.   Nephrology contacted to arrange HD for today.  Will schedule LIJ CVL due to [facility] and Left ICD.   I reviewed the LVAD parameters from today, and compared the results to the patient's prior recorded data.  No programming changes were made.  The LVAD is functioning within specified parameters.  The patient performs LVAD self-test daily.  LVAD interrogation was negative for any significant power changes, alarms or PI events/speed drops.  LVAD equipment check completed and is in good working order.  Back-up equipment present.   LVAD education done on emergency procedures and precautions and reviewed exit site care.  Length of Stay: 0  Annamaria Helling 12/11/2017, 10:14 AM  VAD Team Pager [phone] (7am - 7am) +++VAD ISSUES ONLY+++   Advanced Heart Failure Team Pager [phone] (M-F; [place])  Please contact [facility] for night-coverage after hours (4p -7a ) and weekends on [email or web address] for all non- LVAD Issues  Patient seen with PA,  agree with the above note.  He is doing well symptomatically, no recent nausea/vomiting. He continues to metoclopromide tid for gastroparesis.   INR 1.2 today, will need heparin gtt started.   Plan for fistula repair tomorrow in OR.   Awaiting CBC, BMET.   He will need HD today, nephrology notified.   Brodrick Curran 12/11/2017 1:28 PM

## 2017-12-11 NOTE — Progress Notes (Signed)
*  PRELIMINARY RESULTS* Vascular Ultrasound Duplex Dialysis Access (AVF, AGV) has been completed.  The AVG of the right upper extremity was visualized with no flow via color or pulsed wave Doppler. Therefore, the graft appears to be possibly occluded.   Preliminary results discussed with Dr. Bridgett Larsson.  12/11/2017 3:09 PM Maudry Mayhew, BS, RVT, RDCS, RDMS

## 2017-12-11 NOTE — Consult Note (Addendum)
[name] Renal Consultation Note    Indication for Consultation:  Management of ESRD/hemodialysis; anemia, hypertension/volume and secondary hyperparathyroidism  [name], [name] Cheek, MD  HPI: [name] is a 52 y.o. male. 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  Past medical history significant for ischemic cardiomyopathy with LVAD, CAD, DMT2 with gastroparesis, and GERD.  Patient has been admitted for heparin bridging off of coumadin to allow for scheduled thrombectomy of occulded right upper extremity AVG by Dr. Bridgett [name].    Patient seen as OP at [facility] on 3/26 for routine follow up of RUA AVG placed by Dr. Bridgett [name] on 10/31/17.  The PA identified his AVG had no palpable thrill, audible bruit by auscultation or doppler.  Dr. Donnetta [name] also examined the patient, and the AVG was determined to be occluded, thrombectomy was scheduled.  Already had U/S/doppler, no flow  Seen and examined at bedside.  Patient states he has been tolerating HD well via R IJ TDC, reaching his dry weight most treatments.  No current complaints.  Denies CP, SOB, n/v/d, constipation, weakness, dizziness, fever, and chills.    Past Medical History:  Diagnosis Date  . Angina   . ASCVD (arteriosclerotic cardiovascular disease)    , Anterior infarction 2005, LAD diagonal bifurcation intervention 03/2004  . Automatic implantable cardiac defibrillator -St. Jude's       . Benign neoplasm of colon   . CHF (congestive heart failure) ([place])   . Chronic systolic heart failure ([place])   . Coronary artery disease     Widely patent previously placed stents in the left anterior   . Crohn's disease ([place])   . 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 Dialysis patient ([place])   . Dyspnea   . Gastroparesis   . GERD (gastroesophageal reflux disease)   . High cholesterol   . Hyperlipidemia   . Hypersomnolent    Previous diagnosis of narcolepsy  . Hypertension, essential   .  Ischemic cardiomyopathy    Ejection fraction 15-20% catheterization 2010  . Type II or unspecified type diabetes mellitus without mention of complication, not stated as uncontrolled   . Unspecified gastritis and gastroduodenitis without mention of hemorrhage    Past Surgical History:  Procedure Laterality Date  . APPENDECTOMY    . AV FISTULA PLACEMENT Right 06/26/2017   Procedure: ARTERIOVENOUS (AV) FISTULA CREATION VERSUS GRAFT RIGHT  ARM;  Surgeon: Conrad Horntown, MD;  Location: [facility] OR;  Service: Vascular;  Laterality: Right;  . AV FISTULA PLACEMENT Right 10/31/2017   Procedure: INSERTION OF ARTERIOVENOUS (AV) ARTEGRAFT,  RIGHT UPPER ARM;  Surgeon: Conrad Cotesfield, MD;  Location: [place];  Service: Vascular;  Laterality: Right;  . CARDIAC DEFIBRILLATOR PLACEMENT  2010   St. Jude ICD  . COLECTOMY  ~ 2003   "for Crohn's" ascending colon.    . CORONARY STENT INTERVENTION N/A 11/25/2016   Procedure: Coronary Stent Intervention;  Surgeon: Leonie Man, MD;  Location: [place] CV LAB;  Service: Cardiovascular;  Laterality: N/A;  . EP IMPLANTABLE DEVICE N/A 09/14/2016   Procedure: ICD Generator Changeout;  Surgeon: Deboraha Sprang, MD;  Location: [place] [facility] LAB;  Service: Cardiovascular;  Laterality: N/A;  . ESOPHAGOGASTRODUODENOSCOPY N/A 07/12/2017   Procedure: ESOPHAGOGASTRODUODENOSCOPY (EGD);  Surgeon: Jerene Bears, MD;  Location: [facility] ENDOSCOPY;  Service: Gastroenterology;  Laterality: N/A;  VAD pt so VAD team will need to accompany pt.   [name] FETAL SURGERY FOR  CONGENITAL HERNIA     ????? pt has no knowledge of this..    . INGUINAL HERNIA REPAIR    . INSERTION OF DIALYSIS CATHETER Right 06/26/2017   Procedure: INSERTION OF TUNNELED  DIALYSIS CATHETER RIGHT INTERNAL JUGULAR;  Surgeon: Conrad Albion, MD;  Location: [place];  Service: Vascular;  Laterality: Right;  . INSERTION OF IMPLANTABLE LEFT VENTRICULAR ASSIST DEVICE N/A 06/12/2017   Procedure: INSERTION OF IMPLANTABLE LEFT VENTRICULAR ASSIST  [place] 3;  Surgeon: Ivin Poot, MD;  Location: [place];  Service: Open Heart Surgery;  Laterality: N/A;  HM3 LVAD  CIRC ARREST  NITRIC OXIDE  . IR THORACENTESIS ASP PLEURAL SPACE W/IMG GUIDE  06/28/2017  . LIGATION OF ARTERIOVENOUS  FISTULA Right 10/31/2017   Procedure: LIGATION OF BRACHIO-BASILIC VEIN TRANSPOSITION RIGHT ARM;  Surgeon: Conrad Leslie, MD;  Location: [place];  Service: Vascular;  Laterality: Right;  . MULTIPLE EXTRACTIONS WITH ALVEOLOPLASTY N/A 06/08/2017   Procedure: MULTIPLE EXTRACTION WITH ALVEOLOPLASTY AND PRE PROSTHETIC SURGERY AS NEEDED;  Surgeon: Lenn Cal, DDS;  Location: [place];  Service: Oral Surgery;  Laterality: N/A;  . RIGHT HEART CATH N/A 06/07/2017   Procedure: RIGHT HEART CATH;  Surgeon: Larey Dresser, MD;  Location: [place] CV LAB;  Service: Cardiovascular;  Laterality: N/A;  . RIGHT/LEFT HEART CATH AND CORONARY ANGIOGRAPHY N/A 11/23/2016   Procedure: Right/Left Heart Cath and Coronary Angiography;  Surgeon: Larey Dresser, MD;  Location: [place] [facility];  Service: Cardiovascular;  Laterality: N/A;  . TEE WITHOUT CARDIOVERSION N/A 06/12/2017   Procedure: TRANSESOPHAGEAL ECHOCARDIOGRAM (TEE);  Surgeon: Prescott Gum, Collier Salina, MD;  Location: [place];  Service: Open Heart Surgery;  Laterality: N/A;   Family History  Problem Relation Age of Onset  . Colon polyps Mother   . Diabetes Mother   . Heart attack Father   . Diabetes Father   . Stroke Paternal Grandfather   . Colitis Sister   . Heart disease Brother   . Colitis Sister   . Colon cancer Neg Hx    Social History:  reports that he has never smoked. He has never used smokeless tobacco. He reports that he does not drink alcohol or use drugs. Allergies  Allergen Reactions  . Metformin And Related Diarrhea   Prior to Admission medications   Medication Sig Start Date End Date Taking? Authorizing Provider  amLODipine (NORVASC) 5 MG tablet Take 1 tablet (5 mg total) by mouth daily. On  non dialysis days. 09/17/17  Yes Daune Perch, NP  aspirin EC 81 MG tablet Take 1 tablet (81 mg total) by mouth daily. 07/26/17  Yes Larey Dresser, MD  atorvastatin (LIPITOR) 40 MG tablet Take 1 tablet (40 mg total) daily at 6 PM by mouth. 07/16/17  Yes Bhagat, Bhavinkumar, PA  docusate sodium (COLACE) 100 MG capsule Take 2 capsules (200 mg total) by mouth daily. 07/03/17  Yes Clegg, Amy D, NP  dronabinol (MARINOL) 2.5 MG capsule Take 1 capsule (2.5 mg total) by mouth 2 (two) times daily before lunch and supper. 09/16/17  Yes Daune Perch, NP  hydrALAZINE (APRESOLINE) 25 MG tablet Take 1.5 tablets (37.5 mg total) by mouth 3 (three) times daily. Take on non-hd days 10/24/17  Yes Larey Dresser, MD  insulin aspart (NOVOLOG) 100 UNIT/ML injection Inject 3 Units into the skin 3 (three) times daily with meals. Patient taking differently: Inject 5 Units into the skin 3 (three) times daily with meals.  07/03/17  Yes Clegg, Amy D,  NP  insulin glargine (LANTUS) 100 UNIT/ML injection Inject 0.05 mLs (5 Units total) into the skin daily. Patient taking differently: Inject 10 Units into the skin daily.  07/03/17  Yes Clegg, Amy D, NP  LORazepam (ATIVAN) 0.5 MG tablet Take 1 tablet (0.5 mg total) by mouth every 12 (twelve) hours as needed for anxiety or sleep. 08/04/17  Yes Clegg, Amy D, NP  metoCLOPramide (REGLAN) 5 MG tablet Take 1 tablet (5 mg total) by mouth 4 (four) times daily -  before meals and at bedtime. Patient taking differently: Take 10 mg by mouth 3 (three) times daily.  08/04/17  Yes Clegg, Amy D, NP  Nutritional Supplements (FEEDING SUPPLEMENT, NEPRO CARB STEADY,) LIQD Take 237 mLs by mouth 3 (three) times daily with meals. Patient taking differently: Take 237 mLs by mouth daily.  09/16/17  Yes Daune Perch, NP  ondansetron (ZOFRAN ODT) 4 MG disintegrating tablet Take 1 tablet (4 mg total) by mouth every 6 (six) hours as needed for nausea or vomiting. 01/10/17  Yes Esterwood, Amy S, PA-C   pantoprazole (PROTONIX) 40 MG tablet Take 40 mg by mouth daily.   Yes [provider]  promethazine (PHENERGAN) 12.5 MG tablet Take 1 tablet (12.5 mg total) by mouth every 6 (six) hours as needed for nausea or vomiting. Patient taking differently: Take 25 mg by mouth every 6 (six) hours as needed for nausea or vomiting.  09/16/17  Yes Daune Perch, NP  sildenafil (REVATIO) 20 MG tablet Take 1 tablet (20 mg total) by mouth 3 (three) times daily. 07/26/17  Yes Larey Dresser, MD  warfarin (COUMADIN) 5 MG tablet Take 5 mg (1 tablet) daily except 7.5 mg (1 and 1/2 tablets) on Tuesday/Thursday   Yes [provider]  Continuous Blood Gluc Sensor ([place]) MISC Apply 1 reader to the skin every 10 days. Patient not taking: Reported on 12/05/2017 08/17/17   Zenia Resides, MD  glucose blood (FREESTYLE PRECISION NEO TEST) test strip Use as instructed Patient not taking: Reported on 12/05/2017 08/17/17   Zenia Resides, MD   Current Facility-Administered Medications  Medication Dose Route Frequency Provider Last Rate Last Dose  . 0.9 %  sodium chloride infusion   Intravenous Continuous Conrad Cubero, MD      . ceFAZolin (ANCEF) IVPB 2g/100 mL premix  2 g Intravenous 30 min Pre-Op Conrad [place], MD      . chlorhexidine (HIBICLENS) 4 % liquid 4 application  60 mL Topical Once Conrad Redmond, MD       And  . Derrill Memo ON 12/12/2017] chlorhexidine (HIBICLENS) 4 % liquid 4 application  60 mL Topical Once Conrad [place], MD      . Derrill Memo ON 12/12/2017] hydrALAZINE (APRESOLINE) tablet 37.5 mg  37.5 mg Oral 3 times per day on Sun Tue Thu Sat Shirley Friar, PA-C      . metoCLOPramide [facility]) tablet 5 mg  5 mg Oral TID AC Shirley Friar, PA-C       ROS: All others negative except those listed in HPI.  Physical Exam: Vitals:   12/11/17 0956  BP: (!) 117/105  Temp: 98.4 F (36.9 C)     General: WDWN, NAD, well appearing male Head: NCAT, sclera not  icteric, MMM Neck: Supple. No JVD Lungs: CTA bilaterally. No wheeze, rales or rhonchi. Breathing is unlabored. Heart: RRR. No murmur, rubs or gallops.  Abdomen: soft, nontender, +BS, no guarding, no rebound tenderness M/S:  Equal strength  b/l in upper and lower extremities.  Lower extremities:no edema, ischemic changes, or open wounds  Neuro: A&Ox3. Moves all extremities spontaneously. Psych:  Responds to questions appropriately with a normal affect. Dialysis Access: R IJ TDC, RU AVG - no thrill soft bruit in distal portion, no bruit in proximal portion,  Dialysis Orders:  MWF - [facility]  4hrs 38mn, BFR 400, DFR 800, 3K/ 2Ca ,  EDW 80kg (includes 7lb LVAD battery which is not used while admitted d/t wall hook up)  Access: L IJ TDC, LU AVF maturing - possibly occluded Heparin 8000 Unit bolus   Venofer 579mq wk - last 4/5 Calcitriol 0.41m69mqHD TIW  Last Labs: 12/06/17 Hgb 11.2,  K 3.9, Ca 9.1, P4.4, Alb 3.9 11/15/17: TSAT 23%   Assessment/Plan: 1.  RU AVG occulusion - ?sub-occulusion per Dr. CheBridgett LarssonUA duplex followed by possible fistulogram w/intervention vs thrombectomy and revision. Admitted today for coumadin-heparin bridge. IR to place tunneled central cath today per Dr. CheBridgett LarssonDr. BenHaroldine Lawsquest for IV access.   2.  ESRD - Continue at bedside while admitted per regular MWF schedule via L IJ TDC. K 4.2. Holding heparin w/HD. 3.  Hypertension/volume - BP elevated today.  Titrate down volume as tolerated.  4.  Anemia  - No labs so far. Last OP Hgb 11.2.  5.  Secondary Hyperparathyroidism -  OP Ca and P within goal. Continue VDRA. Not on binders. 6.  Nutrition - Renal diet with fluid restrictions when not NPO. Renavite. 7.    Ischemic CM, CAD s/p LVAD and BiV - ICD - per primary 8.    Chronic anticoagulation w/LVAD device - coumadin -heparin bridge per primary.   LinJen MowA-C CarKentuckydney [name] Pager: [phone]/2019, 11:35 AM   Patient seen and  examined, agree with above note with above modifications. Pt with no c/o's- just had to be admitted for heparin bridge for declot.  Plan for HD later today at bedside (is routine for LVADS)  KelCorliss ParishD 12/11/2017

## 2017-12-11 NOTE — Consult Note (Signed)
Chief Complaint: Patient was seen in consultation today for tunneled central catheter placement at the request of Dr Haroldine Laws   Supervising Physician: Aletta Edouard  Patient Status: [facility] - In-pt  History of Present Illness: [name] is a 52 y.o. male   ESRD  LIGATION OF BRACHIO-BASILIC VEIN TRANSPOSITION RIGHT ARM (Right) INSERTION OF ARTERIOVENOUS (AV) GRAFT WITH ARTEGRAFT RIGHT UPPER ARM (Right) --- performed in OR with Dr Bridgett Larsson 10/31/17 Clotted graft-- for thrombectomy with Dr Bridgett Larsson 12/12/17  Has no IV access Requesting tunneled central catheter per Dr Bridgett Larsson and Dr Haroldine Laws Has Left ICD Has Right IJ temporary dialysis catheter   Past Medical History:  Diagnosis Date  . Angina   . ASCVD (arteriosclerotic cardiovascular disease)    , Anterior infarction 2005, LAD diagonal bifurcation intervention 03/2004  . Automatic implantable cardiac defibrillator -St. Jude's       . Benign neoplasm of colon   . CHF (congestive heart failure) ([place])   . Chronic systolic heart failure ([place])   . Coronary artery disease     Widely patent previously placed stents in the left anterior   . Crohn's disease ([place])   . 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 Dialysis patient ([place])   . Dyspnea   . Gastroparesis   . GERD (gastroesophageal reflux disease)   . High cholesterol   . Hyperlipidemia   . Hypersomnolent    Previous diagnosis of narcolepsy  . Hypertension, essential   . Ischemic cardiomyopathy    Ejection fraction 15-20% catheterization 2010  . Type II or unspecified type diabetes mellitus without mention of complication, not stated as uncontrolled   . Unspecified gastritis and gastroduodenitis without mention of hemorrhage     Past Surgical History:  Procedure Laterality Date  . APPENDECTOMY    . AV FISTULA PLACEMENT Right 06/26/2017   Procedure: ARTERIOVENOUS (AV) FISTULA CREATION VERSUS GRAFT RIGHT  ARM;  Surgeon: Conrad [place], MD;  Location:  [facility] OR;  Service: Vascular;  Laterality: Right;  . AV FISTULA PLACEMENT Right 10/31/2017   Procedure: INSERTION OF ARTERIOVENOUS (AV) ARTEGRAFT,  RIGHT UPPER ARM;  Surgeon: Conrad Solen, MD;  Location: [place];  Service: Vascular;  Laterality: Right;  . CARDIAC DEFIBRILLATOR PLACEMENT  2010   St. Jude ICD  . COLECTOMY  ~ 2003   "for Crohn's" ascending colon.    . CORONARY STENT INTERVENTION N/A 11/25/2016   Procedure: Coronary Stent Intervention;  Surgeon: Leonie Man, MD;  Location: [place] CV LAB;  Service: Cardiovascular;  Laterality: N/A;  . EP IMPLANTABLE DEVICE N/A 09/14/2016   Procedure: ICD Generator Changeout;  Surgeon: Deboraha Sprang, MD;  Location: [place] CV LAB;  Service: Cardiovascular;  Laterality: N/A;  . ESOPHAGOGASTRODUODENOSCOPY N/A 07/12/2017   Procedure: ESOPHAGOGASTRODUODENOSCOPY (EGD);  Surgeon: Jerene Bears, MD;  Location: [facility] ENDOSCOPY;  Service: Gastroenterology;  Laterality: N/A;  VAD pt so VAD team will need to accompany pt.   [name] FETAL SURGERY FOR CONGENITAL HERNIA     ????? pt has no knowledge of this..    . INGUINAL HERNIA REPAIR    . INSERTION OF DIALYSIS CATHETER Right 06/26/2017   Procedure: INSERTION OF TUNNELED  DIALYSIS CATHETER RIGHT INTERNAL JUGULAR;  Surgeon: Conrad [place], MD;  Location: [place];  Service: Vascular;  Laterality: Right;  . INSERTION OF IMPLANTABLE LEFT VENTRICULAR ASSIST DEVICE N/A 06/12/2017   Procedure: INSERTION OF IMPLANTABLE LEFT VENTRICULAR ASSIST [place] 3;  Surgeon: Ivin Poot, MD;  Location: [facility] OR;  Service: Open Heart Surgery;  Laterality: N/A;  HM3 LVAD  CIRC ARREST  NITRIC OXIDE  . IR THORACENTESIS ASP PLEURAL SPACE W/IMG GUIDE  06/28/2017  . LIGATION OF ARTERIOVENOUS  FISTULA Right 10/31/2017   Procedure: LIGATION OF BRACHIO-BASILIC VEIN TRANSPOSITION RIGHT ARM;  Surgeon: Conrad [place], MD;  Location: [place];  Service: Vascular;  Laterality: Right;  . MULTIPLE EXTRACTIONS WITH ALVEOLOPLASTY N/A 06/08/2017    Procedure: MULTIPLE EXTRACTION WITH ALVEOLOPLASTY AND PRE PROSTHETIC SURGERY AS NEEDED;  Surgeon: Lenn Cal, DDS;  Location: [place];  Service: Oral Surgery;  Laterality: N/A;  . RIGHT HEART CATH N/A 06/07/2017   Procedure: RIGHT HEART CATH;  Surgeon: Larey Dresser, MD;  Location: [place] CV LAB;  Service: Cardiovascular;  Laterality: N/A;  . RIGHT/LEFT HEART CATH AND CORONARY ANGIOGRAPHY N/A 11/23/2016   Procedure: Right/Left Heart Cath and Coronary Angiography;  Surgeon: Larey Dresser, MD;  Location: [place] CV LAB;  Service: Cardiovascular;  Laterality: N/A;  . TEE WITHOUT CARDIOVERSION N/A 06/12/2017   Procedure: TRANSESOPHAGEAL ECHOCARDIOGRAM (TEE);  Surgeon: Prescott Gum, Collier Salina, MD;  Location: [place];  Service: Open Heart Surgery;  Laterality: N/A;    Allergies: Metformin and related  Medications: Prior to Admission medications   Medication Sig Start Date End Date Taking? Authorizing Provider  amLODipine (NORVASC) 5 MG tablet Take 1 tablet (5 mg total) by mouth daily. On non dialysis days. 09/17/17  Yes Daune Perch, NP  aspirin EC 81 MG tablet Take 1 tablet (81 mg total) by mouth daily. 07/26/17  Yes Larey Dresser, MD  atorvastatin (LIPITOR) 40 MG tablet Take 1 tablet (40 mg total) daily at 6 PM by mouth. 07/16/17  Yes Bhagat, Bhavinkumar, PA  docusate sodium (COLACE) 100 MG capsule Take 2 capsules (200 mg total) by mouth daily. 07/03/17  Yes Clegg, Amy D, NP  dronabinol (MARINOL) 2.5 MG capsule Take 1 capsule (2.5 mg total) by mouth 2 (two) times daily before lunch and supper. 09/16/17  Yes Daune Perch, NP  hydrALAZINE (APRESOLINE) 25 MG tablet Take 1.5 tablets (37.5 mg total) by mouth 3 (three) times daily. Take on non-hd days 10/24/17  Yes Larey Dresser, MD  insulin aspart (NOVOLOG) 100 UNIT/ML injection Inject 3 Units into the skin 3 (three) times daily with meals. Patient taking differently: Inject 5 Units into the skin 3 (three) times daily with meals.   07/03/17  Yes Clegg, Amy D, NP  insulin glargine (LANTUS) 100 UNIT/ML injection Inject 0.05 mLs (5 Units total) into the skin daily. Patient taking differently: Inject 10 Units into the skin daily.  07/03/17  Yes Clegg, Amy D, NP  LORazepam (ATIVAN) 0.5 MG tablet Take 1 tablet (0.5 mg total) by mouth every 12 (twelve) hours as needed for anxiety or sleep. 08/04/17  Yes Clegg, Amy D, NP  metoCLOPramide (REGLAN) 5 MG tablet Take 1 tablet (5 mg total) by mouth 4 (four) times daily -  before meals and at bedtime. Patient taking differently: Take 10 mg by mouth 3 (three) times daily.  08/04/17  Yes Clegg, Amy D, NP  Nutritional Supplements (FEEDING SUPPLEMENT, NEPRO CARB STEADY,) LIQD Take 237 mLs by mouth 3 (three) times daily with meals. Patient taking differently: Take 237 mLs by mouth daily.  09/16/17  Yes Daune Perch, NP  ondansetron (ZOFRAN ODT) 4 MG disintegrating tablet Take 1 tablet (4 mg total) by mouth every 6 (six) hours as needed for nausea or vomiting. 01/10/17  Yes Benedict, [place]  S, PA-C  pantoprazole (PROTONIX) 40 MG tablet Take 40 mg by mouth daily.   Yes [provider]  promethazine (PHENERGAN) 12.5 MG tablet Take 1 tablet (12.5 mg total) by mouth every 6 (six) hours as needed for nausea or vomiting. Patient taking differently: Take 25 mg by mouth every 6 (six) hours as needed for nausea or vomiting.  09/16/17  Yes Daune Perch, NP  sildenafil (REVATIO) 20 MG tablet Take 1 tablet (20 mg total) by mouth 3 (three) times daily. 07/26/17  Yes Larey Dresser, MD  warfarin (COUMADIN) 5 MG tablet Take 5 mg (1 tablet) daily except 7.5 mg (1 and 1/2 tablets) on Tuesday/Thursday   Yes [provider]  Continuous Blood Gluc Sensor ([place]) MISC Apply 1 reader to the skin every 10 days. Patient not taking: Reported on 12/05/2017 08/17/17   Zenia Resides, MD  glucose blood (FREESTYLE PRECISION NEO TEST) test strip Use as instructed Patient not taking:  Reported on 12/05/2017 08/17/17   Zenia Resides, MD     Family History  Problem Relation Age of Onset  . Colon polyps Mother   . Diabetes Mother   . Heart attack Father   . Diabetes Father   . Stroke Paternal Grandfather   . Colitis Sister   . Heart disease Brother   . Colitis Sister   . Colon cancer Neg Hx     Social History   Socioeconomic History  . Marital status: Married    Spouse name: Not on file  . Number of children: Not on file  . Years of education: Not on file  . Highest education level: Not on file  Occupational History  . Not on file  Social Needs  . Financial resource strain: Not on file  . Food insecurity:    Worry: Not on file    Inability: Not on file  . Transportation needs:    Medical: Not on file    Non-medical: Not on file  Tobacco Use  . Smoking status: Never Smoker  . Smokeless tobacco: Never Used  Substance and Sexual Activity  . Alcohol use: No  . Drug use: No  . Sexual activity: Never  Lifestyle  . Physical activity:    Days per week: Not on file    Minutes per session: Not on file  . Stress: Not on file  Relationships  . Social connections:    Talks on phone: Not on file    Gets together: Not on file    Attends religious service: Not on file    Active member of club or organization: Not on file    Attends meetings of clubs or organizations: Not on file    Relationship status: Not on file  Other Topics Concern  . Not on file  Social History Narrative  . Not on file    Review of Systems: A 12 point ROS discussed and pertinent positives are indicated in the HPI above.  All other systems are negative.  Review of Systems  Constitutional: Positive for fatigue. Negative for fever.  Psychiatric/Behavioral: Negative for behavioral problems and confusion.    Vital Signs: BP (!) 117/105 (BP Location: Left Arm)   Temp 98.4 F (36.9 C)   Physical Exam  Constitutional: He is oriented to person, place, and time.  Cardiovascular:  Normal rate, regular rhythm and normal heart sounds.  Pulmonary/Chest: Effort normal and breath sounds normal.  Abdominal: Soft. Bowel sounds are normal.  Musculoskeletal: Normal range of  motion.  Neurological: He is alert and oriented to person, place, and time.  Skin: Skin is warm and dry.  Psychiatric: He has a normal mood and affect. His behavior is normal. Judgment and thought content normal.  Nursing note and vitals reviewed.   Imaging: No results found.  Labs:  CBC: Recent Labs    11/02/17 0330 11/03/17 0425 11/04/17 0243 11/16/17 0928  WBC 7.7 8.3 7.2 6.8  HGB 8.7* 8.2* 8.4* 11.1*  HCT 28.6* 26.3* 27.3* 36.0*  PLT 188 214 215 211    COAGS: Recent Labs    06/16/17 0200 06/16/17 1554 06/17/17 0414 06/17/17 1700  11/27/17 11/28/17 12/05/17 12/08/17  INR 1.93 2.59 2.59  --    < > 1.9* 1.6 2.8 2.1  APTT 54* 90* 61* 71*  --   --   --   --   --    < > = values in this interval not displayed.    BMP: Recent Labs    11/02/17 0330 11/03/17 0425 11/04/17 0243 11/16/17 0917  NA 135 134* 136 138  K 4.1 4.4 3.8 4.2  CL 99* 98* 99* 101  CO2 26 25 27 24   GLUCOSE 110* 157* 151* 181*  BUN 22* 39* 17 24*  CALCIUM 8.3* 8.1* 8.2* 8.8*  CREATININE 4.58* 6.89* 4.12* 4.37*  GFRNONAA 13* 8* 15* 14*  GFRAA 15* 9* 18* 16*    LIVER FUNCTION TESTS: Recent Labs    07/14/17 0327  07/31/17 0035 09/22/17 1053 10/29/17 1400  11/01/17 0400 11/02/17 0330 11/03/17 0425 11/04/17 0243  BILITOT 0.7  --  0.8 0.6 0.4  --   --   --   --   --   AST 22  --  16 17 33  --   --   --   --   --   ALT 15*  --  15* 10* 35  --   --   --   --   --   ALKPHOS 126  --  130* 150* 101  --   --   --   --   --   PROT 6.9  --  7.0 7.3 6.6  --   --   --   --   --   ALBUMIN 2.5*   < > 2.8* 3.0* 3.0*   < > 2.6* 2.5* 2.4* 2.4*   < > = values in this interval not displayed.    TUMOR MARKERS: No results for input(s): AFPTM, CEA, CA199, CHROMGRNA in the last 8760 hours.  Assessment and  Plan:  Needs access for OR planned in am For dialysis graft thrombectomy with Dr Bridgett Larsson Scheduled now for tunneled central catheter placement Pt is aware of risks and benefits of procedure-- including but not limited to Infection; bleeding; vessel damage Agreeable to proceed Consent signed and in chart  Thank you for this interesting consult.  I greatly enjoyed meeting Ervan E Musgrave and look forward to participating in their care.  A copy of this report was sent to the requesting provider on this date.  Electronically Signed: Lavonia Drafts, PA-C 12/11/2017, 10:33 AM   I spent a total of 20 Minutes    in face to face in clinical consultation, greater than 50% of which was counseling/coordinating care for tunneled central catheterplacement

## 2017-12-11 NOTE — Procedures (Signed)
Central Venous Catheter Insertion Procedure Note [name] [id number] June 22, 1966  Procedure: Insertion of Central Venous Catheter Indications: Drug and/or fluid administration, Frequent blood sampling and poor venous access  Procedure Details Consent: Risks of procedure as well as the alternatives and risks of each were explained to the (patient/caregiver).  Consent for procedure obtained. Time Out: Verified patient identification, verified procedure, site/side was marked, verified correct patient position, special equipment/implants available, medications/allergies/relevent history reviewed, required imaging and test results available.  Performed  Maximum sterile technique was used including antiseptics, cap, gloves, gown, hand hygiene, mask and sheet. Skin prep: Chlorhexidine; local anesthetic administered A antimicrobial bonded/coated triple lumen catheter was placed in the left internal jugular vein using the Seldinger technique to 20 cm.  Line sutured.  Biopatch  and sterile dressing placed.  Evaluation Blood flow good Complications: No apparent complications Patient did tolerate procedure well. Chest X-ray ordered to verify placement.  CXR: pending   Procedure performed with ultrasound guidance for real time vessel cannulation.      [name], AGACNP-BC [facility] Pgr: [phone] or if no answer [phone] 12/11/2017, 5:09 PM

## 2017-12-12 ENCOUNTER — Other Ambulatory Visit: Payer: Self-pay

## 2017-12-12 ENCOUNTER — Encounter (HOSPITAL_COMMUNITY): Payer: Self-pay | Admitting: Certified Registered"

## 2017-12-12 ENCOUNTER — Inpatient Hospital Stay (HOSPITAL_COMMUNITY): Payer: BLUE CROSS/BLUE SHIELD | Admitting: Certified Registered"

## 2017-12-12 ENCOUNTER — Encounter (HOSPITAL_COMMUNITY)
Admission: RE | Disposition: A | Discharge status: Home / Self Care | Payer: Self-pay | Source: Ambulatory Visit | Attending: Cardiology

## 2017-12-12 DIAGNOSIS — T82868A Thrombosis of vascular prosthetic devices, implants and grafts, initial encounter: Secondary | ICD-10-CM

## 2017-12-12 DIAGNOSIS — Z992 Dependence on renal dialysis: Secondary | ICD-10-CM

## 2017-12-12 HISTORY — PX: THROMBECTOMY AND REVISION OF ARTERIOVENTOUS (AV) GORETEX  GRAFT: SHX6120

## 2017-12-12 LAB — GLUCOSE, CAPILLARY
GLUCOSE-CAPILLARY: 114 mg/dL — AB (ref 65–99)
Glucose-Capillary: 184 mg/dL — ABNORMAL HIGH (ref 65–99)
Glucose-Capillary: 142 mg/dL — ABNORMAL HIGH (ref 65–99)
Glucose-Capillary: 123 mg/dL — ABNORMAL HIGH (ref 65–99)
Glucose-Capillary: 131 mg/dL — ABNORMAL HIGH (ref 65–99)

## 2017-12-12 LAB — BASIC METABOLIC PANEL
Anion gap: 9 (ref 5–15)
BUN: 22 mg/dL — ABNORMAL HIGH (ref 6–20)
CHLORIDE: 103 mmol/L (ref 101–111)
CO2: 26 mmol/L (ref 22–32)
Calcium: 8.5 mg/dL — ABNORMAL LOW (ref 8.9–10.3)
Creatinine, Ser: 4.46 mg/dL — ABNORMAL HIGH (ref 0.61–1.24)
GFR calc non Af Amer: 14 mL/min — ABNORMAL LOW (ref >60)
GFR, EST AFRICAN AMERICAN: 16 mL/min — AB (ref >60)
Glucose, Bld: 161 mg/dL — ABNORMAL HIGH (ref 65–99)
Potassium: 4.1 mmol/L (ref 3.5–5.1)
SODIUM: 138 mmol/L (ref 135–145)

## 2017-12-12 LAB — CBC
HEMATOCRIT: 35.1 % — AB (ref 39.0–52.0)
HEMOGLOBIN: 11.8 g/dL — AB (ref 13.0–17.0)
MCH: 30 pg (ref 26.0–34.0)
MCHC: 33.6 g/dL (ref 30.0–36.0)
MCV: 89.3 fL (ref 78.0–100.0)
Platelets: 196 10*3/uL (ref 150–400)
RBC: 3.93 MIL/uL — AB (ref 4.22–5.81)
RDW: 17.2 % — ABNORMAL HIGH (ref 11.5–15.5)
WBC: 6.9 10*3/uL (ref 4.0–10.5)

## 2017-12-12 LAB — SURGICAL PCR SCREEN
MRSA, PCR: NEGATIVE
Staphylococcus aureus: NEGATIVE

## 2017-12-12 LAB — PROTIME-INR
INR: 1.26
PROTHROMBIN TIME: 15.7 s — AB (ref 11.4–15.2)

## 2017-12-12 LAB — HEPARIN LEVEL (UNFRACTIONATED)
HEPARIN UNFRACTIONATED: 0.18 [IU]/mL — AB (ref 0.30–0.70)
Heparin Unfractionated: 0.33 IU/mL (ref 0.30–0.70)

## 2017-12-12 LAB — LACTATE DEHYDROGENASE: LDH: 187 U/L (ref 98–192)

## 2017-12-12 SURGERY — THROMBECTOMY AND REVISION OF ARTERIOVENTOUS (AV) GORETEX  GRAFT
Anesthesia: Monitor Anesthesia Care | Site: Arm Upper | Laterality: Right

## 2017-12-12 MED ORDER — ONDANSETRON HCL 4 MG/2ML IJ SOLN
INTRAMUSCULAR | Status: DC | PRN
  Administered 2017-12-12: 4 mg via INTRAVENOUS

## 2017-12-12 MED ORDER — FENTANYL CITRATE (PF) 250 MCG/5ML IJ SOLN
INTRAMUSCULAR | Status: AC
  Filled 2017-12-12: qty 5

## 2017-12-12 MED ORDER — LIDOCAINE HCL (PF) 1 % IJ SOLN
INTRAMUSCULAR | Status: AC
  Filled 2017-12-12: qty 30

## 2017-12-12 MED ORDER — PROMETHAZINE HCL 25 MG/ML IJ SOLN
25.0000 mg | Freq: Once | INTRAMUSCULAR | Status: AC
  Administered 2017-12-12: 25 mg via INTRAVENOUS
  Filled 2017-12-12: qty 1

## 2017-12-12 MED ORDER — PROMETHAZINE HCL 25 MG PO TABS
25.0000 mg | ORAL_TABLET | Freq: Four times a day (QID) | ORAL | Status: DC | PRN
Start: 2017-12-12 — End: 2017-12-18

## 2017-12-12 MED ORDER — FENTANYL CITRATE (PF) 100 MCG/2ML IJ SOLN
INTRAMUSCULAR | Status: DC | PRN
  Administered 2017-12-12 (×4): 25 ug via INTRAVENOUS

## 2017-12-12 MED ORDER — PROMETHAZINE HCL 25 MG/ML IJ SOLN: 25.0000 mg | Freq: Four times a day (QID) | INTRAMUSCULAR | Status: DC | PRN

## 2017-12-12 MED ORDER — SODIUM CHLORIDE 0.9 % IV SOLN
INTRAVENOUS | Status: AC
  Filled 2017-12-12: qty 1.2

## 2017-12-12 MED ORDER — ONDANSETRON HCL 4 MG/2ML IJ SOLN: 4.0000 mg | Freq: Once | INTRAMUSCULAR | Status: DC | PRN

## 2017-12-12 MED ORDER — HEMOSTATIC AGENTS (NO CHARGE) OPTIME
TOPICAL | Status: DC | PRN
  Administered 2017-12-12: 1 via TOPICAL

## 2017-12-12 MED ORDER — INSULIN GLARGINE 100 UNIT/ML ~~LOC~~ SOLN
5.0000 [IU] | Freq: Every day | SUBCUTANEOUS | Status: DC
  Administered 2017-12-12 – 2017-12-13 (×2): 5 [IU] via SUBCUTANEOUS
  Filled 2017-12-12 (×2): qty 0.05

## 2017-12-12 MED ORDER — CALCITRIOL 0.25 MCG PO CAPS
0.5000 ug | ORAL_CAPSULE | ORAL | Status: DC
  Administered 2017-12-13 – 2017-12-18 (×3): 0.5 ug via ORAL
  Filled 2017-12-12 (×3): qty 2

## 2017-12-12 MED ORDER — MIDAZOLAM HCL 2 MG/2ML IJ SOLN
INTRAMUSCULAR | Status: AC
  Filled 2017-12-12: qty 2

## 2017-12-12 MED ORDER — MIDAZOLAM HCL 5 MG/5ML IJ SOLN
INTRAMUSCULAR | Status: DC | PRN
  Administered 2017-12-12: 1 mg via INTRAVENOUS

## 2017-12-12 MED ORDER — ONDANSETRON HCL 4 MG/2ML IJ SOLN
INTRAMUSCULAR | Status: AC
  Filled 2017-12-12: qty 2

## 2017-12-12 MED ORDER — SODIUM CHLORIDE 0.9% FLUSH: 10.0000 mL | INTRAVENOUS | Status: DC | PRN

## 2017-12-12 MED ORDER — FENTANYL CITRATE (PF) 100 MCG/2ML IJ SOLN: 25.0000 ug | INTRAMUSCULAR | Status: DC | PRN

## 2017-12-12 MED ORDER — SODIUM CHLORIDE 0.9 % IV SOLN
INTRAVENOUS | Status: DC
  Administered 2017-12-12: 12:00:00 via INTRAVENOUS

## 2017-12-12 MED ORDER — LIDOCAINE HCL (PF) 1 % IJ SOLN
INTRAMUSCULAR | Status: DC | PRN
  Administered 2017-12-12: 5 mL

## 2017-12-12 MED ORDER — OXYCODONE HCL 5 MG PO TABS: 5.0000 mg | ORAL_TABLET | Freq: Once | ORAL | Status: DC | PRN

## 2017-12-12 MED ORDER — SODIUM CHLORIDE 0.9 % IV SOLN
INTRAVENOUS | Status: DC | PRN
  Administered 2017-12-12: 500 mL

## 2017-12-12 MED ORDER — 0.9 % SODIUM CHLORIDE (POUR BTL) OPTIME
TOPICAL | Status: DC | PRN
  Administered 2017-12-12: 1000 mL

## 2017-12-12 MED ORDER — OXYCODONE HCL 5 MG/5ML PO SOLN: 5.0000 mg | Freq: Once | ORAL | Status: DC | PRN

## 2017-12-12 SURGICAL SUPPLY — 43 items
ADH SKN CLS APL DERMABOND .7 (GAUZE/BANDAGES/DRESSINGS) ×2
AGENT HMST SPONGE THK3/8 (HEMOSTASIS) ×2
ARMBAND PINK RESTRICT EXTREMIT (MISCELLANEOUS) ×4 IMPLANT
CANISTER SUCT 3000ML PPV (MISCELLANEOUS) ×3 IMPLANT
CATH EMB 3FR 40CM (CATHETERS) ×2 IMPLANT
CATH EMB 3FR 80CM (CATHETERS) ×2 IMPLANT
CATH EMB 4FR 80CM (CATHETERS) ×5 IMPLANT
CLIP VESOCCLUDE MED 6/CT (CLIP) ×3 IMPLANT
CLIP VESOCCLUDE SM WIDE 6/CT (CLIP) ×3 IMPLANT
COVER PROBE W GEL 5X96 (DRAPES) ×1 IMPLANT
DECANTER SPIKE VIAL GLASS SM (MISCELLANEOUS) ×3 IMPLANT
DERMABOND ADVANCED (GAUZE/BANDAGES/DRESSINGS) ×1
DERMABOND ADVANCED .7 DNX12 (GAUZE/BANDAGES/DRESSINGS) ×2 IMPLANT
DRSG TEGADERM 4X4.75 (GAUZE/BANDAGES/DRESSINGS) ×2 IMPLANT
ELECT REM PT RETURN 9FT ADLT (ELECTROSURGICAL) ×3
ELECTRODE REM PT RTRN 9FT ADLT (ELECTROSURGICAL) ×2 IMPLANT
GAUZE SPONGE 2X2 8PLY STRL LF (GAUZE/BANDAGES/DRESSINGS) ×1 IMPLANT
GLOVE BIO SURGEON STRL SZ7 (GLOVE) ×3 IMPLANT
GLOVE BIOGEL PI IND STRL 7.5 (GLOVE) ×2 IMPLANT
GLOVE BIOGEL PI INDICATOR 7.5 (GLOVE) ×1
GOWN STRL REUS W/ TWL LRG LVL3 (GOWN DISPOSABLE) ×7 IMPLANT
GOWN STRL REUS W/TWL LRG LVL3 (GOWN DISPOSABLE) ×12
HEMOSTAT SPONGE AVITENE ULTRA (HEMOSTASIS) ×2 IMPLANT
KIT BASIN OR (CUSTOM PROCEDURE TRAY) ×3 IMPLANT
KIT TURNOVER KIT B (KITS) ×3 IMPLANT
LOOP VESSEL MAXI BLUE (MISCELLANEOUS) ×2 IMPLANT
NS IRRIG 1000ML POUR BTL (IV SOLUTION) ×3 IMPLANT
PACK CV ACCESS (CUSTOM PROCEDURE TRAY) ×3 IMPLANT
PAD ARMBOARD 7.5X6 YLW CONV (MISCELLANEOUS) ×6 IMPLANT
SPONGE GAUZE 2X2 STER 10/PKG (GAUZE/BANDAGES/DRESSINGS) ×1
SUT ETHILON 3 0 PS 1 (SUTURE) ×2 IMPLANT
SUT GORETEX 6.0 TT13 (SUTURE) IMPLANT
SUT MNCRL AB 4-0 PS2 18 (SUTURE) ×3 IMPLANT
SUT PROLENE 6 0 BV (SUTURE) ×3 IMPLANT
SUT PROLENE 7 0 BV 1 (SUTURE) IMPLANT
SUT SILK 2 0 PERMA HAND 18 BK (SUTURE) IMPLANT
SUT VIC AB 3-0 SH 27 (SUTURE) ×3
SUT VIC AB 3-0 SH 27X BRD (SUTURE) ×3 IMPLANT
SYR TOOMEY 50ML (SYRINGE) IMPLANT
SYSTEM CHEST DRAIN TLS 7FR (DRAIN) ×2 IMPLANT
TOWEL GREEN STERILE (TOWEL DISPOSABLE) ×3 IMPLANT
UNDERPAD 30X30 (UNDERPADS AND DIAPERS) ×3 IMPLANT
WATER STERILE IRR 1000ML POUR (IV SOLUTION) ×3 IMPLANT

## 2017-12-12 NOTE — Progress Notes (Addendum)
Advanced Heart Failure VAD Team Note  PCP-Cardiologist: No primary care provider on file.   Subjective:    Left CVL placed yesterday.  INR 1.26 this am. Labs stable.   Feeling OK. Denies SOB, edema, or N/V. No CP or palpitations. No lightheadedness.   LVAD INTERROGATION:  HeartMate II LVAD:  Flow 4.3 liters/min, speed 5500, power 4.0, PI 3.6.  3 PI events.   Objective:    Vital Signs:   Temp:  [98 F (36.7 C)-98.7 F (37.1 C)] 98 F (36.7 C) (04/09 0331) Pulse Rate:  [37-99] 63 (04/09 0331) Resp:  [16-32] 26 (04/09 0331) BP: (88-127)/(66-105) 93/81 (04/09 0331) SpO2:  [86 %-99 %] 97 % (04/09 0331) Weight:  [175 lb 0.7 oz (79.4 kg)-182 lb 1.6 oz (82.6 kg)] 175 lb 9.6 oz (79.7 kg) (04/09 0645) Last BM Date: 12/11/17 Mean arterial Pressure 70-80s  Intake/Output:   Intake/Output Summary (Last 24 hours) at 12/12/2017 0745 Last data filed at 12/12/2017 0000 Gross per 24 hour  Intake 407.17 ml  Output 2000 ml  Net -1592.83 ml     Physical Exam    General:  Well appearing. No resp difficulty HEENT: Normal Neck: Supple. JVP 8-9 cm. Carotids 2+ bilat; no bruits. No lymphadenopathy or thyromegaly appreciated. Cor: Mechanical heart sounds with LVAD hum present. Lungs: Clear Abdomen: Soft, nontender, nondistended. No hepatosplenomegaly. No bruits or masses. Good bowel sounds. Driveline: C/D/I; securement device intact and driveline incorporated Extremities: No cyanosis, clubbing, rash, edema Neuro: Alert & orientedx3, cranial nerves grossly intact. moves all 4 extremities w/o difficulty. Affect pleasant  Telemetry   NSR, personally reviewed.  EKG    No new tracings.    Labs   Basic Metabolic Panel: Recent Labs  Lab 12/11/17 2008 12/12/17 0600  NA 134* 138  K 4.2 4.1  CL 104 103  CO2 20* 26  GLUCOSE 221* 161*  BUN 64* 22*  CREATININE 8.85* 4.46*  CALCIUM 8.2* 8.5*  PHOS 5.2*  --     Liver Function Tests: Recent Labs  Lab 12/11/17 2008  ALBUMIN 3.0*    No results for input(s): LIPASE, AMYLASE in the last 168 hours. No results for input(s): AMMONIA in the last 168 hours.  CBC: Recent Labs  Lab 12/11/17 2008 12/12/17 0600  WBC 8.1 6.9  HGB 10.6* 11.8*  HCT 34.1* 35.1*  MCV 88.8 89.3  PLT 211 196    INR: Recent Labs  Lab 12/08/17 12/11/17 1020 12/12/17 0600  INR 2.1 1.24 1.26    Other results:  EKG:    Imaging   Dg Chest Port 1 View  Result Date: 12/11/2017 CLINICAL DATA:  Status post PICC line placement. EXAM: PORTABLE CHEST 1 VIEW COMPARISON:  Radiograph of October 29, 2017. FINDINGS: Stable cardiomegaly. Left-sided pacemaker is unchanged in position. Left ventricular assist device is unchanged. Right internal jugular dialysis catheter is noted with tip in right atrium. Left internal jugular catheter is noted with tip in expected position of the SVC. No pneumothorax is noted. No acute pulmonary disease is noted. Bony thorax is unremarkable. IMPRESSION: Stable position of left ventricular assist device and other support apparatus. No significant change compared to prior exam. Electronically Signed   By: Marijo Conception, M.D.   On: 12/11/2017 17:48      Medications:     Scheduled Medications: . amLODipine  5 mg Oral Once per day on Sun Tue Thu Sat  . aspirin EC  81 mg Oral Daily  . atorvastatin  40 mg Oral q1800  .  docusate sodium  200 mg Oral Daily  . dronabinol  2.5 mg Oral BID AC  . feeding supplement (NEPRO CARB STEADY)  237 mL Oral Daily  . hydrALAZINE  37.5 mg Oral 3 times per day on Sun Tue Thu Sat  . insulin aspart  0-5 Units Subcutaneous QHS  . insulin aspart  0-9 Units Subcutaneous TID WC  . insulin aspart  3 Units Subcutaneous TID WC  . metoCLOPramide  10 mg Oral TID  . metoCLOPramide  5 mg Oral TID AC  . mupirocin ointment  1 application Nasal BID  . pantoprazole  40 mg Oral Daily  . sildenafil  20 mg Oral TID     Infusions: . sodium chloride    . sodium chloride    . sodium chloride    .   ceFAZolin (ANCEF) IV    . heparin 1,200 Units/hr (12/12/17 0330)     PRN Medications:  sodium chloride, sodium chloride, acetaminophen, alteplase, heparin, lidocaine (PF), lidocaine-prilocaine, LORazepam, ondansetron (ZOFRAN) IV, ondansetron, pentafluoroprop-tetrafluoroeth, promethazine   Patient Profile   [name] is a 52 y.o. 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   Planned admission for heparin bridge for R AVF repair 12/12/17.  Assessment/Plan:    1. ESRD  - Post LVAD placement  - RUE fistula occluded.  - Plan for AVF repair this afternoon.  - Coumadin resumption per vascular. (May be able to resume low dose tonight)  2. Chronic systolic CHF: Ischemic cardiomyopathy. St Jude ICD. NSTEMI in March 2018 with DES to LAD and RCA, complicated by cardiogenic shock, low output requiring milrinone. Echo 8/18 with EF 20-25%, moderate MR. PX (8/18) with severe functional impairment due to HF. He is now s/p HM3 LVAD placement. LVAD parameters reviewed today and stable, fewer PI events at HD now with lower speed. No complaints. MAP 103 but has not taken BP-active meds today yet. Hemoglobin stable. NYHA II. Volume status stable on exam.  - Continue amlodipine 5 mg daily on non HD days.  - Continue ASA 81. Coumadin on hold for OR today.  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 Stable.  -Continue hydralazine37.5 mg tid on NON-HD days.  -He has been referredto [facility] for evaluation for heart/kidney transplant.   3. CAD: NSTEMI in 3/18. LHC with 99% ulcerated lesion proximal RCA with left to right collaterals, 95% mid LAD stenosis after mid LAD stent. s/p PCI to RCA and LAD on 11/25/16.  - Continue statin and ASA. - No s/s of ischemia.     4. h/o DVTs: Factor V Leiden heterozygote. - Coumadin on hold for  OR today.  5. Diabetic gastroparesis: Difficult to manage.  - Symptoms have been stable recently.  - Uses Phenergan suppositories as needed.  - Has seen gastroparesis specialist at [facility] and workup is continuing. . - Continue reglan 5 mg TID/AC  6. DM:  - SSI while in house. Discussed dosing with Pharm-D personally.   7. HTN:  - Stable on meds as above.   8. PAD: - Stable. bilateral disease on ABIs in 8/18. Denies claudication currently, no pedal ulcers.   9. Atrial flutter: Paroxysmal.  - NSR on tele.    I reviewed the LVAD parameters from today, and compared the results to the patient's prior recorded data.  No programming changes  were made.  The LVAD is functioning within specified parameters.  The patient performs LVAD self-test daily.  LVAD interrogation was negative for any significant power changes, alarms or PI events/speed drops.  LVAD equipment check completed and is in good working order.  Back-up equipment present.   LVAD education done on emergency procedures and precautions and reviewed exit site care.  Length of Stay: 1  Annamaria Helling 12/12/2017, 7:45 AM  VAD Team --- VAD ISSUES ONLY--- Pager [phone] (7am - 7am)  Advanced Heart Failure Team  Pager [phone] (M-F; 7a - 4p)  Please contact [facility] for night-coverage after hours (4p -7a ) and weekends on [email or web address]  Patient seen with PA, agree with the above note.  He is stable today, INR low but on heparin gtt.  Plan for vascular procedure today.  He has had no abdominal pain, overall feels ok.  Restart warfarin with heparin bridge post-op when ok with surgery.   Collyn Ribas 12/12/2017 12:55 PM

## 2017-12-12 NOTE — Progress Notes (Signed)
Pt procedure site has bled through dressing 3 times in 3 hours. MD on call paged. Will continue to monitor.

## 2017-12-12 NOTE — Anesthesia Preprocedure Evaluation (Signed)
Anesthesia Evaluation  Patient identified by MRN, date of birth, ID band Patient awake    Reviewed: Allergy & Precautions, NPO status , Patient's Chart, lab work & pertinent test results  Airway Mallampati: II  TM Distance: >3 FB Neck ROM: Full    Dental  (+) Edentulous Upper, Edentulous Lower   Pulmonary    breath sounds clear to auscultation       Cardiovascular hypertension,  Rhythm:Regular Rate:Normal  Mechanical hum from LVAD   Neuro/Psych    GI/Hepatic   Endo/Other  diabetes  Renal/GU      Musculoskeletal   Abdominal   Peds  Hematology   Anesthesia Other Findings   Reproductive/Obstetrics                             Anesthesia Physical Anesthesia Plan  ASA: III  Anesthesia Plan: MAC   Post-op Pain Management:    Induction: Intravenous  PONV Risk Score and Plan: Ondansetron and Dexamethasone  Airway Management Planned: Natural Airway and Simple Face Mask  Additional Equipment:   Intra-op Plan:   Post-operative Plan:   Informed Consent: I have reviewed the patients History and Physical, chart, labs and discussed the procedure including the risks, benefits and alternatives for the proposed anesthesia with the patient or authorized representative who has indicated his/her understanding and acceptance.     Plan Discussed with: CRNA and Anesthesiologist  Anesthesia Plan Comments:         Anesthesia Quick Evaluation

## 2017-12-12 NOTE — Plan of Care (Signed)
Continue current care plan 

## 2017-12-12 NOTE — Anesthesia Postprocedure Evaluation (Signed)
Anesthesia Post Note  Patient: [place] E Shakoor  Procedure(s) Performed: THROMBECTOMY ARTERIOVENTOUS (AV) GORETEX  GRAFT RIGHT UPPER ARM (Right Arm Upper)     Patient location during evaluation: PACU Anesthesia Type: MAC Level of consciousness: awake and alert Pain management: pain level controlled Vital Signs Assessment: post-procedure vital signs reviewed and stable Respiratory status: spontaneous breathing, nonlabored ventilation, respiratory function stable and patient connected to nasal cannula oxygen Cardiovascular status: stable and blood pressure returned to baseline Postop Assessment: no apparent nausea or vomiting Anesthetic complications: no    Last Vitals:  Vitals:   12/12/17 1607 12/12/17 1935  BP: 98/84 110/89  Pulse: 83 88  Resp: 20 16  Temp: (!) 36.3 C 36.8 C  SpO2: 97% 100%    Last Pain:  Vitals:   12/12/17 1935  TempSrc: Oral  PainSc:                  Lucrezia Dehne COKER

## 2017-12-12 NOTE — Progress Notes (Signed)
Pt accidentally pulled drain now bleeding notified dr Trula Slade. New dressing reapplied, will continue to monitor.

## 2017-12-12 NOTE — Progress Notes (Signed)
Subjective:  Had HD last night- removed 2000- tolerated well.  INR 1.26 this AM so should be due for declot  Objective Vital signs in last 24 hours: Vitals:   12/12/17 0015 12/12/17 0054 12/12/17 0331 12/12/17 0645  BP: (!) 89/75 95/68 93/81    Pulse: 91  63   Resp: 17  (!) 26   Temp: 98.2 F (36.8 C)  98 F (36.7 C)   TempSrc: Oral     SpO2: 98%  97%   Weight: 80.6 kg (177 lb 11.1 oz)   79.7 kg (175 lb 9.6 oz)  Height:       Weight change:   Intake/Output Summary (Last 24 hours) at 12/12/2017 0743 Last data filed at 12/12/2017 0000 Gross per 24 hour  Intake 407.17 ml  Output 2000 ml  Net -1592.83 ml    Dialysis Orders:  MWF - [facility]  4hrs 57mn, BFR 400, DFR 800, 3K/ 2Ca ,  EDW 80kg (includes 7lb LVAD battery which is not used while admitted d/t wall hook up)  Access: L IJ TDC, LU AVF maturing - possibly occluded Heparin 8000 Unit bolus   Venofer 596mq wk - last 4/5 Calcitriol 0.23m66mqHD TIW    Assessment/Plan: 1.  RU AVG occulusion -  per Dr. CheBridgett LarssonUA duplex followed by possible fistulogram w/intervention vs thrombectomy and revision- hopefully today. Admitted today for coumadin-heparin bridge.  2.  ESRD - Continue at bedside while admitted per regular MWF schedule via L IJ TDC. K 4.2. Holding heparin w/HD. Done yest without issue, will next be due tomorrow- will aim for in the AM 3.  Hypertension/volume - BP more normal for pt - actually low- on norvasc 5, hydralazine 37.5 TID on off HD days and revatio - given midodrine last night but that is not typical med for him.  He feels well with BP in the 80's- volume status seems OK 4.  Anemia  -  Hgb over 11 this AM- not on ESA here   5.  Secondary Hyperparathyroidism -  OP Ca and P within goal. Continue VDRA. Not on binders. 6.  Nutrition - Renal diet with fluid restrictions when not NPO. Renavite. 7.    Ischemic CM, CAD s/p LVAD and BiV - ICD - per primary 8.    Chronic anticoagulation w/LVAD device - coumadin  -heparin bridge per primary.     [name] A    Labs: Basic Metabolic Panel: Recent Labs  Lab 12/11/17 2008 12/12/17 0600  NA 134* 138  K 4.2 4.1  CL 104 103  CO2 20* 26  GLUCOSE 221* 161*  BUN 64* 22*  CREATININE 8.85* 4.46*  CALCIUM 8.2* 8.5*  PHOS 5.2*  --    Liver Function Tests: Recent Labs  Lab 12/11/17 2008  ALBUMIN 3.0*   No results for input(s): LIPASE, AMYLASE in the last 168 hours. No results for input(s): AMMONIA in the last 168 hours. CBC: Recent Labs  Lab 12/11/17 2008 12/12/17 0600  WBC 8.1 6.9  HGB 10.6* 11.8*  HCT 34.1* 35.1*  MCV 88.8 89.3  PLT 211 196   Cardiac Enzymes: No results for input(s): CKTOTAL, CKMB, CKMBINDEX, TROPONINI in the last 168 hours. CBG: Recent Labs  Lab 12/11/17 1254 12/11/17 1719 12/11/17 2118  GLUCAP 144* 74 153*    Iron Studies: No results for input(s): IRON, TIBC, TRANSFERRIN, FERRITIN in the last 72 hours. Studies/Results: Dg Chest Port 1 View  Result Date: 12/11/2017 CLINICAL DATA:  Status post PICC line placement.  EXAM: PORTABLE CHEST 1 VIEW COMPARISON:  Radiograph of October 29, 2017. FINDINGS: Stable cardiomegaly. Left-sided pacemaker is unchanged in position. Left ventricular assist device is unchanged. Right internal jugular dialysis catheter is noted with tip in right atrium. Left internal jugular catheter is noted with tip in expected position of the SVC. No pneumothorax is noted. No acute pulmonary disease is noted. Bony thorax is unremarkable. IMPRESSION: Stable position of left ventricular assist device and other support apparatus. No significant change compared to prior exam. Electronically Signed   By: Marijo Conception, M.D.   On: 12/11/2017 17:48   Medications: Infusions: . sodium chloride    . sodium chloride    . sodium chloride    .  ceFAZolin (ANCEF) IV    . heparin 1,200 Units/hr (12/12/17 0330)    Scheduled Medications: . amLODipine  5 mg Oral Once per day on Sun Tue Thu Sat   . aspirin EC  81 mg Oral Daily  . atorvastatin  40 mg Oral q1800  . docusate sodium  200 mg Oral Daily  . dronabinol  2.5 mg Oral BID AC  . feeding supplement (NEPRO CARB STEADY)  237 mL Oral Daily  . hydrALAZINE  37.5 mg Oral 3 times per day on Sun Tue Thu Sat  . insulin aspart  0-5 Units Subcutaneous QHS  . insulin aspart  0-9 Units Subcutaneous TID WC  . insulin aspart  3 Units Subcutaneous TID WC  . metoCLOPramide  10 mg Oral TID  . metoCLOPramide  5 mg Oral TID AC  . mupirocin ointment  1 application Nasal BID  . pantoprazole  40 mg Oral Daily  . sildenafil  20 mg Oral TID    have reviewed scheduled and prn medications.  Physical Exam: General: NAD Heart: RRR Lungs: mostly clear Abdomen: soft, non tender Extremities: no edema Dialysis Access: right sided PC and clotted AVG    12/12/2017,7:43 AM  LOS: 1 day

## 2017-12-12 NOTE — Progress Notes (Signed)
Existing VAD dressing removed and site care performed using sterile technique. Drive line exit site cleaned with Chlora prep applicators x 2, allowed to dry, and Sorbaview dressing with bio patch re-applied. Exit site healed and incorporated, the velour is fully implanted at exit site. No redness, tenderness, drainage, foul odor or rash noted. Drive line anchor re-applied. Will continue weekly dressing changes.

## 2017-12-12 NOTE — Progress Notes (Addendum)
HD tx completed @ 0000 w/o problem, UF goal met, blood rinsed back, VSS w/ soft bp but typical for LVAD pt per primary nurse team, pt was asymptomatic, report given to [place], [place]

## 2017-12-12 NOTE — Progress Notes (Signed)
LVAD Coordinator Rounding Note:  Admitted 12/11/17 to Dr. Aundra Dubin for Heparin bridge for arterial venous graft repair per Dr. Bridgett Larsson.   HM III LVAD implanted on 06/12/17 by Dr. Prescott Gum under Destination Therapy criteria due to renal insufficiency.  Vital signs: Temp: 98.7 HR: 87 Doppler Pressure: not done Automatic BP:  95/68 (77) O2 Sat: 97% on RA Wt: 175 > 175 lbs   LVAD interrogation reveals:  Speed: 5500 Flow: 3.2 Power:  4.1 PI: 8.0 Alarms: none  Events:  rare Hematocrit: 36 Fixed speed: 5500 Low speed limit: 5200  Drive Line: left abdominal exit site sorbaview dressing dry and intact; anchor in place and accurately applied. Weekly dressing changes.   Labs:  LDH trend: 188>187  INR trend: 1.24>1.26  Anticoagulation Plan: -INR Goal: 2.0 - 3.0 -ASA Dose: 81 mg daily  Device: -St Jude dual -Therapies: on at 200 bpm - Monitor: on VT 150 bpm  Adverse Events on VAD: - ESRD post LVAD; started CVVH 06/15/17; HD started 06/20/17 - Hospitalization 11/5 - 07/16/17 > gastroparesis diagnosis after EGD - Hospitalization 11/26 - 08/04/17 > gastroparesis - Hospitalization 1/8 - 09/16/17>gastroparesis - referred to Dr. Corliss Parish at [facility] - 10/31/17>rightupper arm arteriovenous graft with Artegraft; ligation of right first stage brachial vein transposition    Plan/Recommendations:   1. To OR today for AV graft repair. Scheduled at 1:30 pm with Dr. Bridgett Larsson. Pt will need to go down on batteries, but take VAD cart. Page VAD Coordinator to remain with patient in OR.  2. Pt is due for VAD dressing change today; please order weekly dressing kit.   Zada Girt RN, VAD Coordinator 24/7 VAD Pager: [phone]

## 2017-12-12 NOTE — Op Note (Addendum)
OPERATIVE NOTE   PROCEDURE: 1. Simple thrombectomy right upper arm arteriovenous graft   PRE-OPERATIVE DIAGNOSIS: thrombosed right upper arm arteriovenous graft   POST-OPERATIVE DIAGNOSIS: same as above   SURGEON: Adele Barthel, MD  ASSISTANT(S): Dagoberto Ligas, PAC   ANESTHESIA: local and MAC  ESTIMATED BLOOD LOSS: 50 cc  FINDING(S): 1.  No thrombus in axillary vein with no evidence of neointimal hyperplasia 2.  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 male with LVAD who presents with thrombosed right upper arm arteriovenous graft.  I recommended: right upper arm arteriovenous graft revision vs replacement.  Risk, benefits, and alternatives to access surgery were discussed.  The patient is aware the risks include but are not limited to: bleeding, infection, steal syndrome, nerve damage, ischemic monomelic neuropathy, thrombosis, failure to mature, complications related to venous hypertension, need for additional procedures, death and stroke.  The patient agrees to proceed forward with the procedure.   DESCRIPTION: After obtaining full informed written consent, the patient was brought back to the operating room and placed supine upon the operating table.  The patient received IV antibiotics prior to induction.  A procedure time out was completed and the correct surgical site was verified.  After obtaining adequate anesthesia, the patient was prepped and draped in the standard fashion for: right arm access procedure.  I made an incision over the venous anastomosis.  Using electrocautery, I dissected out the Artegraft.  There did not appear to be any obvious infection though the tissue surrounding the Artegraft was not densely scarred.  The axillary vein did not appear to have any thrombus.  I  dissected out the axillary vein proximally and distally and placed vessel loops on the vein.  I dissected out the proximal graft and placed a vessel loop around the graft.  I transected the suture line and carefully pulled out the anterior suture line, leaving the two ends of the 6-0 Prolene intact. There was no thrombus in this anastomosis and there was some limited backbleeding in this vein.  There was some bleeding from the vein proximal and distal to the anastomosis.  I placed the vein under tension proximally and distally.  I passed a 3 Fogarty proximally in the vein and encountered resistance.  This resulted in rupture of the Fogarty balloon.  I also passed a 4 Fogarty proximally.  This also encountered resistance.  No thrombus was retrieved.  I then passed the 4 Fogarty distally in the graft.  I retrieved chronic thrombus in the graft without removing an arterial meniscus.  After switching to a 3 Fogarty, I was able to cross the arterial anastomosis and retrieve the arterial meniscus.  This resulted in return of continuous blood flow with somewhat pulsatile character.  I placed the graft under tension and then repaired the anterior anastomotic line by tying a new 6-0 Prolene stitch to one end of the old suture line.  I then continued the repair of the anterior suture line.  I then tied the new suture the remaining end of the old suture line.  At this point, I interrogated the arteriovenous graft.  There was a signal consistent with continuous blood flow in the graft.  The signal in the axillary vein was continuous with a pulsatile character.  I placed Avitene in the wound.  I packed  the incision with a Raytec.  Due to the need for continued anticoagulation, I felt placement of a drain was necessary.  I passed the TLS trocar through the subcutaneous tissue inferior to the incision line.  I pulled the drain to appropriate position and secured it with a 3-0 Nylon suture tied to the drain.  I shortened the  drain to appropriate length and place it adjacent to the arteriovenous graft.  The exterior hardware was attached to the drain and the test tube was not attached until the skin was close.     The subcutaneous tissue was reapproximated with a 3-0 Vicryl.  The skin was reapproximated with a running subcuticular stitch of 4-0 Monocryl.  The skin was cleaned, dried, and reinforced with Dermabond.   In the event this arteriovenous graft fails, I would NOT attempt to salvage this arteriovenous graft.  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  Additionally, I have concerns that there is inadequate blood flow to support patency of an arteriovenous graft.     COMPLICATIONS: none  CONDITION: stable   Adele Barthel, MD, [facility] [place] Office: [phone] Pager: [phone]  12/12/2017, 2:39 PM

## 2017-12-12 NOTE — Progress Notes (Signed)
ANTICOAGULATION CONSULT NOTE - Follow Up Consult  Pharmacy Consult for heparin Indication: LVAD  Labs: Recent Labs    12/11/17 1020 12/11/17 2008 12/12/17 0225 12/12/17 0600 12/12/17 1130  HGB  --  10.6*  --  11.8*  --   HCT  --  34.1*  --  35.1*  --   PLT  --  211  --  196  --   LABPROT 15.5*  --   --  15.7*  --   INR 1.24  --   --  1.26  --   HEPARINUNFRC  --  1.01* 0.18*  --  0.33  CREATININE  --  8.85*  --  4.46*  --     Assessment: 52yo male subtherapeutic on heparin with initial dosing while Coumadin on hold; patient undergoing RUA AVG thrombectomy revision.  Heparin level this morning was therapeutic at 0.33, on 1200 units/hr. Hgb is 11.8, plts WNL No signs/symptoms of bleeding.    Goal of Therapy:  Heparin level 0.3-0.7 units/ml   Plan:  Continue heparin infusion at 1200 units/hr  Will follow up after procedure for plan if resuming warfarin Monitor daily HL and CBC.    Doylene Canard, PharmD Clinical Pharmacist  Pager: [phone] Clinical Phone for 12/12/2017 until 3:30pm: [phone] If after 3:30pm, please call main pharmacy at [phone] 12/12/2017,3:27 PM

## 2017-12-12 NOTE — Transfer of Care (Signed)
Immediate Anesthesia Transfer of Care Note  Patient: [name]  Procedure(s) Performed: THROMBECTOMY ARTERIOVENTOUS (AV) GORETEX  GRAFT RIGHT UPPER ARM (Right Arm Upper)  Patient Location: PACU  Anesthesia Type:MAC  Level of Consciousness: drowsy and patient cooperative  Airway & Oxygen Therapy: Patient Spontanous Breathing  Post-op Assessment: Report given to RN, Post -op Vital signs reviewed and stable and Patient moving all extremities X 4  Post vital signs: Reviewed and stable  Last Vitals:  Vitals Value Taken Time  BP    Temp    Pulse    Resp    SpO2      Last Pain:  Vitals:   12/12/17 1157  TempSrc: Oral  PainSc: 0-No pain         Complications: No apparent anesthesia complications

## 2017-12-12 NOTE — Progress Notes (Signed)
VAD Coordinator Procedure Note:   Patient underwent thrombectomy for AV graft on RUA. Hemodynamics and VAD parameters monitored by me and CRNA throughout the procedure. MAPs were obtained with an automaticBP cuff on left arm.      Time:  Auto cuff(MAP):  Flow: PI: Power:     Speed:               Pre-procedure:1400  104/90(96)     3.8  4.1    4.0      5500   Sedation Induction:1408  105/91(97)     3.9  4.3    4.0      5500     1415  107/91(98)     3.9  4.2    4.0      5500     1430  107/97(102)     3.8  4.6    4.1      5500     1445  119/89(100)     3.8  4.4    4.0      5500            Interventions: Pt only required conscious sedation during the procedure and did not require any extra fluid or pressors.  Recovery area: 1504  99/82(89)      3.9  3.8     4.0        5500   Patient tolerated the procedure well. After adequate sedation was achieved, pulse ox 97% and maintained >92% throughout the remainder of the procedure. MAPs were 96-102.   Patient Disposition: Pt was transported back to [facility] on batteries from PACU. V/S stable and WNL. VAD parameters normal.  [name] RN, BSN VAD Coordinator 24/7 Pager [phone]

## 2017-12-12 NOTE — Progress Notes (Signed)
ANTICOAGULATION CONSULT NOTE - Follow Up Consult  Pharmacy Consult for heparin Indication: LVAD  Labs: Recent Labs    12/11/17 1020 12/11/17 2008 12/12/17 0225  HGB  --  10.6*  --   HCT  --  34.1*  --   PLT  --  211  --   LABPROT 15.5*  --   --   INR 1.24  --   --   HEPARINUNFRC  --  1.01* 0.18*  CREATININE  --  8.85*  --     Assessment: 52yo male subtherapeutic on heparin with initial dosing while Coumadin on hold; pt to be taken to OR this afternoon.  Goal of Therapy:  Heparin level 0.3-0.7 units/ml   Plan:  Will increase heparin gtt by 2-3 units/kg/hr to 1200 units/hr and check level in 8 hours.    Wynona Neat, PharmD, BCPS  12/12/2017,3:27 AM

## 2017-12-12 NOTE — H&P (Signed)
Brief History and Physical  History of Present Illness   Edger E Brotherton is a 52 y.o. male w/ LVAD who presents with chief complaint: thrombosed RUA AVG.  The patient presents today for RUA AVG thrombectomy and revision.    Past Medical History:  Diagnosis Date  . Angina   . ASCVD (arteriosclerotic cardiovascular disease)    , Anterior infarction 2005, LAD diagonal bifurcation intervention 03/2004  . Automatic implantable cardiac defibrillator -St. Jude's       . Benign neoplasm of colon   . CHF (congestive heart failure) ([place])   . Chronic systolic heart failure ([place])   . Coronary artery disease     Widely patent previously placed stents in the left anterior   . Crohn's disease ([place])   . 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 Dialysis patient ([place])   . Dyspnea   . Gastroparesis   . GERD (gastroesophageal reflux disease)   . High cholesterol   . Hyperlipidemia   . Hypersomnolent    Previous diagnosis of narcolepsy  . Hypertension, essential   . Ischemic cardiomyopathy    Ejection fraction 15-20% catheterization 2010  . Type II or unspecified type diabetes mellitus without mention of complication, not stated as uncontrolled   . Unspecified gastritis and gastroduodenitis without mention of hemorrhage     Past Surgical History:  Procedure Laterality Date  . APPENDECTOMY    . AV FISTULA PLACEMENT Right 06/26/2017   Procedure: ARTERIOVENOUS (AV) FISTULA CREATION VERSUS GRAFT RIGHT  ARM;  Surgeon: Conrad [place], MD;  Location: [facility] OR;  Service: Vascular;  Laterality: Right;  . AV FISTULA PLACEMENT Right 10/31/2017   Procedure: INSERTION OF ARTERIOVENOUS (AV) ARTEGRAFT,  RIGHT UPPER ARM;  Surgeon: Conrad [place], MD;  Location: [place];  Service: Vascular;  Laterality: Right;  . CARDIAC DEFIBRILLATOR PLACEMENT  2010   St. Jude ICD  . COLECTOMY  ~ 2003   "for Crohn's" ascending colon.    . CORONARY STENT INTERVENTION N/A 11/25/2016   Procedure: Coronary  Stent Intervention;  Surgeon: Leonie Man, MD;  Location: [place] CV LAB;  Service: Cardiovascular;  Laterality: N/A;  . EP IMPLANTABLE DEVICE N/A 09/14/2016   Procedure: ICD Generator Changeout;  Surgeon: Deboraha Sprang, MD;  Location: [place] [facility];  Service: Cardiovascular;  Laterality: N/A;  . ESOPHAGOGASTRODUODENOSCOPY N/A 07/12/2017   Procedure: ESOPHAGOGASTRODUODENOSCOPY (EGD);  Surgeon: Jerene Bears, MD;  Location: [facility] ENDOSCOPY;  Service: Gastroenterology;  Laterality: N/A;  VAD pt so VAD team will need to accompany pt.   [name] FETAL SURGERY FOR CONGENITAL HERNIA     ????? pt has no knowledge of this..    . INGUINAL HERNIA REPAIR    . INSERTION OF DIALYSIS CATHETER Right 06/26/2017   Procedure: INSERTION OF TUNNELED  DIALYSIS CATHETER RIGHT INTERNAL JUGULAR;  Surgeon: Conrad Ramsey, MD;  Location: [place];  Service: Vascular;  Laterality: Right;  . INSERTION OF IMPLANTABLE LEFT VENTRICULAR ASSIST DEVICE N/A 06/12/2017   Procedure: INSERTION OF IMPLANTABLE LEFT VENTRICULAR ASSIST [place] 3;  Surgeon: Ivin Poot, MD;  Location: [place];  Service: Open Heart Surgery;  Laterality: N/A;  HM3 LVAD  CIRC ARREST  NITRIC OXIDE  . IR THORACENTESIS ASP PLEURAL SPACE W/IMG GUIDE  06/28/2017  . LIGATION OF ARTERIOVENOUS  FISTULA Right 10/31/2017   Procedure: LIGATION OF BRACHIO-BASILIC VEIN TRANSPOSITION RIGHT ARM;  Surgeon: Conrad [place], MD;  Location: [place];  Service: Vascular;  Laterality:  Right;  [name] MULTIPLE EXTRACTIONS WITH ALVEOLOPLASTY N/A 06/08/2017   Procedure: MULTIPLE EXTRACTION WITH ALVEOLOPLASTY AND PRE PROSTHETIC SURGERY AS NEEDED;  Surgeon: Lenn Cal, DDS;  Location: [place];  Service: Oral Surgery;  Laterality: N/A;  . RIGHT HEART CATH N/A 06/07/2017   Procedure: RIGHT HEART CATH;  Surgeon: Larey Dresser, MD;  Location: [place] CV LAB;  Service: Cardiovascular;  Laterality: N/A;  . RIGHT/LEFT HEART CATH AND CORONARY ANGIOGRAPHY N/A 11/23/2016    Procedure: Right/Left Heart Cath and Coronary Angiography;  Surgeon: Larey Dresser, MD;  Location: [place] CV LAB;  Service: Cardiovascular;  Laterality: N/A;  . TEE WITHOUT CARDIOVERSION N/A 06/12/2017   Procedure: TRANSESOPHAGEAL ECHOCARDIOGRAM (TEE);  Surgeon: Prescott Gum, Collier Salina, MD;  Location: [name];  Service: Open Heart Surgery;  Laterality: N/A;    Social History   Socioeconomic History  . Marital status: Married    Spouse name: Not on file  . Number of children: Not on file  . Years of education: Not on file  . Highest education level: Not on file  Occupational History  . Not on file  Social Needs  . Financial resource strain: Not on file  . Food insecurity:    Worry: Not on file    Inability: Not on file  . Transportation needs:    Medical: Not on file    Non-medical: Not on file  Tobacco Use  . Smoking status: Never Smoker  . Smokeless tobacco: Never Used  Substance and Sexual Activity  . Alcohol use: No  . Drug use: No  . Sexual activity: Never  Lifestyle  . Physical activity:    Days per week: Not on file    Minutes per session: Not on file  . Stress: Not on file  Relationships  . Social connections:    Talks on phone: Not on file    Gets together: Not on file    Attends religious service: Not on file    Active member of club or organization: Not on file    Attends meetings of clubs or organizations: Not on file    Relationship status: Not on file  . Intimate partner violence:    Fear of current or ex partner: Not on file    Emotionally abused: Not on file    Physically abused: Not on file    Forced sexual activity: Not on file  Other Topics Concern  . Not on file  Social History Narrative  . Not on file    Family History  Problem Relation Age of Onset  . Colon polyps Mother   . Diabetes Mother   . Heart attack Father   . Diabetes Father   . Stroke Paternal Grandfather   . Colitis Sister   . Heart disease Brother   . Colitis Sister   .  Colon cancer Neg Hx     Current Facility-Administered Medications  Medication Dose Route Frequency Provider Last Rate Last Dose  . 0.9 %  sodium chloride infusion   Intravenous Continuous Shirley Friar, PA-C      . [MAR Hold] 0.9 %  sodium chloride infusion  100 mL Intravenous PRN Penninger, [name], PA      . [MAR Hold] 0.9 %  sodium chloride infusion  100 mL Intravenous PRN Penninger, [name], PA      . 0.9 %  sodium chloride infusion   Intravenous Continuous Roberts Gaudy, MD 10 mL/hr at 12/12/17 1218    . [MAR Hold] acetaminophen (TYLENOL)  tablet 650 mg  650 mg Oral Q4H PRN Shirley Friar, PA-C      . [MAR Hold] alteplase (CATHFLO ACTIVASE) injection 2 mg  2 mg Intracatheter Once PRN Penninger, [name], PA      . [MAR Hold] amLODipine (NORVASC) tablet 5 mg  5 mg Oral Once per day on Sun Tue Thu Sat Shirley Friar, PA-C      . [MAR Hold] aspirin EC tablet 81 mg  81 mg Oral Daily Shirley Friar, PA-C   81 mg at 12/12/17 0805  . [MAR Hold] atorvastatin (LIPITOR) tablet 40 mg  40 mg Oral q1800 Shirley Friar, PA-C   40 mg at 12/11/17 1723  . [MAR Hold] calcitRIOL (ROCALTROL) capsule 0.5 mcg  0.5 mcg Oral Q M,W,F-HD Corliss Parish, MD      . Doug Sou Hold] ceFAZolin (ANCEF) IVPB 2g/100 mL premix  2 g Intravenous 30 min Pre-Op Shirley Friar, PA-C      . [MAR Hold] docusate sodium (COLACE) capsule 200 mg  200 mg Oral Daily Shirley Friar, PA-C   200 mg at 12/12/17 0805  . [MAR Hold] dronabinol (MARINOL) capsule 2.5 mg  2.5 mg Oral BID AC Shirley Friar, PA-C   2.5 mg at 12/11/17 1724  . [MAR Hold] feeding supplement (NEPRO CARB STEADY) liquid 237 mL  237 mL Oral Daily Shirley Friar, PA-C      . heparin ADULT infusion 100 units/mL (25000 units/220m sodium chloride 0.45%)  1,200 Units/hr Intravenous Continuous BLaren Everts RPH 12 mL/hr at 12/12/17 0330 1,200 Units/hr at 12/12/17 0330  . [MAR Hold] heparin  injection 1,000 Units  1,000 Units Dialysis PRN Penninger, LRia Comment PA   1,000 Units at 12/12/17 0005  . [MAR Hold] hydrALAZINE (APRESOLINE) tablet 37.5 mg  37.5 mg Oral 3 times per day on Sun Tue Thu Sat TShirley Friar PA-C   37.5 mg at 12/12/17 0805  . [MAR Hold] insulin aspart (novoLOG) injection 0-5 Units  0-5 Units Subcutaneous QHS TShirley Friar PA-C      . [MAR Hold] insulin aspart (novoLOG) injection 0-9 Units  0-9 Units Subcutaneous TID WC TShirley Friar PA-C   2 Units at 12/12/17 03299 . [MAR Hold] insulin aspart (novoLOG) injection 3 Units  3 Units Subcutaneous TID WC TShirley Friar PA-C      . [MAR Hold] insulin glargine (LANTUS) injection 5 Units  5 Units Subcutaneous QHS TShirley Friar PA-C      . [MAR Hold] lidocaine (PF) (XYLOCAINE) 1 % injection 5 mL  5 mL Intradermal PRN Penninger, LRia Comment PA      . [Doug SouHold] lidocaine-prilocaine (EMLA) cream 1 application  1 application Topical PRN Penninger, LRia Comment PA      . [Doug SouHold] LORazepam (ATIVAN) tablet 0.5 mg  0.5 mg Oral Q12H PRN TShirley Friar PA-C   0.5 mg at 12/11/17 1623  . [MAR Hold] metoCLOPramide (REGLAN) tablet 5 mg  5 mg Oral TID AC TShirley Friar PA-C   5 mg at 12/12/17 0804  . [MAR Hold] mupirocin ointment (BACTROBAN) 2 % 1 application  1 application Nasal BID MLarey Dresser MD   1 application at 024/26/830809  . [MAR Hold] ondansetron (ZOFRAN) injection 4 mg  4 mg Intravenous Q6H PRN TShirley Friar PA-C      . [MAR Hold] ondansetron (ZOFRAN-ODT) disintegrating tablet 4 mg  4 mg Oral Q6H PRN TShirley Friar PA-C      . [  MAR Hold] pantoprazole (PROTONIX) EC tablet 40 mg  40 mg Oral Daily Shirley Friar, PA-C   40 mg at 12/12/17 0804  . [MAR Hold] pentafluoroprop-tetrafluoroeth (GEBAUERS) aerosol 1 application  1 application Topical PRN Penninger, [name], [place]      . [MAR Hold] promethazine (PHENERGAN) tablet 25 mg  25 mg Oral  Q6H PRN Shirley Friar, PA-C      . [MAR Hold] sildenafil (REVATIO) tablet 20 mg  20 mg Oral TID Shirley Friar, PA-C   20 mg at 12/12/17 0805    Allergies  Allergen Reactions  . Metformin And Related Diarrhea    Review of Systems: As listed above, otherwise negative.   Physical Examination   Vitals:   12/12/17 0331 12/12/17 0645 12/12/17 0721 12/12/17 1157  BP: 93/81  111/86 94/61  Pulse: 63  87 86  Resp: (!) 26  17 (!) 23  Temp: 98 F (36.7 C)  98.7 F (37.1 C) 98 F (36.7 C)  TempSrc:   Oral Oral  SpO2: 97%  97% 98%  Weight:  175 lb 9.6 oz (79.7 kg)    Height:       Body mass index is 27.5 kg/m.  General Alert, O x 3, WD, NAD  Pulmonary Sym exp, good B air movt, CTA B  Cardiac Continuous HS  Musculo- skeletal +Bruit in distal RUA AVG, no bruit in proximal AVG  Neurologic Pain and light touch intact in extremities ,     Laboratory  See Jasper is a 52 y.o. male who presents with: HF w/ LVAD, thrombosed RUA AVG.   The patient is scheduled for: RUA AVG T&R  I discussed with the patient attempting a thrombectomy and possible revision.    I was frank with the patient that the root cause of failure in this case may be the limited cardiac output, so even if the graft is revised this AVG may rethrombose.  Risk, benefits, and alternatives to access surgery were discussed.  The patient is aware the risks include but are not limited to: bleeding, infection, steal syndrome, nerve damage, ischemic monomelic neuropathy, failure to mature, and need for additional procedures.  The patient is aware of the risks and agrees to proceed.   Adele Barthel, MD, FACS [facility] Office: [phone] Pager: [phone]  12/12/2017, 1:05 PM

## 2017-12-13 ENCOUNTER — Encounter (HOSPITAL_COMMUNITY): Payer: Self-pay | Admitting: Vascular Surgery

## 2017-12-13 ENCOUNTER — Telehealth: Payer: Self-pay | Admitting: Vascular Surgery

## 2017-12-13 DIAGNOSIS — I5022 Chronic systolic (congestive) heart failure: Secondary | ICD-10-CM

## 2017-12-13 LAB — CBC
HCT: 30.4 % — ABNORMAL LOW (ref 39.0–52.0)
Hemoglobin: 9.6 g/dL — ABNORMAL LOW (ref 13.0–17.0)
MCH: 28.5 pg (ref 26.0–34.0)
MCHC: 31.6 g/dL (ref 30.0–36.0)
MCV: 90.2 fL (ref 78.0–100.0)
PLATELETS: 207 10*3/uL (ref 150–400)
RBC: 3.37 MIL/uL — AB (ref 4.22–5.81)
RDW: 17.8 % — ABNORMAL HIGH (ref 11.5–15.5)
WBC: 7.8 10*3/uL (ref 4.0–10.5)

## 2017-12-13 LAB — GLUCOSE, CAPILLARY
GLUCOSE-CAPILLARY: 195 mg/dL — AB (ref 65–99)
GLUCOSE-CAPILLARY: 126 mg/dL — AB (ref 65–99)
Glucose-Capillary: 194 mg/dL — ABNORMAL HIGH (ref 65–99)
Glucose-Capillary: 108 mg/dL — ABNORMAL HIGH (ref 65–99)
Glucose-Capillary: 152 mg/dL — ABNORMAL HIGH (ref 65–99)

## 2017-12-13 LAB — BASIC METABOLIC PANEL
ANION GAP: 12 (ref 5–15)
BUN: 49 mg/dL — AB (ref 6–20)
CO2: 22 mmol/L (ref 22–32)
Calcium: 8.1 mg/dL — ABNORMAL LOW (ref 8.9–10.3)
Chloride: 104 mmol/L (ref 101–111)
Creatinine, Ser: 6.88 mg/dL — ABNORMAL HIGH (ref 0.61–1.24)
GFR calc Af Amer: 9 mL/min — ABNORMAL LOW (ref >60)
GFR, EST NON AFRICAN AMERICAN: 8 mL/min — AB (ref >60)
Glucose, Bld: 168 mg/dL — ABNORMAL HIGH (ref 65–99)
POTASSIUM: 4.5 mmol/L (ref 3.5–5.1)
Sodium: 138 mmol/L (ref 135–145)

## 2017-12-13 LAB — HEPARIN LEVEL (UNFRACTIONATED)
HEPARIN UNFRACTIONATED: 0.18 [IU]/mL — AB (ref 0.30–0.70)
HEPARIN UNFRACTIONATED: 0.45 [IU]/mL (ref 0.30–0.70)

## 2017-12-13 LAB — LACTATE DEHYDROGENASE: LDH: 172 U/L (ref 98–192)

## 2017-12-13 LAB — PROTIME-INR
INR: 1.19
PROTHROMBIN TIME: 15.1 s (ref 11.4–15.2)

## 2017-12-13 MED ORDER — WARFARIN - PHARMACIST DOSING INPATIENT
Freq: Every day | Status: DC
  Administered 2017-12-16 – 2017-12-17 (×2)

## 2017-12-13 MED ORDER — WARFARIN SODIUM 7.5 MG PO TABS
7.5000 mg | ORAL_TABLET | Freq: Once | ORAL | Status: AC
  Administered 2017-12-13: 7.5 mg via ORAL
  Filled 2017-12-13: qty 1

## 2017-12-13 NOTE — Progress Notes (Signed)
Patient ID: [name], male   DOB: [birth year], 52 y.o.   MRN: [id number]   Advanced Heart Failure VAD Team Note  PCP-Cardiologist: No primary care provider on file.   Subjective:    Thrombectomy yesterday, some bleeding of op site overnight now stopped.  He had some post-op nausea but this has resolved and he wants breakfast.   No dyspnea.   LVAD INTERROGATION:  HeartMate II LVAD:  Flow 3.7 liters/min, speed 5500, power 4.0, PI 3.9.    Objective:    Vital Signs:   Temp:  [97.4 F (36.3 C)-98.8 F (37.1 C)] 98.5 F (36.9 C) (04/10 0738) Pulse Rate:  [77-88] 83 (04/10 0738) Resp:  [14-24] 17 (04/10 0738) BP: (94-115)/(61-90) 95/65 (04/10 0738) SpO2:  [95 %-100 %] 100 % (04/10 0738) Weight:  [177 lb 11.1 oz (80.6 kg)] 177 lb 11.1 oz (80.6 kg) (04/10 0500) Last BM Date: 12/12/17 Mean arterial Pressure 70-90s  Intake/Output:   Intake/Output Summary (Last 24 hours) at 12/13/2017 0836 Last data filed at 12/13/2017 0322 Gross per 24 hour  Intake 871.4 ml  Output 5 ml  Net 866.4 ml     Physical Exam    GENERAL: Well appearing this am. NAD.  HEENT: Normal. NECK: Supple, JVP 8 cm. Carotids OK.  CARDIAC:  Mechanical heart sounds with LVAD hum present.  LUNGS:  CTAB, normal effort.  ABDOMEN:  NT, ND, no HSM. No bruits or masses. +BS  LVAD exit site: Well-healed and incorporated. Dressing dry and intact. No erythema or drainage. Stabilization device present and accurately applied. Driveline dressing changed daily per sterile technique. EXTREMITIES:  Warm and dry. No cyanosis, clubbing, rash, or edema.  NEUROLOGIC:  Alert & oriented x 3. Cranial nerves grossly intact. Moves all 4 extremities w/o difficulty. Affect pleasant    Telemetry   NSR in 80s, personally reviewed.  EKG    No new tracings.    Labs   Basic Metabolic Panel: Recent Labs  Lab 12/11/17 2008 12/12/17 0600  NA 134* 138  K 4.2 4.1  CL 104 103  CO2 20* 26  GLUCOSE 221* 161*  BUN 64* 22*    CREATININE 8.85* 4.46*  CALCIUM 8.2* 8.5*  PHOS 5.2*  --     Liver Function Tests: Recent Labs  Lab 12/11/17 2008  ALBUMIN 3.0*   No results for input(s): LIPASE, AMYLASE in the last 168 hours. No results for input(s): AMMONIA in the last 168 hours.  CBC: Recent Labs  Lab 12/11/17 2008 12/12/17 0600  WBC 8.1 6.9  HGB 10.6* 11.8*  HCT 34.1* 35.1*  MCV 88.8 89.3  PLT 211 196    INR: Recent Labs  Lab 12/08/17 12/11/17 1020 12/12/17 0600 12/13/17 0336  INR 2.1 1.24 1.26 1.19    Other results:  EKG:    Imaging   Dg Chest Port 1 View  Result Date: 12/11/2017 CLINICAL DATA:  Status post PICC line placement. EXAM: PORTABLE CHEST 1 VIEW COMPARISON:  Radiograph of October 29, 2017. FINDINGS: Stable cardiomegaly. Left-sided pacemaker is unchanged in position. Left ventricular assist device is unchanged. Right internal jugular dialysis catheter is noted with tip in right atrium. Left internal jugular catheter is noted with tip in expected position of the SVC. No pneumothorax is noted. No acute pulmonary disease is noted. Bony thorax is unremarkable. IMPRESSION: Stable position of left ventricular assist device and other support apparatus. No significant change compared to prior exam. Electronically Signed   By: Marijo Conception, M.D.  On: 12/11/2017 17:48     Medications:     Scheduled Medications: . amLODipine  5 mg Oral Once per day on Sun Tue Thu Sat  . aspirin EC  81 mg Oral Daily  . atorvastatin  40 mg Oral q1800  . calcitRIOL  0.5 mcg Oral Q M,W,F-HD  . docusate sodium  200 mg Oral Daily  . dronabinol  2.5 mg Oral BID AC  . feeding supplement (NEPRO CARB STEADY)  237 mL Oral Daily  . hydrALAZINE  37.5 mg Oral 3 times per day on Sun Tue Thu Sat  . insulin aspart  0-5 Units Subcutaneous QHS  . insulin aspart  0-9 Units Subcutaneous TID WC  . insulin aspart  3 Units Subcutaneous TID WC  . insulin glargine  5 Units Subcutaneous QHS  . metoCLOPramide  5 mg Oral  TID AC  . mupirocin ointment  1 application Nasal BID  . pantoprazole  40 mg Oral Daily  . sildenafil  20 mg Oral TID    Infusions: . sodium chloride    . sodium chloride    . sodium chloride    . sodium chloride 10 mL/hr at 12/12/17 1218  . heparin 1,200 Units/hr (12/13/17 0738)    PRN Medications: sodium chloride, sodium chloride, acetaminophen, alteplase, heparin, lidocaine (PF), lidocaine-prilocaine, LORazepam, ondansetron (ZOFRAN) IV, ondansetron, pentafluoroprop-tetrafluoroeth, promethazine **OR** promethazine, sodium chloride flush   Patient Profile   [name] is a 52 y.o. 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   Planned admission for heparin bridge for R AVF repair 12/12/17.  Assessment/Plan:    1. ESRD: Post LVAD placement.  RUE fistula thrombectomy yesterday.  Concern that this will not stay open due to low flow state.  - Will stop amlodipine to try to promote higher flow through fistula.  - HD today. 2. Chronic systolic CHF: Ischemic cardiomyopathy. St Jude ICD. NSTEMI in March 2018 with DES to LAD and RCA, complicated by cardiogenic shock, low output requiring milrinone. Echo 8/18 with EF 20-25%, moderate MR. PX (8/18) with severe functional impairment due to HF. He is now s/p HM3 LVAD placement. LVAD parameters reviewed today and stable, fewer PI events at HD now with lower speed. No complaints. MAP controlled.LDH stable. NYHA II. Volume status stable on exam.  - As above, hold amlodipine.  - Continue ASA 81. Coumadin restarted, on heparin gtt while INR < 1.8.  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 Stable.  -Continue hydralazine37.5 mg tid on NON-HD days.  -He has been referredto [facility] for evaluation for heart/kidney transplant.  3. CAD: NSTEMI in 3/18. LHC with 99% ulcerated  lesion proximal RCA with left to right collaterals, 95% mid LAD stenosis after mid LAD stent. s/p PCI to RCA and LAD on 11/25/16.  - Continue statin and ASA. 4. h/o DVTs: Factor V Leiden heterozygote. - On heparin/warfarin overlap with low INR.  5. Diabetic gastroparesis: Difficult to manage. Some nausea post-op yesterday, resolved today.  - Uses Phenergan suppositories as needed.  - Has seen gastroparesis specialist at [facility] and workup is continuing. . - Continue reglan 5 mg TID/AC 6. DM:  SSI while in house.  7. HTN: As above, will cut out amlodipine to promote higher flow through AVF.  8. PAD:  Stable. bilateral disease on ABIs in 8/18. Denies claudication  currently, no pedal ulcers.  9. Atrial flutter: Paroxysmal.  - NSR on tele.    I reviewed the LVAD parameters from today, and compared the results to the patient's prior recorded data.  No programming changes were made.  The LVAD is functioning within specified parameters.  The patient performs LVAD self-test daily.  LVAD interrogation was negative for any significant power changes, alarms or PI events/speed drops.  LVAD equipment check completed and is in good working order.  Back-up equipment present.   LVAD education done on emergency procedures and precautions and reviewed exit site care.  Length of Stay: 2  Loralie Champagne, MD 12/13/2017, 8:36 AM  VAD Team --- VAD ISSUES ONLY--- Pager [phone] (7am - 7am)  Advanced Heart Failure Team  Pager [phone] (M-F; 7a - 4p)  Please contact [facility] for night-coverage after hours (4p -7a ) and weekends on [email or web address]

## 2017-12-13 NOTE — Procedures (Signed)
Patient was seen on dialysis and the procedure was supervised.  BFR 400  Via PC BP is  112/88.   Patient appears to be tolerating treatment well  Arkin Imran A 12/13/2017

## 2017-12-13 NOTE — Progress Notes (Signed)
Subjective:  Has had persistent bleeding from op site this AM- surg had evald- nurse holding pressure, now has stopped and drain removed- due for HD today.  Vascular concerned that it is low flow state and are not optimistic about it staying open - It is patent at present    Objective Vital signs in last 24 hours: Vitals:   12/13/17 0014 12/13/17 0322 12/13/17 0500 12/13/17 0738  BP: 98/77 115/90  95/65  Pulse:  86    Resp:  (!) 24    Temp: 98.6 F (37 C) 98.8 F (37.1 C)    TempSrc: Oral Oral    SpO2:      Weight:   80.6 kg (177 lb 11.1 oz)   Height:       Weight change: 1.2 kg (2 lb 10.3 oz)  Intake/Output Summary (Last 24 hours) at 12/13/2017 0750 Last data filed at 12/13/2017 2025 Gross per 24 hour  Intake 871.4 ml  Output 5 ml  Net 866.4 ml    Dialysis Orders:  MWF - [facility]  4hrs 27mn, BFR 400, DFR 800, 3K/ 2Ca ,  EDW 80kg (includes 7lb LVAD battery which is not used while admitted d/t wall hook up)  Access: R IJ TDC, RU AVG- cow vein maturing - now patent Heparin 8000 Unit bolus   Venofer 57mq wk - last 4/5 Calcitriol 0.8m40mqHD TIW    Assessment/Plan: 1.  RU AVG occulusion -  per Dr. CheBridgett Larssono mechanical issue for clotting- thinking it is low flow or a central venous occlusion- he is not optimistic that graft will stay open and would not re attempt declot 2.  ESRD - Continue at bedside while admitted per regular MWF schedule via R IJ TDC. [facility]. [facility] but on systemic.  3.  Hypertension/volume - BP more normal for pt - actually low- on norvasc 5, hydralazine 37.5 TID on off HD days and revatio - given midodrine last night but that is not typical med for him.  He feels well with BP in the 80's- volume status seems OK.  I will ask cards if they would be comfortable with me stopping norvasc  4.  Anemia  -  Hgb over 11 on 4/8- may go down with events- not on ESA here   5.  Secondary Hyperparathyroidism -  OP Ca and P within goal.  Continue VDRA. Not on binders. 6.  Nutrition - Renal diet with fluid restrictions when not NPO. Renavite. 7.    Ischemic CM, CAD s/p LVAD and BiV - ICD - per primary 8.    Chronic anticoagulation w/LVAD device - coumadin -heparin bridge per primary. INR 1.19 today- will need to stay until INR out     [facility] [place] Metabolic Panel: Recent Labs  Lab 12/11/17 2008 12/12/17 0600  NA 134* 138  K 4.2 4.1  CL 104 103  CO2 20* 26  GLUCOSE 221* 161*  BUN 64* 22*  CREATININE 8.85* 4.46*  CALCIUM 8.2* 8.5*  PHOS 5.2*  --    Liver Function Tests: Recent Labs  Lab 12/11/17 2008  ALBUMIN 3.0*   No results for input(s): LIPASE, AMYLASE in the last 168 hours. No results for input(s): AMMONIA in the last 168 hours. CBC: Recent Labs  Lab 12/11/17 2008 12/12/17 0600  WBC 8.1 6.9  HGB 10.6* 11.8*  HCT 34.1* 35.1*  MCV 88.8 89.3  PLT 211 196   Cardiac Enzymes: No  results for input(s): CKTOTAL, CKMB, CKMBINDEX, TROPONINI in the last 168 hours. CBG: Recent Labs  Lab 12/12/17 0800 12/12/17 1155 12/12/17 1513 12/12/17 1715 12/12/17 2131  GLUCAP 184* 142* 114* 123* 131*    Iron Studies: No results for input(s): IRON, TIBC, TRANSFERRIN, FERRITIN in the last 72 hours. Studies/Results: Dg Chest Port 1 View  Result Date: 12/11/2017 CLINICAL DATA:  Status post PICC line placement. EXAM: PORTABLE CHEST 1 VIEW COMPARISON:  Radiograph of October 29, 2017. FINDINGS: Stable cardiomegaly. Left-sided pacemaker is unchanged in position. Left ventricular assist device is unchanged. Right internal jugular dialysis catheter is noted with tip in right atrium. Left internal jugular catheter is noted with tip in expected position of the SVC. No pneumothorax is noted. No acute pulmonary disease is noted. Bony thorax is unremarkable. IMPRESSION: Stable position of left ventricular assist device and other support apparatus. No significant change compared to prior exam.  Electronically Signed   By: Marijo Conception, M.D.   On: 12/11/2017 17:48   Medications: Infusions: . sodium chloride    . sodium chloride    . sodium chloride    . sodium chloride 10 mL/hr at 12/12/17 1218  . heparin 1,200 Units/hr (12/13/17 9432)    Scheduled Medications: . amLODipine  5 mg Oral Once per day on Sun Tue Thu Sat  . aspirin EC  81 mg Oral Daily  . atorvastatin  40 mg Oral q1800  . calcitRIOL  0.5 mcg Oral Q M,W,F-HD  . docusate sodium  200 mg Oral Daily  . dronabinol  2.5 mg Oral BID AC  . feeding supplement (NEPRO CARB STEADY)  237 mL Oral Daily  . hydrALAZINE  37.5 mg Oral 3 times per day on Sun Tue Thu Sat  . insulin aspart  0-5 Units Subcutaneous QHS  . insulin aspart  0-9 Units Subcutaneous TID WC  . insulin aspart  3 Units Subcutaneous TID WC  . insulin glargine  5 Units Subcutaneous QHS  . metoCLOPramide  5 mg Oral TID AC  . mupirocin ointment  1 application Nasal BID  . pantoprazole  40 mg Oral Daily  . sildenafil  20 mg Oral TID    have reviewed scheduled and prn medications.  Physical Exam: General: NAD Heart: RRR Lungs: mostly clear Abdomen: soft, non tender Extremities: no edema Dialysis Access: right sided PC and patent  AVG - no current bleeding    12/13/2017,7:50 AM  LOS: 2 days

## 2017-12-13 NOTE — Progress Notes (Signed)
ANTICOAGULATION CONSULT NOTE - Follow Up Consult  Pharmacy Consult for heparin Indication: LVAD  Labs: Recent Labs    12/11/17 1020  12/11/17 2008  12/12/17 0600 12/12/17 1130 12/13/17 0336 12/13/17 0830 12/13/17 1001 12/13/17 2025  HGB  --    < > 10.6*  --  11.8*  --   --  9.6*  --   --   HCT  --   --  34.1*  --  35.1*  --   --  30.4*  --   --   PLT  --   --  211  --  196  --   --  207  --   --   LABPROT 15.5*  --   --   --  15.7*  --  15.1  --   --   --   INR 1.24  --   --   --  1.26  --  1.19  --   --   --   HEPARINUNFRC  --   --  1.01*   < >  --  0.33  --   --  0.18* 0.45  CREATININE  --   --  8.85*  --  4.46*  --   --  6.88*  --   --    < > = values in this interval not displayed.    Assessment: 52yo male subtherapeutic on heparin with initial dosing while Coumadin on hold; patient undergoing RUA AVG thrombectomy revision.  Heparin level this evening was therapeutic at 0.45, on 1350 units/hr. Hgb is 11.8, plts WNL No signs/symptoms of bleeding.    Goal of Therapy:  Heparin level 0.3-0.7 units/ml  INR goal 2-3   Plan:  Continue heparin infusion at 1350 units/hr   Confirmatory heparin level with AM labs Monitor daily HL and CBC.    Cori Henningsen A. Levada Dy, PharmD, [place] Pager: [phone]  12/13/2017,9:41 PM

## 2017-12-13 NOTE — Telephone Encounter (Signed)
-----   Message from Mena Goes, RN sent at 12/13/2017  9:44 AM EDT ----- Regarding: 4 weeks with Dr. Bridgett Larsson   ----- Message ----- From: Conrad McHenry, MD Sent: 12/12/2017   5:53 PM To: Vvs Charge Pool  [name] [id number] [birth year]   PROCEDURE: 1. Simple thrombectomy right upper arm arteriovenous graft   Asst: Dagoberto Ligas, [facility]   Follow-up: 4 weeks with MD

## 2017-12-13 NOTE — Progress Notes (Signed)
Persistent bleeding from wound over night that did not respond to pressure.  Minimal output in drainage tube.  It appears that most of the drainage is from the skin at the drain exit site.  Drain removed and pressure being held.  No significant hematoma. Likely due to heparin.  [name]

## 2017-12-13 NOTE — Progress Notes (Addendum)
   Daily Progress Note   Assessment/Planning:   POD #1 s/p Thrombectomy RUA AVG, Anticoagulated for LVAD   Pt had no evidence of neointimal hyperplasia, so etiology of thrombus likely arterial in nature (or in this case cardiogenic).  Additionally, concerns with central venous stenosis are raised based on the high resistance encountered during passage of Fogarty in right subclavian vein.  In event AVG thomboses, would NOT pursue salvage.  Next access option is likely a thigh AVG given pacer wires in L SCV.  Will observe surgical site for bleeding.  Tamponade is usually enough to control bleeding even with anticoagulation.   Subjective  - 1 Day Post-Op   No complaints, events overnight noted   Objective   Vitals:   12/13/17 0014 12/13/17 0322 12/13/17 0500 12/13/17 0738  BP: 98/77 115/90  95/65  Pulse:  86    Resp:  (!) 24    Temp: 98.6 F (37 C) 98.8 F (37.1 C)    TempSrc: Oral Oral    SpO2:      Weight:   177 lb 11.1 oz (80.6 kg)   Height:         Intake/Output Summary (Last 24 hours) at 12/13/2017 0801 Last data filed at 12/13/2017 0322 Gross per 24 hour  Intake 871.4 ml  Output 5 ml  Net 866.4 ml    VASC +bruit in AVG, ? Thrill, inc bandage with limited blood on new bdg on exit site  NEURO R hand motor and sensation intact    Laboratory   CBC CBC Latest Ref Rng & Units 12/12/2017 12/11/2017 11/16/2017  WBC 4.0 - 10.5 K/uL 6.9 8.1 6.8  Hemoglobin 13.0 - 17.0 g/dL 11.8(L) 10.6(L) 11.1(L)  Hematocrit 39.0 - 52.0 % 35.1(L) 34.1(L) 36.0(L)  Platelets 150 - 400 K/uL 196 211 211    BMET    Component Value Date/Time   NA 138 12/12/2017 0600   NA 144 05/11/2017 0938   K 4.1 12/12/2017 0600   CL 103 12/12/2017 0600   CO2 26 12/12/2017 0600   GLUCOSE 161 (H) 12/12/2017 0600   BUN 22 (H) 12/12/2017 0600   BUN 68 (H) 05/11/2017 0938   CREATININE 4.46 (H) 12/12/2017 0600   CREATININE 1.63 (H) 11/09/2016 1505   CALCIUM 8.5 (L) 12/12/2017 0600   CALCIUM 8.7  06/27/2017 1152   GFRNONAA 14 (L) 12/12/2017 0600   GFRNONAA 48 (L) 11/09/2016 1505   GFRAA 16 (L) 12/12/2017 0600   GFRAA 55 (L) 11/09/2016 1505     Adele Barthel, MD, FACS [facility] [place] Office: [phone] Pager: [phone]  12/13/2017, 8:01 AM

## 2017-12-13 NOTE — Telephone Encounter (Signed)
Spoke to pt for appt 5/8  Mld lttr

## 2017-12-13 NOTE — Progress Notes (Signed)
ANTICOAGULATION CONSULT NOTE - Follow Up Consult  Pharmacy Consult for heparin Indication: LVAD  Labs: Recent Labs    12/11/17 1020  12/11/17 2008 12/12/17 0225 12/12/17 0600 12/12/17 1130 12/13/17 0336 12/13/17 0830 12/13/17 1001  HGB  --    < > 10.6*  --  11.8*  --   --  9.6*  --   HCT  --   --  34.1*  --  35.1*  --   --  30.4*  --   PLT  --   --  211  --  196  --   --  207  --   LABPROT 15.5*  --   --   --  15.7*  --  15.1  --   --   INR 1.24  --   --   --  1.26  --  1.19  --   --   HEPARINUNFRC  --    < > 1.01* 0.18*  --  0.33  --   --  0.18*  CREATININE  --   --  8.85*  --  4.46*  --   --  6.88*  --    < > = values in this interval not displayed.    Assessment: 52yo male subtherapeutic on heparin with initial dosing while Coumadin on hold; patient undergoing RUA AVG thrombectomy revision.  Heparin level this morning was subtherapeutic at 0.18, on 1200 units/hr. Hgb is 11.8, plts WNL No signs/symptoms of bleeding.    Goal of Therapy:  Heparin level 0.3-0.7 units/ml  INR goal 2-3   Plan:  Increase heparin infusion to 1350 units/hr  Order warfarin 7.5 mg today  Monitor heparin level in 8 hours Monitor daily HL and CBC.    Doylene Canard, PharmD Clinical Pharmacist  Pager: [phone] Clinical Phone for 12/13/2017 until 3:30pm: [phone] If after 3:30pm, please call main pharmacy at [phone] 12/13/2017,11:08 AM

## 2017-12-13 NOTE — Progress Notes (Signed)
LVAD Coordinator Rounding Note:  Admitted 12/11/17 to Dr. Aundra Dubin for Heparin bridge for arterial venous graft repair per Dr. Bridgett Larsson.   HM III LVAD implanted on 06/12/17 by Dr. Prescott Gum under Destination Therapy criteria due to renal insufficiency.  Vital signs: Temp: 98.4 HR:  98 Doppler Pressure: 94 Automatic BP:  110/89 (97) O2 Sat: 98% on RA Wt: 175 >175>177 lbs   LVAD interrogation reveals:  Speed: 5500 Flow: 3.7 Power:  4.0 PI: 3.7 Alarms: none  Events:  none Hematocrit: 35 Fixed speed: 5500 Low speed limit: 5200  Drive Line: left abdominal exit site sorbaview dressing dry and intact; anchor in place and accurately applied. Weekly dressing changes; next dressing change is due 12/19/17.  Labs:  LDH trend: 761>607>371  INR trend: 1.24>1.26>1.19  Anticoagulation Plan: -INR Goal: 2.0 - 3.0 -ASA Dose: 81 mg daily  Device: -St Jude dual -Therapies: on at 200 bpm - Monitor: on VT 150 bpm  Adverse Events on VAD: - ESRD post LVAD; started CVVH 06/15/17; HD started 06/20/17 - Hospitalization 11/5 - 07/16/17 > gastroparesis diagnosis after EGD - Hospitalization 11/26 - 08/04/17 > gastroparesis - Hospitalization 1/8 - 09/16/17>gastroparesis - referred to Dr. Corliss Parish at [facility] - 10/31/17>rightupper arm arteriovenous graft with Artegraft; ligation of right first stage brachial vein transposition    Plan/Recommendations:   1. HD scheduled today at bedside. Delayed this am due to bleeding from right arm dressing site.  2. Please call VAD coordinator for any VAD equipment or drive line issues.   Zada Girt RN, VAD Coordinator 24/7 VAD Pager: [phone]

## 2017-12-14 LAB — GLUCOSE, CAPILLARY
GLUCOSE-CAPILLARY: 144 mg/dL — AB (ref 65–99)
Glucose-Capillary: 210 mg/dL — ABNORMAL HIGH (ref 65–99)
Glucose-Capillary: 170 mg/dL — ABNORMAL HIGH (ref 65–99)
Glucose-Capillary: 167 mg/dL — ABNORMAL HIGH (ref 65–99)

## 2017-12-14 LAB — PROTIME-INR
INR: 1.18
Prothrombin Time: 14.9 seconds (ref 11.4–15.2)

## 2017-12-14 LAB — BASIC METABOLIC PANEL
Anion gap: 10 (ref 5–15)
BUN: 21 mg/dL — AB (ref 6–20)
CHLORIDE: 99 mmol/L — AB (ref 101–111)
CO2: 27 mmol/L (ref 22–32)
CREATININE: 4.17 mg/dL — AB (ref 0.61–1.24)
Calcium: 8.4 mg/dL — ABNORMAL LOW (ref 8.9–10.3)
GFR calc Af Amer: 17 mL/min — ABNORMAL LOW (ref >60)
GFR calc non Af Amer: 15 mL/min — ABNORMAL LOW (ref >60)
GLUCOSE: 177 mg/dL — AB (ref 65–99)
POTASSIUM: 4.1 mmol/L (ref 3.5–5.1)
Sodium: 136 mmol/L (ref 135–145)

## 2017-12-14 LAB — CBC WITH DIFFERENTIAL/PLATELET
Basophils Absolute: 0 10*3/uL (ref 0.0–0.1)
Basophils Relative: 0 %
Eosinophils Absolute: 0.3 10*3/uL (ref 0.0–0.7)
Eosinophils Relative: 5 %
HCT: 30.4 % — ABNORMAL LOW (ref 39.0–52.0)
HEMOGLOBIN: 9.6 g/dL — AB (ref 13.0–17.0)
LYMPHS ABS: 1.6 10*3/uL (ref 0.7–4.0)
LYMPHS PCT: 23 %
MCH: 28.3 pg (ref 26.0–34.0)
MCHC: 31.6 g/dL (ref 30.0–36.0)
MCV: 89.7 fL (ref 78.0–100.0)
MONOS PCT: 6 %
Monocytes Absolute: 0.4 10*3/uL (ref 0.1–1.0)
NEUTROS PCT: 66 %
Neutro Abs: 4.7 10*3/uL (ref 1.7–7.7)
Platelets: 199 10*3/uL (ref 150–400)
RBC: 3.39 MIL/uL — AB (ref 4.22–5.81)
RDW: 17.4 % — ABNORMAL HIGH (ref 11.5–15.5)
WBC: 7.1 10*3/uL (ref 4.0–10.5)

## 2017-12-14 LAB — HEPARIN LEVEL (UNFRACTIONATED): HEPARIN UNFRACTIONATED: 0.4 [IU]/mL (ref 0.30–0.70)

## 2017-12-14 LAB — LACTATE DEHYDROGENASE: LDH: 192 U/L (ref 98–192)

## 2017-12-14 MED ORDER — DARBEPOETIN ALFA 60 MCG/0.3ML IJ SOSY
60.0000 ug | PREFILLED_SYRINGE | INTRAMUSCULAR | Status: DC
  Administered 2017-12-15: 60 ug via INTRAVENOUS
  Filled 2017-12-14 (×3): qty 0.3

## 2017-12-14 MED ORDER — INSULIN GLARGINE 100 UNIT/ML ~~LOC~~ SOLN
10.0000 [IU] | Freq: Every day | SUBCUTANEOUS | Status: DC
  Administered 2017-12-14 – 2017-12-17 (×4): 10 [IU] via SUBCUTANEOUS
  Filled 2017-12-14 (×5): qty 0.1

## 2017-12-14 MED ORDER — WARFARIN SODIUM 7.5 MG PO TABS
7.5000 mg | ORAL_TABLET | Freq: Once | ORAL | Status: AC
  Administered 2017-12-14: 7.5 mg via ORAL
  Filled 2017-12-14: qty 1

## 2017-12-14 NOTE — Progress Notes (Signed)
LVAD Coordinator Rounding Note:  Admitted 12/11/17 to Dr. Aundra Dubin for Heparin bridge for arterial venous graft repair per Dr. Bridgett Larsson.   HM III LVAD implanted on 06/12/17 by Dr. Prescott Gum under Destination Therapy criteria due to renal insufficiency.   Vital signs: Temp: 97.9 HR:  89 Doppler Pressure: 94 Automatic BP:  110/89 (97) O2 Sat: 95% on RA Wt: 177>175>177>175 lbs   LVAD interrogation reveals:  Speed: 5500 Flow: 4.0 Power:  4.0 PI: 4.0 Alarms: none  Events:  none Hematocrit: 30 Fixed speed: 5500 Low speed limit: 5200  Drive Line: left abdominal exit site sorbaview dressing dry and intact; anchor in place and accurately applied. Weekly dressing changes; next dressing change is due 12/19/17.  Labs:  LDH trend: 188>187>172>192  INR trend: 1.24>1.26>1.19>1.18  Anticoagulation Plan: -INR Goal: 2.0 - 3.0 -ASA Dose: 81 mg daily  Device: -St Jude dual -Therapies: on at 200 bpm - Monitor: on VT 150 bpm  Adverse Events on VAD: - ESRD post LVAD; started CVVH 06/15/17; HD started 06/20/17 - Hospitalization 11/5 - 07/16/17 > gastroparesis diagnosis after EGD - Hospitalization 11/26 - 08/04/17 > gastroparesis - Hospitalization 1/8 - 09/16/17>gastroparesis - referred to Dr. Corliss Parish at [facility] - 10/31/17>rightupper arm arteriovenous graft with Artegraft; ligation of right first stage brachial vein transposition    Plan/Recommendations:   1. Please call VAD coordinator for any VAD equipment or drive line issues.   Zada Girt RN, VAD Coordinator 24/7 VAD Pager: [phone]

## 2017-12-14 NOTE — Progress Notes (Signed)
   Daily Progress Note   Assessment/Planning:   POD #2 s/p TE RUA AVG, anticoagulation for LVAD   Not unexpected soft hematoma in R axillary incision due to anticoagulation  Would prefer to avoid going back to OR  We discussed the possibility of the hematoma spontaneously decompressing.  If AVG thrombosed, would not pursue as I doubt it can be salvaged (possibly inadequate inflow, inadequate venous outflow).   Subjective  - 2 Days Post-Op   No complaints   Objective   Vitals:   12/14/17 0040 12/14/17 0358 12/14/17 0440 12/14/17 0618  BP: (!) 78/68 94/74 94/74    Pulse:   89   Resp:   (!) 22   Temp:   98.5 F (36.9 C)   TempSrc:   Oral   SpO2:   95%   Weight:    175 lb 4.3 oz (79.5 kg)  Height:         Intake/Output Summary (Last 24 hours) at 12/14/2017 0730 Last data filed at 12/14/2017 0358 Gross per 24 hour  Intake 1269.41 ml  Output 2113 ml  Net -843.59 ml   R axilla: soft moderate sized hematoma in venous exposure  RUA AVG: +continuous sound with thrill   Laboratory   CBC CBC Latest Ref Rng & Units 12/14/2017 12/13/2017 12/12/2017  WBC 4.0 - 10.5 K/uL 7.1 7.8 6.9  Hemoglobin 13.0 - 17.0 g/dL 9.6(L) 9.6(L) 11.8(L)  Hematocrit 39.0 - 52.0 % 30.4(L) 30.4(L) 35.1(L)  Platelets 150 - 400 K/uL 199 207 196    BMET    Component Value Date/Time   NA 136 12/14/2017 0420   NA 144 05/11/2017 0938   K 4.1 12/14/2017 0420   CL 99 (L) 12/14/2017 0420   CO2 27 12/14/2017 0420   GLUCOSE 177 (H) 12/14/2017 0420   BUN 21 (H) 12/14/2017 0420   BUN 68 (H) 05/11/2017 0938   CREATININE 4.17 (H) 12/14/2017 0420   CREATININE 1.63 (H) 11/09/2016 1505   CALCIUM 8.4 (L) 12/14/2017 0420   CALCIUM 8.7 06/27/2017 1152   GFRNONAA 15 (L) 12/14/2017 0420   GFRNONAA 48 (L) 11/09/2016 1505   GFRAA 17 (L) 12/14/2017 0420   GFRAA 55 (L) 11/09/2016 1505     Adele Barthel, MD, FACS [facility] [place] Office: [phone] Pager:  [phone]  12/14/2017, 7:30 AM

## 2017-12-14 NOTE — Progress Notes (Signed)
Patient ID: [name], male   DOB: Jul 25, 1966, 52 y.o.   MRN: [id number]   Advanced Heart Failure VAD Team Note  PCP-Cardiologist: Aundra Dubin  Subjective:    Thrombectomy 4/9.  Stable, no nausea/vomiting.  Walking without dyspnea.  INR 1.18, on heparin gtt and warfarin. MAP 80 today.    Had HD yesterday without problems.   LVAD INTERROGATION:  HeartMate II LVAD:  Flow 4 liters/min, speed 5500, power 4.0, PI 4.2.    Objective:    Vital Signs:   Temp:  [97.9 F (36.6 C)-99.3 F (37.4 C)] 97.9 F (36.6 C) (04/11 0730) Pulse Rate:  [82-102] 89 (04/11 0440) Resp:  [10-23] 15 (04/11 0730) BP: (78-114)/(36-88) 90/72 (04/11 0730) SpO2:  [95 %-100 %] 95 % (04/11 0440) Weight:  [174 lb 6.1 oz (79.1 kg)-179 lb 14.3 oz (81.6 kg)] 175 lb 4.3 oz (79.5 kg) (04/11 0618) Last BM Date: 12/12/17 Mean arterial Pressure 80  Intake/Output:   Intake/Output Summary (Last 24 hours) at 12/14/2017 1143 Last data filed at 12/14/2017 0358 Gross per 24 hour  Intake 769.41 ml  Output 2113 ml  Net -1343.59 ml     Physical Exam    GENERAL: Well appearing this am. NAD.  HEENT: Normal. NECK: Supple, JVP 7-8 cm. Carotids OK.  CARDIAC:  Mechanical heart sounds with LVAD hum present.  LUNGS:  CTAB, normal effort.  ABDOMEN:  NT, ND, no HSM. No bruits or masses. +BS  LVAD exit site: Well-healed and incorporated. Dressing dry and intact. No erythema or drainage. Stabilization device present and accurately applied. Driveline dressing changed daily per sterile technique. EXTREMITIES:  Warm and dry. No cyanosis, clubbing, rash, or edema.  NEUROLOGIC:  Alert & oriented x 3. Cranial nerves grossly intact. Moves all 4 extremities w/o difficulty. Affect pleasant    Telemetry   NSR in 80s, personally reviewed.  EKG    No new tracings.    Labs   Basic Metabolic Panel: Recent Labs  Lab 12/11/17 2008 12/12/17 0600 12/13/17 0830 12/14/17 0420  NA 134* 138 138 136  K 4.2 4.1 4.5 4.1  CL 104 103 104  99*  CO2 20* 26 22 27   GLUCOSE 221* 161* 168* 177*  BUN 64* 22* 49* 21*  CREATININE 8.85* 4.46* 6.88* 4.17*  CALCIUM 8.2* 8.5* 8.1* 8.4*  PHOS 5.2*  --   --   --     Liver Function Tests: Recent Labs  Lab 12/11/17 2008  ALBUMIN 3.0*   No results for input(s): LIPASE, AMYLASE in the last 168 hours. No results for input(s): AMMONIA in the last 168 hours.  CBC: Recent Labs  Lab 12/11/17 2008 12/12/17 0600 12/13/17 0830 12/14/17 0420  WBC 8.1 6.9 7.8 7.1  NEUTROABS  --   --   --  4.7  HGB 10.6* 11.8* 9.6* 9.6*  HCT 34.1* 35.1* 30.4* 30.4*  MCV 88.8 89.3 90.2 89.7  PLT 211 196 207 199    INR: Recent Labs  Lab 12/08/17 12/11/17 1020 12/12/17 0600 12/13/17 0336 12/14/17 0420  INR 2.1 1.24 1.26 1.19 1.18    Other results:  EKG:    Imaging   No results found.   Medications:     Scheduled Medications: . aspirin EC  81 mg Oral Daily  . atorvastatin  40 mg Oral q1800  . calcitRIOL  0.5 mcg Oral Q M,W,F-HD  . [START ON 12/15/2017] darbepoetin (ARANESP) injection - DIALYSIS  60 mcg Intravenous Q Fri-HD  . docusate sodium  200 mg Oral  Daily  . dronabinol  2.5 mg Oral BID AC  . feeding supplement (NEPRO CARB STEADY)  237 mL Oral Daily  . hydrALAZINE  37.5 mg Oral 3 times per day on Sun Tue Thu Sat  . insulin aspart  0-5 Units Subcutaneous QHS  . insulin aspart  0-9 Units Subcutaneous TID WC  . insulin aspart  3 Units Subcutaneous TID WC  . insulin glargine  10 Units Subcutaneous QHS  . metoCLOPramide  5 mg Oral TID AC  . pantoprazole  40 mg Oral Daily  . sildenafil  20 mg Oral TID  . warfarin  7.5 mg Oral ONCE-1800  . Warfarin - Pharmacist Dosing Inpatient   Does not apply q1800    Infusions: . sodium chloride    . sodium chloride    . sodium chloride    . sodium chloride 10 mL/hr at 12/12/17 1218  . heparin 1,350 Units/hr (12/14/17 1043)    PRN Medications: sodium chloride, sodium chloride, acetaminophen, alteplase, heparin, lidocaine (PF),  lidocaine-prilocaine, LORazepam, ondansetron (ZOFRAN) IV, ondansetron, pentafluoroprop-tetrafluoroeth, promethazine **OR** promethazine, sodium chloride flush   Patient Profile   [name] is a 52 y.o. 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   Planned admission for heparin bridge for R AVF repair 12/12/17.  Assessment/Plan:    1. ESRD: Post LVAD placement.  RUE fistula thrombectomy yesterday.  Concern that this will not stay open due to low flow state.  - Now off amlodipine to promote higher flow through fistula.  - HD tomorrow.  2. Chronic systolic CHF: Ischemic cardiomyopathy. St Jude ICD. NSTEMI in March 2018 with DES to LAD and RCA, complicated by cardiogenic shock, low output requiring milrinone. Echo 8/18 with EF 20-25%, moderate MR. PX (8/18) with severe functional impairment due to HF. He is now s/p HM3 LVAD placement. LVAD parameters reviewed today and stable, fewer PI events at HD now with lower speed. No complaints. MAP 80.LDH stable. NYHA II. Volume status stable on exam.  - Now off amlodipine, MAP ok.  - Continue ASA 81. Coumadin restarted, on heparin gtt while INR < 1.8.  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 Stable.  -Continue hydralazine37.5 mg tid on NON-HD days.  -He has been referredto [facility] for evaluation for heart/kidney transplant.  3. CAD: NSTEMI in 3/18. LHC with 99% ulcerated lesion proximal RCA with left to right collaterals, 95% mid LAD stenosis after mid LAD stent. s/p PCI to RCA and LAD on 11/25/16.  - Continue statin and ASA. 4. h/o DVTs: Factor V Leiden heterozygote. - On heparin/warfarin overlap with low INR.  5. Diabetic gastroparesis: Difficult to manage. Some nausea post-op yesterday, resolved today.  - Uses Phenergan suppositories as needed.  - Has seen gastroparesis  specialist at [facility] and workup is continuing. . - Continue reglan 5 mg TID/AC 6. DM:  SSI while in house.  7. HTN: As above, will cut out amlodipine to promote higher flow through AVF.  8. PAD:  Stable. bilateral disease on ABIs in 8/18. Denies claudication currently, no pedal ulcers.  9. Atrial flutter: Paroxysmal.  - NSR on tele.    I reviewed the LVAD parameters from today, and compared the results to the patient's prior recorded data.  No programming changes were made.  The LVAD is functioning within specified parameters.  The  patient performs LVAD self-test daily.  LVAD interrogation was negative for any significant power changes, alarms or PI events/speed drops.  LVAD equipment check completed and is in good working order.  Back-up equipment present.   LVAD education done on emergency procedures and precautions and reviewed exit site care.  Length of Stay: 3  Loralie Champagne, MD 12/14/2017, 11:43 AM  VAD Team --- VAD ISSUES ONLY--- Pager [phone] (7am - 7am)  Advanced Heart Failure Team  Pager [phone] (M-F; 7a - 4p)  Please contact [facility] for night-coverage after hours (4p -7a ) and weekends on [email or web address]

## 2017-12-14 NOTE — Progress Notes (Signed)
Subjective:  Had HD late yesterday - removed 2100- INR still low this AM- no new c/o's - post weight 79.5 kg   Objective Vital signs in last 24 hours: Vitals:   12/14/17 0040 12/14/17 0358 12/14/17 0440 12/14/17 0618  BP: (!) 78/68 94/74 94/74    Pulse:   89   Resp:   (!) 22   Temp:   98.5 F (36.9 C)   TempSrc:   Oral   SpO2:   95%   Weight:    79.5 kg (175 lb 4.3 oz)  Height:       Weight change: 1 kg (2 lb 3.3 oz)  Intake/Output Summary (Last 24 hours) at 12/14/2017 0758 Last data filed at 12/14/2017 0358 Gross per 24 hour  Intake 1269.41 ml  Output 2113 ml  Net -843.59 ml    Dialysis Orders:  MWF - [place].  4hrs 23mn, BFR 400, DFR 800, 3K/ 2Ca ,  EDW 80kg (includes 7lb LVAD battery which is not used while admitted d/t wall hook up)  Access: R IJ TDC, RU AVG- cow vein maturing - now patent Heparin 8000 Unit bolus   Venofer 583mq wk - last 4/5 Calcitriol 0.41m60mqHD TIW    Assessment/Plan: 1.  RU AVG occulusion -  per Dr. CheBridgett Larssono mechanical issue for clotting- thinking it is low flow or a central venous occlusion- he is not optimistic that graft will stay open and would not re attempt declot- not sure what this means in terms of us [place]izing it if it does stay open ? 2.  ESRD - Continue at bedside while admitted per regular MWF schedule via R IJ TDC. K 4.2. [facility] w/HD but on systemic.  3.  Hypertension/volume - BP more normal for pt - actually low- on norvasc 5, hydralazine 37.5 TID on off HD days and revatio - now norvasc stopped- given midodrine times one this hosp but that is not typical med for him.  He feels well with BP in the 80's- volume status seems OK.  4.  Anemia  -  Hgb over 11 on 4/8, now 9.6- not on ESA as OP- will dose ESA here   5.  Secondary Hyperparathyroidism -  OP Ca and P within goal. Continue VDRA. Not on binders. 6.  Nutrition - Renal diet with fluid restrictions when not NPO. Renavite. 7.    Ischemic CM, CAD s/p LVAD  and BiV - ICD - per primary 8.    Chronic anticoagulation w/LVAD device - coumadin -heparin bridge per primary. INR 1.18 today- will need to stay until INR out     [place] Metabolic Panel: Recent Labs  Lab 12/11/17 2008 12/12/17 0600 12/13/17 0830 12/14/17 0420  NA 134* 138 138 136  K 4.2 4.1 4.5 4.1  CL 104 103 104 99*  CO2 20* 26 22 27   GLUCOSE 221* 161* 168* 177*  BUN 64* 22* 49* 21*  CREATININE 8.85* 4.46* 6.88* 4.17*  CALCIUM 8.2* 8.5* 8.1* 8.4*  PHOS 5.2*  --   --   --    Liver Function Tests: Recent Labs  Lab 12/11/17 2008  ALBUMIN 3.0*   No results for input(s): LIPASE, AMYLASE in the last 168 hours. No results for input(s): AMMONIA in the last 168 hours. CBC: Recent Labs  Lab 12/11/17 2008 12/12/17 0600 12/13/17 0830 12/14/17 0420  WBC 8.1 6.9 7.8 7.1  NEUTROABS  --   --   --  4.7  HGB 10.6* 11.8* 9.6* 9.6*  HCT 34.1* 35.1* 30.4* 30.4*  MCV 88.8 89.3 90.2 89.7  PLT 211 196 207 199   Cardiac Enzymes: No results for input(s): CKTOTAL, CKMB, CKMBINDEX, TROPONINI in the last 168 hours. CBG: Recent Labs  Lab 12/13/17 0823 12/13/17 1119 12/13/17 1324 12/13/17 1800 12/13/17 2130  GLUCAP 194* 195* 126* 108* 152*    Iron Studies: No results for input(s): IRON, TIBC, TRANSFERRIN, FERRITIN in the last 72 hours. Studies/Results: No results found. Medications: Infusions: . sodium chloride    . sodium chloride    . sodium chloride    . sodium chloride 10 mL/hr at 12/12/17 1218  . heparin 1,350 Units/hr (12/14/17 0729)    Scheduled Medications: . aspirin EC  81 mg Oral Daily  . atorvastatin  40 mg Oral q1800  . calcitRIOL  0.5 mcg Oral Q M,W,F-HD  . docusate sodium  200 mg Oral Daily  . dronabinol  2.5 mg Oral BID AC  . feeding supplement (NEPRO CARB STEADY)  237 mL Oral Daily  . hydrALAZINE  37.5 mg Oral 3 times per day on Sun Tue Thu Sat  . insulin aspart  0-5 Units Subcutaneous QHS  . insulin aspart  0-9  Units Subcutaneous TID WC  . insulin aspart  3 Units Subcutaneous TID WC  . insulin glargine  5 Units Subcutaneous QHS  . metoCLOPramide  5 mg Oral TID AC  . pantoprazole  40 mg Oral Daily  . sildenafil  20 mg Oral TID  . Warfarin - Pharmacist Dosing Inpatient   Does not apply q1800    have reviewed scheduled and prn medications.  Physical Exam: General: NAD Heart: RRR Lungs: mostly clear Abdomen: soft, non tender Extremities: no edema Dialysis Access: right sided PC and patent  AVG - no current bleeding    12/14/2017,7:58 AM  LOS: 3 days

## 2017-12-14 NOTE — Progress Notes (Signed)
ANTICOAGULATION CONSULT NOTE - Follow Up Consult  Pharmacy Consult for heparin Indication: LVAD  Labs: Recent Labs    12/12/17 0600  12/13/17 0336 12/13/17 0830 12/13/17 1001 12/13/17 2025 12/14/17 0330 12/14/17 0420  HGB 11.8*  --   --  9.6*  --   --   --  9.6*  HCT 35.1*  --   --  30.4*  --   --   --  30.4*  PLT 196  --   --  207  --   --   --  199  LABPROT 15.7*  --  15.1  --   --   --   --  14.9  INR 1.26  --  1.19  --   --   --   --  1.18  HEPARINUNFRC  --    < >  --   --  0.18* 0.45 0.40  --   CREATININE 4.46*  --   --  6.88*  --   --   --  4.17*   < > = values in this interval not displayed.    Assessment: 52yo male subtherapeutic on heparin with initial dosing while Coumadin on hold; patient undergoing RUA AVG thrombectomy revision.  Heparin level this morning was therapeutic at 0.40, on 1350 units/hr. Hgb is 9.6, plts WNL. LDH is 192. INR is 1.18. No infusion issues. No signs/symptoms of bleeding.    Home regimen: 5 mg daily except 7.5 mg TT  Goal of Therapy:  Heparin level 0.3-0.7 units/ml  INR goal 2-3   Plan:  Continue heparin infusion at 1350 units/hr   Order warfarin 7.5 mg today Monitor daily HL, CBC, INR.    Doylene Canard, PharmD Clinical Pharmacist  Pager: [phone] Clinical Phone for 12/14/2017 until 3:30pm: [phone] If after 3:30pm, please call main pharmacy at [phone] 12/14/2017,7:04 AM

## 2017-12-15 LAB — CBC WITH DIFFERENTIAL/PLATELET
BASOS ABS: 0 10*3/uL (ref 0.0–0.1)
BASOS PCT: 0 %
EOS PCT: 5 %
Eosinophils Absolute: 0.4 10*3/uL (ref 0.0–0.7)
HCT: 29.3 % — ABNORMAL LOW (ref 39.0–52.0)
Hemoglobin: 9.4 g/dL — ABNORMAL LOW (ref 13.0–17.0)
LYMPHS PCT: 17 %
Lymphs Abs: 1.5 10*3/uL (ref 0.7–4.0)
MCH: 28.7 pg (ref 26.0–34.0)
MCHC: 32.1 g/dL (ref 30.0–36.0)
MCV: 89.6 fL (ref 78.0–100.0)
Monocytes Absolute: 0.5 10*3/uL (ref 0.1–1.0)
Monocytes Relative: 6 %
Neutro Abs: 6.1 10*3/uL (ref 1.7–7.7)
Neutrophils Relative %: 72 %
PLATELETS: 210 10*3/uL (ref 150–400)
RBC: 3.27 MIL/uL — AB (ref 4.22–5.81)
RDW: 17.2 % — ABNORMAL HIGH (ref 11.5–15.5)
WBC: 8.5 10*3/uL (ref 4.0–10.5)

## 2017-12-15 LAB — BASIC METABOLIC PANEL
ANION GAP: 11 (ref 5–15)
BUN: 45 mg/dL — ABNORMAL HIGH (ref 6–20)
CALCIUM: 8.7 mg/dL — AB (ref 8.9–10.3)
CO2: 24 mmol/L (ref 22–32)
Chloride: 101 mmol/L (ref 101–111)
Creatinine, Ser: 6.58 mg/dL — ABNORMAL HIGH (ref 0.61–1.24)
GFR, EST AFRICAN AMERICAN: 10 mL/min — AB (ref >60)
GFR, EST NON AFRICAN AMERICAN: 9 mL/min — AB (ref >60)
GLUCOSE: 191 mg/dL — AB (ref 65–99)
POTASSIUM: 4.2 mmol/L (ref 3.5–5.1)
SODIUM: 136 mmol/L (ref 135–145)

## 2017-12-15 LAB — GLUCOSE, CAPILLARY
GLUCOSE-CAPILLARY: 127 mg/dL — AB (ref 65–99)
Glucose-Capillary: 213 mg/dL — ABNORMAL HIGH (ref 65–99)
Glucose-Capillary: 105 mg/dL — ABNORMAL HIGH (ref 65–99)
Glucose-Capillary: 176 mg/dL — ABNORMAL HIGH (ref 65–99)

## 2017-12-15 LAB — HEPARIN LEVEL (UNFRACTIONATED): HEPARIN UNFRACTIONATED: 0.37 [IU]/mL (ref 0.30–0.70)

## 2017-12-15 LAB — PROTIME-INR
INR: 1.15
PROTHROMBIN TIME: 14.6 s (ref 11.4–15.2)

## 2017-12-15 LAB — LACTATE DEHYDROGENASE: LDH: 173 U/L (ref 98–192)

## 2017-12-15 MED ORDER — WARFARIN SODIUM 7.5 MG PO TABS
7.5000 mg | ORAL_TABLET | Freq: Once | ORAL | Status: AC
  Administered 2017-12-15: 7.5 mg via ORAL
  Filled 2017-12-15: qty 1

## 2017-12-15 NOTE — Progress Notes (Addendum)
Patient ID: [name], male   DOB: Feb 16, 1966, 52 y.o.   MRN: [id number]   Advanced Heart Failure VAD Team Note  PCP-Cardiologist: Aundra Dubin  Subjective:    Thrombectomy 4/9.     On coumadin and heparin. INR 1.12.   Denies SOB. No nausea/vomiting.    LVAD INTERROGATION:  HeartMate II LVAD:  Flow 4.1 liters/min, speed 5500, power 3.5, PI 4.2. No alarms     Objective:    Vital Signs:   Temp:  [98 F (36.7 C)-98.6 F (37 C)] 98.5 F (36.9 C) (04/12 0312) Pulse Rate:  [95-98] 98 (04/12 0314) Resp:  [15-24] 19 (04/12 0314) BP: (85-104)/(69-86) 104/77 (04/12 0314) SpO2:  [98 %-100 %] 98 % (04/12 0314) Weight:  [178 lb 11.2 oz (81.1 kg)] 178 lb 11.2 oz (81.1 kg) (04/12 0500) Last BM Date: 12/12/17 Mean arterial Pressure 80s   Intake/Output:   Intake/Output Summary (Last 24 hours) at 12/15/2017 0735 Last data filed at 12/15/2017 0700 Gross per 24 hour  Intake 1454.96 ml  Output 0 ml  Net 1454.96 ml     Physical Exam    Physical Exam: GENERAL: NAD  HEENT: normal  NECK: Supple, JVP 7-8 .  2+ bilaterally, no bruits.  No lymphadenopathy or thyromegaly appreciated.   CARDIAC:  Mechanical heart sounds with LVAD hum present. R subclavian HF cath.  LUNGS:  Clear to auscultation bilaterally.  ABDOMEN:  Soft, round, nontender, positive bowel sounds x4.     LVAD exit site: well-healed and incorporated.  Dressing dry and intact.  No erythema or drainage.  Stabilization device present and accurately applied.  Driveline dressing is being changed daily per sterile technique. EXTREMITIES:  Warm and dry, no cyanosis, clubbing, rash or edema . RUE AVF NEUROLOGIC:  Alert and oriented x 4.  Gait steady.  No aphasia.  No dysarthria.  Affect pleasant.      Telemetry   NSR 80s with PVCs.   EKG    No new tracings.    Labs   Basic Metabolic Panel: Recent Labs  Lab 12/11/17 2008 12/12/17 0600 12/13/17 0830 12/14/17 0420 12/15/17 0420  NA 134* 138 138 136 136  K 4.2 4.1  4.5 4.1 4.2  CL 104 103 104 99* 101  CO2 20* 26 22 27 24   GLUCOSE 221* 161* 168* 177* 191*  BUN 64* 22* 49* 21* 45*  CREATININE 8.85* 4.46* 6.88* 4.17* 6.58*  CALCIUM 8.2* 8.5* 8.1* 8.4* 8.7*  PHOS 5.2*  --   --   --   --     Liver Function Tests: Recent Labs  Lab 12/11/17 2008  ALBUMIN 3.0*   No results for input(s): LIPASE, AMYLASE in the last 168 hours. No results for input(s): AMMONIA in the last 168 hours.  CBC: Recent Labs  Lab 12/11/17 2008 12/12/17 0600 12/13/17 0830 12/14/17 0420 12/15/17 0420  WBC 8.1 6.9 7.8 7.1 8.5  NEUTROABS  --   --   --  4.7 6.1  HGB 10.6* 11.8* 9.6* 9.6* 9.4*  HCT 34.1* 35.1* 30.4* 30.4* 29.3*  MCV 88.8 89.3 90.2 89.7 89.6  PLT 211 196 207 199 210    INR: Recent Labs  Lab 12/11/17 1020 12/12/17 0600 12/13/17 0336 12/14/17 0420 12/15/17 0420  INR 1.24 1.26 1.19 1.18 1.15    Other results:  EKG:    Imaging   No results found.   Medications:     Scheduled Medications: . aspirin EC  81 mg Oral Daily  . atorvastatin  40  mg Oral q1800  . calcitRIOL  0.5 mcg Oral Q M,W,F-HD  . darbepoetin (ARANESP) injection - DIALYSIS  60 mcg Intravenous Q Fri-HD  . docusate sodium  200 mg Oral Daily  . dronabinol  2.5 mg Oral BID AC  . feeding supplement (NEPRO CARB STEADY)  237 mL Oral Daily  . hydrALAZINE  37.5 mg Oral 3 times per day on Sun Tue Thu Sat  . insulin aspart  0-5 Units Subcutaneous QHS  . insulin aspart  0-9 Units Subcutaneous TID WC  . insulin aspart  3 Units Subcutaneous TID WC  . insulin glargine  10 Units Subcutaneous QHS  . metoCLOPramide  5 mg Oral TID AC  . pantoprazole  40 mg Oral Daily  . sildenafil  20 mg Oral TID  . Warfarin - Pharmacist Dosing Inpatient   Does not apply q1800    Infusions: . sodium chloride    . sodium chloride    . sodium chloride    . sodium chloride 10 mL/hr at 12/12/17 1218  . heparin 1,350 Units/hr (12/15/17 0530)    PRN Medications: sodium chloride, sodium chloride,  acetaminophen, alteplase, heparin, lidocaine (PF), lidocaine-prilocaine, LORazepam, ondansetron (ZOFRAN) IV, ondansetron, pentafluoroprop-tetrafluoroeth, promethazine **OR** promethazine, sodium chloride flush   Patient Profile   [name] is a 52 y.o. 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   Planned admission for heparin bridge for R AVF repair 12/12/17.  Assessment/Plan:    1. ESRD: Post LVAD placement.  RUE fistula thrombectomy 4/9;.  Concern that this will not stay open due to low flow state.  - Now off amlodipine to promote higher flow through fistula.  - -HD today.  2. Chronic systolic CHF: Ischemic cardiomyopathy. St Jude ICD. NSTEMI in March 2018 with DES to LAD and RCA, complicated by cardiogenic shock, low output requiring milrinone. Echo 8/18 with EF 20-25%, moderate MR. PX (8/18) with severe functional impairment due to HF. He is now s/p HM3 LVAD placement. LVAD parameters reviewed today and stable, fewer PI events at HD now with lower speed. LDH stable.  NYHA II. Maps stable.   - Now off amlodipine,   - Continue ASA 81. INR down 1.12. On coumadin and heparin. He has been drinking nephro daily.  Stop heparin when INR 1.8 or greater.  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 Stable.  -Continue hydralazine37.5 mg tid on NON-HD days.  -He has been referredto [facility] for evaluation for heart/kidney transplant.  3. CAD: NSTEMI in 3/18. LHC with 99% ulcerated lesion proximal RCA with left to right collaterals, 95% mid LAD stenosis after mid LAD stent. s/p PCI to RCA and LAD on 11/25/16.  - Continue statin and ASA. 4. h/o DVTs: Factor V Leiden heterozygote. - On heparin/warfarin overlap with low INR.  5. Diabetic gastroparesis: Difficult to manage. Some nausea post-op yesterday, resolved today.  - Uses  Phenergan suppositories as needed.  - Has seen gastroparesis specialist at [facility] and workup is continuing. . - Continue reglan 5 mg TID/AC 6. DM:  SSI while in house.  7. HTN: Maps in the 80s;.   8. PAD:  Stable. bilateral disease on ABIs in 8/18. Denies claudication currently, no pedal ulcers.  9. Atrial flutter: Paroxysmal.  - maintaining NSR.   Consult cardiac rehab.     I reviewed the  LVAD parameters from today, and compared the results to the patient's prior recorded data.  No programming changes were made.  The LVAD is functioning within specified parameters.  The patient performs LVAD self-test daily.  LVAD interrogation was negative for any significant power changes, alarms or PI events/speed drops.  LVAD equipment check completed and is in good working order.  Back-up equipment present.   LVAD education done on emergency procedures and precautions and reviewed exit site care.  Length of Stay: 4  Amy Clegg, NP 12/15/2017, 7:35 AM  VAD Team --- VAD ISSUES ONLY--- Pager [phone] (7am - 7am)  Advanced Heart Failure Team  Pager [phone] (M-F; 7a - 4p)  Please contact [facility] for night-coverage after hours (4p -7a ) and weekends on [email or web address]  Patient seen with NP, agree with the above note.    INR remains low today, still on heparin gtt warfarin overlap.  No problems overnight.  MAP stable.  Will have HD today.  Home when INR reaches 1.8 and we can stop heparin gtt.   Sherif Millspaugh 12/15/2017 8:41 AM

## 2017-12-15 NOTE — Progress Notes (Signed)
Arrived to patient room 2C-10 at 1040.  Reviewed treatment plan and this RN agrees with plan.  Report received from bedside RN, Hayley.  Consent obtained.  Patient A & O X 4.   Lung sounds clear to ausculation in all fields. No edema. Cardiac:  NSR, LVAD.  Removed caps and cleansed RIJ catheter with chlorhedxidine.  Aspirated ports of heparin and flushed them with saline per protocol.  Connected and secured lines, initiated treatment at 1100.  UF Goal of 3000 mL and net fluid removal 2.5 L.  Will continue to monitor.

## 2017-12-15 NOTE — Progress Notes (Signed)
LVAD Coordinator Rounding Note:  Admitted 12/11/17 to Dr. Aundra Dubin for Heparin bridge for arterial venous graft repair per Dr. Bridgett Larsson.   HM III LVAD implanted on 06/12/17 by Dr. Prescott Gum under Destination Therapy criteria due to renal insufficiency.   Vital signs: Temp: 97.9 HR:  90 Doppler Pressure: 94 Automatic BP:  108/87 (97) O2 Sat: 100% on RA Wt: 177>175>177>175>178 lbs   LVAD interrogation reveals:  Speed: 5500 Flow: 4.0 Power:  4.0 PI: 4.0 Alarms: none  Events:  none Hematocrit: 30 Fixed speed: 5500 Low speed limit: 5200  Drive Line: left abdominal exit site sorbaview dressing dry and intact; anchor in place and accurately applied. Weekly dressing changes; next dressing change is due 12/19/17.  Labs:  LDH trend: 188>187>172>192>173  INR trend: 1.24>1.26>1.19>1.18>1.15  Anticoagulation Plan: -INR Goal: 2.0 - 3.0 -ASA Dose: 81 mg daily  Device: -St Jude dual -Therapies: on at 200 bpm - Monitor: on VT 150 bpm  Adverse Events on VAD: - ESRD post LVAD; started CVVH 06/15/17; HD started 06/20/17 - Hospitalization 11/5 - 07/16/17 > gastroparesis diagnosis after EGD - Hospitalization 11/26 - 08/04/17 > gastroparesis - Hospitalization 1/8 - 09/16/17>gastroparesis - referred to Dr. Corliss Parish at [facility] - 10/31/17>rightupper arm arteriovenous graft with Artegraft; ligation of right first stage brachial vein transposition    Plan/Recommendations:   1. Pt given 4 weekly kits for home use. 2. Continue Heparin gtt. 3. Please call VAD coordinator for any VAD equipment or drive line issues.   [name] RN, VAD Coordinator 24/7 VAD Pager: [phone]

## 2017-12-15 NOTE — Procedures (Signed)
Patient was seen on dialysis and the procedure was supervised.  BFR 400  Via PC BP is  98/82.   Patient appears to be tolerating treatment well  [name] A 12/15/2017

## 2017-12-15 NOTE — Plan of Care (Signed)
Pt progressing adequately towards goals.

## 2017-12-15 NOTE — Progress Notes (Signed)
   Daily Progress Note   Assessment/Planning:   POD #3 s/p Thrombectomy of RUA AVG   Hematoma stable  Reportedly home this weekend  Will recheck R arm in 2 weeks to evaluate incision   Subjective  - 3 Days Post-Op   No complaints   Objective   Vitals:   12/15/17 0312 12/15/17 0314 12/15/17 0500 12/15/17 0841  BP: 104/77 104/77  108/87  Pulse: 95 98  92  Resp: 18 19  15   Temp: 98.5 F (36.9 C)   98.5 F (36.9 C)  TempSrc: Oral   Oral  SpO2: 99% 98%  96%  Weight:   178 lb 11.2 oz (81.1 kg)   Height:         Intake/Output Summary (Last 24 hours) at 12/15/2017 1109 Last data filed at 12/15/2017 0941 Gross per 24 hour  Intake 1082.96 ml  Output 0 ml  Net 1082.96 ml    RUA: axillary hematoma unchanged, no active bleeding, no thrill, +bruit   Laboratory   CBC CBC Latest Ref Rng & Units 12/15/2017 12/14/2017 12/13/2017  WBC 4.0 - 10.5 K/uL 8.5 7.1 7.8  Hemoglobin 13.0 - 17.0 g/dL 9.4(L) 9.6(L) 9.6(L)  Hematocrit 39.0 - 52.0 % 29.3(L) 30.4(L) 30.4(L)  Platelets 150 - 400 K/uL 210 199 207    BMET    Component Value Date/Time   NA 136 12/15/2017 0420   NA 144 05/11/2017 0938   K 4.2 12/15/2017 0420   CL 101 12/15/2017 0420   CO2 24 12/15/2017 0420   GLUCOSE 191 (H) 12/15/2017 0420   BUN 45 (H) 12/15/2017 0420   BUN 68 (H) 05/11/2017 0938   CREATININE 6.58 (H) 12/15/2017 0420   CREATININE 1.63 (H) 11/09/2016 1505   CALCIUM 8.7 (L) 12/15/2017 0420   CALCIUM 8.7 06/27/2017 1152   GFRNONAA 9 (L) 12/15/2017 0420   GFRNONAA 48 (L) 11/09/2016 1505   GFRAA 10 (L) 12/15/2017 0420   GFRAA 55 (L) 11/09/2016 1505     Adele Barthel, MD, FACS [facility] Office: [phone] Pager: [phone]  12/15/2017, 11:09 AM

## 2017-12-15 NOTE — Progress Notes (Signed)
Dialysis treatment completed.  2500 mL ultrafiltrated.  2000 mL net fluid removal.  Patient status unchanged. Lung sounds clear to ausculation in all fields. No edema. Cardiac: NSR.  Cleansed RIJ catheter with chlorhexidine.  Disconnected lines and flushed ports with saline per protocol.  Ports locked with hepairn and capped per protocol.    Report given to bedside, RN Hayley.

## 2017-12-15 NOTE — Progress Notes (Signed)
ANTICOAGULATION CONSULT NOTE - Follow Up Consult  Pharmacy Consult for heparin Indication: LVAD  Labs: Recent Labs    12/13/17 0336  12/13/17 0830  12/13/17 2025 12/14/17 0330 12/14/17 0420 12/15/17 0420  HGB  --    < > 9.6*  --   --   --  9.6* 9.4*  HCT  --   --  30.4*  --   --   --  30.4* 29.3*  PLT  --   --  207  --   --   --  199 210  LABPROT 15.1  --   --   --   --   --  14.9 14.6  INR 1.19  --   --   --   --   --  1.18 1.15  HEPARINUNFRC  --   --   --    < > 0.45 0.40  --  0.37  CREATININE  --   --  6.88*  --   --   --  4.17* 6.58*   < > = values in this interval not displayed.    Assessment: 52yo male subtherapeutic on heparin with initial dosing while Coumadin on hold; patient undergoing RUA AVG thrombectomy revision.  Heparin level this morning was therapeutic at 0.37, on 1350 units/hr. Hgb is 9.4, plts WNL. LDH is 173. INR is 1.15. No infusion issues. No signs/symptoms of bleeding.    Home regimen: 5 mg daily except 7.5 mg TT  Goal of Therapy:  Heparin level 0.3-0.7 units/ml  INR goal 2-3   Plan:  Continue heparin infusion at 1350 units/hr   Warfarin 7.5 mg again today, expect INR should rise tomorrow. Monitor daily HL, CBC, INR.    [name], BCPS  Clinical Pharmacist Pager [phone]  12/15/2017 7:56 AM

## 2017-12-15 NOTE — Progress Notes (Signed)
Subjective: - INR still low this AM, 1.15- no new c/o's - post weight on 4/10 was 79.5 kg- due for HD later today    Objective Vital signs in last 24 hours: Vitals:   12/14/17 2345 12/15/17 0312 12/15/17 0314 12/15/17 0500  BP: (!) 86/75 104/77 104/77   Pulse:  95 98   Resp: (!) 23 18 19    Temp:  98.5 F (36.9 C)    TempSrc:  Oral    SpO2:  99% 98%   Weight:    81.1 kg (178 lb 11.2 oz)  Height:       Weight change: -0.542 kg (-1 lb 3.1 oz)  Intake/Output Summary (Last 24 hours) at 12/15/2017 0045 Last data filed at 12/15/2017 0700 Gross per 24 hour  Intake 1454.96 ml  Output 0 ml  Net 1454.96 ml    Dialysis Orders:  MWF - [facility]  4hrs 29mn, BFR 400, DFR 800, 3K/ 2Ca ,  EDW 80kg (includes 7lb LVAD battery which is not used while admitted d/t wall hook up)  Access: R IJ TDC, RU AVG- cow vein maturing - now patent Heparin 8000 Unit bolus   Venofer 557mq wk - last 4/5 Calcitriol 0.53m78mqHD TIW    Assessment/Plan: 1.  RU AVG occulusion -  per Dr. CheBridgett Larssono mechanical issue for clotting- thinking it is low flow or a central venous occlusion- he is not optimistic that graft will stay open and would not re attempt declot- not sure what this means in terms of us [place]izing it if it does stay open ? 2.  ESRD - Continue at bedside while admitted per regular MWF schedule via R IJ TDC. K 4.2. [facility]HD but on systemic- due later today.  3.  Hypertension/volume - BP more normal for pt - actually low- on norvasc 5, hydralazine 37.5 TID on off HD days and revatio - now norvasc stopped- given midodrine times one this hosp but that is not typical med for him.  He feels well with BP in the 80's- volume status seems OK.  4.  Anemia  -  Hgb over 11 on 4/8, now 9.6- not on ESA as OP- will dose ESA here  - due for 60 on 4/12 5.  Secondary Hyperparathyroidism -  OP Ca and P within goal. Continue VDRA. Not on binders. 6.  Nutrition - Renal diet with fluid restrictions  when not NPO. Renavite. 7.    Ischemic CM, CAD s/p LVAD and BiV - ICD - per primary 8.    Chronic anticoagulation w/LVAD device - coumadin -heparin bridge per primary. INR 1.15 today- will need to stay until INR out     [facility]OL[place] Metabolic Panel: Recent Labs  Lab 12/11/17 2008  12/13/17 0830 12/14/17 0420 12/15/17 0420  NA 134*   < > 138 136 136  K 4.2   < > 4.5 4.1 4.2  CL 104   < > 104 99* 101  CO2 20*   < > 22 27 24   GLUCOSE 221*   < > 168* 177* 191*  BUN 64*   < > 49* 21* 45*  CREATININE 8.85*   < > 6.88* 4.17* 6.58*  CALCIUM 8.2*   < > 8.1* 8.4* 8.7*  PHOS 5.2*  --   --   --   --    < > = values in this interval not displayed.   Liver Function Tests: Recent Labs  Lab 12/11/17 2008  ALBUMIN 3.0*   No results for input(s): LIPASE, AMYLASE in the last 168 hours. No results for input(s): AMMONIA in the last 168 hours. CBC: Recent Labs  Lab 12/11/17 2008 12/12/17 0600 12/13/17 0830 12/14/17 0420 12/15/17 0420  WBC 8.1 6.9 7.8 7.1 8.5  NEUTROABS  --   --   --  4.7 6.1  HGB 10.6* 11.8* 9.6* 9.6* 9.4*  HCT 34.1* 35.1* 30.4* 30.4* 29.3*  MCV 88.8 89.3 90.2 89.7 89.6  PLT 211 196 207 199 210   Cardiac Enzymes: No results for input(s): CKTOTAL, CKMB, CKMBINDEX, TROPONINI in the last 168 hours. CBG: Recent Labs  Lab 12/13/17 2130 12/14/17 0805 12/14/17 1315 12/14/17 1714 12/14/17 2133  GLUCAP 152* 210* 170* 144* 167*    Iron Studies: No results for input(s): IRON, TIBC, TRANSFERRIN, FERRITIN in the last 72 hours. Studies/Results: No results found. Medications: Infusions: . sodium chloride    . sodium chloride    . sodium chloride    . sodium chloride 10 mL/hr at 12/12/17 1218  . heparin 1,350 Units/hr (12/15/17 0749)    Scheduled Medications: . aspirin EC  81 mg Oral Daily  . atorvastatin  40 mg Oral q1800  . calcitRIOL  0.5 mcg Oral Q M,W,F-HD  . darbepoetin (ARANESP) injection - DIALYSIS  60 mcg Intravenous Q  Fri-HD  . docusate sodium  200 mg Oral Daily  . dronabinol  2.5 mg Oral BID AC  . feeding supplement (NEPRO CARB STEADY)  237 mL Oral Daily  . hydrALAZINE  37.5 mg Oral 3 times per day on Sun Tue Thu Sat  . insulin aspart  0-5 Units Subcutaneous QHS  . insulin aspart  0-9 Units Subcutaneous TID WC  . insulin aspart  3 Units Subcutaneous TID WC  . insulin glargine  10 Units Subcutaneous QHS  . metoCLOPramide  5 mg Oral TID AC  . pantoprazole  40 mg Oral Daily  . sildenafil  20 mg Oral TID  . warfarin  7.5 mg Oral ONCE-1800  . Warfarin - Pharmacist Dosing Inpatient   Does not apply q1800    have reviewed scheduled and prn medications.  Physical Exam: General: NAD Heart: RRR Lungs: mostly clear Abdomen: soft, non tender Extremities: no edema Dialysis Access: right sided PC and patent  AVG - no current bleeding    12/15/2017,8:32 AM  LOS: 4 days

## 2017-12-16 LAB — GLUCOSE, CAPILLARY
GLUCOSE-CAPILLARY: 114 mg/dL — AB (ref 65–99)
GLUCOSE-CAPILLARY: 112 mg/dL — AB (ref 65–99)
Glucose-Capillary: 127 mg/dL — ABNORMAL HIGH (ref 65–99)
Glucose-Capillary: 122 mg/dL — ABNORMAL HIGH (ref 65–99)

## 2017-12-16 LAB — CBC WITH DIFFERENTIAL/PLATELET
BASOS ABS: 0 10*3/uL (ref 0.0–0.1)
BASOS PCT: 0 %
EOS ABS: 0.3 10*3/uL (ref 0.0–0.7)
Eosinophils Relative: 3 %
HCT: 28.7 % — ABNORMAL LOW (ref 39.0–52.0)
HEMOGLOBIN: 9.1 g/dL — AB (ref 13.0–17.0)
Lymphocytes Relative: 19 %
Lymphs Abs: 1.9 10*3/uL (ref 0.7–4.0)
MCH: 28.2 pg (ref 26.0–34.0)
MCHC: 31.7 g/dL (ref 30.0–36.0)
MCV: 88.9 fL (ref 78.0–100.0)
MONO ABS: 1 10*3/uL (ref 0.1–1.0)
MONOS PCT: 10 %
NEUTROS PCT: 68 %
Neutro Abs: 6.7 10*3/uL (ref 1.7–7.7)
Platelets: 185 10*3/uL (ref 150–400)
RBC: 3.23 MIL/uL — ABNORMAL LOW (ref 4.22–5.81)
RDW: 17.1 % — AB (ref 11.5–15.5)
WBC: 9.9 10*3/uL (ref 4.0–10.5)

## 2017-12-16 LAB — PROTIME-INR
INR: 1.27
Prothrombin Time: 15.8 seconds — ABNORMAL HIGH (ref 11.4–15.2)

## 2017-12-16 LAB — BASIC METABOLIC PANEL
Anion gap: 10 (ref 5–15)
BUN: 28 mg/dL — ABNORMAL HIGH (ref 6–20)
CALCIUM: 8.5 mg/dL — AB (ref 8.9–10.3)
CO2: 26 mmol/L (ref 22–32)
CREATININE: 4.45 mg/dL — AB (ref 0.61–1.24)
Chloride: 99 mmol/L — ABNORMAL LOW (ref 101–111)
GFR calc non Af Amer: 14 mL/min — ABNORMAL LOW (ref >60)
GFR, EST AFRICAN AMERICAN: 16 mL/min — AB (ref >60)
Glucose, Bld: 120 mg/dL — ABNORMAL HIGH (ref 65–99)
Potassium: 3.9 mmol/L (ref 3.5–5.1)
SODIUM: 135 mmol/L (ref 135–145)

## 2017-12-16 LAB — HEPATITIS B SURFACE ANTIGEN: Hepatitis B Surface Ag: NEGATIVE

## 2017-12-16 LAB — LACTATE DEHYDROGENASE: LDH: 176 U/L (ref 98–192)

## 2017-12-16 LAB — HEPARIN LEVEL (UNFRACTIONATED): HEPARIN UNFRACTIONATED: 0.3 [IU]/mL (ref 0.30–0.70)

## 2017-12-16 MED ORDER — WARFARIN SODIUM 10 MG PO TABS
10.0000 mg | ORAL_TABLET | Freq: Once | ORAL | Status: AC
  Administered 2017-12-16: 10 mg via ORAL
  Filled 2017-12-16: qty 1

## 2017-12-16 NOTE — Plan of Care (Signed)
  Problem: Education: Goal: Knowledge of General Education information will improve Outcome: Progressing   Problem: Health Behavior/Discharge Planning: Goal: Ability to manage health-related needs will improve Outcome: Progressing   Problem: Clinical Measurements: Goal: Ability to maintain clinical measurements within normal limits will improve Outcome: Progressing Goal: Will remain free from infection Outcome: Progressing Goal: Diagnostic test results will improve Outcome: Progressing Goal: Respiratory complications will improve Outcome: Progressing Goal: Cardiovascular complication will be avoided Outcome: Progressing   Problem: Activity: Goal: Risk for activity intolerance will decrease Outcome: Progressing   Problem: Nutrition: Goal: Adequate nutrition will be maintained Outcome: Progressing   Problem: Coping: Goal: Level of anxiety will decrease Outcome: Progressing   Problem: Elimination: Goal: Will not experience complications related to bowel motility Outcome: Progressing Goal: Will not experience complications related to urinary retention Outcome: Progressing   Problem: Pain Managment: Goal: General experience of comfort will improve Outcome: Progressing   Problem: Safety: Goal: Ability to remain free from injury will improve Outcome: Progressing   Problem: Skin Integrity: Goal: Risk for impaired skin integrity will decrease Outcome: Progressing   Problem: Education: Goal: Knowledge of the prescribed therapeutic regimen will improve Outcome: Progressing   Problem: Activity: Goal: Risk for activity intolerance will decrease Outcome: Progressing   Problem: Cardiac: Goal: Ability to maintain an adequate cardiac output will improve Outcome: Progressing   Problem: Coping: Goal: Level of anxiety will decrease Outcome: Progressing   Problem: Fluid Volume: Goal: Risk for excess fluid volume will decrease Outcome: Progressing   Problem: Clinical  Measurements: Goal: Ability to maintain clinical measurements within normal limits will improve Outcome: Progressing Goal: Will remain free from infection Outcome: Progressing   Problem: Respiratory: Goal: Will regain and/or maintain adequate ventilation Outcome: Progressing

## 2017-12-16 NOTE — Progress Notes (Signed)
Patient ID: [name], male   DOB: 16-Aug-1966, 52 y.o.   MRN: [id number]   Advanced Heart Failure VAD Team Note  PCP-Cardiologist: Aundra Dubin  Subjective:    Thrombectomy 4/9.    On coumadin and heparin. INR 1.27.   Denies SOB. No nausea/vomiting.   LVAD INTERROGATION:  HeartMate II LVAD:  Flow 4.2 liters/min, speed 5500, power 3.6, PI 4.2. No alarms     Objective:    Vital Signs:   Temp:  [97.9 F (36.6 C)-99.7 F (37.6 C)] 98.4 F (36.9 C) (04/13 1224) Pulse Rate:  [87-108] 92 (04/13 1224) Resp:  [12-29] 19 (04/13 1224) BP: (75-129)/(61-96) 111/77 (04/13 1224) SpO2:  [95 %-100 %] 95 % (04/13 1224) Weight:  [174 lb 2.6 oz (79 kg)-176 lb 9.4 oz (80.1 kg)] 176 lb 9.4 oz (80.1 kg) (04/13 0445) Last BM Date: 12/15/17 Mean arterial Pressure 80s   Intake/Output:   Intake/Output Summary (Last 24 hours) at 12/16/2017 1315 Last data filed at 12/16/2017 0900 Gross per 24 hour  Intake 847.06 ml  Output 2000 ml  Net -1152.94 ml     Physical Exam    Physical Exam: GENERAL: Well appearing this am. NAD.  HEENT: Normal. NECK: Supple, JVP 7-8 cm. Carotids OK.  CARDIAC:  Mechanical heart sounds with LVAD hum present.  LUNGS:  CTAB, normal effort.  ABDOMEN:  NT, ND, no HSM. No bruits or masses. +BS  LVAD exit site: Well-healed and incorporated. Dressing dry and intact. No erythema or drainage. Stabilization device present and accurately applied. Driveline dressing changed daily per sterile technique. EXTREMITIES:  Warm and dry. No cyanosis, clubbing, rash, or edema.  NEUROLOGIC:  Alert & oriented x 3. Cranial nerves grossly intact. Moves all 4 extremities w/o difficulty. Affect pleasant     Telemetry   NSR 80s with PVCs.   EKG    No new tracings.    Labs   Basic Metabolic Panel: Recent Labs  Lab 12/11/17 2008 12/12/17 0600 12/13/17 0830 12/14/17 0420 12/15/17 0420 12/16/17 0548  NA 134* 138 138 136 136 135  K 4.2 4.1 4.5 4.1 4.2 3.9  CL 104 103 104 99* 101  99*  CO2 20* 26 22 27 24 26   GLUCOSE 221* 161* 168* 177* 191* 120*  BUN 64* 22* 49* 21* 45* 28*  CREATININE 8.85* 4.46* 6.88* 4.17* 6.58* 4.45*  CALCIUM 8.2* 8.5* 8.1* 8.4* 8.7* 8.5*  PHOS 5.2*  --   --   --   --   --     Liver Function Tests: Recent Labs  Lab 12/11/17 2008  ALBUMIN 3.0*   No results for input(s): LIPASE, AMYLASE in the last 168 hours. No results for input(s): AMMONIA in the last 168 hours.  CBC: Recent Labs  Lab 12/12/17 0600 12/13/17 0830 12/14/17 0420 12/15/17 0420 12/16/17 0548  WBC 6.9 7.8 7.1 8.5 9.9  NEUTROABS  --   --  4.7 6.1 6.7  HGB 11.8* 9.6* 9.6* 9.4* 9.1*  HCT 35.1* 30.4* 30.4* 29.3* 28.7*  MCV 89.3 90.2 89.7 89.6 88.9  PLT 196 207 199 210 185    INR: Recent Labs  Lab 12/12/17 0600 12/13/17 0336 12/14/17 0420 12/15/17 0420 12/16/17 0325  INR 1.26 1.19 1.18 1.15 1.27    Other results:  EKG:    Imaging   No results found.   Medications:     Scheduled Medications: . aspirin EC  81 mg Oral Daily  . atorvastatin  40 mg Oral q1800  . calcitRIOL  0.5  mcg Oral Q M,W,F-HD  . darbepoetin (ARANESP) injection - DIALYSIS  60 mcg Intravenous Q Fri-HD  . docusate sodium  200 mg Oral Daily  . dronabinol  2.5 mg Oral BID AC  . feeding supplement (NEPRO CARB STEADY)  237 mL Oral Daily  . hydrALAZINE  37.5 mg Oral 3 times per day on Sun Tue Thu Sat  . insulin aspart  0-5 Units Subcutaneous QHS  . insulin aspart  0-9 Units Subcutaneous TID WC  . insulin aspart  3 Units Subcutaneous TID WC  . insulin glargine  10 Units Subcutaneous QHS  . metoCLOPramide  5 mg Oral TID AC  . pantoprazole  40 mg Oral Daily  . sildenafil  20 mg Oral TID  . warfarin  10 mg Oral ONCE-1800  . Warfarin - Pharmacist Dosing Inpatient   Does not apply q1800    Infusions: . sodium chloride    . sodium chloride    . sodium chloride    . sodium chloride 10 mL/hr at 12/12/17 1218  . heparin 1,350 Units/hr (12/16/17 0900)    PRN Medications: sodium  chloride, sodium chloride, acetaminophen, alteplase, heparin, lidocaine (PF), lidocaine-prilocaine, LORazepam, ondansetron (ZOFRAN) IV, ondansetron, pentafluoroprop-tetrafluoroeth, promethazine **OR** promethazine, sodium chloride flush   Patient Profile   [name] is a 52 y.o. 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   Planned admission for heparin bridge for R AVF repair 12/12/17.  Assessment/Plan:    1. ESRD: Post LVAD placement.  RUE fistula thrombectomy 4/9;.  Concern that this will not stay open due to low flow state.  - Now off amlodipine to promote higher flow through fistula.  - HD today again on Monday.  2. Chronic systolic CHF: Ischemic cardiomyopathy. [employer] ICD. NSTEMI in March 2018 with DES to LAD and RCA, complicated by cardiogenic shock, low output requiring milrinone. Echo 8/18 with EF 20-25%, moderate MR. PX (8/18) with severe functional impairment due to HF. He is now s/p HM3 LVAD placement. LVAD parameters reviewed today and stable, fewer PI events at HD now with lower speed. LDH stable. NYHA II. MAP stable.   - Now off amlodipine,   - Continue ASA 81. On coumadin and heparin. Stop heparin when INR 1.8 or greater.  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 Stable.  -Continue hydralazine37.5 mg tid on NON-HD days.  -He has been referredto [facility] for evaluation for heart/kidney transplant.  3. CAD: NSTEMI in 3/18. LHC with 99% ulcerated lesion proximal RCA with left to right collaterals, 95% mid LAD stenosis after mid LAD stent. s/p PCI to RCA and LAD on 11/25/16.  - Continue statin and ASA. 4. h/o DVTs: Factor V Leiden heterozygote. - On heparin/warfarin overlap with low INR.  5. Diabetic gastroparesis: Difficult to manage. Some nausea post-op, no further problems. - Uses Phenergan  suppositories as needed.  - Has seen gastroparesis specialist at [facility] and workup is continuing. . - Continue reglan 5 mg TID/AC 6. DM:  SSI while in house.  7. HTN: MAP in the 80s;.   8. PAD:  Stable. bilateral disease on ABIs in 8/18. Denies claudication currently, no pedal ulcers.  9. Atrial flutter: Paroxysmal.  - maintaining NSR.   I reviewed the LVAD parameters from today, and compared the results to the patient's prior recorded data.  No  programming changes were made.  The LVAD is functioning within specified parameters.  The patient performs LVAD self-test daily.  LVAD interrogation was negative for any significant power changes, alarms or PI events/speed drops.  LVAD equipment check completed and is in good working order.  Back-up equipment present.   LVAD education done on emergency procedures and precautions and reviewed exit site care.  Length of Stay: [place], MD 12/16/2017, 1:15 PM  VAD Team --- VAD ISSUES ONLY--- Pager [phone] (7am - 7am)  Advanced Heart Failure Team  Pager [phone] (M-F; 7a - 4p)  Please contact [facility] for night-coverage after hours (4p -7a ) and weekends on [email or web address]

## 2017-12-16 NOTE — Progress Notes (Signed)
Subjective: - INR still low this AM, 1.15- no new c/o's - post weight on 4/10 was 79.5 kg- due for HD later today    Objective Vital signs in last 24 hours: Vitals:   12/16/17 0000 12/16/17 0445 12/16/17 0450 12/16/17 0756  BP: (!) 129/96 105/77 105/77 (!) 107/94  Pulse: (!) 101 95  87  Resp: 12 19  20   Temp: 99.6 F (37.6 C) 99.7 F (37.6 C)  98.8 F (37.1 C)  TempSrc: Oral Oral  Oral  SpO2: 100% 98%  100%  Weight:  80.1 kg (176 lb 9.4 oz)    Height:       Weight change: -0.058 kg (-2 oz)  Intake/Output Summary (Last 24 hours) at 12/16/2017 1129 Last data filed at 12/16/2017 0900 Gross per 24 hour  Intake 931.44 ml  Output 2000 ml  Net -1068.56 ml    Dialysis Orders:  MWF - [facility]  4hrs 78mn, BFR 400, DFR 800, 3K/ 2Ca ,  EDW 80kg (includes 7lb LVAD battery which is not used while admitted d/t wall hook up) Access: R IJ TDC, RU AVG- cow vein maturing - now patent Heparin 8000 Unit bolus   Venofer 516mq wk - last 4/5 Calcitriol 0.66m62mqHD TIW    Assessment/Plan: 1.  RU AVG occulusion -  per Dr. CheBridgett Larssono mechanical issue for clotting.  Chronic thrombus was removed in OR w/o obvious etiology for the thrombosis per VVS notes.  VVS suggesting it is low flow and/ or a central venous occlusion. Dr CheBridgett Larssont optimistic that graft will stay open and would not re attempt repeat declot.  Not sure what this means in terms of us [place]ailizing AVG if it does stay open however.  2.  ESRD - Continue at bedside while admitted per regular MWF schedule via R IJ TDC. K [facility]2. [facility].  Average UF is about 2 L.  3.  Hypertension/volume - BP more normal for pt - actually low- on norvasc 5, hydralazine 37.5 TID on off HD days and revatio - now norvasc stopped- given midodrine times one this hosp but that is not typical med for him.  He feels well with BP in the 80's- volume status seems OK.  4.  Anemia  -  Hgb over 11 on 4/8, now 9.6- not on ESA as OP- will dose  ESA here  - due for 60 on 4/12 5.  Secondary Hyperparathyroidism -  OP Ca and P within goal. Continue VDRA. Not on binders. 6.  Nutrition - Renal diet with fluid restrictions when not NPO. Renavite. 7.    Ischemic CM, CAD s/p LVAD and BiV - ICD - per primary 8.    Chronic anticoagulation w/LVAD device - coumadin -heparin bridge per primary. INR 1.15 today- will need to stay until INR in range    RobKelly Splinter CarKindred Hospital Northern Indianar ([id number]/13/2019, 11:49 AM          Labs: Basic Metabolic Panel: Recent Labs  Lab 12/11/17 2008  12/14/17 0420 12/15/17 0420 12/16/17 0548  NA 134*   < > 136 136 135  K 4.2   < > 4.1 4.2 3.9  CL 104   < > 99* 101 99*  CO2 20*   < > 27 24 26   GLUCOSE 221*   < > 177* 191* 120*  BUN 64*   < > 21* 45* 28*  CREATININE 8.85*   < >  4.17* 6.58* 4.45*  CALCIUM 8.2*   < > 8.4* 8.7* 8.5*  PHOS 5.2*  --   --   --   --    < > = values in this interval not displayed.   Liver Function Tests: Recent Labs  Lab 12/11/17 2008  ALBUMIN 3.0*   No results for input(s): LIPASE, AMYLASE in the last 168 hours. No results for input(s): AMMONIA in the last 168 hours. CBC: Recent Labs  Lab 12/12/17 0600 12/13/17 0830 12/14/17 0420 12/15/17 0420 12/16/17 0548  WBC 6.9 7.8 7.1 8.5 9.9  NEUTROABS  --   --  4.7 6.1 6.7  HGB 11.8* 9.6* 9.6* 9.4* 9.1*  HCT 35.1* 30.4* 30.4* 29.3* 28.7*  MCV 89.3 90.2 89.7 89.6 88.9  PLT 196 207 199 210 185   Cardiac Enzymes: No results for input(s): CKTOTAL, CKMB, CKMBINDEX, TROPONINI in the last 168 hours. CBG: Recent Labs  Lab 12/15/17 0839 12/15/17 1233 12/15/17 1744 12/15/17 2207 12/16/17 0755  GLUCAP 213* 105* 127* 176* 114*    Iron Studies: No results for input(s): IRON, TIBC, TRANSFERRIN, FERRITIN in the last 72 hours. Studies/Results: No results found. Medications: Infusions: . sodium chloride    . sodium chloride    . sodium chloride    . sodium chloride 10 mL/hr at 12/12/17  1218  . heparin 1,350 Units/hr (12/16/17 0900)    Scheduled Medications: . aspirin EC  81 mg Oral Daily  . atorvastatin  40 mg Oral q1800  . calcitRIOL  0.5 mcg Oral Q M,W,F-HD  . darbepoetin (ARANESP) injection - DIALYSIS  60 mcg Intravenous Q Fri-HD  . docusate sodium  200 mg Oral Daily  . dronabinol  2.5 mg Oral BID AC  . feeding supplement (NEPRO CARB STEADY)  237 mL Oral Daily  . hydrALAZINE  37.5 mg Oral 3 times per day on Sun Tue Thu Sat  . insulin aspart  0-5 Units Subcutaneous QHS  . insulin aspart  0-9 Units Subcutaneous TID WC  . insulin aspart  3 Units Subcutaneous TID WC  . insulin glargine  10 Units Subcutaneous QHS  . metoCLOPramide  5 mg Oral TID AC  . pantoprazole  40 mg Oral Daily  . sildenafil  20 mg Oral TID  . warfarin  10 mg Oral ONCE-1800  . Warfarin - Pharmacist Dosing Inpatient   Does not apply q1800    have reviewed scheduled and prn medications.  Physical Exam: General: NAD Heart: RRR Lungs: mostly clear Abdomen: soft, non tender Extremities: no edema Dialysis Access: right sided PC and patent  AVG - no current bleeding    12/16/2017,11:29 AM  LOS: 5 days

## 2017-12-16 NOTE — Progress Notes (Signed)
ANTICOAGULATION CONSULT NOTE - Follow Up Consult  Pharmacy Consult for heparin Indication: LVAD  Labs: Recent Labs    12/14/17 0330 12/14/17 0420 12/15/17 0420 12/16/17 0325 12/16/17 0548  HGB  --  9.6* 9.4*  --  9.1*  HCT  --  30.4* 29.3*  --  28.7*  PLT  --  199 210  --  185  LABPROT  --  14.9 14.6 15.8*  --   INR  --  1.18 1.15 1.27  --   HEPARINUNFRC 0.40  --  0.37 0.30  --   CREATININE  --  4.17* 6.58*  --  4.45*    Assessment: 52yo male subtherapeutic on heparin with initial dosing while Coumadin on hold; patient undergoing RUA AVG thrombectomy revision.  Heparin level this morning was therapeutic at 0.3, on 1350 units/hr. Hgb is 9.1, plts WNL. LDH is stable. INR is 1.2. No infusion issues. No signs/symptoms of bleeding.    Home regimen: 5 mg daily except 7.5 mg TT  Goal of Therapy:  Heparin level 0.3-0.7 units/ml  INR goal 2-3   Plan:  Continue heparin infusion at 1350 units/hr   Warfarin 60m tonight Monitor daily HL, CBC, INR.    FErin HearingPharmD., BCPS Clinical Pharmacist 12/16/2017 8:10 AM

## 2017-12-16 NOTE — Progress Notes (Signed)
Notified MD about temperature 101.4 tylenol given. MD will review chart and possible place orders. I will continue to monitor.

## 2017-12-17 LAB — BASIC METABOLIC PANEL
Anion gap: 11 (ref 5–15)
BUN: 48 mg/dL — AB (ref 6–20)
CHLORIDE: 99 mmol/L — AB (ref 101–111)
CO2: 25 mmol/L (ref 22–32)
CREATININE: 6.32 mg/dL — AB (ref 0.61–1.24)
Calcium: 8.7 mg/dL — ABNORMAL LOW (ref 8.9–10.3)
GFR calc Af Amer: 10 mL/min — ABNORMAL LOW (ref >60)
GFR calc non Af Amer: 9 mL/min — ABNORMAL LOW (ref >60)
Glucose, Bld: 174 mg/dL — ABNORMAL HIGH (ref 65–99)
Potassium: 4.2 mmol/L (ref 3.5–5.1)
SODIUM: 135 mmol/L (ref 135–145)

## 2017-12-17 LAB — CBC WITH DIFFERENTIAL/PLATELET
Basophils Absolute: 0 10*3/uL (ref 0.0–0.1)
Basophils Relative: 0 %
EOS ABS: 0.2 10*3/uL (ref 0.0–0.7)
EOS PCT: 2 %
HCT: 28 % — ABNORMAL LOW (ref 39.0–52.0)
HEMOGLOBIN: 8.9 g/dL — AB (ref 13.0–17.0)
LYMPHS ABS: 1.8 10*3/uL (ref 0.7–4.0)
Lymphocytes Relative: 16 %
MCH: 28.2 pg (ref 26.0–34.0)
MCHC: 31.8 g/dL (ref 30.0–36.0)
MCV: 88.6 fL (ref 78.0–100.0)
MONOS PCT: 9 %
Monocytes Absolute: 1 10*3/uL (ref 0.1–1.0)
Neutro Abs: 7.9 10*3/uL — ABNORMAL HIGH (ref 1.7–7.7)
Neutrophils Relative %: 73 %
PLATELETS: 200 10*3/uL (ref 150–400)
RBC: 3.16 MIL/uL — ABNORMAL LOW (ref 4.22–5.81)
RDW: 17.1 % — ABNORMAL HIGH (ref 11.5–15.5)
WBC: 11 10*3/uL — ABNORMAL HIGH (ref 4.0–10.5)

## 2017-12-17 LAB — PROTIME-INR
INR: 1.5
Prothrombin Time: 17.9 seconds — ABNORMAL HIGH (ref 11.4–15.2)

## 2017-12-17 LAB — GLUCOSE, CAPILLARY
GLUCOSE-CAPILLARY: 134 mg/dL — AB (ref 65–99)
GLUCOSE-CAPILLARY: 92 mg/dL (ref 65–99)
Glucose-Capillary: 229 mg/dL — ABNORMAL HIGH (ref 65–99)
Glucose-Capillary: 103 mg/dL — ABNORMAL HIGH (ref 65–99)

## 2017-12-17 LAB — LACTATE DEHYDROGENASE: LDH: 170 U/L (ref 98–192)

## 2017-12-17 LAB — HEPARIN LEVEL (UNFRACTIONATED): HEPARIN UNFRACTIONATED: 0.25 [IU]/mL — AB (ref 0.30–0.70)

## 2017-12-17 MED ORDER — WARFARIN SODIUM 10 MG PO TABS
10.0000 mg | ORAL_TABLET | Freq: Once | ORAL | Status: AC
  Administered 2017-12-17: 10 mg via ORAL
  Filled 2017-12-17: qty 1

## 2017-12-17 NOTE — Progress Notes (Addendum)
ANTICOAGULATION CONSULT NOTE - Follow Up Consult  Pharmacy Consult for heparin Indication: LVAD  Labs: Recent Labs    12/15/17 0420 12/16/17 0325 12/16/17 0548 12/17/17 0459  HGB 9.4*  --  9.1* 8.9*  HCT 29.3*  --  28.7* 28.0*  PLT 210  --  185 200  LABPROT 14.6 15.8*  --  17.9*  INR 1.15 1.27  --  1.50  HEPARINUNFRC 0.37 0.30  --  0.25*  CREATININE 6.58*  --  4.45* 6.32*    Assessment: 52yo male subtherapeutic on heparin with initial dosing while Coumadin on hold; patient undergoing RUA AVG thrombectomy revision.  Heparin level this morning was below goal at 0.25, on 1350 units/hr. Hgb is 8.9, plts WNL. LDH is stable. INR is 1.5 trend up but still below goal.  No infusion issues noted with heparin. No signs/symptoms of bleeding.    Home regimen: 5 mg daily except 7.5 mg TT  Goal of Therapy:  Heparin level 0.3-0.7 units/ml  INR goal 2-3   Plan:  Increase heparin infusion to 1450 units/hr   Warfarin 2m again tonight Monitor daily HL, CBC, INR.    FErin HearingPharmD., BCPS Clinical Pharmacist 12/17/2017 9:44 AM

## 2017-12-17 NOTE — Progress Notes (Addendum)
Subjective: - INR still low this AM, 1.15- no new c/o's - post weight on 4/10 was 79.5 kg- due for HD later today    Objective Vital signs in last 24 hours: Vitals:   12/17/17 0335 12/17/17 0354 12/17/17 0500 12/17/17 0725  BP: 107/88 107/88  103/78  Pulse:  88    Resp:  19  20  Temp:  98.4 F (36.9 C)  99.1 F (37.3 C)  TempSrc:  Oral  Oral  SpO2:  98%  96%  Weight:   80.8 kg (178 lb 1.6 oz)   Height:       Weight change: -0.214 kg (-7.6 oz)  Intake/Output Summary (Last 24 hours) at 12/17/2017 1112 Last data filed at 12/16/2017 1700 Gross per 24 hour  Intake 1007 ml  Output -  Net 1007 ml    Dialysis Orders:  MWF - [facility] 4h 158mn  400/800   3K/ 2Ca   80kg (includes 7lb LVAD battery which is not used while admitted d/t wall hook up)    R IJ TDC/ RUA AVG (Artegraft, 10/31/17, Dr CBridgett Larsson   Hep 8000 bolus - Venofer 542mq wk - last 4/5 - Calcitriol 0.58m54mqHD TIW    Assessment/Plan: 1.  RUA AVG occulusion -  per Dr. CheBridgett Larssono mechanical issue for clotting.  Chronic thrombus was removed in OR w/o obvious etiology for the thrombosis per VVS notes.  VVS suggesting it is low flow and/ or a central venous occlusion. Dr CheBridgett Larssont optimistic that graft will stay open and would not re attempt repeat declot.  According to the literature, LVAD dialysis patients may not have a good bruit in their access despite normal functioning of the access; this is due to nonpulsatile flow, which is a consequence of the LVAD technology.  This is why AV fistula's are not attempted usually in LVAD patients due to the need for pulsatile flow for proper maturation.  Since the AVG was declotted 4/9 and we can't rely on a good bruit as a test for a functional AVG, will plan to stick and use the AVG tomorrow as permits.  2. ESRD - Continue at bedside while admitted per regular MWF schedule via R IJ TDC. K 4.2. [facility]  Average UF is about 2 L.  3.  Hypertension/volume - BP more normal for pt  - actually low- on norvasc 5, hydralazine 37.5 TID on off HD days and revatio - now norvasc stopped- given midodrine times one this hosp but that is not typical med for him.  He feels well with BP in the 80's- volume status on exam is OK.  4.  Anemia  -  Hgb over 11 on 4/8, now 9.6- not on ESA as OP, started darbe here got  60ug on 4/12 5.  Secondary Hyperparathyroidism -  OP Ca and P within goal. Continue VDRA. Not on binders. 6.  Nutrition - Renal diet with fluid restrictions when not NPO. Renavite. 7.    Ischemic CM, CAD s/p LVAD and BiV - ICD - per primary 8.    Chronic anticoagulation w/LVAD device - coumadin -heparin bridge per primary. INR 1.15 today- will need to stay until INR in range, up to 1.5 today 9.    Limited code - no CPR, everything else yes 10.    Dispo - awaiting INR in range    RobKelly Splinter CarPutnam General Hospitalr ([id number]/14/2019, 11:12 AM  Labs: Basic Metabolic Panel: Recent Labs  Lab 12/11/17 2008  12/15/17 0420 12/16/17 0548 12/17/17 0459  NA 134*   < > 136 135 135  K 4.2   < > 4.2 3.9 4.2  CL 104   < > 101 99* 99*  CO2 20*   < > 24 26 25   GLUCOSE 221*   < > 191* 120* 174*  BUN 64*   < > 45* 28* 48*  CREATININE 8.85*   < > 6.58* 4.45* 6.32*  CALCIUM 8.2*   < > 8.7* 8.5* 8.7*  PHOS 5.2*  --   --   --   --    < > = values in this interval not displayed.   Liver Function Tests: Recent Labs  Lab 12/11/17 2008  ALBUMIN 3.0*   No results for input(s): LIPASE, AMYLASE in the last 168 hours. No results for input(s): AMMONIA in the last 168 hours. CBC: Recent Labs  Lab 12/13/17 0830  12/14/17 0420 12/15/17 0420 12/16/17 0548 12/17/17 0459  WBC 7.8  --  7.1 8.5 9.9 11.0*  NEUTROABS  --    < > 4.7 6.1 6.7 7.9*  HGB 9.6*  --  9.6* 9.4* 9.1* 8.9*  HCT 30.4*  --  30.4* 29.3* 28.7* 28.0*  MCV 90.2  --  89.7 89.6 88.9 88.6  PLT 207  --  199 210 185 200   < > = values in this interval not displayed.   Cardiac  Enzymes: No results for input(s): CKTOTAL, CKMB, CKMBINDEX, TROPONINI in the last 168 hours. CBG: Recent Labs  Lab 12/16/17 0755 12/16/17 1223 12/16/17 1728 12/16/17 2133 12/17/17 0839  GLUCAP 114* 112* 127* 122* 134*    Iron Studies: No results for input(s): IRON, TIBC, TRANSFERRIN, FERRITIN in the last 72 hours. Studies/Results: No results found. Medications: Infusions: . sodium chloride    . sodium chloride    . sodium chloride    . sodium chloride 10 mL/hr at 12/12/17 1218  . heparin 1,350 Units/hr (12/17/17 0700)    Scheduled Medications: . aspirin EC  81 mg Oral Daily  . atorvastatin  40 mg Oral q1800  . calcitRIOL  0.5 mcg Oral Q M,W,F-HD  . darbepoetin (ARANESP) injection - DIALYSIS  60 mcg Intravenous Q Fri-HD  . docusate sodium  200 mg Oral Daily  . dronabinol  2.5 mg Oral BID AC  . feeding supplement (NEPRO CARB STEADY)  237 mL Oral Daily  . hydrALAZINE  37.5 mg Oral 3 times per day on Sun Tue Thu Sat  . insulin aspart  0-5 Units Subcutaneous QHS  . insulin aspart  0-9 Units Subcutaneous TID WC  . insulin aspart  3 Units Subcutaneous TID WC  . insulin glargine  10 Units Subcutaneous QHS  . metoCLOPramide  5 mg Oral TID AC  . pantoprazole  40 mg Oral Daily  . sildenafil  20 mg Oral TID  . warfarin  10 mg Oral ONCE-1800  . Warfarin - Pharmacist Dosing Inpatient   Does not apply q1800    have reviewed scheduled and prn medications.  Physical Exam: General: NAD Heart: RRR Lungs: mostly clear Abdomen: soft, non tender Extremities: no edema Dialysis Access: right sided PC and patent  AVG - no current bleeding    12/17/2017,11:12 AM  LOS: 6 days

## 2017-12-17 NOTE — Progress Notes (Signed)
Patient ID: [name], male   DOB: [birth year], 52 y.o.   MRN: [id number]   Advanced Heart Failure VAD Team Note  PCP-Cardiologist: Aundra Dubin  Subjective:    Thrombectomy 4/9.    On coumadin and heparin. INR 1.5.   Denies SOB. No nausea/vomiting.   LVAD INTERROGATION:  HeartMate II LVAD:  Flow 4.1 liters/min, speed 5500, power 4.0, PI 3.3. No alarms     Objective:    Vital Signs:   Temp:  [98.4 F (36.9 C)-101.4 F (38.6 C)] 99.1 F (37.3 C) (04/14 0725) Pulse Rate:  [79-94] 88 (04/14 0354) Resp:  [18-27] 20 (04/14 0725) BP: (91-111)/(60-89) 103/78 (04/14 0725) SpO2:  [95 %-99 %] 96 % (04/14 0725) Weight:  [178 lb 1.6 oz (80.8 kg)] 178 lb 1.6 oz (80.8 kg) (04/14 0500) Last BM Date: 12/15/17 Mean arterial Pressure 80s   Intake/Output:   Intake/Output Summary (Last 24 hours) at 12/17/2017 1146 Last data filed at 12/16/2017 1700 Gross per 24 hour  Intake 1007 ml  Output -  Net 1007 ml     Physical Exam    Physical Exam: GENERAL: Well appearing this am. NAD.  HEENT: Normal. NECK: Supple, JVP 7-8 cm. Carotids OK.  CARDIAC:  Mechanical heart sounds with LVAD hum present.  LUNGS:  CTAB, normal effort.  ABDOMEN:  NT, ND, no HSM. No bruits or masses. +BS  LVAD exit site: Well-healed and incorporated. Dressing dry and intact. No erythema or drainage. Stabilization device present and accurately applied. Driveline dressing changed daily per sterile technique. EXTREMITIES:  Warm and dry. No cyanosis, clubbing, rash, or edema.  NEUROLOGIC:  Alert & oriented x 3. Cranial nerves grossly intact. Moves all 4 extremities w/o difficulty. Affect pleasant    Telemetry   NSR 80s with PVCs (personally reviewed).   EKG    No new tracings.    Labs   Basic Metabolic Panel: Recent Labs  Lab 12/11/17 2008  12/13/17 0830 12/14/17 0420 12/15/17 0420 12/16/17 0548 12/17/17 0459  NA 134*   < > 138 136 136 135 135  K 4.2   < > 4.5 4.1 4.2 3.9 4.2  CL 104   < > 104 99* 101  99* 99*  CO2 20*   < > 22 27 24 26 25   GLUCOSE 221*   < > 168* 177* 191* 120* 174*  BUN 64*   < > 49* 21* 45* 28* 48*  CREATININE 8.85*   < > 6.88* 4.17* 6.58* 4.45* 6.32*  CALCIUM 8.2*   < > 8.1* 8.4* 8.7* 8.5* 8.7*  PHOS 5.2*  --   --   --   --   --   --    < > = values in this interval not displayed.    Liver Function Tests: Recent Labs  Lab 12/11/17 2008  ALBUMIN 3.0*   No results for input(s): LIPASE, AMYLASE in the last 168 hours. No results for input(s): AMMONIA in the last 168 hours.  CBC: Recent Labs  Lab 12/13/17 0830 12/14/17 0420 12/15/17 0420 12/16/17 0548 12/17/17 0459  WBC 7.8 7.1 8.5 9.9 11.0*  NEUTROABS  --  4.7 6.1 6.7 7.9*  HGB 9.6* 9.6* 9.4* 9.1* 8.9*  HCT 30.4* 30.4* 29.3* 28.7* 28.0*  MCV 90.2 89.7 89.6 88.9 88.6  PLT 207 199 210 185 200    INR: Recent Labs  Lab 12/13/17 0336 12/14/17 0420 12/15/17 0420 12/16/17 0325 12/17/17 0459  INR 1.19 1.18 1.15 1.27 1.50    Other results:  EKG:    Imaging   No results found.   Medications:     Scheduled Medications: . aspirin EC  81 mg Oral Daily  . atorvastatin  40 mg Oral q1800  . calcitRIOL  0.5 mcg Oral Q M,W,F-HD  . darbepoetin (ARANESP) injection - DIALYSIS  60 mcg Intravenous Q Fri-HD  . docusate sodium  200 mg Oral Daily  . dronabinol  2.5 mg Oral BID AC  . feeding supplement (NEPRO CARB STEADY)  237 mL Oral Daily  . hydrALAZINE  37.5 mg Oral 3 times per day on Sun Tue Thu Sat  . insulin aspart  0-5 Units Subcutaneous QHS  . insulin aspart  0-9 Units Subcutaneous TID WC  . insulin aspart  3 Units Subcutaneous TID WC  . insulin glargine  10 Units Subcutaneous QHS  . metoCLOPramide  5 mg Oral TID AC  . pantoprazole  40 mg Oral Daily  . sildenafil  20 mg Oral TID  . warfarin  10 mg Oral ONCE-1800  . Warfarin - Pharmacist Dosing Inpatient   Does not apply q1800    Infusions: . sodium chloride    . sodium chloride    . sodium chloride    . sodium chloride 10 mL/hr at  12/12/17 1218  . heparin 1,450 Units/hr (12/17/17 1113)    PRN Medications: sodium chloride, sodium chloride, acetaminophen, alteplase, heparin, lidocaine (PF), lidocaine-prilocaine, LORazepam, ondansetron (ZOFRAN) IV, ondansetron, pentafluoroprop-tetrafluoroeth, promethazine **OR** promethazine, sodium chloride flush   Patient Profile   [name] is a 52 y.o. 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   Planned admission for heparin bridge for R AVF repair 12/12/17.  Assessment/Plan:    1. ESRD: Post LVAD placement.  RUE fistula thrombectomy 4/9;.  Concern that this will not stay open due to low flow state.  - Now off amlodipine to promote higher flow through fistula.  - HD today again on Monday.  2. Chronic systolic CHF: Ischemic cardiomyopathy. St Jude ICD. NSTEMI in March 2018 with DES to LAD and RCA, complicated by cardiogenic shock, low output requiring milrinone. Echo 8/18 with EF 20-25%, moderate MR. PX (8/18) with severe functional impairment due to HF. He is now s/p HM3 LVAD placement. LVAD parameters reviewed today and stable, fewer PI events at HD now with lower speed. LDH stable. NYHA II. MAP stable.   - Now off amlodipine,   - Continue ASA 81. On coumadin and heparin. Stop heparin when INR 1.8 or greater (1.5 today, hopefully will be there tomorrow).   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 Stable.  -Continue hydralazine37.5 mg tid on NON-HD days.  -He has been referredto [facility] for evaluation for heart/kidney transplant.  3. CAD: NSTEMI in 3/18. LHC with 99% ulcerated lesion proximal RCA with left to right collaterals, 95% mid LAD stenosis after mid LAD stent. s/p PCI to RCA and LAD on 11/25/16.  - Continue statin and ASA. 4. h/o DVTs: Factor V Leiden heterozygote. - On heparin/warfarin  overlap with low INR.  5. Diabetic gastroparesis: Difficult to manage. Some nausea post-op, no further problems. - Uses Phenergan suppositories as needed.  - Has seen gastroparesis specialist at [facility] and workup is continuing. . - Continue reglan 5 mg TID/AC 6. DM:  SSI while in house.  7. HTN: MAP in the 80s.  8. PAD:  Stable. bilateral disease on ABIs in 8/18. Denies claudication currently, no pedal ulcers.  9. Atrial flutter: Paroxysmal.  - maintaining NSR.   I reviewed the LVAD parameters from today, and compared the results to the patient's prior recorded data.  No programming changes were made.  The LVAD is functioning within specified parameters.  The patient performs LVAD self-test daily.  LVAD interrogation was negative for any significant power changes, alarms or PI events/speed drops.  LVAD equipment check completed and is in good working order.  Back-up equipment present.   LVAD education done on emergency procedures and precautions and reviewed exit site care.  Length of Stay: 6  Loralie Champagne, MD 12/17/2017, 11:46 AM  VAD Team --- VAD ISSUES ONLY--- Pager [phone] (7am - 7am)  Advanced Heart Failure Team  Pager [phone] (M-F; 7a - 4p)  Please contact [facility] for night-coverage after hours (4p -7a ) and weekends on [email or web address]

## 2017-12-18 LAB — BASIC METABOLIC PANEL
Anion gap: 12 (ref 5–15)
BUN: 69 mg/dL — AB (ref 6–20)
CALCIUM: 8.6 mg/dL — AB (ref 8.9–10.3)
CO2: 23 mmol/L (ref 22–32)
Chloride: 101 mmol/L (ref 101–111)
Creatinine, Ser: 7.95 mg/dL — ABNORMAL HIGH (ref 0.61–1.24)
GFR calc Af Amer: 8 mL/min — ABNORMAL LOW (ref >60)
GFR, EST NON AFRICAN AMERICAN: 7 mL/min — AB (ref >60)
GLUCOSE: 145 mg/dL — AB (ref 65–99)
POTASSIUM: 4.1 mmol/L (ref 3.5–5.1)
SODIUM: 136 mmol/L (ref 135–145)

## 2017-12-18 LAB — CBC WITH DIFFERENTIAL/PLATELET
BASOS PCT: 0 %
Basophils Absolute: 0 10*3/uL (ref 0.0–0.1)
EOS ABS: 0.4 10*3/uL (ref 0.0–0.7)
EOS PCT: 4 %
HCT: 26.7 % — ABNORMAL LOW (ref 39.0–52.0)
Hemoglobin: 8.7 g/dL — ABNORMAL LOW (ref 13.0–17.0)
LYMPHS ABS: 1.8 10*3/uL (ref 0.7–4.0)
Lymphocytes Relative: 17 %
MCH: 28.9 pg (ref 26.0–34.0)
MCHC: 32.6 g/dL (ref 30.0–36.0)
MCV: 88.7 fL (ref 78.0–100.0)
MONO ABS: 0.9 10*3/uL (ref 0.1–1.0)
Monocytes Relative: 9 %
NEUTROS PCT: 70 %
Neutro Abs: 7.4 10*3/uL (ref 1.7–7.7)
PLATELETS: 225 10*3/uL (ref 150–400)
RBC: 3.01 MIL/uL — AB (ref 4.22–5.81)
RDW: 17.6 % — AB (ref 11.5–15.5)
WBC: 10.5 10*3/uL (ref 4.0–10.5)

## 2017-12-18 LAB — LACTATE DEHYDROGENASE: LDH: 156 U/L (ref 98–192)

## 2017-12-18 LAB — PHOSPHORUS: Phosphorus: 4.4 mg/dL (ref 2.5–4.6)

## 2017-12-18 LAB — PROTIME-INR
INR: 1.89
PROTHROMBIN TIME: 21.5 s — AB (ref 11.4–15.2)

## 2017-12-18 LAB — GLUCOSE, CAPILLARY
Glucose-Capillary: 130 mg/dL — ABNORMAL HIGH (ref 65–99)
Glucose-Capillary: 184 mg/dL — ABNORMAL HIGH (ref 65–99)

## 2017-12-18 LAB — HEPARIN LEVEL (UNFRACTIONATED): HEPARIN UNFRACTIONATED: 0.26 [IU]/mL — AB (ref 0.30–0.70)

## 2017-12-18 MED ORDER — WARFARIN SODIUM 10 MG PO TABS: 10.0000 mg | ORAL_TABLET | Freq: Once | ORAL | Status: DC

## 2017-12-18 MED ORDER — WARFARIN SODIUM 10 MG PO TABS
10.0000 mg | ORAL_TABLET | Freq: Once | ORAL | Status: AC
  Administered 2017-12-18: 10 mg via ORAL
  Filled 2017-12-18: qty 1

## 2017-12-18 MED ORDER — HEPARIN SODIUM (PORCINE) 1000 UNIT/ML DIALYSIS
8000.0000 [IU] | Freq: Once | INTRAMUSCULAR | Status: AC
  Administered 2017-12-18: 8000 [IU] via INTRAVENOUS_CENTRAL

## 2017-12-18 MED ORDER — HEPARIN SODIUM (PORCINE) 1000 UNIT/ML DIALYSIS: 1000.0000 [IU] | INTRAMUSCULAR | Status: DC | PRN

## 2017-12-18 MED ORDER — HEPARIN (PORCINE) IN NACL 100-0.45 UNIT/ML-% IJ SOLN: 1550.0000 [IU]/h | INTRAMUSCULAR | Status: DC

## 2017-12-18 NOTE — Progress Notes (Signed)
ANTICOAGULATION CONSULT NOTE - Follow Up Consult  Pharmacy Consult for heparin Indication: LVAD  Labs: Recent Labs    12/16/17 0325  12/16/17 0548 12/17/17 0459 12/18/17 0500  HGB  --    < > 9.1* 8.9* 8.7*  HCT  --   --  28.7* 28.0* 26.7*  PLT  --   --  185 200 225  LABPROT 15.8*  --   --  17.9* 21.5*  INR 1.27  --   --  1.50 1.89  HEPARINUNFRC 0.30  --   --  0.25* 0.26*  CREATININE  --   --  4.45* 6.32* 7.95*   < > = values in this interval not displayed.    Assessment: 52yo male subtherapeutic on heparin with initial dosing while Coumadin on hold; patient undergoing RUA AVG thrombectomy revision.  Heparin level this morning was below goal at 0.26, on 1350 units/hr. Hgb is 8.7, plts WNL. LDH is stable. INR is now 1.89. No signs/symptoms of bleeding.    Home regimen: 5 mg daily except 7.5 mg TT  Goal of Therapy:  Heparin level 0.3-0.7 units/ml  INR goal 2-3   Plan:  D/c IV heparin since INR >1.8. Warfarin 10 mg again tonight, then recommend resuming home dose of 5 mg daily with 7.5 mg on Tues/Thurs. Monitor daily heparin level, CBC, INR.    [name], BCPS  Clinical Pharmacist Pager [phone]  12/18/2017 8:40 AM

## 2017-12-18 NOTE — Progress Notes (Signed)
Patient ID: [name], male   DOB: [birth year], 52 y.o.   MRN: [id number]   Advanced Heart Failure VAD Team Note  PCP-Cardiologist: Aundra Dubin  Subjective:    Thrombectomy 4/9.    On coumadin and heparin. INR 1.9.   Denies SOB. No nausea/vomiting.   LVAD INTERROGATION:  HeartMate II LVAD:  Flow 4.0 liters/min, speed 5500, power 4.0, PI 4.0. No alarms     Objective:    Vital Signs:   Temp:  [98 F (36.7 C)-99.1 F (37.3 C)] 98 F (36.7 C) (04/15 0808) Pulse Rate:  [79-91] 79 (04/14 2011) Resp:  [16-18] 18 (04/14 2011) BP: (94-116)/(65-92) 116/92 (04/15 0808) SpO2:  [99 %-100 %] 99 % (04/15 0808) Weight:  [179 lb 11.2 oz (81.5 kg)] 179 lb 11.2 oz (81.5 kg) (04/15 0045) Last BM Date: 12/15/17 Mean arterial Pressure 80s-90s   Intake/Output:   Intake/Output Summary (Last 24 hours) at 12/18/2017 0911 Last data filed at 12/17/2017 1300 Gross per 24 hour  Intake 240 ml  Output -  Net 240 ml     Physical Exam    Physical Exam: GENERAL: Well appearing this am. NAD.  HEENT: Normal. NECK: Supple, JVP 7-8 cm. Carotids OK.  CARDIAC:  Mechanical heart sounds with LVAD hum present.  LUNGS:  CTAB, normal effort.  ABDOMEN:  NT, ND, no HSM. No bruits or masses. +BS  LVAD exit site: Well-healed and incorporated. Dressing dry and intact. No erythema or drainage. Stabilization device present and accurately applied. Driveline dressing changed daily per sterile technique. EXTREMITIES:  Warm and dry. No cyanosis, clubbing, rash, or edema.  NEUROLOGIC:  Alert & oriented x 3. Cranial nerves grossly intact. Moves all 4 extremities w/o difficulty. Affect pleasant    Telemetry   NSR 80s with PVCs (personally reviewed).   EKG    No new tracings.    Labs   Basic Metabolic Panel: Recent Labs  Lab 12/11/17 2008  12/14/17 0420 12/15/17 0420 12/16/17 0548 12/17/17 0459 12/18/17 0500  NA 134*   < > 136 136 135 135 136  K 4.2   < > 4.1 4.2 3.9 4.2 4.1  CL 104   < > 99* 101 99*  99* 101  CO2 20*   < > 27 24 26 25 23   GLUCOSE 221*   < > 177* 191* 120* 174* 145*  BUN 64*   < > 21* 45* 28* 48* 69*  CREATININE 8.85*   < > 4.17* 6.58* 4.45* 6.32* 7.95*  CALCIUM 8.2*   < > 8.4* 8.7* 8.5* 8.7* 8.6*  PHOS 5.2*  --   --   --   --   --   --    < > = values in this interval not displayed.    Liver Function Tests: Recent Labs  Lab 12/11/17 2008  ALBUMIN 3.0*   No results for input(s): LIPASE, AMYLASE in the last 168 hours. No results for input(s): AMMONIA in the last 168 hours.  CBC: Recent Labs  Lab 12/14/17 0420 12/15/17 0420 12/16/17 0548 12/17/17 0459 12/18/17 0500  WBC 7.1 8.5 9.9 11.0* 10.5  NEUTROABS 4.7 6.1 6.7 7.9* PENDING  HGB 9.6* 9.4* 9.1* 8.9* 8.7*  HCT 30.4* 29.3* 28.7* 28.0* 26.7*  MCV 89.7 89.6 88.9 88.6 88.7  PLT 199 210 185 200 225    INR: Recent Labs  Lab 12/14/17 0420 12/15/17 0420 12/16/17 0325 12/17/17 0459 12/18/17 0500  INR 1.18 1.15 1.27 1.50 1.89    Other results:  EKG:  Imaging   No results found.   Medications:     Scheduled Medications: . aspirin EC  81 mg Oral Daily  . atorvastatin  40 mg Oral q1800  . calcitRIOL  0.5 mcg Oral Q M,W,F-HD  . darbepoetin (ARANESP) injection - DIALYSIS  60 mcg Intravenous Q Fri-HD  . docusate sodium  200 mg Oral Daily  . dronabinol  2.5 mg Oral BID AC  . feeding supplement (NEPRO CARB STEADY)  237 mL Oral Daily  . hydrALAZINE  37.5 mg Oral 3 times per day on Sun Tue Thu Sat  . insulin aspart  0-5 Units Subcutaneous QHS  . insulin aspart  0-9 Units Subcutaneous TID WC  . insulin aspart  3 Units Subcutaneous TID WC  . insulin glargine  10 Units Subcutaneous QHS  . metoCLOPramide  5 mg Oral TID AC  . pantoprazole  40 mg Oral Daily  . sildenafil  20 mg Oral TID  . warfarin  10 mg Oral ONCE-1800  . Warfarin - Pharmacist Dosing Inpatient   Does not apply q1800    Infusions: . sodium chloride    . sodium chloride    . sodium chloride    . sodium chloride 10 mL/hr at  12/12/17 1218    PRN Medications: sodium chloride, sodium chloride, acetaminophen, alteplase, heparin, lidocaine (PF), lidocaine-prilocaine, LORazepam, ondansetron (ZOFRAN) IV, ondansetron, pentafluoroprop-tetrafluoroeth, promethazine **OR** promethazine, sodium chloride flush   Patient Profile   Adrin E Borsuk is a 52 y.o. 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   Planned admission for heparin bridge for R AVF repair 12/12/17.  Assessment/Plan:    1. ESRD: Post LVAD placement.  RUE fistula thrombectomy 4/9;.  Concern that this will not stay open due to low flow state.  - Now off amlodipine to promote higher flow through fistula.  - HD today.  2. Chronic systolic CHF: Ischemic cardiomyopathy. St Jude ICD. NSTEMI in March 2018 with DES to LAD and RCA, complicated by cardiogenic shock, low output requiring milrinone. Echo 8/18 with EF 20-25%, moderate MR. PX (8/18) with severe functional impairment due to HF. He is now s/p HM3 LVAD placement. LVAD parameters reviewed today and stable, fewer PI events at HD now with lower speed. LDH stable. NYHA II. MAP stable.   - Now off amlodipine,   - Continue ASA 81. On coumadin and heparin. Can stop heparin gtt today with INR 1.9 and can go home after HD. 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 Stable.  -Continue hydralazine37.5 mg tid on NON-HD days.  -He has been referredto [facility] for evaluation for heart/kidney transplant.  3. CAD: NSTEMI in 3/18. LHC with 99% ulcerated lesion proximal RCA with left to right collaterals, 95% mid LAD stenosis after mid LAD stent. s/p PCI to RCA and LAD on 11/25/16.  - Continue statin and ASA. 4. h/o DVTs: Factor V Leiden heterozygote. - Warfarin.  5. Diabetic gastroparesis: Difficult to manage. Some nausea post-op, no further problems. -  Uses Phenergan suppositories as needed.  - Has seen gastroparesis specialist at [facility] and workup is continuing. . - Continue reglan 5 mg TID/AC 6. DM:  SSI while in house.  7. HTN: MAP in the 80s-90s.  8. PAD:  Stable. bilateral disease on ABIs in 8/18. Denies claudication currently, no pedal ulcers.  9. Atrial flutter:  Paroxysmal.  - maintaining NSR.   Patient can go home today after HD.  Will need followup in LVAD clinic.  Only medication change will be stopping amlodipine to see if this helps increase flow through fistula.  MAP is trending higher so may end up needing to restart on non-HD days.   I reviewed the LVAD parameters from today, and compared the results to the patient's prior recorded data.  No programming changes were made.  The LVAD is functioning within specified parameters.  The patient performs LVAD self-test daily.  LVAD interrogation was negative for any significant power changes, alarms or PI events/speed drops.  LVAD equipment check completed and is in good working order.  Back-up equipment present.   LVAD education done on emergency procedures and precautions and reviewed exit site care.  Length of Stay: 7  Loralie Champagne, MD 12/18/2017, 9:11 AM  VAD Team --- VAD ISSUES ONLY--- Pager [phone] (7am - 7am)  Advanced Heart Failure Team  Pager [phone] (M-F; 7a - 4p)  Please contact [facility] for night-coverage after hours (4p -7a ) and weekends on [email or web address]

## 2017-12-18 NOTE — Progress Notes (Signed)
CARDIAC REHAB PHASE I   PRE:  Rate/Rhythm: 80SR with occ PVC    BP: sitting 110 MAP    SaO2:   MODE:  Ambulation: 340 ft   POST:  Rate/Rhythm: 95 SR    BP: sitting 112     SaO2: 98 RA  Pt able to don batteries and walk independently using his cane. No assist needed. Declined walking farther due to it being a HD day and wants to conserve his energy. He asked about CRPII and sts that he would be interested in doing program in the am before HD. I will refer pt again.  [place], ACSM 12/18/2017 9:29 AM

## 2017-12-18 NOTE — Progress Notes (Addendum)
[facility] Progress Note   Subjective:   Doing well, waiting for HD  Objective Vitals:   12/17/17 2011 12/17/17 2015 12/18/17 0045 12/18/17 0808  BP: 101/82 101/82 103/76 (!) 116/92  Pulse: 79     Resp: 18     Temp: 99.1 F (37.3 C)   98 F (36.7 C)  TempSrc: Oral   Oral  SpO2: 99%   99%  Weight:   81.5 kg (179 lb 11.2 oz)   Height:       Physical Exam General: NAD Heart:RRR, +hum of LVAD Lungs:mostly CTAB Abdomen:soft, NTND Extremities:no edema Dialysis Access: RIJ TDC, RUA AVG +bruit   Filed Weights   12/16/17 0445 12/17/17 0500 12/18/17 0045  Weight: 80.1 kg (176 lb 9.4 oz) 80.8 kg (178 lb 1.6 oz) 81.5 kg (179 lb 11.2 oz)    Intake/Output Summary (Last 24 hours) at 12/18/2017 1028 Last data filed at 12/17/2017 1300 Gross per 24 hour  Intake 240 ml  Output -  Net 240 ml    Additional Objective Labs: Basic Metabolic Panel: Recent Labs  Lab 12/11/17 2008  12/16/17 0548 12/17/17 0459 12/18/17 0500  NA 134*   < > 135 135 136  K 4.2   < > 3.9 4.2 4.1  CL 104   < > 99* 99* 101  CO2 20*   < > 26 25 23   GLUCOSE 221*   < > 120* 174* 145*  BUN 64*   < > 28* 48* 69*  CREATININE 8.85*   < > 4.45* 6.32* 7.95*  CALCIUM 8.2*   < > 8.5* 8.7* 8.6*  PHOS 5.2*  --   --   --   --    < > = values in this interval not displayed.   Liver Function Tests: Recent Labs  Lab 12/11/17 2008  ALBUMIN 3.0*   CBC: Recent Labs  Lab 12/14/17 0420 12/15/17 0420 12/16/17 0548 12/17/17 0459 12/18/17 0500  WBC 7.1 8.5 9.9 11.0* 10.5  NEUTROABS 4.7 6.1 6.7 7.9* 7.4  HGB 9.6* 9.4* 9.1* 8.9* 8.7*  HCT 30.4* 29.3* 28.7* 28.0* 26.7*  MCV 89.7 89.6 88.9 88.6 88.7  PLT 199 210 185 200 225   Blood Culture    Component Value Date/Time   SDES BLOOD LEFT HAND 02/10/2017 1305   SPECREQUEST IN PEDIATRIC BOTTLE Blood Culture adequate volume 02/10/2017 1305   CULT NO GROWTH 5 DAYS 02/10/2017 1305   REPTSTATUS 02/15/2017 FINAL 02/10/2017 1305    CBG: Recent Labs   Lab 12/17/17 0839 12/17/17 1217 12/17/17 1729 12/17/17 2010 12/18/17 0835  GLUCAP 134* 229* 92 103* 130*    Lab Results  Component Value Date   INR 1.89 12/18/2017   INR 1.50 12/17/2017   INR 1.27 12/16/2017   Studies/Results: No results found.  Medications: . sodium chloride    . sodium chloride    . sodium chloride    . sodium chloride 10 mL/hr at 12/12/17 1218   . aspirin EC  81 mg Oral Daily  . atorvastatin  40 mg Oral q1800  . calcitRIOL  0.5 mcg Oral Q M,W,F-HD  . darbepoetin (ARANESP) injection - DIALYSIS  60 mcg Intravenous Q Fri-HD  . docusate sodium  200 mg Oral Daily  . dronabinol  2.5 mg Oral BID AC  . feeding supplement (NEPRO CARB STEADY)  237 mL Oral Daily  . hydrALAZINE  37.5 mg Oral 3 times per day on Sun Tue Thu Sat  . insulin aspart  0-5 Units Subcutaneous  QHS  . insulin aspart  0-9 Units Subcutaneous TID WC  . insulin aspart  3 Units Subcutaneous TID WC  . insulin glargine  10 Units Subcutaneous QHS  . metoCLOPramide  5 mg Oral TID AC  . pantoprazole  40 mg Oral Daily  . sildenafil  20 mg Oral TID  . warfarin  10 mg Oral Once  . Warfarin - Pharmacist Dosing Inpatient   Does not apply q1800    Dialysis Orders: MWF - [facility] 4h 223mn  400/800   3K/ 2Ca   80kg (includes 7lb LVAD battery which is not used while admitted d/t wall hook up)   R IJ TDC/ RUA AVG (Artegraft, 10/31/17, Dr CBridgett Larsson   Hep 8000 bolus - Venofer 569mq wk - last 4/5 - Calcitriol 0.23m8mqHD TIW  Assessment/Plan: 1.  RUA AVG occulusion - per Dr. CheBridgett Larsson mechanical issue for clotting.  Chronic thrombus removed in OR w/o obvious etiology per VVS notes.  VVS suggests it is low flow &/or central venous occulusion. Dr. CheBridgett Larssont optimistic that AVG will remain open & would not attempt repeat declot.  According to the literature, LVAD dialysis patients may not have a good bruit in their access despite normal functioning of the access; this is due to nonpulsatile flow, which is a  consequence of the LVAD technology.  This is why AV fistula's are not attempted usually in LVAD patients due to the need for pulsatile flow for proper maturation. Since AVG declotted on 4/9 & we are unable to rely on good bruit as test for functional AVG, plan is to stick and use AVG today but he does not want to use AVG today b/c Dr. CheBridgett Larssonid he was going to recheck it in 2 weeks.  2. ESRD - Continue at bedside per regular MWF schedule.  Plan to stick AVG if patient permits. K 4.1. Avg UF ~2L.  3. Anemia of CKD- Hgb trending down 9.6> 8.7. Started Aranesp 61m223mwk (Fri) 4. Secondary hyperparathyroidism - Ca in goal. P pending. Cont VDRA. Not on binders. 5. HTN/volume - BP well controlled on hydralazine. Norvasc stopped, given midodrine x1 this admit, not typical for him.  Appears euvolemic on exam, titrate down as tolerated.  6. Nutrition - Renal diet w/fluid restrictions, renavite. 7. Ischemic CM -  s/p LVAD & BiV - ICD - per primary 8. Chronic anticoagulation w/LVAD device - coumadin - heparin bridge, per primary/pharm 9. Limited code - no compressions, everything else yes 10. Dispo - INR 1.89, waiting for INR to be therapeutic, per pt to D/c today post HD.  LindJen Mow[facility]C [facility] Pager: [phone]/2019,10:28 AM  LOS: 7 days   Pt seen, examined and agree w A/P as above.  Rob Kelly SplinterCaroNewell Rubber[employer]ider [phone]/15/2019, 12:41 PM

## 2017-12-18 NOTE — Discharge Summary (Signed)
Advanced Heart Failure Team  Discharge Summary   Patient ID: [name] MRN: [id number], DOB/AGE: [birth year] 52 y.o. Admit date: 12/11/2017 D/C date:     12/18/2017   Primary Discharge Diagnoses:  1. ESRD: Post LVAD placement.  RUE fistula thrombectomy 4/9;.  Concern that this will not stay open due to low flow state.  2. Chronic systolic CHF: Ischemic cardiomyopathy. St Jude ICD. Has LVAD HM3   On coumadin and aspirin.  3. CAD: NSTEMI in 3/18. LHC with 99% ulcerated lesion proximal RCA with left to right collaterals, 95% mid LAD stenosis after mid LAD stent. s/p PCI to RCA and LAD on 11/25/16.  - Continue statin and ASA. 4. h/o DVTs: Factor V Leiden heterozygote. -Warfarin.  5. Diabetic gastroparesis: - UsesPhenergan suppositoriesas needed.  - Has seen gastroparesis specialist at [facility] and workup is continuing. . -Continue reglan 5 mg TID/AC 6. DM  7. HTN 8. PAD:  Stable. bilateral disease on ABIs in 8/18.  9. Atrial flutter:Paroxysmal.   Hospital Course:  [name] a 52 y.o.malewith Chronic systolic CHF due to ICM, s/p HM3 LVAD placement, ESRD, CAD s/p NSTEMI, s/p PCI 11/25/16, h/o DVTs due to Factor V Leidin Heterozygote, Diabetic gastroparesis, DM2, HTN, PAD, and PAFL.   Admitted for heparin bridge for R AVF repair 12/12/17. Heparin and coumadin resumed and he remained hospitalized until INR was therapeutic. From HF/VAD perspective he was stable. Amlodipine was stopped to promote higher flow through graft.  Plan to check INR later this week. He continue on iHD.   1. ESRD: Post LVAD placement.  S/P RUE AVG thrombectomy 4/9;.  Concern that this will not stay open due to low flow state.  - Now off amlodipine to promote higher flow through fistula.  - Remained on HD while hospitalized.  2. Chronic systolic CHF: Ischemic cardiomyopathy. St Jude ICD. NSTEMI in March 2018 with DES to LAD and RCA, complicated by cardiogenic shock, low output requiring  milrinone. Echo 8/18 with EF 20-25%, moderate MR. PX (8/18) with severe functional impairment due to HF. He has HM3 LVAD placement. LVAD parameters reviewed today and stable, fewer PI events at HD now with lower speed. LDH stable. NYHA II. MAP stable.   - Continue ASA 81+ coumadin. Plan for INR check later this week.  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 Stable.  -Continue hydralazine37.5 mg tidon NON-HD days. -He has been referredto [facility] for evaluation for heart/kidney transplant.  3. CAD: NSTEMI in 3/18. LHC with 99% ulcerated lesion proximal RCA with left to right collaterals, 95% mid LAD stenosis after mid LAD stent. s/p PCI to RCA and LAD on 11/25/16.  - Continue statin and ASA. 4. h/o DVTs: Factor V Leiden heterozygote. -Warfarin.  5. Diabetic gastroparesis: Difficult to manage. Some nausea post-op, no further problems. - UsesPhenergan suppositoriesas needed.  - Has seen gastroparesis specialist at [facility] and workup is continuing. . -Continue reglan 5 mg TID/AC 6. DM: SSI while in house. Stable  7. HTN:MAP in the 80s-90s. Amlodipine was stopped .  8. PAD:  Stable. bilateral disease on ABIs in 8/18. Denies claudication currently, no pedal ulcers.  9. Atrial flutter:Paroxysmal.  -Maintained NSR.   LVAD INTERROGATION:  HeartMate II LVAD:  Flow 4.0 liters/min, speed 5500, power 4.0, PI 4.0. No alarms       Discharge Weight: 179 pounds.  Discharge Vitals: Blood pressure (!) 116/92, pulse 79, temperature 98 F (36.7 C), temperature source Oral, resp. rate 18, height  5' 7"  (1.702 m), weight 179 lb 11.2 oz (81.5 kg), SpO2 99 %.  Labs: Lab Results  Component Value Date   WBC 10.5 12/18/2017   HGB 8.7 (L) 12/18/2017   HCT 26.7 (L) 12/18/2017   MCV 88.7 12/18/2017   PLT 225 12/18/2017    Recent Labs  Lab 12/18/17 0500  NA 136  K 4.1  CL 101  CO2 23  BUN 69*  CREATININE 7.95*  CALCIUM 8.6*  GLUCOSE 145*   Lab Results  Component Value  Date   CHOL 128 05/04/2017   HDL 50 05/04/2017   LDLCALC 54 05/04/2017   TRIG 118 05/04/2017   BNP (last 3 results) Recent Labs    06/18/17 2305 06/26/17 0004 07/03/17 0037  BNP 746.0* 1,409.3* 758.9*    ProBNP (last 3 results) No results for input(s): PROBNP in the last 8760 hours.   Diagnostic Studies/Procedures   No results found.  Discharge Medications   Allergies as of 12/18/2017      Reactions   Metformin And Related Diarrhea      Medication List    STOP taking these medications   amLODipine 5 MG tablet Commonly known as:  NORVASC     TAKE these medications   aspirin EC 81 MG tablet Take 1 tablet (81 mg total) by mouth daily.   atorvastatin 40 MG tablet Commonly known as:  LIPITOR Take 1 tablet (40 mg total) daily at 6 PM by mouth.   docusate sodium 100 MG capsule Commonly known as:  COLACE Take 2 capsules (200 mg total) by mouth daily.   dronabinol 2.5 MG capsule Commonly known as:  MARINOL Take 1 capsule (2.5 mg total) by mouth 2 (two) times daily before lunch and supper.   feeding supplement (NEPRO CARB STEADY) Liqd Take 237 mLs by mouth 3 (three) times daily with meals. What changed:  when to take this   [place] 1 reader to the skin every 10 days.   glucose blood test strip Commonly known as:  FREESTYLE PRECISION NEO TEST Use as instructed   hydrALAZINE 25 MG tablet Commonly known as:  APRESOLINE Take 1.5 tablets (37.5 mg total) by mouth 3 (three) times daily. Take on non-hd days   insulin aspart 100 UNIT/ML injection Commonly known as:  novoLOG Inject 3 Units into the skin 3 (three) times daily with meals. What changed:  how much to take   insulin glargine 100 UNIT/ML injection Commonly known as:  LANTUS Inject 0.05 mLs (5 Units total) into the skin daily. What changed:  how much to take   LORazepam 0.5 MG tablet Commonly known as:  ATIVAN Take 1 tablet (0.5 mg total) by mouth every 12 (twelve)  hours as needed for anxiety or sleep.   metoCLOPramide 5 MG tablet Commonly known as:  REGLAN Take 1 tablet (5 mg total) by mouth 4 (four) times daily -  before meals and at bedtime. What changed:    how much to take  when to take this   ondansetron 4 MG disintegrating tablet Commonly known as:  ZOFRAN ODT Take 1 tablet (4 mg total) by mouth every 6 (six) hours as needed for nausea or vomiting.   pantoprazole 40 MG tablet Commonly known as:  PROTONIX Take 40 mg by mouth daily.   promethazine 12.5 MG tablet Commonly known as:  PHENERGAN Take 1 tablet (12.5 mg total) by mouth every 6 (six) hours as needed for nausea or vomiting. What changed:  how  much to take   sildenafil 20 MG tablet Commonly known as:  REVATIO Take 1 tablet (20 mg total) by mouth 3 (three) times daily.   warfarin 5 MG tablet Commonly known as:  COUMADIN Take as directed. If you are unsure how to take this medication, talk to your nurse or doctor. Original instructions:  Take 5 mg (1 tablet) daily except 7.5 mg (1 and 1/2 tablets) on Tuesday/Thursday       Disposition   The patient will be discharged in stable condition to home. Discharge Instructions    (HEART FAILURE PATIENTS) Call MD:  Anytime you have any of the following symptoms: 1) 3 pound weight gain in 24 hours or 5 pounds in 1 week 2) shortness of breath, with or without a dry hacking cough 3) swelling in the hands, feet or stomach 4) if you have to sleep on extra pillows at night in order to breathe.   Complete by:  As directed    Amb Referral to Cardiac Rehabilitation   Complete by:  As directed    Diagnosis:  Heart Failure (see criteria below if ordering Phase II)   Heart Failure Type:  Chronic Systolic & Diastolic   Diet - low sodium heart healthy   Complete by:  As directed    Heart Failure patients record your daily weight using the same scale at the same time of day   Complete by:  As directed    INR  Goal: 2 - 3   Complete by:  As  directed    Goal:  2 - 3   Increase activity slowly   Complete by:  As directed    Page VAD Coordinator at [phone] Speed 5500  Notify for: any VAD alarms, sustained elevations of power >10 watts, sustained drop in Pulse Index <3   Complete by:  As directed    Speed 5500   Notify for:   any VAD alarms sustained elevations of power >10 watts sustained drop in Pulse Index <3     Speed Settings:   Complete by:  As directed    Fixed 5500 RPM Low 5200 RPM     Follow-up Information    Larey Dresser, MD Follow up on 12/26/2017.   Specialty:  Cardiology Why:  at 1000 Contact information: [address] [place] [address] [phone]             Duration of Discharge Encounter: Greater than 35 minutes   Signed, Yannick Steuber   NP-C  12/18/2017, 10:16 AM

## 2017-12-18 NOTE — Progress Notes (Signed)
Patient completed HD trmt without incident.  He refused cannulation of AVG, stating that he preferred to wait until axilla area had healed, per Dr. Lianne Moris instructions.  For discharge this pm.

## 2017-12-18 NOTE — Progress Notes (Signed)
Stopped heparin in the morning, INR went up 1.89 and he knew that he may go home today after hemodialysis. Removed fluid 3L today by hemodialysis. Pt refused to change dressing on RUA AVG area. Removed Lt. IJ and staying on the bed for 30 min. Pt & Pt's daughter received discharge instruction. Pt had coumadin 10 mg. Pt took his all belongings and daughter pick him up. HS [employer]

## 2017-12-18 NOTE — Progress Notes (Signed)
LVAD Coordinator Rounding Note:  Admitted 12/11/17 to Dr. Aundra Dubin for Heparin bridge for arterial venous graft repair per Dr. Bridgett Larsson.   HM III LVAD implanted on 06/12/17 by Dr. Prescott Gum under Destination Therapy criteria due to renal insufficiency.   Vital signs: Temp: 98.3 HR:  83 Doppler Pressure: 106 Automatic BP:  116/92 (101) O2 Sat: 99% on RA Wt: 177>175>177>175>178>179 lbs   LVAD interrogation reveals:  Speed: 5500 Flow: 3.9 Power:  4.0 PI: 4.5 Alarms: none  Events:  none Hematocrit: 30 Fixed speed: 5500 Low speed limit: 5200  Drive Line: left abdominal exit site sorbaview dressing dry and intact; anchor in place and accurately applied. Weekly dressing changes; next dressing change is due 12/19/17.  Labs:  LDH trend: 188>187>172>192>173>156  INR trend: 1.24>1.26>1.19>1.18>1.15>1.89  Anticoagulation Plan: -INR Goal: 2.0 - 3.0 -ASA Dose: 81 mg daily  Device: -St Jude dual -Therapies: on at 200 bpm - Monitor: on VT 150 bpm  Adverse Events on VAD: - ESRD post LVAD; started CVVH 06/15/17; HD started 06/20/17 - Hospitalization 11/5 - 07/16/17 > gastroparesis diagnosis after EGD - Hospitalization 11/26 - 08/04/17 > gastroparesis - Hospitalization 1/8 - 09/16/17>gastroparesis - [facility] to [facility] Corliss Parish [facility] - 10/31/17>rightupper arm arteriovenous graft with Artegraft; ligation of right first stage brachial vein transposition  - 12/11/13 elective admission for thrombectomy for AV graft in RUA   Plan/Recommendations:   1. Pt may discharge home after diaylsis. F/u made for 4/23 at 1000am.   4. Please call VAD coordinator for any VAD equipment or drive line issues.   [name] RN, VAD Coordinator 24/7 VAD Pager: [phone]

## 2017-12-19 ENCOUNTER — Telehealth: Payer: Self-pay | Admitting: Vascular Surgery

## 2017-12-19 LAB — HEPATITIS B SURFACE ANTIGEN: Hepatitis B Surface Ag: NEGATIVE

## 2017-12-19 NOTE — Telephone Encounter (Signed)
-----   Message from Mena Goes, RN sent at 12/15/2017 12:06 PM EDT ----- Regarding: change postop appts   ----- Message ----- From: Conrad Colby, MD Sent: 12/15/2017  11:11 AM To: Vvs Charge Pool  [name] [id number] [birth year]  Change follow up to 2 weeks

## 2017-12-19 NOTE — Telephone Encounter (Signed)
LVM for pts appt 4/24 mld lttr

## 2017-12-20 ENCOUNTER — Inpatient Hospital Stay (HOSPITAL_COMMUNITY)
Admission: EM | Admit: 2017-12-20 | Discharge: 2017-12-22 | DRG: 073 | Disposition: A | Discharge status: Home / Self Care | Payer: BLUE CROSS/BLUE SHIELD | Attending: Cardiology | Admitting: Cardiology

## 2017-12-20 ENCOUNTER — Other Ambulatory Visit: Payer: Self-pay

## 2017-12-20 ENCOUNTER — Encounter (HOSPITAL_COMMUNITY): Payer: Self-pay | Admitting: *Deleted

## 2017-12-20 DIAGNOSIS — E7800 Pure hypercholesterolemia, unspecified: Secondary | ICD-10-CM | POA: Diagnosis present

## 2017-12-20 DIAGNOSIS — I5022 Chronic systolic (congestive) heart failure: Secondary | ICD-10-CM

## 2017-12-20 DIAGNOSIS — D631 Anemia in chronic kidney disease: Secondary | ICD-10-CM | POA: Diagnosis present

## 2017-12-20 DIAGNOSIS — K5090 Crohn's disease, unspecified, without complications: Secondary | ICD-10-CM | POA: Diagnosis present

## 2017-12-20 DIAGNOSIS — E1143 Type 2 diabetes mellitus with diabetic autonomic (poly)neuropathy: Principal | ICD-10-CM | POA: Diagnosis present

## 2017-12-20 DIAGNOSIS — Z888 Allergy status to other drugs, medicaments and biological substances status: Secondary | ICD-10-CM

## 2017-12-20 DIAGNOSIS — Z955 Presence of coronary angioplasty implant and graft: Secondary | ICD-10-CM

## 2017-12-20 DIAGNOSIS — I132 Hypertensive heart and chronic kidney disease with heart failure and with stage 5 chronic kidney disease, or end stage renal disease: Secondary | ICD-10-CM | POA: Diagnosis present

## 2017-12-20 DIAGNOSIS — N186 End stage renal disease: Secondary | ICD-10-CM | POA: Diagnosis present

## 2017-12-20 DIAGNOSIS — I252 Old myocardial infarction: Secondary | ICD-10-CM | POA: Diagnosis not present

## 2017-12-20 DIAGNOSIS — I255 Ischemic cardiomyopathy: Secondary | ICD-10-CM | POA: Diagnosis present

## 2017-12-20 DIAGNOSIS — K3184 Gastroparesis: Secondary | ICD-10-CM | POA: Diagnosis present

## 2017-12-20 DIAGNOSIS — Z86718 Personal history of other venous thrombosis and embolism: Secondary | ICD-10-CM

## 2017-12-20 DIAGNOSIS — D6851 Activated protein C resistance: Secondary | ICD-10-CM | POA: Diagnosis present

## 2017-12-20 DIAGNOSIS — Z79899 Other long term (current) drug therapy: Secondary | ICD-10-CM

## 2017-12-20 DIAGNOSIS — Z7901 Long term (current) use of anticoagulants: Secondary | ICD-10-CM | POA: Diagnosis not present

## 2017-12-20 DIAGNOSIS — Z7982 Long term (current) use of aspirin: Secondary | ICD-10-CM | POA: Diagnosis not present

## 2017-12-20 DIAGNOSIS — E1151 Type 2 diabetes mellitus with diabetic peripheral angiopathy without gangrene: Secondary | ICD-10-CM | POA: Diagnosis present

## 2017-12-20 DIAGNOSIS — N2581 Secondary hyperparathyroidism of renal origin: Secondary | ICD-10-CM | POA: Diagnosis present

## 2017-12-20 DIAGNOSIS — K219 Gastro-esophageal reflux disease without esophagitis: Secondary | ICD-10-CM | POA: Diagnosis present

## 2017-12-20 DIAGNOSIS — R112 Nausea with vomiting, unspecified: Secondary | ICD-10-CM | POA: Diagnosis not present

## 2017-12-20 DIAGNOSIS — G43A1 Cyclical vomiting, intractable: Secondary | ICD-10-CM | POA: Diagnosis not present

## 2017-12-20 DIAGNOSIS — I480 Paroxysmal atrial fibrillation: Secondary | ICD-10-CM | POA: Diagnosis present

## 2017-12-20 DIAGNOSIS — Z794 Long term (current) use of insulin: Secondary | ICD-10-CM | POA: Diagnosis not present

## 2017-12-20 DIAGNOSIS — Z992 Dependence on renal dialysis: Secondary | ICD-10-CM | POA: Diagnosis not present

## 2017-12-20 DIAGNOSIS — Z95811 Presence of heart assist device: Secondary | ICD-10-CM | POA: Diagnosis not present

## 2017-12-20 DIAGNOSIS — I2510 Atherosclerotic heart disease of native coronary artery without angina pectoris: Secondary | ICD-10-CM | POA: Diagnosis present

## 2017-12-20 LAB — BASIC METABOLIC PANEL
ANION GAP: 17 — AB (ref 5–15)
BUN: 66 mg/dL — ABNORMAL HIGH (ref 6–20)
CALCIUM: 9 mg/dL (ref 8.9–10.3)
CO2: 19 mmol/L — AB (ref 22–32)
CREATININE: 8.36 mg/dL — AB (ref 0.61–1.24)
Chloride: 102 mmol/L (ref 101–111)
GFR, EST AFRICAN AMERICAN: 7 mL/min — AB (ref >60)
GFR, EST NON AFRICAN AMERICAN: 6 mL/min — AB (ref >60)
Glucose, Bld: 164 mg/dL — ABNORMAL HIGH (ref 65–99)
Potassium: 5.8 mmol/L — ABNORMAL HIGH (ref 3.5–5.1)
SODIUM: 138 mmol/L (ref 135–145)

## 2017-12-20 LAB — COMPREHENSIVE METABOLIC PANEL
ALK PHOS: 104 U/L (ref 38–126)
ALT: 17 U/L (ref 17–63)
AST: 24 U/L (ref 15–41)
Albumin: 3.4 g/dL — ABNORMAL LOW (ref 3.5–5.0)
Anion gap: 16 — ABNORMAL HIGH (ref 5–15)
BILIRUBIN TOTAL: 0.5 mg/dL (ref 0.3–1.2)
BUN: 60 mg/dL — AB (ref 6–20)
CALCIUM: 9 mg/dL (ref 8.9–10.3)
CO2: 19 mmol/L — ABNORMAL LOW (ref 22–32)
CREATININE: 8.06 mg/dL — AB (ref 0.61–1.24)
Chloride: 103 mmol/L (ref 101–111)
GFR, EST AFRICAN AMERICAN: 8 mL/min — AB (ref >60)
GFR, EST NON AFRICAN AMERICAN: 7 mL/min — AB (ref >60)
Glucose, Bld: 123 mg/dL — ABNORMAL HIGH (ref 65–99)
Potassium: 5.1 mmol/L (ref 3.5–5.1)
Sodium: 138 mmol/L (ref 135–145)
Total Protein: 7.6 g/dL (ref 6.5–8.1)

## 2017-12-20 LAB — CBC
HEMATOCRIT: 31.1 % — AB (ref 39.0–52.0)
HEMATOCRIT: 28.9 % — AB (ref 39.0–52.0)
HEMOGLOBIN: 9.9 g/dL — AB (ref 13.0–17.0)
Hemoglobin: 9.4 g/dL — ABNORMAL LOW (ref 13.0–17.0)
MCH: 28.3 pg (ref 26.0–34.0)
MCH: 28.7 pg (ref 26.0–34.0)
MCHC: 31.8 g/dL (ref 30.0–36.0)
MCHC: 32.5 g/dL (ref 30.0–36.0)
MCV: 88.9 fL (ref 78.0–100.0)
MCV: 88.1 fL (ref 78.0–100.0)
Platelets: 302 10*3/uL (ref 150–400)
Platelets: 304 10*3/uL (ref 150–400)
RBC: 3.5 MIL/uL — AB (ref 4.22–5.81)
RBC: 3.28 MIL/uL — AB (ref 4.22–5.81)
RDW: 17.2 % — ABNORMAL HIGH (ref 11.5–15.5)
RDW: 17.6 % — ABNORMAL HIGH (ref 11.5–15.5)
WBC: 18.9 10*3/uL — ABNORMAL HIGH (ref 4.0–10.5)
WBC: 15.9 10*3/uL — AB (ref 4.0–10.5)

## 2017-12-20 LAB — PROTIME-INR
INR: 2.37
PROTHROMBIN TIME: 25.7 s — AB (ref 11.4–15.2)

## 2017-12-20 LAB — GLUCOSE, CAPILLARY: GLUCOSE-CAPILLARY: 173 mg/dL — AB (ref 65–99)

## 2017-12-20 LAB — LACTATE DEHYDROGENASE: LDH: 220 U/L — AB (ref 98–192)

## 2017-12-20 MED ORDER — METOCLOPRAMIDE HCL 10 MG PO TABS
10.0000 mg | ORAL_TABLET | Freq: Three times a day (TID) | ORAL | Status: DC
  Administered 2017-12-20 – 2017-12-22 (×5): 10 mg via ORAL
  Filled 2017-12-20 (×5): qty 1

## 2017-12-20 MED ORDER — PANTOPRAZOLE SODIUM 40 MG PO TBEC
40.0000 mg | DELAYED_RELEASE_TABLET | Freq: Every day | ORAL | Status: DC
  Administered 2017-12-21 – 2017-12-22 (×2): 40 mg via ORAL
  Filled 2017-12-20 (×2): qty 1

## 2017-12-20 MED ORDER — PROMETHAZINE HCL 25 MG RE SUPP
25.0000 mg | Freq: Four times a day (QID) | RECTAL | Status: DC | PRN
  Filled 2017-12-20: qty 1

## 2017-12-20 MED ORDER — SILDENAFIL CITRATE 20 MG PO TABS
20.0000 mg | ORAL_TABLET | Freq: Three times a day (TID) | ORAL | Status: DC
  Administered 2017-12-21 – 2017-12-22 (×4): 20 mg via ORAL
  Filled 2017-12-20 (×4): qty 1

## 2017-12-20 MED ORDER — LIDOCAINE HCL (PF) 1 % IJ SOLN: 5.0000 mL | INTRAMUSCULAR | Status: DC | PRN

## 2017-12-20 MED ORDER — INSULIN GLARGINE 100 UNIT/ML ~~LOC~~ SOLN
5.0000 [IU] | Freq: Every day | SUBCUTANEOUS | Status: DC
  Administered 2017-12-20 – 2017-12-22 (×3): 5 [IU] via SUBCUTANEOUS
  Filled 2017-12-20 (×3): qty 0.05

## 2017-12-20 MED ORDER — PROMETHAZINE HCL 25 MG PO TABS: 12.5000 mg | ORAL_TABLET | Freq: Four times a day (QID) | ORAL | Status: DC | PRN

## 2017-12-20 MED ORDER — HYDRALAZINE HCL 25 MG PO TABS
37.5000 mg | ORAL_TABLET | Freq: Three times a day (TID) | ORAL | Status: DC
  Administered 2017-12-20: 37.5 mg via ORAL
  Filled 2017-12-20 (×7): qty 1.5

## 2017-12-20 MED ORDER — PENTAFLUOROPROP-TETRAFLUOROETH EX AERO: 1.0000 "application " | INHALATION_SPRAY | CUTANEOUS | Status: DC | PRN

## 2017-12-20 MED ORDER — HEPARIN SODIUM (PORCINE) 1000 UNIT/ML DIALYSIS: 1000.0000 [IU] | INTRAMUSCULAR | Status: DC | PRN

## 2017-12-20 MED ORDER — ACETAMINOPHEN 325 MG PO TABS: 650.0000 mg | ORAL_TABLET | ORAL | Status: DC | PRN

## 2017-12-20 MED ORDER — INSULIN ASPART 100 UNIT/ML ~~LOC~~ SOLN: 3.0000 [IU] | Freq: Three times a day (TID) | SUBCUTANEOUS | Status: DC

## 2017-12-20 MED ORDER — PROMETHAZINE HCL 25 MG/ML IJ SOLN
25.0000 mg | Freq: Four times a day (QID) | INTRAMUSCULAR | Status: DC | PRN
  Administered 2017-12-20 – 2017-12-21 (×4): 25 mg via INTRAVENOUS
  Filled 2017-12-20 (×4): qty 1

## 2017-12-20 MED ORDER — HEPARIN SODIUM (PORCINE) 1000 UNIT/ML DIALYSIS: 20.0000 [IU]/kg | INTRAMUSCULAR | Status: DC | PRN

## 2017-12-20 MED ORDER — HYDRALAZINE HCL 20 MG/ML IJ SOLN
10.0000 mg | Freq: Four times a day (QID) | INTRAMUSCULAR | Status: DC | PRN
  Administered 2017-12-20: 10 mg via INTRAVENOUS
  Filled 2017-12-20 (×2): qty 1

## 2017-12-20 MED ORDER — LORAZEPAM 0.5 MG PO TABS
0.5000 mg | ORAL_TABLET | Freq: Two times a day (BID) | ORAL | Status: DC | PRN
Start: 2017-12-20 — End: 2017-12-22
  Administered 2017-12-20: 0.5 mg via ORAL
  Filled 2017-12-20 (×2): qty 1

## 2017-12-20 MED ORDER — LIDOCAINE-PRILOCAINE 2.5-2.5 % EX CREA: 1.0000 "application " | TOPICAL_CREAM | CUTANEOUS | Status: DC | PRN

## 2017-12-20 MED ORDER — SODIUM CHLORIDE 0.9 % IV SOLN: 100.0000 mL | INTRAVENOUS | Status: DC | PRN

## 2017-12-20 MED ORDER — WARFARIN SODIUM 5 MG PO TABS
5.0000 mg | ORAL_TABLET | ORAL | Status: AC
  Administered 2017-12-20: 5 mg via ORAL
  Filled 2017-12-20: qty 1

## 2017-12-20 MED ORDER — ALTEPLASE 2 MG IJ SOLR: 2.0000 mg | Freq: Once | INTRAMUSCULAR | Status: DC | PRN

## 2017-12-20 MED ORDER — ASPIRIN EC 81 MG PO TBEC
81.0000 mg | DELAYED_RELEASE_TABLET | Freq: Every day | ORAL | Status: DC
  Administered 2017-12-21 – 2017-12-22 (×2): 81 mg via ORAL
  Filled 2017-12-20 (×2): qty 1

## 2017-12-20 MED ORDER — ONDANSETRON HCL 4 MG/2ML IJ SOLN: 4.0000 mg | Freq: Four times a day (QID) | INTRAMUSCULAR | Status: DC | PRN

## 2017-12-20 MED ORDER — WARFARIN - PHARMACIST DOSING INPATIENT
Freq: Every day | Status: DC
Start: 2017-12-21 — End: 2017-12-22
  Administered 2017-12-21: 18:00:00

## 2017-12-20 MED ORDER — ATORVASTATIN CALCIUM 40 MG PO TABS
40.0000 mg | ORAL_TABLET | Freq: Every day | ORAL | Status: DC
  Administered 2017-12-21: 40 mg via ORAL
  Filled 2017-12-20: qty 1

## 2017-12-20 MED ORDER — ONDANSETRON 4 MG PO TBDP: 4.0000 mg | ORAL_TABLET | Freq: Four times a day (QID) | ORAL | Status: DC | PRN

## 2017-12-20 MED ORDER — DRONABINOL 2.5 MG PO CAPS
2.5000 mg | ORAL_CAPSULE | Freq: Two times a day (BID) | ORAL | Status: DC
  Administered 2017-12-21 (×2): 2.5 mg via ORAL
  Filled 2017-12-20 (×2): qty 1

## 2017-12-20 NOTE — ED Notes (Signed)
IV team at bedside 

## 2017-12-20 NOTE — H&P (Addendum)
Advanced Heart Failure VAD History and Physical Note   PCP-Cardiologist: No primary care provider on file.   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  Discharged earlier this week after attempted repair of R AVF. Admitted for heparin bridge followed by resumption of coumadin. Once INR therapeutic he was discharged. HF/VAD he was stable. Amlodipine was stopped to allow for higher flow through graft.  He has been seen by GI. In November he was placed on erythromycin for 7 days + reglan + protonix + phenergan as needed.   Over the last few days he has felt fine but earlier this morning he developed nausea.   Today he presented to hemodialysis and had intractable nausea and vomiting. Says he did not have time to take phenergan prior to hemodialysis. The dialysis called EMS and sent him to the ED for an evaluation. He has not been able to take oral medication today. Denies bleeding problems.   In the ED feeling very poorly. Continuous wretching and ab discomfort he was given IV phenergan with no relief. Also had 2 BMs. No fevers or chills. Denies diarrhea  LVAD INTERROGATION:  HeartMate II LVAD:  Flow 3.6 liters/min, speed 5500, power4, PI 4.6.  Personally reviewed    Review of Systems: [y] = yes, [ ]  = no   General: Weight gain [ ] ; Weight loss [ ] ; Anorexia [ ] ; Fatigue Blue.Reese ]; Fever [ ] ; Chills [ ] ; Weakness Blue.Reese ]  Cardiac: Chest pain/pressure [ ] ; Resting SOB [ ] ; Exertional SOB [ ] ; Orthopnea [ ] ; Pedal Edema [ ] ; Palpitations [ ] ; Syncope [ ] ; Presyncope [ ] ; Paroxysmal nocturnal dyspnea[ ]   Pulmonary: Cough [ ] ; Wheezing[ ] ; Hemoptysis[ ] ; Sputum [ ] ; Snoring [ ]   GI: Vomiting[y ]; Dysphagia[ ] ; Melena[ ] ; Hematochezia [ ] ; Heartburn[ ] ;  Abdominal pain [ ] ; Constipation [ ] ; Diarrhea [ y]; BRBPR [ ]   GU: Hematuria[ ] ; Dysuria [ ] ; Nocturia[ ]   Vascular: Pain in legs with walking [ ] ; Pain in feet with lying flat [ ] ; Non-healing sores [ ] ; Stroke [ ] ; TIA [ ] ; Slurred speech [ ] ;  Neuro: Headaches[ ] ; Vertigo[ ] ; Seizures[ ] ; Paresthesias[ ] ;Blurred vision [ ] ; Diplopia [ ] ; Vision changes [ ]   Ortho/Skin: Arthritis [ ] ; Joint pain [ ] ; Muscle pain [ ] ; Joint swelling [ ] ; Back Pain [ ] ; Rash [ ]   Psych: Depression[ ] ; Anxiety[ ]   Heme: Bleeding problems [ ] ; Clotting disorders [ ] ; Anemia [ ]   Endocrine: Diabetes [ y]; Thyroid dysfunction[ ]     Home Medications Prior to Admission medications   Medication Sig Start Date End Date Taking? Authorizing Provider  aspirin EC 81 MG tablet Take 1 tablet (81 mg total) by mouth daily. 07/26/17   Larey Dresser, MD  atorvastatin (LIPITOR) 40 MG tablet Take 1 tablet (40 mg total) daily at 6 PM by mouth. 07/16/17   Bhagat, Bhavinkumar, PA  Continuous Blood Gluc Sensor ([place]) MISC Apply 1 reader to the skin every 10 days. Patient not taking: Reported on 12/05/2017 08/17/17   Zenia Resides, MD  docusate sodium (COLACE) 100 MG capsule Take 2 capsules (200 mg total) by mouth daily. 07/03/17   Clegg,  Amy D, NP  dronabinol (MARINOL) 2.5 MG capsule Take 1 capsule (2.5 mg total) by mouth 2 (two) times daily before lunch and supper. 09/16/17   Daune Perch, NP  glucose blood (FREESTYLE PRECISION NEO TEST) test strip Use as instructed Patient not taking: Reported on 12/05/2017 08/17/17   Zenia Resides, MD  hydrALAZINE (APRESOLINE) 25 MG tablet Take 1.5 tablets (37.5 mg total) by mouth 3 (three) times daily. Take on non-hd days 10/24/17   Larey Dresser, MD  insulin aspart (NOVOLOG) 100 UNIT/ML injection Inject 3 Units into the skin 3 (three) times daily with meals. Patient taking differently: Inject 5 Units into the skin 3 (three) times daily with meals.  07/03/17    Clegg, Amy D, NP  insulin glargine (LANTUS) 100 UNIT/ML injection Inject 0.05 mLs (5 Units total) into the skin daily. Patient taking differently: Inject 10 Units into the skin daily.  07/03/17   Clegg, Amy D, NP  LORazepam (ATIVAN) 0.5 MG tablet Take 1 tablet (0.5 mg total) by mouth every 12 (twelve) hours as needed for anxiety or sleep. 08/04/17   Clegg, Amy D, NP  metoCLOPramide (REGLAN) 5 MG tablet Take 1 tablet (5 mg total) by mouth 4 (four) times daily -  before meals and at bedtime. Patient taking differently: Take 10 mg by mouth 3 (three) times daily.  08/04/17   Clegg, Amy D, NP  Nutritional Supplements (FEEDING SUPPLEMENT, NEPRO CARB STEADY,) LIQD Take 237 mLs by mouth 3 (three) times daily with meals. Patient taking differently: Take 237 mLs by mouth daily.  09/16/17   Daune Perch, NP  ondansetron (ZOFRAN ODT) 4 MG disintegrating tablet Take 1 tablet (4 mg total) by mouth every 6 (six) hours as needed for nausea or vomiting. 01/10/17   Esterwood, Amy S, PA-C  pantoprazole (PROTONIX) 40 MG tablet Take 40 mg by mouth daily.    [provider]  promethazine (PHENERGAN) 12.5 MG tablet Take 1 tablet (12.5 mg total) by mouth every 6 (six) hours as needed for nausea or vomiting. Patient taking differently: Take 25 mg by mouth every 6 (six) hours as needed for nausea or vomiting.  09/16/17   Daune Perch, NP  sildenafil (REVATIO) 20 MG tablet Take 1 tablet (20 mg total) by mouth 3 (three) times daily. 07/26/17   Larey Dresser, MD  warfarin (COUMADIN) 5 MG tablet Take 5 mg (1 tablet) daily except 7.5 mg (1 and 1/2 tablets) on Tuesday/Thursday    [provider]    Past Medical History: Past Medical History:  Diagnosis Date  . Angina   . ASCVD (arteriosclerotic cardiovascular disease)    , Anterior infarction 2005, LAD diagonal bifurcation intervention 03/2004  . Automatic implantable cardiac defibrillator -St. Jude's       . Benign neoplasm of colon   . CHF  (congestive heart failure) ([place])   . Chronic systolic heart failure ([place])   . Coronary artery disease     Widely patent previously placed stents in the left anterior   . Crohn's disease ([place])   . 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 Dialysis patient ([place])   . Dyspnea   . Gastroparesis   . GERD (gastroesophageal reflux disease)   . High cholesterol   . Hyperlipidemia   . Hypersomnolent    Previous diagnosis of narcolepsy  . Hypertension, essential   . Ischemic cardiomyopathy    Ejection fraction 15-20% catheterization 2010  . Type II or unspecified type diabetes mellitus  without mention of complication, not stated as uncontrolled   . Unspecified gastritis and gastroduodenitis without mention of hemorrhage     Past Surgical History: Past Surgical History:  Procedure Laterality Date  . APPENDECTOMY    . AV FISTULA PLACEMENT Right 06/26/2017   Procedure: ARTERIOVENOUS (AV) FISTULA CREATION VERSUS GRAFT RIGHT  ARM;  Surgeon: Conrad [place], MD;  Location: [facility] OR;  Service: Vascular;  Laterality: Right;  . AV FISTULA PLACEMENT Right 10/31/2017   Procedure: INSERTION OF ARTERIOVENOUS (AV) ARTEGRAFT,  RIGHT UPPER ARM;  Surgeon: Conrad Bronson, MD;  Location: [place];  Service: Vascular;  Laterality: Right;  . CARDIAC DEFIBRILLATOR PLACEMENT  2010   St. Jude ICD  . COLECTOMY  ~ 2003   "for Crohn's" ascending colon.    . CORONARY STENT INTERVENTION N/A 11/25/2016   Procedure: Coronary Stent Intervention;  Surgeon: Leonie Man, MD;  Location: [place] CV LAB;  Service: Cardiovascular;  Laterality: N/A;  . EP IMPLANTABLE DEVICE N/A 09/14/2016   Procedure: ICD Generator Changeout;  Surgeon: Deboraha Sprang, MD;  Location: [place] CV LAB;  Service: Cardiovascular;  Laterality: N/A;  . ESOPHAGOGASTRODUODENOSCOPY N/A 07/12/2017   Procedure: ESOPHAGOGASTRODUODENOSCOPY (EGD);  Surgeon: Jerene Bears, MD;  Location[facility] ENDOSCOPY;  Service: Gastroenterology;  Laterality: N/A;   VAD pt so VAD team will need to accompany pt.   [name] FETAL SURGERY FOR CONGENITAL HERNIA     ????? pt has no knowledge of this..    . INGUINAL HERNIA REPAIR    . INSERTION OF DIALYSIS CATHETER Right 06/26/2017   Procedure: INSERTION OF TUNNELED  DIALYSIS CATHETER RIGHT INTERNAL JUGULAR;  Surgeon: Conrad Lafe, MD;  Location: [place];  Service: Vascular;  Laterality: Right;  . INSERTION OF IMPLANTABLE LEFT VENTRICULAR ASSIST DEVICE N/A 06/12/2017   Procedure: INSERTION OF IMPLANTABLE LEFT VENTRICULAR ASSIST [place];  Surgeon: Ivin Poot, MD;  Location: [place];  Service: Open Heart Surgery;  Laterality: N/A;  HM3 LVAD  CIRC ARREST  NITRIC OXIDE  . IR THORACENTESIS ASP PLEURAL SPACE W/IMG GUIDE  06/28/2017  . LIGATION OF ARTERIOVENOUS  FISTULA Right 10/31/2017   Procedure: LIGATION OF BRACHIO-BASILIC VEIN TRANSPOSITION RIGHT ARM;  Surgeon: Conrad Horry, MD;  Location: [place];  Service: Vascular;  Laterality: Right;  . MULTIPLE EXTRACTIONS WITH ALVEOLOPLASTY N/A 06/08/2017   Procedure: MULTIPLE EXTRACTION WITH ALVEOLOPLASTY AND PRE PROSTHETIC SURGERY AS NEEDED;  Surgeon: Lenn Cal, DDS;  Location: [place];  Service: Oral Surgery;  Laterality: N/A;  . RIGHT HEART CATH N/A 06/07/2017   Procedure: RIGHT HEART CATH;  Surgeon: Larey Dresser, MD;  Location: [place] CV LAB;  Service: Cardiovascular;  Laterality: N/A;  . RIGHT/LEFT HEART CATH AND CORONARY ANGIOGRAPHY N/A 11/23/2016   Procedure: Right/Left Heart Cath and Coronary Angiography;  Surgeon: Larey Dresser, MD;  Location: [place] CV LAB;  Service: Cardiovascular;  Laterality: N/A;  . TEE WITHOUT CARDIOVERSION N/A 06/12/2017   Procedure: TRANSESOPHAGEAL ECHOCARDIOGRAM (TEE);  Surgeon: Prescott Gum, Collier Salina, MD;  Location: [place];  Service: Open Heart Surgery;  Laterality: N/A;  . THROMBECTOMY AND REVISION OF ARTERIOVENTOUS (AV) GORETEX  GRAFT Right 12/12/2017   Procedure: THROMBECTOMY ARTERIOVENTOUS (AV) GORETEX  GRAFT RIGHT  UPPER ARM;  Surgeon: Conrad Vermillion, MD;  Location[facility] OR;  Service: Vascular;  Laterality: Right;    Family History: Family History  Problem Relation Age of Onset  . Colon polyps Mother   . Diabetes Mother   . Heart attack  Father   . Diabetes Father   . Stroke Paternal Grandfather   . Colitis Sister   . Heart disease Brother   . Colitis Sister   . Colon cancer Neg Hx     Social History: Social History   Socioeconomic History  . Marital status: Married    Spouse name: Not on file  . Number of children: Not on file  . Years of education: Not on file  . Highest education level: Not on file  Occupational History  . Not on file  Social Needs  . Financial resource strain: Not on file  . Food insecurity:    Worry: Not on file    Inability: Not on file  . Transportation needs:    Medical: Not on file    Non-medical: Not on file  Tobacco Use  . Smoking status: Never Smoker  . Smokeless tobacco: Never Used  Substance and Sexual Activity  . Alcohol use: No  . Drug use: No  . Sexual activity: Never  Lifestyle  . Physical activity:    Days per week: Not on file    Minutes per session: Not on file  . Stress: Not on file  Relationships  . Social connections:    Talks on phone: Not on file    Gets together: Not on file    Attends religious service: Not on file    Active member of club or organization: Not on file    Attends meetings of clubs or organizations: Not on file    Relationship status: Not on file  Other Topics Concern  . Not on file  Social History Narrative  . Not on file    Allergies:  Allergies  Allergen Reactions  . Metformin And Related Diarrhea    Objective:    Vital Signs:   Temp:  [98.2 F (36.8 C)] 98.2 F (36.8 C) (04/17 1414) Pulse Rate:  [86-90] 86 (04/17 1430) Resp:  [22-29] 29 (04/17 1430) BP: (118-130)/(84-88) 118/84 (04/17 1430) SpO2:  [98 %-100 %] 98 % (04/17 1430)   There were no vitals filed for this visit.  Mean arterial  Pressure 101  Physical Exam    General:  Wretching. Uncomfortable No resp difficulty HEENT: Normal Neck: supple. JVP had to assess . Carotids 2+ bilat; no bruits. No lymphadenopathy or thyromegaly appreciated.  Cor: Mechanical heart sounds with LVAD hum present. R HD catheter.  Lungs: Clear no wheeze Abdomen: soft, nontender, mildly distended. No hepatosplenomegaly. No bruits or masses. Good bowel sounds. Driveline: C/D/I; securement device intact and driveline incorporated Extremities: no cyanosis, clubbing, rash, edema R AVF  Neuro: alert & orientedx3, cranial nerves grossly intact. moves all 4 extremities w/o difficulty. Affect pleasant   Telemetry   NSR 90-100s  Personally reviewed   EKG  n/a  Labs    Basic Metabolic Panel: Recent Labs  Lab 12/14/17 0420 12/15/17 0420 12/16/17 0548 12/17/17 0459 12/18/17 0500 12/18/17 1141  NA 136 136 135 135 136  --   K 4.1 4.2 3.9 4.2 4.1  --   CL 99* 101 99* 99* 101  --   CO2 27 24 26 25 23   --   GLUCOSE 177* 191* 120* 174* 145*  --   BUN 21* 45* 28* 48* 69*  --   CREATININE 4.17* 6.58* 4.45* 6.32* 7.95*  --   CALCIUM 8.4* 8.7* 8.5* 8.7* 8.6*  --   PHOS  --   --   --   --   --  4.4  Liver Function Tests: No results for input(s): AST, ALT, ALKPHOS, BILITOT, PROT, ALBUMIN in the last 168 hours. No results for input(s): LIPASE, AMYLASE in the last 168 hours. No results for input(s): AMMONIA in the last 168 hours.  CBC: Recent Labs  Lab 12/14/17 0420 12/15/17 0420 12/16/17 0548 12/17/17 0459 12/18/17 0500  WBC 7.1 8.5 9.9 11.0* 10.5  NEUTROABS 4.7 6.1 6.7 7.9* 7.4  HGB 9.6* 9.4* 9.1* 8.9* 8.7*  HCT 30.4* 29.3* 28.7* 28.0* 26.7*  MCV 89.7 89.6 88.9 88.6 88.7  PLT 199 210 185 200 225    Cardiac Enzymes: No results for input(s): CKTOTAL, CKMB, CKMBINDEX, TROPONINI in the last 168 hours.  BNP: BNP (last 3 results) Recent Labs    06/18/17 2305 06/26/17 0004 07/03/17 0037  BNP 746.0* 1,409.3* 758.9*     ProBNP (last 3 results) No results for input(s): PROBNP in the last 8760 hours.   CBG: Recent Labs  Lab 12/17/17 1217 12/17/17 1729 12/17/17 2010 12/18/17 0835 12/18/17 1156  GLUCAP 229* 92 103* 130* 184*    Coagulation Studies: Recent Labs    12/18/17 0500  LABPROT 21.5*  INR 1.89    Other results:   Imaging    No results found.    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  Admitted for intractable nausea and vomiting.    Assessment/Plan:     1. Intractable nausea/vomiting In the setting of diabetic gastroparesis In November 2018 GI consulted and he was placed on erythromycin for 7 days + reglan + protonix + phenergan as needed.  Continue ativan, phenergan, and zofran. May need erythromcycin 250 mg tid again if no improvement. Continue reglan.   2. ESRD Did not received dialysis today. T/T/Sat Consult nephrology.  3. Chronic Systolic Heart Failure/HMIII LVAD VAD parameters stable.  Continue 81 mg asa and coumadin. INR pending. [place].  Volume managed per HD Continue hydralazine 37.5 mg tid on non hd day.   4. DM:  Continue home regimen.   5. HTN Give IV hydralazine as needed. Continue current home meds.   6. PAF Maintaining NSR. On coumadin  7. CAD  No s/s ischemia  LHC with 99% ulcerated lesion proximal RCA with left to right collaterals, 95% mid LAD stenosis after mid LAD stent. s/p PCI to RCA and LAD on 11/25/16.  - Continue statin and ASA.  Admit to [facility] . Check CBC, CMET, LDH, INR now.     I reviewed the LVAD parameters from today, and compared the results to the patient's prior recorded data.  No programming changes were made.  The LVAD is functioning within specified parameters.  The patient performs LVAD  self-test daily.  LVAD interrogation was negative for any significant power changes, alarms or PI events/speed drops.  LVAD equipment check completed and is in good working order.  Back-up equipment present.   LVAD education done on emergency procedures and precautions and reviewed exit site care.  Length of Stay: 0  Darrick Grinder, NP 12/20/2017, 4:07 PM  VAD Team Pager [phone] (7am - 7am) +++VAD ISSUES ONLY+++   Advanced Heart Failure Team Pager [phone] (M-F; [place])  Please contact [facility] for night-coverage after hours (4p -7a ) and weekends on [email or web address] for all non- LVAD Issues  Patient seen and examined with Amy  Ninfa Meeker, NP. We discussed all aspects of the encounter. I agree with the assessment and plan as stated above.   He has recurrent intractable n/v.Unable to get him comfortable in ER so will need to be admitted for bowel rest, anti-emetics and and prokinetic agents. Can hydrate gently as needed but need to be careful with ESRD. Renal aware he is here.   Labs reviewed and creatinine quite high - will likely need HD tomorrow. May have a component of uremia but BUN only 60. K 5.1.   VAD interrogated personally. Parameters stable.  [name], MD  9:42 PM

## 2017-12-20 NOTE — ED Provider Notes (Addendum)
[facility] EMERGENCY DEPARTMENT Provider Note   CSN: [id number] Arrival date & time: 12/20/17  1358     History   Chief Complaint Chief Complaint  Patient presents with  . Emesis/LVAD    HPI [name] is a 52 y.o. male.  Patient complains of nausea and vomiting uncontrollably today at the dialysis center.  He has a complex medical history including LVAD device.  Past medical history includes gastroparesis, ESRD, coronary artery disease, CHF, Crohn's disease, diabetes, HTN, many others.  He is unable to keep any fluids down.     Past Medical History:  Diagnosis Date  . Angina   . ASCVD (arteriosclerotic cardiovascular disease)    , Anterior infarction 2005, LAD diagonal bifurcation intervention 03/2004  . Automatic implantable cardiac defibrillator -St. Jude's       . Benign neoplasm of colon   . CHF (congestive heart failure) ([place])   . Chronic systolic heart failure ([place])   . Coronary artery disease     Widely patent previously placed stents in the left anterior   . Crohn's disease ([place])   . 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 Dialysis patient ([place])   . Dyspnea   . Gastroparesis   . GERD (gastroesophageal reflux disease)   . High cholesterol   . Hyperlipidemia   . Hypersomnolent    Previous diagnosis of narcolepsy  . Hypertension, essential   . Ischemic cardiomyopathy    Ejection fraction 15-20% catheterization 2010  . Type II or unspecified type diabetes mellitus without mention of complication, not stated as uncontrolled   . Unspecified gastritis and gastroduodenitis without mention of hemorrhage     Patient Active Problem List   Diagnosis Date Noted  . ESRD (end stage renal disease) ([place]) 12/11/2017  . Left ventricular assist device present ([place]) 12/05/2017  . Chronic kidney disease with end stage renal failure on dialysis ([place]) 10/29/2017  . Nausea with vomiting 09/12/2017  . Intractable nausea and  vomiting 07/31/2017  . 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 LVAD (left ventricular assist device) present ([place])   . Palliative care by specialist   . Chronic systolic CHF (congestive heart failure) ([place]) 06/07/2017  . ACP (advance care planning)   . Goals of care, counseling/discussion   . Diarrhea   . Palliative care encounter   . Nausea   . CHF exacerbation ([place]) 04/05/2017  . Diabetic keto-acidosis ([place]) 04/05/2017  . Type 2 diabetes mellitus with complication, with long-term current use of insulin ([place]) 03/08/2017  . Acute on chronic combined systolic and diastolic CHF (congestive heart failure) ([place]) 02/08/2017  . ESRD on dialysis ([place]) 01/17/2017  . AKI (acute kidney injury) ([place])   . Non-ST elevation (NSTEMI) myocardial infarction ([place])   . CHF (congestive heart failure) ([place]) 09/02/2016  . Elevated serum creatinine   . Encounter for therapeutic drug monitoring 08/03/2016  . Chest pain 04/26/2012  . SVT (supraventricular tachycardia) ([place]) 04/26/2012  . Diabetic gastroparesis ([place]) 11/18/2011  . Nausea and vomiting in adult 11/17/2011  . Coronary artery disease   . Ischemic cardiomyopathy   . Essential hypertension   . Automatic implantable cardioverter-defibrillator in situ   . Deep venous thrombosis ([place])   . Hypersomnolent   . POLYP, COLON 09/25/2008  . Type 2 diabetes mellitus ([place]) 09/25/2008  . Dyslipidemia, goal LDL below 70 09/25/2008  . GASTRITIS 09/25/2008  . DIVERTICULOSIS, COLON 09/25/2008  . RECTAL MASS 09/25/2008  Past Surgical History:  Procedure Laterality Date  . APPENDECTOMY    . AV FISTULA PLACEMENT Right 06/26/2017   Procedure: ARTERIOVENOUS (AV) FISTULA CREATION VERSUS GRAFT RIGHT  ARM;  Surgeon: Conrad [place], MD;  Location: [facility] OR;  Service: Vascular;  Laterality: Right;  . AV FISTULA PLACEMENT Right 10/31/2017   Procedure: INSERTION OF ARTERIOVENOUS (AV) ARTEGRAFT,  RIGHT UPPER ARM;  Surgeon: Conrad Varina,  MD;  Location: [facility];  Service: Vascular;  Laterality: Right;  . CARDIAC DEFIBRILLATOR PLACEMENT  2010   St. Jude ICD  . COLECTOMY  ~ 2003   "for Crohn's" ascending colon.    . CORONARY STENT INTERVENTION N/A 11/25/2016   Procedure: Coronary Stent Intervention;  Surgeon: Leonie Man, MD;  Location: [facility] CV LAB;  Service: Cardiovascular;  Laterality: N/A;  . EP IMPLANTABLE DEVICE N/A 09/14/2016   Procedure: ICD Generator Changeout;  Surgeon: Deboraha Sprang, MD;  Location: [facility] CV LAB;  Service: Cardiovascular;  Laterality: N/A;  . ESOPHAGOGASTRODUODENOSCOPY N/A 07/12/2017   Procedure: ESOPHAGOGASTRODUODENOSCOPY (EGD);  Surgeon: Jerene Bears, MD;  [facility];  Service: Gastroenterology;  Laterality: N/A;  VAD pt so VAD team will need to accompany pt.   [name] FETAL SURGERY FOR CONGENITAL HERNIA     ????? pt has no knowledge of this..    . INGUINAL HERNIA REPAIR    . INSERTION OF DIALYSIS CATHETER Right 06/26/2017   Procedure: INSERTION OF TUNNELED  DIALYSIS CATHETER RIGHT INTERNAL JUGULAR;  Surgeon: Conrad Coweta, MD;  Location: [facility];  Service: Vascular;  Laterality: Right;  . INSERTION OF IMPLANTABLE LEFT VENTRICULAR ASSIST DEVICE N/A 06/12/2017   Procedure: INSERTION OF IMPLANTABLE LEFT VENTRICULAR ASSIST [place] 3;  Surgeon: Ivin Poot, MD;  Location: [facility];  Service: Open Heart Surgery;  Laterality: N/A;  HM3 LVAD  CIRC ARREST  NITRIC OXIDE  . IR THORACENTESIS ASP PLEURAL SPACE W/IMG GUIDE  06/28/2017  . LIGATION OF ARTERIOVENOUS  FISTULA Right 10/31/2017   Procedure: LIGATION OF BRACHIO-BASILIC VEIN TRANSPOSITION RIGHT ARM;  Surgeon: Conrad Glyndon, MD;  Location: [facility];  Service: Vascular;  Laterality: Right;  . MULTIPLE EXTRACTIONS WITH ALVEOLOPLASTY N/A 06/08/2017   Procedure: MULTIPLE EXTRACTION WITH ALVEOLOPLASTY AND PRE PROSTHETIC SURGERY AS NEEDED;  Surgeon: Lenn Cal, DDS;  Location: [facility];  Service: Oral Surgery;  Laterality: N/A;  .  RIGHT HEART CATH N/A 06/07/2017   Procedure: RIGHT HEART CATH;  Surgeon: Larey Dresser, MD;  Location: [facility] CV LAB;  Service: Cardiovascular;  Laterality: N/A;  . RIGHT/LEFT HEART CATH AND CORONARY ANGIOGRAPHY N/A 11/23/2016   Procedure: Right/Left Heart Cath and Coronary Angiography;  Surgeon: Larey Dresser, MD;  Location: [facility] CV LAB;  Service: Cardiovascular;  Laterality: N/A;  . TEE WITHOUT CARDIOVERSION N/A 06/12/2017   Procedure: TRANSESOPHAGEAL ECHOCARDIOGRAM (TEE);  Surgeon: Prescott Gum, Collier Salina, MD;  Location: [facility];  Service: Open Heart Surgery;  Laterality: N/A;  . THROMBECTOMY AND REVISION OF ARTERIOVENTOUS (AV) GORETEX  GRAFT Right 12/12/2017   Procedure: THROMBECTOMY ARTERIOVENTOUS (AV) GORETEX  GRAFT RIGHT UPPER ARM;  Surgeon: Conrad Schoenchen, MD;  Location: [facility];  Service: Vascular;  Laterality: Right;        Home Medications    Prior to Admission medications   Medication Sig Start Date End Date Taking? Authorizing Provider  aspirin EC 81 MG tablet Take 1 tablet (81 mg total) by mouth daily. 07/26/17  Yes Larey Dresser, MD  atorvastatin (LIPITOR) 40 MG  tablet Take 1 tablet (40 mg total) daily at 6 PM by mouth. 07/16/17  Yes Bhagat, Bhavinkumar, PA  docusate sodium (COLACE) 100 MG capsule Take 2 capsules (200 mg total) by mouth daily. 07/03/17  Yes Clegg, Amy D, NP  dronabinol (MARINOL) 2.5 MG capsule Take 1 capsule (2.5 mg total) by mouth 2 (two) times daily before lunch and supper. 09/16/17  Yes Daune Perch, NP  hydrALAZINE (APRESOLINE) 25 MG tablet Take 1.5 tablets (37.5 mg total) by mouth 3 (three) times daily. Take on non-hd days 10/24/17  Yes Larey Dresser, MD  insulin aspart (NOVOLOG) 100 UNIT/ML injection Inject 3 Units into the skin 3 (three) times daily with meals. Patient taking differently: Inject 5 Units into the skin 3 (three) times daily with meals.  07/03/17  Yes Clegg, Amy D, NP  insulin glargine (LANTUS) 100 UNIT/ML injection Inject 0.05 mLs  (5 Units total) into the skin daily. Patient taking differently: Inject 10 Units into the skin daily.  07/03/17  Yes Clegg, Amy D, NP  LORazepam (ATIVAN) 0.5 MG tablet Take 1 tablet (0.5 mg total) by mouth every 12 (twelve) hours as needed for anxiety or sleep. 08/04/17  Yes Clegg, Amy D, NP  metoCLOPramide (REGLAN) 5 MG tablet Take 1 tablet (5 mg total) by mouth 4 (four) times daily -  before meals and at bedtime. Patient taking differently: Take 10 mg by mouth 3 (three) times daily.  08/04/17  Yes Clegg, Amy D, NP  Nutritional Supplements (FEEDING SUPPLEMENT, NEPRO CARB STEADY,) LIQD Take 237 mLs by mouth 3 (three) times daily with meals. Patient taking differently: Take 237 mLs by mouth daily.  09/16/17  Yes Daune Perch, NP  ondansetron (ZOFRAN ODT) 4 MG disintegrating tablet Take 1 tablet (4 mg total) by mouth every 6 (six) hours as needed for nausea or vomiting. 01/10/17  Yes Esterwood, Amy S, PA-C  pantoprazole (PROTONIX) 40 MG tablet Take 40 mg by mouth daily.   Yes [provider]  promethazine (PHENERGAN) 12.5 MG tablet Take 1 tablet (12.5 mg total) by mouth every 6 (six) hours as needed for nausea or vomiting. Patient taking differently: Take 25 mg by mouth every 6 (six) hours as needed for nausea or vomiting.  09/16/17  Yes Daune Perch, NP  sildenafil (REVATIO) 20 MG tablet Take 1 tablet (20 mg total) by mouth 3 (three) times daily. 07/26/17  Yes Larey Dresser, MD  warfarin (COUMADIN) 5 MG tablet Take 5 mg (1 tablet) daily except 7.5 mg (1 and 1/2 tablets) on Tuesday   Yes [provider]    Family History Family History  Problem Relation Age of Onset  . Colon polyps Mother   . Diabetes Mother   . Heart attack Father   . Diabetes Father   . Stroke Paternal Grandfather   . Colitis Sister   . Heart disease Brother   . Colitis Sister   . Colon cancer Neg Hx     Social History Social History   Tobacco Use  . Smoking status: Never Smoker  . Smokeless  tobacco: Never Used  Substance Use Topics  . Alcohol use: No  . Drug use: No     Allergies   Metformin and related   Review of Systems Review of Systems  All other systems reviewed and are negative.    Physical Exam Updated Vital Signs BP 118/84   Pulse 86   Temp 98.2 F (36.8 C) (Oral)   Resp (!) 29   SpO2 98%  Physical Exam  Constitutional: He is oriented to person, place, and time.  Alert, dehydrated  HENT:  Head: Normocephalic and atraumatic.  Eyes: Conjunctivae are normal.  Neck: Neck supple.  Cardiovascular:  Capital LVAD device inserted in right chest wall  Pulmonary/Chest: Effort normal and breath sounds normal.  Abdominal: Soft. Bowel sounds are normal.  Musculoskeletal: Normal range of motion.  Neurological: He is alert and oriented to person, place, and time.  Skin: Skin is warm and dry.  Psychiatric: He has a normal mood and affect. His behavior is normal.  Nursing note and vitals reviewed.    ED Treatments / Results  Labs (all labs ordered are listed, but only abnormal results are displayed) Labs Reviewed  CBC - Abnormal; Notable for the following components:      Result Value   WBC 18.9 (*)    RBC 3.50 (*)    Hemoglobin 9.9 (*)    HCT 31.1 (*)    RDW 17.2 (*)    All other components within normal limits  COMPREHENSIVE METABOLIC PANEL - Abnormal; Notable for the following components:   CO2 19 (*)    Glucose, Bld 123 (*)    BUN 60 (*)    Creatinine, Ser 8.06 (*)    Albumin 3.4 (*)    GFR calc non Af Amer 7 (*)    GFR calc Af Amer 8 (*)    Anion gap 16 (*)    All other components within normal limits  LACTATE DEHYDROGENASE - Abnormal; Notable for the following components:   LDH 220 (*)    All other components within normal limits  PROTIME-INR    EKG None  Radiology No results found.  Procedures Procedures (including critical care time)  Medications Ordered in ED Medications  promethazine (PHENERGAN) injection 25 mg (25 mg  Intravenous Given 12/20/17 1509)  hydrALAZINE (APRESOLINE) injection 10 mg (has no administration in time range)  promethazine (PHENERGAN) suppository 25 mg (has no administration in time range)  LORazepam (ATIVAN) tablet 0.5 mg (has no administration in time range)  metoCLOPramide (REGLAN) tablet 10 mg (has no administration in time range)  ondansetron (ZOFRAN) injection 4 mg (has no administration in time range)     Initial Impression / Assessment and Plan / ED Course  I have reviewed the triage vital signs and the nursing notes.  Pertinent labs & imaging results that were available during my care of the patient were reviewed by me and considered in my medical decision making (see chart for details).     Complex patient with LVAD device presents with nausea and vomiting.  WBC 18.9.  Will start IV fluids, admit to cardiology.  Final Clinical Impressions(s) / ED Diagnoses   Final diagnoses:  Nausea and vomiting, intractability of vomiting not specified, unspecified vomiting type    ED Discharge Orders    None       Nat Christen, MD 12/20/17 1654    Nat Christen, MD 12/20/17 [phone]

## 2017-12-20 NOTE — ED Notes (Signed)
ED Provider at bedside. 

## 2017-12-20 NOTE — ED Triage Notes (Signed)
Pt arrived via [facility] from dialysis center after he began vomiting uncontrollably. Pt is alert and oriented x4. Pt has an LVAD, LVAD RN at bedside shortly after pt arrival. Pt is actively vomiting at time of triage. IV team consult placed due to difficulty of access, placed by LVAD RN. LVAD NP placed order for suppository phenergan.

## 2017-12-20 NOTE — Progress Notes (Addendum)
[facility] Progress Note   Subjective:   Discharged earlier this week after declot of AVF.  Sent from dialysis Unit via EMS due to intractable nausea and vomiting.  Did not receive dialysis, last treatment here on Monday.  Seen and examined at bedside.  States vomiting and chills started this AM.  Reports little PO intake since discharge.  Denies SOB, CP, diarrhea, edema and headache.  Admitted for intractable nausea and vomiting.    Objective Vitals:   12/20/17 1414 12/20/17 1430  BP: 130/88 118/84  Pulse: 90 86  Resp: (!) 22 (!) 29  Temp: 98.2 F (36.8 C)   TempSrc: Oral   SpO2: 100% 98%   Physical Exam General:NAD, chronically ill appearing male, afebrile with vomiting and rigors Heart:regular rate, mechanical heart sounds w/LVAD hum.   Lungs:CTAB Abdomen:soft, NTND Extremities:no LE edema Dialysis Access: R IJ TDC - no erythema or drainage,  RUA AVG +b  There were no vitals filed for this visit. No intake or output data in the 24 hours ending 12/20/17 1658  Additional Objective Labs: Basic Metabolic Panel: Recent Labs  Lab 12/17/17 0459 12/18/17 0500 12/18/17 1141 12/20/17 1550  NA 135 136  --  138  K 4.2 4.1  --  5.1  CL 99* 101  --  103  CO2 25 23  --  19*  GLUCOSE 174* 145*  --  123*  BUN 48* 69*  --  60*  CREATININE 6.32* 7.95*  --  8.06*  CALCIUM 8.7* 8.6*  --  9.0  PHOS  --   --  4.4  --    CBC: Recent Labs  Lab 12/15/17 0420 12/16/17 0548 12/17/17 0459 12/18/17 0500 12/20/17 1550  WBC 8.5 9.9 11.0* 10.5 18.9*  NEUTROABS 6.1 6.7 7.9* 7.4  --   HGB 9.4* 9.1* 8.9* 8.7* 9.9*  HCT 29.3* 28.7* 28.0* 26.7* 31.1*  MCV 89.6 88.9 88.6 88.7 88.9  PLT 210 185 200 225 302   Blood Culture    Component Value Date/Time   SDES BLOOD LEFT HAND 12/17/2017 0828   SPECREQUEST  12/17/2017 0828    BOTTLES DRAWN AEROBIC AND ANAEROBIC Blood Culture adequate volume   CULT  12/17/2017 0828    NO GROWTH 3 DAYS Performed at [facility], [place] [address] [place], [place] [address]    REPTSTATUS PENDING 12/17/2017 0828   CBG: Recent Labs  Lab 12/17/17 1217 12/17/17 1729 12/17/17 2010 12/18/17 0835 12/18/17 1156  GLUCAP 229* 92 103* 130* 184*    Lab Results  Component Value Date   INR 1.89 12/18/2017   INR 1.50 12/17/2017   INR 1.27 12/16/2017   Studies/Results: No results found.  Medications:  . metoCLOPramide  10 mg Oral TID    Dialysis Orders: MWF - [facility] 4h 56mn 400/800 3K/ 2Ca 80kg (includes 7lb LVAD battery which is not used while admitted d/t wall hook up)R IJ TDC/RUAAVG (Artegraft, 10/31/17, Dr CBridgett Larsson Hep 8000 bolus -Venofer 540mq wk - last 4/5 -Calcitriol 0.47m78mqHD TIW   Assessment/Plan: 1. Nausea and vomiting - WBC 18.9, ?infection. per primary 2. ESRD - Today is regular day.  K 5.1 Plan for HD w/minimal UF in the AM to allow for improvement in vomiting.  3. Anemia of CKD- Hgb 9.9, not due to ESA at this time. Last dose 4/12. 4. Secondary hyperparathyroidism - Ca and P in goal. Continue VDRA. 5. HTN/volume - BP a little low.  Appears euvolemic on exam.  Plan  to run even tomorrow or at most UF 0-1L. 6. Nutrition - Alb 3.4. Renal diet w/ fluid restriction once tolerated. Renavite. Nepro 7. Ischemic CM - s/o LDAV & BiV - ICD per primary 8. Chronic anticoagulation w/LVAD device - coumadin 9. Limited code - no compressions, everything else yes  Jen Mow, PA-C [facility] Pager: [phone] 12/20/2017,4:58 PM  LOS: 0 days   Pt seen, examined and agree w A/P as above.  Kelly Splinter MD [employer] pager [phone]   12/21/2017, 8:31 AM

## 2017-12-20 NOTE — Progress Notes (Signed)
ANTICOAGULATION CONSULT NOTE - Initial Consult  Pharmacy Consult for Warfarin Indication: LVAD  Allergies  Allergen Reactions  . Metformin And Related Diarrhea   Vital Signs: Temp: 98.2 F (36.8 C) (04/17 1414) Temp Source: Oral (04/17 1414) BP: 117/85 (04/17 2100) Pulse Rate: 86 (04/17 1430)  Labs: Recent Labs    12/18/17 0500 12/20/17 1550 12/20/17 2119  HGB 8.7* 9.9* 9.4*  HCT 26.7* 31.1* 28.9*  PLT 225 302 304  LABPROT 21.5*  --  25.7*  INR 1.89  --  2.37  HEPARINUNFRC 0.26*  --   --   CREATININE 7.95* 8.06*  --     Estimated Creatinine Clearance: 9.9 mL/min (A) (by C-G formula based on SCr of 8.06 mg/dL (H)).   Medical History: Past Medical History:  Diagnosis Date  . Angina   . ASCVD (arteriosclerotic cardiovascular disease)    , Anterior infarction 2005, LAD diagonal bifurcation intervention 03/2004  . Automatic implantable cardiac defibrillator -St. Jude's       . Benign neoplasm of colon   . CHF (congestive heart failure) ([place])   . Chronic systolic heart failure ([place])   . Coronary artery disease     Widely patent previously placed stents in the left anterior   . Crohn's disease ([place])   . 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 Dialysis patient ([place])   . Dyspnea   . Gastroparesis   . GERD (gastroesophageal reflux disease)   . High cholesterol   . Hyperlipidemia   . Hypersomnolent    Previous diagnosis of narcolepsy  . Hypertension, essential   . Ischemic cardiomyopathy    Ejection fraction 15-20% catheterization 2010  . Type II or unspecified type diabetes mellitus without mention of complication, not stated as uncontrolled   . Unspecified gastritis and gastroduodenitis without mention of hemorrhage     Assessment: 52 year old male to continue warfarin for LVAD INR on admission today = 2.37 Received 10 mg Mon, 7.5 mg Tues  Dose prior to admission = 5 mg daily except 7.5 mg TThur  Goal of Therapy:  INR 2-3 Monitor  platelets by anticoagulation protocol: Yes   Plan:  Warfarin 5 mg po x 1 tonight Daily INR  Thank you Anette Guarneri, PharmD [phone]  12/20/2017,10:20 PM

## 2017-12-20 NOTE — ED Notes (Signed)
Food tray delivered and at bedside.

## 2017-12-20 NOTE — ED Notes (Signed)
Pt inquired about location of his eye glasses while en route to 2C, this RN searched room upon return to ED and phoned [facility] RN to inform them no glasses were found in ED room.

## 2017-12-21 ENCOUNTER — Telehealth (HOSPITAL_COMMUNITY): Payer: Self-pay

## 2017-12-21 DIAGNOSIS — G43A1 Cyclical vomiting, intractable: Secondary | ICD-10-CM

## 2017-12-21 LAB — BASIC METABOLIC PANEL
ANION GAP: 19 — AB (ref 5–15)
BUN: 75 mg/dL — ABNORMAL HIGH (ref 6–20)
CHLORIDE: 99 mmol/L — AB (ref 101–111)
CO2: 19 mmol/L — AB (ref 22–32)
Calcium: 8.9 mg/dL (ref 8.9–10.3)
Creatinine, Ser: 8.69 mg/dL — ABNORMAL HIGH (ref 0.61–1.24)
GFR calc Af Amer: 7 mL/min — ABNORMAL LOW (ref >60)
GFR calc non Af Amer: 6 mL/min — ABNORMAL LOW (ref >60)
GLUCOSE: 211 mg/dL — AB (ref 65–99)
Potassium: 5.1 mmol/L (ref 3.5–5.1)
Sodium: 137 mmol/L (ref 135–145)

## 2017-12-21 LAB — PROTIME-INR
INR: 2.69
Prothrombin Time: 28.3 seconds — ABNORMAL HIGH (ref 11.4–15.2)

## 2017-12-21 LAB — GLUCOSE, CAPILLARY
GLUCOSE-CAPILLARY: 72 mg/dL (ref 65–99)
GLUCOSE-CAPILLARY: 66 mg/dL (ref 65–99)
GLUCOSE-CAPILLARY: 75 mg/dL (ref 65–99)
Glucose-Capillary: 148 mg/dL — ABNORMAL HIGH (ref 65–99)
Glucose-Capillary: 71 mg/dL (ref 65–99)
Glucose-Capillary: 94 mg/dL (ref 65–99)

## 2017-12-21 LAB — CBC
HEMATOCRIT: 29.4 % — AB (ref 39.0–52.0)
HEMOGLOBIN: 9.2 g/dL — AB (ref 13.0–17.0)
MCH: 27.7 pg (ref 26.0–34.0)
MCHC: 31.3 g/dL (ref 30.0–36.0)
MCV: 88.6 fL (ref 78.0–100.0)
Platelets: 343 10*3/uL (ref 150–400)
RBC: 3.32 MIL/uL — ABNORMAL LOW (ref 4.22–5.81)
RDW: 17.4 % — AB (ref 11.5–15.5)
WBC: 13.7 10*3/uL — AB (ref 4.0–10.5)

## 2017-12-21 LAB — LACTATE DEHYDROGENASE: LDH: 199 U/L — ABNORMAL HIGH (ref 98–192)

## 2017-12-21 MED ORDER — NEPRO/CARBSTEADY PO LIQD
237.0000 mL | Freq: Two times a day (BID) | ORAL | Status: DC
  Administered 2017-12-22: 237 mL via ORAL
  Filled 2017-12-21 (×6): qty 237

## 2017-12-21 MED ORDER — DARBEPOETIN ALFA 60 MCG/0.3ML IJ SOSY
60.0000 ug | PREFILLED_SYRINGE | INTRAMUSCULAR | Status: DC
  Administered 2017-12-22: 60 ug via INTRAVENOUS
  Filled 2017-12-21 (×2): qty 0.3

## 2017-12-21 MED ORDER — INSULIN ASPART 100 UNIT/ML ~~LOC~~ SOLN
3.0000 [IU] | Freq: Three times a day (TID) | SUBCUTANEOUS | Status: DC
  Administered 2017-12-22: 3 [IU] via SUBCUTANEOUS

## 2017-12-21 MED ORDER — WARFARIN SODIUM 5 MG PO TABS
5.0000 mg | ORAL_TABLET | Freq: Once | ORAL | Status: AC
  Administered 2017-12-21: 5 mg via ORAL
  Filled 2017-12-21: qty 1

## 2017-12-21 MED ORDER — INSULIN ASPART 100 UNIT/ML ~~LOC~~ SOLN: 0.0000 [IU] | Freq: Three times a day (TID) | SUBCUTANEOUS | Status: DC

## 2017-12-21 MED ORDER — LORAZEPAM 2 MG/ML IJ SOLN
0.5000 mg | Freq: Four times a day (QID) | INTRAMUSCULAR | Status: DC | PRN
  Administered 2017-12-21: 0.5 mg via INTRAVENOUS
  Filled 2017-12-21: qty 1

## 2017-12-21 MED ORDER — INSULIN ASPART 100 UNIT/ML ~~LOC~~ SOLN
0.0000 [IU] | Freq: Three times a day (TID) | SUBCUTANEOUS | Status: DC
  Administered 2017-12-21: 2 [IU] via SUBCUTANEOUS

## 2017-12-21 MED ORDER — CALCITRIOL 0.25 MCG PO CAPS
0.5000 ug | ORAL_CAPSULE | ORAL | Status: DC
  Administered 2017-12-22: 0.5 ug via ORAL
  Filled 2017-12-21: qty 2

## 2017-12-21 MED ORDER — INSULIN ASPART 100 UNIT/ML ~~LOC~~ SOLN: 0.0000 [IU] | Freq: Every day | SUBCUTANEOUS | Status: DC

## 2017-12-21 NOTE — Telephone Encounter (Signed)
UPDATE: Patients insurance is active and benefits verified through [facility] - $30.00 co-pay, no deductible, out of pocket amount of $3,000/$3,000 has been met, no co-insurance, and no pre-authorization is required. Passport/reference [phone]

## 2017-12-21 NOTE — Progress Notes (Signed)
Hypoglycemic Event  CBG: 66  Treatment: 15 GM carbohydrate snack  Symptoms: None  Follow-up CBG: Time: 1829 CBG Result:75  Possible Reasons for Event: Vomiting and Inadequate meal intake  Comments/MD notified    Gustavo Lah

## 2017-12-21 NOTE — Progress Notes (Signed)
Patient ID: [name], male   DOB: Feb 02, 1966, 52 y.o.   MRN: [id number]   Advanced Heart Failure VAD Team Note  PCP-Cardiologist: Aundra Dubin  Subjective:    Nausea improved with meds, has not eaten anything yet.  He had 3 loose BMs yesterday, none today so far. WBCs trending down to 13, no fever.   LVAD INTERROGATION:  HeartMate 3 LVAD:   Flow 4 liters/min, speed 5500, power 4, PI 3.7, no PI events this morning.    Objective:    Vital Signs:   Temp:  [97.7 F (36.5 C)-98.4 F (36.9 C)] 98.4 F (36.9 C) (04/18 0325) Pulse Rate:  [77-100] 100 (04/18 0715) Resp:  [16-29] 21 (04/18 0715) BP: (117-141)/(84-98) 121/94 (04/18 0715) SpO2:  [95 %-100 %] 99 % (04/18 0715) Weight:  [170 lb 3.1 oz (77.2 kg)-172 lb 2.9 oz (78.1 kg)] 172 lb 2.9 oz (78.1 kg) (04/18 0325) Last BM Date: 12/20/17 Mean arterial Pressure 90s-100s (HD day today)  Intake/Output:  No intake or output data in the 24 hours ending 12/21/17 0830   Physical Exam    General:  Well appearing. No resp difficulty HEENT: normal Neck: supple. JVP . Carotids 2+ bilat; no bruits. No lymphadenopathy or thyromegaly appreciated. Cor: Mechanical heart sounds with LVAD hum present. Lungs: clear Abdomen: soft, nontender, nondistended. No hepatosplenomegaly. No bruits or masses. Good bowel sounds. Driveline: C/D/I; securement device intact and driveline incorporated Extremities: no cyanosis, clubbing, rash, edema Neuro: alert & orientedx3, cranial nerves grossly intact. moves all 4 extremities w/o difficulty. Affect pleasant   Telemetry   NSR 80s (personally reviewed)  Labs   Basic Metabolic Panel: Recent Labs  Lab 12/17/17 0459 12/18/17 0500 12/18/17 1141 12/20/17 1550 12/20/17 2120 12/21/17 0237  NA 135 136  --  138 138 137  K 4.2 4.1  --  5.1 5.8* 5.1  CL 99* 101  --  103 102 99*  CO2 25 23  --  19* 19* 19*  GLUCOSE 174* 145*  --  123* 164* 211*  BUN 48* 69*  --  60* 66* 75*  CREATININE 6.32* 7.95*  --   8.06* 8.36* 8.69*  CALCIUM 8.7* 8.6*  --  9.0 9.0 8.9  PHOS  --   --  4.4  --   --   --     Liver Function Tests: Recent Labs  Lab 12/20/17 1550  AST 24  ALT 17  ALKPHOS 104  BILITOT 0.5  PROT 7.6  ALBUMIN 3.4*   No results for input(s): LIPASE, AMYLASE in the last 168 hours. No results for input(s): AMMONIA in the last 168 hours.  CBC: Recent Labs  Lab 12/15/17 0420 12/16/17 0548 12/17/17 0459 12/18/17 0500 12/20/17 1550 12/20/17 2119 12/21/17 0237  WBC 8.5 9.9 11.0* 10.5 18.9* 15.9* 13.7*  NEUTROABS 6.1 6.7 7.9* 7.4  --   --   --   HGB 9.4* 9.1* 8.9* 8.7* 9.9* 9.4* 9.2*  HCT 29.3* 28.7* 28.0* 26.7* 31.1* 28.9* 29.4*  MCV 89.6 88.9 88.6 88.7 88.9 88.1 88.6  PLT 210 185 200 225 302 304 343    INR: Recent Labs  Lab 12/16/17 0325 12/17/17 0459 12/18/17 0500 12/20/17 2119 12/21/17 0237  INR 1.27 1.50 1.89 2.37 2.69    Other results:  EKG:    Imaging    No results found.   Medications:     Scheduled Medications: . aspirin EC  81 mg Oral Daily  . atorvastatin  40 mg Oral q1800  . dronabinol  2.5 mg Oral BID AC  . hydrALAZINE  37.5 mg Oral TID  . insulin aspart  3 Units Subcutaneous TID WC  . insulin glargine  5 Units Subcutaneous Daily  . metoCLOPramide  10 mg Oral TID  . pantoprazole  40 mg Oral Daily  . sildenafil  20 mg Oral TID  . Warfarin - Pharmacist Dosing Inpatient   Does not apply q1800     Infusions: . sodium chloride    . 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  Admitted for intractable nausea and vomiting.   Assessment/Plan:    1. Intractable nausea/vomiting: Suspect due to diabetic gastroparesis (severe).  He is being evaluated for this at [facility]. In November 2018, GI consulted and he was placed on erythromycin for 7 days + reglan + protonix + phenergan as needed. He had episodes of diarrhea yesterday as well.  Mildly elevated WBCs, no fever.  He is feeling better this morning with less nausea.  - Continue ativan, phenergan, Reglan, and zofran. Can make Ativan IV given significant relief.  May need erythromcycin 250 mg tid again if no improvement.  - Given diarrhea, will test for C difficile. Have not gotten a sample yet.  2. ESRD: Missed HD yesterday, to get today.  3. Chronic Systolic Heart Failure/HMIII LVAD: VAD parameters stable. MAP elevated but missed HD yesterday.  - Continue 81 mg asa and coumadin, goal INR 2-2.5.  - Volume managed per HD - Continue hydralazine 37.5 mg tid on non hd days. Will not give additional BP-active meds despite high BP this morning as he is going to get HD now.  - Amlodipine was stopped last admission to try to promote increased flow through his fistula.  4. DM: Continue home regimen.  5. HTN: Can give IV hydralazine as needed. Continue current home meds if he can take po.  No additional BP meds this morning as HD is about to begin.   6. PAF: Maintaining NSR. On coumadin 7. CAD: No chest pain.  LHC with 99% ulcerated lesion proximal RCA with left to right collaterals, 95% mid LAD stenosis after mid LAD stent. s/p PCI to RCA and LAD on 11/25/16.  - Continue statin and ASA.  Hopefully home soon once he can successfully take pos.   I reviewed the LVAD parameters from today, and compared the results to the patient's prior recorded data.  No programming changes were made.  The LVAD is functioning within specified parameters.  The patient performs LVAD self-test  daily.  LVAD interrogation was negative for any significant power changes, alarms or PI events/speed drops.  LVAD equipment check completed and is in good working order.  Back-up equipment present.   LVAD education done on emergency procedures and precautions and reviewed exit site care.  Length of Stay: 1  Loralie Champagne, MD 12/21/2017, 8:30 AM  VAD Team --- VAD ISSUES ONLY--- Pager [phone] (7am - 7am)  Advanced Heart Failure Team  Pager [phone] (M-F; 7a - 4p)  Please contact [facility] for night-coverage after hours (4p -7a ) and weekends on [email or web address]

## 2017-12-21 NOTE — Progress Notes (Signed)
LVAD Coordinator Rounding Note:  Admitted 12/20/17 from the ED for intractable N/V.   HM III LVAD implanted on 06/12/17 by Dr. Prescott Gum under Destination Therapy criteria due to renal insufficiency.   Vital signs: Temp: 98.4 HR:  88 Doppler Pressure: 110 Automatic BP:  121/94 (102) O2 Sat: 99% on RA Wt: 172 lbs   LVAD interrogation reveals:  Speed: 5500 Flow: 3.9 Power:  4.0 PI: 3.6 Alarms: none  Events:  none Hematocrit: 29 Fixed speed: 5500 Low speed limit: 5200  Drive Line: left abdominal exit site sorbaview dressing dry and intact; anchor in place and accurately applied. Weekly dressing changes; next dressing change is due 12/26/17.  Labs:  LDH trend: 199  INR trend: 2.69  Anticoagulation Plan: -INR Goal: 2.0 - 3.0 -ASA Dose: 81 mg daily  Device: -St Jude dual  -Therapies: on at 200 bpm - Monitor: on VT 150 bpm  Adverse Events on VAD: - ESRD post LVAD; started CVVH 06/15/17; HD started 06/20/17 - Hospitalization 11/5 - 07/16/17 > gastroparesis diagnosis after EGD - Hospitalization 11/26 - 08/04/17 > gastroparesis - Hospitalization 1/8 - 09/16/17>gastroparesis - referred to Dr. Corliss Parish at [facility] - 10/31/17>rightupper arm arteriovenous graft with Artegraft; ligation of right first stage brachial vein transposition  - 12/11/13 elective admission for thrombectomy for AV graft in RUA  - 12/20/17 > intractable N/V   Plan/Recommendations:   1. Please call VAD coordinator for any equipment issues or concerns.  [name] RN, VAD Coordinator 24/7 VAD Pager: [phone]

## 2017-12-21 NOTE — Telephone Encounter (Signed)
Reopened referral due to recent hospitalization and new referral placed. Patient was d/c on 12/18/17 and is now back in the hospital. Will continue to follow patient. Patients paperwork in [place] file.

## 2017-12-21 NOTE — Progress Notes (Signed)
ANTICOAGULATION CONSULT NOTE - Initial Consult  Pharmacy Consult for Warfarin Indication: LVAD  Allergies  Allergen Reactions  . Metformin And Related Diarrhea   Vital Signs: Temp: 98.4 F (36.9 C) (04/18 0715) Temp Source: Oral (04/18 0715) BP: 121/94 (04/18 0715) Pulse Rate: 100 (04/18 0715)  Labs: Recent Labs    12/20/17 1550 12/20/17 2119 12/20/17 2120 12/21/17 0237  HGB 9.9* 9.4*  --  9.2*  HCT 31.1* 28.9*  --  29.4*  PLT 302 304  --  343  LABPROT  --  25.7*  --  28.3*  INR  --  2.37  --  2.69  CREATININE 8.06*  --  8.36* 8.69*   Estimated Creatinine Clearance: 9.2 mL/min (A) (by C-G formula based on SCr of 8.69 mg/dL (H)).  Assessment: 66 yoM on warfarin PTA for factor V Leiden and LVAD, pharmacy consulted to continue on admission. INR remains within goal at 2.69 today. Hgb 9.2, pltc WNL - stable. No bleeding noted.  PTA regimen: 5 mg daily except 7.5 mg on Tues and Thurs  Warfarin held during previous admission, then given 7.38m x3 (4/10-4/12), followed by 127mx3 (4/13-4/15) - INR 1.89 on 4/15 >> INR 2.37 on admit 4/17  Goal of Therapy:  INR 2-3 Monitor platelets by anticoagulation protocol: Yes   Plan:  Given recent increased doses of warfarin during previous admission, will give warfarin 5 mg PO x 1 tonight - then anticipate home regimen can be resumed Daily INR Monitor for s/sx of bleeding  Durga Saldarriaga N. DeGerarda FractionPharmD PGY1 Pharmacy Resident Pager: [phone]/18/2019,10:45 AM

## 2017-12-21 NOTE — Progress Notes (Signed)
Arrived to room 2C-04 at 1024.  Reviewed treatment plan and this RN agrees with plan.  Report received from bedside RN, Whitney.  Consent obtained.  Patient A & O X 4.   Lung sounds diminished and clear to ausculation in all fields. No edema. Cardiac:  NSR, LVAD.  Removed caps and cleansed RIJ catheter with chlorhedxidine.  Aspirated ports of heparin and flushed them with saline per protocol.  Connected and secured lines, initiated treatment at 1030.  UF Goal of 1500 mL and net fluid removal 1 L.  Will continue to monitor.

## 2017-12-21 NOTE — Progress Notes (Addendum)
[facility] Progress Note   Subjective:   Felling better, no n/v or chills this AM.  Denies SOB, CP, edema.     Objective Vitals:   12/20/17 2328 12/20/17 2347 12/21/17 0325 12/21/17 0715  BP: 121/87  (!) 134/98 (!) 121/94  Pulse:   92 100  Resp:  19 18 (!) 21  Temp:   98.4 F (36.9 C) 98.4 F (36.9 C)  TempSrc:   Oral Oral  SpO2:   98% 99%  Weight:   78.1 kg (172 lb 2.9 oz)   Height:  5' 7"  (1.702 m)     Physical Exam General:NAD, chronically ill appearing male, laying in bed Heart:regular rate, mechanical heart sounds w/LVAD hum Lungs:CTAB anterior/lateraly Abdomen:soft, NTND Extremities:no LE edema Dialysis Access: R IJ TDC, RUA AVG +b   Filed Weights   12/20/17 2030 12/21/17 0325  Weight: 77.2 kg (170 lb 3.1 oz) 78.1 kg (172 lb 2.9 oz)   No intake or output data in the 24 hours ending 12/21/17 1021  Additional Objective Labs: Basic Metabolic Panel: Recent Labs  Lab 12/18/17 1141 12/20/17 1550 12/20/17 2120 12/21/17 0237  NA  --  138 138 137  K  --  5.1 5.8* 5.1  CL  --  103 102 99*  CO2  --  19* 19* 19*  GLUCOSE  --  123* 164* 211*  BUN  --  60* 66* 75*  CREATININE  --  8.06* 8.36* 8.69*  CALCIUM  --  9.0 9.0 8.9  PHOS 4.4  --   --   --    Liver Function Tests: Recent Labs  Lab 12/20/17 1550  AST 24  ALT 17  ALKPHOS 104  BILITOT 0.5  PROT 7.6  ALBUMIN 3.4*   CBC: Recent Labs  Lab 12/16/17 0548 12/17/17 0459 12/18/17 0500 12/20/17 1550 12/20/17 2119 12/21/17 0237  WBC 9.9 11.0* 10.5 18.9* 15.9* 13.7*  NEUTROABS 6.7 7.9* 7.4  --   --   --   HGB 9.1* 8.9* 8.7* 9.9* 9.4* 9.2*  HCT 28.7* 28.0* 26.7* 31.1* 28.9* 29.4*  MCV 88.9 88.6 88.7 88.9 88.1 88.6  PLT 185 200 225 302 304 343   Blood Culture    Component Value Date/Time   SDES BLOOD LEFT HAND 12/17/2017 0828   SPECREQUEST  12/17/2017 0828    BOTTLES DRAWN AEROBIC AND ANAEROBIC Blood Culture adequate volume   CULT  12/17/2017 0828    NO GROWTH 3 DAYS Performed  at [facility], [address], [place],  [address]    REPTSTATUS PENDING 12/17/2017 0828    CBG: Recent Labs  Lab 12/17/17 2010 12/18/17 0835 12/18/17 1156 12/20/17 2121 12/21/17 0831  GLUCAP 103* 130* 184* 173* 148*    Lab Results  Component Value Date   INR 2.69 12/21/2017   INR 2.37 12/20/2017   INR 1.89 12/18/2017   Studies/Results: No results found.  Medications: . sodium chloride    . sodium chloride     . aspirin EC  81 mg Oral Daily  . atorvastatin  40 mg Oral q1800  . dronabinol  2.5 mg Oral BID AC  . hydrALAZINE  37.5 mg Oral TID  . insulin aspart  0-5 Units Subcutaneous QHS  . insulin aspart  0-9 Units Subcutaneous TID WC  . insulin aspart  3 Units Subcutaneous TID WC  . insulin glargine  5 Units Subcutaneous Daily  . metoCLOPramide  10 mg Oral TID  . pantoprazole  40 mg Oral Daily  .  sildenafil  20 mg Oral TID  . Warfarin - Pharmacist Dosing Inpatient   Does not apply q1800    Dialysis Orders: MWF - [facility] 4h 48mn 400/800 3K/ 2Ca 80kg (includes 7lb LVAD battery which is not used while admitted d/t wall hook up)R IJ TDC/RUAAVG (Artegraft, 10/31/17, Dr CBridgett Larsson Hep 8000 bolus -Venofer 5318mq wk - last 4/5 -Calcitriol 0.18m22mqHD TIW   Assessment/Plan: 1. Nausea and vomiting - Improved w/meds. WBC 18.9>15.9>13.7.  2. ESRD - HD this AM with minimal UF removal.  Plan to resume regular schedule tomorrow.   3. Anemia of CKD- Hgb 9.2,  Start aranesp 43m63mV qwk tomorrow. 4. Secondary hyperparathyroidism - Ca and P in goal. Continue VDRA. Not on binders.  5. HTN/volume - BP a little elevated.  Appears euvolemic on exam. Minimal UF goal today 0-1L, titrate down volume as tolerated. 6. Nutrition - Alb 3.4. Renal diet w/ fluid restriction once tolerated. Renavite. Nepro 7. Ischemic CM - s/o LDAV & BiV - ICD per primary 8. Chronic anticoagulation w/LVAD device - coumadin 9. Limited code - no compressions, everything else  yes    LindJen Mow-C [facility] Pager: [phone]/2019,10:21 AM  LOS: 1 day   Pt seen, examined and agree w A/P as above.  Rob Kelly SplinterCaroNewell Rubbermaider [phone]/18/2019, 11:57 AM

## 2017-12-21 NOTE — Progress Notes (Signed)
Dialysis treatment completed.  1500 mL ultrafiltrated.  1000 mL net fluid removal.  Patient status unchanged. Lung sounds diminished and clear to ausculation in all fields. No edema. Cardiac: NSR.  Cleansed RIJ catheter with chlorhexidine.  Disconnected lines and flushed ports with saline per protocol.  Ports locked with heparin and capped per protocol.    Report given to bedside, RN Whitney.

## 2017-12-21 NOTE — Progress Notes (Signed)
Results for JASHAN, COTTEN (MRN [id number]) as of 12/21/2017 09:13  Ref. Range 12/17/2017 17:29 12/17/2017 20:10 12/18/2017 08:35 12/18/2017 11:56 12/20/2017 21:21  Glucose-Capillary Latest Ref Range: 65 - 99 mg/dL 92 103 (H) 130 (H) 184 (H) 173 (H)  Spoke with staff RN about patient's blood sugars and his eating. Staff RN states that patient is having nausea and vomiting and not eating 50% of meal all the time. Recommend adding Novolog SENSITIVE correction scale TID & HS. The Novolog 3 units TID will be given with meals if patient is eating at least 50% of meal.   Harvel Ricks RN BSN CDE Diabetes Coordinator Pager: [phone]  8am-5pm

## 2017-12-22 LAB — CBC
HCT: 34.2 % — ABNORMAL LOW (ref 39.0–52.0)
Hemoglobin: 10.6 g/dL — ABNORMAL LOW (ref 13.0–17.0)
MCH: 28 pg (ref 26.0–34.0)
MCHC: 31 g/dL (ref 30.0–36.0)
MCV: 90.5 fL (ref 78.0–100.0)
PLATELETS: 323 10*3/uL (ref 150–400)
RBC: 3.78 MIL/uL — ABNORMAL LOW (ref 4.22–5.81)
RDW: 17.5 % — ABNORMAL HIGH (ref 11.5–15.5)
WBC: 8.4 10*3/uL (ref 4.0–10.5)

## 2017-12-22 LAB — BASIC METABOLIC PANEL
Anion gap: 13 (ref 5–15)
BUN: 22 mg/dL — AB (ref 6–20)
CO2: 24 mmol/L (ref 22–32)
CREATININE: 4.61 mg/dL — AB (ref 0.61–1.24)
Calcium: 8.4 mg/dL — ABNORMAL LOW (ref 8.9–10.3)
Chloride: 100 mmol/L — ABNORMAL LOW (ref 101–111)
GFR, EST AFRICAN AMERICAN: 15 mL/min — AB (ref >60)
GFR, EST NON AFRICAN AMERICAN: 13 mL/min — AB (ref >60)
Glucose, Bld: 104 mg/dL — ABNORMAL HIGH (ref 65–99)
POTASSIUM: 3.7 mmol/L (ref 3.5–5.1)
SODIUM: 137 mmol/L (ref 135–145)

## 2017-12-22 LAB — LACTATE DEHYDROGENASE: LDH: 250 U/L — AB (ref 98–192)

## 2017-12-22 LAB — PROTIME-INR
INR: 3.34
PROTHROMBIN TIME: 33.6 s — AB (ref 11.4–15.2)

## 2017-12-22 LAB — GLUCOSE, CAPILLARY
GLUCOSE-CAPILLARY: 142 mg/dL — AB (ref 65–99)
Glucose-Capillary: 116 mg/dL — ABNORMAL HIGH (ref 65–99)

## 2017-12-22 LAB — CULTURE, BLOOD (ROUTINE X 2)
Culture: NO GROWTH
Culture: NO GROWTH
Special Requests: ADEQUATE

## 2017-12-22 NOTE — Progress Notes (Signed)
[name] Progress Note   Subjective:   N/V better. No new c/o.  On HD.    Objective Vitals:   12/22/17 1130 12/22/17 1145 12/22/17 1200 12/22/17 1215  BP: 94/67 (!) 96/40 127/79 (!) 144/64  Pulse: 86 87 84 (!) 28  Resp: (!) 22 17 19 18   Temp:      TempSrc:      SpO2: 100% 96% (!) 89% 98%  Weight:      Height:       Physical Exam General:NAD, chronically ill appearing male, laying in bed Heart:regular rate, mechanical heart sounds w/LVAD hum Lungs:CTAB anterior/lateraly Abdomen:soft, NTND Extremities:no LE edema Dialysis Access: R IJ TDC, RUA AVG +bruit  Dialysis Orders:   Outpatient HD: MWF [facility] 4h 71mn 400/800 3K/ 2Ca 80kg (includes 7lb LVAD battery which is not used while admitted d/t wall hook up)R IJ TDC/RUAAVG (Artegraft, 10/31/17, Dr CBridgett Larsson Hep 8000 bolus -Venofer 572mq wk - last 4/5 -Calcitriol 0.74m58mqHD TIW   Assessment/Plan: 1. Nausea and vomiting - improving  2. ESRD - HD yest and again today to get back on schedule.  Keep even due to N/V and is below dry wt.  3. Anemia of CKD- Hgb 9.2,  Start aranesp 56m56mV qwk tomorrow. 4. Secondary hyperparathyroidism - Ca and P in goal. Continue VDRA. Not on binders.  5. HTN/volume - BP's stable.  6. Nutrition - Alb 3.4. Renal diet w/ fluid restriction once tolerated. Renavite. Nepro 7. Ischemic CM - s/o LDAV & BiV-ICD: per primary 8. Chronic anticoagulation w/LVAD device - coumadin 9. Limited code - no compressions, everything else yes   Rob Kelly SplinterCaroWisdomer [phone]/19/2019, 12:44 PM  Filed Weights   12/21/17 1445 12/22/17 0530 12/22/17 1025  Weight: 77.1 kg (169 lb 15.6 oz) 75.5 kg (166 lb 7.2 oz) 75.5 kg (166 lb 7.2 oz)    Intake/Output Summary (Last 24 hours) at 12/22/2017 1244 Last data filed at 12/21/2017 1900 Gross per 24 hour  Intake 120 ml  Output 1000 ml  Net -880 ml    Additional Objective Labs: Basic Metabolic  Panel: Recent Labs  Lab 12/18/17 1141  12/20/17 2120 12/21/17 0237 12/22/17 0319  NA  --    < > 138 137 137  K  --    < > 5.8* 5.1 3.7  CL  --    < > 102 99* 100*  CO2  --    < > 19* 19* 24  GLUCOSE  --    < > 164* 211* 104*  BUN  --    < > 66* 75* 22*  CREATININE  --    < > 8.36* 8.69* 4.61*  CALCIUM  --    < > 9.0 8.9 8.4*  PHOS 4.4  --   --   --   --    < > = values in this interval not displayed.   Liver Function Tests: Recent Labs  Lab 12/20/17 1550  AST 24  ALT 17  ALKPHOS 104  BILITOT 0.5  PROT 7.6  ALBUMIN 3.4*   CBC: Recent Labs  Lab 12/16/17 0548 12/17/17 0459 12/18/17 0500 12/20/17 1550 12/20/17 2119 12/21/17 0237 12/22/17 0319  WBC 9.9 11.0* 10.5 18.9* 15.9* 13.7* 8.4  NEUTROABS 6.7 7.9* 7.4  --   --   --   --   HGB 9.1* 8.9* 8.7* 9.9* 9.4* 9.2* 10.6*  HCT 28.7* 28.0* 26.7* 31.1* 28.9* 29.4* 34.2*  MCV 88.9 88.6  88.7 88.9 88.1 88.6 90.5  PLT 185 200 225 302 304 343 323   Blood Culture    Component Value Date/Time   SDES BLOOD LEFT HAND 12/17/2017 0828   SPECREQUEST  12/17/2017 0828    BOTTLES DRAWN AEROBIC AND ANAEROBIC Blood Culture adequate volume   CULT  12/17/2017 0828    NO GROWTH 4 DAYS Performed at [facility], [place] [address], [place], [place] [address]    REPTSTATUS PENDING 12/17/2017 0828    CBG: Recent Labs  Lab 12/21/17 1744 12/21/17 1829 12/21/17 2129 12/22/17 0755 12/22/17 1133  GLUCAP 66 75 94 116* 142*    Lab Results  Component Value Date   INR 3.34 12/22/2017   INR 2.69 12/21/2017   INR 2.37 12/20/2017   Studies/Results: No results found.  Medications: . sodium chloride    . sodium chloride     . aspirin EC  81 mg Oral Daily  . atorvastatin  40 mg Oral q1800  . calcitRIOL  0.5 mcg Oral Q M,W,F-HD  . darbepoetin (ARANESP) injection - DIALYSIS  60 mcg Intravenous Q Fri-HD  . dronabinol  2.5 mg Oral BID AC  . feeding supplement (NEPRO CARB STEADY)  237 mL Oral BID BM  . hydrALAZINE  37.5 mg Oral  TID  . insulin aspart  0-5 Units Subcutaneous QHS  . insulin aspart  0-9 Units Subcutaneous TID WC  . insulin aspart  3 Units Subcutaneous TID WC  . insulin glargine  5 Units Subcutaneous Daily  . metoCLOPramide  10 mg Oral TID  . pantoprazole  40 mg Oral Daily  . sildenafil  20 mg Oral TID  . Warfarin - Pharmacist Dosing Inpatient   Does not apply [phone]

## 2017-12-22 NOTE — Progress Notes (Addendum)
[place] for Warfarin Indication: LVAD/Factor V Leiden  Allergies  Allergen Reactions  . Metformin And Related Diarrhea   Vital Signs: Temp: 98 F (36.7 C) (04/19 0700) Temp Source: Oral (04/19 0700) BP: 105/86 (04/19 0700) Pulse Rate: 84 (04/19 0700)  Labs: Recent Labs    12/20/17 2119 12/20/17 2120 12/21/17 0237 12/22/17 0319  HGB 9.4*  --  9.2* 10.6*  HCT 28.9*  --  29.4* 34.2*  PLT 304  --  343 323  LABPROT 25.7*  --  28.3* 33.6*  INR 2.37  --  2.69 3.34  CREATININE  --  8.36* 8.69* 4.61*   Estimated Creatinine Clearance: 17.3 mL/min (A) (by C-G formula based on SCr of 4.61 mg/dL (H)).  Assessment: 32 yoM on warfarin PTA for factor V Leiden and LVAD, pharmacy consulted to continue on admission. INR now above goal this AM at 3.34, still rising quickly from 2.69 yesterday. Hgb 10.6, pltc 250 - stable. No bleeding noted. Patient to be discharged home today after HD.  PTA regimen: 5 mg daily except 7.5 mg on Tues and Thurs  INR therapeutic on admit prior to previous admission, then held - when resumed, given 7.106m x3 (4/10-4/12), followed by 11mx3 (4/13-4/15) - INR 1.89 on 4/15 >> INR 2.37 on admit 4/17  Goal of Therapy:  INR 2-3 Monitor platelets by anticoagulation protocol: Yes   Plan:  Will hold warfarin x1 tonight, then resume previous home regimen (91m29maily except 7.91mg17m TTh) on discharge Daily INR Monitor for s/sx of bleeding  Shanzay Hepworth N. DejaGerarda FractionarmD PGY1 Pharmacy Resident Pager: [phone]/2019,10:00 AM

## 2017-12-22 NOTE — Discharge Summary (Signed)
Advanced Heart Failure Team  Discharge Summary   Patient ID: [name] MRN: [id number], DOB/AGE: 10/29/1965 52 y.o. Admit date: 12/20/2017 D/C date:     12/22/2017   Primary Discharge Diagnoses:  1.  Intractable nausea/vomiting 2. ESRD: Priro to admit t/t/sat 3. Chronic Systolic Heart Failure/HMIII LVAD: VAD parameters stable.   - Continue 81 mg asa and coumadin, goal INR 2-2.5. -He has home INR machine.   4. DM 5. HTN    6. PAF: Maintaining NSR. On coumadin 7. CAD: No chest pain. LHC with 99% ulcerated lesion proximal RCA with left to right collaterals, 95% mid LAD stenosis after mid LAD stent. s/p PCI to RCA and LAD on 11/25/16.   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  Admitted for intractable nausea and vomiting. He received HD the 2 days. From and HF perspective he was stable. He will continue to be followed closely in the HF/VAD clinic. INR adjusted per pharmacy. He will contact VAD clinic on Monday with INR reading.   1.  Intractable nausea/vomiting: Suspect due to diabetic gastroparesis (severe).  He is being evaluated for this at [facility]. In November 2018, GI consulted and he was placed on erythromycin for 7 days + reglan + protonix + phenergan as needed. He had episodes of diarrhea on the day of admission as well.  Mildly elevated WBCs, no fever => WBCs down to normal today.  Nausea/vomiting has resolved, he is eating. He has had no BM since the day of admission.  - He can go home today on his previous gastroparesis regimen.  He needs to followup with the gastroparesis specialist at [facility].  2. ESRD: Had HD 4/18 and 4/19.  3. Chronic Systolic Heart Failure/HMIII LVAD: VAD parameters stable.   - Continue 81 mg asa and coumadin, goal INR 2-2.5.  - Volume  managed per HD - Continue hydralazine 37.5 mg tid on non hd days.  - Amlodipine was stopped last admission to try to promote increased flow through his fistula.  4. DM: Continue home regimen.  5. HTN: BP ok today.   6. PAF: Maintaining NSR. On coumadin 7. CAD: No chest pain. LHC with 99% ulcerated lesion proximal RCA with left to right collaterals, 95% mid LAD stenosis after mid LAD stent. s/p PCI to RCA and LAD on 11/25/16.  - Continue statin and ASA. 8. Disposition: Home today after HD.  He will need followup in LVAD clinic.  He will need to followup with the gastroparesis specialist at [facility].  No changes to his admission home medication regimen.   LVAD INTERROGATION:  HeartMate 3 LVAD:   Flow 4.6 liters/min, speed 5500, power 4.1, PI 2.8, 15-20 PI events/24 hrs.     Discharge Weight: 166 pounds.  Discharge Vitals: Blood pressure 105/86, pulse 84, temperature 98 F (36.7 C), temperature source Oral, resp. rate 18, height 5' 7"  (1.702 m), weight 166 lb 7.2 oz (75.5 kg), SpO2 (!) 76 %.  Labs: Lab Results  Component Value Date   WBC 8.4 12/22/2017   HGB 10.6 (L) 12/22/2017   HCT 34.2 (L) 12/22/2017   MCV 90.5 12/22/2017   PLT 323 12/22/2017    Recent Labs  Lab 12/20/17 1550  12/22/17 0319  NA 138   < > 137  K 5.1   < >  3.7  CL 103   < > 100*  CO2 19*   < > 24  BUN 60*   < > 22*  CREATININE 8.06*   < > 4.61*  CALCIUM 9.0   < > 8.4*  PROT 7.6  --   --   BILITOT 0.5  --   --   ALKPHOS 104  --   --   ALT 17  --   --   AST 24  --   --   GLUCOSE 123*   < > 104*   < > = values in this interval not displayed.   Lab Results  Component Value Date   CHOL 128 05/04/2017   HDL 50 05/04/2017   LDLCALC 54 05/04/2017   TRIG 118 05/04/2017   BNP (last 3 results) Recent Labs    06/18/17 2305 06/26/17 0004 07/03/17 0037  BNP 746.0* 1,409.3* 758.9*    ProBNP (last 3 results) No results for input(s): PROBNP in the last 8760 hours.   Diagnostic Studies/Procedures    No results found.  Discharge Medications   Allergies as of 12/22/2017      Reactions   Metformin And Related Diarrhea      Medication List    TAKE these medications   aspirin EC 81 MG tablet Take 1 tablet (81 mg total) by mouth daily.   atorvastatin 40 MG tablet Commonly known as:  LIPITOR Take 1 tablet (40 mg total) daily at 6 PM by mouth.   docusate sodium 100 MG capsule Commonly known as:  COLACE Take 2 capsules (200 mg total) by mouth daily.   dronabinol 2.5 MG capsule Commonly known as:  MARINOL Take 1 capsule (2.5 mg total) by mouth 2 (two) times daily before lunch and supper.   feeding supplement (NEPRO CARB STEADY) Liqd Take 237 mLs by mouth 3 (three) times daily with meals. What changed:  when to take this   hydrALAZINE 25 MG tablet Commonly known as:  APRESOLINE Take 1.5 tablets (37.5 mg total) by mouth 3 (three) times daily. Take on non-hd days   insulin aspart 100 UNIT/ML injection Commonly known as:  novoLOG Inject 3 Units into the skin 3 (three) times daily with meals. What changed:  how much to take   insulin glargine 100 UNIT/ML injection Commonly known as:  LANTUS Inject 0.05 mLs (5 Units total) into the skin daily. What changed:  how much to take   LORazepam 0.5 MG tablet Commonly known as:  ATIVAN Take 1 tablet (0.5 mg total) by mouth every 12 (twelve) hours as needed for anxiety or sleep.   metoCLOPramide 5 MG tablet Commonly known as:  REGLAN Take 1 tablet (5 mg total) by mouth 4 (four) times daily -  before meals and at bedtime. What changed:    how much to take  when to take this   ondansetron 4 MG disintegrating tablet Commonly known as:  ZOFRAN ODT Take 1 tablet (4 mg total) by mouth every 6 (six) hours as needed for nausea or vomiting.   pantoprazole 40 MG tablet Commonly known as:  PROTONIX Take 40 mg by mouth daily.   promethazine 12.5 MG tablet Commonly known as:  PHENERGAN Take 1 tablet (12.5 mg total) by mouth every  6 (six) hours as needed for nausea or vomiting. What changed:  how much to take   sildenafil 20 MG tablet Commonly known as:  REVATIO Take 1 tablet (20 mg total) by mouth 3 (three) times daily.   warfarin  5 MG tablet Commonly known as:  COUMADIN Take as directed. If you are unsure how to take this medication, talk to your nurse or doctor. Original instructions:  Take 5 mg (1 tablet) daily except 7.5 mg (1 and 1/2 tablets) on Tuesday       Disposition   The patient will be discharged in stable condition to home. Discharge Instructions    Diet - low sodium heart healthy   Complete by:  As directed    Heart Failure patients record your daily weight using the same scale at the same time of day   Complete by:  As directed    INR  Goal: 2 - 3   Complete by:  As directed    Goal:  2 - 3   Increase activity slowly   Complete by:  As directed    Speed Settings:   Complete by:  As directed    Fixed 5800 RPM Low 5500 RPM     Follow-up Information    Larey Dresser, MD Follow up on 12/27/2017.   Specialty:  Cardiology Why:  INR Check  at 1000 Contact information: [address] [place] [address] [phone]             Duration of Discharge Encounter: Greater than 35 minutes   Signed, Amy Clegg NP-C  12/22/2017, 10:06 AM

## 2017-12-22 NOTE — Progress Notes (Signed)
Dialysis treatment completed.  1500 mL ultrafiltrated.  1000 mL net fluid removal.  Patient status unchanged. Lung sounds clear to ausculation in all fields. No edema. Cardiac: NSR.  Cleansed RIJ catheter with chlorhexidine.  Disconnected lines and flushed ports with saline per protocol.  Ports locked with heparin and capped per protocol.    Report given to bedside, RN Whitney.

## 2017-12-22 NOTE — Progress Notes (Signed)
Patient ID: [name], male   DOB: 1965-11-20, 52 y.o.   MRN: [id number]   Advanced Heart Failure VAD Team Note  PCP-Cardiologist: Aundra Dubin  Subjective:    No further diarrhea, C diff never sent.  WBCs normal.  Afebrile.  Nausea/vomiting has resolved and he has started to eat.  Had HD yesterday and due for HD again today (had missed a day).    LVAD INTERROGATION:  HeartMate 3 LVAD:   Flow 4.6 liters/min, speed 5500, power 4.1, PI 2.8, 15-20 PI events/24 hrs.    Objective:    Vital Signs:   Temp:  [98 F (36.7 C)-98.7 F (37.1 C)] 98 F (36.7 C) (04/19 0700) Pulse Rate:  [69-100] 84 (04/19 0700) Resp:  [15-25] 18 (04/19 0700) BP: (82-129)/(62-90) 105/86 (04/19 0700) SpO2:  [76 %-100 %] 76 % (04/19 0700) Weight:  [166 lb 7.2 oz (75.5 kg)-172 lb 2.9 oz (78.1 kg)] 166 lb 7.2 oz (75.5 kg) (04/19 0530) Last BM Date: 12/20/17 Mean arterial Pressure 80s-low 90s  Intake/Output:   Intake/Output Summary (Last 24 hours) at 12/22/2017 0854 Last data filed at 12/21/2017 1900 Gross per 24 hour  Intake 120 ml  Output 1000 ml  Net -880 ml     Physical Exam    GENERAL: Well appearing this am. NAD.  HEENT: Normal. NECK: Supple, JVP 7-8 cm. Carotids OK.  CARDIAC:  Mechanical heart sounds with LVAD hum present.  LUNGS:  CTAB, normal effort.  ABDOMEN:  NT, ND, no HSM. No bruits or masses. +BS  LVAD exit site: Well-healed and incorporated. Dressing dry and intact. No erythema or drainage. Stabilization device present and accurately applied. Driveline dressing changed daily per sterile technique. EXTREMITIES:  Warm and dry. No cyanosis, clubbing, rash, or edema.  NEUROLOGIC:  Alert & oriented x 3. Cranial nerves grossly intact. Moves all 4 extremities w/o difficulty. Affect pleasant    Telemetry   NSR 80s (personally reviewed)  Labs   Basic Metabolic Panel: Recent Labs  Lab 12/18/17 0500 12/18/17 1141 12/20/17 1550 12/20/17 2120 12/21/17 0237 12/22/17 0319  NA 136  --   138 138 137 137  K 4.1  --  5.1 5.8* 5.1 3.7  CL 101  --  103 102 99* 100*  CO2 23  --  19* 19* 19* 24  GLUCOSE 145*  --  123* 164* 211* 104*  BUN 69*  --  60* 66* 75* 22*  CREATININE 7.95*  --  8.06* 8.36* 8.69* 4.61*  CALCIUM 8.6*  --  9.0 9.0 8.9 8.4*  PHOS  --  4.4  --   --   --   --     Liver Function Tests: Recent Labs  Lab 12/20/17 1550  AST 24  ALT 17  ALKPHOS 104  BILITOT 0.5  PROT 7.6  ALBUMIN 3.4*   No results for input(s): LIPASE, AMYLASE in the last 168 hours. No results for input(s): AMMONIA in the last 168 hours.  CBC: Recent Labs  Lab 12/16/17 0548 12/17/17 0459 12/18/17 0500 12/20/17 1550 12/20/17 2119 12/21/17 0237 12/22/17 0319  WBC 9.9 11.0* 10.5 18.9* 15.9* 13.7* 8.4  NEUTROABS 6.7 7.9* 7.4  --   --   --   --   HGB 9.1* 8.9* 8.7* 9.9* 9.4* 9.2* 10.6*  HCT 28.7* 28.0* 26.7* 31.1* 28.9* 29.4* 34.2*  MCV 88.9 88.6 88.7 88.9 88.1 88.6 90.5  PLT 185 200 225 302 304 343 323    INR: Recent Labs  Lab 12/17/17 0459 12/18/17 0500  12/20/17 2119 12/21/17 0237 12/22/17 0319  INR 1.50 1.89 2.37 2.69 3.34    Other results:  EKG:    Imaging   No results found.   Medications:     Scheduled Medications: . aspirin EC  81 mg Oral Daily  . atorvastatin  40 mg Oral q1800  . calcitRIOL  0.5 mcg Oral Q M,W,F-HD  . darbepoetin (ARANESP) injection - DIALYSIS  60 mcg Intravenous Q Fri-HD  . dronabinol  2.5 mg Oral BID AC  . feeding supplement (NEPRO CARB STEADY)  237 mL Oral BID BM  . hydrALAZINE  37.5 mg Oral TID  . insulin aspart  0-5 Units Subcutaneous QHS  . insulin aspart  0-9 Units Subcutaneous TID WC  . insulin aspart  3 Units Subcutaneous TID WC  . insulin glargine  5 Units Subcutaneous Daily  . metoCLOPramide  10 mg Oral TID  . pantoprazole  40 mg Oral Daily  . sildenafil  20 mg Oral TID  . Warfarin - Pharmacist Dosing Inpatient   Does not apply q1800    Infusions: . sodium chloride    . 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  Admitted for intractable nausea and vomiting.   Assessment/Plan:    1. Intractable nausea/vomiting: Suspect due to diabetic gastroparesis (severe).  He is being evaluated for this at [facility]. In November 2018, GI consulted and he was placed on erythromycin for 7 days + reglan + protonix + phenergan as needed. He had episodes of diarrhea on the day of admission as well.  Mildly elevated WBCs, no fever => WBCs down to normal today.  Nausea/vomiting has resolved, he is eating. He has had no BM since the day of admission.  - He can go home today on his previous gastroparesis regimen.  He needs to followup with the gastroparesis specialist at [facility].  2. ESRD: Needs HD today prior to discharge.  3. Chronic Systolic Heart Failure/HMIII LVAD: VAD parameters stable.  MAP 80s-low 90s.  - Continue 81 mg asa and coumadin, goal INR 2-2.5.  - Volume managed per HD - Continue hydralazine 37.5 mg tid on non hd days.  - Amlodipine was stopped last admission to try to promote increased flow through his fistula.  4. DM: Continue home regimen.  5. HTN: BP ok today.   6. PAF: Maintaining NSR. On coumadin 7. CAD: No chest pain.  LHC with 99% ulcerated lesion proximal RCA with left to right collaterals, 95% mid LAD stenosis after mid LAD stent. s/p PCI to RCA and LAD on 11/25/16.  - Continue statin and ASA. 8. Disposition: Home today after HD.  He will need followup in LVAD clinic.   He will need to followup with the gastroparesis specialist at [facility].  No changes to his admission home medication regimen.   I reviewed the LVAD parameters from today, and compared the results to the patient's prior recorded data.  No programming changes were made.  The LVAD is functioning within specified parameters.  The patient performs LVAD self-test daily.  LVAD interrogation was negative for any significant power changes,  alarms or PI events/speed drops.  LVAD equipment check completed and is in good working order.  Back-up equipment present.   LVAD education done on emergency procedures and precautions and reviewed exit site care.  Length of Stay: 2  Loralie Champagne, MD 12/22/2017, 8:54 AM  VAD Team --- VAD ISSUES ONLY--- Pager [phone] (7am - 7am)  Advanced Heart Failure Team  Pager [phone] (M-F; 7a - 4p)  Please contact [facility] for night-coverage after hours (4p -7a ) and weekends on [email or web address]

## 2017-12-22 NOTE — Progress Notes (Signed)
Patient discharged home with daughter. Personal belongings and paperwork sent home, IV removed.

## 2017-12-22 NOTE — Progress Notes (Signed)
Arrived to patient room 2C-04 at 1020.  Reviewed treatment plan and this RN agrees with plan.  Report received from bedside RN, Whitney.  Consent verified.  Patient A & O X 4.   Lung sounds diminished to ausculation in all fields. No edema. Cardiac:  NSR, LVAD.  Removed caps and cleansed RIJ catheter with chlorhedxidine.  Aspirated ports of heparin and flushed them with saline per protocol.  Connected and secured lines, initiated treatment at 1030.  UF Goal of 1500 mL and net fluid removal 1 L.  Will continue to monitor.

## 2017-12-25 NOTE — Progress Notes (Deleted)
    Postoperative Access Visit   History of Present Illness   [name] is a 52 y.o. year old male w/ LVAD who presents for postoperative follow-up for: right UA AVG thrombectomy (Date: 77/93/96) complicated by R axillary hematoma due to anticoagulation for LVAD.    The patient's wounds are *** healed.  The patient notes *** steal symptoms.  The patient is *** able to complete their activities of daily living.  The patient's current symptoms are: ***.   Physical Examination  ***There were no vitals filed for this visit. ***There is no height or weight on file to calculate BMI.  right arm Incision is *** healed, skin feels ***, hand grip is ***/5, sensation in digits is *** intact, ***palpable thrill, bruit can *** be auscultated     Medical Decision Making   [name] is a 52 y.o. year old male who presents s/p left upper arm arteriovenous graft thrombectomy   Intraoperative findings demonstrate NO venous intimal hyperplasia.  Thrombosis was within the graft, suggestive of inadequate arterial flow.  I doubt placement of a PTFE graft would improve his chances as the Artegraft has better patencies than PTFE.  ***  Thank you for allowing [place] to participate in this patient's care.   Adele Barthel, MD, FACS [facility] Office: [phone] Pager: [phone]

## 2017-12-26 ENCOUNTER — Encounter (HOSPITAL_COMMUNITY): Payer: Self-pay

## 2017-12-27 ENCOUNTER — Ambulatory Visit (INDEPENDENT_AMBULATORY_CARE_PROVIDER_SITE_OTHER): Payer: BLUE CROSS/BLUE SHIELD | Admitting: Vascular Surgery

## 2017-12-27 ENCOUNTER — Other Ambulatory Visit: Payer: Self-pay

## 2017-12-27 ENCOUNTER — Encounter: Payer: Self-pay | Admitting: Vascular Surgery

## 2017-12-27 ENCOUNTER — Other Ambulatory Visit (HOSPITAL_COMMUNITY): Payer: Self-pay

## 2017-12-27 ENCOUNTER — Telehealth (HOSPITAL_COMMUNITY): Payer: Self-pay | Admitting: Pharmacist

## 2017-12-27 ENCOUNTER — Encounter: Payer: BLUE CROSS/BLUE SHIELD | Admitting: Vascular Surgery

## 2017-12-27 VITALS — BP 100/54 | HR 80 | Temp 98.4°F | Resp 16 | Ht 67.0 in | Wt 176.4 lb

## 2017-12-27 DIAGNOSIS — N186 End stage renal disease: Secondary | ICD-10-CM

## 2017-12-27 NOTE — Progress Notes (Signed)
    Postoperative Access Visit   History of Present Illness   [name] is a 52 y.o. year old male who presents for postoperative follow-up for:  Simple thrombectomy RUA AVG (Date: 12/12/17).  The patient's wounds are healed with no drainage from R axillary hematoma.  The patient notes no steal symptoms.  The patient is able to complete their activities of daily living.  The patient's current symptoms are: non.   Physical Examination   Vitals:   12/27/17 0846  BP: (!) 100/54  Pulse: 80  Resp: 16  Temp: 98.4 F (36.9 C)  TempSrc: Oral  SpO2: 100%  Weight: 176 lb 5.9 oz (80 kg)  Height: 5' 7"  (1.702 m)   Body mass index is 27.62 kg/m.  right arm Incision is healed, palpable nodule overlying AVG in axilla, skin feels warm, hand grip is 5/5, sensation in digits is intact, palpable ?thrill, bruit can be auscultated, no thrombus on Sonosite evaluation of the RUA AVG    Medical Decision Making   [name] is a 52 y.o. year old male who presents s/p RUA AVG with Artegraft   Intraoperative findings were concerning with slow healing in this patient, as the venous end of the graft was not fully scarred into the subcutaneous tissue.  There appears to be more of a seroma rather than hematoma in the axilla at this time.  Regardless would not drain given need for full anticoagulation for this LVAD.  This patient's exam is always going to be challenge as no one has much experience with a LVAD and AVG.  The physically exam is different as the patient only has limited pulsatile flow, rather he has continuous cardiac output via the LVAD with superimpose weak pulsatility.  Will see him back in 2 weeks prior to giving the ok to proceed with cannulation.  Thank you for allowing [place] to participate in this patient's care.   Adele Barthel, MD, FACS [facility] Office: [phone] Pager: [phone]

## 2017-12-27 NOTE — Telephone Encounter (Signed)
Called and left VM to remind [name] to check his INR with his home monitor.   Ruta Hinds. Velva Harman, PharmD, BCPS, CPP Clinical Pharmacist Phone: [phone] 12/27/2017 2:18 PM

## 2017-12-28 ENCOUNTER — Ambulatory Visit (HOSPITAL_COMMUNITY): Payer: Self-pay | Admitting: Pharmacist

## 2017-12-28 DIAGNOSIS — I824Y9 Acute embolism and thrombosis of unspecified deep veins of unspecified proximal lower extremity: Secondary | ICD-10-CM

## 2017-12-28 DIAGNOSIS — Z95811 Presence of heart assist device: Secondary | ICD-10-CM

## 2017-12-28 DIAGNOSIS — Z5181 Encounter for therapeutic drug level monitoring: Secondary | ICD-10-CM

## 2017-12-28 LAB — POCT INR: INR: 2.6

## 2018-01-01 ENCOUNTER — Ambulatory Visit (INDEPENDENT_AMBULATORY_CARE_PROVIDER_SITE_OTHER): Payer: BLUE CROSS/BLUE SHIELD | Admitting: *Deleted

## 2018-01-01 DIAGNOSIS — I255 Ischemic cardiomyopathy: Secondary | ICD-10-CM | POA: Diagnosis not present

## 2018-01-01 NOTE — Progress Notes (Signed)
Remote ICD transmission.   

## 2018-01-02 ENCOUNTER — Encounter (HOSPITAL_COMMUNITY): Payer: Self-pay

## 2018-01-02 ENCOUNTER — Encounter: Payer: Self-pay | Admitting: Cardiology

## 2018-01-02 ENCOUNTER — Telehealth (HOSPITAL_COMMUNITY): Payer: Self-pay | Admitting: *Deleted

## 2018-01-02 ENCOUNTER — Other Ambulatory Visit (HOSPITAL_COMMUNITY): Payer: Self-pay | Admitting: *Deleted

## 2018-01-02 ENCOUNTER — Ambulatory Visit (HOSPITAL_COMMUNITY)
Admit: 2018-01-02 | Discharge: 2018-01-02 | Disposition: A | Discharge status: Home / Self Care | Payer: BLUE CROSS/BLUE SHIELD | Attending: Cardiology | Admitting: Cardiology

## 2018-01-02 ENCOUNTER — Ambulatory Visit (HOSPITAL_COMMUNITY): Payer: Self-pay | Admitting: Pharmacist

## 2018-01-02 VITALS — BP 117/65 | HR 80 | Ht 68.0 in | Wt 180.0 lb

## 2018-01-02 DIAGNOSIS — I252 Old myocardial infarction: Secondary | ICD-10-CM | POA: Insufficient documentation

## 2018-01-02 DIAGNOSIS — N186 End stage renal disease: Secondary | ICD-10-CM | POA: Diagnosis not present

## 2018-01-02 DIAGNOSIS — I4892 Unspecified atrial flutter: Secondary | ICD-10-CM | POA: Diagnosis not present

## 2018-01-02 DIAGNOSIS — I5022 Chronic systolic (congestive) heart failure: Secondary | ICD-10-CM | POA: Diagnosis not present

## 2018-01-02 DIAGNOSIS — R918 Other nonspecific abnormal finding of lung field: Secondary | ICD-10-CM | POA: Insufficient documentation

## 2018-01-02 DIAGNOSIS — Z992 Dependence on renal dialysis: Secondary | ICD-10-CM | POA: Insufficient documentation

## 2018-01-02 DIAGNOSIS — Z7982 Long term (current) use of aspirin: Secondary | ICD-10-CM | POA: Insufficient documentation

## 2018-01-02 DIAGNOSIS — I132 Hypertensive heart and chronic kidney disease with heart failure and with stage 5 chronic kidney disease, or end stage renal disease: Secondary | ICD-10-CM | POA: Insufficient documentation

## 2018-01-02 DIAGNOSIS — Z9049 Acquired absence of other specified parts of digestive tract: Secondary | ICD-10-CM | POA: Diagnosis not present

## 2018-01-02 DIAGNOSIS — E1122 Type 2 diabetes mellitus with diabetic chronic kidney disease: Secondary | ICD-10-CM | POA: Insufficient documentation

## 2018-01-02 DIAGNOSIS — Z8546 Personal history of malignant neoplasm of prostate: Secondary | ICD-10-CM | POA: Diagnosis not present

## 2018-01-02 DIAGNOSIS — Z95811 Presence of heart assist device: Secondary | ICD-10-CM | POA: Diagnosis present

## 2018-01-02 DIAGNOSIS — K219 Gastro-esophageal reflux disease without esophagitis: Secondary | ICD-10-CM | POA: Diagnosis not present

## 2018-01-02 DIAGNOSIS — E785 Hyperlipidemia, unspecified: Secondary | ICD-10-CM | POA: Insufficient documentation

## 2018-01-02 DIAGNOSIS — Z7901 Long term (current) use of anticoagulants: Secondary | ICD-10-CM | POA: Diagnosis not present

## 2018-01-02 DIAGNOSIS — Z48812 Encounter for surgical aftercare following surgery on the circulatory system: Secondary | ICD-10-CM | POA: Insufficient documentation

## 2018-01-02 DIAGNOSIS — Z86718 Personal history of other venous thrombosis and embolism: Secondary | ICD-10-CM | POA: Diagnosis not present

## 2018-01-02 DIAGNOSIS — E1121 Type 2 diabetes mellitus with diabetic nephropathy: Secondary | ICD-10-CM | POA: Insufficient documentation

## 2018-01-02 DIAGNOSIS — I2510 Atherosclerotic heart disease of native coronary artery without angina pectoris: Secondary | ICD-10-CM | POA: Diagnosis not present

## 2018-01-02 DIAGNOSIS — I824Y9 Acute embolism and thrombosis of unspecified deep veins of unspecified proximal lower extremity: Secondary | ICD-10-CM

## 2018-01-02 DIAGNOSIS — Z79899 Other long term (current) drug therapy: Secondary | ICD-10-CM | POA: Diagnosis not present

## 2018-01-02 DIAGNOSIS — Z5181 Encounter for therapeutic drug level monitoring: Secondary | ICD-10-CM

## 2018-01-02 DIAGNOSIS — I255 Ischemic cardiomyopathy: Secondary | ICD-10-CM

## 2018-01-02 DIAGNOSIS — I10 Essential (primary) hypertension: Secondary | ICD-10-CM

## 2018-01-02 DIAGNOSIS — K3184 Gastroparesis: Secondary | ICD-10-CM | POA: Insufficient documentation

## 2018-01-02 DIAGNOSIS — Z794 Long term (current) use of insulin: Secondary | ICD-10-CM | POA: Insufficient documentation

## 2018-01-02 DIAGNOSIS — D6851 Activated protein C resistance: Secondary | ICD-10-CM | POA: Insufficient documentation

## 2018-01-02 DIAGNOSIS — N179 Acute kidney failure, unspecified: Secondary | ICD-10-CM | POA: Diagnosis not present

## 2018-01-02 DIAGNOSIS — K5090 Crohn's disease, unspecified, without complications: Secondary | ICD-10-CM | POA: Diagnosis not present

## 2018-01-02 LAB — POCT INR: INR: 2.5

## 2018-01-02 MED ORDER — AMLODIPINE BESYLATE 2.5 MG PO TABS: 2.5000 mg | ORAL_TABLET | Freq: Every day | ORAL | 3 refills | Status: DC

## 2018-01-02 NOTE — Telephone Encounter (Signed)
Clinic follow up:  Called [name] office ([place]) re: pt's f/u appt. Receptionist indicated [name] has been out of office, is back now. She will contact his nurse for f/u instructions for [name] and contact him directly.  I called pt and informed him of above - asked him to continue calling [name] office until he has f/u appt.  I contacted [facility] office re: heart/kidney transplant referral/packet faxed 10/24/17 and emailed 11/20/17. Pt has not had response from them. Emailed [name], Heart Program Specialist, to contact us if [facility] needs any further information.   Orders sent to [facility] requesting LDH and HgbA1c to be drawn Monday, Jan 08, 2018 and results faxed to our office.

## 2018-01-02 NOTE — Progress Notes (Addendum)
Patient presents for hospital  follow up in [facility] today. Reports no problems with VAD equipment or concerns with drive line.  Vital Signs:  Doppler Pressure 102  Automatc BP: 134/69 (96) HR:89  SPO2:99  %  Weight: 176.6 lb w/o eqt Last weight: 178 lb Home weights: 170-175 lbs   VAD Indication: Destination therapy- will fax information to [facility] for transplant eval process to begin    VAD interrogation & Equipment Management: Speed:5500 Flow: 3.6 Power:4.1 w    PI: 6.2  Alarms: none Events: rare exceot 26 PI events on 4/26 - pt report it was day after dialysis and he felt "wiped out", stayed in bed all day..   Fixed speed 5500 Low speed limit: 5200  Primary Controller:  Replace back up battery in 23 months. Back up controller:   Replace back up battery in 23  months.  Annual Equipment Maintenance on UBC/PM was performed on 06/2017.   I reviewed the LVAD parameters from today and compared the results to the patient's prior recorded data. LVAD interrogation was NEGATIVE for significant power changes, NEGATIVE for clinical alarms and STABLE for PI events/speed drops. No programming changes were made and pump is functioning within specified parameters. Pt is performing daily controller and system monitor self tests along with completing weekly and monthly maintenance for LVAD equipment.  LVAD equipment check completed and is in good working order. Back-up equipment present.   Exit Site Care: Drive line is being maintained weekly  by his daughter. Drive line exit site well healed and incorporated. The velour is fully implanted at exit site. Dressing dry and intact. No erythema or drainage. Stabilization device present and accurately applied. Pt denies fever or chills. Pt provided with 8 weekly dressing kits.     Significant Events on VAD Support: -10/2016>> poss drive line infection, CT ABD neg, ID consult-doxy -11/09/16>> admit for poss drive line infection, IV  abx -03/24/17>> doxy for poss drive line infection -05/04/17>> drive line debridement with wound-vac -06/2017>> drive line +proteus, IV abx, Bactrim x14 days  Device:St jude Dual Therapies: on. Monitor on at 150 bpm Therapy on at 200 bpm Last check: 10/02/17  BP & Labs:  Doppler 120 - Doppler is reflecting modified systolic  Hgb not drawn today; No S/S of bleeding. Specifically denies melena/BRBPR or nosebleeds.  LDH not drawn today; established baseline of 245- 350. Denies tea-colored urine. No power elevations noted on interrogation.   Patient Instructions: 1. Re-start Norvasc at reduced dose of 2.5 mg daily. 2. We will assist in re-scheduling f/u appt with Dr. Derrill Kay at [facility]. 3. Will need to get gastroparesis controlled prior to heart/kidney transplant. 4. Will refer to [facility] for consideration heart/kidney transplant. 5. Due to difficult peripheral stick, will ask Dialysis center to draw labs. Will add HgbA1C at next visit along with LDH. 6. No change in coumadin dose, continue weekly home INR checks. 7. Return to [place] clinic in 2 months.    Zada Girt RN VAD Coordinator   Office: [phone] 24/7 Emergency VAD Pager: [phone]    Cardiology: Dr. Aundra Dubin  HPI: 52 y.o. with history of CAD s/p anterior MI in 2010 and NSTEMI in 3/18 with DES to pRCA and mLAD and ischemic cardiomyopathy now s/p Heartmate 3 LVAD and ESRD presents for followup of CHF/LVAD.  Patient developed low output heart failure.  He was not deemed to be a good transplant candidate.  He was admitted in 10/18 and Heartmate 3 LVAD was placed.  He had baseline CKD  stage 3 likely from diabetic nephropathy and CHF.  He developed peri-op AKI and ended up on dialysis.  No renal recovery to date.  St Jude ICD has been turned back on and ramp echo was done prior to discharge in 10/18, speed increased to 5600 rpm.    He was admitted in 11/18 with nausea/vomiting and abdominal pain.  This appears to have been due to  diabetic gastroparesis (extensive GI evaluation). He had atrial flutter this admission that was converted to NSR with amiodarone.  He improved considerably with scheduled Reglan and erythromycin. He was re-admitted later in 11/18 with the same symptoms, suspect diabetic gastroparesis and was again treated with erythromycin.  He was not able to start domperidone due to prolonged QT interval. He had nausea again about a week ago but was able to manage at home with Phenergan suppositories.   Admitted 1/8 -> 09/16/17 for uncontrolled vomiting in setting of diabetic gastroparesis. Improved with phenergan suppositories and given erythromycin TID for 7 days post-discharge.  Referred for follow up with a gastroparesis specialist at [place] (Dr Derrill Kay).   He saw Dr Derrill Kay at [facility] and workup is ongoing.   He was admitted in 2/19 for AV graft placement.  While in the hospital, we turned down his speed.  Since then, he has had considerably fewer PI events on HD days.    He was admitted in 4/19 with nausea/vomiting again, probably gastroparesis flare.    He returns today for followup of LVAD and CHF. He is feeling good currently, eating normally with no abdominal pain or nausea.  He continues to dialyze without major problems.  He can walk on flat ground without dyspnea though he is not very active. We stopped amlodipine to allow increased flow through his AV fistula, now BP is running high.  Reports taking Coumadin as prescribed and adherence to anticoagulation based dietary restrictions. Denies BRBPR or melena. No dark urine or hematuria.   Labs (11/18): hgb 10.4 => 8.7, LDH 213 Labs (08/04/17): hgb 9 Labs (1/19): HgbA1c 6.1 Labs (2/19): hgb 13.2 Labs (3/19): hgb 11.1  PMH: 1. Chronic systolic CHF: Ischemic cardiomyopathy. St Jude ICD.  - Echo (11/17) with EF 25-30%, moderate diastolic dysfunction, mild MR, mildly dilated RV with moderately decreased systolic function, peak RV-RA gradient 48 mmHg.  - Echo (3/18)  with EF 25-30%, wall motion abnormalities, normal RV size and systolic function.  - Admission 3/18 with cardiogenic shock and AKI, required milrinone gtt.  - Echo (8/18) with EF 20-25%, mild LVH, moderate LV dilation, mild MR.  - CPX (8/18) with peak VO2 12.4, VE/VCO2 slope 35, RER 1.05 => severe HR limitation.  - Heartmate 3 LVAD 10/18.  2. CAD: Anterior MI 2005, had bifurcation stenting LAD/diagonal. Repeat cath 2010 with patent stents.  - NSTEMI 3/18: LHC with 99% ulcerated lesion proximal RCA, 95% mid LAD => PCI with DES to proximal RCA and mid LAD.  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  - Lower extremity venous dopplers (8/18): chronic DVT - V/Q scan (8/18): Normal, no PE.  4. Crohns disease: History of colectomy.  5. GERD 6. Diabetic gastroparesis: Admission 11/18 with abdominal pain, nausea/vomiting.  7. HTN 8. Hyperlipidemia 9. Type II diabetes  10. ESRD: Post-op LVAD surgery.  11. Carotid dopplers (8/18): mild bilateral stenosis.  12. PFTs (8/18): Mild restriction (no IDL on CT chest).  13. Lung nodules: CT chest 8/18 with small bilateral nodules.  14. PAD: ABIs with moderate bilateral disease in 8/18.  15. Atrial flutter:  In 11/18 associated with nausea/vomiting from gastroparesis.   Current Outpatient Medications  Medication Sig Dispense Refill  . aspirin EC 81 MG tablet Take 1 tablet (81 mg total) by mouth daily. 90 tablet 3  . atorvastatin (LIPITOR) 40 MG tablet Take 1 tablet (40 mg total) daily at 6 PM by mouth. 30 tablet 6  . docusate sodium (COLACE) 100 MG capsule Take 2 capsules (200 mg total) by mouth daily. 10 capsule 0  . dronabinol (MARINOL) 2.5 MG capsule Take 1 capsule (2.5 mg total) by mouth 2 (two) times daily before lunch and supper. 60 capsule 1  . hydrALAZINE (APRESOLINE) 25 MG tablet Take 1.5 tablets (37.5 mg total) by mouth 3 (three) times daily. Take on non-hd days 135 tablet 3  . insulin aspart (NOVOLOG) 100 UNIT/ML injection Inject 3 Units into the skin 3  (three) times daily with meals. (Patient taking differently: Inject 5 Units into the skin 3 (three) times daily with meals. ) 1 vial 0  . insulin glargine (LANTUS) 100 UNIT/ML injection Inject 0.05 mLs (5 Units total) into the skin daily. (Patient taking differently: Inject 10 Units into the skin daily. ) 10 mL 11  . LORazepam (ATIVAN) 0.5 MG tablet Take 1 tablet (0.5 mg total) by mouth every 12 (twelve) hours as needed for anxiety or sleep. 30 tablet 0  . metoCLOPramide (REGLAN) 5 MG tablet Take 1 tablet (5 mg total) by mouth 4 (four) times daily -  before meals and at bedtime. (Patient taking differently: Take 10 mg by mouth 3 (three) times daily. ) 130 tablet 6  . ondansetron (ZOFRAN ODT) 4 MG disintegrating tablet Take 1 tablet (4 mg total) by mouth every 6 (six) hours as needed for nausea or vomiting. 40 tablet 1  . pantoprazole (PROTONIX) 40 MG tablet Take 40 mg by mouth daily.    . promethazine (PHENERGAN) 12.5 MG tablet Take 1 tablet (12.5 mg total) by mouth every 6 (six) hours as needed for nausea or vomiting. (Patient taking differently: Take 25 mg by mouth every 6 (six) hours as needed for nausea or vomiting. ) 30 tablet 0  . sildenafil (REVATIO) 20 MG tablet Take 1 tablet (20 mg total) by mouth 3 (three) times daily. 90 tablet 6  . warfarin (COUMADIN) 5 MG tablet Take 5 mg (1 tablet) daily except 7.5 mg (1 and 1/2 tablets) on Tuesday and Thursday    . amLODipine (NORVASC) 2.5 MG tablet Take 1 tablet (2.5 mg total) by mouth daily. 180 tablet 3   No current facility-administered medications for this encounter.     Metformin and related   Review of systems complete and found to be negative unless listed in HPI.    BP 117/65 Comment: auto cuff MAP 105  Pulse 80   Ht 5' 8"  (1.727 m)   Wt 180 lb (81.6 kg)   BMI 27.37 kg/m  MAP 105  VAD interrogation & Equipment Management: See nurse's note above.   I reviewed the LVAD parameters from today, and compared the results to the  patient's prior recorded data.  No programming changes were made.  The LVAD is functioning within specified parameters.  The patient performs LVAD self-test daily.  LVAD interrogation was negative for any significant power changes, alarms or PI events/speed drops.  LVAD equipment check completed and is in good working order.  Back-up equipment present.   LVAD education done on emergency procedures and precautions and reviewed exit site care.   Physical Exam: GENERAL: Well appearing  this am. NAD.  HEENT: Normal. NECK: Supple, JVP 7-8 cm. Carotids OK.  CARDIAC:  Mechanical heart sounds with LVAD hum present.  LUNGS:  CTAB, normal effort.  ABDOMEN:  NT, ND, no HSM. No bruits or masses. +BS  LVAD exit site: Well-healed and incorporated. Dressing dry and intact. No erythema or drainage. Stabilization device present and accurately applied. Driveline dressing changed daily per sterile technique. EXTREMITIES:  Warm and dry. No cyanosis, clubbing, rash, or edema.  NEUROLOGIC:  Alert & oriented x 3. Cranial nerves grossly intact. Moves all 4 extremities w/o difficulty. Affect pleasant    ASSESSMENT AND PLAN: 1. Chronic systolic CHF: Ischemic cardiomyopathy.  St Jude ICD.  NSTEMI in March 2018 with DES to LAD and RCA, complicated by cardiogenic shock, low output requiring milrinone.  Echo 8/18 with EF 20-25%, moderate MR.  PX (8/18) with severe functional impairment due to HF.  He is now s/p HM3 LVAD placement. LVAD parameters reviewed today and stable, fewer PI events at HD now with lower speed.  No complaints. MAP 105, now off amlodipine to promote higher flow through his AV fistula.  NYHA II. Volume status stable on exam.  - With elevated MAP, will restart amlodipine at lower dose, 2.5 mg daily, on non-HD days.  - Continue ASA 81.  Continue warfarin INR 2-3 for now with no overt GI bleeding.  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  - Continue hydralazine 37.5 mg tid on non-HD days.  - He has been  referred to [facility] for evaluation for heart/kidney transplant.  We will, however, need to get his gastroparesis under better control prior to considering transplant.  2. ESRD: Developed peri-operatively after HM3 placement.  He is tolerating HD, not sure the AV fistula on his arm is going to work.  3. CAD: NSTEMI in 3/18. LHC with 99% ulcerated lesion proximal RCA with left to right collaterals, 95% mid LAD stenosis after mid LAD stent. s/p PCI to RCA and LAD on 11/25/16.   - Continue statin and ASA. 4. h/o DVTs: Factor V Leiden heterozygote. - On coumadin. 5. Diabetic gastroparesis: Difficult to manage. He has seen gastroparesis specialist at [facility] and workup is continuing. - Continue reglan 5 mg tid/ac - We will make sure that he has followup at [facility].  6. DM: HgbA1c well-controlled recently, will resend today.   7. HTN: MAP high, restart lower dose of amlodipine as above.   8. PAD: Moderate bilateral disease on ABIs in 8/18.  Denies claudication currently, no pedal ulcers.  9. Atrial flutter: Paroxysmal.  He has been in NSR recently.   Followup in 2 months.   Loralie Champagne, MD  01/02/2018

## 2018-01-02 NOTE — Patient Instructions (Signed)
1. Re-start Norvasc at reduced dose of 2.5 mg daily. 2. We will assist in re-scheduling f/u appt with Dr. Derrill Kay at [facility]. 3. Will need to get gastroparesis controlled prior to heart/kidney transplant. 4. Will refer to [facility] for consideration heart/kidney transplant. 5. Due to difficult peripheral stick, will ask Dialysis center to draw labs. Will add HgbA1C at next visit along with LDH. 6. No change in coumadin dose, continue weekly home INR checks. 7. Return to [place] clinic in 2 months.

## 2018-01-10 ENCOUNTER — Encounter: Payer: BLUE CROSS/BLUE SHIELD | Admitting: Vascular Surgery

## 2018-01-10 DIAGNOSIS — Z939 Artificial opening status, unspecified: Secondary | ICD-10-CM | POA: Insufficient documentation

## 2018-01-10 DIAGNOSIS — D6859 Other primary thrombophilia: Secondary | ICD-10-CM | POA: Insufficient documentation

## 2018-01-10 DIAGNOSIS — D6851 Activated protein C resistance: Secondary | ICD-10-CM | POA: Insufficient documentation

## 2018-01-11 ENCOUNTER — Ambulatory Visit (HOSPITAL_COMMUNITY): Payer: Self-pay | Admitting: Pharmacist

## 2018-01-11 ENCOUNTER — Telehealth (HOSPITAL_COMMUNITY): Payer: Self-pay

## 2018-01-11 ENCOUNTER — Telehealth: Payer: Self-pay | Admitting: *Deleted

## 2018-01-11 DIAGNOSIS — Z95811 Presence of heart assist device: Secondary | ICD-10-CM

## 2018-01-11 DIAGNOSIS — I824Y9 Acute embolism and thrombosis of unspecified deep veins of unspecified proximal lower extremity: Secondary | ICD-10-CM

## 2018-01-11 DIAGNOSIS — Z5181 Encounter for therapeutic drug level monitoring: Secondary | ICD-10-CM

## 2018-01-11 LAB — POCT INR: INR: 2.4

## 2018-01-11 NOTE — Telephone Encounter (Signed)
Attempted to call patient in regards to Cardiac Rehab - lm on vm

## 2018-01-11 NOTE — Telephone Encounter (Signed)
Spoke to [employer] at [employer]. Informed that patient has appointment on 5/15/ 19 with Dr. Bridgett Larsson who will then determine when HD access can be cannulated. Gave her contact info for Ronnie Derby for instructions for ARTEGRAFT cannulation. I also sent a DETAILED e-mail to Ronnie Derby to contact the [facility]. Regarding this.

## 2018-01-12 NOTE — Progress Notes (Deleted)
    Postoperative Access Visit   History of Present Illness   Adyen E Stann is a 52 y.o. year old male who presents for postoperative follow-up for: Simple thrombectomy RUA AVG (Date: 12/12/17).  The patient's wounds are *** healed.  The patient notes *** steal symptoms.  The patient is *** able to complete their activities of daily living.  The patient's current symptoms are: ***.   Physical Examination  ***There were no vitals filed for this visit. ***There is no height or weight on file to calculate BMI.  right arm Incision is *** healed, skin feels ***, hand grip is ***/5, sensation in digits is *** intact, ***palpable thrill, bruit can *** be auscultated     Medical Decision Making   Klinton E Molinelli is a 52 y.o. year old male who presents s/p RUA AVG with Artegraft   The patient's access is *** ready for use.  ***The patient's tunneled dialysis catheter can be removed after two successful cannulations and completed dialysis treatments.  Thank you for allowing [place] to participate in this patient's care.   Adele Barthel, MD, FACS [facility] Office: [phone] Pager: [phone]

## 2018-01-16 ENCOUNTER — Encounter (HOSPITAL_COMMUNITY): Payer: Self-pay

## 2018-01-17 ENCOUNTER — Ambulatory Visit: Payer: BLUE CROSS/BLUE SHIELD | Admitting: Vascular Surgery

## 2018-01-17 ENCOUNTER — Ambulatory Visit (HOSPITAL_COMMUNITY): Payer: Self-pay | Admitting: Pharmacist

## 2018-01-17 DIAGNOSIS — Z95811 Presence of heart assist device: Secondary | ICD-10-CM

## 2018-01-17 DIAGNOSIS — Z5181 Encounter for therapeutic drug level monitoring: Secondary | ICD-10-CM

## 2018-01-17 LAB — POCT INR: INR: 3

## 2018-01-18 ENCOUNTER — Telehealth (HOSPITAL_COMMUNITY): Payer: Self-pay | Admitting: *Deleted

## 2018-01-18 ENCOUNTER — Other Ambulatory Visit (HOSPITAL_COMMUNITY): Payer: Self-pay | Admitting: Internal Medicine

## 2018-01-18 NOTE — Telephone Encounter (Signed)
Pt called VAD pager to report his N&V is better today. After cancelling dialysis yesterday due to extreme N&V, pt used Phenergan suppositories as instructed per Dr. Aundra Dubin. Vomiting stopped, but pt remains nauseous. He did tolerate dialysis today and has had no further vomiting. He has not taken any other prn for nausea, but may try zofran or ativan if needed. Pt verbalized understanding of same.  He also has been notified by [facility] to schedule heart/kideny transplant eval. He was informed he needs RHC. Will schedule with Dr. Aundra Dubin here. Pt verbalized understanding of same.

## 2018-01-19 ENCOUNTER — Encounter (HOSPITAL_COMMUNITY): Payer: Self-pay

## 2018-01-19 ENCOUNTER — Inpatient Hospital Stay (HOSPITAL_COMMUNITY): Payer: BLUE CROSS/BLUE SHIELD

## 2018-01-19 ENCOUNTER — Other Ambulatory Visit: Payer: Self-pay

## 2018-01-19 ENCOUNTER — Inpatient Hospital Stay (HOSPITAL_COMMUNITY)
Admission: EM | Admit: 2018-01-19 | Discharge: 2018-01-22 | DRG: 073 | Disposition: A | Discharge status: Home / Self Care | Payer: BLUE CROSS/BLUE SHIELD | Attending: Cardiology | Admitting: Cardiology

## 2018-01-19 DIAGNOSIS — Z992 Dependence on renal dialysis: Secondary | ICD-10-CM | POA: Diagnosis not present

## 2018-01-19 DIAGNOSIS — E1143 Type 2 diabetes mellitus with diabetic autonomic (poly)neuropathy: Secondary | ICD-10-CM | POA: Diagnosis not present

## 2018-01-19 DIAGNOSIS — I5022 Chronic systolic (congestive) heart failure: Secondary | ICD-10-CM | POA: Diagnosis present

## 2018-01-19 DIAGNOSIS — Z79899 Other long term (current) drug therapy: Secondary | ICD-10-CM | POA: Diagnosis not present

## 2018-01-19 DIAGNOSIS — E785 Hyperlipidemia, unspecified: Secondary | ICD-10-CM | POA: Diagnosis present

## 2018-01-19 DIAGNOSIS — Z888 Allergy status to other drugs, medicaments and biological substances status: Secondary | ICD-10-CM | POA: Diagnosis not present

## 2018-01-19 DIAGNOSIS — I2510 Atherosclerotic heart disease of native coronary artery without angina pectoris: Secondary | ICD-10-CM | POA: Diagnosis present

## 2018-01-19 DIAGNOSIS — K5090 Crohn's disease, unspecified, without complications: Secondary | ICD-10-CM | POA: Diagnosis present

## 2018-01-19 DIAGNOSIS — Z955 Presence of coronary angioplasty implant and graft: Secondary | ICD-10-CM

## 2018-01-19 DIAGNOSIS — K3184 Gastroparesis: Secondary | ICD-10-CM | POA: Diagnosis present

## 2018-01-19 DIAGNOSIS — D631 Anemia in chronic kidney disease: Secondary | ICD-10-CM | POA: Diagnosis present

## 2018-01-19 DIAGNOSIS — E1122 Type 2 diabetes mellitus with diabetic chronic kidney disease: Secondary | ICD-10-CM | POA: Diagnosis present

## 2018-01-19 DIAGNOSIS — Z7982 Long term (current) use of aspirin: Secondary | ICD-10-CM | POA: Diagnosis not present

## 2018-01-19 DIAGNOSIS — G47419 Narcolepsy without cataplexy: Secondary | ICD-10-CM | POA: Diagnosis present

## 2018-01-19 DIAGNOSIS — Z95811 Presence of heart assist device: Secondary | ICD-10-CM

## 2018-01-19 DIAGNOSIS — Z9115 Patient's noncompliance with renal dialysis: Secondary | ICD-10-CM

## 2018-01-19 DIAGNOSIS — K219 Gastro-esophageal reflux disease without esophagitis: Secondary | ICD-10-CM | POA: Diagnosis present

## 2018-01-19 DIAGNOSIS — R112 Nausea with vomiting, unspecified: Secondary | ICD-10-CM

## 2018-01-19 DIAGNOSIS — Z794 Long term (current) use of insulin: Secondary | ICD-10-CM | POA: Diagnosis not present

## 2018-01-19 DIAGNOSIS — I255 Ischemic cardiomyopathy: Secondary | ICD-10-CM | POA: Diagnosis present

## 2018-01-19 DIAGNOSIS — E118 Type 2 diabetes mellitus with unspecified complications: Secondary | ICD-10-CM

## 2018-01-19 DIAGNOSIS — N186 End stage renal disease: Secondary | ICD-10-CM | POA: Diagnosis present

## 2018-01-19 DIAGNOSIS — N2581 Secondary hyperparathyroidism of renal origin: Secondary | ICD-10-CM | POA: Diagnosis present

## 2018-01-19 DIAGNOSIS — I132 Hypertensive heart and chronic kidney disease with heart failure and with stage 5 chronic kidney disease, or end stage renal disease: Secondary | ICD-10-CM | POA: Diagnosis present

## 2018-01-19 DIAGNOSIS — Z7901 Long term (current) use of anticoagulants: Secondary | ICD-10-CM | POA: Diagnosis not present

## 2018-01-19 LAB — CBC WITH DIFFERENTIAL/PLATELET
Abs Immature Granulocytes: 0.1 10*3/uL (ref 0.0–0.1)
BASOS PCT: 0 %
Basophils Absolute: 0 10*3/uL (ref 0.0–0.1)
EOS ABS: 0 10*3/uL (ref 0.0–0.7)
EOS PCT: 0 %
HCT: 42.3 % (ref 39.0–52.0)
Hemoglobin: 13.3 g/dL (ref 13.0–17.0)
IMMATURE GRANULOCYTES: 1 %
Lymphocytes Relative: 10 %
Lymphs Abs: 1.1 10*3/uL (ref 0.7–4.0)
MCH: 28.7 pg (ref 26.0–34.0)
MCHC: 31.4 g/dL (ref 30.0–36.0)
MCV: 91.4 fL (ref 78.0–100.0)
MONOS PCT: 4 %
Monocytes Absolute: 0.5 10*3/uL (ref 0.1–1.0)
NEUTROS PCT: 85 %
Neutro Abs: 9.5 10*3/uL — ABNORMAL HIGH (ref 1.7–7.7)
PLATELETS: 282 10*3/uL (ref 150–400)
RBC: 4.63 MIL/uL (ref 4.22–5.81)
RDW: 18.4 % — AB (ref 11.5–15.5)
WBC: 11.1 10*3/uL — ABNORMAL HIGH (ref 4.0–10.5)

## 2018-01-19 LAB — COMPREHENSIVE METABOLIC PANEL
ALBUMIN: 3.7 g/dL (ref 3.5–5.0)
ALK PHOS: 116 U/L (ref 38–126)
ALT: 17 U/L (ref 17–63)
ANION GAP: 16 — AB (ref 5–15)
AST: 19 U/L (ref 15–41)
BILIRUBIN TOTAL: 0.8 mg/dL (ref 0.3–1.2)
BUN: 30 mg/dL — ABNORMAL HIGH (ref 6–20)
CALCIUM: 8.8 mg/dL — AB (ref 8.9–10.3)
CO2: 25 mmol/L (ref 22–32)
CREATININE: 5.29 mg/dL — AB (ref 0.61–1.24)
Chloride: 95 mmol/L — ABNORMAL LOW (ref 101–111)
GFR calc non Af Amer: 11 mL/min — ABNORMAL LOW (ref >60)
GFR, EST AFRICAN AMERICAN: 13 mL/min — AB (ref >60)
GLUCOSE: 119 mg/dL — AB (ref 65–99)
Potassium: 4.2 mmol/L (ref 3.5–5.1)
Sodium: 136 mmol/L (ref 135–145)
TOTAL PROTEIN: 7.8 g/dL (ref 6.5–8.1)

## 2018-01-19 LAB — CUP PACEART REMOTE DEVICE CHECK
Battery Remaining Percentage: 84 %
Battery Voltage: 2.99 V
Brady Statistic AP VP Percent: 1 %
Brady Statistic AS VS Percent: 98 %
Brady Statistic RA Percent Paced: 1 %
Brady Statistic RV Percent Paced: 1 %
Date Time Interrogation Session: 20190429072652
HIGH POWER IMPEDANCE MEASURED VALUE: 38 Ohm
HighPow Impedance: 38 Ohm
Implantable Lead Implant Date: 20100430
Implantable Lead Location: 753859
Implantable Lead Location: 753860
Implantable Lead Model: 7121
Implantable Pulse Generator Implant Date: 20180110
Lead Channel Impedance Value: 340 Ohm
Lead Channel Impedance Value: 360 Ohm
Lead Channel Pacing Threshold Amplitude: 0.5 V
Lead Channel Pacing Threshold Pulse Width: 0.5 ms
Lead Channel Pacing Threshold Pulse Width: 0.5 ms
Lead Channel Setting Pacing Amplitude: 2.5 V
Lead Channel Setting Pacing Amplitude: 2.5 V
Lead Channel Setting Pacing Pulse Width: 0.5 ms
MDC IDC LEAD IMPLANT DT: 20100430
MDC IDC MSMT BATTERY REMAINING LONGEVITY: 76 mo
MDC IDC MSMT LEADCHNL RA PACING THRESHOLD AMPLITUDE: 0.75 V
MDC IDC MSMT LEADCHNL RA SENSING INTR AMPL: 1.9 mV
MDC IDC MSMT LEADCHNL RV SENSING INTR AMPL: 12 mV
MDC IDC PG SERIAL: 7383809
MDC IDC SET LEADCHNL RV SENSING SENSITIVITY: 0.5 mV
MDC IDC STAT BRADY AP VS PERCENT: 1.2 %
MDC IDC STAT BRADY AS VP PERCENT: 1 %

## 2018-01-19 LAB — GLUCOSE, CAPILLARY: Glucose-Capillary: 105 mg/dL — ABNORMAL HIGH (ref 65–99)

## 2018-01-19 LAB — LACTATE DEHYDROGENASE: LDH: 258 U/L — AB (ref 98–192)

## 2018-01-19 LAB — LIPASE, BLOOD: Lipase: 19 U/L (ref 11–51)

## 2018-01-19 LAB — PROTIME-INR
INR: 2.54
Prothrombin Time: 27.1 seconds — ABNORMAL HIGH (ref 11.4–15.2)

## 2018-01-19 MED ORDER — ERYTHROMYCIN BASE 250 MG PO TABS
250.0000 mg | ORAL_TABLET | Freq: Three times a day (TID) | ORAL | Status: DC
  Administered 2018-01-19 – 2018-01-22 (×10): 250 mg via ORAL
  Filled 2018-01-19 (×13): qty 1

## 2018-01-19 MED ORDER — AMLODIPINE BESYLATE 2.5 MG PO TABS
2.5000 mg | ORAL_TABLET | Freq: Every day | ORAL | Status: DC
  Administered 2018-01-20 – 2018-01-22 (×3): 2.5 mg via ORAL
  Filled 2018-01-19 (×3): qty 1

## 2018-01-19 MED ORDER — PROMETHAZINE HCL 25 MG PO TABS: 25.0000 mg | ORAL_TABLET | Freq: Four times a day (QID) | ORAL | Status: DC | PRN

## 2018-01-19 MED ORDER — SILDENAFIL CITRATE 20 MG PO TABS
20.0000 mg | ORAL_TABLET | Freq: Three times a day (TID) | ORAL | Status: DC
  Administered 2018-01-19 – 2018-01-22 (×9): 20 mg via ORAL
  Filled 2018-01-19 (×9): qty 1

## 2018-01-19 MED ORDER — DOCUSATE SODIUM 100 MG PO CAPS
200.0000 mg | ORAL_CAPSULE | Freq: Every day | ORAL | Status: DC
  Administered 2018-01-20: 200 mg via ORAL
  Filled 2018-01-19 (×2): qty 2

## 2018-01-19 MED ORDER — DRONABINOL 2.5 MG PO CAPS
2.5000 mg | ORAL_CAPSULE | Freq: Two times a day (BID) | ORAL | Status: DC
  Administered 2018-01-19 – 2018-01-22 (×7): 2.5 mg via ORAL
  Filled 2018-01-19 (×7): qty 1

## 2018-01-19 MED ORDER — LORAZEPAM 2 MG/ML IJ SOLN: 0.5000 mg | Freq: Four times a day (QID) | INTRAMUSCULAR | Status: DC

## 2018-01-19 MED ORDER — ATORVASTATIN CALCIUM 40 MG PO TABS
40.0000 mg | ORAL_TABLET | Freq: Every day | ORAL | Status: DC
  Administered 2018-01-19 – 2018-01-22 (×4): 40 mg via ORAL
  Filled 2018-01-19 (×4): qty 1

## 2018-01-19 MED ORDER — LORAZEPAM 2 MG/ML IJ SOLN
0.5000 mg | Freq: Four times a day (QID) | INTRAMUSCULAR | Status: DC | PRN
  Administered 2018-01-19: 0.5 mg via INTRAVENOUS
  Filled 2018-01-19: qty 1

## 2018-01-19 MED ORDER — HYDRALAZINE HCL 20 MG/ML IJ SOLN
10.0000 mg | Freq: Four times a day (QID) | INTRAMUSCULAR | Status: DC | PRN
  Administered 2018-01-19: 10 mg via INTRAVENOUS
  Filled 2018-01-19 (×2): qty 1

## 2018-01-19 MED ORDER — INSULIN ASPART 100 UNIT/ML ~~LOC~~ SOLN
3.0000 [IU] | Freq: Three times a day (TID) | SUBCUTANEOUS | Status: DC
  Administered 2018-01-20 – 2018-01-22 (×7): 3 [IU] via SUBCUTANEOUS

## 2018-01-19 MED ORDER — WARFARIN SODIUM 5 MG PO TABS
5.0000 mg | ORAL_TABLET | ORAL | Status: AC
  Administered 2018-01-19: 5 mg via ORAL
  Filled 2018-01-19: qty 1

## 2018-01-19 MED ORDER — SODIUM CHLORIDE 0.9% FLUSH
10.0000 mL | INTRAVENOUS | Status: DC | PRN
  Administered 2018-01-20: 10 mL
  Filled 2018-01-19: qty 40

## 2018-01-19 MED ORDER — ASPIRIN EC 81 MG PO TBEC
81.0000 mg | DELAYED_RELEASE_TABLET | Freq: Every day | ORAL | Status: DC
  Administered 2018-01-20 – 2018-01-22 (×3): 81 mg via ORAL
  Filled 2018-01-19 (×3): qty 1

## 2018-01-19 MED ORDER — PROMETHAZINE HCL 25 MG/ML IJ SOLN
12.5000 mg | Freq: Four times a day (QID) | INTRAMUSCULAR | Status: DC | PRN
  Administered 2018-01-19: 12.5 mg via INTRAVENOUS
  Filled 2018-01-19: qty 1

## 2018-01-19 MED ORDER — METOCLOPRAMIDE HCL 10 MG PO TABS
10.0000 mg | ORAL_TABLET | Freq: Three times a day (TID) | ORAL | Status: DC
  Administered 2018-01-19 – 2018-01-22 (×10): 10 mg via ORAL
  Filled 2018-01-19 (×10): qty 1

## 2018-01-19 MED ORDER — HALOPERIDOL LACTATE 5 MG/ML IJ SOLN: 2.5000 mg | Freq: Once | INTRAMUSCULAR | Status: DC

## 2018-01-19 MED ORDER — SODIUM CHLORIDE 0.9% FLUSH
10.0000 mL | Freq: Two times a day (BID) | INTRAVENOUS | Status: DC
  Administered 2018-01-19 – 2018-01-21 (×5): 10 mL

## 2018-01-19 MED ORDER — ONDANSETRON HCL 4 MG/2ML IJ SOLN
4.0000 mg | Freq: Four times a day (QID) | INTRAMUSCULAR | Status: DC | PRN
  Administered 2018-01-19: 4 mg via INTRAVENOUS
  Filled 2018-01-19: qty 2

## 2018-01-19 MED ORDER — WARFARIN - PHARMACIST DOSING INPATIENT
Freq: Every day | Status: DC
  Administered 2018-01-21 – 2018-01-22 (×2)

## 2018-01-19 MED ORDER — INSULIN GLARGINE 100 UNIT/ML ~~LOC~~ SOLN
5.0000 [IU] | Freq: Every day | SUBCUTANEOUS | Status: DC
  Administered 2018-01-20 – 2018-01-22 (×3): 5 [IU] via SUBCUTANEOUS
  Filled 2018-01-19 (×4): qty 0.05

## 2018-01-19 MED ORDER — PANTOPRAZOLE SODIUM 40 MG PO TBEC
40.0000 mg | DELAYED_RELEASE_TABLET | Freq: Every day | ORAL | Status: DC
  Administered 2018-01-20 – 2018-01-22 (×3): 40 mg via ORAL
  Filled 2018-01-19: qty 2
  Filled 2018-01-19 (×2): qty 1

## 2018-01-19 MED ORDER — LORAZEPAM 0.5 MG PO TABS: 0.5000 mg | ORAL_TABLET | Freq: Two times a day (BID) | ORAL | Status: DC | PRN

## 2018-01-19 MED ORDER — ACETAMINOPHEN 325 MG PO TABS: 650.0000 mg | ORAL_TABLET | ORAL | Status: DC | PRN

## 2018-01-19 NOTE — ED Notes (Signed)
Pt states he did not go to dialysis on Wednesday because he did not feel good.

## 2018-01-19 NOTE — Progress Notes (Addendum)
ANTICOAGULATION CONSULT NOTE - Initial Consult  Pharmacy Consult for warfarin Indication: history of DVT, LVAD  Allergies  Allergen Reactions  . Metformin And Related Diarrhea    Patient Measurements: Height: 5' 8"  (172.7 cm) Weight: 180 lb (81.6 kg) IBW/kg (Calculated) : 68.4   Vital Signs: BP: 155/101 (05/17 1500) Pulse Rate: 89 (05/17 1500)  Labs: Recent Labs    01/17/18  INR 3.0    CrCl cannot be calculated (Patient's most recent lab result is older than the maximum 21 days allowed.).   Medical History: Past Medical History:  Diagnosis Date  . Angina   . ASCVD (arteriosclerotic cardiovascular disease)    , Anterior infarction 2005, LAD diagonal bifurcation intervention 03/2004  . Automatic implantable cardiac defibrillator -St. Jude's       . Benign neoplasm of colon   . CHF (congestive heart failure) ([place])   . Chronic systolic heart failure ([place])   . Coronary artery disease     Widely patent previously placed stents in the left anterior   . Crohn's disease ([place])   . 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 Dialysis patient ([place])   . Dyspnea   . Gastroparesis   . GERD (gastroesophageal reflux disease)   . High cholesterol   . Hyperlipidemia   . Hypersomnolent    Previous diagnosis of narcolepsy  . Hypertension, essential   . Ischemic cardiomyopathy    Ejection fraction 15-20% catheterization 2010  . Type II or unspecified type diabetes mellitus without mention of complication, not stated as uncontrolled   . Unspecified gastritis and gastroduodenitis without mention of hemorrhage    Assessment: 52 year old male with LVAD admitted for intractable nausea and vomiting.   INR at goal on 5/15 at 3.0, INR today pending. Pta warfarin dose 5m daily except 7.581mTuesday and Thursday.   Redose warfarin based on results this afternoon.  Goal of Therapy:  INR 2-3 Monitor platelets by anticoagulation protocol: Yes   Plan:  INR  pending  FrErin HearingharmD., BCPS Clinical Pharmacist 01/19/2018 3:33 PM    18:30 PM Admission INR = 2.54 Warfarin 5 mg po x 1  Thank you LiAnette GuarneriPharmD [phone]

## 2018-01-19 NOTE — ED Provider Notes (Signed)
[facility] Provider Note   CSN: [id number] Arrival date & time: 01/19/18  1349     History   Chief Complaint Chief Complaint  Patient presents with  . LVAD problem  . Emesis    HPI [name] is a 52 y.o. male with a past medical history of CHF and end-stage renal disease with LVAD presents the emergency department with nausea vomiting.  He has a history of recurrent gastroparesis with intractable vomiting.  He has had 2 days of vomiting.  He missed dialysis Wednesday but went yesterday.  He went today to get back on schedule but was too weak and could not stop vomiting and was therefore sent to the emergency department.  He has been taking Reglan and Ativan at home without successful treatment of his vomiting.  He denies abdominal pain, he has persistent nausea.  HPI  Past Medical History:  Diagnosis Date  . Angina   . ASCVD (arteriosclerotic cardiovascular disease)    , Anterior infarction 2005, LAD diagonal bifurcation intervention 03/2004  . Automatic implantable cardiac defibrillator -[employer]       . Benign neoplasm of colon   . CHF (congestive heart failure) ([place])   . Chronic systolic heart failure ([place])   . Coronary artery disease     Widely patent previously placed stents in the left anterior   . Crohn's disease ([place])   . 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 Dialysis patient ([place])   . Dyspnea   . Gastroparesis   . GERD (gastroesophageal reflux disease)   . High cholesterol   . Hyperlipidemia   . Hypersomnolent    Previous diagnosis of narcolepsy  . Hypertension, essential   . Ischemic cardiomyopathy    Ejection fraction 15-20% catheterization 2010  . Type II or unspecified type diabetes mellitus without mention of complication, not stated as uncontrolled   . Unspecified gastritis and gastroduodenitis without mention of hemorrhage     Patient Active Problem List   Diagnosis Date Noted    . ESRD (end stage renal disease) ([place]) 12/11/2017  . Left ventricular assist device present ([place]) 12/05/2017  . Chronic kidney disease with end stage renal failure on dialysis ([place]) 10/29/2017  . Nausea with vomiting 09/12/2017  . Intractable nausea and vomiting 07/31/2017  . 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) ([place]) 07/10/2017  . LVAD (left ventricular assist device) present ([place])   . Palliative care by specialist   . Chronic systolic heart failure ([place]) 06/07/2017  . ACP (advance care planning)   . Goals of care, counseling/discussion   . Diarrhea   . Palliative care encounter   . Nausea   . CHF exacerbation ([place]) 04/05/2017  . Diabetic keto-acidosis ([place]) 04/05/2017  . Type 2 diabetes mellitus with complication, with long-term current use of insulin ([place]) 03/08/2017  . Acute on chronic combined systolic and diastolic CHF (congestive heart failure) ([place]) 02/08/2017  . ESRD on dialysis ([place]) 01/17/2017  . AKI (acute kidney injury) ([place])   . Non-ST elevation (NSTEMI) myocardial infarction ([place])   . CHF (congestive heart failure) ([name]) 09/02/2016  . Elevated serum creatinine   . Encounter for therapeutic drug monitoring 08/03/2016  . Chest pain 04/26/2012  . SVT (supraventricular tachycardia) ([place]) 04/26/2012  . Diabetic gastroparesis ([place]) 11/18/2011  . Nausea and vomiting in adult 11/17/2011  . Coronary artery disease   . Ischemic cardiomyopathy   . Essential hypertension   . Automatic implantable cardioverter-defibrillator in  situ   . Deep venous thrombosis ([place])   . Hypersomnolent   . POLYP, COLON 09/25/2008  . Type 2 diabetes mellitus ([place]) 09/25/2008  . Dyslipidemia, goal LDL below 70 09/25/2008  . GASTRITIS 09/25/2008  . DIVERTICULOSIS, COLON 09/25/2008  . RECTAL MASS 09/25/2008    Past Surgical History:  Procedure Laterality Date  . APPENDECTOMY    . AV FISTULA PLACEMENT Right 06/26/2017   Procedure: ARTERIOVENOUS (AV) FISTULA CREATION VERSUS GRAFT  RIGHT  ARM;  Surgeon: Conrad [place], MD;  Location[facility];  Service: Vascular;  Laterality: Right;  . AV FISTULA PLACEMENT Right 10/31/2017   Procedure: INSERTION OF ARTERIOVENOUS (AV) ARTEGRAFT,  RIGHT UPPER ARM;  Surgeon: Conrad [place], MD;  Location: [facility];  Service: Vascular;  Laterality: Right;  . CARDIAC DEFIBRILLATOR PLACEMENT  2010   St. Jude ICD  . COLECTOMY  ~ 2003   "for Crohn's" ascending colon.    . CORONARY STENT INTERVENTION N/A 11/25/2016   Procedure: Coronary Stent Intervention;  Surgeon: Leonie Man, MD;  Location: [facility];  Service: Cardiovascular;  Laterality: N/A;  . EP IMPLANTABLE DEVICE N/A 09/14/2016   Procedure: ICD Generator Changeout;  Surgeon: Deboraha Sprang, MD;  Location: [facility];  Service: Cardiovascular;  Laterality: N/A;  . ESOPHAGOGASTRODUODENOSCOPY N/A 07/12/2017   Procedure: ESOPHAGOGASTRODUODENOSCOPY (EGD);  Surgeon: Jerene Bears, MD;  Location: [facility] ENDOSCOPY;  Service: Gastroenterology;  Laterality: N/A;  VAD pt so VAD team will need to accompany pt.   [name] FETAL SURGERY FOR CONGENITAL HERNIA     ????? pt has no knowledge of this..    . INGUINAL HERNIA REPAIR    . INSERTION OF DIALYSIS CATHETER Right 06/26/2017   Procedure: INSERTION OF TUNNELED  DIALYSIS CATHETER RIGHT INTERNAL JUGULAR;  Surgeon: Conrad [place], MD;  Location: [facility];  Service: Vascular;  Laterality: Right;  . INSERTION OF IMPLANTABLE LEFT VENTRICULAR ASSIST DEVICE N/A 06/12/2017   Procedure: INSERTION OF IMPLANTABLE LEFT VENTRICULAR ASSIST [place] 3;  Surgeon: Ivin Poot, MD;  Location: [facility];  Service: Open Heart Surgery;  Laterality: N/A;  HM3 LVAD  CIRC ARREST  NITRIC OXIDE  . IR THORACENTESIS ASP PLEURAL SPACE W/IMG GUIDE  06/28/2017  . LIGATION OF ARTERIOVENOUS  FISTULA Right 10/31/2017   Procedure: LIGATION OF BRACHIO-BASILIC VEIN TRANSPOSITION RIGHT ARM;  Surgeon: Conrad Datto, MD;  Location: [facility];  Service: Vascular;  Laterality: Right;  .  MULTIPLE EXTRACTIONS WITH ALVEOLOPLASTY N/A 06/08/2017   Procedure: MULTIPLE EXTRACTION WITH ALVEOLOPLASTY AND PRE PROSTHETIC SURGERY AS NEEDED;  Surgeon: Lenn Cal, DDS;  Location: [facility];  Service: Oral Surgery;  Laterality: N/A;  . RIGHT HEART CATH N/A 06/07/2017   Procedure: RIGHT HEART CATH;  Surgeon: Larey Dresser, MD;  Location: [facility];  Service: Cardiovascular;  Laterality: N/A;  . RIGHT/LEFT HEART CATH AND CORONARY ANGIOGRAPHY N/A 11/23/2016   Procedure: Right/Left Heart Cath and Coronary Angiography;  Surgeon: Larey Dresser, MD;  Location: [facility];  Service: Cardiovascular;  Laterality: N/A;  . TEE WITHOUT CARDIOVERSION N/A 06/12/2017   Procedure: TRANSESOPHAGEAL ECHOCARDIOGRAM (TEE);  Surgeon: Prescott Gum, Collier Salina, MD;  Location: [facility];  Service: Open Heart Surgery;  Laterality: N/A;  . THROMBECTOMY AND REVISION OF ARTERIOVENTOUS (AV) GORETEX  GRAFT Right 12/12/2017   Procedure: THROMBECTOMY ARTERIOVENTOUS (AV) GORETEX  GRAFT RIGHT UPPER ARM;  Surgeon: Conrad , MD;  Location: [facility];  Service: Vascular;  Laterality: Right;  Home Medications    Prior to Admission medications   Medication Sig Start Date End Date Taking? Authorizing Provider  amLODipine (NORVASC) 2.5 MG tablet Take 1 tablet (2.5 mg total) by mouth daily. 01/02/18 04/02/18  Larey Dresser, MD  aspirin EC 81 MG tablet Take 1 tablet (81 mg total) by mouth daily. 07/26/17   Larey Dresser, MD  atorvastatin (LIPITOR) 40 MG tablet Take 1 tablet (40 mg total) daily at 6 PM by mouth. 07/16/17   Bhagat, Bhavinkumar, PA  docusate sodium (COLACE) 100 MG capsule Take 2 capsules (200 mg total) by mouth daily. 07/03/17   Clegg, Amy D, NP  dronabinol (MARINOL) 2.5 MG capsule Take 1 capsule (2.5 mg total) by mouth 2 (two) times daily before lunch and supper. 09/16/17   Daune Perch, NP  hydrALAZINE (APRESOLINE) 25 MG tablet Take 1.5 tablets (37.5 mg total) by mouth 3 (three) times daily. Take  on non-hd days 10/24/17   Larey Dresser, MD  insulin aspart (NOVOLOG) 100 UNIT/ML injection Inject 3 Units into the skin 3 (three) times daily with meals. Patient taking differently: Inject 5 Units into the skin 3 (three) times daily with meals.  07/03/17   Clegg, Amy D, NP  insulin glargine (LANTUS) 100 UNIT/ML injection Inject 0.05 mLs (5 Units total) into the skin daily. Patient taking differently: Inject 10 Units into the skin daily.  07/03/17   Clegg, Amy D, NP  LORazepam (ATIVAN) 0.5 MG tablet Take 1 tablet (0.5 mg total) by mouth every 12 (twelve) hours as needed for anxiety or sleep. 08/04/17   Clegg, Amy D, NP  metoCLOPramide (REGLAN) 5 MG tablet Take 1 tablet (5 mg total) by mouth 4 (four) times daily -  before meals and at bedtime. Patient taking differently: Take 10 mg by mouth 3 (three) times daily.  08/04/17   Clegg, Amy D, NP  ondansetron (ZOFRAN ODT) 4 MG disintegrating tablet Take 1 tablet (4 mg total) by mouth every 6 (six) hours as needed for nausea or vomiting. 01/10/17   Esterwood, Amy S, PA-C  pantoprazole (PROTONIX) 40 MG tablet Take 40 mg by mouth daily.    [provider]  promethazine (PHENERGAN) 12.5 MG tablet Take 1 tablet (12.5 mg total) by mouth every 6 (six) hours as needed for nausea or vomiting. Patient taking differently: Take 25 mg by mouth every 6 (six) hours as needed for nausea or vomiting.  09/16/17   Daune Perch, NP  sildenafil (REVATIO) 20 MG tablet Take 1 tablet (20 mg total) by mouth 3 (three) times daily. 07/26/17   Larey Dresser, MD  warfarin (COUMADIN) 5 MG tablet Take 5 mg (1 tablet) daily except 7.5 mg (1 and 1/2 tablets) on Tuesday and Thursday    [provider]    Family History Family History  Problem Relation Age of Onset  . Colon polyps Mother   . Diabetes Mother   . Heart attack Father   . Diabetes Father   . Stroke Paternal Grandfather   . Colitis Sister   . Heart disease Brother   . Colitis Sister   . Colon  cancer Neg Hx     Social History Social History   Tobacco Use  . Smoking status: Never Smoker  . Smokeless tobacco: Never Used  Substance Use Topics  . Alcohol use: No  . Drug use: No     Allergies   Metformin and related   Review of Systems Review of Systems Ten systems reviewed and  are negative for acute change, except as noted in the HPI.    Physical Exam Updated Vital Signs Ht 5' 8"  (1.727 m)   Wt 81.6 kg (180 lb)   BMI 27.37 kg/m   Physical Exam  Constitutional: He appears well-developed and well-nourished. No distress.  HENT:  Head: Normocephalic and atraumatic.  Eyes: Conjunctivae are normal. No scleral icterus.  Neck: Normal range of motion. Neck supple.  Cardiovascular:  Whirring noise of lvad  Pulmonary/Chest: Effort normal and breath sounds normal. No respiratory distress.  Abdominal: Soft. There is no tenderness.  Musculoskeletal: He exhibits no edema.  Neurological: He is alert.  Skin: Skin is warm and dry. He is not diaphoretic.  Psychiatric: His behavior is normal.  Nursing note and vitals reviewed.    ED Treatments / Results  Labs (all labs ordered are listed, but only abnormal results are displayed) Labs Reviewed  COMPREHENSIVE METABOLIC PANEL  LIPASE, BLOOD  CBC WITH DIFFERENTIAL/PLATELET  PROTIME-INR  LACTATE DEHYDROGENASE  I-STAT TROPONIN, ED    EKG None  Radiology No results found.  Procedures Procedures (including critical care time)  Medications Ordered in ED Medications  hydrALAZINE (APRESOLINE) injection 10 mg (has no administration in time range)  haloperidol lactate (HALDOL) injection 2.5 mg (has no administration in time range)     Initial Impression / Assessment and Plan / ED Course  I have reviewed the triage vital signs and the nursing notes.  Pertinent labs & imaging results that were available during my care of the patient were reviewed by me and considered in my medical decision making (see chart for  details).     Patient attended by the LVAD team who will admit him.  He has been taking Ativan and Reglan without improvement in his symptoms.  Patient's blood pressure elevated and he will need treatment.  I have offered to treat his nausea with 2.5 mg Haldol.   Final Clinical Impressions(s) / ED Diagnoses   Final diagnoses:  Gastroparesis    ED Discharge Orders    None       Margarita Mail, PA-C 01/19/18 1452    Duffy Bruce, MD 01/20/18 [phone]

## 2018-01-19 NOTE — ED Triage Notes (Signed)
Pt arrives to ED from dialysis clinic (after less than 30 mins) with complaints of nausea and vomiting since days ago. EMS reports dialysis nurses called because pt's LVAD "sounded different". Pt alert and oriented, ambulatory baseline. LVAD team contacted upon pt arrival to ED. Pt placed in position of comfort with bed locked and lowered, call bell in reach.

## 2018-01-19 NOTE — H&P (Addendum)
Advanced Heart Failure VAD History and Physical Note   PCP-Cardiologist: No primary care provider on file.  HF/VAD: Dr Aundra Dubin  Reason for Admission: Nausea and Vomiting   HPI:    [name] is a 52 year old with a history of CAD s/p anterior MI in 2010 and NSTEMI in 3/18 with DES to pRCA and mLAD and ischemic cardiomyopathy now s/p Heartmate 3 LVAD and ESRD. HMIII VAD placed 06/2017.  AVF placed 10/2017 but unable to use. Temporary HD catheter in place.   GI Events -07/2017 Hospital admit twice in November for N/Vdiabetic gastroparesis. GI consulted. Placed on reglan and erythromycin -09/2017 Hospital admit for N/Vdiabetic gastroparesis.  Given phenergan suppositories and erythromycin -12/2017  Hospital admit for N/Vdiabetic gastroparesis.   VAD Drive Line Events: -09/7492>> poss drive line infection, CT ABD neg, ID consult-doxy -11/09/16>> admit for poss drive line infection, IV abx -03/24/17>> doxy for poss drive line infection -05/04/17>> drive line debridement with wound-vac -06/2017>> drive line +proteus, IV abx, Bactrim x14 days  He was in his usual state until Wednesday. He developed recurrent N/V.Missed dialysis on Wednesday due N/V. Yesterday he used a phenergan suppository and was able to go to hemodialysis. He returned to dialysis today but due to N/V he was sent to the [facility]. Was only on machine for 20 minutes. Feels very weak. He has not had coumadin or any medications since Tuesday. Unable to tolerate eating or drinking food due to nausea. Denies fever or chills. No bleeding problems. No falls at home. No SOB, orthopnea or PND. No bleeding.   LVAD INTERROGATION:  HeartMate II LVAD:  Flow 2.9 liters/min, speed 5500, power 4.4, PI 10.1.  10-20 PI events    Review of Systems: [y] = yes, [ ]  = no   General: Weight gain [ ] ; Weight loss [ ] ; Anorexia [ ] ; Fatigue [ Y]; Fever [ ] ; Chills [ ] ; Weakness [Y ]  Cardiac: Chest pain/pressure [ ] ; Resting SOB [ ] ; Exertional SOB [ ] ;  Orthopnea [ ] ; Pedal Edema [ ] ; Palpitations [ ] ; Syncope [ ] ; Presyncope [ ] ; Paroxysmal nocturnal dyspnea[ ]   Pulmonary: Cough [ ] ; Wheezing[ ] ; Hemoptysis[ ] ; Sputum [ ] ; Snoring [ ]   GI: Vomiting[Y ]; Dysphagia[ ] ; Melena[ ] ; Hematochezia [ ] ; Heartburn[ ] ; Abdominal pain [ ] ; Constipation [ ] ; Diarrhea [ ] ; BRBPR [ ]   GU: Hematuria[ ] ; Dysuria [ ] ; Nocturia[ ]   Vascular: Pain in legs with walking [ ] ; Pain in feet with lying flat [ ] ; Non-healing sores [ ] ; Stroke [ ] ; TIA [ ] ; Slurred speech [ ] ;  Neuro: Headaches[ ] ; Vertigo[ ] ; Seizures[ ] ; Paresthesias[ ] ;Blurred vision [ ] ; Diplopia [ ] ; Vision changes [ ]   Ortho/Skin: Arthritis [Y ]; Joint pain [ Y]; Muscle pain [ ] ; Joint swelling [ ] ; Back Pain [ ] ; Rash [ ]   Psych: Depression[Y ]; Anxiety[ ]   Heme: Bleeding problems [ ] ; Clotting disorders [ ] ; Anemia [ ]   Endocrine: Diabetes [Y ]; Thyroid dysfunction[ ]     Home Medications Prior to Admission medications   Medication Sig Start Date End Date Taking? Authorizing Provider  amLODipine (NORVASC) 2.5 MG tablet Take 1 tablet (2.5 mg total) by mouth daily. 01/02/18 04/02/18  Larey Dresser, MD  aspirin EC 81 MG tablet Take 1 tablet (81 mg total) by mouth daily. 07/26/17   Larey Dresser, MD  atorvastatin (LIPITOR) 40 MG tablet Take 1 tablet (40 mg total) daily at 6 PM  by mouth. 07/16/17   Bhagat, Crista Luria, PA  docusate sodium (COLACE) 100 MG capsule Take 2 capsules (200 mg total) by mouth daily. 07/03/17   Clegg, Amy D, NP  dronabinol (MARINOL) 2.5 MG capsule Take 1 capsule (2.5 mg total) by mouth 2 (two) times daily before lunch and supper. 09/16/17   Daune Perch, NP  hydrALAZINE (APRESOLINE) 25 MG tablet Take 1.5 tablets (37.5 mg total) by mouth 3 (three) times daily. Take on non-hd days 10/24/17   Larey Dresser, MD  insulin aspart (NOVOLOG) 100 UNIT/ML injection Inject 3 Units into the skin 3 (three) times daily with meals. Patient taking differently: Inject 5 Units into the  skin 3 (three) times daily with meals.  07/03/17   Clegg, Amy D, NP  insulin glargine (LANTUS) 100 UNIT/ML injection Inject 0.05 mLs (5 Units total) into the skin daily. Patient taking differently: Inject 10 Units into the skin daily.  07/03/17   Clegg, Amy D, NP  LORazepam (ATIVAN) 0.5 MG tablet Take 1 tablet (0.5 mg total) by mouth every 12 (twelve) hours as needed for anxiety or sleep. 08/04/17   Clegg, Amy D, NP  metoCLOPramide (REGLAN) 5 MG tablet Take 1 tablet (5 mg total) by mouth 4 (four) times daily -  before meals and at bedtime. Patient taking differently: Take 10 mg by mouth 3 (three) times daily.  08/04/17   Clegg, Amy D, NP  ondansetron (ZOFRAN ODT) 4 MG disintegrating tablet Take 1 tablet (4 mg total) by mouth every 6 (six) hours as needed for nausea or vomiting. 01/10/17   Esterwood, Amy S, PA-C  pantoprazole (PROTONIX) 40 MG tablet Take 40 mg by mouth daily.    [provider]  promethazine (PHENERGAN) 12.5 MG tablet Take 1 tablet (12.5 mg total) by mouth every 6 (six) hours as needed for nausea or vomiting. Patient taking differently: Take 25 mg by mouth every 6 (six) hours as needed for nausea or vomiting.  09/16/17   Daune Perch, NP  sildenafil (REVATIO) 20 MG tablet Take 1 tablet (20 mg total) by mouth 3 (three) times daily. 07/26/17   Larey Dresser, MD  warfarin (COUMADIN) 5 MG tablet Take 5 mg (1 tablet) daily except 7.5 mg (1 and 1/2 tablets) on Tuesday and Thursday    [provider]    Past Medical History: Past Medical History:  Diagnosis Date  . Angina   . ASCVD (arteriosclerotic cardiovascular disease)    , Anterior infarction 2005, LAD diagonal bifurcation intervention 03/2004  . Automatic implantable cardiac defibrillator -St. Jude's       . Benign neoplasm of colon   . CHF (congestive heart failure) ([place])   . Chronic systolic heart failure ([place])   . Coronary artery disease     Widely patent previously placed stents in the left anterior     . Crohn's disease ([place])   . 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 Dialysis patient ([place])   . Dyspnea   . Gastroparesis   . GERD (gastroesophageal reflux disease)   . High cholesterol   . Hyperlipidemia   . Hypersomnolent    Previous diagnosis of narcolepsy  . Hypertension, essential   . Ischemic cardiomyopathy    Ejection fraction 15-20% catheterization 2010  . Type II or unspecified type diabetes mellitus without mention of complication, not stated as uncontrolled   . Unspecified gastritis and gastroduodenitis without mention of hemorrhage     Past Surgical History: Past Surgical History:  Procedure Laterality Date  . APPENDECTOMY    . AV FISTULA PLACEMENT Right 06/26/2017   Procedure: ARTERIOVENOUS (AV) FISTULA CREATION VERSUS GRAFT RIGHT  ARM;  Surgeon: Conrad [place], MD;  Location: [facility] OR;  Service: Vascular;  Laterality: Right;  . AV FISTULA PLACEMENT Right 10/31/2017   Procedure: INSERTION OF ARTERIOVENOUS (AV) ARTEGRAFT,  RIGHT UPPER ARM;  Surgeon: Conrad [place], MD;  Location: [place];  Service: Vascular;  Laterality: Right;  . CARDIAC DEFIBRILLATOR PLACEMENT  2010   St. Jude ICD  . COLECTOMY  ~ 2003   "for Crohn's" ascending colon.    . CORONARY STENT INTERVENTION N/A 11/25/2016   Procedure: Coronary Stent Intervention;  Surgeon: Leonie Man, MD;  Location: [place] CV LAB;  Service: Cardiovascular;  Laterality: N/A;  . EP IMPLANTABLE DEVICE N/A 09/14/2016   Procedure: ICD Generator Changeout;  Surgeon: Deboraha Sprang, MD;  Location: [place] CV LAB;  Service: Cardiovascular;  Laterality: N/A;  . ESOPHAGOGASTRODUODENOSCOPY N/A 07/12/2017   Procedure: ESOPHAGOGASTRODUODENOSCOPY (EGD);  Surgeon: Jerene Bears, MD;  Location: [facility] ENDOSCOPY;  Service: Gastroenterology;  Laterality: N/A;  VAD pt so VAD team will need to accompany pt.   [name] FETAL SURGERY FOR CONGENITAL HERNIA     ????? pt has no knowledge of this..    . INGUINAL HERNIA REPAIR    .  INSERTION OF DIALYSIS CATHETER Right 06/26/2017   Procedure: INSERTION OF TUNNELED  DIALYSIS CATHETER RIGHT INTERNAL JUGULAR;  Surgeon: Conrad [place], MD;  Location: [place];  Service: Vascular;  Laterality: Right;  . INSERTION OF IMPLANTABLE LEFT VENTRICULAR ASSIST DEVICE N/A 06/12/2017   Procedure: INSERTION OF IMPLANTABLE LEFT VENTRICULAR ASSIST [place] 3;  Surgeon: Ivin Poot, MD;  Location: [place];  Service: Open Heart Surgery;  Laterality: N/A;  HM3 LVAD  CIRC ARREST  NITRIC OXIDE  . IR THORACENTESIS ASP PLEURAL SPACE W/IMG GUIDE  06/28/2017  . LIGATION OF ARTERIOVENOUS  FISTULA Right 10/31/2017   Procedure: LIGATION OF BRACHIO-BASILIC VEIN TRANSPOSITION RIGHT ARM;  Surgeon: Conrad [place], MD;  Location: [place];  Service: Vascular;  Laterality: Right;  . MULTIPLE EXTRACTIONS WITH ALVEOLOPLASTY N/A 06/08/2017   Procedure: MULTIPLE EXTRACTION WITH ALVEOLOPLASTY AND PRE PROSTHETIC SURGERY AS NEEDED;  Surgeon: Lenn Cal, DDS;  Location: [place];  Service: Oral Surgery;  Laterality: N/A;  . RIGHT HEART CATH N/A 06/07/2017   Procedure: RIGHT HEART CATH;  Surgeon: Larey Dresser, MD;  Location: [place] CV LAB;  Service: Cardiovascular;  Laterality: N/A;  . RIGHT/LEFT HEART CATH AND CORONARY ANGIOGRAPHY N/A 11/23/2016   Procedure: Right/Left Heart Cath and Coronary Angiography;  Surgeon: Larey Dresser, MD;  Location: [place] CV LAB;  Service: Cardiovascular;  Laterality: N/A;  . TEE WITHOUT CARDIOVERSION N/A 06/12/2017   Procedure: TRANSESOPHAGEAL ECHOCARDIOGRAM (TEE);  Surgeon: Prescott Gum, Collier [place], MD;  Location: [place];  Service: Open Heart Surgery;  Laterality: N/A;  . THROMBECTOMY AND REVISION OF ARTERIOVENTOUS (AV) GORETEX  GRAFT Right 12/12/2017   Procedure: THROMBECTOMY ARTERIOVENTOUS (AV) GORETEX  GRAFT RIGHT UPPER ARM;  Surgeon: Conrad [place], MD;  Location: [facility] OR;  Service: Vascular;  Laterality: Right;    Family History: Family History  Problem Relation Age of  Onset  . Colon polyps Mother   . Diabetes Mother   . Heart attack Father   . Diabetes Father   . Stroke Paternal Grandfather   . Colitis Sister   . Heart disease Brother   . Colitis Sister   .  Colon cancer Neg Hx     Social History: Social History   Socioeconomic History  . Marital status: Married    Spouse name: Not on file  . Number of children: Not on file  . Years of education: Not on file  . Highest education level: Not on file  Occupational History  . Not on file  Social Needs  . Financial resource strain: Not on file  . Food insecurity:    Worry: Not on file    Inability: Not on file  . Transportation needs:    Medical: Not on file    Non-medical: Not on file  Tobacco Use  . Smoking status: Never Smoker  . Smokeless tobacco: Never Used  Substance and Sexual Activity  . Alcohol use: No  . Drug use: No  . Sexual activity: Never  Lifestyle  . Physical activity:    Days per week: Not on file    Minutes per session: Not on file  . Stress: Not on file  Relationships  . Social connections:    Talks on phone: Not on file    Gets together: Not on file    Attends religious service: Not on file    Active member of club or organization: Not on file    Attends meetings of clubs or organizations: Not on file    Relationship status: Not on file  Other Topics Concern  . Not on file  Social History Narrative  . Not on file    Allergies:  Allergies  Allergen Reactions  . Metformin And Related Diarrhea    Objective:    Vital Signs:   Weight:  [180 lb (81.6 kg)] 180 lb (81.6 kg) (05/17 1407)   Filed Weights   01/19/18 1407  Weight: 180 lb (81.6 kg)    Mean arterial Pressure 122  Physical Exam    General:  Appear chronically ill. Weak  HEENT: Normal anicteric Neck: supple. JVP 9-10 . Carotids 2+ bilat; no bruits. No lymphadenopathy or thyromegaly appreciated. Cor: Mechanical heart sounds with LVAD hum present. R HD catheter.  Lungs: Clear no wheeze.    Abdomen: soft, nontender, nondistended. No hepatosplenomegaly. No bruits or masses. Good bowel sounds. Driveline: C/D/I; securement device intact and driveline incorporated Extremities: no cyanosis, clubbing, rash, edema   Neuro: alert & orientedx3, cranial nerves grossly intact. moves all 4 extremities w/o difficulty. Affect flat   Telemetry   NSR artifact with HMIII Personally reviewed   EKG   N/A  Labs    Basic Metabolic Panel: No results for input(s): NA, K, CL, CO2, GLUCOSE, BUN, CREATININE, CALCIUM, MG, PHOS in the last 168 hours.  Liver Function Tests: No results for input(s): AST, ALT, ALKPHOS, BILITOT, PROT, ALBUMIN in the last 168 hours. No results for input(s): LIPASE, AMYLASE in the last 168 hours. No results for input(s): AMMONIA in the last 168 hours.  CBC: No results for input(s): WBC, NEUTROABS, HGB, HCT, MCV, PLT in the last 168 hours.  Cardiac Enzymes: No results for input(s): CKTOTAL, CKMB, CKMBINDEX, TROPONINI in the last 168 hours.  BNP: BNP (last 3 results) Recent Labs    06/18/17 2305 06/26/17 0004 07/03/17 0037  BNP 746.0* 1,409.3* 758.9*    ProBNP (last 3 results) No results for input(s): PROBNP in the last 8760 hours.   CBG: No results for input(s): GLUCAP in the last 168 hours.  Coagulation Studies: Recent Labs    01/17/18  INR 3.0    Other results: EKG: N/A   Imaging  No results found.    Patient Profile:   [name] is a 52 year old with a history of CAD s/p anterior MI in 2010 and NSTEMI in 3/18 with DES to pRCA and mLAD and ischemic cardiomyopathy now s/p Heartmate 3 LVAD and ESRD. HMIII VAD placed 06/2017.  AVF placed 10/2017 but unable to use. Temporary HD catheter in place.   Admitted from ED with N/V   Assessment/Plan:    1. Intractable Nausea/Vomiting secondary to gastroparesis Continue reglan, phenergan, and ativan.  Restart erythromycin 250 mg three times a day for gastroparesis. He has been on it We  have had some with this in the past. He has outpatient follow up next week with Dr Derrill Kay on 01/24/18 [facility] Gastroparesis Specialist) 2. ESRD Consult Nephrology Last HD was 5/15. He did not tolerate HD today. 3. Chronic Systolic Heart Failure, HMIII 2018 He has not had coumadin since 5/14. Check INR now.  Continue 81 mg aspirin daily.  Volume status per HD.  Restart po hydralazine when he tolerates po medications.  No BB.  4. DM,insulin dependent Continue home insulin. Add sliding scale.  5. HTN Start prn IV hydralazine. Restart po hydralazine once he is able to tolerate po medications.   IV team consulted for stat IV. Stat midline.    I reviewed the LVAD parameters from today, and compared the results to the patient's prior recorded data.  No programming changes were made.  The LVAD is functioning within specified parameters.  The patient performs LVAD self-test daily.  LVAD interrogation was negative for any significant power changes, alarms or PI events/speed drops.  LVAD equipment check completed and is in good working order.  Back-up equipment present.   LVAD education done on emergency procedures and precautions and reviewed exit site care.  Length of Stay: 0  Darrick Grinder, NP 01/19/2018, 2:42 PM  VAD Team Pager [phone] (7am - 7am) +++VAD ISSUES ONLY+++   Advanced Heart Failure Team Pager [phone] (M-F; [place])  Please contact [facility] for night-coverage after hours (4p -7a ) and weekends on [email or web address] for all non- LVAD Issues  Patient seen and examined with Darrick Grinder, NP. We discussed all aspects of the encounter. I agree with the assessment and plan as stated above.   52 y/o VAD patient with ESRD and refractory gastroparesis. No readmitted with recurrent gastroparesis with inability to take po x 3 days. Unable to tolerate HD today. Volume status mildly elevated.  Labs pending. If INR ,1.8 will start heparin. Treat gastroparesis with reglan, phenergan and erythromycin.  Will give IV hydralazine for elevated MAPs. Discussed dosing with PharmD personally. VAD interrogated personally. Parameters stable.  On exam weak appearing. JVP moderately elevated but NAD. LVAD hum ok. Ab benign Trace edema   [name], MD  4:49 PM

## 2018-01-19 NOTE — ED Notes (Signed)
LVAD coordinator and NP bedside for evaluation

## 2018-01-19 NOTE — Progress Notes (Signed)
LVAD Coordinator Rounding Note:  Admitted 01/19/18 from the ED for intractable N/V.   HM III LVAD implanted on 06/12/17 by Dr. Prescott Gum under Destination Therapy criteria due to renal insufficiency.   Vital signs: Temp: 97.8 HR:  80 Doppler Pressure:  Automatic BP:  159/105 (121) O2 Sat: 99% on RA Wt: 180 lbs   LVAD interrogation reveals:  Speed: 5500 Flow: 2.9 Power:  4.4 PI: 10.1 Alarms: none  Events:  none Hematocrit: 34 Fixed speed: 5500 Low speed limit: 5200  Drive Line: left abdominal exit site sorbaview dressing dry and intact; anchor in place and accurately applied. Weekly dressing changes; next dressing change is due 01/23/18.  Labs:  LDH trend:   INR trend:   Anticoagulation Plan: -INR Goal: 2.0 - 3.0 -ASA Dose: 81 mg daily  Device: -St Jude dual  -Therapies: on at 200 bpm - Monitor: on VT 150 bpm  Adverse Events on VAD: - ESRD post LVAD; started CVVH 06/15/17; HD started 06/20/17 - Hospitalization 11/5 - 07/16/17 > gastroparesis diagnosis after EGD - Hospitalization 11/26 - 08/04/17 > gastroparesis - Hospitalization 1/8 - 09/16/17>gastroparesis - referred to Dr. Corliss Parish at [facility] - 10/31/17>rightupper arm arteriovenous graft with Artegraft; ligation of right first stage brachial vein transposition  - 12/11/13 elective admission for thrombectomy for AV graft in RUA  - 12/20/17 > intractable N/V - 01/19/18 > intractable N/V sent by EMS from dialysis center   Plan/Recommendations:   1. Please call VAD coordinator for any equipment issues or concerns.  [name] RN, VAD Coordinator 24/7 VAD Pager: [phone]

## 2018-01-20 LAB — BASIC METABOLIC PANEL
ANION GAP: 17 — AB (ref 5–15)
BUN: 43 mg/dL — AB (ref 6–20)
CHLORIDE: 98 mmol/L — AB (ref 101–111)
CO2: 23 mmol/L (ref 22–32)
Calcium: 8.6 mg/dL — ABNORMAL LOW (ref 8.9–10.3)
Creatinine, Ser: 6.65 mg/dL — ABNORMAL HIGH (ref 0.61–1.24)
GFR calc Af Amer: 10 mL/min — ABNORMAL LOW (ref >60)
GFR, EST NON AFRICAN AMERICAN: 9 mL/min — AB (ref >60)
Glucose, Bld: 102 mg/dL — ABNORMAL HIGH (ref 65–99)
POTASSIUM: 4.5 mmol/L (ref 3.5–5.1)
SODIUM: 138 mmol/L (ref 135–145)

## 2018-01-20 LAB — CBC
HCT: 41.5 % (ref 39.0–52.0)
HEMOGLOBIN: 13.2 g/dL (ref 13.0–17.0)
MCH: 28.8 pg (ref 26.0–34.0)
MCHC: 31.8 g/dL (ref 30.0–36.0)
MCV: 90.6 fL (ref 78.0–100.0)
PLATELETS: 269 10*3/uL (ref 150–400)
RBC: 4.58 MIL/uL (ref 4.22–5.81)
RDW: 18.1 % — ABNORMAL HIGH (ref 11.5–15.5)
WBC: 10.3 10*3/uL (ref 4.0–10.5)

## 2018-01-20 LAB — PROTIME-INR
INR: 2.78
Prothrombin Time: 29.1 seconds — ABNORMAL HIGH (ref 11.4–15.2)

## 2018-01-20 LAB — GLUCOSE, CAPILLARY
GLUCOSE-CAPILLARY: 94 mg/dL (ref 65–99)
GLUCOSE-CAPILLARY: 139 mg/dL — AB (ref 65–99)
Glucose-Capillary: 189 mg/dL — ABNORMAL HIGH (ref 65–99)
Glucose-Capillary: 135 mg/dL — ABNORMAL HIGH (ref 65–99)

## 2018-01-20 LAB — LACTATE DEHYDROGENASE: LDH: 223 U/L — AB (ref 98–192)

## 2018-01-20 MED ORDER — WARFARIN SODIUM 5 MG PO TABS
5.0000 mg | ORAL_TABLET | Freq: Once | ORAL | Status: AC
  Administered 2018-01-20: 5 mg via ORAL
  Filled 2018-01-20: qty 1

## 2018-01-20 MED ORDER — HYDRALAZINE HCL 25 MG PO TABS
37.5000 mg | ORAL_TABLET | Freq: Three times a day (TID) | ORAL | Status: DC
  Filled 2018-01-20 (×10): qty 1.5

## 2018-01-20 NOTE — Progress Notes (Signed)
[facility] Progress Note  Subjective: readmitted yest for recurrent n/v. n/v better today only got 30 min of dialysis at op center yest  Vitals:   01/20/18 0640 01/20/18 0658 01/20/18 0750 01/20/18 1159  BP: (!) 96/46  107/68 (!) 77/44  Pulse: (!) 42  87 60  Resp: (!) 0  15 15  Temp:   98.2 F (36.8 C) 98.1 F (36.7 C)  TempSrc:   Oral Oral  SpO2: 99%  99% 99%  Weight:  76.2 kg (167 lb 15.9 oz)    Height:        Inpatient medications: . amLODipine  2.5 mg Oral Daily  . aspirin EC  81 mg Oral Daily  . atorvastatin  40 mg Oral q1800  . docusate sodium  200 mg Oral Daily  . dronabinol  2.5 mg Oral BID AC  . erythromycin  250 mg Oral TID WC  . haloperidol lactate  2.5 mg Intravenous Once  . hydrALAZINE  37.5 mg Oral Q8H  . insulin aspart  3 Units Subcutaneous TID WC  . insulin glargine  5 Units Subcutaneous Daily  . metoCLOPramide  10 mg Oral TID  . pantoprazole  40 mg Oral Daily  . sildenafil  20 mg Oral TID  . sodium chloride flush  10-40 mL Intracatheter Q12H  . warfarin  5 mg Oral ONCE-1800  . Warfarin - Pharmacist Dosing Inpatient   Does not apply q1800    acetaminophen, hydrALAZINE, LORazepam, ondansetron (ZOFRAN) IV, promethazine, sodium chloride flush  Exam: General:NAD, chronically ill appearing male, laying in bed Heart:regular rate, mechanical heart sounds w/LVAD hum Lungs:CTAB anterior/lateraly Abdomen:soft, NTND Extremities:no LE edema Dialysis Access: R IJ TDC, RUA AVG +bruit    Outpatient HD: M[facility] 4h 101mn 400/800 3K/ 2Ca R IJ TDC/RUAAVG (Artegraft, 10/31/17, Dr CBridgett Larsson Hep 8000 edw 80kg including 3.2kg LVAD battery not used while in hospital = edw of 76.8kg in hospital   Assessment: 1.Nausea and vomiting - improving  2. ESRD - usual hd mwf missed most of hd yest plan short hd later tonight 3. Anemia of CKD-hb 13.1 no need for esa 4. Secondary hyperparathyroidism -Continue VDRA 5. HTN - bp's low taking hydral/  norvasc per cardiology 6. Nutrition - Renavite. Nepro 7. Ischemic CM - s/o LDAV & BiV-ICD: per primary 8. Chronic anticoagulation w/LVAD device - coumadin 9. Limited code - no compressions, everything else yes  Plan - plan short HD tonight or possibly tomorrow    RKelly SplinterMD CSligopager [phone]  01/20/2018, 12:06 PM   Recent Labs  Lab 01/19/18 1746 01/20/18 0618  NA 136 138  K 4.2 4.5  CL 95* 98*  CO2 25 23  GLUCOSE 119* 102*  BUN 30* 43*  CREATININE 5.29* 6.65*  CALCIUM 8.8* 8.6*   Recent Labs  Lab 01/19/18 1746  AST 19  ALT 17  ALKPHOS 116  BILITOT 0.8  PROT 7.8  ALBUMIN 3.7   Recent Labs  Lab 01/19/18 1746 01/20/18 0618  WBC 11.1* 10.3  NEUTROABS 9.5*  --   HGB 13.3 13.2  HCT 42.3 41.5  MCV 91.4 90.6  PLT 282 269   Iron/TIBC/Ferritin/ %Sat    Component Value Date/Time   IRON 40 (L) 11/02/2017 0016   TIBC 188 (L) 11/02/2017 0016   FERRITIN 523 (H) 11/02/2017 0016   IRONPCTSAT 21 11/02/2017 0016

## 2018-01-20 NOTE — Plan of Care (Signed)
Continue current care plan 

## 2018-01-20 NOTE — Progress Notes (Signed)
Patient ID: [name], male   DOB: Jun 16, 1966, 52 y.o.   MRN: [id number]   Advanced Heart Failure VAD Team Note  PCP-Cardiologist: No primary care provider on file.   Subjective:    Patient is feeling better this morning, nausea/vomiting has resolved.  Ativan seemed to help the most.  He feels like trying breakfast this morning.   He did not get a full HD yesterday.   INR 2.78 today.   LVAD INTERROGATION:  HeartMate 3 LVAD:   Flow 3.7 liters/min, speed 5500, power 4.1, PI 6.1.  3 PI events.    Objective:    Vital Signs:   Temp:  [98.7 F (37.1 C)-98.9 F (37.2 C)] 98.8 F (37.1 C) (05/18 0333) Pulse Rate:  [42-110] 42 (05/18 0640) Resp:  [0-21] 0 (05/18 0640) BP: (96-157)/(46-107) 96/46 (05/18 0640) SpO2:  [98 %-100 %] 99 % (05/18 0640) Weight:  [167 lb 15.9 oz (76.2 kg)-180 lb (81.6 kg)] 167 lb 15.9 oz (76.2 kg) (05/18 0658)   Mean arterial Pressure 80s  Intake/Output:   Intake/Output Summary (Last 24 hours) at 01/20/2018 0710 Last data filed at 01/20/2018 0407 Gross per 24 hour  Intake 250 ml  Output 0 ml  Net 250 ml     Physical Exam    General:  Well appearing. No resp difficulty HEENT: normal Neck: supple. JVP . Carotids 2+ bilat; no bruits. No lymphadenopathy or thyromegaly appreciated. Cor: Mechanical heart sounds with LVAD hum present. Lungs: clear Abdomen: soft, nontender, nondistended. No hepatosplenomegaly. No bruits or masses. Good bowel sounds. Driveline: C/D/I; securement device intact and driveline incorporated Extremities: no cyanosis, clubbing, rash, edema Neuro: alert & orientedx3, cranial nerves grossly intact. moves all 4 extremities w/o difficulty. Affect pleasant   Telemetry   NSR 90s (personally reviewed)   Labs   Basic Metabolic Panel: Recent Labs  Lab 01/19/18 1746  NA 136  K 4.2  CL 95*  CO2 25  GLUCOSE 119*  BUN 30*  CREATININE 5.29*  CALCIUM 8.8*    Liver Function Tests: Recent Labs  Lab 01/19/18 1746    AST 19  ALT 17  ALKPHOS 116  BILITOT 0.8  PROT 7.8  ALBUMIN 3.7   Recent Labs  Lab 01/19/18 1746  LIPASE 19   No results for input(s): AMMONIA in the last 168 hours.  CBC: Recent Labs  Lab 01/19/18 1746 01/20/18 0618  WBC 11.1* 10.3  NEUTROABS 9.5*  --   HGB 13.3 13.2  HCT 42.3 41.5  MCV 91.4 90.6  PLT 282 269    INR: Recent Labs  Lab 01/17/18 01/19/18 1748 01/20/18 0618  INR 3.0 2.54 2.78    Other results:  EKG:    Imaging   Dg Chest Port 1 View  Result Date: 01/19/2018 CLINICAL DATA:  Intractable nausea and vomiting. EXAM: PORTABLE CHEST 1 VIEW COMPARISON:  12/11/2017 FINDINGS: There are changes from prior cardiac surgery with placement of a left ventricular assist device. The position of the assist device is stable from prior exam. There is a stable left anterior chest wall AICD. Right internal jugular tunneled dual lumen central venous catheter is stable, tip projecting in the right atrium. Cardiac silhouette is normal in size. Mediastinal or hilar masses. No convincing adenopathy. Clear lungs.  No pleural effusion or pneumothorax. Skeletal structures are grossly intact. IMPRESSION: No acute cardiopulmonary disease. Electronically Signed   By: Lajean Manes M.D.   On: 01/19/2018 16:21      Medications:     Scheduled Medications: .  amLODipine  2.5 mg Oral Daily  . aspirin EC  81 mg Oral Daily  . atorvastatin  40 mg Oral q1800  . docusate sodium  200 mg Oral Daily  . dronabinol  2.5 mg Oral BID AC  . erythromycin  250 mg Oral TID WC  . haloperidol lactate  2.5 mg Intravenous Once  . hydrALAZINE  37.5 mg Oral Q8H  . insulin aspart  3 Units Subcutaneous TID WC  . insulin glargine  5 Units Subcutaneous Daily  . metoCLOPramide  10 mg Oral TID  . pantoprazole  40 mg Oral Daily  . sildenafil  20 mg Oral TID  . sodium chloride flush  10-40 mL Intracatheter Q12H  . Warfarin - Pharmacist Dosing Inpatient   Does not apply q1800     Infusions:   PRN  Medications:  acetaminophen, hydrALAZINE, LORazepam, ondansetron (ZOFRAN) IV, promethazine, sodium chloride flush    Assessment/Plan:    1. Intractable nausea/vomiting secondary to gastroparesis: Recurrent symptoms.  He is now better with Ativan seeming to help the most.  He was started on a course of erythromycin again yesterday as well.   - Try diet today.  - Continue reglan, phenergan, and ativan.  - Restart ederythromycin 250 mg three times a day for gastroparesis. Would continue for 2 week course.  - He has outpatient follow up next week with Dr Derrill Kay on 01/24/18 [facility]).  2. ESRD: Did not have a full HD yesterday.  - Consult Nephrology, will likely need HD today.  3. Chronic Systolic Heart Failure, HMIII 2018: He has been seen at [facility], to be worked up for heart/kidney transplant.  INR 2.78 today.  He remains on ASA 81 + warfarin with stable LDH.  - Volume removal via HD.  - Continue hydralazine and amlodipine on non-HD days.  4. DM,insulin dependent - Continue home insulin. Add sliding scale.  5. HTN: MAP 80s today, should be able to take his po meds.    I reviewed the LVAD parameters from today, and compared the results to the patient's prior recorded data.  No programming changes were made.  The LVAD is functioning within specified parameters.  The patient performs LVAD self-test daily.  LVAD interrogation was negative for any significant power changes, alarms or PI events/speed drops.  LVAD equipment check completed and is in good working order.  Back-up equipment present.   LVAD education done on emergency procedures and precautions and reviewed exit site care.  Length of Stay: 1  Loralie Champagne, MD 01/20/2018, 7:10 AM  VAD Team --- VAD ISSUES ONLY--- Pager [phone] (7am - 7am)  Advanced Heart Failure Team  Pager [phone] (M-F; 7a - 4p)  Please contact [facility] for night-coverage after hours (4p -7a ) and weekends on [email or web address]

## 2018-01-20 NOTE — Progress Notes (Signed)
[place] for warfarin Indication: history of DVT, LVAD  Allergies  Allergen Reactions  . Metformin And Related Diarrhea    Patient Measurements: Height: 5' 8"  (172.7 cm) Weight: 167 lb 15.9 oz (76.2 kg) IBW/kg (Calculated) : 68.4   Vital Signs: Temp: 98.2 F (36.8 C) (05/18 0750) Temp Source: Oral (05/18 0750) BP: 107/68 (05/18 0750) Pulse Rate: 87 (05/18 0750)  Labs: Recent Labs    01/19/18 1746 01/19/18 1748 01/20/18 0618  HGB 13.3  --  13.2  HCT 42.3  --  41.5  PLT 282  --  269  LABPROT  --  27.1* 29.1*  INR  --  2.54 2.78  CREATININE 5.29*  --  6.65*    Estimated Creatinine Clearance: 12.4 mL/min (A) (by C-G formula based on SCr of 6.65 mg/dL (H)).   Medical History: Past Medical History:  Diagnosis Date  . Angina   . ASCVD (arteriosclerotic cardiovascular disease)    , Anterior infarction 2005, LAD diagonal bifurcation intervention 03/2004  . Automatic implantable cardiac defibrillator -[employer]       . Benign neoplasm of colon   . CHF (congestive heart failure) ([place])   . Chronic systolic heart failure ([place])   . Coronary artery disease     Widely patent previously placed stents in the left anterior   . Crohn's disease ([place])   . 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 Dialysis patient ([place])   . Dyspnea   . Gastroparesis   . GERD (gastroesophageal reflux disease)   . High cholesterol   . Hyperlipidemia   . Hypersomnolent    Previous diagnosis of narcolepsy  . Hypertension, essential   . Ischemic cardiomyopathy    Ejection fraction 15-20% catheterization 2010  . Type II or unspecified type diabetes mellitus without mention of complication, not stated as uncontrolled   . Unspecified gastritis and gastroduodenitis without mention of hemorrhage    Assessment: 52 year old male with LVAD admitted for intractable nausea and vomiting. Pharmacy consulted to dose coumadin (patient noted on  erythromycin) -INR= 2.78  Pta warfarin dose 77m daily except 7.554mTuesday and Thursday.    Goal of Therapy:  INR 2-3 Monitor platelets by anticoagulation protocol: Yes   Plan:  -Coumadin 40m53mo today -Daily PT/INR  AndHildred LaserharmD Clinical Pharmacist 01/20/2018 10:45 AM

## 2018-01-21 LAB — CBC
HCT: 40.1 % (ref 39.0–52.0)
Hemoglobin: 12.9 g/dL — ABNORMAL LOW (ref 13.0–17.0)
MCH: 28.7 pg (ref 26.0–34.0)
MCHC: 32.2 g/dL (ref 30.0–36.0)
MCV: 89.3 fL (ref 78.0–100.0)
Platelets: 241 10*3/uL (ref 150–400)
RBC: 4.49 MIL/uL (ref 4.22–5.81)
RDW: 17.4 % — ABNORMAL HIGH (ref 11.5–15.5)
WBC: 8 10*3/uL (ref 4.0–10.5)

## 2018-01-21 LAB — PROTIME-INR
INR: 3.53
Prothrombin Time: 35.1 seconds — ABNORMAL HIGH (ref 11.4–15.2)

## 2018-01-21 LAB — BASIC METABOLIC PANEL
Anion gap: 15 (ref 5–15)
BUN: 60 mg/dL — ABNORMAL HIGH (ref 6–20)
CO2: 24 mmol/L (ref 22–32)
Calcium: 8 mg/dL — ABNORMAL LOW (ref 8.9–10.3)
Chloride: 95 mmol/L — ABNORMAL LOW (ref 101–111)
Creatinine, Ser: 8.76 mg/dL — ABNORMAL HIGH (ref 0.61–1.24)
GFR calc Af Amer: 7 mL/min — ABNORMAL LOW (ref >60)
GFR calc non Af Amer: 6 mL/min — ABNORMAL LOW (ref >60)
Glucose, Bld: 95 mg/dL (ref 65–99)
Potassium: 4 mmol/L (ref 3.5–5.1)
Sodium: 134 mmol/L — ABNORMAL LOW (ref 135–145)

## 2018-01-21 LAB — LACTATE DEHYDROGENASE: LDH: 246 U/L — ABNORMAL HIGH (ref 98–192)

## 2018-01-21 LAB — GLUCOSE, CAPILLARY
GLUCOSE-CAPILLARY: 108 mg/dL — AB (ref 65–99)
GLUCOSE-CAPILLARY: 117 mg/dL — AB (ref 65–99)
Glucose-Capillary: 98 mg/dL (ref 65–99)
Glucose-Capillary: 144 mg/dL — ABNORMAL HIGH (ref 65–99)

## 2018-01-21 MED ORDER — INSULIN ASPART 100 UNIT/ML ~~LOC~~ SOLN
0.0000 [IU] | Freq: Three times a day (TID) | SUBCUTANEOUS | Status: DC
  Administered 2018-01-21: 2 [IU] via SUBCUTANEOUS
  Administered 2018-01-22: 5 [IU] via SUBCUTANEOUS

## 2018-01-21 MED ORDER — WARFARIN SODIUM 2.5 MG PO TABS
2.5000 mg | ORAL_TABLET | Freq: Once | ORAL | Status: AC
  Administered 2018-01-21: 2.5 mg via ORAL
  Filled 2018-01-21: qty 1

## 2018-01-21 MED ORDER — INSULIN ASPART 100 UNIT/ML ~~LOC~~ SOLN: 0.0000 [IU] | Freq: Every day | SUBCUTANEOUS | Status: DC

## 2018-01-21 NOTE — Progress Notes (Signed)
Arrived to patient room 2C-09 at 1905.  Reviewed treatment plan and this RN agrees with plan.  Report received from bedside RN, Hayley.  Consent obtained.  Patient A & O X 4.   Lung sounds diminished to ausculation in all fields. No edema. Cardiac:  NSR, LVAD.  Removed caps and cleansed RIJ catheter with chlorhedxidine.  Aspirated ports of heparin and flushed them with saline per protocol.  Connected and secured lines, initiated treatment at 1925.  UF Goal of 500 mL and net fluid removal 0 L.  Will continue to monitor.

## 2018-01-21 NOTE — Progress Notes (Signed)
Pt had frequent PI events from 14:21 to 21:03 according to the event log. Md was made aware. Pt states he is feeling much improved from the previous day. Pt is sitting in bed no sign or symptoms of distress. HR is SR with occasional PVC's in the 80's Doppler pressers are mid  70's. Pt is able to eat and drink with out any nausea or vomiting. Will continue to monitor pt.

## 2018-01-21 NOTE — Progress Notes (Signed)
[name] Friday was given to msg to tell [place] that the pt's HD would be done on 01/21/2018. He was asked to relay the msg the pt's RN who was on the phone with a MD.

## 2018-01-21 NOTE — Progress Notes (Signed)
[place] for warfarin Indication: history of DVT, LVAD  Allergies  Allergen Reactions  . Metformin And Related Diarrhea    Patient Measurements: Height: 5' 8"  (172.7 cm) Weight: 171 lb 1.2 oz (77.6 kg) IBW/kg (Calculated) : 68.4   Vital Signs: Temp: 97.5 F (36.4 C) (05/19 0735) Temp Source: Oral (05/19 0735) BP: 113/75 (05/19 0735) Pulse Rate: 77 (05/19 0735)  Labs: Recent Labs    01/19/18 1746 01/19/18 1748 01/20/18 0618 01/21/18 0234  HGB 13.3  --  13.2 12.9*  HCT 42.3  --  41.5 40.1  PLT 282  --  269 241  LABPROT  --  27.1* 29.1* 35.1*  INR  --  2.54 2.78 3.53  CREATININE 5.29*  --  6.65* 8.76*    Estimated Creatinine Clearance: 9.4 mL/min (A) (by C-G formula based on SCr of 8.76 mg/dL (H)).    Assessment: 52 year old male with LVAD admitted for intractable nausea and vomiting.  Also with factor 5 leiden. Pharmacy consulted to dose coumadin (patient noted on erythromycin) -INR= 3.53- expect jump due to drug interaction with erthyromycin.  Also eating better today.  Pta warfarin dose 63m daily except 7.570mTuesday and Thursday.    Goal of Therapy:  INR 2-3 Monitor platelets by anticoagulation protocol: Yes   Plan:  -Coumadin 2.5 mg po today -Daily PT/INR  JeMarguerite OleaBCTroy Community Hospitallinical Pharmacist Pager ([phone]/19/2019 11:20 AM

## 2018-01-21 NOTE — Progress Notes (Signed)
Patient ID: [name], male   DOB: 1966-07-29, 52 y.o.   MRN: [id number]   Advanced Heart Failure VAD Team Note  PCP-Cardiologist: No primary care provider on file.   Subjective:    No further nausea/vomiting.  Eating again.  Did not get HD yesterday, awaiting it this morning.   He had multiple PI events yesterday evening/overnight.  These have subsided since 5 am.  LDH stable at 246.   INR 3.5 today.    LVAD INTERROGATION:  HeartMate 3 LVAD:   Flow 4.1 liters/min, speed 5500, power 4.1, PI 4.5.  40 PI events/24 hrs.    Objective:    Vital Signs:   Temp:  [97.4 F (36.3 C)-98.4 F (36.9 C)] 97.5 F (36.4 C) (05/19 0735) Pulse Rate:  [30-92] 77 (05/19 0735) Resp:  [10-21] 18 (05/19 0735) BP: (74-113)/(44-75) 113/75 (05/19 0735) SpO2:  [97 %-100 %] 97 % (05/19 0735) Weight:  [171 lb 1.2 oz (77.6 kg)] 171 lb 1.2 oz (77.6 kg) (05/19 0428) Last BM Date: 01/20/18 Mean arterial Pressure 70s-80s  Intake/Output:   Intake/Output Summary (Last 24 hours) at 01/21/2018 0820 Last data filed at 01/21/2018 0600 Gross per 24 hour  Intake 1090 ml  Output -  Net 1090 ml     Physical Exam    GENERAL: Well appearing this am. NAD.  HEENT: Normal. NECK: Supple, JVP 7-8 cm. Carotids OK.  CARDIAC:  Mechanical heart sounds with LVAD hum present.  LUNGS:  CTAB, normal effort.  ABDOMEN:  NT, ND, no HSM. No bruits or masses. +BS  LVAD exit site: Well-healed and incorporated. Dressing dry and intact. No erythema or drainage. Stabilization device present and accurately applied. Driveline dressing changed daily per sterile technique. EXTREMITIES:  Warm and dry. No cyanosis, clubbing, rash, or edema.  NEUROLOGIC:  Alert & oriented x 3. Cranial nerves grossly intact. Moves all 4 extremities w/o difficulty. Affect pleasant    Telemetry   NSR 90s (personally reviewed)   Labs   Basic Metabolic Panel: Recent Labs  Lab 01/19/18 1746 01/20/18 0618 01/21/18 0234  NA 136 138 134*  K  4.2 4.5 4.0  CL 95* 98* 95*  CO2 25 23 24   GLUCOSE 119* 102* 95  BUN 30* 43* 60*  CREATININE 5.29* 6.65* 8.76*  CALCIUM 8.8* 8.6* 8.0*    Liver Function Tests: Recent Labs  Lab 01/19/18 1746  AST 19  ALT 17  ALKPHOS 116  BILITOT 0.8  PROT 7.8  ALBUMIN 3.7   Recent Labs  Lab 01/19/18 1746  LIPASE 19   No results for input(s): AMMONIA in the last 168 hours.  CBC: Recent Labs  Lab 01/19/18 1746 01/20/18 0618 01/21/18 0234  WBC 11.1* 10.3 8.0  NEUTROABS 9.5*  --   --   HGB 13.3 13.2 12.9*  HCT 42.3 41.5 40.1  MCV 91.4 90.6 89.3  PLT 282 269 241    INR: Recent Labs  Lab 01/17/18 01/19/18 1748 01/20/18 0618 01/21/18 0234  INR 3.0 2.54 2.78 3.53    Other results:  EKG:    Imaging   Dg Chest Port 1 View  Result Date: 01/19/2018 CLINICAL DATA:  Intractable nausea and vomiting. EXAM: PORTABLE CHEST 1 VIEW COMPARISON:  12/11/2017 FINDINGS: There are changes from prior cardiac surgery with placement of a left ventricular assist device. The position of the assist device is stable from prior exam. There is a stable left anterior chest wall AICD. Right internal jugular tunneled dual lumen central venous catheter is stable,  tip projecting in the right atrium. Cardiac silhouette is normal in size. Mediastinal or hilar masses. No convincing adenopathy. Clear lungs.  No pleural effusion or pneumothorax. Skeletal structures are grossly intact. IMPRESSION: No acute cardiopulmonary disease. Electronically Signed   By: Lajean Manes M.D.   On: 01/19/2018 16:21     Medications:     Scheduled Medications: . amLODipine  2.5 mg Oral Daily  . aspirin EC  81 mg Oral Daily  . atorvastatin  40 mg Oral q1800  . docusate sodium  200 mg Oral Daily  . dronabinol  2.5 mg Oral BID AC  . erythromycin  250 mg Oral TID WC  . haloperidol lactate  2.5 mg Intravenous Once  . hydrALAZINE  37.5 mg Oral Q8H  . insulin aspart  0-15 Units Subcutaneous TID WC  . insulin aspart  0-5 Units  Subcutaneous QHS  . insulin aspart  3 Units Subcutaneous TID WC  . insulin glargine  5 Units Subcutaneous Daily  . metoCLOPramide  10 mg Oral TID  . pantoprazole  40 mg Oral Daily  . sildenafil  20 mg Oral TID  . sodium chloride flush  10-40 mL Intracatheter Q12H  . Warfarin - Pharmacist Dosing Inpatient   Does not apply q1800    Infusions:   PRN Medications: acetaminophen, hydrALAZINE, LORazepam, ondansetron (ZOFRAN) IV, promethazine, sodium chloride flush    Assessment/Plan:    1. Intractable nausea/vomiting secondary to gastroparesis: Recurrent symptoms.  He is now better with Ativan seeming to help the most.  He was started on a course of erythromycin again as well.  Now eating his regular diet, tolerating.   - Continue reglan, phenergan, and ativan as needed.  - Restart ederythromycin 250 mg three times a day for gastroparesis. Would continue for 2 week course.  - He has outpatient follow up next week with Dr Derrill Kay on 01/24/18 [facility]).  2. ESRD: Did not get HD yesterday, awaiting HD today.  3. Chronic Systolic Heart Failure, HMIII 2018: He has been seen at [facility], to be worked up for heart/kidney transplant.  INR 3.5 today.  He remains on ASA 81 + warfarin with stable LDH.  Multiple PI events overnight.  I am not sure what has triggered this unless some degree of volume depletion with poor po intake though should not be so prominent as he has missed HD. MAP has been stable.  PI events now seem to have subsided.  - Volume removal via HD.  - Continue hydralazine and amlodipine on non-HD days.  4. DM,insulin dependent - Continue home insulin. Add sliding scale.  5. HTN: MAP 80s today, should be able to take his po meds.    With multiple PI events overnight and missing HD yesterday (needs today), will observe today.  If stable tomorrow, can discharge.  Will have to discuss with nephrology whether or not he will need HD again tomorrow.   I reviewed the LVAD  parameters from today, and compared the results to the patient's prior recorded data.  No programming changes were made.  The LVAD is functioning within specified parameters.  The patient performs LVAD self-test daily.  LVAD interrogation was negative for any significant power changes, alarms or PI events/speed drops.  LVAD equipment check completed and is in good working order.  Back-up equipment present.   LVAD education done on emergency procedures and precautions and reviewed exit site care.  Length of Stay: 2  Loralie Champagne, MD 01/21/2018, 8:20 AM  VAD Team ---  VAD ISSUES ONLY--- Pager [phone] (7am - 7am)  Advanced Heart Failure Team  Pager [phone] (M-F; [place])  Please contact [facility] for night-coverage after hours (4p -7a ) and weekends on [email or web address]

## 2018-01-21 NOTE — Progress Notes (Signed)
[facility] Progress Note  Subjective: nausea better   Vitals:   01/21/18 0428 01/21/18 0452 01/21/18 0735 01/21/18 1146  BP:  97/71 113/75 92/67  Pulse: 70  77   Resp: 14  18 13   Temp: (!) 97.4 F (36.3 C)  (!) 97.5 F (36.4 C) 97.6 F (36.4 C)  TempSrc: Oral  Oral Oral  SpO2: 100%  97%   Weight: 77.6 kg (171 lb 1.2 oz)     Height:        Inpatient medications: . amLODipine  2.5 mg Oral Daily  . aspirin EC  81 mg Oral Daily  . atorvastatin  40 mg Oral q1800  . docusate sodium  200 mg Oral Daily  . dronabinol  2.5 mg Oral BID AC  . erythromycin  250 mg Oral TID WC  . haloperidol lactate  2.5 mg Intravenous Once  . hydrALAZINE  37.5 mg Oral Q8H  . insulin aspart  0-15 Units Subcutaneous TID WC  . insulin aspart  0-5 Units Subcutaneous QHS  . insulin aspart  3 Units Subcutaneous TID WC  . insulin glargine  5 Units Subcutaneous Daily  . metoCLOPramide  10 mg Oral TID  . pantoprazole  40 mg Oral Daily  . sildenafil  20 mg Oral TID  . sodium chloride flush  10-40 mL Intracatheter Q12H  . warfarin  2.5 mg Oral ONCE-1800  . Warfarin - Pharmacist Dosing Inpatient   Does not apply q1800    acetaminophen, hydrALAZINE, LORazepam, ondansetron (ZOFRAN) IV, promethazine, sodium chloride flush  Exam: General:NAD, chronically ill appearing male, laying in bed Heart:regular rate, mechanical heart sounds w/LVAD hum Lungs:CTAB anterior/lateraly Abdomen:soft, NTND Extremities:no LE edema Dialysis Access: R IJ TDC, RUA AVG +bruit    Outpatient HD: MWF [facility] 4h 39mn 400/800 3K/ 2Ca R IJ TDC/RUAAVG ([place]graft, 10/31/17, Dr CBridgett Larsson Hep 8000 EDW is 80kg including 3.2kg LVAD battery not used while in hospital = edw of 76.8kg in hospital   Assessment: 1.Nausea and vomiting - improved 2. ESRD - plan hd this afternoon and regular hd Monday to get back on sched 3. Anemia of CKD-hb 13.1 no need for esa 4. Secondary hyperparathyroidism -Continue VDRA 5. HTN  - goal map 70-90 per cardiology is on hydral/ norvasc 6. Volume - 1kg up by wts (adjusted edw in hosp =76.8kg) 7. Ischemic CM - s/o LDAV & BiV-ICD: per primary 8. Chronic anticoagulation w/LVAD device - coumadin 9. Limited code - no compressions, everything else yes 10. Access- AVG right arm is open but not using until sees dr cBridgett Larssonin office per pt  Plan - plan as above    RKelly SplinterMD CBay Headpager [phone]  01/21/2018, 1:45 PM   Recent Labs  Lab 01/19/18 1746 01/20/18 0618 01/21/18 0234  NA 136 138 134*  K 4.2 4.5 4.0  CL 95* 98* 95*  CO2 25 23 24   GLUCOSE 119* 102* 95  BUN 30* 43* 60*  CREATININE 5.29* 6.65* 8.76*  CALCIUM 8.8* 8.6* 8.0*   Recent Labs  Lab 01/19/18 1746  AST 19  ALT 17  ALKPHOS 116  BILITOT 0.8  PROT 7.8  ALBUMIN 3.7   Recent Labs  Lab 01/19/18 1746 01/20/18 0618 01/21/18 0234  WBC 11.1* 10.3 8.0  NEUTROABS 9.5*  --   --   HGB 13.3 13.2 12.9*  HCT 42.3 41.5 40.1  MCV 91.4 90.6 89.3  PLT 282 269 241   Iron/TIBC/Ferritin/ %Sat    Component Value Date/Time  IRON 40 (L) 11/02/2017 0016   TIBC 188 (L) 11/02/2017 0016   FERRITIN 523 (H) 11/02/2017 0016   IRONPCTSAT 21 11/02/2017 0016

## 2018-01-21 NOTE — Progress Notes (Signed)
4 more PI events 5/18 Times are 23:54:28, 23:24:30, 5/19 Times are 00:24:36, 00:56. Pt is resting comfortable no signs or symptoms of distress. Will continue to monitor

## 2018-01-21 NOTE — Progress Notes (Signed)
Dialysis treatment completed.  500 mL ultrafiltrated.  0 mL net fluid removal.  Patient status unchanged. Lung sounds diminished to ausculation in all fields. No edema. Cardiac: NSR.  Cleansed RIJ catheter with chlorhexidine.  Disconnected lines and flushed ports with saline per protocol.  Ports locked with heparin and capped per protocol.    Report given to bedside, RN Amina.

## 2018-01-22 LAB — BASIC METABOLIC PANEL
ANION GAP: 12 (ref 5–15)
BUN: 38 mg/dL — ABNORMAL HIGH (ref 6–20)
CHLORIDE: 100 mmol/L — AB (ref 101–111)
CO2: 26 mmol/L (ref 22–32)
CREATININE: 6.8 mg/dL — AB (ref 0.61–1.24)
Calcium: 7.8 mg/dL — ABNORMAL LOW (ref 8.9–10.3)
GFR calc Af Amer: 10 mL/min — ABNORMAL LOW (ref >60)
GFR calc non Af Amer: 8 mL/min — ABNORMAL LOW (ref >60)
Glucose, Bld: 227 mg/dL — ABNORMAL HIGH (ref 65–99)
Potassium: 3.6 mmol/L (ref 3.5–5.1)
SODIUM: 138 mmol/L (ref 135–145)

## 2018-01-22 LAB — CBC
HEMATOCRIT: 37.8 % — AB (ref 39.0–52.0)
HEMOGLOBIN: 12.2 g/dL — AB (ref 13.0–17.0)
MCH: 28.7 pg (ref 26.0–34.0)
MCHC: 32.3 g/dL (ref 30.0–36.0)
MCV: 88.9 fL (ref 78.0–100.0)
Platelets: 235 10*3/uL (ref 150–400)
RBC: 4.25 MIL/uL (ref 4.22–5.81)
RDW: 17.3 % — ABNORMAL HIGH (ref 11.5–15.5)
WBC: 9.7 10*3/uL (ref 4.0–10.5)

## 2018-01-22 LAB — GLUCOSE, CAPILLARY
GLUCOSE-CAPILLARY: 246 mg/dL — AB (ref 65–99)
GLUCOSE-CAPILLARY: 109 mg/dL — AB (ref 65–99)
Glucose-Capillary: 109 mg/dL — ABNORMAL HIGH (ref 65–99)

## 2018-01-22 LAB — MRSA PCR SCREENING: MRSA by PCR: NEGATIVE

## 2018-01-22 LAB — PROTIME-INR
INR: 2.97
PROTHROMBIN TIME: 30.7 s — AB (ref 11.4–15.2)

## 2018-01-22 LAB — LACTATE DEHYDROGENASE: LDH: 243 U/L — ABNORMAL HIGH (ref 98–192)

## 2018-01-22 MED ORDER — WARFARIN SODIUM 5 MG PO TABS
5.0000 mg | ORAL_TABLET | ORAL | Status: AC
  Administered 2018-01-22: 5 mg via ORAL
  Filled 2018-01-22: qty 1

## 2018-01-22 MED ORDER — LIDOCAINE-PRILOCAINE 2.5-2.5 % EX CREA: 1.0000 "application " | TOPICAL_CREAM | CUTANEOUS | Status: DC | PRN

## 2018-01-22 MED ORDER — PENTAFLUOROPROP-TETRAFLUOROETH EX AERO: 1.0000 "application " | INHALATION_SPRAY | CUTANEOUS | Status: DC | PRN

## 2018-01-22 MED ORDER — METOCLOPRAMIDE HCL 10 MG PO TABS: 10.0000 mg | ORAL_TABLET | Freq: Three times a day (TID) | ORAL | 6 refills | Status: DC

## 2018-01-22 MED ORDER — INSULIN GLARGINE 100 UNIT/ML ~~LOC~~ SOLN
10.0000 [IU] | Freq: Every day | SUBCUTANEOUS | Status: DC
Start: 2018-01-22 — End: 2018-05-08

## 2018-01-22 MED ORDER — WARFARIN SODIUM 5 MG PO TABS: 5.0000 mg | ORAL_TABLET | Freq: Every day | ORAL | 6 refills | Status: DC

## 2018-01-22 MED ORDER — HEPARIN SODIUM (PORCINE) 1000 UNIT/ML DIALYSIS: 6000.0000 [IU] | Freq: Once | INTRAMUSCULAR | Status: DC

## 2018-01-22 MED ORDER — SODIUM CHLORIDE 0.9 % IV SOLN: 100.0000 mL | INTRAVENOUS | Status: DC | PRN

## 2018-01-22 MED ORDER — ERYTHROMYCIN BASE 250 MG PO TABS: 250.0000 mg | ORAL_TABLET | Freq: Three times a day (TID) | ORAL | 0 refills | Status: AC

## 2018-01-22 MED ORDER — INSULIN ASPART 100 UNIT/ML ~~LOC~~ SOLN: 5.0000 [IU] | Freq: Three times a day (TID) | SUBCUTANEOUS | 0 refills | Status: DC

## 2018-01-22 MED ORDER — HEPARIN SODIUM (PORCINE) 1000 UNIT/ML DIALYSIS: 2000.0000 [IU] | INTRAMUSCULAR | Status: DC | PRN

## 2018-01-22 NOTE — Progress Notes (Signed)
[facility] NEPHROLOGY PROGRESS NOTE  Assessment/ Plan: Pt is a 52 y.o. yo male with  ESRD 2/2 on HD MWF at [facility], ischemic cardiomyopathy with LVAD, CAD, DMT2 with gastroparesis, and GERD admitted with nausea vomiting which is improved now.  Assessment/Plan:  # ESRD: Had dialysis yesterday with no net fluid removal.  Plan for dialysis today.  Potassium 3.6. Dialysis orders: 3K, ca 2.25, 400/800, 1 kg UF as tolerated, RIJ cath  # Anemia: Hb 12.2 stable, at goal  # Secondary hyperparathyroidism: ca 7.8. Not on binders.   # HTN/volume: On amlodipine and hydralazine.  Monitor blood pressure.  #Ischemic cardiomyopathy, has LVAD, per cardiology  #Dialysis access: Follow-up with Dr. Bridgett Larsson for right AV graft.  Patient has tunneled catheter for dialysis.  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   Subjective:   Objective Vital signs in last 24 hours: Vitals:   01/21/18 2326 01/22/18 0434 01/22/18 0500 01/22/18 0748  BP: 92/77 91/63  107/86  Pulse:  (!) 103  93  Resp: (!) 23 17  (!) 23  Temp: 99 F (37.2 C) 99 F (37.2 C)  98.9 F (37.2 C)  TempSrc: Oral Oral  Oral  SpO2: 97% 98%  94%  Weight:   78.4 kg (172 lb 13.5 oz)   Height:       Weight change: 1.7 kg (3 lb 12 oz)  Intake/Output Summary (Last 24 hours) at 01/22/2018 1002 Last data filed at 01/21/2018 2158 Gross per 24 hour  Intake -  Output 0 ml  Net 0 ml       Labs: Basic Metabolic Panel: Recent Labs  Lab 01/20/18 0618 01/21/18 0234 01/22/18 0323  NA 138 134* 138  K 4.5 4.0 3.6  CL 98* 95* 100*  CO2 23 24 26   GLUCOSE 102* 95 227*  BUN 43* 60* 38*  CREATININE 6.65* 8.76* 6.80*  CALCIUM 8.6* 8.0* 7.8*   Liver Function Tests: Recent Labs  Lab 01/19/18 1746  AST 19  ALT 17  ALKPHOS 116  BILITOT 0.8  PROT 7.8  ALBUMIN 3.7   Recent Labs  Lab 01/19/18 1746  LIPASE 19   No results for input(s): AMMONIA in the last 168 hours. CBC: Recent Labs  Lab 01/19/18 1746  01/20/18 0618 01/21/18 0234 01/22/18 0323  WBC 11.1* 10.3 8.0 9.7  NEUTROABS 9.5*  --   --   --   HGB 13.3 13.2 12.9* 12.2*  HCT 42.3 41.5 40.1 37.8*  MCV 91.4 90.6 89.3 88.9  PLT 282 269 241 235   Cardiac Enzymes: No results for input(s): CKTOTAL, CKMB, CKMBINDEX, TROPONINI in the last 168 hours. CBG: Recent Labs  Lab 01/21/18 0736 01/21/18 1237 01/21/18 1655 01/21/18 2108 01/22/18 0819  GLUCAP 98 144* 108* 117* 246*    Iron Studies: No results for input(s): IRON, TIBC, TRANSFERRIN, FERRITIN in the last 72 hours. Studies/Results: No results found.  Medications: Infusions:   Scheduled Medications: . amLODipine  2.5 mg Oral Daily  . aspirin EC  81 mg Oral Daily  . atorvastatin  40 mg Oral q1800  . docusate sodium  200 mg Oral Daily  . dronabinol  2.5 mg Oral BID AC  . erythromycin  250 mg Oral TID WC  . haloperidol lactate  2.5 mg Intravenous Once  . hydrALAZINE  37.5 mg Oral Q8H  . insulin aspart  0-15 Units Subcutaneous TID WC  . insulin aspart  0-5 Units Subcutaneous QHS  . insulin aspart  3 Units  Subcutaneous TID WC  . insulin glargine  5 Units Subcutaneous Daily  . metoCLOPramide  10 mg Oral TID  . pantoprazole  40 mg Oral Daily  . sildenafil  20 mg Oral TID  . sodium chloride flush  10-40 mL Intracatheter Q12H  . Warfarin - Pharmacist Dosing Inpatient   Does not apply q1800    have reviewed scheduled and prn medications.  Physical Exam: General:NAD, comfortable Heart: Irregular heart rate, s1s2 nl Lungs:clear b/l, no cracjle Abdomen:soft, Non-tender, non-distended Extremities:No edema Dialysis Access: Right IJ tunneled catheter.  Baudelia Schroepfer Prasad Maejor Erven 01/22/2018,10:02 AM  LOS: 3 days

## 2018-01-22 NOTE — Progress Notes (Signed)
LVAD Coordinator Rounding Note:  Admitted 01/19/18 from the ED for intractable N/V.   HM III LVAD implanted on 06/12/17 by Dr. Prescott Gum under Destination Therapy criteria due to renal insufficiency.  Vital signs: Temp: 98.5 HR:  100 Doppler Pressure:  84  Automatic BP:  91/63 (71) O2 Sat: 97% on RA Wt: 167>171>172 lbs  LVAD interrogation reveals:  Speed: 5500 Flow: 4.0 Power:  4.0w PI: 3.4 Alarms: none  Events:  >120 PI events on 01/21/18 Hematocrit: 38 Fixed speed: 5500 Low speed limit: 5200  Drive Line: left abdominal exit site sorbaview dressing dry and intact; anchor in place and accurately applied. Weekly dressing changes; next dressing change is due 01/23/18.  Labs:  LDH trend: 258>223>246>243  INR trend: 2.54>2.78>3.53>2.97  Anticoagulation Plan: -INR Goal: 2.0 - 3.0 -ASA Dose: 81 mg daily  Device: -St Jude dual  -Therapies: on at 200 bpm - Monitor: on VT 150 bpm  Adverse Events on VAD: - ESRD post LVAD; started CVVH 06/15/17; HD started 06/20/17 - Hospitalization 11/5 - 07/16/17 > gastroparesis diagnosis after EGD - Hospitalization 11/26 - 08/04/17 > gastroparesis - Hospitalization 1/8 - 09/16/17>gastroparesis - referred to Dr. Corliss Parish at [facility] - 10/31/17>rightupper arm arteriovenous graft with Artegraft; ligation of right first stage brachial vein transposition  - 12/11/13 elective admission for thrombectomy for AV graft in RUA  - 12/20/17 > intractable N/V - 01/19/18 > intractable N/V sent by EMS from dialysis center   Plan/Recommendations:   1. Discharge home today - will see in VAD clinic next week.  2. Please call VAD coordinator for any equipment issues or concerns.  Zada Girt RN, VAD Coordinator 24/7 VAD Pager: [phone]

## 2018-01-22 NOTE — Progress Notes (Signed)
[place] for warfarin Indication: history of DVT, LVAD  Allergies  Allergen Reactions  . Metformin And Related Diarrhea    Patient Measurements: Height: 5' 8"  (172.7 cm) Weight: 172 lb 13.5 oz (78.4 kg)(Scale A) IBW/kg (Calculated) : 68.4   Vital Signs: Temp: 98.9 F (37.2 C) (05/20 0748) Temp Source: Oral (05/20 0748) BP: 107/86 (05/20 0748) Pulse Rate: 93 (05/20 0748)  Labs: Recent Labs    01/20/18 0618 01/21/18 0234 01/22/18 0323  HGB 13.2 12.9* 12.2*  HCT 41.5 40.1 37.8*  PLT 269 241 235  LABPROT 29.1* 35.1* 30.7*  INR 2.78 3.53 2.97  CREATININE 6.65* 8.76* 6.80*    Estimated Creatinine Clearance: 12.2 mL/min (A) (by C-G formula based on SCr of 6.8 mg/dL (H)).    Assessment: 52 year old male with LVAD admitted for intractable nausea and vomiting.  Also with factor 5 leiden. Pharmacy consulted to dose coumadin. Of note pt is now on erythromycin for at least 2 weeks, N/V has resolved, INR at goal at 2.97. Would discharge on warfarin 18m daily with borderline INR at last outpatient INR check and new erythromycin.  Pta warfarin dose 5794mdaily except 7.94m49muesday and Thursday.    Goal of Therapy:  INR 2-3 Monitor platelets by anticoagulation protocol: Yes   Plan:  -Coumadin 94mg17mily at discharge -INR follow-up scheduled for Wednesday  MichArrie SenatearmD, BCPSFarmington-2 Cardiology Pharmacy Resident Pager: [phone]

## 2018-01-22 NOTE — Progress Notes (Addendum)
Patient ID: [name], male   DOB: 1965-10-31, 51 y.o.   MRN: [id number]   Advanced Heart Failure VAD Team Note  PCP-Cardiologist: No primary care provider on file.   Subjective:    No more nausea/vomiting. Had HD last night with no fluid removal. Plans for HD again today to get back on schedule.   INR 2.97. LDH stable 243  LVAD INTERROGATION:  HeartMate 3 LVAD:   Flow 4.2 liters/min, speed 5500, power 4, PI 3.  1 PI event since midnight.   Objective:    Vital Signs:   Temp:  [97.5 F (36.4 C)-99 F (37.2 C)] 99 F (37.2 C) (05/20 0434) Pulse Rate:  [61-103] 103 (05/20 0434) Resp:  [13-23] 17 (05/20 0434) BP: (83-144)/(21-99) 91/63 (05/20 0434) SpO2:  [97 %-98 %] 98 % (05/20 0434) Weight:  [172 lb 13.5 oz (78.4 kg)-174 lb 13.2 oz (79.3 kg)] 172 lb 13.5 oz (78.4 kg) (05/20 0500) Last BM Date: 01/20/18 Mean arterial Pressure 70-80s  Intake/Output:   Intake/Output Summary (Last 24 hours) at 01/22/2018 0729 Last data filed at 01/21/2018 2158 Gross per 24 hour  Intake 300 ml  Output 0 ml  Net 300 ml     Physical Exam    GENERAL: Well appearing this am. NAD.  HEENT: Normal. NECK: Supple, JVP 7-8 cm. Carotids OK.  CARDIAC:  Mechanical heart sounds with LVAD hum present.  LUNGS:  CTAB, normal effort.  ABDOMEN:  NT, ND, no HSM. No bruits or masses. +BS  LVAD exit site: Well-healed and incorporated. Dressing dry and intact. No erythema or drainage. Stabilization device present and accurately applied. Driveline dressing changed daily per sterile technique. EXTREMITIES:  Warm and dry. No cyanosis, clubbing, rash, or edema. RUE AVF. NEUROLOGIC:  Alert & oriented x 3. Cranial nerves grossly intact. Moves all 4 extremities w/o difficulty. Affect pleasant    Telemetry   NSR 90-100s. Personally reviewed.   Labs   Basic Metabolic Panel: Recent Labs  Lab 01/19/18 1746 01/20/18 0618 01/21/18 0234 01/22/18 0323  NA 136 138 134* 138  K 4.2 4.5 4.0 3.6  CL 95* 98*  95* 100*  CO2 25 23 24 26   GLUCOSE 119* 102* 95 227*  BUN 30* 43* 60* 38*  CREATININE 5.29* 6.65* 8.76* 6.80*  CALCIUM 8.8* 8.6* 8.0* 7.8*    Liver Function Tests: Recent Labs  Lab 01/19/18 1746  AST 19  ALT 17  ALKPHOS 116  BILITOT 0.8  PROT 7.8  ALBUMIN 3.7   Recent Labs  Lab 01/19/18 1746  LIPASE 19   No results for input(s): AMMONIA in the last 168 hours.  CBC: Recent Labs  Lab 01/19/18 1746 01/20/18 0618 01/21/18 0234 01/22/18 0323  WBC 11.1* 10.3 8.0 9.7  NEUTROABS 9.5*  --   --   --   HGB 13.3 13.2 12.9* 12.2*  HCT 42.3 41.5 40.1 37.8*  MCV 91.4 90.6 89.3 88.9  PLT 282 269 241 235    INR: Recent Labs  Lab 01/17/18 01/19/18 1748 01/20/18 0618 01/21/18 0234 01/22/18 0323  INR 3.0 2.54 2.78 3.53 2.97    Other results:  EKG:    Imaging   No results found.   Medications:     Scheduled Medications: . amLODipine  2.5 mg Oral Daily  . aspirin EC  81 mg Oral Daily  . atorvastatin  40 mg Oral q1800  . docusate sodium  200 mg Oral Daily  . dronabinol  2.5 mg Oral BID AC  . erythromycin  250 mg Oral TID WC  . haloperidol lactate  2.5 mg Intravenous Once  . hydrALAZINE  37.5 mg Oral Q8H  . insulin aspart  0-15 Units Subcutaneous TID WC  . insulin aspart  0-5 Units Subcutaneous QHS  . insulin aspart  3 Units Subcutaneous TID WC  . insulin glargine  5 Units Subcutaneous Daily  . metoCLOPramide  10 mg Oral TID  . pantoprazole  40 mg Oral Daily  . sildenafil  20 mg Oral TID  . sodium chloride flush  10-40 mL Intracatheter Q12H  . Warfarin - Pharmacist Dosing Inpatient   Does not apply q1800    Infusions:   PRN Medications: acetaminophen, hydrALAZINE, LORazepam, ondansetron (ZOFRAN) IV, promethazine, sodium chloride flush    Assessment/Plan:    1. Intractable nausea/vomiting secondary to gastroparesis: Recurrent symptoms.  He is now better with Ativan seeming to help the most.  He was started on a course of erythromycin again as well.   Now eating his regular diet, tolerating.   - Continue reglan, phenergan, and ativan as needed.  - Continue ederythromycin 250 mg three times a day for gastroparesis. Would continue for 2 week course.  - He has outpatient follow up next week with Dr Derrill Kay on 01/24/18 [facility] Gastroparesis Specialist).  2. ESRD: Had HD last night with no fluid removal. Plans for HD again today to get back on MWF schedule.  3. Chronic Systolic Heart Failure, HMIII 2018: He has been seen at [facility], to be worked up for heart/kidney transplant.  INR 2.97 today.  He remains on ASA 81 + warfarin with stable LDH.  Only 1 PI event since midnight.  MAP has been stable.  PI events now seem to have subsided.  - Volume removal via HD.  - Continue hydralazine and amlodipine on non-HD days.  4. DM,insulin dependent - Continue home insulin. Add sliding scale. No change.  5. HTN: MAP 70-80s today.   I reviewed the LVAD parameters from today, and compared the results to the patient's prior recorded data.  No programming changes were made.  The LVAD is functioning within specified parameters.  The patient performs LVAD self-test daily.  LVAD interrogation was negative for any significant power changes, alarms or PI events/speed drops.  LVAD equipment check completed and is in good working order.  Back-up equipment present.   LVAD education done on emergency procedures and precautions and reviewed exit site care.  Disposition: Anticipate discharge later today after HD. Will arrange follow up in LVAD clinic.   Length of Stay: [place], NP 01/22/2018, 7:29 AM  VAD Team --- VAD ISSUES ONLY--- Pager [phone] (7am - 7am)  Advanced Heart Failure Team  Pager [phone] (M-F; 7a - 4p)  Please contact [facility] for night-coverage after hours (4p -7a ) and weekends on [email or web address]  Patient seen with NP, agree with the above note.  He is doing well today, eating regular diet.  He has no further abdominal symptoms.  He will be able  to go home today after HD.  Needs to followup with gastroparesis specialist at [facility] on Wednesday. He will continue erythromycin for total of 2 wk course.   [name] 01/22/2018 8:27 AM

## 2018-01-22 NOTE — Discharge Summary (Addendum)
Advanced Heart Failure Team  Discharge Summary   Patient ID: [name] MRN: [id number], DOB/AGE: 1966/03/19 52 y.o. Admit date: 01/19/2018 D/C date:     01/22/2018   Primary Discharge Diagnoses:  1. Intractable nausea and vomiting due to gastroparesis - Resolved with erythromycin 250 mg TID. Continue through 02/02/18. Sees GI specialist Dr Derrill Kay at [facility] on 01/24/18.   Secondary Discharge Diagnoses:  1. ESRD - HD MWF 2. Chronic systolic heart failure s/p HM III 2018 - Continue 81 mg ASA + coumadin. INR goal 2-3 - HM III speed: 5500 - He has a POC INR machine 3. DM 4. HTN 5. CAD  Hospital Course: [name] is a 52 year old with a history of CAD s/p anterior MI in 2010 and NSTEMI in 3/18 with DES to pRCA and mLAD and ischemic cardiomyopathy now s/p Heartmate 3 LVAD and ESRD. HMIII VAD placed 06/2017.  AVF placed 10/2017 but unable to use. Temporary HD catheter in place.   GI Events -07/2017 Hospital admit twice in November for N/Vdiabetic gastroparesis. GI consulted. Placed on reglan and erythromycin -09/2017 Hospital admit for N/Vdiabetic gastroparesis.  Given phenergan suppositories and erythromycin -12/2017  Hospital admit for N/Vdiabetic gastroparesis.  -01/2018 Hospital admit for N/V/diabetic gastroparesis. Reglan increased. Given 2 weeks of erythromycin.   VAD Drive Line Events: -10/4399>> poss drive line infection, CT ABD neg, ID consult-doxy -11/09/16>> admit for poss drive line infection, IV abx -03/24/17>> doxy for poss drive line infection -05/04/17>> drive line debridement with wound-vac -06/2017>> drive line +proteus, IV abx, Bactrim x14 days  He [facility] to [facility] on [facility]18 with nausea/vomiting x3 days. He had been unable to take medications x 3 days and also missed one HD appointment. He was started on Erythromycin with resolution of symptoms. Reglan was increased to 10 mg TID. He had HD Sunday night with no fluid removal and will resume HD today to get back on normal MWF  schedule.   1. Intractable nausea/vomiting secondary to gastroparesis: Recurrent symptoms. Resolved with addition of Erythromycin. Now tolerating a regular diet - Continue home phenergan and ativan as needed.  - Continue reglan 10 mg TID prior to meals.   - Continue erythromycin 250 mg three times a day for gastroparesis for 2 week course - through 02/02/2018.  - He has outpatient follow up next week with DrKochon 01/24/18([facility]).   2. ESRD: Had HD 01/21/18 with no fluid removal. He will have HD today prior to discharge to get back on MWF schedule.   3. Chronic Systolic Heart Failure, HMIII 2018: He has been seen at [facility] for heart/kidney transplant workup. He had frequent PI events on 5/18 with no known cause. PI events subsided by day of discharge. Coumadin adjusted by pharmacy (decreased with addition of erythromycin). LHD was stable.  - Continue warfarin + 81 mg ASA. Goal INR 2-2.5 - Volume removal via HD.  - Continue hydralazine and amlodipine on non-HD days.   4. DM, insulin dependent - Continue home regimen  5. HTN: MAPs were stable.  - Continue home regimen  6. Disposition: Home today after HD. He has follow up scheduled in LVAD clinic. He needs to keep his appointment with gastroparesis specialist.   LVAD Interrogation HM III:  Speed: 5500 Flow: 4.2 PI: 2.9 Power: 4 Back-up speed: 5200 1 PI event since midnight.      He will be followed closely in HF/LVAD clinic with appointment as below. He has been instructed to contact VAD clinic on Wednesday with  INR reading.   Discharge Weight: 172 lbs Discharge Vitals: Blood pressure 96/82, pulse 100, temperature 98.5 F (36.9 C), temperature source Oral, resp. rate (!) 21, height 5' 8"  (1.727 m), weight 172 lb 13.5 oz (78.4 kg), SpO2 97 %.  Labs: Lab Results  Component Value Date   WBC 9.7 01/22/2018   HGB 12.2 (L) 01/22/2018   HCT 37.8 (L) 01/22/2018   MCV 88.9 01/22/2018   PLT 235 01/22/2018     Recent Labs  Lab 01/19/18 1746  01/22/18 0323  NA 136   < > 138  K 4.2   < > 3.6  CL 95*   < > 100*  CO2 25   < > 26  BUN 30*   < > 38*  CREATININE 5.29*   < > 6.80*  CALCIUM 8.8*   < > 7.8*  PROT 7.8  --   --   BILITOT 0.8  --   --   ALKPHOS 116  --   --   ALT 17  --   --   AST 19  --   --   GLUCOSE 119*   < > 227*   < > = values in this interval not displayed.   Lab Results  Component Value Date   CHOL 128 05/04/2017   HDL 50 05/04/2017   LDLCALC 54 05/04/2017   TRIG 118 05/04/2017   BNP (last 3 results) Recent Labs    06/18/17 2305 06/26/17 0004 07/03/17 0037  BNP 746.0* 1,409.3* 758.9*    ProBNP (last 3 results) No results for input(s): PROBNP in the last 8760 hours.   Diagnostic Studies/Procedures   No results found.  Discharge Medications   Allergies as of 01/22/2018      Reactions   Metformin And Related Diarrhea      Medication List    TAKE these medications   amLODipine 2.5 MG tablet Commonly known as:  NORVASC Take 1 tablet (2.5 mg total) by mouth daily.   aspirin EC 81 MG tablet Take 1 tablet (81 mg total) by mouth daily.   atorvastatin 40 MG tablet Commonly known as:  LIPITOR Take 1 tablet (40 mg total) daily at 6 PM by mouth.   docusate sodium 100 MG capsule Commonly known as:  COLACE Take 2 capsules (200 mg total) by mouth daily.   dronabinol 2.5 MG capsule Commonly known as:  MARINOL Take 1 capsule (2.5 mg total) by mouth 2 (two) times daily before lunch and supper.   erythromycin 250 MG tablet Commonly known as:  E-MYCIN Take 1 tablet (250 mg total) by mouth 3 (three) times daily with meals for 11 days.   hydrALAZINE 25 MG tablet Commonly known as:  APRESOLINE Take 1.5 tablets (37.5 mg total) by mouth 3 (three) times daily. Take on non-hd days   insulin aspart 100 UNIT/ML injection Commonly known as:  novoLOG Inject 5 Units into the skin 3 (three) times daily with meals.   insulin glargine 100 UNIT/ML  injection Commonly known as:  LANTUS Inject 0.1 mLs (10 Units total) into the skin daily.   LORazepam 0.5 MG tablet Commonly known as:  ATIVAN Take 1 tablet (0.5 mg total) by mouth every 12 (twelve) hours as needed for anxiety or sleep.   metoCLOPramide 10 MG tablet Commonly known as:  REGLAN Take 1 tablet (10 mg total) by mouth 3 (three) times daily. What changed:    medication strength  how much to take  when to take this  ondansetron 4 MG disintegrating tablet Commonly known as:  ZOFRAN ODT Take 1 tablet (4 mg total) by mouth every 6 (six) hours as needed for nausea or vomiting.   pantoprazole 40 MG tablet Commonly known as:  PROTONIX Take 40 mg by mouth daily.   promethazine 12.5 MG tablet Commonly known as:  PHENERGAN Take 1 tablet (12.5 mg total) by mouth every 6 (six) hours as needed for nausea or vomiting. What changed:  how much to take   sildenafil 20 MG tablet Commonly known as:  REVATIO Take 1 tablet (20 mg total) by mouth 3 (three) times daily.   warfarin 5 MG tablet Commonly known as:  COUMADIN Take as directed. If you are unsure how to take this medication, talk to your nurse or doctor. Original instructions:  Take 1 tablet (5 mg total) by mouth daily at 6 PM. What changed:    how much to take  how to take this  when to take this  additional instructions       Disposition   The patient will be discharged in stable condition to home. Discharge Instructions    (HEART FAILURE PATIENTS) Call MD:  Anytime you have any of the following symptoms: 1) 3 pound weight gain in 24 hours or 5 pounds in 1 week 2) shortness of breath, with or without a dry hacking cough 3) swelling in the hands, feet or stomach 4) if you have to sleep on extra pillows at night in order to breathe.   Complete by:  As directed    Diet - low sodium heart healthy   Complete by:  As directed    Heart Failure patients record your daily weight using the same scale at the same  time of day   Complete by:  As directed    INR  Goal: 2 - 3   Complete by:  As directed    Goal:  2 - 3   Increase activity slowly   Complete by:  As directed    Page VAD Coordinator at [phone]  Notify for: any VAD alarms, sustained elevations of power >10 watts, sustained drop in Pulse Index <3   Complete by:  As directed    Notify for:   any VAD alarms sustained elevations of power >10 watts sustained drop in Pulse Index <3     STOP any activity that causes chest pain, shortness of breath, dizziness, sweating, or exessive weakness   Complete by:  As directed    Speed Settings:   Complete by:  As directed    Fixed 5500 RPM Low 5200 RPM     Follow-up Information    [facility] Follow up on 01/30/2018.   Specialty:  Cardiology Why:  11:00 am in LVAD clinic Parking code:[id number] Contact information: [address] [id number] [place] [phone]            Duration of Discharge Encounter: Greater than 35 minutes   Signed, Georgiana Shore, NP  01/22/2018, 1:40 PM

## 2018-01-23 NOTE — Progress Notes (Signed)
Discharge note. Patient educated at bedside. RN educated on medications and when to take them, follow-up appointments, signs and symptoms to watch for, diet and activity recommendations, and heart failure instructions.  Pt discharged in wheelchair with NT.  Basil Dess, RN

## 2018-01-24 ENCOUNTER — Ambulatory Visit (HOSPITAL_COMMUNITY): Payer: Self-pay | Admitting: Pharmacist

## 2018-01-24 DIAGNOSIS — Z5181 Encounter for therapeutic drug level monitoring: Secondary | ICD-10-CM

## 2018-01-24 DIAGNOSIS — Z95811 Presence of heart assist device: Secondary | ICD-10-CM

## 2018-01-25 ENCOUNTER — Other Ambulatory Visit: Payer: Self-pay | Admitting: *Deleted

## 2018-01-25 MED ORDER — ATORVASTATIN CALCIUM 40 MG PO TABS: 40.0000 mg | ORAL_TABLET | Freq: Every day | ORAL | 6 refills | Status: DC

## 2018-01-26 ENCOUNTER — Telehealth: Payer: Self-pay | Admitting: Cardiology

## 2018-01-26 ENCOUNTER — Other Ambulatory Visit: Payer: Self-pay | Admitting: Unknown Physician Specialty

## 2018-01-26 MED ORDER — DRONABINOL 2.5 MG PO CAPS: 2.5000 mg | ORAL_CAPSULE | Freq: Two times a day (BID) | ORAL | 1 refills | Status: DC

## 2018-01-26 NOTE — Telephone Encounter (Signed)
This is a CHF pt. [name] Perch, NP, along with Dr. Aundra Dubin, prescribed this medication Dronabinol ( Marinol) for the pt in the hosp, [place] [facility], Dr. Aundra Dubin would have to decide if he would like to refill this medication or refer the pt to PCP. Please address

## 2018-01-26 NOTE — Telephone Encounter (Signed)
This is an LVAD patient I will forward to the LVAD coordinators.

## 2018-01-30 ENCOUNTER — Other Ambulatory Visit (HOSPITAL_COMMUNITY): Payer: Self-pay | Admitting: *Deleted

## 2018-01-30 ENCOUNTER — Ambulatory Visit (HOSPITAL_COMMUNITY)
Admit: 2018-01-30 | Discharge: 2018-01-30 | Disposition: A | Discharge status: Home / Self Care | Payer: BLUE CROSS/BLUE SHIELD | Source: Ambulatory Visit | Attending: Cardiology | Admitting: Cardiology

## 2018-01-30 ENCOUNTER — Other Ambulatory Visit (HOSPITAL_COMMUNITY): Payer: Self-pay

## 2018-01-30 ENCOUNTER — Encounter (HOSPITAL_COMMUNITY): Payer: Self-pay

## 2018-01-30 VITALS — BP 104/76 | HR 98 | Ht 68.0 in | Wt 177.2 lb

## 2018-01-30 DIAGNOSIS — Z794 Long term (current) use of insulin: Secondary | ICD-10-CM | POA: Insufficient documentation

## 2018-01-30 DIAGNOSIS — Z7982 Long term (current) use of aspirin: Secondary | ICD-10-CM | POA: Diagnosis not present

## 2018-01-30 DIAGNOSIS — E1143 Type 2 diabetes mellitus with diabetic autonomic (poly)neuropathy: Secondary | ICD-10-CM | POA: Insufficient documentation

## 2018-01-30 DIAGNOSIS — D6851 Activated protein C resistance: Secondary | ICD-10-CM | POA: Diagnosis not present

## 2018-01-30 DIAGNOSIS — Z992 Dependence on renal dialysis: Secondary | ICD-10-CM | POA: Diagnosis not present

## 2018-01-30 DIAGNOSIS — E1122 Type 2 diabetes mellitus with diabetic chronic kidney disease: Secondary | ICD-10-CM | POA: Insufficient documentation

## 2018-01-30 DIAGNOSIS — I5022 Chronic systolic (congestive) heart failure: Secondary | ICD-10-CM

## 2018-01-30 DIAGNOSIS — K219 Gastro-esophageal reflux disease without esophagitis: Secondary | ICD-10-CM | POA: Insufficient documentation

## 2018-01-30 DIAGNOSIS — I252 Old myocardial infarction: Secondary | ICD-10-CM | POA: Diagnosis not present

## 2018-01-30 DIAGNOSIS — I2510 Atherosclerotic heart disease of native coronary artery without angina pectoris: Secondary | ICD-10-CM | POA: Insufficient documentation

## 2018-01-30 DIAGNOSIS — K5090 Crohn's disease, unspecified, without complications: Secondary | ICD-10-CM | POA: Diagnosis not present

## 2018-01-30 DIAGNOSIS — I5043 Acute on chronic combined systolic (congestive) and diastolic (congestive) heart failure: Secondary | ICD-10-CM

## 2018-01-30 DIAGNOSIS — N186 End stage renal disease: Secondary | ICD-10-CM | POA: Diagnosis not present

## 2018-01-30 DIAGNOSIS — K3184 Gastroparesis: Secondary | ICD-10-CM | POA: Insufficient documentation

## 2018-01-30 DIAGNOSIS — Z95811 Presence of heart assist device: Secondary | ICD-10-CM

## 2018-01-30 DIAGNOSIS — E1151 Type 2 diabetes mellitus with diabetic peripheral angiopathy without gangrene: Secondary | ICD-10-CM | POA: Insufficient documentation

## 2018-01-30 DIAGNOSIS — I10 Essential (primary) hypertension: Secondary | ICD-10-CM | POA: Diagnosis not present

## 2018-01-30 DIAGNOSIS — Z7901 Long term (current) use of anticoagulants: Secondary | ICD-10-CM

## 2018-01-30 DIAGNOSIS — Z86718 Personal history of other venous thrombosis and embolism: Secondary | ICD-10-CM | POA: Diagnosis not present

## 2018-01-30 DIAGNOSIS — I255 Ischemic cardiomyopathy: Secondary | ICD-10-CM | POA: Insufficient documentation

## 2018-01-30 DIAGNOSIS — I132 Hypertensive heart and chronic kidney disease with heart failure and with stage 5 chronic kidney disease, or end stage renal disease: Secondary | ICD-10-CM | POA: Insufficient documentation

## 2018-01-30 DIAGNOSIS — Z955 Presence of coronary angioplasty implant and graft: Secondary | ICD-10-CM | POA: Insufficient documentation

## 2018-01-30 DIAGNOSIS — Z79899 Other long term (current) drug therapy: Secondary | ICD-10-CM | POA: Diagnosis not present

## 2018-01-30 DIAGNOSIS — E785 Hyperlipidemia, unspecified: Secondary | ICD-10-CM | POA: Insufficient documentation

## 2018-01-30 DIAGNOSIS — I4892 Unspecified atrial flutter: Secondary | ICD-10-CM | POA: Insufficient documentation

## 2018-01-30 DIAGNOSIS — Z9049 Acquired absence of other specified parts of digestive tract: Secondary | ICD-10-CM | POA: Insufficient documentation

## 2018-01-30 MED ORDER — DRONABINOL 2.5 MG PO CAPS: 2.5000 mg | ORAL_CAPSULE | Freq: Two times a day (BID) | ORAL | 5 refills | Status: DC

## 2018-01-30 NOTE — Patient Instructions (Signed)
You are scheduled for a Right Heart Cardiac Catheterization on June 6th at 0730 with Dr. Aundra Dubin as part of your ongoing heart transplant evaluation.   Please check in with admissions (2nd floor entrance with valet parking) of [facility] by 0530 on the day of your procedure to have lab work done.   1. Nothing to eat or drink after midnight except your medications with a sip of water.  2. Please check your INR onTuesday as usual so if we need to adjust your coumadin before the procedure we can.   3. If you are taking Aspirin please take it the day of your procedure.   4. DO NOT take any diabetic medications before your procedure until you resume a normal diet to avoid low blood sugars.   5. DO NOT take your torsemide before the procedure, as you will not be able to use the bathroom during the procedure. Resume as normal afterwards when you get home.   6. Bring a current list of your medications and current insurance cards.  7. Must have a responsible person to stay with you and drive you home.  8. Someone must be with you for the first 24 hours after you arrive home.  9. Please wear clothes that are easy to get on and off and wear slip-on shoes.  * Special note: Every effort is made to have your procedure done on time. Occasionally there are emergencies that present themselves at the hospital that may cause delays. Please be patient if a delay does occur.

## 2018-01-30 NOTE — H&P (View-Only) (Signed)
Patient presents for hospital  follow up in [facility] today. Reports no problems with VAD equipment or concerns with drive line.  Pt states the dialysis center has been using his new AV graft for dialysis. He is questioning when his tunneled cath will be removed.  Pt had f/u with Dr. Derrill Kay at [facility] and was told to eat smaller meals and increase Protonix to BID.  Vital Signs:  Doppler Pressure 90 Automatc BP: 104/76 (87) HR: 98 SPO2: UTO %  Weight: 177.2 lb w/o eqt Last weight: 176.6 lb Home weights: 170-175 lbs   VAD Indication: Destination therapy- Pt is scheulued for transplant eval at [facility]   VAD interrogation & Equipment Management: Speed:5500 Flow: 4.3 Power:4.0 w    PI: 3.6  Alarms: none Events: rare-Pt states that Dr. Posey Pronto increased his dry weight.This has made a big difference in the amount of PI events.   Fixed speed 5500 Low speed limit: 5200  Primary Controller:  Replace back up battery in 22 months. Back up controller:   Replace back up battery in 23 months.  Annual Equipment Maintenance on UBC/PM was performed on 06/2017.   I reviewed the LVAD parameters from today and compared the results to the patient's prior recorded data. LVAD interrogation was NEGATIVE for significant power changes, NEGATIVE for clinical alarms and STABLE for PI events/speed drops. No programming changes were made and pump is functioning within specified parameters. Pt is performing daily controller and system monitor self tests along with completing weekly and monthly maintenance for LVAD equipment.  LVAD equipment check completed and is in good working order. Back-up equipment present. Charged back up battery and performed self-test on equipment.   Exit Site Care: Drive line is being maintained weekly  by his daughter. Drive line exit site well healed and incorporated. The velour is fully implanted at exit site. Dressing dry and intact. No erythema or drainage. Stabilization  device present and accurately applied. Pt denies fever or chills. Pt states they have adequate dressing supplies at home.    Significant Events on VAD Support: -10/2016>> poss drive line infection, CT ABD neg, ID consult-doxy -11/09/16>> admit for poss drive line infection, IV abx -03/24/17>> doxy for poss drive line infection -05/04/17>> drive line debridement with wound-vac -06/2017>> drive line +proteus, IV abx, Bactrim x14 days  Device:St jude Dual Therapies: on. Monitor on at 150  BP & Labs:  MAP 90 - Doppler is reflecting MAP  Hgb 11.1 - No S/S of bleeding. Specifically denies melena/BRBPR or nosebleeds.  LDH stable at 263 with established baseline of 245- 350. Denies tea-colored urine. No power elevations noted on interrogation.   6 mo Intermacs follow up completed including:  Quality of Life, KCCQ-12, and Neurocognitive trail making (2 min 2 secs.   Pt completed 680  feet during 6 minute walk.  Back up controller:  11V backup battery charged during this visit.   Plan: 1. Script provided for Marinol. 2. Return to clinic in 2 months.   [name] RN VAD Coordinator   Office: [phone] 24/7 Emergency VAD Pager: [phone]      Cardiology: Dr. Aundra Dubin  HPI: 52 y.o. with history of CAD s/p anterior MI in 2010 and NSTEMI in 3/18 with DES to pRCA and mLAD and ischemic cardiomyopathy now s/p Heartmate 3 LVAD and ESRD presents for followup of CHF/LVAD.  Patient developed low output heart failure.  He was not deemed to be a good transplant candidate.  He was admitted in 10/18 and Heartmate 3  LVAD was placed.  He had baseline CKD stage 3 likely from diabetic nephropathy and CHF.  He developed peri-op AKI and ended up on dialysis.  No renal recovery to date.  St Jude ICD has been turned back on and ramp echo was done prior to discharge in 10/18, speed increased to 5600 rpm.    He was admitted in 11/18 with nausea/vomiting and abdominal pain.  This appears to have been  due to diabetic gastroparesis (extensive GI evaluation). He had atrial flutter this admission that was converted to NSR with amiodarone.  He improved considerably with scheduled Reglan and erythromycin. He was re-admitted later in 11/18 with the same symptoms, suspect diabetic gastroparesis and was again treated with erythromycin.  He was not able to start domperidone due to prolonged QT interval. He had nausea again about a week ago but was able to manage at home with Phenergan suppositories.   Admitted 1/8 -> 09/16/17 for uncontrolled vomiting in setting of diabetic gastroparesis. Improved with phenergan suppositories and given erythromycin TID for 7 days post-discharge.    He was admitted in 2/19 for AV graft placement.  While in the hospital, we turned down his speed.  Since then, he has had considerably fewer PI events on HD days.    He was admitted in 4/19 with nausea/vomiting again, probably gastroparesis flare.   He was admitted in 5/19 with nausea/vomiting, likely gastroparesis flare, treated with erythromycin course which he is finishing.   He has been evaluated at [facility] by Dr. Derrill Kay (gastroparesis specialist).   Gastric emptying study, surprisingly, was near normal. However, electrogastrogram showed poor gastric accomodation/capacity, "bradygastria."  Frequent small meals were recommended. Also, early am nausea was thought to be possible GERD.    He returns today for followup of LVAD and CHF. He is doing well today.  No dypsnea walking on flat ground.  Tolerating HD.  No nausea/vomiting currently, trying to eat small frequent meals.  No lightheadedness, MAP controlled.  His AV fistula is now usable.   Reports taking Coumadin as prescribed and adherence to anticoagulation based dietary restrictions. Denies BRBPR or melena. No dark urine or hematuria.   Labs (11/18): hgb 10.4 => 8.7, LDH 213 Labs (08/04/17): hgb 9 Labs (1/19): HgbA1c 6.1 Labs (2/19): hgb 13.2 Labs (3/19): hgb  11.1  PMH: 1. Chronic systolic CHF: Ischemic cardiomyopathy. St Jude ICD.  - Echo (11/17) with EF 25-30%, moderate diastolic dysfunction, mild MR, mildly dilated RV with moderately decreased systolic function, peak RV-RA gradient 48 mmHg.  - Echo (3/18) with EF 25-30%, wall motion abnormalities, normal RV size and systolic function.  - Admission 3/18 with cardiogenic shock and AKI, required milrinone gtt.  - Echo (8/18) with EF 20-25%, mild LVH, moderate LV dilation, mild MR.  - CPX (8/18) with peak VO2 12.4, VE/VCO2 slope 35, RER 1.05 => severe HR limitation.  - Heartmate 3 LVAD 10/18.  2. CAD: Anterior MI 2005, had bifurcation stenting LAD/diagonal. Repeat cath 2010 with patent stents.  - NSTEMI 3/18: LHC with 99% ulcerated lesion proximal RCA, 95% mid LAD => PCI with DES to proximal RCA and mid LAD.  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  - Lower extremity venous dopplers (8/18): chronic DVT - V/Q scan (8/18): Normal, no PE.  4. Crohns disease: History of colectomy.  5. GERD 6. Diabetic gastroparesis: Admission 11/18 with abdominal pain, nausea/vomiting.  - [facility] workup 2019: Gastric emptying study near normal.  Negative breath test (no evidence for small bowel bacterial overgrowth).  Electrogastrogram  showed poor gastric accomodation/capacity, "bradygastria."  7. HTN 8. Hyperlipidemia 9. Type II diabetes  10. ESRD: Post-op LVAD surgery.  11. Carotid dopplers (8/18): mild bilateral stenosis.  12. PFTs (8/18): Mild restriction (no IDL on CT chest).  13. Lung nodules: CT chest 8/18 with small bilateral nodules.  14. PAD: ABIs with moderate bilateral disease in 8/18.  15. Atrial flutter: In 11/18 associated with nausea/vomiting from gastroparesis.   Current Outpatient Medications  Medication Sig Dispense Refill  . amLODipine (NORVASC) 2.5 MG tablet Take 1 tablet (2.5 mg total) by mouth daily. 180 tablet 3  . aspirin EC 81 MG tablet Take 1 tablet (81 mg total) by mouth daily. 90 tablet 3  .  atorvastatin (LIPITOR) 40 MG tablet Take 1 tablet (40 mg total) by mouth daily at 6 PM. 30 tablet 6  . erythromycin (E-MYCIN) 250 MG tablet Take 1 tablet (250 mg total) by mouth 3 (three) times daily with meals for 11 days. 33 tablet 0  . hydrALAZINE (APRESOLINE) 25 MG tablet Take 1.5 tablets (37.5 mg total) by mouth 3 (three) times daily. Take on non-hd days 135 tablet 3  . insulin aspart (NOVOLOG) 100 UNIT/ML injection Inject 5 Units into the skin 3 (three) times daily with meals. 1 vial 0  . insulin glargine (LANTUS) 100 UNIT/ML injection Inject 0.1 mLs (10 Units total) into the skin daily.    [name] LORazepam (ATIVAN) 0.5 MG tablet Take 1 tablet (0.5 mg total) by mouth every 12 (twelve) hours as needed for anxiety or sleep. 30 tablet 0  . metoCLOPramide (REGLAN) 10 MG tablet Take 1 tablet (10 mg total) by mouth 3 (three) times daily. 90 tablet 6  . ondansetron (ZOFRAN ODT) 4 MG disintegrating tablet Take 1 tablet (4 mg total) by mouth every 6 (six) hours as needed for nausea or vomiting. 40 tablet 1  . pantoprazole (PROTONIX) 40 MG tablet Take 40 mg by mouth 2 (two) times daily.     . promethazine (PHENERGAN) 12.5 MG tablet Take 1 tablet (12.5 mg total) by mouth every 6 (six) hours as needed for nausea or vomiting. (Patient taking differently: Take 25 mg by mouth every 6 (six) hours as needed for nausea or vomiting. ) 30 tablet 0  . sildenafil (REVATIO) 20 MG tablet Take 1 tablet (20 mg total) by mouth 3 (three) times daily. 90 tablet 6  . warfarin (COUMADIN) 5 MG tablet Take 1 tablet (5 mg total) by mouth daily at 6 PM. 30 tablet 6  . docusate sodium (COLACE) 100 MG capsule Take 2 capsules (200 mg total) by mouth daily. (Patient not taking: Reported on 01/30/2018) 10 capsule 0  . dronabinol (MARINOL) 2.5 MG capsule Take 1 capsule (2.5 mg total) by mouth 2 (two) times daily before lunch and supper. 60 capsule 5   No current facility-administered medications for this encounter.     Metformin and  related   Review of systems complete and found to be negative unless listed in HPI.    BP 104/76 Comment: 87-map  Pulse 98   Ht 5' 8"  (1.727 m)   Wt 177 lb 3.2 oz (80.4 kg)   SpO2 98%   BMI 26.94 kg/m  MAP 87  VAD interrogation & Equipment Management: See nurse's note above.   GENERAL: Well appearing this am. NAD.  HEENT: Normal. NECK: Supple, JVP 7-8 cm. Carotids OK.  CARDIAC:  Mechanical heart sounds with LVAD hum present.  LUNGS:  CTAB, normal effort.  ABDOMEN:  NT, ND, no  HSM. No bruits or masses. +BS  LVAD exit site: Well-healed and incorporated. Dressing dry and intact. No erythema or drainage. Stabilization device present and accurately applied. Driveline dressing changed daily per sterile technique. EXTREMITIES:  Warm and dry. No cyanosis, clubbing, rash, or edema.  NEUROLOGIC:  Alert & oriented x 3. Cranial nerves grossly intact. Moves all 4 extremities w/o difficulty. Affect pleasant    ASSESSMENT AND PLAN: 1. Chronic systolic CHF: Ischemic cardiomyopathy.  St Jude ICD.  NSTEMI in March 2018 with DES to LAD and RCA, complicated by cardiogenic shock, low output requiring milrinone.  Echo 8/18 with EF 20-25%, moderate MR.  PX (8/18) with severe functional impairment due to HF.  He is now s/p HM3 LVAD placement. LVAD parameters reviewed today and stable, fewer PI events at HD now with lower speed.  No complaints. MAP controlled. NYHA II. Volume status stable on exam.  - Continue current BP regimen.   - Continue ASA 81.  Continue warfarin INR 2-3 for now with no overt GI bleeding.  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  - Continue hydralazine 37.5 mg tid and amlodipine 2.5 mg qd on non-HD days.  - He has been referred to [facility] for evaluation for heart/kidney transplant.  He needs RHC, I will arrange this for next week.  We discussed risks/benefits and he agrees to proceed with study.  2. ESRD: Developed peri-operatively after HM3 placement.  He is tolerating HD, AV  fistula in his arm is working currently. 3. CAD: NSTEMI in 3/18. LHC with 99% ulcerated lesion proximal RCA with left to right collaterals, 95% mid LAD stenosis after mid LAD stent. s/p PCI to RCA and LAD on 11/25/16.   - Continue statin and ASA. 4. h/o DVTs: Factor V Leiden heterozygote. - On coumadin. 5. Diabetic gastroparesis: Difficult to manage. He has seen gastroparesis specialist at [facility]. Surprisingly, gastric emptying study was normal.  However, electrogastrogram showed poor gastric accomodation/capacity.  Therefore, small frequent meals recommended.  Also morning nausea thought to be GERD and Protonix was increased.  - Continue reglan 5 mg tid/ac and Marinol.  - Continue Protonix bid.  - Make effort to eat small, more frequent meals.  6. DM: HgbA1c well-controlled recently.   7. HTN: MAP controlled on current meds.  8. PAD: Moderate bilateral disease on ABIs in 8/18.  Denies claudication currently, no pedal ulcers.  9. Atrial flutter: Paroxysmal.  He has been in NSR recently. Loralie Champagne, MD  01/30/2018

## 2018-01-30 NOTE — Progress Notes (Addendum)
Patient presents for hospital  follow up in [facility] today. Reports no problems with VAD equipment or concerns with drive line.  Pt states the dialysis center has been using his new AV graft for dialysis. He is questioning when his tunneled cath will be removed.  Pt had f/u with Dr. Derrill Kay at [facility] and was told to eat smaller meals and increase Protonix to BID.  Vital Signs:  Doppler Pressure 90 Automatc BP: 104/76 (87) HR: 98 SPO2: UTO %  Weight: 177.2 lb w/o eqt Last weight: 176.6 lb Home weights: 170-175 lbs   VAD Indication: Destination therapy- Pt is scheulued for transplant eval at [facility]   VAD interrogation & Equipment Management: Speed:5500 Flow: 4.3 Power:4.0 w    PI: 3.6  Alarms: none Events: rare-Pt states that Dr. Posey Pronto increased his dry weight.This has made a big difference in the amount of PI events.   Fixed speed 5500 Low speed limit: 5200  Primary Controller:  Replace back up battery in 22 months. Back up controller:   Replace back up battery in 23 months.  Annual Equipment Maintenance on UBC/PM was performed on 06/2017.   I reviewed the LVAD parameters from today and compared the results to the patient's prior recorded data. LVAD interrogation was NEGATIVE for significant power changes, NEGATIVE for clinical alarms and STABLE for PI events/speed drops. No programming changes were made and pump is functioning within specified parameters. Pt is performing daily controller and system monitor self tests along with completing weekly and monthly maintenance for LVAD equipment.  LVAD equipment check completed and is in good working order. Back-up equipment present. Charged back up battery and performed self-test on equipment.   Exit Site Care: Drive line is being maintained weekly  by his daughter. Drive line exit site well healed and incorporated. The velour is fully implanted at exit site. Dressing dry and intact. No erythema or drainage. Stabilization  device present and accurately applied. Pt denies fever or chills. Pt states they have adequate dressing supplies at home.    Significant Events on VAD Support: -10/2016>> poss drive line infection, CT ABD neg, ID consult-doxy -11/09/16>> admit for poss drive line infection, IV abx -03/24/17>> doxy for poss drive line infection -05/04/17>> drive line debridement with wound-vac -06/2017>> drive line +proteus, IV abx, Bactrim x14 days  Device:St jude Dual Therapies: on. Monitor on at 150  BP & Labs:  MAP 90 - Doppler is reflecting MAP  Hgb 11.1 - No S/S of bleeding. Specifically denies melena/BRBPR or nosebleeds.  LDH stable at 263 with established baseline of 245- 350. Denies tea-colored urine. No power elevations noted on interrogation.   6 mo Intermacs follow up completed including:  Quality of Life, KCCQ-12, and Neurocognitive trail making (2 min 2 secs.   Pt completed 680  feet during 6 minute walk.  Back up controller:  11V backup battery charged during this visit.   Plan: 1. Script provided for Marinol. 2. Return to clinic in 2 months.   [name] RN VAD Coordinator   Office: [phone] 24/7 Emergency VAD Pager: [phone]      Cardiology: Dr. Aundra Dubin  HPI: 52 y.o. with history of CAD s/p anterior MI in 2010 and NSTEMI in 3/18 with DES to pRCA and mLAD and ischemic cardiomyopathy now s/p Heartmate 3 LVAD and ESRD presents for followup of CHF/LVAD.  Patient developed low output heart failure.  He was not deemed to be a good transplant candidate.  He was admitted in 10/18 and Heartmate 3  LVAD was placed.  He had baseline CKD stage 3 likely from diabetic nephropathy and CHF.  He developed peri-op AKI and ended up on dialysis.  No renal recovery to date.  St Jude ICD has been turned back on and ramp echo was done prior to discharge in 10/18, speed increased to 5600 rpm.    He was admitted in 11/18 with nausea/vomiting and abdominal pain.  This appears to have been  due to diabetic gastroparesis (extensive GI evaluation). He had atrial flutter this admission that was converted to NSR with amiodarone.  He improved considerably with scheduled Reglan and erythromycin. He was re-admitted later in 11/18 with the same symptoms, suspect diabetic gastroparesis and was again treated with erythromycin.  He was not able to start domperidone due to prolonged QT interval. He had nausea again about a week ago but was able to manage at home with Phenergan suppositories.   Admitted 1/8 -> 09/16/17 for uncontrolled vomiting in setting of diabetic gastroparesis. Improved with phenergan suppositories and given erythromycin TID for 7 days post-discharge.    He was admitted in 2/19 for AV graft placement.  While in the hospital, we turned down his speed.  Since then, he has had considerably fewer PI events on HD days.    He was admitted in 4/19 with nausea/vomiting again, probably gastroparesis flare.   He was admitted in 5/19 with nausea/vomiting, likely gastroparesis flare, treated with erythromycin course which he is finishing.   He has been evaluated at [facility] by Dr. Derrill Kay (gastroparesis specialist).   Gastric emptying study, surprisingly, was near normal. However, electrogastrogram showed poor gastric accomodation/capacity, "bradygastria."  Frequent small meals were recommended. Also, early am nausea was thought to be possible GERD.    He returns today for followup of LVAD and CHF. He is doing well today.  No dypsnea walking on flat ground.  Tolerating HD.  No nausea/vomiting currently, trying to eat small frequent meals.  No lightheadedness, MAP controlled.  His AV fistula is now usable.   Reports taking Coumadin as prescribed and adherence to anticoagulation based dietary restrictions. Denies BRBPR or melena. No dark urine or hematuria.   Labs (11/18): hgb 10.4 => 8.7, LDH 213 Labs (08/04/17): hgb 9 Labs (1/19): HgbA1c 6.1 Labs (2/19): hgb 13.2 Labs (3/19): hgb  11.1  PMH: 1. Chronic systolic CHF: Ischemic cardiomyopathy. St Jude ICD.  - Echo (11/17) with EF 25-30%, moderate diastolic dysfunction, mild MR, mildly dilated RV with moderately decreased systolic function, peak RV-RA gradient 48 mmHg.  - Echo (3/18) with EF 25-30%, wall motion abnormalities, normal RV size and systolic function.  - Admission 3/18 with cardiogenic shock and AKI, required milrinone gtt.  - Echo (8/18) with EF 20-25%, mild LVH, moderate LV dilation, mild MR.  - CPX (8/18) with peak VO2 12.4, VE/VCO2 slope 35, RER 1.05 => severe HR limitation.  - Heartmate 3 LVAD 10/18.  2. CAD: Anterior MI 2005, had bifurcation stenting LAD/diagonal. Repeat cath 2010 with patent stents.  - NSTEMI 3/18: LHC with 99% ulcerated lesion proximal RCA, 95% mid LAD => PCI with DES to proximal RCA and mid LAD.  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  - Lower extremity venous dopplers (8/18): chronic DVT - V/Q scan (8/18): Normal, no PE.  4. Crohns disease: History of colectomy.  5. GERD 6. Diabetic gastroparesis: Admission 11/18 with abdominal pain, nausea/vomiting.  - [facility] workup 2019: Gastric emptying study near normal.  Negative breath test (no evidence for small bowel bacterial overgrowth).  Electrogastrogram  showed poor gastric accomodation/capacity, "bradygastria."  7. HTN 8. Hyperlipidemia 9. Type II diabetes  10. ESRD: Post-op LVAD surgery.  11. Carotid dopplers (8/18): mild bilateral stenosis.  12. PFTs (8/18): Mild restriction (no IDL on CT chest).  13. Lung nodules: CT chest 8/18 with small bilateral nodules.  14. PAD: ABIs with moderate bilateral disease in 8/18.  15. Atrial flutter: In 11/18 associated with nausea/vomiting from gastroparesis.   Current Outpatient Medications  Medication Sig Dispense Refill  . amLODipine (NORVASC) 2.5 MG tablet Take 1 tablet (2.5 mg total) by mouth daily. 180 tablet 3  . aspirin EC 81 MG tablet Take 1 tablet (81 mg total) by mouth daily. 90 tablet 3  .  atorvastatin (LIPITOR) 40 MG tablet Take 1 tablet (40 mg total) by mouth daily at 6 PM. 30 tablet 6  . erythromycin (E-MYCIN) 250 MG tablet Take 1 tablet (250 mg total) by mouth 3 (three) times daily with meals for 11 days. 33 tablet 0  . hydrALAZINE (APRESOLINE) 25 MG tablet Take 1.5 tablets (37.5 mg total) by mouth 3 (three) times daily. Take on non-hd days 135 tablet 3  . insulin aspart (NOVOLOG) 100 UNIT/ML injection Inject 5 Units into the skin 3 (three) times daily with meals. 1 vial 0  . insulin glargine (LANTUS) 100 UNIT/ML injection Inject 0.1 mLs (10 Units total) into the skin daily.    [name] LORazepam (ATIVAN) 0.5 MG tablet Take 1 tablet (0.5 mg total) by mouth every 12 (twelve) hours as needed for anxiety or sleep. 30 tablet 0  . metoCLOPramide (REGLAN) 10 MG tablet Take 1 tablet (10 mg total) by mouth 3 (three) times daily. 90 tablet 6  . ondansetron (ZOFRAN ODT) 4 MG disintegrating tablet Take 1 tablet (4 mg total) by mouth every 6 (six) hours as needed for nausea or vomiting. 40 tablet 1  . pantoprazole (PROTONIX) 40 MG tablet Take 40 mg by mouth 2 (two) times daily.     . promethazine (PHENERGAN) 12.5 MG tablet Take 1 tablet (12.5 mg total) by mouth every 6 (six) hours as needed for nausea or vomiting. (Patient taking differently: Take 25 mg by mouth every 6 (six) hours as needed for nausea or vomiting. ) 30 tablet 0  . sildenafil (REVATIO) 20 MG tablet Take 1 tablet (20 mg total) by mouth 3 (three) times daily. 90 tablet 6  . warfarin (COUMADIN) 5 MG tablet Take 1 tablet (5 mg total) by mouth daily at 6 PM. 30 tablet 6  . docusate sodium (COLACE) 100 MG capsule Take 2 capsules (200 mg total) by mouth daily. (Patient not taking: Reported on 01/30/2018) 10 capsule 0  . dronabinol (MARINOL) 2.5 MG capsule Take 1 capsule (2.5 mg total) by mouth 2 (two) times daily before lunch and supper. 60 capsule 5   No current facility-administered medications for this encounter.     Metformin and  related   Review of systems complete and found to be negative unless listed in HPI.    BP 104/76 Comment: 87-map  Pulse 98   Ht 5' 8"  (1.727 m)   Wt 177 lb 3.2 oz (80.4 kg)   SpO2 98%   BMI 26.94 kg/m  MAP 87  VAD interrogation & Equipment Management: See nurse's note above.   GENERAL: Well appearing this am. NAD.  HEENT: Normal. NECK: Supple, JVP 7-8 cm. Carotids OK.  CARDIAC:  Mechanical heart sounds with LVAD hum present.  LUNGS:  CTAB, normal effort.  ABDOMEN:  NT, ND, no  HSM. No bruits or masses. +BS  LVAD exit site: Well-healed and incorporated. Dressing dry and intact. No erythema or drainage. Stabilization device present and accurately applied. Driveline dressing changed daily per sterile technique. EXTREMITIES:  Warm and dry. No cyanosis, clubbing, rash, or edema.  NEUROLOGIC:  Alert & oriented x 3. Cranial nerves grossly intact. Moves all 4 extremities w/o difficulty. Affect pleasant    ASSESSMENT AND PLAN: 1. Chronic systolic CHF: Ischemic cardiomyopathy.  St Jude ICD.  NSTEMI in March 2018 with DES to LAD and RCA, complicated by cardiogenic shock, low output requiring milrinone.  Echo 8/18 with EF 20-25%, moderate MR.  PX (8/18) with severe functional impairment due to HF.  He is now s/p HM3 LVAD placement. LVAD parameters reviewed today and stable, fewer PI events at HD now with lower speed.  No complaints. MAP controlled. NYHA II. Volume status stable on exam.  - Continue current BP regimen.   - Continue ASA 81.  Continue warfarin INR 2-3 for now with no overt GI bleeding.  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  - Continue hydralazine 37.5 mg tid and amlodipine 2.5 mg qd on non-HD days.  - He has been referred to [facility] for evaluation for heart/kidney transplant.  He needs RHC, I will arrange this for next week.  We discussed risks/benefits and he agrees to proceed with study.  2. ESRD: Developed peri-operatively after HM3 placement.  He is tolerating HD, AV  fistula in his arm is working currently. 3. CAD: NSTEMI in 3/18. LHC with 99% ulcerated lesion proximal RCA with left to right collaterals, 95% mid LAD stenosis after mid LAD stent. s/p PCI to RCA and LAD on 11/25/16.   - Continue statin and ASA. 4. h/o DVTs: Factor V Leiden heterozygote. - On coumadin. 5. Diabetic gastroparesis: Difficult to manage. He has seen gastroparesis specialist at [facility]. Surprisingly, gastric emptying study was normal.  However, electrogastrogram showed poor gastric accomodation/capacity.  Therefore, small frequent meals recommended.  Also morning nausea thought to be GERD and Protonix was increased.  - Continue reglan 5 mg tid/ac and Marinol.  - Continue Protonix bid.  - Make effort to eat small, more frequent meals.  6. DM: HgbA1c well-controlled recently.   7. HTN: MAP controlled on current meds.  8. PAD: Moderate bilateral disease on ABIs in 8/18.  Denies claudication currently, no pedal ulcers.  9. Atrial flutter: Paroxysmal.  He has been in NSR recently. Loralie Champagne, MD  01/30/2018

## 2018-02-01 ENCOUNTER — Ambulatory Visit (HOSPITAL_COMMUNITY): Payer: Self-pay | Admitting: Pharmacist

## 2018-02-01 DIAGNOSIS — Z5181 Encounter for therapeutic drug level monitoring: Secondary | ICD-10-CM

## 2018-02-01 DIAGNOSIS — I82409 Acute embolism and thrombosis of unspecified deep veins of unspecified lower extremity: Secondary | ICD-10-CM

## 2018-02-01 DIAGNOSIS — Z95811 Presence of heart assist device: Secondary | ICD-10-CM

## 2018-02-01 LAB — POCT INR: INR: 4.4 — AB (ref 2.0–3.0)

## 2018-02-06 ENCOUNTER — Other Ambulatory Visit (HOSPITAL_COMMUNITY): Payer: Self-pay | Admitting: Unknown Physician Specialty

## 2018-02-06 ENCOUNTER — Ambulatory Visit (HOSPITAL_COMMUNITY): Payer: Self-pay | Admitting: Pharmacist

## 2018-02-06 DIAGNOSIS — Z5181 Encounter for therapeutic drug level monitoring: Secondary | ICD-10-CM

## 2018-02-06 DIAGNOSIS — Z95811 Presence of heart assist device: Secondary | ICD-10-CM

## 2018-02-06 DIAGNOSIS — I82409 Acute embolism and thrombosis of unspecified deep veins of unspecified lower extremity: Secondary | ICD-10-CM

## 2018-02-06 LAB — POCT INR: INR: 2.1 (ref 2.0–3.0)

## 2018-02-07 ENCOUNTER — Encounter (HOSPITAL_COMMUNITY): Payer: Self-pay

## 2018-02-08 ENCOUNTER — Encounter (HOSPITAL_COMMUNITY)
Admission: RE | Disposition: A | Discharge status: Home / Self Care | Payer: Self-pay | Source: Ambulatory Visit | Attending: Cardiology

## 2018-02-08 ENCOUNTER — Ambulatory Visit (HOSPITAL_COMMUNITY)
Admission: RE | Admit: 2018-02-08 | Discharge: 2018-02-08 | Disposition: A | Discharge status: Home / Self Care | Payer: BLUE CROSS/BLUE SHIELD | Source: Ambulatory Visit | Attending: Cardiology | Admitting: Cardiology

## 2018-02-08 DIAGNOSIS — Z01818 Encounter for other preprocedural examination: Secondary | ICD-10-CM | POA: Diagnosis not present

## 2018-02-08 DIAGNOSIS — I132 Hypertensive heart and chronic kidney disease with heart failure and with stage 5 chronic kidney disease, or end stage renal disease: Secondary | ICD-10-CM | POA: Diagnosis not present

## 2018-02-08 DIAGNOSIS — K5090 Crohn's disease, unspecified, without complications: Secondary | ICD-10-CM | POA: Diagnosis not present

## 2018-02-08 DIAGNOSIS — Z8674 Personal history of sudden cardiac arrest: Secondary | ICD-10-CM | POA: Insufficient documentation

## 2018-02-08 DIAGNOSIS — I2510 Atherosclerotic heart disease of native coronary artery without angina pectoris: Secondary | ICD-10-CM | POA: Diagnosis not present

## 2018-02-08 DIAGNOSIS — N186 End stage renal disease: Secondary | ICD-10-CM | POA: Diagnosis not present

## 2018-02-08 DIAGNOSIS — Z9049 Acquired absence of other specified parts of digestive tract: Secondary | ICD-10-CM | POA: Diagnosis not present

## 2018-02-08 DIAGNOSIS — K219 Gastro-esophageal reflux disease without esophagitis: Secondary | ICD-10-CM | POA: Insufficient documentation

## 2018-02-08 DIAGNOSIS — Z992 Dependence on renal dialysis: Secondary | ICD-10-CM | POA: Insufficient documentation

## 2018-02-08 DIAGNOSIS — Z79899 Other long term (current) drug therapy: Secondary | ICD-10-CM | POA: Insufficient documentation

## 2018-02-08 DIAGNOSIS — D6851 Activated protein C resistance: Secondary | ICD-10-CM | POA: Diagnosis not present

## 2018-02-08 DIAGNOSIS — Z9889 Other specified postprocedural states: Secondary | ICD-10-CM | POA: Insufficient documentation

## 2018-02-08 DIAGNOSIS — I255 Ischemic cardiomyopathy: Secondary | ICD-10-CM | POA: Insufficient documentation

## 2018-02-08 DIAGNOSIS — R918 Other nonspecific abnormal finding of lung field: Secondary | ICD-10-CM | POA: Diagnosis not present

## 2018-02-08 DIAGNOSIS — I6523 Occlusion and stenosis of bilateral carotid arteries: Secondary | ICD-10-CM | POA: Insufficient documentation

## 2018-02-08 DIAGNOSIS — I4892 Unspecified atrial flutter: Secondary | ICD-10-CM | POA: Insufficient documentation

## 2018-02-08 DIAGNOSIS — I252 Old myocardial infarction: Secondary | ICD-10-CM | POA: Diagnosis not present

## 2018-02-08 DIAGNOSIS — Z95810 Presence of automatic (implantable) cardiac defibrillator: Secondary | ICD-10-CM | POA: Diagnosis present

## 2018-02-08 DIAGNOSIS — Z7982 Long term (current) use of aspirin: Secondary | ICD-10-CM | POA: Insufficient documentation

## 2018-02-08 DIAGNOSIS — Z7901 Long term (current) use of anticoagulants: Secondary | ICD-10-CM | POA: Insufficient documentation

## 2018-02-08 DIAGNOSIS — E1143 Type 2 diabetes mellitus with diabetic autonomic (poly)neuropathy: Secondary | ICD-10-CM | POA: Diagnosis not present

## 2018-02-08 DIAGNOSIS — E1122 Type 2 diabetes mellitus with diabetic chronic kidney disease: Secondary | ICD-10-CM | POA: Diagnosis not present

## 2018-02-08 DIAGNOSIS — Z86718 Personal history of other venous thrombosis and embolism: Secondary | ICD-10-CM | POA: Diagnosis not present

## 2018-02-08 DIAGNOSIS — I5022 Chronic systolic (congestive) heart failure: Secondary | ICD-10-CM | POA: Insufficient documentation

## 2018-02-08 DIAGNOSIS — Z794 Long term (current) use of insulin: Secondary | ICD-10-CM | POA: Diagnosis not present

## 2018-02-08 DIAGNOSIS — E785 Hyperlipidemia, unspecified: Secondary | ICD-10-CM | POA: Diagnosis not present

## 2018-02-08 DIAGNOSIS — Z95811 Presence of heart assist device: Secondary | ICD-10-CM

## 2018-02-08 HISTORY — PX: RIGHT HEART CATH: CATH118263

## 2018-02-08 LAB — POCT ACTIVATED CLOTTING TIME: ACTIVATED CLOTTING TIME: 153 s

## 2018-02-08 LAB — BASIC METABOLIC PANEL
ANION GAP: 9 (ref 5–15)
BUN: 15 mg/dL (ref 6–20)
CALCIUM: 8.6 mg/dL — AB (ref 8.9–10.3)
CO2: 31 mmol/L (ref 22–32)
Chloride: 100 mmol/L — ABNORMAL LOW (ref 101–111)
Creatinine, Ser: 4.76 mg/dL — ABNORMAL HIGH (ref 0.61–1.24)
GFR calc non Af Amer: 13 mL/min — ABNORMAL LOW (ref >60)
GFR, EST AFRICAN AMERICAN: 15 mL/min — AB (ref >60)
Glucose, Bld: 180 mg/dL — ABNORMAL HIGH (ref 65–99)
Potassium: 4.1 mmol/L (ref 3.5–5.1)
Sodium: 140 mmol/L (ref 135–145)

## 2018-02-08 LAB — POCT I-STAT 3, VENOUS BLOOD GAS (G3P V)
ACID-BASE EXCESS: 4 mmol/L — AB (ref 0.0–2.0)
Acid-Base Excess: 4 mmol/L — ABNORMAL HIGH (ref 0.0–2.0)
Bicarbonate: 30.7 mmol/L — ABNORMAL HIGH (ref 20.0–28.0)
Bicarbonate: 30.8 mmol/L — ABNORMAL HIGH (ref 20.0–28.0)
O2 Saturation: 77 %
O2 Saturation: 77 %
PCO2 VEN: 53.9 mmHg (ref 44.0–60.0)
PO2 VEN: 44 mmHg (ref 32.0–45.0)
TCO2: 32 mmol/L (ref 22–32)
TCO2: 32 mmol/L (ref 22–32)
pCO2, Ven: 52.9 mmHg (ref 44.0–60.0)
pH, Ven: 7.365 (ref 7.250–7.430)
pH, Ven: 7.372 (ref 7.250–7.430)
pO2, Ven: 44 mmHg (ref 32.0–45.0)

## 2018-02-08 LAB — PROTIME-INR
INR: 1.79
PROTHROMBIN TIME: 20.6 s — AB (ref 11.4–15.2)

## 2018-02-08 LAB — CBC
HCT: 38.5 % — ABNORMAL LOW (ref 39.0–52.0)
HEMOGLOBIN: 11.9 g/dL — AB (ref 13.0–17.0)
MCH: 27.9 pg (ref 26.0–34.0)
MCHC: 30.9 g/dL (ref 30.0–36.0)
MCV: 90.2 fL (ref 78.0–100.0)
PLATELETS: 182 10*3/uL (ref 150–400)
RBC: 4.27 MIL/uL (ref 4.22–5.81)
RDW: 16.2 % — ABNORMAL HIGH (ref 11.5–15.5)
WBC: 7.7 10*3/uL (ref 4.0–10.5)

## 2018-02-08 LAB — GLUCOSE, CAPILLARY
Glucose-Capillary: 180 mg/dL — ABNORMAL HIGH (ref 65–99)
Glucose-Capillary: 114 mg/dL — ABNORMAL HIGH (ref 65–99)

## 2018-02-08 SURGERY — RIGHT HEART CATH
Anesthesia: LOCAL

## 2018-02-08 MED ORDER — LIDOCAINE HCL (PF) 1 % IJ SOLN
INTRAMUSCULAR | Status: DC | PRN
  Administered 2018-02-08: 15 mL

## 2018-02-08 MED ORDER — SODIUM CHLORIDE 0.9% FLUSH: 3.0000 mL | INTRAVENOUS | Status: DC | PRN

## 2018-02-08 MED ORDER — SODIUM CHLORIDE 0.9% FLUSH: 3.0000 mL | Freq: Two times a day (BID) | INTRAVENOUS | Status: DC

## 2018-02-08 MED ORDER — ACETAMINOPHEN 325 MG PO TABS: 650.0000 mg | ORAL_TABLET | ORAL | Status: DC | PRN

## 2018-02-08 MED ORDER — MIDAZOLAM HCL 2 MG/2ML IJ SOLN
INTRAMUSCULAR | Status: DC | PRN
  Administered 2018-02-08: 1 mg via INTRAVENOUS

## 2018-02-08 MED ORDER — HEPARIN (PORCINE) IN NACL 2-0.9 UNITS/ML
INTRAMUSCULAR | Status: AC | PRN
  Administered 2018-02-08: 500 mL

## 2018-02-08 MED ORDER — ONDANSETRON HCL 4 MG/2ML IJ SOLN: 4.0000 mg | Freq: Four times a day (QID) | INTRAMUSCULAR | Status: DC | PRN

## 2018-02-08 MED ORDER — MIDAZOLAM HCL 2 MG/2ML IJ SOLN
INTRAMUSCULAR | Status: AC
  Filled 2018-02-08: qty 2

## 2018-02-08 MED ORDER — LIDOCAINE HCL (PF) 1 % IJ SOLN
INTRAMUSCULAR | Status: AC
  Filled 2018-02-08: qty 30

## 2018-02-08 MED ORDER — SODIUM CHLORIDE 0.9 % IV SOLN: 250.0000 mL | INTRAVENOUS | Status: DC | PRN

## 2018-02-08 MED ORDER — SODIUM CHLORIDE 0.9 % IV SOLN: INTRAVENOUS | Status: DC

## 2018-02-08 MED ORDER — HEPARIN (PORCINE) IN NACL 1000-0.9 UT/500ML-% IV SOLN
INTRAVENOUS | Status: AC
  Filled 2018-02-08: qty 1000

## 2018-02-08 MED ORDER — FENTANYL CITRATE (PF) 100 MCG/2ML IJ SOLN
INTRAMUSCULAR | Status: AC
  Filled 2018-02-08: qty 2

## 2018-02-08 MED ORDER — WARFARIN - PHARMACIST DOSING INPATIENT: Freq: Every day | Status: DC

## 2018-02-08 MED ORDER — FENTANYL CITRATE (PF) 100 MCG/2ML IJ SOLN
INTRAMUSCULAR | Status: DC | PRN
  Administered 2018-02-08 (×2): 25 ug via INTRAVENOUS

## 2018-02-08 MED ORDER — WARFARIN SODIUM 6 MG PO TABS
6.0000 mg | ORAL_TABLET | Freq: Once | ORAL | Status: DC
  Filled 2018-02-08: qty 1

## 2018-02-08 SURGICAL SUPPLY — 7 items
CATH SWAN GANZ 7F STRAIGHT (CATHETERS) ×1 IMPLANT
COVER PRB 48X5XTLSCP FOLD TPE (BAG) IMPLANT
COVER PROBE 5X48 (BAG) ×2
PACK CARDIAC CATHETERIZATION (CUSTOM PROCEDURE TRAY) ×2 IMPLANT
SHEATH PINNACLE 7F 10CM (SHEATH) ×1 IMPLANT
TRANSDUCER W/STOPCOCK (MISCELLANEOUS) ×2 IMPLANT
TUBING CIL FLEX 10 FLL-RA (TUBING) ×2 IMPLANT

## 2018-02-08 NOTE — Progress Notes (Signed)
Right femoral 61F venous sheath / level 0 upon arrival to unit ACT was 153 Manual pressure held for 88mn. Bedrest times begins at 1142 Pt remained stable during pull Groin site was a level 0 post sheath pull Pt was provided post instructions  Right DP 1+ Gauze w/ transparent dressing applied to site Pt remains stable with VS WNL, will continue to monitor

## 2018-02-08 NOTE — Interval H&P Note (Signed)
History and Physical Interval Note:  02/08/2018 10:17 AM  [name]  has presented today for surgery, with the diagnosis of chf  The various methods of treatment have been discussed with the patient and family. After consideration of risks, benefits and other options for treatment, the patient has consented to  Procedure(s): RIGHT HEART CATH (N/A) as a surgical intervention .  The patient's history has been reviewed, patient examined, no change in status, stable for surgery.  I have reviewed the patient's chart and labs.  Questions were answered to the patient's satisfaction.     [name] [employer]

## 2018-02-08 NOTE — Discharge Instructions (Signed)
**Note -identified via Obfuscation** Femoral Site Care °Refer to this sheet in the next few weeks. These instructions provide you with information about caring for yourself after your procedure. Your health care provider may also give you more specific instructions. Your treatment has been planned according to current medical practices, but problems sometimes occur. Call your health care provider if you have any problems or questions after your procedure. °What can I expect after the procedure? °After your procedure, it is typical to have the following: °· Bruising at the site that usually fades within 1-2 weeks. °· Blood collecting in the tissue (hematoma) that may be painful to the touch. It should usually decrease in size and tenderness within 1-2 weeks. ° °Follow these instructions at home: °· Take medicines only as directed by your health care provider. °· You may shower 24-48 hours after the procedure or as directed by your health care provider. Remove the bandage (dressing) and gently wash the site with plain soap and water. Pat the area dry with a clean towel. Do not rub the site, because this may cause bleeding. °· Do not take baths, swim, or use a hot tub until your health care provider approves. °· Check your insertion site every day for redness, swelling, or drainage. °· Do not apply powder or lotion to the site. °· Limit use of stairs to twice a day for the first 2-3 days or as directed by your health care provider. °· Do not squat for the first 2-3 days or as directed by your health care provider. °· Do not lift over 10 lb (4.5 kg) for 5 days after your procedure or as directed by your health care provider. °· Ask your health care provider when it is okay to: °? Return to work or school. °? Resume usual physical activities or sports. °? Resume sexual activity. °· Do not drive home if you are discharged the same day as the procedure. Have someone else drive you. °· You may drive 24 hours after the procedure unless otherwise instructed by  your health care provider. °· Do not operate machinery or power tools for 24 hours after the procedure or as directed by your health care provider. °· If your procedure was done as an outpatient procedure, which means that you went home the same day as your procedure, a responsible adult should be with you for the first 24 hours after you arrive home. °· Keep all follow-up visits as directed by your health care provider. This is important. °Contact a health care provider if: °· You have a fever. °· You have chills. °· You have increased bleeding from the site. Hold pressure on the site. °Get help right away if: °· You have unusual pain at the site. °· You have redness, warmth, or swelling at the site. °· You have drainage (other than a small amount of blood on the dressing) from the site. °· The site is bleeding, and the bleeding does not stop after 30 minutes of holding steady pressure on the site. °· Your leg or foot becomes pale, cool, tingly, or numb. °This information is not intended to replace advice given to you by your health care provider. Make sure you discuss any questions you have with your health care provider. °Document Released: 04/25/2014 Document Revised: 01/28/2016 Document Reviewed: 03/11/2014 °Elsevier Interactive Patient Education © 2018 [employer] ° °

## 2018-02-08 NOTE — Progress Notes (Signed)
ANTICOAGULATION CONSULT NOTE - Initial Consult  Pharmacy Consult for warfarin Indication: recurrent DVTs and LVAD  Allergies  Allergen Reactions  . Metformin And Related Diarrhea    Patient Measurements: Height: 5' 7"  (170.2 cm) Weight: 170 lb (77.1 kg) IBW/kg (Calculated) : 66.1  Vital Signs: Temp: 98.2 F (36.8 C) (06/06 0543) Temp Source: Oral (06/06 0543) BP: 118/97 (06/06 1356) Pulse Rate: 77 (06/06 1413)  Labs: Recent Labs    02/06/18 02/08/18 0547  HGB  --  11.9*  HCT  --  38.5*  PLT  --  182  LABPROT  --  20.6*  INR 2.1 1.79  CREATININE  --  4.76*    Estimated Creatinine Clearance: 16.8 mL/min (A) (by C-G formula based on SCr of 4.76 mg/dL (H)).   Medical History: Past Medical History:  Diagnosis Date  . Angina   . ASCVD (arteriosclerotic cardiovascular disease)    , Anterior infarction 2005, LAD diagonal bifurcation intervention 03/2004  . Automatic implantable cardiac defibrillator -St. Jude's       . Benign neoplasm of colon   . CHF (congestive heart failure) ([place])   . Chronic systolic heart failure ([place])   . Coronary artery disease     Widely patent previously placed stents in the left anterior   . Crohn's disease ([place])   . 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 Dialysis patient ([place])   . Dyspnea   . Gastroparesis   . GERD (gastroesophageal reflux disease)   . High cholesterol   . Hyperlipidemia   . Hypersomnolent    Previous diagnosis of narcolepsy  . Hypertension, essential   . Ischemic cardiomyopathy    Ejection fraction 15-20% catheterization 2010  . Type II or unspecified type diabetes mellitus without mention of complication, not stated as uncontrolled   . Unspecified gastritis and gastroduodenitis without mention of hemorrhage     Medications:  Medications Prior to Admission  Medication Sig Dispense Refill Last Dose  . amLODipine (NORVASC) 2.5 MG tablet Take 1 tablet (2.5 mg total) by mouth daily. 180 tablet 3  02/07/2018 at Unknown time  . aspirin EC 81 MG tablet Take 1 tablet (81 mg total) by mouth daily. 90 tablet 3 02/07/2018 at 0800  . atorvastatin (LIPITOR) 40 MG tablet Take 1 tablet (40 mg total) by mouth daily at 6 PM. 30 tablet 6 02/07/2018 at Unknown time  . docusate sodium (COLACE) 100 MG capsule Take 2 capsules (200 mg total) by mouth daily. (Patient taking differently: Take 100 mg by mouth 2 (two) times daily. ) 10 capsule 0 Past Month at Unknown time  . dronabinol (MARINOL) 2.5 MG capsule Take 1 capsule (2.5 mg total) by mouth 2 (two) times daily before lunch and supper. 60 capsule 5 02/07/2018 at Unknown time  . hydrALAZINE (APRESOLINE) 25 MG tablet Take 1.5 tablets (37.5 mg total) by mouth 3 (three) times daily. Take on non-hd days 135 tablet 3 02/07/2018 at Unknown time  . insulin aspart (NOVOLOG) 100 UNIT/ML injection Inject 5 Units into the skin 3 (three) times daily with meals. 1 vial 0 02/07/2018 at Unknown time  . insulin glargine (LANTUS) 100 UNIT/ML injection Inject 0.1 mLs (10 Units total) into the skin daily.   02/07/2018 at Unknown time  . LORazepam (ATIVAN) 0.5 MG tablet Take 1 tablet (0.5 mg total) by mouth every 12 (twelve) hours as needed for anxiety or sleep. 30 tablet 0 Past Month at Unknown time  . metoCLOPramide (REGLAN) 10 MG tablet  Take 1 tablet (10 mg total) by mouth 3 (three) times daily. 90 tablet 6 02/07/2018 at Unknown time  . ondansetron (ZOFRAN ODT) 4 MG disintegrating tablet Take 1 tablet (4 mg total) by mouth every 6 (six) hours as needed for nausea or vomiting. 40 tablet 1 Past Month at Unknown time  . pantoprazole (PROTONIX) 40 MG tablet Take 40 mg by mouth 2 (two) times daily.    02/07/2018 at Unknown time  . promethazine (PHENERGAN) 12.5 MG tablet Take 1 tablet (12.5 mg total) by mouth every 6 (six) hours as needed for nausea or vomiting. 30 tablet 0 Past Month at Unknown time  . sildenafil (REVATIO) 20 MG tablet Take 1 tablet (20 mg total) by mouth 3 (three) times daily. 90  tablet 6 02/07/2018 at Unknown time  . warfarin (COUMADIN) 5 MG tablet Take 1 tablet (5 mg total) by mouth daily at 6 PM. 30 tablet 6 02/07/2018 at 2100    Assessment: 52 y/o male with Heartmate 3 LVAD and ESRD admitted s/p right heart cath. He takes warfarin PTA for recurrent DVTs and LVAD. INR is subtherapeutic at 1.79. No bleeding noted, Hgb 11.9, platelets are 182.  PTA regimen: 5 mg daily, last dose at home 6/5; clinic note states 5 mg daily except 2.5 mg Fri  Goal of Therapy:  INR 2-3 Monitor platelets by anticoagulation protocol: Yes   Plan:  Warfarin 6 mg PO tonight Daily INR Monitor for s/sx of bleeding   Renold Genta, PharmD, BCPS Clinical Pharmacist Clinical phone for 02/08/2018 until 10p is [phone] 02/08/2018 2:46 PM

## 2018-02-09 ENCOUNTER — Encounter (HOSPITAL_COMMUNITY): Payer: Self-pay | Admitting: Cardiology

## 2018-02-09 NOTE — Progress Notes (Signed)
VAD Coordinator Procedure Note:   VAD Coordinator met patient in cath lab. VAD parameters monitored by me throughout the procedure. MAPs were obtained with auto BP cuff on left arm and correlated with doppler modified systolic.    Doppler Auto cuff(MAP):   Flow: PI: Power:     Speed:              Pre-procedure: 10:00am 120  127/83 (93)  3.5 6.5 4.2       5500 10:15    131/99 (110)  3.4 6.8 4.1   Sedation Induction:  10:30    123/97 (107)  3.7 5.0 4.1  Recovery area: 10:45    121/91 (102)  3.7 4.9 4.0   Patient tolerated the procedure well.   Patient Disposition:  Cath lab recovery then home.  Zada Girt RN, VAD Coordinator 24/7 VAD Pager: [phone]

## 2018-02-13 ENCOUNTER — Telehealth: Payer: Self-pay | Admitting: *Deleted

## 2018-02-13 NOTE — Telephone Encounter (Signed)
-----   Message from Willy Eddy, RN sent at 02/13/2018  4:37 PM EDT ----- Regarding: FW: Dialysis catheter removal.    ----- Message ----- From: Willy Eddy, RN Sent: 02/13/2018   4:34 PM To: Willy Eddy, RN, Christinia Gully, RN Subject: RE: Dialysis catheter removal.                 THANK YOU ! I am having the order for [facility] removal scanned to chart and will be in MEDIA. Contact me if you have any further questions.  ----- Message ----- From: Christinia Gully, RN Sent: 02/13/2018   4:25 PM To: Larey Dresser, MD, Willy Eddy, RN, # Subject: RE: Dialysis catheter removal.                 Ok we will look into this. Thanks sarah ----- Message ----- From: Larey Dresser, MD Sent: 02/12/2018   9:44 PM To: Willy Eddy, RN, Christinia Gully, RN, # Subject: RE: Dialysis catheter removal.                 We will look into this, thanks.   Sarah/Molly, we need to discuss getting this done.  Will need to see what IR will take as the maximal INR that they will allow to pull the line.   ----- Message ----- From: Willy Eddy, RN Sent: 02/12/2018  10:05 AM To: Larey Dresser, MD Subject: Dialysis catheter removal.                     Marcelles is ready to have his Dialysis catheter removed as ordered by Dr. Lorrene Reid. 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 I was checking with you to see if there is a way to coordinate removal of his dialysis catheter with our PA's the next time he is in the hospital and eliminate an admission for this procedure. Appreciate your input. Thank you, Jacqlyn Larsen

## 2018-02-13 NOTE — Telephone Encounter (Signed)
-----   Message from Larey Dresser, MD sent at 02/12/2018  9:44 PM EDT ----- Regarding: RE: Dialysis catheter removal.  We will look into this, thanks.   Sarah/Molly, we need to discuss getting this done.  Will need to see what IR will take as the maximal INR that they will allow to pull the line.   ----- Message ----- From: Willy Eddy, RN Sent: 02/12/2018  10:05 AM To: Larey Dresser, MD Subject: Dialysis catheter removal.                     [name] is ready to have his Dialysis catheter removed as ordered by Dr. Lorrene Reid. Because of his VAD and the need to hold coumadin, for previous procedures with [facility], [name] was admitted and started on Heparin. I was checking with you to see if there is a way to coordinate removal of his dialysis catheter with our PA's the next time he is in the hospital and eliminate an admission for this procedure. Appreciate your input. Thank you, [name]

## 2018-02-14 ENCOUNTER — Telehealth (HOSPITAL_COMMUNITY): Payer: Self-pay | Admitting: Pharmacist

## 2018-02-14 LAB — POCT INR: INR: 1.9 — AB (ref 2.0–3.0)

## 2018-02-14 NOTE — Telephone Encounter (Signed)
Will drawn tomorrow

## 2018-02-15 ENCOUNTER — Ambulatory Visit (HOSPITAL_COMMUNITY): Payer: Self-pay | Admitting: Pharmacist

## 2018-02-15 DIAGNOSIS — Z95811 Presence of heart assist device: Secondary | ICD-10-CM

## 2018-02-15 DIAGNOSIS — Z5181 Encounter for therapeutic drug level monitoring: Secondary | ICD-10-CM

## 2018-02-19 ENCOUNTER — Other Ambulatory Visit (HOSPITAL_COMMUNITY): Payer: Self-pay | Admitting: *Deleted

## 2018-02-19 DIAGNOSIS — Z992 Dependence on renal dialysis: Secondary | ICD-10-CM

## 2018-02-21 ENCOUNTER — Ambulatory Visit: Payer: Self-pay | Admitting: Vascular Surgery

## 2018-02-22 LAB — POCT INR: INR: 2.6 (ref 2.0–3.0)

## 2018-02-23 ENCOUNTER — Ambulatory Visit (HOSPITAL_COMMUNITY): Payer: Self-pay | Admitting: Pharmacist

## 2018-02-23 DIAGNOSIS — Z5181 Encounter for therapeutic drug level monitoring: Secondary | ICD-10-CM

## 2018-02-23 DIAGNOSIS — Z95811 Presence of heart assist device: Secondary | ICD-10-CM

## 2018-02-26 ENCOUNTER — Ambulatory Visit (HOSPITAL_COMMUNITY)
Admission: RE | Admit: 2018-02-26 | Discharge: 2018-02-26 | Disposition: A | Discharge status: Home / Self Care | Payer: BLUE CROSS/BLUE SHIELD | Source: Ambulatory Visit | Attending: Cardiology | Admitting: Cardiology

## 2018-02-26 ENCOUNTER — Encounter (HOSPITAL_COMMUNITY): Payer: Self-pay | Admitting: Interventional Radiology

## 2018-02-26 DIAGNOSIS — N19 Unspecified kidney failure: Secondary | ICD-10-CM | POA: Insufficient documentation

## 2018-02-26 DIAGNOSIS — Z452 Encounter for adjustment and management of vascular access device: Secondary | ICD-10-CM | POA: Insufficient documentation

## 2018-02-26 DIAGNOSIS — Z992 Dependence on renal dialysis: Secondary | ICD-10-CM

## 2018-02-26 HISTORY — PX: IR REMOVAL TUN CV CATH W/O FL: IMG2289

## 2018-02-26 MED ORDER — CHLORHEXIDINE GLUCONATE 4 % EX LIQD
CUTANEOUS | Status: AC
Start: 2018-02-26 — End: 2018-02-26
  Filled 2018-02-26: qty 15

## 2018-02-26 MED ORDER — CHLORHEXIDINE GLUCONATE 4 % EX LIQD
CUTANEOUS | Status: DC | PRN
  Administered 2018-02-26: 1 via TOPICAL

## 2018-02-26 MED ORDER — LIDOCAINE HCL 1 % IJ SOLN
INTRAMUSCULAR | Status: AC
  Filled 2018-02-26: qty 20

## 2018-02-26 MED ORDER — LIDOCAINE HCL (PF) 1 % IJ SOLN
INTRAMUSCULAR | Status: DC | PRN
  Administered 2018-02-26: 2 mL

## 2018-02-26 NOTE — Procedures (Signed)
Interventional Radiology Procedure Note  Procedure: Bedside removal of right IJ tunneled HD cath, blunt/shart dissection. Removed in entirety.   Complications: None Recommendations:  - Dressing placed - Do not submerge for 7 days - Routine wound care   Signed,  Dulcy Fanny. Earleen Newport, DO

## 2018-02-28 ENCOUNTER — Encounter (HOSPITAL_COMMUNITY)
Admission: EM | Disposition: A | Discharge status: Home Health | Payer: Self-pay | Source: Home / Self Care | Attending: Internal Medicine

## 2018-02-28 ENCOUNTER — Inpatient Hospital Stay (HOSPITAL_COMMUNITY)
Admission: EM | Admit: 2018-02-28 | Discharge: 2018-03-19 | DRG: 001 | Disposition: A | Discharge status: Home Health | Payer: BLUE CROSS/BLUE SHIELD | Attending: Internal Medicine | Admitting: Internal Medicine

## 2018-02-28 ENCOUNTER — Telehealth (HOSPITAL_COMMUNITY): Payer: Self-pay | Admitting: Pharmacy Technician

## 2018-02-28 ENCOUNTER — Other Ambulatory Visit: Payer: Self-pay

## 2018-02-28 ENCOUNTER — Emergency Department (HOSPITAL_COMMUNITY): Payer: BLUE CROSS/BLUE SHIELD

## 2018-02-28 DIAGNOSIS — K660 Peritoneal adhesions (postprocedural) (postinfection): Secondary | ICD-10-CM | POA: Diagnosis present

## 2018-02-28 DIAGNOSIS — D638 Anemia in other chronic diseases classified elsewhere: Secondary | ICD-10-CM | POA: Diagnosis not present

## 2018-02-28 DIAGNOSIS — R000 Tachycardia, unspecified: Secondary | ICD-10-CM | POA: Diagnosis not present

## 2018-02-28 DIAGNOSIS — Y718 Miscellaneous cardiovascular devices associated with adverse incidents, not elsewhere classified: Secondary | ICD-10-CM | POA: Diagnosis present

## 2018-02-28 DIAGNOSIS — K3184 Gastroparesis: Secondary | ICD-10-CM | POA: Diagnosis present

## 2018-02-28 DIAGNOSIS — I2510 Atherosclerotic heart disease of native coronary artery without angina pectoris: Secondary | ICD-10-CM | POA: Diagnosis present

## 2018-02-28 DIAGNOSIS — R0600 Dyspnea, unspecified: Secondary | ICD-10-CM

## 2018-02-28 DIAGNOSIS — E1143 Type 2 diabetes mellitus with diabetic autonomic (poly)neuropathy: Secondary | ICD-10-CM

## 2018-02-28 DIAGNOSIS — I10 Essential (primary) hypertension: Secondary | ICD-10-CM | POA: Diagnosis not present

## 2018-02-28 DIAGNOSIS — Z9049 Acquired absence of other specified parts of digestive tract: Secondary | ICD-10-CM

## 2018-02-28 DIAGNOSIS — R570 Cardiogenic shock: Secondary | ICD-10-CM | POA: Diagnosis not present

## 2018-02-28 DIAGNOSIS — Z7189 Other specified counseling: Secondary | ICD-10-CM

## 2018-02-28 DIAGNOSIS — D72829 Elevated white blood cell count, unspecified: Secondary | ICD-10-CM

## 2018-02-28 DIAGNOSIS — N2581 Secondary hyperparathyroidism of renal origin: Secondary | ICD-10-CM | POA: Diagnosis present

## 2018-02-28 DIAGNOSIS — Z8371 Family history of colonic polyps: Secondary | ICD-10-CM

## 2018-02-28 DIAGNOSIS — K219 Gastro-esophageal reflux disease without esophagitis: Secondary | ICD-10-CM | POA: Diagnosis present

## 2018-02-28 DIAGNOSIS — L7632 Postprocedural hematoma of skin and subcutaneous tissue following other procedure: Secondary | ICD-10-CM | POA: Diagnosis not present

## 2018-02-28 DIAGNOSIS — J9811 Atelectasis: Secondary | ICD-10-CM | POA: Diagnosis not present

## 2018-02-28 DIAGNOSIS — I252 Old myocardial infarction: Secondary | ICD-10-CM | POA: Diagnosis not present

## 2018-02-28 DIAGNOSIS — G47419 Narcolepsy without cataplexy: Secondary | ICD-10-CM | POA: Diagnosis present

## 2018-02-28 DIAGNOSIS — Z794 Long term (current) use of insulin: Secondary | ICD-10-CM

## 2018-02-28 DIAGNOSIS — I160 Hypertensive urgency: Secondary | ICD-10-CM | POA: Diagnosis not present

## 2018-02-28 DIAGNOSIS — Z8249 Family history of ischemic heart disease and other diseases of the circulatory system: Secondary | ICD-10-CM

## 2018-02-28 DIAGNOSIS — J9600 Acute respiratory failure, unspecified whether with hypoxia or hypercapnia: Secondary | ICD-10-CM | POA: Diagnosis not present

## 2018-02-28 DIAGNOSIS — T829XXA Unspecified complication of cardiac and vascular prosthetic device, implant and graft, initial encounter: Secondary | ICD-10-CM | POA: Diagnosis not present

## 2018-02-28 DIAGNOSIS — I132 Hypertensive heart and chronic kidney disease with heart failure and with stage 5 chronic kidney disease, or end stage renal disease: Secondary | ICD-10-CM | POA: Diagnosis present

## 2018-02-28 DIAGNOSIS — E1122 Type 2 diabetes mellitus with diabetic chronic kidney disease: Secondary | ICD-10-CM | POA: Diagnosis present

## 2018-02-28 DIAGNOSIS — E871 Hypo-osmolality and hyponatremia: Secondary | ICD-10-CM | POA: Diagnosis not present

## 2018-02-28 DIAGNOSIS — E7800 Pure hypercholesterolemia, unspecified: Secondary | ICD-10-CM | POA: Diagnosis present

## 2018-02-28 DIAGNOSIS — D6851 Activated protein C resistance: Secondary | ICD-10-CM | POA: Diagnosis present

## 2018-02-28 DIAGNOSIS — K50919 Crohn's disease, unspecified, with unspecified complications: Secondary | ICD-10-CM

## 2018-02-28 DIAGNOSIS — T829XXD Unspecified complication of cardiac and vascular prosthetic device, implant and graft, subsequent encounter: Secondary | ICD-10-CM

## 2018-02-28 DIAGNOSIS — Z992 Dependence on renal dialysis: Secondary | ICD-10-CM | POA: Diagnosis not present

## 2018-02-28 DIAGNOSIS — Z95811 Presence of heart assist device: Secondary | ICD-10-CM

## 2018-02-28 DIAGNOSIS — Z7901 Long term (current) use of anticoagulants: Secondary | ICD-10-CM

## 2018-02-28 DIAGNOSIS — Z823 Family history of stroke: Secondary | ICD-10-CM

## 2018-02-28 DIAGNOSIS — K5090 Crohn's disease, unspecified, without complications: Secondary | ICD-10-CM | POA: Diagnosis present

## 2018-02-28 DIAGNOSIS — Z7982 Long term (current) use of aspirin: Secondary | ICD-10-CM

## 2018-02-28 DIAGNOSIS — E119 Type 2 diabetes mellitus without complications: Secondary | ICD-10-CM

## 2018-02-28 DIAGNOSIS — F419 Anxiety disorder, unspecified: Secondary | ICD-10-CM | POA: Diagnosis present

## 2018-02-28 DIAGNOSIS — I5042 Chronic combined systolic (congestive) and diastolic (congestive) heart failure: Secondary | ICD-10-CM | POA: Diagnosis present

## 2018-02-28 DIAGNOSIS — R112 Nausea with vomiting, unspecified: Secondary | ICD-10-CM | POA: Diagnosis not present

## 2018-02-28 DIAGNOSIS — Z833 Family history of diabetes mellitus: Secondary | ICD-10-CM

## 2018-02-28 DIAGNOSIS — I255 Ischemic cardiomyopathy: Secondary | ICD-10-CM | POA: Diagnosis present

## 2018-02-28 DIAGNOSIS — I5022 Chronic systolic (congestive) heart failure: Secondary | ICD-10-CM

## 2018-02-28 DIAGNOSIS — E785 Hyperlipidemia, unspecified: Secondary | ICD-10-CM | POA: Diagnosis present

## 2018-02-28 DIAGNOSIS — T82867A Thrombosis of cardiac prosthetic devices, implants and grafts, initial encounter: Secondary | ICD-10-CM | POA: Diagnosis not present

## 2018-02-28 DIAGNOSIS — N186 End stage renal disease: Secondary | ICD-10-CM

## 2018-02-28 DIAGNOSIS — Z452 Encounter for adjustment and management of vascular access device: Secondary | ICD-10-CM

## 2018-02-28 DIAGNOSIS — D631 Anemia in chronic kidney disease: Secondary | ICD-10-CM | POA: Diagnosis present

## 2018-02-28 DIAGNOSIS — Z95810 Presence of automatic (implantable) cardiac defibrillator: Secondary | ICD-10-CM | POA: Diagnosis not present

## 2018-02-28 DIAGNOSIS — I5023 Acute on chronic systolic (congestive) heart failure: Secondary | ICD-10-CM | POA: Diagnosis not present

## 2018-02-28 DIAGNOSIS — I5033 Acute on chronic diastolic (congestive) heart failure: Secondary | ICD-10-CM | POA: Diagnosis not present

## 2018-02-28 HISTORY — PX: INSERTION OF IMPLANTABLE LEFT VENTRICULAR ASSIST DEVICE: SHX5866

## 2018-02-28 HISTORY — PX: TEE WITHOUT CARDIOVERSION: SHX5443

## 2018-02-28 LAB — CBC WITH DIFFERENTIAL/PLATELET
Abs Immature Granulocytes: 0.1 10*3/uL (ref 0.0–0.1)
BASOS PCT: 0 %
Basophils Absolute: 0 10*3/uL (ref 0.0–0.1)
EOS ABS: 0 10*3/uL (ref 0.0–0.7)
Eosinophils Relative: 0 %
HCT: 39.4 % (ref 39.0–52.0)
Hemoglobin: 12.3 g/dL — ABNORMAL LOW (ref 13.0–17.0)
IMMATURE GRANULOCYTES: 1 %
Lymphocytes Relative: 5 %
Lymphs Abs: 0.8 10*3/uL (ref 0.7–4.0)
MCH: 27.6 pg (ref 26.0–34.0)
MCHC: 31.2 g/dL (ref 30.0–36.0)
MCV: 88.3 fL (ref 78.0–100.0)
Monocytes Absolute: 0.8 10*3/uL (ref 0.1–1.0)
Monocytes Relative: 5 %
NEUTROS PCT: 89 %
Neutro Abs: 14.8 10*3/uL — ABNORMAL HIGH (ref 1.7–7.7)
PLATELETS: 191 10*3/uL (ref 150–400)
RBC: 4.46 MIL/uL (ref 4.22–5.81)
RDW: 15.8 % — AB (ref 11.5–15.5)
WBC: 16.5 10*3/uL — ABNORMAL HIGH (ref 4.0–10.5)

## 2018-02-28 LAB — COMPREHENSIVE METABOLIC PANEL
ALT: 49 U/L — ABNORMAL HIGH (ref 0–44)
ANION GAP: 19 — AB (ref 5–15)
AST: 46 U/L — AB (ref 15–41)
Albumin: 3.8 g/dL (ref 3.5–5.0)
Alkaline Phosphatase: 127 U/L — ABNORMAL HIGH (ref 38–126)
BUN: 17 mg/dL (ref 6–20)
CHLORIDE: 93 mmol/L — AB (ref 98–111)
CO2: 21 mmol/L — ABNORMAL LOW (ref 22–32)
Calcium: 8.8 mg/dL — ABNORMAL LOW (ref 8.9–10.3)
Creatinine, Ser: 3.98 mg/dL — ABNORMAL HIGH (ref 0.61–1.24)
GFR, EST AFRICAN AMERICAN: 18 mL/min — AB (ref >60)
GFR, EST NON AFRICAN AMERICAN: 16 mL/min — AB (ref >60)
Glucose, Bld: 150 mg/dL — ABNORMAL HIGH (ref 70–99)
POTASSIUM: 4.4 mmol/L (ref 3.5–5.1)
Sodium: 133 mmol/L — ABNORMAL LOW (ref 135–145)
Total Bilirubin: 1.5 mg/dL — ABNORMAL HIGH (ref 0.3–1.2)
Total Protein: 8.1 g/dL (ref 6.5–8.1)

## 2018-02-28 LAB — PROTIME-INR
INR: 1.24
PROTHROMBIN TIME: 15.5 s — AB (ref 11.4–15.2)

## 2018-02-28 LAB — LACTATE DEHYDROGENASE: LDH: 280 U/L — ABNORMAL HIGH (ref 98–192)

## 2018-02-28 LAB — LIPASE, BLOOD: LIPASE: 21 U/L (ref 11–51)

## 2018-02-28 SURGERY — ECHOCARDIOGRAM, TRANSESOPHAGEAL
Anesthesia: General | Site: Chest

## 2018-02-28 MED ORDER — POTASSIUM CHLORIDE 2 MEQ/ML IV SOLN
80.0000 meq | INTRAVENOUS | Status: DC
  Filled 2018-02-28: qty 40

## 2018-02-28 MED ORDER — NOREPINEPHRINE 4 MG/250ML-% IV SOLN
0.0000 ug/min | INTRAVENOUS | Status: AC
  Administered 2018-03-01: 2 ug/min via INTRAVENOUS
  Filled 2018-02-28 (×2): qty 250

## 2018-02-28 MED ORDER — TRANEXAMIC ACID (OHS) BOLUS VIA INFUSION
15.0000 mg/kg | INTRAVENOUS | Status: AC
  Administered 2018-03-01: 1200 mg via INTRAVENOUS
  Filled 2018-02-28: qty 1200

## 2018-02-28 MED ORDER — TEMAZEPAM 7.5 MG PO CAPS: 15.0000 mg | ORAL_CAPSULE | Freq: Once | ORAL | Status: DC | PRN

## 2018-02-28 MED ORDER — SODIUM CHLORIDE 0.9 % IV SOLN
1.5000 g | INTRAVENOUS | Status: AC
  Administered 2018-03-01: 1.5 g via INTRAVENOUS
  Filled 2018-02-28: qty 1.5

## 2018-02-28 MED ORDER — CHLORHEXIDINE GLUCONATE CLOTH 2 % EX PADS: 6.0000 | MEDICATED_PAD | Freq: Once | CUTANEOUS | Status: DC

## 2018-02-28 MED ORDER — BISACODYL 5 MG PO TBEC: 5.0000 mg | DELAYED_RELEASE_TABLET | Freq: Once | ORAL | Status: DC

## 2018-02-28 MED ORDER — SODIUM CHLORIDE 0.9 % IV SOLN
INTRAVENOUS | Status: AC
  Administered 2018-03-01: 1 [IU]/h via INTRAVENOUS
  Filled 2018-02-28: qty 1

## 2018-02-28 MED ORDER — SODIUM CHLORIDE 0.9 % IV SOLN
750.0000 mg | INTRAVENOUS | Status: DC
  Filled 2018-02-28: qty 750

## 2018-02-28 MED ORDER — SODIUM CHLORIDE 0.9 % IV SOLN
INTRAVENOUS | Status: DC
  Filled 2018-02-28: qty 30

## 2018-02-28 MED ORDER — CHLORHEXIDINE GLUCONATE 0.12 % MT SOLN
15.0000 mL | Freq: Once | OROMUCOSAL | Status: DC
  Filled 2018-02-28: qty 15

## 2018-02-28 MED ORDER — DOBUTAMINE IN D5W 4-5 MG/ML-% IV SOLN
2.0000 ug/kg/min | INTRAVENOUS | Status: DC
  Filled 2018-02-28 (×2): qty 250

## 2018-02-28 MED ORDER — SODIUM CHLORIDE 0.9 % IV BOLUS
250.0000 mL | Freq: Once | INTRAVENOUS | Status: AC
  Administered 2018-02-28: 250 mL via INTRAVENOUS

## 2018-02-28 MED ORDER — HEPARIN (PORCINE) IN NACL 100-0.45 UNIT/ML-% IJ SOLN
1400.0000 [IU]/h | INTRAMUSCULAR | Status: DC
  Administered 2018-02-28: 1400 [IU]/h via INTRAVENOUS
  Filled 2018-02-28: qty 250

## 2018-02-28 MED ORDER — VANCOMYCIN HCL 1 G IV SOLR
1000.0000 mg | INTRAVENOUS | Status: AC
  Administered 2018-03-01: 1000 mg
  Filled 2018-02-28: qty 1000

## 2018-02-28 MED ORDER — TRANEXAMIC ACID (OHS) PUMP PRIME SOLUTION
2.0000 mg/kg | INTRAVENOUS | Status: DC
  Filled 2018-02-28: qty 1.6

## 2018-02-28 MED ORDER — SODIUM CHLORIDE 0.9 % IV BOLUS
750.0000 mL | Freq: Once | INTRAVENOUS | Status: AC
  Administered 2018-02-28: 750 mL via INTRAVENOUS

## 2018-02-28 MED ORDER — MAGNESIUM SULFATE 50 % IJ SOLN
40.0000 meq | INTRAMUSCULAR | Status: DC
  Filled 2018-02-28: qty 9.85

## 2018-02-28 MED ORDER — CHLORHEXIDINE GLUCONATE CLOTH 2 % EX PADS
6.0000 | MEDICATED_PAD | Freq: Once | CUTANEOUS | Status: AC
  Administered 2018-03-01: 6 via TOPICAL

## 2018-02-28 MED ORDER — EPINEPHRINE PF 1 MG/ML IJ SOLN
0.0000 ug/min | INTRAVENOUS | Status: AC
  Administered 2018-03-01: 2 ug/min via INTRAVENOUS
  Filled 2018-02-28: qty 4

## 2018-02-28 MED ORDER — METOCLOPRAMIDE HCL 5 MG/ML IJ SOLN
10.0000 mg | Freq: Once | INTRAMUSCULAR | Status: AC
  Administered 2018-02-28: 10 mg via INTRAVENOUS
  Filled 2018-02-28: qty 2

## 2018-02-28 MED ORDER — DEXMEDETOMIDINE HCL IN NACL 400 MCG/100ML IV SOLN
0.1000 ug/kg/h | INTRAVENOUS | Status: AC
  Administered 2018-03-01: .3 ug/kg/h via INTRAVENOUS
  Filled 2018-02-28: qty 100

## 2018-02-28 MED ORDER — VANCOMYCIN HCL 10 G IV SOLR
1250.0000 mg | INTRAVENOUS | Status: AC
  Administered 2018-03-01: 1250 mg via INTRAVENOUS
  Filled 2018-02-28: qty 1250

## 2018-02-28 MED ORDER — IOHEXOL 300 MG/ML  SOLN
75.0000 mL | Freq: Once | INTRAMUSCULAR | Status: AC | PRN
  Administered 2018-02-28: 75 mL via INTRAVENOUS

## 2018-02-28 MED ORDER — MUPIROCIN 2 % EX OINT
1.0000 "application " | TOPICAL_OINTMENT | Freq: Two times a day (BID) | CUTANEOUS | Status: DC
  Administered 2018-03-01: 1 via NASAL
  Filled 2018-02-28 (×2): qty 22

## 2018-02-28 MED ORDER — PROMETHAZINE HCL 25 MG/ML IJ SOLN
12.5000 mg | Freq: Once | INTRAMUSCULAR | Status: AC
  Administered 2018-02-28: 12.5 mg via INTRAVENOUS
  Filled 2018-02-28: qty 1

## 2018-02-28 MED ORDER — NITROGLYCERIN IN D5W 200-5 MCG/ML-% IV SOLN
0.0000 ug/min | INTRAVENOUS | Status: DC
  Filled 2018-02-28: qty 250

## 2018-02-28 MED ORDER — MILRINONE LACTATE IN DEXTROSE 20-5 MG/100ML-% IV SOLN
0.3000 ug/kg/min | INTRAVENOUS | Status: AC
  Administered 2018-03-01: .25 ug/kg/min via INTRAVENOUS
  Filled 2018-02-28: qty 100

## 2018-02-28 MED ORDER — VASOPRESSIN 20 UNIT/ML IV SOLN
0.0400 [IU]/min | INTRAVENOUS | Status: DC
  Filled 2018-02-28: qty 2

## 2018-02-28 MED ORDER — DOPAMINE-DEXTROSE 3.2-5 MG/ML-% IV SOLN
0.0000 ug/kg/min | INTRAVENOUS | Status: DC
  Filled 2018-02-28: qty 250

## 2018-02-28 MED ORDER — SODIUM CHLORIDE 0.9 % IV SOLN
600.0000 mg | INTRAVENOUS | Status: AC
  Administered 2018-03-01: 600 mg via INTRAVENOUS
  Filled 2018-02-28: qty 600

## 2018-02-28 MED ORDER — SODIUM CHLORIDE 0.9 % IV SOLN
1.5000 mg/kg/h | INTRAVENOUS | Status: AC
Start: 2018-03-01 — End: 2018-03-01
  Administered 2018-03-01: 1.5 mg/kg/h via INTRAVENOUS
  Filled 2018-02-28: qty 25

## 2018-02-28 MED ORDER — SODIUM CHLORIDE 0.9 % IV SOLN
0.0000 ug/min | INTRAVENOUS | Status: AC
  Administered 2018-03-01: 20 ug/min via INTRAVENOUS
  Filled 2018-02-28: qty 2

## 2018-02-28 MED ORDER — FLUCONAZOLE IN SODIUM CHLORIDE 400-0.9 MG/200ML-% IV SOLN
400.0000 mg | INTRAVENOUS | Status: DC
  Filled 2018-02-28: qty 200

## 2018-02-28 SURGICAL SUPPLY — 121 items
ADAPTER CARDIO PERF ANTE/RETRO (ADAPTER) ×4 IMPLANT
ADPR PRFSN 84XANTGRD RTRGD (ADAPTER) ×2
BAG DECANTER FOR FLEXI CONT (MISCELLANEOUS) ×4 IMPLANT
BANDAGE ACE 4X5 VEL STRL LF (GAUZE/BANDAGES/DRESSINGS) ×2 IMPLANT
BANDAGE ACE 6X5 VEL STRL LF (GAUZE/BANDAGES/DRESSINGS) ×2 IMPLANT
BASKET HEART  (ORDER IN 25'S) (MISCELLANEOUS)
BASKET HEART (ORDER IN 25'S) (MISCELLANEOUS)
BASKET HEART (ORDER IN 25S) (MISCELLANEOUS) ×2 IMPLANT
BLADE CLIPPER SURG (BLADE) ×2 IMPLANT
BLADE CORE FAN STRYKER (BLADE) ×2 IMPLANT
BLADE STERNUM SYSTEM 6 (BLADE) ×4 IMPLANT
BLADE SURG 12 STRL SS (BLADE) ×4 IMPLANT
BLADE SURG 15 STRL LF DISP TIS (BLADE) IMPLANT
BLADE SURG 15 STRL SS (BLADE) ×4
BLADE SURG ROTATE 9660 (MISCELLANEOUS) ×2 IMPLANT
BNDG GAUZE ELAST 4 BULKY (GAUZE/BANDAGES/DRESSINGS) ×2 IMPLANT
CANISTER SUCT 3000ML PPV (MISCELLANEOUS) ×4 IMPLANT
CANNULA EZ GLIDE AORTIC 21FR (CANNULA) ×2 IMPLANT
CANNULA GUNDRY RCSP 15FR (MISCELLANEOUS) ×4 IMPLANT
CATH CPB KIT VANTRIGT (MISCELLANEOUS) ×4 IMPLANT
CATH HYDRAGLIDE XL THORACIC (CATHETERS) ×2 IMPLANT
CATH ROBINSON RED A/P 18FR (CATHETERS) ×12 IMPLANT
CATH THORACIC 36FR RT ANG (CATHETERS) ×6 IMPLANT
COVER MAYO STAND STRL (DRAPES) ×2 IMPLANT
COVER SURGICAL LIGHT HANDLE (MISCELLANEOUS) ×2 IMPLANT
COVER TRANSDUCER CIVFLEX 5.5 (CAP) ×2 IMPLANT
CRADLE DONUT ADULT HEAD (MISCELLANEOUS) ×4 IMPLANT
DRAIN CHANNEL 32F RND 10.7 FF (WOUND CARE) ×4 IMPLANT
DRAPE CARDIOVASCULAR INCISE (DRAPES) ×4
DRAPE SLUSH/WARMER DISC (DRAPES) ×4 IMPLANT
DRAPE SRG 135X102X78XABS (DRAPES) ×2 IMPLANT
DRAPE TABLE COVER HEAVY DUTY (DRAPES) ×2 IMPLANT
DRSG AQUACEL AG ADV 3.5X14 (GAUZE/BANDAGES/DRESSINGS) ×4 IMPLANT
ELECT BLADE 4.0 EZ CLEAN MEGAD (MISCELLANEOUS) ×4
ELECT BLADE 6.5 EXT (BLADE) ×4 IMPLANT
ELECT CAUTERY BLADE 6.4 (BLADE) ×4 IMPLANT
ELECT REM PT RETURN 9FT ADLT (ELECTROSURGICAL) ×8
ELECTRODE BLDE 4.0 EZ CLN MEGD (MISCELLANEOUS) ×2 IMPLANT
ELECTRODE REM PT RTRN 9FT ADLT (ELECTROSURGICAL) ×4 IMPLANT
FELT TEFLON 1X6 (MISCELLANEOUS) ×8 IMPLANT
FEMORAL VENOUS CANN RAP (CANNULA) ×2 IMPLANT
FLOSEAL 5ML (HEMOSTASIS) ×2 IMPLANT
GAUZE PACKING IODOFORM 1X5 (MISCELLANEOUS) ×2 IMPLANT
GAUZE PACKING IODOFORM 2 (PACKING) ×2 IMPLANT
GAUZE SPONGE 4X4 12PLY STRL (GAUZE/BANDAGES/DRESSINGS) ×10 IMPLANT
GLOVE BIO SURGEON STRL SZ 6 (GLOVE) ×2 IMPLANT
GLOVE BIO SURGEON STRL SZ 6.5 (GLOVE) ×5 IMPLANT
GLOVE BIO SURGEON STRL SZ7.5 (GLOVE) ×14 IMPLANT
GLOVE BIO SURGEONS STRL SZ 6.5 (GLOVE) ×5
GOWN STRL REUS W/ TWL LRG LVL3 (GOWN DISPOSABLE) ×8 IMPLANT
GOWN STRL REUS W/TWL LRG LVL3 (GOWN DISPOSABLE) ×24
HEMOSTAT POWDER SURGIFOAM 1G (HEMOSTASIS) ×6 IMPLANT
HEMOSTAT SURGICEL 2X14 (HEMOSTASIS) ×2 IMPLANT
INSERT FOGARTY XLG (MISCELLANEOUS) IMPLANT
KIT BASIN OR (CUSTOM PROCEDURE TRAY) ×4 IMPLANT
KIT DILATOR VASC 18G NDL (KITS) ×2 IMPLANT
KIT DRAINAGE VACCUM ASSIST (KITS) ×2 IMPLANT
KIT LVAD HEARTMATE 3 W-CNTRL (Prosthesis & Implant Heart) ×1 IMPLANT
KIT LVAD HEARTMATE III W-CNTRL (Prosthesis & Implant Heart) ×1 IMPLANT
KIT SUCTION CATH 14FR (SUCTIONS) ×4 IMPLANT
KIT TURNOVER KIT B (KITS) ×4 IMPLANT
KIT VASOVIEW HEMOPRO VH 3000 (KITS) ×2 IMPLANT
LEAD PACING MYOCARDI (MISCELLANEOUS) ×4 IMPLANT
LINE VENT (MISCELLANEOUS) ×2 IMPLANT
MARKER GRAFT CORONARY BYPASS (MISCELLANEOUS) ×6 IMPLANT
NS IRRIG 1000ML POUR BTL (IV SOLUTION) ×18 IMPLANT
Outflow Graft Clip ×2 IMPLANT
PACK E OPEN HEART (SUTURE) ×4 IMPLANT
PACK OPEN HEART (CUSTOM PROCEDURE TRAY) ×4 IMPLANT
PAD ARMBOARD 7.5X6 YLW CONV (MISCELLANEOUS) ×8 IMPLANT
PAD ELECT DEFIB RADIOL ZOLL (MISCELLANEOUS) ×4 IMPLANT
PENCIL BUTTON HOLSTER BLD 10FT (ELECTRODE) ×2 IMPLANT
PUNCH AORTIC ROTATE 4.0MM (MISCELLANEOUS) IMPLANT
PUNCH AORTIC ROTATE 4.5MM 8IN (MISCELLANEOUS) IMPLANT
PUNCH AORTIC ROTATE 5MM 8IN (MISCELLANEOUS) IMPLANT
SEALANT SURG COSEAL 8ML (VASCULAR PRODUCTS) ×2 IMPLANT
SET CARDIOPLEGIA MPS 5001102 (MISCELLANEOUS) ×2 IMPLANT
SHEATH AVANTI 11CM 5FR (SHEATH) ×2 IMPLANT
SPONGE LAP 18X18 X RAY DECT (DISPOSABLE) ×8 IMPLANT
STOPCOCK 4 WAY LG BORE MALE ST (IV SETS) ×2 IMPLANT
SURGIFLO W/THROMBIN 8M KIT (HEMOSTASIS) ×2 IMPLANT
SUT BONE WAX W31G (SUTURE) ×4 IMPLANT
SUT ETHILON 3 0 FSL (SUTURE) ×2 IMPLANT
SUT MNCRL AB 4-0 PS2 18 (SUTURE) IMPLANT
SUT PROLENE 3 0 SH DA (SUTURE) IMPLANT
SUT PROLENE 3 0 SH1 36 (SUTURE) IMPLANT
SUT PROLENE 4 0 RB 1 (SUTURE) ×36
SUT PROLENE 4 0 SH DA (SUTURE) ×12 IMPLANT
SUT PROLENE 4-0 RB1 .5 CRCL 36 (SUTURE) ×2 IMPLANT
SUT PROLENE 5 0 C 1 36 (SUTURE) IMPLANT
SUT PROLENE 6 0 C 1 30 (SUTURE) IMPLANT
SUT PROLENE 6 0 CC (SUTURE) ×6 IMPLANT
SUT PROLENE 8 0 BV175 6 (SUTURE) IMPLANT
SUT PROLENE BLUE 7 0 (SUTURE) ×4 IMPLANT
SUT SILK  1 MH (SUTURE) ×8
SUT SILK 1 MH (SUTURE) IMPLANT
SUT SILK 2 0 SH CR/8 (SUTURE) IMPLANT
SUT SILK 3 0 SH CR/8 (SUTURE) IMPLANT
SUT STEEL 6MS V (SUTURE) ×8 IMPLANT
SUT STEEL SZ 6 DBL 3X14 BALL (SUTURE) ×4 IMPLANT
SUT VIC AB 1 CTX 36 (SUTURE) ×8
SUT VIC AB 1 CTX36XBRD ANBCTR (SUTURE) ×4 IMPLANT
SUT VIC AB 2-0 CT1 27 (SUTURE)
SUT VIC AB 2-0 CT1 TAPERPNT 27 (SUTURE) IMPLANT
SUT VIC AB 2-0 CTX 27 (SUTURE) IMPLANT
SUT VIC AB 3-0 SH 8-18 (SUTURE) ×2 IMPLANT
SUT VIC AB 3-0 X1 27 (SUTURE) ×4 IMPLANT
SYSTEM SAHARA CHEST DRAIN ATS (WOUND CARE) ×4 IMPLANT
TAPE CLOTH SURG 4X10 WHT LF (GAUZE/BANDAGES/DRESSINGS) ×6 IMPLANT
TAPE PAPER 2X10 WHT MICROPORE (GAUZE/BANDAGES/DRESSINGS) ×2 IMPLANT
TOWEL GREEN STERILE (TOWEL DISPOSABLE) ×4 IMPLANT
TOWEL GREEN STERILE FF (TOWEL DISPOSABLE) ×4 IMPLANT
TRAY CATH LUMEN 1 20CM STRL (SET/KITS/TRAYS/PACK) ×2 IMPLANT
TRAY FOLEY SLVR 16FR TEMP STAT (SET/KITS/TRAYS/PACK) ×4 IMPLANT
TUBE CONNECTING 20'X1/4 (TUBING) ×1
TUBE CONNECTING 20X1/4 (TUBING) ×1 IMPLANT
TUBING INSUFFLATION (TUBING) ×2 IMPLANT
Thoratec Heartmate 3  - Accessories Kit ×1 IMPLANT
UNDERPAD 30X30 (UNDERPADS AND DIAPERS) ×4 IMPLANT
WATER STERILE IRR 1000ML POUR (IV SOLUTION) ×8 IMPLANT
YANKAUER SUCT BULB TIP NO VENT (SUCTIONS) ×2 IMPLANT

## 2018-02-28 NOTE — ED Notes (Signed)
Paged Dr. Celso Sickle

## 2018-02-28 NOTE — ED Triage Notes (Signed)
Pt to ED via [facility] with c/o L VAD alarm going off. Also c/o nausea and vomiting.  Pt is on dialysis  And went today but no fluid was removed

## 2018-02-28 NOTE — Progress Notes (Signed)
ANTICOAGULATION CONSULT NOTE - Initial Consult  Pharmacy Consult for heparin Indication: DVT and LVAD  Allergies  Allergen Reactions  . Metformin And Related Diarrhea    Patient Measurements: Height: 5' 7"  (170.2 cm) Weight: 176 lb 5.9 oz (80 kg) IBW/kg (Calculated) : 66.1  Vital Signs: Temp: 96.6 F (35.9 C) (06/26 2040) Temp Source: Temporal (06/26 2040) BP: 127/85 (06/26 2200) Pulse Rate: 100 (06/26 2200)  Labs: Recent Labs    02/28/18 2108 02/28/18 2139  HGB 12.3*  --   HCT 39.4  --   PLT 191  --   LABPROT  --  15.5*  INR  --  1.24  CREATININE 3.98*  --     Estimated Creatinine Clearance: 21.8 mL/min (A) (by C-G formula based on SCr of 3.98 mg/dL (H)).   Medical History: Past Medical History:  Diagnosis Date  . Angina   . ASCVD (arteriosclerotic cardiovascular disease)    , Anterior infarction 2005, LAD diagonal bifurcation intervention 03/2004  . Automatic implantable cardiac defibrillator -[employer]       . Benign neoplasm of colon   . CHF (congestive heart failure) ()   . Chronic systolic heart failure ([place])   . Coronary artery disease     Widely patent previously placed stents in the left anterior   . Crohn's disease ([place])   . 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 Dialysis patient ([place])   . Dyspnea   . Gastroparesis   . GERD (gastroesophageal reflux disease)   . High cholesterol   . Hyperlipidemia   . Hypersomnolent    Previous diagnosis of narcolepsy  . Hypertension, essential   . Ischemic cardiomyopathy    Ejection fraction 15-20% catheterization 2010  . Type II or unspecified type diabetes mellitus without mention of complication, not stated as uncontrolled   . Unspecified gastritis and gastroduodenitis without mention of hemorrhage     Medications:  Infusions:  . heparin      Assessment: 55 yom presented to the ED with LVAD alarming. Pt is on chronic coumadin but INR is low at 1.24. Hgb is slightly low and  platelet are WNL. LDH is elevated.  Goal of Therapy:  Heparin level 0.3-0.7 units/ml Monitor platelets by anticoagulation protocol: Yes   Plan:  Heparin gtt 1400 units/hr  Check an 8 hr HL Daily HL and CBC  Daniah Zaldivar, Rande Lawman 02/28/2018,10:50 PM

## 2018-02-28 NOTE — ED Notes (Signed)
Pt resting with eyes closed, no complaints voiced.

## 2018-02-28 NOTE — ED Notes (Signed)
Return from CT.  Dr. Joesphine Bare at bedside

## 2018-02-28 NOTE — ED Notes (Signed)
LVAD alarm continues to alarm off and on.  Dr. Haroldine Laws continues to be at bedside.  Pt st's nausea has subsided at this time. Pt alert and oriented at this time

## 2018-02-28 NOTE — ED Provider Notes (Addendum)
Emergency Department Provider Note   I have reviewed the triage vital signs and the nursing notes.   HISTORY  Chief Complaint Nausea and Emesis   HPI [name] is a 52 y.o. male medical problems documented below but most importantly has an LVAD secondary to heart failure.  He does have nausea vomiting but more important has been having some alarms on his LVAD so came here for further evaluation.  Patient states his nausea vomiting is similar to previous episodes.  No blood.  No diarrhea or fevers.  No abdominal tenderness and a different than his normal. No other associated or modifying symptoms.    Past Medical History:  Diagnosis Date  . Angina   . ASCVD (arteriosclerotic cardiovascular disease)    , Anterior infarction 2005, LAD diagonal bifurcation intervention 03/2004  . Automatic implantable cardiac defibrillator -[employer]. Jude's       . Benign neoplasm of colon   . CHF (congestive heart failure) ([place])   . Chronic systolic heart failure ([place])   . Coronary artery disease     Widely patent previously placed stents in the left anterior   . Crohn's disease ([place])   . 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 Dialysis patient ([place])   . Dyspnea   . Gastroparesis   . GERD (gastroesophageal reflux disease)   . High cholesterol   . Hyperlipidemia   . Hypersomnolent    Previous diagnosis of narcolepsy  . Hypertension, essential   . Ischemic cardiomyopathy    Ejection fraction 15-20% catheterization 2010  . Type II or unspecified type diabetes mellitus without mention of complication, not stated as uncontrolled   . Unspecified gastritis and gastroduodenitis without mention of hemorrhage     Patient Active Problem List   Diagnosis Date Noted  . Left ventricular assist device (LVAD) complication 15/17/6160  . Left ventricular assist device (LVAD) complication, initial encounter 02/28/2018  . ESRD (end stage renal disease) ([place]) 12/11/2017  . Left  ventricular assist device present ([place]) 12/05/2017  . Chronic kidney disease with end stage renal failure on dialysis ([place]) 10/29/2017  . Nausea with vomiting 09/12/2017  . Intractable nausea and vomiting 07/31/2017  . 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 LVAD (left ventricular assist device) present ([place])   . Palliative care by specialist   . Chronic systolic heart failure ([place]) 06/07/2017  . ACP (advance care planning)   . Goals of care, counseling/discussion   . Diarrhea   . Palliative care encounter   . Nausea   . CHF exacerbation ([place]) 04/05/2017  . Diabetic keto-acidosis ([place]) 04/05/2017  . Type 2 diabetes mellitus with complication, with long-term current use of insulin ([place]) 03/08/2017  . Acute on chronic combined systolic and diastolic CHF (congestive heart failure) ([place]) 02/08/2017  . ESRD on dialysis ([place]) 01/17/2017  . AKI (acute kidney injury) ([place])   . Non-ST elevation (NSTEMI) myocardial infarction ([place])   . CHF (congestive heart failure) ([place]) 09/02/2016  . Elevated serum creatinine   . Encounter for therapeutic drug monitoring 08/03/2016  . Chest pain 04/26/2012  . SVT (supraventricular tachycardia) ([place]) 04/26/2012  . Diabetic gastroparesis ([place]) 11/18/2011  . Nausea and vomiting in adult 11/17/2011  . Coronary artery disease   . Ischemic cardiomyopathy   . Essential hypertension   . Automatic implantable cardioverter-defibrillator in situ   . Deep venous thrombosis ([place])   . Hypersomnolent   . POLYP, COLON 09/25/2008  . Type 2  diabetes mellitus ([place]) 09/25/2008  . Dyslipidemia, goal LDL below 70 09/25/2008  . GASTRITIS 09/25/2008  . DIVERTICULOSIS, COLON 09/25/2008  . RECTAL MASS 09/25/2008    Past Surgical History:  Procedure Laterality Date  . APPENDECTOMY    . AV FISTULA PLACEMENT Right 06/26/2017   Procedure: ARTERIOVENOUS (AV) FISTULA CREATION VERSUS GRAFT RIGHT  ARM;  Surgeon: Conrad Cassel, MD;  Location: [facility];   Service: Vascular;  Laterality: Right;  . AV FISTULA PLACEMENT Right 10/31/2017   Procedure: INSERTION OF ARTERIOVENOUS (AV) ARTEGRAFT,  RIGHT UPPER ARM;  Surgeon: Conrad Harbor View, MD;  Location: [place];  Service: Vascular;  Laterality: Right;  . CARDIAC DEFIBRILLATOR PLACEMENT  2010   St. Jude ICD  . COLECTOMY  ~ 2003   "for Crohn's" ascending colon.    . CORONARY STENT INTERVENTION N/A 11/25/2016   Procedure: Coronary Stent Intervention;  Surgeon: Leonie Man, MD;  Location: [place] CV LAB;  Service: Cardiovascular;  Laterality: N/A;  . EP IMPLANTABLE DEVICE N/A 09/14/2016   Procedure: ICD Generator Changeout;  Surgeon: Deboraha Sprang, MD;  Location: [place] [facility];  Service: Cardiovascular;  Laterality: N/A;  . ESOPHAGOGASTRODUODENOSCOPY N/A 07/12/2017   Procedure: ESOPHAGOGASTRODUODENOSCOPY (EGD);  Surgeon: Jerene Bears, MD;  [facility] ENDOSCOPY;  Service: Gastroenterology;  Laterality: N/A;  VAD pt so VAD team will need to accompany pt.   [name] FETAL SURGERY FOR CONGENITAL HERNIA     ????? pt has no knowledge of this..    . INGUINAL HERNIA REPAIR    . INSERTION OF DIALYSIS CATHETER Right 06/26/2017   Procedure: INSERTION OF TUNNELED  DIALYSIS CATHETER RIGHT INTERNAL JUGULAR;  Surgeon: Conrad Deer Lodge, MD;  Location: [place];  Service: Vascular;  Laterality: Right;  . INSERTION OF IMPLANTABLE LEFT VENTRICULAR ASSIST DEVICE N/A 06/12/2017   Procedure: INSERTION OF IMPLANTABLE LEFT VENTRICULAR ASSIST [place] 3;  Surgeon: Ivin Poot, MD;  Location: [place];  Service: Open Heart Surgery;  Laterality: N/A;  HM3 LVAD  CIRC ARREST  NITRIC OXIDE  . IR REMOVAL TUN CV CATH W/O FL  02/26/2018  . IR THORACENTESIS ASP PLEURAL SPACE W/IMG GUIDE  06/28/2017  . LIGATION OF ARTERIOVENOUS  FISTULA Right 10/31/2017   Procedure: LIGATION OF BRACHIO-BASILIC VEIN TRANSPOSITION RIGHT ARM;  Surgeon: Conrad Franklin, MD;  Location: [place];  Service: Vascular;  Laterality: Right;  . MULTIPLE  EXTRACTIONS WITH ALVEOLOPLASTY N/A 06/08/2017   Procedure: MULTIPLE EXTRACTION WITH ALVEOLOPLASTY AND PRE PROSTHETIC SURGERY AS NEEDED;  Surgeon: Lenn Cal, DDS;  Location: [place];  Service: Oral Surgery;  Laterality: N/A;  . RIGHT HEART CATH N/A 06/07/2017   Procedure: RIGHT HEART CATH;  Surgeon: Larey Dresser, MD;  Location: [place] [facility] LAB;  Service: Cardiovascular;  Laterality: N/A;  . RIGHT HEART CATH N/A 02/08/2018   Procedure: RIGHT HEART CATH;  Surgeon: Larey Dresser, MD;  Location: [place] [facility] LAB;  Service: Cardiovascular;  Laterality: N/A;  . RIGHT/LEFT HEART CATH AND CORONARY ANGIOGRAPHY N/A 11/23/2016   Procedure: Right/Left Heart Cath and Coronary Angiography;  Surgeon: Larey Dresser, MD;  Location: [place] [facility] LAB;  Service: Cardiovascular;  Laterality: N/A;  . TEE WITHOUT CARDIOVERSION N/A 06/12/2017   Procedure: TRANSESOPHAGEAL ECHOCARDIOGRAM (TEE);  Surgeon: Prescott Gum, Collier Salina, MD;  Location: [place];  Service: Open Heart Surgery;  Laterality: N/A;  . THROMBECTOMY AND REVISION OF ARTERIOVENTOUS (AV) GORETEX  GRAFT Right 12/12/2017   Procedure: THROMBECTOMY ARTERIOVENTOUS (AV) GORETEX  GRAFT RIGHT UPPER  ARM;  Surgeon: Conrad Kenton, MD;  Location: [place];  Service: Vascular;  Laterality: Right;    Current Outpatient Rx  . Order #: [id number] Class: No Print  . Order #: [id number] Class: No Print  . Order #: [id number] Class: Normal  . Order #: [id number] Class: Normal  . Order #: [id number] Class: Print  . Order #: [id number] Class: No Print  . Order #: [id number] Class: Sample  . Order #: [id number] Class: No Print  . Order #: [id number] Class: Print  . Order #: [id number] Class: Normal  . Order #: [id number] Class: Normal  . Order #: [id number] Class: Historical Med  . Order #: [id number] Class: Normal  . Order #: [id number] Class: Normal  . Order #: [id number] Class: Normal    Allergies Metformin and related  Family History  Problem Relation Age of Onset  . Colon polyps Mother    . Diabetes Mother   . Heart attack Father   . Diabetes Father   . Stroke Paternal Grandfather   . Colitis Sister   . Heart disease Brother   . Colitis Sister   . Colon cancer Neg Hx     Social History Social History   Tobacco Use  . Smoking status: Never Smoker  . Smokeless tobacco: Never Used  Substance Use Topics  . Alcohol use: No  . Drug use: No    Review of Systems  All other systems negative except as documented in the HPI. All pertinent positives and negatives as reviewed in the HPI. ____________________________________________   PHYSICAL EXAM:  VITAL SIGNS: ED Triage Vitals  Enc Vitals Group     BP 02/28/18 2040 134/80     Pulse Rate 02/28/18 2040 89     Resp --      Temp 02/28/18 2040 (!) 96.6 F (35.9 C)     Temp Source 02/28/18 2040 Temporal     SpO2 02/28/18 2040 98 %     Weight 02/28/18 2042 176 lb 5.9 oz (80 kg)     Height 02/28/18 2042 5' 7"  (1.702 m)    Constitutional: Alert and oriented. Well appearing and in no acute distress. Eyes: Conjunctivae are normal. PERRL. EOMI. Head: Atraumatic. Nose: No congestion/rhinnorhea. Mouth/Throat: Mucous membranes are dry.  Oropharynx non-erythematous. Neck: No stridor.  No meningeal signs.   Cardiovascular: Normal rate, regular rhythm. Good peripheral circulation. Grossly normal heart sounds.   Respiratory: Normal respiratory effort.  No retractions. Lungs CTAB. Gastrointestinal: Soft and nontender. No distention.  Musculoskeletal: No lower extremity tenderness nor edema. No gross deformities of extremities. Neurologic:  Normal speech and language. No gross focal neurologic deficits are appreciated.  Skin:  Skin is warm, dry and intact. No rash noted.  ____________________________________________   LABS (all labs ordered are listed, but only abnormal results are displayed)  Labs Reviewed  CBC WITH DIFFERENTIAL/PLATELET - Abnormal; Notable for the following components:      Result Value   WBC 16.5  (*)    Hemoglobin 12.3 (*)    RDW 15.8 (*)    Neutro Abs 14.8 (*)    All other components within normal limits  COMPREHENSIVE METABOLIC PANEL - Abnormal; Notable for the following components:   Sodium 133 (*)    Chloride 93 (*)    CO2 21 (*)    Glucose, Bld 150 (*)    Creatinine, Ser 3.98 (*)    Calcium 8.8 (*)    AST 46 (*)    ALT 49 (*)    Alkaline Phosphatase 127 (*)    Total Bilirubin 1.5 (*)  GFR calc non Af Amer 16 (*)    GFR calc Af Amer 18 (*)    Anion gap 19 (*)    All other components within normal limits  LACTATE DEHYDROGENASE - Abnormal; Notable for the following components:   LDH 280 (*)    All other components within normal limits  PROTIME-INR - Abnormal; Notable for the following components:   Prothrombin Time 15.5 (*)    All other components within normal limits  MRSA PCR SCREENING  LIPASE, BLOOD  HEPARIN LEVEL (UNFRACTIONATED)  CBC  PROTIME-INR  BASIC METABOLIC PANEL  TYPE AND SCREEN  PREPARE RBC (CROSSMATCH)   ____________________________________________  RADIOLOGY  Ct Chest W Contrast  Result Date: 02/28/2018 CLINICAL DATA:  52 year old male with concern for LVAD dysfunction. EXAM: CT CHEST WITH CONTRAST TECHNIQUE: Multidetector CT imaging of the chest was performed during intravenous contrast administration. CONTRAST:  40m OMNIPAQUE IOHEXOL 300 MG/ML  SOLN COMPARISON:  Chest radiograph dated 01/19/2018 and CT dated 06/27/2017 FINDINGS: Evaluation is limited due to streak artifact caused by pacemaker and LVAD hardware. Cardiovascular: There is moderate cardiomegaly. No pericardial effusion. Multi vessel coronary vascular calcification. Left pectoral pacemaker device is noted. There is a LVAD device. The inflow cannula is along the long axis of the left ventricle. The outflow cannula extends to the upper ascending aorta. Evaluation of the inflow cannula is limited due to severe streak artifacts. There is non opacification of the outflow cannula limiting  evaluation of lumen. However, underlying occlusion of the tract is not excluded. There may be a focal area of kinking of the outflow cannula in the anterior chest (sagittal series 6, image 68). Evaluation of this area is dated due to streak artifact. There is retrograde opacification of the distal outflow cannula at the junction of the SVC. Focal area of filling defect in the distal outflow cannula (series 3 images 59-63) may be related to mixing artifact, although a clot is not excluded. There is opacification of the aorta directly from the left ventricle. There is no aortic aneurysm or dissection. The origins of the great vessels of the aortic arch are patent. 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 The central pulmonary arteries are patent as visualized. Evaluation of the pulmonary artery branches is limited due to suboptimal opacification and timing of the contrast as well as streak artifact caused by LVAD. Mediastinum/Nodes: There is no hilar or mediastinal adenopathy. There is a small hiatal hernia. The esophagus and thyroid gland are grossly unremarkable. Lungs/Pleura: There are linear atelectasis/scarring. There is no focal consolidation, pleural effusion, or pneumothorax. The central airways are patent. Upper Abdomen: Probable mild fatty infiltration of the liver. The visualized upper abdomen is otherwise unremarkable. Musculoskeletal: Median sternotomy wires. No acute osseous pathology. IMPRESSION: 1. LVAD device with possible focal kinking of the outflow cannula. There is non opacification of the outflow cannula limiting the evaluation. Underlying thrombus or occlusion of the outflow cannula is not excluded. The aorta is perfused directly via the left ventricle. 2. Nonocclusive clot in the SVC. 3. Other findings as above. The LVAD findings were discussed in person with Dr. BHaroldine Lawsand the SVC clot was reported to Dr. MDayna Barkerby telephone at the time of interpretation on 02/28/2018 at 11:19 pm.  Electronically Signed   By: AAnner CreteM.D.   On: 02/28/2018 23:23    ____________________________________________   INITIAL IMPRESSION / ASSESSMENT AND PLAN / ED COURSE  CRITICAL CARE Performed by: MMerrily PewTotal critical care time: 35 minutes Critical care time  was exclusive of separately billable procedures and treating other patients. Critical care was necessary to treat or prevent imminent or life-threatening deterioration. Critical care was time spent personally by me on the following activities: development of treatment plan with patient and/or surrogate as well as nursing, discussions with consultants, evaluation of patient's response to treatment, examination of patient, obtaining history from patient or surrogate, ordering and performing treatments and interventions, ordering and review of laboratory studies, ordering and review of radiographic studies, pulse oximetry and re-evaluation of patient's condition.    Patient here and LVAD team here shortly after.  Found to have a kinked outflow tract and will go to the OR for that.  His nausea and vomiting seem to improve with Reglan and Phenergan.     Pertinent labs & imaging results that were available during my care of the patient were reviewed by me and considered in my medical decision making (see chart for details).  ____________________________________________  FINAL CLINICAL IMPRESSION(S) / ED DIAGNOSES  Final diagnoses:  Left ventricular assist device (LVAD) complication  LVAD (left ventricular assist device) present ([place])  Encounter for central line placement  Dyspnea  Complication involving left ventricular assist device (LVAD), subsequent encounter  Chronic systolic heart failure ([place])     MEDICATIONS GIVEN DURING THIS VISIT:  Medications  heparin ADULT infusion 100 units/mL (25000 units/262m sodium chloride 0.45%) (1,400 Units/hr Intravenous New Bag/Given 02/28/18 2250)  chlorhexidine (PERIDEX)  0.12 % solution 15 mL (has no administration in time range)  bisacodyl (DULCOLAX) EC tablet 5 mg (has no administration in time range)  mupirocin ointment (BACTROBAN) 2 % 1 application (has no administration in time range)  temazepam (RESTORIL) capsule 15 mg (has no administration in time range)  Chlorhexidine Gluconate Cloth 2 % PADS 6 each (has no administration in time range)  Chlorhexidine Gluconate Cloth 2 % PADS 6 each (has no administration in time range)  Chlorhexidine Gluconate Cloth 2 % PADS 6 each (has no administration in time range)  dexmedetomidine (PRECEDEX) 400 MCG/100ML (4 mcg/mL) infusion (has no administration in time range)  insulin regular (NOVOLIN R,HUMULIN R) 100 Units in sodium chloride 0.9 % 100 mL (1 Units/mL) infusion (has no administration in time range)  EPINEPHrine (ADRENALIN) 4 mg in dextrose 5 % 250 mL (0.016 mg/mL) infusion (has no administration in time range)  DOBUTamine (DOBUTREX) infusion 4000 mcg/mL (has no administration in time range)  DOPamine (INTROPIN) 800 mg in dextrose 5 % 250 mL (3.2 mg/mL) infusion (has no administration in time range)  milrinone (PRIMACOR) 20 MG/100 ML (0.2 mg/mL) infusion (has no administration in time range)  norepinephrine (LEVOPHED) 425min D5W 25032mremix infusion (has no administration in time range)  nitroGLYCERIN 50 mg in dextrose 5 % 250 mL (0.2 mg/mL) infusion (has no administration in time range)  phenylephrine (NEO-SYNEPHRINE) 20 mg in sodium chloride 0.9 % 250 mL (0.08 mg/mL) infusion (has no administration in time range)  vasopressin (PITRESSIN) 40 Units in sodium chloride 0.9 % 250 mL (0.16 Units/mL) infusion (has no administration in time range)  heparin 30,000 units/NS 1000 mL solution for CELLSAVER (has no administration in time range)  potassium chloride injection 80 mEq (has no administration in time range)  magnesium sulfate (IV Push/IM) injection 40 mEq (has no administration in time range)  tranexamic acid  (CYKLOKAPRON) pump prime solution 160 mg (has no administration in time range)  tranexamic acid (CYKLOKAPRON) bolus via infusion - over 30 minutes 1,200 mg (has no administration in time range)  tranexamic acid (  CYKLOKAPRON) 2,500 mg in sodium chloride 0.9 % 250 mL (10 mg/mL) infusion (has no administration in time range)  vancomycin (VANCOCIN) 1,250 mg in sodium chloride 0.9 % 250 mL IVPB (has no administration in time range)  cefUROXime (ZINACEF) 1.5 g in sodium chloride 0.9 % 100 mL IVPB (has no administration in time range)  cefUROXime (ZINACEF) 750 mg in sodium chloride 0.9 % 100 mL IVPB (has no administration in time range)  fluconazole (DIFLUCAN) IVPB 400 mg (has no administration in time range)  rifampin (RIFADIN) 600 mg in sodium chloride 0.9 % 100 mL IVPB (has no administration in time range)  vancomycin (VANCOCIN) powder 1,000 mg (has no administration in time range)  metoCLOPramide (REGLAN) injection 10 mg (10 mg Intravenous Given 02/28/18 2109)  sodium chloride 0.9 % bolus 250 mL (250 mLs Intravenous New Bag/Given 02/28/18 2108)  promethazine (PHENERGAN) injection 12.5 mg (12.5 mg Intravenous Given 02/28/18 2124)  sodium chloride 0.9 % bolus 750 mL (0 mLs Intravenous Stopped 02/28/18 2251)  iohexol (OMNIPAQUE) 300 MG/ML solution 75 mL (75 mLs Intravenous Contrast Given 02/28/18 2224)     NEW OUTPATIENT MEDICATIONS STARTED DURING THIS VISIT:  New Prescriptions   No medications on file    Note:  This note was prepared with assistance of Dragon voice recognition software. Occasional wrong-word or sound-a-like substitutions may have occurred due to the inherent limitations of voice recognition software.   Sola Margolis, Corene Cornea, MD 03/01/18 0012    Merrily Pew, MD 03/25/18 [phone]

## 2018-02-28 NOTE — H&P (Addendum)
Advanced Heart Failure VAD History and Physical Note   PCP-Cardiologist: No primary care provider on file.  HF/VAD: Dr Aundra Dubin  Reason for Admission:  LVAD complication with No flow alarm   HPI:    [name] is a 52 year old with a history of CAD s/p anterior MI in 2010 and NSTEMI in 3/18 with DES to pRCA and mLAD and ischemic cardiomyopathy now s/p Heartmate 3 LVAD and ESRD. HMIII VAD placed 06/2017.  AVF placed 10/2017 but unable to use. Temporary HD catheter in place.   GI Events -07/2017 Hospital admit twice in November for N/Vdiabetic gastroparesis. GI consulted. Placed on reglan and erythromycin -09/2017 Hospital admit for N/Vdiabetic gastroparesis.  Given phenergan suppositories and erythromycin -12/2017  Hospital admit for N/Vdiabetic gastroparesis.   VAD Drive Line Events: -04/1828>> poss drive line infection, CT ABD neg, ID consult-doxy -11/09/16>> admit for poss drive line infection, IV abx -03/24/17>> doxy for poss drive line infection -05/04/17>> drive line debridement with wound-vac -06/2017>> drive line +proteus, IV abx, Bactrim x14 days  Admitted last month with recurrent n/v due to gastroparesis. Treated with IV fluids and phenergan.   Had colongraphy at [facility] yesterday and was lying on side for awhile. Over last 24 hours had increased weakness and n/v. Has not taken coumadin for 2 days due to n/v. Went to HD today and didn't feel well. Unable to pull fluid. Around 630p LVAD began to alarm Low Flow/Noflow. EMS called and brought to ER. Patient weak but stable with MAPs 80-90s. No SOB. Initially unable to read data off controller. I switched controller and show Flow 0.1L. Pump sounded ok on exam. VAD coordinator at bedside and discussed with industry team. Patient felt to how outflow graft kink. I did echo at bedside and LV dilated with EF ~25-30%. Aortic valve opens 5/5. (on previous ramp 2/5). Stat CT reviewed personally with Radiology showed outflow graft kink with no flow  through graft. Case d/w Dr. Prescott Gum and plan to go emergently to OR. INR 1.24.  LVAD INTERROGATION:  HeartMate II LVAD:  Flow 0.1 liters/min, speed 5500, power 3.2, PI 1.9.     Review of Systems: [y] = yes, [ ]  = no   General: Weight gain [ ] ; Weight loss [ ] ; Anorexia [ Y]; Fatigue [ Y]; Fever [ ] ; Chills [ ] ; Weakness [Y ]  Cardiac: Chest pain/pressure [ ] ; Resting SOB [ ] ; Exertional SOB [ Y]; Orthopnea [ ] ; Pedal Edema [ ] ; Palpitations [ ] ; Syncope [ ] ; Presyncope [ ] ; Paroxysmal nocturnal dyspnea[ ]   Pulmonary: Cough [ ] ; Wheezing[ ] ; Hemoptysis[ ] ; Sputum [ ] ; Snoring [ ]   GI: Vomiting[Y ]; Dysphagia[ ] ; Melena[ ] ; Hematochezia [ ] ; Heartburn[ ] ; Abdominal pain [ ] ; Constipation [ ] ; Diarrhea [ ] ; BRBPR [ ]   GU: Hematuria[ ] ; Dysuria [ ] ; Nocturia[ ]   Vascular: Pain in legs with walking [ ] ; Pain in feet with lying flat [ ] ; Non-healing sores [ ] ; Stroke [ ] ; TIA [ ] ; Slurred speech [ ] ;  Neuro: Headaches[ ] ; Vertigo[ ] ; Seizures[ ] ; Paresthesias[ ] ;Blurred vision [ ] ; Diplopia [ ] ; Vision changes [ ]   Ortho/Skin: Arthritis [Y ]; Joint pain [ Y]; Muscle pain [ ] ; Joint swelling [ ] ; Back Pain [ ] ; Rash [ ]   Psych: Depression[Y ]; Anxiety[ ]   Heme: Bleeding problems [ ] ; Clotting disorders [ ] ; Anemia [ ]   Endocrine: Diabetes [Y ]; Thyroid dysfunction[ ]     Home Medications Prior to  Admission medications   Medication Sig Start Date End Date Taking? Authorizing Provider  amLODipine (NORVASC) 2.5 MG tablet Take 1 tablet (2.5 mg total) by mouth daily. 01/02/18 04/02/18  Larey Dresser, MD  aspirin EC 81 MG tablet Take 1 tablet (81 mg total) by mouth daily. 07/26/17   Larey Dresser, MD  atorvastatin (LIPITOR) 40 MG tablet Take 1 tablet (40 mg total) daily at 6 PM by mouth. 07/16/17   Bhagat, Bhavinkumar, PA  docusate sodium (COLACE) 100 MG capsule Take 2 capsules (200 mg total) by mouth daily. 07/03/17   Clegg, Amy D, NP  dronabinol (MARINOL) 2.5 MG capsule Take 1 capsule (2.5 mg  total) by mouth 2 (two) times daily before lunch and supper. 09/16/17   Daune Perch, NP  hydrALAZINE (APRESOLINE) 25 MG tablet Take 1.5 tablets (37.5 mg total) by mouth 3 (three) times daily. Take on non-hd days 10/24/17   Larey Dresser, MD  insulin aspart (NOVOLOG) 100 UNIT/ML injection Inject 3 Units into the skin 3 (three) times daily with meals. Patient taking differently: Inject 5 Units into the skin 3 (three) times daily with meals.  07/03/17   Clegg, Amy D, NP  insulin glargine (LANTUS) 100 UNIT/ML injection Inject 0.05 mLs (5 Units total) into the skin daily. Patient taking differently: Inject 10 Units into the skin daily.  07/03/17   Clegg, Amy D, NP  LORazepam (ATIVAN) 0.5 MG tablet Take 1 tablet (0.5 mg total) by mouth every 12 (twelve) hours as needed for anxiety or sleep. 08/04/17   Clegg, Amy D, NP  metoCLOPramide (REGLAN) 5 MG tablet Take 1 tablet (5 mg total) by mouth 4 (four) times daily -  before meals and at bedtime. Patient taking differently: Take 10 mg by mouth 3 (three) times daily.  08/04/17   Clegg, Amy D, NP  ondansetron (ZOFRAN ODT) 4 MG disintegrating tablet Take 1 tablet (4 mg total) by mouth every 6 (six) hours as needed for nausea or vomiting. 01/10/17   Esterwood, Amy S, PA-C  pantoprazole (PROTONIX) 40 MG tablet Take 40 mg by mouth daily.    [provider]  promethazine (PHENERGAN) 12.5 MG tablet Take 1 tablet (12.5 mg total) by mouth every 6 (six) hours as needed for nausea or vomiting. Patient taking differently: Take 25 mg by mouth every 6 (six) hours as needed for nausea or vomiting.  09/16/17   Daune Perch, NP  sildenafil (REVATIO) 20 MG tablet Take 1 tablet (20 mg total) by mouth 3 (three) times daily. 07/26/17   Larey Dresser, MD  warfarin (COUMADIN) 5 MG tablet Take 5 mg (1 tablet) daily except 7.5 mg (1 and 1/2 tablets) on Tuesday and Thursday    [provider]    Past Medical History: Past Medical History:  Diagnosis Date  .  Angina   . ASCVD (arteriosclerotic cardiovascular disease)    , Anterior infarction 2005, LAD diagonal bifurcation intervention 03/2004  . Automatic implantable cardiac defibrillator -St. Jude's       . Benign neoplasm of colon   . CHF (congestive heart failure) ([place])   . Chronic systolic heart failure ([place])   . Coronary artery disease     Widely patent previously placed stents in the left anterior   . Crohn's disease ([place])   . 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 Dialysis patient ([place])   . Dyspnea   . Gastroparesis   . GERD (gastroesophageal reflux disease)   . High  cholesterol   . Hyperlipidemia   . Hypersomnolent    Previous diagnosis of narcolepsy  . Hypertension, essential   . Ischemic cardiomyopathy    Ejection fraction 15-20% catheterization 2010  . Type II or unspecified type diabetes mellitus without mention of complication, not stated as uncontrolled   . Unspecified gastritis and gastroduodenitis without mention of hemorrhage     Past Surgical History: Past Surgical History:  Procedure Laterality Date  . APPENDECTOMY    . AV FISTULA PLACEMENT Right 06/26/2017   Procedure: ARTERIOVENOUS (AV) FISTULA CREATION VERSUS GRAFT RIGHT  ARM;  Surgeon: Conrad Buffalo, MD;  Location: [facility] OR;  Service: Vascular;  Laterality: Right;  . AV FISTULA PLACEMENT Right 10/31/2017   Procedure: INSERTION OF ARTERIOVENOUS (AV) ARTEGRAFT,  RIGHT UPPER ARM;  Surgeon: Conrad W[place] River, MD;  Location: [place];  Service: Vascular;  Laterality: Right;  . CARDIAC DEFIBRILLATOR PLACEMENT  2010   St. Jude ICD  . COLECTOMY  ~ 2003   "for Crohn's" ascending colon.    . CORONARY STENT INTERVENTION N/A 11/25/2016   Procedure: Coronary Stent Intervention;  Surgeon: Leonie Man, MD;  Location: [place] CV LAB;  Service: Cardiovascular;  Laterality: N/A;  . EP IMPLANTABLE DEVICE N/A 09/14/2016   Procedure: ICD Generator Changeout;  Surgeon: Deboraha Sprang, MD;  Location: [place] CV  LAB;  Service: Cardiovascular;  Laterality: N/A;  . ESOPHAGOGASTRODUODENOSCOPY N/A 07/12/2017   Procedure: ESOPHAGOGASTRODUODENOSCOPY (EGD);  Surgeon: Jerene Bears, MD;  Location[facility] ENDOSCOPY;  Service: Gastroenterology;  Laterality: N/A;  VAD pt so VAD team will need to accompany pt.   [name] FETAL SURGERY FOR CONGENITAL HERNIA     ????? pt has no knowledge of this..    . INGUINAL HERNIA REPAIR    . INSERTION OF DIALYSIS CATHETER Right 06/26/2017   Procedure: INSERTION OF TUNNELED  DIALYSIS CATHETER RIGHT INTERNAL JUGULAR;  Surgeon: Conrad [place] Lake, MD;  Location: [place];  Service: Vascular;  Laterality: Right;  . INSERTION OF IMPLANTABLE LEFT VENTRICULAR ASSIST DEVICE N/A 06/12/2017   Procedure: INSERTION OF IMPLANTABLE LEFT VENTRICULAR ASSIST [place] 3;  Surgeon: Ivin Poot, MD;  Location: [place];  Service: Open Heart Surgery;  Laterality: N/A;  HM3 LVAD  CIRC ARREST  NITRIC OXIDE  . IR REMOVAL TUN CV CATH W/O FL  02/26/2018  . IR THORACENTESIS ASP PLEURAL SPACE W/IMG GUIDE  06/28/2017  . LIGATION OF ARTERIOVENOUS  FISTULA Right 10/31/2017   Procedure: LIGATION OF BRACHIO-BASILIC VEIN TRANSPOSITION RIGHT ARM;  Surgeon: Conrad Van Wert, MD;  Location: [place];  Service: Vascular;  Laterality: Right;  . MULTIPLE EXTRACTIONS WITH ALVEOLOPLASTY N/A 06/08/2017   Procedure: MULTIPLE EXTRACTION WITH ALVEOLOPLASTY AND PRE PROSTHETIC SURGERY AS NEEDED;  Surgeon: Lenn Cal, DDS;  Location: [place];  Service: Oral Surgery;  Laterality: N/A;  . RIGHT HEART CATH N/A 06/07/2017   Procedure: RIGHT HEART CATH;  Surgeon: Larey Dresser, MD;  Location: [place] CV LAB;  Service: Cardiovascular;  Laterality: N/A;  . RIGHT HEART CATH N/A 02/08/2018   Procedure: RIGHT HEART CATH;  Surgeon: Larey Dresser, MD;  Location: [place] CV LAB;  Service: Cardiovascular;  Laterality: N/A;  . RIGHT/LEFT HEART CATH AND CORONARY ANGIOGRAPHY N/A 11/23/2016   Procedure: Right/Left Heart Cath and Coronary  Angiography;  Surgeon: Larey Dresser, MD;  Location: [place] CV LAB;  Service: Cardiovascular;  Laterality: N/A;  . TEE WITHOUT CARDIOVERSION N/A 06/12/2017   Procedure: TRANSESOPHAGEAL ECHOCARDIOGRAM (TEE);  Surgeon: Lucianne Lei  Donney Rankins, MD;  Location: [place];  Service: Open Heart Surgery;  Laterality: N/A;  . THROMBECTOMY AND REVISION OF ARTERIOVENTOUS (AV) GORETEX  GRAFT Right 12/12/2017   Procedure: THROMBECTOMY ARTERIOVENTOUS (AV) GORETEX  GRAFT RIGHT UPPER ARM;  Surgeon: Conrad Windham, MD;  Location: [facility] OR;  Service: Vascular;  Laterality: Right;    Family History: Family History  Problem Relation Age of Onset  . Colon polyps Mother   . Diabetes Mother   . Heart attack Father   . Diabetes Father   . Stroke Paternal Grandfather   . Colitis Sister   . Heart disease Brother   . Colitis Sister   . Colon cancer Neg Hx     Social History: Social History   Socioeconomic History  . Marital status: Married    Spouse name: Not on file  . Number of children: Not on file  . Years of education: Not on file  . Highest education level: Not on file  Occupational History  . Not on file  Social Needs  . Financial resource strain: Not on file  . Food insecurity:    Worry: Not on file    Inability: Not on file  . Transportation needs:    Medical: Not on file    Non-medical: Not on file  Tobacco Use  . Smoking status: Never Smoker  . Smokeless tobacco: Never Used  Substance and Sexual Activity  . Alcohol use: No  . Drug use: No  . Sexual activity: Never  Lifestyle  . Physical activity:    Days per week: Not on file    Minutes per session: Not on file  . Stress: Not on file  Relationships  . Social connections:    Talks on phone: Not on file    Gets together: Not on file    Attends religious service: Not on file    Active member of club or organization: Not on file    Attends meetings of clubs or organizations: Not on file    Relationship status: Not on file  Other Topics  Concern  . Not on file  Social History Narrative  . Not on file    Allergies:  Allergies  Allergen Reactions  . Metformin And Related Diarrhea    Objective:    Vital Signs:   Temp:  [96.6 F (35.9 C)] 96.6 F (35.9 C) (06/26 2040) Pulse Rate:  [89-102] 100 (06/26 2200) Resp:  [13-25] 19 (06/26 2200) BP: (121-139)/(79-85) 127/85 (06/26 2200) SpO2:  [94 %-98 %] 96 % (06/26 2200) Weight:  [80 kg (176 lb 5.9 oz)] 80 kg (176 lb 5.9 oz) (06/26 2042)   Filed Weights   02/28/18 2042  Weight: 80 kg (176 lb 5.9 oz)    Mean arterial Pressure 80-90s  Physical Exam    General:  Weak appearing.  NAD.  HEENT: normal  Neck: supple. JVP 8-9  Carotids 2+ bilat; no bruits. No lymphadenopathy or thryomegaly appreciated. Cor: LVAD hum ok Lungs: Clear. Abdomen:  soft, nontender, non-distended. No hepatosplenomegaly. No bruits or masses. Good bowel sounds. Driveline site clean. Anchor in place.  Extremities: no cyanosis, clubbing, rash. Warm no edema  RUE AVF Neuro: alert & oriented x 3. No focal deficits. Moves all 4 without problem   Telemetry   Sinus 100-105 Personally reviewed   EKG   Pending  Labs    Basic Metabolic Panel: Recent Labs  Lab 02/28/18 2108  NA 133*  K 4.4  CL 93*  CO2 21*  GLUCOSE 150*  BUN 17  CREATININE 3.98*  CALCIUM 8.8*    Liver Function Tests: Recent Labs  Lab 02/28/18 2108  AST 46*  ALT 49*  ALKPHOS 127*  BILITOT 1.5*  PROT 8.1  ALBUMIN 3.8   Recent Labs  Lab 02/28/18 2108  LIPASE 21   No results for input(s): AMMONIA in the last 168 hours.  CBC: Recent Labs  Lab 02/28/18 2108  WBC 16.5*  NEUTROABS 14.8*  HGB 12.3*  HCT 39.4  MCV 88.3  PLT 191    Cardiac Enzymes: No results for input(s): CKTOTAL, CKMB, CKMBINDEX, TROPONINI in the last 168 hours.  BNP: BNP (last 3 results) Recent Labs    06/18/17 2305 06/26/17 0004 07/03/17 0037  BNP 746.0* 1,409.3* 758.9*    ProBNP (last 3 results) No results for  input(s): PROBNP in the last 8760 hours.   CBG: No results for input(s): GLUCAP in the last 168 hours.  Coagulation Studies: Recent Labs    02/28/18 2137/11/28  LABPROT 15.5*  INR 1.24    Other results:   Imaging    No results found.    Patient Profile:   [name] is a 51 year old with a history of CAD and ischemic cardiomyopathy now s/p Heartmate 3 LVAD on 10/18. Post VAD course complicated by ESRD and severe DM gastroparesis. Admitted with low/no-flow alarms due to LVAD outflow graft kink (does not have bend clip).    Assessment/Plan:    1. LVAD complication with LVAD outflow graft kink - INR 1.24, Started on heparin - Echo done at bedside EF ~30% with aortic valve opening 5/5 - CT reviewed with Radiology confirms bend kink - discussed with Dr. Prescott Gum who will take him to OR tonight - VAD interrogated personally. Controller changed as above.   2. Intractable Nausea/Vomiting secondary to gastroparesis - Continue reglan, phenergan, and ativan.  - Continue erythromycin 250 mg three times a day for gastroparesis. He has been seen by Dr Derrill Kay on 01/24/18 [facility] Gastroparesis Specialist)  3. ESRD - Had HD today but unable to remove fluid. Volume status  - Will let Renal know he is here in am   4. Chronic Systolic Heart Failure, HMIII 28-Nov-2016 - Hemodynamically stable despite no VAD flow. Can start vasopressors as needed.  - To OR tonight   5. DM,insulin dependent - SSI for  Now   CRITICAL CARE Performed by: [name]  Total critical care time: 120 minutes  Critical care time was exclusive of separately billable procedures and treating other patients.  Critical care was necessary to treat or prevent imminent or life-threatening deterioration.  Critical care was time spent personally by me (independent of midlevel providers or residents) on the following activities: development of treatment plan with patient and/or surrogate as well as nursing, discussions with  consultants, evaluation of patient's response to treatment, examination of patient, obtaining history from patient or surrogate, ordering and performing treatments and interventions, ordering and review of laboratory studies, ordering and review of radiographic studies, pulse oximetry and re-evaluation of patient's condition.  .  Length of Stay: 0  [name], MD 02/28/2018, 11:09 PM  VAD Team Pager [phone] (7am - 7am) +++VAD ISSUES ONLY+++   Advanced Heart Failure Team Pager [phone] (M-F; [place])  Please contact [facility] for night-coverage after hours (4p -7a ) and weekends on [email or web address] for all non- LVAD Issues

## 2018-02-28 NOTE — Significant Event (Signed)
Rapid Response Event Note  Called by ED Charge RN at 2057 about EMS bringing in a patient with a low flow alarm. I arrived in the ED with the VAD equipment.   Per patient and EMS, low flow alarm started around 6:30. Patient had just come from HD but no fluid was removed. He endorsed ongoing fatigue coupled with N/V (hx of gastroparesis). VS stable, manual and cuff pressures correlated, HR in the 90s, sats and RR were normal.  VAD Coordinator was paged by me and Dr. Haroldine Laws was called by EDP. Both arrived to evaluate patient. Controller was changed out as well as system units. I did initiate 1L NS bolus per MD, patient was Reglan/Phenergan for N/V, and taken for STAT CHEST CT. Labs were drawn on arrival.  CT- ? Outflow cannula kink, CT called, plan to take patient emergently to the OR for repair.   I was called away to an emergency at 2303.  Call Time [place] Time 2000 End Time 2303

## 2018-03-01 ENCOUNTER — Encounter (HOSPITAL_COMMUNITY): Payer: Self-pay | Admitting: Anesthesiology

## 2018-03-01 ENCOUNTER — Other Ambulatory Visit: Payer: Self-pay

## 2018-03-01 ENCOUNTER — Inpatient Hospital Stay (HOSPITAL_COMMUNITY): Payer: BLUE CROSS/BLUE SHIELD

## 2018-03-01 ENCOUNTER — Inpatient Hospital Stay (HOSPITAL_COMMUNITY): Payer: BLUE CROSS/BLUE SHIELD | Admitting: Anesthesiology

## 2018-03-01 DIAGNOSIS — Z992 Dependence on renal dialysis: Secondary | ICD-10-CM

## 2018-03-01 DIAGNOSIS — T82867A Thrombosis of cardiac prosthetic devices, implants and grafts, initial encounter: Secondary | ICD-10-CM

## 2018-03-01 DIAGNOSIS — I255 Ischemic cardiomyopathy: Secondary | ICD-10-CM

## 2018-03-01 DIAGNOSIS — N186 End stage renal disease: Secondary | ICD-10-CM

## 2018-03-01 DIAGNOSIS — R570 Cardiogenic shock: Secondary | ICD-10-CM

## 2018-03-01 DIAGNOSIS — Z95811 Presence of heart assist device: Secondary | ICD-10-CM

## 2018-03-01 LAB — BASIC METABOLIC PANEL
ANION GAP: 19 — AB (ref 5–15)
Anion gap: 12 (ref 5–15)
BUN: 18 mg/dL (ref 6–20)
BUN: 19 mg/dL (ref 6–20)
CALCIUM: 8.4 mg/dL — AB (ref 8.9–10.3)
CHLORIDE: 95 mmol/L — AB (ref 98–111)
CO2: 20 mmol/L — ABNORMAL LOW (ref 22–32)
CO2: 21 mmol/L — ABNORMAL LOW (ref 22–32)
Calcium: 7.2 mg/dL — ABNORMAL LOW (ref 8.9–10.3)
Chloride: 106 mmol/L (ref 98–111)
Creatinine, Ser: 4.31 mg/dL — ABNORMAL HIGH (ref 0.61–1.24)
Creatinine, Ser: 3.49 mg/dL — ABNORMAL HIGH (ref 0.61–1.24)
GFR calc Af Amer: 17 mL/min — ABNORMAL LOW (ref >60)
GFR calc Af Amer: 21 mL/min — ABNORMAL LOW (ref >60)
GFR calc non Af Amer: 19 mL/min — ABNORMAL LOW (ref >60)
GFR, EST NON AFRICAN AMERICAN: 14 mL/min — AB (ref >60)
GLUCOSE: 187 mg/dL — AB (ref 70–99)
Glucose, Bld: 114 mg/dL — ABNORMAL HIGH (ref 70–99)
POTASSIUM: 4.6 mmol/L (ref 3.5–5.1)
Potassium: 3.4 mmol/L — ABNORMAL LOW (ref 3.5–5.1)
Sodium: 134 mmol/L — ABNORMAL LOW (ref 135–145)
Sodium: 139 mmol/L (ref 135–145)

## 2018-03-01 LAB — POCT I-STAT 3, ART BLOOD GAS (G3+)
ACID-BASE DEFICIT: 2 mmol/L (ref 0.0–2.0)
Acid-Base Excess: 2 mmol/L (ref 0.0–2.0)
Acid-base deficit: 2 mmol/L (ref 0.0–2.0)
Acid-base deficit: 3 mmol/L — ABNORMAL HIGH (ref 0.0–2.0)
BICARBONATE: 23 mmol/L (ref 20.0–28.0)
Bicarbonate: 24.3 mmol/L (ref 20.0–28.0)
Bicarbonate: 25.5 mmol/L (ref 20.0–28.0)
Bicarbonate: 22.1 mmol/L (ref 20.0–28.0)
Bicarbonate: 21.6 mmol/L (ref 20.0–28.0)
O2 SAT: 100 %
O2 SAT: 100 %
O2 SAT: 100 %
O2 Saturation: 100 %
O2 Saturation: 100 %
PCO2 ART: 33.6 mmHg (ref 32.0–48.0)
PCO2 ART: 31.1 mmHg — AB (ref 32.0–48.0)
PCO2 ART: 34.7 mmHg (ref 32.0–48.0)
PH ART: 7.451 — AB (ref 7.350–7.450)
PH ART: 7.489 — AB (ref 7.350–7.450)
PH ART: 7.451 — AB (ref 7.350–7.450)
PH ART: 7.402 (ref 7.350–7.450)
PO2 ART: 508 mmHg — AB (ref 83.0–108.0)
PO2 ART: 464 mmHg — AB (ref 83.0–108.0)
PO2 ART: 166 mmHg — AB (ref 83.0–108.0)
Patient temperature: 34.8
TCO2: 24 mmol/L (ref 22–32)
TCO2: 25 mmol/L (ref 22–32)
TCO2: 27 mmol/L (ref 22–32)
TCO2: 23 mmol/L (ref 22–32)
TCO2: 23 mmol/L (ref 22–32)
pCO2 arterial: 37 mmHg (ref 32.0–48.0)
pCO2 arterial: 34.8 mmHg (ref 32.0–48.0)
pH, Arterial: 7.401 (ref 7.350–7.450)
pO2, Arterial: 394 mmHg — ABNORMAL HIGH (ref 83.0–108.0)
pO2, Arterial: 238 mmHg — ABNORMAL HIGH (ref 83.0–108.0)

## 2018-03-01 LAB — POCT I-STAT 7, (LYTES, BLD GAS, ICA,H+H)
Acid-base deficit: 2 mmol/L (ref 0.0–2.0)
Bicarbonate: 23.1 mmol/L (ref 20.0–28.0)
Calcium, Ion: 0.84 mmol/L — CL (ref 1.15–1.40)
HEMATOCRIT: 29 % — AB (ref 39.0–52.0)
HEMOGLOBIN: 9.9 g/dL — AB (ref 13.0–17.0)
O2 Saturation: 100 %
PCO2 ART: 36.4 mmHg (ref 32.0–48.0)
PO2 ART: 401 mmHg — AB (ref 83.0–108.0)
POTASSIUM: 3.6 mmol/L (ref 3.5–5.1)
Patient temperature: 36.1
Sodium: 140 mmol/L (ref 135–145)
TCO2: 24 mmol/L (ref 22–32)
pH, Arterial: 7.407 (ref 7.350–7.450)

## 2018-03-01 LAB — GLUCOSE, CAPILLARY
GLUCOSE-CAPILLARY: 120 mg/dL — AB (ref 70–99)
GLUCOSE-CAPILLARY: 121 mg/dL — AB (ref 70–99)
GLUCOSE-CAPILLARY: 132 mg/dL — AB (ref 70–99)
GLUCOSE-CAPILLARY: 123 mg/dL — AB (ref 70–99)
GLUCOSE-CAPILLARY: 107 mg/dL — AB (ref 70–99)
GLUCOSE-CAPILLARY: 108 mg/dL — AB (ref 70–99)
Glucose-Capillary: 120 mg/dL — ABNORMAL HIGH (ref 70–99)
Glucose-Capillary: 109 mg/dL — ABNORMAL HIGH (ref 70–99)
Glucose-Capillary: 102 mg/dL — ABNORMAL HIGH (ref 70–99)
Glucose-Capillary: 85 mg/dL (ref 70–99)
Glucose-Capillary: 90 mg/dL (ref 70–99)
Glucose-Capillary: 99 mg/dL (ref 70–99)
Glucose-Capillary: 112 mg/dL — ABNORMAL HIGH (ref 70–99)

## 2018-03-01 LAB — POCT I-STAT, CHEM 8
BUN: 19 mg/dL (ref 6–20)
BUN: 19 mg/dL (ref 6–20)
BUN: 20 mg/dL (ref 6–20)
BUN: 20 mg/dL (ref 6–20)
BUN: 23 mg/dL — ABNORMAL HIGH (ref 6–20)
BUN: 23 mg/dL — AB (ref 6–20)
BUN: 20 mg/dL (ref 6–20)
CALCIUM ION: 1.01 mmol/L — AB (ref 1.15–1.40)
CALCIUM ION: 1.27 mmol/L (ref 1.15–1.40)
CALCIUM ION: 1.35 mmol/L (ref 1.15–1.40)
CALCIUM ION: 1.17 mmol/L (ref 1.15–1.40)
CHLORIDE: 98 mmol/L (ref 98–111)
CREATININE: 3.7 mg/dL — AB (ref 0.61–1.24)
CREATININE: 3.6 mg/dL — AB (ref 0.61–1.24)
CREATININE: 3.7 mg/dL — AB (ref 0.61–1.24)
Calcium, Ion: 1.09 mmol/L — ABNORMAL LOW (ref 1.15–1.40)
Calcium, Ion: 0.99 mmol/L — ABNORMAL LOW (ref 1.15–1.40)
Calcium, Ion: 0.9 mmol/L — ABNORMAL LOW (ref 1.15–1.40)
Chloride: 96 mmol/L — ABNORMAL LOW (ref 98–111)
Chloride: 99 mmol/L (ref 98–111)
Chloride: 101 mmol/L (ref 98–111)
Chloride: 102 mmol/L (ref 98–111)
Chloride: 101 mmol/L (ref 98–111)
Chloride: 103 mmol/L (ref 98–111)
Creatinine, Ser: 3.9 mg/dL — ABNORMAL HIGH (ref 0.61–1.24)
Creatinine, Ser: 3.1 mg/dL — ABNORMAL HIGH (ref 0.61–1.24)
Creatinine, Ser: 3.3 mg/dL — ABNORMAL HIGH (ref 0.61–1.24)
Creatinine, Ser: 3.7 mg/dL — ABNORMAL HIGH (ref 0.61–1.24)
GLUCOSE: 181 mg/dL — AB (ref 70–99)
GLUCOSE: 139 mg/dL — AB (ref 70–99)
GLUCOSE: 148 mg/dL — AB (ref 70–99)
Glucose, Bld: 186 mg/dL — ABNORMAL HIGH (ref 70–99)
Glucose, Bld: 130 mg/dL — ABNORMAL HIGH (ref 70–99)
Glucose, Bld: 122 mg/dL — ABNORMAL HIGH (ref 70–99)
Glucose, Bld: 113 mg/dL — ABNORMAL HIGH (ref 70–99)
HCT: 29 % — ABNORMAL LOW (ref 39.0–52.0)
HCT: 29 % — ABNORMAL LOW (ref 39.0–52.0)
HCT: 30 % — ABNORMAL LOW (ref 39.0–52.0)
HCT: 26 % — ABNORMAL LOW (ref 39.0–52.0)
HEMATOCRIT: 32 % — AB (ref 39.0–52.0)
HEMATOCRIT: 26 % — AB (ref 39.0–52.0)
HEMATOCRIT: 32 % — AB (ref 39.0–52.0)
HEMOGLOBIN: 10.2 g/dL — AB (ref 13.0–17.0)
HEMOGLOBIN: 9.9 g/dL — AB (ref 13.0–17.0)
HEMOGLOBIN: 8.8 g/dL — AB (ref 13.0–17.0)
HEMOGLOBIN: 10.9 g/dL — AB (ref 13.0–17.0)
Hemoglobin: 10.9 g/dL — ABNORMAL LOW (ref 13.0–17.0)
Hemoglobin: 9.9 g/dL — ABNORMAL LOW (ref 13.0–17.0)
Hemoglobin: 8.8 g/dL — ABNORMAL LOW (ref 13.0–17.0)
POTASSIUM: 4.4 mmol/L (ref 3.5–5.1)
Potassium: 5 mmol/L (ref 3.5–5.1)
Potassium: 4.4 mmol/L (ref 3.5–5.1)
Potassium: 4.1 mmol/L (ref 3.5–5.1)
Potassium: 4.3 mmol/L (ref 3.5–5.1)
Potassium: 3.8 mmol/L (ref 3.5–5.1)
Potassium: 3.7 mmol/L (ref 3.5–5.1)
SODIUM: 135 mmol/L (ref 135–145)
SODIUM: 138 mmol/L (ref 135–145)
SODIUM: 139 mmol/L (ref 135–145)
Sodium: 133 mmol/L — ABNORMAL LOW (ref 135–145)
Sodium: 135 mmol/L (ref 135–145)
Sodium: 136 mmol/L (ref 135–145)
Sodium: 139 mmol/L (ref 135–145)
TCO2: 25 mmol/L (ref 22–32)
TCO2: 24 mmol/L (ref 22–32)
TCO2: 24 mmol/L (ref 22–32)
TCO2: 25 mmol/L (ref 22–32)
TCO2: 27 mmol/L (ref 22–32)
TCO2: 27 mmol/L (ref 22–32)
TCO2: 22 mmol/L (ref 22–32)

## 2018-03-01 LAB — POCT I-STAT EG7
ACID-BASE DEFICIT: 2 mmol/L (ref 0.0–2.0)
Bicarbonate: 23.1 mmol/L (ref 20.0–28.0)
Calcium, Ion: 0.9 mmol/L — ABNORMAL LOW (ref 1.15–1.40)
HEMATOCRIT: 29 % — AB (ref 39.0–52.0)
HEMOGLOBIN: 9.9 g/dL — AB (ref 13.0–17.0)
O2 SAT: 80 %
PH VEN: 7.366 (ref 7.250–7.430)
POTASSIUM: 3.5 mmol/L (ref 3.5–5.1)
Patient temperature: 36
SODIUM: 141 mmol/L (ref 135–145)
TCO2: 24 mmol/L (ref 22–32)
pCO2, Ven: 39.8 mmHg — ABNORMAL LOW (ref 44.0–60.0)
pO2, Ven: 44 mmHg (ref 32.0–45.0)

## 2018-03-01 LAB — CBC
HCT: 34.8 % — ABNORMAL LOW (ref 39.0–52.0)
HCT: 32.4 % — ABNORMAL LOW (ref 39.0–52.0)
HCT: 33.2 % — ABNORMAL LOW (ref 39.0–52.0)
Hemoglobin: 11.1 g/dL — ABNORMAL LOW (ref 13.0–17.0)
Hemoglobin: 10.9 g/dL — ABNORMAL LOW (ref 13.0–17.0)
Hemoglobin: 11.1 g/dL — ABNORMAL LOW (ref 13.0–17.0)
MCH: 27.8 pg (ref 26.0–34.0)
MCH: 29.2 pg (ref 26.0–34.0)
MCH: 29.1 pg (ref 26.0–34.0)
MCHC: 31.9 g/dL (ref 30.0–36.0)
MCHC: 33.4 g/dL (ref 30.0–36.0)
MCHC: 33.6 g/dL (ref 30.0–36.0)
MCV: 87.2 fL (ref 78.0–100.0)
MCV: 86.9 fL (ref 78.0–100.0)
MCV: 87.1 fL (ref 78.0–100.0)
PLATELETS: 198 10*3/uL (ref 150–400)
Platelets: 120 10*3/uL — ABNORMAL LOW (ref 150–400)
Platelets: 140 10*3/uL — ABNORMAL LOW (ref 150–400)
RBC: 3.99 MIL/uL — ABNORMAL LOW (ref 4.22–5.81)
RBC: 3.73 MIL/uL — ABNORMAL LOW (ref 4.22–5.81)
RBC: 3.81 MIL/uL — ABNORMAL LOW (ref 4.22–5.81)
RDW: 15.6 % — ABNORMAL HIGH (ref 11.5–15.5)
RDW: 15.7 % — ABNORMAL HIGH (ref 11.5–15.5)
RDW: 15.9 % — AB (ref 11.5–15.5)
WBC: 14.9 10*3/uL — ABNORMAL HIGH (ref 4.0–10.5)
WBC: 19.8 10*3/uL — ABNORMAL HIGH (ref 4.0–10.5)
WBC: 15.7 10*3/uL — ABNORMAL HIGH (ref 4.0–10.5)

## 2018-03-01 LAB — POCT I-STAT 4, (NA,K, GLUC, HGB,HCT)
Glucose, Bld: 118 mg/dL — ABNORMAL HIGH (ref 70–99)
HCT: 30 % — ABNORMAL LOW (ref 39.0–52.0)
Hemoglobin: 10.2 g/dL — ABNORMAL LOW (ref 13.0–17.0)
Potassium: 3.3 mmol/L — ABNORMAL LOW (ref 3.5–5.1)
Sodium: 140 mmol/L (ref 135–145)

## 2018-03-01 LAB — DIC (DISSEMINATED INTRAVASCULAR COAGULATION)PANEL
D-Dimer, Quant: 2.73 ug/mL-FEU — ABNORMAL HIGH (ref 0.00–0.50)
Fibrinogen: 283 mg/dL (ref 210–475)
INR: 1.67
Platelets: 120 10*3/uL — ABNORMAL LOW (ref 150–400)
Prothrombin Time: 19.6 seconds — ABNORMAL HIGH (ref 11.4–15.2)
Smear Review: NONE SEEN
aPTT: 36 seconds (ref 24–36)

## 2018-03-01 LAB — CREATININE, SERUM
Creatinine, Ser: 3.82 mg/dL — ABNORMAL HIGH (ref 0.61–1.24)
GFR calc Af Amer: 19 mL/min — ABNORMAL LOW (ref >60)
GFR calc non Af Amer: 17 mL/min — ABNORMAL LOW (ref >60)

## 2018-03-01 LAB — LACTATE DEHYDROGENASE: LDH: 292 U/L — AB (ref 98–192)

## 2018-03-01 LAB — COOXEMETRY PANEL
Carboxyhemoglobin: 0 % — ABNORMAL LOW (ref 0.5–1.5)
Methemoglobin: 2 % — ABNORMAL HIGH (ref 0.0–1.5)
O2 Saturation: 71 %
Total hemoglobin: 11.3 g/dL — ABNORMAL LOW (ref 12.0–16.0)

## 2018-03-01 LAB — MAGNESIUM
Magnesium: 1.6 mg/dL — ABNORMAL LOW (ref 1.7–2.4)
Magnesium: 1.5 mg/dL — ABNORMAL LOW (ref 1.7–2.4)

## 2018-03-01 LAB — HEMOGLOBIN AND HEMATOCRIT, BLOOD
HCT: 26.5 % — ABNORMAL LOW (ref 39.0–52.0)
Hemoglobin: 8.9 g/dL — ABNORMAL LOW (ref 13.0–17.0)

## 2018-03-01 LAB — PREPARE RBC (CROSSMATCH)

## 2018-03-01 LAB — PLATELET COUNT: Platelets: 74 10*3/uL — ABNORMAL LOW (ref 150–400)

## 2018-03-01 LAB — MRSA PCR SCREENING: MRSA BY PCR: NEGATIVE

## 2018-03-01 LAB — PROTIME-INR
INR: 1.41
PROTHROMBIN TIME: 17.2 s — AB (ref 11.4–15.2)

## 2018-03-01 LAB — FIBRINOGEN: Fibrinogen: 150 mg/dL — ABNORMAL LOW (ref 210–475)

## 2018-03-01 MED ORDER — MORPHINE SULFATE (PF) 2 MG/ML IV SOLN
2.0000 mg | INTRAVENOUS | Status: DC | PRN
  Administered 2018-03-01 – 2018-03-02 (×3): 2 mg via INTRAVENOUS
  Filled 2018-03-01 (×3): qty 1

## 2018-03-01 MED ORDER — ROCURONIUM BROMIDE 100 MG/10ML IV SOLN
INTRAVENOUS | Status: DC | PRN
  Administered 2018-03-01 (×6): 50 mg via INTRAVENOUS

## 2018-03-01 MED ORDER — HEPARIN SODIUM (PORCINE) 1000 UNIT/ML IJ SOLN
INTRAMUSCULAR | Status: AC
  Filled 2018-03-01: qty 1

## 2018-03-01 MED ORDER — DEXTROSE 5 % IV SOLN
1.0000 g | Freq: Two times a day (BID) | INTRAVENOUS | Status: AC
  Administered 2018-03-01 – 2018-03-03 (×4): 1 g via INTRAVENOUS
  Filled 2018-03-01 (×5): qty 1

## 2018-03-01 MED ORDER — LIDOCAINE 2% (20 MG/ML) 5 ML SYRINGE
INTRAMUSCULAR | Status: AC
  Filled 2018-03-01: qty 5

## 2018-03-01 MED ORDER — LACTATED RINGERS IV SOLN
INTRAVENOUS | Status: DC | PRN
  Administered 2018-03-01: 03:00:00 via INTRAVENOUS

## 2018-03-01 MED ORDER — MILRINONE LACTATE IN DEXTROSE 20-5 MG/100ML-% IV SOLN
0.2500 ug/kg/min | INTRAVENOUS | Status: AC
  Administered 2018-03-01: 0.25 ug/kg/min via INTRAVENOUS

## 2018-03-01 MED ORDER — MAGNESIUM SULFATE 2 GM/50ML IV SOLN
2.0000 g | Freq: Once | INTRAVENOUS | Status: AC
  Administered 2018-03-01: 2 g via INTRAVENOUS
  Filled 2018-03-01: qty 50

## 2018-03-01 MED ORDER — SODIUM CHLORIDE 0.9 % IV SOLN
250.0000 mL | INTRAVENOUS | Status: DC
  Administered 2018-03-14: 250 mL via INTRAVENOUS

## 2018-03-01 MED ORDER — ACETAMINOPHEN 500 MG PO TABS
1000.0000 mg | ORAL_TABLET | Freq: Four times a day (QID) | ORAL | Status: AC
  Administered 2018-03-02 – 2018-03-06 (×15): 1000 mg via ORAL
  Filled 2018-03-01 (×15): qty 2

## 2018-03-01 MED ORDER — SODIUM CHLORIDE 0.9 % IV SOLN
INTRAVENOUS | Status: DC | PRN
  Administered 2018-03-01: 750 mg via INTRAVENOUS

## 2018-03-01 MED ORDER — BISACODYL 10 MG RE SUPP
10.0000 mg | Freq: Every day | RECTAL | Status: DC
  Filled 2018-03-01: qty 1

## 2018-03-01 MED ORDER — PROTAMINE SULFATE 10 MG/ML IV SOLN
INTRAVENOUS | Status: AC
  Filled 2018-03-01: qty 5

## 2018-03-01 MED ORDER — PRISMASOL BGK 4/2.5 32-4-2.5 MEQ/L IV SOLN
INTRAVENOUS | Status: DC
  Administered 2018-03-01 – 2018-03-05 (×7): via INTRAVENOUS_CENTRAL
  Filled 2018-03-01 (×12): qty 5000

## 2018-03-01 MED ORDER — SODIUM CHLORIDE 0.9 % IV SOLN
INTRAVENOUS | Status: DC | PRN
  Administered 2018-03-01: 05:00:00 via INTRAVENOUS

## 2018-03-01 MED ORDER — POTASSIUM CHLORIDE 10 MEQ/50ML IV SOLN: 10.0000 meq | INTRAVENOUS | Status: AC

## 2018-03-01 MED ORDER — SODIUM CHLORIDE 0.9 % IV SOLN
INTRAVENOUS | Status: DC
  Administered 2018-03-01: 1.7 [IU]/h via INTRAVENOUS
  Filled 2018-03-01: qty 1

## 2018-03-01 MED ORDER — SODIUM CHLORIDE 0.9% FLUSH: 10.0000 mL | INTRAVENOUS | Status: DC | PRN

## 2018-03-01 MED ORDER — INSULIN ASPART 100 UNIT/ML ~~LOC~~ SOLN: 0.0000 [IU] | Freq: Three times a day (TID) | SUBCUTANEOUS | Status: DC

## 2018-03-01 MED ORDER — MIDAZOLAM HCL 5 MG/5ML IJ SOLN
INTRAMUSCULAR | Status: DC | PRN
  Administered 2018-03-01: 1 mg via INTRAVENOUS
  Administered 2018-03-01 (×2): 3 mg via INTRAVENOUS
  Administered 2018-03-01: 1 mg via INTRAVENOUS
  Administered 2018-03-01: 2 mg via INTRAVENOUS

## 2018-03-01 MED ORDER — MIDAZOLAM HCL 10 MG/2ML IJ SOLN
INTRAMUSCULAR | Status: AC
  Filled 2018-03-01: qty 2

## 2018-03-01 MED ORDER — BISACODYL 5 MG PO TBEC
10.0000 mg | DELAYED_RELEASE_TABLET | Freq: Every day | ORAL | Status: DC
  Administered 2018-03-02 – 2018-03-18 (×13): 10 mg via ORAL
  Filled 2018-03-01 (×16): qty 2

## 2018-03-01 MED ORDER — VANCOMYCIN HCL IN DEXTROSE 1-5 GM/200ML-% IV SOLN: 1000.0000 mg | Freq: Two times a day (BID) | INTRAVENOUS | Status: DC

## 2018-03-01 MED ORDER — LACTATED RINGERS IV SOLN: INTRAVENOUS | Status: DC

## 2018-03-01 MED ORDER — HEPARIN SODIUM (PORCINE) 1000 UNIT/ML IJ SOLN
INTRAMUSCULAR | Status: DC | PRN
  Administered 2018-03-01: 23000 [IU] via INTRAVENOUS

## 2018-03-01 MED ORDER — ACETAMINOPHEN 325 MG PO TABS: 650.0000 mg | ORAL_TABLET | ORAL | Status: DC | PRN

## 2018-03-01 MED ORDER — PROTAMINE SULFATE 10 MG/ML IV SOLN
INTRAVENOUS | Status: AC
  Filled 2018-03-01: qty 25

## 2018-03-01 MED ORDER — 0.9 % SODIUM CHLORIDE (POUR BTL) OPTIME
TOPICAL | Status: DC | PRN
  Administered 2018-02-28: 4000 mL

## 2018-03-01 MED ORDER — SODIUM CHLORIDE 0.9 % IV SOLN
1.5000 g | Freq: Two times a day (BID) | INTRAVENOUS | Status: DC
  Filled 2018-03-01: qty 1.5

## 2018-03-01 MED ORDER — ETOMIDATE 2 MG/ML IV SOLN
INTRAVENOUS | Status: DC | PRN
  Administered 2018-03-01: 12 mg via INTRAVENOUS

## 2018-03-01 MED ORDER — ACETAMINOPHEN 160 MG/5ML PO SOLN: 650.0000 mg | Freq: Once | ORAL | Status: AC

## 2018-03-01 MED ORDER — HEPARIN SODIUM (PORCINE) 1000 UNIT/ML DIALYSIS
1000.0000 [IU] | INTRAMUSCULAR | Status: DC | PRN
  Administered 2018-03-05 (×2): 1200 [IU] via INTRAVENOUS_CENTRAL
  Filled 2018-03-01 (×5): qty 6

## 2018-03-01 MED ORDER — HEMOSTATIC AGENTS (NO CHARGE) OPTIME
TOPICAL | Status: DC | PRN
  Administered 2018-02-28 – 2018-03-01 (×3): 1 via TOPICAL

## 2018-03-01 MED ORDER — MIDAZOLAM HCL 2 MG/2ML IJ SOLN
2.0000 mg | INTRAMUSCULAR | Status: DC | PRN
Start: 2018-03-01 — End: 2018-03-03

## 2018-03-01 MED ORDER — LACTATED RINGERS IV SOLN
INTRAVENOUS | Status: DC
  Administered 2018-03-01: 10:00:00 via INTRAVENOUS

## 2018-03-01 MED ORDER — SODIUM CHLORIDE 0.9% FLUSH
3.0000 mL | Freq: Two times a day (BID) | INTRAVENOUS | Status: DC
  Administered 2018-03-02 – 2018-03-18 (×28): 3 mL via INTRAVENOUS

## 2018-03-01 MED ORDER — VANCOMYCIN HCL 1000 MG IV SOLR
INTRAVENOUS | Status: AC
  Filled 2018-03-01: qty 1000

## 2018-03-01 MED ORDER — MORPHINE SULFATE (PF) 2 MG/ML IV SOLN: 1.0000 mg | INTRAVENOUS | Status: AC | PRN

## 2018-03-01 MED ORDER — LACTATED RINGERS IV SOLN
INTRAVENOUS | Status: DC | PRN
  Administered 2018-03-01: 01:00:00 via INTRAVENOUS

## 2018-03-01 MED ORDER — ALBUMIN HUMAN 5 % IV SOLN
INTRAVENOUS | Status: DC | PRN
  Administered 2018-03-01: 04:00:00 via INTRAVENOUS

## 2018-03-01 MED ORDER — DEXMEDETOMIDINE HCL IN NACL 400 MCG/100ML IV SOLN
0.1000 ug/kg/h | INTRAVENOUS | Status: DC
  Administered 2018-03-01 – 2018-03-02 (×4): 0.7 ug/kg/h via INTRAVENOUS
  Filled 2018-03-01: qty 100
  Filled 2018-03-01: qty 200

## 2018-03-01 MED ORDER — SODIUM CHLORIDE 0.45 % IV SOLN
INTRAVENOUS | Status: DC | PRN
  Administered 2018-03-01: 10:00:00 via INTRAVENOUS

## 2018-03-01 MED ORDER — ROCURONIUM BROMIDE 50 MG/5ML IV SOLN
INTRAVENOUS | Status: AC
  Filled 2018-03-01: qty 1

## 2018-03-01 MED ORDER — SODIUM CHLORIDE 0.9 % FOR CRRT
INTRAVENOUS_CENTRAL | Status: DC | PRN
  Filled 2018-03-01: qty 1000

## 2018-03-01 MED ORDER — TRANEXAMIC ACID 1000 MG/10ML IV SOLN
1.5000 mg/kg/h | INTRAVENOUS | Status: DC
  Filled 2018-03-01: qty 25

## 2018-03-01 MED ORDER — MILRINONE LACTATE IN DEXTROSE 20-5 MG/100ML-% IV SOLN
0.2500 ug/kg/min | INTRAVENOUS | Status: DC
  Administered 2018-03-01 – 2018-03-02 (×2): 0.25 ug/kg/min via INTRAVENOUS
  Filled 2018-03-01 (×3): qty 100

## 2018-03-01 MED ORDER — PRISMASOL BGK 4/2.5 32-4-2.5 MEQ/L IV SOLN
INTRAVENOUS | Status: DC
  Administered 2018-03-01 – 2018-03-05 (×30): via INTRAVENOUS_CENTRAL
  Filled 2018-03-01 (×48): qty 5000

## 2018-03-01 MED ORDER — FAMOTIDINE IN NACL 20-0.9 MG/50ML-% IV SOLN
20.0000 mg | Freq: Two times a day (BID) | INTRAVENOUS | Status: AC
  Administered 2018-03-01 (×2): 20 mg via INTRAVENOUS
  Filled 2018-03-01: qty 50

## 2018-03-01 MED ORDER — SODIUM CHLORIDE 0.9% IV SOLUTION: Freq: Once | INTRAVENOUS | Status: DC

## 2018-03-01 MED ORDER — ROCURONIUM BROMIDE 50 MG/5ML IV SOLN
INTRAVENOUS | Status: AC
  Filled 2018-03-01: qty 3

## 2018-03-01 MED ORDER — LACTATED RINGERS IV SOLN: 500.0000 mL | Freq: Once | INTRAVENOUS | Status: DC | PRN

## 2018-03-01 MED ORDER — SODIUM CHLORIDE 0.9 % IV SOLN
1.5000 g | INTRAVENOUS | Status: DC
  Filled 2018-03-01: qty 1.5

## 2018-03-01 MED ORDER — SODIUM CHLORIDE 0.9 % IV SOLN
INTRAVENOUS | Status: DC
  Administered 2018-03-01: 10:00:00 via INTRAVENOUS

## 2018-03-01 MED ORDER — METOCLOPRAMIDE HCL 5 MG/ML IJ SOLN
10.0000 mg | Freq: Four times a day (QID) | INTRAMUSCULAR | Status: AC
  Administered 2018-03-01 – 2018-03-06 (×18): 10 mg via INTRAVENOUS
  Filled 2018-03-01 (×17): qty 2

## 2018-03-01 MED ORDER — PANTOPRAZOLE SODIUM 40 MG PO TBEC
40.0000 mg | DELAYED_RELEASE_TABLET | Freq: Every day | ORAL | Status: DC
  Administered 2018-03-03: 40 mg via ORAL
  Filled 2018-03-01: qty 1

## 2018-03-01 MED ORDER — VANCOMYCIN HCL IN DEXTROSE 1-5 GM/200ML-% IV SOLN: 1000.0000 mg | INTRAVENOUS | Status: DC

## 2018-03-01 MED ORDER — CALCIUM CHLORIDE 10 % IV SOLN
INTRAVENOUS | Status: DC | PRN
  Administered 2018-03-01: 300 mg via INTRAVENOUS
  Administered 2018-03-01: 1000 mg via INTRAVENOUS

## 2018-03-01 MED ORDER — MILRINONE LACTATE IN DEXTROSE 20-5 MG/100ML-% IV SOLN
0.2500 ug/kg/min | INTRAVENOUS | Status: DC
Start: 2018-03-02 — End: 2018-03-02

## 2018-03-01 MED ORDER — ARTIFICIAL TEARS OPHTHALMIC OINT
TOPICAL_OINTMENT | OPHTHALMIC | Status: AC
  Filled 2018-03-01: qty 7

## 2018-03-01 MED ORDER — FENTANYL CITRATE (PF) 250 MCG/5ML IJ SOLN
INTRAMUSCULAR | Status: DC | PRN
  Administered 2018-03-01: 50 ug via INTRAVENOUS
  Administered 2018-03-01 (×2): 100 ug via INTRAVENOUS
  Administered 2018-03-01: 150 ug via INTRAVENOUS
  Administered 2018-03-01 (×2): 100 ug via INTRAVENOUS
  Administered 2018-03-01: 50 ug via INTRAVENOUS
  Administered 2018-03-01: 100 ug via INTRAVENOUS

## 2018-03-01 MED ORDER — MAGNESIUM SULFATE 4 GM/100ML IV SOLN
4.0000 g | Freq: Once | INTRAVENOUS | Status: DC
  Filled 2018-03-01: qty 100

## 2018-03-01 MED ORDER — FENTANYL CITRATE (PF) 250 MCG/5ML IJ SOLN
INTRAMUSCULAR | Status: AC
  Filled 2018-03-01: qty 15

## 2018-03-01 MED ORDER — CALCIUM CHLORIDE 10 % IV SOLN: 1.0000 g | Freq: Once | INTRAVENOUS | Status: DC | PRN

## 2018-03-01 MED ORDER — ALBUMIN HUMAN 5 % IV SOLN
250.0000 mL | INTRAVENOUS | Status: DC | PRN
  Administered 2018-03-01 – 2018-03-02 (×3): 250 mL via INTRAVENOUS
  Filled 2018-03-01 (×2): qty 250

## 2018-03-01 MED ORDER — PROTAMINE SULFATE 10 MG/ML IV SOLN
INTRAVENOUS | Status: DC | PRN
  Administered 2018-03-01: 230 mg via INTRAVENOUS

## 2018-03-01 MED ORDER — ETOMIDATE 2 MG/ML IV SOLN
INTRAVENOUS | Status: AC
  Filled 2018-03-01: qty 10

## 2018-03-01 MED ORDER — ACETAMINOPHEN 160 MG/5ML PO SOLN
1000.0000 mg | Freq: Four times a day (QID) | ORAL | Status: AC
  Administered 2018-03-01 – 2018-03-02 (×3): 1000 mg
  Filled 2018-03-01 (×3): qty 40.6

## 2018-03-01 MED ORDER — ACETAMINOPHEN 650 MG RE SUPP
650.0000 mg | Freq: Once | RECTAL | Status: AC
  Administered 2018-03-01: 650 mg via RECTAL

## 2018-03-01 MED ORDER — PROMETHAZINE HCL 25 MG/ML IJ SOLN
25.0000 mg | Freq: Four times a day (QID) | INTRAMUSCULAR | Status: DC | PRN
  Administered 2018-03-01: 25 mg via INTRAVENOUS
  Filled 2018-03-01: qty 1

## 2018-03-01 MED ORDER — TRAMADOL HCL 50 MG PO TABS
50.0000 mg | ORAL_TABLET | ORAL | Status: DC | PRN
  Administered 2018-03-07 – 2018-03-13 (×2): 100 mg via ORAL
  Filled 2018-03-01 (×2): qty 2

## 2018-03-01 MED ORDER — SODIUM CHLORIDE 0.9 % IV SOLN
INTRAVENOUS | Status: DC
  Administered 2018-03-01 – 2018-03-08 (×5): via INTRAVENOUS

## 2018-03-01 MED ORDER — PRISMASOL BGK 4/2.5 32-4-2.5 MEQ/L IV SOLN
INTRAVENOUS | Status: DC
  Administered 2018-03-01 – 2018-03-05 (×4): via INTRAVENOUS_CENTRAL
  Filled 2018-03-01 (×6): qty 5000

## 2018-03-01 MED ORDER — SODIUM CHLORIDE 0.9 % IJ SOLN
INTRAMUSCULAR | Status: AC
  Filled 2018-03-01: qty 10

## 2018-03-01 MED ORDER — VANCOMYCIN HCL IN DEXTROSE 750-5 MG/150ML-% IV SOLN
750.0000 mg | INTRAVENOUS | Status: AC
  Administered 2018-03-02 – 2018-03-03 (×2): 750 mg via INTRAVENOUS
  Filled 2018-03-01 (×2): qty 150

## 2018-03-01 MED ORDER — RIFAMPIN 300 MG PO CAPS
600.0000 mg | ORAL_CAPSULE | Freq: Once | ORAL | Status: AC
  Administered 2018-03-02: 600 mg via ORAL
  Filled 2018-03-01: qty 2

## 2018-03-01 MED ORDER — OXYCODONE HCL 5 MG PO TABS
5.0000 mg | ORAL_TABLET | ORAL | Status: DC | PRN
  Administered 2018-03-02: 10 mg via ORAL
  Administered 2018-03-04 – 2018-03-05 (×2): 5 mg via ORAL
  Filled 2018-03-01 (×2): qty 1
  Filled 2018-03-01: qty 2

## 2018-03-01 MED ORDER — ALTEPLASE 2 MG IJ SOLR: 2.0000 mg | Freq: Once | INTRAMUSCULAR | Status: DC | PRN

## 2018-03-01 MED ORDER — INSULIN REGULAR BOLUS VIA INFUSION
0.0000 [IU] | Freq: Three times a day (TID) | INTRAVENOUS | Status: DC
  Filled 2018-03-01: qty 10

## 2018-03-01 MED ORDER — ASPIRIN 300 MG RE SUPP: 300.0000 mg | Freq: Every day | RECTAL | Status: DC

## 2018-03-01 MED ORDER — ONDANSETRON HCL 4 MG/2ML IJ SOLN: 4.0000 mg | Freq: Four times a day (QID) | INTRAMUSCULAR | Status: DC | PRN

## 2018-03-01 MED ORDER — ASPIRIN 81 MG PO CHEW
324.0000 mg | CHEWABLE_TABLET | Freq: Every day | ORAL | Status: DC
  Administered 2018-03-02: 324 mg
  Filled 2018-03-01: qty 4

## 2018-03-01 MED ORDER — PHENYLEPHRINE 40 MCG/ML (10ML) SYRINGE FOR IV PUSH (FOR BLOOD PRESSURE SUPPORT)
PREFILLED_SYRINGE | INTRAVENOUS | Status: AC
  Filled 2018-03-01: qty 10

## 2018-03-01 MED ORDER — SUCCINYLCHOLINE CHLORIDE 200 MG/10ML IV SOSY
PREFILLED_SYRINGE | INTRAVENOUS | Status: AC
  Filled 2018-03-01: qty 10

## 2018-03-01 MED ORDER — SODIUM CHLORIDE 0.9 % IV SOLN
INTRAVENOUS | Status: DC | PRN
  Administered 2018-03-01: 02:00:00 via INTRAVENOUS

## 2018-03-01 MED ORDER — SUCCINYLCHOLINE CHLORIDE 20 MG/ML IJ SOLN
INTRAMUSCULAR | Status: DC | PRN
  Administered 2018-03-01: 100 mg via INTRAVENOUS

## 2018-03-01 MED ORDER — FLUCONAZOLE IN SODIUM CHLORIDE 400-0.9 MG/200ML-% IV SOLN
400.0000 mg | Freq: Once | INTRAVENOUS | Status: AC
  Administered 2018-03-02: 400 mg via INTRAVENOUS
  Filled 2018-03-01: qty 200

## 2018-03-01 MED ORDER — CALCIUM CHLORIDE 10 % IV SOLN
1.0000 g | Freq: Once | INTRAVENOUS | Status: AC
  Administered 2018-03-01: 1 g via INTRAVENOUS

## 2018-03-01 MED ORDER — INSULIN ASPART 100 UNIT/ML ~~LOC~~ SOLN: 0.0000 [IU] | Freq: Every day | SUBCUTANEOUS | Status: DC

## 2018-03-01 MED ORDER — NOREPINEPHRINE 16 MG/250ML-% IV SOLN
0.0000 ug/min | INTRAVENOUS | Status: DC
  Administered 2018-03-01: 12 ug/min via INTRAVENOUS
  Administered 2018-03-02: 4 ug/min via INTRAVENOUS
  Filled 2018-03-01 (×2): qty 250

## 2018-03-01 MED ORDER — DOCUSATE SODIUM 100 MG PO CAPS
200.0000 mg | ORAL_CAPSULE | Freq: Every day | ORAL | Status: DC
  Administered 2018-03-02 – 2018-03-19 (×15): 200 mg via ORAL
  Filled 2018-03-01 (×19): qty 2

## 2018-03-01 MED ORDER — CHLORHEXIDINE GLUCONATE 0.12 % MT SOLN
15.0000 mL | OROMUCOSAL | Status: AC
  Administered 2018-03-01: 15 mL via OROMUCOSAL

## 2018-03-01 MED ORDER — SODIUM CHLORIDE 0.9 % IV SOLN
INTRAVENOUS | Status: AC
  Filled 2018-03-01: qty 1.2

## 2018-03-01 MED ORDER — NOREPINEPHRINE 4 MG/250ML-% IV SOLN
0.0000 ug/min | INTRAVENOUS | Status: DC
  Administered 2018-03-01: 4 ug/min via INTRAVENOUS

## 2018-03-01 MED ORDER — SODIUM CHLORIDE 0.9 % IV SOLN
INTRAVENOUS | Status: DC | PRN
  Administered 2018-03-01: 500 mL

## 2018-03-01 MED ORDER — ONDANSETRON HCL 4 MG/2ML IJ SOLN
4.0000 mg | Freq: Four times a day (QID) | INTRAMUSCULAR | Status: DC | PRN
  Administered 2018-03-04 – 2018-03-09 (×4): 4 mg via INTRAVENOUS
  Filled 2018-03-01 (×4): qty 2

## 2018-03-01 MED ORDER — ORAL CARE MOUTH RINSE
15.0000 mL | OROMUCOSAL | Status: DC
  Administered 2018-03-01 – 2018-03-02 (×10): 15 mL via OROMUCOSAL

## 2018-03-01 MED ORDER — CHLORHEXIDINE GLUCONATE CLOTH 2 % EX PADS
6.0000 | MEDICATED_PAD | Freq: Every day | CUTANEOUS | Status: DC
  Administered 2018-03-01 – 2018-03-02 (×2): 6 via TOPICAL

## 2018-03-01 MED ORDER — PROPOFOL 10 MG/ML IV BOLUS
INTRAVENOUS | Status: AC
  Filled 2018-03-01: qty 20

## 2018-03-01 MED ORDER — LIDOCAINE HCL 2 % IJ SOLN
5.0000 mL | Freq: Once | INTRAMUSCULAR | Status: AC
  Administered 2018-03-01: 100 mg

## 2018-03-01 MED ORDER — ASPIRIN EC 325 MG PO TBEC
325.0000 mg | DELAYED_RELEASE_TABLET | Freq: Every day | ORAL | Status: DC
  Administered 2018-03-03: 325 mg via ORAL
  Filled 2018-03-01: qty 1

## 2018-03-01 MED ORDER — CHLORHEXIDINE GLUCONATE 0.12% ORAL RINSE (MEDLINE KIT)
15.0000 mL | Freq: Two times a day (BID) | OROMUCOSAL | Status: DC
  Administered 2018-03-01 – 2018-03-02 (×2): 15 mL via OROMUCOSAL

## 2018-03-01 MED ORDER — SODIUM CHLORIDE 0.9% FLUSH: 3.0000 mL | INTRAVENOUS | Status: DC | PRN

## 2018-03-01 MED ORDER — LACTATED RINGERS IV BOLUS
1000.0000 mL | Freq: Once | INTRAVENOUS | Status: AC
  Administered 2018-03-01: 1000 mL via INTRAVENOUS

## 2018-03-01 MED ORDER — MILRINONE LACTATE IN DEXTROSE 20-5 MG/100ML-% IV SOLN: 0.2500 ug/kg/min | INTRAVENOUS | Status: DC

## 2018-03-01 MED ORDER — FENTANYL CITRATE (PF) 250 MCG/5ML IJ SOLN
INTRAMUSCULAR | Status: AC
  Filled 2018-03-01: qty 5

## 2018-03-01 MED ORDER — SODIUM CHLORIDE 0.9 % IV SOLN
0.0000 ug/min | INTRAVENOUS | Status: DC
  Administered 2018-03-01: 5 ug/min via INTRAVENOUS
  Administered 2018-03-02: 25 ug/min via INTRAVENOUS
  Administered 2018-03-02: 30 ug/min via INTRAVENOUS
  Administered 2018-03-03: 20 ug/min via INTRAVENOUS
  Filled 2018-03-01 (×3): qty 20
  Filled 2018-03-01 (×4): qty 2
  Filled 2018-03-01: qty 20

## 2018-03-01 MED ORDER — DEXTROSE 5 % IV SOLN
0.0000 ug/min | INTRAVENOUS | Status: DC
  Administered 2018-03-01: 1 ug/min via INTRAVENOUS

## 2018-03-01 MED FILL — Sodium Bicarbonate IV Soln 8.4%: INTRAVENOUS | Qty: 50 | Status: AC

## 2018-03-01 MED FILL — Sodium Chloride IV Soln 0.9%: INTRAVENOUS | Qty: 2000 | Status: AC

## 2018-03-01 MED FILL — Heparin Sodium (Porcine) Inj 1000 Unit/ML: INTRAMUSCULAR | Qty: 30 | Status: AC

## 2018-03-01 MED FILL — Heparin Sodium (Porcine) Inj 1000 Unit/ML: INTRAMUSCULAR | Qty: 10 | Status: AC

## 2018-03-01 MED FILL — Potassium Chloride Inj 2 mEq/ML: INTRAVENOUS | Qty: 40 | Status: AC

## 2018-03-01 MED FILL — Electrolyte-R (PH 7.4) Solution: INTRAVENOUS | Qty: 5000 | Status: AC

## 2018-03-01 MED FILL — Magnesium Sulfate Inj 50%: INTRAMUSCULAR | Qty: 10 | Status: AC

## 2018-03-01 NOTE — Progress Notes (Signed)
CSW met with daughter in the waiting room. Patient was urgently taken to the OR for pump exchange. Daughter shared events of the previous evening leading up to hospitalization. Daughter has notified family members and states they plan to visit later this afternoon. Daughter was updated on current situation by Zada Girt, [place] Coordinator in waiting area and appears to have better understanding of current medical needs. Daughter will remain in waiting area to speak further with Dr. Prescott Gum post OR. CSW will be available for supportive needs. Raquel Sarna, [name], [place]

## 2018-03-01 NOTE — Progress Notes (Signed)
Pharmacy Antibiotic Note  [name] is a 52 y.o. male with LVAD with pump thrombosis and pump exchange. Pharmacy consult to dose vancomycin. He is noted with ESRD and last dose of vancomycin was 1248m given pre-op today.  Plan:  -Hold vancomycin for now. Will follow plans for HD. Plans are for a total of 48hrs coverage -change zinacef to 1gm IV q24h x2 doses  Height: 5' 7"  (170.2 cm) Weight: 166 lb 3.6 oz (75.4 kg) IBW/kg (Calculated) : 66.1  Temp (24hrs), Avg:96.6 F (35.9 C), Min:96.6 F (35.9 C), Max:96.6 F (35.9 C)  Recent Labs  Lab 02/28/18 2108 03/01/18 0033  03/01/18 0246 03/01/18 0430 03/01/18 0451 03/01/18 0626 03/01/18 0705 03/01/18 0926  WBC 16.5* 14.9*  --   --   --   --   --   --  19.8*  CREATININE 3.98* 4.31*   < > 3.60* 3.70* 3.90* 3.10* 3.30*  --    < > = values in this interval not displayed.    Estimated Creatinine Clearance: 24.2 mL/min (A) (by C-G formula based on SCr of 3.3 mg/dL (H)).    Allergies  Allergen Reactions  . Metformin And Related Diarrhea    AHildred Laser PharmD Clinical Pharmacist Please check Amion for pharmacy contact number

## 2018-03-01 NOTE — Anesthesia Procedure Notes (Signed)
Procedure Name: Intubation Date/Time: 03/01/2018 1:23 AM Performed by: Clovis Cao, CRNA Pre-anesthesia Checklist: Patient identified, Emergency Drugs available, Suction available, Patient being monitored and Timeout performed Patient Re-evaluated:Patient Re-evaluated prior to induction Oxygen Delivery Method: Circle system utilized Preoxygenation: Pre-oxygenation with 100% oxygen Induction Type: Rapid sequence, IV induction and Cricoid Pressure applied Laryngoscope Size: Miller and 2 Grade View: Grade I Tube type: Subglottic suction tube Tube size: 7.5 mm Number of attempts: 1 Airway Equipment and Method: Stylet Placement Confirmation: ETT inserted through vocal cords under direct vision,  positive ETCO2 and breath sounds checked- equal and bilateral Secured at: 22 cm Tube secured with: Tape Dental Injury: Teeth and Oropharynx as per pre-operative assessment

## 2018-03-01 NOTE — Transfer of Care (Signed)
Immediate Anesthesia Transfer of Care Note  Patient: [name]  Procedure(s) Performed: TRANSESOPHAGEAL ECHOCARDIOGRAM (TEE) (N/A ) REDO INSERTION OF IMPLANTABLE LEFT VENTRICULAR ASSIST DEVICE - HEARTMATE 3 (N/A Chest)  Patient Location: ICU  Anesthesia Type:General  Level of Consciousness: sedated and Patient remains intubated per anesthesia plan  Airway & Oxygen Therapy: Patient remains intubated per anesthesia plan and Patient placed on Ventilator (see vital sign flow sheet for setting)  Post-op Assessment: Report given to RN and Post -op Vital signs reviewed and stable  MAP 85 upon arrival to icu; continued on nitric and gtts see EMAR  Post vital signs: Reviewed and stable  Last Vitals:  Vitals Value Taken Time  BP    Temp    Pulse 88 03/01/2018  9:13 AM  Resp 17 03/01/2018  9:15 AM  SpO2 100 % 03/01/2018  9:13 AM  Vitals shown include unvalidated device data.  Last Pain:  Vitals:   02/28/18 2313  TempSrc:   PainSc: 0-No pain         Complications: No apparent anesthesia complications

## 2018-03-01 NOTE — Consult Note (Signed)
Renal Service Consult Note [facility]  [name] 03/01/2018 [name] Requesting Physician:  Dr Haroldine Laws  Reason for Consult:  ESRD pt w/ LVAD pump thrombosis HPI: The patient is a 52 y.o. year-old w/ hx severe ICM/ HTN / HL/ DM2/ Crohn's/ CAD had NSTEMI/ cardiogenic shock in March 2018 w/ DES to LAD and RCA.  Then underwent LVAD implant in Oct 2018.  Post-op AKI on CKD (baseline creat 2) and required initiation of dialysis. Is getting HD mwf now via a [facility] and also has an AV graft in the RUA which hasn't been used for sometime.  Also hx of hemocolectomy in 2003 for Crohn's, Factor V leiden def and hx of recurrent DVT.   Patient has had problems w/ drive line infections.  Went to HD yesterday and they were unable to pull fluid, pt was not feeling well.  LVAD alarms started yest pm and pt brought to ED.  CT showed no flow through graft. Pt taken to OR emergently and found to have clotted LVAD pump and underwent removal and replacement w/ new LVAD heartmate 3.  Got 3u prbc and 2 ffp and 2 plts.  HD cath removed by IR yesterday as well.  Has chest tubes in MS and pleural spaces.  Asked to see for ESRD.     ROS  n/a  Past Medical History  Past Medical History:  Diagnosis Date  . Angina   . ASCVD (arteriosclerotic cardiovascular disease)    , Anterior infarction 2005, LAD diagonal bifurcation intervention 03/2004  . Automatic implantable cardiac defibrillator -St. Jude's       . Benign neoplasm of colon   . CHF (congestive heart failure) ([place])   . Chronic systolic heart failure ([place])   . Coronary artery disease     Widely patent previously placed stents in the left anterior   . Crohn's disease ([place])   . 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 Dialysis patient ([place])   . Dyspnea   . Gastroparesis   . GERD (gastroesophageal reflux disease)   . High cholesterol   . Hyperlipidemia   . Hypersomnolent    Previous diagnosis of narcolepsy   . Hypertension, essential   . Ischemic cardiomyopathy    Ejection fraction 15-20% catheterization 2010  . Type II or unspecified type diabetes mellitus without mention of complication, not stated as uncontrolled   . Unspecified gastritis and gastroduodenitis without mention of hemorrhage    Past Surgical History  Past Surgical History:  Procedure Laterality Date  . APPENDECTOMY    . AV FISTULA PLACEMENT Right 06/26/2017   Procedure: ARTERIOVENOUS (AV) FISTULA CREATION VERSUS GRAFT RIGHT  ARM;  Surgeon: Conrad Stockton, MD;  [facility];  Service: Vascular;  Laterality: Right;  . AV FISTULA PLACEMENT Right 10/31/2017   Procedure: INSERTION OF ARTERIOVENOUS (AV) ARTEGRAFT,  RIGHT UPPER ARM;  Surgeon: Conrad Chester, MD;  Location: [place];  Service: Vascular;  Laterality: Right;  . CARDIAC DEFIBRILLATOR PLACEMENT  2010   St. Jude ICD  . COLECTOMY  ~ 2003   "for Crohn's" ascending colon.    . CORONARY STENT INTERVENTION N/A 11/25/2016   Procedure: Coronary Stent Intervention;  Surgeon: Leonie Man, MD;  Location: [place] CV LAB;  Service: Cardiovascular;  Laterality: N/A;  . EP IMPLANTABLE DEVICE N/A 09/14/2016   Procedure: ICD Generator Changeout;  Surgeon: Deboraha Sprang, MD;  Location: [place] CV LAB;  Service: Cardiovascular;  Laterality: N/A;  .  ESOPHAGOGASTRODUODENOSCOPY N/A 07/12/2017   Procedure: ESOPHAGOGASTRODUODENOSCOPY (EGD);  Surgeon: Jerene Bears, MD;  Location[facility] ENDOSCOPY;  Service: Gastroenterology;  Laterality: N/A;  VAD pt so VAD team will need to accompany pt.   [name] FETAL SURGERY FOR CONGENITAL HERNIA     ????? pt has no knowledge of this..    . INGUINAL HERNIA REPAIR    . INSERTION OF DIALYSIS CATHETER Right 06/26/2017   Procedure: INSERTION OF TUNNELED  DIALYSIS CATHETER RIGHT INTERNAL JUGULAR;  Surgeon: Conrad [place], MD;  Location: [place];  Service: Vascular;  Laterality: Right;  . INSERTION OF IMPLANTABLE LEFT VENTRICULAR ASSIST DEVICE N/A 06/12/2017    Procedure: INSERTION OF IMPLANTABLE LEFT VENTRICULAR ASSIST [place] 3;  Surgeon: Ivin Poot, MD;  Location: [place];  Service: Open Heart Surgery;  Laterality: N/A;  HM3 LVAD  CIRC ARREST  NITRIC OXIDE  . IR REMOVAL TUN CV CATH W/O FL  02/26/2018  . IR THORACENTESIS ASP PLEURAL SPACE W/IMG GUIDE  06/28/2017  . LIGATION OF ARTERIOVENOUS  FISTULA Right 10/31/2017   Procedure: LIGATION OF BRACHIO-BASILIC VEIN TRANSPOSITION RIGHT ARM;  Surgeon: Conrad Bibb, MD;  Location: [place];  Service: Vascular;  Laterality: Right;  . MULTIPLE EXTRACTIONS WITH ALVEOLOPLASTY N/A 06/08/2017   Procedure: MULTIPLE EXTRACTION WITH ALVEOLOPLASTY AND PRE PROSTHETIC SURGERY AS NEEDED;  Surgeon: Lenn Cal, DDS;  Location: [place];  Service: Oral Surgery;  Laterality: N/A;  . RIGHT HEART CATH N/A 06/07/2017   Procedure: RIGHT HEART CATH;  Surgeon: Larey Dresser, MD;  Location: [place] CV LAB;  Service: Cardiovascular;  Laterality: N/A;  . RIGHT HEART CATH N/A 02/08/2018   Procedure: RIGHT HEART CATH;  Surgeon: Larey Dresser, MD;  Location: [place] CV LAB;  Service: Cardiovascular;  Laterality: N/A;  . RIGHT/LEFT HEART CATH AND CORONARY ANGIOGRAPHY N/A 11/23/2016   Procedure: Right/Left Heart Cath and Coronary Angiography;  Surgeon: Larey Dresser, MD;  Location: [place] CV LAB;  Service: Cardiovascular;  Laterality: N/A;  . TEE WITHOUT CARDIOVERSION N/A 06/12/2017   Procedure: TRANSESOPHAGEAL ECHOCARDIOGRAM (TEE);  Surgeon: Prescott Gum, Collier Salina, MD;  Location: [place];  Service: Open Heart Surgery;  Laterality: N/A;  . THROMBECTOMY AND REVISION OF ARTERIOVENTOUS (AV) GORETEX  GRAFT Right 12/12/2017   Procedure: THROMBECTOMY ARTERIOVENTOUS (AV) GORETEX  GRAFT RIGHT UPPER ARM;  Surgeon: Conrad Walcott, MD;  Location: [facility] OR;  Service: Vascular;  Laterality: Right;   Family History  Family History  Problem Relation Age of Onset  . Colon polyps Mother   . Diabetes Mother   . Heart attack Father    . Diabetes Father   . Stroke Paternal Grandfather   . Colitis Sister   . Heart disease Brother   . Colitis Sister   . Colon cancer Neg Hx    Social History  reports that he has never smoked. He has never used smokeless tobacco. He reports that he does not drink alcohol or use drugs. Allergies  Allergies  Allergen Reactions  . Metformin And Related Diarrhea   Home medications Prior to Admission medications   Medication Sig Start Date End Date Taking? Authorizing Provider  amLODipine (NORVASC) 2.5 MG tablet Take 1 tablet (2.5 mg total) by mouth daily. 01/02/18 04/02/18  Larey Dresser, MD  aspirin EC 81 MG tablet Take 1 tablet (81 mg total) by mouth daily. 07/26/17   Larey Dresser, MD  atorvastatin (LIPITOR) 40 MG tablet Take 1 tablet (40 mg total) by mouth daily at 6  PM. 01/25/18   [name], MD  docusate sodium (COLACE) 100 MG capsule Take 2 capsules (200 mg total) by mouth daily. Patient taking differently: Take 100 mg by mouth 2 (two) times daily.  07/03/17   Clegg, Amy D, NP  dronabinol (MARINOL) 2.5 MG capsule Take 1 capsule (2.5 mg total) by mouth 2 (two) times daily before lunch and supper. 01/30/18   Larey Dresser, MD  hydrALAZINE (APRESOLINE) 25 MG tablet Take 1.5 tablets (37.5 mg total) by mouth 3 (three) times daily. Take on non-hd days 10/24/17   Larey Dresser, MD  insulin aspart (NOVOLOG) 100 UNIT/ML injection Inject 5 Units into the skin 3 (three) times daily with meals. 01/22/18   Georgiana Shore, NP  insulin glargine (LANTUS) 100 UNIT/ML injection Inject 0.1 mLs (10 Units total) into the skin daily. 01/22/18   Georgiana Shore, NP  LORazepam (ATIVAN) 0.5 MG tablet Take 1 tablet (0.5 mg total) by mouth every 12 (twelve) hours as needed for anxiety or sleep. 08/04/17   Clegg, Amy D, NP  metoCLOPramide (REGLAN) 10 MG tablet Take 1 tablet (10 mg total) by mouth 3 (three) times daily. 01/22/18   Georgiana Shore, NP  ondansetron (ZOFRAN ODT) 4 MG disintegrating  tablet Take 1 tablet (4 mg total) by mouth every 6 (six) hours as needed for nausea or vomiting. 01/10/17   Esterwood, Amy S, PA-C  pantoprazole (PROTONIX) 40 MG tablet Take 40 mg by mouth 2 (two) times daily.     [provider]  promethazine (PHENERGAN) 12.5 MG tablet Take 1 tablet (12.5 mg total) by mouth every 6 (six) hours as needed for nausea or vomiting. 09/16/17   Daune Perch, NP  sildenafil (REVATIO) 20 MG tablet Take 1 tablet (20 mg total) by mouth 3 (three) times daily. 07/26/17   Larey Dresser, MD  warfarin (COUMADIN) 5 MG tablet Take 1 tablet (5 mg total) by mouth daily at 6 PM. 01/22/18   Georgiana Shore, NP   Liver Function Tests Recent Labs  Lab 02/28/18 2108  AST 46*  ALT 49*  ALKPHOS 127*  BILITOT 1.5*  PROT 8.1  ALBUMIN 3.8   Recent Labs  Lab 02/28/18 2108  LIPASE 21   CBC Recent Labs  Lab 02/28/18 2108 03/01/18 0033  03/01/18 0924 03/01/18 0926 03/01/18 1421 03/01/18 1429  WBC 16.5* 14.9*  --   --  19.8* 15.7*  --   NEUTROABS 14.8*  --   --   --   --   --   --   HGB 12.3* 11.1*   < >  --  10.9* 11.1* 10.9*  HCT 39.4 34.8*   < >  --  32.4* 33.2* 32.0*  MCV 88.3 87.2  --   --  86.9 87.1  --   PLT 191 198   < > 120* 120* 140*  --    < > = values in this interval not displayed.   Basic Metabolic Panel Recent Labs  Lab 02/28/18 2108 03/01/18 0033  03/01/18 0246 03/01/18 0430 03/01/18 0451 03/01/18 2841 03/01/18 3244 03/01/18 0102 03/01/18 0823 03/01/18 0917 03/01/18 0926 03/01/18 1421 03/01/18 1429  NA 133* 134*   < > 135 135 136 138 139 140 141 140 139  --  139  K 4.4 4.6   < > 4.4 4.3 4.1 4.4 3.8 3.6 3.5 3.3* 3.4*  --  3.7  CL 93* 95*   < > 99 102 101 98 101  --   --   --  106  --  103  CO2 21* 20*  --   --   --   --   --   --   --   --   --  21*  --   --   GLUCOSE 150* 187*   < > 181* 148* 139* 130* 122*  --   --  118* 114*  --  113*  BUN 17 19   < > 19 20 20  23* 23*  --   --   --  18  --  20  CREATININE 3.98* 4.31*   < >  3.60* 3.70* 3.90* 3.10* 3.30*  --   --   --  3.49* 3.82* 3.70*  CALCIUM 8.8* 8.4*  --   --   --   --   --   --   --   --   --  7.2*  --   --    < > = values in this interval not displayed.   Iron/TIBC/Ferritin/ %Sat    Component Value Date/Time   IRON 40 (L) 11/02/2017 0016   TIBC 188 (L) 11/02/2017 0016   FERRITIN 523 (H) 11/02/2017 0016   IRONPCTSAT 21 11/02/2017 0016    Vitals:   03/01/18 1500 03/01/18 1508 03/01/18 1557 03/01/18 1600  BP: (!) 84/34 (!) 84/34  (!) 85/65  Pulse: (!) 58 100 96 95  Resp: 12 14 12 14   Temp: 98.4 F (36.9 C)  98.2 F (36.8 C)   TempSrc:   Core (Comment)   SpO2: 100% 100%  100%  Weight:      Height:       Exam Gen intubated on vent, mild facial edema No rash, cyanosis or gangrene Sclera anicteric, throat w ETT  No jvd or bruits Chest clear ant and lat, mult chest tubes and drive-line in ant chest RRR hum of LVAD  Abd soft ntnd no mass or ascites +bs GU normal male MS no joint effusions or deformity Ext 1+ bilat LE edema, no wounds or ulcers Neuro is sedated on the vent    Home meds:  - norvasc 2.5/ hydralazine 37.10m tid  - ecasa/ lipitor/ sildenafil 20 tid/ coumadin 5 hs  - PPI/ reglan 10 tid/ ativan prn/ marinol prn  - lantus 10 u qd/ novolog 5 u tid  Dialysis: MWF [facility]  4h 120m 80kg  3K/ 2Ca  RUA AVG / R IJ TDC  Hep none  - calc 0.5 ug tiw  - mircera 50 q  2, last 6/26  - venofer 50 weekly     Impression: 1  LVAD pump thrombosis - s/p redo sternotomy w/ pump exchange of LVAD heartmate 3 2  ESRD - usual HD is MWF. Electrolytes are ok.  TDC removed.  Has R upper arm AVG but no bruit, may or may not be patent.  3  Volume - some mild IS edema by CXR. Not grossly overloaded on exam.  4  Shock - cardiogenic on milrinone and epi gtt's 5  VDRF - on vent and sedated 6  DM - on IV insulin 7  ID - on vanc IV and po rifadin  Plan - no need for acute RRT, will reassess in am.  Not sure if he will need CRRT or not, depends on vol  status and hemodynamics.  If does need CRRT will need a temporary HD cath, have d/w Dr BeHaroldine Laws  [employer] [phone] 03/01/2018, 4:03 PM

## 2018-03-01 NOTE — Progress Notes (Signed)
Advanced Heart Failure Rounding Note  PCP-Cardiologist: No primary care provider on file.   Subjective:    Back from OR this am after emergent VAD exchange for clotted outflow graft.   Remains intubated/sedated.  Now epi at 1, NE 12, milrinone 0.25 and NO at 20.  MAP 70s   No bleeding  Swan #s CVP 15 PA 23/14 Thermo 3.5/1.9 Co-ox 71%  Flow 3.3 Speed 5200  PI 3.3 Power 3.7  VAD interrogated personally. Parameters stable.   Objective:   Weight Range: 75.4 kg (166 lb 3.6 oz) Body mass index is 26.03 kg/m.   Vital Signs:   Temp:  [94.6 F (34.8 C)-99.3 F (37.4 C)] 98.1 F (36.7 C) (06/27 1900) Pulse Rate:  [27-108] 98 (06/27 1900) Resp:  [0-25] 14 (06/27 1900) BP: (70-149)/(23-97) 100/83 (06/27 1900) SpO2:  [94 %-100 %] 100 % (06/27 1900) Arterial Line BP: (70-109)/(55-77) 96/70 (06/27 1900) FiO2 (%):  [50 %] 50 % (06/27 1900) Weight:  [75.4 kg (166 lb 3.6 oz)-80 kg (176 lb 5.9 oz)] 75.4 kg (166 lb 3.6 oz) (06/27 0030)    Weight change: Filed Weights   02/28/18 2042 03/01/18 0030  Weight: 80 kg (176 lb 5.9 oz) 75.4 kg (166 lb 3.6 oz)    Intake/Output:   Intake/Output Summary (Last 24 hours) at 03/01/2018 1922 Last data filed at 03/01/2018 1900 Gross per 24 hour  Intake 8441.87 ml  Output 3163 ml  Net 5278.87 ml      Physical Exam    General:  Intubated sedated  HEENT: Anicteric +ETT Neck: supple.RIJ swan  Cor: LVAD hum.  Sternal dressing + CTs Lungs: Coarse  Abdomen: + distended quiet. Driveline site ok .  Extremities: no cyanosis, clubbing, rash, 2+ edema Neuro: intubated sedates  Telemetry   NSR 90s Personally reviewed    Labs    CBC Recent Labs    02/28/18 2108  03/01/18 0926 03/01/18 1421 03/01/18 1429  WBC 16.5*   < > 19.8* 15.7*  --   NEUTROABS 14.8*  --   --   --   --   HGB 12.3*   < > 10.9* 11.1* 10.9*  HCT 39.4   < > 32.4* 33.2* 32.0*  MCV 88.3   < > 86.9 87.1  --   PLT 191   < > 120* 140*  --    < > = values in  this interval not displayed.   Basic Metabolic Panel Recent Labs    03/01/18 0033  03/01/18 0926 03/01/18 1421 03/01/18 1429  NA 134*   < > 139  --  139  K 4.6   < > 3.4*  --  3.7  CL 95*   < > 106  --  103  CO2 20*  --  21*  --   --   GLUCOSE 187*   < > 114*  --  113*  BUN 19   < > 18  --  20  CREATININE 4.31*   < > 3.49* 3.82* 3.70*  CALCIUM 8.4*  --  7.2*  --   --   MG  --   --  1.6* 1.5*  --    < > = values in this interval not displayed.   Liver Function Tests Recent Labs    02/28/18 2108  AST 46*  ALT 49*  ALKPHOS 127*  BILITOT 1.5*  PROT 8.1  ALBUMIN 3.8   Recent Labs    02/28/18 2108  LIPASE 21  Cardiac Enzymes No results for input(s): CKTOTAL, CKMB, CKMBINDEX, TROPONINI in the last 72 hours.  BNP: BNP (last 3 results) Recent Labs    06/18/17 2305 06/26/17 0004 07/03/17 0037  BNP 746.0* 1,409.3* 758.9*    ProBNP (last 3 results) No results for input(s): PROBNP in the last 8760 hours.   D-Dimer Recent Labs    03/01/18 0924  DDIMER 2.73*   Hemoglobin A1C No results for input(s): HGBA1C in the last 72 hours. Fasting Lipid Panel No results for input(s): CHOL, HDL, LDLCALC, TRIG, CHOLHDL, LDLDIRECT in the last 72 hours. Thyroid Function Tests No results for input(s): TSH, T4TOTAL, T3FREE, THYROIDAB in the last 72 hours.  Invalid input(s): FREET3  Other results:   Imaging    Ct Chest W Contrast  Result Date: 02/28/2018 CLINICAL DATA:  52 year old male with concern for LVAD dysfunction. EXAM: CT CHEST WITH CONTRAST TECHNIQUE: Multidetector CT imaging of the chest was performed during intravenous contrast administration. CONTRAST:  46m OMNIPAQUE IOHEXOL 300 MG/ML  SOLN COMPARISON:  Chest radiograph dated 01/19/2018 and CT dated 06/27/2017 FINDINGS: Evaluation is limited due to streak artifact caused by pacemaker and LVAD hardware. Cardiovascular: There is moderate cardiomegaly. No pericardial effusion. Multi vessel coronary vascular  calcification. Left pectoral pacemaker device is noted. There is a LVAD device. The inflow cannula is along the long axis of the left ventricle. The outflow cannula extends to the upper ascending aorta. Evaluation of the inflow cannula is limited due to severe streak artifacts. There is non opacification of the outflow cannula limiting evaluation of lumen. However, underlying occlusion of the tract is not excluded. There may be a focal area of kinking of the outflow cannula in the anterior chest (sagittal series 6, image 68). Evaluation of this area is dated due to streak artifact. There is retrograde opacification of the distal outflow cannula at the junction of the SVC. Focal area of filling defect in the distal outflow cannula (series 3 images 59-63) may be related to mixing artifact, although a clot is not excluded. There is opacification of the aorta directly from the left ventricle. There is no aortic aneurysm or dissection. The origins of the great vessels of the aortic arch are patent. 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 The central pulmonary arteries are patent as visualized. Evaluation of the pulmonary artery branches is limited due to suboptimal opacification and timing of the contrast as well as streak artifact caused by LVAD. Mediastinum/Nodes: There is no hilar or mediastinal adenopathy. There is a small hiatal hernia. The esophagus and thyroid gland are grossly unremarkable. Lungs/Pleura: There are linear atelectasis/scarring. There is no focal consolidation, pleural effusion, or pneumothorax. The central airways are patent. Upper Abdomen: Probable mild fatty infiltration of the liver. The visualized upper abdomen is otherwise unremarkable. Musculoskeletal: Median sternotomy wires. No acute osseous pathology. IMPRESSION: 1. LVAD device with possible focal kinking of the outflow cannula. There is non opacification of the outflow cannula limiting the evaluation. Underlying thrombus or occlusion  of the outflow cannula is not excluded. The aorta is perfused directly via the left ventricle. 2. Nonocclusive clot in the SVC. 3. Other findings as above. The LVAD findings were discussed in person with Dr. BHaroldine Lawsand the SVC clot was reported to Dr. MDayna Barkerby telephone at the time of interpretation on 02/28/2018 at 11:19 pm. Electronically Signed   By: AAnner CreteM.D.   On: 02/28/2018 23:23   Dg Chest Port 1 View  Result Date: 03/01/2018 CLINICAL DATA:  Central line  placement. EXAM: PORTABLE CHEST 1 VIEW COMPARISON:  03/01/2018 FINDINGS: Interval placement of LEFT IJ central line, tip overlying the superior vena cava. Endotracheal tube is stable, 3.9 centimeters above carina. RIGHT IJ Swan-Ganz catheter tip overlies the level of the pulmonary outflow tract. Bilateral chest tubes are in place. Patient has a LEFT ventricular assist device. Stable appearance of pacemaker leads. There is no pneumothorax. Heart is mildly enlarged. There is minimal LEFT base atelectasis. No pneumothorax. IMPRESSION: Interval placement of LEFT IJ central line.  No pneumothorax. Electronically Signed   By: Nolon Nations M.D.   On: 03/01/2018 18:56   Dg Chest Port 1 View  Result Date: 03/01/2018 CLINICAL DATA:  LVAD in place EXAM: PORTABLE CHEST - 1 VIEW COMPARISON:  01/19/2018 FINDINGS: Interval median sternotomy. Endotracheal tube tip 2.9 cm above carina. Nasogastric tube extends least as far as the stomach, tip not seen. Bilateral chest tubes in place with no pneumothorax. Relatively low lung volumes with some crowding of bronchovascular structures. If left subclavian AICD stable. The tunneled hemodialysis catheter is been removed and a right IJ Swan-Ganz extends to the right pulmonary artery. LVAD device in expected location. No definite pleural effusion. IMPRESSION: 1. Postop change with support hardware as above. 2. No pneumothorax or effusion. Electronically Signed   By: [name] M.D.   On: 03/01/2018 10:21       Medications:     Scheduled Medications: . [START ON 03/02/2018] acetaminophen  1,000 mg Oral Q6H   Or  . [START ON 03/02/2018] acetaminophen (TYLENOL) oral liquid 160 mg/5 mL  1,000 mg Per Tube Q6H  . [START ON 03/02/2018] aspirin EC  325 mg Oral Daily   Or  . [START ON 03/02/2018] aspirin  324 mg Per Tube Daily   Or  . [START ON 03/02/2018] aspirin  300 mg Rectal Daily  . [START ON 03/02/2018] bisacodyl  10 mg Oral Daily   Or  . [START ON 03/02/2018] bisacodyl  10 mg Rectal Daily  . chlorhexidine gluconate (MEDLINE KIT)  15 mL Mouth Rinse BID  . Chlorhexidine Gluconate Cloth  6 each Topical Daily  . [START ON 03/02/2018] docusate sodium  200 mg Oral Daily  . insulin regular  0-10 Units Intravenous TID WC  . mouth rinse  15 mL Mouth Rinse 10 times per day  . metoCLOPramide (REGLAN) injection  10 mg Intravenous Q6H  . [START ON 03/03/2018] pantoprazole  40 mg Oral Daily  . [START ON 03/02/2018] rifampin  600 mg Oral Once  . [START ON 03/02/2018] sodium chloride flush  3 mL Intravenous Q12H     Infusions: . sodium chloride 10 mL/hr at 03/01/18 1900  . [START ON 03/02/2018] sodium chloride    . sodium chloride 10 mL/hr at 03/01/18 1300  . sodium chloride 10 mL/hr at 03/01/18 1900  . albumin human    . cefUROXime (ZINACEF)  IV    . dexmedetomidine (PRECEDEX) IV infusion 0.7 mcg/kg/hr (03/01/18 1900)  . EPINEPHrine 4 mg in dextrose 5% 250 mL infusion (16 mcg/mL) 1 mcg/min (03/01/18 1900)  . famotidine (PEPCID) IV Stopped (03/01/18 1002)  . [START ON 03/02/2018] fluconazole (DIFLUCAN) IV    . insulin (NOVOLIN-R) infusion 0.5 mL/hr at 03/01/18 1900  . lactated ringers    . lactated ringers Stopped (03/01/18 1659)  . lactated ringers    . magnesium sulfate    . milrinone 0.25 mcg/kg/min (03/01/18 1900)  . [START ON 03/02/2018] milrinone    . norepinephrine (LEVOPHED) Adult infusion 12 mcg/min (03/01/18  1900)  . phenylephrine (NEO-SYNEPHRINE) Adult infusion    . dialysis replacement  fluid (prismasate)    . dialysis replacement fluid (prismasate)    . dialysate (PRISMASATE)    . sodium chloride       PRN Medications:  sodium chloride, albumin human, alteplase, calcium chloride, heparin, lactated ringers, midazolam, morphine injection, morphine injection, ondansetron (ZOFRAN) IV, oxyCODONE, sodium chloride, sodium chloride flush, [START ON 03/02/2018] sodium chloride flush, traMADol    Patient Profile    Mr Kaluzny is a 52 year old with a history of CAD and ischemic cardiomyopathy now s/p Heartmate 3 LVAD on 10/18. Post VAD course complicated by ESRD and severe DM gastroparesis. Admitted with low/no-flow alarms due to LVAD outflow graft kink (does not have bend clip).     Assessment/Plan   1. Cardiogenic shock due to LVAD complication with LVAD outflow graft thrombosis - s/p Emergent LVAD pump exchange 6/27 - remains intubated post-op. hemodynamics look good. Continue current pressors and NO - Volume status elevated. I placed LIJ trialysis cath for slow volume removal  - VAD interrogated personally. Parameters stable.  2. Intractable Nausea/Vomiting secondary to gastroparesis - Now intubated - Once extubated will resume reglan, phenergan, and ativan.  - Continue erythromycin 250 mg three times a day for gastroparesis. He has been seen by Dr Derrill Kay on 01/24/18 [facility])  3. ESRD - Discussed with Dr. Jonnie Finner. Not candidate for iHD. Trialysis cath placed today. Start CRT   5. DM,insulin dependent - SSI for now  6. Acute respiratory failure - Wean vent as tolerated. Volume removal should help    CRITICAL CARE Performed by: [name]  Total critical care time: 35 minutes  Critical care time was exclusive of separately billable procedures and treating other patients.  Critical care was necessary to treat or prevent imminent or life-threatening deterioration.  Critical care was time spent personally by me (independent of  midlevel providers or residents) on the following activities: development of treatment plan with patient and/or surrogate as well as nursing, discussions with consultants, evaluation of patient's response to treatment, examination of patient, obtaining history from patient or surrogate, ordering and performing treatments and interventions, ordering and review of laboratory studies, ordering and review of radiographic studies, pulse oximetry and re-evaluation of patient's condition.    Length of Stay: 1  [name], MD  03/01/2018, 7:22 PM  Advanced Heart Failure Team Pager [phone] (M-F; 7a - 4p)  Please contact [facility] for night-coverage after hours (4p -7a ) and weekends on [email or web address]

## 2018-03-01 NOTE — Progress Notes (Signed)
Pharmacy Antibiotic Note  [name] is a 52 y.o. male with LVAD with pump thrombosis and pump exchange. Pharmacy consult to dose vancomycin. He is noted with ESRD and last dose of vancomycin was 1273m given pre-op today.  Update: patient started on CRRT post-op. Will adjust cefuroxime and vancomycin dosing for CRRT, using pre-op dose as loading dose since HD patient.   Plan:  -Will start vancomycin 750 mg IV every 24 hours (beginning 6/28 at 0200) for 48 hours -Change zinacef to 1 gm IV every 12 hours for 48 hours -Will defer to physicians if need extension in length of therapy  Height: 5' 7"  (170.2 cm) Weight: 166 lb 3.6 oz (75.4 kg) IBW/kg (Calculated) : 66.1  Temp (24hrs), Avg:97.8 F (36.6 C), Min:94.6 F (34.8 C), Max:99.3 F (37.4 C)  Recent Labs  Lab 02/28/18 2108 03/01/18 0033  03/01/18 0626 03/01/18 0705 03/01/18 0926 03/01/18 1421 03/01/18 1429  WBC 16.5* 14.9*  --   --   --  19.8* 15.7*  --   CREATININE 3.98* 4.31*   < > 3.10* 3.30* 3.49* 3.82* 3.70*   < > = values in this interval not displayed.    Estimated Creatinine Clearance: 21.6 mL/min (A) (by C-G formula based on SCr of 3.7 mg/dL (H)).    Allergies  Allergen Reactions  . Metformin And Related Diarrhea   KDoylene Canard PharmD Clinical Pharmacist  Pager: [phone]: [phone]

## 2018-03-01 NOTE — Anesthesia Preprocedure Evaluation (Signed)
Anesthesia Evaluation  Patient identified by MRN, date of birth, ID band  Reviewed: Allergy & Precautions, NPO status Preop documentation limited or incomplete due to emergent nature of procedure.  Airway Mallampati: III  TM Distance: >3 FB Neck ROM: Full    Dental  (+) Edentulous Lower, Upper Dentures   Pulmonary           Cardiovascular hypertension, + CAD, + Past MI and +CHF  + Cardiac Defibrillator      Neuro/Psych    GI/Hepatic GERD  ,  Endo/Other  diabetes  Renal/GU ESRF and DialysisRenal disease     Musculoskeletal   Abdominal   Peds  Hematology   Anesthesia Other Findings   Reproductive/Obstetrics                             Anesthesia Physical Anesthesia Plan  ASA: IV and emergent  Anesthesia Plan: General   Post-op Pain Management:    Induction: Intravenous, Rapid sequence and Cricoid pressure planned  PONV Risk Score and Plan:   Airway Management Planned: Oral ETT  Additional Equipment: Arterial line, CVP, PA Cath, TEE and Ultrasound Guidance Line Placement  Intra-op Plan:   Post-operative Plan: Post-operative intubation/ventilation  Informed Consent: I have reviewed the patients History and Physical, chart, labs and discussed the procedure including the risks, benefits and alternatives for the proposed anesthesia with the patient or authorized representative who has indicated his/her understanding and acceptance.   History available from chart only and Only emergency history available  Plan Discussed with: CRNA, Anesthesiologist and Surgeon  Anesthesia Plan Comments:         Anesthesia Quick Evaluation

## 2018-03-01 NOTE — H&P (Signed)
[address]       [place] [address]             [id number]        Calden E Waynick [place] Medical Record #[id number] Date of Birth: 11/19/1965  Referring D Bensimhon MD Primary Care: [name], MD Primary Cardiologist:No primary care provider on file.  Chief Complaint:    Chief Complaint  Patient presents with  . Nausea  . Emesis  Patient examined, CTA of chest images personally reviewed and discussed with Dr. Haroldine Laws for coordination of care as well as the patient.  History of Present Illness:     52 year old diabetic male underwent placement of HeartMate 3 June 13, 2017 for ischemic cardiomyopathy.  He had acute on chronic renal failure postop and has been on dialysis.  He underwent colonography at [facility] and laid on his side for the procedure.  Subsequently he developed symptoms of nausea weakness and this evening approximately 7 PM had alarms sound on his controller and presented to the ED.  The patient was evaluated in the ED by Dr. Haroldine Laws and clinical diagnosis of HeartMate 3 outflow graft twist was made in the patient was heparinized and the pump flow turned off.  The patient had pulsatile flow with good blood pressure.  He separately underwent CTA which confirmed the diagnosis of twisted outflow graft with possible thrombus within the graft.  The VAD team feels that the best approach is immediate surgical correction of the problem with either untwisting the pump outflow graft or replacing the pump if necessary.  I discussed the procedure in detail with the patient including the risks and benefits and the potential risks of stroke, bleeding, infection, organ failure, and death.   Current Activity/ Functional Status: Patient has a sedentary functional status on dialysis with chronic nausea and gastroparesis and poor intake   Zubrod Score: At the time of surgery this patient's most appropriate activity status/level  should be described as: []     0    Normal activity, no symptoms []     1    Restricted in physical strenuous activity but ambulatory, able to do out light work []     2    Ambulatory and capable of self care, unable to do work activities, up and about                 more than 50%  Of the time                            [x]     3    Only limited self care, in bed greater than 50% of waking hours []     4    Completely disabled, no self care, confined to bed or chair []     5    Moribund  Past Medical History:  Diagnosis Date  . Angina   . ASCVD (arteriosclerotic cardiovascular disease)    , Anterior infarction 2005, LAD diagonal bifurcation intervention 03/2004  . Automatic implantable cardiac defibrillator -St. Jude's       . Benign neoplasm of colon   . CHF (congestive heart failure) ([place])   . Chronic systolic heart failure ([place])   . Coronary artery disease     Widely patent previously placed stents in the left anterior   . Crohn's disease ([place])   . 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  Dialysis patient ([place])   . Dyspnea   . Gastroparesis   . GERD (gastroesophageal reflux disease)   . High cholesterol   . Hyperlipidemia   . Hypersomnolent    Previous diagnosis of narcolepsy  . Hypertension, essential   . Ischemic cardiomyopathy    Ejection fraction 15-20% catheterization 2010  . Type II or unspecified type diabetes mellitus without mention of complication, not stated as uncontrolled   . Unspecified gastritis and gastroduodenitis without mention of hemorrhage     Past Surgical History:  Procedure Laterality Date  . APPENDECTOMY    . AV FISTULA PLACEMENT Right 06/26/2017   Procedure: ARTERIOVENOUS (AV) FISTULA CREATION VERSUS GRAFT RIGHT  ARM;  Surgeon: Conrad Hickman, MD;  Location: [facility] OR;  Service: Vascular;  Laterality: Right;  . AV FISTULA PLACEMENT Right 10/31/2017   Procedure: INSERTION OF ARTERIOVENOUS (AV) ARTEGRAFT,  RIGHT UPPER ARM;  Surgeon: Conrad [place], MD;  Location: [place];  Service: Vascular;  Laterality: Right;  . CARDIAC DEFIBRILLATOR PLACEMENT  2010   St. Jude ICD  . COLECTOMY  ~ 2003   "for Crohn's" ascending colon.    . CORONARY STENT INTERVENTION N/A 11/25/2016   Procedure: Coronary Stent Intervention;  Surgeon: Leonie Man, MD;  Location: [place] CV LAB;  Service: Cardiovascular;  Laterality: N/A;  . EP IMPLANTABLE DEVICE N/A 09/14/2016   Procedure: ICD Generator Changeout;  Surgeon: Deboraha Sprang, MD;  Location: [place] CV LAB;  Service: Cardiovascular;  Laterality: N/A;  . ESOPHAGOGASTRODUODENOSCOPY N/A 07/12/2017   Procedure: ESOPHAGOGASTRODUODENOSCOPY (EGD);  Surgeon: Jerene Bears, MD;  Location[facility] ENDOSCOPY;  Service: Gastroenterology;  Laterality: N/A;  VAD pt so VAD team will need to accompany pt.   [name] FETAL SURGERY FOR CONGENITAL HERNIA     ????? pt has no knowledge of this..    . INGUINAL HERNIA REPAIR    . INSERTION OF DIALYSIS CATHETER Right 06/26/2017   Procedure: INSERTION OF TUNNELED  DIALYSIS CATHETER RIGHT INTERNAL JUGULAR;  Surgeon: Conrad [place], MD;  Location: [place];  Service: Vascular;  Laterality: Right;  . INSERTION OF IMPLANTABLE LEFT VENTRICULAR ASSIST DEVICE N/A 06/12/2017   Procedure: INSERTION OF IMPLANTABLE LEFT VENTRICULAR ASSIST [place] 3;  Surgeon: Ivin Poot, MD;  Location: [place];  Service: Open Heart Surgery;  Laterality: N/A;  HM3 LVAD  CIRC ARREST  NITRIC OXIDE  . IR REMOVAL TUN CV CATH W/O FL  02/26/2018  . IR THORACENTESIS ASP PLEURAL SPACE W/IMG GUIDE  06/28/2017  . LIGATION OF ARTERIOVENOUS  FISTULA Right 10/31/2017   Procedure: LIGATION OF BRACHIO-BASILIC VEIN TRANSPOSITION RIGHT ARM;  Surgeon: Conrad [place], MD;  Location: [place];  Service: Vascular;  Laterality: Right;  . MULTIPLE EXTRACTIONS WITH ALVEOLOPLASTY N/A 06/08/2017   Procedure: MULTIPLE EXTRACTION WITH ALVEOLOPLASTY AND PRE PROSTHETIC SURGERY AS NEEDED;  Surgeon: Lenn Cal, DDS;  Location:  [place];  Service: Oral Surgery;  Laterality: N/A;  . RIGHT HEART CATH N/A 06/07/2017   Procedure: RIGHT HEART CATH;  Surgeon: Larey Dresser, MD;  Location: [place] CV LAB;  Service: Cardiovascular;  Laterality: N/A;  . RIGHT HEART CATH N/A 02/08/2018   Procedure: RIGHT HEART CATH;  Surgeon: Larey Dresser, MD;  Location: [place] CV LAB;  Service: Cardiovascular;  Laterality: N/A;  . RIGHT/LEFT HEART CATH AND CORONARY ANGIOGRAPHY N/A 11/23/2016   Procedure: Right/Left Heart Cath and Coronary Angiography;  Surgeon: Larey Dresser, MD;  Location: [place] CV LAB;  Service: Cardiovascular;  Laterality: N/A;  . TEE WITHOUT CARDIOVERSION N/A 06/12/2017   Procedure: TRANSESOPHAGEAL ECHOCARDIOGRAM (TEE);  Surgeon: Prescott Gum, Collier Salina, MD;  Location: [place];  Service: Open Heart Surgery;  Laterality: N/A;  . THROMBECTOMY AND REVISION OF ARTERIOVENTOUS (AV) GORETEX  GRAFT Right 12/12/2017   Procedure: THROMBECTOMY ARTERIOVENTOUS (AV) GORETEX  GRAFT RIGHT UPPER ARM;  Surgeon: Conrad [place], MD;  Location: [facility] OR;  Service: Vascular;  Laterality: Right;    Social History   Tobacco Use  Smoking Status Never Smoker  Smokeless Tobacco Never Used    Social History   Substance and Sexual Activity  Alcohol Use No     Allergies  Allergen Reactions  . Metformin And Related Diarrhea    Current Facility-Administered Medications  Medication Dose Route Frequency Provider Last Rate Last Dose  . acetaminophen (TYLENOL) tablet 650 mg  650 mg Oral Q4H PRN Bensimhon, Shaune Pascal, MD      . bisacodyl (DULCOLAX) EC tablet 5 mg  5 mg Oral Once Bensimhon, Shaune Pascal, MD      . cefUROXime (ZINACEF) 1.5 g in sodium chloride 0.9 % 100 mL IVPB  1.5 g Intravenous To OR Prescott Gum, Collier Salina, MD      . cefUROXime (ZINACEF) 750 mg in sodium chloride 0.9 % 100 mL IVPB  750 mg Intravenous To OR Prescott Gum, Collier Salina, MD      . chlorhexidine (PERIDEX) 0.12 % solution 15 mL  15 mL Mouth/Throat Once Bensimhon, Shaune Pascal, MD      .  Chlorhexidine Gluconate Cloth 2 % PADS 6 each  6 each Topical Once Bensimhon, Shaune Pascal, MD      . dexmedetomidine (PRECEDEX) 400 MCG/100ML (4 mcg/mL) infusion  0.1-0.7 mcg/kg/hr Intravenous To OR Prescott Gum, Collier Salina, MD      . DOBUTamine (DOBUTREX) infusion 4000 mcg/mL  2-10 mcg/kg/min Intravenous To OR Prescott Gum, Collier Salina, MD      . DOPamine (INTROPIN) 800 mg in dextrose 5 % 250 mL (3.2 mg/mL) infusion  0-10 mcg/kg/min Intravenous To OR Prescott Gum, Collier Salina, MD      . EPINEPHrine (ADRENALIN) 4 mg in dextrose 5 % 250 mL (0.016 mg/mL) infusion  0-10 mcg/min Intravenous To OR Prescott Gum, Collier Salina, MD      . fluconazole (DIFLUCAN) IVPB 400 mg  400 mg Intravenous To OR Prescott Gum, Collier Salina, MD      . heparin 30,000 units/NS 1000 mL solution for CELLSAVER   Other To OR Prescott Gum, Collier Salina, MD      . heparin ADULT infusion 100 units/mL (25000 units/286m sodium chloride 0.45%)  1,400 Units/hr Intravenous Continuous Bensimhon, DShaune Pascal MD 14 mL/hr at 02/28/18 2250 1,400 Units/hr at 02/28/18 2250  . insulin aspart (novoLOG) injection 0-15 Units  0-15 Units Subcutaneous TID WC Bensimhon, DShaune Pascal MD      . insulin aspart (novoLOG) injection 0-5 Units  0-5 Units Subcutaneous QHS Bensimhon, DShaune Pascal MD      . insulin regular (NOVOLIN R,HUMULIN R) 100 Units in sodium chloride 0.9 % 100 mL (1 Units/mL) infusion   Intravenous To OR VPrescott Gum PCollier Salina MD      . magnesium sulfate (IV Push/IM) injection 40 mEq  40 mEq Other To OR VPrescott Gum PCollier Salina MD      . milrinone (PRIMACOR) 20 MG/100 ML (0.2 mg/mL) infusion  0.3 mcg/kg/min Intravenous To OR VPrescott Gum PCollier Salina MD      . mupirocin ointment (BACTROBAN) 2 % 1 application  1 application Nasal BID Bensimhon, DShaune Pascal MD      .  nitroGLYCERIN 50 mg in dextrose 5 % 250 mL (0.2 mg/mL) infusion  0-100 mcg/min Intravenous To OR Prescott Gum, Collier Salina, MD      . norepinephrine (LEVOPHED) 27m in D5W 2541mpremix infusion  0-12 mcg/min Intravenous To OR VaPrescott GumPeCollier SalinaMD      . ondansetron ([facility] 4 mg  4 mg Intravenous Q6H PRN Bensimhon, DaShaune PascalMD      . phenylephrine (NEO-SYNEPHRINE) 20 mg in sodium chloride 0.9 % 250 mL (0.08 mg/mL) infusion  0-100 mcg/min Intravenous To OR VaPrescott GumPeCollier SalinaMD      . potassium chloride injection 80 mEq  80 mEq Other To OR VaPrescott GumPeCollier SalinaMD      . promethazine (PHENERGAN) injection 25 mg  25 mg Intravenous Q6H PRN Bensimhon, DaShaune PascalMD   25 mg at 03/01/18 0029  . rifampin (RIFADIN) 600 mg in sodium chloride 0.9 % 100 mL IVPB  600 mg Intravenous To OR VaPrescott GumPeCollier SalinaMD      . temazepam (RESTORIL) capsule 15 mg  15 mg Oral Once PRN Bensimhon, DaShaune PascalMD      . tranexamic acid (CYKLOKAPRON) 2,500 mg in sodium chloride 0.9 % 250 mL (10 mg/mL) infusion  1.5 mg/kg/hr Intravenous To OR VaPrescott GumPeCollier SalinaMD      . tranexamic acid (CYKLOKAPRON) bolus via infusion - over 30 minutes 1,200 mg  15 mg/kg Intravenous To OR VaPrescott GumPeCollier SalinaMD      . tranexamic acid (CYKLOKAPRON) pump prime solution 160 mg  2 mg/kg Intracatheter To OR VaPrescott GumPeCollier SalinaMD      . vancomycin (VANCOCIN) 1,250 mg in sodium chloride 0.9 % 250 mL IVPB  1,250 mg Intravenous To OR VaPrescott GumPeCollier SalinaMD      . vancomycin ([facility]Frederick Memorial Hospitalpowder 1,000 mg  1,000 mg Other To OR VaPrescott GumPeCollier SalinaMD      . vasopressin (PITRESSIN) 40 Units in sodium chloride 0.9 % 250 mL (0.16 Units/mL) infusion  0.04 Units/min Intravenous To OR VaPrescott GumPeCollier SalinaMD        Medications Prior to Admission  Medication Sig Dispense Refill Last Dose  . amLODipine (NORVASC) 2.5 MG tablet Take 1 tablet (2.5 mg total) by mouth daily. 180 tablet 3 02/07/2018 at Unknown time  . aspirin EC 81 MG tablet Take 1 tablet (81 mg total) by mouth daily. 90 tablet 3 02/07/2018 at 0800  . atorvastatin (LIPITOR) 40 MG tablet Take 1 tablet (40 mg total) by mouth daily at 6 PM. 30 tablet 6 02/07/2018 at Unknown time  . docusate sodium (COLACE) 100 MG capsule Take 2 capsules (200 mg total) by mouth daily. (Patient taking  differently: Take 100 mg by mouth 2 (two) times daily. ) 10 capsule 0 Past Month at Unknown time  . dronabinol (MARINOL) 2.5 MG capsule Take 1 capsule (2.5 mg total) by mouth 2 (two) times daily before lunch and supper. 60 capsule 5 02/07/2018 at Unknown time  . hydrALAZINE (APRESOLINE) 25 MG tablet Take 1.5 tablets (37.5 mg total) by mouth 3 (three) times daily. Take on non-hd days 135 tablet 3 02/07/2018 at Unknown time  . insulin aspart (NOVOLOG) 100 UNIT/ML injection Inject 5 Units into the skin 3 (three) times daily with meals. 1 vial 0 02/07/2018 at Unknown time  . insulin glargine (LANTUS) 100 UNIT/ML injection Inject 0.1 mLs (10 Units total) into the skin daily.   02/07/2018 at Unknown time  . LORazepam (ATIVAN) 0.5 MG tablet  Take 1 tablet (0.5 mg total) by mouth every 12 (twelve) hours as needed for anxiety or sleep. 30 tablet 0 Past Month at Unknown time  . metoCLOPramide (REGLAN) 10 MG tablet Take 1 tablet (10 mg total) by mouth 3 (three) times daily. 90 tablet 6 02/07/2018 at Unknown time  . ondansetron (ZOFRAN ODT) 4 MG disintegrating tablet Take 1 tablet (4 mg total) by mouth every 6 (six) hours as needed for nausea or vomiting. 40 tablet 1 Past Month at Unknown time  . pantoprazole (PROTONIX) 40 MG tablet Take 40 mg by mouth 2 (two) times daily.    02/07/2018 at Unknown time  . promethazine (PHENERGAN) 12.5 MG tablet Take 1 tablet (12.5 mg total) by mouth every 6 (six) hours as needed for nausea or vomiting. 30 tablet 0 Past Month at Unknown time  . sildenafil (REVATIO) 20 MG tablet Take 1 tablet (20 mg total) by mouth 3 (three) times daily. 90 tablet 6 02/07/2018 at Unknown time  . warfarin (COUMADIN) 5 MG tablet Take 1 tablet (5 mg total) by mouth daily at 6 PM. 30 tablet 6 02/07/2018 at 2100    Family History  Problem Relation Age of Onset  . Colon polyps Mother   . Diabetes Mother   . Heart attack Father   . Diabetes Father   . Stroke Paternal Grandfather   . Colitis Sister   . Heart disease  Brother   . Colitis Sister   . Colon cancer Neg Hx      Review of Systems:   ROS      Cardiac Review of Systems: Y or  [    ]= no  Chest Pain [    ]  Resting SOB [   ] Exertional SOB  [  ]  Orthopnea [  ]   Pedal Edema [   ]    Palpitations [  ] Syncope  [  ]   Presyncope [   ]  General Review of Systems: [Y] = yes [  ]=no Constitional: recent weight change [  ]; Bary Leriche Blue.Reese  ]; fatigue [ y ]; nausea [ y ]; night sweats [  ]; fever [  ]; or chills [  ]                                                               Dental: Last Dentist visit:   Eye : blurred vision [  ]; diplopia [   ]; vision changes [  ];  Amaurosis fugax[  ]; Resp: cough [  ];  wheezing[  ];  hemoptysis[  ]; shortness of breath[  ]; paroxysmal nocturnal dyspnea[  ]; dyspnea on exertion[  ]; or orthopnea[  ];  GI:  gallstones[  ], vomiting[  ];  dysphagia[  ]; melena[  ];  hematochezia [  ]; heartburn[  ];   Hx of  Colonoscopy[  ]; GU: kidney stones [  ]; hematuria[  ];   dysuria [  ];  nocturia[  ];  history of     obstruction [  ]; urinary frequency [  ]             Skin: rash, swelling[  ];, hair loss[  ];  peripheral edema[  ];  or itching[  ];  Musculosketetal: myalgias[ y ];  joint swelling[  ];  joint erythema[  ];  joint pain[y  ];  back pain[  ];  Heme/Lymph: bruising[  ];  bleeding[  ];  anemia[  ];  Neuro: TIA[  ];  headaches[  ];  stroke[  ];  vertigo[  ];  seizures[  ];   paresthesias[  ];  difficulty walking[  ];  Psych:depression[  ]; anxiety[  ];  Endocrine: diabetes[ y ];  thyroid dysfunction[  ];            Physical Exam: BP (!) 147/90   Pulse (!) 104   Temp (!) 96.6 F (35.9 C) (Temporal)   Resp 19   Ht 5' 7"  (1.702 m)   Wt 166 lb 3.6 oz (75.4 kg)   SpO2 98%   BMI 26.03 kg/m        Exam    General-thin chronically ill-appearing middle-aged AA male, no complaint of pain but he is nauseated    Neck- no JVD, no cervical adenopathy palpable, no carotid bruit.  Carotid pulses present.    Lungs- clear without rales, wheezes   Cor- regular rate and rhythm, no murmur , positive VAD hum unremarkable   Abdomen- soft, non-tender   Extremities - warm, non-tender, minimal edema AV dialysis access graft right arm   Neuro- oriented, appropriate, no focal weakness     Diagnostic Studies & Laboratory data:     Recent Radiology Findings:   Ct Chest W Contrast  Result Date: 02/28/2018 CLINICAL DATA:  52 year old male with concern for LVAD dysfunction. EXAM: CT CHEST WITH CONTRAST TECHNIQUE: Multidetector CT imaging of the chest was performed during intravenous contrast administration. CONTRAST:  58m OMNIPAQUE IOHEXOL 300 MG/ML  SOLN COMPARISON:  Chest radiograph dated 01/19/2018 and CT dated 06/27/2017 FINDINGS: Evaluation is limited due to streak artifact caused by pacemaker and LVAD hardware. Cardiovascular: There is moderate cardiomegaly. No pericardial effusion. Multi vessel coronary vascular calcification. Left pectoral pacemaker device is noted. There is a LVAD device. The inflow cannula is along the long axis of the left ventricle. The outflow cannula extends to the upper ascending aorta. Evaluation of the inflow cannula is limited due to severe streak artifacts. There is non opacification of the outflow cannula limiting evaluation of lumen. However, underlying occlusion of the tract is not excluded. There may be a focal area of kinking of the outflow cannula in the anterior chest (sagittal series 6, image 68). Evaluation of this area is dated due to streak artifact. There is retrograde opacification of the distal outflow cannula at the junction of the SVC. Focal area of filling defect in the distal outflow cannula (series 3 images 59-63) may be related to mixing artifact, although a clot is not excluded. There is opacification of the aorta directly from the left ventricle. There is no aortic aneurysm or dissection. The origins of the great vessels of the aortic arch are patent. There is a  nonocclusive thrombus in the SVC. The central pulmonary arteries are patent as visualized. Evaluation of the pulmonary artery branches is limited due to suboptimal opacification and timing of the contrast as well as streak artifact caused by LVAD. Mediastinum/Nodes: There is no hilar or mediastinal adenopathy. There is a small hiatal hernia. The esophagus and thyroid gland are grossly unremarkable. Lungs/Pleura: There are linear atelectasis/scarring. There is no focal consolidation, pleural effusion, or pneumothorax. The central airways are patent. Upper Abdomen: Probable mild fatty infiltration of the liver. The visualized upper abdomen is otherwise  unremarkable. Musculoskeletal: Median sternotomy wires. No acute osseous pathology. IMPRESSION: 1. LVAD device with possible focal kinking of the outflow cannula. There is non opacification of the outflow cannula limiting the evaluation. Underlying thrombus or occlusion of the outflow cannula is not excluded. The aorta is perfused directly via the left ventricle. 2. Nonocclusive clot in the SVC. 3. Other findings as above. The LVAD findings were discussed in person with Dr. Haroldine Laws and the SVC clot was reported to Dr. Dayna Barker by telephone at the time of interpretation on 02/28/2018 at 11:19 pm. Electronically Signed   By: Anner Crete M.D.   On: 02/28/2018 23:23     I have independently reviewed the above radiologic studies and discussed with the patient   Recent Lab Findings: Lab Results  Component Value Date   WBC 16.5 (H) 02/28/2018   HGB 12.3 (L) 02/28/2018   HCT 39.4 02/28/2018   PLT 191 02/28/2018   GLUCOSE 150 (H) 02/28/2018   CHOL 128 05/04/2017   TRIG 118 05/04/2017   HDL 50 05/04/2017   LDLCALC 54 05/04/2017   ALT 49 (H) 02/28/2018   AST 46 (H) 02/28/2018   NA 133 (L) 02/28/2018   K 4.4 02/28/2018   CL 93 (L) 02/28/2018   CREATININE 3.98 (H) 02/28/2018   BUN 17 02/28/2018   CO2 21 (L) 02/28/2018   TSH 2.353 05/04/2017   INR 1.24  02/28/2018   HGBA1C 6.1 (H) 09/13/2017      Assessment / Plan:   Patient was admitted to the ICU and prepared for emergency redo sternotomy and expiration of HeartMate 3 to correct the condition of low flow which is believed to be from either twisting of the outflow graft or thrombosis of the entire pump..  I discussed the procedure of redo incision and sternal reexploration of the mediastinum for correction or replacement of the HeartMate 3 LVAD.  Patient understands risks alternatives and fact this is a high risk procedure with significant risk of bleeding blood transfusion, stroke infection organ failure and death.   @ME1 @ 03/01/2018 12:39 AM

## 2018-03-01 NOTE — Procedures (Signed)
Central Venous Trialysis Catheter Insertion Procedure Note [name] [id number] 1966/07/25  Procedure: Insertion of Central Venous Catheter Indications: Hemodialysis  Procedure Details Consent: Risks of procedure as well as the alternatives and risks of each were explained to the (patient/caregiver).  Consent for procedure obtained. Time Out: Verified patient identification, verified procedure, site/side was marked, verified correct patient position, special equipment/implants available, medications/allergies/relevent history reviewed, required imaging and test results available.  Performed  Maximum sterile technique was used including antiseptics, cap, gloves, gown, hand hygiene, mask and sheet. Skin prep: Chlorhexidine; local anesthetic administered A antimicrobial bonded/coated triple lumen trialysis catheter was placed in the left internal jugular vein using the Seldinger technique and real-time u/s guidance..  Evaluation Blood flow good Complications: No apparent complications Patient did tolerate procedure well. Chest X-ray ordered to verify placement.  CXR: normal.  [name] Bickers MD 03/01/2018, 6:45 PM

## 2018-03-01 NOTE — Progress Notes (Signed)
  Echocardiogram Echocardiogram Transesophageal has been performed.  Johny Chess 03/01/2018, 1:40 AM

## 2018-03-01 NOTE — Progress Notes (Signed)
Nitric Oxide weaning initiated. Pt is currently on 15ppm of NO therapy.

## 2018-03-01 NOTE — Anesthesia Procedure Notes (Signed)
Central Venous Catheter Insertion Performed by: Lillia Abed, MD, anesthesiologist Start/End6/27/2019 1:05 AM, 03/01/2018 1:15 AM Patient location: Pre-op. Preanesthetic checklist: patient identified, IV checked, risks and benefits discussed, surgical consent, monitors and equipment checked, pre-op evaluation, timeout performed and anesthesia consent Position: Trendelenburg Lidocaine 1% used for infiltration and patient sedated Hand hygiene performed  and maximum sterile barriers used  Catheter size: 8.5 Fr PA cath was placed.MAC introducer Swan type:thermodilution Procedure performed using ultrasound guided technique. Ultrasound Notes:anatomy identified, needle tip was noted to be adjacent to the nerve/plexus identified, no ultrasound evidence of intravascular and/or intraneural injection and image(s) printed for medical record Attempts: 1 Following insertion, line sutured, dressing applied and Biopatch. Post procedure assessment: blood return through all ports, free fluid flow and no air  Patient tolerated the procedure well with no immediate complications.

## 2018-03-01 NOTE — Brief Op Note (Addendum)
02/28/2018 - 03/01/2018  7:30 AM  PATIENT:  [name]  52 y.o. male  PRE-OPERATIVE DIAGNOSIS:  Heart Mate 3 pump thrombus, non-functional  POST-OPERATIVE DIAGNOSIS:   Heart Mate 3 pump thrombus,non functional  PROCEDURE: TRANSESOPHAGEAL ECHOCARDIOGRAM, (TEE)  REDO MEDIAN STERNOTOMY for Pump Exchange OF IMPLANTABLE LEFT VENTRICULAR ASSIST DEVICE - HEARTMATE 3   SURGEON:  Surgeon(s) and Role:    Ivin Poot, MD - Primary  PHYSICIAN ASSISTANT: Lars Pinks PA-C  ASSISTANTS: Vernie Murders RNFA   ANESTHESIA:   general  EBL:  300 mL   BLOOD ADMINISTERED:Three CC PRBC, two FFP and two PLTS  DRAINS: Chest tubes placed in the mediastinal and pleural spaces   COUNTS CORRECT:  YES  DICTATION: .Dragon Dictation  PLAN OF CARE: Admit to inpatient   PATIENT DISPOSITION:  ICU - intubated and hemodynamically stable.   Delay start of Pharmacological VTE agent (>24hrs) due to surgical blood loss or risk of bleeding: not applicable

## 2018-03-01 NOTE — Anesthesia Procedure Notes (Signed)
Arterial Line Insertion Start/End6/27/2019 1:15 AM, 03/01/2018 1:20 AM Performed by: Lillia Abed, MD, anesthesiologist  Preanesthetic checklist: patient identified, IV checked, risks and benefits discussed, surgical consent, monitors and equipment checked, pre-op evaluation, timeout performed and anesthesia consent Patient sedated Left, radial was placed Catheter size: 20 G Hand hygiene performed , maximum sterile barriers used  and Seldinger technique used Allen's test indicative of satisfactory collateral circulation Attempts: 1 Procedure performed without using ultrasound guided technique. Following insertion, dressing applied and Biopatch. Post procedure assessment: normal  Patient tolerated the procedure well with no immediate complications.

## 2018-03-01 NOTE — OR Nursing (Signed)
SICU charge nurse calls made at 07:15 and [phone]

## 2018-03-02 ENCOUNTER — Encounter (HOSPITAL_COMMUNITY): Payer: Self-pay | Admitting: Cardiothoracic Surgery

## 2018-03-02 ENCOUNTER — Inpatient Hospital Stay (HOSPITAL_COMMUNITY): Payer: BLUE CROSS/BLUE SHIELD

## 2018-03-02 DIAGNOSIS — Z95811 Presence of heart assist device: Secondary | ICD-10-CM

## 2018-03-02 LAB — CBC WITH DIFFERENTIAL/PLATELET
Abs Immature Granulocytes: 0.2 10*3/uL — ABNORMAL HIGH (ref 0.0–0.1)
Basophils Absolute: 0 10*3/uL (ref 0.0–0.1)
Basophils Relative: 0 %
Eosinophils Absolute: 0.1 10*3/uL (ref 0.0–0.7)
Eosinophils Relative: 1 %
HCT: 30.2 % — ABNORMAL LOW (ref 39.0–52.0)
Hemoglobin: 10.2 g/dL — ABNORMAL LOW (ref 13.0–17.0)
Immature Granulocytes: 1 %
Lymphocytes Relative: 5 %
Lymphs Abs: 0.9 10*3/uL (ref 0.7–4.0)
MCH: 29.3 pg (ref 26.0–34.0)
MCHC: 33.8 g/dL (ref 30.0–36.0)
MCV: 86.8 fL (ref 78.0–100.0)
Monocytes Absolute: 1.2 10*3/uL — ABNORMAL HIGH (ref 0.1–1.0)
Monocytes Relative: 7 %
Neutro Abs: 13.4 10*3/uL — ABNORMAL HIGH (ref 1.7–7.7)
Neutrophils Relative %: 86 %
Platelets: 111 10*3/uL — ABNORMAL LOW (ref 150–400)
RBC: 3.48 MIL/uL — ABNORMAL LOW (ref 4.22–5.81)
RDW: 16 % — ABNORMAL HIGH (ref 11.5–15.5)
WBC: 15.7 10*3/uL — ABNORMAL HIGH (ref 4.0–10.5)

## 2018-03-02 LAB — POCT I-STAT 3, ART BLOOD GAS (G3+)
ACID-BASE DEFICIT: 2 mmol/L (ref 0.0–2.0)
Acid-base deficit: 2 mmol/L (ref 0.0–2.0)
Acid-base deficit: 3 mmol/L — ABNORMAL HIGH (ref 0.0–2.0)
BICARBONATE: 23.3 mmol/L (ref 20.0–28.0)
BICARBONATE: 23.6 mmol/L (ref 20.0–28.0)
Bicarbonate: 22.5 mmol/L (ref 20.0–28.0)
O2 Saturation: 100 %
O2 Saturation: 100 %
O2 Saturation: 99 %
PCO2 ART: 34.6 mmHg (ref 32.0–48.0)
PCO2 ART: 45.1 mmHg (ref 32.0–48.0)
PH ART: 7.326 — AB (ref 7.350–7.450)
PO2 ART: 199 mmHg — AB (ref 83.0–108.0)
PO2 ART: 178 mmHg — AB (ref 83.0–108.0)
PO2 ART: 169 mmHg — AB (ref 83.0–108.0)
Patient temperature: 35.5
Patient temperature: 35.8
TCO2: 24 mmol/L (ref 22–32)
TCO2: 25 mmol/L (ref 22–32)
TCO2: 25 mmol/L (ref 22–32)
pCO2 arterial: 40.6 mmHg (ref 32.0–48.0)
pH, Arterial: 7.414 (ref 7.350–7.450)
pH, Arterial: 7.361 (ref 7.350–7.450)

## 2018-03-02 LAB — RENAL FUNCTION PANEL
ALBUMIN: 2.6 g/dL — AB (ref 3.5–5.0)
Anion gap: 8 (ref 5–15)
BUN: 10 mg/dL (ref 6–20)
CHLORIDE: 105 mmol/L (ref 98–111)
CO2: 25 mmol/L (ref 22–32)
CREATININE: 2.82 mg/dL — AB (ref 0.61–1.24)
Calcium: 8 mg/dL — ABNORMAL LOW (ref 8.9–10.3)
GFR calc Af Amer: 28 mL/min — ABNORMAL LOW (ref >60)
GFR calc non Af Amer: 24 mL/min — ABNORMAL LOW (ref >60)
Glucose, Bld: 190 mg/dL — ABNORMAL HIGH (ref 70–99)
PHOSPHORUS: 3.3 mg/dL (ref 2.5–4.6)
Potassium: 4.3 mmol/L (ref 3.5–5.1)
Sodium: 138 mmol/L (ref 135–145)

## 2018-03-02 LAB — BPAM PLATELET PHERESIS
Blood Product Expiration Date: 201906272359
Blood Product Expiration Date: 201906272359
ISSUE DATE / TIME: 201906270551
ISSUE DATE / TIME: 201906270551
Unit Type and Rh: 6200
Unit Type and Rh: 7300

## 2018-03-02 LAB — COMPREHENSIVE METABOLIC PANEL
ALT: 16 U/L (ref 0–44)
AST: 33 U/L (ref 15–41)
Albumin: 3 g/dL — ABNORMAL LOW (ref 3.5–5.0)
Alkaline Phosphatase: 54 U/L (ref 38–126)
Anion gap: 13 (ref 5–15)
BUN: 15 mg/dL (ref 6–20)
CO2: 20 mmol/L — ABNORMAL LOW (ref 22–32)
Calcium: 8.1 mg/dL — ABNORMAL LOW (ref 8.9–10.3)
Chloride: 104 mmol/L (ref 98–111)
Creatinine, Ser: 3.31 mg/dL — ABNORMAL HIGH (ref 0.61–1.24)
GFR calc Af Amer: 23 mL/min — ABNORMAL LOW (ref >60)
GFR calc non Af Amer: 20 mL/min — ABNORMAL LOW (ref >60)
Glucose, Bld: 166 mg/dL — ABNORMAL HIGH (ref 70–99)
Potassium: 4.4 mmol/L (ref 3.5–5.1)
Sodium: 137 mmol/L (ref 135–145)
Total Bilirubin: 2.5 mg/dL — ABNORMAL HIGH (ref 0.3–1.2)
Total Protein: 5 g/dL — ABNORMAL LOW (ref 6.5–8.1)

## 2018-03-02 LAB — BPAM FFP
Blood Product Expiration Date: 201906302359
Blood Product Expiration Date: 201906302359
ISSUE DATE / TIME: 201906270553
ISSUE DATE / TIME: 201906270553
Unit Type and Rh: 600
Unit Type and Rh: 6200

## 2018-03-02 LAB — PREPARE FRESH FROZEN PLASMA: Unit division: 0

## 2018-03-02 LAB — BPAM CRYOPRECIPITATE
BLOOD PRODUCT EXPIRATION DATE: 201906271222
Blood Product Expiration Date: 201906271222
ISSUE DATE / TIME: 201906270647
ISSUE DATE / TIME: 201906270647
UNIT TYPE AND RH: 5100
UNIT TYPE AND RH: 5100

## 2018-03-02 LAB — GLUCOSE, CAPILLARY
GLUCOSE-CAPILLARY: 124 mg/dL — AB (ref 70–99)
GLUCOSE-CAPILLARY: 157 mg/dL — AB (ref 70–99)
GLUCOSE-CAPILLARY: 141 mg/dL — AB (ref 70–99)
GLUCOSE-CAPILLARY: 115 mg/dL — AB (ref 70–99)
Glucose-Capillary: 132 mg/dL — ABNORMAL HIGH (ref 70–99)
Glucose-Capillary: 127 mg/dL — ABNORMAL HIGH (ref 70–99)
Glucose-Capillary: 135 mg/dL — ABNORMAL HIGH (ref 70–99)
Glucose-Capillary: 158 mg/dL — ABNORMAL HIGH (ref 70–99)
Glucose-Capillary: 111 mg/dL — ABNORMAL HIGH (ref 70–99)
Glucose-Capillary: 113 mg/dL — ABNORMAL HIGH (ref 70–99)
Glucose-Capillary: 158 mg/dL — ABNORMAL HIGH (ref 70–99)
Glucose-Capillary: 177 mg/dL — ABNORMAL HIGH (ref 70–99)
Glucose-Capillary: 98 mg/dL (ref 70–99)

## 2018-03-02 LAB — PREPARE PLATELET PHERESIS
Unit division: 0
Unit division: 0

## 2018-03-02 LAB — POCT I-STAT, CHEM 8
BUN: 10 mg/dL (ref 6–20)
CHLORIDE: 101 mmol/L (ref 98–111)
Calcium, Ion: 1.14 mmol/L — ABNORMAL LOW (ref 1.15–1.40)
Creatinine, Ser: 2.7 mg/dL — ABNORMAL HIGH (ref 0.61–1.24)
Glucose, Bld: 193 mg/dL — ABNORMAL HIGH (ref 70–99)
HEMATOCRIT: 25 % — AB (ref 39.0–52.0)
Hemoglobin: 8.5 g/dL — ABNORMAL LOW (ref 13.0–17.0)
POTASSIUM: 4.3 mmol/L (ref 3.5–5.1)
SODIUM: 137 mmol/L (ref 135–145)
TCO2: 24 mmol/L (ref 22–32)

## 2018-03-02 LAB — PREPARE CRYOPRECIPITATE
UNIT DIVISION: 0
Unit division: 0

## 2018-03-02 LAB — CBC
HCT: 25.6 % — ABNORMAL LOW (ref 39.0–52.0)
Hemoglobin: 8.5 g/dL — ABNORMAL LOW (ref 13.0–17.0)
MCH: 29.3 pg (ref 26.0–34.0)
MCHC: 33.2 g/dL (ref 30.0–36.0)
MCV: 88.3 fL (ref 78.0–100.0)
Platelets: 78 10*3/uL — ABNORMAL LOW (ref 150–400)
RBC: 2.9 MIL/uL — ABNORMAL LOW (ref 4.22–5.81)
RDW: 16.4 % — ABNORMAL HIGH (ref 11.5–15.5)
WBC: 11.4 10*3/uL — ABNORMAL HIGH (ref 4.0–10.5)

## 2018-03-02 LAB — CALCIUM, IONIZED: Calcium, Ionized, Serum: 4 mg/dL — ABNORMAL LOW (ref 4.5–5.6)

## 2018-03-02 LAB — MAGNESIUM: Magnesium: 2.1 mg/dL (ref 1.7–2.4)

## 2018-03-02 LAB — COOXEMETRY PANEL
Carboxyhemoglobin: 1.1 % (ref 0.5–1.5)
Methemoglobin: 2.1 % — ABNORMAL HIGH (ref 0.0–1.5)
O2 SAT: 75.3 %
TOTAL HEMOGLOBIN: 10.3 g/dL — AB (ref 12.0–16.0)

## 2018-03-02 LAB — BRAIN NATRIURETIC PEPTIDE: B Natriuretic Peptide: 694.1 pg/mL — ABNORMAL HIGH (ref 0.0–100.0)

## 2018-03-02 LAB — PROTIME-INR
INR: 1.7
PROTHROMBIN TIME: 19.8 s — AB (ref 11.4–15.2)

## 2018-03-02 LAB — LACTATE DEHYDROGENASE: LDH: 384 U/L — AB (ref 98–192)

## 2018-03-02 LAB — APTT: APTT: 37 s — AB (ref 24–36)

## 2018-03-02 MED ORDER — INSULIN GLARGINE 100 UNIT/ML ~~LOC~~ SOLN
12.0000 [IU] | Freq: Every day | SUBCUTANEOUS | Status: DC
  Administered 2018-03-02: 12 [IU] via SUBCUTANEOUS
  Filled 2018-03-02: qty 0.12

## 2018-03-02 MED ORDER — CHLORHEXIDINE GLUCONATE CLOTH 2 % EX PADS: 6.0000 | MEDICATED_PAD | Freq: Every day | CUTANEOUS | Status: DC

## 2018-03-02 MED ORDER — MILRINONE LACTATE IN DEXTROSE 20-5 MG/100ML-% IV SOLN
0.1250 ug/kg/min | INTRAVENOUS | Status: AC
  Administered 2018-03-03 – 2018-03-04 (×3): 0.25 ug/kg/min via INTRAVENOUS
  Administered 2018-03-05 – 2018-03-07 (×3): 0.125 ug/kg/min via INTRAVENOUS
  Filled 2018-03-02 (×6): qty 100

## 2018-03-02 MED ORDER — SODIUM CHLORIDE 0.9% FLUSH: 10.0000 mL | INTRAVENOUS | Status: DC | PRN

## 2018-03-02 MED ORDER — WARFARIN - PHYSICIAN DOSING INPATIENT
Freq: Every day | Status: DC
  Administered 2018-03-02: 17:00:00

## 2018-03-02 MED ORDER — WARFARIN SODIUM 2 MG PO TABS
4.0000 mg | ORAL_TABLET | Freq: Every day | ORAL | Status: DC
  Administered 2018-03-02: 4 mg via ORAL
  Filled 2018-03-02: qty 2

## 2018-03-02 MED ORDER — ALBUMIN HUMAN 25 % IV SOLN
12.5000 g | Freq: Four times a day (QID) | INTRAVENOUS | Status: AC
  Administered 2018-03-03 (×2): 12.5 g via INTRAVENOUS
  Filled 2018-03-02 (×2): qty 50

## 2018-03-02 MED ORDER — SODIUM CHLORIDE 0.9% FLUSH
10.0000 mL | Freq: Two times a day (BID) | INTRAVENOUS | Status: DC
  Administered 2018-03-02 – 2018-03-03 (×2): 10 mL
  Administered 2018-03-03: 20 mL
  Administered 2018-03-04 – 2018-03-09 (×10): 10 mL
  Administered 2018-03-10: 30 mL
  Administered 2018-03-10 – 2018-03-11 (×2): 10 mL
  Administered 2018-03-11 – 2018-03-12 (×2): 30 mL
  Administered 2018-03-12 – 2018-03-18 (×7): 10 mL

## 2018-03-02 MED ORDER — INSULIN ASPART 100 UNIT/ML ~~LOC~~ SOLN
0.0000 [IU] | SUBCUTANEOUS | Status: DC
  Administered 2018-03-02 (×2): 2 [IU] via SUBCUTANEOUS
  Administered 2018-03-03: 1 [IU] via SUBCUTANEOUS
  Administered 2018-03-03: 2 [IU] via SUBCUTANEOUS
  Administered 2018-03-04 – 2018-03-05 (×4): 1 [IU] via SUBCUTANEOUS
  Administered 2018-03-05: 2 [IU] via SUBCUTANEOUS
  Administered 2018-03-05: 1 [IU] via SUBCUTANEOUS
  Administered 2018-03-05: 2 [IU] via SUBCUTANEOUS
  Administered 2018-03-05: 1 [IU] via SUBCUTANEOUS
  Administered 2018-03-06: 2 [IU] via SUBCUTANEOUS
  Administered 2018-03-06: 1 [IU] via SUBCUTANEOUS
  Administered 2018-03-06: 3 [IU] via SUBCUTANEOUS
  Administered 2018-03-06: 1 [IU] via SUBCUTANEOUS
  Administered 2018-03-07: 2 [IU] via SUBCUTANEOUS
  Administered 2018-03-07 (×2): 1 [IU] via SUBCUTANEOUS

## 2018-03-02 NOTE — Plan of Care (Signed)
  Problem: Coping: Goal: Level of anxiety will decrease Outcome: Progressing   Problem: Pain Managment: Goal: General experience of comfort will improve Outcome: Progressing   

## 2018-03-02 NOTE — Progress Notes (Signed)
INO therapy weaned to 10ppm per MD order for weaning Nitric. PAP/CVP are stable. RN at bedside attentive to patient needs/cares. RRT will continue to monitor patient clinical status.

## 2018-03-02 NOTE — Progress Notes (Signed)
Advanced Heart Failure Rounding Note  PCP-Cardiologist: No primary care provider on file.   Subjective:    Extubated earlier today and actually stood at side of bed. Now back in bed. Feels weak. Remains on milrinone 0.25. NE 4 and Neo 30. On CVVHD pulling -75/hr. CVP 7-8  Denies dyspnea, orthopnea or PND. Nausea controlled. No flatus yet.   No significant bleeding. Hgb stable 8.5    Swan #s CVP 7 PA 39/12 Thermo 9.5/5.1  Flow 3.9 Speed 5200  PI 3.1 Power 4.0  VAD interrogated personally. Parameters stable.   Objective:   Weight Range: 83 kg (182 lb 15.7 oz) Body mass index is 28.66 kg/m.   Vital Signs:   Temp:  [95.5 F (35.3 C)-99.5 F (37.5 C)] 99.5 F (37.5 C) (06/28 2100) Pulse Rate:  [90-130] 126 (06/28 2100) Resp:  [14-33] 29 (06/28 2100) BP: (73-128)/(37-104) 106/74 (06/28 2100) SpO2:  [98 %-100 %] 100 % (06/28 2100) Arterial Line BP: (65-125)/(49-70) 100/56 (06/28 2100) FiO2 (%):  [45 %-50 %] 45 % (06/28 1112) Weight:  [83 kg (182 lb 15.7 oz)-87.6 kg (193 lb 2 oz)] 83 kg (182 lb 15.7 oz) (06/28 1810)    Weight change: Filed Weights   03/01/18 0030 03/02/18 0609 03/02/18 1810  Weight: 75.4 kg (166 lb 3.6 oz) 87.6 kg (193 lb 2 oz) 83 kg (182 lb 15.7 oz)    Intake/Output:   Intake/Output Summary (Last 24 hours) at 03/02/2018 2310 Last data filed at 03/02/2018 2200 Gross per 24 hour  Intake 2180.95 ml  Output 4023 ml  Net -1842.05 ml      Physical Exam    General:  Lying flat in bed Weak appearing NAD HEENT: normal anicteric Neck: supple. JVP not elevated. RIJ swan. LIJ trialysis Carotids 2+ bilat; no bruits. No lymphadenopathy or thryomegaly appreciated. Cor: LVAD hum.  Sternal dressing ok. +CTs Lungs: Coarse Abdomen: soft, nontender, minimally distended. Hypoactive BSDriveline site clean. Anchor in place.  Extremities: no cyanosis, clubbing, rash. Warm no edema  Neuro: alert & oriented x 3. No focal deficits. Moves all 4 without problem     Telemetry   Sinus tach 110-120s Personally reviewed  Labs    CBC Recent Labs    02/28/18 2108  03/02/18 0400 03/02/18 1628 03/02/18 1629  WBC 16.5*   < > 15.7* 11.4*  --   NEUTROABS 14.8*  --  13.4*  --   --   HGB 12.3*   < > 10.2* 8.5* 8.5*  HCT 39.4   < > 30.2* 25.6* 25.0*  MCV 88.3   < > 86.8 88.3  --   PLT 191   < > 111* 78*  --    < > = values in this interval not displayed.   Basic Metabolic Panel Recent Labs    03/01/18 1421  03/02/18 0400 03/02/18 1628 03/02/18 1629  NA  --    < > 137 138 137  K  --    < > 4.4 4.3 4.3  CL  --    < > 104 105 101  CO2  --   --  20* 25  --   GLUCOSE  --    < > 166* 190* 193*  BUN  --    < > 15 10 10   CREATININE 3.82*   < > 3.31* 2.82* 2.70*  CALCIUM  --   --  8.1* 8.0*  --   MG 1.5*  --   --  2.1  --  PHOS  --   --   --  3.3  --    < > = values in this interval not displayed.   Liver Function Tests Recent Labs    02/28/18 2108 03/02/18 0400 03/02/18 1628  AST 46* 33  --   ALT 49* 16  --   ALKPHOS 127* 54  --   BILITOT 1.5* 2.5*  --   PROT 8.1 5.0*  --   ALBUMIN 3.8 3.0* 2.6*   Recent Labs    02/28/18 2108  LIPASE 21   Cardiac Enzymes No results for input(s): CKTOTAL, CKMB, CKMBINDEX, TROPONINI in the last 72 hours.  BNP: BNP (last 3 results) Recent Labs    06/26/17 0004 07/03/17 0037 03/02/18 0400  BNP 1,409.3* 758.9* 694.1*    ProBNP (last 3 results) No results for input(s): PROBNP in the last 8760 hours.   D-Dimer Recent Labs    03/01/18 0924  DDIMER 2.73*   Hemoglobin A1C No results for input(s): HGBA1C in the last 72 hours. Fasting Lipid Panel No results for input(s): CHOL, HDL, LDLCALC, TRIG, CHOLHDL, LDLDIRECT in the last 72 hours. Thyroid Function Tests No results for input(s): TSH, T4TOTAL, T3FREE, THYROIDAB in the last 72 hours.  Invalid input(s): FREET3  Other results:   Imaging    Dg Chest Port 1 View  Result Date: 03/02/2018 CLINICAL DATA:  Respiratory failure.   LVAD pump exchange. EXAM: PORTABLE CHEST 1 VIEW COMPARISON:  03/01/2018 FINDINGS: LVAD again noted. Endotracheal tube with tip 2 cm above the carina, RIGHT IJ Swan-Ganz catheter with tip overlying the main pulmonary artery, LEFT IJ central venous catheter with tip overlying the LEFT brachiocephalic vein, bilateral thoracostomy tubes and NG tube entering the stomach with tip off the field of view noted. This is a low volume film. No pneumothorax. Minimal pulmonary vascular congestion noted. IMPRESSION: Support apparatus described with minimal pulmonary vascular congestion. No pneumothorax. Electronically Signed   By: Margarette Canada M.D.   On: 03/02/2018 09:12     Medications:     Scheduled Medications: . acetaminophen  1,000 mg Oral Q6H   Or  . acetaminophen (TYLENOL) oral liquid 160 mg/5 mL  1,000 mg Per Tube Q6H  . aspirin EC  325 mg Oral Daily   Or  . aspirin  324 mg Per Tube Daily   Or  . aspirin  300 mg Rectal Daily  . bisacodyl  10 mg Oral Daily   Or  . bisacodyl  10 mg Rectal Daily  . Chlorhexidine Gluconate Cloth  6 each Topical Daily  . [START ON 03/03/2018] Chlorhexidine Gluconate Cloth  6 each Topical Daily  . docusate sodium  200 mg Oral Daily  . insulin aspart  0-9 Units Subcutaneous Q4H  . insulin glargine  12 Units Subcutaneous QHS  . metoCLOPramide (REGLAN) injection  10 mg Intravenous Q6H  . [START ON 03/03/2018] pantoprazole  40 mg Oral Daily  . sodium chloride flush  10-40 mL Intracatheter Q12H  . sodium chloride flush  3 mL Intravenous Q12H  . warfarin  4 mg Oral q1800  . Warfarin - Physician Dosing Inpatient   Does not apply q1800    Infusions: . sodium chloride 10 mL/hr at 03/02/18 2200  . sodium chloride    . sodium chloride 10 mL/hr at 03/02/18 0400  . sodium chloride 10 mL/hr at 03/02/18 2200  . cefUROXime (ZINACEF)  IV 1 g (03/02/18 2212)  . dexmedetomidine (PRECEDEX) IV infusion Stopped (03/02/18 1230)  . EPINEPHrine 4 mg  in dextrose 5% 250 mL infusion (16  mcg/mL) Stopped (03/02/18 1547)  . lactated ringers Stopped (03/01/18 1659)  . lactated ringers    . magnesium sulfate    . milrinone 0.25 mcg/kg/min (03/02/18 1816)  . norepinephrine (LEVOPHED) Adult infusion 4 mcg/min (03/02/18 2215)  . phenylephrine (NEO-SYNEPHRINE) Adult infusion 30 mcg/min (03/02/18 2200)  . dialysis replacement fluid (prismasate) 400 mL/hr at 03/02/18 2203  . dialysis replacement fluid (prismasate) 200 mL/hr at 03/02/18 2210  . dialysate (PRISMASATE) 1,800 mL/hr at 03/02/18 2222  . sodium chloride    . vancomycin Stopped (03/02/18 0304)    PRN Medications: sodium chloride, alteplase, calcium chloride, heparin, midazolam, morphine injection, ondansetron (ZOFRAN) IV, oxyCODONE, sodium chloride, sodium chloride flush, sodium chloride flush, sodium chloride flush, traMADol    Patient Profile    [name] is a 52 year old with a history of CAD and ischemic cardiomyopathy now s/p Heartmate 3 LVAD on 10/18. Post VAD course complicated by ESRD and severe DM gastroparesis. Admitted with low/no-flow alarms due to LVAD outflow graft kink (does not have bend clip).     Assessment/Plan   1. Cardiogenic shock due to LVAD complication with LVAD outflow graft thrombosis - s/p Emergent LVAD pump exchange 6/27 - now extubated on milrinone 0.25, NE 4 and neo 40. Wean as tolerated - CVP down to 7 and HR climbing. Will run CVVHD even. Give 1 amp albumin. Avoid b-blocker currently if possible - Volume status improved with CVVHD - VAD interrogated personally. Parameters stable.  2. VAD - VAD interrogated personally. Parameters stable. - LDH ok - Coumadin started  3. Intractable Nausea/Vomiting secondary to gastroparesis - Improved.  - Resume reglan, phenergan, and ativan as needed - Continue erythromycin 250 mg three times a day for gastroparesis. He has been seen by Dr Derrill Kay on 01/24/18 [facility])  4. ESRD - Now on CVVHD CVP down Will run even.  Spoke with HD nurse and RUE AVF seems to still be viable.   5. DM,insulin dependent - SSI for now  6. Acute respiratory failure - Extubated today. Stable   CRITICAL CARE Performed by: [name]  Total critical care time: 35 minutes  Critical care time was exclusive of separately billable procedures and treating other patients.  Critical care was necessary to treat or prevent imminent or life-threatening deterioration.  Critical care was time spent personally by me (independent of midlevel providers or residents) on the following activities: development of treatment plan with patient and/or surrogate as well as nursing, discussions with consultants, evaluation of patient's response to treatment, examination of patient, obtaining history from patient or surrogate, ordering and performing treatments and interventions, ordering and review of laboratory studies, ordering and review of radiographic studies, pulse oximetry and re-evaluation of patient's condition.     Length of Stay: 2  [name] Bickers, MD  03/02/2018, 11:10 PM  Advanced Heart Failure Team Pager [phone] (M-F; 7a - 4p)  Please contact [facility] for night-coverage after hours (4p -7a ) and weekends on [email or web address]

## 2018-03-02 NOTE — Progress Notes (Signed)
INO therapy wean is going well. No complications noted.

## 2018-03-02 NOTE — Anesthesia Postprocedure Evaluation (Signed)
Anesthesia Post Note  Patient: [name]  Procedure(s) Performed: TRANSESOPHAGEAL ECHOCARDIOGRAM (TEE) (N/A ) REDO INSERTION OF IMPLANTABLE LEFT VENTRICULAR ASSIST DEVICE - HEARTMATE 3 (N/A Chest)     Patient location during evaluation: SICU Anesthesia Type: General Level of consciousness: sedated Pain management: pain level controlled Vital Signs Assessment: post-procedure vital signs reviewed and stable Respiratory status: patient remains intubated per anesthesia plan Cardiovascular status: stable Postop Assessment: no apparent nausea or vomiting Anesthetic complications: no    Last Vitals:  Vitals:   03/02/18 1845 03/02/18 1900  BP: 94/61 (!) 77/61  Pulse: (!) 123 (!) 124  Resp: (!) 21 (!) 31  Temp: 37 C 37 C  SpO2: 100% 100%    Last Pain:  Vitals:   03/02/18 1434  TempSrc:   PainSc: 1                  Broady Lafoy

## 2018-03-02 NOTE — Progress Notes (Signed)
Per Dr. Jonnie Finner order, patient's R Upper arm AVG assessed for cannulation.  Positive for bruit, minimal thrill.  Easily cannulated x 1 with 15 G needle.  Aspirated and flushed very easy.

## 2018-03-02 NOTE — Progress Notes (Signed)
LVAD Coordinator Rounding Note:  Admitted 02/28/18 from the ED for 0 Flow from ED. Pt was taken emergently to the OR for outflow graft twist/obstruction.  HM III LVAD implanted on 06/12/17 by Dr. Prescott Gum under Destination Therapy criteria due to renal insufficiency. Pump exchange on 03/01/18 for outflow graft twist/obstruction.   Pt currently weaning from ventilator, gives me a thumbs up. Currently undergoing CVVH.  Vital signs: Temp: 96.4 HR:  94 Doppler Pressure:  100  Arterial line:  99/64 (75) O2 Sat: 100% on 45% FiO2 Wt: 193 lbs  LVAD interrogation reveals:  Speed: 5200 Flow: 3.9 Power:  4.0w PI: 3.2 Alarms: none  Events:  none Hematocrit: 30 Fixed speed: 5200 Low speed limit: 4900  Drive Line: Existing VAD dressing removed and site care performed using sterile technique. Drive line exit site cleaned with Chlora prep applicators x 2, allowed to dry, and gauze dressing with silver strip re-applied. Exit site healing and unincorporated, the velour is fully implanted at exit site. 2 stitches intact. No redness, tenderness, drainage, foul odor or rash noted. Drive line anchor secure x 2.  Removed iodoform from old driveline site and repacked with 2" iodoform using sterile technique. Covered with a 4 x 4 and tape.  Labs:  LDH trend: 384  INR trend: 1.70  Blood Products: Intra-op: 03/01/18 - 5 PRBCs     2 PLT     2 FFP     2 Cryo  Gtts: Levo 12 mcg/min Precedex 0.4 mcg/kg/hr Neo 25 mcg/min Epi 1 mcg/min Milrinone 0.25 mcg/kg/min  Anticoagulation Plan: -INR Goal: 2.0 - 3.0 -ASA Dose: 325 mg daily until INR therapeutic  Device: -St Jude dual  -Therapies: currently off - Monitor: on VT 150 bpm  Adverse Events on VAD: - ESRD post LVAD; started CVVH 06/15/17; HD started 06/20/17 - Hospitalization 11/5 - 07/16/17 > gastroparesis diagnosis after EGD - Hospitalization 11/26 - 08/04/17 > gastroparesis - Hospitalization 1/8 - 09/16/17>gastroparesis - referred to Dr.  Corliss Parish at [facility] - 10/31/17>rightupper arm arteriovenous graft with Artegraft; ligation of right first stage brachial vein transposition  - 12/11/13 elective admission for thrombectomy for AV graft in RUA  - 12/20/17 > intractable N/V - 01/19/18 > intractable N/V sent by EMS from dialysis center - 03/01/18 > outflow graft twist/occulsion requiring pump exchange   Plan/Recommendations:   1. Rachel Moulds, Nurse Davonna Belling will do VAD dressing and packing this weekend. 2. Please page VAD coordinator with any equipment questions or concerns.  [name] RN, VAD Coordinator 24/7 VAD Pager: [phone]

## 2018-03-02 NOTE — Procedures (Signed)
Extubation Procedure Note  Patient Details:   Name: [name] DOB: 1965/10/28 MRN: [id number]   Airway Documentation:    Vent end date: 03/02/18 Vent end time: 1225   Evaluation  O2 sats: stable throughout Complications: No apparent complications Patient did tolerate procedure well. Bilateral Breath Sounds: Clear   Yes   Patient was extubated to a 4L [place] without any complications, dyspnea or stridor noted. Patient was instructed on IS x 10, highest goal achieved was 52m. NIF: -20, VC: 1L, positive cuff leak.  CClaretta Fraise6/28/2019, 12:25 PM

## 2018-03-02 NOTE — Progress Notes (Signed)
[facility] Progress Note  Subjective: still on the vent, no issues w/ CRRT, no sig system clotting, pulling -50/hr w/o difficutly.  Still on pressors and inotropes.   Vitals:   03/02/18 1230 03/02/18 1245 03/02/18 1300 03/02/18 1315  BP: 100/72 94/68 (!) 87/60   Pulse: (!) 110 (!) 109 (!) 105 (!) 108  Resp: (!) 24 (!) 31 (!) 21 19  Temp: (!) 96.6 F (35.9 C) (!) 96.6 F (35.9 C) (!) 96.6 F (35.9 C) (!) 96.6 F (35.9 C)  TempSrc:      SpO2: 100% 100% 100% 100%  Weight:      Height:        Inpatient medications: . acetaminophen  1,000 mg Oral Q6H   Or  . acetaminophen (TYLENOL) oral liquid 160 mg/5 mL  1,000 mg Per Tube Q6H  . aspirin EC  325 mg Oral Daily   Or  . aspirin  324 mg Per Tube Daily   Or  . aspirin  300 mg Rectal Daily  . bisacodyl  10 mg Oral Daily   Or  . bisacodyl  10 mg Rectal Daily  . Chlorhexidine Gluconate Cloth  6 each Topical Daily  . docusate sodium  200 mg Oral Daily  . insulin aspart  0-9 Units Subcutaneous Q4H  . metoCLOPramide (REGLAN) injection  10 mg Intravenous Q6H  . [START ON 03/03/2018] pantoprazole  40 mg Oral Daily  . sodium chloride flush  3 mL Intravenous Q12H  . warfarin  4 mg Oral q1800  . Warfarin - Physician Dosing Inpatient   Does not apply q1800   . sodium chloride 10 mL/hr at 03/02/18 1300  . sodium chloride    . sodium chloride 10 mL/hr at 03/02/18 0400  . sodium chloride 10 mL/hr at 03/02/18 1300  . cefUROXime (ZINACEF)  IV Stopped (03/02/18 0174)  . dexmedetomidine (PRECEDEX) IV infusion Stopped (03/02/18 1230)  . EPINEPHrine 4 mg in dextrose 5% 250 mL infusion (16 mcg/mL) 1 mcg/min (03/02/18 1300)  . lactated ringers Stopped (03/01/18 1659)  . lactated ringers    . magnesium sulfate    . milrinone 0.25 mcg/kg/min (03/02/18 1300)  . milrinone    . norepinephrine (LEVOPHED) Adult infusion 6 mcg/min (03/02/18 1300)  . phenylephrine (NEO-SYNEPHRINE) Adult infusion 25 mcg/min (03/02/18 1300)  . dialysis  replacement fluid (prismasate) 400 mL/hr at 03/02/18 0842  . dialysis replacement fluid (prismasate) 200 mL/hr at 03/01/18 2015  . dialysate (PRISMASATE) 1,800 mL/hr at 03/02/18 1309  . sodium chloride    . vancomycin Stopped (03/02/18 0304)   sodium chloride, alteplase, calcium chloride, heparin, midazolam, morphine injection, ondansetron (ZOFRAN) IV, oxyCODONE, sodium chloride, sodium chloride flush, sodium chloride flush, traMADol  Exam: Gen intubated on vent, mild facial edema No rash, cyanosis or gangrene Sclera anicteric, throat w ETT  No jvd or bruits Chest clear ant and lat, mult chest tubes and drive-line in ant chest RRR hum of LVAD  Abd soft ntnd no mass or ascites +bs GU normal male MS no joint effusions or deformity Ext 1+ bilat LE edema, no wounds or ulcers Neuro is sedated on the vent    Home meds:  - norvasc 2.5/ hydralazine 37.6m tid  - ecasa/ lipitor/ sildenafil 20 tid/ coumadin 5 hs  - PPI/ reglan 10 tid/ ativan prn/ marinol prn  - lantus 10 u qd/ novolog 5 u tid  Dialysis: MWF [facility]  4h 113m 80kg  3K/ 2Ca  RUA AVG / R IJ TDC  Hep none  -  calc 0.5 ug tiw  - mircera 50 q  2, last 6/26  - venofer 50 weekly   Impression: 1  LVAD pump thrombosis - s/p redo sternotomy w/ pump exchange (LVAD heartmate 3) on 02/28/18 2  ESRD - usual HD is MWF. Electrolytes are ok.  TDC removed as part of LVAD protocol. Has RUA AVG which is patent and ready for use. Has temp cath and currently getting CRRT due to shock / mult pressors/ vol overload  3  Volume - CXR ok today, up 7kg by wts 4  Shock - cardiogenic on milrinone and pressors 5  VDRF - on vent and sedated 6  DM - on IV insulin 7  ID - on vanc IV and po rifadin    Plan - as above   Kelly Splinter MD [facility] pager [phone]   03/02/2018, 1:26 PM   Recent Labs  Lab 02/28/18 2108  03/01/18 0924 03/01/18 0926  03/01/18 1429 03/02/18 0400  NA 133*   < >  --  139  --  139 137  K 4.4   <  >  --  3.4*  --  3.7 4.4  CL 93*   < >  --  106  --  103 104  CO2 21*   < >  --  21*  --   --  20*  GLUCOSE 150*   < >  --  114*  --  113* 166*  BUN 17   < >  --  18  --  20 15  CREATININE 3.98*   < >  --  3.49*   < > 3.70* 3.31*  CALCIUM 8.8*   < >  --  7.2*  --   --  8.1*  ALBUMIN 3.8  --   --   --   --   --  3.0*  INR  --    < > 1.67  --   --   --  1.70   < > = values in this interval not displayed.   Recent Labs  Lab 02/28/18 2108 03/02/18 0400  AST 46* 33  ALT 49* 16  ALKPHOS 127* 54  BILITOT 1.5* 2.5*  PROT 8.1 5.0*   Recent Labs  Lab 02/28/18 2108  03/01/18 1421 03/01/18 1429 03/02/18 0400  WBC 16.5*   < > 15.7*  --  15.7*  NEUTROABS 14.8*  --   --   --  13.4*  HGB 12.3*   < > 11.1* 10.9* 10.2*  HCT 39.4   < > 33.2* 32.0* 30.2*  MCV 88.3   < > 87.1  --  86.8  PLT 191   < > 140*  --  111*   < > = values in this interval not displayed.   Iron/TIBC/Ferritin/ %Sat    Component Value Date/Time   IRON 40 (L) 11/02/2017 0016   TIBC 188 (L) 11/02/2017 0016   FERRITIN 523 (H) 11/02/2017 0016   IRONPCTSAT 21 11/02/2017 0016

## 2018-03-02 NOTE — Progress Notes (Signed)
2 Days Post-Op Procedure(s) (LRB): TRANSESOPHAGEAL ECHOCARDIOGRAM (TEE) (N/A) REDO INSERTION OF IMPLANTABLE LEFT VENTRICULAR ASSIST DEVICE - HEARTMATE 3 (N/A) Subjective:  Postop day 1 HeartMate 3 LVAD change out for pump thrombus, pump occlusion, twisted outflow graft.  Patient extubated today and stood at side of bed Receiving CVVH to remove fluid with preoperative renal failure Minimal chest tube drainage VAD parameters satisfactory at speed 5200 RPM  Objective: Vital signs in last 24 hours: Temp:  [95.5 F (35.3 C)-98.2 F (36.8 C)] 97.7 F (36.5 C) (06/28 1545) Pulse Rate:  [90-118] 116 (06/28 1545) Cardiac Rhythm: Sinus tachycardia (06/28 1230) Resp:  [0-33] 19 (06/28 1545) BP: (73-128)/(37-94) 84/63 (06/28 1545) SpO2:  [94 %-100 %] 100 % (06/28 1545) Arterial Line BP: (67-125)/(49-73) 67/49 (06/28 1545) FiO2 (%):  [45 %-50 %] 45 % (06/28 1112) Weight:  [193 lb 2 oz (87.6 kg)] 193 lb 2 oz (87.6 kg) (06/28 0609)  Hemodynamic parameters for last 24 hours: PAP: (18-44)/(2-22) 30/10 CVP:  [4 mmHg-19 mmHg] 4 mmHg CO:  [3.5 L/min-6.2 L/min] 6.2 L/min CI:  [1.9 L/min/m2-3.3 L/min/m2] 3.3 L/min/m2  Intake/Output from previous day: 06/27 0701 - 06/28 0700 In: 6845.7 [I.V.:3475.4; Blood:1890; NG/GT:20; IV Piggyback:1460.4] Out: 5003 [Urine:55; Emesis/NG output:70; Blood:2000; Chest Tube:958] Intake/Output this shift: Total I/O In: 822.4 [I.V.:547.5; Other:23; NG/GT:102; IV Piggyback:149.9] Out: 7681 [Emesis/NG output:100; LXBWI:2035; Chest Tube:160]  Comfortable, responsive breathing spontaneously Lungs clear Normal VAD Hom Abdomen soft Extremities warm Neuro intact  Lab Results: Recent Labs    03/01/18 1421 03/01/18 1429 03/02/18 0400  WBC 15.7*  --  15.7*  HGB 11.1* 10.9* 10.2*  HCT 33.2* 32.0* 30.2*  PLT 140*  --  111*   BMET:  Recent Labs    03/01/18 0926  03/01/18 1429 03/02/18 0400  NA 139  --  139 137  K 3.4*  --  3.7 4.4  CL 106  --  103 104   CO2 21*  --   --  20*  GLUCOSE 114*  --  113* 166*  BUN 18  --  20 15  CREATININE 3.49*   < > 3.70* 3.31*  CALCIUM 7.2*  --   --  8.1*   < > = values in this interval not displayed.    PT/INR:  Recent Labs    03/02/18 0400  LABPROT 19.8*  INR 1.70   ABG    Component Value Date/Time   PHART 7.414 03/02/2018 0403   HCO3 22.5 03/02/2018 0403   TCO2 24 03/02/2018 0403   ACIDBASEDEF 2.0 03/02/2018 0403   O2SAT 75.3 03/02/2018 0405   CBG (last 3)  Recent Labs    03/02/18 0704 03/02/18 0752 03/02/18 1211  GLUCAP 111* 113* 158*    Assessment/Plan: S/P Procedure(s) (LRB): TRANSESOPHAGEAL ECHOCARDIOGRAM (TEE) (N/A) REDO INSERTION OF IMPLANTABLE LEFT VENTRICULAR ASSIST DEVICE - HEARTMATE 3 (N/A) Continue inotropic support to maintain adequate MAP, continue milrinone for RV function Resume Coumadin and oral diet Remove PA catheter and another chest tube tomorrow and start mobilization out of bed   LOS: 2 days    Tharon Aquas Trigt III 03/02/2018

## 2018-03-02 NOTE — Progress Notes (Signed)
PAP are stable. INO therapy is 3ppm per order. Pt tolerating it well no distress or complications noted.

## 2018-03-02 NOTE — Op Note (Signed)
NAMENEVIN, GRIZZLE MEDICAL RECORD EY:[id number] ACCOUNT [id number] DATE OF BIRTH:18-Mar-1966 FACILITY: [facility] LOCATION: [facility] PHYSICIAN:Tanita Palinkas VAN TRIGT III, MD  OPERATIVE REPORT  DATE OF PROCEDURE:  03/01/2018  OPERATION: 1.  Redo sternotomy. 2.  HeartMate 3 left ventricular assist device replacement--pump exchange.  SURGEON:  Ivin Poot III, MD  ASSISTANT:  Lars Pinks, PA-C   PREOPERATIVE DIAGNOSIS:  History of implantation of HeartMate 3 in 06/2017 for ischemic cardiomyopathy with acute outflow graft  thrombus and malfunction from partial outflow graft twist and subtherapeutic INR    CLINICAL NOTE:  The patient is a 52 year old gentleman with implantation of HeartMate 3 on 06/13/2017.  He had preoperative renal dysfunction and developed postoperative dialysis-dependent renal failure.  He had been stable with chronic medical problems  involving gastroparesis.  He presented to the hospital feeling fatigued and with a recent occurrence of alarm sounding on his VAD controller.  He was evaluated in the emergency department and found to have stoppage of his flows on his LVAD pump.  His INR  was normal.  He had not been taking his Coumadin because of nausea.  He was placed on the heparin.  An echo showed LVEF to be approximately 30%.  The patient had a normal blood pressure and was not in shock.  A CTA was performed which showed contrast  entering the pump, but the outflow graft was occluded with thrombus.  The distal anastomosis of the outflow graft of the aorta had flow.  After evaluating the patient and considering the options, the recommendation was made to the patient for emergency replacement of his pump with a new HeartMate 3.  The patient was admitted to the ED while the operating room team was called in and the  room prepared, and the patient was prepared for surgery.  I discussed the indications and benefits (increased survival) of the surgery with the patient as  well as the risks (stroke, recurrent pump thrombus, infection, organ failure, and death).  He  understood, and he agreed to proceed with surgery.  DESCRIPTION OF PROCEDURE:  The patient was brought directly from the ICU to the operating room.  Invasive monitoring lines and TEE were placed, and the patient was placed under general anesthesia.  The patient was prepped and draped as a sterile field.   A proper timeout was performed.  A 5-French Microsheath was placed in the right femoral vein.  A redo sternotomy was performed with care being taken to avoid injury to the underlying vascular structures.  A counter incision was made in the right  midabdomen and the right subcostal area for the driveline tunnels.  The mediastinum was carefully dissected.  It was filled with dense adhesions.  The outflow graft was covered with a Gore-Tex membrane.  The right atrium was not visible due to heavy  adhesions.  The ascending aorta was carefully dissected, and the anastomosis of the outflow graft to the aorta was identified and was patent with blood flow.  As much dissection was performed as could be tolerated off cardiopulmonary bypass.  We  dissected the pump back to the outflow connection.  We were able to remove the bend relief guard and twist the graft back 90 degrees to its anatomic position.  After twisting the graft, there was still no flow through the pump, which was felt to be  thrombosed.  The decision was made to replace the HeartMate 3 pump.  The patient was prepared for cardiopulmonary bypass.  Heparin was administered.  A  23-25 long venous cannula was passed under echo guidance into the right atrium from the right femoral vein.  This  was connected to venous outflow to the cardiopulmonary bypass circuit.  A pursestring was placed in the ascending aorta above the graft anastomosis, and the arterial inflow cannula was placed.  When the ACT was documented as being therapeutic, the  patient was cannulated  and placed on bypass.  Under full bypass, further mobilization of the pump was obtained mainly in the area of the previous pump pocket in the LV apex.  CO2 was insufflated into the operative field.  The outflow graft had been clamped to avoid propagation of clot.  The power cord was divided to help mobilize the body of the pump.  The heavy scar around the apex of the LV, which completely enveloped the main body of the HeartMate 3 pump and the inflow  cannula, was dissected carefully.  When we were finally able to release the locking mechanism, the inflow cannula was removed.  The graft was divided proximal to the clamp at the distal aspect of the graft where it remained patent.  The pump was removed from the field.  The driveline was  mobilized at the lower sternal incision for later complete removal from the original VAD tunnel exit site.  The sewing ring for the inflow cannula was dissected of scar to allow reinsertion of a new pump.  There was also some heavy trabeculation in the LV, which was carefully trimmed away.  There was no clot in the heart or in the inflow cannula.  New HeartMate 3 pump was assembled and brought to the field.  It was inserted into the LV apex of the sewing ring in the appropriate alignment and the locking mechanism engaged.  The pump was placed into the pericardium and pump pocket, and the new  driveline was tunneled out to the right side of the patient away from the previous tunnel.  Next, the outflow graft was cut to the appropriate length and beveled.  The outflow graft was completely divided proximal to the vascular clamp, and a minimal amount of clot in this part of the graft was removed and flushed.  Next, an end-to-end  anastomosis with running 4-0 Prolene was created between the outflow graft of the new HeartMate 3 pump to the very distal aspect of the original full graft, which then remained anastomosed to the aorta.  Air was vented from this anastomosis, and the   patient was prepared to transition from cardiopulmonary bypass to HeartMate 3 support.  The lungs were fully inflated, and the pleural spaces were drained.  A temporary pacing wire was placed in the right ventricle to control heart rate.  The right  atrium was completely obliterated with adhesions and not visible.  The patient was then started on inotropic support and transitioned from cardiopulmonary bypass to HeartMate 3.  This occurred slowly and carefully with minimal difficulty.  Next, on full HeartMate 3 support, the patient was given protamine to reverse heparin.  The cannulas were removed.  The patient had severe coagulopathy and was given FFP as well as platelets were low platelet count and fibrinogen and cryoprecipitate for  a low fibrinogen count.  Bleeding was slowly controlled.  Drains were placed in both pleural spaces in the anterior mediastinum and in the VAD pocket.  The sternum was reapproximated with a wire.  The patient remained stable.  The sternal incision was then closed in a standard fashion.  The patient  returned to the ICU in critical but stable condition.  Total cardiopulmonary bypass time was 85 minutes.  LN/NUANCE  D:03/02/2018 T:03/02/2018 JOB:[id number]

## 2018-03-03 ENCOUNTER — Inpatient Hospital Stay (HOSPITAL_COMMUNITY): Payer: BLUE CROSS/BLUE SHIELD

## 2018-03-03 LAB — GLUCOSE, CAPILLARY
GLUCOSE-CAPILLARY: 112 mg/dL — AB (ref 70–99)
GLUCOSE-CAPILLARY: 118 mg/dL — AB (ref 70–99)
GLUCOSE-CAPILLARY: 89 mg/dL (ref 70–99)
GLUCOSE-CAPILLARY: 96 mg/dL (ref 70–99)
Glucose-Capillary: 137 mg/dL — ABNORMAL HIGH (ref 70–99)
Glucose-Capillary: 166 mg/dL — ABNORMAL HIGH (ref 70–99)

## 2018-03-03 LAB — CBC WITH DIFFERENTIAL/PLATELET
ABS IMMATURE GRANULOCYTES: 0.1 10*3/uL (ref 0.0–0.1)
BASOS ABS: 0 10*3/uL (ref 0.0–0.1)
Basophils Relative: 0 %
Eosinophils Absolute: 0.3 10*3/uL (ref 0.0–0.7)
Eosinophils Relative: 2 %
HCT: 26.9 % — ABNORMAL LOW (ref 39.0–52.0)
HEMOGLOBIN: 8.7 g/dL — AB (ref 13.0–17.0)
Immature Granulocytes: 1 %
LYMPHS ABS: 1.1 10*3/uL (ref 0.7–4.0)
LYMPHS PCT: 8 %
MCH: 28.9 pg (ref 26.0–34.0)
MCHC: 32.3 g/dL (ref 30.0–36.0)
MCV: 89.4 fL (ref 78.0–100.0)
MONO ABS: 1.1 10*3/uL — AB (ref 0.1–1.0)
Monocytes Relative: 8 %
NEUTROS ABS: 10.1 10*3/uL — AB (ref 1.7–7.7)
Neutrophils Relative %: 81 %
Platelets: 80 10*3/uL — ABNORMAL LOW (ref 150–400)
RBC: 3.01 MIL/uL — AB (ref 4.22–5.81)
RDW: 16.7 % — ABNORMAL HIGH (ref 11.5–15.5)
WBC: 12.7 10*3/uL — AB (ref 4.0–10.5)

## 2018-03-03 LAB — COMPREHENSIVE METABOLIC PANEL
ALT: 15 U/L (ref 0–44)
AST: 27 U/L (ref 15–41)
Albumin: 2.9 g/dL — ABNORMAL LOW (ref 3.5–5.0)
Alkaline Phosphatase: 66 U/L (ref 38–126)
Anion gap: 7 (ref 5–15)
BUN: 6 mg/dL (ref 6–20)
CO2: 25 mmol/L (ref 22–32)
Calcium: 7.9 mg/dL — ABNORMAL LOW (ref 8.9–10.3)
Chloride: 105 mmol/L (ref 98–111)
Creatinine, Ser: 2.44 mg/dL — ABNORMAL HIGH (ref 0.61–1.24)
GFR calc Af Amer: 33 mL/min — ABNORMAL LOW (ref >60)
GFR calc non Af Amer: 29 mL/min — ABNORMAL LOW (ref >60)
Glucose, Bld: 102 mg/dL — ABNORMAL HIGH (ref 70–99)
Potassium: 3.9 mmol/L (ref 3.5–5.1)
Sodium: 137 mmol/L (ref 135–145)
Total Bilirubin: 2.2 mg/dL — ABNORMAL HIGH (ref 0.3–1.2)
Total Protein: 5.2 g/dL — ABNORMAL LOW (ref 6.5–8.1)

## 2018-03-03 LAB — CBC
HCT: 24.9 % — ABNORMAL LOW (ref 39.0–52.0)
HEMATOCRIT: 27.3 % — AB (ref 39.0–52.0)
Hemoglobin: 7.9 g/dL — ABNORMAL LOW (ref 13.0–17.0)
Hemoglobin: 8.9 g/dL — ABNORMAL LOW (ref 13.0–17.0)
MCH: 28.9 pg (ref 26.0–34.0)
MCH: 29.7 pg (ref 26.0–34.0)
MCHC: 31.7 g/dL (ref 30.0–36.0)
MCHC: 32.6 g/dL (ref 30.0–36.0)
MCV: 91.2 fL (ref 78.0–100.0)
MCV: 91 fL (ref 78.0–100.0)
Platelets: 76 10*3/uL — ABNORMAL LOW (ref 150–400)
Platelets: 70 10*3/uL — ABNORMAL LOW (ref 150–400)
RBC: 2.73 MIL/uL — ABNORMAL LOW (ref 4.22–5.81)
RBC: 3 MIL/uL — AB (ref 4.22–5.81)
RDW: 16.9 % — ABNORMAL HIGH (ref 11.5–15.5)
RDW: 16.1 % — AB (ref 11.5–15.5)
WBC: 11.3 10*3/uL — ABNORMAL HIGH (ref 4.0–10.5)
WBC: 10.8 10*3/uL — AB (ref 4.0–10.5)

## 2018-03-03 LAB — RENAL FUNCTION PANEL
Albumin: 2.8 g/dL — ABNORMAL LOW (ref 3.5–5.0)
Anion gap: 5 (ref 5–15)
BUN: 7 mg/dL (ref 6–20)
CHLORIDE: 103 mmol/L (ref 98–111)
CO2: 25 mmol/L (ref 22–32)
Calcium: 7.7 mg/dL — ABNORMAL LOW (ref 8.9–10.3)
Creatinine, Ser: 2.02 mg/dL — ABNORMAL HIGH (ref 0.61–1.24)
GFR, EST AFRICAN AMERICAN: 42 mL/min — AB (ref >60)
GFR, EST NON AFRICAN AMERICAN: 36 mL/min — AB (ref >60)
Glucose, Bld: 178 mg/dL — ABNORMAL HIGH (ref 70–99)
POTASSIUM: 6.4 mmol/L — AB (ref 3.5–5.1)
Phosphorus: 1.4 mg/dL — ABNORMAL LOW (ref 2.5–4.6)
Sodium: 133 mmol/L — ABNORMAL LOW (ref 135–145)

## 2018-03-03 LAB — COOXEMETRY PANEL
CARBOXYHEMOGLOBIN: 1.1 % (ref 0.5–1.5)
METHEMOGLOBIN: 1.7 % — AB (ref 0.0–1.5)
O2 Saturation: 54.5 %
Total hemoglobin: 8.9 g/dL — ABNORMAL LOW (ref 12.0–16.0)

## 2018-03-03 LAB — PHOSPHORUS: Phosphorus: 2.1 mg/dL — ABNORMAL LOW (ref 2.5–4.6)

## 2018-03-03 LAB — PREPARE RBC (CROSSMATCH)

## 2018-03-03 LAB — LACTATE DEHYDROGENASE: LDH: 253 U/L — AB (ref 98–192)

## 2018-03-03 LAB — PROTIME-INR
INR: 3.07
Prothrombin Time: 31.5 seconds — ABNORMAL HIGH (ref 11.4–15.2)

## 2018-03-03 LAB — MAGNESIUM: MAGNESIUM: 2.3 mg/dL (ref 1.7–2.4)

## 2018-03-03 MED ORDER — CEFUROXIME SODIUM 1.5 G IV SOLR
1.0000 g | Freq: Two times a day (BID) | INTRAVENOUS | Status: DC
  Administered 2018-03-03 – 2018-03-05 (×4): 1 g via INTRAVENOUS
  Filled 2018-03-03 (×4): qty 1

## 2018-03-03 MED ORDER — LORAZEPAM 0.5 MG PO TABS
0.5000 mg | ORAL_TABLET | Freq: Two times a day (BID) | ORAL | Status: DC | PRN
  Administered 2018-03-03 – 2018-03-04 (×2): 0.5 mg via ORAL
  Filled 2018-03-03 (×2): qty 1

## 2018-03-03 MED ORDER — MORPHINE SULFATE (PF) 2 MG/ML IV SOLN
2.0000 mg | INTRAVENOUS | Status: DC | PRN
  Administered 2018-03-03 – 2018-03-04 (×2): 2 mg via INTRAVENOUS
  Filled 2018-03-03 (×2): qty 1

## 2018-03-03 MED ORDER — ORAL CARE MOUTH RINSE
15.0000 mL | Freq: Two times a day (BID) | OROMUCOSAL | Status: DC
  Administered 2018-03-03 – 2018-03-18 (×16): 15 mL via OROMUCOSAL

## 2018-03-03 MED ORDER — SODIUM CHLORIDE 0.9% IV SOLUTION
Freq: Once | INTRAVENOUS | Status: AC
  Administered 2018-03-03: 18:00:00 via INTRAVENOUS

## 2018-03-03 MED ORDER — CHLORHEXIDINE GLUCONATE CLOTH 2 % EX PADS: 6.0000 | MEDICATED_PAD | Freq: Every day | CUTANEOUS | Status: DC

## 2018-03-03 MED ORDER — PANTOPRAZOLE SODIUM 40 MG PO TBEC
40.0000 mg | DELAYED_RELEASE_TABLET | Freq: Two times a day (BID) | ORAL | Status: DC
  Administered 2018-03-03 – 2018-03-19 (×30): 40 mg via ORAL
  Filled 2018-03-03 (×31): qty 1

## 2018-03-03 MED ORDER — CHLORHEXIDINE GLUCONATE CLOTH 2 % EX PADS
6.0000 | MEDICATED_PAD | Freq: Every day | CUTANEOUS | Status: DC
  Administered 2018-03-04 – 2018-03-09 (×5): 6 via TOPICAL

## 2018-03-03 NOTE — Plan of Care (Signed)
  Problem: Education: Goal: Knowledge of General Education information will improve Outcome: Progressing   Problem: Health Behavior/Discharge Planning: Goal: Ability to manage health-related needs will improve Outcome: Progressing   Problem: Clinical Measurements: Goal: Ability to maintain clinical measurements within normal limits will improve Outcome: Progressing Goal: Will remain free from infection Outcome: Progressing Goal: Diagnostic test results will improve Outcome: Progressing Goal: Respiratory complications will improve Outcome: Progressing Goal: Cardiovascular complication will be avoided Outcome: Progressing   Problem: Activity: Goal: Risk for activity intolerance will decrease Outcome: Progressing   Problem: Nutrition: Goal: Adequate nutrition will be maintained Outcome: Progressing   Problem: Coping: Goal: Level of anxiety will decrease Outcome: Progressing   Problem: Elimination: Goal: Will not experience complications related to bowel motility Outcome: Progressing Goal: Will not experience complications related to urinary retention Outcome: Progressing   Problem: Pain Managment: Goal: General experience of comfort will improve Outcome: Progressing   Problem: Safety: Goal: Ability to remain free from injury will improve Outcome: Progressing   Problem: Skin Integrity: Goal: Risk for impaired skin integrity will decrease Outcome: Progressing   Problem: Education: Goal: Knowledge of the prescribed therapeutic regimen will improve Outcome: Progressing   Problem: Activity: Goal: Risk for activity intolerance will decrease Outcome: Progressing   Problem: Cardiac: Goal: Ability to maintain an adequate cardiac output will improve Outcome: Progressing   Problem: Coping: Goal: Level of anxiety will decrease Outcome: Progressing   Problem: Fluid Volume: Goal: Risk for excess fluid volume will decrease Outcome: Progressing   Problem: Clinical  Measurements: Goal: Ability to maintain clinical measurements within normal limits will improve Outcome: Progressing Goal: Will remain free from infection Outcome: Progressing   Problem: Respiratory: Goal: Will regain and/or maintain adequate ventilation Outcome: Progressing

## 2018-03-03 NOTE — Progress Notes (Signed)
Drive Line: Existing VAD dressing removed and site care performed using sterile technique. Drive line exit site cleaned with Chlora prep applicators x 2, allowed to dry, and gauze dressing with silver strip re-applied. Exit site healing and unincorporated, the velour is fully implanted at exit site. 2 stitches intact. No redness, tenderness, foul odor or rash noted. Small amount of dark red/brown drainage. Drive line anchors changed  x 2.  Removed iodoform from old driveline site and repacked with 2" iodoform using sterile technique. Covered with a 4 x 4 and tape.

## 2018-03-03 NOTE — Progress Notes (Signed)
Inpatient Rehabilitation Admissions Coordinator  Rehab Admissions Coordinator Note:  Patient was screened by Cleatrice Burke for appropriateness for an Inpatient Acute Rehab Consult per PT and OT recommendation.  At this time, we are recommending Inpatient Rehab consult once off CVVHD.  Cleatrice Burke 03/03/2018, 7:28 PM  I can be reached at [phone].

## 2018-03-03 NOTE — Plan of Care (Signed)
  Problem: Pain Managment: Goal: General experience of comfort will improve Outcome: Progressing   Problem: Activity: Goal: Risk for activity intolerance will decrease Outcome: Progressing  Patient able to sit up in the chair for 1 hour and 20 minutes today

## 2018-03-03 NOTE — Progress Notes (Signed)
[facility] Progress Note  Subjective: keeping even now w/ lower CVP per cards.  Net negative 1.5L yesterday.  Extubated. Labs good.   Vitals:   03/03/18 0700 03/03/18 0800 03/03/18 0900 03/03/18 1000  BP:    107/74  Pulse: (!) 126 (!) 123 (!) 123 (!) 119  Resp: (!) 25 (!) 23 18 (!) 26  Temp: 97.9 F (36.6 C) 98.8 F (37.1 C) 99.1 F (37.3 C) 99 F (37.2 C)  TempSrc:  Core    SpO2: 98% 99% 100% 100%  Weight:      Height:        Inpatient medications: . acetaminophen  1,000 mg Oral Q6H   Or  . acetaminophen (TYLENOL) oral liquid 160 mg/5 mL  1,000 mg Per Tube Q6H  . aspirin EC  325 mg Oral Daily   Or  . aspirin  324 mg Per Tube Daily   Or  . aspirin  300 mg Rectal Daily  . bisacodyl  10 mg Oral Daily   Or  . bisacodyl  10 mg Rectal Daily  . Chlorhexidine Gluconate Cloth  6 each Topical Daily  . docusate sodium  200 mg Oral Daily  . insulin aspart  0-9 Units Subcutaneous Q4H  . mouth rinse  15 mL Mouth Rinse BID  . metoCLOPramide (REGLAN) injection  10 mg Intravenous Q6H  . pantoprazole  40 mg Oral BID  . sodium chloride flush  10-40 mL Intracatheter Q12H  . sodium chloride flush  3 mL Intravenous Q12H  . Warfarin - Physician Dosing Inpatient   Does not apply q1800   . sodium chloride 10 mL/hr at 03/03/18 0300  . sodium chloride    . sodium chloride 10 mL/hr at 03/02/18 0400  . sodium chloride 10 mL/hr at 03/03/18 0300  . cefUROXime (ZINACEF)  IV    . EPINEPHrine 4 mg in dextrose 5% 250 mL infusion (16 mcg/mL) Stopped (03/02/18 1547)  . lactated ringers Stopped (03/01/18 1659)  . lactated ringers    . magnesium sulfate    . milrinone 0.25 mcg/kg/min (03/03/18 0336)  . norepinephrine (LEVOPHED) Adult infusion Stopped (03/03/18 1011)  . phenylephrine (NEO-SYNEPHRINE) Adult infusion 25 mcg/min (03/03/18 1049)  . dialysis replacement fluid (prismasate) 400 mL/hr at 03/02/18 2203  . dialysis replacement fluid (prismasate) 200 mL/hr at 03/02/18 2210  .  dialysate (PRISMASATE) 1,800 mL/hr at 03/03/18 0930  . sodium chloride     sodium chloride, alteplase, calcium chloride, heparin, LORazepam, morphine injection, ondansetron (ZOFRAN) IV, oxyCODONE, sodium chloride, sodium chloride flush, sodium chloride flush, traMADol  Exam: Gen intubated on vent, mild facial edema No rash, cyanosis or gangrene Sclera anicteric, throat w ETT  No jvd or bruits Chest clear ant and lat, mult chest tubes and drive-line in ant chest RRR hum of LVAD  Abd soft ntnd no mass or ascites +bs GU normal male MS no joint effusions or deformity Ext 1+ bilat LE edema, no wounds or ulcers Neuro is sedated on the vent    Home meds:  - norvasc 2.5/ hydralazine 37.22m tid  - ecasa/ lipitor/ sildenafil 20 tid/ coumadin 5 hs  - PPI/ reglan 10 tid/ ativan prn/ marinol prn  - lantus 10 u qd/ novolog 5 u tid  Dialysis: MWF [facility]  4h 186m 80kg  3K/ 2Ca  RUA AVG / R IJ TDC  Hep none  - calc 0.5 ug tiw  - mircera 50 q  2, last 6/26  - venofer 50 weekly   Impression: 1  LVAD pump thrombosis - s/p redo sternotomy w/ LVAD pump removal / exchange on 02/28/18 2  ESRD - on CRRT d#2. Keeping even now, had 1.5L net neg yesterday. Vol excess improved some w CRRT. CXR clear, extubated yest. Has temp cath and currently getting CRRT. Has AVG R arm patent and have been using for about a month per the patient (R TDC was dc'd last Monday before pt admitted.   3  Shock - remains on milrinone and pressors 4  Resp failure - off vent now, better 5  DM - on IV insulin 6  ID - on IV vanc and cefuroxime empiric Rx    Plan - defer to CHF team regarding when to transition to regular HD.     Kelly Splinter MD [facility] pager [phone]   03/03/2018, 11:01 AM   Recent Labs  Lab 03/02/18 0400 03/02/18 1628 03/02/18 1629 03/03/18 0430  NA 137 138 137 137  K 4.4 4.3 4.3 3.9  CL 104 105 101 105  CO2 20* 25  --  25  GLUCOSE 166* 190* 193* 102*  BUN 15 10 10 6    CREATININE 3.31* 2.82* 2.70* 2.44*  CALCIUM 8.1* 8.0*  --  7.9*  PHOS  --  3.3  --  2.1*  ALBUMIN 3.0* 2.6*  --  2.9*  INR 1.70  --   --  3.07   Recent Labs  Lab 03/02/18 0400 03/03/18 0430  AST 33 27  ALT 16 15  ALKPHOS 54 66  BILITOT 2.5* 2.2*  PROT 5.0* 5.2*   Recent Labs  Lab 03/02/18 0400 03/02/18 1628 03/02/18 1629 03/03/18 0430  WBC 15.7* 11.4*  --  12.7*  NEUTROABS 13.4*  --   --  10.1*  HGB 10.2* 8.5* 8.5* 8.7*  HCT 30.2* 25.6* 25.0* 26.9*  MCV 86.8 88.3  --  89.4  PLT 111* 78*  --  80*   Iron/TIBC/Ferritin/ %Sat    Component Value Date/Time   IRON 40 (L) 11/02/2017 0016   TIBC 188 (L) 11/02/2017 0016   FERRITIN 523 (H) 11/02/2017 0016   IRONPCTSAT 21 11/02/2017 0016

## 2018-03-03 NOTE — Progress Notes (Signed)
Advanced Heart Failure Rounding Note  PCP-Cardiologist: No primary care provider on file.   Subjective:    Remains on CVVHD. Now keeping even. CVP 8-10. On Neo 30. Milrinone 0.25. Co-ox 55%  Given albumin x 2 last night with HRs in 130 (sinus tach). HR now 110-125  Had one low flow this am but now resolved. (suspect suction)    Feels weak. Cold. No SOB, orthopnea or PND. + shivering.   PT draining 10/hr  MT and pocket 20/hr  CXR low volume but ok (Personally reviewed)  No significant bleeding. Hgb stable 8.5    [place]wan #s CVP 8-10 PA 39/15 Thermo 9.6/5.2  Flow 3.8 Speed 5200  PI 3.0 Power 4.1  VAD interrogated personally. 1 PI and 1 low flow this am    Objective:   Weight Range: 85.8 kg (189 lb 2.5 oz) Body mass index is 29.63 kg/m.   Vital Signs:   Temp:  [96.4 F (35.8 C)-99.5 F (37.5 C)] 99 F (37.2 C) (06/29 1000) Pulse Rate:  [96-130] 119 (06/29 1000) Resp:  [15-33] 26 (06/29 1000) BP: (73-128)/(37-104) 107/74 (06/29 1000) SpO2:  [93 %-100 %] 100 % (06/29 1000) Arterial Line BP: (65-125)/(49-70) 118/68 (06/29 1000) FiO2 (%):  [45 %] 45 % (06/28 1112) Weight:  [83 kg (182 lb 15.7 oz)-85.8 kg (189 lb 2.5 oz)] 85.8 kg (189 lb 2.5 oz) (06/29 0510) Last BM Date: (PTA)  Weight change: Filed Weights   03/02/18 0609 03/02/18 1810 03/03/18 0510  Weight: 87.6 kg (193 lb 2 oz) 83 kg (182 lb 15.7 oz) 85.8 kg (189 lb 2.5 oz)    Intake/Output:   Intake/Output Summary (Last 24 hours) at 03/03/2018 1036 Last data filed at 03/03/2018 1011 Gross per 24 hour  Intake 2126.07 ml  Output 3379 ml  Net -1252.93 ml      Physical Exam    General:  Lying in bed Shivering.  HEENT: normal   Neck: supple.  RIJ swan. LIJ trialysis Carotids 2+ bilat; no bruits. No lymphadenopathy or thryomegaly appreciated. Cor: LVAD hum.  Sternal dressing ok CTx 3 ok Lungs: Clear. Weak effort Abdomen: soft, nontender, non-distended. No hepatosplenomegaly. No bruits or masses. Good  bowel sounds. Driveline site clean. Anchor in place.  Extremities: no cyanosis, clubbing, rash. Warm no edema  Neuro: alert & oriented x 3. No focal deficits. Moves all 4 without problem   Telemetry   Sinus tach 110-125 Personally reviewed  Labs    CBC Recent Labs    03/02/18 0400 03/02/18 1628 03/02/18 1629 03/03/18 0430  WBC 15.7* 11.4*  --  12.7*  NEUTROABS 13.4*  --   --  10.1*  HGB 10.2* 8.5* 8.5* 8.7*  HCT 30.2* 25.6* 25.0* 26.9*  MCV 86.8 88.3  --  89.4  PLT 111* 78*  --  80*   Basic Metabolic Panel Recent Labs    03/02/18 1628 03/02/18 1629 03/03/18 0430  NA 138 137 137  K 4.3 4.3 3.9  CL 105 101 105  CO2 25  --  25  GLUCOSE 190* 193* 102*  BUN 10 10 6   CREATININE 2.82* 2.70* 2.44*  CALCIUM 8.0*  --  7.9*  MG 2.1  --  2.3  PHOS 3.3  --  2.1*   Liver Function Tests Recent Labs    03/02/18 0400 03/02/18 1628 03/03/18 0430  AST 33  --  27  ALT 16  --  15  ALKPHOS 54  --  66  BILITOT 2.5*  --  2.2*  PROT 5.0*  --  5.2*  ALBUMIN 3.0* 2.6* 2.9*   Recent Labs    02/28/18 2108  LIPASE 21   Cardiac Enzymes No results for input(s): CKTOTAL, CKMB, CKMBINDEX, TROPONINI in the last 72 hours.  BNP: BNP (last 3 results) Recent Labs    06/26/17 0004 07/03/17 0037 03/02/18 0400  BNP 1,409.3* 758.9* 694.1*    ProBNP (last 3 results) No results for input(s): PROBNP in the last 8760 hours.   D-Dimer Recent Labs    03/01/18 0924  DDIMER 2.73*   Hemoglobin A1C No results for input(s): HGBA1C in the last 72 hours. Fasting Lipid Panel No results for input(s): CHOL, HDL, LDLCALC, TRIG, CHOLHDL, LDLDIRECT in the last 72 hours. Thyroid Function Tests No results for input(s): TSH, T4TOTAL, T3FREE, THYROIDAB in the last 72 hours.  Invalid input(s): FREET3  Other results:   Imaging    Dg Chest Port 1 View  Result Date: 03/03/2018 CLINICAL DATA:  Patient with left ventricular assist device. EXAM: PORTABLE CHEST 1 VIEW COMPARISON:  Chest  radiograph 03/02/2018. FINDINGS: Multi lead AICD device overlies the left hemithorax, leads are stable in position. PA catheter projects over the main pulmonary artery. Left IJ central venous catheter present. Left chest tube. LVAD device. Interval removal right chest tube. Interval extubation and removal of enteric tube. Cardiomegaly. Low lung volumes. Elevation right hemidiaphragm. Bibasilar heterogeneous opacities. Trace right pleural effusion. No definite pneumothorax. IMPRESSION: Low lung volumes with elevation right hemidiaphragm and bibasilar opacities favored to represent atelectasis. Electronically Signed   By: Lovey Newcomer M.D.   On: 03/03/2018 08:06     Medications:     Scheduled Medications: . acetaminophen  1,000 mg Oral Q6H   Or  . acetaminophen (TYLENOL) oral liquid 160 mg/5 mL  1,000 mg Per Tube Q6H  . aspirin EC  325 mg Oral Daily   Or  . aspirin  324 mg Per Tube Daily   Or  . aspirin  300 mg Rectal Daily  . bisacodyl  10 mg Oral Daily   Or  . bisacodyl  10 mg Rectal Daily  . Chlorhexidine Gluconate Cloth  6 each Topical Daily  . docusate sodium  200 mg Oral Daily  . insulin aspart  0-9 Units Subcutaneous Q4H  . mouth rinse  15 mL Mouth Rinse BID  . metoCLOPramide (REGLAN) injection  10 mg Intravenous Q6H  . pantoprazole  40 mg Oral Daily  . sodium chloride flush  10-40 mL Intracatheter Q12H  . sodium chloride flush  3 mL Intravenous Q12H  . Warfarin - Physician Dosing Inpatient   Does not apply q1800    Infusions: . sodium chloride 10 mL/hr at 03/03/18 0300  . sodium chloride    . sodium chloride 10 mL/hr at 03/02/18 0400  . sodium chloride 10 mL/hr at 03/03/18 0300  . cefUROXime (ZINACEF)  IV    . EPINEPHrine 4 mg in dextrose 5% 250 mL infusion (16 mcg/mL) Stopped (03/02/18 1547)  . lactated ringers Stopped (03/01/18 1659)  . lactated ringers    . magnesium sulfate    . milrinone 0.25 mcg/kg/min (03/03/18 0336)  . norepinephrine (LEVOPHED) Adult infusion  Stopped (03/03/18 1011)  . phenylephrine (NEO-SYNEPHRINE) Adult infusion 30 mcg/min (03/03/18 0300)  . dialysis replacement fluid (prismasate) 400 mL/hr at 03/02/18 2203  . dialysis replacement fluid (prismasate) 200 mL/hr at 03/02/18 2210  . dialysate (PRISMASATE) 1,800 mL/hr at 03/03/18 0930  . 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 Post VAD course complicated by ESRD and severe DM gastroparesis. Admitted with low/no-flow alarms due to LVAD outflow graft kink (does not have bend clip).     Assessment/Plan   1. Cardiogenic shock due to LVAD complication with LVAD outflow graft thrombosis - s/p Emergent LVAD pump exchange 6/27 - now milrinone 0.25, neo 30. Co-ox marginal at 55%. Continue milrinone. Wean neo as tolerated - CVP 8-10 Keep CVVHD even.  - HR remains fast (sinus tach) 110-125. Avoid b-blocker currently if possible - VAD interrogated personally. One low flow event. Likely suction. Otherwise ok - Remove swan and arterial line. Will d/w TCTS and possibly pull PT  2. VAD - One low flow event. Likely suction. Otherwise ok - LDH ok - Coumadin started. INR 1.6-> 3.1. Will hold coumadin today. Discussed dosing with PharmD personally.   3. Intractable Nausea/Vomiting secondary to gastroparesis - Improved.  - Continue reglan, phenergan, and ativan as needed - Can restart erythromycin 250 mg three times a day for gastroparesis as neededs. He has been seen by Dr Derrill Kay on 01/24/18 [facility])  4. ESRD - Now on CVVHD CVP down Will run even. Spoke with HD nurse and RUE AVF seems to still be viable.   5. DM,insulin dependent - SSI  for now  6. Acute respiratory failure - Extubated 6/28  Stable on [place] - CXR will low volumes. Needs IS and flutter valve - Up to chair  - Mobilize   CRITICAL CARE Performed by: [name]  Total critical care time: 35 minutes  Critical care time was exclusive of separately billable procedures and treating other patients.  Critical care was necessary to treat or prevent imminent or life-threatening deterioration.  Critical care was time spent personally by me (independent of midlevel providers or residents) on the following activities: development of treatment plan with patient and/or surrogate as well as nursing, discussions with consultants, evaluation of patient's response to treatment, examination of patient, obtaining history from patient or surrogate, ordering and performing treatments and interventions, ordering and review of laboratory studies, ordering and review of radiographic studies, pulse oximetry and re-evaluation of patient's condition.     Length of Stay: 3  [name], MD  03/03/2018, 10:36 AM  Advanced Heart Failure Team Pager [phone] (M-F; 7a - 4p)  Please contact [facility] for night-coverage after hours (4p -7a ) and weekends on [email or web address]

## 2018-03-03 NOTE — Evaluation (Signed)
Physical Therapy Evaluation Patient Details Name: [name] MRN: [id number] DOB: 03-25-1966 Today's Date: 03/03/2018   History of Present Illness  This 52 y.o. male with h/o LVAD placement, admitted to ED due to low flow alarm.  CT of chest showed twisted outlfow graft with possible thrombus within the graft.  He was taken emergently to OR and underwent VAD exchange.    PMH includes ESRD on HD, DM, ischemic cardiomyopathy, HTN, gastroparesis, DVT, Chron's disease, AICD  Clinical Impression  Orders received for PT evaluation. Patient demonstrates deficits in functional mobility as indicated below. Will benefit from continued skilled PT to address deficits and maximize function. Will see as indicated and progress as tolerated.  OF NOTE: patient on CCVHD during session, thus limited to OOB to chair. Patient tolerated well but required +2 physical assist for mobility at this time. Anticipate patient may need post acute rehabilitation.     Follow Up Recommendations CIR    Equipment Recommendations  (TBD)    Recommendations for Other Services Rehab consult     Precautions / Restrictions Precautions Precautions: Sternal;Other (comment)(LVAD ) Precaution Comments: Pt demonstrates good understanding of sternal precautions  Restrictions Weight Bearing Restrictions: (sternal precautions ) Other Position/Activity Restrictions: LVAD precautions      Mobility  Bed Mobility Overal bed mobility: Needs Assistance Bed Mobility: Supine to Sit     Supine to sit: HOB elevated;Mod assist     General bed mobility comments: assist to move LEs off bed adn assist to scoot hips to EOB   Transfers Overall transfer level: Needs assistance Equipment used: 2 person hand held assist Transfers: Sit to/from [employer] Transfers Sit to Stand: Mod assist;+2 physical assistance;+2 safety/equipment Stand pivot transfers: Mod assist;+2 physical assistance;+2 safety/equipment       General  transfer comment: assist to power up into standing assist to steady, and assist to control descent   Ambulation/Gait                Stairs            Wheelchair Mobility    Modified Rankin (Stroke Patients Only)       Balance Overall balance assessment: Needs assistance Sitting-balance support: Feet supported;No upper extremity supported Sitting balance-[name]: Fair     Standing balance support: No upper extremity supported Standing balance-[name]: Poor Standing balance comment: requires min A for static standing                              Pertinent Vitals/Pain Pain Assessment: Faces Faces Pain Scale: Hurts little more Pain Location: chest  Pain Descriptors / Indicators: Operative site guarding Pain Intervention(s): Monitored during session;Repositioned;Limited activity within patient's tolerance    Home Living Family/patient expects to be discharged to:: Private residence Living Arrangements: Children Available Help at Discharge: Family;Available 24 hours/day Type of Home: House Home Access: Stairs to enter Entrance Stairs-Rails: None Entrance Stairs-Number of Steps: 2 Home Layout: Multi-level;Able to live on main level with bedroom/bathroom Home Equipment: Kasandra Knudsen - single point;Walker - 4 wheels Additional Comments: pt lives with son and daughter, one of which is with him all the time. His bedroom is downstairs but the kids have moved him to the main level so he will only have to do 2 STE    Prior Function Level of Independence: Independent with assistive device(s);Needs assistance   Gait / Transfers Assistance Needed: Uses rollator for gait  ADL's / Homemaking Assistance Needed: Pt indicates  he was performing ADLs mod I   Comments: Pt reports he was supposed to have started cardiac rehab this week      Hand Dominance   Dominant Hand: Right    Extremity/Trunk Assessment   Upper Extremity Assessment Upper Extremity  Assessment: Generalized weakness    Lower Extremity Assessment Lower Extremity Assessment: Generalized weakness       Communication   Communication: No difficulties  Cognition Arousal/Alertness: Awake/alert Behavior During Therapy: Flat affect Overall Cognitive Status: Within Functional Limits for tasks assessed                                        General Comments General comments (skin integrity, edema, etc.): Pt on CVVHD.   HR to 125 with activity.  BP via cuff 71/61      Exercises     Assessment/Plan    PT Assessment Patient needs continued PT services  PT Problem List Decreased strength;Decreased activity tolerance;Decreased balance;Decreased mobility;Cardiopulmonary status limiting activity;Pain       PT Treatment Interventions DME instruction;Stair training;Gait training;Functional mobility training;Therapeutic activities;Therapeutic exercise;Balance training;Patient/family education    PT Goals (Current goals can be found in the Care Plan section)  Acute Rehab PT Goals Patient Stated Goal: Pt did not state specifically  He does indicate he wants to regain independence PT Goal Formulation: With patient Time For Goal Achievement: 03/17/18 Potential to Achieve Goals: Good    Frequency Min 3X/week   Barriers to discharge        Co-evaluation PT/OT/SLP Co-Evaluation/Treatment: Yes Reason for Co-Treatment: Complexity of the patient's impairments (multi-system involvement);For patient/therapist safety PT goals addressed during session: Mobility/safety with mobility OT goals addressed during session: Strengthening/ROM       AM-PAC PT "6 Clicks" Daily Activity  Outcome Measure Difficulty turning over in bed (including adjusting bedclothes, sheets and blankets)?: Unable Difficulty moving from lying on back to sitting on the side of the bed? : Unable Difficulty sitting down on and standing up from a chair with arms (e.g., wheelchair, bedside  commode, etc,.)?: Unable Help needed moving to and from a bed to chair (including a wheelchair)?: A Little Help needed walking in hospital room?: A Lot Help needed climbing 3-5 steps with a railing? : A Lot 6 Click Score: 10    End of Session Equipment Utilized During Treatment: Oxygen Activity Tolerance: Patient limited by fatigue Patient left: in chair;with call bell/phone within reach Nurse Communication: Mobility status PT Visit Diagnosis: Difficulty in walking, not elsewhere classified (R26.2)    Time: 9833-8250 PT Time Calculation (min) (ACUTE ONLY): 27 min   Charges:   PT Evaluation $PT Eval High Complexity: 1 High     PT G Codes:        Alben Deeds, PT DPT  Board Certified Neurologic Specialist [place] 03/03/2018, 3:24 PM

## 2018-03-03 NOTE — Evaluation (Signed)
Occupational Therapy Evaluation Patient Details Name: [name] MRN: [id number] DOB: June 06, 1966 Today's Date: 03/03/2018    History of Present Illness This 52 y.o. male with h/o LVAD placement, admitted to ED due to low flow alarm.  CT of chest showed twisted outlfow graft with possible thrombus within the graft.  He was taken emergently to OR and underwent VAD exchange.    PMH includes ESRD on HD, DM, ischemic cardiomyopathy, HTN, gastroparesis, DVT, Chron's disease, AICD   Clinical Impression   Pt admitted with above. He demonstrates the below listed deficits and will benefit from continued OT to maximize safety and independence with BADLs.  Pt presents to OT with generalized weakness, decreased activity tolerance, impaired balance, pain.  He was able to transfer to chair with mod A +2 and HR 125.  He requires max - total  A for ADLs.  PTA, he lived with son and daughter in law, and was mod I with ADLs and functional mobility.   Anticipate he may need CIR prior to return home, but this will depend on how he progresses acutely.  Will follow.       Follow Up Recommendations  CIR;Supervision/Assistance - 24 hour    Equipment Recommendations  None recommended by OT    Recommendations for Other Services Rehab consult     Precautions / Restrictions Precautions Precautions: Sternal;Other (comment)(LVAD ) Precaution Comments: Pt demonstrates good understanding of sternal precautions  Restrictions Weight Bearing Restrictions: (sternal precautions )      Mobility Bed Mobility Overal bed mobility: Needs Assistance Bed Mobility: Supine to Sit     Supine to sit: HOB elevated;Mod assist     General bed mobility comments: assist to move LEs off bed adn assist to scoot hips to EOB   Transfers Overall transfer level: Needs assistance Equipment used: 2 person hand held assist Transfers: Sit to/from [employer] Transfers Sit to Stand: Mod assist;+2 physical assistance;+2  safety/equipment Stand pivot transfers: Mod assist;+2 physical assistance;+2 safety/equipment       General transfer comment: assist to power up into standing assist to steady, and assist to control descent     Balance Overall balance assessment: Needs assistance Sitting-balance support: Feet supported;No upper extremity supported Sitting balance-[name]: Fair     Standing balance support: No upper extremity supported Standing balance-[name]: Poor Standing balance comment: requires min A for static standing                            ADL either performed or assessed with clinical judgement   ADL Overall ADL's : Needs assistance/impaired Eating/Feeding: Set up;Sitting   Grooming: Wash/dry hands;Wash/dry face;Oral care;Brushing hair;Set up;Sitting   Upper Body Bathing: Maximal assistance;Sitting   Lower Body Bathing: Total assistance;Sit to/from stand   Upper Body Dressing : Maximal assistance;Sitting   Lower Body Dressing: Total assistance;Sit to/from stand   Toilet Transfer: Moderate assistance;+2 for physical assistance;+2 for safety/equipment;Stand-pivot;BSC   Toileting- Clothing Manipulation and Hygiene: Total assistance;Sit to/from stand       Functional mobility during ADLs: Moderate assistance;+2 for physical assistance;+2 for safety/equipment       Vision Baseline Vision/History: Wears glasses Wears Glasses: At all times Patient Visual Report: No change from baseline       Perception     Praxis      Pertinent Vitals/Pain Pain Assessment: Faces Faces Pain Scale: Hurts little more Pain Location: chest  Pain Descriptors / Indicators: Operative site guarding Pain Intervention(s): Monitored  during session;Repositioned;Limited activity within patient's tolerance     Hand Dominance Right   Extremity/Trunk Assessment Upper Extremity Assessment Upper Extremity Assessment: Generalized weakness   Lower Extremity Assessment Lower  Extremity Assessment: Defer to PT evaluation       Communication Communication Communication: No difficulties   Cognition Arousal/Alertness: Awake/alert Behavior During Therapy: Flat affect Overall Cognitive Status: Within Functional Limits for tasks assessed                                     General Comments  Pt on CVVHD.   HR to 125 with activity.  BP via cuff 71/61      Exercises     Shoulder Instructions      Home Living Family/patient expects to be discharged to:: Private residence Living Arrangements: Children Available Help at Discharge: Family;Available 24 hours/day Type of Home: House Home Access: Stairs to enter [employer] of Steps: 2 Entrance Stairs-Rails: None Home Layout: Multi-level;Able to live on main level with bedroom/bathroom Alternate Level Stairs-Number of Steps: flight Alternate Level Stairs-Rails: Left Bathroom Shower/Tub: Tub/shower unit   Bathroom Toilet: Standard     Home Equipment: Cane - single point;Walker - 4 wheels   Additional Comments: pt lives with son and daughter, one of which is with him all the time. His bedroom is downstairs but the kids have moved him to the main level so he will only have to do 2 STE      Prior Functioning/Environment Level of Independence: Independent with assistive device(s);Needs assistance  Gait / Transfers Assistance Needed: Uses rollator for gait ADL's / Homemaking Assistance Needed: Pt indicates he was performing ADLs mod I    Comments: Pt reports he was supposed to have started cardiac rehab this week         OT Problem List: Decreased strength;Decreased activity tolerance;Impaired balance (sitting and/or standing);Cardiopulmonary status limiting activity;Pain      OT Treatment/Interventions: Self-care/ADL training;Energy conservation;DME and/or AE instruction;Therapeutic activities;Patient/family education;Balance training    OT Goals(Current goals can be found in  the care plan section) Acute Rehab OT Goals Patient Stated Goal: Pt did not state specifically  He does indicate he wants to regain independence OT Goal Formulation: With patient Time For Goal Achievement: 03/17/18 Potential to Achieve Goals: Good ADL Goals Pt Will Perform Grooming: with min guard assist;standing Pt Will Perform Upper Body Bathing: with supervision;sitting Pt Will Perform Lower Body Bathing: with min guard assist;with adaptive equipment;sit to/from stand Pt Will Perform Upper Body Dressing: with supervision;sitting Pt Will Perform Lower Body Dressing: with min guard assist;sit to/from stand;with adaptive equipment Pt Will Transfer to Toilet: with min guard assist;ambulating;regular height toilet;bedside commode;grab bars Pt Will Perform Toileting - Clothing Manipulation and hygiene: with min guard assist;sit to/from stand Additional ADL Goal #1: Pt will actively participate in 25 mins therapeutic activity to increased activity tolerance as needed for ADLs  OT Frequency: Min 2X/week   Barriers to D/C:            Co-evaluation PT/OT/SLP Co-Evaluation/Treatment: Yes Reason for Co-Treatment: Complexity of the patient's impairments (multi-system involvement);For patient/therapist safety   OT goals addressed during session: Strengthening/ROM      AM-PAC PT "6 Clicks" Daily Activity     Outcome Measure Help from another person eating meals?: A Little Help from another person taking care of personal grooming?: A Little Help from another person toileting, which includes using toliet, bedpan, or urinal?: A  Lot Help from another person bathing (including washing, rinsing, drying)?: A Lot Help from another person to put on and taking off regular upper body clothing?: A Lot Help from another person to put on and taking off regular lower body clothing?: Total 6 Click Score: 13   End of Session Equipment Utilized During Treatment: Oxygen Nurse Communication: Mobility  status  Activity Tolerance: Patient limited by fatigue Patient left: in chair;with call bell/phone within reach  OT Visit Diagnosis: Unsteadiness on feet (R26.81);Muscle weakness (generalized) (M62.81)                Time: 5465-0354 OT Time Calculation (min): 27 min Charges:  OT General Charges $OT Visit: 1 Visit OT Evaluation $OT Eval High Complexity: 1 High G-Codes:     [facility], OTR/L [phone]   Lucille Passy M 03/03/2018, 1:53 PM

## 2018-03-03 NOTE — Progress Notes (Signed)
Renal panel hemolyzed x2.  Patient currently receiving 1 Unit PRBCs.  On-coming nurse to draw renal panel and post-transfusion H/H after blood completed. [place], Ardeth Sportsman

## 2018-03-04 ENCOUNTER — Inpatient Hospital Stay (HOSPITAL_COMMUNITY): Payer: BLUE CROSS/BLUE SHIELD

## 2018-03-04 LAB — CBC WITH DIFFERENTIAL/PLATELET
Abs Immature Granulocytes: 0.1 10*3/uL (ref 0.0–0.1)
BASOS ABS: 0 10*3/uL (ref 0.0–0.1)
Basophils Relative: 0 %
Eosinophils Absolute: 0.4 10*3/uL (ref 0.0–0.7)
Eosinophils Relative: 4 %
HCT: 26.5 % — ABNORMAL LOW (ref 39.0–52.0)
HEMOGLOBIN: 8.6 g/dL — AB (ref 13.0–17.0)
Immature Granulocytes: 1 %
LYMPHS ABS: 0.7 10*3/uL (ref 0.7–4.0)
LYMPHS PCT: 7 %
MCH: 29.5 pg (ref 26.0–34.0)
MCHC: 32.5 g/dL (ref 30.0–36.0)
MCV: 90.8 fL (ref 78.0–100.0)
Monocytes Absolute: 0.8 10*3/uL (ref 0.1–1.0)
Monocytes Relative: 8 %
NEUTROS ABS: 8 10*3/uL — AB (ref 1.7–7.7)
NEUTROS PCT: 80 %
PLATELETS: 71 10*3/uL — AB (ref 150–400)
RBC: 2.92 MIL/uL — AB (ref 4.22–5.81)
RDW: 16.2 % — ABNORMAL HIGH (ref 11.5–15.5)
WBC: 10 10*3/uL (ref 4.0–10.5)

## 2018-03-04 LAB — GLUCOSE, CAPILLARY
GLUCOSE-CAPILLARY: 93 mg/dL (ref 70–99)
GLUCOSE-CAPILLARY: 123 mg/dL — AB (ref 70–99)
Glucose-Capillary: 107 mg/dL — ABNORMAL HIGH (ref 70–99)
Glucose-Capillary: 113 mg/dL — ABNORMAL HIGH (ref 70–99)
Glucose-Capillary: 146 mg/dL — ABNORMAL HIGH (ref 70–99)
Glucose-Capillary: 96 mg/dL (ref 70–99)

## 2018-03-04 LAB — RENAL FUNCTION PANEL
Albumin: 2.8 g/dL — ABNORMAL LOW (ref 3.5–5.0)
Albumin: 2.8 g/dL — ABNORMAL LOW (ref 3.5–5.0)
Albumin: 2.8 g/dL — ABNORMAL LOW (ref 3.5–5.0)
Anion gap: 9 (ref 5–15)
Anion gap: 6 (ref 5–15)
Anion gap: 5 (ref 5–15)
BUN: 5 mg/dL — ABNORMAL LOW (ref 6–20)
BUN: 5 mg/dL — ABNORMAL LOW (ref 6–20)
BUN: 6 mg/dL (ref 6–20)
CALCIUM: 7.9 mg/dL — AB (ref 8.9–10.3)
CALCIUM: 8.2 mg/dL — AB (ref 8.9–10.3)
CHLORIDE: 103 mmol/L (ref 98–111)
CO2: 24 mmol/L (ref 22–32)
CO2: 27 mmol/L (ref 22–32)
CO2: 28 mmol/L (ref 22–32)
CREATININE: 2.01 mg/dL — AB (ref 0.61–1.24)
CREATININE: 2.05 mg/dL — AB (ref 0.61–1.24)
CREATININE: 2.12 mg/dL — AB (ref 0.61–1.24)
Calcium: 7.8 mg/dL — ABNORMAL LOW (ref 8.9–10.3)
Chloride: 104 mmol/L (ref 98–111)
Chloride: 102 mmol/L (ref 98–111)
GFR calc Af Amer: 42 mL/min — ABNORMAL LOW (ref >60)
GFR calc non Af Amer: 34 mL/min — ABNORMAL LOW (ref >60)
GFR calc non Af Amer: 36 mL/min — ABNORMAL LOW (ref >60)
GFR, EST AFRICAN AMERICAN: 39 mL/min — AB (ref >60)
GFR, EST AFRICAN AMERICAN: 41 mL/min — AB (ref >60)
GFR, EST NON AFRICAN AMERICAN: 35 mL/min — AB (ref >60)
GLUCOSE: 107 mg/dL — AB (ref 70–99)
Glucose, Bld: 103 mg/dL — ABNORMAL HIGH (ref 70–99)
Glucose, Bld: 129 mg/dL — ABNORMAL HIGH (ref 70–99)
Phosphorus: 1.4 mg/dL — ABNORMAL LOW (ref 2.5–4.6)
Phosphorus: 1.1 mg/dL — ABNORMAL LOW (ref 2.5–4.6)
Phosphorus: 1.3 mg/dL — ABNORMAL LOW (ref 2.5–4.6)
Potassium: 4.7 mmol/L (ref 3.5–5.1)
Potassium: 4 mmol/L (ref 3.5–5.1)
Potassium: 5 mmol/L (ref 3.5–5.1)
SODIUM: 135 mmol/L (ref 135–145)
SODIUM: 137 mmol/L (ref 135–145)
Sodium: 136 mmol/L (ref 135–145)

## 2018-03-04 LAB — COMPREHENSIVE METABOLIC PANEL
ALT: 13 U/L (ref 0–44)
AST: 23 U/L (ref 15–41)
Albumin: 2.7 g/dL — ABNORMAL LOW (ref 3.5–5.0)
Alkaline Phosphatase: 68 U/L (ref 38–126)
Anion gap: 5 (ref 5–15)
BUN: 5 mg/dL — ABNORMAL LOW (ref 6–20)
CO2: 27 mmol/L (ref 22–32)
Calcium: 7.9 mg/dL — ABNORMAL LOW (ref 8.9–10.3)
Chloride: 103 mmol/L (ref 98–111)
Creatinine, Ser: 2.07 mg/dL — ABNORMAL HIGH (ref 0.61–1.24)
GFR calc Af Amer: 40 mL/min — ABNORMAL LOW (ref >60)
GFR calc non Af Amer: 35 mL/min — ABNORMAL LOW (ref >60)
Glucose, Bld: 102 mg/dL — ABNORMAL HIGH (ref 70–99)
Potassium: 4.3 mmol/L (ref 3.5–5.1)
Sodium: 135 mmol/L (ref 135–145)
Total Bilirubin: 1.1 mg/dL (ref 0.3–1.2)
Total Protein: 5 g/dL — ABNORMAL LOW (ref 6.5–8.1)

## 2018-03-04 LAB — VANCOMYCIN, TROUGH: Vancomycin Tr: 14 ug/mL — ABNORMAL LOW (ref 15–20)

## 2018-03-04 LAB — COOXEMETRY PANEL
Carboxyhemoglobin: 0 % — ABNORMAL LOW (ref 0.5–1.5)
Carboxyhemoglobin: 1.6 % — ABNORMAL HIGH (ref 0.5–1.5)
METHEMOGLOBIN: 1.8 % — AB (ref 0.0–1.5)
Methemoglobin: 1.6 % — ABNORMAL HIGH (ref 0.0–1.5)
O2 Saturation: 83.7 %
O2 Saturation: 77.7 %
TOTAL HEMOGLOBIN: 9.4 g/dL — AB (ref 12.0–16.0)
Total hemoglobin: 9 g/dL — ABNORMAL LOW (ref 12.0–16.0)

## 2018-03-04 LAB — LACTATE DEHYDROGENASE: LDH: 241 U/L — ABNORMAL HIGH (ref 98–192)

## 2018-03-04 LAB — PROTIME-INR
INR: 4.13
Prothrombin Time: 39.7 seconds — ABNORMAL HIGH (ref 11.4–15.2)

## 2018-03-04 LAB — MAGNESIUM: Magnesium: 2.4 mg/dL (ref 1.7–2.4)

## 2018-03-04 MED ORDER — ERYTHROMYCIN BASE 250 MG PO TABS
250.0000 mg | ORAL_TABLET | Freq: Three times a day (TID) | ORAL | Status: DC
  Filled 2018-03-04 (×2): qty 1

## 2018-03-04 MED ORDER — GLUCERNA SHAKE PO LIQD
237.0000 mL | Freq: Three times a day (TID) | ORAL | Status: DC
  Administered 2018-03-04 – 2018-03-13 (×16): 237 mL via ORAL
  Filled 2018-03-04 (×6): qty 237

## 2018-03-04 MED ORDER — ENSURE ENLIVE PO LIQD: 237.0000 mL | Freq: Three times a day (TID) | ORAL | Status: DC

## 2018-03-04 MED ORDER — ASPIRIN EC 81 MG PO TBEC
81.0000 mg | DELAYED_RELEASE_TABLET | Freq: Every day | ORAL | Status: DC
  Administered 2018-03-05 – 2018-03-19 (×15): 81 mg via ORAL
  Filled 2018-03-04 (×15): qty 1

## 2018-03-04 MED ORDER — HYDRALAZINE HCL 25 MG PO TABS
12.5000 mg | ORAL_TABLET | Freq: Three times a day (TID) | ORAL | Status: DC
  Administered 2018-03-04 – 2018-03-06 (×5): 12.5 mg via ORAL
  Filled 2018-03-04 (×5): qty 1

## 2018-03-04 MED ORDER — VANCOMYCIN HCL IN DEXTROSE 750-5 MG/150ML-% IV SOLN
750.0000 mg | INTRAVENOUS | Status: DC
  Administered 2018-03-04 – 2018-03-05 (×2): 750 mg via INTRAVENOUS
  Filled 2018-03-04 (×4): qty 150

## 2018-03-04 MED ORDER — ERYTHROMYCIN BASE 250 MG PO TABS
250.0000 mg | ORAL_TABLET | Freq: Three times a day (TID) | ORAL | Status: DC
  Administered 2018-03-04 – 2018-03-19 (×41): 250 mg via ORAL
  Filled 2018-03-04 (×47): qty 1

## 2018-03-04 NOTE — Progress Notes (Signed)
4 Days Post-Op Procedure(s) (LRB): TRANSESOPHAGEAL ECHOCARDIOGRAM (TEE) (N/A) REDO INSERTION OF IMPLANTABLE LEFT VENTRICULAR ASSIST DEVICE - HEARTMATE 3 (N/A) Subjective:  Postop day 3 HeartMate 3 LVAD change out for pump thrombus, pump occlusion, 90 degree twisted outflow graft.  [name] Receiving CVVH to remove fluid with preoperative renal failure  Minimal complaints- on po diet Coumadin one dose  Mg with INT supertherpauetic > 3 Minimal chest tube drainage, pocket drain remains VAD parameters with flow < 4L, speed increased to 5300 perop speed was 5500  Objective: Vital signs in last 24 hours: Temp:  [98.3 F (36.8 C)-99.1 F (37.3 C)] 99.1 F (37.3 C) (06/30 1638) Pulse Rate:  [56-121] 117 (06/30 2000) Cardiac Rhythm: Sinus tachycardia (06/30 0820) Resp:  [11-25] 25 (06/30 2000) BP: (71-120)/(0-95) 111/85 (06/30 2000) SpO2:  [91 %-100 %] 94 % (06/30 2000) Weight:  [188 lb 7.9 oz (85.5 kg)] 188 lb 7.9 oz (85.5 kg) (06/30 0300)  Hemodynamic parameters for last 24 hours: CVP:  [8 mmHg-16 mmHg] 12 mmHg  Intake/Output from previous day: 06/29 0701 - 06/30 0700 In: 1960.2 [P.O.:990; I.V.:590; Blood:315; IV Piggyback:55.2] Out: 2210 [Urine:20; Chest Tube:310] Intake/Output this shift: Total I/O In: 6.2 [I.V.:6.2] Out: 46 [Urine:20; Other:26]  Comfortable, responsive breathing spontaneously Lungs clear Normal VAD Hom Abdomen soft Extremities warm Neuro intact  Lab Results: Recent Labs    03/03/18 2213 03/04/18 0423  WBC 10.8* 10.0  HGB 8.9* 8.6*  HCT 27.3* 26.5*  PLT 70* 71*   BMET:  Recent Labs    03/04/18 0423 03/04/18 1545  NA 135  135 136  K 4.3  4.0 5.0  CL 103  102 103  CO2 27  27 28   GLUCOSE 102*  103* 129*  BUN 5*  5* 6  CREATININE 2.07*  2.05* 2.01*  CALCIUM 7.9*  7.9* 7.8*    PT/INR:  Recent Labs    03/04/18 0423  LABPROT 39.7*  INR 4.13*   ABG    Component Value Date/Time   PHART  7.326 (L) 03/02/2018 1635   HCO3 23.6 03/02/2018 1635   TCO2 25 03/02/2018 1635   ACIDBASEDEF 3.0 (H) 03/02/2018 1635   O2SAT 77.7 03/04/2018 0917   CBG (last 3)  Recent Labs    03/04/18 1209 03/04/18 1254 03/04/18 2007  GLUCAP 146* 123* 96    Assessment/Plan: S/P Procedure(s) (LRB): TRANSESOPHAGEAL ECHOCARDIOGRAM (TEE) (N/A) REDO INSERTION OF IMPLANTABLE LEFT VENTRICULAR ASSIST DEVICE - HEARTMATE 3 (N/A) Pressors weaned for rising MAP continue milrinone for RV function Resume Coumadin and oral diet Cont cvvh until approach preop wt- should transitin to HD w/o difficulty with elevated MAP   LOS: 4 days    [name] 03/04/2018

## 2018-03-04 NOTE — Progress Notes (Signed)
[facility] Progress Note  Subjective: doing well, no c/o.    Vitals:   03/04/18 1210 03/04/18 1320 03/04/18 1410 03/04/18 1505  BP:  (!) 113/95 111/89 115/86  Pulse:  (!) 114 (!) 115 (!) 111  Resp:  11 17 17   Temp: 98.3 F (36.8 C)     TempSrc: Oral     SpO2:  100% 100% 100%  Weight:      Height:        Inpatient medications: . acetaminophen  1,000 mg Oral Q6H   Or  . acetaminophen (TYLENOL) oral liquid 160 mg/5 mL  1,000 mg Per Tube Q6H  . [START ON 03/05/2018] aspirin EC  81 mg Oral Daily  . bisacodyl  10 mg Oral Daily   Or  . bisacodyl  10 mg Rectal Daily  . Chlorhexidine Gluconate Cloth  6 each Topical Daily  . docusate sodium  200 mg Oral Daily  . erythromycin  250 mg Oral TID  . feeding supplement (GLUCERNA SHAKE)  237 mL Oral TID BM  . hydrALAZINE  12.5 mg Oral Q8H  . insulin aspart  0-9 Units Subcutaneous Q4H  . mouth rinse  15 mL Mouth Rinse BID  . metoCLOPramide (REGLAN) injection  10 mg Intravenous Q6H  . pantoprazole  40 mg Oral BID  . sodium chloride flush  10-40 mL Intracatheter Q12H  . sodium chloride flush  3 mL Intravenous Q12H  . Warfarin - Physician Dosing Inpatient   Does not apply q1800   . sodium chloride    . sodium chloride Stopped (03/03/18 2210)  . cefUROXime (ZINACEF)  IV 1 g (03/04/18 1045)  . EPINEPHrine 4 mg in dextrose 5% 250 mL infusion (16 mcg/mL) Stopped (03/02/18 1547)  . lactated ringers Stopped (03/01/18 1659)  . lactated ringers    . magnesium sulfate    . milrinone 0.25 mcg/kg/min (03/04/18 1500)  . norepinephrine (LEVOPHED) Adult infusion Stopped (03/03/18 1011)  . phenylephrine (NEO-SYNEPHRINE) Adult infusion Stopped (03/03/18 2300)  . dialysis replacement fluid (prismasate) 400 mL/hr at 03/04/18 1258  . dialysis replacement fluid (prismasate) 200 mL/hr at 03/04/18 0028  . dialysate (PRISMASATE) 1,800 mL/hr at 03/04/18 1441  . sodium chloride    . vancomycin 750 mg (03/04/18 1126)   alteplase, calcium  chloride, heparin, LORazepam, morphine injection, ondansetron (ZOFRAN) IV, oxyCODONE, sodium chloride, sodium chloride flush, sodium chloride flush, traMADol  Exam: Gen intubated on vent, mild facial edema No rash, cyanosis or gangrene Sclera anicteric, throat w ETT  No jvd or bruits Chest clear ant and lat, mult chest tubes and drive-line in ant chest RRR hum of LVAD  Abd soft ntnd no mass or ascites +bs GU normal male MS no joint effusions or deformity Ext 1+ bilat LE edema, no wounds or ulcers Neuro is sedated on the vent    Home meds:  - norvasc 2.5/ hydralazine 37.63m tid  - ecasa/ lipitor/ sildenafil 20 tid/ coumadin 5 hs  - PPI/ reglan 10 tid/ ativan prn/ marinol prn  - lantus 10 u qd/ novolog 5 u tid  Dialysis: MWF [facility]  4h 145m 80kg  3K/ 2Ca  RUA AVG / R IJ TDC  Hep none  - calc 0.5 ug tiw  - mircera 50 q  2, last 6/26  - venofer 50 weekly   Impression: 1  LVAD pump thrombosis - s/p redo sternotomy w/ LVAD pump removal / exchange on 02/28/18 2  ESRD - on CRRT d#3. Keeping even now. Have d/w cardiology, plan  to  start pulling fluid tomorrow if pt  continues to improve.  3  Shock - remains on milrinone; off pressors.  4  DM2 on insulin 5  ID - on IV vanc and cefuroxime empiric Rx 6  Anemia ckd - Hb 8-9, last esa 6/26 at the op unit. Cont to follow.     Plan - as above   Kelly Splinter MD [facility] pager [phone]   03/04/2018, 3:33 PM   Recent Labs  Lab 03/03/18 0430  03/04/18 0053 03/04/18 0423  NA 137   < > 137 135  135  K 3.9   < > 4.7 4.3  4.0  CL 105   < > 104 103  102  CO2 25   < > 24 27  27   GLUCOSE 102*   < > 107* 102*  103*  BUN 6   < > 5* 5*  5*  CREATININE 2.44*   < > 2.12* 2.07*  2.05*  CALCIUM 7.9*   < > 8.2* 7.9*  7.9*  PHOS 2.1*   < > 1.4* 1.1*  ALBUMIN 2.9*   < > 2.8* 2.7*  2.8*  INR 3.07  --   --  4.13*   < > = values in this interval not displayed.   Recent Labs  Lab 03/03/18 0430 03/04/18 0423   AST 27 23  ALT 15 13  ALKPHOS 66 68  BILITOT 2.2* 1.1  PROT 5.2* 5.0*   Recent Labs  Lab 03/03/18 0430  03/03/18 2213 03/04/18 0423  WBC 12.7*   < > 10.8* 10.0  NEUTROABS 10.1*  --   --  8.0*  HGB 8.7*   < > 8.9* 8.6*  HCT 26.9*   < > 27.3* 26.5*  MCV 89.4   < > 91.0 90.8  PLT 80*   < > 70* 71*   < > = values in this interval not displayed.   Iron/TIBC/Ferritin/ %Sat    Component Value Date/Time   IRON 40 (L) 11/02/2017 0016   TIBC 188 (L) 11/02/2017 0016   FERRITIN 523 (H) 11/02/2017 0016   IRONPCTSAT 21 11/02/2017 0016

## 2018-03-04 NOTE — Progress Notes (Signed)
ANTICOAGULATION CONSULT NOTE - Initial Consult  Pharmacy Consult for warfarin Indication: LVAD + Factor V Leiden   Allergies  Allergen Reactions  . Metformin And Related Diarrhea    Patient Measurements: Height: 5' 7"  (170.2 cm) Weight: 188 lb 7.9 oz (85.5 kg) IBW/kg (Calculated) : 66.1   Vital Signs: Temp: 98.7 F (37.1 C) (06/30 0745) Temp Source: Oral (06/30 0745) BP: 109/86 (06/30 0940) Pulse Rate: 64 (06/30 0940)  Labs: Recent Labs    03/02/18 0400  03/03/18 0430 03/03/18 1600 03/03/18 1722 03/03/18 2213 03/04/18 0053 03/04/18 0423  HGB 10.2*   < > 8.7* 7.9*  --  8.9*  --  8.6*  HCT 30.2*   < > 26.9* 24.9*  --  27.3*  --  26.5*  PLT 111*   < > 80* 76*  --  70*  --  71*  APTT 37*  --   --   --   --   --   --   --   LABPROT 19.8*  --  31.5*  --   --   --   --  39.7*  INR 1.70  --  3.07  --   --   --   --  4.13*  CREATININE 3.31*   < > 2.44*  --  2.02*  --  2.12* 2.07*  2.05*   < > = values in this interval not displayed.    Estimated Creatinine Clearance: 43.6 mL/min (A) (by C-G formula based on SCr of 2.05 mg/dL (H)).   Medical History: Past Medical History:  Diagnosis Date  . Angina   . ASCVD (arteriosclerotic cardiovascular disease)    , Anterior infarction 2005, LAD diagonal bifurcation intervention 03/2004  . Automatic implantable cardiac defibrillator -[employer]       . Benign neoplasm of colon   . CHF (congestive heart failure) ([place])   . Chronic systolic heart failure ([place])   . Coronary artery disease     Widely patent previously placed stents in the left anterior   . Crohn's disease ([place])   . 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 Dialysis patient ([place])   . Dyspnea   . Gastroparesis   . GERD (gastroesophageal reflux disease)   . High cholesterol   . Hyperlipidemia   . Hypersomnolent    Previous diagnosis of narcolepsy  . Hypertension, essential   . Ischemic cardiomyopathy    Ejection fraction 15-20% catheterization  2010  . Type II or unspecified type diabetes mellitus without mention of complication, not stated as uncontrolled   . Unspecified gastritis and gastroduodenitis without mention of hemorrhage      Assessment: 52yom with Hx LVAD admitted for low flow alarms and had pump exchange 6/28.  Warfarin started post op INR jumped - 1.7> 3 > 4 today h/h stable low pltc post op watch, no bleeding noted - CT output stable.   Goal of Therapy:  INR 2-3 Monitor platelets by anticoagulation protocol: Yes   Plan:  Hold warfarin today Watch s/s bleeding   Bonnita Nasuti Pharm.D. CPP, BCPS Clinical Pharmacist [phone] 03/04/2018 10:18 AM

## 2018-03-04 NOTE — Progress Notes (Signed)
CRITICAL VALUE ALERT  Critical Value:  INR 4.13  Date & Time Notied:  03/04/18 0531  Provider Notified: will address with MD on morning rounds  Orders Received/Actions taken: n/a

## 2018-03-04 NOTE — Progress Notes (Signed)
Pt modified systolic and MAP trending upward. HF MD notified. New order obtained.Pt stable. Will continue to monitor.

## 2018-03-04 NOTE — Progress Notes (Signed)
Advanced Heart Failure Rounding Note  PCP-Cardiologist: No primary care provider on file.   Subjective:    Remains on CVVHD. Keeping even. CVP ~10. Off neo On Milrinone 0.25. Co-ox 78%  Feeling better today. No CP or SOB.   CXR low volume but ok (Personally reviewed)  No significant bleeding. Hgb stable 8.6.   PT out yesterday. MT out today. Pocket tube left - draining 15/hr. No BM. + flatus   MAPS 90-100  Flow 3.5 Speed 5200  PI 5.1 Power 4.1  VAD interrogated personally. Parameters stable.    Objective:   Weight Range: 85.5 kg (188 lb 7.9 oz) Body mass index is 29.52 kg/m.   Vital Signs:   Temp:  [98.3 F (36.8 C)-99.5 F (37.5 C)] 98.3 F (36.8 C) (06/30 1210) Pulse Rate:  [56-121] 115 (06/30 1200) Resp:  [8-25] 25 (06/30 1200) BP: (71-120)/(0-92) 113/92 (06/30 1200) SpO2:  [91 %-100 %] 100 % (06/30 1200) Weight:  [85.5 kg (188 lb 7.9 oz)] 85.5 kg (188 lb 7.9 oz) (06/30 0300) Last BM Date: (PTA)  Weight change: Filed Weights   03/02/18 1810 03/03/18 0510 03/04/18 0300  Weight: 83 kg (182 lb 15.7 oz) 85.8 kg (189 lb 2.5 oz) 85.5 kg (188 lb 7.9 oz)    Intake/Output:   Intake/Output Summary (Last 24 hours) at 03/04/2018 1312 Last data filed at 03/04/2018 1200 Gross per 24 hour  Intake 1814.68 ml  Output 1722 ml  Net 92.68 ml      Physical Exam    General:  Lying in bed alert but weak HEENT: normal  Neck: supple. RIJ cordis LIJ trialysis Carotids 2+ bilat; no bruits. No lymphadenopathy or thryomegaly appreciated. Cor: LVAD hum.  CT ok. Sternal dressing ok  Lungs: Clear. Anteriroly Abdomen: obese soft, nontender, non-distended. No hepatosplenomegaly. No bruits or masses. Good bowel sounds. Driveline site clean. Anchor in place.  Extremities: no cyanosis, clubbing, rash. Warm no edema  RUE AVF Neuro: alert & oriented x 3. No focal deficits. Moves all 4 without problem    Telemetry   Sinus tach 110-115  Personally reviewed  Labs     CBC Recent Labs    03/03/18 0430  03/03/18 2213 03/04/18 0423  WBC 12.7*   < > 10.8* 10.0  NEUTROABS 10.1*  --   --  8.0*  HGB 8.7*   < > 8.9* 8.6*  HCT 26.9*   < > 27.3* 26.5*  MCV 89.4   < > 91.0 90.8  PLT 80*   < > 70* 71*   < > = values in this interval not displayed.   Basic Metabolic Panel Recent Labs    03/03/18 0430  03/04/18 0053 03/04/18 0423  NA 137   < > 137 135  135  K 3.9   < > 4.7 4.3  4.0  CL 105   < > 104 103  102  CO2 25   < > 24 27  27   GLUCOSE 102*   < > 107* 102*  103*  BUN 6   < > 5* 5*  5*  CREATININE 2.44*   < > 2.12* 2.07*  2.05*  CALCIUM 7.9*   < > 8.2* 7.9*  7.9*  MG 2.3  --   --  2.4  PHOS 2.1*   < > 1.4* 1.1*   < > = values in this interval not displayed.   Liver Function Tests Recent Labs    03/03/18 0430  03/04/18 0053 03/04/18 0423  AST 27  --   --  23  ALT 15  --   --  13  ALKPHOS 66  --   --  68  BILITOT 2.2*  --   --  1.1  PROT 5.2*  --   --  5.0*  ALBUMIN 2.9*   < > 2.8* 2.7*  2.8*   < > = values in this interval not displayed.   No results for input(s): LIPASE, AMYLASE in the last 72 hours. Cardiac Enzymes No results for input(s): CKTOTAL, CKMB, CKMBINDEX, TROPONINI in the last 72 hours.  BNP: BNP (last 3 results) Recent Labs    06/26/17 0004 07/03/17 0037 03/02/18 0400  BNP 1,409.3* 758.9* 694.1*    ProBNP (last 3 results) No results for input(s): PROBNP in the last 8760 hours.   D-Dimer No results for input(s): DDIMER in the last 72 hours. Hemoglobin A1C No results for input(s): HGBA1C in the last 72 hours. Fasting Lipid Panel No results for input(s): CHOL, HDL, LDLCALC, TRIG, CHOLHDL, LDLDIRECT in the last 72 hours. Thyroid Function Tests No results for input(s): TSH, T4TOTAL, T3FREE, THYROIDAB in the last 72 hours.  Invalid input(s): FREET3  Other results:   Imaging    Dg Chest Port 1 View  Result Date: 03/04/2018 CLINICAL DATA:  Patient with 03/03/2018 LVAD. EXAM: PORTABLE CHEST 1  VIEW COMPARISON:  Chest radiograph FINDINGS: Right IJ sheath in place. Interval retraction PA catheter. Dual lead AICD device overlies the left hemithorax. Left IJ central venous catheter tip projects over the central brachiocephalic vein. Interval removal left chest tube. Chest tube inferior left hemithorax. LVAD in place. Stable enlarged cardiac and mediastinal contours. Bibasilar heterogeneous opacities. Elevation right hemidiaphragm. IMPRESSION: Bibasilar opacities favored to represent atelectasis. Electronically Signed   By: Lovey Newcomer M.D.   On: 03/04/2018 08:33     Medications:     Scheduled Medications: . acetaminophen  1,000 mg Oral Q6H   Or  . acetaminophen (TYLENOL) oral liquid 160 mg/5 mL  1,000 mg Per Tube Q6H  . [START ON 03/05/2018] aspirin EC  81 mg Oral Daily  . bisacodyl  10 mg Oral Daily   Or  . bisacodyl  10 mg Rectal Daily  . Chlorhexidine Gluconate Cloth  6 each Topical Daily  . docusate sodium  200 mg Oral Daily  . erythromycin  250 mg Oral TID  . feeding supplement (GLUCERNA SHAKE)  237 mL Oral TID BM  . insulin aspart  0-9 Units Subcutaneous Q4H  . mouth rinse  15 mL Mouth Rinse BID  . metoCLOPramide (REGLAN) injection  10 mg Intravenous Q6H  . pantoprazole  40 mg Oral BID  . sodium chloride flush  10-40 mL Intracatheter Q12H  . sodium chloride flush  3 mL Intravenous Q12H  . Warfarin - Physician Dosing Inpatient   Does not apply q1800    Infusions: . sodium chloride    . sodium chloride Stopped (03/03/18 2210)  . cefUROXime (ZINACEF)  IV 1 g (03/04/18 1045)  . EPINEPHrine 4 mg in dextrose 5% 250 mL infusion (16 mcg/mL) Stopped (03/02/18 1547)  . lactated ringers Stopped (03/01/18 1659)  . lactated ringers    . magnesium sulfate    . milrinone 0.25 mcg/kg/min (03/04/18 1200)  . norepinephrine (LEVOPHED) Adult infusion Stopped (03/03/18 1011)  . phenylephrine (NEO-SYNEPHRINE) Adult infusion Stopped (03/03/18 2300)  . dialysis replacement fluid  (prismasate) 400 mL/hr at 03/04/18 1258  . dialysis replacement fluid (prismasate) 200 mL/hr at 03/04/18 0028  . dialysate (  PRISMASATE) 1,800 mL/hr at 03/04/18 1147  . sodium chloride    . vancomycin 750 mg (03/04/18 1126)    PRN Medications: alteplase, calcium chloride, heparin, LORazepam, morphine injection, ondansetron (ZOFRAN) IV, oxyCODONE, sodium chloride, sodium chloride flush, sodium chloride flush, traMADol    Patient Profile    Mr Mahaney is a 52 year old with a history of CAD and ischemic cardiomyopathy now s/p Heartmate 3 LVAD on 10/18. Post VAD course complicated by ESRD and severe DM gastroparesis. Admitted with low/no-flow alarms due to LVAD outflow graft kink (does not have bend clip).     Assessment/Plan   1. Cardiogenic shock due to LVAD complication with LVAD outflow graft thrombosis - s/p Emergent LVAD pump exchange 6/27 - now milrinone 0.25, Co-ox 77% Continue milrinone for now - CVP 10 on CVVHD keep even.  - Tachycardia (sinus tach) improving 110. Avoid b-blocker currently if possible - VAD interrogated personally. Parameters stable.  2. VAD - VAD interrogated personally. Parameters stable - LDH 241 - Coumadin started. INR 4.1 Hold coumadin again. Discussed dosing with PharmD personally.   3. Intractable Nausea/Vomiting secondary to gastroparesis - Passing flatus. No BM yet - Continue reglan, phenergan, and ativan as needed - Will restart erythromycin 250 mg three times a day for gastroparesis  He has been seen by Dr Derrill Kay on 01/24/18 [facility] G[facility]ropares[facility] Specialist)  4. ESRD - Now on CVVHD CVP down Will run even. Spoke with HD nurse and RUE AVF seems to still be viable.   5. DM,insulin dependent - SSI for now  6. Acute respiratory failure - Extubated 6/28  Stable on [place] - CXR still will low volumes. Encourage IS and flutter valve - Up to chair  - Mobilize   CRITICAL CARE Performed by: [name]  Total critical care time: 35  minutes  Critical care time was exclusive of separately billable procedures and treating other patients.  Critical care was necessary to treat or prevent imminent or life-threatening deterioration.  Critical care was time spent personally by me (independent of midlevel providers or residents) on the following activities: development of treatment plan with patient and/or surrogate as well as nursing, discussions with consultants, evaluation of patient's response to treatment, examination of patient, obtaining history from patient or surrogate, ordering and performing treatments and interventions, ordering and review of laboratory studies, ordering and review of radiographic studies, pulse oximetry and re-evaluation of patient's condition.   Length of Stay: [place], MD  03/04/2018, 1:12 PM  Advanced Heart Failure Team Pager [phone] (M-F; [place])  Please contact [facility] for night-coverage after hours (4p -7a ) and weekends on [email or web address]

## 2018-03-04 NOTE — Progress Notes (Signed)
Pharmacy Antibiotic Note  [name] is a 52 y.o. male admitted on 02/28/2018 for low floa alarms on LVAD > exchange 03/01/18.  Zinacef and vancomycin for post op abx adjusted for CVVHD.  3 CT remain  In place.  Will continue abx over the weekend and dicuss next week with surgeon for LOT.  VT this am 14 at goal.  Afebrile, wbc 11.    Plan: Cefuroxime 1gm iv q12h for CVVHD dosing Vancomycin 733m IV q24h   Height: 5' 7"  (170.2 cm) Weight: 188 lb 7.9 oz (85.5 kg) IBW/kg (Calculated) : 66.1  Temp (24hrs), Avg:98.7 F (37.1 C), Min:98.4 F (36.9 C), Max:99.5 F (37.5 C)  Recent Labs  Lab 03/02/18 1628 03/02/18 1629 03/03/18 0430 03/03/18 1600 03/03/18 1722 03/03/18 2213 03/04/18 0053 03/04/18 0423  WBC 11.4*  --  12.7* 11.3*  --  10.8*  --  10.0  CREATININE 2.82* 2.70* 2.44*  --  2.02*  --  2.12* 2.07*  2.05*  VANCOTROUGH  --   --   --   --   --   --   --  14*    Estimated Creatinine Clearance: 43.6 mL/min (A) (by C-G formula based on SCr of 2.05 mg/dL (H)).    Allergies  Allergen Reactions  . Metformin And Related Diarrhea    Antimicrobials this admission:   Dose adjustments this admission:   Microbiology results:   LBonnita NasutiPharm.D. CPP, BCPS Clinical Pharmacist [phone]/30/2019 10:29 AM

## 2018-03-05 ENCOUNTER — Inpatient Hospital Stay (HOSPITAL_COMMUNITY): Payer: BLUE CROSS/BLUE SHIELD

## 2018-03-05 LAB — TYPE AND SCREEN
ABO/RH(D): O POS
Antibody Screen: NEGATIVE
Unit division: 0
Unit division: 0
Unit division: 0
Unit division: 0
Unit division: 0
Unit division: 0
Unit division: 0
Unit division: 0
Unit division: 0
Unit division: 0
Unit division: 0
Unit division: 0

## 2018-03-05 LAB — PROTIME-INR
INR: 2.37
PROTHROMBIN TIME: 25.7 s — AB (ref 11.4–15.2)

## 2018-03-05 LAB — COMPREHENSIVE METABOLIC PANEL
ALT: 13 U/L (ref 0–44)
AST: 27 U/L (ref 15–41)
Albumin: 2.9 g/dL — ABNORMAL LOW (ref 3.5–5.0)
Alkaline Phosphatase: 83 U/L (ref 38–126)
Anion gap: 10 (ref 5–15)
BUN: 6 mg/dL (ref 6–20)
CO2: 25 mmol/L (ref 22–32)
Calcium: 8 mg/dL — ABNORMAL LOW (ref 8.9–10.3)
Chloride: 100 mmol/L (ref 98–111)
Creatinine, Ser: 2.08 mg/dL — ABNORMAL HIGH (ref 0.61–1.24)
GFR calc Af Amer: 40 mL/min — ABNORMAL LOW (ref >60)
GFR calc non Af Amer: 35 mL/min — ABNORMAL LOW (ref >60)
Glucose, Bld: 118 mg/dL — ABNORMAL HIGH (ref 70–99)
Potassium: 4.7 mmol/L (ref 3.5–5.1)
Sodium: 135 mmol/L (ref 135–145)
Total Bilirubin: 1 mg/dL (ref 0.3–1.2)
Total Protein: 5.3 g/dL — ABNORMAL LOW (ref 6.5–8.1)

## 2018-03-05 LAB — BPAM RBC
Blood Product Expiration Date: 201907222359
Blood Product Expiration Date: 201907222359
Blood Product Expiration Date: 201907252359
Blood Product Expiration Date: 201907252359
Blood Product Expiration Date: 201907252359
Blood Product Expiration Date: 201907252359
Blood Product Expiration Date: 201907262359
Blood Product Expiration Date: 201907262359
Blood Product Expiration Date: 201907262359
Blood Product Expiration Date: 201907262359
Blood Product Expiration Date: 201907262359
Blood Product Expiration Date: 201907272359
ISSUE DATE / TIME: 201906270132
ISSUE DATE / TIME: 201906270132
ISSUE DATE / TIME: 201906270132
ISSUE DATE / TIME: 201906270132
ISSUE DATE / TIME: 201906270529
ISSUE DATE / TIME: 201906270529
ISSUE DATE / TIME: 201906281249
ISSUE DATE / TIME: 201906291751
ISSUE DATE / TIME: 201906301252
ISSUE DATE / TIME: 201906301446
ISSUE DATE / TIME: 201906301556
Unit Type and Rh: 5100
Unit Type and Rh: 5100
Unit Type and Rh: 5100
Unit Type and Rh: 5100
Unit Type and Rh: 5100
Unit Type and Rh: 5100
Unit Type and Rh: 5100
Unit Type and Rh: 5100
Unit Type and Rh: 5100
Unit Type and Rh: 5100
Unit Type and Rh: 5100
Unit Type and Rh: 5100

## 2018-03-05 LAB — GLUCOSE, CAPILLARY
GLUCOSE-CAPILLARY: 124 mg/dL — AB (ref 70–99)
GLUCOSE-CAPILLARY: 71 mg/dL (ref 70–99)
GLUCOSE-CAPILLARY: 144 mg/dL — AB (ref 70–99)
GLUCOSE-CAPILLARY: 137 mg/dL — AB (ref 70–99)
GLUCOSE-CAPILLARY: 123 mg/dL — AB (ref 70–99)
Glucose-Capillary: 152 mg/dL — ABNORMAL HIGH (ref 70–99)
Glucose-Capillary: 156 mg/dL — ABNORMAL HIGH (ref 70–99)

## 2018-03-05 LAB — RENAL FUNCTION PANEL
ALBUMIN: 2.8 g/dL — AB (ref 3.5–5.0)
ANION GAP: 8 (ref 5–15)
Albumin: 3 g/dL — ABNORMAL LOW (ref 3.5–5.0)
Anion gap: 10 (ref 5–15)
BUN: 6 mg/dL (ref 6–20)
BUN: 8 mg/dL (ref 6–20)
CALCIUM: 8.1 mg/dL — AB (ref 8.9–10.3)
CALCIUM: 8.2 mg/dL — AB (ref 8.9–10.3)
CHLORIDE: 102 mmol/L (ref 98–111)
CO2: 27 mmol/L (ref 22–32)
CO2: 25 mmol/L (ref 22–32)
Chloride: 102 mmol/L (ref 98–111)
Creatinine, Ser: 2.06 mg/dL — ABNORMAL HIGH (ref 0.61–1.24)
Creatinine, Ser: 2.76 mg/dL — ABNORMAL HIGH (ref 0.61–1.24)
GFR calc Af Amer: 29 mL/min — ABNORMAL LOW (ref >60)
GFR, EST AFRICAN AMERICAN: 41 mL/min — AB (ref >60)
GFR, EST NON AFRICAN AMERICAN: 35 mL/min — AB (ref >60)
GFR, EST NON AFRICAN AMERICAN: 25 mL/min — AB (ref >60)
Glucose, Bld: 117 mg/dL — ABNORMAL HIGH (ref 70–99)
Glucose, Bld: 143 mg/dL — ABNORMAL HIGH (ref 70–99)
PHOSPHORUS: 1.2 mg/dL — AB (ref 2.5–4.6)
POTASSIUM: 3.9 mmol/L (ref 3.5–5.1)
Phosphorus: 3.6 mg/dL (ref 2.5–4.6)
Potassium: 4.2 mmol/L (ref 3.5–5.1)
SODIUM: 137 mmol/L (ref 135–145)
Sodium: 137 mmol/L (ref 135–145)

## 2018-03-05 LAB — CBC WITH DIFFERENTIAL/PLATELET
Abs Immature Granulocytes: 0.1 10*3/uL (ref 0.0–0.1)
BASOS ABS: 0 10*3/uL (ref 0.0–0.1)
BASOS PCT: 0 %
Eosinophils Absolute: 0.4 10*3/uL (ref 0.0–0.7)
Eosinophils Relative: 4 %
HCT: 28.9 % — ABNORMAL LOW (ref 39.0–52.0)
HEMOGLOBIN: 9.4 g/dL — AB (ref 13.0–17.0)
Immature Granulocytes: 1 %
LYMPHS PCT: 7 %
Lymphs Abs: 0.7 10*3/uL (ref 0.7–4.0)
MCH: 29.4 pg (ref 26.0–34.0)
MCHC: 32.5 g/dL (ref 30.0–36.0)
MCV: 90.3 fL (ref 78.0–100.0)
Monocytes Absolute: 0.8 10*3/uL (ref 0.1–1.0)
Monocytes Relative: 8 %
Neutro Abs: 8.7 10*3/uL — ABNORMAL HIGH (ref 1.7–7.7)
Neutrophils Relative %: 80 %
PLATELETS: 109 10*3/uL — AB (ref 150–400)
RBC: 3.2 MIL/uL — AB (ref 4.22–5.81)
RDW: 16 % — AB (ref 11.5–15.5)
WBC: 10.6 10*3/uL — AB (ref 4.0–10.5)

## 2018-03-05 LAB — MAGNESIUM: Magnesium: 2.4 mg/dL (ref 1.7–2.4)

## 2018-03-05 LAB — COOXEMETRY PANEL
Carboxyhemoglobin: 1.5 % (ref 0.5–1.5)
Methemoglobin: 2.2 % — ABNORMAL HIGH (ref 0.0–1.5)
O2 Saturation: 84.3 %
Total hemoglobin: 8.8 g/dL — ABNORMAL LOW (ref 12.0–16.0)

## 2018-03-05 LAB — LACTATE DEHYDROGENASE: LDH: 304 U/L — AB (ref 98–192)

## 2018-03-05 MED ORDER — CLONAZEPAM 1 MG PO TABS
1.0000 mg | ORAL_TABLET | Freq: Every day | ORAL | Status: DC
Start: 2018-03-05 — End: 2018-03-12
  Administered 2018-03-05 – 2018-03-08 (×4): 1 mg via ORAL
  Filled 2018-03-05 (×4): qty 1

## 2018-03-05 MED ORDER — WARFARIN SODIUM 2 MG PO TABS
2.0000 mg | ORAL_TABLET | Freq: Every day | ORAL | Status: DC
  Administered 2018-03-05: 2 mg via ORAL
  Filled 2018-03-05: qty 1

## 2018-03-05 MED ORDER — CITALOPRAM HYDROBROMIDE 20 MG PO TABS
20.0000 mg | ORAL_TABLET | Freq: Every day | ORAL | Status: DC
  Administered 2018-03-05 – 2018-03-19 (×15): 20 mg via ORAL
  Filled 2018-03-05 (×15): qty 1

## 2018-03-05 MED ORDER — ATORVASTATIN CALCIUM 40 MG PO TABS
40.0000 mg | ORAL_TABLET | Freq: Every day | ORAL | Status: DC
Start: 2018-03-05 — End: 2018-03-19
  Administered 2018-03-05 – 2018-03-18 (×13): 40 mg via ORAL
  Filled 2018-03-05 (×13): qty 1

## 2018-03-05 MED ORDER — SODIUM CHLORIDE 0.9 % IV SOLN
750.0000 mg | INTRAVENOUS | Status: AC
  Administered 2018-03-06: 750 mg via INTRAVENOUS
  Filled 2018-03-05: qty 750

## 2018-03-05 MED ORDER — SODIUM PHOSPHATES 45 MMOLE/15ML IV SOLN
40.0000 mmol | Freq: Once | INTRAVENOUS | Status: AC
  Administered 2018-03-05: 40 mmol via INTRAVENOUS
  Filled 2018-03-05: qty 13.33

## 2018-03-05 MED ORDER — VANCOMYCIN HCL IN DEXTROSE 1-5 GM/200ML-% IV SOLN: 1000.0000 mg | INTRAVENOUS | Status: DC

## 2018-03-05 NOTE — Progress Notes (Addendum)
Advanced Heart Failure Rounding Note  PCP-Cardiologist: No primary care provider on file.   Subjective:    Coox 84.3% on Milrinone 0.25. CVP 12-14  Remains on CVVHD.   Awake and alert. 1 chest tube remains in place. Has now had a BM. Pain well controlled.   CXR this am with small pleural effusions. No new opacity. (Personally reviewed).   Hgb improved at 9.4. No bleeding.    MAPs 80s  LVAD Interrogation HM 3: Speed: 2300 Flow: 3.6 PI: 4.2 Power: 4.0.   Objective:   Weight Range: 185 lb 13.6 oz (84.3 kg) Body mass index is 29.11 kg/m.   Vital Signs:   Temp:  [97.5 F (36.4 C)-99.1 F (37.3 C)] 97.9 F (36.6 C) (07/01 0355) Pulse Rate:  [64-119] 115 (07/01 0800) Resp:  [11-29] 29 (07/01 0800) BP: (81-127)/(0-95) 115/78 (07/01 0800) SpO2:  [92 %-100 %] 96 % (07/01 0800) Weight:  [185 lb 13.6 oz (84.3 kg)] 185 lb 13.6 oz (84.3 kg) (07/01 0600) Last BM Date: 03/04/18  Weight change: Filed Weights   03/03/18 0510 03/04/18 0300 03/05/18 0600  Weight: 189 lb 2.5 oz (85.8 kg) 188 lb 7.9 oz (85.5 kg) 185 lb 13.6 oz (84.3 kg)    Intake/Output:   Intake/Output Summary (Last 24 hours) at 03/05/2018 0805 Last data filed at 03/05/2018 0749 Gross per 24 hour  Intake 1384.16 ml  Output 1555 ml  Net -170.84 ml    Physical Exam   General: Lying in bed. Alert weak.  HEENT: Normal. Neck: Supple, JVP 7-8 cm. RIJ cordis. LIJ trialysis. Carotids OK.  Cardiac:  Mechanical heart sounds with LVAD hum present. Sternal dressing OK.  Lungs:  CTAB, normal effort.  Abdomen:  NT, ND, no HSM. No bruits or masses. +BS  LVAD exit site: Dressing dry and intact. No erythema or drainage.  Extremities:  Warm and dry. No cyanosis, clubbing, rash, or edema. RUE AVF Neuro:  Alert & oriented x 3. Cranial nerves grossly intact. Moves all 4 extremities w/o difficulty. Affect pleasant      Telemetry   Sinus tach 110-115s, personally reviewed.   Labs    CBC Recent Labs    03/04/18 0423  03/05/18 0456  WBC 10.0 10.6*  NEUTROABS 8.0* 8.7*  HGB 8.6* 9.4*  HCT 26.5* 28.9*  MCV 90.8 90.3  PLT 71* 400*   Basic Metabolic Panel Recent Labs    03/04/18 0423 03/04/18 1545 03/05/18 0456  NA 135  135 136 135  137  K 4.3  4.0 5.0 4.7  3.9  CL 103  102 103 100  102  CO2 27  27 28 25  27   GLUCOSE 102*  103* 129* 118*  117*  BUN 5*  5* 6 6  6   CREATININE 2.07*  2.05* 2.01* 2.08*  2.06*  CALCIUM 7.9*  7.9* 7.8* 8.0*  8.1*  MG 2.4  --  2.4  PHOS 1.1* 1.3* 1.2*   Liver Function Tests Recent Labs    03/04/18 0423 03/04/18 1545 03/05/18 0456  AST 23  --  27  ALT 13  --  13  ALKPHOS 68  --  83  BILITOT 1.1  --  1.0  PROT 5.0*  --  5.3*  ALBUMIN 2.7*  2.8* 2.8* 2.9*  2.8*   No results for input(s): LIPASE, AMYLASE in the last 72 hours. Cardiac Enzymes No results for input(s): CKTOTAL, CKMB, CKMBINDEX, TROPONINI in the last 72 hours.  BNP: BNP (last 3 results) Recent Labs  06/26/17 0004 07/03/17 0037 03/02/18 0400  BNP 1,409.3* 758.9* 694.1*    ProBNP (last 3 results) No results for input(s): PROBNP in the last 8760 hours.   D-Dimer No results for input(s): DDIMER in the last 72 hours. Hemoglobin A1C No results for input(s): HGBA1C in the last 72 hours. Fasting Lipid Panel No results for input(s): CHOL, HDL, LDLCALC, TRIG, CHOLHDL, LDLDIRECT in the last 72 hours. Thyroid Function Tests No results for input(s): TSH, T4TOTAL, T3FREE, THYROIDAB in the last 72 hours.  Invalid input(s): FREET3  Other results:   Imaging    Dg Chest Port 1 View  Result Date: 03/05/2018 CLINICAL DATA:  Chest pain EXAM: PORTABLE CHEST 1 VIEW COMPARISON:  March 04, 2018 FINDINGS: Cordis tip is in the superior vena cava. Pacemaker leads are attached to the right atrium, right ventricle, and coronary sinus. There is a chest tube on the left, unchanged. Left ventricular assist device present. No pneumothorax. There is patchy bibasilar atelectatic change with  small pleural effusions bilaterally. No new opacity evident. Heart prominent but stable. The pulmonary vascularity is normal. No adenopathy. No appreciable bone lesions. IMPRESSION: Tube and catheter positions as described without pneumothorax. Bibasilar atelectatic change with small pleural effusions. No new opacity. Stable cardiac silhouette. Electronically Signed   By: Lowella Grip III M.D.   On: 03/05/2018 07:28     Medications:     Scheduled Medications: . acetaminophen  1,000 mg Oral Q6H   Or  . acetaminophen (TYLENOL) oral liquid 160 mg/5 mL  1,000 mg Per Tube Q6H  . aspirin EC  81 mg Oral Daily  . bisacodyl  10 mg Oral Daily   Or  . bisacodyl  10 mg Rectal Daily  . Chlorhexidine Gluconate Cloth  6 each Topical Daily  . citalopram  20 mg Oral Daily  . clonazePAM  1 mg Oral QHS  . docusate sodium  200 mg Oral Daily  . erythromycin  250 mg Oral TID  . feeding supplement (GLUCERNA SHAKE)  237 mL Oral TID BM  . hydrALAZINE  12.5 mg Oral Q8H  . insulin aspart  0-9 Units Subcutaneous Q4H  . mouth rinse  15 mL Mouth Rinse BID  . metoCLOPramide (REGLAN) injection  10 mg Intravenous Q6H  . pantoprazole  40 mg Oral BID  . sodium chloride flush  10-40 mL Intracatheter Q12H  . sodium chloride flush  3 mL Intravenous Q12H  . warfarin  2 mg Oral q1800  . Warfarin - Physician Dosing Inpatient   Does not apply q1800    Infusions: . sodium chloride    . sodium chloride Stopped (03/04/18 2330)  . cefUROXime (ZINACEF)  IV Stopped (03/04/18 2305)  . lactated ringers Stopped (03/01/18 1659)  . lactated ringers    . magnesium sulfate    . milrinone 0.25 mcg/kg/min (03/05/18 0700)  . phenylephrine (NEO-SYNEPHRINE) Adult infusion Stopped (03/03/18 2300)  . dialysis replacement fluid (prismasate) 400 mL/hr at 03/05/18 0205  . dialysis replacement fluid (prismasate) 200 mL/hr at 03/05/18 0234  . dialysate (PRISMASATE) 1,800 mL/hr at 03/05/18 0511  . sodium chloride    . vancomycin  Stopped (03/04/18 1232)    PRN Medications: alteplase, calcium chloride, heparin, LORazepam, morphine injection, ondansetron (ZOFRAN) IV, oxyCODONE, sodium chloride, sodium chloride flush, sodium chloride flush, traMADol    Patient Profile   Mr Haberle is a 52 year old with a history of CAD and ischemic cardiomyopathy now s/p Heartmate 3 LVAD on 10/18. Post VAD course complicated by ESRD and  severe DM gastroparesis. Admitted with low/no-flow alarms due to LVAD outflow graft kink (does not have bend clip).   Assessment/Plan   1. Cardiogenic shock due to LVAD complication with LVAD outflow graft thrombosis - s/p Emergent LVAD pump exchange 6/27 - Coox 84.3% on milrinone 0.25 mcg/kg/min.  - CVP 12-14 on CVVHD keeping even. TCTS has requested transition to IHD so we can get up and moving around.  - Tachycardia (sinus tach) somewhat improved. Avoid b-blocker currently if possible  2. VAD - VAD interrogated personally. Parameters stable.   - LDH 304. - INR 2.37. Coumadin held last night.  - Coumadin started. INR 4.1 Hold coumadin again. Discussed dosing with PharmD personally.   3. Intractable Nausea/Vomiting secondary to gastroparesis - Has now had a BM and able to drink glucerna.  - Continue reglan, phenergan, and ativan as needed - Continue   erythromycin 250 mg TID a day for gastroparesis  He has been seen by Dr Derrill Kay on 01/24/18 [facility] Gastroparesis Specialist)  4. ESRD - Running even on CVVHD - Spoke with HD nurse and RUE AVF seems to still be viable.   5. DM,insulin dependent - SSI for now.   6. Acute respiratory failure - Extubated 6/28  Stable on [place] - CXR OK today. Encouraged IS and flutter valve.  - Up to chair and mobilize. As tolerated  I reviewed the LVAD parameters from today, and compared the results to the patient's prior recorded data.  No programming changes were made.  The LVAD is functioning within specified parameters.  The patient performs LVAD self-test  daily.  LVAD interrogation was negative for any significant power changes, alarms or PI events/speed drops.  LVAD equipment check completed and is in good working order.  Back-up equipment present.   LVAD education done on emergency procedures and precautions and reviewed exit site care.   Length of Stay: [place], [place]  03/05/2018, 8:05 AM  Advanced Heart Failure Team Pager [phone] (M-F; 7a - 4p)  Please contact [facility] for night-coverage after hours (4p -7a ) and weekends on [email or web address]  Patient seen and examined with the above-signed Advanced Practice Provider and/or Housestaff. I personally reviewed laboratory data, imaging studies and relevant notes. I independently examined the patient and formulated the important aspects of the plan. I have edited the note to reflect any of my changes or salient points. I have personally discussed the plan with the patient and/or family.  He continues to progress well. Remains on milrinone 0.25 and CCVHD. Now pulling about 50cc negative. Plans to switch to iHD when filter clots.  Co-ox 84%. MAPs good. VAD interrogated personally. Parameters stable. Still with one CT in. INR 2.3.   On exam Sitting up in bed RIJ TLC and LIJ trialysis Cor Lvad hum + Ct Lungs CT Ab soft  No edema.   Continue to progress. Cut milrinone to 0.125.   [name], MD  9:23 AM

## 2018-03-05 NOTE — Progress Notes (Addendum)
Pt ambulated 270 ft on room air in hallway with avasys walker. Pt tolerated this well.  Lucius Conn, RN

## 2018-03-05 NOTE — Progress Notes (Signed)
CSW met with patient at bedside. He reports he is doing well and positive about recovery. Patient feels very supported by team and his family. CSW will continue to follow for supportive intervention during pump exchange hospitalization. Raquel Sarna, [place], [place]

## 2018-03-05 NOTE — Progress Notes (Signed)
Occupational Therapy Treatment Patient Details Name: [name] MRN: [id number] DOB: 12/13/1965 Today's Date: 03/05/2018    History of present illness This 52 y.o. male with h/o LVAD placement, admitted to ED due to low flow alarm.  CT of chest showed twisted outlfow graft with possible thrombus within the graft.  He was taken emergently to OR and underwent VAD exchange.    PMH includes ESRD on HD, DM, ischemic cardiomyopathy, HTN, gastroparesis, DVT, Chron's disease, AICD   OT comments  Pt seen with PT.  He continues on CVVHD.  He is making excellent progress - required min A +2 for sit <> stand, but able to walk forward and backward 24f x 7 with min guard assist.  HR to 125, and 02 sats >92%.  Will continue to follow.   Follow Up Recommendations  CIR;Supervision/Assistance - 24 hour    Equipment Recommendations  None recommended by OT    Recommendations for Other Services Rehab consult    Precautions / Restrictions Precautions Precautions: Sternal;Other (comment)(LVAD ) Precaution Comments: Pt requires cues for sternal precautions  Restrictions Weight Bearing Restrictions: (sternal precautions ) Other Position/Activity Restrictions: LVAD precautions       Mobility Bed Mobility Overal bed mobility: Needs Assistance Bed Mobility: Supine to Sit     Supine to sit: HOB elevated;Mod assist     General bed mobility comments: assist to move LEs off bed and to scoot hips to EOB   Transfers Overall transfer level: Needs assistance Equipment used: 2 person hand held assist Transfers: Sit to/from Stand;Stand Pivot Transfers Sit to Stand: +2 physical assistance;+2 safety/equipment;Min assist Stand pivot transfers: +2 physical assistance;+2 safety/equipment;Min assist       General transfer comment: assist to boost into standing and assist to control descent into chair     Balance Overall balance assessment: Needs assistance Sitting-balance support: Feet supported;No  upper extremity supported Sitting balance-[name]: Fair Sitting balance - Comments: able to sit EOB with min guard assist    Standing balance support: No upper extremity supported Standing balance-[name]: Poor Standing balance comment: requires light UE support on walker                            ADL either performed or assessed with clinical judgement   ADL                           Toilet Transfer: Minimal assistance;+2 for physical assistance;+2 for safety/equipment;Stand-pivot;BSC;RW           Functional mobility during ADLs: +2 for physical assistance;+2 for safety/equipment;Minimal assistance;Rolling walker       Vision       Perception     Praxis      Cognition Arousal/Alertness: Awake/alert Behavior During Therapy: Flat affect Overall Cognitive Status: Within Functional Limits for tasks assessed                                          Exercises     Shoulder Instructions       General Comments Pt continues on CVVHD.       Pertinent Vitals/ Pain       Pain Assessment: Faces Faces Pain Scale: Hurts little more Pain Location: chest  Pain Descriptors / Indicators: Operative site guarding  Home Living  Prior Functioning/Environment              Frequency  Min 2X/week        Progress Toward Goals  OT Goals(current goals can now be found in the care plan section)  Progress towards OT goals: Progressing toward goals  Acute Rehab OT Goals Patient Stated Goal: Pt did not state specifically  He does indicate he wants to regain independence OT Goal Formulation: With patient Time For Goal Achievement: 03/17/18 Potential to Achieve Goals: Good  Plan Discharge plan remains appropriate    Co-evaluation    PT/OT/SLP Co-Evaluation/Treatment: Yes Reason for Co-Treatment: Complexity of the patient's impairments (multi-system  involvement);For patient/therapist safety   OT goals addressed during session: Strengthening/ROM      AM-PAC PT "6 Clicks" Daily Activity     Outcome Measure   Help from another person eating meals?: A Little Help from another person taking care of personal grooming?: A Little Help from another person toileting, which includes using toliet, bedpan, or urinal?: A Lot Help from another person bathing (including washing, rinsing, drying)?: A Lot Help from another person to put on and taking off regular upper body clothing?: A Lot Help from another person to put on and taking off regular lower body clothing?: Total 6 Click Score: 13    End of Session Equipment Utilized During Treatment: Oxygen  OT Visit Diagnosis: Unsteadiness on feet (R26.81);Muscle weakness (generalized) (M62.81)   Activity Tolerance Patient limited by fatigue   Patient Left in chair;with call bell/phone within reach   Nurse Communication Mobility status        Time: 0902-0929 OT Time Calculation (min): 27 min  Charges: OT General Charges $OT Visit: 1 Visit OT Treatments $Therapeutic Activity: 8-22 mins  [facility], OTR/L [phone]    Lucille Passy M 03/05/2018, 10:29 AM

## 2018-03-05 NOTE — Progress Notes (Signed)
Pt filter clotting. TMP pressures too high. Pt's blood returned. CRRT stopped per verbal order Posey Pronto, MD. HD catheter Heparin locked. MD paged to make aware. Will continue to monitor closely. Pharmacy notified for dosing.   Lucius Conn, RN

## 2018-03-05 NOTE — Progress Notes (Signed)
Physical Therapy Treatment Patient Details Name: [name] MRN: [id number] DOB: [birth year] Today's Date: 03/05/2018    History of Present Illness This 52 y.o. male with h/o LVAD placement, admitted to ED due to low flow alarm.  CT of chest showed twisted outlfow graft with possible thrombus within the graft.  He was taken emergently to OR and underwent VAD exchange.    PMH includes ESRD on HD, DM, ischemic cardiomyopathy, HTN, gastroparesis, DVT, Chron's disease, AICD    PT Comments    Pt pleasant and very willing to get OOB and move. Pt limited by CVVHD but able to stand and walk forward and back at EOB then sit in chair for bil LE HEP. Pt educated for sternal precautions, progression and encouraged to mobilize with nursing particularly after CVVHD stopped this evening. Wll continue to follow.  HR 120-125 with activity 94% on RA    Follow Up Recommendations  CIR     Equipment Recommendations       Recommendations for Other Services       Precautions / Restrictions Precautions Precautions: Sternal Precaution Comments: LVAD, cues and education for precautions Restrictions Weight Bearing Restrictions: (sternal precautions ) Other Position/Activity Restrictions: LVAD precautions    Mobility  Bed Mobility Overal bed mobility: Needs Assistance Bed Mobility: Supine to Sit     Supine to sit: HOB elevated;Mod assist     General bed mobility comments: assist to move LEs off bed and to scoot hips to EOB with cues for arm placement  Transfers Overall transfer level: Needs assistance Equipment used: 2 person hand held assist Transfers: Sit to/from Stand;Stand Pivot Transfers Sit to Stand: +2 physical assistance;+2 safety/equipment;Min assist Stand pivot transfers: +2 physical assistance;+2 safety/equipment;Min assist       General transfer comment: assist to boost into standing and assist to control descent into chair   Ambulation/Gait Ambulation/Gait assistance:  Min assist Gait Distance (Feet): 28 Feet Assistive device: Rolling walker (2 wheeled) Gait Pattern/deviations: Step-through pattern;Decreased stride length     General Gait Details: pt walked 4' forward and back 7x for a total of 28' forward and back limited by CVVHD with support of RW, assist for lines and safety   Stairs             Wheelchair Mobility    Modified Rankin (Stroke Patients Only)       Balance Overall balance assessment: Needs assistance Sitting-balance support: Feet supported;No upper extremity supported Sitting balance-[name]: Fair Sitting balance - Comments: able to sit EOB with min guard assist    Standing balance support: No upper extremity supported Standing balance-[name]: Poor Standing balance comment: requires light UE support on walker                             Cognition Arousal/Alertness: Awake/alert Behavior During Therapy: Flat affect Overall Cognitive Status: Within Functional Limits for tasks assessed                                 General Comments: decreased awareness of sternal precautions with education for all      Exercises General Exercises - Lower Extremity Long Arc Quad: AROM;15 reps;Seated;Both    General Comments General comments (skin integrity, edema, etc.): Pt continues on CVVHD.         Pertinent Vitals/Pain Pain Assessment: Faces Faces Pain Scale: Hurts little more Pain Location: chest  Pain Descriptors / Indicators: Operative site guarding Pain Intervention(s): Limited activity within patient's tolerance;Repositioned;Monitored during session    Home Living                      Prior Function            PT Goals (current goals can now be found in the care plan section) Acute Rehab PT Goals Patient Stated Goal: Pt did not state specifically  He does indicate he wants to regain independence Progress towards PT goals: Progressing toward goals     Frequency    Min 3X/week      PT Plan Current plan remains appropriate    Co-evaluation PT/OT/SLP Co-Evaluation/Treatment: Yes Reason for Co-Treatment: Complexity of the patient's impairments (multi-system involvement) PT goals addressed during session: Mobility/safety with mobility;Proper use of DME;Strengthening/ROM OT goals addressed during session: Strengthening/ROM      AM-PAC PT "6 Clicks" Daily Activity  Outcome Measure  Difficulty turning over in bed (including adjusting bedclothes, sheets and blankets)?: Unable Difficulty moving from lying on back to sitting on the side of the bed? : Unable Difficulty sitting down on and standing up from a chair with arms (e.g., wheelchair, bedside commode, etc,.)?: A Lot Help needed moving to and from a bed to chair (including a wheelchair)?: A Little Help needed walking in hospital room?: A Little Help needed climbing 3-5 steps with a railing? : A Lot 6 Click Score: 12    End of Session   Activity Tolerance: Patient tolerated treatment well Patient left: in chair;with call bell/phone within reach Nurse Communication: Mobility status PT Visit Diagnosis: Difficulty in walking, not elsewhere classified (R26.2)     Time: 1007-1219 PT Time Calculation (min) (ACUTE ONLY): 27 min  Charges:  $Therapeutic Activity: 8-22 mins                    G Codes:       [name], PT [phone]    Kaycee 03/05/2018, 10:47 AM

## 2018-03-05 NOTE — Progress Notes (Signed)
LVAD Coordinator Rounding Note:  Admitted 02/28/18 from the ED for 0 Flow from ED. Pt was taken emergently to the OR for outflow graft twist/obstruction.  HM III LVAD implanted on 06/12/17 by Dr. Prescott Gum under Destination Therapy criteria due to renal insufficiency. Pump exchange on 03/01/18 for outflow graft twist/obstruction.   Pt awake, lying in bed. Denies any complaints. Currently undergoing CVVH.  Vital signs: Temp: 97.4 HR:  113 Doppler Pressure: 104 Auto BP: 115/78 (86) O2 Sat: 97% on RA Wt: 176>166>193>180>185 lbs  LVAD interrogation reveals:  Speed: 5300 Flow: 3.7 Power:  4.2w PI: 4.9 Alarms: none  Events:  none Hematocrit: 29 Fixed speed: 5200 Low speed limit: 4900  Drive Line: Gauze dressing dry and intact; anchor intact and accurately applied.   Labs:  LDH trend: 384>253>241>304  INR trend: 1.70>3.07>4.13>2.37  Blood Products: Intra-op: 03/01/18 - 5 PRBCs     2 PLT     2 FFP     2 Cryo  Gtts: Milrinone 0.25 mcg/kg/min Levo 12 mcg/min (stopped 03/04/18) Neo 25 mcg/min (stopped 03/03/18) Epi 1 mcg/min (stopped 03/03/18)  Anticoagulation Plan: -INR Goal: 2.0 - 3.0 -ASA Dose: 81 mg daily   Device: -St Jude dual  -Therapies: currently off - Monitor: on VT 150 bpm  Adverse Events on VAD: - ESRD post LVAD; started CVVH 06/15/17; HD started 06/20/17 - Hospitalization 11/5 - 07/16/17 > gastroparesis diagnosis after EGD - Hospitalization 11/26 - 08/04/17 > gastroparesis - Hospitalization 1/8 - 09/16/17>gastroparesis - referred to Dr. Corliss Parish at [facility] - 10/31/17>rightupper arm arteriovenous graft with Artegraft; ligation of right first stage brachial vein transposition  - 12/11/13 elective admission for thrombectomy for AV graft in RUA  - 12/20/17 > intractable N/V - 01/19/18 > intractable N/V sent by EMS from dialysis center - 03/01/18 > outflow graft twist/occulsion requiring pump exchange   Plan/Recommendations:  1. VAD coordinator to do dressing change  and pack old drive line site today. 2. Please page VAD coordinator with any equipment questions or concerns.  Zada Girt RN, VAD Coordinator 24/7 VAD Pager: [phone]

## 2018-03-05 NOTE — Progress Notes (Signed)
Patient ID: [name], male   DOB: [birth year], 52 y.o.   MRN: [id number]  [facility] Progress Note   Assessment/ Plan:   1.  Status post left ventricular assist device pump thrombosis: Underwent LVAD pump removal/exchange with redo sternotomy on 02/28/2018.  Appearing to do well postoperatively with plans to now start ambulating him.  On anticoagulation with Coumadin with INR 2.4. 2. End-stage renal disease: Recently on CRRT for volume unloading/clearance given pressor dependent hypotension.  He is now off of pressors and remains on inotropic support with milrinone-plan for continued CRRT for the next 7 hours with ultrafiltration of 50-100 cc/h and thereafter, discontinue CRRT with transition to intermittent hemodialysis beginning tomorrow or Wednesday based on labs/clinical status. 3. Anemia: Hemoglobin/hematocrit stable, continue ESA 4. CKD-MBD: Significant hypophosphatemia secondary to CRRT use-ongoing replacement 5. Nutrition: Continue renal diet, renal multivitamin and supplementation with Glucerna.  Albumin 2.9 6.  Cardiogenic shock: Status post LVAD pump removal/exchange-now off of pressors, weaning down milrinone  Subjective:   Reports to be feeling fair with some chest wall discomfort   Objective:   BP 115/78   Pulse (!) 115   Temp 98.3 F (36.8 C) (Oral)   Resp (!) 29   Ht 5' 7"  (1.702 m)   Wt 84.3 kg (185 lb 13.6 oz)   SpO2 96%   BMI 29.11 kg/m   Physical Exam: Gen: Comfortably resting in bed, sleeping, on CRRT CVS: Pulse regular tachycardia, LVAD hum audible over precordium Resp: Anteriorly clear to auscultation, no rales/rhonchi Abd: Soft, flat, nontender Ext: Trace lower extremity edema, RUA AVG without palpable thrill/audible bruit consistent with baseline  Labs: BMET Recent Labs  Lab 03/02/18 1628 03/02/18 1629 03/03/18 0430 03/03/18 1722 03/04/18 0053 03/04/18 0423 03/04/18 1545 03/05/18 0456  NA 138 137 137 133* 137 135  135 136  135  137  K 4.3 4.3 3.9 6.4* 4.7 4.3  4.0 5.0 4.7  3.9  CL 105 101 105 103 104 103  102 103 100  102  CO2 25  --  25 25 24 27  27 28 25  27   GLUCOSE 190* 193* 102* 178* 107* 102*  103* 129* 118*  117*  BUN 10 10 6 7  5* 5*  5* 6 6  6   CREATININE 2.82* 2.70* 2.44* 2.02* 2.12* 2.07*  2.05* 2.01* 2.08*  2.06*  CALCIUM 8.0*  --  7.9* 7.7* 8.2* 7.9*  7.9* 7.8* 8.0*  8.1*  PHOS 3.3  --  2.1* 1.4* 1.4* 1.1* 1.3* 1.2*   CBC Recent Labs  Lab 03/02/18 0400  03/03/18 0430 03/03/18 1600 03/03/18 2213 03/04/18 0423 03/05/18 0456  WBC 15.7*   < > 12.7* 11.3* 10.8* 10.0 10.6*  NEUTROABS 13.4*  --  10.1*  --   --  8.0* 8.7*  HGB 10.2*   < > 8.7* 7.9* 8.9* 8.6* 9.4*  HCT 30.2*   < > 26.9* 24.9* 27.3* 26.5* 28.9*  MCV 86.8   < > 89.4 91.2 91.0 90.8 90.3  PLT 111*   < > 80* 76* 70* 71* 109*   < > = values in this interval not displayed.    Medications:    . acetaminophen  1,000 mg Oral Q6H   Or  . acetaminophen (TYLENOL) oral liquid 160 mg/5 mL  1,000 mg Per Tube Q6H  . aspirin EC  81 mg Oral Daily  . bisacodyl  10 mg Oral Daily   Or  . bisacodyl  10 mg Rectal Daily  .  Chlorhexidine Gluconate Cloth  6 each Topical Daily  . citalopram  20 mg Oral Daily  . clonazePAM  1 mg Oral QHS  . docusate sodium  200 mg Oral Daily  . erythromycin  250 mg Oral TID  . feeding supplement (GLUCERNA SHAKE)  237 mL Oral TID BM  . hydrALAZINE  12.5 mg Oral Q8H  . insulin aspart  0-9 Units Subcutaneous Q4H  . mouth rinse  15 mL Mouth Rinse BID  . metoCLOPramide (REGLAN) injection  10 mg Intravenous Q6H  . pantoprazole  40 mg Oral BID  . sodium chloride flush  10-40 mL Intracatheter Q12H  . sodium chloride flush  3 mL Intravenous Q12H  . warfarin  2 mg Oral q1800  . Warfarin - Physician Dosing Inpatient   Does not apply [id number]   Elmarie Shiley, MD 03/05/2018, 8:23 AM

## 2018-03-05 NOTE — Progress Notes (Signed)
Pharmacy Antibiotic Note  [name] is a 52 y.o. male with LVAD with pump thrombosis and pump exchange. Pharmacy consult to dose vancomycin. He is noted with ESRD and last dose of vancomycin was 1235m given pre-op today.  CRRT clogged off and plan to transition back to intermittent HD (similar to prior to admission). WBC 10.6, afebrile. On day #5 of empiric antibiotics. Currently, still has 1 chest tube.   Plan:  -Change vancomycin to 1000 mg IV every MWF with hemodialysis (will address as needed pending HD schedule) -Change zinacef to 1 gm IV every 24 hours -Will defer to physicians for total duration of empiric antibiotics   Height: 5' 7"  (170.2 cm) Weight: 185 lb 13.6 oz (84.3 kg) IBW/kg (Calculated) : 66.1  Temp (24hrs), Avg:98.2 F (36.8 C), Min:97.4 F (36.3 C), Max:99.1 F (37.3 C)  Recent Labs  Lab 03/03/18 0430 03/03/18 1600 03/03/18 1722 03/03/18 2213 03/04/18 0053 03/04/18 0423 03/04/18 1545 03/05/18 0456  WBC 12.7* 11.3*  --  10.8*  --  10.0  --  10.6*  CREATININE 2.44*  --  2.02*  --  2.12* 2.07*  2.05* 2.01* 2.08*  2.06*  VANCOTROUGH  --   --   --   --   --  14*  --   --     Estimated Creatinine Clearance: 43.1 mL/min (A) (by C-G formula based on SCr of 2.06 mg/dL (H)).    Allergies  Allergen Reactions  . Metformin And Related Diarrhea   KDoylene Canard PharmD Clinical Pharmacist  Pager: [phone]: [phone]

## 2018-03-05 NOTE — Progress Notes (Signed)
Pt sat up in recliner for 2 hours. IS and flutter valve done. Pt now resting comfortably in bed. Will continue to monitor.  Lucius Conn, RN

## 2018-03-06 ENCOUNTER — Ambulatory Visit (HOSPITAL_COMMUNITY): Payer: Self-pay

## 2018-03-06 ENCOUNTER — Inpatient Hospital Stay (HOSPITAL_COMMUNITY): Payer: BLUE CROSS/BLUE SHIELD

## 2018-03-06 ENCOUNTER — Encounter (HOSPITAL_COMMUNITY): Payer: Self-pay

## 2018-03-06 LAB — GLUCOSE, CAPILLARY
GLUCOSE-CAPILLARY: 129 mg/dL — AB (ref 70–99)
GLUCOSE-CAPILLARY: 121 mg/dL — AB (ref 70–99)
Glucose-Capillary: 103 mg/dL — ABNORMAL HIGH (ref 70–99)
Glucose-Capillary: 135 mg/dL — ABNORMAL HIGH (ref 70–99)
Glucose-Capillary: 185 mg/dL — ABNORMAL HIGH (ref 70–99)
Glucose-Capillary: 208 mg/dL — ABNORMAL HIGH (ref 70–99)

## 2018-03-06 LAB — CBC WITH DIFFERENTIAL/PLATELET
Abs Immature Granulocytes: 0 10*3/uL (ref 0.0–0.1)
Basophils Absolute: 0 10*3/uL (ref 0.0–0.1)
Basophils Relative: 0 %
Eosinophils Absolute: 0.7 10*3/uL (ref 0.0–0.7)
Eosinophils Relative: 7 %
HEMATOCRIT: 28.2 % — AB (ref 39.0–52.0)
HEMOGLOBIN: 9.1 g/dL — AB (ref 13.0–17.0)
IMMATURE GRANULOCYTES: 0 %
LYMPHS PCT: 9 %
Lymphs Abs: 0.8 10*3/uL (ref 0.7–4.0)
MCH: 29.3 pg (ref 26.0–34.0)
MCHC: 32.3 g/dL (ref 30.0–36.0)
MCV: 90.7 fL (ref 78.0–100.0)
Monocytes Absolute: 1 10*3/uL (ref 0.1–1.0)
Monocytes Relative: 11 %
NEUTROS PCT: 73 %
Neutro Abs: 6.5 10*3/uL (ref 1.7–7.7)
Platelets: 123 10*3/uL — ABNORMAL LOW (ref 150–400)
RBC: 3.11 MIL/uL — AB (ref 4.22–5.81)
RDW: 15.9 % — ABNORMAL HIGH (ref 11.5–15.5)
WBC: 9 10*3/uL (ref 4.0–10.5)

## 2018-03-06 LAB — RENAL FUNCTION PANEL
ANION GAP: 7 (ref 5–15)
ANION GAP: 9 (ref 5–15)
Albumin: 2.6 g/dL — ABNORMAL LOW (ref 3.5–5.0)
Albumin: 2.5 g/dL — ABNORMAL LOW (ref 3.5–5.0)
BUN: 12 mg/dL (ref 6–20)
BUN: 15 mg/dL (ref 6–20)
CHLORIDE: 102 mmol/L (ref 98–111)
CHLORIDE: 103 mmol/L (ref 98–111)
CO2: 26 mmol/L (ref 22–32)
CO2: 25 mmol/L (ref 22–32)
Calcium: 7.7 mg/dL — ABNORMAL LOW (ref 8.9–10.3)
Calcium: 7.7 mg/dL — ABNORMAL LOW (ref 8.9–10.3)
Creatinine, Ser: 3.29 mg/dL — ABNORMAL HIGH (ref 0.61–1.24)
Creatinine, Ser: 4.07 mg/dL — ABNORMAL HIGH (ref 0.61–1.24)
GFR calc non Af Amer: 20 mL/min — ABNORMAL LOW (ref >60)
GFR calc non Af Amer: 15 mL/min — ABNORMAL LOW (ref >60)
GFR, EST AFRICAN AMERICAN: 23 mL/min — AB (ref >60)
GFR, EST AFRICAN AMERICAN: 18 mL/min — AB (ref >60)
Glucose, Bld: 124 mg/dL — ABNORMAL HIGH (ref 70–99)
Glucose, Bld: 142 mg/dL — ABNORMAL HIGH (ref 70–99)
POTASSIUM: 4.1 mmol/L (ref 3.5–5.1)
Phosphorus: 3.4 mg/dL (ref 2.5–4.6)
Phosphorus: 3.1 mg/dL (ref 2.5–4.6)
Potassium: 3.9 mmol/L (ref 3.5–5.1)
Sodium: 135 mmol/L (ref 135–145)
Sodium: 137 mmol/L (ref 135–145)

## 2018-03-06 LAB — LACTATE DEHYDROGENASE: LDH: 217 U/L — AB (ref 98–192)

## 2018-03-06 LAB — MAGNESIUM: Magnesium: 2.5 mg/dL — ABNORMAL HIGH (ref 1.7–2.4)

## 2018-03-06 LAB — PROTIME-INR
INR: 1.95
Prothrombin Time: 22.1 seconds — ABNORMAL HIGH (ref 11.4–15.2)

## 2018-03-06 LAB — COOXEMETRY PANEL
Carboxyhemoglobin: 1.9 % — ABNORMAL HIGH (ref 0.5–1.5)
METHEMOGLOBIN: 1.3 % (ref 0.0–1.5)
O2 Saturation: 69.7 %
TOTAL HEMOGLOBIN: 12.9 g/dL (ref 12.0–16.0)

## 2018-03-06 MED ORDER — WARFARIN SODIUM 3 MG PO TABS
3.0000 mg | ORAL_TABLET | Freq: Every day | ORAL | Status: DC
  Administered 2018-03-06: 3 mg via ORAL
  Filled 2018-03-06: qty 1

## 2018-03-06 NOTE — Progress Notes (Addendum)
LVAD Coordinator Rounding Note:  Admitted 02/28/18 from the ED for 0 Flow from ED. Pt was taken emergently to the OR for outflow graft twist/obstruction.  HM III LVAD implanted on 06/12/17 by Dr. Prescott Gum under Destination Therapy criteria due to renal insufficiency. Pump exchange on 03/01/18 for outflow graft twist/obstruction.   Pt awake, sitting in chair at bedside. Says he just completed walking 295 ft. Denies complaints.   Vital signs: Temp: 98.9 HR:  108 Doppler Pressure:  82 Auto BP:  102/87 (93) O2 Sat: 96% on RA Wt: 176>166>193>180>185>184 lbs  LVAD interrogation reveals:  Speed: 5300 Flow: 4.1 Power: 4.2w PI: 3.5 Alarms: none  Events:  none Hematocrit: 28 Fixed speed: 5200 Low speed limit: 4900  Drive Line: Gauze dressing dry and intact; anchor intact and accurately applied. Existing VAD dressing removed and site care performed using sterile technique. Drive line exit site cleaned with Chlora prep applicators x 2, allowed to dry, and gauzedressing with silver stripre-applied. Exit site healingand unincorporated, the velour is fully implanted at exit site.2 stitches intact. Brown serous drainage with no redness, tenderness, foul odor or rash noted.Drive line anchors intactx 2.   Removed iodoform from old left abdominal driveline site and repacked with 2" iodoform using sterile technique. Covered with a 4 x 4 and tape.  Labs:  LDH trend: 384>253>241>304>217  INR trend: 1.70>3.07>4.13>2.37>1.95  Blood Products: Intra-op: 03/01/18 - 5 PRBCs     2 PLT     2 FFP     2 Cryo  Gtts: Milrinone 0.125 mcg/kg/min Levo 12 mcg/min (stopped 03/04/18) Neo 25 mcg/min (stopped 03/03/18) Epi 1 mcg/min (stopped 03/03/18)  Anticoagulation Plan: -INR Goal: 2.0 - 3.0 -ASA Dose: 81 mg daily   Device: -St Jude dual  -Therapies: currently off - Monitor: on VT 150 bpm  Adverse Events on VAD: - ESRD post LVAD; started CVVH 06/15/17; HD started 06/20/17 - Hospitalization  11/5 - 07/16/17 > gastroparesis diagnosis after EGD - Hospitalization 11/26 - 08/04/17 > gastroparesis - Hospitalization 1/8 - 09/16/17>gastroparesis - referred to Dr. Corliss Parish at [facility] [place] - 10/31/17>rightupper arm arteriovenous graft with Artegraft; ligation of right first stage brachial vein transposition  - 12/11/13 elective admission for thrombectomy for AV graft in RUA  - 12/20/17 > intractable N/V - 01/19/18 > intractable N/V sent by EMS from dialysis center - 03/01/18 > outflow graft twist/occulsion requiring pump exchange   Plan/Recommendations:  1. VAD coordinator to do dressing change and pack old drive line site today. 2. Please page VAD coordinator with any equipment questions or concerns.  Zada Girt RN, VAD Coordinator 24/7 VAD Pager: [phone]

## 2018-03-06 NOTE — Progress Notes (Signed)
  Drive Line:Existing VAD dressing removed and site care performed using sterile technique. Drive line exit site cleaned with Chlora prep applicators x 2, allowed to dry, and gauzedressing with silver stripre-applied. Exit site healingand unincorporated, the velour is fully implanted at exit site.2 stitches intact.No redness, tenderness, foul odor or rash noted. Small amount of dark red/brown drainage. Drive line anchors intact  x 2.  Removed iodoform from old left abdominal driveline site and repacked with 2" iodoform using sterile technique. Covered with a 4 x 4 and tape.  Zada Girt RN, VAD Coordinator 24/7 VAD Pager: [phone]

## 2018-03-06 NOTE — Progress Notes (Addendum)
Advanced Heart Failure Rounding Note  PCP-Cardiologist: No primary care provider on file.   Subjective:    Coox 69.7% on Milrinone 0.125 mcg/kg/min. CVP 6  CVVHD stopped 03/05/18. Last CT pulled.   Feeling better overall. Plan for IHD tomorrow. Denies SOB, dizziness, or lightheadedness.   Hgb 9.1. Denies bleeding. Last CT removed today. Feels weak at times.   MAPs 70-80s  CXR this am looks good. Personally reviewed   LVAD Interrogation HM 3: Speed: 5300 Flow: 3.7 PI: 4.5 Power: 4.0. VAD interrogated personally. Parameters stable.    Objective:   Weight Range: 184 lb 4.9 oz (83.6 kg) Body mass index is 28.87 kg/m.   Vital Signs:   Temp:  [97.4 F (36.3 C)-99.1 F (37.3 C)] 98 F (36.7 C) (07/02 0740) Pulse Rate:  [48-115] 102 (07/02 0705) Resp:  [3-29] 15 (07/02 0705) BP: (79-128)/(46-92) 85/66 (07/02 0705) SpO2:  [93 %-100 %] 98 % (07/02 0705) Weight:  [184 lb 4.9 oz (83.6 kg)] 184 lb 4.9 oz (83.6 kg) (07/02 0600) Last BM Date: 03/05/18  Weight change: Filed Weights   03/04/18 0300 03/05/18 0600 03/06/18 0600  Weight: 188 lb 7.9 oz (85.5 kg) 185 lb 13.6 oz (84.3 kg) 184 lb 4.9 oz (83.6 kg)    Intake/Output:   Intake/Output Summary (Last 24 hours) at 03/06/2018 0755 Last data filed at 03/06/2018 0700 Gross per 24 hour  Intake 1123.31 ml  Output 712 ml  Net 411.31 ml    Physical Exam   General: Reclined in chair. NAD.  HEENT: Normal. Neck: Supple, JVP 6-7 cm. Carotids OK. RIJ TL, LIJ trialysis Cardiac:  Mechanical heart sounds with LVAD hum present. Sternal dressing OK.  Lungs:  CTAB, normal effort.  Abdomen:  NT, ND, no HSM. No bruits or masses. +BS  LVAD exit site: Dressing dry and intact. No erythema or drainage. Stabilization device present and accurately applied. Driveline dressing changed daily per sterile technique. Extremities:  Warm and dry. No cyanosis, clubbing, rash, or edema. RUE AVF Neuro: alert & oriented x 3, cranial nerves grossly  intact. moves all 4 extremities w/o difficulty. Affect pleasant    Telemetry   Sinus tach 100-110s, personally reviewed.   Labs    CBC Recent Labs    03/05/18 0456 03/06/18 0349  WBC 10.6* 9.0  NEUTROABS 8.7* 6.5  HGB 9.4* 9.1*  HCT 28.9* 28.2*  MCV 90.3 90.7  PLT 109* 300*   Basic Metabolic Panel Recent Labs    03/05/18 0456 03/05/18 2048 03/06/18 0349  NA 135  137 137 135  K 4.7  3.9 4.2 3.9  CL 100  102 102 102  CO2 25  27 25 26   GLUCOSE 118*  117* 143* 124*  BUN 6  6 8 12   CREATININE 2.08*  2.06* 2.76* 3.29*  CALCIUM 8.0*  8.1* 8.2* 7.7*  MG 2.4  --  2.5*  PHOS 1.2* 3.6 3.4   Liver Function Tests Recent Labs    03/04/18 0423  03/05/18 0456 03/05/18 2048 03/06/18 0349  AST 23  --  27  --   --   ALT 13  --  13  --   --   ALKPHOS 68  --  83  --   --   BILITOT 1.1  --  1.0  --   --   PROT 5.0*  --  5.3*  --   --   ALBUMIN 2.7*  2.8*   < > 2.9*  2.8* 3.0* 2.6*   < > =  values in this interval not displayed.   No results for input(s): LIPASE, AMYLASE in the last 72 hours. Cardiac Enzymes No results for input(s): CKTOTAL, CKMB, CKMBINDEX, TROPONINI in the last 72 hours.  BNP: BNP (last 3 results) Recent Labs    06/26/17 0004 07/03/17 0037 03/02/18 0400  BNP 1,409.3* 758.9* 694.1*    ProBNP (last 3 results) No results for input(s): PROBNP in the last 8760 hours.   D-Dimer No results for input(s): DDIMER in the last 72 hours. Hemoglobin A1C No results for input(s): HGBA1C in the last 72 hours. Fasting Lipid Panel No results for input(s): CHOL, HDL, LDLCALC, TRIG, CHOLHDL, LDLDIRECT in the last 72 hours. Thyroid Function Tests No results for input(s): TSH, T4TOTAL, T3FREE, THYROIDAB in the last 72 hours.  Invalid input(s): FREET3  Other results:   Imaging    Dg Chest Port 1 View  Result Date: 03/06/2018 CLINICAL DATA:  Left ventricular assistance device. EXAM: PORTABLE CHEST 1 VIEW COMPARISON:  Radiograph March 05, 2018.  FINDINGS: Stable cardiomegaly. Left ventricular assist device is unchanged in position. Left-sided pacemaker is unchanged in position. Right internal jugular sheath is unchanged. Left internal jugular catheter is unchanged. No pneumothorax is noted. Bony thorax is unremarkable. Minimal bibasilar subsegmental atelectasis is noted. IMPRESSION: Minimal bibasilar subsegmental atelectasis. Left ventricular device is unchanged. Stable other support apparatus. Electronically Signed   By: Marijo Conception, M.D.   On: 03/06/2018 07:20     Medications:     Scheduled Medications: . acetaminophen  1,000 mg Oral Q6H   Or  . acetaminophen (TYLENOL) oral liquid 160 mg/5 mL  1,000 mg Per Tube Q6H  . aspirin EC  81 mg Oral Daily  . atorvastatin  40 mg Oral q1800  . bisacodyl  10 mg Oral Daily   Or  . bisacodyl  10 mg Rectal Daily  . Chlorhexidine Gluconate Cloth  6 each Topical Daily  . citalopram  20 mg Oral Daily  . clonazePAM  1 mg Oral QHS  . docusate sodium  200 mg Oral Daily  . erythromycin  250 mg Oral TID  . feeding supplement (GLUCERNA SHAKE)  237 mL Oral TID BM  . hydrALAZINE  12.5 mg Oral Q8H  . insulin aspart  0-9 Units Subcutaneous Q4H  . mouth rinse  15 mL Mouth Rinse BID  . metoCLOPramide (REGLAN) injection  10 mg Intravenous Q6H  . pantoprazole  40 mg Oral BID  . sodium chloride flush  10-40 mL Intracatheter Q12H  . sodium chloride flush  3 mL Intravenous Q12H  . warfarin  2 mg Oral q1800  . Warfarin - Physician Dosing Inpatient   Does not apply q1800    Infusions: . sodium chloride    . sodium chloride Stopped (03/04/18 2330)  . cefUROXime (ZINACEF)  IV    . lactated ringers Stopped (03/01/18 1659)  . lactated ringers    . magnesium sulfate    . milrinone 0.125 mcg/kg/min (03/06/18 0700)  . phenylephrine (NEO-SYNEPHRINE) Adult infusion Stopped (03/03/18 2300)  . dialysis replacement fluid (prismasate) 400 mL/hr at 03/05/18 0205  . dialysis replacement fluid (prismasate) 200  mL/hr at 03/05/18 0234  . dialysate (PRISMASATE) 1,800 mL/hr at 03/05/18 1057  . sodium chloride    . [START ON 03/07/2018] vancomycin      PRN Medications: alteplase, calcium chloride, heparin, LORazepam, morphine injection, ondansetron (ZOFRAN) IV, oxyCODONE, sodium chloride, sodium chloride flush, sodium chloride flush, traMADol    Patient Profile   Mr Lazarus is a 53  year old with a history of CAD and ischemic cardiomyopathy now s/p Heartmate 3 LVAD on 10/18. Post VAD course complicated by ESRD and severe DM gastroparesis. Admitted with low/no-flow alarms due to LVAD outflow graft kink (does not have bend clip).   Assessment/Plan   1. Cardiogenic shock due to LVAD complication with LVAD outflow graft thrombosis - s/p Emergent LVAD pump exchange 6/27 - Coox 69.7% on milrinone 0.125 mcg/kg/min. Continue wean.  - CVP 6-7.  - CVVHD stopped 03/05/18. Plan for IHD tomorrow, 03/07/18..  - Tachycardia (sinus tach) somewhat improved. Avoid b-blocker currently if possible  2. VAD - VAD interrogated personally. Parameters stable.   - LDH 217. - INR 1.95. Coumadin held last night. Discussed dosing with PharmD personally.  3. Intractable Nausea/Vomiting secondary to gastroparesis - Has now had a BM and able to drink glucerna.  - Continue reglan, phenergan, and ativan as needed - Continue erythromycin 250 mg TID a day for gastroparesis  He has been seen by Dr Derrill Kay on 01/24/18 [facility]) - No change to current plan.    4. ESRD - CVVHD stopped 03/05/18.  - Plan for IHD tomorrow.   5. DM,insulin dependent - SSI for now.   6. Acute respiratory failure - Extubated 6/28  Stable on [place].  - CXR this am stable. Encouraged IS and flutter valve.  - Up to chair and mobilize. As tolerated  I reviewed the LVAD parameters from today, and compared the results to the patient's prior recorded data.  No programming changes were made.  The LVAD is functioning within specified  parameters.  The patient performs LVAD self-test daily.  LVAD interrogation was negative for any significant power changes, alarms or PI events/speed drops.  LVAD equipment check completed and is in good working order.  Back-up equipment present.   LVAD education done on emergency procedures and precautions and reviewed exit site care.   Length of Stay: [address]  Annamaria Helling  03/06/2018, 7:55 AM  Advanced Heart Failure Team Pager [phone] (M-F; 7a - 4p)  Please contact [facility] for night-coverage after hours (4p -7a ) and weekends on [email or web address]  Patient seen and examined with the above-signed Advanced Practice Provider and/or Housestaff. I personally reviewed laboratory data, imaging studies and relevant notes. I independently examined the patient and formulated the important aspects of the plan. I have edited the note to reflect any of my changes or salient points. I have personally discussed the plan with the patient and/or family.  He remains on CVVHD but volume status looks good. On miltrnone 0.125 with good co-ox though may not be reliable given presence of AVF. Last CT pulled today. INR 1.95. Discussed dosing with PharmD personally. Will continue milrinone 1 more day. Switch to [facility] tomorrow. Should likely be able to get out of ICU tomorrow. VAD interrogated personally. Parameters stable.  [name], MD  10:42 AM

## 2018-03-06 NOTE — Progress Notes (Signed)
Patient ID: [name], male   DOB: 12-21-1965, 52 y.o.   MRN: [id number]  [facility] Progress Note   Assessment/ Plan:   1.  Status post left ventricular assist device pump thrombosis: Underwent LVAD pump removal/exchange with redo sternotomy on 02/28/2018.  Appearing to do well postoperatively with plans to now start ambulating him.  On anticoagulation with Coumadin with INR 1.9. 2. End-stage renal disease: Earlier on CRRT for clearance/volume unloading while dependent on pressors/inotropes.  CRRT discontinued yesterday with the plan to transition over to intermittent hemodialysis beginning tomorrow-labs reviewed, volume status reassessed and he does not have any indications for acute dialysis today. 3. Anemia: Hemoglobin/hematocrit stable, continue ESA 4. CKD-MBD: Phosphorus level acceptable after replacement with sodium phosphate-monitor sequential labs. 5. Nutrition: Continue renal diet, renal multivitamin and supplementation with Glucerna.  Albumin 2.9 6.  Cardiogenic shock: Status post LVAD pump removal/exchange-now off of pressors, weaning down milrinone  Subjective:   Reports to be feeling fair and wanting to try and ambulate today   Objective:   BP (!) 85/66   Pulse (!) 102   Temp 98 F (36.7 C) (Oral)   Resp 15   Ht 5' 7"  (1.702 m)   Wt 83.6 kg (184 lb 4.9 oz)   SpO2 98%   BMI 28.87 kg/m   Physical Exam: Gen: Comfortably resting in recliner  CVS: Pulse regular tachycardia, LVAD hum audible over precordium Resp: Anteriorly clear to auscultation, no rales/rhonchi Abd: Soft, flat, nontender Ext: Trace lower extremity edema, RUA AVG with soft hum consistent with baseline  Labs: BMET Recent Labs  Lab 03/03/18 1722 03/04/18 0053 03/04/18 0423 03/04/18 1545 03/05/18 0456 03/05/18 2048 03/06/18 0349  NA 133* 137 135  135 136 135  137 137 135  K 6.4* 4.7 4.3  4.0 5.0 4.7  3.9 4.2 3.9  CL 103 104 103  102 103 100  102 102 102  CO2 25 24 27   27 28 25  27 25 26   GLUCOSE 178* 107* 102*  103* 129* 118*  117* 143* 124*  BUN 7 5* 5*  5* 6 6  6 8 12   CREATININE 2.02* 2.12* 2.07*  2.05* 2.01* 2.08*  2.06* 2.76* 3.29*  CALCIUM 7.7* 8.2* 7.9*  7.9* 7.8* 8.0*  8.1* 8.2* 7.7*  PHOS 1.4* 1.4* 1.1* 1.3* 1.2* 3.6 3.4   CBC Recent Labs  Lab 03/03/18 0430  03/03/18 2213 03/04/18 0423 03/05/18 0456 03/06/18 0349  WBC 12.7*   < > 10.8* 10.0 10.6* 9.0  NEUTROABS 10.1*  --   --  8.0* 8.7* 6.5  HGB 8.7*   < > 8.9* 8.6* 9.4* 9.1*  HCT 26.9*   < > 27.3* 26.5* 28.9* 28.2*  MCV 89.4   < > 91.0 90.8 90.3 90.7  PLT 80*   < > 70* 71* 109* 123*   < > = values in this interval not displayed.    Medications:    . acetaminophen  1,000 mg Oral Q6H   Or  . acetaminophen (TYLENOL) oral liquid 160 mg/5 mL  1,000 mg Per Tube Q6H  . aspirin EC  81 mg Oral Daily  . atorvastatin  40 mg Oral q1800  . bisacodyl  10 mg Oral Daily   Or  . bisacodyl  10 mg Rectal Daily  . Chlorhexidine Gluconate Cloth  6 each Topical Daily  . citalopram  20 mg Oral Daily  . clonazePAM  1 mg Oral QHS  . docusate sodium  200 mg  Oral Daily  . erythromycin  250 mg Oral TID  . feeding supplement (GLUCERNA SHAKE)  237 mL Oral TID BM  . hydrALAZINE  12.5 mg Oral Q8H  . insulin aspart  0-9 Units Subcutaneous Q4H  . mouth rinse  15 mL Mouth Rinse BID  . metoCLOPramide (REGLAN) injection  10 mg Intravenous Q6H  . pantoprazole  40 mg Oral BID  . sodium chloride flush  10-40 mL Intracatheter Q12H  . sodium chloride flush  3 mL Intravenous Q12H  . warfarin  2 mg Oral q1800  . Warfarin - Physician Dosing Inpatient   Does not apply [id number]   Elmarie Shiley, MD 03/06/2018, 7:55 AM

## 2018-03-06 NOTE — Progress Notes (Signed)
Patient in great spirits! Pt ambulated 370 ft with avasys walker on room air. Pt now resting comfortably in recliner.    Lucius Conn, RN

## 2018-03-06 NOTE — Progress Notes (Addendum)
6 Days Post-Op Procedure(s) (LRB): TRANSESOPHAGEAL ECHOCARDIOGRAM (TEE) (N/A) REDO INSERTION OF IMPLANTABLE LEFT VENTRICULAR ASSIST DEVICE - HEARTMATE 3 (N/A) Subjective:  Postop day 4 HeartMate 3 LVAD change out for pump thrombus, pump occlusion, 90 degree twisted outflow graft.  [name] Receiving CVVH to remove fluid with preoperative renal failure  Minimal complaints- on po diet Coumadin dose appears to be in the range of 2.5 mg Minimal chest tube drainage, pocket drain remains minimal and tube removed today  VAD parameters with flow < 4L, speed increased to 5300 perop speed was 5500  Patient transitioned off CVVH to HD  Feeling stronger and ambulating in hallway.  Objective: Vital signs in last 24 hours: Temp:  [98 F (36.7 C)-99.1 F (37.3 C)] 98.4 F (36.9 C) (07/02 1604) Pulse Rate:  [47-112] 53 (07/02 1800) Cardiac Rhythm: Sinus tachycardia (07/02 0800) Resp:  [0-27] 21 (07/02 1800) BP: (79-106)/(47-87) 87/71 (07/02 1800) SpO2:  [94 %-100 %] 98 % (07/02 1800) Weight:  [184 lb 4.9 oz (83.6 kg)] 184 lb 4.9 oz (83.6 kg) (07/02 0600)  Hemodynamic parameters for last 24 hours: CVP:  [10 mmHg] 10 mmHg  Intake/Output from previous day: 07/01 0701 - 07/02 0700 In: 1123.3 [P.O.:574; I.V.:78.7; IV Piggyback:470.6] Out: 712 [Chest Tube:55] Intake/Output this shift: Total I/O In: 295.8 [P.O.:262; I.V.:33.8] Out: -   Comfortable, responsive breathing spontaneously Lungs clear Normal VAD Hom Abdomen soft Extremities warm Neuro intact  Lab Results: Recent Labs    03/05/18 0456 03/06/18 0349  WBC 10.6* 9.0  HGB 9.4* 9.1*  HCT 28.9* 28.2*  PLT 109* 123*   BMET:  Recent Labs    03/06/18 0349 03/06/18 1632  NA 135 137  K 3.9 4.1  CL 102 103  CO2 26 25  GLUCOSE 124* 142*  BUN 12 15  CREATININE 3.29* 4.07*  CALCIUM 7.7* 7.7*    PT/INR:  Recent Labs    03/06/18 0349  LABPROT 22.1*  INR 1.95   ABG     Component Value Date/Time   PHART 7.326 (L) 03/02/2018 1635   HCO3 23.6 03/02/2018 1635   TCO2 25 03/02/2018 1635   ACIDBASEDEF 3.0 (H) 03/02/2018 1635   O2SAT 69.7 03/06/2018 0403   CBG (last 3)  Recent Labs    03/06/18 0738 03/06/18 1137 03/06/18 1601  GLUCAP 103* 208* 121*    Assessment/Plan: S/P Procedure(s) (LRB): TRANSESOPHAGEAL ECHOCARDIOGRAM (TEE) (N/A) REDO INSERTION OF IMPLANTABLE LEFT VENTRICULAR ASSIST DEVICE - HEARTMATE 3 (N/A) Pressors weaned  continue milrinone for RV function Resume Coumadin and oral diet Cont cvvh until approach preop wt- should transitin to HD w/o difficulty with elevated MAP Pack old driveline tunnel with iodoform daily  LOS: 6 days    Tharon Aquas Trigt III 03/06/2018

## 2018-03-07 ENCOUNTER — Inpatient Hospital Stay (HOSPITAL_COMMUNITY): Payer: BLUE CROSS/BLUE SHIELD

## 2018-03-07 LAB — RENAL FUNCTION PANEL
ALBUMIN: 2.5 g/dL — AB (ref 3.5–5.0)
Albumin: 2.5 g/dL — ABNORMAL LOW (ref 3.5–5.0)
Anion gap: 9 (ref 5–15)
Anion gap: 9 (ref 5–15)
BUN: 19 mg/dL (ref 6–20)
BUN: 8 mg/dL (ref 6–20)
CALCIUM: 7.7 mg/dL — AB (ref 8.9–10.3)
CHLORIDE: 100 mmol/L (ref 98–111)
CO2: 23 mmol/L (ref 22–32)
CO2: 30 mmol/L (ref 22–32)
CREATININE: 4.8 mg/dL — AB (ref 0.61–1.24)
CREATININE: 2.57 mg/dL — AB (ref 0.61–1.24)
Calcium: 7.6 mg/dL — ABNORMAL LOW (ref 8.9–10.3)
Chloride: 100 mmol/L (ref 98–111)
GFR calc Af Amer: 31 mL/min — ABNORMAL LOW (ref >60)
GFR calc non Af Amer: 13 mL/min — ABNORMAL LOW (ref >60)
GFR, EST AFRICAN AMERICAN: 15 mL/min — AB (ref >60)
GFR, EST NON AFRICAN AMERICAN: 27 mL/min — AB (ref >60)
Glucose, Bld: 119 mg/dL — ABNORMAL HIGH (ref 70–99)
Glucose, Bld: 175 mg/dL — ABNORMAL HIGH (ref 70–99)
Phosphorus: 3.3 mg/dL (ref 2.5–4.6)
Phosphorus: 2 mg/dL — ABNORMAL LOW (ref 2.5–4.6)
Potassium: 4.2 mmol/L (ref 3.5–5.1)
Potassium: 4 mmol/L (ref 3.5–5.1)
Sodium: 132 mmol/L — ABNORMAL LOW (ref 135–145)
Sodium: 139 mmol/L (ref 135–145)

## 2018-03-07 LAB — GLUCOSE, CAPILLARY
GLUCOSE-CAPILLARY: 104 mg/dL — AB (ref 70–99)
GLUCOSE-CAPILLARY: 163 mg/dL — AB (ref 70–99)
GLUCOSE-CAPILLARY: 188 mg/dL — AB (ref 70–99)
Glucose-Capillary: 112 mg/dL — ABNORMAL HIGH (ref 70–99)
Glucose-Capillary: 123 mg/dL — ABNORMAL HIGH (ref 70–99)
Glucose-Capillary: 129 mg/dL — ABNORMAL HIGH (ref 70–99)

## 2018-03-07 LAB — CBC WITH DIFFERENTIAL/PLATELET
Abs Immature Granulocytes: 0.1 10*3/uL (ref 0.0–0.1)
Basophils Absolute: 0 10*3/uL (ref 0.0–0.1)
Basophils Relative: 0 %
Eosinophils Absolute: 0.7 10*3/uL (ref 0.0–0.7)
Eosinophils Relative: 6 %
HEMATOCRIT: 28.7 % — AB (ref 39.0–52.0)
HEMOGLOBIN: 9.2 g/dL — AB (ref 13.0–17.0)
IMMATURE GRANULOCYTES: 1 %
LYMPHS ABS: 1.3 10*3/uL (ref 0.7–4.0)
LYMPHS PCT: 11 %
MCH: 29.2 pg (ref 26.0–34.0)
MCHC: 32.1 g/dL (ref 30.0–36.0)
MCV: 91.1 fL (ref 78.0–100.0)
Monocytes Absolute: 1.3 10*3/uL — ABNORMAL HIGH (ref 0.1–1.0)
Monocytes Relative: 11 %
NEUTROS PCT: 71 %
Neutro Abs: 8.1 10*3/uL — ABNORMAL HIGH (ref 1.7–7.7)
Platelets: 159 10*3/uL (ref 150–400)
RBC: 3.15 MIL/uL — AB (ref 4.22–5.81)
RDW: 16.2 % — ABNORMAL HIGH (ref 11.5–15.5)
WBC: 11.5 10*3/uL — AB (ref 4.0–10.5)

## 2018-03-07 LAB — PROTIME-INR
INR: 1.68
Prothrombin Time: 19.6 seconds — ABNORMAL HIGH (ref 11.4–15.2)

## 2018-03-07 LAB — COOXEMETRY PANEL
Carboxyhemoglobin: 1.9 % — ABNORMAL HIGH (ref 0.5–1.5)
METHEMOGLOBIN: 1.4 % (ref 0.0–1.5)
O2 Saturation: 82.1 %
TOTAL HEMOGLOBIN: 9.7 g/dL — AB (ref 12.0–16.0)

## 2018-03-07 LAB — MAGNESIUM: Magnesium: 2.2 mg/dL (ref 1.7–2.4)

## 2018-03-07 LAB — LACTATE DEHYDROGENASE: LDH: 207 U/L — ABNORMAL HIGH (ref 98–192)

## 2018-03-07 MED ORDER — INSULIN ASPART 100 UNIT/ML ~~LOC~~ SOLN: 0.0000 [IU] | Freq: Every day | SUBCUTANEOUS | Status: DC

## 2018-03-07 MED ORDER — INSULIN ASPART 100 UNIT/ML ~~LOC~~ SOLN
0.0000 [IU] | Freq: Three times a day (TID) | SUBCUTANEOUS | Status: DC
  Administered 2018-03-08 (×2): 2 [IU] via SUBCUTANEOUS
  Administered 2018-03-09: 3 [IU] via SUBCUTANEOUS
  Administered 2018-03-09: 2 [IU] via SUBCUTANEOUS
  Administered 2018-03-10: 1 [IU] via SUBCUTANEOUS
  Administered 2018-03-10: 2 [IU] via SUBCUTANEOUS
  Administered 2018-03-10 – 2018-03-13 (×5): 1 [IU] via SUBCUTANEOUS
  Administered 2018-03-14: 2 [IU] via SUBCUTANEOUS
  Administered 2018-03-14: 1 [IU] via SUBCUTANEOUS
  Administered 2018-03-15: 2 [IU] via SUBCUTANEOUS
  Administered 2018-03-15: 1 [IU] via SUBCUTANEOUS
  Administered 2018-03-15 – 2018-03-16 (×2): 2 [IU] via SUBCUTANEOUS
  Administered 2018-03-16: 1 [IU] via SUBCUTANEOUS
  Administered 2018-03-17 (×2): 3 [IU] via SUBCUTANEOUS
  Administered 2018-03-17 – 2018-03-18 (×2): 1 [IU] via SUBCUTANEOUS
  Administered 2018-03-18: 5 [IU] via SUBCUTANEOUS
  Administered 2018-03-18: 2 [IU] via SUBCUTANEOUS

## 2018-03-07 MED ORDER — HYDRALAZINE HCL 25 MG PO TABS
12.5000 mg | ORAL_TABLET | ORAL | Status: DC
  Administered 2018-03-08 (×2): 12.5 mg via ORAL
  Filled 2018-03-07 (×2): qty 1

## 2018-03-07 MED ORDER — WARFARIN SODIUM 5 MG PO TABS
5.0000 mg | ORAL_TABLET | Freq: Every day | ORAL | Status: DC
  Administered 2018-03-07 – 2018-03-08 (×2): 5 mg via ORAL
  Filled 2018-03-07 (×2): qty 1

## 2018-03-07 MED ORDER — WARFARIN - PHYSICIAN DOSING INPATIENT: Freq: Every day | Status: DC

## 2018-03-07 MED ORDER — WARFARIN - PHARMACIST DOSING INPATIENT
Freq: Every day | Status: DC
  Administered 2018-03-08 – 2018-03-09 (×2)
  Administered 2018-03-10 – 2018-03-12 (×2): 1
  Administered 2018-03-14: 18:00:00
  Administered 2018-03-15: 1
  Administered 2018-03-16 – 2018-03-17 (×2)

## 2018-03-07 NOTE — Progress Notes (Signed)
Physical Therapy Treatment Patient Details Name: [name] MRN: [id number] DOB: July 01, 1966 Today's Date: 03/07/2018    History of Present Illness Pt is a 52 y.o. male with h/o LVAD placement, admitted to ED due to low flow alarm. CT of chest showed twisted outlfow graft with possible thrombus within the graft.  He was taken emergently to OR and underwent VAD exchange. Pt requiring CRRT during hospitalization, changed to HD on 03/07/18.   PMH includes ESRD on HD, DM, ischemic cardiomyopathy, HTN, gastroparesis, DVT, Chron's disease, AICD.   PT Comments    Pt progressing very well with mobility. Ambulating with rollator at supervision-level. Intermittent cues to maintain sternal precautions while mobilizing, especially when standing and repositioning in chair. MinA for transitioning to battery-power with LVAD. HR up to 121 while ambulating. Pt will have necessary assist from children upon return home. PTA, ambulated with rollator. D/c recs updated to home. Pt motivated to participate in outpatient cardiac rehab with hopes for a heart/kidney transplant one day.   Follow Up Recommendations  No PT follow up;Supervision - Intermittent;Other (comment)(OP cardiac rehab)     Equipment Recommendations  None recommended by PT    Recommendations for Other Services       Precautions / Restrictions Precautions Precautions: Sternal Precaution Comments: LVAD Restrictions Weight Bearing Restrictions: Yes Other Position/Activity Restrictions: LVAD precautions    Mobility  Bed Mobility Overal bed mobility: Needs Assistance Bed Mobility: Supine to Sit     Supine to sit: Min assist;HOB elevated     General bed mobility comments: min assist to elevate trunk  Transfers Overall transfer level: Needs assistance Equipment used: 4-wheeled walker(rollator) Transfers: Sit to/from Stand Sit to Stand: Min guard         General transfer comment: Pt initially trying to pull on rollator, then  cued for precautions and pt placing hands on knees  Ambulation/Gait Ambulation/Gait assistance: Supervision Gait Distance (Feet): 400 Feet Assistive device: 4-wheeled walker(rollator) Gait Pattern/deviations: Step-through pattern;Decreased stride length Gait velocity: Decreased Gait velocity interpretation: 1.31 - 2.62 ft/sec, indicative of limited community ambulator General Gait Details: Supervision for lines/safety   Stairs             Wheelchair Mobility    Modified Rankin (Stroke Patients Only)       Balance Overall balance assessment: Needs assistance Sitting-balance support: Feet supported;No upper extremity supported Sitting balance-[name]: Good     Standing balance support: Bilateral upper extremity supported Standing balance-[name]: Poor Standing balance comment: requires light UE support on rollator                            Cognition Arousal/Alertness: Awake/alert Behavior During Therapy: WFL for tasks assessed/performed Overall Cognitive Status: Within Functional Limits for tasks assessed                                 General Comments: cues for sternal precautions with sit<>stand      Exercises      General Comments        Pertinent Vitals/Pain Pain Assessment: No/denies pain    Home Living                      Prior Function            PT Goals (current goals can now be found in the care plan section) Acute Rehab PT Goals  Patient Stated Goal: to be alive for his 103 month old grandson PT Goal Formulation: With patient Time For Goal Achievement: 03/17/18 Potential to Achieve Goals: Good Progress towards PT goals: Progressing toward goals    Frequency    Min 3X/week      PT Plan Current plan remains appropriate    Co-evaluation PT/OT/SLP Co-Evaluation/Treatment: Yes Reason for Co-Treatment: For patient/therapist safety;To address functional/ADL transfers PT goals addressed  during session: Mobility/safety with mobility;Balance;Proper use of DME OT goals addressed during session: Strengthening/ROM      AM-PAC PT "6 Clicks" Daily Activity  Outcome Measure  Difficulty turning over in bed (including adjusting bedclothes, sheets and blankets)?: A Little Difficulty moving from lying on back to sitting on the side of the bed? : A Little Difficulty sitting down on and standing up from a chair with arms (e.g., wheelchair, bedside commode, etc,.)?: A Little Help needed moving to and from a bed to chair (including a wheelchair)?: A Little Help needed walking in hospital room?: A Little Help needed climbing 3-5 steps with a railing? : A Little 6 Click Score: 18    End of Session   Activity Tolerance: Patient tolerated treatment well Patient left: in chair;with call bell/phone within reach Nurse Communication: Mobility status PT Visit Diagnosis: Difficulty in walking, not elsewhere classified (R26.2)     Time: 9233-0076 PT Time Calculation (min) (ACUTE ONLY): 33 min  Charges:  $Gait Training: 8-22 mins                    G Codes:      Mabeline Caras, PT, DPT Acute Rehab Services  Pager: [place] 03/07/2018, 4:19 PM

## 2018-03-07 NOTE — Progress Notes (Signed)
L. HD cath removed per MD order. Patient tolerated procedure well and VSS throughout removal. Bedrest will be up at 2:30 on 7/3. Patient resting comfortably in room. Will continue to monitor closely.

## 2018-03-07 NOTE — Progress Notes (Signed)
Patient ID: [name], male   DOB: 22-Jan-1966, 52 y.o.   MRN: [id number]  [facility] Progress Note   Assessment/ Plan:   1.  Status post left ventricular assist device pump thrombosis: Underwent LVAD pump removal/exchange with redo sternotomy on 02/28/2018.  Doing well postoperatively with ongoing care of previous driveline tunneled site per thoracic surgery recommendations.  On Coumadin with INR 1.7.  Increasing leukocytosis noted- will discontinue left IJ HD catheter after successful use of RUA AVG. 2. End-stage renal disease: Postoperatively on CRRT for clearance/volume unloading while on pressors/inotropes.  He is transitioning to intermittent hemodialysis beginning today with attempts at ultrafiltration with MAP cutoff of 60.  We will use right upper arm AVG and discontinue left IJ temporary catheter if successful. 3. 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 5. Nutrition: Continue renal diet, renal multivitamin and supplementation with Glucerna.  Albumin 2.9 6.  Hyponatremia: Secondary to free water intake in patient with ESRD-monitor with ultrafiltration/dialysis.  Subjective:   Denies any acute events overnight, no complaints at this time.   Objective:   BP 117/82 (BP Location: Left Arm)   Pulse (!) 108   Temp 98.2 F (36.8 C) (Oral)   Resp 18   Ht 5' 7"  (1.702 m)   Wt 86.5 kg (190 lb 11.2 oz)   SpO2 99%   BMI 29.87 kg/m   Physical Exam: Gen: Comfortably resting in bed, getting prepared for dialysis CVS: Pulse regular tachycardia, LVAD hum audible over precordium Resp: Anteriorly clear to auscultation, no rales/rhonchi Abd: Soft, flat, nontender Ext: Trace lower extremity edema, RUA AVG with soft hum consistent with baseline  Labs: BMET Recent Labs  Lab 03/04/18 0423 03/04/18 1545 03/05/18 0456 03/05/18 2048 03/06/18 0349 03/06/18 1632  03/07/18 0401  NA 135  135 136 135  137 137 135 137 132*  K 4.3  4.0 5.0 4.7  3.9 4.2 3.9 4.1 4.2  CL 103  102 103 100  102 102 102 103 100  CO2 27  27 28 25  27 25 26 25 23   GLUCOSE 102*  103* 129* 118*  117* 143* 124* 142* 119*  BUN 5*  5* 6 6  6 8 12 15 19   CREATININE 2.07*  2.05* 2.01* 2.08*  2.06* 2.76* 3.29* 4.07* 4.80*  CALCIUM 7.9*  7.9* 7.8* 8.0*  8.1* 8.2* 7.7* 7.7* 7.6*  PHOS 1.1* 1.3* 1.2* 3.6 3.4 3.1 3.3   CBC Recent Labs  Lab 03/04/18 0423 03/05/18 0456 03/06/18 0349 03/07/18 0400  WBC 10.0 10.6* 9.0 11.5*  NEUTROABS 8.0* 8.7* 6.5 8.1*  HGB 8.6* 9.4* 9.1* 9.2*  HCT 26.5* 28.9* 28.2* 28.7*  MCV 90.8 90.3 90.7 91.1  PLT 71* 109* 123* 159    Medications:    . aspirin EC  81 mg Oral Daily  . atorvastatin  40 mg Oral q1800  . bisacodyl  10 mg Oral Daily   Or  . bisacodyl  10 mg Rectal Daily  . Chlorhexidine Gluconate Cloth  6 each Topical Daily  . citalopram  20 mg Oral Daily  . clonazePAM  1 mg Oral QHS  . docusate sodium  200 mg Oral Daily  . erythromycin  250 mg Oral TID  . feeding supplement (GLUCERNA SHAKE)  237 mL Oral TID BM  . hydrALAZINE  12.5 mg Oral Q8H  . insulin aspart  0-9 Units Subcutaneous Q4H  . mouth rinse  15 mL Mouth  Rinse BID  . pantoprazole  40 mg Oral BID  . sodium chloride flush  10-40 mL Intracatheter Q12H  . sodium chloride flush  3 mL Intravenous Q12H  . warfarin  5 mg Oral q1800  . Warfarin - Physician Dosing Inpatient   Does not apply [id number]   Elmarie Shiley, MD 03/07/2018, 7:40 AM

## 2018-03-07 NOTE — Progress Notes (Addendum)
Advanced Heart Failure Rounding Note  PCP-Cardiologist: No primary care provider on file.   Subjective:    CVVHD stopped 03/05/18. Last CT pulled.   Coox 82.1% on Milrinone 0.125 mcg/kg/min. CVP 9-10  Feeling OK today. IHD ongoing. Denies SOB, dizziness or lightheadedness. Edema improved.   Hgb 9.2. No overt bleeding. Belly feels good. No N/v. Having BMs.   MAPs 80-90s (Hydralazine held yesterday)  CXR this am with low volume/atelectasis. Personally reviewed.   LVAD Interrogation HM 3: Speed: 5300 Flow: 3.6 PI: 5.1 Power: 4.0. VAD interrogated personally. Parameters stable.    Objective:   Weight Range: 190 lb 11.2 oz (86.5 kg) Body mass index is 29.87 kg/m.   Vital Signs:   Temp:  [98 F (36.7 C)-99.6 F (37.6 C)] 99.6 F (37.6 C) (07/03 0446) Pulse Rate:  [47-119] 113 (07/03 0700) Resp:  [0-29] 21 (07/03 0700) BP: (83-117)/(47-92) 117/82 (07/03 0700) SpO2:  [96 %-100 %] 98 % (07/03 0700) Weight:  [190 lb 11.2 oz (86.5 kg)] 190 lb 11.2 oz (86.5 kg) (07/03 0500) Last BM Date: 03/06/18  Weight change: Filed Weights   03/05/18 0600 03/06/18 0600 03/07/18 0500  Weight: 185 lb 13.6 oz (84.3 kg) 184 lb 4.9 oz (83.6 kg) 190 lb 11.2 oz (86.5 kg)   Intake/Output:   Intake/Output Summary (Last 24 hours) at 03/07/2018 0739 Last data filed at 03/07/2018 0700 Gross per 24 hour  Intake 573.19 ml  Output 1 ml  Net 572.19 ml    Physical Exam   General: NAD.  HEENT: Normal. Anicteric Neck: Supple, JVP 7-8 cm. Carotids OK. RIJ TLC. LIJ  trialysis  Cardiac:  Mechanical heart sounds with LVAD hum present.  Lungs:  CTAB, normal effort. No wheeze Abdomen:  NT, ND, no HSM. No bruits or masses. +BS  LVAD exit site: Dressing dry and intact. No erythema or drainage. Stabilization device present and accurately applied. Driveline dressing changed daily per sterile technique. Extremities:  Warm and dry. No cyanosis, clubbing, rash, trace edema. RUE AVF Neuro: alert & oriented x 3,  cranial nerves grossly intact. moves all 4 extremities w/o difficulty. Affect pleasant     Telemetry   Sinus tach 100-110s, personally reviewed.   Labs    CBC Recent Labs    03/06/18 0349 03/07/18 0400  WBC 9.0 11.5*  NEUTROABS 6.5 8.1*  HGB 9.1* 9.2*  HCT 28.2* 28.7*  MCV 90.7 91.1  PLT 123* 244   Basic Metabolic Panel Recent Labs    03/06/18 0349 03/06/18 1632 03/07/18 0400 03/07/18 0401  NA 135 137  --  132*  K 3.9 4.1  --  4.2  CL 102 103  --  100  CO2 26 25  --  23  GLUCOSE 124* 142*  --  119*  BUN 12 15  --  19  CREATININE 3.29* 4.07*  --  4.80*  CALCIUM 7.7* 7.7*  --  7.6*  MG 2.5*  --  2.2  --   PHOS 3.4 3.1  --  3.3   Liver Function Tests Recent Labs    03/05/18 0456  03/06/18 1632 03/07/18 0401  AST 27  --   --   --   ALT 13  --   --   --   ALKPHOS 83  --   --   --   BILITOT 1.0  --   --   --   PROT 5.3*  --   --   --   ALBUMIN 2.9*  2.8*   < >  2.5* 2.5*   < > = values in this interval not displayed.   No results for input(s): LIPASE, AMYLASE in the last 72 hours. Cardiac Enzymes No results for input(s): CKTOTAL, CKMB, CKMBINDEX, TROPONINI in the last 72 hours.  BNP: BNP (last 3 results) Recent Labs    06/26/17 0004 07/03/17 0037 03/02/18 0400  BNP 1,409.3* 758.9* 694.1*    ProBNP (last 3 results) No results for input(s): PROBNP in the last 8760 hours.   D-Dimer No results for input(s): DDIMER in the last 72 hours. Hemoglobin A1C No results for input(s): HGBA1C in the last 72 hours. Fasting Lipid Panel No results for input(s): CHOL, HDL, LDLCALC, TRIG, CHOLHDL, LDLDIRECT in the last 72 hours. Thyroid Function Tests No results for input(s): TSH, T4TOTAL, T3FREE, THYROIDAB in the last 72 hours.  Invalid input(s): FREET3  Other results:   Imaging    Dg Chest 2 View  Result Date: 03/07/2018 CLINICAL DATA:  Left ventricular assist device EXAM: CHEST - 2 VIEW COMPARISON:  Yesterday FINDINGS: LVAD present pacer. Dual-chamber  leads from the left ICD/. Bilateral IJ catheters in stable position. Cardiomegaly with coronary stenting. Low volume chest with mild streaky opacity. No edema, effusion, or pneumothorax. IMPRESSION: Low volume chest with atelectasis. Electronically Signed   By: Monte Fantasia M.D.   On: 03/07/2018 07:26     Medications:     Scheduled Medications: . aspirin EC  81 mg Oral Daily  . atorvastatin  40 mg Oral q1800  . bisacodyl  10 mg Oral Daily   Or  . bisacodyl  10 mg Rectal Daily  . Chlorhexidine Gluconate Cloth  6 each Topical Daily  . citalopram  20 mg Oral Daily  . clonazePAM  1 mg Oral QHS  . docusate sodium  200 mg Oral Daily  . erythromycin  250 mg Oral TID  . feeding supplement (GLUCERNA SHAKE)  237 mL Oral TID BM  . hydrALAZINE  12.5 mg Oral Q8H  . insulin aspart  0-9 Units Subcutaneous Q4H  . mouth rinse  15 mL Mouth Rinse BID  . pantoprazole  40 mg Oral BID  . sodium chloride flush  10-40 mL Intracatheter Q12H  . sodium chloride flush  3 mL Intravenous Q12H  . warfarin  5 mg Oral q1800  . Warfarin - Physician Dosing Inpatient   Does not apply q1800    Infusions: . sodium chloride    . sodium chloride Stopped (03/04/18 2330)  . lactated ringers Stopped (03/01/18 1659)  . lactated ringers    . magnesium sulfate    . milrinone 0.125 mcg/kg/min (03/07/18 0700)  . phenylephrine (NEO-SYNEPHRINE) Adult infusion Stopped (03/03/18 2300)    PRN Medications: alteplase, calcium chloride, LORazepam, morphine injection, ondansetron (ZOFRAN) IV, oxyCODONE, sodium chloride flush, sodium chloride flush, traMADol    Patient Profile   Mr Melucci is a 52 year old with a history of CAD and ischemic cardiomyopathy now s/p Heartmate 3 LVAD on 10/18. Post VAD course complicated by ESRD and severe DM gastroparesis. Admitted with low/no-flow alarms due to LVAD outflow graft kink (does not have bend clip).   Assessment/Plan   1. Cardiogenic shock due to LVAD complication with LVAD  outflow graft thrombosis - s/p Emergent LVAD pump exchange 6/27 - Coox 82.1% on milrinone 0.125 mcg/kg/min. Will likely wean further after initial IHD session.  - CVP 8-9. - CVVHD stopped 03/05/18. IHD on-going currently.  - Tachycardia (sinus tach) somewhat improved. Avoid b-blocker currently if possible  2. VAD - VAD  interrogated personally. Parameters stable.   - LDH 207 - INR 1.68. Coumadin held last night. Discussed dosing with PharmD personally.   3. Intractable Nausea/Vomiting secondary to gastroparesis - Has now had a BM and able to drink glucerna.  - Continue reglan, phenergan, and ativan as needed - Continue erythromycin 250 mg TID a day for gastroparesis  He has been seen by Dr Derrill Kay on 01/24/18 [facility] Gastropares[facility] Specialist) - No change to current plan.    4. ESRD - CVVHD stopped 03/05/18.  - Plan for IHD today.  - No hydralazine on HD days.   5. DM,insulin dependent - SSI for now.   6. Acute respiratory failure - Extubated 6/28  Stable on [place].  - CXR this am with low volume. Encouraged IS and flutter valve.  - Up to chair and mobilize. As tolerated.   I reviewed the LVAD parameters from today, and compared the results to the patient's prior recorded data.  No programming changes were made.  The LVAD is functioning within specified parameters.  The patient performs LVAD self-test daily.  LVAD interrogation was negative for any significant power changes, alarms or PI events/speed drops.  LVAD equipment check completed and is in good working order.  Back-up equipment present.   LVAD education done on emergency procedures and precautions and reviewed exit site care.   Length of Stay: [address]  Annamaria Helling  03/07/2018, 7:39 AM  Advanced Heart Failure Team Pager [phone] (M-F; 7a - 4p)  Please contact [facility] for night-coverage after hours (4p -7a ) and weekends on [email or web address]   Patient seen and examined with the above-signed Advanced Practice Provider  and/or Housestaff. I personally reviewed laboratory data, imaging studies and relevant notes. I independently examined the patient and formulated the important aspects of the plan. I have edited the note to reflect any of my changes or salient points. I have personally discussed the plan with the patient and/or family.  Remains on low-dose milrinone. Tolerating iHD this am. Co-ox 82%. N/V improved. CTs out. INR 1.7. Discussed dosing with PharmD personally. No bleeding. VAD interrogated personally. Parameters stable. After iHD complete will pull LIJ trialysis and stop milrinone. Possible to [place] today or tomorrow.   [name], MD  10:14 AM

## 2018-03-07 NOTE — Progress Notes (Signed)
CSW visited bedside although patient sleeping at the time of visit. No family present. CSW will continue to follow for supportive intervention throughout implant hospitalization.Raquel Sarna, [place], [place]

## 2018-03-07 NOTE — Progress Notes (Signed)
Occupational Therapy Treatment Patient Details Name: [name] MRN: [id number] DOB: 04/15/1966 Today's Date: 03/07/2018    History of present illness Pt is a 52 y.o. male with h/o LVAD placement, admitted to ED due to low flow alarm. CT of chest showed twisted outlfow graft with possible thrombus within the graft.  He was taken emergently to OR and underwent VAD exchange. Pt requiring CRRT during hospitalization, changed to HD on 03/07/18.   PMH includes ESRD on HD, DM, ischemic cardiomyopathy, HTN, gastroparesis, DVT, Chron's disease, AICD.   OT comments  Pt progressing well. Performed UB dressing including vest and power exchange with min assist. Ambulated entire unit with rollator. Remained up in chair at end of session. He needs verbal cues to adhere to sternal precautions. HR to 121 with ambulation. Pt with supportive children at home. Updated d/c recommendations to home.  Follow Up Recommendations  No OT follow up    Equipment Recommendations  None recommended by OT    Recommendations for Other Services      Precautions / Restrictions Precautions Precautions: Sternal Precaution Comments: LVAD Restrictions Weight Bearing Restrictions: Yes Other Position/Activity Restrictions: LVAD precautions       Mobility Bed Mobility Overal bed mobility: Needs Assistance Bed Mobility: Supine to Sit     Supine to sit: Min assist;HOB elevated     General bed mobility comments: min assist to elevate trunk  Transfers Overall transfer level: Needs assistance Equipment used: 4-wheeled walker Transfers: Sit to/from Stand Sit to Stand: Min assist         General transfer comment: light min assist, not likely required, but pt requesting, cues for hands on knees    Balance Overall balance assessment: Needs assistance   Sitting balance-[name]: Good     Standing balance support: Bilateral upper extremity supported Standing balance-[name]: Poor Standing balance  comment: requires light UE support on rollator                           ADL either performed or assessed with clinical judgement   ADL Overall ADL's : Needs assistance/impaired                 Upper Body Dressing : Minimal assistance;Sitting Upper Body Dressing Details (indicate cue type and reason): front opening gown and LVAD vest Lower Body Dressing: Set up;Sitting/lateral leans               Functional mobility during ADLs: Supervision/safety;Rolling walker(rollator) General ADL Comments: Pt able to change power sources independently.      Vision       Perception     Praxis      Cognition Arousal/Alertness: Awake/alert Behavior During Therapy: WFL for tasks assessed/performed Overall Cognitive Status: Within Functional Limits for tasks assessed                                 General Comments: cues for sternal precautions with sit<>stand        Exercises     Shoulder Instructions       General Comments      Pertinent Vitals/ Pain       Pain Assessment: No/denies pain  Home Living  Prior Functioning/Environment              Frequency  Min 2X/week        Progress Toward Goals  OT Goals(current goals can now be found in the care plan section)  Progress towards OT goals: Progressing toward goals  Acute Rehab OT Goals Patient Stated Goal: to be alive for his 50 month old grandson OT Goal Formulation: With patient Time For Goal Achievement: 03/17/18 Potential to Achieve Goals: Good  Plan Discharge plan needs to be updated    Co-evaluation    PT/OT/SLP Co-Evaluation/Treatment: Yes Reason for Co-Treatment: For patient/therapist safety PT goals addressed during session: Mobility/safety with mobility;Balance;Proper use of DME OT goals addressed during session: Strengthening/ROM      AM-PAC PT "6 Clicks" Daily Activity     Outcome Measure    Help from another person eating meals?: None Help from another person taking care of personal grooming?: A Little Help from another person toileting, which includes using toliet, bedpan, or urinal?: A Little Help from another person bathing (including washing, rinsing, drying)?: A Little Help from another person to put on and taking off regular upper body clothing?: A Little Help from another person to put on and taking off regular lower body clothing?: A Little 6 Click Score: 19    End of Session Equipment Utilized During Treatment: Gait belt;Rolling walker  OT Visit Diagnosis: Unsteadiness on feet (R26.81);Muscle weakness (generalized) (M62.81)   Activity Tolerance Patient tolerated treatment well   Patient Left in chair;with call bell/phone within reach   Nurse Communication Mobility status        Time: 4720-7218 OT Time Calculation (min): 49 min  Charges: OT General Charges $OT Visit: 1 Visit OT Treatments $Self Care/Home Management : 8-22 mins $Therapeutic Activity: 8-22 mins  03/07/2018 [name], OTR/L Pager: [phone]   [name] [name] 03/07/2018, 3:36 PM

## 2018-03-07 NOTE — Procedures (Signed)
Patient seen on Hemodialysis. QB 400, UF goal 2L Treatment adjusted as needed.  [name] Shiley MD [facility]. Office # [phone] Pager # [phone] 7:56 AM

## 2018-03-07 NOTE — Progress Notes (Signed)
ANTICOAGULATION CONSULT NOTE - Initial Consult  Pharmacy Consult for warfarin Indication: LVAD  Allergies  Allergen Reactions  . Metformin And Related Diarrhea    Patient Measurements: Height: 5' 7"  (170.2 cm) Weight: 189 lb 13.1 oz (86.1 kg)(Bed Scale) IBW/kg (Calculated) : 66.1  Vital Signs: Temp: 98.2 F (36.8 C) (07/03 1202) Temp Source: Oral (07/03 1202) BP: 105/92 (07/03 1200) Pulse Rate: 106 (07/03 1200)  Labs: Recent Labs    03/05/18 0456  03/06/18 0349 03/06/18 1632 03/07/18 0400 03/07/18 0401  HGB 9.4*  --  9.1*  --  9.2*  --   HCT 28.9*  --  28.2*  --  28.7*  --   PLT 109*  --  123*  --  159  --   LABPROT 25.7*  --  22.1*  --  19.6*  --   INR 2.37  --  1.95  --  1.68  --   CREATININE 2.08*  2.06*   < > 3.29* 4.07*  --  4.80*   < > = values in this interval not displayed.    Estimated Creatinine Clearance: 18.7 mL/min (A) (by C-G formula based on SCr of 4.8 mg/dL (H)).   Medical History: Past Medical History:  Diagnosis Date  . Angina   . ASCVD (arteriosclerotic cardiovascular disease)    , Anterior infarction 2005, LAD diagonal bifurcation intervention 03/2004  . Automatic implantable cardiac defibrillator -St. Jude's       . Benign neoplasm of colon   . CHF (congestive heart failure) ([place])   . Chronic systolic heart failure ([place])   . Coronary artery disease     Widely patent previously placed stents in the left anterior   . Crohn's disease ([place])   . 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 Dialysis patient ([place])   . Dyspnea   . Gastroparesis   . GERD (gastroesophageal reflux disease)   . High cholesterol   . Hyperlipidemia   . Hypersomnolent    Previous diagnosis of narcolepsy  . Hypertension, essential   . Ischemic cardiomyopathy    Ejection fraction 15-20% catheterization 2010  . Type II or unspecified type diabetes mellitus without mention of complication, not stated as uncontrolled   . Unspecified gastritis and  gastroduodenitis without mention of hemorrhage     Medications:  Scheduled:  . aspirin EC  81 mg Oral Daily  . atorvastatin  40 mg Oral q1800  . bisacodyl  10 mg Oral Daily   Or  . bisacodyl  10 mg Rectal Daily  . Chlorhexidine Gluconate Cloth  6 each Topical Daily  . citalopram  20 mg Oral Daily  . clonazePAM  1 mg Oral QHS  . docusate sodium  200 mg Oral Daily  . erythromycin  250 mg Oral TID  . feeding supplement (GLUCERNA SHAKE)  237 mL Oral TID BM  . [START ON 03/08/2018] hydrALAZINE  12.5 mg Oral 3 times per day on Sun Tue Thu Sat  . insulin aspart  0-9 Units Subcutaneous Q4H  . mouth rinse  15 mL Mouth Rinse BID  . pantoprazole  40 mg Oral BID  . sodium chloride flush  10-40 mL Intracatheter Q12H  . sodium chloride flush  3 mL Intravenous Q12H  . warfarin  5 mg Oral q1800  . Warfarin - Physician Dosing Inpatient   Does not apply q1800    Assessment: 61 yom on warfarin PTA for LVAD/factor V leiden who is now s/p LVAD change out for pump  thrombus. Previous warfarin regimen for goal 2-3 is 5 mg daily except 2.5 mg on Friday.    INR on admission was 1.24. Warfarin was restarted at 4 mg on 6/28 and INR increased peaking at 4.13. Warfarin was restarted on 7/1 when INR 2.37.   Today INR is 1.68, likely related to warfarin being held previously for 2 doses. Hgb is 9.2, plt 159, LDH stable at 207. No s/sx of bleeding. Transitioned back from CVVH to HD today. Restarted on erythromycin, which can impact INR sensitivity, for gastroparesis.     Goal of Therapy:  INR 2-3 Monitor platelets by anticoagulation protocol: Yes   Plan:  Will resume home dose of 5 mg tonight Monitor daily INR, CBC, and for s/sx of bleeding  Doylene Canard, PharmD Clinical Pharmacist  Pager: [phone] Phone: [phone] 03/07/2018,1:52 PM

## 2018-03-07 NOTE — Progress Notes (Signed)
LVAD Coordinator Rounding Note:  Admitted 02/28/18 from the ED for 0 Flow from ED. Pt was taken emergently to the OR for outflow graft twist/obstruction.  HM III LVAD implanted on 06/12/17 by Dr. Prescott Gum under Destination Therapy criteria due to renal insufficiency. Pump exchange on 03/01/18 for outflow graft twist/obstruction.   Pt awake, getting dialysis this am. Pt denies complaints.  Vital signs: Temp: 98.5 HR:  104 Doppler Pressure:   Auto BP:   O2 Sat: 100% on RA Wt: 176>166>193>180>185>184>190>189 lbs  LVAD interrogation reveals:  Speed: 5300 Flow: 3.4 Power: 4.1w PI: 6.3 Alarms: none  Events:  3 PI events Hematocrit: 28 Fixed speed: 5200 Low speed limit: 4900  Drive Line: Gauze dressing dry and intact; anchor intact and accurately applied. Existing VAD dressing removed and site care performed using sterile technique. Drive line exit site cleaned with Chlora prep applicators x 2, allowed to dry, and gauzedressing with silver stripre-applied. Exit site healingand unincorporated, the velour is fully implanted at exit site.2 stitches intact. Brown serous drainage with no redness, tenderness, foul odor or rash noted.Drive line anchors intactx 2.  Removed iodoform from old left abdominal driveline site and repacked with 2" iodoform using sterile technique. Covered with a 4 x 4 and tape.  Labs:  LDH trend: 384>253>241>304>217>207  INR trend: 1.70>3.07>4.13>2.37>1.95>1.68  Blood Products: Intra-op: 03/01/18 - 5 PRBCs     2 PLT     2 FFP     2 Cryo  Gtts: Milrinone 0.125 mcg/kg/min Levo 12 mcg/min (stopped 03/04/18) Neo 25 mcg/min (stopped 03/03/18) Epi 1 mcg/min (stopped 03/03/18)  Anticoagulation Plan: -INR Goal: 2.0 - 3.0 -ASA Dose: 81 mg daily   Device: -St Jude dual  -Therapies: currently off - Monitor: on VT 150 bpm  Adverse Events on VAD: - ESRD post LVAD; started CVVH 06/15/17; HD started 06/20/17 - Hospitalization 11/5 - 07/16/17 > gastroparesis  diagnosis after EGD - Hospitalization 11/26 - 08/04/17 > gastroparesis - Hospitalization 1/8 - 09/16/17>gastroparesis - referred to Dr. Corliss Parish at [facility] - 10/31/17>rightupper arm arteriovenous graft with Artegraft; ligation of right first stage brachial vein transposition  - 12/11/13 elective admission for thrombectomy for AV graft in RUA  - 12/20/17 > intractable N/V - 01/19/18 > intractable N/V sent by EMS from dialysis center - 03/01/18 > outflow graft twist/occulsion requiring pump exchange   Plan/Recommendations:  1. Dressing change with old DL site packed daily by VAD Coordinator, Nurse Davonna Belling, or trained caregiver.  2. Please page VAD coordinator with any equipment questions or concerns.  Zada Girt RN, VAD Coordinator 24/7 VAD Pager: [phone]

## 2018-03-07 NOTE — Plan of Care (Signed)
  Problem: Education: Goal: Knowledge of General Education information will improve Outcome: Progressing   Problem: Clinical Measurements: Goal: Ability to maintain clinical measurements within normal limits will improve Outcome: Progressing   Problem: Clinical Measurements: Goal: Respiratory complications will improve Outcome: Progressing   Problem: Clinical Measurements: Goal: Cardiovascular complication will be avoided Outcome: Progressing   Problem: Activity: Goal: Risk for activity intolerance will decrease Outcome: Progressing   Problem: Nutrition: Goal: Adequate nutrition will be maintained Outcome: Progressing   Problem: Elimination: Goal: Will not experience complications related to bowel motility Outcome: Progressing   Problem: Pain Managment: Goal: General experience of comfort will improve Outcome: Progressing   Problem: Safety: Goal: Ability to remain free from injury will improve Outcome: Progressing   Problem: Skin Integrity: Goal: Risk for impaired skin integrity will decrease Outcome: Progressing

## 2018-03-08 LAB — CBC WITH DIFFERENTIAL/PLATELET
ABS IMMATURE GRANULOCYTES: 0.1 10*3/uL (ref 0.0–0.1)
BASOS ABS: 0 10*3/uL (ref 0.0–0.1)
Basophils Relative: 0 %
Eosinophils Absolute: 0.7 10*3/uL (ref 0.0–0.7)
Eosinophils Relative: 7 %
HCT: 27.7 % — ABNORMAL LOW (ref 39.0–52.0)
Hemoglobin: 8.9 g/dL — ABNORMAL LOW (ref 13.0–17.0)
IMMATURE GRANULOCYTES: 1 %
LYMPHS PCT: 13 %
Lymphs Abs: 1.3 10*3/uL (ref 0.7–4.0)
MCH: 29.5 pg (ref 26.0–34.0)
MCHC: 32.1 g/dL (ref 30.0–36.0)
MCV: 91.7 fL (ref 78.0–100.0)
MONO ABS: 1.2 10*3/uL — AB (ref 0.1–1.0)
Monocytes Relative: 12 %
NEUTROS ABS: 6.7 10*3/uL (ref 1.7–7.7)
NEUTROS PCT: 67 %
Platelets: 204 10*3/uL (ref 150–400)
RBC: 3.02 MIL/uL — AB (ref 4.22–5.81)
RDW: 16.4 % — ABNORMAL HIGH (ref 11.5–15.5)
WBC: 10 10*3/uL (ref 4.0–10.5)

## 2018-03-08 LAB — PROTIME-INR
INR: 1.54
PROTHROMBIN TIME: 18.4 s — AB (ref 11.4–15.2)

## 2018-03-08 LAB — GLUCOSE, CAPILLARY
GLUCOSE-CAPILLARY: 122 mg/dL — AB (ref 70–99)
GLUCOSE-CAPILLARY: 196 mg/dL — AB (ref 70–99)
Glucose-Capillary: 194 mg/dL — ABNORMAL HIGH (ref 70–99)
Glucose-Capillary: 180 mg/dL — ABNORMAL HIGH (ref 70–99)

## 2018-03-08 LAB — RENAL FUNCTION PANEL
Albumin: 2.4 g/dL — ABNORMAL LOW (ref 3.5–5.0)
Anion gap: 6 (ref 5–15)
BUN: 15 mg/dL (ref 6–20)
CHLORIDE: 101 mmol/L (ref 98–111)
CO2: 27 mmol/L (ref 22–32)
Calcium: 7.6 mg/dL — ABNORMAL LOW (ref 8.9–10.3)
Creatinine, Ser: 3.76 mg/dL — ABNORMAL HIGH (ref 0.61–1.24)
GFR calc Af Amer: 20 mL/min — ABNORMAL LOW (ref >60)
GFR, EST NON AFRICAN AMERICAN: 17 mL/min — AB (ref >60)
Glucose, Bld: 165 mg/dL — ABNORMAL HIGH (ref 70–99)
POTASSIUM: 4.1 mmol/L (ref 3.5–5.1)
Phosphorus: 2.6 mg/dL (ref 2.5–4.6)
Sodium: 134 mmol/L — ABNORMAL LOW (ref 135–145)

## 2018-03-08 LAB — COOXEMETRY PANEL
CARBOXYHEMOGLOBIN: 2 % — AB (ref 0.5–1.5)
Methemoglobin: 1.4 % (ref 0.0–1.5)
O2 Saturation: 73.6 %
Total hemoglobin: 9.3 g/dL — ABNORMAL LOW (ref 12.0–16.0)

## 2018-03-08 LAB — MAGNESIUM: MAGNESIUM: 2 mg/dL (ref 1.7–2.4)

## 2018-03-08 LAB — LACTATE DEHYDROGENASE: LDH: 197 U/L — ABNORMAL HIGH (ref 98–192)

## 2018-03-08 LAB — HEPARIN LEVEL (UNFRACTIONATED): Heparin Unfractionated: <0.1 IU/mL — ABNORMAL LOW (ref 0.30–0.70)

## 2018-03-08 LAB — BRAIN NATRIURETIC PEPTIDE: B Natriuretic Peptide: 1576.6 pg/mL — ABNORMAL HIGH (ref 0.0–100.0)

## 2018-03-08 MED ORDER — HYDRALAZINE HCL 25 MG PO TABS
25.0000 mg | ORAL_TABLET | ORAL | Status: DC
  Administered 2018-03-08: 25 mg via ORAL
  Filled 2018-03-08 (×2): qty 1

## 2018-03-08 MED ORDER — HEPARIN (PORCINE) IN NACL 100-0.45 UNIT/ML-% IJ SOLN
1350.0000 [IU]/h | INTRAMUSCULAR | Status: DC
  Administered 2018-03-08: 1000 [IU]/h via INTRAVENOUS
  Filled 2018-03-08 (×2): qty 250

## 2018-03-08 NOTE — Progress Notes (Signed)
LVAD Coordinator Rounding Note:  Admitted 02/28/18 from the ED for 0 Flow from ED. Pt was taken emergently to the OR for outflow graft twist/obstruction.  HM III LVAD implanted on 06/12/17 by Dr. Prescott Gum under Destination Therapy criteria due to renal insufficiency. Pump exchange on 03/01/18 for outflow graft twist/obstruction.   Pt awake, sitting up in chair. Back to bed for dressing change. , Nurse reports pt has been hypertensive with MAP 100 - 110. This was after his am dose of 12.5 mg hydralazine. Pre admission, pt had been taking Hydralazine 37.5 mg tid on ND days.   INR 1.54 - not on heparin gtt, will continue to monitor.  Vital signs: Temp: 97.4 HR:  98 Doppler Pressure:  100 Auto BP:  134/98 (110) O2 Sat: 100% on RA Wt: 176>166>193>180>185>184>190>189>187 lbs  LVAD interrogation reveals:  Speed: 5300 Flow: 3.4 Power: 4.1w PI: 6.9 Alarms: none  Events:  none Hematocrit: 28 Fixed speed: 5200 Low speed limit: 4900  Drive Line: Gauze dressing dry and intact; anchor intact and accurately applied. Existing VAD dressing removed and site care performed using sterile technique. Drive line exit site cleaned with Chlora prep applicators x 2, allowed to dry, and gauzedressing with silver stripre-applied. Exit site healingand unincorporated, the velour is fully implanted at exit site.2 stitches intact. Brown serous drainage with no redness, tenderness, foul odor or rash noted.Drive line anchors intactx 2.  Removed iodoform from old left abdominal driveline site and repacked with 2" iodoform using sterile technique. Covered with a 4 x 4 and tape.  Labs:  LDH trend: 384>253>241>304>217>207>197  INR trend: 1.70>3.07>4.13>2.37>1.95>1.68>1.54  Blood Products: Intra-op: 03/01/18 - 5 PRBCs     2 PLT     2 FFP     2 Cryo  Gtts: Milrinone 0.125 mcg/kg/min Levo 12 mcg/min (stopped 03/04/18) Neo 25 mcg/min (stopped 03/03/18) Epi 1 mcg/min (stopped 03/03/18)  Anticoagulation  Plan: -INR Goal: 2.0 - 3.0 -ASA Dose: 81 mg daily   Device: -St Jude dual  -Therapies: currently off - Monitor: on VT 150 bpm  Adverse Events on VAD: - ESRD post LVAD; started CVVH 06/15/17; HD started 06/20/17 - Hospitalization 11/5 - 07/16/17 > gastroparesis diagnosis after EGD - Hospitalization 11/26 - 08/04/17 > gastroparesis - Hospitalization 1/8 - 09/16/17>gastroparesis - referred to Dr. Corliss Parish at [facility] - 10/31/17>rightupper arm arteriovenous graft with Artegraft; ligation of right first stage brachial vein transposition  - 12/11/13 elective admission for thrombectomy for AV graft in RUA  - 12/20/17 > intractable N/V - 01/19/18 > intractable N/V sent by EMS from dialysis center - 03/01/18 > outflow graft twist/occulsion requiring pump exchange   Plan/Recommendations:  1. Dressing change with old DL site packed daily by VAD Coordinator, Nurse Davonna Belling, or trained caregiver.  2. Please page VAD coordinator with any equipment questions or concerns.  Zada Girt RN, VAD Coordinator 24/7 VAD Pager: [phone]

## 2018-03-08 NOTE — Progress Notes (Signed)
Patient ID: [name], male   DOB: Jul 26, 1966, 52 y.o.   MRN: [id number] TCTS Evening Rounds:  Hemodynamics stable with MAP in 90's to 100 today. Running higher on off-dialysis days and getting hydralazine to compensate.  Feels well, eating some.  VAD parameters stable  Awaiting bed on 2C.

## 2018-03-08 NOTE — Progress Notes (Signed)
ANTICOAGULATION CONSULT NOTE - Follow Up  Consult  Pharmacy Consult for warfarin Indication: LVAD  Allergies  Allergen Reactions  . Metformin And Related Diarrhea    Patient Measurements: Height: 5' 7"  (170.2 cm) Weight: 187 lb (84.8 kg) IBW/kg (Calculated) : 66.1  Vital Signs: Temp: 98.3 F (36.8 C) (07/04 1156) Temp Source: Oral (07/04 1156) BP: 107/90 (07/04 1200) Pulse Rate: 97 (07/04 1200)  Labs: Recent Labs    03/06/18 0349  03/07/18 0400 03/07/18 0401 03/07/18 1600 03/08/18 0506  HGB 9.1*  --  9.2*  --   --  8.9*  HCT 28.2*  --  28.7*  --   --  27.7*  PLT 123*  --  159  --   --  204  LABPROT 22.1*  --  19.6*  --   --  18.4*  INR 1.95  --  1.68  --   --  1.54  CREATININE 3.29*   < >  --  4.80* 2.57* 3.76*   < > = values in this interval not displayed.    Estimated Creatinine Clearance: 23.7 mL/min (A) (by C-G formula based on SCr of 3.76 mg/dL (H)).   Medical History: Past Medical History:  Diagnosis Date  . Angina   . ASCVD (arteriosclerotic cardiovascular disease)    , Anterior infarction 2005, LAD diagonal bifurcation intervention 03/2004  . Automatic implantable cardiac defibrillator -St. Jude's       . Benign neoplasm of colon   . CHF (congestive heart failure) ([place])   . Chronic systolic heart failure ([place])   . Coronary artery disease     Widely patent previously placed stents in the left anterior   . Crohn's disease ([place])   . 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 Dialysis patient ([place])   . Dyspnea   . Gastroparesis   . GERD (gastroesophageal reflux disease)   . High cholesterol   . Hyperlipidemia   . Hypersomnolent    Previous diagnosis of narcolepsy  . Hypertension, essential   . Ischemic cardiomyopathy    Ejection fraction 15-20% catheterization 2010  . Type II or unspecified type diabetes mellitus without mention of complication, not stated as uncontrolled   . Unspecified gastritis and gastroduodenitis without  mention of hemorrhage     Medications:  Scheduled:  . aspirin EC  81 mg Oral Daily  . atorvastatin  40 mg Oral q1800  . bisacodyl  10 mg Oral Daily   Or  . bisacodyl  10 mg Rectal Daily  . Chlorhexidine Gluconate Cloth  6 each Topical Daily  . citalopram  20 mg Oral Daily  . clonazePAM  1 mg Oral QHS  . docusate sodium  200 mg Oral Daily  . erythromycin  250 mg Oral TID  . feeding supplement (GLUCERNA SHAKE)  237 mL Oral TID BM  . hydrALAZINE  25 mg Oral 3 times per day on Sun Tue Thu Sat  . insulin aspart  0-5 Units Subcutaneous QHS  . insulin aspart  0-9 Units Subcutaneous TID WC  . mouth rinse  15 mL Mouth Rinse BID  . pantoprazole  40 mg Oral BID  . sodium chloride flush  10-40 mL Intracatheter Q12H  . sodium chloride flush  3 mL Intravenous Q12H  . warfarin  5 mg Oral q1800  . Warfarin - Pharmacist Dosing Inpatient   Does not apply q1800    Assessment: 79 yom on warfarin PTA for LVAD/factor V leiden who is now s/p LVAD  change out for pump thrombus. Previous warfarin regimen for goal 2-3 is 5 mg daily except 2.5 mg on Friday.    INR on admission was 1.24. Warfarin was restarted at 4 mg on 6/28 and INR increased peaking at 4.13. Warfarin was restarted on 7/1 when INR 2.37.   Today INR fell 1.5, likely related to warfarin being held previously for 2 doses and patient eating more. Hgb is stable, plt stable  LDH stable at 200s. No s/sx of bleeding. Transitioned back from CVVH to HD. Restarted on erythromycin, which can impact INR sensitivity, for gastroparesis.     Goal of Therapy:  HL 0.3-0.5 INR 2-3 Monitor platelets by anticoagulation protocol: Yes   Plan:  Warfarin 27m daily  - may need a boost will watch trend Heparin drip 1000 uts/hr  HL in 6hr  Monitor daily INR, CBC, and for s/sx of bleeding   LBonnita NasutiPharm.D. CPP, BCPS Clinical Pharmacist [phone]/12/2017 2:25 PM

## 2018-03-08 NOTE — Progress Notes (Signed)
Patient ID: [name], male   DOB: 09/03/1966, 52 y.o.   MRN: [id number]  [facility] Progress Note   Assessment/ Plan:   1.  Status post left ventricular assist device pump thrombosis: Status post LVAD pump removal/exchange with redo sternotomy on 02/28/2018.  Cardiothoracic surgery note reviewed-doing well from surgical standpoint with ongoing care of the driveline tunnel.  On Coumadin with INR 1.5. 2. End-stage renal disease: Tolerated hemodialysis yesterday without problems-remains off of pressors/milrinone.  Will order his next hemodialysis treatment tomorrow we will cautiously trying to lower his dry weight without causing symptomatic hypotension. 3. 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 5. Nutrition: Continue renal diet, renal multivitamin and supplementation with Glucerna.  Albumin 2.9 6.  Hyponatremia: Secondary to free water intake in patient with ESRD-improved with ultrafiltration/dialysis.  Subjective:   Tolerated hemodialysis well yesterday without problems-temporary left IJ hemodialysis catheter discontinued.   Objective:   BP (!) 120/101   Pulse 94   Temp 99 F (37.2 C) (Oral)   Resp 18   Ht 5' 7"  (1.702 m)   Wt 84.8 kg (187 lb)   SpO2 98%   BMI 29.29 kg/m   Physical Exam: Gen: Soundly sleeping in recliner-difficult to wake up CVS: Pulse regular rhythm and normal rate, LVAD hum audible over precordium Resp: Anteriorly clear to auscultation, no rales/rhonchi Abd: Soft, flat, nontender Ext: Trace lower extremity edema, RUA AVG with soft hum consistent with baseline  Labs: BMET Recent Labs  Lab 03/05/18 0456 03/05/18 2048 03/06/18 0349 03/06/18 1632 03/07/18 0401 03/07/18 1600 03/08/18 0506  NA 135  137 137 135 137 132* 139 134*  K 4.7  3.9 4.2 3.9 4.1 4.2 4.0 4.1  CL 100  102 102 102 103 100 100 101  CO2 25  27 25 26 25 23 30  27   GLUCOSE 118*  117* 143* 124* 142* 119* 175* 165*  BUN 6  6 8 12 15 19 8 15   CREATININE 2.08*  2.06* 2.76* 3.29* 4.07* 4.80* 2.57* 3.76*  CALCIUM 8.0*  8.1* 8.2* 7.7* 7.7* 7.6* 7.7* 7.6*  PHOS 1.2* 3.6 3.4 3.1 3.3 2.0* 2.6   CBC Recent Labs  Lab 03/05/18 0456 03/06/18 0349 03/07/18 0400 03/08/18 0506  WBC 10.6* 9.0 11.5* 10.0  NEUTROABS 8.7* 6.5 8.1* 6.7  HGB 9.4* 9.1* 9.2* 8.9*  HCT 28.9* 28.2* 28.7* 27.7*  MCV 90.3 90.7 91.1 91.7  PLT 109* 123* 159 204    Medications:    . aspirin EC  81 mg Oral Daily  . atorvastatin  40 mg Oral q1800  . bisacodyl  10 mg Oral Daily   Or  . bisacodyl  10 mg Rectal Daily  . Chlorhexidine Gluconate Cloth  6 each Topical Daily  . citalopram  20 mg Oral Daily  . clonazePAM  1 mg Oral QHS  . docusate sodium  200 mg Oral Daily  . erythromycin  250 mg Oral TID  . feeding supplement (GLUCERNA SHAKE)  237 mL Oral TID BM  . hydrALAZINE  12.5 mg Oral 3 times per day on Sun Tue Thu Sat  . insulin aspart  0-5 Units Subcutaneous QHS  . insulin aspart  0-9 Units Subcutaneous TID WC  . mouth rinse  15 mL Mouth Rinse BID  . pantoprazole  40 mg Oral BID  . sodium chloride flush  10-40 mL Intracatheter Q12H  . sodium chloride flush  3 mL Intravenous Q12H  .  warfarin  5 mg Oral q1800  . Warfarin - Pharmacist Dosing Inpatient   Does not apply [id number]   Elmarie Shiley, MD 03/08/2018, 7:37 AM

## 2018-03-08 NOTE — Progress Notes (Signed)
Advanced Heart Failure Rounding Note  PCP-Cardiologist: No primary care provider on file.   Subjective:    CVVHD stopped 03/05/18.   Tolerated iHD well yesterday.   Feels good today. MAPs high on non-HD days. Prn Hydralazine given.   Denies sob, orthopnea or PND. Mild pocket pain.   LVAD Interrogation HM 3: Speed: 5300 Flow: 3.4 PI: 6.5  Power: 4.0.VAD interrogated personally. Parameters stable.   Objective:   Weight Range: 84.8 kg (187 lb) Body mass index is 29.29 kg/m.   Vital Signs:   Temp:  [97.4 F (36.3 C)-99 F (37.2 C)] 98.3 F (36.8 C) (07/04 1156) Pulse Rate:  [91-104] 97 (07/04 1200) Resp:  [9-25] 25 (07/04 1200) BP: (102-134)/(89-101) 107/90 (07/04 1200) SpO2:  [95 %-100 %] 100 % (07/04 1200) Weight:  [84.8 kg (187 lb)] 84.8 kg (187 lb) (07/04 0500) Last BM Date: 03/07/18  MAPs 90-110  Weight change: Filed Weights   03/07/18 0715 03/07/18 1153 03/08/18 0500  Weight: 86.5 kg (190 lb 11.2 oz) 86.1 kg (189 lb 13.1 oz) 84.8 kg (187 lb)   Intake/Output:   Intake/Output Summary (Last 24 hours) at 03/08/2018 1357 Last data filed at 03/08/2018 1200 Gross per 24 hour  Intake 593.12 ml  Output 1 ml  Net 592.12 ml    Physical Exam   General:  Lying in bed NAD.  HEENT: normal  Neck: supple. JVP not elevated.  Carotids 2+ bilat; no bruits. No lymphadenopathy or thryomegaly appreciated. Cor: Sternal wound ok LVAD hum.  Lungs: Clear. Abdomen: obese soft, nontender, non-distended. No hepatosplenomegaly. No bruits or masses. Good bowel sounds. Driveline site clean. Anchor in place.  Extremities: no cyanosis, clubbing, rash. Warm no edema  RUE AVF Neuro: alert & oriented x 3. No focal deficits. Moves all 4 without problem     Telemetry   Sinus tach 90-100s, personally reviewed.   Labs    CBC Recent Labs    03/07/18 0400 03/08/18 0506  WBC 11.5* 10.0  NEUTROABS 8.1* 6.7  HGB 9.2* 8.9*  HCT 28.7* 27.7*  MCV 91.1 91.7  PLT 159 349   Basic  Metabolic Panel Recent Labs    03/07/18 0400  03/07/18 1600 03/08/18 0506  NA  --    < > 139 134*  K  --    < > 4.0 4.1  CL  --    < > 100 101  CO2  --    < > 30 27  GLUCOSE  --    < > 175* 165*  BUN  --    < > 8 15  CREATININE  --    < > 2.57* 3.76*  CALCIUM  --    < > 7.7* 7.6*  MG 2.2  --   --  2.0  PHOS  --    < > 2.0* 2.6   < > = values in this interval not displayed.   Liver Function Tests Recent Labs    03/07/18 1600 03/08/18 0506  ALBUMIN 2.5* 2.4*   No results for input(s): LIPASE, AMYLASE in the last 72 hours. Cardiac Enzymes No results for input(s): CKTOTAL, CKMB, CKMBINDEX, TROPONINI in the last 72 hours.  BNP: BNP (last 3 results) Recent Labs    07/03/17 0037 03/02/18 0400 03/08/18 0008  BNP 758.9* 694.1* 1,576.6*    ProBNP (last 3 results) No results for input(s): PROBNP in the last 8760 hours.   D-Dimer No results for input(s): DDIMER in the last 72 hours. Hemoglobin A1C  No results for input(s): HGBA1C in the last 72 hours. Fasting Lipid Panel No results for input(s): CHOL, HDL, LDLCALC, TRIG, CHOLHDL, LDLDIRECT in the last 72 hours. Thyroid Function Tests No results for input(s): TSH, T4TOTAL, T3FREE, THYROIDAB in the last 72 hours.  Invalid input(s): FREET3  Other results:   Imaging    No results found.   Medications:     Scheduled Medications: . aspirin EC  81 mg Oral Daily  . atorvastatin  40 mg Oral q1800  . bisacodyl  10 mg Oral Daily   Or  . bisacodyl  10 mg Rectal Daily  . Chlorhexidine Gluconate Cloth  6 each Topical Daily  . citalopram  20 mg Oral Daily  . clonazePAM  1 mg Oral QHS  . docusate sodium  200 mg Oral Daily  . erythromycin  250 mg Oral TID  . feeding supplement (GLUCERNA SHAKE)  237 mL Oral TID BM  . hydrALAZINE  12.5 mg Oral 3 times per day on Sun Tue Thu Sat  . insulin aspart  0-5 Units Subcutaneous QHS  . insulin aspart  0-9 Units Subcutaneous TID WC  . mouth rinse  15 mL Mouth Rinse BID  .  pantoprazole  40 mg Oral BID  . sodium chloride flush  10-40 mL Intracatheter Q12H  . sodium chloride flush  3 mL Intravenous Q12H  . warfarin  5 mg Oral q1800  . Warfarin - Pharmacist Dosing Inpatient   Does not apply q1800    Infusions: . sodium chloride    . sodium chloride 10 mL/hr at 03/08/18 0800  . lactated ringers Stopped (03/01/18 1659)  . lactated ringers    . magnesium sulfate      PRN Medications: calcium chloride, LORazepam, morphine injection, ondansetron (ZOFRAN) IV, oxyCODONE, sodium chloride flush, sodium chloride flush, traMADol    Patient Profile   [name] is a 52 year old with a history of CAD and ischemic cardiomyopathy now s/p Heartmate 3 LVAD on 10/18. Post VAD course complicated by ESRD and severe DM gastroparesis. Admitted with low/no-flow alarms due to LVAD outflow graft kink (does not have bend clip).   Assessment/Plan   1. Cardiogenic shock due to LVAD complication with LVAD outflow graft thrombosis - s/p Emergent LVAD pump exchange 6/27 - [place] 74% off milrinone now  - Volume status looks good tolerating IHD.  - Tachycardia (sinus tach) somewhat improved. Avoid b-blocker currently if possible  2. VAD - VAD interrogated personally. Parameters stable.   - LDH 197 - INR 1.68-> 1.54. Start heparin. Warfarin dosing discussed dosing with PharmD personally.    3. Intractable Nausea/Vomiting secondary to gastroparesis - Having regular BMs - Continue reglan, phenergan, and ativan as needed - Continue erythromycin 250 mg TID a day for gastroparesis  He has been seen by Dr Derrill Kay on 01/24/18 [facility] Gastropares[facility] Specialist) - No change to current plan.    4. ESRD - CVVHD stopped 03/05/18.  - Tolerating iHD now  - No hydralazine on HD days.   5. DM,insulin dependent - SSI for now.   6. Acute respiratory failure - Extubated 6/28  Stable on [place].  - CXR this 7/3 with low volume. Encouraged IS and flutter valve.  - Up to chair and mobilize. As  tolerated.   7. HTN - MAPs elevated on non-HD days. Will increase hydralazine on those days   Can go to [facility]. D/W Dr. Cyndia Bent  I reviewed the LVAD parameters from today, and compared the results to the patient's prior  recorded data.  No programming changes were made.  The LVAD is functioning within specified parameters.  The patient performs LVAD self-test daily.  LVAD interrogation was negative for any significant power changes, alarms or PI events/speed drops.  LVAD equipment check completed and is in good working order.  Back-up equipment present.   LVAD education done on emergency procedures and precautions and reviewed exit site care.   Length of Stay: Pen Mar, MD  03/08/2018, 1:57 PM  Advanced Heart Failure Team Pager [phone] (M-F; 7a - 4p)  Please contact [facility] for night-coverage after hours (4p -7a ) and weekends on [email or web address]

## 2018-03-08 NOTE — Progress Notes (Signed)
[place] for Heparin  Indication: LVAD  Allergies  Allergen Reactions  . Metformin And Related Diarrhea   Patient Measurements: Height: 5' 7"  (170.2 cm) Weight: 187 lb (84.8 kg) IBW/kg (Calculated) : 66.1  Vital Signs: Temp: 99.3 F (37.4 C) (07/04 1939) Temp Source: Oral (07/04 1939) BP: 112/92 (07/04 2100) Pulse Rate: 97 (07/04 2100)  Labs: Recent Labs    03/06/18 0349  03/07/18 0400 03/07/18 0401 03/07/18 1600 03/08/18 0506 03/08/18 2112  HGB 9.1*  --  9.2*  --   --  8.9*  --   HCT 28.2*  --  28.7*  --   --  27.7*  --   PLT 123*  --  159  --   --  204  --   LABPROT 22.1*  --  19.6*  --   --  18.4*  --   INR 1.95  --  1.68  --   --  1.54  --   HEPARINUNFRC  --   --   --   --   --   --  <0.10*  CREATININE 3.29*   < >  --  4.80* 2.57* 3.76*  --    < > = values in this interval not displayed.    Estimated Creatinine Clearance: 23.7 mL/min (A) (by C-G formula based on SCr of 3.76 mg/dL (H)).   Medical History: Past Medical History:  Diagnosis Date  . Angina   . ASCVD (arteriosclerotic cardiovascular disease)    , Anterior infarction 2005, LAD diagonal bifurcation intervention 03/2004  . Automatic implantable cardiac defibrillator -St. Jude's       . Benign neoplasm of colon   . CHF (congestive heart failure) ([place])   . Chronic systolic heart failure ([place])   . Coronary artery disease     Widely patent previously placed stents in the left anterior   . Crohn's disease ([place])   . 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 Dialysis patient ([place])   . Dyspnea   . Gastroparesis   . GERD (gastroesophageal reflux disease)   . High cholesterol   . Hyperlipidemia   . Hypersomnolent    Previous diagnosis of narcolepsy  . Hypertension, essential   . Ischemic cardiomyopathy    Ejection fraction 15-20% catheterization 2010  . Type II or unspecified type diabetes mellitus without mention of complication, not stated  as uncontrolled   . Unspecified gastritis and gastroduodenitis without mention of hemorrhage     Medications:  Scheduled:  . aspirin EC  81 mg Oral Daily  . atorvastatin  40 mg Oral q1800  . bisacodyl  10 mg Oral Daily   Or  . bisacodyl  10 mg Rectal Daily  . Chlorhexidine Gluconate Cloth  6 each Topical Daily  . citalopram  20 mg Oral Daily  . clonazePAM  1 mg Oral QHS  . docusate sodium  200 mg Oral Daily  . erythromycin  250 mg Oral TID  . feeding supplement (GLUCERNA SHAKE)  237 mL Oral TID BM  . hydrALAZINE  25 mg Oral 3 times per day on Sun Tue Thu Sat  . insulin aspart  0-5 Units Subcutaneous QHS  . insulin aspart  0-9 Units Subcutaneous TID WC  . mouth rinse  15 mL Mouth Rinse BID  . pantoprazole  40 mg Oral BID  . sodium chloride flush  10-40 mL Intracatheter Q12H  . sodium chloride flush  3 mL Intravenous Q12H  .  warfarin  5 mg Oral q1800  . Warfarin - Pharmacist Dosing Inpatient   Does not apply q1800    Assessment: 19 yom on warfarin PTA for LVAD/factor V leiden who is now s/p LVAD change out for pump thrombus. Previous warfarin regimen for goal 2-3 is 5 mg daily except 2.5 mg on Friday.    INR on admission was 1.24. Warfarin was restarted at 4 mg on 6/28 and INR increased peaking at 4.13. Warfarin was restarted on 7/1 when INR 2.37.   Today INR fell 1.5, likely related to warfarin being held previously for 2 doses and patient eating more. Hgb is stable, plt stable  LDH stable at 200s. No s/sx of bleeding. Transitioned back from CVVH to HD. Restarted on erythromycin, which can impact INR sensitivity, for gastroparesis.     7/4 PM update: started on heparin for sub-therapeutic INR, initial heparin level is low  Goal of Therapy:  Heparin level 0.3-0.5 units/mL INR 2-3 Monitor platelets by anticoagulation protocol: Yes   Plan:  Inc heparin drip to 1250 units/hr Heparin level with AM labs  Narda Bonds, PharmD, [place] Pharmacist Phone:  [phone]

## 2018-03-09 DIAGNOSIS — Z95811 Presence of heart assist device: Secondary | ICD-10-CM

## 2018-03-09 DIAGNOSIS — I255 Ischemic cardiomyopathy: Secondary | ICD-10-CM

## 2018-03-09 DIAGNOSIS — I5023 Acute on chronic systolic (congestive) heart failure: Secondary | ICD-10-CM

## 2018-03-09 LAB — BASIC METABOLIC PANEL
Anion gap: 7 (ref 5–15)
BUN: 26 mg/dL — AB (ref 6–20)
CALCIUM: 7.7 mg/dL — AB (ref 8.9–10.3)
CO2: 27 mmol/L (ref 22–32)
Chloride: 103 mmol/L (ref 98–111)
Creatinine, Ser: 5.48 mg/dL — ABNORMAL HIGH (ref 0.61–1.24)
GFR calc non Af Amer: 11 mL/min — ABNORMAL LOW (ref >60)
GFR, EST AFRICAN AMERICAN: 12 mL/min — AB (ref >60)
GLUCOSE: 196 mg/dL — AB (ref 70–99)
Potassium: 3.9 mmol/L (ref 3.5–5.1)
SODIUM: 137 mmol/L (ref 135–145)

## 2018-03-09 LAB — GLUCOSE, CAPILLARY
Glucose-Capillary: 203 mg/dL — ABNORMAL HIGH (ref 70–99)
Glucose-Capillary: 116 mg/dL — ABNORMAL HIGH (ref 70–99)
Glucose-Capillary: 163 mg/dL — ABNORMAL HIGH (ref 70–99)
Glucose-Capillary: 154 mg/dL — ABNORMAL HIGH (ref 70–99)

## 2018-03-09 LAB — CBC WITH DIFFERENTIAL/PLATELET
ABS IMMATURE GRANULOCYTES: 0.2 10*3/uL — AB (ref 0.0–0.1)
BASOS ABS: 0 10*3/uL (ref 0.0–0.1)
Basophils Relative: 0 %
EOS PCT: 6 %
Eosinophils Absolute: 0.7 10*3/uL (ref 0.0–0.7)
HEMATOCRIT: 28.5 % — AB (ref 39.0–52.0)
HEMOGLOBIN: 9.1 g/dL — AB (ref 13.0–17.0)
Immature Granulocytes: 2 %
LYMPHS ABS: 1.6 10*3/uL (ref 0.7–4.0)
LYMPHS PCT: 14 %
MCH: 29.5 pg (ref 26.0–34.0)
MCHC: 31.9 g/dL (ref 30.0–36.0)
MCV: 92.5 fL (ref 78.0–100.0)
MONO ABS: 1 10*3/uL (ref 0.1–1.0)
MONOS PCT: 9 %
NEUTROS ABS: 7.9 10*3/uL — AB (ref 1.7–7.7)
Neutrophils Relative %: 69 %
Platelets: 274 10*3/uL (ref 150–400)
RBC: 3.08 MIL/uL — ABNORMAL LOW (ref 4.22–5.81)
RDW: 16.6 % — ABNORMAL HIGH (ref 11.5–15.5)
WBC: 11.4 10*3/uL — ABNORMAL HIGH (ref 4.0–10.5)

## 2018-03-09 LAB — CBC
HCT: 31.2 % — ABNORMAL LOW (ref 39.0–52.0)
Hemoglobin: 10 g/dL — ABNORMAL LOW (ref 13.0–17.0)
MCH: 29.2 pg (ref 26.0–34.0)
MCHC: 32.1 g/dL (ref 30.0–36.0)
MCV: 91 fL (ref 78.0–100.0)
Platelets: 300 10*3/uL (ref 150–400)
RBC: 3.43 MIL/uL — ABNORMAL LOW (ref 4.22–5.81)
RDW: 15.9 % — ABNORMAL HIGH (ref 11.5–15.5)
WBC: 14.8 10*3/uL — ABNORMAL HIGH (ref 4.0–10.5)

## 2018-03-09 LAB — COOXEMETRY PANEL
CARBOXYHEMOGLOBIN: 1.7 % — AB (ref 0.5–1.5)
METHEMOGLOBIN: 1.3 % (ref 0.0–1.5)
O2 Saturation: 62.1 %
Total hemoglobin: 8.9 g/dL — ABNORMAL LOW (ref 12.0–16.0)

## 2018-03-09 LAB — LACTATE DEHYDROGENASE: LDH: 206 U/L — ABNORMAL HIGH (ref 98–192)

## 2018-03-09 LAB — PROTIME-INR
INR: 1.48
Prothrombin Time: 17.8 seconds — ABNORMAL HIGH (ref 11.4–15.2)

## 2018-03-09 LAB — HEPARIN LEVEL (UNFRACTIONATED): HEPARIN UNFRACTIONATED: 0.23 [IU]/mL — AB (ref 0.30–0.70)

## 2018-03-09 MED ORDER — HYDRALAZINE HCL 25 MG PO TABS
12.5000 mg | ORAL_TABLET | Freq: Once | ORAL | Status: AC
  Administered 2018-03-09: 12.5 mg via ORAL
  Filled 2018-03-09: qty 1

## 2018-03-09 MED ORDER — HYDRALAZINE HCL 50 MG PO TABS
25.0000 mg | ORAL_TABLET | Freq: Three times a day (TID) | ORAL | Status: DC
  Administered 2018-03-10: 25 mg via ORAL
  Filled 2018-03-09 (×3): qty 1

## 2018-03-09 MED ORDER — HYDRALAZINE HCL 20 MG/ML IJ SOLN
10.0000 mg | Freq: Once | INTRAMUSCULAR | Status: AC
  Administered 2018-03-10: 10 mg via INTRAVENOUS
  Filled 2018-03-09: qty 1

## 2018-03-09 MED ORDER — WARFARIN SODIUM 3 MG PO TABS
6.0000 mg | ORAL_TABLET | Freq: Once | ORAL | Status: AC
  Administered 2018-03-09: 6 mg via ORAL
  Filled 2018-03-09: qty 2

## 2018-03-09 MED ORDER — HEPARIN (PORCINE) IN NACL 100-0.45 UNIT/ML-% IJ SOLN
1350.0000 [IU]/h | INTRAMUSCULAR | Status: DC
  Administered 2018-03-09 – 2018-03-11 (×3): 1350 [IU]/h via INTRAVENOUS
  Filled 2018-03-09 (×2): qty 250

## 2018-03-09 MED ORDER — PROMETHAZINE HCL 25 MG/ML IJ SOLN
25.0000 mg | Freq: Three times a day (TID) | INTRAMUSCULAR | Status: DC | PRN
  Administered 2018-03-09 – 2018-03-12 (×8): 25 mg via INTRAVENOUS
  Filled 2018-03-09 (×8): qty 1

## 2018-03-09 MED ORDER — HEPARIN SODIUM (PORCINE) 1000 UNIT/ML DIALYSIS: 40.0000 [IU]/kg | INTRAMUSCULAR | Status: DC | PRN

## 2018-03-09 MED ORDER — WARFARIN SODIUM 7.5 MG PO TABS: 7.5000 mg | ORAL_TABLET | Freq: Once | ORAL | Status: DC

## 2018-03-09 MED ORDER — AMLODIPINE BESYLATE 2.5 MG PO TABS: 2.5000 mg | ORAL_TABLET | Freq: Every day | ORAL | Status: DC

## 2018-03-09 MED ORDER — METOCLOPRAMIDE HCL 5 MG/ML IJ SOLN
10.0000 mg | Freq: Four times a day (QID) | INTRAMUSCULAR | Status: DC
  Administered 2018-03-09 – 2018-03-11 (×8): 10 mg via INTRAVENOUS
  Filled 2018-03-09 (×8): qty 2

## 2018-03-09 NOTE — Progress Notes (Signed)
Patient ID: [name], male   DOB: [birth year], 52 y.o.   MRN: [id number] TCTS Evening Rounds:  Hemodynamically stable after HD today. 2.5L removed.  VAD parameters stable.

## 2018-03-09 NOTE — Progress Notes (Signed)
[place] for Heparin  Indication: LVAD  Allergies  Allergen Reactions  . Metformin And Related Diarrhea   Patient Measurements: Height: 5' 7"  (170.2 cm) Weight: 187 lb (84.8 kg) IBW/kg (Calculated) : 66.1  Vital Signs: Temp: 99.5 F (37.5 C) (07/05 0026) Temp Source: Oral (07/05 0026) BP: 131/102 (07/05 0400) Pulse Rate: 89 (07/05 0400)  Labs: Recent Labs    03/07/18 0400 03/07/18 0401 03/07/18 1600 03/08/18 0506 03/08/18 2112 03/09/18 0346  HGB 9.2*  --   --  8.9*  --  9.1*  HCT 28.7*  --   --  27.7*  --  28.5*  PLT 159  --   --  204  --  274  LABPROT 19.6*  --   --  18.4*  --  17.8*  INR 1.68  --   --  1.54  --  1.48  HEPARINUNFRC  --   --   --   --  <0.10* 0.23*  CREATININE  --  4.80* 2.57* 3.76*  --   --     Estimated Creatinine Clearance: 23.7 mL/min (A) (by C-G formula based on SCr of 3.76 mg/dL (H)).   Medical History: Past Medical History:  Diagnosis Date  . Angina   . ASCVD (arteriosclerotic cardiovascular disease)    , Anterior infarction 2005, LAD diagonal bifurcation intervention 03/2004  . Automatic implantable cardiac defibrillator -[employer]       . Benign neoplasm of colon   . CHF (congestive heart failure) ([place])   . Chronic systolic heart failure ([place])   . Coronary artery disease     Widely patent previously placed stents in the left anterior   . Crohn's disease ([place])   . 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 Dialysis patient ([place])   . Dyspnea   . Gastroparesis   . GERD (gastroesophageal reflux disease)   . High cholesterol   . Hyperlipidemia   . Hypersomnolent    Previous diagnosis of narcolepsy  . Hypertension, essential   . Ischemic cardiomyopathy    Ejection fraction 15-20% catheterization 2010  . Type II or unspecified type diabetes mellitus without mention of complication, not stated as uncontrolled   . Unspecified gastritis and gastroduodenitis without mention of  hemorrhage     Medications:  Scheduled:  . aspirin EC  81 mg Oral Daily  . atorvastatin  40 mg Oral q1800  . bisacodyl  10 mg Oral Daily   Or  . bisacodyl  10 mg Rectal Daily  . Chlorhexidine Gluconate Cloth  6 each Topical Daily  . citalopram  20 mg Oral Daily  . clonazePAM  1 mg Oral QHS  . docusate sodium  200 mg Oral Daily  . erythromycin  250 mg Oral TID  . feeding supplement (GLUCERNA SHAKE)  237 mL Oral TID BM  . hydrALAZINE  12.5 mg Oral Once  . hydrALAZINE  25 mg Oral 3 times per day on Sun Tue Thu Sat  . insulin aspart  0-5 Units Subcutaneous QHS  . insulin aspart  0-9 Units Subcutaneous TID WC  . mouth rinse  15 mL Mouth Rinse BID  . pantoprazole  40 mg Oral BID  . sodium chloride flush  10-40 mL Intracatheter Q12H  . sodium chloride flush  3 mL Intravenous Q12H  . warfarin  5 mg Oral q1800  . Warfarin - Pharmacist Dosing Inpatient   Does not apply q1800    Assessment: 49 yom on  warfarin PTA for LVAD/factor V leiden who is now s/p LVAD change out for pump thrombus. Previous warfarin regimen for goal 2-3 is 5 mg daily except 2.5 mg on Friday.    INR on admission was 1.24. Warfarin was restarted at 4 mg on 6/28 and INR increased peaking at 4.13. Warfarin was restarted on 7/1 when INR 2.37.   Today INR fell 1.5, likely related to warfarin being held previously for 2 doses and patient eating more. Hgb is stable, plt stable  LDH stable at 200s. No s/sx of bleeding. Transitioned back from CVVH to HD. Restarted on erythromycin, which can impact INR sensitivity, for gastroparesis.     7/4 PM update: started on heparin for sub-therapeutic INR, initial heparin level is low  7/5 AM update: heparin level trending up but still sub-therapeutic   Goal of Therapy:  Heparin level 0.3-0.5 units/mL INR 2-3 Monitor platelets by anticoagulation protocol: Yes   Plan:  Inc heparin drip to 1350 units/hr 1300 HL  Narda Bonds, PharmD, BCPS Clinical Pharmacist Phone:  [phone]

## 2018-03-09 NOTE — Procedures (Signed)
Patient seen on Hemodialysis. QB 400, UF goal 2L Treatment adjusted as needed.  Elmarie Shiley MD [facility]. Office # [phone] Pager # [phone] 8:19 AM

## 2018-03-09 NOTE — Progress Notes (Signed)
PT Cancellation Note  Patient Details Name: [name] MRN: [id number] DOB: 04-01-1966   Cancelled Treatment:    Reason Eval/Treat Not Completed: Other (comment)(pt getting dressing changed at this time) PT will continue to follow acutely.    [place] April, PTA Pager: [phone]   03/09/2018, 2:01 PM

## 2018-03-09 NOTE — Progress Notes (Signed)
IV Team: PIV started with [place], renal pt. Spoke with Delsa Sale RN to d/c midline order. Jen RN aware to d/c central line.    Enos Fling RN IV [employer]

## 2018-03-09 NOTE — Progress Notes (Signed)
ANTICOAGULATION CONSULT NOTE - Follow Up  Consult  Pharmacy Consult for warfarin, heparin Indication: LVAD  Allergies  Allergen Reactions  . Metformin And Related Diarrhea    Patient Measurements: Height: 5' 7"  (170.2 cm) Weight: 188 lb 0.8 oz (85.3 kg) IBW/kg (Calculated) : 66.1  Vital Signs: Temp: 98.5 F (36.9 C) (07/05 1135) Temp Source: Oral (07/05 1135) BP: 117/97 (07/05 1200) Pulse Rate: 93 (07/05 1200)  Labs: Recent Labs    03/07/18 0400  03/07/18 1600 03/08/18 0506 03/08/18 2112 03/09/18 0346 03/09/18 0707  HGB 9.2*  --   --  8.9*  --  9.1*  --   HCT 28.7*  --   --  27.7*  --  28.5*  --   PLT 159  --   --  204  --  274  --   LABPROT 19.6*  --   --  18.4*  --  17.8*  --   INR 1.68  --   --  1.54  --  1.48  --   HEPARINUNFRC  --   --   --   --  <0.10* 0.23*  --   CREATININE  --    < > 2.57* 3.76*  --   --  5.48*   < > = values in this interval not displayed.    Estimated Creatinine Clearance: 16.3 mL/min (A) (by C-G formula based on SCr of 5.48 mg/dL (H)).   Medical History: Past Medical History:  Diagnosis Date  . Angina   . ASCVD (arteriosclerotic cardiovascular disease)    , Anterior infarction 2005, LAD diagonal bifurcation intervention 03/2004  . Automatic implantable cardiac defibrillator -[employer]       . Benign neoplasm of colon   . CHF (congestive heart failure) ([place])   . Chronic systolic heart failure ([place])   . Coronary artery disease     Widely patent previously placed stents in the left anterior   . Crohn's disease ([place])   . 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 Dialysis patient ([place])   . Dyspnea   . Gastroparesis   . GERD (gastroesophageal reflux disease)   . High cholesterol   . Hyperlipidemia   . Hypersomnolent    Previous diagnosis of narcolepsy  . Hypertension, essential   . Ischemic cardiomyopathy    Ejection fraction 15-20% catheterization 2010  . Type II or unspecified type diabetes mellitus without  mention of complication, not stated as uncontrolled   . Unspecified gastritis and gastroduodenitis without mention of hemorrhage     Medications:  Scheduled:  . amLODipine  2.5 mg Oral Daily  . aspirin EC  81 mg Oral Daily  . atorvastatin  40 mg Oral q1800  . bisacodyl  10 mg Oral Daily   Or  . bisacodyl  10 mg Rectal Daily  . Chlorhexidine Gluconate Cloth  6 each Topical Daily  . citalopram  20 mg Oral Daily  . clonazePAM  1 mg Oral QHS  . docusate sodium  200 mg Oral Daily  . erythromycin  250 mg Oral TID  . feeding supplement (GLUCERNA SHAKE)  237 mL Oral TID BM  . hydrALAZINE  25 mg Oral Q8H  . insulin aspart  0-5 Units Subcutaneous QHS  . insulin aspart  0-9 Units Subcutaneous TID WC  . mouth rinse  15 mL Mouth Rinse BID  . pantoprazole  40 mg Oral BID  . sodium chloride flush  10-40 mL Intracatheter Q12H  . sodium chloride flush  3 mL Intravenous Q12H  . warfarin  6 mg Oral ONCE-1800  . Warfarin - Pharmacist Dosing Inpatient   Does not apply q1800    Assessment: 46 yom on warfarin PTA for LVAD/factor V leiden who is now s/p LVAD change out for pump thrombus. Previous warfarin regimen for goal 2-3 is 5 mg daily except 2.5 mg on Friday.    INR on admission was 1.24.    Today INR fell 1.48, likely related to warfarin being held previously for 2 doses and patient eating more. Hgb is stable, plt stable  LDH stable at 200s. No s/sx of bleeding. Transitioned back from CVVH to HD. Restarted on erythromycin, which can impact INR sensitivity, for gastroparesis.   Heparin gtt rate increased overnight for low level, however heparin was held prior to level being checked for central line removal.  Asked to resume at 3 PM.    Goal of Therapy:  HL 0.3-0.5 INR 2-3 Monitor platelets by anticoagulation protocol: Yes   Plan:  Resume IV Heparin at 1350 units/hr at 3 PM. Check heparin level 8 hrs after gtt resumed. Daily heparin level and CBC. Coumadin 6 mg x 1 tonight. Daily  INR.  Marguerite Olea, [facility] Clinical Pharmacist Phone [phone]  03/09/2018 1:16 PM

## 2018-03-09 NOTE — Progress Notes (Signed)
Pharmacy notified of Heparin gtt being off from 2025 to 2100 due to occluded cath. Old site removed and IV team used ultrasound to get a new site. Tubing changed and IV restarted at 1350 units as ordered. Will continue to monitor closely.

## 2018-03-09 NOTE — Progress Notes (Signed)
Patient ID: [name], male   DOB: [birth year], 52 y.o.   MRN: [id number]  [facility] Progress Note   Assessment/ Plan:   1.  Status post left ventricular assist device pump thrombosis: Status post LVAD pump removal/exchange with redo sternotomy on 02/28/2018.  Continues to show improvement with ongoing wound care-off pressors/milrinone.  On Coumadin with INR 1.5. 2. End-stage renal disease: Continue hemodialysis on a Monday/Wednesday/Friday schedule, clinically appears to be close to his dry weight and will continue to challenge this cautiously to limit hypotension with LVAD.  Functional right upper arm AV graft. 3. 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 5. Nutrition: Continue renal diet, renal multivitamin and supplementation with Glucerna.  Albumin 2.9  Subjective:   Without acute events overnight, denies any chest pain or shortness of breath  Objective:   BP 97/85 (BP Location: Left Arm)   Pulse 100   Temp 99.3 F (37.4 C) (Oral)   Resp (!) 27   Ht 5' 7"  (1.702 m)   Wt 85.3 kg (188 lb 0.8 oz)   SpO2 99%   BMI 29.45 kg/m   Physical Exam: Gen: Sitting comfortably in bed, getting cannulated for hemodialysis CVS: Pulse regular rhythm and normal rate, LVAD hum audible over precordium Resp: Anteriorly clear to auscultation, no rales/rhonchi Abd: Soft, flat, nontender Ext: Trace lower extremity edema, RUA AVG being cannulated  Labs: BMET Recent Labs  Lab 03/05/18 0456 03/05/18 2048 03/06/18 0349 03/06/18 1632 03/07/18 0401 03/07/18 1600 03/08/18 0506  NA 135  137 137 135 137 132* 139 134*  K 4.7  3.9 4.2 3.9 4.1 4.2 4.0 4.1  CL 100  102 102 102 103 100 100 101  CO2 25  27 25 26 25 23 30 27   GLUCOSE 118*  117* 143* 124* 142* 119* 175* 165*  BUN 6  6 8 12 15 19 8 15   CREATININE 2.08*  2.06* 2.76* 3.29* 4.07* 4.80* 2.57* 3.76*  CALCIUM 8.0*   8.1* 8.2* 7.7* 7.7* 7.6* 7.7* 7.6*  PHOS 1.2* 3.6 3.4 3.1 3.3 2.0* 2.6   CBC Recent Labs  Lab 03/06/18 0349 03/07/18 0400 03/08/18 0506 03/09/18 0346  WBC 9.0 11.5* 10.0 11.4*  NEUTROABS 6.5 8.1* 6.7 7.9*  HGB 9.1* 9.2* 8.9* 9.1*  HCT 28.2* 28.7* 27.7* 28.5*  MCV 90.7 91.1 91.7 92.5  PLT 123* 159 204 274    Medications:    . aspirin EC  81 mg Oral Daily  . atorvastatin  40 mg Oral q1800  . bisacodyl  10 mg Oral Daily   Or  . bisacodyl  10 mg Rectal Daily  . Chlorhexidine Gluconate Cloth  6 each Topical Daily  . citalopram  20 mg Oral Daily  . clonazePAM  1 mg Oral QHS  . docusate sodium  200 mg Oral Daily  . erythromycin  250 mg Oral TID  . feeding supplement (GLUCERNA SHAKE)  237 mL Oral TID BM  . hydrALAZINE  25 mg Oral 3 times per day on Sun Tue Thu Sat  . insulin aspart  0-5 Units Subcutaneous QHS  . insulin aspart  0-9 Units Subcutaneous TID WC  . mouth rinse  15 mL Mouth Rinse BID  . pantoprazole  40 mg Oral BID  . sodium chloride flush  10-40 mL Intracatheter Q12H  . sodium chloride flush  3 mL Intravenous Q12H  . warfarin  5 mg Oral q1800  . Warfarin -  Pharmacist Dosing Inpatient   Does not apply q1800   Elmarie Shiley, MD 03/09/2018, 7:54 AM

## 2018-03-09 NOTE — Progress Notes (Signed)
Right IJ d/c'd per order without difficulty.  Pressure applied x10 min.  No decrease in bld flow.  Manual pressure applied x 30 min.  No change in bleeding.  Heparin placed on pause.  Dr. Prescott Gum notified of inability to achieve hemostasis.  Head increased to 30 degrees per order.   Hemostasis obtained at 1220. Heparin remains off and to be restarted at 1500 per order from Dr. Darcey Nora.  Pharmacist aware of change in heparin IV.  Will continue to monitor closely.

## 2018-03-09 NOTE — Progress Notes (Signed)
9 Days Post-Op Procedure(s) (LRB): TRANSESOPHAGEAL ECHOCARDIOGRAM (TEE) (N/A) REDO INSERTION OF IMPLANTABLE LEFT VENTRICULAR ASSIST DEVICE - HEARTMATE 3 (N/A) Subjective:  Postop day #9 HeartMate 3 LVAD change out for pump thrombus, pump occlusion, 90 degree twisted outflow graft.  [name] Now ambulating around entire ICU Patient has been transitioned from CVVH to intermittent HD Chest x-ray clearing  surgical incisions healing Old driveline tunnel being packed with iodoform Patient ready for transfer to stepdown INR dosing per pharmacy-Coumadin dose being increased VAD speed increased 5400 RPM with improved VAD flows greater than 3.5 L/min  Objective: Vital signs in last 24 hours: Temp:  [98.3 F (36.8 C)-99.5 F (37.5 C)] 98.3 F (36.8 C) (07/05 0835) Pulse Rate:  [48-101] 92 (07/05 1130) Cardiac Rhythm: Normal sinus rhythm (07/05 0715) Resp:  [0-31] 27 (07/05 1130) BP: (97-134)/(80-120) 128/83 (07/05 1130) SpO2:  [54 %-100 %] 100 % (07/05 0830) Weight:  [188 lb 0.8 oz (85.3 kg)] 188 lb 0.8 oz (85.3 kg) (07/05 0500)  Hemodynamic parameters for last 24 hours:  Stable  Intake/Output from previous day: 07/04 0701 - 07/05 0700 In: 997.7 [P.O.:600; I.V.:397.7] Out: -  Intake/Output this shift: No intake/output data recorded.  Comfortable, responsive breathing spontaneously Lungs clear Normal VAD Hum Abdomen soft Extremities warm Neuro intact  Lab Results: Recent Labs    03/08/18 0506 03/09/18 0346  WBC 10.0 11.4*  HGB 8.9* 9.1*  HCT 27.7* 28.5*  PLT 204 274   BMET:  Recent Labs    03/08/18 0506 03/09/18 0707  NA 134* 137  K 4.1 3.9  CL 101 103  CO2 27 27  GLUCOSE 165* 196*  BUN 15 26*  CREATININE 3.76* 5.48*  CALCIUM 7.6* 7.7*    PT/INR:  Recent Labs    03/09/18 0346  LABPROT 17.8*  INR 1.48   ABG    Component Value Date/Time   PHART 7.326 (L) 03/02/2018 1635   HCO3 23.6 03/02/2018 1635   TCO2 25  03/02/2018 1635   ACIDBASEDEF 3.0 (H) 03/02/2018 1635   O2SAT 62.1 03/09/2018 0340   CBG (last 3)  Recent Labs    03/08/18 2215 03/09/18 0659 03/09/18 1121  GLUCAP 180* 203* 116*    Assessment/Plan: S/P Procedure(s) (LRB): TRANSESOPHAGEAL ECHOCARDIOGRAM (TEE) (N/A) REDO INSERTION OF IMPLANTABLE LEFT VENTRICULAR ASSIST DEVICE - HEARTMATE 3 (N/A) Patient doing well now off milrinone Oral intake adequate with good GI function Pack old driveline tunnel with iodoform daily We will remove EPW's next week.  Remove central line today  LOS: 9 days    [name] 03/09/2018

## 2018-03-09 NOTE — Progress Notes (Signed)
PT Cancellation Note  Patient Details Name: [name] MRN: [id number] DOB: 1965-09-25   Cancelled Treatment:    Reason Eval/Treat Not Completed: Patient at procedure or test/unavailable. Pt on HD.   [place] 03/09/2018, 9:11 AM [employer] PT [phone]

## 2018-03-09 NOTE — Progress Notes (Addendum)
Advanced Heart Failure Rounding Note  PCP-Cardiologist: No primary care provider on file.   Subjective:    CVVHD stopped 03/05/18.   Tolerated iHD well 03/07/18.   Feeling good. No orthopnea or PND. Pocket pain improved. + BM. HD in room getting ready to start. MAPs 90-100s this am pre-HD. Was on amlodipine at home (also on non-HD days)  LVAD Interrogation HM 3: Speed: 5300 Flow: 3.2 PI: 7.9 Power: 4.0. VAD interrogated personally. Parameters stable.     Objective:   Weight Range: 188 lb 0.8 oz (85.3 kg) Body mass index is 29.45 kg/m.   Vital Signs:   Temp:  [97.4 F (36.3 C)-99.5 F (37.5 C)] 99.5 F (37.5 C) (07/05 0026) Pulse Rate:  [48-101] 100 (07/05 0700) Resp:  [0-27] 20 (07/05 0700) BP: (104-134)/(80-120) 134/120 (07/05 0700) SpO2:  [54 %-100 %] 99 % (07/05 0700) Weight:  [188 lb 0.8 oz (85.3 kg)] 188 lb 0.8 oz (85.3 kg) (07/05 0500) Last BM Date: 03/08/18  MAPs 90s this am (as high as 120s on non-HD days)  Weight change: Filed Weights   03/07/18 1153 03/08/18 0500 03/09/18 0500  Weight: 189 lb 13.1 oz (86.1 kg) 187 lb (84.8 kg) 188 lb 0.8 oz (85.3 kg)   Intake/Output:   Intake/Output Summary (Last 24 hours) at 03/09/2018 0735 Last data filed at 03/09/2018 0600 Gross per 24 hour  Intake 997.72 ml  Output -  Net 997.72 ml    Physical Exam   General:  NAD.  HEENT: normal  Neck: supple. JVP not elevated.  Carotids 2+ bilat; no bruits. No lymphadenopathy or thryomegaly appreciated. Cor: LVAD hum. Sternal wound looks good.  Lungs: Clear. Abdomen: soft, nontender, non-distended. No hepatosplenomegaly. No bruits or masses. Good bowel sounds. Driveline site clean. Anchor in place.  Extremities: no cyanosis, clubbing, rash. Warm no edema RUE AVF Neuro: alert & oriented x 3. No focal deficits. Moves all 4 without problem   Telemetry   NSR/Sinus tach 90-100s, personally reviewed.   Labs    CBC Recent Labs    03/08/18 0506 03/09/18 0346  WBC 10.0  11.4*  NEUTROABS 6.7 7.9*  HGB 8.9* 9.1*  HCT 27.7* 28.5*  MCV 91.7 92.5  PLT 204 010   Basic Metabolic Panel Recent Labs    03/07/18 0400  03/07/18 1600 03/08/18 0506  NA  --    < > 139 134*  K  --    < > 4.0 4.1  CL  --    < > 100 101  CO2  --    < > 30 27  GLUCOSE  --    < > 175* 165*  BUN  --    < > 8 15  CREATININE  --    < > 2.57* 3.76*  CALCIUM  --    < > 7.7* 7.6*  MG 2.2  --   --  2.0  PHOS  --    < > 2.0* 2.6   < > = values in this interval not displayed.   Liver Function Tests Recent Labs    03/07/18 1600 03/08/18 0506  ALBUMIN 2.5* 2.4*   No results for input(s): LIPASE, AMYLASE in the last 72 hours. Cardiac Enzymes No results for input(s): CKTOTAL, CKMB, CKMBINDEX, TROPONINI in the last 72 hours.  BNP: BNP (last 3 results) Recent Labs    07/03/17 0037 03/02/18 0400 03/08/18 0008  BNP 758.9* 694.1* 1,576.6*    ProBNP (last 3 results) No results for input(s): PROBNP  in the last 8760 hours.   D-Dimer No results for input(s): DDIMER in the last 72 hours. Hemoglobin A1C No results for input(s): HGBA1C in the last 72 hours. Fasting Lipid Panel No results for input(s): CHOL, HDL, LDLCALC, TRIG, CHOLHDL, LDLDIRECT in the last 72 hours. Thyroid Function Tests No results for input(s): TSH, T4TOTAL, T3FREE, THYROIDAB in the last 72 hours.  Invalid input(s): FREET3  Other results:   Imaging    No results found.   Medications:     Scheduled Medications: . aspirin EC  81 mg Oral Daily  . atorvastatin  40 mg Oral q1800  . bisacodyl  10 mg Oral Daily   Or  . bisacodyl  10 mg Rectal Daily  . Chlorhexidine Gluconate Cloth  6 each Topical Daily  . citalopram  20 mg Oral Daily  . clonazePAM  1 mg Oral QHS  . docusate sodium  200 mg Oral Daily  . erythromycin  250 mg Oral TID  . feeding supplement (GLUCERNA SHAKE)  237 mL Oral TID BM  . hydrALAZINE  25 mg Oral 3 times per day on Sun Tue Thu Sat  . insulin aspart  0-5 Units Subcutaneous QHS   . insulin aspart  0-9 Units Subcutaneous TID WC  . mouth rinse  15 mL Mouth Rinse BID  . pantoprazole  40 mg Oral BID  . sodium chloride flush  10-40 mL Intracatheter Q12H  . sodium chloride flush  3 mL Intravenous Q12H  . warfarin  5 mg Oral q1800  . Warfarin - Pharmacist Dosing Inpatient   Does not apply q1800    Infusions: . sodium chloride    . sodium chloride 10 mL/hr at 03/09/18 0600  . heparin 1,350 Units/hr (03/09/18 0600)  . lactated ringers Stopped (03/01/18 1659)  . lactated ringers    . magnesium sulfate      PRN Medications: calcium chloride, LORazepam, morphine injection, ondansetron (ZOFRAN) IV, oxyCODONE, sodium chloride flush, sodium chloride flush, traMADol    Patient Profile   [name] is a 52 year old with a history of CAD and ischemic cardiomyopathy now s/p Heartmate 3 LVAD on 10/18. Post VAD course complicated by ESRD and severe DM gastroparesis. Admitted with low/no-flow alarms due to LVAD outflow graft kink (does not have bend clip).   Assessment/Plan   1. Cardiogenic shock due to LVAD complication with LVAD outflow graft thrombosis - s/p Emergent LVAD pump exchange 6/27 - [place] 62.1% off milrinone.  - Volume status OK -> Per HD.  - Tachycardia (sinus tach) somewhat improved. Avoid b-blocker currently if possible  2. VAD - VAD interrogated personally. Parameters stable.   - LDH 206 - INR 1.48 today. Started on heparin 03/08/18. Discussed dosing with PharmD personally.    3. Intractable Nausea/Vomiting secondary to gastroparesis - Having regular BMs - Continue reglan, phenergan, and ativan as needed - Continue erythromycin 250 mg TID a day for gastroparesis  He has been seen by Dr Derrill Kay on 01/24/18 [facility]) - No change to current plan.    4. ESRD - CVVHD stopped 03/05/18.  - Tolerating iHD now  - No hydralazine on HD days.  - No change to current plan.    5. DM,insulin dependent - SSI for now.   6. Acute respiratory  failure - Extubated 6/28  Stable on [place].  - CXR this 7/3 with low volume. Encouraged IS and flutter valve.  - Up to chair and mobilize. As tolerated.  - No change to current plan.  7. HTN - MAPs elevated on non-HD days.  - Hydralazine increased to 25 mg TID on HD days.  - May need to amlodipine.   OK for 2C.   I reviewed the LVAD parameters from today, and compared the results to the patient's prior recorded data.  No programming changes were made.  The LVAD is functioning within specified parameters.  The patient performs LVAD self-test daily.  LVAD interrogation was negative for any significant power changes, alarms or PI events/speed drops.  LVAD equipment check completed and is in good working order.  Back-up equipment present.   LVAD education done on emergency procedures and precautions and reviewed exit site care.   Length of Stay: [address]  Annamaria Helling  03/09/2018, 7:35 AM  Advanced Heart Failure Team Pager [phone] (M-F; 7a - 4p)  Please contact [facility] for night-coverage after hours (4p -7a ) and weekends on [email or web address]  Patient seen and examined with the above-signed Advanced Practice Provider and/or Housestaff. I personally reviewed laboratory data, imaging studies and relevant notes. I independently examined the patient and formulated the important aspects of the plan. I have edited the note to reflect any of my changes or salient points. I have personally discussed the plan with the patient and/or family.  Tolerating iHD well. Volume status up slightly but getting iHD today. VAD interrogated personally. Parameters stable. MAPs high - will resume home amlodipine on non-HD days. INR 1.5. Continue heparin. Discussed dosing with PharmD personally. Gastroparesis doing ok. Can go to [facility] today.  [name], MD  8:41 AM

## 2018-03-09 NOTE — Progress Notes (Signed)
LVAD Coordinator Rounding Note:  Admitted 02/28/18 from the ED for 0 Flow from ED. Pt was taken emergently to the OR for outflow graft twist/obstruction.  HM III LVAD implanted on 06/12/17 by Dr. [facility] Gum under Destination Therapy criteria due to renal insufficiency. Pump exchange on 03/01/18 for outflow graft twist/obstruction.   Pt awake, in bed having dialysis treatment.  INR 1.48 - not on heparin gtt, will continue to monitor.  Vital signs: Temp: 99.3 HR:  100 Doppler Pressure:  Not done Auto BP:  116/89 O2 Sat: 100% on RA Wt: 176>166>193>180>185>184>190>189>187>188 lbs  LVAD interrogation reveals:  Speed: 5300 Flow: 3.3 Power: 4.1w PI: 7.4 Alarms: none  Events:  none Hematocrit: 28 Fixed speed: 5200 Low speed limit: 4900  Drive Line: Gauze dressing dry and intact; anchor intact and accurately applied. Existing VAD dressing removed and site care performed using sterile technique. Drive line exit site cleaned with Chlora prep applicators x 2, allowed to dry, and gauzedressing with silver stripre-applied. Exit site healingand unincorporated, the velour is fully implanted at exit site.2 stitches intact. Moderate amount of brown serous drainage with no redness, tenderness, foul odor or rash noted.Drive line anchors intactx 2.      Removed iodoform from old left abdominal driveline site and repacked with 2" iodoform using sterile technique. Covered with a 4 x 4 and tape.  Labs:  LDH trend: 384>253>241>304>217>207>197>206  INR trend: 1.70>3.07>4.13>2.37>1.95>1.68>1.54>1.48  Blood Products: Intra-op: 03/01/18 - 5 PRBCs     2 PLT     2 FFP     2 Cryo  Gtts: Milrinone 0.125 mcg/kg/min Levo 12 mcg/min (stopped 03/04/18) Neo 25 mcg/min (stopped 03/03/18) Epi 1 mcg/min (stopped 03/03/18)  Anticoagulation Plan: -INR Goal: 2.0 - 3.0 -ASA Dose: 81 mg daily   Device: -St Jude dual  -Therapies: currently off - Monitor: on VT 150 bpm  Adverse Events on VAD: - ESRD  post LVAD; started CVVH 06/15/17; HD started 06/20/17 - Hospitalization 11/5 - 07/16/17 > gastroparesis diagnosis after EGD - Hospitalization 11/26 - 08/04/17 > gastroparesis - Hospitalization 1/8 - 09/16/17>gastroparesis - referred to Dr. Corliss Parish at [facility] - 10/31/17>rightupper arm arteriovenous graft with Artegraft; ligation of right first stage brachial vein transposition  - 12/11/13 elective admission for thrombectomy for AV graft in RUA  - 12/20/17 > intractable N/V - 01/19/18 > intractable N/V sent by EMS from dialysis center - 03/01/18 > outflow graft twist/occulsion requiring pump exchange   Plan/Recommendations:  1. Dressing change with old DL site packed daily by VAD Coordinator, Nurse Davonna Belling, or trained caregiver.  2. Please page VAD coordinator with any equipment questions or concerns.  [name] RN, VAD Coordinator 24/7 VAD Pager: [phone]

## 2018-03-10 ENCOUNTER — Inpatient Hospital Stay (HOSPITAL_COMMUNITY): Payer: BLUE CROSS/BLUE SHIELD

## 2018-03-10 DIAGNOSIS — Z452 Encounter for adjustment and management of vascular access device: Secondary | ICD-10-CM

## 2018-03-10 LAB — GLUCOSE, CAPILLARY
GLUCOSE-CAPILLARY: 161 mg/dL — AB (ref 70–99)
Glucose-Capillary: 146 mg/dL — ABNORMAL HIGH (ref 70–99)
Glucose-Capillary: 145 mg/dL — ABNORMAL HIGH (ref 70–99)
Glucose-Capillary: 134 mg/dL — ABNORMAL HIGH (ref 70–99)

## 2018-03-10 LAB — BASIC METABOLIC PANEL
Anion gap: 13 (ref 5–15)
BUN: 16 mg/dL (ref 6–20)
CO2: 26 mmol/L (ref 22–32)
Calcium: 8.4 mg/dL — ABNORMAL LOW (ref 8.9–10.3)
Chloride: 97 mmol/L — ABNORMAL LOW (ref 98–111)
Creatinine, Ser: 4.03 mg/dL — ABNORMAL HIGH (ref 0.61–1.24)
GFR calc Af Amer: 18 mL/min — ABNORMAL LOW (ref >60)
GFR calc non Af Amer: 16 mL/min — ABNORMAL LOW (ref >60)
Glucose, Bld: 167 mg/dL — ABNORMAL HIGH (ref 70–99)
Potassium: 3.9 mmol/L (ref 3.5–5.1)
Sodium: 136 mmol/L (ref 135–145)

## 2018-03-10 LAB — PROTIME-INR
INR: 1.47
PROTHROMBIN TIME: 17.7 s — AB (ref 11.4–15.2)

## 2018-03-10 LAB — HEPARIN LEVEL (UNFRACTIONATED)
HEPARIN UNFRACTIONATED: 0.2 [IU]/mL — AB (ref 0.30–0.70)
Heparin Unfractionated: 0.39 IU/mL (ref 0.30–0.70)

## 2018-03-10 LAB — LACTATE DEHYDROGENASE: LDH: 223 U/L — AB (ref 98–192)

## 2018-03-10 MED ORDER — HYDRALAZINE HCL 20 MG/ML IJ SOLN
10.0000 mg | INTRAMUSCULAR | Status: DC | PRN
  Administered 2018-03-10 – 2018-03-18 (×10): 10 mg via INTRAVENOUS
  Filled 2018-03-10 (×11): qty 1

## 2018-03-10 MED ORDER — WARFARIN SODIUM 7.5 MG PO TABS
7.5000 mg | ORAL_TABLET | Freq: Once | ORAL | Status: AC
  Administered 2018-03-10: 7.5 mg via ORAL
  Filled 2018-03-10: qty 1

## 2018-03-10 MED ORDER — LORAZEPAM 2 MG/ML IJ SOLN
1.0000 mg | INTRAMUSCULAR | Status: DC | PRN
  Administered 2018-03-10 – 2018-03-18 (×19): 1 mg via INTRAVENOUS
  Filled 2018-03-10 (×18): qty 1

## 2018-03-10 MED ORDER — MIDAZOLAM HCL 2 MG/2ML IJ SOLN
INTRAMUSCULAR | Status: AC
  Administered 2018-03-10: 1 mg
  Filled 2018-03-10: qty 2

## 2018-03-10 NOTE — Progress Notes (Signed)
Patient ID: [name], male   DOB: 1966/07/20, 52 y.o.   MRN: [id number]  [facility] Progress Note   Assessment/ Plan:   1.  Status post left ventricular assist device pump thrombosis: Status post LVAD pump removal/exchange with redo sternotomy on 02/28/2018.  Continues to show improvement with ongoing wound care-off pressors/milrinone.  On Coumadin with INR pending. 2. End-stage renal disease: Continue hemodialysis on a Monday/Wednesday/Friday schedule via his right upper arm AV graft.  No acute indications for dialysis today, 3. 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 5. Nutrition: Continue renal diet, renal multivitamin and supplementation with Glucerna.  Albumin 2.4 6.  Nausea/vomiting: Secondary to gastroparesis, symptomatic management and changes in antihypertensive therapy choices noted.  Subjective:   Complains of nausea/feeling fatigued.  Tolerated hemodialysis yesterday and was transferred to stepdown unit.  Being prepared for central line placement.  Objective:   BP 102/72 (BP Location: Left Arm)   Pulse 94   Temp 98.1 F (36.7 C) (Oral)   Resp 18   Ht 5' 8"  (1.727 m)   Wt 82.6 kg (182 lb 3.2 oz)   SpO2 92%   BMI 27.70 kg/m   Physical Exam: Gen: Resting comfortably in bed CVS: Pulse regular rhythm and normal rate, LVAD hum audible over precordium Resp: Anteriorly clear to auscultation, no rales/rhonchi Abd: Soft, flat, nontender Ext: Trace lower extremity edema, RUA AVG being cannulated  Labs: BMET Recent Labs  Lab 03/05/18 0456 03/05/18 2048 03/06/18 0349 03/06/18 1632 03/07/18 0401 03/07/18 1600 03/08/18 0506 03/09/18 0707  NA 135  137 137 135 137 132* 139 134* 137  K 4.7  3.9 4.2 3.9 4.1 4.2 4.0 4.1 3.9  CL 100  102 102 102 103 100 100 101 103  CO2 25  27 25 26 25 23 30 27 27   GLUCOSE 118*  117* 143* 124* 142* 119* 175*  165* 196*  BUN 6  6 8 12 15 19 8 15  26*  CREATININE 2.08*  2.06* 2.76* 3.29* 4.07* 4.80* 2.57* 3.76* 5.48*  CALCIUM 8.0*  8.1* 8.2* 7.7* 7.7* 7.6* 7.7* 7.6* 7.7*  PHOS 1.2* 3.6 3.4 3.1 3.3 2.0* 2.6  --    CBC Recent Labs  Lab 03/06/18 0349 03/07/18 0400 03/08/18 0506 03/09/18 0346 03/09/18 1532  WBC 9.0 11.5* 10.0 11.4* 14.8*  NEUTROABS 6.5 8.1* 6.7 7.9*  --   HGB 9.1* 9.2* 8.9* 9.1* 10.0*  HCT 28.2* 28.7* 27.7* 28.5* 31.2*  MCV 90.7 91.1 91.7 92.5 91.0  PLT 123* 159 204 274 300    Medications:    . amLODipine  2.5 mg Oral Daily  . aspirin EC  81 mg Oral Daily  . atorvastatin  40 mg Oral q1800  . bisacodyl  10 mg Oral Daily   Or  . bisacodyl  10 mg Rectal Daily  . Chlorhexidine Gluconate Cloth  6 each Topical Daily  . citalopram  20 mg Oral Daily  . clonazePAM  1 mg Oral QHS  . docusate sodium  200 mg Oral Daily  . erythromycin  250 mg Oral TID  . feeding supplement (GLUCERNA SHAKE)  237 mL Oral TID BM  . hydrALAZINE  25 mg Oral Q8H  . insulin aspart  0-5 Units Subcutaneous QHS  . insulin aspart  0-9 Units Subcutaneous TID WC  . mouth rinse  15 mL Mouth Rinse BID  . metoCLOPramide (REGLAN) injection  10 mg Intravenous Q6H  .  pantoprazole  40 mg Oral BID  . sodium chloride flush  10-40 mL Intracatheter Q12H  . sodium chloride flush  3 mL Intravenous Q12H  . Warfarin - Pharmacist Dosing Inpatient   Does not apply [id number]   Elmarie Shiley, MD 03/10/2018, 7:55 AM

## 2018-03-10 NOTE — Progress Notes (Signed)
Dr. Charlaine Dalton notified of patients MAP reading 148. Patient has been nauseated and jagging since 2130 tonight and he is unable to take PO meds. Patient received Zofran, Phenergan and Reglan for his nausea, with little relief. Hydralazine 21m IV was ordered once and given. Will continue to monitor closely. See VAD results in flowsheet for 0000.

## 2018-03-10 NOTE — Progress Notes (Signed)
Central Line site with sanguinous drainage from jagging and restart of Heparin gtt. Dressing changed and new pressure dressing applied. Will continue to monitor sight.

## 2018-03-10 NOTE — Progress Notes (Signed)
ANTICOAGULATION CONSULT NOTE - Follow Up  Consult  Pharmacy Consult for warfarin, heparin Indication: LVAD  Allergies  Allergen Reactions  . Metformin And Related Diarrhea    Patient Measurements: Height: 5' 8"  (172.7 cm) Weight: 182 lb 3.2 oz (82.6 kg) IBW/kg (Calculated) : 68.4  Vital Signs: Temp: 98.5 F (36.9 C) (07/06 1546) Temp Source: Oral (07/06 1546) BP: 115/93 (07/06 1546) Pulse Rate: 95 (07/06 1546)  Labs: Recent Labs    03/08/18 0506  03/09/18 0346 03/09/18 0707 03/09/18 1532 03/10/18 0850 03/10/18 1825  HGB 8.9*  --  9.1*  --  10.0*  --   --   HCT 27.7*  --  28.5*  --  31.2*  --   --   PLT 204  --  274  --  300  --   --   LABPROT 18.4*  --  17.8*  --   --  17.7*  --   INR 1.54  --  1.48  --   --  1.47  --   HEPARINUNFRC  --    < > 0.23*  --   --  0.20* 0.39  CREATININE 3.76*  --   --  5.48*  --  4.03*  --    < > = values in this interval not displayed.    Estimated Creatinine Clearance: 22.2 mL/min (A) (by C-G formula based on SCr of 4.03 mg/dL (H)).   Medical History: Past Medical History:  Diagnosis Date  . Angina   . ASCVD (arteriosclerotic cardiovascular disease)    , Anterior infarction 2005, LAD diagonal bifurcation intervention 03/2004  . Automatic implantable cardiac defibrillator -St. Jude's       . Benign neoplasm of colon   . CHF (congestive heart failure) ([place])   . Chronic systolic heart failure ([place])   . Coronary artery disease     Widely patent previously placed stents in the left anterior   . Crohn's disease ([place])   . 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 Dialysis patient ([place])   . Dyspnea   . Gastroparesis   . GERD (gastroesophageal reflux disease)   . High cholesterol   . Hyperlipidemia   . Hypersomnolent    Previous diagnosis of narcolepsy  . Hypertension, essential   . Ischemic cardiomyopathy    Ejection fraction 15-20% catheterization 2010  . Type II or unspecified type diabetes mellitus without  mention of complication, not stated as uncontrolled   . Unspecified gastritis and gastroduodenitis without mention of hemorrhage     Assessment: [name] on warfarin PTA for LVAD/factor V leiden who is now s/p LVAD change out for pump thrombus. Previous warfarin regimen for goal 2-3 is 5 mg daily except 2.5 mg on Friday. INR on admission was 1.24.  Restarted on erythromycin, which can impact INR sensitivity, for gastroparesis.  -heparin level is at goal     Goal of Therapy:  HL 0.3-0.5 INR 2-3 Monitor platelets by anticoagulation protocol: Yes   Plan: Continue IV Heparin at 1350 units/hr  Daily heparin level and CBC  Hildred Laser, PharmD Clinical Pharmacist Please check Amion for pharmacy contact number

## 2018-03-10 NOTE — Progress Notes (Addendum)
ANTICOAGULATION CONSULT NOTE - Follow Up  Consult  Pharmacy Consult for warfarin, heparin Indication: LVAD  Allergies  Allergen Reactions  . Metformin And Related Diarrhea    Patient Measurements: Height: 5' 8"  (172.7 cm) Weight: 182 lb 3.2 oz (82.6 kg) IBW/kg (Calculated) : 68.4  Vital Signs: Temp: 98.1 F (36.7 C) (07/06 0324) Temp Source: Oral (07/06 0324) BP: 102/72 (07/06 0324) Pulse Rate: 94 (07/06 0400)  Labs: Recent Labs    03/08/18 0506 03/08/18 2112 03/09/18 0346 03/09/18 0707 03/09/18 1532 03/10/18 0850  HGB 8.9*  --  9.1*  --  10.0*  --   HCT 27.7*  --  28.5*  --  31.2*  --   PLT 204  --  274  --  300  --   LABPROT 18.4*  --  17.8*  --   --  17.7*  INR 1.54  --  1.48  --   --  1.47  HEPARINUNFRC  --  <0.10* 0.23*  --   --  0.20*  CREATININE 3.76*  --   --  5.48*  --  4.03*    Estimated Creatinine Clearance: 22.2 mL/min (A) (by C-G formula based on SCr of 4.03 mg/dL (H)).   Medical History: Past Medical History:  Diagnosis Date  . Angina   . ASCVD (arteriosclerotic cardiovascular disease)    , Anterior infarction 2005, LAD diagonal bifurcation intervention 03/2004  . Automatic implantable cardiac defibrillator -St. Jude's       . Benign neoplasm of colon   . CHF (congestive heart failure) ([place])   . Chronic systolic heart failure ([place])   . Coronary artery disease     Widely patent previously placed stents in the left anterior   . Crohn's disease ([place])   . 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 Dialysis patient ([place])   . Dyspnea   . Gastroparesis   . GERD (gastroesophageal reflux disease)   . High cholesterol   . Hyperlipidemia   . Hypersomnolent    Previous diagnosis of narcolepsy  . Hypertension, essential   . Ischemic cardiomyopathy    Ejection fraction 15-20% catheterization 2010  . Type II or unspecified type diabetes mellitus without mention of complication, not stated as uncontrolled   . Unspecified gastritis and  gastroduodenitis without mention of hemorrhage     Assessment: 52 yom on warfarin PTA for LVAD/factor V leiden who is now s/p LVAD change out for pump thrombus. Previous warfarin regimen for goal 2-3 is 5 mg daily except 2.5 mg on Friday.    INR on admission was 1.24.    Today INR unchanged at 1.4, worsened gastroparesis overnight.  LDH stable at 200s. No s/sx of bleeding. Transitioned back from CVVH to HD. Restarted on erythromycin, which can impact INR sensitivity, for gastroparesis.   Heparin level low this am, however heparin infusion was paused for line placement so inaccurate. D/w nurse will continue at current rate and recheck level with new line this evening.      Goal of Therapy:  HL 0.3-0.5 INR 2-3 Monitor platelets by anticoagulation protocol: Yes   Plan:  Continue IV Heparin at 1350 units/hr  Recheck level tonight with new central line Coumadin 7.5 mg x 1 tonight. Daily INR.  Erin Hearing PharmD., BCPS Clinical Pharmacist 03/10/2018 10:50 AM

## 2018-03-10 NOTE — Progress Notes (Signed)
Advanced Heart Failure Rounding Note  PCP-Cardiologist: No primary care provider on file.   Subjective:    CVVHD stopped 03/05/18.   Tolerating iHD well.   Amlodipine started yesterday for high MAPs. Had worsening gastroparesis and nausea last night. Unable to take orals. MAP up to 140 last night treated with IV hydralazine. MAPs 100 this am   Feels miserable this am. Weak. No SOB, orthopnea, PND.  LVAD Interrogation HM 3: Speed: 5400 Flow: 3.9 PI: 3.6 Power: 4.0. VAD interrogated personally. Parameters stable.   Objective:   Weight Range: 82.6 kg (182 lb 3.2 oz) Body mass index is 27.7 kg/m.   Vital Signs:   Temp:  [98.1 F (36.7 C)-98.6 F (37 C)] 98.1 F (36.7 C) (07/06 0324) Pulse Rate:  [59-111] 94 (07/06 0400) Resp:  [11-32] 18 (07/06 0324) BP: (102-152)/(72-108) 102/72 (07/06 0324) SpO2:  [92 %-100 %] 92 % (07/06 0400) Weight:  [82.6 kg (182 lb 3.2 oz)-82.9 kg (182 lb 12.2 oz)] 82.6 kg (182 lb 3.2 oz) (07/06 0614) Last BM Date: 03/09/18  MAPs 100-140s  Personally reviewed   Weight change: Filed Weights   03/09/18 1700 03/09/18 1747 03/10/18 0614  Weight: 82.9 kg (182 lb 12.2 oz) 82.9 kg (182 lb 12.2 oz) 82.6 kg (182 lb 3.2 oz)   Intake/Output:   Intake/Output Summary (Last 24 hours) at 03/10/2018 0741 Last data filed at 03/10/2018 0400 Gross per 24 hour  Intake 495.49 ml  Output 2490 ml  Net -1994.51 ml    Physical Exam   General:  NAD. Weak appearing. Nauseated HEENT: normal  Neck: supple. JVP not elevated.  Carotids 2+ bilat; no bruits. No lymphadenopathy or thryomegaly appreciated. Cor: LVAD hum.  Lungs: Clear. Abdomen: obese soft, nontender, non-distended. No hepatosplenomegaly. No bruits or masses. Good bowel sounds. Driveline site clean. Anchor in place.  Extremities: no cyanosis, clubbing, rash. Warm no edema  RUE AVF Neuro: alert & oriented x 3. No focal deficits. Moves all 4 without problem    Telemetry   NSR/Sinus tach 90-100s,  personally reviewed.   Labs    CBC Recent Labs    03/08/18 0506 03/09/18 0346 03/09/18 1532  WBC 10.0 11.4* 14.8*  NEUTROABS 6.7 7.9*  --   HGB 8.9* 9.1* 10.0*  HCT 27.7* 28.5* 31.2*  MCV 91.7 92.5 91.0  PLT 204 274 295   Basic Metabolic Panel Recent Labs    03/07/18 1600 03/08/18 0506 03/09/18 0707  NA 139 134* 137  K 4.0 4.1 3.9  CL 100 101 103  CO2 30 27 27   GLUCOSE 175* 165* 196*  BUN 8 15 26*  CREATININE 2.57* 3.76* 5.48*  CALCIUM 7.7* 7.6* 7.7*  MG  --  2.0  --   PHOS 2.0* 2.6  --    Liver Function Tests Recent Labs    03/07/18 1600 03/08/18 0506  ALBUMIN 2.5* 2.4*   No results for input(s): LIPASE, AMYLASE in the last 72 hours. Cardiac Enzymes No results for input(s): CKTOTAL, CKMB, CKMBINDEX, TROPONINI in the last 72 hours.  BNP: BNP (last 3 results) Recent Labs    07/03/17 0037 03/02/18 0400 03/08/18 0008  BNP 758.9* 694.1* 1,576.6*    ProBNP (last 3 results) No results for input(s): PROBNP in the last 8760 hours.   D-Dimer No results for input(s): DDIMER in the last 72 hours. Hemoglobin A1C No results for input(s): HGBA1C in the last 72 hours. Fasting Lipid Panel No results for input(s): CHOL, HDL, LDLCALC, TRIG, CHOLHDL, LDLDIRECT in  the last 72 hours. Thyroid Function Tests No results for input(s): TSH, T4TOTAL, T3FREE, THYROIDAB in the last 72 hours.  Invalid input(s): FREET3  Other results:   Imaging    No results found.   Medications:     Scheduled Medications: . amLODipine  2.5 mg Oral Daily  . aspirin EC  81 mg Oral Daily  . atorvastatin  40 mg Oral q1800  . bisacodyl  10 mg Oral Daily   Or  . bisacodyl  10 mg Rectal Daily  . Chlorhexidine Gluconate Cloth  6 each Topical Daily  . citalopram  20 mg Oral Daily  . clonazePAM  1 mg Oral QHS  . docusate sodium  200 mg Oral Daily  . erythromycin  250 mg Oral TID  . feeding supplement (GLUCERNA SHAKE)  237 mL Oral TID BM  . hydrALAZINE  25 mg Oral Q8H  . insulin  aspart  0-5 Units Subcutaneous QHS  . insulin aspart  0-9 Units Subcutaneous TID WC  . mouth rinse  15 mL Mouth Rinse BID  . metoCLOPramide (REGLAN) injection  10 mg Intravenous Q6H  . pantoprazole  40 mg Oral BID  . sodium chloride flush  10-40 mL Intracatheter Q12H  . sodium chloride flush  3 mL Intravenous Q12H  . Warfarin - Pharmacist Dosing Inpatient   Does not apply q1800    Infusions: . sodium chloride    . sodium chloride 10 mL/hr at 03/09/18 0800  . heparin 1,350 Units/hr (03/10/18 0653)  . 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 Post VAD course complicated by ESRD and severe DM gastroparesis. Admitted with low/no-flow alarms due to LVAD outflow graft kink (does not have bend clip).   Assessment/Plan   1. Cardiogenic shock due to LVAD complication with LVAD outflow graft thrombosis - s/p Emergent LVAD pump exchange 6/27 - Off milrinone. Central line out. Unable to get co-ox - Volume status OK -> Per HD.  - Tachycardia (sinus tach) somewhat improved. Avoid b-blocker currently if possible  2. VAD - VAD interrogated personally. Parameters stable. - Unable to draw labs today due to poor veins. Will need central line. I will place - Started on heparin 03/08/18. No bleeding. Will need central line for labs   3. Intractable Nausea/Vomiting secondary to gastroparesis - Much worse today after restarting amlodipine. Will stop.  - Use hydralazine +/- doxazosin - Continue reglan, phenergan, and ativan as needed - Continue erythromycin 250 mg TID a day for gastroparesis  He has been seen by Dr Derrill Kay on 01/24/18 [facility] Gastroparesis Specialist) - No change to current plan.    4. ESRD - CVVHD stopped 03/05/18.  - Tolerating  iHD now  - No change to current plan.    5. DM,insulin dependent - SSI for now.   6. Acute respiratory failure - Extubated 6/28  Stable on [place].  - CXR this 7/3 with low volume. Encouraged IS and flutter valve.  - Up to chair and mobilize. As tolerated.  - No change to current plan.    7. HTN - MAPs very elevated - as above use hydralazine and doxazosin   Continue to mobilize as able/   I reviewed the LVAD parameters from today, and compared the results to the  patient's prior recorded data.  No programming changes were made.  The LVAD is functioning within specified parameters.  The patient performs LVAD self-test daily.  LVAD interrogation was negative for any significant power changes, alarms or PI events/speed drops.  LVAD equipment check completed and is in good working order.  Back-up equipment present.   LVAD education done on emergency procedures and precautions and reviewed exit site care.   Length of Stay: [place], MD  03/10/2018, 7:41 AM  Advanced Heart Failure Team Pager [phone] (M-F; 7a - 4p)  Please contact [facility] for night-coverage after hours (4p -7a ) and weekends on [email or web address]

## 2018-03-10 NOTE — Procedures (Signed)
Central Venous Catheter Insertion Procedure Note [name] [id number] 1966-05-20  Procedure: Insertion of Central Venous Catheter Indications: Drug and/or fluid administration  Procedure Details Consent: Risks of procedure as well as the alternatives and risks of each were explained to the (patient/caregiver).  Consent for procedure obtained. Time Out: Verified patient identification, verified procedure, site/side was marked, verified correct patient position, special equipment/implants available, medications/allergies/relevent history reviewed, required imaging and test results available.  Performed  Maximum sterile technique was used including antiseptics, cap, gloves, gown, hand hygiene, mask and sheet. Skin prep: Chlorhexidine; local anesthetic administered A antimicrobial bonded/coated triple lumen catheter was placed in the left internal jugular vein using the Seldinger technique and real-time u/s guidance.  Evaluation Blood flow good Complications: No apparent complications Patient did tolerate procedure well. Chest X-ray ordered to verify placement.  CXR: pending.  [name] MD 03/10/2018, 8:47 AM

## 2018-03-11 LAB — BASIC METABOLIC PANEL
Anion gap: 12 (ref 5–15)
BUN: 22 mg/dL — ABNORMAL HIGH (ref 6–20)
CO2: 25 mmol/L (ref 22–32)
Calcium: 8.2 mg/dL — ABNORMAL LOW (ref 8.9–10.3)
Chloride: 100 mmol/L (ref 98–111)
Creatinine, Ser: 5.22 mg/dL — ABNORMAL HIGH (ref 0.61–1.24)
GFR calc Af Amer: 13 mL/min — ABNORMAL LOW (ref >60)
GFR calc non Af Amer: 11 mL/min — ABNORMAL LOW (ref >60)
Glucose, Bld: 117 mg/dL — ABNORMAL HIGH (ref 70–99)
Potassium: 3.7 mmol/L (ref 3.5–5.1)
Sodium: 137 mmol/L (ref 135–145)

## 2018-03-11 LAB — HEPARIN LEVEL (UNFRACTIONATED): Heparin Unfractionated: 0.42 IU/mL (ref 0.30–0.70)

## 2018-03-11 LAB — LACTATE DEHYDROGENASE: LDH: 210 U/L — ABNORMAL HIGH (ref 98–192)

## 2018-03-11 LAB — GLUCOSE, CAPILLARY
GLUCOSE-CAPILLARY: 131 mg/dL — AB (ref 70–99)
Glucose-Capillary: 107 mg/dL — ABNORMAL HIGH (ref 70–99)
Glucose-Capillary: 123 mg/dL — ABNORMAL HIGH (ref 70–99)
Glucose-Capillary: 92 mg/dL (ref 70–99)

## 2018-03-11 LAB — PROTIME-INR
INR: 1.96
PROTHROMBIN TIME: 22.2 s — AB (ref 11.4–15.2)

## 2018-03-11 MED ORDER — METOCLOPRAMIDE HCL 5 MG/ML IJ SOLN
5.0000 mg | Freq: Four times a day (QID) | INTRAMUSCULAR | Status: DC
  Administered 2018-03-11 – 2018-03-19 (×31): 5 mg via INTRAVENOUS
  Filled 2018-03-11 (×30): qty 2

## 2018-03-11 MED ORDER — DRONABINOL 2.5 MG PO CAPS
2.5000 mg | ORAL_CAPSULE | Freq: Two times a day (BID) | ORAL | Status: DC
  Administered 2018-03-11 – 2018-03-19 (×17): 2.5 mg via ORAL
  Filled 2018-03-11 (×17): qty 1

## 2018-03-11 MED ORDER — WARFARIN SODIUM 4 MG PO TABS
4.0000 mg | ORAL_TABLET | Freq: Once | ORAL | Status: AC
  Administered 2018-03-11: 4 mg via ORAL
  Filled 2018-03-11: qty 1

## 2018-03-11 MED ORDER — ISOSORB DINITRATE-HYDRALAZINE 20-37.5 MG PO TABS
1.0000 | ORAL_TABLET | Freq: Three times a day (TID) | ORAL | Status: DC
  Administered 2018-03-11 – 2018-03-12 (×6): 1 via ORAL
  Filled 2018-03-11 (×7): qty 1

## 2018-03-11 NOTE — Plan of Care (Signed)
Continue current care plan 

## 2018-03-11 NOTE — Progress Notes (Signed)
Patient ID: [name], male   DOB: 03-02-1966, 52 y.o.   MRN: [id number]  [facility] Progress Note   Assessment/ Plan:   1.  Status post left ventricular assist device pump thrombosis: Status post LVAD pump removal/exchange with redo sternotomy on 02/28/2018.  Continuing wound care per thoracic surgery recommendations with continued LVAD monitoring/modifications by heart failure service. 2. End-stage renal disease: Continue hemodialysis on a Monday/Wednesday/Friday schedule via his right upper arm AV graft.  No acute indications for dialysis today, will order for dialysis tomorrow. 3. Anemia: Hemoglobin/hematocrit stable, continue ESA 4. CKD-MBD: Calcium and phosphorus levels within acceptable range when last checked, will check again with labs tomorrow to decide on need for adjustment of therapy. 5. Nutrition: Continue renal diet, renal multivitamin and supplementation with Glucerna.  Albumin 2.4 6.  Nausea/vomiting: Secondary to gastroparesis, symptomatic management and changes in antihypertensive therapy choices noted.  Improved at this time.  Subjective:   No acute events overnight, denies any focal pain/discomfort.  Objective:   BP (!) 126/93 (BP Location: Left Arm)   Pulse (!) 105   Temp 98.4 F (36.9 C) (Oral)   Resp 14   Ht 5' 8"  (1.727 m)   Wt 81.7 kg (180 lb 1.9 oz)   SpO2 100%   BMI 27.39 kg/m   Physical Exam: Gen: Resting comfortably in bed.  Hematoma over right neck noted (previous CVL site). CVS: Pulse regular rhythm and normal rate, LVAD hum audible over precordium Resp: Anteriorly clear to auscultation, no rales/rhonchi Abd: Soft, flat, nontender Ext: Trace lower extremity edema, RUA AVG being cannulated  Labs: BMET Recent Labs  Lab 03/05/18 0456 03/05/18 2048 03/06/18 0349 03/06/18 1632 03/07/18 0401 03/07/18 1600 03/08/18 0506 03/09/18 0707 03/10/18 0850 03/11/18 0400  NA 135  137 137 135 137 132* 139 134* 137 136 137  K 4.7   3.9 4.2 3.9 4.1 4.2 4.0 4.1 3.9 3.9 3.7  CL 100  102 102 102 103 100 100 101 103 97* 100  CO2 25  27 25 26 25 23 30 27 27 26 25   GLUCOSE 118*  117* 143* 124* 142* 119* 175* 165* 196* 167* 117*  BUN 6  6 8 12 15 19 8 15  26* 16 22*  CREATININE 2.08*  2.06* 2.76* 3.29* 4.07* 4.80* 2.57* 3.76* 5.48* 4.03* 5.22*  CALCIUM 8.0*  8.1* 8.2* 7.7* 7.7* 7.6* 7.7* 7.6* 7.7* 8.4* 8.2*  PHOS 1.2* 3.6 3.4 3.1 3.3 2.0* 2.6  --   --   --    CBC Recent Labs  Lab 03/06/18 0349 03/07/18 0400 03/08/18 0506 03/09/18 0346 03/09/18 1532  WBC 9.0 11.5* 10.0 11.4* 14.8*  NEUTROABS 6.5 8.1* 6.7 7.9*  --   HGB 9.1* 9.2* 8.9* 9.1* 10.0*  HCT 28.2* 28.7* 27.7* 28.5* 31.2*  MCV 90.7 91.1 91.7 92.5 91.0  PLT 123* 159 204 274 300    Medications:    . aspirin EC  81 mg Oral Daily  . atorvastatin  40 mg Oral q1800  . bisacodyl  10 mg Oral Daily   Or  . bisacodyl  10 mg Rectal Daily  . Chlorhexidine Gluconate Cloth  6 each Topical Daily  . citalopram  20 mg Oral Daily  . clonazePAM  1 mg Oral QHS  . docusate sodium  200 mg Oral Daily  . erythromycin  250 mg Oral TID  . feeding supplement (GLUCERNA SHAKE)  237 mL Oral TID BM  . hydrALAZINE  25 mg Oral Q8H  .  insulin aspart  0-5 Units Subcutaneous QHS  . insulin aspart  0-9 Units Subcutaneous TID WC  . mouth rinse  15 mL Mouth Rinse BID  . metoCLOPramide (REGLAN) injection  10 mg Intravenous Q6H  . pantoprazole  40 mg Oral BID  . sodium chloride flush  10-40 mL Intracatheter Q12H  . sodium chloride flush  3 mL Intravenous Q12H  . Warfarin - Pharmacist Dosing Inpatient   Does not apply [id number]   Elmarie Shiley, MD 03/11/2018, 7:49 AM

## 2018-03-11 NOTE — Progress Notes (Signed)
ANTICOAGULATION CONSULT NOTE - Follow Up  Consult  Pharmacy Consult for warfarin, heparin Indication: LVAD  Allergies  Allergen Reactions  . Metformin And Related Diarrhea    Patient Measurements: Height: 5' 8"  (172.7 cm) Weight: 180 lb 1.9 oz (81.7 kg) IBW/kg (Calculated) : 68.4  Vital Signs: Temp: 98.4 F (36.9 C) (07/07 0319) Temp Source: Oral (07/07 0319) BP: 126/93 (07/07 0408) Pulse Rate: 105 (07/07 0319)  Labs: Recent Labs    03/09/18 0346 03/09/18 0707 03/09/18 1532 03/10/18 0850 03/10/18 1825 03/11/18 0400  HGB 9.1*  --  10.0*  --   --   --   HCT 28.5*  --  31.2*  --   --   --   PLT 274  --  300  --   --   --   LABPROT 17.8*  --   --  17.7*  --  22.2*  INR 1.48  --   --  1.47  --  1.96  HEPARINUNFRC 0.23*  --   --  0.20* 0.39 0.42  CREATININE  --  5.48*  --  4.03*  --  5.22*    Estimated Creatinine Clearance: 15.8 mL/min (A) (by C-G formula based on SCr of 5.22 mg/dL (H)).   Medical History: Past Medical History:  Diagnosis Date  . Angina   . ASCVD (arteriosclerotic cardiovascular disease)    , Anterior infarction 2005, LAD diagonal bifurcation intervention 03/2004  . Automatic implantable cardiac defibrillator -[employer]       . Benign neoplasm of colon   . CHF (congestive heart failure) ([place])   . Chronic systolic heart failure ([place])   . Coronary artery disease     Widely patent previously placed stents in the left anterior   . Crohn's disease ([place])   . 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 Dialysis patient ([place])   . Dyspnea   . Gastroparesis   . GERD (gastroesophageal reflux disease)   . High cholesterol   . Hyperlipidemia   . Hypersomnolent    Previous diagnosis of narcolepsy  . Hypertension, essential   . Ischemic cardiomyopathy    Ejection fraction 15-20% catheterization 2010  . Type II or unspecified type diabetes mellitus without mention of complication, not stated as uncontrolled   . Unspecified gastritis and  gastroduodenitis without mention of hemorrhage     Assessment: 64 yom on warfarin PTA for LVAD/factor V leiden who is now s/p LVAD change out for pump thrombus. Previous warfarin regimen for goal 2-3 is 5 mg daily except 2.5 mg on Friday.    INR on admission was 1.24.    Today INR has risen to 1.9, worsened gastroparesis noted yesterday. LDH stable in low 200s. No s/sx of bleeding. Transitioned back from CVVH to HD. Restarted on erythromycin, which can impact INR sensitivity, for gastroparesis. Heparin level at goal this am but with INR greater than 1.8 will d/c heparin for now. Will now need to back down on warfarin given INR trend.     Goal of Therapy:  HL 0.3-0.5 INR 2-3 Monitor platelets by anticoagulation protocol: Yes   Plan:  Will stop heparin this am Coumadin 4 mg x 1 tonight. Daily INR.  Erin Hearing PharmD., BCPS Clinical Pharmacist 03/11/2018 7:27 AM

## 2018-03-11 NOTE — Progress Notes (Signed)
Patient ID: [name], male   DOB: 1966/01/23, 52 y.o.   MRN: [id number]     Advanced Heart Failure Rounding Note  PCP-Cardiologist: No primary care provider on file.   Subjective:    CVVHD stopped 03/05/18.   Tolerating iHD well.   Amlodipine started for high MAPs but stopped with worsening gastroparesis symptoms.  Still some nausea this morning but better.  Has been up to the bathroom. Took his pills this am, Ativan seems to help nausea the most.   MAP remains elevated around 100.   LVAD Interrogation HM 3: Speed: 5400 Flow: 3.5 PI: 5.7 Power: 4.4. 3 PI events/24 hrs.  VAD interrogated personally. Parameters stable.   Objective:   Weight Range: 180 lb 1.9 oz (81.7 kg) Body mass index is 27.39 kg/m.   Vital Signs:   Temp:  [97.6 F (36.4 C)-98.8 F (37.1 C)] 98.7 F (37.1 C) (07/07 0812) Pulse Rate:  [95-106] 100 (07/07 0812) Resp:  [14-20] 14 (07/07 0812) BP: (113-146)/(76-101) 125/97 (07/07 0812) SpO2:  [96 %-100 %] 99 % (07/07 0812) Weight:  [180 lb 1.9 oz (81.7 kg)] 180 lb 1.9 oz (81.7 kg) (07/07 0552) Last BM Date: 03/09/18  MAPs 100s     Weight change: Filed Weights   03/09/18 1747 03/10/18 0614 03/11/18 0552  Weight: 182 lb 12.2 oz (82.9 kg) 182 lb 3.2 oz (82.6 kg) 180 lb 1.9 oz (81.7 kg)   Intake/Output:   Intake/Output Summary (Last 24 hours) at 03/11/2018 1003 Last data filed at 03/11/2018 0936 Gross per 24 hour  Intake 330.78 ml  Output 0 ml  Net 330.78 ml    Physical Exam   General: Well appearing this am. NAD.  HEENT: Normal. Neck: Supple, JVP 7-8 cm. Carotids OK.  Cardiac:  Mechanical heart sounds with LVAD hum present.  Lungs:  CTAB, normal effort.  Abdomen:  NT, ND, no HSM. No bruits or masses. +BS  LVAD exit site: Well-healed and incorporated. Dressing dry and intact. No erythema or drainage. Stabilization device present and accurately applied. Driveline dressing changed daily per sterile technique. Extremities:  Warm and dry. No  cyanosis, clubbing, rash, or edema.  Neuro:  Alert & oriented x 3. Cranial nerves grossly intact. Moves all 4 extremities w/o difficulty. Affect pleasant     Telemetry   ST 100s, personally reviewed.   Labs    CBC Recent Labs    03/09/18 0346 03/09/18 1532  WBC 11.4* 14.8*  NEUTROABS 7.9*  --   HGB 9.1* 10.0*  HCT 28.5* 31.2*  MCV 92.5 91.0  PLT 274 465   Basic Metabolic Panel Recent Labs    03/10/18 0850 03/11/18 0400  NA 136 137  K 3.9 3.7  CL 97* 100  CO2 26 25  GLUCOSE 167* 117*  BUN 16 22*  CREATININE 4.03* 5.22*  CALCIUM 8.4* 8.2*   Liver Function Tests No results for input(s): AST, ALT, ALKPHOS, BILITOT, PROT, ALBUMIN in the last 72 hours. No results for input(s): LIPASE, AMYLASE in the last 72 hours. Cardiac Enzymes No results for input(s): CKTOTAL, CKMB, CKMBINDEX, TROPONINI in the last 72 hours.  BNP: BNP (last 3 results) Recent Labs    07/03/17 0037 03/02/18 0400 03/08/18 0008  BNP 758.9* 694.1* 1,576.6*    ProBNP (last 3 results) No results for input(s): PROBNP in the last 8760 hours.   D-Dimer No results for input(s): DDIMER in the last 72 hours. Hemoglobin A1C No results for input(s): HGBA1C in the last 72 hours. Fasting  Lipid Panel No results for input(s): CHOL, HDL, LDLCALC, TRIG, CHOLHDL, LDLDIRECT in the last 72 hours. Thyroid Function Tests No results for input(s): TSH, T4TOTAL, T3FREE, THYROIDAB in the last 72 hours.  Invalid input(s): FREET3  Other results:   Imaging    No results found.   Medications:     Scheduled Medications: . aspirin EC  81 mg Oral Daily  . atorvastatin  40 mg Oral q1800  . bisacodyl  10 mg Oral Daily   Or  . bisacodyl  10 mg Rectal Daily  . Chlorhexidine Gluconate Cloth  6 each Topical Daily  . citalopram  20 mg Oral Daily  . clonazePAM  1 mg Oral QHS  . docusate sodium  200 mg Oral Daily  . dronabinol  2.5 mg Oral BID AC  . erythromycin  250 mg Oral TID  . feeding supplement  (GLUCERNA SHAKE)  237 mL Oral TID BM  . insulin aspart  0-5 Units Subcutaneous QHS  . insulin aspart  0-9 Units Subcutaneous TID WC  . isosorbide-hydrALAZINE  1 tablet Oral TID  . mouth rinse  15 mL Mouth Rinse BID  . metoCLOPramide (REGLAN) injection  5 mg Intravenous Q6H  . pantoprazole  40 mg Oral BID  . sodium chloride flush  10-40 mL Intracatheter Q12H  . sodium chloride flush  3 mL Intravenous Q12H  . warfarin  4 mg Oral ONCE-1800  . Warfarin - Pharmacist Dosing Inpatient   Does not apply q1800    Infusions: . sodium chloride    . 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 Post VAD course complicated by ESRD and severe DM gastroparesis. Admitted with low/no-flow alarms due to LVAD outflow graft kink (does not have bend clip).   Assessment/Plan   1. Cardiogenic shock due to LVAD complication with LVAD outflow graft thrombosis: s/p emergent LVAD pump exchange 6/27 for pump thrombosis in setting of LVAD graft kink (did not have bend clip).  Volume looks ok.  - Control volume with HD.  - Off milrinone, stable.  2. VAD: s/p pump exchange 2/2 HM3 graft kink.  Has HM3.  MAP elevated in 100s.  - VAD interrogated personally. Parameters stable. - Can stop heparin gtt with INR 1.96 on warfarin.  Continue ASA 81.  - Start Bidil 1 tab tid, take on non-HD days.  3. Intractable Nausea/Vomiting secondary to gastroparesis: Amlodipine stopped.  Better today though still some nausea.  Took pills but has not eaten.  - Continue reglan, phenergan, and ativan as needed - Add back home Marinol.  - Continue erythromycin 250 mg TID a day for gastroparesis  He has been seen by [name] on 01/24/18 [facility]  gastroparesis specialist) 4. ESRD: CVVHD stopped 03/05/18. Tolerating iHD now, gets MWF.  - No change to current plan.   5. DM,insulin dependent - SSI for now.  6. Acute respiratory failure: Extubated 6/28  Stable on [place].  - Up to chair and mobilize. As tolerated.  - No change to current plan.   7. HTN: MAPs very elevated - Start Bidil as above.   I reviewed the LVAD parameters from today, and compared the results to the patient's prior recorded data.  No programming changes were made.  The LVAD is functioning within specified parameters.  The patient performs LVAD self-test daily.  LVAD interrogation was negative for any significant power changes, alarms or PI events/speed drops.  LVAD equipment check completed and is in good working order.  Back-up equipment present.   LVAD education done on emergency procedures and precautions and reviewed exit site care.   Length of Stay: 56  Loralie Champagne, MD  03/11/2018, 10:03 AM  Advanced Heart Failure Team Pager [phone] (M-F; 7a - 4p)  Please contact [facility] for night-coverage after hours (4p -7a ) and weekends on [email or web address]

## 2018-03-11 NOTE — Progress Notes (Signed)
   03/11/18 1226  Vitals  BP (!) 144/100  MAP (mmHg) 114  Pulse Rate (!) 106  ECG Heart Rate (!) 105  Oxygen Therapy  SpO2 92 %  hydralazine 8m iv given

## 2018-03-12 ENCOUNTER — Ambulatory Visit (HOSPITAL_COMMUNITY): Payer: Self-pay

## 2018-03-12 DIAGNOSIS — T829XXD Unspecified complication of cardiac and vascular prosthetic device, implant and graft, subsequent encounter: Secondary | ICD-10-CM

## 2018-03-12 LAB — BASIC METABOLIC PANEL
Anion gap: 10 (ref 5–15)
BUN: 28 mg/dL — ABNORMAL HIGH (ref 6–20)
CO2: 28 mmol/L (ref 22–32)
Calcium: 8.2 mg/dL — ABNORMAL LOW (ref 8.9–10.3)
Chloride: 102 mmol/L (ref 98–111)
Creatinine, Ser: 6.8 mg/dL — ABNORMAL HIGH (ref 0.61–1.24)
GFR calc Af Amer: 10 mL/min — ABNORMAL LOW (ref >60)
GFR calc non Af Amer: 8 mL/min — ABNORMAL LOW (ref >60)
Glucose, Bld: 116 mg/dL — ABNORMAL HIGH (ref 70–99)
Potassium: 3.7 mmol/L (ref 3.5–5.1)
Sodium: 140 mmol/L (ref 135–145)

## 2018-03-12 LAB — GLUCOSE, CAPILLARY
GLUCOSE-CAPILLARY: 108 mg/dL — AB (ref 70–99)
GLUCOSE-CAPILLARY: 91 mg/dL (ref 70–99)
Glucose-Capillary: 104 mg/dL — ABNORMAL HIGH (ref 70–99)
Glucose-Capillary: 107 mg/dL — ABNORMAL HIGH (ref 70–99)

## 2018-03-12 LAB — CBC WITH DIFFERENTIAL/PLATELET
ABS IMMATURE GRANULOCYTES: 0.1 10*3/uL (ref 0.0–0.1)
Basophils Absolute: 0 10*3/uL (ref 0.0–0.1)
Basophils Relative: 0 %
EOS ABS: 0.1 10*3/uL (ref 0.0–0.7)
Eosinophils Relative: 0 %
HEMATOCRIT: 27.2 % — AB (ref 39.0–52.0)
Hemoglobin: 8.6 g/dL — ABNORMAL LOW (ref 13.0–17.0)
Immature Granulocytes: 1 %
LYMPHS ABS: 0.7 10*3/uL (ref 0.7–4.0)
Lymphocytes Relative: 5 %
MCH: 29 pg (ref 26.0–34.0)
MCHC: 31.6 g/dL (ref 30.0–36.0)
MCV: 91.6 fL (ref 78.0–100.0)
MONOS PCT: 6 %
Monocytes Absolute: 1 10*3/uL (ref 0.1–1.0)
Neutro Abs: 14.1 10*3/uL — ABNORMAL HIGH (ref 1.7–7.7)
Neutrophils Relative %: 88 %
Platelets: 427 10*3/uL — ABNORMAL HIGH (ref 150–400)
RBC: 2.97 MIL/uL — ABNORMAL LOW (ref 4.22–5.81)
RDW: 16.1 % — ABNORMAL HIGH (ref 11.5–15.5)
WBC: 16 10*3/uL — ABNORMAL HIGH (ref 4.0–10.5)

## 2018-03-12 LAB — PROTIME-INR
INR: 2.83
Prothrombin Time: 29.5 seconds — ABNORMAL HIGH (ref 11.4–15.2)

## 2018-03-12 LAB — LACTATE DEHYDROGENASE: LDH: 201 U/L — AB (ref 98–192)

## 2018-03-12 MED ORDER — PROMETHAZINE HCL 25 MG/ML IJ SOLN: 12.5000 mg | Freq: Two times a day (BID) | INTRAMUSCULAR | Status: DC | PRN

## 2018-03-12 MED ORDER — WARFARIN SODIUM 2 MG PO TABS
2.0000 mg | ORAL_TABLET | Freq: Once | ORAL | Status: AC
  Administered 2018-03-12: 2 mg via ORAL
  Filled 2018-03-12: qty 1

## 2018-03-12 MED ORDER — DARBEPOETIN ALFA 40 MCG/0.4ML IJ SOSY
40.0000 ug | PREFILLED_SYRINGE | INTRAMUSCULAR | Status: DC
  Administered 2018-03-12: 40 ug via INTRAVENOUS
  Filled 2018-03-12: qty 0.4

## 2018-03-12 NOTE — Progress Notes (Signed)
LVAD Coordinator Rounding Note:  Admitted 02/28/18 from the ED for 0 Flow from ED. Pt was taken emergently to the OR for outflow graft twist/obstruction.  HM III LVAD implanted on 06/12/17 by Dr. Prescott Gum under Destination Therapy criteria due to renal insufficiency. Pump exchange on 03/01/18 for outflow graft twist/obstruction.   Pt asleep, in bed having dialysis treatment.  Vital signs: Temp: 98.8 HR:  106 Doppler Pressure:  148 Auto BP:  148/103 (116) O2 Sat: 96% on RA Wt: 176>166>193>180>185>184>190>189>187>188>176 lbs  LVAD interrogation reveals:  Speed: 5300 Flow: 3.8 Power: 4.1w PI: 4.0 Alarms: none  Events:  5-10 Hematocrit: 27 Fixed speed: 5200 Low speed limit: 4900  Drive Line: Gauze dressing dry and intact; anchor intact and accurately applied.Drive line anchors intactx 2. [name] Patten, bedside nurse will change dressing today.  Labs:  LDH trend: 384>253>241>304>217>207>197>206>201  INR trend: 1.70>3.07>4.13>2.37>1.95>1.68>1.54>1.48>2.83  Blood Products: Intra-op: 03/01/18 - 5 PRBCs     2 PLT     2 FFP     2 Cryo  Gtts: Milrinone 0.125 mcg/kg/min (stopped 03/07/18) Levo 12 mcg/min (stopped 03/04/18) Neo 25 mcg/min (stopped 03/03/18) Epi 1 mcg/min (stopped 03/03/18)  Anticoagulation Plan: -INR Goal: 2.0 - 3.0 -ASA Dose: 81 mg daily   Device: -St Jude dual  -Therapies: currently off - Monitor: on VT 150 bpm  Adverse Events on VAD: - ESRD post LVAD; started CVVH 06/15/17; HD started 06/20/17 - Hospitalization 11/5 - 07/16/17 > gastroparesis diagnosis after EGD - Hospitalization 11/26 - 08/04/17 > gastroparesis - Hospitalization 1/8 - 09/16/17>gastroparesis - referred to Dr. Corliss Parish at [facility] - 10/31/17>rightupper arm arteriovenous graft with Artegraft; ligation of right first stage brachial vein transposition  - 12/11/13 elective admission for thrombectomy for AV graft in [facility]  - 12/20/17 > intractable N/V - 01/19/18 > intractable N/V sent by EMS from dialysis  center - 03/01/18 > outflow graft twist/occulsion requiring pump exchange   Plan/Recommendations:  1. Dressing change with old DL site packed daily by VAD Coordinator, Nurse Davonna Belling, or trained caregiver.  2. Please page VAD coordinator with any equipment questions or concerns. 3. Please mobilize pt.  [name] RN, VAD Coordinator 24/7 VAD Pager: [phone]

## 2018-03-12 NOTE — Progress Notes (Signed)
Pt getting HD this am and now with PT this pm. Will f/u tomorrow. Yves Dill CES, ACSM 2:10 PM 03/12/2018

## 2018-03-12 NOTE — Progress Notes (Signed)
Inpatient Rehabilitation Admissions Coordinator  Physical therapy has changed recommendation today to CIR. If you would like pt considered for admit, please place rehab consult.  Danne Baxter, RN, MSN Rehab Admissions Coordinator [phone] 03/12/2018 4:39 PM

## 2018-03-12 NOTE — Progress Notes (Addendum)
Patient ID: [name], male   DOB: [birth year], 52 y.o.   MRN: [id number]     Advanced Heart Failure Rounding Note  PCP-Cardiologist: No primary care provider on file.   Subjective:    CVVHD stopped 03/05/18.   Tolerating iHD well (MWF). ICD turned back on today. He had low HVL1. Per rep, may be due to inflammation from surgery.    Requiring frequent IV hydralazine for elevated MAPs. Started on bidil yesterday. MAPs 140s, but I think this may be modified systolic for him.   Drowsy this morning. Denies SOB, bleeding, dizziness, or headaches. Nausea okay this morning.   WBC trending up. 16.0 this am. Tmax 99.1. Denies cough, fever, chills.   Hemoglobin 10.0 (7/5) > 8.6 this am, LDH 201, INR 2.83. Denies bleeding.   LVAD Interrogation HM 3: Speed: 5400 Flow: 3.4 PI: 6.6 Power: 4 12 PI events/24 hrs. VAD interrogated personally. Parameters stable.   Objective:   Weight Range: 180 lb 12.8 oz (82 kg) Body mass index is 27.49 kg/m.   Vital Signs:   Temp:  [98.3 F (36.8 C)-98.9 F (37.2 C)] 98.8 F (37.1 C) (07/08 0400) Pulse Rate:  [100-113] 103 (07/08 0400) Resp:  [13-16] 16 (07/07 1715) BP: (92-150)/(70-100) 137/96 (07/08 0400) SpO2:  [91 %-99 %] 95 % (07/08 0400) Weight:  [180 lb 12.8 oz (82 kg)] 180 lb 12.8 oz (82 kg) (07/08 0500) Last BM Date: 03/11/18  MAPs 140s   Weight change: Filed Weights   03/10/18 0614 03/11/18 0552 03/12/18 0500  Weight: 182 lb 3.2 oz (82.6 kg) 180 lb 1.9 oz (81.7 kg) 180 lb 12.8 oz (82 kg)   Intake/Output:   Intake/Output Summary (Last 24 hours) at 03/12/2018 0749 Last data filed at 03/12/2018 0000 Gross per 24 hour  Intake 330 ml  Output -  Net 330 ml    Physical Exam   General:  NAD.  HEENT: Normal. Neck: Supple, JVP 7-8 cm. Carotids OK. Left IJ TLC Cardiac:  Mechanical heart sounds with LVAD hum present.  Lungs:  CTAB, normal effort.  Abdomen:  NT, ND, no HSM. No bruits or masses. +BS  LVAD exit site: Dressing dry and  intact. Stabilization device present and accurately applied.  Extremities:  Warm and dry. No cyanosis, clubbing, rash, or edema. Warm to touch.  Neuro:  Alert & oriented x 3. Cranial nerves grossly intact. Moves all 4 extremities w/o difficulty. Affect pleasant     Telemetry   Sinus tach 100-110s. personally reviewed.   Labs    CBC Recent Labs    03/09/18 1532 03/12/18 0459  WBC 14.8* 16.0*  NEUTROABS  --  14.1*  HGB 10.0* 8.6*  HCT 31.2* 27.2*  MCV 91.0 91.6  PLT 300 782*   Basic Metabolic Panel Recent Labs    03/11/18 0400 03/12/18 0459  NA 137 140  K 3.7 3.7  CL 100 102  CO2 25 28  GLUCOSE 117* 116*  BUN 22* 28*  CREATININE 5.22* 6.80*  CALCIUM 8.2* 8.2*   Liver Function Tests No results for input(s): AST, ALT, ALKPHOS, BILITOT, PROT, ALBUMIN in the last 72 hours. No results for input(s): LIPASE, AMYLASE in the last 72 hours. Cardiac Enzymes No results for input(s): CKTOTAL, CKMB, CKMBINDEX, TROPONINI in the last 72 hours.  BNP: BNP (last 3 results) Recent Labs    07/03/17 0037 03/02/18 0400 03/08/18 0008  BNP 758.9* 694.1* 1,576.6*    ProBNP (last 3 results) No results for input(s): PROBNP in the last  8760 hours.   D-Dimer No results for input(s): DDIMER in the last 72 hours. Hemoglobin A1C No results for input(s): HGBA1C in the last 72 hours. Fasting Lipid Panel No results for input(s): CHOL, HDL, LDLCALC, TRIG, CHOLHDL, LDLDIRECT in the last 72 hours. Thyroid Function Tests No results for input(s): TSH, T4TOTAL, T3FREE, THYROIDAB in the last 72 hours.  Invalid input(s): FREET3  Other results:   Imaging    No results found.   Medications:     Scheduled Medications: . aspirin EC  81 mg Oral Daily  . atorvastatin  40 mg Oral q1800  . bisacodyl  10 mg Oral Daily   Or  . bisacodyl  10 mg Rectal Daily  . Chlorhexidine Gluconate Cloth  6 each Topical Daily  . citalopram  20 mg Oral Daily  . clonazePAM  1 mg Oral QHS  . docusate  sodium  200 mg Oral Daily  . dronabinol  2.5 mg Oral BID AC  . erythromycin  250 mg Oral TID  . feeding supplement (GLUCERNA SHAKE)  237 mL Oral TID BM  . insulin aspart  0-5 Units Subcutaneous QHS  . insulin aspart  0-9 Units Subcutaneous TID WC  . isosorbide-hydrALAZINE  1 tablet Oral TID  . mouth rinse  15 mL Mouth Rinse BID  . metoCLOPramide (REGLAN) injection  5 mg Intravenous Q6H  . pantoprazole  40 mg Oral BID  . sodium chloride flush  10-40 mL Intracatheter Q12H  . sodium chloride flush  3 mL Intravenous Q12H  . Warfarin - Pharmacist Dosing Inpatient   Does not apply q1800    Infusions: . sodium chloride    . 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 Post VAD course complicated by ESRD and severe DM gastroparesis. Admitted with low/no-flow alarms due to LVAD outflow graft kink (does not have bend clip).   Assessment/Plan   1. Cardiogenic shock due to LVAD complication with LVAD outflow graft thrombosis: s/p emergent LVAD pump exchange 6/27 for pump thrombosis in setting of LVAD graft kink (did not have bend clip).  Volume looks okay.   - Control volume with HD.  - Off milrinone, stable. No change.  2. VAD: s/p pump exchange 2/2 HM3 graft kink.  Has HM3.  MAP elevated in 100s.  - VAD interrogated personally. Parameters stable. - Continue coumadin per pharmacy.  Continue ASA 81. INR 2.83 - Continue Bidil 1 tab tid, take on non-HD days.  3. Intractable Nausea/Vomiting secondary to gastroparesis: Amlodipine stopped.  Better today though still some nausea.  Took pills but has not eaten.  - Continue reglan, phenergan, and ativan as needed - Continue home Marinol.  - Continue  erythromycin 250 mg TID a day for gastroparesis  He has been seen by [name] on 01/24/18 [facility] gastroparesis specialist) 4. ESRD: CVVHD stopped 03/05/18. Tolerating iHD now, gets MWF.  - Getting HD now.  5. DM,insulin dependent - SSI for now. No change.  6. Acute respiratory failure: Extubated 6/28  Stable on [place].  - Up to chair and mobilize. As tolerated.  - PT ordered.  7. HTN: MAPs elevated - I think his doppler MAP may be a modified systolic. I got doppler MAP 146 and cuff  SBP 144.  - Continue Bidil. - Amlodipine caused gastroparesis symptoms.  - Reassess BP this afternoon after HD.  8. Leukocytosis - WBC trending up. 16.0 this am. Tmax 99.1. He feels warm, but denies fever/chills.  - Will send blood cultures.  9. Anemia: No overt bleeding.  Follow closely.   I reviewed the LVAD parameters from today, and compared the results to the patient's prior recorded data.  No programming changes were made.  The LVAD is functioning within specified parameters.  The patient performs LVAD self-test daily.  LVAD interrogation was negative for any significant power changes, alarms or PI events/speed drops.  LVAD equipment check completed and is in good working order.  Back-up equipment present.   LVAD education done on emergency procedures and precautions and reviewed exit site care.   Length of Stay: [place], NP  03/12/2018, 7:49 AM  Advanced Heart Failure Team Pager [phone] (M-F; 7a - 4p)  Please contact [facility] for night-coverage after hours (4p -7a ) and weekends on [email or web address]  Patient seen with NP, agree with the above note.  Getting HD currently.  Nausea slowly improving, taking pills now but not eating much.  Tm 99.1, WBCs higher.  Hemoglobin lower but no overt bleeding. MAP still elevated, taking Bidil now.   Follow MAP for now, hopefully better post-HD.  Continue Bidil.    Will send blood cultures with rising WBCs.   Needs to mobilize out of bed, discussed with him  today.   [name] 03/12/2018 9:48 AM

## 2018-03-12 NOTE — Plan of Care (Signed)
Continue current care plan 

## 2018-03-12 NOTE — Progress Notes (Signed)
PT Cancellation Note  Patient Details Name: [name] MRN: [id number] DOB: 06-30-1966   Cancelled Treatment:    Reason Eval/Treat Not Completed: Patient at procedure or test/unavailable(currently in HD)   [place] 03/12/2018, 10:40 AM  [name], [place]

## 2018-03-12 NOTE — Progress Notes (Signed)
Physical Therapy Treatment Patient Details Name: [name] MRN: [id number] DOB: 1966/05/24 Today's Date: 03/12/2018    History of Present Illness Pt is a 52 y.o. male with h/o LVAD placement, admitted to ED due to low flow alarm. CT of chest showed twisted outlfow graft with possible thrombus within the graft.  He was taken emergently to OR and underwent VAD exchange. Pt requiring CRRT during hospitalization, changed to HD on 03/07/18.   PMH includes ESRD on HD, DM, ischemic cardiomyopathy, HTN, gastroparesis, DVT, Chron's disease, AICD.    PT Comments    Pt reports intense nausea, fatigue and overall feeling poorly today. Pt willing to attempt mobility but would not transition power to battery or don vest and required max assist to perform these today due to fatigue. Pt able to get to EOB and stand with increased assistance. Pt willing to pivot to chair with encouragement but unable to attempting stepping or ambulation today and declined HEP. Pt with HR 114, SpO2 95% on RA. Pt s/p HD and just finished dressing change and reports increased fatigue today compared to normal post HD days. Pt encouraged to mobilize and aware of need and benefit. Will continue to follow.    Follow Up Recommendations  CIR;Supervision/Assistance - 24 hour     Equipment Recommendations  None recommended by PT    Recommendations for Other Services       Precautions / Restrictions Precautions Precautions: Sternal;Fall Precaution Comments: LVAD    Mobility  Bed Mobility Overal bed mobility: Needs Assistance Bed Mobility: Supine to Sit     Supine to sit: Min assist;HOB elevated     General bed mobility comments: HOB 35 degrees, cues for sequence with increased time and min assist to elevate trunk  Transfers Overall transfer level: Needs assistance   Transfers: Sit to/from Stand;Stand Pivot Transfers Sit to Stand: Mod assist Stand pivot transfers: Min assist       General transfer comment:  mod assist to stand from bed x 2 trials with cues and assist for rise and anterior translation with hands on knees. On 2nd trial pt holding Rw and able to pivot to chair min assist with encouragement and cues  Ambulation/Gait             General Gait Details: pt declined attempting stating fatigue and nausea   Stairs             Wheelchair Mobility    Modified Rankin (Stroke Patients Only)       Balance Overall balance assessment: Needs assistance   Sitting balance-[name]: Fair       Standing balance-[name]: Poor Standing balance comment: UE support on RW                            Cognition Arousal/Alertness: Awake/alert Behavior During Therapy: Flat affect Overall Cognitive Status: Impaired/Different from baseline Area of Impairment: Memory                     Memory: Decreased recall of precautions         General Comments: pt unable to state sternal precautions      Exercises      General Comments        Pertinent Vitals/Pain Pain Assessment: No/denies pain    Home Living                      Prior Function  PT Goals (current goals can now be found in the care plan section) Progress towards PT goals: Not progressing toward goals - comment    Frequency           PT Plan Discharge plan needs to be updated    Co-evaluation              AM-PAC PT "6 Clicks" Daily Activity  Outcome Measure  Difficulty turning over in bed (including adjusting bedclothes, sheets and blankets)?: A Little Difficulty moving from lying on back to sitting on the side of the bed? : A Lot Difficulty sitting down on and standing up from a chair with arms (e.g., wheelchair, bedside commode, etc,.)?: Unable Help needed moving to and from a bed to chair (including a wheelchair)?: A Lot Help needed walking in hospital room?: Total Help needed climbing 3-5 steps with a railing? : Total 6 Click Score:  10    End of Session   Activity Tolerance: Patient limited by fatigue Patient left: in chair;with call bell/phone within [name] Nurse Communication: Mobility status PT Visit Diagnosis: Other abnormalities of gait and mobility (R26.89);Muscle weakness (generalized) (M62.81);Difficulty in walking, not elsewhere classified (R26.2)     Time: 8616-8372 PT Time Calculation (min) (ACUTE ONLY): 20 min  Charges:  $Therapeutic Activity: 8-22 mins                    G Codes:       [name], PT [phone]    [name] 03/12/2018, 2:19 PM

## 2018-03-12 NOTE — Progress Notes (Signed)
Patient woke up from nap which started around 1830 and he stated he thought he could take his PO meds. Medications due at 2200 were given to patient and he asked for another dose of Ativan so he could keep medications down. Map at 0000 read 144, so Hydralazine 87m was given IV. Will continue to monitor closely.

## 2018-03-12 NOTE — Progress Notes (Signed)
ANTICOAGULATION CONSULT NOTE - Follow Up  Consult  Pharmacy Consult for warfarin, heparin Indication: LVAD  Allergies  Allergen Reactions  . Metformin And Related Diarrhea    Patient Measurements: Height: 5' 8"  (172.7 cm) Weight: 180 lb 12.8 oz (82 kg) IBW/kg (Calculated) : 68.4  Vital Signs: Temp: 99.1 F (37.3 C) (07/08 0741) Temp Source: Oral (07/08 0741) BP: 133/97 (07/08 0741) Pulse Rate: 105 (07/08 0741)  Labs: Recent Labs    03/09/18 1532 03/10/18 0850 03/10/18 1825 03/11/18 0400 03/12/18 0459  HGB 10.0*  --   --   --  8.6*  HCT 31.2*  --   --   --  27.2*  PLT 300  --   --   --  427*  LABPROT  --  17.7*  --  22.2* 29.5*  INR  --  1.47  --  1.96 2.83  HEPARINUNFRC  --  0.20* 0.39 0.42  --   CREATININE  --  4.03*  --  5.22* 6.80*    Estimated Creatinine Clearance: 12.2 mL/min (A) (by C-G formula based on SCr of 6.8 mg/dL (H)).   Medical History: Past Medical History:  Diagnosis Date  . Angina   . ASCVD (arteriosclerotic cardiovascular disease)    , Anterior infarction 2005, LAD diagonal bifurcation intervention 03/2004  . Automatic implantable cardiac defibrillator -[employer]       . Benign neoplasm of colon   . CHF (congestive heart failure) ([place])   . Chronic systolic heart failure ([place])   . Coronary artery disease     Widely patent previously placed stents in the left anterior   . Crohn's disease ([place])   . 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 Dialysis patient ([place])   . Dyspnea   . Gastroparesis   . GERD (gastroesophageal reflux disease)   . High cholesterol   . Hyperlipidemia   . Hypersomnolent    Previous diagnosis of narcolepsy  . Hypertension, essential   . Ischemic cardiomyopathy    Ejection fraction 15-20% catheterization 2010  . Type II or unspecified type diabetes mellitus without mention of complication, not stated as uncontrolled   . Unspecified gastritis and gastroduodenitis without mention of hemorrhage      Assessment: 38 yom on warfarin PTA for LVAD/factor V leiden who is now s/p LVAD change out for pump thrombus. Previous warfarin regimen for goal 2-3 is 5 mg daily except 2.5 mg on Friday.    INR on admission was 1.24. Today INR jumped from 1.96 to 2.8 - received 2 days of increased doses. Hgb down to 8.6, plt 427, LDH 201. Worsened gastroparesis noted yesterday. No s/sx of bleeding.   Restarted on erythromycin, which can impact INR sensitivity, for gastroparesis.      Goal of Therapy:  INR 2-3 Monitor platelets by anticoagulation protocol: Yes   Plan:  Will order warfarin 2 mg tonight given INR increase Daily INR.  Doylene Canard, PharmD Clinical Pharmacist  Pager: [phone] Phone: [phone] 03/12/2018 8:03 AM

## 2018-03-12 NOTE — Progress Notes (Signed)
12 Days Post-Op Procedure(s) (LRB): TRANSESOPHAGEAL ECHOCARDIOGRAM (TEE) (N/A) REDO INSERTION OF IMPLANTABLE LEFT VENTRICULAR ASSIST DEVICE - HEARTMATE 3 (N/A) Subjective: Patient examined, medical record reviewed Patient had good day yesterday with 3 walks in the hallway but today patient feels poorly after dialysis with nausea and fatigue   surgical incisions healing, clean and dry.  Power cord exit site healing adequately.  Blood cultures taken and CT scan of chest-abdomen pending.   Objective: Vital signs in last 24 hours: Temp:  [98.8 F (37.1 C)-99.1 F (37.3 C)] 98.8 F (37.1 C) (07/08 1130) Pulse Rate:  [100-114] 114 (07/08 1415) Cardiac Rhythm: Sinus tachycardia (07/08 0802) Resp:  [15-16] 15 (07/08 0741) BP: (92-152)/(58-102) 140/90 (07/08 1130) SpO2:  [94 %-97 %] 95 % (07/08 1415) Weight:  [176 lb 5.9 oz (80 kg)-180 lb 12.8 oz (82 kg)] 176 lb 5.9 oz (80 kg) (07/08 1130)  Hemodynamic parameters for last 24 hours:    Intake/Output from previous day: 07/07 0701 - 07/08 0700 In: 330 [P.O.:330] Out: -  Intake/Output this shift: Total I/O In: -  Out: 1500 [Other:1500]       Exam    General- alert and comfortable.  Lying in bed.    Neck- no JVD, no cervical adenopathy palpable, no carotid bruit   Lungs- clear without rales, wheezes.  Sternal incision clean and dry   Cor- regular rate and rhythm, no murmur , gallop.  Normal VAD Hum  abdomen- soft, non-tender, bowel sounds diminished   Extremities - warm, non-tender, minimal edema   Neuro- oriented, appropriate, no focal weakness   Lab Results: Recent Labs    03/12/18 0459  WBC 16.0*  HGB 8.6*  HCT 27.2*  PLT 427*   BMET:  Recent Labs    03/11/18 0400 03/12/18 0459  NA 137 140  K 3.7 3.7  CL 100 102  CO2 25 28  GLUCOSE 117* 116*  BUN 22* 28*  CREATININE 5.22* 6.80*  CALCIUM 8.2* 8.2*    PT/INR:  Recent Labs    03/12/18 0459  LABPROT 29.5*  INR 2.83   ABG    Component Value Date/Time   PHART 7.326 (L) 03/02/2018 1635   HCO3 23.6 03/02/2018 1635   TCO2 25 03/02/2018 1635   ACIDBASEDEF 3.0 (H) 03/02/2018 1635   O2SAT 62.1 03/09/2018 0340   CBG (last 3)  Recent Labs    03/11/18 2145 03/12/18 0756 03/12/18 1247  GLUCAP 92 104* 107*    Assessment/Plan: S/P Procedure(s) (LRB): TRANSESOPHAGEAL ECHOCARDIOGRAM (TEE) (N/A) REDO INSERTION OF IMPLANTABLE LEFT VENTRICULAR ASSIST DEVICE - HEARTMATE 3 (N/A) Follow-up cultures and results of CT scan.  Hold on antibiotics without evidence of surgical infection.   LOS: 12 days    Tharon Aquas Trigt III 03/12/2018

## 2018-03-12 NOTE — Progress Notes (Signed)
Patient sitting on side of bed and asked when his next dose of Ativan was due. Patient also asked for Phenergan at the same time. Ativan was his preferred drug for his nausea. Hydralazine 36m given IV for MAP reading of 138. Will continue to monitor closely.

## 2018-03-12 NOTE — Progress Notes (Signed)
CSW attempted to visit patient at bedside although patient sleeping. CSW updated by VAD Coordinator on patient status. No family present at time of visit. CSW continues to follow. Raquel Sarna, [place], [place]

## 2018-03-12 NOTE — Progress Notes (Signed)
OT Cancellation Note  Patient Details Name: [name] MRN: [id number] DOB: [birth year]   Cancelled Treatment:    Reason Eval/Treat Not Completed: Other (comment). Pt currently have hemodialysis and per RN after that he is scheduled for CT. Will try back as schedule allows.  Almon Register [phone] 03/12/2018, 11:03 AM

## 2018-03-12 NOTE — Progress Notes (Signed)
[name] Progress Note  Subjective: +n/v, on HD  Vitals:   03/12/18 1015 03/12/18 1030 03/12/18 1045 03/12/18 1100  BP: (!) 138/94 (!) 138/100 140/75 (!) 142/95  Pulse:      Resp:      Temp:      TempSrc:      SpO2:      Weight:      Height:        Inpatient medications: . aspirin EC  81 mg Oral Daily  . atorvastatin  40 mg Oral q1800  . bisacodyl  10 mg Oral Daily   Or  . bisacodyl  10 mg Rectal Daily  . Chlorhexidine Gluconate Cloth  6 each Topical Daily  . citalopram  20 mg Oral Daily  . clonazePAM  1 mg Oral QHS  . docusate sodium  200 mg Oral Daily  . dronabinol  2.5 mg Oral BID AC  . erythromycin  250 mg Oral TID  . feeding supplement (GLUCERNA SHAKE)  237 mL Oral TID BM  . insulin aspart  0-5 Units Subcutaneous QHS  . insulin aspart  0-9 Units Subcutaneous TID WC  . isosorbide-hydrALAZINE  1 tablet Oral TID  . mouth rinse  15 mL Mouth Rinse BID  . metoCLOPramide (REGLAN) injection  5 mg Intravenous Q6H  . pantoprazole  40 mg Oral BID  . sodium chloride flush  10-40 mL Intracatheter Q12H  . sodium chloride flush  3 mL Intravenous Q12H  . warfarin  2 mg Oral ONCE-1800  . Warfarin - Pharmacist Dosing Inpatient   Does not apply q1800   . sodium chloride    . sodium chloride 10 mL/hr at 03/09/18 0800   hydrALAZINE, LORazepam, LORazepam, ondansetron (ZOFRAN) IV, oxyCODONE, promethazine, sodium chloride flush, sodium chloride flush, traMADol  Exam:  no distress  no jvd  chest cta bilat  cor lvad hum  abd soft ntnd no asictes  ext trace edema  RUA AVG minimal bruit   Home meds:  - norvasc 2.5/ hydralazine 37.20m tid  - ecasa/ lipitor/ sildenafil 20 tid/ coumadin 5 hs  - PPI/ reglan 10 tid/ ativan prn/ marinol prn  - lantus 10 u qd/ novolog 5 u tid  Dialysis: MWF [facility]  4h 159m 80kg 3K/2Ca  R AVG (minimal bruit due to lvad, but patent) Hep none  - calc 0.5 ug tiw  - mircera 50 q  2, last 6/26  - venofer 50 weekly         Impression: 1  LVAD thrombosis - sp pump exchange surgery 02/28/18 2  ESRD on HD mwf using R arm AVG (minimal bruit / thrill due to continuous flow seen w/ LVAD pts); on HD now 3  N/V - longterm problem, gastroparesis, per primary 4  Anemia ckd - Hb 8.5- 10 range, due for esa will give darbe 40 ug w/ HD today 5  MBD ckd - cont meds 6  Volume - is up 2kg, stable on exam  Plan - as above   RoKelly SplinterD CaSt. [facility] Hospitalid[name] pager [phone] 03/12/2018, 12:15 PM   Recent Labs  Lab 03/07/18 1600 03/08/18 0506  03/11/18 0400 03/12/18 0459  NA 139 134*   < > 137 140  K 4.0 4.1   < > 3.7 3.7  CL 100 101   < > 100 102  CO2 30 27   < > 25 28  GLUCOSE 175* 165*   < > 117* 116*  BUN 8 15   < >  22* 28*  CREATININE 2.57* 3.76*   < > 5.22* 6.80*  CALCIUM 7.7* 7.6*   < > 8.2* 8.2*  PHOS 2.0* 2.6  --   --   --   ALBUMIN 2.5* 2.4*  --   --   --   INR  --  1.54   < > 1.96 2.83   < > = values in this interval not displayed.   No results for input(s): AST, ALT, ALKPHOS, BILITOT, PROT in the last 168 hours. Recent Labs  Lab 03/09/18 0346 03/09/18 1532 03/12/18 0459  WBC 11.4* 14.8* 16.0*  NEUTROABS 7.9*  --  14.1*  HGB 9.1* 10.0* 8.6*  HCT 28.5* 31.2* 27.2*  MCV 92.5 91.0 91.6  PLT 274 300 427*   Iron/TIBC/Ferritin/ %Sat    Component Value Date/Time   IRON 40 (L) 11/02/2017 0016   TIBC 188 (L) 11/02/2017 0016   FERRITIN 523 (H) 11/02/2017 0016   IRONPCTSAT 21 11/02/2017 0016

## 2018-03-13 ENCOUNTER — Inpatient Hospital Stay (HOSPITAL_COMMUNITY): Payer: BLUE CROSS/BLUE SHIELD

## 2018-03-13 DIAGNOSIS — E119 Type 2 diabetes mellitus without complications: Secondary | ICD-10-CM

## 2018-03-13 DIAGNOSIS — I5033 Acute on chronic diastolic (congestive) heart failure: Secondary | ICD-10-CM

## 2018-03-13 DIAGNOSIS — K50919 Crohn's disease, unspecified, with unspecified complications: Secondary | ICD-10-CM

## 2018-03-13 DIAGNOSIS — D638 Anemia in other chronic diseases classified elsewhere: Secondary | ICD-10-CM

## 2018-03-13 DIAGNOSIS — D72829 Elevated white blood cell count, unspecified: Secondary | ICD-10-CM

## 2018-03-13 DIAGNOSIS — I10 Essential (primary) hypertension: Secondary | ICD-10-CM

## 2018-03-13 DIAGNOSIS — R000 Tachycardia, unspecified: Secondary | ICD-10-CM

## 2018-03-13 DIAGNOSIS — I255 Ischemic cardiomyopathy: Secondary | ICD-10-CM

## 2018-03-13 LAB — LACTATE DEHYDROGENASE: LDH: 213 U/L — ABNORMAL HIGH (ref 98–192)

## 2018-03-13 LAB — CBC WITH DIFFERENTIAL/PLATELET
Abs Immature Granulocytes: 0.1 10*3/uL (ref 0.0–0.1)
BASOS ABS: 0 10*3/uL (ref 0.0–0.1)
BASOS PCT: 0 %
Eosinophils Absolute: 0.3 10*3/uL (ref 0.0–0.7)
Eosinophils Relative: 2 %
HCT: 27.6 % — ABNORMAL LOW (ref 39.0–52.0)
Hemoglobin: 8.7 g/dL — ABNORMAL LOW (ref 13.0–17.0)
Immature Granulocytes: 1 %
Lymphocytes Relative: 13 %
Lymphs Abs: 1.6 10*3/uL (ref 0.7–4.0)
MCH: 29.3 pg (ref 26.0–34.0)
MCHC: 31.5 g/dL (ref 30.0–36.0)
MCV: 92.9 fL (ref 78.0–100.0)
MONO ABS: 1.2 10*3/uL — AB (ref 0.1–1.0)
Monocytes Relative: 9 %
NEUTROS ABS: 9.9 10*3/uL — AB (ref 1.7–7.7)
NEUTROS PCT: 75 %
PLATELETS: 417 10*3/uL — AB (ref 150–400)
RBC: 2.97 MIL/uL — AB (ref 4.22–5.81)
RDW: 16.1 % — ABNORMAL HIGH (ref 11.5–15.5)
WBC: 13.1 10*3/uL — AB (ref 4.0–10.5)

## 2018-03-13 LAB — BASIC METABOLIC PANEL
Anion gap: 11 (ref 5–15)
BUN: 12 mg/dL (ref 6–20)
CO2: 30 mmol/L (ref 22–32)
CREATININE: 4.66 mg/dL — AB (ref 0.61–1.24)
Calcium: 8.1 mg/dL — ABNORMAL LOW (ref 8.9–10.3)
Chloride: 99 mmol/L (ref 98–111)
GFR, EST AFRICAN AMERICAN: 15 mL/min — AB (ref >60)
GFR, EST NON AFRICAN AMERICAN: 13 mL/min — AB (ref >60)
Glucose, Bld: 79 mg/dL (ref 70–99)
POTASSIUM: 3.6 mmol/L (ref 3.5–5.1)
SODIUM: 140 mmol/L (ref 135–145)

## 2018-03-13 LAB — ECHOCARDIOGRAM LIMITED
HEIGHTINCHES: 68 in
WEIGHTICAEL: 2798.4 [oz_av]

## 2018-03-13 LAB — PROTIME-INR
INR: 2.98
PROTHROMBIN TIME: 30.7 s — AB (ref 11.4–15.2)

## 2018-03-13 LAB — GLUCOSE, CAPILLARY
GLUCOSE-CAPILLARY: 140 mg/dL — AB (ref 70–99)
GLUCOSE-CAPILLARY: 118 mg/dL — AB (ref 70–99)
GLUCOSE-CAPILLARY: 143 mg/dL — AB (ref 70–99)
Glucose-Capillary: 126 mg/dL — ABNORMAL HIGH (ref 70–99)

## 2018-03-13 MED ORDER — WARFARIN SODIUM 4 MG PO TABS
4.0000 mg | ORAL_TABLET | Freq: Once | ORAL | Status: AC
  Administered 2018-03-13: 4 mg via ORAL
  Filled 2018-03-13: qty 1

## 2018-03-13 MED ORDER — IOHEXOL 300 MG/ML  SOLN
100.0000 mL | Freq: Once | INTRAMUSCULAR | Status: AC | PRN
  Administered 2018-03-13: 100 mL via INTRAVENOUS

## 2018-03-13 MED ORDER — ISOSORB DINITRATE-HYDRALAZINE 20-37.5 MG PO TABS
1.5000 | ORAL_TABLET | Freq: Three times a day (TID) | ORAL | Status: DC
  Administered 2018-03-13 (×2): 1.5 via ORAL
  Administered 2018-03-13: 0.5 via ORAL
  Administered 2018-03-14 – 2018-03-19 (×15): 1.5 via ORAL
  Filled 2018-03-13 (×19): qty 2

## 2018-03-13 MED ORDER — CHLORHEXIDINE GLUCONATE CLOTH 2 % EX PADS
6.0000 | MEDICATED_PAD | Freq: Every day | CUTANEOUS | Status: DC
  Administered 2018-03-14 – 2018-03-18 (×5): 6 via TOPICAL

## 2018-03-13 MED ORDER — ISOSORB DINITRATE-HYDRALAZINE 20-37.5 MG PO TABS
1.0000 | ORAL_TABLET | Freq: Three times a day (TID) | ORAL | Status: DC
  Administered 2018-03-13: 1 via ORAL

## 2018-03-13 NOTE — Progress Notes (Signed)
Speed  Flow  PI  Power  LVIDD  AI  Aortic openings  MR  TR  Septum  RV   5400 3.8 4.0 4.3 4.2 trace 5/5  trace Mild/mod Pulling to right mildl   5500 4.1 3.3 4.3 3.9 trace 5/5 trace Mild/mod Slight pull to right mild  5600  4.2 3.3 4.5 3.7 trace 5/5 trace Mild/mod midline mild                                          Doppler MAP: 614 modified systolic   Ramp ECHO performed at bedside.  At completion of ramp study:  Fixed speed: 5500 Low speed limit: 5200  [name] RN, VAD Coordinator 24/7 pager [phone]

## 2018-03-13 NOTE — Progress Notes (Addendum)
Patient ID: [name], male   DOB: 12-03-1965, 52 y.o.   MRN: [id number]     Advanced Heart Failure Rounding Note  PCP-Cardiologist: No primary care provider on file.   Subjective:    CVVHD stopped 03/05/18.   Tolerating iHD well (MWF). ICD turned back on 7/8  MAPs improved 78-102 after HD.   Drowsy this am. Nausea okay. Not eating much. Did not get out of bed yesterday, but plans to today.   WBC trending back down 13.1. Tmax 99.0. Denies cough, fever, chills.   CT chest/abd 03/13/18: 1. No specific explanation for fever. 2. Cluster of gas bubbles in the left anterior pleura or mediastinum, expected after recent surgery. 3. Atelectasis and trace effusions. 4. Good opacification of outflow cannula after interval revision. 5. Packed wound in the left abdominal wall without superimposed fluid collection or cellulitis.  Hemoglobin 8.7 this am, LDH 213, INR 2.98. Denies bleeding  LVAD Interrogation HM 3: Speed: 5400 Flow: 3.5 PI: 5.6 Power: 4 6 PI events/24 hrs. VAD interrogated personally. Parameters stable.   Objective:   Weight Range: 174 lb 14.4 oz (79.3 kg) Body mass index is 26.59 kg/m.   Vital Signs:   Temp:  [97.8 F (36.6 C)-99.1 F (37.3 C)] 98.5 F (36.9 C) (07/09 0700) Pulse Rate:  [96-114] 103 (07/09 0700) Resp:  [14-20] 20 (07/09 0300) BP: (92-152)/(58-106) 151/96 (07/09 0700) SpO2:  [92 %-99 %] 94 % (07/09 0700) Weight:  [174 lb 14.4 oz (79.3 kg)-176 lb 5.9 oz (80 kg)] 174 lb 14.4 oz (79.3 kg) (07/09 0300) Last BM Date: 03/11/18  MAPs 78-102   Weight change: Filed Weights   03/12/18 0720 03/12/18 1130 03/13/18 0300  Weight: 180 lb 12.4 oz (82 kg) 176 lb 5.9 oz (80 kg) 174 lb 14.4 oz (79.3 kg)   Intake/Output:   Intake/Output Summary (Last 24 hours) at 03/13/2018 0735 Last data filed at 03/12/2018 1130 Gross per 24 hour  Intake -  Output 1500 ml  Net -1500 ml    Physical Exam   General:  NAD.  HEENT: Normal. Neck: Supple, JVP 5-6 cm.  Carotids OK. Left IJ TLC  Cardiac:  Mechanical heart sounds with LVAD hum present.  Lungs:  CTAB, normal effort.  Abdomen:  NT, ND, no HSM. No bruits or masses. +BS  LVAD exit site: Dressing dry and intact. No erythema or drainage. Stabilization device present and accurately applied.  Extremities:  Warm and dry. No cyanosis, clubbing, rash, or edema.  Neuro:  Alert & oriented x 3. Cranial nerves grossly intact. Moves all 4 extremities w/o difficulty. Affect pleasant     Telemetry   Sins tach 100s. personally reviewed.   Labs    CBC Recent Labs    03/12/18 0459 03/13/18 0430  WBC 16.0* 13.1*  NEUTROABS 14.1* 9.9*  HGB 8.6* 8.7*  HCT 27.2* 27.6*  MCV 91.6 92.9  PLT 427* 409*   Basic Metabolic Panel Recent Labs    03/12/18 0459 03/13/18 0430  NA 140 140  K 3.7 3.6  CL 102 99  CO2 28 30  GLUCOSE 116* 79  BUN 28* 12  CREATININE 6.80* 4.66*  CALCIUM 8.2* 8.1*   Liver Function Tests No results for input(s): AST, ALT, ALKPHOS, BILITOT, PROT, ALBUMIN in the last 72 hours. No results for input(s): LIPASE, AMYLASE in the last 72 hours. Cardiac Enzymes No results for input(s): CKTOTAL, CKMB, CKMBINDEX, TROPONINI in the last 72 hours.  BNP: BNP (last 3 results) Recent Labs  07/03/17 0037 03/02/18 0400 03/08/18 0008  BNP 758.9* 694.1* 1,576.6*    ProBNP (last 3 results) No results for input(s): PROBNP in the last 8760 hours.   D-Dimer No results for input(s): DDIMER in the last 72 hours. Hemoglobin A1C No results for input(s): HGBA1C in the last 72 hours. Fasting Lipid Panel No results for input(s): CHOL, HDL, LDLCALC, TRIG, CHOLHDL, LDLDIRECT in the last 72 hours. Thyroid Function Tests No results for input(s): TSH, T4TOTAL, T3FREE, THYROIDAB in the last 72 hours.  Invalid input(s): FREET3  Other results:   Imaging    Ct Chest W Contrast  Result Date: 03/13/2018 CLINICAL DATA:  Fever of unknown origin EXAM: CT CHEST, ABDOMEN, AND PELVIS WITH  CONTRAST TECHNIQUE: Multidetector CT imaging of the chest, abdomen and pelvis was performed following the standard protocol during bolus administration of intravenous contrast. CONTRAST:  164m OMNIPAQUE IOHEXOL 300 MG/ML  SOLN COMPARISON:  02/28/2018 FINDINGS: CT CHEST FINDINGS Cardiovascular: Cardiomegaly with left ventricular hypertrophy. Recent redo LVAD, now with well opacified outflow cannula. No acute vascular finding. Mediastinum/Nodes: Loculated gas bubbles in the left anterior chest. No noted mediastinum is or adenopathy. Lungs/Pleura: Atelectasis with trace pleural fluid.  No pneumothorax Musculoskeletal: Unremarkable sternotomy. No acute or aggressive finding CT ABDOMEN PELVIS FINDINGS Hepatobiliary: No focal liver abnormality.No evidence of biliary obstruction or stone. Pancreas: Unremarkable. Spleen: Unremarkable. Adrenals/Urinary Tract: Negative adrenals. No hydronephrosis or stone. Unremarkable bladder. Stomach/Bowel: No obstruction. No inflammatory changes. Right hemicolectomy. Vascular/Lymphatic: Atherosclerotic calcification without acute finding . No mass or collection. Reproductive:Negative Other: Body wall edema. There is a packed wound in the left abdominal wall without fluid collection. Negative for abscess. Musculoskeletal: No acute abnormalities. IMPRESSION: 1. No specific explanation for fever. 2. Cluster of gas bubbles in the left anterior pleura or mediastinum, expected after recent surgery. 3. Atelectasis and trace effusions. 4. Good opacification of outflow cannula after interval revision. 5. Packed wound in the left abdominal wall without superimposed fluid collection or cellulitis. Electronically Signed   By: JMonte FantasiaM.D.   On: 03/13/2018 02:51   Ct Abdomen Pelvis W Contrast  Result Date: 03/13/2018 CLINICAL DATA:  Fever of unknown origin EXAM: CT CHEST, ABDOMEN, AND PELVIS WITH CONTRAST TECHNIQUE: Multidetector CT imaging of the chest, abdomen and pelvis was performed  following the standard protocol during bolus administration of intravenous contrast. CONTRAST:  1056mOMNIPAQUE IOHEXOL 300 MG/ML  SOLN COMPARISON:  02/28/2018 FINDINGS: CT CHEST FINDINGS Cardiovascular: Cardiomegaly with left ventricular hypertrophy. Recent redo LVAD, now with well opacified outflow cannula. No acute vascular finding. Mediastinum/Nodes: Loculated gas bubbles in the left anterior chest. No noted mediastinum is or adenopathy. Lungs/Pleura: Atelectasis with trace pleural fluid.  No pneumothorax Musculoskeletal: Unremarkable sternotomy. No acute or aggressive finding CT ABDOMEN PELVIS FINDINGS Hepatobiliary: No focal liver abnormality.No evidence of biliary obstruction or stone. Pancreas: Unremarkable. Spleen: Unremarkable. Adrenals/Urinary Tract: Negative adrenals. No hydronephrosis or stone. Unremarkable bladder. Stomach/Bowel: No obstruction. No inflammatory changes. Right hemicolectomy. Vascular/Lymphatic: Atherosclerotic calcification without acute finding . No mass or collection. Reproductive:Negative Other: Body wall edema. There is a packed wound in the left abdominal wall without fluid collection. Negative for abscess. Musculoskeletal: No acute abnormalities. IMPRESSION: 1. No specific explanation for fever. 2. Cluster of gas bubbles in the left anterior pleura or mediastinum, expected after recent surgery. 3. Atelectasis and trace effusions. 4. Good opacification of outflow cannula after interval revision. 5. Packed wound in the left abdominal wall without superimposed fluid collection or cellulitis. Electronically Signed   By: JoNeva Seat.  On: 03/13/2018 02:51     Medications:     Scheduled Medications: . aspirin EC  81 mg Oral Daily  . atorvastatin  40 mg Oral q1800  . bisacodyl  10 mg Oral Daily   Or  . bisacodyl  10 mg Rectal Daily  . Chlorhexidine Gluconate Cloth  6 each Topical Daily  . citalopram  20 mg Oral Daily  . darbepoetin (ARANESP) injection - DIALYSIS   40 mcg Intravenous Q Mon-HD  . docusate sodium  200 mg Oral Daily  . dronabinol  2.5 mg Oral BID AC  . erythromycin  250 mg Oral TID  . feeding supplement (GLUCERNA SHAKE)  237 mL Oral TID BM  . insulin aspart  0-5 Units Subcutaneous QHS  . insulin aspart  0-9 Units Subcutaneous TID WC  . isosorbide-hydrALAZINE  1 tablet Oral TID  . mouth rinse  15 mL Mouth Rinse BID  . metoCLOPramide (REGLAN) injection  5 mg Intravenous Q6H  . pantoprazole  40 mg Oral BID  . sodium chloride flush  10-40 mL Intracatheter Q12H  . sodium chloride flush  3 mL Intravenous Q12H  . Warfarin - Pharmacist Dosing Inpatient   Does not apply q1800    Infusions: . sodium chloride    . 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 Post VAD course complicated by ESRD and severe DM gastroparesis. Admitted with low/no-flow alarms due to LVAD outflow graft kink (does not have bend clip).   Assessment/Plan   1. Cardiogenic shock due to LVAD complication with LVAD outflow graft thrombosis: s/p emergent LVAD pump exchange 6/27 for pump thrombosis in setting of LVAD graft kink (did not have bend clip).  Volume looks okay.   - Control volume with HD.  - Off milrinone, stable. No change.  2. VAD: s/p pump exchange 2/2 HM3 graft kink.  Has HM3.  MAP 70-100s - VAD interrogated personally. Parameters stable.  - Continue coumadin per pharmacy.  Continue ASA 81. INR 2.98 - Increase Bidil to 1.5 tabs tid, take on non-HD days.  3. Intractable Nausea/Vomiting secondary to gastroparesis: Amlodipine stopped. Nausea okay this morning, but not eatching much.  - Continue reglan, phenergan, and ativan as needed - Continue home Marinol.  -  Continue erythromycin 250 mg TID a day for gastroparesis  He has been seen by Dr Derrill Kay on 01/24/18 [facility] gastroparesis specialist) 4. ESRD: CVVHD stopped 03/05/18. Tolerating iHD now, gets MWF.  - Getting HD now. No change.  5. DM,insulin dependent - SSI for now. No change.  6. Acute respiratory failure: Extubated 6/28  Stable on [place].  - Up to chair and mobilize. As tolerated. PT/OT recommending CIR.  7. HTN: MAPs improved yesterday, but elevated again today.  - Continue Bidil.  - Amlodipine caused gastroparesis symptoms.  - MAPs improved after HD 70-100s overnight, but elevated again this morning. Will give Bidil early this am and see if BP improves. If not, may need to increase. Adjusted to q8 hours instead of TID.  8. Leukocytosis - WBC trending back down 13.1. Tmax 99.0 - Blood cultures pending - CT chest/abd with no source of infection 9. Anemia: No overt bleeding.  Follow closely.  10. Deconditioning  - PT/OT following and  recommending CIR. Will consult.   I reviewed the LVAD parameters from today, and compared the results to the patient's prior recorded data.  No programming changes were made.  The LVAD is functioning within specified parameters.  The patient performs LVAD self-test daily.  LVAD interrogation was negative for any significant power changes, alarms or PI events/speed drops.  LVAD equipment check completed and is in good working order.  Back-up equipment present.   LVAD education done on emergency procedures and precautions and reviewed exit site care.   Length of Stay: [address], NP  03/13/2018, 7:35 AM  Advanced Heart Failure Team Pager [phone] (M-F; 7a - 4p)  Please contact [facility] for night-coverage after hours (4p -7a ) and weekends on [email or web address]  Patient seen with NP, agree with the above note.  Nausea better, will try to eat this morning.  Not getting out of bed much.  MAP still running high.  CT abdomen/pelvis/chest this morning with no evidence  for infection.   On exam, normal LVAD sounds.  JVP not elevated, no peripheral edema.   No evidence of infection at this point, Tm 99.  Not on abx.   Needs to get out of bed.  Discussed with nurse and patient.   MAP remains high, increase Bidil to 1.5 tabs tid.  Amlodipine stopped due to concern it made gastroparesis worse.    Gastroparesis symptoms better, will try to eat today.    Ramp echo this morning.   [name] 03/13/2018 8:14 AM

## 2018-03-13 NOTE — Care Management (Signed)
CM submitted benefit check for Bidil - CM provided information to HF team.    BIDIL 20-37.5 MG TAB TID ISOSORBIDE-HYDRALAZINE   COVER- YES  CO-PAY- $ 100.00 FOR 90 DAY SUPPLY M/O ONLY  THRU [facility] # [phone]  TIER- 3 DRUG  PRIOR APPROVAL- NO   PREFERRED PHARMACY : YES  [facility] M/O # [phone]  ( CANNOT USE RETAIL [facility] )   CM contacted [place] is a mail order system, once prescription is called in to [phone] it will take up to 48 hours to process and then 1-2 days to ship medication directly to pt home address.  HF team to arrange samples for pt until mail order arrives. CM will continue to follow for discharge needs

## 2018-03-13 NOTE — Clinical Social Work Note (Signed)
CSW acknowledges SNF consult. PT now recommending HHPT. Patient ambulated 500 feet modified independent today.  CSW signing off. Consult again if any other social work needs arise.  Dayton Scrape, [place]

## 2018-03-13 NOTE — Progress Notes (Signed)
CARDIAC REHAB PHASE I   PRE:  Rate/Rhythm: 99 SR  BP:  Sitting: 78 doppler      SaO2: 97 RA  MODE:  Ambulation: 390 ft  111 ST with PVCs peak HR  POST:  Rate/Rhythm: 101 ST  BP:  Sitting: 78 doppler    SaO2: 98 RA   Pt ambulated 349f in hallway assist of one with rollator. Pt took 1 sitting rest break. Pt states he "feels good today". Will continue to follow and ambulate pt.  [id number] Falco RN BSN 03/13/2018 3:37 PM

## 2018-03-13 NOTE — Consult Note (Signed)
Physical Medicine and Rehabilitation Consult Reason for Consult: Decreased functional mobility Referring Physician: Dr. Haroldine Laws   HPI: [name] is a 52 y.o. right-handed male with history of hypertension, Crohn's disease, diabetes, end-stage renal disease with hemodialysis, diastolic congestive heart failure, CAD/MI with ischemic cardiomyopathy status post HeartMate 3 LVAD October 2018.  Per chart review and patient, patient lives with son and daughter.  Independent with assistive device prior to admission.  Multilevel home with bedroom on [address] and 2 steps to entry.  He has a family member with him at all times.  Presented 02/28/2018 due to low flow alarm.  CT of the chest showed twisted outflow graft with possible thrombus within the graft.  He was taken emergently to the OR underwent VAD exchange.  Hospital course follow-up per cardiology services.  Hemodialysis ongoing as per renal services.  Chronic anemia 8.7 and monitored.  Patient remains on chronic Coumadin as prior to admission.  Tolerating a regular consistency diet.  Therapy evaluations completed with recommendations of physical medicine rehab consult.   Review of Systems  Constitutional: Positive for malaise/fatigue. Negative for chills and fever.  Eyes: Negative for blurred vision and double vision.  Respiratory: Positive for cough and shortness of breath.   Cardiovascular: Positive for palpitations. Negative for chest pain.  Gastrointestinal: Positive for heartburn and nausea.       GERD  Genitourinary: Negative for hematuria.  Skin: Negative for rash.  Psychiatric/Behavioral: The patient has insomnia.   All other systems reviewed and are negative.  Past Medical History:  Diagnosis Date  . Angina   . ASCVD (arteriosclerotic cardiovascular disease)    , Anterior infarction 2005, LAD diagonal bifurcation intervention 03/2004  . Automatic implantable cardiac defibrillator -St. Jude's       . Benign neoplasm  of colon   . CHF (congestive heart failure) ([place])   . Chronic systolic heart failure ([place])   . Coronary artery disease     Widely patent previously placed stents in the left anterior   . Crohn's disease ([place])   . 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 Dialysis patient ([place])   . Dyspnea   . Gastroparesis   . GERD (gastroesophageal reflux disease)   . High cholesterol   . Hyperlipidemia   . Hypersomnolent    Previous diagnosis of narcolepsy  . Hypertension, essential   . Ischemic cardiomyopathy    Ejection fraction 15-20% catheterization 2010  . Type II or unspecified type diabetes mellitus without mention of complication, not stated as uncontrolled   . Unspecified gastritis and gastroduodenitis without mention of hemorrhage    Past Surgical History:  Procedure Laterality Date  . APPENDECTOMY    . AV FISTULA PLACEMENT Right 06/26/2017   Procedure: ARTERIOVENOUS (AV) FISTULA CREATION VERSUS GRAFT RIGHT  ARM;  Surgeon: Conrad Ponderosa, MD;  Location: [facility] OR;  Service: Vascular;  Laterality: Right;  . AV FISTULA PLACEMENT Right 10/31/2017   Procedure: INSERTION OF ARTERIOVENOUS (AV) ARTEGRAFT,  RIGHT UPPER ARM;  Surgeon: Conrad Rondo, MD;  Location: [place];  Service: Vascular;  Laterality: Right;  . CARDIAC DEFIBRILLATOR PLACEMENT  2010   St. Jude ICD  . COLECTOMY  ~ 2003   "for Crohn's" ascending colon.    . CORONARY STENT INTERVENTION N/A 11/25/2016   Procedure: Coronary Stent Intervention;  Surgeon: Leonie Man, MD;  Location: [place] [facility];  Service: Cardiovascular;  Laterality: N/A;  . EP IMPLANTABLE DEVICE N/A 09/14/2016  Procedure: ICD Generator Changeout;  Surgeon: Deboraha Sprang, MD;  Location: [place] [facility];  Service: Cardiovascular;  Laterality: N/A;  . ESOPHAGOGASTRODUODENOSCOPY N/A 07/12/2017   Procedure: ESOPHAGOGASTRODUODENOSCOPY (EGD);  Surgeon: Jerene Bears, MD;  Location: [facility];  Service: Gastroenterology;  Laterality: N/A;  VAD pt  so VAD team will need to accompany pt.   [name] FETAL SURGERY FOR CONGENITAL HERNIA     ????? pt has no knowledge of this..    . INGUINAL HERNIA REPAIR    . INSERTION OF DIALYSIS CATHETER Right 06/26/2017   Procedure: INSERTION OF TUNNELED  DIALYSIS CATHETER RIGHT INTERNAL JUGULAR;  Surgeon: Conrad Beaverville, MD;  Location: [place];  Service: Vascular;  Laterality: Right;  . INSERTION OF IMPLANTABLE LEFT VENTRICULAR ASSIST DEVICE N/A 06/12/2017   Procedure: INSERTION OF IMPLANTABLE LEFT VENTRICULAR ASSIST [place] 3;  Surgeon: Ivin Poot, MD;  Location: [place];  Service: Open Heart Surgery;  Laterality: N/A;  HM3 LVAD  CIRC ARREST  NITRIC OXIDE  . INSERTION OF IMPLANTABLE LEFT VENTRICULAR ASSIST DEVICE N/A 02/28/2018   Procedure: REDO INSERTION OF IMPLANTABLE LEFT VENTRICULAR ASSIST DEVICE - HEARTMATE 3;  Surgeon: Ivin Poot, MD;  Location: [place];  Service: Open Heart Surgery;  Laterality: N/A;  . IR REMOVAL TUN CV CATH W/O FL  02/26/2018  . IR THORACENTESIS ASP PLEURAL SPACE W/IMG GUIDE  06/28/2017  . LIGATION OF ARTERIOVENOUS  FISTULA Right 10/31/2017   Procedure: LIGATION OF BRACHIO-BASILIC VEIN TRANSPOSITION RIGHT ARM;  Surgeon: Conrad Huxley, MD;  Location: [place];  Service: Vascular;  Laterality: Right;  . MULTIPLE EXTRACTIONS WITH ALVEOLOPLASTY N/A 06/08/2017   Procedure: MULTIPLE EXTRACTION WITH ALVEOLOPLASTY AND PRE PROSTHETIC SURGERY AS NEEDED;  Surgeon: Lenn Cal, DDS;  Location: [place];  Service: Oral Surgery;  Laterality: N/A;  . RIGHT HEART CATH N/A 06/07/2017   Procedure: RIGHT HEART CATH;  Surgeon: Larey Dresser, MD;  Location: [place] [facility];  Service: Cardiovascular;  Laterality: N/A;  . RIGHT HEART CATH N/A 02/08/2018   Procedure: RIGHT HEART CATH;  Surgeon: Larey Dresser, MD;  Location: [place] [facility];  Service: Cardiovascular;  Laterality: N/A;  . RIGHT/LEFT HEART CATH AND CORONARY ANGIOGRAPHY N/A 11/23/2016   Procedure: Right/Left Heart Cath and  Coronary Angiography;  Surgeon: Larey Dresser, MD;  Location: [place] [facility];  Service: Cardiovascular;  Laterality: N/A;  . TEE WITHOUT CARDIOVERSION N/A 06/12/2017   Procedure: TRANSESOPHAGEAL ECHOCARDIOGRAM (TEE);  Surgeon: Prescott Gum, Collier Salina, MD;  Location: [place];  Service: Open Heart Surgery;  Laterality: N/A;  . TEE WITHOUT CARDIOVERSION N/A 02/28/2018   Procedure: TRANSESOPHAGEAL ECHOCARDIOGRAM (TEE);  Surgeon: Prescott Gum, Collier Salina, MD;  Location: [place];  Service: Open Heart Surgery;  Laterality: N/A;  . THROMBECTOMY AND REVISION OF ARTERIOVENTOUS (AV) GORETEX  GRAFT Right 12/12/2017   Procedure: THROMBECTOMY ARTERIOVENTOUS (AV) GORETEX  GRAFT RIGHT UPPER ARM;  Surgeon: Conrad R[place], MD;  Location[facility];  Service: Vascular;  Laterality: Right;   Family History  Problem Relation Age of Onset  . Colon polyps Mother   . Diabetes Mother   . Heart attack Father   . Diabetes Father   . Stroke Paternal Grandfather   . Colitis Sister   . Heart disease Brother   . Colitis Sister   . Colon cancer Neg Hx    Social History:  reports that he has never smoked. He has never used smokeless tobacco. He reports that he does not drink alcohol  or use drugs. Allergies:  Allergies  Allergen Reactions  . Metformin And Related Diarrhea   Medications Prior to Admission  Medication Sig Dispense Refill  . amLODipine (NORVASC) 2.5 MG tablet Take 1 tablet (2.5 mg total) by mouth daily. 180 tablet 3  . aspirin EC 81 MG tablet Take 1 tablet (81 mg total) by mouth daily. 90 tablet 3  . atorvastatin (LIPITOR) 40 MG tablet Take 1 tablet (40 mg total) by mouth daily at 6 PM. 30 tablet 6  . docusate sodium (COLACE) 100 MG capsule Take 2 capsules (200 mg total) by mouth daily. (Patient taking differently: Take 100 mg by mouth 2 (two) times daily. ) 10 capsule 0  . dronabinol (MARINOL) 2.5 MG capsule Take 1 capsule (2.5 mg total) by mouth 2 (two) times daily before lunch and supper. 60 capsule 5  . hydrALAZINE  (APRESOLINE) 25 MG tablet Take 1.5 tablets (37.5 mg total) by mouth 3 (three) times daily. Take on non-hd days 135 tablet 3  . insulin aspart (NOVOLOG) 100 UNIT/ML injection Inject 5 Units into the skin 3 (three) times daily with meals. 1 vial 0  . insulin glargine (LANTUS) 100 UNIT/ML injection Inject 0.1 mLs (10 Units total) into the skin daily.    [name] LORazepam (ATIVAN) 0.5 MG tablet Take 1 tablet (0.5 mg total) by mouth every 12 (twelve) hours as needed for anxiety or sleep. 30 tablet 0  . metoCLOPramide (REGLAN) 10 MG tablet Take 1 tablet (10 mg total) by mouth 3 (three) times daily. 90 tablet 6  . ondansetron (ZOFRAN ODT) 4 MG disintegrating tablet Take 1 tablet (4 mg total) by mouth every 6 (six) hours as needed for nausea or vomiting. 40 tablet 1  . pantoprazole (PROTONIX) 40 MG tablet Take 40 mg by mouth 2 (two) times daily.     . promethazine (PHENERGAN) 12.5 MG tablet Take 1 tablet (12.5 mg total) by mouth every 6 (six) hours as needed for nausea or vomiting. 30 tablet 0  . sildenafil (REVATIO) 20 MG tablet Take 1 tablet (20 mg total) by mouth 3 (three) times daily. 90 tablet 6  . warfarin (COUMADIN) 5 MG tablet Take 1 tablet (5 mg total) by mouth daily at 6 PM. (Patient taking differently: Take 5 mg by mouth daily at 6 PM. 2.5 mg (5 mg x 0.5) every Fri; 5 mg (5 mg x 1) all other days) 30 tablet 6    Home: [place] expects to be discharged to:: Private residence Living Arrangements: Children Available Help at Discharge: Family, Available 24 hours/day Type of Home: House Home Access: Stairs to enter Technical brewer of Steps: 2 Entrance Stairs-Rails: None Home Layout: Multi-level, Able to live on main level with bedroom/bathroom Alternate Level Stairs-Number of Steps: flight Alternate Level Stairs-Rails: Left Bathroom Shower/Tub: Chiropodist: Standard Home Equipment: Cane - single point, Environmental consultant - 4 wheels Additional Comments: pt lives  with son and daughter, one of which is with him all the time. His bedroom is downstairs but the kids have moved him to the main level so he will only have to do 2 STE  Functional History: Prior Function Level of Independence: Independent with assistive device(s), Needs assistance Gait / Transfers Assistance Needed: Uses rollator for gait ADL's / Homemaking Assistance Needed: Pt indicates he was performing ADLs mod I  Comments: Pt reports he was supposed to have started cardiac rehab this week  Functional Status:  Mobility: Bed Mobility Overal bed mobility: Needs Assistance Bed Mobility:  Supine to Sit Supine to sit: Min assist, HOB elevated General bed mobility comments: HOB 35 degrees, cues for sequence with increased time and min assist to elevate trunk Transfers Overall transfer level: Needs assistance Equipment used: 4-wheeled walker(rollator) Transfers: Sit to/from Stand, [employer] Transfers Sit to Stand: Mod assist Stand pivot transfers: Min assist General transfer comment: mod assist to stand from bed x 2 trials with cues and assist for rise and anterior translation with hands on knees. On 2nd trial pt holding Rw and able to pivot to chair min assist with encouragement and cues Ambulation/Gait Ambulation/Gait assistance: Supervision Gait Distance (Feet): 400 Feet Assistive device: 4-wheeled walker(rollator) Gait Pattern/deviations: Step-through pattern, Decreased stride length General Gait Details: pt declined attempting stating fatigue and nausea Gait velocity: Decreased Gait velocity interpretation: 1.31 - 2.62 ft/sec, indicative of limited community ambulator    ADL: ADL Overall ADL's : Needs assistance/impaired Eating/Feeding: Set up, Sitting Grooming: Wash/dry hands, Wash/dry face, Oral care, Brushing hair, Set up, Sitting Upper Body Bathing: Maximal assistance, Sitting Lower Body Bathing: Total assistance, Sit to/from stand Upper Body Dressing : Minimal assistance,  Sitting Upper Body Dressing Details (indicate cue type and reason): front opening gown and LVAD vest Lower Body Dressing: Set up, Sitting/lateral leans Toilet Transfer: Minimal assistance, +2 for physical assistance, +2 for safety/equipment, Stand-pivot, BSC, RW Toileting- Clothing Manipulation and Hygiene: Total assistance, Sit to/from stand Functional mobility during ADLs: Supervision/safety, Rolling walker(rollator) General ADL Comments: Pt able to change power sources independently.   Cognition: Cognition Overall Cognitive Status: Impaired/Different from baseline Orientation Level: Oriented X4 Cognition Arousal/Alertness: Awake/alert Behavior During Therapy: Flat affect Overall Cognitive Status: Impaired/Different from baseline Area of Impairment: Memory Memory: Decreased recall of precautions General Comments: pt unable to state sternal precautions  Blood pressure (!) 151/96, pulse (!) 103, temperature 98.5 F (36.9 C), temperature source Oral, resp. rate 16, height 5' 8"  (1.727 m), weight 79.3 kg (174 lb 14.4 oz), SpO2 94 %. Physical Exam  Vitals reviewed. Constitutional: He is oriented to person, place, and time. He appears well-developed. He appears distressed (Nauseated).  Frail  HENT:  Head: Normocephalic and atraumatic.  Eyes: EOM are normal. Right eye exhibits no discharge. Left eye exhibits no discharge.  Neck: Normal range of motion. Neck supple.  Cardiovascular: Regular rhythm.  +Tachycardia  Respiratory: Breath sounds normal. No respiratory distress.  Increased WOB  GI: Soft. Bowel sounds are normal. He exhibits no distension.  Musculoskeletal:  No edema or tenderness in extremities  Neurological: He is alert and oriented to person, place, and time.  Motor: 4-4+/5 proximal to distal (limited by fatigue)  Skin: Skin is warm and dry.  Psychiatric: He has a normal mood and affect. His behavior is normal.    Results for orders placed or performed during the  hospital encounter of 02/28/18 (from the past 24 hour(s))  Glucose, capillary     Status: Abnormal   Collection Time: 03/12/18 12:47 PM  Result Value Ref Range   Glucose-Capillary 107 (H) 70 - 99 mg/dL   Comment 1 Notify RN    Comment 2 Document in Chart   Glucose, capillary     Status: Abnormal   Collection Time: 03/12/18  5:22 PM  Result Value Ref Range   Glucose-Capillary 108 (H) 70 - 99 mg/dL   Comment 1 Notify RN    Comment 2 Document in Chart   Glucose, capillary     Status: None   Collection Time: 03/12/18  9:41 PM  Result Value Ref Range  Glucose-Capillary 91 70 - 99 mg/dL  Lactate dehydrogenase     Status: Abnormal   Collection Time: 03/13/18  4:30 AM  Result Value Ref Range   LDH 213 (H) 98 - 192 U/L  Protime-INR     Status: Abnormal   Collection Time: 03/13/18  4:30 AM  Result Value Ref Range   Prothrombin Time 30.7 (H) 11.4 - 15.2 seconds   INR 2.98   CBC with Differential     Status: Abnormal   Collection Time: 03/13/18  4:30 AM  Result Value Ref Range   WBC 13.1 (H) 4.0 - 10.5 K/uL   RBC 2.97 (L) 4.22 - 5.81 MIL/uL   Hemoglobin 8.7 (L) 13.0 - 17.0 g/dL   HCT 27.6 (L) 39.0 - 52.0 %   MCV 92.9 78.0 - 100.0 fL   MCH 29.3 26.0 - 34.0 pg   MCHC 31.5 30.0 - 36.0 g/dL   RDW 16.1 (H) 11.5 - 15.5 %   Platelets 417 (H) 150 - 400 K/uL   Neutrophils Relative % 75 %   Neutro Abs 9.9 (H) 1.7 - 7.7 K/uL   Lymphocytes Relative 13 %   Lymphs Abs 1.6 0.7 - 4.0 K/uL   Monocytes Relative 9 %   Monocytes Absolute 1.2 (H) 0.1 - 1.0 K/uL   Eosinophils Relative 2 %   Eosinophils Absolute 0.3 0.0 - 0.7 K/uL   Basophils Relative 0 %   Basophils Absolute 0.0 0.0 - 0.1 K/uL   Immature Granulocytes 1 %   Abs Immature Granulocytes 0.1 0.0 - 0.1 K/uL  Basic metabolic panel     Status: Abnormal   Collection Time: 03/13/18  4:30 AM  Result Value Ref Range   Sodium 140 135 - 145 mmol/L   Potassium 3.6 3.5 - 5.1 mmol/L   Chloride 99 98 - 111 mmol/L   CO2 30 22 - 32 mmol/L    Glucose, Bld 79 70 - 99 mg/dL   BUN 12 6 - 20 mg/dL   Creatinine, Ser 4.66 (H) 0.61 - 1.24 mg/dL   Calcium 8.1 (L) 8.9 - 10.3 mg/dL   GFR calc non Af Amer 13 (L) >60 mL/min   GFR calc Af Amer 15 (L) >60 mL/min   Anion gap 11 5 - 15  Glucose, capillary     Status: Abnormal   Collection Time: 03/13/18  8:01 AM  Result Value Ref Range   Glucose-Capillary 140 (H) 70 - 99 mg/dL   Ct Chest W Contrast  Result Date: 03/13/2018 CLINICAL DATA:  Fever of unknown origin EXAM: CT CHEST, ABDOMEN, AND PELVIS WITH CONTRAST TECHNIQUE: Multidetector CT imaging of the chest, abdomen and pelvis was performed following the standard protocol during bolus administration of intravenous contrast. CONTRAST:  133m OMNIPAQUE IOHEXOL 300 MG/ML  SOLN COMPARISON:  02/28/2018 FINDINGS: CT CHEST FINDINGS Cardiovascular: Cardiomegaly with left ventricular hypertrophy. Recent redo LVAD, now with well opacified outflow cannula. No acute vascular finding. Mediastinum/Nodes: Loculated gas bubbles in the left anterior chest. No noted mediastinum is or adenopathy. Lungs/Pleura: Atelectasis with trace pleural fluid.  No pneumothorax Musculoskeletal: Unremarkable sternotomy. No acute or aggressive finding CT ABDOMEN PELVIS FINDINGS Hepatobiliary: No focal liver abnormality.No evidence of biliary obstruction or stone. Pancreas: Unremarkable. Spleen: Unremarkable. Adrenals/Urinary Tract: Negative adrenals. No hydronephrosis or stone. Unremarkable bladder. Stomach/Bowel: No obstruction. No inflammatory changes. Right hemicolectomy. Vascular/Lymphatic: Atherosclerotic calcification without acute finding . No mass or collection. Reproductive:Negative Other: Body wall edema. There is a packed wound in the left abdominal wall without fluid collection.  Negative for abscess. Musculoskeletal: No acute abnormalities. IMPRESSION: 1. No specific explanation for fever. 2. Cluster of gas bubbles in the left anterior pleura or mediastinum, expected after  recent surgery. 3. Atelectasis and trace effusions. 4. Good opacification of outflow cannula after interval revision. 5. Packed wound in the left abdominal wall without superimposed fluid collection or cellulitis. Electronically Signed   By: Monte Fantasia M.D.   On: 03/13/2018 02:51   Ct Abdomen Pelvis W Contrast  Result Date: 03/13/2018 CLINICAL DATA:  Fever of unknown origin EXAM: CT CHEST, ABDOMEN, AND PELVIS WITH CONTRAST TECHNIQUE: Multidetector CT imaging of the chest, abdomen and pelvis was performed following the standard protocol during bolus administration of intravenous contrast. CONTRAST:  156m OMNIPAQUE IOHEXOL 300 MG/ML  SOLN COMPARISON:  02/28/2018 FINDINGS: CT CHEST FINDINGS Cardiovascular: Cardiomegaly with left ventricular hypertrophy. Recent redo LVAD, now with well opacified outflow cannula. No acute vascular finding. Mediastinum/Nodes: Loculated gas bubbles in the left anterior chest. No noted mediastinum is or adenopathy. Lungs/Pleura: Atelectasis with trace pleural fluid.  No pneumothorax Musculoskeletal: Unremarkable sternotomy. No acute or aggressive finding CT ABDOMEN PELVIS FINDINGS Hepatobiliary: No focal liver abnormality.No evidence of biliary obstruction or stone. Pancreas: Unremarkable. Spleen: Unremarkable. Adrenals/Urinary Tract: Negative adrenals. No hydronephrosis or stone. Unremarkable bladder. Stomach/Bowel: No obstruction. No inflammatory changes. Right hemicolectomy. Vascular/Lymphatic: Atherosclerotic calcification without acute finding . No mass or collection. Reproductive:Negative Other: Body wall edema. There is a packed wound in the left abdominal wall without fluid collection. Negative for abscess. Musculoskeletal: No acute abnormalities. IMPRESSION: 1. No specific explanation for fever. 2. Cluster of gas bubbles in the left anterior pleura or mediastinum, expected after recent surgery. 3. Atelectasis and trace effusions. 4. Good opacification of outflow cannula  after interval revision. 5. Packed wound in the left abdominal wall without superimposed fluid collection or cellulitis. Electronically Signed   By: JMonte FantasiaM.D.   On: 03/13/2018 02:51    Assessment/Plan: Diagnosis: Debility Labs independently reviewed.  Records reviewed and summated above.  1. Does the need for close, 24 hr/day medical supervision in concert with the patient's rehab needs make it unreasonable for this patient to be served in a less intensive setting? Yes  2. Co-Morbidities requiring supervision/potential complications: HTN (monitor and provide prns in accordance with increased physical exertion and pain), Crohn's disease (cont meds), DM (Monitor in accordance with exercise and adjust meds as necessary), end-stage renal disease with hemodialysis (recs per Nephro), diastolic congestive heart failure (monitor for signs/symptoms of fluid overload), CAD/MI with ischemic cardiomyopathy status post HeartMate 3 LVAD, Chronic anemia (transfuse if necessary to ensure appropriate perfusion for increased activity tolerance), Tachycardia (monitor in accordance with pain and increasing activity), leukocytosis (cont to monitor for signs and symptoms of infection, further workup if indicated) 3. Due to safety, skin/wound care, disease management and patient education, does the patient require 24 hr/day rehab nursing? Yes 4. Does the patient require coordinated care of a physician, rehab nurse, PT (1-2 hrs/day, 5 days/week) and OT (1-2 hrs/day, 5 days/week) to address physical and functional deficits in the context of the above medical diagnosis(es)? Yes Addressing deficits in the following areas: balance, endurance, locomotion, strength, transferring, bathing, dressing, toileting and psychosocial support 5. Can the patient actively participate in an intensive therapy program of at least 3 hrs of therapy per day at least 5 days per week? Potentially 6. The potential for patient to make  measurable gains while on inpatient rehab is excellent 7. Anticipated functional outcomes upon discharge from inpatient rehab are  modified independent and supervision  with PT, modified independent and supervision with OT, n/a with SLP. 8. Estimated rehab length of stay to reach the above functional goals is: 7-10 days. 9. Anticipated D/C setting: Home 10. Anticipated post D/C treatments: HH therapy and Home excercise program 11. Overall Rehab/Functional Prognosis: good  RECOMMENDATIONS: This patient's condition is appropriate for continued rehabilitative care in the following setting: CIR when able to tolerate 3 hours of therapy/day. Patient has agreed to participate in recommended program. Yes Note that insurance prior authorization may be required for reimbursement for recommended care.  Comment: Rehab Admissions Coordinator to follow up.   I have personally performed a face to face diagnostic evaluation, including, but not limited to relevant history and physical exam findings, of this patient and developed relevant assessment and plan.  Additionally, I have reviewed and concur with the physician assistant's documentation above.   Delice Lesch, MD, ABPMR Lavon Paganini Angiulli, PA-C 03/13/2018

## 2018-03-13 NOTE — Progress Notes (Addendum)
[facility] Progress Note  Subjective: 1.5 L off w HD yesterday  Vitals:   03/13/18 0300 03/13/18 0700 03/13/18 1059 03/13/18 1210  BP: 119/82 (!) 151/96 101/89   Pulse: 96 (!) 103 97 (!) 104  Resp: 20 16    Temp: 99 F (37.2 C) 98.5 F (36.9 C) 98.2 F (36.8 C)   TempSrc: Oral Oral Oral   SpO2: 99% 94% 98% 95%  Weight: 79.3 kg (174 lb 14.4 oz)     Height:        Inpatient medications: . aspirin EC  81 mg Oral Daily  . atorvastatin  40 mg Oral q1800  . bisacodyl  10 mg Oral Daily   Or  . bisacodyl  10 mg Rectal Daily  . citalopram  20 mg Oral Daily  . darbepoetin (ARANESP) injection - DIALYSIS  40 mcg Intravenous Q Mon-HD  . docusate sodium  200 mg Oral Daily  . dronabinol  2.5 mg Oral BID AC  . erythromycin  250 mg Oral TID  . feeding supplement (GLUCERNA SHAKE)  237 mL Oral TID BM  . insulin aspart  0-5 Units Subcutaneous QHS  . insulin aspart  0-9 Units Subcutaneous TID WC  . isosorbide-hydrALAZINE  1.5 tablet Oral Q8H  . mouth rinse  15 mL Mouth Rinse BID  . metoCLOPramide (REGLAN) injection  5 mg Intravenous Q6H  . pantoprazole  40 mg Oral BID  . sodium chloride flush  10-40 mL Intracatheter Q12H  . sodium chloride flush  3 mL Intravenous Q12H  . warfarin  4 mg Oral ONCE-1800  . Warfarin - Pharmacist Dosing Inpatient   Does not apply q1800   . sodium chloride    . sodium chloride 10 mL/hr at 03/09/18 0800   hydrALAZINE, LORazepam, LORazepam, ondansetron (ZOFRAN) IV, oxyCODONE, promethazine, sodium chloride flush, sodium chloride flush, traMADol  Exam:  no distress  no jvd  chest cta bilat  cor lvad hum  abd soft ntnd no asictes  ext trace edema  RUA AVG minimal bruit   Home meds:  - norvasc 2.5/ hydralazine 37.28m tid  - ecasa/ lipitor/ sildenafil 20 tid/ coumadin 5 hs  - PPI/ reglan 10 tid/ ativan prn/ marinol prn  - lantus 10 u qd/ novolog 5 u tid  Dialysis: MWF [facility]  4h 150m 3K/2Ca  R AVG (minimal bruit due to lvad, but patent)  Hep none  dry wt outpt = 80kg (batteries included) // dry wt inpatient = 76.8kg (no battery)  - calc 0.5 ug tiw  - mircera 50 q  2, last 6/26  - venofer 50 weekly        Impression: 1  LVAD thrombosis - sp redo LVAD 02/28/18 2  N/V - on po marinol/ erythromycin/ bid ppi, and also IV reglan, improving 3  ESRD on HD: mwf using R arm AVG (minimal bruit / thrill due to continuous flow of LVAD units). HD tomorrow.  4  Anemia ckd - Hb 8.5- 10 range, got darbe 40 ug on 7/8 yesterday 5  MBD ckd - cont meds 6  Volume - inpatient dry wt is not same as OP dry wt (see above). Will UF to 76.8kg w/ hd tomorrow.  7  HTN - on norvasc and bidil at home, just on bidil here (per cardiology)  Plan - as above   RoKelly SplinterD [phone] 03/13/2018, 12:15 PM   Recent Labs  Lab 03/07/18 1600 03/08/18 0506  03/12/18 0459 03/13/18  0430  NA 139 134*   < > 140 140  K 4.0 4.1   < > 3.7 3.6  CL 100 101   < > 102 99  CO2 30 27   < > 28 30  GLUCOSE 175* 165*   < > 116* 79  BUN 8 15   < > 28* 12  CREATININE 2.57* 3.76*   < > 6.80* 4.66*  CALCIUM 7.7* 7.6*   < > 8.2* 8.1*  PHOS 2.0* 2.6  --   --   --   ALBUMIN 2.5* 2.4*  --   --   --   INR  --  1.54   < > 2.83 2.98   < > = values in this interval not displayed.   No results for input(s): AST, ALT, ALKPHOS, BILITOT, PROT in the last 168 hours. Recent Labs  Lab 03/12/18 0459 03/13/18 0430  WBC 16.0* 13.1*  NEUTROABS 14.1* 9.9*  HGB 8.6* 8.7*  HCT 27.2* 27.6*  MCV 91.6 92.9  PLT 427* 417*   Iron/TIBC/Ferritin/ %Sat    Component Value Date/Time   IRON 40 (L) 11/02/2017 0016   TIBC 188 (L) 11/02/2017 0016   FERRITIN 523 (H) 11/02/2017 0016   IRONPCTSAT 21 11/02/2017 0016

## 2018-03-13 NOTE — Progress Notes (Signed)
LVAD Coordinator Rounding Note:  Admitted 02/28/18 from the ED for 0 Flow from ED. Pt was taken emergently to the OR for outflow graft twist/obstruction.  HM III LVAD implanted on 06/12/17 by Dr. Prescott Gum under Destination Therapy criteria due to renal insufficiency. Pump exchange on 03/01/18 for outflow graft twist/obstruction.   Pt sitting on side of the bed attempting to eat breakfast.   Vital signs: Temp: 98.5 HR:  104 Doppler Pressure:  142 Auto BP:  151/96 (111) O2 Sat: 94% on RA Wt: 176>166>193>180>185>184>190>189>187>188>176>174 lbs  LVAD interrogation reveals:  Speed: 5500 Flow: 4.2 Power: 4.3w PI: 3.3 Alarms: none  Events:  5 Hematocrit: 28 Fixed speed: 5500 Low speed limit: 5200  Drive Line: Gauze dressing dry and intact; anchor intact and accurately applied. Existing VAD dressing removed and site care performed using sterile technique. Drive line exit site cleaned with Chlora prep applicators x 2, allowed to dry, and gauzedressing with silver stripre-applied. Exit site healingand unincorporated, the velour is fully implanted at exit site. Stitch intact. Moderate amount of bloody drainage with no redness, tenderness, foul odor or rash noted.Drive line anchorsintactx 2.      Removed iodoform from oldleft abdominaldriveline site and repacked with 1" iodoform using sterile technique. Covered with a 4 x 4 and tape.  Labs:  LDH trend: 384>253>241>304>217>207>197>206>201>213  INR trend: 1.70>3.07>4.13>2.37>1.95>1.68>1.54>1.48>2.83>2.98  Blood Products: Intra-op: 03/01/18 - 5 PRBCs     2 PLT     2 FFP     2 Cryo  Gtts: Milrinone 0.125 mcg/kg/min (stopped 03/07/18) Levo 12 mcg/min (stopped 03/04/18) Neo 25 mcg/min (stopped 03/03/18) Epi 1 mcg/min (stopped 03/03/18)  Anticoagulation Plan: -INR Goal: 2.0 - 3.0 -ASA Dose: 81 mg daily   Device: -St Jude dual  -Therapies: currently off - Monitor: on VT 150 bpm  Adverse Events on VAD: - ESRD post LVAD;  started CVVH 06/15/17; HD started 06/20/17 - Hospitalization 11/5 - 07/16/17 > gastroparesis diagnosis after EGD - Hospitalization 11/26 - 08/04/17 > gastroparesis - Hospitalization 1/8 - 09/16/17>gastroparesis - referred to Dr. Corliss Parish at [facility] - 10/31/17>rightupper arm arteriovenous graft with Artegraft; ligation of right first stage brachial vein transposition  - 12/11/13 elective admission for thrombectomy for AV graft in RUA  - 12/20/17 > intractable N/V - 01/19/18 > intractable N/V sent by EMS from dialysis center - 03/01/18 > outflow graft twist/occulsion requiring pump exchange   Plan/Recommendations:  1. Dressing change with old DL site packed daily by VAD Coordinator, Nurse Davonna Belling, or trained caregiver.  2. Please page VAD coordinator with any equipment questions or concerns. 3. Please mobilize pt. 4. Pt had RAMP today increased speed to 5500, order placed.  [name] RN, VAD Coordinator 24/7 VAD Pager: [phone]

## 2018-03-13 NOTE — Progress Notes (Signed)
  Echocardiogram 2D Echocardiogram has been performed.  [employer] G Aries Kasa 03/13/2018, 10:06 AM

## 2018-03-13 NOTE — Progress Notes (Signed)
Physical Therapy Treatment Patient Details Name: [name] MRN: [id number] DOB: 1965/12/18 Today's Date: 03/13/2018    History of Present Illness Pt is a 52 y.o. male with h/o LVAD placement, admitted to ED due to low flow alarm. CT of chest showed twisted outlfow graft with possible thrombus within the graft.  He was taken emergently to OR and underwent VAD exchange. Pt requiring CRRT during hospitalization, changed to HD on 03/07/18.   PMH includes ESRD on HD, DM, ischemic cardiomyopathy, HTN, gastroparesis, DVT, Chron's disease, AICD.    PT Comments    Pt with improved mobility and willingness to participate in therapy today. Pt able to transition power to battery without assist and walk long hall distance today with rollator. Pt requires assist to rise from surface and all VSS with HR 104 with gait. Pt reports he feels he can manage at home with children and states fatigue and weakness yesterday were unusual for him post HD. Will continue to follow and if pt maintains current function then home is appropriate.   HR 95-104 BP 101/89 (95) Flow 4.4, speed 5500, PI 3.3, power 4.4    Follow Up Recommendations  Home health PT;Supervision/Assistance - 24 hour     Equipment Recommendations  None recommended by PT    Recommendations for Other Services       Precautions / Restrictions Precautions Precautions: Sternal;Fall Precaution Comments: LVAD    Mobility  Bed Mobility Overal bed mobility: Modified Independent Bed Mobility: Supine to Sit     Supine to sit: HOB elevated     General bed mobility comments: HOB 35 degrees, cues for sequence to maintain precautions  Transfers Overall transfer level: Needs assistance   Transfers: Sit to/from Stand Sit to Stand: Min assist         General transfer comment: min assist to rise from sitting with cues for hand placement  Ambulation/Gait Ambulation/Gait assistance: Modified independent (Device/Increase time) Gait  Distance (Feet): 500 Feet Assistive device: 4-wheeled walker Gait Pattern/deviations: Step-through pattern;Decreased stride length   Gait velocity interpretation: 1.31 - 2.62 ft/sec, indicative of limited community ambulator General Gait Details: slow, steady gait with rollator   Stairs             Wheelchair Mobility    Modified Rankin (Stroke Patients Only)       Balance Overall balance assessment: Mild deficits observed, not formally tested   Sitting balance-[name]: Good       Standing balance-[name]: Poor Standing balance comment: UE support on rollator                            Cognition Arousal/Alertness: Awake/alert Behavior During Therapy: Flat affect Overall Cognitive Status: Impaired/Different from baseline Area of Impairment: Memory                     Memory: Decreased recall of precautions         General Comments: pt unable to state sternal precautions      Exercises General Exercises - Lower Extremity Long Arc Quad: AROM;15 reps;Seated;Both Hip Flexion/Marching: AROM;15 reps;Seated;Both    General Comments        Pertinent Vitals/Pain Pain Assessment: No/denies pain    Home Living                      Prior Function            PT Goals (current goals  can now be found in the care plan section) Progress towards PT goals: Progressing toward goals    Frequency           PT Plan Discharge plan needs to be updated    Co-evaluation              AM-PAC PT "6 Clicks" Daily Activity  Outcome Measure  Difficulty turning over in bed (including adjusting bedclothes, sheets and blankets)?: A Little Difficulty moving from lying on back to sitting on the side of the bed? : A Little Difficulty sitting down on and standing up from a chair with arms (e.g., wheelchair, bedside commode, etc,.)?: Unable Help needed moving to and from a bed to chair (including a wheelchair)?: A Little Help  needed walking in hospital room?: A Little Help needed climbing 3-5 steps with a railing? : A Lot 6 Click Score: 15    End of Session   Activity Tolerance: Patient tolerated treatment well Patient left: in chair;with call bell/phone within reach Nurse Communication: Mobility status PT Visit Diagnosis: Other abnormalities of gait and mobility (R26.89);Muscle weakness (generalized) (M62.81);Difficulty in walking, not elsewhere classified (R26.2)     Time: 8875-7972 PT Time Calculation (min) (ACUTE ONLY): 32 min  Charges:  $Gait Training: 8-22 mins $Therapeutic Activity: 8-22 mins                    G Codes:       [name], PT [phone]    [place] 03/13/2018, 12:14 PM

## 2018-03-13 NOTE — Progress Notes (Signed)
13 Days Post-Op Procedure(s) (LRB): TRANSESOPHAGEAL ECHOCARDIOGRAM (TEE) (N/A) REDO INSERTION OF IMPLANTABLE LEFT VENTRICULAR ASSIST DEVICE - HEARTMATE 3 (N/A) Subjective: Feels better at VAD sped 5500 rpm CT chest , abdomen reviewed- everything healing after   HM3 VAD exchange. Outflow graft well oriented Objective: Vital signs in last 24 hours: Temp:  [97.8 F (36.6 C)-99 F (37.2 C)] 98.5 F (36.9 C) (07/09 0700) Pulse Rate:  [96-114] 103 (07/09 0700) Cardiac Rhythm: Sinus tachycardia (07/09 0700) Resp:  [14-20] 16 (07/09 0700) BP: (93-152)/(64-106) 151/96 (07/09 0700) SpO2:  [92 %-99 %] 94 % (07/09 0700) Weight:  [174 lb 14.4 oz (79.3 kg)-176 lb 5.9 oz (80 kg)] 174 lb 14.4 oz (79.3 kg) (07/09 0300)  Hemodynamic parameters for last 24 hours:  stable  Intake/Output from previous day: 07/08 0701 - 07/09 0700 In: -  Out: 1500  Intake/Output this shift: Total I/O In: 240 [P.O.:240] Out: -        Exam    General- alert and comfortable    Neck- no JVD, no cervical adenopathy palpable, no carotid bruit   Lungs- clear without rales, wheezes   Cor- regular rate , normal VAD hum   Abdomen- soft, non-tender   Extremities - warm, non-tender, minimal edema   Neuro- oriented, appropriate, no focal weakness   Lab Results: Recent Labs    03/12/18 0459 03/13/18 0430  WBC 16.0* 13.1*  HGB 8.6* 8.7*  HCT 27.2* 27.6*  PLT 427* 417*   BMET:  Recent Labs    03/12/18 0459 03/13/18 0430  NA 140 140  K 3.7 3.6  CL 102 99  CO2 28 30  GLUCOSE 116* 79  BUN 28* 12  CREATININE 6.80* 4.66*  CALCIUM 8.2* 8.1*    PT/INR:  Recent Labs    03/13/18 0430  LABPROT 30.7*  INR 2.98   ABG    Component Value Date/Time   PHART 7.326 (L) 03/02/2018 1635   HCO3 23.6 03/02/2018 1635   TCO2 25 03/02/2018 1635   ACIDBASEDEF 3.0 (H) 03/02/2018 1635   O2SAT 62.1 03/09/2018 0340   CBG (last 3)  Recent Labs    03/12/18 1722 03/12/18 2141 03/13/18 0801  GLUCAP 108* 91 140*     Assessment/Plan: S/P Procedure(s) (LRB): TRANSESOPHAGEAL ECHOCARDIOGRAM (TEE) (N/A) REDO INSERTION OF IMPLANTABLE LEFT VENTRICULAR ASSIST DEVICE - HEARTMATE 3 (N/A) Cont to pack old driveline tunnel daily WBC improved 13k   LOS: 13 days    [name] 03/13/2018

## 2018-03-13 NOTE — Progress Notes (Addendum)
ANTICOAGULATION CONSULT NOTE - Follow Up  Consult  Pharmacy Consult for warfarin, heparin Indication: LVAD  Allergies  Allergen Reactions  . Metformin And Related Diarrhea    Patient Measurements: Height: 5' 8"  (172.7 cm) Weight: 174 lb 14.4 oz (79.3 kg) IBW/kg (Calculated) : 68.4  Vital Signs: Temp: 98.5 F (36.9 C) (07/09 0700) Temp Source: Oral (07/09 0700) BP: 151/96 (07/09 0700) Pulse Rate: 103 (07/09 0700)  Labs: Recent Labs    03/10/18 0850 03/10/18 1825 03/11/18 0400 03/12/18 0459 03/13/18 0430  HGB  --   --   --  8.6* 8.7*  HCT  --   --   --  27.2* 27.6*  PLT  --   --   --  427* 417*  LABPROT 17.7*  --  22.2* 29.5* 30.7*  INR 1.47  --  1.96 2.83 2.98  HEPARINUNFRC 0.20* 0.39 0.42  --   --   CREATININE 4.03*  --  5.22* 6.80* 4.66*    Estimated Creatinine Clearance: 17.7 mL/min (A) (by C-G formula based on SCr of 4.66 mg/dL (H)).   Medical History: Past Medical History:  Diagnosis Date  . Angina   . ASCVD (arteriosclerotic cardiovascular disease)    , Anterior infarction 2005, LAD diagonal bifurcation intervention 03/2004  . Automatic implantable cardiac defibrillator -St. Jude's       . Benign neoplasm of colon   . CHF (congestive heart failure) ([place])   . Chronic systolic heart failure ([place])   . Coronary artery disease     Widely patent previously placed stents in the left anterior   . Crohn's disease ([place])   . 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 Dialysis patient ([place])   . Dyspnea   . Gastroparesis   . GERD (gastroesophageal reflux disease)   . High cholesterol   . Hyperlipidemia   . Hypersomnolent    Previous diagnosis of narcolepsy  . Hypertension, essential   . Ischemic cardiomyopathy    Ejection fraction 15-20% catheterization 2010  . Type II or unspecified type diabetes mellitus without mention of complication, not stated as uncontrolled   . Unspecified gastritis and gastroduodenitis without mention of hemorrhage      Assessment: 47 yom on warfarin PTA for LVAD/factor V leiden who is now s/p LVAD change out for pump thrombus. Previous warfarin regimen for goal 2-3 is 5 mg daily except 2.5 mg on Friday.    INR on admission was 1.24. Today INR increased slightly from 2.83 to 2.98, still within goal range. Hgb 8.7, plt 417, LDH stable at 213. No s/sx of bleeding. Still minimal oral intake documented but nausea has improved. Will increase dose slightly from yesterday despite being on higher end of range since INR increase seems like it peaked and received lower than home dose last two days.  Restarted on erythromycin, which can impact INR sensitivity, for gastroparesis.      Goal of Therapy:  INR 2-3 Monitor platelets by anticoagulation protocol: Yes   Plan:  Will order warfarin 4 mg tonight Daily INR, CBC, and for s/sx of bleeding.  Doylene Canard, PharmD Clinical Pharmacist  Pager: [phone] Phone: [phone] 03/13/2018 7:59 AM

## 2018-03-13 NOTE — Progress Notes (Signed)
PT Cancellation Note  Patient Details Name: [name] MRN: [id number] DOB: 11-01-1965   Cancelled Treatment:    Reason Eval/Treat Not Completed: Medical issues which prohibited therapy(currently with MAP 140 and await meds prior to reattempting)   Kimoni Pickerill B Matvey [place] 03/13/2018, 7:51 AM  [name], [place]

## 2018-03-13 NOTE — Progress Notes (Addendum)
Inpatient Rehabilitation-Admissions Coordinator   [facility] met with pt at the bedside as follow up from PM&R consult. While recent recommendations were for CIR, pt has shown improvement since last therapy session -now ambulating 500 feet with Modified [place]. Pt is no longer a candidate for CIR. Pt did state that he would like to continue with cardiac rehab. At this time, [facility] will sign off. Please re-consult if there is a functional decline.   Jhonnie Garner, OTR/L  Rehab Admissions Coordinator  [phone] 03/13/2018 3:28 PM

## 2018-03-14 ENCOUNTER — Inpatient Hospital Stay (HOSPITAL_COMMUNITY): Payer: BLUE CROSS/BLUE SHIELD

## 2018-03-14 ENCOUNTER — Ambulatory Visit (HOSPITAL_COMMUNITY): Payer: Self-pay

## 2018-03-14 LAB — CBC WITH DIFFERENTIAL/PLATELET
Abs Immature Granulocytes: 0.1 10*3/uL (ref 0.0–0.1)
BASOS ABS: 0.1 10*3/uL (ref 0.0–0.1)
Basophils Relative: 0 %
EOS ABS: 0.3 10*3/uL (ref 0.0–0.7)
EOS PCT: 2 %
HEMATOCRIT: 26 % — AB (ref 39.0–52.0)
HEMOGLOBIN: 8.2 g/dL — AB (ref 13.0–17.0)
Immature Granulocytes: 1 %
LYMPHS PCT: 7 %
Lymphs Abs: 1.1 10*3/uL (ref 0.7–4.0)
MCH: 29.4 pg (ref 26.0–34.0)
MCHC: 31.5 g/dL (ref 30.0–36.0)
MCV: 93.2 fL (ref 78.0–100.0)
Monocytes Absolute: 1 10*3/uL (ref 0.1–1.0)
Monocytes Relative: 6 %
Neutro Abs: 14.5 10*3/uL — ABNORMAL HIGH (ref 1.7–7.7)
Neutrophils Relative %: 84 %
Platelets: 406 10*3/uL — ABNORMAL HIGH (ref 150–400)
RBC: 2.79 MIL/uL — AB (ref 4.22–5.81)
RDW: 16.1 % — AB (ref 11.5–15.5)
WBC: 17.1 10*3/uL — AB (ref 4.0–10.5)

## 2018-03-14 LAB — BASIC METABOLIC PANEL
Anion gap: 12 (ref 5–15)
BUN: 18 mg/dL (ref 6–20)
CHLORIDE: 95 mmol/L — AB (ref 98–111)
CO2: 29 mmol/L (ref 22–32)
CREATININE: 6.62 mg/dL — AB (ref 0.61–1.24)
Calcium: 8.1 mg/dL — ABNORMAL LOW (ref 8.9–10.3)
GFR calc Af Amer: 10 mL/min — ABNORMAL LOW (ref >60)
GFR calc non Af Amer: 9 mL/min — ABNORMAL LOW (ref >60)
Glucose, Bld: 131 mg/dL — ABNORMAL HIGH (ref 70–99)
Potassium: 3.5 mmol/L (ref 3.5–5.1)
SODIUM: 136 mmol/L (ref 135–145)

## 2018-03-14 LAB — GLUCOSE, CAPILLARY
GLUCOSE-CAPILLARY: 121 mg/dL — AB (ref 70–99)
Glucose-Capillary: 101 mg/dL — ABNORMAL HIGH (ref 70–99)
Glucose-Capillary: 132 mg/dL — ABNORMAL HIGH (ref 70–99)
Glucose-Capillary: 161 mg/dL — ABNORMAL HIGH (ref 70–99)

## 2018-03-14 LAB — PROTIME-INR
INR: 2.88
Prothrombin Time: 30 seconds — ABNORMAL HIGH (ref 11.4–15.2)

## 2018-03-14 LAB — LACTATE DEHYDROGENASE: LDH: 212 U/L — AB (ref 98–192)

## 2018-03-14 MED ORDER — VANCOMYCIN HCL 10 G IV SOLR
1500.0000 mg | Freq: Once | INTRAVENOUS | Status: AC
  Administered 2018-03-14: 1500 mg via INTRAVENOUS
  Filled 2018-03-14: qty 1500

## 2018-03-14 MED ORDER — VANCOMYCIN HCL IN DEXTROSE 750-5 MG/150ML-% IV SOLN
750.0000 mg | INTRAVENOUS | Status: DC
  Administered 2018-03-16 – 2018-03-19 (×2): 750 mg via INTRAVENOUS
  Filled 2018-03-14 (×2): qty 150

## 2018-03-14 MED ORDER — WARFARIN SODIUM 4 MG PO TABS
4.0000 mg | ORAL_TABLET | Freq: Once | ORAL | Status: AC
  Administered 2018-03-14: 4 mg via ORAL
  Filled 2018-03-14: qty 1

## 2018-03-14 MED ORDER — NEPRO/CARBSTEADY PO LIQD
237.0000 mL | Freq: Three times a day (TID) | ORAL | Status: DC
  Administered 2018-03-14 – 2018-03-18 (×14): 237 mL via ORAL
  Filled 2018-03-14 (×18): qty 237

## 2018-03-14 NOTE — Progress Notes (Addendum)
Called by RN staff. Pt with fever 100.7, SOB, and rigors. O2 sats upper 90s. Breath sounds clear. CXR ordered. Redraw blood cultures through central line, then DC central line. Vancomycin per pharmacy ordered. Will reassess need for line for blood draws tomorrow - could possibly do a-line.   Georgiana Shore, NP

## 2018-03-14 NOTE — Care Management (Signed)
03/14/2018 CM communicated copay to pt - pt denied hardship with copay.    03/13/18 CM submitted benefit check for Bidil - CM provided information to HF team.    BIDIL 20-37.5 MG TAB TID ISOSORBIDE-HYDRALAZINE   COVER- YES  CO-PAY- $ 100.00 FOR 90 DAY SUPPLY M/O ONLY  THRU [facility] RX # [phone]  TIER- 3 DRUG  PRIOR APPROVAL- NO   PREFERRED PHARMACY : YES  [facility] M/O # [phone]  ( CANNOT USE RETAIL PHCY )   CM contacted [facility] is a mail order system, once prescription is called in to [phone] it will take up to 48 hours to process and then 1-2 days to ship medication directly to pt home address.  HF team to arrange samples for pt until mail order arrives. CM will continue to follow for discharge needs

## 2018-03-14 NOTE — Progress Notes (Signed)
14 Days Post-Op Procedure(s) (LRB): TRANSESOPHAGEAL ECHOCARDIOGRAM (TEE) (N/A) REDO INSERTION OF IMPLANTABLE LEFT VENTRICULAR ASSIST DEVICE - HEARTMATE 3 (N/A) Subjective: Nausea better today White count back up to 17,000-blood cultures remain negative.  Patient remains afebrile.  Surgical incisions clean and dry.  Old driveline tunnel being daily packed.  CT scan of chest-abdomen shows no evidence of infection.  VAD performance improved with blood pressure control and increased speed  Objective: Vital signs in last 24 hours: Temp:  [97.3 F (36.3 C)-98.2 F (36.8 C)] 98.2 F (36.8 C) (07/10 0710) Pulse Rate:  [86-104] 103 (07/10 1030) Cardiac Rhythm: Normal sinus rhythm (07/10 0730) Resp:  [16-20] 18 (07/10 1030) BP: (80-116)/(51-89) 86/55 (07/10 1015) SpO2:  [93 %-98 %] 93 % (07/10 0710) Weight:  [175 lb 14.8 oz (79.8 kg)-176 lb 12.9 oz (80.2 kg)] 176 lb 12.9 oz (80.2 kg) (07/10 0710)  Hemodynamic parameters for last 24 hours:    Intake/Output from previous day: 07/09 0701 - 07/10 0700 In: 1070 [P.O.:1070] Out: -  Intake/Output this shift: No intake/output data recorded.       Exam    General- alert and comfortable    Neck- no JVD, no cervical adenopathy palpable, no carotid bruit   Lungs- clear without rales, wheezes   Cor- regular rate and rhythm, normal VAD hum   Abdomen- soft, non-tender   Extremities - warm, non-tender, minimal edema   Neuro- oriented, appropriate, no focal weakness   Lab Results: Recent Labs    03/13/18 0430 03/14/18 0449  WBC 13.1* 17.1*  HGB 8.7* 8.2*  HCT 27.6* 26.0*  PLT 417* 406*   BMET:  Recent Labs    03/13/18 0430 03/14/18 0449  NA 140 136  K 3.6 3.5  CL 99 95*  CO2 30 29  GLUCOSE 79 131*  BUN 12 18  CREATININE 4.66* 6.62*  CALCIUM 8.1* 8.1*    PT/INR:  Recent Labs    03/14/18 0449  LABPROT 30.0*  INR 2.88   ABG    Component Value Date/Time   PHART 7.326 (L) 03/02/2018 1635   HCO3 23.6 03/02/2018 1635   TCO2 25 03/02/2018 1635   ACIDBASEDEF 3.0 (H) 03/02/2018 1635   O2SAT 62.1 03/09/2018 0340   CBG (last 3)  Recent Labs    03/13/18 1630 03/13/18 2139 03/14/18 0802  GLUCAP 118* 143* 101*    Assessment/Plan: S/P Procedure(s) (LRB): TRANSESOPHAGEAL ECHOCARDIOGRAM (TEE) (N/A) REDO INSERTION OF IMPLANTABLE LEFT VENTRICULAR ASSIST DEVICE - HEARTMATE 3 (N/A) Continue current care Follow white count daily CBC   [name] 03/14/2018

## 2018-03-14 NOTE — Progress Notes (Addendum)
Patient ID: [name], male   DOB: [birth year], 52 y.o.   MRN: [id number]     Advanced Heart Failure Rounding Note  PCP-Cardiologist: No primary care provider on file.   Subjective:    CVVHD stopped 03/05/18.   Tolerating iHD well (MWF). ICD turned back on 7/8  RAMP echo 7/9 yesterday speed increased to 5500  MAPs improved 80-90s  Much more alert this morning. Nausea okay yesterday. He was able to eat some. Walked with PT yesterday. Denies cough/fever/chills. Denies bleeding.   WBC trending back up 17.1. He is afebrile.   LDH stable 212, INR 2.88, Hemoglobin 8.7 -> 8.2.   CT chest/abd 03/13/18: 1. No specific explanation for fever. 2. Cluster of gas bubbles in the left anterior pleura or mediastinum, expected after recent surgery. 3. Atelectasis and trace effusions. 4. Good opacification of outflow cannula after interval revision. 5. Packed wound in the left abdominal wall without superimposed fluid collection or cellulitis.  LVAD Interrogation HM 3: Speed: 5500 Flow: 4.2 PI: 3.3 Power: 4.  13 PI events/24 hrs. VAD interrogated personally. Parameters stable.   Objective:   Weight Range: 175 lb 14.8 oz (79.8 kg) Body mass index is 26.75 kg/m.   Vital Signs:   Temp:  [97.3 F (36.3 C)-98.2 F (36.8 C)] 97.9 F (36.6 C) (07/10 0300) Pulse Rate:  [90-104] 98 (07/10 0300) Resp:  [16-20] 19 (07/10 0300) BP: (90-107)/(72-89) 107/75 (07/10 0300) SpO2:  [95 %-98 %] 97 % (07/10 0300) Weight:  [175 lb 14.8 oz (79.8 kg)] 175 lb 14.8 oz (79.8 kg) (07/10 0300) Last BM Date: 03/11/18  MAPs 80-90   Weight change: Filed Weights   03/12/18 1130 03/13/18 0300 03/14/18 0300  Weight: 176 lb 5.9 oz (80 kg) 174 lb 14.4 oz (79.3 kg) 175 lb 14.8 oz (79.8 kg)   Intake/Output:   Intake/Output Summary (Last 24 hours) at 03/14/2018 0732 Last data filed at 03/13/2018 1900 Gross per 24 hour  Intake 1070 ml  Output -  Net 1070 ml    Physical Exam   General: NAD.  HEENT:  Normal. Neck: Supple, JVP 7-8 cm. Carotids OK. Left IJ TLC CDI Cardiac:  Mechanical heart sounds with LVAD hum present. Sternal incision CDI, pacing wires in place Lungs:  CTAB, normal effort.  Abdomen:  NT, ND, no HSM. No bruits or masses. +BS  LVAD exit site: . Dressing dry and intact. No erythema or drainage. Stabilization device present and accurately applied.  Extremities:  Warm and dry. No cyanosis, clubbing, rash, or edema.  Neuro:  Alert & oriented x 3. Cranial nerves grossly intact. Moves all 4 extremities w/o difficulty. Affect pleasant     Telemetry   NSR 90-100s. Personally reviewed.   Labs    CBC Recent Labs    03/13/18 0430 03/14/18 0449  WBC 13.1* 17.1*  NEUTROABS 9.9* 14.5*  HGB 8.7* 8.2*  HCT 27.6* 26.0*  MCV 92.9 93.2  PLT 417* 623*   Basic Metabolic Panel Recent Labs    03/13/18 0430 03/14/18 0449  NA 140 136  K 3.6 3.5  CL 99 95*  CO2 30 29  GLUCOSE 79 131*  BUN 12 18  CREATININE 4.66* 6.62*  CALCIUM 8.1* 8.1*   Liver Function Tests No results for input(s): AST, ALT, ALKPHOS, BILITOT, PROT, ALBUMIN in the last 72 hours. No results for input(s): LIPASE, AMYLASE in the last 72 hours. Cardiac Enzymes No results for input(s): CKTOTAL, CKMB, CKMBINDEX, TROPONINI in the last 72 hours.  BNP: BNP (  last 3 results) Recent Labs    07/03/17 0037 03/02/18 0400 03/08/18 0008  BNP 758.9* 694.1* 1,576.6*    ProBNP (last 3 results) No results for input(s): PROBNP in the last 8760 hours.   D-Dimer No results for input(s): DDIMER in the last 72 hours. Hemoglobin A1C No results for input(s): HGBA1C in the last 72 hours. Fasting Lipid Panel No results for input(s): CHOL, HDL, LDLCALC, TRIG, CHOLHDL, LDLDIRECT in the last 72 hours. Thyroid Function Tests No results for input(s): TSH, T4TOTAL, T3FREE, THYROIDAB in the last 72 hours.  Invalid input(s): FREET3  Other results:   Imaging    No results found.   Medications:     Scheduled  Medications: . aspirin EC  81 mg Oral Daily  . atorvastatin  40 mg Oral q1800  . bisacodyl  10 mg Oral Daily   Or  . bisacodyl  10 mg Rectal Daily  . Chlorhexidine Gluconate Cloth  6 each Topical Q0600  . citalopram  20 mg Oral Daily  . darbepoetin (ARANESP) injection - DIALYSIS  40 mcg Intravenous Q Mon-HD  . docusate sodium  200 mg Oral Daily  . dronabinol  2.5 mg Oral BID AC  . erythromycin  250 mg Oral TID  . feeding supplement (GLUCERNA SHAKE)  237 mL Oral TID BM  . insulin aspart  0-5 Units Subcutaneous QHS  . insulin aspart  0-9 Units Subcutaneous TID WC  . isosorbide-hydrALAZINE  1.5 tablet Oral Q8H  . mouth rinse  15 mL Mouth Rinse BID  . metoCLOPramide (REGLAN) injection  5 mg Intravenous Q6H  . pantoprazole  40 mg Oral BID  . sodium chloride flush  10-40 mL Intracatheter Q12H  . sodium chloride flush  3 mL Intravenous Q12H  . Warfarin - Pharmacist Dosing Inpatient   Does not apply q1800    Infusions: . sodium chloride    . 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 Post VAD course complicated by ESRD and severe DM gastroparesis. Admitted with low/no-flow alarms due to LVAD outflow graft kink (does not have bend clip).   Assessment/Plan   1. Cardiogenic shock due to LVAD complication with LVAD outflow graft thrombosis: s/p emergent LVAD pump exchange 6/27 for pump thrombosis in setting of LVAD graft kink (did not have bend clip).  Volume looks okay.  - Control volume with HD.  - Off milrinone, stable. No change.  2. VAD: s/p pump exchange 2/2 HM3 graft kink.  Has HM3.  MAP 80-90 - VAD interrogated personally. Parameters stable.  - Continue coumadin per pharmacy.  Continue ASA  81. INR 2.88. - LDH stable 212 - Continue Bidil 1.5 tabs tid, take on non-HD days.  3. Intractable Nausea/Vomiting secondary to gastroparesis: Amlodipine stopped. Improved yesterday. Able to eat some.  - Continue reglan, phenergan, and ativan as needed - Continue home Marinol.  - Continue erythromycin 250 mg TID a day for gastroparesis  He has been seen by Dr Derrill Kay on 01/24/18 [facility] gastroparesis specialist) 4. ESRD: CVVHD stopped 03/05/18. Tolerating iHD now, gets MWF.  - Getting HD today.  No change.  5. DM,insulin dependent - SSI for now. No change.  6. Acute respiratory failure: Extubated 6/28  Stable on [place].  -  Up to chair and mobilize. As tolerated. PT/OT now recommending HH PT.  7. HTN:  - Amlodipine caused gastroparesis symptoms.  - MAPs improved 80-90. Continue bidil 1.5 tab TID.  8. Leukocytosis - WBC trending back up 17.1. Afebrile. Denies cough - Blood cultures NGTD - CT chest/abd with no source of infection 9. Anemia: No overt bleeding.  Follow closely. Hemoglobin 8.2 this am.  10. Deconditioning  - PT/OT following. Did much better yesterday.  Now recommending [facility].   I reviewed the LVAD parameters from today, and compared the results to the patient's prior recorded data.  No programming changes were made.  The LVAD is functioning within specified parameters.  The patient performs LVAD self-test daily.  LVAD interrogation was negative for any significant power changes, alarms or PI events/speed drops.  LVAD equipment check completed and is in good working order.  Back-up equipment present.   LVAD education done on emergency procedures and precautions and reviewed exit site care.   Length of Stay: [address], NP  03/14/2018, 7:32 AM  Advanced Heart Failure Team Pager [phone] (M-F; 7a - 4p)  Please contact [facility] for night-coverage after hours (4p -7a ) and weekends on [email or web address]  Patient seen with NP, agree with the above note. Nausea now better, eating.   Walked well with PT yesterday.  INR therapeutic.  MAP improved on current Bidil.  Getting HD this morning. WBCs are higher at 17 but afebrile.  Ramp echo yesterday with speed increased to 5500 rpm.   On exam, normal LVAD sounds.  JVP not elevated, no peripheral edema.   WBCs elevated but no fever.  Cultures NGTD.  No abx.     Continue PT.   MAP controlled now on Bidil to 1.5 tabs tid.  Amlodipine stopped due to concern it made gastroparesis worse.    Gastroparesis symptoms better.   He will need pacing wires out prior to discharge.   If he remains stable and WBCs trend down, should be able to go home Thursday or Friday.   Cora Stetson 03/14/2018 8:24 AM

## 2018-03-14 NOTE — Progress Notes (Signed)
CSW met at bedside with patient who was in good spirits and stated he might be discharged home soon. Patient spoke of his recovery and very positive about his future and possible transplant in the near future. CSW provided encouragement and supportive intervention. Raquel Sarna, [place], [place]

## 2018-03-14 NOTE — Progress Notes (Signed)
[facility] Progress Note  Subjective: 3L off on HD this am, per pt no cramping .  BP's stable  Vitals:   03/14/18 1115 03/14/18 1130 03/14/18 1145 03/14/18 1230  BP: (!) 86/63 (!) 89/76 (!) 78/64 102/72  Pulse: (!) 104 (!) 102 100 100  Resp: 18 18 18 20   Temp:    99.9 F (37.7 C)  TempSrc:    Oral  SpO2:    94%  Weight:    77.4 kg (170 lb 10.2 oz)  Height:        Inpatient medications: . aspirin EC  81 mg Oral Daily  . atorvastatin  40 mg Oral q1800  . bisacodyl  10 mg Oral Daily   Or  . bisacodyl  10 mg Rectal Daily  . Chlorhexidine Gluconate Cloth  6 each Topical Q0600  . citalopram  20 mg Oral Daily  . darbepoetin (ARANESP) injection - DIALYSIS  40 mcg Intravenous Q Mon-HD  . docusate sodium  200 mg Oral Daily  . dronabinol  2.5 mg Oral BID AC  . erythromycin  250 mg Oral TID  . feeding supplement (NEPRO CARB STEADY)  237 mL Oral TID BM  . insulin aspart  0-5 Units Subcutaneous QHS  . insulin aspart  0-9 Units Subcutaneous TID WC  . isosorbide-hydrALAZINE  1.5 tablet Oral Q8H  . mouth rinse  15 mL Mouth Rinse BID  . metoCLOPramide (REGLAN) injection  5 mg Intravenous Q6H  . pantoprazole  40 mg Oral BID  . sodium chloride flush  10-40 mL Intracatheter Q12H  . sodium chloride flush  3 mL Intravenous Q12H  . warfarin  4 mg Oral ONCE-1800  . Warfarin - Pharmacist Dosing Inpatient   Does not apply q1800   . sodium chloride    . sodium chloride 10 mL/hr at 03/09/18 0800   hydrALAZINE, LORazepam, LORazepam, ondansetron (ZOFRAN) IV, oxyCODONE, promethazine, sodium chloride flush, sodium chloride flush, traMADol  Exam:  no distress  no jvd  chest cta bilat  cor lvad hum  abd soft ntnd no asictes  ext trace edema  RUA AVG minimal bruit   Home meds:  - norvasc 2.5/ hydralazine 37.13m tid  - ecasa/ lipitor/ sildenafil 20 tid/ coumadin 5 hs  - PPI/ reglan 10 tid/ ativan prn/ marinol prn  - lantus 10 u qd/ novolog 5 u tid  Dialysis: MWF [facility]  4h  181m 3K/2Ca  R AVG (minimal bruit due to lvad, but patent) Hep none  dry wt outpt = 80kg (batteries included) // dry wt inpatient = 76.8kg (no battery)  - calc 0.5 ug tiw  - mircera 50 q  2, last 6/26  - venofer 50 weekly        Impression: 1  LVAD thrombosis - sp redo LVAD 02/28/18 2  N/V - norvasc dc'd. Getting on po marinol/ erythromycin/ bid ppi, and IV reglan, improving 3  ESRD on HD: mwf using R arm AVG (minimal bruit / thrill due to continuous LVAD bloodflow). HD today  4  Anemia ckd - Hb 8.5- 10 range, got darbe 40 ug on 7/8 5  MBD ckd - cont meds 6  Volume - dialyzed close to dry wt (inpt dry wt = 76.8kg) today 7  HTN - norvasc caused gastroparesis and has been stopped per cardiology; on bidil alone now  Plan - as above   [facility]Kelly Splinter[facility] [place] [phone] 03/14/2018, 3:07 PM   Recent Labs  Lab 03/07/18 1600  03/08/18 0506  03/13/18 0430 03/14/18 0449  NA 139 134*   < > 140 136  K 4.0 4.1   < > 3.6 3.5  CL 100 101   < > 99 95*  CO2 30 27   < > 30 29  GLUCOSE 175* 165*   < > 79 131*  BUN 8 15   < > 12 18  CREATININE 2.57* 3.76*   < > 4.66* 6.62*  CALCIUM 7.7* 7.6*   < > 8.1* 8.1*  PHOS 2.0* 2.6  --   --   --   ALBUMIN 2.5* 2.4*  --   --   --   INR  --  1.54   < > 2.98 2.88   < > = values in this interval not displayed.   No results for input(s): AST, ALT, ALKPHOS, BILITOT, PROT in the last 168 hours. Recent Labs  Lab 03/13/18 0430 03/14/18 0449  WBC 13.1* 17.1*  NEUTROABS 9.9* 14.5*  HGB 8.7* 8.2*  HCT 27.6* 26.0*  MCV 92.9 93.2  PLT 417* 406*   Iron/TIBC/Ferritin/ %Sat    Component Value Date/Time   IRON 40 (L) 11/02/2017 0016   TIBC 188 (L) 11/02/2017 0016   FERRITIN 523 (H) 11/02/2017 0016   IRONPCTSAT 21 11/02/2017 0016

## 2018-03-14 NOTE — Discharge Summary (Signed)
Advanced Heart Failure Team  Discharge Summary   Patient ID: [name] MRN: [id number], DOB/AGE: 53-Apr-1967 53 y.o. Admit date: 02/28/2018 D/C date:     03/19/2018   Primary Discharge Diagnoses:  1. Cardiogenic shock due to LVAD complication with LVAD outflow graft thrombosis - S/p emergent pump exchange 03/01/18 2. LVAD - HM3 - INR goal 2-3 on coumadin + ASA - VAD speed 5500 3. Intractable nausea/vomiting secondary to gastroparesis 4. ESRD - HD MWF 5. DM 6. Acute respiratory failure 7. HTN 8. Leukocytosis 9. Anemia 10. Deconditioning - HH PT ordered for discharge.    Hospital Course: [name] is a 52 year old with a history of CAD and ischemic cardiomyopathy now s/p Heartmate 3 LVADon 10/18. Post VAD course complicated byESRD and severe DM gastroparesis.Admitted with low/no-flow alarms due to LVAD outflow graft kink (does not have bend clip).  He underwent emergent VAD exchange on 03/01/18. He was extubated on POD 1. He was gradually weaned from pressors and inotropes. He required CVVHD post op, but was transtioned back to [facility] without problems. Course complicated by leukocytosis and fever. CT abd/chest negative for infection and blood cultures were negative. Post op pain well controlled with rare tramadol. Able to tolerate diet. Evaluated by PT/OT and HH PT recommended for discharge. Ramp echo completed prior to discharge with speed optimization. Resumed 81 mg ASA + coumadin with INR goal 2-3. Discharge delayed by recurrent fever and leukocytosis. Blood cultures remained negative.   1. Cardiogenic shock due toLVAD complication with LVAD outflow graft thrombosis: s/p emergent LVAD pump exchange 6/27 for pump thrombosis in setting of LVAD graft kink (did not have bend clip).  - Volume managed with HD. He required CVVHD post-op through 7/1.   - Tolerated milrinone wean with no problems. 2. VAD: s/p pump exchange 2/2 HM3 graft kink.  Has HM3.   - VAD parameters remained  stable.  - On coumadin and ASA with INR goal 2-3. Monitored and managed by pharmacy.  - LDH monitored and remained stable.  - Ramp completed 7/9. Speed increased to 5500. 3.Intractable Nausea/Vomiting secondary to gastroparesis:  - Continue reglan, phenergan, and ativan as needed - Continue home Marinol.  - Received erythromycin 250 mg TID while inpatient. He has beenseen byDr Derrill Kay on 01/24/18 [facility] gastroparesis specialist) - This worsened with addition of amlodipine and improved after stopping.  4. ESRD:  - CVVHD stopped 03/05/18.  - Tolerated IHD. On MWF schedule. Will resume outpatient HD. Received HD prior to discharge on 7/15. 5. DM,insulin dependent - Monitored and managed with SSI. 6. Acute respiratory failure:  - Extubated 6/28. Weaned to RA.  7. HTN:  - Amlodipine caused worsening gastroparesis symptoms, so it was stopped.  - Started on Bidil 1.5 tabs TID on non-HD days. He will receive Bidil through [facility] mail order (called in 03/15/18). Bidil samples provided. - Sildenafil stopped with addition of bidil.  8. Leukocytosis - WBC peaked at 18. He became febrile 7/10 pm with tmax 100.7. Repeat blood cultures were negative. CXR negative. He was given Vanc empirically and central line was discontinued. Febrile again 7/11 pm with WBC 15, so discharge was delayed. Vanc continued. WBC on day of discharge 12.0. He will need a CBC at follow up.  - Blood cultures NGTD.  - CT chest/abd with no source of infection 9. Anemia:  - Monitored closely. No overt bleeding. He received 1 unit PRBCs day prior to discharge. Hemoglobin 8.7 on day of discharge. Will need a CBC at  follow up.  10. Deconditioning - PT recommending HHPT. Orders placed.   He will be follows closely in VAD clinic, with appointment as below. He will check his PT/INR at home on Thursday. HH PT will follow him outpatient. He should receive bidil in the mail this week. Samples provided.   LVAD Interrogation HM II:   Speed:  5500     Flow: 4.3     PI: 3.3     Power: 5.0       Back-up speed: 5200      Discharge Weight: 176 lbs Discharge Vitals: Blood pressure (!) 84/57, pulse 94, temperature 99.5 F (37.5 C), temperature source Oral, resp. rate (!) 28, height 5' 8"  (1.727 m), weight 176 lb 5.9 oz (80 kg), SpO2 100 %.  Labs: Lab Results  Component Value Date   WBC 12.0 (H) 03/19/2018   HGB 8.7 (L) 03/19/2018   HCT 26.9 (L) 03/19/2018   MCV 89.7 03/19/2018   PLT 332 03/19/2018    Recent Labs  Lab 03/19/18 0243  NA 135  K 4.3  CL 98  CO2 25  BUN 47*  CREATININE 7.86*  CALCIUM 8.4*  GLUCOSE 152*   Lab Results  Component Value Date   CHOL 128 05/04/2017   HDL 50 05/04/2017   LDLCALC 54 05/04/2017   TRIG 118 05/04/2017   BNP (last 3 results) Recent Labs    03/02/18 0400 03/08/18 0008 03/15/18 0106  BNP 694.1* 1,576.6* 811.3*    ProBNP (last 3 results) No results for input(s): PROBNP in the last 8760 hours.   Diagnostic Studies/Procedures   No results found.  Discharge Medications   Allergies as of 03/19/2018      Reactions   Metformin And Related Diarrhea      Medication List    STOP taking these medications   amLODipine 2.5 MG tablet Commonly known as:  NORVASC   hydrALAZINE 25 MG tablet Commonly known as:  APRESOLINE   sildenafil 20 MG tablet Commonly known as:  REVATIO     TAKE these medications   aspirin EC 81 MG tablet Take 1 tablet (81 mg total) by mouth daily.   atorvastatin 40 MG tablet Commonly known as:  LIPITOR Take 1 tablet (40 mg total) by mouth daily at 6 PM.   citalopram 20 MG tablet Commonly known as:  CELEXA Take 1 tablet (20 mg total) by mouth daily.   docusate sodium 100 MG capsule Commonly known as:  COLACE Take 2 capsules (200 mg total) by mouth daily. What changed:    how much to take  when to take this   dronabinol 2.5 MG capsule Commonly known as:  MARINOL Take 1 capsule (2.5 mg total) by mouth 2 (two) times daily before lunch  and supper.   insulin aspart 100 UNIT/ML injection Commonly known as:  novoLOG Inject 5 Units into the skin 3 (three) times daily with meals.   insulin glargine 100 UNIT/ML injection Commonly known as:  LANTUS Inject 0.1 mLs (10 Units total) into the skin daily.   isosorbide-hydrALAZINE 20-37.5 MG tablet Commonly known as:  BIDIL Take 1.5 tablets by mouth every 8 (eight) hours. Take 1.5 tablets every 8 hours on non dialysis days.   LORazepam 0.5 MG tablet Commonly known as:  ATIVAN Take 1 tablet (0.5 mg total) by mouth every 12 (twelve) hours as needed (nausea). What changed:  reasons to take this   metoCLOPramide 10 MG tablet Commonly known as:  REGLAN Take 1 tablet (10 mg  total) by mouth 3 (three) times daily.   ondansetron 4 MG disintegrating tablet Commonly known as:  ZOFRAN ODT Take 1 tablet (4 mg total) by mouth every 6 (six) hours as needed for nausea or vomiting.   pantoprazole 40 MG tablet Commonly known as:  PROTONIX Take 40 mg by mouth 2 (two) times daily.   promethazine 12.5 MG tablet Commonly known as:  PHENERGAN Take 1 tablet (12.5 mg total) by mouth every 6 (six) hours as needed for nausea or vomiting.   warfarin 5 MG tablet Commonly known as:  COUMADIN Take as directed. If you are unsure how to take this medication, talk to your nurse or doctor. Original instructions:  Take 1 tablet (5 mg total) by mouth daily at 6 PM. Take 5 mg (1 tab) daily except on Tuesday/Friday, take 2.5 mg (1/2 tab). What changed:  additional instructions            Durable Medical Equipment  (From admission, onward)        Start     Ordered   03/14/18 1017  Heart failure home health orders  (Heart failure home health orders / Face to face)  Once    Comments:  Heart Failure Follow-up Care:  Verify follow-up appointments per Patient Discharge Instructions. Confirm transportation arranged. Reconcile home medications with discharge medication list. Remove discontinued  medications from use. Assist patient/caregiver to manage medications using pill box. Reinforce low sodium food selection Assessments: Vital signs and oxygen saturation at each visit. Assess home environment for safety concerns, caregiver support and availability of low-sodium foods. Consult Education officer, museum, PT/OT, Dietitian, and CNA based on assessments. Perform comprehensive cardiopulmonary assessment. Notify MD for any change in condition or weight gain of 3 pounds in one day or 5 pounds in one week with symptoms. Daily Weights and Symptom Monitoring: Ensure patient has access to scales. Teach patient/caregiver to weigh daily before breakfast and after voiding using same scale and record.    Teach patient/caregiver to track weight and symptoms and when to notify Provider. Activity: Develop individualized activity plan with patient/caregiver.   Question Answer Comment  Heart Failure Follow-up Care Advanced Heart Failure (AHF) [facility] at [phone]   Obtain the following labs Other see comments   Lab frequency Other see comments   Fax lab results to AHF Clinic at [phone]   Diet Low Sodium Heart Healthy   Fluid restrictions: 2000 mL Fluid      03/14/18 1018      Disposition   The patient will be discharged in stable condition to home. Discharge Instructions    (HEART FAILURE PATIENTS) Call MD:  Anytime you have any of the following symptoms: 1) 3 pound weight gain in 24 hours or 5 pounds in 1 week 2) shortness of breath, with or without a dry hacking cough 3) swelling in the hands, feet or stomach 4) if you have to sleep on extra pillows at night in order to breathe.   Complete by:  As directed    Amb Referral to Cardiac Rehabilitation   Complete by:  As directed    Diagnosis:  Heart Failure (see criteria below if ordering Phase II)   Heart Failure Type:  Chronic Systolic Comment - LVAD pump exchange   Call MD for:  persistant nausea and vomiting   Complete by:  As directed     Call MD for:  redness, tenderness, or signs of infection (pain, swelling, redness, odor or green/yellow discharge around incision site)   Complete by:  As directed    Call MD for:  severe uncontrolled pain   Complete by:  As directed    Call MD for:  temperature >100.4   Complete by:  As directed    Diet - low sodium heart healthy   Complete by:  As directed    Discharge instructions   Complete by:  As directed    Check PT/INR on Thursday.   Heart Failure patients record your daily weight using the same scale at the same time of day   Complete by:  As directed    INR  Goal: 2 - 3   Complete by:  As directed    Goal:  2 - 3   Increase activity slowly   Complete by:  As directed    Page VAD Coordinator at [phone]  Notify for: any VAD alarms, sustained elevations of power >10 watts, sustained drop in Pulse Index <3   Complete by:  As directed    Notify for:   any VAD alarms sustained elevations of power >10 watts sustained drop in Pulse Index <3     Speed Settings:   Complete by:  As directed    Fixed 5500 RPM Low 5200 RPM     Follow-up Information    [facility] [place] Follow up on 03/27/2018.   Specialty:  Cardiology Why:  VAD clinic follow up. 10 am. Garage code [address] information: [id number] [place] [phone]            Duration of Discharge Encounter: Greater than 35 minutes   Signed, Georgiana [place], NP  03/19/2018, 11:56 AM

## 2018-03-14 NOTE — Progress Notes (Signed)
Complained of shortness of breath, lungs diminished breath sound  On bil lower lobes. Placed on O2 3l [place] , pulse ox-99%. Temp-100.7 orally. Became anxious ativan iv given.. NP made aware seen pt at once.  Portable chest x-ray done. Central line  removed as ordered.. Started on vancomycin. Claimed to feel better after 1 hour. Continue to monitor.

## 2018-03-14 NOTE — Progress Notes (Signed)
Pharmacy Antibiotic Note  [name] is a 52 y.o. male s/p VAD exchange (6/27) with fever Pharmacy has been consulted for vancomycin dosing. He is noted with ESRD on HD MWF (last HD 03/14/18; BFR 400 for 4.25hrs) -post-op antibiotics stopped 7/2  Plan: -Vancomycin 1500 mg IV x1 followed by 757m IV post HD -Will follow cultures and clinical progress   Height: 5' 8"  (172.7 cm) Weight: 170 lb 10.2 oz (77.4 kg)(stand up scale) IBW/kg (Calculated) : 68.4  Temp (24hrs), Avg:98.7 F (37.1 C), Min:97.3 F (36.3 C), Max:100.7 F (38.2 C)  Recent Labs  Lab 03/09/18 0346  03/09/18 1532 03/10/18 0850 03/11/18 0400 03/12/18 0459 03/13/18 0430 03/14/18 0449  WBC 11.4*  --  14.8*  --   --  16.0* 13.1* 17.1*  CREATININE  --    < >  --  4.03* 5.22* 6.80* 4.66* 6.62*   < > = values in this interval not displayed.    Estimated Creatinine Clearance: 12.5 mL/min (A) (by C-G formula based on SCr of 6.62 mg/dL (H)).    Allergies  Allergen Reactions  . Metformin And Related Diarrhea    Antimicrobials this admission: 7/8 vanc  Dose adjustments this admission:   Microbiology results: 7/8 blood x2- ngtd  Thank you for allowing pharmacy to be a part of this patient's care.  AHildred Laser PharmD Clinical Pharmacist Please check Amion for pharmacy contact number 03/14/2018 5:05 PM

## 2018-03-14 NOTE — Progress Notes (Signed)
LVAD Coordinator Rounding Note:  Admitted 02/28/18 from the ED for 0 Flow from ED. Pt was taken emergently to the OR for outflow graft twist/obstruction.  HM III LVAD implanted on 06/12/17 by Dr. Prescott Gum under Destination Therapy criteria due to renal insufficiency. Pump exchange on 03/01/18 for outflow graft twist/obstruction.   Pt getting up with PT - acute SOB with sats maintaining 98%. Pt had dialysis this am and says he sometimes gets this way with chronic dialysis. Dr. Aundra Dubin and Darrick Grinder, NP updated.   Vital signs: Temp: 99.9 HR:  101 Doppler Pressure:  Not done Auto BP:  88/72 O2 Sat: 94% on RA Wt: 176>166>193>180>185>184>190>189>187>188>176>174>170 lbs  LVAD interrogation reveals:  Speed: 5500 Flow: 4.3 Power: 4.4w PI: 2.3 Alarms: none  Events:  none Hematocrit: 28 Fixed speed: 5500 Low speed limit: 5200  Drive Line: Gauze dressing dry and intact; anchor intact and accurately applied. Existing VAD dressing removed and site care performed using sterile technique. Drive line exit site cleaned with Chlora prep applicators x 2, allowed to dry, and gauzedressing with silver stripre-applied. Exit site healingand unincorporated, the velour is fully implanted at exit site. Stitch intact. Moderate amount of bloody drainage with no redness, tenderness, foul odor or rash noted.Drive line anchorsintactx 2.  Removed iodoform from oldleft abdominaldriveline site and repacked with 1" iodoform using sterile technique. Covered with a 4 x 4 and tape.  CT sutures removed. Pacing wires intact.  Labs:  LDH trend: 384>253>241>304>217>207>197>206>201>213>212  INR trend: 1.70>3.07>4.13>2.37>1.95>1.68>1.54>1.48>2.83>2.98>2.88  Blood Products: Intra-op: 03/01/18 - 5 PRBCs     2 PLT     2 FFP     2 Cryo  Gtts: Milrinone 0.125 mcg/kg/min (stopped 03/07/18) Levo 12 mcg/min (stopped 03/04/18) Neo 25 mcg/min (stopped 03/03/18) Epi 1 mcg/min (stopped 03/03/18)  Anticoagulation  Plan: -INR Goal: 2.0 - 3.0 -ASA Dose: 81 mg daily   Device: -St Jude dual  -Therapies: currently off - Monitor: on VT 150 bpm  Adverse Events on VAD: - ESRD post LVAD; started CVVH 06/15/17; HD started 06/20/17 - Hospitalization 11/5 - 07/16/17 > gastroparesis diagnosis after EGD - Hospitalization 11/26 - 08/04/17 > gastroparesis - Hospitalization 1/8 - 09/16/17>gastroparesis - referred to Dr. Corliss Parish at [facility] - 10/31/17>rightupper arm arteriovenous graft with Artegraft; ligation of right first stage brachial vein transposition  - 12/11/13 elective admission for thrombectomy for AV graft in RUA  - 12/20/17 > intractable N/V - 01/19/18 > intractable N/V sent by EMS from dialysis center - 03/01/18 > outflow graft twist/occulsion requiring pump exchange   Plan/Recommendations:  1. Dressing change with old DL site packed daily by VAD Coordinator, Nurse Davonna Belling, or trained caregiver.  2. Please page VAD coordinator with any equipment questions or concerns. 3. Please mobilize pt.  Zada Girt RN, VAD Coordinator 24/7 VAD Pager: [phone]

## 2018-03-14 NOTE — Care Management Note (Addendum)
Case Management Note  Patient Details  Name: EIVIN MASCIO MRN: [id number] Date of Birth: [birth year]  Subjective/Objective:    Pt admitted with LVAD pump thrombus                Action/Plan:   PTA independent from home.  Pt in agreement with HHPT - pt chose [facility] - agency accepted referral and HF orders.  Pt informed CM that his family will provide 24 hour supervision.   Expected Discharge Date:                  Expected Discharge Plan:  IP Rehab Facility  In-House Referral:  Clinical Social Work  Discharge planning Services  CM Consult  Post Acute Care Choice:    Choice offered to:  Patient  DME Arranged:    DME Agency:     HH Arranged:  PT HH Agency:     Status of Service:  In process, will continue to follow  If discussed at Long Length of Stay Meetings, dates discussed:    Additional Comments:  Maryclare Labrador, RN 03/14/2018, 4:35 PM

## 2018-03-14 NOTE — Progress Notes (Signed)
Occupational Therapy Treatment Patient Details Name: [name] MRN: [id number] DOB: 1965-11-18 Today's Date: 03/14/2018    History of present illness Pt is a 52 y.o. male with h/o LVAD placement, admitted to ED due to low flow alarm. CT of chest showed twisted outlfow graft with possible thrombus within the graft.  He was taken emergently to OR and underwent VAD exchange. Pt requiring CRRT during hospitalization, changed to HD on 03/07/18.   PMH includes ESRD on HD, DM, ischemic cardiomyopathy, HTN, gastroparesis, DVT, Chron's disease, AICD.   OT comments  Pt more fatigued with shortness of breath, but Sp02 95-98% on RA following HD. Pt requiring min assist to stand from bed and min guard for toileting and standing grooming. Pt managing LVAD equipment with min assist to set up.   Follow Up Recommendations  No OT follow up    Equipment Recommendations  None recommended by OT    Recommendations for Other Services      Precautions / Restrictions Precautions Precautions: Sternal;Fall Precaution Comments: pt able to state and adhere to sternal precautions       Mobility Bed Mobility Overal bed mobility: Modified Independent                Transfers Overall transfer level: Needs assistance Equipment used: Rolling walker (2 wheeled) Transfers: Sit to/from Stand Sit to Stand: Min assist         General transfer comment: pt self cueing hands on knees    Balance     Sitting balance-[name]: Good       Standing balance-[name]: Fair Standing balance comment: can release walker in static standing                           ADL either performed or assessed with clinical judgement   ADL Overall ADL's : Needs assistance/impaired     Grooming: Wash/dry hands;Standing;Min guard           Upper Body Dressing : Minimal assistance;Sitting Upper Body Dressing Details (indicate cue type and reason): front opening gown and LVAD vest      Toilet Transfer: Min guard;Ambulation;RW   Toileting- Water quality scientist and Hygiene: Min guard;Sit to/from stand       Functional mobility during ADLs: Min guard;Rolling walker General ADL Comments: pt more SOB following HD, but Sp02 95-98%     Vision       Perception     Praxis      Cognition Arousal/Alertness: Awake/alert Behavior During Therapy: WFL for tasks assessed/performed Overall Cognitive Status: Within Functional Limits for tasks assessed                                          Exercises     Shoulder Instructions       General Comments      Pertinent Vitals/ Pain       Pain Assessment: No/denies pain  Home Living                                          Prior Functioning/Environment              Frequency  Min 2X/week        Progress Toward Goals  OT Goals(current goals  can now be found in the care plan section)  Progress towards OT goals: Not progressing toward goals - comment(pt more fatigued following HD)  Acute Rehab OT Goals Patient Stated Goal: to be alive for his 71 month old grandson OT Goal Formulation: With patient Time For Goal Achievement: 03/17/18 Potential to Achieve Goals: Good  Plan Discharge plan remains appropriate    Co-evaluation                 AM-PAC PT "6 Clicks" Daily Activity     Outcome Measure   Help from another person eating meals?: None Help from another person taking care of personal grooming?: A Little Help from another person toileting, which includes using toliet, bedpan, or urinal?: A Little Help from another person bathing (including washing, rinsing, drying)?: A Little Help from another person to put on and taking off regular upper body clothing?: A Little Help from another person to put on and taking off regular lower body clothing?: A Little 6 Click Score: 19    End of Session    OT Visit Diagnosis: Unsteadiness on feet (R26.81);Muscle  weakness (generalized) (M62.81)   Activity Tolerance Patient limited by fatigue   Patient Left in bed;with call bell/phone within reach   Nurse Communication          Time: 4944-7395 OT Time Calculation (min): 15 min  Charges: OT General Charges $OT Visit: 1 Visit OT Treatments $Self Care/Home Management : 8-22 mins  03/14/2018 [name], OTR/L Pager: [phone]   Werner Lean Haze Boyden 03/14/2018, 3:36 PM

## 2018-03-14 NOTE — Progress Notes (Signed)
ANTICOAGULATION CONSULT NOTE - Follow Up  Consult  Pharmacy Consult for warfarin, heparin Indication: LVAD  Allergies  Allergen Reactions  . Metformin And Related Diarrhea    Patient Measurements: Height: 5' 8"  (172.7 cm) Weight: 176 lb 12.9 oz (80.2 kg)(bed scale) IBW/kg (Calculated) : 68.4  Vital Signs: Temp: 98.2 F (36.8 C) (07/10 0710) Temp Source: Oral (07/10 0710) BP: 116/77 (07/10 0845) Pulse Rate: 102 (07/10 0845)  Labs: Recent Labs    03/12/18 0459 03/13/18 0430 03/14/18 0449  HGB 8.6* 8.7* 8.2*  HCT 27.2* 27.6* 26.0*  PLT 427* 417* 406*  LABPROT 29.5* 30.7* 30.0*  INR 2.83 2.98 2.88  CREATININE 6.80* 4.66* 6.62*    Estimated Creatinine Clearance: 12.5 mL/min (A) (by C-G formula based on SCr of 6.62 mg/dL (H)).   Medical History: Past Medical History:  Diagnosis Date  . Angina   . ASCVD (arteriosclerotic cardiovascular disease)    , Anterior infarction 2005, LAD diagonal bifurcation intervention 03/2004  . Automatic implantable cardiac defibrillator -St. Jude's       . Benign neoplasm of colon   . CHF (congestive heart failure) ([place])   . Chronic systolic heart failure ([place])   . Coronary artery disease     Widely patent previously placed stents in the left anterior   . Crohn's disease ([place])   . 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 Dialysis patient ([place])   . Dyspnea   . Gastroparesis   . GERD (gastroesophageal reflux disease)   . High cholesterol   . Hyperlipidemia   . Hypersomnolent    Previous diagnosis of narcolepsy  . Hypertension, essential   . Ischemic cardiomyopathy    Ejection fraction 15-20% catheterization 2010  . Type II or unspecified type diabetes mellitus without mention of complication, not stated as uncontrolled   . Unspecified gastritis and gastroduodenitis without mention of hemorrhage     Assessment: 62 yom on warfarin PTA for LVAD/factor V leiden who is now s/p LVAD change out for pump thrombus.  Previous warfarin regimen for goal 2-3 is 5 mg daily except 2.5 mg on Friday.    INR on admission was 1.24. Today INR increased slightly from 2.98 to 2.88, still within goal range. Hgb 8.2, plt 406, LDH stable at 212. No s/sx of bleeding. Minimal oral intake documented at 40% but nausea has improved.   Restarted on erythromycin, which can impact INR sensitivity, for gastroparesis.      Goal of Therapy:  INR 2-3 Monitor platelets by anticoagulation protocol: Yes   Plan:  Will order warfarin 4 mg again tonight Daily INR, CBC, and for s/sx of bleeding.  Doylene Canard, PharmD Clinical Pharmacist  Pager: [phone] Phone: [phone] 03/14/2018 8:53 AM

## 2018-03-15 LAB — CBC
HCT: 31.6 % — ABNORMAL LOW (ref 39.0–52.0)
Hemoglobin: 9.8 g/dL — ABNORMAL LOW (ref 13.0–17.0)
MCH: 28.7 pg (ref 26.0–34.0)
MCHC: 31 g/dL (ref 30.0–36.0)
MCV: 92.7 fL (ref 78.0–100.0)
Platelets: 404 10*3/uL — ABNORMAL HIGH (ref 150–400)
RBC: 3.41 MIL/uL — ABNORMAL LOW (ref 4.22–5.81)
RDW: 15.9 % — AB (ref 11.5–15.5)
WBC: 18.1 10*3/uL — ABNORMAL HIGH (ref 4.0–10.5)

## 2018-03-15 LAB — BASIC METABOLIC PANEL
Anion gap: 8 (ref 5–15)
BUN: 12 mg/dL (ref 6–20)
CO2: 28 mmol/L (ref 22–32)
CREATININE: 4.26 mg/dL — AB (ref 0.61–1.24)
Calcium: 7.6 mg/dL — ABNORMAL LOW (ref 8.9–10.3)
Chloride: 101 mmol/L (ref 98–111)
GFR calc Af Amer: 17 mL/min — ABNORMAL LOW (ref >60)
GFR, EST NON AFRICAN AMERICAN: 15 mL/min — AB (ref >60)
Glucose, Bld: 126 mg/dL — ABNORMAL HIGH (ref 70–99)
POTASSIUM: 3.9 mmol/L (ref 3.5–5.1)
SODIUM: 137 mmol/L (ref 135–145)

## 2018-03-15 LAB — CBC WITH DIFFERENTIAL/PLATELET
ABS IMMATURE GRANULOCYTES: 0.1 10*3/uL (ref 0.0–0.1)
BASOS ABS: 0 10*3/uL (ref 0.0–0.1)
Basophils Relative: 0 %
Eosinophils Absolute: 0.4 10*3/uL (ref 0.0–0.7)
Eosinophils Relative: 2 %
HCT: 25 % — ABNORMAL LOW (ref 39.0–52.0)
Hemoglobin: 7.8 g/dL — ABNORMAL LOW (ref 13.0–17.0)
Immature Granulocytes: 1 %
Lymphocytes Relative: 10 %
Lymphs Abs: 1.6 10*3/uL (ref 0.7–4.0)
MCH: 28.8 pg (ref 26.0–34.0)
MCHC: 31.2 g/dL (ref 30.0–36.0)
MCV: 92.3 fL (ref 78.0–100.0)
MONO ABS: 1.1 10*3/uL — AB (ref 0.1–1.0)
Monocytes Relative: 7 %
NEUTROS ABS: 12.1 10*3/uL — AB (ref 1.7–7.7)
Neutrophils Relative %: 80 %
PLATELETS: 365 10*3/uL (ref 150–400)
RBC: 2.71 MIL/uL — AB (ref 4.22–5.81)
RDW: 15.8 % — ABNORMAL HIGH (ref 11.5–15.5)
WBC: 15.2 10*3/uL — AB (ref 4.0–10.5)

## 2018-03-15 LAB — PROCALCITONIN: Procalcitonin: 0.58 ng/mL

## 2018-03-15 LAB — LACTATE DEHYDROGENASE: LDH: 213 U/L — ABNORMAL HIGH (ref 98–192)

## 2018-03-15 LAB — PROTIME-INR
INR: 2.18
Prothrombin Time: 24.1 seconds — ABNORMAL HIGH (ref 11.4–15.2)

## 2018-03-15 LAB — PREPARE RBC (CROSSMATCH)

## 2018-03-15 LAB — GLUCOSE, CAPILLARY
GLUCOSE-CAPILLARY: 137 mg/dL — AB (ref 70–99)
GLUCOSE-CAPILLARY: 159 mg/dL — AB (ref 70–99)
Glucose-Capillary: 183 mg/dL — ABNORMAL HIGH (ref 70–99)
Glucose-Capillary: 112 mg/dL — ABNORMAL HIGH (ref 70–99)

## 2018-03-15 LAB — BRAIN NATRIURETIC PEPTIDE: B Natriuretic Peptide: 811.3 pg/mL — ABNORMAL HIGH (ref 0.0–100.0)

## 2018-03-15 MED ORDER — WARFARIN SODIUM 5 MG PO TABS
5.0000 mg | ORAL_TABLET | Freq: Once | ORAL | Status: AC
  Administered 2018-03-15: 5 mg via ORAL
  Filled 2018-03-15: qty 1

## 2018-03-15 MED ORDER — CHLORHEXIDINE GLUCONATE CLOTH 2 % EX PADS
6.0000 | MEDICATED_PAD | Freq: Every day | CUTANEOUS | Status: DC
Start: 2018-03-16 — End: 2018-03-15

## 2018-03-15 MED ORDER — SODIUM CHLORIDE 0.9% IV SOLUTION
Freq: Once | INTRAVENOUS | Status: AC
  Administered 2018-03-15: 12:00:00 via INTRAVENOUS

## 2018-03-15 NOTE — Progress Notes (Signed)
CARDIAC REHAB PHASE I   PRE:  Rate/Rhythm: 103 ST  BP:  Sitting: 102/72 (83)      SaO2: 98 RA  MODE:  Ambulation: 200 ft   POST:  Rate/Rhythm: 121 ST with PVCs  BP:  Sitting: 103/65 (78)    SaO2: 95 RA   Pt ambulated 246f in hallway standby assist with rollator. Upon return, pt c/o feeling "tired". Denies SOB, just keeps saying "tired". RN in room. Will continue to follow.  [id number] Falco RN BSN 03/15/2018 10:33 AM

## 2018-03-15 NOTE — Progress Notes (Signed)
LVAD Coordinator Rounding Note:  Admitted 02/28/18 from the ED for 0 Flow from ED. [name] was taken emergently to the OR for outflow graft twist/obstruction.  HM III LVAD implanted on 06/12/17 by Dr. Prescott Gum under Destination Therapy criteria due to renal insufficiency. Pump exchange on 03/01/18 for outflow graft twist/obstruction.   [name] had a fever yesterday afternoon  Vital signs: Temp: 99 HR:  95 Doppler Pressure:  100 Auto BP:  89/74 O2 Sat: 98% on RA Wt: 176>166>193>180>185>184>190>189>187>188>176>174>170>173 lbs  LVAD interrogation reveals:  Speed: 5500 Flow: 4.2 Power: 4.7w PI: 3.3 Alarms: none  Events:  none Hematocrit: 28 Fixed speed: 5500 Low speed limit: 5200  Drive Line: Gauze dressing dry and intact; anchor intact and accurately applied. VAD coordinator will do the dressing change and packing with pts daughter Margreta Journey today at 1p.  Labs:  LDH trend: 384>253>241>304>217>207>197>206>201>213>212>213  INR trend: 1.70>3.07>4.13>2.37>1.95>1.68>1.54>1.48>2.83>2.98>2.88>2.18  Blood Products: Intra-op: 03/01/18 - 5 PRBCs     2 PLT     2 FFP     2 Cryo  Gtts: Milrinone 0.125 mcg/kg/min (stopped 03/07/18) Levo 12 mcg/min (stopped 03/04/18) Neo 25 mcg/min (stopped 03/03/18) Epi 1 mcg/min (stopped 03/03/18)  Anticoagulation Plan: -INR Goal: 2.0 - 3.0 -ASA Dose: 81 mg daily   Device: -St Jude dual  -Therapies: currently off - Monitor: on VT 150 bpm  Adverse Events on VAD: - ESRD post LVAD; started CVVH 06/15/17; HD started 06/20/17 - Hospitalization 11/5 - 07/16/17 > gastroparesis diagnosis after EGD - Hospitalization 11/26 - 08/04/17 > gastroparesis - Hospitalization 1/8 - 09/16/17>gastroparesis - referred to Dr. Corliss Parish at [facility] - 10/31/17>rightupper arm arteriovenous graft with Artegraft; ligation of right first stage brachial vein transposition  - 12/11/13 elective admission for thrombectomy for AV graft in RUA  - 12/20/17 > intractable N/V - 01/19/18 > intractable  N/V sent by EMS from dialysis center - 03/01/18 > outflow graft twist/occulsion requiring pump exchange   Plan/Recommendations:  1. Dressing change with old DL site packed daily by VAD Coordinator, Nurse Davonna Belling, or trained caregiver.  2. Please page VAD coordinator with any equipment questions or concerns.  [name] RN, VAD Coordinator 24/7 VAD Pager: [phone]

## 2018-03-15 NOTE — Progress Notes (Signed)
Physical Therapy Treatment Patient Details Name: [name] MRN: [id number] DOB: [birth year] Today's Date: 03/15/2018    History of Present Illness Pt is a 52 y.o. male with h/o LVAD placement, admitted to ED due to low flow alarm. CT of chest showed twisted outlfow graft with possible thrombus within the graft.  He was taken emergently to OR and underwent VAD exchange. Pt requiring CRRT during hospitalization, changed to HD on 03/07/18.   PMH includes ESRD on HD, DM, ischemic cardiomyopathy, HTN, gastroparesis, DVT, Chron's disease, AICD.    PT Comments    Pt very upbeat and pleasant today. States he feels better and does not have SOB. Pt with increased ability with bed mobility, transitioned power sources independently, and able to walk hall with 1 seated rest. Pt continues to struggle with sit to stand and endurance. Attempted to perform timed 5 trials of sit to stand after gait but pt fatigued after 3 trials with assist which required 2 min. [name] encouraged to continue increasing gait distance, sit to stand trials and bil LE HEP. Will continue to follow.   HR 96-103 with gait Speed 5500, flow 4.4-4.5, PI 3.4-3.6, power 4.4    Follow Up Recommendations  Home health PT;Supervision/Assistance - 24 hour     Equipment Recommendations  None recommended by PT    Recommendations for Other Services       Precautions / Restrictions Precautions Precautions: Sternal;Fall Precaution Comments: LVAD, pt able to state 2 sternal precautions Restrictions Weight Bearing Restrictions: No    Mobility  Bed Mobility Overal bed mobility: Modified Independent Bed Mobility: Supine to Sit           General bed mobility comments: HOB 20 degrees   Transfers Overall transfer level: Needs assistance   Transfers: Sit to/from Stand Sit to Stand: Min assist         General transfer comment: cues for hand son knees, anterior translation and assist to rise= total 6  trials  Ambulation/Gait Ambulation/Gait assistance: Supervision Gait Distance (Feet): 500 Feet Assistive device: 4-wheeled walker Gait Pattern/deviations: Step-through pattern;Decreased stride length   Gait velocity interpretation: >2.62 ft/sec, indicative of community ambulatory General Gait Details: slow, steady gait with rollator seated rest at 250' with pt self regulating activity   Stairs             Wheelchair Mobility    Modified Rankin (Stroke Patients Only)       Balance Overall balance assessment: Mild deficits observed, not formally tested                                          Cognition Arousal/Alertness: Awake/alert Behavior During Therapy: WFL for tasks assessed/performed Overall Cognitive Status: Within Functional Limits for tasks assessed                       Memory: Decreased recall of precautions                Exercises General Exercises - Lower Extremity Long Arc Quad: AROM;Seated;Both;10 reps Hip Flexion/Marching: AROM;Seated;Both;10 reps    General Comments        Pertinent Vitals/Pain Pain Assessment: No/denies pain    Home Living                      Prior Function  PT Goals (current goals can now be found in the care plan section) Acute Rehab PT Goals Time For Goal Achievement: 03/29/18 Potential to Achieve Goals: Good Progress towards PT goals: Goals met and updated - see care plan    Frequency           PT Plan Current plan remains appropriate    Co-evaluation              AM-PAC PT "6 Clicks" Daily Activity  Outcome Measure  Difficulty turning over in bed (including adjusting bedclothes, sheets and blankets)?: A Little Difficulty moving from lying on back to sitting on the side of the bed? : A Little Difficulty sitting down on and standing up from a chair with arms (e.g., wheelchair, bedside commode, etc,.)?: Unable Help needed moving to and from a  bed to chair (including a wheelchair)?: A Little Help needed walking in hospital room?: A Little Help needed climbing 3-5 steps with a railing? : A Little 6 Click Score: 16    End of Session   Activity Tolerance: Patient tolerated treatment well Patient left: in chair;with call bell/phone within reach Nurse Communication: Mobility status PT Visit Diagnosis: Other abnormalities of gait and mobility (R26.89);Muscle weakness (generalized) (M62.81);Difficulty in walking, not elsewhere classified (R26.2)     Time: 2111-7356 PT Time Calculation (min) (ACUTE ONLY): 21 min  Charges:  $Gait Training: 8-22 mins                    G Codes:       [name], PT [phone]    [place] 03/15/2018, 11:13 AM

## 2018-03-15 NOTE — Progress Notes (Signed)
Dr Prescott Gum discontinued pacerwires as meet resistance when RN attempted to discontinue them. Covered with bandaid. No bleeding,

## 2018-03-15 NOTE — Progress Notes (Signed)
Gauze dressing dry and intact; anchor intact and accurately applied. Existing VAD dressing removed and site care performed using sterile technique. Drive line exit site cleaned with Chlora prep applicators x 2, allowed to dry, and gauzedressing with silver stripre-applied. Exit site healingand unincorporated, the velour is fully implanted at exit site. Stitch intact. Moderate amount of bloody drainage with no redness, tenderness, foul odor or rash noted.Drive line anchorsintactx 2.    Removed iodoform from oldleft abdominaldriveline site and repacked with 1" iodoform using sterile technique. Covered with a 4 x 4 and tape.   Daughter-[name] was educated on packing the old driveline site. She is already competed with driveline dressing.  [name] RN, BSN VAD Coordinator 24/7 Pager [phone]

## 2018-03-15 NOTE — Progress Notes (Signed)
ANTICOAGULATION CONSULT NOTE - Follow Up  Consult  Pharmacy Consult for warfarin, heparin Indication: LVAD  Allergies  Allergen Reactions  . Metformin And Related Diarrhea    Patient Measurements: Height: 5' 8"  (172.7 cm) Weight: 173 lb 8 oz (78.7 kg) IBW/kg (Calculated) : 68.4  Vital Signs: Temp: 99 F (37.2 C) (07/11 0805) Temp Source: Oral (07/11 0805) BP: 89/74 (07/11 0805) Pulse Rate: 95 (07/11 0805)  Labs: Recent Labs    03/13/18 0430 03/14/18 0449 03/15/18 0103  HGB 8.7* 8.2* 7.8*  HCT 27.6* 26.0* 25.0*  PLT 417* 406* 365  LABPROT 30.7* 30.0* 24.1*  INR 2.98 2.88 2.18  CREATININE 4.66* 6.62* 4.26*    Estimated Creatinine Clearance: 19.4 mL/min (A) (by C-G formula based on SCr of 4.26 mg/dL (H)).   Medical History: Past Medical History:  Diagnosis Date  . Angina   . ASCVD (arteriosclerotic cardiovascular disease)    , Anterior infarction 2005, LAD diagonal bifurcation intervention 03/2004  . Automatic implantable cardiac defibrillator -St. Jude's       . Benign neoplasm of colon   . CHF (congestive heart failure) ([place])   . Chronic systolic heart failure ([place])   . Coronary artery disease     Widely patent previously placed stents in the left anterior   . Crohn's disease ([place])   . 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 Dialysis patient ([place])   . Dyspnea   . Gastroparesis   . GERD (gastroesophageal reflux disease)   . High cholesterol   . Hyperlipidemia   . Hypersomnolent    Previous diagnosis of narcolepsy  . Hypertension, essential   . Ischemic cardiomyopathy    Ejection fraction 15-20% catheterization 2010  . Type II or unspecified type diabetes mellitus without mention of complication, not stated as uncontrolled   . Unspecified gastritis and gastroduodenitis without mention of hemorrhage     Assessment: 2 yom on warfarin PTA for LVAD/factor V leiden who is now s/p LVAD change out for pump thrombus. Previous warfarin  regimen for goal 2-3 is 5 mg daily except 2.5 mg on Friday.    Restarted on erythromycin, which can impact INR sensitivity, for gastroparesis.    INR peaked at 2.9 now down to 2.18 this morning. Hgb down slightly to 7.8, LDH stable in low 200s. No bleeding issues noted.     Goal of Therapy:  INR 2-3 Monitor platelets by anticoagulation protocol: Yes   Plan:  Will order warfarin 5 mg tonight Daily INR, CBC, and for s/sx of bleeding.  Erin Hearing PharmD., BCPS Clinical Pharmacist 03/15/2018 10:16 AM

## 2018-03-15 NOTE — Progress Notes (Addendum)
Patient ID: [name], male   DOB: [birth year], 52 y.o.   MRN: [id number]     Advanced Heart Failure Rounding Note  PCP-Cardiologist: No primary care provider on file.   Subjective:    CVVHD stopped 03/05/18.   Tolerating iHD well (MWF). ICD turned back on 7/8  RAMP echo 7/9 speed increased to 5500  Yesterday afternoon became febrile to 100.7 with chills and SOB. Repeat blood cultures sent and pending. CXR negative. Central line DC'd. Started on Vanc. Afebrile this morning. WBC trending back down 17 > 15. MAPs stable.   Feels okay this morning. No SOB or CP. Denies fever/chills. Denies bleeding. No nausea this morning. Getting up with PT.   LDH stable 213, INR 2.18, Hemoglobin 8.7 -> 8.2 -> 7.8.   CT chest/abd 03/13/18: 1. No specific explanation for fever. 2. Cluster of gas bubbles in the left anterior pleura or mediastinum, expected after recent surgery. 3. Atelectasis and trace effusions. 4. Good opacification of outflow cannula after interval revision. 5. Packed wound in the left abdominal wall without superimposed fluid collection or cellulitis.  LVAD Interrogation HM 3: Speed: 5500 Flow: 3.9 PI: 3.6 Power: 4.  0 PI events/24 hrs. VAD interrogated personally. Parameters stable.   Objective:   Weight Range: 173 lb 8 oz (78.7 kg) Body mass index is 26.38 kg/m.   Vital Signs:   Temp:  [98.8 F (37.1 C)-100.7 F (38.2 C)] 98.9 F (37.2 C) (07/11 0318) Pulse Rate:  [86-106] 93 (07/11 0318) Resp:  [18-20] 19 (07/11 0318) BP: (78-116)/(51-87) 114/83 (07/11 0318) SpO2:  [94 %-99 %] 98 % (07/11 0318) Weight:  [170 lb 10.2 oz (77.4 kg)-173 lb 8 oz (78.7 kg)] 173 lb 8 oz (78.7 kg) (07/11 0629) Last BM Date: 03/14/18  MAPs 70-80s   Weight change: Filed Weights   03/14/18 0710 03/14/18 1230 03/15/18 0629  Weight: 176 lb 12.9 oz (80.2 kg) 170 lb 10.2 oz (77.4 kg) 173 lb 8 oz (78.7 kg)   Intake/Output:   Intake/Output Summary (Last 24 hours) at 03/15/2018  0718 Last data filed at 03/14/2018 1225 Gross per 24 hour  Intake 120 ml  Output 3000 ml  Net -2880 ml    Physical Exam   General:  NAD.  HEENT: Normal. Neck: Supple, JVP 7-8 cm. Carotids OK.  Cardiac:  Mechanical heart sounds with LVAD hum present. Sternal incision CDI. Pacing wires present.  Lungs:  CTAB, normal effort.  Abdomen:  NT, ND, no HSM. No bruits or masses. +BS  LVAD exit site: . Dressing dry and intact. Small amount of drainage present. Stabilization device present and accurately applied.  Extremities:  Warm and dry. No cyanosis, clubbing, rash, or edema.  Neuro:  Alert & oriented x 3. Cranial nerves grossly intact. Moves all 4 extremities w/o difficulty. Affect pleasant     Telemetry   NSR 90-100s. Personally reviewed.   Labs    CBC Recent Labs    03/14/18 0449 03/15/18 0103  WBC 17.1* 15.2*  NEUTROABS 14.5* 12.1*  HGB 8.2* 7.8*  HCT 26.0* 25.0*  MCV 93.2 92.3  PLT 406* 712   Basic Metabolic Panel Recent Labs    03/14/18 0449 03/15/18 0103  NA 136 137  K 3.5 3.9  CL 95* 101  CO2 29 28  GLUCOSE 131* 126*  BUN 18 12  CREATININE 6.62* 4.26*  CALCIUM 8.1* 7.6*   Liver Function Tests No results for input(s): AST, ALT, ALKPHOS, BILITOT, PROT, ALBUMIN in the last 72 hours. No  results for input(s): LIPASE, AMYLASE in the last 72 hours. Cardiac Enzymes No results for input(s): CKTOTAL, CKMB, CKMBINDEX, TROPONINI in the last 72 hours.  BNP: BNP (last 3 results) Recent Labs    03/02/18 0400 03/08/18 0008 03/15/18 0106  BNP 694.1* 1,576.6* 811.3*    ProBNP (last 3 results) No results for input(s): PROBNP in the last 8760 hours.   D-Dimer No results for input(s): DDIMER in the last 72 hours. Hemoglobin A1C No results for input(s): HGBA1C in the last 72 hours. Fasting Lipid Panel No results for input(s): CHOL, HDL, LDLCALC, TRIG, CHOLHDL, LDLDIRECT in the last 72 hours. Thyroid Function Tests No results for input(s): TSH, T4TOTAL, T3FREE,  THYROIDAB in the last 72 hours.  Invalid input(s): FREET3  Other results:   Imaging    Dg Chest Port 1 View  Result Date: 03/14/2018 CLINICAL DATA:  Dyspnea, shortness of breath since this afternoon EXAM: PORTABLE CHEST 1 VIEW COMPARISON:  Portable exam 1711 hours compared to 03/10/2018 FINDINGS: LEFT subclavian AICD leads project over RIGHT atrium and RIGHT ventricle. Epicardial pacing wire present. LVAD at cardiac apex. Enlargement of cardiac silhouette post median sternotomy. Mediastinal contours and pulmonary vascularity normal. Atelectasis in the mid to lower LEFT lung stable. No acute infiltrate, pleural effusion or pneumothorax. IMPRESSION: Enlargement of cardiac silhouette with postoperative changes as above. No acute abnormalities. Electronically Signed   By: Lavonia Dana M.D.   On: 03/14/2018 17:33     Medications:     Scheduled Medications: . aspirin EC  81 mg Oral Daily  . atorvastatin  40 mg Oral q1800  . bisacodyl  10 mg Oral Daily   Or  . bisacodyl  10 mg Rectal Daily  . Chlorhexidine Gluconate Cloth  6 each Topical Q0600  . citalopram  20 mg Oral Daily  . darbepoetin (ARANESP) injection - DIALYSIS  40 mcg Intravenous Q Mon-HD  . docusate sodium  200 mg Oral Daily  . dronabinol  2.5 mg Oral BID AC  . erythromycin  250 mg Oral TID  . feeding supplement (NEPRO CARB STEADY)  237 mL Oral TID BM  . insulin aspart  0-5 Units Subcutaneous QHS  . insulin aspart  0-9 Units Subcutaneous TID WC  . isosorbide-hydrALAZINE  1.5 tablet Oral Q8H  . mouth rinse  15 mL Mouth Rinse BID  . metoCLOPramide (REGLAN) injection  5 mg Intravenous Q6H  . pantoprazole  40 mg Oral BID  . sodium chloride flush  10-40 mL Intracatheter Q12H  . sodium chloride flush  3 mL Intravenous Q12H  . Warfarin - Pharmacist Dosing Inpatient   Does not apply q1800    Infusions: . sodium chloride 250 mL (03/14/18 1747)  . sodium chloride 10 mL/hr at 03/09/18 0800  . [START ON 03/16/2018] vancomycin       PRN Medications: hydrALAZINE, LORazepam, LORazepam, ondansetron (ZOFRAN) IV, oxyCODONE, promethazine, sodium chloride flush, sodium chloride flush, traMADol    Patient Profile   [name] is a 52 year old with a history of CAD and ischemic cardiomyopathy now s/p Heartmate 3 LVAD on 10/18. Post VAD course complicated by ESRD and severe DM gastroparesis. Admitted with low/no-flow alarms due to LVAD outflow graft kink (does not have bend clip).   Assessment/Plan   1. Cardiogenic shock due to LVAD complication with LVAD outflow graft thrombosis: s/p emergent LVAD pump exchange 6/27 for pump thrombosis in setting of LVAD graft kink (did not have bend clip).  Volume looks okay.  - Control volume with HD.  -  Off milrinone, stable. No change.  2. VAD: s/p pump exchange 2/2 HM3 graft kink.  Has HM3.  MAP 80-90 - VAD interrogated personally. Parameters stable.  - Continue coumadin per pharmacy.  Continue ASA 81. INR 2.18 - LDH stable 213 - Continue Bidil 1.5 tabs tid, take on non-HD days.  - Pacing wires still in place. Per pt, plan is to remove today.  3. Intractable Nausea/Vomiting secondary to gastroparesis: Amlodipine stopped. Improving. - Continue reglan, phenergan, and ativan as needed - Continue home Marinol.  - Continue erythromycin 250 mg TID a day for gastroparesis  He has been seen by Dr Derrill Kay on 01/24/18 [facility] gastroparesis specialist) 4. ESRD: CVVHD stopped 03/05/18. Tolerating iHD now, gets MWF.  - HD yesterday.   5. DM,insulin dependent - SSI for now. No change.  6. Acute respiratory failure: Extubated 6/28  Stable on RA  - Up to chair and mobilize. As tolerated. PT/OT now recommending [place] PT. No change.   7. HTN:  - Amlodipine caused gastroparesis symptoms.  - MAPs improved 80-90. Continue bidil 1.5 tab TID.  8. Leukocytosis - WBC 17 > 15. Febrile to 100.7 yesterday, 98.9 this am.  - Blood cultures 7/8 NGTD. Repeat blood cultures 7/10 pending - CT chest/abd 7/8 with no  source of infection. CXR 7/10 negative  - Vancomycin begun.  9. Anemia: No overt bleeding.  - Hemoglobin 7.8 this am. Denies bleeding. Transfuse 1 unit PRBCs. 10. Deconditioning  - PT/OT following. Now recommending [place] [facility]. No change.   I reviewed the LVAD parameters from today, and compared the results to the patient's prior recorded data.  No programming changes were made.  The LVAD is functioning within specified parameters.  The patient performs LVAD self-test daily.  LVAD interrogation was negative for any significant power changes, alarms or PI events/speed drops.  LVAD equipment check completed and is in good working order.  Back-up equipment present.   LVAD education done on emergency procedures and precautions and reviewed exit site care.   Length of Stay: [place], NP  03/15/2018, 7:18 AM  Advanced Heart Failure Team Pager [phone] (M-F; [name])  Please contact [facility] for night-coverage after hours (4p -7a ) and weekends on [email or web address]  Patient seen with NP, agree with the above note.  Fever yesterday with WBCs up to 17.  No definite source for infection other than central line, which we have removed.  He was started on vancomycin.  He was afebrile overnight, WBCs down to 15.  Cultures negative so far.  MAP stable on Bidil.  He is eating and walking around in unit, nausea/vomiting has resolved.    LVAD parameters are stable.   Will watch another day.  If he remains afebrile and WBCs continue to fall, possibly home tomorrow or Saturday. He will get 1 unit PRBCs with slow drop in hemoglobin.   [name] 03/15/2018 12:13 PM

## 2018-03-15 NOTE — Progress Notes (Signed)
[facility] Progress Note  Subjective: 3L off on HD this am, per pt no cramping .  BP's stable  Vitals:   03/15/18 0805 03/15/18 1017 03/15/18 1130 03/15/18 1240  BP: (!) 89/74 102/72 (!) 86/73 99/85  Pulse: 95 (!) 104 96 (!) 104  Resp: 19 (!) 21 15 17   Temp: 99 F (37.2 C) 98.5 F (36.9 C) 98.5 F (36.9 C) 98.6 F (37 C)  TempSrc: Oral Oral Oral Oral  SpO2: 98% 98% 98% 98%  Weight:      Height:        Inpatient medications: . sodium chloride   Intravenous Once  . aspirin EC  81 mg Oral Daily  . atorvastatin  40 mg Oral q1800  . bisacodyl  10 mg Oral Daily   Or  . bisacodyl  10 mg Rectal Daily  . Chlorhexidine Gluconate Cloth  6 each Topical Q0600  . citalopram  20 mg Oral Daily  . darbepoetin (ARANESP) injection - DIALYSIS  40 mcg Intravenous Q Mon-HD  . docusate sodium  200 mg Oral Daily  . dronabinol  2.5 mg Oral BID AC  . erythromycin  250 mg Oral TID  . feeding supplement (NEPRO CARB STEADY)  237 mL Oral TID BM  . insulin aspart  0-5 Units Subcutaneous QHS  . insulin aspart  0-9 Units Subcutaneous TID WC  . isosorbide-hydrALAZINE  1.5 tablet Oral Q8H  . mouth rinse  15 mL Mouth Rinse BID  . metoCLOPramide (REGLAN) injection  5 mg Intravenous Q6H  . pantoprazole  40 mg Oral BID  . sodium chloride flush  10-40 mL Intracatheter Q12H  . sodium chloride flush  3 mL Intravenous Q12H  . warfarin  5 mg Oral ONCE-1800  . Warfarin - Pharmacist Dosing Inpatient   Does not apply q1800   . sodium chloride 250 mL (03/14/18 1747)  . sodium chloride 10 mL/hr at 03/09/18 0800  . [START ON 03/16/2018] vancomycin     hydrALAZINE, LORazepam, LORazepam, ondansetron (ZOFRAN) IV, oxyCODONE, promethazine, sodium chloride flush, sodium chloride flush, traMADol  Exam:  no distress  no jvd  chest cta bilat  cor lvad hum  abd soft ntnd no asictes  ext trace edema  RUA AVG minimal bruit   Home meds:  - norvasc 2.5/ hydralazine 37.31m tid  - ecasa/ lipitor/  sildenafil 20 tid/ coumadin 5 hs  - PPI/ reglan 10 tid/ ativan prn/ marinol prn  - lantus 10 u qd/ novolog 5 u tid  Dialysis: MWF [facility]  4h 151m 3K/2Ca  R AVG  Hep none  dry wt outpt = 80kg (batteries included) // dry wt inpatient = 76.8kg (no battery)  - calc 0.5 ug tiw  - mircera 50 q  2, last 6/26  - venofer 50 weekly        Impression: 1  LVAD thrombosis - sp redo LVAD 02/28/18 2  N/V - norvasc dc'd. Getting on po marinol/ erythromycin/ bid ppi, and IV reglan, improving 3  ESRD: HD MWF. R arm AVG has minimal bruit / thrill due to continuous LVAD bloodflow. HD Friday.  4  Anemia ckd - Hb 7.5- 8.5 range, got darbe 40 ug on 7/8.  Will ^dDarrow Buss[place] 100 ug and check Fe w HD tomorrow.  5  MBD ckd - cont meds 6  Volume - standing wt to 76.8kg (inpatient dry wt w/o batteries) 7  HTN - norvasc caused gastroparesis and has been stopped per cardiology; on bidil alone now  Plan -  as above   Kelly Splinter MD [employer] pager [phone]   03/15/2018, 3:08 PM   Recent Labs  Lab 03/14/18 0449 03/15/18 0103  NA 136 137  K 3.5 3.9  CL 95* 101  CO2 29 28  GLUCOSE 131* 126*  BUN 18 12  CREATININE 6.62* 4.26*  CALCIUM 8.1* 7.6*  INR 2.88 2.18   No results for input(s): AST, ALT, ALKPHOS, BILITOT, PROT in the last 168 hours. Recent Labs  Lab 03/14/18 0449 03/15/18 0103  WBC 17.1* 15.2*  NEUTROABS 14.5* 12.1*  HGB 8.2* 7.8*  HCT 26.0* 25.0*  MCV 93.2 92.3  PLT 406* 365   Iron/TIBC/Ferritin/ %Sat    Component Value Date/Time   IRON 40 (L) 11/02/2017 0016   TIBC 188 (L) 11/02/2017 0016   FERRITIN 523 (H) 11/02/2017 0016   IRONPCTSAT 21 11/02/2017 0016

## 2018-03-15 NOTE — Plan of Care (Signed)
Continue current care plan 

## 2018-03-16 ENCOUNTER — Ambulatory Visit (HOSPITAL_COMMUNITY): Payer: Self-pay

## 2018-03-16 LAB — GLUCOSE, CAPILLARY
GLUCOSE-CAPILLARY: 183 mg/dL — AB (ref 70–99)
Glucose-Capillary: 145 mg/dL — ABNORMAL HIGH (ref 70–99)
Glucose-Capillary: 143 mg/dL — ABNORMAL HIGH (ref 70–99)

## 2018-03-16 LAB — CBC WITH DIFFERENTIAL/PLATELET
Abs Immature Granulocytes: 0.1 10*3/uL (ref 0.0–0.1)
Basophils Absolute: 0.1 10*3/uL (ref 0.0–0.1)
Basophils Relative: 0 %
EOS ABS: 0.4 10*3/uL (ref 0.0–0.7)
EOS PCT: 3 %
HEMATOCRIT: 29.6 % — AB (ref 39.0–52.0)
HEMOGLOBIN: 9.4 g/dL — AB (ref 13.0–17.0)
Immature Granulocytes: 1 %
LYMPHS ABS: 1.4 10*3/uL (ref 0.7–4.0)
Lymphocytes Relative: 9 %
MCH: 28.8 pg (ref 26.0–34.0)
MCHC: 31.8 g/dL (ref 30.0–36.0)
MCV: 90.8 fL (ref 78.0–100.0)
MONOS PCT: 6 %
Monocytes Absolute: 1 10*3/uL (ref 0.1–1.0)
Neutro Abs: 12.4 10*3/uL — ABNORMAL HIGH (ref 1.7–7.7)
Neutrophils Relative %: 81 %
Platelets: 377 10*3/uL (ref 150–400)
RBC: 3.26 MIL/uL — ABNORMAL LOW (ref 4.22–5.81)
RDW: 16.4 % — AB (ref 11.5–15.5)
WBC: 15.3 10*3/uL — ABNORMAL HIGH (ref 4.0–10.5)

## 2018-03-16 LAB — HEPATITIS B SURFACE ANTIGEN: Hepatitis B Surface Ag: NEGATIVE

## 2018-03-16 LAB — BPAM RBC
Blood Product Expiration Date: 201907182359
ISSUE DATE / TIME: 201907111124
Unit Type and Rh: 5100

## 2018-03-16 LAB — TYPE AND SCREEN
ABO/RH(D): O POS
ANTIBODY SCREEN: NEGATIVE
UNIT DIVISION: 0

## 2018-03-16 LAB — PROTIME-INR
INR: 2.05
Prothrombin Time: 22.9 seconds — ABNORMAL HIGH (ref 11.4–15.2)

## 2018-03-16 LAB — BASIC METABOLIC PANEL
Anion gap: 12 (ref 5–15)
BUN: 23 mg/dL — ABNORMAL HIGH (ref 6–20)
CO2: 26 mmol/L (ref 22–32)
CREATININE: 6.78 mg/dL — AB (ref 0.61–1.24)
Calcium: 8.2 mg/dL — ABNORMAL LOW (ref 8.9–10.3)
Chloride: 97 mmol/L — ABNORMAL LOW (ref 98–111)
GFR calc non Af Amer: 8 mL/min — ABNORMAL LOW (ref >60)
GFR, EST AFRICAN AMERICAN: 10 mL/min — AB (ref >60)
Glucose, Bld: 154 mg/dL — ABNORMAL HIGH (ref 70–99)
POTASSIUM: 3.9 mmol/L (ref 3.5–5.1)
Sodium: 135 mmol/L (ref 135–145)

## 2018-03-16 LAB — LACTATE DEHYDROGENASE: LDH: 232 U/L — AB (ref 98–192)

## 2018-03-16 LAB — PROCALCITONIN: Procalcitonin: 0.42 ng/mL

## 2018-03-16 MED ORDER — LORAZEPAM 2 MG/ML IJ SOLN
INTRAMUSCULAR | Status: AC
  Filled 2018-03-16: qty 1

## 2018-03-16 MED ORDER — LIDOCAINE-PRILOCAINE 2.5-2.5 % EX CREA: 1.0000 "application " | TOPICAL_CREAM | CUTANEOUS | Status: DC | PRN

## 2018-03-16 MED ORDER — DARBEPOETIN ALFA 100 MCG/0.5ML IJ SOSY
100.0000 ug | PREFILLED_SYRINGE | INTRAMUSCULAR | Status: DC
  Administered 2018-03-19: 100 ug via INTRAVENOUS
  Filled 2018-03-16: qty 0.5

## 2018-03-16 MED ORDER — PENTAFLUOROPROP-TETRAFLUOROETH EX AERO: 1.0000 "application " | INHALATION_SPRAY | CUTANEOUS | Status: DC | PRN

## 2018-03-16 MED ORDER — WARFARIN SODIUM 7.5 MG PO TABS
7.5000 mg | ORAL_TABLET | Freq: Once | ORAL | Status: AC
  Administered 2018-03-16: 7.5 mg via ORAL
  Filled 2018-03-16 (×2): qty 1

## 2018-03-16 MED ORDER — SODIUM CHLORIDE 0.9 % IV SOLN: 100.0000 mL | INTRAVENOUS | Status: DC | PRN

## 2018-03-16 NOTE — Progress Notes (Signed)
Pt in the procedure room during shift change.

## 2018-03-16 NOTE — Progress Notes (Signed)
PT Cancellation Note  Patient Details Name: [name] MRN: [id number] DOB: 1966/01/10   Cancelled Treatment:    Reason Eval/Treat Not Completed: Patient at procedure or test/unavailable(HD)   Corynne Scibilia B Rashmi Tallent 03/16/2018, 7:42 AM [name], [place]

## 2018-03-16 NOTE — Progress Notes (Signed)
16 Days Post-Op Procedure(s) (LRB): TRANSESOPHAGEAL ECHOCARDIOGRAM (TEE) (N/A) REDO INSERTION OF IMPLANTABLE LEFT VENTRICULAR ASSIST DEVICE - HEARTMATE 3 (N/A) Subjective: Patient is somewhat fatigued after hemodialysis today No more low-grade fevers since 11 PM last night Patient denies specific site of pain.  Appetite somewhat off today Procalcitonin levels are negative for sepsis, white count slightly improved today Patient has a small right neck hematoma where the surgical sheath was placed which may be causing low-grade fever. He is receiving IV vancomycin dose per pharmacy.  Objective: Vital signs in last 24 hours: Temp:  [98 F (36.7 C)-100.4 F (38 C)] 98 F (36.7 C) (07/12 1517) Pulse Rate:  [76-106] 96 (07/12 1517) Cardiac Rhythm: Normal sinus rhythm (07/12 1607) Resp:  [15-27] 20 (07/12 1517) BP: (88-141)/(73-106) 110/94 (07/12 1517) SpO2:  [94 %-99 %] 98 % (07/12 1517) Weight:  [171 lb 4.8 oz (77.7 kg)-179 lb 14.3 oz (81.6 kg)] 175 lb 0.7 oz (79.4 kg) (07/12 1138)  Hemodynamic parameters for last 24 hours:    Intake/Output from previous day: 07/11 0701 - 07/12 0700 In: 1606 [P.O.:1041; I.V.:250; Blood:315] Out: -  Intake/Output this shift: Total I/O In: 0  Out: 2000 [Other:2000]  Lungs clear Normal VAD hum Abdomen soft Sternal, abdominal incisions healing Old driveline tunnel being packed daily Lab Results: Recent Labs    03/15/18 1629 03/16/18 0629  WBC 18.1* 15.3*  HGB 9.8* 9.4*  HCT 31.6* 29.6*  PLT 404* 377   BMET:  Recent Labs    03/15/18 0103 03/16/18 0629  NA 137 135  K 3.9 3.9  CL 101 97*  CO2 28 26  GLUCOSE 126* 154*  BUN 12 23*  CREATININE 4.26* 6.78*  CALCIUM 7.6* 8.2*    PT/INR:  Recent Labs    03/16/18 0629  LABPROT 22.9*  INR 2.05   ABG    Component Value Date/Time   PHART 7.326 (L) 03/02/2018 1635   HCO3 23.6 03/02/2018 1635   TCO2 25 03/02/2018 1635   ACIDBASEDEF 3.0 (H) 03/02/2018 1635   O2SAT 62.1 03/09/2018  0340   CBG (last 3)  Recent Labs    03/15/18 2132 03/16/18 1226 03/16/18 1641  GLUCAP 112* 183* 145*    Assessment/Plan: S/P Procedure(s) (LRB): TRANSESOPHAGEAL ECHOCARDIOGRAM (TEE) (N/A) REDO INSERTION OF IMPLANTABLE LEFT VENTRICULAR ASSIST DEVICE - HEARTMATE 3 (N/A) Continue hospital care for wound packing, IV antibiotics and observation.  Hopefully will be ready for discharge home Monday.   LOS: 16 days    [name] 03/16/2018

## 2018-03-16 NOTE — Progress Notes (Addendum)
[facility] Progress Note   Subjective:   Seen on HD, tolerating well. Eating breakfast.  No new complaints.  N/v improved.   Objective Vitals:   03/16/18 0830 03/16/18 0900 03/16/18 0930 03/16/18 0945  BP: 122/88 (!) 115/99 (!) 121/102 (!) 116/91  Pulse: 82 83 95 100  Resp: 16 18 17 20   Temp:      TempSrc:      SpO2:      Weight:      Height:       Physical Exam General:NAD, chronically ill appearing male Heart:+LVAD hum w/mechanical heart sounds Lungs:CTAB Abdomen:soft, NTND Extremities:no LE edema Dialysis Access: LU AVG cannulated   Filed Weights   03/15/18 0629 03/16/18 0500 03/16/18 0715  Weight: 78.7 kg (173 lb 8 oz) 77.7 kg (171 lb 4.8 oz) 81.6 kg (179 lb 14.3 oz)    Intake/Output Summary (Last 24 hours) at 03/16/2018 1007 Last data filed at 03/15/2018 1515 Gross per 24 hour  Intake 1264 ml  Output -  Net 1264 ml    Additional Objective Labs: Basic Metabolic Panel: Recent Labs  Lab 03/14/18 0449 03/15/18 0103 03/16/18 0629  NA 136 137 135  K 3.5 3.9 3.9  CL 95* 101 97*  CO2 29 28 26   GLUCOSE 131* 126* 154*  BUN 18 12 23*  CREATININE 6.62* 4.26* 6.78*  CALCIUM 8.1* 7.6* 8.2*   Liver Function Tests: No results for input(s): AST, ALT, ALKPHOS, BILITOT, PROT, ALBUMIN in the last 168 hours. No results for input(s): LIPASE, AMYLASE in the last 168 hours. CBC: Recent Labs  Lab 03/13/18 0430 03/14/18 0449 03/15/18 0103 03/15/18 1629 03/16/18 0629  WBC 13.1* 17.1* 15.2* 18.1* 15.3*  NEUTROABS 9.9* 14.5* 12.1*  --  12.4*  HGB 8.7* 8.2* 7.8* 9.8* 9.4*  HCT 27.6* 26.0* 25.0* 31.6* 29.6*  MCV 92.9 93.2 92.3 92.7 90.8  PLT 417* 406* 365 404* 377   Blood Culture    Component Value Date/Time   SDES BLOOD LEFT HAND 03/14/2018 1732   SPECREQUEST  03/14/2018 1732    BOTTLES DRAWN AEROBIC ONLY Blood Culture results may not be optimal due to an inadequate volume of blood received in culture bottles   CULT  03/14/2018 1732    NO GROWTH  < 24 HOURS Performed at [facility], [place] [address] [place], [place] [address]    REPTSTATUS PENDING 03/14/2018 1732    Cardiac Enzymes: No results for input(s): CKTOTAL, CKMB, CKMBINDEX, TROPONINI in the last 168 hours. CBG: Recent Labs  Lab 03/14/18 2138 03/15/18 0804 03/15/18 1239 03/15/18 1659 03/15/18 2132  GLUCAP 121* 137* 183* 159* 112*   Iron Studies: No results for input(s): IRON, TIBC, TRANSFERRIN, FERRITIN in the last 72 hours. Lab Results  Component Value Date   INR 2.05 03/16/2018   INR 2.18 03/15/2018   INR 2.88 03/14/2018   Studies/Results: Dg Chest Port 1 View  Result Date: 03/14/2018 CLINICAL DATA:  Dyspnea, shortness of breath since this afternoon EXAM: PORTABLE CHEST 1 VIEW COMPARISON:  Portable exam 1711 hours compared to 03/10/2018 FINDINGS: LEFT subclavian AICD leads project over RIGHT atrium and RIGHT ventricle. Epicardial pacing wire present. LVAD at cardiac apex. Enlargement of cardiac silhouette post median sternotomy. Mediastinal contours and pulmonary vascularity normal. Atelectasis in the mid to lower LEFT lung stable. No acute infiltrate, pleural effusion or pneumothorax. IMPRESSION: Enlargement of cardiac silhouette with postoperative changes as above. No acute abnormalities. Electronically Signed   By: Lavonia Dana M.D.   On: 03/14/2018  17:33    Medications: . sodium chloride 250 mL (03/14/18 1747)  . sodium chloride Stopped (03/15/18 1140)  . sodium chloride    . vancomycin     . aspirin EC  81 mg Oral Daily  . atorvastatin  40 mg Oral q1800  . bisacodyl  10 mg Oral Daily   Or  . bisacodyl  10 mg Rectal Daily  . Chlorhexidine Gluconate Cloth  6 each Topical Q0600  . citalopram  20 mg Oral Daily  . darbepoetin (ARANESP) injection - DIALYSIS  40 mcg Intravenous Q Mon-HD  . docusate sodium  200 mg Oral Daily  . dronabinol  2.5 mg Oral BID AC  . erythromycin  250 mg Oral TID  . feeding supplement (NEPRO CARB STEADY)  237 mL Oral TID  BM  . insulin aspart  0-5 Units Subcutaneous QHS  . insulin aspart  0-9 Units Subcutaneous TID WC  . isosorbide-hydrALAZINE  1.5 tablet Oral Q8H  . mouth rinse  15 mL Mouth Rinse BID  . metoCLOPramide (REGLAN) injection  5 mg Intravenous Q6H  . pantoprazole  40 mg Oral BID  . sodium chloride flush  10-40 mL Intracatheter Q12H  . sodium chloride flush  3 mL Intravenous Q12H  . Warfarin - Pharmacist Dosing Inpatient   Does not apply q1800    Dialysis Orders: MWF [facility] 4h 46mn 3K/2Ca R AVG  Hep none  dry wt 80kg (with batteries) or 76.8kg (w/o batteries) - calc 0.5 ug tiw - mircera 50 q 2, last 6/26 - venofer 50 weekly  Home meds: - norvasc 2.5/ hydralazine 37.566mtid - ecasa/ lipitor/ sildenafil 20 tid/ coumadin 5 hs - PPI/ reglan 10 tid/ ativan prn/ marinol prn - lantus 10 u qd/ novolog 5 u tid  Assessment/Plan: 1. LVAD thrombosis - s/p LVAD exchange 6/27.  Per HF team 2. Intractable n/v 2/2 gastroparesis - improved off amlodipine. On marinol, erythromycin, PPI, and IV reglan. 3. ESRD - MWF HD via RU AVG. CVVHD stopped 03/05/18.  UFG 2.7L. Continue per regular schedule.   4. Anemia of CKD- Hgb ^9.4 today.  Aranesp 4066mgiven 7/8, ^100m27mTransfused 1Unit pRBC yesterday, 5Unit total this admission. Checking iron studies.  5. Secondary hyperparathyroidism - Ca 8.2, need to check Phos. continue VDRA. Not on binders.  6. HTN/volume - BP variable. Amlodipine stopped d/t gastroparesis per cardio. On Bidil.  Does not appear volume overloaded at this time. Titrate down as tolerated.  7. Nutrition - renal/carb modified diet.  8. DM - on insulin 9. Deconditioning - PT/OT following. [facility] Pager: [phone]/2019,10:07 AM  LOS: 16 days   Pt seen, examined and agree w A/P as above.  Rob Kelly SplinterCaroNewell Rubbermaider [phone] 1111/08/2018, 11:51 AM

## 2018-03-16 NOTE — Progress Notes (Signed)
ANTICOAGULATION CONSULT NOTE - Follow Up  Consult  Pharmacy Consult for warfarin, heparin Indication: LVAD  Allergies  Allergen Reactions  . Metformin And Related Diarrhea    Patient Measurements: Height: 5' 8"  (172.7 cm) Weight: 179 lb 14.3 oz (81.6 kg)(with battery) IBW/kg (Calculated) : 68.4  Vital Signs: Temp: 98.4 F (36.9 C) (07/12 0715) Temp Source: Oral (07/12 0715) BP: 117/92 (07/12 1045) Pulse Rate: 99 (07/12 1045)  Labs: Recent Labs    03/14/18 0449 03/15/18 0103 03/15/18 1629 03/16/18 0629  HGB 8.2* 7.8* 9.8* 9.4*  HCT 26.0* 25.0* 31.6* 29.6*  PLT 406* 365 404* 377  LABPROT 30.0* 24.1*  --  22.9*  INR 2.88 2.18  --  2.05  CREATININE 6.62* 4.26*  --  6.78*    Estimated Creatinine Clearance: 12.2 mL/min (A) (by C-G formula based on SCr of 6.78 mg/dL (H)).   Medical History: Past Medical History:  Diagnosis Date  . Angina   . ASCVD (arteriosclerotic cardiovascular disease)    , Anterior infarction 2005, LAD diagonal bifurcation intervention 03/2004  . Automatic implantable cardiac defibrillator -[employer] Jude's       . Benign neoplasm of colon   . CHF (congestive heart failure) ([place])   . Chronic systolic heart failure ([place])   . Coronary artery disease     Widely patent previously placed stents in the left anterior   . Crohn's disease ([place])   . 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 Dialysis patient ([place])   . Dyspnea   . Gastroparesis   . GERD (gastroesophageal reflux disease)   . High cholesterol   . Hyperlipidemia   . Hypersomnolent    Previous diagnosis of narcolepsy  . Hypertension, essential   . Ischemic cardiomyopathy    Ejection fraction 15-20% catheterization 2010  . Type II or unspecified type diabetes mellitus without mention of complication, not stated as uncontrolled   . Unspecified gastritis and gastroduodenitis without mention of hemorrhage     Assessment: 59 yom on warfarin PTA for LVAD/factor V leiden who is  now s/p LVAD change out for pump thrombus. Previous warfarin regimen for goal 2-3 is 5 mg daily except 2.5 mg on Friday.    Restarted on erythromycin, which can impact INR sensitivity, for gastroparesis.    INR peaked at 2.9 now down to 2.0 this morning. Hgb up to 9.4 after 1 unit yesterday, LDH stable in low 200s. No bleeding issues noted.  Given INR trend will give higher dose today.      Goal of Therapy:  INR 2-3 Monitor platelets by anticoagulation protocol: Yes   Plan:  Will order warfarin 7.5 mg tonight Daily INR, CBC, and for s/sx of bleeding.  Erin Hearing PharmD., BCPS Clinical Pharmacist 03/16/2018 11:03 AM

## 2018-03-16 NOTE — Progress Notes (Signed)
LVAD Coordinator Rounding Note:  Admitted 02/28/18 from the ED for 0 Flow from ED. Pt was taken emergently to the OR for outflow graft twist/obstruction.  HM III LVAD implanted on 06/12/17 by Dr. Prescott Gum under Destination Therapy criteria due to renal insufficiency. Pump exchange on 03/01/18 for outflow graft twist/obstruction.   Pt was seen in dailysis unit today. Pt has a VAD trained dialysis nurse. Pt states he feels much better today.   Vital signs: Temp: 98.4 HR:  90 Doppler Pressure:  95 Auto BP:  140/104 O2 Sat: 99% on RA Wt: 176>166>193>180>185>184>190>189>187>188>176>174>170>173>175 lbs  LVAD interrogation reveals:  Speed: 5500 Flow: 4.2 Power: 4.7w PI: 3.3 Alarms: none  Events:  none Hematocrit: 30 Fixed speed: 5500 Low speed limit: 5200  Drive Line:   Gauze dressing dry and intact; anchor intact and accurately applied. Existing VAD dressing removed and site care performed using sterile technique. Drive line exit site cleaned with Chlora prep applicators x 2, allowed to dry, and gauzedressing with silver stripre-applied. Exit site healingand unincorporated, the velour is fully implanted at exit site. Stitch intact. Moderate amount of bloody drainage with no redness, tenderness, foul odor or rash noted.Drive line anchorsintactx 2.      Removed iodoform from oldleft abdominaldriveline site and repacked with 1" iodoform using sterile technique. Covered with a 4 x 4 and tape.  Labs:  LDH trend: 384>253>241>304>217>207>197>206>201>213>212>213>232  INR trend: 1.70>3.07>4.13>2.37>1.95>1.68>1.54>1.48>2.83>2.98>2.88>2.18>2.05  Blood Products: Intra-op: 03/01/18 - 5 PRBCs     2 PLT     2 FFP     2 Cryo  Gtts: Milrinone 0.125 mcg/kg/min (stopped 03/07/18) Levo 12 mcg/min (stopped 03/04/18) Neo 25 mcg/min (stopped 03/03/18) Epi 1 mcg/min (stopped 03/03/18)  Anticoagulation Plan: -INR Goal: 2.0 - 3.0 -ASA Dose: 81 mg daily   Device: -St Jude dual    -Therapies: currently off - Monitor: on VT 150 bpm  Adverse Events on VAD: - ESRD post LVAD; started CVVH 06/15/17; HD started 06/20/17 - Hospitalization 11/5 - 07/16/17 > gastroparesis diagnosis after EGD - Hospitalization 11/26 - 08/04/17 > gastroparesis - Hospitalization 1/8 - 09/16/17>gastroparesis - referred to Dr. Corliss Parish at [facility] - 10/31/17>rightupper arm arteriovenous graft with Artegraft; ligation of right first stage brachial vein transposition  - 12/11/13 elective admission for thrombectomy for AV graft in RUA  - 12/20/17 > intractable N/V - 01/19/18 > intractable N/V sent by EMS from dialysis center - 03/01/18 > outflow graft twist/occulsion requiring pump exchange   Plan/Recommendations:  1. Dressing change with old DL site packed daily by VAD Coordinator, Nurse Davonna Belling, or trained caregiver.  2. Please page VAD coordinator with any equipment questions or concerns.  [name] RN, VAD Coordinator 24/7 VAD Pager: [phone]

## 2018-03-16 NOTE — Progress Notes (Signed)
CARDIAC REHAB PHASE I   PRE:  Rate/Rhythm: 94 SR PVCs  BP:  Supine:   Sitting: 112/94 (102)  dopplered 98 MAP  Standing:    SaO2:   MODE:  Ambulation: 120 ft   POST:  Rate/Rhythm: 103 ST PVCs  BP:  Supine:   Sitting: 127/92 (98)  dopplered 100 MAP  Standing:    SaO2: 98%RA 1355-1455 Pt walked 120 ft on RA with rollator and asst x 2 since pt had dialysis and feeling tired. To bathroom after walk and assisted to chair. MAPs high this pm and RN aware. Pt just stated he was tired today. Awaiting lunch. Pt in agreement to refer to [place] CRP 2.   Graylon Good, RN BSN  03/16/2018 2:48 PM

## 2018-03-16 NOTE — Progress Notes (Signed)
OT Cancellation Note  Patient Details Name: WEBB WEED MRN: [id number] DOB: [birth year]   Cancelled Treatment:    Reason Eval/Treat Not Completed: Patient at procedure or test/ unavailable   Lucille Passy, OTR/L [phone]   Lucille Passy M 03/16/2018, 10:36 AM

## 2018-03-16 NOTE — Progress Notes (Addendum)
Patient ID: [name], male   DOB: 28-Nov-1965, 52 y.o.   MRN: [id number]     Advanced Heart Failure Rounding Note  PCP-Cardiologist: No primary care provider on file.   Subjective:    CVVHD stopped 03/05/18.   Tolerating iHD well (MWF). ICD turned back on 7/8  RAMP echo 7/9 speed increased to 5500  WBC 15.3 this am. Tmax 100.4 overnight. Remains on IV vanc. Blood cultures NGTD. MAPs stable 80-100.  LDH stable 232, INR 2.05, Hemoglobin 8.7 -> 8.2 -> 7.8 -> 1u PRBC -> 9.4. Denies bleeding.   Denies SOB, fever, chills, or bleeding. Tolerating diet with no problems. Pain well controlled. Currently getting dialysis.   CT chest/abd 03/13/18: 1. No specific explanation for fever. 2. Cluster of gas bubbles in the left anterior pleura or mediastinum, expected after recent surgery. 3. Atelectasis and trace effusions. 4. Good opacification of outflow cannula after interval revision. 5. Packed wound in the left abdominal wall without superimposed fluid collection or cellulitis.  LVAD Interrogation HM 3: Speed: 5500 Flow: 4.0 PI: 4.2 Power: 4. No monitor to assess PI events. VAD interrogated personally. Parameters stable.  Objective:   Weight Range: 171 lb 4.8 oz (77.7 kg) Body mass index is 26.05 kg/m.   Vital Signs:   Temp:  [98.5 F (36.9 C)-100.4 F (38 C)] 99.1 F (37.3 C) (07/12 0407) Pulse Rate:  [92-106] 92 (07/12 0407) Resp:  [15-21] 20 (07/12 0407) BP: (86-112)/(70-85) 102/77 (07/11 2327) SpO2:  [94 %-99 %] 94 % (07/12 0407) Weight:  [171 lb 4.8 oz (77.7 kg)] 171 lb 4.8 oz (77.7 kg) (07/12 0500) Last BM Date: 03/15/18  MAPs 80-100   Weight change: Filed Weights   03/14/18 1230 03/15/18 0629 03/16/18 0500  Weight: 170 lb 10.2 oz (77.4 kg) 173 lb 8 oz (78.7 kg) 171 lb 4.8 oz (77.7 kg)   Intake/Output:   Intake/Output Summary (Last 24 hours) at 03/16/2018 0728 Last data filed at 03/15/2018 1515 Gross per 24 hour  Intake 1606 ml  Output -  Net 1606 ml     Physical Exam   General:  NAD.  HEENT: Normal. Neck: Supple, JVP 7-8 cm. Carotids OK.  Cardiac:  Mechanical heart sounds with LVAD hum present.  Lungs:  CTAB, normal effort.  Abdomen:  NT, ND, no HSM. No bruits or masses. +BS  LVAD exit site: Dressing dry and intact. No erythema or drainage. Stabilization device present and accurately applied.  Extremities:  Warm and dry. No cyanosis, clubbing, rash, or edema.  Neuro:  Alert & oriented x 3. Cranial nerves grossly intact. Moves all 4 extremities w/o difficulty. Affect pleasant     Telemetry   NSR 90s.  Personally reviewed.   Labs    CBC Recent Labs    03/15/18 0103 03/15/18 1629 03/16/18 0629  WBC 15.2* 18.1* 15.3*  NEUTROABS 12.1*  --  12.4*  HGB 7.8* 9.8* 9.4*  HCT 25.0* 31.6* 29.6*  MCV 92.3 92.7 90.8  PLT 365 404* 627   Basic Metabolic Panel Recent Labs    03/14/18 0449 03/15/18 0103  NA 136 137  K 3.5 3.9  CL 95* 101  CO2 29 28  GLUCOSE 131* 126*  BUN 18 12  CREATININE 6.62* 4.26*  CALCIUM 8.1* 7.6*   Liver Function Tests No results for input(s): AST, ALT, ALKPHOS, BILITOT, PROT, ALBUMIN in the last 72 hours. No results for input(s): LIPASE, AMYLASE in the last 72 hours. Cardiac Enzymes No results for input(s): CKTOTAL, CKMB, CKMBINDEX,  TROPONINI in the last 72 hours.  BNP: BNP (last 3 results) Recent Labs    03/02/18 0400 03/08/18 0008 03/15/18 0106  BNP 694.1* 1,576.6* 811.3*    ProBNP (last 3 results) No results for input(s): PROBNP in the last 8760 hours.   D-Dimer No results for input(s): DDIMER in the last 72 hours. Hemoglobin A1C No results for input(s): HGBA1C in the last 72 hours. Fasting Lipid Panel No results for input(s): CHOL, HDL, LDLCALC, TRIG, CHOLHDL, LDLDIRECT in the last 72 hours. Thyroid Function Tests No results for input(s): TSH, T4TOTAL, T3FREE, THYROIDAB in the last 72 hours.  Invalid input(s): FREET3  Other results:   Imaging    No results  found.   Medications:     Scheduled Medications: . aspirin EC  81 mg Oral Daily  . atorvastatin  40 mg Oral q1800  . bisacodyl  10 mg Oral Daily   Or  . bisacodyl  10 mg Rectal Daily  . Chlorhexidine Gluconate Cloth  6 each Topical Q0600  . citalopram  20 mg Oral Daily  . darbepoetin (ARANESP) injection - DIALYSIS  40 mcg Intravenous Q Mon-HD  . docusate sodium  200 mg Oral Daily  . dronabinol  2.5 mg Oral BID AC  . erythromycin  250 mg Oral TID  . feeding supplement (NEPRO CARB STEADY)  237 mL Oral TID BM  . insulin aspart  0-5 Units Subcutaneous QHS  . insulin aspart  0-9 Units Subcutaneous TID WC  . isosorbide-hydrALAZINE  1.5 tablet Oral Q8H  . mouth rinse  15 mL Mouth Rinse BID  . metoCLOPramide (REGLAN) injection  5 mg Intravenous Q6H  . pantoprazole  40 mg Oral BID  . sodium chloride flush  10-40 mL Intracatheter Q12H  . sodium chloride flush  3 mL Intravenous Q12H  . Warfarin - Pharmacist Dosing Inpatient   Does not apply q1800    Infusions: . sodium chloride 250 mL (03/14/18 1747)  . sodium chloride Stopped (03/15/18 1140)  . vancomycin      PRN Medications: hydrALAZINE, LORazepam, LORazepam, ondansetron (ZOFRAN) IV, oxyCODONE, promethazine, sodium chloride flush, sodium chloride flush, traMADol    Patient Profile   [name] is a 52 year old with a history of CAD and ischemic cardiomyopathy now s/p Heartmate 3 LVAD on 10/18. Post VAD course complicated by ESRD and severe DM gastroparesis. Admitted with low/no-flow alarms due to LVAD outflow graft kink (does not have bend clip).   Assessment/Plan   1. Cardiogenic shock due to LVAD complication with LVAD outflow graft thrombosis: s/p emergent LVAD pump exchange 6/27 for pump thrombosis in setting of LVAD graft kink (did not have bend clip).  Volume looks stable.  - Control volume with HD.  - Off milrinone, stable. No change.  2. VAD: s/p pump exchange 2/2 HM3 graft kink.  Has HM3.  MAP 80-90 - VAD  interrogated personally. Parameters stable.  - Continue coumadin per pharmacy.  Continue ASA 81. INR 2.05 - LDH stable 232 - Continue Bidil 1.5 tabs tid, take on non-HD days.  - Pacing wires removed yesterday.  3. Intractable Nausea/Vomiting secondary to gastroparesis: Amlodipine stopped. Resolved.  - Continue reglan, phenergan, and ativan as needed - Continue home Marinol.  - Continue erythromycin 250 mg TID a day for gastroparesis  He has been seen by Dr Derrill Kay on 01/24/18 [facility] gastroparesis specialist) 4. ESRD: CVVHD stopped 03/05/18. Tolerating iHD now, gets MWF.  - HD today  5. DM,insulin dependent - SSI for now. No change.  6. Acute respiratory failure: Extubated 6/28  Stable on RA - Up to chair and mobilize. As tolerated. PT/OT now recommending [place] PT. No change.  7. HTN:  - Amlodipine caused gastroparesis symptoms.  - MAPs improved 80-100. Continue bidil 1.5 tab TID. Prescription called into [facility] yesterday. I will get him samples in case he discharges over the weekend.  8. Leukocytosis - WBC 17 > 15 > 18 > 15 this am. Febrile to 100.4 overnight.  - Blood cultures 7/8 NGTD. Repeat blood cultures 7/10 NGTD - CT chest/abd 7/8 with no source of infection. CXR 7/10 negative  - Remains on vanc 9. Anemia: No overt bleeding.  - Hemoglobin 7.8 > 1 unit PRBC > 9.8 > 9.4 10. Deconditioning  - PT/OT following. Now recommending [place] PT. No change.  Per Dr Darcey Nora, will not discharge today with ongoing fever and leukocytosis. Monitor at least one more night.   I reviewed the LVAD parameters from today, and compared the results to the patient's prior recorded data.  No programming changes were made.  The LVAD is functioning within specified parameters.  The patient performs LVAD self-test daily.  LVAD interrogation was negative for any significant power changes, alarms or PI events/speed drops.  LVAD equipment check completed and is in good working order.  Back-up equipment present.   LVAD  education done on emergency procedures and precautions and reviewed exit site care.   Length of Stay: [address] NP  03/16/2018, 7:28 AM  Advanced Heart Failure Team Pager [phone] (M-F; 7a - 4p)  Please contact [facility] for night-coverage after hours (4p -7a ) and weekends on [email or web address]  Patient seen with NP, agree with the above note.    Temperature to 100.4 last night, now afebrile.  WBCs down to 15.  No definite source for infection other than central line, which we have removed.  He was started on vancomycin and is being dosed with HD.  Cultures negative so far.  MAP stable on Bidil.  He is eating and walking around in unit, nausea/vomiting has resolved.    LVAD parameters are stable.   Appropriate bump in hgb with 1 unit PRBCs.   Will watch another day.  If he remains afebrile and WBCs continue to fall, possibly home Saturday or Sunday.   Abhinav Mayorquin 03/16/2018 2:27 PM

## 2018-03-16 NOTE — Addendum Note (Signed)
Addendum  created 03/16/18 1831 by Lillia Abed, MD   Diagnosis association updated

## 2018-03-17 LAB — BASIC METABOLIC PANEL
ANION GAP: 9 (ref 5–15)
BUN: 13 mg/dL (ref 6–20)
CALCIUM: 8 mg/dL — AB (ref 8.9–10.3)
CO2: 30 mmol/L (ref 22–32)
Chloride: 97 mmol/L — ABNORMAL LOW (ref 98–111)
Creatinine, Ser: 4.37 mg/dL — ABNORMAL HIGH (ref 0.61–1.24)
GFR calc non Af Amer: 14 mL/min — ABNORMAL LOW (ref >60)
GFR, EST AFRICAN AMERICAN: 16 mL/min — AB (ref >60)
Glucose, Bld: 147 mg/dL — ABNORMAL HIGH (ref 70–99)
POTASSIUM: 3.9 mmol/L (ref 3.5–5.1)
Sodium: 136 mmol/L (ref 135–145)

## 2018-03-17 LAB — GLUCOSE, CAPILLARY
GLUCOSE-CAPILLARY: 208 mg/dL — AB (ref 70–99)
GLUCOSE-CAPILLARY: 209 mg/dL — AB (ref 70–99)
GLUCOSE-CAPILLARY: 190 mg/dL — AB (ref 70–99)
Glucose-Capillary: 138 mg/dL — ABNORMAL HIGH (ref 70–99)

## 2018-03-17 LAB — CULTURE, BLOOD (ROUTINE X 2)
Culture: NO GROWTH
Culture: NO GROWTH
Special Requests: ADEQUATE
Special Requests: ADEQUATE

## 2018-03-17 LAB — CBC WITH DIFFERENTIAL/PLATELET
ABS IMMATURE GRANULOCYTES: 0.1 10*3/uL (ref 0.0–0.1)
BASOS PCT: 0 %
Basophils Absolute: 0 10*3/uL (ref 0.0–0.1)
EOS ABS: 0.4 10*3/uL (ref 0.0–0.7)
EOS PCT: 4 %
HEMATOCRIT: 28.7 % — AB (ref 39.0–52.0)
HEMOGLOBIN: 9.1 g/dL — AB (ref 13.0–17.0)
IMMATURE GRANULOCYTES: 1 %
Lymphocytes Relative: 10 %
Lymphs Abs: 1.2 10*3/uL (ref 0.7–4.0)
MCH: 28.8 pg (ref 26.0–34.0)
MCHC: 31.7 g/dL (ref 30.0–36.0)
MCV: 90.8 fL (ref 78.0–100.0)
MONO ABS: 1.1 10*3/uL — AB (ref 0.1–1.0)
Monocytes Relative: 9 %
NEUTROS ABS: 9.2 10*3/uL — AB (ref 1.7–7.7)
Neutrophils Relative %: 76 %
PLATELETS: 352 10*3/uL (ref 150–400)
RBC: 3.16 MIL/uL — ABNORMAL LOW (ref 4.22–5.81)
RDW: 16 % — ABNORMAL HIGH (ref 11.5–15.5)
WBC: 12.1 10*3/uL — ABNORMAL HIGH (ref 4.0–10.5)

## 2018-03-17 LAB — PROCALCITONIN: Procalcitonin: 0.36 ng/mL

## 2018-03-17 LAB — LACTATE DEHYDROGENASE: LDH: 204 U/L — AB (ref 98–192)

## 2018-03-17 LAB — PROTIME-INR
INR: 2.13
PROTHROMBIN TIME: 23.6 s — AB (ref 11.4–15.2)

## 2018-03-17 MED ORDER — WARFARIN SODIUM 4 MG PO TABS
4.0000 mg | ORAL_TABLET | Freq: Once | ORAL | Status: AC
  Administered 2018-03-17: 4 mg via ORAL
  Filled 2018-03-17: qty 1

## 2018-03-17 NOTE — Progress Notes (Signed)
ANTICOAGULATION CONSULT NOTE - Follow Up  Consult  Pharmacy Consult for warfarin, heparin Indication: LVAD  Allergies  Allergen Reactions  . Metformin And Related Diarrhea    Patient Measurements: Height: 5' 8"  (172.7 cm) Weight: 169 lb 8.5 oz (76.9 kg) IBW/kg (Calculated) : 68.4  Vital Signs: Temp: 98.6 F (37 C) (07/13 0740) Temp Source: Oral (07/13 0740) BP: 101/81 (07/13 0740) Pulse Rate: 94 (07/13 0740)  Labs: Recent Labs    03/15/18 0103 03/15/18 1629 03/16/18 0629 03/17/18 0308  HGB 7.8* 9.8* 9.4* 9.1*  HCT 25.0* 31.6* 29.6* 28.7*  PLT 365 404* 377 352  LABPROT 24.1*  --  22.9* 23.6*  INR 2.18  --  2.05 2.13  CREATININE 4.26*  --  6.78* 4.37*    Estimated Creatinine Clearance: 18.9 mL/min (A) (by C-G formula based on SCr of 4.37 mg/dL (H)).   Medical History: Past Medical History:  Diagnosis Date  . Angina   . ASCVD (arteriosclerotic cardiovascular disease)    , Anterior infarction 2005, LAD diagonal bifurcation intervention 03/2004  . Automatic implantable cardiac defibrillator -St. Jude's       . Benign neoplasm of colon   . CHF (congestive heart failure) ([place])   . Chronic systolic heart failure ([place])   . Coronary artery disease     Widely patent previously placed stents in the left anterior   . Crohn's disease ([place])   . 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 Dialysis patient ([place])   . Dyspnea   . Gastroparesis   . GERD (gastroesophageal reflux disease)   . High cholesterol   . Hyperlipidemia   . Hypersomnolent    Previous diagnosis of narcolepsy  . Hypertension, essential   . Ischemic cardiomyopathy    Ejection fraction 15-20% catheterization 2010  . Type II or unspecified type diabetes mellitus without mention of complication, not stated as uncontrolled   . Unspecified gastritis and gastroduodenitis without mention of hemorrhage     Assessment: 30 yom on warfarin PTA for LVAD/factor V leiden who is now s/p LVAD  change out for pump thrombus. Previous warfarin regimen for goal 2-3 is 5 mg daily except 2.5 mg on Friday.    Restarted on erythromycin, which can impact INR sensitivity, for gastroparesis.    INR peaked at 2.9, down to 2.13. Hgb up to 9.1 after 1 unit prbcs, LDH stable in low 200s. No bleeding issues noted.  Given INR trend will give higher dose today.      Goal of Therapy:  INR 2-3 Monitor platelets by anticoagulation protocol: Yes   Plan:  Will order warfarin 4 mg tonight Daily INR, CBC, and for s/sx of bleeding.  Marguerite Olea, [facility] Clinical Pharmacist Phone [phone]  03/17/2018 10:52 AM

## 2018-03-17 NOTE — Progress Notes (Addendum)
[facility] Progress Note   Subjective:   Sitting in bedside chair by window.  States he is feeling well.  Denies n/v/d.   Objective Vitals:   03/17/18 0333 03/17/18 0700 03/17/18 0740 03/17/18 1130  BP: 98/84 101/81 101/81 98/80  Pulse: 96  94 89  Resp: 20   10  Temp: 99.2 F (37.3 C)  98.6 F (37 C) 98 F (36.7 C)  TempSrc: Oral  Oral Oral  SpO2: 99%  98% 100%  Weight: 76.9 kg (169 lb 8.5 oz)     Height:       Physical Exam General:NAD, chronically ill appearing male Heart:mechanical heart sounds, +LVAD hum Lungs:CTAB Abdomen:soft, NTND Extremities:no LE edema Dialysis Access: LU AVG    Filed Weights   03/16/18 0715 03/16/18 1138 03/17/18 0333  Weight: 81.6 kg (179 lb 14.3 oz) 79.4 kg (175 lb 0.7 oz) 76.9 kg (169 lb 8.5 oz)    Intake/Output Summary (Last 24 hours) at 03/17/2018 1136 Last data filed at 03/17/2018 1100 Gross per 24 hour  Intake 2166.34 ml  Output 2000 ml  Net 166.34 ml    Additional Objective Labs: Basic Metabolic Panel: Recent Labs  Lab 03/15/18 0103 03/16/18 0629 03/17/18 0308  NA 137 135 136  K 3.9 3.9 3.9  CL 101 97* 97*  CO2 28 26 30   GLUCOSE 126* 154* 147*  BUN 12 23* 13  CREATININE 4.26* 6.78* 4.37*  CALCIUM 7.6* 8.2* 8.0*   CBC: Recent Labs  Lab 03/14/18 0449 03/15/18 0103 03/15/18 1629 03/16/18 0629 03/17/18 0308  WBC 17.1* 15.2* 18.1* 15.3* 12.1*  NEUTROABS 14.5* 12.1*  --  12.4* 9.2*  HGB 8.2* 7.8* 9.8* 9.4* 9.1*  HCT 26.0* 25.0* 31.6* 29.6* 28.7*  MCV 93.2 92.3 92.7 90.8 90.8  PLT 406* 365 404* 377 352   Blood Culture    Component Value Date/Time   SDES BLOOD LEFT HAND 03/14/2018 1732   SPECREQUEST  03/14/2018 1732    BOTTLES DRAWN AEROBIC ONLY Blood Culture results may not be optimal due to an inadequate volume of blood received in culture bottles   CULT  03/14/2018 1732    NO GROWTH 2 DAYS Performed at [facility], [place] [address], [place], [place] [address]    REPTSTATUS PENDING  03/14/2018 1732   CBG: Recent Labs  Lab 03/15/18 2132 03/16/18 1226 03/16/18 1641 03/16/18 2105 03/17/18 0739  GLUCAP 112* 183* 145* 143* 138*    Lab Results  Component Value Date   INR 2.13 03/17/2018   INR 2.05 03/16/2018   INR 2.18 03/15/2018   Studies/Results: No results found.  Medications: . sodium chloride 250 mL (03/14/18 1747)  . sodium chloride Stopped (03/15/18 1140)  . vancomycin 750 mg (03/16/18 1023)   . aspirin EC  81 mg Oral Daily  . atorvastatin  40 mg Oral q1800  . bisacodyl  10 mg Oral Daily   Or  . bisacodyl  10 mg Rectal Daily  . Chlorhexidine Gluconate Cloth  6 each Topical Q0600  . citalopram  20 mg Oral Daily  . [START ON 03/19/2018] darbepoetin (ARANESP) injection - DIALYSIS  100 mcg Intravenous Q Mon-HD  . docusate sodium  200 mg Oral Daily  . dronabinol  2.5 mg Oral BID AC  . erythromycin  250 mg Oral TID  . feeding supplement (NEPRO CARB STEADY)  237 mL Oral TID BM  . insulin aspart  0-5 Units Subcutaneous QHS  . insulin aspart  0-9 Units Subcutaneous TID WC  .  isosorbide-hydrALAZINE  1.5 tablet Oral Q8H  . mouth rinse  15 mL Mouth Rinse BID  . metoCLOPramide (REGLAN) injection  5 mg Intravenous Q6H  . pantoprazole  40 mg Oral BID  . sodium chloride flush  10-40 mL Intracatheter Q12H  . sodium chloride flush  3 mL Intravenous Q12H  . warfarin  4 mg Oral ONCE-1800  . Warfarin - Pharmacist Dosing Inpatient   Does not apply q1800    Dialysis Orders: MWF [facility] 4h 52mn 3K/2Ca R AVG Hep none dry wt 80kg (with batteries) or 76.8kg (w/o batteries) - calc 0.5 ug tiw - mircera 50 q 2, last 6/26 - venofer 50 weekly  Home meds: - norvasc 2.5/ hydralazine 37.571mtid - ecasa/ lipitor/ sildenafil 20 tid/ coumadin 5 hs - PPI/ reglan 10 tid/ ativan prn/ marinol prn - lantus 10 u qd/ novolog 5 u tid   Assessment/Plan: 1. LVAD thrombosis - s/p LVAD exchange 6/27.  Per HF team 2. Intractable n/v 2/2 gastroparesis - improved off  amlodipine. On marinol, erythromycin, PPI, and IV reglan. 3. ESRD - MWF HD via RU AVG. CVVHD stopped 03/05/18.  K 3.9. HD tolerated well yesterday, net UF removed 2L. 4. Anemia of CKD- Hgb 9.1 today.  Aranesp 4067mgiven 7/8, ^100m71mor Monday. Transfused 1Unit pRBC 7/11, 5Unit total this admission. Iron studies pending. 5. Secondary hyperparathyroidism - Ca 8.0, need to check Phos. continue VDRA. Not on binders.  6. HTN/volume - BP soft. On BiDil. Amlodipine stopped d/t gastroparesis per cardio. Appears euvolemic on exam.  7. Nutrition - renal/carb modified diet.  8. DM - on insulin 9. Deconditioning - PT/OT following. [facility] Pager: [phone]/2019,11:36 AM  LOS: 17 days   Pt seen, examined and agree w A/P as above.  Rob Kelly SplinterCaroNewell Rubber[employer]ider [phone]/13/2019, 12:21 PM

## 2018-03-17 NOTE — Progress Notes (Signed)
Driveline dressing changed by l vad champion RN from [facility].

## 2018-03-17 NOTE — Progress Notes (Signed)
Pharmacy Antibiotic Note  [name] is a 52 y.o. male s/p VAD exchange (6/27) with fever Pharmacy has been consulted for vancomycin dosing. He is noted with ESRD on HD MWF.  Vancomycin given appropriately at end of HD session yesterday.  Cultures remain negative.  Afebrile. -post-op antibiotics stopped 7/2  Plan: -Continue vancomycin 750 mg IV q HD. -Will follow cultures and clinical progress   Height: 5' 8"  (172.7 cm) Weight: 169 lb 8.5 oz (76.9 kg) IBW/kg (Calculated) : 68.4  Temp (24hrs), Avg:98.7 F (37.1 C), Min:98 F (36.7 C), Max:99.2 F (37.3 C)  Recent Labs  Lab 03/13/18 0430 03/14/18 0449 03/15/18 0103 03/15/18 1629 03/16/18 0629 03/17/18 0308  WBC 13.1* 17.1* 15.2* 18.1* 15.3* 12.1*  CREATININE 4.66* 6.62* 4.26*  --  6.78* 4.37*    Estimated Creatinine Clearance: 18.9 mL/min (A) (by C-G formula based on SCr of 4.37 mg/dL (H)).    Allergies  Allergen Reactions  . Metformin And Related Diarrhea    Antimicrobials this admission:  Vancomycin 7/10 >   Dose adjustments this admission:  [place]   Microbiology results:  7/8 Bcx x2: NGTD 7/10 bld x2: NGTD 6/27 MRSA screen neg  Thank you for allowing pharmacy to be a part of this patient's care.  Marguerite Olea, [facility] Clinical Pharmacist Phone [phone]  03/17/2018 10:51 AM

## 2018-03-17 NOTE — Progress Notes (Signed)
Visited pt to offer Cardiac Rehab ambulation. Pt declined stating he ambulated  outside with family today.   Andi Hence, RN, BSN Cardiac Pulmonary Rehab

## 2018-03-17 NOTE — Progress Notes (Signed)
Patient ID: [name], male   DOB: Oct 23, 1965, 52 y.o.   MRN: [id number]     Advanced Heart Failure Rounding Note  PCP-Cardiologist: No primary care provider on file.   Subjective:    CVVHD stopped 03/05/18.   Tolerating iHD well (MWF). ICD turned back on 7/8  RAMP echo 7/9 speed increased to 5500  Afebrile, WBCs down to 12.  Blood cultures negative.  PCT 0.36.   LDH stable 204, INR 2.13, Hemoglobin 8.7 -> 8.2 -> 7.8 -> 1u PRBC -> 9.4 -> 9.1. Denies overt bleeding.   No further abdominal pain/nausea/vomiting.  Walking in halls.   CT chest/abd 03/13/18: 1. No specific explanation for fever. 2. Cluster of gas bubbles in the left anterior pleura or mediastinum, expected after recent surgery. 3. Atelectasis and trace effusions. 4. Good opacification of outflow cannula after interval revision. 5. Packed wound in the left abdominal wall without superimposed fluid collection or cellulitis.  LVAD Interrogation HM 3: Speed: 5500 Flow: 4.4 PI: 3.0 Power: 4.4. VAD interrogated personally. Parameters stable.  Objective:   Weight Range: 169 lb 8.5 oz (76.9 kg) Body mass index is 25.78 kg/m.   Vital Signs:   Temp:  [98 F (36.7 C)-99.2 F (37.3 C)] 98.6 F (37 C) (07/13 0740) Pulse Rate:  [94-103] 94 (07/13 0740) Resp:  [20-27] 20 (07/13 0333) BP: (98-141)/(80-101) 101/81 (07/13 0740) SpO2:  [95 %-99 %] 98 % (07/13 0740) Weight:  [169 lb 8.5 oz (76.9 kg)-175 lb 0.7 oz (79.4 kg)] 169 lb 8.5 oz (76.9 kg) (07/13 0333) Last BM Date: 03/16/18(per pt in the AM)  MAP 80s   Weight change: Filed Weights   03/16/18 0715 03/16/18 1138 03/17/18 0333  Weight: 179 lb 14.3 oz (81.6 kg) 175 lb 0.7 oz (79.4 kg) 169 lb 8.5 oz (76.9 kg)   Intake/Output:   Intake/Output Summary (Last 24 hours) at 03/17/2018 1022 Last data filed at 03/17/2018 0917 Gross per 24 hour  Intake 2166.34 ml  Output 2000 ml  Net 166.34 ml    Physical Exam   General: Well appearing this am. NAD.  HEENT:  Normal. Neck: Supple, JVP 7-8 cm. Carotids OK.  Cardiac:  Mechanical heart sounds with LVAD hum present.  Lungs:  CTAB, normal effort.  Abdomen:  NT, ND, no HSM. No bruits or masses. +BS  LVAD exit site: Well-healed and incorporated. Dressing dry and intact. No erythema or drainage. Stabilization device present and accurately applied. Driveline dressing changed daily per sterile technique. Extremities:  Warm and dry. No cyanosis, clubbing, rash, or edema.  Neuro:  Alert & oriented x 3. Cranial nerves grossly intact. Moves all 4 extremities w/o difficulty. Affect pleasant    Telemetry   NSR 90s, personally reviewed.    Labs    CBC Recent Labs    03/16/18 0629 03/17/18 0308  WBC 15.3* 12.1*  NEUTROABS 12.4* 9.2*  HGB 9.4* 9.1*  HCT 29.6* 28.7*  MCV 90.8 90.8  PLT 377 409   Basic Metabolic Panel Recent Labs    03/16/18 0629 03/17/18 0308  NA 135 136  K 3.9 3.9  CL 97* 97*  CO2 26 30  GLUCOSE 154* 147*  BUN 23* 13  CREATININE 6.78* 4.37*  CALCIUM 8.2* 8.0*   Liver Function Tests No results for input(s): AST, ALT, ALKPHOS, BILITOT, PROT, ALBUMIN in the last 72 hours. No results for input(s): LIPASE, AMYLASE in the last 72 hours. Cardiac Enzymes No results for input(s): CKTOTAL, CKMB, CKMBINDEX, TROPONINI in the last 72  hours.  BNP: BNP (last 3 results) Recent Labs    03/02/18 0400 03/08/18 0008 03/15/18 0106  BNP 694.1* 1,576.6* 811.3*    ProBNP (last 3 results) No results for input(s): PROBNP in the last 8760 hours.   D-Dimer No results for input(s): DDIMER in the last 72 hours. Hemoglobin A1C No results for input(s): HGBA1C in the last 72 hours. Fasting Lipid Panel No results for input(s): CHOL, HDL, LDLCALC, TRIG, CHOLHDL, LDLDIRECT in the last 72 hours. Thyroid Function Tests No results for input(s): TSH, T4TOTAL, T3FREE, THYROIDAB in the last 72 hours.  Invalid input(s): FREET3  Other results:   Imaging    No results  found.   Medications:     Scheduled Medications: . aspirin EC  81 mg Oral Daily  . atorvastatin  40 mg Oral q1800  . bisacodyl  10 mg Oral Daily   Or  . bisacodyl  10 mg Rectal Daily  . Chlorhexidine Gluconate Cloth  6 each Topical Q0600  . citalopram  20 mg Oral Daily  . [START ON 03/19/2018] darbepoetin (ARANESP) injection - DIALYSIS  100 mcg Intravenous Q Mon-HD  . docusate sodium  200 mg Oral Daily  . dronabinol  2.5 mg Oral BID AC  . erythromycin  250 mg Oral TID  . feeding supplement (NEPRO CARB STEADY)  237 mL Oral TID BM  . insulin aspart  0-5 Units Subcutaneous QHS  . insulin aspart  0-9 Units Subcutaneous TID WC  . isosorbide-hydrALAZINE  1.5 tablet Oral Q8H  . mouth rinse  15 mL Mouth Rinse BID  . metoCLOPramide (REGLAN) injection  5 mg Intravenous Q6H  . pantoprazole  40 mg Oral BID  . sodium chloride flush  10-40 mL Intracatheter Q12H  . sodium chloride flush  3 mL Intravenous Q12H  . Warfarin - Pharmacist Dosing Inpatient   Does not apply q1800    Infusions: . sodium chloride 250 mL (03/14/18 1747)  . sodium chloride Stopped (03/15/18 1140)  . 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 Post VAD course complicated by ESRD and severe DM gastroparesis. Admitted with low/no-flow alarms due to LVAD outflow graft kink (does not have bend clip).   Assessment/Plan   1. Cardiogenic shock due to LVAD complication with LVAD outflow graft thrombosis: s/p emergent LVAD pump exchange 6/27 for pump thrombosis in setting of LVAD graft kink (did not have bend clip).  Volume looks stable.  - Control volume with HD.  - Off milrinone, stable. No change.  2. VAD: s/p pump exchange 2/2 HM3 graft  kink.  Has HM3.  MAP 80-90. VAD interrogated personally. Parameters stable (had low voltage alarm because he let his battery run out).  LDH 204.  - Continue coumadin per pharmacy, INR goal 2-3.  Continue ASA 81.  - LDH stable 232 - Continue Bidil 1.5 tabs tid, take on non-HD days.  3. Intractable Nausea/Vomiting secondary to gastroparesis: Amlodipine stopped. Resolved.  - Continue reglan, phenergan, and ativan as needed - Continue home Marinol.  - Continue erythromycin 250 mg TID a day for gastroparesis  He has been seen by Dr Derrill Kay on 01/24/18 [facility] gastroparesis specialist).  Will stop erythromycin at discharge.  4. ESRD: CVVHD stopped 03/05/18. Tolerating iHD now,  gets MWF.  5. DM,insulin dependent - SSI for now. No change.  6. Acute respiratory failure: Extubated 6/28  Stable on RA - Up to chair and mobilize. As tolerated. PT/OT now recommending [place] PT. No change.  7. HTN:  Amlodipine seemed to cause gastroparesis symptoms.  MAP better in 80s-90s range.  - Continue bidil 1.5 tab TID.  8. ID: Afebrile over the last day, WBCs down to 13.  Cultures negative, PCT not elevated. CT chest/abd 7/8 with no source of infection. CXR 7/10 negative.  - Remains on vanc with HD, would give one more dose on Monday then stop.  9. Anemia: No overt bleeding. Hemoglobin 7.8 > 1 unit PRBC > 9.8 > 9.4 > 9.1.  10. Deconditioning: PT/OT following. Now recommending [place] PT. No change.  Plan Monday discharge per Dr. Prescott Gum if he remains stable over weekend.   I reviewed the LVAD parameters from today, and compared the results to the patient's prior recorded data.  No programming changes were made.  The LVAD is functioning within specified parameters.  The patient performs LVAD self-test daily.  LVAD interrogation was negative for any significant power changes, alarms or PI events/speed drops.  LVAD equipment check completed and is in good working order.  Back-up equipment present.   LVAD education done on emergency  procedures and precautions and reviewed exit site care.   Length of Stay: 59  Loralie Champagne, MD  03/17/2018, 10:22 AM  Advanced Heart Failure Team Pager [phone] (M-F; 7a - 4p)  Please contact [facility] for night-coverage after hours (4p -7a ) and weekends on [email or web address]

## 2018-03-18 LAB — PROTIME-INR
INR: 2.29
PROTHROMBIN TIME: 25.1 s — AB (ref 11.4–15.2)

## 2018-03-18 LAB — CBC WITH DIFFERENTIAL/PLATELET
BASOS PCT: 0 %
Basophils Absolute: 0 10*3/uL (ref 0.0–0.1)
Eosinophils Absolute: 0.5 10*3/uL (ref 0.0–0.7)
Eosinophils Relative: 4 %
HEMATOCRIT: 28.9 % — AB (ref 39.0–52.0)
Hemoglobin: 9.3 g/dL — ABNORMAL LOW (ref 13.0–17.0)
LYMPHS ABS: 1.3 10*3/uL (ref 0.7–4.0)
Lymphocytes Relative: 11 %
MCH: 29.3 pg (ref 26.0–34.0)
MCHC: 32.2 g/dL (ref 30.0–36.0)
MCV: 91.2 fL (ref 78.0–100.0)
MONO ABS: 1.3 10*3/uL — AB (ref 0.1–1.0)
MONOS PCT: 11 %
NEUTROS PCT: 74 %
Neutro Abs: 8.6 10*3/uL — ABNORMAL HIGH (ref 1.7–7.7)
PLATELETS: ADEQUATE 10*3/uL (ref 150–400)
RBC: 3.17 MIL/uL — ABNORMAL LOW (ref 4.22–5.81)
RDW: 15.7 % — AB (ref 11.5–15.5)
SMEAR REVIEW: ADEQUATE
WBC: 11.7 10*3/uL — ABNORMAL HIGH (ref 4.0–10.5)

## 2018-03-18 LAB — BASIC METABOLIC PANEL
ANION GAP: 9 (ref 5–15)
BUN: 32 mg/dL — ABNORMAL HIGH (ref 6–20)
CO2: 27 mmol/L (ref 22–32)
Calcium: 8.2 mg/dL — ABNORMAL LOW (ref 8.9–10.3)
Chloride: 99 mmol/L (ref 98–111)
Creatinine, Ser: 6.19 mg/dL — ABNORMAL HIGH (ref 0.61–1.24)
GFR calc Af Amer: 11 mL/min — ABNORMAL LOW (ref >60)
GFR calc non Af Amer: 9 mL/min — ABNORMAL LOW (ref >60)
Glucose, Bld: 133 mg/dL — ABNORMAL HIGH (ref 70–99)
POTASSIUM: 4.5 mmol/L (ref 3.5–5.1)
Sodium: 135 mmol/L (ref 135–145)

## 2018-03-18 LAB — PHOSPHORUS: Phosphorus: 2.2 mg/dL — ABNORMAL LOW (ref 2.5–4.6)

## 2018-03-18 LAB — GLUCOSE, CAPILLARY
GLUCOSE-CAPILLARY: 136 mg/dL — AB (ref 70–99)
Glucose-Capillary: 185 mg/dL — ABNORMAL HIGH (ref 70–99)
Glucose-Capillary: 264 mg/dL — ABNORMAL HIGH (ref 70–99)
Glucose-Capillary: 178 mg/dL — ABNORMAL HIGH (ref 70–99)

## 2018-03-18 LAB — LACTATE DEHYDROGENASE: LDH: 277 U/L — ABNORMAL HIGH (ref 98–192)

## 2018-03-18 MED ORDER — CHLORHEXIDINE GLUCONATE CLOTH 2 % EX PADS
6.0000 | MEDICATED_PAD | Freq: Every day | CUTANEOUS | Status: DC
  Administered 2018-03-18 – 2018-03-19 (×2): 6 via TOPICAL

## 2018-03-18 MED ORDER — LORAZEPAM 0.5 MG PO TABS
0.5000 mg | ORAL_TABLET | Freq: Once | ORAL | Status: AC
  Administered 2018-03-18: 0.5 mg via ORAL
  Filled 2018-03-18: qty 1

## 2018-03-18 MED ORDER — WARFARIN SODIUM 4 MG PO TABS
4.0000 mg | ORAL_TABLET | Freq: Once | ORAL | Status: AC
  Administered 2018-03-18: 4 mg via ORAL
  Filled 2018-03-18: qty 1

## 2018-03-18 NOTE — Progress Notes (Addendum)
[facility] Progress Note   Subjective:   Sleeping but easily awoken.  No new complaints.  Ready to go home tomorrow.  Objective Vitals:   03/18/18 0408 03/18/18 0500 03/18/18 0742 03/18/18 0958  BP: 106/79  (!) 114/95 115/79  Pulse: 93     Resp:   (!) 26   Temp: 99.1 F (37.3 C)  98.4 F (36.9 C)   TempSrc: Oral  Oral   SpO2: 97%  97%   Weight:  78.7 kg (173 lb 6.4 oz)    Height:       Physical Exam General:NAD, chronically ill appearing male Heart:+LVAD hum, mechanical heart sounds Lungs:nml WOB Extremities:no LE edema Dialysis Access: LU AVG   Filed Weights   03/16/18 1138 03/17/18 0333 03/18/18 0500  Weight: 79.4 kg (175 lb 0.7 oz) 76.9 kg (169 lb 8.5 oz) 78.7 kg (173 lb 6.4 oz)    Intake/Output Summary (Last 24 hours) at 03/18/2018 1036 Last data filed at 03/18/2018 0900 Gross per 24 hour  Intake 483 ml  Output 0 ml  Net 483 ml    Additional Objective Labs: Basic Metabolic Panel: Recent Labs  Lab 03/16/18 0629 03/17/18 0308 03/18/18 0559  NA 135 136 135  K 3.9 3.9 4.5  CL 97* 97* 99  CO2 26 30 27   GLUCOSE 154* 147* 133*  BUN 23* 13 32*  CREATININE 6.78* 4.37* 6.19*  CALCIUM 8.2* 8.0* 8.2*   CBC: Recent Labs  Lab 03/15/18 0103 03/15/18 1629 03/16/18 0629 03/17/18 0308 03/18/18 0559  WBC 15.2* 18.1* 15.3* 12.1* 11.7*  NEUTROABS 12.1*  --  12.4* 9.2* 8.6*  HGB 7.8* 9.8* 9.4* 9.1* 9.3*  HCT 25.0* 31.6* 29.6* 28.7* 28.9*  MCV 92.3 92.7 90.8 90.8 91.2  PLT 365 404* 377 352 PLATELETS APPEAR ADEQUATE   Blood Culture    Component Value Date/Time   SDES BLOOD LEFT HAND 03/14/2018 1732   SPECREQUEST  03/14/2018 1732    BOTTLES DRAWN AEROBIC ONLY Blood Culture results may not be optimal due to an inadequate volume of blood received in culture bottles   CULT  03/14/2018 1732    NO GROWTH 3 DAYS Performed at [facility], [place] [address], [place], [place] [address]    REPTSTATUS PENDING 03/14/2018 1732    CBG: Recent Labs   Lab 03/17/18 0739 03/17/18 1225 03/17/18 1659 03/17/18 2141 03/18/18 0749  GLUCAP 138* 208* 209* 190* 136*   Lab Results  Component Value Date   INR 2.29 03/18/2018   INR 2.13 03/17/2018   INR 2.05 03/16/2018   Studies/Results: No results found.  Medications: . sodium chloride 250 mL (03/14/18 1747)  . sodium chloride Stopped (03/15/18 1140)  . vancomycin 750 mg (03/16/18 1023)   . aspirin EC  81 mg Oral Daily  . atorvastatin  40 mg Oral q1800  . bisacodyl  10 mg Oral Daily   Or  . bisacodyl  10 mg Rectal Daily  . Chlorhexidine Gluconate Cloth  6 each Topical Q0600  . citalopram  20 mg Oral Daily  . [START ON 03/19/2018] darbepoetin (ARANESP) injection - DIALYSIS  100 mcg Intravenous Q Mon-HD  . docusate sodium  200 mg Oral Daily  . dronabinol  2.5 mg Oral BID AC  . erythromycin  250 mg Oral TID  . feeding supplement (NEPRO CARB STEADY)  237 mL Oral TID BM  . insulin aspart  0-5 Units Subcutaneous QHS  . insulin aspart  0-9 Units Subcutaneous TID WC  . isosorbide-hydrALAZINE  1.5 tablet Oral Q8H  . mouth rinse  15 mL Mouth Rinse BID  . metoCLOPramide (REGLAN) injection  5 mg Intravenous Q6H  . pantoprazole  40 mg Oral BID  . sodium chloride flush  10-40 mL Intracatheter Q12H  . sodium chloride flush  3 mL Intravenous Q12H  . warfarin  4 mg Oral ONCE-1800  . Warfarin - Pharmacist Dosing Inpatient   Does not apply q1800    Dialysis Orders: MWF [facility] 4h 47mn 3K/2Ca R AVG Hep none dry wt 80kg (withbatteries)or76.8kg (w/obatteries) - calc 0.5 ug tiw - mircera 50 q 2, last 6/26 - venofer 50 weekly  Home meds: - norvasc 2.5/ hydralazine 37.515mtid - ecasa/ lipitor/ sildenafil 20 tid/ coumadin 5 hs - PPI/ reglan 10 tid/ ativan prn/ marinol prn - lantus 10 u qd/ novolog 5 u tid   Assessment/Plan: 1.LVAD thrombosis - s/p LVAD exchange 6/27. Per HF team 2. Intractable n/v 2/2 gastroparesis - n/v resolved off amlodipine. On marinol, erythromycin,  PPI, and IV reglan. 3. ESRD -MWF HD via RU AVG. CVVHD stopped 03/05/18. K 4.5. NO indications for emergent HD today.  Continue per regular schedule, orders written.  To receive final dose vancomycin with HD Monday, then d/c per primary.  4. Anemia of CKD-Hgb 9.3 today. Aranesp 4063mgiven 7/8, ^100m33mor Monday. Transfused 1Unit pRBC 7/11, 5Unit total this admission. Iron studies pending. 5. Secondary hyperparathyroidism -Ca 8.8, checking Phos. continue VDRA. Not on binders.  6. HTN/volume -BP variable. On BiDil on non HD days. Amlodipine stopped d/t gastroparesis per cardio. Appears euvolemic on exam.  7. Nutrition -renal/carb modified diet.  8. DM - on insulin 9. Deconditioning - [facility]OT following[facility]. Dispo - plan to d/c Monday if he remains stable per primary    [facility] Pager: [phone]/2019,10:36 AM  LOS: 18 days   Pt seen, examined and agree w A/P as above.  Rob Kelly Splinter[facility] [employer] [phone]/14/2019, 12:45 PM

## 2018-03-18 NOTE — Progress Notes (Signed)
Gauze dressing dry and intact; anchor intact and accurately applied. Existing VAD dressing removed and site care performed using sterile technique. Drive line exit site cleaned with Chlora prep applicators x 2, allowed to dry, and gauzedressing with silver stripre-applied. Exit site healingand unincorporated, the velour is fully implanted at exit site. Stitch intact. Moderate amount of light brown drainage with no redness, tenderness, foul odor or rash noted.Drive line anchorsintactx 2.

## 2018-03-18 NOTE — Progress Notes (Signed)
ANTICOAGULATION CONSULT NOTE - Follow Up  Consult  Pharmacy Consult for warfarin Indication: LVAD  Allergies  Allergen Reactions  . Metformin And Related Diarrhea    Patient Measurements: Height: 5' 8"  (172.7 cm) Weight: 173 lb 6.4 oz (78.7 kg) IBW/kg (Calculated) : 68.4  Vital Signs: Temp: 98.4 F (36.9 C) (07/14 0742) Temp Source: Oral (07/14 0742) BP: 114/95 (07/14 0742) Pulse Rate: 93 (07/14 0408)  Labs: Recent Labs    03/16/18 0629 03/17/18 0308 03/18/18 0559  HGB 9.4* 9.1* 9.3*  HCT 29.6* 28.7* 28.9*  PLT 377 352 PLATELETS APPEAR ADEQUATE  LABPROT 22.9* 23.6* 25.1*  INR 2.05 2.13 2.29  CREATININE 6.78* 4.37* 6.19*    Estimated Creatinine Clearance: 13.4 mL/min (A) (by C-G formula based on SCr of 6.19 mg/dL (H)).   Medical History: Past Medical History:  Diagnosis Date  . Angina   . ASCVD (arteriosclerotic cardiovascular disease)    , Anterior infarction 2005, LAD diagonal bifurcation intervention 03/2004  . Automatic implantable cardiac defibrillator -[employer] Jude's       . Benign neoplasm of colon   . CHF (congestive heart failure) ([place])   . Chronic systolic heart failure ([place])   . Coronary artery disease     Widely patent previously placed stents in the left anterior   . Crohn's disease ([place])   . 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 Dialysis patient ([place])   . Dyspnea   . Gastroparesis   . GERD (gastroesophageal reflux disease)   . High cholesterol   . Hyperlipidemia   . Hypersomnolent    Previous diagnosis of narcolepsy  . Hypertension, essential   . Ischemic cardiomyopathy    Ejection fraction 15-20% catheterization 2010  . Type II or unspecified type diabetes mellitus without mention of complication, not stated as uncontrolled   . Unspecified gastritis and gastroduodenitis without mention of hemorrhage     Assessment: 56 yom on warfarin PTA for LVAD/factor V leiden who is now s/p LVAD change out for pump thrombus.  Previous warfarin regimen for goal 2-3 is 5 mg daily except 2.5 mg on Friday.    Restarted on erythromycin, which can impact INR sensitivity, for gastroparesis.    INR now within goal.  No overt bleeding or complications noted.    Goal of Therapy:  INR 2-3 Monitor platelets by anticoagulation protocol: Yes   Plan:  Will order warfarin 4 mg again tonight. Daily INR, CBC, and for s/sx of bleeding.  Marguerite Olea, [facility] Clinical Pharmacist Phone [phone]  03/18/2018 9:56 AM

## 2018-03-18 NOTE — Progress Notes (Signed)
Patient ID: [name], male   DOB: 14-Nov-1965, 52 y.o.   MRN: [id number]     Advanced Heart Failure Rounding Note  PCP-Cardiologist: No primary care provider on file.   Subjective:    CVVHD stopped 03/05/18.   Tolerating iHD well (MWF). ICD turned back on 7/8  RAMP echo 7/9 speed increased to 5500  Afebrile, WBCs down to 11.7.  Blood cultures negative.  PCT 0.36.   LDH stable 204, INR 2.13, Hemoglobin 8.7 -> 8.2 -> 7.8 -> 1u PRBC -> 9.4 -> 9.1 -> 9.3. Denies overt bleeding.   No further abdominal pain/nausea/vomiting.  Walking in halls.  Sleepy this morning, asking for Ativan regularly.   CT chest/abd 03/13/18: 1. No specific explanation for fever. 2. Cluster of gas bubbles in the left anterior pleura or mediastinum, expected after recent surgery. 3. Atelectasis and trace effusions. 4. Good opacification of outflow cannula after interval revision. 5. Packed wound in the left abdominal wall without superimposed fluid collection or cellulitis.  LVAD Interrogation HM 3: Speed: 5500 Flow: 4.2 PI: 3.6 Power: 4.4. VAD interrogated personally. Parameters stable.  Objective:   Weight Range: 173 lb 6.4 oz (78.7 kg) Body mass index is 26.37 kg/m.   Vital Signs:   Temp:  [98 F (36.7 C)-99.3 F (37.4 C)] 98.4 F (36.9 C) (07/14 0742) Pulse Rate:  [89-95] 93 (07/14 0408) Resp:  [10-26] 26 (07/14 0742) BP: (92-114)/(73-95) 114/95 (07/14 0742) SpO2:  [97 %-100 %] 97 % (07/14 0742) Weight:  [173 lb 6.4 oz (78.7 kg)] 173 lb 6.4 oz (78.7 kg) (07/14 0500) Last BM Date: 03/17/18  MAP 80s-90s  Weight change: Filed Weights   03/16/18 1138 03/17/18 0333 03/18/18 0500  Weight: 175 lb 0.7 oz (79.4 kg) 169 lb 8.5 oz (76.9 kg) 173 lb 6.4 oz (78.7 kg)   Intake/Output:   Intake/Output Summary (Last 24 hours) at 03/18/2018 0952 Last data filed at 03/18/2018 0900 Gross per 24 hour  Intake 483 ml  Output 0 ml  Net 483 ml    Physical Exam   General: Well appearing this am. NAD.   Sleepy.   HEENT: Normal. Neck: Supple, JVP 7-8 cm. Carotids OK.  Cardiac:  Mechanical heart sounds with LVAD hum present.  Lungs:  CTAB, normal effort.  Abdomen:  NT, ND, no HSM. No bruits or masses. +BS  LVAD exit site: Well-healed and incorporated. Dressing dry and intact. No erythema or drainage. Stabilization device present and accurately applied. Driveline dressing changed daily per sterile technique. Extremities:  Warm and dry. No cyanosis, clubbing, rash, or edema.  Neuro:  Alert & oriented x 3. Cranial nerves grossly intact. Moves all 4 extremities w/o difficulty. Affect pleasant    Telemetry   NSR 80s, personally reviewed.    Labs    CBC Recent Labs    03/17/18 0308 03/18/18 0559  WBC 12.1* 11.7*  NEUTROABS 9.2* 8.6*  HGB 9.1* 9.3*  HCT 28.7* 28.9*  MCV 90.8 91.2  PLT 352 PLATELETS APPEAR ADEQUATE   Basic Metabolic Panel Recent Labs    03/17/18 0308 03/18/18 0559  NA 136 135  K 3.9 4.5  CL 97* 99  CO2 30 27  GLUCOSE 147* 133*  BUN 13 32*  CREATININE 4.37* 6.19*  CALCIUM 8.0* 8.2*   Liver Function Tests No results for input(s): AST, ALT, ALKPHOS, BILITOT, PROT, ALBUMIN in the last 72 hours. No results for input(s): LIPASE, AMYLASE in the last 72 hours. Cardiac Enzymes No results for input(s): CKTOTAL, CKMB, CKMBINDEX,  TROPONINI in the last 72 hours.  BNP: BNP (last 3 results) Recent Labs    03/02/18 0400 03/08/18 0008 03/15/18 0106  BNP 694.1* 1,576.6* 811.3*    ProBNP (last 3 results) No results for input(s): PROBNP in the last 8760 hours.   D-Dimer No results for input(s): DDIMER in the last 72 hours. Hemoglobin A1C No results for input(s): HGBA1C in the last 72 hours. Fasting Lipid Panel No results for input(s): CHOL, HDL, LDLCALC, TRIG, CHOLHDL, LDLDIRECT in the last 72 hours. Thyroid Function Tests No results for input(s): TSH, T4TOTAL, T3FREE, THYROIDAB in the last 72 hours.  Invalid input(s): FREET3  Other results:   Imaging      No results found.   Medications:     Scheduled Medications: . aspirin EC  81 mg Oral Daily  . atorvastatin  40 mg Oral q1800  . bisacodyl  10 mg Oral Daily   Or  . bisacodyl  10 mg Rectal Daily  . Chlorhexidine Gluconate Cloth  6 each Topical Q0600  . citalopram  20 mg Oral Daily  . [START ON 03/19/2018] darbepoetin (ARANESP) injection - DIALYSIS  100 mcg Intravenous Q Mon-HD  . docusate sodium  200 mg Oral Daily  . dronabinol  2.5 mg Oral BID AC  . erythromycin  250 mg Oral TID  . feeding supplement (NEPRO CARB STEADY)  237 mL Oral TID BM  . insulin aspart  0-5 Units Subcutaneous QHS  . insulin aspart  0-9 Units Subcutaneous TID WC  . isosorbide-hydrALAZINE  1.5 tablet Oral Q8H  . mouth rinse  15 mL Mouth Rinse BID  . metoCLOPramide (REGLAN) injection  5 mg Intravenous Q6H  . pantoprazole  40 mg Oral BID  . sodium chloride flush  10-40 mL Intracatheter Q12H  . sodium chloride flush  3 mL Intravenous Q12H  . Warfarin - Pharmacist Dosing Inpatient   Does not apply q1800    Infusions: . sodium chloride 250 mL (03/14/18 1747)  . sodium chloride Stopped (03/15/18 1140)  . 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 Post VAD course complicated by ESRD and severe DM gastroparesis. Admitted with low/no-flow alarms due to LVAD outflow graft kink (does not have bend clip).   Assessment/Plan   1. Cardiogenic shock due to LVAD complication with LVAD outflow graft thrombosis: s/p emergent LVAD pump exchange 6/27 for pump thrombosis in setting of LVAD graft kink (did not have bend clip).  Volume looks stable.  - Control volume with HD.  - Off milrinone, stable. No change.  2. VAD: s/p pump  exchange 2/2 HM3 graft kink.  Has HM3.  MAP 80-90. VAD interrogated personally. Parameters stable (had low voltage alarm because he let his battery run out).  LDH 204.  - Continue coumadin per pharmacy, INR goal 2-3.  Continue ASA 81.  - LDH stable 277 - Continue Bidil 1.5 tabs tid, take on non-HD days.  3. Intractable Nausea/Vomiting secondary to gastroparesis: Amlodipine stopped. Resolved.  - Continue reglan, phenergan. - Continue home Marinol.  - Continue erythromycin 250 mg TID a day for gastroparesis  He has been seen by Dr Derrill Kay on 01/24/18 [facility] gastroparesis specialist).  Will stop erythromycin at discharge.  - Nausea has resolved and eating  normally, still asking for Ativan and getting very sleepy.  I am going to stop Ativan.  4. ESRD: CVVHD stopped 03/05/18. Tolerating iHD now, gets MWF.  5. DM,insulin dependent - SSI for now. No change.  6. Acute respiratory failure: Extubated 6/28, stable on RA.  7. HTN:  Amlodipine seemed to cause gastroparesis symptoms.  MAP better in 80s-90s range.  - Continue bidil 1.5 tab TID.  8. ID: Afebrile still, WBCs down to 11/7.  Cultures negative, PCT not elevated. CT chest/abd 7/8 with no source of infection. CXR 7/10 negative.  - Remains on vanc with HD, would give one more dose on Monday then stop.  9. Anemia: No overt bleeding. Hemoglobin 7.8 > 1 unit PRBC > 9.8 > 9.4 > 9.1 > 9.3.  10. Deconditioning: PT/OT following. Now recommending [place] PT. No change.  Plan Monday discharge per Dr. Prescott Gum if he remains stable.   I reviewed the LVAD parameters from today, and compared the results to the patient's prior recorded data.  No programming changes were made.  The LVAD is functioning within specified parameters.  The patient performs LVAD self-test daily.  LVAD interrogation was negative for any significant power changes, alarms or PI events/speed drops.  LVAD equipment check completed and is in good working order.  Back-up equipment present.   LVAD  education done on emergency procedures and precautions and reviewed exit site care.   Length of Stay: 54  Loralie Champagne, MD  03/18/2018, 9:52 AM  Advanced Heart Failure Team Pager [phone] (M-F; 7a - 4p)  Please contact [facility] for night-coverage after hours (4p -7a ) and weekends on [email or web address]

## 2018-03-19 ENCOUNTER — Encounter (HOSPITAL_COMMUNITY): Payer: Self-pay | Admitting: Unknown Physician Specialty

## 2018-03-19 ENCOUNTER — Ambulatory Visit (HOSPITAL_COMMUNITY): Payer: Self-pay

## 2018-03-19 LAB — GLUCOSE, CAPILLARY: GLUCOSE-CAPILLARY: 133 mg/dL — AB (ref 70–99)

## 2018-03-19 LAB — BASIC METABOLIC PANEL
ANION GAP: 12 (ref 5–15)
BUN: 47 mg/dL — AB (ref 6–20)
CHLORIDE: 98 mmol/L (ref 98–111)
CO2: 25 mmol/L (ref 22–32)
Calcium: 8.4 mg/dL — ABNORMAL LOW (ref 8.9–10.3)
Creatinine, Ser: 7.86 mg/dL — ABNORMAL HIGH (ref 0.61–1.24)
GFR calc Af Amer: 8 mL/min — ABNORMAL LOW (ref >60)
GFR calc non Af Amer: 7 mL/min — ABNORMAL LOW (ref >60)
Glucose, Bld: 152 mg/dL — ABNORMAL HIGH (ref 70–99)
POTASSIUM: 4.3 mmol/L (ref 3.5–5.1)
SODIUM: 135 mmol/L (ref 135–145)

## 2018-03-19 LAB — PROTIME-INR
INR: 2.3
Prothrombin Time: 25.1 seconds — ABNORMAL HIGH (ref 11.4–15.2)

## 2018-03-19 LAB — CBC WITH DIFFERENTIAL/PLATELET
ABS IMMATURE GRANULOCYTES: 0.1 10*3/uL (ref 0.0–0.1)
BASOS ABS: 0 10*3/uL (ref 0.0–0.1)
Basophils Relative: 0 %
Eosinophils Absolute: 0.4 10*3/uL (ref 0.0–0.7)
Eosinophils Relative: 3 %
HCT: 26.9 % — ABNORMAL LOW (ref 39.0–52.0)
HEMOGLOBIN: 8.7 g/dL — AB (ref 13.0–17.0)
IMMATURE GRANULOCYTES: 1 %
LYMPHS PCT: 10 %
Lymphs Abs: 1.2 10*3/uL (ref 0.7–4.0)
MCH: 29 pg (ref 26.0–34.0)
MCHC: 32.3 g/dL (ref 30.0–36.0)
MCV: 89.7 fL (ref 78.0–100.0)
MONO ABS: 1.2 10*3/uL — AB (ref 0.1–1.0)
MONOS PCT: 10 %
NEUTROS PCT: 76 %
Neutro Abs: 9.1 10*3/uL — ABNORMAL HIGH (ref 1.7–7.7)
Platelets: 332 10*3/uL (ref 150–400)
RBC: 3 MIL/uL — ABNORMAL LOW (ref 4.22–5.81)
RDW: 15.3 % (ref 11.5–15.5)
WBC: 12 10*3/uL — ABNORMAL HIGH (ref 4.0–10.5)

## 2018-03-19 LAB — CULTURE, BLOOD (ROUTINE X 2)
CULTURE: NO GROWTH
CULTURE: NO GROWTH

## 2018-03-19 LAB — LACTATE DEHYDROGENASE: LDH: 240 U/L — ABNORMAL HIGH (ref 98–192)

## 2018-03-19 MED ORDER — CITALOPRAM HYDROBROMIDE 20 MG PO TABS: 20.0000 mg | ORAL_TABLET | Freq: Every day | ORAL | 6 refills | Status: DC

## 2018-03-19 MED ORDER — LIDOCAINE-PRILOCAINE 2.5-2.5 % EX CREA: 1.0000 "application " | TOPICAL_CREAM | CUTANEOUS | Status: DC | PRN

## 2018-03-19 MED ORDER — WARFARIN SODIUM 5 MG PO TABS: 5.0000 mg | ORAL_TABLET | Freq: Every day | ORAL | 6 refills | Status: DC

## 2018-03-19 MED ORDER — LORAZEPAM 0.5 MG PO TABS: 0.5000 mg | ORAL_TABLET | Freq: Two times a day (BID) | ORAL | 0 refills | Status: DC | PRN

## 2018-03-19 MED ORDER — HEPARIN SODIUM (PORCINE) 1000 UNIT/ML DIALYSIS: 1000.0000 [IU] | INTRAMUSCULAR | Status: DC | PRN

## 2018-03-19 MED ORDER — SODIUM CHLORIDE 0.9 % IV SOLN: 100.0000 mL | INTRAVENOUS | Status: DC | PRN

## 2018-03-19 MED ORDER — ALTEPLASE 2 MG IJ SOLR: 2.0000 mg | Freq: Once | INTRAMUSCULAR | Status: DC | PRN

## 2018-03-19 MED ORDER — LIDOCAINE HCL (PF) 1 % IJ SOLN: 5.0000 mL | INTRAMUSCULAR | Status: DC | PRN

## 2018-03-19 MED ORDER — DARBEPOETIN ALFA 100 MCG/0.5ML IJ SOSY
PREFILLED_SYRINGE | INTRAMUSCULAR | Status: AC
  Filled 2018-03-19: qty 0.5

## 2018-03-19 MED ORDER — ALPRAZOLAM 0.5 MG PO TABS
ORAL_TABLET | ORAL | Status: AC
  Filled 2018-03-19: qty 1

## 2018-03-19 MED ORDER — VANCOMYCIN HCL IN DEXTROSE 750-5 MG/150ML-% IV SOLN
INTRAVENOUS | Status: AC
  Filled 2018-03-19: qty 150

## 2018-03-19 MED ORDER — WARFARIN SODIUM 5 MG PO TABS
5.0000 mg | ORAL_TABLET | Freq: Once | ORAL | Status: DC
  Filled 2018-03-19: qty 1

## 2018-03-19 MED ORDER — ALPRAZOLAM 0.5 MG PO TABS
0.2500 mg | ORAL_TABLET | Freq: Once | ORAL | Status: AC
  Administered 2018-03-19: 0.25 mg via ORAL

## 2018-03-19 MED ORDER — ISOSORB DINITRATE-HYDRALAZINE 20-37.5 MG PO TABS: 1.5000 | ORAL_TABLET | Freq: Three times a day (TID) | ORAL | 6 refills | Status: DC

## 2018-03-19 MED ORDER — PENTAFLUOROPROP-TETRAFLUOROETH EX AERO: 1.0000 "application " | INHALATION_SPRAY | CUTANEOUS | Status: DC | PRN

## 2018-03-19 NOTE — Progress Notes (Signed)
LVAD Coordinator Rounding Note:  Admitted 02/28/18 from the ED for 0 Flow from ED. Pt was taken emergently to the OR for outflow graft twist/obstruction.  HM III LVAD implanted on 06/12/17 by Dr. Prescott Gum under Destination Therapy criteria due to renal insufficiency. Pump exchange on 03/01/18 for outflow graft twist/obstruction.   Pt was seen in dailysis unit today. Pt has a VAD trained dialysis nurse.   Vital signs: Temp: 98.4 HR:  81 Doppler Pressure:  95 Auto BP:  95/66 O2 Sat: 99% on RA Wt: 176>166>193>180>185>184>190>189>187>188>176>174>170>173>175>169 lbs  LVAD interrogation reveals:  Speed: 5500 Flow: 4.2 Power: 4.7w PI: 3.3 Alarms: none  Events:  none Hematocrit: 30 Fixed speed: 5500 Low speed limit: 5200  Drive Line:   Gauze dressing dry and intact; anchor intact and accurately applied. Existing VAD dressing removed and site care performed using sterile technique. Drive line exit site cleaned with Chlora prep applicators x 2, allowed to dry, and gauzedressing with silver stripre-applied. Exit site healingand unincorporated, the velour is fully implanted at exit site. Stitch intact. Small amount of serous drainage with no redness, tenderness, foul odor or rash noted.Drive line anchorsintactx 2.      Removed iodoform from oldleft abdominaldriveline site and repacked with 1" iodoform using sterile technique. Covered with a 4 x 4 and tape.   Labs:  LDH trend: 384>253>241>304>217>207>197>206>201>213>212>213>232>240  INR trend: 1.70>3.07>4.13>2.37>1.95>1.68>1.54>1.48>2.83>2.98>2.88>2.18>2.05>2.30  Blood Products: Intra-op: 03/01/18 - 5 PRBCs     2 PLT     2 FFP     2 Cryo  Gtts: Milrinone 0.125 mcg/kg/min (stopped 03/07/18) Levo 12 mcg/min (stopped 03/04/18) Neo 25 mcg/min (stopped 03/03/18) Epi 1 mcg/min (stopped 03/03/18)  Anticoagulation Plan: -INR Goal: 2.0 - 3.0 -ASA Dose: 81 mg daily   Device: -St Jude dual  -Therapies: currently off - Monitor:  on VT 150 bpm  Adverse Events on VAD: - ESRD post LVAD; started CVVH 06/15/17; HD started 06/20/17 - Hospitalization 11/5 - 07/16/17 > gastroparesis diagnosis after EGD - Hospitalization 11/26 - 08/04/17 > gastroparesis - Hospitalization 1/8 - 09/16/17>gastroparesis - referred to Dr. Corliss Parish at [facility] - 10/31/17>rightupper arm arteriovenous graft with Artegraft; ligation of right first stage brachial vein transposition  - 12/11/13 elective admission for thrombectomy for AV graft in RUA  - 12/20/17 > intractable N/V - 01/19/18 > intractable N/V sent by EMS from dialysis center - 03/01/18 > outflow graft twist/occulsion requiring pump exchange   Plan/Recommendations:  1. Dressing change with old DL site packed daily by VAD Coordinator, Nurse Davonna Belling, or trained caregiver.  2. Please page VAD coordinator with any equipment questions or concerns. 3. Pt can discharge to home.  [name] RN, VAD Coordinator 24/7 VAD Pager: [phone]

## 2018-03-19 NOTE — Progress Notes (Signed)
OT Cancellation Note  Patient Details Name: LOVIS MORE MRN: [id number] DOB: [birth year]   Cancelled Treatment:    Reason Eval/Treat Not Completed: Patient at procedure or test/ unavailable(HD)  Malka So 03/19/2018, 8:52 AM  03/19/2018 [name], OTR/L Pager: [phone]

## 2018-03-19 NOTE — Discharge Instructions (Signed)

## 2018-03-19 NOTE — Progress Notes (Signed)
ANTICOAGULATION CONSULT NOTE - Follow Up  Consult  Pharmacy Consult for warfarin Indication: LVAD  Allergies  Allergen Reactions  . Metformin And Related Diarrhea    Patient Measurements: Height: 5' 8"  (172.7 cm) Weight: 176 lb 5.9 oz (80 kg)(Standing weight) IBW/kg (Calculated) : 68.4  Vital Signs: Temp: 99.5 F (37.5 C) (07/15 0735) Temp Source: Oral (07/15 0735) BP: 94/47 (07/15 1000) Pulse Rate: 70 (07/15 1000)  Labs: Recent Labs    03/17/18 0308 03/18/18 0559 03/19/18 0243  HGB 9.1* 9.3* 8.7*  HCT 28.7* 28.9* 26.9*  PLT 352 PLATELETS APPEAR ADEQUATE 332  LABPROT 23.6* 25.1* 25.1*  INR 2.13 2.29 2.30  CREATININE 4.37* 6.19* 7.86*    Estimated Creatinine Clearance: 10.5 mL/min (A) (by C-G formula based on SCr of 7.86 mg/dL (H)).   Medical History: Past Medical History:  Diagnosis Date  . Angina   . ASCVD (arteriosclerotic cardiovascular disease)    , Anterior infarction 2005, LAD diagonal bifurcation intervention 03/2004  . Automatic implantable cardiac defibrillator -St. Jude's       . Benign neoplasm of colon   . CHF (congestive heart failure) ([place])   . Chronic systolic heart failure ([place])   . Coronary artery disease     Widely patent previously placed stents in the left anterior   . Crohn's disease ([place])   . 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 Dialysis patient ([place])   . Dyspnea   . Gastroparesis   . GERD (gastroesophageal reflux disease)   . High cholesterol   . Hyperlipidemia   . Hypersomnolent    Previous diagnosis of narcolepsy  . Hypertension, essential   . Ischemic cardiomyopathy    Ejection fraction 15-20% catheterization 2010  . Type II or unspecified type diabetes mellitus without mention of complication, not stated as uncontrolled   . Unspecified gastritis and gastroduodenitis without mention of hemorrhage     Assessment: 39 yoM on warfarin PTA for LVAD/factor V leiden who is now s/p LVAD exchange for pump  thrombus. INR therapeutic today at 2.3 today. Of note, erythromycin was started for gastroparesis which may increase warfarin sensitivity but this will stop at discharge.  Previous warfarin regimen for goal 2-3 is 5 mg daily except 2.5 mg on Friday. Recent outpatient INRs were therapeutic prior to admission, but pt appeared more sensitive this admission. Recommend reducing outpatient dose slightly to 18m daily except 2.524mTuesday/Friday with INR follow-up Thursday in [facility].    Goal of Therapy:  INR 2-3 Monitor platelets by anticoagulation protocol: Yes   Plan:  -Warfarin 28m38mO x1 tonight -If discharged, would utilize regimen noted above with INR F/U thursday -INR tomorrow am if pt remains admitted  MicArrie SenateharmD, BCPS Clinical Pharmacist [phone] check AMION for all [facility] 03/19/2018

## 2018-03-19 NOTE — Care Management Note (Addendum)
Case Management Note  Patient Details  Name: [name] MRN: [id number] Date of Birth: 1965-10-17  Subjective/Objective:    Pt admitted with LVAD pump thrombus                Action/Plan:   PTA independent from home.  Pt in agreement with HHPT - pt chose [facility] - agency accepted referral and HF orders.  Pt informed CM that his family will provide 24 hour supervision.   Expected Discharge Date:  03/19/18               Expected Discharge Plan:  IP Rehab Facility  In-House Referral:  Clinical Social Work  Discharge planning Services  CM Consult  Post Acute Care Choice:    Choice offered to:  Patient  DME Arranged:    DME Agency:     HH Arranged:  PT HH Agency:     Status of Service:  In process, will continue to follow  If discussed at Long Length of Stay Meetings, dates discussed:    Additional Comments: 03/19/2018 Pt to discharge home today with daughter - daughter and son will provide 24 hour supervision at discharge.  HF team called Bidil prescription into [facility] pharmacy 03/16/18.  HF team also provided pt ample supply of Bildil samples to provide coverage until mail order prescription arrives from [facility].  CM also provided pt direct phone number for pharmacy.   Pt confirmed he is aware and able to pay copay for Bidil.   Maryclare Labrador, RN 03/19/2018, 11:16 AM

## 2018-03-19 NOTE — Progress Notes (Addendum)
Patient ID: [name], male   DOB: 07-Feb-1966, 52 y.o.   MRN: [id number]     Advanced Heart Failure Rounding Note  PCP-Cardiologist: No primary care provider on file.   Subjective:    CVVHD stopped 03/05/18. Tolerating iHD well (MWF).   ICD turned back on 7/8  RAMP echo 7/9 speed increased to 5500  Tmax 99.3, WBCs 12.0.  Blood cultures negative. PCT 0.36 7/13.   LDH stable 240, INR 2.30, Hemoglobin 8.7 -> 8.2 -> 7.8 -> 1u PRBC -> 9.4 -> 9.1 -> 9.3 -> 8.7. Denies bleeding.  Denies CP, SOB, fever, or chills. Nausea has been stable. He feels anxious and is asking for ativan prescription for discharge.  CT chest/abd 03/13/18: 1. No specific explanation for fever. 2. Cluster of gas bubbles in the left anterior pleura or mediastinum, expected after recent surgery. 3. Atelectasis and trace effusions. 4. Good opacification of outflow cannula after interval revision. 5. Packed wound in the left abdominal wall without superimposed fluid collection or cellulitis.  LVAD Interrogation HM 3: Speed: 5500 Flow: 4.3 PI: 3.3 Power: 5. No monitor available. VAD interrogated personally. Parameters stable.  Objective:   Weight Range: 176 lb 6.4 oz (80 kg) Body mass index is 26.82 kg/m.   Vital Signs:   Temp:  [98.4 F (36.9 C)-99.3 F (37.4 C)] 98.7 F (37.1 C) (07/15 0419) Pulse Rate:  [90-94] 90 (07/15 0419) Resp:  [20-33] 33 (07/15 0419) BP: (100-127)/(76-101) 113/76 (07/15 0419) SpO2:  [97 %-98 %] 98 % (07/15 0419) Weight:  [176 lb 6.4 oz (80 kg)] 176 lb 6.4 oz (80 kg) (07/15 0500) Last BM Date: 03/18/18  MAP 80-100s  Weight change: Filed Weights   03/17/18 0333 03/18/18 0500 03/19/18 0500  Weight: 169 lb 8.5 oz (76.9 kg) 173 lb 6.4 oz (78.7 kg) 176 lb 6.4 oz (80 kg)   Intake/Output:   Intake/Output Summary (Last 24 hours) at 03/19/2018 0734 Last data filed at 03/18/2018 2156 Gross per 24 hour  Intake 363 ml  Output 0 ml  Net 363 ml    Physical Exam   General:  Well appearing this am. NAD.  HEENT: Normal. Neck: Supple, JVP 7-8 cm. Carotids OK.  Cardiac:  Mechanical heart sounds with LVAD hum present.  Lungs:  CTAB, normal effort.  Abdomen:  NT, ND, no HSM. No bruits or masses. +BS  LVAD exit site: Dressing dry and intact. No erythema or drainage. Stabilization device present and accurately applied.  Extremities:  Warm and dry. No cyanosis, clubbing, rash, or edema.  Neuro:  Alert & oriented x 3. Cranial nerves grossly intact. Moves all 4 extremities w/o difficulty. Affect pleasant    Telemetry   NSR 90s Personally reviewed.   Labs    CBC Recent Labs    03/18/18 0559 03/19/18 0243  WBC 11.7* 12.0*  NEUTROABS 8.6* 9.1*  HGB 9.3* 8.7*  HCT 28.9* 26.9*  MCV 91.2 89.7  PLT PLATELETS APPEAR ADEQUATE 774   Basic Metabolic Panel Recent Labs    03/18/18 0559 03/19/18 0243  NA 135 135  K 4.5 4.3  CL 99 98  CO2 27 25  GLUCOSE 133* 152*  BUN 32* 47*  CREATININE 6.19* 7.86*  CALCIUM 8.2* 8.4*  PHOS 2.2*  --    Liver Function Tests No results for input(s): AST, ALT, ALKPHOS, BILITOT, PROT, ALBUMIN in the last 72 hours. No results for input(s): LIPASE, AMYLASE in the last 72 hours. Cardiac Enzymes No results for input(s): CKTOTAL, CKMB, CKMBINDEX, TROPONINI  in the last 72 hours.  BNP: BNP (last 3 results) Recent Labs    03/02/18 0400 03/08/18 0008 03/15/18 0106  BNP 694.1* 1,576.6* 811.3*    ProBNP (last 3 results) No results for input(s): PROBNP in the last 8760 hours.   D-Dimer No results for input(s): DDIMER in the last 72 hours. Hemoglobin A1C No results for input(s): HGBA1C in the last 72 hours. Fasting Lipid Panel No results for input(s): CHOL, HDL, LDLCALC, TRIG, CHOLHDL, LDLDIRECT in the last 72 hours. Thyroid Function Tests No results for input(s): TSH, T4TOTAL, T3FREE, THYROIDAB in the last 72 hours.  Invalid input(s): FREET3  Other results:   Imaging    No results found.   Medications:      Scheduled Medications: . aspirin EC  81 mg Oral Daily  . atorvastatin  40 mg Oral q1800  . bisacodyl  10 mg Oral Daily   Or  . bisacodyl  10 mg Rectal Daily  . Chlorhexidine Gluconate Cloth  6 each Topical Q0600  . citalopram  20 mg Oral Daily  . darbepoetin (ARANESP) injection - DIALYSIS  100 mcg Intravenous Q Mon-HD  . docusate sodium  200 mg Oral Daily  . dronabinol  2.5 mg Oral BID AC  . erythromycin  250 mg Oral TID  . feeding supplement (NEPRO CARB STEADY)  237 mL Oral TID BM  . insulin aspart  0-5 Units Subcutaneous QHS  . insulin aspart  0-9 Units Subcutaneous TID WC  . isosorbide-hydrALAZINE  1.5 tablet Oral Q8H  . mouth rinse  15 mL Mouth Rinse BID  . metoCLOPramide (REGLAN) injection  5 mg Intravenous Q6H  . pantoprazole  40 mg Oral BID  . sodium chloride flush  10-40 mL Intracatheter Q12H  . sodium chloride flush  3 mL Intravenous Q12H  . Warfarin - Pharmacist Dosing Inpatient   Does not apply q1800    Infusions: . sodium chloride 250 mL (03/14/18 1747)  . sodium chloride Stopped (03/15/18 1140)  . vancomycin 750 mg (03/16/18 1023)    PRN Medications: hydrALAZINE, ondansetron (ZOFRAN) IV, oxyCODONE, promethazine, sodium chloride flush, sodium chloride flush, traMADol    Patient Profile   [name] is a 52 year old with a history of CAD and ischemic cardiomyopathy now s/p Heartmate 3 LVAD on 10/18. Post VAD course complicated by ESRD and severe DM gastroparesis. Admitted with low/no-flow alarms due to LVAD outflow graft kink (does not have bend clip).   Assessment/Plan   1. Cardiogenic shock due to LVAD complication with LVAD outflow graft thrombosis: s/p emergent LVAD pump exchange 6/27 for pump thrombosis in setting of LVAD graft kink (did not have bend clip).  Volume looks stable. - Control volume with HD.  - Off milrinone, stable.  2. VAD: s/p pump exchange 2/2 HM3 graft kink.  Has HM3.  MAP 80-90. VAD interrogated personally. Parameters stable (had  low voltage alarm because he let his battery run out).  - Continue coumadin per pharmacy, INR goal 2-3. Continue ASA 81. INR 2.3 - LDH stable 240 - Continue Bidil 1.5 tabs tid, take on non-HD days.  3. Intractable Nausea/Vomiting secondary to gastroparesis: Amlodipine stopped. Resolved.   - Continue reglan, phenergan. - Continue home Marinol.  - Continue erythromycin 250 mg TID a day for gastroparesis  He has been seen by Dr Derrill Kay on 01/24/18 [facility] gastroparesis specialist).  Will stop erythromycin at discharge.  - Nausea has resolved and eating normally. He is still asking for ativan for anxiety (he is on  citalopram).  4. ESRD: CVVHD stopped 03/05/18. Tolerating iHD now, gets MWF. No change.  5. DM,insulin dependent - SSI for now. No change.  6. Acute respiratory failure: Extubated 6/28, stable on RA. No change.  7. HTN:  Amlodipine seemed to cause gastroparesis symptoms.  MAP better in 80s-100s range.  - Continue bidil 1.5 tab TID.  8. ID: Remains afebrile, WBCs 12.0.  Cultures negative, PCT not elevated. CT chest/abd 7/8 with no source of infection. CXR 7/10 negative.  - Remains on vanc with HD, would give one more dose today then stop. No change.  9. Anemia: No overt bleeding. Hemoglobin 7.8 > 1 unit PRBC > 9.8 > 9.4 > 9.1 > 9.3 > 8.7.  10. Deconditioning: PT/OT following. Now recommending [place] PT. No change  Possible discharge today.   I reviewed the LVAD parameters from today, and compared the results to the patient's prior recorded data.  No programming changes were made.  The LVAD is functioning within specified parameters.  The patient performs LVAD self-test daily.  LVAD interrogation was negative for any significant power changes, alarms or PI events/speed drops.  LVAD equipment check completed and is in good working order.  Back-up equipment present.   LVAD education done on emergency procedures and precautions and reviewed exit site care.   Length of Stay: [id number] NP   03/19/2018, 7:34 AM  Advanced Heart Failure Team Pager [phone] (M-F; 7a - 4p)  Please contact [facility] for night-coverage after hours (4p -7a ) and weekends on [email or web address]  Patient seen with NP, agree with the above note.  He is stable today, afebrile.  LDH ok and INR therapeutic.  Getting HD today.  Stop vancomycin after today's dose.  Can stop erythromycin at discharge.   Asking for Ativan for "anxiety."    I think that he can go home today.  Will followup VAD clinic Friday.  HD MWF as prior.  Meds for home: warfarin per pharmacy, lorazepam 0.5 mg q12 prn nausea (only give 10 pills, make sure he understands that it is for nausea), Bidil 1.5 tab tid on non-HD days, ASA 81, citalopram 20 daily, Marinol and Reglan to be continued as prior to admission.   Bevan Disney 03/19/2018 10:10 AM

## 2018-03-19 NOTE — Procedures (Signed)
I was present at this dialysis session. I have reviewed the session itself and made appropriate changes.   Seen on HD, 3K bath, 3L UF goal using AVG.  No c/o from pt. OK for discharge back to HD unit today.    Filed Weights   03/18/18 0500 03/19/18 0500 03/19/18 0735  Weight: 78.7 kg (173 lb 6.4 oz) 80 kg (176 lb 6.4 oz) 80 kg (176 lb 5.9 oz)    Recent Labs  Lab 03/18/18 0559 03/19/18 0243  NA 135 135  K 4.5 4.3  CL 99 98  CO2 27 25  GLUCOSE 133* 152*  BUN 32* 47*  CREATININE 6.19* 7.86*  CALCIUM 8.2* 8.4*  PHOS 2.2*  --     Recent Labs  Lab 03/17/18 0308 03/18/18 0559 03/19/18 0243  WBC 12.1* 11.7* 12.0*  NEUTROABS 9.2* 8.6* 9.1*  HGB 9.1* 9.3* 8.7*  HCT 28.7* 28.9* 26.9*  MCV 90.8 91.2 89.7  PLT 352 PLATELETS APPEAR ADEQUATE 332    Scheduled Meds: . ALPRAZolam      . aspirin EC  81 mg Oral Daily  . atorvastatin  40 mg Oral q1800  . bisacodyl  10 mg Oral Daily   Or  . bisacodyl  10 mg Rectal Daily  . Chlorhexidine Gluconate Cloth  6 each Topical Q0600  . citalopram  20 mg Oral Daily  . darbepoetin (ARANESP) injection - DIALYSIS  100 mcg Intravenous Q Mon-HD  . docusate sodium  200 mg Oral Daily  . dronabinol  2.5 mg Oral BID AC  . erythromycin  250 mg Oral TID  . feeding supplement (NEPRO CARB STEADY)  237 mL Oral TID BM  . insulin aspart  0-5 Units Subcutaneous QHS  . insulin aspart  0-9 Units Subcutaneous TID WC  . isosorbide-hydrALAZINE  1.5 tablet Oral Q8H  . mouth rinse  15 mL Mouth Rinse BID  . metoCLOPramide (REGLAN) injection  5 mg Intravenous Q6H  . pantoprazole  40 mg Oral BID  . sodium chloride flush  10-40 mL Intracatheter Q12H  . sodium chloride flush  3 mL Intravenous Q12H  . Warfarin - Pharmacist Dosing Inpatient   Does not apply q1800   Continuous Infusions: . sodium chloride 250 mL (03/14/18 1747)  . sodium chloride Stopped (03/15/18 1140)  . vancomycin 750 mg (03/16/18 1023)   PRN Meds:.hydrALAZINE, ondansetron (ZOFRAN) IV,  oxyCODONE, promethazine, sodium chloride flush, sodium chloride flush, traMADol   Pearson Grippe  MD 03/19/2018, 8:55 AM

## 2018-03-19 NOTE — Progress Notes (Signed)
Patient discharged home with daughter, VAD equipment and personal belongings packed. Paperwork reviewed, prescription given. IV removed.

## 2018-03-19 NOTE — Progress Notes (Signed)
PT Cancellation Note  Patient Details Name: [name] MRN: [id number] DOB: November 21, 1965   Cancelled Treatment:    Reason Eval/Treat Not Completed: Patient at procedure or test/unavailable(HD)   [name] 03/19/2018, 7:35 AM  [name], [place]

## 2018-03-21 ENCOUNTER — Ambulatory Visit (HOSPITAL_COMMUNITY): Payer: Self-pay

## 2018-03-21 ENCOUNTER — Telehealth (HOSPITAL_COMMUNITY): Payer: Self-pay

## 2018-03-21 NOTE — Telephone Encounter (Signed)
Pt insurance is active and benefits verified through [place]. Co-pay $30.00, DED $0.00/$0.00 met, out of pocket $3,000.00/$3,000.00 met, co-insurance 0%. No pre-authorization required. Passport, 03/21/18 @ 8:51AM, [id number]  Will contact patient to see if he is interested in the Cardiac Rehab Program. If interested, patient will need to complete follow up appt. Once completed, patient will be contacted for scheduling upon review by the RN Navigator.

## 2018-03-22 ENCOUNTER — Encounter (HOSPITAL_COMMUNITY): Payer: Self-pay

## 2018-03-22 ENCOUNTER — Ambulatory Visit (HOSPITAL_COMMUNITY): Payer: Self-pay | Admitting: Pharmacist

## 2018-03-22 ENCOUNTER — Ambulatory Visit (HOSPITAL_COMMUNITY)
Admission: RE | Admit: 2018-03-22 | Discharge: 2018-03-22 | Disposition: A | Discharge status: Home / Self Care | Payer: BLUE CROSS/BLUE SHIELD | Source: Ambulatory Visit | Attending: Cardiology | Admitting: Cardiology

## 2018-03-22 ENCOUNTER — Telehealth (HOSPITAL_COMMUNITY): Payer: Self-pay | Admitting: *Deleted

## 2018-03-22 DIAGNOSIS — Z95811 Presence of heart assist device: Secondary | ICD-10-CM

## 2018-03-22 DIAGNOSIS — T829XXA Unspecified complication of cardiac and vascular prosthetic device, implant and graft, initial encounter: Secondary | ICD-10-CM

## 2018-03-22 DIAGNOSIS — K3184 Gastroparesis: Secondary | ICD-10-CM

## 2018-03-22 DIAGNOSIS — Z5181 Encounter for therapeutic drug level monitoring: Secondary | ICD-10-CM

## 2018-03-22 DIAGNOSIS — N186 End stage renal disease: Secondary | ICD-10-CM

## 2018-03-22 LAB — PROTIME-INR
INR: 1.94
Prothrombin Time: 21.9 seconds — ABNORMAL HIGH (ref 11.4–15.2)

## 2018-03-22 MED ORDER — HYDRALAZINE HCL 20 MG/ML IJ SOLN
10.0000 mg | Freq: Once | INTRAMUSCULAR | Status: AC
  Administered 2018-03-22: 10 mg via INTRAVENOUS
  Filled 2018-03-22: qty 0.5

## 2018-03-22 MED ORDER — ERYTHROMYCIN BASE 250 MG PO TABS: 250.0000 mg | ORAL_TABLET | Freq: Three times a day (TID) | ORAL | 0 refills | Status: DC

## 2018-03-22 MED ORDER — PROMETHAZINE HCL 25 MG/ML IJ SOLN
25.0000 mg | Freq: Once | INTRAMUSCULAR | Status: AC
  Administered 2018-03-22: 12.5 mg via INTRAVENOUS
  Filled 2018-03-22: qty 1

## 2018-03-22 MED ORDER — HYDRALAZINE HCL 20 MG/ML IJ SOLN
20.0000 mg | Freq: Once | INTRAMUSCULAR | Status: AC
  Administered 2018-03-22: 10 mg via INTRAVENOUS
  Filled 2018-03-22: qty 1

## 2018-03-22 NOTE — Progress Notes (Signed)
Pt paged VAD pager with c/o N/V. Pt states he is unable to take his meds. Pt presents to clinic with friend. PI 9-10, BP 166/133. Attempted to start an IV x 1. IV team consulted-failed attempt. Per Dr. Joelyn Oms pts AV graft on the RUA was accessed by dialysis nurse to use for infusion. Pt was given 1 L of NS, IV phenergan and IV hydralazine. VAD parameters normalized to a PI of 6.5 and BP came down to 118/88. Pt was sent home and instructed to take his PO meds and to use his Phenergan suppositories if needed.   [name] RN, BSN VAD Coordinator 24/7 Pager [phone]

## 2018-03-22 NOTE — Telephone Encounter (Signed)
Pt called VAD pager to report he hasn't been feeling well since Tuesday (discharged home Monday, erythromycin stopped). Says he has been taking Ativan with little relief.   Reports he hasn't been able to eat or drink due to nausea and vomiting when he tried. He did go to dialysis yesterday, and says they "weren't able to pull much fluid".  VAD parameters: speed 5500 RPM, Flow 3.5, PI 8.3, Power 4.8, no alarms that he is aware of.  He has not been taking Marinol or Reglan, thought these needed to be taken with food and he hasn't been able to eat. Instructed him to take both as scheduled (even without food). He has PO and rectal phenergan at home, but hasn't tried yet. Asked him to get phenergan suppository or PO started so he can take and keep down the Reglan and Marinol.  Dr. Aundra Dubin updated - called pt with following instructions:  1. Re-start Erythromycin 250 mg three times daily (pt needs Rx)  2. No dialysis tomorrow (I will contact dialysis center)  3. Come to VAD clinic for IV fluid bolus (500 cc NS). Pt has someone who will bring him within        Next 30 mins.   Spoke with [facility] Dialysis nurse and PA, they recommend not skipping dialysis tomorrow, but will not pull any fluid and can give him fluid if needed.   Spoke with pt - he feels he needs to come here for IV fluid today, doesn't feel he can wait until tomorrow.   Zada Girt RN, [place] Coordinator [phone]

## 2018-03-23 ENCOUNTER — Inpatient Hospital Stay (HOSPITAL_COMMUNITY)
Admission: AD | Admit: 2018-03-23 | Discharge: 2018-03-25 | DRG: 291 | Disposition: A | Discharge status: Home / Self Care | Payer: BLUE CROSS/BLUE SHIELD | Source: Ambulatory Visit | Attending: Cardiology | Admitting: Cardiology

## 2018-03-23 ENCOUNTER — Inpatient Hospital Stay (HOSPITAL_COMMUNITY): Payer: BLUE CROSS/BLUE SHIELD

## 2018-03-23 ENCOUNTER — Emergency Department (HOSPITAL_COMMUNITY): Admission: EM | Admit: 2018-03-23 | Discharge: 2018-03-23 | Payer: BLUE CROSS/BLUE SHIELD | Source: Home / Self Care

## 2018-03-23 ENCOUNTER — Ambulatory Visit (HOSPITAL_COMMUNITY): Payer: Self-pay

## 2018-03-23 DIAGNOSIS — N186 End stage renal disease: Secondary | ICD-10-CM | POA: Diagnosis not present

## 2018-03-23 DIAGNOSIS — I2510 Atherosclerotic heart disease of native coronary artery without angina pectoris: Secondary | ICD-10-CM | POA: Diagnosis present

## 2018-03-23 DIAGNOSIS — Z794 Long term (current) use of insulin: Secondary | ICD-10-CM

## 2018-03-23 DIAGNOSIS — E1122 Type 2 diabetes mellitus with diabetic chronic kidney disease: Secondary | ICD-10-CM | POA: Diagnosis present

## 2018-03-23 DIAGNOSIS — I509 Heart failure, unspecified: Secondary | ICD-10-CM

## 2018-03-23 DIAGNOSIS — D638 Anemia in other chronic diseases classified elsewhere: Secondary | ICD-10-CM | POA: Diagnosis present

## 2018-03-23 DIAGNOSIS — Z9049 Acquired absence of other specified parts of digestive tract: Secondary | ICD-10-CM

## 2018-03-23 DIAGNOSIS — D72829 Elevated white blood cell count, unspecified: Secondary | ICD-10-CM | POA: Diagnosis present

## 2018-03-23 DIAGNOSIS — Z79899 Other long term (current) drug therapy: Secondary | ICD-10-CM | POA: Diagnosis not present

## 2018-03-23 DIAGNOSIS — Z833 Family history of diabetes mellitus: Secondary | ICD-10-CM

## 2018-03-23 DIAGNOSIS — K219 Gastro-esophageal reflux disease without esophagitis: Secondary | ICD-10-CM | POA: Diagnosis present

## 2018-03-23 DIAGNOSIS — Z992 Dependence on renal dialysis: Secondary | ICD-10-CM

## 2018-03-23 DIAGNOSIS — I10 Essential (primary) hypertension: Secondary | ICD-10-CM | POA: Diagnosis not present

## 2018-03-23 DIAGNOSIS — I160 Hypertensive urgency: Secondary | ICD-10-CM | POA: Diagnosis not present

## 2018-03-23 DIAGNOSIS — N2581 Secondary hyperparathyroidism of renal origin: Secondary | ICD-10-CM | POA: Diagnosis present

## 2018-03-23 DIAGNOSIS — I252 Old myocardial infarction: Secondary | ICD-10-CM | POA: Diagnosis not present

## 2018-03-23 DIAGNOSIS — Z792 Long term (current) use of antibiotics: Secondary | ICD-10-CM | POA: Diagnosis not present

## 2018-03-23 DIAGNOSIS — E8889 Other specified metabolic disorders: Secondary | ICD-10-CM | POA: Diagnosis present

## 2018-03-23 DIAGNOSIS — K5090 Crohn's disease, unspecified, without complications: Secondary | ICD-10-CM | POA: Diagnosis present

## 2018-03-23 DIAGNOSIS — Z7982 Long term (current) use of aspirin: Secondary | ICD-10-CM | POA: Diagnosis not present

## 2018-03-23 DIAGNOSIS — E785 Hyperlipidemia, unspecified: Secondary | ICD-10-CM | POA: Diagnosis present

## 2018-03-23 DIAGNOSIS — K3184 Gastroparesis: Secondary | ICD-10-CM

## 2018-03-23 DIAGNOSIS — I5023 Acute on chronic systolic (congestive) heart failure: Secondary | ICD-10-CM | POA: Diagnosis not present

## 2018-03-23 DIAGNOSIS — Z95811 Presence of heart assist device: Secondary | ICD-10-CM

## 2018-03-23 DIAGNOSIS — I255 Ischemic cardiomyopathy: Secondary | ICD-10-CM | POA: Diagnosis present

## 2018-03-23 DIAGNOSIS — I132 Hypertensive heart and chronic kidney disease with heart failure and with stage 5 chronic kidney disease, or end stage renal disease: Secondary | ICD-10-CM | POA: Diagnosis present

## 2018-03-23 DIAGNOSIS — E1143 Type 2 diabetes mellitus with diabetic autonomic (poly)neuropathy: Secondary | ICD-10-CM | POA: Diagnosis present

## 2018-03-23 DIAGNOSIS — E118 Type 2 diabetes mellitus with unspecified complications: Secondary | ICD-10-CM

## 2018-03-23 DIAGNOSIS — Z888 Allergy status to other drugs, medicaments and biological substances status: Secondary | ICD-10-CM

## 2018-03-23 DIAGNOSIS — Z95810 Presence of automatic (implantable) cardiac defibrillator: Secondary | ICD-10-CM

## 2018-03-23 DIAGNOSIS — Z8249 Family history of ischemic heart disease and other diseases of the circulatory system: Secondary | ICD-10-CM

## 2018-03-23 LAB — COMPREHENSIVE METABOLIC PANEL
ALT: 16 U/L (ref 0–44)
ANION GAP: 16 — AB (ref 5–15)
AST: 19 U/L (ref 15–41)
Albumin: 3 g/dL — ABNORMAL LOW (ref 3.5–5.0)
Alkaline Phosphatase: 115 U/L (ref 38–126)
BILIRUBIN TOTAL: 1.2 mg/dL (ref 0.3–1.2)
BUN: 25 mg/dL — ABNORMAL HIGH (ref 6–20)
CO2: 23 mmol/L (ref 22–32)
Calcium: 8.6 mg/dL — ABNORMAL LOW (ref 8.9–10.3)
Chloride: 100 mmol/L (ref 98–111)
Creatinine, Ser: 6.01 mg/dL — ABNORMAL HIGH (ref 0.61–1.24)
GFR calc Af Amer: 11 mL/min — ABNORMAL LOW (ref >60)
GFR, EST NON AFRICAN AMERICAN: 10 mL/min — AB (ref >60)
GLUCOSE: 105 mg/dL — AB (ref 70–99)
Potassium: 3.7 mmol/L (ref 3.5–5.1)
Sodium: 139 mmol/L (ref 135–145)
TOTAL PROTEIN: 6.7 g/dL (ref 6.5–8.1)

## 2018-03-23 LAB — PROTIME-INR
INR: 2.85
PROTHROMBIN TIME: 29.7 s — AB (ref 11.4–15.2)

## 2018-03-23 LAB — LACTATE DEHYDROGENASE: LDH: 187 U/L (ref 98–192)

## 2018-03-23 MED ORDER — ATORVASTATIN CALCIUM 40 MG PO TABS
40.0000 mg | ORAL_TABLET | Freq: Every day | ORAL | Status: DC
  Administered 2018-03-23 – 2018-03-24 (×2): 40 mg via ORAL
  Filled 2018-03-23 (×2): qty 1

## 2018-03-23 MED ORDER — ACETAMINOPHEN 325 MG PO TABS: 650.0000 mg | ORAL_TABLET | ORAL | Status: DC | PRN

## 2018-03-23 MED ORDER — ASPIRIN EC 81 MG PO TBEC
81.0000 mg | DELAYED_RELEASE_TABLET | Freq: Every day | ORAL | Status: DC
  Administered 2018-03-23 – 2018-03-25 (×3): 81 mg via ORAL
  Filled 2018-03-23 (×3): qty 1

## 2018-03-23 MED ORDER — INSULIN ASPART 100 UNIT/ML ~~LOC~~ SOLN
5.0000 [IU] | Freq: Three times a day (TID) | SUBCUTANEOUS | Status: DC
  Administered 2018-03-24 – 2018-03-25 (×2): 5 [IU] via SUBCUTANEOUS

## 2018-03-23 MED ORDER — ERYTHROMYCIN BASE 250 MG PO TABS: 250.0000 mg | ORAL_TABLET | Freq: Three times a day (TID) | ORAL | Status: DC

## 2018-03-23 MED ORDER — PROMETHAZINE HCL 25 MG PO TABS: 12.5000 mg | ORAL_TABLET | Freq: Four times a day (QID) | ORAL | Status: DC | PRN

## 2018-03-23 MED ORDER — INSULIN GLARGINE 100 UNIT/ML ~~LOC~~ SOLN
10.0000 [IU] | Freq: Every day | SUBCUTANEOUS | Status: DC
  Filled 2018-03-23: qty 0.1

## 2018-03-23 MED ORDER — WARFARIN SODIUM 5 MG PO TABS
5.0000 mg | ORAL_TABLET | Freq: Once | ORAL | Status: AC
  Administered 2018-03-23: 5 mg via ORAL
  Filled 2018-03-23: qty 1

## 2018-03-23 MED ORDER — DOCUSATE SODIUM 100 MG PO CAPS
100.0000 mg | ORAL_CAPSULE | Freq: Two times a day (BID) | ORAL | Status: DC
  Administered 2018-03-23 – 2018-03-25 (×4): 100 mg via ORAL
  Filled 2018-03-23 (×4): qty 1

## 2018-03-23 MED ORDER — DARBEPOETIN ALFA 100 MCG/0.5ML IJ SOSY: 100.0000 ug | PREFILLED_SYRINGE | INTRAMUSCULAR | Status: DC

## 2018-03-23 MED ORDER — INSULIN GLARGINE 100 UNIT/ML ~~LOC~~ SOLN
10.0000 [IU] | Freq: Every day | SUBCUTANEOUS | Status: DC
  Administered 2018-03-23: 10 [IU] via SUBCUTANEOUS
  Filled 2018-03-23: qty 0.1

## 2018-03-23 MED ORDER — ISOSORB DINITRATE-HYDRALAZINE 20-37.5 MG PO TABS
1.5000 | ORAL_TABLET | Freq: Three times a day (TID) | ORAL | Status: DC
  Administered 2018-03-23 – 2018-03-25 (×4): 1.5 via ORAL
  Filled 2018-03-23 (×4): qty 2

## 2018-03-23 MED ORDER — ERYTHROMYCIN BASE 250 MG PO TABS
250.0000 mg | ORAL_TABLET | Freq: Three times a day (TID) | ORAL | Status: DC
  Administered 2018-03-23 – 2018-03-25 (×5): 250 mg via ORAL
  Filled 2018-03-23 (×7): qty 1

## 2018-03-23 MED ORDER — LORAZEPAM 2 MG/ML IJ SOLN
INTRAMUSCULAR | Status: AC
  Administered 2018-03-23: 1 mg via INTRAVENOUS
  Filled 2018-03-23: qty 1

## 2018-03-23 MED ORDER — ONDANSETRON HCL 4 MG/2ML IJ SOLN: 4.0000 mg | Freq: Four times a day (QID) | INTRAMUSCULAR | Status: DC | PRN

## 2018-03-23 MED ORDER — DRONABINOL 2.5 MG PO CAPS
2.5000 mg | ORAL_CAPSULE | Freq: Two times a day (BID) | ORAL | Status: DC
  Administered 2018-03-23 – 2018-03-25 (×4): 2.5 mg via ORAL
  Filled 2018-03-23 (×4): qty 1

## 2018-03-23 MED ORDER — PANTOPRAZOLE SODIUM 40 MG PO TBEC
40.0000 mg | DELAYED_RELEASE_TABLET | Freq: Two times a day (BID) | ORAL | Status: DC
  Administered 2018-03-23 – 2018-03-25 (×4): 40 mg via ORAL
  Filled 2018-03-23 (×4): qty 1

## 2018-03-23 MED ORDER — LORAZEPAM 2 MG/ML IJ SOLN
1.0000 mg | Freq: Once | INTRAMUSCULAR | Status: AC
  Administered 2018-03-23: 1 mg via INTRAVENOUS

## 2018-03-23 MED ORDER — METOCLOPRAMIDE HCL 10 MG PO TABS
10.0000 mg | ORAL_TABLET | Freq: Three times a day (TID) | ORAL | Status: DC
  Administered 2018-03-23 – 2018-03-25 (×5): 10 mg via ORAL
  Filled 2018-03-23 (×6): qty 1

## 2018-03-23 MED ORDER — CITALOPRAM HYDROBROMIDE 20 MG PO TABS
20.0000 mg | ORAL_TABLET | Freq: Every day | ORAL | Status: DC
  Administered 2018-03-23 – 2018-03-25 (×3): 20 mg via ORAL
  Filled 2018-03-23 (×3): qty 1

## 2018-03-23 MED ORDER — CHLORHEXIDINE GLUCONATE CLOTH 2 % EX PADS
6.0000 | MEDICATED_PAD | Freq: Every day | CUTANEOUS | Status: DC
  Administered 2018-03-24 – 2018-03-25 (×2): 6 via TOPICAL

## 2018-03-23 MED ORDER — WARFARIN - PHARMACIST DOSING INPATIENT: Freq: Every day | Status: DC

## 2018-03-23 MED ORDER — LORAZEPAM 0.5 MG PO TABS: 0.5000 mg | ORAL_TABLET | Freq: Two times a day (BID) | ORAL | Status: DC | PRN

## 2018-03-23 MED ORDER — NITROGLYCERIN IN D5W 200-5 MCG/ML-% IV SOLN
0.0000 ug/min | INTRAVENOUS | Status: DC
  Administered 2018-03-23: 50 ug/min via INTRAVENOUS
  Filled 2018-03-23: qty 250

## 2018-03-23 MED ORDER — ONDANSETRON 4 MG PO TBDP: 4.0000 mg | ORAL_TABLET | Freq: Four times a day (QID) | ORAL | Status: DC | PRN

## 2018-03-23 MED ORDER — DARBEPOETIN ALFA 100 MCG/0.5ML IJ SOSY
100.0000 ug | PREFILLED_SYRINGE | INTRAMUSCULAR | Status: DC
  Administered 2018-03-24: 100 ug via INTRAVENOUS
  Filled 2018-03-23: qty 0.5

## 2018-03-23 NOTE — Progress Notes (Signed)
Notified MD to update him NTG gtt turned off doppler MAP in the 60's. Patient stable at this time. I will continue to monitor.

## 2018-03-23 NOTE — Procedures (Signed)
Central lin placement  Indication: NTG infusion in a fluid overloaded patient with no vascular access  Procedure: After performing a time out, the area over the right IJ was prepped with chlorhexidine and widely draped. Sterile garb was donned, and the IJ identified by ultrasound and easily cannulated. A wire was gently passed.  Lidocaine was instilled in the skin before sharp incision and dilation of the tract. A 20 cm 7FR triple lumen catheter was placed over the wire to 15 Cm at the skin. There was good flow form all ports. Significant oozing was controlled with local pressure.  A CXR for line placement is pending.

## 2018-03-23 NOTE — H&P (Addendum)
Advanced Heart Failure VAD History and Physical Note   PCP-Cardiologist: No primary care provider on file.   Reason for Admission: ESRD/ HTN  HPI:    Ramzey E Mccleery is a 52 y.o. male with a history of CAD and ischemic cardiomyopathy now s/p Heartmate 3 LVADon 10/18. Post VAD course complicated byESRD and severe DM gastroparesis. Had emergent VAD exchanged 03/01/18 in the setting of outflow graft kink.    Recently admitted 6/26 - 03/19/18 for emergent VAD exchanged 03/01/18 in the setting of outflow graft kink. He required CVVHD post op, but was transtioned back to [facility] without problems. Course complicated by leukocytosis and fever. CT abd/chest negative for infection and blood cultures were negative. Post op pain well controlled with rare tramadol. Able to tolerate diet. Evaluated by PT/OT and HH PT recommended for discharge. Ramp echo completed prior to discharge with speed optimization. Resumed 81 mg ASA + coumadin with INR goal 2-3. Discharge delayed by recurrent fever and leukocytosis. Blood cultures remained negative.   Seen in CHF clinic 03/21/18 with N/V and inability to take any of his medications. BP 166/133, given IV hydralazine. Were unable to get IV access so consulted with renal who came to clinic and accessed his AV graft. Given 1 L of NS, IV phenergan, and IV hydralazine as above. VAD parameters normalized to a PI of 6 and BP came down to 118/88. Sent home with instructions to resume po meds and use his phenergan suppositories as needed.   Pt states he woke up this morning feeling better, but throughout the day felt worse. He reported for dialysis and was noted to have SBPs in 170s (MAPs> 120s) and reportedly was in resp distress.  Arranged for patient to be directly admitted. Pt is more SOB than baseline, but stable.  BP per EMS maintaining in 026V systolic. He complains of his normal N/V. PIs back up to 8.8-9.1 range. No PI events noted. Decision made to admit for further  evaluation and treatment.   LVAD Interrogation HM 3 Speed: 5500 Flow: 3.3 PI: 8.8 Power: 4.6. No PI events since 03/2015  Review of systems complete and found to be negative unless listed in HPI.    Home Medications Prior to Admission medications   Medication Sig Start Date End Date Taking? Authorizing Provider  aspirin EC 81 MG tablet Take 1 tablet (81 mg total) by mouth daily. 07/26/17   Larey Dresser, MD  atorvastatin (LIPITOR) 40 MG tablet Take 1 tablet (40 mg total) by mouth daily at 6 PM. 01/25/18   [name], MD  citalopram (CELEXA) 20 MG tablet Take 1 tablet (20 mg total) by mouth daily. 03/19/18   Georgiana Shore, NP  docusate sodium (COLACE) 100 MG capsule Take 2 capsules (200 mg total) by mouth daily. Patient taking differently: Take 100 mg by mouth 2 (two) times daily.  07/03/17   Clegg, Amy D, NP  dronabinol (MARINOL) 2.5 MG capsule Take 1 capsule (2.5 mg total) by mouth 2 (two) times daily before lunch and supper. 01/30/18   Larey Dresser, MD  erythromycin (E-MYCIN) 250 MG tablet Take 1 tablet (250 mg total) by mouth 3 (three) times daily. 03/22/18   Larey Dresser, MD  insulin aspart (NOVOLOG) 100 UNIT/ML injection Inject 5 Units into the skin 3 (three) times daily with meals. 01/22/18   Georgiana Shore, NP  insulin glargine (LANTUS) 100 UNIT/ML injection Inject 0.1 mLs (10 Units total) into the skin daily. 01/22/18   Tamala Julian,  Renato Gails, NP  isosorbide-hydrALAZINE (BIDIL) 20-37.5 MG tablet Take 1.5 tablets by mouth every 8 (eight) hours. Take 1.5 tablets every 8 hours on non dialysis days. 03/19/18   Georgiana Shore, NP  LORazepam (ATIVAN) 0.5 MG tablet Take 1 tablet (0.5 mg total) by mouth every 12 (twelve) hours as needed (nausea). 03/19/18   Georgiana Shore, NP  metoCLOPramide (REGLAN) 10 MG tablet Take 1 tablet (10 mg total) by mouth 3 (three) times daily. 01/22/18   Georgiana Shore, NP  ondansetron (ZOFRAN ODT) 4 MG disintegrating tablet Take 1 tablet (4 mg total)  by mouth every 6 (six) hours as needed for nausea or vomiting. 01/10/17   Esterwood, Amy S, PA-C  pantoprazole (PROTONIX) 40 MG tablet Take 40 mg by mouth 2 (two) times daily.     [provider]  promethazine (PHENERGAN) 12.5 MG tablet Take 1 tablet (12.5 mg total) by mouth every 6 (six) hours as needed for nausea or vomiting. 09/16/17   Daune Perch, NP    Past Medical History: Past Medical History:  Diagnosis Date  . Angina   . ASCVD (arteriosclerotic cardiovascular disease)    , Anterior infarction 2005, LAD diagonal bifurcation intervention 03/2004  . Automatic implantable cardiac defibrillator -St. Jude's       . Benign neoplasm of colon   . CHF (congestive heart failure) ([place])   . Chronic systolic heart failure ([place])   . Coronary artery disease     Widely patent previously placed stents in the left anterior   . Crohn's disease ([place])   . 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 Dialysis patient ([place])   . Dyspnea   . Gastroparesis   . GERD (gastroesophageal reflux disease)   . High cholesterol   . Hyperlipidemia   . Hypersomnolent    Previous diagnosis of narcolepsy  . Hypertension, essential   . Ischemic cardiomyopathy    Ejection fraction 15-20% catheterization 2010  . Type II or unspecified type diabetes mellitus without mention of complication, not stated as uncontrolled   . Unspecified gastritis and gastroduodenitis without mention of hemorrhage     Past Surgical History: Past Surgical History:  Procedure Laterality Date  . APPENDECTOMY    . AV FISTULA PLACEMENT Right 06/26/2017   Procedure: ARTERIOVENOUS (AV) FISTULA CREATION VERSUS GRAFT RIGHT  ARM;  Surgeon: Conrad Buena Vista, MD;  Location: [facility] OR;  Service: Vascular;  Laterality: Right;  . AV FISTULA PLACEMENT Right 10/31/2017   Procedure: INSERTION OF ARTERIOVENOUS (AV) ARTEGRAFT,  RIGHT UPPER ARM;  Surgeon: Conrad Wildrose, MD;  Location: [place];  Service: Vascular;  Laterality: Right;  .  CARDIAC DEFIBRILLATOR PLACEMENT  2010   St. Jude ICD  . COLECTOMY  ~ 2003   "for Crohn's" ascending colon.    . CORONARY STENT INTERVENTION N/A 11/25/2016   Procedure: Coronary Stent Intervention;  Surgeon: Leonie Man, MD;  Location: [place] CV LAB;  Service: Cardiovascular;  Laterality: N/A;  . EP IMPLANTABLE DEVICE N/A 09/14/2016   Procedure: ICD Generator Changeout;  Surgeon: Deboraha Sprang, MD;  Location: [place] [facility];  Service: Cardiovascular;  Laterality: N/A;  . ESOPHAGOGASTRODUODENOSCOPY N/A 07/12/2017   Procedure: ESOPHAGOGASTRODUODENOSCOPY (EGD);  Surgeon: Jerene Bears, MD;  Location[facility] ENDOSCOPY;  Service: Gastroenterology;  Laterality: N/A;  VAD pt so VAD team will need to accompany pt.   [name] FETAL SURGERY FOR CONGENITAL HERNIA     ????? pt has no knowledge of this.[name]    [name]  INGUINAL HERNIA REPAIR    . INSERTION OF DIALYSIS CATHETER Right 06/26/2017   Procedure: INSERTION OF TUNNELED  DIALYSIS CATHETER RIGHT INTERNAL JUGULAR;  Surgeon: Conrad Marathon, MD;  Location: [place];  Service: Vascular;  Laterality: Right;  . INSERTION OF IMPLANTABLE LEFT VENTRICULAR ASSIST DEVICE N/A 06/12/2017   Procedure: INSERTION OF IMPLANTABLE LEFT VENTRICULAR ASSIST [place] 3;  Surgeon: Ivin Poot, MD;  Location: [place];  Service: Open Heart Surgery;  Laterality: N/A;  HM3 LVAD  CIRC ARREST  NITRIC OXIDE  . INSERTION OF IMPLANTABLE LEFT VENTRICULAR ASSIST DEVICE N/A 02/28/2018   Procedure: REDO INSERTION OF IMPLANTABLE LEFT VENTRICULAR ASSIST DEVICE - HEARTMATE 3;  Surgeon: Ivin Poot, MD;  Location: [place];  Service: Open Heart Surgery;  Laterality: N/A;  . IR REMOVAL TUN CV CATH W/O FL  02/26/2018  . IR THORACENTESIS ASP PLEURAL SPACE W/IMG GUIDE  06/28/2017  . LIGATION OF ARTERIOVENOUS  FISTULA Right 10/31/2017   Procedure: LIGATION OF BRACHIO-BASILIC VEIN TRANSPOSITION RIGHT ARM;  Surgeon: Conrad Frostburg, MD;  Location: [place];  Service: Vascular;  Laterality: Right;  .  MULTIPLE EXTRACTIONS WITH ALVEOLOPLASTY N/A 06/08/2017   Procedure: MULTIPLE EXTRACTION WITH ALVEOLOPLASTY AND PRE PROSTHETIC SURGERY AS NEEDED;  Surgeon: Lenn Cal, DDS;  Location: [place];  Service: Oral Surgery;  Laterality: N/A;  . RIGHT HEART CATH N/A 06/07/2017   Procedure: RIGHT HEART CATH;  Surgeon: Larey Dresser, MD;  Location: [place] CV [facility];  Service: Cardiovascular;  Laterality: N/A;  . RIGHT HEART CATH N/A 02/08/2018   Procedure: RIGHT HEART CATH;  Surgeon: Larey Dresser, MD;  Location: [place] [facility];  Service: Cardiovascular;  Laterality: N/A;  . RIGHT/LEFT HEART CATH AND CORONARY ANGIOGRAPHY N/A 11/23/2016   Procedure: Right/Left Heart Cath and Coronary Angiography;  Surgeon: Larey Dresser, MD;  Location: [place] CV LAB;  Service: Cardiovascular;  Laterality: N/A;  . TEE WITHOUT CARDIOVERSION N/A 06/12/2017   Procedure: TRANSESOPHAGEAL ECHOCARDIOGRAM (TEE);  Surgeon: Prescott Gum, Collier Salina, MD;  Location: [place];  Service: Open Heart Surgery;  Laterality: N/A;  . TEE WITHOUT CARDIOVERSION N/A 02/28/2018   Procedure: TRANSESOPHAGEAL ECHOCARDIOGRAM (TEE);  Surgeon: Prescott Gum, Collier Salina, MD;  Location: [place];  Service: Open Heart Surgery;  Laterality: N/A;  . THROMBECTOMY AND REVISION OF ARTERIOVENTOUS (AV) GORETEX  GRAFT Right 12/12/2017   Procedure: THROMBECTOMY ARTERIOVENTOUS (AV) GORETEX  GRAFT RIGHT UPPER ARM;  Surgeon: Conrad Mena, MD;  Location: [facility] OR;  Service: Vascular;  Laterality: Right;    Family History: Family History  Problem Relation Age of Onset  . Colon polyps Mother   . Diabetes Mother   . Heart attack Father   . Diabetes Father   . Stroke Paternal Grandfather   . Colitis Sister   . Heart disease Brother   . Colitis Sister   . Colon cancer Neg Hx     Social History: Social History   Socioeconomic History  . Marital status: Married    Spouse name: Not on file  . Number of children: Not on file  . Years of education: Not on file  . Highest  education level: Not on file  Occupational History  . Not on file  Social Needs  . Financial resource strain: Not on file  . Food insecurity:    Worry: Not on file    Inability: Not on file  . Transportation needs:    Medical: Not on file    Non-medical: Not on file  Tobacco Use  . Smoking status: Never Smoker  . Smokeless tobacco: Never Used  Substance and Sexual Activity  . Alcohol use: No  . Drug use: No  . Sexual activity: Never  Lifestyle  . Physical activity:    Days per week: Not on file    Minutes per session: Not on file  . Stress: Not on file  Relationships  . Social connections:    Talks on phone: Not on file    Gets together: Not on file    Attends religious service: Not on file    Active member of club or organization: Not on file    Attends meetings of clubs or organizations: Not on file    Relationship status: Not on file  Other Topics Concern  . Not on file  Social History Narrative  . Not on file    Allergies:  Allergies  Allergen Reactions  . Metformin And Related Diarrhea    Objective:    Vital Signs:   Auto Cuff: 170/100 (120) HR 100 O2 sat: 95% on RA No weight yet.   Mean arterial Pressure 120  Physical Exam    General:  Fatigued and chronically ill appearing. SOB with activity.  HEENT: Normal Neck: supple. JVP elevated. Carotids 2+ bilat; no bruits. No lymphadenopathy or thyromegaly appreciated. Cor: Mechanical heart sounds with LVAD hum present. Lungs: Clear Abdomen: soft, nontender, nondistended. No hepatosplenomegaly. No bruits or masses. Good bowel sounds. Driveline: C/D/I; securement device intact and driveline incorporated Extremities: no cyanosis, clubbing, or rash.  Neuro: alert & orientedx3, cranial nerves grossly intact. moves all 4 extremities w/o difficulty. Affect pleasant   Telemetry   NSR/Sinus Tach 90-100s, personally reviewed.   EKG   Pending.   Labs    Basic Metabolic Panel: Recent Labs  Lab  03/17/18 0308 03/18/18 0559 03/19/18 0243  NA 136 135 135  K 3.9 4.5 4.3  CL 97* 99 98  CO2 30 27 25   GLUCOSE 147* 133* 152*  BUN 13 32* 47*  CREATININE 4.37* 6.19* 7.86*  CALCIUM 8.0* 8.2* 8.4*  PHOS  --  2.2*  --     Liver Function Tests: No results for input(s): AST, ALT, ALKPHOS, BILITOT, PROT, ALBUMIN in the last 168 hours. No results for input(s): LIPASE, AMYLASE in the last 168 hours. No results for input(s): AMMONIA in the last 168 hours.  CBC: Recent Labs  Lab 03/17/18 0308 03/18/18 0559 03/19/18 0243  WBC 12.1* 11.7* 12.0*  NEUTROABS 9.2* 8.6* 9.1*  HGB 9.1* 9.3* 8.7*  HCT 28.7* 28.9* 26.9*  MCV 90.8 91.2 89.7  PLT 352 PLATELETS APPEAR ADEQUATE 332   Cardiac Enzymes: No results for input(s): CKTOTAL, CKMB, CKMBINDEX, TROPONINI in the last 168 hours.  BNP: BNP (last 3 results) Recent Labs    03/02/18 0400 03/08/18 0008 03/15/18 0106  BNP 694.1* 1,576.6* 811.3*   ProBNP (last 3 results) No results for input(s): PROBNP in the last 8760 hours.  CBG: Recent Labs  Lab 03/18/18 0749 03/18/18 1224 03/18/18 1632 03/18/18 2106 03/19/18 1033  GLUCAP 136* 185* 264* 178* 133*   Coagulation Studies: Recent Labs    03/22/18 1100  LABPROT 21.9*  INR 1.94   Other results:  Imaging     No results found.  Patient Profile:   [name] is a 52 y.o. male with a history of CAD and ischemic cardiomyopathy now s/p Heartmate 3 LVADon 10/18. Post VAD course complicated byESRD and severe DM gastroparesis. Had emergent VAD exchanged 03/01/18 in  the setting of outflow graft kink.    Assessment/Plan:    1. Hypertensive Urgency - Sent from dialysis center with HTN and resp distress -> unable to dialyze - Amlodipine caused worsening gastroparesis symptoms, so it was stopped.  - Continue Bidil 1.5 tabs TID on non-HD days. He will receive Bidil through [facility] mail order (called in 03/15/18).  - Start Nitro gtt now with goal MAP < 90. May need to  adjust his HD day meds.  - Sildenafil stopped with addition of bidil.   2. VAD: s/p pump exchange 2/2 HM3 graft kink. Has HM3.  - VAD interrogated personally. Parameters relatively stable, though PI elevated in setting of HTN.  - On coumadin and ASA with INR goal 2-3. Dosing per pharmacy. Will need heparin if INR < 1.8 on admission labs.  - LDH has been stable. Pending today.  - Ramp completed 7/9. Speed increased to 5500. 3.Intractable Nausea/Vomiting secondary to gastroparesis:  - Continue reglan, phenergan, and ativan as needed - Continue home Marinol.  - Resumed erythromycin 250 mg TID yesterday. Will continue  - Has beenseen byDr Derrill Kay on 01/24/18 [facility] gastroparesis specialist) - This worsened with addition of amlodipine and improved after stopping.  4. ESRD:  - Will need HD today. Renal aware.  5. DM,insulin dependent - SSI while admitted.  6. Leukocytosis - CBC pending. Had trended down to 12.0 on day of discharge.  7. Anemia:  - CBC pending.  8. Deconditioning - Sent home with HHPT.   I reviewed the LVAD parameters from today, and compared the results to the patient's prior recorded data.  No programming changes were made.  The LVAD is functioning within specified parameters.  The patient performs LVAD self-test daily.  LVAD interrogation was negative for any significant power changes, alarms or PI events/speed drops.  LVAD equipment check completed and is in good working order.  Back-up equipment present.   LVAD education done on emergency procedures and precautions and reviewed exit site care.   Length of Stay: 0  Annamaria Helling 03/23/2018, 2:08 PM  VAD Team Pager [phone] (7am - 7am) +++VAD ISSUES ONLY+++   Advanced Heart Failure Team Pager [phone] (M-F; [place])  Please contact [facility] for night-coverage after hours (4p -7a ) and weekends on [email or web address] for all non- LVAD Issues   Patient seen with PA, agree with the above note.   Patient  was sent from his outpatient HD unit to ER because of hypertension, nausea, and ?respiratory distress.  He has not had respiratory distress here.  MAP is quite high, and he has had some recurrent nausea, though does not seem as bad as yesterday when we saw him in clinic and gave him IV fluid for dehydration/poor po intake.   He had to have a CVL placed for vascular access and had bleeding from the site, requiring stitch from Dr. Prescott Gum.   We are starting him on NTG gtt to control BP for now, will likely transition him over to home Bidil.  I think he is going to need to take Bidil on HD days as well.  For nausea, he will continue his usual meds and we have added back erthyromycin for gastroparesis management.   In the future, we will need to think about placing a long-term port for vascular access.   Per nephrology, he will have HD in the morning.   Jasir Rother 03/23/2018

## 2018-03-23 NOTE — Progress Notes (Signed)
ANTICOAGULATION CONSULT NOTE - Initial Consult  Pharmacy Consult for Warfarin Indication: LVAD  Allergies  Allergen Reactions  . Metformin And Related Diarrhea    Patient Measurements: Height: 5' 8"  (172.7 cm) Weight: 169 lb 12.1 oz (77 kg) IBW/kg (Calculated) : 68.4  Vital Signs: Temp: 98.1 F (36.7 C) (07/19 1446) Temp Source: Oral (07/19 1446)  Labs: Recent Labs    03/22/18 1100 03/23/18 1521  LABPROT 21.9* 29.7*  INR 1.94 2.85  CREATININE  --  6.01*    Estimated Creatinine Clearance: 13.8 mL/min (A) (by C-G formula based on SCr of 6.01 mg/dL (H)).   Medical History: Past Medical History:  Diagnosis Date  . Angina   . ASCVD (arteriosclerotic cardiovascular disease)    , Anterior infarction 2005, LAD diagonal bifurcation intervention 03/2004  . Automatic implantable cardiac defibrillator -St. Jude's       . Benign neoplasm of colon   . CHF (congestive heart failure) ([place])   . Chronic systolic heart failure ([place])   . Coronary artery disease     Widely patent previously placed stents in the left anterior   . Crohn's disease ([place])   . 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 Dialysis patient ([place])   . Dyspnea   . Gastroparesis   . GERD (gastroesophageal reflux disease)   . High cholesterol   . Hyperlipidemia   . Hypersomnolent    Previous diagnosis of narcolepsy  . Hypertension, essential   . Ischemic cardiomyopathy    Ejection fraction 15-20% catheterization 2010  . Type II or unspecified type diabetes mellitus without mention of complication, not stated as uncontrolled   . Unspecified gastritis and gastroduodenitis without mention of hemorrhage     Medications:  Medications Prior to Admission  Medication Sig Dispense Refill Last Dose  . aspirin EC 81 MG tablet Take 1 tablet (81 mg total) by mouth daily. 90 tablet 3 03/22/2018 at Unknown time  . atorvastatin (LIPITOR) 40 MG tablet Take 1 tablet (40 mg total) by mouth daily at 6 PM. 30  tablet 6 03/22/2018 at Unknown time  . docusate sodium (COLACE) 100 MG capsule Take 2 capsules (200 mg total) by mouth daily. (Patient taking differently: Take 100 mg by mouth 2 (two) times daily as needed for mild constipation. ) 10 capsule 0 Past Week at Unknown time  . dronabinol (MARINOL) 2.5 MG capsule Take 1 capsule (2.5 mg total) by mouth 2 (two) times daily before lunch and supper. 60 capsule 5 03/23/2018 at Unknown time  . insulin aspart (NOVOLOG) 100 UNIT/ML injection Inject 5 Units into the skin 3 (three) times daily with meals. 1 vial 0 03/22/2018 at Unknown time  . insulin glargine (LANTUS) 100 UNIT/ML injection Inject 0.1 mLs (10 Units total) into the skin daily.   03/22/2018 at Unknown time  . isosorbide-hydrALAZINE (BIDIL) 20-37.5 MG tablet Take 1.5 tablets by mouth every 8 (eight) hours. Take 1.5 tablets every 8 hours on non dialysis days. (Patient taking differently: Take 1.5 tablets by mouth See admin instructions. Take 1.5 tablets every 8 hours on non dialysis days.) 135 tablet 6 03/22/2018 at Unknown time  . LORazepam (ATIVAN) 0.5 MG tablet Take 1 tablet (0.5 mg total) by mouth every 12 (twelve) hours as needed (nausea). 10 tablet 0 03/22/2018 at Unknown time  . metoCLOPramide (REGLAN) 10 MG tablet Take 1 tablet (10 mg total) by mouth 3 (three) times daily. 90 tablet 6 03/23/2018 at Unknown time  . ondansetron (ZOFRAN ODT) 4 MG  disintegrating tablet Take 1 tablet (4 mg total) by mouth every 6 (six) hours as needed for nausea or vomiting. 40 tablet 1  at Unknown  . pantoprazole (PROTONIX) 40 MG tablet Take 40 mg by mouth 2 (two) times daily.    03/23/2018 at Unknown time  . promethazine (PHENERGAN) 12.5 MG tablet Take 1 tablet (12.5 mg total) by mouth every 6 (six) hours as needed for nausea or vomiting. 30 tablet 0 03/22/2018 at Unknown time  . citalopram (CELEXA) 20 MG tablet Take 1 tablet (20 mg total) by mouth daily. 30 tablet 6   . erythromycin (E-MYCIN) 250 MG tablet Take 1 tablet (250  mg total) by mouth 3 (three) times daily. (Patient not taking: Reported on 03/23/2018) 90 tablet 0 Not Taking at Unknown time    Assessment: 52 yo M on warfarin PTA for LVAD and hx factor V Leiden.  INR therapeutic on home regimen.  Will continue.  Goal of Therapy:  INR 2-3 Monitor platelets by anticoagulation protocol: Yes   Plan:  Coumadin 67m PO x 1 tonight. Daily INR  [employer] Pharm.D., BCPS Clinical Pharmacist Pager: [phone] phone for 03/23/2018 is [phone].  **Pharmacist phone directory can now be found on [email or web address] ([email or web address]).  Listed under [email or web address]  03/23/2018 7:21 PM

## 2018-03-23 NOTE — Consult Note (Signed)
[facility] Renal Consultation Note    Indication for Consultation:  Management of ESRD/hemodialysis, anemia, hypertension/volume, and secondary hyperparathyroidism. PCP:  HPI: [name] is a 52 y.o. male with ESRD, CAD, severe ICM with LVAD , Type 2 DM with severe gastroparesis, HTN, GERD who was admitted for dyspnea.  Pt discharged from [facility] 03/19/18 after prolonged admit d/t LVAD outflow kink requiring entire system to be replaced 03/01/18 to Heartmate 3. Within 2 days of discharge, his gastroparesis flared up and was not able to keep anything down (including meds) x 2 days with severe N/V. He presented to the CHF clinic on 03/21/18 and found to be hypertensive and volume depleted. He was given 1L NS, IV phenergan, and IV hydralazine which improved symptoms. Presented to usual HD today, but found to be very dyspneic, and it was felt that he was unstable for outpatient HD, so he was directed to [facility] where he was admitted to the HF team. Interrogation of his LVAD showed high PI, which RN reports can be a sign of volume depletion. Labs are being collected and CXR pending - no results yet. Currently, he is lying flat on his back after having central line placement. He is on nasal oxygen, has some dyspnea, but not in respiratory distress. Denies CP at this time, + abdominal pain. No fever or chills.  From a renal standpoint, he typically dialyzes at [facility] on MWF schedule. Last HD was 7/17 which he completed in entirety. On that day, his pre-HD weight was 78.9 (1.1L below dry weight) - 0.7L removed, post weight was 78.2kg.   Past Medical History:  Diagnosis Date  . Angina   . ASCVD (arteriosclerotic cardiovascular disease)    , Anterior infarction 2005, LAD diagonal bifurcation intervention 03/2004  . Automatic implantable cardiac defibrillator -St. Jude's       . Benign neoplasm of colon   . CHF (congestive heart failure) ([place])   . Chronic systolic heart failure ([place])   .  Coronary artery disease     Widely patent previously placed stents in the left anterior   . Crohn's disease ([place])   . 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 Dialysis patient ([place])   . Dyspnea   . Gastroparesis   . GERD (gastroesophageal reflux disease)   . High cholesterol   . Hyperlipidemia   . Hypersomnolent    Previous diagnosis of narcolepsy  . Hypertension, essential   . Ischemic cardiomyopathy    Ejection fraction 15-20% catheterization 2010  . Type II or unspecified type diabetes mellitus without mention of complication, not stated as uncontrolled   . Unspecified gastritis and gastroduodenitis without mention of hemorrhage    Past Surgical History:  Procedure Laterality Date  . APPENDECTOMY    . AV FISTULA PLACEMENT Right 06/26/2017   Procedure: ARTERIOVENOUS (AV) FISTULA CREATION VERSUS GRAFT RIGHT  ARM;  Surgeon: Conrad Westby, MD;  Location: [facility] OR;  Service: Vascular;  Laterality: Right;  . AV FISTULA PLACEMENT Right 10/31/2017   Procedure: INSERTION OF ARTERIOVENOUS (AV) ARTEGRAFT,  RIGHT UPPER ARM;  Surgeon: Conrad [place], MD;  Location: [place];  Service: Vascular;  Laterality: Right;  . CARDIAC DEFIBRILLATOR PLACEMENT  2010   St. Jude ICD  . COLECTOMY  ~ 2003   "for Crohn's" ascending colon.    . CORONARY STENT INTERVENTION N/A 11/25/2016   Procedure: Coronary Stent Intervention;  Surgeon: Leonie Man, MD;  Location: [place] CV LAB;  Service: Cardiovascular;  Laterality: N/A;  . EP IMPLANTABLE DEVICE N/A 09/14/2016   Procedure: ICD Generator Changeout;  Surgeon: Deboraha Sprang, MD;  Location: [place] CV LAB;  Service: Cardiovascular;  Laterality: N/A;  . ESOPHAGOGASTRODUODENOSCOPY N/A 07/12/2017   Procedure: ESOPHAGOGASTRODUODENOSCOPY (EGD);  Surgeon: Jerene Bears, MD;  Location: [facility] ENDOSCOPY;  Service: Gastroenterology;  Laterality: N/A;  VAD pt so VAD team will need to accompany pt.   [name] FETAL SURGERY FOR CONGENITAL HERNIA     ????? pt has  no knowledge of this..    . INGUINAL HERNIA REPAIR    . INSERTION OF DIALYSIS CATHETER Right 06/26/2017   Procedure: INSERTION OF TUNNELED  DIALYSIS CATHETER RIGHT INTERNAL JUGULAR;  Surgeon: Conrad [place], MD;  Location: [place];  Service: Vascular;  Laterality: Right;  . INSERTION OF IMPLANTABLE LEFT VENTRICULAR ASSIST DEVICE N/A 06/12/2017   Procedure: INSERTION OF IMPLANTABLE LEFT VENTRICULAR ASSIST [place] 3;  Surgeon: Ivin Poot, MD;  Location: [place];  Service: Open Heart Surgery;  Laterality: N/A;  HM3 LVAD  CIRC ARREST  NITRIC OXIDE  . INSERTION OF IMPLANTABLE LEFT VENTRICULAR ASSIST DEVICE N/A 02/28/2018   Procedure: REDO INSERTION OF IMPLANTABLE LEFT VENTRICULAR ASSIST DEVICE - HEARTMATE 3;  Surgeon: Ivin Poot, MD;  Location: [place];  Service: Open Heart Surgery;  Laterality: N/A;  . IR REMOVAL TUN CV CATH W/O FL  02/26/2018  . IR THORACENTESIS ASP PLEURAL SPACE W/IMG GUIDE  06/28/2017  . LIGATION OF ARTERIOVENOUS  FISTULA Right 10/31/2017   Procedure: LIGATION OF BRACHIO-BASILIC VEIN TRANSPOSITION RIGHT ARM;  Surgeon: Conrad Tim[place]on, MD;  Location: [place];  Service: Vascular;  Laterality: Right;  . MULTIPLE EXTRACTIONS WITH ALVEOLOPLASTY N/A 06/08/2017   Procedure: MULTIPLE EXTRACTION WITH ALVEOLOPLASTY AND PRE PROSTHETIC SURGERY AS NEEDED;  Surgeon: Lenn Cal, DDS;  Location: [place];  Service: Oral Surgery;  Laterality: N/A;  . RIGHT HEART CATH N/A 06/07/2017   Procedure: RIGHT HEART CATH;  Surgeon: Larey Dresser, MD;  Location: [place] CV LAB;  Service: Cardiovascular;  Laterality: N/A;  . RIGHT HEART CATH N/A 02/08/2018   Procedure: RIGHT HEART CATH;  Surgeon: Larey Dresser, MD;  Location: [place] CV LAB;  Service: Cardiovascular;  Laterality: N/A;  . RIGHT/LEFT HEART CATH AND CORONARY ANGIOGRAPHY N/A 11/23/2016   Procedure: Right/Left Heart Cath and Coronary Angiography;  Surgeon: Larey Dresser, MD;  Location: [place] CV LAB;  Service:  Cardiovascular;  Laterality: N/A;  . TEE WITHOUT CARDIOVERSION N/A 06/12/2017   Procedure: TRANSESOPHAGEAL ECHOCARDIOGRAM (TEE);  Surgeon: Prescott Gum, Collier Salina, MD;  Location: [place];  Service: Open Heart Surgery;  Laterality: N/A;  . TEE WITHOUT CARDIOVERSION N/A 02/28/2018   Procedure: TRANSESOPHAGEAL ECHOCARDIOGRAM (TEE);  Surgeon: Prescott Gum, Collier Salina, MD;  Location: [place];  Service: Open Heart Surgery;  Laterality: N/A;  . THROMBECTOMY AND REVISION OF ARTERIOVENTOUS (AV) GORETEX  GRAFT Right 12/12/2017   Procedure: THROMBECTOMY ARTERIOVENTOUS (AV) GORETEX  GRAFT RIGHT UPPER ARM;  Surgeon: Conrad [place], MD;  Location: [facility] OR;  Service: Vascular;  Laterality: Right;   Family History  Problem Relation Age of Onset  . Colon polyps Mother   . Diabetes Mother   . Heart attack Father   . Diabetes Father   . Stroke Paternal Grandfather   . Colitis Sister   . Heart disease Brother   . Colitis Sister   . Colon cancer Neg Hx    Social History:  reports that he has never smoked. He has never  used smokeless tobacco. He reports that he does not drink alcohol or use drugs.  ROS: As per HPI otherwise negative.  Physical Exam: Vitals:  pending. General: Chronically ill appearing man, NAD. On nasal oxygen. Head: Normocephalic, atraumatic, sclera non-icteric, mucus membranes are moist. Neck: Supple without lymphadenopathy/masses. JVD not elevated. Lungs: Clear bilaterally to auscultation without wheezes, rales, or rhonchi.  Heart: Continuous LVAD hum Abdomen: Soft, non-tender, non-distended with normoactive bowel sounds. No rebound/guarding. No obvious abdominal masses. Musculoskeletal:  Strength and tone appear normal for age. Lower extremities: No edema or ischemic changes, no open wounds. Neuro: Alert and oriented X 3. Moves all extremities spontaneously. Psych:  Responds to questions appropriately with a normal affect. Dialysis Access: AVG + bruit  Allergies  Allergen Reactions  . Metformin And  Related Diarrhea   Prior to Admission medications   Medication Sig Start Date End Date Taking? Authorizing Provider  aspirin EC 81 MG tablet Take 1 tablet (81 mg total) by mouth daily. 07/26/17   Larey Dresser, MD  atorvastatin (LIPITOR) 40 MG tablet Take 1 tablet (40 mg total) by mouth daily at 6 PM. 01/25/18   [name], MD  citalopram (CELEXA) 20 MG tablet Take 1 tablet (20 mg total) by mouth daily. 03/19/18   Georgiana Shore, NP  docusate sodium (COLACE) 100 MG capsule Take 2 capsules (200 mg total) by mouth daily. Patient taking differently: Take 100 mg by mouth 2 (two) times daily.  07/03/17   Clegg, Amy D, NP  dronabinol (MARINOL) 2.5 MG capsule Take 1 capsule (2.5 mg total) by mouth 2 (two) times daily before lunch and supper. 01/30/18   Larey Dresser, MD  erythromycin (E-MYCIN) 250 MG tablet Take 1 tablet (250 mg total) by mouth 3 (three) times daily. 03/22/18   Larey Dresser, MD  insulin aspart (NOVOLOG) 100 UNIT/ML injection Inject 5 Units into the skin 3 (three) times daily with meals. 01/22/18   Georgiana Shore, NP  insulin glargine (LANTUS) 100 UNIT/ML injection Inject 0.1 mLs (10 Units total) into the skin daily. 01/22/18   Georgiana Shore, NP  isosorbide-hydrALAZINE (BIDIL) 20-37.5 MG tablet Take 1.5 tablets by mouth every 8 (eight) hours. Take 1.5 tablets every 8 hours on non dialysis days. 03/19/18   Georgiana Shore, NP  LORazepam (ATIVAN) 0.5 MG tablet Take 1 tablet (0.5 mg total) by mouth every 12 (twelve) hours as needed (nausea). 03/19/18   Georgiana Shore, NP  metoCLOPramide (REGLAN) 10 MG tablet Take 1 tablet (10 mg total) by mouth 3 (three) times daily. 01/22/18   Georgiana Shore, NP  ondansetron (ZOFRAN ODT) 4 MG disintegrating tablet Take 1 tablet (4 mg total) by mouth every 6 (six) hours as needed for nausea or vomiting. 01/10/17   Esterwood, Amy S, PA-C  pantoprazole (PROTONIX) 40 MG tablet Take 40 mg by mouth 2 (two) times daily.     [provider]  promethazine (PHENERGAN) 12.5 MG tablet Take 1 tablet (12.5 mg total) by mouth every 6 (six) hours as needed for nausea or vomiting. 09/16/17   Daune Perch, NP   Current Facility-Administered Medications  Medication Dose Route Frequency Provider Last Rate Last Dose  . nitroGLYCERIN 50 mg in dextrose 5 % 250 mL (0.2 mg/mL) infusion  0-200 mcg/min Intravenous Titrated Larey Dresser, MD       Labs: Basic Metabolic Panel: Recent Labs  Lab 03/17/18 0308 03/18/18 0559 03/19/18 0243  NA 136 135 135  K 3.9  4.5 4.3  CL 97* 99 98  CO2 30 27 25   GLUCOSE 147* 133* 152*  BUN 13 32* 47*  CREATININE 4.37* 6.19* 7.86*  CALCIUM 8.0* 8.2* 8.4*  PHOS  --  2.2*  --    CBC: Recent Labs  Lab 03/17/18 0308 03/18/18 0559 03/19/18 0243  WBC 12.1* 11.7* 12.0*  NEUTROABS 9.2* 8.6* 9.1*  HGB 9.1* 9.3* 8.7*  HCT 28.7* 28.9* 26.9*  MCV 90.8 91.2 89.7  PLT 352 PLATELETS APPEAR ADEQUATE 332   CBG: Recent Labs  Lab 03/18/18 0749 03/18/18 1224 03/18/18 1632 03/18/18 2106 03/19/18 1033  GLUCAP 136* 185* 264* 178* 133*   Dialysis Orders: MWF at [facility] 4:15hr, 400/800, 3K/2Ca, EDW 80kg, AVG, no heparin - Venofer 25m IV weekly - Calcitriol 0.531m PO q HD  Assessment/Plan: 1.  Dyspnea: Unclear if volume related. Weight still down, + dyspnea. CXR pending. 2.  Gastroparesis: Recurrent issue. Continue reglan, phenergan, marinol, erythromycin. 3.  ESRD: Usually MWF schedule. Awaiting vitals, labs, CXR to help determine if needs HD today. No edema on exam. Last CXR clear. + dyspnea, but well below EDW and given 1L for presumed hyPOvolemia yesterday (?). Will reassess once labs are back - likely dialyze tomorrow instead. 4.  Hypertension/volume: Starting nitro gtt, has been unable to keep PO meds down. 5.  Anemia: Last Hgb 8.7 - will start Aranesp 10034mweekly. 6.  Metabolic bone disease: Ca/Phos ok on last check. Follow new labs. 7.  T2DM: Per primary. 8.  CAD/severe ICM with Heartmate 3  LVAD: Per CHF team.  KatVeneta PentonA-C 03/23/2018, 3:19 PM  [facility] Pager: [phone]

## 2018-03-24 DIAGNOSIS — I10 Essential (primary) hypertension: Secondary | ICD-10-CM

## 2018-03-24 LAB — GLUCOSE, CAPILLARY
GLUCOSE-CAPILLARY: 98 mg/dL (ref 70–99)
GLUCOSE-CAPILLARY: 63 mg/dL — AB (ref 70–99)
GLUCOSE-CAPILLARY: 158 mg/dL — AB (ref 70–99)
Glucose-Capillary: 100 mg/dL — ABNORMAL HIGH (ref 70–99)
Glucose-Capillary: 103 mg/dL — ABNORMAL HIGH (ref 70–99)
Glucose-Capillary: 231 mg/dL — ABNORMAL HIGH (ref 70–99)
Glucose-Capillary: 49 mg/dL — ABNORMAL LOW (ref 70–99)
Glucose-Capillary: 131 mg/dL — ABNORMAL HIGH (ref 70–99)

## 2018-03-24 LAB — CBC
HEMATOCRIT: 32.5 % — AB (ref 39.0–52.0)
Hemoglobin: 10 g/dL — ABNORMAL LOW (ref 13.0–17.0)
MCH: 28.1 pg (ref 26.0–34.0)
MCHC: 30.8 g/dL (ref 30.0–36.0)
MCV: 91.3 fL (ref 78.0–100.0)
Platelets: 364 10*3/uL (ref 150–400)
RBC: 3.56 MIL/uL — AB (ref 4.22–5.81)
RDW: 15.3 % (ref 11.5–15.5)
WBC: 10.7 10*3/uL — AB (ref 4.0–10.5)

## 2018-03-24 LAB — BASIC METABOLIC PANEL
ANION GAP: 10 (ref 5–15)
BUN: 28 mg/dL — AB (ref 6–20)
CO2: 29 mmol/L (ref 22–32)
Calcium: 8.1 mg/dL — ABNORMAL LOW (ref 8.9–10.3)
Chloride: 101 mmol/L (ref 98–111)
Creatinine, Ser: 6.72 mg/dL — ABNORMAL HIGH (ref 0.61–1.24)
GFR calc Af Amer: 10 mL/min — ABNORMAL LOW (ref >60)
GFR calc non Af Amer: 8 mL/min — ABNORMAL LOW (ref >60)
GLUCOSE: 77 mg/dL (ref 70–99)
POTASSIUM: 3.4 mmol/L — AB (ref 3.5–5.1)
Sodium: 140 mmol/L (ref 135–145)

## 2018-03-24 LAB — PROTIME-INR
INR: 3.54
Prothrombin Time: 35.2 seconds — ABNORMAL HIGH (ref 11.4–15.2)

## 2018-03-24 LAB — LACTATE DEHYDROGENASE: LDH: 178 U/L (ref 98–192)

## 2018-03-24 MED ORDER — DARBEPOETIN ALFA 100 MCG/0.5ML IJ SOSY
PREFILLED_SYRINGE | INTRAMUSCULAR | Status: AC
  Filled 2018-03-24: qty 0.5

## 2018-03-24 NOTE — Progress Notes (Signed)
Patient ID: [name], male   DOB: December 03, 1965, 52 y.o.   MRN: [id number]   Advanced Heart Failure VAD Team Note  PCP-Cardiologist: No primary care provider on file.   Subjective:    Patient was admitted on 7/19 because of nausea/hypertension at outpatient dialysis, they did not feel comfortable doing outpatient dialysis.   He has been restarted on erythromycin and BP was initially controlled with NTG gtt.  Nausea has resolved and he is hungry this morning.  With resolution of nausea, his MAP is back down in the 80s and he is off NTG gtt.  PI is back down to baseline with BP control.   INR 3.5 today.   LVAD INTERROGATION:  HeartMate 3 LVAD:   Flow 4.5 liters/min, speed 5500, power 4.4, PI 3.1.  No alarms.    Objective:    Vital Signs:   Temp:  [98 F (36.7 C)-98.8 F (37.1 C)] 98 F (36.7 C) (07/20 0751) Pulse Rate:  [96-113] 96 (07/20 0751) Resp:  [12-20] 18 (07/20 0751) BP: (95-143)/(77-116) 95/79 (07/20 0803) SpO2:  [98 %-100 %] 100 % (07/20 0751) Weight:  [166 lb 0.1 oz (75.3 kg)-169 lb 12.1 oz (77 kg)] 166 lb 0.1 oz (75.3 kg) (07/20 0501)   Mean arterial Pressure 80s  Intake/Output:   Intake/Output Summary (Last 24 hours) at 03/24/2018 0856 Last data filed at 03/24/2018 0700 Gross per 24 hour  Intake 0 ml  Output -  Net 0 ml     Physical Exam    General:  Well appearing. No resp difficulty HEENT: normal Neck: supple. JVP . Carotids 2+ bilat; no bruits. No lymphadenopathy or thyromegaly appreciated. Cor: Mechanical heart sounds with LVAD hum present. Lungs: clear Abdomen: soft, nontender, nondistended. No hepatosplenomegaly. No bruits or masses. Good bowel sounds. Driveline: C/D/I; securement device intact and driveline incorporated Extremities: no cyanosis, clubbing, rash, edema Neuro: alert & orientedx3, cranial nerves grossly intact. moves all 4 extremities w/o difficulty. Affect pleasant   Telemetry   NSR 90s-100s, personally reviewed.   Labs    Basic Metabolic Panel: Recent Labs  Lab 03/18/18 0559 03/19/18 0243 03/23/18 1521 03/24/18 0509  NA 135 135 139 140  K 4.5 4.3 3.7 3.4*  CL 99 98 100 101  CO2 27 25 23 29   GLUCOSE 133* 152* 105* 77  BUN 32* 47* 25* 28*  CREATININE 6.19* 7.86* 6.01* 6.72*  CALCIUM 8.2* 8.4* 8.6* 8.1*  PHOS 2.2*  --   --   --     Liver Function Tests: Recent Labs  Lab 03/23/18 1521  AST 19  ALT 16  ALKPHOS 115  BILITOT 1.2  PROT 6.7  ALBUMIN 3.0*   No results for input(s): LIPASE, AMYLASE in the last 168 hours. No results for input(s): AMMONIA in the last 168 hours.  CBC: Recent Labs  Lab 03/18/18 0559 03/19/18 0243 03/24/18 0509  WBC 11.7* 12.0* 10.7*  NEUTROABS 8.6* 9.1*  --   HGB 9.3* 8.7* 10.0*  HCT 28.9* 26.9* 32.5*  MCV 91.2 89.7 91.3  PLT PLATELETS APPEAR ADEQUATE 332 364    INR: Recent Labs  Lab 03/18/18 0559 03/19/18 0243 03/22/18 1100 03/23/18 1521 03/24/18 0509  INR 2.29 2.30 1.94 2.85 3.54    Other results:  EKG:    Imaging   Dg Chest Port 1 View  Result Date: 03/23/2018 CLINICAL DATA:  Central line placement. Left ventricular assist device. EXAM: PORTABLE CHEST 1 VIEW COMPARISON:  Chest x-rays 03/14/2018 and chest CT dated 03/13/2018 FINDINGS: Right  central line has been inserted. Tip is in the cavoatrial junction 4 cm below the carina. AICD in place.  Left ventricular assist device in place. Loculated fluid in slight atelectasis at the left base, unchanged. IMPRESSION: Central line in good position at the cavoatrial junction. No other change. Electronically Signed   By: Lorriane Shire M.D.   On: 03/23/2018 16:28      Medications:     Scheduled Medications: . aspirin EC  81 mg Oral Daily  . atorvastatin  40 mg Oral q1800  . Chlorhexidine Gluconate Cloth  6 each Topical Q0600  . citalopram  20 mg Oral Daily  . darbepoetin (ARANESP) injection - DIALYSIS  100 mcg Intravenous Q Sat-HD  . docusate sodium  100 mg Oral BID  . dronabinol  2.5 mg  Oral BID AC  . erythromycin  250 mg Oral TID  . insulin aspart  5 Units Subcutaneous TID WC  . insulin glargine  10 Units Subcutaneous QHS  . isosorbide-hydrALAZINE  1.5 tablet Oral Q8H  . metoCLOPramide  10 mg Oral TID  . pantoprazole  40 mg Oral BID  . Warfarin - Pharmacist Dosing Inpatient   Does not apply q1800     Infusions: . nitroGLYCERIN Stopped (03/23/18 2212)     PRN Medications:  acetaminophen, LORazepam, ondansetron (ZOFRAN) IV, ondansetron, promethazine   Assessment/Plan:    1. Hypertensive Urgency: Sent from dialysis center with nausea and HTN -> thought unable to safely dialyze as outpatient.  Elevated MAP appears to be driven by nausea.  He was initially on NTG gtt, but with resolution of nausea MAP is in the 80s today.  -Continue Bidil 1.5 tabs TID.  I think that he probably needs to take this every day rather than holding on HD days.  2. VAD: Ischemic cardiomyopathy with HM3 LVAD.  S/p pump exchange 2/2 HM3 graft kink.  PI elevated with hypertension initially, now back to baseline with MAP in 80s.  INR 3.5.  LDH 178.  -On coumadin and ASA with INR goal 2-3. Dosing per pharmacy, will adjust today.   3.Intractable Nausea/Vomiting secondary to gastroparesis: Nausea resolved today, wants to eat.  - Continue reglan, phenergan, and ativan as needed - Continue home Marinol.  -Resumederythromycin 250 mg TID yesterday. Will continue this long-term as it seems to help.   - Has beenseen byDr Derrill Kay on 01/24/18 [facility] gastroparesis specialist).  - This worsened with addition of amlodipine and improved after stopping. 4. ESRD:Needs HD today, has been arranged.  5. DM,insulin dependent -SSI while admitted.  6. Anemia:Hemoglobin stable.  7. Deconditioning: Needs to walk today.   Hopefully will get him home tomorrow if MAP and nausea controlled.   I reviewed the LVAD parameters from today, and compared the results to the patient's prior recorded data.  No  programming changes were made.  The LVAD is functioning within specified parameters.  The patient performs LVAD self-test daily.  LVAD interrogation was negative for any significant power changes, alarms or PI events/speed drops.  LVAD equipment check completed and is in good working order.  Back-up equipment present.   LVAD education done on emergency procedures and precautions and reviewed exit site care.  Length of Stay: 1  Loralie Champagne, MD 03/24/2018, 8:56 AM  VAD Team --- VAD ISSUES ONLY--- Pager [phone] (7am - 7am)  Advanced Heart Failure Team  Pager [phone] (M-F; 7a - 4p)  Please contact [facility] for night-coverage after hours (4p -7a ) and weekends on [email or web address]

## 2018-03-24 NOTE — Progress Notes (Signed)
CARDIAC REHAB PHASE I   PRE:  Rate/Rhythm: 103 ST  BP:  Supine: 77/46 (58)  But dopplered at 70  Sitting:   Standing:    SaO2: 100% 2L  MODE:  Ambulation: 260 ft   POST:  Rate/Rhythm: 114 ST  BP:  Supine:   Sitting: 71/47 (56), dopplered at 72  Standing:    SaO2: 99%RA 1115-1203 BP higher when dopplered then with automatic cuff. Pt walked 260 ft on RA with rollator and asst x 1. Knee buckled once and pt sat down to rest. Denied dizziness just tired. Checked sats on RA whole walk and around 96%. Put back on 2L after walk as pt very tired and slightly SOB. Pt needed a little assistance changing from power source to batteries. Left on batteries at pt's request. Back to bed. Anchor was present on drive line prior to walk. Notified RN of discrepancy in MAP readings.    Graylon Good, RN BSN  03/24/2018 11:56 AM

## 2018-03-24 NOTE — Progress Notes (Signed)
PT Cancellation Note  Patient Details Name: [name] MRN: [id number] DOB: Dec 19, 1965   Cancelled Treatment:    Reason Eval/Treat Not Completed: Patient at procedure or test/unavailable. Pt in HD.    [employer] 03/24/2018, 1:38 PM [place]

## 2018-03-24 NOTE — Progress Notes (Signed)
[place] for Warfarin Indication: LVAD  Allergies  Allergen Reactions  . Metformin And Related Diarrhea   Patient Measurements: Height: 5' 8"  (172.7 cm) Weight: 166 lb 0.1 oz (75.3 kg) IBW/kg (Calculated) : 68.4  Vital Signs: Temp: 98 F (36.7 C) (07/20 0751) Temp Source: Oral (07/20 0751) BP: 95/79 (07/20 0803) Pulse Rate: 96 (07/20 0751)  Labs: Recent Labs    03/22/18 1100 03/23/18 1521 03/24/18 0509  HGB  --   --  10.0*  HCT  --   --  32.5*  PLT  --   --  364  LABPROT 21.9* 29.7* 35.2*  INR 1.94 2.85 3.54  CREATININE  --  6.01* 6.72*   Estimated Creatinine Clearance: 12.3 mL/min (A) (by C-G formula based on SCr of 6.72 mg/dL (H)).  Medical History: Past Medical History:  Diagnosis Date  . Angina   . ASCVD (arteriosclerotic cardiovascular disease)    , Anterior infarction 2005, LAD diagonal bifurcation intervention 03/2004  . Automatic implantable cardiac defibrillator -[employer]       . Benign neoplasm of colon   . CHF (congestive heart failure) ([place])   . Chronic systolic heart failure ([place])   . Coronary artery disease     Widely patent previously placed stents in the left anterior   . Crohn's disease ([place])   . 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 Dialysis patient ([place])   . Dyspnea   . Gastroparesis   . GERD (gastroesophageal reflux disease)   . High cholesterol   . Hyperlipidemia   . Hypersomnolent    Previous diagnosis of narcolepsy  . Hypertension, essential   . Ischemic cardiomyopathy    Ejection fraction 15-20% catheterization 2010  . Type II or unspecified type diabetes mellitus without mention of complication, not stated as uncontrolled   . Unspecified gastritis and gastroduodenitis without mention of hemorrhage    Medications:  Medications Prior to Admission  Medication Sig Dispense Refill Last Dose  . aspirin EC 81 MG tablet Take 1 tablet (81 mg total) by mouth daily. 90 tablet  3 03/22/2018 at Unknown time  . atorvastatin (LIPITOR) 40 MG tablet Take 1 tablet (40 mg total) by mouth daily at 6 PM. 30 tablet 6 03/22/2018 at Unknown time  . docusate sodium (COLACE) 100 MG capsule Take 2 capsules (200 mg total) by mouth daily. (Patient taking differently: Take 100 mg by mouth 2 (two) times daily as needed for mild constipation. ) 10 capsule 0 Past Week at Unknown time  . dronabinol (MARINOL) 2.5 MG capsule Take 1 capsule (2.5 mg total) by mouth 2 (two) times daily before lunch and supper. 60 capsule 5 03/23/2018 at Unknown time  . insulin aspart (NOVOLOG) 100 UNIT/ML injection Inject 5 Units into the skin 3 (three) times daily with meals. 1 vial 0 03/22/2018 at Unknown time  . insulin glargine (LANTUS) 100 UNIT/ML injection Inject 0.1 mLs (10 Units total) into the skin daily.   03/22/2018 at Unknown time  . isosorbide-hydrALAZINE (BIDIL) 20-37.5 MG tablet Take 1.5 tablets by mouth every 8 (eight) hours. Take 1.5 tablets every 8 hours on non dialysis days. (Patient taking differently: Take 1.5 tablets by mouth See admin instructions. Take 1.5 tablets every 8 hours on non dialysis days.) 135 tablet 6 03/22/2018 at Unknown time  . LORazepam (ATIVAN) 0.5 MG tablet Take 1 tablet (0.5 mg total) by mouth every 12 (twelve) hours as needed (nausea). 10 tablet 0 03/22/2018 at  Unknown time  . metoCLOPramide (REGLAN) 10 MG tablet Take 1 tablet (10 mg total) by mouth 3 (three) times daily. 90 tablet 6 03/23/2018 at Unknown time  . ondansetron (ZOFRAN ODT) 4 MG disintegrating tablet Take 1 tablet (4 mg total) by mouth every 6 (six) hours as needed for nausea or vomiting. 40 tablet 1  at Unknown  . pantoprazole (PROTONIX) 40 MG tablet Take 40 mg by mouth 2 (two) times daily.    03/23/2018 at Unknown time  . promethazine (PHENERGAN) 12.5 MG tablet Take 1 tablet (12.5 mg total) by mouth every 6 (six) hours as needed for nausea or vomiting. 30 tablet 0 03/22/2018 at Unknown time  . citalopram (CELEXA) 20 MG  tablet Take 1 tablet (20 mg total) by mouth daily. 30 tablet 6   . erythromycin (E-MYCIN) 250 MG tablet Take 1 tablet (250 mg total) by mouth 3 (three) times daily. (Patient not taking: Reported on 03/23/2018) 90 tablet 0 Not Taking at Unknown time   Assessment: 52 yo M on warfarin PTA for LVAD and hx factor V Leiden.  INR therapeutic on home regimen.  Big jump overnight with 89m dose (home regimen) without noted bleeding complications.  He is above desired goal today though, will plan to hold today and resume tomorrow if still not in an up trend.  Goal of Therapy:  INR 2-3 Monitor platelets by anticoagulation protocol: Yes   Plan:  Hold Coumadin today. Daily INR  NRober Minion PharmD., MS Clinical Pharmacist Pager:  [phone] you for allowing pharmacy to be part of this patients care team.  **Pharmacist phone directory can now be found on [facility] ([email or web address]).  Listed under [facility]  03/24/2018 11:15 AM

## 2018-03-24 NOTE — Progress Notes (Signed)
Admit: 03/23/2018 LOS: 1  84M ESRD with LVAD admit with dyspnea and HTN  Subjective:  . weened of NTG gtt . BP improved . Tol PO . Walking in hall   No intake/output data recorded.  Filed Weights   03/23/18 1802 03/24/18 0501  Weight: 77 kg (169 lb 12.1 oz) 75.3 kg (166 lb 0.1 oz)    Scheduled Meds: . aspirin EC  81 mg Oral Daily  . atorvastatin  40 mg Oral q1800  . Chlorhexidine Gluconate Cloth  6 each Topical Q0600  . citalopram  20 mg Oral Daily  . darbepoetin (ARANESP) injection - DIALYSIS  100 mcg Intravenous Q Sat-HD  . docusate sodium  100 mg Oral BID  . dronabinol  2.5 mg Oral BID AC  . erythromycin  250 mg Oral TID  . insulin aspart  5 Units Subcutaneous TID WC  . insulin glargine  10 Units Subcutaneous QHS  . isosorbide-hydrALAZINE  1.5 tablet Oral Q8H  . metoCLOPramide  10 mg Oral TID  . pantoprazole  40 mg Oral BID  . Warfarin - Pharmacist Dosing Inpatient   Does not apply q1800   Continuous Infusions: . nitroGLYCERIN Stopped (03/23/18 2212)   PRN Meds:.acetaminophen, LORazepam, ondansetron (ZOFRAN) IV, ondansetron, promethazine  Current Labs: reviewed    Physical Exam:  Blood pressure (!) 71/47, pulse 99, temperature 97.9 F (36.6 C), temperature source Oral, resp. rate 17, height 5' 8"  (1.727 m), weight 75.3 kg (166 lb 0.1 oz), SpO2 100 %. Awake, alert, standing Cont hum on CV exam CTAB No LEE AVG+Bruit  Dialysis Orders: MWF at [facility] 4:15hr, 400/800, 3K/2Ca, EDW 80kg, AVG, no heparin - Venofer 27m IV weekly - Calcitriol 0.536m PO q HD  A/P 1.  Dyspnea: Resolved, on RA 2.  Gastroparesis: Improved this AM. Recurrent issue.  3. ESRD: For HD today 4.  Hypertension/volume: Improved off NTG gtt, on PO meds 5.  Anemia: Last Hgb 8.7 - will start Aranesp 10080mweekly. 6.  Metabolic bone disease: Ca/Phos ok on last check. Follow new labs. 7.  T2DM: Per primary. 8.  CAD/severe ICM with Heartmate 3 LVAD: Per CHF team.    RyaPearson Grippe 03/24/2018,  12:00 PM  Recent Labs  Lab 03/18/18 0559 03/19/18 0243 03/23/18 1521 03/24/18 0509  NA 135 135 139 140  K 4.5 4.3 3.7 3.4*  CL 99 98 100 101  CO2 27 25 23 29   GLUCOSE 133* 152* 105* 77  BUN 32* 47* 25* 28*  CREATININE 6.19* 7.86* 6.01* 6.72*  CALCIUM 8.2* 8.4* 8.6* 8.1*  PHOS 2.2*  --   --   --    Recent Labs  Lab 03/18/18 0559 03/19/18 0243 03/24/18 0509  WBC 11.7* 12.0* 10.7*  NEUTROABS 8.6* 9.1*  --   HGB 9.3* 8.7* 10.0*  HCT 28.9* 26.9* 32.5*  MCV 91.2 89.7 91.3  PLT PLATELETS APPEAR ADEQUATE 332 364

## 2018-03-25 ENCOUNTER — Other Ambulatory Visit: Payer: Self-pay

## 2018-03-25 ENCOUNTER — Encounter (HOSPITAL_COMMUNITY): Payer: Self-pay

## 2018-03-25 DIAGNOSIS — I160 Hypertensive urgency: Secondary | ICD-10-CM | POA: Diagnosis present

## 2018-03-25 DIAGNOSIS — I5023 Acute on chronic systolic (congestive) heart failure: Secondary | ICD-10-CM

## 2018-03-25 LAB — PROTIME-INR
INR: 2.18
Prothrombin Time: 24.1 seconds — ABNORMAL HIGH (ref 11.4–15.2)

## 2018-03-25 LAB — BASIC METABOLIC PANEL
Anion gap: 7 (ref 5–15)
BUN: 19 mg/dL (ref 6–20)
CHLORIDE: 100 mmol/L (ref 98–111)
CO2: 29 mmol/L (ref 22–32)
Calcium: 7.7 mg/dL — ABNORMAL LOW (ref 8.9–10.3)
Creatinine, Ser: 4.45 mg/dL — ABNORMAL HIGH (ref 0.61–1.24)
GFR calc Af Amer: 16 mL/min — ABNORMAL LOW (ref >60)
GFR, EST NON AFRICAN AMERICAN: 14 mL/min — AB (ref >60)
Glucose, Bld: 188 mg/dL — ABNORMAL HIGH (ref 70–99)
POTASSIUM: 4 mmol/L (ref 3.5–5.1)
Sodium: 136 mmol/L (ref 135–145)

## 2018-03-25 LAB — CBC
HEMATOCRIT: 30.9 % — AB (ref 39.0–52.0)
Hemoglobin: 9.6 g/dL — ABNORMAL LOW (ref 13.0–17.0)
MCH: 28.7 pg (ref 26.0–34.0)
MCHC: 31.1 g/dL (ref 30.0–36.0)
MCV: 92.2 fL (ref 78.0–100.0)
Platelets: 298 10*3/uL (ref 150–400)
RBC: 3.35 MIL/uL — ABNORMAL LOW (ref 4.22–5.81)
RDW: 15.4 % (ref 11.5–15.5)
WBC: 12.6 10*3/uL — ABNORMAL HIGH (ref 4.0–10.5)

## 2018-03-25 LAB — GLUCOSE, CAPILLARY: Glucose-Capillary: 155 mg/dL — ABNORMAL HIGH (ref 70–99)

## 2018-03-25 LAB — LACTATE DEHYDROGENASE: LDH: 169 U/L (ref 98–192)

## 2018-03-25 MED ORDER — WARFARIN SODIUM 5 MG PO TABS: 5.0000 mg | ORAL_TABLET | Freq: Every day | ORAL | Status: DC

## 2018-03-25 MED ORDER — WARFARIN SODIUM 5 MG PO TABS: 5.0000 mg | ORAL_TABLET | Freq: Every day | ORAL | 6 refills | Status: DC

## 2018-03-25 MED ORDER — DOCUSATE SODIUM 100 MG PO CAPS: 100.0000 mg | ORAL_CAPSULE | Freq: Two times a day (BID) | ORAL | Status: DC | PRN

## 2018-03-25 MED ORDER — ERYTHROMYCIN BASE 250 MG PO TABS: 250.0000 mg | ORAL_TABLET | Freq: Three times a day (TID) | ORAL | 4 refills | Status: DC

## 2018-03-25 NOTE — Progress Notes (Signed)
Patient ID: [name], male   DOB: [birth year], 52 y.o.   MRN: [id number]   Advanced Heart Failure VAD Team Note  PCP-Cardiologist: No primary care provider on file.   Subjective:    Patient was admitted on 7/19 because of nausea/hypertension at outpatient dialysis, they did not feel comfortable doing outpatient dialysis.   He has been restarted on erythromycin and BP was initially controlled with NTG gtt.  Nausea has resolved and he ate a good breakfast this morning.  With resolution of nausea, his MAP is back down in the 70s-80s generally and he is off NTG gtt.  PI is back down to baseline with BP control.   INR 2.18 today.   LVAD INTERROGATION:  HeartMate 3 LVAD:   Flow 4.3 liters/min, speed 5500, power 4.4, PI 3.3.  No PI events.    Objective:    Vital Signs:   Temp:  [97.9 F (36.6 C)-99.4 F (37.4 C)] 98.2 F (36.8 C) (07/21 0818) Pulse Rate:  [87-109] 100 (07/21 0339) Resp:  [13-29] 27 (07/21 0339) BP: (66-94)/(39-82) 74/56 (07/21 0820) SpO2:  [98 %-100 %] 100 % (07/21 0818) Weight:  [163 lb 2.3 oz (74 kg)-166 lb 14.2 oz (75.7 kg)] 166 lb 14.2 oz (75.7 kg) (07/21 0548)   Mean arterial Pressure 70s-80s  Intake/Output:   Intake/Output Summary (Last 24 hours) at 03/25/2018 0925 Last data filed at 03/24/2018 1840 Gross per 24 hour  Intake 250 ml  Output 1500 ml  Net -1250 ml     Physical Exam    General: Well appearing this am. NAD.  HEENT: Normal. Neck: Supple, JVP 7-8 cm. Carotids OK.  Cardiac:  Mechanical heart sounds with LVAD hum present.  Lungs:  CTAB, normal effort.  Abdomen:  NT, ND, no HSM. No bruits or masses. +BS  LVAD exit site: Well-healed and incorporated. Dressing dry and intact. No erythema or drainage. Stabilization device present and accurately applied. Driveline dressing changed daily per sterile technique. Extremities:  Warm and dry. No cyanosis, clubbing, rash, or edema.  Neuro:  Alert & oriented x 3. Cranial nerves grossly intact.  Moves all 4 extremities w/o difficulty. Affect pleasant    Telemetry   NSR 90s, personally reviewed.   Labs   Basic Metabolic Panel: Recent Labs  Lab 03/19/18 0243 03/23/18 1521 03/24/18 0509 03/25/18 0536  NA 135 139 140 136  K 4.3 3.7 3.4* 4.0  CL 98 100 101 100  CO2 25 23 29 29   GLUCOSE 152* 105* 77 188*  BUN 47* 25* 28* 19  CREATININE 7.86* 6.01* 6.72* 4.45*  CALCIUM 8.4* 8.6* 8.1* 7.7*    Liver Function Tests: Recent Labs  Lab 03/23/18 1521  AST 19  ALT 16  ALKPHOS 115  BILITOT 1.2  PROT 6.7  ALBUMIN 3.0*   No results for input(s): LIPASE, AMYLASE in the last 168 hours. No results for input(s): AMMONIA in the last 168 hours.  CBC: Recent Labs  Lab 03/19/18 0243 03/24/18 0509 03/25/18 0536  WBC 12.0* 10.7* 12.6*  NEUTROABS 9.1*  --   --   HGB 8.7* 10.0* 9.6*  HCT 26.9* 32.5* 30.9*  MCV 89.7 91.3 92.2  PLT 332 364 298    INR: Recent Labs  Lab 03/19/18 0243 03/22/18 1100 03/23/18 1521 03/24/18 0509 03/25/18 0536  INR 2.30 1.94 2.85 3.54 2.18    Other results:  EKG:    Imaging   Dg Chest Port 1 View  Result Date: 03/23/2018 CLINICAL DATA:  Central line placement.  Left ventricular assist device. EXAM: PORTABLE CHEST 1 VIEW COMPARISON:  Chest x-rays 03/14/2018 and chest CT dated 03/13/2018 FINDINGS: Right central line has been inserted. Tip is in the cavoatrial junction 4 cm below the carina. AICD in place.  Left ventricular assist device in place. Loculated fluid in slight atelectasis at the left base, unchanged. IMPRESSION: Central line in good position at the cavoatrial junction. No other change. Electronically Signed   By: Lorriane Shire M.D.   On: 03/23/2018 16:28     Medications:     Scheduled Medications: . aspirin EC  81 mg Oral Daily  . atorvastatin  40 mg Oral q1800  . Chlorhexidine Gluconate Cloth  6 each Topical Q0600  . citalopram  20 mg Oral Daily  . darbepoetin (ARANESP) injection - DIALYSIS  100 mcg Intravenous Q  Sat-HD  . docusate sodium  100 mg Oral BID  . dronabinol  2.5 mg Oral BID AC  . erythromycin  250 mg Oral TID  . insulin aspart  5 Units Subcutaneous TID WC  . insulin glargine  10 Units Subcutaneous QHS  . isosorbide-hydrALAZINE  1.5 tablet Oral Q8H  . metoCLOPramide  10 mg Oral TID  . pantoprazole  40 mg Oral BID  . Warfarin - Pharmacist Dosing Inpatient   Does not apply q1800    Infusions: . nitroGLYCERIN Stopped (03/23/18 2212)    PRN Medications: acetaminophen, LORazepam, ondansetron (ZOFRAN) IV, ondansetron, promethazine   Assessment/Plan:    1. Hypertensive Urgency: Sent from dialysis center with nausea and HTN -> thought unable to safely dialyze as outpatient.  Elevated MAP appears to be driven by nausea.  He was initially on NTG gtt, but with resolution of nausea MAP is in the 70s-80s today.  -Continue Bidil 1.5 tabs TID on non-HD days.  2. VAD: Ischemic cardiomyopathy with HM3 LVAD.  S/p pump exchange 2/2 HM3 graft kink.  PI elevated with hypertension initially, now back to baseline with MAP in 70s-80s.  INR 2.18.  LDH 169.  -On coumadin and ASA with INR goal 2-3.   3.Intractable Nausea/Vomiting secondary to gastroparesis: Nausea resolved, eating normally.  - Continue reglan, phenergan, and ativan as needed - Continue home Marinol.  -Resumederythromycin 250 mg TID yesterday. Will continue this long-term as it seems to help.   - Has beenseen byDr Derrill Kay on 01/24/18 [facility] gastroparesis specialist).  - This worsened with addition of amlodipine and improved after stopping. 4. ESRD:Due for HD again on Monday.  5. DM,insulin dependent -SSI while admitted.  6. Anemia:Hemoglobin stable.  7. Disposition: I think he can go home today.  Will need followup in LVAD clinic.  The only change to pre-admission meds will be erythromycin 250 mg tid which he will continue until further notice.    I reviewed the LVAD parameters from today, and compared the results to the  patient's prior recorded data.  No programming changes were made.  The LVAD is functioning within specified parameters.  The patient performs LVAD self-test daily.  LVAD interrogation was negative for any significant power changes, alarms or PI events/speed drops.  LVAD equipment check completed and is in good working order.  Back-up equipment present.   LVAD education done on emergency procedures and precautions and reviewed exit site care.  Length of Stay: 2  Loralie Champagne, MD 03/25/2018, 9:25 AM  VAD Team --- VAD ISSUES ONLY--- Pager [phone] (7am - 7am)  Advanced Heart Failure Team  Pager [phone] (M-F; 7a - 4p)  Please contact [facility] for night-coverage  after hours (4p -7a ) and weekends on [email or web address]

## 2018-03-25 NOTE — Progress Notes (Signed)
[place] for Warfarin Indication: LVAD  Allergies  Allergen Reactions  . Metformin And Related Diarrhea   Patient Measurements: Height: 5' 8"  (172.7 cm) Weight: 166 lb 14.2 oz (75.7 kg) IBW/kg (Calculated) : 68.4  Vital Signs: Temp: 98.2 F (36.8 C) (07/21 0818) Temp Source: Oral (07/21 0818) BP: 74/56 (07/21 0820) Pulse Rate: 100 (07/21 0339)  Labs: Recent Labs    03/23/18 1521 03/24/18 0509 03/25/18 0536  HGB  --  10.0* 9.6*  HCT  --  32.5* 30.9*  PLT  --  364 298  LABPROT 29.7* 35.2* 24.1*  INR 2.85 3.54 2.18  CREATININE 6.01* 6.72* 4.45*   Estimated Creatinine Clearance: 18.6 mL/min (A) (by C-G formula based on SCr of 4.45 mg/dL (H)).  Medical History: Past Medical History:  Diagnosis Date  . Angina   . ASCVD (arteriosclerotic cardiovascular disease)    , Anterior infarction 2005, LAD diagonal bifurcation intervention 03/2004  . Automatic implantable cardiac defibrillator -[employer]. Jude's       . Benign neoplasm of colon   . CHF (congestive heart failure) ([place])   . Chronic systolic heart failure ([place])   . Coronary artery disease     Widely patent previously placed stents in the left anterior   . Crohn's disease ([place])   . 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 Dialysis patient ([place])   . Dyspnea   . Gastroparesis   . GERD (gastroesophageal reflux disease)   . High cholesterol   . Hyperlipidemia   . Hypersomnolent    Previous diagnosis of narcolepsy  . Hypertension, essential   . Ischemic cardiomyopathy    Ejection fraction 15-20% catheterization 2010  . Type II or unspecified type diabetes mellitus without mention of complication, not stated as uncontrolled   . Unspecified gastritis and gastroduodenitis without mention of hemorrhage    Medications:  Medications Prior to Admission  Medication Sig Dispense Refill Last Dose  . aspirin EC 81 MG tablet Take 1 tablet (81 mg total) by mouth daily. 90  tablet 3 03/22/2018 at Unknown time  . atorvastatin (LIPITOR) 40 MG tablet Take 1 tablet (40 mg total) by mouth daily at 6 PM. 30 tablet 6 03/22/2018 at Unknown time  . dronabinol (MARINOL) 2.5 MG capsule Take 1 capsule (2.5 mg total) by mouth 2 (two) times daily before lunch and supper. 60 capsule 5 03/23/2018 at Unknown time  . insulin aspart (NOVOLOG) 100 UNIT/ML injection Inject 5 Units into the skin 3 (three) times daily with meals. 1 vial 0 03/22/2018 at Unknown time  . insulin glargine (LANTUS) 100 UNIT/ML injection Inject 0.1 mLs (10 Units total) into the skin daily.   03/22/2018 at Unknown time  . isosorbide-hydrALAZINE (BIDIL) 20-37.5 MG tablet Take 1.5 tablets by mouth every 8 (eight) hours. Take 1.5 tablets every 8 hours on non dialysis days. (Patient taking differently: Take 1.5 tablets by mouth See admin instructions. Take 1.5 tablets every 8 hours on non dialysis days.) 135 tablet 6 03/22/2018 at Unknown time  . LORazepam (ATIVAN) 0.5 MG tablet Take 1 tablet (0.5 mg total) by mouth every 12 (twelve) hours as needed (nausea). 10 tablet 0 03/22/2018 at Unknown time  . metoCLOPramide (REGLAN) 10 MG tablet Take 1 tablet (10 mg total) by mouth 3 (three) times daily. 90 tablet 6 03/23/2018 at Unknown time  . ondansetron (ZOFRAN ODT) 4 MG disintegrating tablet Take 1 tablet (4 mg total) by mouth every 6 (six) hours as needed  for nausea or vomiting. 40 tablet 1  at Unknown  . pantoprazole (PROTONIX) 40 MG tablet Take 40 mg by mouth 2 (two) times daily.    03/23/2018 at Unknown time  . promethazine (PHENERGAN) 12.5 MG tablet Take 1 tablet (12.5 mg total) by mouth every 6 (six) hours as needed for nausea or vomiting. 30 tablet 0 03/22/2018 at Unknown time  . [DISCONTINUED] docusate sodium (COLACE) 100 MG capsule Take 2 capsules (200 mg total) by mouth daily. (Patient taking differently: Take 100 mg by mouth 2 (two) times daily as needed for mild constipation. ) 10 capsule 0 Past Week at Unknown time  .  citalopram (CELEXA) 20 MG tablet Take 1 tablet (20 mg total) by mouth daily. 30 tablet 6   . [DISCONTINUED] erythromycin (E-MYCIN) 250 MG tablet Take 1 tablet (250 mg total) by mouth 3 (three) times daily. (Patient not taking: Reported on 03/23/2018) 90 tablet 0 Not Taking at Unknown time   Assessment: 52 yo M on warfarin PTA for LVAD and hx factor V Leiden.  INR therapeutic on home regimen.  Big INR jump 2.8>3.5 with 56m dose (home regimen) without noted bleeding complications then big fall with no warfarin last night 2.1.  He is feeling better and appetite back to normal. Plan to dc today will continue home dose and planned recheck INR tues   Goal of Therapy:  INR 2-3 Monitor platelets by anticoagulation protocol: Yes   Plan:  Warfarin 572mdaily Check in clinic visit tues 7/23  LiBonnita Nasutiharm.D. CPP, BCPS Clinical Pharmacist [phone]/21/2019 10:41 AM

## 2018-03-25 NOTE — Progress Notes (Signed)
Notified MD about doppler pressure 60. Patient asymptomatic no orders received I will continue to monitor.

## 2018-03-25 NOTE — Progress Notes (Signed)
Removed central line, small discharge from old central line insertion area- color is light brown. Informed patient to monitor that site. He understood well. Patient wanted to go home and didn't want to change drive line old & new dressing and pt's daughter will do it at home. Patient received the discharge instructions and no medication changes, only start to take coumadin 65m today. Patient daughter took his all belongings include batteries and back. [employer]

## 2018-03-25 NOTE — Discharge Instructions (Signed)

## 2018-03-25 NOTE — Discharge Summary (Signed)
Discharge Summary    Patient ID: [name],  MRN: [id number], DOB/AGE: May 10, 1966 52 y.o.  Admit date: 03/23/2018 Discharge date: 03/25/2018  Primary Care Provider: Lovenia Kim Primary Cardiologist: Dr. Aundra Dubin  Discharge Diagnoses    Principal Problem:   Hypertensive urgency Active Problems:   Acute on chronic systolic (congestive) heart failure (HCC)   Gastroparesis due to DM ([place])   Type 2 diabetes mellitus with complication, with long-term current use of insulin (HCC)   ESRD (end stage renal disease) on dialysis ([place])   Left ventricular assist device present ([place])   Anemia of chronic disease   Allergies Allergies  Allergen Reactions  . Metformin And Related Diarrhea    Diagnostic Studies/Procedures    None this admit _____________   History of Present Illness     52 YO Male with hx of CAD and ICM now s/p Heartmate 3 LVAD on 06/2017.  Developed ESRD post VAD and DM gastroparesis, had emergent VAD exchanged 03/01/18 in setting of outflow graft kink.  On 03/21/18 pt with N/V and unable to take meds.  BP was elevated 166/133.  meds given through AV graft with nephrology consult, BP improved and pt had 1 L NS.   On 03/23/18 pt felt well on waking but symptoms of feeling bad increased during the day.  When he went to dialysis his SBPs were 170s and MAPs > 120s and he was in resp. Distress.  They were not comfortable with dialysing pt.  Pt admitted directly to bed.  Had increased SOB.  + N/V  And LVAD Interrogation HM 3   Speed: 5500 Flow: 3.3 PI: 8.8 Power: 4.6. No PI events since 03/2015  Was started on IV NTG.    Hospital Course     Consultants: Renal  Pt on IV NTG drip for BP control with improvement.  Once his nausea improved BP also improved and BP is now stable and off IV NTG.  The N/V due to intractable N/V and now improved.  On reglan, phenergan, and ativqan as PRN.  His erythromycin was also resumed.  Will plan to continue long term  Adding amlodipine  increased the N/V and now stopped he is better.  He was dialyzed on 2/42/35 without complications or events.  He has been see by Dr. Joelyn Oms.    ESRD on HD next time is Monday.   His schedule in MWF. DM-ID stable resume home meds. Chronic anemia stable   Pt will need follow up in LVAD clinic, and only med change is adding back erythromycin.  Per Dr. Aundra Dubin "I reviewed the LVAD parameters from today, and compared the results to the patient's prior recorded data.  No programming changes were made.  The LVAD is functioning within specified parameters.  The patient performs LVAD self-test daily.  LVAD interrogation was negative for any significant power changes, alarms or PI events/speed drops.  LVAD equipment check completed and is in good working order.  Back-up equipment present.   LVAD education done on emergency procedures and precautions and reviewed exit site care."   Pt seen and evaluated by Dr. Aundra Dubin this AM and is stable for discharge.  He will take coumadin 5 mg daily and has follow up for visit and INR on 03/27/18.    _____________  Discharge Vitals Blood pressure (!) 74/56, pulse 100, temperature 98.2 F (36.8 C), temperature source Oral, resp. rate (!) 0, height 5' 8"  (1.727 m), weight 166 lb 14.2 oz (75.7 kg), SpO2 100 %.  Filed Weights   03/24/18 1314 03/24/18 1724 03/25/18 0548  Weight: 166 lb 0.1 oz (75.3 kg) 163 lb 2.3 oz (74 kg) 166 lb 14.2 oz (75.7 kg)    Labs & Radiologic Studies    CBC Recent Labs    03/24/18 0509 03/25/18 0536  WBC 10.7* 12.6*  HGB 10.0* 9.6*  HCT 32.5* 30.9*  MCV 91.3 92.2  PLT 364 025   Basic Metabolic Panel Recent Labs    03/24/18 0509 03/25/18 0536  NA 140 136  K 3.4* 4.0  CL 101 100  CO2 29 29  GLUCOSE 77 188*  BUN 28* 19  CREATININE 6.72* 4.45*  CALCIUM 8.1* 7.7*   Liver Function Tests Recent Labs    03/23/18 1521  AST 19  ALT 16  ALKPHOS 115  BILITOT 1.2  PROT 6.7  ALBUMIN 3.0*   No results for input(s):  LIPASE, AMYLASE in the last 72 hours. Cardiac Enzymes No results for input(s): CKTOTAL, CKMB, CKMBINDEX, TROPONINI in the last 72 hours. BNP Invalid input(s): POCBNP D-Dimer No results for input(s): DDIMER in the last 72 hours. Hemoglobin A1C No results for input(s): HGBA1C in the last 72 hours. Fasting Lipid Panel No results for input(s): CHOL, HDL, LDLCALC, TRIG, CHOLHDL, LDLDIRECT in the last 72 hours. Thyroid Function Tests No results for input(s): TSH, T4TOTAL, T3FREE, THYROIDAB in the last 72 hours.  Invalid input(s): FREET3 _____________  Dg Chest 2 View  Result Date: 03/07/2018 CLINICAL DATA:  Left ventricular assist device EXAM: CHEST - 2 VIEW COMPARISON:  Yesterday FINDINGS: LVAD present pacer. Dual-chamber leads from the left ICD/. Bilateral IJ catheters in stable position. Cardiomegaly with coronary stenting. Low volume chest with mild streaky opacity. No edema, effusion, or pneumothorax. IMPRESSION: Low volume chest with atelectasis. Electronically Signed   By: Monte Fantasia M.D.   On: 03/07/2018 07:26   Ct Chest W Contrast  Result Date: 03/13/2018 CLINICAL DATA:  Fever of unknown origin EXAM: CT CHEST, ABDOMEN, AND PELVIS WITH CONTRAST TECHNIQUE: Multidetector CT imaging of the chest, abdomen and pelvis was performed following the standard protocol during bolus administration of intravenous contrast. CONTRAST:  177m OMNIPAQUE IOHEXOL 300 MG/ML  SOLN COMPARISON:  02/28/2018 FINDINGS: CT CHEST FINDINGS Cardiovascular: Cardiomegaly with left ventricular hypertrophy. Recent redo LVAD, now with well opacified outflow cannula. No acute vascular finding. Mediastinum/Nodes: Loculated gas bubbles in the left anterior chest. No noted mediastinum is or adenopathy. Lungs/Pleura: Atelectasis with trace pleural fluid.  No pneumothorax Musculoskeletal: Unremarkable sternotomy. No acute or aggressive finding CT ABDOMEN PELVIS FINDINGS Hepatobiliary: No focal liver abnormality.No evidence of  biliary obstruction or stone. Pancreas: Unremarkable. Spleen: Unremarkable. Adrenals/Urinary Tract: Negative adrenals. No hydronephrosis or stone. Unremarkable bladder. Stomach/Bowel: No obstruction. No inflammatory changes. Right hemicolectomy. Vascular/Lymphatic: Atherosclerotic calcification without acute finding . No mass or collection. Reproductive:Negative Other: Body wall edema. There is a packed wound in the left abdominal wall without fluid collection. Negative for abscess. Musculoskeletal: No acute abnormalities. IMPRESSION: 1. No specific explanation for fever. 2. Cluster of gas bubbles in the left anterior pleura or mediastinum, expected after recent surgery. 3. Atelectasis and trace effusions. 4. Good opacification of outflow cannula after interval revision. 5. Packed wound in the left abdominal wall without superimposed fluid collection or cellulitis. Electronically Signed   By: JMonte FantasiaM.D.   On: 03/13/2018 02:51   Ct Chest W Contrast  Result Date: 02/28/2018 CLINICAL DATA:  52year old male with concern for LVAD dysfunction. EXAM: CT CHEST WITH CONTRAST TECHNIQUE: Multidetector CT imaging of  the chest was performed during intravenous contrast administration. CONTRAST:  61m OMNIPAQUE IOHEXOL 300 MG/ML  SOLN COMPARISON:  Chest radiograph dated 01/19/2018 and CT dated 06/27/2017 FINDINGS: Evaluation is limited due to streak artifact caused by pacemaker and LVAD hardware. Cardiovascular: There is moderate cardiomegaly. No pericardial effusion. Multi vessel coronary vascular calcification. Left pectoral pacemaker device is noted. There is a LVAD device. The inflow cannula is along the long axis of the left ventricle. The outflow cannula extends to the upper ascending aorta. Evaluation of the inflow cannula is limited due to severe streak artifacts. There is non opacification of the outflow cannula limiting evaluation of lumen. However, underlying occlusion of the tract is not excluded. There  may be a focal area of kinking of the outflow cannula in the anterior chest (sagittal series 6, image 68). Evaluation of this area is dated due to streak artifact. There is retrograde opacification of the distal outflow cannula at the junction of the SVC. Focal area of filling defect in the distal outflow cannula (series 3 images 59-63) may be related to mixing artifact, although a clot is not excluded. There is opacification of the aorta directly from the left ventricle. There is no aortic aneurysm or dissection. The origins of the great vessels of the aortic arch are patent. 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 The central pulmonary arteries are patent as visualized. Evaluation of the pulmonary artery branches is limited due to suboptimal opacification and timing of the contrast as well as streak artifact caused by LVAD. Mediastinum/Nodes: There is no hilar or mediastinal adenopathy. There is a small hiatal hernia. The esophagus and thyroid gland are grossly unremarkable. Lungs/Pleura: There are linear atelectasis/scarring. There is no focal consolidation, pleural effusion, or pneumothorax. The central airways are patent. Upper Abdomen: Probable mild fatty infiltration of the liver. The visualized upper abdomen is otherwise unremarkable. Musculoskeletal: Median sternotomy wires. No acute osseous pathology. IMPRESSION: 1. LVAD device with possible focal kinking of the outflow cannula. There is non opacification of the outflow cannula limiting the evaluation. Underlying thrombus or occlusion of the outflow cannula is not excluded. The aorta is perfused directly via the left ventricle. 2. Nonocclusive clot in the SVC. 3. Other findings as above. The LVAD findings were discussed in person with Dr. BHaroldine Lawsand the SVC clot was reported to Dr. MDayna Barkerby telephone at the time of interpretation on 02/28/2018 at 11:19 pm. Electronically Signed   By: AAnner CreteM.D.   On: 02/28/2018 23:23   Ct Abdomen  Pelvis W Contrast  Result Date: 03/13/2018 CLINICAL DATA:  Fever of unknown origin EXAM: CT CHEST, ABDOMEN, AND PELVIS WITH CONTRAST TECHNIQUE: Multidetector CT imaging of the chest, abdomen and pelvis was performed following the standard protocol during bolus administration of intravenous contrast. CONTRAST:  1040mOMNIPAQUE IOHEXOL 300 MG/ML  SOLN COMPARISON:  02/28/2018 FINDINGS: CT CHEST FINDINGS Cardiovascular: Cardiomegaly with left ventricular hypertrophy. Recent redo LVAD, now with well opacified outflow cannula. No acute vascular finding. Mediastinum/Nodes: Loculated gas bubbles in the left anterior chest. No noted mediastinum is or adenopathy. Lungs/Pleura: Atelectasis with trace pleural fluid.  No pneumothorax Musculoskeletal: Unremarkable sternotomy. No acute or aggressive finding CT ABDOMEN PELVIS FINDINGS Hepatobiliary: No focal liver abnormality.No evidence of biliary obstruction or stone. Pancreas: Unremarkable. Spleen: Unremarkable. Adrenals/Urinary Tract: Negative adrenals. No hydronephrosis or stone. Unremarkable bladder. Stomach/Bowel: No obstruction. No inflammatory changes. Right hemicolectomy. Vascular/Lymphatic: Atherosclerotic calcification without acute finding . No mass or collection. Reproductive:Negative Other: Body wall edema. There is a packed  wound in the left abdominal wall without fluid collection. Negative for abscess. Musculoskeletal: No acute abnormalities. IMPRESSION: 1. No specific explanation for fever. 2. Cluster of gas bubbles in the left anterior pleura or mediastinum, expected after recent surgery. 3. Atelectasis and trace effusions. 4. Good opacification of outflow cannula after interval revision. 5. Packed wound in the left abdominal wall without superimposed fluid collection or cellulitis. Electronically Signed   By: Monte Fantasia M.D.   On: 03/13/2018 02:51   Ir Removal Tun Cv Cath W/o Fl  Result Date: 02/26/2018 INDICATION: 52 year old male with a history of  renal failure. Permanent access is now functional. He has been referred for removal of his catheter EXAM: REMOVAL TUNNELED CENTRAL VENOUS CATHETER MEDICATIONS: None ANESTHESIA/SEDATION: None FLUOROSCOPY TIME:  None COMPLICATIONS: None PROCEDURE: Informed written consent was obtained from the patient after a thorough discussion of the procedural risks, benefits and alternatives. All questions were addressed. Maximal Sterile Barrier Technique was utilized including caps, mask, sterile gowns, sterile gloves, sterile drape, hand hygiene and skin antiseptic. A timeout was performed prior to the initiation of the procedure. The patient's right chest and catheter was prepped and draped in a normal sterile fashion. Heparin was removed from both ports of catheter. 1% lidocaine was used for local anesthesia. Using gentle blunt dissection the cuff of the catheter was exposed and the catheter was removed in it's entirety. Pressure was held till hemostasis was obtained. A sterile dressing was applied. The patient tolerated the procedure well with no immediate complications. IMPRESSION: Status post removal of right IJ tunneled hemodialysis catheter. Signed, Dulcy Fanny. Dellia Nims, RPVI [facility] Electronically Signed   By: Corrie Mckusick D.O.   On: 02/26/2018 11:23   Dg Chest Port 1 View  Result Date: 03/23/2018 CLINICAL DATA:  Central line placement. Left ventricular assist device. EXAM: PORTABLE CHEST 1 VIEW COMPARISON:  Chest x-rays 03/14/2018 and chest CT dated 03/13/2018 FINDINGS: Right central line has been inserted. Tip is in the cavoatrial junction 4 cm below the carina. AICD in place.  Left ventricular assist device in place. Loculated fluid in slight atelectasis at the left base, unchanged. IMPRESSION: Central line in good position at the cavoatrial junction. No other change. Electronically Signed   By: Lorriane Shire M.D.   On: 03/23/2018 16:28   Dg Chest  Port 1 View  Result Date: 03/14/2018 CLINICAL DATA:  Dyspnea, shortness of breath since this afternoon EXAM: PORTABLE CHEST 1 VIEW COMPARISON:  Portable exam 1711 hours compared to 03/10/2018 FINDINGS: LEFT subclavian AICD leads project over RIGHT atrium and RIGHT ventricle. Epicardial pacing wire present. LVAD at cardiac apex. Enlargement of cardiac silhouette post median sternotomy. Mediastinal contours and pulmonary vascularity normal. Atelectasis in the mid to lower LEFT lung stable. No acute infiltrate, pleural effusion or pneumothorax. IMPRESSION: Enlargement of cardiac silhouette with postoperative changes as above. No acute abnormalities. Electronically Signed   By: Lavonia Dana M.D.   On: 03/14/2018 17:33   Dg Chest Port 1 View  Result Date: 03/10/2018 CLINICAL DATA:  Central line placement EXAM: PORTABLE CHEST 1 VIEW COMPARISON:  03/07/2018 chest radiograph. FINDINGS: Stable configuration 2 lead left subclavian ICD, left ventricular assist device and intact sternotomy wires. Left internal jugular central venous catheter terminates at the cavoatrial junction. Stable cardiomediastinal silhouette with mild cardiomegaly. No pneumothorax. Stable mild blunting of the costophrenic angles bilaterally. No overt pulmonary edema. Stable curvilinear opacities in the peripheral left lung. Stable mild bibasilar atelectasis. IMPRESSION: 1. No pneumothorax.  Left internal jugular central venous catheter terminates at the cavoatrial junction. 2. Stable mild cardiomegaly without pulmonary edema. 3. Stable mild blunting of the costophrenic angles, cannot exclude small pleural effusions. 4. Mild scarring versus atelectasis in the peripheral left lung and at the lung bases. Electronically Signed   By: Ilona Sorrel M.D.   On: 03/10/2018 09:21   Dg Chest Port 1 View  Result Date: 03/06/2018 CLINICAL DATA:  Left ventricular assistance device. EXAM: PORTABLE CHEST 1 VIEW COMPARISON:  Radiograph March 05, 2018. FINDINGS: Stable  cardiomegaly. Left ventricular assist device is unchanged in position. Left-sided pacemaker is unchanged in position. Right internal jugular sheath is unchanged. Left internal jugular catheter is unchanged. No pneumothorax is noted. Bony thorax is unremarkable. Minimal bibasilar subsegmental atelectasis is noted. IMPRESSION: Minimal bibasilar subsegmental atelectasis. Left ventricular device is unchanged. Stable other support apparatus. Electronically Signed   By: Marijo Conception, M.D.   On: 03/06/2018 07:20   Dg Chest Port 1 View  Result Date: 03/05/2018 CLINICAL DATA:  Chest pain EXAM: PORTABLE CHEST 1 VIEW COMPARISON:  March 04, 2018 FINDINGS: Cordis tip is in the superior vena cava. Pacemaker leads are attached to the right atrium, right ventricle, and coronary sinus. There is a chest tube on the left, unchanged. Left ventricular assist device present. No pneumothorax. There is patchy bibasilar atelectatic change with small pleural effusions bilaterally. No new opacity evident. Heart prominent but stable. The pulmonary vascularity is normal. No adenopathy. No appreciable bone lesions. IMPRESSION: Tube and catheter positions as described without pneumothorax. Bibasilar atelectatic change with small pleural effusions. No new opacity. Stable cardiac silhouette. Electronically Signed   By: Lowella Grip III M.D.   On: 03/05/2018 07:28   Dg Chest Port 1 View  Result Date: 03/04/2018 CLINICAL DATA:  Patient with 03/03/2018 LVAD. EXAM: PORTABLE CHEST 1 VIEW COMPARISON:  Chest radiograph FINDINGS: Right IJ sheath in place. Interval retraction PA catheter. Dual lead AICD device overlies the left hemithorax. Left IJ central venous catheter tip projects over the central brachiocephalic vein. Interval removal left chest tube. Chest tube inferior left hemithorax. LVAD in place. Stable enlarged cardiac and mediastinal contours. Bibasilar heterogeneous opacities. Elevation right hemidiaphragm. IMPRESSION: Bibasilar  opacities favored to represent atelectasis. Electronically Signed   By: Lovey Newcomer M.D.   On: 03/04/2018 08:33   Dg Chest Port 1 View  Result Date: 03/03/2018 CLINICAL DATA:  Patient with left ventricular assist device. EXAM: PORTABLE CHEST 1 VIEW COMPARISON:  Chest radiograph 03/02/2018. FINDINGS: Multi lead AICD device overlies the left hemithorax, leads are stable in position. PA catheter projects over the main pulmonary artery. Left IJ central venous catheter present. Left chest tube. LVAD device. Interval removal right chest tube. Interval extubation and removal of enteric tube. Cardiomegaly. Low lung volumes. Elevation right hemidiaphragm. Bibasilar heterogeneous opacities. Trace right pleural effusion. No definite pneumothorax. IMPRESSION: Low lung volumes with elevation right hemidiaphragm and bibasilar opacities favored to represent atelectasis. Electronically Signed   By: Lovey Newcomer M.D.   On: 03/03/2018 08:06   Dg Chest Port 1 View  Result Date: 03/02/2018 CLINICAL DATA:  Respiratory failure.  LVAD pump exchange. EXAM: PORTABLE CHEST 1 VIEW COMPARISON:  03/01/2018 FINDINGS: LVAD again noted. Endotracheal tube with tip 2 cm above the carina, RIGHT IJ Swan-Ganz catheter with tip overlying the main pulmonary artery, LEFT IJ central venous catheter with tip overlying the LEFT brachiocephalic vein, bilateral thoracostomy tubes and NG tube entering the stomach with tip off the field of view noted. This  is a low volume film. No pneumothorax. Minimal pulmonary vascular congestion noted. IMPRESSION: Support apparatus described with minimal pulmonary vascular congestion. No pneumothorax. Electronically Signed   By: Margarette Canada M.D.   On: 03/02/2018 09:12   Dg Chest Port 1 View  Result Date: 03/01/2018 CLINICAL DATA:  Central line placement. EXAM: PORTABLE CHEST 1 VIEW COMPARISON:  03/01/2018 FINDINGS: Interval placement of LEFT IJ central line, tip overlying the superior vena cava. Endotracheal tube  is stable, 3.9 centimeters above carina. RIGHT IJ Swan-Ganz catheter tip overlies the level of the pulmonary outflow tract. Bilateral chest tubes are in place. Patient has a LEFT ventricular assist device. Stable appearance of pacemaker leads. There is no pneumothorax. Heart is mildly enlarged. There is minimal LEFT base atelectasis. No pneumothorax. IMPRESSION: Interval placement of LEFT IJ central line.  No pneumothorax. Electronically Signed   By: Nolon Nations M.D.   On: 03/01/2018 18:56   Dg Chest Port 1 View  Result Date: 03/01/2018 CLINICAL DATA:  LVAD in place EXAM: PORTABLE CHEST - 1 VIEW COMPARISON:  01/19/2018 FINDINGS: Interval median sternotomy. Endotracheal tube tip 2.9 cm above carina. Nasogastric tube extends least as far as the stomach, tip not seen. Bilateral chest tubes in place with no pneumothorax. Relatively low lung volumes with some crowding of bronchovascular structures. If left subclavian AICD stable. The tunneled hemodialysis catheter is been removed and a right IJ Swan-Ganz extends to the right pulmonary artery. LVAD device in expected location. No definite pleural effusion. IMPRESSION: 1. Postop change with support hardware as above. 2. No pneumothorax or effusion. Electronically Signed   By: [name] M.D.   On: 03/01/2018 10:21   Disposition   Pt is being discharged home today in good condition.  Follow-up Plans & Appointments    Follow-up Information    Larey Dresser, MD Follow up on 03/27/2018.   Specialty:  Cardiology Why:  at 10 AM for coumadin and office visit.  Contact information: [place] [address] [phone]            Discharge Medications   Allergies as of 03/25/2018      Reactions   Metformin And Related Diarrhea      Medication List    TAKE these medications   aspirin EC 81 MG tablet Take 1 tablet (81 mg total) by mouth daily.   atorvastatin 40 MG tablet Commonly known as:  LIPITOR Take 1 tablet (40 mg  total) by mouth daily at 6 PM.   citalopram 20 MG tablet Commonly known as:  CELEXA Take 1 tablet (20 mg total) by mouth daily.   docusate sodium 100 MG capsule Commonly known as:  COLACE Take 1 capsule (100 mg total) by mouth 2 (two) times daily as needed for mild constipation.   dronabinol 2.5 MG capsule Commonly known as:  MARINOL Take 1 capsule (2.5 mg total) by mouth 2 (two) times daily before lunch and supper.   erythromycin 250 MG tablet Commonly known as:  E-MYCIN Take 1 tablet (250 mg total) by mouth 3 (three) times daily.   insulin aspart 100 UNIT/ML injection Commonly known as:  novoLOG Inject 5 Units into the skin 3 (three) times daily with meals.   insulin glargine 100 UNIT/ML injection Commonly known as:  LANTUS Inject 0.1 mLs (10 Units total) into the skin daily.   isosorbide-hydrALAZINE 20-37.5 MG tablet Commonly known as:  BIDIL Take 1.5 tablets by mouth every 8 (eight) hours. Take 1.5 tablets every 8 hours  on non dialysis days. What changed:    when to take this  additional instructions   LORazepam 0.5 MG tablet Commonly known as:  ATIVAN Take 1 tablet (0.5 mg total) by mouth every 12 (twelve) hours as needed (nausea).   metoCLOPramide 10 MG tablet Commonly known as:  REGLAN Take 1 tablet (10 mg total) by mouth 3 (three) times daily.   ondansetron 4 MG disintegrating tablet Commonly known as:  ZOFRAN ODT Take 1 tablet (4 mg total) by mouth every 6 (six) hours as needed for nausea or vomiting.   pantoprazole 40 MG tablet Commonly known as:  PROTONIX Take 40 mg by mouth 2 (two) times daily.   promethazine 12.5 MG tablet Commonly known as:  PHENERGAN Take 1 tablet (12.5 mg total) by mouth every 6 (six) hours as needed for nausea or vomiting.   warfarin 5 MG tablet Commonly known as:  COUMADIN Take as directed. If you are unsure how to take this medication, talk to your nurse or doctor. Original instructions:  Take 1 tablet (5 mg total) by mouth  daily at 6 PM.        Acute coronary syndrome (MI, NSTEMI, STEMI, etc) this admission?: No.    Outstanding Labs/Studies   Per HF team, + INR  Duration of Discharge Encounter   Greater than 30 minutes including physician time.  Signed, Cecilie Kicks, NP 03/25/2018, 11:34 AM

## 2018-03-25 NOTE — Progress Notes (Signed)
Admit: 03/23/2018 LOS: 2  28M ESRD with LVAD admit with dyspnea and HTN  Subjective:  . HD yesterday, uneventful, 15L UF . BP stable, tol PO this AM, no SOB . Post weight 74kg  07/20 0701 - 07/21 0700 In: 600 [P.O.:600] Out: 1500   Filed Weights   03/24/18 1314 03/24/18 1724 03/25/18 0548  Weight: 75.3 kg (166 lb 0.1 oz) 74 kg (163 lb 2.3 oz) 75.7 kg (166 lb 14.2 oz)    Scheduled Meds: . aspirin EC  81 mg Oral Daily  . atorvastatin  40 mg Oral q1800  . Chlorhexidine Gluconate Cloth  6 each Topical Q0600  . citalopram  20 mg Oral Daily  . darbepoetin (ARANESP) injection - DIALYSIS  100 mcg Intravenous Q Sat-HD  . docusate sodium  100 mg Oral BID  . dronabinol  2.5 mg Oral BID AC  . erythromycin  250 mg Oral TID  . insulin aspart  5 Units Subcutaneous TID WC  . insulin glargine  10 Units Subcutaneous QHS  . isosorbide-hydrALAZINE  1.5 tablet Oral Q8H  . metoCLOPramide  10 mg Oral TID  . pantoprazole  40 mg Oral BID  . Warfarin - Pharmacist Dosing Inpatient   Does not apply q1800   Continuous Infusions: . nitroGLYCERIN Stopped (03/23/18 2212)   PRN Meds:.acetaminophen, LORazepam, ondansetron (ZOFRAN) IV, ondansetron, promethazine  Current Labs: reviewed    Physical Exam:  Blood pressure (!) 74/56, pulse 100, temperature 98.2 F (36.8 C), temperature source Oral, resp. rate (!) 27, height 5' 8"  (1.727 m), weight 75.7 kg (166 lb 14.2 oz), SpO2 100 %. Awake, alert, standing Cont hum on CV exam CTAB No LEE AVG+Bruit  Dialysis Orders: MWF at [facility] 4:15hr, 400/800, 3K/2Ca, EDW 80kg, AVG, no heparin - Venofer 68m IV weekly - Calcitriol 0.547m PO q HD  A/P 1.  Dyspnea: Resolved, on RA 2.  Gastroparesis: Improved this AM. Recurrent issue.  3. ESRD: Back on schedule MWF 4.  Hypertension/volume: Improved off NTG gtt, on PO meds; under outpt EDW, will gently lower at DC 5.  Anemia: Hb stable on ESA. 6.  Metabolic bone disease: Ca/Phos ok on last check. Follow new  labs. 7.  T2DM: Per primary. 8.  CAD/severe ICM with Heartmate 3 LVAD: Per CHF team.    RyPearson GrippeD 03/25/2018, 9:17 AM  Recent Labs  Lab 03/23/18 1521 03/24/18 0509 03/25/18 0536  NA 139 140 136  K 3.7 3.4* 4.0  CL 100 101 100  CO2 23 29 29   GLUCOSE 105* 77 188*  BUN 25* 28* 19  CREATININE 6.01* 6.72* 4.45*  CALCIUM 8.6* 8.1* 7.7*   Recent Labs  Lab 03/19/18 0243 03/24/18 0509 03/25/18 0536  WBC 12.0* 10.7* 12.6*  NEUTROABS 9.1*  --   --   HGB 8.7* 10.0* 9.6*  HCT 26.9* 32.5* 30.9*  MCV 89.7 91.3 92.2  PLT 332 364 298

## 2018-03-26 ENCOUNTER — Ambulatory Visit (HOSPITAL_COMMUNITY): Payer: Self-pay

## 2018-03-27 ENCOUNTER — Ambulatory Visit (HOSPITAL_COMMUNITY): Payer: Self-pay | Admitting: Pharmacist

## 2018-03-27 ENCOUNTER — Other Ambulatory Visit (HOSPITAL_COMMUNITY): Payer: Self-pay | Admitting: *Deleted

## 2018-03-27 ENCOUNTER — Ambulatory Visit (HOSPITAL_COMMUNITY)
Admission: RE | Admit: 2018-03-27 | Discharge: 2018-03-27 | Disposition: A | Discharge status: Home / Self Care | Payer: BLUE CROSS/BLUE SHIELD | Source: Ambulatory Visit | Attending: Cardiology | Admitting: Cardiology

## 2018-03-27 ENCOUNTER — Telehealth (HOSPITAL_COMMUNITY): Payer: Self-pay | Admitting: *Deleted

## 2018-03-27 VITALS — BP 82/0 | Wt 177.4 lb

## 2018-03-27 DIAGNOSIS — I4892 Unspecified atrial flutter: Secondary | ICD-10-CM | POA: Insufficient documentation

## 2018-03-27 DIAGNOSIS — Z955 Presence of coronary angioplasty implant and graft: Secondary | ICD-10-CM | POA: Insufficient documentation

## 2018-03-27 DIAGNOSIS — E785 Hyperlipidemia, unspecified: Secondary | ICD-10-CM | POA: Insufficient documentation

## 2018-03-27 DIAGNOSIS — E1143 Type 2 diabetes mellitus with diabetic autonomic (poly)neuropathy: Secondary | ICD-10-CM | POA: Insufficient documentation

## 2018-03-27 DIAGNOSIS — Z7982 Long term (current) use of aspirin: Secondary | ICD-10-CM | POA: Diagnosis not present

## 2018-03-27 DIAGNOSIS — Z86718 Personal history of other venous thrombosis and embolism: Secondary | ICD-10-CM | POA: Insufficient documentation

## 2018-03-27 DIAGNOSIS — Z7901 Long term (current) use of anticoagulants: Secondary | ICD-10-CM | POA: Insufficient documentation

## 2018-03-27 DIAGNOSIS — E1122 Type 2 diabetes mellitus with diabetic chronic kidney disease: Secondary | ICD-10-CM | POA: Diagnosis not present

## 2018-03-27 DIAGNOSIS — Z794 Long term (current) use of insulin: Secondary | ICD-10-CM | POA: Insufficient documentation

## 2018-03-27 DIAGNOSIS — I252 Old myocardial infarction: Secondary | ICD-10-CM | POA: Diagnosis not present

## 2018-03-27 DIAGNOSIS — I5022 Chronic systolic (congestive) heart failure: Secondary | ICD-10-CM | POA: Diagnosis not present

## 2018-03-27 DIAGNOSIS — K219 Gastro-esophageal reflux disease without esophagitis: Secondary | ICD-10-CM | POA: Insufficient documentation

## 2018-03-27 DIAGNOSIS — I255 Ischemic cardiomyopathy: Secondary | ICD-10-CM | POA: Insufficient documentation

## 2018-03-27 DIAGNOSIS — R110 Nausea: Secondary | ICD-10-CM

## 2018-03-27 DIAGNOSIS — I2510 Atherosclerotic heart disease of native coronary artery without angina pectoris: Secondary | ICD-10-CM | POA: Insufficient documentation

## 2018-03-27 DIAGNOSIS — D6851 Activated protein C resistance: Secondary | ICD-10-CM | POA: Diagnosis not present

## 2018-03-27 DIAGNOSIS — Z79899 Other long term (current) drug therapy: Secondary | ICD-10-CM | POA: Insufficient documentation

## 2018-03-27 DIAGNOSIS — K3184 Gastroparesis: Secondary | ICD-10-CM | POA: Insufficient documentation

## 2018-03-27 DIAGNOSIS — K5090 Crohn's disease, unspecified, without complications: Secondary | ICD-10-CM | POA: Diagnosis not present

## 2018-03-27 DIAGNOSIS — Z9049 Acquired absence of other specified parts of digestive tract: Secondary | ICD-10-CM | POA: Diagnosis not present

## 2018-03-27 DIAGNOSIS — R918 Other nonspecific abnormal finding of lung field: Secondary | ICD-10-CM | POA: Diagnosis not present

## 2018-03-27 DIAGNOSIS — I132 Hypertensive heart and chronic kidney disease with heart failure and with stage 5 chronic kidney disease, or end stage renal disease: Secondary | ICD-10-CM | POA: Diagnosis not present

## 2018-03-27 DIAGNOSIS — Z95811 Presence of heart assist device: Secondary | ICD-10-CM

## 2018-03-27 DIAGNOSIS — T829XXA Unspecified complication of cardiac and vascular prosthetic device, implant and graft, initial encounter: Secondary | ICD-10-CM

## 2018-03-27 DIAGNOSIS — N186 End stage renal disease: Secondary | ICD-10-CM | POA: Insufficient documentation

## 2018-03-27 DIAGNOSIS — Z992 Dependence on renal dialysis: Secondary | ICD-10-CM | POA: Diagnosis not present

## 2018-03-27 DIAGNOSIS — Z8546 Personal history of malignant neoplasm of prostate: Secondary | ICD-10-CM | POA: Diagnosis not present

## 2018-03-27 DIAGNOSIS — Z5181 Encounter for therapeutic drug level monitoring: Secondary | ICD-10-CM

## 2018-03-27 DIAGNOSIS — Z4509 Encounter for adjustment and management of other cardiac device: Secondary | ICD-10-CM | POA: Diagnosis not present

## 2018-03-27 LAB — POCT INR: INR: 2.1 (ref 2–3)

## 2018-03-27 MED ORDER — LORAZEPAM 0.5 MG PO TABS
0.5000 mg | ORAL_TABLET | Freq: Two times a day (BID) | ORAL | 2 refills | Status: DC | PRN
Start: 2018-03-27 — End: 2019-05-17

## 2018-03-27 MED ORDER — DRONABINOL 2.5 MG PO CAPS: 2.5000 mg | ORAL_CAPSULE | Freq: Two times a day (BID) | ORAL | 5 refills | Status: DC

## 2018-03-27 NOTE — Progress Notes (Addendum)
LVAD Coordinator Rounding Note:  Patient came in today for hospital follow up visit. Denies pain. States that his appetite is a little better than it has been, and he is trying to eat more protein.   Vital signs: HR:  71 Doppler Pressure:  82 Auto BP:  116/64  (77) O2 Sat: 99% Wt: 177.4lb  LVAD interrogation reveals:  Speed: 5500 Flow: 4.4 Power: 4.5w PI: 2.8 Alarms: none  Events:  rare PI events Hematocrit: no labs drawn today (previous 30.9) Fixed speed: 5500 Low speed limit: 5200  Drive Line:   Drive line site, and old drive line sites are improved. Gauze dressing dry and intact; anchor intact and accurately applied. Existing VAD dressing removed and site care performed using sterile technique. Drive line exit site cleaned with Chlora prep applicators x 2, allowed to dry, and gauzedressing with silver stripre-applied. Exit site healingand unincorporated, the velour is fully implanted at exit site. Stitch removed today. Small amount of serous drainage with no redness, tenderness, foul odor or rash noted.Drive line anchorsintactx 2. Previous drive ine site dressing and packing removed. Small amount of serosanguinous drainage noted on packing. Using sterile technique, site was cleaned with betadine, and repacked with Iodaform packing. Site covered with sterile gauze. No redness, tenderness, foul odor, or rash noted.    Plan/Recommendations:  Continue to do daily dressing changes of both drive line sites.  Return for follow up in 1 week.   [name]  RN, VAD Coordinator 24/7 VAD Pager: [phone]     Cardiology: Dr. Aundra Dubin  HPI: 52 y.o. with history of CAD s/p anterior MI in 2010 and NSTEMI in 3/18 with DES to pRCA and mLAD and ischemic cardiomyopathy now s/p Heartmate 3 LVAD and ESRD presents for followup of CHF/LVAD.  Patient developed low output heart failure.  He was not deemed to be a good transplant candidate.  He was admitted in 10/18 and Heartmate  3 LVAD was placed.  He had baseline CKD stage 3 likely from diabetic nephropathy and CHF.  He developed peri-op AKI and ended up on dialysis.  No renal recovery to date.  St Jude ICD has been turned back on and ramp echo was done prior to discharge in 10/18, speed increased to 5600 rpm.   He was admitted in 11/18 with nausea/vomiting and abdominal pain.  This appears to have been due to diabetic gastroparesis (extensive GI evaluation). He had atrial flutter this admission that was converted to NSR with amiodarone.  He improved considerably with scheduled Reglan and erythromycin. He was re-admitted later in 11/18 with the same symptoms, suspect diabetic gastroparesis and was again treated with erythromycin.  He was not able to start domperidone due to prolonged QT interval. He had nausea again about a week ago but was able to manage at home with Phenergan suppositories.   Admitted 1/8 -> 09/16/17 for uncontrolled vomiting in setting of diabetic gastroparesis. Improved with phenergan suppositories and given erythromycin TID for 7 days post-discharge.    He was admitted in 2/19 for AV graft placement.  While in the hospital, we turned down his speed.  Since then, he has had considerably fewer PI events on HD days.    He was admitted in 4/19 with nausea/vomiting again, probably gastroparesis flare.   He was admitted in 5/19 with nausea/vomiting, likely gastroparesis flare, treated with erythromycin course which he is finishing.   He has been evaluated at [facility] by Dr. Derrill Kay (gastroparesis specialist).   Gastric emptying study, surprisingly, was  near normal. However, electrogastrogram showed poor gastric accomodation/capacity, "bradygastria."  Frequent small meals were recommended. Also, early am nausea was thought to be possible GERD.    Admitted 6/26 - 03/19/18 for emergent VAD exchange 03/01/18 in the setting of HM3 outflow graft kink. He required CVVHD post op, but was transtioned back to [facility] without  problems. Course complicated by leukocytosis and fever. CT abd/chest negative for infection and blood cultures were negative. Post op pain well controlled withraretramadol. Able to tolerate diet. Evaluated by PT/OT and HH PT recommended for discharge. Ramp echo completed prior to discharge with speed optimization. Resumed81 mg ASA + coumadin with INR goal 2-3.  He was admitted 03/23/18 with nausea/gastroparesis symptoms and marked HTN at outpatient HD.  Erythromycin was restarted and gastroparesis symptoms were controlled with fall in BP and discharge on 03/25/18.   He returns today for followup of LVAD and CHF.  He has done well since discharge.  No further nausea/vomiting, eating normally.  Not very active but denies dyspnea.  MAP 80s today.  No lightheadedness.  No orthopnea/PND.   Reports taking Coumadin as prescribed and adherence to anticoagulation based dietary restrictions. Denies BRBPR or melena. No dark urine or hematuria.   Labs (11/18): hgb 10.4 => 8.7, LDH 213 Labs (08/04/17): hgb 9 Labs (1/19): HgbA1c 6.1 Labs (2/19): hgb 13.2 Labs (3/19): hgb 11.1 Labs (7/19): hgb 9.6  PMH: 1. Chronic systolic CHF: Ischemic cardiomyopathy. St Jude ICD.  - Echo (11/17) with EF 25-30%, moderate diastolic dysfunction, mild MR, mildly dilated RV with moderately decreased systolic function, peak RV-RA gradient 48 mmHg.  - Echo (3/18) with EF 25-30%, wall motion abnormalities, normal RV size and systolic function.  - Admission 3/18 with cardiogenic shock and AKI, required milrinone gtt.  - Echo (8/18) with EF 20-25%, mild LVH, moderate LV dilation, mild MR.  - CPX (8/18) with peak VO2 12.4, VE/VCO2 slope 35, RER 1.05 => severe HR limitation.  - Heartmate 3 LVAD 10/18.  - Pump thrombosis in setting of HM3 outflow graft kink.  Pump exchange 6/19 for HM3.  2. CAD: Anterior MI 2005, had bifurcation stenting LAD/diagonal. Repeat cath 2010 with patent stents.  - NSTEMI 3/18: LHC with 99% ulcerated  lesion proximal RCA, 95% mid LAD => PCI with DES to proximal RCA and mid LAD.  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  - Lower extremity venous dopplers (8/18): chronic DVT - V/Q scan (8/18): Normal, no PE.  4. Crohns disease: History of colectomy.  5. GERD 6. Diabetic gastroparesis: Admission 11/18 with abdominal pain, nausea/vomiting.  - [facility] workup 2019: Gastric emptying study near normal.  Negative breath test (no evidence for small bowel bacterial overgrowth).  Electrogastrogram showed poor gastric accomodation/capacity, "bradygastria."  7. HTN 8. Hyperlipidemia 9. Type II diabetes  10. ESRD: Post-op LVAD surgery.  11. Carotid dopplers (8/18): mild bilateral stenosis.  12. PFTs (8/18): Mild restriction (no IDL on CT chest).  13. Lung nodules: CT chest 8/18 with small bilateral nodules.  14. PAD: ABIs with moderate bilateral disease in 8/18.  15. Atrial flutter: In 11/18 associated with nausea/vomiting from gastroparesis.   Current Outpatient Medications  Medication Sig Dispense Refill  . aspirin EC 81 MG tablet Take 1 tablet (81 mg total) by mouth daily. 90 tablet 3  . atorvastatin (LIPITOR) 40 MG tablet Take 1 tablet (40 mg total) by mouth daily at 6 PM. 30 tablet 6  . citalopram (CELEXA) 20 MG tablet Take 1 tablet (20 mg total) by mouth daily. 30 tablet  6  . docusate sodium (COLACE) 100 MG capsule Take 1 capsule (100 mg total) by mouth 2 (two) times daily as needed for mild constipation.    [name] dronabinol (MARINOL) 2.5 MG capsule Take 1 capsule (2.5 mg total) by mouth 2 (two) times daily before lunch and supper. 60 capsule 5  . erythromycin (E-MYCIN) 250 MG tablet Take 1 tablet (250 mg total) by mouth 3 (three) times daily. 90 tablet 4  . insulin aspart (NOVOLOG) 100 UNIT/ML injection Inject 5 Units into the skin 3 (three) times daily with meals. 1 vial 0  . insulin glargine (LANTUS) 100 UNIT/ML injection Inject 0.1 mLs (10 Units total) into the skin daily.    . isosorbide-hydrALAZINE  (BIDIL) 20-37.5 MG tablet Take 1.5 tablets by mouth every 8 (eight) hours. Take 1.5 tablets every 8 hours on non dialysis days. (Patient taking differently: Take 1.5 tablets by mouth See admin instructions. Take 1.5 tablets every 8 hours on non dialysis days.) 135 tablet 6  . LORazepam (ATIVAN) 0.5 MG tablet Take 1 tablet (0.5 mg total) by mouth every 12 (twelve) hours as needed (nausea). 60 tablet 2  . metoCLOPramide (REGLAN) 10 MG tablet Take 1 tablet (10 mg total) by mouth 3 (three) times daily. 90 tablet 6  . ondansetron (ZOFRAN ODT) 4 MG disintegrating tablet Take 1 tablet (4 mg total) by mouth every 6 (six) hours as needed for nausea or vomiting. 40 tablet 1  . pantoprazole (PROTONIX) 40 MG tablet Take 40 mg by mouth 2 (two) times daily.     . promethazine (PHENERGAN) 12.5 MG tablet Take 1 tablet (12.5 mg total) by mouth every 6 (six) hours as needed for nausea or vomiting. 30 tablet 0  . warfarin (COUMADIN) 5 MG tablet Take 1 tablet (5 mg total) by mouth daily at 6 PM. 45 tablet 6   No current facility-administered medications for this encounter.     Metformin and related   Review of systems complete and found to be negative unless listed in HPI.    BP (!) 82/0 Comment: doppler  Wt 177 lb 6.4 oz (80.5 kg)   BMI 26.97 kg/m  MAP 82  VAD interrogation & Equipment Management: See nurse's note above.   General: Well appearing this am. NAD.  HEENT: Normal. Neck: Supple, JVP 7-8 cm. Carotids OK.  Cardiac:  Mechanical heart sounds with LVAD hum present.  Lungs:  CTAB, normal effort.  Abdomen:  NT, ND, no HSM. No bruits or masses. +BS  LVAD exit site: Well-healed and incorporated. Dressing dry and intact. No erythema or drainage. Stabilization device present and accurately applied. Driveline dressing changed daily per sterile technique. Extremities:  Warm and dry. No cyanosis, clubbing, rash, or edema.  Neuro:  Alert & oriented x 3. Cranial nerves grossly intact. Moves all 4  extremities w/o difficulty. Affect pleasant    ASSESSMENT AND PLAN: 1. Chronic systolic CHF: Ischemic cardiomyopathy.  St Jude ICD.  NSTEMI in March 2018 with DES to LAD and RCA, complicated by cardiogenic shock, low output requiring milrinone.  Echo 8/18 with EF 20-25%, moderate MR.  CPX (8/18) with severe functional impairment due to HF.  He is s/p HM3 LVAD placement. He had pump thrombosis 6/19 due to [facility] outflow graft kinking.  He had pump exchange for another HM3.  LVAD parameters reviewed today and stable.  No complaints. MAP controlled. NYHA II. Volume status stable on exam.   - Continue Bidil 1.5 tab tid on non-HD days.    -  Continue ASA 81.  Continue warfarin INR 2-3.    - He has been referred to [facility] for evaluation for heart/kidney transplant, he will go back to [place] [facility] in 8/19.  2. ESRD: Developed peri-operatively after HM3 placement.  He is tolerating HD, AV fistula in his arm is working currently. 3. CAD: NSTEMI in 3/18. LHC with 99% ulcerated lesion proximal RCA with left to right collaterals, 95% mid LAD stenosis after mid LAD stent. s/p PCI to RCA and LAD on 11/25/16.   - Continue statin and ASA. 4. h/o DVTs: Factor V Leiden heterozygote. - On coumadin. 5. Diabetic gastroparesis: Difficult to manage. He has seen gastroparesis specialist at [facility]. Surprisingly, gastric emptying study was normal.  However, electrogastrogram showed poor gastric accomodation/capacity.  Therefore, small frequent meals recommended.  Also morning nausea thought to be GERD and Protonix was increased.  Nausea seems to drive markedly high BP (resolves with nausea control).  - Continue reglan 5 mg tid/ac and Marinol.  - Continue Protonix bid.  - Make effort to eat small, more frequent meals.  - He has been restarted on po erythromycin which seems to help significantly.  I will continue this for the time being.  6. DM: HgbA1c well-controlled recently.   7. HTN: MAP controlled on current meds today.  HTN  seems to be driven by nausea (see above), so will try to minimize nausea.  8. PAD: Moderate bilateral disease on ABIs in 8/18.  Denies claudication currently, no pedal ulcers.  9. Atrial flutter: Paroxysmal.  He has been in NSR recently.  10. Difficult IV access: Will discuss port placement at [facility].  Has to have a central line for labs/IV access when he comes to hospital.   Loralie Champagne, MD  03/27/2018

## 2018-03-27 NOTE — Telephone Encounter (Signed)
bidil approved case #[id number]  valid 02/25/18-03/27/19

## 2018-03-27 NOTE — Patient Instructions (Signed)
Continue daily dressing changes.  Return for follow up appointment in 1 week.

## 2018-03-27 NOTE — Addendum Note (Signed)
Encounter addended by: Larey Dresser, MD on: 03/27/2018 11:42 PM  Actions taken: Visit diagnoses modified, LOS modified

## 2018-03-28 ENCOUNTER — Ambulatory Visit (HOSPITAL_COMMUNITY): Payer: Self-pay

## 2018-03-30 ENCOUNTER — Telehealth (HOSPITAL_COMMUNITY): Payer: Self-pay | Admitting: Cardiology

## 2018-03-30 ENCOUNTER — Ambulatory Visit (HOSPITAL_COMMUNITY): Payer: Self-pay

## 2018-03-30 NOTE — Telephone Encounter (Signed)
Verbal order given for continuation of  [facility] services with [facility]

## 2018-04-02 ENCOUNTER — Ambulatory Visit (INDEPENDENT_AMBULATORY_CARE_PROVIDER_SITE_OTHER): Payer: BLUE CROSS/BLUE SHIELD | Admitting: *Deleted

## 2018-04-02 ENCOUNTER — Ambulatory Visit (HOSPITAL_COMMUNITY): Payer: Self-pay

## 2018-04-02 DIAGNOSIS — I255 Ischemic cardiomyopathy: Secondary | ICD-10-CM | POA: Diagnosis not present

## 2018-04-02 NOTE — Progress Notes (Signed)
Remote ICD transmission.   

## 2018-04-03 ENCOUNTER — Other Ambulatory Visit (HOSPITAL_COMMUNITY): Payer: Self-pay | Admitting: *Deleted

## 2018-04-03 ENCOUNTER — Ambulatory Visit (HOSPITAL_COMMUNITY): Payer: Self-pay | Admitting: Pharmacist

## 2018-04-03 ENCOUNTER — Encounter: Payer: Self-pay | Admitting: Cardiology

## 2018-04-03 DIAGNOSIS — Z95811 Presence of heart assist device: Secondary | ICD-10-CM

## 2018-04-03 DIAGNOSIS — Z5181 Encounter for therapeutic drug level monitoring: Secondary | ICD-10-CM

## 2018-04-03 LAB — POCT INR: INR: 2.4 (ref 2–3)

## 2018-04-04 ENCOUNTER — Ambulatory Visit (HOSPITAL_COMMUNITY): Payer: Self-pay

## 2018-04-04 ENCOUNTER — Ambulatory Visit (HOSPITAL_COMMUNITY): Payer: Self-pay | Admitting: Pharmacist

## 2018-04-04 ENCOUNTER — Other Ambulatory Visit (HOSPITAL_COMMUNITY): Payer: Self-pay | Admitting: *Deleted

## 2018-04-04 DIAGNOSIS — Z95811 Presence of heart assist device: Secondary | ICD-10-CM

## 2018-04-04 DIAGNOSIS — Z5181 Encounter for therapeutic drug level monitoring: Secondary | ICD-10-CM

## 2018-04-04 DIAGNOSIS — Z7901 Long term (current) use of anticoagulants: Secondary | ICD-10-CM

## 2018-04-04 NOTE — Progress Notes (Signed)
Opened in error

## 2018-04-05 ENCOUNTER — Ambulatory Visit (HOSPITAL_COMMUNITY)
Admission: RE | Admit: 2018-04-05 | Discharge: 2018-04-05 | Disposition: A | Discharge status: Home / Self Care | Payer: BLUE CROSS/BLUE SHIELD | Source: Ambulatory Visit | Attending: Internal Medicine | Admitting: Internal Medicine

## 2018-04-05 VITALS — BP 95/67 | HR 96 | Wt 173.8 lb

## 2018-04-05 DIAGNOSIS — Z992 Dependence on renal dialysis: Secondary | ICD-10-CM | POA: Insufficient documentation

## 2018-04-05 DIAGNOSIS — Z7901 Long term (current) use of anticoagulants: Secondary | ICD-10-CM | POA: Diagnosis not present

## 2018-04-05 DIAGNOSIS — Z9049 Acquired absence of other specified parts of digestive tract: Secondary | ICD-10-CM | POA: Insufficient documentation

## 2018-04-05 DIAGNOSIS — E785 Hyperlipidemia, unspecified: Secondary | ICD-10-CM | POA: Diagnosis not present

## 2018-04-05 DIAGNOSIS — Z95811 Presence of heart assist device: Secondary | ICD-10-CM | POA: Diagnosis not present

## 2018-04-05 DIAGNOSIS — D6851 Activated protein C resistance: Secondary | ICD-10-CM | POA: Insufficient documentation

## 2018-04-05 DIAGNOSIS — K5090 Crohn's disease, unspecified, without complications: Secondary | ICD-10-CM | POA: Diagnosis not present

## 2018-04-05 DIAGNOSIS — I2510 Atherosclerotic heart disease of native coronary artery without angina pectoris: Secondary | ICD-10-CM | POA: Insufficient documentation

## 2018-04-05 DIAGNOSIS — I252 Old myocardial infarction: Secondary | ICD-10-CM | POA: Insufficient documentation

## 2018-04-05 DIAGNOSIS — I5022 Chronic systolic (congestive) heart failure: Secondary | ICD-10-CM

## 2018-04-05 DIAGNOSIS — Z794 Long term (current) use of insulin: Secondary | ICD-10-CM | POA: Insufficient documentation

## 2018-04-05 DIAGNOSIS — E1122 Type 2 diabetes mellitus with diabetic chronic kidney disease: Secondary | ICD-10-CM | POA: Diagnosis not present

## 2018-04-05 DIAGNOSIS — I4892 Unspecified atrial flutter: Secondary | ICD-10-CM | POA: Insufficient documentation

## 2018-04-05 DIAGNOSIS — T829XXA Unspecified complication of cardiac and vascular prosthetic device, implant and graft, initial encounter: Secondary | ICD-10-CM

## 2018-04-05 DIAGNOSIS — E1143 Type 2 diabetes mellitus with diabetic autonomic (poly)neuropathy: Secondary | ICD-10-CM | POA: Insufficient documentation

## 2018-04-05 DIAGNOSIS — Z7982 Long term (current) use of aspirin: Secondary | ICD-10-CM | POA: Diagnosis not present

## 2018-04-05 DIAGNOSIS — N186 End stage renal disease: Secondary | ICD-10-CM | POA: Insufficient documentation

## 2018-04-05 DIAGNOSIS — Z79899 Other long term (current) drug therapy: Secondary | ICD-10-CM | POA: Diagnosis not present

## 2018-04-05 DIAGNOSIS — Z86718 Personal history of other venous thrombosis and embolism: Secondary | ICD-10-CM | POA: Diagnosis not present

## 2018-04-05 DIAGNOSIS — I255 Ischemic cardiomyopathy: Secondary | ICD-10-CM | POA: Diagnosis not present

## 2018-04-05 DIAGNOSIS — I10 Essential (primary) hypertension: Secondary | ICD-10-CM

## 2018-04-05 DIAGNOSIS — K3184 Gastroparesis: Secondary | ICD-10-CM | POA: Diagnosis not present

## 2018-04-05 DIAGNOSIS — I132 Hypertensive heart and chronic kidney disease with heart failure and with stage 5 chronic kidney disease, or end stage renal disease: Secondary | ICD-10-CM | POA: Insufficient documentation

## 2018-04-05 DIAGNOSIS — Z955 Presence of coronary angioplasty implant and graft: Secondary | ICD-10-CM | POA: Insufficient documentation

## 2018-04-05 DIAGNOSIS — K219 Gastro-esophageal reflux disease without esophagitis: Secondary | ICD-10-CM | POA: Diagnosis not present

## 2018-04-05 NOTE — Progress Notes (Signed)
V[facility] Coordinator [facility]. Reports no problems with VAD equipment or concerns with drive line.   None to rare PI event noted on VAD history except he did have 25 PI events on 04/03/18 in the morning hours. He reports this was the time he was working with home PT, denies dizziness during this time. He did not drink anything prior to exercise; asked him to drink fluid prior to future PT work outs. Pt verbalized understanding of same.   He is using walker today and ambulating without difficulty. No c/o SOB or fatigue, says he is feeling good,   Denies any N&V; remains on erythromycin 250 mg tid since last hospitalization 03/23/18. He also is taking his marinol and reglan as ordered. He denies holding any meds, including warfarin.   He reports he is handling dialysis "better". His dry weight has been adjusted from 76 to 77 kg per Dr. Posey Pronto. H reports removal of 3 liters each session.  Will not draw labs today since pt is a difficult stick. Checked with IR; to have porta-cath placed for IV access, pt's INR must be 1.5 or less. Per Dr. Aundra Dubin, will hold off for now. Will ask dialysis to add monthly LDH. Pt is checking home INR's.  Vital Signs:  Doppler Pressure 173.8 Automatc BP:  95/67 (86) HR:  84 SPO2: UTO %  Weight:  173.8 lb w/o eqt Last weight: 172.4 lb   VAD Indication: Destination therapy- Pt is scheulued for [facility] eval [facility]   VAD interrogation & Equipment Management: Speed:5500 Flow: 4.5 Power:4.5 w PI: 3.3  Alarms: none Events: rare except 25 PI events on 03/29/18  Fixed speed 5500 Low speed limit: 5200  Primary Controller: Replace back up battery in 29 months. Back up controller: Replace back up battery in 72month.  Annual Equipment Maintenance on UBC/PM was performed on 06/2017.  I reviewed the LVAD parameters from todayand compared the results to the patient's prior recorded data.LVAD interrogation  was NEGATIVEfor significant power changes, NEGATIVEfor clinicalalarms and STABLEfor PI events/speed drops. No programming changes were madeand pump is functioning within specified parameters. Pt is performing daily controller and system monitor self tests along with completing weekly and monthly maintenance for LVAD equipment.  LVAD equipment check completed and is in good working order. Back-up equipment present. Charged back up battery and performed self-test on equipment.   Exit Site Care: Drive line is being maintained daily by his daughter. Drive line site, and old drive line sites are improved.Gauze dressing dry and intact; anchor intact and accurately applied. Existing VAD dressing removed and site care performed using sterile technique. Drive line exit site cleaned with Chlora prep applicators x 2, allowed to dry, and gauzedressing with silver stripre-applied. Exit site healingand unincorporated, the velour is fully implanted at exit site.Smallamount of serousdrainage with no redness, tenderness, foul odor or rash noted.Drive line anchorsreplacedx 2.   Previous drive ine site dressing and packing removed. Small amount of serosanguinous drainage noted on packing. Using sterile technique, site was cleaned with betadine, and repacked with Iodaform packing. Site much improved; able to pack only 2 inches of 0.5 iodoform in site. Site covered with sterile gauze. No redness, tenderness, foul odor, or rash noted. Will advance to every other day. If drainage noted on either site, instructed to change daily in order to keep dry and intact. Pt verbalized understanding of same.    Significant Events on VAD Support: -10/2016>> poss drive line infection, CT ABD  neg, ID consult-doxy -11/09/16>> admit for poss drive line infection, IV abx -03/24/17>> doxy for poss drive line infection -05/04/17>> drive line debridement with wound-vac -06/2017>> drive line +proteus, IV abx, Bactrim x14  days  Device:St jude Dual Therapies: on 200 bpm Last check: 01/01/18 - 2 hrs Afl/Afib; EMI 04/02/18  BP &Labs:  MAP 84 - Doppler is reflecting MAP  Hgb not done today; difficult venous access - No S/S of bleeding. Specifically denies melena/BRBPR or nosebleeds.  LDH not done today, difficult venous access - established baseline of 245- 350. Denies tea-colored urine. No power elevations noted on interrogation.    Patient Instructions:  1. No change in meds. 2. PharmD will contact you with INR and coumadin dosing 3. Return to [place] clinic in 2 weeks.    Zada Girt RN VAD Coordinator   Office: [phone] 24/7 Emergency VAD Pager: [phone]  Cardiology: Dr. Aundra Dubin  HPI: 52 y.o. with history of CAD s/p anterior MI in 2010 and NSTEMI in 3/18 with DES to pRCA and mLAD and ischemic cardiomyopathy now s/p Heartmate 3 LVAD and ESRD presents for followup of CHF/LVAD.  Patient developed low output heart failure.  He was not deemed to be a good transplant candidate.  He was admitted in 10/18 and Heartmate 3 LVAD was placed.  He had baseline CKD stage 3 likely from diabetic nephropathy and CHF.  He developed peri-op AKI and ended up on dialysis.  No renal recovery to date.  St Jude ICD has been turned back on and ramp echo was done prior to discharge in 10/18, speed increased to 5600 rpm.   He was admitted in 11/18 with nausea/vomiting and abdominal pain.  This appears to have been due to diabetic gastroparesis (extensive GI evaluation). He had atrial flutter this admission that was converted to NSR with amiodarone.  He improved considerably with scheduled Reglan and erythromycin. He was re-admitted later in 11/18 with the same symptoms, suspect diabetic gastroparesis and was again treated with erythromycin.  He was not able to start domperidone due to prolonged QT interval. He had nausea again about a week ago but was able to manage at home with Phenergan suppositories.   Admitted 1/8 ->  09/16/17 for uncontrolled vomiting in setting of diabetic gastroparesis. Improved with phenergan suppositories and given erythromycin TID for 7 days post-discharge.    He was admitted in 2/19 for AV graft placement.  While in the hospital, we turned down his speed.  Since then, he has had considerably fewer PI events on HD days.    He was admitted in 4/19 with nausea/vomiting again, probably gastroparesis flare.   He was admitted in 5/19 with nausea/vomiting, likely gastroparesis flare, treated with erythromycin course which he is finishing.   He has been evaluated at [facility] by Dr. Derrill Kay (gastroparesis specialist).   Gastric emptying study, surprisingly, was near normal. However, electrogastrogram showed poor gastric accomodation/capacity, "bradygastria."  Frequent small meals were recommended. Also, early am nausea was thought to be possible GERD.    Admitted 6/26 - 03/19/18 for emergent VAD exchange 03/01/18 in the setting of HM3 outflow graft kink. He required CVVHD post op, but was transtioned back to [facility] without problems. Course complicated by leukocytosis and fever. CT abd/chest negative for infection and blood cultures were negative. Post op pain well controlled withraretramadol. Able to tolerate diet. Evaluated by PT/OT and HH PT recommended for discharge. Ramp echo completed prior to discharge with speed optimization. Resumed81 mg ASA + coumadin with INR goal 2-3.  He was admitted 03/23/18 with nausea/gastroparesis symptoms and marked HTN at outpatient HD.  Erythromycin was restarted and gastroparesis symptoms were controlled with fall in BP and discharge on 03/25/18.   Today he returns for 1 week follow up. Overall feeling fine. Denies SOB/PND/Orthopnea. Denies nausea.  Appetite ok. No fever or chills. Denies BRBPR. Tolerating dialysis. Nephrology trying to get dry weight 77 kg. Taking all medications. [facility] following for HHPT. Ambulating with rolling walker.   Labs (11/18): hgb 10.4 =>  8.7, LDH 213 Labs (08/04/17): hgb 9 Labs (1/19): HgbA1c 6.1 Labs (2/19): hgb 13.2 Labs (3/19): hgb 11.1 Labs (7/19): hgb 9.6 Labs 03/25/2018): Creatinine 4.45 Hgb 9.6 WBC 12.6  INR 2.2  PMH: 1. Chronic systolic CHF: Ischemic cardiomyopathy. St Jude ICD.  - Echo (11/17) with EF 25-30%, moderate diastolic dysfunction, mild MR, mildly dilated RV with moderately decreased systolic function, peak RV-RA gradient 48 mmHg.  - Echo (3/18) with EF 25-30%, wall motion abnormalities, normal RV size and systolic function.  - Admission 3/18 with cardiogenic shock and AKI, required milrinone gtt.  - Echo (8/18) with EF 20-25%, mild LVH, moderate LV dilation, mild MR.  - CPX (8/18) with peak VO2 12.4, VE/VCO2 slope 35, RER 1.05 => severe HR limitation.  - Heartmate 3 LVAD 10/18.  - Pump thrombosis in setting of HM3 outflow graft kink.  Pump exchange 6/19 for HM3.  2. CAD: Anterior MI 2005, had bifurcation stenting LAD/diagonal. Repeat cath 2010 with patent stents.  - NSTEMI 3/18: LHC with 99% ulcerated lesion proximal RCA, 95% mid LAD => PCI with DES to proximal RCA and mid LAD.  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  - Lower extremity venous dopplers (8/18): chronic DVT - V/Q scan (8/18): Normal, no PE.  4. Crohns disease: History of colectomy.  5. GERD 6. Diabetic gastroparesis: Admission 11/18 with abdominal pain, nausea/vomiting.  - [facility] workup 2019: Gastric emptying study near normal.  Negative breath test (no evidence for small bowel bacterial overgrowth).  Electrogastrogram showed poor gastric accomodation/capacity, "bradygastria."  7. HTN 8. Hyperlipidemia 9. Type II diabetes  10. ESRD: Post-op LVAD surgery.  11. Carotid dopplers (8/18): mild bilateral stenosis.  12. PFTs (8/18): Mild restriction (no IDL on CT chest).  13. Lung nodules: CT chest 8/18 with small bilateral nodules.  14. PAD: ABIs with moderate bilateral disease in 8/18.  15. Atrial flutter: In 11/18 associated with nausea/vomiting  from gastroparesis.   Current Outpatient Medications  Medication Sig Dispense Refill  . aspirin EC 81 MG tablet Take 1 tablet (81 mg total) by mouth daily. 90 tablet 3  . atorvastatin (LIPITOR) 40 MG tablet Take 1 tablet (40 mg total) by mouth daily at 6 PM. 30 tablet 6  . citalopram (CELEXA) 20 MG tablet Take 1 tablet (20 mg total) by mouth daily. 30 tablet 6  . docusate sodium (COLACE) 100 MG capsule Take 1 capsule (100 mg total) by mouth 2 (two) times daily as needed for mild constipation.    [name] dronabinol (MARINOL) 2.5 MG capsule Take 1 capsule (2.5 mg total) by mouth 2 (two) times daily before lunch and supper. 60 capsule 5  . erythromycin (E-MYCIN) 250 MG tablet Take 1 tablet (250 mg total) by mouth 3 (three) times daily. 90 tablet 4  . insulin aspart (NOVOLOG) 100 UNIT/ML injection Inject 5 Units into the skin 3 (three) times daily with meals. 1 vial 0  . insulin glargine (LANTUS) 100 UNIT/ML injection Inject 0.1 mLs (10 Units total) into the skin daily.    [name]  isosorbide-hydrALAZINE (BIDIL) 20-37.5 MG tablet Take 1.5 tablets by mouth every 8 (eight) hours. Take 1.5 tablets every 8 hours on non dialysis days. (Patient taking differently: Take 1.5 tablets by mouth See admin instructions. Take 1.5 tablets every 8 hours on non dialysis days.) 135 tablet 6  . LORazepam (ATIVAN) 0.5 MG tablet Take 1 tablet (0.5 mg total) by mouth every 12 (twelve) hours as needed (nausea). 60 tablet 2  . metoCLOPramide (REGLAN) 10 MG tablet Take 1 tablet (10 mg total) by mouth 3 (three) times daily. 90 tablet 6  . pantoprazole (PROTONIX) 40 MG tablet Take 40 mg by mouth 2 (two) times daily.     [name] warfarin (COUMADIN) 5 MG tablet Take 1 tablet (5 mg total) by mouth daily at 6 PM. 45 tablet 6  . ondansetron (ZOFRAN ODT) 4 MG disintegrating tablet Take 1 tablet (4 mg total) by mouth every 6 (six) hours as needed for nausea or vomiting. (Patient not taking: Reported on 04/05/2018) 40 tablet 1  . promethazine (PHENERGAN)  12.5 MG tablet Take 1 tablet (12.5 mg total) by mouth every 6 (six) hours as needed for nausea or vomiting. (Patient not taking: Reported on 04/05/2018) 30 tablet 0   No current facility-administered medications for this encounter.     Metformin and related   Review of systems complete and found to be negative unless listed in HPI.    BP 95/67   Pulse 96   Wt 173 lb 12.8 oz (78.8 kg)   BMI 26.43 kg/m  MAP 84  VAD interrogation & Equipment Management: Flow 4.5  Speed 5500  Power 4.5   Physical Exam: GENERAL:No acute distress.Ambulated in the clinic with a rolling walker  HEENT: normal  NECK: Supple, JVP 6-7  .  Carotid 2+ bilaterally, no bruits.  No lymphadenopathy or thyromegaly appreciated.   CARDIAC:  Mechanical heart sounds with LVAD hum present.  LUNGS:  Clear to auscultation bilaterally.  ABDOMEN:  Soft, round, nontender, positive bowel sounds x4.     LVAD exit site: well-healed and incorporated.  Dressing dry and intact.  No erythema or drainage.  Stabilization device present and accurately applied.  Driveline dressing is being changed daily per sterile technique. EXTREMITIES:  Warm and dry, no cyanosis, clubbing, rash or edema. RUE AVF  NEUROLOGIC:  Alert and oriented x 4.  No aphasia.  No dysarthria.  Affect pleasant.       ASSESSMENT AND PLAN: 1. Chronic systolic CHF: Ischemic cardiomyopathy.  St Jude ICD.  NSTEMI in March 2018 with DES to LAD and RCA, complicated by cardiogenic shock, low output requiring milrinone.  Echo 8/18 with EF 20-25%, moderate MR.  CPX (8/18) with severe functional impairment due to HF.  He is s/p HM3 LVAD placement. He had pump thrombosis 6/19 due to [facility] outflow graft kinking.  He had pump exchange for another HM3.  LVAD parameters reviewed today and stable.  NYHA II. Volume managed per HD.  - Continue Bidil 1.5 tab tid on non-HD days.    - Continue ASA 81.  Continue warfarin INR 2-3.    - He has been referred to [facility] for evaluation for  heart/kidney transplant, he will go back to [place] in 8/19.  2. ESRD: Developed peri-operatively after HM3 placement.   -Tolerating HD.  -, AV fistula in his arm is working currently. 3. CAD: NSTEMI in 3/18. LHC with 99% ulcerated lesion proximal RCA with left to right collaterals, 95% mid LAD stenosis after mid LAD stent. s/p PCI  to RCA and LAD on 11/25/16.   - Continue statin and ASA. 4. h/o DVTs: Factor V Leiden heterozygote. - On coumadin. 5. Diabetic gastroparesis: Difficult to manage. He has seen gastroparesis specialist at [facility]. Surprisingly, gastric emptying study was normal.  However, electrogastrogram showed poor gastric accomodation/capacity.  Therefore, small frequent meals recommended.  Also morning nausea thought to be GERD and Protonix was increased.  Nausea seems to drive markedly high BP (resolves with nausea control).  - Continue reglan 5 mg tid/ac and Marinol.  - Continue Protonix bid.  -Continue eythromycin indefinitely.   6. DM: HgbA1c well-controlled recently.   7. HTN: Stable.   8. PAD: Moderate bilateral disease on ABIs in 8/18.  Denies claudication currently, no pedal ulcers.  9. Atrial flutter: Paroxysmal.  He has been in NSR recently.  10. Difficult IV access: Will discuss port placement at [facility].  Has to have a central line for labs/IV access when he comes to hospital. Hold off porta cath for now. Will ask dialysis center to collect labs. If he has another hospital admit will consider Porta Cath.   Follow up 2 weeks.    Darrick Grinder, NP  04/05/2018

## 2018-04-05 NOTE — Progress Notes (Signed)
Patient presents for hospital  follow up in [facility] today with friend. Reports no problems with VAD equipment or concerns with drive line.   None to rare PI event noted on VAD history except he did have 25 PI events on 04/03/18 in the morning hours. He reports this was the time he was working with home PT, denies dizziness during this time. He did not drink anything prior to exercise; asked him to drink fluid prior to future PT work outs. Pt verbalized understanding of same.   He is using walker today and ambulating without difficulty. No c/o SOB or fatigue, says he is feeling good,   Denies any N&V; remains on erythromycin 250 mg tid since last hospitalization 03/23/18. He also is taking his marinol and reglan as ordered. He denies holding any meds, including warfarin.   He reports he is handling dialysis "better". His dry weight has been adjusted from 76 to 77 kg per Dr. Posey Pronto. H reports removal of 3 liters each session.  Will not draw labs today since pt is a difficult stick. Checked with IR; to have porta-cath placed for IV access, pt's INR must be 1.5 or less. Per Dr. Aundra Dubin, will hold off for now. Will ask dialysis to add monthly LDH. Pt is checking home INR's.  Vital Signs:  Doppler Pressure 173.8 Automatc BP:  95/67 (86) HR:  84 SPO2: UTO %  Weight:  173.8 lb w/o eqt Last weight: 172.4 lb   VAD Indication: Destination therapy- Pt is scheulued for transplant eval at [facility]   VAD interrogation & Equipment Management: Speed:5500 Flow: 4.5 Power:4.5 w    PI: 3.3  Alarms: none Events: rare except 25 PI events on 03/29/18  Fixed speed 5500 Low speed limit: 5200  Primary Controller:  Replace back up battery in 29 months. Back up controller:   Replace back up battery in 29 months.  Annual Equipment Maintenance on UBC/PM was performed on 06/2017.   I reviewed the LVAD parameters from today and compared the results to the patient's prior recorded data. LVAD interrogation  was NEGATIVE for significant power changes, NEGATIVE for clinical alarms and STABLE for PI events/speed drops. No programming changes were made and pump is functioning within specified parameters. Pt is performing daily controller and system monitor self tests along with completing weekly and monthly maintenance for LVAD equipment.  LVAD equipment check completed and is in good working order. Back-up equipment present. Charged back up battery and performed self-test on equipment.   Exit Site Care: Drive line is being maintained daily  by his daughter. Drive line site, and old drive line sites are improved. Gauze dressing dry and intact; anchor intact and accurately applied. Existing VAD dressing removed and site care performed using sterile technique. Drive line exit site cleaned with Chlora prep applicators x 2, allowed to dry, and gauzedressing with silver stripre-applied. Exit site healingand unincorporated, the velour is fully implanted at exit site.Smallamount of serousdrainage with no redness, tenderness, foul odor or rash noted.Drive line anchorsreplacedx 2.   Previous drive ine site dressing and packing removed. Small amount of serosanguinous drainage noted on packing. Using sterile technique, site was cleaned with betadine, and repacked with Iodaform packing. Site much improved; able to pack only 2 inches of 0.5 iodoform in site. Site covered with sterile gauze. No redness, tenderness, foul odor, or rash noted.  Will advance to every other day. If drainage noted on either site, instructed to change daily in order to keep dry and intact.  Pt verbalized understanding of same.    Significant Events on VAD Support: -10/2016>> poss drive line infection, CT ABD neg, ID consult-doxy -11/09/16>> admit for poss drive line infection, IV abx -03/24/17>> doxy for poss drive line infection -05/04/17>> drive line debridement with wound-vac -06/2017>> drive line +proteus, IV abx, Bactrim x14  days  Device:St jude Dual Therapies: on 200 bpm Last check: 01/01/18 - 2 hrs Afl/Afib; EMI 04/02/18  BP & Labs:  MAP 84 - Doppler is reflecting MAP  Hgb not done today; difficult venous access - No S/S of bleeding. Specifically denies melena/BRBPR or nosebleeds.  LDH not done today, difficult venous access - established baseline of 245- 350. Denies tea-colored urine. No power elevations noted on interrogation.    Patient Instructions:  1. No change in meds. 2. PharmD will contact you with INR and coumadin dosing 3. Return to [place] [facility] in 2 weeks.    Zada Girt RN [place] Coordinator   Office: [phone] 24/7 Emergency VAD Pager: [phone]

## 2018-04-06 ENCOUNTER — Ambulatory Visit (HOSPITAL_COMMUNITY): Payer: Self-pay

## 2018-04-09 ENCOUNTER — Ambulatory Visit (HOSPITAL_COMMUNITY): Payer: Self-pay

## 2018-04-10 ENCOUNTER — Ambulatory Visit (HOSPITAL_COMMUNITY): Payer: Self-pay | Admitting: Pharmacist

## 2018-04-10 DIAGNOSIS — Z5181 Encounter for therapeutic drug level monitoring: Secondary | ICD-10-CM

## 2018-04-10 DIAGNOSIS — Z95811 Presence of heart assist device: Secondary | ICD-10-CM

## 2018-04-10 LAB — POCT INR: INR: 2.4 (ref 2–3)

## 2018-04-11 ENCOUNTER — Ambulatory Visit (HOSPITAL_COMMUNITY): Payer: Self-pay

## 2018-04-13 ENCOUNTER — Ambulatory Visit (HOSPITAL_COMMUNITY): Payer: Self-pay

## 2018-04-16 ENCOUNTER — Ambulatory Visit (HOSPITAL_COMMUNITY): Payer: Self-pay

## 2018-04-16 ENCOUNTER — Telehealth: Payer: Self-pay | Admitting: Cardiology

## 2018-04-16 NOTE — Telephone Encounter (Signed)
Has been on both semi-chronically. Will follow EKG for QT prolongation.   Discussed with MD.   Beryle Beams" Chacra, PA-C 04/16/2018 2:17 PM

## 2018-04-16 NOTE — Telephone Encounter (Signed)
Pt seen in CHF clinic.  Will route to them.

## 2018-04-16 NOTE — Telephone Encounter (Signed)
New Message        Drug to Drug interaction ERY 250 mg interacting with  Celexa 20 mg, pls advise

## 2018-04-17 ENCOUNTER — Ambulatory Visit (HOSPITAL_COMMUNITY): Payer: Self-pay | Admitting: Pharmacist

## 2018-04-17 DIAGNOSIS — Z95811 Presence of heart assist device: Secondary | ICD-10-CM

## 2018-04-17 DIAGNOSIS — I824Y9 Acute embolism and thrombosis of unspecified deep veins of unspecified proximal lower extremity: Secondary | ICD-10-CM

## 2018-04-17 DIAGNOSIS — Z5181 Encounter for therapeutic drug level monitoring: Secondary | ICD-10-CM

## 2018-04-17 LAB — POCT INR: INR: 3.1 — AB (ref 2.0–3.0)

## 2018-04-18 ENCOUNTER — Ambulatory Visit (HOSPITAL_COMMUNITY): Payer: Self-pay

## 2018-04-20 ENCOUNTER — Ambulatory Visit (HOSPITAL_COMMUNITY): Payer: Self-pay

## 2018-04-23 ENCOUNTER — Ambulatory Visit (HOSPITAL_COMMUNITY): Payer: Self-pay

## 2018-04-24 ENCOUNTER — Other Ambulatory Visit (HOSPITAL_COMMUNITY): Payer: Self-pay | Admitting: *Deleted

## 2018-04-24 ENCOUNTER — Ambulatory Visit (HOSPITAL_COMMUNITY)
Admission: RE | Admit: 2018-04-24 | Discharge: 2018-04-24 | Disposition: A | Discharge status: Home / Self Care | Payer: BLUE CROSS/BLUE SHIELD | Source: Ambulatory Visit | Attending: Internal Medicine | Admitting: Internal Medicine

## 2018-04-24 VITALS — BP 114/87 | HR 90 | Wt 177.2 lb

## 2018-04-24 DIAGNOSIS — E1151 Type 2 diabetes mellitus with diabetic peripheral angiopathy without gangrene: Secondary | ICD-10-CM | POA: Diagnosis not present

## 2018-04-24 DIAGNOSIS — E1143 Type 2 diabetes mellitus with diabetic autonomic (poly)neuropathy: Secondary | ICD-10-CM | POA: Insufficient documentation

## 2018-04-24 DIAGNOSIS — R110 Nausea: Secondary | ICD-10-CM | POA: Diagnosis not present

## 2018-04-24 DIAGNOSIS — K3184 Gastroparesis: Secondary | ICD-10-CM | POA: Diagnosis not present

## 2018-04-24 DIAGNOSIS — Z95811 Presence of heart assist device: Secondary | ICD-10-CM | POA: Insufficient documentation

## 2018-04-24 DIAGNOSIS — I5022 Chronic systolic (congestive) heart failure: Secondary | ICD-10-CM | POA: Diagnosis not present

## 2018-04-24 DIAGNOSIS — Z86718 Personal history of other venous thrombosis and embolism: Secondary | ICD-10-CM | POA: Diagnosis not present

## 2018-04-24 DIAGNOSIS — I2510 Atherosclerotic heart disease of native coronary artery without angina pectoris: Secondary | ICD-10-CM | POA: Diagnosis not present

## 2018-04-24 DIAGNOSIS — E785 Hyperlipidemia, unspecified: Secondary | ICD-10-CM | POA: Insufficient documentation

## 2018-04-24 DIAGNOSIS — I4892 Unspecified atrial flutter: Secondary | ICD-10-CM | POA: Diagnosis not present

## 2018-04-24 DIAGNOSIS — Z7982 Long term (current) use of aspirin: Secondary | ICD-10-CM | POA: Insufficient documentation

## 2018-04-24 DIAGNOSIS — K5090 Crohn's disease, unspecified, without complications: Secondary | ICD-10-CM | POA: Insufficient documentation

## 2018-04-24 DIAGNOSIS — D6851 Activated protein C resistance: Secondary | ICD-10-CM | POA: Diagnosis not present

## 2018-04-24 DIAGNOSIS — Z7901 Long term (current) use of anticoagulants: Secondary | ICD-10-CM

## 2018-04-24 DIAGNOSIS — Z79899 Other long term (current) drug therapy: Secondary | ICD-10-CM | POA: Diagnosis not present

## 2018-04-24 DIAGNOSIS — Z9049 Acquired absence of other specified parts of digestive tract: Secondary | ICD-10-CM | POA: Diagnosis not present

## 2018-04-24 DIAGNOSIS — I252 Old myocardial infarction: Secondary | ICD-10-CM | POA: Insufficient documentation

## 2018-04-24 DIAGNOSIS — Z992 Dependence on renal dialysis: Secondary | ICD-10-CM | POA: Diagnosis not present

## 2018-04-24 DIAGNOSIS — E1122 Type 2 diabetes mellitus with diabetic chronic kidney disease: Secondary | ICD-10-CM | POA: Diagnosis not present

## 2018-04-24 DIAGNOSIS — I255 Ischemic cardiomyopathy: Secondary | ICD-10-CM | POA: Insufficient documentation

## 2018-04-24 DIAGNOSIS — K219 Gastro-esophageal reflux disease without esophagitis: Secondary | ICD-10-CM | POA: Insufficient documentation

## 2018-04-24 DIAGNOSIS — I132 Hypertensive heart and chronic kidney disease with heart failure and with stage 5 chronic kidney disease, or end stage renal disease: Secondary | ICD-10-CM | POA: Diagnosis present

## 2018-04-24 DIAGNOSIS — N186 End stage renal disease: Secondary | ICD-10-CM

## 2018-04-24 DIAGNOSIS — R918 Other nonspecific abnormal finding of lung field: Secondary | ICD-10-CM | POA: Insufficient documentation

## 2018-04-24 DIAGNOSIS — Z5181 Encounter for therapeutic drug level monitoring: Secondary | ICD-10-CM

## 2018-04-24 DIAGNOSIS — Z955 Presence of coronary angioplasty implant and graft: Secondary | ICD-10-CM | POA: Insufficient documentation

## 2018-04-24 DIAGNOSIS — I10 Essential (primary) hypertension: Secondary | ICD-10-CM | POA: Diagnosis not present

## 2018-04-24 DIAGNOSIS — Z794 Long term (current) use of insulin: Secondary | ICD-10-CM | POA: Diagnosis not present

## 2018-04-24 NOTE — Progress Notes (Signed)
Patient presents for hospital  follow up in [facility] today with friend. Reports no problems with VAD equipment or concerns with drive line.   He is using walker today and ambulating without difficulty. No c/o SOB or fatigue, says he is feeling good.  Denies any N&V; remains on erythromycin 250 mg tid since last hospitalization 03/23/18. He also is taking his marinol and reglan as ordered. He denies holding any meds, including warfarin.    Vital Signs:  Doppler Pressure 92 Automatc BP:  114/87 (96) HR:  90 SPO2: 98% on RA  Weight:  177.2 lb w/eqip Last weight: 173.8 lb   VAD Indication: Destination therapy- Pt is scheulued for transplant eval at [facility]   VAD interrogation & Equipment Management: Speed:5500 Flow: 4.3 Power:4.5 w    PI: 3.2  Alarms: none Events: rare   Fixed speed 5500 Low speed limit: 5200  Primary Controller:  Replace back up battery in 29 months. Back up controller:   Replace back up battery in 29 months.  Annual Equipment Maintenance on UBC/PM was performed on 06/2017.   I reviewed the LVAD parameters from today and compared the results to the patient's prior recorded data. LVAD interrogation was NEGATIVE for significant power changes, NEGATIVE for clinical alarms and STABLE for PI events/speed drops. No programming changes were made and pump is functioning within specified parameters. Pt is performing daily controller and system monitor self tests along with completing weekly and monthly maintenance for LVAD equipment.  LVAD equipment check completed and is in good working order. Back-up equipment present. Charged back up battery and performed self-test on equipment.   Exit Site Care: Drive line is being maintained daily  by his daughter. Drive line site, and old drive line sites are improved. Gauze dressing dry and intact; anchor intact and accurately applied. Existing VAD dressing removed and site care performed using sterile technique. Drive line  exit site cleaned with Chlora prep applicators x 2, allowed to dry, and gauzedressing with silver stripre-applied. Exit site healingand unincorporated, the velour is fully implanted at exit site.Smallamount of serousdrainage with no redness, tenderness, foul odor or rash noted.Drive line anchor intact.  Previous drive ine site dressing and packing removed. Small amount of serosanguinous drainage noted on packing. Using sterile technique, site was cleaned with betadine, and repacked with Iodaform packing. Site much improved; able to pack only 1 inches of 0.5 iodoform in site. Site covered with sterile gauze. No redness, tenderness, foul odor, or rash noted.  Continue dressing changes every other day. If increased drainage noted on either site, instructed to change daily in order to keep dry and intact. Pt verbalized understanding of same.    Significant Events on VAD Support: -10/2016>> poss drive line infection, CT ABD neg, ID consult-doxy -11/09/16>> admit for poss drive line infection, IV abx -03/24/17>> doxy for poss drive line infection -05/04/17>> drive line debridement with wound-vac -06/2017>> drive line +proteus, IV abx, Bactrim x14 days  Device:St jude Dual Therapies: on 200 bpm Last check: 01/01/18 - 2 hrs Afl/Afib; EMI 04/02/18  BP & Labs:  MAP 92 - Doppler is reflecting MAP  Hgb not done today; difficult venous access - No S/S of bleeding. Specifically denies melena/BRBPR or nosebleeds.  LDH not done today, difficult venous access - established baseline of 245- 350. Denies tea-colored urine. No power elevations noted on interrogation.   Will not draw labs today since pt is a difficult stick. He is doing home INR checks. Dialysis center sent over labs that  were drawn on 04/18/18. WBC 12  Hgb 10.8  Hct 34.2  LDH 208  Creatinine 7.61  Patient Instructions:  1. No change in meds. 1. Continue every other day dressing changes to both drive line and packing of old drive line  site.  2. Will see in VAD clinic in 1 month.   [name] RN [place] Coordinator   Office: [phone] 24/7 Emergency VAD Pager: [phone]

## 2018-04-24 NOTE — Progress Notes (Signed)
Patient presents for hospital  follow up in [facility] today with friend. Reports no problems with VAD equipment or concerns with drive line.   He is using walker today and ambulating without difficulty. No c/o SOB or fatigue, says he is feeling good.  Denies any N&V; remains on erythromycin 250 mg tid since last hospitalization 03/23/18. He also is taking his marinol and reglan as ordered. He denies holding any meds, including warfarin.    Vital Signs:  Doppler Pressure 92 Automatc BP:  114/87 (96) HR:  90 SPO2: 98% on RA  Weight:  177.2 lb w/eqip Last weight: 173.8 lb   VAD Indication: Destination therapy- Pt is scheulued for transplant eval at [facility]   VAD interrogation & Equipment Management: Speed:5500 Flow: 4.3 Power:4.5 w PI: 3.2  Alarms: none Events: rare   Fixed speed 5500 Low speed limit: 5200  Primary Controller: Replace back up battery in 29 months. Back up controller: Replace back up battery in 28month.  Annual Equipment Maintenance on UBC/PM was performed on 06/2017.  I reviewed the LVAD parameters from todayand compared the results to the patient's prior recorded data.LVAD interrogation was NEGATIVEfor significant power changes, NEGATIVEfor clinicalalarms and STABLEfor PI events/speed drops. No programming changes were madeand pump is functioning within specified parameters. Pt is performing daily controller and system monitor self tests along with completing weekly and monthly maintenance for LVAD equipment.  LVAD equipment check completed and is in good working order. Back-up equipment present. Charged back up battery and performed self-test on equipment.   Exit Site Care: Drive line is being maintained daily by his daughter. Drive line site, and old drive line sites are improved.Gauze dressing dry and intact; anchor intact and accurately applied. Existing VAD dressing removed and site care performed using sterile  technique. Drive line exit site cleaned with Chlora prep applicators x 2, allowed to dry, and gauzedressing with silver stripre-applied. Exit site healingand unincorporated, the velour is fully implanted at exit site.Smallamount of serousdrainage with no redness, tenderness, foul odor or rash noted.Drive line anchor intact.  Previous drive ine site dressing and packing removed. Small amount of serosanguinous drainage noted on packing. Using sterile technique, site was cleaned with betadine, and repacked with Iodaform packing. Site much improved; able to pack only 1 inches of 0.5 iodoform in site. Site covered with sterile gauze. No redness, tenderness, foul odor, or rash noted. Continue dressing changes every other day. If increased drainage noted on either site, instructed to change daily in order to keep dry and intact. Pt verbalized understanding of same.    Significant Events on VAD Support: -10/2016>> poss drive line infection, CT ABD neg, ID consult-doxy -11/09/16>> admit for poss drive line infection, IV abx -03/24/17>> doxy for poss drive line infection -05/04/17>> drive line debridement with wound-vac -06/2017>> drive line +proteus, IV abx, Bactrim x14 days  Device:St jude Dual Therapies: on 200 bpm Last check: 01/01/18 - 2 hrs Afl/Afib; EMI 04/02/18  BP &Labs:  MAP 92 - Doppler is reflecting MAP  Hgb not done today; difficult venous access - No S/S of bleeding. Specifically denies melena/BRBPR or nosebleeds.  LDH not done today, difficult venous access - established baseline of 245- 350. Denies tea-colored urine. No power elevations noted on interrogation.   Will not draw labs today since pt is a difficult stick. He is doing home INR checks. Dialysis center sent over labs that were drawn on 04/18/18. WBC 12  Hgb 10.8  Hct 34.2  LDH 208  Creatinine 7.61  Patient Instructions:  1. No change in meds. 1. Continue every other day dressing changes to both drive line and  packing of old drive line site.  2. Will see in VAD clinic in 1 month.   [name] RN VAD Coordinator   Office: [phone] 24/7 Emergency VAD Pager: [phone] Cardiology: Dr. Aundra Dubin  HPI: 52 y.o. with history of CAD s/p anterior MI in 2010 and NSTEMI in 3/18 with DES to pRCA and mLAD and ischemic cardiomyopathy now s/p Heartmate 3 LVAD and ESRD presents for followup of CHF/LVAD.  Patient developed low output heart failure.  He was not deemed to be a good transplant candidate.  He was admitted in 10/18 and Heartmate 3 LVAD was placed.  He had baseline CKD stage 3 likely from diabetic nephropathy and CHF.  He developed peri-op AKI and ended up on dialysis.  No renal recovery to date.  St Jude ICD has been turned back on and ramp echo was done prior to discharge in 10/18, speed increased to 5600 rpm.   He was admitted in 11/18 with nausea/vomiting and abdominal pain.  This appears to have been due to diabetic gastroparesis (extensive GI evaluation). He had atrial flutter this admission that was converted to NSR with amiodarone.  He improved considerably with scheduled Reglan and erythromycin. He was re-admitted later in 11/18 with the same symptoms, suspect diabetic gastroparesis and was again treated with erythromycin.  He was not able to start domperidone due to prolonged QT interval. He had nausea again about a week ago but was able to manage at home with Phenergan suppositories.   Admitted 1/8 -> 09/16/17 for uncontrolled vomiting in setting of diabetic gastroparesis. Improved with phenergan suppositories and given erythromycin TID for 7 days post-discharge.    He was admitted in 2/19 for AV graft placement.  While in the hospital, we turned down his speed.  Since then, he has had considerably fewer PI events on HD days.    He was admitted in 4/19 with nausea/vomiting again, probably gastroparesis flare.   He was admitted in 5/19 with nausea/vomiting, likely gastroparesis flare, treated with  erythromycin course which he is finishing.   He has been evaluated at [facility] by Dr. Derrill Kay (gastroparesis specialist).   Gastric emptying study, surprisingly, was near normal. However, electrogastrogram showed poor gastric accomodation/capacity, "bradygastria."  Frequent small meals were recommended. Also, early am nausea was thought to be possible GERD.    Admitted 6/26 - 03/19/18 for emergent VAD exchange 03/01/18 in the setting of HM3 outflow graft kink. He required CVVHD post op, but was transtioned back to [facility] without problems. Course complicated by leukocytosis and fever. CT abd/chest negative for infection and blood cultures were negative. Post op pain well controlled withraretramadol. Able to tolerate diet. Evaluated by PT/OT and HH PT recommended for discharge. Ramp echo completed prior to discharge with speed optimization. Resumed81 mg ASA + coumadin with INR goal 2-3.  He was admitted 03/23/18 with nausea/gastroparesis symptoms and marked HTN at outpatient HD.  Erythromycin was restarted and gastroparesis symptoms were controlled with fall in BP and discharge on 03/25/18.   Today he returns for VAD follow up. Overall feeling fine. Denies SOB/PND/Orthopnea. Continues on HD three days a week.  Having nausea about 15 minutes in the morning.  Appetite ok. No fever or chills. No BRBPR. No problems with drive line;[name]  Dry weight 77 kg. Taking all medications.   Labs (11/18): hgb 10.4 => 8.7, LDH 213 Labs (08/04/17): hgb 9 Labs (  1/19): HgbA1c 6.1 Labs (2/19): hgb 13.2 Labs (3/19): hgb 11.1 Labs (7/19): hgb 9.6 Labs 03/25/2018): Creatinine 4.45 Hgb 9.6 WBC 12.6  INR 2.2  PMH: 1. Chronic systolic CHF: Ischemic cardiomyopathy. St Jude ICD.  - Echo (11/17) with EF 25-30%, moderate diastolic dysfunction, mild MR, mildly dilated RV with moderately decreased systolic function, peak RV-RA gradient 48 mmHg.  - Echo (3/18) with EF 25-30%, wall motion abnormalities, normal RV size and systolic  function.  - Admission 3/18 with cardiogenic shock and AKI, required milrinone gtt.  - Echo (8/18) with EF 20-25%, mild LVH, moderate LV dilation, mild MR.  - CPX (8/18) with peak VO2 12.4, VE/VCO2 slope 35, RER 1.05 => severe HR limitation.  - Heartmate 3 LVAD 10/18.  - Pump thrombosis in setting of HM3 outflow graft kink.  Pump exchange 6/19 for HM3.  2. CAD: Anterior MI 2005, had bifurcation stenting LAD/diagonal. Repeat cath 2010 with patent stents.  - NSTEMI 3/18: LHC with 99% ulcerated lesion proximal RCA, 95% mid LAD => PCI with DES to proximal RCA and mid LAD.  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  - Lower extremity venous dopplers (8/18): chronic DVT - V/Q scan (8/18): Normal, no PE.  4. Crohns disease: History of colectomy.  5. GERD 6. Diabetic gastroparesis: Admission 11/18 with abdominal pain, nausea/vomiting.  - [facility] workup 2019: Gastric emptying study near normal.  Negative breath test (no evidence for small bowel bacterial overgrowth).  Electrogastrogram showed poor gastric accomodation/capacity, "bradygastria."  7. HTN 8. Hyperlipidemia 9. Type II diabetes  10. ESRD: Post-op LVAD surgery.  11. Carotid dopplers (8/18): mild bilateral stenosis.  12. PFTs (8/18): Mild restriction (no IDL on CT chest).  13. Lung nodules: CT chest 8/18 with small bilateral nodules.  14. PAD: ABIs with moderate bilateral disease in 8/18.  15. Atrial flutter: In 11/18 associated with nausea/vomiting from gastroparesis.   Current Outpatient Medications  Medication Sig Dispense Refill  . aspirin EC 81 MG tablet Take 1 tablet (81 mg total) by mouth daily. 90 tablet 3  . atorvastatin (LIPITOR) 40 MG tablet Take 1 tablet (40 mg total) by mouth daily at 6 PM. 30 tablet 6  . citalopram (CELEXA) 20 MG tablet Take 1 tablet (20 mg total) by mouth daily. 30 tablet 6  . docusate sodium (COLACE) 100 MG capsule Take 1 capsule (100 mg total) by mouth 2 (two) times daily as needed for mild constipation.    [name]  dronabinol (MARINOL) 2.5 MG capsule Take 1 capsule (2.5 mg total) by mouth 2 (two) times daily before lunch and supper. 60 capsule 5  . erythromycin (E-MYCIN) 250 MG tablet Take 1 tablet (250 mg total) by mouth 3 (three) times daily. 90 tablet 4  . insulin aspart (NOVOLOG) 100 UNIT/ML injection Inject 5 Units into the skin 3 (three) times daily with meals. 1 vial 0  . insulin glargine (LANTUS) 100 UNIT/ML injection Inject 0.1 mLs (10 Units total) into the skin daily.    . isosorbide-hydrALAZINE (BIDIL) 20-37.5 MG tablet Take 1.5 tablets by mouth every 8 (eight) hours. Take 1.5 tablets every 8 hours on non dialysis days. (Patient taking differently: Take 1.5 tablets by mouth See admin instructions. Take 1.5 tablets every 8 hours on non dialysis days.) 135 tablet 6  . LORazepam (ATIVAN) 0.5 MG tablet Take 1 tablet (0.5 mg total) by mouth every 12 (twelve) hours as needed (nausea). 60 tablet 2  . metoCLOPramide (REGLAN) 10 MG tablet Take 1 tablet (10 mg total) by mouth 3 (  three) times daily. 90 tablet 6  . ondansetron (ZOFRAN ODT) 4 MG disintegrating tablet Take 1 tablet (4 mg total) by mouth every 6 (six) hours as needed for nausea or vomiting. (Patient not taking: Reported on 04/05/2018) 40 tablet 1  . pantoprazole (PROTONIX) 40 MG tablet Take 40 mg by mouth 2 (two) times daily.     . promethazine (PHENERGAN) 12.5 MG tablet Take 1 tablet (12.5 mg total) by mouth every 6 (six) hours as needed for nausea or vomiting. (Patient not taking: Reported on 04/05/2018) 30 tablet 0  . warfarin (COUMADIN) 5 MG tablet Take 1 tablet (5 mg total) by mouth daily at 6 PM. 45 tablet 6   No current facility-administered medications for this encounter.     Metformin and related   Review of systems complete and found to be negative unless listed in HPI.    BP (!) 92/0   Wt 80.4 kg (177 lb 3.2 oz)   BMI 26.94 kg/m    VAD interrogation & Equipment Management: Flow 4,3 Speed 5500 Power  4.5w  Wt Readings from Last  3 Encounters:  04/24/18 80.4 kg (177 lb 3.2 oz)  04/05/18 78.8 kg (173 lb 12.8 oz)  03/27/18 80.5 kg (177 lb 6.4 oz)     Physical Exam: GENERAL: Well appearing, male. Walked in the clinic with a rolling walker.   HEENT: normal  NECK: Supple, JVP 6-7  .  2+ bilaterally, no bruits.  No lymphadenopathy or thyromegaly appreciated.   CARDIAC:  Mechanical heart sounds with LVAD hum present.  LUNGS:  Clear to auscultation bilaterally.  ABDOMEN:  Soft, round, nontender, positive bowel sounds x4.     LVAD exit site:  Dressing dry and intact.  No erythema or drainage.  Stabilization device present and accurately applied.  Driveline dressing is being changed daily per sterile technique. Old driveline ~ 1cm no exudate.  EXTREMITIES:  Warm and dry, no cyanosis, clubbing, rash or edema RUE AVFNEUROLOGIC:  Alert and oriented x 4.  Gait steady.  No aphasia.  No dysarthria.  Affect pleasant.       ASSESSMENT AND PLAN: 1. Chronic systolic CHF: Ischemic cardiomyopathy.  St Jude ICD.  NSTEMI in March 2018 with DES to LAD and RCA, complicated by cardiogenic shock, low output requiring milrinone.  Echo 8/18 with EF 20-25%, moderate MR.  CPX (8/18) with severe functional impairment due to HF.  He is s/p HM3 LVAD placement. He had pump thrombosis 6/19 due to [facility] outflow graft kinking.  He had pump exchange for another HM3. Old driveline site filling in. Continue small about of packing every other day.   LVAD parameters stable.  - Volume managed per HD.   - Continue Bidil 1.5 tab tid on non-HD days.    - Continue ASA 81.  Continue warfarin INR 2-3.    - He has been seen at [facility] for evaluation for heart/kidney transplant.  2. ESRD: Developed peri-operatively after HM3 placement.   -Tolerating HD.  -, AV fistula in his arm is working currently. 3. CAD: NSTEMI in 3/18. LHC with 99% ulcerated lesion proximal RCA with left to right collaterals, 95% mid LAD stenosis after mid LAD stent. s/p PCI to RCA and LAD on  11/25/16.   - Continue statin and ASA. 4. h/o DVTs: Factor V Leiden heterozygote. - On coumadin. 5. Diabetic gastroparesis: Difficult to manage. He has seen gastroparesis specialist at [facility]. Surprisingly, gastric emptying study was normal.  However, electrogastrogram showed poor gastric accomodation/capacity.  Therefore, small frequent meals recommended.   Having about 15 minutes of nausea in the morning. I have asked him to elevate HOB.   - Continue reglan 5 mg tid/ac and Marinol.  - Continue Protonix bid.  -Continue eythromycin indefinitely.   6. DM: HgbA1c well-controlled recently.   7. HTN: Stable today.   8. PAD: Moderate bilateral disease on ABIs in 8/18.  Denies claudication currently, no pedal ulcers.  9. Atrial flutter: Paroxysmal.  He has been in NSR recently.  10. Difficult IV access: Will discuss port placement at [facility].  Has to have a central line for labs/IV access when he comes to hospital. Hold off porta cath for now. Will ask dialysis center to collect labs. If he has another hospital admit will consider Porta Cath.    Follow up 4 weeks.  Greater than 50% of the 25 minute visit was spent in counseling/coordination of care regarding disease state education, salt/fluid restriction, sliding scale diuretics, and medication compliance.   Darrick Grinder, NP  04/24/2018

## 2018-04-24 NOTE — Patient Instructions (Signed)
1. Continue every other day dressing changes to both drive line and packing of old drive line site.  2. Will see in VAD clinic in 1 month.

## 2018-04-25 ENCOUNTER — Ambulatory Visit (HOSPITAL_COMMUNITY): Payer: Self-pay

## 2018-04-25 ENCOUNTER — Telehealth (HOSPITAL_COMMUNITY): Payer: Self-pay | Admitting: *Deleted

## 2018-04-25 NOTE — Telephone Encounter (Signed)
Spoke with [name] regarding INR result of 2.7. Per Doroteo Bradford PharmD, continue to take Coumadin 27m daily. He verbalized understanding.   AEmerson MonteRN VAD Coordinator  Office [phone] Pager [phone]

## 2018-04-27 ENCOUNTER — Ambulatory Visit (HOSPITAL_COMMUNITY): Payer: Self-pay

## 2018-04-30 ENCOUNTER — Ambulatory Visit (HOSPITAL_COMMUNITY): Payer: Self-pay

## 2018-05-02 ENCOUNTER — Ambulatory Visit (HOSPITAL_COMMUNITY): Payer: Self-pay

## 2018-05-02 ENCOUNTER — Ambulatory Visit (HOSPITAL_COMMUNITY): Payer: Self-pay | Admitting: Pharmacist

## 2018-05-02 DIAGNOSIS — I824Y9 Acute embolism and thrombosis of unspecified deep veins of unspecified proximal lower extremity: Secondary | ICD-10-CM

## 2018-05-02 DIAGNOSIS — Z5181 Encounter for therapeutic drug level monitoring: Secondary | ICD-10-CM

## 2018-05-02 DIAGNOSIS — Z95811 Presence of heart assist device: Secondary | ICD-10-CM

## 2018-05-02 LAB — POCT INR: INR: 2.6 (ref 2.0–3.0)

## 2018-05-04 ENCOUNTER — Ambulatory Visit (HOSPITAL_COMMUNITY): Payer: Self-pay

## 2018-05-08 ENCOUNTER — Other Ambulatory Visit: Payer: Self-pay

## 2018-05-08 DIAGNOSIS — Z794 Long term (current) use of insulin: Secondary | ICD-10-CM

## 2018-05-08 DIAGNOSIS — E118 Type 2 diabetes mellitus with unspecified complications: Secondary | ICD-10-CM

## 2018-05-09 ENCOUNTER — Ambulatory Visit (HOSPITAL_COMMUNITY): Payer: Self-pay | Admitting: Pharmacist

## 2018-05-09 ENCOUNTER — Ambulatory Visit (HOSPITAL_COMMUNITY): Payer: Self-pay

## 2018-05-09 DIAGNOSIS — Z5181 Encounter for therapeutic drug level monitoring: Secondary | ICD-10-CM

## 2018-05-09 DIAGNOSIS — I824Y9 Acute embolism and thrombosis of unspecified deep veins of unspecified proximal lower extremity: Secondary | ICD-10-CM

## 2018-05-09 DIAGNOSIS — Z95811 Presence of heart assist device: Secondary | ICD-10-CM

## 2018-05-09 LAB — POCT INR: INR: 2.9 (ref 2.0–3.0)

## 2018-05-09 MED ORDER — INSULIN GLARGINE 100 UNIT/ML ~~LOC~~ SOLN: 10.0000 [IU] | Freq: Every day | SUBCUTANEOUS | 5 refills | Status: DC

## 2018-05-11 ENCOUNTER — Ambulatory Visit (HOSPITAL_COMMUNITY): Payer: Self-pay

## 2018-05-14 ENCOUNTER — Ambulatory Visit (HOSPITAL_COMMUNITY): Payer: Self-pay

## 2018-05-14 ENCOUNTER — Telehealth: Payer: Self-pay | Admitting: Family Medicine

## 2018-05-14 NOTE — Telephone Encounter (Signed)
Made contact with patient and has an appt for 06/07/18 at 3:50 pm.

## 2018-05-14 NOTE — Telephone Encounter (Signed)
Made appointment for 06/07/18 at 3:50 pm.

## 2018-05-15 ENCOUNTER — Encounter: Payer: Self-pay | Admitting: *Deleted

## 2018-05-15 ENCOUNTER — Ambulatory Visit (HOSPITAL_COMMUNITY): Payer: Self-pay | Admitting: Pharmacist

## 2018-05-15 ENCOUNTER — Ambulatory Visit (INDEPENDENT_AMBULATORY_CARE_PROVIDER_SITE_OTHER): Payer: BLUE CROSS/BLUE SHIELD | Admitting: Vascular Surgery

## 2018-05-15 ENCOUNTER — Other Ambulatory Visit: Payer: Self-pay

## 2018-05-15 ENCOUNTER — Encounter: Payer: Self-pay | Admitting: Vascular Surgery

## 2018-05-15 DIAGNOSIS — Z95811 Presence of heart assist device: Secondary | ICD-10-CM

## 2018-05-15 DIAGNOSIS — N186 End stage renal disease: Secondary | ICD-10-CM

## 2018-05-15 DIAGNOSIS — Z5181 Encounter for therapeutic drug level monitoring: Secondary | ICD-10-CM

## 2018-05-15 DIAGNOSIS — I824Y9 Acute embolism and thrombosis of unspecified deep veins of unspecified proximal lower extremity: Secondary | ICD-10-CM

## 2018-05-15 LAB — POCT INR: INR: 2.2 (ref 2.0–3.0)

## 2018-05-15 NOTE — Progress Notes (Signed)
Patient name: [name] MRN: [id number] DOB: [birth year] Sex: male  REASON FOR VISIT: Low flow volumes and right upper extremity Artegraft on hemodialysis  HPI: [name] is a 52 y.o. male with end-stage renal disease on hemodialysis via right upper arm graft as well as congestive heart failure with LVAD on Coumadin that presents for evaluation of his right upper extremity fistula.  Patient states they last used his fistula yesterday and had trouble with flow volumes.  He denies any arm swelling.  He denies any symptoms in his right hand like numbness tingling weakness.  He has a right upper extremity graft (Artegraft) that was placed 10/31/2017 by Dr. Bridgett Larsson.  He has undergone a simple thrombectomy of the graft as recently as April 2019.  Past Medical History:  Diagnosis Date  . Angina   . ASCVD (arteriosclerotic cardiovascular disease)    , Anterior infarction 2005, LAD diagonal bifurcation intervention 03/2004  . Automatic implantable cardiac defibrillator -St. Jude's       . Benign neoplasm of colon   . CHF (congestive heart failure) ([place])   . Chronic systolic heart failure ([place])   . Coronary artery disease     Widely patent previously placed stents in the left anterior   . Crohn's disease ([place])   . 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 Dialysis patient ([place])   . Dyspnea   . Gastroparesis   . GERD (gastroesophageal reflux disease)   . High cholesterol   . Hyperlipidemia   . Hypersomnolent    Previous diagnosis of narcolepsy  . Hypertension, essential   . Ischemic cardiomyopathy    Ejection fraction 15-20% catheterization 2010  . Type II or unspecified type diabetes mellitus without mention of complication, not stated as uncontrolled   . Unspecified gastritis and gastroduodenitis without mention of hemorrhage     Past Surgical History:  Procedure Laterality Date  . APPENDECTOMY    . AV FISTULA PLACEMENT Right 06/26/2017   Procedure:  ARTERIOVENOUS (AV) FISTULA CREATION VERSUS GRAFT RIGHT  ARM;  Surgeon: Conrad Oliver, MD;  Location: [facility] OR;  Service: Vascular;  Laterality: Right;  . AV FISTULA PLACEMENT Right 10/31/2017   Procedure: INSERTION OF ARTERIOVENOUS (AV) ARTEGRAFT,  RIGHT UPPER ARM;  Surgeon: Conrad Lancaster, MD;  Location: [place];  Service: Vascular;  Laterality: Right;  . CARDIAC DEFIBRILLATOR PLACEMENT  2010   St. Jude ICD  . COLECTOMY  ~ 2003   "for Crohn's" ascending colon.    . CORONARY STENT INTERVENTION N/A 11/25/2016   Procedure: Coronary Stent Intervention;  Surgeon: Leonie Man, MD;  Location: [place] [facility];  Service: Cardiovascular;  Laterality: N/A;  . EP IMPLANTABLE DEVICE N/A 09/14/2016   Procedure: ICD Generator Changeout;  Surgeon: Deboraha Sprang, MD;  Location: [place] [facility];  Service: Cardiovascular;  Laterality: N/A;  . ESOPHAGOGASTRODUODENOSCOPY N/A 07/12/2017   Procedure: ESOPHAGOGASTRODUODENOSCOPY (EGD);  Surgeon: Jerene Bears, MD;  Location: [facility] END[facility];  Service: Gastroenterology;  Laterality: N/A;  VAD pt so VAD team will need to accompany pt.   [name] FETAL SURGERY FOR CONGENITAL HERNIA     ????? pt has no knowledge of this..    . INGUINAL HERNIA REPAIR    . INSERTION OF DIALYSIS CATHETER Right 06/26/2017   Procedure: INSERTION OF TUNNELED  DIALYSIS CATHETER RIGHT INTERNAL JUGULAR;  Surgeon: Conrad Dulac, MD;  Location: [place];  Service: Vascular;  Laterality: Right;  . INSERTION OF IMPLANTABLE LEFT  VENTRICULAR ASSIST DEVICE N/A 06/12/2017   Procedure: INSERTION OF IMPLANTABLE LEFT VENTRICULAR ASSIST [place] 3;  Surgeon: Ivin Poot, MD;  Location: [place];  Service: Open Heart Surgery;  Laterality: N/A;  HM3 LVAD  CIRC ARREST  NITRIC OXIDE  . INSERTION OF IMPLANTABLE LEFT VENTRICULAR ASSIST DEVICE N/A 02/28/2018   Procedure: REDO INSERTION OF IMPLANTABLE LEFT VENTRICULAR ASSIST DEVICE - HEARTMATE 3;  Surgeon: Ivin Poot, MD;  Location: [place];  Service: Open Heart  Surgery;  Laterality: N/A;  . IR REMOVAL TUN CV CATH W/O FL  02/26/2018  . IR THORACENTESIS ASP PLEURAL SPACE W/IMG GUIDE  06/28/2017  . LIGATION OF ARTERIOVENOUS  FISTULA Right 10/31/2017   Procedure: LIGATION OF BRACHIO-BASILIC VEIN TRANSPOSITION RIGHT ARM;  Surgeon: Conrad Taney, MD;  Location: [place];  Service: Vascular;  Laterality: Right;  . MULTIPLE EXTRACTIONS WITH ALVEOLOPLASTY N/A 06/08/2017   Procedure: MULTIPLE EXTRACTION WITH ALVEOLOPLASTY AND PRE PROSTHETIC SURGERY AS NEEDED;  Surgeon: Lenn Cal, DDS;  Location: [place];  Service: Oral Surgery;  Laterality: N/A;  . RIGHT HEART CATH N/A 06/07/2017   Procedure: RIGHT HEART CATH;  Surgeon: Larey Dresser, MD;  Location: [place] [facility];  Service: Cardiovascular;  Laterality: N/A;  . RIGHT HEART CATH N/A 02/08/2018   Procedure: RIGHT HEART CATH;  Surgeon: Larey Dresser, MD;  Location: [place] [facility];  Service: Cardiovascular;  Laterality: N/A;  . RIGHT/LEFT HEART CATH AND CORONARY ANGIOGRAPHY N/A 11/23/2016   Procedure: Right/Left Heart Cath and Coronary Angiography;  Surgeon: Larey Dresser, MD;  Location: [place] [facility];  Service: Cardiovascular;  Laterality: N/A;  . TEE WITHOUT CARDIOVERSION N/A 06/12/2017   Procedure: TRANSESOPHAGEAL ECHOCARDIOGRAM (TEE);  Surgeon: Prescott Gum, Collier Salina, MD;  Location: [place];  Service: Open Heart Surgery;  Laterality: N/A;  . TEE WITHOUT CARDIOVERSION N/A 02/28/2018   Procedure: TRANSESOPHAGEAL ECHOCARDIOGRAM (TEE);  Surgeon: Prescott Gum, Collier Salina, MD;  Location: [place];  Service: Open Heart Surgery;  Laterality: N/A;  . THROMBECTOMY AND REVISION OF ARTERIOVENTOUS (AV) GORETEX  GRAFT Right 12/12/2017   Procedure: THROMBECTOMY ARTERIOVENTOUS (AV) GORETEX  GRAFT RIGHT UPPER ARM;  Surgeon: Conrad Reiffton, MD;  Location: [facility] OR;  Service: Vascular;  Laterality: Right;    Family History  Problem Relation Age of Onset  . Colon polyps Mother   . Diabetes Mother   . Heart attack Father   . Diabetes  Father   . Stroke Paternal Grandfather   . Colitis Sister   . Heart disease Brother   . Colitis Sister   . Colon cancer Neg Hx     SOCIAL HISTORY: Social History   Tobacco Use  . Smoking status: Never Smoker  . Smokeless tobacco: Never Used  Substance Use Topics  . Alcohol use: No    Allergies  Allergen Reactions  . Metformin And Related Diarrhea    Current Outpatient Medications  Medication Sig Dispense Refill  . aspirin EC 81 MG tablet Take 1 tablet (81 mg total) by mouth daily. 90 tablet 3  . atorvastatin (LIPITOR) 40 MG tablet Take 1 tablet (40 mg total) by mouth daily at 6 PM. 30 tablet 6  . citalopram (CELEXA) 20 MG tablet Take 1 tablet (20 mg total) by mouth daily. 30 tablet 6  . docusate sodium (COLACE) 100 MG capsule Take 1 capsule (100 mg total) by mouth 2 (two) times daily as needed for mild constipation.    [name] dronabinol (MARINOL) 2.5 MG capsule Take 1 capsule (  2.5 mg total) by mouth 2 (two) times daily before lunch and supper. 60 capsule 5  . erythromycin (E-MYCIN) 250 MG tablet Take 1 tablet (250 mg total) by mouth 3 (three) times daily. 90 tablet 4  . insulin aspart (NOVOLOG) 100 UNIT/ML injection Inject 5 Units into the skin 3 (three) times daily with meals. 1 vial 0  . insulin glargine (LANTUS) 100 UNIT/ML injection Inject 0.1 mLs (10 Units total) into the skin daily. 10 mL 5  . isosorbide-hydrALAZINE (BIDIL) 20-37.5 MG tablet Take 1.5 tablets by mouth every 8 (eight) hours. Take 1.5 tablets every 8 hours on non dialysis days. (Patient taking differently: Take 1.5 tablets by mouth See admin instructions. Take 1.5 tablets every 8 hours on non dialysis days.) 135 tablet 6  . LORazepam (ATIVAN) 0.5 MG tablet Take 1 tablet (0.5 mg total) by mouth every 12 (twelve) hours as needed (nausea). 60 tablet 2  . metoCLOPramide (REGLAN) 10 MG tablet Take 1 tablet (10 mg total) by mouth 3 (three) times daily. 90 tablet 6  . ondansetron (ZOFRAN ODT) 4 MG disintegrating tablet  Take 1 tablet (4 mg total) by mouth every 6 (six) hours as needed for nausea or vomiting. 40 tablet 1  . pantoprazole (PROTONIX) 40 MG tablet Take 40 mg by mouth 2 (two) times daily.     . promethazine (PHENERGAN) 12.5 MG tablet Take 1 tablet (12.5 mg total) by mouth every 6 (six) hours as needed for nausea or vomiting. 30 tablet 0  . warfarin (COUMADIN) 5 MG tablet Take 1 tablet (5 mg total) by mouth daily at 6 PM. 45 tablet 6   No current facility-administered medications for this visit.     REVIEW OF SYSTEMS:  [X]  denotes positive finding, [ ]  denotes negative finding Cardiac  Comments:  Chest pain or chest pressure:    Shortness of breath upon exertion: x   Short of breath when lying flat: x   Irregular heart rhythm:        Vascular    Pain in calf, thigh, or hip brought on by ambulation:    Pain in feet at night that wakes you up from your sleep:     Blood clot in your veins:    Arm swelling:   Denies      Pulmonary    Oxygen at home:    Productive cough:     Wheezing:         Neurologic    Sudden weakness in arms or legs:     Sudden numbness in arms or legs:     Sudden onset of difficulty speaking or slurred speech:    Temporary loss of vision in one eye:     Problems with dizziness:         Gastrointestinal    Blood in stool:     Vomited blood:         Genitourinary    Burning when urinating:     Blood in urine:        Psychiatric    Major depression:         Hematologic    Bleeding problems:    Problems with blood clotting too easily:        Skin    Rashes or ulcers:        Constitutional    Fever or chills:      PHYSICAL EXAM: There were no vitals filed for this visit.  GENERAL: The patient is a well-nourished male, in  no acute distress. The vital signs are documented above. CARDIAC: Wearing LVAD. VASCULAR:  Audible bruit over right upper extremity graft with doppler.  No arm swelling.  Hand motor and sensory intact. MUSCULOSKELETAL: There are  no major deformities or cyanosis. NEUROLOGIC: No focal weakness or paresthesias are detected. SKIN: There are no ulcers or rashes noted. PSYCHIATRIC: The patient has a normal affect.  DATA:   No new imaging to review.  Assessment/Plan:  Would be reasonable to proceed with a right upper extremity fistulogram given low flow volumes.  I did interrogate his fistula with a Doppler in clinic today and he has flow through the fistula.  We will need to coordinate hospital admission with the LVAD team to take him off his Coumadin with a heparin bridge.  Will find next available date.   Marty Heck, MD [facility] Office: [phone] Pager: Pump Back

## 2018-05-15 NOTE — Progress Notes (Signed)
Request from [facility] for evaluation of graft due to low arterial pressures.

## 2018-05-16 ENCOUNTER — Ambulatory Visit (HOSPITAL_COMMUNITY): Payer: Self-pay

## 2018-05-16 ENCOUNTER — Encounter: Payer: Self-pay | Admitting: Nephrology

## 2018-05-18 ENCOUNTER — Ambulatory Visit (HOSPITAL_COMMUNITY): Payer: Self-pay

## 2018-05-19 ENCOUNTER — Other Ambulatory Visit (HOSPITAL_COMMUNITY): Payer: Self-pay | Admitting: *Deleted

## 2018-05-19 DIAGNOSIS — Z5181 Encounter for therapeutic drug level monitoring: Secondary | ICD-10-CM

## 2018-05-19 DIAGNOSIS — Z7901 Long term (current) use of anticoagulants: Secondary | ICD-10-CM

## 2018-05-19 DIAGNOSIS — Z95811 Presence of heart assist device: Secondary | ICD-10-CM

## 2018-05-21 ENCOUNTER — Ambulatory Visit (HOSPITAL_COMMUNITY): Payer: Self-pay

## 2018-05-22 ENCOUNTER — Ambulatory Visit (HOSPITAL_BASED_OUTPATIENT_CLINIC_OR_DEPARTMENT_OTHER)
Admission: RE | Admit: 2018-05-22 | Discharge: 2018-05-22 | Disposition: A | Discharge status: Home / Self Care | Payer: BLUE CROSS/BLUE SHIELD | Source: Ambulatory Visit | Attending: Internal Medicine | Admitting: Internal Medicine

## 2018-05-22 ENCOUNTER — Other Ambulatory Visit: Payer: Self-pay | Admitting: *Deleted

## 2018-05-22 ENCOUNTER — Other Ambulatory Visit (HOSPITAL_COMMUNITY): Payer: Self-pay | Admitting: Pharmacist

## 2018-05-22 ENCOUNTER — Encounter (HOSPITAL_COMMUNITY): Payer: Self-pay

## 2018-05-22 VITALS — BP 103/53 | HR 79 | Ht 68.0 in | Wt 180.2 lb

## 2018-05-22 DIAGNOSIS — K5090 Crohn's disease, unspecified, without complications: Secondary | ICD-10-CM

## 2018-05-22 DIAGNOSIS — I132 Hypertensive heart and chronic kidney disease with heart failure and with stage 5 chronic kidney disease, or end stage renal disease: Secondary | ICD-10-CM

## 2018-05-22 DIAGNOSIS — K219 Gastro-esophageal reflux disease without esophagitis: Secondary | ICD-10-CM

## 2018-05-22 DIAGNOSIS — K3184 Gastroparesis: Secondary | ICD-10-CM | POA: Insufficient documentation

## 2018-05-22 DIAGNOSIS — Z86718 Personal history of other venous thrombosis and embolism: Secondary | ICD-10-CM | POA: Insufficient documentation

## 2018-05-22 DIAGNOSIS — I252 Old myocardial infarction: Secondary | ICD-10-CM | POA: Insufficient documentation

## 2018-05-22 DIAGNOSIS — E1143 Type 2 diabetes mellitus with diabetic autonomic (poly)neuropathy: Secondary | ICD-10-CM

## 2018-05-22 DIAGNOSIS — I4892 Unspecified atrial flutter: Secondary | ICD-10-CM | POA: Insufficient documentation

## 2018-05-22 DIAGNOSIS — Z7982 Long term (current) use of aspirin: Secondary | ICD-10-CM

## 2018-05-22 DIAGNOSIS — E1122 Type 2 diabetes mellitus with diabetic chronic kidney disease: Secondary | ICD-10-CM

## 2018-05-22 DIAGNOSIS — I509 Heart failure, unspecified: Secondary | ICD-10-CM | POA: Diagnosis not present

## 2018-05-22 DIAGNOSIS — Z7901 Long term (current) use of anticoagulants: Secondary | ICD-10-CM | POA: Insufficient documentation

## 2018-05-22 DIAGNOSIS — Z9049 Acquired absence of other specified parts of digestive tract: Secondary | ICD-10-CM

## 2018-05-22 DIAGNOSIS — Z452 Encounter for adjustment and management of vascular access device: Secondary | ICD-10-CM | POA: Insufficient documentation

## 2018-05-22 DIAGNOSIS — I2510 Atherosclerotic heart disease of native coronary artery without angina pectoris: Secondary | ICD-10-CM | POA: Insufficient documentation

## 2018-05-22 DIAGNOSIS — I25110 Atherosclerotic heart disease of native coronary artery with unstable angina pectoris: Secondary | ICD-10-CM

## 2018-05-22 DIAGNOSIS — N186 End stage renal disease: Secondary | ICD-10-CM

## 2018-05-22 DIAGNOSIS — Z794 Long term (current) use of insulin: Secondary | ICD-10-CM | POA: Insufficient documentation

## 2018-05-22 DIAGNOSIS — D6851 Activated protein C resistance: Secondary | ICD-10-CM | POA: Insufficient documentation

## 2018-05-22 DIAGNOSIS — I10 Essential (primary) hypertension: Secondary | ICD-10-CM

## 2018-05-22 DIAGNOSIS — Z992 Dependence on renal dialysis: Secondary | ICD-10-CM | POA: Insufficient documentation

## 2018-05-22 DIAGNOSIS — I255 Ischemic cardiomyopathy: Secondary | ICD-10-CM | POA: Insufficient documentation

## 2018-05-22 DIAGNOSIS — E119 Type 2 diabetes mellitus without complications: Secondary | ICD-10-CM

## 2018-05-22 DIAGNOSIS — I5022 Chronic systolic (congestive) heart failure: Secondary | ICD-10-CM | POA: Insufficient documentation

## 2018-05-22 DIAGNOSIS — E785 Hyperlipidemia, unspecified: Secondary | ICD-10-CM | POA: Insufficient documentation

## 2018-05-22 DIAGNOSIS — Z95811 Presence of heart assist device: Secondary | ICD-10-CM

## 2018-05-22 DIAGNOSIS — Z79899 Other long term (current) drug therapy: Secondary | ICD-10-CM

## 2018-05-22 DIAGNOSIS — Z955 Presence of coronary angioplasty implant and graft: Secondary | ICD-10-CM | POA: Insufficient documentation

## 2018-05-22 DIAGNOSIS — Z5181 Encounter for therapeutic drug level monitoring: Secondary | ICD-10-CM

## 2018-05-22 NOTE — Progress Notes (Signed)
Spoke with patient. He had appt. Today in clinic (LVAD). He received instructions for Coumadin hold and will have INR check on 05/25/18. The plan is for him to go to [facility] for admit on 05/28/18 for Heparin/anticoagulation management. For Shuntogram on 05/30/18 with Dr. Carlis Abbott.

## 2018-05-22 NOTE — Progress Notes (Signed)
VAD Clinic Note  Office: [phone] 24/7 Emergency VAD Pager: [phone] Cardiology: Dr. Aundra Dubin  HPI: [name] is a 52 y.o. male with history of CAD s/p anterior MI in 2010 and NSTEMI in 3/18 with DES to pRCA and mLAD and ischemic cardiomyopathy now s/p Heartmate 3 LVAD and ESRD presents for followup of CHF/LVAD.  Patient developed low output heart failure.  He was not deemed to be a good transplant candidate.  He was admitted in 10/18 and Heartmate 3 LVAD was placed.  He had baseline CKD stage 3 likely from diabetic nephropathy and CHF.  He developed peri-op AKI and ended up on dialysis.  No renal recovery to date.  St Jude ICD has been turned back on and ramp echo was done prior to discharge in 10/18, speed increased to 5600 rpm.   He was admitted in 11/18 with nausea/vomiting and abdominal pain.  This appears to have been due to diabetic gastroparesis (extensive GI evaluation). He had atrial flutter this admission that was converted to NSR with amiodarone.  He improved considerably with scheduled Reglan and erythromycin. He was re-admitted later in 11/18 with the same symptoms, suspect diabetic gastroparesis and was again treated with erythromycin.  He was not able to start domperidone due to prolonged QT interval. He had nausea again about a week ago but was able to manage at home with Phenergan suppositories.   Admitted 1/8 -> 09/16/17 for uncontrolled vomiting in setting of diabetic gastroparesis. Improved with phenergan suppositories and given erythromycin TID for 7 days post-discharge.    He was admitted in 2/19 for AV graft placement.  While in the hospital, we turned down his speed.  Since then, he has had considerably fewer PI events on HD days.    He was admitted in 4/19 with nausea/vomiting again, probably gastroparesis flare.   He was admitted in 5/19 with nausea/vomiting, likely gastroparesis flare, treated with erythromycin course which he is finishing.   He has been  evaluated at [facility] by Dr. Derrill Kay (gastroparesis specialist).   Gastric emptying study, surprisingly, was near normal. However, electrogastrogram showed poor gastric accomodation/capacity, "bradygastria."  Frequent small meals were recommended. Also, early am nausea was thought to be possible GERD.    Admitted 6/26 - 03/19/18 for emergent VAD exchange 03/01/18 in the setting of HM3 outflow graft kink. He required CVVHD post op, but was transtioned back to [facility] without problems. Course complicated by leukocytosis and fever. CT abd/chest negative for infection and blood cultures were negative. Post op pain well controlled withraretramadol. Able to tolerate diet. Evaluated by PT/OT and HH PT recommended for discharge. Ramp echo completed prior to discharge with speed optimization. Resumed81 mg ASA + coumadin with INR goal 2-3.  He was admitted 03/23/18 with nausea/gastroparesis symptoms and marked HTN at outpatient HD.  Erythromycin was restarted and gastroparesis symptoms were controlled with fall in BP and discharge on 03/25/18.   Today he returns for VAD follow up. Overall feeling good. Denies SOB/PND/Orthopnea. Continues on HD three days a week. Dry weight 78 kg. He is 81 kg today and looks great, was 80 kg after HD yesterday. Appetite and energy level have improved. He feels like he has gained normal (non-fluid) weight. Remains nauseated for 15-30 minutes in the morning, but states he usually doesn't eat anything until close to lunch time. Taking all medication as directed. Plans for AV Fistulogram on 05/30/18   Vital Signs:  Doppler Pressure: 94 Automatc BP: 103/53 (68) HR: 79 SPO2: 100%  Weight:  180.2 lb w/o eqt Last weight: 177.2 lbs  VAD Indication: Destination Therapy   VAD interrogation & Equipment Management: Speed: 5500 Flow: 3.7 Power: 4.5 w PI: 6.1  Alarms: No clinical alarms  Events: 5-10 on non-HD day, 50+ on HD day.   Fixed speed: 5500 Low speed limit:  5200  Primary Controller: Replace back up battery in 28 months. Back up controller: Replace back up battery in 44month.  Annual Equipment Maintenance on UBC/PM was performed on 06/2017.  Denies LVAD alarms.  Denies driveline trauma, erythema or drainage.  Denies ICD shocks. Reports taking Coumadin as prescribed and adherence to anticoagulation based dietary restrictions.  Denies bright red blood per rectum or melena, no dark urine or hematuria.    Labs (11/18): hgb 10.4 => 8.7, LDH 213 Labs (08/04/17): hgb 9 Labs (1/19): HgbA1c 6.1 Labs (2/19): hgb 13.2 Labs (3/19): hgb 11.1 Labs (7/19): hgb 9.6 Labs 03/25/2018): Creatinine 4.45 Hgb 9.6 WBC 12.6  INR 2.2  PMH: 1. Chronic systolic CHF: Ischemic cardiomyopathy. St Jude ICD.  - Echo (11/17) with EF 25-30%, moderate diastolic dysfunction, mild MR, mildly dilated RV with moderately decreased systolic function, peak RV-RA gradient 48 mmHg.  - Echo (3/18) with EF 25-30%, wall motion abnormalities, normal RV size and systolic function.  - Admission 3/18 with cardiogenic shock and AKI, required milrinone gtt.  - Echo (8/18) with EF 20-25%, mild LVH, moderate LV dilation, mild MR.  - CPX (8/18) with peak VO2 12.4, VE/VCO2 slope 35, RER 1.05 => severe HR limitation.  - Heartmate 3 LVAD 10/18.  - Pump thrombosis in setting of HM3 outflow graft kink.  Pump exchange 6/19 for HM3.  2. CAD: Anterior MI 2005, had bifurcation stenting LAD/diagonal. Repeat cath 2010 with patent stents.  - NSTEMI 3/18: LHC with 99% ulcerated lesion proximal RCA, 95% mid LAD => PCI with DES to proximal RCA and mid LAD.  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  - Lower extremity venous dopplers (8/18): chronic DVT - V/Q scan (8/18): Normal, no PE.  4. Crohns disease: History of colectomy.  5. GERD 6. Diabetic gastroparesis: Admission 11/18 with abdominal pain, nausea/vomiting.  - [facility] workup 2019: Gastric emptying study near normal.  Negative breath test (no evidence for  small bowel bacterial overgrowth).  Electrogastrogram showed poor gastric accomodation/capacity, "bradygastria."  7. HTN 8. Hyperlipidemia 9. Type II diabetes  10. ESRD: Post-op LVAD surgery.  11. Carotid dopplers (8/18): mild bilateral stenosis.  12. PFTs (8/18): Mild restriction (no IDL on CT chest).  13. Lung nodules: CT chest 8/18 with small bilateral nodules.  14. PAD: ABIs with moderate bilateral disease in 8/18.  15. Atrial flutter: In 11/18 associated with nausea/vomiting from gastroparesis.   Current Outpatient Medications  Medication Sig Dispense Refill  . aspirin EC 81 MG tablet Take 1 tablet (81 mg total) by mouth daily. 90 tablet 3  . atorvastatin (LIPITOR) 40 MG tablet Take 1 tablet (40 mg total) by mouth daily at 6 PM. 30 tablet 6  . citalopram (CELEXA) 20 MG tablet Take 1 tablet (20 mg total) by mouth daily. 30 tablet 6  . docusate sodium (COLACE) 100 MG capsule Take 1 capsule (100 mg total) by mouth 2 (two) times daily as needed for mild constipation.    .Marland Kitchendronabinol (MARINOL) 2.5 MG capsule Take 1 capsule (2.5 mg total) by mouth 2 (two) times daily before lunch and supper. 60 capsule 5  . erythromycin (E-MYCIN) 250 MG tablet Take 1 tablet (250 mg total) by mouth 3 (three)  times daily. 90 tablet 4  . insulin aspart (NOVOLOG) 100 UNIT/ML injection Inject 5 Units into the skin 3 (three) times daily with meals. 1 vial 0  . insulin glargine (LANTUS) 100 UNIT/ML injection Inject 0.1 mLs (10 Units total) into the skin daily. 10 mL 5  . isosorbide-hydrALAZINE (BIDIL) 20-37.5 MG tablet Take 1.5 tablets by mouth every 8 (eight) hours. Take 1.5 tablets every 8 hours on non dialysis days. (Patient taking differently: Take 1.5 tablets by mouth See admin instructions. Take 1.5 tablets every 8 hours on non dialysis days.) 135 tablet 6  . LORazepam (ATIVAN) 0.5 MG tablet Take 1 tablet (0.5 mg total) by mouth every 12 (twelve) hours as needed (nausea). 60 tablet 2  . metoCLOPramide (REGLAN)  10 MG tablet Take 1 tablet (10 mg total) by mouth 3 (three) times daily. 90 tablet 6  . ondansetron (ZOFRAN ODT) 4 MG disintegrating tablet Take 1 tablet (4 mg total) by mouth every 6 (six) hours as needed for nausea or vomiting. 40 tablet 1  . pantoprazole (PROTONIX) 40 MG tablet Take 40 mg by mouth 2 (two) times daily.     . promethazine (PHENERGAN) 12.5 MG tablet Take 1 tablet (12.5 mg total) by mouth every 6 (six) hours as needed for nausea or vomiting. 30 tablet 0  . warfarin (COUMADIN) 5 MG tablet Take 1 tablet (5 mg total) by mouth daily at 6 PM. 45 tablet 6   No current facility-administered medications for this encounter.     Metformin and related   Review of systems complete and found to be negative unless listed in HPI.    Vitals:   05/22/18 1207 05/22/18 1208  BP: (!) 94/0 (!) 103/53  Pulse:  79  SpO2:  100%  Weight:  81.7 kg (180 lb 3.2 oz)  Height:  5' 8"  (1.727 m)    Wt Readings from Last 3 Encounters:  04/24/18 80.4 kg (177 lb 3.2 oz)  04/05/18 78.8 kg (173 lb 12.8 oz)  03/27/18 80.5 kg (177 lb 6.4 oz)   Physical Exam: General: Well appearing this am. NAD.  HEENT: Normal. Neck: Supple, JVP 7-8 cm. Carotids OK.  Cardiac:  Mechanical heart sounds with LVAD hum present.  Lungs:  CTAB, normal effort.  Abdomen:  NT, ND, no HSM. No bruits or masses. +BS  LVAD exit site: Well-healed and incorporated. Dressing dry and intact. No erythema or drainage. Stabilization device present and accurately applied. Driveline dressing changed daily per sterile technique. Extremities:  Warm and dry. No cyanosis, clubbing, rash, or edema. RUE AVF. Not very loud.  Neuro:  Alert & oriented x 3. Cranial nerves grossly intact. Moves all 4 extremities w/o difficulty. Affect pleasant     ASSESSMENT AND PLAN: 1. Chronic systolic CHF: Ischemic cardiomyopathy.   - St Jude ICD.  NSTEMI in March 2018 with DES to LAD and RCA, complicated by cardiogenic shock, low output requiring milrinone.  Echo  8/18 with EF 20-25%, moderate MR.  CPX (8/18) with severe functional impairment due to HF.  He is s/p HM3 LVAD placement. He had pump thrombosis 6/19 due to [facility] outflow graft kinking.  He had pump exchange for another HM3.  - Old driveline site filling in. Still has occasional slight drainage.  - VAD interrogated personally. Parameters stable.   - Volume managed per HD.   - Continue Bidil 1.5 tab tid on non-HD days.    - Continue ASA 81.  Continue warfarin INR 2-3.    - He has  been seen at [facility] for evaluation for heart/kidney transplant.  2. ESRD:  - Developed peri-operatively after HM3 placement.   -Tolerating HD.  - AV fistula in his arm is working currently, but there are concerns for decreased flow. Plan for AV fistulagram 05/30/18. Will need to be admitted prior for HD and heparin bridge.  3. CAD: NSTEMI in 3/18.  - LHC with 99% ulcerated lesion proximal RCA with left to right collaterals, 95% mid LAD stenosis after mid LAD stent. s/p PCI to RCA and LAD on 11/25/16.   - No s/s of ischemia.    - Continue statin and ASA. 4. h/o DVTs: Factor V Leiden heterozygote. - On coumadin. No change.  5. Diabetic gastroparesis:  - Difficult to manage. He has seen gastroparesis specialist at [facility]. Surprisingly, gastric emptying study was normal.  However, electrogastrogram showed poor gastric accomodation/capacity.  Therefore, small frequent meals recommended.   - He has been doing much better. Continues to have nausea in the am, but he is not eating until nearly lunch time. Encouraged small meal/snack in the am.  - Continue reglan 5 mg tid/ac and Marinol.  - Continue Protonix bid.  - Continue eythromycin indefinitely.   6. DM:  - HgbA1c well-controlled recently.  Stable. 7. HTN:  - Stable.    8. PAD:  - Moderate bilateral disease on ABIs in 8/18.  Denies claudication currently, no pedal ulcers. No change.  9. Atrial flutter: Paroxysmal.   - NSR recently.  10. Difficult IV access:  - Will  need to discuss during upcoming admission. Has to have a central line for labs/IV access when he comes to hospital. Hold off porta cath for now. Will ask dialysis center to collect labs. If he has another hospital admit will consider [facility], however if his AVF fails, only location left for HD access would be R chest.   Doing well. Plan admit 05/28/18 for AV fistulagram with heparin bridge. Will check home INR Friday for coumadin plan.   Shirley Friar, PA-C  05/22/2018   Greater than 50% of the 50 minute visit was spent in counseling/coordination of care regarding disease state education, salt/fluid restriction, sliding scale diuretics, and medication compliance.

## 2018-05-22 NOTE — Progress Notes (Signed)
Patient presents for hospital  follow up in [facility] today with daughter. Reports no problems with VAD equipment or concerns with drive line.   He is using cane today and ambulating on his own. Reports his overall stamina is improving. Says his appetite has improved and is fair to good.  Denies any major N&V; remains on erythromycin 250 mg tid since last hospitalization 03/23/18. He also is taking his marinol and reglan as ordered and Ativan one time daily. He denies holding any meds, including warfarin. He does report some nausea every am "when I wake up". He takes meds on empty stomach and doesn't eat first meal until 12 noon. Encouraged to eat small amount food before taking am meds in order to help reduce nausea.    He reports he is handling dialysis "ok" but feels his dry weight at 78 kg is a little low. Feels his weight is slowly increasing due to increased appetite/eating. He reports removal of 3 - 3.6  liters each session. His wt pre dialysis yesterday was 83 kg and with post dialysis wt of 80 kg.   Pt reports sluggish flow from his fistula, saw Dr. Monica Martinez at [facility] on 05/15/18 with recommendation for right upper extremity fistulogram. Procedure scheduled next Wednesday 05/30/18, will admit to Dr. Claris Gladden service for heparin bridge on Monday.   Vital Signs:  Doppler Pressure  94 Automatc BP:  103/53 (69) HR:  74 SPO2: 100%  Weight: 180.2 lb w/o eqt Last weight: 177.2 lb   VAD Indication: Destination therapy- Pt is scheulued for transplant eval at [facility]   VAD interrogation & Equipment Management: Speed:5500 Flow: 3.7 Power: 4.5w    PI: 6.1  Alarms: none Events: 8 on Sun; 60 on Mon (dialysis day)  Fixed speed 5500 Low speed limit: 5200  Primary Controller:  Replace back up battery in 28 months. Back up controller:   Replace back up battery in 28 months.  Annual Equipment Maintenance on UBC/PM was performed on 06/2017.   I reviewed the  LVAD parameters from today and compared the results to the patient's prior recorded data. LVAD interrogation was NEGATIVE for significant power changes, NEGATIVE for clinical alarms and STABLE for PI events/speed drops. No programming changes were made and pump is functioning within specified parameters. Pt is performing daily controller and system monitor self tests along with completing weekly and monthly maintenance for LVAD equipment.  LVAD equipment check completed and is in good working order. Back-up equipment present.    Exit Site Care: Drive line is being maintained every other day by his daughter. Gauze dressing dry and intact; anchor intact and accurately applied on . Existing VAD dressing removed and site care performed using sterile technique. Drive line exit site cleaned with Chlora prep applicators x 2, allowed to dry, and gauzedressing with silver stripre-applied. Exit site healingand unincorporated, the velour is fully implanted at exit site.Smallamount of serousdrainage with no redness, tenderness, foul odor or rash noted.Drive line anchorsreplacedx 2.   Previous drive ine site dressing and packing removed. Small amount of serosanguinous drainage noted on packing. Using sterile technique, site was cleaned with betadine, and repacked with Iodaform packing. Site much improved; able to pack only 2 inches of 0.5 iodoform in site. Site covered with sterile gauze. No redness, tenderness, foul odor, or rash noted.  Will advance to every other day. If drainage noted on either site, instructed to change daily in order to keep dry and intact. Pt verbalized understanding of same.  Significant Events on VAD Support: -10/2016>> poss drive line infection, CT ABD neg, ID consult-doxy -11/09/16>> admit for poss drive line infection, IV abx -03/24/17>> doxy for poss drive line infection -05/04/17>> drive line debridement with wound-vac -06/2017>> drive line +proteus, IV abx, Bactrim x14  days  Device:St jude Dual Therapies: on 200 bpm Last check: 01/01/18 - 2 hrs Afl/Afib; EMI 04/02/18  BP & Labs:  MAP  - Doppler is reflecting MAP  Hgb not done today; difficult venous access - No S/S of bleeding. Specifically denies melena/BRBPR or nosebleeds.  LDH not done today, difficult venous access - established baseline of 245- 350. Denies tea-colored urine. No power elevations noted on interrogation.    Patient Instructions:  1. No change in meds. 2. PharmD will contact you after you complete home INR today. Will plan on repeat INR on Friday 05/25/18 prior to admission Monday for heparin bridge. Spoke with Lysbeth Galas, PharmD re: plan. 3. Admissions will call you Monday when bed available.     Zada Girt RN [place] Coordinator   Office: [phone] 24/7 Emergency VAD Pager: [phone]

## 2018-05-23 ENCOUNTER — Encounter: Payer: Self-pay | Admitting: *Deleted

## 2018-05-23 ENCOUNTER — Inpatient Hospital Stay (HOSPITAL_COMMUNITY)
Admission: AD | Admit: 2018-05-23 | Discharge: 2018-06-05 | DRG: 252 | Disposition: A | Discharge status: Home / Self Care | Payer: BLUE CROSS/BLUE SHIELD | Source: Ambulatory Visit | Attending: Cardiology | Admitting: Cardiology

## 2018-05-23 ENCOUNTER — Inpatient Hospital Stay (HOSPITAL_COMMUNITY): Payer: BLUE CROSS/BLUE SHIELD

## 2018-05-23 ENCOUNTER — Other Ambulatory Visit: Payer: Self-pay

## 2018-05-23 ENCOUNTER — Telehealth: Payer: Self-pay | Admitting: *Deleted

## 2018-05-23 ENCOUNTER — Ambulatory Visit (HOSPITAL_COMMUNITY): Payer: Self-pay

## 2018-05-23 ENCOUNTER — Encounter (HOSPITAL_COMMUNITY): Payer: Self-pay | Admitting: *Deleted

## 2018-05-23 DIAGNOSIS — Z9049 Acquired absence of other specified parts of digestive tract: Secondary | ICD-10-CM

## 2018-05-23 DIAGNOSIS — Z95810 Presence of automatic (implantable) cardiac defibrillator: Secondary | ICD-10-CM | POA: Diagnosis not present

## 2018-05-23 DIAGNOSIS — Z95811 Presence of heart assist device: Secondary | ICD-10-CM | POA: Diagnosis not present

## 2018-05-23 DIAGNOSIS — Z452 Encounter for adjustment and management of vascular access device: Secondary | ICD-10-CM

## 2018-05-23 DIAGNOSIS — Z992 Dependence on renal dialysis: Secondary | ICD-10-CM

## 2018-05-23 DIAGNOSIS — I2510 Atherosclerotic heart disease of native coronary artery without angina pectoris: Secondary | ICD-10-CM | POA: Diagnosis present

## 2018-05-23 DIAGNOSIS — E875 Hyperkalemia: Secondary | ICD-10-CM | POA: Diagnosis present

## 2018-05-23 DIAGNOSIS — K3184 Gastroparesis: Secondary | ICD-10-CM | POA: Diagnosis present

## 2018-05-23 DIAGNOSIS — Z7982 Long term (current) use of aspirin: Secondary | ICD-10-CM

## 2018-05-23 DIAGNOSIS — Z01810 Encounter for preprocedural cardiovascular examination: Secondary | ICD-10-CM | POA: Diagnosis not present

## 2018-05-23 DIAGNOSIS — Z4901 Encounter for fitting and adjustment of extracorporeal dialysis catheter: Secondary | ICD-10-CM

## 2018-05-23 DIAGNOSIS — T82818A Embolism of vascular prosthetic devices, implants and grafts, initial encounter: Secondary | ICD-10-CM | POA: Diagnosis present

## 2018-05-23 DIAGNOSIS — E785 Hyperlipidemia, unspecified: Secondary | ICD-10-CM | POA: Diagnosis present

## 2018-05-23 DIAGNOSIS — D631 Anemia in chronic kidney disease: Secondary | ICD-10-CM | POA: Diagnosis present

## 2018-05-23 DIAGNOSIS — Z955 Presence of coronary angioplasty implant and graft: Secondary | ICD-10-CM | POA: Diagnosis not present

## 2018-05-23 DIAGNOSIS — Y832 Surgical operation with anastomosis, bypass or graft as the cause of abnormal reaction of the patient, or of later complication, without mention of misadventure at the time of the procedure: Secondary | ICD-10-CM | POA: Diagnosis present

## 2018-05-23 DIAGNOSIS — D500 Iron deficiency anemia secondary to blood loss (chronic): Secondary | ICD-10-CM | POA: Diagnosis present

## 2018-05-23 DIAGNOSIS — E1122 Type 2 diabetes mellitus with diabetic chronic kidney disease: Secondary | ICD-10-CM | POA: Diagnosis present

## 2018-05-23 DIAGNOSIS — I255 Ischemic cardiomyopathy: Secondary | ICD-10-CM | POA: Diagnosis present

## 2018-05-23 DIAGNOSIS — R509 Fever, unspecified: Secondary | ICD-10-CM

## 2018-05-23 DIAGNOSIS — I5022 Chronic systolic (congestive) heart failure: Secondary | ICD-10-CM | POA: Diagnosis present

## 2018-05-23 DIAGNOSIS — K219 Gastro-esophageal reflux disease without esophagitis: Secondary | ICD-10-CM | POA: Diagnosis present

## 2018-05-23 DIAGNOSIS — N186 End stage renal disease: Secondary | ICD-10-CM | POA: Diagnosis not present

## 2018-05-23 DIAGNOSIS — E1121 Type 2 diabetes mellitus with diabetic nephropathy: Secondary | ICD-10-CM | POA: Diagnosis present

## 2018-05-23 DIAGNOSIS — Z7901 Long term (current) use of anticoagulants: Secondary | ICD-10-CM

## 2018-05-23 DIAGNOSIS — Z86718 Personal history of other venous thrombosis and embolism: Secondary | ICD-10-CM

## 2018-05-23 DIAGNOSIS — K5090 Crohn's disease, unspecified, without complications: Secondary | ICD-10-CM | POA: Diagnosis present

## 2018-05-23 DIAGNOSIS — Z794 Long term (current) use of insulin: Secondary | ICD-10-CM

## 2018-05-23 DIAGNOSIS — D6851 Activated protein C resistance: Secondary | ICD-10-CM | POA: Diagnosis present

## 2018-05-23 DIAGNOSIS — I639 Cerebral infarction, unspecified: Secondary | ICD-10-CM

## 2018-05-23 DIAGNOSIS — Z23 Encounter for immunization: Secondary | ICD-10-CM

## 2018-05-23 DIAGNOSIS — T82868A Thrombosis of vascular prosthetic devices, implants and grafts, initial encounter: Secondary | ICD-10-CM

## 2018-05-23 DIAGNOSIS — I509 Heart failure, unspecified: Secondary | ICD-10-CM

## 2018-05-23 DIAGNOSIS — N2581 Secondary hyperparathyroidism of renal origin: Secondary | ICD-10-CM | POA: Diagnosis present

## 2018-05-23 DIAGNOSIS — I132 Hypertensive heart and chronic kidney disease with heart failure and with stage 5 chronic kidney disease, or end stage renal disease: Secondary | ICD-10-CM | POA: Diagnosis present

## 2018-05-23 DIAGNOSIS — Z789 Other specified health status: Secondary | ICD-10-CM

## 2018-05-23 DIAGNOSIS — E1143 Type 2 diabetes mellitus with diabetic autonomic (poly)neuropathy: Secondary | ICD-10-CM | POA: Diagnosis present

## 2018-05-23 DIAGNOSIS — I4892 Unspecified atrial flutter: Secondary | ICD-10-CM | POA: Diagnosis present

## 2018-05-23 DIAGNOSIS — I252 Old myocardial infarction: Secondary | ICD-10-CM

## 2018-05-23 DIAGNOSIS — Z79899 Other long term (current) drug therapy: Secondary | ICD-10-CM

## 2018-05-23 DIAGNOSIS — Z888 Allergy status to other drugs, medicaments and biological substances status: Secondary | ICD-10-CM

## 2018-05-23 LAB — COMPREHENSIVE METABOLIC PANEL
ALBUMIN: 3.3 g/dL — AB (ref 3.5–5.0)
ALT: 40 U/L (ref 0–44)
ANION GAP: 13 (ref 5–15)
AST: 29 U/L (ref 15–41)
Alkaline Phosphatase: 119 U/L (ref 38–126)
BILIRUBIN TOTAL: 0.9 mg/dL (ref 0.3–1.2)
BUN: 64 mg/dL — ABNORMAL HIGH (ref 6–20)
CHLORIDE: 106 mmol/L (ref 98–111)
CO2: 21 mmol/L — ABNORMAL LOW (ref 22–32)
Calcium: 9.1 mg/dL (ref 8.9–10.3)
Creatinine, Ser: 8.95 mg/dL — ABNORMAL HIGH (ref 0.61–1.24)
GFR calc Af Amer: 7 mL/min — ABNORMAL LOW (ref >60)
GFR, EST NON AFRICAN AMERICAN: 6 mL/min — AB (ref >60)
Glucose, Bld: 123 mg/dL — ABNORMAL HIGH (ref 70–99)
POTASSIUM: 6.1 mmol/L — AB (ref 3.5–5.1)
Sodium: 140 mmol/L (ref 135–145)
TOTAL PROTEIN: 7.1 g/dL (ref 6.5–8.1)

## 2018-05-23 LAB — LACTATE DEHYDROGENASE: LDH: 206 U/L — AB (ref 98–192)

## 2018-05-23 LAB — MRSA PCR SCREENING: MRSA BY PCR: NEGATIVE

## 2018-05-23 LAB — GLUCOSE, CAPILLARY
GLUCOSE-CAPILLARY: 134 mg/dL — AB (ref 70–99)
Glucose-Capillary: 114 mg/dL — ABNORMAL HIGH (ref 70–99)

## 2018-05-23 LAB — PROTIME-INR
INR: 1.72
PROTHROMBIN TIME: 20 s — AB (ref 11.4–15.2)

## 2018-05-23 MED ORDER — INFLUENZA VAC SPLIT QUAD 0.5 ML IM SUSY
0.5000 mL | PREFILLED_SYRINGE | INTRAMUSCULAR | Status: AC
  Administered 2018-06-05: 0.5 mL via INTRAMUSCULAR

## 2018-05-23 MED ORDER — LORAZEPAM 0.5 MG PO TABS
0.5000 mg | ORAL_TABLET | Freq: Two times a day (BID) | ORAL | Status: DC | PRN
  Administered 2018-05-24 – 2018-05-28 (×4): 0.5 mg via ORAL
  Filled 2018-05-23 (×6): qty 1

## 2018-05-23 MED ORDER — METOCLOPRAMIDE HCL 10 MG PO TABS
10.0000 mg | ORAL_TABLET | Freq: Three times a day (TID) | ORAL | Status: DC
  Administered 2018-05-23 – 2018-06-05 (×36): 10 mg via ORAL
  Filled 2018-05-23 (×36): qty 1

## 2018-05-23 MED ORDER — DRONABINOL 2.5 MG PO CAPS
2.5000 mg | ORAL_CAPSULE | Freq: Two times a day (BID) | ORAL | Status: DC
  Administered 2018-05-23 – 2018-06-05 (×23): 2.5 mg via ORAL
  Filled 2018-05-23 (×23): qty 1

## 2018-05-23 MED ORDER — PROMETHAZINE HCL 25 MG PO TABS
12.5000 mg | ORAL_TABLET | Freq: Four times a day (QID) | ORAL | Status: DC | PRN
  Administered 2018-05-24 – 2018-05-25 (×3): 12.5 mg via ORAL
  Filled 2018-05-23 (×5): qty 1

## 2018-05-23 MED ORDER — INSULIN GLARGINE 100 UNIT/ML ~~LOC~~ SOLN
10.0000 [IU] | Freq: Every day | SUBCUTANEOUS | Status: DC
  Administered 2018-05-25 – 2018-06-05 (×9): 10 [IU] via SUBCUTANEOUS
  Filled 2018-05-23 (×13): qty 0.1

## 2018-05-23 MED ORDER — ACETAMINOPHEN 325 MG PO TABS
650.0000 mg | ORAL_TABLET | ORAL | Status: DC | PRN
  Administered 2018-05-26 – 2018-05-27 (×2): 650 mg via ORAL
  Filled 2018-05-23 (×2): qty 2

## 2018-05-23 MED ORDER — PANTOPRAZOLE SODIUM 40 MG PO TBEC
40.0000 mg | DELAYED_RELEASE_TABLET | Freq: Two times a day (BID) | ORAL | Status: DC
  Administered 2018-05-23 – 2018-06-05 (×25): 40 mg via ORAL
  Filled 2018-05-23 (×26): qty 1

## 2018-05-23 MED ORDER — DOCUSATE SODIUM 100 MG PO CAPS: 100.0000 mg | ORAL_CAPSULE | Freq: Two times a day (BID) | ORAL | Status: DC | PRN

## 2018-05-23 MED ORDER — ONDANSETRON HCL 4 MG/2ML IJ SOLN
4.0000 mg | Freq: Four times a day (QID) | INTRAMUSCULAR | Status: DC | PRN
  Administered 2018-05-24 – 2018-05-28 (×2): 4 mg via INTRAVENOUS
  Filled 2018-05-23 (×2): qty 2

## 2018-05-23 MED ORDER — FENTANYL CITRATE (PF) 100 MCG/2ML IJ SOLN
INTRAMUSCULAR | Status: AC
  Filled 2018-05-23: qty 2

## 2018-05-23 MED ORDER — HEPARIN (PORCINE) IN NACL 100-0.45 UNIT/ML-% IJ SOLN
1200.0000 [IU]/h | INTRAMUSCULAR | Status: DC
  Administered 2018-05-23: 1050 [IU]/h via INTRAVENOUS
  Filled 2018-05-23 (×4): qty 250

## 2018-05-23 MED ORDER — ISOSORB DINITRATE-HYDRALAZINE 20-37.5 MG PO TABS
1.5000 | ORAL_TABLET | ORAL | Status: DC
  Administered 2018-05-24 – 2018-06-05 (×20): 1.5 via ORAL
  Filled 2018-05-23 (×15): qty 2
  Filled 2018-05-23: qty 1.5
  Filled 2018-05-23 (×2): qty 2
  Filled 2018-05-23: qty 1.5
  Filled 2018-05-23 (×3): qty 2

## 2018-05-23 MED ORDER — CITALOPRAM HYDROBROMIDE 20 MG PO TABS
20.0000 mg | ORAL_TABLET | Freq: Every day | ORAL | Status: DC
  Administered 2018-05-23 – 2018-06-05 (×13): 20 mg via ORAL
  Filled 2018-05-23 (×13): qty 1

## 2018-05-23 MED ORDER — ERYTHROMYCIN BASE 250 MG PO TABS
250.0000 mg | ORAL_TABLET | Freq: Three times a day (TID) | ORAL | Status: DC
  Administered 2018-05-23 – 2018-06-05 (×34): 250 mg via ORAL
  Filled 2018-05-23 (×35): qty 1

## 2018-05-23 MED ORDER — INSULIN ASPART 100 UNIT/ML ~~LOC~~ SOLN
5.0000 [IU] | Freq: Three times a day (TID) | SUBCUTANEOUS | Status: DC
  Administered 2018-05-23 – 2018-06-05 (×23): 5 [IU] via SUBCUTANEOUS

## 2018-05-23 MED ORDER — CHLORHEXIDINE GLUCONATE CLOTH 2 % EX PADS
6.0000 | MEDICATED_PAD | Freq: Every day | CUTANEOUS | Status: DC
  Administered 2018-05-25 – 2018-06-05 (×9): 6 via TOPICAL

## 2018-05-23 MED ORDER — ASPIRIN EC 81 MG PO TBEC
81.0000 mg | DELAYED_RELEASE_TABLET | Freq: Every day | ORAL | Status: DC
  Administered 2018-05-24 – 2018-06-05 (×12): 81 mg via ORAL
  Filled 2018-05-23 (×12): qty 1

## 2018-05-23 MED ORDER — MIDAZOLAM HCL 2 MG/2ML IJ SOLN
INTRAMUSCULAR | Status: AC
  Filled 2018-05-23: qty 2

## 2018-05-23 MED ORDER — ATORVASTATIN CALCIUM 40 MG PO TABS
40.0000 mg | ORAL_TABLET | Freq: Every day | ORAL | Status: DC
  Administered 2018-05-23 – 2018-06-04 (×13): 40 mg via ORAL
  Filled 2018-05-23 (×13): qty 1

## 2018-05-23 NOTE — Plan of Care (Signed)

## 2018-05-23 NOTE — H&P (Addendum)
Advanced Heart Failure VAD History and Physical Note   PCP-Cardiologist: No primary care provider on file.   Reason for Admission: Renal Failure   HPI:    [name] is a 52 y.o. male with history of CAD s/p anterior MI in 2010 and NSTEMI in 3/18 with DES to pRCA and mLAD and ischemic cardiomyopathy now s/p Heartmate 3 LVAD and ESRD  He was admitted in 10/18 and Heartmate 3 LVAD was placed.  He had baseline CKD stage 3 likely from diabetic nephropathy and CHF.  He developed peri-op AKI and ended up on dialysis.  No renal recovery to date.  He has had ongoing issues with gastroparesis. He has been evaluated at [facility] by Dr. Derrill Kay (gastroparesis specialist).   Gastric emptying study, surprisingly, was near normal. However, electrogastrogram showed poor gastric accomodation/capacity, "bradygastria."  Frequent small meals were recommended. Also, early am nausea was thought to be possible GERD.    He underwent AV graft 10/2017. Earlier today he was unable to receive dialysis due malfunctioning dialysis catheter. The HD nurse reported >300 cc of blood loss trying access graft . Dr Deterding recommended admit today.   INR at home today 1.8.   He has been followed by [facility] with plans for fistulagram on 9/23 with heparin bridge.   Today he is feeling ok. Denies SOB. Denies bleeding issues.   LVAD INTERROGATION:  HeartMate III LVAD:  Flow 3.6  liters/min, speed 5600, power 4.4 , PI 4.7      Review of Systems: [y] = yes, [ ]  = no   General: Weight gain [ ] ; Weight loss [ ] ; Anorexia [ ] ; Fatigue [ ] ; Fever [ ] ; Chills [ ] ; Weakness [ ]   Cardiac: Chest pain/pressure [ ] ; Resting SOB [ ] ; Exertional SOB [ ] ; Orthopnea [ ] ; Pedal Edema [ ] ; Palpitations [ ] ; Syncope [ ] ; Presyncope [ ] ; Paroxysmal nocturnal dyspnea[ ]   Pulmonary: Cough [ ] ; Wheezing[ ] ; Hemoptysis[ ] ; Sputum [ ] ; Snoring [ ]   GI: Vomiting[ ] ; Dysphagia[ ] ; Melena[ ] ; Hematochezia [ ] ; Heartburn[ ] ; Abdominal pain [ ] ;  Constipation [ ] ; Diarrhea [ ] ; BRBPR [ ]   GU: Hematuria[ ] ; Dysuria [ ] ; Nocturia[ ]   Vascular: Pain in legs with walking [ ] ; Pain in feet with lying flat [ ] ; Non-healing sores [ ] ; Stroke [ ] ; TIA [ ] ; Slurred speech [ ] ;  Neuro: Headaches[ ] ; Vertigo[ ] ; Seizures[ ] ; Paresthesias[ ] ;Blurred vision [ ] ; Diplopia [ ] ; Vision changes [ ]   Ortho/Skin: Arthritis [ ] ; Joint pain [Y ]; Muscle pain [ ] ; Joint swelling [ ] ; Back Pain [ Y]; Rash [ ]   Psych: Depression[ ] ; Anxiety[ ]   Heme: Bleeding problems [ ] ; Clotting disorders [ ] ; Anemia [ ]   Endocrine: Diabetes [ Y]; Thyroid dysfunction[ ]     Home Medications Prior to Admission medications   Medication Sig Start Date End Date Taking? Authorizing Provider  aspirin EC 81 MG tablet Take 1 tablet (81 mg total) by mouth daily. 07/26/17   Larey Dresser, MD  atorvastatin (LIPITOR) 40 MG tablet Take 1 tablet (40 mg total) by mouth daily at 6 PM. 01/25/18   [name], MD  citalopram (CELEXA) 20 MG tablet Take 1 tablet (20 mg total) by mouth daily. 03/19/18   Georgiana Shore, NP  docusate sodium (COLACE) 100 MG capsule Take 1 capsule (100 mg total) by mouth 2 (two) times daily as needed for mild constipation. 03/25/18  Isaiah Serge, NP  dronabinol (MARINOL) 2.5 MG capsule Take 1 capsule (2.5 mg total) by mouth 2 (two) times daily before lunch and supper. 03/27/18   Larey Dresser, MD  erythromycin (E-MYCIN) 250 MG tablet Take 1 tablet (250 mg total) by mouth 3 (three) times daily. 03/25/18   Isaiah Serge, NP  insulin aspart (NOVOLOG) 100 UNIT/ML injection Inject 5 Units into the skin 3 (three) times daily with meals. 01/22/18   Georgiana Shore, NP  insulin glargine (LANTUS) 100 UNIT/ML injection Inject 0.1 mLs (10 Units total) into the skin daily. 05/09/18   Lovenia Kim, MD  isosorbide-hydrALAZINE (BIDIL) 20-37.5 MG tablet Take 1.5 tablets by mouth every 8 (eight) hours. Take 1.5 tablets every 8 hours on non dialysis days. Patient  taking differently: Take 1.5 tablets by mouth See admin instructions. Take 1.5 tablets every 8 hours on non dialysis days. 03/19/18   Georgiana Shore, NP  LORazepam (ATIVAN) 0.5 MG tablet Take 1 tablet (0.5 mg total) by mouth every 12 (twelve) hours as needed (nausea). 03/27/18   Larey Dresser, MD  metoCLOPramide (REGLAN) 10 MG tablet Take 1 tablet (10 mg total) by mouth 3 (three) times daily. 01/22/18   Georgiana Shore, NP  ondansetron (ZOFRAN ODT) 4 MG disintegrating tablet Take 1 tablet (4 mg total) by mouth every 6 (six) hours as needed for nausea or vomiting. Patient not taking: Reported on 05/22/2018 01/10/17   Esterwood, Amy S, PA-C  pantoprazole (PROTONIX) 40 MG tablet Take 40 mg by mouth 2 (two) times daily.     [provider]  promethazine (PHENERGAN) 12.5 MG tablet Take 1 tablet (12.5 mg total) by mouth every 6 (six) hours as needed for nausea or vomiting. 09/16/17   Daune Perch, NP  warfarin (COUMADIN) 5 MG tablet Take 1 tablet (5 mg total) by mouth daily at 6 PM. 03/25/18   Isaiah Serge, NP    Past Medical History: Past Medical History:  Diagnosis Date  . Angina   . ASCVD (arteriosclerotic cardiovascular disease)    , Anterior infarction 2005, LAD diagonal bifurcation intervention 03/2004  . Automatic implantable cardiac defibrillator -St. Jude's       . Benign neoplasm of colon   . CHF (congestive heart failure) ([place])   . Chronic systolic heart failure ([place])   . Coronary artery disease     Widely patent previously placed stents in the left anterior   . Crohn's disease ([place])   . 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 Dialysis patient ([place])   . Dyspnea   . Gastroparesis   . GERD (gastroesophageal reflux disease)   . High cholesterol   . Hyperlipidemia   . Hypersomnolent    Previous diagnosis of narcolepsy  . Hypertension, essential   . Ischemic cardiomyopathy    Ejection fraction 15-20% catheterization 2010  . Type II or unspecified type  diabetes mellitus without mention of complication, not stated as uncontrolled   . Unspecified gastritis and gastroduodenitis without mention of hemorrhage     Past Surgical History: Past Surgical History:  Procedure Laterality Date  . APPENDECTOMY    . AV FISTULA PLACEMENT Right 06/26/2017   Procedure: ARTERIOVENOUS (AV) FISTULA CREATION VERSUS GRAFT RIGHT  ARM;  Surgeon: Conrad Buffalo, MD;  Location: [place];  Service: Vascular;  Laterality: Right;  . AV FISTULA PLACEMENT Right 10/31/2017   Procedure: INSERTION OF ARTERIOVENOUS (AV) ARTEGRAFT,  RIGHT UPPER ARM;  Surgeon: Conrad Hawkins,  MD;  Location: [place];  Service: Vascular;  Laterality: Right;  . CARDIAC DEFIBRILLATOR PLACEMENT  2010   St. Jude ICD  . COLECTOMY  ~ 2003   "for Crohn's" ascending colon.    . CORONARY STENT INTERVENTION N/A 11/25/2016   Procedure: Coronary Stent Intervention;  Surgeon: Leonie Man, MD;  Location: [place] [facility];  Service: Cardiovascular;  Laterality: N/A;  . EP IMPLANTABLE DEVICE N/A 09/14/2016   Procedure: ICD Generator Changeout;  Surgeon: Deboraha Sprang, MD;  Location: [place] [facility];  Service: Cardiovascular;  Laterality: N/A;  . ESOPHAGOGASTRODUODENOSCOPY N/A 07/12/2017   Procedure: ESOPHAGOGASTRODUODENOSCOPY (EGD);  Surgeon: Jerene Bears, MD;  [facility];  Service: Gastroenterology;  Laterality: N/A;  VAD pt so VAD team will need to accompany pt.   [name] FETAL SURGERY FOR CONGENITAL HERNIA     ????? pt has no knowledge of this..    . INGUINAL HERNIA REPAIR    . INSERTION OF DIALYSIS CATHETER Right 06/26/2017   Procedure: INSERTION OF TUNNELED  DIALYSIS CATHETER RIGHT INTERNAL JUGULAR;  Surgeon: Conrad Ethan, MD;  Location: [place];  Service: Vascular;  Laterality: Right;  . INSERTION OF IMPLANTABLE LEFT VENTRICULAR ASSIST DEVICE N/A 06/12/2017   Procedure: INSERTION OF IMPLANTABLE LEFT VENTRICULAR ASSIST [place] 3;  Surgeon: Ivin Poot, MD;  Location: [place];  Service:  Open Heart Surgery;  Laterality: N/A;  HM3 LVAD  CIRC ARREST  NITRIC OXIDE  . INSERTION OF IMPLANTABLE LEFT VENTRICULAR ASSIST DEVICE N/A 02/28/2018   Procedure: REDO INSERTION OF IMPLANTABLE LEFT VENTRICULAR ASSIST DEVICE - HEARTMATE 3;  Surgeon: Ivin Poot, MD;  Location: [place];  Service: Open Heart Surgery;  Laterality: N/A;  . IR REMOVAL TUN CV CATH W/O FL  02/26/2018  . IR THORACENTESIS ASP PLEURAL SPACE W/IMG GUIDE  06/28/2017  . LIGATION OF ARTERIOVENOUS  FISTULA Right 10/31/2017   Procedure: LIGATION OF BRACHIO-BASILIC VEIN TRANSPOSITION RIGHT ARM;  Surgeon: Conrad Pittsburg, MD;  Location: [place];  Service: Vascular;  Laterality: Right;  . MULTIPLE EXTRACTIONS WITH ALVEOLOPLASTY N/A 06/08/2017   Procedure: MULTIPLE EXTRACTION WITH ALVEOLOPLASTY AND PRE PROSTHETIC SURGERY AS NEEDED;  Surgeon: Lenn Cal, DDS;  Location: [place];  Service: Oral Surgery;  Laterality: N/A;  . RIGHT HEART CATH N/A 06/07/2017   Procedure: RIGHT HEART CATH;  Surgeon: Larey Dresser, MD;  Location: [place] [facility];  Service: Cardiovascular;  Laterality: N/A;  . RIGHT HEART CATH N/A 02/08/2018   Procedure: RIGHT HEART CATH;  Surgeon: Larey Dresser, MD;  Location: [place] [facility];  Service: Cardiovascular;  Laterality: N/A;  . RIGHT/LEFT HEART CATH AND CORONARY ANGIOGRAPHY N/A 11/23/2016   Procedure: Right/Left Heart Cath and Coronary Angiography;  Surgeon: Larey Dresser, MD;  Location: [place] [facility];  Service: Cardiovascular;  Laterality: N/A;  . TEE WITHOUT CARDIOVERSION N/A 06/12/2017   Procedure: TRANSESOPHAGEAL ECHOCARDIOGRAM (TEE);  Surgeon: Prescott Gum, Collier Salina, MD;  Location: [place];  Service: Open Heart Surgery;  Laterality: N/A;  . TEE WITHOUT CARDIOVERSION N/A 02/28/2018   Procedure: TRANSESOPHAGEAL ECHOCARDIOGRAM (TEE);  Surgeon: Prescott Gum, Collier Salina, MD;  Location: [place];  Service: Open Heart Surgery;  Laterality: N/A;  . THROMBECTOMY AND REVISION OF ARTERIOVENTOUS (AV) GORETEX  GRAFT Right  12/12/2017   Procedure: THROMBECTOMY ARTERIOVENTOUS (AV) GORETEX  GRAFT RIGHT UPPER ARM;  Surgeon: Conrad , MD;  Location: [facility] OR;  Service: Vascular;  Laterality: Right;    Family History: Family History  Problem  Relation Age of Onset  . Colon polyps Mother   . Diabetes Mother   . Heart attack Father   . Diabetes Father   . Stroke Paternal Grandfather   . Colitis Sister   . Heart disease Brother   . Colitis Sister   . Colon cancer Neg Hx     Social History: Social History   Socioeconomic History  . Marital status: Married    Spouse name: Not on file  . Number of children: Not on file  . Years of education: Not on file  . Highest education level: Not on file  Occupational History  . Not on file  Social Needs  . Financial resource strain: Not on file  . Food insecurity:    Worry: Not on file    Inability: Not on file  . Transportation needs:    Medical: Not on file    Non-medical: Not on file  Tobacco Use  . Smoking status: Never Smoker  . Smokeless tobacco: Never Used  Substance and Sexual Activity  . Alcohol use: No  . Drug use: No  . Sexual activity: Never  Lifestyle  . Physical activity:    Days per week: Not on file    Minutes per session: Not on file  . Stress: Not on file  Relationships  . Social connections:    Talks on phone: Not on file    Gets together: Not on file    Attends religious service: Not on file    Active member of club or organization: Not on file    Attends meetings of clubs or organizations: Not on file    Relationship status: Not on file  Other Topics Concern  . Not on file  Social History Narrative  . Not on file    Allergies:  Allergies  Allergen Reactions  . Metformin And Related Diarrhea    Objective:    Vital Signs:       There were no vitals filed for this visit.  Mean arterial Pressure 110  Physical Exam    General:  Well appearing. No resp difficulty HEENT: Normal Neck: supple. JVP ~10  . Carotids 2+  bilat; no bruits. No lymphadenopathy or thyromegaly appreciated. Cor: Mechanical heart sounds with LVAD hum present. Lungs: Clear Abdomen: soft, nontender, nondistended. No hepatosplenomegaly. No bruits or masses. Good bowel sounds. Driveline: C/D/I; securement device intact and driveline incorporated Extremities: no cyanosis, clubbing, rash, edema. RUE AVF Neuro: alert & orientedx3, cranial nerves grossly intact. moves all 4 extremities w/o difficulty. Affect pleasant   Telemetry   Telemetry pending.   EKG   N/a   Labs    Basic Metabolic Panel: No results for input(s): NA, K, CL, CO2, GLUCOSE, BUN, CREATININE, CALCIUM, MG, PHOS in the last 168 hours.  Liver Function Tests: No results for input(s): AST, ALT, ALKPHOS, BILITOT, PROT, ALBUMIN in the last 168 hours. No results for input(s): LIPASE, AMYLASE in the last 168 hours. No results for input(s): AMMONIA in the last 168 hours.  CBC: No results for input(s): WBC, NEUTROABS, HGB, HCT, MCV, PLT in the last 168 hours.  Cardiac Enzymes: No results for input(s): CKTOTAL, CKMB, CKMBINDEX, TROPONINI in the last 168 hours.  BNP: BNP (last 3 results) Recent Labs    03/02/18 0400 03/08/18 0008 03/15/18 0106  BNP 694.1* 1,576.6* 811.3*    ProBNP (last 3 results) No results for input(s): PROBNP in the last 8760 hours.   CBG: No results for input(s): GLUCAP in the last 168  hours.  Coagulation Studies: No results for input(s): LABPROT, INR in the last 72 hours.  Other results: EKG:   Imaging    No results found.    Patient Profile:   [name] is a 52 y.o. male with history of CAD s/p anterior MI in 2010 and NSTEMI in 3/18 with DES to pRCA and mLAD and ischemic cardiomyopathy now s/p Heartmate 3 LVAD and ESRD  He was admitted in 10/18 and Heartmate 3 LVAD was placed.  He had baseline CKD stage 3 likely from diabetic nephropathy and CHF.  He developed peri-op AKI and ended up on dialysis.  No renal  recovery to date.  Admitted with malfunctioning AVF. Nephrology recommended admit.   Assessment/Plan:    1. ESRD--unable to use AVF at dialysis today.  He has been followed by [facility] for planned fistulagram on 9/23.  Consult nephrology 2.Chronic Systolic Heart Failure with HMIII VAD, St Jude ICD  Volume managed with dialysis.  Stable today.  Continue bidil on non dialysis days.  Continue asa 81 mg daily.  No coumadin in the event he can get vascular procedure sooner.  Once INR < 1.8 will need heparin drip.  Check INR  INR at home today was 1.8  3. HTN Follow Maps Continue bidil on non hd days.   4. h/o DVTs: Factor V Leiden heterozygote. - On coumadin. No change.  5. Diabetic gastroparesis:  - Stable for now.  - Continue reglan 5 mg tid/ac and Marinol.  - Continue Protonix bid.  - Continue eythromycin indefinitely.   6. DM:  Continue home regimen.  7. HTN:  - Stable.    8. PAD:  - Moderate bilateral disease on ABIs in 8/18.  Denies claudication currently, no pedal ulcers. No change.  9. Atrial flutter: Paroxysmal.   - NSR recently.  10. CAD  No S/S ischemia Continue statin and asa.   Will need central access.    I reviewed the LVAD parameters from today, and compared the results to the patient's prior recorded data.  No programming changes were made.  The LVAD is functioning within specified parameters.  The patient performs LVAD self-test daily.  LVAD interrogation was negative for any significant power changes, alarms or PI events/speed drops.  LVAD equipment check completed and is in good working order.  Back-up equipment present.   LVAD education done on emergency procedures and precautions and reviewed exit site care.  Length of Stay: 0  Darrick Grinder, NP 05/23/2018, 4:15 PM  VAD Team Pager [phone] (7am - 7am) +++VAD ISSUES ONLY+++   Advanced Heart Failure Team Pager [phone] (M-F; [place])  Please contact [facility] for night-coverage after hours (4p -7a )  and weekends on [email or web address] for all non- LVAD Issues]  Patient seen and examined with Darrick Grinder, NP. We discussed all aspects of the encounter. I agree with the assessment and plan as stated above.   52 y/o male with chronic systolic HF s/p LVAD placement and ESRD. Unable to get HD today due to AVF failure. Volume status slightly elevated but ok. Also struggles with severe gastroparesis but tolerating POs. INR 1.8. Hold coumadin for fistulogram. I spoke with Dr. Jonnie Finner will place central access and wait to see if they can get fistula open. If unable will need trialysis cath. VAD interrogated personally. Parameters stable.  [name], MD  4:38 PM

## 2018-05-23 NOTE — Consult Note (Signed)
[facility]  Patient name: [name] MRN: [id number] DOB: 08/11/1966 Sex: male   REQUESTING PROVIDER:    Dr. Melvia Heaps   REASON FOR CONSULT:    Dialysis access  HISTORY OF PRESENT ILLNESS:   [name] is a 52 y.o. male, who was unable to receive dialysis earlier today through his right upper arm graft and so he was admitted to the hospital.  He had been scheduled for a shuntogram next week.  The patient has a history of coronary artery disease, status post MI and PCI.  He now has a LVAD in place.  He is on a statin for hypercholesterolemia.  He is a diabetic.  He is on anticoagulation for his LVAD.  His INR today was 1.72.   He is factor V Leiden heterozygous, with history of DVT.  PAST MEDICAL HISTORY    Past Medical History:  Diagnosis Date  . Angina   . ASCVD (arteriosclerotic cardiovascular disease)    , Anterior infarction 2005, LAD diagonal bifurcation intervention 03/2004  . Automatic implantable cardiac defibrillator -St. Jude's       . Benign neoplasm of colon   . CHF (congestive heart failure) ([place])   . Chronic systolic heart failure ([place])   . Coronary artery disease     Widely patent previously placed stents in the left anterior   . Crohn's disease ([place])   . 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 Dialysis patient ([place])   . Dyspnea   . Gastroparesis   . GERD (gastroesophageal reflux disease)   . High cholesterol   . Hyperlipidemia   . Hypersomnolent    Previous diagnosis of narcolepsy  . Hypertension, essential   . Ischemic cardiomyopathy    Ejection fraction 15-20% catheterization 2010  . Type II or unspecified type diabetes mellitus without mention of complication, not stated as uncontrolled   . Unspecified gastritis and gastroduodenitis without mention of hemorrhage      FAMILY HISTORY   Family History  Problem Relation Age of Onset  . Colon polyps  Mother   . Diabetes Mother   . Heart attack Father   . Diabetes Father   . Stroke Paternal Grandfather   . Colitis Sister   . Heart disease Brother   . Colitis Sister   . Colon cancer Neg Hx     SOCIAL HISTORY:   Social History   Socioeconomic History  . Marital status: Married    Spouse name: Not on file  . Number of children: Not on file  . Years of education: Not on file  . Highest education level: Not on file  Occupational History  . Not on file  Social Needs  . Financial resource strain: Not on file  . Food insecurity:    Worry: Not on file    Inability: Not on file  . Transportation needs:    Medical: Not on file    Non-medical: Not on file  Tobacco Use  . Smoking status: Never Smoker  . Smokeless tobacco: Never Used  Substance and Sexual Activity  . Alcohol use: No  . Drug use: No  . Sexual activity: Never  Lifestyle  . Physical activity:    Days per week: Not on file    Minutes per session: Not on file  . Stress: Not on file  Relationships  . Social connections:    Talks on phone: Not on file    Gets together: Not on  file    Attends religious service: Not on file    Active member of club or organization: Not on file    Attends meetings of clubs or organizations: Not on file    Relationship status: Not on file  . Intimate partner violence:    Fear of current or ex partner: Not on file    Emotionally abused: Not on file    Physically abused: Not on file    Forced sexual activity: Not on file  Other Topics Concern  . Not on file  Social History Narrative  . Not on file    ALLERGIES:    Allergies  Allergen Reactions  . Metformin And Related Diarrhea    CURRENT MEDICATIONS:    Current Facility-Administered Medications  Medication Dose Route Frequency Provider Last Rate Last Dose  . acetaminophen (TYLENOL) tablet 650 mg  650 mg Oral Q4H PRN Clegg, Amy D, NP      . Derrill Memo ON 05/24/2018] aspirin EC tablet 81 mg  81 mg Oral Daily Clegg, Amy D,  NP      . atorvastatin (LIPITOR) tablet 40 mg  40 mg Oral q1800 Clegg, Amy D, NP   40 mg at 05/23/18 1832  . [START ON 05/24/2018] Chlorhexidine Gluconate Cloth 2 % PADS 6 each  6 each Topical Q0600 Roney Jaffe, MD      . citalopram (CELEXA) tablet 20 mg  20 mg Oral Daily Clegg, Amy D, NP   20 mg at 05/23/18 1854  . docusate sodium (COLACE) capsule 100 mg  100 mg Oral BID PRN Clegg, Amy D, NP      . dronabinol (MARINOL) capsule 2.5 mg  2.5 mg Oral BID AC Clegg, Amy D, NP   2.5 mg at 05/23/18 1832  . erythromycin (E-MYCIN) tablet 250 mg  250 mg Oral TID Clegg, Amy D, NP      . fentaNYL (SUBLIMAZE) 100 MCG/2ML injection           . [START ON 05/24/2018] Influenza vac split quadrivalent PF (FLUARIX) injection 0.5 mL  0.5 mL Intramuscular Tomorrow-1000 Larey Dresser, MD      . insulin aspart (novoLOG) injection 5 Units  5 Units Subcutaneous TID WC Clegg, Amy D, NP   5 Units at 05/23/18 1833  . [START ON 05/24/2018] insulin glargine (LANTUS) injection 10 Units  10 Units Subcutaneous Daily Clegg, Amy D, NP      . [START ON 05/24/2018] isosorbide-hydrALAZINE (BIDIL) 20-37.5 MG per tablet 1.5 tablet  1.5 tablet Oral 3 times per day on Sun Tue Thu Sat Clegg, Amy D, NP      . LORazepam (ATIVAN) tablet 0.5 mg  0.5 mg Oral Q12H PRN Clegg, Amy D, NP      . metoCLOPramide (REGLAN) tablet 10 mg  10 mg Oral TID Clegg, Amy D, NP      . midazolam (VERSED) 2 MG/2ML injection           . ondansetron (ZOFRAN) injection 4 mg  4 mg Intravenous Q6H PRN Clegg, Amy D, NP      . pantoprazole (PROTONIX) EC tablet 40 mg  40 mg Oral BID Clegg, Amy D, NP      . promethazine (PHENERGAN) tablet 12.5 mg  12.5 mg Oral Q6H PRN Clegg, Amy D, NP        REVIEW OF SYSTEMS:   [X]  denotes positive finding, [ ]  denotes negative finding Cardiac  Comments:  Chest pain or chest pressure:    Shortness of breath  upon exertion:    Short of breath when lying flat:    Irregular heart rhythm:        Vascular    Pain in calf, thigh, or  hip brought on by ambulation:    Pain in feet at night that wakes you up from your sleep:     Blood clot in your veins:    Leg swelling:         Pulmonary    Oxygen at home:    Productive cough:     Wheezing:         Neurologic    Sudden weakness in arms or legs:     Sudden numbness in arms or legs:     Sudden onset of difficulty speaking or slurred speech:    Temporary loss of vision in one eye:     Problems with dizziness:         Gastrointestinal    Blood in stool:      Vomited blood:         Genitourinary    Burning when urinating:     Blood in urine:        Psychiatric    Major depression:         Hematologic    Bleeding problems:    Problems with blood clotting too easily:        Skin    Rashes or ulcers:        Constitutional    Fever or chills:     PHYSICAL EXAM:   Vitals:   05/23/18 1606  BP: (!) 128/101  Pulse: 80  Resp: 17  Temp: 98.8 F (37.1 C)  TempSrc: Oral  SpO2: 100%  Weight: 80.7 kg  Height: 5' 8"  (1.727 m)    GENERAL: The patient is a well-nourished male, in no acute distress. The vital signs are documented above. CARDIAC: There is a regular rate and rhythm.  VASCULAR: Dialysis graft is without a thrill, however I do hear flow with hand-held Doppler. PULMONARY: Nonlabored respirations MUSCULOSKELETAL: There are no major deformities or cyanosis. NEUROLOGIC: No focal weakness or paresthesias are detected. SKIN: There are no ulcers or rashes noted. PSYCHIATRIC: The patient has a normal affect.  STUDIES:   None  ASSESSMENT and PLAN   The patient was unable to receive hemodialysis earlier today and so he was admitted.  He had previously been scheduled for a shuntogram next week.  Today, I was able to hear flow with a hand-held Doppler suggesting that the graft remains patent but likely has some central venous stenosis.  He will be scheduled for a shuntogram tomorrow.  If salvage of the graft cannot be performed tomorrow, he will need  a tunneled dialysis catheter and plans for new access.  Coumadin is on hold and he is getting heparin.   Annamarie Major, MD [facility] of [facility] [phone] Pager [phone]

## 2018-05-23 NOTE — Consult Note (Addendum)
Renal Service Consult Note [facility]  [name] 05/23/2018 [name] Requesting Physician:  Dr Aundra Dubin  Reason for Consult:  ESRD pt w/ nonfunctioning access HPI: The patient is a 52 y.o. year-old with hx of DM2, CAD/ICM sp LVAD, gastroparesis, ESRD on HD went to dialysis today where they were not able to access the RUA AV graft successfully.  Pt sent for admit to hospital.    Pt has been on HD < 1 year.  Was using a catheter but is now using RUA AVG.  The aVG was placed in Feb 2019.  In April 2019 it was declotted by Dr Bridgett Larsson, "simple" thrombectomy.  Had been working well but over the last 1-2 weeks they have noticed decline in access flow at the OP unit.  An appt was made to see VVS in the office next week.    Patient denies any SOB, CP, abd pain, n/v/d. Voids minimal amounts.     ROS  denies CP  no joint pain   no HA  no blurry vision  no rash  no diarrhea  no nausea/ vomiting    Past Medical History  Past Medical History:  Diagnosis Date  . Angina   . ASCVD (arteriosclerotic cardiovascular disease)    , Anterior infarction 2005, LAD diagonal bifurcation intervention 03/2004  . Automatic implantable cardiac defibrillator -St. Jude's       . Benign neoplasm of colon   . CHF (congestive heart failure) ([place])   . Chronic systolic heart failure ([place])   . Coronary artery disease     Widely patent previously placed stents in the left anterior   . Crohn's disease ([place])   . 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 Dialysis patient ([place])   . Dyspnea   . Gastroparesis   . GERD (gastroesophageal reflux disease)   . High cholesterol   . Hyperlipidemia   . Hypersomnolent    Previous diagnosis of narcolepsy  . Hypertension, essential   . Ischemic cardiomyopathy    Ejection fraction 15-20% catheterization 2010  . Type II or unspecified type diabetes mellitus without mention of complication, not stated as uncontrolled   .  Unspecified gastritis and gastroduodenitis without mention of hemorrhage    Past Surgical History  Past Surgical History:  Procedure Laterality Date  . APPENDECTOMY    . AV FISTULA PLACEMENT Right 06/26/2017   Procedure: ARTERIOVENOUS (AV) FISTULA CREATION VERSUS GRAFT RIGHT  ARM;  Surgeon: Conrad [place], MD;  Location: [facility] OR;  Service: Vascular;  Laterality: Right;  . AV FISTULA PLACEMENT Right 10/31/2017   Procedure: INSERTION OF ARTERIOVENOUS (AV) ARTEGRAFT,  RIGHT UPPER ARM;  Surgeon: Conrad , MD;  Location: [place];  Service: Vascular;  Laterality: Right;  . CARDIAC DEFIBRILLATOR PLACEMENT  2010   St. Jude ICD  . COLECTOMY  ~ 2003   "for Crohn's" ascending colon.    . CORONARY STENT INTERVENTION N/A 11/25/2016   Procedure: Coronary Stent Intervention;  Surgeon: Leonie Man, MD;  Location: [place] CV LAB;  Service: Cardiovascular;  Laterality: N/A;  . EP IMPLANTABLE DEVICE N/A 09/14/2016   Procedure: ICD Generator Changeout;  Surgeon: Deboraha Sprang, MD;  Location: [place] CV LAB;  Service: Cardiovascular;  Laterality: N/A;  . ESOPHAGOGASTRODUODENOSCOPY N/A 07/12/2017   Procedure: ESOPHAGOGASTRODUODENOSCOPY (EGD);  Surgeon: Jerene Bears, MD;  Location[facility] ENDOSCOPY;  Service: Gastroenterology;  Laterality: N/A;  VAD pt so VAD team will need to accompany pt.   [name]  FETAL SURGERY FOR CONGENITAL HERNIA     ????? pt has no knowledge of this..    . INGUINAL HERNIA REPAIR    . INSERTION OF DIALYSIS CATHETER Right 06/26/2017   Procedure: INSERTION OF TUNNELED  DIALYSIS CATHETER RIGHT INTERNAL JUGULAR;  Surgeon: Conrad [place], MD;  Location: [place];  Service: Vascular;  Laterality: Right;  . INSERTION OF IMPLANTABLE LEFT VENTRICULAR ASSIST DEVICE N/A 06/12/2017   Procedure: INSERTION OF IMPLANTABLE LEFT VENTRICULAR ASSIST [place] 3;  Surgeon: Ivin Poot, MD;  Location: [place];  Service: Open Heart Surgery;  Laterality: N/A;  HM3 LVAD  CIRC ARREST  NITRIC OXIDE  .  INSERTION OF IMPLANTABLE LEFT VENTRICULAR ASSIST DEVICE N/A 02/28/2018   Procedure: REDO INSERTION OF IMPLANTABLE LEFT VENTRICULAR ASSIST DEVICE - HEARTMATE 3;  Surgeon: Ivin Poot, MD;  Location: [place];  Service: Open Heart Surgery;  Laterality: N/A;  . IR REMOVAL TUN CV CATH W/O FL  02/26/2018  . IR THORACENTESIS ASP PLEURAL SPACE W/IMG GUIDE  06/28/2017  . LIGATION OF ARTERIOVENOUS  FISTULA Right 10/31/2017   Procedure: LIGATION OF BRACHIO-BASILIC VEIN TRANSPOSITION RIGHT ARM;  Surgeon: Conrad [place], MD;  Location: [place];  Service: Vascular;  Laterality: Right;  . MULTIPLE EXTRACTIONS WITH ALVEOLOPLASTY N/A 06/08/2017   Procedure: MULTIPLE EXTRACTION WITH ALVEOLOPLASTY AND PRE PROSTHETIC SURGERY AS NEEDED;  Surgeon: Lenn Cal, DDS;  Location: [place];  Service: Oral Surgery;  Laterality: N/A;  . RIGHT HEART CATH N/A 06/07/2017   Procedure: RIGHT HEART CATH;  Surgeon: Larey Dresser, MD;  Location: [place] CV LAB;  Service: Cardiovascular;  Laterality: N/A;  . RIGHT HEART CATH N/A 02/08/2018   Procedure: RIGHT HEART CATH;  Surgeon: Larey Dresser, MD;  Location: [place] CV LAB;  Service: Cardiovascular;  Laterality: N/A;  . RIGHT/LEFT HEART CATH AND CORONARY ANGIOGRAPHY N/A 11/23/2016   Procedure: Right/Left Heart Cath and Coronary Angiography;  Surgeon: Larey Dresser, MD;  Location: [place] CV LAB;  Service: Cardiovascular;  Laterality: N/A;  . TEE WITHOUT CARDIOVERSION N/A 06/12/2017   Procedure: TRANSESOPHAGEAL ECHOCARDIOGRAM (TEE);  Surgeon: Prescott Gum, Collier [place]ina, MD;  Location: [place];  Service: Open Heart Surgery;  Laterality: N/A;  . TEE WITHOUT CARDIOVERSION N/A 02/28/2018   Procedure: TRANSESOPHAGEAL ECHOCARDIOGRAM (TEE);  Surgeon: Prescott Gum, Collier [place]ina, MD;  Location: [place];  Service: Open Heart Surgery;  Laterality: N/A;  . THROMBECTOMY AND REVISION OF ARTERIOVENTOUS (AV) GORETEX  GRAFT Right 12/12/2017   Procedure: THROMBECTOMY ARTERIOVENTOUS (AV) GORETEX  GRAFT RIGHT UPPER  ARM;  Surgeon: Conrad [place], MD;  Location[facility] OR;  Service: Vascular;  Laterality: Right;   Family History  Family History  Problem Relation Age of Onset  . Colon polyps Mother   . Diabetes Mother   . Heart attack Father   . Diabetes Father   . Stroke Paternal Grandfather   . Colitis Sister   . Heart disease Brother   . Colitis Sister   . Colon cancer Neg Hx    Social History  reports that he has never smoked. He has never used smokeless tobacco. He reports that he does not drink alcohol or use drugs. Allergies  Allergies  Allergen Reactions  . Metformin And Related Diarrhea   Home medications Prior to Admission medications   Medication Sig Start Date End Date Taking? Authorizing Provider  aspirin EC 81 MG tablet Take 1 tablet (81 mg total) by mouth daily. 07/26/17   Larey Dresser, MD  atorvastatin (LIPITOR)  40 MG tablet Take 1 tablet (40 mg total) by mouth daily at 6 PM. 01/25/18   [name], MD  citalopram (CELEXA) 20 MG tablet Take 1 tablet (20 mg total) by mouth daily. 03/19/18   Georgiana Shore, NP  docusate sodium (COLACE) 100 MG capsule Take 1 capsule (100 mg total) by mouth 2 (two) times daily as needed for mild constipation. 03/25/18   Isaiah Serge, NP  dronabinol (MARINOL) 2.5 MG capsule Take 1 capsule (2.5 mg total) by mouth 2 (two) times daily before lunch and supper. 03/27/18   Larey Dresser, MD  erythromycin (E-MYCIN) 250 MG tablet Take 1 tablet (250 mg total) by mouth 3 (three) times daily. 03/25/18   Isaiah Serge, NP  insulin aspart (NOVOLOG) 100 UNIT/ML injection Inject 5 Units into the skin 3 (three) times daily with meals. 01/22/18   Georgiana Shore, NP  insulin glargine (LANTUS) 100 UNIT/ML injection Inject 0.1 mLs (10 Units total) into the skin daily. 05/09/18   Lovenia Kim, MD  isosorbide-hydrALAZINE (BIDIL) 20-37.5 MG tablet Take 1.5 tablets by mouth every 8 (eight) hours. Take 1.5 tablets every 8 hours on non dialysis days. Patient  taking differently: Take 1.5 tablets by mouth See admin instructions. Take 1.5 tablets every 8 hours on non dialysis days. 03/19/18   Georgiana Shore, NP  LORazepam (ATIVAN) 0.5 MG tablet Take 1 tablet (0.5 mg total) by mouth every 12 (twelve) hours as needed (nausea). 03/27/18   Larey Dresser, MD  metoCLOPramide (REGLAN) 10 MG tablet Take 1 tablet (10 mg total) by mouth 3 (three) times daily. 01/22/18   Georgiana Shore, NP  ondansetron (ZOFRAN ODT) 4 MG disintegrating tablet Take 1 tablet (4 mg total) by mouth every 6 (six) hours as needed for nausea or vomiting. Patient not taking: Reported on 05/22/2018 01/10/17   Esterwood, Amy S, PA-C  pantoprazole (PROTONIX) 40 MG tablet Take 40 mg by mouth 2 (two) times daily.     [provider]  promethazine (PHENERGAN) 12.5 MG tablet Take 1 tablet (12.5 mg total) by mouth every 6 (six) hours as needed for nausea or vomiting. 09/16/17   Daune Perch, NP  warfarin (COUMADIN) 5 MG tablet Take 1 tablet (5 mg total) by mouth daily at 6 PM. 03/25/18   Isaiah Serge, NP   Liver Function Tests No results for input(s): AST, ALT, ALKPHOS, BILITOT, PROT, ALBUMIN in the last 168 hours. No results for input(s): LIPASE, AMYLASE in the last 168 hours. CBC No results for input(s): WBC, NEUTROABS, HGB, HCT, MCV, PLT in the last 168 hours. Basic Metabolic Panel No results for input(s): NA, K, CL, CO2, GLUCOSE, BUN, CREATININE, CALCIUM, PHOS in the last 168 hours. Iron/TIBC/Ferritin/ %Sat    Component Value Date/Time   IRON 40 (L) 11/02/2017 0016   TIBC 188 (L) 11/02/2017 0016   FERRITIN 523 (H) 11/02/2017 0016   IRONPCTSAT 21 11/02/2017 0016    Vitals:   05/23/18 1606  BP: (!) 128/101  Pulse: 80  Resp: 17  Temp: 98.8 F (37.1 C)  TempSrc: Oral  SpO2: 100%  Weight: 80.7 kg  Height: 5' 8"  (1.727 m)   Exam Gen alert, small framed, no distress No rash, cyanosis or gangrene Sclera anicteric, throat clear  No jvd or bruits Chest clear bilat RRR  lvad hum Abd soft ntnd no mass or ascites +bs GU normal male MS no joint effusions or deformity Ext no LE or UE edema, no wounds or  ulcers Neuro is alert, Ox 3 , nf RUA AVG no bruit noted    Home meds:  - aspirin 81/ atorvastatin 40/ pantoprazole 40 bid  - warfarin 5 qd  - isosorbide-hydralazine 20-37.5 bid  - erythromycin 250 tid/ dronabinol 2.5 bid/ citalopram 20/ lorazepam prn/ metoclopramide 10 tid/ prns  - insulin aspart 5 tid/ insulin glargine 10u qd   Dialysis: MWF [facility]  4h 51mn   78.5kg (leaving around 80kg recently)   3k/2Ca bath  RUA AVG  Hep none  - calc 0.5ug tiw  - no ESA   Impression: 1. Malfunctioning R arm AVG - placed in Feb 2019, simple declot in April 2019. For admit, had consulted VVS for access.  No HD access at this time.  No need of acute HD by exam, labs pending.  Will reassess in am.   2. ESRD - on HD < 1 year 3. CAD/ hx severe CM sp LVAD 4. Gastroparesis - on mult meds, stable now 5. HTN - bidil , per cards 6. Volume - hasn't been able to get to dry wt, may be gaining wt. Will ^edw to around 80kg.      Plan - as above  RKelly SplinterMD CCrestonpager [phone]  05/23/2018, 4:55 PM

## 2018-05-23 NOTE — Telephone Encounter (Signed)
Phone call with instructions and letter faxed to [place]. See letter.

## 2018-05-23 NOTE — Procedures (Signed)
Central Venous Catheter Insertion Procedure Note [name] [id number] 05-Aug-1966  Procedure: Insertion of Central Venous Catheter Indications: Drug and/or fluid administration  Procedure Details Consent: Risks of procedure as well as the alternatives and risks of each were explained to the (patient/caregiver).  Consent for procedure obtained. Time Out: Verified patient identification, verified procedure, site/side was marked, verified correct patient position, special equipment/implants available, medications/allergies/relevent history reviewed, required imaging and test results available.  Performed  Maximum sterile technique was used including antiseptics, cap, gloves, gown, hand hygiene, mask and sheet. Skin prep: Chlorhexidine; local anesthetic administered A antimicrobial bonded/coated triple lumen catheter was placed in the left internal jugular vein using the Seldinger technique and u/s guidance.  Evaluation Blood flow good Complications: No apparent complications Patient did tolerate procedure well. Chest X-ray ordered to verify placement.  CXR: pending.  [name] MD 05/23/2018, 6:11 PM

## 2018-05-23 NOTE — Progress Notes (Addendum)
ANTICOAGULATION CONSULT NOTE - Initial Consult  Pharmacy Consult:  Heparin Indication:  LVAD  Allergies  Allergen Reactions  . Metformin And Related Diarrhea    Patient Measurements: Height: 5' 8"  (172.7 cm) Weight: 177 lb 14.6 oz (80.7 kg)(scale A) IBW/kg (Calculated) : 68.4 Heparin Dosing Weight: 80 kg  Vital Signs: Temp: 98.8 F (37.1 C) (09/18 1606) Temp Source: Oral (09/18 1606) BP: 128/101 (09/18 1606) Pulse Rate: 80 (09/18 1606)  Labs: Recent Labs    05/23/18 1625  LABPROT 20.0*  INR 1.72  CREATININE 8.95*    Estimated Creatinine Clearance: 9.2 mL/min (A) (by C-G formula based on SCr of 8.95 mg/dL (H)).   Medical History: Past Medical History:  Diagnosis Date  . Angina   . ASCVD (arteriosclerotic cardiovascular disease)    , Anterior infarction 2005, LAD diagonal bifurcation intervention 03/2004  . Automatic implantable cardiac defibrillator -St. Jude's       . Benign neoplasm of colon   . CHF (congestive heart failure) ([place])   . Chronic systolic heart failure ([place])   . Coronary artery disease     Widely patent previously placed stents in the left anterior   . Crohn's disease ([place])   . 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 Dialysis patient ([place])   . Dyspnea   . Gastroparesis   . GERD (gastroesophageal reflux disease)   . High cholesterol   . Hyperlipidemia   . Hypersomnolent    Previous diagnosis of narcolepsy  . Hypertension, essential   . Ischemic cardiomyopathy    Ejection fraction 15-20% catheterization 2010  . Type II or unspecified type diabetes mellitus without mention of complication, not stated as uncontrolled   . Unspecified gastritis and gastroduodenitis without mention of hemorrhage       Assessment: 80 YOM admitted because he was unable to get dialysis due to catheter malfunction.  No need for urgent HD per Renal.  Patient has a history of LVAD and Factor V Leiden on Coumadin PTA.  Given need for  fistulogram, Coumadin is on hold and Pharmacy consulted to initiate IV heparin bridge.  INR currently sub-therapeutic at 1.72.  No bleeding reported.   Goal of Therapy:  Heparin level 0.3-0.7 units/ml Monitor platelets by anticoagulation protocol: Yes    Plan:  Heparin gtt at 1050 units/hr, no bolus Check 8 hr heparin level Daily heparin level, PT / INR and CBC   Caydn Justen D. Mina Marble, PharmD, BCPS, [place] 05/23/2018, 7:24 PM   ==========================  Addendum: Per RN, patient was bleeding at central line site so heparin initiation was delayed until 2130 when safe to start.  Will monitor closely for s/sx of bleeding.   Vianey Caniglia D. Mina Marble, PharmD, BCPS, [place] 05/23/2018, 9:43 PM

## 2018-05-23 NOTE — Telephone Encounter (Signed)
Rocell, dialysis tech at [facility] called VAD office to report she is unable to dialyze patient today. Says she has attempted twice with circuit clotting both times, she believes it is due to poor flow in right upper extremity fistula. (Pt is scheduled for fistulogram per Dr. Monica Martinez, [facility] next Wednesday). She has been unable to reach [facility] for patient update/plan.   Nurse reports pt has lost @ 400 - 450 cc's blood during attempts to dialyze today. His last blood work was last week on 05/16/18 with following results as reported by Rocell:  Hgb 11.5  Hct 37.8  Plts 180  LDH 187  She updated Dr. Jimmy Footman and he wants patient admitted today. Patient was sent home from dialysis center.  Dr. Aundra Dubin updated - will admit to VAD service for anticoagulation management in preparation for fitulogram.  Called [name] and asked that he come to hospital and report to Admissions. He agreed to same.  Report called to [facility] nursing staff. VAD cart available on unit.   Zada Girt RN, VAD Coordinator 24/7 VAD pager: [phone]

## 2018-05-24 ENCOUNTER — Encounter (HOSPITAL_COMMUNITY): Payer: Self-pay | Admitting: Vascular Surgery

## 2018-05-24 ENCOUNTER — Encounter (HOSPITAL_COMMUNITY)
Admission: AD | Disposition: A | Discharge status: Home / Self Care | Payer: Self-pay | Source: Ambulatory Visit | Attending: Cardiology

## 2018-05-24 ENCOUNTER — Inpatient Hospital Stay (HOSPITAL_COMMUNITY): Payer: BLUE CROSS/BLUE SHIELD

## 2018-05-24 DIAGNOSIS — Z992 Dependence on renal dialysis: Secondary | ICD-10-CM

## 2018-05-24 DIAGNOSIS — Z95811 Presence of heart assist device: Secondary | ICD-10-CM

## 2018-05-24 DIAGNOSIS — N186 End stage renal disease: Secondary | ICD-10-CM

## 2018-05-24 HISTORY — PX: TEMPORARY DIALYSIS CATHETER: CATH118312

## 2018-05-24 HISTORY — PX: CENTRAL LINE INSERTION: CATH118232

## 2018-05-24 HISTORY — PX: A/V SHUNTOGRAM: CATH118297

## 2018-05-24 LAB — CBC
HCT: 33.7 % — ABNORMAL LOW (ref 39.0–52.0)
Hemoglobin: 10.3 g/dL — ABNORMAL LOW (ref 13.0–17.0)
MCH: 27.8 pg (ref 26.0–34.0)
MCHC: 30.6 g/dL (ref 30.0–36.0)
MCV: 91.1 fL (ref 78.0–100.0)
PLATELETS: 193 10*3/uL (ref 150–400)
RBC: 3.7 MIL/uL — ABNORMAL LOW (ref 4.22–5.81)
RDW: 14.7 % (ref 11.5–15.5)
WBC: 8.4 10*3/uL (ref 4.0–10.5)

## 2018-05-24 LAB — GLUCOSE, CAPILLARY
GLUCOSE-CAPILLARY: 76 mg/dL (ref 70–99)
GLUCOSE-CAPILLARY: 123 mg/dL — AB (ref 70–99)
Glucose-Capillary: 65 mg/dL — ABNORMAL LOW (ref 70–99)
Glucose-Capillary: 139 mg/dL — ABNORMAL HIGH (ref 70–99)
Glucose-Capillary: 63 mg/dL — ABNORMAL LOW (ref 70–99)
Glucose-Capillary: 92 mg/dL (ref 70–99)

## 2018-05-24 LAB — BASIC METABOLIC PANEL
ANION GAP: 12 (ref 5–15)
BUN: 74 mg/dL — ABNORMAL HIGH (ref 6–20)
CALCIUM: 8.8 mg/dL — AB (ref 8.9–10.3)
CO2: 22 mmol/L (ref 22–32)
Chloride: 106 mmol/L (ref 98–111)
Creatinine, Ser: 9.39 mg/dL — ABNORMAL HIGH (ref 0.61–1.24)
GFR, EST AFRICAN AMERICAN: 6 mL/min — AB (ref >60)
GFR, EST NON AFRICAN AMERICAN: 6 mL/min — AB (ref >60)
Glucose, Bld: 104 mg/dL — ABNORMAL HIGH (ref 70–99)
Potassium: 6 mmol/L — ABNORMAL HIGH (ref 3.5–5.1)
Sodium: 140 mmol/L (ref 135–145)

## 2018-05-24 LAB — PROTIME-INR
INR: 2.24
Prothrombin Time: 24.6 seconds — ABNORMAL HIGH (ref 11.4–15.2)

## 2018-05-24 LAB — HEPARIN LEVEL (UNFRACTIONATED): Heparin Unfractionated: <0.1 IU/mL — ABNORMAL LOW (ref 0.30–0.70)

## 2018-05-24 LAB — LACTATE DEHYDROGENASE: LDH: 191 U/L (ref 98–192)

## 2018-05-24 LAB — HIV ANTIBODY (ROUTINE TESTING W REFLEX): HIV SCREEN 4TH GENERATION: NONREACTIVE

## 2018-05-24 SURGERY — A/V SHUNTOGRAM
Anesthesia: LOCAL | Laterality: Right

## 2018-05-24 MED ORDER — FENTANYL CITRATE (PF) 100 MCG/2ML IJ SOLN
INTRAMUSCULAR | Status: AC
  Filled 2018-05-24: qty 2

## 2018-05-24 MED ORDER — LIDOCAINE HCL (PF) 1 % IJ SOLN
INTRAMUSCULAR | Status: DC | PRN
  Administered 2018-05-24: 20 mL
  Administered 2018-05-24: 15 mL
  Administered 2018-05-24: 10 mL

## 2018-05-24 MED ORDER — DEXTROSE 50 % IV SOLN
INTRAVENOUS | Status: AC
  Administered 2018-05-24: 25 mL
  Filled 2018-05-24: qty 50

## 2018-05-24 MED ORDER — FENTANYL CITRATE (PF) 100 MCG/2ML IJ SOLN
INTRAMUSCULAR | Status: DC | PRN
  Administered 2018-05-24 (×2): 50 ug via INTRAVENOUS

## 2018-05-24 MED ORDER — IODIXANOL 320 MG/ML IV SOLN
INTRAVENOUS | Status: DC | PRN
  Administered 2018-05-24: 25 mL via INTRAVENOUS

## 2018-05-24 MED ORDER — HEPARIN SODIUM (PORCINE) 1000 UNIT/ML IJ SOLN
INTRAMUSCULAR | Status: DC | PRN
  Administered 2018-05-24: 3400 [IU] via INTRAVENOUS
  Administered 2018-05-25: 3400 [IU]

## 2018-05-24 MED ORDER — HEPARIN (PORCINE) IN NACL 1000-0.9 UT/500ML-% IV SOLN
INTRAVENOUS | Status: AC
  Filled 2018-05-24: qty 500

## 2018-05-24 MED ORDER — MIDAZOLAM HCL 2 MG/2ML IJ SOLN
INTRAMUSCULAR | Status: AC
  Filled 2018-05-24: qty 2

## 2018-05-24 MED ORDER — SODIUM CHLORIDE 0.9 % IV SOLN
INTRAVENOUS | Status: AC | PRN
  Administered 2018-05-24: 10 mL/h via INTRAVENOUS

## 2018-05-24 MED ORDER — HEPARIN (PORCINE) IN NACL 1000-0.9 UT/500ML-% IV SOLN
INTRAVENOUS | Status: DC | PRN
  Administered 2018-05-24: 500 mL

## 2018-05-24 MED ORDER — GELATIN ABSORBABLE 12-7 MM EX MISC
1.0000 | Freq: Once | CUTANEOUS | Status: AC
  Administered 2018-05-24: 1 via TOPICAL
  Filled 2018-05-24: qty 1

## 2018-05-24 MED ORDER — ONDANSETRON HCL 4 MG/2ML IJ SOLN
INTRAMUSCULAR | Status: AC
  Filled 2018-05-24: qty 2

## 2018-05-24 MED ORDER — MIDAZOLAM HCL 2 MG/2ML IJ SOLN
INTRAMUSCULAR | Status: DC | PRN
  Administered 2018-05-24 (×2): 1 mg via INTRAVENOUS

## 2018-05-24 MED ORDER — LIDOCAINE HCL (PF) 1 % IJ SOLN
INTRAMUSCULAR | Status: AC
  Filled 2018-05-24: qty 30

## 2018-05-24 MED ORDER — HEPARIN SODIUM (PORCINE) 1000 UNIT/ML IJ SOLN
INTRAMUSCULAR | Status: AC
  Filled 2018-05-24: qty 1

## 2018-05-24 SURGICAL SUPPLY — 13 items
BAG SNAP BAND KOVER 36X36 (MISCELLANEOUS) ×4 IMPLANT
CATH PALINDROME RT-P 15FX19CM (CATHETERS) ×1 IMPLANT
CATH PALINDROME RT-P 15FX23CM (CATHETERS) ×1 IMPLANT
COVER DOME SNAP 22 D (MISCELLANEOUS) ×4 IMPLANT
GUIDEWIRE ANGLED .035X150CM (WIRE) ×1 IMPLANT
KIT MICROPUNCTURE NIT STIFF (SHEATH) ×1 IMPLANT
PROTECTION STATION PRESSURIZED (MISCELLANEOUS) ×4
SHEATH PROBE COVER 6X72 (BAG) ×1 IMPLANT
STATION PROTECTION PRESSURIZED (MISCELLANEOUS) ×3 IMPLANT
TRAY CATH 3LUMEN 20C SULFAFREE (CATHETERS) ×1 IMPLANT
TRAY PV CATH (CUSTOM PROCEDURE TRAY) ×4 IMPLANT
TUBING CIL FLEX 10 FLL-RA (TUBING) ×4 IMPLANT
WIRE BENTSON .035X145CM (WIRE) ×1 IMPLANT

## 2018-05-24 NOTE — Progress Notes (Signed)
VAD Coordinator Procedure Note:   VAD Coordinator met patient in Cath lab 8. Pt undergoing fisutula shuntogram per Dr. Carlis Abbott. Hemodynamics and VAD parameters monitored by myself and staff throughout the procedure. Blood pressures were obtained with automatic cuff on left arm.    Time: Doppler Auto  BP Flow PI Power Speed  Pre-procedure:  0848  141/105(113) 3.0 7.9 4.5 5500                    Secation Induction: 0915  131/104(103) 3.1 4.9 4.4 5500   0930  126/95(92) 3.4 5.3 4.5 5500   0945  130/96(106) 3.5 5.4 4.4 5500   1000  123/92(103) 3.4 6.4 4.3 5500   1015  123/97(105) 3.2 7.1 4.4 5500   1030  138/99(114) 3.2 7.1 4.4 5500   1045  131/93(110) 3.2 7.4 4.4 5500                                               Recovery Area: Taken directly to [facility]                   Patient tolerated the procedure well. VAD Coordinator accompanied and remained with patient.    Patient Disposition: Transported to [place] and handoff given to Arbie Cookey, Therapist, sports.  [name] RN, BSN VAD Coordinator 24/7 Pager [phone]

## 2018-05-24 NOTE — Progress Notes (Signed)
ANTICOAGULATION CONSULT NOTE  Pharmacy Consult:  Heparin Indication:  LVAD  Allergies  Allergen Reactions  . Metformin And Related Diarrhea    Patient Measurements: Height: 5' 8"  (172.7 cm) Weight: 176 lb 2.4 oz (79.9 kg) IBW/kg (Calculated) : 68.4 Heparin Dosing Weight: 80 kg  Vital Signs: Temp: 98.4 F (36.9 C) (09/19 0420) Temp Source: Oral (09/19 0420)  Labs: Recent Labs    05/23/18 1625 05/24/18 0330  HGB  --  10.3*  HCT  --  33.7*  PLT  --  193  LABPROT 20.0* 24.6*  INR 1.72 2.24  CREATININE 8.95* 9.39*    Estimated Creatinine Clearance: 8.8 mL/min (A) (by C-G formula based on SCr of 9.39 mg/dL (H)).   Medical History: Past Medical History:  Diagnosis Date  . Angina   . ASCVD (arteriosclerotic cardiovascular disease)    , Anterior infarction 2005, LAD diagonal bifurcation intervention 03/2004  . Automatic implantable cardiac defibrillator -St. Jude's       . Benign neoplasm of colon   . CHF (congestive heart failure) ([place])   . Chronic systolic heart failure ([place])   . Coronary artery disease     Widely patent previously placed stents in the left anterior   . Crohn's disease ([place])   . 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 Dialysis patient ([place])   . Dyspnea   . Gastroparesis   . GERD (gastroesophageal reflux disease)   . High cholesterol   . Hyperlipidemia   . Hypersomnolent    Previous diagnosis of narcolepsy  . Hypertension, essential   . Ischemic cardiomyopathy    Ejection fraction 15-20% catheterization 2010  . Type II or unspecified type diabetes mellitus without mention of complication, not stated as uncontrolled   . Unspecified gastritis and gastroduodenitis without mention of hemorrhage    Assessment: 54 YOM admitted because he was unable to get dialysis due to catheter malfunction. Patient has a hxLVAD and Factor V Leiden on Coumadin PTA.  Pharmacy consulted to initiate IV heparin bridge while warfarin on hold.  INR  was sub-therapeutic at 1.72 on admission.   Underwent RUE fistulogram on 9/19. Initial heparin level (drawn prior to HD session- no pause to infusion during procedure) came back undetectable, on 1050 units/hr. Hgb 10.3, plt 191. INR is 2.24 - last dose of warfarin on 9/17. Discussed with team, plan to continue heparin infusion despite therapeutic INR since warfarin remains on hold. No s/sx of bleeding.  Goal of Therapy:  Heparin level 0.3-0.7 units/ml Monitor platelets by anticoagulation protocol: Yes   Plan:  Increase heparin infusion to 1300 units/hr Check 8 hr heparin level Daily heparin level, PT / INR and CBC  Doylene Canard, PharmD Clinical Pharmacist  Pager: [phone] Phone: [phone] 05/24/2018, 7:35 AM

## 2018-05-24 NOTE — Progress Notes (Addendum)
[facility] Progress Note   Subjective:  Seen on HD, 2L UF goal. Tolerating so far without issues. No CP/dyspnea. S/p attempted declot of RUE AVG this morning - unsuccessful, and now with new L TDC placed.  Objective Vitals:   05/24/18 1052 05/24/18 1057 05/24/18 1105 05/24/18 1107  BP: (!) 130/99 (!) 129/92 (!) 138/98 (!) 132/99  Pulse: 76 74 (!) 110 74  Resp: (!) 9 11 (!) 0 (!) 0  Temp:      TempSrc:      SpO2: 100% 99% 100% 100%  Weight:      Height:       Physical Exam General: Well appearing man, NAD. Heart: Continuous LVAD hum Lungs: CTA anteriorly Extremities: No LE edema Dialysis Access: New L [facility]  Additional Objective Labs: Basic Metabolic Panel: Recent Labs  Lab 05/23/18 1625 05/24/18 0330  NA 140 140  K 6.1* 6.0*  CL 106 106  CO2 21* 22  GLUCOSE 123* 104*  BUN 64* 74*  CREATININE 8.95* 9.39*  CALCIUM 9.1 8.8*   Liver Function Tests: Recent Labs  Lab 05/23/18 1625  AST 29  ALT 40  ALKPHOS 119  BILITOT 0.9  PROT 7.1  ALBUMIN 3.3*   CBC: Recent Labs  Lab 05/24/18 0330  WBC 8.4  HGB 10.3*  HCT 33.7*  MCV 91.1  PLT 193   CBG: Recent Labs  Lab 05/23/18 2226 05/24/18 0812 05/24/18 0838 05/24/18 1218 05/24/18 1230  GLUCAP 114* 65* 139* 63* 76   Studies/Results: Dg Chest Port 1 View  Result Date: 05/23/2018 CLINICAL DATA:  Central venous catheter EXAM: PORTABLE CHEST 1 VIEW COMPARISON:  03/23/2018, 03/14/2018 FINDINGS: Postsurgical changes of the mediastinum. Left-sided pacing device as before. Similar orientation of LVAD. Left IJ central venous catheter tip overlies the SVC origin. No left pneumothorax. Tiny left pleural effusion. Cardiomegaly. IMPRESSION: 1. Left IJ central venous catheter tip overlies SVC origin. No pneumothorax 2. Cardiomegaly.  Trace left pleural effusion Electronically Signed   By: Donavan Foil M.D.   On: 05/23/2018 18:42   Medications: . heparin 1,050 Units/hr (05/24/18 1227)   . aspirin EC  81  mg Oral Daily  . atorvastatin  40 mg Oral q1800  . Chlorhexidine Gluconate Cloth  6 each Topical Q0600  . citalopram  20 mg Oral Daily  . dronabinol  2.5 mg Oral BID AC  . erythromycin  250 mg Oral TID  . Influenza vac split quadrivalent PF  0.5 mL Intramuscular Tomorrow-1000  . insulin aspart  5 Units Subcutaneous TID WC  . insulin glargine  10 Units Subcutaneous Daily  . isosorbide-hydrALAZINE  1.5 tablet Oral 3 times per day on Sun Tue Thu Sat  . metoCLOPramide  10 mg Oral TID  . pantoprazole  40 mg Oral BID    Dialysis Orders: MWF [facility]  4h 74mn   78.5kg (leaving around 80kg recently)   3k/2Ca bath  RUA AVG  Hep none  - calc 0.5ug tiw  - no ESA  Assessment/Plan: 1. Malfunctioning R arm AVG - placed in Feb 2019, simple declot in April 2019. VVS consulted; attempted declot this morning which was unsuccessful. Now with new L TDC. 2. ESRD: HD today d/t missed HD yesterday, will dialyze tomorrow to get back on usual schedule. 3. CAD/ hx severe CM sp LVAD 4. Gastroparesis - on mult meds, stable now 5. HTN - bidil , per cards 6. Volume - hasn't been able to get to dry wt, may be gaining wt. Will ^Deniece Ree  to around 80kg.     Veneta Penton, PA-C 05/24/2018, 12:59 PM  [facility] Pager: [phone]  Pt seen, examined and agree w A/P as above. Went to OR showing occluded AV graft, RUA.  New TDC was placed, pt on HD now.  On IV heparin, will ask Vasc surg to see about new permanent access.   Kelly Splinter MD [employer] pager [phone]   05/24/2018, 1:20 PM

## 2018-05-24 NOTE — Progress Notes (Signed)
LVAD Coordinator Rounding Note:  Direct admitted 05/23/18 from dialysis center. Dialysis center unable to access RUA fistula for dialysis treatment.  HM III LVAD implanted on 06/12/17 by Dr. Prescott Gum under Destination Therapy criteria due to renal insufficiency. Pump exchange on 03/01/18 for outflow graft twist/obstruction.   Pt was seen cath lab today for fistulagram.   Vital signs: Temp: 98.2 HR:  72 Doppler Pressure:  120 Auto BP:  120/100 O2 Sat: 100% on RA Wt: 79.9kg  LVAD interrogation reveals:  Speed: 5500 Flow: 3.0 Power: 4.5w PI: 7.9 Alarms: none  Events:  none Hematocrit: 34 Fixed speed: 5500 Low speed limit: 5200  Drive Line:  Pt is currently doing every 3 days dressing changes using the daily kit with silver strip. Dressing is CDI w/anchor secure. Next dressing change is due 05/25/18. Bedside nurse may change dressing.  Labs:  LDH trend: 191  INR trend: 2.24  Blood Products: None this admission   Anticoagulation Plan: -INR Goal: 2.0 - 3.0-currently on Heparin drip -ASA Dose: 81 mg daily   Device: -St Jude dual  -Therapies: currently off - Monitor: on VT 150 bpm  Adverse Events on VAD: - ESRD post LVAD; started CVVH 06/15/17; HD started 06/20/17 - Hospitalization 11/5 - 07/16/17 > gastroparesis diagnosis after EGD - Hospitalization 11/26 - 08/04/17 > gastroparesis - Hospitalization 1/8 - 09/16/17>gastroparesis - referred to Dr. Corliss Parish at [facility] - 10/31/17>rightupper arm arteriovenous graft with Artegraft; ligation of right first stage brachial vein transposition  - 12/11/13 elective admission for thrombectomy for AV graft in RUA  - 12/20/17 > intractable N/V - 01/19/18 > intractable N/V sent by EMS from dialysis center - 03/01/18 > outflow graft twist/occulsion requiring pump exchange - 05/23/18 > admitted for non-working fistula in RUA   Plan/Recommendations:  1. Please page VAD coordinator with any equipment questions or concerns.   [name] RN,  VAD Coordinator 24/7 VAD Pager: [phone]

## 2018-05-24 NOTE — Plan of Care (Signed)
  Problem: Education: Goal: Knowledge of General Education information will improve Description Including pain rating scale, medication(s)/side effects and non-pharmacologic comfort measures 05/24/2018 1021 by Don Perking, RN Outcome: Progressing 05/24/2018 1019 by Don Perking, RN Outcome: Progressing   Problem: Health Behavior/Discharge Planning: Goal: Ability to manage health-related needs will improve 05/24/2018 1021 by Don Perking, RN Outcome: Progressing 05/24/2018 1019 by Don Perking, RN Outcome: Progressing   Problem: Clinical Measurements: Goal: Ability to maintain clinical measurements within normal limits will improve 05/24/2018 1021 by Don Perking, RN Outcome: Progressing 05/24/2018 1019 by Don Perking, RN Outcome: Progressing Goal: Will remain free from infection 05/24/2018 1021 by Don Perking, RN Outcome: Progressing 05/24/2018 1019 by Don Perking, RN Outcome: Progressing Goal: Diagnostic test results will improve 05/24/2018 1021 by Don Perking, RN Outcome: Progressing 05/24/2018 1019 by Don Perking, RN Outcome: Progressing Goal: Respiratory complications will improve 05/24/2018 1021 by Don Perking, RN Outcome: Progressing 05/24/2018 1019 by Don Perking, RN Outcome: Progressing Goal: Cardiovascular complication will be avoided 05/24/2018 1021 by Don Perking, RN Outcome: Progressing 05/24/2018 1019 by Don Perking, RN Outcome: Progressing   Problem: Activity: Goal: Risk for activity intolerance will decrease 05/24/2018 1021 by Don Perking, RN Outcome: Progressing 05/24/2018 1019 by Don Perking, RN Outcome: Progressing   Problem: Nutrition: Goal: Adequate nutrition will be maintained 05/24/2018 1021 by Don Perking, RN Outcome: Progressing 05/24/2018 1019 by Don Perking, RN Outcome: Progressing   Problem: Coping: Goal: Level of anxiety will  decrease 05/24/2018 1021 by Don Perking, RN Outcome: Progressing 05/24/2018 1019 by Don Perking, RN Outcome: Progressing   Problem: Elimination: Goal: Will not experience complications related to bowel motility 05/24/2018 1021 by Don Perking, RN Outcome: Progressing 05/24/2018 1019 by Don Perking, RN Outcome: Progressing Goal: Will not experience complications related to urinary retention 05/24/2018 1021 by Don Perking, RN Outcome: Progressing 05/24/2018 1019 by Don Perking, RN Outcome: Progressing   Problem: Pain Managment: Goal: General experience of comfort will improve 05/24/2018 1021 by Don Perking, RN Outcome: Progressing 05/24/2018 1019 by Don Perking, RN Outcome: Progressing   Problem: Safety: Goal: Ability to remain free from injury will improve 05/24/2018 1021 by Don Perking, RN Outcome: Progressing 05/24/2018 1019 by Don Perking, RN Outcome: Progressing   Problem: Skin Integrity: Goal: Risk for impaired skin integrity will decrease 05/24/2018 1021 by Don Perking, RN Outcome: Progressing 05/24/2018 1019 by Don Perking, RN Outcome: Progressing   Problem: Cardiac: Goal: LVAD will function as expected and patient will experience no clinical alarms 05/24/2018 1021 by Don Perking, RN Outcome: Progressing 05/24/2018 1019 by Don Perking, RN Outcome: Progressing   Problem: Education: Goal: Patient will understand all VAD equipment and how it functions 05/24/2018 1021 by Don Perking, RN Outcome: Progressing 05/24/2018 1019 by Don Perking, RN Outcome: Progressing Goal: Patient will be able to verbalize current INR target range and antiplatelet therapy for discharge home 05/24/2018 1021 by Don Perking, RN Outcome: Progressing 05/24/2018 1019 by Don Perking, RN Outcome: Progressing

## 2018-05-24 NOTE — Op Note (Signed)
Date: May 24, 2018  Preoperative diagnosis: Concern for occluded right upper extremity arteriovenous graft (Artegraft)  Postoperative diagnosis: Same  Procedure: 1.  Right upper extremity fistulogram with US guided access of the right upper extremity graft 2.  Placement of right internal jugular triple-lumen catheter 3.  Exchange of left internal jugular triple-lumen catheter for a tunneled dialysis catheter (23 cm palindrome) 4. 95 minutes of monitored conscious sedation time  Surgeon: Dr. Marty Heck, MD  Indications: Patient is a 52 year old male with multiple medical comorbidities including end-stage renal disease on hemodialysis as well as current LVAD.  He was admitted yesterday after dialysis had trouble accessing his right upper extremity graft.  He was brought today for a right upper extremity fistulogram and possible placement of a tunneled dialysis catheter.  Risks and benefits have been discussed with the patient.  Findings: Once we accessed the right upper extremity graft there was no flow suggesting thrombosis of the graft.  Ultimately we went ahead and performed a fistulogram and there was thrombus throughout the upper extremity graft with limited inflow into a very narrowed and sclerotic brachial vein.  In addition with upper arm compression and retrograde interpretation of the graft we saw no filling of the brachial artery suggesting proximal occlusion as well.  We then placed a RIJ triple lumen for IV acces and exchanged the left triple lumen for a tunneled dialysis catheter.  Details: The patient was taken to PV 8 after informed consent was obtained.  He was placed on operating table in spine position.  Initially his right arm was prepped and draped in usual sterile fashion.  A timeout was performed to identify patient, procedure, and site.  We initially accessed his right upper extremity graft with ultrasound guidance using a micro access sheath and placed a micro  sheath.  Initially we had no backbleeding once the sheath was in place and we confirmed with ultrasound guidance that we were indeed in the lumen of the graft.  We then obtained sequential injections of the right upper extremity graft including centrally.  Ultimately this showed that the graft was occluded with thrombus and the graft was also draining into a very small sclerotic highly stenosed brachial vein.  At that point I did not feel the graft was salvageable given that he had multiple thrombectomies of this graft and it is never really worked appropriately. I then talked with the patient's cardiologist regarding best options moving forward.  Ultimately the patient is a difficult stick and his only current access is a triple-lumen in his left IJ.  Initially made plans to place a right IJ tunneled dialysis catheter.  The right neck was prepped and drapped in sterile fashion.  Unfortunately after accessing the right IJ with ultrasound guidance and placing an 18-gauge needle I could not get the J-wire to thread centrally past the distal IJ.  Ultimately I placed a micro-sheath and exchanged for a soft angled glide that I was able to advance into the right atrium.  Over this I could place a dilator and was able to thread a triple-lumen catheter into the right atrium.  Patient likely has a stenosis at the internal jugular subclavian confluence on the right but given that I was able to get the catheter past this into the right atrium and it flushed and aspirated appropriately I thought this was okay.  I then secured the triple-lumen in place with multiple sutures and a sterile occlusive dressing.  The catheter was placed in the right  atrium under fluoroscopic guidance. I then turned my attention to the left IJ catheter after we prepped and draped the left neck.  Ultimately I placed a benson wire down one of the ports under fluoroscopic guidance into the right atrium.  I then removed the triple-lumen catheter in the  left neck that had been placed yesterday by the medicine team.  We then used a 23 cm Palindrome catheter and measured out an appropriate distance on the chest wall.  I then made a counterincision on the chest wall and another incision at our wire insertion site.  I tunneled from the chest wall exit site to the stick site on the neck.  We then tunneled the catheter retrograde through the tunnel after it had been flushed.  The catheter was then cut and a clamp was placed where it exited the chest wall.  I then inserted serial dilators until I placed a large peel-away sheath in the left IJ under fluoroscopic guidance that was advanced into the right atrium.  Using this peel-away sheath ultimately I was able to place the 23 cm palindrome catheter and then peel-away the sheath.  I was careful to make sure that our cuff stayed under the skin.  I did check the top of her catheter make sure it was not kinked.  I then flushed and aspirated the catheter and it seemed to be working well.  Caps were applied.  Appropriate heparin dose was then locked in the catheter according to manufacturer guidelines.  He seemed to tolerate procedure without any apparent complications.  I placed a figure-of-eight stitch around the catheter exit site on his chest wall with a nylon suture.  I also placed a 4-0 Monocryl subcuticular on the access site on his neck.  Both of these were covered in Dermabond.  Sterile dressing was applied.  He was taken back to his room in stable condition.  Condition: Stable  Anesthesia: Moderate sedation  Marty Heck, MD [facility] Office: [phone] Pager: [place]

## 2018-05-24 NOTE — Plan of Care (Signed)
  Problem: Education: Goal: Knowledge of General Education information will improve Description Including pain rating scale, medication(s)/side effects and non-pharmacologic comfort measures Outcome: Progressing   Problem: Health Behavior/Discharge Planning: Goal: Ability to manage health-related needs will improve Outcome: Progressing   Problem: Clinical Measurements: Goal: Ability to maintain clinical measurements within normal limits will improve Outcome: Progressing Goal: Will remain free from infection Outcome: Progressing Goal: Diagnostic test results will improve Outcome: Progressing Goal: Respiratory complications will improve Outcome: Progressing Goal: Cardiovascular complication will be avoided Outcome: Progressing   Problem: Activity: Goal: Risk for activity intolerance will decrease Outcome: Progressing   Problem: Nutrition: Goal: Adequate nutrition will be maintained Outcome: Progressing   Problem: Coping: Goal: Level of anxiety will decrease Outcome: Progressing   Problem: Elimination: Goal: Will not experience complications related to bowel motility Outcome: Progressing Goal: Will not experience complications related to urinary retention Outcome: Progressing   Problem: Pain Managment: Goal: General experience of comfort will improve Outcome: Progressing   Problem: Safety: Goal: Ability to remain free from injury will improve Outcome: Progressing   Problem: Skin Integrity: Goal: Risk for impaired skin integrity will decrease Outcome: Progressing   Problem: Cardiac: Goal: LVAD will function as expected and patient will experience no clinical alarms Outcome: Progressing   Problem: Education: Goal: Patient will understand all VAD equipment and how it functions Outcome: Progressing Goal: Patient will be able to verbalize current INR target range and antiplatelet therapy for discharge home Outcome: Progressing

## 2018-05-24 NOTE — Progress Notes (Signed)
Patient ID: [name], male   DOB: [birth year], 52 y.o.   MRN: [id number] IR aware of request for port a cath placement in pt. Case reviewed by Dr. Kathlene Cote. PT/INR currently 24.6/2.24 which is too high for safe port placement. Will cont to monitor.

## 2018-05-24 NOTE — Progress Notes (Addendum)
Advanced Heart Failure VAD Team Note  PCP-Cardiologist: No primary care provider on file.   Subjective:    Went for shuntogram with VVS today. For HD afterward.  K 6.1 > 6.0 this morning. Will need HD once he has access.  INR 2.24. On heparin drip.   Unable to fix fistula today. Has tunneled HD cath. Feels fine. No CP, SOB, bleeding.  LVAD INTERROGATION:  HeartMate 3 LVAD:   Flow 3.2 liters/min, speed 5500, power 5, PI 6.    Objective:    Vital Signs:   Temp:  [98 F (36.7 C)-98.8 F (37.1 C)] 98 F (36.7 C) (09/19 0816) Pulse Rate:  [80-85] 85 (09/19 0816) Resp:  [15-22] 15 (09/19 0816) BP: (110-128)/(94-101) 110/95 (09/19 0816) SpO2:  [98 %-100 %] 100 % (09/19 0911) Weight:  [79.9 kg-80.7 kg] 79.9 kg (09/19 0337)   Mean arterial Pressure 100-110  Intake/Output:   Intake/Output Summary (Last 24 hours) at 05/24/2018 1039 Last data filed at 05/24/2018 1000 Gross per 24 hour  Intake 499.82 ml  Output -  Net 499.82 ml     Physical Exam    General:  Well appearing. No resp difficulty HEENT: normal Neck: supple. JVP ~8. Carotids 2+ bilat; no bruits. No lymphadenopathy or thyromegaly appreciated. Cor: Mechanical heart sounds with LVAD hum present. Left IJ tunneled HD cath. Lungs: clear Abdomen: soft, nontender, nondistended. No hepatosplenomegaly. No bruits or masses. Good bowel sounds. Driveline: C/D/I; securement device intact and driveline incorporated Extremities: no cyanosis, clubbing, rash, edema. RUE AVF Neuro: alert & orientedx3, cranial nerves grossly intact. moves all 4 extremities w/o difficulty. Affect pleasant   Telemetry   NSR 80s. Personally reviewed.   EKG    No new tracings.   Labs   Basic Metabolic Panel: Recent Labs  Lab 05/23/18 1625 05/24/18 0330  NA 140 140  K 6.1* 6.0*  CL 106 106  CO2 21* 22  GLUCOSE 123* 104*  BUN 64* 74*  CREATININE 8.95* 9.39*  CALCIUM 9.1 8.8*    Liver Function Tests: Recent Labs  Lab  05/23/18 1625  AST 29  ALT 40  ALKPHOS 119  BILITOT 0.9  PROT 7.1  ALBUMIN 3.3*   No results for input(s): LIPASE, AMYLASE in the last 168 hours. No results for input(s): AMMONIA in the last 168 hours.  CBC: Recent Labs  Lab 05/24/18 0330  WBC 8.4  HGB 10.3*  HCT 33.7*  MCV 91.1  PLT 193    INR: Recent Labs  Lab 05/23/18 1625 05/24/18 0330  INR 1.72 2.24    Other results:  EKG:    Imaging   Dg Chest Port 1 View  Result Date: 05/23/2018 CLINICAL DATA:  Central venous catheter EXAM: PORTABLE CHEST 1 VIEW COMPARISON:  03/23/2018, 03/14/2018 FINDINGS: Postsurgical changes of the mediastinum. Left-sided pacing device as before. Similar orientation of LVAD. Left IJ central venous catheter tip overlies the SVC origin. No left pneumothorax. Tiny left pleural effusion. Cardiomegaly. IMPRESSION: 1. Left IJ central venous catheter tip overlies SVC origin. No pneumothorax 2. Cardiomegaly.  Trace left pleural effusion Electronically Signed   By: Donavan Foil M.D.   On: 05/23/2018 18:42      Medications:     Scheduled Medications: . [MAR Hold] aspirin EC  81 mg Oral Daily  . [MAR Hold] atorvastatin  40 mg Oral q1800  . [MAR Hold] Chlorhexidine Gluconate Cloth  6 each Topical Q0600  . [MAR Hold] citalopram  20 mg Oral Daily  . [MAR Hold] dronabinol  2.5 mg Oral BID AC  . [MAR Hold] erythromycin  250 mg Oral TID  . [MAR Hold] Influenza vac split quadrivalent PF  0.5 mL Intramuscular Tomorrow-1000  . [MAR Hold] insulin aspart  5 Units Subcutaneous TID WC  . [MAR Hold] insulin glargine  10 Units Subcutaneous Daily  . [MAR Hold] isosorbide-hydrALAZINE  1.5 tablet Oral 3 times per day on Sun Tue Thu Sat  . [MAR Hold] metoCLOPramide  10 mg Oral TID  . [MAR Hold] pantoprazole  40 mg Oral BID     Infusions: . sodium chloride 10 mL/hr (05/24/18 0910)  . heparin 1,050 Units/hr (05/24/18 0400)     PRN Medications:  sodium chloride, [MAR Hold] acetaminophen, [MAR Hold]  docusate sodium, fentaNYL, Heparin (Porcine) in NaCl, lidocaine (PF), [MAR Hold] LORazepam, midazolam, [MAR Hold] ondansetron (ZOFRAN) IV, [MAR Hold] promethazine   Patient Profile   [name] a 52 y.o.malewith history of CAD s/p anterior MI in 2010 and NSTEMI in 3/18 with DES to pRCA and mLAD and ischemic cardiomyopathy now s/p Heartmate 3 LVAD and ESRD He was admitted in 10/18 and Heartmate 3 LVAD was placed. He had baseline CKD stage 3 likely from diabetic nephropathy and CHF. He developed peri-op AKI and ended up on dialysis. No renal recovery to date.  Admitted with malfunctioning AVF. Nephrology recommended admit.   Assessment/Plan:    1. ESRD--unable to use AVF at dialysis today.  He has been followed by VVS for planned fistulagram on 9/23.  Nephrology following VVS performing AV shuntogram today. Will need HD afterward. K 6.0 this am.  2.Chronic Systolic Heart Failure with HMIII VAD, St Jude ICD  Volume managed with dialysis. Stable today. - LDH stable 191 - Continue bidil on non dialysis days.  - Continue asa 81 mg daily.  - Coumadin on hold for vascular access. On heparin drip. INR 2.24 3. HTN - Elevated today but missed HD yesterday. Typically gets bidil 1.5 tabs TID on non HD days. Follow. Continue bidil on non hd days.   4. h/o DVTs: Factor V Leiden heterozygote. - Coumadin on hold. On heparin drip.  5. Diabetic gastroparesis: -Stable for now.  - Continue reglan 5 mg tid/ac and Marinol.  - Continue Protonix bid.  - Continue eythromycin indefinitely.  6. DM: - Continue home regimen. No change. 7. PAD: -Moderate bilateral disease on ABIs in 8/18. Denies claudication currently, no pedal ulcers. No change. 8. Atrial flutter: Paroxysmal. - Remains in NSR. On heparin drip while off coumadin 9. CAD  - No s/s ischemia Continue statin and asa.  10. Hyperkalemia - K 6.0 today. Manage with HD today.  Consult IR for portacath. Discussed  with Dr Aundra Dubin. Will contact renal and VVS and see if they can come up with a plan for access going forward. Continue heparin and hold coumadin for now.  I reviewed the LVAD parameters from today, and compared the results to the patient's prior recorded data.  No programming changes were made.  The LVAD is functioning within specified parameters.  The patient performs LVAD self-test daily.  LVAD interrogation was negative for any significant power changes, alarms or PI events/speed drops.  LVAD equipment check completed and is in good working order.  Back-up equipment present.   LVAD education done on emergency procedures and precautions and reviewed exit site care.  Length of Stay: [place], NP 05/24/2018, 10:39 AM  VAD Team --- VAD ISSUES ONLY--- Pager [phone] (7am - 7am)  Advanced Heart Failure  Team  Pager [phone] (M-F; [place])  Please contact [facility] for night-coverage after hours (4p -7a ) and weekends on [email or web address]  Patient seen with NP, agree with the above note.    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  Tunneled HD catheter placed.  He is on IV heparin and warfarin is on hold.  He is at HD currently (missed HD yesterday).    Very difficult vascular access. We will plan on placing a right subclavian port via IR possibly tomorrow if INR is acceptable.   Need to make decision on long-term HD access.  Will hopefully be listed again soon for heart/kidney transplant.  He has clotted off an AV fistula.  I am concerned that with lack of pulsatility with LVAD and with h/o chronic clots/Factor V Leiden heterozygote, he will be at high risk for thrombosing AV fistula again if another is placed.  ?Ongoing HD via tunneled catheter.  Awaiting renal input on this.   Bertha Earwood 05/24/2018 2:56 PM

## 2018-05-24 NOTE — Progress Notes (Signed)
52 year old male well-known to the vascular surgery service.  He underwent a right upper extremity fistulogram today and his fistula was thrombosed and I did not feel this was salvageable.  We have been asked to evaluate for new access by nephrology.  I discussed this case with Dr.Schertz regarding my concern that his right upper extremity graft has never functioned well in the setting of his LVAD and potentially low flow.  Nephrology conferred with cardiology and it appears that all feel the best approach is to proceed with new access placement if possible and to ultimately remove his catheter.  Patient is amendable to a left upper extremity graft but he is adamant against any groin access placement.  I could not appreciate a palpable radial or brachial pulse in the left arm.  I have ordered a arterial duplex of the left arm and suspect this is likely related to his LVAD.  We will tentatively plan for left upper extremity graft placement on Monday in the OR.  Need to have his INR less than 1.5 by that time.  Marty Heck, MD [facility] Office: [phone] Pager: [place]

## 2018-05-25 ENCOUNTER — Ambulatory Visit (HOSPITAL_COMMUNITY): Payer: Self-pay

## 2018-05-25 ENCOUNTER — Inpatient Hospital Stay (HOSPITAL_COMMUNITY): Payer: BLUE CROSS/BLUE SHIELD

## 2018-05-25 DIAGNOSIS — Z01810 Encounter for preprocedural cardiovascular examination: Secondary | ICD-10-CM

## 2018-05-25 DIAGNOSIS — I5022 Chronic systolic (congestive) heart failure: Secondary | ICD-10-CM

## 2018-05-25 LAB — CBC
HCT: 30.8 % — ABNORMAL LOW (ref 39.0–52.0)
Hemoglobin: 9.7 g/dL — ABNORMAL LOW (ref 13.0–17.0)
MCH: 28.2 pg (ref 26.0–34.0)
MCHC: 31.5 g/dL (ref 30.0–36.0)
MCV: 89.5 fL (ref 78.0–100.0)
PLATELETS: 173 10*3/uL (ref 150–400)
RBC: 3.44 MIL/uL — ABNORMAL LOW (ref 4.22–5.81)
RDW: 14.6 % (ref 11.5–15.5)
WBC: 6.5 10*3/uL (ref 4.0–10.5)

## 2018-05-25 LAB — GLUCOSE, CAPILLARY
GLUCOSE-CAPILLARY: 172 mg/dL — AB (ref 70–99)
GLUCOSE-CAPILLARY: 195 mg/dL — AB (ref 70–99)
Glucose-Capillary: 192 mg/dL — ABNORMAL HIGH (ref 70–99)

## 2018-05-25 LAB — BASIC METABOLIC PANEL
Anion gap: 12 (ref 5–15)
BUN: 24 mg/dL — AB (ref 6–20)
CALCIUM: 8.1 mg/dL — AB (ref 8.9–10.3)
CO2: 26 mmol/L (ref 22–32)
CREATININE: 4.95 mg/dL — AB (ref 0.61–1.24)
Chloride: 99 mmol/L (ref 98–111)
GFR calc Af Amer: 14 mL/min — ABNORMAL LOW (ref >60)
GFR, EST NON AFRICAN AMERICAN: 12 mL/min — AB (ref >60)
Glucose, Bld: 124 mg/dL — ABNORMAL HIGH (ref 70–99)
Potassium: 4.5 mmol/L (ref 3.5–5.1)
SODIUM: 137 mmol/L (ref 135–145)

## 2018-05-25 LAB — PROTIME-INR
INR: 1.84
PROTHROMBIN TIME: 21.1 s — AB (ref 11.4–15.2)

## 2018-05-25 LAB — HEPARIN LEVEL (UNFRACTIONATED)
HEPARIN UNFRACTIONATED: 0.61 [IU]/mL (ref 0.30–0.70)
Heparin Unfractionated: 0.76 IU/mL — ABNORMAL HIGH (ref 0.30–0.70)

## 2018-05-25 LAB — LACTATE DEHYDROGENASE: LDH: 195 U/L — AB (ref 98–192)

## 2018-05-25 MED ORDER — LIDOCAINE-PRILOCAINE 2.5-2.5 % EX CREA
1.0000 "application " | TOPICAL_CREAM | CUTANEOUS | Status: DC | PRN
  Filled 2018-05-25: qty 5

## 2018-05-25 MED ORDER — HEPARIN SODIUM (PORCINE) 1000 UNIT/ML IJ SOLN
INTRAMUSCULAR | Status: AC
  Administered 2018-05-25: 3400 [IU]
  Filled 2018-05-25: qty 4

## 2018-05-25 MED ORDER — PENTAFLUOROPROP-TETRAFLUOROETH EX AERO
1.0000 "application " | INHALATION_SPRAY | CUTANEOUS | Status: DC | PRN
  Filled 2018-05-25: qty 103.5

## 2018-05-25 MED ORDER — DARBEPOETIN ALFA 40 MCG/0.4ML IJ SOSY
PREFILLED_SYRINGE | INTRAMUSCULAR | Status: AC
  Filled 2018-05-25: qty 0.4

## 2018-05-25 MED ORDER — NEPRO/CARBSTEADY PO LIQD
237.0000 mL | Freq: Two times a day (BID) | ORAL | Status: DC
  Administered 2018-05-25 – 2018-06-05 (×14): 237 mL via ORAL
  Filled 2018-05-25 (×26): qty 237

## 2018-05-25 MED ORDER — DARBEPOETIN ALFA 40 MCG/0.4ML IJ SOSY
40.0000 ug | PREFILLED_SYRINGE | INTRAMUSCULAR | Status: DC
  Administered 2018-05-25 – 2018-06-01 (×2): 40 ug via INTRAVENOUS
  Filled 2018-05-25: qty 0.4

## 2018-05-25 MED ORDER — SODIUM CHLORIDE 0.9 % IV SOLN: 100.0000 mL | INTRAVENOUS | Status: DC | PRN

## 2018-05-25 NOTE — Progress Notes (Addendum)
[facility] Progress Note   Subjective:  Seen on HD again today - getting back on usual schedule. BP low today, no UF at this time. He denies CP or dyspnea. VVS tentatively planning for new LUE AVG on 9/23.  Objective Vitals:   05/25/18 0613 05/25/18 0740 05/25/18 0750 05/25/18 0800  BP:   (!) 78/60 (!) 81/71  Pulse:   (!) 47 (!) 52  Resp:  20    Temp:  98.4 F (36.9 C)    TempSrc:  Oral    SpO2:  100%    Weight: 78.6 kg 80.2 kg    Height:       Physical Exam General: Well appearing man, NAD. Heart: Continuous LVAD hum Lungs: CTA anteriorly Extremities: No LE edema Dialysis Access: [facility]  Additional Objective Labs: Basic Metabolic Panel: Recent Labs  Lab 05/23/18 1625 05/24/18 0330 05/25/18 0312  NA 140 140 137  K 6.1* 6.0* 4.5  CL 106 106 99  CO2 21* 22 26  GLUCOSE 123* 104* 124*  BUN 64* 74* 24*  CREATININE 8.95* 9.39* 4.95*  CALCIUM 9.1 8.8* 8.1*   Liver Function Tests: Recent Labs  Lab 05/23/18 1625  AST 29  ALT 40  ALKPHOS 119  BILITOT 0.9  PROT 7.1  ALBUMIN 3.3*   CBC: Recent Labs  Lab 05/24/18 0330 05/25/18 0312  WBC 8.4 6.5  HGB 10.3* 9.7*  HCT 33.7* 30.8*  MCV 91.1 89.5  PLT 193 173   CBG: Recent Labs  Lab 05/24/18 0838 05/24/18 1218 05/24/18 1230 05/24/18 1758 05/24/18 2117  GLUCAP 139* 63* 76 92 123*   Studies/Results: Dg Chest 1 View  Result Date: 05/24/2018 CLINICAL DATA:  Status post AV shuntogram with temporary dialysis catheter and central line insertion. EXAM: CHEST  1 VIEW COMPARISON:  05/23/2018 FINDINGS: New right IJ central line catheter terminates in the mid right atrium. Temporary dialysis catheter from left IJ approach terminates in the proximal right atrium. ICD device projects over the left axilla with leads in the right atrium and right ventricle. Left ventricular assist device is unchanged in appearance. Median sternotomy sutures are in place. No overt pulmonary edema or pneumothorax. No  pulmonary consolidation. Atelectasis at the right lung base. IMPRESSION: Satisfactory support line and tube positions as above. No pneumothorax status post right IJ central line and dialysis catheter placement. Electronically Signed   By: Ashley Royalty M.D.   On: 05/24/2018 19:59   Dg Chest Port 1 View  Result Date: 05/23/2018 CLINICAL DATA:  Central venous catheter EXAM: PORTABLE CHEST 1 VIEW COMPARISON:  03/23/2018, 03/14/2018 FINDINGS: Postsurgical changes of the mediastinum. Left-sided pacing device as before. Similar orientation of LVAD. Left IJ central venous catheter tip overlies the SVC origin. No left pneumothorax. Tiny left pleural effusion. Cardiomegaly. IMPRESSION: 1. Left IJ central venous catheter tip overlies SVC origin. No pneumothorax 2. Cardiomegaly.  Trace left pleural effusion Electronically Signed   By: Donavan Foil M.D.   On: 05/23/2018 18:42   Medications: . sodium chloride    . heparin 1,200 Units/hr (05/25/18 0600)   . aspirin EC  81 mg Oral Daily  . atorvastatin  40 mg Oral q1800  . Chlorhexidine Gluconate Cloth  6 each Topical Q0600  . citalopram  20 mg Oral Daily  . dronabinol  2.5 mg Oral BID AC  . erythromycin  250 mg Oral TID  . Influenza vac split quadrivalent PF  0.5 mL Intramuscular Tomorrow-1000  . insulin aspart  5 Units Subcutaneous  TID WC  . insulin glargine  10 Units Subcutaneous Daily  . isosorbide-hydrALAZINE  1.5 tablet Oral 3 times per day on Sun Tue Thu Sat  . metoCLOPramide  10 mg Oral TID  . pantoprazole  40 mg Oral BID    Dialysis Orders: MWF [facility] 4h 33mn 78.5kg (leaving around 80kg recently) 3k/2Ca bath RUA AVG Hep none - calc 0.5ug tiw - no ESA  Assessment/Plan: 1. RUE AVG thrombosis: Placed in Feb 2019, simple declot in April 2019. VVS consulted; failed declot 9/19. TDC in place/use. Tentative plan is for new LUE AVG placement on 9/23. 2. ESRD: S/p HD yesterday, then back on usual MWF schedule today. No UF d/t low  BP. 3. CAD/severe ICM (s/p LVAD) 4. Gastroparesis: On multiple meds. Stable this admit. 5. HTN - bidil , per cards 6. Volume - hasn't been able to get to dry wt, may be gaining wt. Will ^edw to around 80kg. 7. Anemia of ESRD: Hgb 9.7, will start ESA today (Aranesp 470m weekly). 8. Secondary Hyperparathyroidism: Ca ok, no binders. Resume calcitriol. 9. Nutrition: Alb low, will start Nepro supplements.  KaVeneta PentonPA-C 05/25/2018, 8:46 AM  [facility] Pager: [phone] seen, examined and agree w A/P as above.  RoKelly SplinterD CaNewell Rubbermaidager [phone] 05/25/2018, 11:40 AM

## 2018-05-25 NOTE — Progress Notes (Signed)
ANTICOAGULATION CONSULT NOTE  Pharmacy Consult:  Heparin Indication:  LVAD  Allergies  Allergen Reactions  . Metformin And Related Diarrhea    Patient Measurements: Height: 5' 8"  (172.7 cm) Weight: 176 lb 12.9 oz (80.2 kg) IBW/kg (Calculated) : 68.4 Heparin Dosing Weight: 80 kg  Vital Signs: Temp: 98.4 F (36.9 C) (09/20 0740) Temp Source: Oral (09/20 0740) BP: 158/59 (09/20 1130) Pulse Rate: 58 (09/20 1130)  Labs: Recent Labs    05/23/18 1625 05/24/18 0330 05/24/18 1252 05/24/18 2326 05/25/18 0312 05/25/18 1003  HGB  --  10.3*  --   --  9.7*  --   HCT  --  33.7*  --   --  30.8*  --   PLT  --  193  --   --  173  --   LABPROT 20.0* 24.6*  --   --  21.1*  --   INR 1.72 2.24  --   --  1.84  --   HEPARINUNFRC  --   --  <0.10* 0.76*  --  0.61  CREATININE 8.95* 9.39*  --   --  4.95*  --     Estimated Creatinine Clearance: 16.7 mL/min (A) (by C-G formula based on SCr of 4.95 mg/dL (H)).   Medical History: Past Medical History:  Diagnosis Date  . Angina   . ASCVD (arteriosclerotic cardiovascular disease)    , Anterior infarction 2005, LAD diagonal bifurcation intervention 03/2004  . Automatic implantable cardiac defibrillator -St. Jude's       . Benign neoplasm of colon   . CHF (congestive heart failure) ([place])   . Chronic systolic heart failure ([place])   . Coronary artery disease     Widely patent previously placed stents in the left anterior   . Crohn's disease ([place])   . 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 Dialysis patient ([place])   . Dyspnea   . Gastroparesis   . GERD (gastroesophageal reflux disease)   . High cholesterol   . Hyperlipidemia   . Hypersomnolent    Previous diagnosis of narcolepsy  . Hypertension, essential   . Ischemic cardiomyopathy    Ejection fraction 15-20% catheterization 2010  . Type II or unspecified type diabetes mellitus without mention of complication, not stated as uncontrolled   . Unspecified gastritis and  gastroduodenitis without mention of hemorrhage    Assessment: 5 YOM admitted because he was unable to get dialysis due to catheter malfunction. Patient has a hxLVAD and Factor V Leiden on Coumadin PTA.  Pharmacy consulted to initiate IV heparin bridge while warfarin on hold.  INR was sub-therapeutic at 1.72 on admission.   Underwent RUE fistulogram on 9/19. - for plans to new HD access when INR < 1.5 CBC stable. No s/sx of bleeding. Heparin level 0.6 at goal heparin drip rate 1200uts/hr   Goal of Therapy:  Heparin level 0.3-0.7 units/ml Monitor platelets by anticoagulation protocol: Yes   Plan:  Continue heparin infusion  1200 units/hr Daily heparin level, PT / INR and CBC  Bonnita Nasuti Pharm.D. CPP, BCPS Clinical Pharmacist [phone] 05/25/2018 12:44 PM

## 2018-05-25 NOTE — Progress Notes (Signed)
Patient ID: [name], male   DOB: 10/26/1965, 52 y.o.   MRN: [id number]   INR 1.84 today Still too high for tunneled PAC placement to be placed safely per Dr Kathlene Cote  Can offer possibly Monday PAC placement If INR normalized And Would need Rt IJ trialysis catheter to be removed (also has new Rt IJ tunneled HD cath per Vasc) But would hope pt still has IV access of somekind--- as IR does sedate for [facility] placement  Discussed with Darrick Grinder NP  We understand pt is to go to OR on Mon for AVF revision/ new placement If Vasc can do all in OR setting-- we would certainly agree to Hold off need for IR involvement  Amy to let us know Will keep order on IR list for now

## 2018-05-25 NOTE — Progress Notes (Signed)
ANTICOAGULATION CONSULT NOTE  Pharmacy Consult:  Heparin Indication:  LVAD  Allergies  Allergen Reactions  . Metformin And Related Diarrhea    Patient Measurements: Height: 5' 8"  (172.7 cm) Weight: 177 lb 11.1 oz (80.6 kg) IBW/kg (Calculated) : 68.4 Heparin Dosing Weight: 80 kg  Vital Signs: Temp: 98.7 F (37.1 C) (09/19 2322) Temp Source: Oral (09/19 2322) BP: 99/81 (09/19 2322) Pulse Rate: 57 (09/19 2322)  Labs: Recent Labs    05/23/18 1625 05/24/18 0330 05/24/18 1252 05/24/18 2326  HGB  --  10.3*  --   --   HCT  --  33.7*  --   --   PLT  --  193  --   --   LABPROT 20.0* 24.6*  --   --   INR 1.72 2.24  --   --   HEPARINUNFRC  --   --  <0.10* 0.76*  CREATININE 8.95* 9.39*  --   --     Estimated Creatinine Clearance: 8.8 mL/min (A) (by C-G formula based on SCr of 9.39 mg/dL (H)).   Medical History: Past Medical History:  Diagnosis Date  . Angina   . ASCVD (arteriosclerotic cardiovascular disease)    , Anterior infarction 2005, LAD diagonal bifurcation intervention 03/2004  . Automatic implantable cardiac defibrillator -St. Jude's       . Benign neoplasm of colon   . CHF (congestive heart failure) ([place])   . Chronic systolic heart failure ([place])   . Coronary artery disease     Widely patent previously placed stents in the left anterior   . Crohn's disease ([place])   . 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 Dialysis patient ([place])   . Dyspnea   . Gastroparesis   . GERD (gastroesophageal reflux disease)   . High cholesterol   . Hyperlipidemia   . Hypersomnolent    Previous diagnosis of narcolepsy  . Hypertension, essential   . Ischemic cardiomyopathy    Ejection fraction 15-20% catheterization 2010  . Type II or unspecified type diabetes mellitus without mention of complication, not stated as uncontrolled   . Unspecified gastritis and gastroduodenitis without mention of hemorrhage    Assessment: 26 YOM admitted because he was unable to  get dialysis due to catheter malfunction. Patient has a hxLVAD and Factor V Leiden on Coumadin PTA.  Pharmacy consulted to initiate IV heparin bridge while warfarin on hold.  INR was sub-therapeutic at 1.72 on admission.   Underwent RUE fistulogram on 9/19. Initial heparin level (drawn prior to HD session- no pause to infusion during procedure) came back undetectable, on 1050 units/hr. Hgb 10.3, plt 191. INR is 2.24 - last dose of warfarin on 9/17. Discussed with team, plan to continue heparin infusion despite therapeutic INR since warfarin remains on hold. No s/sx of bleeding. Heparin level now slightly supratherapeutic.  Goal of Therapy:  Heparin level 0.3-0.7 units/ml Monitor platelets by anticoagulation protocol: Yes   Plan:  Decrease heparin infusion to 1200 units/hr Check 8 hr heparin level Daily heparin level, PT / INR and CBC  Thanks for allowing pharmacy to be a part of this patient's care.  Excell Seltzer, PharmD Clinical Pharmacist 05/25/2018, 12:37 AM

## 2018-05-25 NOTE — Progress Notes (Addendum)
Advanced Heart Failure VAD Team Note  PCP-Cardiologist: No primary care provider on file.   Subjective:    K 4.5 today. HD yesterday with 1.5 L off. In HD again today.  INR 1.84. On heparin drip. Coumadin on hold.  RUE AVG thrombosed, unable to restore flow. Planning to place new graft in LUE Monday. I spoke with IR today. They are still reviewing case and will let us know if they can place port today. If not, they recommended placing a port in OR when he goes for AVF. Vein mapping ordered for today.  Feels fine. No CP, SOB, or bleeding.  LVAD INTERROGATION:  HeartMate 3 LVAD:   Flow 4.5 liters/min, speed 5500, power 5, PI 2.1.    Objective:    Vital Signs:   Temp:  [98.2 F (36.8 C)-98.7 F (37.1 C)] 98.4 F (36.9 C) (09/20 0740) Pulse Rate:  [47-112] (P) 48 (09/20 0830) Resp:  [0-45] 20 (09/20 0740) BP: (78-151)/(36-103) (P) 76/51 (09/20 0830) SpO2:  [99 %-100 %] 100 % (09/20 0740) Weight:  [78.6 kg-82.4 kg] 80.2 kg (09/20 0740)   Mean arterial Pressure 80-90s  Intake/Output:   Intake/Output Summary (Last 24 hours) at 05/25/2018 0915 Last data filed at 05/25/2018 0600 Gross per 24 hour  Intake 515.69 ml  Output 1581 ml  Net -1065.31 ml     Physical Exam   General:  NAD.  HEENT: Normal. Neck: Supple, JVP 7-8 cm. Carotids OK. Left IJ tunneled HD cath. Right IJ TLC Cardiac:  Mechanical heart sounds with LVAD hum present.  Lungs:  CTAB, normal effort.  Abdomen:  NT, ND, no HSM. No bruits or masses. +BS  LVAD exit site: Well-healed and incorporated. Dressing dry and intact. No erythema or drainage. Stabilization device present and accurately applied. Driveline dressing changed daily per sterile technique. Extremities:  Warm and dry. No cyanosis, clubbing, rash, or edema. RUE AVF Neuro:  Alert & oriented x 3. Cranial nerves grossly intact. Moves all 4 extremities w/o difficulty. Affect pleasant     Telemetry   NSR 80-90s. Personally reviewed.   EKG    No new  tracings.   Labs   Basic Metabolic Panel: Recent Labs  Lab 05/23/18 1625 05/24/18 0330 05/25/18 0312  NA 140 140 137  K 6.1* 6.0* 4.5  CL 106 106 99  CO2 21* 22 26  GLUCOSE 123* 104* 124*  BUN 64* 74* 24*  CREATININE 8.95* 9.39* 4.95*  CALCIUM 9.1 8.8* 8.1*    Liver Function Tests: Recent Labs  Lab 05/23/18 1625  AST 29  ALT 40  ALKPHOS 119  BILITOT 0.9  PROT 7.1  ALBUMIN 3.3*   No results for input(s): LIPASE, AMYLASE in the last 168 hours. No results for input(s): AMMONIA in the last 168 hours.  CBC: Recent Labs  Lab 05/24/18 0330 05/25/18 0312  WBC 8.4 6.5  HGB 10.3* 9.7*  HCT 33.7* 30.8*  MCV 91.1 89.5  PLT 193 173    INR: Recent Labs  Lab 05/23/18 1625 05/24/18 0330 05/25/18 0312  INR 1.72 2.24 1.84    Other results:  EKG:    Imaging   Dg Chest 1 View  Result Date: 05/24/2018 CLINICAL DATA:  Status post AV shuntogram with temporary dialysis catheter and central line insertion. EXAM: CHEST  1 VIEW COMPARISON:  05/23/2018 FINDINGS: New right IJ central line catheter terminates in the mid right atrium. Temporary dialysis catheter from left IJ approach terminates in the proximal right atrium. ICD device projects over the  left axilla with leads in the right atrium and right ventricle. Left ventricular assist device is unchanged in appearance. Median sternotomy sutures are in place. No overt pulmonary edema or pneumothorax. No pulmonary consolidation. Atelectasis at the right lung base. IMPRESSION: Satisfactory support line and tube positions as above. No pneumothorax status post right IJ central line and dialysis catheter placement. Electronically Signed   By: Ashley Royalty M.D.   On: 05/24/2018 19:59   Dg Chest Port 1 View  Result Date: 05/23/2018 CLINICAL DATA:  Central venous catheter EXAM: PORTABLE CHEST 1 VIEW COMPARISON:  03/23/2018, 03/14/2018 FINDINGS: Postsurgical changes of the mediastinum. Left-sided pacing device as before. Similar  orientation of LVAD. Left IJ central venous catheter tip overlies the SVC origin. No left pneumothorax. Tiny left pleural effusion. Cardiomegaly. IMPRESSION: 1. Left IJ central venous catheter tip overlies SVC origin. No pneumothorax 2. Cardiomegaly.  Trace left pleural effusion Electronically Signed   By: Donavan Foil M.D.   On: 05/23/2018 18:42     Medications:     Scheduled Medications: . aspirin EC  81 mg Oral Daily  . atorvastatin  40 mg Oral q1800  . Chlorhexidine Gluconate Cloth  6 each Topical Q0600  . citalopram  20 mg Oral Daily  . darbepoetin (ARANESP) injection - DIALYSIS  40 mcg Intravenous Q Fri-HD  . dronabinol  2.5 mg Oral BID AC  . erythromycin  250 mg Oral TID  . feeding supplement (NEPRO CARB STEADY)  237 mL Oral BID BM  . Influenza vac split quadrivalent PF  0.5 mL Intramuscular Tomorrow-1000  . insulin aspart  5 Units Subcutaneous TID WC  . insulin glargine  10 Units Subcutaneous Daily  . isosorbide-hydrALAZINE  1.5 tablet Oral 3 times per day on Sun Tue Thu Sat  . metoCLOPramide  10 mg Oral TID  . pantoprazole  40 mg Oral BID    Infusions: . sodium chloride    . heparin 1,200 Units/hr (05/25/18 0600)    PRN Medications: sodium chloride, acetaminophen, docusate sodium, lidocaine-prilocaine, LORazepam, ondansetron (ZOFRAN) IV, pentafluoroprop-tetrafluoroeth, promethazine   Patient Profile   [name] a 52 y.o.malewith history of CAD s/p anterior MI in 2010 and NSTEMI in 3/18 with DES to pRCA and mLAD and ischemic cardiomyopathy now s/p Heartmate 3 LVAD and ESRD He was admitted in 10/18 and Heartmate 3 LVAD was placed. He had baseline CKD stage 3 likely from diabetic nephropathy and CHF. He developed peri-op AKI and ended up on dialysis. No renal recovery to date.  Admitted with malfunctioning AVF. Nephrology recommended admit.   Assessment/Plan:    1. ESRD--unable to use AVF at dialysis today.  Nephrology following Unable to fix AVF  yesterday with shuntogram Planning for new AVF to LUE on Monday. Vein mapping ordered for today Currently dialyzing through tunneled HD cath. 2.Chronic Systolic Heart Failure with HMIII VAD, St Jude ICD  Volume managed with dialysis. Stable today. - LDH stable 191 - Continue bidil on non dialysis days.  - Continue asa 81 mg daily.  - Coumadin on hold for vascular access. On heparin drip. INR 1.84 3. HTN - MAPs 80-90. Typically gets bidil 1.5 tabs TID on non HD days. Follow. Continue bidil on non hd days.   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 - Coumadin on hold for AVF placement Monday. On heparin drip. INR 1.84 5. Diabetic gastroparesis: -Stable. - Continue reglan 5 mg tid/ac and Marinol.  - Continue Protonix bid.  - Continue eythromycin indefinitely.  6. DM: -  Continue home regimen. No change. 7. PAD: -Moderate bilateral disease on ABIs in 8/18. Denies claudication currently, no pedal ulcers. No change. 8. Atrial flutter: Paroxysmal. - Remains in NSR. On heparin drip while off coumadin. IRN 1.84 9. CAD  - No s/s ischemia. Continue statin and asa.  10. Hyperkalemia - K 4.5. 11. Difficulty IV access - Portcath per IR today or possibly in OR on Monday. IR will be in touch to let us know if they can place today.  Tentative plan for new LUE AVF placement 9/23. IR consulted for portacath and will either place to day if able or suggestive placing in OR when AVF is placed. They will update [place].  I reviewed the LVAD parameters from today, and compared the results to the patient's prior recorded data.  No programming changes were made.  The LVAD is functioning within specified parameters.  The patient performs LVAD self-test daily.  LVAD interrogation was negative for any significant power changes, alarms or PI events/speed drops.  LVAD equipment check completed and is in good working order.  Back-up equipment present.   LVAD education done on emergency procedures  and precautions and reviewed exit site care.  Addendum: IR unable to place port today with INR 1.8. They would need to remove HD cath in order to place. Discussed with Dr Aundra Dubin and will hold off on port for now.   Length of Stay: [place], NP 05/25/2018, 9:15 AM  VAD Team --- VAD ISSUES ONLY--- Pager [phone] (7am - 7am)  Advanced Heart Failure Team  Pager [phone] (M-F; 7a - 4p)  Please contact [facility] for night-coverage after hours (4p -7a ) and weekends on [email or web address]  Patient seen with NP, agree with the above note.    Plan now for new LUE AVG on Monday (he will unfortunately be at high risk for graft thrombosis with non-pulsatile flow and factor V Leiden).   He currently has [facility] for dialysis.  He has right IJ CVL. We have discussed placing a port for IV access (very difficult), but per IR, would have to remove TDC to place and we are currently unable to do this.   INR is now 1.8. He is covered on heparin gtt.  MAP stable in 70s post-HD today (via [place]).  Will keep him here over weekend with plan for OR Monday morning for LUE AVG.   Rosemary Pentecost 05/25/2018 1:45 PM

## 2018-05-25 NOTE — Progress Notes (Signed)
In Progress  05/25/18 6:23 AM [place], Vida Roller, RN   Consult placed for IV team to come assess patient who is bleeding from right triple lumen IJ site. Call placed to patient's RN, Tanya, by this RN and instructed her to place a pressure dressing. Dressing should stay in place for 48 hours per protocol unless bleeding uncontrolled. Will continue to be available as needed.

## 2018-05-25 NOTE — Progress Notes (Signed)
LVAD Coordinator Rounding Note:  Direct admitted 05/23/18 from dialysis center. Dialysis center unable to access RUA fistula for dialysis treatment.  HM III LVAD implanted on 06/12/17 by Dr. Prescott Gum under Destination Therapy criteria due to renal insufficiency. Pump exchange on 03/01/18 for outflow graft twist/obstruction.   Pt lying in the bed on the phone on my arrival. Pt has already had dialysis this morning.   Vital signs: Temp: 98 HR:  97 Doppler Pressure:  72 Auto BP:  78/60 O2 Sat: 99% on RA Wt: 79.9>79.5kg  LVAD interrogation reveals:  Speed: 5500 Flow: 4.3 Power: 4.5w PI: 3.2 Alarms: none  Events:  70+ from today-this is most likely from dialysis treatment Hematocrit: 31 Fixed speed: 5500 Low speed limit: 5200  Drive Line:  Pt is currently doing every 3 days dressing changes using the daily kit with silver strip. Dressing is CDI w/anchor secure. Continue twice a week dressing changes. Next dressing change will be due 05/29/18.    Labs: LDH trend: 191>195  INR trend: 2.24>1.84  Blood Products: None this admission   Anticoagulation Plan: -INR Goal: 2.0 - 3.0-currently on Heparin drip -ASA Dose: 81 mg daily   Device: -St Jude dual  -Therapies: currently off - Monitor: on VT 150 bpm  Adverse Events on VAD: - ESRD post LVAD; started CVVH 06/15/17; HD started 06/20/17 - Hospitalization 11/5 - 07/16/17 > gastroparesis diagnosis after EGD - Hospitalization 11/26 - 08/04/17 > gastroparesis - Hospitalization 1/8 - 09/16/17>gastroparesis - referred to Dr. Corliss Parish at [facility] - 10/31/17>rightupper arm arteriovenous graft with Artegraft; ligation of right first stage brachial vein transposition  - 12/11/13 elective admission for thrombectomy for AV graft in RUA  - 12/20/17 > intractable N/V - 01/19/18 > intractable N/V sent by EMS from dialysis center - 03/01/18 > outflow graft twist/occulsion requiring pump exchange - 05/23/18 > admitted for non-working fistula in  RUA   Plan/Recommendations:  1. Please page VAD coordinator with any equipment questions or concerns. 2. VAD coordinator will accompany pt to the OR Monday morning.    [name] RN, VAD Coordinator 24/7 VAD Pager: [phone]

## 2018-05-25 NOTE — Progress Notes (Signed)
[facility]  Subjective  -no acute events overnight.  Doing well after right IJ triple-lumen placed yesterday with exchange of left IJ triple-lumen for a tunneled PermCath.  Right upper extremity fistula was occluded we did not feel this was salvageable.  CXR looks good.   Objective 98/75 (!) 59 98.4 F (36.9 C) (Oral) 20 100%  Intake/Output Summary (Last 24 hours) at 05/25/2018 1041 Last data filed at 05/25/2018 0600 Gross per 24 hour  Intake 457.48 ml  Output 1581 ml  Net -1123.52 ml    Physical exam: General: Resting in bed comfortably in dialysis Extremities: Right IJ triple-lumen, left IJ tunneled PermCath  Laboratory Lab Results: Recent Labs    05/24/18 0330 05/25/18 0312  WBC 8.4 6.5  HGB 10.3* 9.7*  HCT 33.7* 30.8*  PLT 193 173   BMET Recent Labs    05/24/18 0330 05/25/18 0312  NA 140 137  K 6.0* 4.5  CL 106 99  CO2 22 26  GLUCOSE 104* 124*  BUN 74* 24*  CREATININE 9.39* 4.95*  CALCIUM 8.8* 8.1*    COAG Lab Results  Component Value Date   INR 1.84 05/25/2018   INR 2.24 05/24/2018   INR 1.72 05/23/2018   No results found for: PTT  Assessment/Planning: Tentative plan for OR on Monday for left upper extremity graft placement.  I do not appreciate a radial or brachial pulse on exam and this may be related to his LVAD.  I have ordered a left upper extremity arterial duplex to ensure there is no underlying disease that would preclude graft placement.  Patient absolutely does not want any groin access.  Perm cath seems to be working well for now.  Marty Heck 05/25/2018 10:41 AM   --Marty Heck

## 2018-05-25 NOTE — Progress Notes (Signed)
*  Preliminary Results*  Left Upper Extremity Vein Map completed.  Cephalic Segment Diameter Depth Comment  1. Axilla 0.16cm 1.27cm   2. Mid upper arm 0.16cm 0.38cm   3. Above [facility] 0.13cm 0.59cm Branch  4. In [facility] 0.23cm 0.22cm   5. Below AC 0.18cm 0.50cm   6. Mid forearm 0.16cm 0.44cm   7. Wrist 0.18cm 0.29cm                   Basilic  Segment Diameter Depth Comment  1. Axilla 0.4cm 1.94cm   2. Mid upper arm 0.24cm 1.65cm   3. Above [facility] 0.33cm 1.32cm   4. In [facility] 0.33cm 0.91cm   5. Below AC 0.28cm 0.28cm   6. Mid forearm 0.20cm 0.23cm   7. Wrist 0.17cm 0.20cm                    Left Upper Extremity Arterial Duplex completed. All left upper extremity arteries are patent with no evidence of hemodynamically significant stenosis. Unable to evaluate phasicity due to presence of LVAD.  05/25/2018 3:06 PM Maudry Mayhew, MHA, RVT, RDCS, RDMS

## 2018-05-26 LAB — BASIC METABOLIC PANEL
Anion gap: 10 (ref 5–15)
BUN: 22 mg/dL — AB (ref 6–20)
CHLORIDE: 101 mmol/L (ref 98–111)
CO2: 28 mmol/L (ref 22–32)
CREATININE: 4.6 mg/dL — AB (ref 0.61–1.24)
Calcium: 8.5 mg/dL — ABNORMAL LOW (ref 8.9–10.3)
GFR calc Af Amer: 15 mL/min — ABNORMAL LOW (ref >60)
GFR calc non Af Amer: 13 mL/min — ABNORMAL LOW (ref >60)
GLUCOSE: 107 mg/dL — AB (ref 70–99)
POTASSIUM: 4 mmol/L (ref 3.5–5.1)
Sodium: 139 mmol/L (ref 135–145)

## 2018-05-26 LAB — CBC
HCT: 30.7 % — ABNORMAL LOW (ref 39.0–52.0)
HCT: 27.5 % — ABNORMAL LOW (ref 39.0–52.0)
HEMOGLOBIN: 9.3 g/dL — AB (ref 13.0–17.0)
Hemoglobin: 8.4 g/dL — ABNORMAL LOW (ref 13.0–17.0)
MCH: 27.8 pg (ref 26.0–34.0)
MCH: 28.1 pg (ref 26.0–34.0)
MCHC: 30.3 g/dL (ref 30.0–36.0)
MCHC: 30.5 g/dL (ref 30.0–36.0)
MCV: 91.6 fL (ref 78.0–100.0)
MCV: 92 fL (ref 78.0–100.0)
PLATELETS: 182 10*3/uL (ref 150–400)
PLATELETS: 167 10*3/uL (ref 150–400)
RBC: 3.35 MIL/uL — AB (ref 4.22–5.81)
RBC: 2.99 MIL/uL — ABNORMAL LOW (ref 4.22–5.81)
RDW: 14.8 % (ref 11.5–15.5)
RDW: 14.8 % (ref 11.5–15.5)
WBC: 8.4 10*3/uL (ref 4.0–10.5)
WBC: 8.1 10*3/uL (ref 4.0–10.5)

## 2018-05-26 LAB — GLUCOSE, CAPILLARY
GLUCOSE-CAPILLARY: 102 mg/dL — AB (ref 70–99)
Glucose-Capillary: 125 mg/dL — ABNORMAL HIGH (ref 70–99)
Glucose-Capillary: 164 mg/dL — ABNORMAL HIGH (ref 70–99)
Glucose-Capillary: 187 mg/dL — ABNORMAL HIGH (ref 70–99)

## 2018-05-26 LAB — HEPARIN LEVEL (UNFRACTIONATED): Heparin Unfractionated: 0.21 IU/mL — ABNORMAL LOW (ref 0.30–0.70)

## 2018-05-26 LAB — PROTIME-INR
INR: 1.47
Prothrombin Time: 17.7 seconds — ABNORMAL HIGH (ref 11.4–15.2)

## 2018-05-26 LAB — LACTATE DEHYDROGENASE: LDH: 184 U/L (ref 98–192)

## 2018-05-26 MED ORDER — HEPARIN (PORCINE) IN NACL 100-0.45 UNIT/ML-% IJ SOLN
1400.0000 [IU]/h | INTRAMUSCULAR | Status: DC
  Administered 2018-05-26: 1100 [IU]/h via INTRAVENOUS
  Administered 2018-05-29 – 2018-05-30 (×2): 1300 [IU]/h via INTRAVENOUS
  Administered 2018-05-31 – 2018-06-03 (×3): 1400 [IU]/h via INTRAVENOUS
  Filled 2018-05-26 (×11): qty 250

## 2018-05-26 MED ORDER — LIDOCAINE HCL (PF) 1 % IJ SOLN
INTRAMUSCULAR | Status: AC
  Administered 2018-05-26: 5 mL
  Filled 2018-05-26: qty 5

## 2018-05-26 MED ORDER — HEPARIN (PORCINE) IN NACL 100-0.45 UNIT/ML-% IJ SOLN: 1100.0000 [IU]/h | INTRAMUSCULAR | Status: DC

## 2018-05-26 MED ORDER — "THROMBI-PAD 3""X3"" EX PADS"
2.0000 | MEDICATED_PAD | Freq: Once | CUTANEOUS | Status: AC
  Administered 2018-05-26: 2 via TOPICAL
  Filled 2018-05-26: qty 2

## 2018-05-26 NOTE — Progress Notes (Signed)
Advanced Heart Failure VAD Team Note  PCP-Cardiologist: No primary care provider on file.   Subjective:     RUE AVG thrombosed, unable to restore flow. Planning to place new graft in LUE Monday.  Underwent perm cath placement 9/20.  Perm cath site leaking all night and has soaked numerous dressing.   Tolerated HD well. No CP or SOB. Feels weak.   LVAD INTERROGATION:  HeartMate 3 LVAD:   Flow 4.3 liters/min, speed 5500, power 4.0, PI 3.4 VAD interrogated personally. Parameters stable.     Objective:    Vital Signs:   Temp:  [98 F (36.7 C)-99.7 F (37.6 C)] 98.2 F (36.8 C) (09/21 1137) Pulse Rate:  [86-112] 97 (09/21 1137) Resp:  [13-28] 18 (09/21 1137) BP: (72-110)/(59-86) 72/63 (09/21 1137) SpO2:  [96 %-100 %] 98 % (09/21 1137) Weight:  [78.1 kg] 78.1 kg (09/21 0700) Last BM Date: 05/25/18 Mean arterial Pressure 70s  Intake/Output:   Intake/Output Summary (Last 24 hours) at 05/26/2018 1441 Last data filed at 05/26/2018 1200 Gross per 24 hour  Intake 1184.65 ml  Output 0 ml  Net 1184.65 ml     Physical Exam   General:  NAD.  HEENT: Normal. Neck: Supple, left IJ central line. CVP 6-7  Carotids OK. Left chest tunneled cath with persistent ooze  Cor: LVAD hum.  Lungs: Clear. Abdomen:  soft, nontender, non-distended. No hepatosplenomegaly. No bruits or masses. Good bowel sounds. Driveline site clean. Anchor in place.  Extremities: no cyanosis, clubbing, rash. Warm no edema  Neuro: alert & oriented x 3. No focal deficits. Moves all 4 without problem  -  Telemetry   NSR 80-90s. Personally reviewed.   EKG    No new tracings.   Labs   Basic Metabolic Panel: Recent Labs  Lab 05/23/18 1625 05/24/18 0330 05/25/18 0312 05/26/18 0451  NA 140 140 137 139  K 6.1* 6.0* 4.5 4.0  CL 106 106 99 101  CO2 21* 22 26 28   GLUCOSE 123* 104* 124* 107*  BUN 64* 74* 24* 22*  CREATININE 8.95* 9.39* 4.95* 4.60*  CALCIUM 9.1 8.8* 8.1* 8.5*    Liver Function  Tests: Recent Labs  Lab 05/23/18 1625  AST 29  ALT 40  ALKPHOS 119  BILITOT 0.9  PROT 7.1  ALBUMIN 3.3*   No results for input(s): LIPASE, AMYLASE in the last 168 hours. No results for input(s): AMMONIA in the last 168 hours.  CBC: Recent Labs  Lab 05/24/18 0330 05/25/18 0312 05/26/18 0451 05/26/18 1415  WBC 8.4 6.5 8.4 8.1  HGB 10.3* 9.7* 9.3* 8.4*  HCT 33.7* 30.8* 30.7* 27.5*  MCV 91.1 89.5 91.6 92.0  PLT 193 173 182 167    INR: Recent Labs  Lab 05/23/18 1625 05/24/18 0330 05/25/18 0312 05/26/18 0451  INR 1.72 2.24 1.84 1.47    Other results:     Imaging   Dg Chest 1 View  Result Date: 05/24/2018 CLINICAL DATA:  Status post AV shuntogram with temporary dialysis catheter and central line insertion. EXAM: CHEST  1 VIEW COMPARISON:  05/23/2018 FINDINGS: New right IJ central line catheter terminates in the mid right atrium. Temporary dialysis catheter from left IJ approach terminates in the proximal right atrium. ICD device projects over the left axilla with leads in the right atrium and right ventricle. Left ventricular assist device is unchanged in appearance. Median sternotomy sutures are in place. No overt pulmonary edema or pneumothorax. No pulmonary consolidation. Atelectasis at the right lung base. IMPRESSION: Satisfactory support  line and tube positions as above. No pneumothorax status post right IJ central line and dialysis catheter placement. Electronically Signed   By: Ashley Royalty M.D.   On: 05/24/2018 19:59     Medications:     Scheduled Medications: . aspirin EC  81 mg Oral Daily  . atorvastatin  40 mg Oral q1800  . Chlorhexidine Gluconate Cloth  6 each Topical Q0600  . citalopram  20 mg Oral Daily  . darbepoetin (ARANESP) injection - DIALYSIS  40 mcg Intravenous Q Fri-HD  . dronabinol  2.5 mg Oral BID AC  . erythromycin  250 mg Oral TID  . feeding supplement (NEPRO CARB STEADY)  237 mL Oral BID BM  . Influenza vac split quadrivalent PF  0.5  mL Intramuscular Tomorrow-1000  . insulin aspart  5 Units Subcutaneous TID WC  . insulin glargine  10 Units Subcutaneous Daily  . isosorbide-hydrALAZINE  1.5 tablet Oral 3 times per day on Sun Tue Thu Sat  . metoCLOPramide  10 mg Oral TID  . pantoprazole  40 mg Oral BID    Infusions: . heparin 900 Units/hr (05/26/18 0636)    PRN Medications: acetaminophen, docusate sodium, LORazepam, ondansetron (ZOFRAN) IV, promethazine   Patient Profile   [name] McLeanis a 52 y.o.malewith history of CAD s/p anterior MI in 2010 and NSTEMI in 3/18 with DES to pRCA and mLAD and ischemic cardiomyopathy now s/p Heartmate 3 LVAD and ESRD He was admitted in 10/18 and Heartmate 3 LVAD was placed. He had baseline CKD stage 3 likely from diabetic nephropathy and CHF. He developed peri-op AKI and ended up on dialysis. No renal recovery to date.  Admitted with malfunctioning AVF. Nephrology recommended admit.   Assessment/Plan:    1. ESRD with clotted AVF - Unable to unclot AVF in cath lab - Planning for new AVF to LUE on Monday. Vein mapping ordered - Currently dialyzing through tunneled HD cath. Tolerating well. Nephrology following - Site with persistent ooze. I placed 2 stitches and held manual pressure and bleeding resolved. D/w Dr. Trula Slade 2.Chronic Systolic Heart Failure with HMIII VAD, St Jude ICD  - Volume managed with dialysis. Looks good.  - LDH stable 184 - Continue bidil on non dialysis days.  - Continue asa 81 mg daily.  - Coumadin on hold for vascular access. On heparin drip. INR 1.47. Restart coumadin after AVF placement 3. HTN - MAPs low 70s with bleeding. Typically gets bidil 1.5 tabs TID on non HD days. Follow. Can hold as needed Continue bidil on non hd days.  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 - Coumadin on hold for AVF placement Monday. On heparin drip. INR 1.47 5. Diabetic gastroparesis: -Stable. - Continue reglan 5 mg tid/ac and Marinol.  -  Continue Protonix bid.  - Continue eythromycin indefinitely.  6. DM: - Continue home regimen. No change. 7. PAD: -Moderate bilateral disease on ABIs in 8/18. Denies claudication currently, no pedal ulcers. No change. 8. Atrial flutter: Paroxysmal. - Remains in NSR. On heparin drip while off coumadin. IRN 1.84 9. CAD  - No s/s ischemia. - Continue statin and asa.  10. Hyperkalemia - K 4.50 11. Difficulty IV access  - Pending possible portcath placement (likely in OR)    I reviewed the LVAD parameters from today, and compared the results to the patient's prior recorded data.  No programming changes were made.  The LVAD is functioning within specified parameters.  The patient performs LVAD self-test daily.  LVAD interrogation was  negative for any significant power changes, alarms or PI events/speed drops.  LVAD equipment check completed and is in good working order.  Back-up equipment present.   LVAD education done on emergency procedures and precautions and reviewed exit site care.   Length of Stay: 3  [name], MD 05/26/2018, 2:41 PM  VAD Team --- VAD ISSUES ONLY--- Pager [phone] (7am - 7am)  Advanced Heart Failure Team  Pager [phone] (M-F; 7a - 4p)  Please contact [facility] for night-coverage after hours (4p -7a ) and weekends on [email or web address]

## 2018-05-26 NOTE — Progress Notes (Signed)
Patient with oozing from left HD cath and dressing changes twice. Surgery Gel wrapped around site by Chip from PACU and Waunita Schooner from [facility], IV team in for consult as well. VAD coordinator notified and orders were given to use whatever [facility] has for bleeds. Surgery Gel, Thrombi pads and Ultra foam on top with 4x4's and taped down tightly. RX notified of Heparin gtt and rate of infusion, held Heparin for 2 hours, drew labs and patient instructed to lay still and see if we can get bleeding to stop. Central line dressing changed on 05/26/2018 under sterile technique and Surgery Gel wrapped around central line under 4x4 and external dressing protector. Patient tolerated procedure well and he understands to call if he feel blood oozing from sites. Drive line dressing tape changed as well. Will continue to monitor closely.

## 2018-05-26 NOTE — Progress Notes (Addendum)
ANTICOAGULATION CONSULT NOTE  Pharmacy Consult:  Heparin Indication:  LVAD  Allergies  Allergen Reactions  . Metformin And Related Diarrhea    Patient Measurements: Height: 5' 8"  (172.7 cm) Weight: 175 lb 4.3 oz (79.5 kg) IBW/kg (Calculated) : 68.4 Heparin Dosing Weight: 80 kg  Vital Signs: Temp: 99.7 F (37.6 C) (09/21 0000) Temp Source: Axillary (09/21 0000) BP: 97/77 (09/21 0000) Pulse Rate: 111 (09/20 2000)  Labs: Recent Labs    05/23/18 1625 05/24/18 0330 05/24/18 1252 05/24/18 2326 05/25/18 0312 05/25/18 1003  HGB  --  10.3*  --   --  9.7*  --   HCT  --  33.7*  --   --  30.8*  --   PLT  --  193  --   --  173  --   LABPROT 20.0* 24.6*  --   --  21.1*  --   INR 1.72 2.24  --   --  1.84  --   HEPARINUNFRC  --   --  <0.10* 0.76*  --  0.61  CREATININE 8.95* 9.39*  --   --  4.95*  --     Estimated Creatinine Clearance: 16.7 mL/min (A) (by C-G formula based on SCr of 4.95 mg/dL (H)).  Assessment: 20 YOM admitted because he was unable to get dialysis due to catheter malfunction. Patient has a hxLVAD and Factor V Leiden on Coumadin PTA.  Pharmacy consulted to initiate IV heparin bridge while warfarin on hold.  INR was sub-therapeutic at 1.72 on admission.   Underwent RUE fistulogram on 9/19. - for plans to new HD access when INR < 1.5  RN reports bleeding from HD cath this am.   Goal of Therapy:  Heparin level 0.3-0.7 units/ml Monitor platelets by anticoagulation protocol: Yes   Plan:  Hold heparin for 30 min and resume at 1100 units/hr Check heparin level 6 hours after restart  Yazleemar Strassner Pharm.D. Clinical Pharmacist 05/26/2018 4:20 AM   Addum:  Still bleeding from HD cath.  Controlled bleeding from CL.  Heparin restarted at 900 units/hr.  Check heparin level later today

## 2018-05-26 NOTE — Progress Notes (Signed)
Patient bleeding from HD cath, VVS and Bensimhon notified. RN redressed and held pressure on cath x2, prepped room for Bensimhon to put in stitch.

## 2018-05-26 NOTE — Progress Notes (Signed)
[facility] Progress Note   Subjective:  Having bleeding from recently placed TDC.  No other c/o, no issues on HD yest.   Objective Vitals:   05/26/18 0000 05/26/18 0400 05/26/18 0700 05/26/18 0825  BP: 97/77 97/71  110/86  Pulse:  90 86 98  Resp: 20 14 18 13   Temp: 99.7 F (37.6 C) 98.7 F (37.1 C)  98 F (36.7 C)  TempSrc: Axillary Oral  Oral  SpO2: 96% 99% 100% 100%  Weight:   78.1 kg   Height:       Physical Exam General: Well appearing man, NAD. Heart: Continuous LVAD hum Lungs: CTA anteriorly Extremities: No LE edema Dialysis Access: [facility]  Additional Objective Labs: Basic Metabolic Panel: Recent Labs  Lab 05/24/18 0330 05/25/18 0312 05/26/18 0451  NA 140 137 139  K 6.0* 4.5 4.0  CL 106 99 101  CO2 22 26 28   GLUCOSE 104* 124* 107*  BUN 74* 24* 22*  CREATININE 9.39* 4.95* 4.60*  CALCIUM 8.8* 8.1* 8.5*   Liver Function Tests: Recent Labs  Lab 05/23/18 1625  AST 29  ALT 40  ALKPHOS 119  BILITOT 0.9  PROT 7.1  ALBUMIN 3.3*   CBC: Recent Labs  Lab 05/24/18 0330 05/25/18 0312 05/26/18 0451  WBC 8.4 6.5 8.4  HGB 10.3* 9.7* 9.3*  HCT 33.7* 30.8* 30.7*  MCV 91.1 89.5 91.6  PLT 193 173 182   CBG: Recent Labs  Lab 05/24/18 2117 05/25/18 1245 05/25/18 1659 05/25/18 2157 05/26/18 0823  GLUCAP 123* 172* 192* 195* 102*   Studies/Results: Dg Chest 1 View  Result Date: 05/24/2018 CLINICAL DATA:  Status post AV shuntogram with temporary dialysis catheter and central line insertion. EXAM: CHEST  1 VIEW COMPARISON:  05/23/2018 FINDINGS: New right IJ central line catheter terminates in the mid right atrium. Temporary dialysis catheter from left IJ approach terminates in the proximal right atrium. ICD device projects over the left axilla with leads in the right atrium and right ventricle. Left ventricular assist device is unchanged in appearance. Median sternotomy sutures are in place. No overt pulmonary edema or pneumothorax. No  pulmonary consolidation. Atelectasis at the right lung base. IMPRESSION: Satisfactory support line and tube positions as above. No pneumothorax status post right IJ central line and dialysis catheter placement. Electronically Signed   By: Ashley Royalty M.D.   On: 05/24/2018 19:59   Medications: . heparin 900 Units/hr (05/26/18 0636)   . aspirin EC  81 mg Oral Daily  . atorvastatin  40 mg Oral q1800  . Chlorhexidine Gluconate Cloth  6 each Topical Q0600  . citalopram  20 mg Oral Daily  . darbepoetin (ARANESP) injection - DIALYSIS  40 mcg Intravenous Q Fri-HD  . dronabinol  2.5 mg Oral BID AC  . erythromycin  250 mg Oral TID  . feeding supplement (NEPRO CARB STEADY)  237 mL Oral BID BM  . Influenza vac split quadrivalent PF  0.5 mL Intramuscular Tomorrow-1000  . insulin aspart  5 Units Subcutaneous TID WC  . insulin glargine  10 Units Subcutaneous Daily  . isosorbide-hydrALAZINE  1.5 tablet Oral 3 times per day on Sun Tue Thu Sat  . metoCLOPramide  10 mg Oral TID  . pantoprazole  40 mg Oral BID    Dialysis Orders: MWF [facility] 4h 56mn 78.5kg (raising edw to ~80 kg here) 3k/2Ca bath RUA AVG Hep none - calc 0.5ug tiw - no ESA  Assessment/Plan: 1. Clotted R arm AVG: Placed in Feb  2019, simple declot in April 2019. VVS consulted took to OR on 9/19, could not salvage AVG.  TDC placed L IJ. Tentative plan is for new LUE AVG placement early next week. 2. ESRD: HD MWF.  Under dry, no UF last HD on Friday.  HD Monday 3. CAD/severe ICM/ sp LVAD 4. Gastroparesis: On multiple meds. Stable this admit. 5. HTN - bidil , per cards 6. Volume - hasn't been able to get to dry wt, will ^edw to around 80kg. 7. Anemia of ESRD: Hgb 9.7, will start ESA today (Aranesp 47mg weekly). 8. Secondary Hyperparathyroidism: Ca ok, no binders. Resume calcitriol. 9. Nutrition: Alb low, will start Nepro supplements.   RKelly SplinterMD CNewell Rubbermaidpager [phone]  05/26/2018, 10:20  AM

## 2018-05-26 NOTE — Progress Notes (Signed)
ANTICOAGULATION CONSULT NOTE  Pharmacy Consult:  Heparin Indication:  LVAD  Allergies  Allergen Reactions  . Metformin And Related Diarrhea    Patient Measurements: Height: 5' 8"  (172.7 cm) Weight: 172 lb 2.9 oz (78.1 kg) IBW/kg (Calculated) : 68.4 Heparin Dosing Weight: 80 kg  Vital Signs: Temp: 98 F (36.7 C) (09/21 1515) Temp Source: Oral (09/21 1515) BP: 96/62 (09/21 1515) Pulse Rate: 99 (09/21 1515)  Labs: Recent Labs    05/24/18 0330  05/24/18 2326 05/25/18 0312 05/25/18 1003 05/26/18 0451 05/26/18 1415  HGB 10.3*  --   --  9.7*  --  9.3* 8.4*  HCT 33.7*  --   --  30.8*  --  30.7* 27.5*  PLT 193  --   --  173  --  182 167  LABPROT 24.6*  --   --  21.1*  --  17.7*  --   INR 2.24  --   --  1.84  --  1.47  --   HEPARINUNFRC  --    < > 0.76*  --  0.61  --  0.21*  CREATININE 9.39*  --   --  4.95*  --  4.60*  --    < > = values in this interval not displayed.    Estimated Creatinine Clearance: 18 mL/min (A) (by C-G formula based on SCr of 4.6 mg/dL (H)).  Assessment: 15 YOM admitted because he was unable to get dialysis due to catheter malfunction. Patient has a hxLVAD HM3 s/p exchange  and Factor V Leiden on Coumadin PTA.  Pharmacy consulted to initiate IV heparin bridge while warfarin on hold for per HD access when INR < 1.5.  INR was sub-therapeutic at 1.72 on admission.  Heparin drip turned off overnight with continued oozing from HD cath site.  Heparin restarted this am at lower rate 900 uts/hr HL 0.2 < goal.   HD cath sutured and bleeding stopped.  Slight drop in h/h will watch.   Goal of Therapy:  Heparin level 0.3-0.7 units/ml Monitor platelets by anticoagulation protocol: Yes   Plan:  Increase heparin 1100 uts/hr  Daily HL, CBC  Bonnita Nasuti Pharm.D. CPP, BCPS Clinical Pharmacist [phone] 05/26/2018 3:58 PM

## 2018-05-27 ENCOUNTER — Inpatient Hospital Stay (HOSPITAL_COMMUNITY): Payer: BLUE CROSS/BLUE SHIELD

## 2018-05-27 LAB — HEPARIN LEVEL (UNFRACTIONATED): HEPARIN UNFRACTIONATED: 0.2 [IU]/mL — AB (ref 0.30–0.70)

## 2018-05-27 LAB — BASIC METABOLIC PANEL
Anion gap: 12 (ref 5–15)
BUN: 35 mg/dL — AB (ref 6–20)
CHLORIDE: 100 mmol/L (ref 98–111)
CO2: 25 mmol/L (ref 22–32)
Calcium: 8.2 mg/dL — ABNORMAL LOW (ref 8.9–10.3)
Creatinine, Ser: 7.18 mg/dL — ABNORMAL HIGH (ref 0.61–1.24)
GFR calc Af Amer: 9 mL/min — ABNORMAL LOW (ref >60)
GFR calc non Af Amer: 8 mL/min — ABNORMAL LOW (ref >60)
Glucose, Bld: 112 mg/dL — ABNORMAL HIGH (ref 70–99)
POTASSIUM: 3.7 mmol/L (ref 3.5–5.1)
Sodium: 137 mmol/L (ref 135–145)

## 2018-05-27 LAB — CBC
HCT: 24.4 % — ABNORMAL LOW (ref 39.0–52.0)
Hemoglobin: 7.5 g/dL — ABNORMAL LOW (ref 13.0–17.0)
MCH: 27.9 pg (ref 26.0–34.0)
MCHC: 30.7 g/dL (ref 30.0–36.0)
MCV: 90.7 fL (ref 78.0–100.0)
Platelets: 163 10*3/uL (ref 150–400)
RBC: 2.69 MIL/uL — AB (ref 4.22–5.81)
RDW: 14.8 % (ref 11.5–15.5)
WBC: 9.4 10*3/uL (ref 4.0–10.5)

## 2018-05-27 LAB — GLUCOSE, CAPILLARY
GLUCOSE-CAPILLARY: 109 mg/dL — AB (ref 70–99)
Glucose-Capillary: 178 mg/dL — ABNORMAL HIGH (ref 70–99)
Glucose-Capillary: 130 mg/dL — ABNORMAL HIGH (ref 70–99)

## 2018-05-27 LAB — PROTIME-INR
INR: 1.31
Prothrombin Time: 16.1 seconds — ABNORMAL HIGH (ref 11.4–15.2)

## 2018-05-27 LAB — LACTATE DEHYDROGENASE: LDH: 158 U/L (ref 98–192)

## 2018-05-27 MED ORDER — PIPERACILLIN-TAZOBACTAM 3.375 G IVPB 30 MIN
3.3750 g | Freq: Once | INTRAVENOUS | Status: AC
  Administered 2018-05-27: 3.375 g via INTRAVENOUS
  Filled 2018-05-27: qty 50

## 2018-05-27 MED ORDER — CEFAZOLIN SODIUM-DEXTROSE 1-4 GM/50ML-% IV SOLN
1.0000 g | INTRAVENOUS | Status: AC
  Filled 2018-05-27: qty 50

## 2018-05-27 MED ORDER — VANCOMYCIN HCL 10 G IV SOLR
1750.0000 mg | Freq: Once | INTRAVENOUS | Status: AC
  Administered 2018-05-27: 1750 mg via INTRAVENOUS
  Filled 2018-05-27: qty 1750

## 2018-05-27 MED ORDER — CHLORHEXIDINE GLUCONATE CLOTH 2 % EX PADS
6.0000 | MEDICATED_PAD | Freq: Every day | CUTANEOUS | Status: DC
  Administered 2018-05-27 – 2018-05-29 (×2): 6 via TOPICAL

## 2018-05-27 MED ORDER — VANCOMYCIN VARIABLE DOSE PER UNSTABLE RENAL FUNCTION (PHARMACIST DOSING): Status: DC

## 2018-05-27 MED ORDER — VANCOMYCIN HCL IN DEXTROSE 750-5 MG/150ML-% IV SOLN: 750.0000 mg | INTRAVENOUS | Status: DC

## 2018-05-27 MED ORDER — PIPERACILLIN-TAZOBACTAM 3.375 G IVPB
3.3750 g | Freq: Two times a day (BID) | INTRAVENOUS | Status: AC
  Administered 2018-05-28 – 2018-05-30 (×5): 3.375 g via INTRAVENOUS
  Filled 2018-05-27 (×6): qty 50

## 2018-05-27 NOTE — Progress Notes (Signed)
ANTICOAGULATION CONSULT NOTE  Pharmacy Consult:  Heparin > warfarin Indication:  LVAD  Allergies  Allergen Reactions  . Metformin And Related Diarrhea    Patient Measurements: Height: 5' 8"  (172.7 cm) Weight: 179 lb 0.2 oz (81.2 kg) IBW/kg (Calculated) : 68.4 Heparin Dosing Weight: 80 kg  Vital Signs: Temp: 98.3 F (36.8 C) (09/22 1113) Temp Source: Oral (09/22 1113) BP: 94/72 (09/22 1113) Pulse Rate: 97 (09/22 1113)  Labs: Recent Labs    05/25/18 0312 05/25/18 1003 05/26/18 0451 05/26/18 1415 05/27/18 0400  HGB 9.7*  --  9.3* 8.4* 7.5*  HCT 30.8*  --  30.7* 27.5* 24.4*  PLT 173  --  182 167 163  LABPROT 21.1*  --  17.7*  --  16.1*  INR 1.84  --  1.47  --  1.31  HEPARINUNFRC  --  0.61  --  0.21* 0.20*  CREATININE 4.95*  --  4.60*  --  7.18*    Estimated Creatinine Clearance: 11.5 mL/min (A) (by C-G formula based on SCr of 7.18 mg/dL (H)).  Assessment: 62 YOM admitted because he was unable to get dialysis due to catheter malfunction. Patient has a hxLVAD HM3 s/p exchange  and Factor V Leiden on Coumadin PTA.  Pharmacy consulted to initiate IV heparin bridge while warfarin on hold for per HD access when INR < 1.5.  INR was sub-therapeutic at 1.72 on admission. INR 1.3 today - warfarin on hold until after AVG 9/23  Heparin drip turned off 9/21 overnight  with continued oozing from HD cath site.  Heparin restarted at lower rate 900 uts/hr HL 0.2 < goal.   HD cath sutured and bleeding stopped.  Heparin drip increased 1100 uts/hr HL remains low 0.2 - and hgb dropped further 10>7.5 - discussed with MD will target low end therapeutic 0.3-0.4ish give pump clot history.     Goal of Therapy:  Heparin level 0.3-0.7 units/ml  INR 2-3 Monitor platelets by anticoagulation protocol: Yes   Plan:  Increase heparin 1200 uts/hr  Daily HL, CBC, INR  Bonnita Nasuti Pharm.D. CPP, BCPS Clinical Pharmacist [phone] 05/27/2018 12:14 PM

## 2018-05-27 NOTE — Progress Notes (Addendum)
Pharmacy Antibiotic Note  Rahkim E Krogh is a 52 y.o. male admitted on 05/23/2018 with sepsis.  Pharmacy has been consulted for vanc/zosyn dosing.  LVAD pt who is here for several different issues. He has started to have some fevers. Team has decided to use vanc and zosyn to cover empirically. Blood cx have been sent. He is an ESRD pt with HD schedule of MWF. Will give load today and maintenance dose when AVF is placed. However, we are finding out that he only has one line and vanc is incompatible with IV heparin. Rn will check with renal to see if the vanc can be infuse through the HD port.   Plan: Vanc 1735m x1 Zosyn 3.375g IV x1 30 min infusion then q12 extended infusion   Height: 5' 8"  (172.7 cm) Weight: 179 lb 0.2 oz (81.2 kg) IBW/kg (Calculated) : 68.4  Temp (24hrs), Avg:99.3 F (37.4 C), Min:98 F (36.7 C), Max:100.4 F (38 C)  Recent Labs  Lab 05/23/18 1625 05/24/18 0330 05/25/18 0312 05/26/18 0451 05/26/18 1415 05/27/18 0400  WBC  --  8.4 6.5 8.4 8.1 9.4  CREATININE 8.95* 9.39* 4.95* 4.60*  --  7.18*    Estimated Creatinine Clearance: 11.5 mL/min (A) (by C-G formula based on SCr of 7.18 mg/dL (H)).    Allergies  Allergen Reactions  . Metformin And Related Diarrhea    Antimicrobials this admission: 9/22 daptomycin x1 9/22 zosyn >>   Dose adjustments this admission:   Microbiology results: 9/22 BCx:  UCx:  Sputum: 9/18 MRSA PCR: neg  MOnnie Boer PharmD, BCIDP, AAHIVP, CPP Infectious Disease Pharmacist Pager: [phone]/22/2019 9:19 PM

## 2018-05-27 NOTE — Progress Notes (Signed)
Called VAD Coordinator about patient;s 100.3 oral fever. Talked with Clarise Cruz, who stated she would call Dr. Aundra Dubin to advise him. Waiting to hear back from her to see if blood cultures are needed. WBC's trending up. Tylenol given ofr fever. Patient aware he has a fever.

## 2018-05-27 NOTE — Progress Notes (Signed)
[facility] Progress Note   Subjective:  No further bleeding, Hb 7.5 this am, pt alert and /wo complaints.   Objective Vitals:   05/26/18 2322 05/27/18 0000 05/27/18 0343 05/27/18 0752  BP: 93/83  93/80 115/84  Pulse:   (!) 108 95  Resp:  (!) 21 (!) 26 20  Temp: 99.6 F (37.6 C)  (!) 100.4 F (38 C) 98 F (36.7 C)  TempSrc: Oral  Oral Oral  SpO2:   100%   Weight:   81.2 kg   Height:       Physical Exam General: Well appearing man, NAD. Heart: Continuous LVAD hum Lungs: CTA anteriorly Extremities: No LE edema Dialysis Access: New L IJ [facility]  Additional Objective Labs: Basic Metabolic Panel: Recent Labs  Lab 05/25/18 0312 05/26/18 0451 05/27/18 0400  NA 137 139 137  K 4.5 4.0 3.7  CL 99 101 100  CO2 26 28 25   GLUCOSE 124* 107* 112*  BUN 24* 22* 35*  CREATININE 4.95* 4.60* 7.18*  CALCIUM 8.1* 8.5* 8.2*   Liver Function Tests: Recent Labs  Lab 05/23/18 1625  AST 29  ALT 40  ALKPHOS 119  BILITOT 0.9  PROT 7.1  ALBUMIN 3.3*   CBC: Recent Labs  Lab 05/24/18 0330 05/25/18 0312 05/26/18 0451 05/26/18 1415 05/27/18 0400  WBC 8.4 6.5 8.4 8.1 9.4  HGB 10.3* 9.7* 9.3* 8.4* 7.5*  HCT 33.7* 30.8* 30.7* 27.5* 24.4*  MCV 91.1 89.5 91.6 92.0 90.7  PLT 193 173 182 167 163   CBG: Recent Labs  Lab 05/26/18 0823 05/26/18 1217 05/26/18 1630 05/26/18 2226 05/27/18 0811  GLUCAP 102* 125* 164* 187* 109*   Studies/Results: No results found. Medications: . [START ON 05/28/2018]  ceFAZolin (ANCEF) IV    . heparin 1,100 Units/hr (05/27/18 0000)   . aspirin EC  81 mg Oral Daily  . atorvastatin  40 mg Oral q1800  . Chlorhexidine Gluconate Cloth  6 each Topical Q0600  . citalopram  20 mg Oral Daily  . darbepoetin (ARANESP) injection - DIALYSIS  40 mcg Intravenous Q Fri-HD  . dronabinol  2.5 mg Oral BID AC  . erythromycin  250 mg Oral TID  . feeding supplement (NEPRO CARB STEADY)  237 mL Oral BID BM  . Influenza vac split quadrivalent PF  0.5  mL Intramuscular Tomorrow-1000  . insulin aspart  5 Units Subcutaneous TID WC  . insulin glargine  10 Units Subcutaneous Daily  . isosorbide-hydrALAZINE  1.5 tablet Oral 3 times per day on Sun Tue Thu Sat  . metoCLOPramide  10 mg Oral TID  . pantoprazole  40 mg Oral BID    Dialysis Orders: MWF [facility] 4h 36mn 78.5kg (raising edw to ~80 kg here) 3k/2Ca bath RUA AVG Hep none - calc 0.5ug tiw - no ESA  Assessment/Plan: 1. Clotted R arm AVG: went to OR 9/19, clotted AVG unable to salvage due to severely atretic draining veins.  L IJ TDC. Tentative plan is for new LUE AVG/AVF tomorrow. Will sched HD around the procedure. 2. ESRD: HD MWF.  Raising edw to approx 80kg. Stable. HD tomorrow 3. CAD/severe ICM/ sp LVAD 4. Gastroparesis: On multiple meds. Stable this admit. 5. HTN - bidil , per cards  6. Anemia of ESRD: Hgb 9.7, will start ESA today (Aranesp 448m weekly). 7. Secondary Hyperparathyroidism: Ca ok, no binders. Resume calcitriol. 8. Nutrition: Alb low, will start Nepro supplements.   RoKelly SplinterD CaNewell Rubbermaidager [phone] 05/27/2018, 10:13 AM

## 2018-05-27 NOTE — Progress Notes (Signed)
Bleeding from catheter stopped.  Appreciate Dr. Clayborne Dana assistance  Segment Diameter Depth Comment  1. Axilla 0.16cm 1.27cm   2. Mid upper arm 0.16cm 0.38cm   3. Above [facility] 0.13cm 0.59cm Branch  4. In [facility] 0.23cm 0.22cm   5. Below AC 0.18cm 0.50cm   6. Mid forearm 0.16cm 0.44cm   7. Wrist 0.18cm 0.29cm                   Basilic  Segment Diameter Depth Comment  1. Axilla 0.4cm 1.94cm   2. Mid upper arm 0.24cm 1.65cm   3. Above [facility] 0.33cm 1.32cm   4. In [facility] 0.33cm 0.91cm   5. Below AC 0.28cm 0.28cm   6. Mid forearm 0.20cm 0.23cm   7. Wrist 0.17cm 0.20cm                   Plan for AVF vs AVGG tomorrow by Dr. Carlis Abbott INR 1.31 NPO after midnight  Wells Brabham

## 2018-05-27 NOTE — Progress Notes (Signed)
Patient ID: [name], male   DOB: [birth year], 52 y.o.   MRN: [id number]   Advanced Heart Failure VAD Team Note  PCP-Cardiologist: No primary care provider on file.   Subjective:     RUE AVG thrombosed, unable to restore flow. Planning to place new graft in LUE Monday.  Underwent perm cath placement 9/20.  Oozing from site now resolved s/p stitches.   No complaints today.  Hgb down to 7.5.    LVAD INTERROGATION:  HeartMate 3 LVAD:   Flow 4.2 liters/min, speed 5500, power 4.3, PI 3.6.  6-7 PI events. VAD interrogated personally. Parameters stable.     Objective:    Vital Signs:   Temp:  [98 F (36.7 C)-100.4 F (38 C)] 98 F (36.7 C) (09/22 0752) Pulse Rate:  [95-108] 95 (09/22 0752) Resp:  [16-26] 20 (09/22 0752) BP: (72-115)/(62-84) 115/84 (09/22 0752) SpO2:  [98 %-100 %] 100 % (09/22 0343) Weight:  [81.2 kg] 81.2 kg (09/22 0343) Last BM Date: 05/25/18 Mean arterial Pressure 80s-90s  Intake/Output:   Intake/Output Summary (Last 24 hours) at 05/27/2018 0928 Last data filed at 05/27/2018 0700 Gross per 24 hour  Intake 951.79 ml  Output 0 ml  Net 951.79 ml     Physical Exam   General: Well appearing this am. NAD.  HEENT: Normal. Neck: Supple, JVP 7-8 cm. Carotids OK.  Cardiac:  Mechanical heart sounds with LVAD hum present.  Lungs:  CTAB, normal effort.  Abdomen:  NT, ND, no HSM. No bruits or masses. +BS  LVAD exit site: Well-healed and incorporated. Dressing dry and intact. No erythema or drainage. Stabilization device present and accurately applied. Driveline dressing changed daily per sterile technique. Extremities:  Warm and dry. No cyanosis, clubbing, rash, or edema.  Neuro:  Alert & oriented x 3. Cranial nerves grossly intact. Moves all 4 extremities w/o difficulty. Affect pleasant     Telemetry   NSR 80-90s. Personally reviewed.   EKG    No new tracings.   Labs   Basic Metabolic Panel: Recent Labs  Lab 05/23/18 1625 05/24/18 0330  05/25/18 0312 05/26/18 0451 05/27/18 0400  NA 140 140 137 139 137  K 6.1* 6.0* 4.5 4.0 3.7  CL 106 106 99 101 100  CO2 21* 22 26 28 25   GLUCOSE 123* 104* 124* 107* 112*  BUN 64* 74* 24* 22* 35*  CREATININE 8.95* 9.39* 4.95* 4.60* 7.18*  CALCIUM 9.1 8.8* 8.1* 8.5* 8.2*    Liver Function Tests: Recent Labs  Lab 05/23/18 1625  AST 29  ALT 40  ALKPHOS 119  BILITOT 0.9  PROT 7.1  ALBUMIN 3.3*   No results for input(s): LIPASE, AMYLASE in the last 168 hours. No results for input(s): AMMONIA in the last 168 hours.  CBC: Recent Labs  Lab 05/24/18 0330 05/25/18 0312 05/26/18 0451 05/26/18 1415 05/27/18 0400  WBC 8.4 6.5 8.4 8.1 9.4  HGB 10.3* 9.7* 9.3* 8.4* 7.5*  HCT 33.7* 30.8* 30.7* 27.5* 24.4*  MCV 91.1 89.5 91.6 92.0 90.7  PLT 193 173 182 167 163    INR: Recent Labs  Lab 05/23/18 1625 05/24/18 0330 05/25/18 0312 05/26/18 0451 05/27/18 0400  INR 1.72 2.24 1.84 1.47 1.31    Other results:     Imaging   No results found.   Medications:     Scheduled Medications: . aspirin EC  81 mg Oral Daily  . atorvastatin  40 mg Oral q1800  . Chlorhexidine Gluconate Cloth  6 each Topical Q0600  .  citalopram  20 mg Oral Daily  . darbepoetin (ARANESP) injection - DIALYSIS  40 mcg Intravenous Q Fri-HD  . dronabinol  2.5 mg Oral BID AC  . erythromycin  250 mg Oral TID  . feeding supplement (NEPRO CARB STEADY)  237 mL Oral BID BM  . Influenza vac split quadrivalent PF  0.5 mL Intramuscular Tomorrow-1000  . insulin aspart  5 Units Subcutaneous TID WC  . insulin glargine  10 Units Subcutaneous Daily  . isosorbide-hydrALAZINE  1.5 tablet Oral 3 times per day on Sun Tue Thu Sat  . metoCLOPramide  10 mg Oral TID  . pantoprazole  40 mg Oral BID    Infusions: . [START ON 05/28/2018]  ceFAZolin (ANCEF) IV    . heparin 1,100 Units/hr (05/27/18 0000)    PRN Medications: acetaminophen, docusate sodium, LORazepam, ondansetron (ZOFRAN) IV, promethazine   Patient  Profile   Juston E McLeanis a 52 y.o.malewith history of CAD s/p anterior MI in 2010 and NSTEMI in 3/18 with DES to pRCA and mLAD and ischemic cardiomyopathy now s/p Heartmate 3 LVAD and ESRD He was admitted in 10/18 and Heartmate 3 LVAD was placed. He had baseline CKD stage 3 likely from diabetic nephropathy and CHF. He developed peri-op AKI and ended up on dialysis. No renal recovery to date.  Admitted with malfunctioning AVF. Nephrology recommended admit.   Assessment/Plan:    1. ESRD with clotted AVF: Unable to unclot AVF in cath lab.  Planned for placement of LUE AV graft or fistula tomorrow morning. No further oozing at tunneled catheter site.  - Currently dialyzing through tunneled HD cath. Tolerating well. Nephrology following 2.Chronic Systolic Heart Failure with HMIII VAD, St Jude ICD. Volume managed with dialysis. Looks good.  LDH stable 158.  - Continue bidil on non dialysis days, MAP mainly in 80s currently.   - Continue asa 81 mg daily.  - Coumadin on hold for vascular access. On heparin drip. INR 1.3. Restart coumadin after AVF placement 3. HTN: MAP 80s, continue Bidil on non-HD days.  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 - Coumadin on hold for AVF placement Monday. On heparin drip. INR 1.3.  5. Diabetic gastroparesis:Stable, eating normally with no abdominal pain. - Continue reglan 5 mg tid/ac and Marinol.  - Continue Protonix bid.  - Continue eythromycin indefinitely.  6. DM: - Continue home regimen. No change. 7. OIB:BCWUGQBV bilateral disease on ABIs in 8/18. Denies claudication currently, no pedal ulcers. No change. 8. Atrial flutter: Paroxysmal.Remains in NSR.  - On heparin drip while off coumadin. 9. CAD: No chest pain. - Continue statin and asa.  10. Difficult IV access: Would eventually like a port placed.  11. Anemia: In setting of bleeding from catheter site, now resolved.  No overt GI bleeding.  Hgb 7.5 today.  - Will hold  off on transfusion today as hgb > 7.  However, if Hgb is not higher tomorrow, will give him 1 unit PRBCs with tomorrow's dialysis.  I reviewed the LVAD parameters from today, and compared the results to the patient's prior recorded data.  No programming changes were made.  The LVAD is functioning within specified parameters.  The patient performs LVAD self-test daily.  LVAD interrogation was negative for any significant power changes, alarms or PI events/speed drops.  LVAD equipment check completed and is in good working order.  Back-up equipment present.   LVAD education done on emergency procedures and precautions and reviewed exit site care.  Length of Stay: 4  Loralie Champagne, MD 05/27/2018, 9:28 AM  VAD Team --- VAD ISSUES ONLY--- Pager [phone] (7am - 7am)  Advanced Heart Failure Team  Pager [phone] (M-F; 7a - 4p)  Please contact [facility] for night-coverage after hours (4p -7a ) and weekends on [email or web address]

## 2018-05-28 ENCOUNTER — Inpatient Hospital Stay (HOSPITAL_COMMUNITY): Payer: BLUE CROSS/BLUE SHIELD

## 2018-05-28 ENCOUNTER — Encounter (HOSPITAL_COMMUNITY): Payer: Self-pay | Admitting: Anesthesiology

## 2018-05-28 ENCOUNTER — Ambulatory Visit (HOSPITAL_COMMUNITY): Payer: Self-pay

## 2018-05-28 DIAGNOSIS — E1143 Type 2 diabetes mellitus with diabetic autonomic (poly)neuropathy: Secondary | ICD-10-CM

## 2018-05-28 DIAGNOSIS — K3184 Gastroparesis: Secondary | ICD-10-CM

## 2018-05-28 LAB — BASIC METABOLIC PANEL
Anion gap: 12 (ref 5–15)
BUN: 45 mg/dL — ABNORMAL HIGH (ref 6–20)
CALCIUM: 8.2 mg/dL — AB (ref 8.9–10.3)
CO2: 24 mmol/L (ref 22–32)
CREATININE: 8.89 mg/dL — AB (ref 0.61–1.24)
Chloride: 100 mmol/L (ref 98–111)
GFR calc non Af Amer: 6 mL/min — ABNORMAL LOW (ref >60)
GFR, EST AFRICAN AMERICAN: 7 mL/min — AB (ref >60)
Glucose, Bld: 69 mg/dL — ABNORMAL LOW (ref 70–99)
Potassium: 3.9 mmol/L (ref 3.5–5.1)
SODIUM: 136 mmol/L (ref 135–145)

## 2018-05-28 LAB — CBC
HCT: 22.5 % — ABNORMAL LOW (ref 39.0–52.0)
Hemoglobin: 7 g/dL — ABNORMAL LOW (ref 13.0–17.0)
MCH: 28 pg (ref 26.0–34.0)
MCHC: 31.1 g/dL (ref 30.0–36.0)
MCV: 90 fL (ref 78.0–100.0)
Platelets: 170 10*3/uL (ref 150–400)
RBC: 2.5 MIL/uL — ABNORMAL LOW (ref 4.22–5.81)
RDW: 14.9 % (ref 11.5–15.5)
WBC: 10.4 10*3/uL (ref 4.0–10.5)

## 2018-05-28 LAB — HEPARIN LEVEL (UNFRACTIONATED): HEPARIN UNFRACTIONATED: 0.28 [IU]/mL — AB (ref 0.30–0.70)

## 2018-05-28 LAB — GLUCOSE, CAPILLARY
GLUCOSE-CAPILLARY: 78 mg/dL (ref 70–99)
GLUCOSE-CAPILLARY: 100 mg/dL — AB (ref 70–99)
GLUCOSE-CAPILLARY: 68 mg/dL — AB (ref 70–99)
GLUCOSE-CAPILLARY: 188 mg/dL — AB (ref 70–99)
GLUCOSE-CAPILLARY: 140 mg/dL — AB (ref 70–99)

## 2018-05-28 LAB — PROTIME-INR
INR: 1.25
Prothrombin Time: 15.6 seconds — ABNORMAL HIGH (ref 11.4–15.2)

## 2018-05-28 LAB — LACTATE DEHYDROGENASE: LDH: 155 U/L (ref 98–192)

## 2018-05-28 LAB — PREPARE RBC (CROSSMATCH)

## 2018-05-28 MED ORDER — SODIUM CHLORIDE 0.9% IV SOLUTION: Freq: Once | INTRAVENOUS | Status: DC

## 2018-05-28 MED ORDER — LORAZEPAM 2 MG/ML IJ SOLN
1.0000 mg | INTRAMUSCULAR | Status: DC | PRN
  Administered 2018-06-04 – 2018-06-05 (×2): 1 mg via INTRAVENOUS
  Filled 2018-05-28 (×2): qty 1

## 2018-05-28 MED ORDER — ETOMIDATE 2 MG/ML IV SOLN
INTRAVENOUS | Status: AC
  Filled 2018-05-28: qty 10

## 2018-05-28 MED ORDER — MIDAZOLAM HCL 2 MG/2ML IJ SOLN
INTRAMUSCULAR | Status: AC
  Filled 2018-05-28: qty 2

## 2018-05-28 MED ORDER — PROPOFOL 10 MG/ML IV BOLUS
INTRAVENOUS | Status: AC
  Filled 2018-05-28: qty 20

## 2018-05-28 MED ORDER — FENTANYL CITRATE (PF) 250 MCG/5ML IJ SOLN
INTRAMUSCULAR | Status: AC
  Filled 2018-05-28: qty 5

## 2018-05-28 MED ORDER — CALCITRIOL 0.25 MCG PO CAPS
0.5000 ug | ORAL_CAPSULE | ORAL | Status: DC
  Administered 2018-05-28 – 2018-06-04 (×3): 0.5 ug via ORAL
  Filled 2018-05-28 (×3): qty 2

## 2018-05-28 MED ORDER — LIDOCAINE 2% (20 MG/ML) 5 ML SYRINGE
INTRAMUSCULAR | Status: AC
  Filled 2018-05-28: qty 5

## 2018-05-28 MED ORDER — PROMETHAZINE HCL 25 MG/ML IJ SOLN
12.5000 mg | Freq: Four times a day (QID) | INTRAMUSCULAR | Status: DC | PRN
  Administered 2018-05-28 – 2018-06-05 (×3): 12.5 mg via INTRAVENOUS
  Filled 2018-05-28 (×3): qty 1

## 2018-05-28 MED ORDER — VANCOMYCIN HCL IN DEXTROSE 750-5 MG/150ML-% IV SOLN
750.0000 mg | INTRAVENOUS | Status: AC
  Administered 2018-05-28: 750 mg via INTRAVENOUS
  Filled 2018-05-28: qty 150

## 2018-05-28 MED ORDER — HEPARIN SODIUM (PORCINE) 1000 UNIT/ML IJ SOLN
INTRAMUSCULAR | Status: AC
  Filled 2018-05-28: qty 1

## 2018-05-28 NOTE — Progress Notes (Signed)
Vascular Surgery Progress Note  S: On vanc/zosyn now.  WBC normal.  No fever overnight.  Segment Diameter Depth Comment  1. Axilla 0.16cm 1.27cm   2. Mid upper arm 0.16cm 0.38cm   3. Above [facility] 0.13cm 0.59cm Branch  4. In [facility] 0.23cm 0.22cm   5. Below AC 0.18cm 0.50cm   6. Mid forearm 0.16cm 0.44cm   7. Wrist 0.18cm 0.29cm                   Basilic  Segment Diameter Depth Comment  1. Axilla 0.4cm 1.94cm   2. Mid upper arm 0.24cm 1.65cm   3. Above [facility] 0.33cm 1.32cm   4. In [facility] 0.33cm 0.91cm   5. Below AC 0.28cm 0.28cm   6. Mid forearm 0.20cm 0.23cm   7. Wrist 0.17cm 0.20cm                    A/P:  Plan for LUE AVF vs graft placement today.  Vein mapping shown above.  Marty Heck, MD [facility] Office: [phone] Pager: [place]

## 2018-05-28 NOTE — Progress Notes (Addendum)
[facility] NEPHROLOGY PROGRESS NOTE  Assessment/ Plan: Pt is a 52 y.o. yo male admitted with clotted right arm AV graft, underwent OR on 9/19,clotted AVG unable to salvage due to severely atretic draining veins. MWF [facility] 4h 10mn 78.5kg (raising edw to ~80 kg here) 3k/2Ca bath RUA AVG Hep none - calc 0.5ug tiw - no ESA  #Clotted right arm AVG: Plan for left upper extremity AVG/aVF today by vascular.  He has L IJ TDC for dialysis.  # ESRD: HD MWF.  Plan for dialysis today.  EDW to 80 kg.  Need to coordinate with the OR.  Discussed with RN.  # Anemia: Hemoglobin dropped to 7 likely due to blood loss from the catheter site.  Plan to transfuse 2 unit of RBC during dialysis today.  No further bleeding noticed.  Continue Aranesp.  # Secondary hyperparathyroidism: Calcium acceptable.  Resume calcitriol.  # HTN/volume: Blood pressure acceptable.  4K bath.  Subjective: Seen and examined at bedside.  Denies chest pain, shortness of breath, nausea vomiting.  Plan for OR today. Objective Vital signs in last 24 hours: Vitals:   05/27/18 2000 05/28/18 0000 05/28/18 0600 05/28/18 0722  BP: 102/68 (!) 85/58  (!) 152/130  Pulse:    89  Resp: 19 (!) 22 (!) 24 (!) 28  Temp: 100.2 F (37.9 C) 98.6 F (37 C)  98.7 F (37.1 C)  TempSrc: Oral Oral  Oral  SpO2:    100%  Weight:   82.4 kg   Height:       Weight change: 1.2 kg  Intake/Output Summary (Last 24 hours) at 05/28/2018 0823 Last data filed at 05/28/2018 04431Gross per 24 hour  Intake 1027.71 ml  Output 0 ml  Net 1027.71 ml       Labs: Basic Metabolic Panel: Recent Labs  Lab 05/26/18 0451 05/27/18 0400 05/28/18 0500  NA 139 137 136  K 4.0 3.7 3.9  CL 101 100 100  CO2 28 25 24   GLUCOSE 107* 112* 69*  BUN 22* 35* 45*  CREATININE 4.60* 7.18* 8.89*  CALCIUM 8.5* 8.2* 8.2*   Liver Function Tests: Recent Labs  Lab 05/23/18 1625  AST 29  ALT 40  ALKPHOS 119  BILITOT 0.9  PROT 7.1  ALBUMIN 3.3*    No results for input(s): LIPASE, AMYLASE in the last 168 hours. No results for input(s): AMMONIA in the last 168 hours. CBC: Recent Labs  Lab 05/25/18 0312 05/26/18 0451 05/26/18 1415 05/27/18 0400 05/28/18 0500  WBC 6.5 8.4 8.1 9.4 10.4  HGB 9.7* 9.3* 8.4* 7.5* 7.0*  HCT 30.8* 30.7* 27.5* 24.4* 22.5*  MCV 89.5 91.6 92.0 90.7 90.0  PLT 173 182 167 163 170   Cardiac Enzymes: No results for input(s): CKTOTAL, CKMB, CKMBINDEX, TROPONINI in the last 168 hours. CBG: Recent Labs  Lab 05/26/18 2226 05/27/18 0811 05/27/18 1226 05/27/18 1717 05/28/18 0806  GLUCAP 187* 109* 178* 130* 78    Iron Studies: No results for input(s): IRON, TIBC, TRANSFERRIN, FERRITIN in the last 72 hours. Studies/Results: Dg Chest Port 1 View  Result Date: 05/27/2018 CLINICAL DATA:  Fever and chills EXAM: PORTABLE CHEST 1 VIEW COMPARISON:  05/24/2018 chest radiograph. FINDINGS: Stable configuration of left ventricular assist device, intact sternotomy wires, 2 lead left subclavian ICD and right internal jugular central venous catheter with tip in the lower third of the SVC. Stable cardiomediastinal silhouette with mild cardiomegaly. No pneumothorax. Stable trace bilateral pleural effusions. No pulmonary edema. Minimal right basilar scarring versus  atelectasis. No consolidative airspace disease. IMPRESSION: 1. No acute consolidative airspace disease. 2. Stable cardiomegaly without pulmonary edema. 3. Stable trace bilateral pleural effusions. 4. Minimal right basilar scarring versus atelectasis. Electronically Signed   By: Ilona Sorrel M.D.   On: 05/27/2018 22:05    Medications: Infusions: .  ceFAZolin (ANCEF) IV    . heparin 1,200 Units/hr (05/28/18 0600)  . piperacillin-tazobactam (ZOSYN)  IV      Scheduled Medications: . sodium chloride   Intravenous Once  . aspirin EC  81 mg Oral Daily  . atorvastatin  40 mg Oral q1800  . Chlorhexidine Gluconate Cloth  6 each Topical Q0600  . Chlorhexidine  Gluconate Cloth  6 each Topical Q0600  . citalopram  20 mg Oral Daily  . darbepoetin (ARANESP) injection - DIALYSIS  40 mcg Intravenous Q Fri-HD  . dronabinol  2.5 mg Oral BID AC  . erythromycin  250 mg Oral TID  . feeding supplement (NEPRO CARB STEADY)  237 mL Oral BID BM  . Influenza vac split quadrivalent PF  0.5 mL Intramuscular Tomorrow-1000  . insulin aspart  5 Units Subcutaneous TID WC  . insulin glargine  10 Units Subcutaneous Daily  . isosorbide-hydrALAZINE  1.5 tablet Oral 3 times per day on Sun Tue Thu Sat  . metoCLOPramide  10 mg Oral TID  . pantoprazole  40 mg Oral BID  . vancomycin variable dose per unstable renal function (pharmacist dosing)   Does not apply See admin instructions    have reviewed scheduled and prn medications.  Physical Exam: General:NAD, comfortable Heart:mechanical sound,  Lungs:clear b/l, no crackle Abdomen:soft, Non-tender, non-distended Extremities:No edema Dialysis Access: Left IJ tunnel catheter site with no bleeding.  Right upper extremity AV graft no thrill or bruit.  Evone Arseneau Reesa Chew Helmut Hennon 05/28/2018,8:23 AM  LOS: 5 days

## 2018-05-28 NOTE — Progress Notes (Signed)
ANTICOAGULATION CONSULT NOTE  Pharmacy Consult:  Heparin > warfarin Indication:  LVAD  Allergies  Allergen Reactions  . Metformin And Related Diarrhea    Patient Measurements: Height: 5' 8"  (172.7 cm) Weight: 181 lb 10.5 oz (82.4 kg) IBW/kg (Calculated) : 68.4 Heparin Dosing Weight: 80 kg  Vital Signs: Temp: 98.1 F (36.7 C) (09/23 1138) Temp Source: Oral (09/23 1138) BP: 134/82 (09/23 1200) Pulse Rate: 55 (09/23 1200)  Labs: Recent Labs    05/26/18 0451 05/26/18 1415 05/27/18 0400 05/28/18 0500  HGB 9.3* 8.4* 7.5* 7.0*  HCT 30.7* 27.5* 24.4* 22.5*  PLT 182 167 163 170  LABPROT 17.7*  --  16.1* 15.6*  INR 1.47  --  1.31 1.25  HEPARINUNFRC  --  0.21* 0.20* 0.28*  CREATININE 4.60*  --  7.18* 8.89*    Estimated Creatinine Clearance: 10.1 mL/min (A) (by C-G formula based on SCr of 8.89 mg/dL (H)).  Assessment: 74 yoM with PMH HF s/p HM3 and Factor 5 Leiden on warfarin PTA. Warfarin on hold for HD access and pt on IV heparin bridge. Heparin level 0.28 this morning, INR subtherapeutic as expected at 1.25. Hgb continues to trend down today to 7.0 and pt to receive pRBCs. VVS planning for new graft to LUE tomorrow.   Goal of Therapy:  Heparin level 0.3-0.7 units/ml  INR 2-3 Monitor platelets by anticoagulation protocol: Yes   Plan:  -Increase heparin slightly to 1250 units/hr given hx factor 5 Leiden -Recheck heparin level with morning labs  Arrie Senate, PharmD, BCPS Clinical Pharmacist [phone] Please check AMION for all [place] numbers 05/28/2018

## 2018-05-28 NOTE — Progress Notes (Signed)
LVAD Coordinator Rounding Note:  Direct admitted 05/23/18 from dialysis center. Dialysis center unable to access RUA graft for dialysis treatment.  HM III LVAD implanted on 06/12/17 by Dr. Prescott Gum under Destination Therapy criteria due to renal insufficiency. Pump exchange on 03/01/18 for outflow graft twist/obstruction.   Pt lying in the bedl, very nauseous with dry heaves. Nurse gave PO Zofran and Ativan. Nausea continued, IV Phenergan given.   Pt has had low grad temp over weekend; blood cultures pending.  Hgb 7.0 today with no current T&C. Heparin on at 1200 units/hr. Plan for 2 units PC's today during dialysis.   Vital signs: Temp: 98.7 HR:  80 Doppler Pressure:  120 Auto BP:  152/130 (139) O2 Sat: 100% on RA Wt: 79.9>79.5kg  LVAD interrogation reveals:  Speed: 5500 Flow: 3.8 Power: 4.3w PI: 4.6 Alarms: none  Events:  None since 05/26/18; >100 PI events on 05/25/18 Hematocrit: 23 Fixed speed: 5500 Low speed limit: 5200  Drive Line:  Pt is currently doing every 3 days dressing changes using the daily kit with silver strip. Dressing is CDI w/anchor secure. Continue twice a week dressing changes. Next dressing change will be due 05/31/18. Bedside nurse may change dressing since pt is no  Labs: LDH trend: 191>195>184>191  INR trend: 2.24>1.84>1.47>1.31  Blood Products: - plan for 2 units today during hemolysis   Anticoagulation Plan: -INR Goal: 2.0 - 3.0-currently on Heparin drip -ASA Dose: 81 mg daily   Device: -St Jude dual  -Therapies: on 200 bpm - Monitor: on VT 150 bpm  Adverse Events on VAD: - ESRD post LVAD; started CVVH 06/15/17; HD started 06/20/17 - Hospitalization 11/5 - 07/16/17 > gastroparesis diagnosis after EGD - Hospitalization 11/26 - 08/04/17 > gastroparesis - Hospitalization 1/8 - 09/16/17>gastroparesis - referred to Dr. Corliss Parish at [facility] - 10/31/17>rightupper arm arteriovenous graft with Artegraft; ligation of right first stage brachial vein  transposition  - 12/11/13 elective admission for thrombectomy for AV graft in RUA  - 12/20/17 > intractable N/V - 01/19/18 > intractable N/V sent by EMS from dialysis center - 03/01/18 > outflow graft twist/occulsion requiring pump exchange - 05/23/18 > admitted for non-working fistula in RUA   Plan/Recommendations:  1. Please page VAD coordinator with any equipment questions or concerns. 2. Bedside nurse may change VAD dressing using daily kit with silver strip. Next change due 05/31/18. 2. Surgery cancelled today due to severe nausea. Will plan on Wed afternoon case; VAD coordinator to accompany patient to OR.    Zada Girt RN, VAD Coordinator 24/7 VAD Pager: [phone]

## 2018-05-28 NOTE — Progress Notes (Signed)
52 year old male with end-stage renal disease and LVAD that was scheduled for a left upper extremity AV fistula today.  Unfortunately he has had nausea and dry heaving this morning likely related to his underlying chronic gastroparesis.  Anesthesia is hesitant about him going to the OR at this time given the plan for MAC.  I talked to Dr. Aundra Dubin who is going to work on treating his symptoms today as well as getting him dialyzed.  We will put him on the OR schedule tomorrow with my partner Dr. Servando Snare for left upper extremity AV fistula placement.  Please have him n.p.o. after midnight.  Would need to hold heparin on-call to the OR.  Marty Heck, MD [facility] Office: [phone] Pager: [place]

## 2018-05-28 NOTE — Progress Notes (Addendum)
Patient ID: [name], male   DOB: [birth year], 52 y.o.   MRN: [id number]   Advanced Heart Failure VAD Team Note  PCP-Cardiologist: No primary care provider on file.   Subjective:     RUE AVG thrombosed, unable to restore flow. Planning to place new graft in LUE. Now plan for Tuesday with nausea/vomiting today.   Underwent perm cath placement 9/20.   Hgb down to 7. T Max 100.3 yesterday, was cultured and started empirically on vancomycin/Zosyn given multiple new lines.   Complaining of nausea.    LVAD INTERROGATION:  HeartMate 3 LVAD:   Flow 4.2 liters/min, speed 5500, power 4, PI 3.4   < 10 PI events.      Objective:    Vital Signs:   Temp:  [97.7 F (36.5 C)-100.2 F (37.9 C)] 97.7 F (36.5 C) (09/23 0905) Pulse Rate:  [67-97] 67 (09/23 0930) Resp:  [16-28] 16 (09/23 0920) BP: (85-152)/(58-130) 112/84 (09/23 0930) SpO2:  [93 %-100 %] 96 % (09/23 0905) Weight:  [82.4 kg] 82.4 kg (09/23 0905) Last BM Date: 05/25/18 Mean arterial Pressure 80s-90s  Intake/Output:   Intake/Output Summary (Last 24 hours) at 05/28/2018 0936 Last data filed at 05/28/2018 3832 Gross per 24 hour  Intake 1027.71 ml  Output 0 ml  Net 1027.71 ml     Physical Exam   Physical Exam: GENERAL: In bed. Appears fatigued. No acute distress. HEENT: normal  NECK: Supple, JVP 7-8 .  2+ bilaterally, no bruits.  No lymphadenopathy or thyromegaly appreciated.   CARDIAC:  Mechanical heart sounds with LVAD hum present.  LUNGS:  Clear to auscultation bilaterally.  ABDOMEN:  Soft, round, nontender, positive bowel sounds x4.     LVAD exit site: well-healed and incorporated.  Dressing dry and intact.  No erythema or drainage.  Stabilization device present and accurately applied.  Driveline dressing is being changed daily per sterile technique. EXTREMITIES:  Warm and dry, no cyanosis, clubbing, rash or edema  NEUROLOGIC:  Drowsy  No aphasia.  No dysarthria.  Affect flat .       Telemetry   NSR  80-90s. Personally reviewed.   EKG    No new tracings.   Labs   Basic Metabolic Panel: Recent Labs  Lab 05/24/18 0330 05/25/18 0312 05/26/18 0451 05/27/18 0400 05/28/18 0500  NA 140 137 139 137 136  K 6.0* 4.5 4.0 3.7 3.9  CL 106 99 101 100 100  CO2 22 26 28 25 24   GLUCOSE 104* 124* 107* 112* 69*  BUN 74* 24* 22* 35* 45*  CREATININE 9.39* 4.95* 4.60* 7.18* 8.89*  CALCIUM 8.8* 8.1* 8.5* 8.2* 8.2*    Liver Function Tests: Recent Labs  Lab 05/23/18 1625  AST 29  ALT 40  ALKPHOS 119  BILITOT 0.9  PROT 7.1  ALBUMIN 3.3*   No results for input(s): LIPASE, AMYLASE in the last 168 hours. No results for input(s): AMMONIA in the last 168 hours.  CBC: Recent Labs  Lab 05/25/18 0312 05/26/18 0451 05/26/18 1415 05/27/18 0400 05/28/18 0500  WBC 6.5 8.4 8.1 9.4 10.4  HGB 9.7* 9.3* 8.4* 7.5* 7.0*  HCT 30.8* 30.7* 27.5* 24.4* 22.5*  MCV 89.5 91.6 92.0 90.7 90.0  PLT 173 182 167 163 170    INR: Recent Labs  Lab 05/24/18 0330 05/25/18 0312 05/26/18 0451 05/27/18 0400 05/28/18 0500  INR 2.24 1.84 1.47 1.31 1.25    Other results:     Imaging   Dg Chest [facility] 1 View  Result  Date: 05/27/2018 CLINICAL DATA:  Fever and chills EXAM: PORTABLE CHEST 1 VIEW COMPARISON:  05/24/2018 chest radiograph. FINDINGS: Stable configuration of left ventricular assist device, intact sternotomy wires, 2 lead left subclavian ICD and right internal jugular central venous catheter with tip in the lower third of the SVC. Stable cardiomediastinal silhouette with mild cardiomegaly. No pneumothorax. Stable trace bilateral pleural effusions. No pulmonary edema. Minimal right basilar scarring versus atelectasis. No consolidative airspace disease. IMPRESSION: 1. No acute consolidative airspace disease. 2. Stable cardiomegaly without pulmonary edema. 3. Stable trace bilateral pleural effusions. 4. Minimal right basilar scarring versus atelectasis. Electronically Signed   By: Ilona Sorrel M.D.    On: 05/27/2018 22:05     Medications:     Scheduled Medications: . sodium chloride   Intravenous Once  . aspirin EC  81 mg Oral Daily  . atorvastatin  40 mg Oral q1800  . calcitRIOL  0.5 mcg Oral Q M,W,F  . Chlorhexidine Gluconate Cloth  6 each Topical Q0600  . Chlorhexidine Gluconate Cloth  6 each Topical Q0600  . citalopram  20 mg Oral Daily  . darbepoetin (ARANESP) injection - DIALYSIS  40 mcg Intravenous Q Fri-HD  . dronabinol  2.5 mg Oral BID AC  . erythromycin  250 mg Oral TID  . feeding supplement (NEPRO CARB STEADY)  237 mL Oral BID BM  . Influenza vac split quadrivalent PF  0.5 mL Intramuscular Tomorrow-1000  . insulin aspart  5 Units Subcutaneous TID WC  . insulin glargine  10 Units Subcutaneous Daily  . isosorbide-hydrALAZINE  1.5 tablet Oral 3 times per day on Sun Tue Thu Sat  . metoCLOPramide  10 mg Oral TID  . pantoprazole  40 mg Oral BID  . vancomycin variable dose per unstable renal function (pharmacist dosing)   Does not apply See admin instructions    Infusions: .  ceFAZolin (ANCEF) IV    . heparin 1,200 Units/hr (05/28/18 0600)  . piperacillin-tazobactam (ZOSYN)  IV    . vancomycin      PRN Medications: acetaminophen, docusate sodium, LORazepam, ondansetron (ZOFRAN) IV, promethazine, promethazine   Patient Profile   [name] McLeanis a 52 y.o.malewith history of CAD s/p anterior MI in 2010 and NSTEMI in 3/18 with DES to pRCA and mLAD and ischemic cardiomyopathy now s/p Heartmate 3 LVAD and ESRD He was admitted in 10/18 and Heartmate 3 LVAD was placed. He had baseline CKD stage 3 likely from diabetic nephropathy and CHF. He developed peri-op AKI and ended up on dialysis. No renal recovery to date.  Admitted with malfunctioning AVF. Nephrology recommended admit.   Assessment/Plan:    1. ESRD with clotted AVF: Unable to unclot AVF in cath lab.  Planned for placement of LUE AV graft or fistula tomorrow morning (delayed from today with  nausea/vomiting). No further oozing at tunneled catheter site.  - Currently dialyzing through tunneled HD cath. Tolerating well. Nephrology following 2.Chronic Systolic Heart Failure with HMIII VAD, St Jude ICD. Volume managed per dialysis. LDH 155.   - Continue bidil on non dialysis days,  MAP high prior to dialysis and with nausea/vomiting today.    - Continue asa 81 mg daily.  - Coumadin on hold for vascular access. On heparin drip. INR 1.25.   - Restart coumadin after AVF placement 3. HTN: MAP 80s, continue Bidil on non-HD days.  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 - Coumadin on hold for AVF placement on Tuesday.  On heparin drip 1.25. .  5. Diabetic  gastroparesis: - nauseated today, suspect gastroparesis. - Continue reglan 5 mg tid/ac and Marinol.  - Continue Protonix bid.  - Continue eythromycin indefinitely.  - Will give Ativan today as this generally seems to help.  6. DM: - Continue home regimen. No change. 7. HCW:CBJSEGBT bilateral disease on ABIs in 8/18. Denies claudication currently, no pedal ulcers. No change. 8. Atrial flutter: Paroxysmal.Remains in NSR.  - On heparin drip while off coumadin. 9. CAD: No chest pain. - Continue statin and asa.  10. Difficult IV access: Would eventually like a port placed.  11. Anemia: In setting of bleeding from catheter site, now resolved.  No overt GI bleeding.   - Hgb 7. Will give 2UPRBCs today.  12. Fever: To 100.3 yesterday pm.  He was cultured and with multiple new lines, empiric vancomycin/Zosyn started. WBCs mildly elevated from prior to 10.4.   I reviewed the LVAD parameters from today, and compared the results to the patient's prior recorded data.  No programming changes were made.  The LVAD is functioning within specified parameters.  The patient performs LVAD self-test daily.  LVAD interrogation was negative for any significant power changes, alarms or PI events/speed drops.  LVAD equipment check completed  and is in good working order.  Back-up equipment present.   LVAD education done on emergency procedures and precautions and reviewed exit site care.  Length of Stay: 5  Darrick Grinder, NP 05/28/2018, 9:36 AM  VAD Team --- VAD ISSUES ONLY--- Pager [phone] (7am - 7am)  Advanced Heart Failure Team  Pager [phone] (M-F; 7a - 4p)  Please contact [facility] for night-coverage after hours (4p -7a ) and weekends on [email or web address]  Patient seen with NP, agree with the above note.   Several issues today: Fever to 100.3 yesterday afternoon, now cultured and empirically on vancomycin/Zosyn.  Nausea/vomiting today so vascular procedure now on hold until tomorrow.  Hgb down to 7, plan for transfusion.   Currently getting HD.  He is sleeping after getting Phenergan.    On exam, no JVD, normal LVAD sounds.  No edema.  Clear lungs.  LVAD parameters reviewed and stable.   He will need 2 units PRBCs with HD today.  Suspect hgb down with profuse bleeding from [facility] site, now resolved.  No overt GI bleeding.   INR 1.25 today, on IV heparin gtt.  As bleeding has stopped, will continue heparin.  Plan for AV graft placement tomorrow if nausea/vomiting improved.   Suspect N/V is due to diabetic gastroparesis (chronic pattern).  - Continue Reglan, phenergan.  - He is on chronic erythromycin.  - Added Ativan.   - Seems to be doing better now.   Tmax 100.3 yesterday evening.  Given multiple new lines, he was cultured and empiric abx begun.  WBCs up some to 10. Follow closely. CXR with no PNA.   [name] 05/28/2018 10:36 AM

## 2018-05-28 NOTE — Progress Notes (Signed)
Pharmacy Antibiotic Note  [name] is a 52 y.o. male admitted on 05/23/2018 with sepsis.  Pharmacy has been consulted for vanc/zosyn dosing.  LVAD pt who is here for several different issues. Currently on empiric vancomycin and zosyn to cover fevers. Blood cx have been sent. He is an ESRD pt with HD schedule of MWF. Plan for placement of AVF but this has been delayed until 9/24. Pt with only one line and vancomycin is incompatible with IV heparin. Confirmed that vancomycin can infuse via HD port over last 1 hour of HD.  Pt in HD currently so will order for Vancomycin 790m IV now.  Plan: Vancomycin 751mIV with HD - next dose today (Currently scheduled M/W/F) Zosyn 3.375g IV q12h extended infusion   Height: 5' 8"  (172.7 cm) Weight: 181 lb 10.5 oz (82.4 kg) IBW/kg (Calculated) : 68.4  Temp (24hrs), Avg:98.7 F (37.1 C), Min:97.7 F (36.5 C), Max:100.2 F (37.9 C)  Recent Labs  Lab 05/24/18 0330 05/25/18 0312 05/26/18 0451 05/26/18 1415 05/27/18 0400 05/28/18 0500  WBC 8.4 6.5 8.4 8.1 9.4 10.4  CREATININE 9.39* 4.95* 4.60*  --  7.18* 8.89*    Estimated Creatinine Clearance: 10.1 mL/min (A) (by C-G formula based on SCr of 8.89 mg/dL (H)).    Allergies  Allergen Reactions  . Metformin And Related Diarrhea    Antimicrobials this admission: 9/22 daptomycin x1 9/22 zosyn >>  9/22 Vancomycin>>  Dose adjustments this admission:   Microbiology results: 9/22 BCx:  9/18 MRSA PCR: neg  CaSherlon HandingPharmD, BCPS Clinical pharmacist  **Pharmacist phone directory can now be found on [email or web address] ([email or web address]).  Listed under MCObion9/23/2019 9:54 AM

## 2018-05-29 ENCOUNTER — Inpatient Hospital Stay (HOSPITAL_COMMUNITY): Payer: BLUE CROSS/BLUE SHIELD | Admitting: Anesthesiology

## 2018-05-29 ENCOUNTER — Encounter (HOSPITAL_COMMUNITY)
Admission: AD | Disposition: A | Discharge status: Home / Self Care | Payer: Self-pay | Source: Ambulatory Visit | Attending: Cardiology

## 2018-05-29 HISTORY — PX: AV FISTULA PLACEMENT: SHX1204

## 2018-05-29 LAB — RENAL FUNCTION PANEL
Albumin: 2.7 g/dL — ABNORMAL LOW (ref 3.5–5.0)
Anion gap: 10 (ref 5–15)
BUN: 20 mg/dL (ref 6–20)
CHLORIDE: 98 mmol/L (ref 98–111)
CO2: 26 mmol/L (ref 22–32)
CREATININE: 4.9 mg/dL — AB (ref 0.61–1.24)
Calcium: 8.2 mg/dL — ABNORMAL LOW (ref 8.9–10.3)
GFR calc Af Amer: 14 mL/min — ABNORMAL LOW (ref >60)
GFR calc non Af Amer: 12 mL/min — ABNORMAL LOW (ref >60)
GLUCOSE: 149 mg/dL — AB (ref 70–99)
POTASSIUM: 4.3 mmol/L (ref 3.5–5.1)
Phosphorus: 3.3 mg/dL (ref 2.5–4.6)
Sodium: 134 mmol/L — ABNORMAL LOW (ref 135–145)

## 2018-05-29 LAB — BPAM RBC
Blood Product Expiration Date: 201910132359
Blood Product Expiration Date: 201910152359
ISSUE DATE / TIME: 201909231032
ISSUE DATE / TIME: 201909231032
Unit Type and Rh: 5100
Unit Type and Rh: 5100

## 2018-05-29 LAB — PROTIME-INR
INR: 1.18
Prothrombin Time: 14.9 seconds (ref 11.4–15.2)

## 2018-05-29 LAB — TYPE AND SCREEN
ABO/RH(D): O POS
ANTIBODY SCREEN: NEGATIVE
UNIT DIVISION: 0
Unit division: 0

## 2018-05-29 LAB — GLUCOSE, CAPILLARY
GLUCOSE-CAPILLARY: 114 mg/dL — AB (ref 70–99)
GLUCOSE-CAPILLARY: 112 mg/dL — AB (ref 70–99)
GLUCOSE-CAPILLARY: 224 mg/dL — AB (ref 70–99)
GLUCOSE-CAPILLARY: 114 mg/dL — AB (ref 70–99)
Glucose-Capillary: 104 mg/dL — ABNORMAL HIGH (ref 70–99)
Glucose-Capillary: 102 mg/dL — ABNORMAL HIGH (ref 70–99)

## 2018-05-29 LAB — HEPARIN LEVEL (UNFRACTIONATED): HEPARIN UNFRACTIONATED: 0.25 [IU]/mL — AB (ref 0.30–0.70)

## 2018-05-29 LAB — CBC
HCT: 32.6 % — ABNORMAL LOW (ref 39.0–52.0)
Hemoglobin: 10.2 g/dL — ABNORMAL LOW (ref 13.0–17.0)
MCH: 27.7 pg (ref 26.0–34.0)
MCHC: 31.3 g/dL (ref 30.0–36.0)
MCV: 88.6 fL (ref 78.0–100.0)
PLATELETS: 199 10*3/uL (ref 150–400)
RBC: 3.68 MIL/uL — AB (ref 4.22–5.81)
RDW: 17 % — AB (ref 11.5–15.5)
WBC: 9.8 10*3/uL (ref 4.0–10.5)

## 2018-05-29 LAB — LACTATE DEHYDROGENASE: LDH: 166 U/L (ref 98–192)

## 2018-05-29 SURGERY — ARTERIOVENOUS (AV) FISTULA CREATION
Anesthesia: Monitor Anesthesia Care | Site: Arm Upper | Laterality: Left

## 2018-05-29 MED ORDER — WARFARIN SODIUM 7.5 MG PO TABS
7.5000 mg | ORAL_TABLET | Freq: Once | ORAL | Status: AC
  Administered 2018-05-29: 7.5 mg via ORAL
  Filled 2018-05-29: qty 1

## 2018-05-29 MED ORDER — PROPOFOL 500 MG/50ML IV EMUL: INTRAVENOUS | Status: DC | PRN

## 2018-05-29 MED ORDER — PROPOFOL 500 MG/50ML IV EMUL
INTRAVENOUS | Status: DC | PRN
  Administered 2018-05-29: 10 ug/kg/min via INTRAVENOUS

## 2018-05-29 MED ORDER — 0.9 % SODIUM CHLORIDE (POUR BTL) OPTIME
TOPICAL | Status: DC | PRN
  Administered 2018-05-29: 1000 mL

## 2018-05-29 MED ORDER — DEXMEDETOMIDINE HCL IN NACL 200 MCG/50ML IV SOLN
INTRAVENOUS | Status: AC
  Filled 2018-05-29: qty 50

## 2018-05-29 MED ORDER — SODIUM CHLORIDE 0.9 % IV SOLN
INTRAVENOUS | Status: AC
  Filled 2018-05-29: qty 1.2

## 2018-05-29 MED ORDER — MIDAZOLAM HCL 5 MG/5ML IJ SOLN
INTRAMUSCULAR | Status: DC | PRN
  Administered 2018-05-29: 2 mg via INTRAVENOUS

## 2018-05-29 MED ORDER — SODIUM CHLORIDE 0.9 % IV SOLN
INTRAVENOUS | Status: DC | PRN
  Administered 2018-05-29: 12:00:00

## 2018-05-29 MED ORDER — CHLORHEXIDINE GLUCONATE CLOTH 2 % EX PADS
6.0000 | MEDICATED_PAD | Freq: Every day | CUTANEOUS | Status: DC
  Administered 2018-05-29: 6 via TOPICAL

## 2018-05-29 MED ORDER — WARFARIN - PHARMACIST DOSING INPATIENT: Freq: Every day | Status: DC

## 2018-05-29 MED ORDER — LIDOCAINE-EPINEPHRINE (PF) 1 %-1:200000 IJ SOLN
INTRAMUSCULAR | Status: DC | PRN
  Administered 2018-05-29: 20 mL

## 2018-05-29 MED ORDER — ONDANSETRON HCL 4 MG/2ML IJ SOLN
INTRAMUSCULAR | Status: DC | PRN
  Administered 2018-05-29: 4 mg via INTRAVENOUS

## 2018-05-29 MED ORDER — CEFAZOLIN SODIUM-DEXTROSE 1-4 GM/50ML-% IV SOLN
INTRAVENOUS | Status: AC
  Filled 2018-05-29: qty 50

## 2018-05-29 MED ORDER — LIDOCAINE-EPINEPHRINE (PF) 1 %-1:200000 IJ SOLN
INTRAMUSCULAR | Status: AC
  Filled 2018-05-29: qty 60

## 2018-05-29 MED ORDER — CHLORHEXIDINE GLUCONATE CLOTH 2 % EX PADS
6.0000 | MEDICATED_PAD | Freq: Every day | CUTANEOUS | Status: DC
  Administered 2018-05-30 – 2018-06-02 (×3): 6 via TOPICAL

## 2018-05-29 MED ORDER — MIDAZOLAM HCL 2 MG/2ML IJ SOLN
INTRAMUSCULAR | Status: AC
  Filled 2018-05-29: qty 2

## 2018-05-29 MED ORDER — FENTANYL CITRATE (PF) 250 MCG/5ML IJ SOLN
INTRAMUSCULAR | Status: AC
  Filled 2018-05-29: qty 5

## 2018-05-29 MED ORDER — SODIUM CHLORIDE 0.9 % IV SOLN
INTRAVENOUS | Status: DC | PRN
  Administered 2018-05-29: 15 ug/min via INTRAVENOUS

## 2018-05-29 MED ORDER — CEFAZOLIN SODIUM-DEXTROSE 1-4 GM/50ML-% IV SOLN
INTRAVENOUS | Status: DC | PRN
  Administered 2018-05-29: 1 g via INTRAVENOUS

## 2018-05-29 MED ORDER — SODIUM CHLORIDE 0.9 % IV SOLN
INTRAVENOUS | Status: DC | PRN
  Administered 2018-05-29: 12:00:00 via INTRAVENOUS

## 2018-05-29 MED ORDER — ONDANSETRON HCL 4 MG/2ML IJ SOLN
INTRAMUSCULAR | Status: AC
  Filled 2018-05-29: qty 2

## 2018-05-29 MED ORDER — DEXMEDETOMIDINE HCL IN NACL 200 MCG/50ML IV SOLN
INTRAVENOUS | Status: DC | PRN
  Administered 2018-05-29: 1 ug/kg/h via INTRAVENOUS

## 2018-05-29 MED ORDER — OXYCODONE HCL 5 MG PO TABS: 5.0000 mg | ORAL_TABLET | Freq: Four times a day (QID) | ORAL | Status: AC | PRN

## 2018-05-29 SURGICAL SUPPLY — 30 items
ADH SKN CLS APL DERMABOND .7 (GAUZE/BANDAGES/DRESSINGS) ×1
ARMBAND PINK RESTRICT EXTREMIT (MISCELLANEOUS) ×2 IMPLANT
CANISTER SUCT 3000ML PPV (MISCELLANEOUS) ×2 IMPLANT
CLIP VESOCCLUDE MED 6/CT (CLIP) ×2 IMPLANT
CLIP VESOCCLUDE SM WIDE 6/CT (CLIP) ×2 IMPLANT
COVER PROBE W GEL 5X96 (DRAPES) IMPLANT
DERMABOND ADVANCED (GAUZE/BANDAGES/DRESSINGS) ×1
DERMABOND ADVANCED .7 DNX12 (GAUZE/BANDAGES/DRESSINGS) ×1 IMPLANT
ELECT REM PT RETURN 9FT ADLT (ELECTROSURGICAL) ×2
ELECTRODE REM PT RTRN 9FT ADLT (ELECTROSURGICAL) ×1 IMPLANT
GLOVE BIO SURGEON STRL SZ7.5 (GLOVE) ×2 IMPLANT
GLOVE BIO SURGEON STRL SZ8 (GLOVE) ×1 IMPLANT
GLOVE BIOGEL PI IND STRL 6.5 (GLOVE) IMPLANT
GLOVE BIOGEL PI INDICATOR 6.5 (GLOVE) ×1
GOWN STRL REUS W/ TWL LRG LVL3 (GOWN DISPOSABLE) ×2 IMPLANT
GOWN STRL REUS W/ TWL XL LVL3 (GOWN DISPOSABLE) ×1 IMPLANT
GOWN STRL REUS W/TWL LRG LVL3 (GOWN DISPOSABLE) ×4
GOWN STRL REUS W/TWL XL LVL3 (GOWN DISPOSABLE) ×2
KIT BASIN OR (CUSTOM PROCEDURE TRAY) ×2 IMPLANT
KIT TURNOVER KIT B (KITS) ×2 IMPLANT
NS IRRIG 1000ML POUR BTL (IV SOLUTION) ×2 IMPLANT
PACK CV ACCESS (CUSTOM PROCEDURE TRAY) ×2 IMPLANT
PAD ARMBOARD 7.5X6 YLW CONV (MISCELLANEOUS) ×4 IMPLANT
SUT MNCRL AB 4-0 PS2 18 (SUTURE) ×2 IMPLANT
SUT PROLENE 6 0 BV (SUTURE) ×3 IMPLANT
SUT VIC AB 3-0 SH 27 (SUTURE) ×2
SUT VIC AB 3-0 SH 27X BRD (SUTURE) ×1 IMPLANT
TOWEL GREEN STERILE (TOWEL DISPOSABLE) ×2 IMPLANT
UNDERPAD 30X30 (UNDERPADS AND DIAPERS) ×2 IMPLANT
WATER STERILE IRR 1000ML POUR (IV SOLUTION) ×2 IMPLANT

## 2018-05-29 NOTE — Progress Notes (Addendum)
Patient ID: [name], male   DOB: 1965-10-06, 52 y.o.   MRN: [id number]   Advanced Heart Failure VAD Team Note  PCP-Cardiologist: No primary care provider on file.   Subjective:     RUE AVG thrombosed, unable to restore flow. Planning to place new graft in LUE today.   Underwent perm cath placement 9/20.   Yesterday he received 2UPRBcs. Hgb went up to 10.2. Started on antibiotics for fever.    Feeling better today.   LVAD INTERROGATION:  HeartMate 3 LVAD:   Flow 4.3 liters/min, speed 5500, power 4, PI 3.6.  No PI events.      Objective:    Vital Signs:   Temp:  [97.8 F (36.6 C)-99.5 F (37.5 C)] 98.7 F (37.1 C) (09/24 0737) Pulse Rate:  [55-87] 87 (09/24 0737) Resp:  [15-37] 22 (09/24 0737) BP: (90-139)/(72-122) 139/122 (09/24 0737) SpO2:  [97 %-99 %] 97 % (09/24 0737) Weight:  [80.4 kg-80.9 kg] 80.9 kg (09/24 0544) Last BM Date: 05/26/18 Mean arterial Pressure 80-90s   Intake/Output:   Intake/Output Summary (Last 24 hours) at 05/29/2018 0907 Last data filed at 05/29/2018 0346 Gross per 24 hour  Intake 1768.99 ml  Output 3259 ml  Net -1490.01 ml     Physical Exam    Physical Exam: GENERAL: in bed. NAD HEENT: normal  NECK: Supple, JVP 7-8  .  Carotids 2+ bilaterally, no bruits.  No lymphadenopathy or thyromegaly appreciated.   CARDIAC:  Mechanical heart sounds with LVAD hum present.  LUNGS:  Clear to auscultation bilaterally.  ABDOMEN:  Soft, round, nontender, positive bowel sounds x4.     LVAD exit site: well-healed and incorporated.  Dressing dry and intact.  No erythema or drainage.  Stabilization device present and accurately applied.   EXTREMITIES:  Warm and dry, no cyanosis, clubbing, rash or edema  NEUROLOGIC:  Alert and oriented x 4.     Telemetry   NSR 80-90s personally reviewed.   EKG    No new tracings.   Labs   Basic Metabolic Panel: Recent Labs  Lab 05/25/18 0312 05/26/18 0451 05/27/18 0400 05/28/18 0500 05/29/18 0410   NA 137 139 137 136 134*  K 4.5 4.0 3.7 3.9 4.3  CL 99 101 100 100 98  CO2 26 28 25 24 26   GLUCOSE 124* 107* 112* 69* 149*  BUN 24* 22* 35* 45* 20  CREATININE 4.95* 4.60* 7.18* 8.89* 4.90*  CALCIUM 8.1* 8.5* 8.2* 8.2* 8.2*  PHOS  --   --   --   --  3.3    Liver Function Tests: Recent Labs  Lab 05/23/18 1625 05/29/18 0410  AST 29  --   ALT 40  --   ALKPHOS 119  --   BILITOT 0.9  --   PROT 7.1  --   ALBUMIN 3.3* 2.7*   No results for input(s): LIPASE, AMYLASE in the last 168 hours. No results for input(s): AMMONIA in the last 168 hours.  CBC: Recent Labs  Lab 05/26/18 0451 05/26/18 1415 05/27/18 0400 05/28/18 0500 05/29/18 0410  WBC 8.4 8.1 9.4 10.4 9.8  HGB 9.3* 8.4* 7.5* 7.0* 10.2*  HCT 30.7* 27.5* 24.4* 22.5* 32.6*  MCV 91.6 92.0 90.7 90.0 88.6  PLT 182 167 163 170 199    INR: Recent Labs  Lab 05/25/18 0312 05/26/18 0451 05/27/18 0400 05/28/18 0500 05/29/18 0410  INR 1.84 1.47 1.31 1.25 1.18    Other results:     Imaging   Dg Chest  Port 1 View  Result Date: 05/27/2018 CLINICAL DATA:  Fever and chills EXAM: PORTABLE CHEST 1 VIEW COMPARISON:  05/24/2018 chest radiograph. FINDINGS: Stable configuration of left ventricular assist device, intact sternotomy wires, 2 lead left subclavian ICD and right internal jugular central venous catheter with tip in the lower third of the SVC. Stable cardiomediastinal silhouette with mild cardiomegaly. No pneumothorax. Stable trace bilateral pleural effusions. No pulmonary edema. Minimal right basilar scarring versus atelectasis. No consolidative airspace disease. IMPRESSION: 1. No acute consolidative airspace disease. 2. Stable cardiomegaly without pulmonary edema. 3. Stable trace bilateral pleural effusions. 4. Minimal right basilar scarring versus atelectasis. Electronically Signed   By: Ilona Sorrel M.D.   On: 05/27/2018 22:05     Medications:     Scheduled Medications: . sodium chloride   Intravenous Once  .  aspirin EC  81 mg Oral Daily  . atorvastatin  40 mg Oral q1800  . calcitRIOL  0.5 mcg Oral Q M,W,F  . Chlorhexidine Gluconate Cloth  6 each Topical Q0600  . Chlorhexidine Gluconate Cloth  6 each Topical Q0600  . citalopram  20 mg Oral Daily  . darbepoetin (ARANESP) injection - DIALYSIS  40 mcg Intravenous Q Fri-HD  . dronabinol  2.5 mg Oral BID AC  . erythromycin  250 mg Oral TID  . feeding supplement (NEPRO CARB STEADY)  237 mL Oral BID BM  . Influenza vac split quadrivalent PF  0.5 mL Intramuscular Tomorrow-1000  . insulin aspart  5 Units Subcutaneous TID WC  . insulin glargine  10 Units Subcutaneous Daily  . isosorbide-hydrALAZINE  1.5 tablet Oral 3 times per day on Sun Tue Thu Sat  . metoCLOPramide  10 mg Oral TID  . pantoprazole  40 mg Oral BID    Infusions: . heparin 1,250 Units/hr (05/29/18 0346)  . piperacillin-tazobactam (ZOSYN)  IV 3.375 g (05/28/18 2222)  . vancomycin Stopped (05/28/18 1448)    PRN Medications: acetaminophen, docusate sodium, LORazepam, LORazepam, ondansetron (ZOFRAN) IV, promethazine, promethazine   Patient Profile   [name] McLeanis a 52 y.o.malewith history of CAD s/p anterior MI in 2010 and NSTEMI in 3/18 with DES to pRCA and mLAD and ischemic cardiomyopathy now s/p Heartmate 3 LVAD and ESRD He was admitted in 10/18 and Heartmate 3 LVAD was placed. He had baseline CKD stage 3 likely from diabetic nephropathy and CHF. He developed peri-op AKI and ended up on dialysis. No renal recovery to date.  Admitted with malfunctioning AVF. Nephrology recommended admit.   Assessment/Plan:    1. ESRD with clotted AVF: Unable to unclot AVF in cath lab.  Planned for placement of LUE AV graft or fistula tomorrow morning (delayed from today with nausea/vomiting). No further oozing at tunneled catheter site.  - Currently dialyzing through tunneled HD cath. Tolerating well. Nephrology following 2.Chronic Systolic Heart Failure with HMIII VAD, [employer] Jude  ICD. Volume managed per dialysis. LDH 155.   - Continue bidil on non dialysis days  - Continue asa 81 mg daily.  - Coumadin on hold for vascular access. On heparin drip. INR 1.25.   - To OR today for new AVF.  3. HTN: Stable. Continue Bidil on non-HD days.  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 - Coumadin on hold for AVF placement on Tuesday.  On heparin drip 1.2  5. Diabetic gastroparesis:Nausea resolved.  - Continue reglan 5 mg tid/ac and Marinol.  - Continue Protonix bid.  - Continue eythromycin indefinitely.  - can use Ativan as needed.  6. DM: - Continue home regimen. No change. 7. DUK:[facility] bilateral disease on ABIs in 8/18. Denies claudication currently, no pedal ulcers. No change. 8. Atrial flutter: Paroxysmal.Remains in NSR.  - On heparin drip while off coumadin. - 1.2  9. CAD: No chest pain. - Continue statin and asa.  10. Difficult IV access: Would eventually like a port placed.  11. Anemia: In setting of bleeding from catheter site, now resolved.  No overt GI bleeding.   - 9/23 receiived 2UPRBs. Appropriate rise  12. Fever: Tmax 99.5  Blood CX NGTD. Continue vancomycin/Zosyn started. WBC 9.8    I reviewed the LVAD parameters from today, and compared the results to the patient's prior recorded data.  No programming changes were made.  The LVAD is functioning within specified parameters.  The patient performs LVAD self-test daily.  LVAD interrogation was negative for any significant power changes, alarms or PI events/speed drops.  LVAD equipment check completed and is in good working order.  Back-up equipment present.   LVAD education done on emergency procedures and precautions and reviewed exit site care.  Length of Stay: 6  Amy Clegg, NP 05/29/2018, 9:07 AM  VAD Team --- VAD ISSUES ONLY--- Pager [phone] (7am - 7am)  Advanced Heart Failure Team  Pager [phone] (M-F; 7a - 4p)  Please contact [facility] for night-coverage after hours (4p  -7a ) and weekends on [email or web address]  Patient seen with NP, agree with the above note.   He is afebrile today, WBCs not elevated.  Cultures negative so far.  He is on empiric vanco/Zosyn.  If cultures stay negative, stop Zosyn tomorrow and continue vancomycin for total of 5 days (can get with HD).   Going for AVG versus AVF today with Dr. Donzetta Matters.   Nausea/vomiting resolved, suspect gastroparesis.   No further oozing from portacath.  Suspect this was cause of anemia, appropriate rise in hgb with transfusion yesterday.   [name] 05/29/2018

## 2018-05-29 NOTE — Anesthesia Preprocedure Evaluation (Addendum)
Anesthesia Evaluation  Patient identified by MRN, date of birth, ID band Patient awake    Reviewed: Allergy & Precautions, H&P , NPO status , Patient's Chart, lab work & pertinent test results  Airway Mallampati: II  TM Distance: >3 FB Neck ROM: Full    Dental no notable dental hx. (+) Edentulous Upper, Edentulous Lower, Dental Advisory Given   Pulmonary neg pulmonary ROS,    Pulmonary exam normal breath sounds clear to auscultation       Cardiovascular hypertension, Pt. on medications + CAD, + Past MI and +CHF  + Cardiac Defibrillator  Rhythm:Regular Rate:Normal  LVAD   Neuro/Psych negative neurological ROS  negative psych ROS   GI/Hepatic Neg liver ROS, GERD  Medicated and Controlled,  Endo/Other  diabetes, Insulin Dependent  Renal/GU ESRF and DialysisRenal disease  negative genitourinary   Musculoskeletal   Abdominal   Peds  Hematology negative hematology ROS (+) anemia ,   Anesthesia Other Findings   Reproductive/Obstetrics negative OB ROS                           Anesthesia Physical Anesthesia Plan  ASA: IV  Anesthesia Plan: MAC   Post-op Pain Management:    Induction: Intravenous  PONV Risk Score and Plan: 2 and Propofol infusion, Midazolam and Ondansetron  Airway Management Planned: Simple Face Mask  Additional Equipment:   Intra-op Plan:   Post-operative Plan:   Informed Consent: I have reviewed the patients History and Physical, chart, labs and discussed the procedure including the risks, benefits and alternatives for the proposed anesthesia with the patient or authorized representative who has indicated his/her understanding and acceptance.   Dental advisory given  Plan Discussed with: CRNA, Anesthesiologist and Surgeon  Anesthesia Plan Comments:        Anesthesia Quick Evaluation

## 2018-05-29 NOTE — Anesthesia Procedure Notes (Signed)
Procedure Name: MAC Date/Time: 05/29/2018 11:54 AM Performed by: Carney Living, CRNA Pre-anesthesia Checklist: Patient identified, Emergency Drugs available, Suction available, Patient being monitored and Timeout performed Patient Re-evaluated:Patient Re-evaluated prior to induction Oxygen Delivery Method: Simple face mask

## 2018-05-29 NOTE — Progress Notes (Signed)
  Progress Note    05/29/2018 9:48 AM Day of Surgery  Subjective:  Feeling better today  Vitals:   05/29/18 0544 05/29/18 0737  BP:  (!) 139/122  Pulse:  87  Resp: (!) 37 (!) 22  Temp:  98.7 F (37.1 C)  SpO2:  97%    Physical Exam: aaox3 Non labored respirations Palpable left brachial artery   CBC    Component Value Date/Time   WBC 9.8 05/29/2018 0410   RBC 3.68 (L) 05/29/2018 0410   HGB 10.2 (L) 05/29/2018 0410   HGB 11.7 (L) 09/07/2016 1457   HCT 32.6 (L) 05/29/2018 0410   HCT 37.5 09/07/2016 1457   PLT 199 05/29/2018 0410   PLT 279 09/07/2016 1457   MCV 88.6 05/29/2018 0410   MCV 81 09/07/2016 1457   MCH 27.7 05/29/2018 0410   MCHC 31.3 05/29/2018 0410   RDW 17.0 (H) 05/29/2018 0410   RDW 14.9 09/07/2016 1457   LYMPHSABS 1.2 03/19/2018 0243   LYMPHSABS 1.3 09/07/2016 1457   MONOABS 1.2 (H) 03/19/2018 0243   EOSABS 0.4 03/19/2018 0243   EOSABS 0.2 09/07/2016 1457   BASOSABS 0.0 03/19/2018 0243   BASOSABS 0.0 09/07/2016 1457    BMET    Component Value Date/Time   NA 134 (L) 05/29/2018 0410   NA 144 05/11/2017 0938   K 4.3 05/29/2018 0410   CL 98 05/29/2018 0410   CO2 26 05/29/2018 0410   GLUCOSE 149 (H) 05/29/2018 0410   BUN 20 05/29/2018 0410   BUN 68 (H) 05/11/2017 0938   CREATININE 4.90 (H) 05/29/2018 0410   CREATININE 1.63 (H) 11/09/2016 1505   CALCIUM 8.2 (L) 05/29/2018 0410   CALCIUM 8.7 06/27/2017 1152   GFRNONAA 12 (L) 05/29/2018 0410   GFRNONAA 48 (L) 11/09/2016 1505   GFRAA 14 (L) 05/29/2018 0410   GFRAA 55 (L) 11/09/2016 1505    INR    Component Value Date/Time   INR 1.18 05/29/2018 0410     Intake/Output Summary (Last 24 hours) at 05/29/2018 0948 Last data filed at 05/29/2018 0925 Gross per 24 hour  Intake 1839.78 ml  Output 3259 ml  Net -1419.22 ml     Assessment:  52 y.o. male is in need of permanent access  Plan: Left arm avf vs graft today in [place] C. Donzetta Matters, MD [facility]  [place] Office: [phone] Pager: [phone]  05/29/2018 9:48 AM

## 2018-05-29 NOTE — Transfer of Care (Signed)
Immediate Anesthesia Transfer of Care Note  Patient: [name]  Procedure(s) Performed: ARTERIOVENOUS (AV) FISTULA CREATION  LEFT UPPER EXTREMITY (Left Arm Upper)  Patient Location: PACU  Anesthesia Type:MAC  Level of Consciousness: awake, alert , oriented and patient cooperative  Airway & Oxygen Therapy: Patient Spontanous Breathing and Patient connected to nasal cannula oxygen  Post-op Assessment: Report given to RN, Post -op Vital signs reviewed and stable and Patient moving all extremities X 4  Post vital signs: Reviewed and stable  Last Vitals:  Vitals Value Taken Time  BP 89/70 05/29/2018  1:15 PM  Temp    Pulse 71 05/29/2018  1:17 PM  Resp 15 05/29/2018  1:17 PM  SpO2 100 % 05/29/2018  1:17 PM  Vitals shown include unvalidated device data.  Last Pain:  Vitals:   05/29/18 0737  TempSrc: Oral  PainSc: 0-No pain         Complications: No apparent anesthesia complications

## 2018-05-29 NOTE — Progress Notes (Addendum)
ANTICOAGULATION CONSULT NOTE  Pharmacy Consult:  Heparin Indication:  LVAD  Allergies  Allergen Reactions  . Metformin And Related Diarrhea    Patient Measurements: Height: 5' 8"  (172.7 cm) Weight: 178 lb 5.6 oz (80.9 kg) IBW/kg (Calculated) : 68.4 Heparin Dosing Weight: 80 kg  Vital Signs: Temp: 99.1 F (37.3 C) (09/24 0346) Temp Source: Oral (09/24 0346) BP: 102/80 (09/24 0346)  Labs: Recent Labs    05/27/18 0400 05/28/18 0500 05/29/18 0410  HGB 7.5* 7.0* 10.2*  HCT 24.4* 22.5* 32.6*  PLT 163 170 199  LABPROT 16.1* 15.6* 14.9  INR 1.31 1.25 1.18  HEPARINUNFRC 0.20* 0.28* 0.25*  CREATININE 7.18* 8.89* 4.90*    Estimated Creatinine Clearance: 16.9 mL/min (A) (by C-G formula based on SCr of 4.9 mg/dL (H)).  Assessment: 69 yoM with PMH HF s/p HM3 and Factor 5 Leiden on warfarin PTA. Warfarin on hold for HD access and pt on IV heparin bridge. Heparin level 0.25 this morning, INR subtherapeutic as expected at 1.18. Hgb improved today to 10 s/p pRBCs. VVS planning for new graft to LUE todau.   Goal of Therapy:  Heparin level 0.3-0.7 units/ml  INR 2-3 Monitor platelets by anticoagulation protocol: Yes   Plan:  -Increase heparin slightly to 1300 units/hr -Will F/U resuming post-VVS procedure  ADDENDUM: IV heparin continued through procedure. Recheck heparin level with am labs. Discussed with HF team and will resume Coumadin tonight. PTA Dose = 36m daily.  Plan: Heparin 1300 units/hr Coumadin 7.543mPO x1 tonight  MiArrie SenatePharmD, BCPS Clinical Pharmacist [phone] check AMION for all [place] 05/29/2018

## 2018-05-29 NOTE — Progress Notes (Signed)
[facility] NEPHROLOGY PROGRESS NOTE  Assessment/ Plan: Pt is a 52 y.o. yo male admitted with clotted right arm AV graft, underwent OR on 9/19,clotted AVG unable to salvage due to severely atretic draining veins. MWF [facility] 4h 63mn 78.5kg (raising edw to ~80 kg here) 3k/2Ca bath RUA AVG Hep none - calc 0.5ug tiw - no ESA  #Clotted right arm AVG: Plan for left upper extremity AVG/aVF today by vascular.  He has L IJ TDC for dialysis. Unable to go to OR yesterday because of nausea.   # ESRD: HD MWF.  Had HD yesterday with 3 L off. Plan for dialysis tomorrow.  EDW to 80 kg.    # Anemia: Hemoglobin 10.2, received 2 units of PRBC on 9/23.  No further bleeding from catheter site noticed.  Continue Aranesp.  # Secondary hyperparathyroidism: Calcium, phos acceptable.  Resume calcitriol.  # HTN/volume: Blood pressure acceptable.    Subjective: Seen and examined at bedside.  Plan for OR today.  Denies chest pain, shortness of breath, nausea vomiting. Objective Vital signs in last 24 hours: Vitals:   05/29/18 0000 05/29/18 0346 05/29/18 0544 05/29/18 0737  BP: 92/77 102/80  (!) 139/122  Pulse:    87  Resp: (!) 26  (!) 37 (!) 22  Temp: 99.5 F (37.5 C) 99.1 F (37.3 C)  98.7 F (37.1 C)  TempSrc: Oral Oral  Oral  SpO2: 98% 99%  97%  Weight:   80.9 kg   Height:       Weight change: 0 kg  Intake/Output Summary (Last 24 hours) at 05/29/2018 0855 Last data filed at 05/29/2018 0346 Gross per 24 hour  Intake 1768.99 ml  Output 3259 ml  Net -1490.01 ml       Labs: Basic Metabolic Panel: Recent Labs  Lab 05/27/18 0400 05/28/18 0500 05/29/18 0410  NA 137 136 134*  K 3.7 3.9 4.3  CL 100 100 98  CO2 25 24 26   GLUCOSE 112* 69* 149*  BUN 35* 45* 20  CREATININE 7.18* 8.89* 4.90*  CALCIUM 8.2* 8.2* 8.2*  PHOS  --   --  3.3   Liver Function Tests: Recent Labs  Lab 05/23/18 1625 05/29/18 0410  AST 29  --   ALT 40  --   ALKPHOS 119  --   BILITOT 0.9  --    PROT 7.1  --   ALBUMIN 3.3* 2.7*   No results for input(s): LIPASE, AMYLASE in the last 168 hours. No results for input(s): AMMONIA in the last 168 hours. CBC: Recent Labs  Lab 05/26/18 0451 05/26/18 1415 05/27/18 0400 05/28/18 0500 05/29/18 0410  WBC 8.4 8.1 9.4 10.4 9.8  HGB 9.3* 8.4* 7.5* 7.0* 10.2*  HCT 30.7* 27.5* 24.4* 22.5* 32.6*  MCV 91.6 92.0 90.7 90.0 88.6  PLT 182 167 163 170 199   Cardiac Enzymes: No results for input(s): CKTOTAL, CKMB, CKMBINDEX, TROPONINI in the last 168 hours. CBG: Recent Labs  Lab 05/28/18 1657 05/28/18 1728 05/28/18 2006 05/28/18 2204 05/29/18 0812  GLUCAP 68* 100* 188* 140* 114*    Iron Studies: No results for input(s): IRON, TIBC, TRANSFERRIN, FERRITIN in the last 72 hours. Studies/Results: Dg Chest Port 1 View  Result Date: 05/27/2018 CLINICAL DATA:  Fever and chills EXAM: PORTABLE CHEST 1 VIEW COMPARISON:  05/24/2018 chest radiograph. FINDINGS: Stable configuration of left ventricular assist device, intact sternotomy wires, 2 lead left subclavian ICD and right internal jugular central venous catheter with tip in the lower third of the  SVC. Stable cardiomediastinal silhouette with mild cardiomegaly. No pneumothorax. Stable trace bilateral pleural effusions. No pulmonary edema. Minimal right basilar scarring versus atelectasis. No consolidative airspace disease. IMPRESSION: 1. No acute consolidative airspace disease. 2. Stable cardiomegaly without pulmonary edema. 3. Stable trace bilateral pleural effusions. 4. Minimal right basilar scarring versus atelectasis. Electronically Signed   By: Ilona Sorrel M.D.   On: 05/27/2018 22:05    Medications: Infusions: . heparin 1,250 Units/hr (05/29/18 0346)  . piperacillin-tazobactam (ZOSYN)  IV 3.375 g (05/28/18 2222)  . vancomycin Stopped (05/28/18 1448)    Scheduled Medications: . sodium chloride   Intravenous Once  . aspirin EC  81 mg Oral Daily  . atorvastatin  40 mg Oral q1800  .  calcitRIOL  0.5 mcg Oral Q M,W,F  . Chlorhexidine Gluconate Cloth  6 each Topical Q0600  . Chlorhexidine Gluconate Cloth  6 each Topical Q0600  . citalopram  20 mg Oral Daily  . darbepoetin (ARANESP) injection - DIALYSIS  40 mcg Intravenous Q Fri-HD  . dronabinol  2.5 mg Oral BID AC  . erythromycin  250 mg Oral TID  . feeding supplement (NEPRO CARB STEADY)  237 mL Oral BID BM  . Influenza vac split quadrivalent PF  0.5 mL Intramuscular Tomorrow-1000  . insulin aspart  5 Units Subcutaneous TID WC  . insulin glargine  10 Units Subcutaneous Daily  . isosorbide-hydrALAZINE  1.5 tablet Oral 3 times per day on Sun Tue Thu Sat  . metoCLOPramide  10 mg Oral TID  . pantoprazole  40 mg Oral BID    have reviewed scheduled and prn medications.  Physical Exam: General:NAD, comfortable Heart:mechanical sound,  Lungs: Clear bilateral, no crackle or wheeze Abdomen:soft, Non-tender, non-distended Extremities:No edema Dialysis Access: Left IJ tunnel catheter site with no bleeding.  Right upper extremity AV graft no thrill or bruit.  Foy Mungia Prasad Daesia Zylka 05/29/2018,8:55 AM  LOS: 6 days

## 2018-05-29 NOTE — Plan of Care (Signed)
  Problem: Education: Goal: Knowledge of General Education information will improve Description Including pain rating scale, medication(s)/side effects and non-pharmacologic comfort measures Outcome: Progressing   Problem: Health Behavior/Discharge Planning: Goal: Ability to manage health-related needs will improve Outcome: Progressing   Problem: Clinical Measurements: Goal: Ability to maintain clinical measurements within normal limits will improve Outcome: Progressing Goal: Will remain free from infection Outcome: Progressing Goal: Diagnostic test results will improve Outcome: Progressing Goal: Respiratory complications will improve Outcome: Progressing Goal: Cardiovascular complication will be avoided Outcome: Progressing   Problem: Activity: Goal: Risk for activity intolerance will decrease Outcome: Progressing   Problem: Nutrition: Goal: Adequate nutrition will be maintained Outcome: Progressing   Problem: Coping: Goal: Level of anxiety will decrease Outcome: Progressing   Problem: Elimination: Goal: Will not experience complications related to bowel motility Outcome: Progressing Goal: Will not experience complications related to urinary retention Outcome: Progressing   Problem: Pain Managment: Goal: General experience of comfort will improve Outcome: Progressing   Problem: Safety: Goal: Ability to remain free from injury will improve Outcome: Progressing   Problem: Skin Integrity: Goal: Risk for impaired skin integrity will decrease Outcome: Progressing   Problem: Cardiac: Goal: LVAD will function as expected and patient will experience no clinical alarms Outcome: Progressing   Problem: Education: Goal: Patient will understand all VAD equipment and how it functions Outcome: Progressing Goal: Patient will be able to verbalize current INR target range and antiplatelet therapy for discharge home Outcome: Progressing

## 2018-05-29 NOTE — Op Note (Signed)
    Patient name: [name] MRN: [id number] DOB: [birth year] Sex: male  05/29/2018 Pre-operative Diagnosis: esrd Post-operative diagnosis:  Same Surgeon:  Erlene Quan C. Donzetta Matters, MD Assistant: Leontine Locket, PA Procedure Performed: Left arm first stage basilic vein transposition fistula  Indications: 51 year old male has a history of end-stage renal disease also has a ventricular assist device in place.  He has a failed fistula as well as graft in the right upper extremity is now indicated for left arm access.  By preoperative vein mapping there is marginal basilic vein.  Findings: The basilic vein was noted to be patent throughout its course and on open exploration measured at least 3 mm external diameter and easily dilated to 4 mm.  At completion there was a palpable thrill in the vein and he had continued signal in the radial artery at the wrist although no pulsatility given that he has a ventricular assist device in place.   Procedure:  The patient was identified in the holding area and taken to the operating room where he was placed supine the operating table and MAC anesthesia was induced.  He was sterilely prepped and draped in the left upper extremity in the usual fashion he was given antibiotics and a timeout was called.  Heparin was allowed to run throughout the operation given existence of ventricular assist device.  We used ultrasound to identify the basilic vein as well as the brachial artery given that I could not palpate that either.  Both seemed adequate for fistula creation by ultrasound evaluation.  The area between the 2 was anesthetized with 1% lidocaine with epinephrine and a transverse incision was made.  We first dissected down onto the vein divided branches between clips and ties and marked for orientation.  We then dissected through the deep fascia identified the brachial artery and placed a vessel loop around this.  The vein was then transected his far down past the  antecubitum is possible.  It was dilated up to 4 mm flushed with heparinized saline and spatulated and clamped.  The artery was clamped distally and proximally opened longitudinally.  Given that he was previously heparinized I did not flushed with heparinized saline in this instance.  I did so the vein and so 6-0 Prolene suture.  Prior to completion allowed antegrade and backbleeding.  Upon completion there was a thrill in the vein confirmed with Doppler and there was also continuous signal at the radial artery at the wrist consistent with preoperative exam.  With this I irrigated the wound obtained hemostasis and closed in layers with Vicryl and Monocryl.  Dermabond was placed to the level of the skin.  He was allowed to awaken from anesthesia having tolerated the procedure well without immediate complication.  All counts were correct at completion.  EBL: 10 cc   Hisako Bugh C. Donzetta Matters, MD [facility] [place] Office: [phone] Pager: [phone]

## 2018-05-29 NOTE — Progress Notes (Signed)
2C RN at bedside with patient with LVAD

## 2018-05-29 NOTE — Anesthesia Postprocedure Evaluation (Signed)
Anesthesia Post Note  Patient: [name]  Procedure(s) Performed: ARTERIOVENOUS (AV) FISTULA CREATION  LEFT UPPER EXTREMITY (Left Arm Upper)     Patient location during evaluation: PACU Anesthesia Type: MAC Level of consciousness: awake and alert Pain management: pain level controlled Vital Signs Assessment: post-procedure vital signs reviewed and stable Respiratory status: spontaneous breathing, nonlabored ventilation, respiratory function stable and patient connected to nasal cannula oxygen Cardiovascular status: stable and blood pressure returned to baseline Postop Assessment: no apparent nausea or vomiting Anesthetic complications: no    Last Vitals:  Vitals:   05/29/18 1330 05/29/18 1342  BP: (!) 81/54 93/60  Pulse: 69 69  Resp: 14 20  Temp:  36.7 C  SpO2: 97% 98%    Last Pain:  Vitals:   05/29/18 0737  TempSrc: Oral  PainSc: 0-No pain                 Oceania Noori,W. EDMOND

## 2018-05-30 ENCOUNTER — Ambulatory Visit (HOSPITAL_COMMUNITY): Payer: Self-pay

## 2018-05-30 ENCOUNTER — Encounter (HOSPITAL_COMMUNITY): Payer: Self-pay

## 2018-05-30 ENCOUNTER — Telehealth: Payer: Self-pay | Admitting: Vascular Surgery

## 2018-05-30 ENCOUNTER — Ambulatory Visit (HOSPITAL_COMMUNITY): Admit: 2018-05-30 | Payer: BLUE CROSS/BLUE SHIELD | Admitting: Vascular Surgery

## 2018-05-30 ENCOUNTER — Encounter (HOSPITAL_COMMUNITY): Payer: Self-pay | Admitting: Vascular Surgery

## 2018-05-30 LAB — RENAL FUNCTION PANEL
Albumin: 2.6 g/dL — ABNORMAL LOW (ref 3.5–5.0)
Anion gap: 8 (ref 5–15)
BUN: 30 mg/dL — AB (ref 6–20)
CO2: 25 mmol/L (ref 22–32)
CREATININE: 7.01 mg/dL — AB (ref 0.61–1.24)
Calcium: 8.3 mg/dL — ABNORMAL LOW (ref 8.9–10.3)
Chloride: 102 mmol/L (ref 98–111)
GFR calc Af Amer: 9 mL/min — ABNORMAL LOW (ref >60)
GFR calc non Af Amer: 8 mL/min — ABNORMAL LOW (ref >60)
GLUCOSE: 116 mg/dL — AB (ref 70–99)
PHOSPHORUS: 3.7 mg/dL (ref 2.5–4.6)
POTASSIUM: 4.4 mmol/L (ref 3.5–5.1)
SODIUM: 135 mmol/L (ref 135–145)

## 2018-05-30 LAB — CBC
HCT: 30.4 % — ABNORMAL LOW (ref 39.0–52.0)
Hemoglobin: 9.5 g/dL — ABNORMAL LOW (ref 13.0–17.0)
MCH: 27.9 pg (ref 26.0–34.0)
MCHC: 31.3 g/dL (ref 30.0–36.0)
MCV: 89.1 fL (ref 78.0–100.0)
PLATELETS: 217 10*3/uL (ref 150–400)
RBC: 3.41 MIL/uL — ABNORMAL LOW (ref 4.22–5.81)
RDW: 16.5 % — AB (ref 11.5–15.5)
WBC: 10.4 10*3/uL (ref 4.0–10.5)

## 2018-05-30 LAB — GLUCOSE, CAPILLARY
GLUCOSE-CAPILLARY: 119 mg/dL — AB (ref 70–99)
GLUCOSE-CAPILLARY: 223 mg/dL — AB (ref 70–99)
GLUCOSE-CAPILLARY: 161 mg/dL — AB (ref 70–99)
Glucose-Capillary: 140 mg/dL — ABNORMAL HIGH (ref 70–99)

## 2018-05-30 LAB — PROTIME-INR
INR: 1.14
Prothrombin Time: 14.5 seconds (ref 11.4–15.2)

## 2018-05-30 LAB — LACTATE DEHYDROGENASE: LDH: 152 U/L (ref 98–192)

## 2018-05-30 LAB — HEPARIN LEVEL (UNFRACTIONATED): HEPARIN UNFRACTIONATED: 0.28 [IU]/mL — AB (ref 0.30–0.70)

## 2018-05-30 SURGERY — A/V SHUNTOGRAM
Anesthesia: LOCAL

## 2018-05-30 MED ORDER — VANCOMYCIN HCL IN DEXTROSE 750-5 MG/150ML-% IV SOLN
INTRAVENOUS | Status: AC
  Administered 2018-05-30: 750 mg
  Filled 2018-05-30: qty 150

## 2018-05-30 MED ORDER — PENTAFLUOROPROP-TETRAFLUOROETH EX AERO: 1.0000 "application " | INHALATION_SPRAY | CUTANEOUS | Status: DC | PRN

## 2018-05-30 MED ORDER — HEPARIN SODIUM (PORCINE) 1000 UNIT/ML IJ SOLN
INTRAMUSCULAR | Status: AC
  Administered 2018-05-30: 3400 [IU]
  Filled 2018-05-30: qty 4

## 2018-05-30 MED ORDER — WARFARIN SODIUM 7.5 MG PO TABS
7.5000 mg | ORAL_TABLET | Freq: Once | ORAL | Status: AC
  Administered 2018-05-30: 7.5 mg via ORAL
  Filled 2018-05-30 (×2): qty 1

## 2018-05-30 MED ORDER — SODIUM CHLORIDE 0.9 % IV SOLN: 100.0000 mL | INTRAVENOUS | Status: DC | PRN

## 2018-05-30 MED ORDER — ALTEPLASE 2 MG IJ SOLR: 2.0000 mg | Freq: Once | INTRAMUSCULAR | Status: DC | PRN

## 2018-05-30 MED ORDER — LIDOCAINE HCL (PF) 1 % IJ SOLN: 5.0000 mL | INTRAMUSCULAR | Status: DC | PRN

## 2018-05-30 MED ORDER — LIDOCAINE-PRILOCAINE 2.5-2.5 % EX CREA: 1.0000 "application " | TOPICAL_CREAM | CUTANEOUS | Status: DC | PRN

## 2018-05-30 MED ORDER — CALCITRIOL 0.5 MCG PO CAPS
ORAL_CAPSULE | ORAL | Status: AC
  Administered 2018-05-30: 0.5 ug
  Filled 2018-05-30: qty 1

## 2018-05-30 MED ORDER — HEPARIN SODIUM (PORCINE) 1000 UNIT/ML DIALYSIS: 1000.0000 [IU] | INTRAMUSCULAR | Status: DC | PRN

## 2018-05-30 NOTE — Progress Notes (Signed)
   Left hand is warm and sensorimotor in tact. There is a palpable thrill in the left upper arm. Will have Mr. Micheals f/u in 6 weeks with dialysis access duplex and plan for second stage procedure if it has matured at that time.   Salam Micucci C. Donzetta Matters, MD [facility] Office: [phone] Pager: [phone]

## 2018-05-30 NOTE — Telephone Encounter (Signed)
sch appt spk to pt may resch due to HD 07/13/18 3pm Dialysis Duplex 4pm p/o PA

## 2018-05-30 NOTE — Progress Notes (Signed)
ANTICOAGULATION CONSULT NOTE  Pharmacy Consult:  Heparin/Warfarin Indication:  LVAD  Allergies  Allergen Reactions  . Metformin And Related Diarrhea    Patient Measurements: Height: 5' 8"  (172.7 cm) Weight: 180 lb 5.4 oz (81.8 kg) IBW/kg (Calculated) : 68.4 Heparin Dosing Weight: 80 kg  Vital Signs: Temp: 98.7 F (37.1 C) (09/25 0426) Temp Source: Oral (09/25 0426) BP: 97/68 (09/25 0426) Pulse Rate: 91 (09/24 2331)  Labs: Recent Labs    05/28/18 0500 05/29/18 0410 05/30/18 0556  HGB 7.0* 10.2*  --   HCT 22.5* 32.6*  --   PLT 170 199  --   LABPROT 15.6* 14.9 14.5  INR 1.25 1.18 1.14  HEPARINUNFRC 0.28* 0.25* 0.28*  CREATININE 8.89* 4.90* 7.01*    Estimated Creatinine Clearance: 11.8 mL/min (A) (by C-G formula based on SCr of 7.01 mg/dL (H)).  Assessment: 18 yoM with PMH HF s/p HM3 and Factor 5 Leiden on warfarin PTA. Warfarin had been on hold for HD access and pt on IV heparin bridge but has now been restarted. Heparin level 0.28 this morning, INR subtherapeutic as expected at 1.14.   *PTA Warfarin Dose = 74m daily  Goal of Therapy:  Heparin level 0.3-0.7 units/ml  INR 2-3 Monitor platelets by anticoagulation protocol: Yes   Plan: -Increase heparin to 1350 units/hr -Warfarin 7.532mPO x1 tonight -Daily INR, heparin level, CBC, LDH  MiArrie SenatePharmD, BCPS Clinical Pharmacist [phone] check AMION for all [facility] 05/30/2018

## 2018-05-30 NOTE — Procedures (Signed)
Patient was seen on dialysis and the procedure was supervised.  BFR 400  Via TDC BP is  20V systolic.   Patient appears to be tolerating treatment well. 3K, 3-4 L UF as tolerated.  S/p LUE AVF on 9/24 by Dr. Donzetta Matters.  Hans Rusher Tanna Furry 05/30/2018

## 2018-05-30 NOTE — Progress Notes (Addendum)
Patient ID: [name], male   DOB: 12/21/1965, 52 y.o.   MRN: [id number]   Advanced Heart Failure VAD Team Note  PCP-Cardiologist: No primary care provider on file.   Subjective:     RUE AVG thrombosed, unable to restore flow.   S/P LUE AVG placememnt.   Denies SOB. Denies nausea. Having some pain in LUE.    LVAD INTERROGATION:  HeartMate 3 LVAD:   Flow 4.4 liters/min, speed 5500, power 4, PI 2.6        Objective:    Vital Signs:   Temp:  [97.4 F (36.3 C)-98.9 F (37.2 C)] 98.7 F (37.1 C) (09/25 0426) Pulse Rate:  [69-91] 91 (09/24 2331) Resp:  [13-30] 15 (09/25 0426) BP: (81-100)/(54-74) 97/68 (09/25 0426) SpO2:  [96 %-100 %] 97 % (09/24 1924) Weight:  [81.8 kg] 81.8 kg (09/25 0123) Last BM Date: 05/26/18 Mean arterial Pressure 80-90s   Intake/Output:   Intake/Output Summary (Last 24 hours) at 05/30/2018 0737 Last data filed at 05/29/2018 2300 Gross per 24 hour  Intake 609.26 ml  Output 25 ml  Net 584.26 ml     Physical Exam   Physical Exam: GENERAL:In bed. NAD.  HEENT: normal  NECK: Supple, JVP  ~10 .  Carotids 2+ bilaterally, no bruits.  No lymphadenopathy or thyromegaly appreciated.   CARDIAC:  Mechanical heart sounds with LVAD hum present. R upper chest tunneled catheter.  LUNGS:  Clear to auscultation bilaterally.  ABDOMEN:  Soft, round, nontender, positive bowel sounds x4.     LVAD exit site: well-healed and incorporated.  Dressing dry and intact.  No erythema or drainage.  Stabilization device present and accurately applied.  Driveline dressing is being changed daily per sterile technique. EXTREMITIES:  Warm and dry, no cyanosis, clubbing, rash or edema . LUE dermabond intact.  NEUROLOGIC:  Alert and oriented x 4.MAE x4.     Telemetry   NSR 80-90s personally reviewed.   EKG    No new tracings.   Labs   Basic Metabolic Panel: Recent Labs  Lab 05/26/18 0451 05/27/18 0400 05/28/18 0500 05/29/18 0410 05/30/18 0556  NA 139 137  136 134* 135  K 4.0 3.7 3.9 4.3 4.4  CL 101 100 100 98 102  CO2 28 25 24 26 25   GLUCOSE 107* 112* 69* 149* 116*  BUN 22* 35* 45* 20 30*  CREATININE 4.60* 7.18* 8.89* 4.90* 7.01*  CALCIUM 8.5* 8.2* 8.2* 8.2* 8.3*  PHOS  --   --   --  3.3 3.7    Liver Function Tests: Recent Labs  Lab 05/23/18 1625 05/29/18 0410 05/30/18 0556  AST 29  --   --   ALT 40  --   --   ALKPHOS 119  --   --   BILITOT 0.9  --   --   PROT 7.1  --   --   ALBUMIN 3.3* 2.7* 2.6*   No results for input(s): LIPASE, AMYLASE in the last 168 hours. No results for input(s): AMMONIA in the last 168 hours.  CBC: Recent Labs  Lab 05/26/18 1415 05/27/18 0400 05/28/18 0500 05/29/18 0410 05/30/18 0555  WBC 8.1 9.4 10.4 9.8 10.4  HGB 8.4* 7.5* 7.0* 10.2* 9.5*  HCT 27.5* 24.4* 22.5* 32.6* 30.4*  MCV 92.0 90.7 90.0 88.6 89.1  PLT 167 163 170 199 217    INR: Recent Labs  Lab 05/26/18 0451 05/27/18 0400 05/28/18 0500 05/29/18 0410 05/30/18 0556  INR 1.47 1.31 1.25 1.18 1.14  Other results:     Imaging   No results found.   Medications:     Scheduled Medications: . sodium chloride   Intravenous Once  . aspirin EC  81 mg Oral Daily  . atorvastatin  40 mg Oral q1800  . calcitRIOL  0.5 mcg Oral Q M,W,F  . Chlorhexidine Gluconate Cloth  6 each Topical Q0600  . Chlorhexidine Gluconate Cloth  6 each Topical Q0600  . citalopram  20 mg Oral Daily  . darbepoetin (ARANESP) injection - DIALYSIS  40 mcg Intravenous Q Fri-HD  . dronabinol  2.5 mg Oral BID AC  . erythromycin  250 mg Oral TID  . feeding supplement (NEPRO CARB STEADY)  237 mL Oral BID BM  . Influenza vac split quadrivalent PF  0.5 mL Intramuscular Tomorrow-1000  . insulin aspart  5 Units Subcutaneous TID WC  . insulin glargine  10 Units Subcutaneous Daily  . isosorbide-hydrALAZINE  1.5 tablet Oral 3 times per day on Sun Tue Thu Sat  . metoCLOPramide  10 mg Oral TID  . pantoprazole  40 mg Oral BID  . Warfarin - Pharmacist Dosing  Inpatient   Does not apply q1800    Infusions: . heparin 1,300 Units/hr (05/30/18 0604)  . piperacillin-tazobactam (ZOSYN)  IV 3.375 g (05/29/18 2204)  . 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 He had baseline CKD stage 3 likely from diabetic nephropathy and CHF. He developed peri-op AKI and ended up on dialysis. No renal recovery to date.  Admitted with malfunctioning AVF. Nephrology recommended admit.   Assessment/Plan:    1. ESRD with clotted AVF: Unable to unclot AVF in cath lab.   S/P AVG LUE. Plan for 2 stage 6 weeks.  - Currently dialyzing through tunneled HD cath. . Nephrology following - HD M-W-F 2.Chronic Systolic Heart Failure with HMIII VAD, St Jude ICD. Volume managed per dialysis.  -LDH 152 - Continue bidil on non dialysis days  - Continue asa 81 mg daily.  -On heparin and coumadin restarted last night. INR 1.14 On heparin drip. INR 1.25.   - S/P AVF LUE 9/24. .  3. HTN: Stable. Continue Bidil on non-HD days.  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 - Coumadin restarted and on heparin.  5. Diabetic gastroparesis: -Nausea resolved.   - Continue reglan 5 mg tid/ac and Marinol.  - Continue Protonix bid.  - Continue eythromycin indefinitely.  - can use Ativan as needed.   6. DM: - Continue home regimen. No change. 7. HGD:JMEQASTM bilateral disease on ABIs in 8/18. Denies claudication currently, no pedal ulcers. No change. 8. Atrial flutter: Paroxysmal.Remains in NSR.  - On heparin drip.  - Coumadin restarted 9.24 -INR 1.14.  9. CAD: No chest  pain. - Continue statin and asa.  10. Difficult IV access: Would eventually like a port placed.  11. Anemia: In setting of bleeding from catheter site, now resolved.  No overt GI bleeding.   - 9/23 receiived 2UPRBs. Hgb 10.2>9.5  12. Fever: Afebrile.  Blood CX NGTD. Continue vancomycin/Zosyn started. WBC 10l.4     Home when INR therapeutic.   I reviewed  the LVAD parameters from today, and compared the results to the patient's prior recorded data.  No programming changes were made.  The LVAD is functioning within specified parameters.  The patient performs LVAD self-test daily.  LVAD interrogation was negative for any significant power changes, alarms or PI events/speed drops.  LVAD equipment check completed and is in good working order.  Back-up equipment present.   LVAD education done on emergency procedures and precautions and reviewed exit site care.  Length of Stay: [place], NP 05/30/2018, 7:37 AM  VAD Team --- VAD ISSUES ONLY--- Pager [phone] (7am - 7am)  Advanced Heart Failure Team  Pager [phone] (M-F; 7a - 4p)  Please contact [facility] for night-coverage after hours (4p -7a ) and weekends on [email or web address]  Patient seen with NP, agree with the above note.   He is afebrile today, WBCs not significantly elevated.  Cultures negative so far.  He is on empiric vanco/Zosyn.  I will stop Zosyn today and continue vancomycin for total of 5 days (can get with HD).   Got AV fistula with Dr. Donzetta Matters yesterday.   Nausea/vomiting resolved, suspect gastroparesis.   No further oozing from portacath.  Suspect this was cause of anemia, hgb stable today.   He is back on warfarin/heparin gtt.  Can stop heparin gtt when INR 1.8 and he can then go home.   Everest Brod 05/30/2018 9:40 AM

## 2018-05-30 NOTE — Plan of Care (Signed)
  Problem: Health Behavior/Discharge Planning: Goal: Ability to manage health-related needs will improve Outcome: Progressing   Problem: Clinical Measurements: Goal: Ability to maintain clinical measurements within normal limits will improve Outcome: Progressing Goal: Will remain free from infection Outcome: Progressing Goal: Diagnostic test results will improve Outcome: Progressing Goal: Respiratory complications will improve Outcome: Progressing Goal: Cardiovascular complication will be avoided Outcome: Progressing   Problem: Activity: Goal: Risk for activity intolerance will decrease Outcome: Progressing   Problem: Nutrition: Goal: Adequate nutrition will be maintained Outcome: Progressing   Problem: Coping: Goal: Level of anxiety will decrease Outcome: Progressing   Problem: Elimination: Goal: Will not experience complications related to bowel motility Outcome: Progressing Goal: Will not experience complications related to urinary retention Outcome: Progressing   Problem: Pain Managment: Goal: General experience of comfort will improve Outcome: Progressing   Problem: Safety: Goal: Ability to remain free from injury will improve Outcome: Progressing   Problem: Skin Integrity: Goal: Risk for impaired skin integrity will decrease Outcome: Progressing   Problem: Cardiac: Goal: LVAD will function as expected and patient will experience no clinical alarms Outcome: Progressing   Problem: Education: Goal: Patient will understand all VAD equipment and how it functions Outcome: Progressing Goal: Patient will be able to verbalize current INR target range and antiplatelet therapy for discharge home Outcome: Progressing

## 2018-05-30 NOTE — Progress Notes (Signed)
VAD Coordinator Procedure Note:   VAD Coordinator met patient in pre-op holding; report from [place], Physiological scientist.  Pt undergoing left upper arm AV fistula   per Dr. Donzetta Matters. Hemodynamics and VAD parameters monitored by myself and anesthesia throughout the procedure. Blood pressures were obtained with automatic cuff on right forearm and correlated with Doppler modified systolic.    Time: Doppler Auto  BP Flow PI Power Speed  Pre-procedure:  11:00 110 112/85 (92) 3.8 4.6 4.5 5500                    Secation Induction: 12:00  105/79 (88) 3.6 5.5 4.5    12:15  109/99 (91) 3.6 5.8 4.4    12:30  83/69   (76) 3.6 5.7 4.4     12:45  110/96 (102) 3.7 5.4 4.6    1300  113/78 (85) 3.9 4.5 4.6   Recovery Area: 13:15  89/70    (78) 4.4 2.8 4.6              Patient tolerated the procedure well. VAD Coordinator accompanied and remained with patient in recovery area.    Patient Disposition: 2C08  Zada Girt RN, VAD Coordinator Pager: [phone]

## 2018-05-31 LAB — CBC
HEMATOCRIT: 32.5 % — AB (ref 39.0–52.0)
HEMOGLOBIN: 10.3 g/dL — AB (ref 13.0–17.0)
MCH: 28.5 pg (ref 26.0–34.0)
MCHC: 31.7 g/dL (ref 30.0–36.0)
MCV: 89.8 fL (ref 78.0–100.0)
Platelets: 234 10*3/uL (ref 150–400)
RBC: 3.62 MIL/uL — AB (ref 4.22–5.81)
RDW: 16.3 % — ABNORMAL HIGH (ref 11.5–15.5)
WBC: 9.8 10*3/uL (ref 4.0–10.5)

## 2018-05-31 LAB — GLUCOSE, CAPILLARY
GLUCOSE-CAPILLARY: 189 mg/dL — AB (ref 70–99)
GLUCOSE-CAPILLARY: 159 mg/dL — AB (ref 70–99)
Glucose-Capillary: 94 mg/dL (ref 70–99)
Glucose-Capillary: 141 mg/dL — ABNORMAL HIGH (ref 70–99)

## 2018-05-31 LAB — RENAL FUNCTION PANEL
ANION GAP: 10 (ref 5–15)
Albumin: 2.7 g/dL — ABNORMAL LOW (ref 3.5–5.0)
BUN: 19 mg/dL (ref 6–20)
CALCIUM: 8.5 mg/dL — AB (ref 8.9–10.3)
CHLORIDE: 99 mmol/L (ref 98–111)
CO2: 28 mmol/L (ref 22–32)
CREATININE: 5.46 mg/dL — AB (ref 0.61–1.24)
GFR, EST AFRICAN AMERICAN: 13 mL/min — AB (ref >60)
GFR, EST NON AFRICAN AMERICAN: 11 mL/min — AB (ref >60)
Glucose, Bld: 101 mg/dL — ABNORMAL HIGH (ref 70–99)
Phosphorus: 4.5 mg/dL (ref 2.5–4.6)
Potassium: 3.6 mmol/L (ref 3.5–5.1)
Sodium: 137 mmol/L (ref 135–145)

## 2018-05-31 LAB — LACTATE DEHYDROGENASE: LDH: 158 U/L (ref 98–192)

## 2018-05-31 LAB — HEPARIN LEVEL (UNFRACTIONATED): HEPARIN UNFRACTIONATED: 0.3 [IU]/mL (ref 0.30–0.70)

## 2018-05-31 LAB — PROTIME-INR
INR: 1.22
Prothrombin Time: 15.3 seconds — ABNORMAL HIGH (ref 11.4–15.2)

## 2018-05-31 MED ORDER — CHLORHEXIDINE GLUCONATE CLOTH 2 % EX PADS
6.0000 | MEDICATED_PAD | Freq: Every day | CUTANEOUS | Status: DC
  Administered 2018-06-01 – 2018-06-02 (×2): 6 via TOPICAL

## 2018-05-31 MED ORDER — WARFARIN SODIUM 7.5 MG PO TABS
7.5000 mg | ORAL_TABLET | Freq: Once | ORAL | Status: AC
  Administered 2018-05-31: 7.5 mg via ORAL
  Filled 2018-05-31: qty 1

## 2018-05-31 NOTE — Progress Notes (Signed)
Patient ID: [name], male   DOB: 1966/03/25, 52 y.o.   MRN: [id number]   Advanced Heart Failure VAD Team Note  PCP-Cardiologist: No primary care provider on file.   Subjective:    RUE AVG thrombosed, unable to restore flow.   S/P LUE AVG placement.   Denies SOB. Denies nausea. No significant surgical site pain.   INR 1.22 today, on heparin gtt.   LVAD INTERROGATION:  HeartMate 3 LVAD:   Flow 4.0 liters/min, speed 5500, power 4.4, PI 3.9.  No PI events.      Objective:    Vital Signs:   Temp:  [98.2 F (36.8 C)-99 F (37.2 C)] 98.4 F (36.9 C) (09/26 0742) Pulse Rate:  [40-97] 85 (09/26 0742) Resp:  [19-27] 23 (09/26 0742) BP: (89-138)/(55-88) 97/71 (09/26 0742) SpO2:  [93 %-99 %] 99 % (09/26 0742) Weight:  [80.2 kg] 80.2 kg (09/26 0439) Last BM Date: 05/29/18 Mean arterial Pressure 80s-90s   Intake/Output:   Intake/Output Summary (Last 24 hours) at 05/31/2018 0850 Last data filed at 05/31/2018 0800 Gross per 24 hour  Intake 745.54 ml  Output 3000 ml  Net -2254.46 ml     Physical Exam   Physical Exam: General: Well appearing this am. NAD.  HEENT: Normal. Neck: Supple, JVP 7-8 cm. Carotids OK.  Cardiac:  Mechanical heart sounds with LVAD hum present.  Lungs:  CTAB, normal effort.  Abdomen:  NT, ND, no HSM. No bruits or masses. +BS  LVAD exit site: Well-healed and incorporated. Dressing dry and intact. No erythema or drainage. Stabilization device present and accurately applied. Driveline dressing changed daily per sterile technique. Extremities:  Warm and dry. No cyanosis, clubbing, rash, or edema.  Can palpate flow at LUE AVF.  Neuro:  Alert & oriented x 3. Cranial nerves grossly intact. Moves all 4 extremities w/o difficulty. Affect pleasant   Telemetry   NSR 80s personally reviewed.   EKG    No new tracings.   Labs   Basic Metabolic Panel: Recent Labs  Lab 05/27/18 0400 05/28/18 0500 05/29/18 0410 05/30/18 0556 05/31/18 0530  NA 137  136 134* 135 137  K 3.7 3.9 4.3 4.4 3.6  CL 100 100 98 102 99  CO2 25 24 26 25 28   GLUCOSE 112* 69* 149* 116* 101*  BUN 35* 45* 20 30* 19  CREATININE 7.18* 8.89* 4.90* 7.01* 5.46*  CALCIUM 8.2* 8.2* 8.2* 8.3* 8.5*  PHOS  --   --  3.3 3.7 4.5    Liver Function Tests: Recent Labs  Lab 05/29/18 0410 05/30/18 0556 05/31/18 0530  ALBUMIN 2.7* 2.6* 2.7*   No results for input(s): LIPASE, AMYLASE in the last 168 hours. No results for input(s): AMMONIA in the last 168 hours.  CBC: Recent Labs  Lab 05/27/18 0400 05/28/18 0500 05/29/18 0410 05/30/18 0555 05/31/18 0530  WBC 9.4 10.4 9.8 10.4 9.8  HGB 7.5* 7.0* 10.2* 9.5* 10.3*  HCT 24.4* 22.5* 32.6* 30.4* 32.5*  MCV 90.7 90.0 88.6 89.1 89.8  PLT 163 170 199 217 234    INR: Recent Labs  Lab 05/27/18 0400 05/28/18 0500 05/29/18 0410 05/30/18 0556 05/31/18 0530  INR 1.31 1.25 1.18 1.14 1.22    Other results:     Imaging   No results found.   Medications:     Scheduled Medications: . sodium chloride   Intravenous Once  . aspirin EC  81 mg Oral Daily  . atorvastatin  40 mg Oral q1800  . calcitRIOL  0.5  mcg Oral Q M,W,F  . Chlorhexidine Gluconate Cloth  6 each Topical Q0600  . Chlorhexidine Gluconate Cloth  6 each Topical Q0600  . citalopram  20 mg Oral Daily  . darbepoetin (ARANESP) injection - DIALYSIS  40 mcg Intravenous Q Fri-HD  . dronabinol  2.5 mg Oral BID AC  . erythromycin  250 mg Oral TID  . feeding supplement (NEPRO CARB STEADY)  237 mL Oral BID BM  . Influenza vac split quadrivalent PF  0.5 mL Intramuscular Tomorrow-1000  . insulin aspart  5 Units Subcutaneous TID WC  . insulin glargine  10 Units Subcutaneous Daily  . isosorbide-hydrALAZINE  1.5 tablet Oral 3 times per day on Sun Tue Thu Sat  . metoCLOPramide  10 mg Oral TID  . pantoprazole  40 mg Oral BID  . Warfarin - Pharmacist Dosing Inpatient   Does not apply q1800    Infusions: . heparin 1,350 Units/hr (05/31/18 0600)    PRN  Medications: acetaminophen, docusate sodium, LORazepam, LORazepam, ondansetron (ZOFRAN) IV, oxyCODONE, promethazine, promethazine   Patient Profile   [name] a 52 y.o.malewith history of CAD s/p anterior MI in 2010 and NSTEMI in 3/18 with DES to pRCA and mLAD and ischemic cardiomyopathy now s/p Heartmate 3 LVAD and ESRD He was admitted in 10/18 and Heartmate 3 LVAD was placed. He had baseline CKD stage 3 likely from diabetic nephropathy and CHF. He developed peri-op AKI and ended up on dialysis. No renal recovery to date.  Admitted with malfunctioning AVF. Nephrology recommended admit.   Assessment/Plan:    1. ESRD with clotted AV graft: Unable to unclot graft in cath lab.  S/P AV fistula LUE 9/24. Plan for 2nd stage 6 weeks. He has a palpable thrill at AVF site. Currently dialyzing through tunneled HD cath. Nephrology following.   - HD M-W-F 2. 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 Volume managed per dialysis. LDH 168.  - Continue Bidil on non dialysis days, MAP stable.  - Continue asa 81 mg daily.  - On heparin and coumadin restarted last night.  Can stop heparin gtt and go home when INR > 1.8.  3. HTN: Stable. Continue Bidil on non-HD days.  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 - Coumadin restarted and on heparin.  5. Diabetic gastroparesis:Nausea resolved.   - Continue reglan 5 mg tid/ac and Marinol.  - Continue Protonix bid.  - Continue eythromycin indefinitely.  - can use Ativan as needed.   6. LD:JTTSVXBL home regimen. No change. 7. TJQ:ZESPQZRA bilateral disease on ABIs in 8/18. Denies claudication currently, no pedal ulcers. No change. 8. Atrial flutter: Paroxysmal.Remains in NSR.  9. CAD: No chest pain. - Continue statin and asa.  10. Difficult IV access: Would eventually like a port placed.  11. Anemia: In setting of bleeding from catheter site, now resolved.  No overt GI bleeding.  9/23 received 2UPRBCs.   Hgb now stable.   12. Fever: Afebrile.  Blood CX NGTD.  We stopped Zosyn.  He can get vancomyin at his next HD then likely stop.   Home when INR therapeutic.   I reviewed the LVAD parameters from today, and compared the results to the patient's prior recorded data.  No programming changes were made.  The LVAD is functioning within specified parameters.  The patient performs LVAD self-test daily.  LVAD interrogation was negative for any significant power changes, alarms or PI events/speed drops.  LVAD equipment check completed and is in good working  order.  Back-up equipment present.   LVAD education done on emergency procedures and precautions and reviewed exit site care.  Length of Stay: 8  Loralie Champagne, MD 05/31/2018, 8:50 AM  VAD Team --- VAD ISSUES ONLY--- Pager [phone] (7am - 7am)  Advanced Heart Failure Team  Pager [phone] (M-F; 7a - 4p)  Please contact [facility] for night-coverage after hours (4p -7a ) and weekends on [email or web address]

## 2018-05-31 NOTE — Progress Notes (Signed)
LVAD Coordinator Rounding Note:  Direct admitted 05/23/18 from dialysis center. Dialysis center unable to access RUA graft for dialysis treatment.  HM III LVAD implanted on 06/12/17 by Dr. Prescott Gum under Destination Therapy criteria due to renal insufficiency. Pump exchange on 03/01/18 for outflow graft twist/obstruction.   Pt lying in bed, denies complaints. Reports he had dialysis yesterday and 3 liters were removed with treatment. He reports no nausea and is trying to eat today.   Vital signs: Temp: 98.4 HR:  8079 Doppler Pressure:  86 Auto BP:  97/71 (80) O2 Sat: 100% on RA Wt: 79.9>79.5kg  LVAD interrogation reveals:  Speed: 5500 Flow: 4.0 Power: 4.4w PI: 4.0 Alarms: none  Events:  none Hematocrit: 23 Fixed speed: 5500 Low speed limit: 5200  Drive Line:  Pt is currently doing every 3 days dressing changes using the daily kit with silver strip. Dressing is CDI w/anchor secure. Continue twice a week dressing changes. Next dressing change will be due 05/31/18. Bedside nurse may change dressing.  Labs: LDH trend: 191>195>184>191>166>152>158  INR trend: 2.24>1.84>1.47>1.31>1.18>1.14>1.22  Blood Products: - 05/28/18  2 units PCs     Anticoagulation Plan: - INR Goal: 2.0 - 3.0 - ASA Dose: 81 mg daily  - Heparin gtt until INR therapeutic - per pharmacy  Device: -St Jude dual  -Therapies: on 200 bpm - Monitor: on VT 150 bpm  Adverse Events on VAD: - ESRD post LVAD; started CVVH 06/15/17; HD started 06/20/17 - Hospitalization 11/5 - 07/16/17 > gastroparesis diagnosis after EGD - Hospitalization 11/26 - 08/04/17 > gastroparesis - Hospitalization 1/8 - 09/16/17>gastroparesis - referred to Dr. Corliss Parish at [facility] - 10/31/17>rightupper arm arteriovenous graft with Artegraft; ligation of right first stage brachial vein transposition  - 12/11/13 elective admission for thrombectomy for AV graft in RUA  - 12/20/17 > intractable N/V - 01/19/18 > intractable N/V sent by EMS from dialysis  center - 03/01/18 > outflow graft twist/occulsion requiring pump exchange - 05/23/18 > admitted for non-working fistula in RUA   Plan/Recommendations:  1. Please page VAD coordinator with any equipment questions or concerns. 2. Bedside nurse may change VAD dressing using daily kit with silver strip. Next change due 05/31/18.   Zada Girt RN, VAD Coordinator 24/7 VAD Pager: [phone]

## 2018-05-31 NOTE — Progress Notes (Signed)
[facility] NEPHROLOGY PROGRESS NOTE  Assessment/ Plan: Pt is a 52 y.o. yo male admitted with clotted right arm AV graft, underwent OR on 9/19,clotted AVG unable to salvage due to severely atretic draining veins. MWF [facility] 4h 37mn 78.5kg (raising edw to ~80 kg here) 3k/2Ca bath RUA AVG Hep none - calc 0.5ug tiw - no ESA  #Clotted right arm AVG:  -s/p left arm first stage basilic vein transposition fistula by Dr. CDonzetta Matterson 05/29/2018. Plan for second stage procedure in 6 weeks with dialysis access duplex.    # ESRD: HD MWF.  Had HD yesterday with 3 L off. Plan for dialysis tomorrow.  EDW to 80 kg.    # CHF/LVAD; on coumadin and heparin bridge.  # Anemia: Hemoglobin 10.3, received 2 units of PRBC on 9/23.  No further bleeding from catheter site noticed.  Continue Aranesp.  # Secondary hyperparathyroidism: Calcium, phos acceptable. On calcitriol.  # HTN/volume: Blood pressure acceptable.    Subjective: Seen and examined at bedside.   Had dialysis yesterday.  Denies chest pain, shortness of breath, nausea vomiting.  Objective Vital signs in last 24 hours: Vitals:   05/30/18 2320 05/31/18 0330 05/31/18 0439 05/31/18 0742  BP: 90/66 109/85  97/71  Pulse: 89 86  85  Resp: 19 20  (!) 23  Temp: 99 F (37.2 C) 98.2 F (36.8 C)  98.4 F (36.9 C)  TempSrc: Oral Oral  Axillary  SpO2: 97% 97%  99%  Weight:   80.2 kg   Height:       Weight change: 0 kg  Intake/Output Summary (Last 24 hours) at 05/31/2018 0908 Last data filed at 05/31/2018 0800 Gross per 24 hour  Intake 684.69 ml  Output 3000 ml  Net -2315.31 ml       Labs: Basic Metabolic Panel: Recent Labs  Lab 05/29/18 0410 05/30/18 0556 05/31/18 0530  NA 134* 135 137  K 4.3 4.4 3.6  CL 98 102 99  CO2 26 25 28   GLUCOSE 149* 116* 101*  BUN 20 30* 19  CREATININE 4.90* 7.01* 5.46*  CALCIUM 8.2* 8.3* 8.5*  PHOS 3.3 3.7 4.5   Liver Function Tests: Recent Labs  Lab 05/29/18 0410 05/30/18 0556  05/31/18 0530  ALBUMIN 2.7* 2.6* 2.7*   No results for input(s): LIPASE, AMYLASE in the last 168 hours. No results for input(s): AMMONIA in the last 168 hours. CBC: Recent Labs  Lab 05/27/18 0400 05/28/18 0500 05/29/18 0410 05/30/18 0555 05/31/18 0530  WBC 9.4 10.4 9.8 10.4 9.8  HGB 7.5* 7.0* 10.2* 9.5* 10.3*  HCT 24.4* 22.5* 32.6* 30.4* 32.5*  MCV 90.7 90.0 88.6 89.1 89.8  PLT 163 170 199 217 234   Cardiac Enzymes: No results for input(s): CKTOTAL, CKMB, CKMBINDEX, TROPONINI in the last 168 hours. CBG: Recent Labs  Lab 05/30/18 0745 05/30/18 1211 05/30/18 1705 05/30/18 2209 05/31/18 0746  GLUCAP 119* 140* 223* 161* 94    Iron Studies: No results for input(s): IRON, TIBC, TRANSFERRIN, FERRITIN in the last 72 hours. Studies/Results: No results found.  Medications: Infusions: . heparin 1,350 Units/hr (05/31/18 0600)    Scheduled Medications: . sodium chloride   Intravenous Once  . aspirin EC  81 mg Oral Daily  . atorvastatin  40 mg Oral q1800  . calcitRIOL  0.5 mcg Oral Q M,W,F  . Chlorhexidine Gluconate Cloth  6 each Topical Q0600  . Chlorhexidine Gluconate Cloth  6 each Topical Q0600  . citalopram  20 mg Oral Daily  .  darbepoetin (ARANESP) injection - DIALYSIS  40 mcg Intravenous Q Fri-HD  . dronabinol  2.5 mg Oral BID AC  . erythromycin  250 mg Oral TID  . feeding supplement (NEPRO CARB STEADY)  237 mL Oral BID BM  . Influenza vac split quadrivalent PF  0.5 mL Intramuscular Tomorrow-1000  . insulin aspart  5 Units Subcutaneous TID WC  . insulin glargine  10 Units Subcutaneous Daily  . isosorbide-hydrALAZINE  1.5 tablet Oral 3 times per day on Sun Tue Thu Sat  . metoCLOPramide  10 mg Oral TID  . pantoprazole  40 mg Oral BID  . Warfarin - Pharmacist Dosing Inpatient   Does not apply q1800    have reviewed scheduled and prn medications.  Physical Exam: General: Not in distress, comfortable Heart:mechanical sound,  Lungs: clear bilateral, no  crackle Abdomen:soft, Non-tender, non-distended Extremities:No edema Dialysis Access: Left IJ tunnel catheter site with no bleeding.  Right upper extremity AV graft no thrill or bruit.  Emmylou Bieker Prasad Yitta Gongaware 05/31/2018,9:08 AM  LOS: 8 days

## 2018-05-31 NOTE — Progress Notes (Signed)
VAD dressing change:  Existing VAD dressing removed and site care performed using sterile technique. Drive line exit site cleaned with Chlora prep applicators x 2, allowed to dry, and Sorbaview dressing with bio patch re-applied. Exit site healed and incorporated, the velour is fully implanted at exit site. No redness, tenderness, drainage, foul odor or rash noted. Drive line anchor re-applied. Pt may advance to weekly dressings. Next dressing change is due next Thursday 10/3.  [name] RN, BSN VAD Coordinator 24/7 Pager [phone]

## 2018-05-31 NOTE — Progress Notes (Signed)
ANTICOAGULATION CONSULT NOTE  Pharmacy Consult:  Heparin/Warfarin Indication:  LVAD  Allergies  Allergen Reactions  . Metformin And Related Diarrhea    Patient Measurements: Height: 5' 8"  (172.7 cm) Weight: 176 lb 12.9 oz (80.2 kg) IBW/kg (Calculated) : 68.4 Heparin Dosing Weight: 80 kg  Vital Signs: Temp: 98.4 F (36.9 C) (09/26 0742) Temp Source: Axillary (09/26 0742) BP: 97/71 (09/26 0742) Pulse Rate: 85 (09/26 0742)  Labs: Recent Labs    05/29/18 0410 05/30/18 0555 05/30/18 0556 05/31/18 0530  HGB 10.2* 9.5*  --  10.3*  HCT 32.6* 30.4*  --  32.5*  PLT 199 217  --  234  LABPROT 14.9  --  14.5 15.3*  INR 1.18  --  1.14 1.22  HEPARINUNFRC 0.25*  --  0.28* 0.30  CREATININE 4.90*  --  7.01* 5.46*    Estimated Creatinine Clearance: 15.1 mL/min (A) (by C-G formula based on SCr of 5.46 mg/dL (H)).  Assessment: 59 yoM with PMH HF s/p HM3 and Factor 5 Leiden on warfarin PTA. Warfarin had been on hold for HD access and pt on IV heparin bridge but has now been restarted. Heparin level 0.30 this morning, INR subtherapeutic as expected at 1.22 but rising slightly - will boost again.   *PTA Warfarin Dose = 75m daily  Goal of Therapy:  Heparin level 0.3-0.7 units/ml  INR 2-3 Monitor platelets by anticoagulation protocol: Yes   Plan: -Increase heparin to 1400 units/hr -Warfarin 7.51mPO x1 tonight -Daily INR, heparin level, CBC, LDH  MiArrie SenatePharmD, BCPS Clinical Pharmacist [phone] check AMION for all [facility] 05/31/2018

## 2018-06-01 ENCOUNTER — Ambulatory Visit (HOSPITAL_COMMUNITY): Payer: Self-pay

## 2018-06-01 LAB — RENAL FUNCTION PANEL
ANION GAP: 8 (ref 5–15)
Albumin: 2.6 g/dL — ABNORMAL LOW (ref 3.5–5.0)
BUN: 28 mg/dL — ABNORMAL HIGH (ref 6–20)
CALCIUM: 8.5 mg/dL — AB (ref 8.9–10.3)
CHLORIDE: 101 mmol/L (ref 98–111)
CO2: 29 mmol/L (ref 22–32)
Creatinine, Ser: 7.45 mg/dL — ABNORMAL HIGH (ref 0.61–1.24)
GFR calc non Af Amer: 7 mL/min — ABNORMAL LOW (ref >60)
GFR, EST AFRICAN AMERICAN: 9 mL/min — AB (ref >60)
GLUCOSE: 150 mg/dL — AB (ref 70–99)
POTASSIUM: 3.6 mmol/L (ref 3.5–5.1)
Phosphorus: 4.6 mg/dL (ref 2.5–4.6)
SODIUM: 138 mmol/L (ref 135–145)

## 2018-06-01 LAB — HEPARIN LEVEL (UNFRACTIONATED): Heparin Unfractionated: 0.45 IU/mL (ref 0.30–0.70)

## 2018-06-01 LAB — PROTIME-INR
INR: 1.43
Prothrombin Time: 17.3 seconds — ABNORMAL HIGH (ref 11.4–15.2)

## 2018-06-01 LAB — CBC
HEMATOCRIT: 30.4 % — AB (ref 39.0–52.0)
HEMOGLOBIN: 9.4 g/dL — AB (ref 13.0–17.0)
MCH: 27.9 pg (ref 26.0–34.0)
MCHC: 30.9 g/dL (ref 30.0–36.0)
MCV: 90.2 fL (ref 78.0–100.0)
Platelets: 248 10*3/uL (ref 150–400)
RBC: 3.37 MIL/uL — ABNORMAL LOW (ref 4.22–5.81)
RDW: 16.4 % — AB (ref 11.5–15.5)
WBC: 10 10*3/uL (ref 4.0–10.5)

## 2018-06-01 LAB — GLUCOSE, CAPILLARY
GLUCOSE-CAPILLARY: 242 mg/dL — AB (ref 70–99)
Glucose-Capillary: 207 mg/dL — ABNORMAL HIGH (ref 70–99)
Glucose-Capillary: 188 mg/dL — ABNORMAL HIGH (ref 70–99)

## 2018-06-01 LAB — LACTATE DEHYDROGENASE: LDH: 150 U/L (ref 98–192)

## 2018-06-01 MED ORDER — PENTAFLUOROPROP-TETRAFLUOROETH EX AERO
1.0000 "application " | INHALATION_SPRAY | CUTANEOUS | Status: DC | PRN
  Filled 2018-06-01: qty 103.5

## 2018-06-01 MED ORDER — HEPARIN SODIUM (PORCINE) 1000 UNIT/ML IJ SOLN
INTRAMUSCULAR | Status: AC
  Filled 2018-06-01: qty 1

## 2018-06-01 MED ORDER — VANCOMYCIN HCL IN DEXTROSE 1-5 GM/200ML-% IV SOLN
1000.0000 mg | INTRAVENOUS | Status: AC
  Administered 2018-06-01: 1000 mg via INTRAVENOUS
  Filled 2018-06-01: qty 200

## 2018-06-01 MED ORDER — LIDOCAINE-PRILOCAINE 2.5-2.5 % EX CREA
1.0000 "application " | TOPICAL_CREAM | CUTANEOUS | Status: DC | PRN
  Filled 2018-06-01: qty 5

## 2018-06-01 MED ORDER — WARFARIN SODIUM 5 MG PO TABS
5.0000 mg | ORAL_TABLET | Freq: Once | ORAL | Status: AC
  Administered 2018-06-01: 5 mg via ORAL
  Filled 2018-06-01: qty 1

## 2018-06-01 MED ORDER — VANCOMYCIN HCL IN DEXTROSE 750-5 MG/150ML-% IV SOLN
750.0000 mg | INTRAVENOUS | Status: DC
  Filled 2018-06-01: qty 150

## 2018-06-01 MED ORDER — SODIUM CHLORIDE 0.9 % IV SOLN: 100.0000 mL | INTRAVENOUS | Status: DC | PRN

## 2018-06-01 MED ORDER — VANCOMYCIN HCL IN DEXTROSE 1-5 GM/200ML-% IV SOLN
INTRAVENOUS | Status: AC
  Administered 2018-06-01: 1000 mg via INTRAVENOUS
  Filled 2018-06-01: qty 200

## 2018-06-01 MED ORDER — LIDOCAINE HCL (PF) 1 % IJ SOLN: 5.0000 mL | INTRAMUSCULAR | Status: DC | PRN

## 2018-06-01 MED ORDER — DARBEPOETIN ALFA 40 MCG/0.4ML IJ SOSY
PREFILLED_SYRINGE | INTRAMUSCULAR | Status: AC
  Filled 2018-06-01: qty 0.4

## 2018-06-01 MED ORDER — ALTEPLASE 2 MG IJ SOLR: 2.0000 mg | Freq: Once | INTRAMUSCULAR | Status: DC | PRN

## 2018-06-01 MED ORDER — HEPARIN SODIUM (PORCINE) 1000 UNIT/ML IJ SOLN
INTRAMUSCULAR | Status: AC
  Filled 2018-06-01: qty 4

## 2018-06-01 MED ORDER — HEPARIN SODIUM (PORCINE) 1000 UNIT/ML DIALYSIS: 1000.0000 [IU] | INTRAMUSCULAR | Status: DC | PRN

## 2018-06-01 NOTE — Progress Notes (Signed)
ANTICOAGULATION CONSULT NOTE  Pharmacy Consult:  Heparin/Warfarin Indication:  LVAD  Allergies  Allergen Reactions  . Metformin And Related Diarrhea    Patient Measurements: Height: 5' 8"  (172.7 cm) Weight: 179 lb 3.7 oz (81.3 kg) IBW/kg (Calculated) : 68.4 Heparin Dosing Weight: 80 kg  Vital Signs: Temp: 98.5 F (36.9 C) (09/27 0347) Temp Source: Oral (09/27 0347) BP: 105/75 (09/27 0347)  Labs: Recent Labs    05/30/18 0555 05/30/18 0556 05/31/18 0530 06/01/18 0350  HGB 9.5*  --  10.3* 9.4*  HCT 30.4*  --  32.5* 30.4*  PLT 217  --  234 248  LABPROT  --  14.5 15.3* 17.3*  INR  --  1.14 1.22 1.43  HEPARINUNFRC  --  0.28* 0.30 0.45  CREATININE  --  7.01* 5.46* 7.45*    Estimated Creatinine Clearance: 11.1 mL/min (A) (by C-G formula based on SCr of 7.45 mg/dL (H)).  Assessment: 29 yoM with PMH HF s/p HM3 and Factor 5 Leiden on warfarin PTA. Warfarin had been on hold for HD access and pt on IV heparin bridge but has now been restarted. Heparin level 0.45 this morning, INR subtherapeutic as expected at 1.43 but trending up now.   *PTA Warfarin Dose = 33m daily  Goal of Therapy:  Heparin level 0.3-0.7 units/ml  INR 2-3 Monitor platelets by anticoagulation protocol: Yes   Plan: -Continue heparin 1400 units/hr -Warfarin 531mPO x1 tonight -Daily INR, heparin level, CBC, LDH  MiArrie SenatePharmD, BCPS Clinical Pharmacist [phone] check AMION for all [facility] 06/01/2018

## 2018-06-01 NOTE — Progress Notes (Signed)
[facility] NEPHROLOGY PROGRESS NOTE  Assessment/ Plan: Pt is a 52 y.o. yo male admitted with clotted right arm AV graft, underwent OR on 9/19,clotted AVG unable to salvage due to severely atretic draining veins. MWF [facility] 4h 67mn 78.5kg (raising edw to ~80 kg here) 3k/2Ca bath RUA AVG Hep none - calc 0.5ug tiw - no ESA  #Clotted right arm AVG:  -s/p left arm first stage basilic vein transposition fistula by Dr. CDonzetta Matterson 05/29/2018. Plan for second stage procedure in 6 weeks with dialysis access duplex.    # ESRD: HD MWF. Plan for HD today with 3K.  EDW to 80 kg.    # CHF/LVAD; on coumadin and heparin bridge.  # Anemia: monitor cbc, s/p 2 units of PRBC on 9/23.  No further bleeding from catheter site noticed.  Continue Aranesp.  # Secondary hyperparathyroidism: Calcium, phos acceptable. On calcitriol.  # HTN/volume: Blood pressure acceptable.    Subjective: Seen and examined at bedside.  Plan for HD today.  Denied chest pain, shortness of breath, nausea vomiting. Objective Vital signs in last 24 hours: Vitals:   05/31/18 2341 06/01/18 0347 06/01/18 0418 06/01/18 0803  BP: 101/72 105/75  117/82  Pulse:    84  Resp:    16  Temp:  98.5 F (36.9 C)  98.5 F (36.9 C)  TempSrc:  Oral  Oral  SpO2:    99%  Weight:   81.3 kg   Height:       Weight change: -0.5 kg  Intake/Output Summary (Last 24 hours) at 06/01/2018 0921 Last data filed at 06/01/2018 0804 Gross per 24 hour  Intake 1345.06 ml  Output -  Net 1345.06 ml       Labs: Basic Metabolic Panel: Recent Labs  Lab 05/30/18 0556 05/31/18 0530 06/01/18 0350  NA 135 137 138  K 4.4 3.6 3.6  CL 102 99 101  CO2 25 28 29   GLUCOSE 116* 101* 150*  BUN 30* 19 28*  CREATININE 7.01* 5.46* 7.45*  CALCIUM 8.3* 8.5* 8.5*  PHOS 3.7 4.5 4.6   Liver Function Tests: Recent Labs  Lab 05/30/18 0556 05/31/18 0530 06/01/18 0350  ALBUMIN 2.6* 2.7* 2.6*   No results for input(s): LIPASE, AMYLASE in the  last 168 hours. No results for input(s): AMMONIA in the last 168 hours. CBC: Recent Labs  Lab 05/28/18 0500 05/29/18 0410 05/30/18 0555 05/31/18 0530 06/01/18 0350  WBC 10.4 9.8 10.4 9.8 10.0  HGB 7.0* 10.2* 9.5* 10.3* 9.4*  HCT 22.5* 32.6* 30.4* 32.5* 30.4*  MCV 90.0 88.6 89.1 89.8 90.2  PLT 170 199 217 234 248   Cardiac Enzymes: No results for input(s): CKTOTAL, CKMB, CKMBINDEX, TROPONINI in the last 168 hours. CBG: Recent Labs  Lab 05/31/18 0746 05/31/18 1210 05/31/18 1717 05/31/18 2155 06/01/18 0858  GLUCAP 94 189* 159* 141* 242*    Iron Studies: No results for input(s): IRON, TIBC, TRANSFERRIN, FERRITIN in the last 72 hours. Studies/Results: No results found.  Medications: Infusions: . heparin 1,400 Units/hr (06/01/18 0347)  . vancomycin      Scheduled Medications: . sodium chloride   Intravenous Once  . aspirin EC  81 mg Oral Daily  . atorvastatin  40 mg Oral q1800  . calcitRIOL  0.5 mcg Oral Q M,W,F  . Chlorhexidine Gluconate Cloth  6 each Topical Q0600  . Chlorhexidine Gluconate Cloth  6 each Topical Q0600  . Chlorhexidine Gluconate Cloth  6 each Topical Q0600  . citalopram  20 mg Oral  Daily  . darbepoetin (ARANESP) injection - DIALYSIS  40 mcg Intravenous Q Fri-HD  . dronabinol  2.5 mg Oral BID AC  . erythromycin  250 mg Oral TID  . feeding supplement (NEPRO CARB STEADY)  237 mL Oral BID BM  . Influenza vac split quadrivalent PF  0.5 mL Intramuscular Tomorrow-1000  . insulin aspart  5 Units Subcutaneous TID WC  . insulin glargine  10 Units Subcutaneous Daily  . isosorbide-hydrALAZINE  1.5 tablet Oral 3 times per day on Sun Tue Thu Sat  . metoCLOPramide  10 mg Oral TID  . pantoprazole  40 mg Oral BID  . warfarin  5 mg Oral ONCE-1800  . Warfarin - Pharmacist Dosing Inpatient   Does not apply q1800    have reviewed scheduled and prn medications.  Physical Exam: General: Not in distress, comfortable Heart:mechanical sound,  Lungs: Clear  bilateral, no crackles Abdomen:soft, Non-tender, non-distended Extremities:No edema Dialysis Access: Left IJ tunnel catheter site with no bleeding.  Right upper extremity AV graft no thrill or bruit.  Mekia Dipinto Prasad Kerin Kren 06/01/2018,9:21 AM  LOS: 9 days

## 2018-06-01 NOTE — Progress Notes (Signed)
Patient returned to room 2C08 from dialysis via hospital bed.

## 2018-06-01 NOTE — Progress Notes (Signed)
LVAD Coordinator Rounding Note:  Direct admitted 05/23/18 from dialysis center. Dialysis center unable to access RUA graft for dialysis treatment.  HM III LVAD implanted on 06/12/17 by Dr. Prescott Gum under Destination Therapy criteria due to renal insufficiency. Pump exchange on 03/01/18 for outflow graft twist/obstruction.   Pt lying in bed, denies complaints. States that he is ready to go home, but pt informed that we will have to wait until his INR is higher.  Vital signs: Temp: 98.5 HR:  84 Doppler Pressure:  90 Auto BP:  117/82 (94) O2 Sat: 99% on RA Wt: 79.9>79.5>81.3kg  LVAD interrogation reveals:  Speed: 5500 Flow: 4.2 Power: 5.0w PI: 3.4 Alarms: none  Events:  none Hematocrit: 23 Fixed speed: 5500 Low speed limit: 5200  Drive Line:  Weekly dressing changes. Next dressing change is due 06/07/18.  Labs: LDH trend: 191>195>184>191>166>152>158>150  INR trend: 2.24>1.84>1.47>1.31>1.18>1.14>1.22>1.43  Blood Products: - 05/28/18  2 units PCs     Anticoagulation Plan: - INR Goal: 2.0 - 3.0 - ASA Dose: 81 mg daily  - Heparin gtt until INR therapeutic - per pharmacy  Device: -St Jude dual  -Therapies: on 200 bpm - Monitor: on VT 150 bpm  Adverse Events on VAD: - ESRD post LVAD; started CVVH 06/15/17; HD started 06/20/17 - Hospitalization 11/5 - 07/16/17 > gastroparesis diagnosis after EGD - Hospitalization 11/26 - 08/04/17 > gastroparesis - Hospitalization 1/8 - 09/16/17>gastroparesis - referred to Dr. Corliss Parish at [facility] - 10/31/17>rightupper arm arteriovenous graft with Artegraft; ligation of right first stage brachial vein transposition  - 12/11/13 elective admission for thrombectomy for AV graft in RUA  - 12/20/17 > intractable N/V - 01/19/18 > intractable N/V sent by EMS from dialysis center - 03/01/18 > outflow graft twist/occulsion requiring pump exchange - 05/23/18 > admitted for non-working fistula in RUA   Plan/Recommendations:  1. Please page VAD coordinator with  any equipment questions or concerns.   [name] RN, VAD Coordinator 24/7 VAD Pager: [phone]

## 2018-06-01 NOTE — Progress Notes (Addendum)
Patient ID: [name], male   DOB: 05-10-1966, 52 y.o.   MRN: [id number]   Advanced Heart Failure VAD Team Note  PCP-Cardiologist: No primary care provider on file.   Subjective:    RUE AVG thrombosed, unable to restore flow.   S/P LUE AVG placement.   INR 1.4. On heparin drip.   Feeling ok. Denies nausea.   LVAD INTERROGATION:  HeartMate 3 LVAD:   Flow 4.2 liters/min, speed 5500, power 5  PI 3.4 .  No PI events.      Objective:    Vital Signs:   Temp:  [98.3 F (36.8 C)-98.9 F (37.2 C)] 98.5 F (36.9 C) (09/27 0803) Pulse Rate:  [68-84] 84 (09/27 0803) Resp:  [13-32] 16 (09/27 0803) BP: (87-117)/(64-83) 117/82 (09/27 0803) SpO2:  [98 %-99 %] 99 % (09/27 0803) Weight:  [81.3 kg] 81.3 kg (09/27 0418) Last BM Date: 05/29/18 Mean arterial Pressure 70s-80s   Intake/Output:   Intake/Output Summary (Last 24 hours) at 06/01/2018 0833 Last data filed at 06/01/2018 0804 Gross per 24 hour  Intake 1345.06 ml  Output -  Net 1345.06 ml     Physical Exam    Physical Exam: GENERAL: In bed. NAD HEENT: normal  NECK: Supple, JVP 6-7   .  2+ bilaterally, no bruits.  No lymphadenopathy or thyromegaly appreciated.   CARDIAC:  Mechanical heart sounds with LVAD hum present. LIJ HD catheter. RIJ LUNGS:  Clear to auscultation bilaterally.  ABDOMEN:  Soft, round, nontender, positive bowel sounds x4.     LVAD exit site: wDressing dry and intact.  No erythema or drainage.  Stabilization device present and accurately applied.  EXTREMITIES:  Warm and dry, no cyanosis, clubbing, rash or edema . LUE AVF.  NEUROLOGIC:  Alert and oriented x 4.  MAE x4.   Affect pleasant.       Telemetry   NSR 80s personally reviewed.   EKG    No new tracings.   Labs   Basic Metabolic Panel: Recent Labs  Lab 05/28/18 0500 05/29/18 0410 05/30/18 0556 05/31/18 0530 06/01/18 0350  NA 136 134* 135 137 138  K 3.9 4.3 4.4 3.6 3.6  CL 100 98 102 99 101  CO2 24 26 25 28 29   GLUCOSE 69*  149* 116* 101* 150*  BUN 45* 20 30* 19 28*  CREATININE 8.89* 4.90* 7.01* 5.46* 7.45*  CALCIUM 8.2* 8.2* 8.3* 8.5* 8.5*  PHOS  --  3.3 3.7 4.5 4.6    Liver Function Tests: Recent Labs  Lab 05/29/18 0410 05/30/18 0556 05/31/18 0530 06/01/18 0350  ALBUMIN 2.7* 2.6* 2.7* 2.6*   No results for input(s): LIPASE, AMYLASE in the last 168 hours. No results for input(s): AMMONIA in the last 168 hours.  CBC: Recent Labs  Lab 05/28/18 0500 05/29/18 0410 05/30/18 0555 05/31/18 0530 06/01/18 0350  WBC 10.4 9.8 10.4 9.8 10.0  HGB 7.0* 10.2* 9.5* 10.3* 9.4*  HCT 22.5* 32.6* 30.4* 32.5* 30.4*  MCV 90.0 88.6 89.1 89.8 90.2  PLT 170 199 217 234 248    INR: Recent Labs  Lab 05/28/18 0500 05/29/18 0410 05/30/18 0556 05/31/18 0530 06/01/18 0350  INR 1.25 1.18 1.14 1.22 1.43    Other results:     Imaging   No results found.   Medications:     Scheduled Medications: . sodium chloride   Intravenous Once  . aspirin EC  81 mg Oral Daily  . atorvastatin  40 mg Oral q1800  . calcitRIOL  0.5  mcg Oral Q M,W,F  . Chlorhexidine Gluconate Cloth  6 each Topical Q0600  . Chlorhexidine Gluconate Cloth  6 each Topical Q0600  . Chlorhexidine Gluconate Cloth  6 each Topical Q0600  . citalopram  20 mg Oral Daily  . darbepoetin (ARANESP) injection - DIALYSIS  40 mcg Intravenous Q Fri-HD  . dronabinol  2.5 mg Oral BID AC  . erythromycin  250 mg Oral TID  . feeding supplement (NEPRO CARB STEADY)  237 mL Oral BID BM  . Influenza vac split quadrivalent PF  0.5 mL Intramuscular Tomorrow-1000  . insulin aspart  5 Units Subcutaneous TID WC  . insulin glargine  10 Units Subcutaneous Daily  . isosorbide-hydrALAZINE  1.5 tablet Oral 3 times per day on Sun Tue Thu Sat  . metoCLOPramide  10 mg Oral TID  . pantoprazole  40 mg Oral BID  . warfarin  5 mg Oral ONCE-1800  . Warfarin - Pharmacist Dosing Inpatient   Does not apply q1800    Infusions: . heparin 1,400 Units/hr (06/01/18 0347)     PRN Medications: acetaminophen, docusate sodium, LORazepam, LORazepam, ondansetron (ZOFRAN) IV, promethazine, promethazine   Patient Profile   [name] a 52 y.o.malewith history of CAD s/p anterior MI in 2010 and NSTEMI in 3/18 with DES to pRCA and mLAD and ischemic cardiomyopathy now s/p Heartmate 3 LVAD and ESRD He was admitted in 10/18 and Heartmate 3 LVAD was placed. He had baseline CKD stage 3 likely from diabetic nephropathy and CHF. He developed peri-op AKI and ended up on dialysis. No renal recovery to date.  Admitted with malfunctioning AVF. Nephrology recommended admit.   Assessment/Plan:    1. ESRD with clotted AV graft: Unable to unclot graft in cath lab.  S/P AV fistula LUE 9/24. Plan for 2nd stage 6 weeks. He has a palpable thrill at AVF site. Currently dialyzing through tunneled HD cath. Nephrology following.   - HD M-W-F 2. 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 Volume managed per dialysis. LDH 168.  - Continue Bidil on non dialysis days, Maps stable.  - Continue asa 81 mg daily.  - On heparin and coumadin restarted last night.  Can stop heparin gtt and go home when INR > 1.8.  3. HTN: Stable. Continue Bidil on non-HD days.  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 - Coumadin restarted and on heparin.  5. Diabetic gastroparesis:Nausea resolved.   - Continue reglan 5 mg tid/ac and Marinol.  - Continue Protonix bid.  - Continue eythromycin indefinitely.  - can use Ativan as needed.   6. IE:PPIRJJOA home regimen. No change. 7. CZY:SAYTKZSW bilateral disease on ABIs in 8/18. Denies claudication currently, no pedal ulcers. No change. 8. Atrial flutter: Paroxysmal. Remains in NSR.  9. CAD: No chest pain. - Continue statin and asa.  10. Difficult IV access: Would eventually like a port placed.  11. Anemia: In setting of bleeding from catheter site, now resolved.  No overt GI bleeding.  9/23 received 2UPRBCs.   Hgb 9.4  12. Fever: Afebrile.  Blood CX NGTD.  We stopped Zosyn.  He can get vancomyin at his next HD then likely stop.   INR 1.4 . Home when INR therapeutic.   I reviewed the LVAD parameters from today, and compared the results to the patient's prior recorded data.  No programming changes were made.  The LVAD is functioning within specified parameters.  The patient performs LVAD self-test daily.  LVAD interrogation was negative for  any significant power changes, alarms or PI events/speed drops.  LVAD equipment check completed and is in good working order.  Back-up equipment present.   LVAD education done on emergency procedures and precautions and reviewed exit site care.  Length of Stay: [place], NP 06/01/2018, 8:33 AM  VAD Team --- VAD ISSUES ONLY--- Pager [phone] (7am - 7am)  Advanced Heart Failure Team  Pager [phone] (M-F; 7a - 4p)  Please contact [facility] for night-coverage after hours (4p -7a ) and weekends on [email or web address]  Patient seen with NP, agree with the above note.    He is stable this morning.  Going for HD today.  INR is 1.4.  Need to get INR to 1.8 to stop heparin and go home.  MAP controlled.    Will give vancomycin with HD today then stop.   Phyllis Whitefield 06/01/2018 8:45 AM

## 2018-06-02 LAB — CBC
HCT: 32.4 % — ABNORMAL LOW (ref 39.0–52.0)
Hemoglobin: 10.1 g/dL — ABNORMAL LOW (ref 13.0–17.0)
MCH: 28.1 pg (ref 26.0–34.0)
MCHC: 31.2 g/dL (ref 30.0–36.0)
MCV: 90.3 fL (ref 78.0–100.0)
PLATELETS: 254 10*3/uL (ref 150–400)
RBC: 3.59 MIL/uL — ABNORMAL LOW (ref 4.22–5.81)
RDW: 16.7 % — ABNORMAL HIGH (ref 11.5–15.5)
WBC: 11.2 10*3/uL — AB (ref 4.0–10.5)

## 2018-06-02 LAB — RENAL FUNCTION PANEL
ALBUMIN: 2.6 g/dL — AB (ref 3.5–5.0)
Anion gap: 11 (ref 5–15)
BUN: 17 mg/dL (ref 6–20)
CHLORIDE: 99 mmol/L (ref 98–111)
CO2: 27 mmol/L (ref 22–32)
CREATININE: 4.67 mg/dL — AB (ref 0.61–1.24)
Calcium: 8.7 mg/dL — ABNORMAL LOW (ref 8.9–10.3)
GFR calc Af Amer: 15 mL/min — ABNORMAL LOW (ref >60)
GFR, EST NON AFRICAN AMERICAN: 13 mL/min — AB (ref >60)
Glucose, Bld: 129 mg/dL — ABNORMAL HIGH (ref 70–99)
Phosphorus: 3.1 mg/dL (ref 2.5–4.6)
Potassium: 3.4 mmol/L — ABNORMAL LOW (ref 3.5–5.1)
Sodium: 137 mmol/L (ref 135–145)

## 2018-06-02 LAB — GLUCOSE, CAPILLARY
Glucose-Capillary: 149 mg/dL — ABNORMAL HIGH (ref 70–99)
Glucose-Capillary: 112 mg/dL — ABNORMAL HIGH (ref 70–99)
Glucose-Capillary: 152 mg/dL — ABNORMAL HIGH (ref 70–99)
Glucose-Capillary: 139 mg/dL — ABNORMAL HIGH (ref 70–99)

## 2018-06-02 LAB — CULTURE, BLOOD (ROUTINE X 2)
CULTURE: NO GROWTH
CULTURE: NO GROWTH
Special Requests: ADEQUATE
Special Requests: ADEQUATE

## 2018-06-02 LAB — LACTATE DEHYDROGENASE: LDH: 164 U/L (ref 98–192)

## 2018-06-02 LAB — PROTIME-INR
INR: 1.46
Prothrombin Time: 17.6 seconds — ABNORMAL HIGH (ref 11.4–15.2)

## 2018-06-02 LAB — HEPARIN LEVEL (UNFRACTIONATED): Heparin Unfractionated: 0.42 IU/mL (ref 0.30–0.70)

## 2018-06-02 MED ORDER — WARFARIN SODIUM 7.5 MG PO TABS
7.5000 mg | ORAL_TABLET | Freq: Once | ORAL | Status: AC
  Administered 2018-06-02: 7.5 mg via ORAL
  Filled 2018-06-02: qty 1

## 2018-06-02 MED ORDER — POTASSIUM CHLORIDE CRYS ER 20 MEQ PO TBCR
20.0000 meq | EXTENDED_RELEASE_TABLET | Freq: Once | ORAL | Status: AC
  Administered 2018-06-02: 20 meq via ORAL
  Filled 2018-06-02: qty 1

## 2018-06-02 NOTE — Progress Notes (Signed)
[facility] NEPHROLOGY PROGRESS NOTE  Assessment/ Plan: Pt is a 52 y.o. yo male admitted with clotted right arm AV graft, underwent OR on 9/19,clotted AVG unable to salvage due to severely atretic draining veins. MWF [facility] 4h 20mn 78.5kg (raising edw to ~80 kg here) 3k/2Ca bath RUA AVG Hep none - calc 0.5ug tiw - no ESA  #Clotted right arm AVG:  -s/p left arm first stage basilic vein transposition fistula by Dr. CDonzetta Matterson 05/29/2018. Plan for second stage procedure in 6 weeks with dialysis access duplex.    # ESRD: HD MWF.  Had dialysis yesterday with 2 L ultrafiltration.  Volume status acceptable.  Next dialysis on Monday.  EDW to 80 kg.    # CHF/LVAD; on coumadin and heparin bridge.  # Anemia: monitor cbc, s/p 2 units of PRBC on 9/23.  No further bleeding from catheter site noticed.  Continue Aranesp.  # Secondary hyperparathyroidism: Calcium, phos acceptable. On calcitriol.  # HTN/volume: Blood pressure acceptable.    Subjective: Seen and examined at bedside.  Doing well.  Denied headache, dizziness, nausea vomiting chest pain shortness of breath. Objective Vital signs in last 24 hours: Vitals:   06/02/18 0018 06/02/18 0407 06/02/18 0410 06/02/18 0807  BP: 93/82  118/86 107/82  Pulse: 80  81 83  Resp: 19  19 (!) 21  Temp: 99.3 F (37.4 C) 98.6 F (37 C)  99 F (37.2 C)  TempSrc: Oral Oral  Oral  SpO2: 100%  100% 99%  Weight:   80.7 kg   Height:       Weight change: 1.2 kg  Intake/Output Summary (Last 24 hours) at 06/02/2018 0928 Last data filed at 06/01/2018 2000 Gross per 24 hour  Intake 800.48 ml  Output 2000 ml  Net -1199.52 ml       Labs: Basic Metabolic Panel: Recent Labs  Lab 05/31/18 0530 06/01/18 0350 06/02/18 0500  NA 137 138 137  K 3.6 3.6 3.4*  CL 99 101 99  CO2 28 29 27   GLUCOSE 101* 150* 129*  BUN 19 28* 17  CREATININE 5.46* 7.45* 4.67*  CALCIUM 8.5* 8.5* 8.7*  PHOS 4.5 4.6 3.1   Liver Function Tests: Recent Labs   Lab 05/31/18 0530 06/01/18 0350 06/02/18 0500  ALBUMIN 2.7* 2.6* 2.6*   No results for input(s): LIPASE, AMYLASE in the last 168 hours. No results for input(s): AMMONIA in the last 168 hours. CBC: Recent Labs  Lab 05/29/18 0410 05/30/18 0555 05/31/18 0530 06/01/18 0350 06/02/18 0500  WBC 9.8 10.4 9.8 10.0 11.2*  HGB 10.2* 9.5* 10.3* 9.4* 10.1*  HCT 32.6* 30.4* 32.5* 30.4* 32.4*  MCV 88.6 89.1 89.8 90.2 90.3  PLT 199 217 234 248 254   Cardiac Enzymes: No results for input(s): CKTOTAL, CKMB, CKMBINDEX, TROPONINI in the last 168 hours. CBG: Recent Labs  Lab 05/31/18 2155 06/01/18 0858 06/01/18 1752 06/01/18 2132 06/02/18 0810  GLUCAP 141* 242* 207* 188* 149*    Iron Studies: No results for input(s): IRON, TIBC, TRANSFERRIN, FERRITIN in the last 72 hours. Studies/Results: No results found.  Medications: Infusions: . heparin 1,400 Units/hr (06/01/18 2000)    Scheduled Medications: . sodium chloride   Intravenous Once  . aspirin EC  81 mg Oral Daily  . atorvastatin  40 mg Oral q1800  . calcitRIOL  0.5 mcg Oral Q M,W,F  . Chlorhexidine Gluconate Cloth  6 each Topical Q0600  . Chlorhexidine Gluconate Cloth  6 each Topical Q0600  . Chlorhexidine Gluconate Cloth  6 each Topical Q0600  . citalopram  20 mg Oral Daily  . darbepoetin (ARANESP) injection - DIALYSIS  40 mcg Intravenous Q Fri-HD  . dronabinol  2.5 mg Oral BID AC  . erythromycin  250 mg Oral TID  . feeding supplement (NEPRO CARB STEADY)  237 mL Oral BID BM  . Influenza vac split quadrivalent PF  0.5 mL Intramuscular Tomorrow-1000  . insulin aspart  5 Units Subcutaneous TID WC  . insulin glargine  10 Units Subcutaneous Daily  . isosorbide-hydrALAZINE  1.5 tablet Oral 3 times per day on Sun Tue Thu Sat  . metoCLOPramide  10 mg Oral TID  . pantoprazole  40 mg Oral BID  . potassium chloride  20 mEq Oral Once  . warfarin  7.5 mg Oral ONCE-1800  . Warfarin - Pharmacist Dosing Inpatient   Does not apply  q1800    have reviewed scheduled and prn medications.  Physical Exam: General: Not in distress, comfortable  Heart:mechanical sound,  Lungs: Clear bilateral, no crackles Abdomen:soft, Non-tender, non-distended Extremities:No edema Dialysis Access: Left IJ tunnel catheter site with no bleeding.  Right upper extremity AV graft no thrill or bruit.  [name] 06/02/2018,9:28 AM  LOS: 10 days

## 2018-06-02 NOTE — Progress Notes (Signed)
ANTICOAGULATION CONSULT NOTE  Pharmacy Consult:  Heparin/Warfarin Indication:  LVAD  Allergies  Allergen Reactions  . Metformin And Related Diarrhea    Patient Measurements: Height: 5' 8"  (172.7 cm) Weight: 177 lb 14.6 oz (80.7 kg)(scale b) IBW/kg (Calculated) : 68.4 Heparin Dosing Weight: 80 kg  Vital Signs: Temp: 99 F (37.2 C) (09/28 0807) Temp Source: Oral (09/28 0807) BP: 107/82 (09/28 0807) Pulse Rate: 83 (09/28 0807)  Labs: Recent Labs    05/31/18 0530 06/01/18 0350 06/02/18 0500  HGB 10.3* 9.4* 10.1*  HCT 32.5* 30.4* 32.4*  PLT 234 248 254  LABPROT 15.3* 17.3* 17.6*  INR 1.22 1.43 1.46  HEPARINUNFRC 0.30 0.45 0.42  CREATININE 5.46* 7.45* 4.67*    Estimated Creatinine Clearance: 17.7 mL/min (A) (by C-G formula based on SCr of 4.67 mg/dL (H)).  Assessment: 33 yoM with PMH HF s/p HM3 and Factor 5 Leiden on warfarin PTA. Warfarin had been on hold for HD access and pt on IV heparin bridge but has now been restarted. .  Heparin level 0.42 this morning, INR subtherapeutic as expected at 1.46 slow trend overnight. LDH/CBC stable.  *PTA Warfarin Dose = 44m daily  Goal of Therapy:  Heparin level 0.3-0.7 units/ml  INR 2-3 Monitor platelets by anticoagulation protocol: Yes   Plan: -Continue heparin 1400 units/hr -Warfarin 7.515mPO x1 tonight -Daily INR, heparin level, CBC, LDH  FrErin HearingharmD., BCPS Clinical Pharmacist 06/02/2018 8:56 AM  Please check AMION for all [facility] numbers 06/02/2018

## 2018-06-02 NOTE — Progress Notes (Addendum)
Patient ID: [name], male   DOB: 05-18-1966, 52 y.o.   MRN: [id number]   Advanced Heart Failure VAD Team Note  PCP-Cardiologist: No primary care provider on file.   Subjective:    RUE AVG thrombosed, unable to restore flow.   S/P LUE AVG placement.   INR 1.46. On heparin drip.  No bleeding   Feels ok. Denies nause, SOB, orthopnea or PND.   LVAD INTERROGATION:  HeartMate 3 LVAD:   Flow 4.0 liters/min, speed 5500, power 4.0  PI 4.0 .  No PI events.      Objective:    Vital Signs:   Temp:  [98.2 F (36.8 C)-99.3 F (37.4 C)] 99 F (37.2 C) (09/28 0807) Pulse Rate:  [66-88] 83 (09/28 0807) Resp:  [15-29] 21 (09/28 0807) BP: (93-144)/(54-106) 107/82 (09/28 0807) SpO2:  [97 %-100 %] 99 % (09/28 0807) Weight:  [80.6 kg-82.5 kg] 80.7 kg (09/28 0410) Last BM Date: 05/29/18 Mean arterial Pressure 80-90ss   Intake/Output:   Intake/Output Summary (Last 24 hours) at 06/02/2018 0832 Last data filed at 06/01/2018 2000 Gross per 24 hour  Intake 800.48 ml  Output 2000 ml  Net -1199.52 ml     Physical Exam    Physical Exam: General:  NAD.  HEENT: normal  Neck: supple. JVP not elevated.  Carotids 2+ bilat; no bruits. No lymphadenopathy or thryomegaly appreciated. Cor: LVAD hum.  Lungs: Clear. Abdomen: soft, nontender, non-distended. No hepatosplenomegaly. No bruits or masses. Good bowel sounds. Driveline site clean. Anchor in place.  Extremities: no cyanosis, clubbing, rash. Warm no edema  LUE AVF Neuro: alert & oriented x 3. No focal deficits. Moves all 4 without problem     Telemetry   NSR 80-90s personally reviewed.   EKG    No new tracings.   Labs   Basic Metabolic Panel: Recent Labs  Lab 05/29/18 0410 05/30/18 0556 05/31/18 0530 06/01/18 0350 06/02/18 0500  NA 134* 135 137 138 137  K 4.3 4.4 3.6 3.6 3.4*  CL 98 102 99 101 99  CO2 26 25 28 29 27   GLUCOSE 149* 116* 101* 150* 129*  BUN 20 30* 19 28* 17  CREATININE 4.90* 7.01* 5.46* 7.45*  4.67*  CALCIUM 8.2* 8.3* 8.5* 8.5* 8.7*  PHOS 3.3 3.7 4.5 4.6 3.1    Liver Function Tests: Recent Labs  Lab 05/29/18 0410 05/30/18 0556 05/31/18 0530 06/01/18 0350 06/02/18 0500  ALBUMIN 2.7* 2.6* 2.7* 2.6* 2.6*   No results for input(s): LIPASE, AMYLASE in the last 168 hours. No results for input(s): AMMONIA in the last 168 hours.  CBC: Recent Labs  Lab 05/29/18 0410 05/30/18 0555 05/31/18 0530 06/01/18 0350 06/02/18 0500  WBC 9.8 10.4 9.8 10.0 11.2*  HGB 10.2* 9.5* 10.3* 9.4* 10.1*  HCT 32.6* 30.4* 32.5* 30.4* 32.4*  MCV 88.6 89.1 89.8 90.2 90.3  PLT 199 217 234 248 254    INR: Recent Labs  Lab 05/29/18 0410 05/30/18 0556 05/31/18 0530 06/01/18 0350 06/02/18 0500  INR 1.18 1.14 1.22 1.43 1.46    Other results:     Imaging   No results found.   Medications:     Scheduled Medications: . sodium chloride   Intravenous Once  . aspirin EC  81 mg Oral Daily  . atorvastatin  40 mg Oral q1800  . calcitRIOL  0.5 mcg Oral Q M,W,F  . Chlorhexidine Gluconate Cloth  6 each Topical Q0600  . Chlorhexidine Gluconate Cloth  6 each Topical Q0600  . Chlorhexidine  Gluconate Cloth  6 each Topical [id number]  . citalopram  20 mg Oral Daily  . darbepoetin (ARANESP) injection - DIALYSIS  40 mcg Intravenous Q Fri-HD  . dronabinol  2.5 mg Oral BID AC  . erythromycin  250 mg Oral TID  . feeding supplement (NEPRO CARB STEADY)  237 mL Oral BID BM  . Influenza vac split quadrivalent PF  0.5 mL Intramuscular Tomorrow-1000  . insulin aspart  5 Units Subcutaneous TID WC  . insulin glargine  10 Units Subcutaneous Daily  . isosorbide-hydrALAZINE  1.5 tablet Oral 3 times per day on Sun Tue Thu Sat  . metoCLOPramide  10 mg Oral TID  . pantoprazole  40 mg Oral BID  . Warfarin - Pharmacist Dosing Inpatient   Does not apply q1800    Infusions: . 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 He had baseline CKD stage 3 likely from diabetic nephropathy and CHF. He developed peri-op AKI and ended up on dialysis. No renal recovery to date.  Admitted with malfunctioning AVF. Nephrology recommended admit.   Assessment/Plan:    1. ESRD with clotted AV graft: Unable to unclot graft in cath lab.  S/P AV fistula LUE 9/24. Plan for 2nd stage 6 weeks. He has a palpable thrill at AVF site. Currently dialyzing through tunneled HD cath. Nephrology following.   - HD M-W-F. Tolerating well. Volume status ok. 2. 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 Volume managed per dialysis. Volume looks good. LDH 164.  - Continue Bidil on non dialysis days, Maps stable.  - Continue asa 81 mg daily.  - On heparin and coumadin restarted last night.  Can stop heparin gtt and go home when INR > 1.8.  3. HTN: Stable. Continue Bidil on non-HD days.  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 - Coumadin restarted and on heparin. INR 1.46. No bleeding on heparin. Discussed dosing with PharmD personally. 5. Diabetic gastroparesis:Nausea resolved.   - Continue reglan 5 mg tid/ac and Marinol.  - Continue Protonix bid.  - Continue eythromycin indefinitely.  - can use Ativan as needed.   6. ZO:XWRUEAVW home regimen. No change. 7. UJW:JXBJYNWG bilateral disease on ABIs in 8/18. Denies claudication currently, no pedal ulcers. No change. 8. Atrial flutter: Paroxysmal. Remains in NSR.  9. CAD: No chest pain. - Continue statin and asa.  10. Difficult IV access: Would eventually like a port placed.  11. Anemia: In setting of bleeding from catheter site, now resolved.  No overt GI  bleeding.  9/23 received 2UPRBCs.  Hgb 9.4  12. Fever: Afebrile.  Blood CX NGTD.  We stopped Zosyn.  He got last dose vancomyin yesterday with HD and now off.  13. Hypokalemia: supp gently with ESRD  INR 1.46 . On heparin. No bleeding. Hgb stable at 10.1 Home when INR therapeutic.   I reviewed the LVAD parameters from today, and compared the results to the patient's prior recorded data.  No programming changes were made.  The LVAD is functioning within specified parameters.  The patient performs LVAD self-test daily.  LVAD interrogation was negative for any significant power  changes, alarms or PI events/speed drops.  LVAD equipment check completed and is in good working order.  Back-up equipment present.   LVAD education done on emergency procedures and precautions and reviewed exit site care.  Length of Stay: [place], MD 06/02/2018, 8:32 AM  VAD Team --- VAD ISSUES ONLY--- Pager [phone] (7am - 7am)  Advanced Heart Failure Team  Pager [phone] (M-F; 7a - 4p)  Please contact [facility] for night-coverage after hours (4p -7a ) and weekends on [email or web address]

## 2018-06-03 LAB — GLUCOSE, CAPILLARY
GLUCOSE-CAPILLARY: 172 mg/dL — AB (ref 70–99)
GLUCOSE-CAPILLARY: 234 mg/dL — AB (ref 70–99)
Glucose-Capillary: 127 mg/dL — ABNORMAL HIGH (ref 70–99)
Glucose-Capillary: 189 mg/dL — ABNORMAL HIGH (ref 70–99)

## 2018-06-03 LAB — RENAL FUNCTION PANEL
Albumin: 2.7 g/dL — ABNORMAL LOW (ref 3.5–5.0)
Anion gap: 10 (ref 5–15)
BUN: 30 mg/dL — ABNORMAL HIGH (ref 6–20)
CHLORIDE: 100 mmol/L (ref 98–111)
CO2: 26 mmol/L (ref 22–32)
Calcium: 8.6 mg/dL — ABNORMAL LOW (ref 8.9–10.3)
Creatinine, Ser: 7.12 mg/dL — ABNORMAL HIGH (ref 0.61–1.24)
GFR, EST AFRICAN AMERICAN: 9 mL/min — AB (ref >60)
GFR, EST NON AFRICAN AMERICAN: 8 mL/min — AB (ref >60)
Glucose, Bld: 153 mg/dL — ABNORMAL HIGH (ref 70–99)
POTASSIUM: 3.4 mmol/L — AB (ref 3.5–5.1)
Phosphorus: 3.6 mg/dL (ref 2.5–4.6)
Sodium: 136 mmol/L (ref 135–145)

## 2018-06-03 LAB — CBC
HCT: 30.7 % — ABNORMAL LOW (ref 39.0–52.0)
HEMOGLOBIN: 9.5 g/dL — AB (ref 13.0–17.0)
MCH: 28.1 pg (ref 26.0–34.0)
MCHC: 30.9 g/dL (ref 30.0–36.0)
MCV: 90.8 fL (ref 78.0–100.0)
Platelets: 258 10*3/uL (ref 150–400)
RBC: 3.38 MIL/uL — AB (ref 4.22–5.81)
RDW: 16.7 % — ABNORMAL HIGH (ref 11.5–15.5)
WBC: 10.1 10*3/uL (ref 4.0–10.5)

## 2018-06-03 LAB — HEPARIN LEVEL (UNFRACTIONATED): HEPARIN UNFRACTIONATED: 0.4 [IU]/mL (ref 0.30–0.70)

## 2018-06-03 LAB — LACTATE DEHYDROGENASE: LDH: 168 U/L (ref 98–192)

## 2018-06-03 LAB — PROTIME-INR
INR: 1.51
PROTHROMBIN TIME: 18.1 s — AB (ref 11.4–15.2)

## 2018-06-03 MED ORDER — WARFARIN SODIUM 4 MG PO TABS
8.0000 mg | ORAL_TABLET | Freq: Once | ORAL | Status: AC
  Administered 2018-06-03: 8 mg via ORAL
  Filled 2018-06-03: qty 2

## 2018-06-03 MED ORDER — CHLORHEXIDINE GLUCONATE CLOTH 2 % EX PADS: 6.0000 | MEDICATED_PAD | Freq: Every day | CUTANEOUS | Status: DC

## 2018-06-03 NOTE — Progress Notes (Signed)
ANTICOAGULATION CONSULT NOTE  Pharmacy Consult:  Heparin/Warfarin Indication:  LVAD  Allergies  Allergen Reactions  . Metformin And Related Diarrhea    Patient Measurements: Height: 5' 8"  (172.7 cm) Weight: 182 lb 1.6 oz (82.6 kg) IBW/kg (Calculated) : 68.4 Heparin Dosing Weight: 80 kg  Vital Signs: Temp: 98.8 F (37.1 C) (09/29 0813) Temp Source: Oral (09/29 0813) BP: 126/70 (09/29 0813) Pulse Rate: 85 (09/29 0813)  Labs: Recent Labs    06/01/18 0350 06/02/18 0500 06/03/18 0515  HGB 9.4* 10.1* 9.5*  HCT 30.4* 32.4* 30.7*  PLT 248 254 258  LABPROT 17.3* 17.6* 18.1*  INR 1.43 1.46 1.51  HEPARINUNFRC 0.45 0.42 0.40  CREATININE 7.45* 4.67* 7.12*    Estimated Creatinine Clearance: 12.6 mL/min (A) (by C-G formula based on SCr of 7.12 mg/dL (H)).  Assessment: 36 yoM with PMH HF s/p HM3 and Factor 5 Leiden on warfarin PTA. Warfarin had been on hold for HD access and pt on IV heparin bridge but has now been restarted. .  Heparin level 0.40 this morning, INR subtherapeutic at 1.51 slow trend up overnight. LDH/CBC stable.  *PTA Warfarin Dose = 60m daily  Goal of Therapy:  Heparin level 0.3-0.7 units/ml  INR 2-3 Monitor platelets by anticoagulation protocol: Yes   Plan: -Continue heparin 1400 units/hr -Warfarin 823mPO x1 tonight -Daily INR, heparin level, CBC, LDH  FrErin HearingharmD., BCPS Clinical Pharmacist 06/03/2018 8:23 AM  Please check AMION for all [facility] numbers 06/03/2018

## 2018-06-03 NOTE — Progress Notes (Signed)
Patient ID: [name], male   DOB: 18-Jul-1966, 52 y.o.   MRN: [id number]   Advanced Heart Failure VAD Team Note  PCP-Cardiologist: No primary care provider on file.   Subjective:    RUE AVG thrombosed, unable to restore flow.   S/P LUE AV fistula placement.   INR 1.5. On heparin drip.  No bleeding   Feels ok. Denies nause, SOB, orthopnea or PND.   LVAD INTERROGATION:  HeartMate 3 LVAD:   Flow 4.1 liters/min, speed 5500, power 4.4  PI 3.6.  No PI events.      Objective:    Vital Signs:   Temp:  [98.5 F (36.9 C)-98.8 F (37.1 C)] 98.8 F (37.1 C) (09/29 0813) Pulse Rate:  [76-85] 85 (09/29 0813) Resp:  [17-34] 19 (09/29 0813) BP: (95-126)/(70-89) 126/70 (09/29 0813) SpO2:  [97 %-100 %] 97 % (09/29 0813) Weight:  [82.6 kg] 82.6 kg (09/29 0600) Last BM Date: 06/01/18 Mean arterial Pressure 80s  Intake/Output:   Intake/Output Summary (Last 24 hours) at 06/03/2018 0905 Last data filed at 06/03/2018 0600 Gross per 24 hour  Intake 1225.75 ml  Output 0 ml  Net 1225.75 ml     Physical Exam    Physical Exam: General: Well appearing this am. NAD.  HEENT: Normal. Neck: Supple, JVP 7-8 cm. Carotids OK.  Cardiac:  Mechanical heart sounds with LVAD hum present.  Lungs:  CTAB, normal effort.  Abdomen:  NT, ND, no HSM. No bruits or masses. +BS  LVAD exit site: Well-healed and incorporated. Dressing dry and intact. No erythema or drainage. Stabilization device present and accurately applied. Driveline dressing changed daily per sterile technique. Extremities:  Warm and dry. No cyanosis, clubbing, rash, or edema.  Neuro:  Alert & oriented x 3. Cranial nerves grossly intact. Moves all 4 extremities w/o difficulty. Affect pleasant     Telemetry   NSR 80-90s personally reviewed.   EKG    No new tracings.   Labs   Basic Metabolic Panel: Recent Labs  Lab 05/30/18 0556 05/31/18 0530 06/01/18 0350 06/02/18 0500 06/03/18 0515  NA 135 137 138 137 136  K 4.4  3.6 3.6 3.4* 3.4*  CL 102 99 101 99 100  CO2 25 28 29 27 26   GLUCOSE 116* 101* 150* 129* 153*  BUN 30* 19 28* 17 30*  CREATININE 7.01* 5.46* 7.45* 4.67* 7.12*  CALCIUM 8.3* 8.5* 8.5* 8.7* 8.6*  PHOS 3.7 4.5 4.6 3.1 3.6    Liver Function Tests: Recent Labs  Lab 05/30/18 0556 05/31/18 0530 06/01/18 0350 06/02/18 0500 06/03/18 0515  ALBUMIN 2.6* 2.7* 2.6* 2.6* 2.7*   No results for input(s): LIPASE, AMYLASE in the last 168 hours. No results for input(s): AMMONIA in the last 168 hours.  CBC: Recent Labs  Lab 05/30/18 0555 05/31/18 0530 06/01/18 0350 06/02/18 0500 06/03/18 0515  WBC 10.4 9.8 10.0 11.2* 10.1  HGB 9.5* 10.3* 9.4* 10.1* 9.5*  HCT 30.4* 32.5* 30.4* 32.4* 30.7*  MCV 89.1 89.8 90.2 90.3 90.8  PLT 217 234 248 254 258    INR: Recent Labs  Lab 05/30/18 0556 05/31/18 0530 06/01/18 0350 06/02/18 0500 06/03/18 0515  INR 1.14 1.22 1.43 1.46 1.51    Other results:     Imaging   No results found.   Medications:     Scheduled Medications: . sodium chloride   Intravenous Once  . aspirin EC  81 mg Oral Daily  . atorvastatin  40 mg Oral q1800  . calcitRIOL  0.5  mcg Oral Q M,W,F  . Chlorhexidine Gluconate Cloth  6 each Topical Q0600  . citalopram  20 mg Oral Daily  . darbepoetin (ARANESP) injection - DIALYSIS  40 mcg Intravenous Q Fri-HD  . dronabinol  2.5 mg Oral BID AC  . erythromycin  250 mg Oral TID  . feeding supplement (NEPRO CARB STEADY)  237 mL Oral BID BM  . Influenza vac split quadrivalent PF  0.5 mL Intramuscular Tomorrow-1000  . insulin aspart  5 Units Subcutaneous TID WC  . insulin glargine  10 Units Subcutaneous Daily  . isosorbide-hydrALAZINE  1.5 tablet Oral 3 times per day on Sun Tue Thu Sat  . metoCLOPramide  10 mg Oral TID  . pantoprazole  40 mg Oral BID  . warfarin  8 mg Oral ONCE-1800  . Warfarin - Pharmacist Dosing Inpatient   Does not apply q1800    Infusions: . heparin 1,400 Units/hr (06/03/18 0600)    PRN  Medications: acetaminophen, docusate sodium, LORazepam, LORazepam, ondansetron (ZOFRAN) IV, promethazine, promethazine   Patient Profile   [name] a 52 y.o.malewith history of CAD s/p anterior MI in 2010 and NSTEMI in 3/18 with DES to pRCA and mLAD and ischemic cardiomyopathy now s/p Heartmate 3 LVAD and ESRD He was admitted in 10/18 and Heartmate 3 LVAD was placed. He had baseline CKD stage 3 likely from diabetic nephropathy and CHF. He developed peri-op AKI and ended up on dialysis. No renal recovery to date.  Admitted with malfunctioning AVF. Nephrology recommended admit.   Assessment/Plan:    1. ESRD with clotted AV graft: Unable to unclot graft in cath lab.  S/P AV fistula LUE 9/24. Plan for 2nd stage 6 weeks. He has a palpable thrill at AVF site. Currently dialyzing through tunneled HD cath. Nephrology following.   - HD M-W-F. Tolerating well. Volume status ok. 2. 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 Volume managed per dialysis. Volume looks good. LDH 164.  - Continue Bidil on non dialysis days, MAP stable.  - Continue asa 81 mg daily.  - On heparin and coumadin, INR 1.5 today.  Can stop heparin gtt and go home when INR > 1.8.  3. HTN: Stable. Continue Bidil on non-HD days.  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 - Coumadin restarted and on heparin. INR 1.46. No bleeding on heparin. Discussed dosing with PharmD personally. 5. Diabetic gastroparesis:Nausea resolved.   - Continue reglan 5 mg tid/ac and Marinol.  - Continue Protonix bid.  - Continue eythromycin indefinitely.  - can use Ativan as needed.   6. GL:OVFIEPPI home regimen. No change. 7. RJJ:OACZYSAY bilateral disease on ABIs in 8/18. Denies claudication currently, no pedal ulcers. No change. 8. Atrial flutter: Paroxysmal. Remains in NSR.  9. CAD: No chest pain. - Continue statin and asa.  10. Difficult IV access: Would eventually like a port placed.  11.  Anemia: In setting of bleeding from catheter site, now resolved.  No overt GI bleeding.  9/23 received 2UPRBCs.  Hgb 9.4  12. Fever: Afebrile.  Blood CX NGTD.  Now off vancomycin/Zosyn.   Home when INR 1.8 and can stop heparin gtt.   I reviewed the LVAD parameters from today, and compared the results to the patient's prior recorded data.  No programming changes were made.  The LVAD is functioning within specified parameters.  The patient performs LVAD self-test daily.  LVAD interrogation was negative for any significant power changes, alarms or PI events/speed drops.  LVAD equipment  check completed and is in good working order.  Back-up equipment present.   LVAD education done on emergency procedures and precautions and reviewed exit site care.  Length of Stay: 109  Loralie Champagne, MD 06/03/2018, 9:05 AM  VAD Team --- VAD ISSUES ONLY--- Pager [phone] (7am - 7am)  Advanced Heart Failure Team  Pager [phone] (M-F; 7a - 4p)  Please contact [facility] for night-coverage after hours (4p -7a ) and weekends on [email or web address]

## 2018-06-03 NOTE — Progress Notes (Signed)
[name]  Assessment/ Plan: Pt is a 52 y.o. yo male admitted with clotted right arm AV graft, underwent OR on 9/19,clotted AVG unable to salvage due to severely atretic draining veins. MWF [facility] 4h 38mn 78.5kg (raising edw to ~80 kg here) 3k/2Ca bath RUA AVG Hep none - calc 0.5ug tiw - no ESA  #Clotted right arm AVG:  -s/p left arm first stage basilic vein transposition fistula by Dr. CDonzetta Matterson 05/29/2018. Plan for second stage procedure in 6 weeks with dialysis access duplex.    # ESRD: HD MWF. Plan for next dialysis tomorrow.  Volume status acceptable.  Next dialysis on Monday.  EDW to 80 kg.    # CHF/LVAD; on coumadin and heparin bridge. INR 1.51  # Anemia: monitor cbc, s/p 2 units of PRBC on 9/23.  No further bleeding from catheter site noticed.  Continue Aranesp.  # Secondary hyperparathyroidism: Calcium, phos acceptable. On calcitriol.  # HTN/volume: Blood pressure acceptable.    Subjective: Seen and examined at bedside.  Resting well.  Denies headache, dizziness, chest pain or shortness of breath. Objective Vital signs in last 24 hours: Vitals:   06/02/18 2000 06/03/18 0000 06/03/18 0600 06/03/18 0813  BP: 111/89 110/87 109/82 126/70  Pulse: 80 79 84 85  Resp: (!) 23 18 17 19   Temp: 98.6 F (37 C) 98.7 F (37.1 C) 98.7 F (37.1 C) 98.8 F (37.1 C)  TempSrc: Oral Oral Oral Oral  SpO2: 99% 100% 98% 97%  Weight:   82.6 kg   Height:       Weight change: 0.1 kg  Intake/Output Summary (Last 24 hours) at 06/03/2018 0927 Last data filed at 06/03/2018 0600 Gross per 24 hour  Intake 1225.75 ml  Output 0 ml  Net 1225.75 ml       Labs: Basic Metabolic Panel: Recent Labs  Lab 06/01/18 0350 06/02/18 0500 06/03/18 0515  NA 138 137 136  K 3.6 3.4* 3.4*  CL 101 99 100  CO2 29 27 26   GLUCOSE 150* 129* 153*  BUN 28* 17 30*  CREATININE 7.45* 4.67* 7.12*  CALCIUM 8.5* 8.7* 8.6*  PHOS 4.6 3.1 3.6   Liver Function  Tests: Recent Labs  Lab 06/01/18 0350 06/02/18 0500 06/03/18 0515  ALBUMIN 2.6* 2.6* 2.7*   No results for input(s): LIPASE, AMYLASE in the last 168 hours. No results for input(s): AMMONIA in the last 168 hours. CBC: Recent Labs  Lab 05/30/18 0555 05/31/18 0530 06/01/18 0350 06/02/18 0500 06/03/18 0515  WBC 10.4 9.8 10.0 11.2* 10.1  HGB 9.5* 10.3* 9.4* 10.1* 9.5*  HCT 30.4* 32.5* 30.4* 32.4* 30.7*  MCV 89.1 89.8 90.2 90.3 90.8  PLT 217 234 248 254 258   Cardiac Enzymes: No results for input(s): CKTOTAL, CKMB, CKMBINDEX, TROPONINI in the last 168 hours. CBG: Recent Labs  Lab 06/02/18 0810 06/02/18 1212 06/02/18 1648 06/02/18 2149 06/03/18 0815  GLUCAP 149* 112* 152* 139* 172*    Iron Studies: No results for input(s): IRON, TIBC, TRANSFERRIN, FERRITIN in the last 72 hours. Studies/Results: No results found.  Medications: Infusions: . heparin 1,400 Units/hr (06/03/18 0600)    Scheduled Medications: . sodium chloride   Intravenous Once  . aspirin EC  81 mg Oral Daily  . atorvastatin  40 mg Oral q1800  . calcitRIOL  0.5 mcg Oral Q M,W,F  . Chlorhexidine Gluconate Cloth  6 each Topical Q0600  . citalopram  20 mg Oral Daily  . darbepoetin (ARANESP) injection - DIALYSIS  40 mcg Intravenous Q Fri-HD  . dronabinol  2.5 mg Oral BID AC  . erythromycin  250 mg Oral TID  . feeding supplement (NEPRO CARB STEADY)  237 mL Oral BID BM  . Influenza vac split quadrivalent PF  0.5 mL Intramuscular Tomorrow-1000  . insulin aspart  5 Units Subcutaneous TID WC  . insulin glargine  10 Units Subcutaneous Daily  . isosorbide-hydrALAZINE  1.5 tablet Oral 3 times per day on Sun Tue Thu Sat  . metoCLOPramide  10 mg Oral TID  . pantoprazole  40 mg Oral BID  . warfarin  8 mg Oral ONCE-1800  . Warfarin - Pharmacist Dosing Inpatient   Does not apply q1800    have reviewed scheduled and prn medications.  Physical Exam: General: Not in distress, comfortable Heart:mechanical sound,   Lungs: Clear bilateral, no crackles Abdomen:soft, Non-tender, non-distended Extremities: No edema Dialysis Access: Left IJ tunnel catheter site with no bleeding.  Right upper extremity AV graft no thrill or bruit.  [name] [name] 06/03/2018,9:27 AM  LOS: 11 days

## 2018-06-04 ENCOUNTER — Ambulatory Visit (HOSPITAL_COMMUNITY): Payer: Self-pay

## 2018-06-04 LAB — RENAL FUNCTION PANEL
ANION GAP: 8 (ref 5–15)
Albumin: 2.8 g/dL — ABNORMAL LOW (ref 3.5–5.0)
BUN: 45 mg/dL — ABNORMAL HIGH (ref 6–20)
CALCIUM: 8.7 mg/dL — AB (ref 8.9–10.3)
CO2: 25 mmol/L (ref 22–32)
Chloride: 105 mmol/L (ref 98–111)
Creatinine, Ser: 9.18 mg/dL — ABNORMAL HIGH (ref 0.61–1.24)
GFR calc non Af Amer: 6 mL/min — ABNORMAL LOW (ref >60)
GFR, EST AFRICAN AMERICAN: 7 mL/min — AB (ref >60)
Glucose, Bld: 157 mg/dL — ABNORMAL HIGH (ref 70–99)
PHOSPHORUS: 3.5 mg/dL (ref 2.5–4.6)
POTASSIUM: 3.9 mmol/L (ref 3.5–5.1)
SODIUM: 138 mmol/L (ref 135–145)

## 2018-06-04 LAB — PROTIME-INR
INR: 1.64
Prothrombin Time: 19.3 seconds — ABNORMAL HIGH (ref 11.4–15.2)

## 2018-06-04 LAB — GLUCOSE, CAPILLARY
Glucose-Capillary: 127 mg/dL — ABNORMAL HIGH (ref 70–99)
Glucose-Capillary: 126 mg/dL — ABNORMAL HIGH (ref 70–99)
Glucose-Capillary: 215 mg/dL — ABNORMAL HIGH (ref 70–99)
Glucose-Capillary: 142 mg/dL — ABNORMAL HIGH (ref 70–99)

## 2018-06-04 LAB — CBC
HEMATOCRIT: 28.6 % — AB (ref 39.0–52.0)
HEMOGLOBIN: 9 g/dL — AB (ref 13.0–17.0)
MCH: 28.8 pg (ref 26.0–34.0)
MCHC: 31.5 g/dL (ref 30.0–36.0)
MCV: 91.4 fL (ref 78.0–100.0)
Platelets: 258 10*3/uL (ref 150–400)
RBC: 3.13 MIL/uL — ABNORMAL LOW (ref 4.22–5.81)
RDW: 17.2 % — AB (ref 11.5–15.5)
WBC: 9.6 10*3/uL (ref 4.0–10.5)

## 2018-06-04 LAB — LACTATE DEHYDROGENASE: LDH: 171 U/L (ref 98–192)

## 2018-06-04 LAB — HEPARIN LEVEL (UNFRACTIONATED): Heparin Unfractionated: 0.34 IU/mL (ref 0.30–0.70)

## 2018-06-04 MED ORDER — WARFARIN SODIUM 4 MG PO TABS
8.0000 mg | ORAL_TABLET | Freq: Once | ORAL | Status: AC
  Administered 2018-06-04: 8 mg via ORAL
  Filled 2018-06-04: qty 2

## 2018-06-04 MED ORDER — HEPARIN SODIUM (PORCINE) 1000 UNIT/ML IJ SOLN
INTRAMUSCULAR | Status: AC
  Filled 2018-06-04: qty 4

## 2018-06-04 NOTE — Progress Notes (Addendum)
Patient ID: [name], male   DOB: Nov 04, 1965, 52 y.o.   MRN: [id number]   Advanced Heart Failure VAD Team Note  PCP-Cardiologist: No primary care provider on file.   Subjective:    RUE AVG thrombosed, unable to restore flow.   S/P LUE AV fistula placement.   INR 1.64. On heparin drip.    Nauseated today. Received ativan and phenergan this morning. Denies SOB.   LVAD INTERROGATION:  HeartMate 3 LVAD:   Flow 3.9 liters/min, speed 5500, power 4  PI 3.7  Objective:    Vital Signs:   Temp:  [98.2 F (36.8 C)-99 F (37.2 C)] 98.2 F (36.8 C) (09/30 0600) Pulse Rate:  [85] 85 (09/29 0813) Resp:  [18-24] 18 (09/30 0600) BP: (97-126)/(69-95) 111/95 (09/30 0600) SpO2:  [97 %] 97 % (09/29 0813) Last BM Date: 06/02/18 Mean arterial Pressure 90s   Intake/Output:   Intake/Output Summary (Last 24 hours) at 06/04/2018 0731 Last data filed at 06/04/2018 0600 Gross per 24 hour  Intake 1666.33 ml  Output 0 ml  Net 1666.33 ml     Physical Exam   Physical Exam: GENERAL: NAD in bed.  HEENT: normal  NECK: Supple, JVP ~10 .  2+ bilaterally, no bruits.  No lymphadenopathy or thyromegaly appreciated.   CARDIAC:  Mechanical heart sounds with LVAD hum present.  LUNGS:  Clear to auscultation bilaterally.  ABDOMEN:  Soft, round, nontender, positive bowel sounds x4.     LVAD exit site: well-healed and incorporated.  Dressing dry and intact.  No erythema or drainage.  Stabilization device present and accurately applied.  Driveline dressing is being changed daily per sterile technique. EXTREMITIES:  Warm and dry, no cyanosis, clubbing, rash or edema  NEUROLOGIC:  Alert and oriented x 4.  Gait steady.  No aphasia.  No dysarthria.  Affect pleasant.      Telemetry   NSR 80-90s  EKG    No new tracings.   Labs   Basic Metabolic Panel: Recent Labs  Lab 05/30/18 0556 05/31/18 0530 06/01/18 0350 06/02/18 0500 06/03/18 0515  NA 135 137 138 137 136  K 4.4 3.6 3.6 3.4* 3.4*    CL 102 99 101 99 100  CO2 25 28 29 27 26   GLUCOSE 116* 101* 150* 129* 153*  BUN 30* 19 28* 17 30*  CREATININE 7.01* 5.46* 7.45* 4.67* 7.12*  CALCIUM 8.3* 8.5* 8.5* 8.7* 8.6*  PHOS 3.7 4.5 4.6 3.1 3.6    Liver Function Tests: Recent Labs  Lab 05/30/18 0556 05/31/18 0530 06/01/18 0350 06/02/18 0500 06/03/18 0515  ALBUMIN 2.6* 2.7* 2.6* 2.6* 2.7*   No results for input(s): LIPASE, AMYLASE in the last 168 hours. No results for input(s): AMMONIA in the last 168 hours.  CBC: Recent Labs  Lab 05/30/18 0555 05/31/18 0530 06/01/18 0350 06/02/18 0500 06/03/18 0515  WBC 10.4 9.8 10.0 11.2* 10.1  HGB 9.5* 10.3* 9.4* 10.1* 9.5*  HCT 30.4* 32.5* 30.4* 32.4* 30.7*  MCV 89.1 89.8 90.2 90.3 90.8  PLT 217 234 248 254 258    INR: Recent Labs  Lab 05/30/18 0556 05/31/18 0530 06/01/18 0350 06/02/18 0500 06/03/18 0515  INR 1.14 1.22 1.43 1.46 1.51    Other results:     Imaging   No results found.   Medications:     Scheduled Medications: . sodium chloride   Intravenous Once  . aspirin EC  81 mg Oral Daily  . atorvastatin  40 mg Oral q1800  . calcitRIOL  0.5 mcg  Oral Q M,W,F  . Chlorhexidine Gluconate Cloth  6 each Topical Q0600  . citalopram  20 mg Oral Daily  . darbepoetin (ARANESP) injection - DIALYSIS  40 mcg Intravenous Q Fri-HD  . dronabinol  2.5 mg Oral BID AC  . erythromycin  250 mg Oral TID  . feeding supplement (NEPRO CARB STEADY)  237 mL Oral BID BM  . Influenza vac split quadrivalent PF  0.5 mL Intramuscular Tomorrow-1000  . insulin aspart  5 Units Subcutaneous TID WC  . insulin glargine  10 Units Subcutaneous Daily  . isosorbide-hydrALAZINE  1.5 tablet Oral 3 times per day on Sun Tue Thu Sat  . metoCLOPramide  10 mg Oral TID  . pantoprazole  40 mg Oral BID  . Warfarin - Pharmacist Dosing Inpatient   Does not apply q1800    Infusions: . heparin 1,400 Units/hr (06/04/18 0600)    PRN Medications: acetaminophen, docusate sodium, LORazepam,  LORazepam, ondansetron (ZOFRAN) IV, promethazine, promethazine   Patient Profile   [name] a 52 y.o.malewith history of CAD s/p anterior MI in 2010 and NSTEMI in 3/18 with DES to pRCA and mLAD and ischemic cardiomyopathy now s/p Heartmate 3 LVAD and ESRD He was admitted in 10/18 and Heartmate 3 LVAD was placed. He had baseline CKD stage 3 likely from diabetic nephropathy and CHF. He developed peri-op AKI and ended up on dialysis. No renal recovery to date.  Admitted with malfunctioning AVF. Nephrology recommended admit.   Assessment/Plan:    1. ESRD with clotted AV graft: Unable to unclot graft in cath lab.  S/P AV fistula LUE 9/24. Plan for 2nd stage 6 weeks. He has a palpable thrill at AVF site. Currently dialyzing through tunneled HD cath. Nephrology following.   - HD M-W-F.  2. 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 Volume managed per dialysis. Volume looks good. LDH  Pending. - Continue Bidil on non dialysis days, MAP stable.  - Continue asa 81 mg daily.  - On heparin and coumadin, INR 1.64   - Can stop heparin gtt and go home when INR > 1.8.  3. HTN: Stable. Continue Bidil on non-HD days.  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 - Coumadin restarted and on heparin. INR 1.6 4 . No bleeding on heparin. Discussed dosing with PharmD personally. 5. Diabetic gastroparesis: - Nausea this morning.  Continue the below plan.  - Continue reglan 5 mg tid/ac and Marinol.  - Continue Protonix bid.  - Continue eythromycin indefinitely.  - can use Ativan and Phenergan as needed.   6. UT:MLYYTKPT home regimen. No change. 7. WSF:KCLEXNTZ bilateral disease on ABIs in 8/18. Denies claudication currently, no pedal ulcers. No change. 8. Atrial flutter: Paroxysmal. Remains in NSR.  INR 1.64  9. CAD: No chest pain.  - Continue statin and asa.  10. Difficult IV access: Would eventually like a port placed.  11. Anemia: In setting of  bleeding from catheter site, now resolved.  No overt GI bleeding.  9/23 received 2UPRBCs.  Hgb pending.  12. Fever: Afebrile.  Blood CX NGTD.  Now off vancomycin/Zosyn.   I reviewed the LVAD parameters from today, and compared the results to the patient's prior recorded data.  No programming changes were made.  The LVAD is functioning within specified parameters.  The patient performs LVAD self-test daily.  LVAD interrogation was negative for any significant power changes, alarms or PI events/speed drops.  LVAD equipment check completed and is in good working order.  Back-up equipment present.   LVAD education done on emergency procedures and precautions and reviewed exit site care.  Length of Stay: Donaldson, NP 06/04/2018, 7:31 AM  VAD Team --- VAD ISSUES ONLY--- Pager [phone] (7am - 7am)  Advanced Heart Failure Team  Pager [phone] (M-F; 7a - 4p)  Please contact [facility] for night-coverage after hours (4p -7a ) and weekends on [email or web address]  Patient seen with NP, agree with the above note.  Hgb 9 today, no overt bleeding.   He is nauseated today, suspect due to gastroparesis.  Symptomatic treatment as above.   Needs to walk around unit more, has not been getting out of bed.   Getting HD today.   INR 1.64 today, hopefully will be up to 1.8 tomorrow so we can stop heparin gtt and let him go home.   [name] 06/04/2018 10:07 AM

## 2018-06-04 NOTE — Progress Notes (Signed)
[facility] NEPHROLOGY PROGRESS NOTE  Summary: Pt is a 52 y.o. yo male admitted with clotted right arm AV graft, underwent OR on 9/19,clotted AVG unable to salvage due to severely atretic draining veins.  Subjective: Seen and examined on HD.  Denies headache, dizziness, chest pain or shortness of breath.   Objective Vital signs in last 24 hours: Vitals:   06/04/18 1130 06/04/18 1200 06/04/18 1219 06/04/18 1307  BP: 96/73 (!) 102/54 (!) 100/57   Pulse: 87 94 88   Resp:   (!) 22   Temp:   98 F (36.7 C) 98 F (36.7 C)  TempSrc:   Oral Oral  SpO2:   99%   Weight:      Height:       Medications: Infusions: . heparin 1,400 Units/hr (06/04/18 0600)    Scheduled Medications: . heparin      . sodium chloride   Intravenous Once  . aspirin EC  81 mg Oral Daily  . atorvastatin  40 mg Oral q1800  . calcitRIOL  0.5 mcg Oral Q M,W,F  . Chlorhexidine Gluconate Cloth  6 each Topical Q0600  . citalopram  20 mg Oral Daily  . darbepoetin (ARANESP) injection - DIALYSIS  40 mcg Intravenous Q Fri-HD  . dronabinol  2.5 mg Oral BID AC  . erythromycin  250 mg Oral TID  . feeding supplement (NEPRO CARB STEADY)  237 mL Oral BID BM  . Influenza vac split quadrivalent PF  0.5 mL Intramuscular Tomorrow-1000  . insulin aspart  5 Units Subcutaneous TID WC  . insulin glargine  10 Units Subcutaneous Daily  . isosorbide-hydrALAZINE  1.5 tablet Oral 3 times per day on Sun Tue Thu Sat  . metoCLOPramide  10 mg Oral TID  . pantoprazole  40 mg Oral BID  . Warfarin - Pharmacist Dosing Inpatient   Does not apply q1800    have reviewed scheduled and prn medications.  Physical Exam: General: Not in distress, comfortable Heart:mechanical sound,  Lungs: Clear bilateral, no crackles Abdomen:soft, Non-tender, non-distended Extremities: No edema Dialysis Access: Left IJ tunnel catheter site with no bleeding.  Right upper extremity AV graft no thrill or bruit.   Dialysis: MWF [facility] 4h 63mn  78.5kg (raising edw to ~80 kg here) 3k/2Ca bath RUA AVG Hep none - calc 0.5ug tiw - no ESA  Assessment/ Plan: 1. Clotted right arm AVG: AVG was not salvagable. Underwent LIJ PCI and then placement of new L arm first stage basilic vein transposition fistula by Dr. CDonzetta Matterson 05/29/2018. Plan for second stage procedure in 6 weeks with dialysis access duplex.   2. ESRD: HD MWF. HD today EDW to 80 kg.   3. CHF/LVAD; on coumadin and heparin bridge. INR 1.64. Awaiting therapeutic INR 4. Anemia: monitor cbc, s/p 2 units of PRBC on 9/23.  No further bleeding from catheter site noticed.  Continue Aranesp. 5. Secondary hyperparathyroidism: Calcium, phos acceptable. On calcitriol. 6. HTN/volume: Blood pressure acceptable.    RKelly SplinterMD CNewell Rubbermaidpgr [phone]  06/04/2018, 1:18 PM    Labs: Basic Metabolic Panel: Recent Labs  Lab 06/02/18 0500 06/03/18 0515 06/04/18 0700  NA 137 136 138  K 3.4* 3.4* 3.9  CL 99 100 105  CO2 27 26 25   GLUCOSE 129* 153* 157*  BUN 17 30* 45*  CREATININE 4.67* 7.12* 9.18*  CALCIUM 8.7* 8.6* 8.7*  PHOS 3.1 3.6 3.5   Liver Function Tests: Recent Labs  Lab 06/02/18 0500 06/03/18 0515 06/04/18  0700  ALBUMIN 2.6* 2.7* 2.8*   CBC: Recent Labs  Lab 05/31/18 0530 06/01/18 0350 06/02/18 0500 06/03/18 0515 06/04/18 0700  WBC 9.8 10.0 11.2* 10.1 9.6  HGB 10.3* 9.4* 10.1* 9.5* 9.0*  HCT 32.5* 30.4* 32.4* 30.7* 28.6*  MCV 89.8 90.2 90.3 90.8 91.4  PLT 234 248 254 258 258

## 2018-06-04 NOTE — Discharge Summary (Signed)
Advanced Heart Failure Team  Discharge Summary   Patient ID: [name] MRN: [id number], DOB/AGE: 07-Feb-1966 52 y.o. Admit date: 05/23/2018 D/C date:     06/05/2018    Primary Discharge Diagnoses:  1. ESRD with clotted AV graft - HD MWF - S/p AVF placement 05/29/18 - Has tunneled HD cath until AVF mature. 2. Chronic systolic HF - S/p St Jude ICD - S/p HM3 06/12/17 under DT - INR goal 2-3 on coumadin + ASA 81 mg daily 3. HTN 4. Hx of recurrent DVTs 5. Diabetic gastroparesis 6. DM 7. PAD 8. Atrial flutter: paroxysmal  9. CAD 10. Difficult IV access 11. Anemia 12. Fever  Hospital Course:  Chandlor E McLeanis a 52 y.o.malewith history of CAD s/p anterior MI in 2010 and NSTEMI in 3/18 with DES to pRCA and mLAD and ischemic cardiomyopathy now s/p Heartmate 3 LVAD and ESRDHe was admitted in 10/18 and Heartmate 3 LVAD was placed. He had baseline CKD stage 3 likely from diabetic nephropathy and CHF. He developed peri-op AKI and ended up on dialysis. No renal recovery to date.  Admitted 05/23/18 with malfunctioning AVF. Unable to unclot RUE graft in cath lab. Tunneled HD cath placed for HD. Underwent AV fistula placement 05/29/18. Remained inpatient until INR therapeutic. Problem based review of admission is below.  1. ESRD with clotted AV graft: Unable to unclot graft in cath lab.  S/P AV fistula LUE 9/24. Plan for 2nd stage 6 weeks. He has a palpable thrill at AVF site. Currently dialyzing through tunneled HD cath. Nephrology following.   - HD M-W-F.  2. 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 Volume managed per dialysis and remained stable. LDH remained stable.  - Continue Bidil on non dialysis days, MAP stable.  - Continue asa 81 mg daily.  - Coumadin was held for AVF placement. He was then on heparin/coumadin bridge until INR >1.8.  3. HTN: Remained stable. Continue Bidil on non-HD days.  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 - On  heparin/coumadin bridge until INR therapeutic. INR 1.90 on day of DC. 5. Diabetic gastroparesis: - Continue reglan 10 mg tid/ac and Marinol. Follows with Dr Derrill Kay at [facility], gastroparesis specialist - Continue Protonix bid.  - Continue eythromycin indefinitely.  - Continue Ativan and Phenergan as needed.   6. TK:WIOXBDZH home regimen. 7. GDJ:MEQASTMH bilateral disease on ABIs in 8/18. Denies claudication currently, no pedal ulcers. No change. 8. Atrial flutter: Paroxysmal. Remained in NSR.  9. CAD: No chest pain.  - Continue statin and asa.  10. Difficult IV access: Would eventually like a port placed. This was delayed due to need for HD cath. 11. Anemia: In setting of bleeding from catheter site, now resolved.  No overt GI bleeding.  9/23 received 2UPRBCs. Hbg stable 10.0 on day of discharge. 12. Fever:  Blood CX NGTD.  Received vancomycin/Zosyn.  He will be followed closely in VAD clinic, with appointment as below. He will check his INR on Thursday or Friday and communicate with HF pharmacist for further coumadin changes (both pt and pharmacist aware).   LVAD Interrogation HM III:   Speed: 5500     Flow: 4      PI: 3.7      Power: 4      Back-up speed: 5200      Discharge Weight: 180.8 lbs Discharge Vitals: Blood pressure (!) 125/98, pulse 85, temperature 99 F (37.2 C), temperature source Oral, resp. rate 20, height 5' 8"  (  1.727 m), weight 82 kg, SpO2 98 %.  Labs: Lab Results  Component Value Date   WBC 10.1 06/05/2018   HGB 10.0 (L) 06/05/2018   HCT 32.6 (L) 06/05/2018   MCV 92.4 06/05/2018   PLT 255 06/05/2018    Recent Labs  Lab 06/05/18 0338  NA 139  K 4.4  CL 103  CO2 27  BUN 27*  CREATININE 6.35*  CALCIUM 8.9  GLUCOSE 165*   Lab Results  Component Value Date   CHOL 128 05/04/2017   HDL 50 05/04/2017   LDLCALC 54 05/04/2017   TRIG 118 05/04/2017   BNP (last 3 results) Recent Labs    03/02/18 0400 03/08/18 0008 03/15/18 0106  BNP 694.1*  1,576.6* 811.3*    ProBNP (last 3 results) No results for input(s): PROBNP in the last 8760 hours.   Diagnostic Studies/Procedures   No results found.  Discharge Medications   Allergies as of 06/05/2018      Reactions   Metformin And Related Diarrhea      Medication List    TAKE these medications   aspirin EC 81 MG tablet Take 1 tablet (81 mg total) by mouth daily.   atorvastatin 40 MG tablet Commonly known as:  LIPITOR Take 1 tablet (40 mg total) by mouth daily at 6 PM.   calcitRIOL 0.5 MCG capsule Commonly known as:  ROCALTROL Take 1 capsule (0.5 mcg total) by mouth every Monday, Wednesday, and Friday. Start taking on:  06/06/2018   citalopram 20 MG tablet Commonly known as:  CELEXA Take 1 tablet (20 mg total) by mouth daily.   docusate sodium 100 MG capsule Commonly known as:  COLACE Take 1 capsule (100 mg total) by mouth 2 (two) times daily as needed for mild constipation.   dronabinol 2.5 MG capsule Commonly known as:  MARINOL Take 1 capsule (2.5 mg total) by mouth 2 (two) times daily before lunch and supper.   erythromycin 250 MG tablet Commonly known as:  E-MYCIN Take 1 tablet (250 mg total) by mouth 3 (three) times daily.   feeding supplement (NEPRO CARB STEADY) Liqd Take 237 mLs by mouth 2 (two) times daily between meals.   insulin aspart 100 UNIT/ML injection Commonly known as:  novoLOG Inject 5 Units into the skin 3 (three) times daily with meals.   insulin glargine 100 UNIT/ML injection Commonly known as:  LANTUS Inject 0.1 mLs (10 Units total) into the skin daily.   isosorbide-hydrALAZINE 20-37.5 MG tablet Commonly known as:  BIDIL Take 1.5 tablets by mouth every 8 (eight) hours. Take 1.5 tablets every 8 hours on non dialysis days.   LORazepam 0.5 MG tablet Commonly known as:  ATIVAN Take 1 tablet (0.5 mg total) by mouth every 12 (twelve) hours as needed (nausea).   metoCLOPramide 10 MG tablet Commonly known as:  REGLAN Take 1 tablet  (10 mg total) by mouth 3 (three) times daily.   multivitamin Tabs tablet Take 1 tablet by mouth daily.   ondansetron 4 MG disintegrating tablet Commonly known as:  ZOFRAN-ODT Take 1 tablet (4 mg total) by mouth every 6 (six) hours as needed for nausea or vomiting.   pantoprazole 40 MG tablet Commonly known as:  PROTONIX Take 40 mg by mouth 2 (two) times daily.   promethazine 12.5 MG tablet Commonly known as:  PHENERGAN Take 1 tablet (12.5 mg total) by mouth every 6 (six) hours as needed for nausea or vomiting.   warfarin 5 MG tablet Commonly known as:  COUMADIN  Take as directed. If you are unsure how to take this medication, talk to your nurse or doctor. Original instructions:  Take 1 tablet (5 mg total) by mouth daily at 6 PM.       Disposition   The patient will be discharged in stable condition to home. Discharge Instructions    (HEART FAILURE PATIENTS) Call MD:  Anytime you have any of the following symptoms: 1) 3 pound weight gain in 24 hours or 5 pounds in 1 week 2) shortness of breath, with or without a dry hacking cough 3) swelling in the hands, feet or stomach 4) if you have to sleep on extra pillows at night in order to breathe.   Complete by:  As directed    Call MD for:  redness, tenderness, or signs of infection (pain, swelling, redness, odor or green/yellow discharge around incision site)   Complete by:  As directed    Diet - low sodium heart healthy   Complete by:  As directed    INR  Goal: 2 - 3   Complete by:  As directed    Goal:  2 - 3   Increase activity slowly   Complete by:  As directed    Page VAD Coordinator at [phone]  Notify for: any VAD alarms, sustained elevations of power >10 watts, sustained drop in Pulse Index <3   Complete by:  As directed    Notify for:   any VAD alarms sustained elevations of power >10 watts sustained drop in Pulse Index <3     Speed Settings:   Complete by:  As directed    Fixed 5500 RPM Low 5200 RPM      Follow-up Information    Waynetta Sandy, MD In 6 weeks.   Specialties:  Vascular Surgery, Cardiology Why:  Office will call you to arrange your appt (sent) Contact information: [place] [address] [phone]        Larey Dresser, MD Follow up on 06/14/2018.   Specialty:  Cardiology Why:  10 am. Hospital follow up. Garage code: [id number]. Contact information: [place] [address] [phone]             Duration of Discharge Encounter: Greater than 35 minutes   Signed, Georgiana Shore, NP  06/05/2018, 11:08 AM

## 2018-06-04 NOTE — Progress Notes (Signed)
CARDIAC REHAB PHASE I   PRE:  Rate/Rhythm: 86 SR    BP: sitting 109/94 (101)    SaO2:   MODE:  Ambulation: 340 ft   POST:  Rate/Rhythm: 105 ST with PVCs    BP: sitting 109/83 (92)     SaO2:   Pt needed assist unscrewing batteries from clips as he sts someone else tightened them and they were too tight for him. Able to stand and walk pushing IV pole. No LOB or c/o. Slow, steady walk. To recliner, stayed on batteries. Denied any dizziness. BP in right forearm appropriate. Benson, ACSM 06/04/2018 2:23 PM

## 2018-06-04 NOTE — Progress Notes (Signed)
LVAD Coordinator Rounding Note:  Direct admitted 05/23/18 from dialysis center. Dialysis center unable to access RUA graft for dialysis treatment.  HM III LVAD implanted on 06/12/17 by Dr. Prescott Gum under Destination Therapy criteria due to renal insufficiency. Pump exchange on 03/01/18 for outflow graft twist/obstruction.   Pt walking in the hall with cardiac rehab. States that he is ready to go home, but pt informed that we will have to wait until his INR is higher.  Vital signs: Temp: 98.0 HR:  87 Doppler Pressure:  90 Auto BP:  100/57  O2 Sat: 99% on RA Wt: 79.9>79.5>81.3>88.1kg  LVAD interrogation reveals:  Speed: 5500 Flow: 4.3 Power: 4.5w PI: 3.0 Alarms: none  Events:  none Hematocrit: 23 Fixed speed: 5500 Low speed limit: 5200  Drive Line:  Weekly dressing changes. Next dressing change is due 06/07/18.  Labs: LDH trend: 191>195>184>191>166>152>158>150>171  INR trend: 2.24>1.84>1.47>1.31>1.18>1.14>1.22>1.43>1.64  Blood Products: - 05/28/18  2 units PCs     Anticoagulation Plan: - INR Goal: 2.0 - 3.0 - ASA Dose: 81 mg daily  - Heparin gtt until INR therapeutic - per pharmacy  Device: -St Jude dual  -Therapies: on 200 bpm - Monitor: on VT 150 bpm  Adverse Events on VAD: - ESRD post LVAD; started CVVH 06/15/17; HD started 06/20/17 - Hospitalization 11/5 - 07/16/17 > gastroparesis diagnosis after EGD - Hospitalization 11/26 - 08/04/17 > gastroparesis - Hospitalization 1/8 - 09/16/17>gastroparesis - referred to Dr. Corliss Parish at [facility] - 10/31/17>rightupper arm arteriovenous graft with Artegraft; ligation of right first stage brachial vein transposition  - 12/11/13 elective admission for thrombectomy for AV graft in RUA  - 12/20/17 > intractable N/V - 01/19/18 > intractable N/V sent by EMS from dialysis center - 03/01/18 > outflow graft twist/occulsion requiring pump exchange - 05/23/18 > admitted for non-working fistula in RUA   Plan/Recommendations:  1. Please page VAD  coordinator with any equipment questions or concerns.   [name] Monte RN [place] Coordinator  Office: [phone]  24/7 Pager: [phone]

## 2018-06-04 NOTE — Progress Notes (Signed)
ANTICOAGULATION CONSULT NOTE  Pharmacy Consult:  Heparin/Warfarin Indication:  LVAD  Allergies  Allergen Reactions  . Metformin And Related Diarrhea    Patient Measurements: Height: 5' 8"  (172.7 cm) Weight: (stood to scale) IBW/kg (Calculated) : 68.4 Heparin Dosing Weight: 80 kg  Vital Signs: Temp: 98 F (36.7 C) (09/30 1307) Temp Source: Oral (09/30 1307) BP: 100/57 (09/30 1219) Pulse Rate: 88 (09/30 1219)  Labs: Recent Labs    06/02/18 0500 06/03/18 0515 06/04/18 0700  HGB 10.1* 9.5* 9.0*  HCT 32.4* 30.7* 28.6*  PLT 254 258 258  LABPROT 17.6* 18.1* 19.3*  INR 1.46 1.51 1.64  HEPARINUNFRC 0.42 0.40 0.34  CREATININE 4.67* 7.12* 9.18*    Estimated Creatinine Clearance: 10 mL/min (A) (by C-G formula based on SCr of 9.18 mg/dL (H)).  Assessment: 89 yoM with PMH HF s/p HM3 and Factor 5 Leiden on warfarin PTA. Warfarin had been on hold for HD access and pt on IV heparin bridge but has now been restarted. .  Heparin level 0.34 this morning on heparin drip rate 1400 uts/hr, INR subtherapeutic at 1.68 slow trend up overnight - plan to DC when INR > 1.8. LDH/CBC stable.  *PTA Warfarin Dose = 31m daily  Goal of Therapy:  Heparin level 0.3-0.7 units/ml  INR 2-3 Monitor platelets by anticoagulation protocol: Yes   Plan: -Continue heparin 1400 units/hr -Warfarin 849mPO x1 tonight -Daily INR, heparin level, CBC, LDH   LiBonnita Nasutiharm.D. CPP, BCPS Clinical Pharmacist [phone]/30/2019 2:01 PM

## 2018-06-05 ENCOUNTER — Telehealth (HOSPITAL_COMMUNITY): Payer: Self-pay | Admitting: *Deleted

## 2018-06-05 LAB — CBC
HCT: 32.6 % — ABNORMAL LOW (ref 39.0–52.0)
HEMOGLOBIN: 10 g/dL — AB (ref 13.0–17.0)
MCH: 28.3 pg (ref 26.0–34.0)
MCHC: 30.7 g/dL (ref 30.0–36.0)
MCV: 92.4 fL (ref 78.0–100.0)
Platelets: 255 10*3/uL (ref 150–400)
RBC: 3.53 MIL/uL — AB (ref 4.22–5.81)
RDW: 17.8 % — ABNORMAL HIGH (ref 11.5–15.5)
WBC: 10.1 10*3/uL (ref 4.0–10.5)

## 2018-06-05 LAB — RENAL FUNCTION PANEL
ANION GAP: 9 (ref 5–15)
Albumin: 2.9 g/dL — ABNORMAL LOW (ref 3.5–5.0)
BUN: 27 mg/dL — ABNORMAL HIGH (ref 6–20)
CALCIUM: 8.9 mg/dL (ref 8.9–10.3)
CO2: 27 mmol/L (ref 22–32)
Chloride: 103 mmol/L (ref 98–111)
Creatinine, Ser: 6.35 mg/dL — ABNORMAL HIGH (ref 0.61–1.24)
GFR, EST AFRICAN AMERICAN: 10 mL/min — AB (ref >60)
GFR, EST NON AFRICAN AMERICAN: 9 mL/min — AB (ref >60)
GLUCOSE: 165 mg/dL — AB (ref 70–99)
PHOSPHORUS: 4.1 mg/dL (ref 2.5–4.6)
POTASSIUM: 4.4 mmol/L (ref 3.5–5.1)
SODIUM: 139 mmol/L (ref 135–145)

## 2018-06-05 LAB — GLUCOSE, CAPILLARY: GLUCOSE-CAPILLARY: 125 mg/dL — AB (ref 70–99)

## 2018-06-05 LAB — PROTIME-INR
INR: 1.9
Prothrombin Time: 21.6 seconds — ABNORMAL HIGH (ref 11.4–15.2)

## 2018-06-05 LAB — LACTATE DEHYDROGENASE: LDH: 167 U/L (ref 98–192)

## 2018-06-05 LAB — HEPARIN LEVEL (UNFRACTIONATED): Heparin Unfractionated: 0.28 IU/mL — ABNORMAL LOW (ref 0.30–0.70)

## 2018-06-05 MED ORDER — NEPRO/CARBSTEADY PO LIQD: 237.0000 mL | Freq: Two times a day (BID) | ORAL | 0 refills | Status: DC

## 2018-06-05 MED ORDER — WARFARIN SODIUM 5 MG PO TABS: 5.0000 mg | ORAL_TABLET | Freq: Every day | ORAL | Status: DC

## 2018-06-05 MED ORDER — CALCITRIOL 0.5 MCG PO CAPS: 0.5000 ug | ORAL_CAPSULE | ORAL | 5 refills | Status: DC

## 2018-06-05 MED ORDER — DOCUSATE SODIUM 100 MG PO CAPS: 100.0000 mg | ORAL_CAPSULE | Freq: Two times a day (BID) | ORAL | 0 refills | Status: DC | PRN

## 2018-06-05 NOTE — Progress Notes (Signed)
ANTICOAGULATION CONSULT NOTE  Pharmacy Consult:  Heparin/Warfarin Indication:  LVAD  Allergies  Allergen Reactions  . Metformin And Related Diarrhea    Patient Measurements: Height: 5' 8"  (172.7 cm) Weight: 180 lb 12.8 oz (82 kg) IBW/kg (Calculated) : 68.4 Heparin Dosing Weight: 80 kg  Vital Signs: Temp: 99 F (37.2 C) (10/01 0335) Temp Source: Oral (10/01 0335) BP: 124/97 (10/01 0335) Pulse Rate: 85 (10/01 0335)  Labs: Recent Labs    06/03/18 0515 06/04/18 0700 06/05/18 0338  HGB 9.5* 9.0* 10.0*  HCT 30.7* 28.6* 32.6*  PLT 258 258 255  LABPROT 18.1* 19.3* 21.6*  INR 1.51 1.64 1.90  HEPARINUNFRC 0.40 0.34 0.28*  CREATININE 7.12* 9.18* 6.35*    Estimated Creatinine Clearance: 13 mL/min (A) (by C-G formula based on SCr of 6.35 mg/dL (H)).  Assessment: 63 yoM with PMH HF s/p HM3 and Factor 5 Leiden on warfarin PTA. Warfarin had been on hold for HD access and pt on IV heparin bridge but has now been restarted. .  Heparin level 0.28  this morning on heparin drip rate 1400 uts/hr, INR subtherapeutic at 1.9 slow trend up overnight - plan to DC when INR > 1.8. LDH/CBC stable.  *PTA Warfarin Dose = 63m daily  Goal of Therapy:  Heparin level 0.3-0.7 units/ml  INR 2-3 Monitor platelets by anticoagulation protocol: Yes   Plan: -Stop heparin drip today -Warfarin 532mPO daily - home dose -Daily INR, CBC, LDH -If DC today follow up with INR toward end of this week  Will refer to outpatient LVAD clinic     LiBonnita Nasutiharm.D. CPP, BCPS Clinical Pharmacist [phone]/09/2017 7:22 AM

## 2018-06-05 NOTE — Discharge Instructions (Signed)
[facility]  Discharge Instructions  AV Fistula or Graft Surgery for Dialysis Access  Please refer to the following instructions for your post-procedure care. Your surgeon or physician assistant will discuss any changes with you.  Activity  You may drive the day following your surgery, if you are comfortable and no longer taking prescription pain medication. Resume full activity as the soreness in your incision resolves.  Bathing/Showering  You may shower after you go home. Keep your incision dry for 48 hours. Do not soak in a bathtub, hot tub, or swim until the incision heals completely. You may not shower if you have a hemodialysis catheter.  Incision Care  Clean your incision with mild soap and water after 48 hours. Pat the area dry with a clean towel. You do not need a bandage unless otherwise instructed. Do not apply any ointments or creams to your incision. You may have skin glue on your incision. Do not peel it off. It will come off on its own in about one week. Your arm may swell a bit after surgery. To reduce swelling use pillows to elevate your arm so it is above your heart. Your doctor will tell you if you need to lightly wrap your arm with an ACE bandage.  Diet  Resume your normal diet. There are not special food restrictions following this procedure. In order to heal from your surgery, it is CRITICAL to get adequate nutrition. Your body requires vitamins, minerals, and protein. Vegetables are the best source of vitamins and minerals. Vegetables also provide the perfect balance of protein. Processed food has little nutritional value, so try to avoid this.  Medications  Resume taking all of your medications. If your incision is causing pain, you may take over-the counter pain relievers such as acetaminophen (Tylenol). If you were prescribed a stronger pain medication, please be aware these medications can cause nausea and constipation. Prevent  nausea by taking the medication with a snack or meal. Avoid constipation by drinking plenty of fluids and eating foods with high amount of fiber, such as fruits, vegetables, and grains.  Do not take Tylenol if you are taking prescription pain medications.  Follow up Your surgeon may want to see you in the office following your access surgery. If so, this will be arranged at the time of your surgery.  Please call us immediately for any of the following conditions:  Increased pain, redness, drainage (pus) from your incision site Fever of 101 degrees or higher Severe or worsening pain at your incision site Hand pain or numbness.  Reduce your risk of vascular disease:  Stop smoking. If you would like help, call [facility] at 1-800-QUIT-NOW 289-614-9602) or [place] at [place] your cholesterol Maintain a desired weight Control your diabetes Keep your blood pressure down  Dialysis  It will take several weeks to several months for your new dialysis access to be ready for use. Your surgeon will determine when it is okay to use it. Your nephrologist will continue to direct your dialysis. You can continue to use your Permcath until your new access is ready for use.   05/29/2018 [name] [id number] [birth year]  Surgeon(s): Waynetta Sandy, MD  Procedure(s): 1st stage basilic vein transposition.  x Do not stick fistula for 12 weeks    If you have any questions, please call the office at [phone].   Information on my medicine - Coumadin   (Warfarin)  This medication education was reviewed  with me or my healthcare representative as part of my discharge preparation.   Why was Coumadin prescribed for you? Coumadin was prescribed for you because you have a blood clot or a medical condition that can cause an increased risk of forming blood clots. Blood clots can cause serious health problems by blocking the flow of blood to the heart, lung, or  brain. Coumadin can prevent harmful blood clots from forming. As a reminder your indication for Coumadin is:   Blood Clot Prevention After Heart Pump Surgery  What test will check on my response to Coumadin? While on Coumadin (warfarin) you will need to have an INR test regularly to ensure that your dose is keeping you in the desired range. The INR (international normalized ratio) number is calculated from the result of the laboratory test called prothrombin time (PT).  If an INR APPOINTMENT HAS NOT ALREADY BEEN MADE FOR YOU please schedule an appointment to have this lab work done by your health care provider within 7 days. Your INR goal is a number between:  2 to 3 What  do you need to  know  About  COUMADIN? Take Coumadin (warfarin) exactly as prescribed by your healthcare provider about the same time each day.  DO NOT stop taking without talking to the doctor who prescribed the medication.  Stopping without other blood clot prevention medication to take the place of Coumadin may increase your risk of developing a new clot or stroke.  Get refills before you run out.  What do you do if you miss a dose? If you miss a dose, take it as soon as you remember on the same day then continue your regularly scheduled regimen the next day.  Do not take two doses of Coumadin at the same time.  Important Safety Information A possible side effect of Coumadin (Warfarin) is an increased risk of bleeding. You should call your healthcare provider right away if you experience any of the following: ? Bleeding from an injury or your nose that does not stop. ? Unusual colored urine (red or dark brown) or unusual colored stools (red or black). ? Unusual bruising for unknown reasons. ? A serious fall or if you hit your head (even if there is no bleeding).  Some foods or medicines interact with Coumadin (warfarin) and might alter your response to warfarin. To help avoid this: ? Eat a balanced diet, maintaining a  consistent amount of Vitamin K. ? Notify your provider about major diet changes you plan to make. ? Avoid alcohol or limit your intake to 1 drink for women and 2 drinks for men per day. (1 drink is 5 oz. wine, 12 oz. beer, or 1.5 oz. liquor.)  Make sure that ANY health care provider who prescribes medication for you knows that you are taking Coumadin (warfarin).  Also make sure the healthcare provider who is monitoring your Coumadin knows when you have started a new medication including herbals and non-prescription products.  Coumadin (Warfarin)  Major Drug Interactions  Increased Warfarin Effect Decreased Warfarin Effect  Alcohol (large quantities) Antibiotics (esp. Septra/Bactrim, Flagyl, Cipro) Amiodarone (Cordarone) Aspirin (ASA) Cimetidine (Tagamet) Megestrol (Megace) NSAIDs (ibuprofen, naproxen, etc.) Piroxicam (Feldene) Propafenone (Rythmol SR) Propranolol (Inderal) Isoniazid (INH) Posaconazole (Noxafil) Barbiturates (Phenobarbital) Carbamazepine (Tegretol) Chlordiazepoxide (Librium) Cholestyramine (Questran) Griseofulvin Oral Contraceptives Rifampin Sucralfate (Carafate) Vitamin K   Coumadin (Warfarin) Major Herbal Interactions  Increased Warfarin Effect Decreased Warfarin Effect  Garlic Ginseng Ginkgo biloba Coenzyme Q10 Green tea St. Johns wort  Coumadin (Warfarin) FOOD Interactions  Eat a consistent number of servings per week of foods HIGH in Vitamin K (1 serving =  cup)  Collards (cooked, or boiled & drained) Kale (cooked, or boiled & drained) Mustard greens (cooked, or boiled & drained) Parsley *serving size only =  cup Spinach (cooked, or boiled & drained) Swiss chard (cooked, or boiled & drained) Turnip greens (cooked, or boiled & drained)  Eat a consistent number of servings per week of foods MEDIUM-HIGH in Vitamin K (1 serving = 1 cup)  Asparagus (cooked, or boiled & drained) Broccoli (cooked, boiled & drained, or raw & chopped) Brussel  sprouts (cooked, or boiled & drained) *serving size only =  cup Lettuce, raw (green leaf, endive, romaine) Spinach, raw Turnip greens, raw & chopped   These websites have more information on Coumadin (warfarin):  FailFactory.se; VeganReport.com.au;

## 2018-06-05 NOTE — Telephone Encounter (Signed)
Restrictions form and records faxed to [facility] as requested.

## 2018-06-05 NOTE — Progress Notes (Addendum)
Patient ID: [name], male   DOB: [birth year], 52 y.o.   MRN: [id number]   Advanced Heart Failure VAD Team Note  PCP-Cardiologist: No primary care provider on file.   Subjective:    RUE AVG thrombosed, unable to restore flow.   S/P LUE AV fistula placement.   INR 1.90. On heparin drip. Hemoglobin stable 10.0  Tmax 99.2. WBC 10.1.  Tired this morning. Denies CP or SOB. Nausea okay.  LVAD INTERROGATION:  HeartMate 3 LVAD:   Flow 4.0 liters/min, speed 5500, power 4  PI 3.9  Objective:    Vital Signs:   Temp:  [98 F (36.7 C)-99.2 F (37.3 C)] 99.2 F (37.3 C) (10/01 0817) Pulse Rate:  [77-108] 85 (10/01 0817) Resp:  [11-26] 18 (10/01 0817) BP: (79-124)/(54-97) 79/59 (10/01 0817) SpO2:  [94 %-99 %] 98 % (10/01 0817) Weight:  [80.4 kg-82 kg] 82 kg (10/01 0500) Last BM Date: 06/02/18 Mean arterial Pressure 90-100  Intake/Output:   Intake/Output Summary (Last 24 hours) at 06/05/2018 0820 Last data filed at 06/05/2018 0733 Gross per 24 hour  Intake 1443.66 ml  Output 3000 ml  Net -1556.34 ml     Physical Exam   Physical Exam: General: NAD.  HEENT: Normal. Neck: Supple, JVP ~10 cm. Carotids OK.  Cardiac:  Mechanical heart sounds with LVAD hum present.  Lungs:  CTAB, normal effort.  Abdomen:  NT, ND, no HSM. No bruits or masses. +BS  LVAD exit site:Dressing dry and intact. No erythema or drainage.  Extremities:  Warm and dry. No cyanosis, clubbing, rash, or edema.  Neuro:  Alert & oriented x 3. Cranial nerves grossly intact. Moves all 4 extremities w/o difficulty. Affect pleasant     Telemetry   NSR 80s. Personally reviewed  EKG   No new tracings.   Labs   Basic Metabolic Panel: Recent Labs  Lab 06/01/18 0350 06/02/18 0500 06/03/18 0515 06/04/18 0700 06/05/18 0338  NA 138 137 136 138 139  K 3.6 3.4* 3.4* 3.9 4.4  CL 101 99 100 105 103  CO2 29 27 26 25 27   GLUCOSE 150* 129* 153* 157* 165*  BUN 28* 17 30* 45* 27*  CREATININE 7.45* 4.67*  7.12* 9.18* 6.35*  CALCIUM 8.5* 8.7* 8.6* 8.7* 8.9  PHOS 4.6 3.1 3.6 3.5 4.1    Liver Function Tests: Recent Labs  Lab 06/01/18 0350 06/02/18 0500 06/03/18 0515 06/04/18 0700 06/05/18 0338  ALBUMIN 2.6* 2.6* 2.7* 2.8* 2.9*   No results for input(s): LIPASE, AMYLASE in the last 168 hours. No results for input(s): AMMONIA in the last 168 hours.  CBC: Recent Labs  Lab 06/01/18 0350 06/02/18 0500 06/03/18 0515 06/04/18 0700 06/05/18 0338  WBC 10.0 11.2* 10.1 9.6 10.1  HGB 9.4* 10.1* 9.5* 9.0* 10.0*  HCT 30.4* 32.4* 30.7* 28.6* 32.6*  MCV 90.2 90.3 90.8 91.4 92.4  PLT 248 254 258 258 255    INR: Recent Labs  Lab 06/01/18 0350 06/02/18 0500 06/03/18 0515 06/04/18 0700 06/05/18 0338  INR 1.43 1.46 1.51 1.64 1.90    Other results:     Imaging   No results found.   Medications:     Scheduled Medications: . sodium chloride   Intravenous Once  . aspirin EC  81 mg Oral Daily  . atorvastatin  40 mg Oral q1800  . calcitRIOL  0.5 mcg Oral Q M,W,F  . Chlorhexidine Gluconate Cloth  6 each Topical Q0600  . citalopram  20 mg Oral Daily  . darbepoetin (ARANESP)  injection - DIALYSIS  40 mcg Intravenous Q Fri-HD  . dronabinol  2.5 mg Oral BID AC  . erythromycin  250 mg Oral TID  . feeding supplement (NEPRO CARB STEADY)  237 mL Oral BID BM  . Influenza vac split quadrivalent PF  0.5 mL Intramuscular Tomorrow-1000  . insulin aspart  5 Units Subcutaneous TID WC  . insulin glargine  10 Units Subcutaneous Daily  . isosorbide-hydrALAZINE  1.5 tablet Oral 3 times per day on Sun Tue Thu Sat  . metoCLOPramide  10 mg Oral TID  . pantoprazole  40 mg Oral BID  . warfarin  5 mg Oral q1800  . Warfarin - Pharmacist Dosing Inpatient   Does not apply q1800    Infusions:   PRN Medications: acetaminophen, docusate sodium, LORazepam, LORazepam, ondansetron (ZOFRAN) IV, promethazine, promethazine   Patient Profile   Quay E McLeanis a 52 y.o.malewith history of CAD  s/p anterior MI in 2010 and NSTEMI in 3/18 with DES to pRCA and mLAD and ischemic cardiomyopathy now s/p Heartmate 3 LVAD and ESRD He was admitted in 10/18 and Heartmate 3 LVAD was placed. He had baseline CKD stage 3 likely from diabetic nephropathy and CHF. He developed peri-op AKI and ended up on dialysis. No renal recovery to date.  Admitted with malfunctioning AVF. Nephrology recommended admit.   Assessment/Plan:    1. ESRD with clotted AV graft: Unable to unclot graft in cath lab.  S/P AV fistula LUE 9/24. Plan for 2nd stage 6 weeks. He has a palpable thrill at AVF site. Currently dialyzing through tunneled HD cath. Nephrology following.   - HD M-W-F.  No change. 2. 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 Volume managed per dialysis. Volume looks good. LDH 167.  - Continue Bidil on non dialysis days, MAP stable.  - Continue asa 81 mg daily.  - On heparin and coumadin, INR 1.90   - Can stop heparin gtt and go home when INR > 1.8.  3. HTN: Stable. Continue Bidil on non-HD days. No change.  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 - Coumadin restarted and on heparin. INR 1.90 . No bleeding on heparin. Discussed dosing with PharmD personally. 5. Diabetic gastroparesis: - Continue reglan 5 mg tid/ac and Marinol.  - Continue Protonix bid.  - Continue eythromycin indefinitely.  - can use Ativan and Phenergan as needed.  No change.  6. LE:XNTZGYFV home regimen. No change.  7. CBS:WHQPRFFM bilateral disease on ABIs in 8/18. Denies claudication currently, no pedal ulcers. No change. 8. Atrial flutter: Paroxysmal. Remains in NSR.  INR 1.90  9. CAD: No CP - Continue statin and asa.  10. Difficult IV access: Would eventually like a port placed. No change.  11. Anemia: In setting of bleeding from catheter site, now resolved.  No overt GI bleeding.  9/23 received 2UPRBCs.  Hgb 10.0 12. Fever: Tmax 99.2. WBC 10.1  Blood CX NGTD.  Now off  vancomycin/Zosyn.   Plan for DC today.   I reviewed the LVAD parameters from today, and compared the results to the patient's prior recorded data.  No programming changes were made.  The LVAD is functioning within specified parameters.  The patient performs LVAD self-test daily.  LVAD interrogation was negative for any significant power changes, alarms or PI events/speed drops.  LVAD equipment check completed and is in good working order.  Back-up equipment present.   LVAD education done on emergency procedures and precautions and reviewed exit site care.  Length of Stay: [place], NP 06/05/2018, 8:20 AM  VAD Team --- VAD ISSUES ONLY--- Pager [phone] (7am - 7am)  Advanced Heart Failure Team  Pager [phone] (M-F; 7a - 4p)  Please contact [facility] for night-coverage after hours (4p -7a ) and weekends on [email or web address]  Patient seen with NP, agree with the above note.  Hgb stable today.  Afebrile, WBCs stable at 10.  He is off abx at this point.   INR 1.9.  He can stop heparin gtt and go home today with close followup in LVAD clinic.   Corbitt Cloke 06/05/2018 10:04 AM

## 2018-06-05 NOTE — Progress Notes (Signed)
LVAD Coordinator Rounding Note:  Direct admitted 05/23/18 from dialysis center. Dialysis center unable to access RUA graft for dialysis treatment.  HM III LVAD implanted on 06/12/17 by Dr. Prescott Gum under Destination Therapy criteria due to renal insufficiency. Pump exchange on 03/01/18 for outflow graft twist/obstruction.   Pt is laying in bed watching TV this morning. He is going to be discharged home today.   Vital signs: Temp: 98.0 HR:  87 Doppler Pressure:  90 Auto BP:  100/57  O2 Sat: 99% on RA Wt: 79.9>79.5>81.3>88.1kg  LVAD interrogation reveals:  Speed: 5500 Flow: 4.3 Power: 4.4w PI: 3.0 Alarms: none  Events:  none Hematocrit: 23 Fixed speed: 5500 Low speed limit: 5200  Drive Line:  Weekly dressing changes. Next dressing change is due 06/07/18.  Labs: LDH trend: 191>195>184>191>166>152>158>150>171>167  INR trend: 2.24>1.84>1.47>1.31>1.18>1.14>1.22>1.43>1.64>1.90  Blood Products: - 05/28/18  2 units PCs     Anticoagulation Plan: - INR Goal: 2.0 - 3.0 - ASA Dose: 81 mg daily  - Heparin gtt until INR therapeutic - per pharmacy  Device: -St Jude dual  -Therapies: on 200 bpm - Monitor: on VT 150 bpm  Adverse Events on VAD: - ESRD post LVAD; started CVVH 06/15/17; HD started 06/20/17 - Hospitalization 11/5 - 07/16/17 > gastroparesis diagnosis after EGD - Hospitalization 11/26 - 08/04/17 > gastroparesis - Hospitalization 1/8 - 09/16/17>gastroparesis - referred to Dr. Corliss Parish at [facility] - 10/31/17>rightupper arm arteriovenous graft with Artegraft; ligation of right first stage brachial vein transposition  - 12/11/13 elective admission for thrombectomy for AV graft in RUA  - 12/20/17 > intractable N/V - 01/19/18 > intractable N/V sent by EMS from dialysis center - 03/01/18 > outflow graft twist/occulsion requiring pump exchange - 05/23/18 > admitted for non-working fistula in RUA   Plan/Recommendations:  1. Please page VAD coordinator with any equipment questions or  concerns. 2. Going home today.    [name] RN [place] Coordinator  Office: [phone]  24/7 Pager: [phone]

## 2018-06-05 NOTE — Progress Notes (Signed)
R CVP triple lumen d/c. Occlusive dressing applied. VS stable, no cp from pt. Discharge instructions given to pt. Answered all questions. Pt d/c home via wheelchair. All belonging taken with pt.

## 2018-06-06 ENCOUNTER — Ambulatory Visit (HOSPITAL_COMMUNITY): Payer: Self-pay

## 2018-06-06 ENCOUNTER — Other Ambulatory Visit: Payer: Self-pay

## 2018-06-06 DIAGNOSIS — N186 End stage renal disease: Secondary | ICD-10-CM

## 2018-06-07 ENCOUNTER — Telehealth (HOSPITAL_COMMUNITY): Payer: Self-pay | Admitting: *Deleted

## 2018-06-07 ENCOUNTER — Ambulatory Visit (INDEPENDENT_AMBULATORY_CARE_PROVIDER_SITE_OTHER): Payer: BLUE CROSS/BLUE SHIELD | Admitting: Family Medicine

## 2018-06-07 ENCOUNTER — Encounter: Payer: Self-pay | Admitting: Family Medicine

## 2018-06-07 ENCOUNTER — Other Ambulatory Visit: Payer: Self-pay

## 2018-06-07 VITALS — BP 102/88 | HR 72 | Temp 98.5°F | Ht 68.0 in | Wt 186.6 lb

## 2018-06-07 DIAGNOSIS — Z794 Long term (current) use of insulin: Secondary | ICD-10-CM

## 2018-06-07 DIAGNOSIS — E118 Type 2 diabetes mellitus with unspecified complications: Secondary | ICD-10-CM | POA: Diagnosis not present

## 2018-06-07 LAB — POCT GLYCOSYLATED HEMOGLOBIN (HGB A1C): HBA1C, POC (CONTROLLED DIABETIC RANGE): 5.8 % (ref 0.0–7.0)

## 2018-06-07 NOTE — Patient Instructions (Signed)
It was very nice meeting you today.  You were seen in clinic for an annual visit.  You do not need a flu shot today as you are ready have this this past week.  I would like you to contact your eye doctor and podiatrist to schedule annual foot and eye exams.  I would like to see you back in 6 months to recheck your A1c.  Please call clinic if you have any questions.  Lovenia Kim MD

## 2018-06-07 NOTE — Telephone Encounter (Signed)
Called pt per Lysbeth Galas, PharmD and asked pt to check home INR either tomorrow or Monday (recent hospital discharge). Pt will check INR tomorrow. He has VAD clinic appt next Thursday, 06/14/18, but unable to draw labs due to difficult access. Pt gets labs drawn at dialysis center through his dialysis catheter.   [place], [place] Coordinatorl [phone]

## 2018-06-07 NOTE — Progress Notes (Signed)
   Subjective:   Patient ID: [name]    DOB: [birth year], 52 y.o. male   MRN: [id number]  CC: physical, meet new PCP  HPI: Quron E Reinhardt is a 52 y.o. male who presents to clinic today with the following issue.  Patient denies current complaints at this time.  He states he is here today to meet new PCP to establish care and for routine exam.    Health maintenance:  -had flu shot this past Monday  -needs to set up an annual eye appointment  -he will set up an appt for foot exam with his podiatrist   ROS: No fevers, chills, nausea, vomiting.  No CP, SOB, palpitations.    Social: Is a never smoker. Medications reviewed. Objective:   BP 102/88   Pulse 72   Temp 98.5 F (36.9 C) (Oral)   Ht 5' 8"  (6.415 m)   Wt 186 lb 9.6 oz (84.6 kg)   SpO2 98%   BMI 28.37 kg/m  Vitals and nursing note reviewed.  General: Pleasant 52 year old male, NAD HEENT: NCAT, EOMI, PERRL, MMM  Neck: supple, no LAD  CV: RRR no MRG  Lungs: CTAB, normal effort  Abdomen: soft, NTND, +bs  Skin: warm, dry, no rash  Extremities: warm and well perfused Neuro: alert, oriented x3, no focal deficits   Assessment & Plan:   52 year old male, presents to establish care with PCP.  He denies any current complaints or concerns. Routine health maintenance issues discussed today.  T2DM Last A1c 09/2017 was 6.1.  Check A1c today  Health maintenance:  -Recent flu shot already given -needs to set up an annual eye appointment  -he will set up an appt for foot exam with his podiatrist   Orders Placed This Encounter  Procedures  . HgB A1c   No orders of the defined types were placed in this encounter.   Lovenia Kim, MD [place], PGY-3 06/18/2018 5:49 PM

## 2018-06-08 ENCOUNTER — Ambulatory Visit (HOSPITAL_COMMUNITY): Payer: Self-pay

## 2018-06-11 ENCOUNTER — Ambulatory Visit (HOSPITAL_COMMUNITY): Payer: Self-pay

## 2018-06-11 ENCOUNTER — Encounter (HOSPITAL_COMMUNITY): Payer: Self-pay

## 2018-06-11 ENCOUNTER — Other Ambulatory Visit (HOSPITAL_COMMUNITY): Payer: Self-pay | Admitting: *Deleted

## 2018-06-11 ENCOUNTER — Other Ambulatory Visit: Payer: Self-pay

## 2018-06-11 ENCOUNTER — Inpatient Hospital Stay (HOSPITAL_COMMUNITY)
Admission: EM | Admit: 2018-06-11 | Discharge: 2018-06-13 | DRG: 073 | Disposition: A | Discharge status: Home / Self Care | Payer: BLUE CROSS/BLUE SHIELD | Attending: Cardiology | Admitting: Cardiology

## 2018-06-11 ENCOUNTER — Emergency Department (HOSPITAL_COMMUNITY): Payer: BLUE CROSS/BLUE SHIELD

## 2018-06-11 DIAGNOSIS — K3184 Gastroparesis: Secondary | ICD-10-CM

## 2018-06-11 DIAGNOSIS — R112 Nausea with vomiting, unspecified: Secondary | ICD-10-CM | POA: Diagnosis present

## 2018-06-11 DIAGNOSIS — G47419 Narcolepsy without cataplexy: Secondary | ICD-10-CM | POA: Diagnosis present

## 2018-06-11 DIAGNOSIS — Z7901 Long term (current) use of anticoagulants: Secondary | ICD-10-CM

## 2018-06-11 DIAGNOSIS — N186 End stage renal disease: Secondary | ICD-10-CM | POA: Diagnosis not present

## 2018-06-11 DIAGNOSIS — I2510 Atherosclerotic heart disease of native coronary artery without angina pectoris: Secondary | ICD-10-CM | POA: Diagnosis not present

## 2018-06-11 DIAGNOSIS — Z794 Long term (current) use of insulin: Secondary | ICD-10-CM

## 2018-06-11 DIAGNOSIS — E1143 Type 2 diabetes mellitus with diabetic autonomic (poly)neuropathy: Principal | ICD-10-CM | POA: Diagnosis present

## 2018-06-11 DIAGNOSIS — E1121 Type 2 diabetes mellitus with diabetic nephropathy: Secondary | ICD-10-CM | POA: Diagnosis present

## 2018-06-11 DIAGNOSIS — Z95811 Presence of heart assist device: Secondary | ICD-10-CM

## 2018-06-11 DIAGNOSIS — I5022 Chronic systolic (congestive) heart failure: Secondary | ICD-10-CM | POA: Diagnosis not present

## 2018-06-11 DIAGNOSIS — I255 Ischemic cardiomyopathy: Secondary | ICD-10-CM | POA: Diagnosis present

## 2018-06-11 DIAGNOSIS — E8889 Other specified metabolic disorders: Secondary | ICD-10-CM | POA: Diagnosis present

## 2018-06-11 DIAGNOSIS — Z992 Dependence on renal dialysis: Secondary | ICD-10-CM

## 2018-06-11 DIAGNOSIS — K219 Gastro-esophageal reflux disease without esophagitis: Secondary | ICD-10-CM | POA: Diagnosis present

## 2018-06-11 DIAGNOSIS — Z833 Family history of diabetes mellitus: Secondary | ICD-10-CM

## 2018-06-11 DIAGNOSIS — Z7982 Long term (current) use of aspirin: Secondary | ICD-10-CM

## 2018-06-11 DIAGNOSIS — Z5181 Encounter for therapeutic drug level monitoring: Secondary | ICD-10-CM

## 2018-06-11 DIAGNOSIS — E785 Hyperlipidemia, unspecified: Secondary | ICD-10-CM | POA: Diagnosis present

## 2018-06-11 DIAGNOSIS — I132 Hypertensive heart and chronic kidney disease with heart failure and with stage 5 chronic kidney disease, or end stage renal disease: Secondary | ICD-10-CM | POA: Diagnosis not present

## 2018-06-11 DIAGNOSIS — Z955 Presence of coronary angioplasty implant and graft: Secondary | ICD-10-CM | POA: Diagnosis not present

## 2018-06-11 DIAGNOSIS — I4892 Unspecified atrial flutter: Secondary | ICD-10-CM | POA: Diagnosis present

## 2018-06-11 DIAGNOSIS — D6851 Activated protein C resistance: Secondary | ICD-10-CM | POA: Diagnosis not present

## 2018-06-11 DIAGNOSIS — D72829 Elevated white blood cell count, unspecified: Secondary | ICD-10-CM | POA: Diagnosis present

## 2018-06-11 DIAGNOSIS — E1122 Type 2 diabetes mellitus with diabetic chronic kidney disease: Secondary | ICD-10-CM | POA: Diagnosis present

## 2018-06-11 DIAGNOSIS — Z86718 Personal history of other venous thrombosis and embolism: Secondary | ICD-10-CM

## 2018-06-11 DIAGNOSIS — I252 Old myocardial infarction: Secondary | ICD-10-CM | POA: Diagnosis not present

## 2018-06-11 DIAGNOSIS — Z79899 Other long term (current) drug therapy: Secondary | ICD-10-CM

## 2018-06-11 DIAGNOSIS — D631 Anemia in chronic kidney disease: Secondary | ICD-10-CM | POA: Diagnosis present

## 2018-06-11 LAB — BASIC METABOLIC PANEL
Anion gap: 17 — ABNORMAL HIGH (ref 5–15)
BUN: 28 mg/dL — AB (ref 6–20)
CO2: 23 mmol/L (ref 22–32)
CREATININE: 6.22 mg/dL — AB (ref 0.61–1.24)
Calcium: 8.7 mg/dL — ABNORMAL LOW (ref 8.9–10.3)
Chloride: 95 mmol/L — ABNORMAL LOW (ref 98–111)
GFR calc non Af Amer: 9 mL/min — ABNORMAL LOW (ref >60)
GFR, EST AFRICAN AMERICAN: 11 mL/min — AB (ref >60)
Glucose, Bld: 114 mg/dL — ABNORMAL HIGH (ref 70–99)
Potassium: 4.2 mmol/L (ref 3.5–5.1)
SODIUM: 135 mmol/L (ref 135–145)

## 2018-06-11 LAB — CBC WITH DIFFERENTIAL/PLATELET
Abs Immature Granulocytes: 0.2 10*3/uL — ABNORMAL HIGH (ref 0.0–0.1)
Basophils Absolute: 0.1 10*3/uL (ref 0.0–0.1)
Basophils Relative: 0 %
EOS ABS: 0 10*3/uL (ref 0.0–0.7)
EOS PCT: 0 %
HEMATOCRIT: 42.9 % (ref 39.0–52.0)
Hemoglobin: 13.6 g/dL (ref 13.0–17.0)
IMMATURE GRANULOCYTES: 1 %
Lymphocytes Relative: 6 %
Lymphs Abs: 1.3 10*3/uL (ref 0.7–4.0)
MCH: 28.9 pg (ref 26.0–34.0)
MCHC: 31.7 g/dL (ref 30.0–36.0)
MCV: 91.3 fL (ref 78.0–100.0)
MONOS PCT: 5 %
Monocytes Absolute: 1.1 10*3/uL — ABNORMAL HIGH (ref 0.1–1.0)
NEUTROS PCT: 88 %
Neutro Abs: 20.2 10*3/uL — ABNORMAL HIGH (ref 1.7–7.7)
PLATELETS: 228 10*3/uL (ref 150–400)
RBC: 4.7 MIL/uL (ref 4.22–5.81)
RDW: 19.1 % — AB (ref 11.5–15.5)
WBC: 22.9 10*3/uL — AB (ref 4.0–10.5)

## 2018-06-11 LAB — GLUCOSE, CAPILLARY
Glucose-Capillary: 104 mg/dL — ABNORMAL HIGH (ref 70–99)
Glucose-Capillary: 82 mg/dL (ref 70–99)

## 2018-06-11 LAB — LACTATE DEHYDROGENASE: LDH: 235 U/L — AB (ref 98–192)

## 2018-06-11 LAB — PROTIME-INR
INR: 2.57
Prothrombin Time: 27.4 seconds — ABNORMAL HIGH (ref 11.4–15.2)

## 2018-06-11 MED ORDER — ACETAMINOPHEN 325 MG PO TABS: 650.0000 mg | ORAL_TABLET | ORAL | Status: DC | PRN

## 2018-06-11 MED ORDER — LORAZEPAM 2 MG/ML IJ SOLN
0.5000 mg | Freq: Once | INTRAMUSCULAR | Status: DC
  Filled 2018-06-11: qty 1

## 2018-06-11 MED ORDER — WARFARIN SODIUM 5 MG PO TABS
5.0000 mg | ORAL_TABLET | Freq: Every day | ORAL | Status: DC
  Administered 2018-06-11: 5 mg via ORAL
  Filled 2018-06-11 (×2): qty 1

## 2018-06-11 MED ORDER — SODIUM CHLORIDE 0.9 % IV SOLN: INTRAVENOUS | Status: DC

## 2018-06-11 MED ORDER — CALCITRIOL 0.25 MCG PO CAPS
0.5000 ug | ORAL_CAPSULE | ORAL | Status: DC
  Administered 2018-06-13: 0.5 ug via ORAL
  Filled 2018-06-11: qty 1
  Filled 2018-06-11: qty 2

## 2018-06-11 MED ORDER — PROMETHAZINE HCL 25 MG/ML IJ SOLN
12.5000 mg | Freq: Once | INTRAMUSCULAR | Status: DC
  Filled 2018-06-11: qty 1

## 2018-06-11 MED ORDER — PROMETHAZINE HCL 25 MG PO TABS: 12.5000 mg | ORAL_TABLET | Freq: Four times a day (QID) | ORAL | Status: DC | PRN

## 2018-06-11 MED ORDER — PANTOPRAZOLE SODIUM 40 MG PO TBEC
40.0000 mg | DELAYED_RELEASE_TABLET | Freq: Two times a day (BID) | ORAL | Status: DC
  Administered 2018-06-11 – 2018-06-13 (×4): 40 mg via ORAL
  Filled 2018-06-11 (×4): qty 1

## 2018-06-11 MED ORDER — PROMETHAZINE HCL 25 MG/ML IJ SOLN
12.5000 mg | Freq: Four times a day (QID) | INTRAMUSCULAR | Status: DC | PRN
  Administered 2018-06-11 – 2018-06-12 (×3): 12.5 mg via INTRAVENOUS
  Filled 2018-06-11 (×2): qty 1

## 2018-06-11 MED ORDER — ONDANSETRON HCL 4 MG/2ML IJ SOLN
4.0000 mg | Freq: Four times a day (QID) | INTRAMUSCULAR | Status: DC | PRN
  Administered 2018-06-12: 4 mg via INTRAVENOUS
  Filled 2018-06-11: qty 2

## 2018-06-11 MED ORDER — NEPRO/CARBSTEADY PO LIQD
237.0000 mL | Freq: Two times a day (BID) | ORAL | Status: DC
  Administered 2018-06-13: 237 mL via ORAL
  Filled 2018-06-11 (×5): qty 237

## 2018-06-11 MED ORDER — INSULIN ASPART 100 UNIT/ML ~~LOC~~ SOLN
3.0000 [IU] | Freq: Three times a day (TID) | SUBCUTANEOUS | Status: DC
  Administered 2018-06-13: 3 [IU] via SUBCUTANEOUS

## 2018-06-11 MED ORDER — LORAZEPAM 2 MG/ML IJ SOLN
1.0000 mg | Freq: Once | INTRAMUSCULAR | Status: AC
  Administered 2018-06-11: 1 mg via INTRAMUSCULAR
  Filled 2018-06-11: qty 1

## 2018-06-11 MED ORDER — ERYTHROMYCIN BASE 250 MG PO TABS
250.0000 mg | ORAL_TABLET | Freq: Three times a day (TID) | ORAL | Status: DC
  Administered 2018-06-11 – 2018-06-13 (×5): 250 mg via ORAL
  Filled 2018-06-11 (×8): qty 1

## 2018-06-11 MED ORDER — LORAZEPAM 2 MG/ML IJ SOLN
0.5000 mg | Freq: Three times a day (TID) | INTRAMUSCULAR | Status: DC | PRN
  Administered 2018-06-11 – 2018-06-12 (×3): 0.5 mg via INTRAVENOUS
  Filled 2018-06-11 (×2): qty 1

## 2018-06-11 MED ORDER — ISOSORB DINITRATE-HYDRALAZINE 20-37.5 MG PO TABS
1.5000 | ORAL_TABLET | ORAL | Status: DC
  Administered 2018-06-12 (×3): 1.5 via ORAL
  Filled 2018-06-11 (×3): qty 2

## 2018-06-11 MED ORDER — DOCUSATE SODIUM 100 MG PO CAPS: 100.0000 mg | ORAL_CAPSULE | Freq: Two times a day (BID) | ORAL | Status: DC | PRN

## 2018-06-11 MED ORDER — CITALOPRAM HYDROBROMIDE 20 MG PO TABS
20.0000 mg | ORAL_TABLET | Freq: Every day | ORAL | Status: DC
  Administered 2018-06-12 – 2018-06-13 (×2): 20 mg via ORAL
  Filled 2018-06-11 (×2): qty 1

## 2018-06-11 MED ORDER — HYDRALAZINE HCL 20 MG/ML IJ SOLN
10.0000 mg | INTRAMUSCULAR | Status: DC | PRN
  Administered 2018-06-11: 10 mg via INTRAVENOUS
  Filled 2018-06-11: qty 1

## 2018-06-11 MED ORDER — INSULIN ASPART 100 UNIT/ML ~~LOC~~ SOLN: 0.0000 [IU] | Freq: Every day | SUBCUTANEOUS | Status: DC

## 2018-06-11 MED ORDER — ATORVASTATIN CALCIUM 40 MG PO TABS
40.0000 mg | ORAL_TABLET | Freq: Every day | ORAL | Status: DC
  Administered 2018-06-11 – 2018-06-12 (×2): 40 mg via ORAL
  Filled 2018-06-11 (×2): qty 1

## 2018-06-11 MED ORDER — WARFARIN - PHARMACIST DOSING INPATIENT: Freq: Every day | Status: DC

## 2018-06-11 MED ORDER — METOCLOPRAMIDE HCL 10 MG PO TABS
10.0000 mg | ORAL_TABLET | Freq: Three times a day (TID) | ORAL | Status: DC
  Administered 2018-06-11 – 2018-06-13 (×5): 10 mg via ORAL
  Filled 2018-06-11 (×6): qty 1

## 2018-06-11 MED ORDER — ASPIRIN EC 81 MG PO TBEC
81.0000 mg | DELAYED_RELEASE_TABLET | Freq: Every day | ORAL | Status: DC
  Administered 2018-06-12 – 2018-06-13 (×2): 81 mg via ORAL
  Filled 2018-06-11 (×2): qty 1

## 2018-06-11 MED ORDER — RENA-VITE PO TABS
1.0000 | ORAL_TABLET | Freq: Every day | ORAL | Status: DC
  Administered 2018-06-12 – 2018-06-13 (×2): 1 via ORAL
  Filled 2018-06-11 (×3): qty 1

## 2018-06-11 MED ORDER — DRONABINOL 2.5 MG PO CAPS
2.5000 mg | ORAL_CAPSULE | Freq: Two times a day (BID) | ORAL | Status: DC
  Administered 2018-06-12 – 2018-06-13 (×3): 2.5 mg via ORAL
  Filled 2018-06-11 (×4): qty 1

## 2018-06-11 MED ORDER — LORAZEPAM 0.5 MG PO TABS: 0.5000 mg | ORAL_TABLET | Freq: Two times a day (BID) | ORAL | Status: DC | PRN

## 2018-06-11 MED ORDER — INSULIN ASPART 100 UNIT/ML ~~LOC~~ SOLN
0.0000 [IU] | Freq: Three times a day (TID) | SUBCUTANEOUS | Status: DC
  Administered 2018-06-12: 1 [IU] via SUBCUTANEOUS
  Administered 2018-06-13: 2 [IU] via SUBCUTANEOUS

## 2018-06-11 MED ORDER — HYDRALAZINE HCL 20 MG/ML IJ SOLN: 10.0000 mg | Freq: Four times a day (QID) | INTRAMUSCULAR | Status: DC | PRN

## 2018-06-11 MED ORDER — PROMETHAZINE HCL 25 MG/ML IJ SOLN
25.0000 mg | Freq: Once | INTRAMUSCULAR | Status: AC
  Administered 2018-06-11: 25 mg via INTRAMUSCULAR
  Filled 2018-06-11: qty 1

## 2018-06-11 MED ORDER — ONDANSETRON 4 MG PO TBDP: 4.0000 mg | ORAL_TABLET | Freq: Four times a day (QID) | ORAL | Status: DC | PRN

## 2018-06-11 NOTE — Progress Notes (Signed)
WBC came back 22.9. Tmax 99.1. VAD dressing changed with no s/s infection by VAD coordinator. Check CXR and blood cultures. Will admit for further work up to [facility]. Will ask ED MD to place central line. Discussed with Dr Aundra Dubin.  Georgiana Shore, NP

## 2018-06-11 NOTE — ED Provider Notes (Signed)
[facility] EMERGENCY DEPARTMENT Provider Note   CSN: [id number] Arrival date & time: 06/11/18  1204     History   Chief Complaint No chief complaint on file.   HPI [name] is a 52 y.o. male.   HPI   He presents for evaluation of ongoing nausea without vomiting for several days.  He has been using oral Phenergan and Ativan, his usual medications, without relief.  The patient has an LVAD currently.  He denies fever, chills, shortness of breath, focal weakness or paresthesia.  He did his usual dialysis this morning.  There are no other known modifying factors or complaints.  Past Medical History:  Diagnosis Date  . Angina   . ASCVD (arteriosclerotic cardiovascular disease)    , Anterior infarction 2005, LAD diagonal bifurcation intervention 03/2004  . Automatic implantable cardiac defibrillator -St. Jude's       . Benign neoplasm of colon   . CHF (congestive heart failure) ([place])   . Chronic systolic heart failure ([place])   . Coronary artery disease     Widely patent previously placed stents in the left anterior   . Crohn's disease ([place])   . 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 Dialysis patient ([place])   . Dyspnea   . Gastroparesis   . GERD (gastroesophageal reflux disease)   . High cholesterol   . Hyperlipidemia   . Hypersomnolent    Previous diagnosis of narcolepsy  . Hypertension, essential   . Ischemic cardiomyopathy    Ejection fraction 15-20% catheterization 2010  . Type II or unspecified type diabetes mellitus without mention of complication, not stated as uncontrolled   . Unspecified gastritis and gastroduodenitis without mention of hemorrhage     Patient Active Problem List   Diagnosis Date Noted  . Nausea & vomiting 06/11/2018  . Heart failure ([place]) 05/23/2018  . Hypertensive urgency 03/25/2018  . Benign essential HTN   . Crohn's disease with complication ([place])   . Diabetes mellitus type 2 in nonobese (HCC)    . Anemia of chronic disease   . Sinus tachycardia   . Leukocytosis   . Left ventricular assist device (LVAD) complication 18/56/3149  . Left ventricular assist device (LVAD) complication, initial encounter 02/28/2018  . ESRD (end stage renal disease) ([place]) 12/11/2017  . Left ventricular assist device present ([place]) 12/05/2017  . ESRD (end stage renal disease) on dialysis ([place]) 10/29/2017  . Nausea with vomiting 09/12/2017  . Intractable nausea and vomiting 07/31/2017  . 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 LVAD (left ventricular assist device) present ([place])   . Palliative care by specialist   . Chronic systolic heart failure ([place]) 06/07/2017  . ACP (advance care planning)   . Encounter for central line placement   . Diarrhea   . Palliative care encounter   . Nausea   . CHF exacerbation ([place]) 04/05/2017  . Diabetic keto-acidosis ([place]) 04/05/2017  . Type 2 diabetes mellitus with complication, with long-term current use of insulin ([place]) 03/08/2017  . ESRD on dialysis ([place]) 01/17/2017  . AKI (acute kidney injury) ([place])   . Non-ST elevation (NSTEMI) myocardial infarction ([place])   . CHF (congestive heart failure) ([place]) 09/02/2016  . Elevated serum creatinine   . Encounter for therapeutic drug monitoring 08/03/2016  . Chest pain 04/26/2012  . SVT (supraventricular tachycardia) ([place]) 04/26/2012  . Gastroparesis due to DM ([place]) 11/18/2011  . Nausea and vomiting in adult 11/17/2011  .  Coronary artery disease   . Ischemic cardiomyopathy   . Essential hypertension   . Automatic implantable cardioverter-defibrillator in situ   . Deep venous thrombosis ([place])   . Hypersomnolent   . POLYP, COLON 09/25/2008  . Type 2 diabetes mellitus ([place]) 09/25/2008  . Dyslipidemia, goal LDL below 70 09/25/2008  . GASTRITIS 09/25/2008  . DIVERTICULOSIS, COLON 09/25/2008  . RECTAL MASS 09/25/2008    Past Surgical History:  Procedure Laterality Date  . A/V SHUNTOGRAM  N/A 05/24/2018   Procedure: A/V SHUNTOGRAM;  Surgeon: Marty Heck, MD;  Location: [facility];  Service: Cardiovascular;  Laterality: N/A;  . APPENDECTOMY    . AV FISTULA PLACEMENT Right 06/26/2017   Procedure: ARTERIOVENOUS (AV) FISTULA CREATION VERSUS GRAFT RIGHT  ARM;  Surgeon: Conrad [place], MD;  Location: [facility];  Service: Vascular;  Laterality: Right;  . AV FISTULA PLACEMENT Right 10/31/2017   Procedure: INSERTION OF ARTERIOVENOUS (AV) ARTEGRAFT,  RIGHT UPPER ARM;  Surgeon: Conrad Beechwood, MD;  Location: [facility];  Service: Vascular;  Laterality: Right;  . AV FISTULA PLACEMENT Left 05/29/2018   Procedure: ARTERIOVENOUS (AV) FISTULA CREATION  LEFT UPPER EXTREMITY;  Surgeon: Waynetta Sandy, MD;  Location: [facility];  Service: Vascular;  Laterality: Left;  . CARDIAC DEFIBRILLATOR PLACEMENT  2010   St. Jude ICD  . CENTRAL LINE INSERTION Right 05/24/2018   Procedure: CENTRAL LINE INSERTION;  Surgeon: Marty Heck, MD;  Location: [facility];  Service: Cardiovascular;  Laterality: Right;  . COLECTOMY  ~ 2003   "for Crohn's" ascending colon.    . CORONARY STENT INTERVENTION N/A 11/25/2016   Procedure: Coronary Stent Intervention;  Surgeon: Leonie Man, MD;  Location: [facility];  Service: Cardiovascular;  Laterality: N/A;  . EP IMPLANTABLE DEVICE N/A 09/14/2016   Procedure: ICD Generator Changeout;  Surgeon: Deboraha Sprang, MD;  Location: [facility] CV [facility];  Service: Cardiovascular;  Laterality: N/A;  . ESOPHAGOGASTRODUODENOSCOPY N/A 07/12/2017   Procedure: ESOPHAGOGASTRODUODENOSCOPY (EGD);  Surgeon: Jerene Bears, MD;  Location: [facility] ENDOSCOPY;  Service: Gastroenterology;  Laterality: N/A;  VAD pt so VAD team will need to accompany pt.   [name] FETAL SURGERY FOR CONGENITAL HERNIA     ????? pt has no knowledge of this..    . INGUINAL HERNIA REPAIR    . INSERTION OF DIALYSIS CATHETER Right 06/26/2017   Procedure: INSERTION OF TUNNELED  DIALYSIS CATHETER RIGHT  INTERNAL JUGULAR;  Surgeon: Conrad Shandon, MD;  Location: [facility];  Service: Vascular;  Laterality: Right;  . INSERTION OF IMPLANTABLE LEFT VENTRICULAR ASSIST DEVICE N/A 06/12/2017   Procedure: INSERTION OF IMPLANTABLE LEFT VENTRICULAR ASSIST [place] 3;  Surgeon: Ivin Poot, MD;  Location: [facility];  Service: Open Heart Surgery;  Laterality: N/A;  HM3 LVAD  CIRC ARREST  NITRIC OXIDE  . INSERTION OF IMPLANTABLE LEFT VENTRICULAR ASSIST DEVICE N/A 02/28/2018   Procedure: REDO INSERTION OF IMPLANTABLE LEFT VENTRICULAR ASSIST DEVICE - HEARTMATE 3;  Surgeon: Ivin Poot, MD;  Location: [facility];  Service: Open Heart Surgery;  Laterality: N/A;  . IR REMOVAL TUN CV CATH W/O FL  02/26/2018  . IR THORACENTESIS ASP PLEURAL SPACE W/IMG GUIDE  06/28/2017  . LIGATION OF ARTERIOVENOUS  FISTULA Right 10/31/2017   Procedure: LIGATION OF BRACHIO-BASILIC VEIN TRANSPOSITION RIGHT ARM;  Surgeon: Conrad Spillville, MD;  Location: [facility];  Service: Vascular;  Laterality: Right;  . MULTIPLE EXTRACTIONS WITH ALVEOLOPLASTY N/A 06/08/2017   Procedure: MULTIPLE EXTRACTION  WITH ALVEOLOPLASTY AND PRE PROSTHETIC SURGERY AS NEEDED;  Surgeon: Lenn Cal, DDS;  Location: [facility];  Service: Oral Surgery;  Laterality: N/A;  . RIGHT HEART CATH N/A 06/07/2017   Procedure: RIGHT HEART CATH;  Surgeon: Larey Dresser, MD;  Location: [facility];  Service: Cardiovascular;  Laterality: N/A;  . RIGHT HEART CATH N/A 02/08/2018   Procedure: RIGHT HEART CATH;  Surgeon: Larey Dresser, MD;  Location: [facility];  Service: Cardiovascular;  Laterality: N/A;  . RIGHT/LEFT HEART CATH AND CORONARY ANGIOGRAPHY N/A 11/23/2016   Procedure: Right/Left Heart Cath and Coronary Angiography;  Surgeon: Larey Dresser, MD;  Location: [facility];  Service: Cardiovascular;  Laterality: N/A;  . TEE WITHOUT CARDIOVERSION N/A 06/12/2017   Procedure: TRANSESOPHAGEAL ECHOCARDIOGRAM (TEE);  Surgeon: Prescott Gum, Collier Salina, MD;  Location:  [facility];  Service: Open Heart Surgery;  Laterality: N/A;  . TEE WITHOUT CARDIOVERSION N/A 02/28/2018   Procedure: TRANSESOPHAGEAL ECHOCARDIOGRAM (TEE);  Surgeon: Prescott Gum, Collier Salina, MD;  Location: [facility];  Service: Open Heart Surgery;  Laterality: N/A;  . TEMPORARY DIALYSIS CATHETER Left 05/24/2018   Procedure: TEMPORARY DIALYSIS CATHETER;  Surgeon: Marty Heck, MD;  Location: [facility];  Service: Cardiovascular;  Laterality: Left;  . THROMBECTOMY AND REVISION OF ARTERIOVENTOUS (AV) GORETEX  GRAFT Right 12/12/2017   Procedure: THROMBECTOMY ARTERIOVENTOUS (AV) GORETEX  GRAFT RIGHT UPPER ARM;  Surgeon: Conrad Summerhill, MD;  Location: [facility];  Service: Vascular;  Laterality: Right;        Home Medications    Prior to Admission medications   Medication Sig Start Date End Date Taking? Authorizing Provider  aspirin EC 81 MG tablet Take 1 tablet (81 mg total) by mouth daily. 07/26/17  Yes Larey Dresser, MD  atorvastatin (LIPITOR) 40 MG tablet Take 1 tablet (40 mg total) by mouth daily at 6 PM. 01/25/18  Yes [name], MD  calcitRIOL (ROCALTROL) 0.5 MCG capsule Take 1 capsule (0.5 mcg total) by mouth every Monday, Wednesday, and Friday. 06/06/18  Yes Georgiana Shore, NP  citalopram (CELEXA) 20 MG tablet Take 1 tablet (20 mg total) by mouth daily. 03/19/18  Yes Georgiana Shore, NP  dronabinol (MARINOL) 2.5 MG capsule Take 1 capsule (2.5 mg total) by mouth 2 (two) times daily before lunch and supper. 03/27/18  Yes Larey Dresser, MD  erythromycin (E-MYCIN) 250 MG tablet Take 1 tablet (250 mg total) by mouth 3 (three) times daily. 03/25/18  Yes Isaiah Serge, NP  insulin aspart (NOVOLOG) 100 UNIT/ML injection Inject 5 Units into the skin 3 (three) times daily with meals. 01/22/18  Yes Georgiana Shore, NP  insulin glargine (LANTUS) 100 UNIT/ML injection Inject 0.1 mLs (10 Units total) into the skin daily. 05/09/18  Yes Lovenia Kim, MD  isosorbide-hydrALAZINE (BIDIL) 20-37.5 MG  tablet Take 1.5 tablets by mouth every 8 (eight) hours. Take 1.5 tablets every 8 hours on non dialysis days. Patient taking differently: Take 1.5 tablets by mouth See admin instructions. Take 1.5 tablets every 8 hours on non dialysis days. 03/19/18  Yes Georgiana Shore, NP  LORazepam (ATIVAN) 0.5 MG tablet Take 1 tablet (0.5 mg total) by mouth every 12 (twelve) hours as needed (nausea). 03/27/18  Yes Larey Dresser, MD  metoCLOPramide (REGLAN) 10 MG tablet Take 1 tablet (10 mg total) by mouth 3 (three) times daily. 01/22/18  Yes Georgiana Shore, NP  multivitamin (RENA-VIT) TABS tablet Take 1 tablet by  mouth daily.   Yes [provider]  ondansetron (ZOFRAN ODT) 4 MG disintegrating tablet Take 1 tablet (4 mg total) by mouth every 6 (six) hours as needed for nausea or vomiting. 01/10/17  Yes Esterwood, Amy S, PA-C  pantoprazole (PROTONIX) 40 MG tablet Take 40 mg by mouth 2 (two) times daily.    Yes [provider]  promethazine (PHENERGAN) 12.5 MG tablet Take 1 tablet (12.5 mg total) by mouth every 6 (six) hours as needed for nausea or vomiting. 09/16/17  Yes Daune Perch, NP  warfarin (COUMADIN) 5 MG tablet Take 1 tablet (5 mg total) by mouth daily at 6 PM. 03/25/18  Yes Isaiah Serge, NP    Family History Family History  Problem Relation Age of Onset  . Colon polyps Mother   . Diabetes Mother   . Heart attack Father   . Diabetes Father   . Stroke Paternal Grandfather   . Colitis Sister   . Heart disease Brother   . Colitis Sister   . Colon cancer Neg Hx     Social History Social History   Tobacco Use  . Smoking status: Never Smoker  . Smokeless tobacco: Never Used  Substance Use Topics  . Alcohol use: No  . Drug use: No     Allergies   Metformin and related   Review of Systems Review of Systems  All other systems reviewed and are negative.    Physical Exam Updated Vital Signs BP 130/90 (BP Location: Right Arm)   Pulse 92   Temp 98.4 F (36.9 C)  (Rectal)   Resp 18   Ht 5' 8"  (1.727 m)   Wt 79 kg   SpO2 97%   BMI 26.48 kg/m   Physical Exam  Constitutional: He is oriented to person, place, and time. He appears well-developed and well-nourished.  HENT:  Head: Normocephalic and atraumatic.  Right Ear: External ear normal.  Left Ear: External ear normal.  Eyes: Pupils are equal, round, and reactive to light. Conjunctivae and EOM are normal.  Neck: Normal range of motion and phonation normal. Neck supple.  Cardiovascular: Normal rate, regular rhythm and normal heart sounds.  Typical LVAD appliance, and site without apparent complication.  Pulmonary/Chest: Effort normal and breath sounds normal. He exhibits no bony tenderness.  Abdominal: Soft. There is no tenderness.  Musculoskeletal: Normal range of motion.  Neurological: He is alert and oriented to person, place, and time. No cranial nerve deficit or sensory deficit. He exhibits normal muscle tone. Coordination normal.  Skin: Skin is warm, dry and intact.  Psychiatric: He has a normal mood and affect. His behavior is normal. Judgment and thought content normal.  Nursing note and vitals reviewed.    ED Treatments / Results  Labs (all labs ordered are listed, but only abnormal results are displayed) Labs Reviewed  BASIC METABOLIC PANEL - Abnormal; Notable for the following components:      Result Value   Chloride 95 (*)    Glucose, Bld 114 (*)    BUN 28 (*)    Creatinine, Ser 6.22 (*)    Calcium 8.7 (*)    GFR calc non Af Amer 9 (*)    GFR calc Af Amer 11 (*)    Anion gap 17 (*)    All other components within normal limits  CBC WITH DIFFERENTIAL/PLATELET - Abnormal; Notable for the following components:   WBC 22.9 (*)    RDW 19.1 (*)    Neutro Abs 20.2 (*)  Monocytes Absolute 1.1 (*)    Abs Immature Granulocytes 0.2 (*)    All other components within normal limits  PROTIME-INR - Abnormal; Notable for the following components:   Prothrombin Time 27.4 (*)    All  other components within normal limits  LACTATE DEHYDROGENASE - Abnormal; Notable for the following components:   LDH 235 (*)    All other components within normal limits  CULTURE, BLOOD (ROUTINE X 2)  CULTURE, BLOOD (ROUTINE X 2)  PROTIME-INR  LACTATE DEHYDROGENASE    EKG None  Radiology Dg Chest Portable 1 View  Result Date: 06/11/2018 CLINICAL DATA:  Leukocytosis. Abdominal pain and vomiting. EXAM: PORTABLE CHEST 1 VIEW COMPARISON:  05/27/2018. FINDINGS: Stable enlarged cardiac silhouette, coronary artery stents, LVAD, left subclavian pacer and AICD leads and left jugular double-lumen catheter with its tip added just above the superior cavoatrial junction. Clear lungs with normal vascularity. Unremarkable bones. IMPRESSION: No acute abnormality.  Stable cardiomegaly. Electronically Signed   By: Claudie Revering M.D.   On: 06/11/2018 15:22    Procedures Procedures (including critical care time)  Medications Ordered in ED Medications  0.9 %  sodium chloride infusion (has no administration in time range)  promethazine (PHENERGAN) injection 12.5 mg (has no administration in time range)  LORazepam (ATIVAN) injection 0.5 mg (has no administration in time range)  warfarin (COUMADIN) tablet 5 mg (has no administration in time range)  Warfarin - Pharmacist Dosing Inpatient (has no administration in time range)  aspirin EC tablet 81 mg (has no administration in time range)  erythromycin (E-MYCIN) tablet 250 mg (has no administration in time range)  atorvastatin (LIPITOR) tablet 40 mg (has no administration in time range)  isosorbide-hydrALAZINE (BIDIL) 20-37.5 MG per tablet 1.5 tablet (has no administration in time range)  citalopram (CELEXA) tablet 20 mg (has no administration in time range)  LORazepam (ATIVAN) tablet 0.5 mg (has no administration in time range)  calcitRIOL (ROCALTROL) capsule 0.5 mcg (has no administration in time range)  docusate sodium (COLACE) capsule 100 mg (has no  administration in time range)  dronabinol (MARINOL) capsule 2.5 mg (has no administration in time range)  metoCLOPramide (REGLAN) tablet 10 mg (has no administration in time range)  ondansetron (ZOFRAN-ODT) disintegrating tablet 4 mg (has no administration in time range)  pantoprazole (PROTONIX) EC tablet 40 mg (has no administration in time range)  multivitamin (RENA-VIT) tablet 1 tablet (has no administration in time range)  feeding supplement (NEPRO CARB STEADY) liquid 237 mL (has no administration in time range)  promethazine (PHENERGAN) tablet 12.5 mg (has no administration in time range)  acetaminophen (TYLENOL) tablet 650 mg (has no administration in time range)  ondansetron (ZOFRAN) injection 4 mg (has no administration in time range)  insulin aspart (novoLOG) injection 0-9 Units (has no administration in time range)  insulin aspart (novoLOG) injection 0-5 Units (has no administration in time range)  insulin aspart (novoLOG) injection 3 Units (has no administration in time range)  promethazine (PHENERGAN) injection 25 mg (25 mg Intramuscular Given 06/11/18 1315)  LORazepam (ATIVAN) injection 1 mg (1 mg Intramuscular Given 06/11/18 1315)     Initial Impression / Assessment and Plan / ED Course  I have reviewed the triage vital signs and the nursing notes.  Pertinent labs & imaging results that were available during my care of the patient were reviewed by me and considered in my medical decision making (see chart for details).  Clinical Course as of Jun 11 1621  [facility] Jun 11, 2018  1551  Normal  Protime-INR(!) [EW]  1551 Normal except chloride low, glucose high, BUN high, creatinine high, calcium low, GFR low  Basic metabolic panel(!) [EW]  4158 High  Lactate dehydrogenase(!) [EW]  1552 Normal except white count high   CBC with Differential(!) [EW]  1552 No acute abnormality, images reviewed by me  DG Chest Portable 1 View [EW]    Clinical Course User Index [EW] Daleen Bo,  MD     Patient Vitals for the past 24 hrs:  BP Temp Temp src Pulse Resp SpO2 Height Weight  06/11/18 1550 130/90 - - 92 18 97 % - -  06/11/18 1548 130/90 - - 93 - 97 % - -  06/11/18 1530 - - - 95 14 97 % - -  06/11/18 1510 - 98.4 F (36.9 C) Rectal - - - - -  06/11/18 1500 - - - 96 14 100 % - -  06/11/18 1400 - - - 89 20 98 % - -  06/11/18 1300 (!) 137/93 - - 91 19 98 % - -  06/11/18 1245 (!) 157/102 - - 92 14 97 % - -  06/11/18 1216 (!) 140/96 99.1 F (37.3 C) Oral 97 18 98 % - -  06/11/18 1215 (!) 140/96 - - 94 20 97 % - -  06/11/18 1212 - - - - - - 5' 8"  (1.727 m) 79 kg  06/11/18 1210 - - - - - 99 % - -    4:22 PM Reevaluation with update and discussion. After initial assessment and treatment, an updated evaluation reveals patient has been able to tolerate oral applesauce, and some oral liquid.  He is comfortable at this time.  Patient has been managed by cardiology services in the emergency department.  They have arranged for admission.  IV therapy was able to get a ultrasound-guided peripheral access line in the right forearm. Daleen Bo   Medical Decision Making: Recurrent nausea and vomiting with reported history of gastroparesis however not documented in the patient's chart.  She was treated with IM medication with improvement.  He was dialyzed today.  There is no indication for unstable hemodynamic status, significant infection or metabolic instability.  Nonspecific white blood count elevation.  No overt evidence for infection.  LVAD patient with stable cardiac parameters.  CRITICAL CARE-no Performed by: Daleen Bo  Nursing Notes Reviewed/ Care Coordinated Applicable Imaging Reviewed Interpretation of Laboratory Data incorporated into ED treatment   Plan-cardiology admission, LVAD patient with nausea and vomiting, which is recurrent.  Final Clinical Impressions(s) / ED Diagnoses   Final diagnoses:  Nausea and vomiting, intractability of vomiting not specified,  unspecified vomiting type    ED Discharge Orders    None       Daleen Bo, MD 06/11/18 1624

## 2018-06-11 NOTE — Progress Notes (Signed)
ANTICOAGULATION CONSULT NOTE - Initial Consult  Pharmacy Consult for coumadin Indication: LVAD, recurrent DVT, Factor V Leiden heterozygote  Allergies  Allergen Reactions  . Metformin And Related Diarrhea    Patient Measurements: Height: 5' 8"  (172.7 cm) Weight: 174 lb 2.6 oz (79 kg) IBW/kg (Calculated) : 68.4  Vital Signs: Temp: 99.1 F (37.3 C) (10/07 1216) Temp Source: Oral (10/07 1216) BP: 137/93 (10/07 1300) Pulse Rate: 91 (10/07 1300)  Labs: Recent Labs    06/11/18 1242 06/11/18 1303  HGB 13.6  --   HCT 42.9  --   PLT 228  --   LABPROT  --  27.4*  INR  --  2.57  CREATININE 6.22*  --     Estimated Creatinine Clearance: 13.3 mL/min (A) (by C-G formula based on SCr of 6.22 mg/dL (H)).   Medical History: Past Medical History:  Diagnosis Date  . Angina   . ASCVD (arteriosclerotic cardiovascular disease)    , Anterior infarction 2005, LAD diagonal bifurcation intervention 03/2004  . Automatic implantable cardiac defibrillator -St. Jude's       . Benign neoplasm of colon   . CHF (congestive heart failure) ([place])   . Chronic systolic heart failure ([place])   . Coronary artery disease     Widely patent previously placed stents in the left anterior   . Crohn's disease ([place])   . 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 Dialysis patient ([place])   . Dyspnea   . Gastroparesis   . GERD (gastroesophageal reflux disease)   . High cholesterol   . Hyperlipidemia   . Hypersomnolent    Previous diagnosis of narcolepsy  . Hypertension, essential   . Ischemic cardiomyopathy    Ejection fraction 15-20% catheterization 2010  . Type II or unspecified type diabetes mellitus without mention of complication, not stated as uncontrolled   . Unspecified gastritis and gastroduodenitis without mention of hemorrhage     Assessment: [name] is a 13y old male, [facility] ED for evaluation of continued N/V since 10/5. Pharmacy to dose coumadin for LVAD,  recurrent DVT, Factor V Leiden heterozygote. INR Goal 2-3. PTA warfarin regimen 23m daily. INR 2.57 on admit this afternoon.   Goal of Therapy:  INR 2-3 Monitor platelets by anticoagulation protocol: Yes   Plan:  Continue PTA coumadin 554mdaily. Monitor daily INR, CBC, s/sx of bleeding  Thank you for involving pharmacy in this patient's care.  DyJanae BridgemanPharmD PGY1 Pharmacy Resident Phone: [phone]/03/2018 2:54 PM

## 2018-06-11 NOTE — ED Notes (Signed)
Pt at bedside eating applesauce.

## 2018-06-11 NOTE — Progress Notes (Signed)
LVAD Coordinator ED Encounter  [name] a 52 y.o. male that presented to [facility] ER today by EMS from dialysis for N/V and weakness. He has a past medical history  has a past medical history of Angina, ASCVD (arteriosclerotic cardiovascular disease), Automatic implantable cardiac defibrillator -St. Jude's, Benign neoplasm of colon, CHF (congestive heart failure) ([place]), Chronic systolic heart failure ([place]), Coronary artery disease, Crohn's disease ([place]), Deep venous thrombosis ([place]), Dialysis patient ([place]), Dyspnea, Gastroparesis, GERD (gastroesophageal reflux disease), High cholesterol, Hyperlipidemia, Hypersomnolent, Hypertension, essential, Ischemic cardiomyopathy, Type II or unspecified type diabetes mellitus without mention of complication, not stated as uncontrolled, and Unspecified gastritis and gastroduodenitis without mention of hemorrhage.[name]   LVAD is a HM3 and was implanted on 03/01/18 by PVT for twisted outflow graft on previous pump.    Vital signs: HR: 92 Doppler MAP:  Automated BP: 157/102 O2 Sat: 97  LVAD interrogation reveals:  Speed: 5500 Flow: 3.6 Power:  4.8 PI: 8.0  Alarms: none Events: 5 PI today  Drive Line: due to be change Thursday but changed today due to WBC of 23. Existing VAD dressing removed and site care performed using sterile technique. Drive line exit site cleaned with Chlora prep applicators x 2, allowed to dry, and Sorbaview dressing with bio patch re-applied. Exit site healed and incorporated, the velour is fully implanted at exit site. No redness, tenderness, drainage, foul odor or rash noted. Drive line anchor re-applied. Next change due 06/18/18.  Significant Events with LVAD: - ESRD post LVAD; started CVVH 06/15/17; HD started 06/20/17 - Hospitalization 11/5 - 07/16/17 > gastroparesis diagnosis after EGD - Hospitalization 11/26 - 08/04/17 > gastroparesis - Hospitalization 1/8 - 09/16/17>gastroparesis - referred to Dr. Corliss Parish at [facility] -  10/31/17>rightupper arm arteriovenous graft with Artegraft; ligation of right first stage brachial vein transposition  - 12/11/13 elective admission for thrombectomy for AV graft in RUA  - 12/20/17 > intractable N/V - 01/19/18 > intractable N/V sent by EMS from dialysis center - 03/01/18 > outflow graft twist/occulsion requiring pump exchange - 05/23/18 > admitted for non-working fistula in RUA  Updated VAD Providers Lillia Mountain and Dr. Aundra Dubin about the above. No LVAD issues and pump is functioning as expected. Able to independently manage LVAD equipment. No LVAD needs at this time.    [name], RN VAD Coordinator   Office: [phone] 24/7 Emergency VAD Pager: [phone]

## 2018-06-11 NOTE — ED Triage Notes (Signed)
Pt arrived via [facility] from dialysis c/o weakness and nausea x2 days.  Dialysis reports pt got full treatment -20 minutes and 4K wash, pt 1 kilo under dry weight.

## 2018-06-11 NOTE — H&P (Addendum)
Advanced Heart Failure VAD History and Physical Note   PCP-Cardiologist: Dr Aundra Dubin  Reason for Admission: Nausea and vomiting  HPI:    [name] a 52 y.o.malewith history of CAD s/p anterior MI in 2010 and NSTEMI in 3/18 with DES to pRCA and mLAD and ischemic cardiomyopathy now s/p Heartmate 3 LVAD and ESRDHe was admitted in 10/18 and Heartmate 3 LVAD was placed. He had baseline CKD stage 3 likely from diabetic nephropathy and CHF. He developed peri-op AKI and ended up on dialysis. No renal recovery to date.  He was recently admitted 9/18-10/1/19 with clotted AV graft. Unable to fix clotted AV graft and underwent new AVF placement on 9/24. Tunneled HD cath in place for HD currently.   He has been having problems with nausea and vomiting since Saturday. He has been unable to keep anything down. Last meal was on Friday. No fever or body aches. He got his flu shot Tuesday. He has been taking phenergan and ativan with minimal relief. He also had diarrhea on Saturday. No VAD alarms. No cough, SOB, or orthopnea. No dizziness. No bleeding on coumadin. He has only taken him coumadin, ativan, and phenergan since Friday. Went for HD today and had all but 20 minutes of treatment. No fluid pulled off.  Labs pending.  LVAD INTERROGATION:  HeartMate II LVAD:  Flow 3.6 liters/min, speed 5500, power 4.6, PI 8.0.  4 PI events in 24 hours.   Review of Systems: [y] = yes, [ ]  = no   General: Weight gain [ ] ; Weight loss [ ] ; Anorexia Blue.Reese ]; Fatigue [ ] ; Fever [ ] ; Chills [ ] ; Weakness Blue.Reese ]  Cardiac: Chest pain/pressure [ ] ; Resting SOB [ ] ; Exertional SOB [ ] ; Orthopnea [ ] ; Pedal Edema [ ] ; Palpitations [ ] ; Syncope [ ] ; Presyncope [ ] ; Paroxysmal nocturnal dyspnea[ ]   Pulmonary: Cough [ ] ; Wheezing[ ] ; Hemoptysis[ ] ; Sputum [ ] ; Snoring [ ]   GI: Vomiting[y ]; Dysphagia[ ] ; Melena[ ] ; Hematochezia [ ] ; Heartburn[ ] ; Abdominal pain [ y]; Constipation [ ] ; Diarrhea Blue.Reese ]; BRBPR [ ]   GU:  Hematuria[ ] ; Dysuria [ ] ; Nocturia[ ]   Vascular: Pain in legs with walking [ ] ; Pain in feet with lying flat [ ] ; Non-healing sores [ ] ; Stroke [ ] ; TIA [ ] ; Slurred speech [ ] ;  Neuro: Headaches[ ] ; Vertigo[ ] ; Seizures[ ] ; Paresthesias[ ] ;Blurred vision [ ] ; Diplopia [ ] ; Vision changes [ ]   Ortho/Skin: Arthritis [ ] ; Joint pain [ ] ; Muscle pain [ ] ; Joint swelling [ ] ; Back Pain [ ] ; Rash [ ]   Psych: Depression[ ] ; Anxiety[ ]   Heme: Bleeding problems [ ] ; Clotting disorders [ ] ; Anemia [ ]   Endocrine: Diabetes [ ] ; Thyroid dysfunction[ ]     Home Medications Prior to Admission medications   Medication Sig Start Date End Date Taking? Authorizing Provider  aspirin EC 81 MG tablet Take 1 tablet (81 mg total) by mouth daily. 07/26/17   Larey Dresser, MD  atorvastatin (LIPITOR) 40 MG tablet Take 1 tablet (40 mg total) by mouth daily at 6 PM. 01/25/18   [name], MD  calcitRIOL (ROCALTROL) 0.5 MCG capsule Take 1 capsule (0.5 mcg total) by mouth every Monday, Wednesday, and Friday. 06/06/18   Georgiana Shore, NP  citalopram (CELEXA) 20 MG tablet Take 1 tablet (20 mg total) by mouth daily. 03/19/18   Georgiana Shore, NP  docusate sodium (COLACE) 100 MG capsule Take 1 capsule (100 mg total)  by mouth 2 (two) times daily as needed for mild constipation. 06/05/18   Georgiana Shore, NP  dronabinol (MARINOL) 2.5 MG capsule Take 1 capsule (2.5 mg total) by mouth 2 (two) times daily before lunch and supper. 03/27/18   Larey Dresser, MD  erythromycin (E-MYCIN) 250 MG tablet Take 1 tablet (250 mg total) by mouth 3 (three) times daily. 03/25/18   Isaiah Serge, NP  insulin aspart (NOVOLOG) 100 UNIT/ML injection Inject 5 Units into the skin 3 (three) times daily with meals. 01/22/18   Georgiana Shore, NP  insulin glargine (LANTUS) 100 UNIT/ML injection Inject 0.1 mLs (10 Units total) into the skin daily. 05/09/18   Lovenia Kim, MD  isosorbide-hydrALAZINE (BIDIL) 20-37.5 MG tablet Take 1.5  tablets by mouth every 8 (eight) hours. Take 1.5 tablets every 8 hours on non dialysis days. 03/19/18   Georgiana Shore, NP  LORazepam (ATIVAN) 0.5 MG tablet Take 1 tablet (0.5 mg total) by mouth every 12 (twelve) hours as needed (nausea). 03/27/18   Larey Dresser, MD  metoCLOPramide (REGLAN) 10 MG tablet Take 1 tablet (10 mg total) by mouth 3 (three) times daily. 01/22/18   Georgiana Shore, NP  multivitamin (RENA-VIT) TABS tablet Take 1 tablet by mouth daily.    [provider]  Nutritional Supplements (FEEDING SUPPLEMENT, NEPRO CARB STEADY,) LIQD Take 237 mLs by mouth 2 (two) times daily between meals. 06/05/18   Georgiana Shore, NP  ondansetron (ZOFRAN ODT) 4 MG disintegrating tablet Take 1 tablet (4 mg total) by mouth every 6 (six) hours as needed for nausea or vomiting. 01/10/17   Esterwood, Amy S, PA-C  pantoprazole (PROTONIX) 40 MG tablet Take 40 mg by mouth 2 (two) times daily.     [provider]  promethazine (PHENERGAN) 12.5 MG tablet Take 1 tablet (12.5 mg total) by mouth every 6 (six) hours as needed for nausea or vomiting. 09/16/17   Daune Perch, NP  warfarin (COUMADIN) 5 MG tablet Take 1 tablet (5 mg total) by mouth daily at 6 PM. 03/25/18   Isaiah Serge, NP    Past Medical History: Past Medical History:  Diagnosis Date  . Angina   . ASCVD (arteriosclerotic cardiovascular disease)    , Anterior infarction 2005, LAD diagonal bifurcation intervention 03/2004  . Automatic implantable cardiac defibrillator -St. Jude's       . Benign neoplasm of colon   . CHF (congestive heart failure) ([place])   . Chronic systolic heart failure ([place])   . Coronary artery disease     Widely patent previously placed stents in the left anterior   . Crohn's disease ([place])   . 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 Dialysis patient ([place])   . Dyspnea   . Gastroparesis   . GERD (gastroesophageal reflux disease)   . High cholesterol   . Hyperlipidemia   .  Hypersomnolent    Previous diagnosis of narcolepsy  . Hypertension, essential   . Ischemic cardiomyopathy    Ejection fraction 15-20% catheterization 2010  . Type II or unspecified type diabetes mellitus without mention of complication, not stated as uncontrolled   . Unspecified gastritis and gastroduodenitis without mention of hemorrhage     Past Surgical History: Past Surgical History:  Procedure Laterality Date  . A/V SHUNTOGRAM N/A 05/24/2018   Procedure: A/V SHUNTOGRAM;  Surgeon: Marty Heck, MD;  Location: [place] [facility];  Service: Cardiovascular;  Laterality: N/A;  . APPENDECTOMY    .  AV FISTULA PLACEMENT Right 06/26/2017   Procedure: ARTERIOVENOUS (AV) FISTULA CREATION VERSUS GRAFT RIGHT  ARM;  Surgeon: Conrad Bl[place] Corner, MD;  Location: [place];  Service: Vascular;  Laterality: Right;  . AV FISTULA PLACEMENT Right 10/31/2017   Procedure: INSERTION OF ARTERIOVENOUS (AV) ARTEGRAFT,  RIGHT UPPER ARM;  Surgeon: Conrad [place], MD;  Location: [place];  Service: Vascular;  Laterality: Right;  . AV FISTULA PLACEMENT Left 05/29/2018   Procedure: ARTERIOVENOUS (AV) FISTULA CREATION  LEFT UPPER EXTREMITY;  Surgeon: Waynetta Sandy, MD;  Location: [place];  Service: Vascular;  Laterality: Left;  . CARDIAC DEFIBRILLATOR PLACEMENT  2010   St. Jude ICD  . CENTRAL LINE INSERTION Right 05/24/2018   Procedure: CENTRAL LINE INSERTION;  Surgeon: Marty Heck, MD;  Location: [place] [facility];  Service: Cardiovascular;  Laterality: Right;  . COLECTOMY  ~ 2003   "for Crohn's" ascending colon.    . CORONARY STENT INTERVENTION N/A 11/25/2016   Procedure: Coronary Stent Intervention;  Surgeon: Leonie Man, MD;  Location: [place] [facility];  Service: Cardiovascular;  Laterality: N/A;  . EP IMPLANTABLE DEVICE N/A 09/14/2016   Procedure: ICD Generator Changeout;  Surgeon: Deboraha Sprang, MD;  Location: [place] [facility];  Service: Cardiovascular;  Laterality: N/A;  .  ESOPHAGOGASTRODUODENOSCOPY N/A 07/12/2017   Procedure: ESOPHAGOGASTRODUODENOSCOPY (EGD);  Surgeon: Jerene Bears, MD;  Location: [facility];  Service: Gastroenterology;  Laterality: N/A;  VAD pt so VAD team will need to accompany pt.   [name] FETAL SURGERY FOR CONGENITAL HERNIA     ????? pt has no knowledge of this..    . INGUINAL HERNIA REPAIR    . INSERTION OF DIALYSIS CATHETER Right 06/26/2017   Procedure: INSERTION OF TUNNELED  DIALYSIS CATHETER RIGHT INTERNAL JUGULAR;  Surgeon: Conrad Palmer, MD;  Location: [place];  Service: Vascular;  Laterality: Right;  . INSERTION OF IMPLANTABLE LEFT VENTRICULAR ASSIST DEVICE N/A 06/12/2017   Procedure: INSERTION OF IMPLANTABLE LEFT VENTRICULAR ASSIST [place] 3;  Surgeon: Ivin Poot, MD;  Location: [place];  Service: Open Heart Surgery;  Laterality: N/A;  HM3 LVAD  CIRC ARREST  NITRIC OXIDE  . INSERTION OF IMPLANTABLE LEFT VENTRICULAR ASSIST DEVICE N/A 02/28/2018   Procedure: REDO INSERTION OF IMPLANTABLE LEFT VENTRICULAR ASSIST DEVICE - HEARTMATE 3;  Surgeon: Ivin Poot, MD;  Location: [place];  Service: Open Heart Surgery;  Laterality: N/A;  . IR REMOVAL TUN CV CATH W/O FL  02/26/2018  . IR THORACENTESIS ASP PLEURAL SPACE W/IMG GUIDE  06/28/2017  . LIGATION OF ARTERIOVENOUS  FISTULA Right 10/31/2017   Procedure: LIGATION OF BRACHIO-BASILIC VEIN TRANSPOSITION RIGHT ARM;  Surgeon: Conrad , MD;  Location: [place];  Service: Vascular;  Laterality: Right;  . MULTIPLE EXTRACTIONS WITH ALVEOLOPLASTY N/A 06/08/2017   Procedure: MULTIPLE EXTRACTION WITH ALVEOLOPLASTY AND PRE PROSTHETIC SURGERY AS NEEDED;  Surgeon: Lenn Cal, DDS;  Location: [place];  Service: Oral Surgery;  Laterality: N/A;  . RIGHT HEART CATH N/A 06/07/2017   Procedure: RIGHT HEART CATH;  Surgeon: Larey Dresser, MD;  Location: [place] [facility];  Service: Cardiovascular;  Laterality: N/A;  . RIGHT HEART CATH N/A 02/08/2018   Procedure: RIGHT HEART CATH;  Surgeon: Larey Dresser, MD;  Location: [place] [facility];  Service: Cardiovascular;  Laterality: N/A;  . RIGHT/LEFT HEART CATH AND CORONARY ANGIOGRAPHY N/A 11/23/2016   Procedure: Right/Left Heart Cath and Coronary Angiography;  Surgeon: Larey Dresser, MD;  Location:  [place] INVASIVE CV LAB;  Service: Cardiovascular;  Laterality: N/A;  . TEE WITHOUT CARDIOVERSION N/A 06/12/2017   Procedure: TRANSESOPHAGEAL ECHOCARDIOGRAM (TEE);  Surgeon: Prescott Gum, Collier Salina, MD;  Location: [place];  Service: Open Heart Surgery;  Laterality: N/A;  . TEE WITHOUT CARDIOVERSION N/A 02/28/2018   Procedure: TRANSESOPHAGEAL ECHOCARDIOGRAM (TEE);  Surgeon: Prescott Gum, Collier Salina, MD;  Location: [place];  Service: Open Heart Surgery;  Laterality: N/A;  . TEMPORARY DIALYSIS CATHETER Left 05/24/2018   Procedure: TEMPORARY DIALYSIS CATHETER;  Surgeon: Marty Heck, MD;  Location: [place] [facility];  Service: Cardiovascular;  Laterality: Left;  . THROMBECTOMY AND REVISION OF ARTERIOVENTOUS (AV) GORETEX  GRAFT Right 12/12/2017   Procedure: THROMBECTOMY ARTERIOVENTOUS (AV) GORETEX  GRAFT RIGHT UPPER ARM;  Surgeon: Conrad Walker, MD;  Location: [facility];  Service: Vascular;  Laterality: Right;    Family History: Family History  Problem Relation Age of Onset  . Colon polyps Mother   . Diabetes Mother   . Heart attack Father   . Diabetes Father   . Stroke Paternal Grandfather   . Colitis Sister   . Heart disease Brother   . Colitis Sister   . Colon cancer Neg Hx     Social History: Social History   Socioeconomic History  . Marital status: Married    Spouse name: Not on file  . Number of children: Not on file  . Years of education: Not on file  . Highest education level: Not on file  Occupational History  . Not on file  Social Needs  . Financial resource strain: Not on file  . Food insecurity:    Worry: Not on file    Inability: Not on file  . Transportation needs:    Medical: Not on file    Non-medical: Not on file  Tobacco Use  .  Smoking status: Never Smoker  . Smokeless tobacco: Never Used  Substance and Sexual Activity  . Alcohol use: No  . Drug use: No  . Sexual activity: Never  Lifestyle  . Physical activity:    Days per week: Not on file    Minutes per session: Not on file  . Stress: Not on file  Relationships  . Social connections:    Talks on phone: Not on file    Gets together: Not on file    Attends religious service: Not on file    Active member of club or organization: Not on file    Attends meetings of clubs or organizations: Not on file    Relationship status: Not on file  Other Topics Concern  . Not on file  Social History Narrative  . Not on file    Allergies:  Allergies  Allergen Reactions  . Metformin And Related Diarrhea    Objective:    Vital Signs:   Temp:  [99.1 F (37.3 C)] 99.1 F (37.3 C) (10/07 1216) Pulse Rate:  [97] 97 (10/07 1216) Resp:  [18] 18 (10/07 1216) BP: (140)/(96) 140/96 (10/07 1216) SpO2:  [98 %-99 %] 98 % (10/07 1216) Weight:  [79 kg] 79 kg (10/07 1212)   Filed Weights   06/11/18 1212  Weight: 79 kg    Mean arterial Pressure 100  Physical Exam    General:  Appears ill. No resp difficulty HEENT: Normal Neck: supple. No JVD. Carotids 2+ bilat; no bruits. No lymphadenopathy or thyromegaly appreciated. Cor: Mechanical heart sounds with LVAD hum present. Left tunneled HD cath.  Lungs: Clear Abdomen: soft, nontender, nondistended. No  hepatosplenomegaly. No bruits or masses. Good bowel sounds. Driveline: C/D/I; securement device intact and driveline incorporated Extremities: no cyanosis, clubbing, rash, edema Neuro: alert & orientedx3, cranial nerves grossly intact. moves all 4 extremities w/o difficulty. Affect pleasant   Telemetry   NSR 80-90s. Personally reviewed.   EKG   Pending.  Labs    Basic Metabolic Panel: Recent Labs  Lab 06/05/18 0338  NA 139  K 4.4  CL 103  CO2 27  GLUCOSE 165*  BUN 27*  CREATININE 6.35*  CALCIUM  8.9  PHOS 4.1    Liver Function Tests: Recent Labs  Lab 06/05/18 0338  ALBUMIN 2.9*   No results for input(s): LIPASE, AMYLASE in the last 168 hours. No results for input(s): AMMONIA in the last 168 hours.  CBC: Recent Labs  Lab 06/05/18 0338  WBC 10.1  HGB 10.0*  HCT 32.6*  MCV 92.4  PLT 255    Cardiac Enzymes: No results for input(s): CKTOTAL, CKMB, CKMBINDEX, TROPONINI in the last 168 hours.  BNP: BNP (last 3 results) Recent Labs    03/02/18 0400 03/08/18 0008 03/15/18 0106  BNP 694.1* 1,576.6* 811.3*    ProBNP (last 3 results) No results for input(s): PROBNP in the last 8760 hours.   CBG: Recent Labs  Lab 06/04/18 1258 06/04/18 1706 06/04/18 2118 06/05/18 0816  GLUCAP 126* 215* 142* 125*    Coagulation Studies: No results for input(s): LABPROT, INR in the last 72 hours.  Other results:    Imaging     No results found.    Patient Profile:   [name] a 52 y.o.malewith history of CAD s/p anterior MI in 2010 and NSTEMI in 3/18 with DES to pRCA and mLAD and ischemic cardiomyopathy now s/p Heartmate 3 LVAD and ESRDHe was admitted in 10/18 and Heartmate 3 LVAD was placed. He had baseline CKD stage 3 likely from diabetic nephropathy and CHF. He developed peri-op AKI and ended up on dialysis. No renal recovery to date.  Assessment/Plan:    1. Nausea and vomiting - Secondary to gastroparesis - Will see if he is able to tolerate PO with IM ativan and phenergan. If not, he will need to be [facility] to [facility]. He will need central access. 2. ESRD with clotted AV graft: Unable to unclot graft in cath lab.  S/P AV fistula LUE 9/24. Plan for 2nd stage 6 weeks. He has a palpable thrill at AVF site. Currently dialyzing through tunneled HD cath. - HD MWF. Unable to pull any fluid off today, but completed all but 20 minutes of treatment. - Will need to consult nephrology if he gets admitted.   3. Chronic Systolic Heart Failure with HMIII  VAD, St Jude ICD. Volume looks dry on exam. Volume managed per dialysis.  - Continue Bidil on non dialysis days. He has missed the last couple days with #1. - Continue asa 81 mg daily.  - Continue coumadin with INR goal 2-3. Continue ASA 81 mg daily. 4. HTN: Continue Bidil on non-HD days. MAPs 100 5. h/o recurrent DVTs: Factor V Leiden heterozygote. - Continue coumadin as above.  6. Diabetic gastroparesis:See #1 - Continue reglan 5 mg tid/ac and Marinol.  - Continue Protonix bid.  - Continue eythromycin indefinitely.  - Continue Ativan and Phenergan as needed.  7. CN:[id number] home regimen. 8. MOQ:HUTMLYYT bilateral disease on ABIs in 8/18. Denies claudication currently, no pedal ulcers.  9. Atrial flutter: Paroxysmal. Remains in NSR. Continue coumadin 10. CAD: No CP - Continue statin and  asa.  11. Difficult IV access: Would eventually like a port placed. Will need central access if he gets admitted.  Will see if he is able to keep down POs with IM ativan and phenergan. If he is, he should be able to go home. If not, will need to admit. Discussed with ED RN.  I reviewed the LVAD parameters from today, and compared the results to the patient's prior recorded data.  No programming changes were made.  The LVAD is functioning within specified parameters.  The patient performs LVAD self-test daily.  LVAD interrogation was negative for any significant power changes, alarms or PI events/speed drops.  LVAD equipment check completed and is in good working order.  Back-up equipment present.   LVAD education done on emergency procedures and precautions and reviewed exit site care.  Length of Stay: [place], NP 06/11/2018, 12:30 PM  VAD Team Pager [phone] (7am - 7am) +++VAD ISSUES ONLY+++   Advanced Heart Failure Team Pager [phone] (M-F; [place])  Please contact [facility] for night-coverage after hours (4p -7a ) and weekends on [email or web address] for all non- LVAD  Issues   Patient seen with NP, agree with the above note.  He was doing well until Friday when he developed his typical symptoms of gastroparesis flare with nausea/vomiting.  He has not had significant po intake since Friday.  He was sent to the ER from dialysis today due poor po intake and symptoms.    LVAD parameters are stable on check.  Labs are not back yet, were just sent.  Unable to get IV access.   He has now had IM Phenergan and Ativan.  If he feels better and can take po, he can go home.  If he is unable to take po, will need IV access (CVL) and admission.    [name] 06/11/2018 1:55 PM  Labs returned with WBCs elevated to 22, temperature 99.  Will get CXR, blood cultures, check driveline site.  Suspect he will need IV access and admission.   [name] 06/11/2018 2:56 PM

## 2018-06-12 DIAGNOSIS — R112 Nausea with vomiting, unspecified: Secondary | ICD-10-CM

## 2018-06-12 LAB — BASIC METABOLIC PANEL
Anion gap: 15 (ref 5–15)
BUN: 35 mg/dL — ABNORMAL HIGH (ref 6–20)
CO2: 22 mmol/L (ref 22–32)
Calcium: 8.5 mg/dL — ABNORMAL LOW (ref 8.9–10.3)
Chloride: 99 mmol/L (ref 98–111)
Creatinine, Ser: 7.9 mg/dL — ABNORMAL HIGH (ref 0.61–1.24)
GFR calc Af Amer: 8 mL/min — ABNORMAL LOW (ref >60)
GFR calc non Af Amer: 7 mL/min — ABNORMAL LOW (ref >60)
Glucose, Bld: 75 mg/dL (ref 70–99)
Potassium: 4.4 mmol/L (ref 3.5–5.1)
Sodium: 136 mmol/L (ref 135–145)

## 2018-06-12 LAB — GLUCOSE, CAPILLARY
GLUCOSE-CAPILLARY: 94 mg/dL (ref 70–99)
Glucose-Capillary: 134 mg/dL — ABNORMAL HIGH (ref 70–99)
Glucose-Capillary: 92 mg/dL (ref 70–99)
Glucose-Capillary: 75 mg/dL (ref 70–99)

## 2018-06-12 LAB — CBC
HCT: 41.1 % (ref 39.0–52.0)
Hemoglobin: 13 g/dL (ref 13.0–17.0)
MCH: 28.6 pg (ref 26.0–34.0)
MCHC: 31.6 g/dL (ref 30.0–36.0)
MCV: 90.3 fL (ref 80.0–100.0)
PLATELETS: 200 10*3/uL (ref 150–400)
RBC: 4.55 MIL/uL (ref 4.22–5.81)
RDW: 18.6 % — AB (ref 11.5–15.5)
WBC: 10.9 10*3/uL — ABNORMAL HIGH (ref 4.0–10.5)

## 2018-06-12 LAB — PROTIME-INR
INR: 2.82
Prothrombin Time: 29.5 seconds — ABNORMAL HIGH (ref 11.4–15.2)

## 2018-06-12 LAB — LACTATE DEHYDROGENASE: LDH: 174 U/L (ref 98–192)

## 2018-06-12 MED ORDER — PRO-STAT SUGAR FREE PO LIQD
30.0000 mL | Freq: Three times a day (TID) | ORAL | Status: DC
  Administered 2018-06-13: 30 mL via ORAL
  Filled 2018-06-12: qty 30

## 2018-06-12 MED ORDER — CHLORHEXIDINE GLUCONATE CLOTH 2 % EX PADS
6.0000 | MEDICATED_PAD | Freq: Every day | CUTANEOUS | Status: DC
  Administered 2018-06-13: 6 via TOPICAL

## 2018-06-12 MED ORDER — SODIUM CHLORIDE 0.9 % IV BOLUS
500.0000 mL | Freq: Once | INTRAVENOUS | Status: AC
  Administered 2018-06-12: 500 mL via INTRAVENOUS

## 2018-06-12 MED ORDER — FENTANYL CITRATE (PF) 100 MCG/2ML IJ SOLN
25.0000 ug | Freq: Once | INTRAMUSCULAR | Status: AC
  Administered 2018-06-12: 25 ug via INTRAVENOUS
  Filled 2018-06-12: qty 2

## 2018-06-12 MED ORDER — SODIUM CHLORIDE 0.9 % IV SOLN
INTRAVENOUS | Status: AC
  Administered 2018-06-12: 08:00:00 via INTRAVENOUS

## 2018-06-12 MED ORDER — SODIUM CHLORIDE 0.9 % IV SOLN
INTRAVENOUS | Status: DC
  Administered 2018-06-12 (×2): via INTRAVENOUS

## 2018-06-12 MED ORDER — WARFARIN SODIUM 3 MG PO TABS
3.0000 mg | ORAL_TABLET | Freq: Once | ORAL | Status: AC
  Administered 2018-06-12: 3 mg via ORAL
  Filled 2018-06-12: qty 1

## 2018-06-12 NOTE — Progress Notes (Signed)
Initial Nutrition Assessment  DOCUMENTATION CODES:   Not applicable  INTERVENTION:   -Continue Nepro Shake po TID, each supplement provides 425 kcal and 19 grams protein -30 ml Prostat TID, each supplement provides 100 kcals and 15 grams protein -Continue renal MVI daily  NUTRITION DIAGNOSIS:   Inadequate oral intake related to altered GI function, decreased appetite as evidenced by meal completion < 25%.  GOAL:   Patient will meet greater than or equal to 90% of their needs  MONITOR:   PO intake, Supplement acceptance, Labs, Weight trends, Skin, I & O's  REASON FOR ASSESSMENT:   Consult Assessment of nutrition requirement/status  ASSESSMENT:   [name] is a 52 y.o. male with history of CAD s/p anterior MI in 2010 and NSTEMI in 3/18 with DES to pRCA and mLAD and ischemic cardiomyopathy now s/p Heartmate 3 LVAD (had pump kinking with thrombosis and pump replacement) and ESRD.  He has severe gastroparesis and returns with nausea/vomiting/inability to take po.   Pt admitted with nausea and vomiting secondary to gastroparesis.   Reviewed I/O's: +120 ml x 24 hours   Per MD notes, pt continues with complaints of nausea and abdominal pain. Intake has been very little.   Attempted to speak with pt, however, pt with covers over head and did not arouse to voice.   Reviewed nephrology notes, dry weight 89.0 kg; plan to increase fluids due to pt being below dry weight (suspect dehydration due to nausea and vomiting). Wt has been stable, noted wt has consistently been between 75-80 kg.   Medications reviewed and include reglan.   Last Hgb A1c: 5.8 (06/07/18).  PTA DM medications are 5 units insulin aspart TID and 10 units insulin glargine daily.   Labs reviewed: CBGS: 82-134 (inpatient orders for glycemic control are 0-5 units insulin aspart q HS, 0-9 units insulin aspart TID with meals, and 3 units insulin aspart TID).   NUTRITION - FOCUSED PHYSICAL EXAM:    Most  Recent Value  Orbital Region  Unable to assess  Upper Arm Region  Unable to assess  Thoracic and Lumbar Region  Unable to assess  Buccal Region  Unable to assess  Temple Region  Unable to assess  Clavicle Bone Region  Unable to assess  Clavicle and Acromion Bone Region  Unable to assess  Scapular Bone Region  Unable to assess  Dorsal Hand  Unable to assess  Patellar Region  Unable to assess  Anterior Thigh Region  Unable to assess  Posterior Calf Region  Unable to assess  Edema (RD Assessment)  Unable to assess  Hair  Unable to assess  Eyes  Unable to assess  Mouth  Unable to assess  Skin  Unable to assess  Nails  Unable to assess       Diet Order:   Diet Order            Diet renal/carb modified with fluid restriction Diet-HS Snack? Nothing; Fluid restriction: 1800 mL Fluid; Room service appropriate? Yes; Fluid consistency: Thin  Diet effective now              EDUCATION NEEDS:   No education needs have been identified at this time  Skin:  Skin Assessment: Reviewed RN Assessment  Last BM:  PTA  Height:   Ht Readings from Last 1 Encounters:  06/11/18 5' 8"  (1.727 m)    Weight:   Wt Readings from Last 1 Encounters:  06/12/18 76.1 kg    Ideal Body Weight:  70 kg  BMI:  Body mass index is 25.51 kg/m.  Estimated Nutritional Needs:   Kcal:  2100-2300  Protein:  105-120 grams  Fluid:  1000 ml + UOP    Cleve Paolillo A. Jimmye Norman, RD, LDN, CDE Pager: [phone] After hours Pager: [phone]

## 2018-06-12 NOTE — Progress Notes (Signed)
[name] Progress Note  Subjective: ESRD pt w/ LVAD was here last week of September w/ clotted/ failed R arm AVG, had new TDC and new L arm 1st stage AVF completed. Had HD yesterday, admitted last night w/ severe refractory N/V and ^'d WBC.  N/V is a recurrent issue for this pt. WBC is down this am, no fevers and blood cx's pending.   Vitals:   06/11/18 1932 06/11/18 2311 06/12/18 0300 06/12/18 0800  BP: 100/84 (!) 116/92 106/66 97/64  Pulse:  (!) 59 88   Resp:  16 15 16   Temp: 98.1 F (36.7 C) 98 F (36.7 C) 98.3 F (36.8 C) 98.4 F (36.9 C)  TempSrc: Oral Axillary Oral Oral  SpO2:  96% 97% 97%  Weight:   76.1 kg   Height:        Inpatient medications: . aspirin EC  81 mg Oral Daily  . atorvastatin  40 mg Oral q1800  . calcitRIOL  0.5 mcg Oral Q M,W,F  . citalopram  20 mg Oral Daily  . dronabinol  2.5 mg Oral BID AC  . erythromycin  250 mg Oral TID  . feeding supplement (NEPRO CARB STEADY)  237 mL Oral BID BM  . insulin aspart  0-5 Units Subcutaneous QHS  . insulin aspart  0-9 Units Subcutaneous TID WC  . insulin aspart  3 Units Subcutaneous TID WC  . isosorbide-hydrALAZINE  1.5 tablet Oral 3 times per day on Sun Tue Thu Sat  . LORazepam  0.5 mg Intravenous Once  . metoCLOPramide  10 mg Oral TID  . multivitamin  1 tablet Oral Daily  . pantoprazole  40 mg Oral BID  . promethazine  12.5 mg Intravenous Once  . warfarin  3 mg Oral ONCE-1800  . Warfarin - Pharmacist Dosing Inpatient   Does not apply q1800   . sodium chloride    . sodium chloride 50 mL/hr at 06/12/18 0804   acetaminophen, docusate sodium, hydrALAZINE, LORazepam, ondansetron (ZOFRAN) IV, ondansetron, promethazine  Iron/TIBC/Ferritin/ %Sat    Component Value Date/Time   IRON 40 (L) 11/02/2017 0016   TIBC 188 (L) 11/02/2017 0016   FERRITIN 523 (H) 11/02/2017 0016   IRONPCTSAT 21 11/02/2017 0016    Exam: Alert,  No distress No jvd Chest clear bilat Cor - LVAD hum Abd driveline ,  soft ntnd no ascites R arm old AVG/ LUA AVF +bruit/ R IJ TDC clean exit site NF, ox 3  Dialysis: MWF [facility]  4h 42mn  890kg  4K/2.25 bath  Hep none (on coumadin)   R IJ TDC/ maturing LUA AVF  - mircera 50 every 2 wks   - vit D 0.5 ug tiw po      Impression: 1. Nausea/ vomiting - recurrent issue, on multiple meds, per primary  2. ESRD on HD. Had last HD Monday.  HD tomorrow.  3. Volume - is 3-4 kg under dry wt, have d/w cards, will give 1.5 L over 18 hrs NS 4. Anemia ckd - Hb 13, prob hemoconcentrated, hold esa 5. MBD ckd - cont vit D, phos and CA ok 6. ^WBC - down overnight, didn't get abx, cx's pending. TDC site is clean.   Plan - IVF's, HD w/ no UF tomorrow upstairs   RKelly SplinterMD CKentuckyKidney [name] pager [phone]  06/12/2018, 12:17 PM   Recent Labs  Lab 06/11/18 1242 06/11/18 1303 06/12/18 0352  NA 135  --  136  K 4.2  --  4.4  CL 95*  --  99  CO2 23  --  22  GLUCOSE 114*  --  75  BUN 28*  --  35*  CREATININE 6.22*  --  7.90*  CALCIUM 8.7*  --  8.5*  INR  --  2.57 2.82   No results for input(s): AST, ALT, ALKPHOS, BILITOT, PROT in the last 168 hours. Recent Labs  Lab 06/11/18 1242 06/12/18 0352  WBC 22.9* 10.9*  NEUTROABS 20.2*  --   HGB 13.6 13.0  HCT 42.9 41.1  MCV 91.3 90.3  PLT 228 200

## 2018-06-12 NOTE — Progress Notes (Signed)
   Called by nursing staff for ongoing nausea and abdominal pain.   He continues to receive home antiemetics. WBC coming back down 22.9>10.9.   On exam he is non tender. + BS.  Had BM 10/7.   Give dose 25 mcg fentanyl x1.   May need CT abdomen if persists.   Ricarda Atayde NP-C  10:28 AM

## 2018-06-12 NOTE — Progress Notes (Signed)
LVAD Coordinator Rounding Note:  Pt presented to ED 06/11/18 from dialysis center following clean treatment. Dialysis center states pt N/V and extremely weak. Pt admitted from ED w/elevated WBC at 23 and intractable N/V.  HM III LVAD implanted on 06/12/17 by Dr. Prescott Gum under Destination Therapy criteria due to renal insufficiency. Pump exchange on 03/01/18 for outflow graft twist/obstruction.   Pt is vomiting when I entered the room this morning. Pt states that he is still very nauseaous and that his nurse just gave him some medications.    Vital signs: Temp: 98.4 HR:  113 Doppler Pressure: 72 Auto BP: 97/64 (74) O2 Sat: 97% on RA Wt: 76.1kg  LVAD interrogation reveals:  Speed: 5500 Flow: 4.5 Power: 4.3w PI: 2.3 Alarms: none  Events:  10+ daily Hematocrit: 41 Fixed speed: 5500 Low speed limit: 5200  Drive Line:  Weekly dressing changes. Next dressing change is due 06/18/18.  Labs: LDH trend: 174  INR trend: 2.82  Blood Products: - 05/28/18  2 units PCs     Anticoagulation Plan: - INR Goal: 2.0 - 3.0 - ASA Dose: 81 mg daily  - Heparin gtt until INR therapeutic - per pharmacy  Device: -St Jude dual  -Therapies: on 200 bpm - Monitor: on VT 150 bpm  Adverse Events on VAD: - ESRD post LVAD; started CVVH 06/15/17; HD started 06/20/17 - Hospitalization 11/5 - 07/16/17 > gastroparesis diagnosis after EGD - Hospitalization 11/26 - 08/04/17 > gastroparesis - Hospitalization 1/8 - 09/16/17>gastroparesis - referred to Dr. Corliss Parish at [facility] - 10/31/17>rightupper arm arteriovenous graft with Artegraft; ligation of right first stage brachial vein transposition  - 12/11/13 elective admission for thrombectomy for AV graft in RUA  - 12/20/17 > intractable N/V - 01/19/18 > intractable N/V sent by EMS from dialysis center - 03/01/18 > outflow graft twist/occulsion requiring pump exchange - 05/23/18 > admitted for non-working fistula in RUA - 06/10/18 > admitted for intractable  N/V   Plan/Recommendations:  1. Please page VAD coordinator with any equipment questions or concerns.  [name] RN [place] Coordinator  Office: [phone]  24/7 Pager: [phone]

## 2018-06-12 NOTE — Progress Notes (Addendum)
Patient ID: [name], male   DOB: [birth year], 52 y.o.   MRN: [id number]   Advanced Heart Failure VAD Team Note  PCP-Cardiologist: No primary care provider on file.   Subjective:    Patient was admitted with nausea/vomiting and inability to take po.  Initial WBCs 22.9 but down to 10.9 today with no intervention.  Still nauseated this morning but better, not taking much by mouth still.  No further vomiting.   LVAD INTERROGATION:  HeartMate 3 LVAD:   Flow 4.6 liters/min, speed 5500, power 4.1, PI 2.5.  11 PI events.    Objective:    Vital Signs:   Temp:  [98 F (36.7 C)-99.1 F (37.3 C)] 98.3 F (36.8 C) (10/08 0300) Pulse Rate:  [59-108] 88 (10/08 0300) Resp:  [0-20] 15 (10/08 0300) BP: (92-163)/(66-105) 106/66 (10/08 0300) SpO2:  [93 %-100 %] 97 % (10/08 0300) Weight:  [76.1 kg-79 kg] 76.1 kg (10/08 0300)   Mean arterial Pressure 80s this morning, higher yesterday.   Intake/Output:   Intake/Output Summary (Last 24 hours) at 06/12/2018 0758 Last data filed at 06/12/2018 0300 Gross per 24 hour  Intake 120 ml  Output -  Net 120 ml     Physical Exam    General:  Uncomfortable (nauseated) HEENT: normal Neck: supple. JVP . Carotids 2+ bilat; no bruits. No lymphadenopathy or thyromegaly appreciated. Cor: Mechanical heart sounds with LVAD hum present. Lungs: clear Abdomen: soft, nontender, nondistended. No hepatosplenomegaly. No bruits or masses. Good bowel sounds. Driveline: C/D/I; securement device intact and driveline incorporated Extremities: no cyanosis, clubbing, rash, edema Neuro: alert & orientedx3, cranial nerves grossly intact. moves all 4 extremities w/o difficulty. Affect pleasant   Telemetry   Sinus tachy 100s-110s  Labs   Basic Metabolic Panel: Recent Labs  Lab 06/11/18 1242 06/12/18 0352  NA 135 136  K 4.2 4.4  CL 95* 99  CO2 23 22  GLUCOSE 114* 75  BUN 28* 35*  CREATININE 6.22* 7.90*  CALCIUM 8.7* 8.5*    Liver Function Tests: No  results for input(s): AST, ALT, ALKPHOS, BILITOT, PROT, ALBUMIN in the last 168 hours. No results for input(s): LIPASE, AMYLASE in the last 168 hours. No results for input(s): AMMONIA in the last 168 hours.  CBC: Recent Labs  Lab 06/11/18 1242 06/12/18 0352  WBC 22.9* 10.9*  NEUTROABS 20.2*  --   HGB 13.6 13.0  HCT 42.9 41.1  MCV 91.3 90.3  PLT 228 200    INR: Recent Labs  Lab 06/11/18 1303 06/12/18 0352  INR 2.57 2.82    Other results:  EKG:    Imaging   Dg Chest Portable 1 View  Result Date: 06/11/2018 CLINICAL DATA:  Leukocytosis. Abdominal pain and vomiting. EXAM: PORTABLE CHEST 1 VIEW COMPARISON:  05/27/2018. FINDINGS: Stable enlarged cardiac silhouette, coronary artery stents, LVAD, left subclavian pacer and AICD leads and left jugular double-lumen catheter with its tip added just above the superior cavoatrial junction. Clear lungs with normal vascularity. Unremarkable bones. IMPRESSION: No acute abnormality.  Stable cardiomegaly. Electronically Signed   By: Claudie Revering M.D.   On: 06/11/2018 15:22      Medications:     Scheduled Medications: . aspirin EC  81 mg Oral Daily  . atorvastatin  40 mg Oral q1800  . calcitRIOL  0.5 mcg Oral Q M,W,F  . citalopram  20 mg Oral Daily  . dronabinol  2.5 mg Oral BID AC  . erythromycin  250 mg Oral TID  . feeding supplement (  NEPRO CARB STEADY)  237 mL Oral BID BM  . insulin aspart  0-5 Units Subcutaneous QHS  . insulin aspart  0-9 Units Subcutaneous TID WC  . insulin aspart  3 Units Subcutaneous TID WC  . isosorbide-hydrALAZINE  1.5 tablet Oral 3 times per day on Sun Tue Thu Sat  . LORazepam  0.5 mg Intravenous Once  . metoCLOPramide  10 mg Oral TID  . multivitamin  1 tablet Oral Daily  . pantoprazole  40 mg Oral BID  . promethazine  12.5 mg Intravenous Once  . Warfarin - Pharmacist Dosing Inpatient   Does not apply q1800     Infusions: . sodium chloride    . sodium chloride       PRN  Medications:  acetaminophen, docusate sodium, hydrALAZINE, LORazepam, ondansetron (ZOFRAN) IV, ondansetron, promethazine   Patient Profile   [name] a 52 y.o.malewith history of CAD s/p anterior MI in 2010 and NSTEMI in 3/18 with DES to pRCA and mLAD and ischemic cardiomyopathy now s/p Heartmate 3 LVAD (had pump kinking with thrombosis and pump replacement) and ESRD. He has severe gastroparesis and returns with nausea/vomiting/inability to take po.   Assessment/Plan:    1. Nausea and vomiting: Secondary to gastroparesis.  This has been a recurrent problem, has been quite severe.  Re-admitted with nausea/vomiting/inability to take po.  - He is chronically on erythromycin, continue.  - Continue home Marinol, Reglan.  - He can get Ativan, Phenergan, Zofran as needed.   - He can go home when he is able to take po.  2. ESRD with clotted AV graft: Unable to unclot graft in cath lab. S/P AV fistula LUE 9/24. Plan for 2nd stage 6 weeks. He has a palpable thrill at AVF site. Currently dialyzing through tunneled HD cath. - HD MWF.Will let nephrology know he is here.  3. Chronic Systolic Heart Failure: HM3 VAD, St Jude ICD.  He had pump replacement due to [facility] outflow tract kinking.  Volume looks dry on exam, no po.    - Continue Bidil on non dialysis days. BP better-controlled this morning.  - Continue ASA 81 mg daily.  - Continue coumadin with INR goal 2-3.  - Will give very gentle IVF today, NS 50 cc/hr x 6 hours.  4. HTN: Continue Bidil on non-HD days.MAPs 100 5. h/o recurrent DVTs: Factor V Leiden heterozygote. - Continue coumadin as above.  6. HO:ZYYQMGNO home regimen. 7. IBB:CWUGQBVQ bilateral disease on ABIs in 8/18. Denies claudication currently, no pedal ulcers.  8. Atrial flutter: Paroxysmal. Remains in NSR. Continue coumadin 9. CAD:No CP - Continue statin and asa.  10. ID: WBCs 22.9 at admission yesterday.  Has had no fever, WBCs 10.9 today.  Cultures sent,  CXR clear. Driveline and HD catheter sites benign. ?Reaction to profuse vomiting.   I reviewed the LVAD parameters from today, and compared the results to the patient's prior recorded data.  No programming changes were made.  The LVAD is functioning within specified parameters.  The patient performs LVAD self-test daily.  LVAD interrogation was negative for any significant power changes, alarms or PI events/speed drops.  LVAD equipment check completed and is in good working order.  Back-up equipment present.   LVAD education done on emergency procedures and precautions and reviewed exit site care.  Length of Stay: 1  Loralie Champagne, MD 06/12/2018, 7:58 AM  VAD Team --- VAD ISSUES ONLY--- Pager [phone] (7am - 7am)  Advanced Heart Failure Team  Pager [phone] (M-F; 7a -  4p)  Please contact [facility] for night-coverage after hours (4p -7a ) and weekends on [email or web address]

## 2018-06-12 NOTE — Progress Notes (Addendum)
[place] for coumadin Indication: LVAD, recurrent DVT, Factor V Leiden heterozygote  Allergies  Allergen Reactions  . Metformin And Related Diarrhea    Patient Measurements: Height: 5' 8"  (172.7 cm) Weight: 167 lb 12.3 oz (76.1 kg) IBW/kg (Calculated) : 68.4  Vital Signs: Temp: 98.4 F (36.9 C) (10/08 0800) Temp Source: Oral (10/08 0800) BP: 97/64 (10/08 0800) Pulse Rate: 88 (10/08 0300)  Labs: Recent Labs    06/11/18 1242 06/11/18 1303 06/12/18 0352  HGB 13.6  --  13.0  HCT 42.9  --  41.1  PLT 228  --  200  LABPROT  --  27.4* 29.5*  INR  --  2.57 2.82  CREATININE 6.22*  --  7.90*    Estimated Creatinine Clearance: 10.5 mL/min (A) (by C-G formula based on SCr of 7.9 mg/dL (H)).   Medical History: Past Medical History:  Diagnosis Date  . Angina   . ASCVD (arteriosclerotic cardiovascular disease)    , Anterior infarction 2005, LAD diagonal bifurcation intervention 03/2004  . Automatic implantable cardiac defibrillator -St. Jude's       . Benign neoplasm of colon   . CHF (congestive heart failure) ([place])   . Chronic systolic heart failure ([place])   . Coronary artery disease     Widely patent previously placed stents in the left anterior   . Crohn's disease ([place])   . 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 Dialysis patient ([place])   . Dyspnea   . Gastroparesis   . GERD (gastroesophageal reflux disease)   . High cholesterol   . Hyperlipidemia   . Hypersomnolent    Previous diagnosis of narcolepsy  . Hypertension, essential   . Ischemic cardiomyopathy    Ejection fraction 15-20% catheterization 2010  . Type II or unspecified type diabetes mellitus without mention of complication, not stated as uncontrolled   . Unspecified gastritis and gastroduodenitis without mention of hemorrhage     Assessment: [name] is a 84y old male, presenting to [facility] ED for evaluation of continued N/V since 10/5.  Pharmacy to dose coumadin for LVAD, recurrent DVT, Factor V Leiden heterozygote. INR Goal 2-3. PTA warfarin regimen 40m daily. INR 2.57 on admit.  10/8: INR this morning bumped to 2.82 with 58mdose last night. Will give reduced dose tonight with reduced PO intake. CBC stable.   Goal of Therapy:  INR 2-3 Monitor platelets by anticoagulation protocol: Yes   Plan:  Give coumadin 50m950m1 tonight Monitor daily INR, CBC, s/sx of bleeding  Thank you for involving pharmacy in this patient's care.  DylJanae BridgemanharmD PGY1 Pharmacy Resident Phone: [phone]/04/2018 8:42 AM

## 2018-06-12 NOTE — Progress Notes (Signed)
Notified Dr.Lin with nephrology made him aware that patient had labs drawn from left arm at some point on this admission this is the location of the new graft . No pink bracelet was on arm.Patient was drowsy from medication.  Patient made me aware of the situation after he became fully awake and began his bath and pulled the tape off his arm from lab draws x 2 sites hand and A.C. Bruit strong and thrill present but faint graft is still maturing. Pink bracelet placed to left arm and no new orders received from MD. I will continue to monitor.

## 2018-06-13 ENCOUNTER — Ambulatory Visit (HOSPITAL_COMMUNITY): Payer: Self-pay

## 2018-06-13 LAB — PROTIME-INR
INR: 3.91
Prothrombin Time: 38 seconds — ABNORMAL HIGH (ref 11.4–15.2)

## 2018-06-13 LAB — CBC
HEMATOCRIT: 33.8 % — AB (ref 39.0–52.0)
HEMOGLOBIN: 10.7 g/dL — AB (ref 13.0–17.0)
MCH: 28.5 pg (ref 26.0–34.0)
MCHC: 31.7 g/dL (ref 30.0–36.0)
MCV: 89.9 fL (ref 80.0–100.0)
Platelets: 199 10*3/uL (ref 150–400)
RBC: 3.76 MIL/uL — AB (ref 4.22–5.81)
RDW: 18.4 % — ABNORMAL HIGH (ref 11.5–15.5)
WBC: 9.7 10*3/uL (ref 4.0–10.5)
nRBC: 0 % (ref 0.0–0.2)

## 2018-06-13 LAB — BASIC METABOLIC PANEL
Anion gap: 10 (ref 5–15)
BUN: 46 mg/dL — AB (ref 6–20)
CHLORIDE: 101 mmol/L (ref 98–111)
CO2: 23 mmol/L (ref 22–32)
Calcium: 7.8 mg/dL — ABNORMAL LOW (ref 8.9–10.3)
Creatinine, Ser: 9.69 mg/dL — ABNORMAL HIGH (ref 0.61–1.24)
GFR calc non Af Amer: 5 mL/min — ABNORMAL LOW (ref >60)
GFR, EST AFRICAN AMERICAN: 6 mL/min — AB (ref >60)
Glucose, Bld: 94 mg/dL (ref 70–99)
POTASSIUM: 3.9 mmol/L (ref 3.5–5.1)
Sodium: 134 mmol/L — ABNORMAL LOW (ref 135–145)

## 2018-06-13 LAB — GLUCOSE, CAPILLARY
GLUCOSE-CAPILLARY: 79 mg/dL (ref 70–99)
GLUCOSE-CAPILLARY: 177 mg/dL — AB (ref 70–99)

## 2018-06-13 LAB — LACTATE DEHYDROGENASE: LDH: 150 U/L (ref 98–192)

## 2018-06-13 MED ORDER — WARFARIN SODIUM 1 MG PO TABS
1.0000 mg | ORAL_TABLET | Freq: Once | ORAL | Status: DC
  Filled 2018-06-13: qty 1

## 2018-06-13 MED ORDER — WARFARIN SODIUM 5 MG PO TABS: 5.0000 mg | ORAL_TABLET | Freq: Every day | ORAL | 6 refills | Status: DC

## 2018-06-13 MED ORDER — NEPRO/CARBSTEADY PO LIQD: 237.0000 mL | Freq: Two times a day (BID) | ORAL | 0 refills | Status: DC

## 2018-06-13 MED ORDER — HEPARIN SODIUM (PORCINE) 1000 UNIT/ML IJ SOLN
INTRAMUSCULAR | Status: AC
  Administered 2018-06-13: 3400 [IU]
  Filled 2018-06-13: qty 4

## 2018-06-13 NOTE — Discharge Instructions (Signed)

## 2018-06-13 NOTE — Progress Notes (Signed)
[facility] Progress Note  Subjective: feeling much better today  Vitals:   06/12/18 2300 06/12/18 2335 06/13/18 0330 06/13/18 0335  BP: (!) 85/74 (!) 85/74 (!) 107/92 (!) 107/92  Pulse: 97  80   Resp: 20  16   Temp: 98.3 F (36.8 C)  98.5 F (36.9 C)   TempSrc: Oral  Oral   SpO2: (!) 19%     Weight:   78.2 kg   Height:        Inpatient medications: . aspirin EC  81 mg Oral Daily  . atorvastatin  40 mg Oral q1800  . calcitRIOL  0.5 mcg Oral Q M,W,F  . Chlorhexidine Gluconate Cloth  6 each Topical Q0600  . citalopram  20 mg Oral Daily  . dronabinol  2.5 mg Oral BID AC  . erythromycin  250 mg Oral TID  . feeding supplement (NEPRO CARB STEADY)  237 mL Oral BID BM  . feeding supplement (PRO-STAT SUGAR FREE 64)  30 mL Oral TID BM  . insulin aspart  0-5 Units Subcutaneous QHS  . insulin aspart  0-9 Units Subcutaneous TID WC  . insulin aspart  3 Units Subcutaneous TID WC  . isosorbide-hydrALAZINE  1.5 tablet Oral 3 times per day on Sun Tue Thu Sat  . metoCLOPramide  10 mg Oral TID  . multivitamin  1 tablet Oral Daily  . pantoprazole  40 mg Oral BID  . warfarin  1 mg Oral ONCE-1800  . Warfarin - Pharmacist Dosing Inpatient   Does not apply q1800   . sodium chloride     acetaminophen, docusate sodium, hydrALAZINE, LORazepam, ondansetron (ZOFRAN) IV, ondansetron, promethazine  Iron/TIBC/Ferritin/ %Sat    Component Value Date/Time   IRON 40 (L) 11/02/2017 0016   TIBC 188 (L) 11/02/2017 0016   FERRITIN 523 (H) 11/02/2017 0016   IRONPCTSAT 21 11/02/2017 0016    Exam: Alert,  No distress No jvd Chest clear bilat Cor - LVAD hum Abd driveline , soft ntnd no ascites R arm old AVG/ LUA AVF +bruit/ R IJ TDC clean exit site NF, ox 3  Dialysis: MWF [facility]  4h 110mn  890kg  4K/2.25 bath  Hep none (on coumadin)   R IJ TDC/ maturing LUA AVF  - mircera 50 every 2 wks   - vit D 0.5 ug tiw po      Impression: 1. Nausea/ vomiting - recurrent issue, resolved today.  For dc after HD today 2. ESRD on HD. Had last HD Monday.  HD today 2nd shift.  3. Volume - still dry, no UF on HD today 4. Anemia ckd - Hb 13, stable 5. MBD ckd - cont vit D, phos and CA ok  Plan - HD this afternoon.     RKelly SplinterMD CNewell Rubbermaidpager [phone]  06/13/2018, 11:03 AM   Recent Labs  Lab 06/12/18 0352 06/13/18 0236  NA 136 134*  K 4.4 3.9  CL 99 101  CO2 22 23  GLUCOSE 75 94  BUN 35* 46*  CREATININE 7.90* 9.69*  CALCIUM 8.5* 7.8*  INR 2.82 3.91   No results for input(s): AST, ALT, ALKPHOS, BILITOT, PROT in the last 168 hours. Recent Labs  Lab 06/11/18 1242 06/12/18 0352 06/13/18 0236  WBC 22.9* 10.9* 9.7  NEUTROABS 20.2*  --   --   HGB 13.6 13.0 10.7*  HCT 42.9 41.1 33.8*  MCV 91.3 90.3 89.9  PLT 228 200 199

## 2018-06-13 NOTE — Progress Notes (Signed)
[place] for coumadin Indication: LVAD, recurrent DVT, Factor V Leiden heterozygote  Allergies  Allergen Reactions  . Metformin And Related Diarrhea    Patient Measurements: Height: 5' 8"  (172.7 cm) Weight: 172 lb 6.4 oz (78.2 kg) IBW/kg (Calculated) : 68.4  Vital Signs: Temp: 98.5 F (36.9 C) (10/09 0330) Temp Source: Oral (10/09 0330) BP: 107/92 (10/09 0335) Pulse Rate: 80 (10/09 0330)  Labs: Recent Labs    06/11/18 1242 06/11/18 1303 06/12/18 0352 06/13/18 0236  HGB 13.6  --  13.0 10.7*  HCT 42.9  --  41.1 33.8*  PLT 228  --  200 199  LABPROT  --  27.4* 29.5* 38.0*  INR  --  2.57 2.82 3.91  CREATININE 6.22*  --  7.90* 9.69*    Estimated Creatinine Clearance: 8.5 mL/min (A) (by C-G formula based on SCr of 9.69 mg/dL (H)).   Medical History: Past Medical History:  Diagnosis Date  . Angina   . ASCVD (arteriosclerotic cardiovascular disease)    , Anterior infarction 2005, LAD diagonal bifurcation intervention 03/2004  . Automatic implantable cardiac defibrillator -St. Jude's       . Benign neoplasm of colon   . CHF (congestive heart failure) ([place])   . Chronic systolic heart failure ([place])   . Coronary artery disease     Widely patent previously placed stents in the left anterior   . Crohn's disease ([place])   . 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 Dialysis patient ([place])   . Dyspnea   . Gastroparesis   . GERD (gastroesophageal reflux disease)   . High cholesterol   . Hyperlipidemia   . Hypersomnolent    Previous diagnosis of narcolepsy  . Hypertension, essential   . Ischemic cardiomyopathy    Ejection fraction 15-20% catheterization 2010  . Type II or unspecified type diabetes mellitus without mention of complication, not stated as uncontrolled   . Unspecified gastritis and gastroduodenitis without mention of hemorrhage     Assessment: [name] is a 21y old male, presenting to [facility] ED  for evaluation of continued N/V since 10/5. Pharmacy to dose coumadin for LVAD, recurrent DVT, Factor V Leiden heterozygote. INR Goal 2-3. PTA warfarin regimen 133m daily. INR 2.57 on admit.  10/9: INR this morning bumped 2.82>>3.91 with 356mdose last night. Hgb 10.7 plt stable. No s/sx bleeding.  Goal of Therapy:  INR 2-3 Monitor platelets by anticoagulation protocol: Yes   Plan:  Hold coumadin dose tonight x1 Consider 33m2mose tomorrow Monitor INR at home on Friday  Thank you for involving pharmacy in this patient's care.  DylJanae BridgemanharmD PGY1 Pharmacy Resident Phone: ([phone]/05/2018 8:50 AM

## 2018-06-13 NOTE — Progress Notes (Signed)
Pt returned from HD with no complications. Pt IV removed and vitals taken to prepare for discharge. All discharge teaching provided, and PT transported home via family.

## 2018-06-13 NOTE — Progress Notes (Signed)
LVAD Coordinator Rounding Note:  Pt presented to ED 06/11/18 from dialysis center following clean treatment. Dialysis center states pt N/V and extremely weak. Pt admitted from ED w/elevated WBC at 23 and intractable N/V.  HM III LVAD implanted on 06/12/17 by Dr. Prescott Gum under Destination Therapy criteria due to renal insufficiency. Pump exchange on 03/01/18 for outflow graft twist/obstruction.   Pt states that he is feeling much better today and is ready to go home after dialysis.    Vital signs: Temp: 98.5 HR:  81 Doppler Pressure: 86 Auto BP: 107/92 (98) O2 Sat: 97% on RA Wt: 76.1>78.2kg  LVAD interrogation reveals:  Speed: 5500 Flow: 4.1 Power: 4.4w PI: 3.5 Alarms: none  Events:none Hematocrit: 34 Fixed speed: 5500 Low speed limit: 5200  Drive Line:  Weekly dressing changes. Next dressing change is due 06/18/18.  Labs: LDH trend: 174>150  INR trend: 2.82>3.91  Blood Products: - 05/28/18  2 units PCs     Anticoagulation Plan: - INR Goal: 2.0 - 3.0 - ASA Dose: 81 mg daily  - Heparin gtt until INR therapeutic - per pharmacy  Device: -St Jude dual  -Therapies: on 200 bpm - Monitor: on VT 150 bpm  Adverse Events on VAD: - ESRD post LVAD; started CVVH 06/15/17; HD started 06/20/17 - Hospitalization 11/5 - 07/16/17 > gastroparesis diagnosis after EGD - Hospitalization 11/26 - 08/04/17 > gastroparesis - Hospitalization 1/8 - 09/16/17>gastroparesis - referred to Dr. Corliss Parish at [facility] - 10/31/17>rightupper arm arteriovenous graft with Artegraft; ligation of right first stage brachial vein transposition  - 12/11/13 elective admission for thrombectomy for AV graft in RUA  - 12/20/17 > intractable N/V - 01/19/18 > intractable N/V sent by EMS from dialysis center - 03/01/18 > outflow graft twist/occulsion requiring pump exchange - 05/23/18 > admitted for non-working fistula in RUA - 06/10/18 > admitted for intractable N/V   Plan/Recommendations:  1. Please page VAD coordinator  with any equipment questions or concerns. 2. Pt may discharge home today with home INR check on Friday and follow up in VAD clinic 10/24.  [name] Rockers RN [place] Coordinator  Office: [phone]  24/7 Pager: [phone]

## 2018-06-13 NOTE — Discharge Summary (Signed)
Advanced Heart Failure Team  Discharge Summary   Patient ID: [name] MRN: [id number], DOB/AGE: 53-Oct-1967 53 y.o. Admit date: 06/11/2018 D/C date:     06/13/2018   Primary Discharge Diagnoses:  1. Nausea/Vomiting 2. ESRD On HD M-W-F  3. Chronic Systolic Heart Failure with HMIII LVAD  4. HTN 5. H//O Recurrent DVT 6. DM 7. PAD 8. Atrial Flutter, Paroxysmal  9. CAD 75. ID   Hospital Course:  [name] a 52 y.o.malewith history of CAD s/p anterior MI in 2010 and NSTEMI in 3/18 with DES to pRCA and mLAD and ischemic cardiomyopathy now s/p Heartmate 3 LVAD (had pump kinking with thrombosis and pump replacement) and ESRD.   Admitted with severe gastroparesis and returns with nausea/vomiting/inability to take po. Received IV fluids with severe nausea. After nausea resolved this was stopped.He continued on his home regimen for nausea/vomiting.   He an elevated WBC, 22.9 on admit. Blood cultures obtained had no growth to date as of 10/9. There was no obvious source of infection. WBC went down to 9.7 on the day of discharge.   He had dialysis on the day discharge. INR was running high so he will hold coumadin on 10/9. Plan to check INR on Friday. HF/VAD Pharmacy reviewed and will dose coumadin on Friday. He will continue to be followed closely in the VAD clinic.   1. Nausea and vomiting: Secondary to gastroparesis.  This has been a recurrent problem, has been quite severe.  Re-admitted with nausea/vomiting/inability to take po.  - He is chronically on erythromycin, continue.  - Continue home Marinol, Reglan.  - He continue on  Ativan, Phenergan, Zofran as needed.   - Received IV fluids and later stopped. Plan to d/c today after HD. [name]   2. ESRD with clotted AV graft: Unable to unclot graft in cath lab. S/P AV fistula LUE 9/24. Plan for 2nd stage 6 weeks. He has a palpable thrill at AVF site. Currently dialyzing through tunneled HD cath. - HD MWF, HD today. - Continue  outpatient HD   3. Chronic Systolic Heart Failure: HM3 VAD, St Jude ICD. He had pump replacement due to [facility] outflow tract kinking.   Nausea/vomiting resolved with IV fluids.   - Continue Bidil on non dialysis days. - Continue ASA 81 mg daily.  -Continue coumadin with INR goal 2-3.  4. HTN: Continue Bidil on non-HD days.MAP 80s-90s. Maps stable.  5. h/o recurrent DVTs: Factor V Leiden heterozygote. -Continue coumadin as above. 6. SE:GBTDVVOH home regimen. 7. YWV:PXTGGYIR bilateral disease on ABIs in 8/18. Denies claudication currently, no pedal ulcers.  8. Atrial flutter: Paroxysmal. Remains in NSR. Continue coumadin 9. CAD:No CP - Continue statin and asa.  10. ID: WBCs 22.9 at admission.  Has had no fever, WBCs 9.7 today.  Cultures negative to date, CXR clear. Driveline and HD catheter sites benign. ?Reaction to profuse vomiting.   LVAD INTERROGATION:  HeartMate 3 LVAD:   Flow 3.8 liters/min, speed 5500, power 4.4, PI 3.2.  8 PI events.   Discharge Weight:   Discharge Vitals: Blood pressure (!) 107/92, pulse 80, temperature 98.5 F (36.9 C), temperature source Oral, resp. rate 16, height 5' 8"  (1.727 m), weight 78.2 kg, SpO2 (!) 19 %.  Labs: Lab Results  Component Value Date   WBC 9.7 06/13/2018   HGB 10.7 (L) 06/13/2018   HCT 33.8 (L) 06/13/2018   MCV 89.9 06/13/2018   PLT 199 06/13/2018    Recent Labs  Lab 06/13/18 0236  NA 134*  K 3.9  CL 101  CO2 23  BUN 46*  CREATININE 9.69*  CALCIUM 7.8*  GLUCOSE 94   Lab Results  Component Value Date   CHOL 128 05/04/2017   HDL 50 05/04/2017   LDLCALC 54 05/04/2017   TRIG 118 05/04/2017   BNP (last 3 results) Recent Labs    03/02/18 0400 03/08/18 0008 03/15/18 0106  BNP 694.1* 1,576.6* 811.3*    ProBNP (last 3 results) No results for input(s): PROBNP in the last 8760 hours.   Diagnostic Studies/Procedures   Dg Chest Portable 1 View  Result Date: 06/11/2018 CLINICAL DATA:  Leukocytosis. Abdominal  pain and vomiting. EXAM: PORTABLE CHEST 1 VIEW COMPARISON:  05/27/2018. FINDINGS: Stable enlarged cardiac silhouette, coronary artery stents, LVAD, left subclavian pacer and AICD leads and left jugular double-lumen catheter with its tip added just above the superior cavoatrial junction. Clear lungs with normal vascularity. Unremarkable bones. IMPRESSION: No acute abnormality.  Stable cardiomegaly. Electronically Signed   By: Claudie Revering M.D.   On: 06/11/2018 15:22    Discharge Medications   Allergies as of 06/13/2018      Reactions   Metformin And Related Diarrhea      Medication List    TAKE these medications   aspirin EC 81 MG tablet Take 1 tablet (81 mg total) by mouth daily.   atorvastatin 40 MG tablet Commonly known as:  LIPITOR Take 1 tablet (40 mg total) by mouth daily at 6 PM.   calcitRIOL 0.5 MCG capsule Commonly known as:  ROCALTROL Take 1 capsule (0.5 mcg total) by mouth every Monday, Wednesday, and Friday.   citalopram 20 MG tablet Commonly known as:  CELEXA Take 1 tablet (20 mg total) by mouth daily.   dronabinol 2.5 MG capsule Commonly known as:  MARINOL Take 1 capsule (2.5 mg total) by mouth 2 (two) times daily before lunch and supper.   erythromycin 250 MG tablet Commonly known as:  E-MYCIN Take 1 tablet (250 mg total) by mouth 3 (three) times daily.   feeding supplement (NEPRO CARB STEADY) Liqd Take 237 mLs by mouth 2 (two) times daily between meals.   insulin aspart 100 UNIT/ML injection Commonly known as:  novoLOG Inject 5 Units into the skin 3 (three) times daily with meals.   insulin glargine 100 UNIT/ML injection Commonly known as:  LANTUS Inject 0.1 mLs (10 Units total) into the skin daily.   isosorbide-hydrALAZINE 20-37.5 MG tablet Commonly known as:  BIDIL Take 1.5 tablets by mouth every 8 (eight) hours. Take 1.5 tablets every 8 hours on non dialysis days. What changed:  when to take this   LORazepam 0.5 MG tablet Commonly known as:   ATIVAN Take 1 tablet (0.5 mg total) by mouth every 12 (twelve) hours as needed (nausea).   metoCLOPramide 10 MG tablet Commonly known as:  REGLAN Take 1 tablet (10 mg total) by mouth 3 (three) times daily.   multivitamin Tabs tablet Take 1 tablet by mouth daily.   ondansetron 4 MG disintegrating tablet Commonly known as:  ZOFRAN-ODT Take 1 tablet (4 mg total) by mouth every 6 (six) hours as needed for nausea or vomiting.   pantoprazole 40 MG tablet Commonly known as:  PROTONIX Take 40 mg by mouth 2 (two) times daily.   promethazine 12.5 MG tablet Commonly known as:  PHENERGAN Take 1 tablet (12.5 mg total) by mouth every 6 (six) hours as needed for nausea or vomiting.   warfarin 5 MG tablet Commonly known as:  COUMADIN Take as directed. If you are unsure how to take this medication, talk to your nurse or doctor. Original instructions:  Take 1 tablet (5 mg total) by mouth daily at 6 PM. INR check 10/11 with dose per HF Pharmacy. Start taking on:  06/15/2018 What changed:    additional instructions  These instructions start on 06/15/2018. If you are unsure what to do until then, ask your doctor or other care provider.       Disposition   The patient will be discharged in stable condition to home. Discharge Instructions    Diet - low sodium heart healthy   Complete by:  As directed    Heart Failure patients record your daily weight using the same scale at the same time of day   Complete by:  As directed    INR  Goal: 2 - 3   Complete by:  As directed    Goal:  2 - 3   Increase activity slowly   Complete by:  As directed    Page VAD Coordinator at [phone]  Notify for: any VAD alarms, sustained elevations of power >10 watts, sustained drop in Pulse Index <3   Complete by:  As directed    Notify for:   any VAD alarms sustained elevations of power >10 watts sustained drop in Pulse Index <3     Speed Settings:   Complete by:  As directed    Fixed 5500RPM Low 5200  RPM     Follow-up Information    Larey Dresser, MD Follow up on 06/28/2018.   Specialty:  Cardiology Why:  at [place] information: [place] [address] [phone]             Duration of Discharge Encounter: Greater than 35 minutes   Signed, Elysia Grand NP-C  06/13/2018, 10:06 AM

## 2018-06-13 NOTE — Progress Notes (Signed)
Patient ID: [name], male   DOB: 12-17-1965, 52 y.o.   MRN: [id number]   Advanced Heart Failure VAD Team Note  PCP-Cardiologist: No primary care provider on file.   Subjective:    Patient feels like he is back to normal today.  No nausea/vomiting.  Hungry for breakfast.  No abdominal pain.  MAP 80s-90s.    LVAD INTERROGATION:  HeartMate 3 LVAD:   Flow 3.8 liters/min, speed 5500, power 4.4, PI 3.2.  8 PI events.    Objective:    Vital Signs:   Temp:  [98.1 F (36.7 C)-99.8 F (37.7 C)] 98.5 F (36.9 C) (10/09 0330) Pulse Rate:  [80-102] 80 (10/09 0330) Resp:  [14-20] 16 (10/09 0330) BP: (77-145)/(67-116) 107/92 (10/09 0335) SpO2:  [19 %-100 %] 19 % (10/08 2300) Weight:  [78.2 kg] 78.2 kg (10/09 0330)   Mean arterial Pressure 80s-90s this morning  Intake/Output:   Intake/Output Summary (Last 24 hours) at 06/13/2018 0814 Last data filed at 06/13/2018 0200 Gross per 24 hour  Intake 1841.15 ml  Output -  Net 1841.15 ml     Physical Exam    General: NAD Neck: No JVD, no thyromegaly or thyroid nodule.  Lungs: Clear to auscultation bilaterally with normal respiratory effort. CV: Nondisplaced PMI.  Heart regular S1/S2, no S3/S4, no murmur.  No peripheral edema.  No carotid bruit.  Normal pedal pulses.  Abdomen: Soft, nontender, no hepatosplenomegaly, no distention.  Skin: Intact without lesions or rashes.  Neurologic: Alert and oriented x 3.  Psych: Normal affect. Extremities: No clubbing or cyanosis.  HEENT: Normal.    Telemetry   NSR 80s (personally reviewed)  Labs   Basic Metabolic Panel: Recent Labs  Lab 06/11/18 1242 06/12/18 0352 06/13/18 0236  NA 135 136 134*  K 4.2 4.4 3.9  CL 95* 99 101  CO2 23 22 23   GLUCOSE 114* 75 94  BUN 28* 35* 46*  CREATININE 6.22* 7.90* 9.69*  CALCIUM 8.7* 8.5* 7.8*    Liver Function Tests: No results for input(s): AST, ALT, ALKPHOS, BILITOT, PROT, ALBUMIN in the last 168 hours. No results for input(s):  LIPASE, AMYLASE in the last 168 hours. No results for input(s): AMMONIA in the last 168 hours.  CBC: Recent Labs  Lab 06/11/18 1242 06/12/18 0352 06/13/18 0236  WBC 22.9* 10.9* 9.7  NEUTROABS 20.2*  --   --   HGB 13.6 13.0 10.7*  HCT 42.9 41.1 33.8*  MCV 91.3 90.3 89.9  PLT 228 200 199    INR: Recent Labs  Lab 06/11/18 1303 06/12/18 0352 06/13/18 0236  INR 2.57 2.82 3.91    Other results:  EKG:    Imaging   Dg Chest Portable 1 View  Result Date: 06/11/2018 CLINICAL DATA:  Leukocytosis. Abdominal pain and vomiting. EXAM: PORTABLE CHEST 1 VIEW COMPARISON:  05/27/2018. FINDINGS: Stable enlarged cardiac silhouette, coronary artery stents, LVAD, left subclavian pacer and AICD leads and left jugular double-lumen catheter with its tip added just above the superior cavoatrial junction. Clear lungs with normal vascularity. Unremarkable bones. IMPRESSION: No acute abnormality.  Stable cardiomegaly. Electronically Signed   By: Claudie Revering M.D.   On: 06/11/2018 15:22     Medications:     Scheduled Medications: . aspirin EC  81 mg Oral Daily  . atorvastatin  40 mg Oral q1800  . calcitRIOL  0.5 mcg Oral Q M,W,F  . Chlorhexidine Gluconate Cloth  6 each Topical Q0600  . citalopram  20 mg Oral Daily  .  dronabinol  2.5 mg Oral BID AC  . erythromycin  250 mg Oral TID  . feeding supplement (NEPRO CARB STEADY)  237 mL Oral BID BM  . feeding supplement (PRO-STAT SUGAR FREE 64)  30 mL Oral TID BM  . insulin aspart  0-5 Units Subcutaneous QHS  . insulin aspart  0-9 Units Subcutaneous TID WC  . insulin aspart  3 Units Subcutaneous TID WC  . isosorbide-hydrALAZINE  1.5 tablet Oral 3 times per day on Sun Tue Thu Sat  . metoCLOPramide  10 mg Oral TID  . multivitamin  1 tablet Oral Daily  . pantoprazole  40 mg Oral BID  . Warfarin - Pharmacist Dosing Inpatient   Does not apply q1800    Infusions: . sodium chloride    . sodium chloride 100 mL/hr at 06/13/18 0200    PRN  Medications: acetaminophen, docusate sodium, hydrALAZINE, LORazepam, ondansetron (ZOFRAN) IV, ondansetron, promethazine   Patient Profile   [name] a 52 y.o.malewith history of CAD s/p anterior MI in 2010 and NSTEMI in 3/18 with DES to pRCA and mLAD and ischemic cardiomyopathy now s/p Heartmate 3 LVAD (had pump kinking with thrombosis and pump replacement) and ESRD. He has severe gastroparesis and returns with nausea/vomiting/inability to take po.   Assessment/Plan:    1. Nausea and vomiting: Secondary to gastroparesis.  This has been a recurrent problem, has been quite severe.  Re-admitted with nausea/vomiting/inability to take po. He is better this morning, wants to eat breakfast.  - He is chronically on erythromycin, continue.  - Continue home Marinol, Reglan.  - He can get Ativan, Phenergan, Zofran as needed.   - Stop IV fluid, he can go home today after HD.   2. ESRD with clotted AV graft: Unable to unclot graft in cath lab. S/P AV fistula LUE 9/24. Plan for 2nd stage 6 weeks. He has a palpable thrill at AVF site. Currently dialyzing through tunneled HD cath. - HD MWF, HD today.  3. Chronic Systolic Heart Failure: HM3 VAD, St Jude ICD.  He had pump replacement due to [facility] outflow tract kinking.  Got IVF when not taking po.  Nausea/vomiting resolved, plans to eat breakfast.  - can stop IV fluid.  - Continue Bidil on non dialysis days. BP better-controlled this morning.  - Continue ASA 81 mg daily.  - Continue coumadin with INR goal 2-3.  4. HTN: Continue Bidil on non-HD days.MAP 80s-90s.  5. h/o recurrent DVTs: Factor V Leiden heterozygote. - Continue coumadin as above.  6. HW:EXHBZJIR home regimen. 7. CVE:LFYBOFBP bilateral disease on ABIs in 8/18. Denies claudication currently, no pedal ulcers.  8. Atrial flutter: Paroxysmal. Remains in NSR. Continue coumadin 9. CAD:No CP - Continue statin and asa.  10. ID: WBCs 22.9 at admission.  Has had no fever, WBCs  9.7 today.  Cultures negative to date, CXR clear. Driveline and HD catheter sites benign. ?Reaction to profuse vomiting.  11. Disposition: He can go home today after HD, continue his previous medication regimen and followup in LVAD clinic.   I reviewed the LVAD parameters from today, and compared the results to the patient's prior recorded data.  No programming changes were made.  The LVAD is functioning within specified parameters.  The patient performs LVAD self-test daily.  LVAD interrogation was negative for any significant power changes, alarms or PI events/speed drops.  LVAD equipment check completed and is in good working order.  Back-up equipment present.   LVAD education done on emergency procedures and  precautions and reviewed exit site care.  Length of Stay: 2  Loralie Champagne, MD 06/13/2018, 8:14 AM  VAD Team --- VAD ISSUES ONLY--- Pager [phone] (7am - 7am)  Advanced Heart Failure Team  Pager [phone] (M-F; 7a - 4p)  Please contact [facility] for night-coverage after hours (4p -7a ) and weekends on [email or web address]

## 2018-06-14 ENCOUNTER — Encounter (HOSPITAL_COMMUNITY): Payer: Self-pay

## 2018-06-16 LAB — CULTURE, BLOOD (ROUTINE X 2)
Culture: NO GROWTH
SPECIAL REQUESTS: ADEQUATE

## 2018-06-17 LAB — CULTURE, BLOOD (ROUTINE X 2)
Culture: NO GROWTH
Special Requests: ADEQUATE

## 2018-06-18 NOTE — Assessment & Plan Note (Signed)
Check A1c today.

## 2018-06-22 ENCOUNTER — Telehealth (HOSPITAL_COMMUNITY): Payer: Self-pay | Admitting: Pharmacist

## 2018-06-22 ENCOUNTER — Ambulatory Visit (HOSPITAL_COMMUNITY): Payer: Self-pay | Admitting: Pharmacist

## 2018-06-22 DIAGNOSIS — I824Y9 Acute embolism and thrombosis of unspecified deep veins of unspecified proximal lower extremity: Secondary | ICD-10-CM

## 2018-06-22 DIAGNOSIS — Z5181 Encounter for therapeutic drug level monitoring: Secondary | ICD-10-CM

## 2018-06-22 DIAGNOSIS — Z95811 Presence of heart assist device: Secondary | ICD-10-CM

## 2018-06-22 LAB — POCT INR: INR: 1.5 — AB (ref 2.0–3.0)

## 2018-06-22 NOTE — Telephone Encounter (Signed)
Called to remind [name] to check his INR. He assured me he would check today.   Ruta Hinds. Velva Harman, PharmD, BCPS, CPP Clinical Pharmacist Phone: [phone] 06/22/2018 2:58 PM

## 2018-06-25 ENCOUNTER — Encounter (HOSPITAL_COMMUNITY): Payer: Self-pay

## 2018-06-25 ENCOUNTER — Ambulatory Visit (HOSPITAL_COMMUNITY): Payer: Self-pay | Admitting: Pharmacist

## 2018-06-25 DIAGNOSIS — I824Y9 Acute embolism and thrombosis of unspecified deep veins of unspecified proximal lower extremity: Secondary | ICD-10-CM

## 2018-06-25 DIAGNOSIS — Z95811 Presence of heart assist device: Secondary | ICD-10-CM

## 2018-06-25 DIAGNOSIS — Z5181 Encounter for therapeutic drug level monitoring: Secondary | ICD-10-CM

## 2018-06-25 LAB — POCT INR: INR: 2.9 (ref 2.0–3.0)

## 2018-06-28 ENCOUNTER — Encounter (HOSPITAL_COMMUNITY): Payer: Self-pay

## 2018-06-28 ENCOUNTER — Ambulatory Visit (HOSPITAL_COMMUNITY)
Admission: RE | Admit: 2018-06-28 | Discharge: 2018-06-28 | Disposition: A | Discharge status: Home / Self Care | Payer: BLUE CROSS/BLUE SHIELD | Source: Ambulatory Visit | Attending: Internal Medicine | Admitting: Internal Medicine

## 2018-06-28 VITALS — BP 85/63 | HR 86 | Ht 68.0 in | Wt 180.4 lb

## 2018-06-28 DIAGNOSIS — Z9049 Acquired absence of other specified parts of digestive tract: Secondary | ICD-10-CM | POA: Diagnosis not present

## 2018-06-28 DIAGNOSIS — Z86718 Personal history of other venous thrombosis and embolism: Secondary | ICD-10-CM | POA: Insufficient documentation

## 2018-06-28 DIAGNOSIS — I4892 Unspecified atrial flutter: Secondary | ICD-10-CM | POA: Diagnosis not present

## 2018-06-28 DIAGNOSIS — Z794 Long term (current) use of insulin: Secondary | ICD-10-CM | POA: Insufficient documentation

## 2018-06-28 DIAGNOSIS — K5090 Crohn's disease, unspecified, without complications: Secondary | ICD-10-CM | POA: Diagnosis not present

## 2018-06-28 DIAGNOSIS — N186 End stage renal disease: Secondary | ICD-10-CM

## 2018-06-28 DIAGNOSIS — E1143 Type 2 diabetes mellitus with diabetic autonomic (poly)neuropathy: Secondary | ICD-10-CM | POA: Diagnosis not present

## 2018-06-28 DIAGNOSIS — I132 Hypertensive heart and chronic kidney disease with heart failure and with stage 5 chronic kidney disease, or end stage renal disease: Secondary | ICD-10-CM | POA: Insufficient documentation

## 2018-06-28 DIAGNOSIS — Z992 Dependence on renal dialysis: Secondary | ICD-10-CM

## 2018-06-28 DIAGNOSIS — I2510 Atherosclerotic heart disease of native coronary artery without angina pectoris: Secondary | ICD-10-CM | POA: Insufficient documentation

## 2018-06-28 DIAGNOSIS — I5022 Chronic systolic (congestive) heart failure: Secondary | ICD-10-CM | POA: Diagnosis not present

## 2018-06-28 DIAGNOSIS — I255 Ischemic cardiomyopathy: Secondary | ICD-10-CM | POA: Diagnosis not present

## 2018-06-28 DIAGNOSIS — Z955 Presence of coronary angioplasty implant and graft: Secondary | ICD-10-CM | POA: Diagnosis not present

## 2018-06-28 DIAGNOSIS — Z95811 Presence of heart assist device: Secondary | ICD-10-CM | POA: Diagnosis not present

## 2018-06-28 DIAGNOSIS — Z7901 Long term (current) use of anticoagulants: Secondary | ICD-10-CM | POA: Insufficient documentation

## 2018-06-28 DIAGNOSIS — E785 Hyperlipidemia, unspecified: Secondary | ICD-10-CM | POA: Insufficient documentation

## 2018-06-28 DIAGNOSIS — K219 Gastro-esophageal reflux disease without esophagitis: Secondary | ICD-10-CM | POA: Insufficient documentation

## 2018-06-28 DIAGNOSIS — Z7982 Long term (current) use of aspirin: Secondary | ICD-10-CM | POA: Insufficient documentation

## 2018-06-28 DIAGNOSIS — D6851 Activated protein C resistance: Secondary | ICD-10-CM | POA: Diagnosis not present

## 2018-06-28 DIAGNOSIS — I252 Old myocardial infarction: Secondary | ICD-10-CM | POA: Insufficient documentation

## 2018-06-28 DIAGNOSIS — E1122 Type 2 diabetes mellitus with diabetic chronic kidney disease: Secondary | ICD-10-CM | POA: Diagnosis not present

## 2018-06-28 DIAGNOSIS — K3184 Gastroparesis: Secondary | ICD-10-CM | POA: Diagnosis not present

## 2018-06-28 DIAGNOSIS — Z79899 Other long term (current) drug therapy: Secondary | ICD-10-CM | POA: Insufficient documentation

## 2018-06-28 NOTE — Patient Instructions (Signed)
1. No change in meds 2. Return to [place] clinic in 2 months.

## 2018-06-28 NOTE — Progress Notes (Addendum)
Patient presents for hospital  follow up in [facility] today with friend. Reports no problems with VAD equipment or concerns with drive line.   He is using cane today and ambulating without difficulty. No c/o SOB or fatigue, says he is feeling good.  Reports "some"  N&V occasionally; remains on erythromycin 250 mg tid. He also is taking his marinol and reglan as ordered. He denies holding any meds, including warfarin. He does not need any refills today.  Pt is happy to report his dialysis times have changed to 6:45 am on MWF. He reports his fistula seems to be maturing according to dialysis staff. Says he will need 2nd surgery in about a month before it is operable.   Pt c/o chest pain yesterday while lying down, acute onset with sharp pain 7-8/10, lasted 2 - 5 mins; subsided on its own. Dr. Aundra Dubin updated, monitor shows NSR today during clinic visit.   Lab results obtained from dialysis center since pt is such a difficult stick.  06/20/18:  WBC 8.18, Hgb 11.8, Hct 35.4, Plts187       Na 142, K+ 4.1, BUN 55 Creat 8.47       LDH 215       Iron 743, Ferretin 743, UIBC 137, TIBC 189, Tranferrin sat 189, Transferrin 142   Vital Signs:  Doppler Pressure 100 Automatc BP:  85/63 (76) HR:  86 SPO2:100% on RA  Weight: 172 lb w/eqip Last weight: 180.4 lb Dry wt: 176 lbs   VAD Indication: Destination therapy- Pt is scheulued for transplant eval at [facility]   VAD interrogation & Equipment Management: Speed:5500 Flow: 4.1 Power:4.5 w    PI: 5.0  Alarms: none Events: 5 - 15 per day  Fixed speed 5500 Low speed limit: 5200  Primary Controller:  Replace back up battery in 27 months. Back up controller:   Replace back up battery in 27 months.  Annual Equipment Maintenance on UBC/PM was performed on 06/2017.   I reviewed the LVAD parameters from today and compared the results to the patient's prior recorded data. LVAD interrogation was NEGATIVE for significant power changes, NEGATIVE  for clinical alarms and STABLE for PI events/speed drops. No programming changes were made and pump is functioning within specified parameters. Pt is performing daily controller and system monitor self tests along with completing weekly and monthly maintenance for LVAD equipment.  LVAD equipment check completed and is in good working order. Back-up equipment present.   Exit Site Care: Drive line is being maintained weekly by his daughter. Sorbaview dressing dry and intact; anchor intact and accurately applied. Provided patient with 8 weekly dressing kits and 4 extra anchors for home use.   Significant Events on VAD Support: -10/2016>> poss drive line infection, CT ABD neg, ID consult-doxy -11/09/16>> admit for poss drive line infection, IV abx -03/24/17>> doxy for poss drive line infection -05/04/17>> drive line debridement with wound-vac -06/2017>> drive line +proteus, IV abx, Bactrim x14 days  Device:St jude Dual Therapies: on 200 bpm VT monitor: on 150 bpm Pacing: DDD 60 Last check: 06/11/18  BP & Labs:  MAP 100 - Doppler is reflecting modified systolic  Hgb 53.7 - No S/S of bleeding. Specifically denies melena/BRBPR or nosebleeds.  LDH 215 and is within established baseline of 245- 350. Denies tea-colored urine. No power elevations noted on interrogation.   3 mo Intermacs follow up completed including:  Quality of Life, KCCQ-12, and Neurocognitive trail making.   Pt unable to attempt 6 min walk; still  requiring cane to walk.    Patient Instructions:  1. No change in meds. 2. Return to [facility] in 2 months.  Zada Girt RN VAD Coordinator   Office: [phone] 24/7 Emergency VAD Pager: [phone]    Cardiology: Dr. Aundra Dubin  HPI: 52 y.o. with history of CAD s/p anterior MI in 2010 and NSTEMI in 3/18 with DES to pRCA and mLAD and ischemic cardiomyopathy now s/p Heartmate 3 LVAD and ESRD presents for followup of CHF/LVAD.  Patient developed low output heart failure.  He  was not deemed to be a good transplant candidate.  He was admitted in 10/18 and Heartmate 3 LVAD was placed.  He had baseline CKD stage 3 likely from diabetic nephropathy and CHF.  He developed peri-op AKI and ended up on dialysis.  No renal recovery to date.  St Jude ICD has been turned back on and ramp echo was done prior to discharge in 10/18, speed increased to 5600 rpm.   He was admitted in 11/18 with nausea/vomiting and abdominal pain.  This appears to have been due to diabetic gastroparesis (extensive GI evaluation). He had atrial flutter this admission that was converted to NSR with amiodarone.  He improved considerably with scheduled Reglan and erythromycin. He was re-admitted later in 11/18 with the same symptoms, suspect diabetic gastroparesis and was again treated with erythromycin.  He was not able to start domperidone due to prolonged QT interval. He had nausea again about a week ago but was able to manage at home with Phenergan suppositories.   Admitted 1/8 -> 09/16/17 for uncontrolled vomiting in setting of diabetic gastroparesis. Improved with phenergan suppositories and given erythromycin TID for 7 days post-discharge.    He was admitted in 2/19 for AV graft placement.  While in the hospital, we turned down his speed.  Since then, he has had considerably fewer PI events on HD days.    He was admitted in 4/19 with nausea/vomiting again, probably gastroparesis flare.   He was admitted in 5/19 with nausea/vomiting, likely gastroparesis flare, treated with erythromycin course which he is finishing.   He has been evaluated at [facility] by Dr. Derrill Kay (gastroparesis specialist).   Gastric emptying study, surprisingly, was near normal. However, electrogastrogram showed poor gastric accomodation/capacity, "bradygastria."  Frequent small meals were recommended. Also, early am nausea was thought to be possible GERD.    Admitted 6/26 - 03/19/18 for emergent VAD exchange 03/01/18 in the setting of  HM3 outflow graft kink. He required CVVHD post op, but was transtioned back to [facility] without problems. Course complicated by leukocytosis and fever. CT abd/chest negative for infection and blood cultures were negative. Post op pain well controlled withraretramadol. Able to tolerate diet. Evaluated by PT/OT and HH PT recommended for discharge. Ramp echo completed prior to discharge with speed optimization. Resumed81 mg ASA + coumadin with INR goal 2-3.  He was admitted 03/23/18 with nausea/gastroparesis symptoms and marked HTN at outpatient HD.  Erythromycin was restarted and gastroparesis symptoms were controlled with fall in BP and discharge on 03/25/18.   He was admitted in 9/19 for LUE AVF placement.  He was readmitted in 10/19 with his typical gastroparesis symptoms, unable to take po.   He returns today for followup of LVAD and CHF.  He has been doing well since discharge.  No further nausea/vomiting.  Taking daily erythromycin.  No dyspnea walking on flat ground.  No lightheadedness.  He is having a lot less PI events at HD.  He had an  episode of atypical chest pain.  Has followup at [facility] for further transplant evaluation in 11/19.     Reports taking Coumadin as prescribed and adherence to anticoagulation based dietary restrictions. Denies BRBPR or melena. No dark urine or hematuria.   Labs (11/18): hgb 10.4 => 8.7, LDH 213 Labs (08/04/17): hgb 9 Labs (1/19): HgbA1c 6.1 Labs (2/19): hgb 13.2 Labs (3/19): hgb 11.1 Labs (7/19): hgb 9.6 Labs (10/19: hgb 11.8  PMH: 1. Chronic systolic CHF: Ischemic cardiomyopathy. St Jude ICD.  - Echo (11/17) with EF 25-30%, moderate diastolic dysfunction, mild MR, mildly dilated RV with moderately decreased systolic function, peak RV-RA gradient 48 mmHg.  - Echo (3/18) with EF 25-30%, wall motion abnormalities, normal RV size and systolic function.  - Admission 3/18 with cardiogenic shock and AKI, required milrinone gtt.  - Echo (8/18) with EF 20-25%, mild  LVH, moderate LV dilation, mild MR.  - CPX (8/18) with peak VO2 12.4, VE/VCO2 slope 35, RER 1.05 => severe HR limitation.  - Heartmate 3 LVAD 10/18.  - Pump thrombosis in setting of HM3 outflow graft kink.  Pump exchange 6/19 for HM3.  2. CAD: Anterior MI 2005, had bifurcation stenting LAD/diagonal. Repeat cath 2010 with patent stents.  - NSTEMI 3/18: LHC with 99% ulcerated lesion proximal RCA, 95% mid LAD => PCI with DES to proximal RCA and mid LAD.  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  - Lower extremity venous dopplers (8/18): chronic DVT - V/Q scan (8/18): Normal, no PE.  4. Crohns disease: History of colectomy.  5. GERD 6. Diabetic gastroparesis: Admission 11/18 with abdominal pain, nausea/vomiting.  - [facility] workup 2019: Gastric emptying study near normal.  Negative breath test (no evidence for small bowel bacterial overgrowth).  Electrogastrogram showed poor gastric accomodation/capacity, "bradygastria."  7. HTN 8. Hyperlipidemia 9. Type II diabetes  10. ESRD: Post-op LVAD surgery.  11. Carotid dopplers (8/18): mild bilateral stenosis.  12. PFTs (8/18): Mild restriction (no IDL on CT chest).  13. Lung nodules: CT chest 8/18 with small bilateral nodules.  14. PAD: ABIs with moderate bilateral disease in 8/18.  15. Atrial flutter: In 11/18 associated with nausea/vomiting from gastroparesis.   Current Outpatient Medications  Medication Sig Dispense Refill  . aspirin EC 81 MG tablet Take 1 tablet (81 mg total) by mouth daily. 90 tablet 3  . atorvastatin (LIPITOR) 40 MG tablet Take 1 tablet (40 mg total) by mouth daily at 6 PM. 30 tablet 6  . calcitRIOL (ROCALTROL) 0.5 MCG capsule Take 1 capsule (0.5 mcg total) by mouth every Monday, Wednesday, and Friday. 15 capsule 5  . citalopram (CELEXA) 20 MG tablet Take 1 tablet (20 mg total) by mouth daily. 30 tablet 6  . dronabinol (MARINOL) 2.5 MG capsule Take 1 capsule (2.5 mg total) by mouth 2 (two) times daily before lunch and supper. 60 capsule 5   . erythromycin (E-MYCIN) 250 MG tablet Take 1 tablet (250 mg total) by mouth 3 (three) times daily. 90 tablet 4  . insulin aspart (NOVOLOG) 100 UNIT/ML injection Inject 5 Units into the skin 3 (three) times daily with meals. 1 vial 0  . insulin glargine (LANTUS) 100 UNIT/ML injection Inject 0.1 mLs (10 Units total) into the skin daily. 10 mL 5  . isosorbide-hydrALAZINE (BIDIL) 20-37.5 MG tablet Take 1.5 tablets by mouth every 8 (eight) hours. Take 1.5 tablets every 8 hours on non dialysis days. (Patient taking differently: Take 1.5 tablets by mouth See admin instructions. Take 1.5 tablets every 8 hours on non  dialysis days.) 135 tablet 6  . LORazepam (ATIVAN) 0.5 MG tablet Take 1 tablet (0.5 mg total) by mouth every 12 (twelve) hours as needed (nausea). 60 tablet 2  . metoCLOPramide (REGLAN) 10 MG tablet Take 1 tablet (10 mg total) by mouth 3 (three) times daily. 90 tablet 6  . multivitamin (RENA-VIT) TABS tablet Take 1 tablet by mouth daily.    . ondansetron (ZOFRAN ODT) 4 MG disintegrating tablet Take 1 tablet (4 mg total) by mouth every 6 (six) hours as needed for nausea or vomiting. 40 tablet 1  . pantoprazole (PROTONIX) 40 MG tablet Take 40 mg by mouth 2 (two) times daily.     . promethazine (PHENERGAN) 12.5 MG tablet Take 1 tablet (12.5 mg total) by mouth every 6 (six) hours as needed for nausea or vomiting. 30 tablet 0  . warfarin (COUMADIN) 5 MG tablet Take 1 tablet (5 mg total) by mouth daily at 6 PM. INR check 10/11 with dose per [facility] Pharmacy. 45 tablet 6  . enoxaparin (LOVENOX) 80 MG/0.8ML injection Inject 75 mg into the skin every 12 (twelve) hours.     No current facility-administered medications for this encounter.     Metformin and related   Review of systems complete and found to be negative unless listed in HPI.    BP (!) 85/63 Comment: MAP 76  Pulse 86   Ht 5' 8"  (1.727 m)   Wt 81.8 kg (180 lb 6.4 oz)   SpO2 100%   BMI 27.43 kg/m  MAP 76  VAD interrogation & Equipment  Management: See nurse's note above.   General: Well appearing this am. NAD.  HEENT: Normal. Neck: Supple, JVP 7-8 cm. Carotids OK.  Cardiac:  Mechanical heart sounds with LVAD hum present.  Lungs:  CTAB, normal effort.  Abdomen:  NT, ND, no HSM. No bruits or masses. +BS  LVAD exit site: Well-healed and incorporated. Dressing dry and intact. No erythema or drainage. Stabilization device present and accurately applied. Driveline dressing changed daily per sterile technique. Extremities:  Warm and dry. No cyanosis, clubbing, rash, or edema.  Neuro:  Alert & oriented x 3. Cranial nerves grossly intact. Moves all 4 extremities w/o difficulty. Affect pleasant    ASSESSMENT AND PLAN: 1. Chronic systolic CHF: Ischemic cardiomyopathy.  St Jude ICD.  NSTEMI in March 2018 with DES to LAD and RCA, complicated by cardiogenic shock, low output requiring milrinone.  Echo 8/18 with EF 20-25%, moderate MR.  CPX (8/18) with severe functional impairment due to HF.  He is s/p HM3 LVAD placement. He had pump thrombosis 6/19 due to [facility] outflow graft kinking.  He had pump exchange for another HM3.  LVAD parameters reviewed today and stable.  No complaints. MAP controlled. NYHA II. Volume status stable on exam.   - Continue Bidil 1.5 tab tid on non-HD days.    - Continue ASA 81.  Continue warfarin INR 2-3.    - He has been referred to [facility] for evaluation for heart/kidney transplant, he will go back to [facility] in November.  2. ESRD: Developed peri-operatively after HM3 placement.  He is tolerating HD, currently via catheter but has new maturing AV fistula. 3. CAD: NSTEMI in 3/18. LHC with 99% ulcerated lesion proximal RCA with left to right collaterals, 95% mid LAD stenosis after mid LAD stent. s/p PCI to RCA and LAD on 11/25/16.   - Continue statin and ASA. 4. h/o DVTs: Factor V Leiden heterozygote. - On coumadin. 5. Diabetic gastroparesis: Difficult to  manage. He has seen gastroparesis specialist at [facility].  Surprisingly, gastric emptying study was normal.  However, electrogastrogram showed poor gastric accomodation/capacity.  Therefore, small frequent meals recommended.  Also morning nausea thought to be GERD and Protonix was increased.  Nausea seems to drive markedly high BP (resolves with nausea control).  - Continue reglan 5 mg tid/ac and Marinol.  - Continue Protonix bid.  - Make effort to eat small, more frequent meals.  - He will continue erythromycin.  6. DM: HgbA1c well-controlled recently.   7. HTN: MAP controlled on current meds today.  HTN seems to be driven by nausea (see above), so will try to minimize nausea.  8. PAD: Moderate bilateral disease on ABIs in 8/18.  Denies claudication currently, no pedal ulcers.  9. Atrial flutter: Paroxysmal.  He has been in NSR recently.   Loralie Champagne, MD  06/28/2018

## 2018-07-02 ENCOUNTER — Telehealth: Payer: Self-pay | Admitting: Cardiology

## 2018-07-02 ENCOUNTER — Ambulatory Visit (INDEPENDENT_AMBULATORY_CARE_PROVIDER_SITE_OTHER): Payer: BLUE CROSS/BLUE SHIELD | Admitting: *Deleted

## 2018-07-02 DIAGNOSIS — I5022 Chronic systolic (congestive) heart failure: Secondary | ICD-10-CM | POA: Diagnosis not present

## 2018-07-02 DIAGNOSIS — I10 Essential (primary) hypertension: Secondary | ICD-10-CM

## 2018-07-02 NOTE — Telephone Encounter (Signed)
Spoke with pt and reminded pt of remote transmission that is due today. Pt verbalized understanding.   

## 2018-07-03 NOTE — Progress Notes (Signed)
Remote ICD transmission.   

## 2018-07-04 ENCOUNTER — Telehealth (HOSPITAL_COMMUNITY): Payer: Self-pay | Admitting: *Deleted

## 2018-07-04 NOTE — Telephone Encounter (Signed)
Left voicemail requesting patient to check home INR.   [name] RN [place] Coordinator  Office: [phone]  24/7 Pager: [phone]

## 2018-07-06 ENCOUNTER — Ambulatory Visit (HOSPITAL_COMMUNITY): Payer: Self-pay | Admitting: Pharmacist

## 2018-07-06 DIAGNOSIS — I824Y9 Acute embolism and thrombosis of unspecified deep veins of unspecified proximal lower extremity: Secondary | ICD-10-CM

## 2018-07-06 DIAGNOSIS — Z5181 Encounter for therapeutic drug level monitoring: Secondary | ICD-10-CM

## 2018-07-06 DIAGNOSIS — Z95811 Presence of heart assist device: Secondary | ICD-10-CM

## 2018-07-06 LAB — POCT INR: INR: 1.5 — AB (ref 2.0–3.0)

## 2018-07-06 MED ORDER — PROMETHAZINE HCL 12.5 MG PO TABS: 12.5000 mg | ORAL_TABLET | Freq: Four times a day (QID) | ORAL | 0 refills | Status: DC | PRN

## 2018-07-06 MED ORDER — ENOXAPARIN SODIUM 80 MG/0.8ML ~~LOC~~ SOLN: 75.0000 mg | Freq: Two times a day (BID) | SUBCUTANEOUS | 1 refills | Status: DC

## 2018-07-09 ENCOUNTER — Ambulatory Visit (HOSPITAL_COMMUNITY): Payer: Self-pay | Admitting: Pharmacist

## 2018-07-09 DIAGNOSIS — Z95811 Presence of heart assist device: Secondary | ICD-10-CM

## 2018-07-09 DIAGNOSIS — I824Y9 Acute embolism and thrombosis of unspecified deep veins of unspecified proximal lower extremity: Secondary | ICD-10-CM

## 2018-07-09 DIAGNOSIS — Z5181 Encounter for therapeutic drug level monitoring: Secondary | ICD-10-CM

## 2018-07-09 LAB — POCT INR: INR: 2.8 (ref 2.0–3.0)

## 2018-07-10 ENCOUNTER — Ambulatory Visit (INDEPENDENT_AMBULATORY_CARE_PROVIDER_SITE_OTHER): Payer: BLUE CROSS/BLUE SHIELD | Admitting: Podiatry

## 2018-07-10 ENCOUNTER — Encounter: Payer: Self-pay | Admitting: Podiatry

## 2018-07-10 VITALS — BP 140/105 | HR 71

## 2018-07-10 DIAGNOSIS — B351 Tinea unguium: Secondary | ICD-10-CM

## 2018-07-10 DIAGNOSIS — M79675 Pain in left toe(s): Secondary | ICD-10-CM | POA: Diagnosis not present

## 2018-07-10 DIAGNOSIS — E1122 Type 2 diabetes mellitus with diabetic chronic kidney disease: Secondary | ICD-10-CM

## 2018-07-10 DIAGNOSIS — N186 End stage renal disease: Secondary | ICD-10-CM | POA: Diagnosis not present

## 2018-07-10 DIAGNOSIS — M79674 Pain in right toe(s): Secondary | ICD-10-CM

## 2018-07-10 DIAGNOSIS — Z992 Dependence on renal dialysis: Secondary | ICD-10-CM

## 2018-07-10 NOTE — Patient Instructions (Addendum)
Diabetes and Foot Care Diabetes may cause you to have problems because of poor blood supply (circulation) to your feet and legs. This may cause the skin on your feet to become thinner, break easier, and heal more slowly. Your skin may become dry, and the skin may peel and crack. You may also have nerve damage in your legs and feet causing decreased feeling in them. You may not notice minor injuries to your feet that could lead to infections or more serious problems. Taking care of your feet is one of the most important things you can do for yourself. Follow these instructions at home:  Wear shoes at all times, even in the house. Do not go barefoot. Bare feet are easily injured.  Check your feet daily for blisters, cuts, and redness. If you cannot see the bottom of your feet, use a mirror or ask someone for help.  Wash your feet with warm water (do not use hot water) and mild soap. Then pat your feet and the areas between your toes until they are completely dry. Do not soak your feet as this can dry your skin.  Apply a moisturizing lotion or petroleum jelly (that does not contain alcohol and is unscented) to the skin on your feet and to dry, brittle toenails. Do not apply lotion between your toes.  Trim your toenails straight across. Do not dig under them or around the cuticle. File the edges of your nails with an emery board or nail file.  Do not cut corns or calluses or try to remove them with medicine.  Wear clean socks or stockings every day. Make sure they are not too tight. Do not wear knee-high stockings since they may decrease blood flow to your legs.  Wear shoes that fit properly and have enough cushioning. To break in new shoes, wear them for just a few hours a day. This prevents you from injuring your feet. Always look in your shoes before you put them on to be sure there are no objects inside.  Do not cross your legs. This may decrease the blood flow to your feet.  If you find a  minor scrape, cut, or break in the skin on your feet, keep it and the skin around it clean and dry. These areas may be cleansed with mild soap and water. Do not cleanse the area with peroxide, alcohol, or iodine.  When you remove an adhesive bandage, be sure not to damage the skin around it.  If you have a wound, look at it several times a day to make sure it is healing.  Do not use heating pads or hot water bottles. They may burn your skin. If you have lost feeling in your feet or legs, you may not know it is happening until it is too late.  Make sure your health care provider performs a complete foot exam at least annually or more often if you have foot problems. Report any cuts, sores, or bruises to your health care provider immediately. Contact a health care provider if:  You have an injury that is not healing.  You have cuts or breaks in the skin.  You have an ingrown nail.  You notice redness on your legs or feet.  You feel burning or tingling in your legs or feet.  You have pain or cramps in your legs and feet.  Your legs or feet are numb.  Your feet always feel cold. Get help right away if:  There is increasing  redness, swelling, or pain in or around a wound.  There is a red line that goes up your leg.  Pus is coming from a wound.  You develop a fever or as directed by your health care provider.  You notice a bad smell coming from an ulcer or wound. This information is not intended to replace advice given to you by your health care provider. Make sure you discuss any questions you have with your health care provider. Document Released: 08/19/2000 Document Revised: 01/28/2016 Document Reviewed: 01/29/2013 Elsevier Interactive Patient Education  2017 [place].  Fungal Nail Infection Fungal nail infection is a common fungal infection of the toenails or fingernails. This condition affects toenails more often than fingernails. More than one nail may be infected. The  condition can be passed from person to person (is contagious). What are the causes? This condition is caused by a fungus. Several types of funguses can cause the infection. These funguses are common in moist and warm areas. If your hands or feet come into contact with the fungus, it may get into a crack in your fingernail or toenail and cause the infection. What increases the risk? The following factors may make you more likely to develop this condition:  Being male.  Having diabetes.  Being of older age.  Living with someone who has the fungus.  Walking barefoot in areas where the fungus thrives, such as showers or locker rooms.  Having poor circulation.  Wearing shoes and socks that cause your feet to sweat.  Having athlete's foot.  Having a nail injury or history of a recent nail surgery.  Having psoriasis.  Having a weak body defense system (immune system).  What are the signs or symptoms? Symptoms of this condition include:  A pale spot on the nail.  Thickening of the nail.  A nail that becomes yellow or brown.  A brittle or ragged nail edge.  A crumbling nail.  A nail that has lifted away from the nail bed.  How is this diagnosed? This condition is diagnosed with a physical exam. Your health care provider may take a scraping or clipping from your nail to test for the fungus. How is this treated? Mild infections do not need treatment. If you have significant nail changes, treatment may include:  Oral antifungal medicines. You may need to take the medicine for several weeks or several months, and you may not see the results for a long time. These medicines can cause side effects. Ask your health care provider what problems to watch for.  Antifungal nail polish and nail cream. These may be used along with oral antifungal medicines.  Laser treatment of the nail.  Surgery to remove the nail. This may be needed for the most severe infections.  Treatment takes a  long time, and the infection may come back. Follow these instructions at home: Medicines  Take or apply over-the-counter and prescription medicines only as told by your health care provider.  Ask your health care provider about using over-the-counter mentholated ointment on your nails. Lifestyle   Do not share personal items, such as towels or nail clippers.  Trim your nails often.  Wash and dry your hands and feet every day.  Wear absorbent socks, and change your socks frequently.  Wear shoes that allow air to circulate, such as sandals or canvas tennis shoes. Throw out old shoes.  Wear rubber gloves if you are working with your hands in wet areas.  Do not walk barefoot in  shower rooms or locker rooms.  Do not use a nail salon that does not use clean instruments.  Do not use artificial nails. General instructions  Keep all follow-up visits as told by your health care provider. This is important.  Use antifungal foot powder on your feet and in your shoes. Contact a health care provider if: Your infection is not getting better or it is getting worse after several months. This information is not intended to replace advice given to you by your health care provider. Make sure you discuss any questions you have with your health care provider. Document Released: 08/19/2000 Document Revised: 01/28/2016 Document Reviewed: 02/23/2015 Elsevier Interactive Patient Education  2018 [name].  Diabetic Neuropathy Diabetic neuropathy is a nerve disease or nerve damage that is caused by diabetes mellitus. About half of all people with diabetes mellitus have some form of nerve damage. Nerve damage is more common in those who have had diabetes mellitus for many years and who generally have not had good control of their blood sugar (glucose) level. Diabetic neuropathy is a common complication of diabetes mellitus. There are three common types of diabetic neuropathy and a fourth type that is  less common and less understood:  Peripheral neuropathy-This is the most common type of diabetic neuropathy. It causes damage to the nerves of the feet and legs first and then eventually the hands and arms. The damage affects the ability to sense touch.  Autonomic neuropathy-This type causes damage to the autonomic nervous system, which controls the following functions: ? Heartbeat. ? Body temperature. ? Blood pressure. ? Urination. ? Digestion. ? Sweating. ? Sexual function.  Focal neuropathy-Focal neuropathy can be painful and unpredictable and occurs most often in older adults with diabetes mellitus. It involves a specific nerve or one area and often comes on suddenly. It usually does not cause long-term problems.  Radiculoplexus neuropathy- Sometimes called lumbosacral radiculoplexus neuropathy, radiculoplexus neuropathy affects the nerves of the thighs, hips, buttocks, or legs. It is more common in people with type 2 diabetes mellitus and in older men. It is characterized by debilitating pain, weakness, and atrophy, usually in the thigh muscles.  What are the causes? The cause of peripheral, autonomic, and focal neuropathies is diabetes mellitus that is uncontrolled and high glucose levels. The cause of radiculoplexus neuropathy is unknown. However, it is thought to be caused by inflammation related to uncontrolled glucose levels. What are the signs or symptoms? Peripheral Neuropathy Peripheral neuropathy develops slowly over time. When the nerves of the feet and legs no longer work there may be:  Burning, stabbing, or aching pain in the legs or feet.  Inability to feel pressure or pain in your feet. This can lead to: ? Thick calluses over pressure areas. ? Pressure sores. ? Ulcers.  Foot deformities.  Reduced ability to feel temperature changes.  Muscle weakness.  Autonomic Neuropathy The symptoms of autonomic neuropathy vary depending on which nerves are affected. Symptoms  may include:  Problems with digestion, such as: ? Feeling sick to your stomach (nausea). ? Vomiting. ? Bloating. ? Constipation. ? Diarrhea. ? Abdominal pain.  Difficulty with urination. This occurs if you lose your ability to sense when your bladder is full. Problems include: ? Urine leakage (incontinence). ? Inability to empty your bladder completely (retention).  Rapid or irregular heartbeat (palpitations).  Blood pressure drops when you stand up (orthostatic hypotension). When you stand up you may feel: ? Dizzy. ? Weak. ? Faint.  In men, inability to attain and  maintain an erection.  In women, vaginal dryness and problems with decreased sexual desire and arousal.  Problems with body temperature regulation.  Increased or decreased sweating.  Focal Neuropathy  Abnormal eye movements or abnormal alignment of both eyes.  Weakness in the wrist.  Foot drop. This results in an inability to lift the foot properly and abnormal walking or foot movement.  Paralysis on one side of your face (Bell palsy).  Chest or abdominal pain. Radiculoplexus Neuropathy  Sudden, severe pain in your hip, thigh, or buttocks.  Weakness and wasting of thigh muscles.  Difficulty rising from a seated position.  Abdominal swelling.  Unexplained weight loss (usually more than 10 lb [4.5 kg]). How is this diagnosed? Peripheral Neuropathy Your senses may be tested. Sensory function testing can be done with:  A light touch using a monofilament.  A vibration with tuning fork.  A sharp sensation with a pin prick.  Other tests that can help diagnose neuropathy are:  Nerve conduction velocity. This test checks the transmission of an electrical current through a nerve.  Electromyography. This shows how muscles respond to electrical signals transmitted by nearby nerves.  Quantitative sensory testing. This is used to assess how your nerves respond to vibrations and changes in  temperature.  Autonomic Neuropathy Diagnosis is often based on reported symptoms. Tell your health care provider if you experience:  Dizziness.  Constipation.  Diarrhea.  Inappropriate urination or inability to urinate.  Inability to get or maintain an erection.  Tests that may be done include:  Electrocardiography or Holter monitor. These are tests that can help show problems with the heart rate or heart rhythm.  An X-ray exam may be done.  Focal Neuropathy Diagnosis is made based on your symptoms and what your health care provider finds during your exam. Other tests may be done. They may include:  Nerve conduction velocities. This checks the transmission of electrical current through a nerve.  Electromyography. This shows how muscles respond to electrical signals transmitted by nearby nerves.  Quantitative sensory testing. This test is used to assess how your nerves respond to vibration and changes in temperature.  Radiculoplexus Neuropathy  Often the first thing is to eliminate any other issue or problems that might be the cause, as there is no standard test for diagnosis.  X-ray exam of your spine and lumbar region.  Spinal tap to rule out cancer.  MRI to rule out other lesions. How is this treated? Once nerve damage occurs, it cannot be reversed. The goal of treatment is to keep the disease or nerve damage from getting worse and affecting more nerve fibers. Controlling your blood glucose level is the key. Most people with radiculoplexus neuropathy see at least a partial improvement over time. You will need to keep your blood glucose and HbA1c levels in the target range determined by your health care provider. Things that help control blood glucose levels include:  Blood glucose monitoring.  Meal planning.  Physical activity.  Diabetes medicine.  Over time, maintaining lower blood glucose levels helps lessen symptoms. Sometimes, prescription pain medicine is  needed. Follow these instructions at home:  Do not smoke.  Keep your blood glucose level in the range that you and your health care provider have determined acceptable for you.  Keep your blood pressure level in the range that you and your health care provider have determined acceptable for you.  Eat a well-balanced diet.  Be physically active every day. Include strength training and balance exercises.  Protect your feet. ? Check your feet every day for sores, cuts, blisters, or signs of infection. ? Wear padded socks and supportive shoes. Use orthotic inserts, if necessary. ? Regularly check the insides of your shoes for worn spots. Make sure there are no rocks or other items inside your shoes before you put them on. Contact a health care provider if:  You have burning, stabbing, or aching pain in the legs or feet.  You are unable to feel pressure or pain in your feet.  You develop problems with digestion such as: ? Nausea. ? Vomiting. ? Bloating. ? Constipation. ? Diarrhea. ? Abdominal pain.  You have difficulty with urination, such as: ? Incontinence. ? Retention.  You have palpitations.  You develop orthostatic hypotension. When you stand up you may feel: ? Dizzy. ? Weak. ? Faint.  You cannot attain and maintain an erection (in men).  You have vaginal dryness and problems with decreased sexual desire and arousal (in women).  You have severe pain in your thighs, legs, or buttocks.  You have unexplained weight loss. This information is not intended to replace advice given to you by your health care provider. Make sure you discuss any questions you have with your health care provider. Document Released: 10/31/2001 Document Revised: 01/28/2016 Document Reviewed: 01/31/2013 Elsevier Interactive Patient Education  2017 [name].

## 2018-07-13 ENCOUNTER — Ambulatory Visit (INDEPENDENT_AMBULATORY_CARE_PROVIDER_SITE_OTHER): Payer: Self-pay | Admitting: Physician Assistant

## 2018-07-13 ENCOUNTER — Encounter: Payer: Self-pay | Admitting: *Deleted

## 2018-07-13 ENCOUNTER — Ambulatory Visit (HOSPITAL_COMMUNITY)
Admission: RE | Admit: 2018-07-13 | Discharge: 2018-07-13 | Disposition: A | Discharge status: Home / Self Care | Payer: BLUE CROSS/BLUE SHIELD | Source: Ambulatory Visit | Attending: Vascular Surgery | Admitting: Vascular Surgery

## 2018-07-13 ENCOUNTER — Other Ambulatory Visit: Payer: Self-pay

## 2018-07-13 ENCOUNTER — Encounter: Payer: Self-pay | Admitting: Physician Assistant

## 2018-07-13 VITALS — BP 100/68 | HR 152 | Temp 98.5°F | Resp 16 | Ht 67.0 in | Wt 176.4 lb

## 2018-07-13 DIAGNOSIS — Z992 Dependence on renal dialysis: Secondary | ICD-10-CM

## 2018-07-13 DIAGNOSIS — N186 End stage renal disease: Secondary | ICD-10-CM | POA: Diagnosis present

## 2018-07-13 NOTE — Progress Notes (Signed)
    Postoperative Access Visit   History of Present Illness   [name] is a 52 y.o. year old male who presents for postoperative follow-up for: left first stage basilic vein transposition by Dr. Donzetta Matters (Date: 05/29/18).  The patient's wounds are healed.  The patient denies steal symptoms.  The patient is able to complete their activities of daily living.  PMH also significant for CHF on coumadin for LVAD.  Prior access surgeries include a failed R arm fistula as well as right arm graft.  He is dialyzing on a MWF schedule without complication from L IJ TDC.   Physical Examination   Vitals:   07/13/18 1524  BP: 100/68  Pulse: (!) 152  Resp: 16  Temp: 98.5 F (36.9 C)  TempSrc: Oral  SpO2: 98%  Weight: 176 lb 5.9 oz (80 kg)  Height: 5' 7"  (1.702 m)   Body mass index is 27.62 kg/m.  left arm Incision is healed, hand grip is 5/5, sensation in digits is intact, palpable thrill only near [facility] fossa; L radial signal by doppler    Medical Decision Making   [name] is a 52 y.o. year old male who presents s/p left first stage basilic vein transposition   Patent and maturing L arm basilic vein fistula despite area of narrowing in mid upper arm seen on duplex  Images were reviewed with Dr. Donzetta Matters  Plan will be for L arm fistulogram with possible intervention in addition to 2nd stage basilic vein transposition.  This will be performed in the hybrid OR on a non dialysis day  We will coordinate admission to hospital prior to procedure scheduled for 08/09/18 with Heart Failure and LVAD team for transition from coumadin to IV heparin Risk, benefits, and alternatives to access surgery were discussed.   The patient is aware the risks include but are not limited to: bleeding, infection, steal syndrome, thrombosis, and need for additional procedures.   The patient agrees to proceed forward with the procedure.   Dagoberto Ligas PA-C [facility] [place] Office: [phone]

## 2018-07-17 ENCOUNTER — Telehealth: Payer: Self-pay | Admitting: *Deleted

## 2018-07-17 ENCOUNTER — Other Ambulatory Visit: Payer: Self-pay | Admitting: *Deleted

## 2018-07-17 NOTE — Telephone Encounter (Signed)
-----   Message from Willy Eddy, RN sent at 07/17/2018  1:35 PM EST ----- Regarding: FW: Request assistance with patient pending surgery for HD access Contact: [phone]   ----- Message ----- From: Willy Eddy, RN Sent: 07/17/2018  11:01 AM EST To: Larey Dresser, MD, Amy Estrella Deeds, NP, # Subject: Request assistance with patient pending surg#  Dr. Donzetta Matters is requesting your assistance with Anticoagulation management for  [name]. He is scheduled for "second stage basilic vein transposition and fistulogram with possible intervention" for 08/09/2018. Coumadin will need to be held pre-op  The patient is aware he will be contacted by your department for lab work and inpatient management prior to surgery. Again I really appreciate your help and contact me if any further questions. Becky

## 2018-07-18 ENCOUNTER — Ambulatory Visit (HOSPITAL_COMMUNITY): Payer: Self-pay | Admitting: Pharmacist

## 2018-07-18 DIAGNOSIS — Z95811 Presence of heart assist device: Secondary | ICD-10-CM

## 2018-07-18 DIAGNOSIS — I824Y9 Acute embolism and thrombosis of unspecified deep veins of unspecified proximal lower extremity: Secondary | ICD-10-CM

## 2018-07-18 DIAGNOSIS — Z5181 Encounter for therapeutic drug level monitoring: Secondary | ICD-10-CM

## 2018-07-18 LAB — POCT INR: INR: 3.6 — AB (ref 2.0–3.0)

## 2018-07-24 ENCOUNTER — Telehealth: Payer: Self-pay | Admitting: *Deleted

## 2018-07-24 NOTE — Telephone Encounter (Signed)
-----   Message from Lezlie Octave, RN sent at 07/17/2018  3:55 PM EST ----- Regarding: RE: Request assistance with patient pending surgery for HD access Contact: [phone] [place] Guiles,  We will handle admission with heparin bridge. Still need INR 1.5 or less?  Thanks, Cloyde Reams   ----- Message ----- From: Larey Dresser, MD Sent: 07/17/2018   1:33 PM EST To: Mertha Baars, RN, Christinia Gully, RN, # Subject: FW: Request assistance with patient pending #  Can we arrange this? Thanks.   ----- Message ----- From: Willy Eddy, RN Sent: 07/17/2018  11:01 AM EST To: Larey Dresser, MD, Amy Estrella Deeds, NP, # Subject: Request assistance with patient pending surg#  Dr. Donzetta Matters is requesting your assistance with Anticoagulation management for  [place]. He is scheduled for "second stage basilic vein transposition and fistulogram with possible intervention" for 08/09/2018. Coumadin will need to be held pre-op  The patient is aware he will be contacted by your department for lab work and inpatient management prior to surgery. Again I really appreciate your help and contact me if any further questions. Becky

## 2018-07-27 ENCOUNTER — Telehealth (HOSPITAL_COMMUNITY): Payer: Self-pay | Admitting: *Deleted

## 2018-07-27 ENCOUNTER — Other Ambulatory Visit: Payer: Self-pay

## 2018-07-27 ENCOUNTER — Emergency Department (HOSPITAL_COMMUNITY)
Admission: EM | Admit: 2018-07-27 | Discharge: 2018-07-27 | Disposition: A | Discharge status: Home / Self Care | Payer: BLUE CROSS/BLUE SHIELD | Attending: Emergency Medicine | Admitting: Emergency Medicine

## 2018-07-27 ENCOUNTER — Emergency Department (HOSPITAL_COMMUNITY): Payer: BLUE CROSS/BLUE SHIELD

## 2018-07-27 ENCOUNTER — Ambulatory Visit (HOSPITAL_COMMUNITY): Payer: Self-pay | Admitting: *Deleted

## 2018-07-27 ENCOUNTER — Encounter (HOSPITAL_COMMUNITY): Payer: Self-pay | Admitting: Emergency Medicine

## 2018-07-27 DIAGNOSIS — Z992 Dependence on renal dialysis: Secondary | ICD-10-CM

## 2018-07-27 DIAGNOSIS — Z95811 Presence of heart assist device: Secondary | ICD-10-CM

## 2018-07-27 DIAGNOSIS — R079 Chest pain, unspecified: Secondary | ICD-10-CM | POA: Insufficient documentation

## 2018-07-27 DIAGNOSIS — Z5181 Encounter for therapeutic drug level monitoring: Secondary | ICD-10-CM

## 2018-07-27 DIAGNOSIS — K3184 Gastroparesis: Secondary | ICD-10-CM

## 2018-07-27 DIAGNOSIS — Z7901 Long term (current) use of anticoagulants: Secondary | ICD-10-CM | POA: Insufficient documentation

## 2018-07-27 DIAGNOSIS — R112 Nausea with vomiting, unspecified: Secondary | ICD-10-CM | POA: Diagnosis not present

## 2018-07-27 DIAGNOSIS — I5022 Chronic systolic (congestive) heart failure: Secondary | ICD-10-CM | POA: Insufficient documentation

## 2018-07-27 DIAGNOSIS — I132 Hypertensive heart and chronic kidney disease with heart failure and with stage 5 chronic kidney disease, or end stage renal disease: Secondary | ICD-10-CM | POA: Insufficient documentation

## 2018-07-27 DIAGNOSIS — N186 End stage renal disease: Secondary | ICD-10-CM | POA: Insufficient documentation

## 2018-07-27 DIAGNOSIS — E860 Dehydration: Secondary | ICD-10-CM | POA: Insufficient documentation

## 2018-07-27 DIAGNOSIS — E119 Type 2 diabetes mellitus without complications: Secondary | ICD-10-CM | POA: Insufficient documentation

## 2018-07-27 DIAGNOSIS — Z794 Long term (current) use of insulin: Secondary | ICD-10-CM | POA: Diagnosis not present

## 2018-07-27 DIAGNOSIS — I10 Essential (primary) hypertension: Secondary | ICD-10-CM | POA: Diagnosis not present

## 2018-07-27 DIAGNOSIS — Z79899 Other long term (current) drug therapy: Secondary | ICD-10-CM | POA: Insufficient documentation

## 2018-07-27 DIAGNOSIS — Z7982 Long term (current) use of aspirin: Secondary | ICD-10-CM | POA: Diagnosis not present

## 2018-07-27 LAB — BASIC METABOLIC PANEL
Anion gap: 18 — ABNORMAL HIGH (ref 5–15)
BUN: 33 mg/dL — AB (ref 6–20)
CO2: 18 mmol/L — ABNORMAL LOW (ref 22–32)
CREATININE: 5.88 mg/dL — AB (ref 0.61–1.24)
Calcium: 9.8 mg/dL (ref 8.9–10.3)
Chloride: 97 mmol/L — ABNORMAL LOW (ref 98–111)
GFR calc Af Amer: 11 mL/min — ABNORMAL LOW (ref >60)
GFR calc non Af Amer: 10 mL/min — ABNORMAL LOW (ref >60)
Glucose, Bld: 138 mg/dL — ABNORMAL HIGH (ref 70–99)
POTASSIUM: 5.2 mmol/L — AB (ref 3.5–5.1)
SODIUM: 133 mmol/L — AB (ref 135–145)

## 2018-07-27 LAB — MAGNESIUM: Magnesium: 2.1 mg/dL (ref 1.7–2.4)

## 2018-07-27 LAB — CBC
HCT: 44.1 % (ref 39.0–52.0)
Hemoglobin: 14.1 g/dL (ref 13.0–17.0)
MCH: 27.5 pg (ref 26.0–34.0)
MCHC: 32 g/dL (ref 30.0–36.0)
MCV: 86.1 fL (ref 80.0–100.0)
Platelets: 240 10*3/uL (ref 150–400)
RBC: 5.12 MIL/uL (ref 4.22–5.81)
RDW: 14.8 % (ref 11.5–15.5)
WBC: 13.9 10*3/uL — AB (ref 4.0–10.5)
nRBC: 0 % (ref 0.0–0.2)

## 2018-07-27 LAB — POCT INR: INR: 1.9 — AB (ref 2.0–3.0)

## 2018-07-27 LAB — I-STAT TROPONIN, ED: TROPONIN I, POC: 0.04 ng/mL (ref 0.00–0.08)

## 2018-07-27 LAB — PROTIME-INR
INR: 1.91
Prothrombin Time: 21.7 seconds — ABNORMAL HIGH (ref 11.4–15.2)

## 2018-07-27 MED ORDER — PROMETHAZINE HCL 25 MG RE SUPP
25.0000 mg | Freq: Four times a day (QID) | RECTAL | Status: DC | PRN
  Filled 2018-07-27: qty 1

## 2018-07-27 MED ORDER — LORAZEPAM 2 MG/ML IJ SOLN
1.0000 mg | Freq: Once | INTRAMUSCULAR | Status: AC
  Administered 2018-07-27: 1 mg via INTRAVENOUS
  Filled 2018-07-27: qty 1

## 2018-07-27 MED ORDER — WARFARIN SODIUM 7.5 MG PO TABS
7.5000 mg | ORAL_TABLET | Freq: Once | ORAL | Status: DC
  Filled 2018-07-27: qty 1

## 2018-07-27 MED ORDER — PROMETHAZINE HCL 25 MG/ML IJ SOLN
12.5000 mg | Freq: Once | INTRAMUSCULAR | Status: AC
  Administered 2018-07-27: 12.5 mg via INTRAVENOUS
  Filled 2018-07-27: qty 1

## 2018-07-27 MED ORDER — METOCLOPRAMIDE HCL 5 MG/5ML PO SOLN
10.0000 mg | Freq: Three times a day (TID) | ORAL | Status: DC
  Filled 2018-07-27 (×4): qty 10

## 2018-07-27 MED ORDER — WARFARIN - PHARMACIST DOSING INPATIENT: Freq: Every day | Status: DC

## 2018-07-27 MED ORDER — SODIUM CHLORIDE 0.9 % IV BOLUS
250.0000 mL | Freq: Once | INTRAVENOUS | Status: AC
  Administered 2018-07-27: 250 mL via INTRAVENOUS

## 2018-07-27 MED ORDER — ISOSORB DINITRATE-HYDRALAZINE 20-37.5 MG PO TABS
1.0000 | ORAL_TABLET | Freq: Three times a day (TID) | ORAL | Status: DC
  Filled 2018-07-27: qty 1

## 2018-07-27 NOTE — ED Provider Notes (Signed)
[facility] EMERGENCY DEPARTMENT Provider Note   CSN: [id number] Arrival date & time: 07/27/18  1022     History   Chief Complaint Chief Complaint  Patient presents with  . Chest Pain  . Nausea  . Emesis  . LVAD    HPI [name] is a 52 y.o. male.  Pt presents to the ED today with cp, n/v.  The pt has a hx of ischemic CM requiring a LVAD in October of 2018.  He had post-op renal failure and has been on dialysis since then.  He is waiting for a heart transplant.  The pt was at dialysis when he developed n/v and cp.  The pt was given 250 cc NS and 4 mg zofran by EMS.  The pt said he's still nauseous.  LVAD team called to ED.     Past Medical History:  Diagnosis Date  . Angina   . ASCVD (arteriosclerotic cardiovascular disease)    , Anterior infarction 2005, LAD diagonal bifurcation intervention 03/2004  . Automatic implantable cardiac defibrillator -St. Jude's       . Benign neoplasm of colon   . CHF (congestive heart failure) ([place])   . Chronic systolic heart failure ([place])   . Coronary artery disease     Widely patent previously placed stents in the left anterior   . Crohn's disease ([place])   . 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 Dialysis patient ([place])   . Dyspnea   . Gastroparesis   . GERD (gastroesophageal reflux disease)   . High cholesterol   . Hyperlipidemia   . Hypersomnolent    Previous diagnosis of narcolepsy  . Hypertension, essential   . Ischemic cardiomyopathy    Ejection fraction 15-20% catheterization 2010  . Type II or unspecified type diabetes mellitus without mention of complication, not stated as uncontrolled   . Unspecified gastritis and gastroduodenitis without mention of hemorrhage     Patient Active Problem List   Diagnosis Date Noted  . Nausea & vomiting 06/11/2018  . Heart failure ([place]) 05/23/2018  . Hypertensive urgency 03/25/2018  . Benign essential HTN   . Crohn's disease with  complication ([place])   . Diabetes mellitus type 2 in nonobese (HCC)   . Anemia of chronic disease   . Sinus tachycardia   . Leukocytosis   . Left ventricular assist device (LVAD) complication 64/68/0321  . Left ventricular assist device (LVAD) complication, initial encounter 02/28/2018  . Factor V Leiden carrier ([place]) 01/10/2018  . History of creation of ostomy ([place]) 01/10/2018  . Hypercoagulable state ([place]) 01/10/2018  . ESRD (end stage renal disease) ([place]) 12/11/2017  . Left ventricular assist device present ([place]) 12/05/2017  . ESRD (end stage renal disease) on dialysis ([place]) 10/29/2017  . Nausea with vomiting 09/12/2017  . Intractable nausea and vomiting 07/31/2017  . 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) ([place]) 07/10/2017  . LVAD (left ventricular assist device) present ([place])   . Palliative care by specialist   . Chronic systolic CHF (congestive heart failure) ([place]) 06/07/2017  . ACP (advance care planning)   . Encounter for central line placement   . Diarrhea   . Palliative care encounter   . Nausea   . CHF exacerbation ([place]) 04/05/2017  . Diabetic keto-acidosis ([place]) 04/05/2017  . Type 2 diabetes mellitus with complication, with long-term current use of insulin ([place]) 03/08/2017  . ESRD on dialysis ([place]) 01/17/2017  . AKI (acute kidney injury) ([place])   .  Non-ST elevation (NSTEMI) myocardial infarction ([place])   . CHF (congestive heart failure) ([place]) 09/02/2016  . Elevated serum creatinine   . Encounter for therapeutic drug monitoring 08/03/2016  . Chest pain 04/26/2012  . SVT (supraventricular tachycardia) ([place]) 04/26/2012  . Gastroparesis 11/18/2011  . Nausea and vomiting in adult 11/17/2011  . Coronary artery disease   . Ischemic cardiomyopathy   . Essential hypertension   . Automatic implantable cardioverter-defibrillator in situ   . Deep venous thrombosis ([place])   . Hypersomnolent   . POLYP, COLON 09/25/2008  . Type 2 diabetes mellitus ([place]) 09/25/2008  .  Dyslipidemia, goal LDL below 70 09/25/2008  . GASTRITIS 09/25/2008  . DIVERTICULOSIS, COLON 09/25/2008  . RECTAL MASS 09/25/2008  . History of Crohn's disease 09/05/2006    Past Surgical History:  Procedure Laterality Date  . A/V SHUNTOGRAM N/A 05/24/2018   Procedure: A/V SHUNTOGRAM;  Surgeon: Marty Heck, MD;  Location: [facility] CV [facility];  Service: Cardiovascular;  Laterality: N/A;  . APPENDECTOMY    . AV FISTULA PLACEMENT Right 06/26/2017   Procedure: ARTERIOVENOUS (AV) FISTULA CREATION VERSUS GRAFT RIGHT  ARM;  Surgeon: Conrad [place], MD;  Location: [facility];  Service: Vascular;  Laterality: Right;  . AV FISTULA PLACEMENT Right 10/31/2017   Procedure: INSERTION OF ARTERIOVENOUS (AV) ARTEGRAFT,  RIGHT UPPER ARM;  Surgeon: Conrad Hooppole, MD;  Location: [facility];  Service: Vascular;  Laterality: Right;  . AV FISTULA PLACEMENT Left 05/29/2018   Procedure: ARTERIOVENOUS (AV) FISTULA CREATION  LEFT UPPER EXTREMITY;  Surgeon: Waynetta Sandy, MD;  Location: [facility];  Service: Vascular;  Laterality: Left;  . CARDIAC DEFIBRILLATOR PLACEMENT  2010   St. Jude ICD  . CENTRAL LINE INSERTION Right 05/24/2018   Procedure: CENTRAL LINE INSERTION;  Surgeon: Marty Heck, MD;  Location: [facility];  Service: Cardiovascular;  Laterality: Right;  . COLECTOMY  ~ 2003   "for Crohn's" ascending colon.    . CORONARY STENT INTERVENTION N/A 11/25/2016   Procedure: Coronary Stent Intervention;  Surgeon: Leonie Man, MD;  Location: [facility];  Service: Cardiovascular;  Laterality: N/A;  . EP IMPLANTABLE DEVICE N/A 09/14/2016   Procedure: ICD Generator Changeout;  Surgeon: Deboraha Sprang, MD;  Location: [facility];  Service: Cardiovascular;  Laterality: N/A;  . ESOPHAGOGASTRODUODENOSCOPY N/A 07/12/2017   Procedure: ESOPHAGOGASTRODUODENOSCOPY (EGD);  Surgeon: Jerene Bears, MD;  [facility];  Service: Gastroenterology;  Laterality: N/A;  VAD pt so VAD team will  need to accompany pt.   [name] FETAL SURGERY FOR CONGENITAL HERNIA     ????? pt has no knowledge of this..    . INGUINAL HERNIA REPAIR    . INSERTION OF DIALYSIS CATHETER Right 06/26/2017   Procedure: INSERTION OF TUNNELED  DIALYSIS CATHETER RIGHT INTERNAL JUGULAR;  Surgeon: Conrad Wabash, MD;  Location: [facility];  Service: Vascular;  Laterality: Right;  . INSERTION OF IMPLANTABLE LEFT VENTRICULAR ASSIST DEVICE N/A 06/12/2017   Procedure: INSERTION OF IMPLANTABLE LEFT VENTRICULAR ASSIST [place] 3;  Surgeon: Ivin Poot, MD;  Location: [facility];  Service: Open Heart Surgery;  Laterality: N/A;  HM3 LVAD  CIRC ARREST  NITRIC OXIDE  . INSERTION OF IMPLANTABLE LEFT VENTRICULAR ASSIST DEVICE N/A 02/28/2018   Procedure: REDO INSERTION OF IMPLANTABLE LEFT VENTRICULAR ASSIST DEVICE - HEARTMATE 3;  Surgeon: Ivin Poot, MD;  Location: [facility];  Service: Open Heart Surgery;  Laterality: N/A;  . IR REMOVAL TUN CV CATH W/O  [facility]  02/26/2018  . IR THORACENTESIS ASP PLEURAL SPACE W/IMG GUIDE  06/28/2017  . LIGATION OF ARTERIOVENOUS  FISTULA Right 10/31/2017   Procedure: LIGATION OF BRACHIO-BASILIC VEIN TRANSPOSITION RIGHT ARM;  Surgeon: Conrad Ridgeland, MD;  Location: [facility];  Service: Vascular;  Laterality: Right;  . MULTIPLE EXTRACTIONS WITH ALVEOLOPLASTY N/A 06/08/2017   Procedure: MULTIPLE EXTRACTION WITH ALVEOLOPLASTY AND PRE PROSTHETIC SURGERY AS NEEDED;  Surgeon: Lenn Cal, DDS;  Location: [facility];  Service: Oral Surgery;  Laterality: N/A;  . RIGHT HEART CATH N/A 06/07/2017   Procedure: RIGHT HEART CATH;  Surgeon: Larey Dresser, MD;  Location: [facility];  Service: Cardiovascular;  Laterality: N/A;  . RIGHT HEART CATH N/A 02/08/2018   Procedure: RIGHT HEART CATH;  Surgeon: Larey Dresser, MD;  Location: [facility];  Service: Cardiovascular;  Laterality: N/A;  . RIGHT/LEFT HEART CATH AND CORONARY ANGIOGRAPHY N/A 11/23/2016   Procedure: Right/Left Heart Cath and Coronary  Angiography;  Surgeon: Larey Dresser, MD;  Location: [facility] LAB;  Service: Cardiovascular;  Laterality: N/A;  . TEE WITHOUT CARDIOVERSION N/A 06/12/2017   Procedure: TRANSESOPHAGEAL ECHOCARDIOGRAM (TEE);  Surgeon: Prescott Gum, Collier Salina, MD;  Location: [facility];  Service: Open Heart Surgery;  Laterality: N/A;  . TEE WITHOUT CARDIOVERSION N/A 02/28/2018   Procedure: TRANSESOPHAGEAL ECHOCARDIOGRAM (TEE);  Surgeon: Prescott Gum, Collier Salina, MD;  Location: [facility];  Service: Open Heart Surgery;  Laterality: N/A;  . TEMPORARY DIALYSIS CATHETER Left 05/24/2018   Procedure: TEMPORARY DIALYSIS CATHETER;  Surgeon: Marty Heck, MD;  Location: [facility] CV LAB;  Service: Cardiovascular;  Laterality: Left;  . THROMBECTOMY AND REVISION OF ARTERIOVENTOUS (AV) GORETEX  GRAFT Right 12/12/2017   Procedure: THROMBECTOMY ARTERIOVENTOUS (AV) GORETEX  GRAFT RIGHT UPPER ARM;  Surgeon: Conrad Merrimack, MD;  Location: [facility];  Service: Vascular;  Laterality: Right;        Home Medications    Prior to Admission medications   Medication Sig Start Date End Date Taking? Authorizing Provider  warfarin (COUMADIN) 5 MG tablet Take 5 mg (1 tablet) daily except 7.5 mg (1 and 1/2 tablets) on Monday   Yes [provider]  aspirin EC 81 MG tablet Take 1 tablet (81 mg total) by mouth daily. 07/26/17   Larey Dresser, MD  atorvastatin (LIPITOR) 40 MG tablet Take 1 tablet (40 mg total) by mouth daily at 6 PM. 01/25/18   [name], MD  calcitRIOL (ROCALTROL) 0.5 MCG capsule Take 1 capsule (0.5 mcg total) by mouth every Monday, Wednesday, and Friday. 06/06/18   Georgiana Shore, NP  citalopram (CELEXA) 20 MG tablet Take 1 tablet (20 mg total) by mouth daily. 03/19/18   Georgiana Shore, NP  dronabinol (MARINOL) 2.5 MG capsule Take 1 capsule (2.5 mg total) by mouth 2 (two) times daily before lunch and supper. 03/27/18   Larey Dresser, MD  enoxaparin (LOVENOX) 80 MG/0.8ML injection Inject 0.75 mLs (75 mg total) into  the skin every 12 (twelve) hours. 07/06/18   Larey Dresser, MD  erythromycin (E-MYCIN) 250 MG tablet Take 1 tablet (250 mg total) by mouth 3 (three) times daily. 03/25/18   Isaiah Serge, NP  insulin aspart (NOVOLOG) 100 UNIT/ML injection Inject 5 Units into the skin 3 (three) times daily with meals. 01/22/18   Georgiana Shore, NP  insulin glargine (LANTUS) 100 UNIT/ML injection Inject 0.1 mLs (10 Units total) into the skin daily. 05/09/18   Lovenia Kim, MD  isosorbide-hydrALAZINE (BIDIL) 20-37.5 MG tablet Take 1.5 tablets by mouth every 8 (eight) hours. Take 1.5 tablets every 8 hours on non dialysis days. Patient taking differently: Take 1.5 tablets by mouth See admin instructions. Take 1.5 tablets every 8 hours on non dialysis days. 03/19/18   Georgiana Shore, NP  LORazepam (ATIVAN) 0.5 MG tablet Take 1 tablet (0.5 mg total) by mouth every 12 (twelve) hours as needed (nausea). 03/27/18   Larey Dresser, MD  metoCLOPramide (REGLAN) 10 MG tablet Take 1 tablet (10 mg total) by mouth 3 (three) times daily. 01/22/18   Georgiana Shore, NP  multivitamin (RENA-VIT) TABS tablet Take 1 tablet by mouth daily.    [provider]  ondansetron (ZOFRAN ODT) 4 MG disintegrating tablet Take 1 tablet (4 mg total) by mouth every 6 (six) hours as needed for nausea or vomiting. 01/10/17   Esterwood, Amy S, PA-C  pantoprazole (PROTONIX) 40 MG tablet Take 40 mg by mouth 2 (two) times daily.     [provider]  promethazine (PHENERGAN) 12.5 MG tablet Take 1 tablet (12.5 mg total) by mouth every 6 (six) hours as needed for nausea or vomiting. 07/06/18   Larey Dresser, MD    Family History Family History  Problem Relation Age of Onset  . Colon polyps Mother   . Diabetes Mother   . Heart attack Father   . Diabetes Father   . Stroke Paternal Grandfather   . Colitis Sister   . Heart disease Brother   . Colitis Sister   . Colon cancer Neg Hx     Social History Social History   Tobacco Use  .  Smoking status: Never Smoker  . Smokeless tobacco: Never Used  Substance Use Topics  . Alcohol use: No  . Drug use: No     Allergies   Metformin and related   Review of Systems Review of Systems  Cardiovascular: Positive for chest pain.  Gastrointestinal: Positive for nausea and vomiting.  All other systems reviewed and are negative.    Physical Exam Updated Vital Signs Ht 5' 7"  (1.702 m)   Wt (S) 80 kg Comment: 78kg at dialysis this am   BMI 27.62 kg/m   Physical Exam  Constitutional: He is oriented to person, place, and time. He appears well-developed. He appears ill.  HENT:  Head: Normocephalic and atraumatic.  Eyes: Pupils are equal, round, and reactive to light. EOM are normal.  Neck: Normal range of motion. Neck supple.  Cardiovascular:  LVAD hum  Pulmonary/Chest: Effort normal and breath sounds normal.  Abdominal: Soft. Bowel sounds are normal.  Musculoskeletal: Normal range of motion.       Right lower leg: Normal.       Left lower leg: Normal.  Neurological: He is alert and oriented to person, place, and time.  Skin: Skin is warm. Capillary refill takes less than 2 seconds.  Psychiatric: He has a normal mood and affect. His behavior is normal.  Nursing note and vitals reviewed.    ED Treatments / Results  Labs (all labs ordered are listed, but only abnormal results are displayed) Labs Reviewed  BASIC METABOLIC PANEL - Abnormal; Notable for the following components:      Result Value   Sodium 133 (*)    Potassium 5.2 (*)    Chloride 97 (*)    CO2 18 (*)    Glucose, Bld 138 (*)    BUN 33 (*)    Creatinine, Ser 5.88 (*)  GFR calc non Af Amer 10 (*)    GFR calc Af Amer 11 (*)    Anion gap 18 (*)    All other components within normal limits  CBC - Abnormal; Notable for the following components:   WBC 13.9 (*)    All other components within normal limits  PROTIME-INR - Abnormal; Notable for the following components:   Prothrombin Time 21.7 (*)     All other components within normal limits  MAGNESIUM  I-STAT TROPONIN, ED    EKG None  Radiology Dg Chest Port 1 View  Result Date: 07/27/2018 CLINICAL DATA:  Pt having SOB and CP today - hx of LVAD type system, defibrillator placed, CHF, htn, diabetes, GERD, DVT, on dialysis EXAM: PORTABLE CHEST - 1 VIEW COMPARISON:  06/11/2018 FINDINGS: Previous median sternotomy. Stable left IJ tunneled hemodialysis catheter, left subclavian AICD, and LVAD. Lungs are clear. Stable mild cardiomegaly.  Coronary stents noted. No effusion. Visualized bones unremarkable. IMPRESSION: Stable cardiomegaly and postop changes.  No acute findings. Electronically Signed   By: [name] M.D.   On: 07/27/2018 11:06    Procedures Procedures (including critical care time)  Medications Ordered in ED Medications  promethazine (PHENERGAN) suppository 25 mg (has no administration in time range)  sodium chloride 0.9 % bolus 250 mL (250 mLs Intravenous New Bag/Given 07/27/18 1456)  isosorbide-hydrALAZINE (BIDIL) 20-37.5 MG per tablet 1 tablet (has no administration in time range)  metoCLOPramide (REGLAN) 5 MG/5ML solution 10 mg (has no administration in time range)  warfarin (COUMADIN) tablet 7.5 mg (has no administration in time range)  Warfarin - Pharmacist Dosing Inpatient (has no administration in time range)  promethazine (PHENERGAN) injection 12.5 mg (12.5 mg Intravenous Given 07/27/18 1118)  LORazepam (ATIVAN) injection 1 mg (1 mg Intravenous Given 07/27/18 1122)     Initial Impression / Assessment and Plan / ED Course  I have reviewed the triage vital signs and the nursing notes.  Pertinent labs & imaging results that were available during my care of the patient were reviewed by me and considered in my medical decision making (see chart for details).    LVAD team saw pt in ED.  Pt given IVFs, phenergan, and ativan.  He is feeling much better.  He is able to tolerate po fluids.  He is instructed to f/u  with LVAD team.  Return if worse.  Final Clinical Impressions(s) / ED Diagnoses   Final diagnoses:  Dehydration  Non-intractable vomiting with nausea, unspecified vomiting type  LVAD (left ventricular assist device) present ([place])  ESRD on hemodialysis [facility])    ED Discharge Orders    None       Isla Pence, MD 07/27/18 1519

## 2018-07-27 NOTE — Telephone Encounter (Signed)
Spoke with [name] regarding Coumadin dosing. Patient to take 84m tonight, then resume current regimen [facility]armD. Patient verbalized understanding.   AEmerson MonteRN VAuroraCoordinator  Office: [phone] 24/7 Pager: [phone]

## 2018-07-27 NOTE — Progress Notes (Signed)
   Feeling better after IVF. Tolerating a diet and liquids.    Ok to go home. He has follow up.   Carling Liberman  NP-C  2:58 PM

## 2018-07-27 NOTE — Progress Notes (Signed)
LVAD Coordinator ED Encounter  [name] a 52 y.o. male that presented to [facility] ER today due to N/V and chest pain that started during his dialysis treatment. He  has a past medical history of Angina, ASCVD (arteriosclerotic cardiovascular disease), Automatic implantable cardiac defibrillator -St. Jude's, Benign neoplasm of colon, CHF (congestive heart failure) ([place]), Chronic systolic heart failure ([place]), Coronary artery disease, Crohn's disease ([place]), Deep venous thrombosis ([place]), Dialysis patient ([place]), Dyspnea, Gastroparesis, GERD (gastroesophageal reflux disease), High cholesterol, Hyperlipidemia, Hypersomnolent, Hypertension, essential, Ischemic cardiomyopathy, Type II or unspecified type diabetes mellitus without mention of complication, not stated as uncontrolled, and Unspecified gastritis and gastroduodenitis without mention of hemorrhage.[name]   LVAD is a HM3 and was implanted on 03/01/18 by [employer].    Vital signs: HR: 89 Doppler MAP:  Automated BP: 102/82(90) O2 Sat: 100%  LVAD interrogation reveals:  Speed: 5500 Flow: 3.2 Power:  4.5 PI: 9.0  Alarms: NONE Events: 20-30 daily  Drive Line: CDI    Updated VAD Providers Dr. Aundra Dubin and Amy Clegg about the above. No LVAD issues and pump is functioning as expected. Able to independently manage LVAD equipment. No LVAD needs at this time.    [name], RN VAD Coordinator   Office: [phone] 24/7 Emergency VAD Pager: [phone]

## 2018-07-27 NOTE — Consult Note (Addendum)
Advanced Heart Failure Team Consult Note   Primary Physician: Lovenia Kim, MD PCP-Cardiologist:  No primary care provider on file.  Reason for Consultation: Hearrt Failure   HPI:    [name] is seen today for evaluation of LVAD at the request of Dr Gilford Raid.   [name] is a 52 y.o. with history of CAD s/p anterior MI in 2010 and NSTEMI in 3/18 with DES to pRCA and mLAD, diabetic, gastroparesis, Chrons disease, DMII and ischemic cardiomyopathy, 06/2017 s/p Heartmate 3 LVAD and ESRD. He has LUE AVR 05/2018 .    Earlier today he was at dialysis and developed chest pain and nausea. . Dry weight was lowered from 80 >78 kg. He had  600 cc removed. As he was completing dialysis he developed chest pain. He was given 250 cc NS by EMS and presented to the ED. He says his appetite has been poor due to ongoing nausea.   In the ED he is receiving IV fluids and has been given ativan and phenrgan. Feeling a little better with IV fluids. Denies chest pain. Denies BRBPR.   Ramp Echo   03/2018   Pump Flow 4.1, speed 5500 RPM, pulse index 3.5, power 4.4  LV cannula appears well positioned.  VAD interrogation PI 9  Speed 5500 PP 4.6  PF 3.2  20-30 PI events.   Review of Systems: [y] = yes, [ ]  = no   General: Weight gain [ ] ; Weight loss [ ] ; Anorexia [ ] ; Fatigue [Y ]; Fever [ ] ; Chills [ ] ; Weakness [Y] Cardiac: Chest pain/pressure [Y ]; Resting SOB [ ] ; Exertional SOB [ ] ; Orthopnea [ ] ; Pedal Edema [ ] ; Palpitations [ ] ; Syncope [ ] ; Presyncope [ ] ; Paroxysmal nocturnal dyspnea[ ]   Pulmonary: Cough [ ] ; Wheezing[ ] ; Hemoptysis[ ] ; Sputum [ ] ; Snoring [ ]   GI: Vomiting[Y ]; Dysphagia[ ] ; Melena[ ] ; Hematochezia [ ] ; Heartburn[ ] ; Abdominal pain [ ] ; Constipation [ ] ; Diarrhea [ ] ; BRBPR [ ]   GU: Hematuria[ ] ; Dysuria [ ] ; Nocturia[ ]   Vascular: Pain in legs with walking [ ] ; Pain in feet with lying flat [ ] ; Non-healing sores [ ] ; Stroke [ ] ; TIA [ ] ; Slurred speech [ ] ;  Neuro:  Headaches[ ] ; Vertigo[ ] ; Seizures[ ] ; Paresthesias[ ] ;Blurred vision [ ] ; Diplopia [ ] ; Vision changes [ ]   Ortho/Skin: Arthritis [ ] ; Joint pain [ ] Y; Muscle pain [ ] ; Joint swelling [ ] ; Back Pain [ ] ; Rash [ ]   Psych: Depression[ ] ; Anxiety[ ]   Heme: Bleeding problems [ ] ; Clotting disorders [ ] ; Anemia [ ]   Endocrine: Diabetes [ Y]; Thyroid dysfunction[ ]   Home Medications Prior to Admission medications   Medication Sig Start Date End Date Taking? Authorizing Provider  aspirin EC 81 MG tablet Take 1 tablet (81 mg total) by mouth daily. 07/26/17   Larey Dresser, MD  atorvastatin (LIPITOR) 40 MG tablet Take 1 tablet (40 mg total) by mouth daily at 6 PM. 01/25/18   [name], MD  calcitRIOL (ROCALTROL) 0.5 MCG capsule Take 1 capsule (0.5 mcg total) by mouth every Monday, Wednesday, and Friday. 06/06/18   Georgiana Shore, NP  citalopram (CELEXA) 20 MG tablet Take 1 tablet (20 mg total) by mouth daily. 03/19/18   Georgiana Shore, NP  dronabinol (MARINOL) 2.5 MG capsule Take 1 capsule (2.5 mg total) by mouth 2 (two) times daily before lunch and supper. 03/27/18   Larey Dresser, MD  enoxaparin (LOVENOX) 80 MG/0.8ML injection Inject 0.75 mLs (75 mg total) into the skin every 12 (twelve) hours. 07/06/18   Larey Dresser, MD  erythromycin (E-MYCIN) 250 MG tablet Take 1 tablet (250 mg total) by mouth 3 (three) times daily. 03/25/18   Isaiah Serge, NP  insulin aspart (NOVOLOG) 100 UNIT/ML injection Inject 5 Units into the skin 3 (three) times daily with meals. 01/22/18   Georgiana Shore, NP  insulin glargine (LANTUS) 100 UNIT/ML injection Inject 0.1 mLs (10 Units total) into the skin daily. 05/09/18   Lovenia Kim, MD  isosorbide-hydrALAZINE (BIDIL) 20-37.5 MG tablet Take 1.5 tablets by mouth every 8 (eight) hours. Take 1.5 tablets every 8 hours on non dialysis days. Patient taking differently: Take 1.5 tablets by mouth See admin instructions. Take 1.5 tablets every 8 hours on non  dialysis days. 03/19/18   Georgiana Shore, NP  LORazepam (ATIVAN) 0.5 MG tablet Take 1 tablet (0.5 mg total) by mouth every 12 (twelve) hours as needed (nausea). 03/27/18   Larey Dresser, MD  metoCLOPramide (REGLAN) 10 MG tablet Take 1 tablet (10 mg total) by mouth 3 (three) times daily. 01/22/18   Georgiana Shore, NP  multivitamin (RENA-VIT) TABS tablet Take 1 tablet by mouth daily.    [provider]  ondansetron (ZOFRAN ODT) 4 MG disintegrating tablet Take 1 tablet (4 mg total) by mouth every 6 (six) hours as needed for nausea or vomiting. 01/10/17   Esterwood, Amy S, PA-C  pantoprazole (PROTONIX) 40 MG tablet Take 40 mg by mouth 2 (two) times daily.     [provider]  promethazine (PHENERGAN) 12.5 MG tablet Take 1 tablet (12.5 mg total) by mouth every 6 (six) hours as needed for nausea or vomiting. 07/06/18   Larey Dresser, MD  warfarin (COUMADIN) 5 MG tablet Take 5 mg (1 tablet) daily except 7.5 mg (1 and 1/2 tablets) on Monday    [provider]    Past Medical History: Past Medical History:  Diagnosis Date  . Angina   . ASCVD (arteriosclerotic cardiovascular disease)    , Anterior infarction 2005, LAD diagonal bifurcation intervention 03/2004  . Automatic implantable cardiac defibrillator -St. Jude's       . Benign neoplasm of colon   . CHF (congestive heart failure) ([place])   . Chronic systolic heart failure ([place])   . Coronary artery disease     Widely patent previously placed stents in the left anterior   . Crohn's disease ([place])   . 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 Dialysis patient ([place])   . Dyspnea   . Gastroparesis   . GERD (gastroesophageal reflux disease)   . High cholesterol   . Hyperlipidemia   . Hypersomnolent    Previous diagnosis of narcolepsy  . Hypertension, essential   . Ischemic cardiomyopathy    Ejection fraction 15-20% catheterization 2010  . Type II or unspecified type diabetes mellitus without mention of  complication, not stated as uncontrolled   . Unspecified gastritis and gastroduodenitis without mention of hemorrhage     Past Surgical History: Past Surgical History:  Procedure Laterality Date  . A/V SHUNTOGRAM N/A 05/24/2018   Procedure: A/V SHUNTOGRAM;  Surgeon: Marty Heck, MD;  Location: [facility];  Service: Cardiovascular;  Laterality: N/A;  . APPENDECTOMY    . AV FISTULA PLACEMENT Right 06/26/2017   Procedure: ARTERIOVENOUS (AV) FISTULA CREATION VERSUS GRAFT RIGHT  ARM;  Surgeon: Conrad Rusk,  MD;  Location: [place] [facility];  Service: Vascular;  Laterality: Right;  . AV FISTULA PLACEMENT Right 10/31/2017   Procedure: INSERTION OF ARTERIOVENOUS (AV) ARTEGRAFT,  RIGHT UPPER ARM;  Surgeon: Conrad [place], MD;  Location: [place] [facility];  Service: Vascular;  Laterality: Right;  . AV FISTULA PLACEMENT Left 05/29/2018   Procedure: ARTERIOVENOUS (AV) FISTULA CREATION  LEFT UPPER EXTREMITY;  Surgeon: Waynetta Sandy, MD;  Location: [place] [facility];  Service: Vascular;  Laterality: Left;  . CARDIAC DEFIBRILLATOR PLACEMENT  2010   St. Jude ICD  . CENTRAL LINE INSERTION Right 05/24/2018   Procedure: CENTRAL LINE INSERTION;  Surgeon: Marty Heck, MD;  Location: [facility];  Service: Cardiovascular;  Laterality: Right;  . COLECTOMY  ~ 2003   "for Crohn's" ascending colon.    . CORONARY STENT INTERVENTION N/A 11/25/2016   Procedure: Coronary Stent Intervention;  Surgeon: Leonie Man, MD;  Location: [facility];  Service: Cardiovascular;  Laterality: N/A;  . EP IMPLANTABLE DEVICE N/A 09/14/2016   Procedure: ICD Generator Changeout;  Surgeon: Deboraha Sprang, MD;  Location: [facility];  Service: Cardiovascular;  Laterality: N/A;  . ESOPHAGOGASTRODUODENOSCOPY N/A 07/12/2017   Procedure: ESOPHAGOGASTRODUODENOSCOPY (EGD);  Surgeon: Jerene Bears, MD;  Location: [facility];  Service: Gastroenterology;  Laterality: N/A;  VAD pt so VAD team will need to accompany pt.   [name]  FETAL SURGERY FOR CONGENITAL HERNIA     ????? pt has no knowledge of this..    . INGUINAL HERNIA REPAIR    . INSERTION OF DIALYSIS CATHETER Right 06/26/2017   Procedure: INSERTION OF TUNNELED  DIALYSIS CATHETER RIGHT INTERNAL JUGULAR;  Surgeon: Conrad [place] Crook, MD;  Location: [place];  Service: Vascular;  Laterality: Right;  . INSERTION OF IMPLANTABLE LEFT VENTRICULAR ASSIST DEVICE N/A 06/12/2017   Procedure: INSERTION OF IMPLANTABLE LEFT VENTRICULAR ASSIST [place] 3;  Surgeon: Ivin Poot, MD;  Location: [place];  Service: Open Heart Surgery;  Laterality: N/A;  HM3 LVAD  CIRC ARREST  NITRIC OXIDE  . INSERTION OF IMPLANTABLE LEFT VENTRICULAR ASSIST DEVICE N/A 02/28/2018   Procedure: REDO INSERTION OF IMPLANTABLE LEFT VENTRICULAR ASSIST DEVICE - HEARTMATE 3;  Surgeon: Ivin Poot, MD;  Location: [place];  Service: Open Heart Surgery;  Laterality: N/A;  . IR REMOVAL TUN CV CATH W/O FL  02/26/2018  . IR THORACENTESIS ASP PLEURAL SPACE W/IMG GUIDE  06/28/2017  . LIGATION OF ARTERIOVENOUS  FISTULA Right 10/31/2017   Procedure: LIGATION OF BRACHIO-BASILIC VEIN TRANSPOSITION RIGHT ARM;  Surgeon: Conrad [place], MD;  Location: [place];  Service: Vascular;  Laterality: Right;  . MULTIPLE EXTRACTIONS WITH ALVEOLOPLASTY N/A 06/08/2017   Procedure: MULTIPLE EXTRACTION WITH ALVEOLOPLASTY AND PRE PROSTHETIC SURGERY AS NEEDED;  Surgeon: Lenn Cal, DDS;  Location: [place];  Service: Oral Surgery;  Laterality: N/A;  . RIGHT HEART CATH N/A 06/07/2017   Procedure: RIGHT HEART CATH;  Surgeon: Larey Dresser, MD;  Location: [facility];  Service: Cardiovascular;  Laterality: N/A;  . RIGHT HEART CATH N/A 02/08/2018   Procedure: RIGHT HEART CATH;  Surgeon: Larey Dresser, MD;  Location: [facility];  Service: Cardiovascular;  Laterality: N/A;  . RIGHT/LEFT HEART CATH AND CORONARY ANGIOGRAPHY N/A 11/23/2016   Procedure: Right/Left Heart Cath and Coronary Angiography;  Surgeon: Larey Dresser, MD;  Location: [facility] [facility];  Service: Cardiovascular;  Laterality: N/A;  . TEE WITHOUT CARDIOVERSION N/A 06/12/2017   Procedure: TRANSESOPHAGEAL ECHOCARDIOGRAM (  TEE);  Surgeon: Ivin Poot, MD;  Location: [place];  Service: Open Heart Surgery;  Laterality: N/A;  . TEE WITHOUT CARDIOVERSION N/A 02/28/2018   Procedure: TRANSESOPHAGEAL ECHOCARDIOGRAM (TEE);  Surgeon: Prescott Gum, Collier Salina, MD;  Location: [place];  Service: Open Heart Surgery;  Laterality: N/A;  . TEMPORARY DIALYSIS CATHETER Left 05/24/2018   Procedure: TEMPORARY DIALYSIS CATHETER;  Surgeon: Marty Heck, MD;  Location: [facility];  Service: Cardiovascular;  Laterality: Left;  . THROMBECTOMY AND REVISION OF ARTERIOVENTOUS (AV) GORETEX  GRAFT Right 12/12/2017   Procedure: THROMBECTOMY ARTERIOVENTOUS (AV) GORETEX  GRAFT RIGHT UPPER ARM;  Surgeon: Conrad Sunnyslope, MD;  Location: [facility];  Service: Vascular;  Laterality: Right;    Family History: Family History  Problem Relation Age of Onset  . Colon polyps Mother   . Diabetes Mother   . Heart attack Father   . Diabetes Father   . Stroke Paternal Grandfather   . Colitis Sister   . Heart disease Brother   . Colitis Sister   . Colon cancer Neg Hx     Social History: Social History   Socioeconomic History  . Marital status: Married    Spouse name: Not on file  . Number of children: Not on file  . Years of education: Not on file  . Highest education level: Not on file  Occupational History  . Not on file  Social Needs  . Financial resource strain: Not on file  . Food insecurity:    Worry: Not on file    Inability: Not on file  . Transportation needs:    Medical: Not on file    Non-medical: Not on file  Tobacco Use  . Smoking status: Never Smoker  . Smokeless tobacco: Never Used  Substance and Sexual Activity  . Alcohol use: No  . Drug use: No  . Sexual activity: Never  Lifestyle  . Physical activity:    Days per week: Not on file    Minutes  per session: Not on file  . Stress: Not on file  Relationships  . Social connections:    Talks on phone: Not on file    Gets together: Not on file    Attends religious service: Not on file    Active member of club or organization: Not on file    Attends meetings of clubs or organizations: Not on file    Relationship status: Not on file  Other Topics Concern  . Not on file  Social History Narrative  . Not on file    Allergies:  Allergies  Allergen Reactions  . Metformin And Related Diarrhea    Objective:    Vital Signs:   Weight:  [80 kg] 80 kg (11/22 1112)  MAP 110   Weight change: Filed Weights   07/27/18 1112  Weight: (S) 80 kg    Intake/Output:   Intake/Output Summary (Last 24 hours) at 07/27/2018 1203 Last data filed at 07/27/2018 1024 Gross per 24 hour  Intake 250 ml  Output -  Net 250 ml      Physical Exam    Physical Exam: GENERAL: Appears chronically ill.  HEENT: normal  NECK: Supple, JVP 6-7  .  2+ bilaterally, no bruits.  No lymphadenopathy or thyromegaly appreciated.   CARDIAC:  Mechanical heart sounds with LVAD hum present.  LUNGS:  Clear to auscultation bilaterally.  ABDOMEN:  Soft, round, nontender, positive bowel sounds x4.     LVAD exit site: well-healed and incorporated.  Dressing dry and intact.  No erythema or drainage.  Stabilization device present and accurately applied.  EXTREMITIES:  Warm and dry, no cyanosis, clubbing, rash or edema. RUE AVF   NEUROLOGIC:  Alert and oriented x 4.  Gait steady.  No aphasia.  No dysarthria.  Affect pleasant.      Telemetry   Sinus Rhythm -Sinus Tach 90-100s  EKG   N/A    Labs   Basic Metabolic Panel: No results for input(s): NA, K, CL, CO2, GLUCOSE, BUN, CREATININE, CALCIUM, MG, PHOS in the last 168 hours.  Liver Function Tests: No results for input(s): AST, ALT, ALKPHOS, BILITOT, PROT, ALBUMIN in the last 168 hours. No results for input(s): LIPASE, AMYLASE in the last 168 hours. No  results for input(s): AMMONIA in the last 168 hours.  CBC: Recent Labs  Lab 07/27/18 1119  WBC 13.9*  HGB 14.1  HCT 44.1  MCV 86.1  PLT 240    Cardiac Enzymes: No results for input(s): CKTOTAL, CKMB, CKMBINDEX, TROPONINI in the last 168 hours.  BNP: BNP (last 3 results) Recent Labs    03/02/18 0400 03/08/18 0008 03/15/18 0106  BNP 694.1* 1,576.6* 811.3*    ProBNP (last 3 results) No results for input(s): PROBNP in the last 8760 hours.   CBG: No results for input(s): GLUCAP in the last 168 hours.  Coagulation Studies: No results for input(s): LABPROT, INR in the last 72 hours.   Imaging   Dg Chest Port 1 View  Result Date: 07/27/2018 CLINICAL DATA:  Pt having SOB and CP today - hx of LVAD type system, defibrillator placed, CHF, htn, diabetes, GERD, DVT, on dialysis EXAM: PORTABLE CHEST - 1 VIEW COMPARISON:  06/11/2018 FINDINGS: Previous median sternotomy. Stable left IJ tunneled hemodialysis catheter, left subclavian AICD, and LVAD. Lungs are clear. Stable mild cardiomegaly.  Coronary stents noted. No effusion. Visualized bones unremarkable. IMPRESSION: Stable cardiomegaly and postop changes.  No acute findings. Electronically Signed   By: [name] M.D.   On: 07/27/2018 11:06      Medications:     Current Medications:   Infusions:     Patient Profile   [name] is a 52 y.o. with history of CAD s/p anterior MI in 2010 and NSTEMI in 3/18 with DES to pRCA and mLAD, diabetic, gastroparesis, Chrons disease, DMII and ischemic cardiomyopathy, 06/2017 s/p Heartmate 3 LVAD and ESRD. He has LUE AVR 05/2018 .    Earlier today he was at dialysis and developed chest pain and nausea. .  Assessment/Plan   1. Nausea In the setting of Has known gastroparesis. Continue IV fluids suspect he is dry. Will likely need to address dry weight and move it up.  Chronically on erythromycin + reglan.  2. Chest Pain  Troponin 0.04.  Does not appear to be ACS.  3.  ESRD Completed dialysis today but had chest pain/nausea.  Nephrology aware of admit.  4. Chronic Systolic Heart Failure, HMIII VAD interrogate with VAD coordinator. PI elevated. Appears dry. Giving another  250 cc NS bolus. Maps elevated. Will start bidil 1 tab three times a day.  INR 1.9. Will need coumadin prior to d/c. Discussed with pharmacy.  5. HTN Start back meds. Elevated. Give bidil 1 tab tid.   Hope to send home today once he receives some fluids.    Medication concerns reviewed with patient and pharmacy team. Barriers identified: none.   Length of Stay: 0  Darrick Grinder, NP  07/27/2018, 12:03 PM  Advanced Heart Failure  Team Pager [phone] (M-F; [place])  Please contact [facility] for night-coverage after hours (4p -7a ) and weekends on [email or web address]  Patient seen with NP, agree with the above note.   He is here with a suspected flare of his gastroparesis, typical symptoms.  MAP is high but has not had his BP controlling meds.  INR 1.9, missed 1 dose of warfarin.   He is feeling better with symptomatic treatment so far and gentle IV fluid.  We will treat him aggressively in ER for symptoms, if he is feeling better and can take some po, he can go home from ER.  If not will have to be admitted.   [name] 07/27/2018 1:37 PM

## 2018-07-27 NOTE — ED Notes (Signed)
Ordered diet tray for pt  

## 2018-07-27 NOTE — ED Triage Notes (Addendum)
To ED via [facility] from dialysis center on [address]-- c/o nausea and vomiting 3 hours into dialysis treatment-- with chest pain LVAD pt x > 80yr ESRD x >15yrOn arrival - pt is alert/oreitned x 4 - received NS 250cc bolus per EMS in dialysis catheter- also received zofran 4 mg IV -  Nausea some better per pt. No chest pain at present time

## 2018-07-27 NOTE — Progress Notes (Signed)
ANTICOAGULATION CONSULT NOTE - Initial Consult  Pharmacy Consult for warfarin Indication: LVAD  Allergies  Allergen Reactions  . Metformin And Related Diarrhea    Patient Measurements: Height: 5' 7"  (170.2 cm) Weight: (S) 176 lb 5.9 oz (80 kg)(78kg at dialysis this am ) IBW/kg (Calculated) : 66.1  Vital Signs:    Labs: Recent Labs    07/27/18 1119  HGB 14.1  HCT 44.1  PLT 240  LABPROT 21.7*  INR 1.91  CREATININE 5.88*    Estimated Creatinine Clearance: 14.7 mL/min (A) (by C-G formula based on SCr of 5.88 mg/dL (H)).   Medical History: Past Medical History:  Diagnosis Date  . Angina   . ASCVD (arteriosclerotic cardiovascular disease)    , Anterior infarction 2005, LAD diagonal bifurcation intervention 03/2004  . Automatic implantable cardiac defibrillator -St. Jude's       . Benign neoplasm of colon   . CHF (congestive heart failure) ([place])   . Chronic systolic heart failure ([place])   . Coronary artery disease     Widely patent previously placed stents in the left anterior   . Crohn's disease ([place])   . 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 Dialysis patient ([place])   . Dyspnea   . Gastroparesis   . GERD (gastroesophageal reflux disease)   . High cholesterol   . Hyperlipidemia   . Hypersomnolent    Previous diagnosis of narcolepsy  . Hypertension, essential   . Ischemic cardiomyopathy    Ejection fraction 15-20% catheterization 2010  . Type II or unspecified type diabetes mellitus without mention of complication, not stated as uncontrolled   . Unspecified gastritis and gastroduodenitis without mention of hemorrhage    Assessment: 52 year old male here for n/v and chest pain from severe chronic gastroparesis.   Patient on warfarin for LVAD, INR just below goal at 1.9, will give boosted dose today (7.41m) then back to home dose of warfarin 587mdaily then 7.66m566mn mondays.   Goal of Therapy:  INR 2-3 Monitor platelets by anticoagulation  protocol: Yes   Plan:  Warfarin 7.66mg32mnight if still here then back to 66mg 89mly except 7.66mg o50mondays   Erin HearingD., BCPS Clinical Pharmacist 07/27/2018 1:33 PM

## 2018-07-27 NOTE — ED Notes (Signed)
Patient Alert and oriented to baseline. Stable and ambulatory to baseline. Patient verbalized understanding of the discharge instructions.  Patient belongings were taken by the patient.   

## 2018-07-28 ENCOUNTER — Encounter: Payer: Self-pay | Admitting: Podiatry

## 2018-07-28 NOTE — Progress Notes (Signed)
Subjective: [name] is a 52 y.o. y.o. male referred to our clinic program from [facility], his dialysis center for evaluation.  He was referred for tender, elongated, discolored, thick toenails.  Pain is aggravated when wearing enclosed shoe gear.  He is also on blood thinners Lovenox and warfarin. Past Medical History:  Diagnosis Date  . Angina   . ASCVD (arteriosclerotic cardiovascular disease)    , Anterior infarction 2005, LAD diagonal bifurcation intervention 03/2004  . Automatic implantable cardiac defibrillator -[employer] Jude[employer]       . Benign neoplasm of colon   . CHF (congestive heart failure) ([place])   . Chronic systolic heart failure ([place])   . Coronary artery disease     Widely patent previously placed stents in the left anterior   . Crohn's disease ([place])   . 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 Dialysis patient ([place])   . Dyspnea   . Gastroparesis   . GERD (gastroesophageal reflux disease)   . High cholesterol   . Hyperlipidemia   . Hypersomnolent    Previous diagnosis of narcolepsy  . Hypertension, essential   . Ischemic cardiomyopathy    Ejection fraction 15-20% catheterization 2010  . Type II or unspecified type diabetes mellitus without mention of complication, not stated as uncontrolled   . Unspecified gastritis and gastroduodenitis without mention of hemorrhage    Patient Active Problem List   Diagnosis Date Noted  . Nausea & vomiting 06/11/2018  . Heart failure ([place]) 05/23/2018  . Hypertensive urgency 03/25/2018  . Benign essential HTN   . Crohn's disease with complication ([place])   . Diabetes mellitus type 2 in nonobese (HCC)   . Anemia of chronic disease   . Sinus tachycardia   . Leukocytosis   . Left ventricular assist device (LVAD) complication 41/96/2229  . Left ventricular assist device (LVAD) complication, initial encounter 02/28/2018  . Factor V Leiden carrier ([place]) 01/10/2018  . History of creation of ostomy ([place])  01/10/2018  . Hypercoagulable state ([name]) 01/10/2018  . ESRD (end stage renal disease) ([place]) 12/11/2017  . Left ventricular assist device present ([place]) 12/05/2017  . ESRD (end stage renal disease) on dialysis ([place]) 10/29/2017  . Nausea with vomiting 09/12/2017  . Intractable nausea and vomiting 07/31/2017  . 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) ([place]) 07/10/2017  . LVAD (left ventricular assist device) present ([place])   . Palliative care by specialist   . Chronic systolic CHF (congestive heart failure) ([place]) 06/07/2017  . ACP (advance care planning)   . Encounter for central line placement   . Diarrhea   . Palliative care encounter   . Nausea   . CHF exacerbation ([place]) 04/05/2017  . Diabetic keto-acidosis ([place]) 04/05/2017  . Type 2 diabetes mellitus with complication, with long-term current use of insulin ([place]) 03/08/2017  . ESRD on dialysis ([place]) 01/17/2017  . AKI (acute kidney injury) ([place])   . Non-ST elevation (NSTEMI) myocardial infarction ([place])   . CHF (congestive heart failure) ([place]) 09/02/2016  . Elevated serum creatinine   . Encounter for therapeutic drug monitoring 08/03/2016  . Chest pain 04/26/2012  . SVT (supraventricular tachycardia) ([place]) 04/26/2012  . Gastroparesis 11/18/2011  . Nausea and vomiting in adult 11/17/2011  . Coronary artery disease   . Ischemic cardiomyopathy   . Essential hypertension   . Automatic implantable cardioverter-defibrillator in situ   . Deep venous thrombosis ([place])   . Hypersomnolent   . POLYP, COLON 09/25/2008  . Type  2 diabetes mellitus ([place]) 09/25/2008  . Dyslipidemia, goal LDL below 70 09/25/2008  . GASTRITIS 09/25/2008  . DIVERTICULOSIS, COLON 09/25/2008  . RECTAL MASS 09/25/2008  . History of Crohn's disease 09/05/2006   Past Surgical History:  Procedure Laterality Date  . A/V SHUNTOGRAM N/A 05/24/2018   Procedure: A/V SHUNTOGRAM;  Surgeon: Marty Heck, MD;  Location: [facility];  Service:  Cardiovascular;  Laterality: N/A;  . APPENDECTOMY    . AV FISTULA PLACEMENT Right 06/26/2017   Procedure: ARTERIOVENOUS (AV) FISTULA CREATION VERSUS GRAFT RIGHT  ARM;  Surgeon: Conrad Rock Hall, MD;  Location: [facility];  Service: Vascular;  Laterality: Right;  . AV FISTULA PLACEMENT Right 10/31/2017   Procedure: INSERTION OF ARTERIOVENOUS (AV) ARTEGRAFT,  RIGHT UPPER ARM;  Surgeon: Conrad [place], MD;  Location: [facility];  Service: Vascular;  Laterality: Right;  . AV FISTULA PLACEMENT Left 05/29/2018   Procedure: ARTERIOVENOUS (AV) FISTULA CREATION  LEFT UPPER EXTREMITY;  Surgeon: Waynetta Sandy, MD;  Location: [facility];  Service: Vascular;  Laterality: Left;  . CARDIAC DEFIBRILLATOR PLACEMENT  2010   St. Jude ICD  . CENTRAL LINE INSERTION Right 05/24/2018   Procedure: CENTRAL LINE INSERTION;  Surgeon: Marty Heck, MD;  Location: [facility];  Service: Cardiovascular;  Laterality: Right;  . COLECTOMY  ~ 2003   "for Crohn's" ascending colon.    . CORONARY STENT INTERVENTION N/A 11/25/2016   Procedure: Coronary Stent Intervention;  Surgeon: Leonie Man, MD;  Location: [facility];  Service: Cardiovascular;  Laterality: N/A;  . EP IMPLANTABLE DEVICE N/A 09/14/2016   Procedure: ICD Generator Changeout;  Surgeon: Deboraha Sprang, MD;  Location: [facility];  Service: Cardiovascular;  Laterality: N/A;  . ESOPHAGOGASTRODUODENOSCOPY N/A 07/12/2017   Procedure: ESOPHAGOGASTRODUODENOSCOPY (EGD);  Surgeon: Jerene Bears, MD;  [facility];  Service: Gastroenterology;  Laterality: N/A;  VAD pt so VAD team will need to accompany pt.   [name] FETAL SURGERY FOR CONGENITAL HERNIA     ????? pt has no knowledge of this..    . INGUINAL HERNIA REPAIR    . INSERTION OF DIALYSIS CATHETER Right 06/26/2017   Procedure: INSERTION OF TUNNELED  DIALYSIS CATHETER RIGHT INTERNAL JUGULAR;  Surgeon: Conrad Fox Chase, MD;  Location: [facility];  Service: Vascular;  Laterality: Right;  . INSERTION OF  IMPLANTABLE LEFT VENTRICULAR ASSIST DEVICE N/A 06/12/2017   Procedure: INSERTION OF IMPLANTABLE LEFT VENTRICULAR ASSIST [place] 3;  Surgeon: Ivin Poot, MD;  Location: [facility];  Service: Open Heart Surgery;  Laterality: N/A;  HM3 LVAD  CIRC ARREST  NITRIC OXIDE  . INSERTION OF IMPLANTABLE LEFT VENTRICULAR ASSIST DEVICE N/A 02/28/2018   Procedure: REDO INSERTION OF IMPLANTABLE LEFT VENTRICULAR ASSIST DEVICE - HEARTMATE 3;  Surgeon: Ivin Poot, MD;  Location: [facility];  Service: Open Heart Surgery;  Laterality: N/A;  . IR REMOVAL TUN CV CATH W/O FL  02/26/2018  . IR THORACENTESIS ASP PLEURAL SPACE W/IMG GUIDE  06/28/2017  . LIGATION OF ARTERIOVENOUS  FISTULA Right 10/31/2017   Procedure: LIGATION OF BRACHIO-BASILIC VEIN TRANSPOSITION RIGHT ARM;  Surgeon: Conrad Bruno, MD;  Location: [facility];  Service: Vascular;  Laterality: Right;  . MULTIPLE EXTRACTIONS WITH ALVEOLOPLASTY N/A 06/08/2017   Procedure: MULTIPLE EXTRACTION WITH ALVEOLOPLASTY AND PRE PROSTHETIC SURGERY AS NEEDED;  Surgeon: Lenn Cal, DDS;  Location: [facility];  Service: Oral Surgery;  Laterality: N/A;  . RIGHT HEART CATH N/A 06/07/2017   Procedure:  RIGHT HEART CATH;  Surgeon: Larey Dresser, MD;  Location: [facility];  Service: Cardiovascular;  Laterality: N/A;  . RIGHT HEART CATH N/A 02/08/2018   Procedure: RIGHT HEART CATH;  Surgeon: Larey Dresser, MD;  Location: [facility];  Service: Cardiovascular;  Laterality: N/A;  . RIGHT/LEFT HEART CATH AND CORONARY ANGIOGRAPHY N/A 11/23/2016   Procedure: Right/Left Heart Cath and Coronary Angiography;  Surgeon: Larey Dresser, MD;  Location: [facility];  Service: Cardiovascular;  Laterality: N/A;  . TEE WITHOUT CARDIOVERSION N/A 06/12/2017   Procedure: TRANSESOPHAGEAL ECHOCARDIOGRAM (TEE);  Surgeon: Prescott Gum, Collier Salina, MD;  Location: [facility];  Service: Open Heart Surgery;  Laterality: N/A;  . TEE WITHOUT CARDIOVERSION N/A 02/28/2018   Procedure:  TRANSESOPHAGEAL ECHOCARDIOGRAM (TEE);  Surgeon: Prescott Gum, Collier Salina, MD;  Location: [facility];  Service: Open Heart Surgery;  Laterality: N/A;  . TEMPORARY DIALYSIS CATHETER Left 05/24/2018   Procedure: TEMPORARY DIALYSIS CATHETER;  Surgeon: Marty Heck, MD;  Location: [facility];  Service: Cardiovascular;  Laterality: Left;  . THROMBECTOMY AND REVISION OF ARTERIOVENTOUS (AV) GORETEX  GRAFT Right 12/12/2017   Procedure: THROMBECTOMY ARTERIOVENTOUS (AV) GORETEX  GRAFT RIGHT UPPER ARM;  Surgeon: Conrad [place], MD;  [facility];  Service: Vascular;  Laterality: Right;    Current Outpatient Medications:  .  aspirin EC 81 MG tablet, Take 1 tablet (81 mg total) by mouth daily., Disp: 90 tablet, Rfl: 3 .  atorvastatin (LIPITOR) 40 MG tablet, Take 1 tablet (40 mg total) by mouth daily at 6 PM., Disp: 30 tablet, Rfl: 6 .  calcitRIOL (ROCALTROL) 0.5 MCG capsule, Take 1 capsule (0.5 mcg total) by mouth every Monday, Wednesday, and Friday., Disp: 15 capsule, Rfl: 5 .  citalopram (CELEXA) 20 MG tablet, Take 1 tablet (20 mg total) by mouth daily., Disp: 30 tablet, Rfl: 6 .  dronabinol (MARINOL) 2.5 MG capsule, Take 1 capsule (2.5 mg total) by mouth 2 (two) times daily before lunch and supper., Disp: 60 capsule, Rfl: 5 .  enoxaparin (LOVENOX) 80 MG/0.8ML injection, Inject 0.75 mLs (75 mg total) into the skin every 12 (twelve) hours., Disp: 10 Syringe, Rfl: 1 .  erythromycin (E-MYCIN) 250 MG tablet, Take 1 tablet (250 mg total) by mouth 3 (three) times daily., Disp: 90 tablet, Rfl: 4 .  insulin aspart (NOVOLOG) 100 UNIT/ML injection, Inject 5 Units into the skin 3 (three) times daily with meals., Disp: 1 vial, Rfl: 0 .  insulin glargine (LANTUS) 100 UNIT/ML injection, Inject 0.1 mLs (10 Units total) into the skin daily., Disp: 10 mL, Rfl: 5 .  isosorbide-hydrALAZINE (BIDIL) 20-37.5 MG tablet, Take 1.5 tablets by mouth every 8 (eight) hours. Take 1.5 tablets every 8 hours on non dialysis days. (Patient  taking differently: Take 1.5 tablets by mouth See admin instructions. Take 1.5 tablets every 8 hours on non dialysis days.), Disp: 135 tablet, Rfl: 6 .  LORazepam (ATIVAN) 0.5 MG tablet, Take 1 tablet (0.5 mg total) by mouth every 12 (twelve) hours as needed (nausea)., Disp: 60 tablet, Rfl: 2 .  metoCLOPramide (REGLAN) 10 MG tablet, Take 1 tablet (10 mg total) by mouth 3 (three) times daily., Disp: 90 tablet, Rfl: 6 .  multivitamin (RENA-VIT) TABS tablet, Take 1 tablet by mouth daily., Disp: , Rfl:  .  ondansetron (ZOFRAN ODT) 4 MG disintegrating tablet, Take 1 tablet (4 mg total) by mouth every 6 (six) hours as needed for nausea or vomiting., Disp: 40 tablet, Rfl: 1 .  pantoprazole (PROTONIX) 40 MG tablet, Take 40 mg by mouth 2 (two) times daily. , Disp: , Rfl:  .  promethazine (PHENERGAN) 12.5 MG tablet, Take 1 tablet (12.5 mg total) by mouth every 6 (six) hours as needed for nausea or vomiting., Disp: 30 tablet, Rfl: 0 .  warfarin (COUMADIN) 5 MG tablet, Take 5 mg (1 tablet) daily except 7.5 mg (1 and 1/2 tablets) on Monday, Disp: , Rfl:    Allergies  Allergen Reactions  . Metformin And Related Diarrhea   Social History   Tobacco Use  Smoking Status Never Smoker  Smokeless Tobacco Never Used   Family History  Problem Relation Age of Onset  . Colon polyps Mother   . Diabetes Mother   . Heart attack Father   . Diabetes Father   . Stroke Paternal Grandfather   . Colitis Sister   . Heart disease Brother   . Colitis Sister   . Colon cancer Neg Hx     Objective: Vitals:   07/10/18 1336  BP: (!) 140/105  Pulse: 71   Vascular Examination: Capillary refill time immediate x 10 digits Dorsalis pedis pulses palpable b/l Posterior tibial pulses faintly palpable b/l No digital hair x 10 digits Skin temperature gradient within normal limits bilaterally  Dermatological Examination: Skin slightly atrophic bilaterally  Toenails 1-5 b/l discolored, thick, dystrophic with subungual  debris and pain with palpation to nailbeds due to thickness of nails.  Musculoskeletal: Muscle strength 5/5 to all LE muscle groups  Neurological: Sensation intact with 10 gram monofilament. Vibratory sensation intact.  Assessment: Painful onychomycosis toenails 1-5 b/l in patient on chronic anticoagulants, also end-stage renal disease on hemodialysis  Plan: 1. Discussed diabetic foot care principles and diagnoses.  Literature dispensed on today. 2. Toenails 1-5 b/l were debrided in length and girth without iatrogenic bleeding. 3. Hyperkeratotic lesion debrided utilizing sterile chisel blade. 4. Patient to continue soft, supportive shoe gear 5. Patient to report any pedal injuries to medical professional immediately. 6. Avoid self trimming due to use of blood thinner. 7. Follow up 3 months. Patient/POA to call should there be a concern in the interim.

## 2018-07-29 ENCOUNTER — Emergency Department (HOSPITAL_COMMUNITY)
Admission: EM | Admit: 2018-07-29 | Discharge: 2018-07-29 | Disposition: A | Discharge status: Home / Self Care | Payer: BLUE CROSS/BLUE SHIELD | Attending: Emergency Medicine | Admitting: Emergency Medicine

## 2018-07-29 ENCOUNTER — Encounter (HOSPITAL_COMMUNITY): Payer: Self-pay | Admitting: Emergency Medicine

## 2018-07-29 ENCOUNTER — Other Ambulatory Visit: Payer: Self-pay

## 2018-07-29 ENCOUNTER — Emergency Department (HOSPITAL_COMMUNITY): Payer: BLUE CROSS/BLUE SHIELD

## 2018-07-29 DIAGNOSIS — E861 Hypovolemia: Secondary | ICD-10-CM | POA: Diagnosis not present

## 2018-07-29 DIAGNOSIS — Z7901 Long term (current) use of anticoagulants: Secondary | ICD-10-CM | POA: Insufficient documentation

## 2018-07-29 DIAGNOSIS — R0789 Other chest pain: Secondary | ICD-10-CM | POA: Diagnosis present

## 2018-07-29 DIAGNOSIS — I5022 Chronic systolic (congestive) heart failure: Secondary | ICD-10-CM | POA: Insufficient documentation

## 2018-07-29 DIAGNOSIS — I132 Hypertensive heart and chronic kidney disease with heart failure and with stage 5 chronic kidney disease, or end stage renal disease: Secondary | ICD-10-CM | POA: Insufficient documentation

## 2018-07-29 DIAGNOSIS — Z7982 Long term (current) use of aspirin: Secondary | ICD-10-CM | POA: Insufficient documentation

## 2018-07-29 DIAGNOSIS — E1122 Type 2 diabetes mellitus with diabetic chronic kidney disease: Secondary | ICD-10-CM | POA: Diagnosis not present

## 2018-07-29 DIAGNOSIS — Z992 Dependence on renal dialysis: Secondary | ICD-10-CM | POA: Insufficient documentation

## 2018-07-29 DIAGNOSIS — N186 End stage renal disease: Secondary | ICD-10-CM | POA: Diagnosis not present

## 2018-07-29 DIAGNOSIS — Z95811 Presence of heart assist device: Secondary | ICD-10-CM | POA: Diagnosis not present

## 2018-07-29 DIAGNOSIS — Z79899 Other long term (current) drug therapy: Secondary | ICD-10-CM | POA: Diagnosis not present

## 2018-07-29 DIAGNOSIS — Z86718 Personal history of other venous thrombosis and embolism: Secondary | ICD-10-CM | POA: Insufficient documentation

## 2018-07-29 DIAGNOSIS — Z794 Long term (current) use of insulin: Secondary | ICD-10-CM | POA: Insufficient documentation

## 2018-07-29 LAB — BASIC METABOLIC PANEL
Anion gap: 13 (ref 5–15)
BUN: 31 mg/dL — AB (ref 6–20)
CO2: 22 mmol/L (ref 22–32)
CREATININE: 6.02 mg/dL — AB (ref 0.61–1.24)
Calcium: 8.8 mg/dL — ABNORMAL LOW (ref 8.9–10.3)
Chloride: 97 mmol/L — ABNORMAL LOW (ref 98–111)
GFR calc Af Amer: 11 mL/min — ABNORMAL LOW (ref >60)
GFR, EST NON AFRICAN AMERICAN: 10 mL/min — AB (ref >60)
Glucose, Bld: 134 mg/dL — ABNORMAL HIGH (ref 70–99)
Potassium: 3.1 mmol/L — ABNORMAL LOW (ref 3.5–5.1)
SODIUM: 132 mmol/L — AB (ref 135–145)

## 2018-07-29 LAB — CBC
HCT: 40.3 % (ref 39.0–52.0)
Hemoglobin: 13 g/dL (ref 13.0–17.0)
MCH: 28.1 pg (ref 26.0–34.0)
MCHC: 32.3 g/dL (ref 30.0–36.0)
MCV: 87.2 fL (ref 80.0–100.0)
Platelets: 217 10*3/uL (ref 150–400)
RBC: 4.62 MIL/uL (ref 4.22–5.81)
RDW: 14.9 % (ref 11.5–15.5)
WBC: 9.5 10*3/uL (ref 4.0–10.5)
nRBC: 0 % (ref 0.0–0.2)

## 2018-07-29 LAB — I-STAT TROPONIN, ED: TROPONIN I, POC: 0.03 ng/mL (ref 0.00–0.08)

## 2018-07-29 NOTE — ED Notes (Signed)
Pt refused IV start, requested that we continue to administer fluids through HD cath that was already accessed prior to arrival. Per IV nurse and PCP, it's ok to continue to run fluids that were connected by EMS, PTA.  Pt also provided consent. Will contact IV team at time of deaccess.  Access appears patent at this time, no blood backed up in line. Pt denies needs or concerns

## 2018-07-29 NOTE — ED Notes (Signed)
Waiting for IV team to deaccess port prior to discharge

## 2018-07-29 NOTE — Discharge Instructions (Addendum)
1.  You have been given a liter of IV fluids in the emergency department for rehydration. 2.  Call your LVAD coordinator in the morning to discuss ongoing management of your dialysis and appropriate fluid management. 3.  Return to the emergency department if you develop any chest pain, shortness of breath, feeling like he will pass out or other concerning symptoms.

## 2018-07-29 NOTE — ED Triage Notes (Signed)
PER [facility]- pt picked up from dialysis, unable to complete treatment due to c/o chest pain and being too dry. Sent to ER to receive fluids.

## 2018-07-29 NOTE — ED Notes (Signed)
IV team bedside. 

## 2018-07-29 NOTE — ED Provider Notes (Signed)
[facility] EMERGENCY DEPARTMENT Provider Note   CSN: [id number] Arrival date & time: 07/29/18  1026     History   Chief Complaint Chief Complaint  Patient presents with  . LVAD HYPOVOLEMIA    HPI [name] is a 52 y.o. male.  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  He is being sent from dialysis for complaints of low flow on LVAD and chest pain.  Normal saline fluid was administered starting at dialysis, patient has a bag hanging that has 200 mils out.  LVAD is showing current flows of approximately 3.5.  Patient reports that during dialysis, he intermittently gets chest discomfort.  He denies shortness of breath.  He reports he otherwise feels normal.  He has not been having any problems with chest pain, fever, cough, swelling.  Patient has been previously known to be volume depleted from dialysis.  Reportedly, he is transferred to the emergency department with instructions from cardiology for fluid administration and anticipated discharge.  At this time, no concerns for other complications have been expressed.  Reportedly, patient has been low on his dry weight even going into dialysis.  He denies vomiting, diarrhea or change in appetite. Past Medical History:  Diagnosis Date  . Angina   . ASCVD (arteriosclerotic cardiovascular disease)    , Anterior infarction 2005, LAD diagonal bifurcation intervention 03/2004  . Automatic implantable cardiac defibrillator -[employer]. Jude's       . Benign neoplasm of colon   . CHF (congestive heart failure) ([place])   . Chronic systolic heart failure ([place])   . Coronary artery disease     Widely patent previously placed stents in the left anterior   . Crohn's disease ([place])   . 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 Dialysis patient ([place])   . Dyspnea   . Gastroparesis   . GERD (gastroesophageal reflux disease)   . High cholesterol   . Hyperlipidemia   . Hypersomnolent    Previous diagnosis  of narcolepsy  . Hypertension, essential   . Ischemic cardiomyopathy    Ejection fraction 15-20% catheterization 2010  . Type II or unspecified type diabetes mellitus without mention of complication, not stated as uncontrolled   . Unspecified gastritis and gastroduodenitis without mention of hemorrhage     Patient Active Problem List   Diagnosis Date Noted  . Nausea & vomiting 06/11/2018  . Heart failure ([place]) 05/23/2018  . Hypertensive urgency 03/25/2018  . Benign essential HTN   . Crohn's disease with complication ([place])   . Diabetes mellitus type 2 in nonobese (HCC)   . Anemia of chronic disease   . Sinus tachycardia   . Leukocytosis   . Left ventricular assist device (LVAD) complication 05/28/3006  . Left ventricular assist device (LVAD) complication, initial encounter 02/28/2018  . Factor V Leiden carrier ([place]) 01/10/2018  . History of creation of ostomy ([place]) 01/10/2018  . Hypercoagulable state ([place]) 01/10/2018  . ESRD (end stage renal disease) ([place]) 12/11/2017  . Left ventricular assist device present ([place]) 12/05/2017  . ESRD (end stage renal disease) on dialysis ([place]) 10/29/2017  . Nausea with vomiting 09/12/2017  . Intractable nausea and vomiting 07/31/2017  . 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) ([place]) 07/10/2017  . LVAD (left ventricular assist device) present ([place])   . Palliative care by specialist   . Chronic systolic CHF (congestive heart failure) ([place]) 06/07/2017  . ACP (advance care planning)   . Encounter for central  line placement   . Diarrhea   . Palliative care encounter   . Nausea   . CHF exacerbation ([place]) 04/05/2017  . Diabetic keto-acidosis ([place]) 04/05/2017  . Type 2 diabetes mellitus with complication, with long-term current use of insulin ([place]) 03/08/2017  . ESRD on dialysis ([place]) 01/17/2017  . AKI (acute kidney injury) ([place])   . Non-ST elevation (NSTEMI) myocardial infarction ([place])   . CHF (congestive heart failure) ([place]) 09/02/2016    . Elevated serum creatinine   . Encounter for therapeutic drug monitoring 08/03/2016  . Chest pain 04/26/2012  . SVT (supraventricular tachycardia) ([place]) 04/26/2012  . Gastroparesis 11/18/2011  . Nausea and vomiting in adult 11/17/2011  . Coronary artery disease   . Ischemic cardiomyopathy   . Essential hypertension   . Automatic implantable cardioverter-defibrillator in situ   . Deep venous thrombosis ([place])   . Hypersomnolent   . POLYP, COLON 09/25/2008  . Type 2 diabetes mellitus ([place]) 09/25/2008  . Dyslipidemia, goal LDL below 70 09/25/2008  . GASTRITIS 09/25/2008  . DIVERTICULOSIS, COLON 09/25/2008  . RECTAL MASS 09/25/2008  . History of Crohn's disease 09/05/2006    Past Surgical History:  Procedure Laterality Date  . A/V SHUNTOGRAM N/A 05/24/2018   Procedure: A/V SHUNTOGRAM;  Surgeon: Marty Heck, MD;  Location: [facility];  Service: Cardiovascular;  Laterality: N/A;  . APPENDECTOMY    . AV FISTULA PLACEMENT Right 06/26/2017   Procedure: ARTERIOVENOUS (AV) FISTULA CREATION VERSUS GRAFT RIGHT  ARM;  Surgeon: Conrad Renwick, MD;  Location: [facility];  Service: Vascular;  Laterality: Right;  . AV FISTULA PLACEMENT Right 10/31/2017   Procedure: INSERTION OF ARTERIOVENOUS (AV) ARTEGRAFT,  RIGHT UPPER ARM;  Surgeon: Conrad Avon, MD;  Location: [facility];  Service: Vascular;  Laterality: Right;  . AV FISTULA PLACEMENT Left 05/29/2018   Procedure: ARTERIOVENOUS (AV) FISTULA CREATION  LEFT UPPER EXTREMITY;  Surgeon: Waynetta Sandy, MD;  Location: [facility];  Service: Vascular;  Laterality: Left;  . CARDIAC DEFIBRILLATOR PLACEMENT  2010   St. Jude ICD  . CENTRAL LINE INSERTION Right 05/24/2018   Procedure: CENTRAL LINE INSERTION;  Surgeon: Marty Heck, MD;  Location: [facility];  Service: Cardiovascular;  Laterality: Right;  . COLECTOMY  ~ 2003   "for Crohn's" ascending colon.    . CORONARY STENT INTERVENTION N/A 11/25/2016   Procedure: Coronary Stent  Intervention;  Surgeon: Leonie Man, MD;  Location: [facility];  Service: Cardiovascular;  Laterality: N/A;  . EP IMPLANTABLE DEVICE N/A 09/14/2016   Procedure: ICD Generator Changeout;  Surgeon: Deboraha Sprang, MD;  Location: [facility];  Service: Cardiovascular;  Laterality: N/A;  . ESOPHAGOGASTRODUODENOSCOPY N/A 07/12/2017   Procedure: ESOPHAGOGASTRODUODENOSCOPY (EGD);  Surgeon: Jerene Bears, MD;  Location: [facility];  Service: Gastroenterology;  Laterality: N/A;  VAD pt so VAD team will need to accompany pt.   [name] FETAL SURGERY FOR CONGENITAL HERNIA     ????? pt has no knowledge of this..    . INGUINAL HERNIA REPAIR    . INSERTION OF DIALYSIS CATHETER Right 06/26/2017   Procedure: INSERTION OF TUNNELED  DIALYSIS CATHETER RIGHT INTERNAL JUGULAR;  Surgeon: Conrad Leland, MD;  Location: [facility];  Service: Vascular;  Laterality: Right;  . INSERTION OF IMPLANTABLE LEFT VENTRICULAR ASSIST DEVICE N/A 06/12/2017   Procedure: INSERTION OF IMPLANTABLE LEFT VENTRICULAR ASSIST [place] 3;  Surgeon: Ivin Poot, MD;  Location: [facility];  Service: Open Heart Surgery;  Laterality: N/A;  HM3 LVAD  CIRC ARREST  NITRIC OXIDE  . INSERTION OF IMPLANTABLE LEFT VENTRICULAR ASSIST DEVICE N/A 02/28/2018   Procedure: REDO INSERTION OF IMPLANTABLE LEFT VENTRICULAR ASSIST DEVICE - HEARTMATE 3;  Surgeon: Ivin Poot, MD;  Location: [facility];  Service: Open Heart Surgery;  Laterality: N/A;  . IR REMOVAL TUN CV CATH W/O FL  02/26/2018  . IR THORACENTESIS ASP PLEURAL SPACE W/IMG GUIDE  06/28/2017  . LIGATION OF ARTERIOVENOUS  FISTULA Right 10/31/2017   Procedure: LIGATION OF BRACHIO-BASILIC VEIN TRANSPOSITION RIGHT ARM;  Surgeon: Conrad Lahaina, MD;  Location: [facility];  Service: Vascular;  Laterality: Right;  . MULTIPLE EXTRACTIONS WITH ALVEOLOPLASTY N/A 06/08/2017   Procedure: MULTIPLE EXTRACTION WITH ALVEOLOPLASTY AND PRE PROSTHETIC SURGERY AS NEEDED;  Surgeon: Lenn Cal, DDS;  Location:  [facility];  Service: Oral Surgery;  Laterality: N/A;  . RIGHT HEART CATH N/A 06/07/2017   Procedure: RIGHT HEART CATH;  Surgeon: Larey Dresser, MD;  Location: [facility];  Service: Cardiovascular;  Laterality: N/A;  . RIGHT HEART CATH N/A 02/08/2018   Procedure: RIGHT HEART CATH;  Surgeon: Larey Dresser, MD;  Location: [facility];  Service: Cardiovascular;  Laterality: N/A;  . RIGHT/LEFT HEART CATH AND CORONARY ANGIOGRAPHY N/A 11/23/2016   Procedure: Right/Left Heart Cath and Coronary Angiography;  Surgeon: Larey Dresser, MD;  Location: [facility];  Service: Cardiovascular;  Laterality: N/A;  . TEE WITHOUT CARDIOVERSION N/A 06/12/2017   Procedure: TRANSESOPHAGEAL ECHOCARDIOGRAM (TEE);  Surgeon: Prescott Gum, Collier Salina, MD;  Location: [facility];  Service: Open Heart Surgery;  Laterality: N/A;  . TEE WITHOUT CARDIOVERSION N/A 02/28/2018   Procedure: TRANSESOPHAGEAL ECHOCARDIOGRAM (TEE);  Surgeon: Prescott Gum, Collier Salina, MD;  Location: [facility];  Service: Open Heart Surgery;  Laterality: N/A;  . TEMPORARY DIALYSIS CATHETER Left 05/24/2018   Procedure: TEMPORARY DIALYSIS CATHETER;  Surgeon: Marty Heck, MD;  Location: [facility];  Service: Cardiovascular;  Laterality: Left;  . THROMBECTOMY AND REVISION OF ARTERIOVENTOUS (AV) GORETEX  GRAFT Right 12/12/2017   Procedure: THROMBECTOMY ARTERIOVENTOUS (AV) GORETEX  GRAFT RIGHT UPPER ARM;  Surgeon: Conrad Walnut, MD;  Location: [facility];  Service: Vascular;  Laterality: Right;        Home Medications    Prior to Admission medications   Medication Sig Start Date End Date Taking? Authorizing Provider  aspirin EC 81 MG tablet Take 1 tablet (81 mg total) by mouth daily. 07/26/17   Larey Dresser, MD  atorvastatin (LIPITOR) 40 MG tablet Take 1 tablet (40 mg total) by mouth daily at 6 PM. 01/25/18   [name], MD  calcitRIOL (ROCALTROL) 0.5 MCG capsule Take 1 capsule (0.5 mcg total) by mouth every Monday, Wednesday, and Friday.  06/06/18   Georgiana Shore, NP  citalopram (CELEXA) 20 MG tablet Take 1 tablet (20 mg total) by mouth daily. 03/19/18   Georgiana Shore, NP  dronabinol (MARINOL) 2.5 MG capsule Take 1 capsule (2.5 mg total) by mouth 2 (two) times daily before lunch and supper. 03/27/18   Larey Dresser, MD  enoxaparin (LOVENOX) 80 MG/0.8ML injection Inject 0.75 mLs (75 mg total) into the skin every 12 (twelve) hours. 07/06/18   Larey Dresser, MD  erythromycin (E-MYCIN) 250 MG tablet Take 1 tablet (250 mg total) by mouth 3 (three) times daily. 03/25/18   Isaiah Serge, NP  insulin aspart (NOVOLOG) 100 UNIT/ML injection Inject 5 Units into the skin 3 (  three) times daily with meals. 01/22/18   Georgiana Shore, NP  insulin glargine (LANTUS) 100 UNIT/ML injection Inject 0.1 mLs (10 Units total) into the skin daily. 05/09/18   Lovenia Kim, MD  isosorbide-hydrALAZINE (BIDIL) 20-37.5 MG tablet Take 1.5 tablets by mouth every 8 (eight) hours. Take 1.5 tablets every 8 hours on non dialysis days. Patient taking differently: Take 1.5 tablets by mouth See admin instructions. Take 1.5 tablets every 8 hours on non dialysis days. 03/19/18   Georgiana Shore, NP  LORazepam (ATIVAN) 0.5 MG tablet Take 1 tablet (0.5 mg total) by mouth every 12 (twelve) hours as needed (nausea). 03/27/18   Larey Dresser, MD  metoCLOPramide (REGLAN) 10 MG tablet Take 1 tablet (10 mg total) by mouth 3 (three) times daily. 01/22/18   Georgiana Shore, NP  multivitamin (RENA-VIT) TABS tablet Take 1 tablet by mouth daily.    [provider]  ondansetron (ZOFRAN ODT) 4 MG disintegrating tablet Take 1 tablet (4 mg total) by mouth every 6 (six) hours as needed for nausea or vomiting. 01/10/17   Esterwood, Amy S, PA-C  pantoprazole (PROTONIX) 40 MG tablet Take 40 mg by mouth 2 (two) times daily.     [provider]  promethazine (PHENERGAN) 12.5 MG tablet Take 1 tablet (12.5 mg total) by mouth every 6 (six) hours as needed for nausea or vomiting.  07/06/18   Larey Dresser, MD  warfarin (COUMADIN) 5 MG tablet Take 5 mg (1 tablet) daily except 7.5 mg (1 and 1/2 tablets) on Monday    [provider]    Family History Family History  Problem Relation Age of Onset  . Colon polyps Mother   . Diabetes Mother   . Heart attack Father   . Diabetes Father   . Stroke Paternal Grandfather   . Colitis Sister   . Heart disease Brother   . Colitis Sister   . Colon cancer Neg Hx     Social History Social History   Tobacco Use  . Smoking status: Never Smoker  . Smokeless tobacco: Never Used  Substance Use Topics  . Alcohol use: No  . Drug use: No     Allergies   Metformin and related   Review of Systems Review of Systems 10 Systems reviewed and are negative for acute change except as noted in the HPI.  Physical Exam Updated Vital Signs BP 101/83   Pulse 94   Temp 98.7 F (37.1 C)   Ht 5' 7"  (1.702 m)   Wt 80 kg   SpO2 100%   BMI 27.62 kg/m   Physical Exam  Constitutional: He is oriented to person, place, and time.  Patient is alert and in no distress.  No respiratory distress.  Mental status clear.  HENT:  Head: Normocephalic and atraumatic.  Mouth/Throat: Oropharynx is clear and moist.  Eyes: EOM are normal.  Cardiovascular:  Continue his home of LVAD.  Pulmonary/Chest: Effort normal and breath sounds normal.  Abdominal: Soft. He exhibits no distension. There is no tenderness. There is no guarding.  Musculoskeletal: Normal range of motion. He exhibits no edema or tenderness.  Extremities are very thin.  No peripheral edema.  Calves are nontender.  Neurological: He is alert and oriented to person, place, and time. He exhibits normal muscle tone. Coordination normal.  Skin: Skin is warm and dry.  Psychiatric: He has a normal mood and affect.     ED Treatments / Results  Labs (all labs ordered are  listed, but only abnormal results are displayed) Labs Reviewed  BASIC METABOLIC PANEL - Abnormal;  Notable for the following components:      Result Value   Sodium 132 (*)    Potassium 3.1 (*)    Chloride 97 (*)    Glucose, Bld 134 (*)    BUN 31 (*)    Creatinine, Ser 6.02 (*)    Calcium 8.8 (*)    GFR calc non Af Amer 10 (*)    GFR calc Af Amer 11 (*)    All other components within normal limits  CBC  I-STAT TROPONIN, ED    EKG None  Radiology Dg Chest Port 1 View  Result Date: 07/29/2018 CLINICAL DATA:  Chest pain, ESRD on HD, LVAD, CHF, CAD, ischemic cardiomyopathy, type II diabetes mellitus EXAM: PORTABLE CHEST 1 VIEW COMPARISON:  Portable exam 1051 hours compared to 07/27/2018 FINDINGS: LEFT subclavian AICD leads project over RIGHT atrium and RIGHT ventricle. Enlargement of cardiac silhouette post coronary arterial stenting, median sternotomy and LVAD. Mediastinal contours and pulmonary vascularity normal. Lungs clear. No pulmonary infiltrate, pleural effusion or pneumothorax. IMPRESSION: Enlargement of cardiac silhouette post LVAD and AICD. No acute abnormalities. Electronically Signed   By: Lavonia Dana M.D.   On: 07/29/2018 11:12    Procedures Procedures (including critical care time)  Medications Ordered in ED Medications - No data to display   Initial Impression / Assessment and Plan / ED Course  I have reviewed the triage vital signs and the nursing notes.  Pertinent labs & imaging results that were available during my care of the patient were reviewed by me and considered in my medical decision making (see chart for details).     Consult: Reviewed with Dr. Debara Pickett.  Advised that patient has been rehydrated with 1 L normal saline and flow rate is averaging about 3.5.  He advises that sounds consistent with prior management.  As patient is stable with no symptoms and feels well, patient is okay for discharge and calling LVAD coordinator in the morning.  Patient arrives as outlined.  He is clinically well in appearance without complaints.  Final Clinical  Impressions(s) / ED Diagnoses   Final diagnoses:  Hypovolemia  LVAD (left ventricular assist device) present ([place])  ESRD (end stage renal disease) on dialysis [facility])    ED Discharge Orders    None       Charlesetta Shanks, MD 08/02/18 [phone]

## 2018-07-29 NOTE — ED Notes (Signed)
IV nurse bedside

## 2018-08-03 ENCOUNTER — Encounter (HOSPITAL_COMMUNITY): Payer: Self-pay

## 2018-08-03 ENCOUNTER — Telehealth (HOSPITAL_COMMUNITY): Payer: Self-pay | Admitting: *Deleted

## 2018-08-03 ENCOUNTER — Emergency Department (HOSPITAL_COMMUNITY)
Admission: EM | Admit: 2018-08-03 | Discharge: 2018-08-03 | Disposition: A | Discharge status: Home / Self Care | Payer: BLUE CROSS/BLUE SHIELD | Attending: Emergency Medicine | Admitting: Emergency Medicine

## 2018-08-03 DIAGNOSIS — N186 End stage renal disease: Secondary | ICD-10-CM | POA: Insufficient documentation

## 2018-08-03 DIAGNOSIS — Z794 Long term (current) use of insulin: Secondary | ICD-10-CM | POA: Diagnosis not present

## 2018-08-03 DIAGNOSIS — Z95811 Presence of heart assist device: Secondary | ICD-10-CM

## 2018-08-03 DIAGNOSIS — Y712 Prosthetic and other implants, materials and accessory cardiovascular devices associated with adverse incidents: Secondary | ICD-10-CM | POA: Insufficient documentation

## 2018-08-03 DIAGNOSIS — R1110 Vomiting, unspecified: Secondary | ICD-10-CM | POA: Insufficient documentation

## 2018-08-03 DIAGNOSIS — Z79899 Other long term (current) drug therapy: Secondary | ICD-10-CM | POA: Insufficient documentation

## 2018-08-03 DIAGNOSIS — Z992 Dependence on renal dialysis: Secondary | ICD-10-CM | POA: Diagnosis not present

## 2018-08-03 DIAGNOSIS — Z7901 Long term (current) use of anticoagulants: Secondary | ICD-10-CM | POA: Insufficient documentation

## 2018-08-03 DIAGNOSIS — I5022 Chronic systolic (congestive) heart failure: Secondary | ICD-10-CM | POA: Diagnosis not present

## 2018-08-03 DIAGNOSIS — T82198A Other mechanical complication of other cardiac electronic device, initial encounter: Secondary | ICD-10-CM | POA: Diagnosis present

## 2018-08-03 DIAGNOSIS — R112 Nausea with vomiting, unspecified: Secondary | ICD-10-CM

## 2018-08-03 DIAGNOSIS — I132 Hypertensive heart and chronic kidney disease with heart failure and with stage 5 chronic kidney disease, or end stage renal disease: Secondary | ICD-10-CM | POA: Diagnosis not present

## 2018-08-03 DIAGNOSIS — E1122 Type 2 diabetes mellitus with diabetic chronic kidney disease: Secondary | ICD-10-CM | POA: Diagnosis not present

## 2018-08-03 DIAGNOSIS — Z7982 Long term (current) use of aspirin: Secondary | ICD-10-CM | POA: Diagnosis not present

## 2018-08-03 DIAGNOSIS — R1111 Vomiting without nausea: Secondary | ICD-10-CM

## 2018-08-03 DIAGNOSIS — Z86718 Personal history of other venous thrombosis and embolism: Secondary | ICD-10-CM | POA: Insufficient documentation

## 2018-08-03 LAB — I-STAT CHEM 8, ED
BUN: 32 mg/dL — ABNORMAL HIGH (ref 6–20)
CALCIUM ION: 1.14 mmol/L — AB (ref 1.15–1.40)
Chloride: 99 mmol/L (ref 98–111)
Creatinine, Ser: 5.1 mg/dL — ABNORMAL HIGH (ref 0.61–1.24)
Glucose, Bld: 95 mg/dL (ref 70–99)
HEMATOCRIT: 37 % — AB (ref 39.0–52.0)
HEMOGLOBIN: 12.6 g/dL — AB (ref 13.0–17.0)
Potassium: 3.5 mmol/L (ref 3.5–5.1)
SODIUM: 135 mmol/L (ref 135–145)
TCO2: 30 mmol/L (ref 22–32)

## 2018-08-03 MED ORDER — HEPARIN SODIUM (PORCINE) 1000 UNIT/ML IJ SOLN
1700.0000 [IU] | Freq: Once | INTRAMUSCULAR | Status: AC
  Administered 2018-08-03: 1700 [IU] via INTRAVENOUS

## 2018-08-03 NOTE — Telephone Encounter (Signed)
Pt called VAD pager to report he is at dialysis center. He says he is up 6 kg from Tuesday. He denies any HF symptoms or vad alarms. His speed is 5550, flow 3.1, PI 7.6, power 4.5. He feels "fine". No interventions needed at this time per Dr. Haroldine Laws.  Zada Girt RN VAD Coordinator 24/7 VAD Pager: [phone]

## 2018-08-03 NOTE — Telephone Encounter (Signed)
Dialysis center called VAD pager to report pt has had 2 low flow alarms and is vomiting; they have already called EMS for transport to [facility] ED.  Nurse reports they have pulled 2.8 liters when alarms occurred and pt became nauseated. Current BP is 111/83 (92), HR 82. Nurse returned 400 ccs back to pt.  Spoke with pt, he feels "ok", speed 5500, flow 3.8, PI 5.3, power 4.4.  Called Dr. Haroldine Laws, ED charge, and [facility].  Called ED nurse after patient arrived and asked her to add PT/INR and LDH to ED labs. Asked her to have ED doctor call Dr. Haroldine Laws with asessment and lab results.  Zada Girt RN, VAD Coordiantor 24/7 VAD pager: [phone]

## 2018-08-03 NOTE — Discharge Instructions (Addendum)
The LVAD team will follow up with you.  Return if concern for any reason

## 2018-08-03 NOTE — ED Triage Notes (Addendum)
Pt from dialysis via ems; LVAD pt, low flow alarm activated twice in 1 week, once activated today at dialysis; pt dialysis staff, pt had significant fluid gain over past couple of days; 54 minutes left of treatment when ems called; sinus on monitor w/ ems; c/o some nausea during dialysis; denies cp and sob  100% 2L CBG 158 97/52 HR 60's

## 2018-08-03 NOTE — ED Notes (Addendum)
LVAD coordinator paged

## 2018-08-03 NOTE — Consult Note (Signed)
Advanced Heart Failure Team ER Note   Primary Physician: [name] Kim, MD PCP-Cardiologist:  No primary care provider on file.  Requesting Physician: Dr. Orlie [name]  Reason for Consultation: Heart Failure   HPI:    [name] is seen today for evaluation of low flow alarm on  LVAD at the request of Dr [name].    [name] is a 52 y.o. with history of CAD s/p anterior MI in 2010 and NSTEMI in 3/18 with DES to pRCA and mLAD, diabetic, gastroparesis, Chrons disease, DMII and ischemic cardiomyopathy, 06/2017 s/p Heartmate 3 LVAD and ESRD. He has LUE AVR 05/2018 .   He has had multiple ER visits and admits for recurrent nausea and low flow alarms with HD.   Ramp Echo   03/2018   Pump Flow 4.1, speed 5500 RPM, pulse index 3.5, power 4.4  LV cannula appears well positioned.  Seen in ER on 11/22 after he developed chest pain and nausea. He improved with IVF,ativan and phenrgan. Was discharged home.  Has been feeling fine lately. Went to HD today and told he was 6kg up from dry weight but denied edema, orthopnea or PND. Plan was to remove 3.6 liters (typically is 2.8L). They removed 2.8L but when they continued pulling he developed 2 low flow alarms and nausea/emesis. Gave back 400cc of fluid but transported to ER for VAD evaluation.   Says he feels fine now. No dizziness, nausea, SOB or emesis. MAPs 80-90s.   VAD interrogation Speed 5500 PP 4.5 PF 3.7 PI 5.0 2 low flow events at 11:19a with transient power spike to 10.4. No further events. VAD interrogation goes back to 11/126 and no other low flow alarms.    Labs ok  K 3.5 hgb 12.6  Review of Systems: [y] = yes, [ ]  = no   General: Weight gain Blue.Reese ]; Weight loss [ ] ; Anorexia [ ] ; Fatigue [ ] ; Fever [ ] ; Chills [ ] ; Weakness []  Cardiac: Chest pain/pressure [ ] ; Resting SOB [ ] ; Exertional SOB [ ] ; Orthopnea [ ] ; Pedal Edema [ ] ; Palpitations [ ] ; Syncope [ ] ; Presyncope [ ] ; Paroxysmal nocturnal dyspnea[ ]     Pulmonary: Cough [ ] ; Wheezing[ ] ; Hemoptysis[ ] ; Sputum [ ] ; Snoring [ ]   GI: Vomiting[Y ]; Dysphagia[ ] ; Melena[ ] ; Hematochezia [ ] ; Heartburn[ ] ; Abdominal pain [ ] ; Constipation [ ] ; Diarrhea [ ] ; BRBPR [ ]   GU: Hematuria[ ] ; Dysuria [ ] ; Nocturia[ ]   Vascular: Pain in legs with walking [ ] ; Pain in feet with lying flat [ ] ; Non-healing sores [ ] ; Stroke [ ] ; TIA [ ] ; Slurred speech [ ] ;  Neuro: Headaches[ ] ; Vertigo[ ] ; Seizures[ ] ; Paresthesias[ ] ;Blurred vision [ ] ; Diplopia [ ] ; Vision changes [ ]   Ortho/Skin: Arthritis Blue.Reese ]; Joint pain Blue.Reese ]; Muscle pain [ ] ; Joint swelling [ ] ; Back Pain [ ] ; Rash [ ]   Psych: Depression[ ] ; Anxiety[ ]   Heme: Bleeding problems [ ] ; Clotting disorders [ ] ; Anemia [ ]   Endocrine: Diabetes [ Y]; Thyroid dysfunction[ ]   Home Medications Prior to Admission medications   Medication Sig Start Date End Date Taking? Authorizing Provider  aspirin EC 81 MG tablet Take 1 tablet (81 mg total) by mouth daily. 07/26/17   Larey Dresser, MD  atorvastatin (LIPITOR) 40 MG tablet Take 1 tablet (40 mg total) by mouth daily at 6 PM. 01/25/18   [name], MD  calcitRIOL (ROCALTROL) 0.5 MCG capsule Take 1  capsule (0.5 mcg total) by mouth every Monday, Wednesday, and Friday. 06/06/18   Georgiana Shore, NP  citalopram (CELEXA) 20 MG tablet Take 1 tablet (20 mg total) by mouth daily. 03/19/18   Georgiana Shore, NP  dronabinol (MARINOL) 2.5 MG capsule Take 1 capsule (2.5 mg total) by mouth 2 (two) times daily before lunch and supper. 03/27/18   Larey Dresser, MD  enoxaparin (LOVENOX) 80 MG/0.8ML injection Inject 0.75 mLs (75 mg total) into the skin every 12 (twelve) hours. 07/06/18   Larey Dresser, MD  erythromycin (E-MYCIN) 250 MG tablet Take 1 tablet (250 mg total) by mouth 3 (three) times daily. 03/25/18   Isaiah Serge, NP  insulin aspart (NOVOLOG) 100 UNIT/ML injection Inject 5 Units into the skin 3 (three) times daily with meals. 01/22/18   Georgiana Shore, NP  insulin glargine (LANTUS) 100 UNIT/ML injection Inject 0.1 mLs (10 Units total) into the skin daily. 05/09/18   [name] Kim, MD  isosorbide-hydrALAZINE (BIDIL) 20-37.5 MG tablet Take 1.5 tablets by mouth every 8 (eight) hours. Take 1.5 tablets every 8 hours on non dialysis days. Patient taking differently: Take 1.5 tablets by mouth See admin instructions. Take 1.5 tablets every 8 hours on non dialysis days. 03/19/18   Georgiana Shore, NP  LORazepam (ATIVAN) 0.5 MG tablet Take 1 tablet (0.5 mg total) by mouth every 12 (twelve) hours as needed (nausea). 03/27/18   Larey Dresser, MD  metoCLOPramide (REGLAN) 10 MG tablet Take 1 tablet (10 mg total) by mouth 3 (three) times daily. 01/22/18   Georgiana Shore, NP  multivitamin (RENA-VIT) TABS tablet Take 1 tablet by mouth daily.    [provider]  ondansetron (ZOFRAN ODT) 4 MG disintegrating tablet Take 1 tablet (4 mg total) by mouth every 6 (six) hours as needed for nausea or vomiting. 01/10/17   Esterwood, Amy S, PA-C  pantoprazole (PROTONIX) 40 MG tablet Take 40 mg by mouth 2 (two) times daily.     [provider]  promethazine (PHENERGAN) 12.5 MG tablet Take 1 tablet (12.5 mg total) by mouth every 6 (six) hours as needed for nausea or vomiting. 07/06/18   Larey Dresser, MD  warfarin (COUMADIN) 5 MG tablet Take 5 mg (1 tablet) daily except 7.5 mg (1 and 1/2 tablets) on Monday    [provider]    Past Medical History: Past Medical History:  Diagnosis Date  . Angina   . ASCVD (arteriosclerotic cardiovascular disease)    , Anterior infarction 2005, LAD diagonal bifurcation intervention 03/2004  . Automatic implantable cardiac defibrillator -St. Jude's       . Benign neoplasm of colon   . CHF (congestive heart failure) ([place])   . Chronic systolic heart failure ([place])   . Coronary artery disease     Widely patent previously placed stents in the left anterior   . Crohn's disease ([place])   . Deep venous thrombosis  ([facility])    Recurrent-on Coumadin  . Dialysis patient ([place])   . Dyspnea   . Gastroparesis   . GERD (gastroesophageal reflux disease)   . High cholesterol   . Hyperlipidemia   . Hypersomnolent    Previous diagnosis of narcolepsy  . Hypertension, essential   . Ischemic cardiomyopathy    Ejection fraction 15-20% catheterization 2010  . Type II or unspecified type diabetes mellitus without mention of complication, not stated as uncontrolled   . Unspecified gastritis and gastroduodenitis without mention of hemorrhage  Past Surgical History: Past Surgical History:  Procedure Laterality Date  . A/V SHUNTOGRAM N/A 05/24/2018   Procedure: A/V SHUNTOGRAM;  Surgeon: Marty Heck, MD;  Location: [facility];  Service: Cardiovascular;  Laterality: N/A;  . APPENDECTOMY    . AV FISTULA PLACEMENT Right 06/26/2017   Procedure: ARTERIOVENOUS (AV) FISTULA CREATION VERSUS GRAFT RIGHT  ARM;  Surgeon: Conrad [place], MD;  Location: [facility];  Service: Vascular;  Laterality: Right;  . AV FISTULA PLACEMENT Right 10/31/2017   Procedure: INSERTION OF ARTERIOVENOUS (AV) ARTEGRAFT,  RIGHT UPPER ARM;  Surgeon: Conrad [place], MD;  Location: [facility];  Service: Vascular;  Laterality: Right;  . AV FISTULA PLACEMENT Left 05/29/2018   Procedure: ARTERIOVENOUS (AV) FISTULA CREATION  LEFT UPPER EXTREMITY;  Surgeon: Waynetta Sandy, MD;  Location: [facility];  Service: Vascular;  Laterality: Left;  . CARDIAC DEFIBRILLATOR PLACEMENT  2010   St. Jude ICD  . CENTRAL LINE INSERTION Right 05/24/2018   Procedure: CENTRAL LINE INSERTION;  Surgeon: Marty Heck, MD;  Location: [facility];  Service: Cardiovascular;  Laterality: Right;  . COLECTOMY  ~ 2003   "for Crohn's" ascending colon.    . CORONARY STENT INTERVENTION N/A 11/25/2016   Procedure: Coronary Stent Intervention;  Surgeon: Leonie Man, MD;  Location: [facility];  Service: Cardiovascular;  Laterality: N/A;  . EP IMPLANTABLE  DEVICE N/A 09/14/2016   Procedure: ICD Generator Changeout;  Surgeon: Deboraha Sprang, MD;  Location: [facility];  Service: Cardiovascular;  Laterality: N/A;  . ESOPHAGOGASTRODUODENOSCOPY N/A 07/12/2017   Procedure: ESOPHAGOGASTRODUODENOSCOPY (EGD);  Surgeon: Jerene Bears, MD;  Location: [facility];  Service: Gastroenterology;  Laterality: N/A;  VAD pt so VAD team will need to accompany pt.   [name] FETAL SURGERY FOR CONGENITAL HERNIA     ????? pt has no knowledge of this..    . INGUINAL HERNIA REPAIR    . INSERTION OF DIALYSIS CATHETER Right 06/26/2017   Procedure: INSERTION OF TUNNELED  DIALYSIS CATHETER RIGHT INTERNAL JUGULAR;  Surgeon: Conrad Higbee, MD;  Location: [facility];  Service: Vascular;  Laterality: Right;  . INSERTION OF IMPLANTABLE LEFT VENTRICULAR ASSIST DEVICE N/A 06/12/2017   Procedure: INSERTION OF IMPLANTABLE LEFT VENTRICULAR ASSIST [place] 3;  Surgeon: Ivin Poot, MD;  Location: [facility];  Service: Open Heart Surgery;  Laterality: N/A;  HM3 LVAD  CIRC ARREST  NITRIC OXIDE  . INSERTION OF IMPLANTABLE LEFT VENTRICULAR ASSIST DEVICE N/A 02/28/2018   Procedure: REDO INSERTION OF IMPLANTABLE LEFT VENTRICULAR ASSIST DEVICE - HEARTMATE 3;  Surgeon: Ivin Poot, MD;  Location: [facility];  Service: Open Heart Surgery;  Laterality: N/A;  . IR REMOVAL TUN CV CATH W/O FL  02/26/2018  . IR THORACENTESIS ASP PLEURAL SPACE W/IMG GUIDE  06/28/2017  . LIGATION OF ARTERIOVENOUS  FISTULA Right 10/31/2017   Procedure: LIGATION OF BRACHIO-BASILIC VEIN TRANSPOSITION RIGHT ARM;  Surgeon: Conrad [place], MD;  Location: [facility];  Service: Vascular;  Laterality: Right;  . MULTIPLE EXTRACTIONS WITH ALVEOLOPLASTY N/A 06/08/2017   Procedure: MULTIPLE EXTRACTION WITH ALVEOLOPLASTY AND PRE PROSTHETIC SURGERY AS NEEDED;  Surgeon: Lenn Cal, DDS;  Location: [facility];  Service: Oral Surgery;  Laterality: N/A;  . RIGHT HEART CATH N/A 06/07/2017   Procedure: RIGHT HEART CATH;  Surgeon: Larey Dresser, MD;  Location: [facility];  Service: Cardiovascular;  Laterality: N/A;  . RIGHT HEART CATH N/A 02/08/2018   Procedure: RIGHT HEART  CATH;  Surgeon: Larey Dresser, MD;  Location: [facility];  Service: Cardiovascular;  Laterality: N/A;  . RIGHT/LEFT HEART CATH AND CORONARY ANGIOGRAPHY N/A 11/23/2016   Procedure: Right/Left Heart Cath and Coronary Angiography;  Surgeon: Larey Dresser, MD;  Location: [facility];  Service: Cardiovascular;  Laterality: N/A;  . TEE WITHOUT CARDIOVERSION N/A 06/12/2017   Procedure: TRANSESOPHAGEAL ECHOCARDIOGRAM (TEE);  Surgeon: Prescott Gum, Collier Salina, MD;  Location: [facility];  Service: Open Heart Surgery;  Laterality: N/A;  . TEE WITHOUT CARDIOVERSION N/A 02/28/2018   Procedure: TRANSESOPHAGEAL ECHOCARDIOGRAM (TEE);  Surgeon: Prescott Gum, Collier Salina, MD;  Location: [facility];  Service: Open Heart Surgery;  Laterality: N/A;  . TEMPORARY DIALYSIS CATHETER Left 05/24/2018   Procedure: TEMPORARY DIALYSIS CATHETER;  Surgeon: Marty Heck, MD;  Location: [facility];  Service: Cardiovascular;  Laterality: Left;  . THROMBECTOMY AND REVISION OF ARTERIOVENTOUS (AV) GORETEX  GRAFT Right 12/12/2017   Procedure: THROMBECTOMY ARTERIOVENTOUS (AV) GORETEX  GRAFT RIGHT UPPER ARM;  Surgeon: Conrad Kirkland, MD;  Location: [facility];  Service: Vascular;  Laterality: Right;    Family History: Family History  Problem Relation Age of Onset  . Colon polyps Mother   . Diabetes Mother   . Heart attack Father   . Diabetes Father   . Stroke Paternal Grandfather   . Colitis Sister   . Heart disease Brother   . Colitis Sister   . Colon cancer Neg Hx     Social History: Social History   Socioeconomic History  . Marital status: Married    Spouse name: Not on file  . Number of children: Not on file  . Years of education: Not on file  . Highest education level: Not on file  Occupational History  . Not on file  Social Needs  . Financial resource strain: Not on  file  . Food insecurity:    Worry: Not on file    Inability: Not on file  . Transportation needs:    Medical: Not on file    Non-medical: Not on file  Tobacco Use  . Smoking status: Never Smoker  . Smokeless tobacco: Never Used  Substance and Sexual Activity  . Alcohol use: No  . Drug use: No  . Sexual activity: Never  Lifestyle  . Physical activity:    Days per week: Not on file    Minutes per session: Not on file  . Stress: Not on file  Relationships  . Social connections:    Talks on phone: Not on file    Gets together: Not on file    Attends religious service: Not on file    Active member of club or organization: Not on file    Attends meetings of clubs or organizations: Not on file    Relationship status: Not on file  Other Topics Concern  . Not on file  Social History Narrative  . Not on file    Allergies:  Allergies  Allergen Reactions  . Metformin And Related Diarrhea    Objective:    Vital Signs:   Temp:  [98.1 F (36.7 C)] 98.1 F (36.7 C) (11/29 1112) Pulse Rate:  [81-90] 87 (11/29 1354) Resp:  [14-20] 16 (11/29 1354) BP: (113-119)/(89-93) 113/93 (11/29 1354) SpO2:  [100 %] 100 % (11/29 1354) Weight:  [86 kg] 86 kg (11/29 1108)  MAP 80-90s  Weight change: Filed Weights   08/03/18 1108  Weight: 86 kg    Intake/Output:  No intake  or output data in the 24 hours ending 08/03/18 1411    Physical Exam    Physical Exam: General:  NAD.  HEENT: normal  Neck: supple. JVP not elevated.  Carotids 2+ bilat; no bruits. No lymphadenopathy or thryomegaly appreciated. Cor: LVAD hum.  Lungs: Clear. Abdomen:   soft, nontender, non-distended. No hepatosplenomegaly. No bruits or masses. Good bowel sounds. Driveline site clean. Anchor in place.  Extremities: no cyanosis, clubbing, rash. Warm no edema  RUE AVF Neuro: alert & oriented x 3. No focal deficits. Moves all 4 without problem     Telemetry   Sinus Rhythm -Sinus 80-90s Personally  reviewed   EKG   N/A    Labs   Basic Metabolic Panel: Recent Labs  Lab 07/29/18 1108 08/03/18 1216  NA 132* 135  K 3.1* 3.5  CL 97* 99  CO2 22  --   GLUCOSE 134* 95  BUN 31* 32*  CREATININE 6.02* 5.10*  CALCIUM 8.8*  --     Liver Function Tests: No results for input(s): AST, ALT, ALKPHOS, BILITOT, PROT, ALBUMIN in the last 168 hours. No results for input(s): LIPASE, AMYLASE in the last 168 hours. No results for input(s): AMMONIA in the last 168 hours.  CBC: Recent Labs  Lab 07/29/18 1108 08/03/18 1216  WBC 9.5  --   HGB 13.0 12.6*  HCT 40.3 37.0*  MCV 87.2  --   PLT 217  --     Cardiac Enzymes: No results for input(s): CKTOTAL, CKMB, CKMBINDEX, TROPONINI in the last 168 hours.  BNP: BNP (last 3 results) Recent Labs    03/02/18 0400 03/08/18 0008 03/15/18 0106  BNP 694.1* 1,576.6* 811.3*    ProBNP (last 3 results) No results for input(s): PROBNP in the last 8760 hours.   CBG: No results for input(s): GLUCAP in the last 168 hours.  Coagulation Studies: No results for input(s): LABPROT, INR in the last 72 hours.   Imaging   No results found.     Patient Profile   [name] Matsuura is a 52 y.o. with history of CAD s/p anterior MI in 2010 and NSTEMI in 3/18 with DES to pRCA and mLAD, diabetic, gastroparesis, Chrons disease, DMII and ischemic cardiomyopathy, 06/2017 s/p Heartmate 3 LVAD and ESRD. He has LUE AVR 05/2018 .   Presents with transient nausea and low flows with HD.   Assessment/Plan   1. Nausea and low flow event on VAD - likely due to aggressive volume removal with HD. Now resolved with IVF.  - dry weight recently decreased. Suspect this needs to be increased back up. - VAD interrogated personally. Parameters stable. 2. Chronic gastroparesis   - stable. continue up erythromycin + reglan.  3. ESRD - stable. As above will need dry weight increased  4. Chronic Systolic Heart Failure, HMIII - volume status improved with IVF. Low flow  alarms resolved. VAD parameters and MAPs otherwise stable  5. HTN - Well controlled on Bidil   I discussed with Dr. Winfred [name] personally in ER. Ok to send him home. Will follow closely in VAD Clinic  Length of Stay: 0  [name], MD  08/03/2018, 2:11 PM  Advanced Heart Failure Team Pager [phone] (M-F; 7a - 4p)  Please contact [facility] for night-coverage after hours (4p -7a ) and weekends on [email or web address]

## 2018-08-03 NOTE — ED Notes (Signed)
Patient verbalizes understanding of discharge instructions. Opportunity for questioning and answers were provided. Pt discharged from ED. 

## 2018-08-03 NOTE — ED Provider Notes (Signed)
[name] Provider Note   CSN: [id number] Arrival date & time: 08/03/18  1102     History   Chief Complaint No chief complaint on file. Complaint LVAD alarm  HPI [name] is a 52 y.o. male.  HPI patient is LVAD alarmed today while at hemodialysis session.  Patient asymptomatic.  He had vomited at dialysis earlier today.  Denies lightheadedness denies shortness of breath denies any complaint.  No treatment prior to coming here.  He had 54 minutes remaining on his dialysis session when dialysis session was stopped to bring him here.  Past Medical History:  Diagnosis Date  . Angina   . ASCVD (arteriosclerotic cardiovascular disease)    , Anterior infarction 2005, LAD diagonal bifurcation intervention 03/2004  . Automatic implantable cardiac defibrillator -St. Jude's       . Benign neoplasm of colon   . CHF (congestive heart failure) ([place])   . Chronic systolic heart failure ([place])   . Coronary artery disease     Widely patent previously placed stents in the left anterior   . Crohn's disease ([place])   . 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 Dialysis patient ([place])   . Dyspnea   . Gastroparesis   . GERD (gastroesophageal reflux disease)   . High cholesterol   . Hyperlipidemia   . Hypersomnolent    Previous diagnosis of narcolepsy  . Hypertension, essential   . Ischemic cardiomyopathy    Ejection fraction 15-20% catheterization 2010  . Type II or unspecified type diabetes mellitus without mention of complication, not stated as uncontrolled   . Unspecified gastritis and gastroduodenitis without mention of hemorrhage     Patient Active Problem List   Diagnosis Date Noted  . Nausea & vomiting 06/11/2018  . Heart failure ([place]) 05/23/2018  . Hypertensive urgency 03/25/2018  . Benign essential HTN   . Crohn's disease with complication ([place])   . Diabetes mellitus type 2 in nonobese (HCC)   . Anemia of  chronic disease   . Sinus tachycardia   . Leukocytosis   . Left ventricular assist device (LVAD) complication 86/76/1951  . Left ventricular assist device (LVAD) complication, initial encounter 02/28/2018  . Factor V Leiden carrier ([place]) 01/10/2018  . History of creation of ostomy ([place]) 01/10/2018  . Hypercoagulable state ([place]) 01/10/2018  . ESRD (end stage renal disease) ([place]) 12/11/2017  . Left ventricular assist device present ([place]) 12/05/2017  . ESRD (end stage renal disease) on dialysis ([place]) 10/29/2017  . Nausea with vomiting 09/12/2017  . Intractable nausea and vomiting 07/31/2017  . 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 LVAD (left ventricular assist device) present ([place])   . Palliative care by specialist   . Chronic systolic CHF (congestive heart failure) ([place]) 06/07/2017  . ACP (advance care planning)   . Encounter for central line placement   . Diarrhea   . Palliative care encounter   . Nausea   . CHF exacerbation ([place]) 04/05/2017  . Diabetic keto-acidosis ([place]) 04/05/2017  . Type 2 diabetes mellitus with complication, with long-term current use of insulin ([place]) 03/08/2017  . ESRD on dialysis ([place]) 01/17/2017  . AKI (acute kidney injury) ([place])   . Non-ST elevation (NSTEMI) myocardial infarction ([place])   . CHF (congestive heart failure) ([place]) 09/02/2016  . Elevated serum creatinine   . Encounter for therapeutic drug monitoring 08/03/2016  . Chest pain 04/26/2012  . SVT (supraventricular tachycardia) ([place]) 04/26/2012  .  Gastroparesis 11/18/2011  . Nausea and vomiting in adult 11/17/2011  . Coronary artery disease   . Ischemic cardiomyopathy   . Essential hypertension   . Automatic implantable cardioverter-defibrillator in situ   . Deep venous thrombosis ([place])   . Hypersomnolent   . POLYP, COLON 09/25/2008  . Type 2 diabetes mellitus ([place]) 09/25/2008  . Dyslipidemia, goal LDL below 70 09/25/2008  . GASTRITIS 09/25/2008  .  DIVERTICULOSIS, COLON 09/25/2008  . RECTAL MASS 09/25/2008  . History of Crohn's disease 09/05/2006    Past Surgical History:  Procedure Laterality Date  . A/V SHUNTOGRAM N/A 05/24/2018   Procedure: A/V SHUNTOGRAM;  Surgeon: Marty Heck, MD;  Location: [facility];  Service: Cardiovascular;  Laterality: N/A;  . APPENDECTOMY    . AV FISTULA PLACEMENT Right 06/26/2017   Procedure: ARTERIOVENOUS (AV) FISTULA CREATION VERSUS GRAFT RIGHT  ARM;  Surgeon: Conrad [place], MD;  Location: [facility];  Service: Vascular;  Laterality: Right;  . AV FISTULA PLACEMENT Right 10/31/2017   Procedure: INSERTION OF ARTERIOVENOUS (AV) ARTEGRAFT,  RIGHT UPPER ARM;  Surgeon: Conrad [place], MD;  Location: [facility];  Service: Vascular;  Laterality: Right;  . AV FISTULA PLACEMENT Left 05/29/2018   Procedure: ARTERIOVENOUS (AV) FISTULA CREATION  LEFT UPPER EXTREMITY;  Surgeon: Waynetta Sandy, MD;  Location: [facility];  Service: Vascular;  Laterality: Left;  . CARDIAC DEFIBRILLATOR PLACEMENT  2010   St. Jude ICD  . CENTRAL LINE INSERTION Right 05/24/2018   Procedure: CENTRAL LINE INSERTION;  Surgeon: Marty Heck, MD;  Location: [facility];  Service: Cardiovascular;  Laterality: Right;  . COLECTOMY  ~ 2003   "for Crohn's" ascending colon.    . CORONARY STENT INTERVENTION N/A 11/25/2016   Procedure: Coronary Stent Intervention;  Surgeon: Leonie Man, MD;  Location: [facility];  Service: Cardiovascular;  Laterality: N/A;  . EP IMPLANTABLE DEVICE N/A 09/14/2016   Procedure: ICD Generator Changeout;  Surgeon: Deboraha Sprang, MD;  Location: [facility];  Service: Cardiovascular;  Laterality: N/A;  . ESOPHAGOGASTRODUODENOSCOPY N/A 07/12/2017   Procedure: ESOPHAGOGASTRODUODENOSCOPY (EGD);  Surgeon: Jerene Bears, MD;  Location: [facility];  Service: Gastroenterology;  Laterality: N/A;  VAD pt so VAD team will need to accompany pt.   [name] FETAL SURGERY FOR CONGENITAL HERNIA      ????? pt has no knowledge of this..    . INGUINAL HERNIA REPAIR    . INSERTION OF DIALYSIS CATHETER Right 06/26/2017   Procedure: INSERTION OF TUNNELED  DIALYSIS CATHETER RIGHT INTERNAL JUGULAR;  Surgeon: Conrad Sweeny, MD;  Location: [facility];  Service: Vascular;  Laterality: Right;  . INSERTION OF IMPLANTABLE LEFT VENTRICULAR ASSIST DEVICE N/A 06/12/2017   Procedure: INSERTION OF IMPLANTABLE LEFT VENTRICULAR ASSIST [place] 3;  Surgeon: Ivin Poot, MD;  Location: [facility];  Service: Open Heart Surgery;  Laterality: N/A;  HM3 LVAD  CIRC ARREST  NITRIC OXIDE  . INSERTION OF IMPLANTABLE LEFT VENTRICULAR ASSIST DEVICE N/A 02/28/2018   Procedure: REDO INSERTION OF IMPLANTABLE LEFT VENTRICULAR ASSIST DEVICE - HEARTMATE 3;  Surgeon: Ivin Poot, MD;  Location: [facility];  Service: Open Heart Surgery;  Laterality: N/A;  . IR REMOVAL TUN CV CATH W/O FL  02/26/2018  . IR THORACENTESIS ASP PLEURAL SPACE W/IMG GUIDE  06/28/2017  . LIGATION OF ARTERIOVENOUS  FISTULA Right 10/31/2017   Procedure: LIGATION OF BRACHIO-BASILIC VEIN TRANSPOSITION RIGHT ARM;  Surgeon: Conrad Hardy, MD;  Location: [facility];  Service: Vascular;  Laterality: Right;  . MULTIPLE EXTRACTIONS WITH ALVEOLOPLASTY N/A 06/08/2017   Procedure: MULTIPLE EXTRACTION WITH ALVEOLOPLASTY AND PRE PROSTHETIC SURGERY AS NEEDED;  Surgeon: Lenn Cal, DDS;  Location: [facility];  Service: Oral Surgery;  Laterality: N/A;  . RIGHT HEART CATH N/A 06/07/2017   Procedure: RIGHT HEART CATH;  Surgeon: Larey Dresser, MD;  Location: [facility];  Service: Cardiovascular;  Laterality: N/A;  . RIGHT HEART CATH N/A 02/08/2018   Procedure: RIGHT HEART CATH;  Surgeon: Larey Dresser, MD;  Location: [facility];  Service: Cardiovascular;  Laterality: N/A;  . RIGHT/LEFT HEART CATH AND CORONARY ANGIOGRAPHY N/A 11/23/2016   Procedure: Right/Left Heart Cath and Coronary Angiography;  Surgeon: Larey Dresser, MD;  Location: [facility];   Service: Cardiovascular;  Laterality: N/A;  . TEE WITHOUT CARDIOVERSION N/A 06/12/2017   Procedure: TRANSESOPHAGEAL ECHOCARDIOGRAM (TEE);  Surgeon: Prescott Gum, Collier [place], MD;  Location: [facility];  Service: Open Heart Surgery;  Laterality: N/A;  . TEE WITHOUT CARDIOVERSION N/A 02/28/2018   Procedure: TRANSESOPHAGEAL ECHOCARDIOGRAM (TEE);  Surgeon: Prescott Gum, Collier [place], MD;  Location: [facility];  Service: Open Heart Surgery;  Laterality: N/A;  . TEMPORARY DIALYSIS CATHETER Left 05/24/2018   Procedure: TEMPORARY DIALYSIS CATHETER;  Surgeon: Marty Heck, MD;  Location: [facility];  Service: Cardiovascular;  Laterality: Left;  . THROMBECTOMY AND REVISION OF ARTERIOVENTOUS (AV) GORETEX  GRAFT Right 12/12/2017   Procedure: THROMBECTOMY ARTERIOVENTOUS (AV) GORETEX  GRAFT RIGHT UPPER ARM;  Surgeon: Conrad [place], MD;  Location: [facility];  Service: Vascular;  Laterality: Right;        Home Medications    Prior to Admission medications   Medication Sig Start Date End Date Taking? Authorizing Provider  aspirin EC 81 MG tablet Take 1 tablet (81 mg total) by mouth daily. 07/26/17   Larey Dresser, MD  atorvastatin (LIPITOR) 40 MG tablet Take 1 tablet (40 mg total) by mouth daily at 6 PM. 01/25/18   [name], MD  calcitRIOL (ROCALTROL) 0.5 MCG capsule Take 1 capsule (0.5 mcg total) by mouth every Monday, Wednesday, and Friday. 06/06/18   Georgiana Shore, NP  citalopram (CELEXA) 20 MG tablet Take 1 tablet (20 mg total) by mouth daily. 03/19/18   Georgiana Shore, NP  dronabinol (MARINOL) 2.5 MG capsule Take 1 capsule (2.5 mg total) by mouth 2 (two) times daily before lunch and supper. 03/27/18   Larey Dresser, MD  enoxaparin (LOVENOX) 80 MG/0.8ML injection Inject 0.75 mLs (75 mg total) into the skin every 12 (twelve) hours. 07/06/18   Larey Dresser, MD  erythromycin (E-MYCIN) 250 MG tablet Take 1 tablet (250 mg total) by mouth 3 (three) times daily. 03/25/18   Isaiah Serge, NP  insulin aspart  (NOVOLOG) 100 UNIT/ML injection Inject 5 Units into the skin 3 (three) times daily with meals. 01/22/18   Georgiana Shore, NP  insulin glargine (LANTUS) 100 UNIT/ML injection Inject 0.1 mLs (10 Units total) into the skin daily. 05/09/18   Lovenia Kim, MD  isosorbide-hydrALAZINE (BIDIL) 20-37.5 MG tablet Take 1.5 tablets by mouth every 8 (eight) hours. Take 1.5 tablets every 8 hours on non dialysis days. Patient taking differently: Take 1.5 tablets by mouth See admin instructions. Take 1.5 tablets every 8 hours on non dialysis days. 03/19/18   Georgiana Shore, NP  LORazepam (ATIVAN) 0.5 MG tablet Take 1 tablet (0.5 mg total) by mouth every 12 (twelve) hours  as needed (nausea). 03/27/18   Larey Dresser, MD  metoCLOPramide (REGLAN) 10 MG tablet Take 1 tablet (10 mg total) by mouth 3 (three) times daily. 01/22/18   Georgiana Shore, NP  multivitamin (RENA-VIT) TABS tablet Take 1 tablet by mouth daily.    [provider]  ondansetron (ZOFRAN ODT) 4 MG disintegrating tablet Take 1 tablet (4 mg total) by mouth every 6 (six) hours as needed for nausea or vomiting. 01/10/17   Esterwood, Amy S, PA-C  pantoprazole (PROTONIX) 40 MG tablet Take 40 mg by mouth 2 (two) times daily.     [provider]  promethazine (PHENERGAN) 12.5 MG tablet Take 1 tablet (12.5 mg total) by mouth every 6 (six) hours as needed for nausea or vomiting. 07/06/18   Larey Dresser, MD  warfarin (COUMADIN) 5 MG tablet Take 5 mg (1 tablet) daily except 7.5 mg (1 and 1/2 tablets) on Monday    [provider]    Family History Family History  Problem Relation Age of Onset  . Colon polyps Mother   . Diabetes Mother   . Heart attack Father   . Diabetes Father   . Stroke Paternal Grandfather   . Colitis Sister   . Heart disease Brother   . Colitis Sister   . Colon cancer Neg Hx     Social History Social History   Tobacco Use  . Smoking status: Never Smoker  . Smokeless tobacco: Never Used  Substance Use  Topics  . Alcohol use: No  . Drug use: No     Allergies   Metformin and related   Review of Systems Review of Systems  Allergic/Immunologic: Positive for immunocompromised state.       Diabetic hemodialysis patient  All other systems reviewed and are negative.    Physical Exam Updated Vital Signs BP 119/89 (BP Location: Right Arm)   Pulse 86   Temp 98.1 F (36.7 C) (Oral)   Resp 16   Ht 5' 7"  (1.702 m)   Wt 86 kg   SpO2 100%   BMI 29.69 kg/m   Physical Exam  Constitutional: He appears well-developed and well-nourished. No distress.  HENT:  Head: Normocephalic and atraumatic.  Eyes: Pupils are equal, round, and reactive to light. Conjunctivae are normal.  Neck: Neck supple. No tracheal deviation present. No thyromegaly present.  Cardiovascular:  Heart sounds absent  Pulmonary/Chest: Effort normal and breath sounds normal.  Dialysis catheter in place at left chest  Abdominal: Soft. Bowel sounds are normal. He exhibits no distension. There is no tenderness.  Musculoskeletal: Normal range of motion. He exhibits no edema or tenderness.  Neurological: He is alert. Coordination normal.  Skin: Skin is warm and dry. No rash noted.  Psychiatric: He has a normal mood and affect.  Nursing note and vitals reviewed.    ED Treatments / Results  Labs (all labs ordered are listed, but only abnormal results are displayed) Labs Reviewed - No data to display  EKG None  Radiology No results found.  Procedures Procedures (including critical care time)  Medications Ordered in ED Medications - No data to display  Results for orders placed or performed during the hospital encounter of 08/03/18  I-stat chem 8, ed  Result Value Ref Range   Sodium 135 135 - 145 mmol/L   Potassium 3.5 3.5 - 5.1 mmol/L   Chloride 99 98 - 111 mmol/L   BUN 32 (H) 6 - 20 mg/dL   Creatinine, Ser 5.10 (H) 0.61 -  1.24 mg/dL   Glucose, Bld 95 70 - 99 mg/dL   Calcium, Ion 1.14 (L) 1.15 - 1.40  mmol/L   TCO2 30 22 - 32 mmol/L   Hemoglobin 12.6 (L) 13.0 - 17.0 g/dL   HCT 37.0 (L) 39.0 - 52.0 %   Dg Chest Port 1 View  Result Date: 07/29/2018 CLINICAL DATA:  Chest pain, ESRD on HD, LVAD, CHF, CAD, ischemic cardiomyopathy, type II diabetes mellitus EXAM: PORTABLE CHEST 1 VIEW COMPARISON:  Portable exam 1051 hours compared to 07/27/2018 FINDINGS: LEFT subclavian AICD leads project over RIGHT atrium and RIGHT ventricle. Enlargement of cardiac silhouette post coronary arterial stenting, median sternotomy and LVAD. Mediastinal contours and pulmonary vascularity normal. Lungs clear. No pulmonary infiltrate, pleural effusion or pneumothorax. IMPRESSION: Enlargement of cardiac silhouette post LVAD and AICD. No acute abnormalities. Electronically Signed   By: Lavonia Dana M.D.   On: 07/29/2018 11:12   Dg Chest Port 1 View  Result Date: 07/27/2018 CLINICAL DATA:  Pt having SOB and CP today - hx of LVAD type system, defibrillator placed, CHF, htn, diabetes, GERD, DVT, on dialysis EXAM: PORTABLE CHEST - 1 VIEW COMPARISON:  06/11/2018 FINDINGS: Previous median sternotomy. Stable left IJ tunneled hemodialysis catheter, left subclavian AICD, and LVAD. Lungs are clear. Stable mild cardiomegaly.  Coronary stents noted. No effusion. Visualized bones unremarkable. IMPRESSION: Stable cardiomegaly and postop changes.  No acute findings. Electronically Signed   By: [name] M.D.   On: 07/27/2018 11:06   Vas US Duplex Dialysis Access (avf, Avg)  Result Date: 07/13/2018 DIALYSIS ACCESS Reason for Exam: Routine follow up. Access Site: Left Upper Extremity. Access Type: Basilic vein transposition. History: Created 05/29/2018. Limitations: Patient currently is using a LVAD which alters the waveforms and              peak systolic velocities. Performing Technologist: Delorise Shiner RVT  Examination Guidelines: A complete evaluation includes B-mode imaging, spectral Doppler, color Doppler, and power Doppler as needed of  all accessible portions of each vessel. Unilateral testing is considered an integral part of a complete examination. Limited examinations for reoccurring indications may be performed as noted.  Findings: +--------------------+----------+-----------------+----------------------------+ AVF                 PSV (cm/s)Flow Vol (mL/min)          Comments           +--------------------+----------+-----------------+----------------------------+ Native artery inflow    94           709         LVAD may interfere with                                                           calculation          +--------------------+----------+-----------------+----------------------------+ AVF Anastomosis        203                                                  +--------------------+----------+-----------------+----------------------------+  +------------+----------+-------------+----------+-----------------------------+ OUTFLOW VEINPSV (cm/s)Diameter (cm)Depth (cm)          Describe            +------------+----------+-------------+----------+-----------------------------+  Prox UA         32        0.52        1.93                                 +------------+----------+-------------+----------+-----------------------------+ Mid UA         509        0.22        1.57   change in Diameter, competing                                                   branch and stenotic      +------------+----------+-------------+----------+-----------------------------+ Dist UA        148        0.49        0.94                                 +------------+----------+-------------+----------+-----------------------------+ AC Fossa       170        0.61        0.77                                 +------------+----------+-------------+----------+-----------------------------+ Velocities in the mid upper arm segment of basilic vein increase from 131 cm/sec to 509 cm/sec.   Summary: Arteriovenous  fistula-Stenosis noted. *See table(s) above for measurements and observations.  Diagnosing physician: Servando Snare MD Electronically signed by Servando Snare MD on 07/13/2018 at 3:49:49 PM.    --------------------------------------------------------------------------------   Final    Initial Impression / Assessment and Plan / ED Course  I have reviewed the triage vital signs and the nursing notes.  Pertinent labs & imaging results that were available during my care of the patient were reviewed by me and considered in my medical decision making (see chart for details).     LVAD team called to evaluate patient Dr Sung Amabile evaluated patient in the ED and deemed that he is okay to go home.  LVAD team will follow up with him. Lab work work remarkable for end-stage renal disease Final Clinical Impressions(s) / ED Diagnoses  dx #1 vomiting #2 end-stage renal disease Final diagnoses:  None    ED Discharge Orders    None       Orlie Dakin, MD 08/03/18 1413

## 2018-08-06 ENCOUNTER — Ambulatory Visit (HOSPITAL_COMMUNITY): Payer: Self-pay | Admitting: Pharmacist

## 2018-08-06 DIAGNOSIS — Z95811 Presence of heart assist device: Secondary | ICD-10-CM

## 2018-08-06 DIAGNOSIS — Z5181 Encounter for therapeutic drug level monitoring: Secondary | ICD-10-CM

## 2018-08-06 DIAGNOSIS — I824Y9 Acute embolism and thrombosis of unspecified deep veins of unspecified proximal lower extremity: Secondary | ICD-10-CM

## 2018-08-06 LAB — POCT INR: INR: 2.8 (ref 2.0–3.0)

## 2018-08-07 ENCOUNTER — Other Ambulatory Visit (HOSPITAL_COMMUNITY): Payer: Self-pay | Admitting: *Deleted

## 2018-08-07 DIAGNOSIS — Z95811 Presence of heart assist device: Secondary | ICD-10-CM

## 2018-08-08 ENCOUNTER — Other Ambulatory Visit: Payer: Self-pay

## 2018-08-08 ENCOUNTER — Inpatient Hospital Stay (HOSPITAL_COMMUNITY)
Admission: RE | Admit: 2018-08-08 | Discharge: 2018-08-18 | DRG: 264 | Disposition: A | Discharge status: Home / Self Care | Payer: BLUE CROSS/BLUE SHIELD | Attending: Cardiology | Admitting: Cardiology

## 2018-08-08 DIAGNOSIS — N2581 Secondary hyperparathyroidism of renal origin: Secondary | ICD-10-CM | POA: Diagnosis present

## 2018-08-08 DIAGNOSIS — I2510 Atherosclerotic heart disease of native coronary artery without angina pectoris: Secondary | ICD-10-CM | POA: Diagnosis present

## 2018-08-08 DIAGNOSIS — N186 End stage renal disease: Secondary | ICD-10-CM

## 2018-08-08 DIAGNOSIS — T82838A Hemorrhage of vascular prosthetic devices, implants and grafts, initial encounter: Secondary | ICD-10-CM | POA: Diagnosis not present

## 2018-08-08 DIAGNOSIS — I5022 Chronic systolic (congestive) heart failure: Secondary | ICD-10-CM | POA: Diagnosis present

## 2018-08-08 DIAGNOSIS — R000 Tachycardia, unspecified: Secondary | ICD-10-CM | POA: Diagnosis not present

## 2018-08-08 DIAGNOSIS — Z955 Presence of coronary angioplasty implant and graft: Secondary | ICD-10-CM

## 2018-08-08 DIAGNOSIS — Z8249 Family history of ischemic heart disease and other diseases of the circulatory system: Secondary | ICD-10-CM

## 2018-08-08 DIAGNOSIS — Y832 Surgical operation with anastomosis, bypass or graft as the cause of abnormal reaction of the patient, or of later complication, without mention of misadventure at the time of the procedure: Secondary | ICD-10-CM | POA: Diagnosis present

## 2018-08-08 DIAGNOSIS — I252 Old myocardial infarction: Secondary | ICD-10-CM

## 2018-08-08 DIAGNOSIS — I132 Hypertensive heart and chronic kidney disease with heart failure and with stage 5 chronic kidney disease, or end stage renal disease: Principal | ICD-10-CM | POA: Diagnosis present

## 2018-08-08 DIAGNOSIS — Z992 Dependence on renal dialysis: Secondary | ICD-10-CM | POA: Diagnosis not present

## 2018-08-08 DIAGNOSIS — D6851 Activated protein C resistance: Secondary | ICD-10-CM | POA: Diagnosis present

## 2018-08-08 DIAGNOSIS — Z794 Long term (current) use of insulin: Secondary | ICD-10-CM

## 2018-08-08 DIAGNOSIS — Z888 Allergy status to other drugs, medicaments and biological substances status: Secondary | ICD-10-CM

## 2018-08-08 DIAGNOSIS — Z7682 Awaiting organ transplant status: Secondary | ICD-10-CM | POA: Diagnosis not present

## 2018-08-08 DIAGNOSIS — E1122 Type 2 diabetes mellitus with diabetic chronic kidney disease: Secondary | ICD-10-CM | POA: Diagnosis present

## 2018-08-08 DIAGNOSIS — E7800 Pure hypercholesterolemia, unspecified: Secondary | ICD-10-CM | POA: Diagnosis present

## 2018-08-08 DIAGNOSIS — I340 Nonrheumatic mitral (valve) insufficiency: Secondary | ICD-10-CM | POA: Diagnosis present

## 2018-08-08 DIAGNOSIS — Z95811 Presence of heart assist device: Secondary | ICD-10-CM

## 2018-08-08 DIAGNOSIS — E1151 Type 2 diabetes mellitus with diabetic peripheral angiopathy without gangrene: Secondary | ICD-10-CM | POA: Diagnosis present

## 2018-08-08 DIAGNOSIS — I4892 Unspecified atrial flutter: Secondary | ICD-10-CM | POA: Diagnosis present

## 2018-08-08 DIAGNOSIS — Z79899 Other long term (current) drug therapy: Secondary | ICD-10-CM

## 2018-08-08 DIAGNOSIS — T82858A Stenosis of vascular prosthetic devices, implants and grafts, initial encounter: Secondary | ICD-10-CM | POA: Diagnosis present

## 2018-08-08 DIAGNOSIS — J189 Pneumonia, unspecified organism: Secondary | ICD-10-CM | POA: Diagnosis not present

## 2018-08-08 DIAGNOSIS — I509 Heart failure, unspecified: Secondary | ICD-10-CM | POA: Diagnosis not present

## 2018-08-08 DIAGNOSIS — Z8371 Family history of colonic polyps: Secondary | ICD-10-CM

## 2018-08-08 DIAGNOSIS — Z86718 Personal history of other venous thrombosis and embolism: Secondary | ICD-10-CM

## 2018-08-08 DIAGNOSIS — E1143 Type 2 diabetes mellitus with diabetic autonomic (poly)neuropathy: Secondary | ICD-10-CM | POA: Diagnosis present

## 2018-08-08 DIAGNOSIS — I255 Ischemic cardiomyopathy: Secondary | ICD-10-CM | POA: Diagnosis present

## 2018-08-08 DIAGNOSIS — Z833 Family history of diabetes mellitus: Secondary | ICD-10-CM

## 2018-08-08 DIAGNOSIS — Y92230 Patient room in hospital as the place of occurrence of the external cause: Secondary | ICD-10-CM | POA: Diagnosis not present

## 2018-08-08 DIAGNOSIS — K3184 Gastroparesis: Secondary | ICD-10-CM | POA: Diagnosis present

## 2018-08-08 DIAGNOSIS — Z95810 Presence of automatic (implantable) cardiac defibrillator: Secondary | ICD-10-CM

## 2018-08-08 DIAGNOSIS — K219 Gastro-esophageal reflux disease without esophagitis: Secondary | ICD-10-CM | POA: Diagnosis present

## 2018-08-08 DIAGNOSIS — Z9049 Acquired absence of other specified parts of digestive tract: Secondary | ICD-10-CM

## 2018-08-08 DIAGNOSIS — R509 Fever, unspecified: Secondary | ICD-10-CM

## 2018-08-08 DIAGNOSIS — Y95 Nosocomial condition: Secondary | ICD-10-CM | POA: Diagnosis not present

## 2018-08-08 DIAGNOSIS — Z7982 Long term (current) use of aspirin: Secondary | ICD-10-CM

## 2018-08-08 DIAGNOSIS — E785 Hyperlipidemia, unspecified: Secondary | ICD-10-CM | POA: Diagnosis present

## 2018-08-08 DIAGNOSIS — D509 Iron deficiency anemia, unspecified: Secondary | ICD-10-CM | POA: Diagnosis present

## 2018-08-08 DIAGNOSIS — Z7901 Long term (current) use of anticoagulants: Secondary | ICD-10-CM

## 2018-08-08 DIAGNOSIS — K5090 Crohn's disease, unspecified, without complications: Secondary | ICD-10-CM | POA: Diagnosis present

## 2018-08-08 DIAGNOSIS — Z8379 Family history of other diseases of the digestive system: Secondary | ICD-10-CM

## 2018-08-08 DIAGNOSIS — Z823 Family history of stroke: Secondary | ICD-10-CM

## 2018-08-08 DIAGNOSIS — I10 Essential (primary) hypertension: Secondary | ICD-10-CM | POA: Diagnosis present

## 2018-08-08 DIAGNOSIS — Z86010 Personal history of colonic polyps: Secondary | ICD-10-CM

## 2018-08-08 LAB — COMPREHENSIVE METABOLIC PANEL
ALT: 19 U/L (ref 0–44)
AST: 18 U/L (ref 15–41)
Albumin: 3.2 g/dL — ABNORMAL LOW (ref 3.5–5.0)
Alkaline Phosphatase: 87 U/L (ref 38–126)
Anion gap: 14 (ref 5–15)
BUN: 13 mg/dL (ref 6–20)
CO2: 23 mmol/L (ref 22–32)
Calcium: 9.1 mg/dL (ref 8.9–10.3)
Chloride: 97 mmol/L — ABNORMAL LOW (ref 98–111)
Creatinine, Ser: 3 mg/dL — ABNORMAL HIGH (ref 0.61–1.24)
GFR calc Af Amer: 26 mL/min — ABNORMAL LOW (ref >60)
GFR calc non Af Amer: 23 mL/min — ABNORMAL LOW (ref >60)
GLUCOSE: 140 mg/dL — AB (ref 70–99)
POTASSIUM: 3.2 mmol/L — AB (ref 3.5–5.1)
Sodium: 134 mmol/L — ABNORMAL LOW (ref 135–145)
Total Bilirubin: 0.6 mg/dL (ref 0.3–1.2)
Total Protein: 6.8 g/dL (ref 6.5–8.1)

## 2018-08-08 LAB — CBC WITH DIFFERENTIAL/PLATELET
ABS IMMATURE GRANULOCYTES: 0.04 10*3/uL (ref 0.00–0.07)
BASOS PCT: 0 %
Basophils Absolute: 0 10*3/uL (ref 0.0–0.1)
Eosinophils Absolute: 0.2 10*3/uL (ref 0.0–0.5)
Eosinophils Relative: 3 %
HCT: 32.7 % — ABNORMAL LOW (ref 39.0–52.0)
Hemoglobin: 10.5 g/dL — ABNORMAL LOW (ref 13.0–17.0)
Immature Granulocytes: 1 %
Lymphocytes Relative: 12 %
Lymphs Abs: 1 10*3/uL (ref 0.7–4.0)
MCH: 27.9 pg (ref 26.0–34.0)
MCHC: 32.1 g/dL (ref 30.0–36.0)
MCV: 86.7 fL (ref 80.0–100.0)
Monocytes Absolute: 0.6 10*3/uL (ref 0.1–1.0)
Monocytes Relative: 7 %
Neutro Abs: 6.4 10*3/uL (ref 1.7–7.7)
Neutrophils Relative %: 77 %
Platelets: UNDETERMINED 10*3/uL (ref 150–400)
RBC: 3.77 MIL/uL — ABNORMAL LOW (ref 4.22–5.81)
RDW: 14.7 % (ref 11.5–15.5)
WBC: 8.2 10*3/uL (ref 4.0–10.5)
nRBC: 0 % (ref 0.0–0.2)

## 2018-08-08 LAB — GLUCOSE, CAPILLARY
Glucose-Capillary: 61 mg/dL — ABNORMAL LOW (ref 70–99)
Glucose-Capillary: 106 mg/dL — ABNORMAL HIGH (ref 70–99)
Glucose-Capillary: 67 mg/dL — ABNORMAL LOW (ref 70–99)
Glucose-Capillary: 95 mg/dL (ref 70–99)
Glucose-Capillary: 112 mg/dL — ABNORMAL HIGH (ref 70–99)

## 2018-08-08 LAB — LACTATE DEHYDROGENASE: LDH: 238 U/L — ABNORMAL HIGH (ref 98–192)

## 2018-08-08 LAB — PROTIME-INR
INR: 1.39
PROTHROMBIN TIME: 16.9 s — AB (ref 11.4–15.2)

## 2018-08-08 LAB — MAGNESIUM: Magnesium: 1.6 mg/dL — ABNORMAL LOW (ref 1.7–2.4)

## 2018-08-08 MED ORDER — HYDRALAZINE HCL 20 MG/ML IJ SOLN
10.0000 mg | INTRAMUSCULAR | Status: DC | PRN
  Administered 2018-08-08: 10 mg via INTRAVENOUS
  Filled 2018-08-08 (×2): qty 1

## 2018-08-08 MED ORDER — LORAZEPAM 0.5 MG PO TABS
0.5000 mg | ORAL_TABLET | Freq: Two times a day (BID) | ORAL | Status: DC | PRN
  Filled 2018-08-08: qty 1

## 2018-08-08 MED ORDER — METOCLOPRAMIDE HCL 10 MG PO TABS
10.0000 mg | ORAL_TABLET | Freq: Three times a day (TID) | ORAL | Status: DC
  Administered 2018-08-08 – 2018-08-18 (×29): 10 mg via ORAL
  Filled 2018-08-08 (×29): qty 1

## 2018-08-08 MED ORDER — DEXTROSE 50 % IV SOLN
12.5000 g | INTRAVENOUS | Status: AC
  Administered 2018-08-08: 12.5 g via INTRAVENOUS

## 2018-08-08 MED ORDER — HEPARIN (PORCINE) 25000 UT/250ML-% IV SOLN
1300.0000 [IU]/h | INTRAVENOUS | Status: DC
  Administered 2018-08-08: 1050 [IU]/h via INTRAVENOUS
  Filled 2018-08-08 (×2): qty 250

## 2018-08-08 MED ORDER — ONDANSETRON HCL 4 MG/2ML IJ SOLN
4.0000 mg | Freq: Four times a day (QID) | INTRAMUSCULAR | Status: DC | PRN
  Administered 2018-08-08: 4 mg via INTRAVENOUS
  Filled 2018-08-08: qty 2

## 2018-08-08 MED ORDER — SODIUM CHLORIDE 0.9 % IV SOLN
INTRAVENOUS | Status: DC
  Administered 2018-08-09: 06:00:00 via INTRAVENOUS

## 2018-08-08 MED ORDER — SEVELAMER CARBONATE 800 MG PO TABS: 1600.0000 mg | ORAL_TABLET | ORAL | Status: DC

## 2018-08-08 MED ORDER — RENA-VITE PO TABS
1.0000 | ORAL_TABLET | Freq: Every day | ORAL | Status: DC
  Administered 2018-08-13 – 2018-08-18 (×4): 1 via ORAL
  Filled 2018-08-08 (×9): qty 1

## 2018-08-08 MED ORDER — ERYTHROMYCIN BASE 250 MG PO TABS
250.0000 mg | ORAL_TABLET | Freq: Three times a day (TID) | ORAL | Status: DC
  Administered 2018-08-08 – 2018-08-18 (×29): 250 mg via ORAL
  Filled 2018-08-08 (×29): qty 1

## 2018-08-08 MED ORDER — PROMETHAZINE HCL 25 MG/ML IJ SOLN
12.5000 mg | Freq: Four times a day (QID) | INTRAMUSCULAR | Status: DC | PRN
  Administered 2018-08-08 – 2018-08-18 (×3): 12.5 mg via INTRAVENOUS
  Filled 2018-08-08 (×3): qty 1

## 2018-08-08 MED ORDER — PANTOPRAZOLE SODIUM 40 MG PO TBEC
40.0000 mg | DELAYED_RELEASE_TABLET | Freq: Two times a day (BID) | ORAL | Status: DC
  Administered 2018-08-08 – 2018-08-18 (×19): 40 mg via ORAL
  Filled 2018-08-08 (×19): qty 1

## 2018-08-08 MED ORDER — SODIUM CHLORIDE 0.9 % IV BOLUS
250.0000 mL | Freq: Once | INTRAVENOUS | Status: AC
  Administered 2018-08-08: 250 mL via INTRAVENOUS

## 2018-08-08 MED ORDER — SEVELAMER CARBONATE 800 MG PO TABS
1600.0000 mg | ORAL_TABLET | ORAL | Status: DC
  Administered 2018-08-09 – 2018-08-13 (×2): 1600 mg via ORAL
  Filled 2018-08-08 (×3): qty 2

## 2018-08-08 MED ORDER — SODIUM CHLORIDE 0.9 % IV SOLN: 250.0000 mL | INTRAVENOUS | Status: DC | PRN

## 2018-08-08 MED ORDER — CEFAZOLIN SODIUM-DEXTROSE 2-4 GM/100ML-% IV SOLN
2.0000 g | INTRAVENOUS | Status: AC
  Administered 2018-08-09: 2 g via INTRAVENOUS
  Filled 2018-08-08 (×2): qty 100

## 2018-08-08 MED ORDER — CITALOPRAM HYDROBROMIDE 20 MG PO TABS
20.0000 mg | ORAL_TABLET | Freq: Every day | ORAL | Status: DC
  Administered 2018-08-10 – 2018-08-18 (×9): 20 mg via ORAL
  Filled 2018-08-08 (×9): qty 1

## 2018-08-08 MED ORDER — AMIODARONE HCL 200 MG PO TABS
200.0000 mg | ORAL_TABLET | Freq: Every day | ORAL | Status: DC
  Administered 2018-08-10 – 2018-08-18 (×9): 200 mg via ORAL
  Filled 2018-08-08 (×9): qty 1

## 2018-08-08 MED ORDER — SEVELAMER CARBONATE 800 MG PO TABS
4000.0000 mg | ORAL_TABLET | Freq: Three times a day (TID) | ORAL | Status: DC
  Administered 2018-08-09 – 2018-08-16 (×5): 4000 mg via ORAL
  Filled 2018-08-08 (×13): qty 5

## 2018-08-08 MED ORDER — INSULIN GLARGINE 100 UNIT/ML ~~LOC~~ SOLN
8.0000 [IU] | Freq: Every day | SUBCUTANEOUS | Status: DC
  Administered 2018-08-10 – 2018-08-18 (×7): 8 [IU] via SUBCUTANEOUS
  Filled 2018-08-08 (×11): qty 0.08

## 2018-08-08 MED ORDER — INSULIN ASPART 100 UNIT/ML ~~LOC~~ SOLN
3.0000 [IU] | Freq: Three times a day (TID) | SUBCUTANEOUS | Status: DC
  Administered 2018-08-11 – 2018-08-18 (×9): 3 [IU] via SUBCUTANEOUS

## 2018-08-08 MED ORDER — ATORVASTATIN CALCIUM 40 MG PO TABS
40.0000 mg | ORAL_TABLET | Freq: Every day | ORAL | Status: DC
  Administered 2018-08-08 – 2018-08-17 (×10): 40 mg via ORAL
  Filled 2018-08-08 (×10): qty 1

## 2018-08-08 MED ORDER — CHLORHEXIDINE GLUCONATE 4 % EX LIQD
60.0000 mL | Freq: Once | CUTANEOUS | Status: DC
  Filled 2018-08-08: qty 30

## 2018-08-08 MED ORDER — SODIUM CHLORIDE 0.9 % IV SOLN: INTRAVENOUS | Status: DC

## 2018-08-08 MED ORDER — LORAZEPAM 2 MG/ML IJ SOLN
0.5000 mg | Freq: Two times a day (BID) | INTRAMUSCULAR | Status: DC | PRN
  Administered 2018-08-08: 0.5 mg via INTRAVENOUS
  Filled 2018-08-08: qty 1

## 2018-08-08 MED ORDER — PROMETHAZINE HCL 25 MG PO TABS
12.5000 mg | ORAL_TABLET | Freq: Four times a day (QID) | ORAL | Status: DC | PRN
  Filled 2018-08-08: qty 1

## 2018-08-08 MED ORDER — ASPIRIN EC 81 MG PO TBEC
81.0000 mg | DELAYED_RELEASE_TABLET | Freq: Every day | ORAL | Status: DC
  Administered 2018-08-10 – 2018-08-12 (×3): 81 mg via ORAL
  Filled 2018-08-08 (×3): qty 1

## 2018-08-08 MED ORDER — LIDOCAINE-PRILOCAINE 2.5-2.5 % EX CREA: 1.0000 "application " | TOPICAL_CREAM | CUTANEOUS | Status: DC | PRN

## 2018-08-08 MED ORDER — CALCITRIOL 0.25 MCG PO CAPS
0.5000 ug | ORAL_CAPSULE | ORAL | Status: DC
  Administered 2018-08-10 – 2018-08-15 (×4): 0.5 ug via ORAL
  Filled 2018-08-08 (×5): qty 2

## 2018-08-08 MED ORDER — POTASSIUM CHLORIDE CRYS ER 20 MEQ PO TBCR
20.0000 meq | EXTENDED_RELEASE_TABLET | Freq: Once | ORAL | Status: AC
  Administered 2018-08-08: 20 meq via ORAL
  Filled 2018-08-08: qty 1

## 2018-08-08 MED ORDER — DEXTROSE 50 % IV SOLN
INTRAVENOUS | Status: AC
  Filled 2018-08-08: qty 50

## 2018-08-08 MED ORDER — ACETAMINOPHEN 325 MG PO TABS: 650.0000 mg | ORAL_TABLET | ORAL | Status: DC | PRN

## 2018-08-08 MED ORDER — DOCUSATE SODIUM 100 MG PO CAPS
100.0000 mg | ORAL_CAPSULE | Freq: Every day | ORAL | Status: DC | PRN
  Administered 2018-08-14: 100 mg via ORAL
  Filled 2018-08-08: qty 1

## 2018-08-08 MED ORDER — DRONABINOL 2.5 MG PO CAPS
2.5000 mg | ORAL_CAPSULE | Freq: Two times a day (BID) | ORAL | Status: DC
  Administered 2018-08-08 – 2018-08-17 (×16): 2.5 mg via ORAL
  Filled 2018-08-08 (×17): qty 1

## 2018-08-08 MED ORDER — CHLORHEXIDINE GLUCONATE 4 % EX LIQD
60.0000 mL | Freq: Once | CUTANEOUS | Status: AC
  Administered 2018-08-09: 4 via TOPICAL

## 2018-08-08 MED ORDER — INSULIN ASPART 100 UNIT/ML ~~LOC~~ SOLN
0.0000 [IU] | Freq: Three times a day (TID) | SUBCUTANEOUS | Status: DC
  Administered 2018-08-11: 1 [IU] via SUBCUTANEOUS
  Administered 2018-08-12: 2 [IU] via SUBCUTANEOUS
  Administered 2018-08-13 – 2018-08-14 (×2): 1 [IU] via SUBCUTANEOUS
  Administered 2018-08-14: 2 [IU] via SUBCUTANEOUS
  Administered 2018-08-14 – 2018-08-15 (×2): 3 [IU] via SUBCUTANEOUS
  Administered 2018-08-15: 5 [IU] via SUBCUTANEOUS
  Administered 2018-08-16: 3 [IU] via SUBCUTANEOUS
  Administered 2018-08-16 (×2): 2 [IU] via SUBCUTANEOUS
  Administered 2018-08-17: 1 [IU] via SUBCUTANEOUS
  Administered 2018-08-17 (×2): 2 [IU] via SUBCUTANEOUS
  Administered 2018-08-18: 1 [IU] via SUBCUTANEOUS

## 2018-08-08 MED ORDER — MAGNESIUM SULFATE 4 GM/100ML IV SOLN
4.0000 g | Freq: Once | INTRAVENOUS | Status: AC
  Administered 2018-08-08: 4 g via INTRAVENOUS
  Filled 2018-08-08: qty 100

## 2018-08-08 MED ORDER — SODIUM CHLORIDE 0.9% FLUSH: 3.0000 mL | INTRAVENOUS | Status: DC | PRN

## 2018-08-08 MED ORDER — INSULIN ASPART 100 UNIT/ML ~~LOC~~ SOLN: 0.0000 [IU] | Freq: Every day | SUBCUTANEOUS | Status: DC

## 2018-08-08 MED ORDER — ISOSORB DINITRATE-HYDRALAZINE 20-37.5 MG PO TABS
1.0000 | ORAL_TABLET | ORAL | Status: DC
  Administered 2018-08-09 – 2018-08-18 (×13): 1 via ORAL
  Filled 2018-08-08 (×13): qty 1

## 2018-08-08 MED ORDER — SODIUM CHLORIDE 0.9% FLUSH
3.0000 mL | Freq: Two times a day (BID) | INTRAVENOUS | Status: DC
  Administered 2018-08-08: 3 mL via INTRAVENOUS

## 2018-08-08 NOTE — Consult Note (Signed)
[facility] Renal Consultation Note    Indication for Consultation:  Management of ESRD/hemodialysis, anemia, hypertension/volume, and secondary hyperparathyroidism. PCP:  HPI: [name] is a 52 y.o. male with ESRD, CAD, severe ICM with LVAD , Type 2 DM with severe gastroparesis, HTN, GERD who was admitted to the HF service for heparin bridging in advance of scheduled stage 2 BVT surgery on 08/09/18.  Upon getting here, developed some weakness and vomiting. Had not eaten much today. Had his dialysis earlier this morning with 3L removed, he feels that his EDW is too low. His MD ordered 256m bolus which he is getting now. Denies CP, dyspnea, abd pain currently. No fever or chills.  [facility] Very compliant with his dialysis treatments, using TDC currently. Reviewing records, his recent dialysis weights are as below, has not been reaching EDW (80kg) since Thanksgiving:  11/24: Pre 79.2, post 79.8 11/26: Pre 83.4kg, post 80 11/29: Pre 86.2, post 84.2 12/2: Pre 87.2kg, post 84.4 12/4: Pre 85.7, post 83.3   Past Medical History:  Diagnosis Date  . Angina   . ASCVD (arteriosclerotic cardiovascular disease)    , Anterior infarction 2005, LAD diagonal bifurcation intervention 03/2004  . Automatic implantable cardiac defibrillator -St. Jude's       . Benign neoplasm of colon   . CHF (congestive heart failure) ([place]   . Chronic systolic heart failure ([place]   . Coronary artery disease     Widely patent previously placed stents in the left anterior   . Crohn's disease ([place]   . 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 Dialysis patient ([place]   . Dyspnea   . Gastroparesis   . GERD (gastroesophageal reflux disease)   . High cholesterol   . Hyperlipidemia   . Hypersomnolent    Previous diagnosis of narcolepsy  . Hypertension, essential   . Ischemic cardiomyopathy    Ejection fraction 15-20% catheterization 2010  . Type II or  unspecified type diabetes mellitus without mention of complication, not stated as uncontrolled   . Unspecified gastritis and gastroduodenitis without mention of hemorrhage    Past Surgical History:  Procedure Laterality Date  . A/V SHUNTOGRAM N/A 05/24/2018   Procedure: A/V SHUNTOGRAM;  Surgeon: CMarty Heck MD;  [place];  Service: Cardiovascular;  Laterality: N/A;  . APPENDECTOMY    . AV FISTULA PLACEMENT Right 06/26/2017   Procedure: ARTERIOVENOUS (AV) FISTULA CREATION VERSUS GRAFT RIGHT  ARM;  Surgeon: CConrad Marmarth MD;  [place]  Service: Vascular;  Laterality: Right;  . AV FISTULA PLACEMENT Right 10/31/2017   Procedure: INSERTION OF ARTERIOVENOUS (AV) ARTEGRAFT,  RIGHT UPPER ARM;  Surgeon: CConrad Tijeras MD;  [place]  Service: Vascular;  Laterality: Right;  . AV FISTULA PLACEMENT Left 05/29/2018   Procedure: ARTERIOVENOUS (AV) FISTULA CREATION  LEFT UPPER EXTREMITY;  Surgeon: CWaynetta Sandy MD;  [place]  Service: Vascular;  Laterality: Left;  . CARDIAC DEFIBRILLATOR PLACEMENT  2010   St. Jude ICD  . CENTRAL LINE INSERTION Right 05/24/2018   Procedure: CENTRAL LINE INSERTION;  Surgeon: CMarty Heck MD;  [place];  Service: Cardiovascular;  Laterality: Right;  . COLECTOMY  ~ 2003   "for Crohn's" ascending colon.    . CORONARY STENT INTERVENTION N/A 11/25/2016   Procedure: Coronary Stent Intervention;  Surgeon: DLeonie Man MD;  [place];  Service: Cardiovascular;  Laterality: N/A;  . EP IMPLANTABLE DEVICE N/A 09/14/2016   Procedure: ICD Generator Changeout;  Surgeon: Deboraha Sprang, MD;  Location: [name] CV LAB;  Service: Cardiovascular;  Laterality: N/A;  . ESOPHAGOGASTRODUODENOSCOPY N/A 07/12/2017   Procedure: ESOPHAGOGASTRODUODENOSCOPY (EGD);  Surgeon: Jerene Bears, MD;  Location[facility];  Service: Gastroenterology;  Laterality: N/A;  VAD pt so VAD team will  need to accompany pt.   [name] FETAL SURGERY FOR CONGENITAL HERNIA     ????? pt has no knowledge of this..    . INGUINAL HERNIA REPAIR    . INSERTION OF DIALYSIS CATHETER Right 06/26/2017   Procedure: INSERTION OF TUNNELED  DIALYSIS CATHETER RIGHT INTERNAL JUGULAR;  Surgeon: Conrad [place]es [place], MD;  Location: [place];  Service: Vascular;  Laterality: Right;  . INSERTION OF IMPLANTABLE LEFT VENTRICULAR ASSIST DEVICE N/A 06/12/2017   Procedure: INSERTION OF IMPLANTABLE LEFT VENTRICULAR ASSIST [place] 3;  Surgeon: Ivin Poot, MD;  Location: [place];  Service: Open Heart Surgery;  Laterality: N/A;  HM3 LVAD  CIRC ARREST  NITRIC OXIDE  . INSERTION OF IMPLANTABLE LEFT VENTRICULAR ASSIST DEVICE N/A 02/28/2018   Procedure: REDO INSERTION OF IMPLANTABLE LEFT VENTRICULAR ASSIST DEVICE - HEARTMATE 3;  Surgeon: Ivin Poot, MD;  Location: [place];  Service: Open Heart Surgery;  Laterality: N/A;  . IR REMOVAL TUN CV CATH W/O FL  02/26/2018  . IR THORACENTESIS ASP PLEURAL SPACE W/IMG GUIDE  06/28/2017  . LIGATION OF ARTERIOVENOUS  FISTULA Right 10/31/2017   Procedure: LIGATION OF BRACHIO-BASILIC VEIN TRANSPOSITION RIGHT ARM;  Surgeon: Conrad Etowah, MD;  Location: [place];  Service: Vascular;  Laterality: Right;  . MULTIPLE EXTRACTIONS WITH ALVEOLOPLASTY N/A 06/08/2017   Procedure: MULTIPLE EXTRACTION WITH ALVEOLOPLASTY AND PRE PROSTHETIC SURGERY AS NEEDED;  Surgeon: Lenn Cal, DDS;  Location: [place];  Service: Oral Surgery;  Laterality: N/A;  . RIGHT HEART CATH N/A 06/07/2017   Procedure: RIGHT HEART CATH;  Surgeon: Larey Dresser, MD;  Location: [place] CV LAB;  Service: Cardiovascular;  Laterality: N/A;  . RIGHT HEART CATH N/A 02/08/2018   Procedure: RIGHT HEART CATH;  Surgeon: Larey Dresser, MD;  Location: [place] CV LAB;  Service: Cardiovascular;  Laterality: N/A;  . RIGHT/LEFT HEART CATH AND CORONARY ANGIOGRAPHY N/A 11/23/2016   Procedure: Right/Left Heart Cath and Coronary  Angiography;  Surgeon: Larey Dresser, MD;  Location: [place] CV LAB;  Service: Cardiovascular;  Laterality: N/A;  . TEE WITHOUT CARDIOVERSION N/A 06/12/2017   Procedure: TRANSESOPHAGEAL ECHOCARDIOGRAM (TEE);  Surgeon: Prescott Gum, Collier Salina, MD;  Location: [place];  Service: Open Heart Surgery;  Laterality: N/A;  . TEE WITHOUT CARDIOVERSION N/A 02/28/2018   Procedure: TRANSESOPHAGEAL ECHOCARDIOGRAM (TEE);  Surgeon: Prescott Gum, Collier Salina, MD;  Location: [place];  Service: Open Heart Surgery;  Laterality: N/A;  . TEMPORARY DIALYSIS CATHETER Left 05/24/2018   Procedure: TEMPORARY DIALYSIS CATHETER;  Surgeon: Marty Heck, MD;  Location: [place] CV LAB;  Service: Cardiovascular;  Laterality: Left;  . THROMBECTOMY AND REVISION OF ARTERIOVENTOUS (AV) GORETEX  GRAFT Right 12/12/2017   Procedure: THROMBECTOMY ARTERIOVENTOUS (AV) GORETEX  GRAFT RIGHT UPPER ARM;  Surgeon: Conrad Duquesne, MD;  Location: [facility] OR;  Service: Vascular;  Laterality: Right;   Family History  Problem Relation Age of Onset  . Colon polyps Mother   . Diabetes Mother   . Heart attack Father   . Diabetes Father   . Stroke Paternal Grandfather   . Colitis Sister   .  Heart disease Brother   . Colitis Sister   . Colon cancer Neg Hx    Social History:  reports that he has never smoked. He has never used smokeless tobacco. He reports that he does not drink alcohol or use drugs.  ROS: As per HPI otherwise negative.  Physical Exam: Vitals:   08/08/18 1208 08/08/18 1331  BP: 115/80 115/80  Pulse:  79  Resp:  19  Temp: 98.6 F (37 C) 98.6 F (37 C)  TempSrc: Oral Oral  Weight: 80.5 kg 80.5 kg  Height: _0  (1.702 m) _1  (1.702 m)     General: Well developed man, lying in bed with wet washcloth over forehead after vomiting episode Head: Normocephalic, atraumatic, sclera non-icteric, mucus membranes are moist. Neck: Supple without lymphadenopathy/masses. JVD not elevated. Lungs: Clear bilaterally to auscultation without  wheezes, rales, or rhonchi.  Heart: Continuous LVAD hum Abdomen: Soft, non-tender, non-distended with normoactive bowel sounds.  Musculoskeletal:  Strength and tone appear normal for age. Lower extremities: No edema or ischemic changes, no open wounds. Neuro: Alert and oriented X 3. Moves all extremities spontaneously. Psych:  Responds to questions appropriately with a normal affect. Dialysis Access: TDC, 1st stage BVT + bruit  Allergies  Allergen Reactions  . Metformin And Related Diarrhea   Prior to Admission medications   Medication Sig Start Date End Date Taking? Authorizing Provider  amiodarone (PACERONE) 200 MG tablet Take 200 mg by mouth daily.   Yes [provider]  aspirin EC 81 MG tablet Take 1 tablet (81 mg total) by mouth daily. 07/26/17  Yes Larey Dresser, MD  atorvastatin (LIPITOR) 40 MG tablet Take 1 tablet (40 mg total) by mouth daily at 6 PM. 01/25/18  Yes [name], MD  calcitRIOL (ROCALTROL) 0.5 MCG capsule Take 1 capsule (0.5 mcg total) by mouth every Monday, Wednesday, and Friday. 06/06/18  Yes Georgiana Shore, NP  citalopram (CELEXA) 20 MG tablet Take 1 tablet (20 mg total) by mouth daily. 03/19/18  Yes Georgiana Shore, NP  docusate sodium (COLACE) 100 MG capsule Take 100 mg by mouth daily as needed for mild constipation.   Yes [provider]  dronabinol (MARINOL) 2.5 MG capsule Take 1 capsule (2.5 mg total) by mouth 2 (two) times daily before lunch and supper. 03/27/18  Yes Larey Dresser, MD  erythromycin (E-MYCIN) 250 MG tablet Take 1 tablet (250 mg total) by mouth 3 (three) times daily. 03/25/18  Yes Isaiah Serge, NP  insulin aspart (NOVOLOG) 100 UNIT/ML injection Inject 5 Units into the skin 3 (three) times daily with meals. 01/22/18  Yes Georgiana Shore, NP  insulin glargine (LANTUS) 100 UNIT/ML injection Inject 0.1 mLs (10 Units total) into the skin daily. 05/09/18  Yes Lovenia Kim, MD  isosorbide-hydrALAZINE (BIDIL) 20-37.5 MG  tablet Take 1.5 tablets by mouth every 8 (eight) hours. Take 1.5 tablets every 8 hours on non dialysis days. Patient taking differently: Take 1 tablet by mouth daily.  03/19/18  Yes Georgiana Shore, NP  lidocaine-prilocaine (EMLA) cream Apply 1 application topically as needed (to numb port).   Yes [provider]  LORazepam (ATIVAN) 0.5 MG tablet Take 1 tablet (0.5 mg total) by mouth every 12 (twelve) hours as needed (nausea). 03/27/18  Yes Larey Dresser, MD  metoCLOPramide (REGLAN) 10 MG tablet Take 1 tablet (10 mg total) by mouth 3 (three) times daily. 01/22/18  Yes Georgiana Shore, NP  multivitamin (RENA-VIT) TABS tablet Take 1  tablet by mouth daily.   Yes [provider]  pantoprazole (PROTONIX) 40 MG tablet Take 40 mg by mouth 2 (two) times daily.    Yes [provider]  promethazine (PHENERGAN) 12.5 MG tablet Take 1 tablet (12.5 mg total) by mouth every 6 (six) hours as needed for nausea or vomiting. 07/06/18  Yes Larey Dresser, MD  sevelamer carbonate (RENVELA) 800 MG tablet Take 1,600-4,000 mg by mouth See admin instructions. 4079m three times daily with meals and 16051mtwice daily with snacks   Yes [provider]  warfarin (COUMADIN) 5 MG tablet Take 5-7.5 mg by mouth See admin instructions. Take 5 mg (1 tablet) daily except 7.5 mg (1 and 1/2 tablets) on Tuesdays and Fridays   Yes [provider]  enoxaparin (LOVENOX) 80 MG/0.8ML injection Inject 0.75 mLs (75 mg total) into the skin every 12 (twelve) hours. Patient not taking: Reported on 08/03/2018 07/06/18   McLarey DresserMD   Current Facility-Administered Medications  Medication Dose Route Frequency Provider Last Rate Last Dose  . 0.9 %  sodium chloride infusion   Intravenous Continuous SmGeorgiana ShoreNP      . acetaminophen (TYLENOL) tablet 650 mg  650 mg Oral Q4H PRN SmGeorgiana ShoreNP      . amiodarone (PACERONE) tablet 200 mg  200 mg Oral Daily SmGeorgiana ShoreNP      .  aspirin EC tablet 81 mg  81 mg Oral Daily SmGeorgiana ShoreNP      . atorvastatin (LIPITOR) tablet 40 mg  40 mg Oral q1800 SmGeorgiana ShoreNP      . calcitRIOL (ROCALTROL) capsule 0.5 mcg  0.5 mcg Oral Q M,W,F SmGeorgiana ShoreNP      . [SDerrill MemoN 08/09/2018] ceFAZolin (ANCEF) IVPB 2g/100 mL premix  2 g Intravenous 30 min Pre-Op SmGeorgiana ShoreNP      . chlorhexidine (HIBICLENS) 4 % liquid 4 application  60 mL Topical Once SmGeorgiana ShoreNP       And  . [START ON 08/09/2018] chlorhexidine (HIBICLENS) 4 % liquid 4 application  60 mL Topical Once SmGeorgiana ShoreNP      . citalopram (CELEXA) tablet 20 mg  20 mg Oral Daily SmGeorgiana ShoreNP      . docusate sodium (COLACE) capsule 100 mg  100 mg Oral Daily PRN SmGeorgiana ShoreNP      . dronabinol (MARINOL) capsule 2.5 mg  2.5 mg Oral BID AC SmGeorgiana ShoreNP      . erythromycin (E-MYCIN) tablet 250 mg  250 mg Oral TID SmGeorgiana ShoreNP      . heparin ADULT infusion 100 units/mL (25000 units/25052modium chloride 0.45%)  1,050 Units/hr Intravenous Continuous McLLarey DresserD 10.5 mL/hr at 08/08/18 1432 1,050 Units/hr at 08/08/18 1432  . insulin aspart (novoLOG) injection 0-5 Units  0-5 Units Subcutaneous QHS SmiGeorgiana ShoreP      . insulin aspart (novoLOG) injection 0-9 Units  0-9 Units Subcutaneous TID WC SmiGeorgiana ShoreP      . insulin aspart (novoLOG) injection 3 Units  3 Units Subcutaneous TID WC SmiGeorgiana ShoreP      . insulin glargine (LANTUS) injection 8 Units  8 Units Subcutaneous Daily SmiGeorgiana ShoreP      . [STDerrill Memo 08/09/2018] isosorbide-hydrALAZINE (BIDIL) 20-37.5 MG per tablet 1 tablet  1 tablet Oral 3 times per day  on Sun Tue Thu Sat Georgiana Shore, NP      . lidocaine-prilocaine (EMLA) cream 1 application  1 application Topical PRN Georgiana Shore, NP      . LORazepam (ATIVAN) injection 0.5 mg  0.5 mg Intravenous Q12H PRN Georgiana Shore, NP   0.5 mg at 08/08/18 1429  . magnesium sulfate IVPB 4 g 100 mL  4 g  Intravenous Once Georgiana Shore, NP      . metoCLOPramide (REGLAN) tablet 10 mg  10 mg Oral TID Georgiana Shore, NP      . multivitamin (RENA-VIT) tablet 1 tablet  1 tablet Oral Daily Georgiana Shore, NP      . ondansetron [facility]) injection 4 mg  4 mg Intravenous Q6H PRN Georgiana Shore, NP   4 mg at 08/08/18 1343  . pantoprazole (PROTONIX) EC tablet 40 mg  40 mg Oral BID Georgiana Shore, NP      . potassium chloride SA (K-DUR,KLOR-CON) CR tablet 20 mEq  20 mEq Oral Once Georgiana Shore, NP      . promethazine (PHENERGAN) injection 12.5 mg  12.5 mg Intravenous Q6H PRN Georgiana Shore, NP   12.5 mg at 08/08/18 1427  . sevelamer carbonate (RENVELA) tablet 1,600 mg  1,600 mg Oral 2 times per day Larey Dresser, MD      . sevelamer carbonate (RENVELA) tablet 4,000 mg  4,000 mg Oral TID WC Larey Dresser, MD      . sodium chloride 0.9 % bolus 250 mL  250 mL Intravenous Once Georgiana Shore, NP 483.9 mL/hr at 08/08/18 1442 250 mL at 08/08/18 1442   Labs: Basic Metabolic Panel: Recent Labs  Lab 08/03/18 1216 08/08/18 1248  NA 135 134*  K 3.5 3.2*  CL 99 97*  CO2  --  23  GLUCOSE 95 140*  BUN 32* 13  CREATININE 5.10* 3.00*  CALCIUM  --  9.1   Liver Function Tests: Recent Labs  Lab 08/08/18 1248  AST 18  ALT 19  ALKPHOS 87  BILITOT 0.6  PROT 6.8  ALBUMIN 3.2*   CBC: Recent Labs  Lab 08/03/18 1216 08/08/18 1248  WBC  --  8.2  NEUTROABS  --  6.4  HGB 12.6* 10.5*  HCT 37.0* 32.7*  MCV  --  86.7  PLT  --  PLATELET CLUMPS NOTED ON SMEAR, UNABLE TO ESTIMATE   Dialysis Orders:  MWF at [facility] 4:15, 400/800, EDW 80kg, 2K/3Ca bath, No heparin, TDC - Calcitriol 31mg PO q HD - No ESA recently, Hgb > 11  Assessment/Plan: 1.  Dialysis access: For scheduled 2nd stage BVT 12/5 (tomorrow). Per [facility]. 2.  ESRD: Finished HD today, not reaching dry weight over the past week. Pt feels that he has gained weight, while his outpatient nephrologist is worried that he is retaining fluid since  he was able to get to 80kg (EDW) just last week. Vomiting today (recurrent gastroparesis issues), will reassess with standing weight tomorrow and consider short HD to get back to prior EDW (?). 3.  Hypertension/volume: BP controlled, no edema on exam -- see above. May have gained some true body weight, but met the prior EDW just last week, so questionable. 4.  Anemia: Hgb 10.5 - will plan to resume ESA with next HD. 5.  Metabolic bone disease: Ca ok, Phos pending. Continue home binders. 6.  T2DM: Per primary 7.  CAD/severe ICM with Heartmate 3 LVAD: Per HF team.  KJoellen Jersey  Basilia Jumbo 08/08/2018, 3:09 PM  [facility] Pager: [phone]

## 2018-08-08 NOTE — Progress Notes (Signed)
ANTICOAGULATION CONSULT NOTE - Initial Consult  Pharmacy Consult for heparin Indication: LVAD/Factor 5   Allergies  Allergen Reactions  . Metformin And Related Diarrhea    Patient Measurements: Height: 5' 7"  (170.2 cm) Weight: 177 lb 7.5 oz (80.5 kg) IBW/kg (Calculated) : 66.1 Heparin Dosing Weight: 80.5 kg  Vital Signs: Temp: 98.6 F (37 C) (12/04 1331) Temp Source: Oral (12/04 1331) BP: 115/80 (12/04 1331) Pulse Rate: 79 (12/04 1331)  Labs: Recent Labs    08/06/18 08/08/18 1240 08/08/18 1248  LABPROT  --  16.9*  --   INR 2.8 1.39  --   CREATININE  --   --  3.00*    Estimated Creatinine Clearance: 29 mL/min (A) (by C-G formula based on SCr of 3 mg/dL (H)).   Medical History: Past Medical History:  Diagnosis Date  . Angina   . ASCVD (arteriosclerotic cardiovascular disease)    , Anterior infarction 2005, LAD diagonal bifurcation intervention 03/2004  . Automatic implantable cardiac defibrillator -St. Jude's       . Benign neoplasm of colon   . CHF (congestive heart failure) ([place])   . Chronic systolic heart failure ([place])   . Coronary artery disease     Widely patent previously placed stents in the left anterior   . Crohn's disease ([place])   . 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 Dialysis patient ([place])   . Dyspnea   . Gastroparesis   . GERD (gastroesophageal reflux disease)   . High cholesterol   . Hyperlipidemia   . Hypersomnolent    Previous diagnosis of narcolepsy  . Hypertension, essential   . Ischemic cardiomyopathy    Ejection fraction 15-20% catheterization 2010  . Type II or unspecified type diabetes mellitus without mention of complication, not stated as uncontrolled   . Unspecified gastritis and gastroduodenitis without mention of hemorrhage     Medications:  Scheduled:  . amiodarone  200 mg Oral Daily  . aspirin EC  81 mg Oral Daily  . atorvastatin  40 mg Oral q1800  . calcitRIOL  0.5 mcg Oral Q M,W,F  . chlorhexidine   60 mL Topical Once   And  . [START ON 08/09/2018] chlorhexidine  60 mL Topical Once  . citalopram  20 mg Oral Daily  . dronabinol  2.5 mg Oral BID AC  . erythromycin  250 mg Oral TID  . insulin aspart  0-5 Units Subcutaneous QHS  . insulin aspart  0-9 Units Subcutaneous TID WC  . insulin aspart  3 Units Subcutaneous TID WC  . insulin glargine  8 Units Subcutaneous Daily  . [START ON 08/09/2018] isosorbide-hydrALAZINE  1 tablet Oral TID  . metoCLOPramide  10 mg Oral TID  . multivitamin  1 tablet Oral Daily  . pantoprazole  40 mg Oral BID  . potassium chloride  20 mEq Oral Once  . sevelamer carbonate  1,600-4,000 mg Oral See admin instructions    Assessment: 32 yom with hx of Heartmate 3 LVAD and ESRD, plan for LUE AVF placement on 12/5. On warfarin PTA - last dose taken on 12/1.   INR 1.39 today - plan for heparin infusion once INR<1.8. No s/sx of bleeding. Hgb 10.5, plt clumped on CBC. LDH 238.   Goal of Therapy:  Heparin level 0.3-0.7 units/ml Monitor platelets by anticoagulation protocol: Yes   Plan:  Start heparin infusion at 1050 units/hr Check anti-Xa level in 8 hours and daily while on heparin Continue to monitor H&H and platelets  Antonietta Jewel, PharmD, [place] Clinical Pharmacist  Pager: [phone] Phone: [phone] 08/08/2018,1:48 PM

## 2018-08-08 NOTE — Progress Notes (Signed)
Paged by RN due to patient vomiting. Pt says he didn't feel well prior to HD and has not had much to eat or drink today. They took off 3 L today in HD prior to his arrival. I changed PRN antiemetics to IV. Will give 250 cc bolus. Discussed with Dr Aundra Dubin.  Georgiana Shore, NP

## 2018-08-08 NOTE — Progress Notes (Signed)
Hypoglycemic Event  CBG: 67  Treatment: D50 25 mL (12.5 gm)  Symptoms: None  Follow-up CBG: Time:  CBG Result:  Possible Reasons for Event: Other: N/V - unable to take POs   Comments/MD notified     Grant Fontana

## 2018-08-08 NOTE — Progress Notes (Signed)
LVAD Coordinator Rounding Note:  Pt admitted 08/08/18 for preoperative anticoagulation management for 2nd state of LUE AV fistula (scheduled 08/09/18.)   HM III LVAD implanted on 06/12/17 by Dr. Prescott Gum under Destination Therapy criteria due to renal insufficiency. Pump exchange on 03/01/18 for outflow graft twist/obstruction.   Pt is very sleepy upon my arrival to his room. He had HD today, with 3L fluid removed. Had an episode of vomiting this afternoon.   Vital signs: Temp: 98.6 HR:  85 Doppler Pressure: not done Auto BP: 115/80 (92) O2 Sat: 100% on RA Wt: 80.5kg  LVAD interrogation reveals:  Speed: 5500 Flow: 3.3 Power: 4.4 w PI: 6.7 Alarms: 2 low flow alarms 11/29 (sent to ER from dialysis- received fluid in ER.)  Events: several PI events 11/27 & 11/29 during dialysis. Otherwise, rare PI events.   Hematocrit: 34 Fixed speed: 5500 Low speed limit: 5200  Drive Line:  Weekly dressing changes. Next dressing change is due   Labs: LDH trend: 238  INR trend: 1.39  Blood Products:      Anticoagulation Plan: - INR Goal: 2.0 - 3.0 - ASA Dose: 81 mg daily  - Heparin gtt until INR therapeutic - per pharmacy  Device: -St Jude dual  -Therapies: on 200 bpm - Monitor: on VT 150 bpm  Adverse Events on VAD: - ESRD post LVAD; started CVVH 06/15/17; HD started 06/20/17 - Hospitalization 11/5 - 07/16/17 > gastroparesis diagnosis after EGD - Hospitalization 11/26 - 08/04/17 > gastroparesis - Hospitalization 1/8 - 09/16/17>gastroparesis - [facility] to [facility]. Corliss Parish at [facility] - 10/31/17>rightupper arm arteriovenous graft with Artegraft; ligation of right first stage brachial vein transposition  - 12/11/13 elective admission for thrombectomy for AV graft in RUA  - 12/20/17 > intractable N/V - 01/19/18 > intractable N/V sent by EMS from dialysis center - 03/01/18 > outflow graft twist/occulsion requiring pump exchange - 05/23/18 > admitted for non-working fistula in RUA - 06/10/18 > admitted  for intractable N/V    Plan/Recommendations:  1. Please page VAD coordinator with any equipment questions or concerns. 2. VAD coordinator will accompany patient to OR Thursday morning 08/09/18. 3. VAD coordinator will accompany patient to [place] Thursday afternoon 08/09/18.  [name] RN [place] Coordinator  Office: [phone]  24/7 Pager: [phone]

## 2018-08-08 NOTE — Plan of Care (Signed)
  Problem: Education: Goal: Patient will understand all VAD equipment and how it functions Outcome: Progressing   Problem: Education: Goal: Knowledge of General Education information will improve Description Including pain rating scale, medication(s)/side effects and non-pharmacologic comfort measures Outcome: Progressing

## 2018-08-08 NOTE — H&P (Addendum)
Advanced Heart Failure VAD History and Physical Note   PCP-Cardiologist: No primary care provider on file.   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 He has LUE AVR 05/2018.  See note from 06/28/18 for extensive history.   He was admitted in 9/19 for LUE AVF placement.    Two ER visits 07/2018. Once for CP and nausea after HD. Once for a low flow alarm after HD. Both resolved with IV fluids. Suspected that dry weight was too low and he was getting too much fluid pulled off.  He is being admitted today for perioperative anticoagulation management for 2nd stage of LUE AV fistula (scheduled for 12/5). He feels fine today other than some nausea. Denies CP, SOB, orthopnea. Denies cough, fever, or chills. Last dose of coumadin was Sunday. Denies any bleeding.   Labs pending.  LVAD INTERROGATION:  HeartMate III LVAD:  Flow 3.3 liters/min, speed 5500, power 4.2, PI 6.3.  Rare PI events. Last low flow 11/29  Review of Systems: [y] = yes, [ ]  = no   General: Weight gain [ ] ; Weight loss [ ] ; Anorexia [ ] ; Fatigue [ ] ; Fever [ ] ; Chills [ ] ; Weakness [ ]   Cardiac: Chest pain/pressure [ ] ; Resting SOB [ ] ; Exertional SOB [ ] ; Orthopnea [ ] ; Pedal Edema [ ] ; Palpitations [ ] ; Syncope [ ] ; Presyncope [ ] ; Paroxysmal nocturnal dyspnea[ ]   Pulmonary: Cough [ ] ; Wheezing[ ] ; Hemoptysis[ ] ; Sputum [ ] ; Snoring [ ]   GI: Vomiting[ ] ; Dysphagia[ ] ; Melena[ ] ; Hematochezia [ ] ; Heartburn[ ] ; Abdominal pain [ ] ; Constipation [ ] ; Diarrhea [ ] ; BRBPR [ ]   GU: Hematuria[ ] ; Dysuria [ ] ; Nocturia[ ]   Vascular: Pain in legs with walking [ ] ; Pain in feet with lying flat [ ] ; Non-healing sores [ ] ; Stroke [ ] ; TIA [  ]; Slurred speech [ ] ;  Neuro: Headaches[ ] ; Vertigo[ ] ; Seizures[ ] ; Paresthesias[ ] ;Blurred vision [ ] ; Diplopia [ ] ; Vision changes [ ]   Ortho/Skin: Arthritis [ ] ; Joint pain [ ] ; Muscle pain [ ] ; Joint swelling [ ] ; Back Pain [ ] ; Rash [ ]   Psych: Depression[ ] ; Anxiety[ ]   Heme: Bleeding problems [ ] ; Clotting disorders [ ] ; Anemia [ ]   Endocrine: Diabetes [ ] ; Thyroid dysfunction[ ]     Home Medications Prior to Admission medications   Medication Sig Start Date End Date Taking? Authorizing Provider  amiodarone (PACERONE) 200 MG tablet Take 200 mg by mouth daily.    [provider]  aspirin EC 81 MG tablet Take 1 tablet (81 mg total) by mouth daily. 07/26/17   Larey Dresser, MD  atorvastatin (LIPITOR) 40 MG tablet Take 1 tablet (40 mg total) by mouth daily at 6 PM. 01/25/18   [name], MD  calcitRIOL (ROCALTROL) 0.5 MCG capsule Take 1 capsule (0.5 mcg total) by mouth every Monday, Wednesday, and Friday. 06/06/18   Georgiana Shore, NP  citalopram (CELEXA) 20 MG tablet Take 1 tablet (20 mg total) by mouth daily. 03/19/18   Georgiana Shore, NP  docusate sodium (COLACE) 100 MG capsule Take 100 mg by mouth daily as needed for mild constipation.    [provider]  dronabinol (MARINOL) 2.5 MG capsule Take 1 capsule (2.5 mg total) by mouth 2 (two) times daily before lunch and supper. 03/27/18   Larey Dresser, MD  enoxaparin (LOVENOX) 80 MG/0.8ML injection Inject 0.75 mLs (75 mg total) into the skin every 12 (twelve) hours. Patient not taking: Reported on 08/03/2018 07/06/18   Larey Dresser, MD  erythromycin (E-MYCIN) 250 MG tablet Take 1 tablet (250 mg total) by mouth 3 (three) times daily. 03/25/18   Isaiah Serge, NP  insulin aspart (NOVOLOG) 100 UNIT/ML injection Inject 5 Units into the skin 3 (three) times daily with meals. 01/22/18   Georgiana Shore, NP  insulin glargine (LANTUS) 100 UNIT/ML injection Inject 0.1 mLs (10 Units total) into the skin daily.  05/09/18   Lovenia Kim, MD  isosorbide-hydrALAZINE (BIDIL) 20-37.5 MG tablet Take 1.5 tablets by mouth every 8 (eight) hours. Take 1.5 tablets every 8 hours on non dialysis days. Patient taking differently: Take 1 tablet by mouth daily.  03/19/18   Georgiana Shore, NP  lidocaine-prilocaine (EMLA) cream Apply 1 application topically as needed (to numb port).    [provider]  LORazepam (ATIVAN) 0.5 MG tablet Take 1 tablet (0.5 mg total) by mouth every 12 (twelve) hours as needed (nausea). 03/27/18   Larey Dresser, MD  metoCLOPramide (REGLAN) 10 MG tablet Take 1 tablet (10 mg total) by mouth 3 (three) times daily. 01/22/18   Georgiana Shore, NP  multivitamin (RENA-VIT) TABS tablet Take 1 tablet by mouth daily.    [provider]  pantoprazole (PROTONIX) 40 MG tablet Take 40 mg by mouth 2 (two) times daily.     [provider]  promethazine (PHENERGAN) 12.5 MG tablet Take 1 tablet (12.5 mg total) by mouth every 6 (six) hours as needed for nausea or vomiting. 07/06/18   Larey Dresser, MD  sevelamer carbonate (RENVELA) 800 MG tablet Take 1,600-4,000 mg by mouth See admin instructions. 4060m three times daily with meals and 16075mtwice daily with snacks    [provider]  warfarin (COUMADIN) 5 MG tablet Take 5-7.5 mg by mouth See admin instructions. Take 5 mg (1 tablet) daily except 7.5 mg (1 and 1/2 tablets) on Tuesdays and Fridays    [provider]    Past Medical History: Past Medical History:  Diagnosis Date  . Angina   . ASCVD (arteriosclerotic cardiovascular disease)    , Anterior infarction 2005, LAD diagonal bifurcation intervention 03/2004  . Automatic implantable cardiac defibrillator -St. Jude's       . Benign neoplasm of colon   . CHF (congestive heart failure) ([facility]  . Chronic systolic heart failure ([facility]  . Coronary artery disease     Widely patent previously placed stents in the left anterior   . Crohn's disease ([facility]  . Deep  venous thrombosis (HCC)    Recurrent-on Coumadin  . Dialysis patient ([facility]  . Dyspnea   . Gastroparesis   . GERD (gastroesophageal reflux disease)   . High cholesterol   . Hyperlipidemia   . Hypersomnolent    Previous diagnosis of narcolepsy  . Hypertension, essential   . Ischemic cardiomyopathy    Ejection fraction 15-20% catheterization 2010  . Type II or unspecified type diabetes mellitus without mention of complication, not stated as uncontrolled   . Unspecified gastritis and gastroduodenitis without mention of hemorrhage     Past Surgical History: Past Surgical History:  Procedure Laterality Date  .  A/V SHUNTOGRAM N/A 05/24/2018   Procedure: A/V SHUNTOGRAM;  Surgeon: Marty Heck, MD;  Location: [place] [facility];  Service: Cardiovascular;  Laterality: N/A;  . APPENDECTOMY    . AV FISTULA PLACEMENT Right 06/26/2017   Procedure: ARTERIOVENOUS (AV) FISTULA CREATION VERSUS GRAFT RIGHT  ARM;  Surgeon: Conrad Lynnwood, MD;  Location: [place];  Service: Vascular;  Laterality: Right;  . AV FISTULA PLACEMENT Right 10/31/2017   Procedure: INSERTION OF ARTERIOVENOUS (AV) ARTEGRAFT,  RIGHT UPPER ARM;  Surgeon: Conrad Watson, MD;  Location: [place];  Service: Vascular;  Laterality: Right;  . AV FISTULA PLACEMENT Left 05/29/2018   Procedure: ARTERIOVENOUS (AV) FISTULA CREATION  LEFT UPPER EXTREMITY;  Surgeon: Waynetta Sandy, MD;  Location: [place];  Service: Vascular;  Laterality: Left;  . CARDIAC DEFIBRILLATOR PLACEMENT  2010   St. Jude ICD  . CENTRAL LINE INSERTION Right 05/24/2018   Procedure: CENTRAL LINE INSERTION;  Surgeon: Marty Heck, MD;  Location: [place] [facility];  Service: Cardiovascular;  Laterality: Right;  . COLECTOMY  ~ 2003   "for Crohn's" ascending colon.    . CORONARY STENT INTERVENTION N/A 11/25/2016   Procedure: Coronary Stent Intervention;  Surgeon: Leonie Man, MD;  Location: [place] [facility];  Service: Cardiovascular;  Laterality: N/A;  .  EP IMPLANTABLE DEVICE N/A 09/14/2016   Procedure: ICD Generator Changeout;  Surgeon: Deboraha Sprang, MD;  Location: [place] [facility];  Service: Cardiovascular;  Laterality: N/A;  . ESOPHAGOGASTRODUODENOSCOPY N/A 07/12/2017   Procedure: ESOPHAGOGASTRODUODENOSCOPY (EGD);  Surgeon: Jerene Bears, MD;  Location[facility] ENDOSCOPY;  Service: Gastroenterology;  Laterality: N/A;  VAD pt so VAD team will need to accompany pt.   [name] FETAL SURGERY FOR CONGENITAL HERNIA     ????? pt has no knowledge of this..    . INGUINAL HERNIA REPAIR    . INSERTION OF DIALYSIS CATHETER Right 06/26/2017   Procedure: INSERTION OF TUNNELED  DIALYSIS CATHETER RIGHT INTERNAL JUGULAR;  Surgeon: Conrad Brewster, MD;  Location: [place];  Service: Vascular;  Laterality: Right;  . INSERTION OF IMPLANTABLE LEFT VENTRICULAR ASSIST DEVICE N/A 06/12/2017   Procedure: INSERTION OF IMPLANTABLE LEFT VENTRICULAR ASSIST [place] 3;  Surgeon: Ivin Poot, MD;  Location: [place];  Service: Open Heart Surgery;  Laterality: N/A;  HM3 LVAD  CIRC ARREST  NITRIC OXIDE  . INSERTION OF IMPLANTABLE LEFT VENTRICULAR ASSIST DEVICE N/A 02/28/2018   Procedure: REDO INSERTION OF IMPLANTABLE LEFT VENTRICULAR ASSIST DEVICE - HEARTMATE 3;  Surgeon: Ivin Poot, MD;  Location: [place];  Service: Open Heart Surgery;  Laterality: N/A;  . IR REMOVAL TUN CV CATH W/O FL  02/26/2018  . IR THORACENTESIS ASP PLEURAL SPACE W/IMG GUIDE  06/28/2017  . LIGATION OF ARTERIOVENOUS  FISTULA Right 10/31/2017   Procedure: LIGATION OF BRACHIO-BASILIC VEIN TRANSPOSITION RIGHT ARM;  Surgeon: Conrad , MD;  Location: [place];  Service: Vascular;  Laterality: Right;  . MULTIPLE EXTRACTIONS WITH ALVEOLOPLASTY N/A 06/08/2017   Procedure: MULTIPLE EXTRACTION WITH ALVEOLOPLASTY AND PRE PROSTHETIC SURGERY AS NEEDED;  Surgeon: Lenn Cal, DDS;  Location: [place];  Service: Oral Surgery;  Laterality: N/A;  . RIGHT HEART CATH N/A 06/07/2017   Procedure: RIGHT HEART CATH;   Surgeon: Larey Dresser, MD;  Location: [place] CV LAB;  Service: Cardiovascular;  Laterality: N/A;  . RIGHT HEART CATH N/A 02/08/2018   Procedure: RIGHT HEART CATH;  Surgeon: Larey Dresser, MD;  Location: [facility] INVASIVE CV  LAB;  Service: Cardiovascular;  Laterality: N/A;  . RIGHT/LEFT HEART CATH AND CORONARY ANGIOGRAPHY N/A 11/23/2016   Procedure: Right/Left Heart Cath and Coronary Angiography;  Surgeon: Larey Dresser, MD;  Location: [place] CV LAB;  Service: Cardiovascular;  Laterality: N/A;  . TEE WITHOUT CARDIOVERSION N/A 06/12/2017   Procedure: TRANSESOPHAGEAL ECHOCARDIOGRAM (TEE);  Surgeon: Prescott Gum, Collier Salina, MD;  Location: [place];  Service: Open Heart Surgery;  Laterality: N/A;  . TEE WITHOUT CARDIOVERSION N/A 02/28/2018   Procedure: TRANSESOPHAGEAL ECHOCARDIOGRAM (TEE);  Surgeon: Prescott Gum, Collier Salina, MD;  Location: [place];  Service: Open Heart Surgery;  Laterality: N/A;  . TEMPORARY DIALYSIS CATHETER Left 05/24/2018   Procedure: TEMPORARY DIALYSIS CATHETER;  Surgeon: Marty Heck, MD;  Location: [place] CV LAB;  Service: Cardiovascular;  Laterality: Left;  . THROMBECTOMY AND REVISION OF ARTERIOVENTOUS (AV) GORETEX  GRAFT Right 12/12/2017   Procedure: THROMBECTOMY ARTERIOVENTOUS (AV) GORETEX  GRAFT RIGHT UPPER ARM;  Surgeon: Conrad Winnebago, MD;  [facility];  Service: Vascular;  Laterality: Right;    Family History: Family History  Problem Relation Age of Onset  . Colon polyps Mother   . Diabetes Mother   . Heart attack Father   . Diabetes Father   . Stroke Paternal Grandfather   . Colitis Sister   . Heart disease Brother   . Colitis Sister   . Colon cancer Neg Hx     Social History: Social History   Socioeconomic History  . Marital status: Married    Spouse name: Not on file  . Number of children: Not on file  . Years of education: Not on file  . Highest education level: Not on file  Occupational History  . Not on file  Social Needs  . Financial resource  strain: Not on file  . Food insecurity:    Worry: Not on file    Inability: Not on file  . Transportation needs:    Medical: Not on file    Non-medical: Not on file  Tobacco Use  . Smoking status: Never Smoker  . Smokeless tobacco: Never Used  Substance and Sexual Activity  . Alcohol use: No  . Drug use: No  . Sexual activity: Never  Lifestyle  . Physical activity:    Days per week: Not on file    Minutes per session: Not on file  . Stress: Not on file  Relationships  . Social connections:    Talks on phone: Not on file    Gets together: Not on file    Attends religious service: Not on file    Active member of club or organization: Not on file    Attends meetings of clubs or organizations: Not on file    Relationship status: Not on file  Other Topics Concern  . Not on file  Social History Narrative  . Not on file    Allergies:  Allergies  Allergen Reactions  . Metformin And Related Diarrhea    Objective:    Vital Signs:   Temp:  [98.6 F (37 C)] 98.6 F (37 C) (12/04 1208) BP: (115)/(80) 115/80 (12/04 1208) Weight:  [80.5 kg] 80.5 kg (12/04 1208)   Filed Weights   08/08/18 1208  Weight: 80.5 kg    Mean arterial Pressure 90  Physical Exam    General:  No resp difficulty HEENT: Normal Neck: supple. JVP 5-6. Carotids 2+ bilat; no bruits. No lymphadenopathy or thyromegaly appreciated. Cor: Mechanical heart sounds with LVAD hum present. Left chest  HD cath.  Lungs: Clear Abdomen: soft, nontender, nondistended. No hepatosplenomegaly. No bruits or masses. Good bowel sounds. Driveline: C/D/I; securement device intact and driveline incorporated Extremities: no cyanosis, clubbing, rash, edema Neuro: alert & orientedx3, cranial nerves grossly intact. moves all 4 extremities w/o difficulty. Affect pleasant   Telemetry   NSR with PVCs 80s. Personally reviewed.   EKG   Pending  Labs    Basic Metabolic Panel: Recent Labs  Lab 08/03/18 1216  NA 135  K  3.5  CL 99  GLUCOSE 95  BUN 32*  CREATININE 5.10*    Liver Function Tests: No results for input(s): AST, ALT, ALKPHOS, BILITOT, PROT, ALBUMIN in the last 168 hours. No results for input(s): LIPASE, AMYLASE in the last 168 hours. No results for input(s): AMMONIA in the last 168 hours.  CBC: Recent Labs  Lab 08/03/18 1216  HGB 12.6*  HCT 37.0*    Cardiac Enzymes: No results for input(s): CKTOTAL, CKMB, CKMBINDEX, TROPONINI in the last 168 hours.  BNP: BNP (last 3 results) Recent Labs    03/02/18 0400 03/08/18 0008 03/15/18 0106  BNP 694.1* 1,576.6* 811.3*    ProBNP (last 3 results) No results for input(s): PROBNP in the last 8760 hours.   CBG: No results for input(s): GLUCAP in the last 168 hours.  Coagulation Studies: Recent Labs    08/06/18  INR 2.8    Other results:  Imaging     No results found.    Patient Profile:   [name] is a 51 y.o.with history of CAD s/p anterior MI in 2010 and NSTEMI in 3/18 with DES to pRCA and mLAD, diabetic, gastroparesis, Chrons disease, DMII and ischemic cardiomyopathy, 06/2017 s/p Heartmate 3 LVAD (emergent exchange 02/2018 due to outflow graft kink) and ESRD (HD MWF). He has LUE AVR 05/2018.  He is being admitted for peri-op management of 2nd stage of AVF.  Assessment/Plan:     1. Chronic systolic CHF: Ischemic cardiomyopathy. St Jude ICD. NSTEMI in March 2018 with DES to LAD and RCA, complicated by cardiogenic shock, low output requiring milrinone. Echo 8/18 with EF 20-25%, moderate MR. CPX (8/18) with severe functional impairment due to HF. He is s/p HM3 LVAD placement. He had pump thrombosis 6/19 due to [facility] outflow graft kinking.  He had pump exchange for another HM3.   - Volume stable. Managed by HD.  - Continue Bidil 1 tab TID on non-HD days.    - Continue ASA 81. Warfarin on hold for procedure. INR goal 2-3. Will start heparin when INR <1.8.  - He has been referred to [facility] for evaluation for heart/kidney  transplant. He will need a repeat RHC for this. Will schedule for this admission. 2. ESRD: Developed peri-operatively after HM3 placement.  He is tolerating HD, currently via catheter but has new maturing AV fistula. Going for 2nd stage of LUE AVF tomorrow at 7:15 am. Coumadin on hold.  - Will consult nephrology for HD. 3. CAD: NSTEMI in 3/18. LHC with 99% ulcerated lesion proximal RCA with left to right collaterals, 95% mid LAD stenosis after mid LAD stent. s/p PCI to RCA and LAD on 11/25/16.  - No s/s ischemia. - Continue statin and ASA. 4. h/o DVTs: Factor V Leiden heterozygote. - Will start heparin once INR <1.8 5. Diabetic gastroparesis: Difficult to manage. He has seen gastroparesis specialist at [facility]. Surprisingly, gastric emptying study was normal.  However, electrogastrogram showed poor gastric accomodation/capacity.  Therefore, small frequent meals recommended.  Also morning nausea  thought to be GERD and Protonix was increased.  Nausea seems to drive markedly high BP (resolves with nausea control).  - Continue reglan 5 mg tid/ac and Marinol.  - Continue Protonix bid.  - Make effort to eat small, more frequent meals.  - He will continue erythromycin. No change.  6. DM: HgbA1c well-controlled recently.  No change.  7. HTN: MAP 90.  HTN seems to be driven by nausea (see above), so will try to minimize nausea.  8. PAD: Moderate bilateral disease on ABIs in 8/18.  Denies claudication currently, no pedal ulcers. No change.  9. Atrial flutter: Paroxysmal.  Tele looks like NSR. Check EKG today.   I reviewed the LVAD parameters from today, and compared the results to the patient's prior recorded data.  No programming changes were made.  The LVAD is functioning within specified parameters.  The patient performs LVAD self-test daily.  LVAD interrogation was negative for any significant power changes, alarms or PI events/speed drops.  LVAD equipment check completed and is in good working  order.  Back-up equipment present.   LVAD education done on emergency procedures and precautions and reviewed exit site care.  Length of Stay: [place], NP 08/08/2018, 12:38 PM  VAD Team Pager [phone] (7am - 7am) +++VAD ISSUES ONLY+++   Advanced Heart Failure Team Pager [phone] (M-F; [place])  Please contact [facility] for night-coverage after hours (4p -7a ) and weekends on [email or web address] for all non- LVAD Issues  Patient seen with NP, agree with the above note.  He is admitted today for 2nd stage of AV fistula creation.   Mild nausea today, no vomiting.  Not severe like one of his typical gastroparesis flares.  Will provide symptomatic treatment.   Awaiting INR today.  Will cover with heparin gtt when INR < 1.8.    Needs RHC for transplant workup.  Will do tomorrow after surgery or Friday.  Discussed risks/benefits with patient and he agrees to proceed.   He has not had any low flow alarms since HD last week.  LVAD parameters are stable.  Suspect that he needs his dry weight raised, he likely has gained some weight and when attempt is made to dialyze to prior dry weight, he is underfilling. Discussed with Dr. Posey Pronto.   Turner Baillie 08/08/2018 1:30 PM

## 2018-08-09 ENCOUNTER — Inpatient Hospital Stay (HOSPITAL_COMMUNITY)
Admission: RE | Disposition: A | Discharge status: Home / Self Care | Payer: Self-pay | Source: Home / Self Care | Attending: Cardiology

## 2018-08-09 ENCOUNTER — Encounter (HOSPITAL_COMMUNITY): Payer: Self-pay | Admitting: Critical Care Medicine

## 2018-08-09 ENCOUNTER — Inpatient Hospital Stay (HOSPITAL_COMMUNITY): Payer: BLUE CROSS/BLUE SHIELD | Admitting: Critical Care Medicine

## 2018-08-09 ENCOUNTER — Telehealth: Payer: Self-pay | Admitting: Vascular Surgery

## 2018-08-09 ENCOUNTER — Encounter (HOSPITAL_COMMUNITY)
Admission: RE | Disposition: A | Discharge status: Home / Self Care | Payer: Self-pay | Source: Home / Self Care | Attending: Cardiology

## 2018-08-09 DIAGNOSIS — I509 Heart failure, unspecified: Secondary | ICD-10-CM

## 2018-08-09 DIAGNOSIS — N186 End stage renal disease: Secondary | ICD-10-CM

## 2018-08-09 DIAGNOSIS — Z992 Dependence on renal dialysis: Secondary | ICD-10-CM

## 2018-08-09 HISTORY — PX: AV FISTULA PLACEMENT: SHX1204

## 2018-08-09 HISTORY — PX: FISTULOGRAM: SHX5832

## 2018-08-09 HISTORY — PX: RIGHT HEART CATH: CATH118263

## 2018-08-09 LAB — GLUCOSE, CAPILLARY
GLUCOSE-CAPILLARY: 93 mg/dL (ref 70–99)
Glucose-Capillary: 82 mg/dL (ref 70–99)
Glucose-Capillary: 95 mg/dL (ref 70–99)
Glucose-Capillary: 192 mg/dL — ABNORMAL HIGH (ref 70–99)

## 2018-08-09 LAB — POCT I-STAT 3, VENOUS BLOOD GAS (G3P V)
Acid-Base Excess: 1 mmol/L (ref 0.0–2.0)
Acid-Base Excess: 1 mmol/L (ref 0.0–2.0)
BICARBONATE: 27.1 mmol/L (ref 20.0–28.0)
Bicarbonate: 27.6 mmol/L (ref 20.0–28.0)
O2 Saturation: 67 %
O2 Saturation: 72 %
PCO2 VEN: 50.6 mmHg (ref 44.0–60.0)
PO2 VEN: 38 mmHg (ref 32.0–45.0)
TCO2: 29 mmol/L (ref 22–32)
TCO2: 29 mmol/L (ref 22–32)
pCO2, Ven: 48 mmHg (ref 44.0–60.0)
pH, Ven: 7.345 (ref 7.250–7.430)
pH, Ven: 7.36 (ref 7.250–7.430)
pO2, Ven: 40 mmHg (ref 32.0–45.0)

## 2018-08-09 LAB — CBC
HCT: 30.9 % — ABNORMAL LOW (ref 39.0–52.0)
Hemoglobin: 10 g/dL — ABNORMAL LOW (ref 13.0–17.0)
MCH: 28.3 pg (ref 26.0–34.0)
MCHC: 32.4 g/dL (ref 30.0–36.0)
MCV: 87.5 fL (ref 80.0–100.0)
NRBC: 0 % (ref 0.0–0.2)
Platelets: 195 10*3/uL (ref 150–400)
RBC: 3.53 MIL/uL — ABNORMAL LOW (ref 4.22–5.81)
RDW: 14.6 % (ref 11.5–15.5)
WBC: 7.8 10*3/uL (ref 4.0–10.5)

## 2018-08-09 LAB — HEPARIN LEVEL (UNFRACTIONATED)
Heparin Unfractionated: 0.19 IU/mL — ABNORMAL LOW (ref 0.30–0.70)
Heparin Unfractionated: <0.1 IU/mL — ABNORMAL LOW (ref 0.30–0.70)

## 2018-08-09 LAB — BASIC METABOLIC PANEL
Anion gap: 11 (ref 5–15)
BUN: 22 mg/dL — ABNORMAL HIGH (ref 6–20)
CHLORIDE: 97 mmol/L — AB (ref 98–111)
CO2: 26 mmol/L (ref 22–32)
Calcium: 8.6 mg/dL — ABNORMAL LOW (ref 8.9–10.3)
Creatinine, Ser: 4.46 mg/dL — ABNORMAL HIGH (ref 0.61–1.24)
GFR calc non Af Amer: 14 mL/min — ABNORMAL LOW (ref >60)
GFR, EST AFRICAN AMERICAN: 16 mL/min — AB (ref >60)
Glucose, Bld: 189 mg/dL — ABNORMAL HIGH (ref 70–99)
Potassium: 3.4 mmol/L — ABNORMAL LOW (ref 3.5–5.1)
SODIUM: 134 mmol/L — AB (ref 135–145)

## 2018-08-09 LAB — MAGNESIUM: Magnesium: 2.8 mg/dL — ABNORMAL HIGH (ref 1.7–2.4)

## 2018-08-09 LAB — PROTIME-INR
INR: 1.1
Prothrombin Time: 14.2 seconds (ref 11.4–15.2)

## 2018-08-09 LAB — LACTATE DEHYDROGENASE: LDH: 196 U/L — ABNORMAL HIGH (ref 98–192)

## 2018-08-09 SURGERY — FISTULOGRAM
Anesthesia: Monitor Anesthesia Care | Site: Arm Upper | Laterality: Left

## 2018-08-09 SURGERY — RIGHT HEART CATH
Anesthesia: LOCAL

## 2018-08-09 MED ORDER — LIDOCAINE HCL 1 % IJ SOLN
INTRAMUSCULAR | Status: DC | PRN
  Administered 2018-08-09: 45 mL

## 2018-08-09 MED ORDER — FENTANYL CITRATE (PF) 250 MCG/5ML IJ SOLN
INTRAMUSCULAR | Status: DC | PRN
  Administered 2018-08-09 (×6): 25 ug via INTRAVENOUS

## 2018-08-09 MED ORDER — MIDAZOLAM HCL 2 MG/2ML IJ SOLN
INTRAMUSCULAR | Status: DC | PRN
  Administered 2018-08-09: 1 mg via INTRAVENOUS

## 2018-08-09 MED ORDER — LIDOCAINE HCL (PF) 1 % IJ SOLN
INTRAMUSCULAR | Status: AC
  Filled 2018-08-09: qty 30

## 2018-08-09 MED ORDER — ONDANSETRON HCL 4 MG/2ML IJ SOLN: 4.0000 mg | Freq: Once | INTRAMUSCULAR | Status: DC | PRN

## 2018-08-09 MED ORDER — ONDANSETRON HCL 4 MG/2ML IJ SOLN
INTRAMUSCULAR | Status: DC | PRN
  Administered 2018-08-09: 4 mg via INTRAVENOUS

## 2018-08-09 MED ORDER — SODIUM CHLORIDE 0.9 % IV SOLN
INTRAVENOUS | Status: AC
  Filled 2018-08-09: qty 1.2

## 2018-08-09 MED ORDER — HYDROMORPHONE HCL 1 MG/ML IJ SOLN
INTRAMUSCULAR | Status: AC
  Filled 2018-08-09: qty 1

## 2018-08-09 MED ORDER — SODIUM CHLORIDE 0.9% FLUSH: 3.0000 mL | INTRAVENOUS | Status: DC | PRN

## 2018-08-09 MED ORDER — HEPARIN (PORCINE) IN NACL 1000-0.9 UT/500ML-% IV SOLN
INTRAVENOUS | Status: AC
  Filled 2018-08-09: qty 1000

## 2018-08-09 MED ORDER — WARFARIN SODIUM 7.5 MG PO TABS
7.5000 mg | ORAL_TABLET | Freq: Once | ORAL | Status: AC
  Administered 2018-08-09: 7.5 mg via ORAL
  Filled 2018-08-09: qty 1

## 2018-08-09 MED ORDER — SODIUM CHLORIDE 0.9% FLUSH
3.0000 mL | Freq: Two times a day (BID) | INTRAVENOUS | Status: DC
  Administered 2018-08-09 – 2018-08-17 (×14): 3 mL via INTRAVENOUS

## 2018-08-09 MED ORDER — IODIXANOL 320 MG/ML IV SOLN
INTRAVENOUS | Status: DC | PRN
  Administered 2018-08-09: 15 mL via INTRA_ARTERIAL

## 2018-08-09 MED ORDER — HEPARIN (PORCINE) 25000 UT/250ML-% IV SOLN
1350.0000 [IU]/h | INTRAVENOUS | Status: DC
  Administered 2018-08-09: 1300 [IU]/h via INTRAVENOUS
  Administered 2018-08-11: 1550 [IU]/h via INTRAVENOUS
  Filled 2018-08-09 (×3): qty 250

## 2018-08-09 MED ORDER — WARFARIN - PHARMACIST DOSING INPATIENT: Freq: Every day | Status: DC

## 2018-08-09 MED ORDER — SODIUM CHLORIDE 0.9 % IV BOLUS
250.0000 mL | Freq: Once | INTRAVENOUS | Status: AC
  Administered 2018-08-09: 250 mL via INTRAVENOUS

## 2018-08-09 MED ORDER — MIDAZOLAM HCL 5 MG/5ML IJ SOLN
INTRAMUSCULAR | Status: DC | PRN
  Administered 2018-08-09 (×2): 1 mg via INTRAVENOUS

## 2018-08-09 MED ORDER — SODIUM CHLORIDE 0.9 % IV SOLN
250.0000 mL | INTRAVENOUS | Status: DC | PRN
  Administered 2018-08-12: 250 mL via INTRAVENOUS

## 2018-08-09 MED ORDER — 0.9 % SODIUM CHLORIDE (POUR BTL) OPTIME
TOPICAL | Status: DC | PRN
  Administered 2018-08-09: 1000 mL

## 2018-08-09 MED ORDER — LIDOCAINE HCL (PF) 1 % IJ SOLN
INTRAMUSCULAR | Status: DC | PRN
  Administered 2018-08-09: 15 mL

## 2018-08-09 MED ORDER — SODIUM CHLORIDE 0.9 % IV SOLN
INTRAVENOUS | Status: DC | PRN
  Administered 2018-08-09: 25 ug/min via INTRAVENOUS

## 2018-08-09 MED ORDER — ONDANSETRON HCL 4 MG/2ML IJ SOLN
4.0000 mg | Freq: Four times a day (QID) | INTRAMUSCULAR | Status: DC | PRN
  Administered 2018-08-10: 4 mg via INTRAVENOUS

## 2018-08-09 MED ORDER — PROPOFOL 500 MG/50ML IV EMUL
INTRAVENOUS | Status: DC | PRN
  Administered 2018-08-09: 25 ug/kg/min via INTRAVENOUS

## 2018-08-09 MED ORDER — MIDAZOLAM HCL 2 MG/2ML IJ SOLN
INTRAMUSCULAR | Status: AC
  Filled 2018-08-09: qty 2

## 2018-08-09 MED ORDER — HYDROMORPHONE HCL 1 MG/ML IJ SOLN
0.2500 mg | INTRAMUSCULAR | Status: DC | PRN
  Administered 2018-08-09: 0.5 mg via INTRAVENOUS

## 2018-08-09 MED ORDER — DARBEPOETIN ALFA 40 MCG/0.4ML IJ SOSY
40.0000 ug | PREFILLED_SYRINGE | INTRAMUSCULAR | Status: DC
  Administered 2018-08-10: 40 ug via INTRAVENOUS
  Filled 2018-08-09: qty 0.4

## 2018-08-09 MED ORDER — HEPARIN (PORCINE) IN NACL 1000-0.9 UT/500ML-% IV SOLN
INTRAVENOUS | Status: DC | PRN
  Administered 2018-08-09: 500 mL

## 2018-08-09 MED ORDER — FENTANYL CITRATE (PF) 250 MCG/5ML IJ SOLN
INTRAMUSCULAR | Status: AC
  Filled 2018-08-09: qty 5

## 2018-08-09 MED ORDER — ACETAMINOPHEN 325 MG PO TABS
650.0000 mg | ORAL_TABLET | ORAL | Status: DC | PRN
  Administered 2018-08-10 – 2018-08-11 (×2): 650 mg via ORAL
  Filled 2018-08-09 (×2): qty 2

## 2018-08-09 MED ORDER — SODIUM CHLORIDE 0.9 % IV SOLN
INTRAVENOUS | Status: DC | PRN
  Administered 2018-08-09: 09:00:00

## 2018-08-09 MED ORDER — FENTANYL CITRATE (PF) 100 MCG/2ML IJ SOLN
INTRAMUSCULAR | Status: AC
  Filled 2018-08-09: qty 2

## 2018-08-09 MED ORDER — FENTANYL CITRATE (PF) 100 MCG/2ML IJ SOLN
INTRAMUSCULAR | Status: DC | PRN
  Administered 2018-08-09: 25 ug via INTRAVENOUS

## 2018-08-09 MED ORDER — HEPARIN (PORCINE) IN NACL 1000-0.9 UT/500ML-% IV SOLN
INTRAVENOUS | Status: AC
  Filled 2018-08-09: qty 500

## 2018-08-09 MED ORDER — CHLORHEXIDINE GLUCONATE CLOTH 2 % EX PADS
6.0000 | MEDICATED_PAD | Freq: Every day | CUTANEOUS | Status: DC
  Administered 2018-08-11 – 2018-08-14 (×4): 6 via TOPICAL

## 2018-08-09 MED ORDER — MEPERIDINE HCL 50 MG/ML IJ SOLN: 6.2500 mg | INTRAMUSCULAR | Status: DC | PRN

## 2018-08-09 SURGICAL SUPPLY — 36 items
ADH SKN CLS APL DERMABOND .7 (GAUZE/BANDAGES/DRESSINGS) ×2
ARMBAND PINK RESTRICT EXTREMIT (MISCELLANEOUS) ×3 IMPLANT
CANISTER SUCT 3000ML PPV (MISCELLANEOUS) ×3 IMPLANT
CLIP VESOCCLUDE MED 24/CT (CLIP) IMPLANT
CLIP VESOCCLUDE MED 6/CT (CLIP) IMPLANT
CLIP VESOCCLUDE SM WIDE 24/CT (CLIP) IMPLANT
CLIP VESOCCLUDE SM WIDE 6/CT (CLIP) IMPLANT
COVER DOME SNAP 22 D (MISCELLANEOUS) ×2 IMPLANT
COVER PROBE W GEL 5X96 (DRAPES) ×3 IMPLANT
COVER WAND RF STERILE (DRAPES) ×3 IMPLANT
DERMABOND ADVANCED (GAUZE/BANDAGES/DRESSINGS) ×1
DERMABOND ADVANCED .7 DNX12 (GAUZE/BANDAGES/DRESSINGS) ×2 IMPLANT
ELECT REM PT RETURN 9FT ADLT (ELECTROSURGICAL) ×3
ELECTRODE REM PT RTRN 9FT ADLT (ELECTROSURGICAL) ×2 IMPLANT
GLOVE BIO SURGEON STRL SZ7.5 (GLOVE) ×3 IMPLANT
GOWN STRL REUS W/ TWL LRG LVL3 (GOWN DISPOSABLE) ×4 IMPLANT
GOWN STRL REUS W/ TWL XL LVL3 (GOWN DISPOSABLE) ×2 IMPLANT
GOWN STRL REUS W/TWL LRG LVL3 (GOWN DISPOSABLE) ×6
GOWN STRL REUS W/TWL XL LVL3 (GOWN DISPOSABLE) ×3
GRAFT GORETEX STRT 4-7X45 (Vascular Products) ×2 IMPLANT
KIT BASIN OR (CUSTOM PROCEDURE TRAY) ×3 IMPLANT
KIT TURNOVER KIT B (KITS) ×3 IMPLANT
NS IRRIG 1000ML POUR BTL (IV SOLUTION) ×3 IMPLANT
PACK CV ACCESS (CUSTOM PROCEDURE TRAY) ×3 IMPLANT
PAD ARMBOARD 7.5X6 YLW CONV (MISCELLANEOUS) ×6 IMPLANT
SET MICROPUNCTURE 5F STIFF (MISCELLANEOUS) ×2 IMPLANT
SUT MNCRL AB 4-0 PS2 18 (SUTURE) ×5 IMPLANT
SUT PROLENE 5 0 C 1 24 (SUTURE) ×2 IMPLANT
SUT PROLENE 6 0 BV (SUTURE) ×3 IMPLANT
SUT SILK 2 0 SH (SUTURE) IMPLANT
SUT VIC AB 3-0 SH 27 (SUTURE) ×6
SUT VIC AB 3-0 SH 27X BRD (SUTURE) ×3 IMPLANT
SYR 10ML LL (SYRINGE) ×2 IMPLANT
TOWEL GREEN STERILE (TOWEL DISPOSABLE) ×3 IMPLANT
UNDERPAD 30X30 (UNDERPADS AND DIAPERS) ×3 IMPLANT
WATER STERILE IRR 1000ML POUR (IV SOLUTION) ×3 IMPLANT

## 2018-08-09 SURGICAL SUPPLY — 6 items
CATH SWAN GANZ 7F STRAIGHT (CATHETERS) ×1 IMPLANT
PACK CARDIAC CATHETERIZATION (CUSTOM PROCEDURE TRAY) ×2 IMPLANT
SHEATH PINNACLE 7F 10CM (SHEATH) ×1 IMPLANT
TRANSDUCER W/STOPCOCK (MISCELLANEOUS) ×2 IMPLANT
TUBING ART PRESS 72  MALE/FEM (TUBING) ×1
TUBING ART PRESS 72 MALE/FEM (TUBING) IMPLANT

## 2018-08-09 NOTE — Transfer of Care (Signed)
Immediate Anesthesia Transfer of Care Note  Patient: [name]  Procedure(s) Performed: FISTULOGRAM LEFT ARM (Left Arm Upper) INSERTION OF ARTERIOVENOUS (AV) GORE-TEX GRAFT LEFT UPPER ARM (Left Arm Upper)  Patient Location: PACU  Anesthesia Type:MAC  Level of Consciousness: awake, alert  and oriented  Airway & Oxygen Therapy: Patient Spontanous Breathing  Post-op Assessment: Report given to RN and Post -op Vital signs reviewed and stable  Post vital signs: Reviewed and stable  Last Vitals:  Vitals Value Taken Time  BP    Temp    Pulse 77 08/09/2018  9:10 AM  Resp    SpO2 100 % 08/09/2018  9:10 AM  Vitals shown include unvalidated device data.  Last Pain:  Vitals:   08/09/18 0349  TempSrc: Oral  PainSc:          Complications: No apparent anesthesia complications

## 2018-08-09 NOTE — Progress Notes (Signed)
ANTICOAGULATION CONSULT NOTE - Follow Up Consult  Pharmacy Consult for heparin Indication: LVAD/factor V  Labs: Recent Labs    08/08/18 1240 08/08/18 1248 08/08/18 2340  HGB  --  10.5*  --   HCT  --  32.7*  --   PLT  --  PLATELET CLUMPS NOTED ON SMEAR, UNABLE TO ESTIMATE  --   LABPROT 16.9*  --   --   INR 1.39  --   --   HEPARINUNFRC  --   --  0.19*  CREATININE  --  3.00*  --     Assessment: 52yo male subtherapeutic on heparin with initial dosing while Coumadin on hold.  Goal of Therapy:  Heparin level 0.3-0.7 units/ml   Plan:  Will increase heparin gtt by 3 units/kg/hr to 1300 units/hr and check level in 8 hours.    Wynona Neat, PharmD, BCPS  08/09/2018,12:40 AM

## 2018-08-09 NOTE — Progress Notes (Signed)
Transferred pt to OR holding via bed.  RN with pt, LVAD switched to battery.  Emergency bag and LVAD cart accompanied pt.

## 2018-08-09 NOTE — Progress Notes (Signed)
VAD coordinator notified of low PI (1.3-1.7) and low MAPs in 60s. Advised to give 250cc bolus and monitor. Patient asymptomatic.

## 2018-08-09 NOTE — Progress Notes (Signed)
[facility] Progress Note   Subjective:  Seen in room. S/p access surgery this morning, original plan was 2ng stage BVT, but vein found to be sclerotic, so AVG placed. Denies CP, dyspnea. No vomiting today.  Objective Vitals:   08/09/18 1235 08/09/18 1256 08/09/18 1300 08/09/18 1318  BP: (!) 125/101 (!) 105/93 103/87 98/81  Pulse: 73 72 75 77  Resp: 15 14 18 11   Temp:  98.1 F (36.7 C)    TempSrc:  Oral    SpO2: 100% 99% 100% 97%  Weight:      Height:       Physical Exam General: Well appearing man, NAD Heart: Continuous LVAD hum Lungs: CTA anteriorly Extremities: No LE edema Dialysis Access: TDC and new LUE AVG (no bruit heard, but has LVAD so likely won't?)  Additional Objective Labs: Basic Metabolic Panel: Recent Labs  Lab 08/03/18 1216 08/08/18 1248 08/09/18 0245  NA 135 134* 134*  K 3.5 3.2* 3.4*  CL 99 97* 97*  CO2  --  23 26  GLUCOSE 95 140* 189*  BUN 32* 13 22*  CREATININE 5.10* 3.00* 4.46*  CALCIUM  --  9.1 8.6*   Liver Function Tests: Recent Labs  Lab 08/08/18 1248  AST 18  ALT 19  ALKPHOS 87  BILITOT 0.6  PROT 6.8  ALBUMIN 3.2*   CBC: Recent Labs  Lab 08/03/18 1216 08/08/18 1248 08/09/18 0245  WBC  --  8.2 7.8  NEUTROABS  --  6.4  --   HGB 12.6* 10.5* 10.0*  HCT 37.0* 32.7* 30.9*  MCV  --  86.7 87.5  PLT  --  PLATELET CLUMPS NOTED ON SMEAR, UNABLE TO ESTIMATE 195   CBG: Recent Labs  Lab 08/08/18 1817 08/08/18 1944 08/08/18 2123 08/09/18 0911 08/09/18 1129  GLUCAP 106* 95 112* 93 82   Medications: . sodium chloride    . sodium chloride    . heparin     . amiodarone  200 mg Oral Daily  . aspirin EC  81 mg Oral Daily  . atorvastatin  40 mg Oral q1800  . calcitRIOL  0.5 mcg Oral Q M,W,F  . citalopram  20 mg Oral Daily  . dronabinol  2.5 mg Oral BID AC  . erythromycin  250 mg Oral TID  . insulin aspart  0-5 Units Subcutaneous QHS  . insulin aspart  0-9 Units Subcutaneous TID WC  . insulin aspart  3 Units  Subcutaneous TID WC  . insulin glargine  8 Units Subcutaneous Daily  . isosorbide-hydrALAZINE  1 tablet Oral 3 times per day on Sun Tue Thu Sat  . metoCLOPramide  10 mg Oral TID  . multivitamin  1 tablet Oral Daily  . pantoprazole  40 mg Oral BID  . sevelamer carbonate  1,600 mg Oral 2 times per day  . sevelamer carbonate  4,000 mg Oral TID WC  . sodium chloride flush  3 mL Intravenous Q12H  . warfarin  7.5 mg Oral ONCE-1800  . Warfarin - Pharmacist Dosing Inpatient   Does not apply q1800    Dialysis Orders: MWF at [facility] 4:15, 400/800, EDW 80kg, 2K/3Ca bath, No heparin, TDC - Calcitriol 22mg PO q HD - No ESA recently, Hgb > 11  Assessment/Plan: 1.  Dialysis access: S/p LUE AVG 12/5 by Dr. CDonzetta Matters 2.  ESRD: Continue HD per usual MWF schedule. Not reaching EDW, but no signs of overload - will plan to raise EDW very slightly. 3. Hypertension/volume: BP controlled, no edema on  exam   4. Anemia: Hgb 10, will resume ESA with next HD. 5.  Metabolic bone disease: Ca ok, Phos pending. Continue home binders. 6.  T2DM: Per primary 7.  CAD/severe ICM with Heartmate 3 LVAD: Per HF team.  Veneta Penton, PA-C 08/09/2018, 2:31 PM  [facility] Pager: [phone]

## 2018-08-09 NOTE — Progress Notes (Signed)
VAD Coordinator Procedure Note:   VAD Coordinator met patient in cath lab room 9. Pt undergoing right heart cath per Dr. Aundra Dubin. Hemodynamics and VAD parameters monitored by myself and anesthesia throughout the procedure. Blood pressures were obtained with automatic cuff on right arm.    Time: Doppler Auto  BP Flow PI Power Speed  Pre-procedure:  1150   3.8 4.8 4.4 5500                    Secation Induction: 1200  124/96 (105) 3.5 5.8 4.4 5500   1215  124/99 (106) 3.4 6.2 4.3 5500                    Recovery Area: 1230  122/82 (96) 3.5 5.8 4.4 5500              Patient Disposition: Patient tolerated procedure well. Sheath removed in cath lab prior to transport back to 2C01.   [name] RN [place] Coordinator  Office: [phone]  24/7 Pager: [phone]

## 2018-08-09 NOTE — Plan of Care (Signed)
  Problem: Cardiac: Goal: LVAD will function as expected and patient will experience no clinical alarms Outcome: Progressing   Problem: Education: Goal: Patient will understand all VAD equipment and how it functions Outcome: Progressing Goal: Patient will be able to verbalize current INR target range and antiplatelet therapy for discharge home Outcome: Progressing   Problem: Education: Goal: Knowledge of General Education information will improve Description Including pain rating scale, medication(s)/side effects and non-pharmacologic comfort measures Outcome: Progressing   Problem: Health Behavior/Discharge Planning: Goal: Ability to manage health-related needs will improve Outcome: Progressing   Problem: Clinical Measurements: Goal: Ability to maintain clinical measurements within normal limits will improve Outcome: Progressing Goal: Will remain free from infection Outcome: Progressing Goal: Diagnostic test results will improve Outcome: Progressing Goal: Respiratory complications will improve Outcome: Progressing Goal: Cardiovascular complication will be avoided Outcome: Progressing   Problem: Activity: Goal: Risk for activity intolerance will decrease Outcome: Progressing   Problem: Nutrition: Goal: Adequate nutrition will be maintained Outcome: Progressing   Problem: Coping: Goal: Level of anxiety will decrease Outcome: Progressing   Problem: Elimination: Goal: Will not experience complications related to bowel motility Outcome: Progressing Goal: Will not experience complications related to urinary retention Outcome: Progressing   Problem: Pain Managment: Goal: General experience of comfort will improve Outcome: Progressing   Problem: Safety: Goal: Ability to remain free from injury will improve Outcome: Progressing   Problem: Skin Integrity: Goal: Risk for impaired skin integrity will decrease Outcome: Progressing

## 2018-08-09 NOTE — Anesthesia Postprocedure Evaluation (Signed)
Anesthesia Post Note  Patient: [name]  Procedure(s) Performed: FISTULOGRAM LEFT ARM (Left Arm Upper) INSERTION OF ARTERIOVENOUS (AV) GORE-TEX GRAFT LEFT UPPER ARM (Left Arm Upper)     Patient location during evaluation: PACU Anesthesia Type: MAC Level of consciousness: awake and alert Pain management: pain level controlled Vital Signs Assessment: post-procedure vital signs reviewed and stable Respiratory status: spontaneous breathing, nonlabored ventilation, respiratory function stable and patient connected to nasal cannula oxygen Cardiovascular status: stable and blood pressure returned to baseline Postop Assessment: no apparent nausea or vomiting Anesthetic complications: no    Last Vitals:  Vitals:   08/09/18 1036 08/09/18 1159  BP:    Pulse: 74   Resp: 15   Temp:    SpO2: 97% 100%    Last Pain:  Vitals:   08/09/18 1216  TempSrc:   PainSc: 0-No pain                 Lino Wickliff DAVID

## 2018-08-09 NOTE — Progress Notes (Signed)
   Resting comfortably post procedure.  Minimal hematoma left upper extremity.  Cannot palpate any thrill in graft although unlikely given existence of LVAD.  Will reevaluate tomorrow.  Ilyse Tremain C. Donzetta Matters, MD [facility] Office: [phone] Pager: [phone]

## 2018-08-09 NOTE — Telephone Encounter (Signed)
-----   Message from Gabriel Earing, [place] sent at 08/09/2018  9:00 AM EST ----- S/p RUA AVG 08/09/18.  Pt needs to f/u with Dr. Donzetta Matters in 2 weeks with duplex of graft.  Thanks

## 2018-08-09 NOTE — Progress Notes (Signed)
LVAD Coordinator Rounding Note:  Pt admitted 08/08/18 for preoperative anticoagulation management for 2nd state of LUE AV fistula (scheduled 08/09/18.)   HM III LVAD implanted on 06/12/17 by Dr. Prescott Gum under Destination Therapy criteria due to renal insufficiency. Pump exchange on 03/01/18 for outflow graft twist/obstruction.   Met patient in short stay for fistulagram and 2nd stage BVT vs graft with Dr. Donzetta Matters. Patient states that his nausea is better this morning.   Vital signs: Temp: 98.1 HR:  90 Doppler Pressure: 92 Auto BP: 103/87 (91) O2 Sat: 100% on RA Wt: 80.5>80.3kg  LVAD interrogation reveals:  Speed: 5500 Flow: 4.0 Power: 4.6 w PI: 3.8 Alarms: none Events: rare PI Hematocrit: 34 Fixed speed: 5500 Low speed limit: 5200  Drive Line:  Weekly dressing changes. Next dressing change is due 06/16/18.   Labs: LDH trend: 238>196  INR trend: 1.39>  Blood Products:      Anticoagulation Plan: - INR Goal: 2.0 - 3.0 - ASA Dose: 81 mg daily  - Heparin gtt until INR therapeutic - per pharmacy  Device: -St Jude dual  -Therapies: on 200 bpm - Monitor: on VT 150 bpm  Adverse Events on VAD: - ESRD post LVAD; started CVVH 06/15/17; HD started 06/20/17 - Hospitalization 11/5 - 07/16/17 > gastroparesis diagnosis after EGD - Hospitalization 11/26 - 08/04/17 > gastroparesis - Hospitalization 1/8 - 09/16/17>gastroparesis - referred to Dr. Corliss Parish at [facility] - 10/31/17>rightupper arm arteriovenous graft with Artegraft; ligation of right first stage brachial vein transposition  - 12/11/13 elective admission for thrombectomy for AV graft in RUA  - 12/20/17 > intractable N/V - 01/19/18 > intractable N/V sent by EMS from dialysis center - 03/01/18 > outflow graft twist/occulsion requiring pump exchange - 05/23/18 > admitted for non-working fistula in RUA - 06/10/18 > admitted for intractable N/V    Plan/Recommendations:  1. Please page VAD coordinator with any equipment questions or  concerns.   [name] RN [place] Coordinator  Office: [phone]  24/7 Pager: [phone]

## 2018-08-09 NOTE — Anesthesia Procedure Notes (Signed)
Procedure Name: MAC Date/Time: 08/09/2018 7:30 AM Performed by: Wilburn Cornelia, CRNA Pre-anesthesia Checklist: Patient identified, Emergency Drugs available, Suction available, Patient being monitored and Timeout performed Patient Re-evaluated:Patient Re-evaluated prior to induction Oxygen Delivery Method: Simple face mask Placement Confirmation: positive ETCO2 and breath sounds checked- equal and bilateral Dental Injury: Teeth and Oropharynx as per pre-operative assessment

## 2018-08-09 NOTE — Anesthesia Preprocedure Evaluation (Signed)
Anesthesia Evaluation  Patient identified by MRN, date of birth, ID band Patient awake    Reviewed: Allergy & Precautions, NPO status , Patient's Chart, lab work & pertinent test results  Airway Mallampati: I  TM Distance: >3 FB Neck ROM: Full    Dental   Pulmonary    Pulmonary exam normal        Cardiovascular hypertension, Pt. on medications + CAD, + Past MI and + CABG  Normal cardiovascular exam+ Cardiac Defibrillator      Neuro/Psych    GI/Hepatic GERD  Medicated and Controlled,  Endo/Other  diabetes, Type 2, Insulin Dependent  Renal/GU ESRF and DialysisRenal disease     Musculoskeletal   Abdominal   Peds  Hematology   Anesthesia Other Findings   Reproductive/Obstetrics                             Anesthesia Physical Anesthesia Plan  ASA: IV  Anesthesia Plan: MAC   Post-op Pain Management:    Induction: Intravenous  PONV Risk Score and Plan: 1 and Ondansetron  Airway Management Planned: Simple Face Mask  Additional Equipment:   Intra-op Plan:   Post-operative Plan:   Informed Consent: I have reviewed the patients History and Physical, chart, labs and discussed the procedure including the risks, benefits and alternatives for the proposed anesthesia with the patient or authorized representative who has indicated his/her understanding and acceptance.     Plan Discussed with: CRNA and Surgeon  Anesthesia Plan Comments:         Anesthesia Quick Evaluation

## 2018-08-09 NOTE — Progress Notes (Signed)
VAD Coordinator Procedure Note:   VAD Coordinator met patient in short stay/OR. Pt undergoing left arm fistulagram 2nd stage BVT vs graft placement per Dr. Donzetta Matters. Hemodynamics and VAD parameters monitored by myself and anesthesia throughout the procedure. Blood pressures were obtained with automatic cuff on right arm and correlated with Doppler. BP maintained with Neo drip per anesthesia.    Time: Doppler Auto  BP Flow PI Power Speed  Pre-procedure:  0650 92 103/87 (91) 4.0 3.8 4.6 5500   0715  99/83 (87) 4.0 3.6 4.5 5500   0735  98/75 (85) 4.0 4.4 4.3 5500  Secation Induction: 0745  102/87 (94) 4.0 3.8 4.3 5500   0800  88/71 (78) 4.2 3.2 4.3 5500   0815  100/66 (79) 4.0 4.3 4.5 5500   0830  90/70 (78) 3.9 4.6 4.3 5500   0845  110/99 (99) 3.6 6.1 4.4 5500   0855  111/90 (98) 3.8 5.7 4.3 5500  Recovery Area: 0910  108/90 (97) 3.8 4.8 4.4 5500   0930  103/89 (95) 3.8 5.0 4.6 5500   0945  96/83 (90) 3.8 4.6 4.5 5500     105/92 (98) 3.8 4.6 4.6 5500   1000   3.8 4.7 4.4 5500    Patient tolerated the procedure well. VAD Coordinator accompanied and remained with patient in recovery area.    Patient Disposition: Patient transported back to 2C01.  [name] RN [place] Coordinator  Office: [phone]  24/7 Pager: [phone]

## 2018-08-09 NOTE — Op Note (Signed)
    Patient name: [name] MRN: [id number] DOB: 11/22/1965 Sex: male  08/09/2018 Pre-operative Diagnosis: esrd Post-operative diagnosis:  Same Surgeon:  Erlene Quan C. Donzetta Matters, MD Assistant: Leontine Locket, PA Procedure Performed: 1.  Ultrasound-guided cannulation left upper arm AV fistula 2.  Left arm fistulogram 3.  Creation of left upper arm brachial artery to axillary vein av graft with 4-68m stretch ptfe  Indications: 52year old male with end-stage renal disease.  He also has a ventricular assist device.  We have placed a left arm basilic vein fistula that does not appear to have matured very well by duplex.  He is indicated for fistulogram possible second stage fistula versus graft  Findings: The fistula was sclerotic throughout its midportion was not going to be suitable for dialysis.  We placed a 4-7 mm PTFE graft from the takeoff of the fistula near the arterial anastomosis to the axillary vein.  At completion there was a signal with Doppler that was nonexistent with compression of the graft.   Procedure:  The patient was identified in the holding area and taken to the operating room where is placed supine the operative table MAC anesthesia was induced.  He was sterilely prepped and draped in the left upper extremity usual fashion, antibiotics were given, a timeout was called.  We began with anesthetizing his left upper extremity 1% lidocaine with epinephrine to a total of 25 cc.  Ultrasound was used to identify the basilic vein this was cannulated with direct ultrasound guidance with micropuncture needle followed by wire and sheath.  We then performed left upper extremity fistulogram which demonstrated a sclerotic fistula throughout its course.  Sheath was removed and pressure held for 5 minutes.  I then opened his previous incision dissected down had difficulty identifying the artery given the lack of pulsatility but was able to identify the fistula which was quite sizable and had good  flow at that area.  I marked this for orientation.  A counterincision was made near the axilla.  We dissected down to the axillary vein marked this for orientation divided branches between clips and ties.  I then tunneled a 4-7 mm PTFE graft and beveled it on the venous end.  The vein was clamped with a Cooley clamp opened longitudinally.  The graft was sewn end-to-side with 5-0 Prolene suture.  Upon completion we then flushed through the graft itself.  I then ligated the fistula just after the anastomosis clamped it and transected.  I spatulated trimmed the PTFE to size and sewed and and with 6-0 Prolene suture.  Prior to the completion of this anastomosis we allowed flushing all directions.  Upon completion there was no pulsatility or thrill given the presence of ventricular assist device we did have a signal in the runoff vein that was nonexistent with compression of the graft.  He also did signal in his radial artery consistent with his preoperative exam.  Satisfied with this we obtain hemostasis irrigated wound closed in layers with Vicryl and Monocryl.  He tolerated procedure without immediate complication.  EBL 50 cc   Jaleia Hanke C. CDonzetta Matters MD [facility] [place] FordOffice: [phone]: [phone]

## 2018-08-09 NOTE — Progress Notes (Addendum)
Advanced Heart Failure VAD Team Note  PCP-Cardiologist: No primary care provider on file.   Subjective:    This am underwent US-guided cannulation of LUE AV fistula and found to be sclerotic and not suitable for dialysis. LUE graft created from brachial artery to axillary vein. Coumadin on hold. Heparin gtt ordered, but on hold post surgery this am.   Feeling OK currently. Nausea has improved and denies any currently. No SOB at rest. Denies lightheadedness or dizziness.   LVAD Interrogation HM 3: Speed: 5500 Flow: 3.8 PI: 4.8 Power: 4.0. 3 PI events overnight  Objective:    Vital Signs:   Temp:  [98.1 F (36.7 C)-99.1 F (37.3 C)] 98.2 F (36.8 C) (12/05 1020) Pulse Rate:  [57-96] 74 (12/05 1036) Resp:  [9-19] 15 (12/05 1036) BP: (78-118)/(58-94) 108/85 (12/05 1020) SpO2:  [97 %-100 %] 97 % (12/05 1036) Weight:  [80.3 kg-80.5 kg] 80.3 kg (12/05 0547)   Mean arterial Pressure 90  Intake/Output:   Intake/Output Summary (Last 24 hours) at 08/09/2018 1038 Last data filed at 08/09/2018 0900 Gross per 24 hour  Intake 215 ml  Output 10 ml  Net 205 ml     Physical Exam    General:  NAD.  HEENT: normal Neck: supple. JVP 6-7 cm, Carotids 2+ bilat; no bruits. No lymphadenopathy or thyromegaly appreciated. Cor: Mechanical heart sounds with LVAD hum present. Left chest HD cath. Lungs: clear Abdomen: soft, nontender, nondistended. No hepatosplenomegaly. No bruits or masses. Good bowel sounds. Driveline: C/D/I; securement device intact and driveline incorporated Extremities: no cyanosis, clubbing, rash, edema Neuro: alert & orientedx3, cranial nerves grossly intact. moves all 4 extremities w/o difficulty. Affect pleasant  Telemetry   Appears to be in NSR 70s, personally reviewed.   EKG    08/08/18 appears to be NSR, interference noted.   Labs   Basic Metabolic Panel: Recent Labs  Lab 08/03/18 1216 08/08/18 1248 08/09/18 0245  NA 135 134* 134*  K 3.5 3.2* 3.4*  CL  99 97* 97*  CO2  --  23 26  GLUCOSE 95 140* 189*  BUN 32* 13 22*  CREATININE 5.10* 3.00* 4.46*  CALCIUM  --  9.1 8.6*  MG  --  1.6*  --     Liver Function Tests: Recent Labs  Lab 08/08/18 1248  AST 18  ALT 19  ALKPHOS 87  BILITOT 0.6  PROT 6.8  ALBUMIN 3.2*   No results for input(s): LIPASE, AMYLASE in the last 168 hours. No results for input(s): AMMONIA in the last 168 hours.  CBC: Recent Labs  Lab 08/03/18 1216 08/08/18 1248 08/09/18 0245  WBC  --  8.2 7.8  NEUTROABS  --  6.4  --   HGB 12.6* 10.5* 10.0*  HCT 37.0* 32.7* 30.9*  MCV  --  86.7 87.5  PLT  --  PLATELET CLUMPS NOTED ON SMEAR, UNABLE TO ESTIMATE 195    INR: Recent Labs  Lab 08/06/18 08/08/18 1240  INR 2.8 1.39    Other results:  EKG:    Imaging    No results found.   Medications:     Scheduled Medications: . amiodarone  200 mg Oral Daily  . aspirin EC  81 mg Oral Daily  . atorvastatin  40 mg Oral q1800  . calcitRIOL  0.5 mcg Oral Q M,W,F  . citalopram  20 mg Oral Daily  . dronabinol  2.5 mg Oral BID AC  . erythromycin  250 mg Oral TID  . insulin aspart  0-5  Units Subcutaneous QHS  . insulin aspart  0-9 Units Subcutaneous TID WC  . insulin aspart  3 Units Subcutaneous TID WC  . insulin glargine  8 Units Subcutaneous Daily  . isosorbide-hydrALAZINE  1 tablet Oral 3 times per day on Sun Tue Thu Sat  . metoCLOPramide  10 mg Oral TID  . multivitamin  1 tablet Oral Daily  . pantoprazole  40 mg Oral BID  . sevelamer carbonate  1,600 mg Oral 2 times per day  . sevelamer carbonate  4,000 mg Oral TID WC     Infusions: . sodium chloride    . heparin Stopped (08/09/18 0615)     PRN Medications:  acetaminophen, docusate sodium, hydrALAZINE, lidocaine-prilocaine, LORazepam, ondansetron (ZOFRAN) IV, promethazine   Patient Profile   [name] is a56 y.o.with history of CAD s/p anterior MI in 2010 and NSTEMI in 3/18 with DES to pRCA and mLAD, diabetic, gastroparesis, Chrons  disease, DMIIand ischemic cardiomyopathy, 10/2018s/p Heartmate 3 LVAD (emergent exchange 02/2018 due to outflow graft kink) and ESRD (HD MWF). He has LUE AVR 05/2018.  He is being admitted for peri-op management of 2nd stage of AVF.  Assessment/Plan:    1. Chronic systolic CHF: Ischemic cardiomyopathy. St Jude ICD. NSTEMI in March 2018 with DES to LAD and RCA, complicated by cardiogenic shock, low output requiring milrinone. Echo 8/18 with EF 20-25%, moderate [name]. CPX (8/18) with severe functional impairment due to HF. He is s/p HM3 LVAD placement. He had pump thrombosis 6/19 due to [facility] outflow graft kinking. He had pump exchange for another HM3.  - Volume stable and per HD - Continue Bidil 1 tab TID on non-HD days.  - Continue ASA 81. Warfarin on hold for procedure. INR goal 2-3. Covering with heparin currently. INR pending today.  - He has been referred to [facility] for evaluation for heart/kidney transplant.Scheduled for RHC today for further evaluation.  2. ESRD: Developed peri-operatively after HM3 placement. He is tolerating HD, currently via catheter but has new maturing AV fistula.  - This am underwent US-guided cannulation of LUE AV fistula and found to be sclerotic and not suitable for dialysis. LUE graft created from brachial artery to axillary vein. Coumadin on hold.  - Nephrology following for HD.  3. CAD: NSTEMI in 3/18. LHC with 99% ulcerated lesion proximal RCA with left to right collaterals, 95% mid LAD stenosis after mid LAD stent. s/p PCI to RCA and LAD on 11/25/16.  - No s/s of ischemia.    - Continue statin and ASA. 4. h/o DVTs: Factor V Leiden heterozygote. - On heparin gtt with INR 1.38 08/08/18. INR pending today.  5. Diabetic gastroparesis: Difficult to manage. He has seen gastroparesis specialist at [facility]. Surprisingly, gastric emptying study was normal. However, electrogastrogram showed poor gastric accomodation/capacity. Therefore, small frequent meals  recommended. Also morning nausea thought to be GERD and Protonix was increased. Nausea seems to drive markedly high BP (resolves with nausea control).  - Continue reglan 5 mg tid/ac and Marinol.  - Continue Protonix BID.  - Make effort to eat small, more frequent meals.  -He will continue erythromycin.No change.   6. DM: HgbA1c well-controlled recently. No change.  7. HTN: MAPs in 90s - Hypertension seems to be driven by nausea (see above), so will try to minimize nausea.  8. PAD: Moderate bilateral disease on ABIs in 8/18. Denies claudication currently, no pedal ulcers. No change.  9. Atrial flutter: Paroxysmal. - Tele appears to be in NSR. EKG 08/08/18 with  interference but appears to be NSR.   I reviewed the LVAD parameters from today, and compared the results to the patient's prior recorded data.  No programming changes were made.  The LVAD is functioning within specified parameters.  The patient performs LVAD self-test daily.  LVAD interrogation was negative for any significant power changes, alarms or PI events/speed drops.  LVAD equipment check completed and is in good working order.  Back-up equipment present.   LVAD education done on emergency procedures and precautions and reviewed exit site care.  Length of Stay: 1  [name] 08/09/2018, 10:38 AM  VAD Team --- VAD ISSUES ONLY--- Pager [phone] (7am - 7am)  Advanced Heart Failure Team  Pager [phone] (M-F; 7a - 4p)  Please contact [facility] for night-coverage after hours (4p -7a ) and weekends on [email or web address]  Patent seen with PA, agree with the above note.   Nausea resolved.  He had AV graft placed in the OR today, then had RHC as below.   RHC Procedural Findings: Hemodynamics (mmHg) RA mean 5 RV 34/6 PA 33/9, mean 18 PCWP mean 7 Oxygen saturations: PA 70% AO 100% Cardiac Output (Fick) 6.28  Cardiac Index (Fick) 3.26 PVR 1.75 WU Cardiac Output (Thermo) 5.56 Cardiac Index (Thermo)  2.89 PVR 1.98 WU  As seen on RHC, filling pressures are NOT elevated.  Would favor increasing his dry weight a bit at HD, this will likely lead to less low flow alarms and should be better tolerated.    Resume heparin gtt in 4 hours and continue until INR > 1.8.  Restart warfarin today.   He has now been listed for heart/kidney transplant at [facility].   [name] 08/09/2018 12:44 PM

## 2018-08-09 NOTE — Progress Notes (Signed)
[facility] for heparin Indication: LVAD/Factor 5   Allergies  Allergen Reactions  . Metformin And Related Diarrhea    Patient Measurements: Height: 5' 7"  (170.2 cm) Weight: 177 lb 0.5 oz (80.3 kg) IBW/kg (Calculated) : 66.1 Heparin Dosing Weight: 80.5 kg  Vital Signs: Temp: 98.2 F (36.8 C) (12/05 1020) Temp Source: Oral (12/05 1020) BP: 108/85 (12/05 1020) Pulse Rate: 74 (12/05 1036)  Labs: Recent Labs    08/08/18 1240 08/08/18 1248 08/08/18 2340 08/09/18 0245  HGB  --  10.5*  --  10.0*  HCT  --  32.7*  --  30.9*  PLT  --  PLATELET CLUMPS NOTED ON SMEAR, UNABLE TO ESTIMATE  --  195  LABPROT 16.9*  --   --   --   INR 1.39  --   --   --   HEPARINUNFRC  --   --  0.19*  --   CREATININE  --  3.00*  --  4.46*    Estimated Creatinine Clearance: 19.5 mL/min (A) (by C-G formula based on SCr of 4.46 mg/dL (H)).   Medical History: Past Medical History:  Diagnosis Date  . Angina   . ASCVD (arteriosclerotic cardiovascular disease)    , Anterior infarction 2005, LAD diagonal bifurcation intervention 03/2004  . Automatic implantable cardiac defibrillator -[employer]. Jude's       . Benign neoplasm of colon   . CHF (congestive heart failure) ([place])   . Chronic systolic heart failure ([place])   . Coronary artery disease     Widely patent previously placed stents in the left anterior   . Crohn's disease ([place])   . 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 Dialysis patient ([place])   . Dyspnea   . Gastroparesis   . GERD (gastroesophageal reflux disease)   . High cholesterol   . Hyperlipidemia   . Hypersomnolent    Previous diagnosis of narcolepsy  . Hypertension, essential   . Ischemic cardiomyopathy    Ejection fraction 15-20% catheterization 2010  . Type II or unspecified type diabetes mellitus without mention of complication, not stated as uncontrolled   . Unspecified gastritis and gastroduodenitis without mention of hemorrhage      Medications:  Scheduled:  . amiodarone  200 mg Oral Daily  . aspirin EC  81 mg Oral Daily  . atorvastatin  40 mg Oral q1800  . calcitRIOL  0.5 mcg Oral Q M,W,F  . citalopram  20 mg Oral Daily  . dronabinol  2.5 mg Oral BID AC  . erythromycin  250 mg Oral TID  . insulin aspart  0-5 Units Subcutaneous QHS  . insulin aspart  0-9 Units Subcutaneous TID WC  . insulin aspart  3 Units Subcutaneous TID WC  . insulin glargine  8 Units Subcutaneous Daily  . isosorbide-hydrALAZINE  1 tablet Oral 3 times per day on Sun Tue Thu Sat  . metoCLOPramide  10 mg Oral TID  . multivitamin  1 tablet Oral Daily  . pantoprazole  40 mg Oral BID  . sevelamer carbonate  1,600 mg Oral 2 times per day  . sevelamer carbonate  4,000 mg Oral TID WC    Assessment: 72 yom with hx of Heartmate 3 LVAD and ESRD, plan for LUE AVF placement on 12/5. On warfarin PTA - last dose taken on 12/1.   INR 1.39 on admission. Underwent creation of AVF today on 12/5 followed by cardiac cath today. Plan to start heparin infusion 4  hours after sheath removal  (documented at 12/5 at 1229). No s/sx of bleeding. Hgb 10, plt 195. LDH 196. Okay to resume warfarin tonight.  PTA regimen 5 mg daily except 7.5 mg on Monday.  Goal of Therapy:  Heparin level 0.3-0.7 units/ml Monitor platelets by anticoagulation protocol: Yes   Plan:  Order warfarin 7.5 mg tonight.  Restart heparin infusion at 1300 units/hr at 1630 Check anti-Xa level in 8 hours after restart and daily while on heparin Continue to monitor H&H and platelets  Antonietta Jewel, PharmD, [place] Clinical Pharmacist  Pager: [phone] Phone: [phone] 08/09/2018,11:00 AM

## 2018-08-09 NOTE — Telephone Encounter (Signed)
sch appt spk to pt 08/24/18 2pm Dialysis duplex 245pm p/o MD

## 2018-08-09 NOTE — Progress Notes (Signed)
  Progress Note    08/09/2018 7:23 AM Day of Surgery  Subjective:  No acute events  Vitals:   08/08/18 2334 08/09/18 0349  BP: (!) 78/58 103/83  Pulse: 96 (!) 57  Resp:  17  Temp: 98.4 F (36.9 C) 98.8 F (37.1 C)  SpO2: 97% 98%    Physical Exam: aaox3 Left hand is warm I cannot palpate a thrill in avf on left upper arm  CBC    Component Value Date/Time   WBC 7.8 08/09/2018 0245   RBC 3.53 (L) 08/09/2018 0245   HGB 10.0 (L) 08/09/2018 0245   HGB 11.7 (L) 09/07/2016 1457   HCT 30.9 (L) 08/09/2018 0245   HCT 37.5 09/07/2016 1457   PLT 195 08/09/2018 0245   PLT 279 09/07/2016 1457   MCV 87.5 08/09/2018 0245   MCV 81 09/07/2016 1457   MCH 28.3 08/09/2018 0245   MCHC 32.4 08/09/2018 0245   RDW 14.6 08/09/2018 0245   RDW 14.9 09/07/2016 1457   LYMPHSABS 1.0 08/08/2018 1248   LYMPHSABS 1.3 09/07/2016 1457   MONOABS 0.6 08/08/2018 1248   EOSABS 0.2 08/08/2018 1248   EOSABS 0.2 09/07/2016 1457   BASOSABS 0.0 08/08/2018 1248   BASOSABS 0.0 09/07/2016 1457    BMET    Component Value Date/Time   NA 134 (L) 08/09/2018 0245   NA 144 05/11/2017 0938   K 3.4 (L) 08/09/2018 0245   CL 97 (L) 08/09/2018 0245   CO2 26 08/09/2018 0245   GLUCOSE 189 (H) 08/09/2018 0245   BUN 22 (H) 08/09/2018 0245   BUN 68 (H) 05/11/2017 0938   CREATININE 4.46 (H) 08/09/2018 0245   CREATININE 1.63 (H) 11/09/2016 1505   CALCIUM 8.6 (L) 08/09/2018 0245   CALCIUM 8.7 06/27/2017 1152   GFRNONAA 14 (L) 08/09/2018 0245   GFRNONAA 48 (L) 11/09/2016 1505   GFRAA 16 (L) 08/09/2018 0245   GFRAA 55 (L) 11/09/2016 1505    INR    Component Value Date/Time   INR 1.39 08/08/2018 1240    No intake or output data in the 24 hours ending 08/09/18 4628   Assessment:  52 y.o. male is s/p left 1st stage bvt now without thrill in left upper arm  Plan: OR today for left arm fistulogram and possible left arm 2nd stage bvt vs graft placement  Brandon C. Donzetta Matters, MD [facility] Office: [phone] Pager: [phone]  08/09/2018 7:23 AM

## 2018-08-09 NOTE — Discharge Summary (Addendum)
Advanced Heart Failure Team  Discharge Summary   Patient ID: [name] MRN: [id number], DOB/AGE: Jun 18, 1966 52 y.o. Admit date: 08/08/2018 D/C date:     08/18/2018   Primary Discharge Diagnoses:  1. Chronic systolic heart failure - St Jude ICD - HM3 LVAD - On ASA 81 mg daily and coumadin with INR goal 2-3 2. ESRD - S/p new LUE AVF 12/4 (old graft sclerotic) 3. CAD 4. Hx of DVTs 5. Diabetic gastroparesis 6. DM 7. HTN 8. PAD 9. Paroxysmal atrial flutter 10. Fever and leukocytosis - Treated for HCAP with cefepime and vanc 11. Anemia - Received 1 uPRBC 12/9 12. LUE Hematoma  Hospital Course:  [name] is a70 y.o.with history of CAD s/p anterior MI in 2010 and NSTEMI in 3/18 with DES to pRCA and mLAD, diabetic, gastroparesis, Chrons disease, DMIIand ischemic cardiomyopathy, 10/2018s/p Heartmate 3 LVAD(emergent exchange 02/2018 due to outflow graft kink)and ESRD(HD MWF). He has LUE AVR 05/2018.  He was admitted for peri-op management for 2nd stage of AVF. Coumadin held and heparin bridge started when INR <1.8. He underwent US-guided cannulation of LUE AV fistula and found to be sclerotic and not suitable for dialysis. New LUE graft created from brachial artery to axillary vein. RHC completed as part of heart and kidney transplant work up for [employer]. Course complicated by fevers, requiring IV abx for HCAP and anemia, requiring blood transfusion. Coumadin resumed and bridged with heparin until INR >1.8.  Unfortunately he developed spontaneous LUE hematoma. INR was supra therapeutic so coumadin was held for a few days. VVS followed closely. LUE hematoma gradually improved with reduced edema and pain. VSS recommended conservative with management so coumadin was resumed. Once his pain was controlled he was discharged to home.   1. Chronic systolic CHF: Ischemic cardiomyopathy. St Jude ICD. NSTEMI in March 2018 with DES to LAD and RCA, complicated by cardiogenic shock, low output  requiring milrinone. Echo 8/18 with EF 20-25%, moderate [name]. CPX (8/18) with severe functional impairment due to HF. He is s/p HM3 LVAD placement. He had pump thrombosis 6/19 due to [facility] outflow graft kinking. He had pump exchange for another HM3.RHC showed low filling pressures and good cardiac output this admission. LDH stable.  - Have discussed increasing HD dry weight with [name], this seems to be helping (no low flows with HD this time in the hospital).   - Continue Bidil 1 tabTIDon non-HD days.  - Hold ASA for now with hematoma, restart on Monday (08/20/18) - INR stable 2.35.  Warfarin restarted, goal INR 2-2.5.  Discussed with pharmacy plan to decrease coumadin dosing to 66m daily. He has been instructed to check INR on Monday and call [name] in the coumadin clinic. - [facility] with optimized filling pressures and cardiac output. Normal PA pressure.  2. ESRD: Developed peri-operatively after HM3 placement. He is tolerating HD, currently via catheter but had LUE graft placed this admission.  Hematoma post-AV LUE graft placement.  VVS saw, suspects hematoma.There is question about graft patency. LUE Hematoma gradually improving. Dr. MAundra Dubinhas asked him to elevate. Plan for conservative observation for now. - He can have tramadol at home for left arm pain.  -Coumadin was restarted 12/12. INR 2.35, goal 2-2.5.  Hold ASA until Monday.  - MWF HD - As above, [place] dry [place] => allowing greater dry weight, he had no  PI events/low flows with HD here.   3. CAD: NSTEMI in 3/18. LHC with 99% ulcerated lesion proximal RCA with left to right collaterals, 95% mid LAD stenosis after mid LAD stent. s/p PCI to RCA and LAD on 11/25/16.  - No chest pain.   - Continue statin. 4. h/o DVTs: Factor V Leiden heterozygote.  5. Diabetic gastroparesis: Difficult to manage. He  has seen gastroparesis specialist at [facility]. Surprisingly, gastric emptying study was normal. However, electrogastrogram showed poor gastric accomodation/capacity. Therefore, small frequent meals recommended. Also morning nausea thought to be GERD and Protonix was increased. Nausea seems to drive markedly high BP (resolves with nausea control).  No issues.  Continue current regimen.   - Continue reglan 5 mg tid/ac, chronic erythromycin, and Marinol.  - Continue Protonix BID.   - Phenergan prn.  - Make effort to eat small, more frequent meals.  6. DM: HgbA1c well-controlled recently.No change.   7. HTN: Stable.  Continue hydralazine today (non-HD day).  Hypertension seems to be driven by nausea (see above), so will try to minimize nausea.  8. PAD: Moderate bilateral disease on ABIs in 8/18. Denies claudication currently, no pedal ulcers.No change.   9. Atrial flutter: Paroxysmal. Remains in NSR.    10. Fever: Initially, now afebrile. Blood Cultures negative. CXR with possible left lingular infiltrate versus atelectasis.  Driveline site ok. He completed course of vancomycin/cefepime for possible HCAP.  11. Anemia: Received 1PURBc 12/9. Hgb stable 8.2>8.1>8.6. Suspect fall was due to left arm hematoma, LUE arm hematoma now improving.  12. Disposition: Home today.  Will need followup early next week in LVAD clinic and with VVS to reassess left arm.  Only med changes compared to prior to admission will be to hold ASA 81 daily until Monday (restart Monday) and would send home with tramadol 50 mg q6 hrs prn.  He will continue warfarin goal INR 2-2.5, should be checked Monday or Tuesday at [place] clinic. I discussed coumadin dosing with [name] pharmD and plan will be to encourage good PO intake at home (including Ensure) and decrease coumadin to 3m daily (previously took 7.565m2x a week).   [name] the LVAD parameters from today, and compared the results to the patient's prior  recorded data.  No programming changes were made.  The LVAD is functioning within specified parameters.  The patient performs LVAD self-test daily.  LVAD interrogation was negative for any significant power changes, alarms or PI events/speed drops.  LVAD equipment check completed and is in good working order.  Back-up equipment present. LVAD education done on emergency procedures and precautions and reviewed exit site care.   LVAD Interrogation HM 3: Speed: 5500 Flow: 3.9  PI: 4.4 Power: 5.5.  No PI events.   Discharge Weight:  Last Weight  Most recent update: 08/18/2018  6:16 AM   Weight  81.5 kg (179 lb 10.8 oz)            Discharge Vitals: Blood pressure (!) 122/93, pulse 83, temperature 98 F (36.7 C), temperature source Oral, resp. rate (!) 22, height 5' 7"  (1.702 m), weight 81.5 kg, SpO2 100 %.  Labs: Lab Results  Component Value Date   WBC 12.4 (H) 08/18/2018   HGB 8.6 (L) 08/18/2018   HCT 27.6 (L) 08/18/2018   MCV 89.9 08/18/2018   PLT 289 08/18/2018    Recent Labs  Lab 08/18/18 0252  NA 139  K 4.4  CL 99  CO2 31  BUN 16  CREATININE 3.70*  CALCIUM 8.4*  GLUCOSE 145*   Lab Results  Component Value Date   CHOL 128 05/04/2017   HDL 50 05/04/2017   LDLCALC 54 05/04/2017   TRIG 118 05/04/2017   BNP (last 3 results) Recent Labs    03/02/18 0400 03/08/18 0008 03/15/18 0106  BNP 694.1* 1,576.6* 811.3*    ProBNP (last 3 results) No results for input(s): PROBNP in the last 8760 hours.   Diagnostic Studies/Procedures   No results found.  Discharge Medications   Allergies as of 08/18/2018      Reactions   Metformin And Related Diarrhea      Medication List    STOP taking these medications   enoxaparin 80 MG/0.8ML injection Commonly known as:  LOVENOX     TAKE these medications   amiodarone 200 MG tablet Commonly known as:  PACERONE Take 200 mg by mouth daily.   aspirin EC 81 MG tablet Take 1 tablet (81 mg total) by mouth daily. Start  taking on:  August 20, 2018 What changed:  These instructions start on August 20, 2018. If you are unsure what to do until then, ask your doctor or other care provider.   atorvastatin 40 MG tablet Commonly known as:  LIPITOR Take 1 tablet (40 mg total) by mouth daily at 6 PM.   calcitRIOL 0.5 MCG capsule Commonly known as:  ROCALTROL Take 1 capsule (0.5 mcg total) by mouth every Monday, Wednesday, and Friday.   citalopram 20 MG tablet Commonly known as:  CELEXA Take 1 tablet (20 mg total) by mouth daily.   docusate sodium 100 MG capsule Commonly known as:  COLACE Take 100 mg by mouth daily as needed for mild constipation.   dronabinol 2.5 MG capsule Commonly known as:  MARINOL Take 1 capsule (2.5 mg total) by mouth 2 (two) times daily before lunch and supper.   erythromycin 250 MG tablet Commonly known as:  E-MYCIN Take 1 tablet (250 mg total) by mouth 3 (three) times daily.   insulin aspart 100 UNIT/ML injection Commonly known as:  novoLOG Inject 5 Units into the skin 3 (three) times daily with meals.   insulin glargine 100 UNIT/ML injection Commonly known as:  LANTUS Inject 0.1 mLs (10 Units total) into the skin daily.   isosorbide-hydrALAZINE 20-37.5 MG tablet Commonly known as:  BIDIL Take 1.5 tablets by mouth every 8 (eight) hours. Take 1.5 tablets every 8 hours on non dialysis days. What changed:    how much to take  when to take this  additional instructions   lidocaine-prilocaine cream Commonly known as:  EMLA Apply 1 application topically as needed (to numb port).   LORazepam 0.5 MG tablet Commonly known as:  ATIVAN Take 1 tablet (0.5 mg total) by mouth every 12 (twelve) hours as needed (nausea).   metoCLOPramide 10 MG tablet Commonly known as:  REGLAN Take 1 tablet (10 mg total) by mouth 3 (three) times daily.   multivitamin Tabs tablet Take 1 tablet by mouth daily.   pantoprazole 40 MG tablet Commonly known as:  PROTONIX Take 40 mg by  mouth 2 (two) times daily.   promethazine 12.5 MG tablet Commonly known as:  PHENERGAN Take 1 tablet (12.5 mg total) by mouth every 6 (six) hours as needed for nausea or vomiting.   sevelamer carbonate 800 MG tablet Commonly known as:  RENVELA Take 1,600-4,000 mg by mouth See admin instructions. 4082m three times daily with meals and 16068mtwice daily with snacks   traMADol  50 MG tablet Commonly known as:  ULTRAM Take 1 tablet (50 mg total) by mouth every 6 (six) hours as needed for moderate pain.   warfarin 5 MG tablet Commonly known as:  COUMADIN Take as directed. If you are unsure how to take this medication, talk to your nurse or doctor. Original instructions:  Take 1 tablet (5 mg total) by mouth See admin instructions. Take 5 mg (1 tablet) daily What changed:    how much to take  additional instructions       Disposition   The patient will be discharged in stable condition to home.  Follow-up Information    Waynetta Sandy, MD In 2 weeks.   Specialties:  Vascular Surgery, Cardiology Why:  Office will call you to arrange your appt (sent) Contact information: [place] [address] [phone]        [facility] Follow up on 08/21/2018.   Specialty:  Cardiology Why:  at 1000 for post hospital follow up. Code for parking is 1900 Contact information: [address] [id number] [place] [phone]            Duration of Discharge Encounter: Greater than 35 minutes   Signed, Angelena Form, PA-C  08/18/2018, 9:57 AM

## 2018-08-10 ENCOUNTER — Encounter (HOSPITAL_COMMUNITY): Payer: Self-pay

## 2018-08-10 ENCOUNTER — Inpatient Hospital Stay (HOSPITAL_COMMUNITY): Payer: BLUE CROSS/BLUE SHIELD

## 2018-08-10 ENCOUNTER — Encounter (HOSPITAL_COMMUNITY): Payer: Self-pay | Admitting: Vascular Surgery

## 2018-08-10 ENCOUNTER — Ambulatory Visit (HOSPITAL_COMMUNITY): Admit: 2018-08-10 | Payer: BLUE CROSS/BLUE SHIELD | Admitting: Cardiology

## 2018-08-10 DIAGNOSIS — Z992 Dependence on renal dialysis: Secondary | ICD-10-CM

## 2018-08-10 LAB — PROTIME-INR
INR: 1.33
Prothrombin Time: 16.4 seconds — ABNORMAL HIGH (ref 11.4–15.2)

## 2018-08-10 LAB — GLUCOSE, CAPILLARY
Glucose-Capillary: 111 mg/dL — ABNORMAL HIGH (ref 70–99)
Glucose-Capillary: 135 mg/dL — ABNORMAL HIGH (ref 70–99)
Glucose-Capillary: 77 mg/dL (ref 70–99)
Glucose-Capillary: 170 mg/dL — ABNORMAL HIGH (ref 70–99)

## 2018-08-10 LAB — CBC
HEMATOCRIT: 28.6 % — AB (ref 39.0–52.0)
Hemoglobin: 9.1 g/dL — ABNORMAL LOW (ref 13.0–17.0)
MCH: 28 pg (ref 26.0–34.0)
MCHC: 31.8 g/dL (ref 30.0–36.0)
MCV: 88 fL (ref 80.0–100.0)
Platelets: 206 10*3/uL (ref 150–400)
RBC: 3.25 MIL/uL — ABNORMAL LOW (ref 4.22–5.81)
RDW: 14.6 % (ref 11.5–15.5)
WBC: 8.3 10*3/uL (ref 4.0–10.5)
nRBC: 0 % (ref 0.0–0.2)

## 2018-08-10 LAB — BASIC METABOLIC PANEL
Anion gap: 13 (ref 5–15)
BUN: 32 mg/dL — ABNORMAL HIGH (ref 6–20)
CO2: 24 mmol/L (ref 22–32)
Calcium: 8 mg/dL — ABNORMAL LOW (ref 8.9–10.3)
Chloride: 97 mmol/L — ABNORMAL LOW (ref 98–111)
Creatinine, Ser: 7.74 mg/dL — ABNORMAL HIGH (ref 0.61–1.24)
GFR calc non Af Amer: 7 mL/min — ABNORMAL LOW (ref >60)
GFR, EST AFRICAN AMERICAN: 8 mL/min — AB (ref >60)
Glucose, Bld: 191 mg/dL — ABNORMAL HIGH (ref 70–99)
Potassium: 3.9 mmol/L (ref 3.5–5.1)
Sodium: 134 mmol/L — ABNORMAL LOW (ref 135–145)

## 2018-08-10 LAB — MRSA PCR SCREENING: MRSA by PCR: NEGATIVE

## 2018-08-10 LAB — LACTATE DEHYDROGENASE: LDH: 175 U/L (ref 98–192)

## 2018-08-10 LAB — HEPARIN LEVEL (UNFRACTIONATED)
HEPARIN UNFRACTIONATED: 0.21 [IU]/mL — AB (ref 0.30–0.70)
Heparin Unfractionated: 0.23 IU/mL — ABNORMAL LOW (ref 0.30–0.70)

## 2018-08-10 SURGERY — RIGHT HEART CATH
Anesthesia: LOCAL

## 2018-08-10 MED ORDER — ONDANSETRON HCL 4 MG/2ML IJ SOLN
INTRAMUSCULAR | Status: AC
  Filled 2018-08-10: qty 2

## 2018-08-10 MED ORDER — WARFARIN SODIUM 7.5 MG PO TABS
7.5000 mg | ORAL_TABLET | Freq: Once | ORAL | Status: AC
  Administered 2018-08-10: 7.5 mg via ORAL
  Filled 2018-08-10: qty 1

## 2018-08-10 MED ORDER — HEPARIN SODIUM (PORCINE) 1000 UNIT/ML IJ SOLN
INTRAMUSCULAR | Status: AC
  Filled 2018-08-10: qty 1

## 2018-08-10 MED ORDER — DARBEPOETIN ALFA 40 MCG/0.4ML IJ SOSY
PREFILLED_SYRINGE | INTRAMUSCULAR | Status: AC
  Administered 2018-08-10: 40 ug via INTRAVENOUS
  Filled 2018-08-10: qty 0.4

## 2018-08-10 MED ORDER — TRAMADOL HCL 50 MG PO TABS
50.0000 mg | ORAL_TABLET | Freq: Four times a day (QID) | ORAL | Status: DC
  Administered 2018-08-10 – 2018-08-13 (×12): 50 mg via ORAL
  Filled 2018-08-10 (×12): qty 1

## 2018-08-10 NOTE — Care Management Note (Signed)
Case Management Note  Patient Details  Name: [name] MRN: [id number] Date of Birth: [birth year]  Subjective/Objective:   Pt admitted for HD fistula repair - hx of LVAD with multiple admits related to LVAD complications              Action/Plan:   PTA independent from home on outpt HD.  Pt has PCP and active insurance   Expected Discharge Date:                  Expected Discharge Plan:  Home/Self Care(Independent from home, has PCP)  In-House Referral:     Discharge planning Services     Post Acute Care Choice:    Choice offered to:     DME Arranged:    DME Agency:     HH Arranged:    HH Agency:     Status of Service:     If discussed at [employer], dates discussed:    Additional Comments:  Maryclare Labrador, RN 08/10/2018, 8:35 AM

## 2018-08-10 NOTE — Plan of Care (Signed)
  Problem: Cardiac: Goal: LVAD will function as expected and patient will experience no clinical alarms Outcome: Progressing   Problem: Education: Goal: Patient will be able to verbalize current INR target range and antiplatelet therapy for discharge home Outcome: Progressing   Problem: Clinical Measurements: Goal: Ability to maintain clinical measurements within normal limits will improve Outcome: Progressing Goal: Will remain free from infection Outcome: Progressing Goal: Diagnostic test results will improve Outcome: Progressing Goal: Cardiovascular complication will be avoided Outcome: Progressing   Problem: Activity: Goal: Risk for activity intolerance will decrease Outcome: Progressing   Problem: Nutrition: Goal: Adequate nutrition will be maintained Outcome: Progressing   Problem: Elimination: Goal: Will not experience complications related to urinary retention Outcome: Progressing   Problem: Pain Managment: Goal: General experience of comfort will improve Outcome: Progressing   Problem: Safety: Goal: Ability to remain free from injury will improve Outcome: Progressing   Problem: Skin Integrity: Goal: Risk for impaired skin integrity will decrease Outcome: Progressing

## 2018-08-10 NOTE — Progress Notes (Signed)
  Progress Note    08/10/2018 8:52 AM 1 Day Post-Op  Subjective: Having left arm pain and swelling  Vitals:   08/10/18 0309 08/10/18 0746  BP: (!) 86/52 (!) 84/75  Pulse: (!) 113 86  Resp: (!) 22 (!) 22  Temp: (!) 101.9 F (38.8 C) 98.8 F (37.1 C)  SpO2: 97% 100%    Physical Exam: Awake alert oriented Left upper extremity with mild edema Incision clean dry intact There is signal at the radial artery at the wrist and I can send signal in the axillary vein runoff as well as mild signal within the graft itself but I cannot feel a thrill  CBC    Component Value Date/Time   WBC 8.3 08/10/2018 0148   RBC 3.25 (L) 08/10/2018 0148   HGB 9.1 (L) 08/10/2018 0148   HGB 11.7 (L) 09/07/2016 1457   HCT 28.6 (L) 08/10/2018 0148   HCT 37.5 09/07/2016 1457   PLT 206 08/10/2018 0148   PLT 279 09/07/2016 1457   MCV 88.0 08/10/2018 0148   MCV 81 09/07/2016 1457   MCH 28.0 08/10/2018 0148   MCHC 31.8 08/10/2018 0148   RDW 14.6 08/10/2018 0148   RDW 14.9 09/07/2016 1457   LYMPHSABS 1.0 08/08/2018 1248   LYMPHSABS 1.3 09/07/2016 1457   MONOABS 0.6 08/08/2018 1248   EOSABS 0.2 08/08/2018 1248   EOSABS 0.2 09/07/2016 1457   BASOSABS 0.0 08/08/2018 1248   BASOSABS 0.0 09/07/2016 1457    BMET    Component Value Date/Time   NA 134 (L) 08/10/2018 0148   NA 144 05/11/2017 0938   K 3.9 08/10/2018 0148   CL 97 (L) 08/10/2018 0148   CO2 24 08/10/2018 0148   GLUCOSE 191 (H) 08/10/2018 0148   BUN 32 (H) 08/10/2018 0148   BUN 68 (H) 05/11/2017 0938   CREATININE 7.74 (H) 08/10/2018 0148   CREATININE 1.63 (H) 11/09/2016 1505   CALCIUM 8.0 (L) 08/10/2018 0148   CALCIUM 8.7 06/27/2017 1152   GFRNONAA 7 (L) 08/10/2018 0148   GFRNONAA 48 (L) 11/09/2016 1505   GFRAA 8 (L) 08/10/2018 0148   GFRAA 55 (L) 11/09/2016 1505    INR    Component Value Date/Time   INR 1.33 08/10/2018 0148     Intake/Output Summary (Last 24 hours) at 08/10/2018 0852 Last data filed at 08/09/2018  1602 Gross per 24 hour  Intake 930.67 ml  Output 10 ml  Net 920.67 ml     Assessment:  52 y.o. male is s/p attempted left upper extremity second stage basilic vein fistula creation however the vein had not matured.  He then had a graft placed from the left brachial artery to the axillary vein yesterday.  There is no thrill but there does appear to be signal in the runoff vein consistent with presence of LVAD  Plan: Assuming the graft continues to work it will be ready for use in 3 to 4 weeks.  If this graft fails I am unsure how to move forward given that fistula did not mature and grafts will be very difficult to have patency without pulsatile flow.  If there are further issues we will see him back in our office to discuss options.   Joseline Mccampbell C. Donzetta Matters, MD [facility] [place] Office: [phone] Pager: [phone]  08/10/2018 8:52 AM

## 2018-08-10 NOTE — Progress Notes (Addendum)
Advanced Heart Failure VAD Team Note  PCP-Cardiologist: No primary care provider on file.   Subjective:    08/09/18 underwent US-guided cannulation of LUE AV fistula and found to be sclerotic and not suitable for dialysis. LUE graft created from brachial artery to axillary vein. Coumadin resumed. Heparin gtt bridge. INR 1.33 this am.   Tmax 101.9 (one time spike) Tachycardic overnight into 110s with fever, but NSR in 80s this am. Feeling OK. Denies cough, driveline drainage, N/V, or SOB.  For HD today.   LVAD Interrogation HM 3: Speed: 5500 Flow: 4.2 PI: 3.5 Power: 4.0. 12 PI events x 24 hrs.  [place] 08/09/18  RA mean 5 RV 34/6 PA 33/9, mean 18 PCWP mean 7 Oxygen saturations: PA 70% AO 100% Cardiac Output (Fick) 6.28  Cardiac Index (Fick) 3.26 PVR 1.75 WU Cardiac Output (Thermo) 5.56 Cardiac Index (Thermo) 2.89 PVR 1.98 WU  Objective:    Vital Signs:   Temp:  [98.1 F (36.7 C)-101.9 F (38.8 C)] 101.9 F (38.8 C) (12/06 0309) Pulse Rate:  [33-114] 113 (12/06 0309) Resp:  [0-25] 22 (12/06 0309) BP: (63-128)/(50-101) 86/52 (12/06 0309) SpO2:  [95 %-100 %] 97 % (12/06 0309) Weight:  [82.3 kg] 82.3 kg (12/06 0651) Last BM Date: 08/08/18 Mean arterial Pressure 70-80s overnight.   Intake/Output:   Intake/Output Summary (Last 24 hours) at 08/10/2018 0740 Last data filed at 08/09/2018 1602 Gross per 24 hour  Intake 930.67 ml  Output 10 ml  Net 920.67 ml     Physical Exam    General: NAD.  HEENT: Normal. Neck: Supple, JVP 6-7 cm. Carotids OK.  Cardiac:  Mechanical heart sounds with LVAD hum present. Left chest HD cath. Lungs:  CTAB, normal effort.  Abdomen:  NT, ND, no HSM. No bruits or masses. +BS  LVAD exit site: Dressing dry and intact. Small amount of moisture noted through techaderm. No erythema or drainage. Stabilization device present and accurately applied. Driveline dressing changed daily per sterile technique. Extremities:  Warm and dry. No cyanosis,  clubbing, rash, or edema.  Neuro:  Alert & oriented x 3. Cranial nerves grossly intact. Moves all 4 extremities w/o difficulty. Affect pleasant     Telemetry   NSR 80s this am, tachy into 110s overnight with fever.   EKG    No new tracings.    Labs   Basic Metabolic Panel: Recent Labs  Lab 08/03/18 1216 08/08/18 1248 08/09/18 0245 08/10/18 0148  NA 135 134* 134* 134*  K 3.5 3.2* 3.4* 3.9  CL 99 97* 97* 97*  CO2  --  23 26 24   GLUCOSE 95 140* 189* 191*  BUN 32* 13 22* 32*  CREATININE 5.10* 3.00* 4.46* 7.74*  CALCIUM  --  9.1 8.6* 8.0*  MG  --  1.6* 2.8*  --     Liver Function Tests: Recent Labs  Lab 08/08/18 1248  AST 18  ALT 19  ALKPHOS 87  BILITOT 0.6  PROT 6.8  ALBUMIN 3.2*   No results for input(s): LIPASE, AMYLASE in the last 168 hours. No results for input(s): AMMONIA in the last 168 hours.  CBC: Recent Labs  Lab 08/03/18 1216 08/08/18 1248 08/09/18 0245 08/10/18 0148  WBC  --  8.2 7.8 8.3  NEUTROABS  --  6.4  --   --   HGB 12.6* 10.5* 10.0* 9.1*  HCT 37.0* 32.7* 30.9* 28.6*  MCV  --  86.7 87.5 88.0  PLT  --  PLATELET CLUMPS NOTED ON SMEAR, UNABLE TO  ESTIMATE 195 206    INR: Recent Labs  Lab 08/06/18 08/08/18 1240 08/09/18 1050 08/10/18 0148  INR 2.8 1.39 1.10 1.33    Other results:  EKG:    Imaging   No results found.   Medications:     Scheduled Medications: . amiodarone  200 mg Oral Daily  . aspirin EC  81 mg Oral Daily  . atorvastatin  40 mg Oral q1800  . calcitRIOL  0.5 mcg Oral Q M,W,F  . Chlorhexidine Gluconate Cloth  6 each Topical Q0600  . citalopram  20 mg Oral Daily  . darbepoetin (ARANESP) injection - DIALYSIS  40 mcg Intravenous Q Fri-HD  . dronabinol  2.5 mg Oral BID AC  . erythromycin  250 mg Oral TID  . insulin aspart  0-5 Units Subcutaneous QHS  . insulin aspart  0-9 Units Subcutaneous TID WC  . insulin aspart  3 Units Subcutaneous TID WC  . insulin glargine  8 Units Subcutaneous Daily  .  isosorbide-hydrALAZINE  1 tablet Oral 3 times per day on Sun Tue Thu Sat  . metoCLOPramide  10 mg Oral TID  . multivitamin  1 tablet Oral Daily  . pantoprazole  40 mg Oral BID  . sevelamer carbonate  1,600 mg Oral 2 times per day  . sevelamer carbonate  4,000 mg Oral TID WC  . sodium chloride flush  3 mL Intravenous Q12H  . Warfarin - Pharmacist Dosing Inpatient   Does not apply q1800    Infusions: . sodium chloride    . sodium chloride    . heparin 1,400 Units/hr (08/10/18 0330)    PRN Medications: sodium chloride, acetaminophen, docusate sodium, hydrALAZINE, lidocaine-prilocaine, LORazepam, ondansetron (ZOFRAN) IV, promethazine, sodium chloride flush   Patient Profile   Mr Sunde is a51 y.o.with history of CAD s/p anterior MI in 2010 and NSTEMI in 3/18 with DES to pRCA and mLAD, diabetic, gastroparesis, Chrons disease, DMIIand ischemic cardiomyopathy, 10/2018s/p Heartmate 3 LVAD (emergent exchange 02/2018 due to outflow graft kink) and ESRD (HD MWF). He has LUE AVR 05/2018.  He is being admitted for peri-op management of 2nd stage of AVF.  Assessment/Plan:    1. Chronic systolic CHF: Ischemic cardiomyopathy. St Jude ICD. NSTEMI in March 2018 with DES to LAD and RCA, complicated by cardiogenic shock, low output requiring milrinone. Echo 8/18 with EF 20-25%, moderate MR. CPX (8/18) with severe functional impairment due to HF. He is s/p HM3 LVAD placement. He had pump thrombosis 6/19 due to [facility] outflow graft kinking. He had pump exchange for another HM3.  - Volume stable to dry and per HD. We have discussed with Dr. Posey Pronto that we recommend increasing dry weight.  - Continue Bidil 1 tab TID on non-HD days.  - Continue ASA 81. Warfarin on hold for procedure. INR goal 2-3. Covering with heparin currently. INR pending today.  - He is now listed for heart/kidney at [facility]. [place] 08/09/18 with optimized filling pressures and cardiac output. Normal PA pressure.  2. ESRD: Developed  peri-operatively after HM3 placement. He is tolerating HD, currently via catheter but has new maturing AV fistula.  - 08/09/18 underwent US-guided cannulation of LUE AV fistula and found to be sclerotic and not suitable for dialysis. LUE graft created from brachial artery to axillary vein. Coumadin on hold.  - Nephrology following for HD today.  3. CAD: NSTEMI in 3/18. LHC with 99% ulcerated lesion proximal RCA with left to right collaterals, 95% mid LAD stenosis after mid LAD stent. s/p PCI  to RCA and LAD on 11/25/16.  - No s/s of ischemia.    - Continue statin and ASA. 4. h/o DVTs: Factor V Leiden heterozygote. - On heparin gtt with INR 1.33 today. Continue until INR > 1.8.  5. Diabetic gastroparesis: Difficult to manage. He has seen gastroparesis specialist at [facility]. Surprisingly, gastric emptying study was normal. However, electrogastrogram showed poor gastric accomodation/capacity. Therefore, small frequent meals recommended. Also morning nausea thought to be GERD and Protonix was increased. Nausea seems to drive markedly high BP (resolves with nausea control).  - Stable. Seems to be driven by low volume status, and when nauseated, increase BP. Have suggested increased dry weight for HD.  - Continue reglan 5 mg tid/ac and Marinol.  - Continue Protonix BID.  - Make effort to eat small, more frequent meals.  -He will continue erythromycin.No change.   6. DM: HgbA1c well-controlled recently. No change.   7. HTN:  - MAPs 70-80s overnight. Overall in 90s for the most part.  - Hypertension seems to be driven by nausea (see above), so will try to minimize nausea.  8. PAD: Moderate bilateral disease on ABIs in 8/18. Denies claudication currently, no pedal ulcers. No change.   9. Atrial flutter: Paroxysmal. - Tachycardia overnight appears to be sinus tach. - EKG 08/08/18 with interference but appears to be NSR.  10. Fever - Tmax to 101.9. Denies Cough, chills, or driveline drainage.    - Check BCx, CXR, and UA.  - Will have VAD Coordinator assess driveline site. (last changed Wednesday, and typically weekly changes)  I reviewed the LVAD parameters from today, and compared the results to the patient's prior recorded data.  No programming changes were made.  The LVAD is functioning within specified parameters.  The patient performs LVAD self-test daily.  LVAD interrogation was negative for any significant power changes, alarms or PI events/speed drops.  LVAD equipment check completed and is in good working order.  Back-up equipment present.   LVAD education done on emergency procedures and precautions and reviewed exit site care.   Length of Stay: 2  Annamaria Helling 08/10/2018, 7:40 AM  VAD Team --- VAD ISSUES ONLY--- Pager [phone] (7am - 7am)  Advanced Heart Failure Team  Pager [phone] (M-F; 7a - 4p)  Please contact [facility] for night-coverage after hours (4p -7a ) and weekends on [email or web address]  Patient seen with PA, agree with the above note.    Mr Sevin had a fever early this morning with no localizing symptoms.  Driveline looks ok, no cough, CXR without definite PNA. No further fever today and WBCs not elevated.  - Continue to monitor - blood cultures sent.   No nausea/vomiting today.   INR 1.33, he remains on heparin gtt + warfarin overlap until INR > 1.8.   Getting HD currently, discussed with Dr. Posey Pronto regarding increasing dry weight.   Terran Klinke 08/10/2018 4:42 PM

## 2018-08-10 NOTE — Procedures (Signed)
Patient seen on Hemodialysis. BP 102/74   Pulse 85   Temp 98 F (36.7 C) (Oral)   Resp (!) 21   Ht 5' 7"  (1.702 m)   Wt 82.3 kg   SpO2 100%   BMI 28.42 kg/m   QB 300, UF goal 2L Tolerating treatment without complaints at this time.   Elmarie Shiley MD [facility]. Office # [phone] Pager # [phone] 1:56 PM

## 2018-08-10 NOTE — Progress Notes (Signed)
Patient ID: [name], male   DOB: Jan 12, 1966, 52 y.o.   MRN: [id number]  [facility] Progress Note   Assessment/ Plan:   1. Status post left upper arm AV graft: Poor vein maturation to allow for fistula-currently he has a audible bruit/doppler signal in the graft indicating patency.  Discussed with Dr. Donzetta Matters with recommendations to await 3 weeks for graft incorporation prior to attempting cannulation. 2. ESRD: Continue hemodialysis on a Monday/Wednesday/Friday schedule, cautious ultrafiltration given low flow alarm with LVAD. 3. Anemia: Hemoglobin level slightly lower, possibly surgically mediated losses, continue to follow on ESA. 4. CKD-MBD: Continue renal diet, phosphorus binders. 5. Nutrition: Continue renal diet, renal vitamin, minus 6.  Fever/tachycardia: Unclear source, will continue to monitor fever curve/CBC to decide on need for additional investigation including blood cultures.  Subjective:   Reports surgical site pain over left upper arm AVG placement.  Denies any chest pain or shortness of breath.   Objective:   BP (!) 86/52 (BP Location: Right Arm)   Pulse (!) 113   Temp (!) 101.9 F (38.8 C) (Oral)   Resp (!) 22   Ht 5' 7"  (1.702 m)   Wt 82.3 kg   SpO2 97%   BMI 28.42 kg/m   Physical Exam: Gen: Comfortably resting in bed, oriented x3 CVS: Pulse regular tachycardia, precordial hum audible Resp: Anteriorly clear to auscultation, no rales/rhonchi Abd: Soft, flat, nontender Ext: Without lower extremity edema, left upper arm AVG with faint audible bruit without thrill  Labs: BMET Recent Labs  Lab 08/03/18 1216 08/08/18 1248 08/09/18 0245 08/10/18 0148  NA 135 134* 134* 134*  K 3.5 3.2* 3.4* 3.9  CL 99 97* 97* 97*  CO2  --  23 26 24   GLUCOSE 95 140* 189* 191*  BUN 32* 13 22* 32*  CREATININE 5.10* 3.00* 4.46* 7.74*  CALCIUM  --  9.1 8.6* 8.0*   CBC Recent Labs  Lab 08/03/18 1216 08/08/18 1248 08/09/18 0245 08/10/18 0148  WBC  --   8.2 7.8 8.3  NEUTROABS  --  6.4  --   --   HGB 12.6* 10.5* 10.0* 9.1*  HCT 37.0* 32.7* 30.9* 28.6*  MCV  --  86.7 87.5 88.0  PLT  --  PLATELET CLUMPS NOTED ON SMEAR, UNABLE TO ESTIMATE 195 206    @IMGRELPRIORS @ Medications:    . amiodarone  200 mg Oral Daily  . aspirin EC  81 mg Oral Daily  . atorvastatin  40 mg Oral q1800  . calcitRIOL  0.5 mcg Oral Q M,W,F  . Chlorhexidine Gluconate Cloth  6 each Topical Q0600  . citalopram  20 mg Oral Daily  . darbepoetin (ARANESP) injection - DIALYSIS  40 mcg Intravenous Q Fri-HD  . dronabinol  2.5 mg Oral BID AC  . erythromycin  250 mg Oral TID  . insulin aspart  0-5 Units Subcutaneous QHS  . insulin aspart  0-9 Units Subcutaneous TID WC  . insulin aspart  3 Units Subcutaneous TID WC  . insulin glargine  8 Units Subcutaneous Daily  . isosorbide-hydrALAZINE  1 tablet Oral 3 times per day on Sun Tue Thu Sat  . metoCLOPramide  10 mg Oral TID  . multivitamin  1 tablet Oral Daily  . pantoprazole  40 mg Oral BID  . sevelamer carbonate  1,600 mg Oral 2 times per day  . sevelamer carbonate  4,000 mg Oral TID WC  . sodium chloride flush  3 mL Intravenous Q12H  . Warfarin - Pharmacist  Dosing Inpatient   Does not apply q1800   Elmarie Shiley, MD 08/10/2018, 7:44 AM

## 2018-08-10 NOTE — Progress Notes (Signed)
[place] for Heparin Indication: LVAD/Factor 5   Allergies  Allergen Reactions  . Metformin And Related Diarrhea    Patient Measurements: Height: 5' 7"  (170.2 cm) Weight: 177 lb 0.5 oz (80.3 kg) IBW/kg (Calculated) : 66.1 Heparin Dosing Weight: 80.5 kg  Vital Signs: Temp: 101.9 F (38.8 C) (12/06 0309) Temp Source: Oral (12/06 0309) BP: 86/52 (12/06 0309) Pulse Rate: 113 (12/06 0309)  Labs: Recent Labs    08/08/18 1240  08/08/18 1248 08/08/18 2340 08/09/18 0245 08/09/18 1050 08/10/18 0148  HGB  --    < > 10.5*  --  10.0*  --  9.1*  HCT  --   --  32.7*  --  30.9*  --  28.6*  PLT  --   --  PLATELET CLUMPS NOTED ON SMEAR, UNABLE TO ESTIMATE  --  195  --  206  LABPROT 16.9*  --   --   --   --  14.2 16.4*  INR 1.39  --   --   --   --  1.10 1.33  HEPARINUNFRC  --   --   --  0.19*  --  <0.10* 0.23*  CREATININE  --   --  3.00*  --  4.46*  --  7.74*   < > = values in this interval not displayed.    Estimated Creatinine Clearance: 11.2 mL/min (A) (by C-G formula based on SCr of 7.74 mg/dL (H)).   Medical History: Past Medical History:  Diagnosis Date  . Angina   . ASCVD (arteriosclerotic cardiovascular disease)    , Anterior infarction 2005, LAD diagonal bifurcation intervention 03/2004  . Automatic implantable cardiac defibrillator -St. Jude's       . Benign neoplasm of colon   . CHF (congestive heart failure) ([place])   . Chronic systolic heart failure ([place])   . Coronary artery disease     Widely patent previously placed stents in the left anterior   . Crohn's disease ([place])   . 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 Dialysis patient ([place])   . Dyspnea   . Gastroparesis   . GERD (gastroesophageal reflux disease)   . High cholesterol   . Hyperlipidemia   . Hypersomnolent    Previous diagnosis of narcolepsy  . Hypertension, essential   . Ischemic cardiomyopathy    Ejection fraction 15-20% catheterization  2010  . Type II or unspecified type diabetes mellitus without mention of complication, not stated as uncontrolled   . Unspecified gastritis and gastroduodenitis without mention of hemorrhage     Medications:  Scheduled:  . amiodarone  200 mg Oral Daily  . aspirin EC  81 mg Oral Daily  . atorvastatin  40 mg Oral q1800  . calcitRIOL  0.5 mcg Oral Q M,W,F  . Chlorhexidine Gluconate Cloth  6 each Topical Q0600  . citalopram  20 mg Oral Daily  . darbepoetin (ARANESP) injection - DIALYSIS  40 mcg Intravenous Q Fri-HD  . dronabinol  2.5 mg Oral BID AC  . erythromycin  250 mg Oral TID  . insulin aspart  0-5 Units Subcutaneous QHS  . insulin aspart  0-9 Units Subcutaneous TID WC  . insulin aspart  3 Units Subcutaneous TID WC  . insulin glargine  8 Units Subcutaneous Daily  . isosorbide-hydrALAZINE  1 tablet Oral 3 times per day on Sun Tue Thu Sat  . metoCLOPramide  10 mg Oral TID  . multivitamin  1 tablet Oral Daily  .  pantoprazole  40 mg Oral BID  . sevelamer carbonate  1,600 mg Oral 2 times per day  . sevelamer carbonate  4,000 mg Oral TID WC  . sodium chloride flush  3 mL Intravenous Q12H  . Warfarin - Pharmacist Dosing Inpatient   Does not apply q1800    Assessment: 19 yom with hx of Heartmate 3 LVAD and ESRD, plan for LUE AVF placement on 12/5. On warfarin PTA - last dose taken on 12/1.   INR 1.39 on admission. Underwent creation of AVF today on 12/5 followed by cardiac cath today. Plan to start heparin infusion 4 hours after sheath removal  (documented at 12/5 at 1229). No s/sx of bleeding. Hgb 10, plt 195. LDH 196. Okay to resume warfarin tonight.  PTA regimen 5 mg daily except 7.5 mg on Monday.  12/6 AM update: heparin level low this AM, no issues per RN.   Goal of Therapy:  Heparin level 0.3-0.7 units/ml Monitor platelets by anticoagulation protocol: Yes   Plan:  Inc heparin to 1400 units/hr Check anti-Xa level in 8 hours after restart and daily while on heparin Continue  to monitor H&H and platelets  Narda Bonds, PharmD, BCPS Clinical Pharmacist Phone: [phone]

## 2018-08-10 NOTE — Progress Notes (Addendum)
LVAD Coordinator Rounding Note:  Pt admitted 08/08/18 for preoperative anticoagulation management for 2nd state of LUE AV fistula (scheduled 08/09/18.)   HM III LVAD implanted on 06/12/17 by Dr. Prescott Gum under Destination Therapy criteria due to renal insufficiency. Pump exchange on 03/01/18 for outflow graft twist/obstruction.   Pt in bed this morning, just finished eating breakfast. States he is feeling good this morning, c/o minimal pain at left arm fistula site.   Vital signs: Temp: 98.1 HR:  85 Doppler Pressure: 96 Auto BP: 102/74 (83) O2 Sat: 100% on RA Wt: 80.5>80.3>82.3kg  LVAD interrogation reveals:  Speed: 5500 Flow: 4.0 Power: 4.5 w PI: 3.3 Alarms: none Events: 4-5 PI last 24 hrs Hematocrit: 28 Fixed speed: 5500 Low speed limit: 5200  Drive Line:  Asked by PA-Andy to assess driveline this morning as pt has been febrile. Pt states that his around his under the dressing his skin itches. I think pt may becoming intolerant to CHG. Existing VAD dressing removed and site care performed using sterile technique. Drive line exit site cleaned with Chlora prep applicators x 2, RINSED W/SALINE WIPES X 2, allowed to dry, and Sorbaview dressing with bio patch re-applied. NO SKIN PREP. Exit site healed and incorporated, there is a very small scab around the driveline, the velour is fully implanted at exit site. No redness, tenderness, drainage, foul odor or rash noted. Drive line anchor re-applied. I went over these changes with the pt so that he can communicate that to his daughter Margreta Journey who changes his dressing weekly. If skin continues to be irritated we will stop using CHG and try betadine. Next dressing is due 08/17/18.    Labs: LDH trend: [phone]  INR trend: 1.39>1.33  Blood Products:      Anticoagulation Plan: - INR Goal: 2.0 - 3.0 - ASA Dose: 81 mg daily  - Heparin gtt until INR therapeutic - per pharmacy  Device: -St Jude dual  -Therapies: on 200 bpm -  Monitor: on VT 150 bpm  Adverse Events on VAD: - ESRD post LVAD; started CVVH 06/15/17; HD started 06/20/17 - Hospitalization 11/5 - 07/16/17 > gastroparesis diagnosis after EGD - Hospitalization 11/26 - 08/04/17 > gastroparesis - Hospitalization 1/8 - 09/16/17>gastroparesis - [facility] to Dr. Corliss Parish at [facility] - 10/31/17>rightupper arm arteriovenous graft with Artegraft; ligation of right first stage brachial vein transposition  - 12/11/13 elective admission for thrombectomy for AV graft in RUA  - 12/20/17 > intractable N/V - 01/19/18 > intractable N/V sent by EMS from dialysis center - 03/01/18 > outflow graft twist/occulsion requiring pump exchange - 05/23/18 > admitted for non-working fistula in RUA - 06/10/18 > admitted for intractable N/V   Plan/Recommendations:  1. Please page VAD coordinator with any equipment questions or concerns.   [name] RN [place] Coordinator  Office: [phone]  24/7 Pager: [phone]

## 2018-08-10 NOTE — Progress Notes (Signed)
[name] for heparin Indication: LVAD/Factor 5   Allergies  Allergen Reactions  . Metformin And Related Diarrhea    Patient Measurements: Height: 5' 7"  (170.2 cm) Weight: 181 lb 7 oz (82.3 kg) IBW/kg (Calculated) : 66.1 Heparin Dosing Weight: 80.5 kg  Vital Signs: Temp: 98 F (36.7 C) (12/06 1139) Temp Source: Oral (12/06 1139) BP: 102/74 (12/06 1203) Pulse Rate: 85 (12/06 1203)  Labs: Recent Labs    08/08/18 1240  08/08/18 1248  08/09/18 0245 08/09/18 1050 08/10/18 0148 08/10/18 1145  HGB  --    < > 10.5*  --  10.0*  --  9.1*  --   HCT  --   --  32.7*  --  30.9*  --  28.6*  --   PLT  --   --  PLATELET CLUMPS NOTED ON SMEAR, UNABLE TO ESTIMATE  --  195  --  206  --   LABPROT 16.9*  --   --   --   --  14.2 16.4*  --   INR 1.39  --   --   --   --  1.10 1.33  --   HEPARINUNFRC  --   --   --    < >  --  <0.10* 0.23* 0.21*  CREATININE  --   --  3.00*  --  4.46*  --  7.74*  --    < > = values in this interval not displayed.    Estimated Creatinine Clearance: 11.3 mL/min (A) (by C-G formula based on SCr of 7.74 mg/dL (H)).   Medical History: Past Medical History:  Diagnosis Date  . Angina   . ASCVD (arteriosclerotic cardiovascular disease)    , Anterior infarction 2005, LAD diagonal bifurcation intervention 03/2004  . Automatic implantable cardiac defibrillator -St. Jude's       . Benign neoplasm of colon   . CHF (congestive heart failure) ([place])   . Chronic systolic heart failure ([place])   . Coronary artery disease     Widely patent previously placed stents in the left anterior   . Crohn's disease ([place])   . 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 Dialysis patient ([place])   . Dyspnea   . Gastroparesis   . GERD (gastroesophageal reflux disease)   . High cholesterol   . Hyperlipidemia   . Hypersomnolent    Previous diagnosis of narcolepsy  . Hypertension, essential   . Ischemic cardiomyopathy    Ejection  fraction 15-20% catheterization 2010  . Type II or unspecified type diabetes mellitus without mention of complication, not stated as uncontrolled   . Unspecified gastritis and gastroduodenitis without mention of hemorrhage     Medications:  Scheduled:  . amiodarone  200 mg Oral Daily  . aspirin EC  81 mg Oral Daily  . atorvastatin  40 mg Oral q1800  . calcitRIOL  0.5 mcg Oral Q M,W,F  . Chlorhexidine Gluconate Cloth  6 each Topical Q0600  . citalopram  20 mg Oral Daily  . darbepoetin (ARANESP) injection - DIALYSIS  40 mcg Intravenous Q Fri-HD  . dronabinol  2.5 mg Oral BID AC  . erythromycin  250 mg Oral TID  . insulin aspart  0-5 Units Subcutaneous QHS  . insulin aspart  0-9 Units Subcutaneous TID WC  . insulin aspart  3 Units Subcutaneous TID WC  . insulin glargine  8 Units Subcutaneous Daily  . isosorbide-hydrALAZINE  1 tablet Oral 3 times per  day on Sun Tue Thu Sat  . metoCLOPramide  10 mg Oral TID  . multivitamin  1 tablet Oral Daily  . ondansetron      . pantoprazole  40 mg Oral BID  . sevelamer carbonate  1,600 mg Oral 2 times per day  . sevelamer carbonate  4,000 mg Oral TID WC  . sodium chloride flush  3 mL Intravenous Q12H  . traMADol  50 mg Oral Q6H  . Warfarin - Pharmacist Dosing Inpatient   Does not apply q1800    Assessment: 75 yom with hx of Heartmate 3 LVAD and ESRD, plan for LUE AVF placement on 12/5. On warfarin PTA - last dose taken on 12/1.   INR 1.39 on admission. Underwent creation of AVF on 12/5 followed by cardiac cath. Pt started on IV heparin bridge while INR subtherapeutic. INR 1.33 today, LDH stable, Hgb down slightly. Heparin level below goal.  PTA regimen 5 mg daily except 7.5 mg on Monday.  Goal of Therapy:  Heparin level 0.3-0.7 units/ml Monitor platelets by anticoagulation protocol: Yes   Plan:  -Warfarin 7.66m PO x1 tonight -Increase heparin to 1550 units/hr -Repeat next heparin level with morning labs -Daily INR, CBC, LDH, heparin  level  MArrie Senate PharmD, BCPS Clinical Pharmacist [phone] check AMION for all MRuhenstrothnumbers 08/10/2018

## 2018-08-11 LAB — BASIC METABOLIC PANEL
Anion gap: 11 (ref 5–15)
BUN: 14 mg/dL (ref 6–20)
CO2: 25 mmol/L (ref 22–32)
Calcium: 8.4 mg/dL — ABNORMAL LOW (ref 8.9–10.3)
Chloride: 99 mmol/L (ref 98–111)
Creatinine, Ser: 5.07 mg/dL — ABNORMAL HIGH (ref 0.61–1.24)
GFR calc Af Amer: 14 mL/min — ABNORMAL LOW (ref >60)
GFR calc non Af Amer: 12 mL/min — ABNORMAL LOW (ref >60)
GLUCOSE: 100 mg/dL — AB (ref 70–99)
Potassium: 4 mmol/L (ref 3.5–5.1)
Sodium: 135 mmol/L (ref 135–145)

## 2018-08-11 LAB — GLUCOSE, CAPILLARY
Glucose-Capillary: 82 mg/dL (ref 70–99)
Glucose-Capillary: 145 mg/dL — ABNORMAL HIGH (ref 70–99)
Glucose-Capillary: 111 mg/dL — ABNORMAL HIGH (ref 70–99)
Glucose-Capillary: 107 mg/dL — ABNORMAL HIGH (ref 70–99)

## 2018-08-11 LAB — CBC
HCT: 28 % — ABNORMAL LOW (ref 39.0–52.0)
Hemoglobin: 8.8 g/dL — ABNORMAL LOW (ref 13.0–17.0)
MCH: 28.3 pg (ref 26.0–34.0)
MCHC: 31.4 g/dL (ref 30.0–36.0)
MCV: 90 fL (ref 80.0–100.0)
Platelets: 175 10*3/uL (ref 150–400)
RBC: 3.11 MIL/uL — ABNORMAL LOW (ref 4.22–5.81)
RDW: 14.8 % (ref 11.5–15.5)
WBC: 9.7 10*3/uL (ref 4.0–10.5)
nRBC: 0 % (ref 0.0–0.2)

## 2018-08-11 LAB — HEPARIN LEVEL (UNFRACTIONATED)
HEPARIN UNFRACTIONATED: 0.88 [IU]/mL — AB (ref 0.30–0.70)
Heparin Unfractionated: 0.29 IU/mL — ABNORMAL LOW (ref 0.30–0.70)
Heparin Unfractionated: 0.73 IU/mL — ABNORMAL HIGH (ref 0.30–0.70)

## 2018-08-11 LAB — PROTIME-INR
INR: 1.41
Prothrombin Time: 17.1 seconds — ABNORMAL HIGH (ref 11.4–15.2)

## 2018-08-11 LAB — LACTATE DEHYDROGENASE: LDH: 191 U/L (ref 98–192)

## 2018-08-11 MED ORDER — SODIUM CHLORIDE 0.9 % IV SOLN
2.0000 g | INTRAVENOUS | Status: AC
  Administered 2018-08-13 – 2018-08-17 (×3): 2 g via INTRAVENOUS
  Filled 2018-08-11 (×4): qty 2

## 2018-08-11 MED ORDER — WARFARIN SODIUM 7.5 MG PO TABS
7.5000 mg | ORAL_TABLET | Freq: Once | ORAL | Status: AC
  Administered 2018-08-11: 7.5 mg via ORAL
  Filled 2018-08-11: qty 1

## 2018-08-11 MED ORDER — FENTANYL CITRATE (PF) 100 MCG/2ML IJ SOLN
25.0000 ug | Freq: Once | INTRAMUSCULAR | Status: AC
  Administered 2018-08-11: 25 ug via INTRAVENOUS
  Filled 2018-08-11: qty 2

## 2018-08-11 MED ORDER — SODIUM CHLORIDE 0.9 % IV SOLN
2.0000 g | Freq: Once | INTRAVENOUS | Status: AC
  Administered 2018-08-11: 2 g via INTRAVENOUS
  Filled 2018-08-11: qty 2

## 2018-08-11 NOTE — Progress Notes (Addendum)
Patient ID: [name], male   DOB: Jan 02, 1966, 52 y.o.   MRN: [id number]   Advanced Heart Failure VAD Team Note  PCP-Cardiologist: No primary care provider on file.   Subjective:    08/09/18 underwent US-guided cannulation of LUE AV fistula and found to be sclerotic and not suitable for dialysis. LUE graft created from brachial artery to axillary vein. Coumadin resumed. Heparin gtt bridge. INR 1.4 this am.   Tm 100.7 last night, afebrile today.  Cultures negative so far, driveline ok, WBCs not elevated.  CXR with left lingular atelectasis versus infiltrate.    LVAD Interrogation HM 3: Speed: 5500 Flow: 4.4 PI: 3.4 Power: 4.4. 2 PI events x 24 hrs.  [place] 08/09/18  RA mean 5 RV 34/6 PA 33/9, mean 18 PCWP mean 7 Oxygen saturations: PA 70% AO 100% Cardiac Output (Fick) 6.28  Cardiac Index (Fick) 3.26 PVR 1.75 WU Cardiac Output (Thermo) 5.56 Cardiac Index (Thermo) 2.89 PVR 1.98 WU  Objective:    Vital Signs:   Temp:  [97.8 F (36.6 C)-100.7 F (38.2 C)] 99.1 F (37.3 C) (12/07 0746) Pulse Rate:  [54-95] 85 (12/07 0746) Resp:  [13-21] 15 (12/07 0746) BP: (84-114)/(58-91) 106/81 (12/07 0746) SpO2:  [95 %-100 %] 98 % (12/07 0746) Weight:  [80.9 kg-81.2 kg] 81.2 kg (12/07 0406) Last BM Date: 08/08/18 Mean arterial Pressure 80s-90  Intake/Output:   Intake/Output Summary (Last 24 hours) at 08/11/2018 0957 Last data filed at 08/11/2018 0746 Gross per 24 hour  Intake 797.13 ml  Output 1501 ml  Net -703.87 ml     Physical Exam    General: NAD.  HEENT: Normal. Neck: Supple, JVP 6-7 cm. Carotids OK.  Cardiac:  Mechanical heart sounds with LVAD hum present. Left chest HD cath. Lungs:  CTAB, normal effort.  Abdomen:  NT, ND, no HSM. No bruits or masses. +BS  LVAD exit site: Dressing dry and intact. Small amount of moisture noted through techaderm. No erythema or drainage. Stabilization device present and accurately applied. Driveline dressing changed daily per sterile  technique. Extremities:  Warm and dry. No cyanosis, clubbing, rash, or edema.  Neuro:  Alert & oriented x 3. Cranial nerves grossly intact. Moves all 4 extremities w/o difficulty. Affect pleasant     Telemetry   NSR 80s-90s, personally reviewed.   EKG    No new tracings.    Labs   Basic Metabolic Panel: Recent Labs  Lab 08/08/18 1248 08/09/18 0245 08/10/18 0148 08/11/18 0231  NA 134* 134* 134* 135  K 3.2* 3.4* 3.9 4.0  CL 97* 97* 97* 99  CO2 23 26 24 25   GLUCOSE 140* 189* 191* 100*  BUN 13 22* 32* 14  CREATININE 3.00* 4.46* 7.74* 5.07*  CALCIUM 9.1 8.6* 8.0* 8.4*  MG 1.6* 2.8*  --   --     Liver Function Tests: Recent Labs  Lab 08/08/18 1248  AST 18  ALT 19  ALKPHOS 87  BILITOT 0.6  PROT 6.8  ALBUMIN 3.2*   No results for input(s): LIPASE, AMYLASE in the last 168 hours. No results for input(s): AMMONIA in the last 168 hours.  CBC: Recent Labs  Lab 08/08/18 1248 08/09/18 0245 08/10/18 0148 08/11/18 0231  WBC 8.2 7.8 8.3 9.7  NEUTROABS 6.4  --   --   --   HGB 10.5* 10.0* 9.1* 8.8*  HCT 32.7* 30.9* 28.6* 28.0*  MCV 86.7 87.5 88.0 90.0  PLT PLATELET CLUMPS NOTED ON SMEAR, UNABLE TO ESTIMATE 195 206 175  INR: Recent Labs  Lab 08/06/18 08/08/18 1240 08/09/18 1050 08/10/18 0148 08/11/18 0231  INR 2.8 1.39 1.10 1.33 1.41    Other results:  EKG:    Imaging   Dg Chest Port 1 View  Result Date: 08/10/2018 CLINICAL DATA:  Fever. EXAM: PORTABLE CHEST 1 VIEW COMPARISON:  Radiograph of July 29, 2018. FINDINGS: Left ventricular assist device is unchanged in position. Left-sided pacemaker is unchanged in position. Left internal jugular dialysis catheter is unchanged in position. No pneumothorax or significant pleural effusion is noted. Right lung is clear. Minimal lingular opacity is noted concerning for subsegmental atelectasis or infiltrate. Bony thorax is unremarkable. IMPRESSION: Stable appearance of left ventricular assist device and  left-sided pacemaker. Minimal left lingular opacity is noted concerning for subsegmental atelectasis or possibly infiltrate. Electronically Signed   By: Marijo Conception, M.D.   On: 08/10/2018 09:49     Medications:     Scheduled Medications: . amiodarone  200 mg Oral Daily  . aspirin EC  81 mg Oral Daily  . atorvastatin  40 mg Oral q1800  . calcitRIOL  0.5 mcg Oral Q M,W,F  . Chlorhexidine Gluconate Cloth  6 each Topical Q0600  . citalopram  20 mg Oral Daily  . darbepoetin (ARANESP) injection - DIALYSIS  40 mcg Intravenous Q Fri-HD  . dronabinol  2.5 mg Oral BID AC  . erythromycin  250 mg Oral TID  . insulin aspart  0-5 Units Subcutaneous QHS  . insulin aspart  0-9 Units Subcutaneous TID WC  . insulin aspart  3 Units Subcutaneous TID WC  . insulin glargine  8 Units Subcutaneous Daily  . isosorbide-hydrALAZINE  1 tablet Oral 3 times per day on Sun Tue Thu Sat  . metoCLOPramide  10 mg Oral TID  . multivitamin  1 tablet Oral Daily  . pantoprazole  40 mg Oral BID  . sevelamer carbonate  1,600 mg Oral 2 times per day  . sevelamer carbonate  4,000 mg Oral TID WC  . sodium chloride flush  3 mL Intravenous Q12H  . traMADol  50 mg Oral Q6H  . Warfarin - Pharmacist Dosing Inpatient   Does not apply q1800    Infusions: . sodium chloride    . sodium chloride    . heparin 1,550 Units/hr (08/11/18 0272)    PRN Medications: sodium chloride, acetaminophen, docusate sodium, hydrALAZINE, lidocaine-prilocaine, LORazepam, ondansetron (ZOFRAN) IV, promethazine, sodium chloride flush   Patient Profile   [name] is a52 y.o.with history of CAD s/p anterior MI in 2010 and NSTEMI in 3/18 with DES to pRCA and mLAD, diabetic, gastroparesis, Chrons disease, DMIIand ischemic cardiomyopathy, 10/2018s/p Heartmate 3 LVAD (emergent exchange 02/2018 due to outflow graft kink) and ESRD (HD MWF). He has LUE AVR 05/2018.  He is being admitted for peri-op management of 2nd stage of  AVF.  Assessment/Plan:    1. Chronic systolic CHF: Ischemic cardiomyopathy. St Jude ICD. NSTEMI in March 2018 with DES to LAD and RCA, complicated by cardiogenic shock, low output requiring milrinone. Echo 8/18 with EF 20-25%, moderate [name]. CPX (8/18) with severe functional impairment due to HF. He is s/p HM3 LVAD placement. He had pump thrombosis 6/19 due to [facility] outflow graft kinking. He had pump exchange for another HM3. RHC showed low filling pressures and good cardiac output this admission.  - Discussed increasing HD dry weight with Dr. Posey Pronto yesterday.  - Continue Bidil 1 tab TID on non-HD days.  - Continue ASA 81.  - Warfarin restarted post-procedure,  INR 1.4 today and still on heparin gtt.  - He is now listed for heart/kidney at [facility]. [place] 08/09/18 with optimized filling pressures and cardiac output. Normal PA pressure.  2. ESRD: Developed peri-operatively after HM3 placement. He is tolerating HD, currently via catheter but had LUE graft placed this admission - MWF HD - As above, discussed increasing dry weight with Dr. Posey Pronto based on [place] and low flows at dialysis last week.  3. CAD: NSTEMI in 3/18. LHC with 99% ulcerated lesion proximal RCA with left to right collaterals, 95% mid LAD stenosis after mid LAD stent. s/p PCI to RCA and LAD on 11/25/16. No chest pain.   - Continue statin and ASA. 4. h/o DVTs: Factor V Leiden heterozygote.  5. Diabetic gastroparesis: Difficult to manage. He has seen gastroparesis specialist at [facility]. Surprisingly, gastric emptying study was normal. However, electrogastrogram showed poor gastric accomodation/capacity. Therefore, small frequent meals recommended. Also morning nausea thought to be GERD and Protonix was increased. Nausea seems to drive markedly high BP (resolves with nausea control).  Currently stable, no nausea.  - Continue reglan 5 mg tid/ac, chronic erythromycin, and Marinol.  - Continue Protonix BID.  - Make effort to eat  small, more frequent meals.  6. DM: HgbA1c well-controlled recently. No change.   7. HTN: MAP 80-90s, will be getting hydralazine today (non-HD day).  Hypertension seems to be driven by nausea (see above), so will try to minimize nausea.  8. PAD: Moderate bilateral disease on ABIs in 8/18. Denies claudication currently, no pedal ulcers. No change.   9. Atrial flutter: Paroxysmal. NSR this admission.   10. Fever: Tmax to 100.7 last night, afebrile today. Denies Cough, chills, or driveline drainage.  CXR with possible left lingular infiltrate versus atelectasis.  WBCs not elevated. Driveline site ok.  - Will treat with course of levofloxacin for possible PNA, will have to dose for ESRD.    I reviewed the LVAD parameters from today, and compared the results to the patient's prior recorded data.  No programming changes were made.  The LVAD is functioning within specified parameters.  The patient performs LVAD self-test daily.  LVAD interrogation was negative for any significant power changes, alarms or PI events/speed drops.  LVAD equipment check completed and is in good working order.  Back-up equipment present.   LVAD education done on emergency procedures and precautions and reviewed exit site care.   Length of Stay: 3  Loralie Champagne, MD 08/11/2018, 9:57 AM  VAD Team --- VAD ISSUES ONLY--- Pager [phone] (7am - 7am)  Advanced Heart Failure Team  Pager [phone] (M-F; 7a - 4p)  Please contact [facility] for night-coverage after hours (4p -7a ) and weekends on [email or web address]

## 2018-08-11 NOTE — Progress Notes (Signed)
Patient ID: [name], male   DOB: 09/18/1965, 52 y.o.   MRN: [id number]  [facility] Progress Note   Assessment/ Plan:   1. Status post left upper arm AV graft: Poor vein maturation of first stage brachiobasilic fistula noted intraoperatively prompting placement of left upper arm AV graft.  Await 3 weeks for incorporation prior to attempts at cannulation.  I do not auscultate a bruit today and he has some asymmetric swelling in his arm-edema versus hematoma. 2. ESRD: Continue hemodialysis on a Monday/Wednesday/Friday schedule, discussed yesterday with Dr. Aundra Dubin regarding right heart catheterization findings indicating that he is intravascularly contracted and possibly under his dry weight.  We will revise this with future dialysis. 3. Anemia: Hemoglobin level slightly lower postoperatively, continue to follow on ESA. 4. CKD-MBD: Continue renal diet, phosphorus binders. 5. Nutrition: Continue renal diet, renal vitamin, minus 6.  Fever/tachycardia: Unclear source, will continue to monitor fever curve/CBC, blood cultures pending.  Subjective:   Reports be comfortable at this time and tolerated dialysis yesterday without problems.   Objective:   BP 108/87 (BP Location: Right Arm)   Pulse 93   Temp 98.4 F (36.9 C) (Oral)   Resp 20   Ht 5' 7"  (1.702 m)   Wt 81.2 kg   SpO2 96%   BMI 28.04 kg/m   Physical Exam: Gen: Comfortably resting in bed, oriented x3 CVS: Pulse regular tachycardia, precordial hum audible Resp: Anteriorly clear to auscultation, no rales/rhonchi Abd: Soft, flat, nontender Ext: Without lower extremity edema, left upper arm AVG without audible bruit and with overlying asymmetric swelling  Labs: BMET Recent Labs  Lab 08/08/18 1248 08/09/18 0245 08/10/18 0148 08/11/18 0231  NA 134* 134* 134* 135  K 3.2* 3.4* 3.9 4.0  CL 97* 97* 97* 99  CO2 23 26 24 25   GLUCOSE 140* 189* 191* 100*  BUN 13 22* 32* 14  CREATININE 3.00* 4.46* 7.74* 5.07*   CALCIUM 9.1 8.6* 8.0* 8.4*   CBC Recent Labs  Lab 08/08/18 1248 08/09/18 0245 08/10/18 0148 08/11/18 0231  WBC 8.2 7.8 8.3 9.7  NEUTROABS 6.4  --   --   --   HGB 10.5* 10.0* 9.1* 8.8*  HCT 32.7* 30.9* 28.6* 28.0*  MCV 86.7 87.5 88.0 90.0  PLT PLATELET CLUMPS NOTED ON SMEAR, UNABLE TO ESTIMATE 195 206 175    @IMGRELPRIORS @ Medications:    . amiodarone  200 mg Oral Daily  . aspirin EC  81 mg Oral Daily  . atorvastatin  40 mg Oral q1800  . calcitRIOL  0.5 mcg Oral Q M,W,F  . Chlorhexidine Gluconate Cloth  6 each Topical Q0600  . citalopram  20 mg Oral Daily  . darbepoetin (ARANESP) injection - DIALYSIS  40 mcg Intravenous Q Fri-HD  . dronabinol  2.5 mg Oral BID AC  . erythromycin  250 mg Oral TID  . insulin aspart  0-5 Units Subcutaneous QHS  . insulin aspart  0-9 Units Subcutaneous TID WC  . insulin aspart  3 Units Subcutaneous TID WC  . insulin glargine  8 Units Subcutaneous Daily  . isosorbide-hydrALAZINE  1 tablet Oral 3 times per day on Sun Tue Thu Sat  . metoCLOPramide  10 mg Oral TID  . multivitamin  1 tablet Oral Daily  . pantoprazole  40 mg Oral BID  . sevelamer carbonate  1,600 mg Oral 2 times per day  . sevelamer carbonate  4,000 mg Oral TID WC  . sodium chloride flush  3 mL Intravenous Q12H  .  traMADol  50 mg Oral Q6H  . Warfarin - Pharmacist Dosing Inpatient   Does not apply [id number]   Elmarie Shiley, MD 08/11/2018, 7:41 AM

## 2018-08-11 NOTE — Plan of Care (Signed)
  Problem: Education: Goal: Patient will be able to verbalize current INR target range and antiplatelet therapy for discharge home Outcome: Progressing   Problem: Clinical Measurements: Goal: Ability to maintain clinical measurements within normal limits will improve Outcome: Progressing Goal: Will remain free from infection Outcome: Progressing Goal: Diagnostic test results will improve Outcome: Progressing Goal: Cardiovascular complication will be avoided Outcome: Progressing   Problem: Activity: Goal: Risk for activity intolerance will decrease Outcome: Progressing   Problem: Nutrition: Goal: Adequate nutrition will be maintained Outcome: Progressing   Problem: Pain Managment: Goal: General experience of comfort will improve Outcome: Progressing   Problem: Safety: Goal: Ability to remain free from injury will improve Outcome: Progressing   Problem: Skin Integrity: Goal: Risk for impaired skin integrity will decrease Outcome: Progressing

## 2018-08-11 NOTE — Progress Notes (Signed)
Patient c/o of severe pain to LUE graft 7/10 site continues to be swollen.  I notified MD and she agreed to IV Fentanyl x 1 dose for relief. I will continue to monitor.

## 2018-08-11 NOTE — Progress Notes (Signed)
Pharmacy Antibiotic Note  [name] is a 52 y.o. male admitted on 08/08/2018 with pneumonia.  Pharmacy has been consulted for cefepime dosing.  New fever overnight.  Driveline site looks okay, recent MRSA screen negative.  Pt is ESRD, on HD MWF.    Plan: Cefepime 2g IV x 1 now, then 2g qHD. F/u cultures, fever, clinical course.  Height: 5' 7"  (170.2 cm) Weight: 179 lb 0.2 oz (81.2 kg) IBW/kg (Calculated) : 66.1  Temp (24hrs), Avg:98.6 F (37 C), Min:97.8 F (36.6 C), Max:100.7 F (38.2 C)  Recent Labs  Lab 08/08/18 1248 08/09/18 0245 08/10/18 0148 08/11/18 0231  WBC 8.2 7.8 8.3 9.7  CREATININE 3.00* 4.46* 7.74* 5.07*    Estimated Creatinine Clearance: 17.2 mL/min (A) (by C-G formula based on SCr of 5.07 mg/dL (H)).    Allergies  Allergen Reactions  . Metformin And Related Diarrhea    Antimicrobials this admission: Cefepime 12/7 >   Dose adjustments this admission:  Microbiology results: 12/6 BCx x 2:  12/6 MRSA PCR: neg  Thank you for allowing pharmacy to be a part of this patient's care.  Marguerite Olea, [facility] Clinical Pharmacist Phone [phone]  08/11/2018 11:05 AM

## 2018-08-11 NOTE — Progress Notes (Signed)
[place] for heparin/coumadin Indication: LVAD/Factor 5   Allergies  Allergen Reactions  . Metformin And Related Diarrhea    Patient Measurements: Height: 5' 7"  (170.2 cm) Weight: 179 lb 0.2 oz (81.2 kg) IBW/kg (Calculated) : 66.1 Heparin Dosing Weight: 80.5 kg  Vital Signs: Temp: 98.7 F (37.1 C) (12/07 1143) Temp Source: Oral (12/07 1143) BP: 87/65 (12/07 1143) Pulse Rate: 87 (12/07 1143)  Labs: Recent Labs    08/09/18 0245 08/09/18 1050 08/10/18 0148 08/10/18 1145 08/11/18 0231 08/11/18 1144  HGB 10.0*  --  9.1*  --  8.8*  --   HCT 30.9*  --  28.6*  --  28.0*  --   PLT 195  --  206  --  175  --   LABPROT  --  14.2 16.4*  --  17.1*  --   INR  --  1.10 1.33  --  1.41  --   HEPARINUNFRC  --  <0.10* 0.23* 0.21* 0.29* 0.88*  CREATININE 4.46*  --  7.74*  --  5.07*  --     Estimated Creatinine Clearance: 17.2 mL/min (A) (by C-G formula based on SCr of 5.07 mg/dL (H)).   Medical History: Past Medical History:  Diagnosis Date  . Angina   . ASCVD (arteriosclerotic cardiovascular disease)    , Anterior infarction 2005, LAD diagonal bifurcation intervention 03/2004  . Automatic implantable cardiac defibrillator -St. Jude's       . Benign neoplasm of colon   . CHF (congestive heart failure) ([place])   . Chronic systolic heart failure ([place])   . Coronary artery disease     Widely patent previously placed stents in the left anterior   . Crohn's disease ([place])   . 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 Dialysis patient ([place])   . Dyspnea   . Gastroparesis   . GERD (gastroesophageal reflux disease)   . High cholesterol   . Hyperlipidemia   . Hypersomnolent    Previous diagnosis of narcolepsy  . Hypertension, essential   . Ischemic cardiomyopathy    Ejection fraction 15-20% catheterization 2010  . Type II or unspecified type diabetes mellitus without mention of complication, not stated as uncontrolled   .  Unspecified gastritis and gastroduodenitis without mention of hemorrhage     Medications:  Scheduled:  . amiodarone  200 mg Oral Daily  . aspirin EC  81 mg Oral Daily  . atorvastatin  40 mg Oral q1800  . calcitRIOL  0.5 mcg Oral Q M,W,F  . Chlorhexidine Gluconate Cloth  6 each Topical Q0600  . citalopram  20 mg Oral Daily  . darbepoetin (ARANESP) injection - DIALYSIS  40 mcg Intravenous Q Fri-HD  . dronabinol  2.5 mg Oral BID AC  . erythromycin  250 mg Oral TID  . insulin aspart  0-5 Units Subcutaneous QHS  . insulin aspart  0-9 Units Subcutaneous TID WC  . insulin aspart  3 Units Subcutaneous TID WC  . insulin glargine  8 Units Subcutaneous Daily  . isosorbide-hydrALAZINE  1 tablet Oral 3 times per day on Sun Tue Thu Sat  . metoCLOPramide  10 mg Oral TID  . multivitamin  1 tablet Oral Daily  . pantoprazole  40 mg Oral BID  . sevelamer carbonate  1,600 mg Oral 2 times per day  . sevelamer carbonate  4,000 mg Oral TID WC  . sodium chloride flush  3 mL Intravenous Q12H  . traMADol  50 mg  Oral Q6H  . Warfarin - Pharmacist Dosing Inpatient   Does not apply q1800    Assessment: 33 yom with hx of Heartmate 3 LVAD and ESRD, plan for LUE AVF placement on 12/5. On warfarin PTA - last dose taken on 12/1.   INR 1.39 on admission. Underwent creation of AVF on 12/5 followed by cardiac cath. Pt started on IV heparin bridge while INR subtherapeutic. INR 1.41 today, LDH stable, Hgb down slightly. Heparin level now above goal.  Level drawn from same arm as heparin infusion, but confirmed with RN drawn below infusion site.  PTA regimen 5 mg daily except 7.5 mg on Monday.  Goal of Therapy:  Heparin level 0.3-0.7 units/ml Monitor platelets by anticoagulation protocol: Yes   Plan:  -Warfarin 7.1m PO x 1 again tonight -Decrease heparin to 1450 units/hr -Recheck heparin level in 8 hrs -Daily INR, CBC, LDH, heparin level  JNevada Crane PRoylene Reason BAll City Family Healthcare Center IncClinical Pharmacist Phone [phone] 08/11/2018 12:46 PM

## 2018-08-11 NOTE — Progress Notes (Addendum)
[place] for Heparin Indication: LVAD/Factor 5   Allergies  Allergen Reactions  . Metformin And Related Diarrhea    Patient Measurements: Height: 5' 7"  (170.2 cm) Weight: 178 lb 5.6 oz (80.9 kg)(stood to scale ) IBW/kg (Calculated) : 66.1 Heparin Dosing Weight: 80.5 kg  Vital Signs: Temp: 100.7 F (38.2 C) (12/07 0000) Temp Source: Oral (12/07 0000) BP: 103/68 (12/07 0000) Pulse Rate: 93 (12/07 0000)  Labs: Recent Labs    08/09/18 0245 08/09/18 1050 08/10/18 0148 08/10/18 1145 08/11/18 0231  HGB 10.0*  --  9.1*  --  8.8*  HCT 30.9*  --  28.6*  --  28.0*  PLT 195  --  206  --  175  LABPROT  --  14.2 16.4*  --  17.1*  INR  --  1.10 1.33  --  1.41  HEPARINUNFRC  --  <0.10* 0.23* 0.21* 0.29*  CREATININE 4.46*  --  7.74*  --  5.07*    Estimated Creatinine Clearance: 17.2 mL/min (A) (by C-G formula based on SCr of 5.07 mg/dL (H)).   Medical History: Past Medical History:  Diagnosis Date  . Angina   . ASCVD (arteriosclerotic cardiovascular disease)    , Anterior infarction 2005, LAD diagonal bifurcation intervention 03/2004  . Automatic implantable cardiac defibrillator -St. Jude's       . Benign neoplasm of colon   . CHF (congestive heart failure) ([place])   . Chronic systolic heart failure ([place])   . Coronary artery disease     Widely patent previously placed stents in the left anterior   . Crohn's disease ([place])   . 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 Dialysis patient ([place])   . Dyspnea   . Gastroparesis   . GERD (gastroesophageal reflux disease)   . High cholesterol   . Hyperlipidemia   . Hypersomnolent    Previous diagnosis of narcolepsy  . Hypertension, essential   . Ischemic cardiomyopathy    Ejection fraction 15-20% catheterization 2010  . Type II or unspecified type diabetes mellitus without mention of complication, not stated as uncontrolled   . Unspecified gastritis and gastroduodenitis  without mention of hemorrhage     Medications:  Scheduled:  . amiodarone  200 mg Oral Daily  . aspirin EC  81 mg Oral Daily  . atorvastatin  40 mg Oral q1800  . calcitRIOL  0.5 mcg Oral Q M,W,F  . Chlorhexidine Gluconate Cloth  6 each Topical Q0600  . citalopram  20 mg Oral Daily  . darbepoetin (ARANESP) injection - DIALYSIS  40 mcg Intravenous Q Fri-HD  . dronabinol  2.5 mg Oral BID AC  . erythromycin  250 mg Oral TID  . heparin      . insulin aspart  0-5 Units Subcutaneous QHS  . insulin aspart  0-9 Units Subcutaneous TID WC  . insulin aspart  3 Units Subcutaneous TID WC  . insulin glargine  8 Units Subcutaneous Daily  . isosorbide-hydrALAZINE  1 tablet Oral 3 times per day on Sun Tue Thu Sat  . metoCLOPramide  10 mg Oral TID  . multivitamin  1 tablet Oral Daily  . pantoprazole  40 mg Oral BID  . sevelamer carbonate  1,600 mg Oral 2 times per day  . sevelamer carbonate  4,000 mg Oral TID WC  . sodium chloride flush  3 mL Intravenous Q12H  . traMADol  50 mg Oral Q6H  . Warfarin - Pharmacist Dosing Inpatient  Does not apply q1800    Assessment: 55 yom with hx of Heartmate 3 LVAD and ESRD, plan for LUE AVF placement on 12/5. On warfarin PTA - last dose taken on 12/1.   INR 1.39 on admission. Underwent creation of AVF today on 12/5 followed by cardiac cath today. Plan to start heparin infusion 4 hours after sheath removal  (documented at 12/5 at 1229). No s/sx of bleeding. Hgb 10, plt 195. LDH 196. Okay to resume warfarin tonight.  PTA regimen 5 mg daily except 7.5 mg on Monday.  12/7 AM update: heparin level low at 0.29 this AM, however, heparin was off for one hour just before lab draw due to loss of access, will not make any changes right now  Goal of Therapy:  Heparin level 0.3-0.7 units/ml Monitor platelets by anticoagulation protocol: Yes   Plan:  -Cont heparin at 1550 units/hr due to off time -Re-time heparin level for [place], PharmD, BCPS Clinical  Pharmacist Phone: [phone]

## 2018-08-11 NOTE — Progress Notes (Signed)
[name] for heparin/coumadin Indication: LVAD/Factor 5   Allergies  Allergen Reactions  . Metformin And Related Diarrhea    Patient Measurements: Height: 5' 7"  (170.2 cm) Weight: 179 lb 0.2 oz (81.2 kg) IBW/kg (Calculated) : 66.1 Heparin Dosing Weight: 80.5 kg  Vital Signs: Temp: 98.5 F (36.9 C) (12/07 1937) Temp Source: Oral (12/07 1937) BP: 90/71 (12/07 2018) Pulse Rate: 88 (12/07 1542)  Labs: Recent Labs    08/09/18 0245 08/09/18 1050 08/10/18 0148  08/11/18 0231 08/11/18 1144 08/11/18 2038  HGB 10.0*  --  9.1*  --  8.8*  --   --   HCT 30.9*  --  28.6*  --  28.0*  --   --   PLT 195  --  206  --  175  --   --   LABPROT  --  14.2 16.4*  --  17.1*  --   --   INR  --  1.10 1.33  --  1.41  --   --   HEPARINUNFRC  --  <0.10* 0.23*   < > 0.29* 0.88* 0.73*  CREATININE 4.46*  --  7.74*  --  5.07*  --   --    < > = values in this interval not displayed.    Estimated Creatinine Clearance: 17.2 mL/min (A) (by C-G formula based on SCr of 5.07 mg/dL (H)).   Medical History: Past Medical History:  Diagnosis Date  . Angina   . ASCVD (arteriosclerotic cardiovascular disease)    , Anterior infarction 2005, LAD diagonal bifurcation intervention 03/2004  . Automatic implantable cardiac defibrillator -[employer]       . Benign neoplasm of colon   . CHF (congestive heart failure) ([place])   . Chronic systolic heart failure ([place])   . Coronary artery disease     Widely patent previously placed stents in the left anterior   . Crohn's disease ([place])   . 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 Dialysis patient ([place])   . Dyspnea   . Gastroparesis   . GERD (gastroesophageal reflux disease)   . High cholesterol   . Hyperlipidemia   . Hypersomnolent    Previous diagnosis of narcolepsy  . Hypertension, essential   . Ischemic cardiomyopathy    Ejection fraction 15-20% catheterization 2010  . Type II or unspecified type  diabetes mellitus without mention of complication, not stated as uncontrolled   . Unspecified gastritis and gastroduodenitis without mention of hemorrhage     Medications:  Scheduled:  . amiodarone  200 mg Oral Daily  . aspirin EC  81 mg Oral Daily  . atorvastatin  40 mg Oral q1800  . calcitRIOL  0.5 mcg Oral Q M,W,F  . Chlorhexidine Gluconate Cloth  6 each Topical Q0600  . citalopram  20 mg Oral Daily  . darbepoetin (ARANESP) injection - DIALYSIS  40 mcg Intravenous Q Fri-HD  . dronabinol  2.5 mg Oral BID AC  . erythromycin  250 mg Oral TID  . fentaNYL (SUBLIMAZE) injection  25 mcg Intravenous Once  . insulin aspart  0-5 Units Subcutaneous QHS  . insulin aspart  0-9 Units Subcutaneous TID WC  . insulin aspart  3 Units Subcutaneous TID WC  . insulin glargine  8 Units Subcutaneous Daily  . isosorbide-hydrALAZINE  1 tablet Oral 3 times per day on Sun Tue Thu Sat  . metoCLOPramide  10 mg Oral TID  . multivitamin  1 tablet Oral Daily  .  pantoprazole  40 mg Oral BID  . sevelamer carbonate  1,600 mg Oral 2 times per day  . sevelamer carbonate  4,000 mg Oral TID WC  . sodium chloride flush  3 mL Intravenous Q12H  . traMADol  50 mg Oral Q6H  . Warfarin - Pharmacist Dosing Inpatient   Does not apply q1800    Assessment: 43 yom with hx of Heartmate 3 LVAD and ESRD, plan for LUE AVF placement on 12/5. On warfarin PTA - last dose taken on 12/1. INR 1.39 on admission. Underwent creation of AVF on 12/5 followed by cardiac cath. Pt started on IV heparin bridge while INR subtherapeutic.  Heparin level this evening remains SUPRAtherapeutic despite a rate decrease earlier today (HL 0.73 << 0.88, goal of 0.3-0.7). Heparin level drawn from wrist and the heparin drip is running in the forearm. Will reduce the drip rate again this evening. Phlebotomy did not pause the drip around the draw - but would recommend pausing for 5-10 minutes before the next lab draw to see if it helps with accuracy.   Goal of  Therapy:  Heparin level 0.3-0.7 units/ml Monitor platelets by anticoagulation protocol: Yes   Plan:  - Reduce Heparin drip to 1350 units/hr (13.5 ml/hr) - Will continue to monitor for any signs/symptoms of bleeding and will follow up with heparin level in 8 hours   Thank you for allowing pharmacy to be a part of this patient's care.  Alycia Rossetti, PharmD, BCPS Clinical Pharmacist Please check AMION for all [facility] numbers 08/11/2018 9:54 PM

## 2018-08-12 LAB — CBC
HCT: 26.8 % — ABNORMAL LOW (ref 39.0–52.0)
HEMOGLOBIN: 8.1 g/dL — AB (ref 13.0–17.0)
MCH: 27.3 pg (ref 26.0–34.0)
MCHC: 30.2 g/dL (ref 30.0–36.0)
MCV: 90.2 fL (ref 80.0–100.0)
NRBC: 0 % (ref 0.0–0.2)
Platelets: 184 10*3/uL (ref 150–400)
RBC: 2.97 MIL/uL — ABNORMAL LOW (ref 4.22–5.81)
RDW: 14.8 % (ref 11.5–15.5)
WBC: 14.5 10*3/uL — ABNORMAL HIGH (ref 4.0–10.5)

## 2018-08-12 LAB — BASIC METABOLIC PANEL
Anion gap: 12 (ref 5–15)
BUN: 21 mg/dL — ABNORMAL HIGH (ref 6–20)
CO2: 24 mmol/L (ref 22–32)
Calcium: 8.3 mg/dL — ABNORMAL LOW (ref 8.9–10.3)
Chloride: 96 mmol/L — ABNORMAL LOW (ref 98–111)
Creatinine, Ser: 7.66 mg/dL — ABNORMAL HIGH (ref 0.61–1.24)
GFR calc Af Amer: 8 mL/min — ABNORMAL LOW (ref >60)
GFR calc non Af Amer: 7 mL/min — ABNORMAL LOW (ref >60)
Glucose, Bld: 148 mg/dL — ABNORMAL HIGH (ref 70–99)
POTASSIUM: 4.2 mmol/L (ref 3.5–5.1)
Sodium: 132 mmol/L — ABNORMAL LOW (ref 135–145)

## 2018-08-12 LAB — LACTATE DEHYDROGENASE: LDH: 181 U/L (ref 98–192)

## 2018-08-12 LAB — HEPARIN LEVEL (UNFRACTIONATED): HEPARIN UNFRACTIONATED: 0.72 [IU]/mL — AB (ref 0.30–0.70)

## 2018-08-12 LAB — GLUCOSE, CAPILLARY
Glucose-Capillary: 126 mg/dL — ABNORMAL HIGH (ref 70–99)
Glucose-Capillary: 183 mg/dL — ABNORMAL HIGH (ref 70–99)
Glucose-Capillary: 164 mg/dL — ABNORMAL HIGH (ref 70–99)
Glucose-Capillary: 122 mg/dL — ABNORMAL HIGH (ref 70–99)

## 2018-08-12 LAB — PROTIME-INR
INR: 1.96
Prothrombin Time: 22.1 seconds — ABNORMAL HIGH (ref 11.4–15.2)

## 2018-08-12 MED ORDER — VANCOMYCIN HCL 10 G IV SOLR
1750.0000 mg | Freq: Once | INTRAVENOUS | Status: AC
  Administered 2018-08-12: 1750 mg via INTRAVENOUS
  Filled 2018-08-12 (×2): qty 1750

## 2018-08-12 MED ORDER — MORPHINE SULFATE (PF) 2 MG/ML IV SOLN
2.0000 mg | Freq: Once | INTRAVENOUS | Status: AC
  Administered 2018-08-12: 2 mg via INTRAVENOUS
  Filled 2018-08-12: qty 1

## 2018-08-12 MED ORDER — WARFARIN SODIUM 5 MG PO TABS
5.0000 mg | ORAL_TABLET | ORAL | Status: AC
  Administered 2018-08-12: 5 mg via ORAL
  Filled 2018-08-12: qty 1

## 2018-08-12 MED ORDER — WARFARIN SODIUM 7.5 MG PO TABS: 7.5000 mg | ORAL_TABLET | ORAL | Status: DC

## 2018-08-12 MED ORDER — MORPHINE SULFATE (PF) 2 MG/ML IV SOLN
2.0000 mg | INTRAVENOUS | Status: DC | PRN
  Administered 2018-08-12: 2 mg via INTRAVENOUS
  Filled 2018-08-12: qty 1

## 2018-08-12 MED ORDER — MORPHINE SULFATE (PF) 4 MG/ML IV SOLN: 3.0000 mg | INTRAVENOUS | Status: DC | PRN

## 2018-08-12 MED ORDER — FENTANYL CITRATE (PF) 100 MCG/2ML IJ SOLN
50.0000 ug | INTRAMUSCULAR | Status: DC | PRN
  Administered 2018-08-12 – 2018-08-17 (×21): 50 ug via INTRAVENOUS
  Filled 2018-08-12 (×21): qty 2

## 2018-08-12 MED ORDER — VANCOMYCIN HCL IN DEXTROSE 1-5 GM/200ML-% IV SOLN
1000.0000 mg | INTRAVENOUS | Status: DC
  Administered 2018-08-13 – 2018-08-15 (×2): 1000 mg via INTRAVENOUS
  Filled 2018-08-12 (×2): qty 200

## 2018-08-12 MED ORDER — FENTANYL CITRATE (PF) 100 MCG/2ML IJ SOLN
50.0000 ug | Freq: Once | INTRAMUSCULAR | Status: AC
  Administered 2018-08-12: 50 ug via INTRAVENOUS
  Filled 2018-08-12: qty 2

## 2018-08-12 NOTE — Progress Notes (Signed)
Paged vascular to make aware about pts left arm being swollen and hot to touch, pt c/o pain 10/10 to that same arm. No bruit or thrill present during assessment. Vascular team stated that they would come by later this afternoon and advised to call primary team for pain meds until then. Dr Aundra Dubin paged and received order to treat pain with morphine PRN. Will monitor.

## 2018-08-12 NOTE — Progress Notes (Signed)
Pharmacy Antibiotic Note  [name] is a 52 y.o. male admitted on 08/08/2018 with pneumonia.  Pharmacy has been consulted for cefepime dosing with new fever, possible infiltrate on CXR.  Today with pain/swelling at site of upper arm AV graft.  Pharmacy asked to add vancomycin.  Driveline site looks okay, recent MRSA screen negative.  Pt is ESRD, on HD MWF.    Plan: Continue cefepime 2g qHD. Add vancomycin 1750 mg x 1 now, then 1g q HD. F/u cultures, fever, clinical course.  Height: 5' 7"  (170.2 cm) Weight: 181 lb 10.5 oz (82.4 kg) IBW/kg (Calculated) : 66.1  Temp (24hrs), Avg:98.7 F (37.1 C), Min:97.7 F (36.5 C), Max:99.4 F (37.4 C)  Recent Labs  Lab 08/08/18 1248 08/09/18 0245 08/10/18 0148 08/11/18 0231 08/12/18 0311  WBC 8.2 7.8 8.3 9.7 14.5*  CREATININE 3.00* 4.46* 7.74* 5.07* 7.66*    Estimated Creatinine Clearance: 11.5 mL/min (A) (by C-G formula based on SCr of 7.66 mg/dL (H)).    Allergies  Allergen Reactions  . Metformin And Related Diarrhea    Antimicrobials this admission: Cefepime 12/7 >  Vancomycin 12/8  Dose adjustments this admission:  Microbiology results: 12/6 BCx x 2: ngtd 12/6 MRSA PCR: neg  Thank you for allowing pharmacy to be a part of this patient's care.  Marguerite Olea, [facility] Clinical Pharmacist Phone [phone]  08/12/2018 10:28 AM

## 2018-08-12 NOTE — Progress Notes (Signed)
Subjective  -   Status post left upper arm graft, now with arm swelling   Physical Exam:  Incisions are intact.  There has been a significant increase in the girth of his left upper arm.  There is edema of the left forearm and hand however this is not as pronounced as the upper arm.  He is neurovascularly intact in the left hand without numbness or motor dysfunction   Assessment/Plan:    Status post left upper arm graft now with significant swelling.  I suspect this is hematoma given his ongoing need for heparin.  The skin is not threatened at this time (this would be an indication for surgery).  Because we cannot stop the heparin, I would recommend conservative measures.  I told the patient to keep his arm as elevated as possible.  A compression wrap with an Ace bandage beginning at the fingers up to the shoulder should be placed.  We will continue to monitor him.  Hopefully this will be self-limited.  [place]  08/12/2018 3:50 PM --  Vitals:   08/12/18 0811 08/12/18 1200  BP: 106/76 (!) 121/92  Pulse:    Resp:    Temp: 97.7 F (36.5 C) 97.6 F (36.4 C)  SpO2:      Intake/Output Summary (Last 24 hours) at 08/12/2018 1550 Last data filed at 08/12/2018 0000 Gross per 24 hour  Intake 249.44 ml  Output -  Net 249.44 ml     Laboratory CBC    Component Value Date/Time   WBC 14.5 (H) 08/12/2018 0311   HGB 8.1 (L) 08/12/2018 0311   HGB 11.7 (L) 09/07/2016 1457   HCT 26.8 (L) 08/12/2018 0311   HCT 37.5 09/07/2016 1457   PLT 184 08/12/2018 0311   PLT 279 09/07/2016 1457    BMET    Component Value Date/Time   NA 132 (L) 08/12/2018 0311   NA 144 05/11/2017 0938   K 4.2 08/12/2018 0311   CL 96 (L) 08/12/2018 0311   CO2 24 08/12/2018 0311   GLUCOSE 148 (H) 08/12/2018 0311   BUN 21 (H) 08/12/2018 0311   BUN 68 (H) 05/11/2017 0938   CREATININE 7.66 (H) 08/12/2018 0311   CREATININE 1.63 (H) 11/09/2016 1505   CALCIUM 8.3 (L) 08/12/2018 0311   CALCIUM 8.7  06/27/2017 1152   GFRNONAA 7 (L) 08/12/2018 0311   GFRNONAA 48 (L) 11/09/2016 1505   GFRAA 8 (L) 08/12/2018 0311   GFRAA 55 (L) 11/09/2016 1505    COAG Lab Results  Component Value Date   INR 1.96 08/12/2018   INR 1.41 08/11/2018   INR 1.33 08/10/2018   No results found for: PTT  Antibiotics Anti-infectives (From admission, onward)   Start     Dose/Rate Route Frequency Ordered Stop   08/13/18 1200  ceFEPIme (MAXIPIME) 2 g in sodium chloride 0.9 % 100 mL IVPB     2 g 200 mL/hr over 30 Minutes Intravenous Every M-W-F (Hemodialysis) 08/11/18 1103     08/13/18 1200  vancomycin (VANCOCIN) IVPB 1000 mg/200 mL premix     1,000 mg 200 mL/hr over 60 Minutes Intravenous Every M-W-F (Hemodialysis) 08/12/18 1022     08/12/18 1030  vancomycin (VANCOCIN) 1,750 mg in sodium chloride 0.9 % 500 mL IVPB     1,750 mg 250 mL/hr over 120 Minutes Intravenous  Once 08/12/18 1018 08/12/18 1509   08/11/18 1115  ceFEPIme (MAXIPIME) 2 g in sodium chloride 0.9 % 100 mL IVPB     2  g 200 mL/hr over 30 Minutes Intravenous  Once 08/11/18 1103 08/11/18 1239   08/09/18 0000  ceFAZolin (ANCEF) IVPB 2g/100 mL premix     2 g 200 mL/hr over 30 Minutes Intravenous 30 min pre-op 08/08/18 1217 08/09/18 0654   08/08/18 1600  erythromycin (E-MYCIN) tablet 250 mg     250 mg Oral 3 times daily 08/08/18 1237         V. Leia Alf, M.D. [facility] Office: [phone] Pager:  [phone]

## 2018-08-12 NOTE — Progress Notes (Signed)
[facility] for heparin/coumadin Indication: LVAD/Factor 5   Allergies  Allergen Reactions  . Metformin And Related Diarrhea    Patient Measurements: Height: 5' 7"  (170.2 cm) Weight: 181 lb 10.5 oz (82.4 kg) IBW/kg (Calculated) : 66.1 Heparin Dosing Weight: 80.5 kg  Vital Signs: Temp: 99 F (37.2 C) (12/08 0416) Temp Source: Oral (12/08 0416) BP: 100/72 (12/08 0416)  Labs: Recent Labs    08/10/18 0148  08/11/18 0231 08/11/18 1144 08/11/18 2038 08/12/18 0311 08/12/18 0645  HGB 9.1*  --  8.8*  --   --  8.1*  --   HCT 28.6*  --  28.0*  --   --  26.8*  --   PLT 206  --  175  --   --  184  --   LABPROT 16.4*  --  17.1*  --   --  22.1*  --   INR 1.33  --  1.41  --   --  1.96  --   HEPARINUNFRC 0.23*   < > 0.29* 0.88* 0.73*  --  0.72*  CREATININE 7.74*  --  5.07*  --   --  7.66*  --    < > = values in this interval not displayed.    Estimated Creatinine Clearance: 11.5 mL/min (A) (by C-G formula based on SCr of 7.66 mg/dL (H)).   Medical History: Past Medical History:  Diagnosis Date  . Angina   . ASCVD (arteriosclerotic cardiovascular disease)    , Anterior infarction 2005, LAD diagonal bifurcation intervention 03/2004  . Automatic implantable cardiac defibrillator -[employer]. [employer]       . Benign neoplasm of colon   . CHF (congestive heart failure) ([place])   . Chronic systolic heart failure ([place])   . Coronary artery disease     Widely patent previously placed stents in the left anterior   . Crohn's disease ([place])   . 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 Dialysis patient ([place])   . Dyspnea   . Gastroparesis   . GERD (gastroesophageal reflux disease)   . High cholesterol   . Hyperlipidemia   . Hypersomnolent    Previous diagnosis of narcolepsy  . Hypertension, essential   . Ischemic cardiomyopathy    Ejection fraction 15-20% catheterization 2010  . Type II or unspecified type diabetes mellitus without mention  of complication, not stated as uncontrolled   . Unspecified gastritis and gastroduodenitis without mention of hemorrhage     Medications:  Scheduled:  . amiodarone  200 mg Oral Daily  . aspirin EC  81 mg Oral Daily  . atorvastatin  40 mg Oral q1800  . calcitRIOL  0.5 mcg Oral Q M,W,F  . Chlorhexidine Gluconate Cloth  6 each Topical Q0600  . citalopram  20 mg Oral Daily  . darbepoetin (ARANESP) injection - DIALYSIS  40 mcg Intravenous Q Fri-HD  . dronabinol  2.5 mg Oral BID AC  . erythromycin  250 mg Oral TID  . insulin aspart  0-5 Units Subcutaneous QHS  . insulin aspart  0-9 Units Subcutaneous TID WC  . insulin aspart  3 Units Subcutaneous TID WC  . insulin glargine  8 Units Subcutaneous Daily  . isosorbide-hydrALAZINE  1 tablet Oral 3 times per day on Sun Tue Thu Sat  . metoCLOPramide  10 mg Oral TID  . multivitamin  1 tablet Oral Daily  . pantoprazole  40 mg Oral BID  . sevelamer carbonate  1,600 mg Oral 2  times per day  . sevelamer carbonate  4,000 mg Oral TID WC  . sodium chloride flush  3 mL Intravenous Q12H  . traMADol  50 mg Oral Q6H  . warfarin  5 mg Oral Once per day on Sun Tue Wed Thu Fri Sat   And  . [START ON 08/13/2018] warfarin  7.5 mg Oral Q Mon-1800  . Warfarin - Pharmacist Dosing Inpatient   Does not apply q1800    Assessment: 61 yom with hx of Heartmate 3 LVAD and ESRD, plan for LUE AVF placement on 12/5. On warfarin PTA - last dose taken on 12/1. INR 1.39 on admission. Underwent creation of AVF on 12/5 followed by cardiac cath. Pt started on IV heparin bridge while INR subtherapeutic.  Heparin level this morning slightly above goal range, however INR up to 1.97.  No overt bleeding or complications noted, Hgb drifting down.  Goal of Therapy:  INR 2-3 Monitor platelets by anticoagulation protocol: Yes   Plan:  -D/c heparin gtt -Resume Coumadin PTA dose of 5 mg daily with 7.5 mg on Mondays. -Recommend INR recheck later this week if discharged  today.  Marguerite Olea, [facility] Clinical Pharmacist Phone [phone]  08/12/2018 8:00 AM

## 2018-08-12 NOTE — Progress Notes (Signed)
Paged Vascular on call Dr. Trula Slade concerning pt being extreme pain; Patient in 10/10; Provider advised to wrap with ace wrap from knuckles to shoulder; ACE wrap ordered; placed ACE wrap on; Started wrapping distal to proximal toward the heart; pt couldn't tolerate asked to remove; Got a second opinion from other nurse; Second nurse wrapped again; pt could not tolerate; asked to have removed. ACE wrapped removed. Will continue to monitor.

## 2018-08-12 NOTE — Progress Notes (Signed)
Patient ID: [name], male   DOB: [birth year], 52 y.o.   MRN: [id number]  [facility] Progress Note   Assessment/ Plan:   1. Status post left upper arm AV graft: Poor vein maturation of first stage brachiobasilic fistula noted intraoperatively prompting placement of left upper arm AV graft.  Await 3 weeks for incorporation prior to attempts at cannulation.  With increased swelling of his arm-we will reach out to vascular surgery for evaluation (likely central stenosis with ipsilateral TDC) but cannot entirely rule out hematoma over AVG bed with ongoing heparin drip/downtrending hemoglobin. 2. ESRD: Continue hemodialysis on a Monday/Wednesday/Friday schedule, will order for hemodialysis again tomorrow with cautious ultrafiltration-EDW revised to 80.5 kg based on right heart catheterization findings. 3. Anemia: Hemoglobin level trending down raising suspicion for surgical site hematoma, continue to follow on ESA. 4. CKD-MBD: Continue renal diet, phosphorus binders. 5. Nutrition: Continue renal diet, renal vitamin, minus 6.  Fever/tachycardia: Unclear source, afebrile overnight on empiric Maxipime for HCAP coverage. 7.  Chronic systolic congestive heart failure/ischemic cardiomyopathy with LVAD: INR 1.9 today while on heparin drip  Subjective:   Reports increased pain and swelling of left upper arm overnight.   Objective:   BP 106/76 (BP Location: Right Arm)   Pulse 88   Temp 97.7 F (36.5 C) (Oral)   Resp 15   Ht 5' 7"  (1.702 m)   Wt 82.4 kg   SpO2 98%   BMI 28.45 kg/m   Physical Exam: Gen: Appears uncomfortable resting in bed CVS: Pulse regular rhythm, normal rate, precordial hum audible Resp: Anteriorly clear to auscultation, no rales/rhonchi.  Left IJ TDC in situ Abd: Soft, flat, nontender Ext: Without lower extremity edema, left arm swelling noted all the way down to the hand with good capillary refill present.  Left upper arm AV graft bruit barely  audible.  Labs: BMET Recent Labs  Lab 08/08/18 1248 08/09/18 0245 08/10/18 0148 08/11/18 0231 08/12/18 0311  NA 134* 134* 134* 135 132*  K 3.2* 3.4* 3.9 4.0 4.2  CL 97* 97* 97* 99 96*  CO2 23 26 24 25 24   GLUCOSE 140* 189* 191* 100* 148*  BUN 13 22* 32* 14 21*  CREATININE 3.00* 4.46* 7.74* 5.07* 7.66*  CALCIUM 9.1 8.6* 8.0* 8.4* 8.3*   CBC Recent Labs  Lab 08/08/18 1248 08/09/18 0245 08/10/18 0148 08/11/18 0231 08/12/18 0311  WBC 8.2 7.8 8.3 9.7 14.5*  NEUTROABS 6.4  --   --   --   --   HGB 10.5* 10.0* 9.1* 8.8* 8.1*  HCT 32.7* 30.9* 28.6* 28.0* 26.8*  MCV 86.7 87.5 88.0 90.0 90.2  PLT PLATELET CLUMPS NOTED ON SMEAR, UNABLE TO ESTIMATE 195 206 175 184    @IMGRELPRIORS @ Medications:    . amiodarone  200 mg Oral Daily  . aspirin EC  81 mg Oral Daily  . atorvastatin  40 mg Oral q1800  . calcitRIOL  0.5 mcg Oral Q M,W,F  . Chlorhexidine Gluconate Cloth  6 each Topical Q0600  . citalopram  20 mg Oral Daily  . darbepoetin (ARANESP) injection - DIALYSIS  40 mcg Intravenous Q Fri-HD  . dronabinol  2.5 mg Oral BID AC  . erythromycin  250 mg Oral TID  . insulin aspart  0-5 Units Subcutaneous QHS  . insulin aspart  0-9 Units Subcutaneous TID WC  . insulin aspart  3 Units Subcutaneous TID WC  . insulin glargine  8 Units Subcutaneous Daily  . isosorbide-hydrALAZINE  1 tablet Oral 3  times per day on Sun Tue Thu Sat  . metoCLOPramide  10 mg Oral TID  . multivitamin  1 tablet Oral Daily  . pantoprazole  40 mg Oral BID  . sevelamer carbonate  1,600 mg Oral 2 times per day  . sevelamer carbonate  4,000 mg Oral TID WC  . sodium chloride flush  3 mL Intravenous Q12H  . traMADol  50 mg Oral Q6H  . warfarin  5 mg Oral Once per day on Sun Tue Wed Thu Fri Sat   And  . [START ON 08/13/2018] warfarin  7.5 mg Oral Q Mon-1800  . Warfarin - Pharmacist Dosing Inpatient   Does not apply [id number]   Elmarie Shiley, MD 08/12/2018, 8:31 AM

## 2018-08-12 NOTE — Progress Notes (Signed)
Patient ID: [name], male   DOB: [birth year], 52 y.o.   MRN: [id number]   Advanced Heart Failure VAD Team Note  PCP-Cardiologist: No primary care provider on file.   Subjective:    08/09/18 underwent US-guided cannulation of LUE AV fistula and found to be sclerotic and not suitable for dialysis. LUE graft created from brachial artery to axillary vein. Coumadin resumed. INR 1.9 this morning.   Afebrile yesterday but WBCs up to 14.  CXR with left lingular atelectasis versus infiltrate.    Last night, he developed significant swelling in his left arm and ecchymosis left inner upper arm. Very painful.   LVAD Interrogation HM 3: Speed: 5500 Flow: 3.8 PI: 4.2 Power: 4.3. 6 PI events x 24 hrs.  [place] 08/09/18  RA mean 5 RV 34/6 PA 33/9, mean 18 PCWP mean 7 Oxygen saturations: PA 70% AO 100% Cardiac Output (Fick) 6.28  Cardiac Index (Fick) 3.26 PVR 1.75 WU Cardiac Output (Thermo) 5.56 Cardiac Index (Thermo) 2.89 PVR 1.98 WU  Objective:    Vital Signs:   Temp:  [97.7 F (36.5 C)-99.4 F (37.4 C)] 97.7 F (36.5 C) (12/08 0811) Pulse Rate:  [87-88] 88 (12/07 1542) Resp:  [10-18] 15 (12/08 0416) BP: (87-106)/(58-76) 106/76 (12/08 0811) SpO2:  [98 %] 98 % (12/07 1542) Weight:  [82.4 kg] 82.4 kg (12/08 0416) Last BM Date: 08/08/18 Mean arterial Pressure 80s  Intake/Output:   Intake/Output Summary (Last 24 hours) at 08/12/2018 0938 Last data filed at 08/12/2018 0000 Gross per 24 hour  Intake 489.44 ml  Output 0 ml  Net 489.44 ml     Physical Exam    General: Well appearing this am. NAD.  HEENT: Normal. Neck: Supple, JVP 7-8 cm. Carotids OK.  Cardiac:  Mechanical heart sounds with LVAD hum present.  Lungs:  CTAB, normal effort.  Abdomen:  NT, ND, no HSM. No bruits or masses. +BS  LVAD exit site: Well-healed and incorporated. Dressing dry and intact. No erythema or drainage. Stabilization device present and accurately applied. Driveline dressing changed daily per  sterile technique. Extremities:  Left arm swollen with ecchymosis left inner upper arm and significant tenderness.  Neuro:  Alert & oriented x 3. Cranial nerves grossly intact. Moves all 4 extremities w/o difficulty. Affect pleasant     Telemetry   NSR 90s, personally reviewed.   EKG    No new tracings.    Labs   Basic Metabolic Panel: Recent Labs  Lab 08/08/18 1248 08/09/18 0245 08/10/18 0148 08/11/18 0231 08/12/18 0311  NA 134* 134* 134* 135 132*  K 3.2* 3.4* 3.9 4.0 4.2  CL 97* 97* 97* 99 96*  CO2 23 26 24 25 24   GLUCOSE 140* 189* 191* 100* 148*  BUN 13 22* 32* 14 21*  CREATININE 3.00* 4.46* 7.74* 5.07* 7.66*  CALCIUM 9.1 8.6* 8.0* 8.4* 8.3*  MG 1.6* 2.8*  --   --   --     Liver Function Tests: Recent Labs  Lab 08/08/18 1248  AST 18  ALT 19  ALKPHOS 87  BILITOT 0.6  PROT 6.8  ALBUMIN 3.2*   No results for input(s): LIPASE, AMYLASE in the last 168 hours. No results for input(s): AMMONIA in the last 168 hours.  CBC: Recent Labs  Lab 08/08/18 1248 08/09/18 0245 08/10/18 0148 08/11/18 0231 08/12/18 0311  WBC 8.2 7.8 8.3 9.7 14.5*  NEUTROABS 6.4  --   --   --   --   HGB 10.5* 10.0* 9.1* 8.8* 8.1*  HCT 32.7* 30.9* 28.6* 28.0* 26.8*  MCV 86.7 87.5 88.0 90.0 90.2  PLT PLATELET CLUMPS NOTED ON SMEAR, UNABLE TO ESTIMATE 195 206 175 184    INR: Recent Labs  Lab 08/08/18 1240 08/09/18 1050 08/10/18 0148 08/11/18 0231 08/12/18 0311  INR 1.39 1.10 1.33 1.41 1.96    Other results:  EKG:    Imaging   No results found.   Medications:     Scheduled Medications: . amiodarone  200 mg Oral Daily  . aspirin EC  81 mg Oral Daily  . atorvastatin  40 mg Oral q1800  . calcitRIOL  0.5 mcg Oral Q M,W,F  . Chlorhexidine Gluconate Cloth  6 each Topical Q0600  . citalopram  20 mg Oral Daily  . darbepoetin (ARANESP) injection - DIALYSIS  40 mcg Intravenous Q Fri-HD  . dronabinol  2.5 mg Oral BID AC  . erythromycin  250 mg Oral TID  . insulin aspart   0-5 Units Subcutaneous QHS  . insulin aspart  0-9 Units Subcutaneous TID WC  . insulin aspart  3 Units Subcutaneous TID WC  . insulin glargine  8 Units Subcutaneous Daily  . isosorbide-hydrALAZINE  1 tablet Oral 3 times per day on Sun Tue Thu Sat  . metoCLOPramide  10 mg Oral TID  .  morphine injection  2 mg Intravenous Once  . multivitamin  1 tablet Oral Daily  . pantoprazole  40 mg Oral BID  . sevelamer carbonate  1,600 mg Oral 2 times per day  . sevelamer carbonate  4,000 mg Oral TID WC  . sodium chloride flush  3 mL Intravenous Q12H  . traMADol  50 mg Oral Q6H  . warfarin  5 mg Oral Once per day on Sun Tue Wed Thu Fri Sat   And  . [START ON 08/13/2018] warfarin  7.5 mg Oral Q Mon-1800  . Warfarin - Pharmacist Dosing Inpatient   Does not apply q1800    Infusions: . sodium chloride    . sodium chloride    . [START ON 08/13/2018] ceFEPime (MAXIPIME) IV      PRN Medications: sodium chloride, acetaminophen, docusate sodium, hydrALAZINE, lidocaine-prilocaine, LORazepam, morphine injection, ondansetron (ZOFRAN) IV, promethazine, sodium chloride flush   Patient Profile   [name] is a52 y.o.with history of CAD s/p anterior MI in 2010 and NSTEMI in 3/18 with DES to pRCA and mLAD, diabetic, gastroparesis, Chrons disease, DMIIand ischemic cardiomyopathy, 10/2018s/p Heartmate 3 LVAD (emergent exchange 02/2018 due to outflow graft kink) and ESRD (HD MWF). He has LUE AVR 05/2018.  He is being admitted for peri-op management of 2nd stage of AVF.  Assessment/Plan:    1. Chronic systolic CHF: Ischemic cardiomyopathy. St Jude ICD. NSTEMI in March 2018 with DES to LAD and RCA, complicated by cardiogenic shock, low output requiring milrinone. Echo 8/18 with EF 20-25%, moderate MR. CPX (8/18) with severe functional impairment due to HF. He is s/p HM3 LVAD placement. He had pump thrombosis 6/19 due to [facility] outflow graft kinking. He had pump exchange for another HM3. RHC showed low filling  pressures and good cardiac output this admission.  - Discussed increasing HD dry weight with [name].   - Continue Bidil 1 tab TID on non-HD days.  - Continue ASA 81.  - Warfarin restarted post-procedure. INR 1.96 today, stop heparin gtt.  - He is now listed for heart/kidney at [facility]. [place] 08/09/18 with optimized filling pressures and cardiac output. Normal PA pressure.  2. ESRD: Developed peri-operatively after HM3 placement.  He is tolerating HD, currently via catheter but had LUE graft placed this admission.   - Last night, he developed painful swelling in left upper arm with ecchymosis around surgical site, this is new overnight.  ?Hematoma at site versus stenosis/occlusion.  Nursing has already discussed with VVS, to assess today. Will give meds for pain control. If hematoma, could hold warfarin tonight.  - MWF HD - As above, discussed increasing dry weight with [name] based on [place] and low flows at dialysis last week.  3. CAD: NSTEMI in 3/18. LHC with 99% ulcerated lesion proximal RCA with left to right collaterals, 95% mid LAD stenosis after mid LAD stent. s/p PCI to RCA and LAD on 11/25/16. No chest pain.   - Continue statin and ASA. 4. h/o DVTs: Factor V Leiden heterozygote.  5. Diabetic gastroparesis: Difficult to manage. He has seen gastroparesis specialist at [facility]. Surprisingly, gastric emptying study was normal. However, electrogastrogram showed poor gastric accomodation/capacity. Therefore, small frequent meals recommended. Also morning nausea thought to be GERD and Protonix was increased. Nausea seems to drive markedly high BP (resolves with nausea control).  Currently stable, no nausea.  - Continue reglan 5 mg tid/ac, chronic erythromycin, and Marinol.  - Continue Protonix BID.  - Make effort to eat small, more frequent meals.  6. DM: HgbA1c well-controlled recently. No change.   7. HTN: MAP 80s, will be getting hydralazine today (non-HD day).  Hypertension seems to  be driven by nausea (see above), so will try to minimize nausea.  8. PAD: Moderate bilateral disease on ABIs in 8/18. Denies claudication currently, no pedal ulcers. No change.   9. Atrial flutter: Paroxysmal. NSR this admission.   10. Fever: Afebrile now but WBCs up to 14. Denies Cough, chills, or driveline drainage.  CXR with possible left lingular infiltrate versus atelectasis.  WBCs not elevated. Driveline site ok. Have been treating for PNA with cefepime.  - Continue cefepime for HCAP, will transition to cefdinir at discharge.  - Will add dose of vancomycin at least today with redness/swelling left arm surgical site but think probably mechanical complication and not infection.     I reviewed the LVAD parameters from today, and compared the results to the patient's prior recorded data.  No programming changes were made.  The LVAD is functioning within specified parameters.  The patient performs LVAD self-test daily.  LVAD interrogation was negative for any significant power changes, alarms or PI events/speed drops.  LVAD equipment check completed and is in good working order.  Back-up equipment present.   LVAD education done on emergency procedures and precautions and reviewed exit site care.   Length of Stay: 4  Loralie Champagne, MD 08/12/2018, 9:38 AM  VAD Team --- VAD ISSUES ONLY--- Pager [phone] (7am - 7am)  Advanced Heart Failure Team  Pager [phone] (M-F; 7a - 4p)  Please contact [facility] for night-coverage after hours (4p -7a ) and weekends on [email or web address]

## 2018-08-13 ENCOUNTER — Other Ambulatory Visit: Payer: Self-pay

## 2018-08-13 DIAGNOSIS — Z992 Dependence on renal dialysis: Principal | ICD-10-CM

## 2018-08-13 DIAGNOSIS — N186 End stage renal disease: Secondary | ICD-10-CM

## 2018-08-13 LAB — GLUCOSE, CAPILLARY
Glucose-Capillary: 105 mg/dL — ABNORMAL HIGH (ref 70–99)
Glucose-Capillary: 111 mg/dL — ABNORMAL HIGH (ref 70–99)
Glucose-Capillary: 116 mg/dL — ABNORMAL HIGH (ref 70–99)
Glucose-Capillary: 122 mg/dL — ABNORMAL HIGH (ref 70–99)
Glucose-Capillary: 112 mg/dL — ABNORMAL HIGH (ref 70–99)

## 2018-08-13 LAB — BASIC METABOLIC PANEL
Anion gap: 17 — ABNORMAL HIGH (ref 5–15)
BUN: 32 mg/dL — ABNORMAL HIGH (ref 6–20)
CO2: 20 mmol/L — ABNORMAL LOW (ref 22–32)
Calcium: 8.1 mg/dL — ABNORMAL LOW (ref 8.9–10.3)
Chloride: 95 mmol/L — ABNORMAL LOW (ref 98–111)
Creatinine, Ser: 9.67 mg/dL — ABNORMAL HIGH (ref 0.61–1.24)
GFR calc Af Amer: 6 mL/min — ABNORMAL LOW (ref >60)
GFR calc non Af Amer: 6 mL/min — ABNORMAL LOW (ref >60)
Glucose, Bld: 127 mg/dL — ABNORMAL HIGH (ref 70–99)
Potassium: 4.6 mmol/L (ref 3.5–5.1)
Sodium: 132 mmol/L — ABNORMAL LOW (ref 135–145)

## 2018-08-13 LAB — CBC
HCT: 22.1 % — ABNORMAL LOW (ref 39.0–52.0)
Hemoglobin: 7 g/dL — ABNORMAL LOW (ref 13.0–17.0)
MCH: 28.3 pg (ref 26.0–34.0)
MCHC: 31.7 g/dL (ref 30.0–36.0)
MCV: 89.5 fL (ref 80.0–100.0)
Platelets: 189 10*3/uL (ref 150–400)
RBC: 2.47 MIL/uL — ABNORMAL LOW (ref 4.22–5.81)
RDW: 14.7 % (ref 11.5–15.5)
WBC: 15.5 10*3/uL — ABNORMAL HIGH (ref 4.0–10.5)
nRBC: 0 % (ref 0.0–0.2)

## 2018-08-13 LAB — PREPARE RBC (CROSSMATCH)

## 2018-08-13 LAB — LACTATE DEHYDROGENASE: LDH: 166 U/L (ref 98–192)

## 2018-08-13 LAB — PROTIME-INR
INR: 3.27
Prothrombin Time: 32.8 seconds — ABNORMAL HIGH (ref 11.4–15.2)

## 2018-08-13 MED ORDER — CALCITRIOL 0.5 MCG PO CAPS
ORAL_CAPSULE | ORAL | Status: AC
  Administered 2018-08-13: 0.5 ug via ORAL
  Filled 2018-08-13: qty 1

## 2018-08-13 MED ORDER — TRAMADOL HCL 50 MG PO TABS
50.0000 mg | ORAL_TABLET | Freq: Four times a day (QID) | ORAL | Status: DC | PRN
  Administered 2018-08-13 – 2018-08-16 (×6): 50 mg via ORAL
  Filled 2018-08-13 (×7): qty 1

## 2018-08-13 MED ORDER — FENTANYL CITRATE (PF) 100 MCG/2ML IJ SOLN
INTRAMUSCULAR | Status: AC
  Administered 2018-08-13: 50 ug via INTRAVENOUS
  Filled 2018-08-13: qty 2

## 2018-08-13 MED ORDER — SODIUM CHLORIDE 0.9% IV SOLUTION: Freq: Once | INTRAVENOUS | Status: DC

## 2018-08-13 MED ORDER — HEPARIN SODIUM (PORCINE) 1000 UNIT/ML IJ SOLN
INTRAMUSCULAR | Status: AC
  Administered 2018-08-13: 1000 [IU]
  Filled 2018-08-13: qty 4

## 2018-08-13 NOTE — Progress Notes (Signed)
   I evaluated patient's arm which appears to be edematous and painful with limited range of motion although his hand is well-perfused and sensorimotor intact.  I cannot identify whether or not the graft is remained patent.  Unfortunately his INR would be prohibitive to further operation at this time.  I will reevaluate him tomorrow and if he is still having significant symptoms we may need to reverse his INR and evacuate his hematoma.  Unfortunately this will continue to be an issue given the need for ongoing anticoagulation.  Servando Snare, MD

## 2018-08-13 NOTE — Progress Notes (Addendum)
[place] for heparin/coumadin Indication: LVAD/Factor 5   Allergies  Allergen Reactions  . Metformin And Related Diarrhea    Patient Measurements: Height: 5' 7"  (170.2 cm) Weight: 181 lb 3.5 oz (82.2 kg) IBW/kg (Calculated) : 66.1 Heparin Dosing Weight: 80.5 kg  Vital Signs: Temp: 98 F (36.7 C) (12/09 0959) Temp Source: Oral (12/09 0959) BP: 95/51 (12/09 0959) Pulse Rate: 94 (12/09 0959)  Labs: Recent Labs    08/11/18 0231 08/11/18 1144 08/11/18 2038 08/12/18 0311 08/12/18 0645 08/13/18 0300  HGB 8.8*  --   --  8.1*  --  7.0*  HCT 28.0*  --   --  26.8*  --  22.1*  PLT 175  --   --  184  --  189  LABPROT 17.1*  --   --  22.1*  --  32.8*  INR 1.41  --   --  1.96  --  3.27  HEPARINUNFRC 0.29* 0.88* 0.73*  --  0.72*  --   CREATININE 5.07*  --   --  7.66*  --  9.67*    Estimated Creatinine Clearance: 9.1 mL/min (A) (by C-G formula based on SCr of 9.67 mg/dL (H)).   Medical History: Past Medical History:  Diagnosis Date  . Angina   . ASCVD (arteriosclerotic cardiovascular disease)    , Anterior infarction 2005, LAD diagonal bifurcation intervention 03/2004  . Automatic implantable cardiac defibrillator -St. Jude's       . Benign neoplasm of colon   . CHF (congestive heart failure) ([place])   . Chronic systolic heart failure ([place])   . Coronary artery disease     Widely patent previously placed stents in the left anterior   . Crohn's disease ([place])   . 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 Dialysis patient ([place])   . Dyspnea   . Gastroparesis   . GERD (gastroesophageal reflux disease)   . High cholesterol   . Hyperlipidemia   . Hypersomnolent    Previous diagnosis of narcolepsy  . Hypertension, essential   . Ischemic cardiomyopathy    Ejection fraction 15-20% catheterization 2010  . Type II or unspecified type diabetes mellitus without mention of complication, not stated as uncontrolled   .  Unspecified gastritis and gastroduodenitis without mention of hemorrhage     Assessment: 14 yom with hx of Heartmate 3 LVAD and ESRD, plan for LUE AVF placement on 12/5. On warfarin PTA - last dose taken on 12/1. INR 1.39 on admission. Underwent creation of AVF on 12/5 followed by cardiac cath. Pt started on IV heparin bridge while INR subtherapeutic.  INR up to 3.27 this am, hgb down to 7. Will receive 1 unit this am. Pltc stable. Will hold warfarin tonight, heparin turned off yesterday. Suspected LUE hematoma, seen by VVS.  Goal of Therapy:  INR 2-3 Monitor platelets by anticoagulation protocol: Yes   Plan:  -Hold warfarin tonight -Recommend INR recheck later this week if discharged today.  Erin Hearing PharmD., BCPS Clinical Pharmacist 08/13/2018 10:19 AM

## 2018-08-13 NOTE — Progress Notes (Addendum)
[facility] Progress Note   Subjective:  Seen on HD, 2L UF ordered - seems to be tolerating. No CP/dyspnea. L arm remains edematous, VVS feels likely hematoma - conservative measures planned. No vomiting.  Objective Vitals:   08/13/18 0959 08/13/18 1015 08/13/18 1030 08/13/18 1100  BP: (!) 95/51 109/65 100/61 107/64  Pulse: 94 90 89 89  Resp: 16 18 16    Temp: 98 F (36.7 C) 98.2 F (36.8 C) 97.8 F (36.6 C)   TempSrc: Oral Oral Oral   SpO2:      Weight:      Height:       Physical Exam General: Well appearing man, NAD Heart: Continuous LAD hum Lungs: CTA anteriorly Extremities: No LE edema Dialysis Access: TDC in use, new LUE AVG + arm edema  Additional Objective Labs: Basic Metabolic Panel: Recent Labs  Lab 08/11/18 0231 08/12/18 0311 08/13/18 0300  NA 135 132* 132*  K 4.0 4.2 4.6  CL 99 96* 95*  CO2 25 24 20*  GLUCOSE 100* 148* 127*  BUN 14 21* 32*  CREATININE 5.07* 7.66* 9.67*  CALCIUM 8.4* 8.3* 8.1*   Liver Function Tests: Recent Labs  Lab 08/08/18 1248  AST 18  ALT 19  ALKPHOS 87  BILITOT 0.6  PROT 6.8  ALBUMIN 3.2*   CBC: Recent Labs  Lab 08/08/18 1248 08/09/18 0245 08/10/18 0148 08/11/18 0231 08/12/18 0311 08/13/18 0300  WBC 8.2 7.8 8.3 9.7 14.5* 15.5*  NEUTROABS 6.4  --   --   --   --   --   HGB 10.5* 10.0* 9.1* 8.8* 8.1* 7.0*  HCT 32.7* 30.9* 28.6* 28.0* 26.8* 22.1*  MCV 86.7 87.5 88.0 90.0 90.2 89.5  PLT PLATELET CLUMPS NOTED ON SMEAR, UNABLE TO ESTIMATE 195 206 175 184 189   Blood Culture    Component Value Date/Time   SDES BLOOD SITE NOT SPECIFIED 08/10/2018 0906   SPECREQUEST  08/10/2018 0906    Blood Culture adequate volume BOTTLES DRAWN AEROBIC ONLY   CULT  08/10/2018 0906    NO GROWTH 2 DAYS Performed at [facility], [place] [address], [place], [place] [address]    REPTSTATUS PENDING 08/10/2018 5597   Medications: . sodium chloride    . sodium chloride 10 mL/hr at 08/12/18 1800  . ceFEPime  (MAXIPIME) IV    . vancomycin 1,000 mg (08/13/18 1012)   . sodium chloride   Intravenous Once  . amiodarone  200 mg Oral Daily  . atorvastatin  40 mg Oral q1800  . calcitRIOL  0.5 mcg Oral Q M,W,F  . Chlorhexidine Gluconate Cloth  6 each Topical Q0600  . citalopram  20 mg Oral Daily  . darbepoetin (ARANESP) injection - DIALYSIS  40 mcg Intravenous Q Fri-HD  . dronabinol  2.5 mg Oral BID AC  . erythromycin  250 mg Oral TID  . insulin aspart  0-5 Units Subcutaneous QHS  . insulin aspart  0-9 Units Subcutaneous TID WC  . insulin aspart  3 Units Subcutaneous TID WC  . insulin glargine  8 Units Subcutaneous Daily  . isosorbide-hydrALAZINE  1 tablet Oral 3 times per day on Sun Tue Thu Sat  . metoCLOPramide  10 mg Oral TID  . multivitamin  1 tablet Oral Daily  . pantoprazole  40 mg Oral BID  . sevelamer carbonate  1,600 mg Oral 2 times per day  . sevelamer carbonate  4,000 mg Oral TID WC  . sodium chloride flush  3 mL Intravenous  Q12H  . traMADol  50 mg Oral Q6H  . Warfarin - Pharmacist Dosing Inpatient   Does not apply q1800    Dialysis:  MWF [facility] 4h 8mn 400/800  80kg   2K/3Ca bath   Hep none  TDC/ new LUA AVG (12/5, maturing) - Calcitriol 176m PO q HD - No ESA recently, Hgb > 11  Assessment/Plan: 1. Dialysis access:S/p LUE AVG 12/5 by Dr. CaDonzetta MattersDeveloped large hematoma; VVS rec elevation/wraps. WBC higher - on Vanc/Cefepime empirically. 2. ESRD:Continue HD per usual MWF schedule. Not reaching EDW, but no signs of overload - will plan to raise EDW very slightly (80.5kg). 3. Hypertension/volume:BP controlled, no edema on exam  4. Anemia:Hgb down to 7 (surg site hematoma), last given Aranesp 12/6. 1U PRBCs given today. 5. Metabolic bone disease:Ca ok, Phos pending. Continue home binders. 6. T2DM: Per primary 7. CAD/severe ICM with Heartmate 3 LVAD: Per HF team.  KaVeneta PentonPA-C 08/13/2018, 11:51 AM  [facility] Pager: [phone] seen,  examined and agree w A/P as above.  RoKelly SplinterD CaNewell Rubbermaidager [phone] 08/13/2018, 12:01 PM

## 2018-08-13 NOTE — Progress Notes (Signed)
LVAD Coordinator Rounding Note:  Pt admitted 08/08/18 for preoperative anticoagulation management for 2nd state of LUE AV fistula (scheduled 08/09/18.)   HM III LVAD implanted on 06/12/17 by Dr. Prescott Gum under Destination Therapy criteria due to renal insufficiency. Pump exchange on 03/01/18 for outflow graft twist/obstruction.   Pt in bed this afternoon following dialysis treatment. Pt continues to c/o left arm pain. Arm is swollen, hot and extremely painful.  Vital signs: Temp: 99 HR:  87 Doppler Pressure: 92 Auto BP: 115/87 (97) O2 Sat: 98% on RA Wt: 80.5>80.3>82.3>80.1kg  LVAD interrogation reveals:  Speed: 5500 Flow: 4.0 Power: 4.4 w PI: 3.5  Alarms: none Events: none Hematocrit: 22 Fixed speed: 5500 Low speed limit: 5200  Drive Line:  CDI. Weekly dressing kits. Next dressing is due 08/17/18.  Labs: LDH trend: 238>196>175>166  INR trend: 1.39>1.33>3.27  Blood Products:  08/13/18>> 1 u/PRBC   Anticoagulation Plan: - INR Goal: 2.0 - 3.0 - ASA Dose: 81 mg daily   Device: -St Jude dual  -Therapies: on 200 bpm - Monitor: on VT 150 bpm  Adverse Events on VAD: - ESRD post LVAD; started CVVH 06/15/17; HD started 06/20/17 - Hospitalization 11/5 - 07/16/17 > gastroparesis diagnosis after EGD - Hospitalization 11/26 - 08/04/17 > gastroparesis - Hospitalization 1/8 - 09/16/17>gastroparesis - referred to Dr. Corliss Parish at [facility] - 10/31/17>rightupper arm arteriovenous graft with Artegraft; ligation of right first stage brachial vein transposition  - 12/11/13 elective admission for thrombectomy for AV graft in RUA  - 12/20/17 > intractable N/V - 01/19/18 > intractable N/V sent by EMS from dialysis center - 03/01/18 > outflow graft twist/occulsion requiring pump exchange - 05/23/18 > admitted for non-working fistula in RUA - 06/10/18 > admitted for intractable N/V   Plan/Recommendations:  1. Please page VAD coordinator with any equipment questions or concerns. 2. Providers aware of  left arm progression.   [name] RN [place] Coordinator  Office: [phone]  24/7 Pager: [phone]

## 2018-08-13 NOTE — Progress Notes (Signed)
Patient ID: [name], male   DOB: February 15, 1966, 52 y.o.   MRN: [id number]   Advanced Heart Failure VAD Team Note  PCP-Cardiologist: No primary care provider on file.   Subjective:    08/09/18 underwent US-guided cannulation of LUE AV fistula and found to be sclerotic and not suitable for dialysis. LUE graft created from brachial artery to axillary vein. Coumadin resumed. INR up to 3.27.   Afebrile yesterday but WBCs up to 15.  CXR with left lingular atelectasis versus infiltrate.    12/7, he developed significant swelling in his left arm and ecchymosis left inner upper arm. Very painful. Seen by VVS, suspect hematoma.  Recommended elevation and wrapping, but wrapping was too painful for him.    LVAD Interrogation HM 3: Speed: 5500 Flow: 4 PI: 3.5 Power: 4.4.   [place] 08/09/18  RA mean 5 RV 34/6 PA 33/9, mean 18 PCWP mean 7 Oxygen saturations: PA 70% AO 100% Cardiac Output (Fick) 6.28  Cardiac Index (Fick) 3.26 PVR 1.75 WU Cardiac Output (Thermo) 5.56 Cardiac Index (Thermo) 2.89 PVR 1.98 WU  Objective:    Vital Signs:   Temp:  [97.6 F (36.4 C)-98.3 F (36.8 C)] 97.6 F (36.4 C) (12/09 0710) Pulse Rate:  [81-93] 89 (12/09 0800) Resp:  [19] 19 (12/09 0533) BP: (98-121)/(56-92) 108/56 (12/09 0800) SpO2:  [98 %] 98 % (12/09 0710) Weight:  [82.2 kg] 82.2 kg (12/09 0710) Last BM Date: 08/08/18 Mean arterial Pressure 80s  Intake/Output:   Intake/Output Summary (Last 24 hours) at 08/13/2018 0821 Last data filed at 08/12/2018 1800 Gross per 24 hour  Intake 512.34 ml  Output -  Net 512.34 ml     Physical Exam    General: Well appearing this am. NAD.  HEENT: Normal. Neck: Supple, JVP 7-8 cm. Carotids OK.  Cardiac:  Mechanical heart sounds with LVAD hum present.  Lungs:  CTAB, normal effort.  Abdomen:  NT, ND, no HSM. No bruits or masses. +BS  LVAD exit site: Well-healed and incorporated. Dressing dry and intact. No erythema or drainage. Stabilization device  present and accurately applied. Driveline dressing changed daily per sterile technique. Extremities:  Warm and dry. No cyanosis, clubbing, rash.  Left arm swollen with ecchymosis around site of AV graft.   Neuro:  Alert & oriented x 3. Cranial nerves grossly intact. Moves all 4 extremities w/o difficulty. Affect pleasant     Telemetry   NSR 90s, personally reviewed.   EKG    No new tracings.    Labs   Basic Metabolic Panel: Recent Labs  Lab 08/08/18 1248 08/09/18 0245 08/10/18 0148 08/11/18 0231 08/12/18 0311 08/13/18 0300  NA 134* 134* 134* 135 132* 132*  K 3.2* 3.4* 3.9 4.0 4.2 4.6  CL 97* 97* 97* 99 96* 95*  CO2 23 26 24 25 24  20*  GLUCOSE 140* 189* 191* 100* 148* 127*  BUN 13 22* 32* 14 21* 32*  CREATININE 3.00* 4.46* 7.74* 5.07* 7.66* 9.67*  CALCIUM 9.1 8.6* 8.0* 8.4* 8.3* 8.1*  MG 1.6* 2.8*  --   --   --   --     Liver Function Tests: Recent Labs  Lab 08/08/18 1248  AST 18  ALT 19  ALKPHOS 87  BILITOT 0.6  PROT 6.8  ALBUMIN 3.2*   No results for input(s): LIPASE, AMYLASE in the last 168 hours. No results for input(s): AMMONIA in the last 168 hours.  CBC: Recent Labs  Lab 08/08/18 1248 08/09/18 0245 08/10/18 0148 08/11/18 0231  08/12/18 0311 08/13/18 0300  WBC 8.2 7.8 8.3 9.7 14.5* 15.5*  NEUTROABS 6.4  --   --   --   --   --   HGB 10.5* 10.0* 9.1* 8.8* 8.1* 7.0*  HCT 32.7* 30.9* 28.6* 28.0* 26.8* 22.1*  MCV 86.7 87.5 88.0 90.0 90.2 89.5  PLT PLATELET CLUMPS NOTED ON SMEAR, UNABLE TO ESTIMATE 195 206 175 184 189    INR: Recent Labs  Lab 08/09/18 1050 08/10/18 0148 08/11/18 0231 08/12/18 0311 08/13/18 0300  INR 1.10 1.33 1.41 1.96 3.27    Other results:  EKG:    Imaging   No results found.   Medications:     Scheduled Medications: . amiodarone  200 mg Oral Daily  . aspirin EC  81 mg Oral Daily  . atorvastatin  40 mg Oral q1800  . calcitRIOL  0.5 mcg Oral Q M,W,F  . Chlorhexidine Gluconate Cloth  6 each Topical Q0600  .  citalopram  20 mg Oral Daily  . darbepoetin (ARANESP) injection - DIALYSIS  40 mcg Intravenous Q Fri-HD  . dronabinol  2.5 mg Oral BID AC  . erythromycin  250 mg Oral TID  . insulin aspart  0-5 Units Subcutaneous QHS  . insulin aspart  0-9 Units Subcutaneous TID WC  . insulin aspart  3 Units Subcutaneous TID WC  . insulin glargine  8 Units Subcutaneous Daily  . isosorbide-hydrALAZINE  1 tablet Oral 3 times per day on Sun Tue Thu Sat  . metoCLOPramide  10 mg Oral TID  . multivitamin  1 tablet Oral Daily  . pantoprazole  40 mg Oral BID  . sevelamer carbonate  1,600 mg Oral 2 times per day  . sevelamer carbonate  4,000 mg Oral TID WC  . sodium chloride flush  3 mL Intravenous Q12H  . traMADol  50 mg Oral Q6H  . warfarin  7.5 mg Oral Q Mon-1800  . Warfarin - Pharmacist Dosing Inpatient   Does not apply q1800    Infusions: . sodium chloride    . sodium chloride 10 mL/hr at 08/12/18 1800  . ceFEPime (MAXIPIME) IV    . vancomycin      PRN Medications: sodium chloride, acetaminophen, docusate sodium, fentaNYL (SUBLIMAZE) injection, hydrALAZINE, lidocaine-prilocaine, LORazepam, ondansetron (ZOFRAN) IV, promethazine, sodium chloride flush   Patient Profile   [name] is a52 y.o.with history of CAD s/p anterior MI in 2010 and NSTEMI in 3/18 with DES to pRCA and mLAD, diabetic, gastroparesis, Chrons disease, DMIIand ischemic cardiomyopathy, 10/2018s/p Heartmate 3 LVAD (emergent exchange 02/2018 due to outflow graft kink) and ESRD (HD MWF). He has LUE AVR 05/2018.  He is being admitted for peri-op management of 2nd stage of AVF.  Assessment/Plan:    1. Chronic systolic CHF: Ischemic cardiomyopathy. St Jude ICD. NSTEMI in March 2018 with DES to LAD and RCA, complicated by cardiogenic shock, low output requiring milrinone. Echo 8/18 with EF 20-25%, moderate [name]. CPX (8/18) with severe functional impairment due to HF. He is s/p HM3 LVAD placement. He had pump thrombosis 6/19 due to [facility]  outflow graft kinking. He had pump exchange for another HM3. RHC showed low filling pressures and good cardiac output this admission.  - Discussed increasing HD dry weight with Dr. Posey Pronto.   - Continue Bidil 1 tab TID on non-HD days.  - Hold ASA for now with hematoma, restart prior to discharge.   - Warfarin restarted post-procedure. INR 3.2 today, will need to hold.  He is off heparin gtt.   -  He is now listed for heart/kidney at [facility]. [place] 08/09/18 with optimized filling pressures and cardiac output. Normal PA pressure.  2. ESRD: Developed peri-operatively after HM3 placement. He is tolerating HD, currently via catheter but had LUE graft placed this admission.  Now with pain and swelling left arm post-AV LUE graft placement.  VVS saw, suspects hematoma.  - VVS recommends conservative tx for now, keep left arm elevated and ACE wraps.  However, patient does not want wraps due to pain.  Will need to let INR drift back down and will also hold ASA for now.  - MWF HD - As above, discussed increasing dry weight with Dr. Posey Pronto based on Delta and low flows at dialysis last week.  3. CAD: NSTEMI in 3/18. LHC with 99% ulcerated lesion proximal RCA with left to right collaterals, 95% mid LAD stenosis after mid LAD stent. s/p PCI to RCA and LAD on 11/25/16. No chest pain.   - Continue statin. 4. h/o DVTs: Factor V Leiden heterozygote.  5. Diabetic gastroparesis: Difficult to manage. He has seen gastroparesis specialist at [facility]. Surprisingly, gastric emptying study was normal. However, electrogastrogram showed poor gastric accomodation/capacity. Therefore, small frequent meals recommended. Also morning nausea thought to be GERD and Protonix was increased. Nausea seems to drive markedly high BP (resolves with nausea control).  Currently stable, no nausea.  - Continue reglan 5 mg tid/ac, chronic erythromycin, and Marinol.  - Continue Protonix BID.  - Make effort to eat small, more frequent meals.  6.  DM: HgbA1c well-controlled recently. No change.   7. HTN: MAP 80s, will be getting hydralazine today (non-HD day).  Hypertension seems to be driven by nausea (see above), so will try to minimize nausea.  8. PAD: Moderate bilateral disease on ABIs in 8/18. Denies claudication currently, no pedal ulcers. No change.   9. Atrial flutter: Paroxysmal. NSR this admission.   10. Fever: Afebrile now but WBCs up to 15 but may be related to hematoma. Denies cough, chills, or driveline drainage.  CXR with possible left lingular infiltrate versus atelectasis.  WBCs not elevated. Driveline site ok. Have been treating for PNA with cefepime/vancomycin.  - Continue cefepime/vancomycin for HCAP, will transition to cefdinir at discharge.  11. Anemia: Hgb down to 7 today.  May be due to bleeding at LUE graft site (hematoma).   - Transfuse 1 unit PRBCs (getting HD today).     I reviewed the LVAD parameters from today, and compared the results to the patient's prior recorded data.  No programming changes were made.  The LVAD is functioning within specified parameters.  The patient performs LVAD self-test daily.  LVAD interrogation was negative for any significant power changes, alarms or PI events/speed drops.  LVAD equipment check completed and is in good working order.  Back-up equipment present.   LVAD education done on emergency procedures and precautions and reviewed exit site care.   Length of Stay: 5  Loralie Champagne, MD 08/13/2018, 8:21 AM  VAD Team --- VAD ISSUES ONLY--- Pager [phone] (7am - 7am)  Advanced Heart Failure Team  Pager [phone] (M-F; 7a - 4p)  Please contact [facility] for night-coverage after hours (4p -7a ) and weekends on [email or web address]

## 2018-08-13 NOTE — Progress Notes (Signed)
    Called to reassess LUE edema.  LUE Hematoma.   Per nursing. Edema in LUE forearm and fingers decreased today. He is intolerant compression wrap and having a hard time with elevation.   LUE warm. Sensation intact. Fingers with minimal edema. Forearm soft. Left upper arm edema remains present.   Holding coumadin today. INR 3.7.   Try to elevate on an extra pillow. Discussed with Dr Aundra Dubin.   [name] Hyson NP-C  2:28 PM

## 2018-08-14 ENCOUNTER — Encounter (HOSPITAL_COMMUNITY): Payer: Self-pay

## 2018-08-14 LAB — CBC
HCT: 29 % — ABNORMAL LOW (ref 39.0–52.0)
Hemoglobin: 9.2 g/dL — ABNORMAL LOW (ref 13.0–17.0)
MCH: 28.6 pg (ref 26.0–34.0)
MCHC: 31.7 g/dL (ref 30.0–36.0)
MCV: 90.1 fL (ref 80.0–100.0)
PLATELETS: 241 10*3/uL (ref 150–400)
RBC: 3.22 MIL/uL — ABNORMAL LOW (ref 4.22–5.81)
RDW: 14.9 % (ref 11.5–15.5)
WBC: 15.3 10*3/uL — ABNORMAL HIGH (ref 4.0–10.5)
nRBC: 0.3 % — ABNORMAL HIGH (ref 0.0–0.2)

## 2018-08-14 LAB — BASIC METABOLIC PANEL
Anion gap: 15 (ref 5–15)
BUN: 19 mg/dL (ref 6–20)
CO2: 26 mmol/L (ref 22–32)
CREATININE: 5.85 mg/dL — AB (ref 0.61–1.24)
Calcium: 8.4 mg/dL — ABNORMAL LOW (ref 8.9–10.3)
Chloride: 95 mmol/L — ABNORMAL LOW (ref 98–111)
GFR calc Af Amer: 12 mL/min — ABNORMAL LOW (ref >60)
GFR calc non Af Amer: 10 mL/min — ABNORMAL LOW (ref >60)
Glucose, Bld: 99 mg/dL (ref 70–99)
Potassium: 4.2 mmol/L (ref 3.5–5.1)
Sodium: 136 mmol/L (ref 135–145)

## 2018-08-14 LAB — PROTIME-INR
INR: 4
PROTHROMBIN TIME: 38.4 s — AB (ref 11.4–15.2)

## 2018-08-14 LAB — GLUCOSE, CAPILLARY
Glucose-Capillary: 132 mg/dL — ABNORMAL HIGH (ref 70–99)
Glucose-Capillary: 183 mg/dL — ABNORMAL HIGH (ref 70–99)
Glucose-Capillary: 171 mg/dL — ABNORMAL HIGH (ref 70–99)

## 2018-08-14 LAB — BPAM RBC
BLOOD PRODUCT EXPIRATION DATE: 201912312359
ISSUE DATE / TIME: 201912090951
Unit Type and Rh: 5100

## 2018-08-14 LAB — TYPE AND SCREEN
ABO/RH(D): O POS
Antibody Screen: NEGATIVE
Unit division: 0

## 2018-08-14 LAB — LACTATE DEHYDROGENASE: LDH: 171 U/L (ref 98–192)

## 2018-08-14 MED ORDER — ENSURE ENLIVE PO LIQD
237.0000 mL | Freq: Two times a day (BID) | ORAL | Status: DC
  Administered 2018-08-14 – 2018-08-17 (×7): 237 mL via ORAL

## 2018-08-14 MED ORDER — CHLORHEXIDINE GLUCONATE CLOTH 2 % EX PADS
6.0000 | MEDICATED_PAD | Freq: Every day | CUTANEOUS | Status: DC
  Administered 2018-08-15 – 2018-08-17 (×3): 6 via TOPICAL

## 2018-08-14 MED ORDER — DOCUSATE SODIUM 100 MG PO CAPS
100.0000 mg | ORAL_CAPSULE | Freq: Two times a day (BID) | ORAL | Status: DC
  Administered 2018-08-14 – 2018-08-18 (×8): 100 mg via ORAL
  Filled 2018-08-14 (×8): qty 1

## 2018-08-14 MED ORDER — POLYETHYLENE GLYCOL 3350 17 G PO PACK
17.0000 g | PACK | Freq: Every day | ORAL | Status: DC
  Administered 2018-08-14 – 2018-08-17 (×4): 17 g via ORAL
  Filled 2018-08-14 (×5): qty 1

## 2018-08-14 NOTE — Plan of Care (Signed)
  Problem: Education: Goal: Patient will be able to verbalize current INR target range and antiplatelet therapy for discharge home Outcome: Progressing   Problem: Clinical Measurements: Goal: Ability to maintain clinical measurements within normal limits will improve Outcome: Progressing Goal: Will remain free from infection Outcome: Progressing Goal: Diagnostic test results will improve Outcome: Progressing Goal: Cardiovascular complication will be avoided Outcome: Progressing   Problem: Activity: Goal: Risk for activity intolerance will decrease Outcome: Progressing   Problem: Pain Managment: Goal: General experience of comfort will improve Outcome: Progressing   Problem: Safety: Goal: Ability to remain free from injury will improve Outcome: Progressing   Problem: Skin Integrity: Goal: Risk for impaired skin integrity will decrease Outcome: Progressing

## 2018-08-14 NOTE — Progress Notes (Signed)
LVAD Coordinator Rounding Note:  Pt admitted 08/08/18 for preoperative anticoagulation management for 2nd state of LUE AV fistula (scheduled 08/09/18.)   HM III LVAD implanted on 06/12/17 by Dr. Prescott Gum under Destination Therapy criteria due to renal insufficiency. Pump exchange on 03/01/18 for outflow graft twist/obstruction.   Pt in bed this morning. His left arm is not as swollen today and pt reports that his pain is better today.  Vital signs: Temp: 99.1 HR:  86 Doppler Pressure: 96 Auto BP: 100/79 (87) O2 Sat: 94% on RA Wt: 80.5>80.3>82.3>80.1>79.9kg  LVAD interrogation reveals:  Speed: 5500 Flow: 4.4 Power: 4.4 w PI: 3.2  Alarms: none Events: none Hematocrit: 29 Fixed speed: 5500 Low speed limit: 5200  Drive Line:  CDI. Weekly dressing kits. Next dressing is due 08/17/18.  Labs: LDH trend: 238>196>175>166>171  INR trend: 1.39>1.33>3.27>1.96  Blood Products:  08/13/18>> 1 u/PRBC   Anticoagulation Plan: - INR Goal: 2.0 - 3.0 - ASA Dose: 81 mg daily   Device: -St Jude dual  -Therapies: on 200 bpm - Monitor: on VT 150 bpm  Adverse Events on VAD: - ESRD post LVAD; started CVVH 06/15/17; HD started 06/20/17 - Hospitalization 11/5 - 07/16/17 > gastroparesis diagnosis after EGD - Hospitalization 11/26 - 08/04/17 > gastroparesis - Hospitalization 1/8 - 09/16/17>gastroparesis - referred to Dr. Corliss Parish at [facility] - 10/31/17>rightupper arm arteriovenous graft with Artegraft; ligation of right first stage brachial vein transposition  - 12/11/13 elective admission for thrombectomy for AV graft in RUA  - 12/20/17 > intractable N/V - 01/19/18 > intractable N/V sent by EMS from dialysis center - 03/01/18 > outflow graft twist/occulsion requiring pump exchange - 05/23/18 > admitted for non-working fistula in RUA - 06/10/18 > admitted for intractable N/V   Plan/Recommendations:  1. Please page VAD coordinator with any equipment questions or concerns.  [name] RN [place]  Coordinator  Office: [phone]  24/7 Pager: [phone]

## 2018-08-14 NOTE — Plan of Care (Signed)
  Problem: Pain Managment: Goal: General experience of comfort will improve Outcome: Progressing   Problem: Safety: Goal: Ability to remain free from injury will improve Outcome: Progressing   Problem: Skin Integrity: Goal: Risk for impaired skin integrity will decrease Outcome: Progressing   

## 2018-08-14 NOTE — Care Management Note (Signed)
Case Management Note  Patient Details  Name: [name] MRN: [id number] Date of Birth: 12/08/1965  Subjective/Objective:   Pt admitted for HD fistula repair - hx of LVAD with multiple admits related to LVAD complications              Action/Plan:   PTA independent from home on outpt HD.  Pt has PCP and active insurance   Expected Discharge Date:                  Expected Discharge Plan:  Home/Self Care(Independent from home, has PCP)  In-House Referral:     Discharge planning Services     Post Acute Care Choice:    Choice offered to:     DME Arranged:    DME Agency:     HH Arranged:    HH Agency:     Status of Service:     If discussed at [employer], dates discussed:    Additional Comments: 08/14/2018 Pt is now s/p new graft for HD - unfortunately pt is now experiencing complications with new graft.  Plan is for a hematoma evacuation once INR <1.8.  CM will continue to follow Maryclare Labrador, RN 08/14/2018, 9:11 AM

## 2018-08-14 NOTE — Progress Notes (Signed)
[facility] Progress Note   Subjective:  Seen in room, no c/o  Objective Vitals:   08/14/18 0742 08/14/18 1157 08/14/18 1200 08/14/18 1513  BP: 100/79 98/82 98/82  (!) 80/55  Pulse: 84 90  90  Resp: 11 14  (!) 22  Temp: 99.1 F (37.3 C) 98.8 F (37.1 C)  98.1 F (36.7 C)  TempSrc: Oral Oral  Oral  SpO2: 94% 95%  94%  Weight:      Height:       Physical Exam General: Well appearing man, NAD Heart: Continuous LAD hum Lungs: CTA anteriorly Extremities: No LE edema Dialysis Access: TDC in use, new LUE AVG + arm edema  Additional Objective Labs: Basic Metabolic Panel: Recent Labs  Lab 08/12/18 0311 08/13/18 0300 08/14/18 0245  NA 132* 132* 136  K 4.2 4.6 4.2  CL 96* 95* 95*  CO2 24 20* 26  GLUCOSE 148* 127* 99  BUN 21* 32* 19  CREATININE 7.66* 9.67* 5.85*  CALCIUM 8.3* 8.1* 8.4*   Liver Function Tests: Recent Labs  Lab 08/08/18 1248  AST 18  ALT 19  ALKPHOS 87  BILITOT 0.6  PROT 6.8  ALBUMIN 3.2*   CBC: Recent Labs  Lab 08/08/18 1248  08/10/18 0148 08/11/18 0231 08/12/18 0311 08/13/18 0300 08/14/18 0245  WBC 8.2   < > 8.3 9.7 14.5* 15.5* 15.3*  NEUTROABS 6.4  --   --   --   --   --   --   HGB 10.5*   < > 9.1* 8.8* 8.1* 7.0* 9.2*  HCT 32.7*   < > 28.6* 28.0* 26.8* 22.1* 29.0*  MCV 86.7   < > 88.0 90.0 90.2 89.5 90.1  PLT PLATELET CLUMPS NOTED ON SMEAR, UNABLE TO ESTIMATE   < > 206 175 184 189 241   < > = values in this interval not displayed.   Blood Culture    Component Value Date/Time   SDES BLOOD SITE NOT SPECIFIED 08/10/2018 0906   SPECREQUEST  08/10/2018 0906    Blood Culture adequate volume BOTTLES DRAWN AEROBIC ONLY   CULT  08/10/2018 0906    NO GROWTH 4 DAYS Performed at [facility], [place] [address], [place], [place] [address]    REPTSTATUS PENDING 08/10/2018 9675   Medications: . sodium chloride    . sodium chloride 10 mL/hr at 08/14/18 0600  . ceFEPime (MAXIPIME) IV Stopped (08/13/18 2008)  . vancomycin  Stopped (08/13/18 1202)   . sodium chloride   Intravenous Once  . amiodarone  200 mg Oral Daily  . atorvastatin  40 mg Oral q1800  . calcitRIOL  0.5 mcg Oral Q M,W,F  . Chlorhexidine Gluconate Cloth  6 each Topical Q0600  . citalopram  20 mg Oral Daily  . darbepoetin (ARANESP) injection - DIALYSIS  40 mcg Intravenous Q Fri-HD  . docusate sodium  100 mg Oral BID  . dronabinol  2.5 mg Oral BID AC  . erythromycin  250 mg Oral TID  . feeding supplement (ENSURE ENLIVE)  237 mL Oral BID BM  . insulin aspart  0-5 Units Subcutaneous QHS  . insulin aspart  0-9 Units Subcutaneous TID WC  . insulin aspart  3 Units Subcutaneous TID WC  . insulin glargine  8 Units Subcutaneous Daily  . isosorbide-hydrALAZINE  1 tablet Oral 3 times per day on Sun Tue Thu Sat  . metoCLOPramide  10 mg Oral TID  . multivitamin  1 tablet Oral Daily  . pantoprazole  40 mg Oral BID  . polyethylene glycol  17 g Oral Daily  . sevelamer carbonate  1,600 mg Oral 2 times per day  . sevelamer carbonate  4,000 mg Oral TID WC  . sodium chloride flush  3 mL Intravenous Q12H    Dialysis:  MWF [facility] 4h 27mn 400/800  80kg   2K/3Ca bath   Hep none  TDC/ new LUA AVG (12/5, maturing) - Calcitriol 178m PO q HD - No ESA recently, Hgb > 11  Assessment/Plan: 1. Dialysis access:S/p LUE AVG 12/5 by Dr. CaDonzetta MattersDeveloped large hematoma;  VVS following, not sure if AVG is patent or not. May need exploration when INR down.   2. ESRD:Continue HD per usual MWF schedule. No signs of overload - will plan to raise EDW up gradually, to 80.5kg for now. 3. Hypertension/volume:BP controlled, no edema on exam  4. Anemia:Hgb down to 7 (surg site hematoma), last given Aranesp 12/6. 1U PRBCs given today. 5. Metabolic bone disease:Ca ok, Phos pending. Continue home binders. 6. T2DM: Per primary 7. CAD/severe ICM with Heartmate 3 LVAD: Per HF team.   RoKelly SplinterD [facility]Co[facility] pager [phone] 08/14/2018, 3:33  PM

## 2018-08-14 NOTE — Progress Notes (Signed)
[place] for coumadin Indication: LVAD/Factor 5   Allergies  Allergen Reactions  . Metformin And Related Diarrhea    Patient Measurements: Height: 5' 7"  (170.2 cm) Weight: 176 lb 2.4 oz (79.9 kg) IBW/kg (Calculated) : 66.1 Heparin Dosing Weight: 80.5 kg  Vital Signs: Temp: 99.1 F (37.3 C) (12/10 0742) Temp Source: Oral (12/10 0742) BP: 100/79 (12/10 0742) Pulse Rate: 84 (12/10 0742)  Labs: Recent Labs    08/11/18 1144 08/11/18 2038  08/12/18 0311 08/12/18 0645 08/13/18 0300 08/14/18 0245  HGB  --   --    < > 8.1*  --  7.0* 9.2*  HCT  --   --   --  26.8*  --  22.1* 29.0*  PLT  --   --   --  184  --  189 241  LABPROT  --   --   --  22.1*  --  32.8* 38.4*  INR  --   --   --  1.96  --  3.27 4.00  HEPARINUNFRC 0.88* 0.73*  --   --  0.72*  --   --   CREATININE  --   --   --  7.66*  --  9.67* 5.85*   < > = values in this interval not displayed.    Estimated Creatinine Clearance: 14.8 mL/min (A) (by C-G formula based on SCr of 5.85 mg/dL (H)).   Medical History: Past Medical History:  Diagnosis Date  . Angina   . ASCVD (arteriosclerotic cardiovascular disease)    , Anterior infarction 2005, LAD diagonal bifurcation intervention 03/2004  . Automatic implantable cardiac defibrillator -St. Jude's       . Benign neoplasm of colon   . CHF (congestive heart failure) ([place])   . Chronic systolic heart failure ([place])   . Coronary artery disease     Widely patent previously placed stents in the left anterior   . Crohn's disease ([place])   . 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 Dialysis patient ([place])   . Dyspnea   . Gastroparesis   . GERD (gastroesophageal reflux disease)   . High cholesterol   . Hyperlipidemia   . Hypersomnolent    Previous diagnosis of narcolepsy  . Hypertension, essential   . Ischemic cardiomyopathy    Ejection fraction 15-20% catheterization 2010  . Type II or unspecified type diabetes  mellitus without mention of complication, not stated as uncontrolled   . Unspecified gastritis and gastroduodenitis without mention of hemorrhage     Assessment: 29 yom with hx of Heartmate 3 LVAD and ESRD, plan for LUE AVF placement on 12/5. On warfarin PTA - last dose taken on 12/1. INR 1.39 on admission. Underwent creation of AVF on 12/5 followed by cardiac cath. Pt started on IV heparin bridge while INR subtherapeutic.  INR up to 4 this am, hgb up to 9 after transfusion yesterday. Will continue to hold warfarin, started on nutrition supplements in hopes of dropping INR slowly. LUE hematoma stable, seen by VVS who would like to evacuate once INR trends down. Will continue to follow closely.   Goal of Therapy:  INR 2-3 Monitor platelets by anticoagulation protocol: Yes   Plan:  -Hold warfarin tonight  Erin Hearing PharmD., BCPS Clinical Pharmacist 08/14/2018 10:02 AM

## 2018-08-14 NOTE — Progress Notes (Addendum)
Patient ID: [name], male   DOB: 01-12-1966, 52 y.o.   MRN: [id number]   Advanced Heart Failure VAD Team Note  PCP-Cardiologist: No primary care provider on file.   Subjective:    08/09/18 underwent US-guided cannulation of LUE AV fistula and found to be sclerotic and not suitable for dialysis. LUE graft created from brachial artery to axillary vein. Coumadin resumed. INR up to 3.27.   WBCs 15.  CXR with left lingular atelectasis versus infiltrate.    12/7, he developed significant swelling in his left arm and ecchymosis left inner upper arm. Very painful. Seen by VVS, suspect hematoma.  Recommended elevation and wrapping, but wrapping was too painful for him.   Today he is complaining of ongoing LUE pain. Denies SOB.  Denies nausea.   LVAD Interrogation HM 3: Speed: 5500 Flow: 4.2 PI: 3.3.5 Power: 4.   [place] 08/09/18  RA mean 5 RV 34/6 PA 33/9, mean 18 PCWP mean 7 Oxygen saturations: PA 70% AO 100% Cardiac Output (Fick) 6.28  Cardiac Index (Fick) 3.26 PVR 1.75 WU Cardiac Output (Thermo) 5.56 Cardiac Index (Thermo) 2.89 PVR 1.98 WU  Objective:    Vital Signs:   Temp:  [97.8 F (36.6 C)-99.1 F (37.3 C)] 99.1 F (37.3 C) (12/10 0742) Pulse Rate:  [84-99] 84 (12/10 0742) Resp:  [11-20] 11 (12/10 0742) BP: (87-115)/(51-87) 100/79 (12/10 0742) SpO2:  [94 %-98 %] 94 % (12/10 0742) Weight:  [79.9 kg-80.1 kg] 79.9 kg (12/10 0500) Last BM Date: 08/08/18 Mean arterial Pressure 80s   Intake/Output:   Intake/Output Summary (Last 24 hours) at 08/14/2018 0833 Last data filed at 08/14/2018 0600 Gross per 24 hour  Intake 1724.79 ml  Output 2432 ml  Net -707.21 ml     Physical Exam    Physical Exam: GENERAL: In bed. NAD. HEENT: normal  NECK: HD cath on left. Supple, JVP 5-6   .  2+ bilaterally, no bruits.  No lymphadenopathy or thyromegaly appreciated.   CARDIAC:  Mechanical heart sounds with LVAD hum present.  LUNGS:  Clear to auscultation bilaterally.    ABDOMEN:  Soft, round, nontender, positive bowel sounds x4.     LVAD exit site: well-healed and incorporated.  Dressing dry and intact.  No erythema or drainage.  Stabilization device present and accurately applied.  Driveline dressing is being changed daily per sterile technique. EXTREMITIES:  Warm and dry, no cyanosis, clubbing, rash. LUE induration unable to doppler graft site.  NEUROLOGIC:  Alert and oriented x 4.  No dysarthria.  Affect pleasant.   Sensation intact to LUE.    Telemetry   NSR 90s personally reviewed.   EKG    No new tracings.    Labs   Basic Metabolic Panel: Recent Labs  Lab 08/08/18 1248 08/09/18 0245 08/10/18 0148 08/11/18 0231 08/12/18 0311 08/13/18 0300 08/14/18 0245  NA 134* 134* 134* 135 132* 132* 136  K 3.2* 3.4* 3.9 4.0 4.2 4.6 4.2  CL 97* 97* 97* 99 96* 95* 95*  CO2 23 26 24 25 24  20* 26  GLUCOSE 140* 189* 191* 100* 148* 127* 99  BUN 13 22* 32* 14 21* 32* 19  CREATININE 3.00* 4.46* 7.74* 5.07* 7.66* 9.67* 5.85*  CALCIUM 9.1 8.6* 8.0* 8.4* 8.3* 8.1* 8.4*  MG 1.6* 2.8*  --   --   --   --   --     Liver Function Tests: Recent Labs  Lab 08/08/18 1248  AST 18  ALT 19  ALKPHOS 87  BILITOT 0.6  PROT 6.8  ALBUMIN 3.2*   No results for input(s): LIPASE, AMYLASE in the last 168 hours. No results for input(s): AMMONIA in the last 168 hours.  CBC: Recent Labs  Lab 08/08/18 1248  08/10/18 0148 08/11/18 0231 08/12/18 0311 08/13/18 0300 08/14/18 0245  WBC 8.2   < > 8.3 9.7 14.5* 15.5* 15.3*  NEUTROABS 6.4  --   --   --   --   --   --   HGB 10.5*   < > 9.1* 8.8* 8.1* 7.0* 9.2*  HCT 32.7*   < > 28.6* 28.0* 26.8* 22.1* 29.0*  MCV 86.7   < > 88.0 90.0 90.2 89.5 90.1  PLT PLATELET CLUMPS NOTED ON SMEAR, UNABLE TO ESTIMATE   < > 206 175 184 189 241   < > = values in this interval not displayed.    INR: Recent Labs  Lab 08/10/18 0148 08/11/18 0231 08/12/18 0311 08/13/18 0300 08/14/18 0245  INR 1.33 1.41 1.96 3.27 4.00    Other  results:  EKG:    Imaging   No results found.   Medications:     Scheduled Medications: . sodium chloride   Intravenous Once  . amiodarone  200 mg Oral Daily  . atorvastatin  40 mg Oral q1800  . calcitRIOL  0.5 mcg Oral Q M,W,F  . Chlorhexidine Gluconate Cloth  6 each Topical Q0600  . citalopram  20 mg Oral Daily  . darbepoetin (ARANESP) injection - DIALYSIS  40 mcg Intravenous Q Fri-HD  . dronabinol  2.5 mg Oral BID AC  . erythromycin  250 mg Oral TID  . insulin aspart  0-5 Units Subcutaneous QHS  . insulin aspart  0-9 Units Subcutaneous TID WC  . insulin aspart  3 Units Subcutaneous TID WC  . insulin glargine  8 Units Subcutaneous Daily  . isosorbide-hydrALAZINE  1 tablet Oral 3 times per day on Sun Tue Thu Sat  . metoCLOPramide  10 mg Oral TID  . multivitamin  1 tablet Oral Daily  . pantoprazole  40 mg Oral BID  . sevelamer carbonate  1,600 mg Oral 2 times per day  . sevelamer carbonate  4,000 mg Oral TID WC  . sodium chloride flush  3 mL Intravenous Q12H  . Warfarin - Pharmacist Dosing Inpatient   Does not apply q1800    Infusions: . sodium chloride    . sodium chloride 10 mL/hr at 08/14/18 0600  . ceFEPime (MAXIPIME) IV Stopped (08/13/18 2008)  . vancomycin Stopped (08/13/18 1202)    PRN Medications: sodium chloride, acetaminophen, docusate sodium, fentaNYL (SUBLIMAZE) injection, hydrALAZINE, lidocaine-prilocaine, LORazepam, ondansetron (ZOFRAN) IV, promethazine, sodium chloride flush, traMADol   Patient Profile   Mr Cosman is a3 y.o.with history of CAD s/p anterior MI in 2010 and NSTEMI in 3/18 with DES to pRCA and mLAD, diabetic, gastroparesis, Chrons disease, DMIIand ischemic cardiomyopathy, 10/2018s/p Heartmate 3 LVAD (emergent exchange 02/2018 due to outflow graft kink) and ESRD (HD MWF). He has LUE AVR 05/2018.  He is being admitted for peri-op management of 2nd stage of AVF.  Assessment/Plan:    1. Chronic systolic CHF: Ischemic cardiomyopathy.  St Jude ICD. NSTEMI in March 2018 with DES to LAD and RCA, complicated by cardiogenic shock, low output requiring milrinone. Echo 8/18 with EF 20-25%, moderate MR. CPX (8/18) with severe functional impairment due to HF. He is s/p HM3 LVAD placement. He had pump thrombosis 6/19 due to [facility] outflow graft kinking. He had pump exchange for  another HM3. RHC showed low filling pressures and good cardiac output this admission.  - LDH stable.  - Discussed increasing HD dry weight with Dr. Posey Pronto.   - Continue Bidil 1 tab TID on non-HD days.  - Hold ASA for now with hematoma, restart prior to discharge.   - INR 4. Holding warfarin. Will need to start heparin once INR <1.8.   - He is now listed for heart/kidney at [facility]. [place] 08/09/18 with optimized filling pressures and cardiac output. Normal PA pressure.  2. ESRD: Developed peri-operatively after HM3 placement. He is tolerating HD, currently via catheter but had LUE graft placed this admission.  Hematoma post-AV LUE graft placement.  VVS saw, suspects hematoma.There question about graft patency.  - VVS recommends possible hematoma evacuation but would need to reverse coumadin. INR 4. He has not had coumadin since 12/8.  Continue to hold coumadin and start heparin when INR < 1.8.  - MWF HD - As above, discussed increasing dry weight with Dr. Posey Pronto based on [place] and low flows at dialysis last week => allowing greater dry weight, he had no PI events/low flows with HD here.   3. CAD: NSTEMI in 3/18. LHC with 99% ulcerated lesion proximal RCA with left to right collaterals, 95% mid LAD stenosis after mid LAD stent. s/p PCI to RCA and LAD on 11/25/16. - No chest pain.   - Continue statin. 4. h/o DVTs: Factor V Leiden heterozygote.  5. Diabetic gastroparesis: Difficult to manage. He has seen gastroparesis specialist at [facility]. Surprisingly, gastric emptying study was normal. However, electrogastrogram showed poor gastric accomodation/capacity. Therefore,  small frequent meals recommended. Also morning nausea thought to be GERD and Protonix was increased. Nausea seems to drive markedly high BP (resolves with nausea control).  No issues today. Continue current regimen.   - Continue reglan 5 mg tid/ac, chronic erythromycin, and Marinol.  - Continue Protonix BID.  - Make effort to eat small, more frequent meals.  6. DM: HgbA1c well-controlled recently. No change.   7. HTN:  Maps stable. Continue hydralazine today (non-HD day).  Hypertension seems to be driven by nausea (see above), so will try to minimize nausea.  8. PAD: Moderate bilateral disease on ABIs in 8/18. Denies claudication currently, no pedal ulcers. No change.   9. Atrial flutter: Paroxysmal. Remains in NSR.    10. Fever: Afebrile. WBC 15.3 maybe related to hematoma. CXR with possible left lingular infiltrate versus atelectasis.  Driveline site ok. Have been treating for PNA with cefepime/vancomycin.  - Continue cefepime/vancomycin for HCAP, will transition to cefdinir at discharge.  11. Anemia: Received 1PURBc 12/9. Hgb up to 9.2.   Major question today is regarding LUE hematoma. INR 4. Dr Donzetta Matters recommending evacuation but elevated INR prohibitive. ? Reversal . Holding coumadin. Last dose of coumadin was 12/8.   I reviewed the LVAD parameters from today, and compared the results to the patient's prior recorded data.  No programming changes were made.  The LVAD is functioning within specified parameters.  The patient performs LVAD self-test daily.  LVAD interrogation was negative for any significant power changes, alarms or PI events/speed drops.  LVAD equipment check completed and is in good working order.  Back-up equipment present.   LVAD education done on emergency procedures and precautions and reviewed exit site care.   Length of Stay: [place], NP 08/14/2018, 8:33 AM  VAD Team --- VAD ISSUES ONLY--- Pager [phone] (7am - 7am)  Advanced Heart Failure Team  Pager  [phone]  (M-F; 7a - 4p)  Please contact [facility] for night-coverage after hours (4p -7a ) and weekends on [email or web address]  Patient seen with NP, agree with the above note.   Left arm remains swollen and painful but neurovascularly intact.  Discussed with Dr. Donzetta Matters, suspect graft is not going to be functional.  There is hematoma at the surgical site.  For now, will allow INR to drift down and see if arm improves on its own without re-operation.  If pain/swelling do not resolve, Dr. Donzetta Matters will take back to OR for hematoma evacuation when INR is lower.  He is going to have to be dialyzed through his catheter.   Hemoglobin appropriately improved with transfusion.  No evidence for GI bleeding.   He remains afebrile.  On vancomycin/cefepime for HCAP.  WBCs high still but may be related to hematoma.    Torrin Crihfield 08/14/2018 9:20 AM

## 2018-08-14 NOTE — Progress Notes (Addendum)
  Progress Note    08/14/2018 8:30 AM 5 Days Post-Op  Subjective: Continues to have pain in left upper extremity with swelling  Vitals:   08/14/18 0300 08/14/18 0742  BP:  100/79  Pulse:  84  Resp: 14 11  Temp: 98.3 F (36.8 C) 99.1 F (37.3 C)  SpO2: 98% 94%    Physical Exam: Awake alert oriented Nonlabored respirations Left arm with edema left upper extremity, incisions are clean dry intact I cannot feel a thrill or pulsatility and graft  CBC    Component Value Date/Time   WBC 15.3 (H) 08/14/2018 0245   RBC 3.22 (L) 08/14/2018 0245   HGB 9.2 (L) 08/14/2018 0245   HGB 11.7 (L) 09/07/2016 1457   HCT 29.0 (L) 08/14/2018 0245   HCT 37.5 09/07/2016 1457   PLT 241 08/14/2018 0245   PLT 279 09/07/2016 1457   MCV 90.1 08/14/2018 0245   MCV 81 09/07/2016 1457   MCH 28.6 08/14/2018 0245   MCHC 31.7 08/14/2018 0245   RDW 14.9 08/14/2018 0245   RDW 14.9 09/07/2016 1457   LYMPHSABS 1.0 08/08/2018 1248   LYMPHSABS 1.3 09/07/2016 1457   MONOABS 0.6 08/08/2018 1248   EOSABS 0.2 08/08/2018 1248   EOSABS 0.2 09/07/2016 1457   BASOSABS 0.0 08/08/2018 1248   BASOSABS 0.0 09/07/2016 1457    BMET    Component Value Date/Time   NA 136 08/14/2018 0245   NA 144 05/11/2017 0938   K 4.2 08/14/2018 0245   CL 95 (L) 08/14/2018 0245   CO2 26 08/14/2018 0245   GLUCOSE 99 08/14/2018 0245   BUN 19 08/14/2018 0245   BUN 68 (H) 05/11/2017 0938   CREATININE 5.85 (H) 08/14/2018 0245   CREATININE 1.63 (H) 11/09/2016 1505   CALCIUM 8.4 (L) 08/14/2018 0245   CALCIUM 8.7 06/27/2017 1152   GFRNONAA 10 (L) 08/14/2018 0245   GFRNONAA 48 (L) 11/09/2016 1505   GFRAA 12 (L) 08/14/2018 0245   GFRAA 55 (L) 11/09/2016 1505    INR    Component Value Date/Time   INR 4.00 08/14/2018 0245     Intake/Output Summary (Last 24 hours) at 08/14/2018 0830 Last data filed at 08/14/2018 0600 Gross per 24 hour  Intake 1724.79 ml  Output 2432 ml  Net -707.21 ml     Assessment/plan:  52  y.o. male is status post placement of left upper extremity AV graft with significant hematoma in left upper extremity unfortunate this time his INR is prohibitive to hematoma evacuation.  If we can reverse his INR place him on heparin we could consider hematoma evacuation but I am somewhat concerned of recurrence.  I also do not know of the graft at this time is working potentially could undergo graft thrombectomy at that time or at least open evaluation of the graft.  This is a very tough situation I will discuss with primary team today.    Madeliene Tejera C. Donzetta Matters, MD [facility] Office: [phone] Pager: [phone]  08/14/2018 8:30 AM  Addendum: I discussed with Dr. Aundra Dubin and plan will be to allow INR to drift down slowly.  If upper extremity pain does not resolve he will likely need hematoma evacuation.  I am unsure if any graft is going to be able to work given the complicated nature of this case.  Servando Snare

## 2018-08-15 LAB — BASIC METABOLIC PANEL
ANION GAP: 13 (ref 5–15)
BUN: 36 mg/dL — AB (ref 6–20)
CO2: 25 mmol/L (ref 22–32)
Calcium: 8.1 mg/dL — ABNORMAL LOW (ref 8.9–10.3)
Chloride: 93 mmol/L — ABNORMAL LOW (ref 98–111)
Creatinine, Ser: 8.56 mg/dL — ABNORMAL HIGH (ref 0.61–1.24)
GFR calc Af Amer: 7 mL/min — ABNORMAL LOW (ref >60)
GFR calc non Af Amer: 6 mL/min — ABNORMAL LOW (ref >60)
Glucose, Bld: 217 mg/dL — ABNORMAL HIGH (ref 70–99)
Potassium: 4 mmol/L (ref 3.5–5.1)
Sodium: 131 mmol/L — ABNORMAL LOW (ref 135–145)

## 2018-08-15 LAB — CBC
HCT: 27.1 % — ABNORMAL LOW (ref 39.0–52.0)
Hemoglobin: 8.2 g/dL — ABNORMAL LOW (ref 13.0–17.0)
MCH: 27.3 pg (ref 26.0–34.0)
MCHC: 30.3 g/dL (ref 30.0–36.0)
MCV: 90.3 fL (ref 80.0–100.0)
Platelets: 248 10*3/uL (ref 150–400)
RBC: 3 MIL/uL — ABNORMAL LOW (ref 4.22–5.81)
RDW: 14.9 % (ref 11.5–15.5)
WBC: 14.5 10*3/uL — ABNORMAL HIGH (ref 4.0–10.5)
nRBC: 0.1 % (ref 0.0–0.2)

## 2018-08-15 LAB — CULTURE, BLOOD (ROUTINE X 2)
Culture: NO GROWTH
Culture: NO GROWTH
Special Requests: ADEQUATE
Special Requests: ADEQUATE

## 2018-08-15 LAB — PROTIME-INR
INR: 3.45
Prothrombin Time: 34.2 seconds — ABNORMAL HIGH (ref 11.4–15.2)

## 2018-08-15 LAB — GLUCOSE, CAPILLARY
GLUCOSE-CAPILLARY: 246 mg/dL — AB (ref 70–99)
Glucose-Capillary: 204 mg/dL — ABNORMAL HIGH (ref 70–99)
Glucose-Capillary: 271 mg/dL — ABNORMAL HIGH (ref 70–99)
Glucose-Capillary: 143 mg/dL — ABNORMAL HIGH (ref 70–99)

## 2018-08-15 LAB — LACTATE DEHYDROGENASE: LDH: 165 U/L (ref 98–192)

## 2018-08-15 MED ORDER — VANCOMYCIN HCL IN DEXTROSE 1-5 GM/200ML-% IV SOLN
INTRAVENOUS | Status: AC
  Administered 2018-08-15: 1000 mg via INTRAVENOUS
  Filled 2018-08-15: qty 200

## 2018-08-15 MED ORDER — HEPARIN SODIUM (PORCINE) 1000 UNIT/ML IJ SOLN
INTRAMUSCULAR | Status: AC
  Administered 2018-08-15: 1000 [IU] via INTRAVENOUS
  Filled 2018-08-15: qty 4

## 2018-08-15 MED ORDER — DARBEPOETIN ALFA 100 MCG/0.5ML IJ SOSY
100.0000 ug | PREFILLED_SYRINGE | INTRAMUSCULAR | Status: DC
  Filled 2018-08-15: qty 0.5

## 2018-08-15 MED ORDER — HEPARIN SODIUM (PORCINE) 1000 UNIT/ML IJ SOLN
1000.0000 [IU] | INTRAMUSCULAR | Status: DC | PRN
  Administered 2018-08-15: 1000 [IU] via INTRAVENOUS
  Filled 2018-08-15: qty 1

## 2018-08-15 MED ORDER — FENTANYL CITRATE (PF) 100 MCG/2ML IJ SOLN
INTRAMUSCULAR | Status: AC
  Filled 2018-08-15: qty 2

## 2018-08-15 MED FILL — Heparin Sod (Porcine)-NaCl IV Soln 1000 Unit/500ML-0.9%: INTRAVENOUS | Qty: 1000 | Status: AC

## 2018-08-15 NOTE — Progress Notes (Signed)
Pharmacy Antibiotic Note  [name] is a 52 y.o. male admitted on 08/08/2018 with pneumonia.  Pharmacy has been consulted for cefepime dosing with new fever, possible infiltrate on CXR.  Pain/swelling at site of upper arm AV graft, large hematoma found.  Pharmacy asked to to continue vancomycin/cefepime.  Driveline site looks okay, recent MRSA screen negative.  Pt is ESRD, on HD MWF.    Afebrile, wbc stable at 14, received abx post HD this morning.   Plan: Continue cefepime 2g qHD. Vancomycin 1g q HD. Consider trough prior to HD friday F/u cultures, fever, clinical course.  Height: 5' 7"  (170.2 cm) Weight: 181 lb 7 oz (82.3 kg) IBW/kg (Calculated) : 66.1  Temp (24hrs), Avg:98.5 F (36.9 C), Min:98 F (36.7 C), Max:98.8 F (37.1 C)  Recent Labs  Lab 08/11/18 0231 08/12/18 0311 08/13/18 0300 08/14/18 0245 08/15/18 0311  WBC 9.7 14.5* 15.5* 15.3* 14.5*  CREATININE 5.07* 7.66* 9.67* 5.85* 8.56*    Estimated Creatinine Clearance: 10.2 mL/min (A) (by C-G formula based on SCr of 8.56 mg/dL (H)).    Allergies  Allergen Reactions  . Metformin And Related Diarrhea    Antimicrobials this admission: Cefepime 12/7 >  Vancomycin 12/8>  Dose adjustments this admission:  Microbiology results: 12/6 BCx x 2: ngtd 12/6 MRSA PCR: neg  Thank you for allowing pharmacy to be a part of this patient's care.  Erin Hearing PharmD., BCPS Clinical Pharmacist 08/15/2018 10:41 AM

## 2018-08-15 NOTE — Progress Notes (Signed)
LVAD Coordinator Rounding Note:  Pt admitted 08/08/18 for preoperative anticoagulation management for 2nd state of LUE AV fistula (scheduled 08/09/18.)   HM III LVAD implanted on 06/12/17 by Dr. Prescott Gum under Destination Therapy criteria due to renal insufficiency. Pump exchange on 03/01/18 for outflow graft twist/obstruction.   Pt in bed this afternoon. Still c/o left arm pain. Surgery saw the pt this afternoon and may need to evacuate hematoma but will need to wait until INR is lower.  Vital signs: Temp: 98.6 HR:  88 Doppler Pressure: 104 Auto BP: 142/105 (116) O2 Sat: 95% on RA Wt: 80.5>80.3>82.3>80.1>79.9>80.1kg  LVAD interrogation reveals:  Speed: 5500 Flow: 4.5 Power: 4.4 w PI: 3.0  Alarms: none Events: none Hematocrit: 27 Fixed speed: 5500 Low speed limit: 5200  Drive Line:  CDI. Weekly dressing kits. Next dressing is due 08/17/18.  Labs: LDH trend: 238>196>175>166>171>165  INR trend: 1.39>1.33>3.27>1.96>3.45  Blood Products:  08/13/18>> 1 u/PRBC   Anticoagulation Plan: - INR Goal: 2.0 - 3.0 - ASA Dose: 81 mg daily   Device: -St Jude dual  -Therapies: on 200 bpm - Monitor: on VT 150 bpm  Adverse Events on VAD: - ESRD post LVAD; started CVVH 06/15/17; HD started 06/20/17 - Hospitalization 11/5 - 07/16/17 > gastroparesis diagnosis after EGD - Hospitalization 11/26 - 08/04/17 > gastroparesis - Hospitalization 1/8 - 09/16/17>gastroparesis - referred to Dr. Corliss Parish at [facility] - 10/31/17>rightupper arm arteriovenous graft with Artegraft; ligation of right first stage brachial vein transposition  - 12/11/13 elective admission for thrombectomy for AV graft in RUA  - 12/20/17 > intractable N/V - 01/19/18 > intractable N/V sent by EMS from dialysis center - 03/01/18 > outflow graft twist/occulsion requiring pump exchange - 05/23/18 > admitted for non-working fistula in RUA - 06/10/18 > admitted for intractable N/V   Plan/Recommendations:  1. Please page VAD coordinator  with any equipment questions or concerns. 2. VAD Coordinator will accompany pt to the OR for hematoma evacuation.  [name] RN [place] Coordinator  Office: [phone]  24/7 Pager: [phone]

## 2018-08-15 NOTE — Progress Notes (Addendum)
Patient ID: [name], male   DOB: 1966/02/06, 52 y.o.   MRN: [id number]   Advanced Heart Failure VAD Team Note  PCP-Cardiologist: No primary care provider on file.   Subjective:    08/09/18 underwent US-guided cannulation of LUE AV fistula and found to be sclerotic and not suitable for dialysis. LUE graft created from brachial artery to axillary vein. Coumadin resumed. INR up to 3.27.   12/7, he developed significant swelling in his left arm and ecchymosis left inner upper arm. Very painful. Seen by VVS, suspect hematoma.  Recommended elevation and wrapping, but wrapping was too painful for him. He has been off coumadin since 12/8. INR down to 3.4   LUE pain a little better today. He did not need pain medication last night.   Denies SOB/nausea.   LVAD Interrogation HM 3: Speed: 5500 Flow: 4.2 PI: 3.2 Power: 4.   [place] 08/09/18  RA mean 5 RV 34/6 PA 33/9, mean 18 PCWP mean 7 Oxygen saturations: PA 70% AO 100% Cardiac Output (Fick) 6.28  Cardiac Index (Fick) 3.26 PVR 1.75 WU Cardiac Output (Thermo) 5.56 Cardiac Index (Thermo) 2.89 PVR 1.98 WU  Objective:    Vital Signs:   Temp:  [98 F (36.7 C)-98.8 F (37.1 C)] 98 F (36.7 C) (12/11 0700) Pulse Rate:  [80-95] 84 (12/11 0845) Resp:  [14-22] 15 (12/11 0712) BP: (80-131)/(55-92) 98/83 (12/11 0845) SpO2:  [93 %-99 %] 99 % (12/11 0700) Weight:  [82.3 kg] 82.3 kg (12/11 0700) Last BM Date: 08/08/18 Mean arterial Pressure 80s   Intake/Output:   Intake/Output Summary (Last 24 hours) at 08/15/2018 0901 Last data filed at 08/14/2018 1513 Gross per 24 hour  Intake 539.82 ml  Output -  Net 539.82 ml     Physical Exam     Physical Exam: Seen in dialysis.  GENERAL: NAD HEENT: normal  NECK: Supple, JVP 7-8.  2+ bilaterally, no bruits.  No lymphadenopathy or thyromegaly appreciated.  RIJ HD cath.  CARDIAC:  Mechanical heart sounds with LVAD hum present.  LUNGS:  Clear to auscultation bilaterally.  ABDOMEN:   Soft, round, nontender, positive bowel sounds x4.     LVAD exit site: well-healed and incorporated.  Dressing dry and intact.  No erythema or drainage.  Stabilization device present and accurately applied.  Driveline dressing is being changed daily per sterile technique. EXTREMITIES:  Warm and dry, no cyanosis, clubbing, rash or edema . LUE edema medial aspect  NEUROLOGIC:  Alert and oriented x 4.   No aphasia.  No dysarthria.  Affect pleasant.      Telemetry   NSr 80-90s   EKG    No new tracings.    Labs   Basic Metabolic Panel: Recent Labs  Lab 08/08/18 1248 08/09/18 0245  08/11/18 0231 08/12/18 0311 08/13/18 0300 08/14/18 0245 08/15/18 0311  NA 134* 134*   < > 135 132* 132* 136 131*  K 3.2* 3.4*   < > 4.0 4.2 4.6 4.2 4.0  CL 97* 97*   < > 99 96* 95* 95* 93*  CO2 23 26   < > 25 24 20* 26 25  GLUCOSE 140* 189*   < > 100* 148* 127* 99 217*  BUN 13 22*   < > 14 21* 32* 19 36*  CREATININE 3.00* 4.46*   < > 5.07* 7.66* 9.67* 5.85* 8.56*  CALCIUM 9.1 8.6*   < > 8.4* 8.3* 8.1* 8.4* 8.1*  MG 1.6* 2.8*  --   --   --   --   --   --    < > =  values in this interval not displayed.    Liver Function Tests: Recent Labs  Lab 08/08/18 1248  AST 18  ALT 19  ALKPHOS 87  BILITOT 0.6  PROT 6.8  ALBUMIN 3.2*   No results for input(s): LIPASE, AMYLASE in the last 168 hours. No results for input(s): AMMONIA in the last 168 hours.  CBC: Recent Labs  Lab 08/08/18 1248  08/11/18 0231 08/12/18 0311 08/13/18 0300 08/14/18 0245 08/15/18 0311  WBC 8.2   < > 9.7 14.5* 15.5* 15.3* 14.5*  NEUTROABS 6.4  --   --   --   --   --   --   HGB 10.5*   < > 8.8* 8.1* 7.0* 9.2* 8.2*  HCT 32.7*   < > 28.0* 26.8* 22.1* 29.0* 27.1*  MCV 86.7   < > 90.0 90.2 89.5 90.1 90.3  PLT PLATELET CLUMPS NOTED ON SMEAR, UNABLE TO ESTIMATE   < > 175 184 189 241 248   < > = values in this interval not displayed.    INR: Recent Labs  Lab 08/11/18 0231 08/12/18 0311 08/13/18 0300 08/14/18 0245  08/15/18 0311  INR 1.41 1.96 3.27 4.00 3.45    Other results:  EKG:    Imaging   No results found.   Medications:     Scheduled Medications: . sodium chloride   Intravenous Once  . amiodarone  200 mg Oral Daily  . atorvastatin  40 mg Oral q1800  . calcitRIOL  0.5 mcg Oral Q M,W,F  . Chlorhexidine Gluconate Cloth  6 each Topical Q0600  . citalopram  20 mg Oral Daily  . darbepoetin (ARANESP) injection - DIALYSIS  40 mcg Intravenous Q Fri-HD  . docusate sodium  100 mg Oral BID  . dronabinol  2.5 mg Oral BID AC  . erythromycin  250 mg Oral TID  . feeding supplement (ENSURE ENLIVE)  237 mL Oral BID BM  . insulin aspart  0-5 Units Subcutaneous QHS  . insulin aspart  0-9 Units Subcutaneous TID WC  . insulin aspart  3 Units Subcutaneous TID WC  . insulin glargine  8 Units Subcutaneous Daily  . isosorbide-hydrALAZINE  1 tablet Oral 3 times per day on Sun Tue Thu Sat  . metoCLOPramide  10 mg Oral TID  . multivitamin  1 tablet Oral Daily  . pantoprazole  40 mg Oral BID  . polyethylene glycol  17 g Oral Daily  . sevelamer carbonate  1,600 mg Oral 2 times per day  . sevelamer carbonate  4,000 mg Oral TID WC  . sodium chloride flush  3 mL Intravenous Q12H    Infusions: . sodium chloride    . sodium chloride 10 mL/hr at 08/14/18 0600  . ceFEPime (MAXIPIME) IV Stopped (08/13/18 2008)  . vancomycin Stopped (08/13/18 1202)    PRN Medications: sodium chloride, acetaminophen, docusate sodium, fentaNYL (SUBLIMAZE) injection, hydrALAZINE, lidocaine-prilocaine, LORazepam, ondansetron (ZOFRAN) IV, promethazine, sodium chloride flush, traMADol   Patient Profile   Mr Zeimet is a15 y.o.with history of CAD s/p anterior MI in 2010 and NSTEMI in 3/18 with DES to pRCA and mLAD, diabetic, gastroparesis, Chrons disease, DMIIand ischemic cardiomyopathy, 10/2018s/p Heartmate 3 LVAD (emergent exchange 02/2018 due to outflow graft kink) and ESRD (HD MWF). He has LUE AVR 05/2018.  He is being  admitted for peri-op management of 2nd stage of AVF.  Assessment/Plan:    1. Chronic systolic CHF: Ischemic cardiomyopathy. [employer] ICD. NSTEMI in March 2018 with DES to LAD and RCA, complicated  by cardiogenic shock, low output requiring milrinone. Echo 8/18 with EF 20-25%, moderate MR. CPX (8/18) with severe functional impairment due to HF. He is s/p HM3 LVAD placement. He had pump thrombosis 6/19 due to [facility] outflow graft kinking. He had pump exchange for another HM3. RHC showed low filling pressures and good cardiac output this admission.  - LDH stable.  - Discussed increasing HD dry weight with Dr. Posey Pronto.   - Continue Bidil 1 tab TID on non-HD days.  - Hold ASA for now with hematoma, restart prior to discharge.   - INR 3.45.  Holding warfarin. Will need to start heparin once INR <1.8.   - He is now listed for heart/kidney at [facility]. [place] 08/09/18 with optimized filling pressures and cardiac output. Normal PA pressure.  2. ESRD: Developed peri-operatively after HM3 placement. He is tolerating HD, currently via catheter but had LUE graft placed this admission.  Hematoma post-AV LUE graft placement.  VVS saw, suspects hematoma.There question about graft patency.  - VVS recommends possible hematoma evacuation but will need wait until INR lower. Would not reverse coumadin with prior pump exchange. Anticipate evacuation/exploration when INR lower.  INR 3.45. He will continue nutritional supplements to see if this helps with elevated INR.  He has not had coumadin since 12/8.  Continue to hold coumadin and start heparin when INR < 1.8.  - MWF HD - As above, discussed increasing dry weight with Dr. Posey Pronto based on [place] and low flows at dialysis last week => allowing greater dry weight, he had no PI events/low flows with HD here.  3. CAD: NSTEMI in 3/18. LHC with 99% ulcerated lesion proximal RCA with left to right collaterals, 95% mid LAD stenosis after mid LAD stent. s/p PCI to RCA and LAD on  11/25/16. - No chest pain.   - Continue statin. 4. h/o DVTs: Factor V Leiden heterozygote.  5. Diabetic gastroparesis: Difficult to manage. He has seen gastroparesis specialist at [facility]. Surprisingly, gastric emptying study was normal. However, electrogastrogram showed poor gastric accomodation/capacity. Therefore, small frequent meals recommended. Also morning nausea thought to be GERD and Protonix was increased. Nausea seems to drive markedly high BP (resolves with nausea control).  No issues with nausea. Continue current regimen.   - Continue reglan 5 mg tid/ac, chronic erythromycin, and Marinol.  - Continue Protonix BID.   - Make effort to eat small, more frequent meals.  6. DM: HgbA1c well-controlled recently. No change.   7. HTN:  Stable. Continue hydralazine today (non-HD day).  Hypertension seems to be driven by nausea (see above), so will try to minimize nausea.  8. PAD: Moderate bilateral disease on ABIs in 8/18. Denies claudication currently, no pedal ulcers. No change.   9. Atrial flutter: Paroxysmal. Remains in NSR.    10. Fever: Afebrile. WBC down to 14.5. maybe related to hematoma. CXR with possible left lingular infiltrate versus atelectasis.  Driveline site ok. Have been treating for PNA with cefepime/vancomycin.  - Continue cefepime/vancomycin for HCAP, will transition to cefdinir at discharge.  11. Anemia: Received 1PURBc 12/9. Hgb trending back down to 8.2. LUE arm hematoma appears stable. .   I reviewed the LVAD parameters from today, and compared the results to the patient's prior recorded data.  No programming changes were made.  The LVAD is functioning within specified parameters.  The patient performs LVAD self-test daily.  LVAD interrogation was negative for any significant power changes, alarms or PI events/speed drops.  LVAD equipment check completed and is  in good working order.  Back-up equipment present.   LVAD education done on emergency procedures and  precautions and reviewed exit site care.   Length of Stay: [place], NP 08/15/2018, 9:01 AM  VAD Team --- VAD ISSUES ONLY--- Pager [phone] (7am - 7am)  Advanced Heart Failure Team  Pager [phone] (M-F; 7a - 4p)  Please contact [facility] for night-coverage after hours (4p -7a ) and weekends on [email or web address]  Patient seen with NP, agree with the above note.  Still with pain in the left arm as well as swelling but with some improvement.  Hgb down a bit to 8.2.  INR trending down but still 3.4.  Getting HD today.  MAP stable, LVAD parameters reviewed and stable.   Will continue supportive care for left arm for now.  INR trending down.  If it does not improve symptomatically, Dr. Donzetta Matters will take back to OR for exploration/hematoma evacuation.    Tom Macpherson 08/15/2018 10:04 AM

## 2018-08-15 NOTE — Progress Notes (Addendum)
[facility] Progress Note   Subjective:  Seen on HD, 2L UF goal - BP tolerating. No dyspnea. Still with L arm pain and edema, ?possibly less that yesterday.  Objective Vitals:   08/15/18 0830 08/15/18 0845 08/15/18 0906 08/15/18 0930  BP: (!) 87/75 98/83 103/83 99/78  Pulse: 80 84 82 91  Resp:      Temp:      TempSrc:      SpO2:      Weight:      Height:       Physical Exam General: Well appearing man, NAD Heart: Continuous LAD hum Lungs: CTA anteriorly Extremities: No LE edema Dialysis Access: TDC in use, new LUE AVG + arm edema/ecchymosis  Additional Objective Labs: Basic Metabolic Panel: Recent Labs  Lab 08/13/18 0300 08/14/18 0245 08/15/18 0311  NA 132* 136 131*  K 4.6 4.2 4.0  CL 95* 95* 93*  CO2 20* 26 25  GLUCOSE 127* 99 217*  BUN 32* 19 36*  CREATININE 9.67* 5.85* 8.56*  CALCIUM 8.1* 8.4* 8.1*   Liver Function Tests: Recent Labs  Lab 08/08/18 1248  AST 18  ALT 19  ALKPHOS 87  BILITOT 0.6  PROT 6.8  ALBUMIN 3.2*   CBC: Recent Labs  Lab 08/08/18 1248  08/11/18 0231 08/12/18 0311 08/13/18 0300 08/14/18 0245 08/15/18 0311  WBC 8.2   < > 9.7 14.5* 15.5* 15.3* 14.5*  NEUTROABS 6.4  --   --   --   --   --   --   HGB 10.5*   < > 8.8* 8.1* 7.0* 9.2* 8.2*  HCT 32.7*   < > 28.0* 26.8* 22.1* 29.0* 27.1*  MCV 86.7   < > 90.0 90.2 89.5 90.1 90.3  PLT PLATELET CLUMPS NOTED ON SMEAR, UNABLE TO ESTIMATE   < > 175 184 189 241 248   < > = values in this interval not displayed.   Blood Culture    Component Value Date/Time   SDES BLOOD SITE NOT SPECIFIED 08/10/2018 0906   SPECREQUEST  08/10/2018 0906    Blood Culture adequate volume BOTTLES DRAWN AEROBIC ONLY   CULT  08/10/2018 0906    NO GROWTH 4 DAYS Performed at [facility], [place] [address] [place], [place] [address]    REPTSTATUS PENDING 08/10/2018 0906   CBG: Recent Labs  Lab 08/13/18 1650 08/13/18 2112 08/14/18 0744 08/14/18 1652 08/14/18 2109  GLUCAP 122* 112*  132* 183* 171*   Medications: . sodium chloride    . sodium chloride 10 mL/hr at 08/14/18 0600  . ceFEPime (MAXIPIME) IV Stopped (08/13/18 2008)  . vancomycin Stopped (08/13/18 1202)   . sodium chloride   Intravenous Once  . amiodarone  200 mg Oral Daily  . atorvastatin  40 mg Oral q1800  . calcitRIOL  0.5 mcg Oral Q M,W,F  . Chlorhexidine Gluconate Cloth  6 each Topical Q0600  . citalopram  20 mg Oral Daily  . darbepoetin (ARANESP) injection - DIALYSIS  40 mcg Intravenous Q Fri-HD  . docusate sodium  100 mg Oral BID  . dronabinol  2.5 mg Oral BID AC  . erythromycin  250 mg Oral TID  . feeding supplement (ENSURE ENLIVE)  237 mL Oral BID BM  . insulin aspart  0-5 Units Subcutaneous QHS  . insulin aspart  0-9 Units Subcutaneous TID WC  . insulin aspart  3 Units Subcutaneous TID WC  . insulin glargine  8 Units Subcutaneous Daily  . isosorbide-hydrALAZINE  1  tablet Oral 3 times per day on Sun Tue Thu Sat  . metoCLOPramide  10 mg Oral TID  . multivitamin  1 tablet Oral Daily  . pantoprazole  40 mg Oral BID  . polyethylene glycol  17 g Oral Daily  . sevelamer carbonate  1,600 mg Oral 2 times per day  . sevelamer carbonate  4,000 mg Oral TID WC  . sodium chloride flush  3 mL Intravenous Q12H    Dialysis Orders: MWF [facility] 4h 96mn 400/800  80kg   2K/3Ca bath   Hep none  TDC/ new LUA AVG (12/5, maturing) - Calcitriol 153m PO q HD - No ESA recently, Hgb > 11  Assessment/Plan: 1. Dialysis access:S/p LUE AVG 12/5 by Dr. CaDonzetta Mattersnow with large hematoma. VVS following, not sure if AVG is patent or not. May need exploration when INR down.   2. ESRD:Continue HD per usual MWF schedule. No signs of overload - will plan to raise EDW up gradually, to 80.5kg for now. 3. Hypertension/volume:BP controlled, no edema on exam  4. Anemia:Hgb down to 7 (surg site hematoma), last given Aranesp 12/6 -- ^d dose for next. 1U PRBCs given 1242/35. Metabolic bone disease:Ca ok, Phos pending. Continue  home binders. 6. T2DM: Per primary 7. CAD/severe ICM with Heartmate 3 LVAD: Per HF team.  KaVeneta PentonPA-C 08/15/2018, 9:37 AM  [facility] Pager: [phone] seen, examined and agree w A/P as above.  RoKelly SplinterD CaNewell Rubbermaidager [phone] 08/15/2018, 12:26 PM

## 2018-08-15 NOTE — Progress Notes (Signed)
  Progress Note    08/15/2018 1:08 PM 6 Days Post-Op  Subjective: Still having left arm pain  Vitals:   08/15/18 1122 08/15/18 1153  BP: 91/66 (!) 142/105  Pulse: 82 88  Resp: 15 14  Temp: 97.7 F (36.5 C) 98.6 F (37 C)  SpO2: 99% 95%    Physical Exam: Awake alert oriented Left arm remains edematous Left hand sensorimotor intact  CBC    Component Value Date/Time   WBC 14.5 (H) 08/15/2018 0311   RBC 3.00 (L) 08/15/2018 0311   HGB 8.2 (L) 08/15/2018 0311   HGB 11.7 (L) 09/07/2016 1457   HCT 27.1 (L) 08/15/2018 0311   HCT 37.5 09/07/2016 1457   PLT 248 08/15/2018 0311   PLT 279 09/07/2016 1457   MCV 90.3 08/15/2018 0311   MCV 81 09/07/2016 1457   MCH 27.3 08/15/2018 0311   MCHC 30.3 08/15/2018 0311   RDW 14.9 08/15/2018 0311   RDW 14.9 09/07/2016 1457   LYMPHSABS 1.0 08/08/2018 1248   LYMPHSABS 1.3 09/07/2016 1457   MONOABS 0.6 08/08/2018 1248   EOSABS 0.2 08/08/2018 1248   EOSABS 0.2 09/07/2016 1457   BASOSABS 0.0 08/08/2018 1248   BASOSABS 0.0 09/07/2016 1457    BMET    Component Value Date/Time   NA 131 (L) 08/15/2018 0311   NA 144 05/11/2017 0938   K 4.0 08/15/2018 0311   CL 93 (L) 08/15/2018 0311   CO2 25 08/15/2018 0311   GLUCOSE 217 (H) 08/15/2018 0311   BUN 36 (H) 08/15/2018 0311   BUN 68 (H) 05/11/2017 0938   CREATININE 8.56 (H) 08/15/2018 0311   CREATININE 1.63 (H) 11/09/2016 1505   CALCIUM 8.1 (L) 08/15/2018 0311   CALCIUM 8.7 06/27/2017 1152   GFRNONAA 6 (L) 08/15/2018 0311   GFRNONAA 48 (L) 11/09/2016 1505   GFRAA 7 (L) 08/15/2018 0311   GFRAA 55 (L) 11/09/2016 1505    INR    Component Value Date/Time   INR 3.45 08/15/2018 0311     Intake/Output Summary (Last 24 hours) at 08/15/2018 1308 Last data filed at 08/15/2018 1153 Gross per 24 hour  Intake 736.84 ml  Output 2000 ml  Net -1263.16 ml     Assessment/plan:  52 y.o. male is s/p left arm AV graft with significant hematoma after supratherapeutic INR.  INR remains  greater than 3.  Gradually trending down.  When is in normal range will consider hematoma evacuation if patient remains symptomatic.  I discussed this with the patient he agrees    Erlene Quan C. Donzetta Matters, MD [facility] Office: [phone] Pager: [phone]  08/15/2018 1:08 PM

## 2018-08-16 LAB — BASIC METABOLIC PANEL
Anion gap: 14 (ref 5–15)
BUN: 23 mg/dL — ABNORMAL HIGH (ref 6–20)
CO2: 24 mmol/L (ref 22–32)
Calcium: 8.2 mg/dL — ABNORMAL LOW (ref 8.9–10.3)
Chloride: 94 mmol/L — ABNORMAL LOW (ref 98–111)
Creatinine, Ser: 4.87 mg/dL — ABNORMAL HIGH (ref 0.61–1.24)
GFR calc Af Amer: 15 mL/min — ABNORMAL LOW (ref >60)
GFR calc non Af Amer: 13 mL/min — ABNORMAL LOW (ref >60)
Glucose, Bld: 275 mg/dL — ABNORMAL HIGH (ref 70–99)
Potassium: 3.6 mmol/L (ref 3.5–5.1)
Sodium: 132 mmol/L — ABNORMAL LOW (ref 135–145)

## 2018-08-16 LAB — GLUCOSE, CAPILLARY
GLUCOSE-CAPILLARY: 189 mg/dL — AB (ref 70–99)
Glucose-Capillary: 210 mg/dL — ABNORMAL HIGH (ref 70–99)
Glucose-Capillary: 163 mg/dL — ABNORMAL HIGH (ref 70–99)
Glucose-Capillary: 147 mg/dL — ABNORMAL HIGH (ref 70–99)

## 2018-08-16 LAB — CBC
HCT: 25.7 % — ABNORMAL LOW (ref 39.0–52.0)
Hemoglobin: 8.1 g/dL — ABNORMAL LOW (ref 13.0–17.0)
MCH: 28.4 pg (ref 26.0–34.0)
MCHC: 31.5 g/dL (ref 30.0–36.0)
MCV: 90.2 fL (ref 80.0–100.0)
Platelets: 271 10*3/uL (ref 150–400)
RBC: 2.85 MIL/uL — ABNORMAL LOW (ref 4.22–5.81)
RDW: 14.7 % (ref 11.5–15.5)
WBC: 14.4 10*3/uL — AB (ref 4.0–10.5)
nRBC: 0 % (ref 0.0–0.2)

## 2018-08-16 LAB — LACTATE DEHYDROGENASE: LDH: 195 U/L — ABNORMAL HIGH (ref 98–192)

## 2018-08-16 LAB — PROTIME-INR
INR: 2.23
PROTHROMBIN TIME: 24.4 s — AB (ref 11.4–15.2)

## 2018-08-16 MED ORDER — WARFARIN SODIUM 3 MG PO TABS
3.0000 mg | ORAL_TABLET | Freq: Once | ORAL | Status: AC
  Administered 2018-08-16: 3 mg via ORAL
  Filled 2018-08-16: qty 1

## 2018-08-16 MED ORDER — CHLORHEXIDINE GLUCONATE CLOTH 2 % EX PADS
6.0000 | MEDICATED_PAD | Freq: Every day | CUTANEOUS | Status: DC
  Administered 2018-08-16: 6 via TOPICAL

## 2018-08-16 MED ORDER — WARFARIN - PHARMACIST DOSING INPATIENT
Freq: Every day | Status: DC
  Administered 2018-08-16 – 2018-08-17 (×2)

## 2018-08-16 NOTE — Progress Notes (Addendum)
Patient ID: [name], male   DOB: 08-Jul-1966, 52 y.o.   MRN: [id number]   Advanced Heart Failure VAD Team Note  PCP-Cardiologist: No primary care provider on file.   Subjective:    08/09/18 underwent US-guided cannulation of LUE AV fistula and found to be sclerotic and not suitable for dialysis. LUE graft created from brachial artery to axillary vein. Coumadin resumed.   12/7, he developed significant swelling in his left arm and ecchymosis left inner upper arm. Very painful. Seen by VVS, suspect hematoma.  Recommended elevation and wrapping, but wrapping was too painful for him. He has been off coumadin since 12/8.   INR down to 3.4>2.2   Denies nausea. Ongoing pain in LUE.   LVAD Interrogation HM 3: Speed: 5500 Flow: 4.3  PI: 3.3  Power: 4.   [place] 08/09/18  RA mean 5 RV 34/6 PA 33/9, mean 18 PCWP mean 7 Oxygen saturations: PA 70% AO 100% Cardiac Output (Fick) 6.28  Cardiac Index (Fick) 3.26 PVR 1.75 WU Cardiac Output (Thermo) 5.56 Cardiac Index (Thermo) 2.89 PVR 1.98 WU  Objective:    Vital Signs:   Temp:  [97.7 F (36.5 C)-99.8 F (37.7 C)] 98.7 F (37.1 C) (12/12 0733) Pulse Rate:  [80-94] 86 (12/12 0733) Resp:  [14-21] 19 (12/12 0733) BP: (87-142)/(66-105) 103/85 (12/12 0733) SpO2:  [92 %-100 %] 100 % (12/12 0733) Weight:  [80.1 kg-80.7 kg] 80.7 kg (12/12 0531) Last BM Date: 08/08/18 Mean arterial Pressure 80s    Intake/Output:   Intake/Output Summary (Last 24 hours) at 08/16/2018 0810 Last data filed at 08/16/2018 0600 Gross per 24 hour  Intake 1491.02 ml  Output 2000 ml  Net -508.98 ml     Physical Exam     Physical Exam: GENERAL: Sitting on the side of the bed. NAD.  HEENT: normal  NECK: Supple, JVP 6-7   .  2+ bilaterally, no bruits.  No lymphadenopathy or thyromegaly appreciated.   CARDIAC:  Mechanical heart sounds with LVAD hum present.  LUNGS:  Clear to auscultation bilaterally. Room air.  ABDOMEN:  Soft, round, nontender, positive  bowel sounds x4.     LVAD exit site: well-healed and incorporated.  Dressing dry and intact.  No erythema or drainage.  Stabilization device present and accurately applied.  Driveline dressing is being changed daily per sterile technique. EXTREMITIES:  Warm and dry, no cyanosis, clubbing, rash. LUE edema medial induration .  NEUROLOGIC:  Alert and oriented x 4.  No aphasia.  No dysarthria.  Affect pleasant.       Telemetry   NSr 80-90s   EKG    No new tracings.    Labs   Basic Metabolic Panel: Recent Labs  Lab 08/12/18 0311 08/13/18 0300 08/14/18 0245 08/15/18 0311 08/16/18 0208  NA 132* 132* 136 131* 132*  K 4.2 4.6 4.2 4.0 3.6  CL 96* 95* 95* 93* 94*  CO2 24 20* 26 25 24   GLUCOSE 148* 127* 99 217* 275*  BUN 21* 32* 19 36* 23*  CREATININE 7.66* 9.67* 5.85* 8.56* 4.87*  CALCIUM 8.3* 8.1* 8.4* 8.1* 8.2*    Liver Function Tests: No results for input(s): AST, ALT, ALKPHOS, BILITOT, PROT, ALBUMIN in the last 168 hours. No results for input(s): LIPASE, AMYLASE in the last 168 hours. No results for input(s): AMMONIA in the last 168 hours.  CBC: Recent Labs  Lab 08/12/18 0311 08/13/18 0300 08/14/18 0245 08/15/18 0311 08/16/18 0208  WBC 14.5* 15.5* 15.3* 14.5* 14.4*  HGB 8.1* 7.0*  9.2* 8.2* 8.1*  HCT 26.8* 22.1* 29.0* 27.1* 25.7*  MCV 90.2 89.5 90.1 90.3 90.2  PLT 184 189 241 248 271    INR: Recent Labs  Lab 08/12/18 0311 08/13/18 0300 08/14/18 0245 08/15/18 0311 08/16/18 0208  INR 1.96 3.27 4.00 3.45 2.23    Other results:  EKG:    Imaging   No results found.   Medications:     Scheduled Medications: . sodium chloride   Intravenous Once  . amiodarone  200 mg Oral Daily  . atorvastatin  40 mg Oral q1800  . calcitRIOL  0.5 mcg Oral Q M,W,F  . Chlorhexidine Gluconate Cloth  6 each Topical Q0600  . citalopram  20 mg Oral Daily  . [START ON 08/17/2018] darbepoetin (ARANESP) injection - DIALYSIS  100 mcg Intravenous Q Fri-HD  . docusate sodium   100 mg Oral BID  . dronabinol  2.5 mg Oral BID AC  . erythromycin  250 mg Oral TID  . feeding supplement (ENSURE ENLIVE)  237 mL Oral BID BM  . insulin aspart  0-5 Units Subcutaneous QHS  . insulin aspart  0-9 Units Subcutaneous TID WC  . insulin aspart  3 Units Subcutaneous TID WC  . insulin glargine  8 Units Subcutaneous Daily  . isosorbide-hydrALAZINE  1 tablet Oral 3 times per day on Sun Tue Thu Sat  . metoCLOPramide  10 mg Oral TID  . multivitamin  1 tablet Oral Daily  . pantoprazole  40 mg Oral BID  . polyethylene glycol  17 g Oral Daily  . sevelamer carbonate  1,600 mg Oral 2 times per day  . sevelamer carbonate  4,000 mg Oral TID WC  . sodium chloride flush  3 mL Intravenous Q12H    Infusions: . sodium chloride    . sodium chloride 10 mL/hr at 08/14/18 0600  . ceFEPime (MAXIPIME) IV 2 g (08/15/18 1333)  . vancomycin Stopped (08/15/18 1114)    PRN Medications: sodium chloride, acetaminophen, docusate sodium, fentaNYL (SUBLIMAZE) injection, heparin, hydrALAZINE, lidocaine-prilocaine, LORazepam, ondansetron (ZOFRAN) IV, promethazine, sodium chloride flush, traMADol   Patient Profile   [name] is a73 y.o.with history of CAD s/p anterior MI in 2010 and NSTEMI in 3/18 with DES to pRCA and mLAD, diabetic, gastroparesis, Chrons disease, DMIIand ischemic cardiomyopathy, 10/2018s/p Heartmate 3 LVAD (emergent exchange 02/2018 due to outflow graft kink) and ESRD (HD MWF). He has LUE AVR 05/2018.  He is being admitted for peri-op management of 2nd stage of AVF.  Assessment/Plan:    1. Chronic systolic CHF: Ischemic cardiomyopathy. St Jude ICD. NSTEMI in March 2018 with DES to LAD and RCA, complicated by cardiogenic shock, low output requiring milrinone. Echo 8/18 with EF 20-25%, moderate [name]. CPX (8/18) with severe functional impairment due to HF. He is s/p HM3 LVAD placement. He had pump thrombosis 6/19 due to [facility] outflow graft kinking. He had pump exchange for another  HM3. RHC showed low filling pressures and good cardiac output this admission.  - LDH stable.  - Discussed increasing HD dry weight with Dr. Posey Pronto.   - Continue Bidil 1 tab TID on non-HD days.  - Hold ASA for now with hematoma, restart prior to discharge.   - INR down to 2.23.  Holding warfarin. Will need to start heparin once INR <1.8.   - He is now listed for heart/kidney at [facility]. [place] 08/09/18 with optimized filling pressures and cardiac output. Normal PA pressure.  2. ESRD: Developed peri-operatively after HM3 placement. He is tolerating HD,  currently via catheter but had LUE graft placed this admission.  Hematoma post-AV LUE graft placement.  VVS saw, suspects hematoma.There question about graft patency.  - VVS recommends possible hematoma evacuation but will need wait until INR lower. Would not reverse coumadin with prior pump exchange. Anticipate evacuation/exploration when INR lower. Hopefully for tomorrow?   INR down to 2.23.  He will continue nutritional supplements to see if this helps with elevated INR.  He has not had coumadin since 12/8.  Continue to hold coumadin and start heparin when INR < 1.8.  - MWF HD - As above, discussed increasing dry weight with Dr. Posey Pronto based on [place] and low flows at dialysis last week => allowing greater dry weight, he had no PI events/low flows with HD here.   3. CAD: NSTEMI in 3/18. LHC with 99% ulcerated lesion proximal RCA with left to right collaterals, 95% mid LAD stenosis after mid LAD stent. s/p PCI to RCA and LAD on 11/25/16.  - No chest pain.   - Continue statin. 4. h/o DVTs: Factor V Leiden heterozygote.  5. Diabetic gastroparesis: Difficult to manage. He has seen gastroparesis specialist at [facility]. Surprisingly, gastric emptying study was normal. However, electrogastrogram showed poor gastric accomodation/capacity. Therefore, small frequent meals recommended. Also morning nausea thought to be GERD and Protonix was increased. Nausea  seems to drive markedly high BP (resolves with nausea control).  No issues. Continue current regimen.   - Continue reglan 5 mg tid/ac, chronic erythromycin, and Marinol.  - Continue Protonix BID.   - Make effort to eat small, more frequent meals.  6. DM: HgbA1c well-controlled recently. No change.   7. HTN:  Stable. Continue hydralazine today (non-HD day).  Hypertension seems to be driven by nausea (see above), so will try to minimize nausea.  8. PAD: Moderate bilateral disease on ABIs in 8/18. Denies claudication currently, no pedal ulcers. No change.   9. Atrial flutter: Paroxysmal. Remains in NSR.    10. Fever: Afebrile. WBC down to 14.4. maybe related to hematoma. Blood Cultures negative. CXR with possible left lingular infiltrate versus atelectasis.  Driveline site ok. Have been treating for PNA with cefepime. He has been on cefepime 5 days and vancomycin 4 days. Stop vancomycin.  Stop cefepime 08/17/18.   11. Anemia: Received 1PURBc 12/9. Hgb stable 8.2>8.1. LUE arm hematoma appears stable.   I reviewed the LVAD parameters from today, and compared the results to the patient's prior recorded data.  No programming changes were made.  The LVAD is functioning within specified parameters.  The patient performs LVAD self-test daily.  LVAD interrogation was negative for any significant power changes, alarms or PI events/speed drops.  LVAD equipment check completed and is in good working order.  Back-up equipment present.   LVAD education done on emergency procedures and precautions and reviewed exit site care.   Length of Stay: [place], NP 08/16/2018, 8:10 AM  VAD Team --- VAD ISSUES ONLY--- Pager [phone] (7am - 7am)  Advanced Heart Failure Team  Pager [phone] (M-F; 7a - 4p)  Please contact [facility] for night-coverage after hours (4p -7a ) and weekends on [email or web address]  Patient seen with NP, agree with the above note.    Still with pain in the left arm as well as swelling.  Arm  looks about the same.  Hgb stable at 8.1.  INR trending down but still 2.2.  MAP stable, LVAD parameters reviewed and stable.  - Transfuse hgb < 8.  No overt GI bleeding, suspect drop is related in part to surgical site hematoma.   Will continue supportive care for left arm for now.  INR trending down.  As the arm is not improving much (still swollen and painful), Dr. Donzetta Matters may take him back to OR tomorrow for exploration/hematoma evacuation if INR continues to come down.  Reassess in am.  Will keep NPO at midnight.   [name] 08/16/2018 8:40 AM

## 2018-08-16 NOTE — Progress Notes (Signed)
[facility] Progress Note   Subjective:  No c/o's, seen in room, arm a little less painful  Objective Vitals:   08/16/18 0454 08/16/18 0531 08/16/18 0733 08/16/18 0829  BP: 95/81  103/85 101/67  Pulse: 89  86   Resp: (!) 21  19   Temp: 98.8 F (37.1 C) 99 F (37.2 C) 98.7 F (37.1 C) 97.8 F (36.6 C)  TempSrc: Oral Oral Oral Oral  SpO2: 100%  100%   Weight:  80.7 kg    Height:       Physical Exam General: Well appearing man, NAD Heart: Continuous LAD hum Lungs: CTA anteriorly Extremities: No LE edema Dialysis Access: TDC in use, new LUE AVG + arm edema/ecchymosis  Additional Objective Labs: Basic Metabolic Panel: Recent Labs  Lab 08/14/18 0245 08/15/18 0311 08/16/18 0208  NA 136 131* 132*  K 4.2 4.0 3.6  CL 95* 93* 94*  CO2 26 25 24   GLUCOSE 99 217* 275*  BUN 19 36* 23*  CREATININE 5.85* 8.56* 4.87*  CALCIUM 8.4* 8.1* 8.2*   Liver Function Tests: No results for input(s): AST, ALT, ALKPHOS, BILITOT, PROT, ALBUMIN in the last 168 hours. CBC: Recent Labs  Lab 08/12/18 0311 08/13/18 0300 08/14/18 0245 08/15/18 0311 08/16/18 0208  WBC 14.5* 15.5* 15.3* 14.5* 14.4*  HGB 8.1* 7.0* 9.2* 8.2* 8.1*  HCT 26.8* 22.1* 29.0* 27.1* 25.7*  MCV 90.2 89.5 90.1 90.3 90.2  PLT 184 189 241 248 271   Blood Culture    Component Value Date/Time   SDES BLOOD SITE NOT SPECIFIED 08/10/2018 0906   SPECREQUEST  08/10/2018 0906    Blood Culture adequate volume BOTTLES DRAWN AEROBIC ONLY   CULT  08/10/2018 0906    NO GROWTH 5 DAYS Performed at [facility], [place] [address], [place], [place] [address]    REPTSTATUS 08/15/2018 FINAL 08/10/2018 0906   CBG: Recent Labs  Lab 08/14/18 2109 08/15/18 1206 08/15/18 1642 08/15/18 2121 08/16/18 0732  GLUCAP 171* 204* 271* 143* 210*   Medications: . sodium chloride    . sodium chloride 10 mL/hr at 08/14/18 0600  . ceFEPime (MAXIPIME) IV 2 g (08/15/18 1333)   . sodium chloride   Intravenous Once  .  amiodarone  200 mg Oral Daily  . atorvastatin  40 mg Oral q1800  . calcitRIOL  0.5 mcg Oral Q M,W,F  . Chlorhexidine Gluconate Cloth  6 each Topical Q0600  . citalopram  20 mg Oral Daily  . [START ON 08/17/2018] darbepoetin (ARANESP) injection - DIALYSIS  100 mcg Intravenous Q Fri-HD  . docusate sodium  100 mg Oral BID  . dronabinol  2.5 mg Oral BID AC  . erythromycin  250 mg Oral TID  . feeding supplement (ENSURE ENLIVE)  237 mL Oral BID BM  . insulin aspart  0-5 Units Subcutaneous QHS  . insulin aspart  0-9 Units Subcutaneous TID WC  . insulin aspart  3 Units Subcutaneous TID WC  . insulin glargine  8 Units Subcutaneous Daily  . isosorbide-hydrALAZINE  1 tablet Oral 3 times per day on Sun Tue Thu Sat  . metoCLOPramide  10 mg Oral TID  . multivitamin  1 tablet Oral Daily  . pantoprazole  40 mg Oral BID  . polyethylene glycol  17 g Oral Daily  . sevelamer carbonate  1,600 mg Oral 2 times per day  . sevelamer carbonate  4,000 mg Oral TID WC  . sodium chloride flush  3 mL Intravenous Q12H  Dialysis Orders: MWF [facility] 4h 6mn 400/800  80kg (raising here ^80.5kg)   2K/3Ca bath   Hep none  TDC/ new LUA AVG (12/5, maturing) - Calcitriol 139m PO q HD - No ESA recently, Hgb > 11  Assessment/Plan: 1. New dialysis access:S/p LUE AVG 12/5 by Dr. CaDonzetta Mattersnow with large amount swelling, prob hematoma formation.  VVS following, possible OR tomorrow.  2. ESRD on HD: cont HD on MWF schedule. HD tomorrow. Plan is to raise EDW up gradually, to 80.5kg for now. 3. Hypertension/volume:BP controlled, no edema on exam  4. Anemia:Hgb down to 8's, last given Aranesp 12/6, next dose due 12/13 and dose ^'d to 100ug. 1U PRBCs given 1297/35. Metabolic bone disease:Ca ok, Phos pending. Continue home binders. 6. T2DM: Per primary 7. CAD/severe ICM with Heartmate 3 LVAD: Per HF team.   RoKelly SplinterD [facility] pager [phone] 08/16/2018, 11:19 AM

## 2018-08-16 NOTE — Progress Notes (Signed)
LVAD Coordinator Rounding Note:  Pt admitted 08/08/18 for preoperative anticoagulation management for 2nd state of LUE AV fistula (scheduled 08/09/18.)   HM III LVAD implanted on 06/12/17 by Dr. Prescott Gum under Destination Therapy criteria due to renal insufficiency. Pump exchange on 03/01/18 for outflow graft twist/obstruction.   Pt in bed this afternoon. Still c/o left arm pain but pt states his pain is better than yesterday. Pt is NPO after midnight for possible evacuation of left arm hematoma tomorrow.  Vital signs: Temp: 97.8 HR:  86 Doppler Pressure: 84 Auto BP: 101/67 (67) O2 Sat: 100% on RA Wt: 80.5>80.3>82.3>80.1>79.9>80.1>80.7kg  LVAD interrogation reveals:  Speed: 5500 Flow: 4.2 Power: 4.3 w PI: 3.2  Alarms: none Events: none Hematocrit: 26 Fixed speed: 5500 Low speed limit: 5200  Drive Line:  CDI. Weekly dressing kits. Next dressing is due 08/17/18.  Labs: LDH trend: 238>196>175>166>171>165>195  INR trend: 1.39>1.33>3.27>1.96>3.45>2.23  Blood Products:  08/13/18>> 1 u/PRBC   Anticoagulation Plan: - INR Goal: 2.0 - 3.0 - ASA Dose: 81 mg daily   Device: -St Jude dual  -Therapies: on 200 bpm - Monitor: on VT 150 bpm  Adverse Events on VAD: - ESRD post LVAD; started CVVH 06/15/17; HD started 06/20/17 - Hospitalization 11/5 - 07/16/17 > gastroparesis diagnosis after EGD - Hospitalization 11/26 - 08/04/17 > gastroparesis - Hospitalization 1/8 - 09/16/17>gastroparesis - referred to Dr. Corliss Parish at [facility] - 10/31/17>rightupper arm arteriovenous graft with Artegraft; ligation of right first stage brachial vein transposition  - 12/11/13 elective admission for thrombectomy for AV graft in RUA  - 12/20/17 > intractable N/V - 01/19/18 > intractable N/V sent by EMS from dialysis center - 03/01/18 > outflow graft twist/occulsion requiring pump exchange - 05/23/18 > admitted for non-working fistula in RUA - 06/10/18 > admitted for intractable N/V   Plan/Recommendations:  1.  Please page VAD coordinator with any equipment questions or concerns. 2. VAD Coordinator will accompany pt to the OR for hematoma evacuation. NPO after midnight for possible surgery tomorrow.  [name] RN [facility] Coordinator  Office: [phone]  24/7 Pager: [phone]

## 2018-08-16 NOTE — Progress Notes (Signed)
  Progress Note    08/16/2018 1:36 PM 7 Days Post-Op  Subjective: Arm pain mildly improving  Vitals:   08/16/18 1133 08/16/18 1200  BP: 107/82 102/78  Pulse: 75   Resp: 17   Temp: 98 F (36.7 C)   SpO2: 99%     Physical Exam: Awake alert oriented Left upper extremity remains edematous although somewhat softer in the upper arm.  There is no palpable thrill or pulsatility in the graft  CBC    Component Value Date/Time   WBC 14.4 (H) 08/16/2018 0208   RBC 2.85 (L) 08/16/2018 0208   HGB 8.1 (L) 08/16/2018 0208   HGB 11.7 (L) 09/07/2016 1457   HCT 25.7 (L) 08/16/2018 0208   HCT 37.5 09/07/2016 1457   PLT 271 08/16/2018 0208   PLT 279 09/07/2016 1457   MCV 90.2 08/16/2018 0208   MCV 81 09/07/2016 1457   MCH 28.4 08/16/2018 0208   MCHC 31.5 08/16/2018 0208   RDW 14.7 08/16/2018 0208   RDW 14.9 09/07/2016 1457   LYMPHSABS 1.0 08/08/2018 1248   LYMPHSABS 1.3 09/07/2016 1457   MONOABS 0.6 08/08/2018 1248   EOSABS 0.2 08/08/2018 1248   EOSABS 0.2 09/07/2016 1457   BASOSABS 0.0 08/08/2018 1248   BASOSABS 0.0 09/07/2016 1457    BMET    Component Value Date/Time   NA 132 (L) 08/16/2018 0208   NA 144 05/11/2017 0938   K 3.6 08/16/2018 0208   CL 94 (L) 08/16/2018 0208   CO2 24 08/16/2018 0208   GLUCOSE 275 (H) 08/16/2018 0208   BUN 23 (H) 08/16/2018 0208   BUN 68 (H) 05/11/2017 0938   CREATININE 4.87 (H) 08/16/2018 0208   CREATININE 1.63 (H) 11/09/2016 1505   CALCIUM 8.2 (L) 08/16/2018 0208   CALCIUM 8.7 06/27/2017 1152   GFRNONAA 13 (L) 08/16/2018 0208   GFRNONAA 48 (L) 11/09/2016 1505   GFRAA 15 (L) 08/16/2018 0208   GFRAA 55 (L) 11/09/2016 1505    INR    Component Value Date/Time   INR 2.23 08/16/2018 0208     Intake/Output Summary (Last 24 hours) at 08/16/2018 1336 Last data filed at 08/16/2018 0600 Gross per 24 hour  Intake 1054 ml  Output -  Net 1054 ml     Assessment:  52 y.o. male is s/p placement left upper arm AV graft with significant  hematoma.  Arm somewhat improved today with improved INR  Plan: Okay for anticoagulation We will continue to monitor arm daily but as he is making some improvement we would like to hold on the OR intervention if possible.   Idrissa Beville C. Donzetta Matters, MD [facility] Office: [phone] Pager: [phone]  08/16/2018 1:36 PM

## 2018-08-16 NOTE — Progress Notes (Addendum)
[place] for coumadin Indication: LVAD/Factor 5   Allergies  Allergen Reactions  . Metformin And Related Diarrhea    Patient Measurements: Height: 5' 7"  (170.2 cm) Weight: 177 lb 14.6 oz (80.7 kg) IBW/kg (Calculated) : 66.1 Heparin Dosing Weight: 80.5 kg  Vital Signs: Temp: 98.7 F (37.1 C) (12/12 0733) Temp Source: Oral (12/12 0733) BP: 103/85 (12/12 0733) Pulse Rate: 86 (12/12 0733)  Labs: Recent Labs    08/14/18 0245 08/15/18 0311 08/16/18 0208  HGB 9.2* 8.2* 8.1*  HCT 29.0* 27.1* 25.7*  PLT 241 248 271  LABPROT 38.4* 34.2* 24.4*  INR 4.00 3.45 2.23  CREATININE 5.85* 8.56* 4.87*    Estimated Creatinine Clearance: 17.8 mL/min (A) (by C-G formula based on SCr of 4.87 mg/dL (H)).   Medical History: Past Medical History:  Diagnosis Date  . Angina   . ASCVD (arteriosclerotic cardiovascular disease)    , Anterior infarction 2005, LAD diagonal bifurcation intervention 03/2004  . Automatic implantable cardiac defibrillator -St. Jude's       . Benign neoplasm of colon   . CHF (congestive heart failure) ([place])   . Chronic systolic heart failure ([place])   . Coronary artery disease     Widely patent previously placed stents in the left anterior   . Crohn's disease ([place])   . 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 Dialysis patient ([place])   . Dyspnea   . Gastroparesis   . GERD (gastroesophageal reflux disease)   . High cholesterol   . Hyperlipidemia   . Hypersomnolent    Previous diagnosis of narcolepsy  . Hypertension, essential   . Ischemic cardiomyopathy    Ejection fraction 15-20% catheterization 2010  . Type II or unspecified type diabetes mellitus without mention of complication, not stated as uncontrolled   . Unspecified gastritis and gastroduodenitis without mention of hemorrhage     Assessment: 72 yom with hx of Heartmate 3 LVAD and ESRD, plan for LUE AVF placement on 12/5. On warfarin PTA - last  dose taken on 12/1. INR 1.39 on admission. Underwent creation of AVF on 12/5 followed by cardiac cath. Pt started on IV heparin bridge while INR subtherapeutic.  INR down to 2.2 this am, hgb down to 8.1 (transfusion 12/9). Will continue to hold warfarin, started on nutrition supplements in hopes of dropping INR slowly. LUE hematoma stable, seen by VVS who would like to evacuate once INR trends down.  Goal of Therapy:  INR 2-3 Monitor platelets by anticoagulation protocol: Yes   Plan:  -Hold warfarin for now -follow up in am  Erin Hearing PharmD., BCPS Clinical Pharmacist 08/16/2018 8:20 AM  Addendum Hematoma improved this morning, after D/w Dr.Curtner will restart warfarin tonight with no plans for evacuation/surgery. NPO cancelled.   Will give modest dose of warfarin tonight given recent supratherapeutic INR/hematoma.   08/16/2018 1:38 PM

## 2018-08-17 LAB — LACTATE DEHYDROGENASE: LDH: 189 U/L (ref 98–192)

## 2018-08-17 LAB — BASIC METABOLIC PANEL
Anion gap: 15 (ref 5–15)
BUN: 41 mg/dL — AB (ref 6–20)
CO2: 26 mmol/L (ref 22–32)
Calcium: 8.4 mg/dL — ABNORMAL LOW (ref 8.9–10.3)
Chloride: 92 mmol/L — ABNORMAL LOW (ref 98–111)
Creatinine, Ser: 6.98 mg/dL — ABNORMAL HIGH (ref 0.61–1.24)
GFR calc Af Amer: 9 mL/min — ABNORMAL LOW (ref >60)
GFR calc non Af Amer: 8 mL/min — ABNORMAL LOW (ref >60)
Glucose, Bld: 183 mg/dL — ABNORMAL HIGH (ref 70–99)
Potassium: 3.7 mmol/L (ref 3.5–5.1)
Sodium: 133 mmol/L — ABNORMAL LOW (ref 135–145)

## 2018-08-17 LAB — GLUCOSE, CAPILLARY
Glucose-Capillary: 190 mg/dL — ABNORMAL HIGH (ref 70–99)
Glucose-Capillary: 151 mg/dL — ABNORMAL HIGH (ref 70–99)
Glucose-Capillary: 112 mg/dL — ABNORMAL HIGH (ref 70–99)
Glucose-Capillary: 282 mg/dL — ABNORMAL HIGH (ref 70–99)

## 2018-08-17 LAB — CBC
HEMATOCRIT: 25.8 % — AB (ref 39.0–52.0)
Hemoglobin: 8.1 g/dL — ABNORMAL LOW (ref 13.0–17.0)
MCH: 28.2 pg (ref 26.0–34.0)
MCHC: 31.4 g/dL (ref 30.0–36.0)
MCV: 89.9 fL (ref 80.0–100.0)
Platelets: 275 10*3/uL (ref 150–400)
RBC: 2.87 MIL/uL — AB (ref 4.22–5.81)
RDW: 14.6 % (ref 11.5–15.5)
WBC: 15.3 10*3/uL — ABNORMAL HIGH (ref 4.0–10.5)
nRBC: 0 % (ref 0.0–0.2)

## 2018-08-17 LAB — PROTIME-INR
INR: 2.11
Prothrombin Time: 23.4 seconds — ABNORMAL HIGH (ref 11.4–15.2)

## 2018-08-17 MED ORDER — HEPARIN SODIUM (PORCINE) 1000 UNIT/ML IJ SOLN
INTRAMUSCULAR | Status: AC
  Filled 2018-08-17: qty 4

## 2018-08-17 MED ORDER — DARBEPOETIN ALFA 100 MCG/0.5ML IJ SOSY
PREFILLED_SYRINGE | INTRAMUSCULAR | Status: AC
  Filled 2018-08-17: qty 0.5

## 2018-08-17 MED ORDER — WARFARIN SODIUM 4 MG PO TABS
4.0000 mg | ORAL_TABLET | Freq: Once | ORAL | Status: AC
  Administered 2018-08-17: 4 mg via ORAL
  Filled 2018-08-17: qty 1

## 2018-08-17 NOTE — Progress Notes (Addendum)
  Progress Note    08/17/2018 9:12 AM 8 Days Post-Op  Subjective:  Says the pain in his arm will sometimes be better and sometimes be worse.  He has been vomiting this morning.  afebrile  Vitals:   08/17/18 0323 08/17/18 0746  BP: 101/78 103/73  Pulse: 81 77  Resp: 19 17  Temp: 98.1 F (36.7 C) (!) 96.7 F (35.9 C)  SpO2: 99% 98%    Physical Exam: General:  In no distress Lungs:  Non labored Extremities:  Left upper arm still with swelling; unable to palpate thrill in graft   CBC    Component Value Date/Time   WBC 15.3 (H) 08/17/2018 0310   RBC 2.87 (L) 08/17/2018 0310   HGB 8.1 (L) 08/17/2018 0310   HGB 11.7 (L) 09/07/2016 1457   HCT 25.8 (L) 08/17/2018 0310   HCT 37.5 09/07/2016 1457   PLT 275 08/17/2018 0310   PLT 279 09/07/2016 1457   MCV 89.9 08/17/2018 0310   MCV 81 09/07/2016 1457   MCH 28.2 08/17/2018 0310   MCHC 31.4 08/17/2018 0310   RDW 14.6 08/17/2018 0310   RDW 14.9 09/07/2016 1457   LYMPHSABS 1.0 08/08/2018 1248   LYMPHSABS 1.3 09/07/2016 1457   MONOABS 0.6 08/08/2018 1248   EOSABS 0.2 08/08/2018 1248   EOSABS 0.2 09/07/2016 1457   BASOSABS 0.0 08/08/2018 1248   BASOSABS 0.0 09/07/2016 1457    BMET    Component Value Date/Time   NA 133 (L) 08/17/2018 0310   NA 144 05/11/2017 0938   K 3.7 08/17/2018 0310   CL 92 (L) 08/17/2018 0310   CO2 26 08/17/2018 0310   GLUCOSE 183 (H) 08/17/2018 0310   BUN 41 (H) 08/17/2018 0310   BUN 68 (H) 05/11/2017 0938   CREATININE 6.98 (H) 08/17/2018 0310   CREATININE 1.63 (H) 11/09/2016 1505   CALCIUM 8.4 (L) 08/17/2018 0310   CALCIUM 8.7 06/27/2017 1152   GFRNONAA 8 (L) 08/17/2018 0310   GFRNONAA 48 (L) 11/09/2016 1505   GFRAA 9 (L) 08/17/2018 0310   GFRAA 55 (L) 11/09/2016 1505    INR    Component Value Date/Time   INR 2.11 08/17/2018 0310     Intake/Output Summary (Last 24 hours) at 08/17/2018 0912 Last data filed at 08/17/2018 0746 Gross per 24 hour  Intake 237 ml  Output 0 ml  Net  237 ml     Assessment:  52 y.o. male is s/p:  placement left upper arm AV graft with significant hematoma  8 Days Post-Op  Plan: -will continue to monitor arm; hopefully it will continue to improve and hopefully will not need surgical intervention.   Leontine Locket, PA-C [facility] [phone] 08/17/2018 9:12 AM  I have independently interviewed and examined patient and agree with PA assessment and plan above.  Left upper extremity continues to demonstrate small improvements and he has improved range of motion in his left elbow.  We will continue to follow next week in case patient does need hematoma evacuation.  He will be a very high risk for this to recur given need for ongoing anticoagulation and he understands this.  Saleh Ulbrich C. Donzetta Matters, MD [facility] [place] Office: [phone] Pager: [phone]

## 2018-08-17 NOTE — Progress Notes (Signed)
Transported to hemodialysis by bed awake and alert.

## 2018-08-17 NOTE — Progress Notes (Signed)
[name] for coumadin Indication: LVAD/Factor 5   Allergies  Allergen Reactions  . Metformin And Related Diarrhea    Patient Measurements: Height: 5' 7"  (170.2 cm) Weight: 181 lb (82.1 kg) IBW/kg (Calculated) : 66.1 Heparin Dosing Weight: 80.5 kg  Vital Signs: Temp: 96.7 F (35.9 C) (12/13 0746) Temp Source: Tympanic (12/13 0746) BP: 103/73 (12/13 0746) Pulse Rate: 77 (12/13 0746)  Labs: Recent Labs    08/15/18 0311 08/16/18 0208 08/17/18 0310  HGB 8.2* 8.1* 8.1*  HCT 27.1* 25.7* 25.8*  PLT 248 271 275  LABPROT 34.2* 24.4* 23.4*  INR 3.45 2.23 2.11  CREATININE 8.56* 4.87* 6.98*    Estimated Creatinine Clearance: 12.6 mL/min (A) (by C-G formula based on SCr of 6.98 mg/dL (H)).   Medical History: Past Medical History:  Diagnosis Date  . Angina   . ASCVD (arteriosclerotic cardiovascular disease)    , Anterior infarction 2005, LAD diagonal bifurcation intervention 03/2004  . Automatic implantable cardiac defibrillator -St. Jude's       . Benign neoplasm of colon   . CHF (congestive heart failure) ([place])   . Chronic systolic heart failure ([place])   . Coronary artery disease     Widely patent previously placed stents in the left anterior   . Crohn's disease ([place])   . 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 Dialysis patient ([place])   . Dyspnea   . Gastroparesis   . GERD (gastroesophageal reflux disease)   . High cholesterol   . Hyperlipidemia   . Hypersomnolent    Previous diagnosis of narcolepsy  . Hypertension, essential   . Ischemic cardiomyopathy    Ejection fraction 15-20% catheterization 2010  . Type II or unspecified type diabetes mellitus without mention of complication, not stated as uncontrolled   . Unspecified gastritis and gastroduodenitis without mention of hemorrhage     Assessment: 45 yom with hx of Heartmate 3 LVAD and ESRD, plan for LUE AVF placement on 12/5. On warfarin PTA - last dose  taken on 12/1. INR 1.39 on admission. Underwent creation of AVF on 12/5 followed by cardiac cath. Pt started on IV heparin bridge while INR subtherapeutic.  INR slower trend down this am at 2.1, hgb stable at 8.1 (transfused 12/9). No further plans to evacuate hematoma with slow improvement. Warfarin restarted 12/12. No bleeding issues noted overnight.    Goal of Therapy:  INR 2-3 Monitor platelets by anticoagulation protocol: Yes   Plan:  Warfarin 23m tonight in hopes of curbing INR trend and keep him close to 2 for now Daily INR for now   FErin HearingPharmD., BCPS Clinical Pharmacist 08/17/2018 8:36 AM

## 2018-08-17 NOTE — Progress Notes (Addendum)
Patient ID: [name], male   DOB: Apr 12, 1966, 52 y.o.   MRN: [id number]   Advanced Heart Failure VAD Team Note  PCP-Cardiologist: No primary care provider on file.   Subjective:    08/09/18 underwent US-guided cannulation of LUE AV fistula and found to be sclerotic and not suitable for dialysis. LUE graft created from brachial artery to axillary vein. Coumadin resumed.   12/7, he developed significant swelling in his left arm and ecchymosis left inner upper arm. Very painful. Seen by VVS, suspect hematoma.  Recommended elevation and wrapping, but wrapping was too painful for him. He was off coumadin since 12/8. Coumadin was restarted 12/12.   INR down to 3.4>2.2 >2.1   LUE arm with less pain. Denies nausea. Continues to take fentanyl and ultram.   LVAD Interrogation HM 3: Speed: 5500 Flow: 4  PI: 3.5  Power: 4.  No PI events.   [place] 08/09/18  RA mean 5 RV 34/6 PA 33/9, mean 18 PCWP mean 7 Oxygen saturations: PA 70% AO 100% Cardiac Output (Fick) 6.28  Cardiac Index (Fick) 3.26 PVR 1.75 WU Cardiac Output (Thermo) 5.56 Cardiac Index (Thermo) 2.89 PVR 1.98 WU  Objective:    Vital Signs:   Temp:  [97.8 F (36.6 C)-98.3 F (36.8 C)] 98.1 F (36.7 C) (12/13 0323) Pulse Rate:  [72-81] 81 (12/13 0323) Resp:  [17-19] 19 (12/13 0323) BP: (98-118)/(67-86) 101/78 (12/13 0323) SpO2:  [97 %-99 %] 99 % (12/13 0323) Weight:  [82.1 kg] 82.1 kg (12/13 0624) Last BM Date: 08/15/18 Mean arterial Pressure 80-90s   Intake/Output:   Intake/Output Summary (Last 24 hours) at 08/17/2018 0746 Last data filed at 08/17/2018 0100 Gross per 24 hour  Intake 237 ml  Output -  Net 237 ml     Physical Exam      Physical Exam: GENERAL: In bed. No acute distress. HEENT: normal  NECK: Supple, JVP 7-8   .  2+ bilaterally, no bruits.  No lymphadenopathy or thyromegaly appreciated.   CARDIAC:  Mechanical heart sounds with LVAD hum present.  LUNGS:  Clear to auscultation bilaterally.    ABDOMEN:  Soft, round, nontender, positive bowel sounds x4.     LVAD exit site: well-healed and incorporated.  Dressing dry and intact.  No erythema or drainage.  Stabilization device present and accurately applied.  Driveline dressing is being changed daily per sterile technique. EXTREMITIES:  Warm and dry, no cyanosis, clubbing, rash. LUE induration + edema  NEUROLOGIC:  Alert and oriented x 4.    No aphasia.  No dysarthria.  Affect pleasant.       Telemetry   NSR 80-90s   EKG    No new tracings.    Labs   Basic Metabolic Panel: Recent Labs  Lab 08/13/18 0300 08/14/18 0245 08/15/18 0311 08/16/18 0208 08/17/18 0310  NA 132* 136 131* 132* 133*  K 4.6 4.2 4.0 3.6 3.7  CL 95* 95* 93* 94* 92*  CO2 20* 26 25 24 26   GLUCOSE 127* 99 217* 275* 183*  BUN 32* 19 36* 23* 41*  CREATININE 9.67* 5.85* 8.56* 4.87* 6.98*  CALCIUM 8.1* 8.4* 8.1* 8.2* 8.4*    Liver Function Tests: No results for input(s): AST, ALT, ALKPHOS, BILITOT, PROT, ALBUMIN in the last 168 hours. No results for input(s): LIPASE, AMYLASE in the last 168 hours. No results for input(s): AMMONIA in the last 168 hours.  CBC: Recent Labs  Lab 08/13/18 0300 08/14/18 0245 08/15/18 0311 08/16/18 0208 08/17/18 0310  WBC  15.5* 15.3* 14.5* 14.4* 15.3*  HGB 7.0* 9.2* 8.2* 8.1* 8.1*  HCT 22.1* 29.0* 27.1* 25.7* 25.8*  MCV 89.5 90.1 90.3 90.2 89.9  PLT 189 241 248 271 275    INR: Recent Labs  Lab 08/13/18 0300 08/14/18 0245 08/15/18 0311 08/16/18 0208 08/17/18 0310  INR 3.27 4.00 3.45 2.23 2.11    Other results:  EKG:    Imaging   No results found.   Medications:     Scheduled Medications: . sodium chloride   Intravenous Once  . amiodarone  200 mg Oral Daily  . atorvastatin  40 mg Oral q1800  . calcitRIOL  0.5 mcg Oral Q M,W,F  . Chlorhexidine Gluconate Cloth  6 each Topical Q0600  . citalopram  20 mg Oral Daily  . darbepoetin (ARANESP) injection - DIALYSIS  100 mcg Intravenous Q Fri-HD   . docusate sodium  100 mg Oral BID  . dronabinol  2.5 mg Oral BID AC  . erythromycin  250 mg Oral TID  . feeding supplement (ENSURE ENLIVE)  237 mL Oral BID BM  . insulin aspart  0-5 Units Subcutaneous QHS  . insulin aspart  0-9 Units Subcutaneous TID WC  . insulin aspart  3 Units Subcutaneous TID WC  . insulin glargine  8 Units Subcutaneous Daily  . isosorbide-hydrALAZINE  1 tablet Oral 3 times per day on Sun Tue Thu Sat  . metoCLOPramide  10 mg Oral TID  . multivitamin  1 tablet Oral Daily  . pantoprazole  40 mg Oral BID  . polyethylene glycol  17 g Oral Daily  . sevelamer carbonate  1,600 mg Oral 2 times per day  . sevelamer carbonate  4,000 mg Oral TID WC  . sodium chloride flush  3 mL Intravenous Q12H  . Warfarin - Pharmacist Dosing Inpatient   Does not apply q1800    Infusions: . sodium chloride    . sodium chloride 10 mL/hr at 08/14/18 0600  . ceFEPime (MAXIPIME) IV 2 g (08/15/18 1333)    PRN Medications: sodium chloride, acetaminophen, docusate sodium, fentaNYL (SUBLIMAZE) injection, heparin, hydrALAZINE, lidocaine-prilocaine, LORazepam, ondansetron (ZOFRAN) IV, promethazine, sodium chloride flush, traMADol   Patient Profile   [name] is a52 y.o.with history of CAD s/p anterior MI in 2010 and NSTEMI in 3/18 with DES to pRCA and mLAD, diabetic, gastroparesis, Chrons disease, DMIIand ischemic cardiomyopathy, 10/2018s/p Heartmate 3 LVAD (emergent exchange 02/2018 due to outflow graft kink) and ESRD (HD MWF). He has LUE AVR 05/2018.  He is being admitted for peri-op management of 2nd stage of AVF.  Assessment/Plan:    1. Chronic systolic CHF: Ischemic cardiomyopathy. St Jude ICD. NSTEMI in March 2018 with DES to LAD and RCA, complicated by cardiogenic shock, low output requiring milrinone. Echo 8/18 with EF 20-25%, moderate [name]. CPX (8/18) with severe functional impairment due to HF. He is s/p HM3 LVAD placement. He had pump thrombosis 6/19 due to [facility] outflow graft  kinking. He had pump exchange for another HM3. RHC showed low filling pressures and good cardiac output this admission.  - LDH stable.  - Discussed increasing HD dry weight with Dr. Posey Pronto.   - Continue Bidil 1 tab TID on non-HD days.  - Hold ASA for now with hematoma, restart prior to discharge.   - INR stable 2.1.  Warfarin restarted, would cover with heparin gtt if INR < 1.8.   - He is now listed for heart/kidney at [facility]. [place] 08/09/18 with optimized filling pressures and cardiac output. Normal PA  pressure.  2. ESRD: Developed peri-operatively after HM3 placement. He is tolerating HD, currently via catheter but had LUE graft placed this admission.  Hematoma post-AV LUE graft placement.  VVS saw, suspects hematoma.There question about graft patency.  - VVS continues to follow. LUE Hematoma seems a little better. I have asked him to elevate. Plan for conservative observation for now. -Coumadin was restarted 12/12. INR 2.1.  - MWF HD - As above, discussed increasing dry weight with Dr. Posey Pronto based on [place] and low flows at dialysis last week => allowing greater dry weight, he had no PI events/low flows with HD here.   3. CAD: NSTEMI in 3/18. LHC with 99% ulcerated lesion proximal RCA with left to right collaterals, 95% mid LAD stenosis after mid LAD stent. s/p PCI to RCA and LAD on 11/25/16.  - No chest pain.   - Continue statin. 4. h/o DVTs: Factor V Leiden heterozygote.  5. Diabetic gastroparesis: Difficult to manage. He has seen gastroparesis specialist at [facility]. Surprisingly, gastric emptying study was normal. However, electrogastrogram showed poor gastric accomodation/capacity. Therefore, small frequent meals recommended. Also morning nausea thought to be GERD and Protonix was increased. Nausea seems to drive markedly high BP (resolves with nausea control).  No issues.  Continue current regimen.   - Continue reglan 5 mg tid/ac, chronic erythromycin, and Marinol.  - Continue  Protonix BID.   - Make effort to eat small, more frequent meals.  6. DM: HgbA1c well-controlled recently. No change.   7. HTN:  Stable.  Continue hydralazine today (non-HD day).  Hypertension seems to be driven by nausea (see above), so will try to minimize nausea.  8. PAD: Moderate bilateral disease on ABIs in 8/18. Denies claudication currently, no pedal ulcers. No change.   9. Atrial flutter: Paroxysmal. Remains in NSR.    10. Fever: Afebrile. WBC 15.2 .maybe related to hematoma. Blood Cultures negative. CXR with possible left lingular infiltrate versus atelectasis.  Driveline site ok. Have been treating for PNA with cefepime. He has been on cefepime 5 days and vancomycin 4 days. Stop vancomycin.  Stop cefepime 08/17/18.   11. Anemia: Received 1PURBc 12/9. Hgb stable 8.2>8.1. LUE arm hematoma appears stable.   I reviewed the LVAD parameters from today, and compared the results to the patient's prior recorded data.  No programming changes were made.  The LVAD is functioning within specified parameters.  The patient performs LVAD self-test daily.  LVAD interrogation was negative for any significant power changes, alarms or PI events/speed drops.  LVAD equipment check completed and is in good working order.  Back-up equipment present.   LVAD education done on emergency procedures and precautions and reviewed exit site care.   Length of Stay: [place], NP 08/17/2018, 7:46 AM  VAD Team --- VAD ISSUES ONLY--- Pager [phone] (7am - 7am)  Advanced Heart Failure Team  Pager [phone] (M-F; 7a - 4p)  Please contact [facility] for night-coverage after hours (4p -7a ) and weekends on [email or web address]  Patient seen with NP, agree with the above note.   Left arm pain slowly improving. Now back on warfarin, INR 2.1.  Would hold off on hematoma evacuation given slow improvement.  Hgb stable.   He is afebrile.  Today is his last day of cefepime for suspected HCAP.   Possibly home over weekend if  left arm continues to improve.   HD today.   [name] 08/17/2018

## 2018-08-17 NOTE — Progress Notes (Signed)
LVAD Coordinator Rounding Note:  Pt admitted 08/08/18 for preoperative anticoagulation management for 2nd state of LUE AV fistula (scheduled 08/09/18.)   HM III LVAD implanted on 06/12/17 by Dr. Prescott Gum under Destination Therapy criteria due to renal insufficiency. Pump exchange on 03/01/18 for outflow graft twist/obstruction.   Pt in bed this morning. Did not eat breakfast or drink any fluids, says he feels nauseated; starting to have dry heaves. Nurse notified and asked if pt can have prn for nausea.  Still c/o left arm pain but pt states his pain is better than yesterday.  Pt scheduled for dialysis today, doesn't know what time it will be performed.   Vital signs: Temp: 98.4 HR:  86 Doppler Pressure: not done Auto BP: 103/73 (83) O2 Sat: 98% on RA Wt: 80.5>80.3>82.3>80.1>79.9>80.1>80.7>82.1 kg  LVAD interrogation reveals:  Speed: 5500 Flow: 4.1 Power: 4.4w PI: 3.3 Alarms: none Events: none Hematocrit: 27 Fixed speed: 5500 Low speed limit: 5200  Drive Line:  CDI. Weekly dressing kits. Existing VAD dressing removed and site care performed using sterile technique. Drive line exit site cleaned with Chlora prep applicators x 2, allowed to dry, and Sorbaview dressing with bio patch re-applied. Exit site healed and incorporated, the velour is fully implanted at exit site. Exit site with some dry, crusty drainage; no redness, tenderness, foul odor or rash noted. Drive line anchor re-applied. Pt denies fever or chills. Next dressing change will be due 08/24/18; bedside nurse may change dressing.   Labs: LDH trend: 238>196>175>166>171>165>195  INR trend: 1.39>1.33>3.27>1.96>3.45>2.23>2.11  Blood Products:  08/13/18>> 1 u/PRBC  Anticoagulation Plan: - INR Goal: 2.0 - 3.0 - ASA Dose: 81 mg daily   Device: -St Jude dual  -Therapies: on 200 bpm - Monitor: on VT 150 bpm  Adverse Events on VAD: - ESRD post LVAD; started CVVH 06/15/17; HD started 06/20/17 - Hospitalization 11/5 -  07/16/17 > gastroparesis diagnosis after EGD - Hospitalization 11/26 - 08/04/17 > gastroparesis - Hospitalization 1/8 - 09/16/17>gastroparesis - referred to Dr. Corliss Parish at [facility] - 10/31/17>rightupper arm arteriovenous graft with Artegraft; ligation of right first stage brachial vein transposition  - 12/11/13 elective admission for thrombectomy for AV graft in RUA  - 12/20/17 > intractable N/V - 01/19/18 > intractable N/V sent by EMS from dialysis center - 03/01/18 > outflow graft twist/occulsion requiring pump exchange - 05/23/18 > admitted for non-working fistula in RUA - 06/10/18 > admitted for intractable N/V   Plan/Recommendations:  1. Please page VAD coordinator with any equipment questions or concerns. 2. VAD Coordinator will accompany pt to the OR for hematoma evacuation.   Zada Girt RN [place] Coordinator  Office: [phone]  24/7 Pager: [phone]

## 2018-08-17 NOTE — Plan of Care (Signed)
  Problem: Clinical Measurements: Goal: Ability to maintain clinical measurements within normal limits will improve Outcome: Progressing Goal: Diagnostic test results will improve Outcome: Progressing Goal: Cardiovascular complication will be avoided Outcome: Progressing   Problem: Activity: Goal: Risk for activity intolerance will decrease Outcome: Progressing   Problem: Nutrition: Goal: Adequate nutrition will be maintained Outcome: Progressing   Problem: Pain Managment: Goal: General experience of comfort will improve Outcome: Progressing   Problem: Safety: Goal: Ability to remain free from injury will improve Outcome: Progressing

## 2018-08-17 NOTE — Progress Notes (Signed)
Back from the hemodialysis dept by bed awake and alert.

## 2018-08-17 NOTE — Progress Notes (Signed)
[name] Progress Note   Subjective:  No c/o's, not having surg today.   Objective Vitals:   08/17/18 1425 08/17/18 1440 08/17/18 1445 08/17/18 1515  BP: (!) 86/58 103/83 99/73 114/83  Pulse: 85 82 83 87  Resp: (!) 22 20 19    Temp:      TempSrc:      SpO2:      Weight:      Height:       Physical Exam General: Well appearing man, NAD Heart: Continuous LAD hum Lungs: CTA anteriorly Extremities: No LE edema Dialysis Access: TDC in use, new LUE AVG + arm edema/ecchymosis    Dialysis Orders: MWF [facility] 4h 85mn 400/800  80kg (raising here ^80.5kg)   2K/3Ca bath   Hep none  TDC/ new LUA AVG (12/5, maturing) - Calcitriol 143m PO q HD - No ESA recently, Hgb > 11  Assessment/Plan: 1. New dialysis access:S/p LUE AVG 12/5 by Dr. CaDonzetta Mattersw/ large amount swelling, prob hematoma formation.  VVS following, no surg for now.  2. ESRD on HD: cont HD on MWF schedule. HD today.  Raising EDW up gradually, to 80.5kg for now, cardiology request.  3. Hypertension/volume:BP controlled, no edema on exam  4. Anemia:Hgb down to 8's, last given Aranesp 12/6, next dose due 12/13 and dose ^'d to 100ug. 1U PRBCs given 1268/35. Metabolic bone disease:Ca ok, Phos pending. Continue home binders. 6. T2DM: Per primary 7. CAD/severe ICM with Heartmate 3 LVAD: Per HF team.   [name] SplinterD [name] [phone] 08/17/2018, 3:18 PM  Additional Objective Labs: Basic Metabolic Panel: Recent Labs  Lab 08/15/18 0311 08/16/18 0208 08/17/18 0310  NA 131* 132* 133*  K 4.0 3.6 3.7  CL 93* 94* 92*  CO2 25 24 26   GLUCOSE 217* 275* 183*  BUN 36* 23* 41*  CREATININE 8.56* 4.87* 6.98*  CALCIUM 8.1* 8.2* 8.4*   Liver Function Tests: No results for input(s): AST, ALT, ALKPHOS, BILITOT, PROT, ALBUMIN in the last 168 hours. CBC: Recent Labs  Lab 08/13/18 0300 08/14/18 0245 08/15/18 0311 08/16/18 0208 08/17/18 0310  WBC 15.5* 15.3* 14.5* 14.4* 15.3*   HGB 7.0* 9.2* 8.2* 8.1* 8.1*  HCT 22.1* 29.0* 27.1* 25.7* 25.8*  MCV 89.5 90.1 90.3 90.2 89.9  PLT 189 241 248 271 275   Blood Culture    Component Value Date/Time   SDES BLOOD SITE NOT SPECIFIED 08/10/2018 0906   SPECREQUEST  08/10/2018 0906    Blood Culture adequate volume BOTTLES DRAWN AEROBIC ONLY   CULT  08/10/2018 0906    NO GROWTH 5 DAYS Performed at [facility] [place] [id number]  REPTSTATUS 08/15/2018 FINAL 08/10/2018 0906   CBG: Recent Labs  Lab 08/16/18 1132 08/16/18 1641 08/16/18 2152 08/17/18 0742 08/17/18 1128  GLUCAP 189* 163* 147* 190* 151*   Medications: . sodium chloride    . sodium chloride 10 mL/hr at 08/14/18 0600  . ceFEPime (MAXIPIME) IV 2 g (08/15/18 1333)   . sodium chloride   Intravenous Once  . amiodarone  200 mg Oral Daily  . atorvastatin  40 mg Oral q1800  . calcitRIOL  0.5 mcg Oral Q M,W,F  . Chlorhexidine Gluconate Cloth  6 each Topical Q0600  . citalopram  20 mg Oral Daily  . darbepoetin (ARANESP) injection - DIALYSIS  100 mcg Intravenous Q Fri-HD  . docusate sodium  100 mg Oral BID  . dronabinol  2.5 mg  Oral BID AC  . erythromycin  250 mg Oral TID  . feeding supplement (ENSURE ENLIVE)  237 mL Oral BID BM  . insulin aspart  0-5 Units Subcutaneous QHS  . insulin aspart  0-9 Units Subcutaneous TID WC  . insulin aspart  3 Units Subcutaneous TID WC  . insulin glargine  8 Units Subcutaneous Daily  . isosorbide-hydrALAZINE  1 tablet Oral 3 times per day on Sun Tue Thu Sat  . metoCLOPramide  10 mg Oral TID  . multivitamin  1 tablet Oral Daily  . pantoprazole  40 mg Oral BID  . polyethylene glycol  17 g Oral Daily  . sevelamer carbonate  1,600 mg Oral 2 times per day  . sevelamer carbonate  4,000 mg Oral TID WC  . sodium chloride flush  3 mL Intravenous Q12H  . warfarin  4 mg Oral ONCE-1800  . Warfarin - Pharmacist Dosing Inpatient   Does not apply [phone]

## 2018-08-18 LAB — BASIC METABOLIC PANEL
Anion gap: 9 (ref 5–15)
BUN: 16 mg/dL (ref 6–20)
CHLORIDE: 99 mmol/L (ref 98–111)
CO2: 31 mmol/L (ref 22–32)
Calcium: 8.4 mg/dL — ABNORMAL LOW (ref 8.9–10.3)
Creatinine, Ser: 3.7 mg/dL — ABNORMAL HIGH (ref 0.61–1.24)
GFR calc Af Amer: 20 mL/min — ABNORMAL LOW (ref >60)
GFR calc non Af Amer: 18 mL/min — ABNORMAL LOW (ref >60)
Glucose, Bld: 145 mg/dL — ABNORMAL HIGH (ref 70–99)
Potassium: 4.4 mmol/L (ref 3.5–5.1)
Sodium: 139 mmol/L (ref 135–145)

## 2018-08-18 LAB — GLUCOSE, CAPILLARY: Glucose-Capillary: 136 mg/dL — ABNORMAL HIGH (ref 70–99)

## 2018-08-18 LAB — CBC
HCT: 27.6 % — ABNORMAL LOW (ref 39.0–52.0)
Hemoglobin: 8.6 g/dL — ABNORMAL LOW (ref 13.0–17.0)
MCH: 28 pg (ref 26.0–34.0)
MCHC: 31.2 g/dL (ref 30.0–36.0)
MCV: 89.9 fL (ref 80.0–100.0)
Platelets: 289 10*3/uL (ref 150–400)
RBC: 3.07 MIL/uL — ABNORMAL LOW (ref 4.22–5.81)
RDW: 14.7 % (ref 11.5–15.5)
WBC: 12.4 10*3/uL — ABNORMAL HIGH (ref 4.0–10.5)
nRBC: 0.2 % (ref 0.0–0.2)

## 2018-08-18 LAB — LACTATE DEHYDROGENASE: LDH: 196 U/L — ABNORMAL HIGH (ref 98–192)

## 2018-08-18 LAB — PROTIME-INR
INR: 2.35
Prothrombin Time: 25.4 seconds — ABNORMAL HIGH (ref 11.4–15.2)

## 2018-08-18 MED ORDER — WARFARIN SODIUM 5 MG PO TABS: 5.0000 mg | ORAL_TABLET | ORAL | Status: DC

## 2018-08-18 MED ORDER — TRAMADOL HCL 50 MG PO TABS: 50.0000 mg | ORAL_TABLET | Freq: Four times a day (QID) | ORAL | 0 refills | Status: DC | PRN

## 2018-08-18 MED ORDER — ASPIRIN EC 81 MG PO TBEC: 81.0000 mg | DELAYED_RELEASE_TABLET | Freq: Every day | ORAL | 3 refills | Status: DC

## 2018-08-18 NOTE — Discharge Instructions (Signed)
Groin Site Care Refer to this sheet in the next few weeks. These instructions provide you with information on caring for yourself after your procedure. Your caregiver may also give you more specific instructions. Your treatment has been planned according to current medical practices, but problems sometimes occur. Call your caregiver if you have any problems or questions after your procedure. HOME CARE INSTRUCTIONS  You may shower 24 hours after the procedure. Remove the bandage (dressing) and gently wash the site with plain soap and water. Gently pat the site dry.   Do not apply powder or lotion to the site.   Do not sit in a bathtub, swimming pool, or whirlpool for 5 to 7 days.   No bending, squatting, or lifting anything over 10 pounds (4.5 kg) as directed by your caregiver.   Inspect the site at least twice daily.   Do not drive home if you are discharged the same day of the procedure. Have someone else drive you.   You may drive 24 hours after the procedure unless otherwise instructed by your caregiver.  What to expect:  Any bruising will usually fade within 1 to 2 weeks.   Blood that collects in the tissue (hematoma) may be painful to the touch. It should usually decrease in size and tenderness within 1 to 2 weeks.  SEEK IMMEDIATE MEDICAL CARE IF:  You have unusual pain at the groin site or down the affected leg.   You have redness, warmth, swelling, or pain at the groin site.   You have drainage (other than a small amount of blood on the dressing).   You have chills.   You have a fever or persistent symptoms for more than 72 hours.   You have a fever and your symptoms suddenly get worse.   Your leg becomes pale, cool, tingly, or numb.  You have heavy bleeding from the site. Hold pressure on the site. [name]

## 2018-08-18 NOTE — Progress Notes (Signed)
Discharged home by wheelchair, discharge instructions given to pt. Belongings taken home 

## 2018-08-18 NOTE — Progress Notes (Signed)
Patient ID: [name], male   DOB: March 09, 1966, 52 y.o.   MRN: [id number]   Advanced Heart Failure VAD Team Note  PCP-Cardiologist: No primary care provider on file.   Subjective:    08/09/18 underwent US-guided cannulation of LUE AV fistula and found to be sclerotic and not suitable for dialysis. LUE graft created from brachial artery to axillary vein. Coumadin resumed.   12/7, he developed significant swelling in his left arm and ecchymosis left inner upper arm. Very painful. Seen by VVS, suspect hematoma.  Recommended elevation and wrapping, but wrapping was too painful for him. He was off coumadin since 12/8. Coumadin was restarted 12/12.   INR therapeutic at 2.35.   Occasional nausea but not like one of his typical gastroparesis flares.  Afebrile.  Left arm gradually getting less tight.    LVAD Interrogation HM 3: Speed: 5500 Flow: 3.9  PI: 4.4 Power: 5.5.  No PI events.   [place] 08/09/18  RA mean 5 RV 34/6 PA 33/9, mean 18 PCWP mean 7 Oxygen saturations: PA 70% AO 100% Cardiac Output (Fick) 6.28  Cardiac Index (Fick) 3.26 PVR 1.75 WU Cardiac Output (Thermo) 5.56 Cardiac Index (Thermo) 2.89 PVR 1.98 WU  Objective:    Vital Signs:   Temp:  [97.9 F (36.6 C)-99.4 F (37.4 C)] 98.1 F (36.7 C) (12/14 0253) Pulse Rate:  [78-97] 80 (12/14 0746) Resp:  [15-25] 25 (12/14 0253) BP: (76-114)/(56-89) 103/89 (12/14 0253) SpO2:  [98 %] 98 % (12/14 0746) Weight:  [81 kg-82.1 kg] 81.5 kg (12/14 0616) Last BM Date: 08/15/18 Mean arterial Pressure 80s   Intake/Output:   Intake/Output Summary (Last 24 hours) at 08/18/2018 0755 Last data filed at 08/17/2018 1736 Gross per 24 hour  Intake 360 ml  Output 1026 ml  Net -666 ml     Physical Exam    General: Well appearing this am. NAD.  HEENT: Normal. Neck: Supple, JVP 7-8 cm. Carotids OK.  Cardiac:  Mechanical heart sounds with LVAD hum present.  Lungs:  CTAB, normal effort.  Abdomen:  NT, ND, no HSM. No bruits or  masses. +BS  LVAD exit site: Well-healed and incorporated. Dressing dry and intact. No erythema or drainage. Stabilization device present and accurately applied. Driveline dressing changed daily per sterile technique. Extremities:  Warm and dry. No cyanosis, clubbing, rash.  Left upper arm mildly swollen compared to right, not tight.  Neuro:  Alert & oriented x 3. Cranial nerves grossly intact. Moves all 4 extremities w/o difficulty. Affect pleasant    Telemetry   NSR 80-90s   EKG    No new tracings.    Labs   Basic Metabolic Panel: Recent Labs  Lab 08/14/18 0245 08/15/18 0311 08/16/18 0208 08/17/18 0310 08/18/18 0252  NA 136 131* 132* 133* 139  K 4.2 4.0 3.6 3.7 4.4  CL 95* 93* 94* 92* 99  CO2 26 25 24 26 31   GLUCOSE 99 217* 275* 183* 145*  BUN 19 36* 23* 41* 16  CREATININE 5.85* 8.56* 4.87* 6.98* 3.70*  CALCIUM 8.4* 8.1* 8.2* 8.4* 8.4*    Liver Function Tests: No results for input(s): AST, ALT, ALKPHOS, BILITOT, PROT, ALBUMIN in the last 168 hours. No results for input(s): LIPASE, AMYLASE in the last 168 hours. No results for input(s): AMMONIA in the last 168 hours.  CBC: Recent Labs  Lab 08/14/18 0245 08/15/18 0311 08/16/18 0208 08/17/18 0310 08/18/18 0252  WBC 15.3* 14.5* 14.4* 15.3* 12.4*  HGB 9.2* 8.2* 8.1* 8.1* 8.6*  HCT 29.0* 27.1* 25.7* 25.8* 27.6*  MCV 90.1 90.3 90.2 89.9 89.9  PLT 241 248 271 275 289    INR: Recent Labs  Lab 08/14/18 0245 08/15/18 0311 08/16/18 0208 08/17/18 0310 08/18/18 0252  INR 4.00 3.45 2.23 2.11 2.35    Other results:  EKG:    Imaging   No results found.   Medications:     Scheduled Medications: . sodium chloride   Intravenous Once  . amiodarone  200 mg Oral Daily  . atorvastatin  40 mg Oral q1800  . calcitRIOL  0.5 mcg Oral Q M,W,F  . Chlorhexidine Gluconate Cloth  6 each Topical Q0600  . citalopram  20 mg Oral Daily  . darbepoetin (ARANESP) injection - DIALYSIS  100 mcg Intravenous Q Fri-HD  .  docusate sodium  100 mg Oral BID  . dronabinol  2.5 mg Oral BID AC  . erythromycin  250 mg Oral TID  . feeding supplement (ENSURE ENLIVE)  237 mL Oral BID BM  . insulin aspart  0-5 Units Subcutaneous QHS  . insulin aspart  0-9 Units Subcutaneous TID WC  . insulin aspart  3 Units Subcutaneous TID WC  . insulin glargine  8 Units Subcutaneous Daily  . isosorbide-hydrALAZINE  1 tablet Oral 3 times per day on Sun Tue Thu Sat  . metoCLOPramide  10 mg Oral TID  . multivitamin  1 tablet Oral Daily  . pantoprazole  40 mg Oral BID  . polyethylene glycol  17 g Oral Daily  . sevelamer carbonate  1,600 mg Oral 2 times per day  . sevelamer carbonate  4,000 mg Oral TID WC  . sodium chloride flush  3 mL Intravenous Q12H  . Warfarin - Pharmacist Dosing Inpatient   Does not apply q1800    Infusions: . sodium chloride    . sodium chloride 10 mL/hr at 08/14/18 0600    PRN Medications: sodium chloride, acetaminophen, docusate sodium, fentaNYL (SUBLIMAZE) injection, heparin, hydrALAZINE, lidocaine-prilocaine, LORazepam, ondansetron (ZOFRAN) IV, promethazine, sodium chloride flush, traMADol   Patient Profile   Mr Rawdon is a65 y.o.with history of CAD s/p anterior MI in 2010 and NSTEMI in 3/18 with DES to pRCA and mLAD, diabetic, gastroparesis, Chrons disease, DMIIand ischemic cardiomyopathy, 10/2018s/p Heartmate 3 LVAD (emergent exchange 02/2018 due to outflow graft kink) and ESRD (HD MWF). He has LUE AVR 05/2018.  He is being admitted for peri-op management of 2nd stage of AVF.  Assessment/Plan:    1. Chronic systolic CHF: Ischemic cardiomyopathy. [employer] ICD. NSTEMI in March 2018 with DES to LAD and RCA, complicated by cardiogenic shock, low output requiring milrinone. Echo 8/18 with EF 20-25%, moderate MR. CPX (8/18) with severe functional impairment due to HF. He is s/p HM3 LVAD placement. He had pump thrombosis 6/19 due to [facility] outflow graft kinking. He had pump exchange for another HM3.  RHC showed low filling pressures and good cardiac output this admission. LDH stable.  - Have discussed increasing HD dry weight with Dr. Posey Pronto, this seems to be helping (no low flows with HD this time in the hospital).   - Continue Bidil 1 tab TID on non-HD days.  - Hold ASA for now with hematoma, restart on Monday.    - INR stable 2.35.  Warfarin restarted, goal INR 2-2.5.   - He is now listed for heart[facility]kid[facility] at [facility]. [place] 08/09/18 with optimized filling pressures and cardiac output. Normal PA pressure.  2. ESRD: Developed peri-operatively after HM3 placement. He is tolerating HD, currently  via catheter but had LUE graft placed this admission.  Hematoma post-AV LUE graft placement.  VVS saw, suspects hematoma.There is question about graft patency. LUE Hematoma gradually improving. I have asked him to elevate. Plan for conservative observation for now. - He can have tramadol at home for left arm pain.  -Coumadin was restarted 12/12. INR 2.35, goal 2-2.5.  Hold ASA until Monday.  - MWF HD - As above, discussed increasing dry weight with Dr. Posey Pronto based on [place] and low flows at dialysis prior to admission => allowing greater dry weight, he had no PI events/low flows with HD here.   3. CAD: NSTEMI in 3/18. LHC with 99% ulcerated lesion proximal RCA with left to right collaterals, 95% mid LAD stenosis after mid LAD stent. s/p PCI to RCA and LAD on 11/25/16.  - No chest pain.   - Continue statin. 4. h/o DVTs: Factor V Leiden heterozygote.  5. Diabetic gastroparesis: Difficult to manage. He has seen gastroparesis specialist at [facility]. Surprisingly, gastric emptying study was normal. However, electrogastrogram showed poor gastric accomodation/capacity. Therefore, small frequent meals recommended. Also morning nausea thought to be GERD and Protonix was increased. Nausea seems to drive markedly high BP (resolves with nausea control).  No issues.  Continue current regimen.   - Continue reglan 5 mg  tid/ac, chronic erythromycin, and Marinol.  - Continue Protonix BID.   - Phenergan prn.  - Make effort to eat small, more frequent meals.  6. DM: HgbA1c well-controlled recently. No change.   7. HTN: Stable.  Continue hydralazine today (non-HD day).  Hypertension seems to be driven by nausea (see above), so will try to minimize nausea.  8. PAD: Moderate bilateral disease on ABIs in 8/18. Denies claudication currently, no pedal ulcers. No change.   9. Atrial flutter: Paroxysmal. Remains in NSR.    10. Fever: Initially, now afebrile. Blood Cultures negative. CXR with possible left lingular infiltrate versus atelectasis.  Driveline site ok. He completed course of vancomycin/cefepime for possible HCAP.  11. Anemia: Received 1PURBc 12/9. Hgb stable 8.2>8.1>8.6. Suspect fall was due to left arm hematoma, LUE arm hematoma now improving.  12. Disposition: Home today.  Will need followup early next week in LVAD clinic and with VVS to reassess left arm.  Only med changes compared to prior to admission will be to hold ASA 81 daily until Monday (restart Monday) and would send home with tramadol 50 mg q6 hrs prn.  He will continue warfarin goal INR 2-2.5, should be checked Monday or Tuesday at [place] clinic.   I reviewed the LVAD parameters from today, and compared the results to the patient's prior recorded data.  No programming changes were made.  The LVAD is functioning within specified parameters.  The patient performs LVAD self-test daily.  LVAD interrogation was negative for any significant power changes, alarms or PI events/speed drops.  LVAD equipment check completed and is in good working order.  Back-up equipment present.   LVAD education done on emergency procedures and precautions and reviewed exit site care.   Length of Stay: [place], MD 08/18/2018, 7:55 AM  VAD Team --- VAD ISSUES ONLY--- Pager [phone] (7am - 7am)  Advanced Heart Failure Team  Pager [phone] (M-F; 7a - 4p)  Please  contact [facility] for night-coverage after hours (4p -7a ) and weekends on [email or web address]

## 2018-08-21 ENCOUNTER — Telehealth (HOSPITAL_COMMUNITY): Payer: Self-pay | Admitting: *Deleted

## 2018-08-21 ENCOUNTER — Encounter (HOSPITAL_COMMUNITY): Payer: Self-pay

## 2018-08-21 ENCOUNTER — Other Ambulatory Visit (HOSPITAL_COMMUNITY): Payer: Self-pay | Admitting: *Deleted

## 2018-08-21 ENCOUNTER — Ambulatory Visit (HOSPITAL_COMMUNITY): Payer: Self-pay | Admitting: Pharmacist

## 2018-08-21 DIAGNOSIS — Z5181 Encounter for therapeutic drug level monitoring: Secondary | ICD-10-CM

## 2018-08-21 DIAGNOSIS — Z95811 Presence of heart assist device: Secondary | ICD-10-CM

## 2018-08-21 DIAGNOSIS — I824Y9 Acute embolism and thrombosis of unspecified deep veins of unspecified proximal lower extremity: Secondary | ICD-10-CM

## 2018-08-21 LAB — POCT INR: INR: 2.5 (ref 2.0–3.0)

## 2018-08-21 NOTE — Telephone Encounter (Signed)
Spoke with [name] regarding Tramadol prescription. Prescription was picked up from the [facility] pharmacy in [place] on 12/14. Per patient and his daughter, grandson accidentally threw away prescription bottle. Dr. Aundra Dubin made aware. Order received to refill prescription x 1. Patient made aware that prescription has been called in to [facility]. Patient verbalized understanding.   [name] RN [place] Coordinator  Office: [phone]  24/7 Pager: [phone]

## 2018-08-22 ENCOUNTER — Emergency Department (HOSPITAL_COMMUNITY): Payer: BLUE CROSS/BLUE SHIELD

## 2018-08-22 ENCOUNTER — Encounter (HOSPITAL_COMMUNITY): Payer: Self-pay | Admitting: Emergency Medicine

## 2018-08-22 ENCOUNTER — Inpatient Hospital Stay (HOSPITAL_COMMUNITY): Payer: BLUE CROSS/BLUE SHIELD

## 2018-08-22 ENCOUNTER — Inpatient Hospital Stay (HOSPITAL_COMMUNITY)
Admission: EM | Admit: 2018-08-22 | Discharge: 2018-08-27 | DRG: 073 | Disposition: A | Discharge status: Home / Self Care | Payer: BLUE CROSS/BLUE SHIELD | Attending: Internal Medicine | Admitting: Internal Medicine

## 2018-08-22 ENCOUNTER — Other Ambulatory Visit: Payer: Self-pay

## 2018-08-22 DIAGNOSIS — Z823 Family history of stroke: Secondary | ICD-10-CM

## 2018-08-22 DIAGNOSIS — E1143 Type 2 diabetes mellitus with diabetic autonomic (poly)neuropathy: Secondary | ICD-10-CM | POA: Diagnosis present

## 2018-08-22 DIAGNOSIS — Z7982 Long term (current) use of aspirin: Secondary | ICD-10-CM

## 2018-08-22 DIAGNOSIS — R7402 Elevation of levels of lactic acid dehydrogenase (LDH): Secondary | ICD-10-CM | POA: Diagnosis present

## 2018-08-22 DIAGNOSIS — Z833 Family history of diabetes mellitus: Secondary | ICD-10-CM

## 2018-08-22 DIAGNOSIS — Z992 Dependence on renal dialysis: Secondary | ICD-10-CM | POA: Diagnosis not present

## 2018-08-22 DIAGNOSIS — G4710 Hypersomnia, unspecified: Secondary | ICD-10-CM | POA: Diagnosis present

## 2018-08-22 DIAGNOSIS — K219 Gastro-esophageal reflux disease without esophagitis: Secondary | ICD-10-CM | POA: Diagnosis present

## 2018-08-22 DIAGNOSIS — N186 End stage renal disease: Secondary | ICD-10-CM | POA: Diagnosis present

## 2018-08-22 DIAGNOSIS — T82898A Other specified complication of vascular prosthetic devices, implants and grafts, initial encounter: Secondary | ICD-10-CM | POA: Diagnosis present

## 2018-08-22 DIAGNOSIS — I4892 Unspecified atrial flutter: Secondary | ICD-10-CM | POA: Diagnosis present

## 2018-08-22 DIAGNOSIS — Z8673 Personal history of transient ischemic attack (TIA), and cerebral infarction without residual deficits: Secondary | ICD-10-CM

## 2018-08-22 DIAGNOSIS — I255 Ischemic cardiomyopathy: Secondary | ICD-10-CM | POA: Diagnosis present

## 2018-08-22 DIAGNOSIS — Z8249 Family history of ischemic heart disease and other diseases of the circulatory system: Secondary | ICD-10-CM

## 2018-08-22 DIAGNOSIS — D6851 Activated protein C resistance: Secondary | ICD-10-CM | POA: Diagnosis present

## 2018-08-22 DIAGNOSIS — D631 Anemia in chronic kidney disease: Secondary | ICD-10-CM | POA: Diagnosis present

## 2018-08-22 DIAGNOSIS — Z95811 Presence of heart assist device: Secondary | ICD-10-CM | POA: Diagnosis not present

## 2018-08-22 DIAGNOSIS — R740 Nonspecific elevation of levels of transaminase and lactic acid dehydrogenase [LDH]: Secondary | ICD-10-CM | POA: Diagnosis present

## 2018-08-22 DIAGNOSIS — E1122 Type 2 diabetes mellitus with diabetic chronic kidney disease: Secondary | ICD-10-CM | POA: Diagnosis present

## 2018-08-22 DIAGNOSIS — Z86010 Personal history of colonic polyps: Secondary | ICD-10-CM

## 2018-08-22 DIAGNOSIS — K3184 Gastroparesis: Secondary | ICD-10-CM | POA: Diagnosis not present

## 2018-08-22 DIAGNOSIS — Z888 Allergy status to other drugs, medicaments and biological substances status: Secondary | ICD-10-CM

## 2018-08-22 DIAGNOSIS — Z9861 Coronary angioplasty status: Secondary | ICD-10-CM

## 2018-08-22 DIAGNOSIS — I252 Old myocardial infarction: Secondary | ICD-10-CM

## 2018-08-22 DIAGNOSIS — I5043 Acute on chronic combined systolic (congestive) and diastolic (congestive) heart failure: Secondary | ICD-10-CM | POA: Diagnosis not present

## 2018-08-22 DIAGNOSIS — R791 Abnormal coagulation profile: Secondary | ICD-10-CM

## 2018-08-22 DIAGNOSIS — R4182 Altered mental status, unspecified: Secondary | ICD-10-CM | POA: Diagnosis not present

## 2018-08-22 DIAGNOSIS — Z7901 Long term (current) use of anticoagulants: Secondary | ICD-10-CM

## 2018-08-22 DIAGNOSIS — Z79899 Other long term (current) drug therapy: Secondary | ICD-10-CM

## 2018-08-22 DIAGNOSIS — E8889 Other specified metabolic disorders: Secondary | ICD-10-CM | POA: Diagnosis present

## 2018-08-22 DIAGNOSIS — I5022 Chronic systolic (congestive) heart failure: Secondary | ICD-10-CM | POA: Diagnosis not present

## 2018-08-22 DIAGNOSIS — E1142 Type 2 diabetes mellitus with diabetic polyneuropathy: Secondary | ICD-10-CM | POA: Diagnosis present

## 2018-08-22 DIAGNOSIS — R1110 Vomiting, unspecified: Secondary | ICD-10-CM | POA: Insufficient documentation

## 2018-08-22 DIAGNOSIS — Y838 Other surgical procedures as the cause of abnormal reaction of the patient, or of later complication, without mention of misadventure at the time of the procedure: Secondary | ICD-10-CM | POA: Diagnosis present

## 2018-08-22 DIAGNOSIS — Z794 Long term (current) use of insulin: Secondary | ICD-10-CM

## 2018-08-22 DIAGNOSIS — I132 Hypertensive heart and chronic kidney disease with heart failure and with stage 5 chronic kidney disease, or end stage renal disease: Secondary | ICD-10-CM | POA: Diagnosis present

## 2018-08-22 DIAGNOSIS — I2510 Atherosclerotic heart disease of native coronary artery without angina pectoris: Secondary | ICD-10-CM | POA: Diagnosis present

## 2018-08-22 DIAGNOSIS — R112 Nausea with vomiting, unspecified: Secondary | ICD-10-CM | POA: Diagnosis not present

## 2018-08-22 DIAGNOSIS — D638 Anemia in other chronic diseases classified elsewhere: Secondary | ICD-10-CM | POA: Diagnosis present

## 2018-08-22 DIAGNOSIS — K5090 Crohn's disease, unspecified, without complications: Secondary | ICD-10-CM | POA: Diagnosis present

## 2018-08-22 DIAGNOSIS — E785 Hyperlipidemia, unspecified: Secondary | ICD-10-CM | POA: Diagnosis present

## 2018-08-22 DIAGNOSIS — I10 Essential (primary) hypertension: Secondary | ICD-10-CM | POA: Diagnosis present

## 2018-08-22 DIAGNOSIS — I509 Heart failure, unspecified: Secondary | ICD-10-CM

## 2018-08-22 LAB — COMPREHENSIVE METABOLIC PANEL
ALBUMIN: 3.1 g/dL — AB (ref 3.5–5.0)
ALT: 19 U/L (ref 0–44)
AST: 45 U/L — ABNORMAL HIGH (ref 15–41)
Alkaline Phosphatase: 116 U/L (ref 38–126)
Anion gap: 15 (ref 5–15)
BILIRUBIN TOTAL: 1.1 mg/dL (ref 0.3–1.2)
BUN: 16 mg/dL (ref 6–20)
CO2: 22 mmol/L (ref 22–32)
Calcium: 9.4 mg/dL (ref 8.9–10.3)
Chloride: 97 mmol/L — ABNORMAL LOW (ref 98–111)
Creatinine, Ser: 4.2 mg/dL — ABNORMAL HIGH (ref 0.61–1.24)
GFR calc Af Amer: 17 mL/min — ABNORMAL LOW (ref >60)
GFR calc non Af Amer: 15 mL/min — ABNORMAL LOW (ref >60)
Glucose, Bld: 107 mg/dL — ABNORMAL HIGH (ref 70–99)
POTASSIUM: 4.4 mmol/L (ref 3.5–5.1)
Sodium: 134 mmol/L — ABNORMAL LOW (ref 135–145)
Total Protein: 7.6 g/dL (ref 6.5–8.1)

## 2018-08-22 LAB — CBC WITH DIFFERENTIAL/PLATELET
Abs Immature Granulocytes: 0.18 10*3/uL — ABNORMAL HIGH (ref 0.00–0.07)
BASOS ABS: 0 10*3/uL (ref 0.0–0.1)
Basophils Relative: 0 %
Eosinophils Absolute: 0.1 10*3/uL (ref 0.0–0.5)
Eosinophils Relative: 0 %
HCT: 31.1 % — ABNORMAL LOW (ref 39.0–52.0)
Hemoglobin: 9.7 g/dL — ABNORMAL LOW (ref 13.0–17.0)
Immature Granulocytes: 1 %
LYMPHS ABS: 1 10*3/uL (ref 0.7–4.0)
Lymphocytes Relative: 7 %
MCH: 28 pg (ref 26.0–34.0)
MCHC: 31.2 g/dL (ref 30.0–36.0)
MCV: 89.9 fL (ref 80.0–100.0)
Monocytes Absolute: 0.8 10*3/uL (ref 0.1–1.0)
Monocytes Relative: 5 %
NRBC: 0.2 % (ref 0.0–0.2)
Neutro Abs: 13.8 10*3/uL — ABNORMAL HIGH (ref 1.7–7.7)
Neutrophils Relative %: 87 %
Platelets: 315 10*3/uL (ref 150–400)
RBC: 3.46 MIL/uL — ABNORMAL LOW (ref 4.22–5.81)
RDW: 15 % (ref 11.5–15.5)
WBC: 15.9 10*3/uL — ABNORMAL HIGH (ref 4.0–10.5)

## 2018-08-22 LAB — HEPARIN LEVEL (UNFRACTIONATED): Heparin Unfractionated: 0.52 IU/mL (ref 0.30–0.70)

## 2018-08-22 LAB — PROTIME-INR
INR: 1.7
PROTHROMBIN TIME: 19.7 s — AB (ref 11.4–15.2)

## 2018-08-22 LAB — GLUCOSE, CAPILLARY
Glucose-Capillary: 126 mg/dL — ABNORMAL HIGH (ref 70–99)
Glucose-Capillary: 129 mg/dL — ABNORMAL HIGH (ref 70–99)

## 2018-08-22 LAB — LACTATE DEHYDROGENASE: LDH: 403 U/L — ABNORMAL HIGH (ref 98–192)

## 2018-08-22 MED ORDER — ONDANSETRON HCL 4 MG/2ML IJ SOLN
4.0000 mg | Freq: Four times a day (QID) | INTRAMUSCULAR | Status: DC | PRN
  Administered 2018-08-22 – 2018-08-24 (×4): 4 mg via INTRAVENOUS
  Filled 2018-08-22 (×3): qty 2

## 2018-08-22 MED ORDER — RENA-VITE PO TABS
1.0000 | ORAL_TABLET | Freq: Every day | ORAL | Status: DC
  Administered 2018-08-23 – 2018-08-26 (×3): 1 via ORAL
  Filled 2018-08-22 (×4): qty 1

## 2018-08-22 MED ORDER — ISOSORB DINITRATE-HYDRALAZINE 20-37.5 MG PO TABS
1.5000 | ORAL_TABLET | ORAL | Status: DC
  Administered 2018-08-23 – 2018-08-26 (×7): 1.5 via ORAL
  Filled 2018-08-22 (×6): qty 2

## 2018-08-22 MED ORDER — PROMETHAZINE HCL 25 MG PO TABS: 12.5000 mg | ORAL_TABLET | Freq: Four times a day (QID) | ORAL | Status: DC | PRN

## 2018-08-22 MED ORDER — LORAZEPAM 2 MG/ML IJ SOLN
1.0000 mg | Freq: Once | INTRAMUSCULAR | Status: AC
  Administered 2018-08-22: 1 mg via INTRAVENOUS
  Filled 2018-08-22: qty 1

## 2018-08-22 MED ORDER — INSULIN ASPART 100 UNIT/ML ~~LOC~~ SOLN
0.0000 [IU] | Freq: Three times a day (TID) | SUBCUTANEOUS | Status: DC
  Administered 2018-08-25: 2 [IU] via SUBCUTANEOUS
  Administered 2018-08-26 – 2018-08-27 (×2): 3 [IU] via SUBCUTANEOUS

## 2018-08-22 MED ORDER — DRONABINOL 2.5 MG PO CAPS
2.5000 mg | ORAL_CAPSULE | Freq: Two times a day (BID) | ORAL | Status: DC
  Administered 2018-08-22 – 2018-08-26 (×7): 2.5 mg via ORAL
  Filled 2018-08-22 (×7): qty 1

## 2018-08-22 MED ORDER — SODIUM CHLORIDE 0.9 % IV BOLUS
250.0000 mL | Freq: Once | INTRAVENOUS | Status: AC
  Administered 2018-08-22: 250 mL via INTRAVENOUS

## 2018-08-22 MED ORDER — ACETAMINOPHEN 325 MG PO TABS
650.0000 mg | ORAL_TABLET | ORAL | Status: DC | PRN
  Administered 2018-08-24: 650 mg via ORAL
  Filled 2018-08-22: qty 2

## 2018-08-22 MED ORDER — PROMETHAZINE HCL 25 MG/ML IJ SOLN
12.5000 mg | Freq: Once | INTRAMUSCULAR | Status: AC
  Administered 2018-08-22: 12.5 mg via INTRAVENOUS
  Filled 2018-08-22: qty 1

## 2018-08-22 MED ORDER — METOCLOPRAMIDE HCL 10 MG PO TABS
10.0000 mg | ORAL_TABLET | Freq: Three times a day (TID) | ORAL | Status: DC
  Administered 2018-08-22 – 2018-08-27 (×14): 10 mg via ORAL
  Filled 2018-08-22 (×14): qty 1

## 2018-08-22 MED ORDER — TRAMADOL HCL 50 MG PO TABS: 50.0000 mg | ORAL_TABLET | Freq: Four times a day (QID) | ORAL | Status: DC | PRN

## 2018-08-22 MED ORDER — AMIODARONE HCL 200 MG PO TABS
200.0000 mg | ORAL_TABLET | Freq: Every day | ORAL | Status: DC
  Administered 2018-08-23 – 2018-08-27 (×5): 200 mg via ORAL
  Filled 2018-08-22 (×5): qty 1

## 2018-08-22 MED ORDER — CALCITRIOL 0.25 MCG PO CAPS
0.5000 ug | ORAL_CAPSULE | ORAL | Status: DC
  Administered 2018-08-24 – 2018-08-27 (×2): 0.5 ug via ORAL
  Filled 2018-08-22 (×2): qty 2

## 2018-08-22 MED ORDER — CITALOPRAM HYDROBROMIDE 20 MG PO TABS
20.0000 mg | ORAL_TABLET | Freq: Every day | ORAL | Status: DC
  Administered 2018-08-23 – 2018-08-27 (×5): 20 mg via ORAL
  Filled 2018-08-22 (×6): qty 1

## 2018-08-22 MED ORDER — WARFARIN - PHARMACIST DOSING INPATIENT
Freq: Every day | Status: DC
  Administered 2018-08-23: 17:00:00
  Administered 2018-08-24: 1

## 2018-08-22 MED ORDER — DOCUSATE SODIUM 100 MG PO CAPS: 100.0000 mg | ORAL_CAPSULE | Freq: Every day | ORAL | Status: DC | PRN

## 2018-08-22 MED ORDER — INSULIN GLARGINE 100 UNIT/ML ~~LOC~~ SOLN
10.0000 [IU] | Freq: Every day | SUBCUTANEOUS | Status: DC
  Administered 2018-08-23 – 2018-08-27 (×5): 10 [IU] via SUBCUTANEOUS
  Filled 2018-08-22 (×5): qty 0.1

## 2018-08-22 MED ORDER — LORAZEPAM 0.5 MG PO TABS: 0.5000 mg | ORAL_TABLET | Freq: Two times a day (BID) | ORAL | Status: DC | PRN

## 2018-08-22 MED ORDER — HEPARIN (PORCINE) 25000 UT/250ML-% IV SOLN
1550.0000 [IU]/h | INTRAVENOUS | Status: DC
  Administered 2018-08-22 – 2018-08-24 (×3): 1550 [IU]/h via INTRAVENOUS
  Filled 2018-08-22 (×3): qty 250

## 2018-08-22 MED ORDER — INSULIN ASPART 100 UNIT/ML ~~LOC~~ SOLN: 0.0000 [IU] | Freq: Every day | SUBCUTANEOUS | Status: DC

## 2018-08-22 MED ORDER — LORAZEPAM 2 MG/ML IJ SOLN
0.5000 mg | Freq: Two times a day (BID) | INTRAMUSCULAR | Status: DC | PRN
  Administered 2018-08-22 – 2018-08-23 (×3): 0.5 mg via INTRAVENOUS
  Filled 2018-08-22 (×3): qty 1

## 2018-08-22 MED ORDER — SEVELAMER CARBONATE 800 MG PO TABS
4000.0000 mg | ORAL_TABLET | Freq: Three times a day (TID) | ORAL | Status: DC
  Filled 2018-08-22: qty 5

## 2018-08-22 MED ORDER — ERYTHROMYCIN BASE 250 MG PO TABS
250.0000 mg | ORAL_TABLET | Freq: Three times a day (TID) | ORAL | Status: DC
  Administered 2018-08-23 – 2018-08-27 (×12): 250 mg via ORAL
  Filled 2018-08-22 (×13): qty 1

## 2018-08-22 MED ORDER — PROMETHAZINE HCL 25 MG/ML IJ SOLN
12.5000 mg | Freq: Four times a day (QID) | INTRAMUSCULAR | Status: DC | PRN
  Administered 2018-08-22 – 2018-08-25 (×5): 12.5 mg via INTRAVENOUS
  Filled 2018-08-22 (×6): qty 1

## 2018-08-22 MED ORDER — HYDRALAZINE HCL 20 MG/ML IJ SOLN
10.0000 mg | Freq: Four times a day (QID) | INTRAMUSCULAR | Status: DC | PRN
  Administered 2018-08-22 – 2018-08-24 (×3): 10 mg via INTRAVENOUS
  Filled 2018-08-22 (×4): qty 1

## 2018-08-22 MED ORDER — LIDOCAINE-PRILOCAINE 2.5-2.5 % EX CREA: 1.0000 "application " | TOPICAL_CREAM | CUTANEOUS | Status: DC | PRN

## 2018-08-22 MED ORDER — ATORVASTATIN CALCIUM 40 MG PO TABS
40.0000 mg | ORAL_TABLET | Freq: Every day | ORAL | Status: DC
  Administered 2018-08-22 – 2018-08-26 (×5): 40 mg via ORAL
  Filled 2018-08-22 (×5): qty 1

## 2018-08-22 MED ORDER — WARFARIN SODIUM 7.5 MG PO TABS
7.5000 mg | ORAL_TABLET | Freq: Once | ORAL | Status: AC
  Administered 2018-08-22: 7.5 mg via ORAL
  Filled 2018-08-22: qty 1

## 2018-08-22 MED ORDER — ASPIRIN EC 81 MG PO TBEC
81.0000 mg | DELAYED_RELEASE_TABLET | Freq: Every day | ORAL | Status: DC
  Administered 2018-08-23 – 2018-08-27 (×5): 81 mg via ORAL
  Filled 2018-08-22 (×5): qty 1

## 2018-08-22 MED ORDER — METOCLOPRAMIDE HCL 5 MG/ML IJ SOLN
10.0000 mg | Freq: Once | INTRAMUSCULAR | Status: AC
  Administered 2018-08-22: 10 mg via INTRAVENOUS
  Filled 2018-08-22: qty 2

## 2018-08-22 MED ORDER — PANTOPRAZOLE SODIUM 40 MG PO TBEC
40.0000 mg | DELAYED_RELEASE_TABLET | Freq: Two times a day (BID) | ORAL | Status: DC
  Administered 2018-08-22 – 2018-08-27 (×9): 40 mg via ORAL
  Filled 2018-08-22 (×9): qty 1

## 2018-08-22 NOTE — Progress Notes (Signed)
LVAD Coordinator ED Encounter  LVAD is a HM III and was implanted on 03/01/18 by Dr. Prescott Gum.  Patient seen in ER after being transported via EMS from dialysis. Per HD RN, patient had been nauseated and vomited several times. He was able to tolerate all but the last 30 minutes of his treatment today. Upon assessment he is pale, and dry heaving.    Vital signs: HR:  Doppler MAP: not done Automated BP: 150/95 (115) O2 Sat: 100% RA  LVAD interrogation reveals:  Speed: 5500 Flow: 3.1 Power: 4.5w  PI: 9.2   Alarms: none Events: 18 PI events today    [name] PA and Dr. Aundra Dubin updated on patient status. Advised ER team to provide patient with PRN Phenergan and Ativan to alleviate nausea. Lab work is pending.  No LVAD issues and pump is functioning as expected. Able to independently manage LVAD equipment. No LVAD needs at this time.    [name] RN [place] Coordinator  Office: [phone]  24/7 Pager: [phone]

## 2018-08-22 NOTE — H&P (Signed)
Advanced Heart Failure VAD History and Physical Note   PCP-Cardiologist: No primary care provider on file.   Reason for Admission: Emesis, Elevated LDH, Subtherapeutic INR  HPI:    [name] is a 52 y.o. malewith history of CAD s/p anterior MI in 2010 and NSTEMI in 3/18 with DES to pRCA and mLAD, diabetic, gastroparesis, Chrons disease, DMIIand ischemic cardiomyopathy, 10/2018s/p Heartmate 3 LVAD(emergent exchange 02/2018 due to outflow graft kink)and ESRD(HD MWF). He has LUE AVR 05/2018.  Admitted 08/08/18 for peri-op management for 2nd stage of AVF. Coumadin held and heparin bridge started when INR <1.8. He underwent US-guided cannulation of LUE AV fistula and found to be sclerotic and not suitable for dialysis. New LUE graft created from brachial artery to axillary vein. RHC completed as part of heart and kidney transplant work up for [employer]. Course complicated by fevers, requiring IV abx for HCAP and anemia, requiring blood transfusion. Coumadin resumed and bridged with heparin until INR >1.8.  Unfortunately he developed spontaneous LUE hematoma. INR was supra therapeutic so coumadin was held for a few days. VVS followed closely. LUE hematoma gradually improved with reduced edema and pain. VSS recommended conservative with management so coumadin was resumed. Once his pain was controlled he was discharged to home.   He was brought to [facility] today from HD due to emesis. He woke up this am with Nausea, but was his USOH yesterday. He made it almost completely through dialysis prior to this episode started. Denies SOB, lightheadedness or dizziness. No CP. Left arm hematoma has continued to improve. He has been taking all medications as directed.  Pertinent labs include K 4.4, Cr 4.20 (HD), WBC 15.9 (from 12.4), Hgb 9.7, INR 1.70 and LDH up from 196 to 403.  LVAD Interrogation HM 3: Speed: 5600 Flow: 3.1 PI: 8.8 Power: 4.5. ~20 PI events today.  Review of systems complete and found  to be negative unless listed in HPI.    Home Medications Prior to Admission medications   Medication Sig Start Date End Date Taking? Authorizing Provider  amiodarone (PACERONE) 200 MG tablet Take 200 mg by mouth daily.    [provider]  aspirin EC 81 MG tablet Take 1 tablet (81 mg total) by mouth daily. 08/20/18   Eileen Stanford, PA-C  atorvastatin (LIPITOR) 40 MG tablet Take 1 tablet (40 mg total) by mouth daily at 6 PM. 01/25/18   [name], MD  calcitRIOL (ROCALTROL) 0.5 MCG capsule Take 1 capsule (0.5 mcg total) by mouth every Monday, Wednesday, and Friday. 06/06/18   Georgiana Shore, NP  citalopram (CELEXA) 20 MG tablet Take 1 tablet (20 mg total) by mouth daily. 03/19/18   Georgiana Shore, NP  docusate sodium (COLACE) 100 MG capsule Take 100 mg by mouth daily as needed for mild constipation.    [provider]  dronabinol (MARINOL) 2.5 MG capsule Take 1 capsule (2.5 mg total) by mouth 2 (two) times daily before lunch and supper. 03/27/18   Larey Dresser, MD  erythromycin (E-MYCIN) 250 MG tablet Take 1 tablet (250 mg total) by mouth 3 (three) times daily. 03/25/18   Isaiah Serge, NP  insulin aspart (NOVOLOG) 100 UNIT/ML injection Inject 5 Units into the skin 3 (three) times daily with meals. 01/22/18   Georgiana Shore, NP  insulin glargine (LANTUS) 100 UNIT/ML injection Inject 0.1 mLs (10 Units total) into the skin daily. 05/09/18   Lovenia Kim, MD  isosorbide-hydrALAZINE (BIDIL) 20-37.5 MG tablet Take 1.5  tablets by mouth every 8 (eight) hours. Take 1.5 tablets every 8 hours on non dialysis days. Patient taking differently: Take 1 tablet by mouth daily.  03/19/18   Georgiana Shore, NP  lidocaine-prilocaine (EMLA) cream Apply 1 application topically as needed (to numb port).    [provider]  LORazepam (ATIVAN) 0.5 MG tablet Take 1 tablet (0.5 mg total) by mouth every 12 (twelve) hours as needed (nausea). 03/27/18   Larey Dresser, MD    metoCLOPramide (REGLAN) 10 MG tablet Take 1 tablet (10 mg total) by mouth 3 (three) times daily. 01/22/18   Georgiana Shore, NP  multivitamin (RENA-VIT) TABS tablet Take 1 tablet by mouth daily.    [provider]  pantoprazole (PROTONIX) 40 MG tablet Take 40 mg by mouth 2 (two) times daily.     [provider]  promethazine (PHENERGAN) 12.5 MG tablet Take 1 tablet (12.5 mg total) by mouth every 6 (six) hours as needed for nausea or vomiting. 07/06/18   Larey Dresser, MD  sevelamer carbonate (RENVELA) 800 MG tablet Take 1,600-4,000 mg by mouth See admin instructions. 4065m three times daily with meals and 16044mtwice daily with snacks    [provider]  traMADol (ULTRAM) 50 MG tablet Take 1 tablet (50 mg total) by mouth every 6 (six) hours as needed for moderate pain. 08/18/18   ThEileen StanfordPA-C  warfarin (COUMADIN) 5 MG tablet Take 1 tablet (5 mg total) by mouth See admin instructions. Take 5 mg (1 tablet) daily 08/18/18   ThEileen StanfordPA-C    Past Medical History: Past Medical History:  Diagnosis Date  . Angina   . ASCVD (arteriosclerotic cardiovascular disease)    , Anterior infarction 2005, LAD diagonal bifurcation intervention 03/2004  . Automatic implantable cardiac defibrillator -St. Jude's       . Benign neoplasm of colon   . CHF (congestive heart failure) ([facility]  . Chronic systolic heart failure ([facility]  . Coronary artery disease     Widely patent previously placed stents in the left anterior   . Crohn's disease ([facility]  . 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 Dialysis patient ([facility]  . Dyspnea   . Gastroparesis   . GERD (gastroesophageal reflux disease)   . High cholesterol   . Hyperlipidemia   . Hypersomnolent    Previous diagnosis of narcolepsy  . Hypertension, essential   . Ischemic cardiomyopathy    Ejection fraction 15-20% catheterization 2010  . Type II or unspecified type diabetes mellitus without  mention of complication, not stated as uncontrolled   . Unspecified gastritis and gastroduodenitis without mention of hemorrhage     Past Surgical History: Past Surgical History:  Procedure Laterality Date  . A/V SHUNTOGRAM N/A 05/24/2018   Procedure: A/V SHUNTOGRAM;  Surgeon: ClMarty HeckMD;  [facility];  Service: Cardiovascular;  Laterality: N/A;  . APPENDECTOMY    . AV FISTULA PLACEMENT Right 06/26/2017   Procedure: ARTERIOVENOUS (AV) FISTULA CREATION VERSUS GRAFT RIGHT  ARM;  Surgeon: ChConrad BurlingtonMD;  [facility];  Service: Vascular;  Laterality: Right;  . AV FISTULA PLACEMENT Right 10/31/2017   Procedure: INSERTION OF ARTERIOVENOUS (AV) ARTEGRAFT,  RIGHT UPPER ARM;  Surgeon: ChConrad BurlingtonMD;  [place] Service: Vascular;  Laterality: Right;  . AV FISTULA PLACEMENT Left 05/29/2018   Procedure: ARTERIOVENOUS (AV) FISTULA CREATION  LEFT UPPER EXTREMITY;  Surgeon: Waynetta Sandy, MD;  [facility];  Service: Vascular;  Laterality: Left;  . AV FISTULA PLACEMENT Left 08/09/2018   Procedure: INSERTION OF ARTERIOVENOUS (AV) GORE-TEX GRAFT LEFT UPPER ARM;  Surgeon: Waynetta Sandy, MD;  Location: [place];  Service: Vascular;  Laterality: Left;  . CARDIAC DEFIBRILLATOR PLACEMENT  2010   St. Jude ICD  . CENTRAL LINE INSERTION Right 05/24/2018   Procedure: CENTRAL LINE INSERTION;  Surgeon: Marty Heck, MD;  Location: [place] CV LAB;  Service: Cardiovascular;  Laterality: Right;  . COLECTOMY  ~ 2003   "for Crohn's" ascending colon.    . CORONARY STENT INTERVENTION N/A 11/25/2016   Procedure: Coronary Stent Intervention;  Surgeon: Leonie Man, MD;  Location: [place] CV LAB;  Service: Cardiovascular;  Laterality: N/A;  . EP IMPLANTABLE DEVICE N/A 09/14/2016   Procedure: ICD Generator Changeout;  Surgeon: Deboraha Sprang, MD;  Location: [place] CV LAB;  Service: Cardiovascular;  Laterality: N/A;  .  ESOPHAGOGASTRODUODENOSCOPY N/A 07/12/2017   Procedure: ESOPHAGOGASTRODUODENOSCOPY (EGD);  Surgeon: Jerene Bears, MD;  Location: [facility] ENDOSCOPY;  Service: Gastroenterology;  Laterality: N/A;  VAD pt so VAD team will need to accompany pt.   [name] FETAL SURGERY FOR CONGENITAL HERNIA     ????? pt has no knowledge of this..    . FISTULOGRAM Left 08/09/2018   Procedure: FISTULOGRAM LEFT ARM;  Surgeon: Waynetta Sandy, MD;  Location: [place];  Service: Vascular;  Laterality: Left;  . INGUINAL HERNIA REPAIR    . INSERTION OF DIALYSIS CATHETER Right 06/26/2017   Procedure: INSERTION OF TUNNELED  DIALYSIS CATHETER RIGHT INTERNAL JUGULAR;  Surgeon: Conrad [place], MD;  Location: [place];  Service: Vascular;  Laterality: Right;  . INSERTION OF IMPLANTABLE LEFT VENTRICULAR ASSIST DEVICE N/A 06/12/2017   Procedure: INSERTION OF IMPLANTABLE LEFT VENTRICULAR ASSIST [place] 3;  Surgeon: Ivin Poot, MD;  Location: [place];  Service: Open Heart Surgery;  Laterality: N/A;  HM3 LVAD  CIRC ARREST  NITRIC OXIDE  . INSERTION OF IMPLANTABLE LEFT VENTRICULAR ASSIST DEVICE N/A 02/28/2018   Procedure: REDO INSERTION OF IMPLANTABLE LEFT VENTRICULAR ASSIST DEVICE - HEARTMATE 3;  Surgeon: Ivin Poot, MD;  Location: [place];  Service: Open Heart Surgery;  Laterality: N/A;  . IR REMOVAL TUN CV CATH W/O FL  02/26/2018  . IR THORACENTESIS ASP PLEURAL SPACE W/IMG GUIDE  06/28/2017  . LIGATION OF ARTERIOVENOUS  FISTULA Right 10/31/2017   Procedure: LIGATION OF BRACHIO-BASILIC VEIN TRANSPOSITION RIGHT ARM;  Surgeon: Conrad Quechee, MD;  Location: [place];  Service: Vascular;  Laterality: Right;  . MULTIPLE EXTRACTIONS WITH ALVEOLOPLASTY N/A 06/08/2017   Procedure: MULTIPLE EXTRACTION WITH ALVEOLOPLASTY AND PRE PROSTHETIC SURGERY AS NEEDED;  Surgeon: Lenn Cal, DDS;  Location: [place];  Service: Oral Surgery;  Laterality: N/A;  . RIGHT HEART CATH N/A 06/07/2017   Procedure: RIGHT HEART CATH;  Surgeon: Larey Dresser, MD;  Location: [place] CV LAB;  Service: Cardiovascular;  Laterality: N/A;  . RIGHT HEART CATH N/A 02/08/2018   Procedure: RIGHT HEART CATH;  Surgeon: Larey Dresser, MD;  Location: [place] CV LAB;  Service: Cardiovascular;  Laterality: N/A;  . RIGHT HEART CATH N/A 08/09/2018   Procedure: RIGHT HEART CATH;  Surgeon: Larey Dresser, MD;  Location: [place] CV LAB;  Service: Cardiovascular;  Laterality: N/A;  . RIGHT/LEFT HEART CATH AND CORONARY ANGIOGRAPHY N/A 11/23/2016   Procedure: Right/Left Heart Cath and Coronary Angiography;  Surgeon:  Larey Dresser, MD;  Location: [place] CV LAB;  Service: Cardiovascular;  Laterality: N/A;  . TEE WITHOUT CARDIOVERSION N/A 06/12/2017   Procedure: TRANSESOPHAGEAL ECHOCARDIOGRAM (TEE);  Surgeon: Prescott Gum, Collier Salina, MD;  Location: [place];  Service: Open Heart Surgery;  Laterality: N/A;  . TEE WITHOUT CARDIOVERSION N/A 02/28/2018   Procedure: TRANSESOPHAGEAL ECHOCARDIOGRAM (TEE);  Surgeon: Prescott Gum, Collier Salina, MD;  Location: [place];  Service: Open Heart Surgery;  Laterality: N/A;  . TEMPORARY DIALYSIS CATHETER Left 05/24/2018   Procedure: TEMPORARY DIALYSIS CATHETER;  Surgeon: Marty Heck, MD;  Location: [place] CV LAB;  Service: Cardiovascular;  Laterality: Left;  . THROMBECTOMY AND REVISION OF ARTERIOVENTOUS (AV) GORETEX  GRAFT Right 12/12/2017   Procedure: THROMBECTOMY ARTERIOVENTOUS (AV) GORETEX  GRAFT RIGHT UPPER ARM;  Surgeon: Conrad Harrison, MD;  Location: [facility] OR;  Service: Vascular;  Laterality: Right;    Family History: Family History  Problem Relation Age of Onset  . Colon polyps Mother   . Diabetes Mother   . Heart attack Father   . Diabetes Father   . Stroke Paternal Grandfather   . Colitis Sister   . Heart disease Brother   . Colitis Sister   . Colon cancer Neg Hx     Social History: Social History   Socioeconomic History  . Marital status: Married    Spouse name: Not on file  . Number of children: Not on file  .  Years of education: Not on file  . Highest education level: Not on file  Occupational History  . Not on file  Social Needs  . Financial resource strain: Not on file  . Food insecurity:    Worry: Not on file    Inability: Not on file  . Transportation needs:    Medical: Not on file    Non-medical: Not on file  Tobacco Use  . Smoking status: Never Smoker  . Smokeless tobacco: Never Used  Substance and Sexual Activity  . Alcohol use: No  . Drug use: No  . Sexual activity: Never  Lifestyle  . Physical activity:    Days per week: Not on file    Minutes per session: Not on file  . Stress: Not on file  Relationships  . Social connections:    Talks on phone: Not on file    Gets together: Not on file    Attends religious service: Not on file    Active member of club or organization: Not on file    Attends meetings of clubs or organizations: Not on file    Relationship status: Not on file  Other Topics Concern  . Not on file  Social History Narrative  . Not on file    Allergies:  Allergies  Allergen Reactions  . Metformin And Related Diarrhea    Objective:    Vital Signs:   Vitals:   08/22/18 1215 08/22/18 1245 08/22/18 1420 08/22/18 1445  BP: (!) 152/104 (!) 150/108 (!) 151/104 (!) 154/114  Pulse: 84 90 85 86  Resp: 16 (!) 27 (!) 22 14  Temp:      TempSrc:      SpO2: 100% 100% 99% 99%  Weight:      Height:          Filed Weights   08/22/18 1117  Weight: 81.6 kg    Mean arterial Pressure 110-120 in setting of N/V  Physical Exam    General:  Nauseated. Fatigued appearing.  HEENT: Normal Neck: supple. JVP does  not appear elevated. Carotids 2+ bilat; no bruits. No lymphadenopathy or thyromegaly appreciated. Cor: Mechanical heart sounds with LVAD hum present. Lungs: Clear Abdomen: soft, nontender, nondistended. No hepatosplenomegaly. No bruits or masses. Good bowel sounds. Driveline: C/D/I; securement device intact and driveline incorporated Extremities:  no cyanosis, clubbing, rash, edema. LUE hematoma improving. Less tender. Neuro: alert & orientedx3, cranial nerves grossly intact. moves all 4 extremities w/o difficulty. Affect pleasant   Telemetry   NSR 80-90s, personally reviewed.  EKG   No new tracings.    Labs    Basic Metabolic Panel: Recent Labs  Lab 08/16/18 0208 08/17/18 0310 08/18/18 0252  NA 132* 133* 139  K 3.6 3.7 4.4  CL 94* 92* 99  CO2 24 26 31   GLUCOSE 275* 183* 145*  BUN 23* 41* 16  CREATININE 4.87* 6.98* 3.70*  CALCIUM 8.2* 8.4* 8.4*    Liver Function Tests: No results for input(s): AST, ALT, ALKPHOS, BILITOT, PROT, ALBUMIN in the last 168 hours. No results for input(s): LIPASE, AMYLASE in the last 168 hours. No results for input(s): AMMONIA in the last 168 hours.  CBC: Recent Labs  Lab 08/16/18 0208 08/17/18 0310 08/18/18 0252  WBC 14.4* 15.3* 12.4*  HGB 8.1* 8.1* 8.6*  HCT 25.7* 25.8* 27.6*  MCV 90.2 89.9 89.9  PLT 271 275 289    Cardiac Enzymes: No results for input(s): CKTOTAL, CKMB, CKMBINDEX, TROPONINI in the last 168 hours.  BNP: BNP (last 3 results) Recent Labs    03/02/18 0400 03/08/18 0008 03/15/18 0106  BNP 694.1* 1,576.6* 811.3*    ProBNP (last 3 results) No results for input(s): PROBNP in the last 8760 hours.   CBG: Recent Labs  Lab 08/17/18 0742 08/17/18 1128 08/17/18 1828 08/17/18 2122 08/18/18 0802  GLUCAP 190* 151* 112* 282* 136*    Coagulation Studies: Recent Labs    08/21/18  INR 2.5   Imaging     No results found.   Patient Profile:   [name] is a 52 y.o. malewith history of CAD s/p anterior MI in 2010 and NSTEMI in 3/18 with DES to pRCA and mLAD, diabetic, gastroparesis, Chrons disease, DMIIand ischemic cardiomyopathy, 10/2018s/p Heartmate 3 LVAD(emergent exchange 02/2018 due to outflow graft kink)and ESRD(HD MWF). He has LUE AVR 05/2018.  Assessment/Plan:    1. Elevated LDH and Subtherapeutic INR - INR 1.7. Will use  heparin and follow closely.  - LDH 196 to > 400 in 4 days. May be related to recent hematoma, but will follow with sub INR.  2. Chronic systolic CHF: Ischemic cardiomyopathy. St Jude ICD. NSTEMI in March 2018 with DES to LAD and RCA, complicated by cardiogenic shock, low output requiring milrinone. Echo 8/18 with EF 20-25%, moderate MR. CPX (8/18) with severe functional impairment due to HF. He is s/p HM3 LVAD placement. He had pump thrombosis 6/19 due to [facility] outflow graft kinking. He had pump exchange for another HM3.RHC showed low filling pressures and good cardiac output 08/09/18   - Have discussed increasing HD dry weight with Dr. Posey Pronto, this seems to be helping (no low flows with HD this time in the hospital).   - Continue Bidil 1 tabTIDon non-HD days.  - INR 1.7. Goal INR 2-2.5. Will start heparin with elevated LDH.  - Continue ASA.  - He is now listed for heart/kidney at [facility]. [place] 08/09/18 with optimized filling pressures and cardiac output. Normal PA pressure.  3. ESRD: Developed peri-operatively after HM3 placement. He is tolerating HD, currently via catheter but  had LUE graft placed this admission.  Hematoma post-AV LUE graft placement.  VVS saw, suspects hematoma.There is question about graft patency.  - Continue tramadol for left arm pain.  -Coumadin was restarted 12/12. INR 1.7. Goal 2.0 - 2.5. Will start heparin.  - MWF HD - As above, discussed increasing dry weight with Dr. Posey Pronto based on [place] and low flows at dialysis prior to admission => allowing greater dry weight, he had no PI events/low flows with HD here.   4. CAD: NSTEMI in 3/18. LHC with 99% ulcerated lesion proximal RCA with left to right collaterals, 95% mid LAD stenosis after mid LAD stent. s/p PCI to RCA and LAD on 11/25/16.  - No s/s of ischemia.    - Continue statin. 5. h/o DVTs: Factor V Leiden heterozygote.  6. Diabetic gastroparesis: Difficult to manage. He has seen gastroparesis specialist at [facility].  Surprisingly, gastric emptying study was normal. However, electrogastrogram showed poor gastric accomodation/capacity. Therefore, small frequent meals recommended. Also morning nausea thought to be GERD and Protonix was increased. Nausea seems to drive markedly high BP (resolves with nausea control).  No issues.  Continue current regimen.   - Continue reglan 5 mg tid/ac, chronic erythromycin, and Marinol.  - Continue Protonix BID.   - Will use Phenergan prn. Works much better than Zofran.  - Make effort to eat small, more frequent meals.  - Will give 250 ml NS.  7. DM: HgbA1c well-controlled No change. SSI while in house.  8. HTN:  - Continue hydralazine on non HD days.  - Hypertension seems to be driven by nausea (see above), so will try to minimize nausea.  9. PAD: Moderate bilateral disease on ABIs in 8/18. Denies claudication currently, no pedal No change.  10. Atrial flutter: Paroxysmal.  - Remains in NSR.  - On coumadin for LVAD.     11. Anemia: Received 1PURBc 12/9.  - Hgb stable today.   With low INR and elevated LDH, will admit and follow.   I reviewed the LVAD parameters from today, and compared the results to the patient's prior recorded data.  No programming changes were made.  The LVAD is functioning within specified parameters.  The patient performs LVAD self-test daily.  LVAD interrogation was negative for any significant power changes, alarms or PI events/speed drops.  LVAD equipment check completed and is in good working order.  Back-up equipment present.   LVAD education done on emergency procedures and precautions and reviewed exit site care.  Length of Stay: 0  [name] 08/22/2018, 11:11 AM  VAD Team Pager [phone] (7am - 7am) +++VAD ISSUES ONLY+++   Advanced Heart Failure Team Pager [phone] (M-F; [place])  Please contact [facility] for night-coverage after hours (4p -7a ) and weekends on [email or web address] for all non- LVAD Issues  Patient  seen with PA, agree with the above note.   He woke up nauseated this morning, got worse at HD. Sent from HD to ER for severe nausea/vomiting.  Multiple PI events, no po intake today.   - Suspect this is his typical gastroparesis flare.  - Will give back 250 cc NS and treat symptomatically for nausea/vomiting.  He is on long-term erythromycin for gastroparesis.   Left arm AV graft surgical site with hematoma appears improved, less tight.   Hgb is higher than at admission.   LDH noted to be up to 403 with INR below goal at 1.7 today.  It is possible that LDH is  elevated due to extensive resolving hematoma in his left arm.  However, I am going to start him on IV heparin gtt until INR is back above 2. Will continue ASA.  Need to make sure LDH trends down.   [name] 08/22/2018 3:01 PM

## 2018-08-22 NOTE — Plan of Care (Signed)
  Problem: Health Behavior/Discharge Planning: Goal: Ability to manage health-related needs will improve Outcome: Progressing   Problem: Clinical Measurements: Goal: Ability to maintain clinical measurements within normal limits will improve Outcome: Progressing Goal: Will remain free from infection Outcome: Progressing Goal: Diagnostic test results will improve Outcome: Progressing Goal: Cardiovascular complication will be avoided Outcome: Progressing   Problem: Activity: Goal: Risk for activity intolerance will decrease Outcome: Progressing   Problem: Nutrition: Goal: Adequate nutrition will be maintained Outcome: Progressing   Problem: Coping: Goal: Level of anxiety will decrease Outcome: Progressing   Problem: Elimination: Goal: Will not experience complications related to bowel motility Outcome: Progressing   Problem: Pain Managment: Goal: General experience of comfort will improve Outcome: Progressing   Problem: Safety: Goal: Ability to remain free from injury will improve Outcome: Progressing   Problem: Cardiac: Goal: LVAD will function as expected and patient will experience no clinical alarms Outcome: Progressing

## 2018-08-22 NOTE — ED Notes (Signed)
Pt's RAC IV has been removed due to malposition. Two RN's attempted IV's but were only successful for 24G IV in pt's thumb and another 20G IV placed in RAC. 20G IV has been giving resistance so order for IV Team has been placed for ultrasound guided attempted.

## 2018-08-22 NOTE — ED Triage Notes (Signed)
Pt arrives to ED from dialysis with complaints of nausea and vomiting during his dialysis treatment. EMS reports pt vomited twice and finished half of his treatment. Pt is an LVAD pt. Pt placed in position of comfort with bed locked and lowered, call bell in reach.

## 2018-08-22 NOTE — Progress Notes (Signed)
[facility] for warfarin + heparin Indication: LVAD/Factor 5 Leiden  Allergies  Allergen Reactions  . Metformin And Related Diarrhea    Patient Measurements: Height: 5' 7"  (170.2 cm) Weight: 178 lb 9.2 oz (81 kg) IBW/kg (Calculated) : 66.1 Heparin Dosing Weight: 81kg  Vital Signs: Temp: 98.8 F (37.1 C) (12/18 2026) Temp Source: Oral (12/18 2026) BP: 134/87 (12/18 2000) Pulse Rate: 85 (12/18 1531)  Labs: Recent Labs    08/21/18 08/22/18 1122 08/22/18 2148  HGB  --  9.7*  --   HCT  --  31.1*  --   PLT  --  315  --   LABPROT  --  19.7*  --   INR 2.5 1.70  --   HEPARINUNFRC  --   --  0.52  CREATININE  --  4.20*  --     Estimated Creatinine Clearance: 20.7 mL/min (A) (by C-G formula based on SCr of 4.2 mg/dL (H)).   Medical History: Past Medical History:  Diagnosis Date  . Angina   . ASCVD (arteriosclerotic cardiovascular disease)    , Anterior infarction 2005, LAD diagonal bifurcation intervention 03/2004  . Automatic implantable cardiac defibrillator -St. Jude's       . Benign neoplasm of colon   . CHF (congestive heart failure) ([place])   . Chronic systolic heart failure ([place])   . Coronary artery disease     Widely patent previously placed stents in the left anterior   . Crohn's disease ([place])   . 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 Dialysis patient ([place])   . Dyspnea   . Gastroparesis   . GERD (gastroesophageal reflux disease)   . High cholesterol   . Hyperlipidemia   . Hypersomnolent    Previous diagnosis of narcolepsy  . Hypertension, essential   . Ischemic cardiomyopathy    Ejection fraction 15-20% catheterization 2010  . Type II or unspecified type diabetes mellitus without mention of complication, not stated as uncontrolled   . Unspecified gastritis and gastroduodenitis without mention of hemorrhage     Assessment: 3 yoM on warfarin PTA for LVAD. Pt admitted for nausea/vomiting during HD.  INR on admission 1.70 despite being 2.5 in clinic yesterday and LDH is up. Per MD, will continue warfarin and start IV heparin bridge given elevated LDH. Pt noted to be therapeutic on 1550 units/hr last admission - will start here.  *PTA Warfarin dose = 85m daily except 7.529mMonday  Initial heparin level at goal at 0.5 tonight. No issues noted. Recheck with am labs.   Goal of Therapy:  INR 2-3 Heparin level 0.3-0.7 units/ml Monitor platelets by anticoagulation protocol: Yes   Plan:  -Continue IV heparin at 1550 units/hr -Daily PT/INR, heparin level, CBC, LDH  FrErin HearingharmD., BCPS Clinical Pharmacist 08/22/2018 10:13 PM

## 2018-08-22 NOTE — ED Provider Notes (Addendum)
[facility] EMERGENCY DEPARTMENT Provider Note   CSN: [id number] Arrival date & time: 08/22/18  1105     History   Chief Complaint Chief Complaint  Patient presents with  . LVAD  . Emesis    HPI [name] is a 52 y.o. male.  HPI Patient is a 52 year old male presents to the emergency department with complaints of nausea and vomiting while at dialysis today.  Patient has a history of LVAD.  He had finished all of his dialysis except for the last 30 minutes when he developed sudden onset nausea and nonbloody nonbilious vomit.  Denies abdominal pain.  No diarrhea.  States he just does not feel good.  Denies chest pain.  No shortness of breath.  Patient brought to the emergency department via EMS.  Symptoms are moderate in severity   Past Medical History:  Diagnosis Date  . Angina   . ASCVD (arteriosclerotic cardiovascular disease)    , Anterior infarction 2005, LAD diagonal bifurcation intervention 03/2004  . Automatic implantable cardiac defibrillator -St. Jude's       . Benign neoplasm of colon   . CHF (congestive heart failure) ([place])   . Chronic systolic heart failure ([place])   . Coronary artery disease     Widely patent previously placed stents in the left anterior   . Crohn's disease ([place])   . 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 Dialysis patient ([place])   . Dyspnea   . Gastroparesis   . GERD (gastroesophageal reflux disease)   . High cholesterol   . Hyperlipidemia   . Hypersomnolent    Previous diagnosis of narcolepsy  . Hypertension, essential   . Ischemic cardiomyopathy    Ejection fraction 15-20% catheterization 2010  . Type II or unspecified type diabetes mellitus without mention of complication, not stated as uncontrolled   . Unspecified gastritis and gastroduodenitis without mention of hemorrhage     Patient Active Problem List   Diagnosis Date Noted  . Nausea & vomiting 06/11/2018  . Heart failure ([place])  05/23/2018  . Hypertensive urgency 03/25/2018  . Benign essential HTN   . Crohn's disease with complication ([place])   . Diabetes mellitus type 2 in nonobese (HCC)   . Anemia of chronic disease   . Sinus tachycardia   . Leukocytosis   . Left ventricular assist device (LVAD) complication 83/41/9622  . Left ventricular assist device (LVAD) complication, initial encounter 02/28/2018  . Factor V Leiden carrier ([place]) 01/10/2018  . History of creation of ostomy ([place]) 01/10/2018  . Hypercoagulable state ([place]) 01/10/2018  . ESRD (end stage renal disease) ([place]) 12/11/2017  . Left ventricular assist device present ([place]) 12/05/2017  . ESRD (end stage renal disease) on dialysis ([place]) 10/29/2017  . Nausea with vomiting 09/12/2017  . Intractable nausea and vomiting 07/31/2017  . 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) ([place]) 07/10/2017  . LVAD (left ventricular assist device) present ([place])   . Palliative care by specialist   . Chronic systolic CHF (congestive heart failure) ([place]) 06/07/2017  . ACP (advance care planning)   . Encounter for central line placement   . Diarrhea   . Palliative care encounter   . Nausea   . CHF exacerbation ([place]) 04/05/2017  . Diabetic keto-acidosis ([place]) 04/05/2017  . Type 2 diabetes mellitus with complication, with long-term current use of insulin ([place]) 03/08/2017  . ESRD on dialysis ([place]) 01/17/2017  . AKI (acute kidney injury) ([place])   . Non-ST  elevation (NSTEMI) myocardial infarction ([place])   . CHF (congestive heart failure) ([place]) 09/02/2016  . Elevated serum creatinine   . Encounter for therapeutic drug monitoring 08/03/2016  . Chest pain 04/26/2012  . SVT (supraventricular tachycardia) ([place]) 04/26/2012  . Gastroparesis 11/18/2011  . Nausea and vomiting in adult 11/17/2011  . Coronary artery disease   . Ischemic cardiomyopathy   . Essential hypertension   . Automatic implantable cardioverter-defibrillator in situ   . Deep venous thrombosis ([place])     . Hypersomnolent   . POLYP, COLON 09/25/2008  . Type 2 diabetes mellitus ([place]) 09/25/2008  . Dyslipidemia, goal LDL below 70 09/25/2008  . GASTRITIS 09/25/2008  . DIVERTICULOSIS, COLON 09/25/2008  . RECTAL MASS 09/25/2008  . History of Crohn's disease 09/05/2006    Past Surgical History:  Procedure Laterality Date  . A/V SHUNTOGRAM N/A 05/24/2018   Procedure: A/V SHUNTOGRAM;  Surgeon: Marty Heck, MD;  Location: [facility];  Service: Cardiovascular;  Laterality: N/A;  . APPENDECTOMY    . AV FISTULA PLACEMENT Right 06/26/2017   Procedure: ARTERIOVENOUS (AV) FISTULA CREATION VERSUS GRAFT RIGHT  ARM;  Surgeon: Conrad [place], MD;  Location: [facility];  Service: Vascular;  Laterality: Right;  . AV FISTULA PLACEMENT Right 10/31/2017   Procedure: INSERTION OF ARTERIOVENOUS (AV) ARTEGRAFT,  RIGHT UPPER ARM;  Surgeon: Conrad Zalma, MD;  Location: [facility];  Service: Vascular;  Laterality: Right;  . AV FISTULA PLACEMENT Left 05/29/2018   Procedure: ARTERIOVENOUS (AV) FISTULA CREATION  LEFT UPPER EXTREMITY;  Surgeon: Waynetta Sandy, MD;  Location: [facility];  Service: Vascular;  Laterality: Left;  . AV FISTULA PLACEMENT Left 08/09/2018   Procedure: INSERTION OF ARTERIOVENOUS (AV) GORE-TEX GRAFT LEFT UPPER ARM;  Surgeon: Waynetta Sandy, MD;  Location: [facility];  Service: Vascular;  Laterality: Left;  . CARDIAC DEFIBRILLATOR PLACEMENT  2010   St. Jude ICD  . CENTRAL LINE INSERTION Right 05/24/2018   Procedure: CENTRAL LINE INSERTION;  Surgeon: Marty Heck, MD;  Location: [facility];  Service: Cardiovascular;  Laterality: Right;  . COLECTOMY  ~ 2003   "for Crohn's" ascending colon.    . CORONARY STENT INTERVENTION N/A 11/25/2016   Procedure: Coronary Stent Intervention;  Surgeon: Leonie Man, MD;  Location: [facility];  Service: Cardiovascular;  Laterality: N/A;  . EP IMPLANTABLE DEVICE N/A 09/14/2016   Procedure: ICD Generator Changeout;  Surgeon:  Deboraha Sprang, MD;  Location: [facility];  Service: Cardiovascular;  Laterality: N/A;  . ESOPHAGOGASTRODUODENOSCOPY N/A 07/12/2017   Procedure: ESOPHAGOGASTRODUODENOSCOPY (EGD);  Surgeon: Jerene Bears, MD;  Location: [facility];  Service: Gastroenterology;  Laterality: N/A;  VAD pt so VAD team will need to accompany pt.   [name] FETAL SURGERY FOR CONGENITAL HERNIA     ????? pt has no knowledge of this..    . FISTULOGRAM Left 08/09/2018   Procedure: FISTULOGRAM LEFT ARM;  Surgeon: Waynetta Sandy, MD;  Location: [facility];  Service: Vascular;  Laterality: Left;  . INGUINAL HERNIA REPAIR    . INSERTION OF DIALYSIS CATHETER Right 06/26/2017   Procedure: INSERTION OF TUNNELED  DIALYSIS CATHETER RIGHT INTERNAL JUGULAR;  Surgeon: Conrad , MD;  Location: [facility];  Service: Vascular;  Laterality: Right;  . INSERTION OF IMPLANTABLE LEFT VENTRICULAR ASSIST DEVICE N/A 06/12/2017   Procedure: INSERTION OF IMPLANTABLE LEFT VENTRICULAR ASSIST [place] 3;  Surgeon: Ivin Poot, MD;  Location: [facility];  Service: Open Heart Surgery;  Laterality: N/A;  HM3 LVAD  CIRC ARREST  NITRIC OXIDE  . INSERTION OF IMPLANTABLE LEFT VENTRICULAR ASSIST DEVICE N/A 02/28/2018   Procedure: REDO INSERTION OF IMPLANTABLE LEFT VENTRICULAR ASSIST DEVICE - HEARTMATE 3;  Surgeon: Ivin Poot, MD;  Location: [facility];  Service: Open Heart Surgery;  Laterality: N/A;  . IR REMOVAL TUN CV CATH W/O FL  02/26/2018  . IR THORACENTESIS ASP PLEURAL SPACE W/IMG GUIDE  06/28/2017  . LIGATION OF ARTERIOVENOUS  FISTULA Right 10/31/2017   Procedure: LIGATION OF BRACHIO-BASILIC VEIN TRANSPOSITION RIGHT ARM;  Surgeon: Conrad [place], MD;  Location: [facility];  Service: Vascular;  Laterality: Right;  . MULTIPLE EXTRACTIONS WITH ALVEOLOPLASTY N/A 06/08/2017   Procedure: MULTIPLE EXTRACTION WITH ALVEOLOPLASTY AND PRE PROSTHETIC SURGERY AS NEEDED;  Surgeon: Lenn Cal, DDS;  Location: [facility];  Service: Oral Surgery;  Laterality:  N/A;  . RIGHT HEART CATH N/A 06/07/2017   Procedure: RIGHT HEART CATH;  Surgeon: Larey Dresser, MD;  Location: [facility];  Service: Cardiovascular;  Laterality: N/A;  . RIGHT HEART CATH N/A 02/08/2018   Procedure: RIGHT HEART CATH;  Surgeon: Larey Dresser, MD;  Location: [facility];  Service: Cardiovascular;  Laterality: N/A;  . RIGHT HEART CATH N/A 08/09/2018   Procedure: RIGHT HEART CATH;  Surgeon: Larey Dresser, MD;  Location: [facility];  Service: Cardiovascular;  Laterality: N/A;  . RIGHT/LEFT HEART CATH AND CORONARY ANGIOGRAPHY N/A 11/23/2016   Procedure: Right/Left Heart Cath and Coronary Angiography;  Surgeon: Larey Dresser, MD;  Location: [facility];  Service: Cardiovascular;  Laterality: N/A;  . TEE WITHOUT CARDIOVERSION N/A 06/12/2017   Procedure: TRANSESOPHAGEAL ECHOCARDIOGRAM (TEE);  Surgeon: Prescott Gum, Collier Salina, MD;  Location: [facility];  Service: Open Heart Surgery;  Laterality: N/A;  . TEE WITHOUT CARDIOVERSION N/A 02/28/2018   Procedure: TRANSESOPHAGEAL ECHOCARDIOGRAM (TEE);  Surgeon: Prescott Gum, Collier Salina, MD;  Location: [facility];  Service: Open Heart Surgery;  Laterality: N/A;  . TEMPORARY DIALYSIS CATHETER Left 05/24/2018   Procedure: TEMPORARY DIALYSIS CATHETER;  Surgeon: Marty Heck, MD;  Location: [facility];  Service: Cardiovascular;  Laterality: Left;  . THROMBECTOMY AND REVISION OF ARTERIOVENTOUS (AV) GORETEX  GRAFT Right 12/12/2017   Procedure: THROMBECTOMY ARTERIOVENTOUS (AV) GORETEX  GRAFT RIGHT UPPER ARM;  Surgeon: Conrad [place], MD;  Location: [facility];  Service: Vascular;  Laterality: Right;        Home Medications    Prior to Admission medications   Medication Sig Start Date End Date Taking? Authorizing Provider  amiodarone (PACERONE) 200 MG tablet Take 200 mg by mouth daily.   Yes [provider]  aspirin EC 81 MG tablet Take 1 tablet (81 mg total) by mouth daily. 08/20/18  Yes Eileen Stanford, PA-C    atorvastatin (LIPITOR) 40 MG tablet Take 1 tablet (40 mg total) by mouth daily at 6 PM. 01/25/18  Yes [name], MD  citalopram (CELEXA) 20 MG tablet Take 1 tablet (20 mg total) by mouth daily. 03/19/18  Yes Georgiana Shore, NP  docusate sodium (COLACE) 100 MG capsule Take 100 mg by mouth daily as needed for mild constipation.   Yes [provider]  dronabinol (MARINOL) 2.5 MG capsule Take 1 capsule (2.5 mg total) by mouth 2 (two) times daily before lunch and supper. 03/27/18  Yes Larey Dresser, MD  erythromycin (E-MYCIN) 250 MG tablet Take 1 tablet (250 mg total) by mouth 3 (three) times  daily. 03/25/18  Yes Isaiah Serge, NP  insulin aspart (NOVOLOG) 100 UNIT/ML injection Inject 5 Units into the skin 3 (three) times daily with meals. 01/22/18  Yes Georgiana Shore, NP  insulin glargine (LANTUS) 100 UNIT/ML injection Inject 0.1 mLs (10 Units total) into the skin daily. 05/09/18  Yes Lovenia Kim, MD  isosorbide-hydrALAZINE (BIDIL) 20-37.5 MG tablet Take 1.5 tablets by mouth every 8 (eight) hours. Take 1.5 tablets every 8 hours on non dialysis days. Patient taking differently: Take 1 tablet by mouth daily.  03/19/18  Yes Georgiana Shore, NP  lidocaine-prilocaine (EMLA) cream Apply 1 application topically as needed (to numb port).   Yes [provider]  LORazepam (ATIVAN) 0.5 MG tablet Take 1 tablet (0.5 mg total) by mouth every 12 (twelve) hours as needed (nausea). 03/27/18  Yes Larey Dresser, MD  metoCLOPramide (REGLAN) 10 MG tablet Take 1 tablet (10 mg total) by mouth 3 (three) times daily. 01/22/18  Yes Georgiana Shore, NP  multivitamin (RENA-VIT) TABS tablet Take 1 tablet by mouth daily.   Yes [provider]  pantoprazole (PROTONIX) 40 MG tablet Take 40 mg by mouth 2 (two) times daily.    Yes [provider]  promethazine (PHENERGAN) 12.5 MG tablet Take 1 tablet (12.5 mg total) by mouth every 6 (six) hours as needed for nausea or vomiting. 07/06/18   Yes Larey Dresser, MD  sevelamer carbonate (RENVELA) 800 MG tablet Take 1,600-4,000 mg by mouth See admin instructions. 4037m three times daily with meals and 16078mtwice daily with snacks   Yes [provider]  traMADol (ULTRAM) 50 MG tablet Take 1 tablet (50 mg total) by mouth every 6 (six) hours as needed for moderate pain. 08/18/18  Yes ThEileen StanfordPA-C  warfarin (COUMADIN) 5 MG tablet Take 1 tablet (5 mg total) by mouth See admin instructions. Take 5 mg (1 tablet) daily 08/18/18  Yes ThEileen StanfordPA-C  calcitRIOL (ROCALTROL) 0.5 MCG capsule Take 1 capsule (0.5 mcg total) by mouth every Monday, Wednesday, and Friday. 06/06/18   SmGeorgiana ShoreNP    Family History Family History  Problem Relation Age of Onset  . Colon polyps Mother   . Diabetes Mother   . Heart attack Father   . Diabetes Father   . Stroke Paternal Grandfather   . Colitis Sister   . Heart disease Brother   . Colitis Sister   . Colon cancer Neg Hx     Social History Social History   Tobacco Use  . Smoking status: Never Smoker  . Smokeless tobacco: Never Used  Substance Use Topics  . Alcohol use: No  . Drug use: No     Allergies   Metformin and related   Review of Systems Review of Systems  All other systems reviewed and are negative.    Physical Exam Updated Vital Signs BP (!) 150/108   Pulse 90   Temp 98.5 F (36.9 C) (Oral)   Resp (!) 27   Ht 5' 7"  (1.702 m)   Wt 81.6 kg   SpO2 100%   BMI 28.19 kg/m   Physical Exam Vitals signs and nursing note reviewed.  Constitutional:      Appearance: He is well-developed.  HENT:     Head: Normocephalic and atraumatic.  Neck:     Musculoskeletal: Normal range of motion.  Cardiovascular:     Rate and Rhythm: Normal rate and regular rhythm.     Heart sounds: Normal  heart sounds.  Pulmonary:     Effort: Pulmonary effort is normal. No respiratory distress.     Breath sounds: Normal breath sounds.  Abdominal:      General: There is no distension.     Palpations: Abdomen is soft.     Tenderness: There is no abdominal tenderness.  Musculoskeletal: Normal range of motion.  Skin:    General: Skin is warm and dry.  Neurological:     Mental Status: He is alert and oriented to person, place, and time.  Psychiatric:        Judgment: Judgment normal.      ED Treatments / Results  Labs (all labs ordered are listed, but only abnormal results are displayed) Labs Reviewed  CBC WITH DIFFERENTIAL/PLATELET - Abnormal; Notable for the following components:      Result Value   WBC 15.9 (*)    RBC 3.46 (*)    Hemoglobin 9.7 (*)    HCT 31.1 (*)    Neutro Abs 13.8 (*)    Abs Immature Granulocytes 0.18 (*)    All other components within normal limits  COMPREHENSIVE METABOLIC PANEL - Abnormal; Notable for the following components:   Sodium 134 (*)    Chloride 97 (*)    Glucose, Bld 107 (*)    Creatinine, Ser 4.20 (*)    Albumin 3.1 (*)    AST 45 (*)    GFR calc non Af Amer 15 (*)    GFR calc Af Amer 17 (*)    All other components within normal limits  PROTIME-INR - Abnormal; Notable for the following components:   Prothrombin Time 19.7 (*)    All other components within normal limits  LACTATE DEHYDROGENASE - Abnormal; Notable for the following components:   LDH 403 (*)    All other components within normal limits  HEPARIN LEVEL (UNFRACTIONATED)    EKG None  Radiology Dg Chest Portable 1 View  Result Date: 08/22/2018 CLINICAL DATA:  Nausea and vomiting EXAM: PORTABLE CHEST 1 VIEW COMPARISON:  08/10/2018 FINDINGS: Cardiac shadow remains enlarged. LVAD and defibrillator are again noted and stable. Left-sided dialysis catheter is noted in satisfactory position. The lungs are clear bilaterally. Elevation of the right hemidiaphragm is again seen. No other focal abnormality is noted. IMPRESSION: Support apparatus as described. No acute abnormality noted. Electronically Signed   By: Inez Catalina M.D.    On: 08/22/2018 11:37    Procedures .Critical Care Performed by: Jola Schmidt, MD Authorized by: Jola Schmidt, MD    CRITICAL CARE Performed by: Jola Schmidt Total critical care time: 32 minutes Critical care time was exclusive of separately billable procedures and treating other patients. Critical care was necessary to treat or prevent imminent or life-threatening deterioration. Critical care was time spent personally by me on the following activities: development of treatment plan with patient and/or surrogate as well as nursing, discussions with consultants, evaluation of patient's response to treatment, examination of patient, obtaining history from patient or surrogate, ordering and performing treatments and interventions, ordering and review of laboratory studies, ordering and review of radiographic studies, pulse oximetry and re-evaluation of patient's condition.   Medications Ordered in ED Medications  metoCLOPramide (REGLAN) injection 10 mg (has no administration in time range)  LORazepam (ATIVAN) injection 1 mg (1 mg Intravenous Given 08/22/18 1134)  promethazine (PHENERGAN) injection 12.5 mg (12.5 mg Intravenous Given 08/22/18 1134)     Initial Impression / Assessment and Plan / ED Course  I have reviewed the triage vital signs  and the nursing notes.  Pertinent labs & imaging results that were available during my care of the patient were reviewed by me and considered in my medical decision making (see chart for details).     Subtherapeutic INR.  Elevated LDH.  Patient will be initiated on heparin and admitted to the heart failure service given his nausea vomiting and rising LDH.  Repeat abdominal exam without focal tenderness.  I do not think he needs advanced imaging of his abdomen at this time.  Final Clinical Impressions(s) / ED Diagnoses   Final diagnoses:  None    ED Discharge Orders    None       Jola Schmidt, MD 08/22/18 [place], Evadne Ose,  MD 08/22/18 1352

## 2018-08-22 NOTE — ED Notes (Signed)
Rapid Response taking pt upstairs.

## 2018-08-22 NOTE — Progress Notes (Signed)
ANTICOAGULATION CONSULT NOTE - Initial Consult  Pharmacy Consult for warfarin + heparin Indication: LVAD/Factor 5 Leiden  Allergies  Allergen Reactions  . Metformin And Related Diarrhea    Patient Measurements: Height: 5' 7"  (170.2 cm) Weight: 180 lb (81.6 kg) IBW/kg (Calculated) : 66.1 Heparin Dosing Weight: 81kg  Vital Signs: Temp: 98.5 F (36.9 C) (12/18 1117) Temp Source: Oral (12/18 1117) BP: 152/104 (12/18 1215) Pulse Rate: 84 (12/18 1215)  Labs: Recent Labs    08/21/18 08/22/18 1122  HGB  --  9.7*  HCT  --  31.1*  PLT  --  315  LABPROT  --  19.7*  INR 2.5 1.70  CREATININE  --  4.20*    Estimated Creatinine Clearance: 20.8 mL/min (A) (by C-G formula based on SCr of 4.2 mg/dL (H)).   Medical History: Past Medical History:  Diagnosis Date  . Angina   . ASCVD (arteriosclerotic cardiovascular disease)    , Anterior infarction 2005, LAD diagonal bifurcation intervention 03/2004  . Automatic implantable cardiac defibrillator -St. Jude's       . Benign neoplasm of colon   . CHF (congestive heart failure) ([place])   . Chronic systolic heart failure ([place])   . Coronary artery disease     Widely patent previously placed stents in the left anterior   . Crohn's disease ([place])   . 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 Dialysis patient ([facility])   . Dyspnea   . Gastroparesis   . GERD (gastroesophageal reflux disease)   . High cholesterol   . Hyperlipidemia   . Hypersomnolent    Previous diagnosis of narcolepsy  . Hypertension, essential   . Ischemic cardiomyopathy    Ejection fraction 15-20% catheterization 2010  . Type II or unspecified type diabetes mellitus without mention of complication, not stated as uncontrolled   . Unspecified gastritis and gastroduodenitis without mention of hemorrhage     Assessment: 89 yoM on warfarin PTA for LVAD. Pt admitted for nausea/vomiting during HD. INR on admission 1.70 despite being 2.5 in clinic yesterday  and LDH is up. Per MD, will continue warfarin and start IV heparin bridge given elevated LDH. Pt noted to be therapeutic on 1550 units/hr last admission - will start here.  *PTA Warfarin dose = 92m daily except 7.574mMonday  Goal of Therapy:  INR 2-3 Heparin level 0.3-0.7 units/ml Monitor platelets by anticoagulation protocol: Yes   Plan:  -Start IV heparin 1550 units/hr -Check 8hr heparin level -Warfarin 7.10m22mO x1 tonight -Daily PT/INR, heparin level, CBC, LDH  MicArrie SenateharmD, BCPS Clinical Pharmacist [phone] check AMION for [facility] 08/22/2018

## 2018-08-23 LAB — LACTATE DEHYDROGENASE: LDH: 368 U/L — ABNORMAL HIGH (ref 98–192)

## 2018-08-23 LAB — BASIC METABOLIC PANEL
Anion gap: 12 (ref 5–15)
BUN: 27 mg/dL — ABNORMAL HIGH (ref 6–20)
CO2: 24 mmol/L (ref 22–32)
Calcium: 9 mg/dL (ref 8.9–10.3)
Chloride: 97 mmol/L — ABNORMAL LOW (ref 98–111)
Creatinine, Ser: 6.01 mg/dL — ABNORMAL HIGH (ref 0.61–1.24)
GFR calc Af Amer: 11 mL/min — ABNORMAL LOW (ref >60)
GFR calc non Af Amer: 10 mL/min — ABNORMAL LOW (ref >60)
Glucose, Bld: 123 mg/dL — ABNORMAL HIGH (ref 70–99)
POTASSIUM: 4.9 mmol/L (ref 3.5–5.1)
Sodium: 133 mmol/L — ABNORMAL LOW (ref 135–145)

## 2018-08-23 LAB — HEPARIN LEVEL (UNFRACTIONATED)
Heparin Unfractionated: 0.24 IU/mL — ABNORMAL LOW (ref 0.30–0.70)
Heparin Unfractionated: 0.61 IU/mL (ref 0.30–0.70)

## 2018-08-23 LAB — GLUCOSE, CAPILLARY
GLUCOSE-CAPILLARY: 88 mg/dL (ref 70–99)
Glucose-Capillary: 110 mg/dL — ABNORMAL HIGH (ref 70–99)
Glucose-Capillary: 124 mg/dL — ABNORMAL HIGH (ref 70–99)
Glucose-Capillary: 79 mg/dL (ref 70–99)
Glucose-Capillary: 92 mg/dL (ref 70–99)

## 2018-08-23 LAB — PROTIME-INR
INR: 1.9
PROTHROMBIN TIME: 21.5 s — AB (ref 11.4–15.2)

## 2018-08-23 LAB — CBC
HCT: 30.9 % — ABNORMAL LOW (ref 39.0–52.0)
Hemoglobin: 10 g/dL — ABNORMAL LOW (ref 13.0–17.0)
MCH: 29.2 pg (ref 26.0–34.0)
MCHC: 32.4 g/dL (ref 30.0–36.0)
MCV: 90.4 fL (ref 80.0–100.0)
Platelets: 326 10*3/uL (ref 150–400)
RBC: 3.42 MIL/uL — AB (ref 4.22–5.81)
RDW: 15.2 % (ref 11.5–15.5)
WBC: 15.4 10*3/uL — ABNORMAL HIGH (ref 4.0–10.5)
nRBC: 0.5 % — ABNORMAL HIGH (ref 0.0–0.2)

## 2018-08-23 MED ORDER — WARFARIN SODIUM 5 MG PO TABS
5.0000 mg | ORAL_TABLET | Freq: Once | ORAL | Status: AC
  Administered 2018-08-23: 5 mg via ORAL
  Filled 2018-08-23: qty 1

## 2018-08-23 MED ORDER — PIPERACILLIN-TAZOBACTAM IN DEX 2-0.25 GM/50ML IV SOLN
2.2500 g | Freq: Four times a day (QID) | INTRAVENOUS | Status: DC
  Administered 2018-08-23: 2.25 g via INTRAVENOUS
  Filled 2018-08-23 (×3): qty 50

## 2018-08-23 MED ORDER — VANCOMYCIN HCL IN DEXTROSE 1-5 GM/200ML-% IV SOLN
1000.0000 mg | Freq: Once | INTRAVENOUS | Status: AC
  Administered 2018-08-23: 1000 mg via INTRAVENOUS
  Filled 2018-08-23: qty 200

## 2018-08-23 MED ORDER — PROMETHAZINE HCL 25 MG/ML IJ SOLN
12.5000 mg | Freq: Once | INTRAMUSCULAR | Status: AC
  Administered 2018-08-23: 12.5 mg via INTRAVENOUS
  Filled 2018-08-23: qty 1

## 2018-08-23 NOTE — Progress Notes (Signed)
Pt continues to have increased lethargy and confusion. Pt is arouseable to painful stimuli. Pt is diaphoretic. Pt cbg is 124. BP is 96/78 Doppler is 96. Hr 103 ST spo2 95 on RA. On call Card  Fellow paged and gave orders. Will continue to monitor pt.

## 2018-08-23 NOTE — Progress Notes (Addendum)
Pt has increased lethargy and confusion. Pt is Awake and oriented to self. He is easily reoriented to place, time and situation. Pt is picking at " things" in the air and trying to eat IV line that the pt states is a hot dog. VAD coordinator called and MD made aware and CT of head is order. Will continue to monitor.

## 2018-08-23 NOTE — Progress Notes (Addendum)
POST OPERATIVE NOTE    CC:  F/u for surgery  HPI:  This is a 52 y.o. male with end-stage renal disease and LVAD.  He underwent left arm 1st stage BVT on 05/29/18 by Dr. Donzetta Matters.  Then on 08/09/18, he underwent fistulogram, which revealed the fistula was sclerotic and a LUA AVG was placed the same day.  He is on coumadin for his LVAD and he developed a hematoma in the LUA, which did improve and was treated conservatively.  His INR today is 1.9 and he is on a heparin gtt.   He was brought to the hospital yesterday from HD for emesis.  This morning, he is confused, was hypertensive.  A stat CT of the head was ordered last evening and revealed no acute intracranial process and mild chronic small vessel ischemic changes and old small left cerebellar infarct.   VVS is consulted for possibility of steal sx.  Pt states that he has some pain in his fingers as well as weakness.  He states this has been going on since he developed the hematoma and he says it has actually gotten a little bit better.  He states that the pain from the hematoma has improved.    Allergies  Allergen Reactions  . Metformin And Related Diarrhea    Current Facility-Administered Medications  Medication Dose Route Frequency Provider Last Rate Last Dose  . acetaminophen (TYLENOL) tablet 650 mg  650 mg Oral Q4H PRN Shirley Friar, PA-C      . amiodarone (PACERONE) tablet 200 mg  200 mg Oral Daily Shirley Friar, PA-C      . aspirin EC tablet 81 mg  81 mg Oral Daily Shirley Friar, PA-C      . atorvastatin (LIPITOR) tablet 40 mg  40 mg Oral q1800 Shirley Friar, PA-C   40 mg at 08/22/18 1844  . [START ON 08/24/2018] calcitRIOL (ROCALTROL) capsule 0.5 mcg  0.5 mcg Oral Q M,W,F Shirley Friar, PA-C      . citalopram (CELEXA) tablet 20 mg  20 mg Oral Daily Shirley Friar, PA-C      . docusate sodium (COLACE) capsule 100 mg  100 mg Oral Daily PRN Shirley Friar, PA-C      .  dronabinol (MARINOL) capsule 2.5 mg  2.5 mg Oral BID AC Shirley Friar, PA-C   2.5 mg at 08/22/18 1844  . erythromycin (E-MYCIN) tablet 250 mg  250 mg Oral TID Shirley Friar, PA-C   250 mg at 08/23/18 1700  . heparin ADULT infusion 100 units/mL (25000 units/228m sodium chloride 0.45%)  1,550 Units/hr Intravenous Continuous TShirley Friar PA-C 15.5 mL/hr at 08/23/18 0822 1,550 Units/hr at 08/23/18 01749 . hydrALAZINE (APRESOLINE) injection 10 mg  10 mg Intravenous Q6H PRN TShirley Friar PA-C   10 mg at 08/22/18 1710  . insulin aspart (novoLOG) injection 0-5 Units  0-5 Units Subcutaneous QHS TShirley Friar PA-C      . insulin aspart (novoLOG) injection 0-9 Units  0-9 Units Subcutaneous TID WC TShirley Friar PA-C      . insulin glargine (LANTUS) injection 10 Units  10 Units Subcutaneous Daily TShirley Friar PA-C      . isosorbide-hydrALAZINE (BIDIL) 20-37.5 MG per tablet 1.5 tablet  1.5 tablet Oral 3 times per day on Sun Tue Thu Sat TShirley Friar PA-C   1.5 tablet at 08/23/18 0810  . lidocaine-prilocaine (EMLA) cream 1 application  1 application Topical PRN  Shirley Friar, PA-C      . LORazepam (ATIVAN) injection 0.5 mg  0.5 mg Intravenous Q12H PRN Shirley Friar, PA-C   0.5 mg at 08/23/18 0809  . LORazepam (ATIVAN) tablet 0.5 mg  0.5 mg Oral Q12H PRN Shirley Friar, PA-C      . metoCLOPramide (REGLAN) tablet 10 mg  10 mg Oral TID Shirley Friar, PA-C   10 mg at 08/23/18 0825  . multivitamin (RENA-VIT) tablet 1 tablet  1 tablet Oral QHS Shirley Friar, PA-C      . ondansetron [facility]) injection 4 mg  4 mg Intravenous Q6H PRN Shirley Friar, PA-C   4 mg at 08/22/18 2139  . pantoprazole (PROTONIX) EC tablet 40 mg  40 mg Oral BID Shirley Friar, PA-C   40 mg at 08/23/18 2947  . promethazine (PHENERGAN) injection 12.5 mg  12.5 mg Intravenous Q6H PRN Shirley Friar, PA-C   12.5 mg at 08/22/18 1554  . promethazine (PHENERGAN) tablet 12.5 mg  12.5 mg Oral Q6H PRN Shirley Friar, PA-C      . sevelamer carbonate (RENVELA) tablet 4,000 mg  4,000 mg Oral TID WC Shirley Friar, PA-C      . traMADol Veatrice Bourbon) tablet 50 mg  50 mg Oral Q6H PRN Shirley Friar, PA-C      . warfarin (COUMADIN) tablet 5 mg  5 mg Oral ONCE-1800 Einar Grad, [facility]      . Warfarin - Pharmacist Dosing Inpatient   Does not apply q1800 Shirley Friar, PA-C         ROS:  See HPI  Physical Exam:  Vitals:   08/23/18 0545 08/23/18 0600  BP: (!) 86/72 (!) 85/69  Pulse: 87 89  Resp: 17 16  Temp:    SpO2: 99% 99%    Incision:  Left antecubital incision is healing nicely Extremities:  There is a brisk left radial/ulnar and palmar arch doppler signal (the left radial pulse is palpable).  He does have a doppler signal in the LUA AVG.  His left hand grip is weaker than the right; sensation is in tact.    Assessment/Plan:  This is a 52 y.o. male who is s/p: LUA AVG with 4-38m stretch PTFE 08/09/18 who subsequently developed a hematoma, which did improve and now presents with nausea and pain in his fingers on the left hand  -pt has brisk doppler signals left radial/ulnar and palmar arch.  He has had some pain in his fingers on his left hand since he developed the hematoma in his LUA and is most likely from that.  Doubt this is steal sx given the brisk signals in his wrist and palmar arch.  He tells me the sx has started to improve.  His LUA also looks better.  I was able to get a doppler signal over the graft as well.  -Dr. FOneida Alarwill be by to see the pt this afternoon.   SLeontine Locket PA-C [facility] [phone]  Pt with 2 separate issues.  He has pain over his graft which is typical post op.  He also has numbness and tingling in his left hand which could be consistent with neuropathy from his hematoma or steal.  Will  follow closely over the next few days to see if we can tease out which it is.  If steal will consider ligation of graft.  CRuta Hinds MD [facility] [place] CrossingOffice: [phone]  Pager: [phone]

## 2018-08-23 NOTE — Progress Notes (Signed)
Patient ID: [name], male   DOB: [birth year], 52 y.o.   MRN: [id number]    Advanced Heart Failure VAD Team Note  PCP-Cardiologist: No primary care provider on file.   Subjective:    Patient sent to ER from HD with recurrent nausea, he was kept in the hospital due to to elevated LDH and low INR.    Last night, he developed confusion/altered mental status and was difficult to awaken.  Head CT was done, no acute changes (old small left cerebellar infarction).  This morning, he is completely clear and back to normal.  Of note, he received meds for nausea in the ER and again on the floor yesterday.   Still with some nausea, not vomiting this morning though.   LDH 403 => 368, INR 1.9.   LVAD INTERROGATION:  HeartMate 3 LVAD:   Flow 3.2 liters/min, speed 5500, power 4.5, PI 8.4.  Multiple PI events with HD yesterday.    Objective:    Vital Signs:   Temp:  [98.1 F (36.7 C)-98.8 F (37.1 C)] 98.1 F (36.7 C) (12/19 0405) Pulse Rate:  [57-108] 89 (12/19 0600) Resp:  [6-27] 16 (12/19 0600) BP: (85-154)/(69-114) 85/69 (12/19 0600) SpO2:  [95 %-100 %] 99 % (12/19 0600) Weight:  [81 kg-81.6 kg] 81.4 kg (12/19 0615) Last BM Date: 08/21/18 Mean arterial Pressure 80s-90s  Intake/Output:   Intake/Output Summary (Last 24 hours) at 08/23/2018 0934 Last data filed at 08/23/2018 0415 Gross per 24 hour  Intake 302.94 ml  Output -  Net 302.94 ml     Physical Exam    General:  Well appearing. No resp difficulty HEENT: normal Neck: supple. JVP not elevated. Carotids 2+ bilat; no bruits. No lymphadenopathy or thyromegaly appreciated. Cor: Mechanical heart sounds with LVAD hum present. Lungs: clear Abdomen: soft, nontender, nondistended. No hepatosplenomegaly. No bruits or masses. Good bowel sounds. Driveline: C/D/I; securement device intact and driveline incorporated Extremities: no cyanosis, clubbing, rash, edema.  Swelling/ecchymosis LUE at AV graft site.  Neuro: alert &  orientedx3, cranial nerves grossly intact. moves all 4 extremities w/o difficulty. Affect pleasant   Telemetry   NSR, 80s (personally reviewed)  Labs   Basic Metabolic Panel: Recent Labs  Lab 08/17/18 0310 08/18/18 0252 08/22/18 1122 08/23/18 0245  NA 133* 139 134* 133*  K 3.7 4.4 4.4 4.9  CL 92* 99 97* 97*  CO2 26 31 22 24   GLUCOSE 183* 145* 107* 123*  BUN 41* 16 16 27*  CREATININE 6.98* 3.70* 4.20* 6.01*  CALCIUM 8.4* 8.4* 9.4 9.0    Liver Function Tests: Recent Labs  Lab 08/22/18 1122  AST 45*  ALT 19  ALKPHOS 116  BILITOT 1.1  PROT 7.6  ALBUMIN 3.1*   No results for input(s): LIPASE, AMYLASE in the last 168 hours. No results for input(s): AMMONIA in the last 168 hours.  CBC: Recent Labs  Lab 08/17/18 0310 08/18/18 0252 08/22/18 1122 08/23/18 0245  WBC 15.3* 12.4* 15.9* 15.4*  NEUTROABS  --   --  13.8*  --   HGB 8.1* 8.6* 9.7* 10.0*  HCT 25.8* 27.6* 31.1* 30.9*  MCV 89.9 89.9 89.9 90.4  PLT 275 289 315 326    INR: Recent Labs  Lab 08/17/18 0310 08/18/18 0252 08/21/18 08/22/18 1122 08/23/18 0531  INR 2.11 2.35 2.5 1.70 1.90    Other results:  EKG:    Imaging   Ct Head Wo Contrast  Result Date: 08/22/2018 CLINICAL DATA:  Altered mental status. History of cardiac  defibrillator, hypertension, hyperlipidemia, end-stage renal disease on dialysis and diabetes. EXAM: CT HEAD WITHOUT CONTRAST TECHNIQUE: Contiguous axial images were obtained from the base of the skull through the vertex without intravenous contrast. COMPARISON:  None. FINDINGS: BRAIN: No intraparenchymal hemorrhage, mass effect nor midline shift. Old small LEFT cerebellar infarct. Patchy supratentorial white matter hypodensities. The ventricles and sulci are normal. No acute large vascular territory infarcts. No abnormal extra-axial fluid collections. Basal cisterns are patent. VASCULAR: Unremarkable. SKULL/SOFT TISSUES: No skull fracture. No significant soft tissue swelling. LEFT  frontal scalp scarring. ORBITS/SINUSES: The included ocular globes and orbital contents are normal.Trace paranasal sinus mucosal thickening. Mastoid air cells are well aerated. OTHER: None. IMPRESSION: 1. No acute intracranial process. 2. Mild chronic small vessel ischemic changes and old small LEFT cerebellar infarct. Electronically Signed   By: Elon Alas M.D.   On: 08/22/2018 22:51   Dg Chest Portable 1 View  Result Date: 08/22/2018 CLINICAL DATA:  Nausea and vomiting EXAM: PORTABLE CHEST 1 VIEW COMPARISON:  08/10/2018 FINDINGS: Cardiac shadow remains enlarged. LVAD and defibrillator are again noted and stable. Left-sided dialysis catheter is noted in satisfactory position. The lungs are clear bilaterally. Elevation of the right hemidiaphragm is again seen. No other focal abnormality is noted. IMPRESSION: Support apparatus as described. No acute abnormality noted. Electronically Signed   By: Inez Catalina M.D.   On: 08/22/2018 11:37      Medications:     Scheduled Medications: . amiodarone  200 mg Oral Daily  . aspirin EC  81 mg Oral Daily  . atorvastatin  40 mg Oral q1800  . [START ON 08/24/2018] calcitRIOL  0.5 mcg Oral Q M,W,F  . citalopram  20 mg Oral Daily  . dronabinol  2.5 mg Oral BID AC  . erythromycin  250 mg Oral TID  . insulin aspart  0-5 Units Subcutaneous QHS  . insulin aspart  0-9 Units Subcutaneous TID WC  . insulin glargine  10 Units Subcutaneous Daily  . isosorbide-hydrALAZINE  1.5 tablet Oral 3 times per day on Sun Tue Thu Sat  . metoCLOPramide  10 mg Oral TID  . multivitamin  1 tablet Oral QHS  . pantoprazole  40 mg Oral BID  . sevelamer carbonate  4,000 mg Oral TID WC  . warfarin  5 mg Oral ONCE-1800  . Warfarin - Pharmacist Dosing Inpatient   Does not apply q1800     Infusions: . heparin 1,550 Units/hr (08/23/18 0822)     PRN Medications:  acetaminophen, docusate sodium, hydrALAZINE, lidocaine-prilocaine, LORazepam, LORazepam, ondansetron  (ZOFRAN) IV, promethazine, promethazine, traMADol   Patient Profile   [name] is a 52 y.o. malewith history of CAD s/p anterior MI in 2010 and NSTEMI in 3/18 with DES to pRCA and mLAD, diabetic, gastroparesis, Chrons disease, DMIIand ischemic cardiomyopathy, 10/2018s/p Heartmate 3 LVAD(emergent exchange 02/2018 due to outflow graft kink)and ESRD(HD MWF).  Assessment/Plan:    1. Elevated LDH and subtherapeutic INR: Elevated LDH may be from large resolving hematoma in left arm from AV graft surgery.  However, with history of hypercoagulability and prior pump thrombosis, I admitted him for heparin gtt while INR therapeutic.  LDH trending down today to 368 and INR up to 1.9.  - Continue heparin gtt today until INR > 2, likely stop tomorrow if INR continues to trend down.  2. Chronic systolic CHF: Ischemic cardiomyopathy. St Jude ICD. NSTEMI in March 2018 with DES to LAD and RCA, complicated by cardiogenic shock, low output requiring milrinone. Echo 8/18 with  EF 20-25%, moderate MR. CPX (8/18) with severe functional impairment due to HF. He is s/p HM3 LVAD placement. He had pump thrombosis 6/19 due to [facility] outflow graft kinking. He had pump exchange for another HM3.RHC showed low filling pressures and good cardiac output 08/09/18.  We requested that renal increase his dry weight based on RHC.  - Continue Bidil 1 tabTIDon non-HD days.  - INR 1.9. Goal INR 2-2.5. Continue heparin gtt today with elevated LDH.  - Continue ASA 81.  - He is now listed for heart/kidney at [facility]. Day 08/09/18 with optimized filling pressures and cardiac output. Normal PA pressure.  3. ESRD: Developed peri-operatively after HM3 placement. He is tolerating HD, currently via catheter but had LUE graft placed last admission. Hematoma post-AV LUE graft placement. VVS saw, suspects hematoma. The graft is not likely to be patent.  - Continue tramadol for left arm pain as needed.   - Will ask VVS to take a  look at his left arm, still with swelling and decreased grip left hand.  - MWF HD, dry weight has been increased based on RHC.  4. CAD: NSTEMI in 3/18. LHC with 99% ulcerated lesion proximal RCA with left to right collaterals, 95% mid LAD stenosis after mid LAD stent. s/p PCI to RCA and LAD on 11/25/16. No chest pain.  - Continue statin. 5. h/o DVTs: Factor V Leiden heterozygote.  6. Diabetic gastroparesis: Difficult to manage. He has seen gastroparesis specialist at [facility]. Surprisingly, gastric emptying study was normal. However, electrogastrogram showed poor gastric accomodation/capacity. Therefore, small frequent meals recommended. Also morning nausea thought to be GERD and Protonix was increased. Nausea seems to drive markedly high BP (resolves with nausea control). He was sent to ER from HD 12/18 with recurrent nausea, slowly improving.    - Continue reglan 5 mg tid/ac, chronic erythromycin, and Marinol.  - Continue Protonix BID. - Will use Phenergan prn. Works much better than Zofran.  - Make effort to eat small, more frequent meals.  7. DM: HgbA1c well-controlled No change. SSI while in house.  8. HTN: Continue Bidil on non HD days.  Hypertension seems to be driven by nausea (see above), so will try to minimize nausea.  9. PAD: Moderate bilateral disease on ABIs in 8/18. Denies claudication currently, no pedal No change.  10. Atrial flutter: Paroxysmal. Remains in NSR.  - On coumadin for LVAD.   11. Anemia: Received 1 PRBCs 12/9.  Hgb stable today at 10, no evidence for GI bleeding.  12. Altered mental status: Noted last night, appears to have completely recovered.  Suspect this may have been due to oversedation with multiple doses of phenergan and Ativan yesterday.  Head CT showed no acute changes.   I reviewed the LVAD parameters from today, and compared the results to the patient's prior recorded data.  No programming changes were made.  The LVAD is functioning within  specified parameters.  The patient performs LVAD self-test daily.  LVAD interrogation was negative for any significant power changes, alarms or PI events/speed drops.  LVAD equipment check completed and is in good working order.  Back-up equipment present.   LVAD education done on emergency procedures and precautions and reviewed exit site care.  Length of Stay: 1  Loralie Champagne, MD 08/23/2018, 9:34 AM  VAD Team --- VAD ISSUES ONLY--- Pager [phone] (7am - 7am)  Advanced Heart Failure Team  Pager [phone] (M-F; 7a - 4p)  Please contact [facility] for night-coverage after hours (4p -7a )  and weekends on [email or web address]

## 2018-08-23 NOTE — Progress Notes (Addendum)
ANTICOAGULATION CONSULT NOTE - [place] for warfarin + heparin Indication: LVAD/Factor 5 Leiden  Allergies  Allergen Reactions  . Metformin And Related Diarrhea    Patient Measurements: Height: 5' 7"  (170.2 cm) Weight: 179 lb 7.3 oz (81.4 kg) IBW/kg (Calculated) : 66.1 Heparin Dosing Weight: 81kg  Vital Signs: Temp: 98.1 F (36.7 C) (12/19 0405) Temp Source: Oral (12/19 0405) BP: 85/69 (12/19 0600) Pulse Rate: 89 (12/19 0600)  Labs: Recent Labs    08/21/18 08/22/18 1122 08/22/18 2148 08/23/18 0245 08/23/18 0531  HGB  --  9.7*  --  10.0*  --   HCT  --  31.1*  --  30.9*  --   PLT  --  315  --  326  --   LABPROT  --  19.7*  --   --  21.5*  INR 2.5 1.70  --   --  1.90  HEPARINUNFRC  --   --  0.52  --  0.24*  CREATININE  --  4.20*  --  6.01*  --     Estimated Creatinine Clearance: 14.5 mL/min (A) (by C-G formula based on SCr of 6.01 mg/dL (H)).   Medical History: Past Medical History:  Diagnosis Date  . Angina   . ASCVD (arteriosclerotic cardiovascular disease)    , Anterior infarction 2005, LAD diagonal bifurcation intervention 03/2004  . Automatic implantable cardiac defibrillator -St. Jude's       . Benign neoplasm of colon   . CHF (congestive heart failure) ([place])   . Chronic systolic heart failure ([place])   . Coronary artery disease     Widely patent previously placed stents in the left anterior   . Crohn's disease ([place])   . 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 Dialysis patient ([place])   . Dyspnea   . Gastroparesis   . GERD (gastroesophageal reflux disease)   . High cholesterol   . Hyperlipidemia   . Hypersomnolent    Previous diagnosis of narcolepsy  . Hypertension, essential   . Ischemic cardiomyopathy    Ejection fraction 15-20% catheterization 2010  . Type II or unspecified type diabetes mellitus without mention of complication, not stated as uncontrolled   . Unspecified gastritis and gastroduodenitis  without mention of hemorrhage     Assessment: 55 yoM on warfarin PTA for LVAD. Pt admitted for nausea/vomiting during HD. INR on admission 1.70 despite being 2.5 in clinic yesterday and LDH is up. Per MD, will continue warfarin and start IV heparin bridge given elevated LDH in setting of subtherapeutic INR.  Heparin level this morning is slightly low despite being therapeutic last night. Per RN there were line issues overnight so will continue current infusion rate and confirm at 1200. INR up to 1.90, LDH trending down, CBC stable, no S/Sx bleeding noted overnight.  *PTA Warfarin dose = 18m daily except 7.542mMonday  Goal of Therapy:  INR 2-3 Heparin level 0.3-0.7 units/ml Monitor platelets by anticoagulation protocol: Yes   Plan:  -Continue IV heparin 1550 units/hr -Recheck heparin level at 1200 -Warfarin 67m54mO x1 tonight - avoiding successive 7.67mg17mses as this led to supratherapeutic INRs last admisison -Daily PT/INR, heparin level, CBC, LDH  ADDENDUM: Repeat heparin level therapeutic, no changes to infusion. Continue plan noted above.   MichArrie SenatearmD, BCPS Clinical Pharmacist [phone] check AMION for all MC PSan Simeonbers 08/23/2018

## 2018-08-23 NOTE — Plan of Care (Signed)
  Problem: Health Behavior/Discharge Planning: Goal: Ability to manage health-related needs will improve Outcome: Progressing   Problem: Clinical Measurements: Goal: Ability to maintain clinical measurements within normal limits will improve Outcome: Progressing Goal: Will remain free from infection Outcome: Progressing Goal: Diagnostic test results will improve Outcome: Progressing Goal: Cardiovascular complication will be avoided Outcome: Progressing   Problem: Activity: Goal: Risk for activity intolerance will decrease Outcome: Progressing   Problem: Coping: Goal: Level of anxiety will decrease Outcome: Progressing   Problem: Elimination: Goal: Will not experience complications related to bowel motility Outcome: Progressing   Problem: Pain Managment: Goal: General experience of comfort will improve Outcome: Progressing   Problem: Safety: Goal: Ability to remain free from injury will improve Outcome: Progressing   Problem: Cardiac: Goal: LVAD will function as expected and patient will experience no clinical alarms Outcome: Progressing   Problem: Education: Goal: Patient will be able to verbalize current INR target range and antiplatelet therapy for discharge home Outcome: Progressing

## 2018-08-23 NOTE — Progress Notes (Signed)
LVAD Coordinator Rounding Note:  Pt admitted per Dr. Aundra Dubin 08/22/18 for nausea and vomiting during dialysis treatment at [facility].   HM III LVAD implanted on 06/12/17 by Dr. Prescott Gum under Destination Therapy criteria due to renal insufficiency. Pump exchange on 03/01/18 for outflow graft twist/obstruction.   Pt lying in bed, drowsy this am. He did not eat breakfast, still feeling nauseated.   Bedside nurse paged last night to report confusion x 2 hours; described pt as "picking at things in the air" and attempting to "bite IV tubing, thinking it was a hot dog". She reported pupils were equal and reactive, MOE x 4 to command, bilateral grips weak. She would re-orient pt to time and place, but he did not retain information. Nurse also reported pt was running high BP's, hydralazine was given. Nurse confirmed IV heparin was infusing. Dr. Aundra Dubin was updated - stat CT head ordered. Instructed nurse to hold any further medication that could contribute to confusion.  Noted BC's x 2 were drawn at 2:45 am this am and IV Vanc and Zosyn were started.  Vital signs: Temp: 98.8 HR:  89 Doppler Pressure: not done Auto BP:  112/99 (105) O2 Sat: 99% on RA Wt: 180>179.4 lbs  LVAD interrogation reveals:  Speed: 5500 Flow: 3.5 Power: 4.4w PI: 6.5 Alarms: none Events: 20 PI events on 08/22/18 Hematocrit: 31  Fixed speed: 5500 Low speed limit: 5200  Drive Line:  CDI. Weekly dressing kits.  Next dressing change will be due 08/24/18; bedside nurse may change dressing.   Labs: LDH trend: 403>368  INR trend: 1.70>1.90  WBC trend:  15.9>15.4  Infection: - 08/23/18 > BCs x 2 drawn - results pending; started IV antibiotics  Gtts: - 08/22/18 > IV heparin per PharmD  Anticoagulation Plan: - INR Goal: 2.0 - 3.0 - ASA Dose: 81 mg daily   Device: -St Jude dual  -Therapies: on 200 bpm - Monitor: on VT 150 bpm  Adverse Events on VAD: - ESRD post LVAD; started CVVH 06/15/17; HD started  06/20/17 - Hospitalization 11/5 - 07/16/17 > gastroparesis diagnosis after EGD - Hospitalization 11/26 - 08/04/17 > gastroparesis - Hospitalization 1/8 - 09/16/17>gastroparesis - [facility] to [facility]. Corliss Parish at [facility] - 10/31/17>rightupper arm arteriovenous graft with Artegraft; ligation of right first stage brachial vein transposition  - 12/11/13 elective admission for thrombectomy for AV graft in RUA  - 12/20/17 > intractable N/V - 01/19/18 > intractable N/V sent by EMS from dialysis center - 03/01/18 > outflow graft twist/occulsion requiring pump exchange - 05/23/18 > admitted for non-working fistula in RUA - 06/10/18 > admitted for intractable N/V - 08/08/18 > admitted for peri-op management for 2nd stage of AV fistula, found to be sclerotic. New LUE graft created from brachial artery to axillary vein. Developed spontaneous LUE hematoma.  - 08/22/18 > admitted for intractable N/V  Plan/Recommendations:  1. Please page VAD coordinator with any equipment questions or concerns. 2. VAD Coordinator will accompany pt to the OR for hematoma evacuation.   Zada Girt RN [place] Coordinator  Office: [phone]  24/7 Pager: [phone]

## 2018-08-24 ENCOUNTER — Encounter: Payer: Self-pay | Admitting: Vascular Surgery

## 2018-08-24 ENCOUNTER — Inpatient Hospital Stay (HOSPITAL_COMMUNITY): Admit: 2018-08-24 | Payer: Self-pay

## 2018-08-24 DIAGNOSIS — I5043 Acute on chronic combined systolic (congestive) and diastolic (congestive) heart failure: Secondary | ICD-10-CM

## 2018-08-24 LAB — PROTIME-INR
INR: 2.7
Prothrombin Time: 28.3 seconds — ABNORMAL HIGH (ref 11.4–15.2)

## 2018-08-24 LAB — GLUCOSE, CAPILLARY
Glucose-Capillary: 78 mg/dL (ref 70–99)
Glucose-Capillary: 82 mg/dL (ref 70–99)
Glucose-Capillary: 82 mg/dL (ref 70–99)
Glucose-Capillary: 66 mg/dL — ABNORMAL LOW (ref 70–99)

## 2018-08-24 LAB — BASIC METABOLIC PANEL
ANION GAP: 14 (ref 5–15)
BUN: 39 mg/dL — ABNORMAL HIGH (ref 6–20)
CO2: 23 mmol/L (ref 22–32)
Calcium: 8.6 mg/dL — ABNORMAL LOW (ref 8.9–10.3)
Chloride: 98 mmol/L (ref 98–111)
Creatinine, Ser: 8.42 mg/dL — ABNORMAL HIGH (ref 0.61–1.24)
GFR calc non Af Amer: 7 mL/min — ABNORMAL LOW (ref >60)
GFR, EST AFRICAN AMERICAN: 8 mL/min — AB (ref >60)
GLUCOSE: 61 mg/dL — AB (ref 70–99)
Potassium: 4.4 mmol/L (ref 3.5–5.1)
Sodium: 135 mmol/L (ref 135–145)

## 2018-08-24 LAB — CBC
HEMATOCRIT: 27.2 % — AB (ref 39.0–52.0)
Hemoglobin: 8.5 g/dL — ABNORMAL LOW (ref 13.0–17.0)
MCH: 28 pg (ref 26.0–34.0)
MCHC: 31.3 g/dL (ref 30.0–36.0)
MCV: 89.5 fL (ref 80.0–100.0)
NRBC: 1.1 % — AB (ref 0.0–0.2)
Platelets: 338 10*3/uL (ref 150–400)
RBC: 3.04 MIL/uL — ABNORMAL LOW (ref 4.22–5.81)
RDW: 15.4 % (ref 11.5–15.5)
WBC: 16.8 10*3/uL — AB (ref 4.0–10.5)

## 2018-08-24 LAB — HEPARIN LEVEL (UNFRACTIONATED): Heparin Unfractionated: 1.01 IU/mL — ABNORMAL HIGH (ref 0.30–0.70)

## 2018-08-24 LAB — LACTATE DEHYDROGENASE: LDH: 249 U/L — ABNORMAL HIGH (ref 98–192)

## 2018-08-24 MED ORDER — HEPARIN SODIUM (PORCINE) 1000 UNIT/ML IJ SOLN
INTRAMUSCULAR | Status: AC
  Filled 2018-08-24: qty 4

## 2018-08-24 MED ORDER — LORAZEPAM 2 MG/ML IJ SOLN
0.5000 mg | Freq: Three times a day (TID) | INTRAMUSCULAR | Status: DC | PRN
  Administered 2018-08-24 – 2018-08-25 (×3): 0.5 mg via INTRAVENOUS
  Filled 2018-08-24 (×3): qty 1

## 2018-08-24 MED ORDER — ONDANSETRON HCL 4 MG/2ML IJ SOLN
INTRAMUSCULAR | Status: AC
  Filled 2018-08-24: qty 2

## 2018-08-24 MED ORDER — LORAZEPAM 2 MG/ML IJ SOLN
0.5000 mg | Freq: Once | INTRAMUSCULAR | Status: AC
  Administered 2018-08-24: 0.5 mg via INTRAVENOUS

## 2018-08-24 MED ORDER — DEXTROSE 50 % IV SOLN
INTRAVENOUS | Status: AC
  Filled 2018-08-24: qty 50

## 2018-08-24 MED ORDER — WARFARIN SODIUM 5 MG PO TABS
5.0000 mg | ORAL_TABLET | Freq: Every day | ORAL | Status: DC
  Administered 2018-08-24: 5 mg via ORAL
  Filled 2018-08-24 (×2): qty 1

## 2018-08-24 MED ORDER — LORAZEPAM 2 MG/ML IJ SOLN
INTRAMUSCULAR | Status: AC
  Filled 2018-08-24: qty 1

## 2018-08-24 MED ORDER — HEPARIN SODIUM (PORCINE) 1000 UNIT/ML IJ SOLN
3.4000 mL | Freq: Once | INTRAMUSCULAR | Status: AC
  Administered 2018-08-24: 3400 [IU] via INTRAVENOUS

## 2018-08-24 NOTE — Progress Notes (Signed)
  Paged for low grade fever of 100.0  Blood Cx 12/19 NGTD CXR clear 08/22/18  Will follow for now.    Legrand Como [address]" [place], PA-C 08/24/2018 4:14 PM

## 2018-08-24 NOTE — Progress Notes (Signed)
Paged [name] tillery pa regarding pt having temp 100 and doppler remaining elevated, will continue to monitor. Tylenol and hydralazine iv given.

## 2018-08-24 NOTE — Progress Notes (Signed)
ANTICOAGULATION CONSULT NOTE - [place] for warfarin + heparin Indication: LVAD/Factor 5 Leiden  Allergies  Allergen Reactions  . Metformin And Related Diarrhea    Patient Measurements: Height: 5' 7"  (170.2 cm) Weight: 178 lb 12.7 oz (81.1 kg) IBW/kg (Calculated) : 66.1 Heparin Dosing Weight: 81kg  Vital Signs: Temp: 98.2 F (36.8 C) (12/20 0750) Temp Source: Oral (12/20 0750) BP: 147/92 (12/20 1130) Pulse Rate: 98 (12/20 1130)  Labs: Recent Labs    08/22/18 1122  08/23/18 0245 08/23/18 0531 08/23/18 1206 08/24/18 0333  HGB 9.7*  --  10.0*  --   --  8.5*  HCT 31.1*  --  30.9*  --   --  27.2*  PLT 315  --  326  --   --  338  LABPROT 19.7*  --   --  21.5*  --  28.3*  INR 1.70  --   --  1.90  --  2.70  HEPARINUNFRC  --    < >  --  0.24* 0.61 1.01*  CREATININE 4.20*  --  6.01*  --   --  8.42*   < > = values in this interval not displayed.    Estimated Creatinine Clearance: 10.3 mL/min (A) (by C-G formula based on SCr of 8.42 mg/dL (H)).   Medical History: Past Medical History:  Diagnosis Date  . Angina   . ASCVD (arteriosclerotic cardiovascular disease)    , Anterior infarction 2005, LAD diagonal bifurcation intervention 03/2004  . Automatic implantable cardiac defibrillator -[employer] Jude's       . Benign neoplasm of colon   . CHF (congestive heart failure) ([place])   . Chronic systolic heart failure ([place])   . Coronary artery disease     Widely patent previously placed stents in the left anterior   . Crohn's disease ([place])   . 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 Dialysis patient ([place])   . Dyspnea   . Gastroparesis   . GERD (gastroesophageal reflux disease)   . High cholesterol   . Hyperlipidemia   . Hypersomnolent    Previous diagnosis of narcolepsy  . Hypertension, essential   . Ischemic cardiomyopathy    Ejection fraction 15-20% catheterization 2010  . Type II or unspecified type diabetes mellitus without  mention of complication, not stated as uncontrolled   . Unspecified gastritis and gastroduodenitis without mention of hemorrhage     Assessment: 6 yoM on warfarin PTA for LVAD. Pt admitted for nausea/vomiting during HD. INR on admission 1.70 despite being 2.5 in clinic yesterday and LDH was up 400. Per MD, will continue warfarin and start IV heparin bridge given elevated LDH in setting of subtherapeutic INR.  Heparin level up 1 - above goal  this am on heparin drip 1550 uts/hr INR up 2.7 at goal and LDH down to 249  - will stop heparin today. CBC stable, no S/Sx bleeding noted overnight.  *PTA Warfarin dose = 32m daily except 7.538mMonday  Goal of Therapy:  INR 2-3 Heparin level 0.3-0.7 units/ml Monitor platelets by anticoagulation protocol: Yes   Plan:  Stop heparin Eating little with N/V - will carefully dose warfarin  -Warfarin 23m93mO daily  -Daily PT/INR, heparin level, CBC, LDH  LisBonnita Nasutiarm.D. CPP, BCPS Clinical Pharmacist [phone]/20/2019 12:23 PM

## 2018-08-24 NOTE — Progress Notes (Addendum)
[facility]  Subjective  - Patient states pain and discomfort are improving in left UE.   Objective 135/90 84 98.2 F (36.8 C) (Oral) 14 96%  Intake/Output Summary (Last 24 hours) at 08/24/2018 0857 Last data filed at 08/24/2018 0800 Gross per 24 hour  Intake 550.15 ml  Output 0 ml  Net 550.15 ml    Left UE with edema and ecchymosis surrounding track site of UE graft. Posterior compartment and forearm soft. Palpable radial pulse Left UE, active range of motion minimal in fingers, grip 3/5.  Assessment/Planning: S/P left UE AV graft placement 08/09/2018 with development of hematoma surrounding graft track secondary to Coumadin dose for LVAD.  He states his pain is improving.  I encouraged him to improve mobility of his hand by making a fist.  We will follow.  Hopefully his pain and mobility will continue to improve with time.    Roxy Horseman 08/24/2018 8:57 AM -- Still with numbness and weakness in left hand no real worsening while on HD Observe over weekend if not improved consider graft ligation next week  Ruta Hinds, MD [facility]: [phone] Pager: [phone]  Laboratory Lab Results: Recent Labs    08/23/18 0245 08/24/18 0333  WBC 15.4* 16.8*  HGB 10.0* 8.5*  HCT 30.9* 27.2*  PLT 326 338   BMET Recent Labs    08/23/18 0245 08/24/18 0333  NA 133* 135  K 4.9 4.4  CL 97* 98  CO2 24 23  GLUCOSE 123* 61*  BUN 27* 39*  CREATININE 6.01* 8.42*  CALCIUM 9.0 8.6*    COAG Lab Results  Component Value Date   INR 2.70 08/24/2018   INR 1.90 08/23/2018   INR 1.70 08/22/2018   No results found for: PTT

## 2018-08-24 NOTE — Discharge Summary (Addendum)
Advanced Heart Failure Team  Discharge Summary   Patient ID: [name] MRN: [id number], DOB/AGE: 1966-04-22 52 y.o. Admit date: 08/22/2018 D/C date:     08/27/2018   Primary Discharge Diagnoses:  1. Elevated LDH and subtherapeutic INR 3. ESRD 4. CAD: NSTEMI in 3/18. 5. h/o DVTs: Factor V Leiden heterozygote. 6. Recurrent nausea and vomitting diabetic gastroparesis  7. DM2 8. HTN 9. PAD 10. Atrial flutter: Paroxysmal. 11. Anemia:  12. Altered mental status: Resolved 13. LUE hematoma due to failed LUE dialysis graft  Hospital Course:   [name] is a 52 y.o. male with history of CAD s/p anterior MI in 2010 and NSTEMI in 3/18 with DES to pRCA and mLAD, diabetic, gastroparesis, Chrons disease, DMIIand ischemic cardiomyopathy, 10/2018s/p Heartmate 3 LVAD(emergent exchange 02/2018 due to outflow graft kink)and ESRD(HD MWF).  Admitted 08/22/18 with N/V from HD. Treated in ED with Ativan and Phenergan. LDH elevated on arrival with subtherapeutic INR. It was thought that LDH elevation was most likely from his recent LUE graft and subsequent hematoma. Covered with heparin while awaiting therapeutic INR. VVS following and recommended following over the weekend and if hand pain not improved to attempt graft ligation. VVS and OT saw again and recommended OT as outpatient and continuing to follow left hand strength.   Hospital course complicated by altered mental status in setting of multiple doses of phenergan and Ativan. Head CT 08/22/18 showed no changes.   LDH trended down on heparin and with adjustment of his INR. Not thought to be due to pump thrombosis.   Examined am of 08/27/18 and determined stable for discharge home with close follow up as below. He will check INR on Thursday on home machine and send to HF pharmacist.  LVAD Interrogation HM 3: Speed: 5500 Flow: 4.6 PI: 1.9 Power: 4.0. ~20 PI events yesterday post HD.   Discharge Weight: 181 lbs Discharge Vitals:  Blood pressure 112/80, pulse 79, temperature 98.6 F (37 C), temperature source Oral, resp. rate 13, height 5' 7"  (1.702 m), weight 82.1 kg, SpO2 100 %.  Labs: Lab Results  Component Value Date   WBC 14.0 (H) 08/27/2018   HGB 8.5 (L) 08/27/2018   HCT 27.9 (L) 08/27/2018   MCV 94.9 08/27/2018   PLT 310 08/27/2018    Recent Labs  Lab 08/22/18 1122  08/27/18 0711  NA 134*   < > 132*  K 4.4   < > 4.2  CL 97*   < > 98  CO2 22   < > 25  BUN 16   < > 18  CREATININE 4.20*   < > 6.07*  CALCIUM 9.4   < > 7.8*  PROT 7.6  --   --   BILITOT 1.1  --   --   ALKPHOS 116  --   --   ALT 19  --   --   AST 45*  --   --   GLUCOSE 107*   < > 244*   < > = values in this interval not displayed.   Lab Results  Component Value Date   CHOL 128 05/04/2017   HDL 50 05/04/2017   LDLCALC 54 05/04/2017   TRIG 118 05/04/2017   BNP (last 3 results) Recent Labs    03/02/18 0400 03/08/18 0008 03/15/18 0106  BNP 694.1* 1,576.6* 811.3*    ProBNP (last 3 results) No results for input(s): PROBNP in the last 8760 hours.   Diagnostic Studies/Procedures   No results found.  Discharge Medications   Allergies as of 08/27/2018      Reactions   Metformin And Related Diarrhea      Medication List    TAKE these medications   amiodarone 200 MG tablet Commonly known as:  PACERONE Take 200 mg by mouth daily.   aspirin EC 81 MG tablet Take 1 tablet (81 mg total) by mouth daily.   atorvastatin 40 MG tablet Commonly known as:  LIPITOR Take 1 tablet (40 mg total) by mouth daily at 6 PM.   calcitRIOL 0.5 MCG capsule Commonly known as:  ROCALTROL Take 1 capsule (0.5 mcg total) by mouth every Monday, Wednesday, and Friday.   citalopram 20 MG tablet Commonly known as:  CELEXA Take 1 tablet (20 mg total) by mouth daily.   docusate sodium 100 MG capsule Commonly known as:  COLACE Take 100 mg by mouth daily as needed for mild constipation.   dronabinol 2.5 MG capsule Commonly known as:   MARINOL Take 1 capsule (2.5 mg total) by mouth 2 (two) times daily before lunch and supper.   erythromycin 250 MG tablet Commonly known as:  E-MYCIN Take 1 tablet (250 mg total) by mouth 3 (three) times daily.   insulin aspart 100 UNIT/ML injection Commonly known as:  novoLOG Inject 5 Units into the skin 3 (three) times daily with meals.   insulin glargine 100 UNIT/ML injection Commonly known as:  LANTUS Inject 0.1 mLs (10 Units total) into the skin daily.   isosorbide-hydrALAZINE 20-37.5 MG tablet Commonly known as:  BIDIL Take 1.5 tablets by mouth every 8 (eight) hours. Take 1.5 tablets every 8 hours on non dialysis days. What changed:    how much to take  when to take this  additional instructions   lidocaine-prilocaine cream Commonly known as:  EMLA Apply 1 application topically as needed (to numb port).   LORazepam 0.5 MG tablet Commonly known as:  ATIVAN Take 1 tablet (0.5 mg total) by mouth every 12 (twelve) hours as needed (nausea).   metoCLOPramide 10 MG tablet Commonly known as:  REGLAN Take 1 tablet (10 mg total) by mouth 3 (three) times daily.   multivitamin Tabs tablet Take 1 tablet by mouth daily.   pantoprazole 40 MG tablet Commonly known as:  PROTONIX Take 40 mg by mouth 2 (two) times daily.   promethazine 12.5 MG tablet Commonly known as:  PHENERGAN Take 1 tablet (12.5 mg total) by mouth every 6 (six) hours as needed for nausea or vomiting.   sevelamer carbonate 800 MG tablet Commonly known as:  RENVELA Take 1,600-4,000 mg by mouth See admin instructions. 4033m three times daily with meals and 16071mtwice daily with snacks   traMADol 50 MG tablet Commonly known as:  ULTRAM Take 1 tablet (50 mg total) by mouth every 6 (six) hours as needed for moderate pain.   warfarin 5 MG tablet Commonly known as:  COUMADIN Take as directed. If you are unsure how to take this medication, talk to your nurse or doctor. Original instructions:  Take 1 tablet  (5 mg total) by mouth See admin instructions. Take 5 mg (1 tablet) daily       Disposition   The patient will be discharged in stable condition to home with close follow up as below.  Discharge Instructions    (HEART FAILURE PATIENTS) Call MD:  Anytime you have any of the following symptoms: 1) 3 pound weight gain in 24 hours or 5 pounds in 1 week 2) shortness of breath,  with or without a dry hacking cough 3) swelling in the hands, feet or stomach 4) if you have to sleep on extra pillows at night in order to breathe.   Complete by:  As directed    Diet - low sodium heart healthy   Complete by:  As directed    Increase activity slowly   Complete by:  As directed    STOP any activity that causes chest pain, shortness of breath, dizziness, sweating, or exessive weakness   Complete by:  As directed      Follow-up Information    [facility] Follow up on 09/04/2018.   Specialty:  Cardiology Why:  at 1000 am for post hospital follow up. Code for parking is 1900 Contact information: [address] [id number] [place] [phone]            Duration of Discharge Encounter: Greater than 35 minutes   Signed, Annamaria Helling  08/27/2018, 3:55 PM   Patient seen and examined with the above-signed Advanced Practice Provider and/or Housestaff. I personally reviewed laboratory data, imaging studies and relevant notes. I independently examined the patient and formulated the important aspects of the plan. I have edited the note to reflect any of my changes or salient points. I have personally discussed the plan with the patient and/or family.  Tolerating HD well. N/V much improved. LUE hematoma resolving. Seen by VVS and OT and given exercises for left hand. VAD interrogated personally. Parameters stable. INR 2.49. Discussed dosing with PharmD personally. [place] for d/c today with close f/u.   [name], MD  6:32 PM

## 2018-08-24 NOTE — Progress Notes (Signed)
LVAD Coordinator Rounding Note:  Pt admitted per Dr. Aundra Dubin 08/22/18 for nausea and vomiting during dialysis treatment at [facility].   HM III LVAD implanted on 06/12/17 by Dr. Prescott Gum under Destination Therapy criteria due to renal insufficiency. Pump exchange on 03/01/18 for outflow graft twist/obstruction.   Pt getting dialysis, sleeping. Nurse reported he became nauseated just prior to starting dialysis and was very "anxious" this am. He got extra one time ativan dose. Requesting to increase prn ativan from q 12 to 1 8 hrs; reviewed with Dr. Aundra Dubin and he approved.  Dialysis nurse pulling one liter fluid today and reports "almost finished" - see vad parameters recorded at this time.   Vital signs: Temp:  99.5 HR:  94 Doppler Pressure:  122 Auto BP:  120/61 (79) O2 Sat: 998 on RA Wt: 180>179.4>178.5>176.5 lbs  LVAD interrogation reveals:  Speed: 5500 Flow: 2.9 Power: 4.7w PI: 8.6 Alarms:  Events:  Hematocrit: 27  Fixed speed: 5500 Low speed limit: 5200  Drive Line:  CDI. Weekly dressing kits.  Next dressing change due 08/24/18; bedside nurse may change dressing.   Labs: LDH trend: [phone]  INR trend: 1.70>1.90>2.70  WBC trend:  15.9>15.4>16.8  Infection: - 08/23/18 > BCs x 2 drawn - results pending  Gtts: - 08/22/18 > IV heparin per PharmD > stopped 08/24/18  Anticoagulation Plan: - INR Goal: 2.0 - 3.0 - ASA Dose: 81 mg daily   Device: -St Jude dual  -Therapies: on 200 bpm - Monitor: on VT 150 bpm  Adverse Events on VAD: - ESRD post LVAD; started CVV[facility] 06/15/17; HD started 06/20/17 - Hospitalization 11/5 - 07/16/17 > gastroparesis diagnosis after EGD - Hospitalization 11/26 - 08/04/17 > gastroparesis - Hospitalization 1/8 - 09/16/17>gastroparesis - [facility] to [facility] Corliss [facility] - 10/31/17>rightupper arm arteriovenous graft with Artegraft; ligation of right first stage brachial vein transposition  - 12/11/13 elective admission for thrombectomy  for AV graft in RUA  - 12/20/17 > intractable N/V - 01/19/18 > intractable N/V sent by EMS from dialysis center - 03/01/18 > outflow graft twist/occulsion requiring pump exchange - 05/23/18 > admitted for non-working fistula in RUA - 06/10/18 > admitted for intractable N/V - 08/08/18 > admitted for peri-op management for 2nd stage of AV fistula, found to be sclerotic. New LUE graft created from brachial artery to axillary vein. Developed spontaneous LUE hematoma.  - 08/22/18 > admitted for intractable N/V  Plan/Recommendations:  1. Please page VAD coordinator with any equipment questions or concerns. 2. Weekly VAD dressing changes - due 08/24/18; [name] Patten, bedside nurse to perform.   Zada Girt RN [place] Coordinator  Office: [phone]  24/7 Pager: [phone]

## 2018-08-24 NOTE — Progress Notes (Addendum)
[name] Progress Note  Subjective: on HD no c/o's.   Vitals:   08/24/18 1100 08/24/18 1130 08/24/18 1200 08/24/18 1203  BP: 130/90 (!) 147/92 (!) 135/94 (!) 128/98  Pulse: 90 98 100 94  Resp: 18 17 16 18   Temp:    98.9 F (37.2 C)  TempSrc:    Oral  SpO2:    98%  Weight:    80.1 kg  Height:        Inpatient medications: . heparin      . amiodarone  200 mg Oral Daily  . aspirin EC  81 mg Oral Daily  . atorvastatin  40 mg Oral q1800  . calcitRIOL  0.5 mcg Oral Q M,W,F  . citalopram  20 mg Oral Daily  . dronabinol  2.5 mg Oral BID AC  . erythromycin  250 mg Oral TID  . insulin aspart  0-5 Units Subcutaneous QHS  . insulin aspart  0-9 Units Subcutaneous TID WC  . insulin glargine  10 Units Subcutaneous Daily  . isosorbide-hydrALAZINE  1.5 tablet Oral 3 times per day on Sun Tue Thu Sat  . metoCLOPramide  10 mg Oral TID  . multivitamin  1 tablet Oral QHS  . pantoprazole  40 mg Oral BID  . sevelamer carbonate  4,000 mg Oral TID WC  . warfarin  5 mg Oral q1800  . Warfarin - Pharmacist Dosing Inpatient   Does not apply q1800    acetaminophen, docusate sodium, hydrALAZINE, lidocaine-prilocaine, LORazepam, LORazepam, ondansetron (ZOFRAN) IV, promethazine, promethazine, traMADol  Iron/TIBC/Ferritin/ %Sat    Component Value Date/Time   IRON 40 (L) 11/02/2017 0016   TIBC 188 (L) 11/02/2017 0016   FERRITIN 523 (H) 11/02/2017 0016   IRONPCTSAT 21 11/02/2017 0016   Exam: General: Well appearing man, NAD Heart: Continuous LAD hum Lungs: CTA anteriorly Extremities: No LE edema Dialysis Access: TDC in chest, new LUE AVG + arm edema/ecchymosis    Dialysis Orders: MWF [facility] 4h 13mn 400/800 80.5kg 2K/3Ca bath Hep none TDC/ new LUA AVG (12/5, maturing) - Calcitriol 134m PO q HD - ESA mircera 100 ug q 2 wks, last 12/16  Assessment/Plan: 1. Elevated LDH/ sub Rx INR: admitted for IV heparin while INR subRx.  2. SP new LUE AV graft: on 12/5 by Dr.  CaDonzetta Mattersw/ postop LUE edema, possibly hematoma formation.  AVG's for LVAD pt's may not exhibit a normal bruit / thrill but still be patent, this is due to non-pulsatile flow.  3. ESRD on HD: cont HD MWF via TDC.   4. Hypertension/volume:BP controlled, no edema on exam  5. Anemia:Hgb 8.5, had darbe 100 ug on 12/16, next dose due 12/30 6. Metabolic bone disease:cont Renvela and Calcitriol 7. T2DM: Per primary 8. CAD/severe ICM with Heartmate 3 LVAD/ HTN: Per HF team. [facility] pager [phone] 08/24/2018, 12:28 PM   Recent Labs  Lab 08/22/18 1122 08/23/18 0245 08/23/18 0531 08/24/18 0333  NA 134* 133*  --  135  K 4.4 4.9  --  4.4  CL 97* 97*  --  98  CO2 22 24  --  23  GLUCOSE 107* 123*  --  61*  BUN 16 27*  --  39*  CREATININE 4.20* 6.01*  --  8.42*  CALCIUM 9.4 9.0  --  8.6*  ALBUMIN 3.1*  --   --   --   INR 1.70  --  1.90 2.70   Recent Labs  Lab 08/22/18 1122  AST 45*  ALT 19  ALKPHOS 116  BILITOT 1.1  PROT 7.6   Recent Labs  Lab 08/22/18 1122 08/23/18 0245 08/24/18 0333  WBC 15.9* 15.4* 16.8*  NEUTROABS 13.8*  --   --   HGB 9.7* 10.0* 8.5*  HCT 31.1* 30.9* 27.2*  MCV 89.9 90.4 89.5  PLT 315 326 338

## 2018-08-24 NOTE — Progress Notes (Addendum)
Patient ID: [name], male   DOB: 1965-12-10, 52 y.o.   MRN: [id number]    Advanced Heart Failure VAD Team Note  PCP-Cardiologist: No primary care provider on file.   Subjective:    Patient sent to ER from HD with recurrent nausea, he was kept in the hospital due to to elevated LDH and low INR.    08/22/18 he developed confusion/altered mental status and was difficult to awaken.  Head CT was done, no acute changes (old small left cerebellar infarction).  The following morning, he was clear and at his baseline.   Remains fatigued and nauseated this am on HD. Denies SOB. No overt bleeding with Hgb 10 -> 8.5  LDH 403 => 368 => 249, INR 2.7  LVAD Interrogation HM 3: Speed: 5500 Flow: 3.7 PI: 5.1 Power: 4.0. More PI events when on HD.   Objective:    Vital Signs:   Temp:  [97.8 F (36.6 C)-99.1 F (37.3 C)] 98.2 F (36.8 C) (12/20 0750) Pulse Rate:  [74-103] 84 (12/20 0830) Resp:  [14-23] 14 (12/20 0830) BP: (74-149)/(57-98) 135/90 (12/20 0830) SpO2:  [96 %-99 %] 96 % (12/20 0728) Weight:  [81 kg-81.1 kg] 81.1 kg (12/20 0750) Last BM Date: 08/22/18 Mean arterial Pressure 80-90s  Intake/Output:   Intake/Output Summary (Last 24 hours) at 08/24/2018 0911 Last data filed at 08/24/2018 0800 Gross per 24 hour  Intake 430.15 ml  Output 0 ml  Net 430.15 ml     Physical Exam    General: NAD  HEENT: Normal. Neck: Supple, JVP 7-8 cm. Carotids OK.  Cardiac:  Mechanical heart sounds with LVAD hum present.  Lungs:  CTAB, normal effort.  Abdomen:  NT, ND, no HSM. No bruits or masses. +BS  LVAD exit site: Well-healed and incorporated. Dressing dry and intact. No erythema or drainage. Stabilization device present and accurately applied. Driveline dressing changed daily per sterile technique. Extremities:  Warm and dry. No cyanosis, clubbing, rash, or edema.  Neuro:  Alert & oriented x 3. Cranial nerves grossly intact. Moves all 4 extremities w/o difficulty. Affect pleasant       Telemetry   NSr 70-80s, personally reviewed.   Labs   Basic Metabolic Panel: Recent Labs  Lab 08/18/18 0252 08/22/18 1122 08/23/18 0245 08/24/18 0333  NA 139 134* 133* 135  K 4.4 4.4 4.9 4.4  CL 99 97* 97* 98  CO2 31 22 24 23   GLUCOSE 145* 107* 123* 61*  BUN 16 16 27* 39*  CREATININE 3.70* 4.20* 6.01* 8.42*  CALCIUM 8.4* 9.4 9.0 8.6*    Liver Function Tests: Recent Labs  Lab 08/22/18 1122  AST 45*  ALT 19  ALKPHOS 116  BILITOT 1.1  PROT 7.6  ALBUMIN 3.1*   No results for input(s): LIPASE, AMYLASE in the last 168 hours. No results for input(s): AMMONIA in the last 168 hours.  CBC: Recent Labs  Lab 08/18/18 0252 08/22/18 1122 08/23/18 0245 08/24/18 0333  WBC 12.4* 15.9* 15.4* 16.8*  NEUTROABS  --  13.8*  --   --   HGB 8.6* 9.7* 10.0* 8.5*  HCT 27.6* 31.1* 30.9* 27.2*  MCV 89.9 89.9 90.4 89.5  PLT 289 315 326 338    INR: Recent Labs  Lab 08/18/18 0252 08/21/18 08/22/18 1122 08/23/18 0531 08/24/18 0333  INR 2.35 2.5 1.70 1.90 2.70    Other results:  EKG:    Imaging   Ct Head Wo Contrast  Result Date: 08/22/2018 CLINICAL DATA:  Altered mental status.  History of cardiac defibrillator, hypertension, hyperlipidemia, end-stage renal disease on dialysis and diabetes. EXAM: CT HEAD WITHOUT CONTRAST TECHNIQUE: Contiguous axial images were obtained from the base of the skull through the vertex without intravenous contrast. COMPARISON:  None. FINDINGS: BRAIN: No intraparenchymal hemorrhage, mass effect nor midline shift. Old small LEFT cerebellar infarct. Patchy supratentorial white matter hypodensities. The ventricles and sulci are normal. No acute large vascular territory infarcts. No abnormal extra-axial fluid collections. Basal cisterns are patent. VASCULAR: Unremarkable. SKULL/SOFT TISSUES: No skull fracture. No significant soft tissue swelling. LEFT frontal scalp scarring. ORBITS/SINUSES: The included ocular globes and orbital contents are  normal.Trace paranasal sinus mucosal thickening. Mastoid air cells are well aerated. OTHER: None. IMPRESSION: 1. No acute intracranial process. 2. Mild chronic small vessel ischemic changes and old small LEFT cerebellar infarct. Electronically Signed   By: Elon Alas M.D.   On: 08/22/2018 22:51   Dg Chest Portable 1 View  Result Date: 08/22/2018 CLINICAL DATA:  Nausea and vomiting EXAM: PORTABLE CHEST 1 VIEW COMPARISON:  08/10/2018 FINDINGS: Cardiac shadow remains enlarged. LVAD and defibrillator are again noted and stable. Left-sided dialysis catheter is noted in satisfactory position. The lungs are clear bilaterally. Elevation of the right hemidiaphragm is again seen. No other focal abnormality is noted. IMPRESSION: Support apparatus as described. No acute abnormality noted. Electronically Signed   By: Inez Catalina M.D.   On: 08/22/2018 11:37     Medications:     Scheduled Medications: . amiodarone  200 mg Oral Daily  . aspirin EC  81 mg Oral Daily  . atorvastatin  40 mg Oral q1800  . calcitRIOL  0.5 mcg Oral Q M,W,F  . citalopram  20 mg Oral Daily  . dronabinol  2.5 mg Oral BID AC  . erythromycin  250 mg Oral TID  . insulin aspart  0-5 Units Subcutaneous QHS  . insulin aspart  0-9 Units Subcutaneous TID WC  . insulin glargine  10 Units Subcutaneous Daily  . isosorbide-hydrALAZINE  1.5 tablet Oral 3 times per day on Sun Tue Thu Sat  . metoCLOPramide  10 mg Oral TID  . multivitamin  1 tablet Oral QHS  . pantoprazole  40 mg Oral BID  . sevelamer carbonate  4,000 mg Oral TID WC  . Warfarin - Pharmacist Dosing Inpatient   Does not apply q1800    Infusions: . heparin 1,550 Units/hr (08/24/18 0358)    PRN Medications: acetaminophen, docusate sodium, hydrALAZINE, lidocaine-prilocaine, LORazepam, LORazepam, ondansetron (ZOFRAN) IV, promethazine, promethazine, traMADol   Patient Profile   [name] is a 52 y.o. malewith history of CAD s/p anterior MI in 2010 and  NSTEMI in 3/18 with DES to pRCA and mLAD, diabetic, gastroparesis, Chrons disease, DMIIand ischemic cardiomyopathy, 10/2018s/p Heartmate 3 LVAD(emergent exchange 02/2018 due to outflow graft kink)and ESRD(HD MWF).  Assessment/Plan:    1. Elevated LDH and subtherapeutic INR: Elevated LDH may be from large resolving hematoma in left arm from AV graft surgery.  However, with history of hypercoagulability and prior pump thrombosis, I admitted him for heparin gtt while INR therapeutic.  LDH trending down 368 -> 249.  - Stop heparin with INR 2.7. Discussed dosing with PharmD personally. 2. Chronic systolic CHF: Ischemic cardiomyopathy. St Jude ICD. NSTEMI in March 2018 with DES to LAD and RCA, complicated by cardiogenic shock, low output requiring milrinone. Echo 8/18 with EF 20-25%, moderate MR. CPX (8/18) with severe functional impairment due to HF. He is s/p HM3 LVAD placement. He had pump thrombosis 6/19 due  to [facility] outflow graft kinking. He had pump exchange for another HM3.RHC showed low filling pressures and good cardiac output 08/09/18.  We requested that renal increase his dry weight based on RHC.  - Continue Bidil 1 tabTIDon non-HD days.  - INR 2.7. Goal INR 2-2.5. Stop heparin gtt.  - Continue ASA 81.  - He is now listed for heart/kidney at [facility]. [place] 08/09/18 with optimized filling pressures and cardiac output. Normal PA pressure.  3. ESRD: Developed peri-operatively after HM3 placement. He is tolerating HD, currently via catheter but had LUE graft placed last admission. Hematoma post-AV LUE graft placement. VVS saw, suspects hematoma. The graft is not functional.  - Continue tramadol for left arm pain as needed.   - VVS following for his left arm. They think is normal course of healing.  - MWF HD, dry weight has been increased based on RHC.  4. CAD: NSTEMI in 3/18. LHC with 99% ulcerated lesion proximal RCA with left to right collaterals, 95% mid LAD stenosis after mid LAD  stent. s/p PCI to RCA and LAD on 11/25/16.  - No s/s of ischemia.    - Continue statin. 5. h/o DVTs: Factor V Leiden heterozygote.  6. Diabetic gastroparesis: Difficult to manage. He has seen gastroparesis specialist at [facility]. Surprisingly, gastric emptying study was normal. However, electrogastrogram showed poor gastric accomodation/capacity. Therefore, small frequent meals recommended. Also morning nausea thought to be GERD and Protonix was increased. Nausea seems to drive markedly high BP (resolves with nausea control). He was sent to ER from HD 12/18 with recurrent nausea, slowly improving.    - Continue reglan 5 mg tid/ac, chronic erythromycin, and Marinol.  - Continue Protonix BID. - Will use Phenergan prn. Works much better than Zofran.  - Make effort to eat small, more frequent meals.  - Stable today. Had more nausea yesterday.  7. DM: HgbA1c well-controlled No change. SSI while in house.  8. HTN: Continue Bidil on non HD days.   - Hypertension seems to be driven by nausea (see above), so will try to minimize nausea. No change.  9. PAD: Moderate bilateral disease on ABIs in 8/18. Denies claudication currently, no pedal. No change.  10. Atrial flutter: Paroxysmal. Remains in NSR.  - On coumadin for LVAD.   11. Anemia: Received 1 PRBCs 12/9.  Hgb 10 -> 8.5. No evidence of GI bleeding.  12. Altered mental status: Noted last night, appears to have completely recovered.  Suspect this may have been due to oversedation with multiple doses of phenergan and Ativan 08/22/18.  Head CT showed no acute changes.  - He is at mental baseline today.   I reviewed the LVAD parameters from today, and compared the results to the patient's prior recorded data.  No programming changes were made.  The LVAD is functioning within specified parameters.  The patient performs LVAD self-test daily.  LVAD interrogation was negative for any significant power changes, alarms or PI events/speed drops.   LVAD equipment check completed and is in good working order.  Back-up equipment present.   LVAD education done on emergency procedures and precautions and reviewed exit site care.   Length of Stay: 2  Annamaria Helling 08/24/2018, 9:11 AM  VAD Team --- VAD ISSUES ONLY--- Pager [phone] (7am - 7am)  Advanced Heart Failure Team  Pager [phone] (M-F; 7a - 4p)  Please contact [facility] for night-coverage after hours (4p -7a ) and weekends on [email or web address]  Patient seen with PA, agree with  the above note.   INR now therapeutic and LDH has trended back down towards baseline, 249 today.  May have been elevated due to hematoma left arm.   - Stop heparin gtt today.  - Continue warfarin and ASA.   He is still nauseated and not taking much po.  Wants frequency of anti-emetics increased.   VVS saw him yesterday. Pain in left upper arm is from hematoma, this is gradually improving.  Weak grip and tingling in left hand is either from neuropathy from hematoma or steal syndrome from LUE graft.  VVS recommend watch him over the next couple of days to determine if graft needs to be ligated (for steal syndrome).    At this point, he can go home when he is able to take pos/nausea controlled and VVS is satisfied that graft does not need to be ligated.   Francesco Provencal 08/24/2018 10:07 AM

## 2018-08-24 NOTE — Plan of Care (Signed)
  Problem: Health Behavior/Discharge Planning: Goal: Ability to manage health-related needs will improve Outcome: Progressing   Problem: Clinical Measurements: Goal: Ability to maintain clinical measurements within normal limits will improve Outcome: Progressing Goal: Will remain free from infection Outcome: Progressing Goal: Diagnostic test results will improve Outcome: Progressing Goal: Cardiovascular complication will be avoided Outcome: Progressing   Problem: Activity: Goal: Risk for activity intolerance will decrease Outcome: Progressing   Problem: Nutrition: Goal: Adequate nutrition will be maintained Outcome: Progressing   Problem: Coping: Goal: Level of anxiety will decrease Outcome: Progressing   Problem: Elimination: Goal: Will not experience complications related to bowel motility Outcome: Progressing   Problem: Pain Managment: Goal: General experience of comfort will improve Outcome: Progressing   Problem: Safety: Goal: Ability to remain free from injury will improve Outcome: Progressing   Problem: Cardiac: Goal: LVAD will function as expected and patient will experience no clinical alarms Outcome: Progressing   Problem: Education: Goal: Patient will be able to verbalize current INR target range and antiplatelet therapy for discharge home Outcome: Progressing

## 2018-08-24 NOTE — Plan of Care (Signed)
  Problem: Health Behavior/Discharge Planning: Goal: Ability to manage health-related needs will improve Outcome: Progressing   Problem: Clinical Measurements: Goal: Ability to maintain clinical measurements within normal limits will improve Outcome: Progressing Goal: Will remain free from infection Outcome: Progressing Goal: Diagnostic test results will improve Outcome: Progressing Goal: Cardiovascular complication will be avoided Outcome: Progressing   Problem: Activity: Goal: Risk for activity intolerance will decrease Outcome: Progressing   Problem: Coping: Goal: Level of anxiety will decrease Outcome: Progressing   Problem: Elimination: Goal: Will not experience complications related to bowel motility Outcome: Progressing   Problem: Pain Managment: Goal: General experience of comfort will improve Outcome: Progressing   Problem: Safety: Goal: Ability to remain free from injury will improve Outcome: Progressing

## 2018-08-25 DIAGNOSIS — I5022 Chronic systolic (congestive) heart failure: Secondary | ICD-10-CM

## 2018-08-25 LAB — BASIC METABOLIC PANEL
Anion gap: 15 (ref 5–15)
BUN: 17 mg/dL (ref 6–20)
CALCIUM: 8.4 mg/dL — AB (ref 8.9–10.3)
CO2: 25 mmol/L (ref 22–32)
Chloride: 96 mmol/L — ABNORMAL LOW (ref 98–111)
Creatinine, Ser: 6.23 mg/dL — ABNORMAL HIGH (ref 0.61–1.24)
GFR calc Af Amer: 11 mL/min — ABNORMAL LOW (ref >60)
GFR calc non Af Amer: 9 mL/min — ABNORMAL LOW (ref >60)
Glucose, Bld: 107 mg/dL — ABNORMAL HIGH (ref 70–99)
Potassium: 3.6 mmol/L (ref 3.5–5.1)
SODIUM: 136 mmol/L (ref 135–145)

## 2018-08-25 LAB — LACTATE DEHYDROGENASE: LDH: 272 U/L — ABNORMAL HIGH (ref 98–192)

## 2018-08-25 LAB — CBC
HCT: 29.1 % — ABNORMAL LOW (ref 39.0–52.0)
Hemoglobin: 9.3 g/dL — ABNORMAL LOW (ref 13.0–17.0)
MCH: 29.5 pg (ref 26.0–34.0)
MCHC: 32 g/dL (ref 30.0–36.0)
MCV: 92.4 fL (ref 80.0–100.0)
Platelets: 374 10*3/uL (ref 150–400)
RBC: 3.15 MIL/uL — ABNORMAL LOW (ref 4.22–5.81)
RDW: 16 % — ABNORMAL HIGH (ref 11.5–15.5)
WBC: 12.6 10*3/uL — ABNORMAL HIGH (ref 4.0–10.5)
nRBC: 1.6 % — ABNORMAL HIGH (ref 0.0–0.2)

## 2018-08-25 LAB — GLUCOSE, CAPILLARY
Glucose-Capillary: 117 mg/dL — ABNORMAL HIGH (ref 70–99)
Glucose-Capillary: 105 mg/dL — ABNORMAL HIGH (ref 70–99)
Glucose-Capillary: 152 mg/dL — ABNORMAL HIGH (ref 70–99)
Glucose-Capillary: 125 mg/dL — ABNORMAL HIGH (ref 70–99)
Glucose-Capillary: 109 mg/dL — ABNORMAL HIGH (ref 70–99)

## 2018-08-25 LAB — PROTIME-INR
INR: 3
Prothrombin Time: 30.7 seconds — ABNORMAL HIGH (ref 11.4–15.2)

## 2018-08-25 MED ORDER — SORBITOL 70 % SOLN
30.0000 mL | Status: AC
  Filled 2018-08-25: qty 30

## 2018-08-25 MED ORDER — PROMETHAZINE HCL 25 MG/ML IJ SOLN
25.0000 mg | Freq: Four times a day (QID) | INTRAMUSCULAR | Status: DC | PRN
  Administered 2018-08-25: 25 mg via INTRAVENOUS

## 2018-08-25 MED ORDER — CHLORHEXIDINE GLUCONATE CLOTH 2 % EX PADS: 6.0000 | MEDICATED_PAD | Freq: Every day | CUTANEOUS | Status: DC

## 2018-08-25 MED ORDER — WARFARIN SODIUM 2.5 MG PO TABS
2.5000 mg | ORAL_TABLET | Freq: Once | ORAL | Status: AC
  Administered 2018-08-25: 2.5 mg via ORAL
  Filled 2018-08-25: qty 1

## 2018-08-25 MED ORDER — SORBITOL 70 % PO SOLN
30.0000 mL | ORAL | Status: DC
  Administered 2018-08-25: 30 mL via ORAL
  Filled 2018-08-25 (×2): qty 30

## 2018-08-25 NOTE — Progress Notes (Addendum)
[facility] Progress Note  Subjective: on HD no c/o's.   Vitals:   08/25/18 0000 08/25/18 0542 08/25/18 0803 08/25/18 1000  BP: 93/78 96/83 (!) 110/96 (!) 110/96  Pulse: (!) 29 86 80   Resp: (!) 0  (!) 22   Temp: 98.6 F (37 C) (!) 97.5 F (36.4 C) 98 F (36.7 C)   TempSrc: Oral Oral Oral   SpO2: 98% 98% 99%   Weight:  80.5 kg    Height:        Inpatient medications: . amiodarone  200 mg Oral Daily  . aspirin EC  81 mg Oral Daily  . atorvastatin  40 mg Oral q1800  . calcitRIOL  0.5 mcg Oral Q M,W,F  . citalopram  20 mg Oral Daily  . dronabinol  2.5 mg Oral BID AC  . erythromycin  250 mg Oral TID  . insulin aspart  0-5 Units Subcutaneous QHS  . insulin aspart  0-9 Units Subcutaneous TID WC  . insulin glargine  10 Units Subcutaneous Daily  . isosorbide-hydrALAZINE  1.5 tablet Oral 3 times per day on Sun Tue Thu Sat  . metoCLOPramide  10 mg Oral TID  . multivitamin  1 tablet Oral QHS  . pantoprazole  40 mg Oral BID  . sevelamer carbonate  4,000 mg Oral TID WC  . warfarin  2.5 mg Oral ONCE-1800  . Warfarin - Pharmacist Dosing Inpatient   Does not apply q1800    acetaminophen, docusate sodium, hydrALAZINE, lidocaine-prilocaine, LORazepam, LORazepam, ondansetron (ZOFRAN) IV, promethazine, promethazine, traMADol  Iron/TIBC/Ferritin/ %Sat    Component Value Date/Time   IRON 40 (L) 11/02/2017 0016   TIBC 188 (L) 11/02/2017 0016   FERRITIN 523 (H) 11/02/2017 0016   IRONPCTSAT 21 11/02/2017 0016   Exam: General: Well appearing man, NAD Heart: Continuous LAD hum Lungs: CTA anteriorly Extremities: No LE edema Dialysis Access: TDC in chest, new LUE AVG + arm edema/ecchymosis    Dialysis Orders: MWF [facility] 4h 83mn 400/800 80.5kg 2K/3Ca bath Hep none TDC/ new LUA AVG (12/5, maturing) - Calcitriol 130m PO q HD - ESA mircera 100 ug q 2 wks, last 12/16  Assessment/Plan: 1. Elevated LDH/ sub Rx INR: INR now therapeutic, per cards 2. Nausea/  vomiting: recurrent issue felt to be gastroparesis related 3. SP new LUE AV graft: on 12/5 by Dr. CaDonzetta Mattersw/ postop LUE edema, possibly hematoma formation.  AVG's for LVAD pt's may be functional without a normal bruit/ thrill, this is due to the non-pulsatile flow of the LVAD.   4. ESRD: HD MWF via TDC.  Plan HD tomorrow due to the Holiday schedule.  5. Hypertension/volume:BP controlled, no edema on exam  6. Anemia:Hgb 8.5, had darbe 100 ug on 12/16, next dose due 12/30 7. Metabolic bone disease:cont Renvela and Calcitriol 8. T2DM: Per primary 9. CAD/severe ICM with Heartmate 3 LVAD/ HTN: Per HF team. [facility] pager [phone] 08/25/2018, 10:45 AM   Recent Labs  Lab 08/22/18 1122  08/24/18 0333 08/25/18 0621  NA 134*   < > 135 136  K 4.4   < > 4.4 3.6  CL 97*   < > 98 96*  CO2 22   < > 23 25  GLUCOSE 107*   < > 61* 107*  BUN 16   < > 39* 17  CREATININE 4.20*   < > 8.42* 6.23*  CALCIUM 9.4   < > 8.6* 8.4*  ALBUMIN  3.1*  --   --   --   INR 1.70   < > 2.70 3.00   < > = values in this interval not displayed.   Recent Labs  Lab 08/22/18 1122  AST 45*  ALT 19  ALKPHOS 116  BILITOT 1.1  PROT 7.6   Recent Labs  Lab 08/22/18 1122  08/24/18 0333 08/25/18 0621  WBC 15.9*   < > 16.8* 12.6*  NEUTROABS 13.8*  --   --   --   HGB 9.7*   < > 8.5* 9.3*  HCT 31.1*   < > 27.2* 29.1*  MCV 89.9   < > 89.5 92.4  PLT 315   < > 338 374   < > = values in this interval not displayed.

## 2018-08-25 NOTE — Progress Notes (Addendum)
ANTICOAGULATION CONSULT NOTE - [name] for warfarin + heparin Indication: LVAD/Factor 5 Leiden  Allergies  Allergen Reactions  . Metformin And Related Diarrhea    Patient Measurements: Height: 5' 7"  (170.2 cm) Weight: 177 lb 7.5 oz (80.5 kg) IBW/kg (Calculated) : 66.1 Heparin Dosing Weight: 81kg  Vital Signs: Temp: 98 F (36.7 C) (12/21 0803) Temp Source: Oral (12/21 0803) BP: 110/96 (12/21 0803) Pulse Rate: 80 (12/21 0803)  Labs: Recent Labs    08/22/18 1122  08/23/18 0245 08/23/18 0531 08/23/18 1206 08/24/18 0333 08/25/18 0621  HGB 9.7*  --  10.0*  --   --  8.5* 9.3*  HCT 31.1*  --  30.9*  --   --  27.2* 29.1*  PLT 315  --  326  --   --  338 374  LABPROT 19.7*  --   --  21.5*  --  28.3* 30.7*  INR 1.70  --   --  1.90  --  2.70 3.00  HEPARINUNFRC  --    < >  --  0.24* 0.61 1.01*  --   CREATININE 4.20*  --  6.01*  --   --  8.42*  --    < > = values in this interval not displayed.    Estimated Creatinine Clearance: 10.3 mL/min (A) (by C-G formula based on SCr of 8.42 mg/dL (H)).   Medical History: Past Medical History:  Diagnosis Date  . Angina   . ASCVD (arteriosclerotic cardiovascular disease)    , Anterior infarction 2005, LAD diagonal bifurcation intervention 03/2004  . Automatic implantable cardiac defibrillator -St. Jude's       . Benign neoplasm of colon   . CHF (congestive heart failure) ([place])   . Chronic systolic heart failure ([place])   . Coronary artery disease     Widely patent previously placed stents in the left anterior   . Crohn's disease ([place])   . 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 Dialysis patient ([place])   . Dyspnea   . Gastroparesis   . GERD (gastroesophageal reflux disease)   . High cholesterol   . Hyperlipidemia   . Hypersomnolent    Previous diagnosis of narcolepsy  . Hypertension, essential   . Ischemic cardiomyopathy    Ejection fraction 15-20% catheterization 2010  . Type II  or unspecified type diabetes mellitus without mention of complication, not stated as uncontrolled   . Unspecified gastritis and gastroduodenitis without mention of hemorrhage     Assessment: 78 yoM on warfarin PTA for LVAD. Pt admitted for nausea/vomiting during HD. INR on admission 1.70 despite being 2.5 in clinic yesterday and LDH was up 400. Per MD, will continue warfarin and start IV heparin bridge given elevated LDH in setting of subtherapeutic INR.  Heparin stopped yesterday. INR has continued to trend up to 3.0. No  events noted overnight. With limited po intake and INR trend will give reduced dose warfarin tonight.   LDH 272 . Hgb stable 9s  *PTA Warfarin dose = 51m daily except 7.5861mMonday  Goal of Therapy:  INR 2-3 Monitor platelets by anticoagulation protocol: Yes   Plan:  Eating little with N/V - will carefully dose warfarin  -Warfarin 2.61m2mO daily  -Daily PT/INR, CBC, LDH  FraErin HearingarmD., BCPS Clinical Pharmacist 08/25/2018 8:10 AM

## 2018-08-25 NOTE — Progress Notes (Signed)
Patient ID: [name], male   DOB: 02/24/1966, 52 y.o.   MRN: [id number]    Advanced Heart Failure VAD Team Note  PCP-Cardiologist: No primary care provider on file.   Subjective:    Patient sent to ER from HD with recurrent nausea, he was kept in the hospital due to to elevated LDH and low INR.    08/22/18 he developed confusion/altered mental status and was difficult to awaken.  Head CT was done, no acute changes (old small left cerebellar infarction).  The following morning, he was clear and at his baseline.   Yesterday had low-grade fever. Now resolved. Ross remain negative. Continues with severe nausea and poor appetite. Asking to increase phenergan. LUE still sore but improving.   LDH 403 => 368 => 249 => 272  INR 2.7 -> 3.0  LVAD Interrogation HM 3: Speed: 5500 Flow: 3.8 PI: 3.4 Power: 4.0. Personally reviewed.   Objective:    Vital Signs:   Temp:  [97.5 F (36.4 C)-100 F (37.8 C)] 98 F (36.7 C) (12/21 0803) Pulse Rate:  [29-114] 80 (12/21 0803) Resp:  [0-22] 22 (12/21 0803) BP: (76-149)/(59-98) 110/96 (12/21 1000) SpO2:  [96 %-99 %] 99 % (12/21 0803) Weight:  [80.1 kg-80.5 kg] 80.5 kg (12/21 0542) Last BM Date: 08/22/18 Mean arterial Pressure 80s  Intake/Output:   Intake/Output Summary (Last 24 hours) at 08/25/2018 1148 Last data filed at 08/25/2018 0800 Gross per 24 hour  Intake 480 ml  Output 1000 ml  Net -520 ml     Physical Exam    General: Lying in bed. Fatigued appearing HEENT: Normal. Neck: Supple, JVP 7-8 cm. Carotids OK.  Cardiac:  Mechanical heart sounds with LVAD hum present.  + Perm cath - site ok Lungs:  CTAB, normal effort.  Abdomen:  NT, ND, no HSM. No bruits or masses. +BS  LVAD exit site: Well-healed and incorporated. Dressing dry and intact. No erythema or drainage. Stabilization device present and accurately applied. Driveline dressing changed daily per sterile technique. Extremities:  Warm and dry. No cyanosis, clubbing, rash.  LUE swollen with hematoma. AVF nonfunctional Neuro:  Alert & oriented x 3. Cranial nerves grossly intact. Moves all 4 extremities w/o difficulty. Affect pleasant     Telemetry   NSR 80s, personally reviewed.   Labs   Basic Metabolic Panel: Recent Labs  Lab 08/22/18 1122 08/23/18 0245 08/24/18 0333 08/25/18 0621  NA 134* 133* 135 136  K 4.4 4.9 4.4 3.6  CL 97* 97* 98 96*  CO2 22 24 23 25   GLUCOSE 107* 123* 61* 107*  BUN 16 27* 39* 17  CREATININE 4.20* 6.01* 8.42* 6.23*  CALCIUM 9.4 9.0 8.6* 8.4*    Liver Function Tests: Recent Labs  Lab 08/22/18 1122  AST 45*  ALT 19  ALKPHOS 116  BILITOT 1.1  PROT 7.6  ALBUMIN 3.1*   No results for input(s): LIPASE, AMYLASE in the last 168 hours. No results for input(s): AMMONIA in the last 168 hours.  CBC: Recent Labs  Lab 08/22/18 1122 08/23/18 0245 08/24/18 0333 08/25/18 0621  WBC 15.9* 15.4* 16.8* 12.6*  NEUTROABS 13.8*  --   --   --   HGB 9.7* 10.0* 8.5* 9.3*  HCT 31.1* 30.9* 27.2* 29.1*  MCV 89.9 90.4 89.5 92.4  PLT 315 326 338 374    INR: Recent Labs  Lab 08/21/18 08/22/18 1122 08/23/18 0531 08/24/18 0333 08/25/18 0621  INR 2.5 1.70 1.90 2.70 3.00    Other results:  Imaging   No results found.   Medications:     Scheduled Medications: . amiodarone  200 mg Oral Daily  . aspirin EC  81 mg Oral Daily  . atorvastatin  40 mg Oral q1800  . calcitRIOL  0.5 mcg Oral Q M,W,F  . citalopram  20 mg Oral Daily  . dronabinol  2.5 mg Oral BID AC  . erythromycin  250 mg Oral TID  . insulin aspart  0-5 Units Subcutaneous QHS  . insulin aspart  0-9 Units Subcutaneous TID WC  . insulin glargine  10 Units Subcutaneous Daily  . isosorbide-hydrALAZINE  1.5 tablet Oral 3 times per day on Sun Tue Thu Sat  . metoCLOPramide  10 mg Oral TID  . multivitamin  1 tablet Oral QHS  . pantoprazole  40 mg Oral BID  . sevelamer carbonate  4,000 mg Oral TID WC  . warfarin  2.5 mg Oral ONCE-1800  . Warfarin -  Pharmacist Dosing Inpatient   Does not apply q1800    Infusions:   PRN Medications: acetaminophen, docusate sodium, hydrALAZINE, lidocaine-prilocaine, LORazepam, LORazepam, ondansetron (ZOFRAN) IV, promethazine, promethazine, traMADol   Patient Profile   [name] is a 52 y.o. malewith history of CAD s/p anterior MI in 2010 and NSTEMI in 3/18 with DES to pRCA and mLAD, diabetic, gastroparesis, Chrons disease, DMIIand ischemic cardiomyopathy, 10/2018s/p Heartmate 3 LVAD(emergent exchange 02/2018 due to outflow graft kink)and ESRD(HD MWF).  Assessment/Plan:    1. Elevated LDH and subtherapeutic INR: Elevated LDH may be from large resolving hematoma in left arm from AV graft surgery.  However, with history of hypercoagulability and prior pump thrombosis, I admitted him for heparin gtt while INR therapeutic.  LDH now back down to 272.  - SINR 3.00. Off heparin. Continue coumadin. Discussed dosing with PharmD personally. 2. Chronic systolic CHF: Ischemic cardiomyopathy. St Jude ICD. NSTEMI in March 2018 with DES to LAD and RCA, complicated by cardiogenic shock, low output requiring milrinone. Echo 8/18 with EF 20-25%, moderate MR. CPX (8/18) with severe functional impairment due to HF. He is s/p HM3 LVAD placement. He had pump thrombosis 6/19 due to [facility] outflow graft kinking. He had pump exchange for another HM3.RHC showed low filling pressures and good cardiac output 08/09/18.  We requested that renal increase his dry weight based on RHC.  - Continue Bidil 1 tabTIDon non-HD days.  - INR 3.0. Goal INR 2-2.5 - Continue ASA 81.  - He is now listed for heart/kidney at [facility]. [place] 08/09/18 with optimized filling pressures and cardiac output. Normal PA pressure.  3. ESRD: Developed peri-operatively after HM3 placement. He is tolerating HD, currently via catheter but had LUE graft placed last admission. Hematoma post-AV LUE graft placement. VVS saw, suspects hematoma. The graft  is not functional.  - Still with left arm heamtoma. VVS following. Continue pain management and elevation.   - MWF HD, dry weight has been increased based on RHC.  4. CAD: NSTEMI in 3/18. LHC with 99% ulcerated lesion proximal RCA with left to right collaterals, 95% mid LAD stenosis after mid LAD stent. s/p PCI to RCA and LAD on 11/25/16.  - No s/s of ischemia.    - Continue statin. 5. h/o DVTs: Factor V Leiden heterozygote.  6. Diabetic gastroparesis: Difficult to manage. He has seen gastroparesis specialist at [facility]. Surprisingly, gastric emptying study was normal. However, electrogastrogram showed poor gastric accomodation/capacity. Therefore, small frequent meals recommended. Also morning nausea thought to be GERD and Protonix was increased.  Nausea seems to drive markedly high BP (resolves with nausea control). He was sent to ER from HD 12/18 with recurrent nausea, slowly improving.    - Continue reglan 5 mg tid/ac, chronic erythromycin, and Marinol.  - Continue Protonix BID. - Continue Phenergan prn. Works much better than Zofran. Will increase to 25 q6hr - Make effort to eat small, more frequent meals.  - No BM since Wednesday. Will give sorbitol 7. DM: HgbA1c well-controlled No change. SSI while in house.  8. HTN: Continue Bidil on non HD days.   - Hypertension seems to be driven by nausea (see above), so will try to minimize nausea. No change.  9. PAD: Moderate bilateral disease on ABIs in 8/18. Denies claudication currently, no pedal. No change.  10. Atrial flutter: Paroxysmal. Remains in NSR.  - On coumadin for LVAD.   11. Anemia: Received 1 PRBCs 12/9.  Hgb 10 -> 8.5. No evidence of GI bleeding.  12. Altered mental status: Noted on admit, appears to have completely recovered.  Suspect this may have been due to oversedation with multiple doses of phenergan and Ativan 08/22/18.  Head CT showed no acute changes.  - He is at mental baseline today.  13. Fever - had low  grade temp on 12/20 - now resolved.  - BCX NGTD - WBC 16.8 -> 12.6  I reviewed the LVAD parameters from today, and compared the results to the patient's prior recorded data.  No programming changes were made.  The LVAD is functioning within specified parameters.  The patient performs LVAD self-test daily.  LVAD interrogation was negative for any significant power changes, alarms or PI events/speed drops.  LVAD equipment check completed and is in good working order.  Back-up equipment present.   LVAD education done on emergency procedures and precautions and reviewed exit site care.   Length of Stay: 3  [name], MD 08/25/2018, 11:48 AM  VAD Team --- VAD ISSUES ONLY--- Pager [phone] (7am - 7am)  Advanced Heart Failure Team  Pager [phone] (M-F; 7a - 4p)  Please contact [facility] for night-coverage after hours (4p -7a ) and weekends on [email or web address]

## 2018-08-26 LAB — PROTIME-INR
INR: 3.36
Prothrombin Time: 33.5 seconds — ABNORMAL HIGH (ref 11.4–15.2)

## 2018-08-26 LAB — CBC
HCT: 28.2 % — ABNORMAL LOW (ref 39.0–52.0)
Hemoglobin: 8.9 g/dL — ABNORMAL LOW (ref 13.0–17.0)
MCH: 29.4 pg (ref 26.0–34.0)
MCHC: 31.6 g/dL (ref 30.0–36.0)
MCV: 93.1 fL (ref 80.0–100.0)
NRBC: 1.9 % — AB (ref 0.0–0.2)
Platelets: 359 10*3/uL (ref 150–400)
RBC: 3.03 MIL/uL — AB (ref 4.22–5.81)
RDW: 16.6 % — ABNORMAL HIGH (ref 11.5–15.5)
WBC: 15.3 10*3/uL — ABNORMAL HIGH (ref 4.0–10.5)

## 2018-08-26 LAB — GLUCOSE, CAPILLARY
GLUCOSE-CAPILLARY: 190 mg/dL — AB (ref 70–99)
Glucose-Capillary: 143 mg/dL — ABNORMAL HIGH (ref 70–99)
Glucose-Capillary: 246 mg/dL — ABNORMAL HIGH (ref 70–99)

## 2018-08-26 LAB — BASIC METABOLIC PANEL
ANION GAP: 15 (ref 5–15)
BUN: 24 mg/dL — ABNORMAL HIGH (ref 6–20)
CHLORIDE: 100 mmol/L (ref 98–111)
CO2: 22 mmol/L (ref 22–32)
Calcium: 8.3 mg/dL — ABNORMAL LOW (ref 8.9–10.3)
Creatinine, Ser: 8.39 mg/dL — ABNORMAL HIGH (ref 0.61–1.24)
GFR calc Af Amer: 8 mL/min — ABNORMAL LOW (ref >60)
GFR calc non Af Amer: 7 mL/min — ABNORMAL LOW (ref >60)
Glucose, Bld: 79 mg/dL (ref 70–99)
POTASSIUM: 3.9 mmol/L (ref 3.5–5.1)
Sodium: 137 mmol/L (ref 135–145)

## 2018-08-26 LAB — LACTATE DEHYDROGENASE: LDH: 287 U/L — ABNORMAL HIGH (ref 98–192)

## 2018-08-26 MED ORDER — SODIUM CHLORIDE 0.9 % IV SOLN: 100.0000 mL | INTRAVENOUS | Status: DC | PRN

## 2018-08-26 MED ORDER — LIDOCAINE HCL (PF) 1 % IJ SOLN: 5.0000 mL | INTRAMUSCULAR | Status: DC | PRN

## 2018-08-26 MED ORDER — HEPARIN SODIUM (PORCINE) 1000 UNIT/ML IJ SOLN
3.4000 mL | Freq: Once | INTRAMUSCULAR | Status: DC
  Administered 2018-08-26: 3400 [IU] via INTRAVENOUS

## 2018-08-26 MED ORDER — ALTEPLASE 2 MG IJ SOLR: 2.0000 mg | Freq: Once | INTRAMUSCULAR | Status: DC | PRN

## 2018-08-26 MED ORDER — HEPARIN SODIUM (PORCINE) 1000 UNIT/ML DIALYSIS: 1000.0000 [IU] | INTRAMUSCULAR | Status: DC | PRN

## 2018-08-26 MED ORDER — PENTAFLUOROPROP-TETRAFLUOROETH EX AERO: 1.0000 "application " | INHALATION_SPRAY | CUTANEOUS | Status: DC | PRN

## 2018-08-26 MED ORDER — LIDOCAINE-PRILOCAINE 2.5-2.5 % EX CREA: 1.0000 "application " | TOPICAL_CREAM | CUTANEOUS | Status: DC | PRN

## 2018-08-26 MED ORDER — HEPARIN SODIUM (PORCINE) 1000 UNIT/ML IJ SOLN
INTRAMUSCULAR | Status: AC
  Filled 2018-08-26: qty 4

## 2018-08-26 NOTE — Progress Notes (Signed)
ANTICOAGULATION CONSULT NOTE - [place] for warfarin Indication: LVAD/Factor 5 Leiden  Allergies  Allergen Reactions  . Metformin And Related Diarrhea    Patient Measurements: Height: 5' 7"  (170.2 cm) Weight: 177 lb 14.6 oz (80.7 kg) IBW/kg (Calculated) : 66.1 Heparin Dosing Weight: 81kg  Vital Signs: Temp: 98.2 F (36.8 C) (12/22 0635) Temp Source: Oral (12/22 0635) BP: 91/64 (12/22 0730) Pulse Rate: 73 (12/22 0730)  Labs: Recent Labs    08/23/18 1206  08/24/18 0333 08/25/18 0621 08/26/18 0301  HGB  --    < > 8.5* 9.3* 8.9*  HCT  --   --  27.2* 29.1* 28.2*  PLT  --   --  338 374 359  LABPROT  --   --  28.3* 30.7* 33.5*  INR  --   --  2.70 3.00 3.36  HEPARINUNFRC 0.61  --  1.01*  --   --   CREATININE  --   --  8.42* 6.23* 8.39*   < > = values in this interval not displayed.    Estimated Creatinine Clearance: 10.4 mL/min (A) (by C-G formula based on SCr of 8.39 mg/dL (H)).   Medical History: Past Medical History:  Diagnosis Date  . Angina   . ASCVD (arteriosclerotic cardiovascular disease)    , Anterior infarction 2005, LAD diagonal bifurcation intervention 03/2004  . Automatic implantable cardiac defibrillator -St. Jude's       . Benign neoplasm of colon   . CHF (congestive heart failure) ([place])   . Chronic systolic heart failure ([place])   . Coronary artery disease     Widely patent previously placed stents in the left anterior   . Crohn's disease ([place])   . 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 Dialysis patient ([place])   . Dyspnea   . Gastroparesis   . GERD (gastroesophageal reflux disease)   . High cholesterol   . Hyperlipidemia   . Hypersomnolent    Previous diagnosis of narcolepsy  . Hypertension, essential   . Ischemic cardiomyopathy    Ejection fraction 15-20% catheterization 2010  . Type II or unspecified type diabetes mellitus without mention of complication, not stated as uncontrolled   .  Unspecified gastritis and gastroduodenitis without mention of hemorrhage     Assessment: [name] on warfarin PTA for LVAD. Pt admitted for nausea/vomiting during HD. INR on admission 1.70 despite being 2.5 in clinic yesterday and LDH was up 400. Per MD, will continue warfarin and start IV heparin bridge given elevated LDH in setting of subtherapeutic INR.  Heparin stopped 12/20. INR has continued to trend up to 3.3 after low dose given last night, with trend significantly over 3 will hold dose tonight. No events noted overnight.   LDH trending back down to 287. Hgb stable 8.9. HD today.   *PTA Warfarin dose = 57m daily except 7.565mMonday  Goal of Therapy:  INR 2-3 Monitor platelets by anticoagulation protocol: Yes   Plan:  Looks to be very little po intake -No warfarin today -Daily PT/INR, CBC, LDH  FrErin HearingharmD., BCPS Clinical Pharmacist 08/26/2018 7:53 AM

## 2018-08-26 NOTE — Progress Notes (Signed)
[facility] Progress Note  Subjective: on HD no c/o's. Nausea a little better.   Vitals:   08/26/18 0730 08/26/18 0800 08/26/18 0830 08/26/18 0900  BP: 91/64 103/72 109/75 101/85  Pulse: 73 75 82 81  Resp: 18 16 15 17   Temp:      TempSrc:      SpO2:      Weight:      Height:        Inpatient medications: . amiodarone  200 mg Oral Daily  . aspirin EC  81 mg Oral Daily  . atorvastatin  40 mg Oral q1800  . calcitRIOL  0.5 mcg Oral Q M,W,F  . Chlorhexidine Gluconate Cloth  6 each Topical Q0600  . citalopram  20 mg Oral Daily  . dronabinol  2.5 mg Oral BID AC  . erythromycin  250 mg Oral TID  . insulin aspart  0-5 Units Subcutaneous QHS  . insulin aspart  0-9 Units Subcutaneous TID WC  . insulin glargine  10 Units Subcutaneous Daily  . isosorbide-hydrALAZINE  1.5 tablet Oral 3 times per day on Sun Tue Thu Sat  . metoCLOPramide  10 mg Oral TID  . multivitamin  1 tablet Oral QHS  . pantoprazole  40 mg Oral BID  . sevelamer carbonate  4,000 mg Oral TID WC  . sorbitol  30 mL Oral NOW  . Warfarin - Pharmacist Dosing Inpatient   Does not apply q1800    acetaminophen, docusate sodium, hydrALAZINE, lidocaine-prilocaine, LORazepam, LORazepam, ondansetron (ZOFRAN) IV, promethazine, promethazine, traMADol  Iron/TIBC/Ferritin/ %Sat    Component Value Date/Time   IRON 40 (L) 11/02/2017 0016   TIBC 188 (L) 11/02/2017 0016   FERRITIN 523 (H) 11/02/2017 0016   IRONPCTSAT 21 11/02/2017 0016   Exam: General: Well appearing man, NAD Heart: Continuous LAD hum Lungs: CTA anteriorly Extremities: No LE edema Dialysis Access: TDC in chest, new LUE AVG + arm edema/ecchymosis   Dialysis Orders: MWF [facility] 4h 41mn 400/800 80.5kg 2K/3Ca bath Hep none TDC/ new LUA AVG (12/5, maturing) - Calcitriol 125m PO q HD - ESA mircera 100 ug q 2 wks, last 12/16  Assessment/Plan: 1. Elevated LDH/ sub Rx INR: INR now therapeutic, per cards 2. Nausea/ vomiting: recurrent issue,  better today 3. SP new LUE AV graft: on 12/5 by Dr. CaDonzetta Mattersw/ postop edema and some steal symptoms. VVS following.  4. ESRD: HD MWF via [facility] Plan HD today on holiday schedule.  5. Hypertension/volume:BP controlled, no vol excess, asked to ^dBahamast after RHC date per cardiology. Have ^'d 0.5kg since then, will go up ^ another 0.5kg now to 81kg edw.  6. Anemia:Hgb 8.5, had darbe 100 ug on 12/16, next dose due 12/30 7. Metabolic bone disease:cont Renvela and Calcitriol 8. T2DM: Per primary 9. CAD/severe ICM with Heartmate 3 LVAD/ HTN: Per HF team. [facility] pager [phone] 08/26/2018, 9:48 AM   Recent Labs  Lab 08/22/18 1122  08/25/18 0621 08/26/18 0301  NA 134*   < > 136 137  K 4.4   < > 3.6 3.9  CL 97*   < > 96* 100  CO2 22   < > 25 22  GLUCOSE 107*   < > 107* 79  BUN 16   < > 17 24*  CREATININE 4.20*   < > 6.23* 8.39*  CALCIUM 9.4   < > 8.4* 8.3*  ALBUMIN 3.1*  --   --   --  INR 1.70   < > 3.00 3.36   < > = values in this interval not displayed.   Recent Labs  Lab 08/22/18 1122  AST 45*  ALT 19  ALKPHOS 116  BILITOT 1.1  PROT 7.6   Recent Labs  Lab 08/22/18 1122  08/25/18 0621 08/26/18 0301  WBC 15.9*   < > 12.6* 15.3*  NEUTROABS 13.8*  --   --   --   HGB 9.7*   < > 9.3* 8.9*  HCT 31.1*   < > 29.1* 28.2*  MCV 89.9   < > 92.4 93.1  PLT 315   < > 374 359   < > = values in this interval not displayed.

## 2018-08-26 NOTE — Progress Notes (Signed)
[facility]  Subjective  - States left hand about the same.  Some numbness in left 1st and 2nd digit worse than other digits.   Objective 91/64 73 98.2 F (36.8 C) (Oral) 18 99%  Intake/Output Summary (Last 24 hours) at 08/26/2018 0854 Last data filed at 08/26/2018 0500 Gross per 24 hour  Intake 480 ml  Output -  Net 480 ml    Doppler signal in graft Doppler signal in left radial and ulnar artery  Laboratory Lab Results: Recent Labs    08/25/18 0621 08/26/18 0301  WBC 12.6* 15.3*  HGB 9.3* 8.9*  HCT 29.1* 28.2*  PLT 374 359   BMET Recent Labs    08/25/18 0621 08/26/18 0301  NA 136 137  K 3.6 3.9  CL 96* 100  CO2 25 22  GLUCOSE 107* 79  BUN 17 24*  CREATININE 6.23* 8.39*  CALCIUM 8.4* 8.3*    COAG Lab Results  Component Value Date   INR 3.36 08/26/2018   INR 3.00 08/25/2018   INR 2.70 08/24/2018   No results found for: PTT  Assessment/Planning:  Continued numbness in left hand after recent graft placement.  States symptoms are stable.  Agreed continued observation with arm elevation for now.  Hopefully this improves as hematoma around graft improves.  If not, would require graft ligation.   Marty Heck 08/26/2018 8:54 AM --

## 2018-08-26 NOTE — Progress Notes (Signed)
Patient ID: [name], male   DOB: 1966-03-01, 52 y.o.   MRN: [id number]    Advanced Heart Failure VAD Team Note  PCP-Cardiologist: No primary care provider on file.   Subjective:    Had HD this am. Tolerated well. I increased Phenergan yesterday and nausea improved. Able to eat small meal today.   Breathing ok. No orthopnea or PND. LUE pain improving but still with some weakness in his had   LVAD Interrogation HM 3: Speed: 5500 Flow: 4.2  PI: 3.5 Power: 4.4. VAD interrogated personally. Parameters stable.   Objective:    Vital Signs:   Temp:  [98.2 F (36.8 C)-99.2 F (37.3 C)] 99 F (37.2 C) (12/22 1050) Pulse Rate:  [69-106] 75 (12/22 1050) Resp:  [15-26] 18 (12/22 1050) BP: (91-142)/(60-101) 100/76 (12/22 1141) SpO2:  [89 %-99 %] 99 % (12/22 1050) Weight:  [80.7 kg-80.8 kg] 80.7 kg (12/22 1050) Last BM Date: 08/22/18 Mean arterial Pressure 70-80s  Intake/Output:   Intake/Output Summary (Last 24 hours) at 08/26/2018 1246 Last data filed at 08/26/2018 1050 Gross per 24 hour  Intake 480 ml  Output 0 ml  Net 480 ml     Physical Exam    General:  NAD.  HEENT: normal  Neck: supple. JVP not elevated.  Carotids 2+ bilat; no bruits. No lymphadenopathy or thryomegaly appreciated. Cor: LVAD hum.  HD catheter ok  Lungs: Clear. Abdomen: soft, nontender, non-distended. No hepatosplenomegaly. No bruits or masses. Good bowel sounds. Driveline site clean. Anchor in place. Extremities:  Warm and dry. No cyanosis, clubbing, rash. LUE swollen with hematoma. AVF nonfunctional mild weakness in fingers in median nerve distribution Neuro: alert & oriented x 3, cranial nerves grossly intact. moves all 4 extremities w/o difficulty. Affect pleasant   Telemetry   NSR 70-80s, personally reviewed.   Labs   Basic Metabolic Panel: Recent Labs  Lab 08/22/18 1122 08/23/18 0245 08/24/18 0333 08/25/18 0621 08/26/18 0301  NA 134* 133* 135 136 137  K 4.4 4.9 4.4 3.6 3.9  CL  97* 97* 98 96* 100  CO2 22 24 23 25 22   GLUCOSE 107* 123* 61* 107* 79  BUN 16 27* 39* 17 24*  CREATININE 4.20* 6.01* 8.42* 6.23* 8.39*  CALCIUM 9.4 9.0 8.6* 8.4* 8.3*    Liver Function Tests: Recent Labs  Lab 08/22/18 1122  AST 45*  ALT 19  ALKPHOS 116  BILITOT 1.1  PROT 7.6  ALBUMIN 3.1*   No results for input(s): LIPASE, AMYLASE in the last 168 hours. No results for input(s): AMMONIA in the last 168 hours.  CBC: Recent Labs  Lab 08/22/18 1122 08/23/18 0245 08/24/18 0333 08/25/18 0621 08/26/18 0301  WBC 15.9* 15.4* 16.8* 12.6* 15.3*  NEUTROABS 13.8*  --   --   --   --   HGB 9.7* 10.0* 8.5* 9.3* 8.9*  HCT 31.1* 30.9* 27.2* 29.1* 28.2*  MCV 89.9 90.4 89.5 92.4 93.1  PLT 315 326 338 374 359    INR: Recent Labs  Lab 08/22/18 1122 08/23/18 0531 08/24/18 0333 08/25/18 0621 08/26/18 0301  INR 1.70 1.90 2.70 3.00 3.36    Other results:    Imaging   No results found.   Medications:     Scheduled Medications: . heparin      . amiodarone  200 mg Oral Daily  . aspirin EC  81 mg Oral Daily  . atorvastatin  40 mg Oral q1800  . calcitRIOL  0.5 mcg Oral Q M,W,F  . Chlorhexidine  Gluconate Cloth  6 each Topical [id number]  . citalopram  20 mg Oral Daily  . dronabinol  2.5 mg Oral BID AC  . erythromycin  250 mg Oral TID  . insulin aspart  0-5 Units Subcutaneous QHS  . insulin aspart  0-9 Units Subcutaneous TID WC  . insulin glargine  10 Units Subcutaneous Daily  . isosorbide-hydrALAZINE  1.5 tablet Oral 3 times per day on Sun Tue Thu Sat  . metoCLOPramide  10 mg Oral TID  . multivitamin  1 tablet Oral QHS  . pantoprazole  40 mg Oral BID  . sevelamer carbonate  4,000 mg Oral TID WC  . sorbitol  30 mL Oral NOW  . Warfarin - Pharmacist Dosing Inpatient   Does not apply q1800    Infusions:   PRN Medications: acetaminophen, docusate sodium, hydrALAZINE, lidocaine-prilocaine, LORazepam, LORazepam, ondansetron (ZOFRAN) IV, promethazine, promethazine,  traMADol   Patient Profile   [name] is a 52 y.o. malewith history of CAD s/p anterior MI in 2010 and NSTEMI in 3/18 with DES to pRCA and mLAD, diabetic, gastroparesis, Chrons disease, DMIIand ischemic cardiomyopathy, 10/2018s/p Heartmate 3 LVAD(emergent exchange 02/2018 due to outflow graft kink)and ESRD(HD MWF).  Assessment/Plan:    1. Elevated LDH and subtherapeutic INR: Elevated LDH may be from large resolving hematoma in left arm from AV graft surgery.  However, with history of hypercoagulability and prior pump thrombosis admitted for heparin gtt while INR therapeutic.  LDH now back down to 287 - INR 3.36 Off heparin. Continue coumadin. Discussed dosing with PharmD personally. 2. Chronic systolic CHF: Ischemic cardiomyopathy. St Jude ICD. NSTEMI in March 2018 with DES to LAD and RCA, complicated by cardiogenic shock, low output requiring milrinone. Echo 8/18 with EF 20-25%, moderate MR. CPX (8/18) with severe functional impairment due to HF. He is s/p HM3 LVAD placement. He had pump thrombosis 6/19 due to [facility] outflow graft kinking. He had pump exchange for another HM3.RHC showed low filling pressures and good cardiac output 08/09/18.  We requested that renal increase his dry weight based on RHC.  - Continue Bidil 1 tabTIDon non-HD days.  - INR 3.3. Goal INR 2-2.5 - Continue ASA 81.  - He is now listed for heart/kidney at [facility]. [place] 08/09/18 with optimized filling pressures and cardiac output. Normal PA pressure.  3. ESRD: Developed peri-operatively after HM3 placement. He is tolerating HD, currently via catheter but had LUE graft placed last admission. Hematoma post-AV LUE graft placement. VVS saw, suspects hematoma. The graft is not functional.  - Still with left arm heamtoma. VVS following. Improving with conservative therapy. Left hand still weak but has palpable radial pulse. Suspect related to nerve compression/irritation with hematoma. No evidence compartment  syndrome. Will ask PT/OT to evaluate.  - MWF HD, dry weight has been increased based on RHC.  4. CAD: NSTEMI in 3/18. LHC with 99% ulcerated lesion proximal RCA with left to right collaterals, 95% mid LAD stenosis after mid LAD stent. s/p PCI to RCA and LAD on 11/25/16.  - No s/s of ischemia.    - Continue statin. 5. h/o DVTs: Factor V Leiden heterozygote.  6. Diabetic gastroparesis: Difficult to manage. He has seen gastroparesis specialist at [facility]. Surprisingly, gastric emptying study was normal. However, electrogastrogram showed poor gastric accomodation/capacity. Therefore, small frequent meals recommended. Also morning nausea thought to be GERD and Protonix was increased. Nausea seems to drive markedly high BP (resolves with nausea control). He was sent to ER from HD 12/18 with recurrent  nausea, slowly improving.    - Continue reglan 5 mg tid/ac, chronic erythromycin, and Marinol.  - Continue Protonix BID. - Continue Phenergan prn. Dose increased on 12/22. Works much better than Zofran. - Make effort to eat small, more frequent meals.  7. DM: HgbA1c well-controlled No change. SSI while in house.  8. HTN: Continue Bidil on non HD days.   - Hypertension seems to be driven by nausea (see above), so will try to minimize nausea. No change.  9. PAD: Moderate bilateral disease on ABIs in 8/18. Denies claudication currently, no pedal. No change.  10. Atrial flutter: Paroxysmal. Remains in NSR.  - On coumadin for LVAD.   11. Anemia: Received 1 PRBCs 12/9.  Hgb 10 -> 8.5 ->8.9. No evidence of GI bleeding.  12. Altered mental status: Noted on admit, appears to have completely recovered.  Suspect this may have been due to oversedation with multiple doses of phenergan and Ativan 08/22/18.  Head CT showed no acute changes.  - He is at mental baseline today.  13. Fever - had low grade temp on 12/20 - now resolved. ? Related to hematoma - BCX NGTD - WBC 16.8 -> 12.6 -> 15.2  I  reviewed the LVAD parameters from today, and compared the results to the patient's prior recorded data.  No programming changes were made.  The LVAD is functioning within specified parameters.  The patient performs LVAD self-test daily.  LVAD interrogation was negative for any significant power changes, alarms or PI events/speed drops.  LVAD equipment check completed and is in good working order.  Back-up equipment present.   LVAD education done on emergency procedures and precautions and reviewed exit site care.   Length of Stay: [place], MD 08/26/2018, 12:46 PM  VAD Team --- VAD ISSUES ONLY--- Pager [phone] (7am - 7am)  Advanced Heart Failure Team  Pager [phone] (M-F; 7a - 4p)  Please contact [facility] for night-coverage after hours (4p -7a ) and weekends on [email or web address]

## 2018-08-27 ENCOUNTER — Encounter (HOSPITAL_COMMUNITY): Payer: Self-pay

## 2018-08-27 LAB — CBC
HCT: 27.9 % — ABNORMAL LOW (ref 39.0–52.0)
Hemoglobin: 8.5 g/dL — ABNORMAL LOW (ref 13.0–17.0)
MCH: 28.9 pg (ref 26.0–34.0)
MCHC: 30.5 g/dL (ref 30.0–36.0)
MCV: 94.9 fL (ref 80.0–100.0)
Platelets: 310 10*3/uL (ref 150–400)
RBC: 2.94 MIL/uL — AB (ref 4.22–5.81)
RDW: 17.7 % — ABNORMAL HIGH (ref 11.5–15.5)
WBC: 14 10*3/uL — ABNORMAL HIGH (ref 4.0–10.5)
nRBC: 0.6 % — ABNORMAL HIGH (ref 0.0–0.2)

## 2018-08-27 LAB — GLUCOSE, CAPILLARY
GLUCOSE-CAPILLARY: 208 mg/dL — AB (ref 70–99)
Glucose-Capillary: 154 mg/dL — ABNORMAL HIGH (ref 70–99)

## 2018-08-27 LAB — BASIC METABOLIC PANEL
Anion gap: 9 (ref 5–15)
BUN: 18 mg/dL (ref 6–20)
CHLORIDE: 98 mmol/L (ref 98–111)
CO2: 25 mmol/L (ref 22–32)
CREATININE: 6.07 mg/dL — AB (ref 0.61–1.24)
Calcium: 7.8 mg/dL — ABNORMAL LOW (ref 8.9–10.3)
GFR calc Af Amer: 11 mL/min — ABNORMAL LOW (ref >60)
GFR calc non Af Amer: 10 mL/min — ABNORMAL LOW (ref >60)
Glucose, Bld: 244 mg/dL — ABNORMAL HIGH (ref 70–99)
Potassium: 4.2 mmol/L (ref 3.5–5.1)
SODIUM: 132 mmol/L — AB (ref 135–145)

## 2018-08-27 LAB — PROTIME-INR
INR: 2.49
Prothrombin Time: 26.5 seconds — ABNORMAL HIGH (ref 11.4–15.2)

## 2018-08-27 LAB — LACTATE DEHYDROGENASE: LDH: 295 U/L — ABNORMAL HIGH (ref 98–192)

## 2018-08-27 MED ORDER — WARFARIN SODIUM 5 MG PO TABS: 5.0000 mg | ORAL_TABLET | Freq: Every day | ORAL | Status: DC

## 2018-08-27 NOTE — Progress Notes (Signed)
LVAD Coordinator Rounding Note:  Pt admitted per Dr. Aundra Dubin 08/22/18 for nausea and vomiting during dialysis treatment at [place].   HM III LVAD implanted on 06/12/17 by Dr. Prescott Gum under Destination Therapy criteria due to renal insufficiency. Pump exchange on 03/01/18 for outflow graft twist/obstruction.   Pt sitting up on the side of the bed in good spirits this morning. Pt states that he feels ready to go home.   Vital signs: Temp:  98.7 HR:  94 Doppler Pressure:  88 Auto BP:  107/87 (93) O2 Sat: 98% on RA Wt: 180>179.4>178.5>176.5>181 lbs  LVAD interrogation reveals:  Speed: 5500 Flow: 4.0 Power: 4.4w PI: 3.3 Alarms: none Events: rare w/ outlier on 12/22 w/20+ PI Hematocrit: 27  Fixed speed: 5500 Low speed limit: 5200  Drive Line:  CDI. Weekly dressing kits.  Next dressing change due 08/31/18; bedside nurse may change dressing.   Labs: LDH trend: 403>368>249>295  INR trend: 1.70>1.90>2.70>2.49  WBC trend:  15.9>15.4>16.8>14  Infection: - 08/23/18 > BCs x 2 drawn - results pending  Gtts: - 08/22/18 > IV heparin per PharmD > stopped 08/24/18  Anticoagulation Plan: - INR Goal: 2.0 - 3.0 - ASA Dose: 81 mg daily   Device: -St Jude dual  -Therapies: on 200 bpm - Monitor: on VT 150 bpm  Adverse Events on VAD: - ESRD post LVAD; started CVVH 06/15/17; HD started 06/20/17 - Hospitalization 11/5 - 07/16/17 > gastroparesis diagnosis after EGD - Hospitalization 11/26 - 08/04/17 > gastroparesis - Hospitalization 1/8 - 09/16/17>gastroparesis - referred to Dr. Corliss Parish at [facility] - 10/31/17>rightupper arm arteriovenous graft with Artegraft; ligation of right first stage brachial vein transposition  - 12/11/13 elective admission for thrombectomy for AV graft in RUA  - 12/20/17 > intractable N/V - 01/19/18 > intractable N/V sent by EMS from dialysis center - 03/01/18 > outflow graft twist/occulsion requiring pump exchange - 05/23/18 > admitted for non-working fistula  in RUA - 06/10/18 > admitted for intractable N/V - 08/08/18 > admitted for peri-op management for 2nd stage of AV fistula, found to be sclerotic. New LUE graft created from brachial artery to axillary vein. Developed spontaneous LUE hematoma.  - 08/22/18 > admitted for intractable N/V  Plan/Recommendations:  1. Please page VAD coordinator with any equipment questions or concerns. 2. Weekly VAD dressing changes - due 08/31/18, bedside nurse to perform.   [name] RN [place] Coordinator  Office: [phone]  24/7 Pager: [phone]

## 2018-08-27 NOTE — Care Management Note (Signed)
Case Management Note  Patient Details  Name: CATON POPOWSKI MRN: [id number] Date of Birth: 1966-07-30  Subjective/Objective:                  Pt admitted with [facility]  Action/Plan:   PTA independent from home, on outpt ESRD.  Pt interested in outpt OT - CM sent referral over to location of choice [facility].  NO other CM needs determined prior to discharge   Expected Discharge Date:  08/27/18               Expected Discharge Plan:  Home/Self Care  In-House Referral:     Discharge planning Services  CM Consult  Post Acute Care Choice:    Choice offered to:  Patient  DME Arranged:    DME Agency:     HH Arranged:    [place] Agency:     Status of Service:  Completed, signed off  If discussed at [employer] of Stay Meetings, dates discussed:    Additional Comments:  Maryclare Labrador, RN 08/27/2018, 4:20 PM

## 2018-08-27 NOTE — Progress Notes (Addendum)
Patient ID: [name], male   DOB: 03-06-1966, 52 y.o.   MRN: [id number]    Advanced Heart Failure VAD Team Note  PCP-Cardiologist: No primary care provider on file.   Subjective:    Tolerated HD well 08/26/18. Phenergan increased 08/25/18 and nausea improved.   Feeling back to his baseline in all regards except his LUE. PT/OT to evaluate today. Otherwise, he would like to go home. Denies SOB, CP, or Nausea this am.   LVAD Interrogation HM 3: Speed: 5500 Flow: 4.6 PI: 1.9 Power: 4.0. ~20 PI events yesterday post HD.   Objective:    Vital Signs:   Temp:  [97.9 F (36.6 C)-99 F (37.2 C)] 98.7 F (37.1 C) (12/23 0743) Pulse Rate:  [75-100] 100 (12/22 1900) Resp:  [15-24] 22 (12/23 0000) BP: (81-109)/(55-87) 107/87 (12/23 0743) SpO2:  [98 %-99 %] 98 % (12/22 1900) Weight:  [80.7 kg-82.1 kg] 82.1 kg (12/23 0500) Last BM Date: 08/22/18 Mean arterial Pressure 70s mostly. (occasional 60s and 80s).   Intake/Output:   Intake/Output Summary (Last 24 hours) at 08/27/2018 0749 Last data filed at 08/26/2018 1835 Gross per 24 hour  Intake 360 ml  Output 0 ml  Net 360 ml     Physical Exam    General: NAD  HEENT: Normal. Neck: Supple, JVP not elevated. Carotids OK.  Cardiac:  LVAD hum. HD catheter OK.   Lungs:  CTAB, normal effort.  Abdomen:  NT, ND, no HSM. No bruits or masses. +BS  LVAD exit site: Dressing dry and intact. No erythema or drainage. Stabilization device present and accurately applied. Driveline dressing changed daily per sterile technique. Extremities:  Warm and dry. No cyanosis, clubbing, rash, or edema. LUE swollen with hematoma. AVF nonfunctional. Mild weakness in fingers (median nerve distribution) Neuro:  Alert & oriented x 3. Cranial nerves grossly intact. Moves all 4 extremities w/o difficulty. Affect pleasant     Telemetry   NSR 80s, personally reviewed.   Labs   Basic Metabolic Panel: Recent Labs  Lab 08/22/18 1122 08/23/18 0245  08/24/18 0333 08/25/18 0621 08/26/18 0301  NA 134* 133* 135 136 137  K 4.4 4.9 4.4 3.6 3.9  CL 97* 97* 98 96* 100  CO2 22 24 23 25 22   GLUCOSE 107* 123* 61* 107* 79  BUN 16 27* 39* 17 24*  CREATININE 4.20* 6.01* 8.42* 6.23* 8.39*  CALCIUM 9.4 9.0 8.6* 8.4* 8.3*    Liver Function Tests: Recent Labs  Lab 08/22/18 1122  AST 45*  ALT 19  ALKPHOS 116  BILITOT 1.1  PROT 7.6  ALBUMIN 3.1*   No results for input(s): LIPASE, AMYLASE in the last 168 hours. No results for input(s): AMMONIA in the last 168 hours.  CBC: Recent Labs  Lab 08/22/18 1122 08/23/18 0245 08/24/18 0333 08/25/18 0621 08/26/18 0301 08/27/18 0711  WBC 15.9* 15.4* 16.8* 12.6* 15.3* 14.0*  NEUTROABS 13.8*  --   --   --   --   --   HGB 9.7* 10.0* 8.5* 9.3* 8.9* 8.5*  HCT 31.1* 30.9* 27.2* 29.1* 28.2* 27.9*  MCV 89.9 90.4 89.5 92.4 93.1 94.9  PLT 315 326 338 374 359 310    INR: Recent Labs  Lab 08/23/18 0531 08/24/18 0333 08/25/18 0621 08/26/18 0301 08/27/18 0711  INR 1.90 2.70 3.00 3.36 2.49    Other results:    Imaging   No results found.   Medications:     Scheduled Medications: . amiodarone  200 mg Oral Daily  .  aspirin EC  81 mg Oral Daily  . atorvastatin  40 mg Oral q1800  . calcitRIOL  0.5 mcg Oral Q M,W,F  . Chlorhexidine Gluconate Cloth  6 each Topical Q0600  . citalopram  20 mg Oral Daily  . dronabinol  2.5 mg Oral BID AC  . erythromycin  250 mg Oral TID  . insulin aspart  0-5 Units Subcutaneous QHS  . insulin aspart  0-9 Units Subcutaneous TID WC  . insulin glargine  10 Units Subcutaneous Daily  . isosorbide-hydrALAZINE  1.5 tablet Oral 3 times per day on Sun Tue Thu Sat  . metoCLOPramide  10 mg Oral TID  . multivitamin  1 tablet Oral QHS  . pantoprazole  40 mg Oral BID  . sevelamer carbonate  4,000 mg Oral TID WC  . Warfarin - Pharmacist Dosing Inpatient   Does not apply q1800    Infusions:   PRN Medications: acetaminophen, docusate sodium, hydrALAZINE,  lidocaine-prilocaine, LORazepam, LORazepam, ondansetron (ZOFRAN) IV, promethazine, promethazine, traMADol   Patient Profile   [name] is a 52 y.o. malewith history of CAD s/p anterior MI in 2010 and NSTEMI in 3/18 with DES to pRCA and mLAD, diabetic, gastroparesis, Chrons disease, DMIIand ischemic cardiomyopathy, 10/2018s/p Heartmate 3 LVAD(emergent exchange 02/2018 due to outflow graft kink)and ESRD(HD MWF).  Assessment/Plan:    1. Elevated LDH and subtherapeutic INR: Elevated LDH may be from large resolving hematoma in left arm from AV graft surgery.  However, with history of hypercoagulability and prior pump thrombosis admitted for heparin gtt while INR therapeutic.  LDH now back down to 287 - INR 2.49. Off heparin. Continue coumadin. Dosing per PharmD.  2. Chronic systolic CHF: Ischemic cardiomyopathy. St Jude ICD. NSTEMI in March 2018 with DES to LAD and RCA, complicated by cardiogenic shock, low output requiring milrinone. Echo 8/18 with EF 20-25%, moderate MR. CPX (8/18) with severe functional impairment due to HF. He is s/p HM3 LVAD placement. He had pump thrombosis 6/19 due to [facility] outflow graft kinking. He had pump exchange for another HM3.RHC showed low filling pressures and good cardiac output 08/09/18.  We requested that renal increase his dry weight based on RHC.  - Continue Bidil 1 tabTIDon non-HD days.  - INR 2.49. Goal INR 2-2.5. Dosing per Pharm-D.  - Continue ASA 81.  - He is now listed for heart/kidney at [facility]. [place] 08/09/18 with optimized filling pressures and cardiac output. Normal PA pressure.  3. ESRD: Developed peri-operatively after HM3 placement. He is tolerating HD, currently via catheter but had LUE graft placed last admission. Hematoma post-AV LUE graft placement. VVS saw, suspects hematoma. The graft is not functional.  - Still with left arm hematoma. VVS following. Improving with conservative therapy. Left hand still weak but has palpable  radial pulse. Suspect related to nerve compression/irritation with hematoma. No evidence compartment syndrome. PT/OT to evaluation.   - MWF HD, dry weight has been increased based on RHC.  4. CAD: NSTEMI in 3/18. LHC with 99% ulcerated lesion proximal RCA with left to right collaterals, 95% mid LAD stenosis after mid LAD stent. s/p PCI to RCA and LAD on 11/25/16.  - No s/s of ischemia.     - Continue statin. 5. h/o DVTs: Factor V Leiden heterozygote.  6. Diabetic gastroparesis: Difficult to manage. He has seen gastroparesis specialist at [facility]. Surprisingly, gastric emptying study was normal. However, electrogastrogram showed poor gastric accomodation/capacity. Therefore, small frequent meals recommended. Also morning nausea thought to be GERD and Protonix was  increased. Nausea seems to drive markedly high BP (resolves with nausea control). He was sent to ER from HD 12/18 with recurrent nausea, slowly improving.    - Continue reglan 5 mg tid/ac, chronic erythromycin, and Marinol.  - Continue Protonix BID. - Continue Phenergan prn. Dose increased on 12/22. Works much better than Zofran. - Make effort to eat small, more frequent meals.  7. DM: HgbA1c well-controlled  - No change. SSI while in house.  8. HTN: Continue Bidil on non HD days.   - Hypertension seems to be driven by nausea (see above), so will try to minimize nausea. no change.  9. PAD: Moderate bilateral disease on ABIs in 8/18. Denies claudication currently, no pedal. No change.  10. Atrial flutter: Paroxysmal.  - Remains in NSR.  - On coumadin for LVAD.   11. Anemia: Received 1 PRBCs 12/9.  Hgb 10 -> 8.5 ->8.9 -> 8.5. No overt bleeding.  12. Altered mental status: Noted on admit, appears to have completely recovered.  Suspect this may have been due to oversedation with multiple doses of phenergan and Ativan 08/22/18.  Head CT showed no acute changes.  - He has since remained at mental baseline.  13. Fever - had  low grade temp on 12/20 - Resolved. ? Related to hematoma - BCX NG x 3 days.  - WBC 16.8 -> 12.6 -> 15.2 -> 14.0  I reviewed the LVAD parameters from today, and compared the results to the patient's prior recorded data.  No programming changes were made.  The LVAD is functioning within specified parameters.  The patient performs LVAD self-test daily.  LVAD interrogation was negative for any significant power changes, alarms or PI events/speed drops.  LVAD equipment check completed and is in good working order.  Back-up equipment present.   LVAD education done on emergency procedures and precautions and reviewed exit site care.  Length of Stay: 5  Annamaria Helling 08/27/2018, 7:49 AM  VAD Team --- VAD ISSUES ONLY--- Pager [phone] (7am - 7am)  Advanced Heart Failure Team  Pager [phone] (M-F; 7a - 4p)  Please contact [facility] for night-coverage after hours (4p -7a ) and weekends on [email or web address]   Patient seen and examined with the above-signed Advanced Practice Provider and/or Housestaff. I personally reviewed laboratory data, imaging studies and relevant notes. I independently examined the patient and formulated the important aspects of the plan. I have edited the note to reflect any of my changes or salient points. I have personally discussed the plan with the patient and/or family.  Tolerating HD well. N/V much improved. LUE hematoma resolving. Seen by VVS and OT and given exercises for left hand. VAD interrogated personally. Parameters stable. INR 2.49. Discussed dosing with PharmD personally. [place] for d/c today with close f/u.   [name], MD  6:32 PM

## 2018-08-27 NOTE — Progress Notes (Signed)
PT Cancellation Note  Patient Details Name: [name] MRN: [id number] DOB: 07/24/1966   Cancelled Treatment:    Reason Eval/Treat Not Completed: PT screened, no needs identified, will sign off   Denice Paradise 08/27/2018, 11:55 AM  Abegail Kloeppel,PT Acute Rehabilitation Services Pager:  [phone]  Office:  [phone]

## 2018-08-27 NOTE — Progress Notes (Signed)
ANTICOAGULATION CONSULT NOTE - [place] for warfarin Indication: LVAD/Factor 5 Leiden  Allergies  Allergen Reactions  . Metformin And Related Diarrhea    Patient Measurements: Height: 5' 7"  (170.2 cm) Weight: 181 lb (82.1 kg) IBW/kg (Calculated) : 66.1 Heparin Dosing Weight: 81kg  Vital Signs: Temp: 98.7 F (37.1 C) (12/23 0743) Temp Source: Axillary (12/23 0743) BP: 107/87 (12/23 0743)  Labs: Recent Labs    08/25/18 0621 08/26/18 0301 08/27/18 0711  HGB 9.3* 8.9* 8.5*  HCT 29.1* 28.2* 27.9*  PLT 374 359 310  LABPROT 30.7* 33.5* 26.5*  INR 3.00 3.36 2.49  CREATININE 6.23* 8.39* 6.07*    Estimated Creatinine Clearance: 14.4 mL/min (A) (by C-G formula based on SCr of 6.07 mg/dL (H)).   Medical History: Past Medical History:  Diagnosis Date  . Angina   . ASCVD (arteriosclerotic cardiovascular disease)    , Anterior infarction 2005, LAD diagonal bifurcation intervention 03/2004  . Automatic implantable cardiac defibrillator -[employer]       . Benign neoplasm of colon   . CHF (congestive heart failure) ([place])   . Chronic systolic heart failure ([place])   . Coronary artery disease     Widely patent previously placed stents in the left anterior   . Crohn's disease ([place])   . 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 Dialysis patient ([place])   . Dyspnea   . Gastroparesis   . GERD (gastroesophageal reflux disease)   . High cholesterol   . Hyperlipidemia   . Hypersomnolent    Previous diagnosis of narcolepsy  . Hypertension, essential   . Ischemic cardiomyopathy    Ejection fraction 15-20% catheterization 2010  . Type II or unspecified type diabetes mellitus without mention of complication, not stated as uncontrolled   . Unspecified gastritis and gastroduodenitis without mention of hemorrhage     Assessment: 69 yoM on warfarin PTA for LVAD. Pt admitted for nausea/vomiting during HD. INR on admission 1.70 despite being  2.5 in clinic yesterday and LDH was up 400. Per MD, will continue warfarin and start IV heparin bridge given elevated LDH in setting of subtherapeutic INR.  Heparin stopped 12/20. INR has continued to trend up to 3.3 with poor po intake and N/V.   LDH trending back down 200s  Hgb stable 8.9. HD planned as outpt 12/24.   *PTA Warfarin dose = 79m daily except 7.560mMonday  Goal of Therapy:  INR 2-3 Monitor platelets by anticoagulation protocol: Yes   Plan:  Will restart warfarin 76m14maily If DC home today check INR on Thur on home machine and call LVAD pager  [place]  LisBonnita Nasutiarm.D. CPP, BCPS Clinical Pharmacist [phone]/23/2019 11:17 AM

## 2018-08-27 NOTE — Progress Notes (Signed)
Pt discharging to home, discharge instructions provided to patient. Not questions at this time.

## 2018-08-27 NOTE — Evaluation (Signed)
Occupational Therapy Evaluation Patient Details Name: [name] MRN: [id number] DOB: 24-Feb-1966 Today's Date: 08/27/2018    History of Present Illness Pt is a 52 year old man admitted from HD with nausea and vomiting, unable to complete full session. Underwent placement of L UE AVG, complicated by hematoma and decreased use of L hand. PMH: CAD, NSTEMI, DM, gastroparesis, ischemic cardiomyopathy, LVAD 10/18 with emergent pump exchange 6/19.   Clinical Impression   Pt typically functions independently. Presents with impaired use of L hand following AVG. Educated and provided pt with HEP using yellow theraputty and squeeze ball as well as fine motor activities. Will follow acutely, recommending continued OT on an OP basis.    Follow Up Recommendations  Outpatient OT    Equipment Recommendations  None recommended by OT    Recommendations for Other Services       Precautions / Restrictions Precautions Precaution Comments: LVAD Restrictions Weight Bearing Restrictions: No      Mobility Bed Mobility Overal bed mobility: Independent                Transfers Overall transfer level: Independent                    Balance Overall balance assessment: No apparent balance deficits (not formally assessed)                                         ADL either performed or assessed with clinical judgement   ADL Overall ADL's : Modified independent                                       General ADL Comments: needs assist to manage LVAD power exchange due to limited L hand use     Vision Baseline Vision/History: Wears glasses Wears Glasses: At all times Patient Visual Report: No change from baseline       Perception     Praxis      Pertinent Vitals/Pain Pain Assessment: No/denies pain     Hand Dominance Right   Extremity/Trunk Assessment Upper Extremity Assessment Upper Extremity Assessment: LUE deficits/detail LUE  Deficits / Details: reports numbness on ulnar side, gross grasp 3/5, full extension, limited thumb opposition. LUE Sensation: decreased light touch LUE Coordination: decreased fine motor   Lower Extremity Assessment Lower Extremity Assessment: Overall WFL for tasks assessed   Cervical / Trunk Assessment Cervical / Trunk Assessment: Normal   Communication Communication Communication: No difficulties   Cognition Arousal/Alertness: Awake/alert Behavior During Therapy: WFL for tasks assessed/performed Overall Cognitive Status: Within Functional Limits for tasks assessed                                     General Comments       Exercises Exercises: Other exercises Other Exercises Other Exercises: educated pt in theraputty exercises with yellow putty Other Exercises: instructed in fine motor activities and issued handout.   Shoulder Instructions      Home Living Family/patient expects to be discharged to:: Private residence Living Arrangements: Other relatives;Children Available Help at Discharge: Family;Available 24 hours/day Type of Home: House Home Access: Stairs to enter [employer] of Steps: 2 Entrance Stairs-Rails: None Home Layout: Multi-level;Able to live on  main level with bedroom/bathroom Alternate Level Stairs-Number of Steps: flight Alternate Level Stairs-Rails: Left Bathroom Shower/Tub: Teacher, early years/pre: Standard     Home Equipment: Cane - single point;Walker - 4 wheels   Additional Comments: pt lives with son and daughter, one of which is with him all the time. His bedroom is downstairs but the kids have moved him to the main level so he will only have to do 2 STE      Prior Functioning/Environment Level of Independence: Independent                 OT Problem List: Decreased coordination      OT Treatment/Interventions: Therapeutic exercise;Patient/family education    OT Goals(Current goals can be  found in the care plan section) Acute Rehab OT Goals Patient Stated Goal: regain use of L hand OT Goal Formulation: With patient Time For Goal Achievement: 09/10/18 Potential to Achieve Goals: Good ADL Goals Pt/caregiver will Perform Home Exercise Program: Increased strength;Left upper extremity;With theraputty;Independently;With written HEP provided  OT Frequency: Min 2X/week   Barriers to D/C:            Co-evaluation              AM-PAC OT "6 Clicks" Daily Activity     Outcome Measure Help from another person eating meals?: A Little Help from another person taking care of personal grooming?: A Little Help from another person toileting, which includes using toliet, bedpan, or urinal?: None Help from another person bathing (including washing, rinsing, drying)?: None Help from another person to put on and taking off regular upper body clothing?: None Help from another person to put on and taking off regular lower body clothing?: None 6 Click Score: 22   End of Session    Activity Tolerance: Patient tolerated treatment well Patient left: in bed;with call bell/phone within reach;with family/visitor present  OT Visit Diagnosis: Muscle weakness (generalized) (M62.81)                Time: 6203-5597 OT Time Calculation (min): 28 min Charges:  OT General Charges $OT Visit: 1 Visit OT Evaluation $OT Eval Moderate Complexity: 1 Mod OT Treatments $Therapeutic Activity: 8-22 mins  [name], OTR/L Acute Rehabilitation Services Pager: [phone] Office: [phone] [place] 08/27/2018, 12:28 PM

## 2018-08-27 NOTE — Progress Notes (Signed)
Patient ID: [name], male   DOB: 05-Feb-1966, 52 y.o.   MRN: [id number] No change in left arm or hand.  Denies any pain in his left hand.  Does report some tingling and diminished grip strength but reports this is unchanged.  Moderate hematoma around upper arm graft.  Doppler flow in the graft which appears patent.  Continue to function with left IJ hemodialysis catheter.  Will arrange follow-up as outpatient

## 2018-08-27 NOTE — Progress Notes (Signed)
Patient ID: [name], male   DOB: 12/18/1965, 52 y.o.   MRN: [id number]  [facility] Progress Note    Subjective:   Feels better today   Objective:   BP 107/87 (BP Location: Right Arm)   Pulse 100   Temp 98.7 F (37.1 C) (Axillary)   Resp 19   Ht 5' 7"  (1.702 m)   Wt 82.1 kg   SpO2 98%   BMI 28.35 kg/m   Intake/Output: I/O last 3 completed shifts: In: 840 [P.O.:840] Out: 0    Intake/Output this shift:  No intake/output data recorded. Weight change: -0.1 kg  Physical Exam: Gen:NAD CVS: tachy, mechanical hum Resp: ctaq Abd: benign Ext: no edema, LUE AVG + ecchymoses, +bruit  Labs: BMET Recent Labs  Lab 08/22/18 1122 08/23/18 0245 08/24/18 0333 08/25/18 0621 08/26/18 0301 08/27/18 0711  NA 134* 133* 135 136 137 132*  K 4.4 4.9 4.4 3.6 3.9 4.2  CL 97* 97* 98 96* 100 98  CO2 22 24 23 25 22 25   GLUCOSE 107* 123* 61* 107* 79 244*  BUN 16 27* 39* 17 24* 18  CREATININE 4.20* 6.01* 8.42* 6.23* 8.39* 6.07*  ALBUMIN 3.1*  --   --   --   --   --   CALCIUM 9.4 9.0 8.6* 8.4* 8.3* 7.8*   CBC Recent Labs  Lab 08/22/18 1122  08/24/18 0333 08/25/18 0621 08/26/18 0301 08/27/18 0711  WBC 15.9*   < > 16.8* 12.6* 15.3* 14.0*  NEUTROABS 13.8*  --   --   --   --   --   HGB 9.7*   < > 8.5* 9.3* 8.9* 8.5*  HCT 31.1*   < > 27.2* 29.1* 28.2* 27.9*  MCV 89.9   < > 89.5 92.4 93.1 94.9  PLT 315   < > 338 374 359 310   < > = values in this interval not displayed.    @IMGRELPRIORS @ Medications:    . amiodarone  200 mg Oral Daily  . aspirin EC  81 mg Oral Daily  . atorvastatin  40 mg Oral q1800  . calcitRIOL  0.5 mcg Oral Q M,W,F  . Chlorhexidine Gluconate Cloth  6 each Topical Q0600  . citalopram  20 mg Oral Daily  . dronabinol  2.5 mg Oral BID AC  . erythromycin  250 mg Oral TID  . insulin aspart  0-5 Units Subcutaneous QHS  . insulin aspart  0-9 Units Subcutaneous TID WC  . insulin glargine  10 Units Subcutaneous Daily  .  isosorbide-hydrALAZINE  1.5 tablet Oral 3 times per day on Sun Tue Thu Sat  . metoCLOPramide  10 mg Oral TID  . multivitamin  1 tablet Oral QHS  . pantoprazole  40 mg Oral BID  . sevelamer carbonate  4,000 mg Oral TID WC  . Warfarin - Pharmacist Dosing Inpatient   Does not apply q1800   Dialysis Orders: MWF [facility] 4h 46mn 400/800 80.5kg 2K/3Ca bath Hep none TDC/ new LUA AVG (12/5, maturing) - Calcitriol 135m PO q HD - ESA mircera 100 ug q 2 wks, last 12/16  Assessment/ Plan:   1. Nausea/vomiting- improved over the past 24 hours. Cont reglan, emycin, phenergan, protonix. 2. Elevated LDH- concerning for thrombolysis, improving with anticoagulation 3. ICMP- s/p LVAD with pump thrombosis on 6/19 s/p exchange.  Per CHF team. 4. ESRD continue with HD qMWF (off schedule due to holidays so [facility]. 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 CAD- s/p NSTEMI in 3/18 per Cards. 8. Nutrition: renal diet 9. Hypertension: stable 10. Vascular access- LUE AVG +bruit and dopplerable pulse, + hematoma Dr. Donnetta Hutching would like to monior for now.  No indication for ligation at this time 11. Disposition- hopeful discharge today  Donetta Potts, MD [place] Pager [phone] 08/27/2018, 9:42 AM

## 2018-08-27 NOTE — Discharge Instructions (Signed)

## 2018-08-28 ENCOUNTER — Other Ambulatory Visit: Payer: Self-pay

## 2018-08-28 ENCOUNTER — Emergency Department (HOSPITAL_COMMUNITY): Payer: BLUE CROSS/BLUE SHIELD

## 2018-08-28 ENCOUNTER — Inpatient Hospital Stay (HOSPITAL_COMMUNITY)
Admission: EM | Admit: 2018-08-28 | Discharge: 2018-09-03 | Disposition: A | Discharge status: Home / Self Care | Payer: BLUE CROSS/BLUE SHIELD | Source: Home / Self Care | Attending: Internal Medicine | Admitting: Internal Medicine

## 2018-08-28 ENCOUNTER — Encounter (HOSPITAL_COMMUNITY): Payer: Self-pay | Admitting: *Deleted

## 2018-08-28 DIAGNOSIS — R7402 Elevation of levels of lactic acid dehydrogenase (LDH): Secondary | ICD-10-CM | POA: Diagnosis present

## 2018-08-28 DIAGNOSIS — N186 End stage renal disease: Secondary | ICD-10-CM

## 2018-08-28 DIAGNOSIS — I5022 Chronic systolic (congestive) heart failure: Secondary | ICD-10-CM | POA: Diagnosis present

## 2018-08-28 DIAGNOSIS — K3184 Gastroparesis: Secondary | ICD-10-CM | POA: Diagnosis present

## 2018-08-28 DIAGNOSIS — R791 Abnormal coagulation profile: Secondary | ICD-10-CM

## 2018-08-28 DIAGNOSIS — R112 Nausea with vomiting, unspecified: Secondary | ICD-10-CM | POA: Diagnosis present

## 2018-08-28 DIAGNOSIS — R740 Nonspecific elevation of levels of transaminase and lactic acid dehydrogenase [LDH]: Secondary | ICD-10-CM

## 2018-08-28 DIAGNOSIS — Z992 Dependence on renal dialysis: Secondary | ICD-10-CM

## 2018-08-28 DIAGNOSIS — Z95811 Presence of heart assist device: Secondary | ICD-10-CM

## 2018-08-28 LAB — CBC WITH DIFFERENTIAL/PLATELET
Abs Immature Granulocytes: 0.27 10*3/uL — ABNORMAL HIGH (ref 0.00–0.07)
BASOS ABS: 0 10*3/uL (ref 0.0–0.1)
Basophils Relative: 0 %
Eosinophils Absolute: 0.2 10*3/uL (ref 0.0–0.5)
Eosinophils Relative: 1 %
HEMATOCRIT: 35.5 % — AB (ref 39.0–52.0)
Hemoglobin: 11.2 g/dL — ABNORMAL LOW (ref 13.0–17.0)
Immature Granulocytes: 1 %
Lymphocytes Relative: 4 %
Lymphs Abs: 1 10*3/uL (ref 0.7–4.0)
MCH: 29.2 pg (ref 26.0–34.0)
MCHC: 31.5 g/dL (ref 30.0–36.0)
MCV: 92.4 fL (ref 80.0–100.0)
Monocytes Absolute: 1.2 10*3/uL — ABNORMAL HIGH (ref 0.1–1.0)
Monocytes Relative: 5 %
NRBC: 0.1 % (ref 0.0–0.2)
Neutro Abs: 23.7 10*3/uL — ABNORMAL HIGH (ref 1.7–7.7)
Neutrophils Relative %: 89 %
Platelets: 333 10*3/uL (ref 150–400)
RBC: 3.84 MIL/uL — ABNORMAL LOW (ref 4.22–5.81)
RDW: 18.3 % — ABNORMAL HIGH (ref 11.5–15.5)
WBC: 26.4 10*3/uL — ABNORMAL HIGH (ref 4.0–10.5)

## 2018-08-28 LAB — CULTURE, BLOOD (ROUTINE X 2)
Culture: NO GROWTH
Culture: NO GROWTH
Special Requests: ADEQUATE
Special Requests: ADEQUATE

## 2018-08-28 LAB — LIPASE, BLOOD: Lipase: 30 U/L (ref 11–51)

## 2018-08-28 LAB — COMPREHENSIVE METABOLIC PANEL
ALBUMIN: 3.6 g/dL (ref 3.5–5.0)
ALT: 21 U/L (ref 0–44)
AST: 35 U/L (ref 15–41)
Alkaline Phosphatase: 120 U/L (ref 38–126)
Anion gap: 15 (ref 5–15)
BILIRUBIN TOTAL: 1.2 mg/dL (ref 0.3–1.2)
BUN: 15 mg/dL (ref 6–20)
CALCIUM: 9.5 mg/dL (ref 8.9–10.3)
CO2: 24 mmol/L (ref 22–32)
Chloride: 96 mmol/L — ABNORMAL LOW (ref 98–111)
Creatinine, Ser: 5.11 mg/dL — ABNORMAL HIGH (ref 0.61–1.24)
GFR calc Af Amer: 14 mL/min — ABNORMAL LOW (ref >60)
GFR calc non Af Amer: 12 mL/min — ABNORMAL LOW (ref >60)
Glucose, Bld: 162 mg/dL — ABNORMAL HIGH (ref 70–99)
Potassium: 3.4 mmol/L — ABNORMAL LOW (ref 3.5–5.1)
Sodium: 135 mmol/L (ref 135–145)
Total Protein: 8.1 g/dL (ref 6.5–8.1)

## 2018-08-28 LAB — GLUCOSE, CAPILLARY: Glucose-Capillary: 260 mg/dL — ABNORMAL HIGH (ref 70–99)

## 2018-08-28 LAB — LACTATE DEHYDROGENASE: LDH: 427 U/L — ABNORMAL HIGH (ref 98–192)

## 2018-08-28 LAB — PROTIME-INR
INR: 1.32
Prothrombin Time: 16.2 seconds — ABNORMAL HIGH (ref 11.4–15.2)

## 2018-08-28 MED ORDER — PROMETHAZINE HCL 25 MG/ML IJ SOLN
12.5000 mg | Freq: Once | INTRAMUSCULAR | Status: AC
  Administered 2018-08-28: 12.5 mg via INTRAVENOUS
  Filled 2018-08-28: qty 1

## 2018-08-28 MED ORDER — ATORVASTATIN CALCIUM 40 MG PO TABS
40.0000 mg | ORAL_TABLET | Freq: Every day | ORAL | Status: DC
  Administered 2018-08-29 – 2018-09-02 (×5): 40 mg via ORAL
  Filled 2018-08-28 (×5): qty 1

## 2018-08-28 MED ORDER — LIDOCAINE-PRILOCAINE 2.5-2.5 % EX CREA: 1.0000 "application " | TOPICAL_CREAM | CUTANEOUS | Status: DC | PRN

## 2018-08-28 MED ORDER — PROMETHAZINE HCL 25 MG/ML IJ SOLN
25.0000 mg | Freq: Four times a day (QID) | INTRAMUSCULAR | Status: DC | PRN
  Administered 2018-08-28 – 2018-09-01 (×9): 25 mg via INTRAVENOUS
  Filled 2018-08-28 (×9): qty 1

## 2018-08-28 MED ORDER — LORAZEPAM 2 MG/ML IJ SOLN
1.0000 mg | Freq: Once | INTRAMUSCULAR | Status: AC
  Administered 2018-08-28: 1 mg via INTRAVENOUS
  Filled 2018-08-28: qty 1

## 2018-08-28 MED ORDER — ACETAMINOPHEN 325 MG PO TABS
650.0000 mg | ORAL_TABLET | ORAL | Status: DC | PRN
  Filled 2018-08-28: qty 2

## 2018-08-28 MED ORDER — SEVELAMER CARBONATE 800 MG PO TABS
4000.0000 mg | ORAL_TABLET | Freq: Three times a day (TID) | ORAL | Status: DC
  Administered 2018-08-30 (×3): 4000 mg via ORAL
  Filled 2018-08-28 (×4): qty 5

## 2018-08-28 MED ORDER — CIPROFLOXACIN HCL 500 MG PO TABS
500.0000 mg | ORAL_TABLET | ORAL | Status: AC
  Administered 2018-08-28 – 2018-09-01 (×4): 500 mg via ORAL
  Filled 2018-08-28 (×4): qty 1

## 2018-08-28 MED ORDER — INSULIN ASPART 100 UNIT/ML ~~LOC~~ SOLN
5.0000 [IU] | Freq: Three times a day (TID) | SUBCUTANEOUS | Status: DC
  Administered 2018-08-30 – 2018-09-02 (×6): 5 [IU] via SUBCUTANEOUS

## 2018-08-28 MED ORDER — INSULIN GLARGINE 100 UNIT/ML ~~LOC~~ SOLN
10.0000 [IU] | Freq: Every day | SUBCUTANEOUS | Status: DC
  Administered 2018-08-29 – 2018-09-02 (×4): 10 [IU] via SUBCUTANEOUS
  Filled 2018-08-28 (×6): qty 0.1

## 2018-08-28 MED ORDER — ASPIRIN EC 81 MG PO TBEC
81.0000 mg | DELAYED_RELEASE_TABLET | Freq: Every day | ORAL | Status: DC
  Administered 2018-08-29 – 2018-09-03 (×6): 81 mg via ORAL
  Filled 2018-08-28 (×6): qty 1

## 2018-08-28 MED ORDER — ERYTHROMYCIN BASE 250 MG PO TABS
250.0000 mg | ORAL_TABLET | Freq: Three times a day (TID) | ORAL | Status: DC
  Administered 2018-08-29 – 2018-09-03 (×14): 250 mg via ORAL
  Filled 2018-08-28 (×14): qty 1

## 2018-08-28 MED ORDER — ONDANSETRON HCL 4 MG/2ML IJ SOLN
4.0000 mg | Freq: Four times a day (QID) | INTRAMUSCULAR | Status: DC | PRN
  Administered 2018-08-31: 4 mg via INTRAVENOUS

## 2018-08-28 MED ORDER — PANTOPRAZOLE SODIUM 40 MG PO TBEC
40.0000 mg | DELAYED_RELEASE_TABLET | Freq: Two times a day (BID) | ORAL | Status: DC
  Administered 2018-08-28 – 2018-09-03 (×10): 40 mg via ORAL
  Filled 2018-08-28 (×11): qty 1

## 2018-08-28 MED ORDER — SEVELAMER CARBONATE 800 MG PO TABS: 1600.0000 mg | ORAL_TABLET | ORAL | Status: DC | PRN

## 2018-08-28 MED ORDER — SEVELAMER CARBONATE 800 MG PO TABS: 1600.0000 mg | ORAL_TABLET | ORAL | Status: DC

## 2018-08-28 MED ORDER — WARFARIN - PHARMACIST DOSING INPATIENT: Freq: Every day | Status: DC

## 2018-08-28 MED ORDER — DRONABINOL 2.5 MG PO CAPS
2.5000 mg | ORAL_CAPSULE | Freq: Two times a day (BID) | ORAL | Status: DC
  Administered 2018-08-29 – 2018-09-03 (×9): 2.5 mg via ORAL
  Filled 2018-08-28 (×10): qty 1

## 2018-08-28 MED ORDER — RENA-VITE PO TABS
1.0000 | ORAL_TABLET | Freq: Every day | ORAL | Status: DC
  Administered 2018-08-28 – 2018-09-02 (×5): 1 via ORAL
  Filled 2018-08-28 (×5): qty 1

## 2018-08-28 MED ORDER — TRAMADOL HCL 50 MG PO TABS: 50.0000 mg | ORAL_TABLET | Freq: Four times a day (QID) | ORAL | Status: DC | PRN

## 2018-08-28 MED ORDER — CITALOPRAM HYDROBROMIDE 20 MG PO TABS
20.0000 mg | ORAL_TABLET | Freq: Every day | ORAL | Status: DC
  Administered 2018-08-29 – 2018-09-03 (×6): 20 mg via ORAL
  Filled 2018-08-28 (×6): qty 1

## 2018-08-28 MED ORDER — METOCLOPRAMIDE HCL 10 MG PO TABS
10.0000 mg | ORAL_TABLET | Freq: Three times a day (TID) | ORAL | Status: DC
  Administered 2018-08-29 – 2018-09-03 (×15): 10 mg via ORAL
  Filled 2018-08-28 (×15): qty 1

## 2018-08-28 MED ORDER — ISOSORB DINITRATE-HYDRALAZINE 20-37.5 MG PO TABS: 1.0000 | ORAL_TABLET | ORAL | Status: DC

## 2018-08-28 MED ORDER — HEPARIN (PORCINE) 25000 UT/250ML-% IV SOLN
1550.0000 [IU]/h | INTRAVENOUS | Status: DC
  Administered 2018-08-28 – 2018-08-29 (×2): 1550 [IU]/h via INTRAVENOUS
  Filled 2018-08-28: qty 250

## 2018-08-28 MED ORDER — AMIODARONE HCL 200 MG PO TABS
200.0000 mg | ORAL_TABLET | Freq: Every day | ORAL | Status: DC
  Administered 2018-08-29 – 2018-09-03 (×6): 200 mg via ORAL
  Filled 2018-08-28 (×6): qty 1

## 2018-08-28 MED ORDER — WARFARIN SODIUM 5 MG PO TABS
5.0000 mg | ORAL_TABLET | Freq: Once | ORAL | Status: AC
  Administered 2018-08-28: 5 mg via ORAL
  Filled 2018-08-28 (×2): qty 1

## 2018-08-28 MED ORDER — CALCITRIOL 0.25 MCG PO CAPS: 0.5000 ug | ORAL_CAPSULE | ORAL | Status: DC

## 2018-08-28 MED ORDER — DOCUSATE SODIUM 100 MG PO CAPS
100.0000 mg | ORAL_CAPSULE | Freq: Every day | ORAL | Status: DC | PRN
  Administered 2018-09-02: 100 mg via ORAL
  Filled 2018-08-28: qty 1

## 2018-08-28 MED ORDER — LORAZEPAM 0.5 MG PO TABS
0.5000 mg | ORAL_TABLET | Freq: Two times a day (BID) | ORAL | Status: DC | PRN
  Administered 2018-08-31 – 2018-09-02 (×3): 0.5 mg via ORAL
  Filled 2018-08-28 (×4): qty 1

## 2018-08-28 NOTE — ED Provider Notes (Signed)
[facility] EMERGENCY DEPARTMENT Provider Note   CSN: [id number] Arrival date & time: 08/28/18  8250     History   Chief Complaint No chief complaint on file.   HPI [name] is a 52 y.o. male.  The history is provided by the patient and medical records.  Abdominal Pain       52 year old male with history of LVAD, Crohn's disease, CAD, CHF, dialysis patient, history of gastroparesis, GERD, diabetes presenting with complaints of nausea and vomiting.  Patient report he developed sensation of nausea, has vomited in 4 episodes of nonbloody nonbilious contents, feeling queasy with abdominal discomfort rated as 7 out of 10 and described as squeezing sensation.  Symptoms started today.  He denies any associated fever or chills, no chest pain shortness of breath productive cough, no urinary complaints.  The symptom is similar to when he was admitted 4 days ago for same.  He did finish his dialysis session today.  Past Medical History:  Diagnosis Date  . Angina   . ASCVD (arteriosclerotic cardiovascular disease)    , Anterior infarction 2005, LAD diagonal bifurcation intervention 03/2004  . Automatic implantable cardiac defibrillator -[employer] Jude[employer]       . Benign neoplasm of colon   . CHF (congestive heart failure) ([place])   . Chronic systolic heart failure ([place])   . Coronary artery disease     Widely patent previously placed stents in the left anterior   . Crohn's disease ([place])   . 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 Dialysis patient ([place])   . Dyspnea   . Gastroparesis   . GERD (gastroesophageal reflux disease)   . High cholesterol   . Hyperlipidemia   . Hypersomnolent    Previous diagnosis of narcolepsy  . Hypertension, essential   . Ischemic cardiomyopathy    Ejection fraction 15-20% catheterization 2010  . Type II or unspecified type diabetes mellitus without mention of complication, not stated as uncontrolled   . Unspecified  gastritis and gastroduodenitis without mention of hemorrhage     Patient Active Problem List   Diagnosis Date Noted  . Elevated LDH 08/22/2018  . Acute vomiting   . Nausea & vomiting 06/11/2018  . Heart failure ([place]) 05/23/2018  . Hypertensive urgency 03/25/2018  . Benign essential HTN   . Crohn's disease with complication ([place])   . Diabetes mellitus type 2 in nonobese (HCC)   . Anemia of chronic disease   . Sinus tachycardia   . Leukocytosis   . Left ventricular assist device (LVAD) complication 53/97/6734  . Left ventricular assist device (LVAD) complication, initial encounter 02/28/2018  . Factor V Leiden carrier ([place]) 01/10/2018  . History of creation of ostomy ([place]) 01/10/2018  . Hypercoagulable state ([place]) 01/10/2018  . ESRD (end stage renal disease) ([place]) 12/11/2017  . Left ventricular assist device present ([place]) 12/05/2017  . ESRD (end stage renal disease) on dialysis ([place]) 10/29/2017  . Nausea with vomiting 09/12/2017  . Intractable nausea and vomiting 07/31/2017  . 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) ([place]) 07/10/2017  . LVAD (left ventricular assist device) present ([place])   . Palliative care by specialist   . Chronic systolic CHF (congestive heart failure) ([place]) 06/07/2017  . ACP (advance care planning)   . Encounter for central line placement   . Diarrhea   . Palliative care encounter   . Nausea   . CHF exacerbation ([place]) 04/05/2017  . Diabetic keto-acidosis ([place]) 04/05/2017  .  Type 2 diabetes mellitus with complication, with long-term current use of insulin ([place]) 03/08/2017  . ESRD on dialysis ([place]) 01/17/2017  . AKI (acute kidney injury) ([place])   . Non-ST elevation (NSTEMI) myocardial infarction ([place])   . CHF (congestive heart failure) ([place]) 09/02/2016  . Elevated serum creatinine   . Encounter for therapeutic drug monitoring 08/03/2016  . Chest pain 04/26/2012  . SVT (supraventricular tachycardia) ([place]) 04/26/2012  . Gastroparesis 11/18/2011  .  Nausea and vomiting in adult 11/17/2011  . Coronary artery disease   . Ischemic cardiomyopathy   . Essential hypertension   . Automatic implantable cardioverter-defibrillator in situ   . Deep venous thrombosis ([place])   . Hypersomnolent   . POLYP, COLON 09/25/2008  . Type 2 diabetes mellitus ([place]) 09/25/2008  . Dyslipidemia, goal LDL below 70 09/25/2008  . GASTRITIS 09/25/2008  . DIVERTICULOSIS, COLON 09/25/2008  . RECTAL MASS 09/25/2008  . History of Crohn's disease 09/05/2006    Past Surgical History:  Procedure Laterality Date  . A/V SHUNTOGRAM N/A 05/24/2018   Procedure: A/V SHUNTOGRAM;  Surgeon: Marty Heck, MD;  Location: [facility];  Service: Cardiovascular;  Laterality: N/A;  . APPENDECTOMY    . AV FISTULA PLACEMENT Right 06/26/2017   Procedure: ARTERIOVENOUS (AV) FISTULA CREATION VERSUS GRAFT RIGHT  ARM;  Surgeon: Conrad Homestead, MD;  Location: [facility];  Service: Vascular;  Laterality: Right;  . AV FISTULA PLACEMENT Right 10/31/2017   Procedure: INSERTION OF ARTERIOVENOUS (AV) ARTEGRAFT,  RIGHT UPPER ARM;  Surgeon: Conrad Broadwater, MD;  Location: [facility];  Service: Vascular;  Laterality: Right;  . AV FISTULA PLACEMENT Left 05/29/2018   Procedure: ARTERIOVENOUS (AV) FISTULA CREATION  LEFT UPPER EXTREMITY;  Surgeon: Waynetta Sandy, MD;  Location: [place];  Service: Vascular;  Laterality: Left;  . AV FISTULA PLACEMENT Left 08/09/2018   Procedure: INSERTION OF ARTERIOVENOUS (AV) GORE-TEX GRAFT LEFT UPPER ARM;  Surgeon: Waynetta Sandy, MD;  Location: [facility];  Service: Vascular;  Laterality: Left;  . CARDIAC DEFIBRILLATOR PLACEMENT  2010   St. Jude ICD  . CENTRAL LINE INSERTION Right 05/24/2018   Procedure: CENTRAL LINE INSERTION;  Surgeon: Marty Heck, MD;  Location: [facility];  Service: Cardiovascular;  Laterality: Right;  . COLECTOMY  ~ 2003   "for Crohn's" ascending colon.    . CORONARY STENT INTERVENTION N/A 11/25/2016   Procedure:  Coronary Stent Intervention;  Surgeon: Leonie Man, MD;  Location: [facility];  Service: Cardiovascular;  Laterality: N/A;  . EP IMPLANTABLE DEVICE N/A 09/14/2016   Procedure: ICD Generator Changeout;  Surgeon: Deboraha Sprang, MD;  Location: [facility];  Service: Cardiovascular;  Laterality: N/A;  . ESOPHAGOGASTRODUODENOSCOPY N/A 07/12/2017   Procedure: ESOPHAGOGASTRODUODENOSCOPY (EGD);  Surgeon: Jerene Bears, MD;  Location: [facility] ENDOSCOPY;  Service: Gastroenterology;  Laterality: N/A;  VAD pt so VAD team will need to accompany pt.   [name] FETAL SURGERY FOR CONGENITAL HERNIA     ????? pt has no knowledge of this..    . FISTULOGRAM Left 08/09/2018   Procedure: FISTULOGRAM LEFT ARM;  Surgeon: Waynetta Sandy, MD;  Location: [facility];  Service: Vascular;  Laterality: Left;  . INGUINAL HERNIA REPAIR    . INSERTION OF DIALYSIS CATHETER Right 06/26/2017   Procedure: INSERTION OF TUNNELED  DIALYSIS CATHETER RIGHT INTERNAL JUGULAR;  Surgeon: Conrad , MD;  Location: [facility];  Service: Vascular;  Laterality: Right;  . INSERTION OF IMPLANTABLE LEFT VENTRICULAR  ASSIST DEVICE N/A 06/12/2017   Procedure: INSERTION OF IMPLANTABLE LEFT VENTRICULAR ASSIST [place] 3;  Surgeon: Ivin Poot, MD;  Location: [place];  Service: Open Heart Surgery;  Laterality: N/A;  HM3 LVAD  CIRC ARREST  NITRIC OXIDE  . INSERTION OF IMPLANTABLE LEFT VENTRICULAR ASSIST DEVICE N/A 02/28/2018   Procedure: REDO INSERTION OF IMPLANTABLE LEFT VENTRICULAR ASSIST DEVICE - HEARTMATE 3;  Surgeon: Ivin Poot, MD;  Location: [place];  Service: Open Heart Surgery;  Laterality: N/A;  . IR REMOVAL TUN CV CATH W/O FL  02/26/2018  . IR THORACENTESIS ASP PLEURAL SPACE W/IMG GUIDE  06/28/2017  . LIGATION OF ARTERIOVENOUS  FISTULA Right 10/31/2017   Procedure: LIGATION OF BRACHIO-BASILIC VEIN TRANSPOSITION RIGHT ARM;  Surgeon: Conrad Escanaba, MD;  Location: [place];  Service: Vascular;  Laterality: Right;  . MULTIPLE  EXTRACTIONS WITH ALVEOLOPLASTY N/A 06/08/2017   Procedure: MULTIPLE EXTRACTION WITH ALVEOLOPLASTY AND PRE PROSTHETIC SURGERY AS NEEDED;  Surgeon: Lenn Cal, DDS;  Location: [place];  Service: Oral Surgery;  Laterality: N/A;  . RIGHT HEART CATH N/A 06/07/2017   Procedure: RIGHT HEART CATH;  Surgeon: Larey Dresser, MD;  Location: [facility];  Service: Cardiovascular;  Laterality: N/A;  . RIGHT HEART CATH N/A 02/08/2018   Procedure: RIGHT HEART CATH;  Surgeon: Larey Dresser, MD;  Location: [facility];  Service: Cardiovascular;  Laterality: N/A;  . RIGHT HEART CATH N/A 08/09/2018   Procedure: RIGHT HEART CATH;  Surgeon: Larey Dresser, MD;  Location: [facility];  Service: Cardiovascular;  Laterality: N/A;  . RIGHT/LEFT HEART CATH AND CORONARY ANGIOGRAPHY N/A 11/23/2016   Procedure: Right/Left Heart Cath and Coronary Angiography;  Surgeon: Larey Dresser, MD;  Location: [facility];  Service: Cardiovascular;  Laterality: N/A;  . TEE WITHOUT CARDIOVERSION N/A 06/12/2017   Procedure: TRANSESOPHAGEAL ECHOCARDIOGRAM (TEE);  Surgeon: Prescott Gum, Collier Salina, MD;  Location: [facility] [place];  Service: Open Heart Surgery;  Laterality: N/A;  . TEE WITHOUT CARDIOVERSION N/A 02/28/2018   Procedure: TRANSESOPHAGEAL ECHOCARDIOGRAM (TEE);  Surgeon: Prescott Gum, Collier Salina, MD;  Location: [facility];  Service: Open Heart Surgery;  Laterality: N/A;  . TEMPORARY DIALYSIS CATHETER Left 05/24/2018   Procedure: TEMPORARY DIALYSIS CATHETER;  Surgeon: Marty Heck, MD;  Location: [facility];  Service: Cardiovascular;  Laterality: Left;  . THROMBECTOMY AND REVISION OF ARTERIOVENTOUS (AV) GORETEX  GRAFT Right 12/12/2017   Procedure: THROMBECTOMY ARTERIOVENTOUS (AV) GORETEX  GRAFT RIGHT UPPER ARM;  Surgeon: Conrad Wellington, MD;  Location: [facility];  Service: Vascular;  Laterality: Right;        Home Medications    Prior to Admission medications   Medication Sig Start Date End Date Taking? Authorizing  Provider  amiodarone (PACERONE) 200 MG tablet Take 200 mg by mouth daily.    [provider]  aspirin EC 81 MG tablet Take 1 tablet (81 mg total) by mouth daily. 08/20/18   Eileen Stanford, PA-C  atorvastatin (LIPITOR) 40 MG tablet Take 1 tablet (40 mg total) by mouth daily at 6 PM. 01/25/18   [name], MD  calcitRIOL (ROCALTROL) 0.5 MCG capsule Take 1 capsule (0.5 mcg total) by mouth every Monday, Wednesday, and Friday. 06/06/18   Georgiana Shore, NP  citalopram (CELEXA) 20 MG tablet Take 1 tablet (20 mg total) by mouth daily. 03/19/18   Georgiana Shore, NP  docusate sodium (COLACE) 100 MG capsule Take 100 mg by mouth daily as  needed for mild constipation.    [provider]  dronabinol (MARINOL) 2.5 MG capsule Take 1 capsule (2.5 mg total) by mouth 2 (two) times daily before lunch and supper. 03/27/18   Larey Dresser, MD  erythromycin (E-MYCIN) 250 MG tablet Take 1 tablet (250 mg total) by mouth 3 (three) times daily. 03/25/18   Isaiah Serge, NP  insulin aspart (NOVOLOG) 100 UNIT/ML injection Inject 5 Units into the skin 3 (three) times daily with meals. 01/22/18   Georgiana Shore, NP  insulin glargine (LANTUS) 100 UNIT/ML injection Inject 0.1 mLs (10 Units total) into the skin daily. 05/09/18   Lovenia Kim, MD  isosorbide-hydrALAZINE (BIDIL) 20-37.5 MG tablet Take 1.5 tablets by mouth every 8 (eight) hours. Take 1.5 tablets every 8 hours on non dialysis days. Patient taking differently: Take 1 tablet by mouth daily.  03/19/18   Georgiana Shore, NP  lidocaine-prilocaine (EMLA) cream Apply 1 application topically as needed (to numb port).    [provider]  LORazepam (ATIVAN) 0.5 MG tablet Take 1 tablet (0.5 mg total) by mouth every 12 (twelve) hours as needed (nausea). 03/27/18   Larey Dresser, MD  metoCLOPramide (REGLAN) 10 MG tablet Take 1 tablet (10 mg total) by mouth 3 (three) times daily. 01/22/18   Georgiana Shore, NP  multivitamin (RENA-VIT)  TABS tablet Take 1 tablet by mouth daily.    [provider]  pantoprazole (PROTONIX) 40 MG tablet Take 40 mg by mouth 2 (two) times daily.     [provider]  promethazine (PHENERGAN) 12.5 MG tablet Take 1 tablet (12.5 mg total) by mouth every 6 (six) hours as needed for nausea or vomiting. 07/06/18   Larey Dresser, MD  sevelamer carbonate (RENVELA) 800 MG tablet Take 1,600-4,000 mg by mouth See admin instructions. 4010m three times daily with meals and 16072mtwice daily with snacks    [provider]  traMADol (ULTRAM) 50 MG tablet Take 1 tablet (50 mg total) by mouth every 6 (six) hours as needed for moderate pain. 08/18/18   ThEileen StanfordPA-C  warfarin (COUMADIN) 5 MG tablet Take 1 tablet (5 mg total) by mouth See admin instructions. Take 5 mg (1 tablet) daily 08/18/18   ThEileen StanfordPA-C    Family History Family History  Problem Relation Age of Onset  . Colon polyps Mother   . Diabetes Mother   . Heart attack Father   . Diabetes Father   . Stroke Paternal Grandfather   . Colitis Sister   . Heart disease Brother   . Colitis Sister   . Colon cancer Neg Hx     Social History Social History   Tobacco Use  . Smoking status: Never Smoker  . Smokeless tobacco: Never Used  Substance Use Topics  . Alcohol use: No  . Drug use: No     Allergies   Metformin and related   Review of Systems Review of Systems  Gastrointestinal: Positive for abdominal pain.  All other systems reviewed and are negative.    Physical Exam Updated Vital Signs BP 97/74   Pulse (!) 107   Temp 99.9 F (37.7 C) (Oral)   Resp (!) 24   Ht 5' 7"  (1.702 m)   Wt 80 kg   SpO2 96%   BMI 27.62 kg/m   Physical Exam Vitals signs and nursing note reviewed.  Constitutional:      Appearance: He is ill-appearing.     Comments: Chronically  ill-appearing male appears uncomfortable.  HENT:     Head: Atraumatic.     Comments: Lips are dry, mouth dry. Eyes:       Conjunctiva/sclera: Conjunctivae normal.  Neck:     Musculoskeletal: Neck supple.  Cardiovascular:     Comments: Mechanical humming sounds of LVAD Pulmonary:     Effort: Pulmonary effort is normal.     Breath sounds: Normal breath sounds. No wheezing.  Abdominal:     General: Abdomen is flat.     Tenderness: There is generalized abdominal tenderness.  Skin:    Findings: No rash.  Neurological:     Mental Status: He is alert and oriented to person, place, and time.      ED Treatments / Results  Labs (all labs ordered are listed, but only abnormal results are displayed) Labs Reviewed  CBC WITH DIFFERENTIAL/PLATELET - Abnormal; Notable for the following components:      Result Value   WBC 26.4 (*)    RBC 3.84 (*)    Hemoglobin 11.2 (*)    HCT 35.5 (*)    RDW 18.3 (*)    Neutro Abs 23.7 (*)    Monocytes Absolute 1.2 (*)    Abs Immature Granulocytes 0.27 (*)    All other components within normal limits  COMPREHENSIVE METABOLIC PANEL - Abnormal; Notable for the following components:   Potassium 3.4 (*)    Chloride 96 (*)    Glucose, Bld 162 (*)    Creatinine, Ser 5.11 (*)    GFR calc non Af Amer 12 (*)    GFR calc Af Amer 14 (*)    All other components within normal limits  LACTATE DEHYDROGENASE - Abnormal; Notable for the following components:   LDH 427 (*)    All other components within normal limits  PROTIME-INR - Abnormal; Notable for the following components:   Prothrombin Time 16.2 (*)    All other components within normal limits  LIPASE, BLOOD  URINALYSIS, ROUTINE W REFLEX MICROSCOPIC    EKG None  ED ECG REPORT   Date: 08/28/2018  Rate: 91  Rhythm: normal sinus rhythm  QRS Axis: right  Intervals: QT prolonged  ST/T Wave abnormalities: nonspecific ST changes  Conduction Disutrbances:none  Narrative Interpretation:   Old EKG Reviewed: unchanged  I have personally reviewed the EKG tracing and agree with the computerized printout as  noted.   Radiology No results found.  Procedures .Critical Care Performed by: Domenic Moras, PA-C Authorized by: Domenic Moras, PA-C   Critical care provider statement:    Critical care time (minutes):  45   Critical care was time spent personally by me on the following activities:  Discussions with consultants, evaluation of patient's response to treatment, examination of patient, ordering and performing treatments and interventions, ordering and review of laboratory studies, ordering and review of radiographic studies, pulse oximetry, re-evaluation of patient's condition, obtaining history from patient or surrogate and review of old charts   (including critical care time)  Medications Ordered in ED Medications  promethazine (PHENERGAN) injection 12.5 mg (12.5 mg Intravenous Given 08/28/18 1843)  LORazepam (ATIVAN) injection 1 mg (1 mg Intravenous Given 08/28/18 1843)     Initial Impression / Assessment and Plan / ED Course  I have reviewed the triage vital signs and the nursing notes.  Pertinent labs & imaging results that were available during my care of the patient were reviewed by me and considered in my medical decision making (see chart for details).  BP 97/74   Pulse (!) 107   Temp 99.9 F (37.7 C) (Oral)   Resp (!) 24   Ht 5' 7"  (1.702 m)   Wt 80 kg   SpO2 96%   BMI 27.62 kg/m     Final Clinical Impressions(s) / ED Diagnoses   Final diagnoses:  Intractable vomiting with nausea, unspecified vomiting type  Subtherapeutic international normalized ratio (INR)    ED Discharge Orders    None     6:39 PM Patient with history of Crohn's as well as dialysis patient here with nausea and vomiting and abdominal pain.  This symptom is reminiscent of his previous hospitalization 6 days ago when he was found to have elevated LDH as well as subtherapeutic INR.  He was treated by the cardiology team and was discharged 4 days ago.  Symptoms started again today.  Work-up  initiated. Care discussed with Dr. Venora Maples.   8:17 PM Patient has elevated white count of 26.4.  This is higher than prior value when he was admitted.  Abnormal renal function nearly baseline.  Elevated LDH of 427 as well as evidence of subtherapeutic INR at 1.32.  Abdominal pelvis CT scan is currently pending.  Cardiologist, Dr. Haroldine Laws is currently in the room to evaluate patient and will admit  8:32 PM Abdominal pelvis CT scan shows mild liquid stool within the colon suspicious for diarrhea or disease.  No other concerning feature.  Suspect viral GI causing symptoms.  Patient will be admitted.   Domenic Moras, PA-C 08/28/18 2033    Jola Schmidt, MD 08/28/18 2100

## 2018-08-28 NOTE — ED Triage Notes (Signed)
PT states diarrhea x 5, vomiting and abdominal pain after dialysis today.

## 2018-08-28 NOTE — ED Notes (Signed)
Attempted report 

## 2018-08-28 NOTE — Progress Notes (Signed)
PHARMACY NOTE:  ANTIMICROBIAL RENAL DOSAGE ADJUSTMENT  Current antimicrobial regimen includes a mismatch between antimicrobial dosage and estimated renal function.  As per policy approved by the Pharmacy & Therapeutics and Medical Executive Committees, the antimicrobial dosage will be adjusted accordingly.  Current antimicrobial dosage:  Cipro 250 mg bid  Indication: Diarrhea and GI Coverage  Renal Function:  Estimated Creatinine Clearance: 17 mL/min (A) (by C-G formula based on SCr of 5.11 mg/dL (H)). [x]      On intermittent HD, scheduled: []      On CRRT    Antimicrobial dosage has been changed to:  Cipro 500 mg every 24 hours at bedtime  Additional comments:  Thank you for allowing pharmacy to be a part of this patient's care.  Alycia Rossetti, PharmD, BCPS Clinical Pharmacist Please check AMION for all [facility] numbers 08/28/2018 9:10 PM

## 2018-08-28 NOTE — H&P (Signed)
Advanced Heart Failure VAD History and Physical Note   PCP-Cardiologist: No primary care provider on file.   Reason for Admission: Recurrent N?, Elevated LDH, Subtherapeutic INR  HPI:    [name] is a 52 y.o. malewith history of CAD s/p anterior MI in 2010 and NSTEMI in 3/18 with DES to pRCA and mLAD, diabetic, gastroparesis, Chrons disease, DMIIand ischemic cardiomyopathy, 10/2018s/p Heartmate 3 LVAD(emergent exchange 02/2018 due to outflow graft kink)and ESRD(HD MWF). He has LUE AVR 05/2018.  Admitted 08/08/18 for peri-op management for 2nd stage of AVF. Coumadin held and heparin bridge started when INR <1.8. He underwent US-guided cannulation of LUE AV fistula and found to be sclerotic and not suitable for dialysis. New LUE graft created from brachial artery to axillary vein. RHC completed as part of heart and kidney transplant work up for [employer]. Course complicated by fevers, requiring IV abx for HCAP and anemia, requiring blood transfusion. Coumadin resumed and bridged with heparin until INR >1.8.Unfortunately he developed spontaneous LUE hematoma. INR was supra therapeutic so coumadin was held for a few days. VVS followed closely. LUE hematoma gradually improved with reduced edema and pain. VSS recommended conservative with management so coumadin was resumed. Once his pain was controlled he was discharged to home.   Admitted 12/18-23/19 with recurrent N/V from HD. Treated in ED with Ativan and Phenergan. LDH elevated (403) on arrival with subtherapeutic INR (1.70). It was thought that LDH elevation was most likely from his recent LUE graft and subsequent hematoma. Covered with heparin while awaiting therapeutic INR. VVS following and recommended following over the weekend and if hand pain not improved to attempt graft ligation. VVS and OT saw again and recommended OT as outpatient and continuing to follow left hand strength. Hospital course complicated by altered mental status in  setting of multiple doses of phenergan and Ativan. Head CT 08/22/18 showed no changes.   LDH trended down on heparin and with adjustment of his INR. Not thought to be due to pump thrombosis. Discharged yesterday.   Was feeling fine today. Tolerated HD well. But soon after getting home developed intractable n/d and watery diarrhea. Unable to hold any pills down and lost suppositry due to diarrhea. No f/c or ab pain. Came to ER and began feeling better. However INR low again at 1.32 and LDH elevated at 427  so admitted for [facility].   Ab CT in ER: 1. Mild liquid stool within the colon suspicious for diarrheal disease. Right hemicolectomy. No bowel obstruction or inflammation. 2. Aortoiliac atherosclerosis.  LVAD Interrogation HM 3: Speed: 5600 Flow: 4.2 PI: 3.9 Power: 4.4. ~10 PI events today.  Review of Systems: [y] = yes, [ ]  = no    General: Weight gain [] ; Weight loss [ ] ; Anorexia [ ] ; Fatigue [y]; Fever [ ] ; Chills [ ] ; Weakness Blue.Reese ]   Cardiac: Chest pain/pressure [ ] ; Resting SOB [ y]; Exertional SOB [] ; Orthopnea [ ] ; Pedal Edema [] ; Palpitations [ ] ; Syncope [ ] ; Presyncope [ ] ; Paroxysmal nocturnal dyspnea[ ]    Pulmonary: Cough [ ] ; Wheezing[ ] ; Hemoptysis[ ] ; Sputum [ ] ; Snoring [ ]    GI: Vomiting[y ]; Dysphagia[ ] ; Melena[ ] ; Hematochezia [ ] ; Heartburn[ ] ; Abdominal pain [ ] ; Constipation [ ] ; Diarrhea Blue.Reese ]; BRBPR [ ]    GU: Hematuria[ ] ; Dysuria [ ] ; Nocturia[ ]   Vascular: Pain in legs with walking [ ] ; Pain in feet with lying flat [ ] ; Non-healing sores [ ] ; Stroke [ ] ; TIA [ ] ; Slurred  speech [ ] ;   Neuro: Headaches[ ] ; Vertigo[ ] ; Seizures[ ] ; Paresthesias[ ] ;Blurred vision [ ] ; Diplopia [ ] ; Vision changes [ ]    Ortho/Skin: Arthritis [y]; Joint pain [y]; Muscle pain [ ] ; Joint swelling [ ] ; Back Pain [ ] ; Rash [ ]    Psych: Depression[ y]; Anxiety[ y]   Heme: Bleeding problems [ ] ; Clotting disorders [ ] ; Anemia Blue.Reese ]   Endocrine: Diabetes [ y]; Thyroid  dysfunction[ ]    Home Medications Prior to Admission medications   Medication Sig Start Date End Date Taking? Authorizing Provider  amiodarone (PACERONE) 200 MG tablet Take 200 mg by mouth daily.    [provider]  aspirin EC 81 MG tablet Take 1 tablet (81 mg total) by mouth daily. 08/20/18   Eileen Stanford, PA-C  atorvastatin (LIPITOR) 40 MG tablet Take 1 tablet (40 mg total) by mouth daily at 6 PM. 01/25/18   [name], MD  calcitRIOL (ROCALTROL) 0.5 MCG capsule Take 1 capsule (0.5 mcg total) by mouth every Monday, Wednesday, and Friday. 06/06/18   Georgiana Shore, NP  citalopram (CELEXA) 20 MG tablet Take 1 tablet (20 mg total) by mouth daily. 03/19/18   Georgiana Shore, NP  docusate sodium (COLACE) 100 MG capsule Take 100 mg by mouth daily as needed for mild constipation.    [provider]  dronabinol (MARINOL) 2.5 MG capsule Take 1 capsule (2.5 mg total) by mouth 2 (two) times daily before lunch and supper. 03/27/18   Larey Dresser, MD  erythromycin (E-MYCIN) 250 MG tablet Take 1 tablet (250 mg total) by mouth 3 (three) times daily. 03/25/18   Isaiah Serge, NP  insulin aspart (NOVOLOG) 100 UNIT/ML injection Inject 5 Units into the skin 3 (three) times daily with meals. 01/22/18   Georgiana Shore, NP  insulin glargine (LANTUS) 100 UNIT/ML injection Inject 0.1 mLs (10 Units total) into the skin daily. 05/09/18   Lovenia Kim, MD  isosorbide-hydrALAZINE (BIDIL) 20-37.5 MG tablet Take 1.5 tablets by mouth every 8 (eight) hours. Take 1.5 tablets every 8 hours on non dialysis days. Patient taking differently: Take 1 tablet by mouth daily.  03/19/18   Georgiana Shore, NP  lidocaine-prilocaine (EMLA) cream Apply 1 application topically as needed (to numb port).    [provider]  LORazepam (ATIVAN) 0.5 MG tablet Take 1 tablet (0.5 mg total) by mouth every 12 (twelve) hours as needed (nausea). 03/27/18   Larey Dresser, MD  metoCLOPramide (REGLAN) 10  MG tablet Take 1 tablet (10 mg total) by mouth 3 (three) times daily. 01/22/18   Georgiana Shore, NP  multivitamin (RENA-VIT) TABS tablet Take 1 tablet by mouth daily.    [provider]  pantoprazole (PROTONIX) 40 MG tablet Take 40 mg by mouth 2 (two) times daily.     [provider]  promethazine (PHENERGAN) 12.5 MG tablet Take 1 tablet (12.5 mg total) by mouth every 6 (six) hours as needed for nausea or vomiting. 07/06/18   Larey Dresser, MD  sevelamer carbonate (RENVELA) 800 MG tablet Take 1,600-4,000 mg by mouth See admin instructions. 4038m three times daily with meals and 16074mtwice daily with snacks    [provider]  traMADol (ULTRAM) 50 MG tablet Take 1 tablet (50 mg total) by mouth every 6 (six) hours as needed for moderate pain. 08/18/18   ThEileen StanfordPA-C  warfarin (COUMADIN) 5 MG tablet Take 1 tablet (5 mg total) by mouth See  admin instructions. Take 5 mg (1 tablet) daily 08/18/18   Eileen Stanford, PA-C    Past Medical History: Past Medical History:  Diagnosis Date  . Angina   . ASCVD (arteriosclerotic cardiovascular disease)    , Anterior infarction 2005, LAD diagonal bifurcation intervention 03/2004  . Automatic implantable cardiac defibrillator -St. Jude's       . Benign neoplasm of colon   . CHF (congestive heart failure) ([place])   . Chronic systolic heart failure ([place])   . Coronary artery disease     Widely patent previously placed stents in the left anterior   . Crohn's disease ([place])   . 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 Dialysis patient ([place])   . Dyspnea   . Gastroparesis   . GERD (gastroesophageal reflux disease)   . High cholesterol   . Hyperlipidemia   . Hypersomnolent    Previous diagnosis of narcolepsy  . Hypertension, essential   . Ischemic cardiomyopathy    Ejection fraction 15-20% catheterization 2010  . Type II or unspecified type diabetes mellitus without mention of complication, not  stated as uncontrolled   . Unspecified gastritis and gastroduodenitis without mention of hemorrhage     Past Surgical History: Past Surgical History:  Procedure Laterality Date  . A/V SHUNTOGRAM N/A 05/24/2018   Procedure: A/V SHUNTOGRAM;  Surgeon: Marty Heck, MD;  Location: [place] [facility];  Service: Cardiovascular;  Laterality: N/A;  . APPENDECTOMY    . AV FISTULA PLACEMENT Right 06/26/2017   Procedure: ARTERIOVENOUS (AV) FISTULA CREATION VERSUS GRAFT RIGHT  ARM;  Surgeon: Conrad [place] Park, MD;  Location: [place];  Service: Vascular;  Laterality: Right;  . AV FISTULA PLACEMENT Right 10/31/2017   Procedure: INSERTION OF ARTERIOVENOUS (AV) ARTEGRAFT,  RIGHT UPPER ARM;  Surgeon: Conrad Butterfield, MD;  Location: [place];  Service: Vascular;  Laterality: Right;  . AV FISTULA PLACEMENT Left 05/29/2018   Procedure: ARTERIOVENOUS (AV) FISTULA CREATION  LEFT UPPER EXTREMITY;  Surgeon: Waynetta Sandy, MD;  Location: [place];  Service: Vascular;  Laterality: Left;  . AV FISTULA PLACEMENT Left 08/09/2018   Procedure: INSERTION OF ARTERIOVENOUS (AV) GORE-TEX GRAFT LEFT UPPER ARM;  Surgeon: Waynetta Sandy, MD;  Location: [place];  Service: Vascular;  Laterality: Left;  . CARDIAC DEFIBRILLATOR PLACEMENT  2010   St. Jude ICD  . CENTRAL LINE INSERTION Right 05/24/2018   Procedure: CENTRAL LINE INSERTION;  Surgeon: Marty Heck, MD;  Location: [place] [facility];  Service: Cardiovascular;  Laterality: Right;  . COLECTOMY  ~ 2003   "for Crohn's" ascending colon.    . CORONARY STENT INTERVENTION N/A 11/25/2016   Procedure: Coronary Stent Intervention;  Surgeon: Leonie Man, MD;  Location: [place] [facility];  Service: Cardiovascular;  Laterality: N/A;  . EP IMPLANTABLE DEVICE N/A 09/14/2016   Procedure: ICD Generator Changeout;  Surgeon: Deboraha Sprang, MD;  Location: [place] [facility];  Service: Cardiovascular;  Laterality: N/A;  . ESOPHAGOGASTRODUODENOSCOPY N/A 07/12/2017    Procedure: ESOPHAGOGASTRODUODENOSCOPY (EGD);  Surgeon: Jerene Bears, MD;  Location[facility] END[facility];  Service: Gastroenterology;  Laterality: N/A;  VAD pt so VAD team will need to accompany pt.   [name] FETAL SURGERY FOR CONGENITAL HERNIA     ????? pt has no knowledge of this..    . FISTULOGRAM Left 08/09/2018   Procedure: FISTULOGRAM LEFT ARM;  Surgeon: Waynetta Sandy, MD;  Location: [place];  Service: Vascular;  Laterality: Left;  .  INGUINAL HERNIA REPAIR    . INSERTION OF DIALYSIS CATHETER Right 06/26/2017   Procedure: INSERTION OF TUNNELED  DIALYSIS CATHETER RIGHT INTERNAL JUGULAR;  Surgeon: Conrad Proctor, MD;  Location: [place];  Service: Vascular;  Laterality: Right;  . INSERTION OF IMPLANTABLE LEFT VENTRICULAR ASSIST DEVICE N/A 06/12/2017   Procedure: INSERTION OF IMPLANTABLE LEFT VENTRICULAR ASSIST [place] 3;  Surgeon: Ivin Poot, MD;  Location: [place];  Service: Open Heart Surgery;  Laterality: N/A;  HM3 LVAD  CIRC ARREST  NITRIC OXIDE  . INSERTION OF IMPLANTABLE LEFT VENTRICULAR ASSIST DEVICE N/A 02/28/2018   Procedure: REDO INSERTION OF IMPLANTABLE LEFT VENTRICULAR ASSIST DEVICE - HEARTMATE 3;  Surgeon: Ivin Poot, MD;  Location: [place];  Service: Open Heart Surgery;  Laterality: N/A;  . IR REMOVAL TUN CV CATH W/O FL  02/26/2018  . IR THORACENTESIS ASP PLEURAL SPACE W/IMG GUIDE  06/28/2017  . LIGATION OF ARTERIOVENOUS  FISTULA Right 10/31/2017   Procedure: LIGATION OF BRACHIO-BASILIC VEIN TRANSPOSITION RIGHT ARM;  Surgeon: Conrad Will[place] River, MD;  Location: [place];  Service: Vascular;  Laterality: Right;  . MULTIPLE EXTRACTIONS WITH ALVEOLOPLASTY N/A 06/08/2017   Procedure: MULTIPLE EXTRACTION WITH ALVEOLOPLASTY AND PRE PROSTHETIC SURGERY AS NEEDED;  Surgeon: Lenn Cal, DDS;  Location: [place];  Service: Oral Surgery;  Laterality: N/A;  . RIGHT HEART CATH N/A 06/07/2017   Procedure: RIGHT HEART CATH;  Surgeon: Larey Dresser, MD;  Location: [place] [facility];   Service: Cardiovascular;  Laterality: N/A;  . RIGHT HEART CATH N/A 02/08/2018   Procedure: RIGHT HEART CATH;  Surgeon: Larey Dresser, MD;  Location: [place] [facility];  Service: Cardiovascular;  Laterality: N/A;  . RIGHT HEART CATH N/A 08/09/2018   Procedure: RIGHT HEART CATH;  Surgeon: Larey Dresser, MD;  Location: [place] [facility];  Service: Cardiovascular;  Laterality: N/A;  . RIGHT/LEFT HEART CATH AND CORONARY ANGIOGRAPHY N/A 11/23/2016   Procedure: Right/Left Heart Cath and Coronary Angiography;  Surgeon: Larey Dresser, MD;  Location: [place] [facility];  Service: Cardiovascular;  Laterality: N/A;  . TEE WITHOUT CARDIOVERSION N/A 06/12/2017   Procedure: TRANSESOPHAGEAL ECHOCARDIOGRAM (TEE);  Surgeon: Prescott Gum, Collier [place]ina, MD;  Location: [place];  Service: Open Heart Surgery;  Laterality: N/A;  . TEE WITHOUT CARDIOVERSION N/A 02/28/2018   Procedure: TRANSESOPHAGEAL ECHOCARDIOGRAM (TEE);  Surgeon: Prescott Gum, Collier [place]ina, MD;  Location: [place];  Service: Open Heart Surgery;  Laterality: N/A;  . TEMPORARY DIALYSIS CATHETER Left 05/24/2018   Procedure: TEMPORARY DIALYSIS CATHETER;  Surgeon: Marty Heck, MD;  Location: [place] [facility];  Service: Cardiovascular;  Laterality: Left;  . THROMBECTOMY AND REVISION OF ARTERIOVENTOUS (AV) GORETEX  GRAFT Right 12/12/2017   Procedure: THROMBECTOMY ARTERIOVENTOUS (AV) GORETEX  GRAFT RIGHT UPPER ARM;  Surgeon: Conrad Downey, MD;  Location: [facility];  Service: Vascular;  Laterality: Right;    Family History: Family History  Problem Relation Age of Onset  . Colon polyps Mother   . Diabetes Mother   . Heart attack Father   . Diabetes Father   . Stroke Paternal Grandfather   . Colitis Sister   . Heart disease Brother   . Colitis Sister   . Colon cancer Neg Hx     Social History: Social History   Socioeconomic History  . Marital status: Married    Spouse name: Not on file  . Number of children: Not on file  . Years of education: Not on file  .  Highest education level: Not on file  Occupational History  . Not on file  Social Needs  . Financial resource strain: Not on file  . Food insecurity:    Worry: Not on file    Inability: Not on file  . Transportation needs:    Medical: Not on file    Non-medical: Not on file  Tobacco Use  . Smoking status: Never Smoker  . Smokeless tobacco: Never Used  Substance and Sexual Activity  . Alcohol use: No  . Drug use: No  . Sexual activity: Never  Lifestyle  . Physical activity:    Days per week: Not on file    Minutes per session: Not on file  . Stress: Not on file  Relationships  . Social connections:    Talks on phone: Not on file    Gets together: Not on file    Attends religious service: Not on file    Active member of club or organization: Not on file    Attends meetings of clubs or organizations: Not on file    Relationship status: Not on file  Other Topics Concern  . Not on file  Social History Narrative  . Not on file    Allergies:  Allergies  Allergen Reactions  . Metformin And Related Diarrhea    Objective:    Vital Signs:   Vitals:   08/28/18 1833 08/28/18 1856 08/28/18 1857 08/28/18 1930  BP:   (!) 129/113 97/74  Pulse:  98  (!) 107  Resp:    (!) 24  Temp:  99.9 F (37.7 C)    TempSrc:  Oral    SpO2:  97%  96%  Weight: 80 kg     Height: 5' 7"  (1.702 m)         Filed Weights   08/28/18 1833  Weight: 80 kg    Mean arterial Pressure 80 Personally reviewed   Physical Exam    General:  Weak appearing NAD HEENT: normal  Neck: supple. JVP 8-9  Carotids 2+ bilat; no bruits. No lymphadenopathy or thryomegaly appreciated. Cor: LVAD hum.  LU chest perm cath Lungs: Clear. Abdomen: obese soft, nontender, non-distended. No hepatosplenomegaly. No bruits or masses. Good bowel sounds. Driveline site clean. Anchor in place.  Extremities: no cyanosis, clubbing, rash. Warm no edema  LUE hematoma at graft site. No thrill Neuro: alert & oriented x 3. No  focal deficits. Moves all 4 without problem    Telemetry   NSR 80s, personally reviewed.  EKG   No new tracings.    Labs    Basic Metabolic Panel: Recent Labs  Lab 08/24/18 0333 08/25/18 0621 08/26/18 0301 08/27/18 0711 08/28/18 1839  NA 135 136 137 132* 135  K 4.4 3.6 3.9 4.2 3.4*  CL 98 96* 100 98 96*  CO2 23 25 22 25 24   GLUCOSE 61* 107* 79 244* 162*  BUN 39* 17 24* 18 15  CREATININE 8.42* 6.23* 8.39* 6.07* 5.11*  CALCIUM 8.6* 8.4* 8.3* 7.8* 9.5    Liver Function Tests: Recent Labs  Lab 08/22/18 1122 08/28/18 1839  AST 45* 35  ALT 19 21  ALKPHOS 116 120  BILITOT 1.1 1.2  PROT 7.6 8.1  ALBUMIN 3.1* 3.6   Recent Labs  Lab 08/28/18 1839  LIPASE 30   No results for input(s): AMMONIA in the last 168 hours.  CBC: Recent Labs  Lab 08/22/18 1122  08/24/18 0333 08/25/18 0621 08/26/18 0301 08/27/18 0711 08/28/18 1839  WBC 15.9*   < >  16.8* 12.6* 15.3* 14.0* 26.4*  NEUTROABS 13.8*  --   --   --   --   --  23.7*  HGB 9.7*   < > 8.5* 9.3* 8.9* 8.5* 11.2*  HCT 31.1*   < > 27.2* 29.1* 28.2* 27.9* 35.5*  MCV 89.9   < > 89.5 92.4 93.1 94.9 92.4  PLT 315   < > 338 374 359 310 333   < > = values in this interval not displayed.    Cardiac Enzymes: No results for input(s): CKTOTAL, CKMB, CKMBINDEX, TROPONINI in the last 168 hours.  BNP: BNP (last 3 results) Recent Labs    03/02/18 0400 03/08/18 0008 03/15/18 0106  BNP 694.1* 1,576.6* 811.3*    ProBNP (last 3 results) No results for input(s): PROBNP in the last 8760 hours.   CBG: Recent Labs  Lab 08/26/18 1216 08/26/18 1609 08/26/18 2101 08/27/18 0745 08/27/18 1208  GLUCAP 143* 246* 190* 208* 154*    Coagulation Studies: Recent Labs    08/26/18 0301 08/27/18 0711 08/28/18 1839  LABPROT 33.5* 26.5* 16.2*  INR 3.36 2.49 1.32   Imaging    Ct Abdomen Pelvis Wo Contrast  Result Date: 08/28/2018 CLINICAL DATA:  History of Crohn's in dialysis recurrent nausea and vomiting. EXAM: CT  ABDOMEN AND PELVIS WITHOUT CONTRAST TECHNIQUE: Multidetector CT imaging of the abdomen and pelvis was performed following the standard protocol without IV contrast. COMPARISON:  03/13/2018 FINDINGS: Lower chest: Partially included left ventricular assist device is identified with stable cardiomegaly. Subpleural atelectasis and/or scarring is seen at each lung base. No effusion or pneumothorax. Hepatobiliary: No focal liver abnormality is seen. No gallstones, gallbladder wall thickening, or biliary dilatation. Pancreas: Unremarkable. No pancreatic ductal dilatation or surrounding inflammatory changes. Spleen: Normal in size without focal abnormality. Adrenals/Urinary Tract: Normal bilateral adrenal glands. No nephrolithiasis nor obstructive uropathy. Urinary bladder is partially decompressed in appearance but unremarkable. Stomach/Bowel: Mild liquid stool within the colon suspicious for diarrheal disease. Right colectomy with chain sutures noted in the right hemiabdomen. No bowel obstruction. Moderate distention the stomach with ingested food. Normal small bowel rotation is noted. Vascular/Lymphatic: Aortoiliac atherosclerosis. No lymphadenopathy. Reproductive: Size prostate. Other: Free air nor free fluid. Periumbilical fat containing hernia. Musculoskeletal: Midline ventral scarring. A percutaneous catheter is noted from right lower quadrant approach with tip extending to the left upper quadrant. This is partially obscured by streak artifacts from the patient's left ventricular assist device. IMPRESSION: 1. Mild liquid stool within the colon suspicious for diarrheal disease. Right hemicolectomy. No bowel obstruction or inflammation. 2. Aortoiliac atherosclerosis. Electronically Signed   By: Ashley Royalty M.D.   On: 08/28/2018 20:26     Patient Profile:   [name] is a 52 y.o. malewith history of CAD s/p anterior MI in 2010 and NSTEMI in 3/18 with DES to pRCA and mLAD, diabetic, gastroparesis, Chrons  disease, DMIIand ischemic cardiomyopathy, 10/2018s/p Heartmate 3 LVAD(emergent exchange 02/2018 due to outflow graft kink)and ESRD(HD MWF). He has LUE AVR 05/2018.  Assessment/Plan:    1. Recurrent nauseas/vomitting due to diabetic gastroparesis: Difficult to manage. Now with very frequent readmits. Also had component of diarrhea today but doubt c. Diff. Ab CT essentially unremarkable in ER. He has seen gastroparesis specialist at [facility]. Surprisingly, gastric emptying study was normal. However, electrogastrogram showed poor gastric accomodation/capacity. Therefore, small frequent meals recommended. Also morning nausea thought to be GERD and Protonix was increased.  - Has watery diarrhea with WBC 26.4k. Stool does not seem like c.diff. Will  get blood and stool culture.  - Hydrate gently with 500cc - Empiric cipro 250 bid x 5 days - Continue reglan 5 mg tid/ac, chronic erythromycin, and Marinol.  - Continue Protonix BID.   - Continue to use phenergan prn. Works much better than Zofran.  - Make effort to eat small, more frequent meals.   2.. Elevated LDH and Subtherapeutic INR - INR 1.32. Will use heparin and follow closely.  - LDH back up to 427. Doubt pump clot. Not dark urine or power spikes. Start heparin  3. Chronic systolic CHF s/p HM-3 VAD: Ischemic cardiomyopathy. St Jude ICD. NSTEMI in March 2018 with DES to LAD and RCA, complicated by cardiogenic shock, low output requiring milrinone. Echo 8/18 with EF 20-25%, moderate MR. CPX (8/18) with severe functional impairment due to HF. He is s/p HM3 LVAD placement. He had pump thrombosis 6/19 due to [facility] outflow graft kinking. He had pump exchange for another HM3.RHC showed low filling pressures and good cardiac output 08/09/18   - Recently dry weight increased. This seems to be helping (no low flows with HD this time in the hospital).   - Volume status ok - Continue Bidil 1 tabTIDon non-HD days.  - VAD interrogated  personally. Parameters stable. - INR 1.32. Goal INR 2-2.5. Will start heparin with elevated LDH.  - Continue ASA.  - He is now listed [facility] heart[facility]kid[facility]. [place] 08/09/18 with optimized filling pressures and cardiac output. Normal PA pressure.   4. ESRD: Developed peri-operatively after HM3 placement. He is tolerating HD, currently via catheter.  Hematoma post-AV LUE graft placement. Graft now nonfunctional    -Coumadin was restarted 12/12. INR 1.7. Goal 2.0 - 2.5. Will start heparin.  - MWF HD but had HD today. Will alert Renal in am.   5. LUE hematoma - resolving  6. CAD: NSTEMI in 3/18. LHC with 99% ulcerated lesion proximal RCA with left to right collaterals, 95% mid LAD stenosis after mid LAD stent. s/p PCI to RCA and LAD on 11/25/16.  - No s/s of ischemia.    - Continue statin.  7. DM: HgbA1c well-controlled No change. SSI while in house.   8. HTN:  - Continue hydralazine on non HD days.  - MAP 80 today  9. Anemia: Received 1PURBc 12/9.  - Hgb 11.2 today   I reviewed the LVAD parameters from today, and compared the results to the patient's prior recorded data.  No programming changes were made.  The LVAD is functioning within specified parameters.  The patient performs LVAD self-test daily.  LVAD interrogation was negative for any significant power changes, alarms or PI events/speed drops.  LVAD equipment check completed and is in good working order.  Back-up equipment present.   LVAD education done on emergency procedures and precautions and reviewed exit site care.  Length of Stay: 0  [name], MD 08/28/2018, 8:35 PM  VAD Team Pager [phone] (7am - 7am) +++VAD ISSUES ONLY+++   Advanced Heart Failure Team Pager [phone] (M-F; [place])  Please contact [facility] for night-coverage after hours (4p -7a ) and weekends on [email or web address] for all non-

## 2018-08-28 NOTE — ED Notes (Signed)
Patient transported to CT 

## 2018-08-28 NOTE — Progress Notes (Signed)
ANTICOAGULATION CONSULT NOTE - Initial Consult  Pharmacy Consult for heparin/warfarin Indication: LVAD/Factor 5 Leiden  Allergies  Allergen Reactions  . Metformin And Related Diarrhea    Patient Measurements: Height: 5' 7"  (170.2 cm) Weight: 176 lb 5.9 oz (80 kg) IBW/kg (Calculated) : 66.1  Vital Signs: Temp: 99.9 F (37.7 C) (12/24 1856) Temp Source: Oral (12/24 1856) BP: 97/74 (12/24 1930) Pulse Rate: 107 (12/24 1930)  Labs: Recent Labs    08/26/18 0301 08/27/18 0711 08/28/18 1839  HGB 8.9* 8.5* 11.2*  HCT 28.2* 27.9* 35.5*  PLT 359 310 333  LABPROT 33.5* 26.5* 16.2*  INR 3.36 2.49 1.32  CREATININE 8.39* 6.07* 5.11*    Estimated Creatinine Clearance: 17 mL/min (A) (by C-G formula based on SCr of 5.11 mg/dL (H)).   Medical History: Past Medical History:  Diagnosis Date  . Angina   . ASCVD (arteriosclerotic cardiovascular disease)    , Anterior infarction 2005, LAD diagonal bifurcation intervention 03/2004  . Automatic implantable cardiac defibrillator -[employer]. Jude's       . Benign neoplasm of colon   . CHF (congestive heart failure) ([place])   . Chronic systolic heart failure ([place])   . Coronary artery disease     Widely patent previously placed stents in the left anterior   . Crohn's disease ([place])   . 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 Dialysis patient ([place])   . Dyspnea   . Gastroparesis   . GERD (gastroesophageal reflux disease)   . High cholesterol   . Hyperlipidemia   . Hypersomnolent    Previous diagnosis of narcolepsy  . Hypertension, essential   . Ischemic cardiomyopathy    Ejection fraction 15-20% catheterization 2010  . Type II or unspecified type diabetes mellitus without mention of complication, not stated as uncontrolled   . 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  INR on admission subtherapeutic at 1.32, last dose 12/23.  Recently admitted with a therapeutic heparin rate of 1550 units/hr which we will start.      PTA warfarin dosing = 5m daily except 7.516mon Mondays  Goal of Therapy:  INR 2-3 Heparin level 0.3-0.7 units/ml Monitor platelets by anticoagulation protocol: Yes   Plan:  Warfarin 5 mg PO x 1 tonight Start heparin drip at 1550 units/hr F/u 8 hour heparin level Daily INR, heparin level, CBC, s/s bleeding  JoBertis RuddyPharmD Clinical Pharmacist Please check AMION for all MCDunedinumbers 08/28/2018 9:07 PM

## 2018-08-29 ENCOUNTER — Inpatient Hospital Stay (HOSPITAL_COMMUNITY): Payer: BLUE CROSS/BLUE SHIELD

## 2018-08-29 ENCOUNTER — Other Ambulatory Visit: Payer: Self-pay

## 2018-08-29 LAB — CBC
HEMATOCRIT: 31.8 % — AB (ref 39.0–52.0)
Hemoglobin: 10 g/dL — ABNORMAL LOW (ref 13.0–17.0)
MCH: 29.6 pg (ref 26.0–34.0)
MCHC: 31.4 g/dL (ref 30.0–36.0)
MCV: 94.1 fL (ref 80.0–100.0)
Platelets: 332 10*3/uL (ref 150–400)
RBC: 3.38 MIL/uL — ABNORMAL LOW (ref 4.22–5.81)
RDW: 17.8 % — ABNORMAL HIGH (ref 11.5–15.5)
WBC: 24.8 10*3/uL — ABNORMAL HIGH (ref 4.0–10.5)
nRBC: 0 % (ref 0.0–0.2)

## 2018-08-29 LAB — LACTATE DEHYDROGENASE: LDH: 373 U/L — ABNORMAL HIGH (ref 98–192)

## 2018-08-29 LAB — BASIC METABOLIC PANEL
Anion gap: 17 — ABNORMAL HIGH (ref 5–15)
BUN: 24 mg/dL — ABNORMAL HIGH (ref 6–20)
CO2: 20 mmol/L — ABNORMAL LOW (ref 22–32)
Calcium: 8.5 mg/dL — ABNORMAL LOW (ref 8.9–10.3)
Chloride: 97 mmol/L — ABNORMAL LOW (ref 98–111)
Creatinine, Ser: 6.12 mg/dL — ABNORMAL HIGH (ref 0.61–1.24)
GFR calc Af Amer: 11 mL/min — ABNORMAL LOW (ref >60)
GFR calc non Af Amer: 10 mL/min — ABNORMAL LOW (ref >60)
Glucose, Bld: 252 mg/dL — ABNORMAL HIGH (ref 70–99)
Potassium: 3.9 mmol/L (ref 3.5–5.1)
SODIUM: 134 mmol/L — AB (ref 135–145)

## 2018-08-29 LAB — PROTIME-INR
INR: 1.37
Prothrombin Time: 16.7 seconds — ABNORMAL HIGH (ref 11.4–15.2)

## 2018-08-29 LAB — GLUCOSE, CAPILLARY
Glucose-Capillary: 244 mg/dL — ABNORMAL HIGH (ref 70–99)
Glucose-Capillary: 159 mg/dL — ABNORMAL HIGH (ref 70–99)
Glucose-Capillary: 122 mg/dL — ABNORMAL HIGH (ref 70–99)

## 2018-08-29 MED ORDER — INSULIN ASPART 100 UNIT/ML ~~LOC~~ SOLN
0.0000 [IU] | Freq: Three times a day (TID) | SUBCUTANEOUS | Status: DC
  Administered 2018-08-29 – 2018-08-30 (×3): 3 [IU] via SUBCUTANEOUS
  Administered 2018-08-31: 5 [IU] via SUBCUTANEOUS
  Administered 2018-09-01: 2 [IU] via SUBCUTANEOUS
  Administered 2018-09-01 – 2018-09-02 (×3): 3 [IU] via SUBCUTANEOUS
  Administered 2018-09-02: 2 [IU] via SUBCUTANEOUS

## 2018-08-29 MED ORDER — HEPARIN (PORCINE) 25000 UT/250ML-% IV SOLN
1350.0000 [IU]/h | INTRAVENOUS | Status: DC
  Administered 2018-08-29: 1550 [IU]/h via INTRAVENOUS
  Filled 2018-08-29: qty 250

## 2018-08-29 MED ORDER — INSULIN ASPART 100 UNIT/ML ~~LOC~~ SOLN: 0.0000 [IU] | Freq: Every day | SUBCUTANEOUS | Status: DC

## 2018-08-29 MED ORDER — LORAZEPAM 2 MG/ML IJ SOLN
0.5000 mg | Freq: Three times a day (TID) | INTRAMUSCULAR | Status: DC | PRN
  Administered 2018-08-29 – 2018-09-01 (×5): 0.5 mg via INTRAVENOUS
  Filled 2018-08-29 (×5): qty 1

## 2018-08-29 MED ORDER — HYDRALAZINE HCL 20 MG/ML IJ SOLN
10.0000 mg | INTRAMUSCULAR | Status: DC | PRN
  Administered 2018-08-31: 10 mg via INTRAVENOUS
  Filled 2018-08-29: qty 1

## 2018-08-29 MED ORDER — WARFARIN SODIUM 5 MG PO TABS
5.0000 mg | ORAL_TABLET | Freq: Once | ORAL | Status: AC
  Administered 2018-08-29: 5 mg via ORAL
  Filled 2018-08-29: qty 1

## 2018-08-29 NOTE — Procedures (Signed)
Central Venous Catheter Insertion Procedure Note [name] [id number] 08/21/1966  Procedure: Insertion of Central Venous Catheter Indications: Lack of IV access  Procedure Details Consent: Risks of procedure as well as the alternatives and risks of each were explained to the (patient/caregiver).  Consent for procedure obtained. Time Out: Verified patient identification, verified procedure, site/side was marked, verified correct patient position, special equipment/implants available, medications/allergies/relevent history reviewed, required imaging and test results available.  Performed  Maximum sterile technique was used including antiseptics, cap, gloves, gown, hand hygiene, mask and sheet. Skin prep: Chlorhexidine; local anesthetic administered A antimicrobial bonded/coated triple lumen catheter was placed in the right internal jugular vein using the Seldinger technique and u/s guidance. The procedure was difficult due to significant scar tissue which bent the dilator despite a generous skin incision  Evaluation Blood flow good Complications: No apparent complications Patient did tolerate procedure well. Chest X-ray ordered to verify placement.  CXR: normal.  [name] 08/29/2018, 5:35 PM

## 2018-08-29 NOTE — Progress Notes (Signed)
Telephone confirmation by Dr. Haroldine Laws that Central Line is okay to use.

## 2018-08-29 NOTE — Progress Notes (Signed)
Advanced Heart Failure VAD Team Note  PCP-Cardiologist: No primary care provider on file.   Subjective:    Still with some nausea but looks more comfortable. Says he had some more diarrhea this am but not much. No fevers or chills.   On heparin. No bleeding. LDH coming down.    LVAD INTERROGATION:  HeartMate 3 LVAD:   Flow 3.3 liters/min, speed 5500, power 4.4, PI 7.7.    Objective:    Vital Signs:   Temp:  [98 F (36.7 C)-99.9 F (37.7 C)] 99.3 F (37.4 C) (12/25 0735) Pulse Rate:  [93-111] 93 (12/25 0735) Resp:  [13-25] 23 (12/25 0735) BP: (90-144)/(71-113) 139/85 (12/25 0735) SpO2:  [96 %-100 %] 100 % (12/25 0735) Weight:  [79.2 kg-80 kg] 79.2 kg (12/25 0222) Last BM Date: 08/28/18 Mean arterial Pressure 110 this am   Intake/Output:   Intake/Output Summary (Last 24 hours) at 08/29/2018 0834 Last data filed at 08/29/2018 0400 Gross per 24 hour  Intake 476.4 ml  Output 200 ml  Net 276.4 ml     Physical Exam    General:  Lying in bed. No resp difficulty HEENT: normal Neck: supple. JVP 7-8 . Carotids 2+ bilat; no bruits. No lymphadenopathy or thyromegaly appreciated. Cor: Mechanical heart sounds with LVAD hum present. L chest perm cath Lungs: clear Abdomen: soft, nontender, nondistended. No hepatosplenomegaly. No bruits or masses. Good bowel sounds. Driveline: C/D/I; securement device intact and driveline incorporated Extremities: no cyanosis, clubbing, rash, edema LUE hematoma at AVF site  Neuro: alert & orientedx3, cranial nerves grossly intact. moves all 4 extremities w/o difficulty. Affect pleasant   Telemetry   NSR 90s Personally reviewed   Labs   Basic Metabolic Panel: Recent Labs  Lab 08/25/18 0621 08/26/18 0301 08/27/18 0711 08/28/18 1839 08/29/18 0600  NA 136 137 132* 135 134*  K 3.6 3.9 4.2 3.4* 3.9  CL 96* 100 98 96* 97*  CO2 25 22 25 24  20*  GLUCOSE 107* 79 244* 162* 252*  BUN 17 24* 18 15 24*  CREATININE 6.23* 8.39* 6.07* 5.11*  6.12*  CALCIUM 8.4* 8.3* 7.8* 9.5 8.5*    Liver Function Tests: Recent Labs  Lab 08/22/18 1122 08/28/18 1839  AST 45* 35  ALT 19 21  ALKPHOS 116 120  BILITOT 1.1 1.2  PROT 7.6 8.1  ALBUMIN 3.1* 3.6   Recent Labs  Lab 08/28/18 1839  LIPASE 30   No results for input(s): AMMONIA in the last 168 hours.  CBC: Recent Labs  Lab 08/22/18 1122  08/25/18 0621 08/26/18 0301 08/27/18 0711 08/28/18 1839 08/29/18 0600  WBC 15.9*   < > 12.6* 15.3* 14.0* 26.4* 24.8*  NEUTROABS 13.8*  --   --   --   --  23.7*  --   HGB 9.7*   < > 9.3* 8.9* 8.5* 11.2* 10.0*  HCT 31.1*   < > 29.1* 28.2* 27.9* 35.5* 31.8*  MCV 89.9   < > 92.4 93.1 94.9 92.4 94.1  PLT 315   < > 374 359 310 333 332   < > = values in this interval not displayed.    INR: Recent Labs  Lab 08/25/18 0621 08/26/18 0301 08/27/18 0711 08/28/18 1839 08/29/18 0600  INR 3.00 3.36 2.49 1.32 1.37    Other results:  EKG:    Imaging   Ct Abdomen Pelvis Wo Contrast  Result Date: 08/28/2018 CLINICAL DATA:  History of Crohn's in dialysis recurrent nausea and vomiting. EXAM: CT ABDOMEN AND PELVIS WITHOUT CONTRAST  TECHNIQUE: Multidetector CT imaging of the abdomen and pelvis was performed following the standard protocol without IV contrast. COMPARISON:  03/13/2018 FINDINGS: Lower chest: Partially included left ventricular assist device is identified with stable cardiomegaly. Subpleural atelectasis and/or scarring is seen at each lung base. No effusion or pneumothorax. Hepatobiliary: No focal liver abnormality is seen. No gallstones, gallbladder wall thickening, or biliary dilatation. Pancreas: Unremarkable. No pancreatic ductal dilatation or surrounding inflammatory changes. Spleen: Normal in size without focal abnormality. Adrenals/Urinary Tract: Normal bilateral adrenal glands. No nephrolithiasis nor obstructive uropathy. Urinary bladder is partially decompressed in appearance but unremarkable. Stomach/Bowel: Mild liquid stool  within the colon suspicious for diarrheal disease. Right colectomy with chain sutures noted in the right hemiabdomen. No bowel obstruction. Moderate distention the stomach with ingested food. Normal small bowel rotation is noted. Vascular/Lymphatic: Aortoiliac atherosclerosis. No lymphadenopathy. Reproductive: Size prostate. Other: Free air nor free fluid. Periumbilical fat containing hernia. Musculoskeletal: Midline ventral scarring. A percutaneous catheter is noted from right lower quadrant approach with tip extending to the left upper quadrant. This is partially obscured by streak artifacts from the patient's left ventricular assist device. IMPRESSION: 1. Mild liquid stool within the colon suspicious for diarrheal disease. Right hemicolectomy. No bowel obstruction or inflammation. 2. Aortoiliac atherosclerosis. Electronically Signed   By: Ashley Royalty M.D.   On: 08/28/2018 20:26      Medications:     Scheduled Medications: . amiodarone  200 mg Oral Daily  . aspirin EC  81 mg Oral Daily  . atorvastatin  40 mg Oral q1800  . calcitRIOL  0.5 mcg Oral Q M,W,F  . ciprofloxacin  500 mg Oral Q24H  . citalopram  20 mg Oral Daily  . dronabinol  2.5 mg Oral BID AC  . erythromycin  250 mg Oral TID WC  . insulin aspart  5 Units Subcutaneous TID WC  . insulin glargine  10 Units Subcutaneous Daily  . [START ON 08/30/2018] isosorbide-hydrALAZINE  1 tablet Oral 3 times per day on Sun Tue Thu Sat  . metoCLOPramide  10 mg Oral TID WC  . multivitamin  1 tablet Oral QHS  . pantoprazole  40 mg Oral BID  . sevelamer carbonate  4,000 mg Oral TID WC  . Warfarin - Pharmacist Dosing Inpatient   Does not apply q1800     Infusions: . heparin 1,550 Units/hr (08/29/18 0400)     PRN Medications:  acetaminophen, docusate sodium, lidocaine-prilocaine, LORazepam, LORazepam, ondansetron (ZOFRAN) IV, promethazine, sevelamer carbonate, traMADol   Patient Profile   [name] is a 52 y.o. malewith  history of CAD s/p anterior MI in 2010 and NSTEMI in 3/18 with DES to pRCA and mLAD, diabetic, gastroparesis, Chrons disease, DMIIand ischemic cardiomyopathy, 10/2018s/p Heartmate 3 LVAD(emergent exchange 02/2018 due to outflow graft kink)and ESRD(HD MWF). He has LUE AVR 05/2018.  Admitted 12/24 with recurrent N/V/D and subtherapeutic INR and elevated LDH   Assessment/Plan:    1. Recurrent nauseas/vomitting due to diabetic gastroparesis: Difficult to manage. Now with very frequent readmits. Also had component of diarrhea today but doubt c. Diff. Ab CT essentially unremarkable in ER. He has seen gastroparesis specialist at [facility]. Surprisingly, gastric emptying study was normal. However, electrogastrogram showed poor gastric accomodation/capacity. Therefore, small frequent meals recommended. Also morning nausea thought to be GERD and Protonix was increased.  - Had watery diarrhea with WBC 26.4k. Stool does not seem like c.diff.  - Still with some mild diarrhea this am. Now on Cipro for possible bacterial gastroenteritis. Stool cx  pending (d/w RN). Continue cipro. WBC-> 26k -> 24k. - AB CT ok on 12/24 - Continue reglan 5 mg tid/ac, chronic erythromycin, and Marinol.  - Continue Protonix BID. - Continue to use phenergan prn. Works much better than Zofran.  - Make effort to eat small, more frequent meals.   2.. Elevated LDH and Subtherapeutic INR - INR 1.37 On heparin. Pharmacy dosing coumadin. Discussed dosing with PharmD personally. - LDH 427 -> 373. Doubt pump clot. Not dark urine or power spikes. Continue heparin  3. Chronic systolic CHF s/p HM-3 VAD: Ischemic cardiomyopathy. St Jude ICD. NSTEMI in March 2018 with DES to LAD and RCA, complicated by cardiogenic shock, low output requiring milrinone. Echo 8/18 with EF 20-25%, moderate MR. CPX (8/18) with severe functional impairment due to HF. He is s/p HM3 LVAD placement. He had pump thrombosis 6/19 due to [facility] outflow graft  kinking. He had pump exchange for another HM3.RHC showed low filling pressures and good cardiac output 08/09/18   -Recently dry weight increased. This seems to be helping (no low flows with HD this time in the hospital).  - Volume status ok - Continue Bidil 1 tabTIDon non-HD days.  - VAD interrogated personally. Parameters stable. [facility] management as above - Continue ASA.  - He is now listed for heart/kidney at [facility]. [place] 08/09/18 with optimized filling pressures and cardiac output. Normal PA pressure.   4. ESRD: Developed peri-operatively after HM3 placement. He is tolerating HD, currently via catheter. Hematoma post-AV LUE graft placement. Graft now nonfunctional   -Coumadin was restarted 12/12. INR 1.7. Goal 2.0 - 2.5. Will start heparin.  - MWF HD but had HD on Tuesday Will alert Renal  5. LUE hematoma - resolving  6. CAD: NSTEMI in 3/18. LHC with 99% ulcerated lesion proximal RCA with left to right collaterals, 95% mid LAD stenosis after mid LAD stent. s/p PCI to RCA and LAD on 11/25/16.  - No s/s of ischemia.    - Continue statin.  7. DM: HgbA1c well-controlled No change. SSI while in house.   8. HTN:  - BP up this am - Continue Bidil on non HD days.  - Use prn hydral for MAP > 100  9. Anemia: Received 1PURBc 12/9.  - Hgb 10.0 today. Follow while on heparin .  I reviewed the LVAD parameters from today, and compared the results to the patient's prior recorded data.  No programming changes were made.  The LVAD is functioning within specified parameters.  The patient performs LVAD self-test daily.  LVAD interrogation was negative for any significant power changes, alarms or PI events/speed drops.  LVAD equipment check completed and is in good working order.  Back-up equipment present.   LVAD education done on emergency procedures and precautions and reviewed exit site care.  Length of Stay: 1  [name], MD 08/29/2018, 8:34 AM  VAD Team --- VAD ISSUES  ONLY--- Pager [phone] (7am - 7am)  Advanced Heart Failure Team  Pager [phone] (M-F; 7a - 4p)  Please contact [facility] for night-coverage after hours (4p -7a ) and weekends on [email or web address]

## 2018-08-29 NOTE — Progress Notes (Addendum)
[name] for heparin/warfarin Indication: LVAD/Factor 5 Leiden  Allergies  Allergen Reactions  . Metformin And Related Diarrhea    Patient Measurements: Height: 5' 7"  (170.2 cm) Weight: 174 lb 9.7 oz (79.2 kg) IBW/kg (Calculated) : 66.1  Vital Signs: Temp: 99.1 F (37.3 C) (12/25 1142) Temp Source: Oral (12/25 1142) BP: 154/89 (12/25 1142) Pulse Rate: 99 (12/25 1142)  Labs: Recent Labs    08/27/18 0711 08/28/18 1839 08/29/18 0600  HGB 8.5* 11.2* 10.0*  HCT 27.9* 35.5* 31.8*  PLT 310 333 332  LABPROT 26.5* 16.2* 16.7*  INR 2.49 1.32 1.37  CREATININE 6.07* 5.11* 6.12*    Estimated Creatinine Clearance: 13.1 mL/min (A) (by C-G formula based on SCr of 6.12 mg/dL (H)).   Medical History: Past Medical History:  Diagnosis Date  . Angina   . ASCVD (arteriosclerotic cardiovascular disease)    , Anterior infarction 2005, LAD diagonal bifurcation intervention 03/2004  . Automatic implantable cardiac defibrillator -St. Jude's       . Benign neoplasm of colon   . CHF (congestive heart failure) ([place])   . Chronic systolic heart failure ([place])   . Coronary artery disease     Widely patent previously placed stents in the left anterior   . Crohn's disease ([place])   . 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 Dialysis patient ([place])   . Dyspnea   . Gastroparesis   . GERD (gastroesophageal reflux disease)   . High cholesterol   . Hyperlipidemia   . Hypersomnolent    Previous diagnosis of narcolepsy  . Hypertension, essential   . Ischemic cardiomyopathy    Ejection fraction 15-20% catheterization 2010  . Type II or unspecified type diabetes mellitus without mention of complication, not stated as uncontrolled   . 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 Of note INR was supratherapeutic on 12/22. INR on admission subtherapeutic at 1.32, last dose 12/23.  Recently admitted with a therapeutic heparin rate of 1550 units/hr which we will start.      Unable to collect heparin level today despite multiple attempts. Central line was placed this afternoon- heparin was stopped and plan to restart 4 hours after placement. INR is 1.37 after receiving warfarin 5 mg last night. On concurrent ciprofloxacin, which can impact warfarin sensitivity.   PTA warfarin dosing = 74m daily except 7.533mon Mondays  Goal of Therapy:  INR 2-3 Heparin level 0.3-0.7 units/ml Monitor platelets by anticoagulation protocol: Yes   Plan:  Warfarin 5 mg PO x 1 tonight Restart heparin drip at 1550 units/hr on 12/25 at 1730 F/u 8 hour heparin level after restart Daily INR, heparin level, CBC, s/s bleeding  KiAntonietta JewelPharmD, BCCCP Clinical Pharmacist  Pager: [phone]: [phone] check AMION for all MCStartupumbers 08/29/2018 2:18 PM

## 2018-08-30 ENCOUNTER — Inpatient Hospital Stay (HOSPITAL_BASED_OUTPATIENT_CLINIC_OR_DEPARTMENT_OTHER): Payer: BLUE CROSS/BLUE SHIELD

## 2018-08-30 DIAGNOSIS — N186 End stage renal disease: Secondary | ICD-10-CM

## 2018-08-30 LAB — BASIC METABOLIC PANEL
Anion gap: 13 (ref 5–15)
BUN: 33 mg/dL — ABNORMAL HIGH (ref 6–20)
CO2: 25 mmol/L (ref 22–32)
CREATININE: 8.38 mg/dL — AB (ref 0.61–1.24)
Calcium: 8.1 mg/dL — ABNORMAL LOW (ref 8.9–10.3)
Chloride: 98 mmol/L (ref 98–111)
GFR calc Af Amer: 8 mL/min — ABNORMAL LOW (ref >60)
GFR calc non Af Amer: 7 mL/min — ABNORMAL LOW (ref >60)
Glucose, Bld: 95 mg/dL (ref 70–99)
Potassium: 3.7 mmol/L (ref 3.5–5.1)
Sodium: 136 mmol/L (ref 135–145)

## 2018-08-30 LAB — HEPARIN LEVEL (UNFRACTIONATED): Heparin Unfractionated: 1.3 IU/mL — ABNORMAL HIGH (ref 0.30–0.70)

## 2018-08-30 LAB — CBC
HEMATOCRIT: 28.7 % — AB (ref 39.0–52.0)
Hemoglobin: 9.2 g/dL — ABNORMAL LOW (ref 13.0–17.0)
MCH: 30.2 pg (ref 26.0–34.0)
MCHC: 32.1 g/dL (ref 30.0–36.0)
MCV: 94.1 fL (ref 80.0–100.0)
Platelets: 289 10*3/uL (ref 150–400)
RBC: 3.05 MIL/uL — ABNORMAL LOW (ref 4.22–5.81)
RDW: 17.3 % — ABNORMAL HIGH (ref 11.5–15.5)
WBC: 14 10*3/uL — AB (ref 4.0–10.5)
nRBC: 0 % (ref 0.0–0.2)

## 2018-08-30 LAB — GLUCOSE, CAPILLARY
Glucose-Capillary: 161 mg/dL — ABNORMAL HIGH (ref 70–99)
Glucose-Capillary: 75 mg/dL (ref 70–99)
Glucose-Capillary: 102 mg/dL — ABNORMAL HIGH (ref 70–99)
Glucose-Capillary: 68 mg/dL — ABNORMAL LOW (ref 70–99)

## 2018-08-30 LAB — PROTIME-INR
INR: 1.93
Prothrombin Time: 21.8 seconds — ABNORMAL HIGH (ref 11.4–15.2)

## 2018-08-30 LAB — LACTATE DEHYDROGENASE: LDH: 285 U/L — AB (ref 98–192)

## 2018-08-30 MED ORDER — ISOSORB DINITRATE-HYDRALAZINE 20-37.5 MG PO TABS
1.0000 | ORAL_TABLET | Freq: Two times a day (BID) | ORAL | Status: AC
  Administered 2018-08-30 (×2): 1 via ORAL
  Filled 2018-08-30 (×2): qty 1

## 2018-08-30 MED ORDER — WARFARIN SODIUM 2.5 MG PO TABS
2.5000 mg | ORAL_TABLET | Freq: Once | ORAL | Status: AC
  Administered 2018-08-30: 2.5 mg via ORAL
  Filled 2018-08-30: qty 1

## 2018-08-30 MED ORDER — CHLORHEXIDINE GLUCONATE CLOTH 2 % EX PADS
6.0000 | MEDICATED_PAD | Freq: Every day | CUTANEOUS | Status: DC
  Administered 2018-08-30 – 2018-09-01 (×2): 6 via TOPICAL

## 2018-08-30 MED ORDER — CALCITRIOL 0.25 MCG PO CAPS
1.0000 ug | ORAL_CAPSULE | ORAL | Status: DC
  Administered 2018-08-31 – 2018-09-03 (×2): 1 ug via ORAL
  Filled 2018-08-30 (×3): qty 4

## 2018-08-30 NOTE — Progress Notes (Addendum)
[facility] Progress Note   Background: 52 Y/O AA male with ESRD on hemodialysis MWF at [facility]. PMH significant for CAD, DM, gastroparesis, ischemic cardiomyopathy S/P Heartmate 3 LVAD 06/2017. Admitted 08/28/2018 with recurrent nausea, vomiting and diarrhea. No further diarrhea since adm, unable to obtain sample for C. Diff.     Subjective: No C/Os. Denies Abd pain, N & V. Low grade temp 99.3 over night.   Objective Vitals:   08/29/18 2336 08/30/18 0337 08/30/18 0647 08/30/18 0743  BP: 104/68 95/74  116/86  Pulse: 83 83  85  Resp: 17 17  18   Temp: 98.4 F (36.9 C) 98.2 F (36.8 C)  98.7 F (37.1 C)  TempSrc: Oral Oral  Oral  SpO2: 100% 99%  100%  Weight:   79.7 kg   Height:       Physical Exam General: Pleasant, NAD Heart: LVAD hum,  Mild JVD @ 30 degrees.  Lungs: CTAB A/P Abdomen: Active BS, NT, ND Liver down 6 cm Extremities: No LE edema Dialysis Access: LUA AVG bruised, no bruit. LIJ TDC drsg CDI. RIJ TL CVL drsg intact.    Additional Objective Labs: Basic Metabolic Panel: Recent Labs  Lab 08/28/18 1839 08/29/18 0600 08/30/18 0508  NA 135 134* 136  K 3.4* 3.9 3.7  CL 96* 97* 98  CO2 24 20* 25  GLUCOSE 162* 252* 95  BUN 15 24* 33*  CREATININE 5.11* 6.12* 8.38*  CALCIUM 9.5 8.5* 8.1*   Liver Function Tests: Recent Labs  Lab 08/28/18 1839  AST 35  ALT 21  ALKPHOS 120  BILITOT 1.2  PROT 8.1  ALBUMIN 3.6   Recent Labs  Lab 08/28/18 1839  LIPASE 30   CBC: Recent Labs  Lab 08/26/18 0301 08/27/18 0711 08/28/18 1839 08/29/18 0600 08/30/18 0508  WBC 15.3* 14.0* 26.4* 24.8* 14.0*  NEUTROABS  --   --  23.7*  --   --   HGB 8.9* 8.5* 11.2* 10.0* 9.2*  HCT 28.2* 27.9* 35.5* 31.8* 28.7*  MCV 93.1 94.9 92.4 94.1 94.1  PLT 359 310 333 332 289   Blood Culture    Component Value Date/Time   SDES BLOOD RIGHT HAND 08/28/2018 2130   SPECREQUEST  08/28/2018 2130    BOTTLES DRAWN AEROBIC ONLY Blood Culture results may  not be optimal due to an excessive volume of blood received in culture bottles   CULT  08/28/2018 2130    NO GROWTH < 24 HOURS Performed at [facility] [address] [place], [place] [address]    REPTSTATUS PENDING 08/28/2018 2130    Cardiac Enzymes: No results for input(s): CKTOTAL, CKMB, CKMBINDEX, TROPONINI in the last 168 hours. CBG: Recent Labs  Lab 08/28/18 2159 08/29/18 1144 08/29/18 1530 08/29/18 2134 08/30/18 0744  GLUCAP 260* 244* 159* 122* 161*   Iron Studies: No results for input(s): IRON, TIBC, TRANSFERRIN, FERRITIN in the last 72 hours. @lablastinr3 @ Studies/Results: Ct Abdomen Pelvis Wo Contrast  Result Date: 08/28/2018 CLINICAL DATA:  History of Crohn's in dialysis recurrent nausea and vomiting. EXAM: CT ABDOMEN AND PELVIS WITHOUT CONTRAST TECHNIQUE: Multidetector CT imaging of the abdomen and pelvis was performed following the standard protocol without IV contrast. COMPARISON:  03/13/2018 FINDINGS: Lower chest: Partially included left ventricular assist device is identified with stable cardiomegaly. Subpleural atelectasis and/or scarring is seen at each lung base. No effusion or pneumothorax. Hepatobiliary: No focal liver abnormality is seen. No gallstones, gallbladder wall thickening, or biliary dilatation. Pancreas: Unremarkable. No pancreatic ductal  dilatation or surrounding inflammatory changes. Spleen: Normal in size without focal abnormality. Adrenals/Urinary Tract: Normal bilateral adrenal glands. No nephrolithiasis nor obstructive uropathy. Urinary bladder is partially decompressed in appearance but unremarkable. Stomach/Bowel: Mild liquid stool within the colon suspicious for diarrheal disease. Right colectomy with chain sutures noted in the right hemiabdomen. No bowel obstruction. Moderate distention the stomach with ingested food. Normal small bowel rotation is noted. Vascular/Lymphatic: Aortoiliac atherosclerosis. No lymphadenopathy. Reproductive: Size  prostate. Other: Free air nor free fluid. Periumbilical fat containing hernia. Musculoskeletal: Midline ventral scarring. A percutaneous catheter is noted from right lower quadrant approach with tip extending to the left upper quadrant. This is partially obscured by streak artifacts from the patient's left ventricular assist device. IMPRESSION: 1. Mild liquid stool within the colon suspicious for diarrheal disease. Right hemicolectomy. No bowel obstruction or inflammation. 2. Aortoiliac atherosclerosis. Electronically Signed   By: Ashley Royalty M.D.   On: 08/28/2018 20:26   Dg Chest Port 1 View  Result Date: 08/29/2018 CLINICAL DATA:  Ischemic cardiomyopathy. Congestive heart failure. Central line placement. EXAM: PORTABLE CHEST 1 VIEW COMPARISON:  08/22/2018 FINDINGS: The heart size and mediastinal contours are within normal limits. Left ventricular assist device and AICD are again seen. Left jugular dual-lumen central venous dialysis catheter is in stable position. A new right jugular central venous catheter is seen with tip overlying the proximal right atrium, approximately 1.8 cm below the superior cavoatrial junction. No evidence of pneumothorax. Low lung volumes are again noted, however both lungs remain clear. Previous median sternotomy. IMPRESSION: 1. New right jugular central venous catheter tip overlies the proximal right atrium, approximately 1.8 cm below the superior cavoatrial junction. No evidence of pneumothorax. 2. Stable low lung volumes. No active lung disease. Electronically Signed   By: Earle Gell M.D.   On: 08/29/2018 13:55   Medications:  . amiodarone  200 mg Oral Daily  . aspirin EC  81 mg Oral Daily  . atorvastatin  40 mg Oral q1800  . calcitRIOL  0.5 mcg Oral Q M,W,F  . ciprofloxacin  500 mg Oral Q24H  . citalopram  20 mg Oral Daily  . dronabinol  2.5 mg Oral BID AC  . erythromycin  250 mg Oral TID WC  . insulin aspart  0-15 Units Subcutaneous TID WC  . insulin aspart  0-5  Units Subcutaneous QHS  . insulin aspart  5 Units Subcutaneous TID WC  . insulin glargine  10 Units Subcutaneous Daily  . isosorbide-hydrALAZINE  1 tablet Oral 3 times per day on Sun Tue Thu Sat  . metoCLOPramide  10 mg Oral TID WC  . multivitamin  1 tablet Oral QHS  . pantoprazole  40 mg Oral BID  . sevelamer carbonate  4,000 mg Oral TID WC  . Warfarin - Pharmacist Dosing Inpatient   Does not apply q1800   HD orders:[facility] MWF 4 hrs 15 min 180NRe 400/800 81 kg 2.0 K/ 3.0 Ca  TDC/Maturing LUA AVG -No Heparin  -Mircera 150 mcg IV q 2 weeks (last dose 08/20/18 Last HGB 9.4 08/22/18) -Calcitriol 1.0 mcg PO TIW (last PTH 328 Ca 8.9 C Ca 9.3 Phos 2.9 08/22/18  Assessment/Plan: 1. Recurrent N & V-DM gastroparesis. WBC 14.0  BC NGTD,  On Cipro.Recurrent issues 2. ESRD -MWF HD tomorrow morning prior to HD lower Bidil with low bps,  3. Anemia - HGB 9.2 slow decline since adm. Give Aranesp 100 mcg IV with HD tomorrow.  4. Secondary hyperparathyroidism - Ca 8.1 C Ca 8.6. Continue  binders, VDRA.  5. HTN/volume - No evidence of volume excess by exam, BP on soft side. Continues on Bidil. Wt 79.7 kg. Has lost wt. Will need lower EDW on DC. 1.5-2 liters in HD tomorrow as tolerated.  6. Nutrition - Albumin 3.6 renal/carb mod diet.  7. DM-per primary 8. Chronic systolic HF on LVAD-per primary 9. Failure of LUA AVG-no bruit/thrill. Ask VVS to see while here.  10.CAD-per primary  [name]. Brown NP-C 08/30/2018, 9:04 AM  [employer] [phone]  I have seen and examined this patient and agree with the plan of care seen, eval, examined, counseled.  Discussed with primary and NP .  Jeneen Rinks Tiler Brandis 08/30/2018, 10:17 AM

## 2018-08-30 NOTE — Progress Notes (Signed)
[place] for heparin/warfarin Indication: LVAD/Factor 5 Leiden  Allergies  Allergen Reactions  . Metformin And Related Diarrhea    Patient Measurements: Height: 5' 7"  (170.2 cm) Weight: 175 lb 11.3 oz (79.7 kg) IBW/kg (Calculated) : 66.1  Vital Signs: Temp: 98.7 F (37.1 C) (12/26 0743) Temp Source: Oral (12/26 0743) BP: 116/86 (12/26 0743) Pulse Rate: 85 (12/26 0743)  Labs: Recent Labs    08/28/18 1839 08/29/18 0600 08/30/18 0508  HGB 11.2* 10.0* 9.2*  HCT 35.5* 31.8* 28.7*  PLT 333 332 289  LABPROT 16.2* 16.7* 21.8*  INR 1.32 1.37 1.93  HEPARINUNFRC  --   --  1.30*  CREATININE 5.11* 6.12* 8.38*    Estimated Creatinine Clearance: 10.3 mL/min (A) (by C-G formula based on SCr of 8.38 mg/dL (H)).   Medical History: Past Medical History:  Diagnosis Date  . Angina   . ASCVD (arteriosclerotic cardiovascular disease)    , Anterior infarction 2005, LAD diagonal bifurcation intervention 03/2004  . Automatic implantable cardiac defibrillator -St. Jude's       . Benign neoplasm of colon   . CHF (congestive heart failure) ([place])   . Chronic systolic heart failure ([place])   . Coronary artery disease     Widely patent previously placed stents in the left anterior   . Crohn's disease ([place])   . 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 Dialysis patient ([place])   . Dyspnea   . Gastroparesis   . GERD (gastroesophageal reflux disease)   . High cholesterol   . Hyperlipidemia   . Hypersomnolent    Previous diagnosis of narcolepsy  . Hypertension, essential   . Ischemic cardiomyopathy    Ejection fraction 15-20% catheterization 2010  . Type II or unspecified type diabetes mellitus without mention of complication, not stated as uncontrolled   . Unspecified gastritis and gastroduodenitis without mention of hemorrhage     Medications:  Scheduled:   Assessment: 31 yoM with LVAD on warfarin PTA, now suspected LVAD  thrombus and pharmacy consulted to start heparin and continue warfarin. INR supratherapeutic on 12/22 and then on readmission subtherapeutic at 1.32. Pt started on IV heparin while INR low, now up to 1.93 so heparin infusion stopped. CBC stable, LDH trending down. Pt noted to be on ciprofloxacin for 5 days which may increase warfarin effect.   PTA warfarin dosing = 36m daily except 7.567mon Mondays  Goal of Therapy:  INR 2-3 Heparin level 0.3-0.7 units/ml Monitor platelets by anticoagulation protocol: Yes   Plan:  -Stop heparin -Warfarin 2.35m435mO x1 tonight -Daily INR, LDH, CBC  MicArrie SenateharmD, BCPS Clinical Pharmacist [phone] check AMION for all [facility] 08/30/2018

## 2018-08-30 NOTE — Discharge Summary (Addendum)
Advanced Heart Failure Team  Discharge Summary   Patient ID: [name] MRN: [id number], DOB/AGE: 53/03/1966 52 y.o. Admit date: 08/28/2018 D/C date:  09/03/2018    Primary Discharge Diagnoses:  1. Recurrent nauseas/vomitting due todiabetic gastroparesis 2.. Elevated LDH and Subtherapeutic INR 3.Chronic systolic CHF s/p HM-3 VAD 4. ESRD 5. LUE hematoma 6. CAD 7. DM 8. HTN 9. Anemia  Hospital Course:  [name] is a 52 y.o. malewith history of CAD s/p anterior MI in 2010 and NSTEMI in 3/18 with DES to pRCA and mLAD, diabetic, gastroparesis, Chrons disease, DMIIand ischemic cardiomyopathy, 10/2018s/p Heartmate 3 LVAD(emergent exchange 02/2018 due to outflow graft kink)and ESRD(HD MWF). He has LUE AVR 05/2018.  Recently admitted 08/22/18 with N/V from HD. Treated in ED with Ativan and Phenergan. LDH elevated on arrival with subtherapeutic INR. It was thought that LDH elevation was most likely from his recent LUE graft and subsequent hematoma. Covered with heparin while awaiting therapeutic INR. VVS following and recommended following over the weekend and if hand pain not improved to attempt graft ligation. VVS and OT saw again and recommended OT as outpatient and continuing to follow left hand strength. Hospital course complicated by altered mental status in setting of multiple doses of phenergan and Ativan. Head CT 08/22/18 showed no changes.   LDH trended down on heparin and with adjustment of his INR. Not thought to be due to pump thrombosis. Discharged on 08/27/18.  Admitted 12/24 with recurrent N/V/D and subtherapeutic INR and elevated LDH. WBC noted to be > 26k. Started on Cipro for possible bacterial gastroenteritis with improvement in symptoms. LDH trended down on heparin with subtherapeutic INR, thought unlikely to be pump thrombosis.   Pt underwent HD am of 08/31/18 and tolerated poorly with Nausea. INR was low on admit so he was on heparin which was later  stopped after INR was therapeutic.   He was examined 09/03/2018 and determined stable for discharge home with close follow up as below. See problem list for details.   1. Recurrent nauseas/vomitting due todiabetic gastroparesis: Difficult to manage.Now with very frequent readmits. Also had component of diarrhea today but doubt c. Diff. Ab CT essentially unremarkable in ER.He has seen gastroparesis specialist at [facility]. Surprisingly, gastric emptying study was normal. However, electrogastrogram showed poor gastric accomodation/capacity. Therefore, small frequent meals recommended. Also morning nausea thought to be GERD and Protonix was increased.  - Had watery diarrhea with WBC 26.4k. Stool does not seem like c.diff.  - WBC 26.4 > 24.8 > 14 > 10.9-> 14.1 -> 12.7->13. Completed 5 days of Cipro for possible bacterial gastroenteritis. Bcx negative - Stool Cx doesn't appear to have been sent.   - AB CT ok on 12/24 - Continue reglan 5 mg tid/ac, chronic erythromycin, and Marinol.  - Continue Protonix BID. -Continue to use phenergan prn. Works much better than Zofran.  - Make effort to eat small, more frequent meals. - Not sure there is a good answer to his problem and may be heading toward more palliative approach. - Encouraged him to be more active and walk to promote GI motility.  2.. Elevated LDH and Subtherapeutic INR -Initially on heparin drip with low INR.  - INR 2.24.  Discussed dosing with PharmD personally. - LDHimproved: 427 -> 373 -> 284 -> 259 -> 270 D-> 264->237  -INR goal 2-3.   3.Chronic systolic CHF s/p HM-3 DHR:CBULAGTX cardiomyopathy. St Jude ICD. NSTEMI in March 2018 with DES to LAD and RCA, complicated by cardiogenic shock,  low output requiring milrinone. Echo 8/18 with EF 20-25%, moderate MR. CPX (8/18) with severe functional impairment due to HF. He is s/p HM3 LVAD placement. He had pump thrombosis 6/19 due to [facility] outflow graft kinking. He had pump  exchange for another HM3.RHC showed low filling pressures and good cardiac output 08/09/18  -Recently dry weight increased. This has helped his flows, but not his overall debility.  -Volume managed per HD. - Continue Bidil 1 tabTIDon non-HD days. Maps 70-80s  - VAD interrogated personally. Parameters stable. [facility] management as above INR 2.30 - Continue ASA.  - He is now listed for heart/kidney at [facility] but suspect severity of gastroparesis is prohibitive for OHTx. [place] 08/09/18 with optimized filling pressures and cardiac output. Normal PA pressure.  4. ESRD: Developed peri-operatively after HM3 placement. He is tolerating HD, currently via catheter. Hematoma post-AV LUE graft placement.Graft now nonfunctional - Dialysis units on Holiday schedule.  - HD today and outpatient tomorrow.    5. LUE hematoma - Resolving. Assessed by OT and VVS multiple times this admit. Hematoma continued to improve. Expect this will taking weeks to resolve.   6. BMW:UXLKGM in 3/18. LHC with 99% ulcerated lesion proximal RCA with left to right collaterals, 95% mid LAD stenosis after mid LAD stent. s/p PCI to RCA and LAD on 11/25/16.  - No s/s of ischemia.    - Continue statin.  7. DM:  - HgbA1c well-controlled. No change.   - He was covered with SSI while in house.  - CBGs ok   8. HTN:  -BP followed closely and remains stable.  - Continue Bidil on non HD days.   9. Anemia: Received 1PURBc 08/13/2018 .  - Hgb11 -> 10 -> 9.2 -> 8.2 -> 9.0. -> 8.9-> 7.7.  - No overt signs of bleeding. He was given 1UPRBCs on 09/03/2018. Plan to check CBC next office visit.     LVAD Interrogation HM III Speed: 5500 Flow: 4.1  PI: 3.5  Power: 4.  Rare PI events   Discharge Weight: 180 lbs Discharge Vitals: Blood pressure 104/90, pulse 78, temperature 98.8 F (37.1 C), temperature source Oral, resp. rate 20, height 5' 7"  (1.702 m), weight 81.8 kg, SpO2 100 %.  Labs: Lab Results  Component Value Date    WBC 13.1 (H) 09/03/2018   HGB 7.7 (L) 09/03/2018   HCT 24.6 (L) 09/03/2018   MCV 91.8 09/03/2018   PLT 264 09/03/2018    Recent Labs  Lab 08/28/18 1839  09/03/18 0500  NA 135   < > 131*  K 3.4*   < > 4.1  CL 96*   < > 98  CO2 24   < > 22  BUN 15   < > 60*  CREATININE 5.11*   < > 11.36*  CALCIUM 9.5   < > 7.8*  PROT 8.1  --   --   BILITOT 1.2  --   --   ALKPHOS 120  --   --   ALT 21  --   --   AST 35  --   --   GLUCOSE 162*   < > 171*   < > = values in this interval not displayed.   Lab Results  Component Value Date   CHOL 128 05/04/2017   HDL 50 05/04/2017   LDLCALC 54 05/04/2017   TRIG 118 05/04/2017   BNP (last 3 results) Recent Labs    03/02/18 0400 03/08/18 0008 03/15/18 0106  BNP 694.1* 1,576.6*  811.3*    ProBNP (last 3 results) No results for input(s): PROBNP in the last 8760 hours.   Diagnostic Studies/Procedures   No results found.  Discharge Medications   Allergies as of 09/03/2018      Reactions   Metformin And Related Diarrhea      Medication List    TAKE these medications   amiodarone 200 MG tablet Commonly known as:  PACERONE Take 200 mg by mouth daily.   aspirin EC 81 MG tablet Take 1 tablet (81 mg total) by mouth daily.   atorvastatin 40 MG tablet Commonly known as:  LIPITOR Take 1 tablet (40 mg total) by mouth daily at 6 PM.   calcitRIOL 0.5 MCG capsule Commonly known as:  ROCALTROL Take 1 capsule (0.5 mcg total) by mouth every Monday, Wednesday, and Friday.   citalopram 20 MG tablet Commonly known as:  CELEXA Take 1 tablet (20 mg total) by mouth daily.   docusate sodium 100 MG capsule Commonly known as:  COLACE Take 100 mg by mouth daily as needed for mild constipation.   dronabinol 2.5 MG capsule Commonly known as:  MARINOL Take 1 capsule (2.5 mg total) by mouth 2 (two) times daily before lunch and supper.   erythromycin 250 MG tablet Commonly known as:  E-MYCIN Take 1 tablet (250 mg total) by mouth 3 (three)  times daily.   insulin aspart 100 UNIT/ML injection Commonly known as:  novoLOG Inject 5 Units into the skin 3 (three) times daily with meals.   insulin glargine 100 UNIT/ML injection Commonly known as:  LANTUS Inject 0.1 mLs (10 Units total) into the skin daily.   isosorbide-hydrALAZINE 20-37.5 MG tablet Commonly known as:  BIDIL Take 1.5 tablets by mouth every 8 (eight) hours. Take 1.5 tablets every 8 hours on non dialysis days. What changed:    how much to take  when to take this  additional instructions   lidocaine-prilocaine cream Commonly known as:  EMLA Apply 1 application topically as needed (to numb port).   LORazepam 0.5 MG tablet Commonly known as:  ATIVAN Take 1 tablet (0.5 mg total) by mouth every 12 (twelve) hours as needed (nausea).   metoCLOPramide 10 MG tablet Commonly known as:  REGLAN Take 1 tablet (10 mg total) by mouth 3 (three) times daily.   multivitamin Tabs tablet Take 1 tablet by mouth daily.   pantoprazole 40 MG tablet Commonly known as:  PROTONIX Take 40 mg by mouth 2 (two) times daily.   promethazine 12.5 MG tablet Commonly known as:  PHENERGAN Take 1 tablet (12.5 mg total) by mouth every 6 (six) hours as needed for nausea or vomiting.   sevelamer carbonate 800 MG tablet Commonly known as:  RENVELA Take 1,600-4,000 mg by mouth See admin instructions. 4020m three times daily with meals and 16063mtwice daily with snacks   traMADol 50 MG tablet Commonly known as:  ULTRAM Take 1 tablet (50 mg total) by mouth every 6 (six) hours as needed for moderate pain.   warfarin 5 MG tablet Commonly known as:  COUMADIN Take as directed. If you are unsure how to take this medication, talk to your nurse or doctor. Original instructions:  Take 5 mg (1 tablet) daily except 7.5 mg on Monday and Thursday. What changed:    how much to take  how to take this  when to take this  additional instructions       Disposition   The patient will  be discharged in stable condition  to home. Discharge Instructions    (HEART FAILURE PATIENTS) Call MD:  Anytime you have any of the following symptoms: 1) 3 pound weight gain in 24 hours or 5 pounds in 1 week 2) shortness of breath, with or without a dry hacking cough 3) swelling in the hands, feet or stomach 4) if you have to sleep on extra pillows at night in order to breathe.   Complete by:  As directed    Diet - low sodium heart healthy   Complete by:  As directed    Heart Failure patients record your daily weight using the same scale at the same time of day   Complete by:  As directed    INR  Goal: 2 - 3   Complete by:  As directed    Goal:  2 - 3   Increase activity slowly   Complete by:  As directed    Page VAD Coordinator at [phone]  Notify for: any VAD alarms, sustained elevations of power >10 watts, sustained drop in Pulse Index <3   Complete by:  As directed    Notify for:   any VAD alarms sustained elevations of power >10 watts sustained drop in Pulse Index <3     Speed Settings:   Complete by:  As directed    Fixed 5500 RPM Low 5200 RPM     Follow-up Information    [facility] Follow up on 09/13/2018.   Specialty:  Cardiology Why:  at 0900. Garage Code 1990 Contact information: [address] [id number] [place] [phone]            Duration of Discharge Encounter: Greater than 35 minutes   Signed, Georgiana Shore, NP  09/03/2018, 12:39 PM

## 2018-08-30 NOTE — Progress Notes (Addendum)
Advanced Heart Failure VAD Team Note  PCP-Cardiologist: No primary care provider on file.   Subjective:    Feeling better this am. No further diarrhea since yesterday am. Denies SOB or N/V.  INR 1.93 on heparin. Denies bleeding.   LDH 427 -> 373 -> 285.  LVAD Interrogation HM 3: Speed: 5500 Flow: 3.5 PI: 5.9 Power: 5.0. No PI events   Objective:    Vital Signs:   Temp:  [98.2 F (36.8 C)-99.3 F (37.4 C)] 98.2 F (36.8 C) (12/26 0337) Pulse Rate:  [83-99] 83 (12/26 0337) Resp:  [10-23] 17 (12/26 0337) BP: (95-154)/(68-93) 95/74 (12/26 0337) SpO2:  [98 %-100 %] 99 % (12/26 0337) Weight:  [79.7 kg] 79.7 kg (12/26 0647) Last BM Date: 08/28/18 Mean arterial Pressure 90-100s    Intake/Output:   Intake/Output Summary (Last 24 hours) at 08/30/2018 0728 Last data filed at 08/29/2018 2300 Gross per 24 hour  Intake 231.27 ml  Output -  Net 231.27 ml     Physical Exam    General: Lying in bed. NAD.  HEENT: Normal. Neck: Supple, JVP 7-8 cm. Carotids OK.  Cor:  Mechanical heart sounds with LVAD hum present. L chest perm cath Lungs:  CTAB, normal effort.  Abdomen:  NT, ND, no HSM. No bruits or masses. +BS  LVAD exit site: Well-healed and incorporated. Dressing dry and intact. No erythema or drainage. Stabilization device present and accurately applied. Driveline dressing changed daily per sterile technique. Extremities:  Warm and dry. No cyanosis, clubbing, rash, or edema. LUE hematoma at AVF site.  Neuro:  Alert & oriented x 3. Cranial nerves grossly intact. Moves all 4 extremities w/o difficulty. Affect pleasant     Telemetry   NSR 90s, personally reviewed.   Labs   Basic Metabolic Panel: Recent Labs  Lab 08/26/18 0301 08/27/18 0711 08/28/18 1839 08/29/18 0600 08/30/18 0508  NA 137 132* 135 134* 136  K 3.9 4.2 3.4* 3.9 3.7  CL 100 98 96* 97* 98  CO2 22 25 24  20* 25  GLUCOSE 79 244* 162* 252* 95  BUN 24* 18 15 24* 33*  CREATININE 8.39* 6.07* 5.11* 6.12*  8.38*  CALCIUM 8.3* 7.8* 9.5 8.5* 8.1*    Liver Function Tests: Recent Labs  Lab 08/28/18 1839  AST 35  ALT 21  ALKPHOS 120  BILITOT 1.2  PROT 8.1  ALBUMIN 3.6   Recent Labs  Lab 08/28/18 1839  LIPASE 30   No results for input(s): AMMONIA in the last 168 hours.  CBC: Recent Labs  Lab 08/26/18 0301 08/27/18 0711 08/28/18 1839 08/29/18 0600 08/30/18 0508  WBC 15.3* 14.0* 26.4* 24.8* 14.0*  NEUTROABS  --   --  23.7*  --   --   HGB 8.9* 8.5* 11.2* 10.0* 9.2*  HCT 28.2* 27.9* 35.5* 31.8* 28.7*  MCV 93.1 94.9 92.4 94.1 94.1  PLT 359 310 333 332 289    INR: Recent Labs  Lab 08/26/18 0301 08/27/18 0711 08/28/18 1839 08/29/18 0600 08/30/18 0508  INR 3.36 2.49 1.32 1.37 1.93    Other results:  EKG:    Imaging   Ct Abdomen Pelvis Wo Contrast  Result Date: 08/28/2018 CLINICAL DATA:  History of Crohn's in dialysis recurrent nausea and vomiting. EXAM: CT ABDOMEN AND PELVIS WITHOUT CONTRAST TECHNIQUE: Multidetector CT imaging of the abdomen and pelvis was performed following the standard protocol without IV contrast. COMPARISON:  03/13/2018 FINDINGS: Lower chest: Partially included left ventricular assist device is identified with stable cardiomegaly. Subpleural atelectasis and/or  scarring is seen at each lung base. No effusion or pneumothorax. Hepatobiliary: No focal liver abnormality is seen. No gallstones, gallbladder wall thickening, or biliary dilatation. Pancreas: Unremarkable. No pancreatic ductal dilatation or surrounding inflammatory changes. Spleen: Normal in size without focal abnormality. Adrenals/Urinary Tract: Normal bilateral adrenal glands. No nephrolithiasis nor obstructive uropathy. Urinary bladder is partially decompressed in appearance but unremarkable. Stomach/Bowel: Mild liquid stool within the colon suspicious for diarrheal disease. Right colectomy with chain sutures noted in the right hemiabdomen. No bowel obstruction. Moderate distention the  stomach with ingested food. Normal small bowel rotation is noted. Vascular/Lymphatic: Aortoiliac atherosclerosis. No lymphadenopathy. Reproductive: Size prostate. Other: Free air nor free fluid. Periumbilical fat containing hernia. Musculoskeletal: Midline ventral scarring. A percutaneous catheter is noted from right lower quadrant approach with tip extending to the left upper quadrant. This is partially obscured by streak artifacts from the patient's left ventricular assist device. IMPRESSION: 1. Mild liquid stool within the colon suspicious for diarrheal disease. Right hemicolectomy. No bowel obstruction or inflammation. 2. Aortoiliac atherosclerosis. Electronically Signed   By: Ashley Royalty M.D.   On: 08/28/2018 20:26   Dg Chest Port 1 View  Result Date: 08/29/2018 CLINICAL DATA:  Ischemic cardiomyopathy. Congestive heart failure. Central line placement. EXAM: PORTABLE CHEST 1 VIEW COMPARISON:  08/22/2018 FINDINGS: The heart size and mediastinal contours are within normal limits. Left ventricular assist device and AICD are again seen. Left jugular dual-lumen central venous dialysis catheter is in stable position. A new right jugular central venous catheter is seen with tip overlying the proximal right atrium, approximately 1.8 cm below the superior cavoatrial junction. No evidence of pneumothorax. Low lung volumes are again noted, however both lungs remain clear. Previous median sternotomy. IMPRESSION: 1. New right jugular central venous catheter tip overlies the proximal right atrium, approximately 1.8 cm below the superior cavoatrial junction. No evidence of pneumothorax. 2. Stable low lung volumes. No active lung disease. Electronically Signed   By: Earle Gell M.D.   On: 08/29/2018 13:55     Medications:     Scheduled Medications: . amiodarone  200 mg Oral Daily  . aspirin EC  81 mg Oral Daily  . atorvastatin  40 mg Oral q1800  . calcitRIOL  0.5 mcg Oral Q M,W,F  . ciprofloxacin  500 mg Oral  Q24H  . citalopram  20 mg Oral Daily  . dronabinol  2.5 mg Oral BID AC  . erythromycin  250 mg Oral TID WC  . insulin aspart  0-15 Units Subcutaneous TID WC  . insulin aspart  0-5 Units Subcutaneous QHS  . insulin aspart  5 Units Subcutaneous TID WC  . insulin glargine  10 Units Subcutaneous Daily  . isosorbide-hydrALAZINE  1 tablet Oral 3 times per day on Sun Tue Thu Sat  . metoCLOPramide  10 mg Oral TID WC  . multivitamin  1 tablet Oral QHS  . pantoprazole  40 mg Oral BID  . sevelamer carbonate  4,000 mg Oral TID WC  . Warfarin - Pharmacist Dosing Inpatient   Does not apply q1800    Infusions: . heparin 1,350 Units/hr (08/30/18 0650)    PRN Medications: acetaminophen, docusate sodium, hydrALAZINE, lidocaine-prilocaine, LORazepam, LORazepam, ondansetron (ZOFRAN) IV, promethazine, sevelamer carbonate, traMADol   Patient Profile   [name] is a 52 y.o. malewith history of CAD s/p anterior MI in 2010 and NSTEMI in 3/18 with DES to pRCA and mLAD, diabetic, gastroparesis, Chrons disease, DMIIand ischemic cardiomyopathy, 10/2018s/p Heartmate 3 LVAD(emergent exchange 02/2018 due to  outflow graft kink)and ESRD(HD MWF). He has LUE AVR 05/2018.  Admitted 12/24 with recurrent N/V/D and subtherapeutic INR and elevated LDH   Assessment/Plan:    1. Recurrent nauseas/vomitting due to diabetic gastroparesis: Difficult to manage. Now with very frequent readmits. Also had component of diarrhea today but doubt c. Diff. Ab CT essentially unremarkable in ER. He has seen gastroparesis [facility]. Surprisingly, gastric emptying study was normal. However, electrogastrogram showed poor gastric accomodation/capacity. Therefore, small frequent meals recommended. Also morning nausea thought to be GERD and Protonix was increased.  - Had watery diarrhea with WBC 26.4k. Stool does not seem like c.diff.  - WBC 26.4 > 24.8 > 14 on Cipro for possible bacterial gastroenteritis.  -  Stool Cx pending.  - AB CT ok on 12/24 - Continue reglan 5 mg tid/ac, chronic erythromycin, and Marinol.  - Continue Protonix BID. - Continue to use phenergan prn. Works much better than Zofran.  - Make effort to eat small, more frequent meals.   2.. Elevated LDH and Subtherapeutic INR - INR 1.93 on heparin. Dosing per pharmacy.  - LDH 427 -> 373 ->284. Doubt pump clot. Not dark urine or power spikes. Continue heparin  3. Chronic systolic CHF s/p HM-3 VAD: Ischemic cardiomyopathy. St Jude ICD. NSTEMI in March 2018 with DES to LAD and RCA, complicated by cardiogenic shock, low output requiring milrinone. Echo 8/18 with EF 20-25%, moderate MR. CPX (8/18) with severe functional impairment due to HF. He is s/p HM3 LVAD placement. He had pump thrombosis 6/19 due to [facility] outflow graft kinking. He had pump exchange for another HM3.RHC showed low filling pressures and good cardiac output 08/09/18   -Recently dry weight increased. This has helped his flows, but not his overall debility.  - Volume status OK. Per HD.  - Continue Bidil 1 tabTIDon non-HD days.  - VAD interrogated personally. Parameters stable.   [facility] management as above - Continue ASA.  - He is now listed for heart/kidney at [facility]. [place] 08/09/18 with optimized filling pressures and cardiac output. Normal PA pressure.   4. ESRD: Developed peri-operatively after HM3 placement. He is tolerating HD, currently via catheter. Hematoma post-AV LUE graft placement. Graft now nonfunctional   -Coumadin was restarted 12/12. INR 1.93. Goal 2.0 - 2.5. Should be able to stop heparin today.  - MWF HD but had HD on Tuesday due to Christmas. Renal aware of admission, planning Hd tomorrow.   5. LUE hematoma - Resolving. Assessed by OT and VVS earlier this week.   6. CAD: NSTEMI in 3/18. LHC with 99% ulcerated lesion proximal RCA with left to right collaterals, 95% mid LAD stenosis after mid LAD stent. s/p PCI to RCA and LAD on  11/25/16.  - No s/s of ischemia.    - Continue statin.  7. DM:  - HgbA1c well-controlled. No chang.e  - Cover with SSI while in house.   8. HTN:  - BP relatively stable for non-HD day.  - Continue Bidil on non HD days.  - continue prn hydral for MAP > 100.   9. Anemia: Received 1PURBc 12/9.  - Hgb 10 -> 9.2 today on heparin. Follow.   Relatively stable today. Should likely watch until after HD tomorrow to see how he tolerates.  I reviewed the LVAD parameters from today, and compared the results to the patient's prior recorded data.  No programming changes were made.  The LVAD is functioning within specified parameters.  The patient performs LVAD self-test daily.  LVAD interrogation was negative for any significant power changes, alarms or PI events/speed drops.  LVAD equipment check completed and is in good working order.  Back-up equipment present.   LVAD education done on emergency procedures and precautions and reviewed exit site care.   Length of Stay: 2  Annamaria Helling 08/30/2018, 7:28 AM  VAD Team --- VAD ISSUES ONLY--- Pager [phone] (7am - 7am)  Advanced Heart Failure Team  Pager [phone] (M-F; 7a - 4p)  Please contact [facility] for night-coverage after hours (4p -7a ) and weekends on [email or web address]  Patient seen and examined with the above-signed Advanced Practice Provider and/or Housestaff. I personally reviewed laboratory data, imaging studies and relevant notes. I independently examined the patient and formulated the important aspects of the plan. I have edited the note to reflect any of my changes or salient points. I have personally discussed the plan with the patient and/or family.  Looks ok today. Less nausea. No further diarrhea. WBC coming down. No stool cx sent. Volume status looks good. On heparin and coumadin. INR 1.93. Will stop coumadin. D/w Dr Jimmy Footman. Will plan HD in am . LDH down. VAD interrogated personally. Parameters stable.  [name], MD  9:52 AM

## 2018-08-30 NOTE — Progress Notes (Signed)
[place] for heparin Indication: LVAD/Factor 5 Leiden  Allergies  Allergen Reactions  . Metformin And Related Diarrhea    Patient Measurements: Height: 5' 7"  (170.2 cm) Weight: 174 lb 9.7 oz (79.2 kg) IBW/kg (Calculated) : 66.1  Vital Signs: Temp: 98.2 F (36.8 C) (12/26 0337) Temp Source: Oral (12/26 0337) BP: 95/74 (12/26 0337) Pulse Rate: 83 (12/26 0337)  Labs: Recent Labs    08/28/18 1839 08/29/18 0600 08/30/18 0508  HGB 11.2* 10.0* 9.2*  HCT 35.5* 31.8* 28.7*  PLT 333 332 289  LABPROT 16.2* 16.7* 21.8*  INR 1.32 1.37 1.93  HEPARINUNFRC  --   --  1.30*  CREATININE 5.11* 6.12* 8.38*    Estimated Creatinine Clearance: 9.5 mL/min (A) (by C-G formula based on SCr of 8.38 mg/dL (H)).   Medical History: Past Medical History:  Diagnosis Date  . Angina   . ASCVD (arteriosclerotic cardiovascular disease)    , Anterior infarction 2005, LAD diagonal bifurcation intervention 03/2004  . Automatic implantable cardiac defibrillator -St. Jude's       . Benign neoplasm of colon   . CHF (congestive heart failure) ([place])   . Chronic systolic heart failure ([place])   . Coronary artery disease     Widely patent previously placed stents in the left anterior   . Crohn's disease ([place])   . 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 Dialysis patient ([place])   . Dyspnea   . Gastroparesis   . GERD (gastroesophageal reflux disease)   . High cholesterol   . Hyperlipidemia   . Hypersomnolent    Previous diagnosis of narcolepsy  . Hypertension, essential   . Ischemic cardiomyopathy    Ejection fraction 15-20% catheterization 2010  . Type II or unspecified type diabetes mellitus without mention of complication, not stated as uncontrolled   . Unspecified gastritis and gastroduodenitis without mention of hemorrhage     Medications:  Scheduled:   Assessment: 10 YOM with LVAD on warfarin PTA, now suspected LVAD thrombus and  pharmacy consulted to start heparin and continue warfarin. Of note INR was supratherapeutic on 12/22. INR on admission subtherapeutic at 1.32, last dose 12/23.  Recently admitted with a therapeutic heparin rate of 1550 units/hr which we will start.  Heparin level was not drawn yesterday after multiple attempts.  Heparin level this am 1.3 units/ml     Goal of Therapy:  Heparin level 0.3-0.7 units/ml Monitor platelets by anticoagulation protocol: Yes   Plan:  Decrease heparin drip to 1350 units/hr F/u heparin level 6-8 hours after rate change Daily INR, heparin level, CBC, s/s bleeding  Thanks for allowing pharmacy to be a part of this patient's care.  Excell Seltzer, PharmD Clinical Pharmacist

## 2018-08-30 NOTE — Progress Notes (Signed)
Left AVG study completed. See preliminary findings under CV proc tab.Oda Cogan, BS, RDMS, RVT

## 2018-08-30 NOTE — Plan of Care (Signed)

## 2018-08-30 NOTE — Progress Notes (Addendum)
Pt graft appears patent with +doppler signal present.  Unable to palpate thrill or auscultate bruit  He does have radial, ulnar and palmar arch doppler signals but continues to have numbness in his 1st and 2nd fingers.    Continue to monitor.  Leontine Locket, [facility] 08/30/2018 10:11 AM  I have seen and evaluated the patient. I agree with the PA note as documented above. Will get a graft duplex to confirm if patent given the characteristic of the signal.  If occluded, would probably not be aggressive about intervention since were discussing graft ligation earlier this week with concern for steal.    Marty Heck, MD [facility] Office: [phone] Pager: [phone]

## 2018-08-30 NOTE — Progress Notes (Signed)
LVAD Coordinator Rounding Note:  Pt admitted per Dr. Aundra Dubin 08/28/18 for nausea, vomiting, diarrhea, and low INR.  HM III LVAD implanted on 06/12/17 by Dr. Prescott Gum under Destination Therapy criteria due to renal insufficiency. Pump exchange on 03/01/18 for outflow graft twist/obstruction.   Pt sitting up in bed. He says he is feeling a lot better this morning.   Vital signs: Temp:  98.7 HR:  94 Doppler Pressure:  88 Auto BP:  107/87 (93) O2 Sat: 98% on RA Wt: 180>179.4>178.5>176.5>181 lbs  LVAD interrogation reveals:  Speed: 5500 Flow: 3.9 Power: 4.5w PI: 4.0 Alarms: none Events: rare w/ outlier on 12/22 w/20+ PI Hematocrit: 29  Fixed speed: 5500 Low speed limit: 5200  Drive Line:  CDI. Weekly dressing kits.  Next dressing change due 08/31/18; bedside nurse may change dressing.   Labs: LDH trend: 427>373>285  INR trend: 1.32>1.37>1.93  WBC trend: 26.4> 24.8>14.0  Infection:   Gtts: - 08/28/18 > IV heparin per PharmD > stopped 08/30/18  Anticoagulation Plan: - INR Goal: 2.0 - 3.0 - ASA Dose: 81 mg daily   Device: -St Jude dual  -Therapies: on 200 bpm - Monitor: on VT 150 bpm  Adverse Events on VAD: - ESRD post LVAD; started CVVH 06/15/17; HD started 06/20/17 - Hospitalization 11/5 - 07/16/17 > gastroparesis diagnosis after EGD - Hospitalization 11/26 - 08/04/17 > gastroparesis - Hospitalization 1/8 - 09/16/17>gastroparesis - referred to Dr. Corliss Parish at [facility] - 10/31/17>rightupper arm arteriovenous graft with Artegraft; ligation of right first stage brachial vein transposition  - 12/11/13 elective admission for thrombectomy for AV graft in RUA  - 12/20/17 > intractable N/V - 01/19/18 > intractable N/V sent by EMS from dialysis center - 03/01/18 > outflow graft twist/occulsion requiring pump exchange - 05/23/18 > admitted for non-working fistula in RUA - 06/10/18 > admitted for intractable N/V - 08/08/18 > admitted for peri-op management for 2nd stage of AV fistula,  found to be sclerotic. New LUE graft created from brachial artery to axillary vein. Developed spontaneous LUE hematoma.  - 08/22/18 > admitted for intractable N/V - 08/28/18> admitted for intractable N/V/D and low INR  Plan/Recommendations:  1. Please page VAD coordinator with any equipment questions or concerns. 2. Weekly VAD dressing changes - due 08/31/18, bedside nurse to perform.   [name] RN [place] Coordinator  Office: [phone]  24/7 Pager: [phone]

## 2018-08-31 LAB — CBC
HCT: 26.7 % — ABNORMAL LOW (ref 39.0–52.0)
Hemoglobin: 8.2 g/dL — ABNORMAL LOW (ref 13.0–17.0)
MCH: 28.6 pg (ref 26.0–34.0)
MCHC: 30.7 g/dL (ref 30.0–36.0)
MCV: 93 fL (ref 80.0–100.0)
NRBC: 0 % (ref 0.0–0.2)
Platelets: 269 10*3/uL (ref 150–400)
RBC: 2.87 MIL/uL — ABNORMAL LOW (ref 4.22–5.81)
RDW: 16.9 % — AB (ref 11.5–15.5)
WBC: 10.9 10*3/uL — ABNORMAL HIGH (ref 4.0–10.5)

## 2018-08-31 LAB — BASIC METABOLIC PANEL
Anion gap: 12 (ref 5–15)
BUN: 39 mg/dL — ABNORMAL HIGH (ref 6–20)
CO2: 26 mmol/L (ref 22–32)
Calcium: 7.8 mg/dL — ABNORMAL LOW (ref 8.9–10.3)
Chloride: 97 mmol/L — ABNORMAL LOW (ref 98–111)
Creatinine, Ser: 10.26 mg/dL — ABNORMAL HIGH (ref 0.61–1.24)
GFR calc non Af Amer: 5 mL/min — ABNORMAL LOW (ref >60)
GFR, EST AFRICAN AMERICAN: 6 mL/min — AB (ref >60)
Glucose, Bld: 115 mg/dL — ABNORMAL HIGH (ref 70–99)
Potassium: 3.9 mmol/L (ref 3.5–5.1)
SODIUM: 135 mmol/L (ref 135–145)

## 2018-08-31 LAB — HEPARIN LEVEL (UNFRACTIONATED): Heparin Unfractionated: 0.62 IU/mL (ref 0.30–0.70)

## 2018-08-31 LAB — GLUCOSE, CAPILLARY
Glucose-Capillary: 171 mg/dL — ABNORMAL HIGH (ref 70–99)
Glucose-Capillary: 211 mg/dL — ABNORMAL HIGH (ref 70–99)
Glucose-Capillary: 183 mg/dL — ABNORMAL HIGH (ref 70–99)

## 2018-08-31 LAB — LACTATE DEHYDROGENASE: LDH: 259 U/L — ABNORMAL HIGH (ref 98–192)

## 2018-08-31 LAB — PROTIME-INR
INR: 1.78
Prothrombin Time: 20.4 seconds — ABNORMAL HIGH (ref 11.4–15.2)

## 2018-08-31 MED ORDER — SODIUM CHLORIDE 0.9 % IV SOLN: 100.0000 mL | INTRAVENOUS | Status: DC | PRN

## 2018-08-31 MED ORDER — ISOSORB DINITRATE-HYDRALAZINE 20-37.5 MG PO TABS
1.0000 | ORAL_TABLET | ORAL | Status: DC
  Administered 2018-09-01: 1 via ORAL
  Filled 2018-08-31 (×2): qty 1

## 2018-08-31 MED ORDER — ONDANSETRON HCL 4 MG/2ML IJ SOLN
INTRAMUSCULAR | Status: AC
  Filled 2018-08-31: qty 2

## 2018-08-31 MED ORDER — WARFARIN SODIUM 7.5 MG PO TABS
7.5000 mg | ORAL_TABLET | Freq: Once | ORAL | Status: AC
  Administered 2018-08-31: 7.5 mg via ORAL
  Filled 2018-08-31: qty 1

## 2018-08-31 MED ORDER — HEPARIN SODIUM (PORCINE) 1000 UNIT/ML IJ SOLN
INTRAMUSCULAR | Status: AC
  Filled 2018-08-31: qty 4

## 2018-08-31 MED ORDER — HEPARIN (PORCINE) 25000 UT/250ML-% IV SOLN
1250.0000 [IU]/h | INTRAVENOUS | Status: DC
  Administered 2018-08-31 – 2018-09-01 (×2): 1350 [IU]/h via INTRAVENOUS
  Filled 2018-08-31 (×2): qty 250

## 2018-08-31 NOTE — Progress Notes (Signed)
[place] for heparin/warfarin Indication: LVAD/Factor 5 Leiden  Allergies  Allergen Reactions  . Metformin And Related Diarrhea    Patient Measurements: Height: 5' 7"  (170.2 cm) Weight: 181 lb (82.1 kg) IBW/kg (Calculated) : 66.1  Vital Signs: Temp: 98.5 F (36.9 C) (12/27 0406) Temp Source: Oral (12/27 0406) BP: 130/93 (12/27 0406) Pulse Rate: 86 (12/27 0406)  Labs: Recent Labs    08/29/18 0600 08/30/18 0508 08/31/18 0530  HGB 10.0* 9.2* 8.2*  HCT 31.8* 28.7* 26.7*  PLT 332 289 269  LABPROT 16.7* 21.8* 20.4*  INR 1.37 1.93 1.78  HEPARINUNFRC  --  1.30*  --   CREATININE 6.12* 8.38* 10.26*    Estimated Creatinine Clearance: 8.5 mL/min (A) (by C-G formula based on SCr of 10.26 mg/dL (H)).   Medical History: Past Medical History:  Diagnosis Date  . Angina   . ASCVD (arteriosclerotic cardiovascular disease)    , Anterior infarction 2005, LAD diagonal bifurcation intervention 03/2004  . Automatic implantable cardiac defibrillator -St. Jude's       . Benign neoplasm of colon   . CHF (congestive heart failure) ([place])   . Chronic systolic heart failure ([place])   . Coronary artery disease     Widely patent previously placed stents in the left anterior   . Crohn's disease ([place])   . 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 Dialysis patient ([place])   . Dyspnea   . Gastroparesis   . GERD (gastroesophageal reflux disease)   . High cholesterol   . Hyperlipidemia   . Hypersomnolent    Previous diagnosis of narcolepsy  . Hypertension, essential   . Ischemic cardiomyopathy    Ejection fraction 15-20% catheterization 2010  . Type II or unspecified type diabetes mellitus without mention of complication, not stated as uncontrolled   . Unspecified gastritis and gastroduodenitis without mention of hemorrhage     Assessment: 50 yoM with LVAD on warfarin PTA, now suspected LVAD thrombus and pharmacy consulted to start  heparin and continue warfarin. INR supratherapeutic on 12/22 and then on readmission subtherapeutic at 1.32. Pt started on IV heparin while INR low which was stopped yesterday. INR down again this morning so will resume heparin. LDH stable, CBC down but no S/Sx bleeding.   PTA warfarin dosing = 57m daily except 7.541mon Mondays  Goal of Therapy:  INR 2-3 Heparin level 0.3-0.7 units/ml Monitor platelets by anticoagulation protocol: Yes   Plan:  -Resume heparin 1350 units/hr -Check 8hr heparin level -Warfarin 7.29m30mO x1 tonight -Daily INR, LDH, CBC  MicArrie SenateharmD, BCPS Clinical Pharmacist [phone] check AMION for all [facility] [place] 08/31/2018

## 2018-08-31 NOTE — Procedures (Signed)
Patient completed trmt as ordered.  During HD trmt on 2 separate occasions (and witnessed by 2 different RN's) patient seen sticking his fingers down his throat.  On the first occasion, asked patient why he would do this.  He stated he "just wanted to throw up, it might make me feel better."  Patient educated on complications of forced emesis, including Mallory Weiss tears while pt is on coumadin.  He verbalized understanding, however, was noted again approx 1 hr later repeating the same behavior.  Very restless and anxious during trmt, did receive ativan pre trmt.

## 2018-08-31 NOTE — Progress Notes (Addendum)
Advanced Heart Failure VAD Team Note  PCP-Cardiologist: No primary care provider on file.   Subjective:    Nauseated this am on HD. No relief so far with phenergan and Ativan. About to get Zofran. Denies bleeding in stools, worsening hematoma, or hematemesis. Hand strength slowly improving.   INR 1.78. Heparin stopped yesterday. Hgb 11.2 -> 8.2 over 3 days. No overt bleeding.   LDH 427 -> 373 -> 285 -> 259  LVAD Interrogation HM 3: Speed: 5500 Flow: 3.6 PI: 4.8 Power: 4.0. VAD interrogated personally. Parameters stable.    Objective:    Vital Signs:   Temp:  [97.6 F (36.4 C)-99.1 F (37.3 C)] 98.5 F (36.9 C) (12/27 0406) Pulse Rate:  [66-96] 86 (12/27 0406) Resp:  [14-20] 16 (12/27 0406) BP: (101-130)/(61-96) 130/93 (12/27 0406) SpO2:  [96 %-100 %] 100 % (12/27 0406) Weight:  [82.1 kg] 82.1 kg (12/27 0406) Last BM Date: 08/28/18 Mean arterial Pressure 90-100s    Intake/Output:  No intake or output data in the 24 hours ending 08/31/18 0754   Physical Exam    General: Lying in bed. NAD.  HEENT: Normal. Neck: Supple, JVP 7-8 cm. Carotids OK.  Cardiac:  Mechanical heart sounds with LVAD hum present.  Lungs:  CTAB, normal effort.  Abdomen:  NT, ND, no HSM. No bruits or masses. +BS  LVAD exit site: Well-healed and incorporated. Dressing dry and intact. No erythema or drainage. Stabilization device present and accurately applied. Driveline dressing changed daily per sterile technique. Extremities:  Warm and dry. No cyanosis, clubbing, rash, or edema.  Neuro:  Alert & oriented x 3. Cranial nerves grossly intact. Moves all 4 extremities w/o difficulty. Affect pleasant       Telemetry   NSR 80-90s, personally reviewed.   Labs   Basic Metabolic Panel: Recent Labs  Lab 08/27/18 0711 08/28/18 1839 08/29/18 0600 08/30/18 0508 08/31/18 0530  NA 132* 135 134* 136 135  K 4.2 3.4* 3.9 3.7 3.9  CL 98 96* 97* 98 97*  CO2 25 24 20* 25 26  GLUCOSE 244* 162* 252* 95 115*    BUN 18 15 24* 33* 39*  CREATININE 6.07* 5.11* 6.12* 8.38* 10.26*  CALCIUM 7.8* 9.5 8.5* 8.1* 7.8*    Liver Function Tests: Recent Labs  Lab 08/28/18 1839  AST 35  ALT 21  ALKPHOS 120  BILITOT 1.2  PROT 8.1  ALBUMIN 3.6   Recent Labs  Lab 08/28/18 1839  LIPASE 30   No results for input(s): AMMONIA in the last 168 hours.  CBC: Recent Labs  Lab 08/27/18 0711 08/28/18 1839 08/29/18 0600 08/30/18 0508 08/31/18 0530  WBC 14.0* 26.4* 24.8* 14.0* 10.9*  NEUTROABS  --  23.7*  --   --   --   HGB 8.5* 11.2* 10.0* 9.2* 8.2*  HCT 27.9* 35.5* 31.8* 28.7* 26.7*  MCV 94.9 92.4 94.1 94.1 93.0  PLT 310 333 332 289 269    INR: Recent Labs  Lab 08/27/18 0711 08/28/18 1839 08/29/18 0600 08/30/18 0508 08/31/18 0530  INR 2.49 1.32 1.37 1.93 1.78    Other results:  EKG:    Imaging   Dg Chest Port 1 View  Result Date: 08/29/2018 CLINICAL DATA:  Ischemic cardiomyopathy. Congestive heart failure. Central line placement. EXAM: PORTABLE CHEST 1 VIEW COMPARISON:  08/22/2018 FINDINGS: The heart size and mediastinal contours are within normal limits. Left ventricular assist device and AICD are again seen. Left jugular dual-lumen central venous dialysis catheter is in stable position. A new  right jugular central venous catheter is seen with tip overlying the proximal right atrium, approximately 1.8 cm below the superior cavoatrial junction. No evidence of pneumothorax. Low lung volumes are again noted, however both lungs remain clear. Previous median sternotomy. IMPRESSION: 1. New right jugular central venous catheter tip overlies the proximal right atrium, approximately 1.8 cm below the superior cavoatrial junction. No evidence of pneumothorax. 2. Stable low lung volumes. No active lung disease. Electronically Signed   By: Earle Gell M.D.   On: 08/29/2018 13:55   Vas US Duplex Dialysis Access (avf, Avg)  Result Date: 08/30/2018 DIALYSIS ACCESS Reason for Exam: No palpable thrill  for AVF/AVG. Access Type: Upper arm loop AVG. History: 08/09/18 - INSERTION OF AV GORE-TEX GRAFT LEFT UPPER ARM. Performing Technologist: Oda Cogan RDMS, RVT  Examination Guidelines: A complete evaluation includes B-mode imaging, spectral Doppler, color Doppler, and power Doppler as needed of all accessible portions of each vessel. Unilateral testing is considered an integral part of a complete examination. Limited examinations for reoccurring indications may be performed as noted.  Findings:  Two large heterogenous structures along the mid upper arm graft and at the antecubital fossa suggestive of possible hematomas.  +--------------------+----------+-----------------+--------+ AVG                 PSV (cm/s)Flow Vol (mL/min)Describe +--------------------+----------+-----------------+--------+ Arterial anastomosis    0                      OCCLUDED +--------------------+----------+-----------------+--------+ Prox graft              0                      OCCLUDED +--------------------+----------+-----------------+--------+ Mid graft               0                      OCCLUDED +--------------------+----------+-----------------+--------+ Distal graft            0                      OCCLUDED +--------------------+----------+-----------------+--------+  Summary: Left upper arm AV Graft is occluded.  *See table(s) above for measurements and observations.  Diagnosing physician: Monica Martinez MD Electronically signed by Monica Martinez MD on 08/30/2018 at 2:07:54 PM.   --------------------------------------------------------------------------------   Final     Medications:     Scheduled Medications: . amiodarone  200 mg Oral Daily  . aspirin EC  81 mg Oral Daily  . atorvastatin  40 mg Oral q1800  . calcitRIOL  1 mcg Oral Q M,W,F  . Chlorhexidine Gluconate Cloth  6 each Topical Q0600  . ciprofloxacin  500 mg Oral Q24H  . citalopram  20 mg Oral Daily  . dronabinol  2.5 mg  Oral BID AC  . erythromycin  250 mg Oral TID WC  . insulin aspart  0-15 Units Subcutaneous TID WC  . insulin aspart  0-5 Units Subcutaneous QHS  . insulin aspart  5 Units Subcutaneous TID WC  . insulin glargine  10 Units Subcutaneous Daily  . metoCLOPramide  10 mg Oral TID WC  . multivitamin  1 tablet Oral QHS  . pantoprazole  40 mg Oral BID  . sevelamer carbonate  4,000 mg Oral TID WC  . Warfarin - Pharmacist Dosing Inpatient   Does not apply q1800    Infusions:   PRN Medications: acetaminophen, docusate sodium, hydrALAZINE,  LORazepam, LORazepam, ondansetron (ZOFRAN) IV, promethazine, sevelamer carbonate, traMADol  Patient Profile   [name] is a 53 y.o. malewith history of CAD s/p anterior MI in 2010 and NSTEMI in 3/18 with DES to pRCA and mLAD, diabetic, gastroparesis, Chrons disease, DMIIand ischemic cardiomyopathy, 10/2018s/p Heartmate 3 LVAD(emergent exchange 02/2018 due to outflow graft kink)and ESRD(HD MWF). He has LUE AVR 05/2018.  Admitted 12/24 with recurrent N/V/D and subtherapeutic INR and elevated LDH  Assessment/Plan:    1. Recurrent nauseas/vomitting due to diabetic gastroparesis: Difficult to manage. Now with very frequent readmits. Also had component of diarrhea today but doubt c. Diff. Ab CT essentially unremarkable in ER. He has seen gastroparesis specialist at [facility]. Surprisingly, gastric emptying study was normal. However, electrogastrogram showed poor gastric accomodation/capacity. Therefore, small frequent meals recommended. Also morning nausea thought to be GERD and Protonix was increased.  - Had watery diarrhea with WBC 26.4k. Stool does not seem like c.diff.  - WBC 26.4 > 24.8 > 14 > 10.9 on Cipro for possible bacterial gastroenteritis.  - Stool Cx doesn't appear to have been sent.   - AB CT ok on 12/24 - Continue reglan 5 mg tid/ac, chronic erythromycin, and Marinol.  - Continue Protonix BID. - Continue to use phenergan prn. Works  much better than Zofran.  - Make effort to eat small, more frequent meals.   2.. Elevated LDH and Subtherapeutic INR - INR 1.78. Heparin stopped 08/30/18. Dosing per Pharmacy.   - LDH 427 -> 373 -> 284 -> 259. Doubt pump clot. Not dark urine or power spikes.   3. Chronic systolic CHF s/p HM-3 VAD: Ischemic cardiomyopathy. St Jude ICD. NSTEMI in March 2018 with DES to LAD and RCA, complicated by cardiogenic shock, low output requiring milrinone. Echo 8/18 with EF 20-25%, moderate MR. CPX (8/18) with severe functional impairment due to HF. He is s/p HM3 LVAD placement. He had pump thrombosis 6/19 due to [facility] outflow graft kinking. He had pump exchange for another HM3.RHC showed low filling pressures and good cardiac output 08/09/18   -Recently dry weight increased. This has helped his flows, but not his overall debility.  - Volume status OK. For HD this am.  - Continue Bidil 1 tabTIDon non-HD days.  - VAD interrogated personally. Parameters stable.   [facility] management as above - Continue ASA.  - He is now listed for heart/kidney at [facility]. [place] 08/09/18 with optimized filling pressures and cardiac output. Normal PA pressure.   4. ESRD: Developed peri-operatively after HM3 placement. He is tolerating HD, currently via catheter. Hematoma post-AV LUE graft placement. Graft now nonfunctional   -Coumadin was restarted 12/12. INR 1.78. Goal 2.0 - 2.5. Heparin stopped 08/30/18, but dipped back down.  - MWF HD but on Holiday schedule.   5. LUE hematoma - Resolving. Assessed by OT and VVS multiple times this admit.   6. CAD: NSTEMI in 3/18. LHC with 99% ulcerated lesion proximal RCA with left to right collaterals, 95% mid LAD stenosis after mid LAD stent. s/p PCI to RCA and LAD on 11/25/16.  - No s/s of ischemia.    - Continue statin.  7. DM:  - HgbA1c well-controlled. No change.   - Cover with SSI while in house.   8. HTN:  - BP relatively stable for non-HD day.  - Continue  Bidil on non HD days.  - continue prn hydral for MAP > 100.   9. Anemia: Received 1PURBc 12/9.  - Hgb 11 -> 10 ->  9.2 -> 8.2. Heparin stopped 08/30/18. No overt bleeding.   Will wait and see how he settles post HD, possible home today if stable this afternoon.   I reviewed the LVAD parameters from today, and compared the results to the patient's prior recorded data.  No programming changes were made.  The LVAD is functioning within specified parameters.  The patient performs LVAD self-test daily.  LVAD interrogation was negative for any significant power changes, alarms or PI events/speed drops.  LVAD equipment check completed and is in good working order.  Back-up equipment present.   LVAD education done on emergency procedures and precautions and reviewed exit site care.   Length of Stay: 3  Annamaria Helling 08/31/2018, 7:54 AM  VAD Team --- VAD ISSUES ONLY--- Pager [phone] (7am - 7am)  Advanced Heart Failure Team  Pager [phone] (M-F; 7a - 4p)  Please contact [facility] for night-coverage after hours (4p -7a ) and weekends on [email or web address]  Patient seen and examined with the above-signed Advanced Practice Provider and/or Housestaff. I personally reviewed laboratory data, imaging studies and relevant notes. I independently examined the patient and formulated the important aspects of the plan. I have edited the note to reflect any of my changes or salient points. I have personally discussed the plan with the patient and/or family.  Having severe nausea on HD. Not tolerating well. INR also low. We will restart heparin with no bolus. Bump coumadin. Hgb trending down. No melena. Will need to watch at least one more day. Get type and screen. VAD interrogated personally. Parameters stable. LUE hematoma stable. MAPs ok.   [name], MD  8:41 AM

## 2018-08-31 NOTE — Plan of Care (Signed)
Problem: Education: Goal: Knowledge of General Education information will improve Description Including pain rating scale, medication(s)/side effects and non-pharmacologic comfort measures Outcome: Completed/Met   Problem: Health Behavior/Discharge Planning: Goal: Ability to manage health-related needs will improve Outcome: Completed/Met   Problem: Safety: Goal: Ability to remain free from injury will improve Outcome: Completed/Met   Problem: Education: Goal: Patient will understand all VAD equipment and how it functions Outcome: Completed/Met Goal: Patient will be able to verbalize current INR target range and antiplatelet therapy for discharge home Outcome: Completed/Met   Problem: Clinical Measurements: Goal: Ability to maintain clinical measurements within normal limits will improve Outcome: Progressing   Problem: Activity: Goal: Risk for activity intolerance will decrease Outcome: Progressing   Problem: Pain Managment: Goal: General experience of comfort will improve Outcome: Progressing   Problem: Cardiac: Goal: LVAD will function as expected and patient will experience no clinical alarms Outcome: Progressing   Problem: Nutrition: Goal: Adequate nutrition will be maintained Outcome: Not Progressing   Problem: Coping: Goal: Level of anxiety will decrease Outcome: Not Progressing

## 2018-08-31 NOTE — Progress Notes (Signed)
Patient transported to dialysis

## 2018-08-31 NOTE — Progress Notes (Signed)
Subjective: Interval History: has complaints wants to get AVG functional.  Very little numbness in fingers..  Objective: Vital signs in last 24 hours: Temp:  [97.6 F (36.4 C)-99.1 F (37.3 C)] 98.5 F (36.9 C) (12/27 0406) Pulse Rate:  [66-96] 86 (12/27 0406) Resp:  [14-20] 16 (12/27 0406) BP: (101-130)/(61-96) 130/93 (12/27 0406) SpO2:  [96 %-100 %] 100 % (12/27 0406) Weight:  [82.1 kg] 82.1 kg (12/27 0406) Weight change: 2.4 kg  Intake/Output from previous day: No intake/output data recorded. Intake/Output this shift: No intake/output data recorded.  General appearance: alert, cooperative and no distress Resp: clear to auscultation bilaterally Chest wall: L IJ cath Cardio: cont hum of LVAD GI: pos bs, liver down 5 cm.  LVAD tubes exit RUQ Extremities: hematoma around access LUA  Lab Results: Recent Labs    08/30/18 0508 08/31/18 0530  WBC 14.0* 10.9*  HGB 9.2* 8.2*  HCT 28.7* 26.7*  PLT 289 269   BMET:  Recent Labs    08/30/18 0508 08/31/18 0530  NA 136 135  K 3.7 3.9  CL 98 97*  CO2 25 26  GLUCOSE 95 115*  BUN 33* 39*  CREATININE 8.38* 10.26*  CALCIUM 8.1* 7.8*   No results for input(s): PTH in the last 72 hours. Iron Studies: No results for input(s): IRON, TIBC, TRANSFERRIN, FERRITIN in the last 72 hours.  Studies/Results: Dg Chest Port 1 View  Result Date: 08/29/2018 CLINICAL DATA:  Ischemic cardiomyopathy. Congestive heart failure. Central line placement. EXAM: PORTABLE CHEST 1 VIEW COMPARISON:  08/22/2018 FINDINGS: The heart size and mediastinal contours are within normal limits. Left ventricular assist device and AICD are again seen. Left jugular dual-lumen central venous dialysis catheter is in stable position. A new right jugular central venous catheter is seen with tip overlying the proximal right atrium, approximately 1.8 cm below the superior cavoatrial junction. No evidence of pneumothorax. Low lung volumes are again noted, however both lungs  remain clear. Previous median sternotomy. IMPRESSION: 1. New right jugular central venous catheter tip overlies the proximal right atrium, approximately 1.8 cm below the superior cavoatrial junction. No evidence of pneumothorax. 2. Stable low lung volumes. No active lung disease. Electronically Signed   By: Earle Gell M.D.   On: 08/29/2018 13:55   Vas US Duplex Dialysis Access (avf, Avg)  Result Date: 08/30/2018 DIALYSIS ACCESS Reason for Exam: No palpable thrill for AVF/AVG. Access Type: Upper arm loop AVG. History: 08/09/18 - INSERTION OF AV GORE-TEX GRAFT LEFT UPPER ARM. Performing Technologist: Oda Cogan RDMS, RVT  Examination Guidelines: A complete evaluation includes B-mode imaging, spectral Doppler, color Doppler, and power Doppler as needed of all accessible portions of each vessel. Unilateral testing is considered an integral part of a complete examination. Limited examinations for reoccurring indications may be performed as noted.  Findings:  Two large heterogenous structures along the mid upper arm graft and at the antecubital fossa suggestive of possible hematomas.  +--------------------+----------+-----------------+--------+ AVG                 PSV (cm/s)Flow Vol (mL/min)Describe +--------------------+----------+-----------------+--------+ Arterial anastomosis    0                      OCCLUDED +--------------------+----------+-----------------+--------+ Prox graft              0                      OCCLUDED +--------------------+----------+-----------------+--------+ Mid graft  0                      OCCLUDED +--------------------+----------+-----------------+--------+ Distal graft            0                      OCCLUDED +--------------------+----------+-----------------+--------+  Summary: Left upper arm AV Graft is occluded.  *See table(s) above for measurements and observations.  Diagnosing physician: Monica Martinez MD Electronically signed by  Monica Martinez MD on 08/30/2018 at 2:07:54 PM.   --------------------------------------------------------------------------------   Final     I have reviewed the patient's current medications.  Assessment/Plan: 1 ESRD  For HD. Some xs vol 2 Low bp , minimize meds 3 CM with LVAD 4 Access need to see if can maintain patency 5 Anemia worse cont esa 6 Gastroparesis at bay 7 HPTH need Phos level P HD, doppler, esa, follow Hb , hopefully d/c    LOS: 3 days   Jeneen Rinks Edan Juday 08/31/2018,7:03 AM

## 2018-08-31 NOTE — Progress Notes (Signed)
LVAD Coordinator Rounding Note:  Pt admitted per Dr. Aundra Dubin 08/28/18 for nausea, vomiting, diarrhea, and low INR.  HM III LVAD implanted on 06/12/17 by Dr. Prescott Gum under Destination Therapy criteria due to renal insufficiency. Pump exchange on 03/01/18 for outflow graft twist/obstruction.   Pt laying in bed after returning from HD. He is extremely nauseated and says he "feels terrible." RN at bedside administering PRN nausea medication.   Vital signs: Temp:  99.1 HR:  100 Doppler Pressure:  120 Auto BP:  141/99 (112) O2 Sat: 99% on RA Wt: 180>179.4>178.5>176.5>181>180.6 lbs  LVAD interrogation reveals:  Speed: 5500 Flow: 3.4 Power: 4.6w PI: 7.4 Alarms: none Events: 11 PI events today during HD Hematocrit: 29  Fixed speed: 5500 Low speed limit: 5200  Drive Line:  CDI. Weekly dressing kits.  Next dressing change due 08/31/18; bedside nurse may change dressing.   Labs: LDH trend: 427>373>285>259  INR trend: 1.32>1.37>1.93>1.78  WBC trend: 26.4> 24.8>14.0>10.9  Infection:   Gtts: - 08/28/18 > IV heparin per PharmD > stopped 08/30/18  Anticoagulation Plan: - INR Goal: 2.0 - 3.0 - ASA Dose: 81 mg daily   Device: -St Jude dual  -Therapies: on 200 bpm - Monitor: on VT 150 bpm  Adverse Events on VAD: - ESRD post LVAD; started CVVH 06/15/17; HD started 06/20/17 - Hospitalization 11/5 - 07/16/17 > gastroparesis diagnosis after EGD - Hospitalization 11/26 - 08/04/17 > gastroparesis - Hospitalization 1/8 - 09/16/17>gastroparesis - [facility] to [facility]. Corliss Parish at [facility] - 10/31/17>rightupper arm arteriovenous graft with Artegraft; ligation of right first stage brachial vein transposition  - 12/11/13 elective admission for thrombectomy for AV graft in RUA  - 12/20/17 > intractable N/V - 01/19/18 > intractable N/V sent by EMS from dialysis center - 03/01/18 > outflow graft twist/occulsion requiring pump exchange - 05/23/18 > admitted for non-working fistula in RUA - 06/10/18 > admitted  for intractable N/V - 08/08/18 > admitted for peri-op management for 2nd stage of AV fistula, found to be sclerotic. New LUE graft created from brachial artery to axillary vein. Developed spontaneous LUE hematoma.  - 08/22/18 > admitted for intractable N/V - 08/28/18> admitted for intractable N/V/D and low INR  Plan/Recommendations:  1. Please page VAD coordinator with any equipment questions or concerns. 2. Weekly VAD dressing changes - due 08/31/18, bedside nurse to perform.   [name] RN [place] Coordinator  Office: [phone]  24/7 Pager: [phone]

## 2018-08-31 NOTE — Progress Notes (Signed)
[place] for heparin/warfarin Indication: LVAD/Factor 5 Leiden  Allergies  Allergen Reactions  . Metformin And Related Diarrhea    Patient Measurements: Height: 5' 7"  (170.2 cm) Weight: 175 lb 14.8 oz (79.8 kg) IBW/kg (Calculated) : 66.1  Vital Signs: Temp: 99 F (37.2 C) (12/27 1523) Temp Source: Oral (12/27 1523) BP: 111/98 (12/27 1911) Pulse Rate: 109 (12/27 2000)  Labs: Recent Labs    08/29/18 0600 08/30/18 0508 08/31/18 0530 08/31/18 2030  HGB 10.0* 9.2* 8.2*  --   HCT 31.8* 28.7* 26.7*  --   PLT 332 289 269  --   LABPROT 16.7* 21.8* 20.4*  --   INR 1.37 1.93 1.78  --   HEPARINUNFRC  --  1.30*  --  0.62  CREATININE 6.12* 8.38* 10.26*  --     Estimated Creatinine Clearance: 8.4 mL/min (A) (by C-G formula based on SCr of 10.26 mg/dL (H)).   Medical History: Past Medical History:  Diagnosis Date  . Angina   . ASCVD (arteriosclerotic cardiovascular disease)    , Anterior infarction 2005, LAD diagonal bifurcation intervention 03/2004  . Automatic implantable cardiac defibrillator -St. Jude's       . Benign neoplasm of colon   . CHF (congestive heart failure) ([place])   . Chronic systolic heart failure ([place])   . Coronary artery disease     Widely patent previously placed stents in the left anterior   . Crohn's disease ([place])   . 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 Dialysis patient ([place])   . Dyspnea   . Gastroparesis   . GERD (gastroesophageal reflux disease)   . High cholesterol   . Hyperlipidemia   . Hypersomnolent    Previous diagnosis of narcolepsy  . Hypertension, essential   . Ischemic cardiomyopathy    Ejection fraction 15-20% catheterization 2010  . Type II or unspecified type diabetes mellitus without mention of complication, not stated as uncontrolled   . Unspecified gastritis and gastroduodenitis without mention of hemorrhage     Assessment: 62 yoM with LVAD on warfarin PTA, now  suspected LVAD thrombus and pharmacy consulted to start heparin and continue warfarin. INR supratherapeutic on 12/22 and then on readmission subtherapeutic at 1.32. Pt started on IV heparin while INR low which was stopped yesterday. INR down again this morning so will resume heparin. LDH stable, CBC down but no S/Sx bleeding.   PTA warfarin dosing = 25m daily except 7.569mon Mondays  Heparin level this evening is at goal.  No overt bleeding or complications noted.  Goal of Therapy:  INR 2-3 Heparin level 0.3-0.7 units/ml Monitor platelets by anticoagulation protocol: Yes   Plan:  -Continue heparin at current rate. -Confirm heparin level with AM labs. -Daily INR, LDH, CBC  JeMarguerite OleaBCThe Endoscopy Center Eastlinical Pharmacist Phone ([phone]/27/2019 10:02 PM

## 2018-08-31 NOTE — Procedures (Signed)
I was present at this session.  I have reviewed the session itself and made appropriate changes.  HD via PC.  bp mildy ^ to start.  Jeneen Rinks Jaelin Fackler 12/27/20198:19 AM

## 2018-09-01 LAB — CBC
HCT: 29.2 % — ABNORMAL LOW (ref 39.0–52.0)
Hemoglobin: 9 g/dL — ABNORMAL LOW (ref 13.0–17.0)
MCH: 28.9 pg (ref 26.0–34.0)
MCHC: 30.8 g/dL (ref 30.0–36.0)
MCV: 93.9 fL (ref 80.0–100.0)
NRBC: 0 % (ref 0.0–0.2)
Platelets: 287 10*3/uL (ref 150–400)
RBC: 3.11 MIL/uL — ABNORMAL LOW (ref 4.22–5.81)
RDW: 17.2 % — ABNORMAL HIGH (ref 11.5–15.5)
WBC: 14.1 10*3/uL — ABNORMAL HIGH (ref 4.0–10.5)

## 2018-09-01 LAB — PROTIME-INR
INR: 2.01
PROTHROMBIN TIME: 22.5 s — AB (ref 11.4–15.2)

## 2018-09-01 LAB — HEPARIN LEVEL (UNFRACTIONATED): Heparin Unfractionated: 0.73 IU/mL — ABNORMAL HIGH (ref 0.30–0.70)

## 2018-09-01 LAB — BASIC METABOLIC PANEL
Anion gap: 13 (ref 5–15)
BUN: 21 mg/dL — ABNORMAL HIGH (ref 6–20)
CO2: 25 mmol/L (ref 22–32)
Calcium: 8.2 mg/dL — ABNORMAL LOW (ref 8.9–10.3)
Chloride: 98 mmol/L (ref 98–111)
Creatinine, Ser: 5.8 mg/dL — ABNORMAL HIGH (ref 0.61–1.24)
GFR calc Af Amer: 12 mL/min — ABNORMAL LOW (ref >60)
GFR calc non Af Amer: 10 mL/min — ABNORMAL LOW (ref >60)
Glucose, Bld: 212 mg/dL — ABNORMAL HIGH (ref 70–99)
Potassium: 3.9 mmol/L (ref 3.5–5.1)
Sodium: 136 mmol/L (ref 135–145)

## 2018-09-01 LAB — LACTATE DEHYDROGENASE: LDH: 270 U/L — ABNORMAL HIGH (ref 98–192)

## 2018-09-01 LAB — GLUCOSE, CAPILLARY
GLUCOSE-CAPILLARY: 174 mg/dL — AB (ref 70–99)
GLUCOSE-CAPILLARY: 116 mg/dL — AB (ref 70–99)
Glucose-Capillary: 92 mg/dL (ref 70–99)
Glucose-Capillary: 148 mg/dL — ABNORMAL HIGH (ref 70–99)

## 2018-09-01 MED ORDER — WARFARIN SODIUM 7.5 MG PO TABS
7.5000 mg | ORAL_TABLET | Freq: Once | ORAL | Status: AC
  Administered 2018-09-01: 7.5 mg via ORAL
  Filled 2018-09-01: qty 1

## 2018-09-01 MED ORDER — CHLORHEXIDINE GLUCONATE CLOTH 2 % EX PADS
6.0000 | MEDICATED_PAD | Freq: Every day | CUTANEOUS | Status: DC
  Administered 2018-09-01: 6 via TOPICAL

## 2018-09-01 NOTE — Progress Notes (Signed)
Advanced Heart Failure VAD Team Note  PCP-Cardiologist: No primary care provider on file.   Subjective:    Says he had a very rough day with HD yesterday and intractable nausea. Was sticking his finger down his throat trying to get relief.   Feels better today but still weak. Able to eat a little bit. No PND or orthopnea. No diarrhea  On heparin no bleeding. INR 2.01 this am  LVAD Interrogation HM 3: Speed: 5500 Flow: 4.6 PI: 2.6 Power: 4.4. 1PI event overnight. VAD interrogated personally. Parameters stable.   Objective:    Vital Signs:   Temp:  [97.5 F (36.4 C)-99.1 F (37.3 C)] 97.5 F (36.4 C) (12/28 0729) Pulse Rate:  [59-113] 80 (12/28 0729) Resp:  [11-24] 11 (12/28 0729) BP: (87-144)/(48-103) 116/89 (12/28 0729) SpO2:  [97 %-100 %] 98 % (12/28 0729) Weight:  [78.2 kg-79.8 kg] 78.2 kg (12/28 0232) Last BM Date: 08/28/18 Mean arterial Pressure 80-90s   Intake/Output:   Intake/Output Summary (Last 24 hours) at 09/01/2018 0957 Last data filed at 09/01/2018 0924 Gross per 24 hour  Intake 825.42 ml  Output 2769 ml  Net -1943.58 ml     Physical Exam    General:  Lying in bed. NAD.  HEENT: normal  Neck: supple. JVP not elevated.  RIJ TLC  Carotids 2+ bilat; no bruits. No lymphadenopathy or thryomegaly appreciated. Cor: LVAD hum.  Lungs: Clear. Abdomen:  soft, nontender, non-distended. No hepatosplenomegaly. No bruits or masses. Good bowel sounds. Driveline site clean. Anchor in place.  Extremities: no cyanosis, clubbing, rash. Warm no edema  LUE hematoma with nonfunctional graft  Neuro: alert & oriented x 3. No focal deficits. Moves all 4 without problem   Telemetry   NSR 80s, personally reviewed.   Labs   Basic Metabolic Panel: Recent Labs  Lab 08/28/18 1839 08/29/18 0600 08/30/18 0508 08/31/18 0530 09/01/18 0516  NA 135 134* 136 135 136  K 3.4* 3.9 3.7 3.9 3.9  CL 96* 97* 98 97* 98  CO2 24 20* 25 26 25   GLUCOSE 162* 252* 95 115* 212*  BUN 15  24* 33* 39* 21*  CREATININE 5.11* 6.12* 8.38* 10.26* 5.80*  CALCIUM 9.5 8.5* 8.1* 7.8* 8.2*    Liver Function Tests: Recent Labs  Lab 08/28/18 1839  AST 35  ALT 21  ALKPHOS 120  BILITOT 1.2  PROT 8.1  ALBUMIN 3.6   Recent Labs  Lab 08/28/18 1839  LIPASE 30   No results for input(s): AMMONIA in the last 168 hours.  CBC: Recent Labs  Lab 08/28/18 1839 08/29/18 0600 08/30/18 0508 08/31/18 0530 09/01/18 0516  WBC 26.4* 24.8* 14.0* 10.9* 14.1*  NEUTROABS 23.7*  --   --   --   --   HGB 11.2* 10.0* 9.2* 8.2* 9.0*  HCT 35.5* 31.8* 28.7* 26.7* 29.2*  MCV 92.4 94.1 94.1 93.0 93.9  PLT 333 332 289 269 287    INR: Recent Labs  Lab 08/28/18 1839 08/29/18 0600 08/30/18 0508 08/31/18 0530 09/01/18 0516  INR 1.32 1.37 1.93 1.78 2.01    Other results:  EKG:    Imaging   Vas US Duplex Dialysis Access (avf, Avg)  Result Date: 08/30/2018 DIALYSIS ACCESS Reason for Exam: No palpable thrill for AVF/AVG. Access Type: Upper arm loop AVG. History: 08/09/18 - INSERTION OF AV GORE-TEX GRAFT LEFT UPPER ARM. Performing Technologist: Oda Cogan RDMS, RVT  Examination Guidelines: A complete evaluation includes B-mode imaging, spectral Doppler, color Doppler, and power Doppler as needed of  all accessible portions of each vessel. Unilateral testing is considered an integral part of a complete examination. Limited examinations for reoccurring indications may be performed as noted.  Findings:  Two large heterogenous structures along the mid upper arm graft and at the antecubital fossa suggestive of possible hematomas.  +--------------------+----------+-----------------+--------+ AVG                 PSV (cm/s)Flow Vol (mL/min)Describe +--------------------+----------+-----------------+--------+ Arterial anastomosis    0                      OCCLUDED +--------------------+----------+-----------------+--------+ Prox graft              0                      OCCLUDED  +--------------------+----------+-----------------+--------+ Mid graft               0                      OCCLUDED +--------------------+----------+-----------------+--------+ Distal graft            0                      OCCLUDED +--------------------+----------+-----------------+--------+  Summary: Left upper arm AV Graft is occluded.  *See table(s) above for measurements and observations.  Diagnosing physician: Monica Martinez MD Electronically signed by Monica Martinez MD on 08/30/2018 at 2:07:54 PM.   --------------------------------------------------------------------------------   Final     Medications:     Scheduled Medications: . amiodarone  200 mg Oral Daily  . aspirin EC  81 mg Oral Daily  . atorvastatin  40 mg Oral q1800  . calcitRIOL  1 mcg Oral Q M,W,F  . Chlorhexidine Gluconate Cloth  6 each Topical Q0600  . Chlorhexidine Gluconate Cloth  6 each Topical Q0600  . ciprofloxacin  500 mg Oral Q24H  . citalopram  20 mg Oral Daily  . dronabinol  2.5 mg Oral BID AC  . erythromycin  250 mg Oral TID WC  . insulin aspart  0-15 Units Subcutaneous TID WC  . insulin aspart  0-5 Units Subcutaneous QHS  . insulin aspart  5 Units Subcutaneous TID WC  . insulin glargine  10 Units Subcutaneous Daily  . isosorbide-hydrALAZINE  1 tablet Oral 2 times per day on Mon Sat  . metoCLOPramide  10 mg Oral TID WC  . multivitamin  1 tablet Oral QHS  . pantoprazole  40 mg Oral BID  . sevelamer carbonate  4,000 mg Oral TID WC  . warfarin  7.5 mg Oral ONCE-1800  . Warfarin - Pharmacist Dosing Inpatient   Does not apply q1800    Infusions:   PRN Medications: acetaminophen, docusate sodium, hydrALAZINE, LORazepam, LORazepam, ondansetron (ZOFRAN) IV, promethazine, sevelamer carbonate, traMADol  Patient Profile   [name] is a 52 y.o. malewith history of CAD s/p anterior MI in 2010 and NSTEMI in 3/18 with DES to pRCA and mLAD, diabetic, gastroparesis, Chrons disease,  DMIIand ischemic cardiomyopathy, 10/2018s/p Heartmate 3 LVAD(emergent exchange 02/2018 due to outflow graft kink)and ESRD(HD MWF). He has LUE AVR 05/2018.  Admitted 12/24 with recurrent N/V/D and subtherapeutic INR and elevated LDH  Assessment/Plan:    1. Recurrent nauseas/vomitting due to diabetic gastroparesis: Difficult to manage. Now with very frequent readmits. Also had component of diarrhea today but doubt c. Diff. Ab CT essentially unremarkable in ER. He has seen gastroparesis specialist at [facility]. Surprisingly,  gastric emptying study was normal. However, electrogastrogram showed poor gastric accomodation/capacity. Therefore, small frequent meals recommended. Also morning nausea thought to be GERD and Protonix was increased.  - Had watery diarrhea with WBC 26.4k. Stool does not seem like c.diff.  - WBC 26.4 > 24.8 > 14 > 10.9 . 14.1 on Cipro for possible bacterial gastroenteritis. Bcx negative - Stool Cx doesn't appear to have been sent.   - AB CT ok on 12/24 - Continue reglan 5 mg tid/ac, chronic erythromycin, and Marinol.  - Continue Protonix BID. - Continue to use phenergan prn. Works much better than Zofran.  - Make effort to eat small, more frequent meals.  - Not sure there is a good answer to his problem and may be heading toward more palliative approach. Encouraged him to be more active and walk to promote GI motility. Will have CR see.   2.. Elevated LDH and Subtherapeutic INR - INR 2.01. Discussed dosing with PharmD personally. - LDH 427 -> 373 -> 284 -> 259 -> 270 Doubt pump clot. Not dark urine or power spikes.   3. Chronic systolic CHF s/p HM-3 VAD: Ischemic cardiomyopathy. St Jude ICD. NSTEMI in March 2018 with DES to LAD and RCA, complicated by cardiogenic shock, low output requiring milrinone. Echo 8/18 with EF 20-25%, moderate MR. CPX (8/18) with severe functional impairment due to HF. He is s/p HM3 LVAD placement. He had pump thrombosis 6/19 due to [facility]  outflow graft kinking. He had pump exchange for another HM3.RHC showed low filling pressures and good cardiac output 08/09/18   -Recently dry weight increased. This has helped his flows, but not his overall debility.  - Volume status OK. For HD this am.  - Continue Bidil 1 tabTIDon non-HD days.  - VAD interrogated personally. Parameters stable.   [facility] management as above INR 2.01 - Continue ASA.  - He is now listed for heart/kidney at [facility]. [place] 08/09/18 with optimized filling pressures and cardiac output. Normal PA pressure.   4. ESRD: Developed peri-operatively after HM3 placement. He is tolerating HD, currently via catheter. Hematoma post-AV LUE graft placement. Graft now nonfunctional   - MWF HD but on Holiday schedule. Will get HD tomorrow  5. LUE hematoma - Resolving. Assessed by OT and VVS multiple times this admit.   6. CAD: NSTEMI in 3/18. LHC with 99% ulcerated lesion proximal RCA with left to right collaterals, 95% mid LAD stenosis after mid LAD stent. s/p PCI to RCA and LAD on 11/25/16.  - No s/s of ischemia.    - Continue statin.  7. DM:  - HgbA1c well-controlled. No change.   - Cover with SSI while in house.   8. HTN:  - BP relatively stable for non-HD day.  - Continue Bidil on non HD days.  - continue prn hydral for MAP > 100.   9. Anemia: Received 1PURBc 12/9.  - Hgb 11 -> 10 -> 9.2 -> 8.2 -> 9.0.   Will wait and see how he tolerates HD tomorrow to assess timing of discharge.   I reviewed the LVAD parameters from today, and compared the results to the patient's prior recorded data.  No programming changes were made.  The LVAD is functioning within specified parameters.  The patient performs LVAD self-test daily.  LVAD interrogation was negative for any significant power changes, alarms or PI events/speed drops.  LVAD equipment check completed and is in good working order.  Back-up equipment present.   LVAD education done on emergency  procedures and  precautions and reviewed exit site care.   Length of Stay: [place], MD 09/01/2018, 9:57 AM  VAD Team --- VAD ISSUES ONLY--- Pager [phone] (7am - 7am)  Advanced Heart Failure Team  Pager [phone] (M-F; 7a - 4p)  Please contact [facility] for night-coverage after hours (4p -7a ) and weekends on [email or web address]

## 2018-09-01 NOTE — Progress Notes (Signed)
Subjective: Interval History: has complaints stomach was horrible yesterday.  Objective: Vital signs in last 24 hours: Temp:  [97.9 F (36.6 C)-99.1 F (37.3 C)] 98.9 F (37.2 C) (12/28 0342) Pulse Rate:  [59-113] 59 (12/28 0000) Resp:  [11-24] 18 (12/28 0400) BP: (87-160)/(48-103) 87/58 (12/28 0342) SpO2:  [97 %-100 %] 100 % (12/28 0000) Weight:  [78.2 kg-79.8 kg] 78.2 kg (12/28 0232) Weight change: -2.3 kg  Intake/Output from previous day: 12/27 0701 - 12/28 0700 In: 637.6 [P.O.:440; I.V.:197.6] Out: 2769  Intake/Output this shift: No intake/output data recorded.  General appearance: alert, cooperative and no distress Resp: clear to auscultation bilaterally Chest wall: LIJ cath Cardio: cont hum of LVAD GI: liver down 6 cm, LVAD tubes RUQ, pos bs. nontender Extremities: hematoma around avf LUA, 1+ edema  Lab Results: Recent Labs    08/31/18 0530 09/01/18 0516  WBC 10.9* 14.1*  HGB 8.2* 9.0*  HCT 26.7* 29.2*  PLT 269 287   BMET:  Recent Labs    08/31/18 0530 09/01/18 0516  NA 135 136  K 3.9 3.9  CL 97* 98  CO2 26 25  GLUCOSE 115* 212*  BUN 39* 21*  CREATININE 10.26* 5.80*  CALCIUM 7.8* 8.2*   No results for input(s): PTH in the last 72 hours. Iron Studies: No results for input(s): IRON, TIBC, TRANSFERRIN, FERRITIN in the last 72 hours.  Studies/Results: Vas US Duplex Dialysis Access (avf, Avg)  Result Date: 08/30/2018 DIALYSIS ACCESS Reason for Exam: No palpable thrill for AVF/AVG. Access Type: Upper arm loop AVG. History: 08/09/18 - INSERTION OF AV GORE-TEX GRAFT LEFT UPPER ARM. Performing Technologist: Oda Cogan RDMS, RVT  Examination Guidelines: A complete evaluation includes B-mode imaging, spectral Doppler, color Doppler, and power Doppler as needed of all accessible portions of each vessel. Unilateral testing is considered an integral part of a complete examination. Limited examinations for reoccurring indications may be performed as noted.   Findings:  Two large heterogenous structures along the mid upper arm graft and at the antecubital fossa suggestive of possible hematomas.  +--------------------+----------+-----------------+--------+ AVG                 PSV (cm/s)Flow Vol (mL/min)Describe +--------------------+----------+-----------------+--------+ Arterial anastomosis    0                      OCCLUDED +--------------------+----------+-----------------+--------+ Prox graft              0                      OCCLUDED +--------------------+----------+-----------------+--------+ Mid graft               0                      OCCLUDED +--------------------+----------+-----------------+--------+ Distal graft            0                      OCCLUDED +--------------------+----------+-----------------+--------+  Summary: Left upper arm AV Graft is occluded.  *See table(s) above for measurements and observations.  Diagnosing physician: Monica Martinez MD Electronically signed by Monica Martinez MD on 08/30/2018 at 2:07:54 PM.   --------------------------------------------------------------------------------   Final     I have reviewed the patient's current medications.  Assessment/Plan: 1 ESRD HD tomorrow 2 DM controlled 3 Anemia improved with esa, hemoconc 4 HPTH vit D 5 Gastroparesis on going.  Encouraged not to put finger  down throat 6 CM with LVAD anticoag P HD, LVAD REglan, PPI,      LOS: 4 days   Jeneen Rinks Isabeau Mccalla 09/01/2018,7:15 AM

## 2018-09-01 NOTE — Progress Notes (Signed)
[place] for heparin/warfarin Indication: LVAD/Factor 5 [place]  Allergies  Allergen Reactions  . Metformin And Related Diarrhea    Patient Measurements: Height: 5' 7"  (170.2 cm) Weight: 172 lb 6.4 oz (78.2 kg) IBW/kg (Calculated) : 66.1  Vital Signs: Temp: 97.5 F (36.4 C) (12/28 0729) Temp Source: Oral (12/28 0729) BP: 116/89 (12/28 0729) Pulse Rate: 59 (12/28 0000)  Labs: Recent Labs    08/30/18 0508 08/31/18 0530 08/31/18 2030 09/01/18 0516  HGB 9.2* 8.2*  --  9.0*  HCT 28.7* 26.7*  --  29.2*  PLT 289 269  --  287  LABPROT 21.8* 20.4*  --  22.5*  INR 1.93 1.78  --  2.01  HEPARINUNFRC 1.30*  --  0.62 0.73*  CREATININE 8.38* 10.26*  --  5.80*    Estimated Creatinine Clearance: 13.8 mL/min (A) (by C-G formula based on SCr of 5.8 mg/dL (H)).   Medical History: Past Medical History:  Diagnosis Date  . Angina   . ASCVD (arteriosclerotic cardiovascular disease)    , Anterior infarction 2005, LAD diagonal bifurcation intervention 03/2004  . Automatic implantable cardiac defibrillator -St. Jude's       . Benign neoplasm of colon   . CHF (congestive heart failure) ([place])   . Chronic systolic heart failure ([place])   . Coronary artery disease     Widely patent previously placed stents in the left anterior   . Crohn's disease ([place])   . 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 Dialysis patient ([place])   . Dyspnea   . Gastroparesis   . GERD (gastroesophageal reflux disease)   . High cholesterol   . Hyperlipidemia   . Hypersomnolent    Previous diagnosis of narcolepsy  . Hypertension, essential   . Ischemic cardiomyopathy    Ejection fraction 15-20% catheterization 2010  . Type II or unspecified type diabetes mellitus without mention of complication, not stated as uncontrolled   . Unspecified gastritis and gastroduodenitis without mention of hemorrhage     Assessment: 33 yoM with LVAD on warfarin PTA, now  suspected LVAD thrombus and pharmacy consulted to start heparin and continue warfarin. INR supratherapeutic on 12/22 and then on readmission subtherapeutic at 1.32.   INR therapeutic at 2.01 s/p warfarin 7.5 mg. Noted interaction with ciprofloxacin, last dose tomorrow. HL near therapeutic at 0.73 on heparin 1350 units/hr, decreased to 1250 units/hr this AM. Discussed with DB, will DC heparin given therapeutic INR. LDH stable, CBC low but stable but no s/sx bleeding reported by nursing.  PTA warfarin dosing = 36m daily except 7.58mon Mondays  Goal of Therapy:  INR 2-3 Monitor platelets by anticoagulation protocol: Yes   Plan:  - Warfarin 7.5 mg x1 to ensure INR remains therapeutic - Discontinue heparin - Daily INR, LDH, CBC  EmClaiborne BillingsPharmD PGY2 Cardiology Pharmacy Resident Phone ([phone] check AMION for all Pharmacist numbers by unit 09/01/2018 8:43 AM

## 2018-09-02 LAB — GLUCOSE, CAPILLARY
Glucose-Capillary: 152 mg/dL — ABNORMAL HIGH (ref 70–99)
Glucose-Capillary: 212 mg/dL — ABNORMAL HIGH (ref 70–99)
Glucose-Capillary: 169 mg/dL — ABNORMAL HIGH (ref 70–99)
Glucose-Capillary: 178 mg/dL — ABNORMAL HIGH (ref 70–99)

## 2018-09-02 LAB — LACTATE DEHYDROGENASE: LDH: 264 U/L — ABNORMAL HIGH (ref 98–192)

## 2018-09-02 LAB — BASIC METABOLIC PANEL
Anion gap: 11 (ref 5–15)
BUN: 36 mg/dL — ABNORMAL HIGH (ref 6–20)
CHLORIDE: 100 mmol/L (ref 98–111)
CO2: 25 mmol/L (ref 22–32)
Calcium: 8.2 mg/dL — ABNORMAL LOW (ref 8.9–10.3)
Creatinine, Ser: 8.69 mg/dL — ABNORMAL HIGH (ref 0.61–1.24)
GFR calc Af Amer: 7 mL/min — ABNORMAL LOW (ref >60)
GFR calc non Af Amer: 6 mL/min — ABNORMAL LOW (ref >60)
Glucose, Bld: 157 mg/dL — ABNORMAL HIGH (ref 70–99)
Potassium: 4.1 mmol/L (ref 3.5–5.1)
Sodium: 136 mmol/L (ref 135–145)

## 2018-09-02 LAB — CULTURE, BLOOD (ROUTINE X 2)
Culture: NO GROWTH
Culture: NO GROWTH

## 2018-09-02 LAB — CBC
HCT: 29 % — ABNORMAL LOW (ref 39.0–52.0)
Hemoglobin: 8.9 g/dL — ABNORMAL LOW (ref 13.0–17.0)
MCH: 28.6 pg (ref 26.0–34.0)
MCHC: 30.7 g/dL (ref 30.0–36.0)
MCV: 93.2 fL (ref 80.0–100.0)
PLATELETS: 273 10*3/uL (ref 150–400)
RBC: 3.11 MIL/uL — ABNORMAL LOW (ref 4.22–5.81)
RDW: 17.1 % — ABNORMAL HIGH (ref 11.5–15.5)
WBC: 12.7 10*3/uL — ABNORMAL HIGH (ref 4.0–10.5)
nRBC: 0 % (ref 0.0–0.2)

## 2018-09-02 LAB — PROTIME-INR
INR: 2.3
Prothrombin Time: 25 seconds — ABNORMAL HIGH (ref 11.4–15.2)

## 2018-09-02 MED ORDER — ISOSORB DINITRATE-HYDRALAZINE 20-37.5 MG PO TABS
1.0000 | ORAL_TABLET | Freq: Two times a day (BID) | ORAL | Status: AC
  Administered 2018-09-02 (×2): 1 via ORAL
  Filled 2018-09-02: qty 1

## 2018-09-02 MED ORDER — WARFARIN SODIUM 5 MG PO TABS
5.0000 mg | ORAL_TABLET | Freq: Once | ORAL | Status: AC
  Administered 2018-09-02: 5 mg via ORAL
  Filled 2018-09-02: qty 1

## 2018-09-02 NOTE — Progress Notes (Signed)
Advanced Heart Failure VAD Team Note  PCP-Cardiologist: No primary care provider on file.   Subjective:    Feels weak today but otherwise ok. N/V/D has resolved. Was supposed to get HD today with possible d/c after but unable to get HD today and is now on schedule for first thing in am (D/w Dr. Augustin Coupe personally)  No fevers or chills. INR 2.30. No bleeding  LVAD Interrogation HM 3: Speed: 5500 Flow: 4.0 PI: 3.6 Power: 4.0. Rare PI events. VAD interrogated personally. Parameters stable.    Objective:    Vital Signs:   Temp:  [98.3 F (36.8 C)-99.2 F (37.3 C)] 99.2 F (37.3 C) (12/29 0738) Pulse Rate:  [88] 88 (12/28 1616) Resp:  [14-23] 16 (12/29 0738) BP: (70-131)/(54-82) 85/68 (12/29 0738) SpO2:  [95 %-96 %] 96 % (12/28 1616) Weight:  [80.5 kg] 80.5 kg (12/29 0427) Last BM Date: 08/28/18 Mean arterial Pressure 80-90s   Intake/Output:   Intake/Output Summary (Last 24 hours) at 09/02/2018 0857 Last data filed at 09/02/2018 0400 Gross per 24 hour  Intake 256.35 ml  Output 0 ml  Net 256.35 ml     Physical Exam    General:  NAD.  HEENT: normal  Neck: supple. JVP 7 . RIJ TLC  Carotids 2+ bilat; no bruits. No lymphadenopathy or thryomegaly appreciated. Cor: LVAD hum.  Lungs: Clear. Abdomen: obese soft, nontender, non-distended. No hepatosplenomegaly. No bruits or masses. Good bowel sounds. Driveline site clean. Anchor in place.  Extremities: no cyanosis, clubbing, rash. Warm no edema  LUE hematoma with nonfunctional graft Neuro: alert & oriented x 3. No focal deficits. Moves all 4 without problem    Telemetry   NSR 80-90s, personally reviewed.   Labs   Basic Metabolic Panel: Recent Labs  Lab 08/29/18 0600 08/30/18 0508 08/31/18 0530 09/01/18 0516 09/02/18 0453  NA 134* 136 135 136 136  K 3.9 3.7 3.9 3.9 4.1  CL 97* 98 97* 98 100  CO2 20* 25 26 25 25   GLUCOSE 252* 95 115* 212* 157*  BUN 24* 33* 39* 21* 36*  CREATININE 6.12* 8.38* 10.26* 5.80* 8.69*    CALCIUM 8.5* 8.1* 7.8* 8.2* 8.2*    Liver Function Tests: Recent Labs  Lab 08/28/18 1839  AST 35  ALT 21  ALKPHOS 120  BILITOT 1.2  PROT 8.1  ALBUMIN 3.6   Recent Labs  Lab 08/28/18 1839  LIPASE 30   No results for input(s): AMMONIA in the last 168 hours.  CBC: Recent Labs  Lab 08/28/18 1839 08/29/18 0600 08/30/18 0508 08/31/18 0530 09/01/18 0516 09/02/18 0453  WBC 26.4* 24.8* 14.0* 10.9* 14.1* 12.7*  NEUTROABS 23.7*  --   --   --   --   --   HGB 11.2* 10.0* 9.2* 8.2* 9.0* 8.9*  HCT 35.5* 31.8* 28.7* 26.7* 29.2* 29.0*  MCV 92.4 94.1 94.1 93.0 93.9 93.2  PLT 333 332 289 269 287 273    INR: Recent Labs  Lab 08/29/18 0600 08/30/18 0508 08/31/18 0530 09/01/18 0516 09/02/18 0453  INR 1.37 1.93 1.78 2.01 2.30    Other results:  EKG:    Imaging   No results found.  Medications:     Scheduled Medications: . amiodarone  200 mg Oral Daily  . aspirin EC  81 mg Oral Daily  . atorvastatin  40 mg Oral q1800  . calcitRIOL  1 mcg Oral Q M,W,F  . Chlorhexidine Gluconate Cloth  6 each Topical Q0600  . Chlorhexidine Gluconate Cloth  6  each Topical [id number]  . ciprofloxacin  500 mg Oral Q24H  . citalopram  20 mg Oral Daily  . dronabinol  2.5 mg Oral BID AC  . erythromycin  250 mg Oral TID WC  . insulin aspart  0-15 Units Subcutaneous TID WC  . insulin aspart  0-5 Units Subcutaneous QHS  . insulin aspart  5 Units Subcutaneous TID WC  . insulin glargine  10 Units Subcutaneous Daily  . isosorbide-hydrALAZINE  1 tablet Oral 2 times per day on Mon Sat  . metoCLOPramide  10 mg Oral TID WC  . multivitamin  1 tablet Oral QHS  . pantoprazole  40 mg Oral BID  . sevelamer carbonate  4,000 mg Oral TID WC  . Warfarin - Pharmacist Dosing Inpatient   Does not apply q1800    Infusions:   PRN Medications: acetaminophen, docusate sodium, hydrALAZINE, LORazepam, LORazepam, ondansetron (ZOFRAN) IV, promethazine, sevelamer carbonate, traMADol  Patient Profile    [name] is a 52 y.o. malewith history of CAD s/p anterior MI in 2010 and NSTEMI in 3/18 with DES to pRCA and mLAD, diabetic, gastroparesis, Chrons disease, DMIIand ischemic cardiomyopathy, 10/2018s/p Heartmate 3 LVAD(emergent exchange 02/2018 due to outflow graft kink)and ESRD(HD MWF). He has LUE AVR 05/2018.  Admitted 12/24 with recurrent N/V/D and subtherapeutic INR and elevated LDH  Assessment/Plan:    1. Recurrent nauseas/vomitting due to diabetic gastroparesis: Difficult to manage. Now with very frequent readmits. Also had component of diarrhea today but doubt c. Diff. Ab CT essentially unremarkable in ER. He has seen gastroparesis specialist at [facility]. Surprisingly, gastric emptying study was normal. However, electrogastrogram showed poor gastric accomodation/capacity. Therefore, small frequent meals recommended. Also morning nausea thought to be GERD and Protonix was increased.  - Had watery diarrhea with WBC 26.4k. Stool does not seem like c.diff.  - WBC 26.4 > 24.8 > 14 > 10.9-> 14.1 -> 12.7 Has completed 5 days of Cipro for possible bacterial gastroenteritis. Bcx negative - Stool Cx doesn't appear to have been sent.   - AB CT ok on 12/24 - Continue reglan 5 mg tid/ac, chronic erythromycin, and Marinol.  - Continue Protonix BID. - Continue to use phenergan prn. Works much better than Zofran.  - Make effort to eat small, more frequent meals.  - Not sure there is a good answer to his problem and may be heading toward more palliative approach. Encouraged him to be more active and walk to promote GI motility. CR ordered   2.. Elevated LDH and Subtherapeutic INR - INR 2.30 today. Discussed dosing with PharmD personally. - LDH improved: 427 -> 373 -> 284 -> 259 -> 270 D-> 264  3. Chronic systolic CHF s/p HM-3 VAD: Ischemic cardiomyopathy. St Jude ICD. NSTEMI in March 2018 with DES to LAD and RCA, complicated by cardiogenic shock, low output requiring  milrinone. Echo 8/18 with EF 20-25%, moderate MR. CPX (8/18) with severe functional impairment due to HF. He is s/p HM3 LVAD placement. He had pump thrombosis 6/19 due to [facility] outflow graft kinking. He had pump exchange for another HM3.RHC showed low filling pressures and good cardiac output 08/09/18   -Recently dry weight increased. This has helped his flows, but not his overall debility.  - Volume status OK. - Continue Bidil 1 tabTIDon non-HD days. MAPs 80s - VAD interrogated personally. Parameters stable. [facility] management as above INR 2.30 - Continue ASA.  - He is now listed for heart/kidney at [facility] but suspect severity of gastroparesis is prohibitive  for OHTx. [place] 08/09/18 with optimized filling pressures and cardiac output. Normal PA pressure.   4. ESRD: Developed peri-operatively after HM3 placement. He is tolerating HD, currently via catheter. Hematoma post-AV LUE graft placement. Graft now nonfunctional   - Dialysis units on Holiday schedule. Will get HD tomorrow then Tuesday as outpatient if ready for d/c  5. LUE hematoma - Resolving. Assessed by OT and VVS multiple times this admit.   6. CAD: NSTEMI in 3/18. LHC with 99% ulcerated lesion proximal RCA with left to right collaterals, 95% mid LAD stenosis after mid LAD stent. s/p PCI to RCA and LAD on 11/25/16.  - No s/s of ischemia.    - Continue statin.  7. DM:  - HgbA1c well-controlled. No change.   - Covering with SSI while in house.  - CBGs ok   8. HTN:  - BP relatively stable for non-HD day.  - Continue Bidil on non HD days.  - continue prn hydral for MAP > 100.   9. Anemia: Received 1PURBc 12/9.  - Hgb 11 -> 10 -> 9.2 -> 8.2 -> 9.0. -> 8.9 .   I reviewed the LVAD parameters from today, and compared the results to the patient's prior recorded data.  No programming changes were made.  The LVAD is functioning within specified parameters.  The patient performs LVAD self-test daily.  LVAD interrogation was  negative for any significant power changes, alarms or PI events/speed drops.  LVAD equipment check completed and is in good working order.  Back-up equipment present.   LVAD education done on emergency procedures and precautions and reviewed exit site care.   Length of Stay: [place], MD 09/02/2018, 8:57 AM  VAD Team --- VAD ISSUES ONLY--- Pager [phone] (7am - 7am)  Advanced Heart Failure Team  Pager [phone] (M-F; 7a - 4p)  Please contact [facility] for night-coverage after hours (4p -7a ) and weekends on [email or web address]

## 2018-09-02 NOTE — Progress Notes (Signed)
[facility] Progress Note   [facility] MWF 4hr 52mn 2/2.5 EDW 81kg LIJ TC Mircera 1515m q2weeks last 08/20/2018 Calcitriol 1 mct TIW  Assessment/ Plan:   1 ESRD HD should have been today but unit being disinfected and earliest pt could go on is after 4pm. Likely will have to schedule for first thing Mon AM. Outpt schedule this holiday week is Sun, Tues, Fri for this gentleman. - new EDW (as low as 78.2 but may be able to decr EDW to 79-80kg based on weights in the hospital) 2 DM controlled 3 Anemia improved with esa, hemoconc 4 HPTH vit D 5 Gastroparesis better today 6 CM with LVAD anticoag P HD, LVAD REglan, PPI  Subjective:   Denies worsening dyspnea. Denies f/c/n today.   Objective:   BP (!) 85/68 (BP Location: Right Arm)   Pulse 88   Temp 99.2 F (37.3 C) (Oral)   Resp 16   Ht 5' 7"  (1.702 m)   Wt 80.5 kg   SpO2 96%   BMI 27.80 kg/m   Intake/Output Summary (Last 24 hours) at 09/02/2018 088527ast data filed at 09/02/2018 0400 Gross per 24 hour  Intake 256.35 ml  Output 0 ml  Net 256.35 ml   Weight change: 0.7 kg  Physical Exam: General appearance: alert, cooperative and no distress Resp: clear to auscultation bilaterally Chest wall: LIJ cath Cardio: cont hum of LVAD GI: liver down 6 cm, LVAD tubes RUQ, pos bs. nontender Extremities: hematoma around avf LUA, 1+ edema  Imaging: No results found.  Labs: BMET Recent Labs  Lab 08/27/18 0711 08/28/18 1839 08/29/18 0600 08/30/18 0508 08/31/18 0530 09/01/18 0516 09/02/18 0453  NA 132* 135 134* 136 135 136 136  K 4.2 3.4* 3.9 3.7 3.9 3.9 4.1  CL 98 96* 97* 98 97* 98 100  CO2 25 24 20* 25 26 25 25   GLUCOSE 244* 162* 252* 95 115* 212* 157*  BUN 18 15 24* 33* 39* 21* 36*  CREATININE 6.07* 5.11* 6.12* 8.38* 10.26* 5.80* 8.69*  CALCIUM 7.8* 9.5 8.5* 8.1* 7.8* 8.2* 8.2*   CBC Recent Labs  Lab 08/28/18 1839  08/30/18 0508 08/31/18 0530 09/01/18 0516 09/02/18 0453  WBC 26.4*   < > 14.0*  10.9* 14.1* 12.7*  NEUTROABS 23.7*  --   --   --   --   --   HGB 11.2*   < > 9.2* 8.2* 9.0* 8.9*  HCT 35.5*   < > 28.7* 26.7* 29.2* 29.0*  MCV 92.4   < > 94.1 93.0 93.9 93.2  PLT 333   < > 289 269 287 273   < > = values in this interval not displayed.    Medications:    . amiodarone  200 mg Oral Daily  . aspirin EC  81 mg Oral Daily  . atorvastatin  40 mg Oral q1800  . calcitRIOL  1 mcg Oral Q M,W,F  . Chlorhexidine Gluconate Cloth  6 each Topical Q0600  . Chlorhexidine Gluconate Cloth  6 each Topical Q0600  . ciprofloxacin  500 mg Oral Q24H  . citalopram  20 mg Oral Daily  . dronabinol  2.5 mg Oral BID AC  . erythromycin  250 mg Oral TID WC  . insulin aspart  0-15 Units Subcutaneous TID WC  . insulin aspart  0-5 Units Subcutaneous QHS  . insulin aspart  5 Units Subcutaneous TID WC  . insulin glargine  10 Units Subcutaneous Daily  . isosorbide-hydrALAZINE  1 tablet Oral  2 times per day on Mon Sat  . metoCLOPramide  10 mg Oral TID WC  . multivitamin  1 tablet Oral QHS  . pantoprazole  40 mg Oral BID  . sevelamer carbonate  4,000 mg Oral TID WC  . Warfarin - Pharmacist Dosing Inpatient   Does not apply q1800      Otelia Santee, MD 09/02/2018, 8:33 AM

## 2018-09-02 NOTE — Progress Notes (Signed)
[place] for warfarin Indication: LVAD/Factor 5 Leiden  Allergies  Allergen Reactions  . Metformin And Related Diarrhea    Patient Measurements: Height: 5' 7"  (170.2 cm) Weight: 177 lb 7.5 oz (80.5 kg) IBW/kg (Calculated) : 66.1  Vital Signs: Temp: 99 F (37.2 C) (12/29 1146) Temp Source: Oral (12/29 1146) BP: 98/85 (12/29 1146)  Labs: Recent Labs    08/31/18 0530 08/31/18 2030 09/01/18 0516 09/02/18 0453  HGB 8.2*  --  9.0* 8.9*  HCT 26.7*  --  29.2* 29.0*  PLT 269  --  287 273  LABPROT 20.4*  --  22.5* 25.0*  INR 1.78  --  2.01 2.30  HEPARINUNFRC  --  0.62 0.73*  --   CREATININE 10.26*  --  5.80* 8.69*    Estimated Creatinine Clearance: 10 mL/min (A) (by C-G formula based on SCr of 8.69 mg/dL (H)).   Medical History: Past Medical History:  Diagnosis Date  . Angina   . ASCVD (arteriosclerotic cardiovascular disease)    , Anterior infarction 2005, LAD diagonal bifurcation intervention 03/2004  . Automatic implantable cardiac defibrillator -St. Jude's       . Benign neoplasm of colon   . CHF (congestive heart failure) ([place])   . Chronic systolic heart failure ([place])   . Coronary artery disease     Widely patent previously placed stents in the left anterior   . Crohn's disease ([place])   . 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 Dialysis patient ([place])   . Dyspnea   . Gastroparesis   . GERD (gastroesophageal reflux disease)   . High cholesterol   . Hyperlipidemia   . Hypersomnolent    Previous diagnosis of narcolepsy  . Hypertension, essential   . Ischemic cardiomyopathy    Ejection fraction 15-20% catheterization 2010  . Type II or unspecified type diabetes mellitus without mention of complication, not stated as uncontrolled   . Unspecified gastritis and gastroduodenitis without mention of hemorrhage     Assessment: 38 yoM with LVAD on warfarin PTA, now suspected LVAD thrombus and pharmacy  consulted to start heparin and continue warfarin. INR supratherapeutic on 12/22 and then on readmission subtherapeutic at 1.32. Heparin discontinued 12/28.   INR therapeutic at 2.30 s/p warfarin 7.5 mgx2. Completed course of ciprofloxacin. LDH stable, CBC low but stable but no s/sx bleeding reported by nursing.  PTA warfarin dosing = 6m daily except 7.592mon Mondays  Goal of Therapy:  INR 2-3 Monitor platelets by anticoagulation protocol: Yes   Plan:  - Warfarin 5 mg x1  - Daily INR, LDH, CBC  EmClaiborne BillingsPharmD PGY2 Cardiology Pharmacy Resident Phone ([phone] check AMION for all Pharmacist numbers by unit 09/02/2018 1:00 PM

## 2018-09-03 LAB — CBC
HCT: 24.6 % — ABNORMAL LOW (ref 39.0–52.0)
Hemoglobin: 7.7 g/dL — ABNORMAL LOW (ref 13.0–17.0)
MCH: 28.7 pg (ref 26.0–34.0)
MCHC: 31.3 g/dL (ref 30.0–36.0)
MCV: 91.8 fL (ref 80.0–100.0)
Platelets: 264 10*3/uL (ref 150–400)
RBC: 2.68 MIL/uL — AB (ref 4.22–5.81)
RDW: 16.7 % — ABNORMAL HIGH (ref 11.5–15.5)
WBC: 13.1 10*3/uL — ABNORMAL HIGH (ref 4.0–10.5)
nRBC: 0 % (ref 0.0–0.2)

## 2018-09-03 LAB — BASIC METABOLIC PANEL
ANION GAP: 11 (ref 5–15)
BUN: 60 mg/dL — ABNORMAL HIGH (ref 6–20)
CALCIUM: 7.8 mg/dL — AB (ref 8.9–10.3)
CO2: 22 mmol/L (ref 22–32)
Chloride: 98 mmol/L (ref 98–111)
Creatinine, Ser: 11.36 mg/dL — ABNORMAL HIGH (ref 0.61–1.24)
GFR calc non Af Amer: 5 mL/min — ABNORMAL LOW (ref >60)
GFR, EST AFRICAN AMERICAN: 5 mL/min — AB (ref >60)
Glucose, Bld: 171 mg/dL — ABNORMAL HIGH (ref 70–99)
Potassium: 4.1 mmol/L (ref 3.5–5.1)
Sodium: 131 mmol/L — ABNORMAL LOW (ref 135–145)

## 2018-09-03 LAB — GLUCOSE, CAPILLARY: Glucose-Capillary: 169 mg/dL — ABNORMAL HIGH (ref 70–99)

## 2018-09-03 LAB — PROTIME-INR
INR: 2.24
Prothrombin Time: 24.5 seconds — ABNORMAL HIGH (ref 11.4–15.2)

## 2018-09-03 LAB — LACTATE DEHYDROGENASE: LDH: 237 U/L — ABNORMAL HIGH (ref 98–192)

## 2018-09-03 LAB — PREPARE RBC (CROSSMATCH)

## 2018-09-03 MED ORDER — DARBEPOETIN ALFA 200 MCG/0.4ML IJ SOSY
PREFILLED_SYRINGE | INTRAMUSCULAR | Status: AC
  Administered 2018-09-03: 200 ug via INTRAVENOUS
  Filled 2018-09-03: qty 0.4

## 2018-09-03 MED ORDER — DARBEPOETIN ALFA 200 MCG/0.4ML IJ SOSY
200.0000 ug | PREFILLED_SYRINGE | Freq: Once | INTRAMUSCULAR | Status: AC
  Administered 2018-09-03: 200 ug via INTRAVENOUS

## 2018-09-03 MED ORDER — WARFARIN SODIUM 5 MG PO TABS
5.0000 mg | ORAL_TABLET | ORAL | Status: DC
  Filled 2018-09-03: qty 1

## 2018-09-03 MED ORDER — WARFARIN SODIUM 5 MG PO TABS: ORAL_TABLET | ORAL | 6 refills | Status: DC

## 2018-09-03 MED ORDER — HEPARIN SODIUM (PORCINE) 1000 UNIT/ML IJ SOLN
INTRAMUSCULAR | Status: AC
  Administered 2018-09-03: 3400 [IU]
  Filled 2018-09-03: qty 4

## 2018-09-03 MED ORDER — WARFARIN SODIUM 7.5 MG PO TABS
7.5000 mg | ORAL_TABLET | ORAL | Status: DC
  Filled 2018-09-03: qty 1

## 2018-09-03 MED ORDER — SODIUM CHLORIDE 0.9% IV SOLUTION: Freq: Once | INTRAVENOUS | Status: DC

## 2018-09-03 NOTE — Progress Notes (Signed)
Pt dressed for d/c after return from HD. Will not see. Yves Dill CES, ACSM 1:43 PM 09/03/2018

## 2018-09-03 NOTE — Progress Notes (Addendum)
Advanced Heart Failure VAD Team Note  PCP-Cardiologist: No primary care provider on file.   Subjective:    Feeling ok. Denies nausea. No overt signs of bleeding.   LVAD Interrogation HM 3: Speed: 5500 Flow: 4.1 PI: 3.5  Power: 4. Rare PI events.    Objective:    Vital Signs:   Temp:  [98.4 F (36.9 C)-99 F (37.2 C)] 98.4 F (36.9 C) (12/30 0700) Pulse Rate:  [76-98] 84 (12/30 0800) Resp:  [14-21] 18 (12/30 0715) BP: (90-125)/(50-85) 110/70 (12/30 0800) SpO2:  [99 %] 99 % (12/30 0700) Weight:  [83.2 kg] 83.2 kg (12/30 0700) Last BM Date: 08/26/18 Mean arterial Pressure 70-80s    Intake/Output:   Intake/Output Summary (Last 24 hours) at 09/03/2018 0823 Last data filed at 09/03/2018 0000 Gross per 24 hour  Intake 1080 ml  Output -  Net 1080 ml     Physical Exam   Physical Exam: GENERAL: He was seen in dialysis. NAD.  HEENT: normal  NECK: RIJ HD cath.  Supple, JVP 7-8  .  2+ bilaterally, no bruits.  No lymphadenopathy or thyromegaly appreciated.   CARDIAC:  Mechanical heart sounds with LVAD hum present.  LUNGS:  Clear to auscultation bilaterally.  ABDOMEN:  Soft, round, nontender, positive bowel sounds x4.     LVAD exit site: well-healed and incorporated.  Dressing dry and intact.  No erythema or drainage.  Stabilization device present and accurately applied.  Driveline dressing is being changed daily per sterile technique. EXTREMITIES:  Warm and dry, no cyanosis, clubbing, rash or edema . LUE around graft remains indurated but much improved.  NEUROLOGIC:  Alert and oriented x 4.  Gait steady.  No aphasia.  No dysarthria.  Affect pleasant.      Telemetry  NS 80-90s. Personally reviewed.   Labs   Basic Metabolic Panel: Recent Labs  Lab 08/30/18 0508 08/31/18 0530 09/01/18 0516 09/02/18 0453 09/03/18 0500  NA 136 135 136 136 131*  K 3.7 3.9 3.9 4.1 4.1  CL 98 97* 98 100 98  CO2 25 26 25 25 22   GLUCOSE 95 115* 212* 157* 171*  BUN 33* 39* 21* 36* 60*    CREATININE 8.38* 10.26* 5.80* 8.69* 11.36*  CALCIUM 8.1* 7.8* 8.2* 8.2* 7.8*    Liver Function Tests: Recent Labs  Lab 08/28/18 1839  AST 35  ALT 21  ALKPHOS 120  BILITOT 1.2  PROT 8.1  ALBUMIN 3.6   Recent Labs  Lab 08/28/18 1839  LIPASE 30   No results for input(s): AMMONIA in the last 168 hours.  CBC: Recent Labs  Lab 08/28/18 1839  08/30/18 0508 08/31/18 0530 09/01/18 0516 09/02/18 0453 09/03/18 0500  WBC 26.4*   < > 14.0* 10.9* 14.1* 12.7* 13.1*  NEUTROABS 23.7*  --   --   --   --   --   --   HGB 11.2*   < > 9.2* 8.2* 9.0* 8.9* 7.7*  HCT 35.5*   < > 28.7* 26.7* 29.2* 29.0* 24.6*  MCV 92.4   < > 94.1 93.0 93.9 93.2 91.8  PLT 333   < > 289 269 287 273 264   < > = values in this interval not displayed.    INR: Recent Labs  Lab 08/30/18 0508 08/31/18 0530 09/01/18 0516 09/02/18 0453 09/03/18 0500  INR 1.93 1.78 2.01 2.30 2.24    Other results:  EKG:    Imaging   No results found.  Medications:     Scheduled  Medications: . sodium chloride   Intravenous Once  . amiodarone  200 mg Oral Daily  . aspirin EC  81 mg Oral Daily  . atorvastatin  40 mg Oral q1800  . calcitRIOL  1 mcg Oral Q M,W,F  . Chlorhexidine Gluconate Cloth  6 each Topical Q0600  . Chlorhexidine Gluconate Cloth  6 each Topical Q0600  . citalopram  20 mg Oral Daily  . darbepoetin (ARANESP) injection - DIALYSIS  200 mcg Intravenous Once  . dronabinol  2.5 mg Oral BID AC  . erythromycin  250 mg Oral TID WC  . insulin aspart  0-15 Units Subcutaneous TID WC  . insulin aspart  0-5 Units Subcutaneous QHS  . insulin aspart  5 Units Subcutaneous TID WC  . insulin glargine  10 Units Subcutaneous Daily  . metoCLOPramide  10 mg Oral TID WC  . multivitamin  1 tablet Oral QHS  . pantoprazole  40 mg Oral BID  . sevelamer carbonate  4,000 mg Oral TID WC  . Warfarin - Pharmacist Dosing Inpatient   Does not apply q1800    Infusions:   PRN Medications: acetaminophen, docusate  sodium, hydrALAZINE, LORazepam, LORazepam, ondansetron (ZOFRAN) IV, promethazine, sevelamer carbonate, traMADol  Patient Profile   Chevis E Mcnulty is a 52 y.o. malewith history of CAD s/p anterior MI in 2010 and NSTEMI in 3/18 with DES to pRCA and mLAD, diabetic, gastroparesis, Chrons disease, DMIIand ischemic cardiomyopathy, 10/2018s/p Heartmate 3 LVAD(emergent exchange 02/2018 due to outflow graft kink)and ESRD(HD MWF). He has LUE AVR 05/2018.  Admitted 12/24 with recurrent N/V/D and subtherapeutic INR and elevated LDH  Assessment/Plan:    1. Recurrent nauseas/vomitting due to diabetic gastroparesis: Difficult to manage. Now with very frequent readmits. Also had component of diarrhea today but doubt c. Diff. Ab CT essentially unremarkable in ER. He has seen gastroparesis specialist at [facility]. Surprisingly, gastric emptying study was normal. However, electrogastrogram showed poor gastric accomodation/capacity. Therefore, small frequent meals recommended. Also morning nausea thought to be GERD and Protonix was increased.  - Had watery diarrhea with WBC 26.4k. Stool does not seem like c.diff.  - WBC 26.4 > 24.8 > 14 > 10.9-> 14.1 -> 12.7->13  Has completed 5 days of Cipro for possible bacterial gastroenteritis. Bcx negative - Stool Cx doesn't appear to have been sent.   - AB CT ok on 12/24 - Continue reglan 5 mg tid/ac, chronic erythromycin, and Marinol.  - Continue Protonix BID. - Continue to use phenergan prn. Works much better than Zofran.  - Make effort to eat small, more frequent meals.  - Not sure there is a good answer to his problem and may be heading toward more palliative approach. Encouraged him to be more active and walk to promote GI motility. CR ordered   2.. Elevated LDH and Subtherapeutic INR - INR 2.24.  Discussed dosing with PharmD personally. - LDH improved: 427 -> 373 -> 284 -> 259 -> 270 D-> 264->237   3. Chronic systolic CHF s/p HM-3 VAD: Ischemic  cardiomyopathy. St Jude ICD. NSTEMI in March 2018 with DES to LAD and RCA, complicated by cardiogenic shock, low output requiring milrinone. Echo 8/18 with EF 20-25%, moderate MR. CPX (8/18) with severe functional impairment due to HF. He is s/p HM3 LVAD placement. He had pump thrombosis 6/19 due to [facility] outflow graft kinking. He had pump exchange for another HM3.RHC showed low filling pressures and good cardiac output 08/09/18   -Recently dry weight increased. This has helped his flows, but  not his overall debility.  -Volume managed per HD. . - Continue Bidil 1 tabTIDon non-HD days. Maps 70-80s  - VAD interrogated personally. Parameters stable. [facility] management as above INR 2.30 - Continue ASA.  - He is now listed for heart/kidney at [facility] but suspect severity of gastroparesis is prohibitive for OHTx. [place] 08/09/18 with optimized filling pressures and cardiac output. Normal PA pressure.   4. ESRD: Developed peri-operatively after HM3 placement. He is tolerating HD, currently via catheter. Hematoma post-AV LUE graft placement. Graft now nonfunctional   - Dialysis units on Holiday schedule.  - HD today and outpatient tomorrow.   5. LUE hematoma - Resolving. Assessed by OT and VVS multiple times this admit.   6. CAD: NSTEMI in 3/18. LHC with 99% ulcerated lesion proximal RCA with left to right collaterals, 95% mid LAD stenosis after mid LAD stent. s/p PCI to RCA and LAD on 11/25/16.  - No s/s of ischemia.    - Continue statin.  7. DM:  - HgbA1c well-controlled. No change.   - Covering with SSI while in house.  - CBGs ok   8. HTN:  - Stable today.   - Continue Bidil on non HD days.  - continue prn hydral for MAP > 100.   9. Anemia: Received 1PURBc 12/9.  - Hgb 11 -> 10 -> 9.2 -> 8.2 -> 9.0. -> 8.9-> 7.7.  - No overt signs of bleeding. Give 1UPRBC today.   Home today.  I reviewed the LVAD parameters from today, and compared the results to the patient's prior recorded  data.  No programming changes were made.  The LVAD is functioning within specified parameters.  The patient performs LVAD self-test daily.  LVAD interrogation was negative for any significant power changes, alarms or PI events/speed drops.  LVAD equipment check completed and is in good working order.  Back-up equipment present.   LVAD education done on emergency procedures and precautions and reviewed exit site care.   Length of Stay: 6  Darrick Grinder, NP 09/03/2018, 8:23 AM  VAD Team --- VAD ISSUES ONLY--- Pager [phone] (7am - 7am)  Advanced Heart Failure Team  Pager [phone] (M-F; 7a - 4p)  Please contact [facility] for night-coverage after hours (4p -7a ) and weekends on [email or web address]  Patient seen with NP, agree with the above note.   No complaints today.  No further nausea/vomiting and eating his meals.  Hgb is lower but no overt bleeding.  He will get 1 unit PRBCs in HD today.   LDH down to 237.   I think he can go home today.  He will have HD again tomorrow on holiday schedule.  Hopefully, outpatient dialysis will allow higher dry weight.  He does very well with dialysis in the hospital but is constantly sent to the ER from outpatient dialysis, suggesting that the outpatient dialysis prescription should mirror the inpatient precription.   Kaheem Halleck 09/03/2018 8:43 AM

## 2018-09-03 NOTE — Progress Notes (Signed)
VAST RN phoned unit to speak with unit RN regarding order for removal of pt's central line. RN answering phone stated this pt was just discharged. VAST RN asked that unit RN be asked to remove central line in charting.

## 2018-09-03 NOTE — Progress Notes (Signed)
[place] for warfarin Indication: LVAD/Factor 5 Leiden  Allergies  Allergen Reactions  . Metformin And Related Diarrhea    Patient Measurements: Height: 5' 7"  (170.2 cm) Weight: 183 lb 6.8 oz (83.2 kg) IBW/kg (Calculated) : 66.1  Vital Signs: Temp: 98.3 F (36.8 C) (12/30 0917) Temp Source: Oral (12/30 0917) BP: 102/60 (12/30 0917) Pulse Rate: 93 (12/30 0917)  Labs: Recent Labs    08/31/18 2030  09/01/18 0516 09/02/18 0453 09/03/18 0500  HGB  --    < > 9.0* 8.9* 7.7*  HCT  --   --  29.2* 29.0* 24.6*  PLT  --   --  287 273 264  LABPROT  --   --  22.5* 25.0* 24.5*  INR  --   --  2.01 2.30 2.24  HEPARINUNFRC 0.62  --  0.73*  --   --   CREATININE  --   --  5.80* 8.69* 11.36*   < > = values in this interval not displayed.    Estimated Creatinine Clearance: 7.8 mL/min (A) (by C-G formula based on SCr of 11.36 mg/dL (H)).   Medical History: Past Medical History:  Diagnosis Date  . Angina   . ASCVD (arteriosclerotic cardiovascular disease)    , Anterior infarction 2005, LAD diagonal bifurcation intervention 03/2004  . Automatic implantable cardiac defibrillator -St. Jude's       . Benign neoplasm of colon   . CHF (congestive heart failure) ([place])   . Chronic systolic heart failure ([place])   . Coronary artery disease     Widely patent previously placed stents in the left anterior   . Crohn's disease ([place])   . 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 Dialysis patient ([place])   . Dyspnea   . Gastroparesis   . GERD (gastroesophageal reflux disease)   . High cholesterol   . Hyperlipidemia   . Hypersomnolent    Previous diagnosis of narcolepsy  . Hypertension, essential   . Ischemic cardiomyopathy    Ejection fraction 15-20% catheterization 2010  . Type II or unspecified type diabetes mellitus without mention of complication, not stated as uncontrolled   . 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 INR supratherapeutic on 12/22 and then on readmission subtherapeutic at 1.32. Heparin discontinued 12/28.   INR therapeutic at 2.24 s/p course of ciprofloxacin. LDH stable, Hgb down to 7.7 today, getting 1 unit PRBCs in hemodialysis.  PTA warfarin dosing = 64m daily except 7.5 mg on Mondays  Goal of Therapy:  INR 2-3 Monitor platelets by anticoagulation protocol: Yes   Plan:  - Warfarin 7.5 mg x1.  At discharge, planning 5 mg daily except 7.5 mg on Mon and Thurs - INR recheck as outpatient on Thursday - Daily INR, LDH, CBC  JNevada Crane PRoylene Reason BSeton Medical CenterClinical Pharmacist Phone ([phone] 09/03/2018 9:38 AM

## 2018-09-03 NOTE — Progress Notes (Signed)
[facility] Progress Note   [facility] MWF 4hr 48mn 2/2.5 EDW 81kg LIJ TC Mircera 1535m q2weeks last 08/20/2018 Calcitriol 1 mct TIW  Assessment/ Plan:   1 ESRD HD should have been today but unit being disinfected and earliest pt could go on is after 4pm. Likely will have to schedule for first thing Mon AM. Outpt schedule this holiday week is Sun, Tues, Fri for this gentleman.  - new EDW (as low as 78.2 but may be able to decr EDW to 79-80kg based on weights in the hospital) Seen on HD @ 740AM 104/60 2/2.5 Dosed Mircera 200 + 1U PRBC UF 1.2L net  Will limit UF given he's going for HD as outpt Tues + limit the post HD gastroparesis  2 DM controlled 3 Anemia improved with esa, hemoconc 4 HPTH vit D 5 Gastroparesis better today 6 CM with LVAD anticoag P HD, LVAD REglan, PPI   Subjective:   No complaints.  Denies f/c/n/v/dyspnea.   Objective:   BP 104/60   Pulse 87   Temp 98.4 F (36.9 C) (Oral)   Resp 18   Ht 5' 7"  (1.702 m)   Wt 83.2 kg   SpO2 99%   BMI 28.73 kg/m   Intake/Output Summary (Last 24 hours) at 09/03/2018 [id number] data filed at 09/03/2018 0000 Gross per 24 hour  Intake 1320 ml  Output -  Net 1320 ml   Weight change: 2.7 kg  Physical Exam: General appearance:alert, cooperative and no distress Resp:clear to auscultation bilaterally Chest wall:LIJ cath Cardio:cont hum of LVAD GITU:UEKCMown 6 cm, LVAD tubes RUQ, pos bs. nontender Extremities:hematoma around avf LUA, 1+ edema  Imaging: No results found.  Labs: BMET Recent Labs  Lab 08/28/18 1839 08/29/18 0600 08/30/18 0508 08/31/18 0530 09/01/18 0516 09/02/18 0453 09/03/18 0500  NA 135 134* 136 135 136 136 131*  K 3.4* 3.9 3.7 3.9 3.9 4.1 4.1  CL 96* 97* 98 97* 98 100 98  CO2 24 20* 25 26 25 25 22   GLUCOSE 162* 252* 95 115* 212* 157* 171*  BUN 15 24* 33* 39* 21* 36* 60*  CREATININE 5.11* 6.12* 8.38* 10.26* 5.80* 8.69* 11.36*  CALCIUM 9.5 8.5* 8.1* 7.8* 8.2* 8.2*  7.8*   CBC Recent Labs  Lab 08/28/18 1839  08/31/18 0530 09/01/18 0516 09/02/18 0453 09/03/18 0500  WBC 26.4*   < > 10.9* 14.1* 12.7* 13.1*  NEUTROABS 23.7*  --   --   --   --   --   HGB 11.2*   < > 8.2* 9.0* 8.9* 7.7*  HCT 35.5*   < > 26.7* 29.2* 29.0* 24.6*  MCV 92.4   < > 93.0 93.9 93.2 91.8  PLT 333   < > 269 287 273 264   < > = values in this interval not displayed.    Medications:    . sodium chloride   Intravenous Once  . amiodarone  200 mg Oral Daily  . aspirin EC  81 mg Oral Daily  . atorvastatin  40 mg Oral q1800  . calcitRIOL  1 mcg Oral Q M,W,F  . Chlorhexidine Gluconate Cloth  6 each Topical Q0600  . Chlorhexidine Gluconate Cloth  6 each Topical Q0600  . citalopram  20 mg Oral Daily  . dronabinol  2.5 mg Oral BID AC  . erythromycin  250 mg Oral TID WC  . insulin aspart  0-15 Units Subcutaneous TID WC  . insulin aspart  0-5 Units Subcutaneous QHS  .  insulin aspart  5 Units Subcutaneous TID WC  . insulin glargine  10 Units Subcutaneous Daily  . metoCLOPramide  10 mg Oral TID WC  . multivitamin  1 tablet Oral QHS  . pantoprazole  40 mg Oral BID  . sevelamer carbonate  4,000 mg Oral TID WC  . Warfarin - Pharmacist Dosing Inpatient   Does not apply q1800      Otelia Santee, MD 09/03/2018, 7:38 AM

## 2018-09-04 ENCOUNTER — Encounter (HOSPITAL_COMMUNITY): Payer: Self-pay

## 2018-09-04 LAB — BPAM RBC
Blood Product Expiration Date: 202001282359
ISSUE DATE / TIME: 201912300856
Unit Type and Rh: 5100

## 2018-09-04 LAB — CUP PACEART REMOTE DEVICE CHECK
Battery Remaining Longevity: 71 mo
Battery Remaining Percentage: 80 %
Battery Voltage: 2.98 V
Brady Statistic AP VP Percent: 1 %
Brady Statistic AP VS Percent: 1.5 %
Brady Statistic AS VS Percent: 98 %
Brady Statistic RA Percent Paced: 1 %
Brady Statistic RV Percent Paced: 1 %
Date Time Interrogation Session: 20191028154511
HighPow Impedance: 40 Ohm
HighPow Impedance: 40 Ohm
Implantable Lead Implant Date: 20100430
Implantable Lead Implant Date: 20100430
Implantable Lead Location: 753859
Implantable Lead Location: 753860
Implantable Lead Model: 7121
Implantable Pulse Generator Implant Date: 20180110
Lead Channel Impedance Value: 360 Ohm
Lead Channel Pacing Threshold Amplitude: 0.75 V
Lead Channel Pacing Threshold Amplitude: 1 V
Lead Channel Pacing Threshold Pulse Width: 0.5 ms
Lead Channel Pacing Threshold Pulse Width: 0.5 ms
Lead Channel Sensing Intrinsic Amplitude: 1.4 mV
Lead Channel Setting Pacing Amplitude: 2.5 V
Lead Channel Setting Pacing Amplitude: 2.5 V
Lead Channel Setting Pacing Pulse Width: 0.5 ms
Lead Channel Setting Sensing Sensitivity: 0.5 mV
MDC IDC MSMT LEADCHNL RA IMPEDANCE VALUE: 340 Ohm
MDC IDC MSMT LEADCHNL RV SENSING INTR AMPL: 12 mV
MDC IDC STAT BRADY AS VP PERCENT: 1 %
Pulse Gen Serial Number: 7383809

## 2018-09-04 LAB — TYPE AND SCREEN
ABO/RH(D): O POS
Antibody Screen: NEGATIVE
Unit division: 0

## 2018-09-06 ENCOUNTER — Ambulatory Visit (HOSPITAL_COMMUNITY): Payer: Self-pay | Admitting: Pharmacist

## 2018-09-06 DIAGNOSIS — Z5181 Encounter for therapeutic drug level monitoring: Secondary | ICD-10-CM

## 2018-09-06 DIAGNOSIS — Z95811 Presence of heart assist device: Secondary | ICD-10-CM

## 2018-09-06 LAB — POCT INR: INR: 2.8 (ref 2.0–3.0)

## 2018-09-07 ENCOUNTER — Other Ambulatory Visit: Payer: Self-pay

## 2018-09-07 ENCOUNTER — Ambulatory Visit (INDEPENDENT_AMBULATORY_CARE_PROVIDER_SITE_OTHER): Payer: Self-pay | Admitting: Vascular Surgery

## 2018-09-07 ENCOUNTER — Ambulatory Visit: Payer: Self-pay | Admitting: Vascular Surgery

## 2018-09-07 ENCOUNTER — Encounter: Payer: Self-pay | Admitting: Vascular Surgery

## 2018-09-07 VITALS — BP 100/60 | HR 94 | Temp 98.1°F | Resp 20 | Ht 67.0 in | Wt 180.0 lb

## 2018-09-07 DIAGNOSIS — N186 End stage renal disease: Secondary | ICD-10-CM

## 2018-09-07 DIAGNOSIS — Z992 Dependence on renal dialysis: Secondary | ICD-10-CM

## 2018-09-07 NOTE — Progress Notes (Signed)
    Subjective:     Patient ID: [name], male   DOB: 1966-05-31, 53 y.o.   MRN: [id number]  HPI [name] follows up after left arm AV graft.  This is unfortunately thrombosed.  He does have complaints of his hand where he cannot make a full fist.  He is using a squeeze ball at home which is helped some and he has good sensation in the hand.   Review of Systems Left hand malfunction    Objective:   Physical Exam Awake alert oriented Nonlabored respirations Left upper arm with persistent hematoma although swelling has improved No thrill or bruit in left upper extremity There are signals at the left radial and ulnar arteries at the wrist Cannot form a fist left hand    Assessment:     53 year old male with ventricular assist device recent underwent placement of a left arm AV graft that has subsequently failed and has had hematoma.  He is having pain in his left hand and cannot form a full fist    Plan:     I discussed with him pursuing occupational therapy at this time he does not want that but we can make a referral in the future if he would like.  He is currently on dialysis via catheter and I would not attempt any further access at this time particularly given his ongoing hand dysfunction.  I will be happy to see him on an as-needed basis peer     Ardell Aaronson C. Donzetta Matters, MD [facility] Office: [phone] Pager: [phone]

## 2018-09-10 ENCOUNTER — Other Ambulatory Visit (HOSPITAL_COMMUNITY): Payer: Self-pay | Admitting: Adult Health

## 2018-09-13 ENCOUNTER — Ambulatory Visit (HOSPITAL_COMMUNITY)
Admit: 2018-09-13 | Discharge: 2018-09-13 | Disposition: A | Discharge status: Home / Self Care | Payer: BLUE CROSS/BLUE SHIELD | Source: Ambulatory Visit | Attending: Internal Medicine | Admitting: Internal Medicine

## 2018-09-13 ENCOUNTER — Ambulatory Visit (HOSPITAL_COMMUNITY): Payer: Self-pay | Admitting: Pharmacist

## 2018-09-13 VITALS — BP 104/0 | HR 92 | Ht 68.0 in | Wt 180.2 lb

## 2018-09-13 DIAGNOSIS — E785 Hyperlipidemia, unspecified: Secondary | ICD-10-CM | POA: Insufficient documentation

## 2018-09-13 DIAGNOSIS — E1151 Type 2 diabetes mellitus with diabetic peripheral angiopathy without gangrene: Secondary | ICD-10-CM | POA: Insufficient documentation

## 2018-09-13 DIAGNOSIS — I5022 Chronic systolic (congestive) heart failure: Secondary | ICD-10-CM | POA: Insufficient documentation

## 2018-09-13 DIAGNOSIS — D6851 Activated protein C resistance: Secondary | ICD-10-CM | POA: Insufficient documentation

## 2018-09-13 DIAGNOSIS — Z794 Long term (current) use of insulin: Secondary | ICD-10-CM | POA: Diagnosis not present

## 2018-09-13 DIAGNOSIS — Z7982 Long term (current) use of aspirin: Secondary | ICD-10-CM | POA: Diagnosis not present

## 2018-09-13 DIAGNOSIS — I2510 Atherosclerotic heart disease of native coronary artery without angina pectoris: Secondary | ICD-10-CM | POA: Insufficient documentation

## 2018-09-13 DIAGNOSIS — I132 Hypertensive heart and chronic kidney disease with heart failure and with stage 5 chronic kidney disease, or end stage renal disease: Secondary | ICD-10-CM | POA: Diagnosis not present

## 2018-09-13 DIAGNOSIS — R531 Weakness: Secondary | ICD-10-CM | POA: Diagnosis not present

## 2018-09-13 DIAGNOSIS — N186 End stage renal disease: Secondary | ICD-10-CM | POA: Insufficient documentation

## 2018-09-13 DIAGNOSIS — I255 Ischemic cardiomyopathy: Secondary | ICD-10-CM | POA: Diagnosis not present

## 2018-09-13 DIAGNOSIS — Z95811 Presence of heart assist device: Secondary | ICD-10-CM

## 2018-09-13 DIAGNOSIS — Z79899 Other long term (current) drug therapy: Secondary | ICD-10-CM | POA: Diagnosis not present

## 2018-09-13 DIAGNOSIS — Z9049 Acquired absence of other specified parts of digestive tract: Secondary | ICD-10-CM | POA: Diagnosis not present

## 2018-09-13 DIAGNOSIS — Z7901 Long term (current) use of anticoagulants: Secondary | ICD-10-CM | POA: Diagnosis not present

## 2018-09-13 DIAGNOSIS — I4892 Unspecified atrial flutter: Secondary | ICD-10-CM | POA: Diagnosis not present

## 2018-09-13 DIAGNOSIS — Z992 Dependence on renal dialysis: Secondary | ICD-10-CM | POA: Diagnosis not present

## 2018-09-13 DIAGNOSIS — E1122 Type 2 diabetes mellitus with diabetic chronic kidney disease: Secondary | ICD-10-CM | POA: Diagnosis not present

## 2018-09-13 DIAGNOSIS — K3184 Gastroparesis: Secondary | ICD-10-CM | POA: Diagnosis not present

## 2018-09-13 DIAGNOSIS — K5090 Crohn's disease, unspecified, without complications: Secondary | ICD-10-CM | POA: Diagnosis not present

## 2018-09-13 DIAGNOSIS — Z5181 Encounter for therapeutic drug level monitoring: Secondary | ICD-10-CM

## 2018-09-13 DIAGNOSIS — Z86718 Personal history of other venous thrombosis and embolism: Secondary | ICD-10-CM | POA: Insufficient documentation

## 2018-09-13 DIAGNOSIS — Z955 Presence of coronary angioplasty implant and graft: Secondary | ICD-10-CM | POA: Diagnosis not present

## 2018-09-13 DIAGNOSIS — E1143 Type 2 diabetes mellitus with diabetic autonomic (poly)neuropathy: Secondary | ICD-10-CM | POA: Insufficient documentation

## 2018-09-13 DIAGNOSIS — I252 Old myocardial infarction: Secondary | ICD-10-CM | POA: Insufficient documentation

## 2018-09-13 DIAGNOSIS — K219 Gastro-esophageal reflux disease without esophagitis: Secondary | ICD-10-CM | POA: Insufficient documentation

## 2018-09-13 LAB — POCT INR: INR: 4.3 — AB (ref 2–3)

## 2018-09-13 NOTE — Progress Notes (Addendum)
Patient presents for hospital  follow up in [facility] today with son. Reports no problems with VAD equipment or concerns with drive line.   He is using cane today and ambulating on his own. Reports his overall stamina is improving. Says his appetite has improved and is fair to good.  Denies any major N&V; remains on erythromycin 250 mg tid. He also is taking his marinol and reglan as ordered and Ativan one time daily. He denies holding any meds, including warfarin.  He reports that the Doctor at the dialysis center lowered his dry weight yesterday. I spoke with dialysis center who informed me that the pt has been leaving center 1kg less than dry wt the last few treatments. His dry wt was dropped from 80kg to 79.5kg. Nurse assures me that his dry weight shouldn't be dropped any lower. I expressed how well the pt felt today and that his pump looked really good with very few events.   Pt saw Dr. Donzetta Matters last week who informed him that the new graft in his LUA is not going to work. [name] does not have full use of his left hand at this time. We will send pt for OT.   Vital Signs:  Doppler Pressure  104 Automatc BP:  114/77 (96) HR:  92 SPO2: 100%  Weight: 180.2 lb w/o eqt Last weight: 177.2 lb   VAD Indication: Destination therapy- Pt is listed for heart/kidney at [facility] status 4  VAD interrogation & Equipment Management: Speed:5500 Flow: 4.1 Power: 4.6w    PI: 5.3  Alarms: none Events:0-15  Fixed speed 5500 Low speed limit: 5200  Primary Controller:  Replace back up battery in 24 months. Back up controller:   Replace back up battery in 24 months.  Annual Equipment Maintenance on UBC/PM was performed on 06/2017.   I reviewed the LVAD parameters from today and compared the results to the patient's prior recorded data. LVAD interrogation was NEGATIVE for significant power changes, NEGATIVE for clinical alarms and STABLE for PI events/speed drops. No programming changes were made  and pump is functioning within specified parameters. Pt is performing daily controller and system monitor self tests along with completing weekly and monthly maintenance for LVAD equipment.  LVAD equipment check completed and is in good working order. Back-up equipment present.    Exit Site Care: Drive line is being maintained weekly by daughter. CDI today. Anchor secure. Pt provided with 10 weekly kits today.  Significant Events on VAD Support: -10/2016>> poss drive line infection, CT ABD neg, ID consult-doxy -11/09/16>> admit for poss drive line infection, IV abx -03/24/17>> doxy for poss drive line infection -05/04/17>> drive line debridement with wound-vac -06/2017>> drive line +proteus, IV abx, Bactrim x14 days  Device:St jude Dual Therapies: on 200 bpm Last check: 01/01/18 - 2 hrs Afl/Afib; EMI 04/02/18  BP & Labs: -LABS ARE PENDING AT DIALYSIS CENTER-SHOULD SEND TO [place] TOMORROW MAP 96  - Doppler is reflecting MAP  Hgb not done today; difficult venous access - No S/S of bleeding. Specifically denies melena/BRBPR or nosebleeds.  LDH not done today, difficult venous access - established baseline of 245- 350. Denies tea-colored urine. No power elevations noted on interrogation.   6 mo  Intermacs follow up completed including:  Quality of Life, KCCQ-12, and Neurocognitive trail making.   Pt completed 610 feet during 6 minute walk.  Back up controller:  11V backup battery charged during this visit.   Patient Instructions:  1. No change in medications. 2. Referred  to occupational therapy for left hand. 3. Return to clinic in 2 months.     [name] RN VAD Coordinator   Office: [phone] 24/7 Emergency VAD Pager: [phone]    Cardiology: Dr. Aundra Dubin  HPI: 53 y.o. with history of CAD s/p anterior MI in 2010 and NSTEMI in 3/18 with DES to pRCA and mLAD and ischemic cardiomyopathy now s/p Heartmate 3 LVAD and ESRD presents for followup of CHF/LVAD.  Patient developed  low output heart failure.  He was not deemed to be a good transplant candidate.  He was admitted in 10/18 and Heartmate 3 LVAD was placed.  He had baseline CKD stage 3 likely from diabetic nephropathy and CHF.  He developed peri-op AKI and ended up on dialysis.  No renal recovery to date.  St Jude ICD has been turned back on and ramp echo was done prior to discharge in 10/18, speed increased to 5600 rpm.   He was admitted in 11/18 with nausea/vomiting and abdominal pain.  This appears to have been due to diabetic gastroparesis (extensive GI evaluation). He had atrial flutter this admission that was converted to NSR with amiodarone.  He improved considerably with scheduled Reglan and erythromycin. He was re-admitted later in 11/18 with the same symptoms, suspect diabetic gastroparesis and was again treated with erythromycin.  He was not able to start domperidone due to prolonged QT interval. He had nausea again about a week ago but was able to manage at home with Phenergan suppositories.   Admitted 1/8 -> 09/16/17 for uncontrolled vomiting in setting of diabetic gastroparesis. Improved with phenergan suppositories and given erythromycin TID for 7 days post-discharge.    He was admitted in 2/19 for AV graft placement.  While in the hospital, we turned down his speed.  Since then, he has had considerably fewer PI events on HD days.    He was admitted in 4/19 with nausea/vomiting again, probably gastroparesis flare.   He was admitted in 5/19 with nausea/vomiting, likely gastroparesis flare, treated with erythromycin course which he is finishing.   He has been evaluated at [facility] by Dr. Derrill Kay (gastroparesis specialist).   Gastric emptying study, surprisingly, was near normal. However, electrogastrogram showed poor gastric accomodation/capacity, "bradygastria."  Frequent small meals were recommended. Also, early am nausea was thought to be possible GERD.    Admitted 6/26 - 03/19/18 for emergent VAD  exchange 03/01/18 in the setting of HM3 outflow graft kink. He required CVVHD post op, but was transtioned back to [facility] without problems. Course complicated by leukocytosis and fever. CT abd/chest negative for infection and blood cultures were negative. Post op pain well controlled withraretramadol. Able to tolerate diet. Evaluated by PT/OT and HH PT recommended for discharge. Ramp echo completed prior to discharge with speed optimization. Resumed81 mg ASA + coumadin with INR goal 2-3.  He was admitted 03/23/18 with nausea/gastroparesis symptoms and marked HTN at outpatient HD.  Erythromycin was restarted and gastroparesis symptoms were controlled with fall in BP and discharge on 03/25/18.   He was admitted in 9/19 for LUE AV fistula placement.  He was readmitted in 10/19 with his typical gastroparesis symptoms, unable to take po. The fistula later failed and was replaced with a graft.  However, the graft was nonfunctional and developed a hematoma at the surgical site.    He was admitted in 12/19 with nausea/vomiting from HD. Also with LDH increased to 427 in setting of extensive hematoma around his left arm AV graft.  He has had tingling  in his left hand and left grip is weak.    He returns today for followup of LVAD and CHF.  He has been doing well since last discharge.  No nausea/vomiting. Walking with a cane.  As above, still has tingling in his left hand and left hand grip is weak.      Reports taking Coumadin as prescribed and adherence to anticoagulation based dietary restrictions. Denies BRBPR or melena. No dark urine or hematuria.   Labs (11/18): hgb 10.4 => 8.7, LDH 213 Labs (08/04/17): hgb 9 Labs (1/19): HgbA1c 6.1 Labs (2/19): hgb 13.2 Labs (3/19): hgb 11.1 Labs (7/19): hgb 9.6 Labs (10/19: hgb 11.8 Labs (12/19): LDH 427 => 237, hgb 7.7  PMH: 1. Chronic systolic CHF: Ischemic cardiomyopathy. St Jude ICD.  - Echo (11/17) with EF 25-30%, moderate diastolic dysfunction, mild MR,  mildly dilated RV with moderately decreased systolic function, peak RV-RA gradient 48 mmHg.  - Echo (3/18) with EF 25-30%, wall motion abnormalities, normal RV size and systolic function.  - Admission 3/18 with cardiogenic shock and AKI, required milrinone gtt.  - Echo (8/18) with EF 20-25%, mild LVH, moderate LV dilation, mild MR.  - CPX (8/18) with peak VO2 12.4, VE/VCO2 slope 35, RER 1.05 => severe HR limitation.  - Heartmate 3 LVAD 10/18.  - Pump thrombosis in setting of HM3 outflow graft kink.  Pump exchange 6/19 for HM3.  2. CAD: Anterior MI 2005, had bifurcation stenting LAD/diagonal. Repeat cath 2010 with patent stents.  - NSTEMI 3/18: LHC with 99% ulcerated lesion proximal RCA, 95% mid LAD => PCI with DES to proximal RCA and mid LAD.  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  - Lower extremity venous dopplers (8/18): chronic DVT - V/Q scan (8/18): Normal, no PE.  4. Crohns disease: History of colectomy.  5. GERD 6. Diabetic gastroparesis: Admission 11/18 with abdominal pain, nausea/vomiting.  - [facility] workup 2019: Gastric emptying study near normal.  Negative breath test (no evidence for small bowel bacterial overgrowth).  Electrogastrogram showed poor gastric accomodation/capacity, "bradygastria."  7. HTN 8. Hyperlipidemia 9. Type II diabetes  10. ESRD: Post-op LVAD surgery.  He has had failed AV fistula and AV graft, now dialyzing via catheter . 11. Carotid dopplers (8/18): mild bilateral stenosis.  12. PFTs (8/18): Mild restriction (no IDL on CT chest).  13. Lung nodules: CT chest 8/18 with small bilateral nodules.  14. PAD: ABIs with moderate bilateral disease in 8/18.  15. Atrial flutter: In 11/18 associated with nausea/vomiting from gastroparesis.   Current Outpatient Medications  Medication Sig Dispense Refill  . amiodarone (PACERONE) 200 MG tablet Take 200 mg by mouth daily.    [name] aspirin EC 81 MG tablet Take 1 tablet (81 mg total) by mouth daily. 90 tablet 3  . atorvastatin  (LIPITOR) 40 MG tablet Take 1 tablet (40 mg total) by mouth daily at 6 PM. 30 tablet 6  . calcitRIOL (ROCALTROL) 0.5 MCG capsule Take 1 capsule (0.5 mcg total) by mouth every Monday, Wednesday, and Friday. 15 capsule 5  . citalopram (CELEXA) 20 MG tablet Take 1 tablet (20 mg total) by mouth daily. 30 tablet 6  . dronabinol (MARINOL) 2.5 MG capsule Take 1 capsule (2.5 mg total) by mouth 2 (two) times daily before lunch and supper. 60 capsule 5  . erythromycin (E-MYCIN) 250 MG tablet Take 1 tablet (250 mg total) by mouth 3 (three) times daily. 90 tablet 4  . insulin aspart (NOVOLOG) 100 UNIT/ML injection Inject 5 Units into the skin  3 (three) times daily with meals. 1 vial 0  . insulin glargine (LANTUS) 100 UNIT/ML injection Inject 0.1 mLs (10 Units total) into the skin daily. 10 mL 5  . isosorbide-hydrALAZINE (BIDIL) 20-37.5 MG tablet Take 1.5 tablets by mouth every 8 (eight) hours. Take 1.5 tablets every 8 hours on non dialysis days. (Patient taking differently: Take 1 tablet by mouth daily. ) 135 tablet 6  . lidocaine-prilocaine (EMLA) cream Apply 1 application topically as needed (to numb port).    . LORazepam (ATIVAN) 0.5 MG tablet Take 1 tablet (0.5 mg total) by mouth every 12 (twelve) hours as needed (nausea). 60 tablet 2  . metoCLOPramide (REGLAN) 10 MG tablet Take 1 tablet (10 mg total) by mouth 3 (three) times daily. 90 tablet 6  . pantoprazole (PROTONIX) 40 MG tablet Take 40 mg by mouth 2 (two) times daily.     [name] warfarin (COUMADIN) 5 MG tablet TAKE AS FOLLOWS: MON-WED-FRI 1&1/2 TABLETS DAILY. ON ALL OTHER DAYS 1 TABLET 50 tablet 6  . docusate sodium (COLACE) 100 MG capsule Take 100 mg by mouth daily as needed for mild constipation.    . multivitamin (RENA-VIT) TABS tablet Take 1 tablet by mouth daily.    . promethazine (PHENERGAN) 12.5 MG tablet Take 1 tablet (12.5 mg total) by mouth every 6 (six) hours as needed for nausea or vomiting. (Patient not taking: Reported on 09/13/2018) 30 tablet 0   . sevelamer carbonate (RENVELA) 800 MG tablet Take 1,600-4,000 mg by mouth See admin instructions. 4035m three times daily with meals and 16065mtwice daily with snacks    . traMADol (ULTRAM) 50 MG tablet Take 1 tablet (50 mg total) by mouth every 6 (six) hours as needed for moderate pain. (Patient not taking: Reported on 09/13/2018) 30 tablet 0   No current facility-administered medications for this encounter.     Metformin and related   Review of systems complete and found to be negative unless listed in HPI.    BP (!) 104/0   Pulse 92   Ht 5' 8"  (1.727 m)   Wt 81.7 kg (180 lb 3.2 oz)   BMI 27.40 kg/m  MAP 92  VAD interrogation & Equipment Management: See nurse's note above.   General: Well appearing this am. NAD.  HEENT: Normal. Neck: Supple, JVP 7-8 cm. Carotids OK.  Cardiac:  Mechanical heart sounds with LVAD hum present.  Lungs:  CTAB, normal effort.  Abdomen:  NT, ND, no HSM. No bruits or masses. +BS  LVAD exit site: Well-healed and incorporated. Dressing dry and intact. No erythema or drainage. Stabilization device present and accurately applied. Driveline dressing changed daily per sterile technique. Extremities:  Warm and dry. No cyanosis, clubbing, rash, or edema.  Neuro:  Alert & oriented x 3. Cranial nerves grossly intact. Moves all 4 extremities w/o difficulty but left hand grip is weak. Affect pleasant     ASSESSMENT AND PLAN: 1. Chronic systolic CHF: Ischemic cardiomyopathy.  St Jude ICD.  NSTEMI in March 2018 with DES to LAD and RCA, complicated by cardiogenic shock, low output requiring milrinone.  Echo 8/18 with EF 20-25%, moderate MR.  CPX (8/18) with severe functional impairment due to HF.  He is s/p HM3 LVAD placement. He [facility]  He had pump exchange for another HM3.  LVAD parameters reviewed today and stable.  No complaints. MAP controlled. NYHA II. Volume status stable on exam.   - Continue Bidil 1.5 tab  tid on non-HD days.    - Continue ASA 81.  Continue warfarin INR 2-3.    - He has been referred to [facility] for evaluation for heart/kidney transplant. 2. ESRD: Developed peri-operatively after HM3 placement.  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  - Would PD be an option for him? Will need to discuss with renal. 3. CAD: NSTEMI in 3/18. LHC with 99% ulcerated lesion proximal RCA with left to right collaterals, 95% mid LAD stenosis after mid LAD stent. s/p PCI to RCA and LAD on 11/25/16.   - Continue statin and ASA. 4. h/o DVTs: Factor V Leiden heterozygote. - On coumadin. 5. Diabetic gastroparesis: Difficult to manage. He has seen gastroparesis specialist at [facility]. Surprisingly, gastric emptying study was normal.  However, electrogastrogram showed poor gastric accomodation/capacity.  Therefore, small frequent meals recommended.  Also morning nausea thought to be GERD and Protonix was increased.  Nausea seems to drive markedly high BP (resolves with nausea control).  - Continue reglan 5 mg tid/ac and Marinol.  - Continue Protonix bid.  - Make effort to eat small, more frequent meals.  - He will continue erythromycin.  6. DM: HgbA1c well-controlled recently.   7. HTN: MAP controlled on current meds today.  HTN seems to be driven by nausea (see above), so will try to minimize nausea.  8. PAD: Moderate bilateral disease on ABIs in 8/18.  Denies claudication currently, no pedal ulcers.  9. Atrial flutter: Paroxysmal.  He has been in NSR recently.   - Continue amiodarone.  Will need to check LFTs and TSH with next labs. He will need a regular eye exam.  10. Left hand tingling and grip weakness: Present since he developed left arm hematoma from failed AV graft.  This has not improved.  - Refer to OT.  - I will also refer to neurology as nerve conduction study may be helpful.   Loralie Champagne, MD  09/13/2018

## 2018-09-13 NOTE — Patient Instructions (Signed)
1. No change in medications. 2. Will refer you to occupational therapy for hand. 3. Return to clinic in 2 months.

## 2018-09-18 ENCOUNTER — Ambulatory Visit (HOSPITAL_COMMUNITY): Payer: Self-pay | Admitting: Pharmacist

## 2018-09-18 LAB — POCT INR: INR: 2.2 (ref 2–3)

## 2018-09-25 ENCOUNTER — Ambulatory Visit: Payer: BLUE CROSS/BLUE SHIELD | Admitting: Occupational Therapy

## 2018-09-27 ENCOUNTER — Ambulatory Visit (HOSPITAL_COMMUNITY): Payer: Self-pay | Admitting: Pharmacist

## 2018-09-27 DIAGNOSIS — Z5181 Encounter for therapeutic drug level monitoring: Secondary | ICD-10-CM

## 2018-09-27 DIAGNOSIS — Z95811 Presence of heart assist device: Secondary | ICD-10-CM

## 2018-09-27 LAB — POCT INR: INR: 2.7 (ref 2.0–3.0)

## 2018-10-04 ENCOUNTER — Ambulatory Visit: Payer: BLUE CROSS/BLUE SHIELD | Attending: Cardiology | Admitting: Occupational Therapy

## 2018-10-04 ENCOUNTER — Ambulatory Visit (HOSPITAL_COMMUNITY): Payer: Self-pay | Admitting: Pharmacist

## 2018-10-04 DIAGNOSIS — Z95811 Presence of heart assist device: Secondary | ICD-10-CM

## 2018-10-04 DIAGNOSIS — M79642 Pain in left hand: Secondary | ICD-10-CM

## 2018-10-04 DIAGNOSIS — M6281 Muscle weakness (generalized): Secondary | ICD-10-CM

## 2018-10-04 DIAGNOSIS — M25642 Stiffness of left hand, not elsewhere classified: Secondary | ICD-10-CM | POA: Diagnosis present

## 2018-10-04 DIAGNOSIS — R208 Other disturbances of skin sensation: Secondary | ICD-10-CM | POA: Diagnosis present

## 2018-10-04 DIAGNOSIS — Z5181 Encounter for therapeutic drug level monitoring: Secondary | ICD-10-CM

## 2018-10-04 LAB — POCT INR: INR: 2.5 (ref 2–3)

## 2018-10-04 NOTE — Therapy (Signed)
[facility] [address] [place], [place], [address] Phone: [phone]   Fax:  [phone]  Occupational Therapy Evaluation  Patient Details  Name: [name] MRN: [id number] Date of Birth: Nov 28, 1965 Referring Provider (OT): Dr. Marigene Ehlers   Encounter Date: 10/04/2018  OT End of Session - 10/04/18 1749    Visit Number  1    Number of Visits  25    Date for OT Re-Evaluation  01/02/19    Authorization Type  [employer]    OT Start Time  1105    OT Stop Time  1145    OT Time Calculation (min)  40 min    Activity Tolerance  Patient tolerated treatment well    Behavior During Therapy  [facility] for tasks assessed/performed       Past Medical History:  Diagnosis Date  . Angina   . ASCVD (arteriosclerotic cardiovascular disease)    , Anterior infarction 2005, LAD diagonal bifurcation intervention 03/2004  . Automatic implantable cardiac defibrillator -St. Jude's       . Benign neoplasm of colon   . CHF (congestive heart failure) ([place])   . Chronic systolic heart failure ([place])   . Coronary artery disease     Widely patent previously placed stents in the left anterior   . Crohn's disease ([place])   . 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 Dialysis patient ([place])   . Dyspnea   . Gastroparesis   . GERD (gastroesophageal reflux disease)   . High cholesterol   . Hyperlipidemia   . Hypersomnolent    Previous diagnosis of narcolepsy  . Hypertension, essential   . Ischemic cardiomyopathy    Ejection fraction 15-20% catheterization 2010  . Type II or unspecified type diabetes mellitus without mention of complication, not stated as uncontrolled   . Unspecified gastritis and gastroduodenitis without mention of hemorrhage     Past Surgical History:  Procedure Laterality Date  . A/V SHUNTOGRAM N/A 05/24/2018   Procedure: A/V SHUNTOGRAM;  Surgeon: Marty Heck, MD;  Location: [place] [facility];  Service:  Cardiovascular;  Laterality: N/A;  . APPENDECTOMY    . AV FISTULA PLACEMENT Right 06/26/2017   Procedure: ARTERIOVENOUS (AV) FISTULA CREATION VERSUS GRAFT RIGHT  ARM;  Surgeon: Conrad Olivia Lopez de Gutierrez, MD;  Location: [place];  Service: Vascular;  Laterality: Right;  . AV FISTULA PLACEMENT Right 10/31/2017   Procedure: INSERTION OF ARTERIOVENOUS (AV) ARTEGRAFT,  RIGHT UPPER ARM;  Surgeon: Conrad [place], MD;  Location: [place];  Service: Vascular;  Laterality: Right;  . AV FISTULA PLACEMENT Left 05/29/2018   Procedure: ARTERIOVENOUS (AV) FISTULA CREATION  LEFT UPPER EXTREMITY;  Surgeon: Waynetta Sandy, MD;  Location: [place];  Service: Vascular;  Laterality: Left;  . AV FISTULA PLACEMENT Left 08/09/2018   Procedure: INSERTION OF ARTERIOVENOUS (AV) GORE-TEX GRAFT LEFT UPPER ARM;  Surgeon: Waynetta Sandy, MD;  Location: [place];  Service: Vascular;  Laterality: Left;  . CARDIAC DEFIBRILLATOR PLACEMENT  2010   St. Jude ICD  . CENTRAL LINE INSERTION Right 05/24/2018   Procedure: CENTRAL LINE INSERTION;  Surgeon: Marty Heck, MD;  Location: [place] [facility];  Service: Cardiovascular;  Laterality: Right;  . COLECTOMY  ~ 2003   "for Crohn's" ascending colon.    . CORONARY STENT INTERVENTION N/A 11/25/2016   Procedure: Coronary Stent Intervention;  Surgeon: Leonie Man, MD;  Location: [place] [facility];  Service: Cardiovascular;  Laterality: N/A;  .  EP IMPLANTABLE DEVICE N/A 09/14/2016   Procedure: ICD Generator Changeout;  Surgeon: Deboraha Sprang, MD;  Location: [place] [facility];  Service: Cardiovascular;  Laterality: N/A;  . ESOPHAGOGASTRODUODENOSCOPY N/A 07/12/2017   Procedure: ESOPHAGOGASTRODUODENOSCOPY (EGD);  Surgeon: Jerene Bears, MD;  Location: [facility];  Service: Gastroenterology;  Laterality: N/A;  VAD pt so VAD team will need to accompany pt.   [name] FETAL SURGERY FOR CONGENITAL HERNIA     ????? pt has no knowledge of this..    . FISTULOGRAM Left 08/09/2018   Procedure:  FISTULOGRAM LEFT ARM;  Surgeon: Waynetta Sandy, MD;  Location: [place];  Service: Vascular;  Laterality: Left;  . INGUINAL HERNIA REPAIR    . INSERTION OF DIALYSIS CATHETER Right 06/26/2017   Procedure: INSERTION OF TUNNELED  DIALYSIS CATHETER RIGHT INTERNAL JUGULAR;  Surgeon: Conrad [place], MD;  Location: [place];  Service: Vascular;  Laterality: Right;  . INSERTION OF IMPLANTABLE LEFT VENTRICULAR ASSIST DEVICE N/A 06/12/2017   Procedure: INSERTION OF IMPLANTABLE LEFT VENTRICULAR ASSIST [place] 3;  Surgeon: Ivin Poot, MD;  Location: [place];  Service: Open Heart Surgery;  Laterality: N/A;  HM3 LVAD  CIRC ARREST  NITRIC OXIDE  . INSERTION OF IMPLANTABLE LEFT VENTRICULAR ASSIST DEVICE N/A 02/28/2018   Procedure: REDO INSERTION OF IMPLANTABLE LEFT VENTRICULAR ASSIST DEVICE - HEARTMATE 3;  Surgeon: Ivin Poot, MD;  Location: [place];  Service: Open Heart Surgery;  Laterality: N/A;  . IR REMOVAL TUN CV CATH W/O FL  02/26/2018  . IR THORACENTESIS ASP PLEURAL SPACE W/IMG GUIDE  06/28/2017  . LIGATION OF ARTERIOVENOUS  FISTULA Right 10/31/2017   Procedure: LIGATION OF BRACHIO-BASILIC VEIN TRANSPOSITION RIGHT ARM;  Surgeon: Conrad [place] Bay, MD;  Location: [facility];  Service: Vascular;  Laterality: Right;  . MULTIPLE EXTRACTIONS WITH ALVEOLOPLASTY N/A 06/08/2017   Procedure: MULTIPLE EXTRACTION WITH ALVEOLOPLASTY AND PRE PROSTHETIC SURGERY AS NEEDED;  Surgeon: Lenn Cal, DDS;  Location: [place];  Service: Oral Surgery;  Laterality: N/A;  . RIGHT HEART CATH N/A 06/07/2017   Procedure: RIGHT HEART CATH;  Surgeon: Larey Dresser, MD;  Location: [place] [facility];  Service: Cardiovascular;  Laterality: N/A;  . RIGHT HEART CATH N/A 02/08/2018   Procedure: RIGHT HEART CATH;  Surgeon: Larey Dresser, MD;  Location: [place] [facility];  Service: Cardiovascular;  Laterality: N/A;  . RIGHT HEART CATH N/A 08/09/2018   Procedure: RIGHT HEART CATH;  Surgeon: Larey Dresser, MD;  Location:  [place] [facility];  Service: Cardiovascular;  Laterality: N/A;  . RIGHT/LEFT HEART CATH AND CORONARY ANGIOGRAPHY N/A 11/23/2016   Procedure: Right/Left Heart Cath and Coronary Angiography;  Surgeon: Larey Dresser, MD;  Location: [place] [facility];  Service: Cardiovascular;  Laterality: N/A;  . TEE WITHOUT CARDIOVERSION N/A 06/12/2017   Procedure: TRANSESOPHAGEAL ECHOCARDIOGRAM (TEE);  Surgeon: Prescott Gum, Collier [place]ina, MD;  Location: [place];  Service: Open Heart Surgery;  Laterality: N/A;  . TEE WITHOUT CARDIOVERSION N/A 02/28/2018   Procedure: TRANSESOPHAGEAL ECHOCARDIOGRAM (TEE);  Surgeon: Prescott Gum, Collier [place], MD;  Location: [place];  Service: Open Heart Surgery;  Laterality: N/A;  . TEMPORARY DIALYSIS CATHETER Left 05/24/2018   Procedure: TEMPORARY DIALYSIS CATHETER;  Surgeon: Marty Heck, MD;  Location: [place] [facility];  Service: Cardiovascular;  Laterality: Left;  . THROMBECTOMY AND REVISION OF ARTERIOVENTOUS (AV) GORETEX  GRAFT Right 12/12/2017   Procedure: THROMBECTOMY ARTERIOVENTOUS (AV) GORETEX  GRAFT RIGHT UPPER ARM;  Surgeon: Conrad Bosque, MD;  Location[facility];  Service: Vascular;  Laterality: Right;    There were no vitals filed for this visit.  Subjective Assessment - 10/04/18 1113    Subjective   Pt reports hand weakness s/p dialysis graft placement    Pertinent History  .PMH: ischemic cardimyopathy, LVAD 10/18, ESRD, dialysis M-W-F, 9/19 fistula placement, graft placement in LUE 10/19, catheter in left chest,  DMII    Limitations  no estim!    Patient Stated Goals  regain use of LUE    Currently in Pain?  Yes    Pain Score  1     Pain Location  Hand    Pain Orientation  Left    Pain Descriptors / Indicators  Tingling    Pain Type  Acute pain    Pain Onset  More than a month ago    Aggravating Factors   unknown    Pain Relieving Factors  unknown    Multiple Pain Sites  No        [facility] OT Assessment - 10/04/18 1114      Assessment   Medical Diagnosis  LUE weakness s/p  dialysis graft placement    Referring Provider (OT)  Dr. Marigene Ehlers    Onset Date/Surgical Date  06/24/19   graft placement     Precautions   Precautions  ICD/Pacemaker    Precaution Comments  no estim, graft LUE upper arm, dialysis cather L chest      Home  Environment   Family/patient expects to be discharged to:  Private residence    [place]    Lives With  Daughter      Prior Function   Level of Independence  Independent    Vocation  On disability      ADL   Eating/Feeding  Independent    Grooming  Minimal assistance   son assists with shaving   Upper Body Bathing  Moderate assistance   sponge bathes seated on bed unable to get LVAD wet   Lower Body Bathing  Moderate assistance    Upper Body Dressing  Moderate assistance    Lower Body Dressing  Minimal assistance    Toilet Transfer  Modified independent    Tub/Shower Transfer  Other (comment)   not applicable   ADL comments  Unable to open a soda bottle      IADL   Shopping  Assistance for transportation;Needs to be accompanied on any shopping trip    Light Housekeeping  Needs help with all home maintenance tasks    Meal Prep  Needs to have meals prepared and served    Medication Management  --   Family assist due to decreased LUE function     Mobility   Mobility Status  Independent   with cane     Written Expression   Dominant Hand  Right      Cognition   Overall Cognitive Status  Within Functional Limits for tasks assessed      Sensation   Light Touch  Impaired by gross assessment      Coordination   Fine Motor Movements are Fluid and Coordinated  No    Box and Blocks  RUE 60, LUE 28      ROM / Strength   AROM / PROM / Strength  AROM      AROM   Overall AROM   Deficits;Due to pain    Overall AROM Comments  RUE shoulder flexion  120, LUE shoulder flexion 105,R elbow ext -15,  L elbow ext -40, grossly 75-80% composite finger flexion, 95% extension      Right  Hand AROM   R Thumb MCP 0-60  65 Degrees    R Thumb IP 0-80  0 Degrees    R Thumb Opposition to Index  --   unable   R Index  MCP 0-90  85 Degrees    R Index PIP 0-100  20 Degrees    R Index DIP 0-70  0 Degrees    R Long  MCP 0-90  90 Degrees    R Long PIP 0-100  60 Degrees    R Long DIP 0-70  25 Degrees    R Ring  MCP 0-90  85 Degrees    R Ring PIP 0-100  85 Degrees    R Ring DIP 0-70  55 Degrees    R Little  MCP 0-90  90 Degrees    R Little PIP 0-100  90 Degrees    R Little DIP 0-70  60 Degrees      Hand Function   Right Hand Grip (lbs)  50    Left Hand Grip (lbs)  5                        OT Short Term Goals - 10/04/18 1759      OT SHORT TERM GOAL #1   Title  I with HEP    Time  6    Period  Weeks    Status  New    Target Date  11/18/18      OT SHORT TERM GOAL #2   Title  Pt will demonstrate 90% composite finger flexion for LUE for increased functional use.    Baseline  grossly 75-80%    Time  6    Period  Weeks    Status  New      OT SHORT TERM GOAL #3   Title  Pt will increase LUE grip strength to 15 lbs for increased functional use during ADLS.    Baseline  RUE 50, LUE 5    Time  6    Period  Weeks    Status  New      OT SHORT TERM GOAL #4   Title  Pt will dress himself modified independently.    Time  6    Period  Weeks    Status  New      OT SHORT TERM GOAL #5   Title  Pt will demonstrate ability to open a bottle and fasten buttons modified indepdently using AE prn.    Time  6    Period  Weeks    Status  New        OT Long Term Goals - 10/04/18 1802      OT LONG TERM GOAL #1   Title  Pt will demonstrate increased LUE functional use in order to change the battery in his LVAD modified independently.    Time  12    Period  Weeks    Status  New    Target Date  01/02/19      OT LONG TERM GOAL #2   Title  Pt will demonstrate adequate LUE strength and endurance for pt to make his bed, fold laundry and wash dishes modified  independently.    Time  12    Period  Weeks    Status  New  OT LONG TERM GOAL #3   Title  Pt demonstrate improved LUE functional use as evidenced by increasing box/ blocks score to 35 blocks or greater.     Baseline  RUE 60, LUE 28     Time  12    Period  Weeks    Status  New      OT LONG TERM GOAL #4   Title  Pt will verbalize understanding of adpated strategies/ AE to maximize I with ADLs.    Time  12    Period  Weeks    Status  New            Plan - 10/04/18 1750    Clinical Impression Statement  Pt is a 53 y.o male with ESRD on dialysis, who developed left hand weakness after graft placement LUE. Pt presents wot occupational therapy with the following deficits: decreased strength, decreased ROM, decreased LUE functional use, and pain which impedes perfromance of ADLS /IADLS. Pt can benefit from skilled occupational therapy to maximize pt's functional use of LUE in order to increased I with ADLS/IADLs.    Occupational Profile and client history currently impacting functional performance  Prior to placement of graft in LUE pt reports he was I with ADLs and driving.PMH: ischemic cardimyopathy, LVAD 10/18, ESRD, dialysis M-W-F, 9/19 fistula placement, graft placement in LUE 10/19, catheter in left chest,  DMII    Occupational performance deficits (Please refer to evaluation for details):  IADL's;ADL's;Play;Leisure;Social Participation    Rehab Potential  Fair    Current Impairments/barriers affecting progress:  severity of deficits, complex medical hx, pain    OT Frequency  2x / week   plus eval   OT Duration  12 weeks       Patient will benefit from skilled therapeutic intervention in order to improve the following deficits and impairments:  Increased edema, Impaired flexibility, Impaired sensation, Pain, Decreased coordination, Decreased activity tolerance, Decreased endurance, Decreased range of motion, Decreased strength, Impaired UE functional use, Impaired perceived  functional ability, Decreased safety awareness, Difficulty walking, Decreased knowledge of precautions, Decreased balance  Visit Diagnosis: Muscle weakness (generalized) - Plan: Ot plan of care cert/re-cert  Pain in left hand - Plan: Ot plan of care cert/re-cert  Stiffness of left hand, not elsewhere classified - Plan: Ot plan of care cert/re-cert  Other disturbances of skin sensation - Plan: Ot plan of care cert/re-cert    Problem List Patient Active Problem List   Diagnosis Date Noted  . Subtherapeutic international normalized ratio (INR) 08/28/2018  . Elevated LDH 08/22/2018  . Acute vomiting   . Nausea & vomiting 06/11/2018  . Heart failure ([place]) 05/23/2018  . Hypertensive urgency 03/25/2018  . Benign essential HTN   . Crohn's disease with complication ([place])   . Diabetes mellitus type 2 in nonobese (HCC)   . Anemia of chronic disease   . Sinus tachycardia   . Leukocytosis   . Left ventricular assist device (LVAD) complication 81/09/7508  . Left ventricular assist device (LVAD) complication, initial encounter 02/28/2018  . Factor V Leiden carrier ([place]) 01/10/2018  . History of creation of ostomy ([place]) 01/10/2018  . Hypercoagulable state ([place]) 01/10/2018  . ESRD (end stage renal disease) ([place]) 12/11/2017  . Left ventricular assist device present ([place]) 12/05/2017  . ESRD (end stage renal disease) on dialysis ([place]) 10/29/2017  . Nausea with vomiting 09/12/2017  . Intractable nausea and vomiting 07/31/2017  . 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) ([place]) 07/10/2017  . LVAD (left  ventricular assist device) present ([place])   . Palliative care by specialist   . Chronic systolic CHF (congestive heart failure) ([place]) 06/07/2017  . ACP (advance care planning)   . Encounter for central line placement   . Diarrhea   . Palliative care encounter   . Nausea   . CHF exacerbation ([place]) 04/05/2017  . Diabetic keto-acidosis ([place]) 04/05/2017  . Type 2 diabetes mellitus with  complication, with long-term current use of insulin ([place]) 03/08/2017  . ESRD on dialysis ([place]) 01/17/2017  . AKI (acute kidney injury) ([place])   . Non-ST elevation (NSTEMI) myocardial infarction ([place])   . CHF (congestive heart failure) ([place]) 09/02/2016  . Elevated serum creatinine   . Encounter for therapeutic drug monitoring 08/03/2016  . Chest pain 04/26/2012  . SVT (supraventricular tachycardia) ([place]) 04/26/2012  . Gastroparesis 11/18/2011  . Nausea and vomiting in adult 11/17/2011  . Coronary artery disease   . Ischemic cardiomyopathy   . Essential hypertension   . Automatic implantable cardioverter-defibrillator in situ   . Deep venous thrombosis ([place])   . Hypersomnolent   . POLYP, COLON 09/25/2008  . Type 2 diabetes mellitus ([place]) 09/25/2008  . Dyslipidemia, goal LDL below 70 09/25/2008  . GASTRITIS 09/25/2008  . DIVERTICULOSIS, COLON 09/25/2008  . RECTAL MASS 09/25/2008  . History of Crohn's disease 09/05/2006    , 10/04/2018, 6:18 PM Theone Murdoch, OTR/L [phone] Phone: [phone] 6:18 PM 10/04/18 [facility] [address] [place], [place], [address] Phone: [phone]   Fax:  [phone]  Name: MIRKO TAILOR MRN: [id number] Date of Birth: Jan 17, 1966

## 2018-10-09 ENCOUNTER — Ambulatory Visit: Payer: BLUE CROSS/BLUE SHIELD | Attending: Cardiology | Admitting: Occupational Therapy

## 2018-10-09 ENCOUNTER — Ambulatory Visit (INDEPENDENT_AMBULATORY_CARE_PROVIDER_SITE_OTHER): Payer: BLUE CROSS/BLUE SHIELD | Admitting: Podiatry

## 2018-10-09 DIAGNOSIS — B351 Tinea unguium: Secondary | ICD-10-CM

## 2018-10-09 DIAGNOSIS — R208 Other disturbances of skin sensation: Secondary | ICD-10-CM

## 2018-10-09 DIAGNOSIS — M79674 Pain in right toe(s): Secondary | ICD-10-CM | POA: Diagnosis not present

## 2018-10-09 DIAGNOSIS — M79642 Pain in left hand: Secondary | ICD-10-CM

## 2018-10-09 DIAGNOSIS — M25642 Stiffness of left hand, not elsewhere classified: Secondary | ICD-10-CM | POA: Diagnosis present

## 2018-10-09 DIAGNOSIS — E1122 Type 2 diabetes mellitus with diabetic chronic kidney disease: Secondary | ICD-10-CM

## 2018-10-09 DIAGNOSIS — M79675 Pain in left toe(s): Secondary | ICD-10-CM

## 2018-10-09 DIAGNOSIS — N186 End stage renal disease: Secondary | ICD-10-CM

## 2018-10-09 DIAGNOSIS — M6281 Muscle weakness (generalized): Secondary | ICD-10-CM

## 2018-10-09 NOTE — Patient Instructions (Addendum)
  Use your right hand to help your left bend.   Active Hook Fist   With fingers and knuckles straight, bend middle and tip joints. Do not bend large knuckles. Repeat _10-15___ times. Do _4-6___ sessions per day.  MP Flexion (Active)   With back of hand on table, bend large knuckles as far as they will go, keeping small joints straight. Repeat _10-15___ times. Do __4-6__ sessions per day. Activity: Reach into a narrow container.*      Finger Flexion / Extension   With palm up, bend fingers of left hand toward palm, making a  fist. Straighten fingers, opening fist. Repeat sequence _10-15___ times per session. Do _4-6__ sessions per day. Hand Variation: Palm down   Copyright  VHI. All rights reserved.      AROM: Wrist Extension   .  With _left___ palm down, bend wrist up. Repeat __15__ times per set.  Do __4-6__ sessions per day.    1. Grip Strengthening (Resistive Putty)   Squeeze putty using thumb and all fingers. Repeat _20___ times. Do __2__ sessions per day.   Place a washcloth on tabletop, crumple the washcloth into your hand then push it back out flat with the fingers of your left hand 10 reps 2x day

## 2018-10-09 NOTE — Patient Instructions (Signed)
Onychomycosis/Fungal Toenails  WHAT IS IT? An infection that lies within the keratin of your nail plate that is caused by a fungus.  WHY ME? Fungal infections affect all ages, sexes, races, and creeds.  There may be many factors that predispose you to a fungal infection such as age, coexisting medical conditions such as diabetes, or an autoimmune disease; stress, medications, fatigue, genetics, etc.  Bottom line: fungus thrives in a warm, moist environment and your shoes offer such a location.  IS IT CONTAGIOUS? Theoretically, yes.  You do not want to share shoes, nail clippers or files with someone who has fungal toenails.  Walking around barefoot in the same room or sleeping in the same bed is unlikely to transfer the organism.  It is important to realize, however, that fungus can spread easily from one nail to the next on the same foot.  HOW DO WE TREAT THIS?  There are several ways to treat this condition.  Treatment may depend on many factors such as age, medications, pregnancy, liver and kidney conditions, etc.  It is best to ask your doctor which options are available to you.  1. No treatment.   Unlike many other medical concerns, you can live with this condition.  However for many people this can be a painful condition and may lead to ingrown toenails or a bacterial infection.  It is recommended that you keep the nails cut short to help reduce the amount of fungal nail. 2. Topical treatment.  These range from herbal remedies to prescription strength nail lacquers.  About 40-50% effective, topicals require twice daily application for approximately 9 to 12 months or until an entirely new nail has grown out.  The most effective topicals are medical grade medications available through physicians offices. 3. Oral antifungal medications.  With an 80-90% cure rate, the most common oral medication requires 3 to 4 months of therapy and stays in your system for a year as the new nail grows out.  Oral  antifungal medications do require blood work to make sure it is a safe drug for you.  A liver function panel will be performed prior to starting the medication and after the first month of treatment.  It is important to have the blood work performed to avoid any harmful side effects.  In general, this medication safe but blood work is required. 4. Laser Therapy.  This treatment is performed by applying a specialized laser to the affected nail plate.  This therapy is noninvasive, fast, and non-painful.  It is not covered by insurance and is therefore, out of pocket.  The results have been very good with a 80-95% cure rate.  [facility] is the only practice in the area to offer this therapy. 5. Permanent Nail Avulsion.  Removing the entire nail so that a new nail will not grow back. 

## 2018-10-09 NOTE — Therapy (Signed)
[facility] [address] [place], [place], [address] Phone: [phone]   Fax:  [phone]  Occupational Therapy Treatment  Patient Details  Name: [name] MRN: [id number] Date of Birth: [birth year] Referring Provider (OT): Dr. Marigene Ehlers   Encounter Date: 10/09/2018  OT End of Session - 10/09/18 0813    Visit Number  2    Number of Visits  25    Date for OT Re-Evaluation  01/02/19    Authorization Type  BC[employer]    OT Start Time  0804    OT Stop Time  0845    OT Time Calculation (min)  41 min       Past Medical History:  Diagnosis Date  . Angina   . ASCVD (arteriosclerotic cardiovascular disease)    , Anterior infarction 2005, LAD diagonal bifurcation intervention 03/2004  . Automatic implantable cardiac defibrillator -St. Jude's       . Benign neoplasm of colon   . CHF (congestive heart failure) ([place])   . Chronic systolic heart failure ([place])   . Coronary artery disease     Widely patent previously placed stents in the left anterior   . Crohn's disease ([place])   . 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 Dialysis patient ([place])   . Dyspnea   . Gastroparesis   . GERD (gastroesophageal reflux disease)   . High cholesterol   . Hyperlipidemia   . Hypersomnolent    Previous diagnosis of narcolepsy  . Hypertension, essential   . Ischemic cardiomyopathy    Ejection fraction 15-20% catheterization 2010  . Type II or unspecified type diabetes mellitus without mention of complication, not stated as uncontrolled   . Unspecified gastritis and gastroduodenitis without mention of hemorrhage     Past Surgical History:  Procedure Laterality Date  . A/V SHUNTOGRAM N/A 05/24/2018   Procedure: A/V SHUNTOGRAM;  Surgeon: Marty Heck, MD;  Location: [facility];  Service: Cardiovascular;  Laterality: N/A;  . APPENDECTOMY    . AV FISTULA PLACEMENT Right 06/26/2017   Procedure:  ARTERIOVENOUS (AV) FISTULA CREATION VERSUS GRAFT RIGHT  ARM;  Surgeon: Conrad Gladbrook, MD;  Location: [facility];  Service: Vascular;  Laterality: Right;  . AV FISTULA PLACEMENT Right 10/31/2017   Procedure: INSERTION OF ARTERIOVENOUS (AV) ARTEGRAFT,  RIGHT UPPER ARM;  Surgeon: Conrad [place], MD;  Location: [facility];  Service: Vascular;  Laterality: Right;  . AV FISTULA PLACEMENT Left 05/29/2018   Procedure: ARTERIOVENOUS (AV) FISTULA CREATION  LEFT UPPER EXTREMITY;  Surgeon: Waynetta Sandy, MD;  Location: [facility];  Service: Vascular;  Laterality: Left;  . AV FISTULA PLACEMENT Left 08/09/2018   Procedure: INSERTION OF ARTERIOVENOUS (AV) GORE-TEX GRAFT LEFT UPPER ARM;  Surgeon: Waynetta Sandy, MD;  Location: [facility];  Service: Vascular;  Laterality: Left;  . CARDIAC DEFIBRILLATOR PLACEMENT  2010   St. Jude ICD  . CENTRAL LINE INSERTION Right 05/24/2018   Procedure: CENTRAL LINE INSERTION;  Surgeon: Marty Heck, MD;  Location: [facility];  Service: Cardiovascular;  Laterality: Right;  . COLECTOMY  ~ 2003   "for Crohn's" ascending colon.    . CORONARY STENT INTERVENTION N/A 11/25/2016   Procedure: Coronary Stent Intervention;  Surgeon: Leonie Man, MD;  Location: [facility];  Service: Cardiovascular;  Laterality: N/A;  . EP IMPLANTABLE DEVICE N/A 09/14/2016   Procedure: ICD Generator Changeout;  Surgeon: Deboraha Sprang, MD;  Location: [facility]  INVASIVE [facility] LAB;  Service: Cardiovascular;  Laterality: N/A;  . ESOPHAGOGASTRODUODENOSCOPY N/A 07/12/2017   Procedure: ESOPHAGOGASTRODUODENOSCOPY (EGD);  Surgeon: Jerene Bears, MD;  Location: [facility];  Service: Gastroenterology;  Laterality: N/A;  VAD pt so VAD team will need to accompany pt.   [name] FETAL SURGERY FOR CONGENITAL HERNIA     ????? pt has no knowledge of this..    . FISTULOGRAM Left 08/09/2018   Procedure: FISTULOGRAM LEFT ARM;  Surgeon: Waynetta Sandy, MD;  Location: [facility];  Service: Vascular;  Laterality:  Left;  . INGUINAL HERNIA REPAIR    . INSERTION OF DIALYSIS CATHETER Right 06/26/2017   Procedure: INSERTION OF TUNNELED  DIALYSIS CATHETER RIGHT INTERNAL JUGULAR;  Surgeon: Conrad Bassett, MD;  Location: [facility];  Service: Vascular;  Laterality: Right;  . INSERTION OF IMPLANTABLE LEFT VENTRICULAR ASSIST DEVICE N/A 06/12/2017   Procedure: INSERTION OF IMPLANTABLE LEFT VENTRICULAR ASSIST [place] 3;  Surgeon: Ivin Poot, MD;  Location: [facility];  Service: Open Heart Surgery;  Laterality: N/A;  HM3 LVAD  CIRC ARREST  NITRIC OXIDE  . INSERTION OF IMPLANTABLE LEFT VENTRICULAR ASSIST DEVICE N/A 02/28/2018   Procedure: REDO INSERTION OF IMPLANTABLE LEFT VENTRICULAR ASSIST DEVICE - HEARTMATE 3;  Surgeon: Ivin Poot, MD;  Location: [facility];  Service: Open Heart Surgery;  Laterality: N/A;  . IR REMOVAL TUN CV CATH W/O FL  02/26/2018  . IR THORACENTESIS ASP PLEURAL SPACE W/IMG GUIDE  06/28/2017  . LIGATION OF ARTERIOVENOUS  FISTULA Right 10/31/2017   Procedure: LIGATION OF BRACHIO-BASILIC VEIN TRANSPOSITION RIGHT ARM;  Surgeon: Conrad [place], MD;  Location: [facility];  Service: Vascular;  Laterality: Right;  . MULTIPLE EXTRACTIONS WITH ALVEOLOPLASTY N/A 06/08/2017   Procedure: MULTIPLE EXTRACTION WITH ALVEOLOPLASTY AND PRE PROSTHETIC SURGERY AS NEEDED;  Surgeon: Lenn Cal, DDS;  Location: [facility];  Service: Oral Surgery;  Laterality: N/A;  . RIGHT HEART CATH N/A 06/07/2017   Procedure: RIGHT HEART CATH;  Surgeon: Larey Dresser, MD;  Location: [facility];  Service: Cardiovascular;  Laterality: N/A;  . RIGHT HEART CATH N/A 02/08/2018   Procedure: RIGHT HEART CATH;  Surgeon: Larey Dresser, MD;  Location: [facility];  Service: Cardiovascular;  Laterality: N/A;  . RIGHT HEART CATH N/A 08/09/2018   Procedure: RIGHT HEART CATH;  Surgeon: Larey Dresser, MD;  Location: [facility];  Service: Cardiovascular;  Laterality: N/A;  . RIGHT/LEFT HEART CATH AND CORONARY ANGIOGRAPHY  N/A 11/23/2016   Procedure: Right/Left Heart Cath and Coronary Angiography;  Surgeon: Larey Dresser, MD;  Location: [facility];  Service: Cardiovascular;  Laterality: N/A;  . TEE WITHOUT CARDIOVERSION N/A 06/12/2017   Procedure: TRANSESOPHAGEAL ECHOCARDIOGRAM (TEE);  Surgeon: Prescott Gum, Collier Salina, MD;  Location: [facility];  Service: Open Heart Surgery;  Laterality: N/A;  . TEE WITHOUT CARDIOVERSION N/A 02/28/2018   Procedure: TRANSESOPHAGEAL ECHOCARDIOGRAM (TEE);  Surgeon: Prescott Gum, Collier Salina, MD;  Location: [facility];  Service: Open Heart Surgery;  Laterality: N/A;  . TEMPORARY DIALYSIS CATHETER Left 05/24/2018   Procedure: TEMPORARY DIALYSIS CATHETER;  Surgeon: Marty Heck, MD;  Location: [facility];  Service: Cardiovascular;  Laterality: Left;  . THROMBECTOMY AND REVISION OF ARTERIOVENTOUS (AV) GORETEX  GRAFT Right 12/12/2017   Procedure: THROMBECTOMY ARTERIOVENTOUS (AV) GORETEX  GRAFT RIGHT UPPER ARM;  Surgeon: Conrad Scotia, MD;  Location: [facility];  Service: Vascular;  Laterality: Right;    There were no vitals filed for this  visit.  Subjective Assessment - 10/09/18 0854    Subjective   Pt reports left arm and hand pain    Pertinent History  .PMH: ischemic cardimyopathy, LVAD 10/18, ESRD, dialysis M-W-F, 9/19 fistula placement, graft placement in LUE 10/19, catheter in left chest,  DMII    Patient Stated Goals  regain use of LUE    Currently in Pain?  Yes    Pain Score  3     Pain Location  Hand    Pain Orientation  Left    Pain Descriptors / Indicators  Aching    Pain Type  Chronic pain    Pain Onset  More than a month ago    Aggravating Factors   movement    Pain Relieving Factors  heat    Multiple Pain Sites  No           Treatment: Fluidotherapy x 10 mins to LUE, for stiffness and pain, no adverse reactions. Pt was instructed in initial HEP, see pt instructions.                   OT Short Term Goals - 10/04/18 1759      OT SHORT TERM GOAL #1    Title  I with HEP    Time  6    Period  Weeks    Status  New    Target Date  11/18/18      OT SHORT TERM GOAL #2   Title  Pt will demonstrate 90% composite finger flexion for LUE for increased functional use.    Baseline  grossly 75-80%    Time  6    Period  Weeks    Status  New      OT SHORT TERM GOAL #3   Title  Pt will increase LUE grip strength to 15 lbs for increased functional use during ADLS.    Baseline  RUE 50, LUE 5    Time  6    Period  Weeks    Status  New      OT SHORT TERM GOAL #4   Title  Pt will dress himself modified independently.    Time  6    Period  Weeks    Status  New      OT SHORT TERM GOAL #5   Title  Pt will demonstrate ability to open a bottle and fasten buttons modified indepdently using AE prn.    Time  6    Period  Weeks    Status  New        OT Long Term Goals - 10/04/18 1802      OT LONG TERM GOAL #1   Title  Pt will demonstrate increased LUE functional use in order to change the battery in his LVAD modified independently.    Time  12    Period  Weeks    Status  New    Target Date  01/02/19      OT LONG TERM GOAL #2   Title  Pt will demonstrate adequate LUE strength and endurance for pt to make his bed, fold laundry and wash dishes modified independently.    Time  12    Period  Weeks    Status  New      OT LONG TERM GOAL #3   Title  Pt demonstrate improved LUE functional use as evidenced by increasing box/ blocks score to 35 blocks or greater.     Baseline  RUE 60,  LUE 28     Time  12    Period  Weeks    Status  New      OT LONG TERM GOAL #4   Title  Pt will verbalize understanding of adpated strategies/ AE to maximize I with ADLs.    Time  12    Period  Weeks    Status  New            Plan - 10/09/18 0813    Clinical Impression Statement  Pt  is progressing slowly towards goals limited by pain and stiffness.    Occupational Profile and client history currently impacting functional performance  Prior to placement  of graft in LUE pt reports he was I with ADLs and driving.PMH: ischemic cardimyopathy, LVAD 10/18, ESRD, dialysis M-W-F, 9/19 fistula placement, graft placement in LUE 10/19, catheter in left chest,  DMII    Occupational performance deficits (Please refer to evaluation for details):  IADL's;ADL's;Play;Leisure;Social Participation    Rehab Potential  Fair    Current Impairments/barriers affecting progress:  severity of deficits, complex medical hx, pain    OT Frequency  2x / week    OT Duration  12 weeks    OT Treatment/Interventions  Self-care/ADL training;Therapeutic exercise;Patient/family education;Neuromuscular education;Moist Heat;Paraffin;Fluidtherapy;Energy conservation;Therapeutic activities;Passive range of motion;Manual Therapy;DME and/or AE instruction;Contrast Bath;Cryotherapy    Plan  A/ROM, P/ROM, continue fluido    Clinical Decision Making  Limited treatment options, no task modification necessary    Consulted and Agree with Plan of Care  Patient       Patient will benefit from skilled therapeutic intervention in order to improve the following deficits and impairments:  Increased edema, Impaired flexibility, Impaired sensation, Pain, Decreased coordination, Decreased activity tolerance, Decreased endurance, Decreased range of motion, Decreased strength, Impaired UE functional use, Impaired perceived functional ability, Decreased safety awareness, Difficulty walking, Decreased knowledge of precautions, Decreased balance  Visit Diagnosis: Muscle weakness (generalized)  Pain in left hand  Stiffness of left hand, not elsewhere classified  Other disturbances of skin sensation    Problem List Patient Active Problem List   Diagnosis Date Noted  . Subtherapeutic international normalized ratio (INR) 08/28/2018  . Elevated LDH 08/22/2018  . Acute vomiting   . Nausea & vomiting 06/11/2018  . Heart failure ([place]) 05/23/2018  . Hypertensive urgency 03/25/2018  . Benign essential  HTN   . Crohn's disease with complication ([place])   . Diabetes mellitus type 2 in nonobese (HCC)   . Anemia of chronic disease   . Sinus tachycardia   . Leukocytosis   . Left ventricular assist device (LVAD) complication 91/50/5697  . Left ventricular assist device (LVAD) complication, initial encounter 02/28/2018  . Factor V Leiden carrier ([place]) 01/10/2018  . History of creation of ostomy ([place]) 01/10/2018  . Hypercoagulable state ([place]) 01/10/2018  . ESRD (end stage renal disease) ([place]) 12/11/2017  . Left ventricular assist device present ([place]) 12/05/2017  . ESRD (end stage renal disease) on dialysis ([place]) 10/29/2017  . Nausea with vomiting 09/12/2017  . Intractable nausea and vomiting 07/31/2017  . 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) ([place]) 07/10/2017  . LVAD (left ventricular assist device) present ([place])   . Palliative care by specialist   . Chronic systolic CHF (congestive heart failure) ([place]) 06/07/2017  . ACP (advance care planning)   . Encounter for central line placement   . Diarrhea   . Palliative care encounter   . Nausea   . CHF exacerbation ([place]) 04/05/2017  . Diabetic  keto-acidosis ([place]) 04/05/2017  . Type 2 diabetes mellitus with complication, with long-term current use of insulin ([place]) 03/08/2017  . ESRD on dialysis ([place]) 01/17/2017  . AKI (acute kidney injury) ([place])   . Non-ST elevation (NSTEMI) myocardial infarction ([place])   . CHF (congestive heart failure) ([place]) 09/02/2016  . Elevated serum creatinine   . Encounter for therapeutic drug monitoring 08/03/2016  . Chest pain 04/26/2012  . SVT (supraventricular tachycardia) ([place]) 04/26/2012  . Gastroparesis 11/18/2011  . Nausea and vomiting in adult 11/17/2011  . Coronary artery disease   . Ischemic cardiomyopathy   . Essential hypertension   . Automatic implantable cardioverter-defibrillator in situ   . Deep venous thrombosis ([place])   . Hypersomnolent   . POLYP, COLON 09/25/2008  . Type 2 diabetes  mellitus ([place]) 09/25/2008  . Dyslipidemia, goal LDL below 70 09/25/2008  . GASTRITIS 09/25/2008  . DIVERTICULOSIS, COLON 09/25/2008  . RECTAL MASS 09/25/2008  . History of Crohn's disease 09/05/2006    RINE,KATHRYN 10/09/2018, 8:55 AM Theone Murdoch, OTR/L [phone] Phone: [phone] 9:03 AM 10/09/18 [facility] [address] [place], [place], [address] Phone: [phone]   Fax:  [phone]  Name: MUSTAPHA COLSON MRN: [id number] Date of Birth: [birth year]

## 2018-10-11 LAB — POCT INR: INR: 2 (ref 2–3)

## 2018-10-12 ENCOUNTER — Ambulatory Visit (HOSPITAL_COMMUNITY): Payer: Self-pay | Admitting: Pharmacist

## 2018-10-12 DIAGNOSIS — Z5181 Encounter for therapeutic drug level monitoring: Secondary | ICD-10-CM

## 2018-10-12 DIAGNOSIS — Z95811 Presence of heart assist device: Secondary | ICD-10-CM

## 2018-10-14 ENCOUNTER — Encounter: Payer: Self-pay | Admitting: Podiatry

## 2018-10-14 NOTE — Progress Notes (Signed)
Subjective: [name] is a 53 y.o. y.o. male who presents today with painful, discolored, thick toenails  which interfere with daily activities. Pain is aggravated when wearing enclosed shoe gear. Pain is relieved with periodic professional debridement.  Patient is on Coumadin.  Patient's PCP is Lovenia Kim, MD.  Mr. Palmieri voices no new pedal concerns on today's visit.   Current Outpatient Medications:  .  amiodarone (PACERONE) 200 MG tablet, Take 200 mg by mouth daily., Disp: , Rfl:  .  aspirin EC 81 MG tablet, Take 1 tablet (81 mg total) by mouth daily., Disp: 90 tablet, Rfl: 3 .  atorvastatin (LIPITOR) 40 MG tablet, Take 1 tablet (40 mg total) by mouth daily at 6 PM., Disp: 30 tablet, Rfl: 6 .  calcitRIOL (ROCALTROL) 0.5 MCG capsule, Take 1 capsule (0.5 mcg total) by mouth every Monday, Wednesday, and Friday., Disp: 15 capsule, Rfl: 5 .  citalopram (CELEXA) 20 MG tablet, Take 1 tablet (20 mg total) by mouth daily., Disp: 30 tablet, Rfl: 6 .  docusate sodium (COLACE) 100 MG capsule, Take 100 mg by mouth daily as needed for mild constipation., Disp: , Rfl:  .  dronabinol (MARINOL) 2.5 MG capsule, Take 1 capsule (2.5 mg total) by mouth 2 (two) times daily before lunch and supper., Disp: 60 capsule, Rfl: 5 .  erythromycin (E-MYCIN) 250 MG tablet, Take 1 tablet (250 mg total) by mouth 3 (three) times daily., Disp: 90 tablet, Rfl: 4 .  insulin aspart (NOVOLOG) 100 UNIT/ML injection, Inject 5 Units into the skin 3 (three) times daily with meals., Disp: 1 vial, Rfl: 0 .  insulin glargine (LANTUS) 100 UNIT/ML injection, Inject 0.1 mLs (10 Units total) into the skin daily., Disp: 10 mL, Rfl: 5 .  isosorbide-hydrALAZINE (BIDIL) 20-37.5 MG tablet, Take 1.5 tablets by mouth every 8 (eight) hours. Take 1.5 tablets every 8 hours on non dialysis days. (Patient taking differently: Take 1 tablet by mouth daily. ), Disp: 135 tablet, Rfl: 6 .  lidocaine-prilocaine (EMLA) cream, Apply 1 application  topically as needed (to numb port)., Disp: , Rfl:  .  LORazepam (ATIVAN) 0.5 MG tablet, Take 1 tablet (0.5 mg total) by mouth every 12 (twelve) hours as needed (nausea)., Disp: 60 tablet, Rfl: 2 .  metoCLOPramide (REGLAN) 10 MG tablet, Take 1 tablet (10 mg total) by mouth 3 (three) times daily., Disp: 90 tablet, Rfl: 6 .  multivitamin (RENA-VIT) TABS tablet, Take 1 tablet by mouth daily., Disp: , Rfl:  .  pantoprazole (PROTONIX) 40 MG tablet, Take 40 mg by mouth 2 (two) times daily. , Disp: , Rfl:  .  promethazine (PHENERGAN) 12.5 MG tablet, Take 1 tablet (12.5 mg total) by mouth every 6 (six) hours as needed for nausea or vomiting. (Patient not taking: Reported on 09/13/2018), Disp: 30 tablet, Rfl: 0 .  sevelamer carbonate (RENVELA) 800 MG tablet, Take 1,600-4,000 mg by mouth See admin instructions. 4088m three times daily with meals and 16040mtwice daily with snacks, Disp: , Rfl:  .  traMADol (ULTRAM) 50 MG tablet, Take 1 tablet (50 mg total) by mouth every 6 (six) hours as needed for moderate pain. (Patient not taking: Reported on 09/13/2018), Disp: 30 tablet, Rfl: 0 .  warfarin (COUMADIN) 5 MG tablet, TAKE AS FOLLOWS: MON-WED-FRI 1&1/2 TABLETS DAILY. ON ALL OTHER DAYS 1 TABLET (Patient taking differently: TAKE AS FOLLOWS: Tue and FRI 1&1/2 TABLETS DAILY. ON ALL OTHER DAYS 1 TABLET), Disp: 50 tablet, Rfl: 6   Allergies  Allergen Reactions  .  Metformin And Related Diarrhea     Objective: Vascular Examination: Capillary refill time immediate x 10 digits Dorsalis pedis pulses palpable b/l   Posterior tibial pulses palpable b/l No digital hair x 10 digits Skin temperature gradient WNL b/l  Dermatological Examination: Pedal skin atrophic b/l LE  Toenails 1-5 b/l discolored, thick, dystrophic with subungual debris and pain with palpation to nailbeds due to thickness of nails.  Musculoskeletal: Muscle strength 5/5 to all LE muscle groups  Neurological: Sensation intact with 10 gram  monofilament.  Vibratory sensation intact.  Assessment: Painful onychomycosis toenails 1-5 b/l in patient on blood thinner NIDDM with ESRD  Plan: 1. Toenails 1-5 b/l were debrided in length and girth without iatrogenic bleeding. 2. Patient to continue soft, supportive shoe gear 3. Patient to report any pedal injuries to medical professional immediately. 4. Avoid self trimming due to use of blood thinner. 5. Follow up 3 months.  6. Patient/POA to call should there be a concern in the interim.

## 2018-10-15 ENCOUNTER — Encounter: Payer: Self-pay | Admitting: Cardiology

## 2018-10-16 ENCOUNTER — Ambulatory Visit: Payer: BLUE CROSS/BLUE SHIELD | Admitting: Occupational Therapy

## 2018-10-16 ENCOUNTER — Other Ambulatory Visit (HOSPITAL_COMMUNITY): Payer: Self-pay | Admitting: Cardiology

## 2018-10-16 DIAGNOSIS — M25642 Stiffness of left hand, not elsewhere classified: Secondary | ICD-10-CM

## 2018-10-16 DIAGNOSIS — M79642 Pain in left hand: Secondary | ICD-10-CM

## 2018-10-16 DIAGNOSIS — R208 Other disturbances of skin sensation: Secondary | ICD-10-CM

## 2018-10-16 DIAGNOSIS — M6281 Muscle weakness (generalized): Secondary | ICD-10-CM | POA: Diagnosis not present

## 2018-10-16 NOTE — Therapy (Signed)
[facility] [address] [place], [place], [address] Phone: [phone]   Fax:  [phone]  Occupational Therapy Treatment  Patient Details  Name: BRANNAN CASSEDY MRN: [id number] Date of Birth: 01-25-1966 Referring Provider (OT): Dr. Marigene Ehlers   Encounter Date: 10/16/2018  OT End of Session - 10/16/18 0810    Visit Number  3    Number of Visits  25    Authorization Type  [employer]    OT Start Time  0803    OT Stop Time  0843    OT Time Calculation (min)  40 min    Activity Tolerance  Patient tolerated treatment well    Behavior During Therapy  [facility] for tasks assessed/performed       Past Medical History:  Diagnosis Date  . Angina   . ASCVD (arteriosclerotic cardiovascular disease)    , Anterior infarction 2005, LAD diagonal bifurcation intervention 03/2004  . Automatic implantable cardiac defibrillator -St. Jude's       . Benign neoplasm of colon   . CHF (congestive heart failure) ([place])   . Chronic systolic heart failure ([place])   . Coronary artery disease     Widely patent previously placed stents in the left anterior   . Crohn's disease ([place])   . 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 Dialysis patient ([place])   . Dyspnea   . Gastroparesis   . GERD (gastroesophageal reflux disease)   . High cholesterol   . Hyperlipidemia   . Hypersomnolent    Previous diagnosis of narcolepsy  . Hypertension, essential   . Ischemic cardiomyopathy    Ejection fraction 15-20% catheterization 2010  . Type II or unspecified type diabetes mellitus without mention of complication, not stated as uncontrolled   . Unspecified gastritis and gastroduodenitis without mention of hemorrhage     Past Surgical History:  Procedure Laterality Date  . A/V SHUNTOGRAM N/A 05/24/2018   Procedure: A/V SHUNTOGRAM;  Surgeon: Marty Heck, MD;  Location: [facility];  Service: Cardiovascular;  Laterality: N/A;  .  APPENDECTOMY    . AV FISTULA PLACEMENT Right 06/26/2017   Procedure: ARTERIOVENOUS (AV) FISTULA CREATION VERSUS GRAFT RIGHT  ARM;  Surgeon: Conrad Strawberry, MD;  Location: [facility];  Service: Vascular;  Laterality: Right;  . AV FISTULA PLACEMENT Right 10/31/2017   Procedure: INSERTION OF ARTERIOVENOUS (AV) ARTEGRAFT,  RIGHT UPPER ARM;  Surgeon: Conrad [place], MD;  Location: [facility];  Service: Vascular;  Laterality: Right;  . AV FISTULA PLACEMENT Left 05/29/2018   Procedure: ARTERIOVENOUS (AV) FISTULA CREATION  LEFT UPPER EXTREMITY;  Surgeon: Waynetta Sandy, MD;  Location: [facility];  Service: Vascular;  Laterality: Left;  . AV FISTULA PLACEMENT Left 08/09/2018   Procedure: INSERTION OF ARTERIOVENOUS (AV) GORE-TEX GRAFT LEFT UPPER ARM;  Surgeon: Waynetta Sandy, MD;  Location: [facility];  Service: Vascular;  Laterality: Left;  . CARDIAC DEFIBRILLATOR PLACEMENT  2010   St. Jude ICD  . CENTRAL LINE INSERTION Right 05/24/2018   Procedure: CENTRAL LINE INSERTION;  Surgeon: Marty Heck, MD;  Location: [facility];  Service: Cardiovascular;  Laterality: Right;  . COLECTOMY  ~ 2003   "for Crohn's" ascending colon.    . CORONARY STENT INTERVENTION N/A 11/25/2016   Procedure: Coronary Stent Intervention;  Surgeon: Leonie Man, MD;  Location: [facility];  Service: Cardiovascular;  Laterality: N/A;  . EP IMPLANTABLE DEVICE N/A 09/14/2016   Procedure:  ICD Generator Changeout;  Surgeon: Deboraha Sprang, MD;  Location: [facility];  Service: Cardiovascular;  Laterality: N/A;  . ESOPHAGOGASTRODUODENOSCOPY N/A 07/12/2017   Procedure: ESOPHAGOGASTRODUODENOSCOPY (EGD);  Surgeon: Jerene Bears, MD;  Location: [facility];  Service: Gastroenterology;  Laterality: N/A;  VAD pt so VAD team will need to accompany pt.   [name] FETAL SURGERY FOR CONGENITAL HERNIA     ????? pt has no knowledge of this..    . FISTULOGRAM Left 08/09/2018   Procedure: FISTULOGRAM LEFT ARM;  Surgeon: Waynetta Sandy, MD;  Location: [facility];  Service: Vascular;  Laterality: Left;  . INGUINAL HERNIA REPAIR    . INSERTION OF DIALYSIS CATHETER Right 06/26/2017   Procedure: INSERTION OF TUNNELED  DIALYSIS CATHETER RIGHT INTERNAL JUGULAR;  Surgeon: Conrad Delta, MD;  Location: [facility];  Service: Vascular;  Laterality: Right;  . INSERTION OF IMPLANTABLE LEFT VENTRICULAR ASSIST DEVICE N/A 06/12/2017   Procedure: INSERTION OF IMPLANTABLE LEFT VENTRICULAR ASSIST [place] 3;  Surgeon: Ivin Poot, MD;  Location: [facility];  Service: Open Heart Surgery;  Laterality: N/A;  HM3 LVAD  CIRC ARREST  NITRIC OXIDE  . INSERTION OF IMPLANTABLE LEFT VENTRICULAR ASSIST DEVICE N/A 02/28/2018   Procedure: REDO INSERTION OF IMPLANTABLE LEFT VENTRICULAR ASSIST DEVICE - HEARTMATE 3;  Surgeon: Ivin Poot, MD;  Location: [facility];  Service: Open Heart Surgery;  Laterality: N/A;  . IR REMOVAL TUN CV CATH W/O FL  02/26/2018  . IR THORACENTESIS ASP PLEURAL SPACE W/IMG GUIDE  06/28/2017  . LIGATION OF ARTERIOVENOUS  FISTULA Right 10/31/2017   Procedure: LIGATION OF BRACHIO-BASILIC VEIN TRANSPOSITION RIGHT ARM;  Surgeon: Conrad Gary, MD;  Location: [facility];  Service: Vascular;  Laterality: Right;  . MULTIPLE EXTRACTIONS WITH ALVEOLOPLASTY N/A 06/08/2017   Procedure: MULTIPLE EXTRACTION WITH ALVEOLOPLASTY AND PRE PROSTHETIC SURGERY AS NEEDED;  Surgeon: Lenn Cal, DDS;  Location: [facility];  Service: Oral Surgery;  Laterality: N/A;  . RIGHT HEART CATH N/A 06/07/2017   Procedure: RIGHT HEART CATH;  Surgeon: Larey Dresser, MD;  Location: [facility];  Service: Cardiovascular;  Laterality: N/A;  . RIGHT HEART CATH N/A 02/08/2018   Procedure: RIGHT HEART CATH;  Surgeon: Larey Dresser, MD;  Location: [facility];  Service: Cardiovascular;  Laterality: N/A;  . RIGHT HEART CATH N/A 08/09/2018   Procedure: RIGHT HEART CATH;  Surgeon: Larey Dresser, MD;  Location: [facility];  Service:  Cardiovascular;  Laterality: N/A;  . RIGHT/LEFT HEART CATH AND CORONARY ANGIOGRAPHY N/A 11/23/2016   Procedure: Right/Left Heart Cath and Coronary Angiography;  Surgeon: Larey Dresser, MD;  Location: [facility];  Service: Cardiovascular;  Laterality: N/A;  . TEE WITHOUT CARDIOVERSION N/A 06/12/2017   Procedure: TRANSESOPHAGEAL ECHOCARDIOGRAM (TEE);  Surgeon: Prescott Gum, Collier Salina, MD;  Location: [name];  Service: Open Heart Surgery;  Laterality: N/A;  . TEE WITHOUT CARDIOVERSION N/A 02/28/2018   Procedure: TRANSESOPHAGEAL ECHOCARDIOGRAM (TEE);  Surgeon: Prescott Gum, Collier Salina, MD;  Location: [facility];  Service: Open Heart Surgery;  Laterality: N/A;  . TEMPORARY DIALYSIS CATHETER Left 05/24/2018   Procedure: TEMPORARY DIALYSIS CATHETER;  Surgeon: Marty Heck, MD;  Location: [facility];  Service: Cardiovascular;  Laterality: Left;  . THROMBECTOMY AND REVISION OF ARTERIOVENTOUS (AV) GORETEX  GRAFT Right 12/12/2017   Procedure: THROMBECTOMY ARTERIOVENTOUS (AV) GORETEX  GRAFT RIGHT UPPER ARM;  Surgeon: Conrad Quinhagak, MD;  Location: [facility];  Service: Vascular;  Laterality: Right;    There were no vitals filed for this visit.  Subjective Assessment - 10/16/18 0809    Subjective   Pt reports using his hand as he can    Pertinent History  .PMH: ischemic cardimyopathy, LVAD 10/18, ESRD, dialysis M-W-F, 9/19 fistula placement, graft placement in LUE 10/19, catheter in left chest,  DMII    Limitations  no estim!    Patient Stated Goals  regain use of LUE    Currently in Pain?  Yes    Pain Score  2     Pain Location  Hand    Pain Orientation  Left    Pain Descriptors / Indicators  Aching    Pain Type  Chronic pain    Pain Onset  More than a month ago    Pain Frequency  Intermittent    Aggravating Factors   movement    Pain Relieving Factors  heat              Fluidotherapy x 10 mins to LUE, for stiffness and pain, no adverse reactions. A/ROM composite finger flexion, hook fist AA/ROM  for index finger 10-15 reps each, min v.c Crumpling washcloth into hand then pressing out on tabletop 10 reps each, min v.c Yellow putty for composite finger grip then rolling out with LUE and perfroming tip pinch, 3 pt pinch mod v.c/ demo Yellow clothespins for 3 pt pinch/ tip pinch, mod-max difficulty, mod v.c                                              OT Short Term Goals - 10/04/18 1759      OT SHORT TERM GOAL #1   Title  I with HEP    Time  6    Period  Weeks    Status  New    Target Date  11/18/18      OT SHORT TERM GOAL #2   Title  Pt will demonstrate 90% composite finger flexion for LUE for increased functional use.    Baseline  grossly 75-80%    Time  6    Period  Weeks    Status  New      OT SHORT TERM GOAL #3   Title  Pt will increase LUE grip strength to 15 lbs for increased functional use during ADLS.    Baseline  RUE 50, LUE 5    Time  6    Period  Weeks    Status  New      OT SHORT TERM GOAL #4   Title  Pt will dress himself modified independently.    Time  6    Period  Weeks    Status  New      OT SHORT TERM GOAL #5   Title  Pt will demonstrate ability to open a bottle and fasten buttons modified indepdently using AE prn.    Time  6    Period  Weeks    Status  New        OT Long Term Goals - 10/04/18 1802      OT LONG TERM GOAL #1   Title  Pt will demonstrate increased LUE functional use in order to change the battery in his LVAD modified independently.    Time  12    Period  Weeks    Status  New    Target Date  01/02/19      OT LONG TERM GOAL #2   Title  Pt will demonstrate adequate LUE strength and endurance for pt to make his bed, fold laundry and wash dishes modified independently.    Time  12    Period  Weeks    Status  New      OT LONG TERM GOAL #3   Title  Pt demonstrate improved LUE functional use as evidenced by increasing box/ blocks score to 35 blocks or greater.     Baseline  RUE 60,  LUE 28     Time  12    Period  Weeks    Status  New      OT LONG TERM GOAL #4   Title  Pt will verbalize understanding of adpated strategies/ AE to maximize I with ADLs.    Time  12    Period  Weeks    Status  New              Patient will benefit from skilled therapeutic intervention in order to improve the following deficits and impairments:     Visit Diagnosis: Muscle weakness (generalized)  Pain in left hand  Stiffness of left hand, not elsewhere classified  Other disturbances of skin sensation    Problem List Patient Active Problem List   Diagnosis Date Noted  . Subtherapeutic international normalized ratio (INR) 08/28/2018  . Elevated LDH 08/22/2018  . Acute vomiting   . Nausea & vomiting 06/11/2018  . Heart failure ([place]) 05/23/2018  . Hypertensive urgency 03/25/2018  . Benign essential HTN   . Crohn's disease with complication ([place])   . Diabetes mellitus type 2 in nonobese (HCC)   . Anemia of chronic disease   . Sinus tachycardia   . Leukocytosis   . Left ventricular assist device (LVAD) complication 86/75/4492  . Left ventricular assist device (LVAD) complication, initial encounter 02/28/2018  . Factor V Leiden carrier ([place]) 01/10/2018  . History of creation of ostomy ([place]) 01/10/2018  . Hypercoagulable state ([place]) 01/10/2018  . ESRD (end stage renal disease) ([place]) 12/11/2017  . Left ventricular assist device present ([place]) 12/05/2017  . ESRD (end stage renal disease) on dialysis ([place]) 10/29/2017  . Nausea with vomiting 09/12/2017  . Intractable nausea and vomiting 07/31/2017  . 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) ([place]) 07/10/2017  . LVAD (left ventricular assist device) present ([place])   . Palliative care by specialist   . Chronic systolic CHF (congestive heart failure) ([place]) 06/07/2017  . ACP (advance care planning)   . Encounter for central line placement   . Diarrhea   . Palliative care encounter   . Nausea   . CHF exacerbation  ([place]) 04/05/2017  . Diabetic keto-acidosis ([place]) 04/05/2017  . Type 2 diabetes mellitus with complication, with long-term current use of insulin ([place]) 03/08/2017  . ESRD on dialysis ([place]) 01/17/2017  . AKI (acute kidney injury) ([place])   . Non-ST elevation (NSTEMI) myocardial infarction ([place])   . CHF (congestive heart failure) ([place]) 09/02/2016  . Elevated serum creatinine   . Encounter for therapeutic drug monitoring 08/03/2016  . Chest pain 04/26/2012  . SVT (supraventricular tachycardia) ([place]) 04/26/2012  . Gastroparesis 11/18/2011  . Nausea and vomiting in adult 11/17/2011  . Coronary artery disease   . Ischemic cardiomyopathy   . Essential hypertension   . Automatic implantable cardioverter-defibrillator in situ   . Deep venous thrombosis ([place])   . Hypersomnolent   .  POLYP, COLON 09/25/2008  . Type 2 diabetes mellitus ([place]) 09/25/2008  . Dyslipidemia, goal LDL below 70 09/25/2008  . GASTRITIS 09/25/2008  . DIVERTICULOSIS, COLON 09/25/2008  . RECTAL MASS 09/25/2008  . History of Crohn's disease 09/05/2006    Keone Kamer 10/16/2018, 8:37 AM  [facility] [address] [place], [place], [address] Phone: [phone]   Fax:  [phone]  Name: ALDAIR RICKEL MRN: [id number] Date of Birth: [birth year]

## 2018-10-18 ENCOUNTER — Ambulatory Visit: Payer: BLUE CROSS/BLUE SHIELD | Admitting: Occupational Therapy

## 2018-10-19 ENCOUNTER — Ambulatory Visit (HOSPITAL_COMMUNITY): Payer: Self-pay | Admitting: Pharmacist

## 2018-10-19 DIAGNOSIS — Z95811 Presence of heart assist device: Secondary | ICD-10-CM

## 2018-10-19 DIAGNOSIS — Z5181 Encounter for therapeutic drug level monitoring: Secondary | ICD-10-CM

## 2018-10-19 LAB — POCT INR: INR: 2.2 (ref 2.0–3.0)

## 2018-10-23 ENCOUNTER — Ambulatory Visit: Payer: BLUE CROSS/BLUE SHIELD | Admitting: Occupational Therapy

## 2018-10-23 ENCOUNTER — Other Ambulatory Visit (HOSPITAL_COMMUNITY): Payer: Self-pay | Admitting: *Deleted

## 2018-10-23 ENCOUNTER — Other Ambulatory Visit (HOSPITAL_COMMUNITY): Payer: Self-pay | Admitting: Cardiology

## 2018-10-23 DIAGNOSIS — R110 Nausea: Secondary | ICD-10-CM

## 2018-10-23 MED ORDER — PROMETHAZINE HCL 12.5 MG PO TABS: 12.5000 mg | ORAL_TABLET | Freq: Four times a day (QID) | ORAL | 3 refills | Status: DC | PRN

## 2018-10-24 ENCOUNTER — Telehealth (HOSPITAL_COMMUNITY): Payer: Self-pay | Admitting: *Deleted

## 2018-10-25 ENCOUNTER — Ambulatory Visit: Payer: BLUE CROSS/BLUE SHIELD | Admitting: Occupational Therapy

## 2018-10-25 ENCOUNTER — Ambulatory Visit (HOSPITAL_COMMUNITY): Payer: Self-pay | Admitting: Pharmacist

## 2018-10-25 DIAGNOSIS — M6281 Muscle weakness (generalized): Secondary | ICD-10-CM | POA: Diagnosis not present

## 2018-10-25 DIAGNOSIS — Z95811 Presence of heart assist device: Secondary | ICD-10-CM

## 2018-10-25 DIAGNOSIS — M25642 Stiffness of left hand, not elsewhere classified: Secondary | ICD-10-CM

## 2018-10-25 DIAGNOSIS — Z5181 Encounter for therapeutic drug level monitoring: Secondary | ICD-10-CM

## 2018-10-25 LAB — POCT INR: INR: 3 (ref 2–3)

## 2018-10-25 NOTE — Patient Instructions (Signed)
  IP Flexion (Active Blocked)    Brace thumb below tip joint. Bend joint as far as possible. Repeat _10___ times. Do __4__ sessions per day.  Opposition (Active)    Touch tip of thumb to nail tip of each finger in turn, making an "O" shape. Repeat _10___ times. Do _4___ sessions per day.  Palmar Adduction/Abduction (Active)    Move thumb down, away from palm (to front of palm like an "L"). Can use small bottle to help assist. Move back to rest along palm. Repeat _10___ times. Do _4___ sessions per day.

## 2018-10-25 NOTE — Therapy (Signed)
[facility] [address] [place], [place], [address] Phone: [phone]   Fax:  [phone]  Occupational Therapy Treatment  Patient Details  Name: [name] MRN: [id number] Date of Birth: 1965-12-14 Referring Provider (OT): Dr. Marigene Ehlers   Encounter Date: 10/25/2018  OT End of Session - 10/25/18 1352    Visit Number  4    Number of Visits  25    Date for OT Re-Evaluation  01/02/19    Authorization Type  [employer]    OT Start Time  0930    OT Stop Time  1015    OT Time Calculation (min)  45 min    Activity Tolerance  Patient tolerated treatment well    Behavior During Therapy  [facility] for tasks assessed/performed       Past Medical History:  Diagnosis Date  . Angina   . ASCVD (arteriosclerotic cardiovascular disease)    , Anterior infarction 2005, LAD diagonal bifurcation intervention 03/2004  . Automatic implantable cardiac defibrillator -St. Jude's       . Benign neoplasm of colon   . CHF (congestive heart failure) ([place])   . Chronic systolic heart failure ([place])   . Coronary artery disease     Widely patent previously placed stents in the left anterior   . Crohn's disease ([place])   . 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 Dialysis patient ([place])   . Dyspnea   . Gastroparesis   . GERD (gastroesophageal reflux disease)   . High cholesterol   . Hyperlipidemia   . Hypersomnolent    Previous diagnosis of narcolepsy  . Hypertension, essential   . Ischemic cardiomyopathy    Ejection fraction 15-20% catheterization 2010  . Type II or unspecified type diabetes mellitus without mention of complication, not stated as uncontrolled   . Unspecified gastritis and gastroduodenitis without mention of hemorrhage     Past Surgical History:  Procedure Laterality Date  . A/V SHUNTOGRAM N/A 05/24/2018   Procedure: A/V SHUNTOGRAM;  Surgeon: Marty Heck, MD;  Location: [place] [facility];  Service:  Cardiovascular;  Laterality: N/A;  . APPENDECTOMY    . AV FISTULA PLACEMENT Right 06/26/2017   Procedure: ARTERIOVENOUS (AV) FISTULA CREATION VERSUS GRAFT RIGHT  ARM;  Surgeon: Conrad Katie, MD;  Location: [facility];  Service: Vascular;  Laterality: Right;  . AV FISTULA PLACEMENT Right 10/31/2017   Procedure: INSERTION OF ARTERIOVENOUS (AV) ARTEGRAFT,  RIGHT UPPER ARM;  Surgeon: Conrad Elgin, MD;  Location: [place];  Service: Vascular;  Laterality: Right;  . AV FISTULA PLACEMENT Left 05/29/2018   Procedure: ARTERIOVENOUS (AV) FISTULA CREATION  LEFT UPPER EXTREMITY;  Surgeon: Waynetta Sandy, MD;  Location: [place];  Service: Vascular;  Laterality: Left;  . AV FISTULA PLACEMENT Left 08/09/2018   Procedure: INSERTION OF ARTERIOVENOUS (AV) GORE-TEX GRAFT LEFT UPPER ARM;  Surgeon: Waynetta Sandy, MD;  Location: [place];  Service: Vascular;  Laterality: Left;  . CARDIAC DEFIBRILLATOR PLACEMENT  2010   St. Jude ICD  . CENTRAL LINE INSERTION Right 05/24/2018   Procedure: CENTRAL LINE INSERTION;  Surgeon: Marty Heck, MD;  Location: [place] [facility];  Service: Cardiovascular;  Laterality: Right;  . COLECTOMY  ~ 2003   "for Crohn's" ascending colon.    . CORONARY STENT INTERVENTION N/A 11/25/2016   Procedure: Coronary Stent Intervention;  Surgeon: Leonie Man, MD;  Location: [place] [facility];  Service: Cardiovascular;  Laterality: N/A;  .  EP IMPLANTABLE DEVICE N/A 09/14/2016   Procedure: ICD Generator Changeout;  Surgeon: Deboraha Sprang, MD;  Location: [place];  Service: Cardiovascular;  Laterality: N/A;  . ESOPHAGOGASTRODUODENOSCOPY N/A 07/12/2017   Procedure: ESOPHAGOGASTRODUODENOSCOPY (EGD);  Surgeon: Jerene Bears, MD;  Location: [facility];  Service: Gastroenterology;  Laterality: N/A;  VAD pt so VAD team will need to accompany pt.   [name] FETAL SURGERY FOR CONGENITAL HERNIA     ????? pt has no knowledge of this..    . FISTULOGRAM Left 08/09/2018   Procedure:  FISTULOGRAM LEFT ARM;  Surgeon: Waynetta Sandy, MD;  Location: [place];  Service: Vascular;  Laterality: Left;  . INGUINAL HERNIA REPAIR    . INSERTION OF DIALYSIS CATHETER Right 06/26/2017   Procedure: INSERTION OF TUNNELED  DIALYSIS CATHETER RIGHT INTERNAL JUGULAR;  Surgeon: Conrad Canjilon, MD;  Location: [place];  Service: Vascular;  Laterality: Right;  . INSERTION OF IMPLANTABLE LEFT VENTRICULAR ASSIST DEVICE N/A 06/12/2017   Procedure: INSERTION OF IMPLANTABLE LEFT VENTRICULAR ASSIST [place] 3;  Surgeon: Ivin Poot, MD;  Location: [place];  Service: Open Heart Surgery;  Laterality: N/A;  HM3 LVAD  CIRC ARREST  NITRIC OXIDE  . INSERTION OF IMPLANTABLE LEFT VENTRICULAR ASSIST DEVICE N/A 02/28/2018   Procedure: REDO INSERTION OF IMPLANTABLE LEFT VENTRICULAR ASSIST DEVICE - HEARTMATE 3;  Surgeon: Ivin Poot, MD;  Location: [place];  Service: Open Heart Surgery;  Laterality: N/A;  . IR REMOVAL TUN CV CATH W/O FL  02/26/2018  . IR THORACENTESIS ASP PLEURAL SPACE W/IMG GUIDE  06/28/2017  . LIGATION OF ARTERIOVENOUS  FISTULA Right 10/31/2017   Procedure: LIGATION OF BRACHIO-BASILIC VEIN TRANSPOSITION RIGHT ARM;  Surgeon: Conrad [place], MD;  Location: [place];  Service: Vascular;  Laterality: Right;  . MULTIPLE EXTRACTIONS WITH ALVEOLOPLASTY N/A 06/08/2017   Procedure: MULTIPLE EXTRACTION WITH ALVEOLOPLASTY AND PRE PROSTHETIC SURGERY AS NEEDED;  Surgeon: Lenn Cal, DDS;  Location: [facility];  Service: Oral Surgery;  Laterality: N/A;  . RIGHT HEART CATH N/A 06/07/2017   Procedure: RIGHT HEART CATH;  Surgeon: Larey Dresser, MD;  Location: [place];  Service: Cardiovascular;  Laterality: N/A;  . RIGHT HEART CATH N/A 02/08/2018   Procedure: RIGHT HEART CATH;  Surgeon: Larey Dresser, MD;  Location: [place] [facility];  Service: Cardiovascular;  Laterality: N/A;  . RIGHT HEART CATH N/A 08/09/2018   Procedure: RIGHT HEART CATH;  Surgeon: Larey Dresser, MD;  Location:  [place] [facility];  Service: Cardiovascular;  Laterality: N/A;  . RIGHT/LEFT HEART CATH AND CORONARY ANGIOGRAPHY N/A 11/23/2016   Procedure: Right/Left Heart Cath and Coronary Angiography;  Surgeon: Larey Dresser, MD;  Location: [place] [facility];  Service: Cardiovascular;  Laterality: N/A;  . TEE WITHOUT CARDIOVERSION N/A 06/12/2017   Procedure: TRANSESOPHAGEAL ECHOCARDIOGRAM (TEE);  Surgeon: Prescott Gum, Collier Salina, MD;  Location: [facility];  Service: Open Heart Surgery;  Laterality: N/A;  . TEE WITHOUT CARDIOVERSION N/A 02/28/2018   Procedure: TRANSESOPHAGEAL ECHOCARDIOGRAM (TEE);  Surgeon: Prescott Gum, Collier Salina, MD;  Location: [facility];  Service: Open Heart Surgery;  Laterality: N/A;  . TEMPORARY DIALYSIS CATHETER Left 05/24/2018   Procedure: TEMPORARY DIALYSIS CATHETER;  Surgeon: Marty Heck, MD;  Location: [place] [facility];  Service: Cardiovascular;  Laterality: Left;  . THROMBECTOMY AND REVISION OF ARTERIOVENTOUS (AV) GORETEX  GRAFT Right 12/12/2017   Procedure: THROMBECTOMY ARTERIOVENTOUS (AV) GORETEX  GRAFT RIGHT UPPER ARM;  Surgeon: Conrad [place], MD;  Location: [facility];  Service: Vascular;  Laterality: Right;    There were no vitals filed for this visit.  Subjective Assessment - 10/25/18 0939    Subjective   I took a tramadol before I came and so my pain is less    Pertinent History  .PMH: ischemic cardimyopathy, LVAD 10/18, ESRD, dialysis M-W-F, 9/19 fistula placement, graft placement in LUE 10/19, catheter in left chest,  DMII    Limitations  no estim!    Patient Stated Goals  regain use of LUE    Currently in Pain?  Yes    Pain Score  2     Pain Location  Hand    Pain Orientation  Left    Pain Descriptors / Indicators  Aching    Pain Type  Chronic pain    Pain Onset  More than a month ago    Pain Frequency  Intermittent    Aggravating Factors   movement    Pain Relieving Factors  heat         [facility] OT Assessment - 10/25/18 0001      Hand Function   Left Hand Grip (lbs)  10 lbs                OT Treatments/Exercises (OP) - 10/25/18 0001      Exercises   Exercises  Hand      Hand Exercises   Other Hand Exercises  A/ROM, P/ROM, and place hand hold ex's Lt hand. Gripper set at level 1 resistance to pick up 1/4 amount of blocks w/ max difficulty near end of task    Other Hand Exercises  Pt noted to have decr thumb palmer abduction for grasping, decr opposition and thumb IP flexion (as well as index PIP flex limited) - pt issued HEP for thumb to address this to improve functional grasping (see pt instructions for details)       Modalities   Modalities  Fluidotherapy      LUE Fluidotherapy   Number Minutes Fluidotherapy  10 Minutes    LUE Fluidotherapy Location  Hand;Wrist    Comments  at beginning of session to decr pain and stiffness      Splinting   Splinting  Pt issued neoprene CMC splint to provide more support and facilitate thumb palmer abduction. Pt educated in splint wear and care             OT Education - 10/25/18 1000    Education Details  thumb HEP    Person(s) Educated  Patient    Methods  Explanation;Demonstration;Handout    Comprehension  Verbalized understanding;Returned demonstration       OT Short Term Goals - 10/04/18 1759      OT SHORT TERM GOAL #1   Title  I with HEP    Time  6    Period  Weeks    Status  New    Target Date  11/18/18      OT SHORT TERM GOAL #2   Title  Pt will demonstrate 90% composite finger flexion for LUE for increased functional use.    Baseline  grossly 75-80%    Time  6    Period  Weeks    Status  New      OT SHORT TERM GOAL #3   Title  Pt will increase LUE grip strength to 15 lbs for increased functional use during ADLS.    Baseline  RUE 50, LUE 5    Time  6    Period  Weeks    Status  New      OT SHORT TERM GOAL #4   Title  Pt will dress himself modified independently.    Time  6    Period  Weeks    Status  New      OT SHORT TERM GOAL #5   Title  Pt will demonstrate ability  to open a bottle and fasten buttons modified indepdently using AE prn.    Time  6    Period  Weeks    Status  New        OT Long Term Goals - 10/04/18 1802      OT LONG TERM GOAL #1   Title  Pt will demonstrate increased LUE functional use in order to change the battery in his LVAD modified independently.    Time  12    Period  Weeks    Status  New    Target Date  01/02/19      OT LONG TERM GOAL #2   Title  Pt will demonstrate adequate LUE strength and endurance for pt to make his bed, fold laundry and wash dishes modified independently.    Time  12    Period  Weeks    Status  New      OT LONG TERM GOAL #3   Title  Pt demonstrate improved LUE functional use as evidenced by increasing box/ blocks score to 35 blocks or greater.     Baseline  RUE 60, LUE 28     Time  12    Period  Weeks    Status  New      OT LONG TERM GOAL #4   Title  Pt will verbalize understanding of adpated strategies/ AE to maximize I with ADLs.    Time  12    Period  Weeks    Status  New            Plan - 10/25/18 1353    Clinical Impression Statement  Pt progressing w/ Lt hand function and grip strength. Pt improved grip by 5 lbs. Pt limited in index PIP flex, thumb IP flex and palmer abduction    Occupational Profile and client history currently impacting functional performance  Prior to placement of graft in LUE pt reports he was I with ADLs and driving.PMH: ischemic cardimyopathy, LVAD 10/18, ESRD, dialysis M-W-F, 9/19 fistula placement, graft placement in LUE 10/19, catheter in left chest,  DMII    Occupational performance deficits (Please refer to evaluation for details):  IADL's;ADL's;Play;Leisure;Social Participation    Rehab Potential  Fair    Current Impairments/barriers affecting progress:  severity of deficits, complex medical hx, pain    OT Frequency  2x / week    OT Duration  12 weeks    OT Treatment/Interventions  Self-care/ADL training;Therapeutic exercise;Patient/family  education;Neuromuscular education;Moist Heat;Paraffin;Fluidtherapy;Energy conservation;Therapeutic activities;Passive range of motion;Manual Therapy;DME and/or AE instruction;Contrast Bath;Cryotherapy    Plan  continue fluido, Lt hand function, grip, thumb movement    Consulted and Agree with Plan of Care  Patient       Patient will benefit from skilled therapeutic intervention in order to improve the following deficits and impairments:  Increased edema, Impaired flexibility, Impaired sensation, Pain, Decreased coordination, Decreased activity tolerance, Decreased endurance, Decreased range of motion, Decreased strength, Impaired UE functional use, Impaired perceived functional ability, Decreased safety awareness, Difficulty walking, Decreased knowledge of precautions, Decreased balance  Visit Diagnosis: Muscle weakness (generalized)  Stiffness of left hand, not elsewhere classified    Problem List Patient Active Problem List   Diagnosis Date Noted  . Subtherapeutic international normalized ratio (INR) 08/28/2018  . Elevated LDH 08/22/2018  . Acute vomiting   . Nausea & vomiting 06/11/2018  . Heart failure ([place]) 05/23/2018  . Hypertensive urgency 03/25/2018  . Benign essential HTN   . Crohn's disease with complication ([place])   . Diabetes mellitus type 2 in nonobese (HCC)   . Anemia of chronic disease   . Sinus tachycardia   . Leukocytosis   . Left ventricular assist device (LVAD) complication 61/95/0932  . Left ventricular assist device (LVAD) complication, initial encounter 02/28/2018  . Factor V Leiden carrier ([place]) 01/10/2018  . History of creation of ostomy ([place]) 01/10/2018  . Hypercoagulable state ([place]) 01/10/2018  . ESRD (end stage renal disease) ([place]) 12/11/2017  . Left ventricular assist device present ([place]) 12/05/2017  . ESRD (end stage renal disease) on dialysis ([place]) 10/29/2017  . Nausea with vomiting 09/12/2017  . Intractable nausea and vomiting 07/31/2017  .  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) ([place]) 07/10/2017  . LVAD (left ventricular assist device) present ([place])   . Palliative care by specialist   . Chronic systolic CHF (congestive heart failure) ([place]) 06/07/2017  . ACP (advance care planning)   . Encounter for central line placement   . Diarrhea   . Palliative care encounter   . Nausea   . CHF exacerbation ([place]) 04/05/2017  . Diabetic keto-acidosis ([place]) 04/05/2017  . Type 2 diabetes mellitus with complication, with long-term current use of insulin ([place]) 03/08/2017  . ESRD on dialysis ([place]) 01/17/2017  . AKI (acute kidney injury) ([place])   . Non-ST elevation (NSTEMI) myocardial infarction ([place])   . CHF (congestive heart failure) ([place]) 09/02/2016  . Elevated serum creatinine   . Encounter for therapeutic drug monitoring 08/03/2016  . Chest pain 04/26/2012  . SVT (supraventricular tachycardia) ([place]) 04/26/2012  . Gastroparesis 11/18/2011  . Nausea and vomiting in adult 11/17/2011  . Coronary artery disease   . Ischemic cardiomyopathy   . Essential hypertension   . Automatic implantable cardioverter-defibrillator in situ   . Deep venous thrombosis ([place])   . Hypersomnolent   . POLYP, COLON 09/25/2008  . Type 2 diabetes mellitus ([place]) 09/25/2008  . Dyslipidemia, goal LDL below 70 09/25/2008  . GASTRITIS 09/25/2008  . DIVERTICULOSIS, COLON 09/25/2008  . RECTAL MASS 09/25/2008  . History of Crohn's disease 09/05/2006    Carey Bullocks, OTR/L 10/25/2018, 1:55 PM  [facility] [address] [place], [place], [address] Phone: [phone]   Fax:  [phone]  Name: KACEE KOREN MRN: [id number] Date of Birth: September 06, 1965

## 2018-10-25 NOTE — Telephone Encounter (Signed)
Dialysis center called VAD pager to report Low Flow alarms on pt's controller during dialysis treatment. She stopped pulling, returned 200 ccs; pt reports he "feels fine", but still having intermittent Low Flow alarms. Per Dr. Aundra Dub[place], instructed nurse to give additional 500 cc bolus and call VAD pager if alarms continue or patient becomes symptomatic. Also instructed her to have patient call VAD pager or report to ED if continued Low Flow alarms; she verbalized understanding of above.   Zada Girt RN, [facility] Coordinator [phone]

## 2018-10-27 ENCOUNTER — Other Ambulatory Visit (HOSPITAL_COMMUNITY): Payer: Self-pay | Admitting: Cardiology

## 2018-10-27 DIAGNOSIS — R110 Nausea: Secondary | ICD-10-CM

## 2018-10-27 DIAGNOSIS — K3184 Gastroparesis: Principal | ICD-10-CM

## 2018-10-27 DIAGNOSIS — E1143 Type 2 diabetes mellitus with diabetic autonomic (poly)neuropathy: Secondary | ICD-10-CM

## 2018-10-29 ENCOUNTER — Other Ambulatory Visit (HOSPITAL_COMMUNITY): Payer: Self-pay | Admitting: *Deleted

## 2018-10-30 ENCOUNTER — Ambulatory Visit: Payer: BLUE CROSS/BLUE SHIELD | Admitting: Occupational Therapy

## 2018-10-30 DIAGNOSIS — M79642 Pain in left hand: Secondary | ICD-10-CM

## 2018-10-30 DIAGNOSIS — M6281 Muscle weakness (generalized): Secondary | ICD-10-CM

## 2018-10-30 DIAGNOSIS — M25642 Stiffness of left hand, not elsewhere classified: Secondary | ICD-10-CM

## 2018-10-30 NOTE — Therapy (Signed)
[facility] [address] [place], [place], [address] Phone: [phone]   Fax:  [phone]  Occupational Therapy Treatment  Patient Details  Name: [name] MRN: [id number] Date of Birth: November 15, 1965 Referring Provider (OT): Dr. Marigene Ehlers   Encounter Date: 10/30/2018  OT End of Session - 10/30/18 1227    Visit Number  5    Number of Visits  25    Date for OT Re-Evaluation  01/02/19    Authorization Type  [employer]    OT Start Time  0845    OT Stop Time  0930    OT Time Calculation (min)  45 min    Activity Tolerance  Patient tolerated treatment well    Behavior During Therapy  [facility] for tasks assessed/performed       Past Medical History:  Diagnosis Date  . Angina   . ASCVD (arteriosclerotic cardiovascular disease)    , Anterior infarction 2005, LAD diagonal bifurcation intervention 03/2004  . Automatic implantable cardiac defibrillator -St. Jude's       . Benign neoplasm of colon   . CHF (congestive heart failure) ([place])   . Chronic systolic heart failure ([place])   . Coronary artery disease     Widely patent previously placed stents in the left anterior   . Crohn's disease ([place])   . 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 Dialysis patient ([place])   . Dyspnea   . Gastroparesis   . GERD (gastroesophageal reflux disease)   . High cholesterol   . Hyperlipidemia   . Hypersomnolent    Previous diagnosis of narcolepsy  . Hypertension, essential   . Ischemic cardiomyopathy    Ejection fraction 15-20% catheterization 2010  . Type II or unspecified type diabetes mellitus without mention of complication, not stated as uncontrolled   . Unspecified gastritis and gastroduodenitis without mention of hemorrhage     Past Surgical History:  Procedure Laterality Date  . A/V SHUNTOGRAM N/A 05/24/2018   Procedure: A/V SHUNTOGRAM;  Surgeon: Marty Heck, MD;  Location: [place] [facility];  Service:  Cardiovascular;  Laterality: N/A;  . APPENDECTOMY    . AV FISTULA PLACEMENT Right 06/26/2017   Procedure: ARTERIOVENOUS (AV) FISTULA CREATION VERSUS GRAFT RIGHT  ARM;  Surgeon: Conrad , MD;  Location: [place];  Service: Vascular;  Laterality: Right;  . AV FISTULA PLACEMENT Right 10/31/2017   Procedure: INSERTION OF ARTERIOVENOUS (AV) ARTEGRAFT,  RIGHT UPPER ARM;  Surgeon: Conrad , MD;  Location: [place];  Service: Vascular;  Laterality: Right;  . AV FISTULA PLACEMENT Left 05/29/2018   Procedure: ARTERIOVENOUS (AV) FISTULA CREATION  LEFT UPPER EXTREMITY;  Surgeon: Waynetta Sandy, MD;  Location: [place];  Service: Vascular;  Laterality: Left;  . AV FISTULA PLACEMENT Left 08/09/2018   Procedure: INSERTION OF ARTERIOVENOUS (AV) GORE-TEX GRAFT LEFT UPPER ARM;  Surgeon: Waynetta Sandy, MD;  Location: [place];  Service: Vascular;  Laterality: Left;  . CARDIAC DEFIBRILLATOR PLACEMENT  2010   St. Jude ICD  . CENTRAL LINE INSERTION Right 05/24/2018   Procedure: CENTRAL LINE INSERTION;  Surgeon: Marty Heck, MD;  Location: [place] [facility];  Service: Cardiovascular;  Laterality: Right;  . COLECTOMY  ~ 2003   "for Crohn's" ascending colon.    . CORONARY STENT INTERVENTION N/A 11/25/2016   Procedure: Coronary Stent Intervention;  Surgeon: Leonie Man, MD;  Location: [place] [facility];  Service: Cardiovascular;  Laterality: N/A;  .  EP IMPLANTABLE DEVICE N/A 09/14/2016   Procedure: ICD Generator Changeout;  Surgeon: Deboraha Sprang, MD;  Location: [place] [facility];  Service: Cardiovascular;  Laterality: N/A;  . ESOPHAGOGASTRODUODENOSCOPY N/A 07/12/2017   Procedure: ESOPHAGOGASTRODUODENOSCOPY (EGD);  Surgeon: Jerene Bears, MD;  Location: [facility]OSCOPY;  Service: Gastroenterology;  Laterality: N/A;  VAD pt so VAD team will need to accompany pt.   [name] FETAL SURGERY FOR CONGENITAL HERNIA     ????? pt has no knowledge of this..    . FISTULOGRAM Left 08/09/2018   Procedure:  FISTULOGRAM LEFT ARM;  Surgeon: Waynetta Sandy, MD;  Location: [facility];  Service: Vascular;  Laterality: Left;  . INGUINAL HERNIA REPAIR    . INSERTION OF DIALYSIS CATHETER Right 06/26/2017   Procedure: INSERTION OF TUNNELED  DIALYSIS CATHETER RIGHT INTERNAL JUGULAR;  Surgeon: Conrad Holland, MD;  Location: [place];  Service: Vascular;  Laterality: Right;  . INSERTION OF IMPLANTABLE LEFT VENTRICULAR ASSIST DEVICE N/A 06/12/2017   Procedure: INSERTION OF IMPLANTABLE LEFT VENTRICULAR ASSIST [place] 3;  Surgeon: Ivin Poot, MD;  Location: [place];  Service: Open Heart Surgery;  Laterality: N/A;  HM3 LVAD  CIRC ARREST  NITRIC OXIDE  . INSERTION OF IMPLANTABLE LEFT VENTRICULAR ASSIST DEVICE N/A 02/28/2018   Procedure: REDO INSERTION OF IMPLANTABLE LEFT VENTRICULAR ASSIST DEVICE - HEARTMATE 3;  Surgeon: Ivin Poot, MD;  Location: [place];  Service: Open Heart Surgery;  Laterality: N/A;  . IR REMOVAL TUN CV CATH W/O FL  02/26/2018  . IR THORACENTESIS ASP PLEURAL SPACE W/IMG GUIDE  06/28/2017  . LIGATION OF ARTERIOVENOUS  FISTULA Right 10/31/2017   Procedure: LIGATION OF BRACHIO-BASILIC VEIN TRANSPOSITION RIGHT ARM;  Surgeon: Conrad Connerton, MD;  Location: [place];  Service: Vascular;  Laterality: Right;  . MULTIPLE EXTRACTIONS WITH ALVEOLOPLASTY N/A 06/08/2017   Procedure: MULTIPLE EXTRACTION WITH ALVEOLOPLASTY AND PRE PROSTHETIC SURGERY AS NEEDED;  Surgeon: Lenn Cal, DDS;  Location: [facility];  Service: Oral Surgery;  Laterality: N/A;  . RIGHT HEART CATH N/A 06/07/2017   Procedure: RIGHT HEART CATH;  Surgeon: Larey Dresser, MD;  Location: [place] [facility];  Service: Cardiovascular;  Laterality: N/A;  . RIGHT HEART CATH N/A 02/08/2018   Procedure: RIGHT HEART CATH;  Surgeon: Larey Dresser, MD;  Location: [place] [facility];  Service: Cardiovascular;  Laterality: N/A;  . RIGHT HEART CATH N/A 08/09/2018   Procedure: RIGHT HEART CATH;  Surgeon: Larey Dresser, MD;  Location:  [place] [facility];  Service: Cardiovascular;  Laterality: N/A;  . RIGHT/LEFT HEART CATH AND CORONARY ANGIOGRAPHY N/A 11/23/2016   Procedure: Right/Left Heart Cath and Coronary Angiography;  Surgeon: Larey Dresser, MD;  Location: [place] [facility];  Service: Cardiovascular;  Laterality: N/A;  . TEE WITHOUT CARDIOVERSION N/A 06/12/2017   Procedure: TRANSESOPHAGEAL ECHOCARDIOGRAM (TEE);  Surgeon: Prescott Gum, Collier Salina, MD;  Location: [place];  Service: Open Heart Surgery;  Laterality: N/A;  . TEE WITHOUT CARDIOVERSION N/A 02/28/2018   Procedure: TRANSESOPHAGEAL ECHOCARDIOGRAM (TEE);  Surgeon: Prescott Gum, Collier Salina, MD;  Location: [place];  Service: Open Heart Surgery;  Laterality: N/A;  . TEMPORARY DIALYSIS CATHETER Left 05/24/2018   Procedure: TEMPORARY DIALYSIS CATHETER;  Surgeon: Marty Heck, MD;  Location: [place] [facility];  Service: Cardiovascular;  Laterality: Left;  . THROMBECTOMY AND REVISION OF ARTERIOVENTOUS (AV) GORETEX  GRAFT Right 12/12/2017   Procedure: THROMBECTOMY ARTERIOVENTOUS (AV) GORETEX  GRAFT RIGHT UPPER ARM;  Surgeon: Conrad Holland Patent, MD;  Location: [facility];  Service: Vascular;  Laterality: Right;    There were no vitals filed for this visit.  Subjective Assessment - 10/30/18 8757    Pertinent History  .PMH: ischemic cardimyopathy, LVAD 10/18, ESRD, dialysis M-W-F, 9/19 fistula placement, graft placement in LUE 10/19, catheter in left chest,  DMII    Limitations  no estim! LVAT    Patient Stated Goals  regain use of LUE    Currently in Pain?  Yes    Pain Score  2     Pain Location  Hand    Pain Orientation  Left    Pain Descriptors / Indicators  Aching    Pain Type  Chronic pain    Pain Onset  More than a month ago    Pain Frequency  Intermittent    Aggravating Factors   movement    Pain Relieving Factors  heat                   OT Treatments/Exercises (OP) - 10/30/18 0001      Hand Exercises   Other Hand Exercises  Pt also shown A/ROM and place and hold for  index PIP flexion    Other Hand Exercises  Reviewed thumb HEP - pt performed each x 10 reps      Functional Reaching Activities   Mid Level  mid to high level reaching to place and remove clothespins on antenna (yellow to red resistance) for proper reaching technique LUE (preventing shoulder abduction) and pinch strength. Pt had difficulty using index finger for pinching clothespin. Reviewed and emphasized pinch ex w/ putty b/t index and thumb. Also recommended using forearm rotation vs. whole arm IR when placing clothespins to lower positions      LUE Fluidotherapy   Number Minutes Fluidotherapy  12 Minutes    LUE Fluidotherapy Location  Hand;Wrist    Comments  at beginning of session to decr pain and stiffness               OT Short Term Goals - 10/04/18 1759      OT SHORT TERM GOAL #1   Title  I with HEP    Time  6    Period  Weeks    Status  New    Target Date  11/18/18      OT SHORT TERM GOAL #2   Title  Pt will demonstrate 90% composite finger flexion for LUE for increased functional use.    Baseline  grossly 75-80%    Time  6    Period  Weeks    Status  New      OT SHORT TERM GOAL #3   Title  Pt will increase LUE grip strength to 15 lbs for increased functional use during ADLS.    Baseline  RUE 50, LUE 5    Time  6    Period  Weeks    Status  New      OT SHORT TERM GOAL #4   Title  Pt will dress himself modified independently.    Time  6    Period  Weeks    Status  New      OT SHORT TERM GOAL #5   Title  Pt will demonstrate ability to open a bottle and fasten buttons modified indepdently using AE prn.    Time  6    Period  Weeks    Status  New        OT Long Term  Goals - 10/04/18 1802      OT LONG TERM GOAL #1   Title  Pt will demonstrate increased LUE functional use in order to change the battery in his LVAD modified independently.    Time  12    Period  Weeks    Status  New    Target Date  01/02/19      OT LONG TERM GOAL #2   Title  Pt  will demonstrate adequate LUE strength and endurance for pt to make his bed, fold laundry and wash dishes modified independently.    Time  12    Period  Weeks    Status  New      OT LONG TERM GOAL #3   Title  Pt demonstrate improved LUE functional use as evidenced by increasing box/ blocks score to 35 blocks or greater.     Baseline  RUE 60, LUE 28     Time  12    Period  Weeks    Status  New      OT LONG TERM GOAL #4   Title  Pt will verbalize understanding of adpated strategies/ AE to maximize I with ADLs.    Time  12    Period  Weeks    Status  New            Plan - 10/30/18 1228    Clinical Impression Statement  Pt w/ decreased radial side of hand function (thumb and index finger). Pt also sore Lt shoulder as pt compensates into abduction w/ forward reaching.     Occupational Profile and client history currently impacting functional performance  Prior to placement of graft in LUE pt reports he was I with ADLs and driving.PMH: ischemic cardimyopathy, LVAD 10/18, ESRD, dialysis M-W-F, 9/19 fistula placement, graft placement in LUE 10/19, catheter in left chest,  DMII    Occupational performance deficits (Please refer to evaluation for details):  IADL's;ADL's;Play;Leisure;Social Participation    Rehab Potential  Fair    Current Impairments/barriers affecting progress:  severity of deficits, complex medical hx, pain    OT Frequency  2x / week    OT Duration  12 weeks    OT Treatment/Interventions  Self-care/ADL training;Therapeutic exercise;Patient/family education;Neuromuscular education;Moist Heat;Paraffin;Fluidtherapy;Energy conservation;Therapeutic activities;Passive range of motion;Manual Therapy;DME and/or AE instruction;Contrast Bath;Cryotherapy    Plan  continue fluido, Lt hand function, grip, thumb movement, proper positioning w/ reaching    Consulted and Agree with Plan of Care  Patient       Patient will benefit from skilled therapeutic intervention in order to  improve the following deficits and impairments:  Increased edema, Impaired flexibility, Impaired sensation, Pain, Decreased coordination, Decreased activity tolerance, Decreased endurance, Decreased range of motion, Decreased strength, Impaired UE functional use, Impaired perceived functional ability, Decreased safety awareness, Difficulty walking, Decreased knowledge of precautions, Decreased balance  Visit Diagnosis: Pain in left hand  Stiffness of left hand, not elsewhere classified  Muscle weakness (generalized)    Problem List Patient Active Problem List   Diagnosis Date Noted  . Subtherapeutic international normalized ratio (INR) 08/28/2018  . Elevated LDH 08/22/2018  . Acute vomiting   . Nausea & vomiting 06/11/2018  . Heart failure ([place]) 05/23/2018  . Hypertensive urgency 03/25/2018  . Benign essential HTN   . Crohn's disease with complication ([place])   . Diabetes mellitus type 2 in nonobese (HCC)   . Anemia of chronic disease   . Sinus tachycardia   . Leukocytosis   . Left ventricular assist  device (LVAD) complication 16/06/9603  . Left ventricular assist device (LVAD) complication, initial encounter 02/28/2018  . Factor V Leiden carrier ([place]) 01/10/2018  . History of creation of ostomy ([place]) 01/10/2018  . Hypercoagulable state ([place]) 01/10/2018  . ESRD (end stage renal disease) ([place]) 12/11/2017  . Left ventricular assist device present ([place]) 12/05/2017  . ESRD (end stage renal disease) on dialysis ([place]) 10/29/2017  . Nausea with vomiting 09/12/2017  . Intractable nausea and vomiting 07/31/2017  . 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) ([place]) 07/10/2017  . LVAD (left ventricular assist device) present ([place])   . Palliative care by specialist   . Chronic systolic CHF (congestive heart failure) ([place]) 06/07/2017  . ACP (advance care planning)   . Encounter for central line placement   . Diarrhea   . Palliative care encounter   . Nausea   . CHF exacerbation  ([place]) 04/05/2017  . Diabetic keto-acidosis ([place]) 04/05/2017  . Type 2 diabetes mellitus with complication, with long-term current use of insulin ([place]) 03/08/2017  . ESRD on dialysis ([place]) 01/17/2017  . AKI (acute kidney injury) ([place])   . Non-ST elevation (NSTEMI) myocardial infarction ([place])   . CHF (congestive heart failure) ([place]) 09/02/2016  . Elevated serum creatinine   . Encounter for therapeutic drug monitoring 08/03/2016  . Chest pain 04/26/2012  . SVT (supraventricular tachycardia) ([place]) 04/26/2012  . Gastroparesis 11/18/2011  . Nausea and vomiting in adult 11/17/2011  . Coronary artery disease   . Ischemic cardiomyopathy   . Essential hypertension   . Automatic implantable cardioverter-defibrillator in situ   . Deep venous thrombosis ([place])   . Hypersomnolent   . POLYP, COLON 09/25/2008  . Type 2 diabetes mellitus ([place]) 09/25/2008  . Dyslipidemia, goal LDL below 70 09/25/2008  . GASTRITIS 09/25/2008  . DIVERTICULOSIS, COLON 09/25/2008  . RECTAL MASS 09/25/2008  . History of Crohn's disease 09/05/2006    Carey Bullocks, OTR/L 10/30/2018, 12:33 PM  [facility] [address] [place], [place], [address] Phone: [phone]   Fax:  [phone]  Name: [name] MRN: [id number] Date of Birth: 18-Jan-1966

## 2018-11-02 ENCOUNTER — Encounter (HOSPITAL_COMMUNITY): Payer: Self-pay | Admitting: Emergency Medicine

## 2018-11-02 ENCOUNTER — Emergency Department (HOSPITAL_COMMUNITY)
Admission: EM | Admit: 2018-11-02 | Discharge: 2018-11-02 | Disposition: A | Discharge status: Home / Self Care | Payer: BLUE CROSS/BLUE SHIELD | Attending: Emergency Medicine | Admitting: Emergency Medicine

## 2018-11-02 ENCOUNTER — Other Ambulatory Visit: Payer: Self-pay

## 2018-11-02 ENCOUNTER — Ambulatory Visit (HOSPITAL_COMMUNITY): Payer: Self-pay | Admitting: Pharmacist

## 2018-11-02 DIAGNOSIS — T82897A Other specified complication of cardiac prosthetic devices, implants and grafts, initial encounter: Secondary | ICD-10-CM | POA: Diagnosis present

## 2018-11-02 DIAGNOSIS — T829XXA Unspecified complication of cardiac and vascular prosthetic device, implant and graft, initial encounter: Secondary | ICD-10-CM

## 2018-11-02 DIAGNOSIS — Z95811 Presence of heart assist device: Secondary | ICD-10-CM

## 2018-11-02 DIAGNOSIS — Z7982 Long term (current) use of aspirin: Secondary | ICD-10-CM | POA: Insufficient documentation

## 2018-11-02 DIAGNOSIS — N186 End stage renal disease: Secondary | ICD-10-CM | POA: Insufficient documentation

## 2018-11-02 DIAGNOSIS — Y828 Other medical devices associated with adverse incidents: Secondary | ICD-10-CM | POA: Insufficient documentation

## 2018-11-02 DIAGNOSIS — E119 Type 2 diabetes mellitus without complications: Secondary | ICD-10-CM | POA: Insufficient documentation

## 2018-11-02 DIAGNOSIS — Z794 Long term (current) use of insulin: Secondary | ICD-10-CM | POA: Diagnosis not present

## 2018-11-02 DIAGNOSIS — I5022 Chronic systolic (congestive) heart failure: Secondary | ICD-10-CM | POA: Insufficient documentation

## 2018-11-02 DIAGNOSIS — I132 Hypertensive heart and chronic kidney disease with heart failure and with stage 5 chronic kidney disease, or end stage renal disease: Secondary | ICD-10-CM | POA: Insufficient documentation

## 2018-11-02 DIAGNOSIS — Z5181 Encounter for therapeutic drug level monitoring: Secondary | ICD-10-CM

## 2018-11-02 DIAGNOSIS — Z79899 Other long term (current) drug therapy: Secondary | ICD-10-CM | POA: Insufficient documentation

## 2018-11-02 LAB — POCT INR: INR: 2.8 (ref 2.0–3.0)

## 2018-11-02 NOTE — ED Notes (Signed)
LVAD coordinator at bedside.

## 2018-11-02 NOTE — Progress Notes (Signed)
LVAD Coordinator ED Encounter  LVAD is a HM III  and was implanted on 03/01/18 by Dr. Prescott Gum.   Received a call from dialysis center stating that patient was having low flows during treatment. He was alert, oriented, and not in distress. He states prior to the first low flow he started to have some back pain/muscle tightness. When they returned 500cc of fluid he said the pain went away. Denies shortness of breath and nausea. Discussed with dialysis nurse need to return more fluid. At that time the charge nurse at the dialysis center stated that the patient must be transported to the ER despite being asymptomatic.   Patient states he feels fine and wants to go home.   Vital signs: HR:  Doppler MAP: not done Automated BP: 118/65 (83) O2 Sat: 100% on RA  LVAD interrogation reveals:  Speed:  5000 Flow: 3.8 Power: 4.4  PI: 6.1  Alarms: 3 low flows during HD Events: several PI events during treatment but this is normal for him   [name] PA at bedside with patient. No LVAD issues and pump is functioning as expected. Able to independently manage LVAD equipment. No LVAD needs at this time. Will discharge patient home with no further fluid given as he received 1.6L at the dialysis center per Dr. Aundra Dubin.   [name] RN [facility] Coordinator  Office: [phone]  24/7 Pager: [phone]

## 2018-11-02 NOTE — Progress Notes (Signed)
  Pt presents due to Low flow events during HD.  He was only 11 minutes short of his treatment.    Pt was alert and oriented and in no distress. Denies pain, nausea, or vomiting.   He received 1.6 L of IVF post treatment. He did not want transport to ED, but HD was adamant.   HD Charge RN discussed with VAD coordinator on phone, who recommended against transfer to ED, but HD center was adamant patient be transferred to ER.  Pt in NAD. Vitals stable. He had numerous PI events during HD, and 3 LFs at the end.   LVAD Interrogation HM 3: Speed: 5500 Flow: 3.7 PI: 6.6 Power: 4.4.   VAD interrogated personally. Parameters stable.      Pt needs no further fluid as given 1.6L back and vitals stable. No further Low flows.   As have previously discussed with HD, recommend pulling less.   Discussed all above with Dr. Aundra Dubin. Pt is fine for discharge home with follow up as outpatient.    Legrand Como [address] [place], [place] 11/02/2018 12:45 PM

## 2018-11-02 NOTE — Discharge Instructions (Addendum)
Return here as needed. Follow up with your doctor. °

## 2018-11-02 NOTE — ED Provider Notes (Signed)
[facility] EMERGENCY DEPARTMENT Provider Note   CSN: [id number] Arrival date & time: 11/02/18  1157    History   Chief Complaint No chief complaint on file.   HPI [name] is a 54 y.o. male.     HPI Patient presents to the emergency department with his LVAD alarm sounding.  The patient was at dialysis when this started.  Since EMS arrived they have not seen or heard the alarm go off.  Patient states he received a fair amount of fluid in response to this.  He was reported by EMS to get 1650 of fluid at dialysis.  The patient denies chest pain, shortness of breath, headache,blurred vision, neck pain, fever, cough, weakness, numbness, dizziness, anorexia, edema, abdominal pain, nausea, vomiting, diarrhea, rash, back pain, dysuria, hematemesis, bloody stool, near syncope, or syncope. Past Medical History:  Diagnosis Date  . Angina   . ASCVD (arteriosclerotic cardiovascular disease)    , Anterior infarction 2005, LAD diagonal bifurcation intervention 03/2004  . Automatic implantable cardiac defibrillator -[employer]. Jude's       . Benign neoplasm of colon   . CHF (congestive heart failure) ([place])   . Chronic systolic heart failure ([place])   . Coronary artery disease     Widely patent previously placed stents in the left anterior   . Crohn's disease ([place])   . 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 Dialysis patient ([place])   . Dyspnea   . Gastroparesis   . GERD (gastroesophageal reflux disease)   . High cholesterol   . Hyperlipidemia   . Hypersomnolent    Previous diagnosis of narcolepsy  . Hypertension, essential   . Ischemic cardiomyopathy    Ejection fraction 15-20% catheterization 2010  . Type II or unspecified type diabetes mellitus without mention of complication, not stated as uncontrolled   . Unspecified gastritis and gastroduodenitis without mention of hemorrhage     Patient Active Problem List   Diagnosis Date Noted  .  Subtherapeutic international normalized ratio (INR) 08/28/2018  . Elevated LDH 08/22/2018  . Acute vomiting   . Nausea & vomiting 06/11/2018  . Heart failure ([place]) 05/23/2018  . Hypertensive urgency 03/25/2018  . Benign essential HTN   . Crohn's disease with complication ([place])   . Diabetes mellitus type 2 in nonobese (HCC)   . Anemia of chronic disease   . Sinus tachycardia   . Leukocytosis   . Left ventricular assist device (LVAD) complication 67/67/2094  . Left ventricular assist device (LVAD) complication, initial encounter 02/28/2018  . Factor V Leiden carrier ([place]) 01/10/2018  . History of creation of ostomy ([place]) 01/10/2018  . Hypercoagulable state ([place]) 01/10/2018  . ESRD (end stage renal disease) ([place]) 12/11/2017  . Left ventricular assist device present ([place]) 12/05/2017  . ESRD (end stage renal disease) on dialysis ([place]) 10/29/2017  . Nausea with vomiting 09/12/2017  . Intractable nausea and vomiting 07/31/2017  . 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) ([place]) 07/10/2017  . LVAD (left ventricular assist device) present ([place])   . Palliative care by specialist   . Chronic systolic CHF (congestive heart failure) ([place]) 06/07/2017  . ACP (advance care planning)   . Encounter for central line placement   . Diarrhea   . Palliative care encounter   . Nausea   . CHF exacerbation ([place]) 04/05/2017  . Diabetic keto-acidosis ([place]) 04/05/2017  . Type 2 diabetes mellitus with complication, with long-term current use of insulin ([place]) 03/08/2017  .  ESRD on dialysis ([place]) 01/17/2017  . AKI (acute kidney injury) ([place])   . Non-ST elevation (NSTEMI) myocardial infarction ([place])   . CHF (congestive heart failure) ([place]) 09/02/2016  . Elevated serum creatinine   . Encounter for therapeutic drug monitoring 08/03/2016  . Chest pain 04/26/2012  . SVT (supraventricular tachycardia) ([place]) 04/26/2012  . Gastroparesis 11/18/2011  . Nausea and vomiting in adult 11/17/2011  . Coronary  artery disease   . Ischemic cardiomyopathy   . Essential hypertension   . Automatic implantable cardioverter-defibrillator in situ   . Deep venous thrombosis ([place])   . Hypersomnolent   . POLYP, COLON 09/25/2008  . Type 2 diabetes mellitus ([place]) 09/25/2008  . Dyslipidemia, goal LDL below 70 09/25/2008  . GASTRITIS 09/25/2008  . DIVERTICULOSIS, COLON 09/25/2008  . RECTAL MASS 09/25/2008  . History of Crohn's disease 09/05/2006    Past Surgical History:  Procedure Laterality Date  . A/V SHUNTOGRAM N/A 05/24/2018   Procedure: A/V SHUNTOGRAM;  Surgeon: Marty Heck, MD;  Location: [facility];  Service: Cardiovascular;  Laterality: N/A;  . APPENDECTOMY    . AV FISTULA PLACEMENT Right 06/26/2017   Procedure: ARTERIOVENOUS (AV) FISTULA CREATION VERSUS GRAFT RIGHT  ARM;  Surgeon: Conrad Trussville, MD;  Location: [facility];  Service: Vascular;  Laterality: Right;  . AV FISTULA PLACEMENT Right 10/31/2017   Procedure: INSERTION OF ARTERIOVENOUS (AV) ARTEGRAFT,  RIGHT UPPER ARM;  Surgeon: Conrad Peterstown, MD;  Location: [facility];  Service: Vascular;  Laterality: Right;  . AV FISTULA PLACEMENT Left 05/29/2018   Procedure: ARTERIOVENOUS (AV) FISTULA CREATION  LEFT UPPER EXTREMITY;  Surgeon: Waynetta Sandy, MD;  Location: [facility];  Service: Vascular;  Laterality: Left;  . AV FISTULA PLACEMENT Left 08/09/2018   Procedure: INSERTION OF ARTERIOVENOUS (AV) GORE-TEX GRAFT LEFT UPPER ARM;  Surgeon: Waynetta Sandy, MD;  Location: [facility];  Service: Vascular;  Laterality: Left;  . CARDIAC DEFIBRILLATOR PLACEMENT  2010   St. Jude ICD  . CENTRAL LINE INSERTION Right 05/24/2018   Procedure: CENTRAL LINE INSERTION;  Surgeon: Marty Heck, MD;  Location: [facility];  Service: Cardiovascular;  Laterality: Right;  . COLECTOMY  ~ 2003   "for Crohn's" ascending colon.    . CORONARY STENT INTERVENTION N/A 11/25/2016   Procedure: Coronary Stent Intervention;  Surgeon: Leonie Man,  MD;  Location: [facility];  Service: Cardiovascular;  Laterality: N/A;  . EP IMPLANTABLE DEVICE N/A 09/14/2016   Procedure: ICD Generator Changeout;  Surgeon: Deboraha Sprang, MD;  Location: [facility];  Service: Cardiovascular;  Laterality: N/A;  . ESOPHAGOGASTRODUODENOSCOPY N/A 07/12/2017   Procedure: ESOPHAGOGASTRODUODENOSCOPY (EGD);  Surgeon: Jerene Bears, MD;  Location: [facility] ENDOSCOPY;  Service: Gastroenterology;  Laterality: N/A;  VAD pt so VAD team will need to accompany pt.   [name] FETAL SURGERY FOR CONGENITAL HERNIA     ????? pt has no knowledge of this..    . FISTULOGRAM Left 08/09/2018   Procedure: FISTULOGRAM LEFT ARM;  Surgeon: Waynetta Sandy, MD;  Location: [facility];  Service: Vascular;  Laterality: Left;  . INGUINAL HERNIA REPAIR    . INSERTION OF DIALYSIS CATHETER Right 06/26/2017   Procedure: INSERTION OF TUNNELED  DIALYSIS CATHETER RIGHT INTERNAL JUGULAR;  Surgeon: Conrad Rome, MD;  Location: [facility];  Service: Vascular;  Laterality: Right;  . INSERTION OF IMPLANTABLE LEFT VENTRICULAR ASSIST DEVICE N/A 06/12/2017   Procedure: INSERTION OF IMPLANTABLE LEFT VENTRICULAR ASSIST DEVICE-HEARTMATE 3;  Surgeon: Prescott Gum, Collier Salina, MD;  Location: [facility];  Service: Open Heart Surgery;  Laterality: N/A;  HM3 LVAD  CIRC ARREST  NITRIC OXIDE  . INSERTION OF IMPLANTABLE LEFT VENTRICULAR ASSIST DEVICE N/A 02/28/2018   Procedure: REDO INSERTION OF IMPLANTABLE LEFT VENTRICULAR ASSIST DEVICE - HEARTMATE 3;  Surgeon: Ivin Poot, MD;  Location: [facility];  Service: Open Heart Surgery;  Laterality: N/A;  . IR REMOVAL TUN CV CATH W/O FL  02/26/2018  . IR THORACENTESIS ASP PLEURAL SPACE W/IMG GUIDE  06/28/2017  . LIGATION OF ARTERIOVENOUS  FISTULA Right 10/31/2017   Procedure: LIGATION OF BRACHIO-BASILIC VEIN TRANSPOSITION RIGHT ARM;  Surgeon: Conrad Boyds, MD;  Location: [facility];  Service: Vascular;  Laterality: Right;  . MULTIPLE EXTRACTIONS WITH ALVEOLOPLASTY N/A 06/08/2017    Procedure: MULTIPLE EXTRACTION WITH ALVEOLOPLASTY AND PRE PROSTHETIC SURGERY AS NEEDED;  Surgeon: Lenn Cal, DDS;  Location: [facility];  Service: Oral Surgery;  Laterality: N/A;  . RIGHT HEART CATH N/A 06/07/2017   Procedure: RIGHT HEART CATH;  Surgeon: Larey Dresser, MD;  Location: [facility];  Service: Cardiovascular;  Laterality: N/A;  . RIGHT HEART CATH N/A 02/08/2018   Procedure: RIGHT HEART CATH;  Surgeon: Larey Dresser, MD;  Location: [facility];  Service: Cardiovascular;  Laterality: N/A;  . RIGHT HEART CATH N/A 08/09/2018   Procedure: RIGHT HEART CATH;  Surgeon: Larey Dresser, MD;  Location: [facility];  Service: Cardiovascular;  Laterality: N/A;  . RIGHT/LEFT HEART CATH AND CORONARY ANGIOGRAPHY N/A 11/23/2016   Procedure: Right/Left Heart Cath and Coronary Angiography;  Surgeon: Larey Dresser, MD;  Location: [facility];  Service: Cardiovascular;  Laterality: N/A;  . TEE WITHOUT CARDIOVERSION N/A 06/12/2017   Procedure: TRANSESOPHAGEAL ECHOCARDIOGRAM (TEE);  Surgeon: Prescott Gum, Collier Salina, MD;  Location: [facility];  Service: Open Heart Surgery;  Laterality: N/A;  . TEE WITHOUT CARDIOVERSION N/A 02/28/2018   Procedure: TRANSESOPHAGEAL ECHOCARDIOGRAM (TEE);  Surgeon: Prescott Gum, Collier Salina, MD;  Location: [facility];  Service: Open Heart Surgery;  Laterality: N/A;  . TEMPORARY DIALYSIS CATHETER Left 05/24/2018   Procedure: TEMPORARY DIALYSIS CATHETER;  Surgeon: Marty Heck, MD;  Location: [facility];  Service: Cardiovascular;  Laterality: Left;  . THROMBECTOMY AND REVISION OF ARTERIOVENTOUS (AV) GORETEX  GRAFT Right 12/12/2017   Procedure: THROMBECTOMY ARTERIOVENTOUS (AV) GORETEX  GRAFT RIGHT UPPER ARM;  Surgeon: Conrad Dora, MD;  Location: [facility];  Service: Vascular;  Laterality: Right;        Home Medications    Prior to Admission medications   Medication Sig Start Date End Date Taking? Authorizing Provider  amiodarone (PACERONE) 200 MG tablet  Take 200 mg by mouth daily.    [provider]  aspirin EC 81 MG tablet Take 1 tablet (81 mg total) by mouth daily. 08/20/18   Eileen Stanford, PA-C  atorvastatin (LIPITOR) 40 MG tablet Take 1 tablet (40 mg total) by mouth daily at 6 PM. 01/25/18   [name], MD  calcitRIOL (ROCALTROL) 0.5 MCG capsule Take 1 capsule (0.5 mcg total) by mouth every Monday, Wednesday, and Friday. 06/06/18   Georgiana Shore, NP  citalopram (CELEXA) 20 MG tablet Take 1 tablet (20 mg total) by mouth daily. 03/19/18   Georgiana Shore, NP  docusate sodium (COLACE) 100 MG capsule Take 100 mg by mouth daily as needed for mild constipation.    [provider]  dronabinol (MARINOL) 2.5 MG capsule  Take 1 capsule (2.5 mg total) by mouth 2 (two) times daily before lunch and supper. 03/27/18   Larey Dresser, MD  erythromycin (E-MYCIN) 250 MG tablet Take 1 tablet (250 mg total) by mouth 3 (three) times daily. 03/25/18   Isaiah Serge, NP  insulin aspart (NOVOLOG) 100 UNIT/ML injection Inject 5 Units into the skin 3 (three) times daily with meals. 01/22/18   Georgiana Shore, NP  insulin glargine (LANTUS) 100 UNIT/ML injection Inject 0.1 mLs (10 Units total) into the skin daily. 05/09/18   Lovenia Kim, MD  isosorbide-hydrALAZINE (BIDIL) 20-37.5 MG tablet Take 1.5 tablets by mouth every 8 (eight) hours. Take 1.5 tablets every 8 hours on non dialysis days. Patient taking differently: Take 1 tablet by mouth daily.  03/19/18   Georgiana Shore, NP  lidocaine-prilocaine (EMLA) cream Apply 1 application topically as needed (to numb port).    [provider]  LORazepam (ATIVAN) 0.5 MG tablet Take 1 tablet (0.5 mg total) by mouth every 12 (twelve) hours as needed (nausea). 03/27/18   Larey Dresser, MD  metoCLOPramide (REGLAN) 10 MG tablet Take 1 tablet (10 mg total) by mouth 3 (three) times daily. 01/22/18   Georgiana Shore, NP  multivitamin (RENA-VIT) TABS tablet Take 1 tablet by mouth daily.     [provider]  pantoprazole (PROTONIX) 40 MG tablet Take 40 mg by mouth 2 (two) times daily.     [provider]  promethazine (PHENERGAN) 12.5 MG tablet Take 1 tablet (12.5 mg total) by mouth every 6 (six) hours as needed for nausea or vomiting. 10/23/18   Larey Dresser, MD  sevelamer carbonate (RENVELA) 800 MG tablet Take 1,600-4,000 mg by mouth See admin instructions. 4075m three times daily with meals and 16063mtwice daily with snacks    [provider]  traMADol (ULTRAM) 50 MG tablet Take 1 tablet (50 mg total) by mouth every 6 (six) hours as needed for moderate pain. Patient not taking: Reported on 09/13/2018 08/18/18   ThEileen StanfordPA-C  warfarin (COUMADIN) 5 MG tablet TAKE AS FOLLOWS: MON-WED-FRI 1&1/2 TABLETS DAILY. ON ALL OTHER DAYS 1 TABLET Patient taking differently: TAKE AS FOLLOWS: Tue and FRI 1&1/2 TABLETS DAILY. ON ALL OTHER DAYS 1 TABLET 09/12/18   McLarey DresserMD    Family History Family History  Problem Relation Age of Onset  . Colon polyps Mother   . Diabetes Mother   . Heart attack Father   . Diabetes Father   . Stroke Paternal Grandfather   . Colitis Sister   . Heart disease Brother   . Colitis Sister   . Colon cancer Neg Hx     Social History Social History   Tobacco Use  . Smoking status: Never Smoker  . Smokeless tobacco: Never Used  Substance Use Topics  . Alcohol use: No  . Drug use: No     Allergies   Metformin and related   Review of Systems Review of Systems All other systems negative except as documented in the HPI. All pertinent positives and negatives as reviewed in the HPI.  Physical Exam Updated Vital Signs BP (!) 118/95   Pulse 77   Temp 98.4 F (36.9 C) (Oral)   Resp 16   Ht 5' 7"  (1.702 m)   Wt 83 kg   SpO2 100%   BMI 28.66 kg/m   Physical Exam Vitals signs and nursing note reviewed.  Constitutional:      General: He is  not in acute distress.    Appearance: He is  well-developed.  HENT:     Head: Normocephalic and atraumatic.  Eyes:     Pupils: Pupils are equal, round, and reactive to light.  Cardiovascular:     Rate and Rhythm: Normal rate and regular rhythm.     Heart sounds: No friction rub. No gallop.   Pulmonary:     Effort: Pulmonary effort is normal. No respiratory distress.     Breath sounds: Normal breath sounds. No stridor. No wheezing or rhonchi.  Skin:    General: Skin is warm and dry.  Neurological:     Mental Status: He is alert and oriented to person, place, and time.      ED Treatments / Results  Labs (all labs ordered are listed, but only abnormal results are displayed) Labs Reviewed - No data to display  EKG None  Radiology No results found.  Procedures Procedures (including critical care time)  Medications Ordered in ED Medications - No data to display   Initial Impression / Assessment and Plan / ED Course  I have reviewed the triage vital signs and the nursing notes.  Pertinent labs & imaging results that were available during my care of the patient were reviewed by me and considered in my medical decision making (see chart for details).        The LVAD team has been in to assess the patient and feels that he is safe to go home.  Patient received 1650 of fluid and I feel that sufficient and that is why it was alarming.  Final Clinical Impressions(s) / ED Diagnoses   Final diagnoses:  None    ED Discharge Orders    None       Dalia Heading, PA-C 11/02/18 1306    Isla Pence, MD 11/02/18 1344

## 2018-11-02 NOTE — ED Triage Notes (Signed)
Pt from dialysis. Pt was completing his treatment and his LVAD started alarming.Pt was 11 minutes short on his treatment. Dialysis reports he gained 4.900 since previous treatment. They removed 2481 off today. Pt received 1,600 mL of fluid post treatment.    BP 122/90 100% RA 82 HR

## 2018-11-02 NOTE — ED Notes (Signed)
Patient verbalizes understanding of discharge instructions. Opportunity for questioning and answers were provided. Armband removed by staff, pt discharged from ED ambulatory to home.  

## 2018-11-06 ENCOUNTER — Ambulatory Visit: Payer: BLUE CROSS/BLUE SHIELD | Admitting: Occupational Therapy

## 2018-11-08 ENCOUNTER — Ambulatory Visit: Payer: BLUE CROSS/BLUE SHIELD | Attending: Cardiology | Admitting: Occupational Therapy

## 2018-11-09 ENCOUNTER — Ambulatory Visit (HOSPITAL_COMMUNITY): Payer: Self-pay | Admitting: Pharmacist

## 2018-11-09 LAB — POCT INR: INR: 2.8 (ref 2–3)

## 2018-11-13 ENCOUNTER — Encounter (HOSPITAL_COMMUNITY): Payer: Self-pay

## 2018-11-13 ENCOUNTER — Ambulatory Visit: Payer: BLUE CROSS/BLUE SHIELD | Admitting: Occupational Therapy

## 2018-11-13 ENCOUNTER — Ambulatory Visit (HOSPITAL_COMMUNITY)
Admission: RE | Admit: 2018-11-13 | Discharge: 2018-11-13 | Disposition: A | Discharge status: Home / Self Care | Payer: BLUE CROSS/BLUE SHIELD | Source: Ambulatory Visit | Attending: Cardiology | Admitting: Cardiology

## 2018-11-13 VITALS — BP 109/66 | HR 93 | Wt 192.0 lb

## 2018-11-13 DIAGNOSIS — I252 Old myocardial infarction: Secondary | ICD-10-CM | POA: Diagnosis not present

## 2018-11-13 DIAGNOSIS — K219 Gastro-esophageal reflux disease without esophagitis: Secondary | ICD-10-CM | POA: Diagnosis not present

## 2018-11-13 DIAGNOSIS — Z9049 Acquired absence of other specified parts of digestive tract: Secondary | ICD-10-CM | POA: Insufficient documentation

## 2018-11-13 DIAGNOSIS — E785 Hyperlipidemia, unspecified: Secondary | ICD-10-CM | POA: Insufficient documentation

## 2018-11-13 DIAGNOSIS — Z95811 Presence of heart assist device: Secondary | ICD-10-CM

## 2018-11-13 DIAGNOSIS — N186 End stage renal disease: Secondary | ICD-10-CM | POA: Insufficient documentation

## 2018-11-13 DIAGNOSIS — E1151 Type 2 diabetes mellitus with diabetic peripheral angiopathy without gangrene: Secondary | ICD-10-CM | POA: Insufficient documentation

## 2018-11-13 DIAGNOSIS — K5090 Crohn's disease, unspecified, without complications: Secondary | ICD-10-CM | POA: Diagnosis not present

## 2018-11-13 DIAGNOSIS — R531 Weakness: Secondary | ICD-10-CM | POA: Diagnosis not present

## 2018-11-13 DIAGNOSIS — I4892 Unspecified atrial flutter: Secondary | ICD-10-CM | POA: Diagnosis not present

## 2018-11-13 DIAGNOSIS — E1122 Type 2 diabetes mellitus with diabetic chronic kidney disease: Secondary | ICD-10-CM | POA: Insufficient documentation

## 2018-11-13 DIAGNOSIS — I255 Ischemic cardiomyopathy: Secondary | ICD-10-CM | POA: Diagnosis not present

## 2018-11-13 DIAGNOSIS — K3184 Gastroparesis: Secondary | ICD-10-CM

## 2018-11-13 DIAGNOSIS — E1143 Type 2 diabetes mellitus with diabetic autonomic (poly)neuropathy: Secondary | ICD-10-CM

## 2018-11-13 DIAGNOSIS — I132 Hypertensive heart and chronic kidney disease with heart failure and with stage 5 chronic kidney disease, or end stage renal disease: Secondary | ICD-10-CM | POA: Diagnosis not present

## 2018-11-13 DIAGNOSIS — Z955 Presence of coronary angioplasty implant and graft: Secondary | ICD-10-CM | POA: Insufficient documentation

## 2018-11-13 DIAGNOSIS — Z7901 Long term (current) use of anticoagulants: Secondary | ICD-10-CM | POA: Diagnosis not present

## 2018-11-13 DIAGNOSIS — R110 Nausea: Secondary | ICD-10-CM

## 2018-11-13 DIAGNOSIS — D6851 Activated protein C resistance: Secondary | ICD-10-CM | POA: Diagnosis not present

## 2018-11-13 DIAGNOSIS — I2510 Atherosclerotic heart disease of native coronary artery without angina pectoris: Secondary | ICD-10-CM | POA: Diagnosis not present

## 2018-11-13 DIAGNOSIS — Z86718 Personal history of other venous thrombosis and embolism: Secondary | ICD-10-CM | POA: Diagnosis not present

## 2018-11-13 DIAGNOSIS — I10 Essential (primary) hypertension: Secondary | ICD-10-CM

## 2018-11-13 DIAGNOSIS — E119 Type 2 diabetes mellitus without complications: Secondary | ICD-10-CM

## 2018-11-13 DIAGNOSIS — Z7982 Long term (current) use of aspirin: Secondary | ICD-10-CM | POA: Diagnosis not present

## 2018-11-13 DIAGNOSIS — I5022 Chronic systolic (congestive) heart failure: Secondary | ICD-10-CM | POA: Diagnosis not present

## 2018-11-13 DIAGNOSIS — Z794 Long term (current) use of insulin: Secondary | ICD-10-CM | POA: Insufficient documentation

## 2018-11-13 DIAGNOSIS — Z79899 Other long term (current) drug therapy: Secondary | ICD-10-CM | POA: Diagnosis not present

## 2018-11-13 DIAGNOSIS — Z992 Dependence on renal dialysis: Secondary | ICD-10-CM | POA: Diagnosis not present

## 2018-11-13 NOTE — Progress Notes (Cosign Needed)
Cardiology: Dr. Aundra Dubin Nephrology: Dr Thurmond Butts Transplant MD: Dr Alfonse Spruce  HPI: 53 y.o. with history of CAD s/p anterior MI in 2010 and NSTEMI in 3/18 with DES to pRCA and mLAD and ischemic cardiomyopathy now s/p Heartmate 3 LVAD and ESRD presents for followup of CHF/LVAD.  Patient developed low output heart failure.  He was not deemed to be a good transplant candidate.  He was admitted in 10/18 and Heartmate 3 LVAD was placed.  He had baseline CKD stage 3 likely from diabetic nephropathy and CHF.  He developed peri-op AKI and ended up on dialysis.  No renal recovery to date.  St Jude ICD has been turned back on and ramp echo was done prior to discharge in 10/18, speed increased to 5600 rpm.   He was admitted in 11/18 with nausea/vomiting and abdominal pain.  This appears to have been due to diabetic gastroparesis (extensive GI evaluation). He had atrial flutter this admission that was converted to NSR with amiodarone.  He improved considerably with scheduled Reglan and erythromycin. He was re-admitted later in 11/18 with the same symptoms, suspect diabetic gastroparesis and was again treated with erythromycin.  He was not able to start domperidone due to prolonged QT interval. He had nausea again about a week ago but was able to manage at home with Phenergan suppositories.   In 2019 he had 4 admits for uncontrolled N/V in the setting of gastroparesis flare. Treated with erythromycin. He has been evaluated at [facility] by Dr. Derrill Kay (gastroparesis specialist).   Gastric emptying study, surprisingly, was near normal. However, electrogastrogram showed poor gastric accomodation/capacity, "bradygastria."  Frequent small meals were recommended. Also, early am nausea was thought to be possible GERD.     He was admitted in 2/19 for AV graft placement.  While in the hospital, we turned down his speed.  Since then, he has had considerably fewer PI events on HD days.    Admitted 6/26 - 03/19/18 for  emergent VAD exchange 03/01/18 in the setting of HM3 outflow graft kink. He required CVVHD post op, but was transtioned back to [facility] without problems. Course complicated by leukocytosis and fever. CT abd/chest negative for infection and blood cultures were negative. Post op pain well controlled withraretramadol. Able to tolerate diet. Evaluated by PT/OT and HH PT recommended for discharge. Ramp echo completed prior to discharge with speed optimization. Resumed81 mg ASA + coumadin with INR goal 2-3.  He was admitted in 9/19 for LUE AV fistula placement.  He was readmitted in 10/19 with his typical gastroparesis symptoms, unable to take po. The fistula later failed and was replaced with a graft.  However, the graft was nonfunctional and developed a hematoma at the surgical site.    He was admitted in 12/19 with nausea/vomiting from HD. Also with LDH increased to 427 in setting of extensive hematoma around his left arm AV graft.  He has had tingling in his left hand and left grip is weak.    Today he returns for LVAD/HF follow up. Overall feeling fine. Continues to have low flow alarms at the dialysis if to much fluid is removed.  Denies SOB/PND/Orthopnea. No bleeding problems. Appetite ok. No fever or chills. Taking all medications.   PMH: 1. Chronic systolic CHF: Ischemic cardiomyopathy. St Jude ICD.  - Echo (11/17) with EF 25-30%, moderate diastolic dysfunction, mild MR, mildly dilated RV with moderately decreased systolic function, peak RV-RA gradient 48 mmHg.  - Echo (3/18) with EF 25-30%, wall  motion abnormalities, normal RV size and systolic function.  - Admission 3/18 with cardiogenic shock and AKI, required milrinone gtt.  - Echo (8/18) with EF 20-25%, mild LVH, moderate LV dilation, mild MR.  - CPX (8/18) with peak VO2 12.4, VE/VCO2 slope 35, RER 1.05 => severe HR limitation.  - Heartmate 3 LVAD 10/18.  - Pump thrombosis in setting of HM3 outflow graft kink.  Pump exchange 6/19 for HM3.    2. CAD: Anterior MI 2005, had bifurcation stenting LAD/diagonal. Repeat cath 2010 with patent stents.  - NSTEMI 3/18: LHC with 99% ulcerated lesion proximal RCA, 95% mid LAD => PCI with DES to proximal RCA and mid LAD.  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  - Lower extremity venous dopplers (8/18): chronic DVT - V/Q scan (8/18): Normal, no PE.  4. Crohns disease: History of colectomy.  5. GERD 6. Diabetic gastroparesis: Admission 11/18 with abdominal pain, nausea/vomiting.  - [facility] workup 2019: Gastric emptying study near normal.  Negative breath test (no evidence for small bowel bacterial overgrowth).  Electrogastrogram showed poor gastric accomodation/capacity, "bradygastria."  7. HTN 8. Hyperlipidemia 9. Type II diabetes  10. ESRD: Post-op LVAD surgery.  He has had failed AV fistula and AV graft, now dialyzing via catheter . 11. Carotid dopplers (8/18): mild bilateral stenosis.  12. PFTs (8/18): Mild restriction (no IDL on CT chest).  13. Lung nodules: CT chest 8/18 with small bilateral nodules.  14. PAD: ABIs with moderate bilateral disease in 8/18.  15. Atrial flutter: In 11/18 associated with nausea/vomiting from gastroparesis.   Current Outpatient Medications  Medication Sig Dispense Refill  . amiodarone (PACERONE) 200 MG tablet Take 200 mg by mouth daily.    [name] aspirin EC 81 MG tablet Take 1 tablet (81 mg total) by mouth daily. 90 tablet 3  . atorvastatin (LIPITOR) 40 MG tablet Take 1 tablet (40 mg total) by mouth daily at 6 PM. 30 tablet 6  . calcitRIOL (ROCALTROL) 0.5 MCG capsule Take 1 capsule (0.5 mcg total) by mouth every Monday, Wednesday, and Friday. 15 capsule 5  . citalopram (CELEXA) 20 MG tablet Take 1 tablet (20 mg total) by mouth daily. 30 tablet 6  . dronabinol (MARINOL) 2.5 MG capsule Take 1 capsule (2.5 mg total) by mouth 2 (two) times daily before lunch and supper. 60 capsule 5  . erythromycin (E-MYCIN) 250 MG tablet Take 1 tablet (250 mg total) by mouth 3 (three) times  daily. 90 tablet 4  . insulin aspart (NOVOLOG) 100 UNIT/ML injection Inject 5 Units into the skin 3 (three) times daily with meals. 1 vial 0  . insulin glargine (LANTUS) 100 UNIT/ML injection Inject 0.1 mLs (10 Units total) into the skin daily. (Patient taking differently: Inject 5 Units into the skin daily. ) 10 mL 5  . isosorbide-hydrALAZINE (BIDIL) 20-37.5 MG tablet Take 1.5 tablets by mouth every 8 (eight) hours. Take 1.5 tablets every 8 hours on non dialysis days. (Patient taking differently: Take 1 tablet by mouth every 8 (eight) hours. Take 1 tablets every 8 hours on non dialysis days.) 135 tablet 6  . lidocaine-prilocaine (EMLA) cream Apply 1 application topically as needed (to numb port).    . LORazepam (ATIVAN) 0.5 MG tablet Take 1 tablet (0.5 mg total) by mouth every 12 (twelve) hours as needed (nausea). 60 tablet 2  . metoCLOPramide (REGLAN) 10 MG tablet Take 1 tablet (10 mg total) by mouth 3 (three) times daily. 90 tablet 6  . multivitamin (RENA-VIT) TABS tablet Take 1 tablet  by mouth daily.    . pantoprazole (PROTONIX) 40 MG tablet Take 40 mg by mouth 2 (two) times daily.     . promethazine (PHENERGAN) 12.5 MG tablet Take 1 tablet (12.5 mg total) by mouth every 6 (six) hours as needed for nausea or vomiting. 30 tablet 3  . sevelamer carbonate (RENVELA) 800 MG tablet Take 1,600-4,000 mg by mouth See admin instructions. 4028m three times daily with meals and 16090mtwice daily with snacks    . warfarin (COUMADIN) 5 MG tablet TAKE AS FOLLOWS: MON-WED-FRI 1&1/2 TABLETS DAILY. ON ALL OTHER DAYS 1 TABLET (Patient taking differently: TAKE AS FOLLOWS: Tue and FRI 1&1/2 TABLETS DAILY. ON ALL OTHER DAYS 1 TABLET) 50 tablet 6  . docusate sodium (COLACE) 100 MG capsule Take 100 mg by mouth daily as needed for mild constipation.    . traMADol (ULTRAM) 50 MG tablet Take 1 tablet (50 mg total) by mouth every 6 (six) hours as needed for moderate pain. (Patient not taking: Reported on 09/13/2018) 30 tablet  0   No current facility-administered medications for this encounter.     Metformin and related   Review of systems complete and found to be negative unless listed in HPI.    BP 109/66   Pulse 93   Wt 87.1 kg (192 lb)   SpO2 100%   BMI 30.07 kg/m  MAP 102   VAD interrogation & Equipment Management: Flow 4 Speed 5500 Power 4.4  PI 5.4   6 PI today 100 PI events 11/12/18   Physical Exam: GENERAL: NAD. Walked in the clinic.  HEENT: normal  NECK: Supple, JVP  7-8.  2+ bilaterally, no bruits.  No lymphadenopathy or thyromegaly appreciated.   CARDIAC:  Mechanical heart sounds with LVAD hum present. L upper chest HD catheter.  LUNGS:  Clear to auscultation bilaterally.  ABDOMEN:  Soft, round, nontender, positive bowel sounds x4.     LVAD exit site: well-healed and incorporated.  Dressing dry and intact.  No erythema or drainage.  Stabilization device present and accurately applied.  Driveline dressing is being changed daily per sterile technique. EXTREMITIES:  Warm and dry, no cyanosis, clubbing, rash or edema  NEUROLOGIC:  Alert and oriented x 4.  Gait steady.  No aphasia.  No dysarthria.  Affect pleasant.      ASSESSMENT AND PLAN: 1. Chronic systolic CHF: Ischemic cardiomyopathy.  St Jude ICD.  NSTEMI in March 2018 with DES to LAD and RCA, complicated by cardiogenic shock, low output requiring milrinone.  Echo 8/18 with EF 20-25%, moderate MR.  CPX (8/18) with severe functional impairment due to HF.  He is s/p HM3 LVAD placement. He had pump [facility].  He had pump exchange for another HM3.   - Volume status managed at dialysis.  - Continue bidil 1 tab tid on non dialysis day.  - Continue ASA 81.  Continue warfarin INR 2-3.    - [facility] and recently saw Dr DeMosetta Pigeon 2. ESRD: Developed peri-operatively after HM3 placement.  He is tolerating HD, currently via catheter as he has  failed AV fistula and AV graft placement.  - He is currently looking for a living donor.  3. CAD: NSTEMI in 3/18. LHC with 99% ulcerated lesion proximal RCA with left to right collaterals, 95% mid LAD stenosis after mid LAD stent. s/p PCI to RCA and LAD on 11/25/16.   -Continue statin and asa.  4. h/o DVTs: Factor V Leiden heterozygote. - On coumadin. 5. Diabetic gastroparesis: Difficult to manage. He has seen gastroparesis specialist at [facility]. Surprisingly, gastric emptying study was normal.  However, electrogastrogram showed poor gastric accomodation/capacity.  Therefore, small frequent meals recommended.  Also morning nausea thought to be GERD and Protonix was increased.   -Nausea controlled.  - Continue reglan 5 mg tid/ac and Marinol.  - Continue Protonix bid.  - He will continue erythromycin indefinitely .  6. DM: HgbA1c well-controlled recently.   7. HTN:   Maps ok and have been lower during dialysis. For now we will continue bidil 1 tabl three times a day.    8. PAD: Moderate bilateral disease on ABIs in 8/18.  Denies claudication currently, no pedal ulcers.  9. Atrial flutter: Paroxysmal.  He has been in NSR recently.   - Continue amiodarone.   - Check TSH. He will need a regular eye exam.  10. Left hand tingling and grip weakness: Present since he developed left arm hematoma from failed AV graft.  Ongoing issues. Evaluated by OT. No new recommendations.     Follow up in 2 months   Darrick Grinder, NP  11/13/2018

## 2018-11-13 NOTE — Progress Notes (Signed)
Patient presents for hospital  follow up in [facility] today with his friend Gerald Stabs. Reports no problems with VAD equipment or concerns with drive line.   He is using his cane today and ambulating on his own. Reports his overall stamina is improving. Says his appetite has improved and is fair to good.  Pt has been receiving OT for his left hand. Pt states that this may have helped slightly but reports that he still has very limited use of his left hand was unable to indendpently change his batteries today.  Denies any major N&V; remains on erythromycin 250 mg tid. He also is taking his marinol and reglan as ordered and Ativan one time daily. He denies holding any meds, including warfarin.  He reports that he did have a low flow at the dialysis center the other day but the tech gave him a little fluid and he felt much better and the problem resolved.  Vital Signs:  Doppler Pressure  102 Automatc BP:  109/66 (93) HR:  93 SPO2: 100%  Weight: 192 lb w/o eqt Last weight: 180.2 lb   VAD Indication: Destination therapy- Pt is listed for heart/kidney at [facility] status 4  VAD interrogation & Equipment Management: Speed:5500 Flow: 4.0 Power: 4.4w    PI: 5.4  Alarms: none Events: 6 today; 100+ yesterday during dialysis  Fixed speed 5500 Low speed limit: 5200  Primary Controller:  Replace back up battery in 22 months. Back up controller:   Replace back up battery in 22 months.  Annual Equipment Maintenance on UBC/PM was performed on 06/2017.   I reviewed the LVAD parameters from today and compared the results to the patient's prior recorded data. LVAD interrogation was NEGATIVE for significant power changes, NEGATIVE for clinical alarms and STABLE for PI events/speed drops. No programming changes were made and pump is functioning within specified parameters. Pt is performing daily controller and system monitor self tests along with completing weekly and monthly maintenance for LVAD  equipment.  LVAD equipment check completed and is in good working order. Back-up equipment present.    Exit Site Care: Drive line is being maintained weekly by daughter. CDI today. Anchor secure. Pt provided with 9 weekly kits today. Pt asked me to change dressing today as he states that he has a large scab.  Existing VAD dressing removed and site care performed using sterile technique. Drive line exit site cleaned with Chlora prep applicators x 2, allowed to dry, and Sorbaview dressing with bio patch re-applied. Exit site healed and incorporated, the velour is fully implanted at exit site. No redness, tenderness, drainage, foul odor or rash noted. There is a large scab that was partially removed during cleaning pt reports that his daughter has been using peroxide on the driveline. Pt was instructed no to use to peroxide as this may be causing the scabs to keep forming. Drive line anchor re-applied. Pt denies fever or chills.    Significant Events on VAD Support: -10/2016>> poss drive line infection, CT ABD neg, ID consult-doxy -11/09/16>> admit for poss drive line infection, IV abx -03/24/17>> doxy for poss drive line infection -05/04/17>> drive line debridement with wound-vac -06/2017>> drive line +proteus, IV abx, Bactrim x14 days  Device:St jude Dual Therapies: on 200 bpm Last check: 01/01/18 - 2 hrs Afl/Afib; EMI 04/02/18  BP & Labs: -LABS ARE PENDING AT DIALYSIS CENTER-SHOULD SEND TO [place] TOMORROW MAP 93  - Doppler is reflecting MAP  Hgb not done today; difficult venous access - No S/S  of bleeding. Specifically denies melena/BRBPR or nosebleeds.  LDH not done today, difficult venous access - established baseline of 245- 350. Denies tea-colored urine. No power elevations noted on interrogation.   Patient Instructions:  1. No change in medications. 2. Return to clinic in 2 months.  [name] RN [place] Coordinator   Office: [phone] 24/7 Emergency VAD Pager: [phone]

## 2018-11-15 ENCOUNTER — Ambulatory Visit: Payer: BLUE CROSS/BLUE SHIELD | Admitting: Occupational Therapy

## 2018-11-16 ENCOUNTER — Ambulatory Visit (HOSPITAL_COMMUNITY): Payer: Self-pay | Admitting: Pharmacist

## 2018-11-16 DIAGNOSIS — Z5181 Encounter for therapeutic drug level monitoring: Secondary | ICD-10-CM

## 2018-11-16 DIAGNOSIS — Z95811 Presence of heart assist device: Secondary | ICD-10-CM

## 2018-11-16 LAB — POCT INR: INR: 2.6 (ref 2–3)

## 2018-11-17 ENCOUNTER — Telehealth (HOSPITAL_COMMUNITY): Payer: Self-pay | Admitting: *Deleted

## 2018-11-17 NOTE — Telephone Encounter (Signed)
Pt called VAD pager to report one Low Flow alarm. Says he had one low flow yesterday after dialysis treatment as well. Reports he has drank 20-30 oz of fluid today. VAD parameters: speed 5500, flow 3.7, PI 8.4, power 4.1. He says he feels "okay" and denies any dizzy/lightheaded or syncopal episodes.  Per Dr. Aundra Dubin, informed pt to drink 20 - 40 additional oz this evening. If low flows continue or if he starts feeling bad to call VAD pager. Pt verbalized understanding of same.  Zada Girt RN, [place] Coordinator [phone]

## 2018-11-20 ENCOUNTER — Ambulatory Visit: Payer: BLUE CROSS/BLUE SHIELD | Admitting: Occupational Therapy

## 2018-11-20 ENCOUNTER — Telehealth (HOSPITAL_COMMUNITY): Payer: Self-pay | Admitting: Licensed Clinical Social Worker

## 2018-11-20 NOTE — Telephone Encounter (Signed)
CSW contacted patient to assure food, medications and confirmation of VAD pager number if concerns or emergency arise. Patient instructed to stay home and use of proper hygiene and call if needed.  Patient informed that VAD team will call the day before any upcoming appointments with instructions due to current CoVid 19 outbreak. Patient verbalizes understanding and denies any current concerns. Raquel Sarna, [place], [place]

## 2018-11-22 ENCOUNTER — Encounter: Payer: Self-pay | Admitting: Occupational Therapy

## 2018-11-26 ENCOUNTER — Telehealth: Payer: Self-pay | Admitting: Occupational Therapy

## 2018-11-26 ENCOUNTER — Encounter: Payer: Self-pay | Admitting: Occupational Therapy

## 2018-11-26 NOTE — Telephone Encounter (Signed)
Patient was contacted today regarding the temporary closing of OP Rehab Services due to Covid-19.  Therapist discussed:  Plans for d/c as pt has not been seen in several weeks and pt is medically compromised.  Patient is not interested in further information for an e-visit, virtual check in, or telehealth visit, if those services become available.    Pt agrees with plans for d/c at this time.   Theone Murdoch, OTR/L [phone] Phone: [phone] 10:30 AM 11/26/18 [facility] [address] [place], [place]  [address] Phone:  [phone] Fax:  [phone] \

## 2018-11-26 NOTE — Therapy (Signed)
[facility] [address] [place], [place], [address] Phone: [phone]   Fax:  [phone]  Patient Details  Name: [name] MRN: [id number] Date of Birth: 1966/05/16 Referring Provider:  No ref. provider found  Encounter Date: 11/26/2018 OCCUPATIONAL THERAPY DISCHARGE SUMMARY  Visits from Start of Care: 5  Current functional level related to goals / functional outcomes:Pt did not meet goals due to inconsistent attendance.  Pt did not return to therapy for re-assessment of goals.   Remaining deficits: Pain, decreased strength, decreased UE functional use, decreased ROM   Education / Equipment: Pt was educated in HEP and importance of  LUE functional use. Pt demonstrates understanding of all education, however education was not completed as pt did not return to therapy. Plan: Patient agrees to discharge.  Patient goals were not met. Patient is being discharged due to not returning since the last visit.  ?????      RINE,KATHRYN 11/26/2018, 10:35 AM  [facility] [address] [place], [place], [address] Phone: [phone]   Fax:  [phone]

## 2018-11-27 ENCOUNTER — Ambulatory Visit: Payer: BLUE CROSS/BLUE SHIELD | Admitting: Occupational Therapy

## 2018-11-29 ENCOUNTER — Ambulatory Visit: Payer: BLUE CROSS/BLUE SHIELD | Admitting: Occupational Therapy

## 2018-11-29 ENCOUNTER — Ambulatory Visit (HOSPITAL_COMMUNITY): Payer: Self-pay | Admitting: Pharmacist

## 2018-11-29 DIAGNOSIS — Z95811 Presence of heart assist device: Secondary | ICD-10-CM

## 2018-11-29 DIAGNOSIS — Z5181 Encounter for therapeutic drug level monitoring: Secondary | ICD-10-CM

## 2018-11-29 LAB — POCT INR: INR: 2.8 (ref 2.0–3.0)

## 2018-12-06 ENCOUNTER — Ambulatory Visit (HOSPITAL_COMMUNITY): Payer: Self-pay | Admitting: Pharmacist

## 2018-12-06 LAB — POCT INR: INR: 2 (ref 2–3)

## 2018-12-07 ENCOUNTER — Telehealth (HOSPITAL_COMMUNITY): Payer: Self-pay | Admitting: Licensed Clinical Social Worker

## 2018-12-07 NOTE — Telephone Encounter (Signed)
CSW contacted patient/caregiver to follow up on status with coronavirus and if anything needed. Patient instructed to stay home and use of proper hygiene and call if needed. Patient denies any concerns at this time and verbalizes understanding of process for contacting VAD Coordinators if needed. CSW continues to follow as needed. Raquel Sarna, [place], [place]

## 2018-12-14 ENCOUNTER — Ambulatory Visit (HOSPITAL_COMMUNITY): Payer: Self-pay | Admitting: Pharmacist

## 2018-12-14 DIAGNOSIS — Z5181 Encounter for therapeutic drug level monitoring: Secondary | ICD-10-CM

## 2018-12-14 DIAGNOSIS — Z95811 Presence of heart assist device: Secondary | ICD-10-CM

## 2018-12-14 LAB — POCT INR: INR: 2.7 (ref 2.0–3.0)

## 2018-12-21 ENCOUNTER — Ambulatory Visit (HOSPITAL_COMMUNITY): Payer: Self-pay | Admitting: Pharmacist

## 2018-12-21 DIAGNOSIS — Z5181 Encounter for therapeutic drug level monitoring: Secondary | ICD-10-CM

## 2018-12-21 DIAGNOSIS — Z95811 Presence of heart assist device: Secondary | ICD-10-CM

## 2018-12-21 LAB — POCT INR: INR: 3.1 — AB (ref 2.0–3.0)

## 2018-12-28 ENCOUNTER — Ambulatory Visit (HOSPITAL_COMMUNITY): Payer: Self-pay | Admitting: Pharmacist

## 2018-12-28 DIAGNOSIS — Z5181 Encounter for therapeutic drug level monitoring: Secondary | ICD-10-CM

## 2018-12-28 DIAGNOSIS — Z95811 Presence of heart assist device: Secondary | ICD-10-CM

## 2018-12-28 LAB — POCT INR: INR: 2.9 (ref 2.0–3.0)

## 2019-01-04 ENCOUNTER — Ambulatory Visit (HOSPITAL_COMMUNITY): Payer: Self-pay | Admitting: Pharmacist

## 2019-01-04 ENCOUNTER — Telehealth (HOSPITAL_COMMUNITY): Payer: Self-pay | Admitting: Licensed Clinical Social Worker

## 2019-01-04 LAB — POCT INR: INR: 2.6 (ref 2–3)

## 2019-01-04 NOTE — Telephone Encounter (Signed)
CSW contacted patient/caregiver to follow up on status with coronavirus and if anything needed. Patient instructed to stay home and use of proper hygiene and call if needed. CSW shared information about LVAD Support Group meeting virtually on May 4th and obtained email address if interested. Patient denies any concerns at this time and verbalizes understanding of process for contacting VAD Coordinators if needed. CSW continues to follow as needed. Raquel Sarna, [place], [place]

## 2019-01-08 ENCOUNTER — Ambulatory Visit: Payer: BLUE CROSS/BLUE SHIELD | Admitting: Podiatry

## 2019-01-08 ENCOUNTER — Ambulatory Visit: Payer: Self-pay | Admitting: Podiatry

## 2019-01-11 ENCOUNTER — Ambulatory Visit (HOSPITAL_COMMUNITY): Payer: Self-pay | Admitting: Pharmacist

## 2019-01-11 DIAGNOSIS — Z95811 Presence of heart assist device: Secondary | ICD-10-CM

## 2019-01-11 DIAGNOSIS — Z5181 Encounter for therapeutic drug level monitoring: Secondary | ICD-10-CM

## 2019-01-11 LAB — POCT INR: INR: 3.1 — AB (ref 2.0–3.0)

## 2019-01-15 ENCOUNTER — Inpatient Hospital Stay (HOSPITAL_COMMUNITY): Admission: RE | Admit: 2019-01-15 | Payer: Self-pay | Source: Ambulatory Visit

## 2019-01-16 ENCOUNTER — Other Ambulatory Visit: Payer: Self-pay

## 2019-01-16 ENCOUNTER — Ambulatory Visit (HOSPITAL_COMMUNITY)
Admission: RE | Admit: 2019-01-16 | Discharge: 2019-01-16 | Disposition: A | Discharge status: Home / Self Care | Payer: Self-pay | Source: Ambulatory Visit | Attending: Adult Health | Admitting: Adult Health

## 2019-01-16 VITALS — Wt 180.8 lb

## 2019-01-16 DIAGNOSIS — K3184 Gastroparesis: Secondary | ICD-10-CM

## 2019-01-16 DIAGNOSIS — Z992 Dependence on renal dialysis: Secondary | ICD-10-CM

## 2019-01-16 DIAGNOSIS — I5022 Chronic systolic (congestive) heart failure: Secondary | ICD-10-CM

## 2019-01-16 DIAGNOSIS — Z95811 Presence of heart assist device: Secondary | ICD-10-CM

## 2019-01-16 DIAGNOSIS — N186 End stage renal disease: Secondary | ICD-10-CM

## 2019-01-16 NOTE — Progress Notes (Signed)
Heart Failure TeleHealth Note  Due to national recommendations of social distancing due to [place] 19, Audio/video telehealth visit is felt to be most appropriate for this patient at this time.  See MyChart message from today for patient consent regarding telehealth for [name].  Date:  01/16/2019   ID:  STEELE STRACENER, DOB [birth year], MRN [id number]  Location: Home  Provider location: [facility] Type of Visit: Established patient   PCP:  Lovenia Kim, MD  Cardiologist:  No primary care provider on file. Primary HF: Dr Aundra Dubin   Chief Complaint: HF/LVAD   History of Present Illness: [name] is a 53 y.o. male with a history of CAD s/p anterior MI in 2010 and NSTEMI in 3/18 with DES to pRCA and mLAD and ischemic cardiomyopathy now s/p Heartmate 3 LVAD and ESRD presents for followup of CHF/LVAD.  Patient developed low output heart failure.  He was not deemed to be a good transplant candidate.  He was admitted in 10/18 and Heartmate 3 LVAD was placed.  He had baseline CKD stage 3 likely from diabetic nephropathy and CHF.  He developed peri-op AKI and ended up on dialysis.  No renal recovery to date.  St Jude ICD has been turned back on and ramp echo was done prior to discharge in 10/18, speed increased to 5600 rpm.   GI issues Admitted 07/2017 and 4 times in 2019 for gastroparesis. Evaluated at [facility] by Dr. Derrill Kay (gastroparesis specialist).  Gastric emptying study, surprisingly, was near normal. However, electrogastrogram showed poor gastric accomodation/capacity, "bradygastria."  Frequent small meals were recommended.   LVAD Issues  Pump exchange 03/01/2018 for outflow graft kind.   Elevated LDH 08/2018     He presents via audio/video conferencing for a telehealth visit today.   Overall feeling fine. He remains on dialysis 3 days a week. He has had some low flow alarms at dialysis. Says they will give him back fluid and this seems to help.  Today he says Dr Jimmy Footman increased his dry weight to 82.5 kilogram.  Denies SOB/PND/Orthopnea. NO BRBPR. Appetite ok. No fever or chills. Denies N/V. Driveline ok. No issues with driveline. Weight at home 180  pounds. Taking all medications.  he denies symptoms worrisome for COVID 19.   LVAD Interrogation HM 3.  Speed: 5500 Flow: 3.5 PI: 3.2 Power: 4.3 He has low flow alarms at dialysis every now and then.   Past Medical History:  Diagnosis Date  . Angina   . ASCVD (arteriosclerotic cardiovascular disease)    , Anterior infarction 2005, LAD diagonal bifurcation intervention 03/2004  . Automatic implantable cardiac defibrillator -St. Jude's       . Benign neoplasm of colon   . CHF (congestive heart failure) ([place])   . Chronic systolic heart failure ([place])   . Coronary artery disease     Widely patent previously placed stents in the left anterior   . Crohn's disease ([place])   . 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 Dialysis patient ([place])   . Dyspnea   . Gastroparesis   . GERD (gastroesophageal reflux disease)   . High cholesterol   . Hyperlipidemia   . Hypersomnolent    Previous diagnosis of narcolepsy  . Hypertension, essential   . Ischemic cardiomyopathy    Ejection fraction 15-20% catheterization 2010  . Type II or unspecified type diabetes mellitus without mention of complication, not stated as uncontrolled   . Unspecified gastritis  and gastroduodenitis without mention of hemorrhage    Past Surgical History:  Procedure Laterality Date  . A/V SHUNTOGRAM N/A 05/24/2018   Procedure: A/V SHUNTOGRAM;  Surgeon: Marty Heck, MD;  Location: [place] [facility];  Service: Cardiovascular;  Laterality: N/A;  . APPENDECTOMY    . AV FISTULA PLACEMENT Right 06/26/2017   Procedure: ARTERIOVENOUS (AV) FISTULA CREATION VERSUS GRAFT RIGHT  ARM;  Surgeon: Conrad Clifton, MD;  Location: [place];  Service: Vascular;  Laterality: Right;  . AV FISTULA PLACEMENT Right  10/31/2017   Procedure: INSERTION OF ARTERIOVENOUS (AV) ARTEGRAFT,  RIGHT UPPER ARM;  Surgeon: Conrad Wacissa, MD;  Location: [place];  Service: Vascular;  Laterality: Right;  . AV FISTULA PLACEMENT Left 05/29/2018   Procedure: ARTERIOVENOUS (AV) FISTULA CREATION  LEFT UPPER EXTREMITY;  Surgeon: Waynetta Sandy, MD;  Location: [place];  Service: Vascular;  Laterality: Left;  . AV FISTULA PLACEMENT Left 08/09/2018   Procedure: INSERTION OF ARTERIOVENOUS (AV) GORE-TEX GRAFT LEFT UPPER ARM;  Surgeon: Waynetta Sandy, MD;  Location: [place];  Service: Vascular;  Laterality: Left;  . CARDIAC DEFIBRILLATOR PLACEMENT  2010   St. Jude ICD  . CENTRAL LINE INSERTION Right 05/24/2018   Procedure: CENTRAL LINE INSERTION;  Surgeon: Marty Heck, MD;  Location: [place] [facility];  Service: Cardiovascular;  Laterality: Right;  . COLECTOMY  ~ 2003   "for Crohn's" ascending colon.    . CORONARY STENT INTERVENTION N/A 11/25/2016   Procedure: Coronary Stent Intervention;  Surgeon: Leonie Man, MD;  Location: [place] [facility];  Service: Cardiovascular;  Laterality: N/A;  . EP IMPLANTABLE DEVICE N/A 09/14/2016   Procedure: ICD Generator Changeout;  Surgeon: Deboraha Sprang, MD;  Location: [place] [facility];  Service: Cardiovascular;  Laterality: N/A;  . ESOPHAGOGASTRODUODENOSCOPY N/A 07/12/2017   Procedure: ESOPHAGOGASTRODUODENOSCOPY (EGD);  Surgeon: Jerene Bears, MD;  Location: [facility] ENDOSCOPY;  Service: Gastroenterology;  Laterality: N/A;  VAD pt so VAD team will need to accompany pt.   [name] FETAL SURGERY FOR CONGENITAL HERNIA     ????? pt has no knowledge of this..    . FISTULOGRAM Left 08/09/2018   Procedure: FISTULOGRAM LEFT ARM;  Surgeon: Waynetta Sandy, MD;  Location: [place];  Service: Vascular;  Laterality: Left;  . INGUINAL HERNIA REPAIR    . INSERTION OF DIALYSIS CATHETER Right 06/26/2017   Procedure: INSERTION OF TUNNELED  DIALYSIS CATHETER RIGHT INTERNAL JUGULAR;  Surgeon:  Conrad Linneus, MD;  Location: [place];  Service: Vascular;  Laterality: Right;  . INSERTION OF IMPLANTABLE LEFT VENTRICULAR ASSIST DEVICE N/A 06/12/2017   Procedure: INSERTION OF IMPLANTABLE LEFT VENTRICULAR ASSIST [place] 3;  Surgeon: Ivin Poot, MD;  Location: [place];  Service: Open Heart Surgery;  Laterality: N/A;  HM3 LVAD  CIRC ARREST  NITRIC OXIDE  . INSERTION OF IMPLANTABLE LEFT VENTRICULAR ASSIST DEVICE N/A 02/28/2018   Procedure: REDO INSERTION OF IMPLANTABLE LEFT VENTRICULAR ASSIST DEVICE - HEARTMATE 3;  Surgeon: Ivin Poot, MD;  Location: [place];  Service: Open Heart Surgery;  Laterality: N/A;  . IR REMOVAL TUN CV CATH W/O FL  02/26/2018  . IR THORACENTESIS ASP PLEURAL SPACE W/IMG GUIDE  06/28/2017  . LIGATION OF ARTERIOVENOUS  FISTULA Right 10/31/2017   Procedure: LIGATION OF BRACHIO-BASILIC VEIN TRANSPOSITION RIGHT ARM;  Surgeon: Conrad [place] Village, MD;  Location: [place];  Service: Vascular;  Laterality: Right;  . MULTIPLE EXTRACTIONS WITH ALVEOLOPLASTY N/A 06/08/2017   Procedure: MULTIPLE EXTRACTION  WITH ALVEOLOPLASTY AND PRE PROSTHETIC SURGERY AS NEEDED;  Surgeon: Lenn Cal, DDS;  Location: [place];  Service: Oral Surgery;  Laterality: N/A;  . RIGHT HEART CATH N/A 06/07/2017   Procedure: RIGHT HEART CATH;  Surgeon: Larey Dresser, MD;  Location: [place] [facility];  Service: Cardiovascular;  Laterality: N/A;  . RIGHT HEART CATH N/A 02/08/2018   Procedure: RIGHT HEART CATH;  Surgeon: Larey Dresser, MD;  Location: [place] [facility];  Service: Cardiovascular;  Laterality: N/A;  . RIGHT HEART CATH N/A 08/09/2018   Procedure: RIGHT HEART CATH;  Surgeon: Larey Dresser, MD;  Location: [place] [facility];  Service: Cardiovascular;  Laterality: N/A;  . RIGHT/LEFT HEART CATH AND CORONARY ANGIOGRAPHY N/A 11/23/2016   Procedure: Right/Left Heart Cath and Coronary Angiography;  Surgeon: Larey Dresser, MD;  Location: [place] [facility];  Service: Cardiovascular;  Laterality:  N/A;  . TEE WITHOUT CARDIOVERSION N/A 06/12/2017   Procedure: TRANSESOPHAGEAL ECHOCARDIOGRAM (TEE);  Surgeon: Prescott Gum, Collier Salina, MD;  Location: [place];  Service: Open Heart Surgery;  Laterality: N/A;  . TEE WITHOUT CARDIOVERSION N/A 02/28/2018   Procedure: TRANSESOPHAGEAL ECHOCARDIOGRAM (TEE);  Surgeon: Prescott Gum, Collier Salina, MD;  Location: [place];  Service: Open Heart Surgery;  Laterality: N/A;  . TEMPORARY DIALYSIS CATHETER Left 05/24/2018   Procedure: TEMPORARY DIALYSIS CATHETER;  Surgeon: Marty Heck, MD;  Location: [place] [facility];  Service: Cardiovascular;  Laterality: Left;  . THROMBECTOMY AND REVISION OF ARTERIOVENTOUS (AV) GORETEX  GRAFT Right 12/12/2017   Procedure: THROMBECTOMY ARTERIOVENTOUS (AV) GORETEX  GRAFT RIGHT UPPER ARM;  Surgeon: Conrad Maurice, MD;  Location: [place];  Service: Vascular;  Laterality: Right;     Current Outpatient Medications  Medication Sig Dispense Refill  . amiodarone (PACERONE) 200 MG tablet Take 200 mg by mouth daily.    [name] aspirin EC 81 MG tablet Take 1 tablet (81 mg total) by mouth daily. 90 tablet 3  . atorvastatin (LIPITOR) 40 MG tablet Take 1 tablet (40 mg total) by mouth daily at 6 PM. 30 tablet 6  . calcitRIOL (ROCALTROL) 0.5 MCG capsule Take 1 capsule (0.5 mcg total) by mouth every Monday, Wednesday, and Friday. 15 capsule 5  . citalopram (CELEXA) 20 MG tablet Take 1 tablet (20 mg total) by mouth daily. 30 tablet 6  . docusate sodium (COLACE) 100 MG capsule Take 100 mg by mouth daily as needed for mild constipation.    [name] dronabinol (MARINOL) 2.5 MG capsule Take 1 capsule (2.5 mg total) by mouth 2 (two) times daily before lunch and supper. 60 capsule 5  . erythromycin (E-MYCIN) 250 MG tablet Take 1 tablet (250 mg total) by mouth 3 (three) times daily. 90 tablet 4  . insulin aspart (NOVOLOG) 100 UNIT/ML injection Inject 5 Units into the skin 3 (three) times daily with meals. 1 vial 0  . insulin glargine (LANTUS) 100 UNIT/ML injection Inject 0.1 mLs  (10 Units total) into the skin daily. (Patient taking differently: Inject 5 Units into the skin daily. ) 10 mL 5  . isosorbide-hydrALAZINE (BIDIL) 20-37.5 MG tablet Take 1.5 tablets by mouth every 8 (eight) hours. Take 1.5 tablets every 8 hours on non dialysis days. (Patient taking differently: Take 1 tablet by mouth every 8 (eight) hours. Take 1 tablets every 8 hours on non dialysis days.) 135 tablet 6  . lidocaine-prilocaine (EMLA) cream Apply 1 application topically as needed (to numb port).    . LORazepam (ATIVAN) 0.5 MG tablet Take  1 tablet (0.5 mg total) by mouth every 12 (twelve) hours as needed (nausea). 60 tablet 2  . metoCLOPramide (REGLAN) 10 MG tablet Take 1 tablet (10 mg total) by mouth 3 (three) times daily. 90 tablet 6  . multivitamin (RENA-VIT) TABS tablet Take 1 tablet by mouth daily.    . pantoprazole (PROTONIX) 40 MG tablet Take 40 mg by mouth 2 (two) times daily.     . promethazine (PHENERGAN) 12.5 MG tablet Take 1 tablet (12.5 mg total) by mouth every 6 (six) hours as needed for nausea or vomiting. 30 tablet 3  . sevelamer carbonate (RENVELA) 800 MG tablet Take 1,600-4,000 mg by mouth See admin instructions. 4079m three times daily with meals and 16088mtwice daily with snacks    . traMADol (ULTRAM) 50 MG tablet Take 1 tablet (50 mg total) by mouth every 6 (six) hours as needed for moderate pain. (Patient not taking: Reported on 09/13/2018) 30 tablet 0  . warfarin (COUMADIN) 5 MG tablet TAKE AS FOLLOWS: MON-WED-FRI 1&1/2 TABLETS DAILY. ON ALL OTHER DAYS 1 TABLET (Patient taking differently: TAKE AS FOLLOWS: Tue and FRI 1&1/2 TABLETS DAILY. ON ALL OTHER DAYS 1 TABLET) 50 tablet 6   No current facility-administered medications for this encounter.     Allergies:   Metformin and related   Social History:  The patient  reports that he has never smoked. He has never used smokeless tobacco. He reports that he does not drink alcohol or use drugs.   Family History:  The patient's family  history includes Colitis in his sister and sister; Colon polyps in his mother; Diabetes in his father and mother; Heart attack in his father; Heart disease in his brother; Stroke in his paternal grandfather.   ROS:  Please see the history of present illness.   All other systems are personally reviewed and negative.   Exam:  Tele Health Call; Exam is subjective  General:  Speaks in full sentences. No resp difficulty. Lungs: Normal respiratory effort with conversation.  Abdomen: Non-distended per patient report Extremities: Pt denies edema. Neuro: Alert & oriented x 3.   Recent Labs: 03/15/2018: B Natriuretic Peptide 811.3 08/09/2018: Magnesium 2.8 08/28/2018: ALT 21 09/03/2018: BUN 60; Creatinine, Ser 11.36; Hemoglobin 7.7; Platelets 264; Potassium 4.1; Sodium 131  Personally reviewed   Wt Readings from Last 3 Encounters:  11/13/18 87.1 kg (192 lb)  11/02/18 83 kg (182 lb 15.7 oz)  09/13/18 81.7 kg (180 lb 3.2 oz)      ASSESSMENT AND PLAN:   1. Chronic systolic CHF: Ischemic cardiomyopathy. St Jude ICD. NSTEMI in March 2018 with DES to LAD and RCA, complicated by cardiogenic shock, low output requiring milrinone. Echo 8/18 with EF 20-25%, moderate MR. CPX (8/18) with severe functional impairment due to HF. He is s/p HM3 LVAD placement. He had [facility]  He had pump exchange for another HM3.   - I reviewed VAD numbers he reported with him. This seems stable. INR check at dialysis. - Volume status managed at dialysis.  - Continue bidil 1 tab tid on non dialysis day.  - Continue ASA 81.  Continue warfarin INR goal 2-3.    - [facility] recently saw Dr DeMosetta Pigeon 2. ESRD: Developed peri-operatively after HM3 placement.  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  Dry weight adjusted to 82.5 kg per  Dr Deterding due to low  flows.  - He is currently looking for a living donor.  3. CAD: NSTEMI in 3/18. LHC with 99% ulcerated lesion proximal RCA with left to right collaterals, 95% mid LAD stenosis after mid LAD stent. s/p PCI to RCA and LAD on 11/25/16.  -Continue statin and asa.  4. h/o DVTs: Factor V Leiden heterozygote. - On coumadin. 5. Diabetic gastroparesis: Difficult to manage. He has seen gastroparesis specialist at [name]. Surprisingly, gastric emptying study was normal.  However, electrogastrogram showed poor gastric accomodation/capacity.  Therefore, small frequent meals recommended.  Also morning nausea thought to be GERD and - Continue protonix, reglan, and marinol. - He will continue erythromycin indefinitely  - Controlled with the above. .  6. DM: HgbA1c well-controlled recently.   7. HTN:   Continue current HF meds.  8. PAD: Moderate bilateral disease on ABIs in 8/18.  Denies claudication currently, no pedal ulcers.  9. Atrial flutter: Paroxysmal.  He has been in NSR recently.   - Continue amiodarone.   - He will need a regular eye exam.  10. Left hand tingling and grip weakness: Present since he developed left arm hematoma from failed AV graft.  Still having grip issues. Outpatient OT on hold due to Covid 19.    COVID screen The patient does not have any symptoms that suggest any further testing/ screening at this time.  Social distancing reinforced today.  Patient Risk: After full review of this patients clinical status, I feel that they are at moderate risk for cardiac decompensation at this time.  Relevant cardiac medications were reviewed at length with the patient today. The patient does not have concerns regarding their medications at this time.   The following changes were made today:  None  Recommended follow-up:  Follow up in 2 months.   Today, I have spent 21 minutes with the patient with telehealth technology discussing the above issues .    Jeanmarie Hubert, NP   01/16/2019 11:23 AM  [place] [address] [facility] [place] [address] [phone] (office) [phone] (fax)

## 2019-01-18 ENCOUNTER — Ambulatory Visit (HOSPITAL_COMMUNITY): Payer: Self-pay | Admitting: Pharmacist

## 2019-01-18 DIAGNOSIS — Z95811 Presence of heart assist device: Secondary | ICD-10-CM

## 2019-01-18 DIAGNOSIS — Z5181 Encounter for therapeutic drug level monitoring: Secondary | ICD-10-CM

## 2019-01-18 LAB — POCT INR: INR: 2.8 (ref 2.0–3.0)

## 2019-01-25 ENCOUNTER — Ambulatory Visit (HOSPITAL_COMMUNITY): Payer: Self-pay | Admitting: Pharmacist

## 2019-01-25 LAB — POCT INR: INR: 2.7 (ref 2–3)

## 2019-01-29 ENCOUNTER — Telehealth: Payer: Self-pay

## 2019-01-29 NOTE — Telephone Encounter (Signed)
I called and left patient a message about setting up telehealth visit with Dr. Caryl Comes, patient is on recall list and Dr. Caryl Comes has several openings tomorrow.

## 2019-01-31 ENCOUNTER — Telehealth: Payer: Self-pay | Admitting: Cardiology

## 2019-01-31 NOTE — Telephone Encounter (Signed)
No arrhythmias noted correlating with his current complaints of intermittent chest pain. Previously had episodes of SVT vs AT in April, but nothing recent.    Legrand Como [address] [place], [place] 01/31/2019 2:58 PM

## 2019-01-31 NOTE — Telephone Encounter (Signed)
Sarah w/ LVAD clinic called she wants to know if pt remote transmission that he sent today shows any abnormal rhythm. Please call her back.

## 2019-01-31 NOTE — Telephone Encounter (Signed)
Discussed with LVAD coordinator.

## 2019-02-01 ENCOUNTER — Ambulatory Visit (HOSPITAL_COMMUNITY): Payer: Self-pay | Admitting: Pharmacist

## 2019-02-01 DIAGNOSIS — Z5181 Encounter for therapeutic drug level monitoring: Secondary | ICD-10-CM

## 2019-02-01 DIAGNOSIS — Z95811 Presence of heart assist device: Secondary | ICD-10-CM

## 2019-02-01 LAB — POCT INR: INR: 2.9 (ref 2.0–3.0)

## 2019-02-07 ENCOUNTER — Ambulatory Visit (HOSPITAL_COMMUNITY)
Admission: RE | Admit: 2019-02-07 | Discharge: 2019-02-07 | Disposition: A | Discharge status: Home / Self Care | Payer: Self-pay | Source: Ambulatory Visit | Attending: Internal Medicine | Admitting: Internal Medicine

## 2019-02-07 ENCOUNTER — Other Ambulatory Visit: Payer: Self-pay

## 2019-02-07 VITALS — BP 102/0

## 2019-02-07 DIAGNOSIS — Z4689 Encounter for fitting and adjustment of other specified devices: Secondary | ICD-10-CM | POA: Insufficient documentation

## 2019-02-07 DIAGNOSIS — Z95811 Presence of heart assist device: Secondary | ICD-10-CM

## 2019-02-07 MED ORDER — MIDODRINE HCL 5 MG PO TABS: 5.0000 mg | ORAL_TABLET | Freq: Every day | ORAL | 6 refills | Status: DC

## 2019-02-07 NOTE — Progress Notes (Signed)
Patient presents for BP check in VAD Clinic today with his friend Gerald Stabs. Reports no problems with VAD equipment or concerns with drive line.   Pt states that he hasn't been able to complete a dialysis treatment this week due to BP issues. Pt states that his kidney doctor increased his dry wt. Because they have to keep giving fluid back to when he is nearing the end of his treatment.  Vital Signs:  Doppler Pressure  102 Automatc BP:  111/62 (83) HR:  93 SPO2: 100%  Weight: 192 lb w/o eqt Last weight: 180.2 lb   VAD Indication: Destination therapy- Pt is listed for heart/kidney at [facility] status 4  VAD interrogation & Equipment Management: Speed: 5500 Flow: 3.6 Power: 4.5w    PI: 6.1  Alarms: none Events: 10-30 daily  Fixed speed 5500 Low speed limit: 5200  Primary Controller:  Replace back up battery in 22 months. Back up controller:   Replace back up battery in 22 months.  Annual Equipment Maintenance on UBC/PM was performed on 06/2017.   I reviewed the LVAD parameters from today and compared the results to the patient's prior recorded data. LVAD interrogation was NEGATIVE for significant power changes, NEGATIVE for clinical alarms and STABLE for PI events/speed drops. No programming changes were made and pump is functioning within specified parameters. Pt is performing daily controller and system monitor self tests along with completing weekly and monthly maintenance for LVAD equipment.  LVAD equipment check completed and is in good working order. Back-up equipment present.    Exit Site Care: Drive line is being maintained weekly by daughter. CDI today. Anchor secure. Pt provided with 15 weekly kits today. Exit site healed and incorporated, the velour is fully implanted at exit site. No redness, tenderness, drainage, foul odor or rash noted.  Drive line anchor secure. Pt denies fever or chills.    Significant Events on VAD Support: -10/2016>> poss drive line  infection, CT ABD neg, ID consult-doxy -11/09/16>> admit for poss drive line infection, IV abx -03/24/17>> doxy for poss drive line infection -05/04/17>> drive line debridement with wound-vac -06/2017>> drive line +proteus, IV abx, Bactrim x14 days  Device:St jude Dual Therapies: on 200 bpm Last check: 01/01/18 - 2 hrs Afl/Afib; EMI 04/02/18  BP & Labs: -  MAP 83  - Doppler is reflecting Modified systolic  Hgb not done today; difficult venous access - No S/S of bleeding. Specifically denies melena/BRBPR or nosebleeds.  LDH not done today, difficult venous access - established baseline of 245- 350. Denies tea-colored urine. No power elevations noted on interrogation.   Patient Instructions:  1. STOP Bidil per Dr. Aundra Dubin 2. Start Midodrine 5 mg on the mornings before dialysis treatment per Dr. Aundra Dubin 3. Return to clinic at your regularly scheduled appt in July.  [name] RN [place] Coordinator   Office: [phone] 24/7 Emergency VAD Pager: [phone]

## 2019-02-07 NOTE — Patient Instructions (Signed)
1. STOP Bidil. 2. Start Midodrine 5 mg on the mornings before dialysis treatment.

## 2019-02-08 ENCOUNTER — Ambulatory Visit (HOSPITAL_COMMUNITY): Payer: Self-pay | Admitting: Pharmacist

## 2019-02-08 DIAGNOSIS — Z5181 Encounter for therapeutic drug level monitoring: Secondary | ICD-10-CM

## 2019-02-08 DIAGNOSIS — Z95811 Presence of heart assist device: Secondary | ICD-10-CM

## 2019-02-08 LAB — POCT INR: INR: 2.9 (ref 2.0–3.0)

## 2019-02-08 NOTE — Telephone Encounter (Signed)
Patient did not call back and schedule follow up appointment.

## 2019-02-15 ENCOUNTER — Ambulatory Visit (HOSPITAL_COMMUNITY): Payer: Self-pay | Admitting: Pharmacist

## 2019-02-15 DIAGNOSIS — Z5181 Encounter for therapeutic drug level monitoring: Secondary | ICD-10-CM

## 2019-02-15 DIAGNOSIS — Z95811 Presence of heart assist device: Secondary | ICD-10-CM

## 2019-02-15 LAB — POCT INR: INR: 2.7 (ref 2.0–3.0)

## 2019-02-22 ENCOUNTER — Ambulatory Visit (HOSPITAL_COMMUNITY): Payer: Self-pay | Admitting: Pharmacist

## 2019-02-22 DIAGNOSIS — Z5181 Encounter for therapeutic drug level monitoring: Secondary | ICD-10-CM

## 2019-02-22 DIAGNOSIS — Z95811 Presence of heart assist device: Secondary | ICD-10-CM

## 2019-02-22 DIAGNOSIS — I82599 Chronic embolism and thrombosis of other specified deep vein of unspecified lower extremity: Secondary | ICD-10-CM

## 2019-02-22 LAB — POCT INR: INR: 2.9 (ref 2.0–3.0)

## 2019-02-22 IMAGING — DX DG ABDOMEN 2V
3 series · 3 of 3 positions shown · non-contrast
Comparison: 11/17/2011 abdominal radiograph. 02/11/2008 CT abdomen
and pelvis.

CLINICAL DATA: Nausea and vomiting.

EXAM:
ABDOMEN - 2 VIEW

[abdomen erect (1 of 2)]
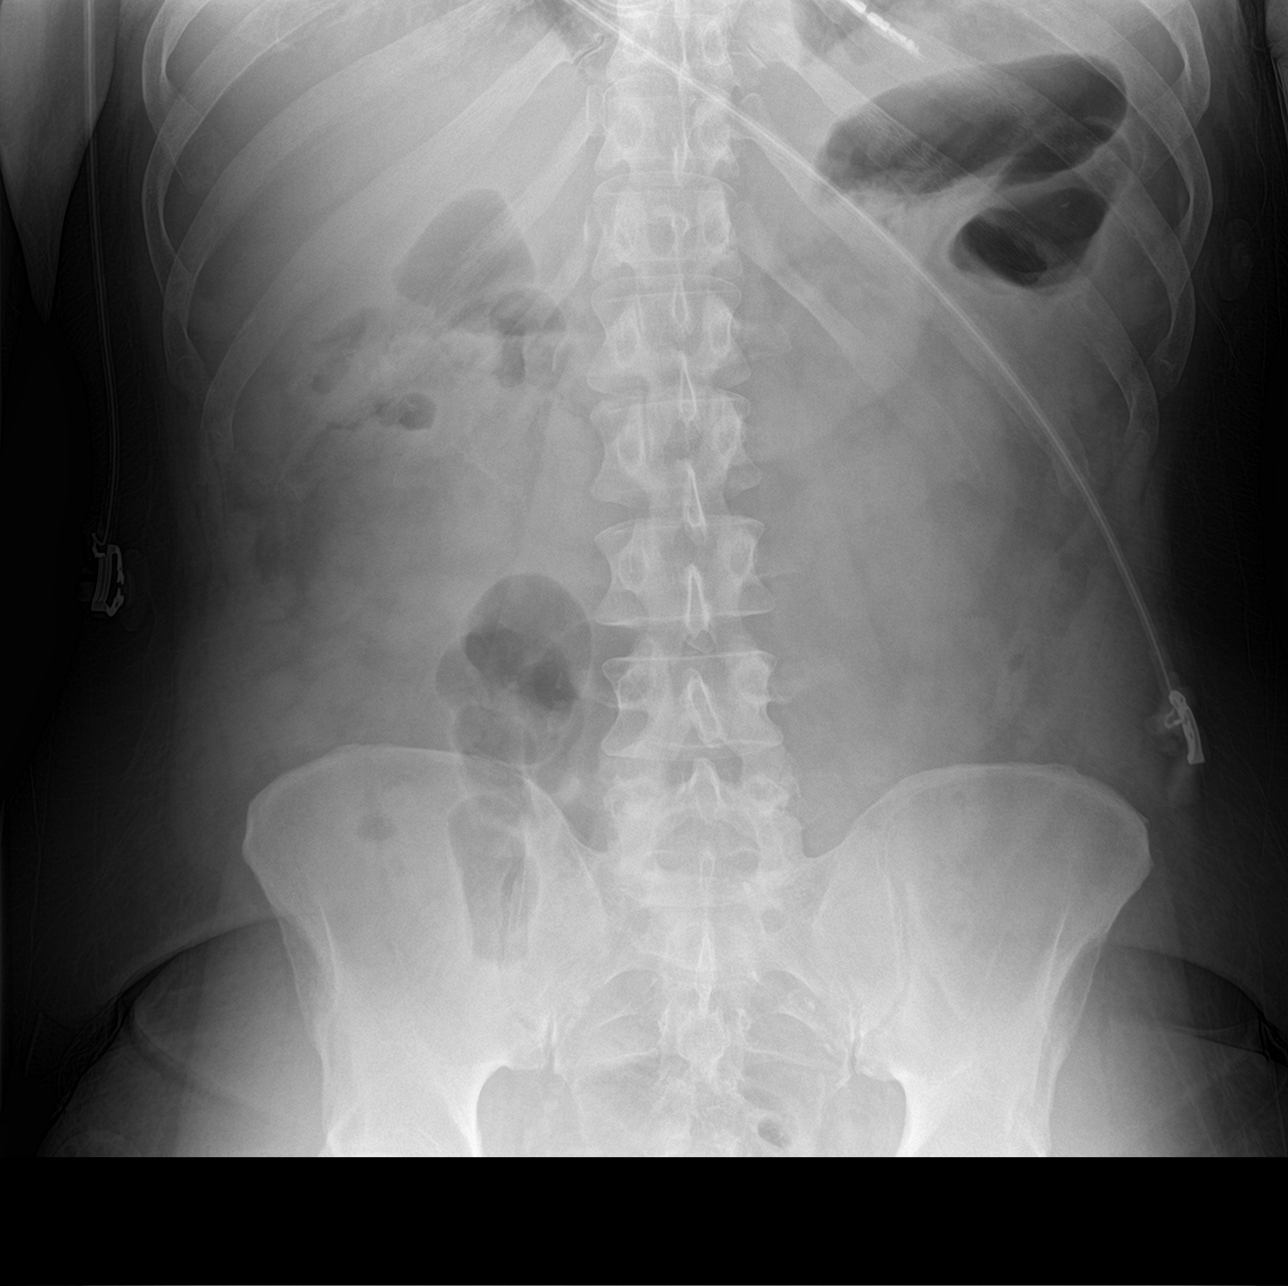

[abdomen supine]
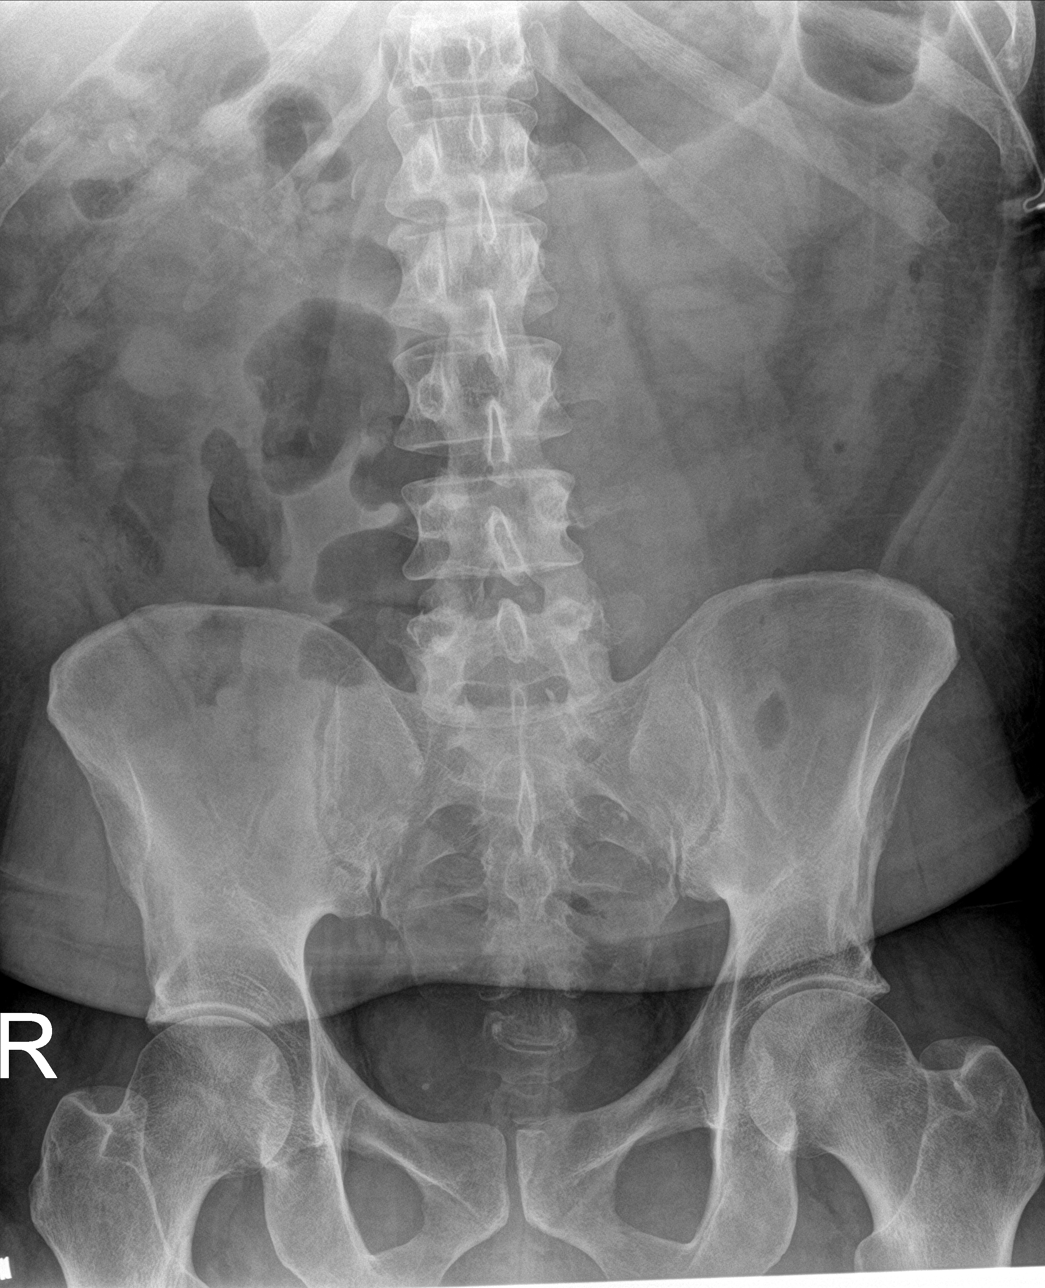

[abdomen erect (2 of 2)]
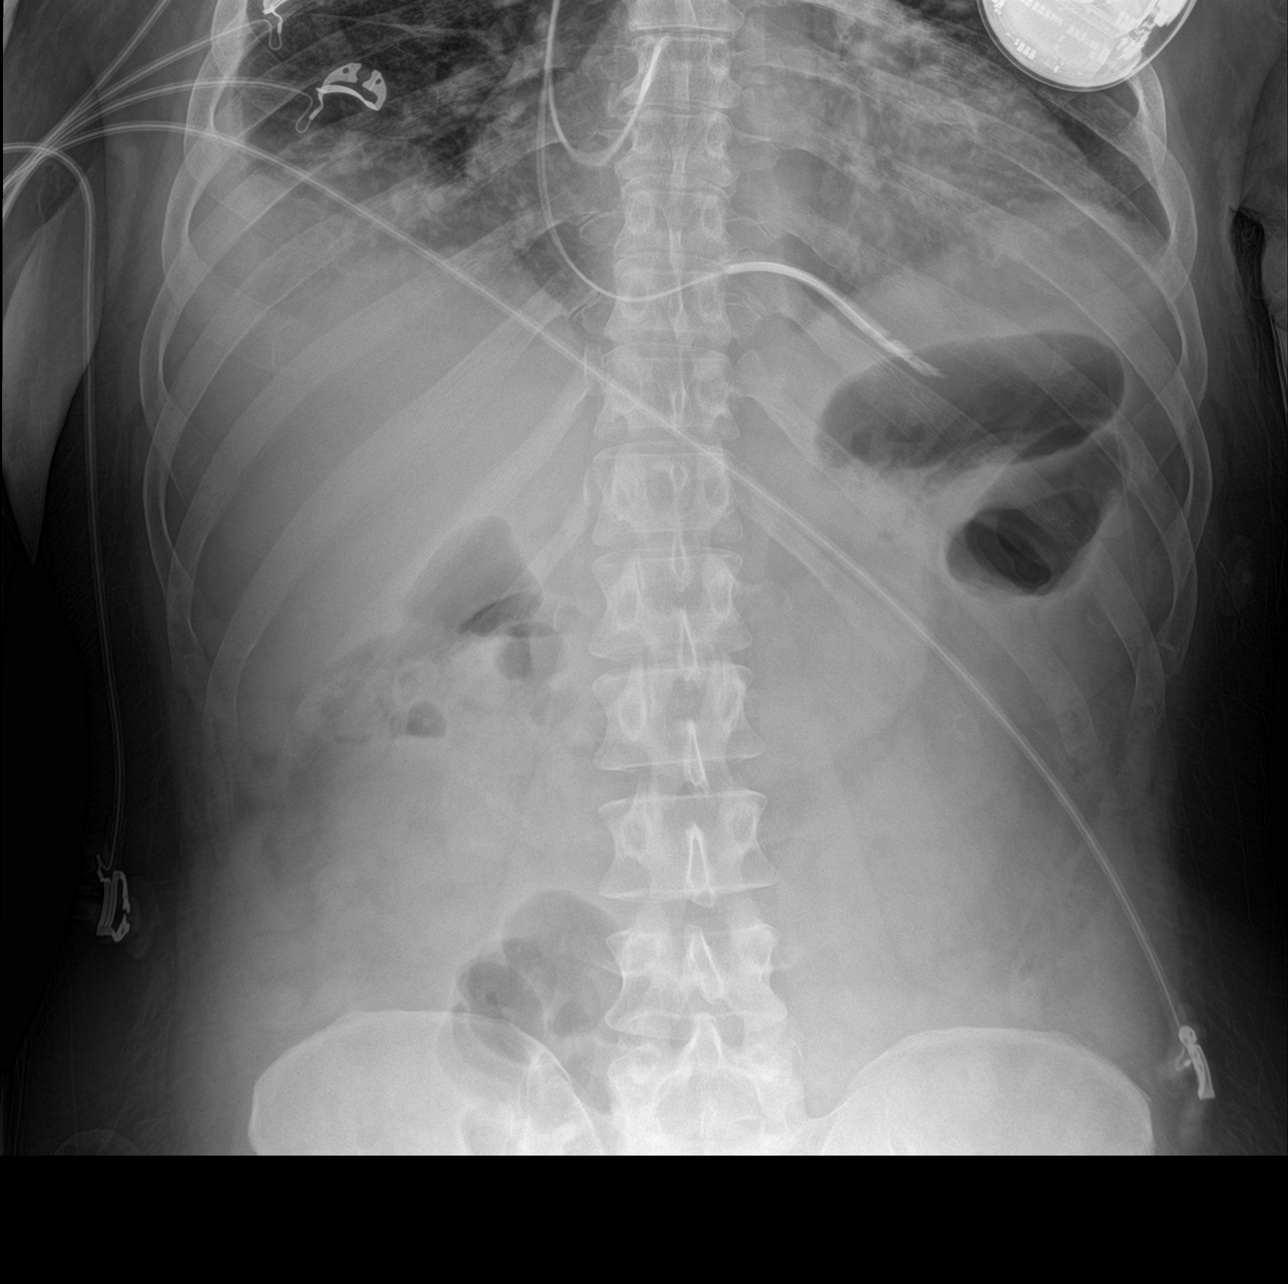

[3 of 3 positions shown; findings below may reference images not displayed]

FINDINGS: There is no evidence of intraperitoneal free air. Scattered colonic
gas is present (including in sigmoid colon which courses into the
right lower quadrant). No dilated loops of bowel are seen to suggest
obstruction. An ICD is partially visualized. Lung findings,
including a small right pleural effusion, are more fully evaluated
on today's earlier chest radiographs.
IMPRESSION: Nonobstructed bowel gas pattern.

## 2019-03-01 ENCOUNTER — Ambulatory Visit (HOSPITAL_COMMUNITY): Payer: Self-pay | Admitting: Pharmacist

## 2019-03-01 LAB — POCT INR: INR: 2.8 (ref 2–3)

## 2019-03-08 ENCOUNTER — Other Ambulatory Visit (HOSPITAL_COMMUNITY): Payer: Self-pay | Admitting: *Deleted

## 2019-03-08 ENCOUNTER — Ambulatory Visit (HOSPITAL_COMMUNITY): Payer: Self-pay | Admitting: Pharmacist

## 2019-03-08 ENCOUNTER — Telehealth (HOSPITAL_COMMUNITY): Payer: Self-pay | Admitting: *Deleted

## 2019-03-08 DIAGNOSIS — Z5181 Encounter for therapeutic drug level monitoring: Secondary | ICD-10-CM

## 2019-03-08 DIAGNOSIS — Z95811 Presence of heart assist device: Secondary | ICD-10-CM

## 2019-03-08 DIAGNOSIS — M79671 Pain in right foot: Secondary | ICD-10-CM

## 2019-03-08 LAB — POCT INR: INR: 3 (ref 2.0–3.0)

## 2019-03-08 MED ORDER — GABAPENTIN 100 MG PO CAPS: 100.0000 mg | ORAL_CAPSULE | Freq: Every day | ORAL | 3 refills | Status: DC

## 2019-03-08 NOTE — Telephone Encounter (Signed)
[name] called complaining of right 3rd and 4th toe burning toe pain that has been going on "for a while." States the pain wakes him up while sleeping. PA at dialysis center was able to doppler pulse. PA schedule [name] for podiatrist appointment July 28th and ordered xrays at [facility]. Dr. Aundra Dubin made aware. Order received for Gabapentin 120m QHS. [name] made aware and he verbalized understanding.   [name] MonteRN VKinnelonCoordinator  Office: [phone] 24/7 Pager: [phone]

## 2019-03-11 ENCOUNTER — Ambulatory Visit
Admission: RE | Admit: 2019-03-11 | Discharge: 2019-03-11 | Disposition: A | Discharge status: Home / Self Care | Payer: BC Managed Care – PPO | Source: Ambulatory Visit | Attending: Nephrology | Admitting: Nephrology

## 2019-03-11 ENCOUNTER — Other Ambulatory Visit: Payer: Self-pay | Admitting: Nephrology

## 2019-03-11 DIAGNOSIS — R52 Pain, unspecified: Secondary | ICD-10-CM

## 2019-03-12 ENCOUNTER — Other Ambulatory Visit (HOSPITAL_COMMUNITY): Payer: Self-pay | Admitting: *Deleted

## 2019-03-12 DIAGNOSIS — Z95811 Presence of heart assist device: Secondary | ICD-10-CM

## 2019-03-12 DIAGNOSIS — M79671 Pain in right foot: Secondary | ICD-10-CM

## 2019-03-12 MED ORDER — GABAPENTIN 100 MG PO CAPS: 100.0000 mg | ORAL_CAPSULE | Freq: Two times a day (BID) | ORAL | 3 refills | Status: DC

## 2019-03-12 NOTE — Telephone Encounter (Signed)
Patient called this morning and states that his pain in his foot has significantly improved with Gabapentin 161m QHS, but is still having some pain through out the day. Will increase dosing to Gabapentin 108mBID. Patient verbalized understanding.   AlEmerson MonteN VAMeyersdaleoordinator  Office: [phone]/7 Pager: [phone]

## 2019-03-14 ENCOUNTER — Telehealth (HOSPITAL_COMMUNITY): Payer: Self-pay | Admitting: *Deleted

## 2019-03-14 ENCOUNTER — Other Ambulatory Visit: Payer: Self-pay | Admitting: Family Medicine

## 2019-03-14 DIAGNOSIS — E118 Type 2 diabetes mellitus with unspecified complications: Secondary | ICD-10-CM

## 2019-03-14 DIAGNOSIS — Z794 Long term (current) use of insulin: Secondary | ICD-10-CM

## 2019-03-14 NOTE — Telephone Encounter (Signed)
Pt called VAD clinic to report his toe pain "has gotten better" with prescribed gabapentin, but it did not take the pain "completely away". He said a uric acid was drawn at dialysis center yesterday, result available first of next week.  He says the "skin came off" of toe and he does "not want to lose" his toe; concerned because of his diabetes. Advised him to notify MD that follows him for diabetes and to see asap. If pt unable to get appt, he will call VAD pager for first available in VAD clinic.  Zada Girt RN, [place] Coordiantor [phone]

## 2019-03-15 ENCOUNTER — Inpatient Hospital Stay: Payer: Self-pay

## 2019-03-15 ENCOUNTER — Other Ambulatory Visit: Payer: Self-pay

## 2019-03-15 ENCOUNTER — Inpatient Hospital Stay (HOSPITAL_COMMUNITY): Payer: BC Managed Care – PPO

## 2019-03-15 ENCOUNTER — Ambulatory Visit (INDEPENDENT_AMBULATORY_CARE_PROVIDER_SITE_OTHER): Payer: BC Managed Care – PPO | Admitting: Family Medicine

## 2019-03-15 ENCOUNTER — Ambulatory Visit (HOSPITAL_COMMUNITY): Payer: Self-pay | Admitting: Pharmacist

## 2019-03-15 ENCOUNTER — Inpatient Hospital Stay (HOSPITAL_COMMUNITY)
Admission: AD | Admit: 2019-03-15 | Discharge: 2019-03-22 | DRG: 252 | Disposition: A | Discharge status: Home / Self Care | Payer: BC Managed Care – PPO | Source: Ambulatory Visit | Attending: Cardiology | Admitting: Cardiology

## 2019-03-15 ENCOUNTER — Encounter: Payer: Self-pay | Admitting: Family Medicine

## 2019-03-15 VITALS — BP 124/80 | HR 60

## 2019-03-15 DIAGNOSIS — N185 Chronic kidney disease, stage 5: Secondary | ICD-10-CM | POA: Diagnosis not present

## 2019-03-15 DIAGNOSIS — Z7982 Long term (current) use of aspirin: Secondary | ICD-10-CM

## 2019-03-15 DIAGNOSIS — Z20828 Contact with and (suspected) exposure to other viral communicable diseases: Secondary | ICD-10-CM | POA: Diagnosis present

## 2019-03-15 DIAGNOSIS — I96 Gangrene, not elsewhere classified: Secondary | ICD-10-CM | POA: Diagnosis not present

## 2019-03-15 DIAGNOSIS — K5090 Crohn's disease, unspecified, without complications: Secondary | ICD-10-CM | POA: Diagnosis present

## 2019-03-15 DIAGNOSIS — Z86718 Personal history of other venous thrombosis and embolism: Secondary | ICD-10-CM

## 2019-03-15 DIAGNOSIS — I132 Hypertensive heart and chronic kidney disease with heart failure and with stage 5 chronic kidney disease, or end stage renal disease: Secondary | ICD-10-CM | POA: Diagnosis present

## 2019-03-15 DIAGNOSIS — D6851 Activated protein C resistance: Secondary | ICD-10-CM | POA: Diagnosis present

## 2019-03-15 DIAGNOSIS — Z992 Dependence on renal dialysis: Secondary | ICD-10-CM

## 2019-03-15 DIAGNOSIS — Z955 Presence of coronary angioplasty implant and graft: Secondary | ICD-10-CM | POA: Diagnosis not present

## 2019-03-15 DIAGNOSIS — K219 Gastro-esophageal reflux disease without esophagitis: Secondary | ICD-10-CM | POA: Diagnosis present

## 2019-03-15 DIAGNOSIS — Z888 Allergy status to other drugs, medicaments and biological substances status: Secondary | ICD-10-CM

## 2019-03-15 DIAGNOSIS — I255 Ischemic cardiomyopathy: Secondary | ICD-10-CM | POA: Diagnosis present

## 2019-03-15 DIAGNOSIS — I252 Old myocardial infarction: Secondary | ICD-10-CM | POA: Diagnosis not present

## 2019-03-15 DIAGNOSIS — E1143 Type 2 diabetes mellitus with diabetic autonomic (poly)neuropathy: Secondary | ICD-10-CM | POA: Diagnosis present

## 2019-03-15 DIAGNOSIS — I5022 Chronic systolic (congestive) heart failure: Secondary | ICD-10-CM | POA: Diagnosis not present

## 2019-03-15 DIAGNOSIS — Z823 Family history of stroke: Secondary | ICD-10-CM

## 2019-03-15 DIAGNOSIS — K3184 Gastroparesis: Secondary | ICD-10-CM | POA: Diagnosis present

## 2019-03-15 DIAGNOSIS — Z79891 Long term (current) use of opiate analgesic: Secondary | ICD-10-CM

## 2019-03-15 DIAGNOSIS — Z7901 Long term (current) use of anticoagulants: Secondary | ICD-10-CM

## 2019-03-15 DIAGNOSIS — I998 Other disorder of circulatory system: Secondary | ICD-10-CM | POA: Diagnosis present

## 2019-03-15 DIAGNOSIS — R918 Other nonspecific abnormal finding of lung field: Secondary | ICD-10-CM | POA: Diagnosis present

## 2019-03-15 DIAGNOSIS — Z95810 Presence of automatic (implantable) cardiac defibrillator: Secondary | ICD-10-CM | POA: Diagnosis present

## 2019-03-15 DIAGNOSIS — Z95811 Presence of heart assist device: Secondary | ICD-10-CM

## 2019-03-15 DIAGNOSIS — Z79899 Other long term (current) drug therapy: Secondary | ICD-10-CM

## 2019-03-15 DIAGNOSIS — T82538A Leakage of other cardiac and vascular devices and implants, initial encounter: Secondary | ICD-10-CM

## 2019-03-15 DIAGNOSIS — I70235 Atherosclerosis of native arteries of right leg with ulceration of other part of foot: Secondary | ICD-10-CM | POA: Diagnosis not present

## 2019-03-15 DIAGNOSIS — Z5181 Encounter for therapeutic drug level monitoring: Secondary | ICD-10-CM

## 2019-03-15 DIAGNOSIS — Z794 Long term (current) use of insulin: Secondary | ICD-10-CM

## 2019-03-15 DIAGNOSIS — I2510 Atherosclerotic heart disease of native coronary artery without angina pectoris: Secondary | ICD-10-CM | POA: Diagnosis present

## 2019-03-15 DIAGNOSIS — E1122 Type 2 diabetes mellitus with diabetic chronic kidney disease: Secondary | ICD-10-CM | POA: Diagnosis present

## 2019-03-15 DIAGNOSIS — L97519 Non-pressure chronic ulcer of other part of right foot with unspecified severity: Secondary | ICD-10-CM | POA: Diagnosis present

## 2019-03-15 DIAGNOSIS — M79671 Pain in right foot: Secondary | ICD-10-CM | POA: Diagnosis not present

## 2019-03-15 DIAGNOSIS — N2581 Secondary hyperparathyroidism of renal origin: Secondary | ICD-10-CM | POA: Diagnosis present

## 2019-03-15 DIAGNOSIS — Z9049 Acquired absence of other specified parts of digestive tract: Secondary | ICD-10-CM

## 2019-03-15 DIAGNOSIS — I509 Heart failure, unspecified: Secondary | ICD-10-CM

## 2019-03-15 DIAGNOSIS — Z8249 Family history of ischemic heart disease and other diseases of the circulatory system: Secondary | ICD-10-CM

## 2019-03-15 DIAGNOSIS — E11621 Type 2 diabetes mellitus with foot ulcer: Secondary | ICD-10-CM | POA: Diagnosis present

## 2019-03-15 DIAGNOSIS — D631 Anemia in chronic kidney disease: Secondary | ICD-10-CM | POA: Diagnosis present

## 2019-03-15 DIAGNOSIS — N186 End stage renal disease: Secondary | ICD-10-CM | POA: Diagnosis present

## 2019-03-15 DIAGNOSIS — E785 Hyperlipidemia, unspecified: Secondary | ICD-10-CM | POA: Diagnosis present

## 2019-03-15 DIAGNOSIS — Z833 Family history of diabetes mellitus: Secondary | ICD-10-CM

## 2019-03-15 DIAGNOSIS — E119 Type 2 diabetes mellitus without complications: Secondary | ICD-10-CM

## 2019-03-15 DIAGNOSIS — E1152 Type 2 diabetes mellitus with diabetic peripheral angiopathy with gangrene: Secondary | ICD-10-CM | POA: Diagnosis present

## 2019-03-15 DIAGNOSIS — R7309 Other abnormal glucose: Secondary | ICD-10-CM

## 2019-03-15 DIAGNOSIS — I999 Unspecified disorder of circulatory system: Secondary | ICD-10-CM

## 2019-03-15 LAB — GLUCOSE, CAPILLARY: Glucose-Capillary: 182 mg/dL — ABNORMAL HIGH (ref 70–99)

## 2019-03-15 LAB — COMPREHENSIVE METABOLIC PANEL
ALT: 18 U/L (ref 0–44)
AST: 21 U/L (ref 15–41)
Albumin: 3.5 g/dL (ref 3.5–5.0)
Alkaline Phosphatase: 101 U/L (ref 38–126)
Anion gap: 14 (ref 5–15)
BUN: 19 mg/dL (ref 6–20)
CO2: 24 mmol/L (ref 22–32)
Calcium: 10 mg/dL (ref 8.9–10.3)
Chloride: 97 mmol/L — ABNORMAL LOW (ref 98–111)
Creatinine, Ser: 5.34 mg/dL — ABNORMAL HIGH (ref 0.61–1.24)
GFR calc Af Amer: 13 mL/min — ABNORMAL LOW (ref >60)
GFR calc non Af Amer: 11 mL/min — ABNORMAL LOW (ref >60)
Glucose, Bld: 130 mg/dL — ABNORMAL HIGH (ref 70–99)
Potassium: 3.7 mmol/L (ref 3.5–5.1)
Sodium: 135 mmol/L (ref 135–145)
Total Bilirubin: 0.7 mg/dL (ref 0.3–1.2)
Total Protein: 7.6 g/dL (ref 6.5–8.1)

## 2019-03-15 LAB — CBC WITH DIFFERENTIAL/PLATELET
Abs Immature Granulocytes: 0.15 10*3/uL — ABNORMAL HIGH (ref 0.00–0.07)
Basophils Absolute: 0 10*3/uL (ref 0.0–0.1)
Basophils Relative: 0 %
Eosinophils Absolute: 0.6 10*3/uL — ABNORMAL HIGH (ref 0.0–0.5)
Eosinophils Relative: 5 %
HCT: 35.3 % — ABNORMAL LOW (ref 39.0–52.0)
Hemoglobin: 11.6 g/dL — ABNORMAL LOW (ref 13.0–17.0)
Immature Granulocytes: 1 %
Lymphocytes Relative: 14 %
Lymphs Abs: 1.7 10*3/uL (ref 0.7–4.0)
MCH: 30.1 pg (ref 26.0–34.0)
MCHC: 32.9 g/dL (ref 30.0–36.0)
MCV: 91.7 fL (ref 80.0–100.0)
Monocytes Absolute: 0.7 10*3/uL (ref 0.1–1.0)
Monocytes Relative: 6 %
Neutro Abs: 9.4 10*3/uL — ABNORMAL HIGH (ref 1.7–7.7)
Neutrophils Relative %: 74 %
Platelets: 197 10*3/uL (ref 150–400)
RBC: 3.85 MIL/uL — ABNORMAL LOW (ref 4.22–5.81)
RDW: 14.7 % (ref 11.5–15.5)
WBC: 12.7 10*3/uL — ABNORMAL HIGH (ref 4.0–10.5)
nRBC: 0 % (ref 0.0–0.2)

## 2019-03-15 LAB — POCT GLYCOSYLATED HEMOGLOBIN (HGB A1C): HbA1c, POC (controlled diabetic range): 6.6 % (ref 0.0–7.0)

## 2019-03-15 LAB — POCT INR
INR: 1.9 — AB (ref 2.0–3.0)
INR: 2.9 (ref 2.0–3.0)

## 2019-03-15 LAB — PROTIME-INR
INR: 1.8 — ABNORMAL HIGH (ref 0.8–1.2)
Prothrombin Time: 20.6 seconds — ABNORMAL HIGH (ref 11.4–15.2)

## 2019-03-15 LAB — LACTATE DEHYDROGENASE: LDH: 266 U/L — ABNORMAL HIGH (ref 98–192)

## 2019-03-15 LAB — MRSA PCR SCREENING: MRSA by PCR: NEGATIVE

## 2019-03-15 LAB — SARS CORONAVIRUS 2 BY RT PCR (HOSPITAL ORDER, PERFORMED IN ~~LOC~~ HOSPITAL LAB): SARS Coronavirus 2: NEGATIVE

## 2019-03-15 MED ORDER — FENTANYL CITRATE (PF) 100 MCG/2ML IJ SOLN
25.0000 ug | Freq: Four times a day (QID) | INTRAMUSCULAR | Status: DC | PRN
  Administered 2019-03-15 – 2019-03-22 (×20): 50 ug via INTRAVENOUS
  Filled 2019-03-15 (×19): qty 2

## 2019-03-15 MED ORDER — METOCLOPRAMIDE HCL 10 MG PO TABS
10.0000 mg | ORAL_TABLET | Freq: Three times a day (TID) | ORAL | Status: DC
  Administered 2019-03-15 – 2019-03-22 (×21): 10 mg via ORAL
  Filled 2019-03-15 (×20): qty 1

## 2019-03-15 MED ORDER — ONDANSETRON HCL 4 MG/2ML IJ SOLN
4.0000 mg | Freq: Four times a day (QID) | INTRAMUSCULAR | Status: DC | PRN
  Administered 2019-03-16: 03:00:00 4 mg via INTRAVENOUS
  Filled 2019-03-15: qty 2

## 2019-03-15 MED ORDER — MIDODRINE HCL 5 MG PO TABS: 5.0000 mg | ORAL_TABLET | Freq: Every day | ORAL | Status: DC

## 2019-03-15 MED ORDER — RENA-VITE PO TABS
1.0000 | ORAL_TABLET | Freq: Every day | ORAL | Status: DC
  Administered 2019-03-15 – 2019-03-22 (×8): 1 via ORAL
  Filled 2019-03-15 (×8): qty 1

## 2019-03-15 MED ORDER — INSULIN ASPART 100 UNIT/ML ~~LOC~~ SOLN
0.0000 [IU] | Freq: Three times a day (TID) | SUBCUTANEOUS | Status: DC
  Administered 2019-03-16 (×2): 3 [IU] via SUBCUTANEOUS
  Administered 2019-03-17: 5 [IU] via SUBCUTANEOUS
  Administered 2019-03-18: 2 [IU] via SUBCUTANEOUS
  Administered 2019-03-19 – 2019-03-21 (×2): 3 [IU] via SUBCUTANEOUS
  Administered 2019-03-22 (×2): 2 [IU] via SUBCUTANEOUS

## 2019-03-15 MED ORDER — AMIODARONE HCL 200 MG PO TABS: 200.0000 mg | ORAL_TABLET | Freq: Every day | ORAL | Status: DC

## 2019-03-15 MED ORDER — ASPIRIN EC 81 MG PO TBEC
81.0000 mg | DELAYED_RELEASE_TABLET | Freq: Every day | ORAL | Status: DC
  Administered 2019-03-15 – 2019-03-22 (×8): 81 mg via ORAL
  Filled 2019-03-15 (×8): qty 1

## 2019-03-15 MED ORDER — TRAMADOL HCL 50 MG PO TABS
50.0000 mg | ORAL_TABLET | Freq: Four times a day (QID) | ORAL | Status: DC | PRN
  Administered 2019-03-15 – 2019-03-22 (×5): 50 mg via ORAL
  Filled 2019-03-15 (×6): qty 1

## 2019-03-15 MED ORDER — ERYTHROMYCIN BASE 250 MG PO TABS
250.0000 mg | ORAL_TABLET | Freq: Three times a day (TID) | ORAL | Status: DC
  Administered 2019-03-15 – 2019-03-22 (×21): 250 mg via ORAL
  Filled 2019-03-15 (×22): qty 1

## 2019-03-15 MED ORDER — INSULIN ASPART 100 UNIT/ML ~~LOC~~ SOLN
0.0000 [IU] | Freq: Every day | SUBCUTANEOUS | Status: DC
  Administered 2019-03-19: 2 [IU] via SUBCUTANEOUS

## 2019-03-15 MED ORDER — SEVELAMER CARBONATE 800 MG PO TABS
4000.0000 mg | ORAL_TABLET | Freq: Three times a day (TID) | ORAL | Status: DC
  Filled 2019-03-15 (×6): qty 5

## 2019-03-15 MED ORDER — DOCUSATE SODIUM 100 MG PO CAPS: 100.0000 mg | ORAL_CAPSULE | Freq: Every day | ORAL | Status: DC | PRN

## 2019-03-15 MED ORDER — SEVELAMER CARBONATE 800 MG PO TABS
1600.0000 mg | ORAL_TABLET | Freq: Two times a day (BID) | ORAL | Status: DC
  Filled 2019-03-15 (×2): qty 2

## 2019-03-15 MED ORDER — AMIODARONE HCL 200 MG PO TABS
200.0000 mg | ORAL_TABLET | Freq: Every day | ORAL | Status: DC
  Administered 2019-03-15 – 2019-03-22 (×8): 200 mg via ORAL
  Filled 2019-03-15 (×8): qty 1

## 2019-03-15 MED ORDER — GABAPENTIN 100 MG PO CAPS
100.0000 mg | ORAL_CAPSULE | Freq: Two times a day (BID) | ORAL | Status: DC
  Administered 2019-03-15 – 2019-03-22 (×13): 100 mg via ORAL
  Filled 2019-03-15 (×13): qty 1

## 2019-03-15 MED ORDER — INSULIN GLARGINE 100 UNIT/ML ~~LOC~~ SOLN
5.0000 [IU] | Freq: Every day | SUBCUTANEOUS | Status: DC
  Administered 2019-03-16 – 2019-03-22 (×6): 5 [IU] via SUBCUTANEOUS
  Filled 2019-03-15 (×7): qty 0.05

## 2019-03-15 MED ORDER — ASPIRIN EC 81 MG PO TBEC: 81.0000 mg | DELAYED_RELEASE_TABLET | Freq: Every day | ORAL | Status: DC

## 2019-03-15 MED ORDER — CALCITRIOL 0.25 MCG PO CAPS: 0.5000 ug | ORAL_CAPSULE | ORAL | Status: DC

## 2019-03-15 MED ORDER — DRONABINOL 2.5 MG PO CAPS
2.5000 mg | ORAL_CAPSULE | Freq: Two times a day (BID) | ORAL | Status: DC
  Administered 2019-03-16 – 2019-03-22 (×14): 2.5 mg via ORAL
  Filled 2019-03-15 (×13): qty 1

## 2019-03-15 MED ORDER — MIDODRINE HCL 5 MG PO TABS
5.0000 mg | ORAL_TABLET | ORAL | Status: DC
  Administered 2019-03-18 – 2019-03-22 (×5): 5 mg via ORAL
  Filled 2019-03-15 (×3): qty 1

## 2019-03-15 MED ORDER — INSULIN ASPART 100 UNIT/ML ~~LOC~~ SOLN
7.0000 [IU] | Freq: Every evening | SUBCUTANEOUS | Status: DC
  Administered 2019-03-16 – 2019-03-21 (×6): 7 [IU] via SUBCUTANEOUS

## 2019-03-15 MED ORDER — CITALOPRAM HYDROBROMIDE 20 MG PO TABS: 20.0000 mg | ORAL_TABLET | Freq: Every day | ORAL | Status: DC

## 2019-03-15 MED ORDER — ATORVASTATIN CALCIUM 40 MG PO TABS
40.0000 mg | ORAL_TABLET | Freq: Every day | ORAL | Status: DC
  Administered 2019-03-15 – 2019-03-22 (×9): 40 mg via ORAL
  Filled 2019-03-15 (×8): qty 1

## 2019-03-15 MED ORDER — PANTOPRAZOLE SODIUM 40 MG PO TBEC
40.0000 mg | DELAYED_RELEASE_TABLET | Freq: Two times a day (BID) | ORAL | Status: DC
  Administered 2019-03-15 – 2019-03-22 (×13): 40 mg via ORAL
  Filled 2019-03-15 (×13): qty 1

## 2019-03-15 MED ORDER — HEPARIN (PORCINE) 25000 UT/250ML-% IV SOLN
1150.0000 [IU]/h | INTRAVENOUS | Status: AC
  Administered 2019-03-15 – 2019-03-17 (×3): 1150 [IU]/h via INTRAVENOUS
  Filled 2019-03-15 (×3): qty 250

## 2019-03-15 MED ORDER — PROMETHAZINE HCL 25 MG PO TABS
12.5000 mg | ORAL_TABLET | Freq: Four times a day (QID) | ORAL | Status: DC | PRN
  Administered 2019-03-16: 12.5 mg via ORAL
  Filled 2019-03-15: qty 1

## 2019-03-15 MED ORDER — CITALOPRAM HYDROBROMIDE 20 MG PO TABS
20.0000 mg | ORAL_TABLET | Freq: Every day | ORAL | Status: DC
  Administered 2019-03-15 – 2019-03-22 (×8): 20 mg via ORAL
  Filled 2019-03-15 (×8): qty 1

## 2019-03-15 MED ORDER — ACETAMINOPHEN 325 MG PO TABS: 650.0000 mg | ORAL_TABLET | ORAL | Status: DC | PRN

## 2019-03-15 MED ORDER — LORAZEPAM 0.5 MG PO TABS: 0.5000 mg | ORAL_TABLET | Freq: Two times a day (BID) | ORAL | Status: DC | PRN

## 2019-03-15 MED ORDER — LIDOCAINE-PRILOCAINE 2.5-2.5 % EX CREA
1.0000 "application " | TOPICAL_CREAM | CUTANEOUS | Status: DC | PRN
  Filled 2019-03-15: qty 5

## 2019-03-15 NOTE — Progress Notes (Signed)
Stat bilateral lower extremity arterial duplex completed. Refer to "CV Proc" under chart review to view preliminary results.  Preliminary results discussed with Apolonio Schneiders, RN.  03/15/2019 6:54 PM Maudry Mayhew, MHA, RVT, RDCS, RDMS

## 2019-03-15 NOTE — Procedures (Signed)
Central Venous Catheter Insertion Procedure Note [name] [id number] 1966-06-19  Procedure: Insertion of Central Venous Catheter Indications: Drug and/or fluid administration, Frequent blood sampling and poor vascular access  Procedure Details Consent: Risks of procedure as well as the alternatives and risks of each were explained to the (patient/caregiver).  Consent for procedure obtained. Time Out: Verified patient identification, verified procedure, site/side was marked, verified correct patient position, special equipment/implants available, medications/allergies/relevent history reviewed, required imaging and test results available.  Performed  Maximum sterile technique was used including antiseptics, cap, gloves, gown, hand hygiene, mask and sheet. Skin prep: Chlorhexidine; local anesthetic administered A antimicrobial bonded/coated triple lumen catheter was placed in the right internal jugular vein using the Seldinger technique.  Evaluation Blood flow good Complications: No apparent complications Patient did tolerate procedure well. Chest X-ray ordered to verify placement.  CXR: pending.  Cristal Generous 03/15/2019, 8:32 PM  Eliseo Gum MSN, AGACNP-BC [place] [phone] If no answer, [phone] 03/15/2019, 8:34 PM

## 2019-03-15 NOTE — Progress Notes (Signed)
ANTICOAGULATION CONSULT NOTE - Initial Consult  Pharmacy Consult for heparin Indication: LVAD  Allergies  Allergen Reactions  . Metformin And Related Diarrhea    Patient Measurements:   Heparin Dosing Weight: 82.2 kg  Vital Signs: Temp: 99 F (37.2 C) (07/10 1700) Temp Source: Oral (07/10 1700) BP: 104/91 (07/10 1830) Pulse Rate: 83 (07/10 1830)  Labs: Recent Labs    03/15/19 03/15/19 1807  HGB  --  11.6*  HCT  --  35.3*  PLT  --  197  LABPROT  --  20.6*  INR 2.9  1.9* 1.8*  CREATININE  --  5.34*    CrCl cannot be calculated (Unknown ideal weight.).   Medical History: Past Medical History:  Diagnosis Date  . Angina   . ASCVD (arteriosclerotic cardiovascular disease)    , Anterior infarction 2005, LAD diagonal bifurcation intervention 03/2004  . Automatic implantable cardiac defibrillator -St. Jude's       . Benign neoplasm of colon   . CHF (congestive heart failure) ([place])   . Chronic systolic heart failure ([place])   . Coronary artery disease     Widely patent previously placed stents in the left anterior   . Crohn's disease ([place])   . 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 Dialysis patient ([place])   . Dyspnea   . Gastroparesis   . GERD (gastroesophageal reflux disease)   . High cholesterol   . Hyperlipidemia   . Hypersomnolent    Previous diagnosis of narcolepsy  . Hypertension, essential   . Ischemic cardiomyopathy    Ejection fraction 15-20% catheterization 2010  . Type II or unspecified type diabetes mellitus without mention of complication, not stated as uncontrolled   . Unspecified gastritis and gastroduodenitis without mention of hemorrhage     Medications:  Scheduled:  . amiodarone  200 mg Oral Daily  . aspirin EC  81 mg Oral Daily  . atorvastatin  40 mg Oral q1800  . [START ON 03/18/2019] calcitRIOL  0.5 mcg Oral Q M,W,F  . citalopram  20 mg Oral Daily  . [START ON 03/16/2019] dronabinol  2.5 mg Oral BID AC  . erythromycin   250 mg Oral TID  . gabapentin  100 mg Oral BID  . insulin aspart  0-15 Units Subcutaneous TID WC  . insulin aspart  0-5 Units Subcutaneous QHS  . insulin aspart  7 Units Subcutaneous QPM  . [START ON 03/16/2019] insulin glargine  5 Units Subcutaneous Daily  . metoCLOPramide  10 mg Oral TID  . [START ON 03/18/2019] midodrine  5 mg Oral Q M,W,F  . multivitamin  1 tablet Oral Daily  . pantoprazole  40 mg Oral BID  . [START ON 03/16/2019] sevelamer carbonate  1,600 mg Oral BID WC  . [START ON 03/16/2019] sevelamer carbonate  4,000 mg Oral TID WC   Infusions:    Assessment: 56 yoM admitted for right foot pain, PMH significant for systolic CHF s/p HeartMate 3 LVAD placed in 06/2017, ESRD with HD MWF (last HD session 7/10), CAD, h/o DVT, DM2, gastroparesis, and Aflutter on amiodarone. At home, patient takes aspirin 81 mg daily and warfarin 7.5 mg on Tue/Fri and 5 mg on all other days, last dose taken on 7/9 PTA. INR goal is 2-3, INR upon arrival is subtherapeutic at 1.8. Warfarin is being held for arteriogram. Pharmacy consulted to start heparin gtt when INR <2.  CBC values low but at baseline. Hgb 11.6, Hct 35.3, Plt 197.   Goal  of Therapy:  Heparin level 0.3-0.7 units/ml Monitor platelets by anticoagulation protocol: Yes   Plan:  Start heparin infusion at 1150 units/hr Check anti-Xa level in 8 hours and daily while on heparin Continue to monitor H&H and platelets  Berenice Bouton, PharmD PGY1 Pharmacy Resident Office phone: [phone] 03/15/2019,7:33 PM

## 2019-03-15 NOTE — H&P (Signed)
VAD TEAM History & Physical Note   Reason for Admission: Ischemic foot.    HPI:    53 y.o. with history of CAD s/p anterior MI in 2010 and NSTEMI in 3/18 with DES to pRCA and mLAD and ischemic cardiomyopathy now s/p Heartmate 3 LVAD and ESRD.  Patient developed low output heart failure.  He was not deemed to be a good transplant candidate.  He was admitted in 10/18 and Heartmate 3 LVAD was placed.  He had baseline CKD stage 3 likely from diabetic nephropathy and CHF.  He developed peri-op AKI and ended up on dialysis.  No renal recovery to date.  St Jude ICD has been turned back on and ramp echo was done prior to discharge in 10/18, speed increased to 5600 rpm.   He was admitted in 11/18 with nausea/vomiting and abdominal pain.  This appears to have been due to diabetic gastroparesis (extensive GI evaluation). He had atrial flutter this admission that was converted to NSR with amiodarone.  He improved considerably with scheduled Reglan and erythromycin. He was re-admitted later in 11/18 with the same symptoms, suspect diabetic gastroparesis and was again treated with erythromycin.  He was not able to start domperidone due to prolonged QT interval. He had nausea again about a week ago but was able to manage at home with Phenergan suppositories.   In 2019 he had 4 admits for uncontrolled N/V in the setting of gastroparesis flare. Treated with erythromycin. He has been evaluated at [facility] by Dr. Derrill Kay (gastroparesis specialist).   Gastric emptying study, surprisingly, was near normal. However, electrogastrogram showed poor gastric accomodation/capacity, "bradygastria."  Frequent small meals were recommended. Also, early am nausea was thought to be possible GERD.     He was admitted in 2/19 for AV graft placement.  While in the hospital, we turned down his speed.  Since then, he has had considerably fewer PI events on HD days.    Admitted 6/26 - 03/19/18 for emergent VAD exchange  03/01/18 in the setting of HM3 outflow graft kink.He required CVVHD post op, but was transtioned back to [facility] without problems. Course complicated by leukocytosis and fever. CT abd/chest negative for infection and blood cultures were negative. Post op pain well controlled withraretramadol. Able to tolerate diet. Evaluated by PT/OT and HH PT recommended for discharge. Ramp echo completed prior to discharge with speed optimization. Resumed81 mg ASA + coumadin with INR goal 2-3.  He was admitted in 9/19 for LUE AV fistula placement.  He was readmitted in 10/19 with his typical gastroparesis symptoms, unable to take po. The fistula later failed and was replaced with a graft.  However, the graft was nonfunctional and developed a hematoma at the surgical site.    He was admitted in 12/19 with nausea/vomiting from HD. Also with LDH increased to 427 in setting of extensive hematoma around his left arm AV graft.  He has had tingling in his left hand and left grip is weak.    Today, he was seeing his PCP and reported right foot pain.  The skin had peeled off his right 4th toe.  He was thought to have an ischemic foot so was admitted.   He tells me that his whole right foot has been aching for 1 month, steadily worsening.  It hurts worse when he walks.  He has not had abdominal pain or nausea recently.  Tolerating HD reasonably well, had HD today. He had a  low flow during HD.  Case has been discussed with VVS, concern for ischemic foot, plan for arteriogram on Monday. He denies fever, T 99 currently.   LVAD INTERROGATION:  HeartMate II LVAD:  Flow 3.5 liters/min, speed 5600, power 4.3, PI 7.1.  Multiple PI events and 1 low flow with HD today.    Review of Systems: All systems reviewed and negative except as per HPI.   Home Medications Prior to Admission medications   Medication Sig Start Date End Date Taking? Authorizing Provider  amiodarone (PACERONE) 200 MG tablet Take 200 mg by mouth daily.     [provider]  aspirin EC 81 MG tablet Take 1 tablet (81 mg total) by mouth daily. 08/20/18   Eileen Stanford, PA-C  atorvastatin (LIPITOR) 40 MG tablet Take 1 tablet (40 mg total) by mouth daily at 6 PM. 01/25/18   [name], MD  calcitRIOL (ROCALTROL) 0.5 MCG capsule Take 1 capsule (0.5 mcg total) by mouth every Monday, Wednesday, and Friday. 06/06/18   Georgiana Shore, NP  citalopram (CELEXA) 20 MG tablet Take 1 tablet (20 mg total) by mouth daily. 03/19/18   Georgiana Shore, NP  docusate sodium (COLACE) 100 MG capsule Take 100 mg by mouth daily as needed for mild constipation.    [provider]  dronabinol (MARINOL) 2.5 MG capsule Take 1 capsule (2.5 mg total) by mouth 2 (two) times daily before lunch and supper. 03/27/18   Larey Dresser, MD  erythromycin (E-MYCIN) 250 MG tablet Take 1 tablet (250 mg total) by mouth 3 (three) times daily. 03/25/18   Isaiah Serge, NP  gabapentin (NEURONTIN) 100 MG capsule Take 1 capsule (100 mg total) by mouth 2 (two) times daily. 03/12/19   Larey Dresser, MD  insulin glargine (LANTUS) 100 UNIT/ML injection Inject 0.1 mLs (10 Units total) into the skin daily. Patient taking differently: Inject 5 Units into the skin daily.  05/09/18   Lovenia Kim, MD  lidocaine-prilocaine (EMLA) cream Apply 1 application topically as needed (to numb port).    [provider]  LORazepam (ATIVAN) 0.5 MG tablet Take 1 tablet (0.5 mg total) by mouth every 12 (twelve) hours as needed (nausea). 03/27/18   Larey Dresser, MD  metoCLOPramide (REGLAN) 10 MG tablet Take 1 tablet (10 mg total) by mouth 3 (three) times daily. 01/22/18   Georgiana Shore, NP  midodrine (PROAMATINE) 5 MG tablet Take 1 tablet (5 mg total) by mouth daily. Take 1 tablet before dialysis treatment 02/07/19   Larey Dresser, MD  multivitamin (RENA-VIT) TABS tablet Take 1 tablet by mouth daily.    [provider]  NOVOLOG 100 UNIT/ML injection INJECT 7 UNITS  INTO THE SKIN DAILY WITH SUPPER. 03/15/19   Anderson, Chelsey L, DO  pantoprazole (PROTONIX) 40 MG tablet Take 40 mg by mouth 2 (two) times daily.     [provider]  promethazine (PHENERGAN) 12.5 MG tablet Take 1 tablet (12.5 mg total) by mouth every 6 (six) hours as needed for nausea or vomiting. 10/23/18   Larey Dresser, MD  sevelamer carbonate (RENVELA) 800 MG tablet Take 1,600-4,000 mg by mouth See admin instructions. 4054m three times daily with meals and 16057mtwice daily with snacks    [provider]  traMADol (ULTRAM) 50 MG tablet Take 1 tablet (50 mg total) by mouth every 6 (six) hours as needed for moderate pain. 08/18/18   ThEileen StanfordPA-C  warfarin (COUMADIN) 5 MG  tablet TAKE AS FOLLOWS: MON-WED-FRI 1&1/2 TABLETS DAILY. ON ALL OTHER DAYS 1 TABLET Patient taking differently: TAKE AS FOLLOWS: Tue and FRI 1&1/2 TABLETS DAILY. ON ALL OTHER DAYS 1 TABLET 09/12/18   Larey Dresser, MD    Past Medical History: 1. Chronic systolic CHF: Ischemic cardiomyopathy. St Jude ICD.  - Echo (11/17) with EF 25-30%, moderate diastolic dysfunction, mild MR, mildly dilated RV with moderately decreased systolic function, peak RV-RA gradient 48 mmHg.  - Echo (3/18) with EF 25-30%, wall motion abnormalities, normal RV size and systolic function.  - Admission 3/18 with cardiogenic shock and AKI, required milrinone gtt.  - Echo (8/18) with EF 20-25%, mild LVH, moderate LV dilation, mild MR.  - CPX (8/18) with peak VO2 12.4, VE/VCO2 slope 35, RER 1.05 =>severe HR limitation.  - Heartmate 3 LVAD 10/18.  - Pump thrombosis in setting of HM3 outflow graft kink.  Pump exchange 6/19 for HM3.  2. CAD: Anterior MI 2005, had bifurcation stenting LAD/diagonal. Repeat cath 2010 with patent stents.  - NSTEMI 3/18: LHC with 99% ulcerated lesion proximal RCA, 95% mid LAD =>PCI with DES to proximal RCA and mid LAD.  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  - Lower extremity venous dopplers (8/18):  chronic DVT - V/Q scan (8/18): Normal, no PE.  4. Crohns disease: History of colectomy.  5. GERD 6. Diabetic gastroparesis: Admission 11/18 with abdominal pain, nausea/vomiting.  - [facility] workup 2019: Gastric emptying study near normal.  Negative breath test (no evidence for small bowel bacterial overgrowth).  Electrogastrogram showed poor gastric accomodation/capacity, "bradygastria."  7. HTN 8. Hyperlipidemia 9. Type II diabetes  10. ESRD: Post-op LVAD surgery.  He has had failed AV fistula and AV graft, now dialyzing via catheter . 11. Carotid dopplers (8/18): mild bilateral stenosis.  12. PFTs (8/18): Mild restriction (no IDL on CT chest).  13. Lung nodules: CT chest 8/18 with small bilateral nodules.  14. PAD: ABIs with moderate bilateral disease in 8/18.  15. Atrial flutter: In 11/18 associated with nausea/vomiting from gastroparesis.   Past Surgical History: Past Surgical History:  Procedure Laterality Date  . A/V SHUNTOGRAM N/A 05/24/2018   Procedure: A/V SHUNTOGRAM;  Surgeon: Marty Heck, MD;  Location: [place] [facility];  Service: Cardiovascular;  Laterality: N/A;  . APPENDECTOMY    . AV FISTULA PLACEMENT Right 06/26/2017   Procedure: ARTERIOVENOUS (AV) FISTULA CREATION VERSUS GRAFT RIGHT  ARM;  Surgeon: Conrad Oxford, MD;  Location: [place];  Service: Vascular;  Laterality: Right;  . AV FISTULA PLACEMENT Right 10/31/2017   Procedure: INSERTION OF ARTERIOVENOUS (AV) ARTEGRAFT,  RIGHT UPPER ARM;  Surgeon: Conrad Ludlow, MD;  Location: [place];  Service: Vascular;  Laterality: Right;  . AV FISTULA PLACEMENT Left 05/29/2018   Procedure: ARTERIOVENOUS (AV) FISTULA CREATION  LEFT UPPER EXTREMITY;  Surgeon: Waynetta Sandy, MD;  Location: [place];  Service: Vascular;  Laterality: Left;  . AV FISTULA PLACEMENT Left 08/09/2018   Procedure: INSERTION OF ARTERIOVENOUS (AV) GORE-TEX GRAFT LEFT UPPER ARM;  Surgeon: Waynetta Sandy, MD;  Location: [place];  Service: Vascular;   Laterality: Left;  . CARDIAC DEFIBRILLATOR PLACEMENT  2010   St. Jude ICD  . CENTRAL LINE INSERTION Right 05/24/2018   Procedure: CENTRAL LINE INSERTION;  Surgeon: Marty Heck, MD;  Location: [place] [facility];  Service: Cardiovascular;  Laterality: Right;  . COLECTOMY  ~ 2003   "for Crohn's" ascending colon.    . CORONARY STENT INTERVENTION N/A 11/25/2016  Procedure: Coronary Stent Intervention;  Surgeon: Leonie Man, MD;  Location: [place] [facility];  Service: Cardiovascular;  Laterality: N/A;  . EP IMPLANTABLE DEVICE N/A 09/14/2016   Procedure: ICD Generator Changeout;  Surgeon: Deboraha Sprang, MD;  Location: [place] [facility];  Service: Cardiovascular;  Laterality: N/A;  . ESOPHAGOGASTRODUODENOSCOPY N/A 07/12/2017   Procedure: ESOPHAGOGASTRODUODENOSCOPY (EGD);  Surgeon: Jerene Bears, MD;  Location: [facility] ENDOSCOPY;  Service: Gastroenterology;  Laterality: N/A;  VAD pt so VAD team will need to accompany pt.   [name] FETAL SURGERY FOR CONGENITAL HERNIA     ????? pt has no knowledge of this..    . FISTULOGRAM Left 08/09/2018   Procedure: FISTULOGRAM LEFT ARM;  Surgeon: Waynetta Sandy, MD;  Location: [place];  Service: Vascular;  Laterality: Left;  . INGUINAL HERNIA REPAIR    . INSERTION OF DIALYSIS CATHETER Right 06/26/2017   Procedure: INSERTION OF TUNNELED  DIALYSIS CATHETER RIGHT INTERNAL JUGULAR;  Surgeon: Conrad Magee, MD;  Location: [place];  Service: Vascular;  Laterality: Right;  . INSERTION OF IMPLANTABLE LEFT VENTRICULAR ASSIST DEVICE N/A 06/12/2017   Procedure: INSERTION OF IMPLANTABLE LEFT VENTRICULAR ASSIST [place] 3;  Surgeon: Ivin Poot, MD;  Location: [place];  Service: Open Heart Surgery;  Laterality: N/A;  HM3 LVAD  CIRC ARREST  NITRIC OXIDE  . INSERTION OF IMPLANTABLE LEFT VENTRICULAR ASSIST DEVICE N/A 02/28/2018   Procedure: REDO INSERTION OF IMPLANTABLE LEFT VENTRICULAR ASSIST DEVICE - HEARTMATE 3;  Surgeon: Ivin Poot, MD;  Location: [place];  Service: Open Heart Surgery;  Laterality: N/A;  . IR REMOVAL TUN CV CATH W/O FL  02/26/2018  . IR THORACENTESIS ASP PLEURAL SPACE W/IMG GUIDE  06/28/2017  . LIGATION OF ARTERIOVENOUS  FISTULA Right 10/31/2017   Procedure: LIGATION OF BRACHIO-BASILIC VEIN TRANSPOSITION RIGHT ARM;  Surgeon: Conrad Hardin, MD;  Location: [place];  Service: Vascular;  Laterality: Right;  . MULTIPLE EXTRACTIONS WITH ALVEOLOPLASTY N/A 06/08/2017   Procedure: MULTIPLE EXTRACTION WITH ALVEOLOPLASTY AND PRE PROSTHETIC SURGERY AS NEEDED;  Surgeon: Lenn Cal, DDS;  Location: [place];  Service: Oral Surgery;  Laterality: N/A;  . RIGHT HEART CATH N/A 06/07/2017   Procedure: RIGHT HEART CATH;  Surgeon: Larey Dresser, MD;  Location: [place] [facility];  Service: Cardiovascular;  Laterality: N/A;  . RIGHT HEART CATH N/A 02/08/2018   Procedure: RIGHT HEART CATH;  Surgeon: Larey Dresser, MD;  Location: [place] [facility];  Service: Cardiovascular;  Laterality: N/A;  . RIGHT HEART CATH N/A 08/09/2018   Procedure: RIGHT HEART CATH;  Surgeon: Larey Dresser, MD;  Location: [place] [facility];  Service: Cardiovascular;  Laterality: N/A;  . RIGHT/LEFT HEART CATH AND CORONARY ANGIOGRAPHY N/A 11/23/2016   Procedure: Right/Left Heart Cath and Coronary Angiography;  Surgeon: Larey Dresser, MD;  Location: [place] [facility];  Service: Cardiovascular;  Laterality: N/A;  . TEE WITHOUT CARDIOVERSION N/A 06/12/2017   Procedure: TRANSESOPHAGEAL ECHOCARDIOGRAM (TEE);  Surgeon: Prescott Gum, Collier Salina, MD;  Location: [place];  Service: Open Heart Surgery;  Laterality: N/A;  . TEE WITHOUT CARDIOVERSION N/A 02/28/2018   Procedure: TRANSESOPHAGEAL ECHOCARDIOGRAM (TEE);  Surgeon: Prescott Gum, Collier Salina, MD;  Location: [place];  Service: Open Heart Surgery;  Laterality: N/A;  . TEMPORARY DIALYSIS CATHETER Left 05/24/2018   Procedure: TEMPORARY DIALYSIS CATHETER;  Surgeon: Marty Heck, MD;  Location: [place] [facility];  Service: Cardiovascular;   Laterality: Left;  . THROMBECTOMY AND REVISION OF ARTERIOVENTOUS (  AV) GORETEX  GRAFT Right 12/12/2017   Procedure: THROMBECTOMY ARTERIOVENTOUS (AV) GORETEX  GRAFT RIGHT UPPER ARM;  Surgeon: Conrad Pemberton, MD;  Location: [facility] OR;  Service: Vascular;  Laterality: Right;    Family History: Family History  Problem Relation Age of Onset  . Colon polyps Mother   . Diabetes Mother   . Heart attack Father   . Diabetes Father   . Stroke Paternal Grandfather   . Colitis Sister   . Heart disease Brother   . Colitis Sister   . Colon cancer Neg Hx     Social History: Social History   Socioeconomic History  . Marital status: Married    Spouse name: Not on file  . Number of children: Not on file  . Years of education: Not on file  . Highest education level: Not on file  Occupational History  . Not on file  Social Needs  . Financial resource strain: Not on file  . Food insecurity    Worry: Not on file    Inability: Not on file  . Transportation needs    Medical: Not on file    Non-medical: Not on file  Tobacco Use  . Smoking status: Never Smoker  . Smokeless tobacco: Never Used  Substance and Sexual Activity  . Alcohol use: No  . Drug use: No  . Sexual activity: Never  Lifestyle  . Physical activity    Days per week: Not on file    Minutes per session: Not on file  . Stress: Not on file  Relationships  . Social Herbalist on phone: Not on file    Gets together: Not on file    Attends religious service: Not on file    Active member of club or organization: Not on file    Attends meetings of clubs or organizations: Not on file    Relationship status: Not on file  Other Topics Concern  . Not on file  Social History Narrative  . Not on file    Allergies:  Allergies  Allergen Reactions  . Metformin And Related Diarrhea    Objective:    Vital Signs:   HR 90s NSR, T 99  Mean arterial Pressure 92  Physical Exam: General:  Well appearing. No resp difficulty  HEENT: normal Neck: supple. JVP not elevated. Carotids 2+ bilat; no bruits. No lymphadenopathy or thryomegaly appreciated. Cor: Mechanical heart sounds with LVAD hum present. Lungs: clear Abdomen: soft, nontender, nondistended. No hepatosplenomegaly. No bruits or masses. Good bowel sounds. Driveline: C/D/I; securement device intact and driveline incorporated Extremities: Feet are warm, there is an ulcer on the right 4th toe Neuro: alert & orientedx3, cranial nerves grossly intact. moves all 4 extremities w/o difficulty. Affect pleasant  Telemetry: NSR 90s (personally reviewed)  Labs: Basic Metabolic Panel: No results for input(s): NA, K, CL, CO2, GLUCOSE, BUN, CREATININE, CALCIUM, MG, PHOS in the last 168 hours.  Liver Function Tests: No results for input(s): AST, ALT, ALKPHOS, BILITOT, PROT, ALBUMIN in the last 168 hours. No results for input(s): LIPASE, AMYLASE in the last 168 hours. No results for input(s): AMMONIA in the last 168 hours.  CBC: No results for input(s): WBC, NEUTROABS, HGB, HCT, MCV, PLT in the last 168 hours.  Cardiac Enzymes: No results for input(s): CKTOTAL, CKMB, CKMBINDEX, TROPONINI in the last 168 hours.  BNP: BNP (last 3 results) No results for input(s): BNP in the last 8760 hours.  ProBNP (last 3 results) No results for input(s):  PROBNP in the last 8760 hours.   CBG: No results for input(s): GLUCAP in the last 168 hours.  Coagulation Studies: Recent Labs    03/15/19  INR 1.9*    Other results: EKG: Pending  Imaging:  No results found.      Plan/Discussion:    1. Right foot pain: Ulcer on 4th toe.  I am concerned for ischemic foot.  Severe pain.  Feet are warm but unable to doppler pulse on right. He has known PAD, last dopplers in 2018 showed moderate PAD bilaterally.  He is afebrile, no definite evidence for infection.  - I will arrange for peripheral arterial doppler evaluation.  - Plain films right foot.  - Heparin gtt when INR  < 2.  - Fentanyl IV for pain control (itches with morphine).  - VVS knows about patient, tentatively scheduled for arteriogram on Monday.  - Wound consult for foot ulcer.  2. Chronic systolic CHF: Ischemic cardiomyopathy. St Jude ICD. NSTEMI in March 2018 with DES to LAD and RCA, complicated by cardiogenic shock, low output requiring milrinone. Echo 8/18 with EF 20-25%, moderate MR. CPX (8/18) with severe functional impairment due to HF. He is s/p HM3 LVAD placement. He had pump thrombosis 6/19 due to [facility] outflow graft kinking.  He had pump exchange for another HM3.   - Volume managed at dialysis, had today.  - Continue ASA 81.  Hold warfarin for arteriogram, start heparin gtt when INR < 2.  Pending labs today.   - He has been referred to [facility] for evaluation for heart/kidney transplant and recently saw Dr Mosetta Pigeon.  3. ESRD: Developed peri-operatively after HM3 placement.  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  - He is currently looking for a living donor.  4. CAD: NSTEMI in 3/18. LHC with 99% ulcerated lesion proximal RCA with left to right collaterals, 95% mid LAD stenosis after mid LAD stent. s/p PCI to RCA and LAD on 11/25/16.  -Continue statin and asa.  4. h/o DVTs: Factor V Leiden heterozygote. - Anticoagulated.  5. Diabetic gastroparesis: Difficult to manage. He has seen gastroparesis specialist at [facility]. Surprisingly, gastric emptying study was normal.  However, electrogastrogram showed poor gastric accomodation/capacity.  Therefore, small frequent meals recommended.  Recently doing better.    - Continue reglan 5 mg tid/ac and Marinol.  - Continue Protonix bid.  - He will continue erythromycin indefinitely.  6. DM: Cover with SSI and home glargine.  7. HTN: MAP has been lower with dialysis, now off Bidil and getting midodrine prior to HD.    8. Atrial flutter: Paroxysmal.  He has been in NSR recently.   - Continue  amiodarone.   9. Left hand tingling and grip weakness: Present since he developed left arm hematoma from failed AV graft.   10. Difficult IV access: Place PICC line.    I reviewed the LVAD parameters from today, and compared the results to the patient's prior recorded data.  No programming changes were made.  The LVAD is functioning within specified parameters.  The patient performs LVAD self-test daily.  LVAD interrogation was negative for any significant power changes, alarms or PI events/speed drops.  LVAD equipment check completed and is in good working order.  Back-up equipment present.   LVAD education done on emergency procedures and precautions and reviewed exit site care.  Length of Stay: 0  [name] 03/15/2019, 5:08 PM  VAD Team Pager [phone] (7am -  7am) +++VAD ISSUES ONLY+++   Advanced Heart Failure Team Pager [phone] (M-F; 7a - 4p)  Please contact [facility] for night-coverage after hours (4p -7a ) and weekends on [email or web address] for all non- LVAD Issues

## 2019-03-15 NOTE — Assessment & Plan Note (Addendum)
Concern for severe ischemia of right foot given pain during rest, exquisite tenderness to palpation, and peeling of skin of fourth toe.  ABI's could not be performed due to his LVAD.  Dr. Donzetta Matters of vascular surgery was consulted and recommended admission to the hospital and initiation of antibiotics.  He will notify his partner Dr. Oneida Alar, who is on-call this weekend, and they will likely do an arteriogram.  Since the patient has an LVAD, he will be admitted to Dr. Claris Gladden service.  Patient was notified of this plan and was in agreement.

## 2019-03-15 NOTE — Progress Notes (Signed)
Spoke with Apolonio Schneiders, RN regarding PICC order.  Patient is being followed by nephrology and would need to have clearance from them prior to placing PICC.  PICC would not be able to be placed in arms with old grafts.  PICC will be placed on 03-16-19 if ok obtained from nephrology and arm with usable vein is available.  If central line is needed prior to 03-16-19 please notify MD of need to place.  Carolee Rota, RN VAST

## 2019-03-15 NOTE — Progress Notes (Addendum)
Subjective:    [name] - 53 y.o. male MRN [id number]  Date of birth: May 26, 1966  CC:  [name] is here for foot pain.  HPI:  [name] reports that for the past month, he has had right foot pain that is worse on his third and fourth toes.  His pain extends to the dorsal surface of his right foot.  Pressure on the foot and moving the foot up or down increases his pain.  His toes also appear darker.  On July 8, when he was removing his sock, he noticed that his skin was peeling off of his fourth toe.  He has been told in the past that the circulation in his feet is not good.  He has an LVAD and therefore his pulses are not palpable.  He denies constitutional symptoms such as fever and loss of appetite.     Health Maintenance:  Health Maintenance Due  Topic Date Due  . PNEUMOCOCCAL POLYSACCHARIDE VACCINE AGE 69-64 HIGH RISK  09/29/1966  . OPHTHALMOLOGY EXAM  09/29/1974  . FOOT EXAM  03/06/2018  . URINE MICROALBUMIN  03/06/2018  . HEMOGLOBIN A1C  12/07/2018    -  reports that he has never smoked. He has never used smokeless tobacco. - Review of Systems: Per HPI. - Past Medical History: Patient Active Problem List   Diagnosis Date Noted  . Ischemic pain of right foot 03/15/2019  . Subtherapeutic international normalized ratio (INR) 08/28/2018  . Elevated LDH 08/22/2018  . Acute vomiting   . Nausea & vomiting 06/11/2018  . Heart failure ([place]) 05/23/2018  . Hypertensive urgency 03/25/2018  . Benign essential HTN   . Crohn's disease with complication ([place])   . Diabetes mellitus type 2 in nonobese (H[facility])   . Anemia of chronic disease   . Sinus tachycardia   . Leukocytosis   . Left ventricular assist device (LVAD) complication 01/75/1025  . Left ventricular assist device (LVAD) complication, initial encounter 02/28/2018  . Factor V Leiden carrier ([place]) 01/10/2018  . History of creation of ostomy ([place]) 01/10/2018  . Hypercoagulable state ([place]) 01/10/2018  .  ESRD (end stage renal disease) ([place]) 12/11/2017  . Left ventricular assist device present ([place]) 12/05/2017  . ESRD (end stage renal disease) on dialysis ([place]) 10/29/2017  . Nausea with vomiting 09/12/2017  . Intractable nausea and vomiting 07/31/2017  . 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) ([place]) 07/10/2017  . LVAD (left ventricular assist device) present ([place])   . Palliative care by specialist   . Chronic systolic CHF (congestive heart failure) ([place]) 06/07/2017  . ACP (advance care planning)   . Encounter for central line placement   . Diarrhea   . Palliative care encounter   . Nausea   . CHF exacerbation ([place]) 04/05/2017  . Diabetic keto-acidosis ([place]) 04/05/2017  . Type 2 diabetes mellitus with complication, with long-term current use of insulin ([place]) 03/08/2017  . ESRD on dialysis ([place]) 01/17/2017  . AKI (acute kidney injury) ([place])   . Non-ST elevation (NSTEMI) myocardial infarction ([place])   . CHF (congestive heart failure) ([place]) 09/02/2016  . Elevated serum creatinine   . Encounter for therapeutic drug monitoring 08/03/2016  . Chest pain 04/26/2012  . SVT (supraventricular tachycardia) ([place]) 04/26/2012  . Gastroparesis 11/18/2011  . Nausea and vomiting in adult 11/17/2011  . Coronary artery disease   . Ischemic cardiomyopathy   . Essential hypertension   . Automatic implantable cardioverter-defibrillator in situ   . Deep venous thrombosis (  [place])   . Hypersomnolent   . POLYP, COLON 09/25/2008  . Type 2 diabetes mellitus ([place]) 09/25/2008  . Dyslipidemia, goal LDL below 70 09/25/2008  . GASTRITIS 09/25/2008  . DIVERTICULOSIS, COLON 09/25/2008  . RECTAL MASS 09/25/2008  . History of Crohn's disease 09/05/2006   - Medications: reviewed and updated   Objective:   Physical Exam BP 124/80   Pulse 60   SpO2 99%  Gen: NAD, alert, cooperative with exam, thin, appears chronically ill CV: no edema, right foot is notably cooler than the left, DP and PT pulses unable  to be palpated bilaterally Resp: no wheezes, non-labored Skin: Superficial, erythematous abrasion on the right fourth toe with erythema extending to the distal dorsal area of the foot, slight darkening of toes of R foot, toes and dorsum of R foot is exquisitely tender to palpation Psych: good insight, alert and oriented     Assessment & Plan:   Ischemic pain of right foot Concern for severe ischemia of right foot given pain during rest, exquisite tenderness to palpation, and peeling of skin of fourth toe.  ABI's could not be performed due to his LVAD.  Dr. Donzetta Matters of vascular surgery was consulted and recommended admission to the hospital and initiation of antibiotics.  He will notify his partner Dr. Oneida Alar, who is on-call this weekend, and they will likely do an arteriogram.  Since the patient has an LVAD, he will be admitted to Dr. Claris Gladden service.  Patient was notified of this plan and was in agreement.    Maia Breslow, M.D. 03/15/2019, 4:00 PM PGY-3, [place] [facility]

## 2019-03-16 ENCOUNTER — Encounter (HOSPITAL_COMMUNITY): Payer: Self-pay

## 2019-03-16 DIAGNOSIS — I70235 Atherosclerosis of native arteries of right leg with ulceration of other part of foot: Secondary | ICD-10-CM

## 2019-03-16 DIAGNOSIS — Z95811 Presence of heart assist device: Secondary | ICD-10-CM

## 2019-03-16 DIAGNOSIS — N185 Chronic kidney disease, stage 5: Secondary | ICD-10-CM

## 2019-03-16 LAB — BASIC METABOLIC PANEL
Anion gap: 15 (ref 5–15)
BUN: 35 mg/dL — ABNORMAL HIGH (ref 6–20)
CO2: 23 mmol/L (ref 22–32)
Calcium: 9.3 mg/dL (ref 8.9–10.3)
Chloride: 96 mmol/L — ABNORMAL LOW (ref 98–111)
Creatinine, Ser: 6.88 mg/dL — ABNORMAL HIGH (ref 0.61–1.24)
GFR calc Af Amer: 10 mL/min — ABNORMAL LOW (ref >60)
GFR calc non Af Amer: 8 mL/min — ABNORMAL LOW (ref >60)
Glucose, Bld: 152 mg/dL — ABNORMAL HIGH (ref 70–99)
Potassium: 3.8 mmol/L (ref 3.5–5.1)
Sodium: 134 mmol/L — ABNORMAL LOW (ref 135–145)

## 2019-03-16 LAB — CBC
HCT: 34.7 % — ABNORMAL LOW (ref 39.0–52.0)
Hemoglobin: 10.9 g/dL — ABNORMAL LOW (ref 13.0–17.0)
MCH: 29.7 pg (ref 26.0–34.0)
MCHC: 31.4 g/dL (ref 30.0–36.0)
MCV: 94.6 fL (ref 80.0–100.0)
Platelets: 198 10*3/uL (ref 150–400)
RBC: 3.67 MIL/uL — ABNORMAL LOW (ref 4.22–5.81)
RDW: 14.6 % (ref 11.5–15.5)
WBC: 11.7 10*3/uL — ABNORMAL HIGH (ref 4.0–10.5)
nRBC: 0 % (ref 0.0–0.2)

## 2019-03-16 LAB — PROTIME-INR
INR: 1.9 — ABNORMAL HIGH (ref 0.8–1.2)
Prothrombin Time: 21.7 seconds — ABNORMAL HIGH (ref 11.4–15.2)

## 2019-03-16 LAB — GLUCOSE, CAPILLARY
Glucose-Capillary: 154 mg/dL — ABNORMAL HIGH (ref 70–99)
Glucose-Capillary: 185 mg/dL — ABNORMAL HIGH (ref 70–99)
Glucose-Capillary: 111 mg/dL — ABNORMAL HIGH (ref 70–99)
Glucose-Capillary: 114 mg/dL — ABNORMAL HIGH (ref 70–99)

## 2019-03-16 LAB — URIC ACID: Uric Acid, Serum: 5 mg/dL (ref 3.7–8.6)

## 2019-03-16 LAB — LACTATE DEHYDROGENASE: LDH: 185 U/L (ref 98–192)

## 2019-03-16 LAB — HEPARIN LEVEL (UNFRACTIONATED)
Heparin Unfractionated: 0.63 IU/mL (ref 0.30–0.70)
Heparin Unfractionated: 0.69 IU/mL (ref 0.30–0.70)

## 2019-03-16 MED ORDER — PROMETHAZINE HCL 25 MG/ML IJ SOLN
12.5000 mg | Freq: Four times a day (QID) | INTRAMUSCULAR | Status: DC | PRN
  Administered 2019-03-16 – 2019-03-22 (×3): 12.5 mg via INTRAVENOUS
  Filled 2019-03-16 (×5): qty 1

## 2019-03-16 MED ORDER — CHLORHEXIDINE GLUCONATE CLOTH 2 % EX PADS
6.0000 | MEDICATED_PAD | Freq: Every day | CUTANEOUS | Status: DC
  Administered 2019-03-21: 6 via TOPICAL

## 2019-03-16 MED ORDER — SODIUM CHLORIDE 0.9% FLUSH: 10.0000 mL | INTRAVENOUS | Status: DC | PRN

## 2019-03-16 MED ORDER — SODIUM CHLORIDE 0.9% FLUSH
10.0000 mL | Freq: Two times a day (BID) | INTRAVENOUS | Status: DC
  Administered 2019-03-16 – 2019-03-19 (×7): 10 mL
  Administered 2019-03-19: 30 mL
  Administered 2019-03-20: 10 mL
  Administered 2019-03-21: 20 mL
  Administered 2019-03-21: 10:00:00 30 mL
  Administered 2019-03-22: 20 mL

## 2019-03-16 MED ORDER — CHLORHEXIDINE GLUCONATE CLOTH 2 % EX PADS
6.0000 | MEDICATED_PAD | Freq: Every day | CUTANEOUS | Status: DC
  Administered 2019-03-17 – 2019-03-19 (×2): 6 via TOPICAL

## 2019-03-16 MED ORDER — PROMETHAZINE HCL 25 MG PO TABS: 12.5000 mg | ORAL_TABLET | Freq: Four times a day (QID) | ORAL | Status: DC | PRN

## 2019-03-16 NOTE — Progress Notes (Signed)
Patient ID: [name], male   DOB: Feb 26, 1966, 53 y.o.   MRN: [id number]   Advanced Heart Failure VAD Team Note  PCP-Cardiologist: No primary care provider on file.   Subjective:    No complaints this morning.  Toe pain/foot pain is controlled.  No abdominal pain/nausea.   Vascular US 7/10 difficult to interpret given LVAD.   Right foot plain film unremarkable.   LVAD INTERROGATION:  HeartMate 3 LVAD:   Flow 3.6 liters/min, speed 5500, power 4.3, PI 6.1.  Several PI events overnight.    Objective:    Vital Signs:   Temp:  [97.8 F (36.6 C)-99 F (37.2 C)] 97.8 F (36.6 C) (07/11 0700) Pulse Rate:  [74-176] 74 (07/11 0815) Resp:  [10-28] 10 (07/11 0815) BP: (80-109)/(64-92) 80/70 (07/11 0815) SpO2:  [87 %-100 %] 92 % (07/11 0815) Weight:  [82.7 kg] 82.7 kg (07/11 0500)   Mean arterial Pressure 75  Intake/Output:   Intake/Output Summary (Last 24 hours) at 03/16/2019 0835 Last data filed at 03/16/2019 0800 Gross per 24 hour  Intake 490.43 ml  Output -  Net 490.43 ml     Physical Exam    General:  Well appearing. No resp difficulty HEENT: normal Neck: supple. JVP not elevated. Carotids 2+ bilat; no bruits. No lymphadenopathy or thyromegaly appreciated. Cor: Mechanical heart sounds with LVAD hum present. Lungs: clear Abdomen: soft, nontender, nondistended. No hepatosplenomegaly. No bruits or masses. Good bowel sounds. Driveline: C/D/I; securement device intact and driveline incorporated Extremities: no cyanosis, clubbing, rash, edema.  Right 4th toe with ulceration.  Neuro: alert & orientedx3, cranial nerves grossly intact. moves all 4 extremities w/o difficulty. Affect pleasant   Telemetry   NSR 80s (personally reviewed)   Labs   Basic Metabolic Panel: Recent Labs  Lab 03/15/19 1807 03/16/19 0633  NA 135 134*  K 3.7 3.8  CL 97* 96*  CO2 24 23  GLUCOSE 130* 152*  BUN 19 35*  CREATININE 5.34* 6.88*  CALCIUM 10.0 9.3    Liver Function Tests:  Recent Labs  Lab 03/15/19 1807  AST 21  ALT 18  ALKPHOS 101  BILITOT 0.7  PROT 7.6  ALBUMIN 3.5   No results for input(s): LIPASE, AMYLASE in the last 168 hours. No results for input(s): AMMONIA in the last 168 hours.  CBC: Recent Labs  Lab 03/15/19 1807 03/16/19 0633  WBC 12.7* 11.7*  NEUTROABS 9.4*  --   HGB 11.6* 10.9*  HCT 35.3* 34.7*  MCV 91.7 94.6  PLT 197 198    INR: Recent Labs  Lab 03/15/19 03/15/19 1807  INR 2.9  1.9* 1.8*    Other results:  EKG:    Imaging   Dg Chest Port 1 View  Result Date: 03/15/2019 CLINICAL DATA:  Central line infiltration EXAM: PORTABLE CHEST 1 VIEW COMPARISON:  08/29/2018 FINDINGS: Cardiac shadow is stable. LVAD is again identified. Defibrillator is again seen. Right jugular central line is noted in the cavoatrial junction. Left dialysis catheter is seen. No pneumothorax is noted. Lungs are clear and stable. No bony abnormality is seen. IMPRESSION: Tubes and lines as described above.  No acute abnormality noted. Electronically Signed   By: Inez Catalina M.D.   On: 03/15/2019 21:05   Dg Foot Complete Right  Result Date: 03/15/2019 CLINICAL DATA:  Right foot pain EXAM: RIGHT FOOT COMPLETE - 3+ VIEW COMPARISON:  03/11/2019 FINDINGS: No acute fracture or dislocation is noted. Diffuse vascular calcifications are again seen. No soft tissue abnormality is noted.  Mild osteopenia is again noted. IMPRESSION: Stable appearance of the right foot.  No acute abnormality noted. Electronically Signed   By: Inez Catalina M.D.   On: 03/15/2019 21:07   Vas [place] Lower Extremity Arterial Duplex  Result Date: 03/15/2019 LOWER EXTREMITY ARTERIAL DUPLEX STUDY Indications: Non-palpable pulses. High Risk Factors: Hypertension, hyperlipidemia, Diabetes, coronary artery                    disease. Other Factors: LVAD.  Current ABI: Not needed per ordering physician Performing Technologist: Maudry Mayhew, MHA, RDMS, RVT, RDCS  Examination Guidelines: A  complete evaluation includes B-mode imaging, spectral Doppler, color Doppler, and power Doppler as needed of all accessible portions of each vessel. Bilateral testing is considered an integral part of a complete examination. Limited examinations for reoccurring indications may be performed as noted.  +-----------+-----------------------------------------+ RIGHT      Comments                                  +-----------+-----------------------------------------+ CFA Distal Patent                                    +-----------+-----------------------------------------+ DFA        Patent                                    +-----------+-----------------------------------------+ SFA Prox   Patent                                    +-----------+-----------------------------------------+ SFA Mid    Patent, moderate smooth homogenous plaque +-----------+-----------------------------------------+ SFA Distal Patent, mild smooth heterogenous plaque   +-----------+-----------------------------------------+ POP Distal Patent                                    +-----------+-----------------------------------------+ TP Trunk   Patent                                    +-----------+-----------------------------------------+ ATA Distal Patent                                    +-----------+-----------------------------------------+ PTA Distal Patent                                    +-----------+-----------------------------------------+ PERO DistalPatent                                    +-----------+-----------------------------------------+  +-----------+---------------------------------------+ LEFT       Comments                                +-----------+---------------------------------------+ CFA Distal Patent                                  +-----------+---------------------------------------+  DFA        Patent                                   +-----------+---------------------------------------+ SFA Prox   Patent                                  +-----------+---------------------------------------+ SFA Mid    Patent                                  +-----------+---------------------------------------+ SFA Distal Patent, mild smooth heterogenous plaque +-----------+---------------------------------------+ POP Distal Patent                                  +-----------+---------------------------------------+ TP Trunk   Unable to visualize                     +-----------+---------------------------------------+ ATA Distal Patent                                  +-----------+---------------------------------------+ PTA Distal Patent                                  +-----------+---------------------------------------+ PERO DistalPatent                                  +-----------+---------------------------------------+  Summary: Bilateral: No obvious evidence of ischemia. All visualized arteries are patent. *Unable to estimate stenoses by velocity recording due to presence of LVAD.  See table(s) above for measurements and observations.    Preliminary    [place] Ekg Site Rite  Result Date: 03/15/2019 If Site Rite image not attached, placement could not be confirmed due to current cardiac rhythm.     Medications:     Scheduled Medications: . amiodarone  200 mg Oral Daily  . aspirin EC  81 mg Oral Daily  . atorvastatin  40 mg Oral q1800  . [START ON 03/18/2019] calcitRIOL  0.5 mcg Oral Q M,W,F  . Chlorhexidine Gluconate Cloth  6 each Topical Daily  . Chlorhexidine Gluconate Cloth  6 each Topical Daily  . citalopram  20 mg Oral Daily  . dronabinol  2.5 mg Oral BID AC  . erythromycin  250 mg Oral TID  . gabapentin  100 mg Oral BID  . insulin aspart  0-15 Units Subcutaneous TID WC  . insulin aspart  0-5 Units Subcutaneous QHS  . insulin aspart  7 Units Subcutaneous QPM  . insulin glargine  5 Units Subcutaneous Daily   . metoCLOPramide  10 mg Oral TID  . [START ON 03/18/2019] midodrine  5 mg Oral Q M,W,F  . multivitamin  1 tablet Oral Daily  . pantoprazole  40 mg Oral BID  . sevelamer carbonate  1,600 mg Oral BID WC  . sevelamer carbonate  4,000 mg Oral TID WC  . sodium chloride flush  10-40 mL Intracatheter Q12H     Infusions: . heparin 1,150 Units/hr (03/16/19 0800)     PRN Medications:  acetaminophen, docusate sodium, fentaNYL (SUBLIMAZE) injection, lidocaine-prilocaine, LORazepam,  ondansetron (ZOFRAN) IV, promethazine, sodium chloride flush, traMADol    Assessment/Plan:    1. Right foot pain: Ulcer on 4th toe.  I am concerned for ischemic foot.  Severe pain, now better with IV Fentanyl.  Feet are warm but unable to doppler pulse on right. He has known PAD, last dopplers in 2018 showed moderate PAD bilaterally.  He is afebrile, no definite evidence for infection.  Arterial dopplers difficult to interpret with LVAD, plain film of foot unremarkable.  Uric acid not elevated.  - Continue heparin gtt.  - Fentanyl IV for pain control (itches with morphine).  - VVS following, scheduled for arteriogram on Monday.  - Wound consult for foot ulcer.  2. Chronic systolic CHF: Ischemic cardiomyopathy. [employer] ICD. NSTEMI in March 2018 with DES to LAD and RCA, complicated by cardiogenic shock, low output requiring milrinone. Echo 8/18 with EF 20-25%, moderate MR. CPX (8/18) with severe functional impairment due to HF. He is s/p HM3 LVAD placement. He had pump thrombosis 6/19 due to [facility] outflow graft kinking. He had pump exchange for another HM3.  - Volume managed at dialysis, had last on Friday.  - Continue ASA 81. Holding warfarin for arteriogram, now on heparin gtt.  - [facility] for heart/kidney transplantand recently saw Dr Mosetta Pigeon. 3. ESRD: Developed peri-operatively after HM3 placement. He is tolerating HD, currently via catheter as he has failed AV fistula and  AV graft placement.  -He is currently looking for a living donor. 4. CAD: NSTEMI in 3/18. LHC with 99% ulcerated lesion proximal RCA with left to right collaterals, 95% mid LAD stenosis after mid LAD stent. s/p PCI to RCA and LAD on 11/25/16.  -Continue statin and ASA. 4. h/o DVTs: Factor V Leiden heterozygote. - Anticoagulated.  5. Diabetic gastroparesis: Difficult to manage. He has seen gastroparesis specialist at [facility]. Surprisingly, gastric emptying study was normal. However, electrogastrogram showed poor gastric accomodation/capacity. Therefore, small frequent meals recommended. Recently doing better.   - Continue reglan 5 mg tid/ac and Marinol.  - Continue Protonix bid.  - He will continue erythromycinindefinitely.  6. DM: Cover with SSI and home glargine.  7. HTN:MAP has been lower with dialysis, now off Bidil and getting midodrine prior to HD.  8. Atrial flutter: Paroxysmal. He has been in NSR recently.  - Continue amiodarone. 9. Left hand tingling and grip weakness: Present since he developed left arm hematoma from failed AV graft. 10. Difficult IV access: Placed IJ.   I reviewed the LVAD parameters from today, and compared the results to the patient's prior recorded data.  No programming changes were made.  The LVAD is functioning within specified parameters.  The patient performs LVAD self-test daily.  LVAD interrogation was negative for any significant power changes, alarms or PI events/speed drops.  LVAD equipment check completed and is in good working order.  Back-up equipment present.   LVAD education done on emergency procedures and precautions and reviewed exit site care.  Length of Stay: 1  Loralie Champagne, MD 03/16/2019, 8:35 AM  VAD Team --- VAD ISSUES ONLY--- Pager [phone] (7am - 7am)  Advanced Heart Failure Team  Pager [phone] (M-F; 7a - 4p)  Please contact [facility] for night-coverage after hours (4p -7a ) and weekends on [email or web address]

## 2019-03-16 NOTE — Consult Note (Signed)
[facility] SHORT STAY H&P   Reason for consult non healing toe right foot   Requesting Dr Marigene Ehlers  HPI: PT with 3 week history of nonhealing ulcer right 4th toe.   He has some pain in the foot.  Prior LVAD.  ESRD, hyperlipid, cardiomyopathy, diabetes, CAD.  He is currently on heparin.  Past Medical History:  Diagnosis Date  . Angina   . ASCVD (arteriosclerotic cardiovascular disease)    , Anterior infarction 2005, LAD diagonal bifurcation intervention 03/2004  . Automatic implantable cardiac defibrillator -[employer]       . Benign neoplasm of colon   . CHF (congestive heart failure) ([place])   . Chronic systolic heart failure ([place])   . Coronary artery disease     Widely patent previously placed stents in the left anterior   . Crohn's disease ([place])   . 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 Dialysis patient ([place])   . Dyspnea   . Gastroparesis   . GERD (gastroesophageal reflux disease)   . High cholesterol   . Hyperlipidemia   . Hypersomnolent    Previous diagnosis of narcolepsy  . Hypertension, essential   . Ischemic cardiomyopathy    Ejection fraction 15-20% catheterization 2010  . Type II or unspecified type diabetes mellitus without mention of complication, not stated as uncontrolled   . Unspecified gastritis and gastroduodenitis without mention of hemorrhage     FH:  Non-Contributory  Social HX Social History   Tobacco Use  . Smoking status: Never Smoker  . Smokeless tobacco: Never Used  Substance Use Topics  . Alcohol use: No  . Drug use: No    Allergies Allergies  Allergen Reactions  . Metformin And Related Diarrhea    Medications Current Facility-Administered Medications  Medication Dose Route Frequency Provider Last Rate Last Dose  . acetaminophen (TYLENOL) tablet 650 mg  650 mg Oral Q4H PRN Larey Dresser, MD      . amiodarone (PACERONE) tablet 200 mg  200 mg Oral Daily Larey Dresser, MD   200 mg at  03/15/19 1851  . aspirin EC tablet 81 mg  81 mg Oral Daily Larey Dresser, MD   81 mg at 03/15/19 1850  . atorvastatin (LIPITOR) tablet 40 mg  40 mg Oral q1800 Larey Dresser, MD   40 mg at 03/15/19 1851  . [START ON 03/18/2019] calcitRIOL (ROCALTROL) capsule 0.5 mcg  0.5 mcg Oral Q M,W,F Larey Dresser, MD      . Chlorhexidine Gluconate Cloth 2 % PADS 6 each  6 each Topical Daily Larey Dresser, MD      . Chlorhexidine Gluconate Cloth 2 % PADS 6 each  6 each Topical Daily Larey Dresser, MD      . citalopram (CELEXA) tablet 20 mg  20 mg Oral Daily Larey Dresser, MD   20 mg at 03/15/19 1850  . docusate sodium (COLACE) capsule 100 mg  100 mg Oral Daily PRN Larey Dresser, MD      . dronabinol (MARINOL) capsule 2.5 mg  2.5 mg Oral BID AC Larey Dresser, MD      . erythromycin [facility]) tablet 250 mg  250 mg Oral TID Larey Dresser, MD   250 mg at 03/15/19 2124  . fentaNYL (SUBLIMAZE) injection 25-50 mcg  25-50 mcg Intravenous Q6H PRN Larey Dresser, MD   50 mcg at 03/15/19 2124  . gabapentin (NEURONTIN) capsule 100 mg  100 mg Oral BID Larey Dresser, MD   100 mg at 03/15/19 2124  . heparin ADULT infusion 100 units/mL (25000 units/270m sodium chloride 0.45%)  1,150 Units/hr Intravenous Continuous [place], Yujing Z, RPH 11.5 mL/hr at 03/16/19 0700 1,150 Units/hr at 03/16/19 0700  . insulin aspart (novoLOG) injection 0-15 Units  0-15 Units Subcutaneous TID WC MLarey Dresser MD   3 Units at 03/16/19 0700  . insulin aspart (novoLOG) injection 0-5 Units  0-5 Units Subcutaneous QHS MLarey Dresser MD      . insulin aspart (novoLOG) injection 7 Units  7 Units Subcutaneous QPM MLarey Dresser MD      . insulin glargine (LANTUS) injection 5 Units  5 Units Subcutaneous Daily MLarey Dresser MD      . lidocaine-prilocaine (EMLA) cream 1 application  1 application Topical PRN MLarey Dresser MD      . LORazepam (ATIVAN) tablet 0.5 mg  0.5 mg Oral Q12H PRN MLarey Dresser MD       . metoCLOPramide (REGLAN) tablet 10 mg  10 mg Oral TID MLarey Dresser MD   10 mg at 03/15/19 2124  . [START ON 03/18/2019] midodrine (PROAMATINE) tablet 5 mg  5 mg Oral Q M,W,F MLarey Dresser MD      . multivitamin (RENA-VIT) tablet 1 tablet  1 tablet Oral Daily MLarey Dresser MD   1 tablet at 03/15/19 1850  . ondansetron (ZOFRAN) injection 4 mg  4 mg Intravenous Q6H PRN MLarey Dresser MD   4 mg at 03/16/19 0242  . pantoprazole (PROTONIX) EC tablet 40 mg  40 mg Oral BID MLarey Dresser MD   40 mg at 03/15/19 2124  . promethazine (PHENERGAN) tablet 12.5 mg  12.5 mg Oral Q6H PRN MLarey Dresser MD   12.5 mg at 03/16/19 0234  . sevelamer carbonate (RENVELA) tablet 1,600 mg  1,600 mg Oral BID WC MLarey Dresser MD      . sevelamer carbonate (RENVELA) tablet 4,000 mg  4,000 mg Oral TID WC MLarey Dresser MD      . sodium chloride flush (NS) 0.9 % injection 10-40 mL  10-40 mL Intracatheter Q12H MLarey Dresser MD   10 mL at 03/16/19 0234  . sodium chloride flush (NS) 0.9 % injection 10-40 mL  10-40 mL Intracatheter PRN MLarey Dresser MD      . traMADol (Veatrice Bourbon tablet 50 mg  50 mg Oral Q6H PRN MLarey Dresser MD   50 mg at 03/15/19 1851    Labs  CBC    Component Value Date/Time   WBC 11.7 (H) 03/16/2019 0633   RBC 3.67 (L) 03/16/2019 0633   HGB 10.9 (L) 03/16/2019 0633   HGB 11.7 (L) 09/07/2016 1457   HCT 34.7 (L) 03/16/2019 0633   HCT 37.5 09/07/2016 1457   PLT 198 03/16/2019 0633   PLT 279 09/07/2016 1457   MCV 94.6 03/16/2019 0633   MCV 81 09/07/2016 1457   MCH 29.7 03/16/2019 0633   MCHC 31.4 03/16/2019 0633   RDW 14.6 03/16/2019 0633   RDW 14.9 09/07/2016 1457   LYMPHSABS 1.7 03/15/2019 1807   LYMPHSABS 1.3 09/07/2016 1457   MONOABS 0.7 03/15/2019 1807   EOSABS 0.6 (H) 03/15/2019 1807   EOSABS 0.2 09/07/2016 1457   BASOSABS 0.0 03/15/2019 1807   BASOSABS 0.0 09/07/2016 1457    BMET    Component Value Date/Time   NA 134 (L) 03/16/2019 06546  NA  144 05/11/2017 0938   K 3.8 03/16/2019 0633   CL 96 (L) 03/16/2019 0633   CO2 23 03/16/2019 0633   GLUCOSE 152 (H) 03/16/2019 0633   BUN 35 (H) 03/16/2019 0633   BUN 68 (H) 05/11/2017 0938   CREATININE 6.88 (H) 03/16/2019 0633   CREATININE 1.63 (H) 11/09/2016 1505   CALCIUM 9.3 03/16/2019 0633   CALCIUM 8.7 06/27/2017 1152   GFRNONAA 8 (L) 03/16/2019 0633   GFRNONAA 48 (L) 11/09/2016 1505   GFRAA 10 (L) 03/16/2019 0633   GFRAA 55 (L) 11/09/2016 1505    PHYSICAL EXAM  Vitals:   03/16/19 0600 03/16/19 0700  BP: (!) 103/92 101/68  Pulse:  (!) 132  Resp: 11 11  Temp:  97.8 F (36.6 C)  SpO2:  100%    General:  WDWN in NAD Vascular Exam/Pulses: no palpable pulses but has LVAD Extremities right 4th toe with ulcer between digits slightly dusky, left foot no wounds Neuro A&O x 3; good sensation; motion in all extremities  Impression: non healing wound right foot though duplex is underwhelming difficult to know what velocities mean in LVAD pt toe looks ischemic  Plan: Aortogram with runoff possible intervention Monday Dr Donzetta Matters  NPo p midnight Sunday D/c heparin at 6 am Monday  Celita Aron @TODAY @ 8:06 AM

## 2019-03-16 NOTE — Plan of Care (Signed)

## 2019-03-16 NOTE — Progress Notes (Addendum)
ANTICOAGULATION CONSULT NOTE - Follow Up Consult  Pharmacy Consult for heparin Indication: LVAD  Allergies  Allergen Reactions  . Metformin And Related Diarrhea    Patient Measurements: Weight: 182 lb 5.1 oz (82.7 kg)(vad, without controller) Heparin Dosing Weight: 82.2 kg  Vital Signs: Temp: 97.8 F (36.6 C) (07/11 1119) Temp Source: Oral (07/11 1119) BP: 106/82 (07/11 1004) Pulse Rate: 187 (07/11 1004)  Labs: Recent Labs    03/15/19 03/15/19 1807 03/16/19 0633 03/16/19 0935  HGB  --  11.6* 10.9*  --   HCT  --  35.3* 34.7*  --   PLT  --  197 198  --   LABPROT  --  20.6*  --  21.7*  INR 2.9  1.9* 1.8*  --  1.9*  HEPARINUNFRC  --   --   --  0.63  CREATININE  --  5.34* 6.88*  --     Estimated Creatinine Clearance: 12.6 mL/min (A) (by C-G formula based on SCr of 6.88 mg/dL (H)).   Medical History: Past Medical History:  Diagnosis Date  . Angina   . ASCVD (arteriosclerotic cardiovascular disease)    , Anterior infarction 2005, LAD diagonal bifurcation intervention 03/2004  . Automatic implantable cardiac defibrillator -[employer]       . Benign neoplasm of colon   . CHF (congestive heart failure) ([place])   . Chronic systolic heart failure ([place])   . Coronary artery disease     Widely patent previously placed stents in the left anterior   . Crohn's disease ([place])   . 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 Dialysis patient ([place])   . Dyspnea   . Gastroparesis   . GERD (gastroesophageal reflux disease)   . High cholesterol   . Hyperlipidemia   . Hypersomnolent    Previous diagnosis of narcolepsy  . Hypertension, essential   . Ischemic cardiomyopathy    Ejection fraction 15-20% catheterization 2010  . Type II or unspecified type diabetes mellitus without mention of complication, not stated as uncontrolled   . Unspecified gastritis and gastroduodenitis without mention of hemorrhage     Medications:  Scheduled:  . amiodarone  200 mg Oral  Daily  . aspirin EC  81 mg Oral Daily  . atorvastatin  40 mg Oral q1800  . [START ON 03/18/2019] calcitRIOL  0.5 mcg Oral Q M,W,F  . Chlorhexidine Gluconate Cloth  6 each Topical Daily  . Chlorhexidine Gluconate Cloth  6 each Topical Daily  . citalopram  20 mg Oral Daily  . dronabinol  2.5 mg Oral BID AC  . erythromycin  250 mg Oral TID  . gabapentin  100 mg Oral BID  . insulin aspart  0-15 Units Subcutaneous TID WC  . insulin aspart  0-5 Units Subcutaneous QHS  . insulin aspart  7 Units Subcutaneous QPM  . insulin glargine  5 Units Subcutaneous Daily  . metoCLOPramide  10 mg Oral TID  . [START ON 03/18/2019] midodrine  5 mg Oral Q M,W,F  . multivitamin  1 tablet Oral Daily  . pantoprazole  40 mg Oral BID  . sevelamer carbonate  1,600 mg Oral BID WC  . sevelamer carbonate  4,000 mg Oral TID WC  . sodium chloride flush  10-40 mL Intracatheter Q12H   Infusions:  . heparin 1,150 Units/hr (03/16/19 1000)    Assessment: 59 yoM admitted for right foot pain, PMH significant for systolic CHF s/p HeartMate 3 LVAD placed in 06/2017, ESRD with HD MWF (  last HD session 7/10), CAD, h/o DVT, DM2, gastroparesis, and Aflutter on amiodarone.   At home, patient takes aspirin 81 mg daily and warfarin 7.5 mg on Tue/Fri and 5 mg on all other days, last dose taken on 7/9 PTA.   INR upon arrival is subtherapeutic at 1.8. Warfarin is being held for arteriogram. Pharmacy consulted to start heparin gtt when INR <2.  Initial heparin level therapeutic at 0.63. INR 1.9  H/H low, stable. Hgb 10.9, Plt 185.   Goal of Therapy:  Heparin level 0.3-0.7 units/ml Monitor platelets by anticoagulation protocol: Yes   Plan:  Continue heparin infusion at 1150 units/hr Check confirmatory level 8 hours after first level and daily while on heparin Continue to monitor H&H and platelets  Vertis Kelch, PharmD PGY2 Cardiology Pharmacy Resident Phone [phone] 03/16/2019       12:16 PM  Please check [email or web address]  for unit-specific pharmacist phone numbers

## 2019-03-16 NOTE — Progress Notes (Signed)
ANTICOAGULATION CONSULT NOTE - Follow Up Consult  Pharmacy Consult for heparin Indication: LVAD  Allergies  Allergen Reactions  . Metformin And Related Diarrhea    Patient Measurements: Weight: 182 lb 5.1 oz (82.7 kg)(vad, without controller) Heparin Dosing Weight: 82.2 kg  Vital Signs: Temp: 97.4 F (36.3 C) (07/11 1639) Temp Source: Oral (07/11 1639) BP: 97/72 (07/11 1800) Pulse Rate: 78 (07/11 1500)  Labs: Recent Labs    03/15/19 03/15/19 1807 03/16/19 0633 03/16/19 0935 03/16/19 1753  HGB  --  11.6* 10.9*  --   --   HCT  --  35.3* 34.7*  --   --   PLT  --  197 198  --   --   LABPROT  --  20.6*  --  21.7*  --   INR 2.9  1.9* 1.8*  --  1.9*  --   HEPARINUNFRC  --   --   --  0.63 0.69  CREATININE  --  5.34* 6.88*  --   --     Estimated Creatinine Clearance: 12.6 mL/min (A) (by C-G formula based on SCr of 6.88 mg/dL (H)).   Medical History: Past Medical History:  Diagnosis Date  . Angina   . ASCVD (arteriosclerotic cardiovascular disease)    , Anterior infarction 2005, LAD diagonal bifurcation intervention 03/2004  . Automatic implantable cardiac defibrillator -[employer]. Jude's       . Benign neoplasm of colon   . CHF (congestive heart failure) ([place])   . Chronic systolic heart failure ([place])   . Coronary artery disease     Widely patent previously placed stents in the left anterior   . Crohn's disease ([place])   . 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 Dialysis patient ([place])   . Dyspnea   . Gastroparesis   . GERD (gastroesophageal reflux disease)   . High cholesterol   . Hyperlipidemia   . Hypersomnolent    Previous diagnosis of narcolepsy  . Hypertension, essential   . Ischemic cardiomyopathy    Ejection fraction 15-20% catheterization 2010  . Type II or unspecified type diabetes mellitus without mention of complication, not stated as uncontrolled   . Unspecified gastritis and gastroduodenitis without mention of hemorrhage      Medications:  Scheduled:  . amiodarone  200 mg Oral Daily  . aspirin EC  81 mg Oral Daily  . atorvastatin  40 mg Oral q1800  . [START ON 03/18/2019] calcitRIOL  0.5 mcg Oral Q M,W,F  . Chlorhexidine Gluconate Cloth  6 each Topical Daily  . Chlorhexidine Gluconate Cloth  6 each Topical Daily  . citalopram  20 mg Oral Daily  . dronabinol  2.5 mg Oral BID AC  . erythromycin  250 mg Oral TID  . gabapentin  100 mg Oral BID  . insulin aspart  0-15 Units Subcutaneous TID WC  . insulin aspart  0-5 Units Subcutaneous QHS  . insulin aspart  7 Units Subcutaneous QPM  . insulin glargine  5 Units Subcutaneous Daily  . metoCLOPramide  10 mg Oral TID  . [START ON 03/18/2019] midodrine  5 mg Oral Q M,W,F  . multivitamin  1 tablet Oral Daily  . pantoprazole  40 mg Oral BID  . sevelamer carbonate  1,600 mg Oral BID WC  . sevelamer carbonate  4,000 mg Oral TID WC  . sodium chloride flush  10-40 mL Intracatheter Q12H   Infusions:  . heparin 1,150 Units/hr (03/16/19 1800)    Assessment: 13 yoM  admitted for right foot pain, PMH significant for systolic CHF s/p HeartMate 3 LVAD placed in 06/2017, ESRD with HD MWF (last HD session 7/10), CAD, h/o DVT, DM2, gastroparesis, and Aflutter on amiodarone.  -heparin level = 0.69  At home, patient takes aspirin 81 mg daily and warfarin 7.5 mg on Tue/Fri and 5 mg on all other days, last dose taken on 7/9 PTA.     Goal of Therapy:  Heparin level 0.3-0.7 units/ml Monitor platelets by anticoagulation protocol: Yes   Plan:  Continue heparin infusion at 1150 units/hr -Heparin daily wth CBC daily  Hildred Laser, PharmD Clinical Pharmacist **Pharmacist phone directory can now be found on [email or web address] ([email or web address]).  Listed under [place].

## 2019-03-17 LAB — PROTIME-INR
INR: 1.9 — ABNORMAL HIGH (ref 0.8–1.2)
Prothrombin Time: 21.1 seconds — ABNORMAL HIGH (ref 11.4–15.2)

## 2019-03-17 LAB — GLUCOSE, CAPILLARY
Glucose-Capillary: 78 mg/dL (ref 70–99)
Glucose-Capillary: 215 mg/dL — ABNORMAL HIGH (ref 70–99)
Glucose-Capillary: 88 mg/dL (ref 70–99)
Glucose-Capillary: 94 mg/dL (ref 70–99)

## 2019-03-17 LAB — BASIC METABOLIC PANEL
Anion gap: 16 — ABNORMAL HIGH (ref 5–15)
BUN: 51 mg/dL — ABNORMAL HIGH (ref 6–20)
CO2: 20 mmol/L — ABNORMAL LOW (ref 22–32)
Calcium: 9.4 mg/dL (ref 8.9–10.3)
Chloride: 98 mmol/L (ref 98–111)
Creatinine, Ser: 9.06 mg/dL — ABNORMAL HIGH (ref 0.61–1.24)
GFR calc Af Amer: 7 mL/min — ABNORMAL LOW (ref >60)
GFR calc non Af Amer: 6 mL/min — ABNORMAL LOW (ref >60)
Glucose, Bld: 87 mg/dL (ref 70–99)
Potassium: 4.9 mmol/L (ref 3.5–5.1)
Sodium: 134 mmol/L — ABNORMAL LOW (ref 135–145)

## 2019-03-17 LAB — HEPARIN LEVEL (UNFRACTIONATED): Heparin Unfractionated: 0.65 IU/mL (ref 0.30–0.70)

## 2019-03-17 LAB — CBC
HCT: 33.9 % — ABNORMAL LOW (ref 39.0–52.0)
Hemoglobin: 11 g/dL — ABNORMAL LOW (ref 13.0–17.0)
MCH: 30 pg (ref 26.0–34.0)
MCHC: 32.4 g/dL (ref 30.0–36.0)
MCV: 92.4 fL (ref 80.0–100.0)
Platelets: 198 10*3/uL (ref 150–400)
RBC: 3.67 MIL/uL — ABNORMAL LOW (ref 4.22–5.81)
RDW: 14.4 % (ref 11.5–15.5)
WBC: 15.3 10*3/uL — ABNORMAL HIGH (ref 4.0–10.5)
nRBC: 0 % (ref 0.0–0.2)

## 2019-03-17 LAB — LACTATE DEHYDROGENASE: LDH: 197 U/L — ABNORMAL HIGH (ref 98–192)

## 2019-03-17 MED ORDER — SODIUM CHLORIDE 0.9 % IV SOLN
62.5000 mg | INTRAVENOUS | Status: DC
  Administered 2019-03-20: 62.5 mg via INTRAVENOUS
  Filled 2019-03-17: qty 5

## 2019-03-17 MED ORDER — DOXERCALCIFEROL 4 MCG/2ML IV SOLN
3.0000 ug | INTRAVENOUS | Status: DC
  Administered 2019-03-18 – 2019-03-20 (×2): 3 ug via INTRAVENOUS
  Filled 2019-03-17 (×3): qty 2

## 2019-03-17 NOTE — Progress Notes (Signed)
ANTICOAGULATION CONSULT NOTE - Follow Up Consult  Pharmacy Consult for heparin Indication: LVAD  Allergies  Allergen Reactions  . Metformin And Related Diarrhea    Patient Measurements: Weight: 183 lb 3.2 oz (83.1 kg)(with vad controller) Heparin Dosing Weight: 82.2 kg  Vital Signs: Temp: 97.8 F (36.6 C) (07/12 0400) Temp Source: Oral (07/12 0400) BP: 102/77 (07/12 0800) Pulse Rate: 82 (07/12 0800)  Labs: Recent Labs    03/15/19 1807 03/16/19 0633 03/16/19 0935 03/16/19 1753 03/17/19 0443  HGB 11.6* 10.9*  --   --  11.0*  HCT 35.3* 34.7*  --   --  33.9*  PLT 197 198  --   --  198  LABPROT 20.6*  --  21.7*  --  21.1*  INR 1.8*  --  1.9*  --  1.9*  HEPARINUNFRC  --   --  0.63 0.69 0.65  CREATININE 5.34* 6.88*  --   --  9.06*    Estimated Creatinine Clearance: 9.6 mL/min (A) (by C-G formula based on SCr of 9.06 mg/dL (H)).   Medical History: Past Medical History:  Diagnosis Date  . Angina   . ASCVD (arteriosclerotic cardiovascular disease)    , Anterior infarction 2005, LAD diagonal bifurcation intervention 03/2004  . Automatic implantable cardiac defibrillator -[employer]. Jude's       . Benign neoplasm of colon   . CHF (congestive heart failure) ([place])   . Chronic systolic heart failure ([place])   . Coronary artery disease     Widely patent previously placed stents in the left anterior   . Crohn's disease ([place])   . 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 Dialysis patient ([place])   . Dyspnea   . Gastroparesis   . GERD (gastroesophageal reflux disease)   . High cholesterol   . Hyperlipidemia   . Hypersomnolent    Previous diagnosis of narcolepsy  . Hypertension, essential   . Ischemic cardiomyopathy    Ejection fraction 15-20% catheterization 2010  . Type II or unspecified type diabetes mellitus without mention of complication, not stated as uncontrolled   . Unspecified gastritis and gastroduodenitis without mention of hemorrhage      Medications:  Scheduled:  . amiodarone  200 mg Oral Daily  . aspirin EC  81 mg Oral Daily  . atorvastatin  40 mg Oral q1800  . [START ON 03/18/2019] calcitRIOL  0.5 mcg Oral Q M,W,F  . Chlorhexidine Gluconate Cloth  6 each Topical Daily  . Chlorhexidine Gluconate Cloth  6 each Topical Daily  . citalopram  20 mg Oral Daily  . dronabinol  2.5 mg Oral BID AC  . erythromycin  250 mg Oral TID  . gabapentin  100 mg Oral BID  . insulin aspart  0-15 Units Subcutaneous TID WC  . insulin aspart  0-5 Units Subcutaneous QHS  . insulin aspart  7 Units Subcutaneous QPM  . insulin glargine  5 Units Subcutaneous Daily  . metoCLOPramide  10 mg Oral TID  . [START ON 03/18/2019] midodrine  5 mg Oral Q M,W,F  . multivitamin  1 tablet Oral Daily  . pantoprazole  40 mg Oral BID  . sevelamer carbonate  1,600 mg Oral BID WC  . sevelamer carbonate  4,000 mg Oral TID WC  . sodium chloride flush  10-40 mL Intracatheter Q12H   Infusions:  . heparin 1,150 Units/hr (03/17/19 0800)    Assessment: 35 yoM admitted for right foot pain, PMH significant for systolic CHF s/p HeartMate 3  LVAD placed in 06/2017 on warfarin, ESRD with HD MWF (last HD session 7/10), CAD, h/o DVT, DM2, gastroparesis, and Aflutter on amiodarone. Warfarin is being held for arteriogram. Heparin initiated when INR < 2.  At home, patient takes aspirin 81 mg daily and warfarin 7.5 mg on Tue/Fri and 5 mg on all other days, last dose taken on 7/9 PTA.   Heparin level therapeutic at 0.65. INR 1.9. CBC stable. No overt bleeding noted.  Goal of Therapy:  Heparin level 0.3-0.7 units/ml Monitor platelets by anticoagulation protocol: Yes   Plan:  Continue heparin infusion at 1150 units/hr -Heparin level and CBC daily  Vertis Kelch, PharmD PGY2 Cardiology Pharmacy Resident Phone [phone] 03/17/2019       8:08 AM  Please check [email or web address] for unit-specific pharmacist phone numbers

## 2019-03-17 NOTE — Progress Notes (Signed)
Patient ID: [name], male   DOB: [birth year], 53 y.o.   MRN: [id number]   Advanced Heart Failure VAD Team Note  PCP-Cardiologist: No primary care provider on file.   Subjective:    No complaints this morning.  Toe pain/foot pain is controlled.  No abdominal pain/nausea.   Vascular US 7/10 difficult to interpret given LVAD.   Right foot plain film unremarkable.   LVAD INTERROGATION:  HeartMate 3 LVAD:   Flow 4.1 liters/min, speed 5500, power 4.9, PI 4.1.  Several PI events overnight.    Objective:    Vital Signs:   Temp:  [97.4 F (36.3 C)-98.3 F (36.8 C)] 98.3 F (36.8 C) (07/12 0800) Pulse Rate:  [37-187] 82 (07/12 0800) Resp:  [16] 16 (07/12 0800) BP: (86-118)/(70-102) 102/77 (07/12 0800) SpO2:  [81 %-100 %] 95 % (07/12 0800) Weight:  [83.1 kg] 83.1 kg (07/12 0510)   Mean arterial Pressure 86  Intake/Output:   Intake/Output Summary (Last 24 hours) at 03/17/2019 0829 Last data filed at 03/17/2019 0800 Gross per 24 hour  Intake 1236.23 ml  Output -  Net 1236.23 ml     Physical Exam    General: Well appearing this am. NAD.  HEENT: Normal. Neck: Supple, JVP 7-8 cm. Carotids OK.  Cardiac:  Mechanical heart sounds with LVAD hum present.  Lungs:  CTAB, normal effort.  Abdomen:  NT, ND, no HSM. No bruits or masses. +BS  LVAD exit site: Well-healed and incorporated. Dressing dry and intact. No erythema or drainage. Stabilization device present and accurately applied. Driveline dressing changed daily per sterile technique. Extremities:  Warm and dry. No cyanosis, clubbing, rash, or edema. Right 4th toe ulcer.  Neuro:  Alert & oriented x 3. Cranial nerves grossly intact. Moves all 4 extremities w/o difficulty. Affect pleasant    Telemetry   NSR 80s (personally reviewed)   Labs   Basic Metabolic Panel: Recent Labs  Lab 03/15/19 1807 03/16/19 0633 03/17/19 0443  NA 135 134* 134*  K 3.7 3.8 4.9  CL 97* 96* 98  CO2 24 23 20*  GLUCOSE 130* 152* 87   BUN 19 35* 51*  CREATININE 5.34* 6.88* 9.06*  CALCIUM 10.0 9.3 9.4    Liver Function Tests: Recent Labs  Lab 03/15/19 1807  AST 21  ALT 18  ALKPHOS 101  BILITOT 0.7  PROT 7.6  ALBUMIN 3.5   No results for input(s): LIPASE, AMYLASE in the last 168 hours. No results for input(s): AMMONIA in the last 168 hours.  CBC: Recent Labs  Lab 03/15/19 1807 03/16/19 0633 03/17/19 0443  WBC 12.7* 11.7* 15.3*  NEUTROABS 9.4*  --   --   HGB 11.6* 10.9* 11.0*  HCT 35.3* 34.7* 33.9*  MCV 91.7 94.6 92.4  PLT 197 198 198    INR: Recent Labs  Lab 03/15/19 03/15/19 1807 03/16/19 0935 03/17/19 0443  INR 2.9  1.9* 1.8* 1.9* 1.9*    Other results:  EKG:    Imaging   Dg Chest Port 1 View  Result Date: 03/15/2019 CLINICAL DATA:  Central line infiltration EXAM: PORTABLE CHEST 1 VIEW COMPARISON:  08/29/2018 FINDINGS: Cardiac shadow is stable. LVAD is again identified. Defibrillator is again seen. Right jugular central line is noted in the cavoatrial junction. Left dialysis catheter is seen. No pneumothorax is noted. Lungs are clear and stable. No bony abnormality is seen. IMPRESSION: Tubes and lines as described above.  No acute abnormality noted. Electronically Signed   By: Linus Mako.D.  On: 03/15/2019 21:05   Dg Foot Complete Right  Result Date: 03/15/2019 CLINICAL DATA:  Right foot pain EXAM: RIGHT FOOT COMPLETE - 3+ VIEW COMPARISON:  03/11/2019 FINDINGS: No acute fracture or dislocation is noted. Diffuse vascular calcifications are again seen. No soft tissue abnormality is noted. Mild osteopenia is again noted. IMPRESSION: Stable appearance of the right foot.  No acute abnormality noted. Electronically Signed   By: Inez Catalina M.D.   On: 03/15/2019 21:07   Vas [place] Lower Extremity Arterial Duplex  Result Date: 03/16/2019 LOWER EXTREMITY ARTERIAL DUPLEX STUDY Indications: Non-palpable pulses. High Risk Factors: Hypertension, hyperlipidemia, Diabetes, coronary artery                     disease. Other Factors: LVAD.  Current ABI: Not needed per ordering physician Performing Technologist: Maudry Mayhew, MHA, RDMS, RVT, RDCS  Examination Guidelines: A complete evaluation includes B-mode imaging, spectral Doppler, color Doppler, and power Doppler as needed of all accessible portions of each vessel. Bilateral testing is considered an integral part of a complete examination. Limited examinations for reoccurring indications may be performed as noted.  +-----------+-----------------------------------------+ RIGHT      Comments                                  +-----------+-----------------------------------------+ CFA Distal Patent                                    +-----------+-----------------------------------------+ DFA        Patent                                    +-----------+-----------------------------------------+ SFA Prox   Patent                                    +-----------+-----------------------------------------+ SFA Mid    Patent, moderate smooth homogenous plaque +-----------+-----------------------------------------+ SFA Distal Patent, mild smooth heterogenous plaque   +-----------+-----------------------------------------+ POP Distal Patent                                    +-----------+-----------------------------------------+ TP Trunk   Patent                                    +-----------+-----------------------------------------+ ATA Distal Patent                                    +-----------+-----------------------------------------+ PTA Distal Patent                                    +-----------+-----------------------------------------+ PERO DistalPatent                                    +-----------+-----------------------------------------+  +-----------+---------------------------------------+ LEFT       Comments                                +-----------+---------------------------------------+  CFA Distal  Patent                                  +-----------+---------------------------------------+ DFA        Patent                                  +-----------+---------------------------------------+ SFA Prox   Patent                                  +-----------+---------------------------------------+ SFA Mid    Patent                                  +-----------+---------------------------------------+ SFA Distal Patent, mild smooth heterogenous plaque +-----------+---------------------------------------+ POP Distal Patent                                  +-----------+---------------------------------------+ TP Trunk   Unable to visualize                     +-----------+---------------------------------------+ ATA Distal Patent                                  +-----------+---------------------------------------+ PTA Distal Patent                                  +-----------+---------------------------------------+ PERO DistalPatent                                  +-----------+---------------------------------------+  Summary: Bilateral: No obvious evidence of ischemia. All visualized arteries are patent. *Unable to estimate stenoses by velocity recording due to presence of LVAD.  See table(s) above for measurements and observations. Electronically signed by Ruta Hinds MD on 03/16/2019 at 10:27:10 AM.    Final    [place] Ekg Site Rite  Result Date: 03/15/2019 If Site Rite image not attached, placement could not be confirmed due to current cardiac rhythm.    Medications:     Scheduled Medications: . amiodarone  200 mg Oral Daily  . aspirin EC  81 mg Oral Daily  . atorvastatin  40 mg Oral q1800  . [START ON 03/18/2019] calcitRIOL  0.5 mcg Oral Q M,W,F  . Chlorhexidine Gluconate Cloth  6 each Topical Daily  . Chlorhexidine Gluconate Cloth  6 each Topical Daily  . citalopram  20 mg Oral Daily  . dronabinol  2.5 mg Oral BID AC  . erythromycin  250 mg Oral TID  .  gabapentin  100 mg Oral BID  . insulin aspart  0-15 Units Subcutaneous TID WC  . insulin aspart  0-5 Units Subcutaneous QHS  . insulin aspart  7 Units Subcutaneous QPM  . insulin glargine  5 Units Subcutaneous Daily  . metoCLOPramide  10 mg Oral TID  . [START ON 03/18/2019] midodrine  5 mg Oral Q M,W,F  . multivitamin  1 tablet Oral Daily  . pantoprazole  40 mg Oral BID  . sevelamer carbonate  1,600 mg Oral BID  WC  . sevelamer carbonate  4,000 mg Oral TID WC  . sodium chloride flush  10-40 mL Intracatheter Q12H    Infusions: . heparin 1,150 Units/hr (03/17/19 0800)    PRN Medications: acetaminophen, docusate sodium, fentaNYL (SUBLIMAZE) injection, lidocaine-prilocaine, LORazepam, ondansetron (ZOFRAN) IV, promethazine **OR** promethazine, sodium chloride flush, traMADol    Assessment/Plan:    1. Right foot pain: Ulcer on 4th toe.  I am concerned for ischemic foot.  Severe pain, now better with IV Fentanyl.  Feet are warm but unable to doppler pulse on right. He has known PAD, last dopplers in 2018 showed moderate PAD bilaterally.  He is afebrile, no definite evidence for infection.  Arterial dopplers difficult to interpret with LVAD, plain film of foot unremarkable.  Uric acid not elevated.  - Continue heparin gtt.  - Fentanyl IV for pain control (itches with morphine).  - VVS following, scheduled for arteriogram on Monday (will need to coordinate this with hemodialysis).  - Wound consult for foot ulcer.  2. Chronic systolic CHF: Ischemic cardiomyopathy. [employer] ICD. NSTEMI in March 2018 with DES to LAD and RCA, complicated by cardiogenic shock, low output requiring milrinone. Echo 8/18 with EF 20-25%, moderate MR. CPX (8/18) with severe functional impairment due to HF. He is s/p HM3 LVAD placement. He had pump thrombosis 6/19 due to [facility] outflow graft kinking. He had pump exchange for another HM3.  - Volume managed at dialysis, had last on Friday and due Monday.  - Continue ASA  81. Holding warfarin for arteriogram, now on heparin gtt with INR 1.9 today.  - [facility] evaluation for heart/kidney transplantand recently saw Dr Mosetta Pigeon. 3. ESRD: Developed peri-operatively after HM3 placement. 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  -He is currently looking for a living donor. - Will need to time HD tomorrow with LE arteriogram.  4. CAD: NSTEMI in 3/18. LHC with 99% ulcerated lesion proximal RCA with left to right collaterals, 95% mid LAD stenosis after mid LAD stent. s/p PCI to RCA and LAD on 11/25/16.  -Continue statin and ASA. 4. h/o DVTs: Factor V Leiden heterozygote. - Anticoagulated.  5. Diabetic gastroparesis: Difficult to manage. He has seen gastroparesis specialist at [facility]. Surprisingly, gastric emptying study was normal. However, electrogastrogram showed poor gastric accomodation/capacity. Therefore, small frequent meals recommended. Recently doing better.   - Continue reglan 5 mg tid/ac and Marinol.  - Continue Protonix bid.  - He will continue erythromycinindefinitely.  6. DM: Cover with SSI and home glargine.  7. HTN:MAP has been lower with dialysis, now off Bidil and getting midodrine prior to HD.  8. Atrial flutter: Paroxysmal. He has been in NSR recently.  - Continue amiodarone. 9. Left hand tingling and grip weakness: Present since he developed left arm hematoma from failed AV graft. 10. Difficult IV access: Placed IJ.   I reviewed the LVAD parameters from today, and compared the results to the patient's prior recorded data.  No programming changes were made.  The LVAD is functioning within specified parameters.  The patient performs LVAD self-test daily.  LVAD interrogation was negative for any significant power changes, alarms or PI events/speed drops.  LVAD equipment check completed and is in good working order.  Back-up equipment present.   LVAD  education done on emergency procedures and precautions and reviewed exit site care.  Length of Stay: 2  Loralie Champagne, MD 03/17/2019, 8:29 AM  VAD  Team --- VAD ISSUES ONLY--- Pager [phone] (7am - 7am)  Advanced Heart Failure Team  Pager [phone] (M-F; 7a - 4p)  Please contact [facility] for night-coverage after hours (4p -7a ) and weekends on [email or web address]

## 2019-03-17 NOTE — Consult Note (Addendum)
[facility] Renal Consultation Note  Indication for Consultation:  Management of ESRD/hemodialysis; anemia, hypertension/volume and secondary hyperparathyroidism  HPI: [name] is a 53 y.o. malewith ESRD,( op hd MWF  [place]  Compliant with last HD on schedule 7/10) CAD, severe ICM with LVAD , Type 2 DM with severe gastroparesis, HTN, GERD who was admitted now with Right foot Pain ischemic in nature  for VVS arteriogram tomorrow.He has a dry  Ulcer on Right 4th toe /  afebrile, with no definite evidence for infection.He had   Arterial dopplers  that are difficult to interpret with LVAD, plain film of foot unremarkable and Uric acid normal. He currently denied sob, cp chills , N/V or abdominal  Pain  .       Past Medical History:  Diagnosis Date  . Angina   . ASCVD (arteriosclerotic cardiovascular disease)    , Anterior infarction 2005, LAD diagonal bifurcation intervention 03/2004  . Automatic implantable cardiac defibrillator -St. Jude's       . Benign neoplasm of colon   . CHF (congestive heart failure) ([place])   . Chronic systolic heart failure ([place])   . Coronary artery disease     Widely patent previously placed stents in the left anterior   . Crohn's disease ([place])   . 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 Dialysis patient ([place])   . Dyspnea   . Gastroparesis   . GERD (gastroesophageal reflux disease)   . High cholesterol   . Hyperlipidemia   . Hypersomnolent    Previous diagnosis of narcolepsy  . Hypertension, essential   . Ischemic cardiomyopathy    Ejection fraction 15-20% catheterization 2010  . Type II or unspecified type diabetes mellitus without mention of complication, not stated as uncontrolled   . Unspecified gastritis and gastroduodenitis without mention of hemorrhage     Past Surgical History:  Procedure Laterality Date  . A/V SHUNTOGRAM N/A 05/24/2018   Procedure: A/V SHUNTOGRAM;  Surgeon: Marty Heck, MD;   Location: [facility];  Service: Cardiovascular;  Laterality: N/A;  . APPENDECTOMY    . AV FISTULA PLACEMENT Right 06/26/2017   Procedure: ARTERIOVENOUS (AV) FISTULA CREATION VERSUS GRAFT RIGHT  ARM;  Surgeon: Conrad Leesburg, MD;  Location: [facility];  Service: Vascular;  Laterality: Right;  . AV FISTULA PLACEMENT Right 10/31/2017   Procedure: INSERTION OF ARTERIOVENOUS (AV) ARTEGRAFT,  RIGHT UPPER ARM;  Surgeon: Conrad Frankford, MD;  Location: [facility];  Service: Vascular;  Laterality: Right;  . AV FISTULA PLACEMENT Left 05/29/2018   Procedure: ARTERIOVENOUS (AV) FISTULA CREATION  LEFT UPPER EXTREMITY;  Surgeon: Waynetta Sandy, MD;  Location: [facility];  Service: Vascular;  Laterality: Left;  . AV FISTULA PLACEMENT Left 08/09/2018   Procedure: INSERTION OF ARTERIOVENOUS (AV) GORE-TEX GRAFT LEFT UPPER ARM;  Surgeon: Waynetta Sandy, MD;  Location: [facility];  Service: Vascular;  Laterality: Left;  . CARDIAC DEFIBRILLATOR PLACEMENT  2010   St. Jude ICD  . CENTRAL LINE INSERTION Right 05/24/2018   Procedure: CENTRAL LINE INSERTION;  Surgeon: Marty Heck, MD;  Location: [facility];  Service: Cardiovascular;  Laterality: Right;  . COLECTOMY  ~ 2003   "for Crohn's" ascending colon.    . CORONARY STENT INTERVENTION N/A 11/25/2016   Procedure: Coronary Stent Intervention;  Surgeon: Leonie Man, MD;  Location: [facility];  Service: Cardiovascular;  Laterality: N/A;  . EP IMPLANTABLE DEVICE N/A 09/14/2016  Procedure: ICD Generator Changeout;  Surgeon: Deboraha Sprang, MD;  Location: [facility];  Service: Cardiovascular;  Laterality: N/A;  . ESOPHAGOGASTRODUODENOSCOPY N/A 07/12/2017   Procedure: ESOPHAGOGASTRODUODENOSCOPY (EGD);  Surgeon: Jerene Bears, MD;  Location: [facility];  Service: Gastroenterology;  Laterality: N/A;  VAD pt so VAD team will need to accompany pt.   [name] FETAL SURGERY FOR CONGENITAL HERNIA     ????? pt has no knowledge of this..    .  FISTULOGRAM Left 08/09/2018   Procedure: FISTULOGRAM LEFT ARM;  Surgeon: Waynetta Sandy, MD;  Location: [place] [facility];  Service: Vascular;  Laterality: Left;  . INGUINAL HERNIA REPAIR    . INSERTION OF DIALYSIS CATHETER Right 06/26/2017   Procedure: INSERTION OF TUNNELED  DIALYSIS CATHETER RIGHT INTERNAL JUGULAR;  Surgeon: Conrad Junction, MD;  Location: [facility];  Service: Vascular;  Laterality: Right;  . INSERTION OF IMPLANTABLE LEFT VENTRICULAR ASSIST DEVICE N/A 06/12/2017   Procedure: INSERTION OF IMPLANTABLE LEFT VENTRICULAR ASSIST [place] 3;  Surgeon: Ivin Poot, MD;  Location: [facility];  Service: Open Heart Surgery;  Laterality: N/A;  HM3 LVAD  CIRC ARREST  NITRIC OXIDE  . INSERTION OF IMPLANTABLE LEFT VENTRICULAR ASSIST DEVICE N/A 02/28/2018   Procedure: REDO INSERTION OF IMPLANTABLE LEFT VENTRICULAR ASSIST DEVICE - HEARTMATE 3;  Surgeon: Ivin Poot, MD;  Location: [facility];  Service: Open Heart Surgery;  Laterality: N/A;  . IR REMOVAL TUN CV CATH W/O FL  02/26/2018  . IR THORACENTESIS ASP PLEURAL SPACE W/IMG GUIDE  06/28/2017  . LIGATION OF ARTERIOVENOUS  FISTULA Right 10/31/2017   Procedure: LIGATION OF BRACHIO-BASILIC VEIN TRANSPOSITION RIGHT ARM;  Surgeon: Conrad Peshtigo, MD;  Location: [facility];  Service: Vascular;  Laterality: Right;  . MULTIPLE EXTRACTIONS WITH ALVEOLOPLASTY N/A 06/08/2017   Procedure: MULTIPLE EXTRACTION WITH ALVEOLOPLASTY AND PRE PROSTHETIC SURGERY AS NEEDED;  Surgeon: Lenn Cal, DDS;  Location: [facility];  Service: Oral Surgery;  Laterality: N/A;  . RIGHT HEART CATH N/A 06/07/2017   Procedure: RIGHT HEART CATH;  Surgeon: Larey Dresser, MD;  Location: [facility];  Service: Cardiovascular;  Laterality: N/A;  . RIGHT HEART CATH N/A 02/08/2018   Procedure: RIGHT HEART CATH;  Surgeon: Larey Dresser, MD;  Location: [facility];  Service: Cardiovascular;  Laterality: N/A;  . RIGHT HEART CATH N/A 08/09/2018   Procedure: RIGHT HEART CATH;   Surgeon: Larey Dresser, MD;  Location: [facility];  Service: Cardiovascular;  Laterality: N/A;  . RIGHT/LEFT HEART CATH AND CORONARY ANGIOGRAPHY N/A 11/23/2016   Procedure: Right/Left Heart Cath and Coronary Angiography;  Surgeon: Larey Dresser, MD;  Location: [facility];  Service: Cardiovascular;  Laterality: N/A;  . TEE WITHOUT CARDIOVERSION N/A 06/12/2017   Procedure: TRANSESOPHAGEAL ECHOCARDIOGRAM (TEE);  Surgeon: Prescott Gum, Collier Salina, MD;  Location: [facility];  Service: Open Heart Surgery;  Laterality: N/A;  . TEE WITHOUT CARDIOVERSION N/A 02/28/2018   Procedure: TRANSESOPHAGEAL ECHOCARDIOGRAM (TEE);  Surgeon: Prescott Gum, Collier Salina, MD;  Location: [place] [facility];  Service: Open Heart Surgery;  Laterality: N/A;  . TEMPORARY DIALYSIS CATHETER Left 05/24/2018   Procedure: TEMPORARY DIALYSIS CATHETER;  Surgeon: Marty Heck, MD;  Location: [facility];  Service: Cardiovascular;  Laterality: Left;  . THROMBECTOMY AND REVISION OF ARTERIOVENTOUS (AV) GORETEX  GRAFT Right 12/12/2017   Procedure: THROMBECTOMY ARTERIOVENTOUS (AV) GORETEX  GRAFT RIGHT UPPER ARM;  Surgeon: Conrad McCleary, MD;  Location: [facility];  Service: Vascular;  Laterality: Right;      Family History  Problem Relation Age of Onset  . Colon polyps Mother   . Diabetes Mother   . Heart attack Father   . Diabetes Father   . Stroke Paternal Grandfather   . Colitis Sister   . Heart disease Brother   . Colitis Sister   . Colon cancer Neg Hx       reports that he has never smoked. He has never used smokeless tobacco. He reports that he does not drink alcohol or use drugs.   Allergies  Allergen Reactions  . Metformin And Related Diarrhea    Prior to Admission medications   Medication Sig Start Date End Date Taking? Authorizing Provider  amiodarone (PACERONE) 200 MG tablet Take 200 mg by mouth daily.   Yes [provider]  aspirin EC 81 MG tablet Take 1 tablet (81 mg total) by mouth daily. 08/20/18  Yes  Eileen Stanford, PA-C  atorvastatin (LIPITOR) 40 MG tablet Take 1 tablet (40 mg total) by mouth daily at 6 PM. 01/25/18  Yes [name], MD  calcitRIOL (ROCALTROL) 0.5 MCG capsule Take 1 capsule (0.5 mcg total) by mouth every Monday, Wednesday, and Friday. 06/06/18  Yes Georgiana Shore, NP  citalopram (CELEXA) 20 MG tablet Take 1 tablet (20 mg total) by mouth daily. 03/19/18  Yes Georgiana Shore, NP  dronabinol (MARINOL) 2.5 MG capsule Take 1 capsule (2.5 mg total) by mouth 2 (two) times daily before lunch and supper. 03/27/18  Yes Larey Dresser, MD  erythromycin (E-MYCIN) 250 MG tablet Take 1 tablet (250 mg total) by mouth 3 (three) times daily. 03/25/18  Yes Isaiah Serge, NP  gabapentin (NEURONTIN) 100 MG capsule Take 1 capsule (100 mg total) by mouth 2 (two) times daily. 03/12/19  Yes Larey Dresser, MD  insulin glargine (LANTUS) 100 UNIT/ML injection Inject 0.1 mLs (10 Units total) into the skin daily. Patient taking differently: Inject 5 Units into the skin daily.  05/09/18  Yes Lovenia Kim, MD  LORazepam (ATIVAN) 0.5 MG tablet Take 1 tablet (0.5 mg total) by mouth every 12 (twelve) hours as needed (nausea). 03/27/18  Yes Larey Dresser, MD  metoCLOPramide (REGLAN) 10 MG tablet Take 1 tablet (10 mg total) by mouth 3 (three) times daily. 01/22/18  Yes Georgiana Shore, NP  midodrine (PROAMATINE) 5 MG tablet Take 1 tablet (5 mg total) by mouth daily. Take 1 tablet before dialysis treatment 02/07/19  Yes Larey Dresser, MD  multivitamin (RENA-VIT) TABS tablet Take 1 tablet by mouth daily.   Yes [provider]  NOVOLOG 100 UNIT/ML injection INJECT 7 UNITS INTO THE SKIN DAILY WITH SUPPER. Patient taking differently: Inject 7 Units into the skin daily with supper.  03/15/19  Yes Anderson, Chelsey L, DO  pantoprazole (PROTONIX) 40 MG tablet Take 40 mg by mouth 2 (two) times daily.    Yes [provider]  promethazine (PHENERGAN) 12.5 MG tablet Take 1 tablet (12.5 mg  total) by mouth every 6 (six) hours as needed for nausea or vomiting. 10/23/18  Yes Larey Dresser, MD  sevelamer carbonate (RENVELA) 800 MG tablet Take 1,600-4,000 mg by mouth See admin instructions. 4072m three times daily with meals and 16045mtwice daily with snacks   Yes [provider]  traMADol (ULTRAM) 50 MG tablet Take 1 tablet (50 mg total) by mouth every 6 (six) hours as needed for moderate pain. 08/18/18  Yes ThEileen StanfordPA-C  warfarin (COUMADIN) 5 MG tablet TAKE AS FOLLOWS: MON-WED-FRI 1&1/2 TABLETS DAILY. ON ALL OTHER DAYS 1 TABLET Patient taking differently: TAKE AS FOLLOWS: Tue and FRI 1&1/2 TABLETS DAILY. ON ALL OTHER DAYS 1 TABLET 09/12/18  Yes Larey Dresser, MD     Anti-infectives (From admission, onward)   Start     Dose/Rate Route Frequency Ordered Stop   03/15/19 2200  erythromycin (E-MYCIN) tablet 250 mg     250 mg Oral 3 times daily 03/15/19 1706        Results for orders placed or performed during the hospital encounter of 03/15/19 (from the past 48 hour(s))  SARS Coronavirus 2 (CEPHEID - Performed in [facility] hospital lab), Hosp Order     Status: None   Collection Time: 03/15/19  4:23 PM   Specimen: Nasopharyngeal Swab  Result Value Ref Range   SARS Coronavirus 2 NEGATIVE NEGATIVE    Comment: (NOTE) If result is NEGATIVE SARS-CoV-2 target nucleic acids are NOT DETECTED. The SARS-CoV-2 RNA is generally detectable in upper and lower  respiratory specimens during the acute phase of infection. The lowest  concentration of SARS-CoV-2 viral copies this assay can detect is 250  copies / mL. A negative result does not preclude SARS-CoV-2 infection  and should not be used as the sole basis for treatment or other  patient management decisions.  A negative result may occur with  improper specimen collection / handling, submission of specimen other  than nasopharyngeal swab, presence of viral mutation(s) within the  areas targeted by this assay,  and inadequate number of viral copies  (<250 copies / mL). A negative result must be combined with clinical  observations, patient history, and epidemiological information. If result is POSITIVE SARS-CoV-2 target nucleic acids are DETECTED. The SARS-CoV-2 RNA is generally detectable in upper and lower  respiratory specimens dur ing the acute phase of infection.  Positive  results are indicative of active infection with SARS-CoV-2.  Clinical  correlation with patient history and other diagnostic information is  necessary to determine patient infection status.  Positive results do  not rule out bacterial infection or co-infection with other viruses. If result is PRESUMPTIVE POSTIVE SARS-CoV-2 nucleic acids MAY BE PRESENT.   A presumptive positive result was obtained on the submitted specimen  and confirmed on repeat testing.  While 2019 novel coronavirus  (SARS-CoV-2) nucleic acids may be present in the submitted sample  additional confirmatory testing may be necessary for epidemiological  and / or clinical management purposes  to differentiate between  SARS-CoV-2 and other Sarbecovirus currently known to infect humans.  If clinically indicated additional testing with an alternate test  methodology [phone]) is advised. The SARS-CoV-2 RNA is generally  detectable in upper and lower respiratory sp ecimens during the acute  phase of infection. The expected result is Negative. Fact Sheet for Patients:  StrictlyIdeas.no Fact Sheet for Healthcare Providers: BankingDealers.co.za This test is not yet approved or cleared by the [place] FDA and has been authorized for detection and/or diagnosis of SARS-CoV-2 by FDA under an Emergency Use Authorization (EUA).  This EUA will remain in effect (meaning this test can be used) for the duration of the COVID-19 declaration under Section 564(b)(1) of the Act, 21 U.S.C. section 360bbb-3(b)(1), unless  the authorization is terminated or revoked sooner. Performed at [facility], [place] [address], [place], [place] [address]   CBC with Differential/Platelet     Status: Abnormal   Collection Time: 03/15/19  6:07 PM  Result  Value Ref Range   WBC 12.7 (H) 4.0 - 10.5 K/uL   RBC 3.85 (L) 4.22 - 5.81 MIL/uL   Hemoglobin 11.6 (L) 13.0 - 17.0 g/dL   HCT 35.3 (L) 39.0 - 52.0 %   MCV 91.7 80.0 - 100.0 fL   MCH 30.1 26.0 - 34.0 pg   MCHC 32.9 30.0 - 36.0 g/dL   RDW 14.7 11.5 - 15.5 %   Platelets 197 150 - 400 K/uL   nRBC 0.0 0.0 - 0.2 %   Neutrophils Relative % 74 %   Neutro Abs 9.4 (H) 1.7 - 7.7 K/uL   Lymphocytes Relative 14 %   Lymphs Abs 1.7 0.7 - 4.0 K/uL   Monocytes Relative 6 %   Monocytes Absolute 0.7 0.1 - 1.0 K/uL   Eosinophils Relative 5 %   Eosinophils Absolute 0.6 (H) 0.0 - 0.5 K/uL   Basophils Relative 0 %   Basophils Absolute 0.0 0.0 - 0.1 K/uL   Immature Granulocytes 1 %   Abs Immature Granulocytes 0.15 (H) 0.00 - 0.07 K/uL    Comment: Performed at [facility], [address] [place], [place] [address]  Comprehensive metabolic panel     Status: Abnormal   Collection Time: 03/15/19  6:07 PM  Result Value Ref Range   Sodium 135 135 - 145 mmol/L   Potassium 3.7 3.5 - 5.1 mmol/L   Chloride 97 (L) 98 - 111 mmol/L   CO2 24 22 - 32 mmol/L   Glucose, Bld 130 (H) 70 - 99 mg/dL   BUN 19 6 - 20 mg/dL   Creatinine, Ser 5.34 (H) 0.61 - 1.24 mg/dL   Calcium 10.0 8.9 - 10.3 mg/dL   Total Protein 7.6 6.5 - 8.1 g/dL   Albumin 3.5 3.5 - 5.0 g/dL   AST 21 15 - 41 U/L   ALT 18 0 - 44 U/L   Alkaline Phosphatase 101 38 - 126 U/L   Total Bilirubin 0.7 0.3 - 1.2 mg/dL   GFR calc non Af Amer 11 (L) >60 mL/min   GFR calc Af Amer 13 (L) >60 mL/min   Anion gap 14 5 - 15    Comment: Performed at [facility] [address] [place], [place] [address]  Lactate dehydrogenase     Status: Abnormal   Collection Time: 03/15/19  6:07 PM  Result Value Ref Range   LDH 266 (H)  98 - 192 U/L    Comment: Performed at [facility], [place] [address], [place], [place] [address]  Protime-INR     Status: Abnormal   Collection Time: 03/15/19  6:07 PM  Result Value Ref Range   Prothrombin Time 20.6 (H) 11.4 - 15.2 seconds   INR 1.8 (H) 0.8 - 1.2    Comment: (NOTE) INR goal varies based on device and disease states. Performed at [facility], [place] [address], [place], [place] [address]   Glucose, capillary     Status: Abnormal   Collection Time: 03/15/19  9:01 PM  Result Value Ref Range   Glucose-Capillary 182 (H) 70 - 99 mg/dL  MRSA PCR Screening     Status: None   Collection Time: 03/15/19  9:29 PM   Specimen: Nasal Mucosa; Nasopharyngeal  Result Value Ref Range   MRSA by PCR NEGATIVE NEGATIVE    Comment:        The GeneXpert MRSA Assay (FDA approved for NASAL specimens only), is one component of a comprehensive MRSA colonization  surveillance program. It is not intended to diagnose MRSA infection nor to guide or monitor treatment for MRSA infections. Performed at [facility], [place] [address] [place], [place] [address]   Glucose, capillary     Status: Abnormal   Collection Time: 03/16/19  6:13 AM  Result Value Ref Range   Glucose-Capillary 154 (H) 70 - 99 mg/dL  Lactate dehydrogenase     Status: None   Collection Time: 03/16/19  6:33 AM  Result Value Ref Range   LDH 185 98 - 192 U/L    Comment: Performed at [facility], [place] [address], [place], [place] [address]  CBC     Status: Abnormal   Collection Time: 03/16/19  6:33 AM  Result Value Ref Range   WBC 11.7 (H) 4.0 - 10.5 K/uL   RBC 3.67 (L) 4.22 - 5.81 MIL/uL   Hemoglobin 10.9 (L) 13.0 - 17.0 g/dL   HCT 34.7 (L) 39.0 - 52.0 %   MCV 94.6 80.0 - 100.0 fL   MCH 29.7 26.0 - 34.0 pg   MCHC 31.4 30.0 - 36.0 g/dL   RDW 14.6 11.5 - 15.5 %   Platelets 198 150 - 400 K/uL   nRBC 0.0 0.0 - 0.2 %    Comment: Performed at [facility], [place] [address], [place], [place] [address]   Basic metabolic panel     Status: Abnormal   Collection Time: 03/16/19  6:33 AM  Result Value Ref Range   Sodium 134 (L) 135 - 145 mmol/L   Potassium 3.8 3.5 - 5.1 mmol/L   Chloride 96 (L) 98 - 111 mmol/L   CO2 23 22 - 32 mmol/L   Glucose, Bld 152 (H) 70 - 99 mg/dL   BUN 35 (H) 6 - 20 mg/dL   Creatinine, Ser 6.88 (H) 0.61 - 1.24 mg/dL   Calcium 9.3 8.9 - 10.3 mg/dL   GFR calc non Af Amer 8 (L) >60 mL/min   GFR calc Af Amer 10 (L) >60 mL/min   Anion gap 15 5 - 15    Comment: Performed at [facility] [address], [place], [place] [address]  Uric acid     Status: None   Collection Time: 03/16/19  6:33 AM  Result Value Ref Range   Uric Acid, Serum 5.0 3.7 - 8.6 mg/dL    Comment: Performed at [facility] [facility] [address], [place], [place] [address]  Heparin level (unfractionated)     Status: None   Collection Time: 03/16/19  9:35 AM  Result Value Ref Range   Heparin Unfractionated 0.63 0.30 - 0.70 IU/mL    Comment: (NOTE) If heparin results are below expected values, and patient dosage has  been confirmed, suggest follow up testing of antithrombin III levels. Performed at [facility], [place] [address] [place], [place] [address]   Protime-INR     Status: Abnormal   Collection Time: 03/16/19  9:35 AM  Result Value Ref Range   Prothrombin Time 21.7 (H) 11.4 - 15.2 seconds   INR 1.9 (H) 0.8 - 1.2    Comment: (NOTE) INR goal varies based on device and disease states. Performed at [facility], [place] [address], [place], [place] [address]   Glucose, capillary     Status: Abnormal   Collection Time: 03/16/19 11:21 AM  Result Value Ref Range   Glucose-Capillary 185 (H) 70 - 99 mg/dL  Glucose, capillary  Status: Abnormal   Collection Time: 03/16/19  4:41 PM  Result Value Ref Range   Glucose-Capillary 111 (H) 70 - 99 mg/dL  Heparin level (unfractionated)     Status: None   Collection Time: 03/16/19  5:53 PM  Result Value Ref Range   Heparin  Unfractionated 0.69 0.30 - 0.70 IU/mL    Comment: (NOTE) If heparin results are below expected values, and patient dosage has  been confirmed, suggest follow up testing of antithrombin III levels. Performed at [facility], [place] [address] [place], [place] [address]   Glucose, capillary     Status: Abnormal   Collection Time: 03/16/19  9:23 PM  Result Value Ref Range   Glucose-Capillary 114 (H) 70 - 99 mg/dL  Lactate dehydrogenase     Status: Abnormal   Collection Time: 03/17/19  4:43 AM  Result Value Ref Range   LDH 197 (H) 98 - 192 U/L    Comment: Performed at [facility], [place] [address] [place], [place] [address]  Protime-INR     Status: Abnormal   Collection Time: 03/17/19  4:43 AM  Result Value Ref Range   Prothrombin Time 21.1 (H) 11.4 - 15.2 seconds   INR 1.9 (H) 0.8 - 1.2    Comment: (NOTE) INR goal varies based on device and disease states. Performed at [facility], [place] [address], [place], [place] [address]   CBC     Status: Abnormal   Collection Time: 03/17/19  4:43 AM  Result Value Ref Range   WBC 15.3 (H) 4.0 - 10.5 K/uL   RBC 3.67 (L) 4.22 - 5.81 MIL/uL   Hemoglobin 11.0 (L) 13.0 - 17.0 g/dL   HCT 33.9 (L) 39.0 - 52.0 %   MCV 92.4 80.0 - 100.0 fL   MCH 30.0 26.0 - 34.0 pg   MCHC 32.4 30.0 - 36.0 g/dL   RDW 14.4 11.5 - 15.5 %   Platelets 198 150 - 400 K/uL   nRBC 0.0 0.0 - 0.2 %    Comment: Performed at [facility], [place] [address] [place], [place] [address]  Basic metabolic panel     Status: Abnormal   Collection Time: 03/17/19  4:43 AM  Result Value Ref Range   Sodium 134 (L) 135 - 145 mmol/L   Potassium 4.9 3.5 - 5.1 mmol/L    Comment: DELTA CHECK NOTED   Chloride 98 98 - 111 mmol/L   CO2 20 (L) 22 - 32 mmol/L   Glucose, Bld 87 70 - 99 mg/dL   BUN 51 (H) 6 - 20 mg/dL   Creatinine, Ser 9.06 (H) 0.61 - 1.24 mg/dL   Calcium 9.4 8.9 - 10.3 mg/dL   GFR calc non Af Amer 6 (L) >60 mL/min   GFR calc Af Amer 7 (L) >60 mL/min    Anion gap 16 (H) 5 - 15    Comment: Performed at [facility], [place] [address], [place], [place] [address]  Heparin level (unfractionated)     Status: None   Collection Time: 03/17/19  4:43 AM  Result Value Ref Range   Heparin Unfractionated 0.65 0.30 - 0.70 IU/mL    Comment: (NOTE) If heparin results are below expected values, and patient dosage has  been confirmed, suggest follow up testing of antithrombin III levels. Performed at [facility], [place] [address], [place], [place] [address]   Glucose, capillary     Status: None   Collection Time:  03/17/19  7:07 AM  Result Value Ref Range   Glucose-Capillary 78 70 - 99 mg/dL   *Note: Due to a large number of results and/or encounters for the requested time period, some results have not been displayed. A complete set of results can be found in Results Review.  .  ROS: see hpi   Physical Exam: Vitals:   03/17/19 0900 03/17/19 1105  BP: 108/78 104/84  Pulse:    Resp:    Temp:    SpO2:       General: alert ,nad Wd, WN Male pleasant  ox3 HEENT: [place] , MMM, Not icteric   Neck: supple, no jvd , no bruits Heart: RRR with Mechanical hrt sounds with LVAD  Hum   Lungs: CTA ,nonlabored breathing  Abdomen: BS pos , soft , NT, ND  Extremities: No pedal edema / Right 4th toe ulcer between digits with  toe tip dusky / no left foot ulcers  Skin: warm dry R ft as above  Neuro: alert OX3, moves all extrem. No acute defects appreciated  Dialysis Access: R IJ Perm cath dressing dry /clear   Dialysis Orders: Center: [facility]  on MWF . EDW 84 kg   HD Bath 2 K, 3 CA  Time 4hr 64mn  Heparin NONE. Access R IJ Perm cath      Hec 3 mcg IV/HD  Venofer 50 mg q wkly hd  NO ESA  Last hgb 10.8 ( was last on Mircera  60 mcg 6/03 )     Assessment/Plan 1. Right foot Pain cw Ischemia  dz = VVS seeing plans for Arteriogram in am / pain meds per admit  2. ESRD -  HD MWF ,  HD  After Arteriogram/ noted has failed recent Left arm  AVGG ( perm cath  Access   Only ) 3. Chronic Systolic CHF / ICM  With ho CAD  ICD/ SP HM3 LVAD  - Card  Seeing   4. Hypertension/volume  - current volume ok / bp baseline lowish  5. Anemia  -hgb 11.6  Only on weekly Iron on hd , no esa  6. Metabolic bone disease -  Hec on hd , Renvela as binder / can dc po calcitriol  7. Atrial Flutter, Paroxysmal- NSR  Now  amiodarone , Card following    DErnest Haber PA-C [id number]/08/2019, 11:58 AM   Pt seen, examined and agree w A/P as above.  RKelly Splinter MD 03/17/2019, 5:46 PM

## 2019-03-17 NOTE — Plan of Care (Signed)

## 2019-03-17 NOTE — Progress Notes (Signed)
Spoke with pt regarding details of agram This will probably be around noon or early afternoon tomorrow  Ruta Hinds, MD [facility]: [phone] Pager: [phone]

## 2019-03-18 ENCOUNTER — Encounter (HOSPITAL_COMMUNITY)
Admission: AD | Disposition: A | Discharge status: Home / Self Care | Payer: Self-pay | Source: Ambulatory Visit | Attending: Cardiology

## 2019-03-18 HISTORY — PX: PERIPHERAL VASCULAR INTERVENTION: CATH118257

## 2019-03-18 HISTORY — PX: ABDOMINAL AORTOGRAM W/LOWER EXTREMITY: CATH118223

## 2019-03-18 HISTORY — PX: PERIPHERAL VASCULAR BALLOON ANGIOPLASTY: CATH118281

## 2019-03-18 LAB — GLUCOSE, CAPILLARY
Glucose-Capillary: 86 mg/dL (ref 70–99)
Glucose-Capillary: 82 mg/dL (ref 70–99)
Glucose-Capillary: 150 mg/dL — ABNORMAL HIGH (ref 70–99)
Glucose-Capillary: 139 mg/dL — ABNORMAL HIGH (ref 70–99)

## 2019-03-18 LAB — BASIC METABOLIC PANEL
Anion gap: 16 — ABNORMAL HIGH (ref 5–15)
BUN: 67 mg/dL — ABNORMAL HIGH (ref 6–20)
CO2: 20 mmol/L — ABNORMAL LOW (ref 22–32)
Calcium: 9.1 mg/dL (ref 8.9–10.3)
Chloride: 96 mmol/L — ABNORMAL LOW (ref 98–111)
Creatinine, Ser: 10.85 mg/dL — ABNORMAL HIGH (ref 0.61–1.24)
GFR calc Af Amer: 6 mL/min — ABNORMAL LOW (ref >60)
GFR calc non Af Amer: 5 mL/min — ABNORMAL LOW (ref >60)
Glucose, Bld: 94 mg/dL (ref 70–99)
Potassium: 4.8 mmol/L (ref 3.5–5.1)
Sodium: 132 mmol/L — ABNORMAL LOW (ref 135–145)

## 2019-03-18 LAB — CBC
HCT: 30.1 % — ABNORMAL LOW (ref 39.0–52.0)
Hemoglobin: 9.6 g/dL — ABNORMAL LOW (ref 13.0–17.0)
MCH: 29.7 pg (ref 26.0–34.0)
MCHC: 31.9 g/dL (ref 30.0–36.0)
MCV: 93.2 fL (ref 80.0–100.0)
Platelets: 203 10*3/uL (ref 150–400)
RBC: 3.23 MIL/uL — ABNORMAL LOW (ref 4.22–5.81)
RDW: 14.4 % (ref 11.5–15.5)
WBC: 14.5 10*3/uL — ABNORMAL HIGH (ref 4.0–10.5)
nRBC: 0 % (ref 0.0–0.2)

## 2019-03-18 LAB — PROTIME-INR
INR: 1.7 — ABNORMAL HIGH (ref 0.8–1.2)
Prothrombin Time: 19.5 seconds — ABNORMAL HIGH (ref 11.4–15.2)

## 2019-03-18 LAB — LACTATE DEHYDROGENASE: LDH: 165 U/L (ref 98–192)

## 2019-03-18 LAB — HEPARIN LEVEL (UNFRACTIONATED)
Heparin Unfractionated: 0.55 IU/mL (ref 0.30–0.70)
Heparin Unfractionated: 0.34 IU/mL (ref 0.30–0.70)

## 2019-03-18 SURGERY — ABDOMINAL AORTOGRAM W/LOWER EXTREMITY
Anesthesia: LOCAL

## 2019-03-18 MED ORDER — HEPARIN SODIUM (PORCINE) 1000 UNIT/ML IJ SOLN
INTRAMUSCULAR | Status: DC | PRN
  Administered 2019-03-18: 4000 [IU] via INTRAVENOUS
  Administered 2019-03-18: 6000 [IU] via INTRAVENOUS
  Administered 2019-03-18: 15:00:00 3400 [IU]

## 2019-03-18 MED ORDER — SODIUM CHLORIDE 0.9% FLUSH
3.0000 mL | Freq: Two times a day (BID) | INTRAVENOUS | Status: DC
  Administered 2019-03-18 – 2019-03-22 (×6): 3 mL via INTRAVENOUS

## 2019-03-18 MED ORDER — LABETALOL HCL 5 MG/ML IV SOLN: 10.0000 mg | INTRAVENOUS | Status: DC | PRN

## 2019-03-18 MED ORDER — MIDAZOLAM HCL 2 MG/2ML IJ SOLN
INTRAMUSCULAR | Status: DC | PRN
  Administered 2019-03-18 (×2): 0.5 mg via INTRAVENOUS

## 2019-03-18 MED ORDER — ACETAMINOPHEN 325 MG PO TABS: 650.0000 mg | ORAL_TABLET | ORAL | Status: DC | PRN

## 2019-03-18 MED ORDER — MIDODRINE HCL 5 MG PO TABS
ORAL_TABLET | ORAL | Status: AC
  Administered 2019-03-18: 5 mg via ORAL
  Filled 2019-03-18: qty 1

## 2019-03-18 MED ORDER — HYDRALAZINE HCL 20 MG/ML IJ SOLN: 5.0000 mg | INTRAMUSCULAR | Status: DC | PRN

## 2019-03-18 MED ORDER — MIDAZOLAM HCL 2 MG/2ML IJ SOLN
INTRAMUSCULAR | Status: AC
  Filled 2019-03-18: qty 2

## 2019-03-18 MED ORDER — LIDOCAINE HCL (PF) 1 % IJ SOLN
INTRAMUSCULAR | Status: DC | PRN
  Administered 2019-03-18: 15 mL

## 2019-03-18 MED ORDER — SODIUM CHLORIDE 0.9% FLUSH: 3.0000 mL | INTRAVENOUS | Status: DC | PRN

## 2019-03-18 MED ORDER — HEPARIN SODIUM (PORCINE) 1000 UNIT/ML IJ SOLN
INTRAMUSCULAR | Status: AC
  Filled 2019-03-18: qty 1

## 2019-03-18 MED ORDER — HEPARIN (PORCINE) IN NACL 1000-0.9 UT/500ML-% IV SOLN
INTRAVENOUS | Status: AC
  Filled 2019-03-18: qty 1000

## 2019-03-18 MED ORDER — SODIUM CHLORIDE 0.9 % IV SOLN: 250.0000 mL | INTRAVENOUS | Status: DC | PRN

## 2019-03-18 MED ORDER — HEPARIN (PORCINE) 25000 UT/250ML-% IV SOLN: 1150.0000 [IU]/h | INTRAVENOUS | Status: DC

## 2019-03-18 MED ORDER — FENTANYL CITRATE (PF) 100 MCG/2ML IJ SOLN
INTRAMUSCULAR | Status: DC | PRN
  Administered 2019-03-18 (×2): 50 ug via INTRAVENOUS

## 2019-03-18 MED ORDER — DOXERCALCIFEROL 4 MCG/2ML IV SOLN
INTRAVENOUS | Status: AC
  Administered 2019-03-18: 3 ug via INTRAVENOUS
  Filled 2019-03-18: qty 2

## 2019-03-18 MED ORDER — ONDANSETRON HCL 4 MG/2ML IJ SOLN: 4.0000 mg | Freq: Four times a day (QID) | INTRAMUSCULAR | Status: DC | PRN

## 2019-03-18 MED ORDER — HEPARIN (PORCINE) 25000 UT/250ML-% IV SOLN
1200.0000 [IU]/h | INTRAVENOUS | Status: DC
  Administered 2019-03-18: 1150 [IU]/h via INTRAVENOUS
  Administered 2019-03-19: 1200 [IU]/h via INTRAVENOUS
  Filled 2019-03-18 (×4): qty 250

## 2019-03-18 MED ORDER — FENTANYL CITRATE (PF) 100 MCG/2ML IJ SOLN
INTRAMUSCULAR | Status: AC
  Filled 2019-03-18: qty 2

## 2019-03-18 MED ORDER — WARFARIN SODIUM 3 MG PO TABS
6.0000 mg | ORAL_TABLET | Freq: Once | ORAL | Status: AC
  Administered 2019-03-18: 6 mg via ORAL
  Filled 2019-03-18: qty 2

## 2019-03-18 MED ORDER — HEPARIN (PORCINE) IN NACL 1000-0.9 UT/500ML-% IV SOLN
INTRAVENOUS | Status: DC | PRN
  Administered 2019-03-18: 500 mL

## 2019-03-18 MED ORDER — IODIXANOL 320 MG/ML IV SOLN
INTRAVENOUS | Status: DC | PRN
  Administered 2019-03-18: 195 mL via INTRA_ARTERIAL

## 2019-03-18 MED ORDER — HEPARIN SODIUM (PORCINE) 1000 UNIT/ML IJ SOLN
INTRAMUSCULAR | Status: AC
  Administered 2019-03-18: 3400 [IU]
  Filled 2019-03-18: qty 4

## 2019-03-18 MED ORDER — FENTANYL CITRATE (PF) 100 MCG/2ML IJ SOLN
50.0000 ug | Freq: Once | INTRAMUSCULAR | Status: AC
  Administered 2019-03-18: 50 ug via INTRAVENOUS

## 2019-03-18 MED ORDER — WARFARIN - PHARMACIST DOSING INPATIENT
Freq: Every day | Status: DC
  Administered 2019-03-19: 17:00:00

## 2019-03-18 MED ORDER — LIDOCAINE HCL (PF) 1 % IJ SOLN
INTRAMUSCULAR | Status: AC
  Filled 2019-03-18: qty 30

## 2019-03-18 MED ORDER — SODIUM CHLORIDE 0.9 % IV SOLN
INTRAVENOUS | Status: AC | PRN
  Administered 2019-03-18: 10 mL/h via INTRAVENOUS

## 2019-03-18 MED ORDER — FENTANYL CITRATE (PF) 100 MCG/2ML IJ SOLN
INTRAMUSCULAR | Status: AC
  Administered 2019-03-18: 50 ug via INTRAVENOUS
  Filled 2019-03-18: qty 2

## 2019-03-18 SURGICAL SUPPLY — 33 items
BAG SNAP BAND KOVER 36X36 (MISCELLANEOUS) ×1 IMPLANT
BALL STERLING OTW 2.5X150X150 (BALLOONS) ×1
BALLN MUSTANG 5X60X135 (BALLOONS) ×3
BALLN STERLING OTW 2.5X150X150 (BALLOONS) ×2
BALLN STERLING OTW 3X220X150 (BALLOONS) ×3
BALLOON MUSTANG 5X60X135 (BALLOONS) IMPLANT
BALLOON STERLING OTW 3X220X150 (BALLOONS) IMPLANT
BALLOON STRLNG OTW 2.5X150X150 (BALLOONS) IMPLANT
CATH CXI 2.6F 65 ST (CATHETERS) ×3
CATH CXI SUPP 2.6F 150 ST (CATHETERS) ×1 IMPLANT
CATH OMNI FLUSH 5F 65CM (CATHETERS) ×1 IMPLANT
CATH SPRT STRG 65X2.6FR ACPT (CATHETERS) IMPLANT
CATH TEMPO AQUA 5F 100CM (CATHETERS) ×1 IMPLANT
CLOSURE MYNX CONTROL 6F/7F (Vascular Products) ×1 IMPLANT
DEVICE ONE SNARE 10MM (MISCELLANEOUS) ×1 IMPLANT
GLIDEWIRE ADV .035X260CM (WIRE) ×1 IMPLANT
GLIDEWIRE ANGLED NITR .018X260 (WIRE) ×1 IMPLANT
KIT ENCORE 26 ADVANTAGE (KITS) ×1 IMPLANT
KIT MICROPUNCTURE NIT STIFF (SHEATH) ×1 IMPLANT
KIT PV (KITS) ×3 IMPLANT
PATCH THROMBIX TOPICAL PLAIN (HEMOSTASIS) ×2 IMPLANT
SHEATH FLEX ANSEL ST 6FR 45CM (SHEATH) ×1 IMPLANT
SHEATH MICROPUNCTURE PEDAL 4FR (SHEATH) ×1 IMPLANT
SHEATH PINNACLE 5F 10CM (SHEATH) ×1 IMPLANT
SHEATH PINNACLE 6F 10CM (SHEATH) ×1 IMPLANT
SHEATH PROBE COVER 6X72 (BAG) ×1 IMPLANT
STENT ELUVIA 6X60X130 (Permanent Stent) ×1 IMPLANT
STOPCOCK MORSE 400PSI 3WAY (MISCELLANEOUS) ×1 IMPLANT
SYR MEDRAD MARK V 150ML (SYRINGE) ×1 IMPLANT
TRANSDUCER W/STOPCOCK (MISCELLANEOUS) ×3 IMPLANT
TRAY PV CATH (CUSTOM PROCEDURE TRAY) ×3 IMPLANT
WIRE BENTSON .035X145CM (WIRE) ×1 IMPLANT
WIRE G V18X300CM (WIRE) ×2 IMPLANT

## 2019-03-18 NOTE — Progress Notes (Signed)
  Progress Note    03/18/2019 7:21 AM * No surgery date entered *  Subjective:  No overnight issues  Vitals:   03/18/19 0500 03/18/19 0610  BP: 97/83 117/84  Pulse: 74 71  Resp:    Temp:    SpO2: 99% 100%    Physical Exam: [email or web address] Left hand grip strength 4/5 Right 4th toe with ischemic appearing changes  CBC    Component Value Date/Time   WBC 14.5 (H) 03/18/2019 0304   RBC 3.23 (L) 03/18/2019 0304   HGB 9.6 (L) 03/18/2019 0304   HGB 11.7 (L) 09/07/2016 1457   HCT 30.1 (L) 03/18/2019 0304   HCT 37.5 09/07/2016 1457   PLT 203 03/18/2019 0304   PLT 279 09/07/2016 1457   MCV 93.2 03/18/2019 0304   MCV 81 09/07/2016 1457   MCH 29.7 03/18/2019 0304   MCHC 31.9 03/18/2019 0304   RDW 14.4 03/18/2019 0304   RDW 14.9 09/07/2016 1457   LYMPHSABS 1.7 03/15/2019 1807   LYMPHSABS 1.3 09/07/2016 1457   MONOABS 0.7 03/15/2019 1807   EOSABS 0.6 (H) 03/15/2019 1807   EOSABS 0.2 09/07/2016 1457   BASOSABS 0.0 03/15/2019 1807   BASOSABS 0.0 09/07/2016 1457    BMET    Component Value Date/Time   NA 132 (L) 03/18/2019 0304   NA 144 05/11/2017 0938   K 4.8 03/18/2019 0304   CL 96 (L) 03/18/2019 0304   CO2 20 (L) 03/18/2019 0304   GLUCOSE 94 03/18/2019 0304   BUN 67 (H) 03/18/2019 0304   BUN 68 (H) 05/11/2017 0938   CREATININE 10.85 (H) 03/18/2019 0304   CREATININE 1.63 (H) 11/09/2016 1505   CALCIUM 9.1 03/18/2019 0304   CALCIUM 8.7 06/27/2017 1152   GFRNONAA 5 (L) 03/18/2019 0304   GFRNONAA 48 (L) 11/09/2016 1505   GFRAA 6 (L) 03/18/2019 0304   GFRAA 55 (L) 11/09/2016 1505    INR    Component Value Date/Time   INR 1.7 (H) 03/18/2019 0304     Intake/Output Summary (Last 24 hours) at 03/18/2019 0721 Last data filed at 03/18/2019 0600 Gross per 24 hour  Intake 1223.89 ml  Output -  Net 1223.89 ml     Assessment:  53 y.o. male is here with ischemic changes to right 4th toe Plan: pv lab today for right lower extremity angiogram   Brandon C. Donzetta Matters, MD  [facility] Office: [phone] Pager: [phone]  03/18/2019 7:21 AM

## 2019-03-18 NOTE — Progress Notes (Signed)
Patient ID: [name], male   DOB: [birth year], 53 y.o.   MRN: [id number]   Advanced Heart Failure VAD Team Note  PCP-Cardiologist: No primary care provider on file.   Subjective:    Still has right foot pain.  No abdominal pain/nausea.   Right foot plain film unremarkable.   Peripheral angiogram today with 80% stenosis right SFA and occluded right PT (similar disease left SFA).  He had stent right SFA and angioplasty to PT.   LVAD INTERROGATION:  HeartMate 3 LVAD:   Flow 3.6 liters/min, speed 5500, power 4.4, PI 5.9.  2 PI events yesterday.   Objective:    Vital Signs:   Temp:  [97.7 F (36.5 C)-98.6 F (37 C)] 98.1 F (36.7 C) (07/13 0420) Pulse Rate:  [34-85] 72 (07/13 0943) Resp:  [6-26] 10 (07/13 0943) BP: (85-138)/(38-106) 127/96 (07/13 0943) SpO2:  [96 %-100 %] 100 % (07/13 0943) Weight:  [84.5 kg] 84.5 kg (07/13 0500)   Mean arterial Pressure 80s-90s  Intake/Output:   Intake/Output Summary (Last 24 hours) at 03/18/2019 1012 Last data filed at 03/18/2019 0600 Gross per 24 hour  Intake 709.61 ml  Output -  Net 709.61 ml     Physical Exam    General: Well appearing this am. NAD.  HEENT: Normal. Neck: Supple, JVP 7-8 cm. Carotids OK.  Cardiac:  Mechanical heart sounds with LVAD hum present.  Lungs:  CTAB, normal effort.  Abdomen:  NT, ND, no HSM. No bruits or masses. +BS  LVAD exit site: Well-healed and incorporated. Dressing dry and intact. No erythema or drainage. Stabilization device present and accurately applied. Driveline dressing changed daily per sterile technique. Extremities:  Warm and dry. No cyanosis, clubbing, rash, or edema. Ulcer 4th right toe.  Neuro:  Alert & oriented x 3. Cranial nerves grossly intact. Moves all 4 extremities w/o difficulty. Affect pleasant    Telemetry   NSR 80s (personally reviewed)   Labs   Basic Metabolic Panel: Recent Labs  Lab 03/15/19 1807 03/16/19 0633 03/17/19 0443 03/18/19 0304  NA 135 134* 134*  132*  K 3.7 3.8 4.9 4.8  CL 97* 96* 98 96*  CO2 24 23 20* 20*  GLUCOSE 130* 152* 87 94  BUN 19 35* 51* 67*  CREATININE 5.34* 6.88* 9.06* 10.85*  CALCIUM 10.0 9.3 9.4 9.1    Liver Function Tests: Recent Labs  Lab 03/15/19 1807  AST 21  ALT 18  ALKPHOS 101  BILITOT 0.7  PROT 7.6  ALBUMIN 3.5   No results for input(s): LIPASE, AMYLASE in the last 168 hours. No results for input(s): AMMONIA in the last 168 hours.  CBC: Recent Labs  Lab 03/15/19 1807 03/16/19 0633 03/17/19 0443 03/18/19 0304  WBC 12.7* 11.7* 15.3* 14.5*  NEUTROABS 9.4*  --   --   --   HGB 11.6* 10.9* 11.0* 9.6*  HCT 35.3* 34.7* 33.9* 30.1*  MCV 91.7 94.6 92.4 93.2  PLT 197 198 198 203    INR: Recent Labs  Lab 03/15/19 03/15/19 1807 03/16/19 0935 03/17/19 0443 03/18/19 0304  INR 2.9  1.9* 1.8* 1.9* 1.9* 1.7*    Other results:  EKG:    Imaging   No results found.   Medications:     Scheduled Medications: . amiodarone  200 mg Oral Daily  . aspirin EC  81 mg Oral Daily  . atorvastatin  40 mg Oral q1800  . Chlorhexidine Gluconate Cloth  6 each Topical Daily  . Chlorhexidine Gluconate Cloth  6 each Topical Daily  . citalopram  20 mg Oral Daily  . doxercalciferol  3 mcg Intravenous Q M,W,F-HD  . dronabinol  2.5 mg Oral BID AC  . erythromycin  250 mg Oral TID  . gabapentin  100 mg Oral BID  . insulin aspart  0-15 Units Subcutaneous TID WC  . insulin aspart  0-5 Units Subcutaneous QHS  . insulin aspart  7 Units Subcutaneous QPM  . insulin glargine  5 Units Subcutaneous Daily  . metoCLOPramide  10 mg Oral TID  . midodrine  5 mg Oral Q M,W,F  . multivitamin  1 tablet Oral Daily  . pantoprazole  40 mg Oral BID  . sevelamer carbonate  1,600 mg Oral BID WC  . sevelamer carbonate  4,000 mg Oral TID WC  . sodium chloride flush  10-40 mL Intracatheter Q12H  . sodium chloride flush  3 mL Intravenous Q12H    Infusions: . sodium chloride    . [START ON 03/20/2019] ferric gluconate  (FERRLECIT/NULECIT) IV    . heparin 1,150 Units/hr (03/18/19 0654)    PRN Medications: sodium chloride, acetaminophen, acetaminophen, docusate sodium, fentaNYL (SUBLIMAZE) injection, hydrALAZINE, labetalol, lidocaine-prilocaine, LORazepam, ondansetron (ZOFRAN) IV, ondansetron (ZOFRAN) IV, promethazine **OR** promethazine, sodium chloride flush, sodium chloride flush, traMADol    Assessment/Plan:    1. Right foot pain/PAD: Ulcer on 4th toe,  ischemic due to PAD. Peripheral angiogram today with 80% right SFA stenosis and occluded right PT.  He had stenting of SFA and PTCA of PT with restoration of flow.  - Continue heparin gtt until INR > 1.8 => restart later this morning.  Restart warfarin.  - Continue ASA 81.   - Wound consult for foot ulcer.  2. Chronic systolic CHF: Ischemic cardiomyopathy. St Jude ICD. NSTEMI in March 2018 with DES to LAD and RCA, complicated by cardiogenic shock, low output requiring milrinone. Echo 8/18 with EF 20-25%, moderate MR. CPX (8/18) with severe functional impairment due to HF. He is s/p HM3 LVAD placement. He had pump thrombosis 6/19 due to [facility] outflow graft kinking. He had pump exchange for another HM3.  - Volume managed at dialysis, had last on Friday and will have later today.  - Continue ASA 81. INR 1.7 today, will start heparin gtt at appropriate interval after cath then warfarin this evening.  Can stop heparin gtt when INR > 1.8.   - He has been [facility] for evaluation for heart/kidney transplantand recently saw Dr Mosetta Pigeon. 3. ESRD: Developed peri-operatively after HM3 placement. 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  -He is currently looking for a living donor. - HD today.  4. CAD: NSTEMI in 3/18. LHC with 99% ulcerated lesion proximal RCA with left to right collaterals, 95% mid LAD stenosis after mid LAD stent. s/p PCI to RCA and LAD on 11/25/16.  -Continue statin and ASA.  4. h/o DVTs: Factor V Leiden heterozygote. - Anticoagulated.  5. Diabetic gastroparesis: Difficult to manage. He has seen gastroparesis specialist at [facility]. Surprisingly, gastric emptying study was normal. However, electrogastrogram showed poor gastric accomodation/capacity. Therefore, small frequent meals recommended. Recently doing better.   - Continue reglan 5 mg tid/ac and Marinol.  - Continue Protonix bid.  - He will continue erythromycinindefinitely.  6. DM: Cover with SSI and home glargine.  7. HTN:MAP has been lower with dialysis, now off Bidil and getting midodrine prior to HD.  8. Atrial flutter: Paroxysmal. He has been  in NSR recently.  - Continue amiodarone. 9. Left hand tingling and grip weakness: Present since he developed left arm hematoma from failed AV graft. 10. Difficult IV access: Placed IJ.   I reviewed the LVAD parameters from today, and compared the results to the patient's prior recorded data.  No programming changes were made.  The LVAD is functioning within specified parameters.  The patient performs LVAD self-test daily.  LVAD interrogation was negative for any significant power changes, alarms or PI events/speed drops.  LVAD equipment check completed and is in good working order.  Back-up equipment present.   LVAD education done on emergency procedures and precautions and reviewed exit site care.  Length of Stay: 3  Loralie Champagne, MD 03/18/2019, 10:12 AM  VAD Team --- VAD ISSUES ONLY--- Pager [phone] (7am - 7am)  Advanced Heart Failure Team  Pager [phone] (M-F; 7a - 4p)  Please contact [facility] for night-coverage after hours (4p -7a ) and weekends on [email or web address]

## 2019-03-18 NOTE — Progress Notes (Signed)
ANTICOAGULATION CONSULT NOTE - Follow Up Consult  Pharmacy Consult for heparin Indication: LVAD  Allergies  Allergen Reactions  . Metformin And Related Diarrhea    Patient Measurements: Height: 5' 7"  (170.2 cm)(per chart) Weight: 186 lb 4.6 oz (84.5 kg) IBW/kg (Calculated) : 66.1 Heparin Dosing Weight: 82.2 kg  Vital Signs: Temp: 98.1 F (36.7 C) (07/13 0420) Temp Source: Oral (07/13 0420) BP: 127/96 (07/13 0943) Pulse Rate: 72 (07/13 0943)  Labs: Recent Labs    03/16/19 6712  03/16/19 0935 03/16/19 1753 03/17/19 0443 03/18/19 0304 03/18/19 0327  HGB 10.9*  --   --   --  11.0* 9.6*  --   HCT 34.7*  --   --   --  33.9* 30.1*  --   PLT 198  --   --   --  198 203  --   LABPROT  --   --  21.7*  --  21.1* 19.5*  --   INR  --   --  1.9*  --  1.9* 1.7*  --   HEPARINUNFRC  --    < > 0.63 0.69 0.65  --  0.55  CREATININE 6.88*  --   --   --  9.06* 10.85*  --    < > = values in this interval not displayed.    Estimated Creatinine Clearance: 8.1 mL/min (A) (by C-G formula based on SCr of 10.85 mg/dL (H)).   Medical History: Past Medical History:  Diagnosis Date  . Angina   . ASCVD (arteriosclerotic cardiovascular disease)    , Anterior infarction 2005, LAD diagonal bifurcation intervention 03/2004  . Automatic implantable cardiac defibrillator -[employer]. Jude's       . Benign neoplasm of colon   . CHF (congestive heart failure) ([place])   . Chronic systolic heart failure ([place])   . Coronary artery disease     Widely patent previously placed stents in the left anterior   . Crohn's disease ([place])   . Deep venous thrombosis (HCC)    Recurrent-on Coumadin  . Dialysis patient ([place])   . Dyspnea   . Gastroparesis   . GERD (gastroesophageal reflux disease)   . High cholesterol   . Hyperlipidemia   . Hypersomnolent    Previous diagnosis of narcolepsy  . Hypertension, essential   . Ischemic cardiomyopathy    Ejection fraction 15-20% catheterization 2010  . Type II or  unspecified type diabetes mellitus without mention of complication, not stated as uncontrolled   . Unspecified gastritis and gastroduodenitis without mention of hemorrhage     Medications:  Scheduled:  . amiodarone  200 mg Oral Daily  . aspirin EC  81 mg Oral Daily  . atorvastatin  40 mg Oral q1800  . Chlorhexidine Gluconate Cloth  6 each Topical Daily  . Chlorhexidine Gluconate Cloth  6 each Topical Daily  . citalopram  20 mg Oral Daily  . doxercalciferol  3 mcg Intravenous Q M,W,F-HD  . dronabinol  2.5 mg Oral BID AC  . erythromycin  250 mg Oral TID  . gabapentin  100 mg Oral BID  . insulin aspart  0-15 Units Subcutaneous TID WC  . insulin aspart  0-5 Units Subcutaneous QHS  . insulin aspart  7 Units Subcutaneous QPM  . insulin glargine  5 Units Subcutaneous Daily  . metoCLOPramide  10 mg Oral TID  . midodrine  5 mg Oral Q M,W,F  . multivitamin  1 tablet Oral Daily  . pantoprazole  40 mg Oral BID  .  sevelamer carbonate  1,600 mg Oral BID WC  . sevelamer carbonate  4,000 mg Oral TID WC  . sodium chloride flush  10-40 mL Intracatheter Q12H  . sodium chloride flush  3 mL Intravenous Q12H   Infusions:  . sodium chloride    . [START ON 03/20/2019] ferric gluconate (FERRLECIT/NULECIT) IV      Assessment: 85 yoM with HM3 LVAD admitted with R foot pain and concern for ischemic foot. Pt on warfarin PTA, held and started on IV heparin bridge. Pt s/p PV lab today, to restart heparin 2hr after and resume warfarin tonight.  Heparin level was therapeutic on 1150 units/hr this morning, INR remains subtherapeutic at 1.7. LDH and CBC stable.  **PTA Warfarin Dose = 7.53m Tues/Fri, 589mall other days  Goal of Therapy:  Heparin level 0.3-0.7 units/ml Monitor platelets by anticoagulation protocol: Yes   Plan:  -Restart heparin no bolus at 1150 units/hr at 1145 this morning -Warfarin 31m40mO x1 tonight - slight boost since warfarin has been held -Check heparin level in 6hr -Daily heparin  level, CBC, INR, LDH   MicArrie SenateharmD, BCPS Clinical Pharmacist [phone] check AMION for all [facility] 03/18/2019

## 2019-03-18 NOTE — Progress Notes (Signed)
ANTICOAGULATION CONSULT NOTE - Follow Up Consult  Pharmacy Consult for heparin Indication: LVAD  Allergies  Allergen Reactions  . Metformin And Related Diarrhea    Patient Measurements: Height: 5' 7"  (170.2 cm)(per chart) Weight: 183 lb 6.8 oz (83.2 kg) IBW/kg (Calculated) : 66.1 Heparin Dosing Weight: 82.2 kg  Vital Signs: Temp: 98 F (36.7 C) (07/13 1620) Temp Source: Oral (07/13 1620) BP: 92/77 (07/13 1800) Pulse Rate: 90 (07/13 1800)  Labs: Recent Labs    03/16/19 4270 03/16/19 0935  03/17/19 0443 03/18/19 0304 03/18/19 0327 03/18/19 2041  HGB 10.9*  --   --  11.0* 9.6*  --   --   HCT 34.7*  --   --  33.9* 30.1*  --   --   PLT 198  --   --  198 203  --   --   LABPROT  --  21.7*  --  21.1* 19.5*  --   --   INR  --  1.9*  --  1.9* 1.7*  --   --   HEPARINUNFRC  --  0.63   < > 0.65  --  0.55 0.34  CREATININE 6.88*  --   --  9.06* 10.85*  --   --    < > = values in this interval not displayed.    Estimated Creatinine Clearance: 8 mL/min (A) (by C-G formula based on SCr of 10.85 mg/dL (H)).   Medical History: Past Medical History:  Diagnosis Date  . Angina   . ASCVD (arteriosclerotic cardiovascular disease)    , Anterior infarction 2005, LAD diagonal bifurcation intervention 03/2004  . Automatic implantable cardiac defibrillator -St. Jude's       . Benign neoplasm of colon   . CHF (congestive heart failure) ([place])   . Chronic systolic heart failure ([place])   . Coronary artery disease     Widely patent previously placed stents in the left anterior   . Crohn's disease ([place])   . Deep venous thrombosis (HCC)    Recurrent-on Coumadin  . Dialysis patient ([place])   . Dyspnea   . Gastroparesis   . GERD (gastroesophageal reflux disease)   . High cholesterol   . Hyperlipidemia   . Hypersomnolent    Previous diagnosis of narcolepsy  . Hypertension, essential   . Ischemic cardiomyopathy    Ejection fraction 15-20% catheterization 2010  . Type II or unspecified  type diabetes mellitus without mention of complication, not stated as uncontrolled   . Unspecified gastritis and gastroduodenitis without mention of hemorrhage     Medications:  Scheduled:  . amiodarone  200 mg Oral Daily  . aspirin EC  81 mg Oral Daily  . atorvastatin  40 mg Oral q1800  . Chlorhexidine Gluconate Cloth  6 each Topical Daily  . Chlorhexidine Gluconate Cloth  6 each Topical Daily  . citalopram  20 mg Oral Daily  . doxercalciferol  3 mcg Intravenous Q M,W,F-HD  . dronabinol  2.5 mg Oral BID AC  . erythromycin  250 mg Oral TID  . gabapentin  100 mg Oral BID  . insulin aspart  0-15 Units Subcutaneous TID WC  . insulin aspart  0-5 Units Subcutaneous QHS  . insulin aspart  7 Units Subcutaneous QPM  . insulin glargine  5 Units Subcutaneous Daily  . metoCLOPramide  10 mg Oral TID  . midodrine  5 mg Oral Q M,W,F  . multivitamin  1 tablet Oral Daily  . pantoprazole  40 mg Oral BID  .  sevelamer carbonate  1,600 mg Oral BID WC  . sevelamer carbonate  4,000 mg Oral TID WC  . sodium chloride flush  10-40 mL Intracatheter Q12H  . sodium chloride flush  3 mL Intravenous Q12H  . Warfarin - Pharmacist Dosing Inpatient   Does not apply q1800   Infusions:  . sodium chloride    . [START ON 03/20/2019] ferric gluconate (FERRLECIT/NULECIT) IV    . heparin 1,150 Units/hr (03/18/19 1312)    Assessment: 57 yoM with HM3 LVAD admitted with R foot pain and concern for ischemic foot. Pt on warfarin PTA, held and started on IV heparin bridge. Pt s/p PV lab today, to restart heparin 2hr after and resume warfarin tonight.  Heparin level 0.3 at goal on heparin drip rate 1150 units/hr   INR remains subtherapeutic at 1.7. LDH and CBC stable.  **PTA Warfarin Dose = 7.32m Tues/Fri, 542mall other days  Goal of Therapy:  Heparin level 0.3-0.7 units/ml Monitor platelets by anticoagulation protocol: Yes   Plan:  -Continue  heparin drip rate 1150 units/hr  -Daily heparin level, CBC, INR,  LDH   LiBonnita Nasutiharm.D. CPP, BCPS Clinical Pharmacist [phone]/13/2020 9:37 PM

## 2019-03-18 NOTE — Progress Notes (Signed)
Renal Navigator notified OP HD clinic/[facility] of patient's admission and negative COVID 19 test result in order to provide continuity of care.  Alphonzo Cruise, [place] Renal Navigator [phone]

## 2019-03-18 NOTE — Procedures (Signed)
Patient was seen on dialysis and the procedure was supervised.  BFR 350  Via TDC BP is  Adequate- goal of 2000.   Patient appears to be tolerating treatment well  [name] 03/18/2019

## 2019-03-18 NOTE — Op Note (Signed)
Patient name: [name] MRN: [id number] DOB: [birth year] Sex: male  03/18/2019 Pre-operative Diagnosis: End-stage renal disease, presence of ventricular assist device, right fourth toe ulceration with critical limb ischemia Post-operative diagnosis:  Same Surgeon:  Eda Paschal. Donzetta Matters, MD Procedure Performed: 1.  Ultrasound-guided cannulation left common femoral artery 2.  Aortogram with bilateral lower extremity runoff 3.  Stent of right SFA with 6 x 60 mm elluvia 4.  Ultrasound-guided cannulation right posterior tibial artery 5.  Balloon angioplasty right posterior tibial artery with 3 and 2.5 mm balloons 6.  Moderate sedation with fentanyl and Versed for 66 minutes 7.  Maximize closure left common femoral artery   Indications: 52 year old male with history of end-stage renal disease also has a ventricular assist device.  He was noted to have gangrenous changes to his right fourth toe.  Duplex did not really demonstrate any abnormalities other than the SFA he was indicated for angiography possible intervention on the right.  Findings: His aorta and iliac segments are free of any flow-limiting stenosis.  The right side is the site of interest.  There is approximately 80% SFA stenosis distally which is resolved 0% after stenting.  His peroneal artery runs off to the mid calf.  His posterior tibial artery occludes after several centimeters proximally and reconstitutes distally in the calf.  After balloon angioplasty there is no flow-limiting stenosis.  Does not appear to be any flow-limiting dissection although there is may be minimal dissection distally in the artery.  Does have much improved flow through the posterior tibial artery at completion.  The left side SFA has a similar stenosis as the right.  Appears peroneal artery is the dominant runoff although could not see if it runs all the way to the foot.   Procedure:  The patient was identified in the holding area and taken to room 8.   The patient was then placed supine on the table and prepped and draped in the usual sterile fashion.  A time out was called.  Ultrasound was used to evaluate the left common femoral artery which was noted to be patent.  This was anesthetized 1% lidocaine cannulated micropuncture needle followed by wire sheath.  An image was saved the permanent record.  Bentson wire was placed followed by 5 [place] sheath and Omni catheter.  Moderate sedation was administered and his vital signs were monitored throughout this case.  Aortogram was performed followed bilateral lower extremity runoff with the above findings.  We crossed the bifurcation Glidewire advantage and Omni catheter.  6 French sheath was placed patient was fully heparinized.  We did re-heparinized throughout the case.  Glidewire band was placed the level of the knee as well as a straight cath and we performed angiogram from there demonstrated the significant tibial disease.  We attempted to cross from above using CXI catheter V 18 but caused perforation could not cross.  The right ankle was prepped out.  Ultrasound was used to identify the posterior tibial artery on the right.  This was cannulated with micropuncture needle followed by a 4 French micro sheath.  After this we were able to get a V 18 wire through with CXI catheter.  We confirmed intraluminal access of the popliteal artery.  This was snared.  We then ballooned first with 2.5 followed by 3 mm balloon of the entire posterior tibial artery.  Completion demonstrated what did not appear to be any residual stenosis.  There was possibly a dissection there but it does not  appear to be flow-limiting he had great bleeding at the level of the ankle.  Satisfied with this we turned our attention towards the right SFA.  Angiography confirmed the stenosis again.  elluvia stent was placed postdilated with 5 mm balloon.  While the balloon was inflated we removed the sheath from below and held pressure.  Completion  angiography demonstrated the above findings.  Satisfied we exchanged for short 6 [place] sheath deployed a minx device.  He tolerated procedure without immediate complication.  Contrast: 195cc  Brandon C. Donzetta Matters, MD [facility] [place] Office: [phone] Pager: [phone]

## 2019-03-18 NOTE — Progress Notes (Signed)
Subjective:  Had arteriogram with angioplasty and stent on right - says foot is still hurting but otherwise feels well  Objective Vital signs in last 24 hours: Vitals:   03/18/19 0928 03/18/19 0933 03/18/19 0938 03/18/19 0943  BP: (!) 121/91 (!) 127/93 (!) 127/92 (!) 127/96  Pulse: 70 73 76 72  Resp: (!) 9 11 (!) 9 10  Temp:      TempSrc:      SpO2: 100% 100% 100% 100%  Weight:      Height:       Weight change: 1.4 kg  Intake/Output Summary (Last 24 hours) at 03/18/2019 1134 Last data filed at 03/18/2019 1000 Gross per 24 hour  Intake 710.38 ml  Output -  Net 710.38 ml    Dialysis Orders: Center: [place]  on MWF . EDW 84 kg   HD Bath 2 K, 3 CA  Time 4hr 61mn  Heparin NONE. Access R IJ Perm cath      Hec 3 mcg IV/HD  Venofer 50 mg q wkly hd  NO ESA  Last hgb 10.8 ( was last on Mircera  60 mcg 6/03 )      Assessment/ Plan: Pt is a 53y.o. yo male with ESRD and LVAD who was admitted on 03/15/2019 with ischemic right foot   Assessment/Plan: 1. PAD-  S/p arteriogram and angioplasty and stent to right side- also ulcer on right toes 2. [facility]  3. Anemia- hgb seems to have dropped with procedure - will give esa if is truly in the 9's on recheck- already on iron  4. Secondary hyperparathyroidism- cont home hectorol and renvela  5. HTN/volume- stable and fine- on midodrine 6. LVAD-  Per protocols - in 2[place]7. Dispo- he tells me home once his INR is up   [facility] Metabolic Panel: Recent Labs  Lab 03/16/19 0633 03/17/19 0443 03/18/19 0304  NA 134* 134* 132*  K 3.8 4.9 4.8  CL 96* 98 96*  CO2 23 20* 20*  GLUCOSE 152* 87 94  BUN 35* 51* 67*  CREATININE 6.88* 9.06* 10.85*  CALCIUM 9.3 9.4 9.1   Liver Function Tests: Recent Labs  Lab 03/15/19 1807  AST 21  ALT 18  ALKPHOS 101  BILITOT 0.7  PROT 7.6  ALBUMIN 3.5   No results for input(s): LIPASE, AMYLASE in the last 168 hours. No results for  input(s): AMMONIA in the last 168 hours. CBC: Recent Labs  Lab 03/15/19 1807 03/16/19 0633 03/17/19 0443 03/18/19 0304  WBC 12.7* 11.7* 15.3* 14.5*  NEUTROABS 9.4*  --   --   --   HGB 11.6* 10.9* 11.0* 9.6*  HCT 35.3* 34.7* 33.9* 30.1*  MCV 91.7 94.6 92.4 93.2  PLT 197 198 198 203   Cardiac Enzymes: No results for input(s): CKTOTAL, CKMB, CKMBINDEX, TROPONINI in the last 168 hours. CBG: Recent Labs  Lab 03/17/19 0707 03/17/19 1154 03/17/19 1600 03/17/19 2151 03/18/19 0612  GLUCAP 78 215* 88 94 86    Iron Studies: No results for input(s): IRON, TIBC, TRANSFERRIN, FERRITIN in the last 72 hours. Studies/Results: No results found. Medications: Infusions: . sodium chloride    . [START ON 03/20/2019] ferric gluconate (FERRLECIT/NULECIT) IV    . heparin      Scheduled Medications: . amiodarone  200 mg Oral Daily  . aspirin EC  81 mg Oral Daily  . atorvastatin  40 mg Oral q1800  .  Chlorhexidine Gluconate Cloth  6 each Topical Daily  . Chlorhexidine Gluconate Cloth  6 each Topical Daily  . citalopram  20 mg Oral Daily  . doxercalciferol  3 mcg Intravenous Q M,W,F-HD  . dronabinol  2.5 mg Oral BID AC  . erythromycin  250 mg Oral TID  . gabapentin  100 mg Oral BID  . insulin aspart  0-15 Units Subcutaneous TID WC  . insulin aspart  0-5 Units Subcutaneous QHS  . insulin aspart  7 Units Subcutaneous QPM  . insulin glargine  5 Units Subcutaneous Daily  . metoCLOPramide  10 mg Oral TID  . midodrine  5 mg Oral Q M,W,F  . multivitamin  1 tablet Oral Daily  . pantoprazole  40 mg Oral BID  . sevelamer carbonate  1,600 mg Oral BID WC  . sevelamer carbonate  4,000 mg Oral TID WC  . sodium chloride flush  10-40 mL Intracatheter Q12H  . sodium chloride flush  3 mL Intravenous Q12H  . warfarin  6 mg Oral ONCE-1800  . Warfarin - Pharmacist Dosing Inpatient   Does not apply q1800    have reviewed scheduled and prn medications.  Physical Exam: General: NAD Heart: RRR Lungs:  mostly clear Abdomen: soft, non tender Extremities: no edema- right foot cool  Dialysis Access: left sided TDC     03/18/2019,11:34 AM  LOS: 3 days

## 2019-03-18 NOTE — Consult Note (Signed)
[place] Nurse wound consult note Patient receiving care in [facility] 2H09. Reason for Consult: "ulcer right toe" Wound type: No wound observed.  Toes 2 - 5 of the right foot are cold.  The 4th toe is overlapped by toe 3 and pressing on toe 5.  It was very painful for the patient for me to attempt to separate the toes for assessment. Dressing procedure/placement/frequency: Weave a strip of Xeroform gauze Kellie Simmering #[id number]) between toes 3, 4, 5 on right foot. Change daily.  Thank you for the consult.  Discussed plan of care with the patient and bedside nurse Apolonio Schneiders.  [place] nurse will not follow at this time.  Please re-consult the [place] team if needed.  Val Riles, RN, MSN, CWOCN, CNS-BC, pager [phone]

## 2019-03-19 ENCOUNTER — Encounter (HOSPITAL_COMMUNITY): Payer: Self-pay | Admitting: Vascular Surgery

## 2019-03-19 ENCOUNTER — Encounter (HOSPITAL_COMMUNITY): Payer: Self-pay

## 2019-03-19 LAB — BASIC METABOLIC PANEL
Anion gap: 12 (ref 5–15)
BUN: 44 mg/dL — ABNORMAL HIGH (ref 6–20)
CO2: 23 mmol/L (ref 22–32)
Calcium: 8.4 mg/dL — ABNORMAL LOW (ref 8.9–10.3)
Chloride: 101 mmol/L (ref 98–111)
Creatinine, Ser: 8.18 mg/dL — ABNORMAL HIGH (ref 0.61–1.24)
GFR calc Af Amer: 8 mL/min — ABNORMAL LOW (ref >60)
GFR calc non Af Amer: 7 mL/min — ABNORMAL LOW (ref >60)
Glucose, Bld: 179 mg/dL — ABNORMAL HIGH (ref 70–99)
Potassium: 4.1 mmol/L (ref 3.5–5.1)
Sodium: 136 mmol/L (ref 135–145)

## 2019-03-19 LAB — PROTIME-INR
INR: 1.6 — ABNORMAL HIGH (ref 0.8–1.2)
Prothrombin Time: 19.1 seconds — ABNORMAL HIGH (ref 11.4–15.2)

## 2019-03-19 LAB — GLUCOSE, CAPILLARY
Glucose-Capillary: 85 mg/dL (ref 70–99)
Glucose-Capillary: 158 mg/dL — ABNORMAL HIGH (ref 70–99)
Glucose-Capillary: 119 mg/dL — ABNORMAL HIGH (ref 70–99)
Glucose-Capillary: 223 mg/dL — ABNORMAL HIGH (ref 70–99)

## 2019-03-19 LAB — CBC
HCT: 26.6 % — ABNORMAL LOW (ref 39.0–52.0)
Hemoglobin: 8.6 g/dL — ABNORMAL LOW (ref 13.0–17.0)
MCH: 30.3 pg (ref 26.0–34.0)
MCHC: 32.3 g/dL (ref 30.0–36.0)
MCV: 93.7 fL (ref 80.0–100.0)
Platelets: 183 10*3/uL (ref 150–400)
RBC: 2.84 MIL/uL — ABNORMAL LOW (ref 4.22–5.81)
RDW: 14.8 % (ref 11.5–15.5)
WBC: 13.3 10*3/uL — ABNORMAL HIGH (ref 4.0–10.5)
nRBC: 0 % (ref 0.0–0.2)

## 2019-03-19 LAB — HEPARIN LEVEL (UNFRACTIONATED): Heparin Unfractionated: 0.27 IU/mL — ABNORMAL LOW (ref 0.30–0.70)

## 2019-03-19 LAB — LACTATE DEHYDROGENASE: LDH: 171 U/L (ref 98–192)

## 2019-03-19 MED ORDER — CHLORHEXIDINE GLUCONATE CLOTH 2 % EX PADS
6.0000 | MEDICATED_PAD | Freq: Every day | CUTANEOUS | Status: DC
  Administered 2019-03-20 – 2019-03-21 (×2): 6 via TOPICAL

## 2019-03-19 MED ORDER — DARBEPOETIN ALFA 150 MCG/0.3ML IJ SOSY
150.0000 ug | PREFILLED_SYRINGE | INTRAMUSCULAR | Status: DC
  Administered 2019-03-20: 11:00:00 150 ug via INTRAVENOUS
  Filled 2019-03-19: qty 0.3

## 2019-03-19 MED ORDER — SEVELAMER CARBONATE 800 MG PO TABS: 1600.0000 mg | ORAL_TABLET | ORAL | Status: DC | PRN

## 2019-03-19 MED ORDER — WARFARIN SODIUM 7.5 MG PO TABS: 7.5000 mg | ORAL_TABLET | Freq: Once | ORAL | Status: DC

## 2019-03-19 MED ORDER — WARFARIN SODIUM 5 MG PO TABS
8.0000 mg | ORAL_TABLET | Freq: Once | ORAL | Status: AC
  Administered 2019-03-19: 8 mg via ORAL
  Filled 2019-03-19: qty 1

## 2019-03-19 NOTE — Progress Notes (Addendum)
ANTICOAGULATION CONSULT NOTE - Follow Up Consult  Pharmacy Consult for heparin Indication: LVAD  Allergies  Allergen Reactions  . Metformin And Related Diarrhea    Patient Measurements: Height: 5' 7"  (170.2 cm)(per chart) Weight: 182 lb 15.7 oz (83 kg)(with lvad controller) IBW/kg (Calculated) : 66.1 Heparin Dosing Weight: 82.2 kg  Vital Signs: Temp: 98.3 F (36.8 C) (07/14 0340) Temp Source: Oral (07/14 0340) BP: 96/84 (07/14 0700) Pulse Rate: 77 (07/14 0700)  Labs: Recent Labs    03/17/19 0443 03/18/19 0304 03/18/19 0327 03/18/19 2041 03/19/19 0301 03/19/19 0308  HGB 11.0* 9.6*  --   --   --  8.6*  HCT 33.9* 30.1*  --   --   --  26.6*  PLT 198 203  --   --   --  183  LABPROT 21.1* 19.5*  --   --   --  19.1*  INR 1.9* 1.7*  --   --   --  1.6*  HEPARINUNFRC 0.65  --  0.55 0.34 0.27*  --   CREATININE 9.06* 10.85*  --   --   --  8.18*    Estimated Creatinine Clearance: 10.6 mL/min (A) (by C-G formula based on SCr of 8.18 mg/dL (H)).   Medical History: Past Medical History:  Diagnosis Date  . Angina   . ASCVD (arteriosclerotic cardiovascular disease)    , Anterior infarction 2005, LAD diagonal bifurcation intervention 03/2004  . Automatic implantable cardiac defibrillator -[employer]. Jude's       . Benign neoplasm of colon   . CHF (congestive heart failure) ([place])   . Chronic systolic heart failure ([place])   . Coronary artery disease     Widely patent previously placed stents in the left anterior   . Crohn's disease ([place])   . Deep venous thrombosis (H[facility])    Recurrent-on Coumadin  . Dialysis patient ([place])   . Dyspnea   . Gastroparesis   . GERD (gastroesophageal reflux disease)   . High cholesterol   . Hyperlipidemia   . Hypersomnolent    Previous diagnosis of narcolepsy  . Hypertension, essential   . Ischemic cardiomyopathy    Ejection fraction 15-20% catheterization 2010  . Type II or unspecified type diabetes mellitus without mention of complication, not  stated as uncontrolled   . Unspecified gastritis and gastroduodenitis without mention of hemorrhage     Medications:  Scheduled:  . amiodarone  200 mg Oral Daily  . aspirin EC  81 mg Oral Daily  . atorvastatin  40 mg Oral q1800  . Chlorhexidine Gluconate Cloth  6 each Topical Daily  . Chlorhexidine Gluconate Cloth  6 each Topical Daily  . citalopram  20 mg Oral Daily  . doxercalciferol  3 mcg Intravenous Q M,W,F-HD  . dronabinol  2.5 mg Oral BID AC  . erythromycin  250 mg Oral TID  . gabapentin  100 mg Oral BID  . insulin aspart  0-15 Units Subcutaneous TID WC  . insulin aspart  0-5 Units Subcutaneous QHS  . insulin aspart  7 Units Subcutaneous QPM  . insulin glargine  5 Units Subcutaneous Daily  . metoCLOPramide  10 mg Oral TID  . midodrine  5 mg Oral Q M,W,F  . multivitamin  1 tablet Oral Daily  . pantoprazole  40 mg Oral BID  . sevelamer carbonate  1,600 mg Oral BID WC  . sevelamer carbonate  4,000 mg Oral TID WC  . sodium chloride flush  10-40 mL Intracatheter Q12H  . sodium chloride  flush  3 mL Intravenous Q12H  . Warfarin - Pharmacist Dosing Inpatient   Does not apply q1800   Infusions:  . sodium chloride    . [START ON 03/20/2019] ferric gluconate (FERRLECIT/NULECIT) IV    . heparin 1,150 Units/hr (03/19/19 0600)    Assessment: 1 yoM with HM3 LVAD admitted with R foot pain and concern for ischemic foot now s/p revascularization. Warfarin and heparin resumed post-op. Heparin level slightly below goal at 0.27, INR subtherapeutic at 1.6 after holding over the weekend, CBC trending down, LDH stable.  Heparin level was therapeutic on 1150 units/hr this morning, INR remains subtherapeutic at 1.7. LDH and CBC stable.  **PTA Warfarin Dose = 7.26m Tues/Fri, 561mall other days  Goal of Therapy:  Heparin level 0.3-0.7 units/ml Monitor platelets by anticoagulation protocol: Yes   Plan:  -Increase heparin slightly to 1200 units/hr -Warfarin 48m52mO x1 tonight - will give  slight boost from home dose to facilitate D/C tomorrow -Daily INR, heparin level, CBC, LDH   MicArrie SenateharmD, BCPS Clinical Pharmacist [phone] check AMION for all [facility] 03/19/2019

## 2019-03-19 NOTE — Progress Notes (Signed)
Patient ID: [name], male   DOB: 21-Nov-1965, 53 y.o.   MRN: [id number]   Advanced Heart Failure VAD Team Note  PCP-Cardiologist: No primary care provider on file.   Subjective:    Still has right foot pain but better.  No abdominal pain/nausea. Right PT pulse is dopplerable this morning.   Right foot plain film unremarkable.   Had HD yesterday, no problems.    INR 1.6 on IV heparin gtt and warfarin.   Peripheral angiogram 7/13 with 80% stenosis right SFA and occluded right PT (similar disease left SFA).  He had stent right SFA and angioplasty to PT.   LVAD INTERROGATION:  HeartMate 3 LVAD:   Flow 4.1 liters/min, speed 5500, power 4.3, PI 3.2.  Increased PI events after HD.   Objective:    Vital Signs:   Temp:  [97.7 F (36.5 C)-98.8 F (37.1 C)] 98.1 F (36.7 C) (07/14 0739) Pulse Rate:  [54-94] 77 (07/14 0739) Resp:  [6-21] 17 (07/14 0800) BP: (73-134)/(39-112) 85/65 (07/14 0800) SpO2:  [86 %-100 %] 100 % (07/14 0739) Weight:  [83 kg-83.6 kg] 83 kg (07/14 0500) Last BM Date: 03/18/19 Mean arterial Pressure 73  Intake/Output:   Intake/Output Summary (Last 24 hours) at 03/19/2019 0845 Last data filed at 03/19/2019 0800 Gross per 24 hour  Intake 336.34 ml  Output 597 ml  Net -260.66 ml     Physical Exam    General: Well appearing this am. NAD.  HEENT: Normal. Neck: Supple, JVP 7-8 cm. Carotids OK.  Cardiac:  Mechanical heart sounds with LVAD hum present. Right PT pulse dopplerable.  Lungs:  CTAB, normal effort.  Abdomen:  NT, ND, no HSM. No bruits or masses. +BS  LVAD exit site: Well-healed and incorporated. Dressing dry and intact. No erythema or drainage. Stabilization device present and accurately applied. Driveline dressing changed daily per sterile technique. Extremities:  Warm and dry. No cyanosis, clubbing, rash. Ulcer right 4th toe.  Neuro:  Alert & oriented x 3. Cranial nerves grossly intact. Moves all 4 extremities w/o difficulty. Affect  pleasant     Telemetry   NSR 80s (personally reviewed)   Labs   Basic Metabolic Panel: Recent Labs  Lab 03/15/19 1807 03/16/19 0633 03/17/19 0443 03/18/19 0304 03/19/19 0308  NA 135 134* 134* 132* 136  K 3.7 3.8 4.9 4.8 4.1  CL 97* 96* 98 96* 101  CO2 24 23 20* 20* 23  GLUCOSE 130* 152* 87 94 179*  BUN 19 35* 51* 67* 44*  CREATININE 5.34* 6.88* 9.06* 10.85* 8.18*  CALCIUM 10.0 9.3 9.4 9.1 8.4*    Liver Function Tests: Recent Labs  Lab 03/15/19 1807  AST 21  ALT 18  ALKPHOS 101  BILITOT 0.7  PROT 7.6  ALBUMIN 3.5   No results for input(s): LIPASE, AMYLASE in the last 168 hours. No results for input(s): AMMONIA in the last 168 hours.  CBC: Recent Labs  Lab 03/15/19 1807 03/16/19 0633 03/17/19 0443 03/18/19 0304 03/19/19 0308  WBC 12.7* 11.7* 15.3* 14.5* 13.3*  NEUTROABS 9.4*  --   --   --   --   HGB 11.6* 10.9* 11.0* 9.6* 8.6*  HCT 35.3* 34.7* 33.9* 30.1* 26.6*  MCV 91.7 94.6 92.4 93.2 93.7  PLT 197 198 198 203 183    INR: Recent Labs  Lab 03/15/19 1807 03/16/19 0935 03/17/19 0443 03/18/19 0304 03/19/19 0308  INR 1.8* 1.9* 1.9* 1.7* 1.6*    Other results:  EKG:    Imaging  No results found.   Medications:     Scheduled Medications: . amiodarone  200 mg Oral Daily  . aspirin EC  81 mg Oral Daily  . atorvastatin  40 mg Oral q1800  . Chlorhexidine Gluconate Cloth  6 each Topical Daily  . Chlorhexidine Gluconate Cloth  6 each Topical Daily  . citalopram  20 mg Oral Daily  . doxercalciferol  3 mcg Intravenous Q M,W,F-HD  . dronabinol  2.5 mg Oral BID AC  . erythromycin  250 mg Oral TID  . gabapentin  100 mg Oral BID  . insulin aspart  0-15 Units Subcutaneous TID WC  . insulin aspart  0-5 Units Subcutaneous QHS  . insulin aspart  7 Units Subcutaneous QPM  . insulin glargine  5 Units Subcutaneous Daily  . metoCLOPramide  10 mg Oral TID  . midodrine  5 mg Oral Q M,W,F  . multivitamin  1 tablet Oral Daily  . pantoprazole  40 mg  Oral BID  . sevelamer carbonate  4,000 mg Oral TID WC  . sodium chloride flush  10-40 mL Intracatheter Q12H  . sodium chloride flush  3 mL Intravenous Q12H  . warfarin  7.5 mg Oral ONCE-1800  . Warfarin - Pharmacist Dosing Inpatient   Does not apply q1800    Infusions: . sodium chloride    . [START ON 03/20/2019] ferric gluconate (FERRLECIT/NULECIT) IV    . heparin 1,200 Units/hr (03/19/19 0800)    PRN Medications: sodium chloride, acetaminophen, docusate sodium, fentaNYL (SUBLIMAZE) injection, hydrALAZINE, labetalol, lidocaine-prilocaine, LORazepam, ondansetron (ZOFRAN) IV, promethazine **OR** promethazine, sevelamer carbonate, sodium chloride flush, sodium chloride flush, traMADol    Assessment/Plan:    1. Right foot pain/PAD: Ulcer on 4th toe,  ischemic due to PAD. Peripheral angiogram 7/13 with 80% right SFA stenosis and occluded right PT.  He had stenting of SFA and PTCA of PT with restoration of flow. Right PT pulse now dopperable.  - Continue heparin gtt until INR > 1.8, continue warfarin.  - Continue ASA 81.   2. Chronic systolic CHF: Ischemic cardiomyopathy. St Jude ICD. NSTEMI in March 2018 with DES to LAD and RCA, complicated by cardiogenic shock, low output requiring milrinone. Echo 8/18 with EF 20-25%, moderate MR. CPX (8/18) with severe functional impairment due to HF. He is s/p HM3 LVAD placement. He had pump thrombosis 6/19 due to [facility] outflow graft kinking. He had pump exchange for another HM3.  - Volume managed at dialysis, had last on Friday and will have later today.  - Continue ASA 81. INR 1.6 today, continue warfarin and can stop heparin gtt when INR > 1.8 (probably tomorrow).   - He has been referred to [facility] for evaluation for heart/kidney transplantand recently saw Dr Mosetta Pigeon. 3. ESRD: Developed peri-operatively after HM3 placement. 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  -He is currently looking  for a living donor. - HD tomorrow.   4. CAD: NSTEMI in 3/18. LHC with 99% ulcerated lesion proximal RCA with left to right collaterals, 95% mid LAD stenosis after mid LAD stent. s/p PCI to RCA and LAD on 11/25/16.  -Continue statin and ASA. 4. h/o DVTs: Factor V Leiden heterozygote. - Anticoagulated.  5. Diabetic gastroparesis: Difficult to manage. He has seen gastroparesis specialist at [facility]. Surprisingly, gastric emptying study was normal. However, electrogastrogram showed poor gastric accomodation/capacity. Therefore, small frequent meals recommended. Recently doing better.   - Continue reglan 5 mg tid/ac and Marinol.  -  Continue Protonix bid.  - He will continue erythromycinindefinitely.  6. DM: Cover with SSI and home glargine.  7. HTN:MAP has been lower with dialysis, now off Bidil and getting midodrine prior to HD.  8. Atrial flutter: Paroxysmal. He has been in NSR recently.  - Continue amiodarone. 9. Left hand tingling and grip weakness: Present since he developed left arm hematoma from failed AV graft. 10. Difficult IV access: Placed IJ.   Probably home after HD tomorrow if INR up to 1.8.   I reviewed the LVAD parameters from today, and compared the results to the patient's prior recorded data.  No programming changes were made.  The LVAD is functioning within specified parameters.  The patient performs LVAD self-test daily.  LVAD interrogation was negative for any significant power changes, alarms or PI events/speed drops.  LVAD equipment check completed and is in good working order.  Back-up equipment present.   LVAD education done on emergency procedures and precautions and reviewed exit site care.  Length of Stay: [place], MD 03/19/2019, 8:45 AM  VAD Team --- VAD ISSUES ONLY--- Pager [phone] (7am - 7am)  Advanced Heart Failure Team  Pager [phone] (M-F; 7a - 4p)  Please contact [facility] for night-coverage after hours (4p -7a ) and  weekends on [email or web address]

## 2019-03-19 NOTE — Progress Notes (Addendum)
  Progress Note    03/19/2019 8:47 AM 1 Day Post-Op  Subjective:  Pain in toes of R foot   Vitals:   03/19/19 0739 03/19/19 0800  BP: (!) 87/68 (!) 85/65  Pulse: 77   Resp: 16 17  Temp: 98.1 F (36.7 C)   SpO2: 100%    Physical Exam: Lungs:  Non labored Incisions:  L groin cath site soft, R PT cath site without hematoma Extremities:  Brisk R PT by doppler Neurologic: A&O  CBC    Component Value Date/Time   WBC 13.3 (H) 03/19/2019 0308   RBC 2.84 (L) 03/19/2019 0308   HGB 8.6 (L) 03/19/2019 0308   HGB 11.7 (L) 09/07/2016 1457   HCT 26.6 (L) 03/19/2019 0308   HCT 37.5 09/07/2016 1457   PLT 183 03/19/2019 0308   PLT 279 09/07/2016 1457   MCV 93.7 03/19/2019 0308   MCV 81 09/07/2016 1457   MCH 30.3 03/19/2019 0308   MCHC 32.3 03/19/2019 0308   RDW 14.8 03/19/2019 0308   RDW 14.9 09/07/2016 1457   LYMPHSABS 1.7 03/15/2019 1807   LYMPHSABS 1.3 09/07/2016 1457   MONOABS 0.7 03/15/2019 1807   EOSABS 0.6 (H) 03/15/2019 1807   EOSABS 0.2 09/07/2016 1457   BASOSABS 0.0 03/15/2019 1807   BASOSABS 0.0 09/07/2016 1457    BMET    Component Value Date/Time   NA 136 03/19/2019 0308   NA 144 05/11/2017 0938   K 4.1 03/19/2019 0308   CL 101 03/19/2019 0308   CO2 23 03/19/2019 0308   GLUCOSE 179 (H) 03/19/2019 0308   BUN 44 (H) 03/19/2019 0308   BUN 68 (H) 05/11/2017 0938   CREATININE 8.18 (H) 03/19/2019 0308   CREATININE 1.63 (H) 11/09/2016 1505   CALCIUM 8.4 (L) 03/19/2019 0308   CALCIUM 8.7 06/27/2017 1152   GFRNONAA 7 (L) 03/19/2019 0308   GFRNONAA 48 (L) 11/09/2016 1505   GFRAA 8 (L) 03/19/2019 0308   GFRAA 55 (L) 11/09/2016 1505    INR    Component Value Date/Time   INR 1.6 (H) 03/19/2019 0308     Intake/Output Summary (Last 24 hours) at 03/19/2019 0847 Last data filed at 03/19/2019 0800 Gross per 24 hour  Intake 336.34 ml  Output 597 ml  Net -260.66 ml     Assessment/Plan:  53 y.o. male is s/p R SFA stent and PT angioplasty 1 Day Post-Op   R  foot warm and well perfused with brisk R PT signal by doppler L groin cath site soft without hematoma R 4th toe wound stable, will follow up in a couple weeks as outpt    Dagoberto Ligas, PA-C [facility] [phone] 03/19/2019 8:47 AM   I have independently interviewed and examined patient and agree with PA assessment and plan above. Patient does not want toe amputation at this time and should be ok to restart coumadin. Will be high risk for amputation.  Bresha Hosack C. Donzetta Matters, MD [facility] [place] Office: [phone] Pager: [phone]

## 2019-03-19 NOTE — Progress Notes (Signed)
Subjective:  No new complaints- INR only 1.6 today -  Had HD yest- removed 600-  Noticed that he only ran 3 hours - refusing his renvela- no phos checked  Objective Vital signs in last 24 hours: Vitals:   03/19/19 0600 03/19/19 0700 03/19/19 0739 03/19/19 0800  BP:  96/84 (!) 87/68 (!) 85/65  Pulse: (!) 54 77 77   Resp:   16 17  Temp:   98.1 F (36.7 C)   TempSrc:   Oral   SpO2: (!) 86% 100% 100%   Weight:      Height:       Weight change: -0.9 kg  Intake/Output Summary (Last 24 hours) at 03/19/2019 0845 Last data filed at 03/19/2019 0800 Gross per 24 hour  Intake 336.34 ml  Output 597 ml  Net -260.66 ml    Dialysis Orders: Center: [place]  on MWF . EDW 84 kg   HD Bath 2 K, 3 CA  Time 4hr 40mn  Heparin NONE. Access R IJ Perm cath      Hec 3 mcg IV/HD  Venofer 50 mg q wkly hd  NO ESA  Last hgb 10.8 ( was last on Mircera  60 mcg 6/03 )      Assessment/ Plan: Pt is a 53y.o. yo male with ESRD and LVAD who was admitted on 03/15/2019 with ischemic right foot   Assessment/Plan: 1. PAD-  S/p arteriogram and angioplasty and stent to right side- also ulcer on right toes 2. [facility]- will run 4 hours  3. Anemia- hgb seems to have dropped with procedure - will give esa tomorrow, already on iron   4. Secondary hyperparathyroidism- cont home hectorol and renvela- refusing renvela- no phos- since going home soon, no change for now   5. HTN/volume- stable and fine- on midodrine 6. LVAD-  Per protocols - in 2[place]7. Dispo- he tells me home once his INR is up -  Maybe tomorrow but pt not willing to go to OP unit at a later time tomorrow- will do HD first shift on Wed    Lashayla Armes A Rossana Molchan    Labs: Basic Metabolic Panel: Recent Labs  Lab 03/17/19 0443 03/18/19 0304 03/19/19 0308  NA 134* 132* 136  K 4.9 4.8 4.1  CL 98 96* 101  CO2 20* 20* 23  GLUCOSE 87 94 179*  BUN 51* 67* 44*  CREATININE 9.06* 10.85* 8.18*  CALCIUM 9.4 9.1 8.4*   Liver  Function Tests: Recent Labs  Lab 03/15/19 1807  AST 21  ALT 18  ALKPHOS 101  BILITOT 0.7  PROT 7.6  ALBUMIN 3.5   No results for input(s): LIPASE, AMYLASE in the last 168 hours. No results for input(s): AMMONIA in the last 168 hours. CBC: Recent Labs  Lab 03/15/19 1807 03/16/19 0633 03/17/19 0443 03/18/19 0304 03/19/19 0308  WBC 12.7* 11.7* 15.3* 14.5* 13.3*  NEUTROABS 9.4*  --   --   --   --   HGB 11.6* 10.9* 11.0* 9.6* 8.6*  HCT 35.3* 34.7* 33.9* 30.1* 26.6*  MCV 91.7 94.6 92.4 93.2 93.7  PLT 197 198 198 203 183   Cardiac Enzymes: No results for input(s): CKTOTAL, CKMB, CKMBINDEX, TROPONINI in the last 168 hours. CBG: Recent Labs  Lab 03/18/19 0612 03/18/19 1140 03/18/19 1618 03/18/19 2215 03/19/19 0637  GLUCAP 86 82 150* 139* 85    Iron Studies: No results for input(s): IRON, TIBC, TRANSFERRIN, FERRITIN in the last 72  hours. Studies/Results: No results found. Medications: Infusions: . sodium chloride    . [START ON 03/20/2019] ferric gluconate (FERRLECIT/NULECIT) IV    . heparin 1,200 Units/hr (03/19/19 0800)    Scheduled Medications: . amiodarone  200 mg Oral Daily  . aspirin EC  81 mg Oral Daily  . atorvastatin  40 mg Oral q1800  . Chlorhexidine Gluconate Cloth  6 each Topical Daily  . Chlorhexidine Gluconate Cloth  6 each Topical Daily  . citalopram  20 mg Oral Daily  . doxercalciferol  3 mcg Intravenous Q M,W,F-HD  . dronabinol  2.5 mg Oral BID AC  . erythromycin  250 mg Oral TID  . gabapentin  100 mg Oral BID  . insulin aspart  0-15 Units Subcutaneous TID WC  . insulin aspart  0-5 Units Subcutaneous QHS  . insulin aspart  7 Units Subcutaneous QPM  . insulin glargine  5 Units Subcutaneous Daily  . metoCLOPramide  10 mg Oral TID  . midodrine  5 mg Oral Q M,W,F  . multivitamin  1 tablet Oral Daily  . pantoprazole  40 mg Oral BID  . sevelamer carbonate  4,000 mg Oral TID WC  . sodium chloride flush  10-40 mL Intracatheter Q12H  . sodium  chloride flush  3 mL Intravenous Q12H  . warfarin  8 mg Oral ONCE-1800  . Warfarin - Pharmacist Dosing Inpatient   Does not apply q1800    have reviewed scheduled and prn medications.  Physical Exam: General: NAD Heart: RRR Lungs: mostly clear Abdomen: soft, non tender Extremities: no edema- right foot cool  Dialysis Access: left sided TDC     03/19/2019,8:45 AM  LOS: 4 days

## 2019-03-20 LAB — CBC
HCT: 26.2 % — ABNORMAL LOW (ref 39.0–52.0)
Hemoglobin: 8.3 g/dL — ABNORMAL LOW (ref 13.0–17.0)
MCH: 29.4 pg (ref 26.0–34.0)
MCHC: 31.7 g/dL (ref 30.0–36.0)
MCV: 92.9 fL (ref 80.0–100.0)
Platelets: 181 10*3/uL (ref 150–400)
RBC: 2.82 MIL/uL — ABNORMAL LOW (ref 4.22–5.81)
RDW: 14.9 % (ref 11.5–15.5)
WBC: 12.9 10*3/uL — ABNORMAL HIGH (ref 4.0–10.5)
nRBC: 0 % (ref 0.0–0.2)

## 2019-03-20 LAB — PROTIME-INR
INR: 1.4 — ABNORMAL HIGH (ref 0.8–1.2)
Prothrombin Time: 17.1 seconds — ABNORMAL HIGH (ref 11.4–15.2)

## 2019-03-20 LAB — BASIC METABOLIC PANEL
Anion gap: 14 (ref 5–15)
BUN: 59 mg/dL — ABNORMAL HIGH (ref 6–20)
CO2: 20 mmol/L — ABNORMAL LOW (ref 22–32)
Calcium: 8.6 mg/dL — ABNORMAL LOW (ref 8.9–10.3)
Chloride: 99 mmol/L (ref 98–111)
Creatinine, Ser: 10.64 mg/dL — ABNORMAL HIGH (ref 0.61–1.24)
GFR calc Af Amer: 6 mL/min — ABNORMAL LOW (ref >60)
GFR calc non Af Amer: 5 mL/min — ABNORMAL LOW (ref >60)
Glucose, Bld: 171 mg/dL — ABNORMAL HIGH (ref 70–99)
Potassium: 4.1 mmol/L (ref 3.5–5.1)
Sodium: 133 mmol/L — ABNORMAL LOW (ref 135–145)

## 2019-03-20 LAB — GLUCOSE, CAPILLARY
Glucose-Capillary: 107 mg/dL — ABNORMAL HIGH (ref 70–99)
Glucose-Capillary: 128 mg/dL — ABNORMAL HIGH (ref 70–99)
Glucose-Capillary: 120 mg/dL — ABNORMAL HIGH (ref 70–99)
Glucose-Capillary: 130 mg/dL — ABNORMAL HIGH (ref 70–99)

## 2019-03-20 LAB — LACTATE DEHYDROGENASE: LDH: 169 U/L (ref 98–192)

## 2019-03-20 LAB — HEPARIN LEVEL (UNFRACTIONATED): Heparin Unfractionated: 0.35 IU/mL (ref 0.30–0.70)

## 2019-03-20 MED ORDER — DOXERCALCIFEROL 4 MCG/2ML IV SOLN
INTRAVENOUS | Status: AC
  Administered 2019-03-20: 3 ug via INTRAVENOUS
  Filled 2019-03-20: qty 2

## 2019-03-20 MED ORDER — HEPARIN SODIUM (PORCINE) 1000 UNIT/ML IJ SOLN
INTRAMUSCULAR | Status: AC
  Filled 2019-03-20: qty 1

## 2019-03-20 MED ORDER — DARBEPOETIN ALFA 150 MCG/0.3ML IJ SOSY
PREFILLED_SYRINGE | INTRAMUSCULAR | Status: AC
  Administered 2019-03-20: 150 ug via INTRAVENOUS
  Filled 2019-03-20: qty 0.3

## 2019-03-20 MED ORDER — WARFARIN SODIUM 7.5 MG PO TABS: 7.5000 mg | ORAL_TABLET | Freq: Once | ORAL | Status: DC

## 2019-03-20 MED ORDER — MIDODRINE HCL 5 MG PO TABS
ORAL_TABLET | ORAL | Status: AC
  Filled 2019-03-20: qty 1

## 2019-03-20 NOTE — Progress Notes (Signed)
Patient ID: [name], male   DOB: [birth year], 53 y.o.   MRN: [id number]   Advanced Heart Failure VAD Team Note  PCP-Cardiologist: No primary care provider on file.   Subjective:    4th toe on right foot still exquisitely painful, hard to bear weight on right foot. Right PT pulse has been dopplerable .   Right foot plain film unremarkable.   Had HD today, no problems.    INR 1.4 on IV heparin gtt and warfarin.   Peripheral angiogram 7/13 with 80% stenosis right SFA and occluded right PT (similar disease left SFA).  He had stent right SFA and angioplasty to PT.   LVAD INTERROGATION:  HeartMate 3 LVAD:   Flow 4.0 liters/min, speed 5500, power 4.4, PI 4.2.  2 PI events/24 hrs.   Objective:    Vital Signs:   Temp:  [97.7 F (36.5 C)-98.4 F (36.9 C)] 98.4 F (36.9 C) (07/15 1140) Pulse Rate:  [54-84] 69 (07/15 1140) Resp:  [11-17] 17 (07/15 1140) BP: (78-139)/(57-117) 98/77 (07/15 1200) SpO2:  [95 %-100 %] 100 % (07/15 1140) Weight:  [86.3 kg-89.5 kg] 86.3 kg (07/15 1140) Last BM Date: 03/18/19 Mean arterial Pressure 85  Intake/Output:   Intake/Output Summary (Last 24 hours) at 03/20/2019 1430 Last data filed at 03/20/2019 1135 Gross per 24 hour  Intake 168 ml  Output 2005 ml  Net -1837 ml     Physical Exam    General: Well appearing this am. NAD.  HEENT: Normal. Neck: Supple, JVP 7-8 cm. Carotids OK.  Cardiac:  Mechanical heart sounds with LVAD hum present.  Lungs:  CTAB, normal effort.  Abdomen:  NT, ND, no HSM. No bruits or masses. +BS  LVAD exit site: Well-healed and incorporated. Dressing dry and intact. No erythema or drainage. Stabilization device present and accurately applied. Driveline dressing changed daily per sterile technique. Extremities:  Warm and dry. No cyanosis, clubbing, rash, or edema.  Ulceration right 4th toe, toe is painful to palpation.  Neuro:  Alert & oriented x 3. Cranial nerves grossly intact. Moves all 4 extremities w/o  difficulty. Affect pleasant    Telemetry   NSR 80s (personally reviewed)   Labs   Basic Metabolic Panel: Recent Labs  Lab 03/16/19 0633 03/17/19 0443 03/18/19 0304 03/19/19 0308 03/20/19 0225  NA 134* 134* 132* 136 133*  K 3.8 4.9 4.8 4.1 4.1  CL 96* 98 96* 101 99  CO2 23 20* 20* 23 20*  GLUCOSE 152* 87 94 179* 171*  BUN 35* 51* 67* 44* 59*  CREATININE 6.88* 9.06* 10.85* 8.18* 10.64*  CALCIUM 9.3 9.4 9.1 8.4* 8.6*    Liver Function Tests: Recent Labs  Lab 03/15/19 1807  AST 21  ALT 18  ALKPHOS 101  BILITOT 0.7  PROT 7.6  ALBUMIN 3.5   No results for input(s): LIPASE, AMYLASE in the last 168 hours. No results for input(s): AMMONIA in the last 168 hours.  CBC: Recent Labs  Lab 03/15/19 1807 03/16/19 0633 03/17/19 0443 03/18/19 0304 03/19/19 0308 03/20/19 0225  WBC 12.7* 11.7* 15.3* 14.5* 13.3* 12.9*  NEUTROABS 9.4*  --   --   --   --   --   HGB 11.6* 10.9* 11.0* 9.6* 8.6* 8.3*  HCT 35.3* 34.7* 33.9* 30.1* 26.6* 26.2*  MCV 91.7 94.6 92.4 93.2 93.7 92.9  PLT 197 198 198 203 183 181    INR: Recent Labs  Lab 03/16/19 0935 03/17/19 0443 03/18/19 0304 03/19/19 0308 03/20/19 0225  INR  1.9* 1.9* 1.7* 1.6* 1.4*    Other results:  EKG:    Imaging   No results found.   Medications:     Scheduled Medications: . amiodarone  200 mg Oral Daily  . aspirin EC  81 mg Oral Daily  . atorvastatin  40 mg Oral q1800  . Chlorhexidine Gluconate Cloth  6 each Topical Daily  . Chlorhexidine Gluconate Cloth  6 each Topical Daily  . Chlorhexidine Gluconate Cloth  6 each Topical Q0600  . citalopram  20 mg Oral Daily  . darbepoetin (ARANESP) injection - DIALYSIS  150 mcg Intravenous Q Wed-HD  . doxercalciferol  3 mcg Intravenous Q M,W,F-HD  . dronabinol  2.5 mg Oral BID AC  . erythromycin  250 mg Oral TID  . gabapentin  100 mg Oral BID  . heparin      . insulin aspart  0-15 Units Subcutaneous TID WC  . insulin aspart  0-5 Units Subcutaneous QHS  .  insulin aspart  7 Units Subcutaneous QPM  . insulin glargine  5 Units Subcutaneous Daily  . metoCLOPramide  10 mg Oral TID  . midodrine  5 mg Oral Q M,W,F  . multivitamin  1 tablet Oral Daily  . pantoprazole  40 mg Oral BID  . sevelamer carbonate  4,000 mg Oral TID WC  . sodium chloride flush  10-40 mL Intracatheter Q12H  . sodium chloride flush  3 mL Intravenous Q12H  . warfarin  7.5 mg Oral ONCE-1800    Infusions: . sodium chloride    . ferric gluconate (FERRLECIT/NULECIT) IV Stopped (03/20/19 1210)  . heparin 1,200 Units/hr (03/19/19 1600)    PRN Medications: sodium chloride, acetaminophen, docusate sodium, fentaNYL (SUBLIMAZE) injection, hydrALAZINE, labetalol, lidocaine-prilocaine, LORazepam, ondansetron (ZOFRAN) IV, promethazine **OR** promethazine, sevelamer carbonate, sodium chloride flush, sodium chloride flush, traMADol    Assessment/Plan:    1. Right foot pain/PAD: Ulcer on 4th toe,  ischemic due to PAD. Peripheral angiogram 7/13 with 80% right SFA stenosis and occluded right PT.  He had stenting of SFA and PTCA of PT with restoration of flow. Right PT pulse now dopperable. However, the toe is still exquisitely painful and he is unable to easily bear weight.  I discussed the situation with Dr. Donzetta Matters, hopefully can save his foot with the PCI he had this week but we are afraid that his toe is not salvageable.  The pain has not improved.   - Plan for surgery tomorrow to amputate right 4th toe.  - Continue heparin gtt, hold warfarin tonight for procedure.  INR 1.4 today.   - Continue ASA 81.   2. Chronic systolic CHF: Ischemic cardiomyopathy. St Jude ICD. NSTEMI in March 2018 with DES to LAD and RCA, complicated by cardiogenic shock, low output requiring milrinone. Echo 8/18 with EF 20-25%, moderate MR. CPX (8/18) with severe functional impairment due to HF. He is s/p HM3 LVAD placement. He had pump thrombosis 6/19 due to [facility] outflow graft kinking. He had pump exchange for  another HM3.  - Volume managed at dialysis, had today without problems.   - Continue ASA 81. INR 1.6 today, continue warfarin and can stop heparin gtt when INR > 1.8 (will hold warfarin tonight for surgery tomorrow).   - He has [facility] for heart/kidney transplantand recently saw Dr Mosetta Pigeon. 3. ESRD: Developed peri-operatively after HM3 placement. 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  -He is currently looking  for a living donor. - HD tomorrow.   4. CAD: NSTEMI in 3/18. LHC with 99% ulcerated lesion proximal RCA with left to right collaterals, 95% mid LAD stenosis after mid LAD stent. s/p PCI to RCA and LAD on 11/25/16.  -Continue statin and ASA. 4. h/o DVTs: Factor V Leiden heterozygote. - Anticoagulated.  5. Diabetic gastroparesis: Difficult to manage. He has seen gastroparesis specialist at [facility]. Surprisingly, gastric emptying study was normal. However, electrogastrogram showed poor gastric accomodation/capacity. Therefore, small frequent meals recommended. Recently doing better.   - Continue reglan 5 mg tid/ac and Marinol.  - Continue Protonix bid.  - He will continue erythromycinindefinitely.  6. DM: Cover with SSI and home glargine.  7. HTN:MAP has been lower with dialysis, now off Bidil and getting midodrine prior to HD.  8. Atrial flutter: Paroxysmal. He has been in NSR recently.  - Continue amiodarone. 9. Left hand tingling and grip weakness: Present since he developed left arm hematoma from failed AV graft. 10. Difficult IV access: Placed IJ.   I reviewed the LVAD parameters from today, and compared the results to the patient's prior recorded data.  No programming changes were made.  The LVAD is functioning within specified parameters.  The patient performs LVAD self-test daily.  LVAD interrogation was negative for any significant power changes, alarms or PI events/speed  drops.  LVAD equipment check completed and is in good working order.  Back-up equipment present.   LVAD education done on emergency procedures and precautions and reviewed exit site care.  Length of Stay: Leeper, MD 03/20/2019, 2:30 PM  VAD Team --- VAD ISSUES ONLY--- Pager [phone] (7am - 7am)  Advanced Heart Failure Team  Pager [phone] (M-F; 7a - 4p)  Please contact [facility] for night-coverage after hours (4p -7a ) and weekends on [email or web address]

## 2019-03-20 NOTE — Procedures (Signed)
Patient was seen on dialysis and the procedure was supervised.  BFR 400  Via TDC BP is  Reading low (lvad).   Patient appears to be tolerating treatment well  Louis Meckel 03/20/2019

## 2019-03-20 NOTE — Plan of Care (Signed)
  Problem: Education: Goal: Knowledge of General Education information will improve Description: Including pain rating scale, medication(s)/side effects and non-pharmacologic comfort measures Outcome: Progressing   Problem: Health Behavior/Discharge Planning: Goal: Ability to manage health-related needs will improve Outcome: Progressing   Problem: Clinical Measurements: Goal: Ability to maintain clinical measurements within normal limits will improve Outcome: Progressing Goal: Diagnostic test results will improve Outcome: Progressing Goal: Respiratory complications will improve Outcome: Progressing Goal: Cardiovascular complication will be avoided Outcome: Progressing

## 2019-03-20 NOTE — Progress Notes (Signed)
ANTICOAGULATION CONSULT NOTE - Follow Up Consult  Pharmacy Consult for heparin Indication: LVAD  Allergies  Allergen Reactions  . Metformin And Related Diarrhea    Patient Measurements: Height: 5' 7"  (170.2 cm)(per chart) Weight: 197 lb 5 oz (89.5 kg) IBW/kg (Calculated) : 66.1 Heparin Dosing Weight: 82.2 kg  Vital Signs: Temp: 98.1 F (36.7 C) (07/15 0711) Temp Source: Oral (07/15 0711) BP: 95/73 (07/15 0711) Pulse Rate: 74 (07/15 0711)  Labs: Recent Labs    03/18/19 0304  03/18/19 2041 03/19/19 0301 03/19/19 0308 03/20/19 0225  HGB 9.6*  --   --   --  8.6* 8.3*  HCT 30.1*  --   --   --  26.6* 26.2*  PLT 203  --   --   --  183 181  LABPROT 19.5*  --   --   --  19.1* 17.1*  INR 1.7*  --   --   --  1.6* 1.4*  HEPARINUNFRC  --    < > 0.34 0.27*  --  0.35  CREATININE 10.85*  --   --   --  8.18* 10.64*   < > = values in this interval not displayed.    Estimated Creatinine Clearance: 8.5 mL/min (A) (by C-G formula based on SCr of 10.64 mg/dL (H)).   Medical History: Past Medical History:  Diagnosis Date  . Angina   . ASCVD (arteriosclerotic cardiovascular disease)    , Anterior infarction 2005, LAD diagonal bifurcation intervention 03/2004  . Automatic implantable cardiac defibrillator -St. Jude's       . Benign neoplasm of colon   . CHF (congestive heart failure) ([place])   . Chronic systolic heart failure ([place])   . Coronary artery disease     Widely patent previously placed stents in the left anterior   . Crohn's disease ([place])   . Deep venous thrombosis (HCC)    Recurrent-on Coumadin  . Dialysis patient ([place])   . Dyspnea   . Gastroparesis   . GERD (gastroesophageal reflux disease)   . High cholesterol   . Hyperlipidemia   . Hypersomnolent    Previous diagnosis of narcolepsy  . Hypertension, essential   . Ischemic cardiomyopathy    Ejection fraction 15-20% catheterization 2010  . Type II or unspecified type diabetes mellitus without mention of  complication, not stated as uncontrolled   . Unspecified gastritis and gastroduodenitis without mention of hemorrhage     Medications:  Scheduled:  . amiodarone  200 mg Oral Daily  . aspirin EC  81 mg Oral Daily  . atorvastatin  40 mg Oral q1800  . Chlorhexidine Gluconate Cloth  6 each Topical Daily  . Chlorhexidine Gluconate Cloth  6 each Topical Daily  . Chlorhexidine Gluconate Cloth  6 each Topical Q0600  . citalopram  20 mg Oral Daily  . darbepoetin (ARANESP) injection - DIALYSIS  150 mcg Intravenous Q Wed-HD  . doxercalciferol  3 mcg Intravenous Q M,W,F-HD  . dronabinol  2.5 mg Oral BID AC  . erythromycin  250 mg Oral TID  . gabapentin  100 mg Oral BID  . insulin aspart  0-15 Units Subcutaneous TID WC  . insulin aspart  0-5 Units Subcutaneous QHS  . insulin aspart  7 Units Subcutaneous QPM  . insulin glargine  5 Units Subcutaneous Daily  . metoCLOPramide  10 mg Oral TID  . midodrine  5 mg Oral Q M,W,F  . multivitamin  1 tablet Oral Daily  . pantoprazole  40 mg Oral BID  .  sevelamer carbonate  4,000 mg Oral TID WC  . sodium chloride flush  10-40 mL Intracatheter Q12H  . sodium chloride flush  3 mL Intravenous Q12H  . Warfarin - Pharmacist Dosing Inpatient   Does not apply q1800   Infusions:  . sodium chloride    . ferric gluconate (FERRLECIT/NULECIT) IV    . heparin 1,200 Units/hr (03/19/19 1600)    Assessment: 89 yoM with HM3 LVAD admitted with R foot pain and concern for ischemic foot now s/p revascularization. Warfarin and heparin resumed post-op. Heparin level therapeutic, INR down to 1.4 despite boosted doses, H/H stable.  **PTA Warfarin Dose = 7.656m Tues/Fri, 566mall other days  Goal of Therapy:  Heparin level 0.3-0.7 units/ml Monitor platelets by anticoagulation protocol: Yes   Plan:  -Continue heparin at 1200 units/hr -Warfarin 7.56m41mO x1 tonight - boosting to facilitate discharge -Daily INR, heparin level, CBC, LDH   MicArrie SenateharmD,  BCPS Clinical Pharmacist [phone] check AMION for all [facility] [place] 03/20/2019

## 2019-03-20 NOTE — Progress Notes (Signed)
1435 As pt admitted with ischemia due to PAD and received stenting of SFA, he is more appropriate for PT consult to assist with ambulation. Graylon Good RN BSN 03/20/2019 2:37 PM

## 2019-03-20 NOTE — TOC Initial Note (Signed)
Transition of Care [facility]) - Initial/Assessment Note    Patient Details  Name: [name] MRN: [id number] Date of Birth: 24-Dec-1965  Transition of Care [facility]) CM/SW Contact:    Zenon Mayo, RN Phone Number: 03/20/2019, 12:48 PM  Clinical Narrative:                 From home with daughter, LVAD patient, dialysis MWF, she states she has no issues in getting her meds, she will have transport at dc.  She has PCP, will need follow up apt.   Expected Discharge Plan: Home/Self Care Barriers to Discharge: No Barriers Identified   Patient Goals and CMS Choice Patient states their goals for this hospitalization and ongoing recovery are:: to recover   Choice offered to / list presented to : NA  Expected Discharge Plan and Services Expected Discharge Plan: Home/Self Care In-house Referral: NA Discharge Planning Services: CM Consult Post Acute Care Choice: NA Living arrangements for the past 2 months: Single Family Home                 DME Arranged: (NA)         HH Arranged: NA          Prior Living Arrangements/Services Living arrangements for the past 2 months: Single Family Home Lives with:: Adult Children Patient language and need for interpreter reviewed:: Yes Do you feel safe going back to the place where you live?: Yes      Need for Family Participation in Patient Care: No (Comment) Care giver support system in place?: Yes (comment)   Criminal Activity/Legal Involvement Pertinent to Current Situation/Hospitalization: No - Comment as needed  Activities of Daily Living Home Assistive Devices/Equipment: Other (Comment)(LVAD) ADL Screening (condition at time of admission) Patient's cognitive ability adequate to safely complete daily activities?: Yes Is the patient deaf or have difficulty hearing?: No Does the patient have difficulty seeing, even when wearing glasses/contacts?: No Does the patient have difficulty concentrating, remembering, or making  decisions?: No Patient able to express need for assistance with ADLs?: Yes Does the patient have difficulty dressing or bathing?: No Independently performs ADLs?: Yes (appropriate for developmental age) Does the patient have difficulty walking or climbing stairs?: No Weakness of Legs: Left Weakness of Arms/Hands: None  Permission Sought/Granted                  Emotional Assessment Appearance:: Appears stated age Attitude/Demeanor/Rapport: Engaged Affect (typically observed): Appropriate Orientation: : Oriented to Self, Oriented to Place, Oriented to  Time, Oriented to Situation Alcohol / Substance Use: Not Applicable Psych Involvement: No (comment)  Admission diagnosis:  ischemia rt foot Patient Active Problem List   Diagnosis Date Noted  . Ischemic pain of right foot 03/15/2019  . Ischemic foot 03/15/2019  . Subtherapeutic international normalized ratio (INR) 08/28/2018  . Elevated LDH 08/22/2018  . Acute vomiting   . Nausea & vomiting 06/11/2018  . Heart failure ([place]) 05/23/2018  . Hypertensive urgency 03/25/2018  . Benign essential HTN   . Crohn's disease with complication ([place])   . Diabetes mellitus type 2 in nonobese (HCC)   . Anemia of chronic disease   . Sinus tachycardia   . Leukocytosis   . Left ventricular assist device (LVAD) complication 50/38/8828  . Left ventricular assist device (LVAD) complication, initial encounter 02/28/2018  . Factor V Leiden carrier ([place]) 01/10/2018  . History of creation of ostomy ([place]) 01/10/2018  . Hypercoagulable state ([place]) 01/10/2018  . ESRD (end stage renal disease) (  [place]) 12/11/2017  . Left ventricular assist device present ([place]) 12/05/2017  . ESRD (end stage renal disease) on dialysis ([place]) 10/29/2017  . Nausea with vomiting 09/12/2017  . Intractable nausea and vomiting 07/31/2017  . 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) ([place]) 07/10/2017  . LVAD (left ventricular assist device) present ([place])   . Palliative  care by specialist   . Chronic systolic CHF (congestive heart failure) ([place]) 06/07/2017  . ACP (advance care planning)   . Encounter for central line placement   . Diarrhea   . Palliative care encounter   . Nausea   . CHF exacerbation ([place]) 04/05/2017  . Diabetic keto-acidosis ([place]) 04/05/2017  . Type 2 diabetes mellitus with complication, with long-term current use of insulin ([place]) 03/08/2017  . ESRD on dialysis ([place]) 01/17/2017  . AKI (acute kidney injury) ([place])   . Non-ST elevation (NSTEMI) myocardial infarction ([place])   . CHF (congestive heart failure) ([place]) 09/02/2016  . Elevated serum creatinine   . Encounter for therapeutic drug monitoring 08/03/2016  . Chest pain 04/26/2012  . SVT (supraventricular tachycardia) ([place]) 04/26/2012  . Gastroparesis 11/18/2011  . Nausea and vomiting in adult 11/17/2011  . Coronary artery disease   . Ischemic cardiomyopathy   . Essential hypertension   . Automatic implantable cardioverter-defibrillator in situ   . Deep venous thrombosis ([place])   . Hypersomnolent   . POLYP, COLON 09/25/2008  . Type 2 diabetes mellitus ([place]) 09/25/2008  . Dyslipidemia, goal LDL below 70 09/25/2008  . GASTRITIS 09/25/2008  . DIVERTICULOSIS, COLON 09/25/2008  . RECTAL MASS 09/25/2008  . History of Crohn's disease 09/05/2006   PCP:  Richarda Osmond, DO Pharmacy[facility]   [id number] [id number]: [phone]: [phone]    Social Determinants of Health (SDOH) Interventions    Readmission Risk Interventions Readmission Risk Prevention Plan 03/20/2019  Transportation Screening Complete  Medication Review ([place] Complete  HRI or HP[place]  SW Recovery Care/Counseling Consult Complete  Palliative Care Screening Not [place]alachia[place] Applicable  Some recent data might be hidden

## 2019-03-20 NOTE — Progress Notes (Signed)
Subjective:  No new complaints- INR only 1.4 today - refusing his renvela- no phos checked  Objective Vital signs in last 24 hours: Vitals:   03/20/19 0800 03/20/19 0830 03/20/19 0900 03/20/19 0930  BP: 107/72 97/63 108/71 (!) 78/65  Pulse: 62 65 60 79  Resp:      Temp:      TempSrc:      SpO2:      Weight:      Height:       Weight change: 5.9 kg  Intake/Output Summary (Last 24 hours) at 03/20/2019 0940 Last data filed at 03/19/2019 1600 Gross per 24 hour  Intake 455.93 ml  Output -  Net 455.93 ml    Dialysis Orders: Center: [facility]  on MWF . EDW 84 kg   HD Bath 2 K, 3 CA  Time 4hr 19mn  Heparin NONE. Access R IJ Perm cath      Hec 3 mcg IV/HD  Venofer 50 mg q wkly hd  NO ESA  Last hgb 10.8 ( was last on Mircera  60 mcg 6/03 )      Assessment/ Plan: Pt is a 53y.o. yo male with ESRD and LVAD who was admitted on 03/15/2019 with ischemic right foot   Assessment/Plan: 1. PAD-  S/p arteriogram and angioplasty and stent to right side- also ulcer on right toes.  no further intervention planned in the short term 2. ESRD - [place] MWF via [place]- due today- will run 4 hours  3. Anemia- hgb seems to have dropped with procedure -  giving esa today, already on iron   4. Secondary hyperparathyroidism- cont home hectorol and renvela- refusing renvela- no phos- since going home soon, no change for now   5. HTN/volume- stable and fine- on midodrine 6. LVAD-  Per protocols - in [place]. Dispo- he tells me home once his INR is up - so far INR has been trending down :(  [facility]   Labs: Basic Metabolic Panel: Recent Labs  Lab 03/18/19 0304 03/19/19 0308 03/20/19 0225  NA 132* 136 133*  K 4.8 4.1 4.1  CL 96* 101 99  CO2 20* 23 20*  GLUCOSE 94 179* 171*  BUN 67* 44* 59*  CREATININE 10.85* 8.18* 10.64*  CALCIUM 9.1 8.4* 8.6*   Liver Function Tests: Recent Labs  Lab 03/15/19 1807  AST 21  ALT 18  ALKPHOS 101  BILITOT 0.7  PROT 7.6  ALBUMIN 3.5   No results for  input(s): LIPASE, AMYLASE in the last 168 hours. No results for input(s): AMMONIA in the last 168 hours. CBC: Recent Labs  Lab 03/15/19 1807 03/16/19 0633 03/17/19 0443 03/18/19 0304 03/19/19 0308 03/20/19 0225  WBC 12.7* 11.7* 15.3* 14.5* 13.3* 12.9*  NEUTROABS 9.4*  --   --   --   --   --   HGB 11.6* 10.9* 11.0* 9.6* 8.6* 8.3*  HCT 35.3* 34.7* 33.9* 30.1* 26.6* 26.2*  MCV 91.7 94.6 92.4 93.2 93.7 92.9  PLT 197 198 198 203 183 181   Cardiac Enzymes: No results for input(s): CKTOTAL, CKMB, CKMBINDEX, TROPONINI in the last 168 hours. CBG: Recent Labs  Lab 03/19/19 0637 03/19/19 1131 03/19/19 1543 03/19/19 2329 03/20/19 0607  GLUCAP 85 158* 119* 223* 107*    Iron Studies: No results for input(s): IRON, TIBC, TRANSFERRIN, FERRITIN in the last 72 hours. Studies/Results: No results found. Medications: Infusions: . sodium chloride    . ferric gluconate (FERRLECIT/NULECIT) IV    .  heparin 1,200 Units/hr (03/19/19 1600)    Scheduled Medications: . amiodarone  200 mg Oral Daily  . aspirin EC  81 mg Oral Daily  . atorvastatin  40 mg Oral q1800  . Chlorhexidine Gluconate Cloth  6 each Topical Daily  . Chlorhexidine Gluconate Cloth  6 each Topical Daily  . Chlorhexidine Gluconate Cloth  6 each Topical Q0600  . citalopram  20 mg Oral Daily  . darbepoetin (ARANESP) injection - DIALYSIS  150 mcg Intravenous Q Wed-HD  . doxercalciferol  3 mcg Intravenous Q M,W,F-HD  . dronabinol  2.5 mg Oral BID AC  . erythromycin  250 mg Oral TID  . gabapentin  100 mg Oral BID  . insulin aspart  0-15 Units Subcutaneous TID WC  . insulin aspart  0-5 Units Subcutaneous QHS  . insulin aspart  7 Units Subcutaneous QPM  . insulin glargine  5 Units Subcutaneous Daily  . metoCLOPramide  10 mg Oral TID  . midodrine  5 mg Oral Q M,W,F  . multivitamin  1 tablet Oral Daily  . pantoprazole  40 mg Oral BID  . sevelamer carbonate  4,000 mg Oral TID WC  . sodium chloride flush  10-40 mL Intracatheter  Q12H  . sodium chloride flush  3 mL Intravenous Q12H  . warfarin  7.5 mg Oral ONCE-1800  . Warfarin - Pharmacist Dosing Inpatient   Does not apply q1800    have reviewed scheduled and prn medications.  Physical Exam: General: NAD Heart: RRR Lungs: mostly clear Abdomen: soft, non tender Extremities: no edema- right foot slightly cool  Dialysis Access: left sided TDC     03/20/2019,9:40 AM  LOS: 5 days

## 2019-03-20 NOTE — Anesthesia Preprocedure Evaluation (Addendum)
Anesthesia Evaluation  Patient identified by MRN, date of birth, ID band Patient awake    Reviewed: Allergy & Precautions, NPO status , Patient's Chart, lab work & pertinent test results  History of Anesthesia Complications Negative for: history of anesthetic complications  Airway Mallampati: II  TM Distance: >3 FB Neck ROM: Full    Dental  (+) Edentulous Upper, Edentulous Lower   Pulmonary shortness of breath,  03/15/2019 SARS coronavirus NEG   breath sounds clear to auscultation       Cardiovascular hypertension, (-) angina+ CAD, + Cardiac Stents, + Peripheral Vascular Disease, +CHF and + DVT  + dysrhythmias Atrial Fibrillation + Cardiac Defibrillator  Rhythm:Regular Rate:Normal  HeartMate II LVAD:  Flow 3.5 liters/min, speed 5600, power 4.3, PI 7.1.  Multiple PI events and 1 low flow with HD today.  '19 ECHO: EF 30-35%, mild-mod TR   Neuro/Psych negative neurological ROS     GI/Hepatic GERD  Medicated,Crohn's   Endo/Other  diabetes, Insulin Dependent  Renal/GU ESRF and DialysisRenal disease     Musculoskeletal   Abdominal   Peds  Hematology  (+) Blood dyscrasia (Hb 8.3), anemia , coumadin   Anesthesia Other Findings   Reproductive/Obstetrics                            Anesthesia Physical Anesthesia Plan  ASA: IV  Anesthesia Plan: Regional   Post-op Pain Management:    Induction:   PONV Risk Score and Plan: 1 and Ondansetron and Treatment may vary due to age or medical condition  Airway Management Planned: Natural Airway and Simple Face Mask  Additional Equipment:   Intra-op Plan:   Post-operative Plan:   Informed Consent: I have reviewed the patients History and Physical, chart, labs and discussed the procedure including the risks, benefits and alternatives for the proposed anesthesia with the patient or authorized representative who has indicated his/her understanding and  acceptance.       Plan Discussed with: CRNA and Surgeon  Anesthesia Plan Comments: (Plan routine monitors, ankle block)       Anesthesia Quick Evaluation

## 2019-03-21 ENCOUNTER — Inpatient Hospital Stay (HOSPITAL_COMMUNITY): Payer: BC Managed Care – PPO | Admitting: Certified Registered Nurse Anesthetist

## 2019-03-21 ENCOUNTER — Encounter (HOSPITAL_COMMUNITY)
Admission: AD | Disposition: A | Discharge status: Home / Self Care | Payer: Self-pay | Source: Ambulatory Visit | Attending: Cardiology

## 2019-03-21 ENCOUNTER — Encounter (HOSPITAL_COMMUNITY): Payer: Self-pay | Admitting: Certified Registered Nurse Anesthetist

## 2019-03-21 DIAGNOSIS — I96 Gangrene, not elsewhere classified: Secondary | ICD-10-CM

## 2019-03-21 HISTORY — PX: AMPUTATION: SHX166

## 2019-03-21 LAB — GLUCOSE, CAPILLARY
Glucose-Capillary: 113 mg/dL — ABNORMAL HIGH (ref 70–99)
Glucose-Capillary: 99 mg/dL (ref 70–99)
Glucose-Capillary: 157 mg/dL — ABNORMAL HIGH (ref 70–99)
Glucose-Capillary: 99 mg/dL (ref 70–99)
Glucose-Capillary: 59 mg/dL — ABNORMAL LOW (ref 70–99)
Glucose-Capillary: 92 mg/dL (ref 70–99)

## 2019-03-21 LAB — CBC
HCT: 27 % — ABNORMAL LOW (ref 39.0–52.0)
Hemoglobin: 8.6 g/dL — ABNORMAL LOW (ref 13.0–17.0)
MCH: 29.6 pg (ref 26.0–34.0)
MCHC: 31.9 g/dL (ref 30.0–36.0)
MCV: 92.8 fL (ref 80.0–100.0)
Platelets: 219 10*3/uL (ref 150–400)
RBC: 2.91 MIL/uL — ABNORMAL LOW (ref 4.22–5.81)
RDW: 14.7 % (ref 11.5–15.5)
WBC: 14.6 10*3/uL — ABNORMAL HIGH (ref 4.0–10.5)
nRBC: 0 % (ref 0.0–0.2)

## 2019-03-21 LAB — PROTIME-INR
INR: 1.9 — ABNORMAL HIGH (ref 0.8–1.2)
Prothrombin Time: 21.6 seconds — ABNORMAL HIGH (ref 11.4–15.2)

## 2019-03-21 LAB — LACTATE DEHYDROGENASE: LDH: 180 U/L (ref 98–192)

## 2019-03-21 LAB — PHOSPHORUS: Phosphorus: 5.4 mg/dL — ABNORMAL HIGH (ref 2.5–4.6)

## 2019-03-21 LAB — HEPARIN LEVEL (UNFRACTIONATED): Heparin Unfractionated: 0.35 IU/mL (ref 0.30–0.70)

## 2019-03-21 SURGERY — AMPUTATION DIGIT
Anesthesia: Regional | Site: Toe | Laterality: Right

## 2019-03-21 MED ORDER — DEXMEDETOMIDINE HCL 200 MCG/2ML IV SOLN
INTRAVENOUS | Status: DC | PRN
  Administered 2019-03-21 (×2): 4 ug via INTRAVENOUS

## 2019-03-21 MED ORDER — ONDANSETRON HCL 4 MG/2ML IJ SOLN
INTRAMUSCULAR | Status: AC
  Filled 2019-03-21: qty 2

## 2019-03-21 MED ORDER — WARFARIN SODIUM 7.5 MG PO TABS
7.5000 mg | ORAL_TABLET | Freq: Once | ORAL | Status: AC
  Administered 2019-03-21: 7.5 mg via ORAL
  Filled 2019-03-21: qty 1

## 2019-03-21 MED ORDER — MIDAZOLAM HCL 2 MG/2ML IJ SOLN
INTRAMUSCULAR | Status: AC
  Filled 2019-03-21: qty 2

## 2019-03-21 MED ORDER — ONDANSETRON HCL 4 MG/2ML IJ SOLN
INTRAMUSCULAR | Status: DC | PRN
  Administered 2019-03-21: 4 mg via INTRAVENOUS

## 2019-03-21 MED ORDER — PROPOFOL 10 MG/ML IV BOLUS
INTRAVENOUS | Status: AC
  Filled 2019-03-21: qty 20

## 2019-03-21 MED ORDER — LACTATED RINGERS IV SOLN
INTRAVENOUS | Status: DC | PRN
  Administered 2019-03-21: 07:00:00 via INTRAVENOUS

## 2019-03-21 MED ORDER — ROPIVACAINE HCL 5 MG/ML IJ SOLN
INTRAMUSCULAR | Status: DC | PRN
  Administered 2019-03-21: 40 mL via PERINEURAL

## 2019-03-21 MED ORDER — WARFARIN - PHARMACIST DOSING INPATIENT
Freq: Every day | Status: DC
  Administered 2019-03-21: 1
  Administered 2019-03-22: 17:00:00

## 2019-03-21 MED ORDER — CHLORHEXIDINE GLUCONATE CLOTH 2 % EX PADS
6.0000 | MEDICATED_PAD | Freq: Every day | CUTANEOUS | Status: DC
  Administered 2019-03-22: 6 via TOPICAL

## 2019-03-21 MED ORDER — CEFAZOLIN SODIUM-DEXTROSE 2-3 GM-%(50ML) IV SOLR
INTRAVENOUS | Status: DC | PRN
  Administered 2019-03-21: 2 g via INTRAVENOUS

## 2019-03-21 MED ORDER — WARFARIN SODIUM 3 MG PO TABS: 6.0000 mg | ORAL_TABLET | Freq: Once | ORAL | Status: DC

## 2019-03-21 MED ORDER — MIDAZOLAM HCL 5 MG/5ML IJ SOLN
INTRAMUSCULAR | Status: DC | PRN
  Administered 2019-03-21: 1 mg via INTRAVENOUS

## 2019-03-21 MED ORDER — PHENYLEPHRINE 40 MCG/ML (10ML) SYRINGE FOR IV PUSH (FOR BLOOD PRESSURE SUPPORT)
PREFILLED_SYRINGE | INTRAVENOUS | Status: AC
  Filled 2019-03-21: qty 10

## 2019-03-21 MED ORDER — 0.9 % SODIUM CHLORIDE (POUR BTL) OPTIME
TOPICAL | Status: DC | PRN
  Administered 2019-03-21: 1000 mL

## 2019-03-21 MED ORDER — FENTANYL CITRATE (PF) 250 MCG/5ML IJ SOLN
INTRAMUSCULAR | Status: AC
  Filled 2019-03-21: qty 5

## 2019-03-21 MED ORDER — FENTANYL CITRATE (PF) 100 MCG/2ML IJ SOLN
INTRAMUSCULAR | Status: DC | PRN
  Administered 2019-03-21: 25 ug via INTRAVENOUS

## 2019-03-21 SURGICAL SUPPLY — 31 items
BLADE AVERAGE 25X9 (BLADE) IMPLANT
BLADE SAW SGTL 81X20 HD (BLADE) IMPLANT
BNDG ELASTIC 4X5.8 VLCR STR LF (GAUZE/BANDAGES/DRESSINGS) ×2 IMPLANT
BNDG GAUZE ELAST 4 BULKY (GAUZE/BANDAGES/DRESSINGS) ×2 IMPLANT
CANISTER SUCT 3000ML PPV (MISCELLANEOUS) ×2 IMPLANT
COVER SURGICAL LIGHT HANDLE (MISCELLANEOUS) ×2 IMPLANT
COVER WAND RF STERILE (DRAPES) ×2 IMPLANT
DRAPE EXTREMITY T 121X128X90 (DISPOSABLE) ×2 IMPLANT
DRAPE HALF SHEET 40X57 (DRAPES) ×2 IMPLANT
DRSG EMULSION OIL 3X3 NADH (GAUZE/BANDAGES/DRESSINGS) ×1 IMPLANT
ELECT REM PT RETURN 9FT ADLT (ELECTROSURGICAL) ×2
ELECTRODE REM PT RTRN 9FT ADLT (ELECTROSURGICAL) ×1 IMPLANT
GAUZE SPONGE 4X4 12PLY STRL (GAUZE/BANDAGES/DRESSINGS) ×2 IMPLANT
GAUZE SPONGE 4X4 12PLY STRL LF (GAUZE/BANDAGES/DRESSINGS) ×1 IMPLANT
GLOVE BIO SURGEON STRL SZ7.5 (GLOVE) ×2 IMPLANT
GOWN STRL REUS W/ TWL LRG LVL3 (GOWN DISPOSABLE) ×2 IMPLANT
GOWN STRL REUS W/ TWL XL LVL3 (GOWN DISPOSABLE) ×1 IMPLANT
GOWN STRL REUS W/TWL LRG LVL3 (GOWN DISPOSABLE) ×4
GOWN STRL REUS W/TWL XL LVL3 (GOWN DISPOSABLE) ×2
KIT BASIN OR (CUSTOM PROCEDURE TRAY) ×2 IMPLANT
KIT TURNOVER KIT B (KITS) ×2 IMPLANT
NDL HYPO 25GX1X1/2 BEV (NEEDLE) IMPLANT
NEEDLE HYPO 25GX1X1/2 BEV (NEEDLE) IMPLANT
NS IRRIG 1000ML POUR BTL (IV SOLUTION) ×2 IMPLANT
PACK GENERAL/GYN (CUSTOM PROCEDURE TRAY) ×2 IMPLANT
PAD ARMBOARD 7.5X6 YLW CONV (MISCELLANEOUS) ×4 IMPLANT
SUT ETHILON 3 0 PS 1 (SUTURE) ×2 IMPLANT
SYR CONTROL 10ML LL (SYRINGE) IMPLANT
TOWEL GREEN STERILE (TOWEL DISPOSABLE) ×4 IMPLANT
UNDERPAD 30X30 (UNDERPADS AND DIAPERS) ×2 IMPLANT
WATER STERILE IRR 1000ML POUR (IV SOLUTION) ×2 IMPLANT

## 2019-03-21 NOTE — Anesthesia Procedure Notes (Signed)
Anesthesia Regional Block: Ankle block   Pre-Anesthetic Checklist: ,, timeout performed, Correct Patient, Correct Site, Correct Laterality, Correct Procedure, Correct Position, site marked, Risks and benefits discussed,  Surgical consent,  Pre-op evaluation,  At surgeon's request and post-op pain management  Laterality: Right and Lower  Prep: chloraprep       Needles:  Injection technique: Single-shot  Needle Type: Quincke     Needle Length: 4cm  Needle Gauge: 25     Additional Needles:   (perineural infiltration)  Narrative:  Start time: 03/21/2019 7:02 AM End time: 03/21/2019 7:11 AM Injection made incrementally with aspirations every 5 mL.  Performed by: Personally  Anesthesiologist: Annye Asa, MD  Additional Notes: Pt identified in Holding room.  Monitors applied. Working IV access confirmed. Sterile prep R ankle.  #25ga local infiltration around deep and sup peroneal, saph, post tib, and sural nerves.  Total 35cc 0.5% Ropivacaine injected incrementally after negative test dose.  Patient asymptomatic, VSS, no heme aspirated, tolerated well.  Jenita Seashore, MD

## 2019-03-21 NOTE — Transfer of Care (Signed)
Immediate Anesthesia Transfer of Care Note  Patient: [name]  Procedure(s) Performed: AMPUTATION DIGIT RIGHT FOURTH TOE (Right Toe)  Patient Location: PACU  Anesthesia Type:MAC combined with regional for post-op pain  Level of Consciousness: awake, alert  and oriented  Airway & Oxygen Therapy: Patient Spontanous Breathing  Post-op Assessment: Report given to RN and Post -op Vital signs reviewed and stable  Post vital signs: Reviewed and stable  Last Vitals:  Vitals Value Taken Time  BP 95/77 03/21/19 0805  Temp    Pulse 69 03/21/19 0804  Resp 10 03/21/19 0805  SpO2 97 % 03/21/19 0804  Vitals shown include unvalidated device data.  Last Pain:  Vitals:   03/21/19 0255  TempSrc: Oral  PainSc: 0-No pain      Patients Stated Pain Goal: 0 (22/02/54 2706)  Complications: No apparent anesthesia complications

## 2019-03-21 NOTE — Plan of Care (Signed)

## 2019-03-21 NOTE — Anesthesia Postprocedure Evaluation (Signed)
Anesthesia Post Note  Patient: Adalbert E Kahre  Procedure(s) Performed: AMPUTATION DIGIT RIGHT FOURTH TOE (Right Toe)     Patient location during evaluation: PACU Anesthesia Type: Regional Level of consciousness: awake and alert, oriented and patient cooperative Pain management: pain level controlled Vital Signs Assessment: post-procedure vital signs reviewed and stable Respiratory status: spontaneous breathing, nonlabored ventilation, respiratory function stable and patient connected to nasal cannula oxygen Cardiovascular status: blood pressure returned to baseline and stable Postop Assessment: no apparent nausea or vomiting Anesthetic complications: no    Last Vitals:  Vitals:   03/21/19 1130 03/21/19 1215  BP: 106/81 99/84  Pulse: 66 71  Resp: (!) 9 14  Temp:  36.6 C  SpO2: 99% 98%    Last Pain:  Vitals:   03/21/19 1215  TempSrc: Oral  PainSc: 0-No pain                 Rainer Mounce,E. Ronan Dion

## 2019-03-21 NOTE — Evaluation (Signed)
Physical Therapy Evaluation Patient Details Name: [name] MRN: [id number] DOB: 1965/10/20 Today's Date: 03/21/2019   History of Present Illness  Pt adm with rt foot pain and found to have gangrene. Underwent rt 4th toe amputation on 7/16. PMH - LVAD 10/18 with emergent pump exchange 6/19, ESRD on HD, CHF, CAD, DM, cardiomyopathy.  Clinical Impression  Pt admitted with above diagnosis and presents to PT with functional limitations due to deficits listed below (See PT problem list). Pt needs skilled PT to maximize independence and safety to allow discharge to home with family. Expect good progress. Recommend post-op shoe for rt foot.      Follow Up Recommendations No PT follow up    Equipment Recommendations  None recommended by PT    Recommendations for Other Services       Precautions / Restrictions Precautions Precautions: Fall Precaution Comments: Asked MD for post op shoe for rt foot  Restrictions Weight Bearing Restrictions: No Other Position/Activity Restrictions: Assume pt to weight bear on rt heel       Mobility  Bed Mobility Overal bed mobility: Modified Independent                Transfers Overall transfer level: Needs assistance Equipment used: Rolling walker (2 wheeled) Transfers: Sit to/from Stand Sit to Stand: Min guard         General transfer comment: Assist for safety and verbal cues for hand placement  Ambulation/Gait Ambulation/Gait assistance: Min guard Gait Distance (Feet): 20 Feet Assistive device: Rolling walker (2 wheeled) Gait Pattern/deviations: Step-to pattern;Decreased stance time - right;Decreased weight shift to right Gait velocity: decr Gait velocity interpretation: <1.31 ft/sec, indicative of household ambulator General Gait Details: Assist for safety and lines. Pt weight bearing through rt heel  Stairs            Wheelchair Mobility    Modified Rankin (Stroke Patients Only)       Balance Overall  balance assessment: Mild deficits observed, not formally tested                                           Pertinent Vitals/Pain Pain Assessment: 0-10 Pain Score: 7  Pain Location: rt foot Pain Descriptors / Indicators: Grimacing Pain Intervention(s): Limited activity within patient's tolerance;Monitored during session;Repositioned;Patient requesting pain meds-RN notified    Home Living Family/patient expects to be discharged to:: Private residence Living Arrangements: Children Available Help at Discharge: Family;Available 24 hours/day Type of Home: House Home Access: Stairs to enter Entrance Stairs-Rails: None Entrance Stairs-Number of Steps: 2 Home Layout: Multi-level;Able to live on main level with bedroom/bathroom Home Equipment: Kasandra Knudsen - single point;Walker - 4 wheels      Prior Function Level of Independence: Independent with assistive device(s)         Comments: uses cane     Hand Dominance   Dominant Hand: Right    Extremity/Trunk Assessment   Upper Extremity Assessment Upper Extremity Assessment: Overall WFL for tasks assessed    Lower Extremity Assessment Lower Extremity Assessment: RLE deficits/detail RLE Deficits / Details: toe amputation and limited by pain       Communication   Communication: No difficulties  Cognition Arousal/Alertness: Awake/alert Behavior During Therapy: WFL for tasks assessed/performed Overall Cognitive Status: Within Functional Limits for tasks assessed  General Comments      Exercises     Assessment/Plan    PT Assessment Patient needs continued PT services  PT Problem List Decreased strength;Decreased activity tolerance;Decreased balance;Decreased mobility;Decreased knowledge of use of DME       PT Treatment Interventions DME instruction;Gait training;Stair training;Functional mobility training;Therapeutic activities;Therapeutic  exercise;Patient/family education    PT Goals (Current goals can be found in the Care Plan section)  Acute Rehab PT Goals Patient Stated Goal: return home PT Goal Formulation: With patient Time For Goal Achievement: 04/04/19 Potential to Achieve Goals: Good    Frequency Min 3X/week   Barriers to discharge Inaccessible home environment stairs to enter    Co-evaluation               AM-PAC PT "6 Clicks" Mobility  Outcome Measure Help needed turning from your back to your side while in a flat bed without using bedrails?: None Help needed moving from lying on your back to sitting on the side of a flat bed without using bedrails?: None Help needed moving to and from a bed to a chair (including a wheelchair)?: A Little Help needed standing up from a chair using your arms (e.g., wheelchair or bedside chair)?: A Little Help needed to walk in hospital room?: A Little Help needed climbing 3-5 steps with a railing? : A Little 6 Click Score: 20    End of Session   Activity Tolerance: Patient limited by pain Patient left: in bed;with call bell/phone within reach Nurse Communication: Mobility status PT Visit Diagnosis: Other abnormalities of gait and mobility (R26.89);Pain Pain - Right/Left: Right Pain - part of body: Ankle and joints of foot    Time: 2111-7356 PT Time Calculation (min) (ACUTE ONLY): 17 min   Charges:   PT Evaluation $PT Eval Moderate Complexity: [place] Pager [phone] Office [place] 03/21/2019, 5:14 PM

## 2019-03-21 NOTE — Progress Notes (Addendum)
ANTICOAGULATION CONSULT NOTE - Follow Up Consult  Pharmacy Consult for heparin Indication: LVAD  Allergies  Allergen Reactions  . Metformin And Related Diarrhea    Patient Measurements: Height: 5' 7"  (170.2 cm)(per chart) Weight: 193 lb 9 oz (87.8 kg) IBW/kg (Calculated) : 66.1 Heparin Dosing Weight: 82.2 kg  Vital Signs: Temp: 97 F (36.1 C) (07/16 0827) Temp Source: Oral (07/16 0255) BP: 95/67 (07/16 0822) Pulse Rate: 67 (07/16 0826)  Labs: Recent Labs    03/19/19 0301 03/19/19 0308 03/20/19 0225 03/21/19 0236 03/21/19 0533  HGB  --  8.6* 8.3*  --   --   HCT  --  26.6* 26.2*  --   --   PLT  --  183 181  --   --   LABPROT  --  19.1* 17.1* 21.6*  --   INR  --  1.6* 1.4* 1.9*  --   HEPARINUNFRC 0.27*  --  0.35 0.35  --   CREATININE  --  8.18* 10.64*  --  7.07*    Estimated Creatinine Clearance: 12.6 mL/min (A) (by C-G formula based on SCr of 7.07 mg/dL (H)).   Medical History: Past Medical History:  Diagnosis Date  . Angina   . ASCVD (arteriosclerotic cardiovascular disease)    , Anterior infarction 2005, LAD diagonal bifurcation intervention 03/2004  . Automatic implantable cardiac defibrillator -St[employer] Jude's       . Benign neoplasm of colon   . CHF (congestive heart failure) ([place])   . Chronic systolic heart failure ([place])   . Coronary artery disease     Widely patent previously placed stents in the left anterior   . Crohn's disease ([place])   . Deep venous thrombosis ([facility])    Recurrent-on Coumadin  . Dialysis patient ([place])   . Dyspnea   . Gastroparesis   . GERD (gastroesophageal reflux disease)   . High cholesterol   . Hyperlipidemia   . Hypersomnolent    Previous diagnosis of narcolepsy  . Hypertension, essential   . Ischemic cardiomyopathy    Ejection fraction 15-20% catheterization 2010  . Type II or unspecified type diabetes mellitus without mention of complication, not stated as uncontrolled   . Unspecified gastritis and gastroduodenitis without  mention of hemorrhage     Medications:  Scheduled:  . amiodarone  200 mg Oral Daily  . aspirin EC  81 mg Oral Daily  . atorvastatin  40 mg Oral q1800  . Chlorhexidine Gluconate Cloth  6 each Topical Daily  . Chlorhexidine Gluconate Cloth  6 each Topical Daily  . Chlorhexidine Gluconate Cloth  6 each Topical Q0600  . citalopram  20 mg Oral Daily  . darbepoetin (ARANESP) injection - DIALYSIS  150 mcg Intravenous Q Wed-HD  . doxercalciferol  3 mcg Intravenous Q M,W,F-HD  . dronabinol  2.5 mg Oral BID AC  . erythromycin  250 mg Oral TID  . gabapentin  100 mg Oral BID  . insulin aspart  0-15 Units Subcutaneous TID WC  . insulin aspart  0-5 Units Subcutaneous QHS  . insulin aspart  7 Units Subcutaneous QPM  . insulin glargine  5 Units Subcutaneous Daily  . metoCLOPramide  10 mg Oral TID  . midodrine  5 mg Oral Q M,W,F  . multivitamin  1 tablet Oral Daily  . pantoprazole  40 mg Oral BID  . sevelamer carbonate  4,000 mg Oral TID WC  . sodium chloride flush  10-40 mL Intracatheter Q12H  . sodium chloride flush  3 mL  Intravenous Q12H   Infusions:  . sodium chloride    . ferric gluconate (FERRLECIT/NULECIT) IV Stopped (03/20/19 1210)    Assessment: 10 yoM with HM3 LVAD admitted with R foot pain and concern for ischemic foot now s/p revascularization. Warfarin and heparin resumed post-op and subsequently held for R toe amputation on 7/16.  INR this morning 1.9, stop heparin, LDH stable.  **PTA Warfarin Dose = 7.60m Tues/Fri, 5560mall other days  Goal of Therapy:  Heparin level 0.3-0.7 units/ml Monitor platelets by anticoagulation protocol: Yes   Plan:  -Stop heparin drip -Will give boosted dose of 7.60m760mO x1 tonight to prevent INR drop tomorrow with held dose yesterday evening -Daily INR, CBC, LDH   MicArrie SenateharmD, BCPS Clinical Pharmacist [phone] check AMION for all [facility] [place] 03/21/2019

## 2019-03-21 NOTE — Progress Notes (Signed)
VAD Coordinator Procedure Note:   VAD Coordinator met patient in holding room 30. Handoff from [facility] bedside nurse. Pt undergoing right 4th toe amputation today per Dr. Donzetta Matters. Hemodynamics and VAD parameters monitored by myself and anesthesia throughout the procedure. Blood pressures were obtained with automatic cuff on left arm and correlated with Doppler MAP.    Time: Doppler Auto  BP Flow PI Power Speed  Pre-procedure:           7:00 102 131/90 (102) 3.7 5.1 4.5 5500           Secation Induction:          7:30  106/77 (88) 3.9 4.6 4.3    7:45  106/68 (81) 4.1 3.8 4.4    7:55  99/82 (89) 4.1 3.7 4.4   Recovery Area:          8:05  9/77 (84) 4.1 3.5 4.5     Patient tolerated the procedure well. VAD Coordinator accompanied and remained with patient in recovery area.    Patient Disposition: [place] RN, [place] Coordinator [phone]

## 2019-03-21 NOTE — Progress Notes (Signed)
  Progress Note    03/21/2019 7:16 AM Day of Surgery  Subjective: No overnight issues  Vitals:   03/21/19 0001 03/21/19 0255  BP: 98/75 103/88  Pulse:  76  Resp: 14 16  Temp: 98.4 F (36.9 C) 98.2 F (36.8 C)  SpO2: 100%     Physical Exam: Awake alert oriented Right fourth toe gangrenous changes exquisitely tender to palpation Right foot is warm  CBC    Component Value Date/Time   WBC 12.9 (H) 03/20/2019 0225   RBC 2.82 (L) 03/20/2019 0225   HGB 8.3 (L) 03/20/2019 0225   HGB 11.7 (L) 09/07/2016 1457   HCT 26.2 (L) 03/20/2019 0225   HCT 37.5 09/07/2016 1457   PLT 181 03/20/2019 0225   PLT 279 09/07/2016 1457   MCV 92.9 03/20/2019 0225   MCV 81 09/07/2016 1457   MCH 29.4 03/20/2019 0225   MCHC 31.7 03/20/2019 0225   RDW 14.9 03/20/2019 0225   RDW 14.9 09/07/2016 1457   LYMPHSABS 1.7 03/15/2019 1807   LYMPHSABS 1.3 09/07/2016 1457   MONOABS 0.7 03/15/2019 1807   EOSABS 0.6 (H) 03/15/2019 1807   EOSABS 0.2 09/07/2016 1457   BASOSABS 0.0 03/15/2019 1807   BASOSABS 0.0 09/07/2016 1457    BMET    Component Value Date/Time   NA 133 (L) 03/21/2019 0533   NA 144 05/11/2017 0938   K 3.7 03/21/2019 0533   CL 95 (L) 03/21/2019 0533   CO2 23 03/21/2019 0533   GLUCOSE 119 (H) 03/21/2019 0533   BUN 32 (H) 03/21/2019 0533   BUN 68 (H) 05/11/2017 0938   CREATININE 7.07 (H) 03/21/2019 0533   CREATININE 1.63 (H) 11/09/2016 1505   CALCIUM 8.6 (L) 03/21/2019 0533   CALCIUM 8.7 06/27/2017 1152   GFRNONAA 8 (L) 03/21/2019 0533   GFRNONAA 48 (L) 11/09/2016 1505   GFRAA 9 (L) 03/21/2019 0533   GFRAA 55 (L) 11/09/2016 1505    INR    Component Value Date/Time   INR 1.9 (H) 03/21/2019 0236     Intake/Output Summary (Last 24 hours) at 03/21/2019 0716 Last data filed at 03/20/2019 1740 Gross per 24 hour  Intake 240 ml  Output 2005 ml  Net -1765 ml     Assessment/plan:  53 y.o. male is s/p revascularization right posterior tibial artery stent of his right SFA.   Has gangrenous changes right fourth toe.  We will plan for right fourth toe amputation today in the OR.   Jonnette Nuon C. Donzetta Matters, MD [facility] Office: [phone] Pager: [phone]  03/21/2019 7:16 AM

## 2019-03-21 NOTE — Progress Notes (Signed)
Patient ID: [name], male   DOB: September 16, 1965, 53 y.o.   MRN: [id number]   Advanced Heart Failure VAD Team Note  PCP-Cardiologist: No primary care provider on file.   Subjective:    Right foot feels much better this morning s/p right 4th toe amputation.    INR 1.9 on IV heparin gtt and warfarin.   Peripheral angiogram 7/13 with 80% stenosis right SFA and occluded right PT (similar disease left SFA).  He had stent right SFA and angioplasty to PT.   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   LVAD INTERROGATION:  HeartMate 3 LVAD:   Flow 3.9 liters/min, speed 5500, power 4.4, PI 4.6.  3 PI events/24 hrs.   Objective:    Vital Signs:   Temp:  [97 F (36.1 C)-99.2 F (37.3 C)] 97 F (36.1 C) (07/16 0827) Pulse Rate:  [60-80] 67 (07/16 0826) Resp:  [9-18] 11 (07/16 0826) BP: (78-108)/(57-88) 95/67 (07/16 0822) SpO2:  [95 %-100 %] 95 % (07/16 0826) Weight:  [86.3 kg-87.8 kg] 87.8 kg (07/16 0539) Last BM Date: 03/18/19 Mean arterial Pressure 90s  Intake/Output:   Intake/Output Summary (Last 24 hours) at 03/21/2019 0853 Last data filed at 03/21/2019 0754 Gross per 24 hour  Intake 740 ml  Output 2020 ml  Net -1280 ml     Physical Exam    General: Well appearing this am. NAD.  HEENT: Normal. Neck: Supple, JVP 7-8 cm. Carotids OK.  Cardiac:  Mechanical heart sounds with LVAD hum present.  Lungs:  CTAB, normal effort.  Abdomen:  NT, ND, no HSM. No bruits or masses. +BS  LVAD exit site: Well-healed and incorporated. Dressing dry and intact. No erythema or drainage. Stabilization device present and accurately applied. Driveline dressing changed daily per sterile technique. Extremities:  Warm and dry. No cyanosis, clubbing, rash, or edema. 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  Neuro:  Alert & oriented x 3. Cranial nerves grossly intact. Moves all 4 extremities w/o difficulty. Affect pleasant      Telemetry   NSR 80s (personally reviewed)   Labs   Basic Metabolic Panel:  Recent Labs  Lab 03/17/19 0443 03/18/19 0304 03/19/19 0308 03/20/19 0225 03/21/19 0533  NA 134* 132* 136 133* 133*  K 4.9 4.8 4.1 4.1 3.7  CL 98 96* 101 99 95*  CO2 20* 20* 23 20* 23  GLUCOSE 87 94 179* 171* 119*  BUN 51* 67* 44* 59* 32*  CREATININE 9.06* 10.85* 8.18* 10.64* 7.07*  CALCIUM 9.4 9.1 8.4* 8.6* 8.6*    Liver Function Tests: Recent Labs  Lab 03/15/19 1807  AST 21  ALT 18  ALKPHOS 101  BILITOT 0.7  PROT 7.6  ALBUMIN 3.5   No results for input(s): LIPASE, AMYLASE in the last 168 hours. No results for input(s): AMMONIA in the last 168 hours.  CBC: Recent Labs  Lab 03/15/19 1807 03/16/19 0633 03/17/19 0443 03/18/19 0304 03/19/19 0308 03/20/19 0225  WBC 12.7* 11.7* 15.3* 14.5* 13.3* 12.9*  NEUTROABS 9.4*  --   --   --   --   --   HGB 11.6* 10.9* 11.0* 9.6* 8.6* 8.3*  HCT 35.3* 34.7* 33.9* 30.1* 26.6* 26.2*  MCV 91.7 94.6 92.4 93.2 93.7 92.9  PLT 197 198 198 203 183 181    INR: Recent Labs  Lab 03/17/19 0443 03/18/19 0304 03/19/19 0308 03/20/19 0225 03/21/19 0236  INR 1.9* 1.7* 1.6* 1.4* 1.9*    Other results:  EKG:    Imaging   No  results found.   Medications:     Scheduled Medications: . amiodarone  200 mg Oral Daily  . aspirin EC  81 mg Oral Daily  . atorvastatin  40 mg Oral q1800  . Chlorhexidine Gluconate Cloth  6 each Topical Daily  . Chlorhexidine Gluconate Cloth  6 each Topical Daily  . Chlorhexidine Gluconate Cloth  6 each Topical Q0600  . citalopram  20 mg Oral Daily  . darbepoetin (ARANESP) injection - DIALYSIS  150 mcg Intravenous Q Wed-HD  . doxercalciferol  3 mcg Intravenous Q M,W,F-HD  . dronabinol  2.5 mg Oral BID AC  . erythromycin  250 mg Oral TID  . gabapentin  100 mg Oral BID  . insulin aspart  0-15 Units Subcutaneous TID WC  . insulin aspart  0-5 Units Subcutaneous QHS  . insulin aspart  7 Units Subcutaneous QPM  . insulin glargine  5 Units Subcutaneous Daily  . metoCLOPramide  10 mg Oral TID  .  midodrine  5 mg Oral Q M,W,F  . multivitamin  1 tablet Oral Daily  . pantoprazole  40 mg Oral BID  . sevelamer carbonate  4,000 mg Oral TID WC  . sodium chloride flush  10-40 mL Intracatheter Q12H  . sodium chloride flush  3 mL Intravenous Q12H    Infusions: . sodium chloride    . ferric gluconate (FERRLECIT/NULECIT) IV Stopped (03/20/19 1210)    PRN Medications: sodium chloride, acetaminophen, docusate sodium, fentaNYL (SUBLIMAZE) injection, hydrALAZINE, labetalol, lidocaine-prilocaine, LORazepam, ondansetron (ZOFRAN) IV, promethazine **OR** promethazine, sevelamer carbonate, sodium chloride flush, sodium chloride flush, traMADol    Assessment/Plan:    1. Right foot pain/PAD: Ulcer on 4th toe,  ischemic due to PAD. Peripheral angiogram 7/13 with 80% right SFA stenosis and occluded right PT.  He had stenting of SFA and PTCA of PT with restoration of flow. Right PT pulse now dopperable. However, the toe was still exquisitely painful and he was unable to easily bear weight.  He is now s/p amputation right 4th toe on 7/16, considerably improved pain.  2. Chronic systolic CHF: Ischemic cardiomyopathy. St Jude ICD. NSTEMI in March 2018 with DES to LAD and RCA, complicated by cardiogenic shock, low output requiring milrinone. Echo 8/18 with EF 20-25%, moderate MR. CPX (8/18) with severe functional impairment due to HF. He is s/p HM3 LVAD placement. He had pump thrombosis 6/19 due to [facility] outflow graft kinking. He had pump exchange for another HM3. INR 1.9 today, on heparin gtt and warfarin.  - Volume managed at dialysis, had today without problems.   - Continue ASA 81. Can stop heparin gtt with INR 1.9.     - He has been referred to [facility] for evaluation for heart/kidney transplantand recently saw Dr Mosetta Pigeon. 3. ESRD: Developed peri-operatively after HM3 placement. 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  -He is currently looking for  a living donor. - HD tomorrow.   4. CAD: NSTEMI in 3/18. LHC with 99% ulcerated lesion proximal RCA with left to right collaterals, 95% mid LAD stenosis after mid LAD stent. s/p PCI to RCA and LAD on 11/25/16.  -Continue statin and ASA. 4. h/o DVTs: Factor V Leiden heterozygote. - Anticoagulated.  5. Diabetic gastroparesis: Difficult to manage. He has seen gastroparesis specialist at [facility]. Surprisingly, gastric emptying study was normal. However, electrogastrogram showed poor gastric accomodation/capacity. Therefore, small frequent meals recommended. Recently doing better.   - Continue reglan 5 mg tid/ac and  Marinol.  - Continue Protonix bid.  - He will continue erythromycinindefinitely.  6. DM: Cover with SSI and home glargine.  7. HTN:MAP has been lower with dialysis, now off Bidil and getting midodrine prior to HD.  8. Atrial flutter: Paroxysmal. He has been in NSR recently.  - Continue amiodarone. 9. Left hand tingling and grip weakness: Present since he developed left arm hematoma from failed AV graft. 10. Difficult IV access: Placed IJ.   If he is able to walk without problems tomorrow, may be able to go home after HD.   I reviewed the LVAD parameters from today, and compared the results to the patient's prior recorded data.  No programming changes were made.  The LVAD is functioning within specified parameters.  The patient performs LVAD self-test daily.  LVAD interrogation was negative for any significant power changes, alarms or PI events/speed drops.  LVAD equipment check completed and is in good working order.  Back-up equipment present.   LVAD education done on emergency procedures and precautions and reviewed exit site care.  Length of Stay: Potter, MD 03/21/2019, 8:53 AM  VAD Team --- VAD ISSUES ONLY--- Pager [phone] (7am - 7am)  Advanced Heart Failure Team  Pager [phone] (M-F; 7a - 4p)  Please contact [facility] for night-coverage  after hours (4p -7a ) and weekends on [email or web address]

## 2019-03-21 NOTE — Op Note (Signed)
    Patient name: [name] MRN: [id number] DOB: August 26, 1966 Sex: male  03/21/2019 Pre-operative Diagnosis: Gangrene right fourth toe Post-operative diagnosis:  Same Surgeon:  Erlene Quan C. Donzetta Matters, MD Procedure Performed: Ray amputation right fourth toe  Indications: 53 year old male with ventricular assist device.  Recent underwent right lower extremity angiography with intervention SFA stenting and posterior tibial artery balloon angioplasty from retrograde access.  Now indicated for amputation of right fourth toe for gangrenous changes and severe pain.  Findings: There is significant bleeding in the wound bed to suggest healing.  Skin was easily reapproximated.   Procedure:  The patient was identified in the holding area and taken to the operating room where is placed upon operative table.  An ankle block of previous been placed.  Timeout was called and antibiotics administered he was sterilely prepped draped the right lower extremity.  Ankle block was confirmed to be working.  Tennis racquet type incision made around fourth toe.  Dissected back to the first joint space and remove the first toe block.  Joint capsule was removed with rondure area smoothed with rasp.  Hemostasis obtained.  It was thoroughly irrigated.  Skin was reapproximated with interrupted 3-0 nylon suture.  Dry dressing was placed.  He tolerated procedure without immediate complication.  Counts were correct at completion.  EBL: 10 cc   Brandon C. Donzetta Matters, MD [facility] Office: [phone] Pager: [phone]

## 2019-03-21 NOTE — Progress Notes (Signed)
[facility] Progress Note   Subjective:   Patient seen and examined at bedside post surgery.  Denies pain, SOB, edema, and n/v/d.  States he does not think they are taking as much fluid off here as they do at OP unit.  Max net UF goal at OP unit 3L.  If weights correct 3.5L over EDW.      Objective Vitals:   03/21/19 1000 03/21/19 1030 03/21/19 1100 03/21/19 1130  BP: (!) 88/74 94/79 (!) 63/52 106/81  Pulse: 69 66 64 66  Resp: 13 12 18  (!) 9  Temp:      TempSrc:      SpO2: 98% 99% 97% 99%  Weight:      Height:       Physical Exam General:NAD, pleasant male, sitting in bed Heart:+mechanical heart sounds, +hum of LVAD Lungs:mostly clear, hard to appreciate w/LVAD Abdomen:soft, NTND Extremities:no edema, R foot bandaged Dialysis Access: L IJ [facility]   Filed Weights   03/20/19 0727 03/20/19 1140 03/21/19 0539  Weight: 88.3 kg 86.3 kg 87.8 kg    Intake/Output Summary (Last 24 hours) at 03/21/2019 1212 Last data filed at 03/21/2019 0754 Gross per 24 hour  Intake 740 ml  Output 15 ml  Net 725 ml    Additional Objective Labs: Basic Metabolic Panel: Recent Labs  Lab 03/19/19 0308 03/20/19 0225 03/21/19 0533  NA 136 133* 133*  K 4.1 4.1 3.7  CL 101 99 95*  CO2 23 20* 23  GLUCOSE 179* 171* 119*  BUN 44* 59* 32*  CREATININE 8.18* 10.64* 7.07*  CALCIUM 8.4* 8.6* 8.6*   Liver Function Tests: Recent Labs  Lab 03/15/19 1807  AST 21  ALT 18  ALKPHOS 101  BILITOT 0.7  PROT 7.6  ALBUMIN 3.5   CBC: Recent Labs  Lab 03/15/19 1807  03/17/19 0443 03/18/19 0304 03/19/19 0308 03/20/19 0225 03/21/19 0850  WBC 12.7*   < > 15.3* 14.5* 13.3* 12.9* 14.6*  NEUTROABS 9.4*  --   --   --   --   --   --   HGB 11.6*   < > 11.0* 9.6* 8.6* 8.3* 8.6*  HCT 35.3*   < > 33.9* 30.1* 26.6* 26.2* 27.0*  MCV 91.7   < > 92.4 93.2 93.7 92.9 92.8  PLT 197   < > 198 203 183 181 219   < > = values in this interval not displayed.   Blood Culture    Component Value  Date/Time   SDES BLOOD RIGHT HAND 08/28/2018 2130   SPECREQUEST  08/28/2018 2130    BOTTLES DRAWN AEROBIC ONLY Blood Culture results may not be optimal due to an excessive volume of blood received in culture bottles   CULT  08/28/2018 2130    NO GROWTH 5 DAYS Performed at [facility], [place] [address], [place], [place] [address]    REPTSTATUS 09/02/2018 FINAL 08/28/2018 2130    CBG: Recent Labs  Lab 03/20/19 1601 03/20/19 2127 03/21/19 0622 03/21/19 0809 03/21/19 1150  GLUCAP 120* 130* 113* 99 157*   Lab Results  Component Value Date   INR 1.9 (H) 03/21/2019   INR 1.4 (H) 03/20/2019   INR 1.6 (H) 03/19/2019   Studies/Results: No results found.  Medications: . sodium chloride    . ferric gluconate (FERRLECIT/NULECIT) IV Stopped (03/20/19 1210)   . amiodarone  200 mg Oral Daily  . aspirin EC  81 mg Oral Daily  . atorvastatin  40 mg Oral q1800  .  Chlorhexidine Gluconate Cloth  6 each Topical Daily  . Chlorhexidine Gluconate Cloth  6 each Topical Daily  . Chlorhexidine Gluconate Cloth  6 each Topical Q0600  . citalopram  20 mg Oral Daily  . darbepoetin (ARANESP) injection - DIALYSIS  150 mcg Intravenous Q Wed-HD  . doxercalciferol  3 mcg Intravenous Q M,W,F-HD  . dronabinol  2.5 mg Oral BID AC  . erythromycin  250 mg Oral TID  . gabapentin  100 mg Oral BID  . insulin aspart  0-15 Units Subcutaneous TID WC  . insulin aspart  0-5 Units Subcutaneous QHS  . insulin aspart  7 Units Subcutaneous QPM  . insulin glargine  5 Units Subcutaneous Daily  . metoCLOPramide  10 mg Oral TID  . midodrine  5 mg Oral Q M,W,F  . multivitamin  1 tablet Oral Daily  . pantoprazole  40 mg Oral BID  . sevelamer carbonate  4,000 mg Oral TID WC  . sodium chloride flush  10-40 mL Intracatheter Q12H  . sodium chloride flush  3 mL Intravenous Q12H  . warfarin  7.5 mg Oral ONCE-1800  . Warfarin - Pharmacist Dosing Inpatient   Does not apply q1800    Dialysis Orders: Dialysis Orders:  Center:[facility] MWF. TKW40 kgHD Bath 2 K, 3 CATime 4hr 8mnHeparin NONE. AccessR IJ Perm cath  Hec 331m IV/HD  Venofer 50 mg q wkly hd  NO ESA Last hgb 10.8 ( was last on Mircera 60 mcg 6/03 )   Assessment/Plan: Pt is a [age].o. yo male with ESRD and LVAD who was admitted on 03/15/2019 with ischemic right foot  1. PAD - s/p arteriogram, angioplasty w/stent to R SFA on 7/13 and 4th R toe amputation on 7/16.  Foot pain currently well controlled. Per Vascular 2. ESRD - on HD MWF via [facility]. K 3.7. Orders written for HD tomorrow per regular schedule.  3. Anemia of CKD- Hgb 8.6. On Fe, ESA given with HD yesterday. 4. Secondary hyperparathyroidism - Ca *, checking phos today.  Has been refusing renvela.  Continue VDRA.   5. Hypotension/volume - BP ok, on midodrine. Does not appear volume overloaded, 3.5kg over EDW.  Plan for UF 3L tomorrow.  6. Nutrition - Renal diet w/fluid restrictions 7. LVAD - per cards 8. Dispo - going home once INR therapeutic and foot pain well controlled.   LiJen MowPA-C [facility] Pager: [phone]/16/2020,12:12 PM  LOS: 6 days

## 2019-03-21 NOTE — Plan of Care (Signed)

## 2019-03-22 ENCOUNTER — Encounter (HOSPITAL_COMMUNITY): Payer: Self-pay | Admitting: Vascular Surgery

## 2019-03-22 LAB — CBC
HCT: 27.4 % — ABNORMAL LOW (ref 39.0–52.0)
HCT: 26.2 % — ABNORMAL LOW (ref 39.0–52.0)
Hemoglobin: 8.9 g/dL — ABNORMAL LOW (ref 13.0–17.0)
Hemoglobin: 8.6 g/dL — ABNORMAL LOW (ref 13.0–17.0)
MCH: 29.8 pg (ref 26.0–34.0)
MCH: 30.5 pg (ref 26.0–34.0)
MCHC: 32.5 g/dL (ref 30.0–36.0)
MCHC: 32.8 g/dL (ref 30.0–36.0)
MCV: 91.6 fL (ref 80.0–100.0)
MCV: 92.9 fL (ref 80.0–100.0)
Platelets: 221 10*3/uL (ref 150–400)
Platelets: 230 10*3/uL (ref 150–400)
RBC: 2.99 MIL/uL — ABNORMAL LOW (ref 4.22–5.81)
RBC: 2.82 MIL/uL — ABNORMAL LOW (ref 4.22–5.81)
RDW: 14.6 % (ref 11.5–15.5)
RDW: 14.8 % (ref 11.5–15.5)
WBC: 16.1 10*3/uL — ABNORMAL HIGH (ref 4.0–10.5)
WBC: 15.4 10*3/uL — ABNORMAL HIGH (ref 4.0–10.5)
nRBC: 0.1 % (ref 0.0–0.2)
nRBC: 0.4 % — ABNORMAL HIGH (ref 0.0–0.2)

## 2019-03-22 LAB — BASIC METABOLIC PANEL
Anion gap: 15 (ref 5–15)
BUN: 32 mg/dL — ABNORMAL HIGH (ref 6–20)
CO2: 23 mmol/L (ref 22–32)
Calcium: 8.6 mg/dL — ABNORMAL LOW (ref 8.9–10.3)
Chloride: 95 mmol/L — ABNORMAL LOW (ref 98–111)
Creatinine, Ser: 7.07 mg/dL — ABNORMAL HIGH (ref 0.61–1.24)
GFR calc Af Amer: 9 mL/min — ABNORMAL LOW (ref >60)
GFR calc non Af Amer: 8 mL/min — ABNORMAL LOW (ref >60)
Glucose, Bld: 119 mg/dL — ABNORMAL HIGH (ref 70–99)
Potassium: 3.7 mmol/L (ref 3.5–5.1)
Sodium: 133 mmol/L — ABNORMAL LOW (ref 135–145)

## 2019-03-22 LAB — PROTIME-INR
INR: 1.9 — ABNORMAL HIGH (ref 0.8–1.2)
Prothrombin Time: 21.6 seconds — ABNORMAL HIGH (ref 11.4–15.2)

## 2019-03-22 LAB — RENAL FUNCTION PANEL
Albumin: 2.6 g/dL — ABNORMAL LOW (ref 3.5–5.0)
Anion gap: 14 (ref 5–15)
BUN: 52 mg/dL — ABNORMAL HIGH (ref 6–20)
CO2: 22 mmol/L (ref 22–32)
Calcium: 8.7 mg/dL — ABNORMAL LOW (ref 8.9–10.3)
Chloride: 97 mmol/L — ABNORMAL LOW (ref 98–111)
Creatinine, Ser: 10.06 mg/dL — ABNORMAL HIGH (ref 0.61–1.24)
GFR calc Af Amer: 6 mL/min — ABNORMAL LOW (ref >60)
GFR calc non Af Amer: 5 mL/min — ABNORMAL LOW (ref >60)
Glucose, Bld: 166 mg/dL — ABNORMAL HIGH (ref 70–99)
Phosphorus: 6.1 mg/dL — ABNORMAL HIGH (ref 2.5–4.6)
Potassium: 3.8 mmol/L (ref 3.5–5.1)
Sodium: 133 mmol/L — ABNORMAL LOW (ref 135–145)

## 2019-03-22 LAB — GLUCOSE, CAPILLARY
Glucose-Capillary: 122 mg/dL — ABNORMAL HIGH (ref 70–99)
Glucose-Capillary: 129 mg/dL — ABNORMAL HIGH (ref 70–99)

## 2019-03-22 LAB — LACTATE DEHYDROGENASE: LDH: 170 U/L (ref 98–192)

## 2019-03-22 MED ORDER — LIDOCAINE HCL (PF) 1 % IJ SOLN: 5.0000 mL | INTRAMUSCULAR | Status: DC | PRN

## 2019-03-22 MED ORDER — OXYCODONE HCL 5 MG PO TABS: ORAL_TABLET | ORAL | 0 refills | Status: DC

## 2019-03-22 MED ORDER — WARFARIN SODIUM 7.5 MG PO TABS
7.5000 mg | ORAL_TABLET | Freq: Once | ORAL | Status: AC
  Administered 2019-03-22: 7.5 mg via ORAL
  Filled 2019-03-22: qty 1

## 2019-03-22 MED ORDER — PENTAFLUOROPROP-TETRAFLUOROETH EX AERO: 1.0000 "application " | INHALATION_SPRAY | CUTANEOUS | Status: DC | PRN

## 2019-03-22 MED ORDER — FENTANYL CITRATE (PF) 100 MCG/2ML IJ SOLN
INTRAMUSCULAR | Status: AC
  Filled 2019-03-22: qty 2

## 2019-03-22 MED ORDER — LIDOCAINE-PRILOCAINE 2.5-2.5 % EX CREA: 1.0000 "application " | TOPICAL_CREAM | CUTANEOUS | Status: DC | PRN

## 2019-03-22 MED ORDER — HEPARIN SODIUM (PORCINE) 1000 UNIT/ML DIALYSIS: 1000.0000 [IU] | INTRAMUSCULAR | Status: DC | PRN

## 2019-03-22 MED ORDER — ALTEPLASE 2 MG IJ SOLR: 2.0000 mg | Freq: Once | INTRAMUSCULAR | Status: DC | PRN

## 2019-03-22 MED ORDER — DOCUSATE SODIUM 100 MG PO CAPS: 100.0000 mg | ORAL_CAPSULE | Freq: Every day | ORAL | 0 refills | Status: DC | PRN

## 2019-03-22 MED ORDER — SODIUM CHLORIDE 0.9 % IV SOLN: 100.0000 mL | INTRAVENOUS | Status: DC | PRN

## 2019-03-22 MED ORDER — HEPARIN SODIUM (PORCINE) 1000 UNIT/ML IJ SOLN
INTRAMUSCULAR | Status: AC
  Filled 2019-03-22: qty 4

## 2019-03-22 MED ORDER — ACETAMINOPHEN 325 MG PO TABS: 650.0000 mg | ORAL_TABLET | ORAL | Status: DC | PRN

## 2019-03-22 NOTE — Progress Notes (Addendum)
Physical Therapy Treatment Patient Details Name: [name] MRN: [id number] DOB: 1966/06/23 Today's Date: 03/22/2019    History of Present Illness Pt adm with rt foot pain and found to have gangrene. Underwent rt 4th toe amputation on 7/16. PMH - LVAD 10/18 with emergent pump exchange 6/19, ESRD on HD, CHF, CAD, DM, cardiomyopathy.    PT Comments    Pt admitted with above diagnosis. Pt currently with functional limitations due to the deficits listed below (see PT Problem List). Pt was able to pivot to chair with min assist for safety as pt was in pain in right foot.  Took incr time to assist pt to chair as everytime he would sit up to EOB right foot would throb and he would lie down.  Pt had pain meds but was not helping with pain enough.  May need wheelchair for home if cannot progress to putting weight on foot.  Has 24 hour care per pt. Still awaiting MD to order post op shoe. Will follow acutely.  Pt will benefit from skilled PT to increase their independence and safety with mobility to allow discharge to the venue listed below.     Follow Up Recommendations  Home health PT;Supervision/Assistance - 24 hour     Equipment Recommendations  Wheelchair (18x16 lightweight with anti-tippers and desk armrests, foot rests);Wheelchair cushion (18x16 pressure relieving cushion)    Recommendations for Other Services       Precautions / Restrictions Precautions Precautions: Fall Precaution Comments: Asked MD for post op shoe for rt foot  Restrictions Weight Bearing Restrictions: No Other Position/Activity Restrictions: Assume pt to weight bear on rt heel     Mobility  Bed Mobility Overal bed mobility: Modified Independent             General bed mobility comments: Pt sat up and laid down several times as his right foot would have incr pain every attempt to sit up.    Transfers Overall transfer level: Needs assistance Equipment used: None Transfers: Set designer Transfers;Sit  to/from Stand Sit to Stand: Min assist   Squat pivot transfers: Min assist     General transfer comment: Assist for safety and verbal cues for hand placement.  Pt could not stand to RW as he was in too much pain.  Pt performed squat pivot transfer to chair due to could not put right foot on floor due to pain.  Needed min assist for safety and due to decr ability to tolerate weight on right foot.   Ambulation/Gait                 Stairs             Wheelchair Mobility    Modified Rankin (Stroke Patients Only)       Balance Overall balance assessment: Mild deficits observed, not formally tested                                          Cognition Arousal/Alertness: Awake/alert Behavior During Therapy: WFL for tasks assessed/performed Overall Cognitive Status: Within Functional Limits for tasks assessed                                        Exercises      General Comments  Pertinent Vitals/Pain Pain Assessment: Faces Pain Score: 8  Pain Location: rt foot Pain Descriptors / Indicators: Grimacing Pain Intervention(s): Limited activity within patient's tolerance;Monitored during session;Repositioned;RN gave pain meds during session;Patient requesting pain meds-RN notified    Home Living                      Prior Function            PT Goals (current goals can now be found in the care plan section) Acute Rehab PT Goals Patient Stated Goal: return home Progress towards PT goals: Progressing toward goals    Frequency    Min 3X/week      PT Plan Discharge plan needs to be updated    Co-evaluation              AM-PAC PT "6 Clicks" Mobility   Outcome Measure  Help needed turning from your back to your side while in a flat bed without using bedrails?: None Help needed moving from lying on your back to sitting on the side of a flat bed without using bedrails?: None Help needed moving to and  from a bed to a chair (including a wheelchair)?: A Little Help needed standing up from a chair using your arms (e.g., wheelchair or bedside chair)?: A Lot Help needed to walk in hospital room?: A Lot Help needed climbing 3-5 steps with a railing? : A Lot 6 Click Score: 17    End of Session   Activity Tolerance: Patient limited by pain Patient left: with call bell/phone within reach;in chair Nurse Communication: Mobility status;Patient requests pain meds PT Visit Diagnosis: Other abnormalities of gait and mobility (R26.89);Pain Pain - Right/Left: Right Pain - part of body: Ankle and joints of foot     Time: 0852-0927 PT Time Calculation (min) (ACUTE ONLY): 35 min  Charges:  $Therapeutic Activity: 23-37 mins                     [place] Pager:  [phone]  Office:  [place] 03/22/2019, 9:56 AM

## 2019-03-22 NOTE — Progress Notes (Addendum)
ANTICOAGULATION CONSULT NOTE - Follow Up Consult  Pharmacy Consult for heparin Indication: LVAD  Allergies  Allergen Reactions  . Metformin And Related Diarrhea    Patient Measurements: Height: 5' 7"  (170.2 cm) Weight: 195 lb 12.3 oz (88.8 kg) IBW/kg (Calculated) : 66.1 Heparin Dosing Weight: 82.2 kg  Vital Signs: Temp: 97.9 F (36.6 C) (07/17 0357) Temp Source: Oral (07/17 0357) BP: 101/85 (07/17 0357) Pulse Rate: 71 (07/17 0357)  Labs: Recent Labs    03/20/19 0225 03/21/19 0236 03/21/19 0533 03/21/19 0850 03/22/19 0225  HGB 8.3*  --   --  8.6* 8.9*  HCT 26.2*  --   --  27.0* 27.4*  PLT 181  --   --  219 221  LABPROT 17.1* 21.6*  --   --  21.6*  INR 1.4* 1.9*  --   --  1.9*  HEPARINUNFRC 0.35 0.35  --   --   --   CREATININE 10.64*  --  7.07*  --   --     Estimated Creatinine Clearance: 12.7 mL/min (A) (by C-G formula based on SCr of 7.07 mg/dL (H)).   Medical History: Past Medical History:  Diagnosis Date  . Angina   . ASCVD (arteriosclerotic cardiovascular disease)    , Anterior infarction 2005, LAD diagonal bifurcation intervention 03/2004  . Automatic implantable cardiac defibrillator -St. Jude's       . Benign neoplasm of colon   . CHF (congestive heart failure) ([place])   . Chronic systolic heart failure ([place])   . Coronary artery disease     Widely patent previously placed stents in the left anterior   . Crohn's disease ([place])   . Deep venous thrombosis ([facility])    Recurrent-on Coumadin  . Dialysis patient ([place])   . Dyspnea   . Gastroparesis   . GERD (gastroesophageal reflux disease)   . High cholesterol   . Hyperlipidemia   . Hypersomnolent    Previous diagnosis of narcolepsy  . Hypertension, essential   . Ischemic cardiomyopathy    Ejection fraction 15-20% catheterization 2010  . Type II or unspecified type diabetes mellitus without mention of complication, not stated as uncontrolled   . Unspecified gastritis and gastroduodenitis without mention  of hemorrhage     Medications:  Scheduled:  . amiodarone  200 mg Oral Daily  . aspirin EC  81 mg Oral Daily  . atorvastatin  40 mg Oral q1800  . Chlorhexidine Gluconate Cloth  6 each Topical Q0600  . citalopram  20 mg Oral Daily  . darbepoetin (ARANESP) injection - DIALYSIS  150 mcg Intravenous Q Wed-HD  . doxercalciferol  3 mcg Intravenous Q M,W,F-HD  . dronabinol  2.5 mg Oral BID AC  . erythromycin  250 mg Oral TID  . gabapentin  100 mg Oral BID  . insulin aspart  0-15 Units Subcutaneous TID WC  . insulin aspart  0-5 Units Subcutaneous QHS  . insulin aspart  7 Units Subcutaneous QPM  . insulin glargine  5 Units Subcutaneous Daily  . metoCLOPramide  10 mg Oral TID  . midodrine  5 mg Oral Q M,W,F  . multivitamin  1 tablet Oral Daily  . pantoprazole  40 mg Oral BID  . sevelamer carbonate  4,000 mg Oral TID WC  . sodium chloride flush  10-40 mL Intracatheter Q12H  . sodium chloride flush  3 mL Intravenous Q12H  . Warfarin - Pharmacist Dosing Inpatient   Does not apply q1800   Infusions:  . sodium chloride    .  ferric gluconate (FERRLECIT/NULECIT) IV Stopped (03/20/19 1210)    Assessment: 81 yoM with HM3 LVAD admitted with R foot pain and concern for ischemic foot now s/p revascularization. Warfarin and heparin resumed post-op and subsequently held for R toe amputation on 7/16.  INR this morning remains at 1.9, LDH and CBC stable.  **PTA Warfarin Dose = 7.69m Tues/Fri, 546mall other days  Goal of Therapy:  Heparin level 0.3-0.7 units/ml Monitor platelets by anticoagulation protocol: Yes   Plan:  -Warfarin 7.101m53mO x1 tonight -Daily INR, CBC, LDH -If discharged, recommend resuming previous home dosage noted above with INR follow-up next Friday with home monitor   MicArrie SenateharmD, BCPS Clinical Pharmacist [phone] check AMION for all [facility] 03/22/2019

## 2019-03-22 NOTE — Progress Notes (Signed)
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  They will size him when he returns from HD.  Spoke w/Dr. Aundra Dubin and he is aware that vascular is OK w/discharge home today.  Await return from HD.

## 2019-03-22 NOTE — Discharge Summary (Signed)
Discharge Summary    Patient ID: [name],  MRN: [id number], DOB/AGE: [birth year] 53 y.o.  Admit date: 03/15/2019 Discharge date: 03/22/2019  Primary Care Provider: Richarda Osmond Primary Cardiologist: Loralie Champagne, MD  Discharge Diagnoses    Principal Problem:   Ischemic foot Active Problems:   Type 2 diabetes mellitus ([place])   Dyslipidemia, goal LDL below 70   Coronary artery disease   Automatic implantable cardioverter-defibrillator in situ   CHF (congestive heart failure) ([place])   ESRD on dialysis [facility])   LVAD (left ventricular assist device) present (HCC)   Allergies Allergies  Allergen Reactions   Metformin And Related Diarrhea    Diagnostic Studies/Procedures    Abdominal aortogram with LE 03/18/2019: Findings: His aorta and iliac segments are free of any flow-limiting stenosis.  The right side is the site of interest.  There is approximately 80% SFA stenosis distally which is resolved 0% after stenting.  His peroneal artery runs off to the mid calf.  His posterior tibial artery occludes after several centimeters proximally and reconstitutes distally in the calf.  After balloon angioplasty there is no flow-limiting stenosis.  Does not appear to be any flow-limiting dissection although there is may be minimal dissection distally in the artery.  Does have much improved flow through the posterior tibial artery at completion.  The left side SFA has a similar stenosis as the right.  Appears peroneal artery is the dominant runoff although could not see if it runs all the way to the foot. _____________   History of Present Illness     53 y.o.with history of CAD s/p anterior MI in 2010 and NSTEMI in 3/18 with DES to pRCA and mLAD and ischemic cardiomyopathy now s/p Heartmate 3 LVAD and ESRD. Patient developed low output heart failure. He was not deemed to be a good transplant candidate. He was admitted in 10/18 and Heartmate 3 LVAD was placed. He had  baseline CKD stage 3 likely from diabetic nephropathy and CHF. He developed peri-op AKI and ended up on dialysis. No renal recovery to date. St Jude ICD has been turned back on and ramp echo was done prior to discharge in 10/18, speed increased to 5600 rpm.   He was admitted in 11/18 with nausea/vomiting and abdominal pain. This appears to have been due to diabetic gastroparesis (extensive GI evaluation). He had atrial flutter this admission that was converted to NSR with amiodarone. He improved considerably with scheduled Reglan and erythromycin. He was re-admitted later in 11/18 with the same symptoms, suspect diabetic gastroparesis and was again treated with erythromycin. He was not able to start domperidone due to prolonged QT interval. He had nausea again about a week ago but was able to manage at home with Phenergan suppositories.   In 2019 he had 4 admits for uncontrolled N/V in the setting of gastroparesis flare. Treated with erythromycin. He has been evaluated at [facility] by Dr. Derrill Kay (gastroparesis specialist).Gastric emptying study, surprisingly, was near normal. However, electrogastrogram showed poor gastric accomodation/capacity, "bradygastria." Frequent small meals were recommended. Also, early am nausea was thought to be possible GERD.   He was admitted in 2/19 for AV graft placement. While in the hospital, we turned down his speed. Since then, he has had considerably fewer PI events on HD days.   Admitted 6/26 - 03/19/18 for emergent VAD exchange 03/01/18 in the setting of HM3 outflow graft kink.He required CVVHD post op, but was [facility] back to [facility] without problems. Course complicated  by leukocytosis and fever. CT abd/chest negative for infection and blood cultures were negative. Post op pain well controlled withraretramadol. Able to tolerate diet. Evaluated by PT/OT and HH PT recommended for discharge. Ramp echo completed prior to discharge with speed optimization.  Resumed81 mg ASA + coumadin with INR goal 2-3.  He was admitted in 9/19 for LUE AV fistula placement. He was readmitted in 10/19 with his typical gastroparesis symptoms, unable to take po. The fistula later failed and was replaced with a graft. However, the graft was nonfunctional and developed a hematoma at the surgical site.   He was admitted in 12/19 with nausea/vomiting from HD. Also with LDH increased to 427 in setting of extensive hematoma around his left arm AV graft. He has had tingling in his left hand and left grip is weak.   He was seeing his PCP 03/15/2019 and reported right foot pain. The skin had peeled off his right 4th toe.  He was thought to have an ischemic foot so was admitted. He reported that his whole right foot has been aching for 1 month, steadily worsening.  It hurts worse when he walks.  He has not had abdominal pain or nausea recently.  Tolerating HD reasonably well, had HD on the day of admission. He had a low flow during HD.  Case has been discussed with VVS, concern for ischemic foot, plan for arteriogram on Monday. He denies fever, T 99 currently.   LVAD INTERROGATION:  HeartMate II LVAD:  Flow 3.5 liters/min, speed 5600, power 4.3, PI 7.1.  Multiple PI events and 1 low flow with HD today.    Hospital Course     Consultants: Vascular surgery, Nephrology, and [place]   1. 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 Vascular surgery was consulted and he underwent an abdominal aortogram with LE 03/18/2019 with stenting of his SFA and PTCA of PT with restoration of flow. Unfortunately toe pain continued limiting ambulation and he underwent amputation of his 4th toe 54/76/2831 without complications. He was recommended to wear a surgical boot - Patient to follow-up vascular surgery in 2-3 weeks - Rx for oxycodone 82m - 1-2 tabs every 4-6 hours as needed for severe pain #15 tabs prescribed at discharge.   2. Chronic combined CHF: patient with  ischemic cardiomyopathy s/p ICD. He had an NSTEMI 11/2016 managed with PCI/DES to LAD and RCA c/b cardiogenic shock with low output requiring milrinone. CPX in 2018 with severe function impairment due to HF. He underwent HM3 LVAD placement with pump exchange after outflow graft kinking in 2019. He was maintained on heparin this admission and coumadin resumed following surgery 03/21/2019. INR 1.9 on the day of discharge.  - LVAD coordinator to arrange outpatient follow-up and INR monitoring.  - Volume managed at dialysis - last session 03/22/2019 prior to discharge - Continue aspirin 814mdaily and coumadin with goal 2-2.5 - [facility]  3. ESRD on HD: developed perioperatively after HM3 placement. Underwent dialysis 03/22/2019 prior to discharge - Continue M/W/F dialysis  [facility]  4. CAD s/p PCI/DES to LAD and RCA in 2018: s/p stenting after presenting with an NSTEMI. No anginal complaints this admission - Continue aspirin and statin  5. Factor V Leiden: patient with history of DVTs. On coumadin. INR 1.9 on the day of discharge - Continue coumadin  6. Diabetic gastroparesis: He has seen a  gastroparesis specialist at [facility]. Difficult to manage. Prior normal gastric emptying study but electrogastrogram showed poor gastric accomodation/capacity; recommended for small frequent meals. Overall improved - Continue reglan 15m TID/AC and marinol - Continue protonix BID - Continue erythromycin indefinitely  7. DM type 2: Maintained on an ISS this admission - Resumed home insulin at discharge  8. HTN: MAP has been lower with dialysis, now off Bidil and getting midodrine prior to HD - Continue midodrine  9. Paroxysmal atrial flutter: maintained NSR this admission. On coumadin for stroke ppx. On amiodarone for rhythm control - Continue amiodarone and coumadin  10. Leukocytosis:  WBC elevated but no fevers or obvious infectious source. Possibly stress reaction due to surgery 03/21/2019.   11. Difficult IV access: IJ placed this admission  12. Left hand tingling/ grip weakness: has been present since developed left arm hematoma from failed AV graft _____________  Discharge Vitals Blood pressure 139/72, pulse 75, temperature 98.4 F (36.9 C), temperature source Oral, resp. rate (!) 9, height 5' 7"  (1.702 m), weight 88 kg, SpO2 96 %.  Filed Weights   03/21/19 0900 03/22/19 0357 03/22/19 1240  Weight: 87.8 kg 88.8 kg 88 kg    Labs & Radiologic Studies    CBC Recent Labs    03/22/19 0225 03/22/19 1341  WBC 16.1* 15.4*  HGB 8.9* 8.6*  HCT 27.4* 26.2*  MCV 91.6 92.9  PLT 221 2594  Basic Metabolic Panel Recent Labs    03/21/19 0533 03/22/19 1341  NA 133* 133*  K 3.7 3.8  CL 95* 97*  CO2 23 22  GLUCOSE 119* 166*  BUN 32* 52*  CREATININE 7.07* 10.06*  CALCIUM 8.6* 8.7*  PHOS 5.4* 6.1*   Liver Function Tests Recent Labs    03/22/19 1341  ALBUMIN 2.6*   No results for input(s): LIPASE, AMYLASE in the last 72 hours. Cardiac Enzymes No results for input(s): CKTOTAL, CKMB, CKMBINDEX, TROPONINI in the last 72 hours. BNP Invalid input(s): POCBNP D-Dimer No results for input(s): DDIMER in the last 72 hours. Hemoglobin A1C No results for input(s): HGBA1C in the last 72 hours. Fasting Lipid Panel No results for input(s): CHOL, HDL, LDLCALC, TRIG, CHOLHDL, LDLDIRECT in the last 72 hours. Thyroid Function Tests No results for input(s): TSH, T4TOTAL, T3FREE, THYROIDAB in the last 72 hours.  Invalid input(s): FREET3 _____________  Dg Chest Port 1 View  Result Date: 03/15/2019 CLINICAL DATA:  Central line infiltration EXAM: PORTABLE CHEST 1 VIEW COMPARISON:  08/29/2018 FINDINGS: Cardiac shadow is stable. LVAD is again identified. Defibrillator is again seen. Right jugular central line is noted in the cavoatrial junction. Left dialysis catheter is  seen. No pneumothorax is noted. Lungs are clear and stable. No bony abnormality is seen. IMPRESSION: Tubes and lines as described above.  No acute abnormality noted. Electronically Signed   By: MInez CatalinaM.D.   On: 03/15/2019 21:05   Dg Foot Complete Right  Result Date: 03/15/2019 CLINICAL DATA:  Right foot pain EXAM: RIGHT FOOT COMPLETE - 3+ VIEW COMPARISON:  03/11/2019 FINDINGS: No acute fracture or dislocation is noted. Diffuse vascular calcifications are again seen. No soft tissue abnormality is noted. Mild osteopenia is again noted. IMPRESSION: Stable appearance of the right foot.  No acute abnormality noted. Electronically Signed   By: MInez CatalinaM.D.   On: 03/15/2019 21:07   Dg Foot Complete Right  Result Date: 03/11/2019 CLINICAL DATA:  53year old male with pain in the right third-fifth toes. No known injury. EXAM: RIGHT FOOT  COMPLETE - 3+ VIEW COMPARISON:  Right lower extremity MRI dated 01/30/2008 FINDINGS: There is no acute fracture or dislocation. The bones are osteopenic. No significant arthritic changes. No bony erosion or periosteal elevation. Extensive vascular calcifications noted. The soft tissues are unremarkable. IMPRESSION: No acute fracture or dislocation. No evidence of acute osteomyelitis by radiograph. Electronically Signed   By: Anner Crete M.D.   On: 03/11/2019 12:45   Vas [place] Lower Extremity Arterial Duplex  Result Date: 03/16/2019 LOWER EXTREMITY ARTERIAL DUPLEX STUDY Indications: Non-palpable pulses. High Risk Factors: Hypertension, hyperlipidemia, Diabetes, coronary artery                    disease. Other Factors: LVAD.  Current ABI: Not needed per ordering physician Performing Technologist: Maudry Mayhew, MHA, RDMS, RVT, RDCS  Examination Guidelines: A complete evaluation includes B-mode imaging, spectral Doppler, color Doppler, and power Doppler as needed of all accessible portions of each vessel. Bilateral testing is considered an integral part of a  complete examination. Limited examinations for reoccurring indications may be performed as noted.  +-----------+-----------------------------------------+  RIGHT       Comments                                   +-----------+-----------------------------------------+  CFA Distal  Patent                                     +-----------+-----------------------------------------+  DFA         Patent                                     +-----------+-----------------------------------------+  SFA Prox    Patent                                     +-----------+-----------------------------------------+  SFA Mid     Patent, moderate smooth homogenous plaque  +-----------+-----------------------------------------+  SFA Distal  Patent, mild smooth heterogenous plaque    +-----------+-----------------------------------------+  POP Distal  Patent                                     +-----------+-----------------------------------------+  TP Trunk    Patent                                     +-----------+-----------------------------------------+  ATA Distal  Patent                                     +-----------+-----------------------------------------+  PTA Distal  Patent                                     +-----------+-----------------------------------------+  PERO Distal Patent                                     +-----------+-----------------------------------------+  +-----------+---------------------------------------+  LEFT        Comments                                 +-----------+---------------------------------------+  CFA Distal  Patent                                   +-----------+---------------------------------------+  DFA         Patent                                   +-----------+---------------------------------------+  SFA Prox    Patent                                   +-----------+---------------------------------------+  SFA Mid     Patent                                    +-----------+---------------------------------------+  SFA Distal  Patent, mild smooth heterogenous plaque  +-----------+---------------------------------------+  POP Distal  Patent                                   +-----------+---------------------------------------+  TP Trunk    Unable to visualize                      +-----------+---------------------------------------+  ATA Distal  Patent                                   +-----------+---------------------------------------+  PTA Distal  Patent                                   +-----------+---------------------------------------+  PERO Distal Patent                                   +-----------+---------------------------------------+  Summary: Bilateral: No obvious evidence of ischemia. All visualized arteries are patent. *Unable to estimate stenoses by velocity recording due to presence of LVAD.  See table(s) above for measurements and observations. Electronically signed by Ruta Hinds MD on 03/16/2019 at 10:27:10 AM.    Final    [place] Ekg Site Rite  Result Date: 03/15/2019 If Site Rite image not attached, placement could not be confirmed due to current cardiac rhythm.  Disposition   Patient was seen and examined by Dr. Aundra Dubin who deemed patient as stable for discharge. Follow-up has been arranged. Discharge medications as listed below.   Follow-up Plans & Appointments    Follow-up Information    Doristine Mango L, DO Follow up on 04/11/2019.   Specialty: Family Medicine Why: 11 am for hospital follow up Contact information: [address] [address] [phone]        Waynetta Sandy, MD Follow up on 04/05/2019.   Specialties: Vascular Surgery, Cardiology Why: Please arrive 15 minutes early for your 3:00pm post-hospital follow-up appointment Contact information: [place]  [place] [address] [phone]            Discharge Medications   Allergies as of 03/22/2019      Reactions    Metformin And Related Diarrhea      Medication List    TAKE these medications   acetaminophen 325 MG tablet Commonly known as: TYLENOL Take 2 tablets (650 mg total) by mouth every 4 (four) hours as needed for headache or mild pain.   amiodarone 200 MG tablet Commonly known as: PACERONE Take 200 mg by mouth daily.   aspirin EC 81 MG tablet Take 1 tablet (81 mg total) by mouth daily.   atorvastatin 40 MG tablet Commonly known as: LIPITOR Take 1 tablet (40 mg total) by mouth daily at 6 PM.   calcitRIOL 0.5 MCG capsule Commonly known as: ROCALTROL Take 1 capsule (0.5 mcg total) by mouth every Monday, Wednesday, and Friday.   citalopram 20 MG tablet Commonly known as: CELEXA Take 1 tablet (20 mg total) by mouth daily.   docusate sodium 100 MG capsule Commonly known as: COLACE Take 1 capsule (100 mg total) by mouth daily as needed for mild constipation.   dronabinol 2.5 MG capsule Commonly known as: MARINOL Take 1 capsule (2.5 mg total) by mouth 2 (two) times daily before lunch and supper.   erythromycin 250 MG tablet Commonly known as: E-MYCIN Take 1 tablet (250 mg total) by mouth 3 (three) times daily.   gabapentin 100 MG capsule Commonly known as: NEURONTIN Take 1 capsule (100 mg total) by mouth 2 (two) times daily.   insulin glargine 100 UNIT/ML injection Commonly known as: LANTUS Inject 0.1 mLs (10 Units total) into the skin daily. What changed: how much to take   LORazepam 0.5 MG tablet Commonly known as: ATIVAN Take 1 tablet (0.5 mg total) by mouth every 12 (twelve) hours as needed (nausea).   metoCLOPramide 10 MG tablet Commonly known as: REGLAN Take 1 tablet (10 mg total) by mouth 3 (three) times daily.   midodrine 5 MG tablet Commonly known as: PROAMATINE Take 1 tablet (5 mg total) by mouth daily. Take 1 tablet before dialysis treatment   multivitamin Tabs tablet Take 1 tablet by mouth daily.   NovoLOG 100 UNIT/ML injection Generic drug: insulin  aspart INJECT 7 UNITS INTO THE SKIN DAILY WITH SUPPER. What changed: See the new instructions.   oxyCODONE 5 MG immediate release tablet Commonly known as: Roxicodone Take 1-2 tablets every 4-6 hours for severe pain.   pantoprazole 40 MG tablet Commonly known as: PROTONIX Take 40 mg by mouth 2 (two) times daily.   promethazine 12.5 MG tablet Commonly known as: PHENERGAN Take 1 tablet (12.5 mg total) by mouth every 6 (six) hours as needed for nausea or vomiting.   sevelamer carbonate 800 MG tablet Commonly known as: RENVELA Take 1,600-4,000 mg by mouth See admin instructions. 4055m three times daily with meals and 1601mtwice daily with snacks   traMADol 50 MG tablet Commonly known as: ULTRAM Take 1 tablet (50 mg total) by mouth every 6 (six) hours as needed for moderate pain.   warfarin 5 MG tablet Commonly known as: COUMADIN Take as directed. If you are unsure how to take this medication, talk to your nurse or doctor. Original instructions: TAKE AS FOLLOWS: MON-WED-FRI 1&1/2 TABLETS DAILY. ON ALL OTHER DAYS 1 TABLET What changed: additional instructions          Outstanding Labs/Studies   None  Duration of Discharge Encounter   Greater  than 30 minutes including physician time.  Signed, Abigail Butts PA-C 03/22/2019, 3:47 PM

## 2019-03-22 NOTE — Discharge Instructions (Signed)

## 2019-03-22 NOTE — Progress Notes (Signed)
Subjective:  S/p ray amputation of fourth toe yest- due for HD today - INR is 1.9 Objective Vital signs in last 24 hours: Vitals:   03/21/19 1623 03/21/19 2016 03/21/19 2331 03/22/19 0357  BP: 97/82 119/84 119/84 101/85  Pulse: 69 71 73 71  Resp: (!) 9 13 14  (!) 24  Temp: 98 F (36.7 C) 98.3 F (36.8 C) 98.3 F (36.8 C) 97.9 F (36.6 C)  TempSrc: Oral Oral Oral Oral  SpO2: 100% 99% 97% 97%  Weight:    88.8 kg  Height:       Weight change: -0.5 kg  Intake/Output Summary (Last 24 hours) at 03/22/2019 0800 Last data filed at 03/21/2019 1900 Gross per 24 hour  Intake 713.74 ml  Output -  Net 713.74 ml    Dialysis Orders: Center: [facility]  on MWF . EDW 84 kg   HD Bath 2 K, 3 CA  Time 4hr 69mn  Heparin NONE. Access R IJ Perm cath      Hec 3 mcg IV/HD  Venofer 50 mg q wkly hd  NO ESA  Last hgb 10.8 ( was last on Mircera  60 mcg 6/03 )      Assessment/ Plan: Pt is a 53y.o. yo male with ESRD and LVAD who was admitted on 03/15/2019 with ischemic right foot   Assessment/Plan: 1. PAD-  S/p arteriogram and angioplasty and stent to right side- now s/p ray amputation of 4th toe.  Still req IV pain med this AM 2. ESRD - [place] MWF [place]- due later today- will run 4 hours and UF as able   3. Anemia- hgb seems to have dropped with procedure -  giving esa today, already on iron- low but stable   4. Secondary hyperparathyroidism- cont home hectorol and renvela- refusing renvela- no phos- since going home soon, no change for now- last phos 5.4   5. HTN/volume- stable and fine- on midodrine- is over his EDW- more UF today if able  6. LVAD-  [place]. Dispo- he tells me home once his INR is up -  Is 1.9 today   KLouis Meckel   Labs: Basic Metabolic Panel: Recent Labs  Lab 03/19/19 0308 03/20/19 0225 03/21/19 0533  NA 136 133* 133*  K 4.1 4.1 3.7  CL 101 99 95*  CO2 23 20* 23  GLUCOSE 179* 171* 119*  BUN 44* 59* 32*  CREATININE 8.18* 10.64* 7.07*   CALCIUM 8.4* 8.6* 8.6*  PHOS  --   --  5.4*   Liver Function Tests: Recent Labs  Lab 03/15/19 1807  AST 21  ALT 18  ALKPHOS 101  BILITOT 0.7  PROT 7.6  ALBUMIN 3.5   No results for input(s): LIPASE, AMYLASE in the last 168 hours. No results for input(s): AMMONIA in the last 168 hours. CBC: Recent Labs  Lab 03/15/19 1807  03/18/19 0304 03/19/19 0308 03/20/19 0225 03/21/19 0850 03/22/19 0225  WBC 12.7*   < > 14.5* 13.3* 12.9* 14.6* 16.1*  NEUTROABS 9.4*  --   --   --   --   --   --   HGB 11.6*   < > 9.6* 8.6* 8.3* 8.6* 8.9*  HCT 35.3*   < > 30.1* 26.6* 26.2* 27.0* 27.4*  MCV 91.7   < > 93.2 93.7 92.9 92.8 91.6  PLT 197   < > 203 183 181 219 221   < > = values in this interval not  displayed.   Cardiac Enzymes: No results for input(s): CKTOTAL, CKMB, CKMBINDEX, TROPONINI in the last 168 hours. CBG: Recent Labs  Lab 03/21/19 1150 03/21/19 1625 03/21/19 2143 03/21/19 2213 03/22/19 0610  GLUCAP 157* 99 59* 92 122*    [place] Studies: No results for input(s): IRON, TIBC, TRANSFERRIN, FERRITIN in the last 72 hours. Studies/Results: No results found. Medications: Infusions: . sodium chloride    . ferric gluconate (FERRLECIT/NULECIT) IV Stopped (03/20/19 1210)    Scheduled Medications: . amiodarone  200 mg Oral Daily  . aspirin EC  81 mg Oral Daily  . atorvastatin  40 mg Oral q1800  . Chlorhexidine Gluconate Cloth  6 each Topical Q0600  . citalopram  20 mg Oral Daily  . darbepoetin (ARANESP) injection - DIALYSIS  150 mcg Intravenous Q Wed-HD  . doxercalciferol  3 mcg Intravenous Q M,W,F-HD  . dronabinol  2.5 mg Oral BID AC  . erythromycin  250 mg Oral TID  . gabapentin  100 mg Oral BID  . insulin aspart  0-15 Units Subcutaneous TID WC  . insulin aspart  0-5 Units Subcutaneous QHS  . insulin aspart  7 Units Subcutaneous QPM  . insulin glargine  5 Units Subcutaneous Daily  . metoCLOPramide  10 mg Oral TID  . midodrine  5 mg Oral Q M,W,F  . multivitamin  1  tablet Oral Daily  . pantoprazole  40 mg Oral BID  . sevelamer carbonate  4,000 mg Oral TID WC  . sodium chloride flush  10-40 mL Intracatheter Q12H  . sodium chloride flush  3 mL Intravenous Q12H  . warfarin  7.5 mg Oral ONCE-1800  . Warfarin - Pharmacist Dosing Inpatient   Does not apply q1800    have reviewed scheduled and prn medications.  Physical Exam: General: NAD- ate breakfast - pain 6/10 Heart: RRR Lungs: mostly clear Abdomen: soft, non tender Extremities: no edema- right foot bandaged s/p toe amp  Dialysis Access: left sided TDC     03/22/2019,8:00 AM  LOS: 7 days

## 2019-03-22 NOTE — Progress Notes (Signed)
Orthopedic Tech Progress Note Patient Details:  [name] [birth year] [id number]  Ortho Devices Type of Ortho Device: Postop shoe/boot Ortho Device/Splint Location: right Ortho Device/Splint Interventions: Application   Post Interventions Patient Tolerated: Well Instructions Provided: Care of device   [name] 03/22/2019, 5:39 PM

## 2019-03-22 NOTE — Plan of Care (Signed)

## 2019-03-22 NOTE — Progress Notes (Signed)
  Progress Note    03/22/2019 1:03 PM 1 Day Post-Op  Subjective: Evaluated on dialysis still having pain right foot  Vitals:   03/22/19 0357 03/22/19 0853  BP: 101/85   Pulse: 71   Resp: (!) 24 18  Temp: 97.9 F (36.6 C) 98.2 F (36.8 C)  SpO2: 97% 96%    Physical Exam: Awake alert and oriented on dialysis Right foot is warm For toe amputation site clean dry intact with sutures  CBC    Component Value Date/Time   WBC 16.1 (H) 03/22/2019 0225   RBC 2.99 (L) 03/22/2019 0225   HGB 8.9 (L) 03/22/2019 0225   HGB 11.7 (L) 09/07/2016 1457   HCT 27.4 (L) 03/22/2019 0225   HCT 37.5 09/07/2016 1457   PLT 221 03/22/2019 0225   PLT 279 09/07/2016 1457   MCV 91.6 03/22/2019 0225   MCV 81 09/07/2016 1457   MCH 29.8 03/22/2019 0225   MCHC 32.5 03/22/2019 0225   RDW 14.6 03/22/2019 0225   RDW 14.9 09/07/2016 1457   LYMPHSABS 1.7 03/15/2019 1807   LYMPHSABS 1.3 09/07/2016 1457   MONOABS 0.7 03/15/2019 1807   EOSABS 0.6 (H) 03/15/2019 1807   EOSABS 0.2 09/07/2016 1457   BASOSABS 0.0 03/15/2019 1807   BASOSABS 0.0 09/07/2016 1457    BMET    Component Value Date/Time   NA 133 (L) 03/21/2019 0533   NA 144 05/11/2017 0938   K 3.7 03/21/2019 0533   CL 95 (L) 03/21/2019 0533   CO2 23 03/21/2019 0533   GLUCOSE 119 (H) 03/21/2019 0533   BUN 32 (H) 03/21/2019 0533   BUN 68 (H) 05/11/2017 0938   CREATININE 7.07 (H) 03/21/2019 0533   CREATININE 1.63 (H) 11/09/2016 1505   CALCIUM 8.6 (L) 03/21/2019 0533   CALCIUM 8.7 06/27/2017 1152   GFRNONAA 8 (L) 03/21/2019 0533   GFRNONAA 48 (L) 11/09/2016 1505   GFRAA 9 (L) 03/21/2019 0533   GFRAA 55 (L) 11/09/2016 1505    INR    Component Value Date/Time   INR 1.9 (H) 03/22/2019 0225     Intake/Output Summary (Last 24 hours) at 03/22/2019 1303 Last data filed at 03/22/2019 0900 Gross per 24 hour  Intake 443.5 ml  Output -  Net 443.5 ml     Assessment/plan:  53 y.o. male is s/p right lower extremity revascularization  retrograde posterior tibial access.  Had adequate bleeding in 4 to amputation site yesterday.  We will follow-up in 2 to 3 weeks for wound check.  Okay for discharge from vascular standpoint.   Somara Frymire C. Donzetta Matters, MD [facility] Office: [phone] Pager: [phone]  03/22/2019 1:03 PM

## 2019-03-22 NOTE — Progress Notes (Addendum)
Patient ID: [name], male   DOB: 11-11-1965, 53 y.o.   MRN: [id number]   Advanced Heart Failure VAD Team Note  PCP-Cardiologist: No primary care provider on file.   Subjective:    Right foot still hurts and hard to bear weight without surgical boot, s/p right 4th toe amputation on 7/16.    INR 1.9 on warfarin.   Peripheral angiogram 7/13 with 80% stenosis right SFA and occluded right PT (similar disease left SFA).  He had stent right SFA and angioplasty to PT.   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   LVAD INTERROGATION:  HeartMate 3 LVAD:   Flow 3.7 liters/min, speed 5500, power 4.4, PI 5.3.  No PI events today.    Objective:    Vital Signs:   Temp:  [97.9 F (36.6 C)-98.3 F (36.8 C)] 98.2 F (36.8 C) (07/17 0853) Pulse Rate:  [69-80] 71 (07/17 0357) Resp:  [5-24] 18 (07/17 0853) BP: (86-119)/(74-95) 101/85 (07/17 0357) SpO2:  [96 %-100 %] 96 % (07/17 0853) Weight:  [88.8 kg] 88.8 kg (07/17 0357) Last BM Date: 03/21/19 Mean arterial Pressure 80s  Intake/Output:   Intake/Output Summary (Last 24 hours) at 03/22/2019 1206 Last data filed at 03/22/2019 0900 Gross per 24 hour  Intake 561.5 ml  Output -  Net 561.5 ml     Physical Exam    General: Well appearing this am. NAD.  HEENT: Normal. Neck: Supple, JVP 7-8 cm. Carotids OK.  Cardiac:  Mechanical heart sounds with LVAD hum present.  Lungs:  CTAB, normal effort.  Abdomen:  NT, ND, no HSM. No bruits or masses. +BS  LVAD exit site: Well-healed and incorporated. Dressing dry and intact. No erythema or drainage. Stabilization device present and accurately applied. Driveline dressing changed daily per sterile technique. Extremities:  Warm and dry. No cyanosis, clubbing, rash, or edema. Right foot wrapped.  Neuro:  Alert & oriented x 3. Cranial nerves grossly intact. Moves all 4 extremities w/o difficulty. Affect pleasant      Telemetry   NSR 80s (personally reviewed)   Labs   Basic Metabolic  Panel: Recent Labs  Lab 03/17/19 0443 03/18/19 0304 03/19/19 0308 03/20/19 0225 03/21/19 0533  NA 134* 132* 136 133* 133*  K 4.9 4.8 4.1 4.1 3.7  CL 98 96* 101 99 95*  CO2 20* 20* 23 20* 23  GLUCOSE 87 94 179* 171* 119*  BUN 51* 67* 44* 59* 32*  CREATININE 9.06* 10.85* 8.18* 10.64* 7.07*  CALCIUM 9.4 9.1 8.4* 8.6* 8.6*  PHOS  --   --   --   --  5.4*    Liver Function Tests: Recent Labs  Lab 03/15/19 1807  AST 21  ALT 18  ALKPHOS 101  BILITOT 0.7  PROT 7.6  ALBUMIN 3.5   No results for input(s): LIPASE, AMYLASE in the last 168 hours. No results for input(s): AMMONIA in the last 168 hours.  CBC: Recent Labs  Lab 03/15/19 1807  03/18/19 0304 03/19/19 0308 03/20/19 0225 03/21/19 0850 03/22/19 0225  WBC 12.7*   < > 14.5* 13.3* 12.9* 14.6* 16.1*  NEUTROABS 9.4*  --   --   --   --   --   --   HGB 11.6*   < > 9.6* 8.6* 8.3* 8.6* 8.9*  HCT 35.3*   < > 30.1* 26.6* 26.2* 27.0* 27.4*  MCV 91.7   < > 93.2 93.7 92.9 92.8 91.6  PLT 197   < > 203 183 181 219 221   < > =  values in this interval not displayed.    INR: Recent Labs  Lab 03/18/19 0304 03/19/19 0308 03/20/19 0225 03/21/19 0236 03/22/19 0225  INR 1.7* 1.6* 1.4* 1.9* 1.9*    Other results:  EKG:    Imaging   No results found.   Medications:     Scheduled Medications: . amiodarone  200 mg Oral Daily  . aspirin EC  81 mg Oral Daily  . atorvastatin  40 mg Oral q1800  . Chlorhexidine Gluconate Cloth  6 each Topical Q0600  . citalopram  20 mg Oral Daily  . darbepoetin (ARANESP) injection - DIALYSIS  150 mcg Intravenous Q Wed-HD  . doxercalciferol  3 mcg Intravenous Q M,W,F-HD  . dronabinol  2.5 mg Oral BID AC  . erythromycin  250 mg Oral TID  . gabapentin  100 mg Oral BID  . insulin aspart  0-15 Units Subcutaneous TID WC  . insulin aspart  0-5 Units Subcutaneous QHS  . insulin aspart  7 Units Subcutaneous QPM  . insulin glargine  5 Units Subcutaneous Daily  . metoCLOPramide  10 mg Oral TID   . midodrine  5 mg Oral Q M,W,F  . multivitamin  1 tablet Oral Daily  . pantoprazole  40 mg Oral BID  . sevelamer carbonate  4,000 mg Oral TID WC  . sodium chloride flush  10-40 mL Intracatheter Q12H  . sodium chloride flush  3 mL Intravenous Q12H  . warfarin  7.5 mg Oral ONCE-1800  . Warfarin - Pharmacist Dosing Inpatient   Does not apply q1800    Infusions: . sodium chloride    . ferric gluconate (FERRLECIT/NULECIT) IV Stopped (03/20/19 1210)    PRN Medications: sodium chloride, acetaminophen, docusate sodium, fentaNYL (SUBLIMAZE) injection, hydrALAZINE, labetalol, LORazepam, ondansetron (ZOFRAN) IV, promethazine **OR** promethazine, sevelamer carbonate, sodium chloride flush, sodium chloride flush, traMADol    Assessment/Plan:    1. Right foot pain/PAD: Ulcer on 4th toe,  ischemic due to PAD. Peripheral angiogram 7/13 with 80% right SFA stenosis and occluded right PT.  He had stenting of SFA and PTCA of PT with restoration of flow. Right PT pulse now dopperable. However, the toe was still exquisitely painful and he was unable to easily bear weight.  He is now s/p amputation right 4th toe on 7/16, pain improved but now present at surgical site.  - Needs surgical boot to bear weight.  2. Chronic systolic CHF: Ischemic cardiomyopathy. St Jude ICD. NSTEMI in March 2018 with DES to LAD and RCA, complicated by cardiogenic shock, low output requiring milrinone. Echo 8/18 with EF 20-25%, moderate MR. CPX (8/18) with severe functional impairment due to HF. He is s/p HM3 LVAD placement. He had pump thrombosis 6/19 due to [facility] outflow graft kinking. He had pump exchange for another HM3. INR 1.9 today, off heparin and on warfarin.  - Volume managed at dialysis, needs today.   - Continue ASA 81 and warfarin with INR goal 2-2.5.      - He has been referred to [facility] for evaluation for heart/kidney transplantand recently saw Dr Mosetta Pigeon. 3. ESRD: Developed peri-operatively after HM3 placement.  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  -He is currently looking for a living donor. - HD today.    4. CAD: NSTEMI in 3/18. LHC with 99% ulcerated lesion proximal RCA with left to right collaterals, 95% mid LAD stenosis after mid LAD stent. s/p PCI to RCA and LAD on 11/25/16.  -  Continue statin and ASA. 4. h/o DVTs: Factor V Leiden heterozygote. - Anticoagulated.  5. Diabetic gastroparesis: Difficult to manage. He has seen gastroparesis specialist at [facility]. Surprisingly, gastric emptying study was normal. However, electrogastrogram showed poor gastric accomodation/capacity. Therefore, small frequent meals recommended. Recently doing better.   - Continue reglan 5 mg tid/ac and Marinol.  - Continue Protonix bid.  - He will continue erythromycinindefinitely.  6. DM: Cover with SSI and home glargine.  7. HTN:MAP has been lower with dialysis, now off Bidil and getting midodrine prior to HD.  8. Atrial flutter: Paroxysmal. He has been in NSR recently.  - Continue amiodarone. 9. Left hand tingling and grip weakness: Present since he developed left arm hematoma from failed AV graft. 10. Difficult IV access: Placed IJ.  11. ID: WBCs up but no fever.  May be stress reaction with surgery yesterday.  Observe.   He can go home today after HD and getting surgical boot.  He will need followup in LVAD clinic, will arrange.  No changes to prior to admission medications.  Only addition will be oxycodone 5-10 mg q6 hrs prn foot pain, give 15 tabs only.   I reviewed the LVAD parameters from today, and compared the results to the patient's prior recorded data.  No programming changes were made.  The LVAD is functioning within specified parameters.  The patient performs LVAD self-test daily.  LVAD interrogation was negative for any significant power changes, alarms or PI events/speed drops.  LVAD equipment check completed and is in good  working order.  Back-up equipment present.   LVAD education done on emergency procedures and precautions and reviewed exit site care.  Length of Stay: 7  Loralie Champagne, MD 03/22/2019, 12:06 PM  VAD Team --- VAD ISSUES ONLY--- Pager [phone] (7am - 7am)  Advanced Heart Failure Team  Pager [phone] (M-F; 7a - 4p)  Please contact [facility] for night-coverage after hours (4p -7a ) and weekends on [email or web address]

## 2019-03-25 ENCOUNTER — Telehealth (HOSPITAL_COMMUNITY): Payer: Self-pay | Admitting: *Deleted

## 2019-03-25 NOTE — Telephone Encounter (Signed)
Called [place] at [facility] re: patients' home INR machine. Per Bonnita Nasuti, PharmD, pt's home machine is off from lab stick performed here last week prior to discharge. Home INR machine read INR of 3.1, while lab stick read 1.9.  Informed Marita [place] of above; we have faxed order to [facility] asking for INR check through blood draw there Wed, 03/27/19. We will continue to have Dialysis Center check until [facility] can re-calibrate or replace pt's home INR machine.  Asked Marita [place] to call VAD office when she has answer/solution. She verbalized understanding of above.  Zada Girt RN, [place] Coordinator [phone]

## 2019-03-26 ENCOUNTER — Encounter (HOSPITAL_COMMUNITY): Payer: Self-pay

## 2019-03-27 ENCOUNTER — Inpatient Hospital Stay (HOSPITAL_COMMUNITY): Payer: BC Managed Care – PPO

## 2019-03-27 ENCOUNTER — Inpatient Hospital Stay (HOSPITAL_COMMUNITY)
Admission: EM | Admit: 2019-03-27 | Discharge: 2019-04-04 | DRG: 073 | Disposition: A | Discharge status: Home / Self Care | Payer: BC Managed Care – PPO | Attending: Internal Medicine | Admitting: Internal Medicine

## 2019-03-27 ENCOUNTER — Other Ambulatory Visit: Payer: Self-pay

## 2019-03-27 DIAGNOSIS — I255 Ischemic cardiomyopathy: Secondary | ICD-10-CM | POA: Diagnosis present

## 2019-03-27 DIAGNOSIS — I953 Hypotension of hemodialysis: Secondary | ICD-10-CM | POA: Diagnosis not present

## 2019-03-27 DIAGNOSIS — Z95810 Presence of automatic (implantable) cardiac defibrillator: Secondary | ICD-10-CM

## 2019-03-27 DIAGNOSIS — K219 Gastro-esophageal reflux disease without esophagitis: Secondary | ICD-10-CM | POA: Diagnosis present

## 2019-03-27 DIAGNOSIS — D649 Anemia, unspecified: Secondary | ICD-10-CM | POA: Diagnosis not present

## 2019-03-27 DIAGNOSIS — Z992 Dependence on renal dialysis: Secondary | ICD-10-CM

## 2019-03-27 DIAGNOSIS — Z89421 Acquired absence of other right toe(s): Secondary | ICD-10-CM | POA: Diagnosis not present

## 2019-03-27 DIAGNOSIS — F1120 Opioid dependence, uncomplicated: Secondary | ICD-10-CM | POA: Diagnosis present

## 2019-03-27 DIAGNOSIS — E1143 Type 2 diabetes mellitus with diabetic autonomic (poly)neuropathy: Secondary | ICD-10-CM | POA: Diagnosis present

## 2019-03-27 DIAGNOSIS — D62 Acute posthemorrhagic anemia: Secondary | ICD-10-CM | POA: Diagnosis present

## 2019-03-27 DIAGNOSIS — I5022 Chronic systolic (congestive) heart failure: Secondary | ICD-10-CM | POA: Diagnosis present

## 2019-03-27 DIAGNOSIS — Z452 Encounter for adjustment and management of vascular access device: Secondary | ICD-10-CM

## 2019-03-27 DIAGNOSIS — Z9862 Peripheral vascular angioplasty status: Secondary | ICD-10-CM | POA: Diagnosis not present

## 2019-03-27 DIAGNOSIS — E1122 Type 2 diabetes mellitus with diabetic chronic kidney disease: Secondary | ICD-10-CM | POA: Diagnosis present

## 2019-03-27 DIAGNOSIS — E1151 Type 2 diabetes mellitus with diabetic peripheral angiopathy without gangrene: Secondary | ICD-10-CM | POA: Diagnosis present

## 2019-03-27 DIAGNOSIS — I132 Hypertensive heart and chronic kidney disease with heart failure and with stage 5 chronic kidney disease, or end stage renal disease: Secondary | ICD-10-CM | POA: Diagnosis present

## 2019-03-27 DIAGNOSIS — K5090 Crohn's disease, unspecified, without complications: Secondary | ICD-10-CM | POA: Diagnosis present

## 2019-03-27 DIAGNOSIS — K5903 Drug induced constipation: Secondary | ICD-10-CM | POA: Diagnosis present

## 2019-03-27 DIAGNOSIS — M549 Dorsalgia, unspecified: Secondary | ICD-10-CM | POA: Diagnosis not present

## 2019-03-27 DIAGNOSIS — Z7982 Long term (current) use of aspirin: Secondary | ICD-10-CM

## 2019-03-27 DIAGNOSIS — N186 End stage renal disease: Secondary | ICD-10-CM | POA: Diagnosis present

## 2019-03-27 DIAGNOSIS — Z794 Long term (current) use of insulin: Secondary | ICD-10-CM

## 2019-03-27 DIAGNOSIS — I998 Other disorder of circulatory system: Secondary | ICD-10-CM | POA: Diagnosis not present

## 2019-03-27 DIAGNOSIS — Z823 Family history of stroke: Secondary | ICD-10-CM

## 2019-03-27 DIAGNOSIS — Z7901 Long term (current) use of anticoagulants: Secondary | ICD-10-CM

## 2019-03-27 DIAGNOSIS — K3184 Gastroparesis: Secondary | ICD-10-CM | POA: Diagnosis present

## 2019-03-27 DIAGNOSIS — I480 Paroxysmal atrial fibrillation: Secondary | ICD-10-CM | POA: Diagnosis present

## 2019-03-27 DIAGNOSIS — Z833 Family history of diabetes mellitus: Secondary | ICD-10-CM

## 2019-03-27 DIAGNOSIS — I252 Old myocardial infarction: Secondary | ICD-10-CM | POA: Diagnosis not present

## 2019-03-27 DIAGNOSIS — E7800 Pure hypercholesterolemia, unspecified: Secondary | ICD-10-CM | POA: Diagnosis present

## 2019-03-27 DIAGNOSIS — Z20828 Contact with and (suspected) exposure to other viral communicable diseases: Secondary | ICD-10-CM | POA: Diagnosis present

## 2019-03-27 DIAGNOSIS — D6851 Activated protein C resistance: Secondary | ICD-10-CM | POA: Diagnosis present

## 2019-03-27 DIAGNOSIS — I739 Peripheral vascular disease, unspecified: Secondary | ICD-10-CM

## 2019-03-27 DIAGNOSIS — I950 Idiopathic hypotension: Secondary | ICD-10-CM | POA: Diagnosis not present

## 2019-03-27 DIAGNOSIS — I2510 Atherosclerotic heart disease of native coronary artery without angina pectoris: Secondary | ICD-10-CM | POA: Diagnosis present

## 2019-03-27 DIAGNOSIS — I4892 Unspecified atrial flutter: Secondary | ICD-10-CM | POA: Diagnosis present

## 2019-03-27 DIAGNOSIS — R112 Nausea with vomiting, unspecified: Secondary | ICD-10-CM | POA: Diagnosis present

## 2019-03-27 DIAGNOSIS — E1169 Type 2 diabetes mellitus with other specified complication: Secondary | ICD-10-CM | POA: Diagnosis present

## 2019-03-27 DIAGNOSIS — T40605A Adverse effect of unspecified narcotics, initial encounter: Secondary | ICD-10-CM | POA: Diagnosis present

## 2019-03-27 DIAGNOSIS — Z888 Allergy status to other drugs, medicaments and biological substances status: Secondary | ICD-10-CM | POA: Diagnosis not present

## 2019-03-27 DIAGNOSIS — Z95811 Presence of heart assist device: Secondary | ICD-10-CM

## 2019-03-27 DIAGNOSIS — M545 Low back pain: Secondary | ICD-10-CM | POA: Diagnosis not present

## 2019-03-27 DIAGNOSIS — Z8249 Family history of ischemic heart disease and other diseases of the circulatory system: Secondary | ICD-10-CM

## 2019-03-27 DIAGNOSIS — M869 Osteomyelitis, unspecified: Secondary | ICD-10-CM | POA: Diagnosis present

## 2019-03-27 DIAGNOSIS — M79671 Pain in right foot: Secondary | ICD-10-CM | POA: Diagnosis not present

## 2019-03-27 DIAGNOSIS — Z95820 Peripheral vascular angioplasty status with implants and grafts: Secondary | ICD-10-CM

## 2019-03-27 DIAGNOSIS — I96 Gangrene, not elsewhere classified: Secondary | ICD-10-CM | POA: Diagnosis not present

## 2019-03-27 DIAGNOSIS — Z955 Presence of coronary angioplasty implant and graft: Secondary | ICD-10-CM

## 2019-03-27 LAB — CBC WITH DIFFERENTIAL/PLATELET
Abs Immature Granulocytes: 0.26 10*3/uL — ABNORMAL HIGH (ref 0.00–0.07)
Basophils Absolute: 0 10*3/uL (ref 0.0–0.1)
Basophils Relative: 0 %
Eosinophils Absolute: 0.1 10*3/uL (ref 0.0–0.5)
Eosinophils Relative: 1 %
HCT: 31.5 % — ABNORMAL LOW (ref 39.0–52.0)
Hemoglobin: 9.9 g/dL — ABNORMAL LOW (ref 13.0–17.0)
Immature Granulocytes: 2 %
Lymphocytes Relative: 6 %
Lymphs Abs: 1 10*3/uL (ref 0.7–4.0)
MCH: 29.8 pg (ref 26.0–34.0)
MCHC: 31.4 g/dL (ref 30.0–36.0)
MCV: 94.9 fL (ref 80.0–100.0)
Monocytes Absolute: 0.9 10*3/uL (ref 0.1–1.0)
Monocytes Relative: 5 %
Neutro Abs: 14.9 10*3/uL — ABNORMAL HIGH (ref 1.7–7.7)
Neutrophils Relative %: 86 %
Platelets: 322 10*3/uL (ref 150–400)
RBC: 3.32 MIL/uL — ABNORMAL LOW (ref 4.22–5.81)
RDW: 15.6 % — ABNORMAL HIGH (ref 11.5–15.5)
WBC: 17.1 10*3/uL — ABNORMAL HIGH (ref 4.0–10.5)
nRBC: 0.2 % (ref 0.0–0.2)

## 2019-03-27 LAB — COMPREHENSIVE METABOLIC PANEL
ALT: 9 U/L (ref 0–44)
AST: 28 U/L (ref 15–41)
Albumin: 3.1 g/dL — ABNORMAL LOW (ref 3.5–5.0)
Alkaline Phosphatase: 105 U/L (ref 38–126)
Anion gap: 17 — ABNORMAL HIGH (ref 5–15)
BUN: 14 mg/dL (ref 6–20)
CO2: 23 mmol/L (ref 22–32)
Calcium: 9.8 mg/dL (ref 8.9–10.3)
Chloride: 95 mmol/L — ABNORMAL LOW (ref 98–111)
Creatinine, Ser: 4.66 mg/dL — ABNORMAL HIGH (ref 0.61–1.24)
GFR calc Af Amer: 15 mL/min — ABNORMAL LOW (ref >60)
GFR calc non Af Amer: 13 mL/min — ABNORMAL LOW (ref >60)
Glucose, Bld: 114 mg/dL — ABNORMAL HIGH (ref 70–99)
Potassium: 4 mmol/L (ref 3.5–5.1)
Sodium: 135 mmol/L (ref 135–145)
Total Bilirubin: 0.7 mg/dL (ref 0.3–1.2)
Total Protein: 8.3 g/dL — ABNORMAL HIGH (ref 6.5–8.1)

## 2019-03-27 LAB — PROTIME-INR
INR: 3.8 — ABNORMAL HIGH (ref 0.8–1.2)
INR: 3.3 — ABNORMAL HIGH (ref 0.8–1.2)
Prothrombin Time: 36.9 seconds — ABNORMAL HIGH (ref 11.4–15.2)
Prothrombin Time: 33.3 seconds — ABNORMAL HIGH (ref 11.4–15.2)

## 2019-03-27 LAB — CBC
HCT: 29.3 % — ABNORMAL LOW (ref 39.0–52.0)
Hemoglobin: 9.3 g/dL — ABNORMAL LOW (ref 13.0–17.0)
MCH: 29.9 pg (ref 26.0–34.0)
MCHC: 31.7 g/dL (ref 30.0–36.0)
MCV: 94.2 fL (ref 80.0–100.0)
Platelets: 307 10*3/uL (ref 150–400)
RBC: 3.11 MIL/uL — ABNORMAL LOW (ref 4.22–5.81)
RDW: 15.4 % (ref 11.5–15.5)
WBC: 15.8 10*3/uL — ABNORMAL HIGH (ref 4.0–10.5)
nRBC: 0.3 % — ABNORMAL HIGH (ref 0.0–0.2)

## 2019-03-27 LAB — GLUCOSE, CAPILLARY: Glucose-Capillary: 132 mg/dL — ABNORMAL HIGH (ref 70–99)

## 2019-03-27 LAB — SARS CORONAVIRUS 2 BY RT PCR (HOSPITAL ORDER, PERFORMED IN ~~LOC~~ HOSPITAL LAB): SARS Coronavirus 2: NEGATIVE

## 2019-03-27 LAB — LACTATE DEHYDROGENASE: LDH: 270 U/L — ABNORMAL HIGH (ref 98–192)

## 2019-03-27 LAB — LACTIC ACID, PLASMA: Lactic Acid, Venous: 1.4 mmol/L (ref 0.5–1.9)

## 2019-03-27 MED ORDER — PROMETHAZINE HCL 25 MG/ML IJ SOLN
25.0000 mg | Freq: Four times a day (QID) | INTRAMUSCULAR | Status: DC | PRN
  Administered 2019-03-27: 25 mg via INTRAVENOUS
  Filled 2019-03-27: qty 1

## 2019-03-27 MED ORDER — ASPIRIN EC 81 MG PO TBEC
81.0000 mg | DELAYED_RELEASE_TABLET | Freq: Every day | ORAL | Status: DC
  Administered 2019-03-28 – 2019-04-04 (×8): 81 mg via ORAL
  Filled 2019-03-27 (×8): qty 1

## 2019-03-27 MED ORDER — LORAZEPAM 2 MG/ML IJ SOLN
0.5000 mg | Freq: Once | INTRAMUSCULAR | Status: AC
  Administered 2019-03-27: 0.5 mg via INTRAVENOUS
  Filled 2019-03-27: qty 1

## 2019-03-27 MED ORDER — "THROMBI-PAD 3""X3"" EX PADS"
1.0000 | MEDICATED_PAD | Freq: Once | CUTANEOUS | Status: AC
  Administered 2019-03-28: 1 via TOPICAL
  Filled 2019-03-27: qty 1

## 2019-03-27 MED ORDER — OXYCODONE HCL 5 MG PO TABS
5.0000 mg | ORAL_TABLET | ORAL | Status: DC | PRN
  Administered 2019-03-29 – 2019-04-03 (×8): 5 mg via ORAL
  Filled 2019-03-27 (×8): qty 1

## 2019-03-27 MED ORDER — DOCUSATE SODIUM 100 MG PO CAPS
100.0000 mg | ORAL_CAPSULE | Freq: Every day | ORAL | Status: DC | PRN
  Administered 2019-03-28: 100 mg via ORAL
  Filled 2019-03-27: qty 1

## 2019-03-27 MED ORDER — HYDRALAZINE HCL 20 MG/ML IJ SOLN
5.0000 mg | INTRAMUSCULAR | Status: DC | PRN
  Administered 2019-03-30 – 2019-04-03 (×5): 5 mg via INTRAVENOUS
  Filled 2019-03-27 (×7): qty 1

## 2019-03-27 MED ORDER — DRONABINOL 2.5 MG PO CAPS
2.5000 mg | ORAL_CAPSULE | Freq: Two times a day (BID) | ORAL | Status: DC
  Administered 2019-03-28 – 2019-04-03 (×13): 2.5 mg via ORAL
  Filled 2019-03-27 (×13): qty 1

## 2019-03-27 MED ORDER — GABAPENTIN 100 MG PO CAPS
100.0000 mg | ORAL_CAPSULE | Freq: Three times a day (TID) | ORAL | Status: DC
  Administered 2019-03-28 – 2019-04-04 (×20): 100 mg via ORAL
  Filled 2019-03-27 (×22): qty 1

## 2019-03-27 MED ORDER — ATORVASTATIN CALCIUM 40 MG PO TABS: 40.0000 mg | ORAL_TABLET | Freq: Every day | ORAL | Status: DC

## 2019-03-27 MED ORDER — RENA-VITE PO TABS
1.0000 | ORAL_TABLET | Freq: Every day | ORAL | Status: DC
  Administered 2019-03-28 – 2019-04-03 (×7): 1 via ORAL
  Filled 2019-03-27 (×8): qty 1

## 2019-03-27 MED ORDER — INSULIN ASPART 100 UNIT/ML ~~LOC~~ SOLN: 7.0000 [IU] | Freq: Every day | SUBCUTANEOUS | Status: DC

## 2019-03-27 MED ORDER — FENTANYL CITRATE (PF) 100 MCG/2ML IJ SOLN: 25.0000 ug | INTRAMUSCULAR | Status: DC

## 2019-03-27 MED ORDER — PROMETHAZINE HCL 25 MG RE SUPP
25.0000 mg | Freq: Four times a day (QID) | RECTAL | Status: DC | PRN
  Filled 2019-03-27: qty 1

## 2019-03-27 MED ORDER — ERYTHROMYCIN BASE 250 MG PO TABS: 250.0000 mg | ORAL_TABLET | Freq: Three times a day (TID) | ORAL | Status: DC

## 2019-03-27 MED ORDER — CALCITRIOL 0.25 MCG PO CAPS
0.5000 ug | ORAL_CAPSULE | ORAL | Status: DC
  Administered 2019-03-29 – 2019-04-03 (×3): 0.5 ug via ORAL
  Filled 2019-03-27 (×4): qty 2

## 2019-03-27 MED ORDER — ASPIRIN EC 81 MG PO TBEC: 81.0000 mg | DELAYED_RELEASE_TABLET | Freq: Every day | ORAL | Status: DC

## 2019-03-27 MED ORDER — "THROMBI-PAD 3""X3"" EX PADS"
1.0000 | MEDICATED_PAD | Freq: Once | CUTANEOUS | Status: AC
  Administered 2019-03-27: 1 via TOPICAL
  Filled 2019-03-27 (×2): qty 1

## 2019-03-27 MED ORDER — ONDANSETRON HCL 4 MG/2ML IJ SOLN
4.0000 mg | Freq: Four times a day (QID) | INTRAMUSCULAR | Status: DC | PRN
  Administered 2019-03-28 – 2019-03-30 (×3): 4 mg via INTRAVENOUS
  Filled 2019-03-27 (×3): qty 2

## 2019-03-27 MED ORDER — CITALOPRAM HYDROBROMIDE 20 MG PO TABS: 20.0000 mg | ORAL_TABLET | Freq: Every day | ORAL | Status: DC

## 2019-03-27 MED ORDER — PROMETHAZINE HCL 25 MG/ML IJ SOLN
25.0000 mg | Freq: Once | INTRAMUSCULAR | Status: AC
  Administered 2019-03-27: 25 mg via INTRAMUSCULAR
  Filled 2019-03-27: qty 1

## 2019-03-27 MED ORDER — FENTANYL CITRATE (PF) 100 MCG/2ML IJ SOLN
50.0000 ug | Freq: Once | INTRAMUSCULAR | Status: DC
  Filled 2019-03-27: qty 2

## 2019-03-27 MED ORDER — TRAMADOL HCL 50 MG PO TABS
50.0000 mg | ORAL_TABLET | Freq: Four times a day (QID) | ORAL | Status: DC | PRN
  Administered 2019-03-30 – 2019-04-03 (×5): 50 mg via ORAL
  Filled 2019-03-27 (×5): qty 1

## 2019-03-27 MED ORDER — ACETAMINOPHEN 325 MG PO TABS
650.0000 mg | ORAL_TABLET | ORAL | Status: DC | PRN
  Administered 2019-03-27: 650 mg via ORAL
  Filled 2019-03-27: qty 2

## 2019-03-27 MED ORDER — PROMETHAZINE HCL 25 MG PO TABS: 12.5000 mg | ORAL_TABLET | Freq: Four times a day (QID) | ORAL | Status: DC | PRN

## 2019-03-27 MED ORDER — LORAZEPAM 2 MG/ML IJ SOLN
0.5000 mg | INTRAMUSCULAR | Status: DC | PRN
  Administered 2019-03-28: 0.5 mg via INTRAVENOUS
  Filled 2019-03-27: qty 1

## 2019-03-27 MED ORDER — INSULIN GLARGINE 100 UNIT/ML ~~LOC~~ SOLN
5.0000 [IU] | Freq: Every day | SUBCUTANEOUS | Status: DC
  Administered 2019-03-27 – 2019-04-03 (×8): 5 [IU] via SUBCUTANEOUS
  Filled 2019-03-27 (×10): qty 0.05

## 2019-03-27 MED ORDER — METOCLOPRAMIDE HCL 10 MG PO TABS: 10.0000 mg | ORAL_TABLET | Freq: Three times a day (TID) | ORAL | Status: DC

## 2019-03-27 MED ORDER — ACETAMINOPHEN 325 MG PO TABS: 650.0000 mg | ORAL_TABLET | ORAL | Status: DC | PRN

## 2019-03-27 MED ORDER — FENTANYL CITRATE (PF) 100 MCG/2ML IJ SOLN
25.0000 ug | INTRAMUSCULAR | Status: DC | PRN
  Administered 2019-03-27: 25 ug via INTRAVENOUS

## 2019-03-27 MED ORDER — LORAZEPAM 0.5 MG PO TABS: 0.5000 mg | ORAL_TABLET | Freq: Two times a day (BID) | ORAL | Status: DC | PRN

## 2019-03-27 MED ORDER — PROMETHAZINE HCL 25 MG PO TABS
25.0000 mg | ORAL_TABLET | ORAL | Status: DC | PRN
  Filled 2019-03-27: qty 1

## 2019-03-27 NOTE — Progress Notes (Signed)
ANTICOAGULATION CONSULT NOTE - Initial Consult  Pharmacy Consult for warfarin Indication: LVAD  Allergies  Allergen Reactions  . Metformin And Related Diarrhea    Patient Measurements: Wt: 85.4 kg Ht: 5'7"  Vital Signs: Temp: 98.4 F (36.9 C) (07/22 1349) Temp Source: Oral (07/22 1349) BP: 148/111 (07/22 1415) Pulse Rate: 72 (07/22 1349)  Labs: Recent Labs    03/27/19 1351  HGB 9.9*  HCT 31.5*  PLT 322  LABPROT 36.9*  INR 3.8*  CREATININE 4.66*    Estimated Creatinine Clearance: 18.9 mL/min (A) (by C-G formula based on SCr of 4.66 mg/dL (H)).   Medical History: Past Medical History:  Diagnosis Date  . Angina   . ASCVD (arteriosclerotic cardiovascular disease)    , Anterior infarction 2005, LAD diagonal bifurcation intervention 03/2004  . Automatic implantable cardiac defibrillator -St. Jude's       . Benign neoplasm of colon   . CHF (congestive heart failure) ([place])   . Chronic systolic heart failure ([place])   . Coronary artery disease     Widely patent previously placed stents in the left anterior   . Crohn's disease ([place])   . 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 Dialysis patient ([place])   . Dyspnea   . Gastroparesis   . GERD (gastroesophageal reflux disease)   . High cholesterol   . Hyperlipidemia   . Hypersomnolent    Previous diagnosis of narcolepsy  . Hypertension, essential   . Ischemic cardiomyopathy    Ejection fraction 15-20% catheterization 2010  . Type II or unspecified type diabetes mellitus without mention of complication, not stated as uncontrolled   . Unspecified gastritis and gastroduodenitis without mention of hemorrhage     Medications:  Scheduled:  . fentaNYL (SUBLIMAZE) injection  50 mcg Intravenous Once    Assessment: 40 yom presenting with N/V and abdominal pain. On warfarin PTA for LVAD/Factor 5 Leiden. PTA regimen is 5 mg daily except 7.5 mg on Tuesday/Thursday. Last dose was on 7/20.  INR today is 3.8.  Hgb 9.9, plt 322. LDH 270. No s/sx of bleeding.  Goal of Therapy:  INR 2-3 Monitor platelets by anticoagulation protocol: Yes   Plan:  Hold warfarin dosing tonight given supratherapeutic INR Monitor daily INR, CBC, and for s/sx of bleeding.   Antonietta Jewel, PharmD, [place] Clinical Pharmacist  Pager: [phone] Phone: [phone] 03/27/2019,3:49 PM

## 2019-03-27 NOTE — ED Notes (Addendum)
ED TO INPATIENT HANDOFF REPORT  ED Nurse Name and Phone #: Caprice Kluver [phone]  S Name/Age/Gender [name] 53 y.o. male Room/Bed: 027C/027C  Code Status   Code Status: Prior  Home/SNF/Other Home Patient oriented to: self, place, time and situation Is this baseline? Yes   Triage Complete: Triage complete  Chief Complaint LVAD  Triage Note Pt is LVAD pt. Pt reports nausea/vomiting and R lower abdominal pain since yesterday. Pt sent over by PCP.    Allergies Allergies  Allergen Reactions  . Metformin And Related Diarrhea    Level of Care/Admitting Diagnosis ED Disposition    ED Disposition Condition [facility]: [place] [[id number]]  Level of Care: ICU [6]  Covid Evaluation: Person Under Investigation (PUI)  Diagnosis: Nausea & vomiting [[id number]]  Admitting Physician: Jolaine Artist [[id number]]  Attending Physician: Jolaine Artist [[id number]]  Estimated length of stay: 5 - 7 days  Certification:: I certify this patient will need inpatient services for at least 2 midnights  Bed request comments: [facility] 2h  PT Class (Do Not Modify): Inpatient [101]  PT Acc Code (Do Not Modify): Private [1]       B Medical/Surgery History Past Medical History:  Diagnosis Date  . Angina   . ASCVD (arteriosclerotic cardiovascular disease)    , Anterior infarction 2005, LAD diagonal bifurcation intervention 03/2004  . Automatic implantable cardiac defibrillator -St. Jude's       . Benign neoplasm of colon   . CHF (congestive heart failure) ([place])   . Chronic systolic heart failure ([place])   . Coronary artery disease     Widely patent previously placed stents in the left anterior   . Crohn's disease ([place])   . Deep venous thrombosis ([facility])    Recurrent-on Coumadin  . Dialysis patient ([place])   . Dyspnea   . Gastroparesis   . GERD (gastroesophageal reflux disease)   . High cholesterol   . Hyperlipidemia   . Hypersomnolent    Previous  diagnosis of narcolepsy  . Hypertension, essential   . Ischemic cardiomyopathy    Ejection fraction 15-20% catheterization 2010  . Type II or unspecified type diabetes mellitus without mention of complication, not stated as uncontrolled   . Unspecified gastritis and gastroduodenitis without mention of hemorrhage    Past Surgical History:  Procedure Laterality Date  . A/V SHUNTOGRAM N/A 05/24/2018   Procedure: A/V SHUNTOGRAM;  Surgeon: Marty Heck, MD;  Location: [facility];  Service: Cardiovascular;  Laterality: N/A;  . ABDOMINAL AORTOGRAM W/LOWER EXTREMITY N/A 03/18/2019   Procedure: ABDOMINAL AORTOGRAM W/LOWER EXTREMITY;  Surgeon: Waynetta Sandy, MD;  Location: [facility];  Service: Cardiovascular;  Laterality: N/A;  . AMPUTATION Right 03/21/2019   Procedure: AMPUTATION DIGIT RIGHT FOURTH TOE;  Surgeon: Waynetta Sandy, MD;  Location: [facility];  Service: Vascular;  Laterality: Right;  . APPENDECTOMY    . AV FISTULA PLACEMENT Right 06/26/2017   Procedure: ARTERIOVENOUS (AV) FISTULA CREATION VERSUS GRAFT RIGHT  ARM;  Surgeon: Conrad Loma, MD;  Location: [facility];  Service: Vascular;  Laterality: Right;  . AV FISTULA PLACEMENT Right 10/31/2017   Procedure: INSERTION OF ARTERIOVENOUS (AV) ARTEGRAFT,  RIGHT UPPER ARM;  Surgeon: Conrad Trilby, MD;  Location: [facility];  Service: Vascular;  Laterality: Right;  . AV FISTULA PLACEMENT Left 05/29/2018   Procedure: ARTERIOVENOUS (AV) FISTULA CREATION  LEFT UPPER EXTREMITY;  Surgeon: Waynetta Sandy, MD;  Location: [place] [facility];  Service: Vascular;  Laterality: Left;  . AV FISTULA PLACEMENT Left 08/09/2018   Procedure: INSERTION OF ARTERIOVENOUS (AV) GORE-TEX GRAFT LEFT UPPER ARM;  Surgeon: Waynetta Sandy, MD;  Location: [facility];  Service: Vascular;  Laterality: Left;  . CARDIAC DEFIBRILLATOR PLACEMENT  2010   St. Jude ICD  . CENTRAL LINE INSERTION Right 05/24/2018   Procedure: CENTRAL LINE INSERTION;   Surgeon: Marty Heck, MD;  Location: [facility];  Service: Cardiovascular;  Laterality: Right;  . COLECTOMY  ~ 2003   "for Crohn's" ascending colon.    . CORONARY STENT INTERVENTION N/A 11/25/2016   Procedure: Coronary Stent Intervention;  Surgeon: Leonie Man, MD;  Location: [facility];  Service: Cardiovascular;  Laterality: N/A;  . EP IMPLANTABLE DEVICE N/A 09/14/2016   Procedure: ICD Generator Changeout;  Surgeon: Deboraha Sprang, MD;  Location: [facility];  Service: Cardiovascular;  Laterality: N/A;  . ESOPHAGOGASTRODUODENOSCOPY N/A 07/12/2017   Procedure: ESOPHAGOGASTRODUODENOSCOPY (EGD);  Surgeon: Jerene Bears, MD;  Location: [facility];  Service: Gastroenterology;  Laterality: N/A;  VAD pt so VAD team will need to accompany pt.   [name] FETAL SURGERY FOR CONGENITAL HERNIA     ????? pt has no knowledge of this..    . FISTULOGRAM Left 08/09/2018   Procedure: FISTULOGRAM LEFT ARM;  Surgeon: Waynetta Sandy, MD;  Location: [place];  Service: Vascular;  Laterality: Left;  . INGUINAL HERNIA REPAIR    . INSERTION OF DIALYSIS CATHETER Right 06/26/2017   Procedure: INSERTION OF TUNNELED  DIALYSIS CATHETER RIGHT INTERNAL JUGULAR;  Surgeon: Conrad R[place]ville, MD;  Location: [place];  Service: Vascular;  Laterality: Right;  . INSERTION OF IMPLANTABLE LEFT VENTRICULAR ASSIST DEVICE N/A 06/12/2017   Procedure: INSERTION OF IMPLANTABLE LEFT VENTRICULAR ASSIST [place] 3;  Surgeon: Ivin Poot, MD;  Location: [place];  Service: Open Heart Surgery;  Laterality: N/A;  HM3 LVAD  CIRC ARREST  NITRIC OXIDE  . INSERTION OF IMPLANTABLE LEFT VENTRICULAR ASSIST DEVICE N/A 02/28/2018   Procedure: REDO INSERTION OF IMPLANTABLE LEFT VENTRICULAR ASSIST DEVICE - HEARTMATE 3;  Surgeon: Ivin Poot, MD;  Location: [place] [facility];  Service: Open Heart Surgery;  Laterality: N/A;  . IR REMOVAL TUN CV CATH W/O FL  02/26/2018  . IR THORACENTESIS ASP PLEURAL SPACE W/IMG GUIDE   06/28/2017  . LIGATION OF ARTERIOVENOUS  FISTULA Right 10/31/2017   Procedure: LIGATION OF BRACHIO-BASILIC VEIN TRANSPOSITION RIGHT ARM;  Surgeon: Conrad Osgood, MD;  Location: [place];  Service: Vascular;  Laterality: Right;  . MULTIPLE EXTRACTIONS WITH ALVEOLOPLASTY N/A 06/08/2017   Procedure: MULTIPLE EXTRACTION WITH ALVEOLOPLASTY AND PRE PROSTHETIC SURGERY AS NEEDED;  Surgeon: Lenn Cal, DDS;  Location: [facility];  Service: Oral Surgery;  Laterality: N/A;  . PERIPHERAL VASCULAR BALLOON ANGIOPLASTY  03/18/2019   Procedure: PERIPHERAL VASCULAR BALLOON ANGIOPLASTY;  Surgeon: Waynetta Sandy, MD;  Location: [place] [facility];  Service: Cardiovascular;;  RT PT  . PERIPHERAL VASCULAR INTERVENTION  03/18/2019   Procedure: PERIPHERAL VASCULAR INTERVENTION;  Surgeon: Waynetta Sandy, MD;  Location: [facility];  Service: Cardiovascular;;  RT SFA  . RIGHT HEART CATH N/A 06/07/2017   Procedure: RIGHT HEART CATH;  Surgeon: Larey Dresser, MD;  Location: [facility];  Service: Cardiovascular;  Laterality: N/A;  . RIGHT HEART CATH N/A 02/08/2018   Procedure: RIGHT HEART CATH;  Surgeon: Larey Dresser, MD;  Location: [facility] [facility];  Service: Cardiovascular;  Laterality: N/A;  . RIGHT HEART CATH N/A 08/09/2018   Procedure: RIGHT HEART CATH;  Surgeon: Larey Dresser, MD;  Location: [facility];  Service: Cardiovascular;  Laterality: N/A;  . RIGHT/LEFT HEART CATH AND CORONARY ANGIOGRAPHY N/A 11/23/2016   Procedure: Right/Left Heart Cath and Coronary Angiography;  Surgeon: Larey Dresser, MD;  Location: [facility];  Service: Cardiovascular;  Laterality: N/A;  . TEE WITHOUT CARDIOVERSION N/A 06/12/2017   Procedure: TRANSESOPHAGEAL ECHOCARDIOGRAM (TEE);  Surgeon: Prescott Gum, Collier Salina, MD;  Location: [place];  Service: Open Heart Surgery;  Laterality: N/A;  . TEE WITHOUT CARDIOVERSION N/A 02/28/2018   Procedure: TRANSESOPHAGEAL ECHOCARDIOGRAM (TEE);  Surgeon: Prescott Gum, Collier Salina, MD;  Location: [place];  Service: Open Heart Surgery;  Laterality: N/A;  . TEMPORARY DIALYSIS CATHETER Left 05/24/2018   Procedure: TEMPORARY DIALYSIS CATHETER;  Surgeon: Marty Heck, MD;  Location: [place] [facility];  Service: Cardiovascular;  Laterality: Left;  . THROMBECTOMY AND REVISION OF ARTERIOVENTOUS (AV) GORETEX  GRAFT Right 12/12/2017   Procedure: THROMBECTOMY ARTERIOVENTOUS (AV) GORETEX  GRAFT RIGHT UPPER ARM;  Surgeon: Conrad Dover, MD;  Location: [place];  Service: Vascular;  Laterality: Right;     A IV Location/Drains/Wounds Patient Lines/Drains/Airways Status   Active Line/Drains/Airways    Name:   Placement date:   Placement time:   Site:   Days:   CVC Triple Lumen 08/29/18 Right   08/29/18    1400     210   Fistula / Graft Right Upper arm Arteriovenous vein graft   10/31/17    -    Upper arm   512   Fistula / Graft Left Upper arm Arteriovenous vein graft   08/09/18    0856    Upper arm   230   Hemodialysis Catheter Left Internal jugular Double-lumen   05/24/18    1045    Internal jugular   307   Incision (Closed) 08/09/18 Arm Left   08/09/18    0831     230   Incision (Closed) 03/21/19 Toe (Comment  which one) Right   03/21/19    0753     6   Wound / Incision (Open or Dehisced) 03/15/19 Other (Comment) Toe (Comment  which one) Right   03/15/19    1752    Toe (Comment  which one)   12          Intake/Output Last 24 hours No intake or output data in the 24 hours ending 03/27/19 1700  Labs/Imaging Results for orders placed or performed during the hospital encounter of 03/27/19 (from the past 48 hour(s))  Comprehensive metabolic panel     Status: Abnormal   Collection Time: 03/27/19  1:51 PM  Result Value Ref Range   Sodium 135 135 - 145 mmol/L   Potassium 4.0 3.5 - 5.1 mmol/L   Chloride 95 (L) 98 - 111 mmol/L   CO2 23 22 - 32 mmol/L   Glucose, Bld 114 (H) 70 - 99 mg/dL   BUN 14 6 - 20 mg/dL   Creatinine, Ser 4.66 (H) 0.61 - 1.24 mg/dL   Calcium  9.8 8.9 - 10.3 mg/dL   Total Protein 8.3 (H) 6.5 - 8.1 g/dL   Albumin 3.1 (L) 3.5 - 5.0 g/dL   AST 28 15 - 41 U/L   ALT 9 0 - 44 U/L   Alkaline Phosphatase 105 38 - 126 U/L   Total Bilirubin 0.7 0.3 - 1.2 mg/dL  GFR calc non Af Amer 13 (L) >60 mL/min   GFR calc Af Amer 15 (L) >60 mL/min   Anion gap 17 (H) 5 - 15    Comment: Performed at [facility] [address], [place], [place] [address]  CBC with Differential     Status: Abnormal   Collection Time: 03/27/19  1:51 PM  Result Value Ref Range   WBC 17.1 (H) 4.0 - 10.5 K/uL   RBC 3.32 (L) 4.22 - 5.81 MIL/uL   Hemoglobin 9.9 (L) 13.0 - 17.0 g/dL   HCT 31.5 (L) 39.0 - 52.0 %   MCV 94.9 80.0 - 100.0 fL   MCH 29.8 26.0 - 34.0 pg   MCHC 31.4 30.0 - 36.0 g/dL   RDW 15.6 (H) 11.5 - 15.5 %   Platelets 322 150 - 400 K/uL   nRBC 0.2 0.0 - 0.2 %   Neutrophils Relative % 86 %   Neutro Abs 14.9 (H) 1.7 - 7.7 K/uL   Lymphocytes Relative 6 %   Lymphs Abs 1.0 0.7 - 4.0 K/uL   Monocytes Relative 5 %   Monocytes Absolute 0.9 0.1 - 1.0 K/uL   Eosinophils Relative 1 %   Eosinophils Absolute 0.1 0.0 - 0.5 K/uL   Basophils Relative 0 %   Basophils Absolute 0.0 0.0 - 0.1 K/uL   Immature Granulocytes 2 %   Abs Immature Granulocytes 0.26 (H) 0.00 - 0.07 K/uL    Comment: Performed at [facility] [address], [place], [place] [address]  Lactate dehydrogenase     Status: Abnormal   Collection Time: 03/27/19  1:51 PM  Result Value Ref Range   LDH 270 (H) 98 - 192 U/L    Comment: Performed at [facility], [place] [address] [place], [place] [address]  Protime-INR     Status: Abnormal   Collection Time: 03/27/19  1:51 PM  Result Value Ref Range   Prothrombin Time 36.9 (H) 11.4 - 15.2 seconds   INR 3.8 (H) 0.8 - 1.2    Comment: (NOTE) INR goal varies based on device and disease states. Performed at [facility], [place] [address] [place], [place] [address]    *Note: Due to a large number of results and/or encounters  for the requested time period, some results have not been displayed. A complete set of results can be found in Results Review.   No results found.  Pending Labs [employer] (From admission, onward)    Start     Ordered   03/27/19 1456  SARS Coronavirus 2 (CEPHEID- Performed in [place] hospital lab), Hosp Order  (Symptomatic Patients Labs with Precautions )  Once,   STAT    Question:  Patient immune status  Answer:  Normal   03/27/19 1455   03/27/19 1449  Culture, blood (routine x 2)  BLOOD CULTURE X 2,   R (with STAT occurrences)     03/27/19 1448   03/27/19 1351  Lactic acid, plasma  Now then every 2 hours,   STAT     03/27/19 1350   Signed and Held  Protime-INR  ONCE - STAT,   STAT    Comments: Blood test to be collected in minimum volume tubes.    Signed and Held   Signed and Held  Lactate dehydrogenase  Daily,   R     Signed and Held   Signed and Held  Protime-INR  Daily,   R  Signed and Held   Signed and Held  CBC  Daily,   R     Signed and Held   Signed and Held  Basic metabolic panel  Daily,   R     Signed and Held          Vitals/Pain Today's Vitals   03/27/19 1349 03/27/19 1400 03/27/19 1415 03/27/19 1620  BP: (!) 141/99 (!) 148/103 (!) 148/111   Pulse: 72     Resp: (!) 21 (!) 25 15   Temp: 98.4 F (36.9 C)     TempSrc: Oral     SpO2: 100%     PainSc:    0-No pain    Isolation Precautions Airborne and Contact precautions  Medications Medications  fentaNYL (SUBLIMAZE) injection 50 mcg (has no administration in time range)  oxyCODONE (Oxy IR/ROXICODONE) immediate release tablet 5 mg (has no administration in time range)  promethazine (PHENERGAN) injection 25 mg (25 mg Intramuscular Given 03/27/19 1404)  LORazepam (ATIVAN) injection 0.5 mg (0.5 mg Intravenous Given 03/27/19 1411)    Mobility walks Low fall risk   Focused Assessments Cardiac Assessment Handoff:    Lab Results  Component Value Date   CKTOTAL 309 06/15/2017   CKMB 2.7  11/18/2011   TROPONINI 0.07 (HH) 10/30/2017   Lab Results  Component Value Date   DDIMER 2.73 (H) 03/01/2018   Does the Patient currently have chest pain? No     R Recommendations: See Admitting Provider Note  Report given to:   Additional Notes: Central Line will be placed on the floor. Pt dialyzed today. Pt was below dry weight. EDP aware pt does not have IV access. Gastroparesis symptoms have improved.

## 2019-03-27 NOTE — Progress Notes (Signed)
LVAD Coordinator ED Encounter  LVAD is a HM III and was implanted on 03/01/18 by Dr. Prescott Gum.   Received page from patient this afternoon stating he has been nauseated and throwing up for 2 days and that his abdomen "is really hurting." States he has not had a bowel movement in 5 days, but did have a "very hard" bowel movement last night. He has not been taking a stool softener, but has been taking pain medication. He has been unable to take any of his medications since Monday. Per patient he attended dialysis this morning and was below his usual dry weight.   Right foot is very tender to touch, bruising around recent amputation noted. Dressing with dried blood in place over amputation site. Redness over top of foot; toes very dark. Pulse heard via doppler signal.    Vital signs: HR: 70 Doppler MAP: 122 Automated BP: 155/113 (126) O2 Sat:   LVAD interrogation reveals:  Speed: 5500 Flow: 3.6 Power: 4.6w  PI: 7.3  Alarms: none Events: 100+ PI events today  Drive Line: Weekly dressing in place. [place] clean, dry, intact.    Updated Darrick Grinder NP about the above. No LVAD issues and pump is functioning as expected. Able to independently manage LVAD equipment. No LVAD needs at this time. Plan for admission to [facility]. IV team at bedside currently attempting IV access with ultrasound. Labs pending.    [name] RN [place] Coordinator  Office: [phone]  24/7 Pager: [phone]

## 2019-03-27 NOTE — ED Notes (Addendum)
EDP aware that patient does not have IV access. Pt did go to dialysis today and got it but was below dry weight

## 2019-03-27 NOTE — ED Notes (Signed)
Gastroparesis symptoms improved.

## 2019-03-27 NOTE — ED Provider Notes (Signed)
[name]   CSN: [id number] Arrival date & time: 03/27/19  1315     History   Chief Complaint Chief Complaint  Patient presents with  . Abdominal Pain  . Nausea    HPI [name] is a 53 y.o. male.     The history is provided by the patient and medical records. No language interpreter was used.  Abdominal Pain  [name] is a 53 y.o. male who presents to the Emergency Department complaining of abdominal pain. He presents to the emergency department complaining of abrupt onset severe waxing and waning right sided abdominal pain that began earlier today. He has occasional associated nausea and vomiting. He has experienced similar episodes in the past with no clear cause. He does have a history of ESR D and is on hemodialysis, received a dialysis session today and he is below his dry weight. He also has a history of heart failure and has an LVAD in place. He denies any fevers, diarrhea. Symptoms are severe, worsening. Past Medical History:  Diagnosis Date  . Angina   . ASCVD (arteriosclerotic cardiovascular disease)    , Anterior infarction 2005, LAD diagonal bifurcation intervention 03/2004  . Automatic implantable cardiac defibrillator -[employer]. Jude's       . Benign neoplasm of colon   . CHF (congestive heart failure) ([place])   . Chronic systolic heart failure ([place])   . Coronary artery disease     Widely patent previously placed stents in the left anterior   . Crohn's disease ([place])   . Deep venous thrombosis (HCC)    Recurrent-on Coumadin  . Dialysis patient ([place])   . Dyspnea   . Gastroparesis   . GERD (gastroesophageal reflux disease)   . High cholesterol   . Hyperlipidemia   . Hypersomnolent    Previous diagnosis of narcolepsy  . Hypertension, essential   . Ischemic cardiomyopathy    Ejection fraction 15-20% catheterization 2010  . Type II or unspecified type diabetes mellitus without mention of  complication, not stated as uncontrolled   . Unspecified gastritis and gastroduodenitis without mention of hemorrhage     Patient Active Problem List   Diagnosis Date Noted  . Ischemic pain of right foot 03/15/2019  . Ischemic foot 03/15/2019  . Subtherapeutic international normalized ratio (INR) 08/28/2018  . Elevated LDH 08/22/2018  . Acute vomiting   . Nausea & vomiting 06/11/2018  . Heart failure ([place]) 05/23/2018  . Hypertensive urgency 03/25/2018  . Benign essential HTN   . Crohn's disease with complication ([place])   . Diabetes mellitus type 2 in nonobese (HCC)   . Anemia of chronic disease   . Sinus tachycardia   . Leukocytosis   . Left ventricular assist device (LVAD) complication 73/41/9379  . Left ventricular assist device (LVAD) complication, initial encounter 02/28/2018  . Factor V Leiden carrier ([place]) 01/10/2018  . History of creation of ostomy ([place]) 01/10/2018  . Hypercoagulable state ([place]) 01/10/2018  . ESRD (end stage renal disease) ([place]) 12/11/2017  . Left ventricular assist device present ([place]) 12/05/2017  . ESRD (end stage renal disease) on dialysis ([place]) 10/29/2017  . Nausea with vomiting 09/12/2017  . Intractable nausea and vomiting 07/31/2017  . 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) ([place]) 07/10/2017  . LVAD (left ventricular assist device) present ([place])   . Palliative care by specialist   . Chronic systolic CHF (congestive heart failure) ([place]) 06/07/2017  . ACP (advance care planning)   .  Encounter for central line placement   . Diarrhea   . Palliative care encounter   . Nausea   . CHF exacerbation ([place]) 04/05/2017  . Diabetic keto-acidosis ([place]) 04/05/2017  . Type 2 diabetes mellitus with complication, with long-term current use of insulin ([place]) 03/08/2017  . ESRD on dialysis ([place]) 01/17/2017  . AKI (acute kidney injury) ([place])   . Non-ST elevation (NSTEMI) myocardial infarction ([place])   . CHF (congestive heart failure) ([place]) 09/02/2016  .  Elevated serum creatinine   . Encounter for therapeutic drug monitoring 08/03/2016  . Chest pain 04/26/2012  . SVT (supraventricular tachycardia) ([place]) 04/26/2012  . Gastroparesis 11/18/2011  . Nausea and vomiting in adult 11/17/2011  . Coronary artery disease   . Ischemic cardiomyopathy   . Essential hypertension   . Automatic implantable cardioverter-defibrillator in situ   . Deep venous thrombosis ([place])   . Hypersomnolent   . POLYP, COLON 09/25/2008  . Type 2 diabetes mellitus ([place]) 09/25/2008  . Dyslipidemia, goal LDL below 70 09/25/2008  . GASTRITIS 09/25/2008  . DIVERTICULOSIS, COLON 09/25/2008  . RECTAL MASS 09/25/2008  . History of Crohn's disease 09/05/2006    Past Surgical History:  Procedure Laterality Date  . A/V SHUNTOGRAM N/A 05/24/2018   Procedure: A/V SHUNTOGRAM;  Surgeon: Marty Heck, MD;  Location: [facility];  Service: Cardiovascular;  Laterality: N/A;  . ABDOMINAL AORTOGRAM W/LOWER EXTREMITY N/A 03/18/2019   Procedure: ABDOMINAL AORTOGRAM W/LOWER EXTREMITY;  Surgeon: Waynetta Sandy, MD;  Location: [facility];  Service: Cardiovascular;  Laterality: N/A;  . AMPUTATION Right 03/21/2019   Procedure: AMPUTATION DIGIT RIGHT FOURTH TOE;  Surgeon: Waynetta Sandy, MD;  Location: [facility];  Service: Vascular;  Laterality: Right;  . APPENDECTOMY    . AV FISTULA PLACEMENT Right 06/26/2017   Procedure: ARTERIOVENOUS (AV) FISTULA CREATION VERSUS GRAFT RIGHT  ARM;  Surgeon: Conrad Ruthville, MD;  Location: [facility];  Service: Vascular;  Laterality: Right;  . AV FISTULA PLACEMENT Right 10/31/2017   Procedure: INSERTION OF ARTERIOVENOUS (AV) ARTEGRAFT,  RIGHT UPPER ARM;  Surgeon: Conrad Driscoll, MD;  Location: [facility];  Service: Vascular;  Laterality: Right;  . AV FISTULA PLACEMENT Left 05/29/2018   Procedure: ARTERIOVENOUS (AV) FISTULA CREATION  LEFT UPPER EXTREMITY;  Surgeon: Waynetta Sandy, MD;  Location: [facility];  Service: Vascular;   Laterality: Left;  . AV FISTULA PLACEMENT Left 08/09/2018   Procedure: INSERTION OF ARTERIOVENOUS (AV) GORE-TEX GRAFT LEFT UPPER ARM;  Surgeon: Waynetta Sandy, MD;  Location: [facility];  Service: Vascular;  Laterality: Left;  . CARDIAC DEFIBRILLATOR PLACEMENT  2010   St. Jude ICD  . CENTRAL LINE INSERTION Right 05/24/2018   Procedure: CENTRAL LINE INSERTION;  Surgeon: Marty Heck, MD;  Location: [facility];  Service: Cardiovascular;  Laterality: Right;  . COLECTOMY  ~ 2003   "for Crohn's" ascending colon.    . CORONARY STENT INTERVENTION N/A 11/25/2016   Procedure: Coronary Stent Intervention;  Surgeon: Leonie Man, MD;  Location: [facility];  Service: Cardiovascular;  Laterality: N/A;  . EP IMPLANTABLE DEVICE N/A 09/14/2016   Procedure: ICD Generator Changeout;  Surgeon: Deboraha Sprang, MD;  Location: [facility];  Service: Cardiovascular;  Laterality: N/A;  . ESOPHAGOGASTRODUODENOSCOPY N/A 07/12/2017   Procedure: ESOPHAGOGASTRODUODENOSCOPY (EGD);  Surgeon: Jerene Bears, MD;  Location: [facility];  Service: Gastroenterology;  Laterality: N/A;  VAD pt so VAD team will need to accompany pt.   [name]  FETAL SURGERY FOR CONGENITAL HERNIA     ????? pt has no knowledge of this..    . FISTULOGRAM Left 08/09/2018   Procedure: FISTULOGRAM LEFT ARM;  Surgeon: Waynetta Sandy, MD;  Location: [facility];  Service: Vascular;  Laterality: Left;  . INGUINAL HERNIA REPAIR    . INSERTION OF DIALYSIS CATHETER Right 06/26/2017   Procedure: INSERTION OF TUNNELED  DIALYSIS CATHETER RIGHT INTERNAL JUGULAR;  Surgeon: Conrad Dellwood, MD;  Location: [place];  Service: Vascular;  Laterality: Right;  . INSERTION OF IMPLANTABLE LEFT VENTRICULAR ASSIST DEVICE N/A 06/12/2017   Procedure: INSERTION OF IMPLANTABLE LEFT VENTRICULAR ASSIST [place] 3;  Surgeon: Ivin Poot, MD;  Location: [facility];  Service: Open Heart Surgery;  Laterality: N/A;  HM3 LVAD  CIRC ARREST  NITRIC  OXIDE  . INSERTION OF IMPLANTABLE LEFT VENTRICULAR ASSIST DEVICE N/A 02/28/2018   Procedure: REDO INSERTION OF IMPLANTABLE LEFT VENTRICULAR ASSIST DEVICE - HEARTMATE 3;  Surgeon: Ivin Poot, MD;  Location: [facility];  Service: Open Heart Surgery;  Laterality: N/A;  . IR REMOVAL TUN CV CATH W/O FL  02/26/2018  . IR THORACENTESIS ASP PLEURAL SPACE W/IMG GUIDE  06/28/2017  . LIGATION OF ARTERIOVENOUS  FISTULA Right 10/31/2017   Procedure: LIGATION OF BRACHIO-BASILIC VEIN TRANSPOSITION RIGHT ARM;  Surgeon: Conrad [place], MD;  Location: [facility];  Service: Vascular;  Laterality: Right;  . MULTIPLE EXTRACTIONS WITH ALVEOLOPLASTY N/A 06/08/2017   Procedure: MULTIPLE EXTRACTION WITH ALVEOLOPLASTY AND PRE PROSTHETIC SURGERY AS NEEDED;  Surgeon: Lenn Cal, DDS;  Location: [facility];  Service: Oral Surgery;  Laterality: N/A;  . PERIPHERAL VASCULAR BALLOON ANGIOPLASTY  03/18/2019   Procedure: PERIPHERAL VASCULAR BALLOON ANGIOPLASTY;  Surgeon: Waynetta Sandy, MD;  Location: [place] [facility];  Service: Cardiovascular;;  RT PT  . PERIPHERAL VASCULAR INTERVENTION  03/18/2019   Procedure: PERIPHERAL VASCULAR INTERVENTION;  Surgeon: Waynetta Sandy, MD;  Location: [place] [facility];  Service: Cardiovascular;;  RT SFA  . RIGHT HEART CATH N/A 06/07/2017   Procedure: RIGHT HEART CATH;  Surgeon: Larey Dresser, MD;  Location: [place] [facility];  Service: Cardiovascular;  Laterality: N/A;  . RIGHT HEART CATH N/A 02/08/2018   Procedure: RIGHT HEART CATH;  Surgeon: Larey Dresser, MD;  Location: [place] [facility];  Service: Cardiovascular;  Laterality: N/A;  . RIGHT HEART CATH N/A 08/09/2018   Procedure: RIGHT HEART CATH;  Surgeon: Larey Dresser, MD;  Location: [place] [facility];  Service: Cardiovascular;  Laterality: N/A;  . RIGHT/LEFT HEART CATH AND CORONARY ANGIOGRAPHY N/A 11/23/2016   Procedure: Right/Left Heart Cath and Coronary Angiography;  Surgeon: Larey Dresser, MD;  Location: [place] [place] [facility];  Service: Cardiovascular;  Laterality: N/A;  . TEE WITHOUT CARDIOVERSION N/A 06/12/2017   Procedure: TRANSESOPHAGEAL ECHOCARDIOGRAM (TEE);  Surgeon: Prescott Gum, Collier Salina, MD;  Location: [place];  Service: Open Heart Surgery;  Laterality: N/A;  . TEE WITHOUT CARDIOVERSION N/A 02/28/2018   Procedure: TRANSESOPHAGEAL ECHOCARDIOGRAM (TEE);  Surgeon: Prescott Gum, Collier Salina, MD;  Location: [place];  Service: Open Heart Surgery;  Laterality: N/A;  . TEMPORARY DIALYSIS CATHETER Left 05/24/2018   Procedure: TEMPORARY DIALYSIS CATHETER;  Surgeon: Marty Heck, MD;  Location: [place] [facility];  Service: Cardiovascular;  Laterality: Left;  . THROMBECTOMY AND REVISION OF ARTERIOVENTOUS (AV) GORETEX  GRAFT Right 12/12/2017   Procedure: THROMBECTOMY ARTERIOVENTOUS (AV) GORETEX  GRAFT RIGHT UPPER ARM;  Surgeon: Conrad [place], MD;  Location: [place];  Service: Vascular;  Laterality: Right;        Home Medications    Prior to Admission medications   Medication Sig Start Date End Date Taking? Authorizing Provider  acetaminophen (TYLENOL) 325 MG tablet Take 2 tablets (650 mg total) by mouth every 4 (four) hours as needed for headache or mild pain. 03/22/19  Yes Kroeger, Lorelee Cover., PA-C  aspirin EC 81 MG tablet Take 1 tablet (81 mg total) by mouth daily. 08/20/18  Yes Eileen Stanford, PA-C  docusate sodium (COLACE) 100 MG capsule Take 1 capsule (100 mg total) by mouth daily as needed for mild constipation. 03/22/19  Yes Kroeger, Lorelee Cover., PA-C  gabapentin (NEURONTIN) 100 MG capsule Take 1 capsule (100 mg total) by mouth 2 (two) times daily. Patient taking differently: Take 100 mg by mouth 3 (three) times daily.  03/12/19  Yes Larey Dresser, MD  insulin glargine (LANTUS) 100 UNIT/ML injection Inject 0.1 mLs (10 Units total) into the skin daily. Patient taking differently: Inject 5 Units into the skin daily.  05/09/18  Yes Lovenia Kim, MD  midodrine (PROAMATINE) 5 MG tablet Take 1 tablet (5 mg total) by  mouth daily. Take 1 tablet before dialysis treatment 02/07/19  Yes Larey Dresser, MD  multivitamin (RENA-VIT) TABS tablet Take 1 tablet by mouth daily.   Yes [provider]  NOVOLOG 100 UNIT/ML injection INJECT 7 UNITS INTO THE SKIN DAILY WITH SUPPER. Patient taking differently: Inject 7 Units into the skin daily with supper.  03/15/19  Yes Anderson, Chelsey L, DO  oxyCODONE (ROXICODONE) 5 MG immediate release tablet Take 1-2 tablets every 4-6 hours for severe pain. 03/22/19  Yes Kroeger, Daleen Snook M., PA-C  promethazine (PHENERGAN) 12.5 MG tablet Take 1 tablet (12.5 mg total) by mouth every 6 (six) hours as needed for nausea or vomiting. 10/23/18  Yes Larey Dresser, MD  warfarin (COUMADIN) 5 MG tablet TAKE AS FOLLOWS: MON-WED-FRI 1&1/2 TABLETS DAILY. ON ALL OTHER DAYS 1 TABLET Patient taking differently: Take 5-705 mg by mouth one time only at 6 PM. 7.5 mg  Take Tuesday and Thursday 5 mg all the other days 09/12/18  Yes Larey Dresser, MD  atorvastatin (LIPITOR) 40 MG tablet Take 1 tablet (40 mg total) by mouth daily at 6 PM. Patient not taking: Reported on 03/27/2019 01/25/18   [name], MD  calcitRIOL (ROCALTROL) 0.5 MCG capsule Take 1 capsule (0.5 mcg total) by mouth every Monday, Wednesday, and Friday. Patient not taking: Reported on 03/27/2019 06/06/18   Georgiana Shore, NP  citalopram (CELEXA) 20 MG tablet Take 1 tablet (20 mg total) by mouth daily. Patient not taking: Reported on 03/27/2019 03/19/18   Georgiana Shore, NP  dronabinol (MARINOL) 2.5 MG capsule Take 1 capsule (2.5 mg total) by mouth 2 (two) times daily before lunch and supper. Patient not taking: Reported on 03/27/2019 03/27/18   Larey Dresser, MD  erythromycin (E-MYCIN) 250 MG tablet Take 1 tablet (250 mg total) by mouth 3 (three) times daily. Patient not taking: Reported on 03/27/2019 03/25/18   Isaiah Serge, NP  LORazepam (ATIVAN) 0.5 MG tablet Take 1 tablet (0.5 mg total) by mouth every 12 (twelve)  hours as needed (nausea). Patient not taking: Reported on 03/27/2019 03/27/18   Larey Dresser, MD  metoCLOPramide (REGLAN) 10 MG tablet Take 1 tablet (10 mg total) by mouth 3 (three) times daily. Patient not taking: Reported on 03/27/2019 01/22/18   Georgiana Shore, NP  traMADol Veatrice Bourbon)  50 MG tablet Take 1 tablet (50 mg total) by mouth every 6 (six) hours as needed for moderate pain. Patient not taking: Reported on 03/27/2019 08/18/18   Eileen Stanford, PA-C    Family History Family History  Problem Relation Age of Onset  . Colon polyps Mother   . Diabetes Mother   . Heart attack Father   . Diabetes Father   . Stroke Paternal Grandfather   . Colitis Sister   . Heart disease Brother   . Colitis Sister   . Colon cancer Neg Hx     Social History Social History   Tobacco Use  . Smoking status: Never Smoker  . Smokeless tobacco: Never Used  Substance Use Topics  . Alcohol use: No  . Drug use: No     Allergies   Metformin and related   Review of Systems Review of Systems  Gastrointestinal: Positive for abdominal pain.  All other systems reviewed and are negative.    Physical Exam Updated Vital Signs BP (!) 148/111   Pulse 72   Temp 98.4 F (36.9 C) (Oral)   Resp 15   SpO2 100%   Physical Exam Vitals signs and nursing [name] reviewed.  Constitutional:      Appearance: He is well-developed.     Comments: Uncomfortable appearing  HENT:     Head: Normocephalic and atraumatic.  Cardiovascular:     Comments: Mechanical hum Pulmonary:     Effort: Pulmonary effort is normal. No respiratory distress.     Breath sounds: Normal breath sounds.     Comments: Vast cath in the left upper chest wall with dressing in place, clean, dry, intact. Abdominal:     Palpations: Abdomen is soft.     Tenderness: There is no guarding or rebound.     Comments: Mild left-sided abdominal tenderness without guarding or rebound. There is a catheter in the right side of the abdomen  without surrounding erythema or drainage.  Musculoskeletal:        General: No swelling or tenderness.     Comments: Ecchymosis to the right dorsal foot.  Skin:    General: Skin is warm and dry.  Neurological:     Mental Status: He is alert and oriented to person, place, and time.  Psychiatric:        Behavior: Behavior normal.      ED Treatments / Results  Labs (all labs ordered are listed, but only abnormal results are displayed) Labs Reviewed  COMPREHENSIVE METABOLIC PANEL - Abnormal; Notable for the following components:      Result Value   Chloride 95 (*)    Glucose, Bld 114 (*)    Creatinine, Ser 4.66 (*)    Total Protein 8.3 (*)    Albumin 3.1 (*)    GFR calc non Af Amer 13 (*)    GFR calc Af Amer 15 (*)    Anion gap 17 (*)    All other components within normal limits  CBC WITH DIFFERENTIAL/PLATELET - Abnormal; Notable for the following components:   WBC 17.1 (*)    RBC 3.32 (*)    Hemoglobin 9.9 (*)    HCT 31.5 (*)    RDW 15.6 (*)    Neutro Abs 14.9 (*)    Abs Immature Granulocytes 0.26 (*)    All other components within normal limits  LACTATE DEHYDROGENASE - Abnormal; Notable for the following components:   LDH 270 (*)    All other components within normal limits  PROTIME-INR - Abnormal; Notable for the following components:   Prothrombin Time 36.9 (*)    INR 3.8 (*)    All other components within normal limits  CULTURE, BLOOD (ROUTINE X 2)  CULTURE, BLOOD (ROUTINE X 2)  SARS CORONAVIRUS 2 (HOSPITAL ORDER, [facility])  LACTIC ACID, PLASMA  LACTIC ACID, PLASMA    EKG EKG Interpretation  Date/Time:  Wednesday March 27 2019 13:40:47 EDT Ventricular Rate:  77 PR Interval:    QRS Duration: 86 QT Interval:  345 QTC Calculation: 393 R Axis:   136 Text Interpretation:  Sinus rhythm Prolonged PR interval Probable left atrial enlargement Probable anterolateral infarct, age indeterm Artifact in lead(s) I II aVR aVL aVF V1 V2 V3 V4  V5 V6 Confirmed by Quintella Reichert [phone]) on 03/27/2019 1:47:50 PM   Radiology No results found.  Procedures Procedures (including critical care time)  Medications Ordered in ED Medications  fentaNYL (SUBLIMAZE) injection 50 mcg (has no administration in time range)  promethazine (PHENERGAN) injection 25 mg (25 mg Intramuscular Given 03/27/19 1404)  LORazepam (ATIVAN) injection 0.5 mg (0.5 mg Intravenous Given 03/27/19 1411)     Initial Impression / Assessment and Plan / ED Course  I have reviewed the triage vital signs and the nursing notes.  Pertinent labs & imaging results that were available during my care of the patient were reviewed by me and considered in my medical decision making (see chart for details).        Patient with history of ESR D, heart failure with an LVAD in place here for evaluation of abdominal pain. INR is therapeutic.  Plan to admit to heart failure service for further work up and management.    Final Clinical Impressions(s) / ED Diagnoses   Final diagnoses:  None    ED Discharge Orders    None       Quintella Reichert, MD 03/27/19 1624

## 2019-03-27 NOTE — H&P (Signed)
Advanced Heart Failure VAD History and Physical Note   PCP-Cardiologist: No primary care provider on file.   Reason for Admission: Nausea/Vomiting  HPI:    [name] is a 53 year old with CAD s/p anterior MI in 2010 and NSTEMI in 3/18 with DES to pRCA and mLAD, gastroparesis,  ischemic cardiomyopathy now s/p Heartmate 3 LVAD and ESRD, factor V leiden, PAF, and ischemic foot. He has had multiple admits for N/V.   Admitted 7/10 with ischemic right foot. VVS consulted and on 7/13 he underwent peripheral angiogram that showed 80% stenosis r SFV and occluded right PT and had stent to right SFA and angioplasty to PT. He then had 4th toe amputation. He was discharged to home on 03/22/19.   Over the last 48 hours he developed N/V. He has not been able to take his medications or eat. Says right foot pain is worse.Unable to bear weight on right foot. No temp or sore throat.  Denies fever or chills. Denies SOB.  Able to go to dialysis today and was under his dry weight.   LVAD INTERROGATION:  HeartMate II LVAD:  Flow 3.6  liters/min, speed 5500, power 4.6 , PI 7.3 . Multiple PI events.    Review of Systems: [y] = yes, [ ]  = no   General: Weight gain [ ] ; Weight loss [ ] ; Anorexia [ ] ; Fatigue [ Y]; Fever [ ] ; Chills [ ] ; Weakness [ Y]  Cardiac: Chest pain/pressure [ ] ; Resting SOB [ ] ; Exertional SOB [ ] ; Orthopnea [ ] ; Pedal Edema [ ] ; Palpitations [ ] ; Syncope [ ] ; Presyncope [ ] ; Paroxysmal nocturnal dyspnea[ ]   Pulmonary: Cough [ ] ; Wheezing[ ] ; Hemoptysis[ ] ; Sputum [ ] ; Snoring [ ]   GI: Vomiting[Y ]; Dysphagia[ ] ; Melena[ ] ; Hematochezia [ ] ; Heartburn[ ] ; Abdominal pain [ ] ; Constipation [ ] ; Diarrhea [ ] ; BRBPR [ ]   GU: Hematuria[ ] ; Dysuria [ ] ; Nocturia[ ]   Vascular: Pain in legs with walking [ ] ; Pain in feet with lying flat [ ] ; Non-healing sores [ ] ; Stroke [ ] ; TIA [ ] ; Slurred speech [ ] ;  Neuro: Headaches[ ] ; Vertigo[ ] ; Seizures[ ] ; Paresthesias[ ] ;Blurred vision [ ] ; Diplopia [  ]; Vision changes [ ]   Ortho/Skin: Arthritis [ ] ; Joint pain [Y ]; Muscle pain [ ] ; Joint swelling [ ] ; Back Pain [ ] ; Rash [ ]   Psych: Depression[ ] ; Anxiety[Y ]  Heme: Bleeding problems [ ] ; Clotting disorders [ ] ; Anemia [ ]   Endocrine: Diabetes [Y ]; Thyroid dysfunction[ ]     Home Medications Prior to Admission medications   Medication Sig Start Date End Date Taking? Authorizing Provider  acetaminophen (TYLENOL) 325 MG tablet Take 2 tablets (650 mg total) by mouth every 4 (four) hours as needed for headache or mild pain. 03/22/19  Yes Kroeger, Daleen Snook M., PA-C  amiodarone (PACERONE) 200 MG tablet Take 200 mg by mouth daily.   Yes [provider]  aspirin EC 81 MG tablet Take 1 tablet (81 mg total) by mouth daily. 08/20/18  Yes Eileen Stanford, PA-C  atorvastatin (LIPITOR) 40 MG tablet Take 1 tablet (40 mg total) by mouth daily at 6 PM. 01/25/18  Yes [name], MD  citalopram (CELEXA) 20 MG tablet Take 1 tablet (20 mg total) by mouth daily. 03/19/18  Yes Georgiana Shore, NP  docusate sodium (COLACE) 100 MG capsule Take 1 capsule (100 mg total) by mouth daily as needed for mild constipation. 03/22/19  Yes Kroeger, Daleen Snook  M., PA-C  dronabinol (MARINOL) 2.5 MG capsule Take 1 capsule (2.5 mg total) by mouth 2 (two) times daily before lunch and supper. 03/27/18  Yes Larey Dresser, MD  gabapentin (NEURONTIN) 100 MG capsule Take 1 capsule (100 mg total) by mouth 2 (two) times daily. 03/12/19  Yes Larey Dresser, MD  insulin glargine (LANTUS) 100 UNIT/ML injection Inject 0.1 mLs (10 Units total) into the skin daily. Patient taking differently: Inject 5 Units into the skin daily.  05/09/18  Yes Lovenia Kim, MD  LORazepam (ATIVAN) 0.5 MG tablet Take 1 tablet (0.5 mg total) by mouth every 12 (twelve) hours as needed (nausea). 03/27/18  Yes Larey Dresser, MD  metoCLOPramide (REGLAN) 10 MG tablet Take 1 tablet (10 mg total) by mouth 3 (three) times daily. 01/22/18  Yes Georgiana Shore, NP  midodrine (PROAMATINE) 5 MG tablet Take 1 tablet (5 mg total) by mouth daily. Take 1 tablet before dialysis treatment 02/07/19  Yes Larey Dresser, MD  multivitamin (RENA-VIT) TABS tablet Take 1 tablet by mouth daily.   Yes [provider]  NOVOLOG 100 UNIT/ML injection INJECT 7 UNITS INTO THE SKIN DAILY WITH SUPPER. Patient taking differently: Inject 7 Units into the skin daily with supper.  03/15/19  Yes Anderson, Chelsey L, DO  promethazine (PHENERGAN) 12.5 MG tablet Take 1 tablet (12.5 mg total) by mouth every 6 (six) hours as needed for nausea or vomiting. 10/23/18  Yes Larey Dresser, MD  sevelamer carbonate (RENVELA) 800 MG tablet Take 1,600-4,000 mg by mouth See admin instructions. 4065m three times daily with meals and 16033mtwice daily with snacks   Yes [provider]  traMADol (ULTRAM) 50 MG tablet Take 1 tablet (50 mg total) by mouth every 6 (six) hours as needed for moderate pain. 08/18/18  Yes ThEileen StanfordPA-C  calcitRIOL (ROCALTROL) 0.5 MCG capsule Take 1 capsule (0.5 mcg total) by mouth every Monday, Wednesday, and Friday. 06/06/18   SmGeorgiana ShoreNP  erythromycin (E-MYCIN) 250 MG tablet Take 1 tablet (250 mg total) by mouth 3 (three) times daily. Patient not taking: Reported on 03/27/2019 03/25/18   InIsaiah SergeNP  oxyCODONE (ROXICODONE) 5 MG immediate release tablet Take 1-2 tablets every 4-6 hours for severe pain. 03/22/19   Kroeger, KrLorelee Cover PA-C  pantoprazole (PROTONIX) 40 MG tablet Take 40 mg by mouth 2 (two) times daily.     [provider]  warfarin (COUMADIN) 5 MG tablet TAKE AS FOLLOWS: MON-WED-FRI 1&1/2 TABLETS DAILY. ON ALL OTHER DAYS 1 TABLET Patient taking differently: Take 5-705 mg by mouth one time only at 6 PM. 7.5 mg  Take Tuesday and Thursday 5 mg all the other days 09/12/18   McLarey DresserMD    Past Medical History: Past Medical History:  Diagnosis Date   Angina    ASCVD (arteriosclerotic  cardiovascular disease)    , Anterior infarction 2005, LAD diagonal bifurcation intervention 03/2004   Automatic implantable cardiac defibrillator -St. Jude's        Benign neoplasm of colon    CHF (congestive heart failure) (HCC)    Chronic systolic heart failure (HCC)    Coronary artery disease     Widely patent previously placed stents in the left anterior    Crohn's disease ([facility]   Deep venous thrombosis ([facility]   Recurrent-on Coumadin   Dialysis patient ([place]   Dyspnea    Gastroparesis    GERD (gastroesophageal reflux disease)  High cholesterol    Hyperlipidemia    Hypersomnolent    Previous diagnosis of narcolepsy   Hypertension, essential    Ischemic cardiomyopathy    Ejection fraction 15-20% catheterization 2010   Type II or unspecified type diabetes mellitus without mention of complication, not stated as uncontrolled    Unspecified gastritis and gastroduodenitis without mention of hemorrhage     Past Surgical History: Past Surgical History:  Procedure Laterality Date   A/V SHUNTOGRAM N/A 05/24/2018   Procedure: A/V SHUNTOGRAM;  Surgeon: Marty Heck, MD;  Location: [place] [facility];  Service: Cardiovascular;  Laterality: N/A;   ABDOMINAL AORTOGRAM W/LOWER EXTREMITY N/A 03/18/2019   Procedure: ABDOMINAL AORTOGRAM W/LOWER EXTREMITY;  Surgeon: Waynetta Sandy, MD;  Location: [place] [facility];  Service: Cardiovascular;  Laterality: N/A;   AMPUTATION Right 03/21/2019   Procedure: AMPUTATION DIGIT RIGHT FOURTH TOE;  Surgeon: Waynetta Sandy, MD;  Location: [place];  Service: Vascular;  Laterality: Right;   APPENDECTOMY     AV FISTULA PLACEMENT Right 06/26/2017   Procedure: ARTERIOVENOUS (AV) FISTULA CREATION VERSUS GRAFT RIGHT  ARM;  Surgeon: Conrad Cedarhurst, MD;  Location: [place];  Service: Vascular;  Laterality: Right;   AV FISTULA PLACEMENT Right 10/31/2017   Procedure: INSERTION OF ARTERIOVENOUS (AV) ARTEGRAFT,  RIGHT  UPPER ARM;  Surgeon: Conrad Pella, MD;  Location: [place];  Service: Vascular;  Laterality: Right;   AV FISTULA PLACEMENT Left 05/29/2018   Procedure: ARTERIOVENOUS (AV) FISTULA CREATION  LEFT UPPER EXTREMITY;  Surgeon: Waynetta Sandy, MD;  Location: [place];  Service: Vascular;  Laterality: Left;   AV FISTULA PLACEMENT Left 08/09/2018   Procedure: INSERTION OF ARTERIOVENOUS (AV) GORE-TEX GRAFT LEFT UPPER ARM;  Surgeon: Waynetta Sandy, MD;  Location: [place];  Service: Vascular;  Laterality: Left;   CARDIAC DEFIBRILLATOR PLACEMENT  2010   St. Jude ICD   CENTRAL LINE INSERTION Right 05/24/2018   Procedure: CENTRAL LINE INSERTION;  Surgeon: Marty Heck, MD;  Location: [place] [facility];  Service: Cardiovascular;  Laterality: Right;   COLECTOMY  ~ 2003   "for Crohn's" ascending colon.     CORONARY STENT INTERVENTION N/A 11/25/2016   Procedure: Coronary Stent Intervention;  Surgeon: Leonie Man, MD;  Location: [place] [facility];  Service: Cardiovascular;  Laterality: N/A;   EP IMPLANTABLE DEVICE N/A 09/14/2016   Procedure: ICD Generator Changeout;  Surgeon: Deboraha Sprang, MD;  Location: [place] [facility];  Service: Cardiovascular;  Laterality: N/A;   ESOPHAGOGASTRODUODENOSCOPY N/A 07/12/2017   Procedure: ESOPHAGOGASTRODUODENOSCOPY (EGD);  Surgeon: Jerene Bears, MD;  Location: [facility];  Service: Gastroenterology;  Laterality: N/A;  VAD pt so VAD team will need to accompany pt.    FETAL SURGERY FOR CONGENITAL HERNIA     ????? pt has no knowledge of this.[name]     FISTULOGRAM Left 08/09/2018   Procedure: FISTULOGRAM LEFT ARM;  Surgeon: Waynetta Sandy, MD;  Location: [place];  Service: Vascular;  Laterality: Left;   INGUINAL HERNIA REPAIR     INSERTION OF DIALYSIS CATHETER Right 06/26/2017   Procedure: INSERTION OF TUNNELED  DIALYSIS CATHETER RIGHT INTERNAL JUGULAR;  Surgeon: Conrad [place], MD;  Location: [place];  Service: Vascular;  Laterality: Right;     INSERTION OF IMPLANTABLE LEFT VENTRICULAR ASSIST DEVICE N/A 06/12/2017   Procedure: INSERTION OF IMPLANTABLE LEFT VENTRICULAR ASSIST [place] 3;  Surgeon: Ivin Poot, MD;  Location: [place];  Service: Open Heart Surgery;  Laterality:  N/A;  HM3 LVAD  CIRC ARREST  NITRIC OXIDE   INSERTION OF IMPLANTABLE LEFT VENTRICULAR ASSIST DEVICE N/A 02/28/2018   Procedure: REDO INSERTION OF IMPLANTABLE LEFT VENTRICULAR ASSIST DEVICE - HEARTMATE 3;  Surgeon: Ivin Poot, MD;  Location: [place];  Service: Open Heart Surgery;  Laterality: N/A;   IR REMOVAL TUN CV CATH W/O FL  02/26/2018   IR THORACENTESIS ASP PLEURAL SPACE W/IMG GUIDE  06/28/2017   LIGATION OF ARTERIOVENOUS  FISTULA Right 10/31/2017   Procedure: LIGATION OF BRACHIO-BASILIC VEIN TRANSPOSITION RIGHT ARM;  Surgeon: Conrad B[place]erton, MD;  Location: [place];  Service: Vascular;  Laterality: Right;   MULTIPLE EXTRACTIONS WITH ALVEOLOPLASTY N/A 06/08/2017   Procedure: MULTIPLE EXTRACTION WITH ALVEOLOPLASTY AND PRE PROSTHETIC SURGERY AS NEEDED;  Surgeon: Lenn Cal, DDS;  Location: [place];  Service: Oral Surgery;  Laterality: N/A;   PERIPHERAL VASCULAR BALLOON ANGIOPLASTY  03/18/2019   Procedure: PERIPHERAL VASCULAR BALLOON ANGIOPLASTY;  Surgeon: Waynetta Sandy, MD;  Location: [place] [facility];  Service: Cardiovascular;;  RT PT   PERIPHERAL VASCULAR INTERVENTION  03/18/2019   Procedure: PERIPHERAL VASCULAR INTERVENTION;  Surgeon: Waynetta Sandy, MD;  Location: [place] [facility];  Service: Cardiovascular;;  RT SFA   RIGHT HEART CATH N/A 06/07/2017   Procedure: RIGHT HEART CATH;  Surgeon: Larey Dresser, MD;  Location: [place] [facility];  Service: Cardiovascular;  Laterality: N/A;   RIGHT HEART CATH N/A 02/08/2018   Procedure: RIGHT HEART CATH;  Surgeon: Larey Dresser, MD;  Location: [place] [facility];  Service: Cardiovascular;  Laterality: N/A;   RIGHT HEART CATH N/A 08/09/2018   Procedure: RIGHT  HEART CATH;  Surgeon: Larey Dresser, MD;  Location: [place] [facility];  Service: Cardiovascular;  Laterality: N/A;   RIGHT/LEFT HEART CATH AND CORONARY ANGIOGRAPHY N/A 11/23/2016   Procedure: Right/Left Heart Cath and Coronary Angiography;  Surgeon: Larey Dresser, MD;  Location: [place] [facility];  Service: Cardiovascular;  Laterality: N/A;   TEE WITHOUT CARDIOVERSION N/A 06/12/2017   Procedure: TRANSESOPHAGEAL ECHOCARDIOGRAM (TEE);  Surgeon: Prescott Gum, Collier Salina, MD;  Location: [place];  Service: Open Heart Surgery;  Laterality: N/A;   TEE WITHOUT CARDIOVERSION N/A 02/28/2018   Procedure: TRANSESOPHAGEAL ECHOCARDIOGRAM (TEE);  Surgeon: Prescott Gum, Collier Salina, MD;  Location: [place];  Service: Open Heart Surgery;  Laterality: N/A;   TEMPORARY DIALYSIS CATHETER Left 05/24/2018   Procedure: TEMPORARY DIALYSIS CATHETER;  Surgeon: Marty Heck, MD;  Location: [place] [facility];  Service: Cardiovascular;  Laterality: Left;   THROMBECTOMY AND REVISION OF ARTERIOVENTOUS (AV) GORETEX  GRAFT Right 12/12/2017   Procedure: THROMBECTOMY ARTERIOVENTOUS (AV) GORETEX  GRAFT RIGHT UPPER ARM;  Surgeon: Conrad Cobb, MD;  [facility];  Service: Vascular;  Laterality: Right;    Family History: Family History  Problem Relation Age of Onset   Colon polyps Mother    Diabetes Mother    Heart attack Father    Diabetes Father    Stroke Paternal Grandfather    Colitis Sister    Heart disease Brother    Colitis Sister    Colon cancer Neg Hx     Social History: Social History   Socioeconomic History   Marital status: Married    Spouse name: Not on file   Number of children: Not on file   Years of education: Not on file   Highest education level: Not on file  Occupational History   Not on file  Social Needs  Financial resource strain: Not on file   Food insecurity    Worry: Not on file    Inability: Not on file   Transportation needs    Medical: Not on file    Non-medical: Not  on file  Tobacco Use   Smoking status: Never Smoker   Smokeless tobacco: Never Used  Substance and Sexual Activity   Alcohol use: No   Drug use: No   Sexual activity: Never  Lifestyle   Physical activity    Days per week: Not on file    Minutes per session: Not on file   Stress: Not on file  Relationships   Social connections    Talks on phone: Not on file    Gets together: Not on file    Attends religious service: Not on file    Active member of club or organization: Not on file    Attends meetings of clubs or organizations: Not on file    Relationship status: Not on file  Other Topics Concern   Not on file  Social History Narrative   Not on file    Allergies:  Allergies  Allergen Reactions   Metformin And Related Diarrhea    Objective:    Vital Signs:   Temp:  [98.1 F (36.7 C)-98.4 F (36.9 C)] 98.4 F (36.9 C) (07/22 1349) Pulse Rate:  [72] 72 (07/22 1349) Resp:  [15-25] 15 (07/22 1415) BP: (141-148)/(99-111) 148/111 (07/22 1415) SpO2:  [100 %] 100 % (07/22 1349)   There were no vitals filed for this visit.  Mean arterial Pressure 70-80s  Physical Exam    General:  Appears chronically ill.  No resp difficulty HEENT: Normal Neck: supple. JVP flat . Carotids 2+ bilat; no bruits. No lymphadenopathy or thyromegaly appreciated. Cor: Mechanical heart sounds with LVAD hum present. Lungs: Clear Abdomen: soft, nontender, nondistended. No hepatosplenomegaly. No bruits or masses. Good bowel sounds. Driveline: C/D/I; securement device intact and driveline incorporated Extremities: no cyanosis, clubbing, rash, edema. R foot great toe, 2nd 3rd toe black. R foot tender to touch. Able to doppler R pedal pulse.  Neuro: alert & orientedx3, cranial nerves grossly intact. moves all 4 extremities w/o difficulty. Affect   Telemetry  NSR 80s   EKG     Labs    Basic Metabolic Panel: Recent Labs  Lab 03/21/19 0533 03/22/19 1341 03/27/19 1351  NA 133*  133* 135  K 3.7 3.8 4.0  CL 95* 97* 95*  CO2 23 22 23   GLUCOSE 119* 166* 114*  BUN 32* 52* 14  CREATININE 7.07* 10.06* 4.66*  CALCIUM 8.6* 8.7* 9.8  PHOS 5.4* 6.1*  --     Liver Function Tests: Recent Labs  Lab 03/22/19 1341 03/27/19 1351  AST  --  28  ALT  --  9  ALKPHOS  --  105  BILITOT  --  0.7  PROT  --  8.3*  ALBUMIN 2.6* 3.1*   No results for input(s): LIPASE, AMYLASE in the last 168 hours. No results for input(s): AMMONIA in the last 168 hours.  CBC: Recent Labs  Lab 03/21/19 0850 03/22/19 0225 03/22/19 1341 03/27/19 1351  WBC 14.6* 16.1* 15.4* 17.1*  NEUTROABS  --   --   --  14.9*  HGB 8.6* 8.9* 8.6* 9.9*  HCT 27.0* 27.4* 26.2* 31.5*  MCV 92.8 91.6 92.9 94.9  PLT 219 221 230 322    Cardiac Enzymes: No results for input(s): CKTOTAL, CKMB, CKMBINDEX, TROPONINI in the last 168 hours.  BNP: BNP (last 3 results) No results for input(s): BNP in the last 8760 hours.  ProBNP (last 3 results) No results for input(s): PROBNP in the last 8760 hours.   CBG: Recent Labs  Lab 03/21/19 1625 03/21/19 2143 03/21/19 2213 03/22/19 0610 03/22/19 1131  GLUCAP 99 59* 92 122* 129*    Coagulation Studies: Recent Labs    03/27/19 1351  LABPROT 36.9*  INR 3.8*    Other results:    Imaging     No results found.     Assessment/Plan:    1. Nausea/Vomiting Known gastroparesis.  Receive phenergan and ativan. im in the ED.  Will need to place central line.   2. R Foot Pain/PAD S/P 4th toe amputation with recent stent to SFA and PTSCA of PT.  Will ask VVS to consult due to concern ischemia.  R Foot warm able to doppler pedal pulse but great toes,2nd, 3rd 5th toe tips black with some concern for ischemic changes extending to forefoot.   3. Chronic Heart Failure with HMIII VAD From HF perspective stable.  - Hold midodrine for now until he is able to take pos.  -LDH stable. INR 3.8.   - Pharmacy consulted coumadin. Check INR in am.   4.  ESRD Had HD today. Consult renal while hospitalized. Dr Jonnie Finner will see tomorrow.   5. ID  WBC 17. Obtain Blood CX x2 Check CBC in am.    I reviewed the LVAD parameters from today, and compared the results to the patient's prior recorded data.  No programming changes were made.  The LVAD is functioning within specified parameters.  The patient performs LVAD self-test daily.  LVAD interrogation was negative for any significant power changes, alarms or PI events/speed drops.  LVAD equipment check completed and is in good working order.  Back-up equipment present.   LVAD education done on emergency procedures and precautions and reviewed exit site care.  Length of Stay: 0  Darrick Grinder, NP 03/27/2019, 3:50 PM  VAD Team Pager [phone] (7am - 7am) +++VAD ISSUES ONLY+++   Advanced Heart Failure Team Pager [phone] (M-F; 7a - 4p)  Please contact [facility] for night-coverage after hours (4p -7a ) and weekends on [email or web address] for all non- LVAD Issues  Patient seen and examined with Darrick Grinder, NP. We discussed all aspects of the encounter. I agree with the assessment and plan as stated above.   53 y/o male with severe systolic HF s/p HM-3 VAD course c/b ESRD and severe gastroparesis with frequent admits for intractable n/v. Most recently admitted for ischemic R foot and underwent toe amputation.   Presents today with recurrent n/v, leukocytosis and ischemic changes in right foot. He has no IV access  On exam. Ill appearing. Wretching JVP ok  Cor LVAD hum Lungs ok Ab soft NT Ext no edema. Warm  R foot s/p amputation with some ecchymosis and discoloration   Admit ICU for treatment of severe gastroparesis. Place central line. Check BCx. Notify renal for ESRD management. VVS to see to evaluate R foot.   VAD interrogated personally. Parameters stable.  [name], MD  6:49 PM

## 2019-03-27 NOTE — ED Triage Notes (Signed)
Pt is LVAD pt. Pt reports nausea/vomiting and R lower abdominal pain since yesterday. Pt sent over by PCP.

## 2019-03-27 NOTE — H&P (Addendum)
Central Venous Catheter Insertion Procedure Note [name] [id number] [birth year]    Procedure: Insertion of Central Venous Catheter Indications: Drug and/or fluid administration   Procedure Details Consent: Risks of procedure as well as the alternatives and risks of each were explained to the (patient/caregiver).  Consent for procedure obtained. Time Out: Verified patient identification, verified procedure, site/side was marked, verified correct patient position, special equipment/implants available, medications/allergies/relevent history reviewed, required imaging and test results available.  Performed   Maximum sterile technique was used including antiseptics, cap, gloves, gown, hand hygiene, mask and sheet. Skin prep: Chlorhexidine; local anesthetic administered A antimicrobial bonded/coated triple lumen catheter was placed in the right internal jugular vein using the Seldinger technique and u/s guidance. There was extensive scar tissue at the site which was disrupted with a scalpel. A mattress suture was placed at the line exit site to try and limit bleeding.    Evaluation Blood flow good Complications: No apparent complications Patient did tolerate procedure well. Chest X-ray ordered to verify placement.  CXR: Good placement. No PTX. Personally reviewed    [name], MD  6:51 PM

## 2019-03-27 NOTE — ED Notes (Signed)
IV team at bedside 

## 2019-03-27 NOTE — ED Notes (Signed)
LVAD Coordinator notifed

## 2019-03-27 NOTE — ED Notes (Signed)
RN attempted to stick x 3. IV team consulted

## 2019-03-27 NOTE — Consult Note (Signed)
Patient name: [name] MRN: [id number] DOB: 12/29/1965 Sex: male    HPI: [name] is a 53 y.o. male requested to be seen in the emergency room.  Chronically ill with a multitude of medical problems.  Patient has left ventricular assist device.  Chronic renal failure.  Underwent recent intervention with Dr. Donzetta Matters on 03/21/2019.  Right superficial femoral artery stenting and retrograde access and angioplasty of right posterior tibial artery and right fourth toe amputation.  Seen for follow-up of toe amputation.  Patient presents to the emergency room with gastroparesis with abdominal pain nausea and vomiting4  Current Facility-Administered Medications  Medication Dose Route Frequency Provider Last Rate Last Dose  . fentaNYL (SUBLIMAZE) injection 50 mcg  50 mcg Intravenous Once Quintella Reichert, MD      . oxyCODONE (Oxy IR/ROXICODONE) immediate release tablet 5 mg  5 mg Oral Q4H PRN Clegg, Amy D, NP       Current Outpatient Medications  Medication Sig Dispense Refill  . acetaminophen (TYLENOL) 325 MG tablet Take 2 tablets (650 mg total) by mouth every 4 (four) hours as needed for headache or mild pain.    [name] aspirin EC 81 MG tablet Take 1 tablet (81 mg total) by mouth [name]. 90 tablet 3  . docusate sodium (COLACE) 100 MG capsule Take 1 capsule (100 mg total) by mouth [name] as needed for mild constipation. 10 capsule 0  . gabapentin (NEURONTIN) 100 MG capsule Take 1 capsule (100 mg total) by mouth 2 (two) times [name]. (Patient taking differently: Take 100 mg by mouth 3 (three) times [name]. ) 60 capsule 3  . insulin glargine (LANTUS) 100 UNIT/ML injection Inject 0.1 mLs (10 Units total) into the skin [name]. (Patient taking differently: Inject 5 Units into the skin [name]. ) 10 mL 5  . midodrine (PROAMATINE) 5 MG tablet Take 1 tablet (5 mg total) by mouth [name]. Take 1 tablet before dialysis treatment 30 tablet 6  . multivitamin (RENA-VIT) TABS tablet  Take 1 tablet by mouth [name].    [name] NOVOLOG 100 UNIT/ML injection INJECT 7 UNITS INTO THE SKIN [name] WITH SUPPER. (Patient taking differently: Inject 7 Units into the skin [name] with supper. ) 10 mL 3  . oxyCODONE (ROXICODONE) 5 MG immediate release tablet Take 1-2 tablets every 4-6 hours for severe pain. 15 tablet 0  . promethazine (PHENERGAN) 12.5 MG tablet Take 1 tablet (12.5 mg total) by mouth every 6 (six) hours as needed for nausea or vomiting. 30 tablet 3  . warfarin (COUMADIN) 5 MG tablet TAKE AS FOLLOWS: MON-WED-FRI 1&1/2 TABLETS [name]. ON ALL OTHER DAYS 1 TABLET (Patient taking differently: Take 5-705 mg by mouth one time only at 6 PM. 7.5 mg  Take Tuesday and Thursday 5 mg all the other days) 50 tablet 6  . atorvastatin (LIPITOR) 40 MG tablet Take 1 tablet (40 mg total) by mouth [name] at 6 PM. (Patient not taking: Reported on 03/27/2019) 30 tablet 6  . calcitRIOL (ROCALTROL) 0.5 MCG capsule Take 1 capsule (0.5 mcg total) by mouth every Monday, Wednesday, and Friday. (Patient not taking: Reported on 03/27/2019) 15 capsule 5  . citalopram (CELEXA) 20 MG tablet Take 1 tablet (20 mg total) by mouth [name]. (Patient not taking: Reported on 03/27/2019) 30 tablet 6  . dronabinol (MARINOL) 2.5 MG capsule Take 1 capsule (2.5 mg total) by mouth 2 (two) times [name] before lunch and supper. (Patient not taking: Reported on 03/27/2019) 60 capsule 5  . erythromycin (E-MYCIN) 250 MG  tablet Take 1 tablet (250 mg total) by mouth 3 (three) times [name]. (Patient not taking: Reported on 03/27/2019) 90 tablet 4  . LORazepam (ATIVAN) 0.5 MG tablet Take 1 tablet (0.5 mg total) by mouth every 12 (twelve) hours as needed (nausea). (Patient not taking: Reported on 03/27/2019) 60 tablet 2  . metoCLOPramide (REGLAN) 10 MG tablet Take 1 tablet (10 mg total) by mouth 3 (three) times [name]. (Patient not taking: Reported on 03/27/2019) 90 tablet 6  . traMADol (ULTRAM) 50 MG tablet Take 1 tablet (50 mg total) by mouth every 6 (six)  hours as needed for moderate pain. (Patient not taking: Reported on 03/27/2019) 30 tablet 0     PHYSICAL EXAM: Vitals:   03/27/19 1348 03/27/19 1349 03/27/19 1400 03/27/19 1415  BP: (!) 141/99 (!) 141/99 (!) 148/103 (!) 148/111  Pulse:  72    Resp: (!) 22 (!) 21 (!) 25 15  Temp:  98.4 F (36.9 C)    TempSrc:  Oral    SpO2:  100%      GENERAL: The patient is a well-nourished male, in no acute distress. The vital signs are documented above. I do not palpate a right popliteal pulse.  He has biphasic posterior tibial and monophasic anterior tibial flow by Doppler on the right.  He does have fourth toe amputation on the right with some erythema and superficial blistering on the right dorsum of his foot  MEDICAL ISSUES: 6 days status post right SFA and posterior tibial intervention and fourth toe amputation.  No evidence of infection.  We will follow with you while he is an inpatient   Rosetta Posner, MD [facility] Tel [phone] Pager [phone]

## 2019-03-28 ENCOUNTER — Encounter (HOSPITAL_COMMUNITY): Payer: Self-pay

## 2019-03-28 DIAGNOSIS — I950 Idiopathic hypotension: Secondary | ICD-10-CM

## 2019-03-28 LAB — CBC
HCT: 26.4 % — ABNORMAL LOW (ref 39.0–52.0)
HCT: 21.2 % — ABNORMAL LOW (ref 39.0–52.0)
HCT: 20.5 % — ABNORMAL LOW (ref 39.0–52.0)
HCT: 22.3 % — ABNORMAL LOW (ref 39.0–52.0)
Hemoglobin: 8.4 g/dL — ABNORMAL LOW (ref 13.0–17.0)
Hemoglobin: 6.6 g/dL — CL (ref 13.0–17.0)
Hemoglobin: 6.5 g/dL — CL (ref 13.0–17.0)
Hemoglobin: 7.3 g/dL — ABNORMAL LOW (ref 13.0–17.0)
MCH: 30.1 pg (ref 26.0–34.0)
MCH: 29.7 pg (ref 26.0–34.0)
MCH: 30.1 pg (ref 26.0–34.0)
MCH: 30.7 pg (ref 26.0–34.0)
MCHC: 31.8 g/dL (ref 30.0–36.0)
MCHC: 31.1 g/dL (ref 30.0–36.0)
MCHC: 31.7 g/dL (ref 30.0–36.0)
MCHC: 32.7 g/dL (ref 30.0–36.0)
MCV: 94.6 fL (ref 80.0–100.0)
MCV: 95.5 fL (ref 80.0–100.0)
MCV: 94.9 fL (ref 80.0–100.0)
MCV: 93.7 fL (ref 80.0–100.0)
Platelets: 313 10*3/uL (ref 150–400)
Platelets: 302 10*3/uL (ref 150–400)
Platelets: 293 10*3/uL (ref 150–400)
Platelets: 244 10*3/uL (ref 150–400)
RBC: 2.79 MIL/uL — ABNORMAL LOW (ref 4.22–5.81)
RBC: 2.22 MIL/uL — ABNORMAL LOW (ref 4.22–5.81)
RBC: 2.16 MIL/uL — ABNORMAL LOW (ref 4.22–5.81)
RBC: 2.38 MIL/uL — ABNORMAL LOW (ref 4.22–5.81)
RDW: 15.5 % (ref 11.5–15.5)
RDW: 15.5 % (ref 11.5–15.5)
RDW: 15.5 % (ref 11.5–15.5)
RDW: 14.8 % (ref 11.5–15.5)
WBC: 18.6 10*3/uL — ABNORMAL HIGH (ref 4.0–10.5)
WBC: 19.9 10*3/uL — ABNORMAL HIGH (ref 4.0–10.5)
WBC: 19.2 10*3/uL — ABNORMAL HIGH (ref 4.0–10.5)
WBC: 18.4 10*3/uL — ABNORMAL HIGH (ref 4.0–10.5)
nRBC: 0.5 % — ABNORMAL HIGH (ref 0.0–0.2)
nRBC: 1.2 % — ABNORMAL HIGH (ref 0.0–0.2)
nRBC: 1.5 % — ABNORMAL HIGH (ref 0.0–0.2)
nRBC: 1.3 % — ABNORMAL HIGH (ref 0.0–0.2)

## 2019-03-28 LAB — BASIC METABOLIC PANEL
Anion gap: 20 — ABNORMAL HIGH (ref 5–15)
BUN: 25 mg/dL — ABNORMAL HIGH (ref 6–20)
CO2: 22 mmol/L (ref 22–32)
Calcium: 9.2 mg/dL (ref 8.9–10.3)
Chloride: 92 mmol/L — ABNORMAL LOW (ref 98–111)
Creatinine, Ser: 6.47 mg/dL — ABNORMAL HIGH (ref 0.61–1.24)
GFR calc Af Amer: 10 mL/min — ABNORMAL LOW (ref >60)
GFR calc non Af Amer: 9 mL/min — ABNORMAL LOW (ref >60)
Glucose, Bld: 164 mg/dL — ABNORMAL HIGH (ref 70–99)
Potassium: 4.7 mmol/L (ref 3.5–5.1)
Sodium: 134 mmol/L — ABNORMAL LOW (ref 135–145)

## 2019-03-28 LAB — GLUCOSE, CAPILLARY
Glucose-Capillary: 161 mg/dL — ABNORMAL HIGH (ref 70–99)
Glucose-Capillary: 191 mg/dL — ABNORMAL HIGH (ref 70–99)
Glucose-Capillary: 214 mg/dL — ABNORMAL HIGH (ref 70–99)
Glucose-Capillary: 231 mg/dL — ABNORMAL HIGH (ref 70–99)
Glucose-Capillary: 164 mg/dL — ABNORMAL HIGH (ref 70–99)
Glucose-Capillary: 146 mg/dL — ABNORMAL HIGH (ref 70–99)

## 2019-03-28 LAB — MRSA PCR SCREENING: MRSA by PCR: NEGATIVE

## 2019-03-28 LAB — LACTIC ACID, PLASMA: Lactic Acid, Venous: 1.1 mmol/L (ref 0.5–1.9)

## 2019-03-28 LAB — PREPARE RBC (CROSSMATCH)

## 2019-03-28 LAB — LACTATE DEHYDROGENASE: LDH: 181 U/L (ref 98–192)

## 2019-03-28 LAB — PROTIME-INR
INR: 4 — ABNORMAL HIGH (ref 0.8–1.2)
Prothrombin Time: 38.4 seconds — ABNORMAL HIGH (ref 11.4–15.2)

## 2019-03-28 MED ORDER — PROMETHAZINE HCL 25 MG PO TABS
12.5000 mg | ORAL_TABLET | Freq: Four times a day (QID) | ORAL | Status: DC | PRN
  Filled 2019-03-28: qty 1

## 2019-03-28 MED ORDER — SODIUM CHLORIDE 0.9 % IV SOLN
1.0000 g | INTRAVENOUS | Status: DC
  Administered 2019-03-28 – 2019-04-01 (×5): 1 g via INTRAVENOUS
  Filled 2019-03-28 (×6): qty 1

## 2019-03-28 MED ORDER — SODIUM CHLORIDE 0.9 % IV BOLUS
500.0000 mL | Freq: Once | INTRAVENOUS | Status: AC
  Administered 2019-03-28: 05:00:00 500 mL via INTRAVENOUS

## 2019-03-28 MED ORDER — VANCOMYCIN HCL 10 G IV SOLR
1750.0000 mg | Freq: Once | INTRAVENOUS | Status: AC
  Administered 2019-03-28: 1750 mg via INTRAVENOUS
  Filled 2019-03-28: qty 1750

## 2019-03-28 MED ORDER — PROMETHAZINE HCL 25 MG/ML IJ SOLN
12.5000 mg | Freq: Four times a day (QID) | INTRAMUSCULAR | Status: DC | PRN
  Administered 2019-03-29 – 2019-04-02 (×2): 12.5 mg via INTRAVENOUS
  Filled 2019-03-28 (×2): qty 1

## 2019-03-28 MED ORDER — "THROMBI-PAD 3""X3"" EX PADS"
1.0000 | MEDICATED_PAD | Freq: Once | CUTANEOUS | Status: AC
  Administered 2019-03-28: 1 via TOPICAL
  Filled 2019-03-28: qty 1

## 2019-03-28 MED ORDER — CHLORHEXIDINE GLUCONATE CLOTH 2 % EX PADS
6.0000 | MEDICATED_PAD | Freq: Every day | CUTANEOUS | Status: DC
  Administered 2019-03-29 – 2019-04-03 (×3): 6 via TOPICAL

## 2019-03-28 MED ORDER — SODIUM CHLORIDE 0.9% FLUSH
10.0000 mL | Freq: Two times a day (BID) | INTRAVENOUS | Status: DC
  Administered 2019-03-28 – 2019-03-30 (×7): 10 mL
  Administered 2019-03-31: 30 mL
  Administered 2019-03-31 – 2019-04-01 (×3): 10 mL
  Administered 2019-04-02 – 2019-04-03 (×2): 20 mL
  Administered 2019-04-03: 10 mL

## 2019-03-28 MED ORDER — PROMETHAZINE HCL 12.5 MG RE SUPP
12.5000 mg | Freq: Four times a day (QID) | RECTAL | Status: DC | PRN
  Filled 2019-03-28: qty 1

## 2019-03-28 MED ORDER — FENTANYL CITRATE (PF) 100 MCG/2ML IJ SOLN
25.0000 ug | Freq: Four times a day (QID) | INTRAMUSCULAR | Status: DC | PRN
  Administered 2019-03-28 – 2019-04-02 (×8): 25 ug via INTRAVENOUS
  Filled 2019-03-28 (×8): qty 2

## 2019-03-28 MED ORDER — VASOPRESSIN 20 UNIT/ML IV SOLN
0.0300 [IU]/min | INTRAVENOUS | Status: DC
  Administered 2019-03-28: 0.03 [IU]/min via INTRAVENOUS
  Filled 2019-03-28 (×2): qty 2

## 2019-03-28 MED ORDER — SODIUM CHLORIDE 0.9% IV SOLUTION
Freq: Once | INTRAVENOUS | Status: AC
  Administered 2019-03-28: 15:00:00 via INTRAVENOUS

## 2019-03-28 MED ORDER — VANCOMYCIN HCL IN DEXTROSE 1-5 GM/200ML-% IV SOLN
1000.0000 mg | INTRAVENOUS | Status: DC
  Administered 2019-03-29 – 2019-04-03 (×3): 1000 mg via INTRAVENOUS
  Filled 2019-03-28 (×2): qty 200

## 2019-03-28 MED ORDER — INSULIN ASPART 100 UNIT/ML ~~LOC~~ SOLN
0.0000 [IU] | Freq: Three times a day (TID) | SUBCUTANEOUS | Status: DC
  Administered 2019-03-28 (×2): 5 [IU] via SUBCUTANEOUS
  Administered 2019-03-29 – 2019-03-30 (×4): 2 [IU] via SUBCUTANEOUS
  Administered 2019-03-30: 14:00:00 3 [IU] via SUBCUTANEOUS
  Administered 2019-03-31: 17:00:00 2 [IU] via SUBCUTANEOUS
  Administered 2019-03-31: 3 [IU] via SUBCUTANEOUS
  Administered 2019-03-31: 2 [IU] via SUBCUTANEOUS
  Administered 2019-04-01 – 2019-04-02 (×2): 3 [IU] via SUBCUTANEOUS
  Administered 2019-04-02 – 2019-04-03 (×2): 2 [IU] via SUBCUTANEOUS
  Administered 2019-04-03: 3 [IU] via SUBCUTANEOUS

## 2019-03-28 MED ORDER — CHLORHEXIDINE GLUCONATE CLOTH 2 % EX PADS
6.0000 | MEDICATED_PAD | Freq: Every day | CUTANEOUS | Status: DC
  Administered 2019-03-28 – 2019-04-03 (×5): 6 via TOPICAL

## 2019-03-28 MED ORDER — SODIUM CHLORIDE 0.9% FLUSH: 10.0000 mL | INTRAVENOUS | Status: DC | PRN

## 2019-03-28 MED ORDER — LORAZEPAM 2 MG/ML IJ SOLN
0.5000 mg | Freq: Four times a day (QID) | INTRAMUSCULAR | Status: DC | PRN
  Administered 2019-03-29 – 2019-04-03 (×9): 0.5 mg via INTRAVENOUS
  Filled 2019-03-28 (×8): qty 1

## 2019-03-28 MED ORDER — LIDOCAINE-EPINEPHRINE (PF) 1 %-1:200000 IJ SOLN
10.0000 mL | Freq: Once | INTRAMUSCULAR | Status: AC
  Administered 2019-03-28: 10 mL
  Filled 2019-03-28: qty 10

## 2019-03-28 MED ORDER — INSULIN ASPART 100 UNIT/ML ~~LOC~~ SOLN
0.0000 [IU] | Freq: Every day | SUBCUTANEOUS | Status: DC
  Administered 2019-03-30: 2 [IU] via SUBCUTANEOUS

## 2019-03-28 MED ORDER — METOCLOPRAMIDE HCL 10 MG PO TABS
10.0000 mg | ORAL_TABLET | Freq: Three times a day (TID) | ORAL | Status: DC
  Administered 2019-03-28 – 2019-04-04 (×17): 10 mg via ORAL
  Filled 2019-03-28 (×18): qty 1

## 2019-03-28 MED ORDER — ERYTHROMYCIN BASE 250 MG PO TABS
250.0000 mg | ORAL_TABLET | Freq: Three times a day (TID) | ORAL | Status: DC
  Administered 2019-03-29 – 2019-04-04 (×16): 250 mg via ORAL
  Filled 2019-03-28 (×19): qty 1

## 2019-03-28 NOTE — Progress Notes (Signed)
Renal Navigator notified OP HD clinic/[facility] of patient's admission and negative COVID 19 rapid test result to provide continuity of care and safety.  Alphonzo Cruise,  Renal Navigator [phone]

## 2019-03-28 NOTE — Progress Notes (Signed)
R IJ CVC placed right before evening shift change on 7/22. At shift change, oozing from CVC site was reported in RN shift handoff. CVC site has continued to ooze all night despite being re-dressed multiple times with Thrombi pads and application of manual pressure to site. Cards fellow [facility] MD came to bedside to assess around 2300 and advised continued use of Thrombi pads and manual pressure. Pt continued to have oozing from site but VS and VAD numbers remained stable.   Since 0330 this morning, pt's MAPs and Doppler pressures have sustained in the 60s. At this time, VAD pump flows also showed sustained decrease to 2.7-3.3 lpm, with PIs occasionally 6-7. VAD event log shows multiple PI events but there have been no alarms. CVP measured at 2-3. Hgb 8.4 this AM and INR up to 4. VAD coordinator [name] paged and relayed the above information. Ebony Hail received orders from Dr. Haroldine Laws to administer 500 cc NS bolus and start vasopressin at 0.03 if pt's MAPs still low after bolus. Will implement orders received and continue to monitor pt.  Nelva Bush RN

## 2019-03-28 NOTE — Progress Notes (Addendum)
Advanced Heart Failure VAD Team Note  PCP-Cardiologist: No primary care provider on file.   Subjective:   Admitted with intractable N/V and R foot pain.   Overnight Maps low. Given 500 cc NS. Started vasopressin. Received ativan 1 mg , phenergan 25 mg x2 , and fentanyl 25 mcg last night.   Oozing from [place]. Denies SOB. Asking for pain meds.    LVAD INTERROGATION:  HeartMate 3 LVAD:   Flow 2.8 liters/min, speed 5500 , power 4.1 , PI 7.3 .  3 low flows overnight  Objective:    Vital Signs:   Temp:  [98.1 F (36.7 C)-98.9 F (37.2 C)] 98.2 F (36.8 C) (07/23 0700) Pulse Rate:  [27-100] 42 (07/23 0630) Resp:  [9-27] 21 (07/23 0630) BP: (45-157)/(23-123) 72/55 (07/23 0650) SpO2:  [70 %-100 %] 94 % (07/23 0630) Weight:  [83 kg] 83 kg (07/23 0205)   Mean arterial Pressure 66 personally checked with doppler   Intake/Output:   Intake/Output Summary (Last 24 hours) at 03/28/2019 0803 Last data filed at 03/28/2019 0600 Gross per 24 hour  Intake 500.13 ml  Output -  Net 500.13 ml     Physical Exam    General: Appears chronically ill.  HEENT: normal Neck: supple. JVP flat . Carotids 2+ bilat; no bruits. No lymphadenopathy or thyromegaly appreciated. RIJ oozing persistently. Cor: Mechanical heart sounds with LVAD hum present. Lungs: clear Abdomen: soft, nontender, nondistended. No hepatosplenomegaly. No bruits or masses. Good bowel sounds. Driveline: C/D/I; securement device intact and driveline incorporated Extremities: no cyanosis, clubbing, rash, edema. R foot great toe, 2nd toe, 3rd, 5th toe tip black. Fore foot erythema.  Neuro: alert & orientedx3, cranial nerves grossly intact. moves all 4 extremities w/o difficulty. Affect pleasant   Telemetry   NSR with PVCs  EKG    N/a   Labs   Basic Metabolic Panel: Recent Labs  Lab 03/22/19 1341 03/27/19 1351 03/28/19 0318  NA 133* 135 134*  K 3.8 4.0 4.7  CL 97* 95* 92*  CO2 22 23 22   GLUCOSE 166* 114* 164*  BUN  52* 14 25*  CREATININE 10.06* 4.66* 6.47*  CALCIUM 8.7* 9.8 9.2  PHOS 6.1*  --   --     Liver Function Tests: Recent Labs  Lab 03/22/19 1341 03/27/19 1351  AST  --  28  ALT  --  9  ALKPHOS  --  105  BILITOT  --  0.7  PROT  --  8.3*  ALBUMIN 2.6* 3.1*   No results for input(s): LIPASE, AMYLASE in the last 168 hours. No results for input(s): AMMONIA in the last 168 hours.  CBC: Recent Labs  Lab 03/22/19 0225 03/22/19 1341 03/27/19 1351 03/27/19 2044 03/28/19 0318  WBC 16.1* 15.4* 17.1* 15.8* 18.6*  NEUTROABS  --   --  14.9*  --   --   HGB 8.9* 8.6* 9.9* 9.3* 8.4*  HCT 27.4* 26.2* 31.5* 29.3* 26.4*  MCV 91.6 92.9 94.9 94.2 94.6  PLT 221 230 322 307 313    INR: Recent Labs  Lab 03/22/19 0225 03/27/19 1351 03/27/19 2044 03/28/19 0318  INR 1.9* 3.8* 3.3* 4.0*    Other results:  EKG:    Imaging   Dg Chest Port 1 View  Result Date: 03/27/2019 CLINICAL DATA:  Central line placement, history hypertension, diabetes mellitus, CHF, coronary artery disease, LVAD EXAM: PORTABLE CHEST 1 VIEW COMPARISON:  Portable exam 1839 hours compared to 03/15/2019 FINDINGS: LVAD projects over cardiac apex. Multiple coronary stents noted.  RIGHT jugular central venous catheter with tip projecting over cavoatrial junction. LEFT jugular dual-lumen central venous catheter with tip projecting over cavoatrial junction. LEFT subclavian AICD with leads projecting over RIGHT atrium and RIGHT ventricle. Enlargement of cardiac silhouette. Mediastinal contours and pulmonary vascularity normal. Decreased lung volumes with minimal bibasilar atelectasis. No infiltrate, pleural effusion or pneumothorax. IMPRESSION: Bibasilar atelectasis. No pneumothorax. Electronically Signed   By: Lavonia Dana M.D.   On: 03/27/2019 19:07      Medications:     Scheduled Medications: . aspirin EC  81 mg Oral Daily  . calcitRIOL  0.5 mcg Oral Q M,W,F  . Chlorhexidine Gluconate Cloth  6 each Topical Daily  .  dronabinol  2.5 mg Oral BID AC  . fentaNYL (SUBLIMAZE) injection  50 mcg Intravenous Once  . gabapentin  100 mg Oral TID  . insulin aspart  7 Units Subcutaneous Q supper  . insulin glargine  5 Units Subcutaneous QHS  . lidocaine-EPINEPHrine  10 mL Infiltration Once  . multivitamin  1 tablet Oral QHS  . sodium chloride flush  10-40 mL Intracatheter Q12H     Infusions: . vasopressin (PITRESSIN) infusion - *FOR SHOCK* 0.03 Units/min (03/28/19 0618)     PRN Medications:  acetaminophen, docusate sodium, fentaNYL (SUBLIMAZE) injection, hydrALAZINE, LORazepam, ondansetron (ZOFRAN) IV, oxyCODONE, promethazine **OR** promethazine **OR** promethazine, sodium chloride flush, traMADol    Assessment/Plan:    1. Nausea/Vomiting Known gastroparesis.  Continue phenergan and ativan.    2. R Foot Pain/PAD S/P 4th toe amputation with recent stent to SFA and PTSCA of PT.  R Foot warm able to doppler pedal pulse but great toes,2nd, 3rd 5th toe tips black with some concern for ischemic changes extending to forefoot.  VVS appreciated. No change.   3. Chronic Heart Failure with HMIII VAD From HF perspective stable.  - Hold midodrine for now until he is able to take pos.  -LDH stable. INR 4 - Pharmacy consulted coumadin. Holding coumadin until INR comes down.    4. ESRD Had HD 7/22.   Consult renal while hospitalized. Dr Jonnie Finner aware.   5. ID  WBC 17>18.6   Blood CX x2- NGTD  6. Anemia  Hgb trending down from 9.3> 8.4, Oozing form RIJ site. Get type and screen.    7. Hypotension This morning received 500 cc NS bolus x2. Started on vasopressin.  Receiving phenergan, ativan, nausea.  Watch BP    I reviewed the LVAD parameters from today, and compared the results to the patient's prior recorded data.  No programming changes were made.  The LVAD is functioning within specified parameters.  The patient performs LVAD self-test daily.  LVAD interrogation was + 3 low flows. LVAD  equipment check completed and is in good working order.  Back-up equipment present.   LVAD education done on emergency procedures and precautions and reviewed exit site care.  Length of Stay: 1  Darrick Grinder, NP 03/28/2019, 8:03 AM  VAD Team --- VAD ISSUES ONLY--- Pager [phone] (7am - 7am)  Advanced Heart Failure Team  Pager [phone] (M-F; 7a - 4p)  Please contact [facility] for night-coverage after hours (4p -7a ) and weekends on [email or web address]  Had a rough night. Continues to complain of pain and intractable n/v. Asking frequently for fentanyl, phenergan and ativan.   RIJ oozing and hgb down about 1g/dL has failed pressure and thrombipad. MAPs in 50s overnight. Rec'd 500c NS and now on vasopressin and MAPs still low. Foot discoloration improved  On exam Weak  appearing JVP flat persistent bleed from RIJ site Cor LVAD hum Lungs CTA Ab soft NT Ext cool R foot s/p amputation of toe with some ecchymosis  He is tenuous and hypotensive. Will give another 500cc NS. Continue vasopressin. Can add neo as needed. I personally injected RIJ site with lido and epi and held manual pressure for over 10 minutes. Will follow site closely. Check H/H later today and keep active T&S.   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 Will need to cut back on these.   VAD interrogated personally. Parameters stable. INR 4.0. Discussed dosing with PharmD personally.  CRITICAL CARE Performed by: [name]  Total critical care time: 35 minutes  Critical care time was exclusive of separately billable procedures and treating other patients.  Critical care was necessary to treat or prevent imminent or life-threatening deterioration.  Critical care was time spent personally by me (independent of midlevel providers or residents) on the following activities: development of treatment plan with patient and/or surrogate as well as nursing, discussions with  consultants, evaluation of patient's response to treatment, examination of patient, obtaining history from patient or surrogate, ordering and performing treatments and interventions, ordering and review of laboratory studies, ordering and review of radiographic studies, pulse oximetry and re-evaluation of patient's condition.  [name], MD  9:06 AM

## 2019-03-28 NOTE — Progress Notes (Addendum)
Noted doppler MAP 58, low flow alarms. Levo turned on @ 0.03. Mentation intact.  0745: NP has ordered NS bolus 500. Will give now.  Toe amp incision well approximated, clean and dry. Erythema along top of foot.Tender to touch. +pulses. NP aware. No other complaints of pain.   No further oozing from IJ site, new dressing placed.

## 2019-03-28 NOTE — Progress Notes (Signed)
LVAD controller displayed Red Heart alarm around 6:10am. Main display indicated Low Flow. Reported to VAD coordinator [name]. Alarm not displayed on LVAD event log or alarm log on patient's controller. Pt received 500 cc bolus, vasopressin held for now d/t MAPs in 70s.  Nelva Bush RN

## 2019-03-28 NOTE — Progress Notes (Signed)
Pharmacy Antibiotic Note  [name] is a 53 y.o. male admitted on 03/27/2019 with cellulitis.  Pharmacy has been consulted for vancomycin and cefepime dosing.  Vascular noted some erythema and superficial blistering on R dorsum of his foot. WBC 18.6, afebrile. LA 1.1. Has been hypotensive - on vasopressin.  Plan: Vancomycin 1750 mg IV once then 1000 mg IV every dialysis on MWF Cefepime 1 gram every 24 hours Monitor renal fx, cx results, clinical pic, and vanc levels as appropriate.  Weight: 182 lb 15.7 oz (83 kg)(LVAD controller in bed w/ pt)  Temp (24hrs), Avg:98.3 F (36.8 C), Min:98.1 F (36.7 C), Max:98.9 F (37.2 C)  Recent Labs  Lab 03/22/19 0225 03/22/19 1341 03/27/19 1351 03/27/19 1640 03/27/19 2044 03/28/19 0318  WBC 16.1* 15.4* 17.1*  --  15.8* 18.6*  CREATININE  --  10.06* 4.66*  --   --  6.47*  LATICACIDVEN  --   --   --  1.4  --  1.1    Estimated Creatinine Clearance: 13.5 mL/min (A) (by C-G formula based on SCr of 6.47 mg/dL (H)).    Allergies  Allergen Reactions  . Metformin And Related Diarrhea    Antimicrobials this admission: Vanc 7/23 >>  Cefepime 7/23 >>   Dose adjustments this admission: N/A  Microbiology results: 7/22 BCx: NGTD 7/22 COVID: neg  7/23 MRSA PCR: neg  Thank you for allowing pharmacy to be a part of this patient's care.  Antonietta Jewel, PharmD, [place] Clinical Pharmacist  Pager: [phone] Phone: [phone] 03/28/2019 11:06 AM

## 2019-03-28 NOTE — Progress Notes (Signed)
   HGb down to 6.5. No further bleeding from RIJ.  Give 1URPBCs now.   Check CBC after blood.   Aceton Kinnear NP-C  3:04 PM

## 2019-03-28 NOTE — Progress Notes (Addendum)
LVAD Coordinator Rounding Note:  Pt admitted per Dr. Haroldine Laws 03/27/19 for nausea, vomiting, and right foot pain.  HM III LVAD implanted on 06/12/17 by Dr. Prescott Gum under Destination Therapy criteria due to renal insufficiency. Pump exchange on 03/01/18 for outflow graft twist/obstruction.   Patient laying in bed asleep. Difficult to arouse this morning due to medications.   Vital signs: Temp:   HR:  90 Doppler Pressure:  Not done Auto BP: 110/85 (93)  O2 Sat: 100 % on RA Wt: 182.9 lbs  LVAD interrogation reveals:  Speed: 5500 Flow: 3.1 Power: 4.6w PI: 7.3 Alarms: 4 low flows  Events: 70+  Hematocrit: 29  Fixed speed: 5500 Low speed limit: 5200  Drive Line:  CDI. Weekly dressing kits.  Next dressing change due 04/03/19; bedside nurse may change dressing.   Labs: LDH trend: 181>  INR trend: 4.0>  WBC trend: 18.6>  Infection: blood cx pending   Gtts: - Vasopressin >> started 03/28/19  Anticoagulation Plan: - INR Goal: 2.0 - 3.0 - ASA Dose: 81 mg daily   Device: -St Jude dual  -Therapies: on 200 bpm - Monitor: on VT 150 bpm  Adverse Events on VAD: - ESRD post LVAD; started CVVH 06/15/17; HD started 06/20/17 - Hospitalization 11/5 - 07/16/17 > gastroparesis diagnosis after EGD - Hospitalization 11/26 - 08/04/17 > gastroparesis - Hospitalization 1/8 - 09/16/17>gastroparesis - referred to Dr. Corliss Parish at [facility] - 10/31/17>rightupper arm arteriovenous graft with Artegraft; ligation of right first stage brachial vein transposition  - 12/11/13 elective admission for thrombectomy for AV graft in RUA  - 12/20/17 > intractable N/V - 01/19/18 > intractable N/V sent by EMS from dialysis center - 03/01/18 > outflow graft twist/occulsion requiring pump exchange - 05/23/18 > admitted for non-working fistula in RUA - 06/10/18 > admitted for intractable N/V - 08/08/18 > admitted for peri-op management for 2nd stage of AV fistula, found to be sclerotic. New LUE graft created from  brachial artery to axillary vein. Developed spontaneous LUE hematoma.  - 08/22/18 > admitted for intractable N/V - 08/28/18> admitted for intractable N/V/D and low INR  Plan/Recommendations:  1. Please page VAD coordinator with any equipment questions or concerns. 2. Weekly VAD dressing changes - due 04/03/19 bedside nurse to perform.   [name] RN [place] Coordinator  Office: [phone]  24/7 Pager: [phone]

## 2019-03-28 NOTE — Consult Note (Signed)
Renal Service Consult Note [facility]  [name] 03/28/2019 [name] Requesting Physician:  Dr [name]  Reason for Consult:  ESRD pt w/ R foot ischemia HPI: The patient is a 53 y.o. year-old with hx of CAD, atrial flutter, DM2, gastroparesis, Factor V Leiden, CHF / ICM sp LVAD done in 2019.  Started on HD in 2019 as well.  Patient presented yest w/ N/Vand R lower abd pain.  R foot pain also is worse.  Pt admitted. We are asked to see for ESRD.    Patient had full HD on Wed.  Good compliance w/ HD.  Using R IJ TDC.     Admitted 7/10- 03/22/19 for R foot pain, ischemic R foot. Had aortogram 7/13 w/ stent to SFA and PTA of post tibial w/ restoration of flow.  Then had 4th toe amp on 7/16 and was dc'd home.    ROS  denies CP  no joint pain   no HA  no blurry vision  no rash  no diarrhea  no nausea/ vomiting   Past Medical History  Past Medical History:  Diagnosis Date  . Angina   . ASCVD (arteriosclerotic cardiovascular disease)    , Anterior infarction 2005, LAD diagonal bifurcation intervention 03/2004  . Automatic implantable cardiac defibrillator -St. Jude's       . Benign neoplasm of colon   . CHF (congestive heart failure) ([place])   . Chronic systolic heart failure ([place])   . Coronary artery disease     Widely patent previously placed stents in the left anterior   . Crohn's disease ([place])   . Deep venous thrombosis (HCC)    Recurrent-on Coumadin  . Dialysis patient ([place])   . Dyspnea   . Gastroparesis   . GERD (gastroesophageal reflux disease)   . High cholesterol   . Hyperlipidemia   . Hypersomnolent    Previous diagnosis of narcolepsy  . Hypertension, essential   . Ischemic cardiomyopathy    Ejection fraction 15-20% catheterization 2010  . Type II or unspecified type diabetes mellitus without mention of complication, not stated as uncontrolled   . Unspecified gastritis and gastroduodenitis without mention of hemorrhage    Past  Surgical History  Past Surgical History:  Procedure Laterality Date  . A/V SHUNTOGRAM N/A 05/24/2018   Procedure: A/V SHUNTOGRAM;  Surgeon: Marty Heck, MD;  Location: [place] [facility];  Service: Cardiovascular;  Laterality: N/A;  . ABDOMINAL AORTOGRAM W/LOWER EXTREMITY N/A 03/18/2019   Procedure: ABDOMINAL AORTOGRAM W/LOWER EXTREMITY;  Surgeon: Waynetta Sandy, MD;  Location: [place] [facility];  Service: Cardiovascular;  Laterality: N/A;  . AMPUTATION Right 03/21/2019   Procedure: AMPUTATION DIGIT RIGHT FOURTH TOE;  Surgeon: Waynetta Sandy, MD;  Location: [place];  Service: Vascular;  Laterality: Right;  . APPENDECTOMY    . AV FISTULA PLACEMENT Right 06/26/2017   Procedure: ARTERIOVENOUS (AV) FISTULA CREATION VERSUS GRAFT RIGHT  ARM;  Surgeon: Conrad Orchard, MD;  Location: [place];  Service: Vascular;  Laterality: Right;  . AV FISTULA PLACEMENT Right 10/31/2017   Procedure: INSERTION OF ARTERIOVENOUS (AV) ARTEGRAFT,  RIGHT UPPER ARM;  Surgeon: Conrad Elizaville, MD;  Location: [place];  Service: Vascular;  Laterality: Right;  . AV FISTULA PLACEMENT Left 05/29/2018   Procedure: ARTERIOVENOUS (AV) FISTULA CREATION  LEFT UPPER EXTREMITY;  Surgeon: Waynetta Sandy, MD;  Location: [place];  Service: Vascular;  Laterality: Left;  . AV FISTULA PLACEMENT Left 08/09/2018   Procedure: INSERTION  OF ARTERIOVENOUS (AV) GORE-TEX GRAFT LEFT UPPER ARM;  Surgeon: Waynetta Sandy, MD;  Location: [place];  Service: Vascular;  Laterality: Left;  . CARDIAC DEFIBRILLATOR PLACEMENT  2010   St. Jude ICD  . CENTRAL LINE INSERTION Right 05/24/2018   Procedure: CENTRAL LINE INSERTION;  Surgeon: Marty Heck, MD;  Location: [place] [facility];  Service: Cardiovascular;  Laterality: Right;  . COLECTOMY  ~ 2003   "for Crohn's" ascending colon.    . CORONARY STENT INTERVENTION N/A 11/25/2016   Procedure: Coronary Stent Intervention;  Surgeon: Leonie Man, MD;  Location: [place] [facility];  Service: Cardiovascular;  Laterality: N/A;  . EP IMPLANTABLE DEVICE N/A 09/14/2016   Procedure: ICD Generator Changeout;  Surgeon: Deboraha Sprang, MD;  Location: [place] [facility];  Service: Cardiovascular;  Laterality: N/A;  . ESOPHAGOGASTRODUODENOSCOPY N/A 07/12/2017   Procedure: ESOPHAGOGASTRODUODENOSCOPY (EGD);  Surgeon: Jerene Bears, MD;  Location: [facility];  Service: Gastroenterology;  Laterality: N/A;  VAD pt so VAD team will need to accompany pt.   [name] FETAL SURGERY FOR CONGENITAL HERNIA     ????? pt has no knowledge of this..    . FISTULOGRAM Left 08/09/2018   Procedure: FISTULOGRAM LEFT ARM;  Surgeon: Waynetta Sandy, MD;  Location: [place];  Service: Vascular;  Laterality: Left;  . INGUINAL HERNIA REPAIR    . INSERTION OF DIALYSIS CATHETER Right 06/26/2017   Procedure: INSERTION OF TUNNELED  DIALYSIS CATHETER RIGHT INTERNAL JUGULAR;  Surgeon: Conrad Penhook, MD;  Location: [place];  Service: Vascular;  Laterality: Right;  . INSERTION OF IMPLANTABLE LEFT VENTRICULAR ASSIST DEVICE N/A 06/12/2017   Procedure: INSERTION OF IMPLANTABLE LEFT VENTRICULAR ASSIST [place] 3;  Surgeon: Ivin Poot, MD;  Location: [place];  Service: Open Heart Surgery;  Laterality: N/A;  HM3 LVAD  CIRC ARREST  NITRIC OXIDE  . INSERTION OF IMPLANTABLE LEFT VENTRICULAR ASSIST DEVICE N/A 02/28/2018   Procedure: REDO INSERTION OF IMPLANTABLE LEFT VENTRICULAR ASSIST DEVICE - HEARTMATE 3;  Surgeon: Ivin Poot, MD;  Location: [place];  Service: Open Heart Surgery;  Laterality: N/A;  . IR REMOVAL TUN CV CATH W/O FL  02/26/2018  . IR THORACENTESIS ASP PLEURAL SPACE W/IMG GUIDE  06/28/2017  . LIGATION OF ARTERIOVENOUS  FISTULA Right 10/31/2017   Procedure: LIGATION OF BRACHIO-BASILIC VEIN TRANSPOSITION RIGHT ARM;  Surgeon: Conrad Spencer, MD;  Location: [place];  Service: Vascular;  Laterality: Right;  . MULTIPLE EXTRACTIONS WITH ALVEOLOPLASTY N/A 06/08/2017   Procedure: MULTIPLE  EXTRACTION WITH ALVEOLOPLASTY AND PRE PROSTHETIC SURGERY AS NEEDED;  Surgeon: Lenn Cal, DDS;  Location: [place];  Service: Oral Surgery;  Laterality: N/A;  . PERIPHERAL VASCULAR BALLOON ANGIOPLASTY  03/18/2019   Procedure: PERIPHERAL VASCULAR BALLOON ANGIOPLASTY;  Surgeon: Waynetta Sandy, MD;  Location: [place] [facility];  Service: Cardiovascular;;  RT PT  . PERIPHERAL VASCULAR INTERVENTION  03/18/2019   Procedure: PERIPHERAL VASCULAR INTERVENTION;  Surgeon: Waynetta Sandy, MD;  Location: [place] [facility];  Service: Cardiovascular;;  RT SFA  . RIGHT HEART CATH N/A 06/07/2017   Procedure: RIGHT HEART CATH;  Surgeon: Larey Dresser, MD;  Location: [place] [facility];  Service: Cardiovascular;  Laterality: N/A;  . RIGHT HEART CATH N/A 02/08/2018   Procedure: RIGHT HEART CATH;  Surgeon: Larey Dresser, MD;  Location: [place] [facility];  Service: Cardiovascular;  Laterality: N/A;  . RIGHT HEART CATH N/A 08/09/2018   Procedure: RIGHT HEART CATH;  Surgeon: Larey Dresser, MD;  Location: [place] [facility];  Service: Cardiovascular;  Laterality: N/A;  . RIGHT/LEFT HEART CATH AND CORONARY ANGIOGRAPHY N/A 11/23/2016   Procedure: Right/Left Heart Cath and Coronary Angiography;  Surgeon: Larey Dresser, MD;  Location: [place] [facility];  Service: Cardiovascular;  Laterality: N/A;  . TEE WITHOUT CARDIOVERSION N/A 06/12/2017   Procedure: TRANSESOPHAGEAL ECHOCARDIOGRAM (TEE);  Surgeon: Prescott Gum, Collier Salina, MD;  Location: [place];  Service: Open Heart Surgery;  Laterality: N/A;  . TEE WITHOUT CARDIOVERSION N/A 02/28/2018   Procedure: TRANSESOPHAGEAL ECHOCARDIOGRAM (TEE);  Surgeon: Prescott Gum, Collier Salina, MD;  Location: [place];  Service: Open Heart Surgery;  Laterality: N/A;  . TEMPORARY DIALYSIS CATHETER Left 05/24/2018   Procedure: TEMPORARY DIALYSIS CATHETER;  Surgeon: Marty Heck, MD;  Location: [place] [facility];  Service: Cardiovascular;  Laterality: Left;  . THROMBECTOMY AND  REVISION OF ARTERIOVENTOUS (AV) GORETEX  GRAFT Right 12/12/2017   Procedure: THROMBECTOMY ARTERIOVENTOUS (AV) GORETEX  GRAFT RIGHT UPPER ARM;  Surgeon: Conrad Friendship, MD;  Location: [facility] OR;  Service: Vascular;  Laterality: Right;   Family History  Family History  Problem Relation Age of Onset  . Colon polyps Mother   . Diabetes Mother   . Heart attack Father   . Diabetes Father   . Stroke Paternal Grandfather   . Colitis Sister   . Heart disease Brother   . Colitis Sister   . Colon cancer Neg Hx    Social History  reports that he has never smoked. He has never used smokeless tobacco. He reports that he does not drink alcohol or use drugs. Allergies  Allergies  Allergen Reactions  . Metformin And Related Diarrhea   Home medications Prior to Admission medications   Medication Sig Start Date End Date Taking? Authorizing Provider  acetaminophen (TYLENOL) 325 MG tablet Take 2 tablets (650 mg total) by mouth every 4 (four) hours as needed for headache or mild pain. 03/22/19  Yes Kroeger, Lorelee Cover., PA-C  aspirin EC 81 MG tablet Take 1 tablet (81 mg total) by mouth daily. 08/20/18  Yes Eileen Stanford, PA-C  docusate sodium (COLACE) 100 MG capsule Take 1 capsule (100 mg total) by mouth daily as needed for mild constipation. 03/22/19  Yes Kroeger, Lorelee Cover., PA-C  gabapentin (NEURONTIN) 100 MG capsule Take 1 capsule (100 mg total) by mouth 2 (two) times daily. Patient taking differently: Take 100 mg by mouth 3 (three) times daily.  03/12/19  Yes Larey Dresser, MD  insulin glargine (LANTUS) 100 UNIT/ML injection Inject 0.1 mLs (10 Units total) into the skin daily. Patient taking differently: Inject 5 Units into the skin daily.  05/09/18  Yes Lovenia Kim, MD  midodrine (PROAMATINE) 5 MG tablet Take 1 tablet (5 mg total) by mouth daily. Take 1 tablet before dialysis treatment 02/07/19  Yes Larey Dresser, MD  multivitamin (RENA-VIT) TABS tablet Take 1 tablet by mouth daily.   Yes [provider]  NOVOLOG 100 UNIT/ML injection INJECT 7 UNITS INTO THE SKIN DAILY WITH SUPPER. Patient taking differently: Inject 7 Units into the skin daily with supper.  03/15/19  Yes Anderson, Chelsey L, DO  oxyCODONE (ROXICODONE) 5 MG immediate release tablet Take 1-2 tablets every 4-6 hours for severe pain. 03/22/19  Yes Kroeger, Daleen Snook M., PA-C  promethazine (PHENERGAN) 12.5 MG tablet Take 1 tablet (12.5 mg total) by mouth every 6 (six) hours as needed for nausea or vomiting. 10/23/18  Yes Larey Dresser,  MD  warfarin (COUMADIN) 5 MG tablet TAKE AS FOLLOWS: MON-WED-FRI 1&1/2 TABLETS DAILY. ON ALL OTHER DAYS 1 TABLET Patient taking differently: Take 5-705 mg by mouth one time only at 6 PM. 7.5 mg  Take Tuesday and Thursday 5 mg all the other days 09/12/18  Yes Larey Dresser, MD  atorvastatin (LIPITOR) 40 MG tablet Take 1 tablet (40 mg total) by mouth daily at 6 PM. Patient not taking: Reported on 03/27/2019 01/25/18   [name], MD  calcitRIOL (ROCALTROL) 0.5 MCG capsule Take 1 capsule (0.5 mcg total) by mouth every Monday, Wednesday, and Friday. Patient not taking: Reported on 03/27/2019 06/06/18   Georgiana Shore, NP  citalopram (CELEXA) 20 MG tablet Take 1 tablet (20 mg total) by mouth daily. Patient not taking: Reported on 03/27/2019 03/19/18   Georgiana Shore, NP  dronabinol (MARINOL) 2.5 MG capsule Take 1 capsule (2.5 mg total) by mouth 2 (two) times daily before lunch and supper. Patient not taking: Reported on 03/27/2019 03/27/18   Larey Dresser, MD  erythromycin (E-MYCIN) 250 MG tablet Take 1 tablet (250 mg total) by mouth 3 (three) times daily. Patient not taking: Reported on 03/27/2019 03/25/18   Isaiah Serge, NP  LORazepam (ATIVAN) 0.5 MG tablet Take 1 tablet (0.5 mg total) by mouth every 12 (twelve) hours as needed (nausea). Patient not taking: Reported on 03/27/2019 03/27/18   Larey Dresser, MD  metoCLOPramide (REGLAN) 10 MG tablet Take 1 tablet (10 mg total) by  mouth 3 (three) times daily. Patient not taking: Reported on 03/27/2019 01/22/18   Georgiana Shore, NP  traMADol (ULTRAM) 50 MG tablet Take 1 tablet (50 mg total) by mouth every 6 (six) hours as needed for moderate pain. Patient not taking: Reported on 03/27/2019 08/18/18   Eileen Stanford, PA-C   Liver Function Tests Recent Labs  Lab 03/22/19 1341 03/27/19 1351  AST  --  28  ALT  --  9  ALKPHOS  --  105  BILITOT  --  0.7  PROT  --  8.3*  ALBUMIN 2.6* 3.1*   No results for input(s): LIPASE, AMYLASE in the last 168 hours. CBC Recent Labs  Lab 03/27/19 1351 03/27/19 2044 03/28/19 0318  WBC 17.1* 15.8* 18.6*  NEUTROABS 14.9*  --   --   HGB 9.9* 9.3* 8.4*  HCT 31.5* 29.3* 26.4*  MCV 94.9 94.2 94.6  PLT 322 307 544   Basic Metabolic Panel Recent Labs  Lab 03/22/19 1341 03/27/19 1351 03/28/19 0318  NA 133* 135 134*  K 3.8 4.0 4.7  CL 97* 95* 92*  CO2 22 23 22   GLUCOSE 166* 114* 164*  BUN 52* 14 25*  CREATININE 10.06* 4.66* 6.47*  CALCIUM 8.7* 9.8 9.2  PHOS 6.1*  --   --    Iron/TIBC/Ferritin/ %Sat    Component Value Date/Time   IRON 40 (L) 11/02/2017 0016   TIBC 188 (L) 11/02/2017 0016   FERRITIN 523 (H) 11/02/2017 0016   IRONPCTSAT 21 11/02/2017 0016    Vitals:   03/28/19 1100 03/28/19 1115 03/28/19 1130 03/28/19 1145  BP:  (!) 88/73 (!) 80/61 (!) 75/56  Pulse:    (!) 30  Resp:  17 14 18   Temp: 98.7 F (37.1 C)     TempSrc:      SpO2:    100%  Weight:        Exam Gen alert, no disterss No rash, cyanosis or gangrene Sclera anicteric, throat clear  No jvd or bruits Chest clear bilat to bases Cor mechanical heart sounds Abd soft ntnd no mass or ascites +bs GU normal male MS R foot w 4th toe amp, skin darkening in several areas of the foot Ext no leg or UE edema Neuro is alert, Ox 3 , nf RIJ TDC    Home meds:  - aspirin 81/ atorvastatin 40 qd  - metoclopramide 5 tid/ erythromycin 250 tid/ dronabinol 2.5 bid  - warfarin daily  - tramadol  qid prn/ citalopram 20 qd/ oxy IR  13m prn/ gabapentin 100 bid  - Novolog 7u hs/ insulin glargine 10u qd  - midodrine 5 mg pre HD tiw  - prn's/ vitamins/ supplements     Outpt HD: [facility] MWF  4h 124m   84kg  400/800   2K/3Ca bath  TDC   Hep none  - mircera 200uig last 7/22  - venofer 50/w  - hect 2 ug tiw     Assessment/ Plan: 1. ABd pain /nausea/ vomiting - h/o gastroparesis on mult meds.  Per primary 2. R foot ischemia - sp recent perc revasc last week and also 4th toe amp 3. ESRD : is on HD MWF.  Has not missed dialysis. HD Friday 4. Hypotension/ volume: on midodrine pre HD, BP's run low. No vol^^  Today 5. CHF/ severe ICM: s/p LVAD 2019 6. Chronic gastroparesis 7. DM2      RoKelly SplinterMD 03/28/2019, 1:03 PM

## 2019-03-28 NOTE — Progress Notes (Signed)
  Progress Note    03/28/2019 12:05 PM   Subjective: Not in any acute distress, just had pain medicine and is sleeping  Vitals:   03/28/19 1100 03/28/19 1115  BP:  (!) 88/73  Pulse:    Resp:  17  Temp: 98.7 F (37.1 C)   SpO2:      Physical Exam: Sleeping On the respirations Good signal at the posterior tibial at the ankle Right fourth toe amputation site is desiccated and there is some redness to his foot although does not appear overly warm to suggest that is infected  CBC    Component Value Date/Time   WBC 18.6 (H) 03/28/2019 0318   RBC 2.79 (L) 03/28/2019 0318   HGB 8.4 (L) 03/28/2019 0318   HGB 11.7 (L) 09/07/2016 1457   HCT 26.4 (L) 03/28/2019 0318   HCT 37.5 09/07/2016 1457   PLT 313 03/28/2019 0318   PLT 279 09/07/2016 1457   MCV 94.6 03/28/2019 0318   MCV 81 09/07/2016 1457   MCH 30.1 03/28/2019 0318   MCHC 31.8 03/28/2019 0318   RDW 15.5 03/28/2019 0318   RDW 14.9 09/07/2016 1457   LYMPHSABS 1.0 03/27/2019 1351   LYMPHSABS 1.3 09/07/2016 1457   MONOABS 0.9 03/27/2019 1351   EOSABS 0.1 03/27/2019 1351   EOSABS 0.2 09/07/2016 1457   BASOSABS 0.0 03/27/2019 1351   BASOSABS 0.0 09/07/2016 1457    BMET    Component Value Date/Time   NA 134 (L) 03/28/2019 0318   NA 144 05/11/2017 0938   K 4.7 03/28/2019 0318   CL 92 (L) 03/28/2019 0318   CO2 22 03/28/2019 0318   GLUCOSE 164 (H) 03/28/2019 0318   BUN 25 (H) 03/28/2019 0318   BUN 68 (H) 05/11/2017 0938   CREATININE 6.47 (H) 03/28/2019 0318   CREATININE 1.63 (H) 11/09/2016 1505   CALCIUM 9.2 03/28/2019 0318   CALCIUM 8.7 06/27/2017 1152   GFRNONAA 9 (L) 03/28/2019 0318   GFRNONAA 48 (L) 11/09/2016 1505   GFRAA 10 (L) 03/28/2019 0318   GFRAA 55 (L) 11/09/2016 1505    INR    Component Value Date/Time   INR 4.0 (H) 03/28/2019 0318     Intake/Output Summary (Last 24 hours) at 03/28/2019 1205 Last data filed at 03/28/2019 1100 Gross per 24 hour  Intake 1191.37 ml  Output -  Net 1191.37 ml      Assessment/plan:  53 y.o. male is s/p right lower extremity revascularization with stenting and balloon angioplasty posterior tibial artery via retrograde access and fourth toe amputation.  She really 50-50 whether this will heal or not and patient would require below-knee amputation.  We will continue to follow.   Tayshun Gappa C. Donzetta Matters, MD [facility] Office: [phone] Pager: [phone]  03/28/2019 12:05 PM

## 2019-03-28 NOTE — Progress Notes (Addendum)
[place] for warfarin Indication: LVAD  Allergies  Allergen Reactions  . Metformin And Related Diarrhea    Patient Measurements: Wt: 85.4 kg Ht: 5'7"  Vital Signs: Temp: 98.2 F (36.8 C) (07/23 0700) Temp Source: Oral (07/23 0700) BP: 72/55 (07/23 0650) Pulse Rate: 42 (07/23 0630)  Labs: Recent Labs    03/27/19 1351 03/27/19 2044 03/28/19 0318  HGB 9.9* 9.3* 8.4*  HCT 31.5* 29.3* 26.4*  PLT 322 307 313  LABPROT 36.9* 33.3* 38.4*  INR 3.8* 3.3* 4.0*  CREATININE 4.66*  --  6.47*    Estimated Creatinine Clearance: 13.5 mL/min (A) (by C-G formula based on SCr of 6.47 mg/dL (H)).   Medical History: Past Medical History:  Diagnosis Date  . Angina   . ASCVD (arteriosclerotic cardiovascular disease)    , Anterior infarction 2005, LAD diagonal bifurcation intervention 03/2004  . Automatic implantable cardiac defibrillator -St. Jude's       . Benign neoplasm of colon   . CHF (congestive heart failure) ([place])   . Chronic systolic heart failure ([place])   . Coronary artery disease     Widely patent previously placed stents in the left anterior   . Crohn's disease ([place])   . Deep venous thrombosis (HCC)    Recurrent-on Coumadin  . Dialysis patient ([place])   . Dyspnea   . Gastroparesis   . GERD (gastroesophageal reflux disease)   . High cholesterol   . Hyperlipidemia   . Hypersomnolent    Previous diagnosis of narcolepsy  . Hypertension, essential   . Ischemic cardiomyopathy    Ejection fraction 15-20% catheterization 2010  . Type II or unspecified type diabetes mellitus without mention of complication, not stated as uncontrolled   . Unspecified gastritis and gastroduodenitis without mention of hemorrhage     Medications:  Scheduled:  . aspirin EC  81 mg Oral Daily  . calcitRIOL  0.5 mcg Oral Q M,W,F  . Chlorhexidine Gluconate Cloth  6 each Topical Daily  . dronabinol  2.5 mg Oral BID AC  . fentaNYL (SUBLIMAZE) injection  50  mcg Intravenous Once  . gabapentin  100 mg Oral TID  . insulin aspart  7 Units Subcutaneous Q supper  . insulin glargine  5 Units Subcutaneous QHS  . multivitamin  1 tablet Oral QHS  . sodium chloride flush  10-40 mL Intracatheter Q12H    Assessment: 46 yom presenting with N/V and abdominal pain. On warfarin PTA for LVAD/Factor 5 Leiden. PTA regimen is 5 mg daily except 7.5 mg on Tuesday/Thursday. Last dose was on 7/20.  INR today is 4- despite being held for 3 days. Hgb 8.4, plt 313. LDH 270>181. No s/sx of bleeding outside of bleeding at central line (now s/p lidocaine/Epi injection).  Goal of Therapy:  INR 2-3 Monitor platelets by anticoagulation protocol: Yes   Plan:  Hold warfarin dosing tonight given supratherapeutic INR Monitor daily INR, CBC, and for s/sx of bleeding.   Antonietta Jewel, PharmD, [place] Clinical Pharmacist  Pager: [phone] Phone: [phone] 03/28/2019,7:50 AM

## 2019-03-29 DIAGNOSIS — N186 End stage renal disease: Secondary | ICD-10-CM

## 2019-03-29 DIAGNOSIS — D649 Anemia, unspecified: Secondary | ICD-10-CM

## 2019-03-29 LAB — BASIC METABOLIC PANEL
Anion gap: 11 (ref 5–15)
BUN: 43 mg/dL — ABNORMAL HIGH (ref 6–20)
CO2: 23 mmol/L (ref 22–32)
Calcium: 8.3 mg/dL — ABNORMAL LOW (ref 8.9–10.3)
Chloride: 99 mmol/L (ref 98–111)
Creatinine, Ser: 8.43 mg/dL — ABNORMAL HIGH (ref 0.61–1.24)
GFR calc Af Amer: 7 mL/min — ABNORMAL LOW (ref >60)
GFR calc non Af Amer: 6 mL/min — ABNORMAL LOW (ref >60)
Glucose, Bld: 195 mg/dL — ABNORMAL HIGH (ref 70–99)
Potassium: 4.2 mmol/L (ref 3.5–5.1)
Sodium: 133 mmol/L — ABNORMAL LOW (ref 135–145)

## 2019-03-29 LAB — LACTATE DEHYDROGENASE: LDH: 159 U/L (ref 98–192)

## 2019-03-29 LAB — GLUCOSE, CAPILLARY
Glucose-Capillary: 149 mg/dL — ABNORMAL HIGH (ref 70–99)
Glucose-Capillary: 141 mg/dL — ABNORMAL HIGH (ref 70–99)
Glucose-Capillary: 246 mg/dL — ABNORMAL HIGH (ref 70–99)
Glucose-Capillary: 128 mg/dL — ABNORMAL HIGH (ref 70–99)

## 2019-03-29 LAB — CBC
HCT: 22 % — ABNORMAL LOW (ref 39.0–52.0)
Hemoglobin: 7.2 g/dL — ABNORMAL LOW (ref 13.0–17.0)
MCH: 30.3 pg (ref 26.0–34.0)
MCHC: 32.7 g/dL (ref 30.0–36.0)
MCV: 92.4 fL (ref 80.0–100.0)
Platelets: 259 10*3/uL (ref 150–400)
RBC: 2.38 MIL/uL — ABNORMAL LOW (ref 4.22–5.81)
RDW: 15.3 % (ref 11.5–15.5)
WBC: 18.9 10*3/uL — ABNORMAL HIGH (ref 4.0–10.5)
nRBC: 1.3 % — ABNORMAL HIGH (ref 0.0–0.2)

## 2019-03-29 LAB — PREPARE RBC (CROSSMATCH)

## 2019-03-29 LAB — PROTIME-INR
INR: 4.4 (ref 0.8–1.2)
Prothrombin Time: 41.2 seconds — ABNORMAL HIGH (ref 11.4–15.2)

## 2019-03-29 MED ORDER — MIDODRINE HCL 5 MG PO TABS
5.0000 mg | ORAL_TABLET | ORAL | Status: DC
  Administered 2019-03-29: 5 mg via ORAL
  Filled 2019-03-29: qty 1

## 2019-03-29 MED ORDER — ENSURE ENLIVE PO LIQD
237.0000 mL | Freq: Three times a day (TID) | ORAL | Status: DC
  Administered 2019-03-29 – 2019-04-04 (×13): 237 mL via ORAL

## 2019-03-29 MED ORDER — SODIUM CHLORIDE 0.9% IV SOLUTION
Freq: Once | INTRAVENOUS | Status: AC
  Administered 2019-03-29: 11:00:00 via INTRAVENOUS

## 2019-03-29 MED ORDER — ENSURE ENLIVE PO LIQD
237.0000 mL | Freq: Two times a day (BID) | ORAL | Status: AC
  Administered 2019-03-29 (×2): 237 mL via ORAL

## 2019-03-29 MED ORDER — MIDODRINE HCL 5 MG PO TABS
5.0000 mg | ORAL_TABLET | Freq: Three times a day (TID) | ORAL | Status: DC
  Administered 2019-03-29 – 2019-03-30 (×2): 5 mg via ORAL
  Filled 2019-03-29 (×2): qty 1

## 2019-03-29 NOTE — Progress Notes (Signed)
Initial Nutrition Assessment  DOCUMENTATION CODES:   Not applicable  INTERVENTION:    Ensure Enlive po TID, each supplement provides 350 kcal and 20 grams of protein  Renal MVI  NUTRITION DIAGNOSIS:   Inadequate oral intake related to nausea, vomiting as evidenced by per patient/family report.  GOAL:   Patient will meet greater than or equal to 90% of their needs  MONITOR:   PO intake, Supplement acceptance, Labs, I & O's, Skin, Weight trends  REASON FOR ASSESSMENT:   Rounds    ASSESSMENT:   Patient with PMH significant for CAD s/p MI, NSTEMI with stenting, gastroparesis, ischemic cardiomyopathy s/p LVAD, ESRD on HD, factor V Leiden, PAF, and R ischemic foot. Presents this admission with nausea/vomiting and R foot pain.   Spoke with pt at bedside. Reports having poor appetite over the last week due to ongoing nausea/vomiting. Prior to this he was eating two meals (lunch/dinner) that consisted of chicken and pork. He typically skips breakfast due to HD schedule. He does not use supplementation. Pt reports feeling full (likely due to gastroparesis) often. RD observed lunch tray at bedside untouched. He was drinking Ensure. Meal completions charted as 10-25% for pt's last two meals. Discussed the importance of protein intake for preservation of lean body mass. Pt willing to continue Ensure.   Pt recommended to take five? Phosphorus binders with each meal but admits he is inconsistent. He does not make urine.   Pt reports an EDW of 84 kg and denies any recent wt loss. NFPE done. Severe muscle depletion noted on BLE likely due to immobility as pt reports he doesn't walk much.   Admission weight: 93 kg Current weight 84.6 kg   I/O: +3,400 ml since admit  Medications: calcitriol, marinol 2.5 mg BID, SS novolog, lantus, reglan TID, rena-vit Labs: Na 133 (L) CBG 141-231   Diet Order:   Diet Order            Diet heart healthy/carb modified Room service appropriate? Yes;  Fluid consistency: Thin  Diet effective now              EDUCATION NEEDS:   Education needs have been addressed  Skin:  Skin Assessment: Skin Integrity Issues: Skin Integrity Issues:: Incisions Incisions: s/p R toe amputation  Last BM:  PTA  Height:   Ht Readings from Last 1 Encounters:  03/28/19 5' 7.01" (1.702 m)    Weight:   Wt Readings from Last 1 Encounters:  03/29/19 84.6 kg    Ideal Body Weight:  67.3 kg  BMI:  Body mass index is 29.2 kg/m.  Estimated Nutritional Needs:   Kcal:  2200-2400 kcal  Protein:  110-130 grams  Fluid:  1000 ml + UOP   Mariana Single RD, LDN Clinical Nutrition Pager # [phone]

## 2019-03-29 NOTE — Progress Notes (Signed)
Pt receiving dialysis, per dialysis nurse,not removing any fluid. This RN found doppler pressure to be 58 twice. Pt drowsy at this time but easy to arouse and mentating. Flows 3.1-3.4, PI 2-8. PI events noted. Vasopressin restarted.   Re-checked doppler MAP after interventions, now 80.  RN will continue to monitor.

## 2019-03-29 NOTE — Progress Notes (Signed)
  Progress Note    03/29/2019 8:10 AM * No surgery found *  Subjective: Still having nausea  Vitals:   03/29/19 0741 03/29/19 0800  BP:  105/80  Pulse:    Resp:    Temp: 98.7 F (37.1 C)   SpO2:      Physical Exam: Awake alert oriented Right posterior tibial signal is strong Right foot is warm to the midfoot, right fourth toe amputation site does not appear infected and is intact.  Does have dusky discoloration of other toes on the right foot as well.  CBC    Component Value Date/Time   WBC 18.9 (H) 03/29/2019 0337   RBC 2.38 (L) 03/29/2019 0337   HGB 7.2 (L) 03/29/2019 0337   HGB 11.7 (L) 09/07/2016 1457   HCT 22.0 (L) 03/29/2019 0337   HCT 37.5 09/07/2016 1457   PLT 259 03/29/2019 0337   PLT 279 09/07/2016 1457   MCV 92.4 03/29/2019 0337   MCV 81 09/07/2016 1457   MCH 30.3 03/29/2019 0337   MCHC 32.7 03/29/2019 0337   RDW 15.3 03/29/2019 0337   RDW 14.9 09/07/2016 1457   LYMPHSABS 1.0 03/27/2019 1351   LYMPHSABS 1.3 09/07/2016 1457   MONOABS 0.9 03/27/2019 1351   EOSABS 0.1 03/27/2019 1351   EOSABS 0.2 09/07/2016 1457   BASOSABS 0.0 03/27/2019 1351   BASOSABS 0.0 09/07/2016 1457    BMET    Component Value Date/Time   NA 133 (L) 03/29/2019 0337   NA 144 05/11/2017 0938   K 4.2 03/29/2019 0337   CL 99 03/29/2019 0337   CO2 23 03/29/2019 0337   GLUCOSE 195 (H) 03/29/2019 0337   BUN 43 (H) 03/29/2019 0337   BUN 68 (H) 05/11/2017 0938   CREATININE 8.43 (H) 03/29/2019 0337   CREATININE 1.63 (H) 11/09/2016 1505   CALCIUM 8.3 (L) 03/29/2019 0337   CALCIUM 8.7 06/27/2017 1152   GFRNONAA 6 (L) 03/29/2019 0337   GFRNONAA 48 (L) 11/09/2016 1505   GFRAA 7 (L) 03/29/2019 0337   GFRAA 55 (L) 11/09/2016 1505    INR    Component Value Date/Time   INR 4.4 (HH) 03/29/2019 0337     Intake/Output Summary (Last 24 hours) at 03/29/2019 0810 Last data filed at 03/29/2019 0700 Gross per 24 hour  Intake 2439.38 ml  Output -  Net 2439.38 ml      Assessment/plan:  53 y.o. male is s/p right lower extremity revascularization and right fourth toe amputation.  He is admitted with persistent nausea somewhat improving.  Does have leukocytosis tunnel dialysis catheter does not appear infected.  Amputation site does not appear infected.  He remains high risk for proximal right lower extremity amputation.  At some point we will likely need to address his left lower extremity as well given his extensive tibial disease there.  If there are issues over the weekend please contact vascular surgeon on call otherwise I will see him again Monday    Demetri Goshert C. Donzetta Matters, MD [facility] Office: [phone] Pager: [phone]  03/29/2019 8:10 AM

## 2019-03-29 NOTE — Progress Notes (Signed)
[facility] Progress Note  Subjective: no new c/o today  Vitals:   03/29/19 1045 03/29/19 1050 03/29/19 1100 03/29/19 1115  BP:   (!) 119/95   Pulse: 78 79 80 80  Resp: 20 19 19 16   Temp:  98.4 F (36.9 C)    TempSrc:  Oral    SpO2: 96% 97% 98% 99%  Weight:      Height:        Inpatient medications: . aspirin EC  81 mg Oral Daily  . calcitRIOL  0.5 mcg Oral Q M,W,F  . Chlorhexidine Gluconate Cloth  6 each Topical Daily  . Chlorhexidine Gluconate Cloth  6 each Topical Q0600  . dronabinol  2.5 mg Oral BID AC  . erythromycin  250 mg Oral TID AC  . feeding supplement (ENSURE ENLIVE)  237 mL Oral BID BM  . gabapentin  100 mg Oral TID  . insulin aspart  0-15 Units Subcutaneous TID WC  . insulin aspart  0-5 Units Subcutaneous QHS  . insulin glargine  5 Units Subcutaneous QHS  . metoCLOPramide  10 mg Oral TID AC  . midodrine  5 mg Oral Q M,W,F-HD  . multivitamin  1 tablet Oral QHS  . sodium chloride flush  10-40 mL Intracatheter Q12H   . ceFEPime (MAXIPIME) IV Stopped (03/28/19 1214)  . vancomycin    . vasopressin (PITRESSIN) infusion - *FOR SHOCK* Stopped (03/29/19 0703)   acetaminophen, docusate sodium, fentaNYL (SUBLIMAZE) injection, hydrALAZINE, LORazepam, ondansetron (ZOFRAN) IV, oxyCODONE, promethazine **OR** promethazine **OR** promethazine, sodium chloride flush, traMADol    Exam: Gen alert, no disterss No jvd or bruits Chest clear bilat to bases Cor mechanical heart sounds Abd soft ntnd no mass or ascites +bs, hardware GU normal male MS R foot w 4th toe amp, skin darkening in several areas of the foot Neuro is alert, Ox 3 , nf RIJ TDC    Home meds:  - aspirin 81/ atorvastatin 40 qd  - metoclopramide 5 tid/ erythromycin 250 tid/ dronabinol 2.5 bid  - warfarin daily  - tramadol qid prn/ citalopram 20 qd/ oxy IR  56m prn/ gabapentin 100 bid  - Novolog 7u hs/ insulin glargine 10u qd  - midodrine 5 mg pre HD tiw  - prn's/ vitamins/  supplements    Outpt HD: [facility] MWF  4h 192m   84kg  400/800   2K/3Ca bath  TDC   Hep none  - mircera 200uig last 7/22  - venofer 50/w  - hect 2 ug tiw   Assessment/ Plan: 1. Abd pain /nausea/ vomiting - h/o gastroparesis on mult meds 2. R foot ischemia - sp perc revasc last week+ 4th toe amp. F/b VVS.   3. Anemia ABL/ CKD: sp 1u prbc overnight. TDC not bleeding this am. Cont to monitor 4. ESRD : is on HD MWF.  HD today.  5. Hypotension/ volume: on midodrine pre HD, BP's run low. No vol^^ 6. CHF/ severe ICM: s/p LVAD 2019 7. Chronic gastroparesis 8. DM2    [facility] 03/29/2019, 11:27 AM  Iron/TIBC/Ferritin/ %Sat    Component Value Date/Time   IRON 40 (L) 11/02/2017 0016   TIBC 188 (L) 11/02/2017 0016   FERRITIN 523 (H) 11/02/2017 0016   IRONPCTSAT 21 11/02/2017 0016   Recent Labs  Lab 03/22/19 1341 03/27/19 1351  03/29/19 0337  NA 133* 135   < > 133*  K 3.8 4.0   < > 4.2  CL 97* 95*   < >  99  CO2 22 23   < > 23  GLUCOSE 166* 114*   < > 195*  BUN 52* 14   < > 43*  CREATININE 10.06* 4.66*   < > 8.43*  CALCIUM 8.7* 9.8   < > 8.3*  PHOS 6.1*  --   --   --   ALBUMIN 2.6* 3.1*  --   --   INR  --  3.8*   < > 4.4*   < > = values in this interval not displayed.   Recent Labs  Lab 03/27/19 1351  AST 28  ALT 9  ALKPHOS 105  BILITOT 0.7  PROT 8.3*   Recent Labs  Lab 03/29/19 0337  WBC 18.9*  HGB 7.2*  HCT 22.0*  PLT 259

## 2019-03-29 NOTE — Progress Notes (Signed)
[name] for warfarin Indication: LVAD  Allergies  Allergen Reactions  . Metformin And Related Diarrhea    Patient Measurements: Wt: 85.4 kg Ht: 5'7"  Vital Signs: Temp: 99.3 F (37.4 C) (07/24 0400) Temp Source: Oral (07/24 0400) BP: 121/99 (07/24 0700) Pulse Rate: 71 (07/24 0700)  Labs: Recent Labs    03/27/19 1351 03/27/19 2044 03/28/19 0318  03/28/19 1435 03/28/19 2001 03/29/19 0337  HGB 9.9* 9.3* 8.4*   < > 6.5* 7.3* 7.2*  HCT 31.5* 29.3* 26.4*   < > 20.5* 22.3* 22.0*  [name] 322 307 313   < > 293 244 259  LABPROT 36.9* 33.3* 38.4*  --   --   --  41.2*  INR 3.8* 3.3* 4.0*  --   --   --  4.4*  CREATININE 4.66*  --  6.47*  --   --   --  8.43*   < > = values in this interval not displayed.    Estimated Creatinine Clearance: 10.4 mL/min (A) (by C-G formula based on SCr of 8.43 mg/dL (H)).   Medical History: Past Medical History:  Diagnosis Date  . Angina   . ASCVD (arteriosclerotic cardiovascular disease)    , Anterior infarction 2005, LAD diagonal bifurcation intervention 03/2004  . Automatic implantable cardiac defibrillator -St. Jude's       . Benign neoplasm of colon   . CHF (congestive heart failure) ([place])   . Chronic systolic heart failure ([place])   . Coronary artery disease     Widely patent previously placed stents in the left anterior   . Crohn's disease ([place])   . Deep venous thrombosis (HCC)    Recurrent-on Coumadin  . Dialysis patient ([place])   . Dyspnea   . Gastroparesis   . GERD (gastroesophageal reflux disease)   . High cholesterol   . Hyperlipidemia   . Hypersomnolent    Previous diagnosis of narcolepsy  . Hypertension, essential   . Ischemic cardiomyopathy    Ejection fraction 15-20% catheterization 2010  . Type II or unspecified type diabetes mellitus without mention of complication, not stated as uncontrolled   . Unspecified gastritis and gastroduodenitis without mention of hemorrhage      Medications:  Scheduled:  . aspirin EC  81 mg Oral Daily  . calcitRIOL  0.5 mcg Oral Q M,W,F  . Chlorhexidine Gluconate Cloth  6 each Topical Daily  . Chlorhexidine Gluconate Cloth  6 each Topical Q0600  . dronabinol  2.5 mg Oral BID AC  . erythromycin  250 mg Oral TID AC  . gabapentin  100 mg Oral TID  . insulin aspart  0-15 Units Subcutaneous TID WC  . insulin aspart  0-5 Units Subcutaneous QHS  . insulin glargine  5 Units Subcutaneous QHS  . metoCLOPramide  10 mg Oral TID AC  . multivitamin  1 tablet Oral QHS  . sodium chloride flush  10-40 mL Intracatheter Q12H    Assessment: [name], [name] at CVC stabilized.   Goal of Therapy:  INR 2-3 Monitor platelets by anticoagulation protocol: Yes   Plan:  Hold warfarin dosing tonight given supratherapeutic INR - will give dose  of Ensure to help trend INR downward Monitor daily INR, CBC, and for s/sx of [name].   Antonietta Jewel, PharmD, [place] Clinical Pharmacist  Pager: [phone] Phone: [phone] 03/29/2019,7:10 AM

## 2019-03-29 NOTE — Progress Notes (Signed)
Doppler 50

## 2019-03-29 NOTE — Progress Notes (Addendum)
Advanced Heart Failure VAD Team Note  PCP-Cardiologist: No primary care provider on file.   Subjective:   Admitted with intractable N/V and R foot pain.   Remain on vasopressin. WBC 18.9   Remains nauseated. Weak. Bleeding from [facility] cath has stopped. No fevers or chills. Continues to ask for pain meds and anti-emetics.    LVAD INTERROGATION:  HeartMate 3 LVAD:   Flow 3.7 liters/min, speed 5500 , power 4 , PI 4.9  Objective:    Vital Signs:   Temp:  [98.5 F (36.9 C)-99.3 F (37.4 C)] 98.7 F (37.1 C) (07/24 0741) Pulse Rate:  [25-225] 71 (07/24 0700) Resp:  [7-26] 26 (07/24 0700) BP: (67-137)/(44-103) 105/80 (07/24 0800) SpO2:  [88 %-100 %] 100 % (07/24 0700) Weight:  [83 kg-84.5 kg] 84.5 kg (07/24 0500)   Mean arterial Pressure 90-100 Intake/Output:   Intake/Output Summary (Last 24 hours) at 03/29/2019 0855 Last data filed at 03/29/2019 0700 Gross per 24 hour  Intake 2439.38 ml  Output --  Net 2439.38 ml     Physical Exam   Physical Exam: GENERAL: Appears chronically ill.  HEENT: normal  NECK: Supple, JVP 7-8  .  2+ bilaterally, no bruits.  No lymphadenopathy or thyromegaly appreciated.  RIJ HD  CARDIAC:  Mechanical heart sounds with LVAD hum present.  LUNGS:  Clear to auscultation bilaterally.  ABDOMEN:  Soft, round, nontender, positive bowel sounds x4.     LVAD exit site:   Dressing dry and intact.  No erythema or drainage.  Stabilization device present and accurately applied.  Driveline dressing is being changed daily per sterile technique. EXTREMITIES:  Warm and dry, no cyanosis, clubbing, rash or edema. R foot great toe, 2nd toe, 3rd, 5th toe tip black. Fore foot erythema.  NEUROLOGIC:  Alert and oriented x 4.  Gait steady.  No aphasia.  No dysarthria.  Affect pleasant.      Telemetry  NSR 70s with PVCs. Personally reviewed   EKG    N/a   Labs   Basic Metabolic Panel: Recent Labs  Lab 03/22/19 1341 03/27/19 1351 03/28/19 0318 03/29/19 0337   NA 133* 135 134* 133*  K 3.8 4.0 4.7 4.2  CL 97* 95* 92* 99  CO2 22 23 22 23   GLUCOSE 166* 114* 164* 195*  BUN 52* 14 25* 43*  CREATININE 10.06* 4.66* 6.47* 8.43*  CALCIUM 8.7* 9.8 9.2 8.3*  PHOS 6.1*  --   --   --     Liver Function Tests: Recent Labs  Lab 03/22/19 1341 03/27/19 1351  AST  --  28  ALT  --  9  ALKPHOS  --  105  BILITOT  --  0.7  PROT  --  8.3*  ALBUMIN 2.6* 3.1*   No results for input(s): LIPASE, AMYLASE in the last 168 hours. No results for input(s): AMMONIA in the last 168 hours.  CBC: Recent Labs  Lab 03/27/19 1351  03/28/19 0318 03/28/19 1358 03/28/19 1435 03/28/19 2001 03/29/19 0337  WBC 17.1*   < > 18.6* 19.9* 19.2* 18.4* 18.9*  NEUTROABS 14.9*  --   --   --   --   --   --   HGB 9.9*   < > 8.4* 6.6* 6.5* 7.3* 7.2*  HCT 31.5*   < > 26.4* 21.2* 20.5* 22.3* 22.0*  MCV 94.9   < > 94.6 95.5 94.9 93.7 92.4  PLT 322   < > 313 302 293 244 259   < > = values  in this interval not displayed.    INR: Recent Labs  Lab 03/27/19 1351 03/27/19 2044 03/28/19 0318 03/29/19 0337  INR 3.8* 3.3* 4.0* 4.4*    Other results:  EKG:    Imaging   Dg Chest Port 1 View  Result Date: 03/27/2019 CLINICAL DATA:  Central line placement, history hypertension, diabetes mellitus, CHF, coronary artery disease, LVAD EXAM: PORTABLE CHEST 1 VIEW COMPARISON:  Portable exam 1839 hours compared to 03/15/2019 FINDINGS: LVAD projects over cardiac apex. Multiple coronary stents noted. RIGHT jugular central venous catheter with tip projecting over cavoatrial junction. LEFT jugular dual-lumen central venous catheter with tip projecting over cavoatrial junction. LEFT subclavian AICD with leads projecting over RIGHT atrium and RIGHT ventricle. Enlargement of cardiac silhouette. Mediastinal contours and pulmonary vascularity normal. Decreased lung volumes with minimal bibasilar atelectasis. No infiltrate, pleural effusion or pneumothorax. IMPRESSION: Bibasilar atelectasis. No  pneumothorax. Electronically Signed   By: Lavonia Dana M.D.   On: 03/27/2019 19:07     Medications:     Scheduled Medications:  sodium chloride   Intravenous Once   aspirin EC  81 mg Oral Daily   calcitRIOL  0.5 mcg Oral Q M,W,F   Chlorhexidine Gluconate Cloth  6 each Topical Daily   Chlorhexidine Gluconate Cloth  6 each Topical Q0600   dronabinol  2.5 mg Oral BID AC   erythromycin  250 mg Oral TID AC   gabapentin  100 mg Oral TID   insulin aspart  0-15 Units Subcutaneous TID WC   insulin aspart  0-5 Units Subcutaneous QHS   insulin glargine  5 Units Subcutaneous QHS   metoCLOPramide  10 mg Oral TID AC   multivitamin  1 tablet Oral QHS   sodium chloride flush  10-40 mL Intracatheter Q12H    Infusions:  ceFEPime (MAXIPIME) IV Stopped (03/28/19 1214)   vancomycin     vasopressin (PITRESSIN) infusion - *FOR SHOCK* 0.03 Units/min (03/29/19 0700)    PRN Medications: acetaminophen, docusate sodium, fentaNYL (SUBLIMAZE) injection, hydrALAZINE, LORazepam, ondansetron (ZOFRAN) IV, oxyCODONE, promethazine **OR** promethazine **OR** promethazine, sodium chloride flush, traMADol    Assessment/Plan:    1. Nausea/Vomiting Known gastroparesis.  -Reglan restarted.  Continue phenergan and ativan.    2. R Foot Pain/PAD S/P 4th toe amputation with recent stent to SFA and PTSCA of PT.  R Foot warm able to doppler pedal pulse but great toes,2nd, 3rd 5th toe tips black with some concern for ischemic changes extending to forefoot.  VVS appreciated. No change.   3. Chronic Heart Failure with HMIII VAD From HF perspective stable.  - Hold midodrine for now until he is able to take pos.  -LDH stable. INR 4.4  - Pharmacy consulted coumadin. Holding coumadin until INR comes down.    4. ESRD Had HD 7/22.   Consult renal while hospitalized. Dr Jonnie Finner aware.   5. ID  WBC 17>18.6 >18.9   Blood CX x2- NGTD  6. Anemia  Hgb trending down from 9.3> 8.4>6.5>7.2. No  additional oozing.  Received 1UPRBC 7/23. Give 1UPRBC today.     7. Hypotension Resolved.   I reviewed the LVAD parameters from today, and compared the results to the patient's prior recorded data.  No programming changes were made.  The LVAD is functioning within specified parameters.  The patient performs LVAD self-test daily.  LVAD interrogation was + 3 low flows. LVAD equipment check completed and is in good working order.  Back-up equipment present.   LVAD education done on emergency procedures and precautions  and reviewed exit site care.  Length of Stay: 2  Darrick Grinder, NP 03/29/2019, 8:55 AM  VAD Team --- VAD ISSUES ONLY--- Pager [phone] (7am - 7am)  Advanced Heart Failure Team  Pager [phone] (M-F; 7a - 4p)  Please contact [facility] for night-coverage after hours (4p -7a ) and weekends on [email or web address]  Patient seen and examined with the above-signed Advanced Practice Provider and/or Housestaff. I personally reviewed laboratory data, imaging studies and relevant notes. I independently examined the patient and formulated the important aspects of the plan. I have edited the note to reflect any of my changes or salient points. I have personally discussed the plan with the patient and/or family.  He remains quite weak and ill. WBC still high. R foot discolored and appears ischemic but d/w with VVS and they don't feel like it is acutely infected but feels he remains at high-risk for amputation. Volume status ok. Remains with severe nausea. Not taking pos in setting of severe gastroparesis. Now appears addicted to pain meds and anti-emetics. We have tried to stretch out dosing interval as this is only making gastroparesis worse.   For HD today. Will give 1u RBCs with HD. Continue cefipime for possible foot infection. VAD interrogated personally. Parameters stable.  [name], MD  10:22 AM

## 2019-03-29 NOTE — Progress Notes (Signed)
LVAD Coordinator Rounding Note:  Pt admitted per Dr. Haroldine Laws 03/27/19 for nausea, vomiting, and right foot pain.  HM III LVAD implanted on 06/12/17 by Dr. Prescott Gum under Destination Therapy criteria due to renal insufficiency. Pump exchange on 03/01/18 for outflow graft twist/obstruction.   Patient laying in bed asleep. Per bedside RN he has had severe nausea this morning.  Vital signs: Temp: 98.7  HR:  90 Doppler Pressure: 108 Auto BP: 105/80 (90) O2 Sat: 100 % on RA Wt: 182.9>186.2 lbs  LVAD interrogation reveals:  Speed: 5500 Flow: 3.9 Power: 4.6w PI: 4.2 Alarms: none Events: 2 PI events   Hematocrit: 29  Fixed speed: 5500 Low speed limit: 5200  Drive Line:  CDI. Weekly dressing kits.  Next dressing change due 04/03/19; bedside nurse may change dressing.   Labs: LDH trend: 181>159  INR trend: 4.0>4.4  WBC trend: 18.6>18.9  Infection: blood cx pending   Gtts: - Vasopressin >> started 03/28/19>>off   Anticoagulation Plan: - INR Goal: 2.0 - 3.0 - ASA Dose: 81 mg daily   Device: -St Jude dual  -Therapies: on 200 bpm - Monitor: on VT 150 bpm  Adverse Events on VAD: - ESRD post LVAD; started CVVH 06/15/17; HD started 06/20/17 - Hospitalization 11/5 - 07/16/17 > gastroparesis diagnosis after EGD - Hospitalization 11/26 - 08/04/17 > gastroparesis - Hospitalization 1/8 - 09/16/17>gastroparesis - referred to Dr. Corliss Parish at [facility] - 10/31/17>rightupper arm arteriovenous graft with Artegraft; ligation of right first stage brachial vein transposition  - 12/11/13 elective admission for thrombectomy for AV graft in RUA  - 12/20/17 > intractable N/V - 01/19/18 > intractable N/V sent by EMS from dialysis center - 03/01/18 > outflow graft twist/occulsion requiring pump exchange - 05/23/18 > admitted for non-working fistula in RUA - 06/10/18 > admitted for intractable N/V - 08/08/18 > admitted for peri-op management for 2nd stage of AV fistula, found to be sclerotic. New LUE  graft created from brachial artery to axillary vein. Developed spontaneous LUE hematoma.  - 08/22/18 > admitted for intractable N/V - 08/28/18> admitted for intractable N/V/D and low INR  Plan/Recommendations:  1. Please page VAD coordinator with any equipment questions or concerns. 2. Weekly VAD dressing changes - due 04/03/19 bedside nurse to perform.   [name] RN [place] Coordinator  Office: [phone]  24/7 Pager: [phone]

## 2019-03-30 ENCOUNTER — Inpatient Hospital Stay (HOSPITAL_COMMUNITY): Payer: BC Managed Care – PPO

## 2019-03-30 DIAGNOSIS — I96 Gangrene, not elsewhere classified: Secondary | ICD-10-CM

## 2019-03-30 LAB — BPAM RBC
Blood Product Expiration Date: 202008202359
Blood Product Expiration Date: 202008202359
ISSUE DATE / TIME: 202007231539
ISSUE DATE / TIME: 202007241021
Unit Type and Rh: 5100
Unit Type and Rh: 5100

## 2019-03-30 LAB — CBC
HCT: 26.4 % — ABNORMAL LOW (ref 39.0–52.0)
Hemoglobin: 8.5 g/dL — ABNORMAL LOW (ref 13.0–17.0)
MCH: 30.1 pg (ref 26.0–34.0)
MCHC: 32.2 g/dL (ref 30.0–36.0)
MCV: 93.6 fL (ref 80.0–100.0)
Platelets: 267 10*3/uL (ref 150–400)
RBC: 2.82 MIL/uL — ABNORMAL LOW (ref 4.22–5.81)
RDW: 15.1 % (ref 11.5–15.5)
WBC: 20 10*3/uL — ABNORMAL HIGH (ref 4.0–10.5)
nRBC: 2.9 % — ABNORMAL HIGH (ref 0.0–0.2)

## 2019-03-30 LAB — TYPE AND SCREEN
ABO/RH(D): O POS
Antibody Screen: NEGATIVE
Unit division: 0
Unit division: 0

## 2019-03-30 LAB — BASIC METABOLIC PANEL
Anion gap: 11 (ref 5–15)
BUN: 26 mg/dL — ABNORMAL HIGH (ref 6–20)
CO2: 26 mmol/L (ref 22–32)
Calcium: 8.3 mg/dL — ABNORMAL LOW (ref 8.9–10.3)
Chloride: 96 mmol/L — ABNORMAL LOW (ref 98–111)
Creatinine, Ser: 5.73 mg/dL — ABNORMAL HIGH (ref 0.61–1.24)
GFR calc Af Amer: 12 mL/min — ABNORMAL LOW (ref >60)
GFR calc non Af Amer: 10 mL/min — ABNORMAL LOW (ref >60)
Glucose, Bld: 226 mg/dL — ABNORMAL HIGH (ref 70–99)
Potassium: 3.7 mmol/L (ref 3.5–5.1)
Sodium: 133 mmol/L — ABNORMAL LOW (ref 135–145)

## 2019-03-30 LAB — SEDIMENTATION RATE: Sed Rate: 70 mm/hr — ABNORMAL HIGH (ref 0–16)

## 2019-03-30 LAB — PROTIME-INR
INR: 2.8 — ABNORMAL HIGH (ref 0.8–1.2)
Prothrombin Time: 29.4 seconds — ABNORMAL HIGH (ref 11.4–15.2)

## 2019-03-30 LAB — GLUCOSE, CAPILLARY
Glucose-Capillary: 124 mg/dL — ABNORMAL HIGH (ref 70–99)
Glucose-Capillary: 165 mg/dL — ABNORMAL HIGH (ref 70–99)
Glucose-Capillary: 143 mg/dL — ABNORMAL HIGH (ref 70–99)
Glucose-Capillary: 202 mg/dL — ABNORMAL HIGH (ref 70–99)

## 2019-03-30 LAB — LACTATE DEHYDROGENASE: LDH: 203 U/L — ABNORMAL HIGH (ref 98–192)

## 2019-03-30 MED ORDER — WARFARIN SODIUM 1 MG PO TABS
1.0000 mg | ORAL_TABLET | Freq: Once | ORAL | Status: AC
  Administered 2019-03-30: 1 mg via ORAL
  Filled 2019-03-30: qty 1

## 2019-03-30 MED ORDER — WARFARIN SODIUM 2 MG PO TABS: 2.0000 mg | ORAL_TABLET | Freq: Once | ORAL | Status: DC

## 2019-03-30 MED ORDER — SORBITOL 70 % PO SOLN
30.0000 mL | Freq: Once | ORAL | Status: AC
  Administered 2019-03-30: 21:00:00 30 mL via ORAL
  Filled 2019-03-30: qty 30

## 2019-03-30 MED ORDER — MIDODRINE HCL 5 MG PO TABS
5.0000 mg | ORAL_TABLET | ORAL | Status: DC
  Administered 2019-04-01 – 2019-04-03 (×5): 5 mg via ORAL
  Filled 2019-03-30 (×4): qty 1

## 2019-03-30 MED ORDER — WARFARIN - PHARMACIST DOSING INPATIENT
Freq: Every day | Status: DC
  Administered 2019-03-30 – 2019-04-03 (×2): 1

## 2019-03-30 NOTE — Progress Notes (Signed)
[place] for warfarin Indication: LVAD  Allergies  Allergen Reactions  . Metformin And Related Diarrhea    Patient Measurements: Wt: 85.4 kg Ht: 5'7"  Vital Signs: Temp: 98.6 F (37 C) (07/25 0724) Temp Source: Oral (07/25 0724) BP: 85/69 (07/25 0600) Pulse Rate: 61 (07/25 0600)  Labs: Recent Labs    03/28/19 0318  03/28/19 2001 03/29/19 0337 03/30/19 0316  HGB 8.4*   < > 7.3* 7.2* 8.5*  HCT 26.4*   < > 22.3* 22.0* 26.4*  PLT 313   < > 244 259 267  LABPROT 38.4*  --   --  41.2* 29.4*  INR 4.0*  --   --  4.4* 2.8*  CREATININE 6.47*  --   --  8.43* 5.73*   < > = values in this interval not displayed.    Estimated Creatinine Clearance: 15.4 mL/min (A) (by C-G formula based on SCr of 5.73 mg/dL (H)).   Medical History: Past Medical History:  Diagnosis Date  . Angina   . ASCVD (arteriosclerotic cardiovascular disease)    , Anterior infarction 2005, LAD diagonal bifurcation intervention 03/2004  . Automatic implantable cardiac defibrillator -St. Jude's       . Benign neoplasm of colon   . CHF (congestive heart failure) ([place])   . Chronic systolic heart failure ([place])   . Coronary artery disease     Widely patent previously placed stents in the left anterior   . Crohn's disease ([place])   . Deep venous thrombosis (HCC)    Recurrent-on Coumadin  . Dialysis patient ([place])   . Dyspnea   . Gastroparesis   . GERD (gastroesophageal reflux disease)   . High cholesterol   . Hyperlipidemia   . Hypersomnolent    Previous diagnosis of narcolepsy  . Hypertension, essential   . Ischemic cardiomyopathy    Ejection fraction 15-20% catheterization 2010  . Type II or unspecified type diabetes mellitus without mention of complication, not stated as uncontrolled   . Unspecified gastritis and gastroduodenitis without mention of hemorrhage     Medications:  Scheduled:  . aspirin EC  81 mg Oral Daily  . calcitRIOL  0.5 mcg Oral Q M,W,F  .  Chlorhexidine Gluconate Cloth  6 each Topical Daily  . Chlorhexidine Gluconate Cloth  6 each Topical Q0600  . dronabinol  2.5 mg Oral BID AC  . erythromycin  250 mg Oral TID AC  . feeding supplement (ENSURE ENLIVE)  237 mL Oral TID BM  . gabapentin  100 mg Oral TID  . insulin aspart  0-15 Units Subcutaneous TID WC  . insulin aspart  0-5 Units Subcutaneous QHS  . insulin glargine  5 Units Subcutaneous QHS  . metoCLOPramide  10 mg Oral TID AC  . [START ON 04/01/2019] midodrine  5 mg Oral 3 times per day on Mon Wed Fri  . multivitamin  1 tablet Oral QHS  . sodium chloride flush  10-40 mL Intracatheter Q12H    Assessment: 49 yom presenting with N/V and abdominal pain. On warfarin PTA for LVAD/Factor 5 Leiden. PTA regimen is 5 mg daily except 7.5 mg on Tuesday/Thursday. Last dose was on 7/20.  INR today is down to 2.8. Hgb 8.5 after 1 PRBC 7/23 and 7/24. LDH trending up slightly to 203 (stable overall). No s/sx of bleeding - bleeding at CVC resolved.   Goal of Therapy:  INR 2-3 Monitor platelets by anticoagulation protocol: Yes   Plan:  Given INR trend will give  low dose of 63m tonight Monitor daily INR, CBC, and for s/sx of bleeding.   FErin HearingPharmD., BCPS Clinical Pharmacist 03/30/2019 10:24 AM

## 2019-03-30 NOTE — Progress Notes (Signed)
[facility] Progress Note  Subjective: no c/o today, nausea better.  Had HD yest, no fluid off.   Vitals:   03/30/19 0800 03/30/19 0900 03/30/19 1000 03/30/19 1100  BP: (!) 118/92 119/90 (!) 143/98 96/81  Pulse: 83 79 77 85  Resp: 15 (!) 5 11 18   Temp:      TempSrc:      SpO2: 100% 100% 99% 97%  Weight:      Height:        Inpatient medications: . aspirin EC  81 mg Oral Daily  . calcitRIOL  0.5 mcg Oral Q M,W,F  . Chlorhexidine Gluconate Cloth  6 each Topical Daily  . Chlorhexidine Gluconate Cloth  6 each Topical Q0600  . dronabinol  2.5 mg Oral BID AC  . erythromycin  250 mg Oral TID AC  . feeding supplement (ENSURE ENLIVE)  237 mL Oral TID BM  . gabapentin  100 mg Oral TID  . insulin aspart  0-15 Units Subcutaneous TID WC  . insulin aspart  0-5 Units Subcutaneous QHS  . insulin glargine  5 Units Subcutaneous QHS  . metoCLOPramide  10 mg Oral TID AC  . [START ON 04/01/2019] midodrine  5 mg Oral 3 times per day on Mon Wed Fri  . multivitamin  1 tablet Oral QHS  . sodium chloride flush  10-40 mL Intracatheter Q12H  . warfarin  1 mg Oral ONCE-1800  . Warfarin - Pharmacist Dosing Inpatient   Does not apply q1800   . ceFEPime (MAXIPIME) IV Stopped (03/29/19 1723)  . vancomycin Stopped (03/29/19 1318)  . vasopressin (PITRESSIN) infusion - *FOR SHOCK* Stopped (03/29/19 1643)   acetaminophen, docusate sodium, fentaNYL (SUBLIMAZE) injection, hydrALAZINE, LORazepam, ondansetron (ZOFRAN) IV, oxyCODONE, promethazine **OR** promethazine **OR** promethazine, sodium chloride flush, traMADol    Exam: Gen alert, no disterss No jvd or bruits Chest clear bilat to bases Cor mechanical heart sounds Abd soft ntnd no mass or ascites +bs, hardware GU normal male MS R foot w 4th toe amp, skin darkening in several areas of the foot Neuro is alert, Ox 3 , nf RIJ TDC    Home meds:  - aspirin 81/ atorvastatin 40 qd  - metoclopramide 5 tid/ erythromycin 250 tid/  dronabinol 2.5 bid  - warfarin daily  - tramadol qid prn/ citalopram 20 qd/ oxy IR  21m prn/ gabapentin 100 bid  - Novolog 7u hs/ insulin glargine 10u qd  - midodrine 5 mg pre HD tiw  - prn's/ vitamins/ supplements    Outpt HD: [facility] MWF  4h 120m   84kg  400/800   2K/3Ca bath  TDC   Hep none  - mircera 200uig last 7/22  - venofer 50/w  - hect 2 ug tiw   Assessment/ Plan: 1. Abd pain /nausea/ vomiting - h/o gastroparesis on mult meds 2. R foot ischemia - sp perc revasc RLE and R 4th toe amp (7/13, 7/16). Has some discoloration of the R foot. VVS following 3. Anemia ABL/ CKD: sp 1u prbc, Hb 8.5 today, stable. Follow 4. ESRD : is on HD MWF.  HD Monday 5. Hypotension/ volume: midodrine ^'d 5 tid per cards. No vol^ on exam 6. CHF/ severe ICM: s/p LVAD 2019 7. Chronic gastroparesis 8. DM2    [facility] 03/30/2019, 12:00 PM  Iron/TIBC/Ferritin/ %Sat    Component Value Date/Time   IRON 40 (L) 11/02/2017 0016   TIBC 188 (L) 11/02/2017 0016   FERRITIN 523 (H) 11/02/2017 0016  IRONPCTSAT 21 11/02/2017 0016   Recent Labs  Lab 03/27/19 1351  03/30/19 0316  NA 135   < > 133*  K 4.0   < > 3.7  CL 95*   < > 96*  CO2 23   < > 26  GLUCOSE 114*   < > 226*  BUN 14   < > 26*  CREATININE 4.66*   < > 5.73*  CALCIUM 9.8   < > 8.3*  ALBUMIN 3.1*  --   --   INR 3.8*   < > 2.8*   < > = values in this interval not displayed.   Recent Labs  Lab 03/27/19 1351  AST 28  ALT 9  ALKPHOS 105  BILITOT 0.7  PROT 8.3*   Recent Labs  Lab 03/30/19 0316  WBC 20.0*  HGB 8.5*  HCT 26.4*  PLT 267

## 2019-03-30 NOTE — Progress Notes (Addendum)
Advanced Heart Failure VAD Team Note  PCP-Cardiologist: No primary care provider on file.   Subjective:    Admitted with intractable N/V and R foot pain.   Developed hypotension on HD yesterday. MAPs now in 90-100 range. Mild R foot pain.   Feels weak. Remains nauseated. No BM since Tuesday.  INR now coming down -> 2.8   LVAD INTERROGATION:  HeartMate 3 LVAD:   Flow 4.0 liters/min, speed 5500 , power 4 , PI 4.2   Objective:    Vital Signs:   Temp:  [97.7 F (36.5 C)-99.7 F (37.6 C)] 98.6 F (37 C) (07/25 0724) Pulse Rate:  [27-99] 61 (07/25 0600) Resp:  [10-31] 13 (07/25 0600) BP: (42-132)/(24-105) 85/69 (07/25 0600) SpO2:  [94 %-100 %] 99 % (07/25 0600) Weight:  [84.6 kg-85.3 kg] 85.3 kg (07/25 0444) Last BM Date: (PTA per pt) Mean arterial Pressure 70-80s Intake/Output:   Intake/Output Summary (Last 24 hours) at 03/30/2019 0946 Last data filed at 03/29/2019 2000 Gross per 24 hour  Intake 789 ml  Output 0 ml  Net 789 ml     Physical Exam   Physical Exam: General:  Weak appearing. Curled up in bed   HEENT: normal  Neck: supple. JVP not elevated. RIJ TLC  Carotids 2+ bilat; no bruits. No lymphadenopathy or thryomegaly appreciated. Cor: LVAD hum.  Lungs: Clear. Abdomen:  soft, nontender, non-distended. No hepatosplenomegaly. No bruits or masses. Hypoactive bowel sounds. Driveline site clean. Anchor in place.  Extremities: no cyanosis, clubbing, rash  R foot s/p 4th toe amputation great toe, 2nd toe, 3rd, 5th toe tip black. Fore foot erythema.  Neuro: alert & oriented x 3. No focal deficits. Moves all 4 without problem    Telemetry   NSR 70-80s with PVCs. Personally reviewed   EKG    N/a   Labs   Basic Metabolic Panel: Recent Labs  Lab 03/27/19 1351 03/28/19 0318 03/29/19 0337 03/30/19 0316  NA 135 134* 133* 133*  K 4.0 4.7 4.2 3.7  CL 95* 92* 99 96*  CO2 23 22 23 26   GLUCOSE 114* 164* 195* 226*  BUN 14 25* 43* 26*  CREATININE 4.66* 6.47*  8.43* 5.73*  CALCIUM 9.8 9.2 8.3* 8.3*    Liver Function Tests: Recent Labs  Lab 03/27/19 1351  AST 28  ALT 9  ALKPHOS 105  BILITOT 0.7  PROT 8.3*  ALBUMIN 3.1*   No results for input(s): LIPASE, AMYLASE in the last 168 hours. No results for input(s): AMMONIA in the last 168 hours.  CBC: Recent Labs  Lab 03/27/19 1351  03/28/19 1358 03/28/19 1435 03/28/19 2001 03/29/19 0337 03/30/19 0316  WBC 17.1*   < > 19.9* 19.2* 18.4* 18.9* 20.0*  NEUTROABS 14.9*  --   --   --   --   --   --   HGB 9.9*   < > 6.6* 6.5* 7.3* 7.2* 8.5*  HCT 31.5*   < > 21.2* 20.5* 22.3* 22.0* 26.4*  MCV 94.9   < > 95.5 94.9 93.7 92.4 93.6  PLT 322   < > 302 293 244 259 267   < > = values in this interval not displayed.    INR: Recent Labs  Lab 03/27/19 1351 03/27/19 2044 03/28/19 0318 03/29/19 0337 03/30/19 0316  INR 3.8* 3.3* 4.0* 4.4* 2.8*    Other results:    Imaging   No results found.   Medications:     Scheduled Medications: . aspirin EC  81 mg Oral  Daily  . calcitRIOL  0.5 mcg Oral Q M,W,F  . Chlorhexidine Gluconate Cloth  6 each Topical Daily  . Chlorhexidine Gluconate Cloth  6 each Topical Q0600  . dronabinol  2.5 mg Oral BID AC  . erythromycin  250 mg Oral TID AC  . feeding supplement (ENSURE ENLIVE)  237 mL Oral TID BM  . gabapentin  100 mg Oral TID  . insulin aspart  0-15 Units Subcutaneous TID WC  . insulin aspart  0-5 Units Subcutaneous QHS  . insulin glargine  5 Units Subcutaneous QHS  . metoCLOPramide  10 mg Oral TID AC  . midodrine  5 mg Oral TID WC  . multivitamin  1 tablet Oral QHS  . sodium chloride flush  10-40 mL Intracatheter Q12H    Infusions: . ceFEPime (MAXIPIME) IV Stopped (03/29/19 1723)  . vancomycin Stopped (03/29/19 1318)  . vasopressin (PITRESSIN) infusion - *FOR SHOCK* Stopped (03/29/19 1643)    PRN Medications: acetaminophen, docusate sodium, fentaNYL (SUBLIMAZE) injection, hydrALAZINE, LORazepam, ondansetron (ZOFRAN) IV, oxyCODONE,  promethazine **OR** promethazine **OR** promethazine, sodium chloride flush, traMADol    Assessment/Plan:    1. Nausea/Vomiting,intractable  - Due to severe gastroparesis.  - Reglan restarted. Receiving Continue phenergan and ativan regualrly  - Minimal improvement  - Need to ambulate and minimize narcotics  2. 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  - R Foot warm able to doppler pedal pulse but great toes,2nd, 3rd 5th toe tips black with some concern for ischemic changes extending to forefoot.  - White count continues to go up. 18K -> 20K despite broad spectrum abx - Will check ESR and x-rays - VVS following. Suspect he may eed more proximal LE amputation   3. Chronic Heart Failure with HMIII VAD - Volume status stable with ESRD - LDH 203 INR 2.8 - Pharmacy consulted coumadin. Holding coumadin until INR comes down.   - BP low. Midodrine restarted - VAD interrogated personally. Parameters stable.  4. ESRD - Last HD 7/24  5. ID  - WBC 17>18.6 >18.9 > 20.0 - Blood CX x2- NGTD - Concern for infection - Check foot xray & ESR  6. Anemia  - Hgb trending down from 9.3> 8.4>6.5>7.2. No additional oozing.  - Received 1UPRBC 7/23 and another 7/24  7. Hypotension - Improved on midodrine. Now hypertensive. Will use midodrine only on HD  8. Narcotic dependence - Need to limit as much as possible to help with gastroparesis  Can go to 2c  I reviewed the LVAD parameters from today, and compared the results to the patient's prior recorded data.  No programming changes were made.  The LVAD is functioning within specified parameters.  The patient performs LVAD self-test daily.  LVAD interrogation was + 3 low flows. LVAD equipment check completed and is in good working order.  Back-up equipment present.   LVAD education done on emergency procedures and precautions and reviewed exit site care.  Length of Stay: 3  [name], MD  03/30/2019, 9:46 AM  VAD Team --- VAD ISSUES ONLY--- Pager [phone] (7am - 7am)  Advanced Heart Failure Team  Pager [phone] (M-F; 7a - 4p)  Please contact [facility] for night-coverage after hours (4p -7a ) and weekends on [email or web address]

## 2019-03-31 DIAGNOSIS — Z89421 Acquired absence of other right toe(s): Secondary | ICD-10-CM

## 2019-03-31 DIAGNOSIS — K3184 Gastroparesis: Secondary | ICD-10-CM

## 2019-03-31 DIAGNOSIS — Z888 Allergy status to other drugs, medicaments and biological substances status: Secondary | ICD-10-CM

## 2019-03-31 DIAGNOSIS — I998 Other disorder of circulatory system: Secondary | ICD-10-CM

## 2019-03-31 DIAGNOSIS — Z9862 Peripheral vascular angioplasty status: Secondary | ICD-10-CM

## 2019-03-31 DIAGNOSIS — Z992 Dependence on renal dialysis: Secondary | ICD-10-CM

## 2019-03-31 LAB — PROTIME-INR
INR: 1.9 — ABNORMAL HIGH (ref 0.8–1.2)
Prothrombin Time: 21.1 seconds — ABNORMAL HIGH (ref 11.4–15.2)

## 2019-03-31 LAB — GLUCOSE, CAPILLARY
Glucose-Capillary: 137 mg/dL — ABNORMAL HIGH (ref 70–99)
Glucose-Capillary: 140 mg/dL — ABNORMAL HIGH (ref 70–99)
Glucose-Capillary: 169 mg/dL — ABNORMAL HIGH (ref 70–99)
Glucose-Capillary: 185 mg/dL — ABNORMAL HIGH (ref 70–99)
Glucose-Capillary: 212 mg/dL — ABNORMAL HIGH (ref 70–99)

## 2019-03-31 LAB — LACTATE DEHYDROGENASE: LDH: 211 U/L — ABNORMAL HIGH (ref 98–192)

## 2019-03-31 LAB — BASIC METABOLIC PANEL
Anion gap: 14 (ref 5–15)
BUN: 38 mg/dL — ABNORMAL HIGH (ref 6–20)
CO2: 26 mmol/L (ref 22–32)
Calcium: 8.6 mg/dL — ABNORMAL LOW (ref 8.9–10.3)
Chloride: 97 mmol/L — ABNORMAL LOW (ref 98–111)
Creatinine, Ser: 8.16 mg/dL — ABNORMAL HIGH (ref 0.61–1.24)
GFR calc Af Amer: 8 mL/min — ABNORMAL LOW (ref >60)
GFR calc non Af Amer: 7 mL/min — ABNORMAL LOW (ref >60)
Glucose, Bld: 176 mg/dL — ABNORMAL HIGH (ref 70–99)
Potassium: 3.7 mmol/L (ref 3.5–5.1)
Sodium: 137 mmol/L (ref 135–145)

## 2019-03-31 LAB — CBC
HCT: 25.5 % — ABNORMAL LOW (ref 39.0–52.0)
Hemoglobin: 8.2 g/dL — ABNORMAL LOW (ref 13.0–17.0)
MCH: 30.6 pg (ref 26.0–34.0)
MCHC: 32.2 g/dL (ref 30.0–36.0)
MCV: 95.1 fL (ref 80.0–100.0)
Platelets: 263 10*3/uL (ref 150–400)
RBC: 2.68 MIL/uL — ABNORMAL LOW (ref 4.22–5.81)
RDW: 15.7 % — ABNORMAL HIGH (ref 11.5–15.5)
WBC: 17 10*3/uL — ABNORMAL HIGH (ref 4.0–10.5)
nRBC: 2.1 % — ABNORMAL HIGH (ref 0.0–0.2)

## 2019-03-31 MED ORDER — WARFARIN SODIUM 5 MG PO TABS
5.0000 mg | ORAL_TABLET | Freq: Once | ORAL | Status: AC
  Administered 2019-03-31: 18:00:00 5 mg via ORAL
  Filled 2019-03-31: qty 1

## 2019-03-31 MED ORDER — CHLORHEXIDINE GLUCONATE CLOTH 2 % EX PADS
6.0000 | MEDICATED_PAD | Freq: Every day | CUTANEOUS | Status: DC
  Administered 2019-04-01 – 2019-04-03 (×3): 6 via TOPICAL

## 2019-03-31 NOTE — Plan of Care (Signed)
  Problem: Education: Goal: Patient will understand all VAD equipment and how it functions Outcome: Progressing Goal: Patient will be able to verbalize current INR target range and antiplatelet therapy for discharge home Outcome: Progressing   Problem: Cardiac: Goal: LVAD will function as expected and patient will experience no clinical alarms Outcome: Progressing   Problem: Education: Goal: Knowledge of General Education information will improve Description: Including pain rating scale, medication(s)/side effects and non-pharmacologic comfort measures Outcome: Progressing   Problem: Health Behavior/Discharge Planning: Goal: Ability to manage health-related needs will improve Outcome: Progressing   Problem: Clinical Measurements: Goal: Ability to maintain clinical measurements within normal limits will improve Outcome: Progressing Goal: Will remain free from infection Outcome: Progressing Goal: Diagnostic test results will improve Outcome: Progressing Goal: Respiratory complications will improve Outcome: Progressing Goal: Cardiovascular complication will be avoided Outcome: Progressing   Problem: Activity: Goal: Risk for activity intolerance will decrease Outcome: Progressing   Problem: Nutrition: Goal: Adequate nutrition will be maintained Outcome: Progressing   Problem: Coping: Goal: Level of anxiety will decrease Outcome: Progressing   Problem: Elimination: Goal: Will not experience complications related to bowel motility Outcome: Progressing Goal: Will not experience complications related to urinary retention Outcome: Progressing   Problem: Pain Managment: Goal: General experience of comfort will improve Outcome: Progressing   Problem: Safety: Goal: Ability to remain free from injury will improve Outcome: Progressing   Problem: Skin Integrity: Goal: Risk for impaired skin integrity will decrease Outcome: Progressing

## 2019-03-31 NOTE — Consult Note (Signed)
[place] for Infectious Disease       Reason for Consult: concern for osteomyelitis    Referring Physician: Dr. Aundra Dubin  Active Problems:   Nausea and vomiting in adult   Gastroparesis   ESRD on dialysis [facility])   Chronic systolic CHF (congestive heart failure) ([place])   LVAD (left ventricular assist device) present (HCC)   Nausea & vomiting   Ischemic foot   . aspirin EC  81 mg Oral Daily  . calcitRIOL  0.5 mcg Oral Q M,W,F  . Chlorhexidine Gluconate Cloth  6 each Topical Daily  . Chlorhexidine Gluconate Cloth  6 each Topical Q0600  . dronabinol  2.5 mg Oral BID AC  . erythromycin  250 mg Oral TID AC  . feeding supplement (ENSURE ENLIVE)  237 mL Oral TID BM  . gabapentin  100 mg Oral TID  . insulin aspart  0-15 Units Subcutaneous TID WC  . insulin aspart  0-5 Units Subcutaneous QHS  . insulin glargine  5 Units Subcutaneous QHS  . metoCLOPramide  10 mg Oral TID AC  . [START ON 04/01/2019] midodrine  5 mg Oral 3 times per day on Mon Wed Fri  . multivitamin  1 tablet Oral QHS  . sodium chloride flush  10-40 mL Intracatheter Q12H  . warfarin  5 mg Oral ONCE-1800  . Warfarin - Pharmacist Dosing Inpatient   Does not apply q1800    Recommendations: Will continue antibiotics for now Further evaluation by Dr. Donzetta Matters   Assessment: He has dusky colored toes but no purulence.  Xray of the foot suggests some 'cortical indistinctness' at the 4th metatarsal head.  This is not necessarily specific to osteomyelitis compared to bony changes related to recent toe amputation.  I am not convinced this is an infectious process.  I would expect this to be direct spread, acute process and it does not appear infected clinically.  I am in agreement that further amputation is a consideration.  Leukocytosis and ESR non-specific and may be reactive from his vascular issues.   I will continue to follow  Antibiotics: Vancomycin and cefepime  HPI: [name] is a 53 y.o. male with LVAD,  ESRD and critical limb ischemia s/p balloon angioplasty, stent to SFA on 7/13 then on 7/16 underwent right fourth ray amputation for dry gangrenous changes and pain.  He was admitted again on 7/22 with issues due to his gastroparesis and has had more pain in his right foot.  He describes the pain at the toes as intermittent, particularly with activity and does resolve with rest.  He has been evaluated by vascular surgery and concern for a need for it healing.  He has had leukocytosis up to 20 but no fever.  ESR of 70.  He otherwise has no sob, no cough, no diarrhea.   xray independently reviewed, cortical irregularity noted.   Review of Systems:  Constitutional: negative for fevers and chills Respiratory: negative for cough or sputum Cardiovascular: negative for chest pain Integument/breast: negative for rash All other systems reviewed and are negative    Past Medical History:  Diagnosis Date  . Angina   . ASCVD (arteriosclerotic cardiovascular disease)    , Anterior infarction 2005, LAD diagonal bifurcation intervention 03/2004  . Automatic implantable cardiac defibrillator -[employer]       . Benign neoplasm of colon   . CHF (congestive heart failure) ([place])   . Chronic systolic heart failure ([place])   . Coronary artery disease  Widely patent previously placed stents in the left anterior   . Crohn's disease ([place])   . Deep venous thrombosis (HCC)    Recurrent-on Coumadin  . Dialysis patient ([place])   . Dyspnea   . Gastroparesis   . GERD (gastroesophageal reflux disease)   . High cholesterol   . Hyperlipidemia   . Hypersomnolent    Previous diagnosis of narcolepsy  . Hypertension, essential   . Ischemic cardiomyopathy    Ejection fraction 15-20% catheterization 2010  . Type II or unspecified type diabetes mellitus without mention of complication, not stated as uncontrolled   . Unspecified gastritis and gastroduodenitis without mention of hemorrhage     Social History   Tobacco  Use  . Smoking status: Never Smoker  . Smokeless tobacco: Never Used  Substance Use Topics  . Alcohol use: No  . Drug use: No    Family History  Problem Relation Age of Onset  . Colon polyps Mother   . Diabetes Mother   . Heart attack Father   . Diabetes Father   . Stroke Paternal Grandfather   . Colitis Sister   . Heart disease Brother   . Colitis Sister   . Colon cancer Neg Hx     Allergies  Allergen Reactions  . Metformin And Related Diarrhea    Physical Exam: Constitutional: in no apparent distress  Vitals:   03/31/19 0750 03/31/19 0900  BP: (!) 116/98   Pulse: 80 81  Resp: 14 11  Temp: 98 F (36.7 C)   SpO2: 100% 100%   EYES: anicteric ENMT: no thrush Cardiovascular: LVAD  Respiratory: CTA B; normal respiratory effort GI: Bowel sounds are normal, liver is not enlarged, spleen is not enlarged Musculoskeletal: right foot s/p 4th ray amputation, darkened toes, no warmth, no discharge, no significant erythema, no tenderness.  Skin: no rash Neuro: non-focal  Lab Results  Component Value Date   WBC 17.0 (H) 03/31/2019   HGB 8.2 (L) 03/31/2019   HCT 25.5 (L) 03/31/2019   MCV 95.1 03/31/2019   PLT 263 03/31/2019    Lab Results  Component Value Date   CREATININE 8.16 (H) 03/31/2019   BUN 38 (H) 03/31/2019   NA 137 03/31/2019   K 3.7 03/31/2019   CL 97 (L) 03/31/2019   CO2 26 03/31/2019    Lab Results  Component Value Date   ALT 9 03/27/2019   AST 28 03/27/2019   ALKPHOS 105 03/27/2019     Microbiology: Recent Results (from the past 240 hour(s))  SARS Coronavirus 2 (CEPHEID- Performed in [place] hospital lab), Hosp Order     Status: None   Collection Time: 03/27/19  4:20 PM   Specimen: Nasopharyngeal Swab  Result Value Ref Range Status   SARS Coronavirus 2 NEGATIVE NEGATIVE Final    Comment: (NOTE) If result is NEGATIVE SARS-CoV-2 target nucleic acids are NOT DETECTED. The SARS-CoV-2 RNA is generally detectable in upper and lower   respiratory specimens during the acute phase of infection. The lowest  concentration of SARS-CoV-2 viral copies this assay can detect is 250  copies / mL. A negative result does not preclude SARS-CoV-2 infection  and should not be used as the sole basis for treatment or other  patient management decisions.  A negative result may occur with  improper specimen collection / handling, submission of specimen other  than nasopharyngeal swab, presence of viral mutation(s) within the  areas targeted by this assay, and inadequate number of viral copies  (<250 copies /  mL). A negative result must be combined with clinical  observations, patient history, and epidemiological information. If result is POSITIVE SARS-CoV-2 target nucleic acids are DETECTED. The SARS-CoV-2 RNA is generally detectable in upper and lower  respiratory specimens dur ing the acute phase of infection.  Positive  results are indicative of active infection with SARS-CoV-2.  Clinical  correlation with patient history and other diagnostic information is  necessary to determine patient infection status.  Positive results do  not rule out bacterial infection or co-infection with other viruses. If result is PRESUMPTIVE POSTIVE SARS-CoV-2 nucleic acids MAY BE PRESENT.   A presumptive positive result was obtained on the submitted specimen  and confirmed on repeat testing.  While 2019 novel coronavirus  (SARS-CoV-2) nucleic acids may be present in the submitted sample  additional confirmatory testing may be necessary for epidemiological  and / or clinical management purposes  to differentiate between  SARS-CoV-2 and other Sarbecovirus currently known to infect humans.  If clinically indicated additional testing with an alternate test  methodology [phone]) is advised. The SARS-CoV-2 RNA is generally  detectable in upper and lower respiratory sp ecimens during the acute  phase of infection. The expected result is Negative. Fact  Sheet for Patients:  StrictlyIdeas.no Fact Sheet for Healthcare Providers: BankingDealers.co.za This test is not yet approved or cleared by the [place] FDA and has been authorized for detection and/or diagnosis of SARS-CoV-2 by FDA under an Emergency Use Authorization (EUA).  This EUA will remain in effect (meaning this test can be used) for the duration of the COVID-19 declaration under Section 564(b)(1) of the Act, 21 U.S.C. section 360bbb-3(b)(1), unless the authorization is terminated or revoked sooner. Performed at [facility], [place] [address], [place], [place] [address]   Culture, blood (routine x 2)     Status: None (Preliminary result)   Collection Time: 03/27/19  4:40 PM   Specimen: BLOOD  Result Value Ref Range Status   Specimen Description BLOOD WRIST RIGHT  Final   Special Requests   Final    BOTTLES DRAWN AEROBIC AND ANAEROBIC Blood Culture results may not be optimal due to an inadequate volume of blood received in culture bottles   Culture   Final    NO GROWTH 4 DAYS Performed at [facility], [place] [address] [place], [place] [address]    Report Status PENDING  Incomplete  Culture, blood (routine x 2)     Status: None (Preliminary result)   Collection Time: 03/27/19  4:47 PM   Specimen: BLOOD  Result Value Ref Range Status   Specimen Description BLOOD LEFT WRIST  Final   Special Requests   Final    BOTTLES DRAWN AEROBIC ONLY Blood Culture results may not be optimal due to an inadequate volume of blood received in culture bottles   Culture   Final    NO GROWTH 4 DAYS Performed at [facility], [place] [address], [place],  [address]    Report Status PENDING  Incomplete  MRSA PCR Screening     Status: None   Collection Time: 03/28/19  2:08 AM   Specimen: Nasopharyngeal  Result Value Ref Range Status   MRSA by PCR NEGATIVE NEGATIVE Final    Comment:        The GeneXpert MRSA Assay (FDA  approved for NASAL specimens only), is one component of a comprehensive MRSA colonization surveillance program. It is not intended to diagnose MRSA infection nor to guide or monitor treatment  for MRSA infections. Performed at [facility], [place] [address] [place], [place] [address]     Tej Murdaugh W Schae Cando, [place] for Infectious Disease [facility] [email or web address] 03/31/2019, 12:09 PM

## 2019-03-31 NOTE — Progress Notes (Signed)
[place] for warfarin Indication: LVAD  Allergies  Allergen Reactions  . Metformin And Related Diarrhea    Patient Measurements: Wt: 85.4 kg Ht: 5'7"  Vital Signs: Temp: 98 F (36.7 C) (07/26 0750) Temp Source: Oral (07/26 0750) BP: 116/98 (07/26 0750) Pulse Rate: 80 (07/26 0750)  Labs: Recent Labs    03/29/19 0337 03/30/19 0316 03/31/19 0325  HGB 7.2* 8.5* 8.2*  HCT 22.0* 26.4* 25.5*  PLT 259 267 263  LABPROT 41.2* 29.4* 21.1*  INR 4.4* 2.8* 1.9*  CREATININE 8.43* 5.73* 8.16*    Estimated Creatinine Clearance: 10.9 mL/min (A) (by C-G formula based on SCr of 8.16 mg/dL (H)).   Medical History: Past Medical History:  Diagnosis Date  . Angina   . ASCVD (arteriosclerotic cardiovascular disease)    , Anterior infarction 2005, LAD diagonal bifurcation intervention 03/2004  . Automatic implantable cardiac defibrillator -[employer]. Jude's       . Benign neoplasm of colon   . CHF (congestive heart failure) ([place])   . Chronic systolic heart failure ([place])   . Coronary artery disease     Widely patent previously placed stents in the left anterior   . Crohn's disease ([place])   . Deep venous thrombosis ([facility])    Recurrent-on Coumadin  . Dialysis patient ([place])   . Dyspnea   . Gastroparesis   . GERD (gastroesophageal reflux disease)   . High cholesterol   . Hyperlipidemia   . Hypersomnolent    Previous diagnosis of narcolepsy  . Hypertension, essential   . Ischemic cardiomyopathy    Ejection fraction 15-20% catheterization 2010  . Type II or unspecified type diabetes mellitus without mention of complication, not stated as uncontrolled   . Unspecified gastritis and gastroduodenitis without mention of hemorrhage     Medications:  Scheduled:  . aspirin EC  81 mg Oral Daily  . calcitRIOL  0.5 mcg Oral Q M,W,F  . Chlorhexidine Gluconate Cloth  6 each Topical Daily  . Chlorhexidine Gluconate Cloth  6 each Topical Q0600  . dronabinol   2.5 mg Oral BID AC  . erythromycin  250 mg Oral TID AC  . feeding supplement (ENSURE ENLIVE)  237 mL Oral TID BM  . gabapentin  100 mg Oral TID  . insulin aspart  0-15 Units Subcutaneous TID WC  . insulin aspart  0-5 Units Subcutaneous QHS  . insulin glargine  5 Units Subcutaneous QHS  . metoCLOPramide  10 mg Oral TID AC  . [START ON 04/01/2019] midodrine  5 mg Oral 3 times per day on Mon Wed Fri  . multivitamin  1 tablet Oral QHS  . sodium chloride flush  10-40 mL Intracatheter Q12H  . Warfarin - Pharmacist Dosing Inpatient   Does not apply q1800    Assessment: 35 yom presenting with N/V and abdominal pain. On warfarin PTA for LVAD/Factor 5 Leiden. PTA regimen is 5 mg daily except 7.5 mg on Tuesday/Thursday. Last dose was on 7/20.  INR today continues down trend after low dose warfarin given last night 4.4>2.8>1.9 . Hgb 8.2 after 1 PRBC 7/23 and 7/24. LDH trending up slightly to 211. No s/sx of bleeding noted.   Diet seems to be improving. Reinvolving vascular tomorrow to discuss wider amputation so will try and not be overly aggressive with warfarin dosing.    Patient continues on vancomycin and cefepime. Tmax 99 and wbc down to 17. Cardiology plans to involve ID on antibiotic recommendations.   Goal of Therapy:  INR 2-3 Monitor platelets by anticoagulation protocol: Yes   Plan:  Will give 12m tonight Continue daily INR   FErin HearingPharmD., BCPS Clinical Pharmacist 03/31/2019 9:24 AM

## 2019-03-31 NOTE — Plan of Care (Signed)
  Problem: Education: Goal: Patient will understand all VAD equipment and how it functions Outcome: Progressing   Problem: Education: Goal: Patient will be able to verbalize current INR target range and antiplatelet therapy for discharge home Outcome: Progressing   Problem: Nutrition: Goal: Adequate nutrition will be maintained Outcome: Progressing   Problem: Coping: Goal: Level of anxiety will decrease Outcome: Progressing   Problem: Pain Managment: Goal: General experience of comfort will improve Outcome: Progressing   Problem: Activity: Goal: Risk for activity intolerance will decrease Outcome: Not Progressing

## 2019-03-31 NOTE — Progress Notes (Signed)
[facility] Progress Note  Subjective: no new c/o  Vitals:   03/31/19 0303 03/31/19 0450 03/31/19 0750 03/31/19 0900  BP: 107/81  (!) 116/98   Pulse: 77  80 81  Resp: 12  14 11   Temp: 98 F (36.7 C)  98 F (36.7 C)   TempSrc: Oral  Oral   SpO2: 100%  100% 100%  Weight:  87.2 kg    Height:        Inpatient medications: . aspirin EC  81 mg Oral Daily  . calcitRIOL  0.5 mcg Oral Q M,W,F  . Chlorhexidine Gluconate Cloth  6 each Topical Daily  . Chlorhexidine Gluconate Cloth  6 each Topical Q0600  . dronabinol  2.5 mg Oral BID AC  . erythromycin  250 mg Oral TID AC  . feeding supplement (ENSURE ENLIVE)  237 mL Oral TID BM  . gabapentin  100 mg Oral TID  . insulin aspart  0-15 Units Subcutaneous TID WC  . insulin aspart  0-5 Units Subcutaneous QHS  . insulin glargine  5 Units Subcutaneous QHS  . metoCLOPramide  10 mg Oral TID AC  . [START ON 04/01/2019] midodrine  5 mg Oral 3 times per day on Mon Wed Fri  . multivitamin  1 tablet Oral QHS  . sodium chloride flush  10-40 mL Intracatheter Q12H  . warfarin  5 mg Oral ONCE-1800  . Warfarin - Pharmacist Dosing Inpatient   Does not apply q1800   . ceFEPime (MAXIPIME) IV 1 g (03/30/19 1700)  . vancomycin Stopped (03/29/19 1318)  . vasopressin (PITRESSIN) infusion - *FOR SHOCK* Stopped (03/29/19 1643)   acetaminophen, docusate sodium, fentaNYL (SUBLIMAZE) injection, hydrALAZINE, LORazepam, ondansetron (ZOFRAN) IV, oxyCODONE, promethazine **OR** promethazine **OR** promethazine, sodium chloride flush, traMADol    Exam: Gen alert, no distress No jvd or bruits Chest clear bilat to bases Cor mechanical heart sounds Abd soft ntnd no mass or ascites +bs, hardware MS R foot w 4th toe amp, skin darkening in several areas of the foot Neuro is alert, Ox 3 , nf RIJ TDC    Home meds:  - aspirin 81/ atorvastatin 40 qd  - metoclopramide 5 tid/ erythromycin 250 tid/ dronabinol 2.5 bid  - warfarin daily  - tramadol qid  prn/ citalopram 20 qd/ oxy IR  47m prn/ gabapentin 100 bid  - Novolog 7u hs/ insulin glargine 10u qd  - midodrine 5 mg pre HD tiw  - prn's/ vitamins/ supplements    Outpt HD: [facility] MWF  4h 151m   84kg  400/800   2K/3Ca bath  TDC   Hep none  - mircera 200uig last 7/22  - venofer 50/w  - hect 2 ug tiw   Assessment/ Plan: 1. Abd pain /nausea/ vomiting - h/o gastroparesis on mult meds 2. R foot ischemia - sp perc revasc RLE and R 4th toe amp (7/13, 7/16). Has some discoloration of the R foot. VVS following 3. Anemia ABL/ CKD: sp 1u prbc, Hb 8.2, stable. Follow 4. ESRD: on HD MWF.  HD Monday 5. Hypotension/ volume: midodrine ^'d 5 tid per cards. No vol^ on exam 6. CHF/ severe ICM: s/p LVAD 2019 7. Chronic gastroparesis 8. DM2    [facility] 03/31/2019, 12:13 PM  Iron/TIBC/Ferritin/ %Sat    Component Value Date/Time   IRON 40 (L) 11/02/2017 0016   TIBC 188 (L) 11/02/2017 0016   FERRITIN 523 (H) 11/02/2017 0016   IRONPCTSAT 21 11/02/2017 0016   Recent Labs  Lab  03/27/19 1351  03/31/19 0325  NA 135   < > 137  K 4.0   < > 3.7  CL 95*   < > 97*  CO2 23   < > 26  GLUCOSE 114*   < > 176*  BUN 14   < > 38*  CREATININE 4.66*   < > 8.16*  CALCIUM 9.8   < > 8.6*  ALBUMIN 3.1*  --   --   INR 3.8*   < > 1.9*   < > = values in this interval not displayed.   Recent Labs  Lab 03/27/19 1351  AST 28  ALT 9  ALKPHOS 105  BILITOT 0.7  PROT 8.3*   Recent Labs  Lab 03/31/19 0325  WBC 17.0*  HGB 8.2*  HCT 25.5*  PLT 263

## 2019-03-31 NOTE — Progress Notes (Signed)
Patient ID: [name], male   DOB: February 26, 1966, 53 y.o.   MRN: [id number]   Advanced Heart Failure VAD Team Note  PCP-Cardiologist: No primary care provider on file.   Subjective:    Admitted with intractable N/V and R foot pain.   Nausea better today, eating and has had a bowel movement.  Foot pain better.   WBCs 20 => 17, afebrile.  ESR 70.  Right foot film with possible osteomyelitis 4th metatarsal head.   Walked in hall with walker.   LVAD INTERROGATION:  HeartMate 3 LVAD:   Flow 3.8 liters/min, speed 5500 , power 4.4 , PI 4.2. 2 PI events.   Objective:    Vital Signs:   Temp:  [97.9 F (36.6 C)-99.3 F (37.4 C)] 98 F (36.7 C) (07/26 0750) Pulse Rate:  [72-95] 80 (07/26 0750) Resp:  [5-27] 14 (07/26 0750) BP: (82-143)/(58-99) 116/98 (07/26 0750) SpO2:  [93 %-100 %] 100 % (07/26 0750) Weight:  [87.2 kg] 87.2 kg (07/26 0450) Last BM Date: 03/31/19 Mean arterial Pressure 70-80s Intake/Output:   Intake/Output Summary (Last 24 hours) at 03/31/2019 0843 Last data filed at 03/31/2019 0751 Gross per 24 hour  Intake 1019.2 ml  Output -  Net 1019.2 ml     Physical Exam   General: Well appearing this am. NAD.  HEENT: Normal. Neck: Supple, JVP 7-8 cm. Carotids OK.  Cardiac:  Mechanical heart sounds with LVAD hum present.  Lungs:  CTAB, normal effort.  Abdomen:  NT, ND, no HSM. No bruits or masses. +BS  LVAD exit site: Well-healed and incorporated. Dressing dry and intact. No erythema or drainage. Stabilization device present and accurately applied. Driveline dressing changed daily per sterile technique. Extremities:  Warm and dry. No cyanosis, clubbing, rash, or edema.  S/p right 4th toe amputation.  The tips of the remaining toes are dark.  Neuro:  Alert & oriented x 3. Cranial nerves grossly intact. Moves all 4 extremities w/o difficulty. Affect pleasant     Telemetry   NSR 70-80s. Personally reviewed   EKG    N/a   Labs   Basic Metabolic Panel:  Recent Labs  Lab 03/27/19 1351 03/28/19 0318 03/29/19 0337 03/30/19 0316 03/31/19 0325  NA 135 134* 133* 133* 137  K 4.0 4.7 4.2 3.7 3.7  CL 95* 92* 99 96* 97*  CO2 23 22 23 26 26   GLUCOSE 114* 164* 195* 226* 176*  BUN 14 25* 43* 26* 38*  CREATININE 4.66* 6.47* 8.43* 5.73* 8.16*  CALCIUM 9.8 9.2 8.3* 8.3* 8.6*    Liver Function Tests: Recent Labs  Lab 03/27/19 1351  AST 28  ALT 9  ALKPHOS 105  BILITOT 0.7  PROT 8.3*  ALBUMIN 3.1*   No results for input(s): LIPASE, AMYLASE in the last 168 hours. No results for input(s): AMMONIA in the last 168 hours.  CBC: Recent Labs  Lab 03/27/19 1351  03/28/19 1435 03/28/19 2001 03/29/19 0337 03/30/19 0316 03/31/19 0325  WBC 17.1*   < > 19.2* 18.4* 18.9* 20.0* 17.0*  NEUTROABS 14.9*  --   --   --   --   --   --   HGB 9.9*   < > 6.5* 7.3* 7.2* 8.5* 8.2*  HCT 31.5*   < > 20.5* 22.3* 22.0* 26.4* 25.5*  MCV 94.9   < > 94.9 93.7 92.4 93.6 95.1  PLT 322   < > 293 244 259 267 263   < > = values in this interval not displayed.  INR: Recent Labs  Lab 03/27/19 2044 03/28/19 0318 03/29/19 0337 03/30/19 0316 03/31/19 0325  INR 3.3* 4.0* 4.4* 2.8* 1.9*    Other results:    Imaging   Dg Foot Complete Right  Result Date: 03/30/2019 CLINICAL DATA:  53 year old male with RIGHT foot pain. 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 EXAM: RIGHT FOOT COMPLETE - 3+ VIEW COMPARISON:  03/15/2019 FINDINGS: Interval amputation of the 4th digit is noted. Cortical indistinctness of the 4th metatarsal head is noted which may represent osteomyelitis. Heavy vascular calcifications are present. No other acute bony abnormalities are noted. IMPRESSION: Interval 4th digit amputation with new cortical indistinctness of the 4th metatarsal head suspicious for osteomyelitis. Electronically Signed   By: Margarette Canada M.D.   On: 03/30/2019 14:34     Medications:     Scheduled Medications: . aspirin EC  81 mg Oral Daily  . calcitRIOL  0.5 mcg Oral Q  M,W,F  . Chlorhexidine Gluconate Cloth  6 each Topical Daily  . Chlorhexidine Gluconate Cloth  6 each Topical Q0600  . dronabinol  2.5 mg Oral BID AC  . erythromycin  250 mg Oral TID AC  . feeding supplement (ENSURE ENLIVE)  237 mL Oral TID BM  . gabapentin  100 mg Oral TID  . insulin aspart  0-15 Units Subcutaneous TID WC  . insulin aspart  0-5 Units Subcutaneous QHS  . insulin glargine  5 Units Subcutaneous QHS  . metoCLOPramide  10 mg Oral TID AC  . [START ON 04/01/2019] midodrine  5 mg Oral 3 times per day on Mon Wed Fri  . multivitamin  1 tablet Oral QHS  . sodium chloride flush  10-40 mL Intracatheter Q12H  . Warfarin - Pharmacist Dosing Inpatient   Does not apply q1800    Infusions: . ceFEPime (MAXIPIME) IV 1 g (03/30/19 1700)  . vancomycin Stopped (03/29/19 1318)  . vasopressin (PITRESSIN) infusion - *FOR SHOCK* Stopped (03/29/19 1643)    PRN Medications: acetaminophen, docusate sodium, fentaNYL (SUBLIMAZE) injection, hydrALAZINE, LORazepam, ondansetron (ZOFRAN) IV, oxyCODONE, promethazine **OR** promethazine **OR** promethazine, sodium chloride flush, traMADol    Assessment/Plan:    1. Right foot pain/PAD:  Peripheral angiogram 7/13 with 80% right SFA stenosis and occluded right PT.  He had stenting of SFA and PTCA of PT with restoration of flow.  However, he still needed amputation right 4th toe on 7/16 with intractable pain.  He was readmitted with pain in his right foot, nausea, and vomiting.  Pain somewhat better now but remaining toes on right foot are dark at the tips concerning for ischemia.  ESR 70, concern for osteomyelitis at right 4th metatarsal head.  WBCs elevated but afebrile.  - Will need to discuss Monday with Dr. Donzetta Matters => may need wider amputation on right foot.   - Will ask ID to see for osteomyelitis.  He is on vancomycin/cefepime currently.   2. Chronic systolic CHF: Ischemic cardiomyopathy. St Jude ICD. NSTEMI in March 2018 with DES to LAD and RCA,  complicated by cardiogenic shock, low output requiring milrinone. Echo 8/18 with EF 20-25%, moderate MR. CPX (8/18) with severe functional impairment due to HF. He is s/p HM3 LVAD placement. He had pump thrombosis 6/19 due to [facility] outflow graft kinking. He had pump exchange for another HM3. INR 1.9 today.  - Volume managed at dialysis.  - Continue ASA 81 and warfarin with INR goal 2-2.5.      - He has been referred to [facility] for evaluation for heart/kidney transplantand  recently saw Dr Mosetta Pigeon. 3. ESRD: Developed peri-operatively after HM3 placement. 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  -He is currently looking for a living donor. 4. CAD: NSTEMI in 3/18. LHC with 99% ulcerated lesion proximal RCA with left to right collaterals, 95% mid LAD stenosis after mid LAD stent. s/p PCI to RCA and LAD on 11/25/16.  -Continue statin and ASA. 4. h/o DVTs: Factor V Leiden heterozygote. -Anticoagulated. 5. Diabetic gastroparesis: Difficult to manage. He has seen gastroparesis specialist at [facility]. Surprisingly, gastric emptying study was normal. However, electrogastrogram showed poor gastric accomodation/capacity. Therefore, small frequent meals recommended.Had nausea/vomiting this admission, narcotics likely making this worse with constipation. He is feeling better and eating, has had BM.  - Continue reglan 10 mg tid/ac and Marinol.  - Continue Protonix bid.  - He will continue erythromycinindefinitely.  6. NM:MHWKG with SSI and home glargine. 7. HTN:MAP has been lower with dialysis, now off Bidil and getting midodrine prior to HD. 8. Atrial flutter: Paroxysmal. He has been in NSR recently.  - Continue amiodarone. 9. Left hand tingling and grip weakness: Present since he developed left arm hematoma from failed AV graft. 10. ID: WBCs up but no fever.  As above, right floot plain film concerning for osteomyelitis right  4th metatarsal head.  ESR 70.  - Continue cefepime/vancomycin, will ask ID to see.   I reviewed the LVAD parameters from today, and compared the results to the patient's prior recorded data.  No programming changes were made.  The LVAD is functioning within specified parameters.  The patient performs LVAD self-test daily.  LVAD interrogation was + 3 low flows. LVAD equipment check completed and is in good working order.  Back-up equipment present.   LVAD education done on emergency procedures and precautions and reviewed exit site care.  Length of Stay: [place], MD 03/31/2019, 8:43 AM  VAD Team --- VAD ISSUES ONLY--- Pager [phone] (7am - 7am)  Advanced Heart Failure Team  Pager [phone] (M-F; 7a - 4p)  Please contact [facility] for night-coverage after hours (4p -7a ) and weekends on [email or web address]

## 2019-04-01 ENCOUNTER — Inpatient Hospital Stay (HOSPITAL_COMMUNITY): Payer: BC Managed Care – PPO

## 2019-04-01 DIAGNOSIS — M79671 Pain in right foot: Secondary | ICD-10-CM

## 2019-04-01 DIAGNOSIS — M545 Low back pain: Secondary | ICD-10-CM

## 2019-04-01 LAB — GLUCOSE, CAPILLARY
Glucose-Capillary: 155 mg/dL — ABNORMAL HIGH (ref 70–99)
Glucose-Capillary: 205 mg/dL — ABNORMAL HIGH (ref 70–99)
Glucose-Capillary: 175 mg/dL — ABNORMAL HIGH (ref 70–99)
Glucose-Capillary: 136 mg/dL — ABNORMAL HIGH (ref 70–99)

## 2019-04-01 LAB — CBC
HCT: 23.9 % — ABNORMAL LOW (ref 39.0–52.0)
HCT: 25.9 % — ABNORMAL LOW (ref 39.0–52.0)
Hemoglobin: 7.8 g/dL — ABNORMAL LOW (ref 13.0–17.0)
Hemoglobin: 8.5 g/dL — ABNORMAL LOW (ref 13.0–17.0)
MCH: 31 pg (ref 26.0–34.0)
MCH: 30.6 pg (ref 26.0–34.0)
MCHC: 32.6 g/dL (ref 30.0–36.0)
MCHC: 32.8 g/dL (ref 30.0–36.0)
MCV: 94.8 fL (ref 80.0–100.0)
MCV: 93.2 fL (ref 80.0–100.0)
Platelets: 259 10*3/uL (ref 150–400)
Platelets: 238 10*3/uL (ref 150–400)
RBC: 2.52 MIL/uL — ABNORMAL LOW (ref 4.22–5.81)
RBC: 2.78 MIL/uL — ABNORMAL LOW (ref 4.22–5.81)
RDW: 16 % — ABNORMAL HIGH (ref 11.5–15.5)
RDW: 15.7 % — ABNORMAL HIGH (ref 11.5–15.5)
WBC: 14.1 10*3/uL — ABNORMAL HIGH (ref 4.0–10.5)
WBC: 23.3 10*3/uL — ABNORMAL HIGH (ref 4.0–10.5)
nRBC: 1 % — ABNORMAL HIGH (ref 0.0–0.2)
nRBC: 0.7 % — ABNORMAL HIGH (ref 0.0–0.2)

## 2019-04-01 LAB — CULTURE, BLOOD (ROUTINE X 2)
Culture: NO GROWTH
Culture: NO GROWTH

## 2019-04-01 LAB — CBC WITH DIFFERENTIAL/PLATELET
Abs Immature Granulocytes: 0.56 10*3/uL — ABNORMAL HIGH (ref 0.00–0.07)
Basophils Absolute: 0 10*3/uL (ref 0.0–0.1)
Basophils Relative: 0 %
Eosinophils Absolute: 0.4 10*3/uL (ref 0.0–0.5)
Eosinophils Relative: 2 %
HCT: 25.3 % — ABNORMAL LOW (ref 39.0–52.0)
Hemoglobin: 8 g/dL — ABNORMAL LOW (ref 13.0–17.0)
Immature Granulocytes: 2 %
Lymphocytes Relative: 5 %
Lymphs Abs: 1.2 10*3/uL (ref 0.7–4.0)
MCH: 30.3 pg (ref 26.0–34.0)
MCHC: 31.6 g/dL (ref 30.0–36.0)
MCV: 95.8 fL (ref 80.0–100.0)
Monocytes Absolute: 1.3 10*3/uL — ABNORMAL HIGH (ref 0.1–1.0)
Monocytes Relative: 6 %
Neutro Abs: 19.9 10*3/uL — ABNORMAL HIGH (ref 1.7–7.7)
Neutrophils Relative %: 85 %
Platelets: 248 10*3/uL (ref 150–400)
RBC: 2.64 MIL/uL — ABNORMAL LOW (ref 4.22–5.81)
RDW: 15.9 % — ABNORMAL HIGH (ref 11.5–15.5)
WBC: 23.4 10*3/uL — ABNORMAL HIGH (ref 4.0–10.5)
nRBC: 0.6 % — ABNORMAL HIGH (ref 0.0–0.2)

## 2019-04-01 LAB — BASIC METABOLIC PANEL
Anion gap: 13 (ref 5–15)
BUN: 47 mg/dL — ABNORMAL HIGH (ref 6–20)
CO2: 27 mmol/L (ref 22–32)
Calcium: 8.4 mg/dL — ABNORMAL LOW (ref 8.9–10.3)
Chloride: 95 mmol/L — ABNORMAL LOW (ref 98–111)
Creatinine, Ser: 10.03 mg/dL — ABNORMAL HIGH (ref 0.61–1.24)
GFR calc Af Amer: 6 mL/min — ABNORMAL LOW (ref >60)
GFR calc non Af Amer: 5 mL/min — ABNORMAL LOW (ref >60)
Glucose, Bld: 186 mg/dL — ABNORMAL HIGH (ref 70–99)
Potassium: 4 mmol/L (ref 3.5–5.1)
Sodium: 135 mmol/L (ref 135–145)

## 2019-04-01 LAB — HEPARIN LEVEL (UNFRACTIONATED): Heparin Unfractionated: 0.21 IU/mL — ABNORMAL LOW (ref 0.30–0.70)

## 2019-04-01 LAB — LACTATE DEHYDROGENASE
LDH: 204 U/L — ABNORMAL HIGH (ref 98–192)
LDH: 246 U/L — ABNORMAL HIGH (ref 98–192)

## 2019-04-01 LAB — PROTIME-INR
INR: 1.6 — ABNORMAL HIGH (ref 0.8–1.2)
Prothrombin Time: 18.9 seconds — ABNORMAL HIGH (ref 11.4–15.2)

## 2019-04-01 MED ORDER — CALCITRIOL 0.5 MCG PO CAPS
ORAL_CAPSULE | ORAL | Status: AC
  Administered 2019-04-01: 0.5 ug via ORAL
  Filled 2019-04-01: qty 1

## 2019-04-01 MED ORDER — HEPARIN SODIUM (PORCINE) 1000 UNIT/ML IJ SOLN
INTRAMUSCULAR | Status: AC
  Administered 2019-04-01: 1000 [IU]
  Filled 2019-04-01: qty 4

## 2019-04-01 MED ORDER — FENTANYL CITRATE (PF) 100 MCG/2ML IJ SOLN
INTRAMUSCULAR | Status: AC
  Administered 2019-04-01: 14:00:00 50 ug
  Filled 2019-04-01: qty 2

## 2019-04-01 MED ORDER — WARFARIN SODIUM 7.5 MG PO TABS
7.5000 mg | ORAL_TABLET | Freq: Once | ORAL | Status: AC
  Administered 2019-04-01: 7.5 mg via ORAL
  Filled 2019-04-01: qty 1

## 2019-04-01 MED ORDER — HEPARIN (PORCINE) 25000 UT/250ML-% IV SOLN
1200.0000 [IU]/h | INTRAVENOUS | Status: DC
  Administered 2019-04-01: 1200 [IU]/h via INTRAVENOUS
  Filled 2019-04-01: qty 250

## 2019-04-01 MED ORDER — MIDODRINE HCL 5 MG PO TABS
ORAL_TABLET | ORAL | Status: AC
  Administered 2019-04-01: 5 mg via ORAL
  Filled 2019-04-01: qty 1

## 2019-04-01 MED ORDER — OXYCODONE HCL 5 MG PO TABS
ORAL_TABLET | ORAL | Status: AC
  Administered 2019-04-01: 5 mg via ORAL
  Filled 2019-04-01: qty 1

## 2019-04-01 MED ORDER — VANCOMYCIN HCL IN DEXTROSE 1-5 GM/200ML-% IV SOLN
INTRAVENOUS | Status: AC
  Administered 2019-04-01: 1000 mg via INTRAVENOUS
  Filled 2019-04-01: qty 200

## 2019-04-01 MED ORDER — HEPARIN (PORCINE) 25000 UT/250ML-% IV SOLN
1450.0000 [IU]/h | INTRAVENOUS | Status: DC
  Administered 2019-04-01: 1000 [IU]/h via INTRAVENOUS
  Administered 2019-04-02: 1250 [IU]/h via INTRAVENOUS
  Administered 2019-04-03 – 2019-04-04 (×2): 1450 [IU]/h via INTRAVENOUS
  Filled 2019-04-01 (×3): qty 250

## 2019-04-01 MED ORDER — LORAZEPAM 2 MG/ML IJ SOLN
INTRAMUSCULAR | Status: AC
  Administered 2019-04-01: 0.5 mg via INTRAVENOUS
  Filled 2019-04-01: qty 1

## 2019-04-01 MED ORDER — FENTANYL CITRATE (PF) 100 MCG/2ML IJ SOLN
INTRAMUSCULAR | Status: AC
  Administered 2019-04-01: 25 ug via INTRAVENOUS
  Filled 2019-04-01: qty 2

## 2019-04-01 MED ORDER — LIDOCAINE 5 % EX PTCH
1.0000 | MEDICATED_PATCH | CUTANEOUS | Status: DC
  Administered 2019-04-01 – 2019-04-03 (×3): 1 via TRANSDERMAL
  Filled 2019-04-01 (×3): qty 1

## 2019-04-01 NOTE — Progress Notes (Signed)
[name]  Date of Admission:  03/27/2019     Total days of antibiotics 5         ASSESSMENT/PLAN  [name] is a 53 year old male admitted with nausea and vomiting and right foot pain and noted to have new cortical indistinctness of the 4th metatarsal suspicious for osteomyelitis. Nausea and vomiting improved, however has developed new onset right sided back / flank pain this morning. On broad spectrum antimicrobial therapy with vancomycin and cefepime for questionable osteomyelitis. Sedimentation rate of 70.   Indistinct 4th metatarsal changes - Question osteomyelitis or progressive ischemic foot changes. Vascular surgery recommended continued observation at this time, although likely to need below knee amputation in the future. Will continue current Vancomycin and cefepime for now.   Acute onset back pain - Unclear origin. Received pain medications from nursing staff a short time ago. Blood cultures finalized without growth. Would consider additional imaging to rule out discitis, although unlikely.   Nausea / Vomiting - Improved. Continue management per primary team.    Active Problems:   Nausea and vomiting in adult   Gastroparesis   ESRD on dialysis ([place])   Chronic systolic CHF (congestive heart failure) ([place])   LVAD (left ventricular assist device) present (HCC)   Nausea & vomiting   Ischemic foot   . aspirin EC  81 mg Oral Daily  . calcitRIOL  0.5 mcg Oral Q M,W,F  . Chlorhexidine Gluconate Cloth  6 each Topical Daily  . Chlorhexidine Gluconate Cloth  6 each Topical Q0600  . Chlorhexidine Gluconate Cloth  6 each Topical Q0600  . dronabinol  2.5 mg Oral BID AC  . erythromycin  250 mg Oral TID AC  . feeding supplement (ENSURE ENLIVE)  237 mL Oral TID BM  . gabapentin  100 mg Oral TID  . insulin aspart  0-15 Units Subcutaneous TID WC  . insulin aspart  0-5 Units Subcutaneous QHS  . insulin glargine  5 Units Subcutaneous QHS  .  metoCLOPramide  10 mg Oral TID AC  . midodrine  5 mg Oral 3 times per day on Mon Wed Fri  . multivitamin  1 tablet Oral QHS  . sodium chloride flush  10-40 mL Intracatheter Q12H  . warfarin  7.5 mg Oral ONCE-1800  . Warfarin - Pharmacist Dosing Inpatient   Does not apply q1800    SUBJECTIVE:  Afebrile overnight. Recently returned from dialysis with new onset back / flank pain located on his right side.   Allergies  Allergen Reactions  . Metformin And Related Diarrhea     Review of Systems: Review of Systems  Constitutional: Negative for chills, fever and weight loss.  Respiratory: Negative for cough, shortness of breath and wheezing.   Cardiovascular: Negative for chest pain and leg swelling.  Gastrointestinal: Negative for abdominal pain, constipation, diarrhea, nausea and vomiting.  Musculoskeletal: Positive for back pain.  Skin: Negative for rash.      OBJECTIVE: Vitals:   04/01/19 0930 04/01/19 1000 04/01/19 1030 04/01/19 1100  BP: (!) 99/59 (!) 107/59 108/63 117/74  Pulse: 86 87 73 81  Resp:      Temp:      TempSrc:      SpO2:      Weight:      Height:       Body mass index is 30.79 kg/m.  Physical Exam Constitutional:      General: He is not in acute distress.    Appearance:  He is well-developed.  Cardiovascular:     Comments: LVAD hum present.  Pulmonary:     Effort: Pulmonary effort is normal.     Breath sounds: Normal breath sounds.  Musculoskeletal:     Comments: Low back with no obvious deformity, discoloration or edema.  Skin:    General: Skin is warm and dry.  Neurological:     Mental Status: He is alert and oriented to person, place, and time.  Psychiatric:        Mood and Affect: Mood normal.     Lab Results Lab Results  Component Value Date   WBC 14.1 (H) 04/01/2019   HGB 7.8 (L) 04/01/2019   HCT 23.9 (L) 04/01/2019   MCV 94.8 04/01/2019   PLT 259 04/01/2019    Lab Results  Component Value Date   CREATININE 10.03 (H)  04/01/2019   BUN 47 (H) 04/01/2019   NA 135 04/01/2019   K 4.0 04/01/2019   CL 95 (L) 04/01/2019   CO2 27 04/01/2019    Lab Results  Component Value Date   ALT 9 03/27/2019   AST 28 03/27/2019   ALKPHOS 105 03/27/2019   BILITOT 0.7 03/27/2019     Microbiology: Recent Results (from the past 240 hour(s))  SARS Coronavirus 2 (CEPHEID- Performed in [facility] hospital lab), Hosp Order     Status: None   Collection Time: 03/27/19  4:20 PM   Specimen: Nasopharyngeal Swab  Result Value Ref Range Status   SARS Coronavirus 2 NEGATIVE NEGATIVE Final    Comment: (NOTE) If result is NEGATIVE SARS-CoV-2 target nucleic acids are NOT DETECTED. The SARS-CoV-2 RNA is generally detectable in upper and lower  respiratory specimens during the acute phase of infection. The lowest  concentration of SARS-CoV-2 viral copies this assay can detect is 250  copies / mL. A negative result does not preclude SARS-CoV-2 infection  and should not be used as the sole basis for treatment or other  patient management decisions.  A negative result may occur with  improper specimen collection / handling, submission of specimen other  than nasopharyngeal swab, presence of viral mutation(s) within the  areas targeted by this assay, and inadequate number of viral copies  (<250 copies / mL). A negative result must be combined with clinical  observations, patient history, and epidemiological information. If result is POSITIVE SARS-CoV-2 target nucleic acids are DETECTED. The SARS-CoV-2 RNA is generally detectable in upper and lower  respiratory specimens dur ing the acute phase of infection.  Positive  results are indicative of active infection with SARS-CoV-2.  Clinical  correlation with patient history and other diagnostic information is  necessary to determine patient infection status.  Positive results do  not rule out bacterial infection or co-infection with other viruses. If result is PRESUMPTIVE POSTIVE  SARS-CoV-2 nucleic acids MAY BE PRESENT.   A presumptive positive result was obtained on the submitted specimen  and confirmed on repeat testing.  While 2019 novel coronavirus  (SARS-CoV-2) nucleic acids may be present in the submitted sample  additional confirmatory testing may be necessary for epidemiological  and / or clinical management purposes  to differentiate between  SARS-CoV-2 and other Sarbecovirus currently known to infect humans.  If clinically indicated additional testing with an alternate test  methodology [phone]) is advised. The SARS-CoV-2 RNA is generally  detectable in upper and lower respiratory sp ecimens during the acute  phase of infection. The expected result is Negative. Fact Sheet for Patients:  StrictlyIdeas.no Fact Sheet  for Healthcare Providers: BankingDealers.co.za This test is not yet approved or cleared by the [place] and has been authorized for detection and/or diagnosis of SARS-CoV-2 by FDA under an Emergency Use Authorization (EUA).  This EUA will remain in effect (meaning this test can be used) for the duration of the COVID-19 declaration under Section 564(b)(1) of the Act, 21 U.S.C. section 360bbb-3(b)(1), unless the authorization is terminated or revoked sooner. Performed at [facility], [place] [address], [place], [place] [address]   Culture, blood (routine x 2)     Status: None   Collection Time: 03/27/19  4:40 PM   Specimen: BLOOD  Result Value Ref Range Status   Specimen Description BLOOD WRIST RIGHT  Final   Special Requests   Final    BOTTLES DRAWN AEROBIC AND ANAEROBIC Blood Culture results may not be optimal due to an inadequate volume of blood received in culture bottles   Culture   Final    NO GROWTH 5 DAYS Performed at [facility], [place] [address], [place], [place] [address]    Report Status 04/01/2019 FINAL  Final  Culture, blood (routine x 2)     Status: None    Collection Time: 03/27/19  4:47 PM   Specimen: BLOOD  Result Value Ref Range Status   Specimen Description BLOOD LEFT WRIST  Final   Special Requests   Final    BOTTLES DRAWN AEROBIC ONLY Blood Culture results may not be optimal due to an inadequate volume of blood received in culture bottles   Culture   Final    NO GROWTH 5 DAYS Performed at [facility], [place] [address] [place], [place] [address]    Report Status 04/01/2019 FINAL  Final  MRSA PCR Screening     Status: None   Collection Time: 03/28/19  2:08 AM   Specimen: Nasopharyngeal  Result Value Ref Range Status   MRSA by PCR NEGATIVE NEGATIVE Final    Comment:        The GeneXpert MRSA Assay (FDA approved for NASAL specimens only), is one component of a comprehensive MRSA colonization surveillance program. It is not intended to diagnose MRSA infection nor to guide or monitor treatment for MRSA infections. Performed at [facility], [place] [address], [place], [place] [address]      Terri Piedra, [facility] [phone] Pager  04/01/2019  1:31 PM

## 2019-04-01 NOTE — Progress Notes (Signed)
Heparin IV started in HD. Heparin started at 12 mL/hr. Lucile Crater, RN

## 2019-04-01 NOTE — Progress Notes (Signed)
[facility] Progress Note  Subjective:  Seen prior to HD.  Feels well today and states HD has been going well.  Last HD on 7/24 with no UF.    Review of systems:  Denies chest pain or shortness of breath; denies nausea or vomiting   Vitals:   03/31/19 2000 03/31/19 2345 04/01/19 0500 04/01/19 0621  BP: 105/82 107/78 98/85   Pulse: 76 79    Resp: 10 14    Temp: 97.7 F (36.5 C) (!) 97.5 F (36.4 C) 98.3 F (36.8 C)   TempSrc: Oral Oral Oral   SpO2: 99% 97%    Weight:    89.2 kg  Height:        Inpatient medications: . aspirin EC  81 mg Oral Daily  . calcitRIOL  0.5 mcg Oral Q M,W,F  . Chlorhexidine Gluconate Cloth  6 each Topical Daily  . Chlorhexidine Gluconate Cloth  6 each Topical Q0600  . Chlorhexidine Gluconate Cloth  6 each Topical Q0600  . dronabinol  2.5 mg Oral BID AC  . erythromycin  250 mg Oral TID AC  . feeding supplement (ENSURE ENLIVE)  237 mL Oral TID BM  . gabapentin  100 mg Oral TID  . insulin aspart  0-15 Units Subcutaneous TID WC  . insulin aspart  0-5 Units Subcutaneous QHS  . insulin glargine  5 Units Subcutaneous QHS  . metoCLOPramide  10 mg Oral TID AC  . midodrine  5 mg Oral 3 times per day on Mon Wed Fri  . multivitamin  1 tablet Oral QHS  . sodium chloride flush  10-40 mL Intracatheter Q12H  . Warfarin - Pharmacist Dosing Inpatient   Does not apply q1800   . ceFEPime (MAXIPIME) IV 1 g (03/31/19 1557)  . vancomycin Stopped (03/29/19 1318)   acetaminophen, docusate sodium, fentaNYL (SUBLIMAZE) injection, hydrALAZINE, LORazepam, ondansetron (ZOFRAN) IV, oxyCODONE, promethazine **OR** promethazine **OR** promethazine, sodium chloride flush, traMADol    Exam: Gen alert, no distress No jvd or bruits Chest clear bilat to bases Cor mechanical heart sounds Abd soft ntnd no mass or ascites +bs, hardware MS R foot w 4th toe amp, skin darkening in several areas of the foot Neuro alert and oriented x 3; conversant Access: left chest  tunneled dialysis catheter    Home meds:  - aspirin 81/ atorvastatin 40 qd  - metoclopramide 5 tid/ erythromycin 250 tid/ dronabinol 2.5 bid  - warfarin daily  - tramadol qid prn/ citalopram 20 qd/ oxy IR  33m prn/ gabapentin 100 bid  - Novolog 7u hs/ insulin glargine 10u qd  - midodrine 5 mg pre HD tiw  - prn's/ vitamins/ supplements    Outpt HD: [facility] MWF  4h 161m   84kg  400/800   2K/3Ca bath  TDC   Hep none  - mircera 200uig last 7/22  - venofer 50/w  - hect 2 ug tiw   Assessment/ Plan: 1. Abd pain /nausea/ vomiting - h/o gastroparesis on mult meds 2. R foot ischemia - sp perc revasc RLE and R 4th toe amp (7/13, 7/16). Has some discoloration of the R foot. VVS following 3. Anemia ABL/ CKD: sp 1u prbc; on outpatient mircera and last received 200 ug on 7/22 4. ESRD: on HD MWF schedule 5. Hypotension/ volume: midodrine ^'d 5 tid per cards. No vol^ on exam 6. CHF/ severe ICM: s/p LVAD 2019 7. Chronic gastroparesis - supportive care per primary team  8. DM2 - per primary team  Iron/TIBC/Ferritin/ %Sat    Component Value Date/Time   IRON 40 (L) 11/02/2017 0016   TIBC 188 (L) 11/02/2017 0016   FERRITIN 523 (H) 11/02/2017 0016   IRONPCTSAT 21 11/02/2017 0016   Recent Labs  Lab 03/27/19 1351  04/01/19 0246  NA 135   < > 135  K 4.0   < > 4.0  CL 95*   < > 95*  CO2 23   < > 27  GLUCOSE 114*   < > 186*  BUN 14   < > 47*  CREATININE 4.66*   < > 10.03*  CALCIUM 9.8   < > 8.4*  ALBUMIN 3.1*  --   --   INR 3.8*   < > 1.6*   < > = values in this interval not displayed.   Recent Labs  Lab 03/27/19 1351  AST 28  ALT 9  ALKPHOS 105  BILITOT 0.7  PROT 8.3*   Recent Labs  Lab 04/01/19 0246  WBC 14.1*  HGB 7.8*  HCT 23.9*  PLT 259    [name] 04/01/2019 7:34 AM

## 2019-04-01 NOTE — Progress Notes (Signed)
[name] for heparin Indication: LVAD  Allergies  Allergen Reactions  . Metformin And Related Diarrhea    Patient Measurements: Height: 5' 7.01" (170.2 cm) Weight: 193 lb 5.5 oz (87.7 kg) IBW/kg (Calculated) : 66.12 Heparin Dosing Weight: 80kg  Vital Signs: Temp: 98.5 F (36.9 C) (07/27 1111) Temp Source: Oral (07/27 1111) BP: 121/65 (07/27 1111) Pulse Rate: 80 (07/27 1111)  Labs: Recent Labs    03/30/19 0316 03/31/19 0325 04/01/19 0246 04/01/19 1324  HGB 8.5* 8.2* 7.8*  --   HCT 26.4* 25.5* 23.9*  --   PLT 267 263 259  --   LABPROT 29.4* 21.1* 18.9*  --   INR 2.8* 1.9* 1.6*  --   HEPARINUNFRC  --   --   --  0.21*  CREATININE 5.73* 8.16* 10.03*  --     Estimated Creatinine Clearance: 8.9 mL/min (A) (by C-G formula based on SCr of 10.03 mg/dL (H)).   Medical History: Past Medical History:  Diagnosis Date  . Angina   . ASCVD (arteriosclerotic cardiovascular disease)    , Anterior infarction 2005, LAD diagonal bifurcation intervention 03/2004  . Automatic implantable cardiac defibrillator -[employer] Jude's       . Benign neoplasm of colon   . CHF (congestive heart failure) ([place])   . Chronic systolic heart failure ([place])   . Coronary artery disease     Widely patent previously placed stents in the left anterior   . Crohn's disease ([place])   . Deep venous thrombosis (HCC)    Recurrent-on Coumadin  . Dialysis patient ([place])   . Dyspnea   . Gastroparesis   . GERD (gastroesophageal reflux disease)   . High cholesterol   . Hyperlipidemia   . Hypersomnolent    Previous diagnosis of narcolepsy  . Hypertension, essential   . Ischemic cardiomyopathy    Ejection fraction 15-20% catheterization 2010  . Type II or unspecified type diabetes mellitus without mention of complication, not stated as uncontrolled   . Unspecified gastritis and gastroduodenitis without mention of hemorrhage     Medications:  Medications Prior to Admission   Medication Sig Dispense Refill Last Dose  . acetaminophen (TYLENOL) 325 MG tablet Take 2 tablets (650 mg total) by mouth every 4 (four) hours as needed for headache or mild pain.   03/25/2019 at prn  . aspirin EC 81 MG tablet Take 1 tablet (81 mg total) by mouth daily. 90 tablet 3 03/25/2019  . docusate sodium (COLACE) 100 MG capsule Take 1 capsule (100 mg total) by mouth daily as needed for mild constipation. 10 capsule 0 03/25/2019  . gabapentin (NEURONTIN) 100 MG capsule Take 1 capsule (100 mg total) by mouth 2 (two) times daily. (Patient taking differently: Take 100 mg by mouth 3 (three) times daily. ) 60 capsule 3 03/25/2019  . insulin glargine (LANTUS) 100 UNIT/ML injection Inject 0.1 mLs (10 Units total) into the skin daily. (Patient taking differently: Inject 5 Units into the skin daily. ) 10 mL 5 03/26/2019 at Unknown time  . midodrine (PROAMATINE) 5 MG tablet Take 1 tablet (5 mg total) by mouth daily. Take 1 tablet before dialysis treatment 30 tablet 6 03/25/2019  . multivitamin (RENA-VIT) TABS tablet Take 1 tablet by mouth daily.   03/23/2019  . NOVOLOG 100 UNIT/ML injection INJECT 7 UNITS INTO THE SKIN DAILY WITH SUPPER. (Patient taking differently: Inject 7 Units into the skin daily with supper. ) 10 mL 3 03/26/2019 at Unknown time  . oxyCODONE (  ROXICODONE) 5 MG immediate release tablet Take 1-2 tablets every 4-6 hours for severe pain. 15 tablet 0 03/25/2019  . promethazine (PHENERGAN) 12.5 MG tablet Take 1 tablet (12.5 mg total) by mouth every 6 (six) hours as needed for nausea or vomiting. 30 tablet 3 03/27/2019 at prn  . warfarin (COUMADIN) 5 MG tablet TAKE AS FOLLOWS: MON-WED-FRI 1&1/2 TABLETS DAILY. ON ALL OTHER DAYS 1 TABLET (Patient taking differently: Take 5-705 mg by mouth one time only at 6 PM. 7.5 mg  Take Tuesday and Thursday 5 mg all the other days) 50 tablet 6 03/25/2019  . atorvastatin (LIPITOR) 40 MG tablet Take 1 tablet (40 mg total) by mouth daily at 6 PM. (Patient not taking:  Reported on 03/27/2019) 30 tablet 6 Not Taking at Unknown time  . calcitRIOL (ROCALTROL) 0.5 MCG capsule Take 1 capsule (0.5 mcg total) by mouth every Monday, Wednesday, and Friday. (Patient not taking: Reported on 03/27/2019) 15 capsule 5 03/25/2019  . citalopram (CELEXA) 20 MG tablet Take 1 tablet (20 mg total) by mouth daily. (Patient not taking: Reported on 03/27/2019) 30 tablet 6 Not Taking at Unknown time  . dronabinol (MARINOL) 2.5 MG capsule Take 1 capsule (2.5 mg total) by mouth 2 (two) times daily before lunch and supper. (Patient not taking: Reported on 03/27/2019) 60 capsule 5 03/25/2019  . erythromycin (E-MYCIN) 250 MG tablet Take 1 tablet (250 mg total) by mouth 3 (three) times daily. (Patient not taking: Reported on 03/27/2019) 90 tablet 4 Not Taking at Unknown time  . LORazepam (ATIVAN) 0.5 MG tablet Take 1 tablet (0.5 mg total) by mouth every 12 (twelve) hours as needed (nausea). (Patient not taking: Reported on 03/27/2019) 60 tablet 2 Not Taking at prn  . metoCLOPramide (REGLAN) 10 MG tablet Take 1 tablet (10 mg total) by mouth 3 (three) times daily. (Patient not taking: Reported on 03/27/2019) 90 tablet 6 Not Taking at Unknown time  . traMADol (ULTRAM) 50 MG tablet Take 1 tablet (50 mg total) by mouth every 6 (six) hours as needed for moderate pain. (Patient not taking: Reported on 03/27/2019) 30 tablet 0 Not Taking at Unknown time   Scheduled:  . aspirin EC  81 mg Oral Daily  . calcitRIOL  0.5 mcg Oral Q M,W,F  . Chlorhexidine Gluconate Cloth  6 each Topical Daily  . Chlorhexidine Gluconate Cloth  6 each Topical Q0600  . Chlorhexidine Gluconate Cloth  6 each Topical Q0600  . dronabinol  2.5 mg Oral BID AC  . erythromycin  250 mg Oral TID AC  . feeding supplement (ENSURE ENLIVE)  237 mL Oral TID BM  . gabapentin  100 mg Oral TID  . insulin aspart  0-15 Units Subcutaneous TID WC  . insulin aspart  0-5 Units Subcutaneous QHS  . insulin glargine  5 Units Subcutaneous QHS  . metoCLOPramide   10 mg Oral TID AC  . midodrine  5 mg Oral 3 times per day on Mon Wed Fri  . multivitamin  1 tablet Oral QHS  . sodium chloride flush  10-40 mL Intracatheter Q12H  . warfarin  7.5 mg Oral ONCE-1800  . Warfarin - Pharmacist Dosing Inpatient   Does not apply q1800   Infusions:  . ceFEPime (MAXIPIME) IV 1 g (03/31/19 1557)  . heparin Stopped (04/01/19 1600)  . vancomycin Stopped (04/01/19 1355)    Assessment: 53yo male on Coumadin for LVAD, admitted for foot pain/PAD, now w/ INR below goal, to start heparin bridge.  Was started on heparin  1200 units/hr - patient started having acute, severe pain. CT ordered to rule out retroperitoneal bleed - found to be negative. Heparin level drawn ~6.5 hours after start came back subtherapeutic at 0.21.    Goal of Therapy:  Heparin level 0.3-0.7 units/ml Monitor platelets by anticoagulation protocol: Yes   Plan:  Given INR 1.6 and afternoon's events, will reduce heparin infusion to 1000 units/hr Will monitor level with morning labs Monitor daily HL, CBC, and for s/sx of bleeding  Antonietta Jewel, PharmD, [place] Clinical Pharmacist  Pager: [phone] Phone: [phone] 04/01/2019,2:52 PM

## 2019-04-01 NOTE — Progress Notes (Signed)
[place] for warfarin Indication: LVAD  Allergies  Allergen Reactions  . Metformin And Related Diarrhea    Patient Measurements: Wt: 85.4 kg Ht: 5'7"  Vital Signs: Temp: 98.3 F (36.8 C) (07/27 0500) Temp Source: Oral (07/27 0500) BP: 98/85 (07/27 0500) Pulse Rate: 79 (07/26 2345)  Labs: Recent Labs    03/30/19 0316 03/31/19 0325 04/01/19 0246  HGB 8.5* 8.2* 7.8*  HCT 26.4* 25.5* 23.9*  PLT 267 263 259  LABPROT 29.4* 21.1* 18.9*  INR 2.8* 1.9* 1.6*  CREATININE 5.73* 8.16* 10.03*    Estimated Creatinine Clearance: 9 mL/min (A) (by C-G formula based on SCr of 10.03 mg/dL (H)).   Medical History: Past Medical History:  Diagnosis Date  . Angina   . ASCVD (arteriosclerotic cardiovascular disease)    , Anterior infarction 2005, LAD diagonal bifurcation intervention 03/2004  . Automatic implantable cardiac defibrillator -[employer]. Jude's       . Benign neoplasm of colon   . CHF (congestive heart failure) ([place])   . Chronic systolic heart failure ([place])   . Coronary artery disease     Widely patent previously placed stents in the left anterior   . Crohn's disease ([place])   . Deep venous thrombosis (HCC)    Recurrent-on Coumadin  . Dialysis patient ([place])   . Dyspnea   . Gastroparesis   . GERD (gastroesophageal reflux disease)   . High cholesterol   . Hyperlipidemia   . Hypersomnolent    Previous diagnosis of narcolepsy  . Hypertension, essential   . Ischemic cardiomyopathy    Ejection fraction 15-20% catheterization 2010  . Type II or unspecified type diabetes mellitus without mention of complication, not stated as uncontrolled   . Unspecified gastritis and gastroduodenitis without mention of hemorrhage     Medications:  Scheduled:  . aspirin EC  81 mg Oral Daily  . calcitRIOL  0.5 mcg Oral Q M,W,F  . Chlorhexidine Gluconate Cloth  6 each Topical Daily  . Chlorhexidine Gluconate Cloth  6 each Topical Q0600  . Chlorhexidine  Gluconate Cloth  6 each Topical Q0600  . dronabinol  2.5 mg Oral BID AC  . erythromycin  250 mg Oral TID AC  . feeding supplement (ENSURE ENLIVE)  237 mL Oral TID BM  . gabapentin  100 mg Oral TID  . insulin aspart  0-15 Units Subcutaneous TID WC  . insulin aspart  0-5 Units Subcutaneous QHS  . insulin glargine  5 Units Subcutaneous QHS  . metoCLOPramide  10 mg Oral TID AC  . midodrine  5 mg Oral 3 times per day on Mon Wed Fri  . multivitamin  1 tablet Oral QHS  . sodium chloride flush  10-40 mL Intracatheter Q12H  . Warfarin - Pharmacist Dosing Inpatient   Does not apply q1800    Assessment: [name] presenting with N/V and abdominal pain. On warfarin PTA for LVAD/Factor 5 Leiden. PTA regimen is 5 mg daily except 7.5 mg on Tuesday/Thursday. Last dose was on 7/20.  INR today continues down trend from 4.4>2.8>1.9 - now at 1.6 (had been held for 5 days prior to restart 7/25). Hgb drifting slightly to 7.8 after 1 PRBC 7/23 and 7/24. LDH stable at 204. No s/sx of bleeding noted. Starting heparin given low INR today.  Diet seems to be improving - has been receiving ensure. Reinvolving vascular to discuss wider amputation so will try and not be overly aggressive with warfarin dosing.    Goal of Therapy:  INR 2-3 Monitor platelets by anticoagulation protocol: Yes   Plan:  Will give 7.5 mg tonight Continue daily INR   Antonietta Jewel, PharmD, [place] Clinical Pharmacist  Pager: [phone] Phone: [phone] 04/01/2019 7:19 AM

## 2019-04-01 NOTE — Progress Notes (Signed)
Patient ID: [name], male   DOB: September 20, 1965, 53 y.o.   MRN: [id number]   Advanced Heart Failure VAD Team Note  PCP-Cardiologist: No primary care provider on file.   Subjective:    Admitted with intractable N/V and R foot pain.   Nausea better, eating and has had a bowel movement.  Foot pain better.   WBCs 20 => 17 => 14, afebrile.  ESR 70.  Right foot film with possible osteomyelitis 4th metatarsal head.   Walked in hall with walker, doing better with mobility.  Seen today at HD.   LVAD INTERROGATION:  HeartMate 3 LVAD:   Flow 3.5 liters/min, speed 5500 , power 4.6, PI 5.6.   Objective:    Vital Signs:   Temp:  [97.5 F (36.4 C)-98.6 F (37 C)] 98.6 F (37 C) (07/27 0700) Pulse Rate:  [68-82] 76 (07/27 0800) Resp:  [10-20] 14 (07/26 2345) BP: (98-142)/(69-91) 131/83 (07/27 0800) SpO2:  [96 %-99 %] 97 % (07/26 2345) Weight:  [89.2 kg] 89.2 kg (07/27 0700) Last BM Date: 03/31/19 Mean arterial Pressure 80-90 Intake/Output:   Intake/Output Summary (Last 24 hours) at 04/01/2019 0912 Last data filed at 03/31/2019 2100 Gross per 24 hour  Intake 1037 ml  Output -  Net 1037 ml     Physical Exam   General: Well appearing this am. NAD.  HEENT: Normal. Neck: Supple, JVP 7-8 cm. Carotids OK.  Cardiac:  Mechanical heart sounds with LVAD hum present.  Lungs:  CTAB, normal effort.  Abdomen:  NT, ND, no HSM. No bruits or masses. +BS  LVAD exit site: Well-healed and incorporated. Dressing dry and intact. No erythema or drainage. Stabilization device present and accurately applied. Driveline dressing changed daily per sterile technique. Extremities:  No edema.  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  Tips of other toes on right foot are dark.   Neuro:  Alert & oriented x 3. Cranial nerves grossly intact. Moves all 4 extremities w/o difficulty. Affect pleasant      Telemetry   NSR 70-80s. Personally reviewed   EKG    N/a   Labs   Basic Metabolic Panel: Recent Labs   Lab 03/28/19 0318 03/29/19 0337 03/30/19 0316 03/31/19 0325 04/01/19 0246  NA 134* 133* 133* 137 135  K 4.7 4.2 3.7 3.7 4.0  CL 92* 99 96* 97* 95*  CO2 22 23 26 26 27   GLUCOSE 164* 195* 226* 176* 186*  BUN 25* 43* 26* 38* 47*  CREATININE 6.47* 8.43* 5.73* 8.16* 10.03*  CALCIUM 9.2 8.3* 8.3* 8.6* 8.4*    Liver Function Tests: Recent Labs  Lab 03/27/19 1351  AST 28  ALT 9  ALKPHOS 105  BILITOT 0.7  PROT 8.3*  ALBUMIN 3.1*   No results for input(s): LIPASE, AMYLASE in the last 168 hours. No results for input(s): AMMONIA in the last 168 hours.  CBC: Recent Labs  Lab 03/27/19 1351  03/28/19 2001 03/29/19 0337 03/30/19 0316 03/31/19 0325 04/01/19 0246  WBC 17.1*   < > 18.4* 18.9* 20.0* 17.0* 14.1*  NEUTROABS 14.9*  --   --   --   --   --   --   HGB 9.9*   < > 7.3* 7.2* 8.5* 8.2* 7.8*  HCT 31.5*   < > 22.3* 22.0* 26.4* 25.5* 23.9*  MCV 94.9   < > 93.7 92.4 93.6 95.1 94.8  PLT 322   < > 244 259 267 263 259   < > = values in this interval  not displayed.    INR: Recent Labs  Lab 03/28/19 0318 03/29/19 0337 03/30/19 0316 03/31/19 0325 04/01/19 0246  INR 4.0* 4.4* 2.8* 1.9* 1.6*    Other results:    Imaging   Dg Foot Complete Right  Result Date: 03/30/2019 CLINICAL DATA:  53 year old male with RIGHT foot pain. 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 EXAM: RIGHT FOOT COMPLETE - 3+ VIEW COMPARISON:  03/15/2019 FINDINGS: Interval amputation of the 4th digit is noted. Cortical indistinctness of the 4th metatarsal head is noted which may represent osteomyelitis. Heavy vascular calcifications are present. No other acute bony abnormalities are noted. IMPRESSION: Interval 4th digit amputation with new cortical indistinctness of the 4th metatarsal head suspicious for osteomyelitis. Electronically Signed   By: Margarette Canada M.D.   On: 03/30/2019 14:34     Medications:     Scheduled Medications: . aspirin EC  81 mg Oral Daily  . calcitRIOL  0.5 mcg Oral Q M,W,F  .  Chlorhexidine Gluconate Cloth  6 each Topical Daily  . Chlorhexidine Gluconate Cloth  6 each Topical Q0600  . Chlorhexidine Gluconate Cloth  6 each Topical Q0600  . dronabinol  2.5 mg Oral BID AC  . erythromycin  250 mg Oral TID AC  . feeding supplement (ENSURE ENLIVE)  237 mL Oral TID BM  . gabapentin  100 mg Oral TID  . insulin aspart  0-15 Units Subcutaneous TID WC  . insulin aspart  0-5 Units Subcutaneous QHS  . insulin glargine  5 Units Subcutaneous QHS  . metoCLOPramide  10 mg Oral TID AC  . midodrine  5 mg Oral 3 times per day on Mon Wed Fri  . multivitamin  1 tablet Oral QHS  . sodium chloride flush  10-40 mL Intracatheter Q12H  . Warfarin - Pharmacist Dosing Inpatient   Does not apply q1800    Infusions: . ceFEPime (MAXIPIME) IV 1 g (03/31/19 1557)  . heparin 1,200 Units/hr (04/01/19 0841)  . vancomycin Stopped (03/29/19 1318)    PRN Medications: acetaminophen, docusate sodium, fentaNYL (SUBLIMAZE) injection, hydrALAZINE, LORazepam, ondansetron (ZOFRAN) IV, oxyCODONE, promethazine **OR** promethazine **OR** promethazine, sodium chloride flush, traMADol    Assessment/Plan:    1. PAD:  Peripheral angiogram 7/13 with 80% right SFA stenosis and occluded right PT.  He had stenting of SFA and PTCA of PT with restoration of flow.  However, he still needed amputation right 4th toe on 7/16 with intractable pain.  He was readmitted with pain in his right foot, nausea, and vomiting.  Pain somewhat better now but remaining toes on right foot are dark at the tips concerning for ischemia.  He also has significant unrevascularized disease in left leg.   ESR 70, concern for osteomyelitis at right 4th metatarsal head => seen by ID and thought to be most likely reactive changes post-surgery.  WBCs elevated but afebrile.  Discussed with Dr. Donzetta Matters, suspect he will eventually need wider amputation in the future but does not seem emergent and can likely hold off for now.  - Will be seen today by Dr.  Donzetta Matters, if no further vascular procedure he probably can go home given improvement in pain and nausea.  - Will ask for further guidance from ID in terms of antibiotics at discharge.  2. Chronic systolic CHF: Ischemic cardiomyopathy. St Jude ICD. NSTEMI in March 2018 with DES to LAD and RCA, complicated by cardiogenic shock, low output requiring milrinone. Echo 8/18 with EF 20-25%, moderate MR. CPX (8/18) with severe functional impairment due to  HF. He is s/p HM3 LVAD placement. He had pump thrombosis 6/19 due to [facility] outflow graft kinking. He had pump exchange for another HM3. INR 1.9 today.  - Volume managed at dialysis.  - Continue ASA 81 and warfarin with INR goal 2-2.5.  INR 1.6 today, will start IV heparin.  - He has been referred to [facility] for evaluation for heart/kidney transplantand recently saw Dr Mosetta Pigeon => he may not be a candidate for transplant given extensive PAD.  3. ESRD: Developed peri-operatively after HM3 placement. 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  4. CAD: NSTEMI in 3/18. LHC with 99% ulcerated lesion proximal RCA with left to right collaterals, 95% mid LAD stenosis after mid LAD stent. s/p PCI to RCA and LAD on 11/25/16.  -Continue statin and ASA. 4. h/o DVTs: Factor V Leiden heterozygote. -Anticoagulated. 5. Diabetic gastroparesis: Difficult to manage. He has seen gastroparesis specialist at [facility]. Surprisingly, gastric emptying study was normal. However, electrogastrogram showed poor gastric accomodation/capacity. Therefore, small frequent meals recommended.Had nausea/vomiting this admission, narcotics likely making this worse with constipation. He is feeling better and eating, has had BM.  - Continue reglan 10 mg tid/ac and Marinol.  - Continue Protonix bid.  - He will continue erythromycinindefinitely.  6. OH:KGOVP with SSI and home glargine. 7. HTN:MAP has been lower with dialysis, now off  Bidil and getting midodrine prior to HD. 8. Atrial flutter: Paroxysmal. He has been in NSR recently.  - Continue amiodarone. 9. Left hand tingling and grip weakness: Present since he developed left arm hematoma from failed AV graft. 10. ID: WBCs up but no fever.  As above, right floot plain film concerning for osteomyelitis right 4th metatarsal head.  ESR 70. However, seen by ID and think most likely reactive post-surgery and less likely infectious.  - Continue cefepime/vancomycin pending further ID guidance.  If no further vascular procedures, he can go home when INR > 1.8, likely tomorrow.    I reviewed the LVAD parameters from today, and compared the results to the patient's prior recorded data.  No programming changes were made.  The LVAD is functioning within specified parameters.  The patient performs LVAD self-test daily.  LVAD interrogation was + 3 low flows. LVAD equipment check completed and is in good working order.  Back-up equipment present.   LVAD education done on emergency procedures and precautions and reviewed exit site care.  Length of Stay: [place], MD 04/01/2019, 9:12 AM  VAD Team --- VAD ISSUES ONLY--- Pager [phone] (7am - 7am)  Advanced Heart Failure Team  Pager [phone] (M-F; 7a - 4p)  Please contact [facility] for night-coverage after hours (4p -7a ) and weekends on [email or web address]

## 2019-04-01 NOTE — Progress Notes (Signed)
   Called by nursing staff due to acute back pain not responding to pain medications.   Moaning in the bed lower back pain 10/10.   Started on heparin drip earlier this morning. Discussed with Dr Aundra Dubin. Check stat CT without contrast now. Check CBC and heparin level now.   Amy Clegg NP-C  1:27 PM

## 2019-04-01 NOTE — Procedures (Signed)
Seen and examined on dialysis.  Procedure supervised.  Access working well.  Blood pressure 132/58 and HR 74.  Tolerating goal.    Claudia Desanctis, MD 04/01/2019  9:58 AM

## 2019-04-01 NOTE — Progress Notes (Signed)
Pt transported to dialysis without difficulty, pt on batteries, with backup batteries and controller at bedside, handoff report given to RN in diaylsis

## 2019-04-01 NOTE — Progress Notes (Signed)
Primary nurse Crashenda made aware of new order for heparin drip and will need to come to HD unit to initiate drip.

## 2019-04-01 NOTE — Progress Notes (Signed)
LVAD Coordinator Rounding Note:  Pt admitted per Dr. Haroldine Laws 03/27/19 for nausea, vomiting, and right foot pain.  HM III LVAD implanted on 06/12/17 by Dr. Prescott Gum under Destination Therapy criteria due to renal insufficiency. Pump exchange on 03/01/18 for outflow graft twist/obstruction.   Patient complaining of severe right flank pain. He is unable to lay in bed or sit still. Darrick Grinder NP at bedside. CT negative.   Vital signs: Temp: 98.5 HR:  90 Doppler Pressure: not done Auto BP: 121/65 (map not documented) O2 Sat: 100 % on RA Wt: 182.9>186.2>193.3 lbs  LVAD interrogation reveals:  Speed: 5500 Flow: 4.1 Power: 4.3w PI: 4.3 Alarms: none Events: none  Hematocrit: 29  Fixed speed: 5500 Low speed limit: 5200  Drive Line:  CDI. Weekly dressing kits.  Next dressing change due 04/03/19; bedside nurse may change dressing.   Labs: LDH trend: 181>159>204  INR trend: 4.0>4.4>1.6  WBC trend: 18.6>18.9>14.1  Infection:  Blood cx 03/27/19>> negative  Gtts: - Vasopressin >> started 03/28/19>>off 03/28/19  Anticoagulation Plan: - INR Goal: 2.0 - 3.0 - ASA Dose: 81 mg daily   Device: -St Jude dual  -Therapies: on 200 bpm - Monitor: on VT 150 bpm  Adverse Events on VAD: - ESRD post LVAD; started CVVH 06/15/17; HD started 06/20/17 - Hospitalization 11/5 - 07/16/17 > gastroparesis diagnosis after EGD - Hospitalization 11/26 - 08/04/17 > gastroparesis - Hospitalization 1/8 - 09/16/17>gastroparesis - referred to Dr. Corliss Parish at [facility] - 10/31/17>rightupper arm arteriovenous graft with Artegraft; ligation of right first stage brachial vein transposition  - 12/11/13 elective admission for thrombectomy for AV graft in RUA  - 12/20/17 > intractable N/V - 01/19/18 > intractable N/V sent by EMS from dialysis center - 03/01/18 > outflow graft twist/occulsion requiring pump exchange - 05/23/18 > admitted for non-working fistula in RUA - 06/10/18 > admitted for intractable N/V - 08/08/18 >  admitted for peri-op management for 2nd stage of AV fistula, found to be sclerotic. New LUE graft created from brachial artery to axillary vein. Developed spontaneous LUE hematoma.  - 08/22/18 > admitted for intractable N/V - 08/28/18> admitted for intractable N/V/D and low INR  Plan/Recommendations:  1. Please page VAD coordinator with any equipment questions or concerns. 2. Weekly VAD dressing changes - due 04/03/19 bedside nurse to perform.   [name] RN [place] Coordinator  Office: [phone]  24/7 Pager: [phone]

## 2019-04-01 NOTE — Progress Notes (Addendum)
Pt received from HD in uncontrollable pain with complaints of lower back pain with no relief. Patient symptomatic moaning rocking back and for the bed. Patient up and down at a bedside moaning. IV fentanyl given for pain. Alert and oriented x4. Heart failure team made aware. NP at bedside.

## 2019-04-01 NOTE — Progress Notes (Signed)
ANTICOAGULATION CONSULT NOTE - Initial Consult  Pharmacy Consult for heparin Indication: LVAD  Allergies  Allergen Reactions  . Metformin And Related Diarrhea    Patient Measurements: Height: 5' 7.01" (170.2 cm) Weight: 196 lb 10.4 oz (89.2 kg) IBW/kg (Calculated) : 66.12 Heparin Dosing Weight: 80kg  Vital Signs: Temp: 98.3 F (36.8 C) (07/27 0500) Temp Source: Oral (07/27 0500) BP: 98/85 (07/27 0500) Pulse Rate: 79 (07/26 2345)  Labs: Recent Labs    03/30/19 0316 03/31/19 0325 04/01/19 0246  HGB 8.5* 8.2* 7.8*  HCT 26.4* 25.5* 23.9*  PLT 267 263 259  LABPROT 29.4* 21.1* 18.9*  INR 2.8* 1.9* 1.6*  CREATININE 5.73* 8.16* 10.03*    Estimated Creatinine Clearance: 9 mL/min (A) (by C-G formula based on SCr of 10.03 mg/dL (H)).   Medical History: Past Medical History:  Diagnosis Date  . Angina   . ASCVD (arteriosclerotic cardiovascular disease)    , Anterior infarction 2005, LAD diagonal bifurcation intervention 03/2004  . Automatic implantable cardiac defibrillator -St. Jude's       . Benign neoplasm of colon   . CHF (congestive heart failure) ([place])   . Chronic systolic heart failure ([place])   . Coronary artery disease     Widely patent previously placed stents in the left anterior   . Crohn's disease ([place])   . Deep venous thrombosis ([facility])    Recurrent-on Coumadin  . Dialysis patient ([place])   . Dyspnea   . Gastroparesis   . GERD (gastroesophageal reflux disease)   . High cholesterol   . Hyperlipidemia   . Hypersomnolent    Previous diagnosis of narcolepsy  . Hypertension, essential   . Ischemic cardiomyopathy    Ejection fraction 15-20% catheterization 2010  . Type II or unspecified type diabetes mellitus without mention of complication, not stated as uncontrolled   . Unspecified gastritis and gastroduodenitis without mention of hemorrhage     Medications:  Medications Prior to Admission  Medication Sig Dispense Refill Last Dose  . acetaminophen  (TYLENOL) 325 MG tablet Take 2 tablets (650 mg total) by mouth every 4 (four) hours as needed for headache or mild pain.   03/25/2019 at prn  . aspirin EC 81 MG tablet Take 1 tablet (81 mg total) by mouth daily. 90 tablet 3 03/25/2019  . docusate sodium (COLACE) 100 MG capsule Take 1 capsule (100 mg total) by mouth daily as needed for mild constipation. 10 capsule 0 03/25/2019  . gabapentin (NEURONTIN) 100 MG capsule Take 1 capsule (100 mg total) by mouth 2 (two) times daily. (Patient taking differently: Take 100 mg by mouth 3 (three) times daily. ) 60 capsule 3 03/25/2019  . insulin glargine (LANTUS) 100 UNIT/ML injection Inject 0.1 mLs (10 Units total) into the skin daily. (Patient taking differently: Inject 5 Units into the skin daily. ) 10 mL 5 03/26/2019 at Unknown time  . midodrine (PROAMATINE) 5 MG tablet Take 1 tablet (5 mg total) by mouth daily. Take 1 tablet before dialysis treatment 30 tablet 6 03/25/2019  . multivitamin (RENA-VIT) TABS tablet Take 1 tablet by mouth daily.   03/23/2019  . NOVOLOG 100 UNIT/ML injection INJECT 7 UNITS INTO THE SKIN DAILY WITH SUPPER. (Patient taking differently: Inject 7 Units into the skin daily with supper. ) 10 mL 3 03/26/2019 at Unknown time  . oxyCODONE (ROXICODONE) 5 MG immediate release tablet Take 1-2 tablets every 4-6 hours for severe pain. 15 tablet 0 03/25/2019  . promethazine (PHENERGAN) 12.5 MG tablet Take 1 tablet (12.5  mg total) by mouth every 6 (six) hours as needed for nausea or vomiting. 30 tablet 3 03/27/2019 at prn  . warfarin (COUMADIN) 5 MG tablet TAKE AS FOLLOWS: MON-WED-FRI 1&1/2 TABLETS DAILY. ON ALL OTHER DAYS 1 TABLET (Patient taking differently: Take 5-705 mg by mouth one time only at 6 PM. 7.5 mg  Take Tuesday and Thursday 5 mg all the other days) 50 tablet 6 03/25/2019  . atorvastatin (LIPITOR) 40 MG tablet Take 1 tablet (40 mg total) by mouth daily at 6 PM. (Patient not taking: Reported on 03/27/2019) 30 tablet 6 Not Taking at Unknown time  .  calcitRIOL (ROCALTROL) 0.5 MCG capsule Take 1 capsule (0.5 mcg total) by mouth every Monday, Wednesday, and Friday. (Patient not taking: Reported on 03/27/2019) 15 capsule 5 03/25/2019  . citalopram (CELEXA) 20 MG tablet Take 1 tablet (20 mg total) by mouth daily. (Patient not taking: Reported on 03/27/2019) 30 tablet 6 Not Taking at Unknown time  . dronabinol (MARINOL) 2.5 MG capsule Take 1 capsule (2.5 mg total) by mouth 2 (two) times daily before lunch and supper. (Patient not taking: Reported on 03/27/2019) 60 capsule 5 03/25/2019  . erythromycin (E-MYCIN) 250 MG tablet Take 1 tablet (250 mg total) by mouth 3 (three) times daily. (Patient not taking: Reported on 03/27/2019) 90 tablet 4 Not Taking at Unknown time  . LORazepam (ATIVAN) 0.5 MG tablet Take 1 tablet (0.5 mg total) by mouth every 12 (twelve) hours as needed (nausea). (Patient not taking: Reported on 03/27/2019) 60 tablet 2 Not Taking at prn  . metoCLOPramide (REGLAN) 10 MG tablet Take 1 tablet (10 mg total) by mouth 3 (three) times daily. (Patient not taking: Reported on 03/27/2019) 90 tablet 6 Not Taking at Unknown time  . traMADol (ULTRAM) 50 MG tablet Take 1 tablet (50 mg total) by mouth every 6 (six) hours as needed for moderate pain. (Patient not taking: Reported on 03/27/2019) 30 tablet 0 Not Taking at Unknown time   Scheduled:  . aspirin EC  81 mg Oral Daily  . calcitRIOL  0.5 mcg Oral Q M,W,F  . Chlorhexidine Gluconate Cloth  6 each Topical Daily  . Chlorhexidine Gluconate Cloth  6 each Topical Q0600  . Chlorhexidine Gluconate Cloth  6 each Topical Q0600  . dronabinol  2.5 mg Oral BID AC  . erythromycin  250 mg Oral TID AC  . feeding supplement (ENSURE ENLIVE)  237 mL Oral TID BM  . gabapentin  100 mg Oral TID  . insulin aspart  0-15 Units Subcutaneous TID WC  . insulin aspart  0-5 Units Subcutaneous QHS  . insulin glargine  5 Units Subcutaneous QHS  . metoCLOPramide  10 mg Oral TID AC  . midodrine  5 mg Oral 3 times per day on  Mon Wed Fri  . multivitamin  1 tablet Oral QHS  . sodium chloride flush  10-40 mL Intracatheter Q12H  . Warfarin - Pharmacist Dosing Inpatient   Does not apply q1800   Infusions:  . ceFEPime (MAXIPIME) IV 1 g (03/31/19 1557)  . vancomycin Stopped (03/29/19 1318)    Assessment: 53yo male on Coumadin for LVAD, admitted for foot pain/PAD, now w/ INR below goal, to start heparin bridge.  Goal of Therapy:  Heparin level 0.3-0.7 units/ml Monitor platelets by anticoagulation protocol: Yes   Plan:  Will start heparin gtt at recently therapeutic rate of 1200 units/hr and monitor heparin levels and CBC.  Wynona Neat, PharmD, BCPS  04/01/2019,7:46 AM

## 2019-04-01 NOTE — Progress Notes (Signed)
  Progress Note    04/01/2019 11:29 AM   Subjective: Having mild right foot pain  Vitals:   04/01/19 1030 04/01/19 1100  BP: 108/63 117/74  Pulse: 73 81  Resp:    Temp:    SpO2:      Physical Exam: Awake alert oriented currently on dialysis Right foot really has dark discoloration of all the toes although the amputation site is intact of the fourth toe.  Foot is warm to the midfoot  CBC    Component Value Date/Time   WBC 14.1 (H) 04/01/2019 0246   RBC 2.52 (L) 04/01/2019 0246   HGB 7.8 (L) 04/01/2019 0246   HGB 11.7 (L) 09/07/2016 1457   HCT 23.9 (L) 04/01/2019 0246   HCT 37.5 09/07/2016 1457   PLT 259 04/01/2019 0246   PLT 279 09/07/2016 1457   MCV 94.8 04/01/2019 0246   MCV 81 09/07/2016 1457   MCH 31.0 04/01/2019 0246   MCHC 32.6 04/01/2019 0246   RDW 16.0 (H) 04/01/2019 0246   RDW 14.9 09/07/2016 1457   LYMPHSABS 1.0 03/27/2019 1351   LYMPHSABS 1.3 09/07/2016 1457   MONOABS 0.9 03/27/2019 1351   EOSABS 0.1 03/27/2019 1351   EOSABS 0.2 09/07/2016 1457   BASOSABS 0.0 03/27/2019 1351   BASOSABS 0.0 09/07/2016 1457    BMET    Component Value Date/Time   NA 135 04/01/2019 0246   NA 144 05/11/2017 0938   K 4.0 04/01/2019 0246   CL 95 (L) 04/01/2019 0246   CO2 27 04/01/2019 0246   GLUCOSE 186 (H) 04/01/2019 0246   BUN 47 (H) 04/01/2019 0246   BUN 68 (H) 05/11/2017 0938   CREATININE 10.03 (H) 04/01/2019 0246   CREATININE 1.63 (H) 11/09/2016 1505   CALCIUM 8.4 (L) 04/01/2019 0246   CALCIUM 8.7 06/27/2017 1152   GFRNONAA 5 (L) 04/01/2019 0246   GFRNONAA 48 (L) 11/09/2016 1505   GFRAA 6 (L) 04/01/2019 0246   GFRAA 55 (L) 11/09/2016 1505    INR    Component Value Date/Time   INR 1.6 (H) 04/01/2019 0246     Intake/Output Summary (Last 24 hours) at 04/01/2019 1129 Last data filed at 03/31/2019 2100 Gross per 24 hour  Intake 1017 ml  Output -  Net 1017 ml     Assessment/plan:  53 y.o. male is s/p right lower extremity revascularization for toe  amputation.  Does have dusky discoloration of most of his forefoot and distally.  Is likely going to require below-knee amputation but would allow his foot to demarcate prior.   Anala Whisenant C. Donzetta Matters, MD [facility] Office: [phone] Pager: [phone]  04/01/2019 11:29 AM

## 2019-04-02 ENCOUNTER — Ambulatory Visit: Payer: Self-pay | Admitting: Podiatry

## 2019-04-02 LAB — GLUCOSE, CAPILLARY
Glucose-Capillary: 106 mg/dL — ABNORMAL HIGH (ref 70–99)
Glucose-Capillary: 158 mg/dL — ABNORMAL HIGH (ref 70–99)
Glucose-Capillary: 143 mg/dL — ABNORMAL HIGH (ref 70–99)
Glucose-Capillary: 144 mg/dL — ABNORMAL HIGH (ref 70–99)

## 2019-04-02 LAB — PROTIME-INR
INR: 1.4 — ABNORMAL HIGH (ref 0.8–1.2)
Prothrombin Time: 16.9 seconds — ABNORMAL HIGH (ref 11.4–15.2)

## 2019-04-02 LAB — LACTATE DEHYDROGENASE: LDH: 226 U/L — ABNORMAL HIGH (ref 98–192)

## 2019-04-02 LAB — HEPARIN LEVEL (UNFRACTIONATED)
Heparin Unfractionated: 0.11 IU/mL — ABNORMAL LOW (ref 0.30–0.70)
Heparin Unfractionated: <0.1 IU/mL — ABNORMAL LOW (ref 0.30–0.70)

## 2019-04-02 LAB — CBC
HCT: 24.9 % — ABNORMAL LOW (ref 39.0–52.0)
Hemoglobin: 7.7 g/dL — ABNORMAL LOW (ref 13.0–17.0)
MCH: 30.4 pg (ref 26.0–34.0)
MCHC: 30.9 g/dL (ref 30.0–36.0)
MCV: 98.4 fL (ref 80.0–100.0)
Platelets: 242 10*3/uL (ref 150–400)
RBC: 2.53 MIL/uL — ABNORMAL LOW (ref 4.22–5.81)
RDW: 16 % — ABNORMAL HIGH (ref 11.5–15.5)
WBC: 18 10*3/uL — ABNORMAL HIGH (ref 4.0–10.5)
nRBC: 0.4 % — ABNORMAL HIGH (ref 0.0–0.2)

## 2019-04-02 MED ORDER — WARFARIN SODIUM 10 MG PO TABS
10.0000 mg | ORAL_TABLET | Freq: Once | ORAL | Status: AC
  Administered 2019-04-02: 10 mg via ORAL
  Filled 2019-04-02: qty 1

## 2019-04-02 MED ORDER — CHLORHEXIDINE GLUCONATE CLOTH 2 % EX PADS
6.0000 | MEDICATED_PAD | Freq: Every day | CUTANEOUS | Status: DC
  Administered 2019-04-03 – 2019-04-04 (×2): 6 via TOPICAL

## 2019-04-02 NOTE — Progress Notes (Signed)
  Progress Note    04/02/2019 3:31 PM   Subjective: Feeling better today  Vitals:   04/02/19 1049 04/02/19 1206  BP: 107/75 (!) 131/99  Pulse: 87   Resp: 18   Temp: 99.2 F (37.3 C)   SpO2: 98%     Physical Exam: Awake and alert Nonlabored respirations Right foot amputation site is intact There is rubor to the right foot it is warm to the midfoot Strong posterior tibial signal on the right   CBC    Component Value Date/Time   WBC 18.0 (H) 04/02/2019 0345   RBC 2.53 (L) 04/02/2019 0345   HGB 7.7 (L) 04/02/2019 0345   HGB 11.7 (L) 09/07/2016 1457   HCT 24.9 (L) 04/02/2019 0345   HCT 37.5 09/07/2016 1457   PLT 242 04/02/2019 0345   PLT 279 09/07/2016 1457   MCV 98.4 04/02/2019 0345   MCV 81 09/07/2016 1457   MCH 30.4 04/02/2019 0345   MCHC 30.9 04/02/2019 0345   RDW 16.0 (H) 04/02/2019 0345   RDW 14.9 09/07/2016 1457   LYMPHSABS 1.2 04/01/2019 1824   LYMPHSABS 1.3 09/07/2016 1457   MONOABS 1.3 (H) 04/01/2019 1824   EOSABS 0.4 04/01/2019 1824   EOSABS 0.2 09/07/2016 1457   BASOSABS 0.0 04/01/2019 1824   BASOSABS 0.0 09/07/2016 1457    BMET    Component Value Date/Time   NA 135 04/01/2019 0246   NA 144 05/11/2017 0938   K 4.0 04/01/2019 0246   CL 95 (L) 04/01/2019 0246   CO2 27 04/01/2019 0246   GLUCOSE 186 (H) 04/01/2019 0246   BUN 47 (H) 04/01/2019 0246   BUN 68 (H) 05/11/2017 0938   CREATININE 10.03 (H) 04/01/2019 0246   CREATININE 1.63 (H) 11/09/2016 1505   CALCIUM 8.4 (L) 04/01/2019 0246   CALCIUM 8.7 06/27/2017 1152   GFRNONAA 5 (L) 04/01/2019 0246   GFRNONAA 48 (L) 11/09/2016 1505   GFRAA 6 (L) 04/01/2019 0246   GFRAA 55 (L) 11/09/2016 1505    INR    Component Value Date/Time   INR 1.4 (H) 04/02/2019 0409     Intake/Output Summary (Last 24 hours) at 04/02/2019 1531 Last data filed at 04/02/2019 0814 Gross per 24 hour  Intake 395.91 ml  Output -  Net 395.91 ml     Assessment/plan:  53 y.o. male is right lower extremity  revascularization fourth toe amputation.  Does have some rubor pain to the right foot.  Will be high risk for transtibial amputation in the future.  At this time will wait to allow foot to demarcate can possibly heal a transmetatarsal amputation given that flow is via posterior tibial artery.  Should be okay for discharge from a vascular surgery standpoint and can see as an outpatient in a 2-3 weeks.   Nicklos Gaxiola C. Donzetta Matters, MD [facility] Office: [phone] Pager: [phone]  04/02/2019 3:31 PM

## 2019-04-02 NOTE — Progress Notes (Signed)
LVAD Coordinator Rounding Note:  Pt admitted per Dr. Haroldine Laws 03/27/19 for nausea, vomiting, and right foot pain.  HM III LVAD implanted on 06/12/17 by Dr. Prescott Gum under Destination Therapy criteria due to renal insufficiency. Pump exchange on 03/01/18 for outflow graft twist/obstruction.   Patient lying in bed, says his pain is "better today" than yesterday.   Vital signs: Temp: 99.2 HR:  89 Doppler Pressure:  Auto BP:  O2 Sat: 98% on RA Wt: 182.9>186.2>193.3>193.5 lbs  LVAD interrogation reveals:  Speed: 5500 Flow: 4.1 Power: 4.4w PI: 3.5 Alarms: none Events: none  Hematocrit: 25  Fixed speed: 5500 Low speed limit: 5200  Drive Line:  CDI., anchor intact and accurately applied. Weekly dressing kits.  Next dressing change due 04/03/19; bedside nurse may change dressing.   Labs: LDH trend: 181>159>204>246>226  INR trend: 4.0>4.4>1.6>1.4  WBC trend: 18.6>18.9>14.1>18.0  Infection:  Blood cx 03/27/19>> negative final  Gtts: - Vasopressin >> started 03/28/19>>off 03/28/19 - Heparin>>1000 units/hr   Anticoagulation Plan: - INR Goal: 2.0 - 3.0 - ASA Dose: 81 mg daily   Device: -St Jude dual  -Therapies: on 200 bpm - Monitor: on VT 150 bpm  Adverse Events on VAD: - ESRD post LVAD; started CVVH 06/15/17; HD started 06/20/17 - Hospitalization 11/5 - 07/16/17 > gastroparesis diagnosis after EGD - Hospitalization 11/26 - 08/04/17 > gastroparesis - Hospitalization 1/8 - 09/16/17>gastroparesis - referred to Dr. Corliss Parish at [facility] - 10/31/17>rightupper arm arteriovenous graft with Artegraft; ligation of right first stage brachial vein transposition  - 12/11/13 elective admission for thrombectomy for AV graft in RUA  - 12/20/17 > intractable N/V - 01/19/18 > intractable N/V sent by EMS from dialysis center - 03/01/18 > outflow graft twist/occulsion requiring pump exchange - 05/23/18 > admitted for non-working fistula in RUA - 06/10/18 > admitted for intractable N/V - 08/08/18 >  admitted for peri-op management for 2nd stage of AV fistula, found to be sclerotic. New LUE graft created from brachial artery to axillary vein. Developed spontaneous LUE hematoma.  - 08/22/18 > admitted for intractable N/V - 08/28/18> admitted for intractable N/V/D and low INR  Plan/Recommendations:  1. Please page VAD coordinator with any equipment questions or concerns. 2. Weekly VAD dressing changes - due 04/03/19 bedside nurse to perform.   Zada Girt RN [place] Coordinator  Office: [phone]  24/7 Pager: [phone]

## 2019-04-02 NOTE — Progress Notes (Signed)
1106 PT's note from last admission read, and pt unable to ambulate at that time. Would recommend PT consult at this time and we will follow their progress as pt more appropriate for their services . Graylon Good RN BSN 04/02/2019 11:08 AM

## 2019-04-02 NOTE — Progress Notes (Signed)
Patient ID: [name], male   DOB: 1966-06-12, 53 y.o.   MRN: [id number]   Advanced Heart Failure VAD Team Note  PCP-Cardiologist: No primary care provider on file.   Subjective:    Admitted with intractable N/V and R foot pain.   Nausea better, eating and has had a bowel movement.  Foot pain better.   WBCs 20 => 17 => 14 => 18, afebrile.  ESR 70.  Right foot film with possible osteomyelitis 4th metatarsal head.  Seen by ID, thought to be more likely reactive changes related to recent foot surgery.   Acute severe sharp pain right flank yesterday afternoon after HD.  CT abdomen/pelvis showed no acute abnormalities, no evidence for nephrolithiasis or discitis.  No RP hemorrhage.  In setting of severe pain, ?mental status changes and had CT head which was unremarkable.  Now, abdominal pain has resolved.   Had HD yesterday.   INR 1.4 on heparin gtt.    LVAD INTERROGATION:  HeartMate 3 LVAD:   Flow 3.7 liters/min, speed 5500 , power 4.4, PI 5.1. No PI events.   Objective:    Vital Signs:   Temp:  [97.8 F (36.6 C)-98.5 F (36.9 C)] 98 F (36.7 C) (07/28 0300) Pulse Rate:  [70-105] 80 (07/28 0300) Resp:  [15-23] 23 (07/28 0300) BP: (99-149)/(59-99) 149/69 (07/28 0300) SpO2:  [94 %-100 %] 99 % (07/28 0300) Weight:  [87.7 kg-87.8 kg] 87.8 kg (07/28 0300) Last BM Date: 03/31/19 Mean arterial Pressure 80s this morning, was high yesterday pm in setting of severe pain.  Intake/Output:   Intake/Output Summary (Last 24 hours) at 04/02/2019 0728 Last data filed at 04/02/2019 0332 Gross per 24 hour  Intake 275.91 ml  Output 2368 ml  Net -2092.09 ml     Physical Exam   General: Well appearing this am. NAD.  HEENT: Normal. Neck: Supple, JVP 7-8 cm. Carotids OK.  Cardiac:  Mechanical heart sounds with LVAD hum present.  Lungs:  CTAB, normal effort.  Abdomen:  NT, ND, no HSM. No bruits or masses. +BS  LVAD exit site: Well-healed and incorporated. Dressing dry and intact. No  erythema or drainage. Stabilization device present and accurately applied. Driveline dressing changed daily per sterile technique. Extremities: No edema.  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  Tips of other toes on right foot are dark.    Neuro:  Alert & oriented x 3. Cranial nerves grossly intact. Moves all 4 extremities w/o difficulty. Affect pleasant     Telemetry   NSR 80s. Personally reviewed   EKG    N/a   Labs   Basic Metabolic Panel: Recent Labs  Lab 03/28/19 0318 03/29/19 0337 03/30/19 0316 03/31/19 0325 04/01/19 0246  NA 134* 133* 133* 137 135  K 4.7 4.2 3.7 3.7 4.0  CL 92* 99 96* 97* 95*  CO2 22 23 26 26 27   GLUCOSE 164* 195* 226* 176* 186*  BUN 25* 43* 26* 38* 47*  CREATININE 6.47* 8.43* 5.73* 8.16* 10.03*  CALCIUM 9.2 8.3* 8.3* 8.6* 8.4*    Liver Function Tests: Recent Labs  Lab 03/27/19 1351  AST 28  ALT 9  ALKPHOS 105  BILITOT 0.7  PROT 8.3*  ALBUMIN 3.1*   No results for input(s): LIPASE, AMYLASE in the last 168 hours. No results for input(s): AMMONIA in the last 168 hours.  CBC: Recent Labs  Lab 03/27/19 1351  03/31/19 0325 04/01/19 0246 04/01/19 1515 04/01/19 1824 04/02/19 0345  WBC 17.1*   < > 17.0*  14.1* 23.3* 23.4* 18.0*  NEUTROABS 14.9*  --   --   --   --  19.9*  --   HGB 9.9*   < > 8.2* 7.8* 8.5* 8.0* 7.7*  HCT 31.5*   < > 25.5* 23.9* 25.9* 25.3* 24.9*  MCV 94.9   < > 95.1 94.8 93.2 95.8 98.4  PLT 322   < > 263 259 238 248 242   < > = values in this interval not displayed.    INR: Recent Labs  Lab 03/29/19 0337 03/30/19 0316 03/31/19 0325 04/01/19 0246 04/02/19 0409  INR 4.4* 2.8* 1.9* 1.6* 1.4*    Other results:    Imaging   Ct Abdomen Pelvis Wo Contrast  Result Date: 04/01/2019 CLINICAL DATA:  Sudden onset of severe low back and abdominal pain after starting heparin. Left ventricular assist device. EXAM: CT ABDOMEN AND PELVIS WITHOUT CONTRAST TECHNIQUE: Multidetector CT imaging of the abdomen and pelvis was  performed following the standard protocol without IV contrast. COMPARISON:  Chest radiograph 03/27/2019. Abdominopelvic CT 08/28/2018. FINDINGS: Examination was performed with the patient in the right lateral decubitus position. Despite efforts by the technologist and patient, mild motion artifact is present on today's exam and could not be eliminated. This reduces exam sensitivity and specificity. Lower chest: Similar subsegmental atelectasis at both lung bases and a small left pleural effusion. No basilar pneumothorax. The visualized portions of the left ventricular assist device are intact. Atherosclerosis of the aorta and coronary arteries noted. Hepatobiliary: The liver appears unremarkable as imaged in the noncontrast state. The gallbladder is incompletely distended. No evidence of gallstones, gallbladder wall thickening or biliary dilatation. Pancreas: Unremarkable. No pancreatic ductal dilatation or surrounding inflammatory changes. Spleen: Normal in size without focal abnormality. Adrenals/Urinary Tract: Both adrenal glands appear normal. The kidneys, ureters and bladder appear normal. No evidence of urinary tract calculus or hydronephrosis. Stomach/Bowel: No evidence of bowel wall thickening, distention or surrounding inflammatory change. Stable postsurgical changes from right hemicolectomy and anastomosis. Vascular/Lymphatic: There are no enlarged abdominal or pelvic lymph nodes. Moderate diffuse aortic and branch vessel atherosclerosis. Reproductive: The prostate gland and seminal vesicles appear normal. Other: No free air, ascites or intra-abdominal inflammatory changes. No evidence of peritoneal or retroperitoneal hemorrhage. Musculoskeletal: No acute or significant osseous findings. IMPRESSION: 1. No acute findings or explanation for the patient's symptoms. No evidence of intra-abdominal hemorrhage. 2. The visualized left ventricular assist device appears unchanged. Chronic bibasilar atelectasis or  scarring. 3.  Aortic Atherosclerosis (ICD10-I70.0). Electronically Signed   By: Richardean Sale M.D.   On: 04/01/2019 14:26   Ct Head Wo Contrast  Result Date: 04/02/2019 CLINICAL DATA:  Altered mental status EXAM: CT HEAD WITHOUT CONTRAST TECHNIQUE: Contiguous axial images were obtained from the base of the skull through the vertex without intravenous contrast. COMPARISON:  CT brain 08/22/2018 FINDINGS: Brain: No acute territorial infarction, hemorrhage or intracranial mass. Stable atrophy and mild small vessel ischemic changes of the white matter. Chronic lacunar infarct left cerebellum. Stable ventricle size. Vascular: No hyperdense vessels. Carotid vascular and vertebral artery calcification Skull: Normal. Negative for fracture or focal lesion. Sinuses/Orbits: Mucous retention cyst in the right frontal sinus. Mild mucosal thickening in the maxillary and ethmoid sinuses Other: None IMPRESSION: 1. No CT evidence for acute intracranial abnormality. 2. Atrophy and minimal small vessel ischemic changes of the white matter. Electronically Signed   By: Donavan Foil M.D.   On: 04/02/2019 00:03     Medications:     Scheduled Medications: . aspirin  EC  81 mg Oral Daily  . calcitRIOL  0.5 mcg Oral Q M,W,F  . Chlorhexidine Gluconate Cloth  6 each Topical Daily  . Chlorhexidine Gluconate Cloth  6 each Topical Q0600  . Chlorhexidine Gluconate Cloth  6 each Topical Q0600  . dronabinol  2.5 mg Oral BID AC  . erythromycin  250 mg Oral TID AC  . feeding supplement (ENSURE ENLIVE)  237 mL Oral TID BM  . gabapentin  100 mg Oral TID  . insulin aspart  0-15 Units Subcutaneous TID WC  . insulin aspart  0-5 Units Subcutaneous QHS  . insulin glargine  5 Units Subcutaneous QHS  . lidocaine  1 patch Transdermal Q24H  . metoCLOPramide  10 mg Oral TID AC  . midodrine  5 mg Oral 3 times per day on Mon Wed Fri  . multivitamin  1 tablet Oral QHS  . sodium chloride flush  10-40 mL Intracatheter Q12H  . Warfarin -  Pharmacist Dosing Inpatient   Does not apply q1800    Infusions: . ceFEPime (MAXIPIME) IV Stopped (04/01/19 1821)  . heparin 1,000 Units/hr (04/02/19 0300)  . vancomycin Stopped (04/01/19 1355)    PRN Medications: acetaminophen, docusate sodium, fentaNYL (SUBLIMAZE) injection, hydrALAZINE, LORazepam, ondansetron (ZOFRAN) IV, oxyCODONE, promethazine **OR** promethazine **OR** promethazine, sodium chloride flush, traMADol    Assessment/Plan:    1. PAD:  Peripheral angiogram 7/13 with 80% right SFA stenosis and occluded right PT.  He had stenting of SFA and PTCA of PT with restoration of flow.  However, he still needed amputation right 4th toe on 7/16 with intractable pain.  He was readmitted with pain in his right foot, nausea, and vomiting.  Pain somewhat better now but remaining toes on right foot are dark at the tips concerning for ischemia.  He also has significant unrevascularized disease in left leg.   ESR 70, concern for osteomyelitis at right 4th metatarsal head => seen by ID and thought to be most likely reactive changes post-surgery.  WBCs elevated but afebrile.  Discussed with Dr. Donzetta Matters, suspect he will eventually need wider amputation in the future but does not seem emergent and can likely hold off for now.  - Will await demarcation of foot, to be seen by Dr. Donzetta Matters in office post-discharge.  - Await guidance from ID regarding antibiotics at discharge, if any.  2. Chronic systolic CHF: Ischemic cardiomyopathy. St Jude ICD. NSTEMI in March 2018 with DES to LAD and RCA, complicated by cardiogenic shock, low output requiring milrinone. Echo 8/18 with EF 20-25%, moderate MR. CPX (8/18) with severe functional impairment due to HF. He is s/p HM3 LVAD placement. He had pump thrombosis 6/19 due to [facility] outflow graft kinking. He had pump exchange for another HM3. INR 1.4 today.  - Volume managed at dialysis.  - Continue ASA 81 and warfarin with INR goal 2-2.5.  INR 1.4 today, continuing IV  heparin until INR 1.8.   - He has been [facility] for evaluation for heart/kidney transplantand recently saw Dr Mosetta Pigeon => he may not be a candidate for transplant given extensive PAD.  3. ESRD: Developed peri-operatively after HM3 placement. 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  4. CAD: NSTEMI in 3/18. LHC with 99% ulcerated lesion proximal RCA with left to right collaterals, 95% mid LAD stenosis after mid LAD stent. s/p PCI to RCA and LAD on 11/25/16.  -Continue statin and ASA. 4. h/o DVTs: Factor V  Leiden heterozygote. -Anticoagulated. 5. Diabetic gastroparesis: Difficult to manage. He has seen gastroparesis specialist at [facility]. Surprisingly, gastric emptying study was normal. However, electrogastrogram showed poor gastric accomodation/capacity. Therefore, small frequent meals recommended.Had nausea/vomiting this admission, narcotics likely making this worse with constipation. He is feeling better and eating, has had BM.  - Continue reglan 10 mg tid/ac and Marinol.  - Continue Protonix bid.  - He will continue erythromycinindefinitely.  - Limit narcotic use as much as possible.  6. CE:QFDVO with SSI and home glargine. 7. HTN:MAP has been lower with dialysis, now off Bidil and getting midodrine prior to HD. 8. Atrial flutter: Paroxysmal. He has been in NSR recently.  - Continue amiodarone. 9. Left hand tingling and grip weakness: Present since he developed left arm hematoma from failed AV graft. 10. ID: WBCs up but no fever.  As above, right floot plain film concerning for osteomyelitis right 4th metatarsal head.  ESR 70. However, seen by ID and think most likely reactive post-surgery and less likely infectious.  - He remains on cefepime/vancomycin, await ID guidance regarding antibiotic regimen for home (if any thought to be needed). 11. Right flank pain: Severe, sharp 7/27 afternoon.  CT  abdomen/pelvis showed no acute finding to explain pain.  No evidence for nephrolithiasis, RP hemorrhage, discitis, fecal impaction.  Pain now resolved.  ?Related to fluid shifts with HD as it started after HD.   If no further vascular procedures, he can go home when INR > 1.8, possibly tomorrow after HD.     I reviewed the LVAD parameters from today, and compared the results to the patient's prior recorded data.  No programming changes were made.  The LVAD is functioning within specified parameters.  The patient performs LVAD self-test daily.  LVAD interrogation was + 3 low flows. LVAD equipment check completed and is in good working order.  Back-up equipment present.   LVAD education done on emergency procedures and precautions and reviewed exit site care.  Length of Stay: 6  Loralie Champagne, MD 04/02/2019, 7:28 AM  VAD Team --- VAD ISSUES ONLY--- Pager [phone] (7am - 7am)  Advanced Heart Failure Team  Pager [phone] (M-F; 7a - 4p)  Please contact [facility] for night-coverage after hours (4p -7a ) and weekends on [email or web address]

## 2019-04-02 NOTE — Progress Notes (Signed)
[place] for heparin Indication: LVAD  Allergies  Allergen Reactions  . Metformin And Related Diarrhea    Patient Measurements: Height: 5' 7.01" (170.2 cm) Weight: 193 lb 9 oz (87.8 kg) IBW/kg (Calculated) : 66.12 Heparin Dosing Weight: 80kg  Vital Signs: Temp: 98 F (36.7 C) (07/28 0300) Temp Source: Oral (07/28 0300) BP: 149/69 (07/28 0300) Pulse Rate: 80 (07/28 0300)  Labs: Recent Labs    03/31/19 0325 04/01/19 0246 04/01/19 1324 04/01/19 1515 04/01/19 1824 04/02/19 0345 04/02/19 0409  HGB 8.2* 7.8*  --  8.5* 8.0* 7.7*  --   HCT 25.5* 23.9*  --  25.9* 25.3* 24.9*  --   PLT 263 259  --  238 248 242  --   LABPROT 21.1* 18.9*  --   --   --   --  16.9*  INR 1.9* 1.6*  --   --   --   --  1.4*  HEPARINUNFRC  --   --  0.21*  --   --   --  0.11*  CREATININE 8.16* 10.03*  --   --   --   --   --     Estimated Creatinine Clearance: 8.9 mL/min (A) (by C-G formula based on SCr of 10.03 mg/dL (H)).   Medical History: Past Medical History:  Diagnosis Date  . Angina   . ASCVD (arteriosclerotic cardiovascular disease)    , Anterior infarction 2005, LAD diagonal bifurcation intervention 03/2004  . Automatic implantable cardiac defibrillator -[employer]       . Benign neoplasm of colon   . CHF (congestive heart failure) ([place])   . Chronic systolic heart failure ([place])   . Coronary artery disease     Widely patent previously placed stents in the left anterior   . Crohn's disease ([place])   . Deep venous thrombosis (H[facility])    Recurrent-on Coumadin  . Dialysis patient ([place])   . Dyspnea   . Gastroparesis   . GERD (gastroesophageal reflux disease)   . High cholesterol   . Hyperlipidemia   . Hypersomnolent    Previous diagnosis of narcolepsy  . Hypertension, essential   . Ischemic cardiomyopathy    Ejection fraction 15-20% catheterization 2010  . Type II or unspecified type diabetes mellitus without mention of complication, not stated  as uncontrolled   . Unspecified gastritis and gastroduodenitis without mention of hemorrhage     Medications:  Medications Prior to Admission  Medication Sig Dispense Refill Last Dose  . acetaminophen (TYLENOL) 325 MG tablet Take 2 tablets (650 mg total) by mouth every 4 (four) hours as needed for headache or mild pain.   03/25/2019 at prn  . aspirin EC 81 MG tablet Take 1 tablet (81 mg total) by mouth daily. 90 tablet 3 03/25/2019  . docusate sodium (COLACE) 100 MG capsule Take 1 capsule (100 mg total) by mouth daily as needed for mild constipation. 10 capsule 0 03/25/2019  . gabapentin (NEURONTIN) 100 MG capsule Take 1 capsule (100 mg total) by mouth 2 (two) times daily. (Patient taking differently: Take 100 mg by mouth 3 (three) times daily. ) 60 capsule 3 03/25/2019  . insulin glargine (LANTUS) 100 UNIT/ML injection Inject 0.1 mLs (10 Units total) into the skin daily. (Patient taking differently: Inject 5 Units into the skin daily. ) 10 mL 5 03/26/2019 at Unknown time  . midodrine (PROAMATINE) 5 MG tablet Take 1 tablet (5 mg total) by mouth daily. Take 1 tablet before dialysis treatment  30 tablet 6 03/25/2019  . multivitamin (RENA-VIT) TABS tablet Take 1 tablet by mouth daily.   03/23/2019  . NOVOLOG 100 UNIT/ML injection INJECT 7 UNITS INTO THE SKIN DAILY WITH SUPPER. (Patient taking differently: Inject 7 Units into the skin daily with supper. ) 10 mL 3 03/26/2019 at Unknown time  . oxyCODONE (ROXICODONE) 5 MG immediate release tablet Take 1-2 tablets every 4-6 hours for severe pain. 15 tablet 0 03/25/2019  . promethazine (PHENERGAN) 12.5 MG tablet Take 1 tablet (12.5 mg total) by mouth every 6 (six) hours as needed for nausea or vomiting. 30 tablet 3 03/27/2019 at prn  . warfarin (COUMADIN) 5 MG tablet TAKE AS FOLLOWS: MON-WED-FRI 1&1/2 TABLETS DAILY. ON ALL OTHER DAYS 1 TABLET (Patient taking differently: Take 5-705 mg by mouth one time only at 6 PM. 7.5 mg  Take Tuesday and Thursday 5 mg all the  other days) 50 tablet 6 03/25/2019  . atorvastatin (LIPITOR) 40 MG tablet Take 1 tablet (40 mg total) by mouth daily at 6 PM. (Patient not taking: Reported on 03/27/2019) 30 tablet 6 Not Taking at Unknown time  . calcitRIOL (ROCALTROL) 0.5 MCG capsule Take 1 capsule (0.5 mcg total) by mouth every Monday, Wednesday, and Friday. (Patient not taking: Reported on 03/27/2019) 15 capsule 5 03/25/2019  . citalopram (CELEXA) 20 MG tablet Take 1 tablet (20 mg total) by mouth daily. (Patient not taking: Reported on 03/27/2019) 30 tablet 6 Not Taking at Unknown time  . dronabinol (MARINOL) 2.5 MG capsule Take 1 capsule (2.5 mg total) by mouth 2 (two) times daily before lunch and supper. (Patient not taking: Reported on 03/27/2019) 60 capsule 5 03/25/2019  . erythromycin (E-MYCIN) 250 MG tablet Take 1 tablet (250 mg total) by mouth 3 (three) times daily. (Patient not taking: Reported on 03/27/2019) 90 tablet 4 Not Taking at Unknown time  . LORazepam (ATIVAN) 0.5 MG tablet Take 1 tablet (0.5 mg total) by mouth every 12 (twelve) hours as needed (nausea). (Patient not taking: Reported on 03/27/2019) 60 tablet 2 Not Taking at prn  . metoCLOPramide (REGLAN) 10 MG tablet Take 1 tablet (10 mg total) by mouth 3 (three) times daily. (Patient not taking: Reported on 03/27/2019) 90 tablet 6 Not Taking at Unknown time  . traMADol (ULTRAM) 50 MG tablet Take 1 tablet (50 mg total) by mouth every 6 (six) hours as needed for moderate pain. (Patient not taking: Reported on 03/27/2019) 30 tablet 0 Not Taking at Unknown time   Scheduled:  . aspirin EC  81 mg Oral Daily  . calcitRIOL  0.5 mcg Oral Q M,W,F  . Chlorhexidine Gluconate Cloth  6 each Topical Daily  . Chlorhexidine Gluconate Cloth  6 each Topical Q0600  . Chlorhexidine Gluconate Cloth  6 each Topical Q0600  . dronabinol  2.5 mg Oral BID AC  . erythromycin  250 mg Oral TID AC  . feeding supplement (ENSURE ENLIVE)  237 mL Oral TID BM  . gabapentin  100 mg Oral TID  . insulin  aspart  0-15 Units Subcutaneous TID WC  . insulin aspart  0-5 Units Subcutaneous QHS  . insulin glargine  5 Units Subcutaneous QHS  . lidocaine  1 patch Transdermal Q24H  . metoCLOPramide  10 mg Oral TID AC  . midodrine  5 mg Oral 3 times per day on Mon Wed Fri  . multivitamin  1 tablet Oral QHS  . sodium chloride flush  10-40 mL Intracatheter Q12H  . Warfarin - Pharmacist Dosing Inpatient  Does not apply q1800   Infusions:  . ceFEPime (MAXIPIME) IV Stopped (04/01/19 1821)  . heparin 1,000 Units/hr (04/02/19 0300)  . vancomycin Stopped (04/01/19 1355)    Assessment: 53yo male on Coumadin for LVAD, admitted for foot pain/PAD, now w/ INR below goal, to start heparin bridge.  Heparin level was subtherapeutic on 0.11, on 1000 units/hr. Hgb down to 7.7 today, plt 242. LDH stable at 226. INR continued to decrease to 1.4 today s/p restarting on 7/25 (had been held 5 days prior to restart). Has ensure ordered - received 1 dose yesterday.  Goal of Therapy:  Heparin level 0.3-0.7 units/ml Monitor platelets by anticoagulation protocol: Yes   Plan:  Will increase heparin infusion to 1250 units/hr to target goal ~0.3 Will order warfarin 10 mg tonight  Will monitor level with morning labs Monitor daily HL, CBC, and for s/sx of bleeding  Antonietta Jewel, PharmD, [place] Clinical Pharmacist  Pager: [phone] Phone: [phone] 04/02/2019,7:22 AM

## 2019-04-02 NOTE — Progress Notes (Signed)
ANTICOAGULATION CONSULT NOTE: Follow-up  Pharmacy Consult for heparin Indication: LVAD  Allergies  Allergen Reactions  . Metformin And Related Diarrhea    Patient Measurements: Height: 5' 7.01" (170.2 cm) Weight: 193 lb 9 oz (87.8 kg) IBW/kg (Calculated) : 66.12 Heparin Dosing Weight: 80kg  Vital Signs: Temp: 99.2 F (37.3 C) (07/28 1049) Temp Source: Oral (07/28 1049) BP: 111/83 (07/28 1634) Pulse Rate: 87 (07/28 1049)  Labs: Recent Labs    03/31/19 0325 04/01/19 0246 04/01/19 1324 04/01/19 1515 04/01/19 1824 04/02/19 0345 04/02/19 0409 04/02/19 1620  HGB 8.2* 7.8*  --  8.5* 8.0* 7.7*  --   --   HCT 25.5* 23.9*  --  25.9* 25.3* 24.9*  --   --   PLT 263 259  --  238 248 242  --   --   LABPROT 21.1* 18.9*  --   --   --   --  16.9*  --   INR 1.9* 1.6*  --   --   --   --  1.4*  --   HEPARINUNFRC  --   --  0.21*  --   --   --  0.11* <0.10*  CREATININE 8.16* 10.03*  --   --   --   --   --   --     Estimated Creatinine Clearance: 8.9 mL/min (A) (by C-G formula based on SCr of 10.03 mg/dL (H)).   Medical History: Past Medical History:  Diagnosis Date  . Angina   . ASCVD (arteriosclerotic cardiovascular disease)    , Anterior infarction 2005, LAD diagonal bifurcation intervention 03/2004  . Automatic implantable cardiac defibrillator -[employer]       . Benign neoplasm of colon   . CHF (congestive heart failure) ([place])   . Chronic systolic heart failure ([place])   . Coronary artery disease     Widely patent previously placed stents in the left anterior   . Crohn's disease ([place])   . Deep venous thrombosis (H[facility])    Recurrent-on Coumadin  . Dialysis patient ([place])   . Dyspnea   . Gastroparesis   . GERD (gastroesophageal reflux disease)   . High cholesterol   . Hyperlipidemia   . Hypersomnolent    Previous diagnosis of narcolepsy  . Hypertension, essential   . Ischemic cardiomyopathy    Ejection fraction 15-20% catheterization 2010  . Type II or unspecified  type diabetes mellitus without mention of complication, not stated as uncontrolled   . Unspecified gastritis and gastroduodenitis without mention of hemorrhage     Medications:  Medications Prior to Admission  Medication Sig Dispense Refill Last Dose  . acetaminophen (TYLENOL) 325 MG tablet Take 2 tablets (650 mg total) by mouth every 4 (four) hours as needed for headache or mild pain.   03/25/2019 at prn  . aspirin EC 81 MG tablet Take 1 tablet (81 mg total) by mouth daily. 90 tablet 3 03/25/2019  . docusate sodium (COLACE) 100 MG capsule Take 1 capsule (100 mg total) by mouth daily as needed for mild constipation. 10 capsule 0 03/25/2019  . gabapentin (NEURONTIN) 100 MG capsule Take 1 capsule (100 mg total) by mouth 2 (two) times daily. (Patient taking differently: Take 100 mg by mouth 3 (three) times daily. ) 60 capsule 3 03/25/2019  . insulin glargine (LANTUS) 100 UNIT/ML injection Inject 0.1 mLs (10 Units total) into the skin daily. (Patient taking differently: Inject 5 Units into the skin daily. ) 10 mL 5 03/26/2019 at Unknown time  .  midodrine (PROAMATINE) 5 MG tablet Take 1 tablet (5 mg total) by mouth daily. Take 1 tablet before dialysis treatment 30 tablet 6 03/25/2019  . multivitamin (RENA-VIT) TABS tablet Take 1 tablet by mouth daily.   03/23/2019  . NOVOLOG 100 UNIT/ML injection INJECT 7 UNITS INTO THE SKIN DAILY WITH SUPPER. (Patient taking differently: Inject 7 Units into the skin daily with supper. ) 10 mL 3 03/26/2019 at Unknown time  . oxyCODONE (ROXICODONE) 5 MG immediate release tablet Take 1-2 tablets every 4-6 hours for severe pain. 15 tablet 0 03/25/2019  . promethazine (PHENERGAN) 12.5 MG tablet Take 1 tablet (12.5 mg total) by mouth every 6 (six) hours as needed for nausea or vomiting. 30 tablet 3 03/27/2019 at prn  . warfarin (COUMADIN) 5 MG tablet TAKE AS FOLLOWS: MON-WED-FRI 1&1/2 TABLETS DAILY. ON ALL OTHER DAYS 1 TABLET (Patient taking differently: Take 5-705 mg by mouth one time  only at 6 PM. 7.5 mg  Take Tuesday and Thursday 5 mg all the other days) 50 tablet 6 03/25/2019  . atorvastatin (LIPITOR) 40 MG tablet Take 1 tablet (40 mg total) by mouth daily at 6 PM. (Patient not taking: Reported on 03/27/2019) 30 tablet 6 Not Taking at Unknown time  . calcitRIOL (ROCALTROL) 0.5 MCG capsule Take 1 capsule (0.5 mcg total) by mouth every Monday, Wednesday, and Friday. (Patient not taking: Reported on 03/27/2019) 15 capsule 5 03/25/2019  . citalopram (CELEXA) 20 MG tablet Take 1 tablet (20 mg total) by mouth daily. (Patient not taking: Reported on 03/27/2019) 30 tablet 6 Not Taking at Unknown time  . dronabinol (MARINOL) 2.5 MG capsule Take 1 capsule (2.5 mg total) by mouth 2 (two) times daily before lunch and supper. (Patient not taking: Reported on 03/27/2019) 60 capsule 5 03/25/2019  . erythromycin (E-MYCIN) 250 MG tablet Take 1 tablet (250 mg total) by mouth 3 (three) times daily. (Patient not taking: Reported on 03/27/2019) 90 tablet 4 Not Taking at Unknown time  . LORazepam (ATIVAN) 0.5 MG tablet Take 1 tablet (0.5 mg total) by mouth every 12 (twelve) hours as needed (nausea). (Patient not taking: Reported on 03/27/2019) 60 tablet 2 Not Taking at prn  . metoCLOPramide (REGLAN) 10 MG tablet Take 1 tablet (10 mg total) by mouth 3 (three) times daily. (Patient not taking: Reported on 03/27/2019) 90 tablet 6 Not Taking at Unknown time  . traMADol (ULTRAM) 50 MG tablet Take 1 tablet (50 mg total) by mouth every 6 (six) hours as needed for moderate pain. (Patient not taking: Reported on 03/27/2019) 30 tablet 0 Not Taking at Unknown time   Scheduled:  . aspirin EC  81 mg Oral Daily  . calcitRIOL  0.5 mcg Oral Q M,W,F  . Chlorhexidine Gluconate Cloth  6 each Topical Daily  . Chlorhexidine Gluconate Cloth  6 each Topical Q0600  . Chlorhexidine Gluconate Cloth  6 each Topical Q0600  . [START ON 04/03/2019] Chlorhexidine Gluconate Cloth  6 each Topical Q0600  . dronabinol  2.5 mg Oral BID AC  .  erythromycin  250 mg Oral TID AC  . feeding supplement (ENSURE ENLIVE)  237 mL Oral TID BM  . gabapentin  100 mg Oral TID  . insulin aspart  0-15 Units Subcutaneous TID WC  . insulin aspart  0-5 Units Subcutaneous QHS  . insulin glargine  5 Units Subcutaneous QHS  . lidocaine  1 patch Transdermal Q24H  . metoCLOPramide  10 mg Oral TID AC  . midodrine  5 mg Oral 3  times per day on Mon Wed Fri  . multivitamin  1 tablet Oral QHS  . sodium chloride flush  10-40 mL Intracatheter Q12H  . warfarin  10 mg Oral ONCE-1800  . Warfarin - Pharmacist Dosing Inpatient   Does not apply q1800   Infusions:  . heparin 1,250 Units/hr (04/02/19 1620)  . vancomycin Stopped (04/01/19 1355)    Assessment: 54yo male on Coumadin for LVAD, admitted for foot pain/PAD, now w/ INR below goal, to start heparin bridge.  Patient receiving heparin infusion in central line. HL was 0.11 (subtherapeutic) on 1000 units/hr which was increased to 1250 units/hr earlier today. The nurse found the heparin infusion off for an undetermined amount of time this evening, HL was found to be <0.10.   Hgb down to 7.7 today, plt 242. LDH stable at 226. INR continued to decrease to 1.4 today s/p restarting on 7/25 (had been held 5 days prior to restart). Has ensure ordered - received 1 dose yesterday.   Goal of Therapy:  Heparin level 0.3-0.7 units/ml Monitor platelets by anticoagulation protocol: Yes   Plan:  Will restart heparin infusion at 1250 units/hr to target goal ~0.3 Warfarin 10 mg ordered tonight  Recheck HL in 6 hrs at 2300 Monitor daily HL, CBC, and for s/sx of bleeding  Agnes Lawrence, PharmD PGY1 Pharmacy Resident

## 2019-04-02 NOTE — Progress Notes (Signed)
[facility] Progress Note  Subjective:  He has been in [place].   Last HD on 7/27 with 2.4 kg UF.  Feels a little better this morning.   Review of systems:   Denies chest pain or shortness of breath States had nausea and this is improved Abdominal pain is improving  Back pain is getting better   Vitals:   04/01/19 2006 04/01/19 2317 04/02/19 0021 04/02/19 0300  BP: (!) 125/92 (!) 115/95 101/73 (!) 149/69  Pulse: (!) 105 74  80  Resp: 18 15  (!) 23  Temp: 98 F (36.7 C) 97.8 F (36.6 C)  98 F (36.7 C)  TempSrc: Oral Oral  Oral  SpO2: 94% 100%  99%  Weight:    87.8 kg  Height:        Inpatient medications: . aspirin EC  81 mg Oral Daily  . calcitRIOL  0.5 mcg Oral Q M,W,F  . Chlorhexidine Gluconate Cloth  6 each Topical Daily  . Chlorhexidine Gluconate Cloth  6 each Topical Q0600  . Chlorhexidine Gluconate Cloth  6 each Topical Q0600  . dronabinol  2.5 mg Oral BID AC  . erythromycin  250 mg Oral TID AC  . feeding supplement (ENSURE ENLIVE)  237 mL Oral TID BM  . gabapentin  100 mg Oral TID  . insulin aspart  0-15 Units Subcutaneous TID WC  . insulin aspart  0-5 Units Subcutaneous QHS  . insulin glargine  5 Units Subcutaneous QHS  . lidocaine  1 patch Transdermal Q24H  . metoCLOPramide  10 mg Oral TID AC  . midodrine  5 mg Oral 3 times per day on Mon Wed Fri  . multivitamin  1 tablet Oral QHS  . sodium chloride flush  10-40 mL Intracatheter Q12H  . Warfarin - Pharmacist Dosing Inpatient   Does not apply q1800   . ceFEPime (MAXIPIME) IV Stopped (04/01/19 1821)  . heparin 1,000 Units/hr (04/02/19 0300)  . vancomycin Stopped (04/01/19 1355)   acetaminophen, docusate sodium, fentaNYL (SUBLIMAZE) injection, hydrALAZINE, LORazepam, ondansetron (ZOFRAN) IV, oxyCODONE, promethazine **OR** promethazine **OR** promethazine, sodium chloride flush, traMADol    Exam: Gen alert, no distress  No jvd or bruits Chest clear bilat to bases Cor mechanical heart  sounds Abd soft ntnd no mass or ascites +bs, hardware MS R foot w 4th toe amp, skin darkening in several areas of the foot Neuro alert and oriented x 3; conversant Access: left chest tunneled dialysis catheter intact     Home meds:  - aspirin 81/ atorvastatin 40 qd  - metoclopramide 5 tid/ erythromycin 250 tid/ dronabinol 2.5 bid  - warfarin daily  - tramadol qid prn/ citalopram 20 qd/ oxy IR  15m prn/ gabapentin 100 bid  - Novolog 7u hs/ insulin glargine 10u qd  - midodrine 5 mg pre HD tiw  - prn's/ vitamins/ supplements    Outpt HD: [facility] MWF  4h 14m   84kg  400/800   2K/3Ca bath  TDC   Hep none  - mircera 200uig last 7/22  - venofer 50/w  - hect 2 ug tiw   Assessment/ Plan: 1. Abd pain /nausea/ vomiting - h/o gastroparesis; per primary team  2. R foot ischemia - sp perc revasc RLE and R 4th toe amp (7/13, 7/16). Has some discoloration of the R foot. VVS following.  Per vascular note will eventually require below knee amputation  3. Anemia ABL/ CKD: transfusions as needed; sp 1u prbc; on outpatient mircera 200 ug (dosing is  every 2 weeks) and last received 200 ug on 7/22 4. ESRD: on HD MWF schedule.  Renal profile ordered  5. Hypotension/ volume: midodrine ^'d 5 tid per cards. No vol^ on exam 6. CHF/ severe ICM: s/p LVAD 2019 7. Chronic gastroparesis - on reglan, marinol, and erythromycin; per primary team  8. DM2 - per primary team    Iron/TIBC/Ferritin/ %Sat    Component Value Date/Time   IRON 40 (L) 11/02/2017 0016   TIBC 188 (L) 11/02/2017 0016   FERRITIN 523 (H) 11/02/2017 0016   IRONPCTSAT 21 11/02/2017 0016   Recent Labs  Lab 03/27/19 1351  04/01/19 0246 04/02/19 0409  NA 135   < > 135  --   K 4.0   < > 4.0  --   CL 95*   < > 95*  --   CO2 23   < > 27  --   GLUCOSE 114*   < > 186*  --   BUN 14   < > 47*  --   CREATININE 4.66*   < > 10.03*  --   CALCIUM 9.8   < > 8.4*  --   ALBUMIN 3.1*  --   --   --   INR 3.8*   < > 1.6* 1.4*   < > = values in  this interval not displayed.   Recent Labs  Lab 03/27/19 1351  AST 28  ALT 9  ALKPHOS 105  BILITOT 0.7  PROT 8.3*   Recent Labs  Lab 04/02/19 0345  WBC 18.0*  HGB 7.7*  HCT 24.9*  PLT 242    Claudia Desanctis 04/02/2019 6:37 AM

## 2019-04-02 NOTE — Progress Notes (Signed)
[place] [facility]ious [facility]   Reason for visit: Follow up on ? osteomyelitis  Interval History: back pain now resolved, has continued foot pain.  No fever but WBC up to 18.  No new complaints  Physical Exam: Constitutional:  Vitals:   04/02/19 0300 04/02/19 0813  BP: (!) 149/69 107/81  Pulse: 80 74  Resp: (!) 23 16  Temp: 98 F (36.7 C) 98.4 F (36.9 C)  SpO2: 99% 100%   patient appears in NAD Respiratory: Normal respiratory effort; CTA B Cardiovascular: RRR GI: soft, nt, nd MS: 4th right metatarsal with some bloody drainage but no pus.  Continue discoloration of toes  Review of Systems: Constitutional: negative for fevers and chills Gastrointestinal: negative for nausea and diarrhea Integument/breast: negative for rash  Lab Results  Component Value Date   WBC 18.0 (H) 04/02/2019   HGB 7.7 (L) 04/02/2019   HCT 24.9 (L) 04/02/2019   MCV 98.4 04/02/2019   PLT 242 04/02/2019    Lab Results  Component Value Date   CREATININE 10.03 (H) 04/01/2019   BUN 47 (H) 04/01/2019   NA 135 04/01/2019   K 4.0 04/01/2019   CL 95 (L) 04/01/2019   CO2 27 04/01/2019    Lab Results  Component Value Date   ALT 9 03/27/2019   AST 28 03/27/2019   ALKPHOS 105 03/27/2019     Microbiology: Recent Results (from the past 240 hour(s))  SARS Coronavirus 2 (CEPHEID- Performed in [place] hospital lab), Hosp Order     Status: None   Collection Time: 03/27/19  4:20 PM   Specimen: Nasopharyngeal Swab  Result Value Ref Range Status   SARS Coronavirus 2 NEGATIVE NEGATIVE Final    Comment: (NOTE) If result is NEGATIVE SARS-CoV-2 target nucleic acids are NOT DETECTED. The SARS-CoV-2 RNA is generally detectable in upper and lower  respiratory specimens during the acute phase of infection. The lowest  concentration of SARS-CoV-2 viral copies this assay can detect is 250  copies / mL. A negative result does not preclude SARS-CoV-2 infection  and should not be used as the  sole basis for treatment or other  patient management decisions.  A negative result may occur with  improper specimen collection / handling, submission of specimen other  than nasopharyngeal swab, presence of viral mutation(s) within the  areas targeted by this assay, and inadequate number of viral copies  (<250 copies / mL). A negative result must be combined with clinical  observations, patient history, and epidemiological information. If result is POSITIVE SARS-CoV-2 target nucleic acids are DETECTED. The SARS-CoV-2 RNA is generally detectable in upper and lower  respiratory specimens dur ing the acute phase of infection.  Positive  results are indicative of active infection with SARS-CoV-2.  Clinical  correlation with patient history and other diagnostic information is  necessary to determine patient infection status.  Positive results do  not rule out bacterial infection or co-infection with other viruses. If result is PRESUMPTIVE POSTIVE SARS-CoV-2 nucleic acids MAY BE PRESENT.   A presumptive positive result was obtained on the submitted specimen  and confirmed on repeat testing.  While 2019 novel coronavirus  (SARS-CoV-2) nucleic acids may be present in the submitted sample  additional confirmatory testing may be necessary for epidemiological  and / or clinical management purposes  to differentiate between  SARS-CoV-2 and other Sarbecovirus currently known to infect humans.  If clinically indicated additional testing with an alternate test  methodology [phone]) is advised. The SARS-CoV-2 RNA  is generally  detectable in upper and lower respiratory sp ecimens during the acute  phase of infection. The expected result is Negative. Fact Sheet for Patients:  StrictlyIdeas.no Fact Sheet for Healthcare Providers: BankingDealers.co.za This test is not yet approved or cleared by the [place] FDA and has been authorized for detection  and/or diagnosis of SARS-CoV-2 by FDA under an Emergency Use Authorization (EUA).  This EUA will remain in effect (meaning this test can be used) for the duration of the COVID-19 declaration under Section 564(b)(1) of the Act, 21 U.S.C. section 360bbb-3(b)(1), unless the authorization is terminated or revoked sooner. Performed at [facility], [place] [address], [place], [place] [address]   Culture, blood (routine x 2)     Status: None   Collection Time: 03/27/19  4:40 PM   Specimen: BLOOD  Result Value Ref Range Status   Specimen Description BLOOD WRIST RIGHT  Final   Special Requests   Final    BOTTLES DRAWN AEROBIC AND ANAEROBIC Blood Culture results may not be optimal due to an inadequate volume of blood received in culture bottles   Culture   Final    NO GROWTH 5 DAYS Performed at [facility], [place] [address] [place], [place] [address]    Report Status 04/01/2019 FINAL  Final  Culture, blood (routine x 2)     Status: None   Collection Time: 03/27/19  4:47 PM   Specimen: BLOOD  Result Value Ref Range Status   Specimen Description BLOOD LEFT WRIST  Final   Special Requests   Final    BOTTLES DRAWN AEROBIC ONLY Blood Culture results may not be optimal due to an inadequate volume of blood received in culture bottles   Culture   Final    NO GROWTH 5 DAYS Performed at [facility], [place] [address], [place], [place] [address]    Report Status 04/01/2019 FINAL  Final  MRSA PCR Screening     Status: None   Collection Time: 03/28/19  2:08 AM   Specimen: Nasopharyngeal  Result Value Ref Range Status   MRSA by PCR NEGATIVE NEGATIVE Final    Comment:        The GeneXpert MRSA Assay (FDA approved for NASAL specimens only), is one component of a comprehensive MRSA colonization surveillance program. It is not intended to diagnose MRSA infection nor to guide or monitor treatment for MRSA infections. Performed at [facility], [place] [address], [place],  [place] [address]     Impression/Plan:  1. Possible osteomyelitis - finding really equivocal but really no way to rule it out.  He has not history of gram negative organisms so I will opt to treat him for possible acute osteomyelitis for 4 weeks through August 19.  If he undergoes right leg amputation prior to that, antibiotics can be stopped. I will stop cefepime.   2. Back pain - resolved.  Occurred after dialysis.  Unrevealing work up.  3.  Right foot ischemia - very tender foot.  Continued discoloration.  Likely to need amputation per Dr. Donzetta Matters.

## 2019-04-03 DIAGNOSIS — M549 Dorsalgia, unspecified: Secondary | ICD-10-CM

## 2019-04-03 LAB — RENAL FUNCTION PANEL
Albumin: 2.3 g/dL — ABNORMAL LOW (ref 3.5–5.0)
Anion gap: 11 (ref 5–15)
BUN: 37 mg/dL — ABNORMAL HIGH (ref 6–20)
CO2: 23 mmol/L (ref 22–32)
Calcium: 8.2 mg/dL — ABNORMAL LOW (ref 8.9–10.3)
Chloride: 97 mmol/L — ABNORMAL LOW (ref 98–111)
Creatinine, Ser: 8.71 mg/dL — ABNORMAL HIGH (ref 0.61–1.24)
GFR calc Af Amer: 7 mL/min — ABNORMAL LOW (ref >60)
GFR calc non Af Amer: 6 mL/min — ABNORMAL LOW (ref >60)
Glucose, Bld: 95 mg/dL (ref 70–99)
Phosphorus: 4.3 mg/dL (ref 2.5–4.6)
Potassium: 4.7 mmol/L (ref 3.5–5.1)
Sodium: 131 mmol/L — ABNORMAL LOW (ref 135–145)

## 2019-04-03 LAB — GLUCOSE, CAPILLARY
Glucose-Capillary: 105 mg/dL — ABNORMAL HIGH (ref 70–99)
Glucose-Capillary: 122 mg/dL — ABNORMAL HIGH (ref 70–99)
Glucose-Capillary: 161 mg/dL — ABNORMAL HIGH (ref 70–99)
Glucose-Capillary: 117 mg/dL — ABNORMAL HIGH (ref 70–99)

## 2019-04-03 LAB — PROTIME-INR
INR: 1.6 — ABNORMAL HIGH (ref 0.8–1.2)
Prothrombin Time: 18.9 seconds — ABNORMAL HIGH (ref 11.4–15.2)

## 2019-04-03 LAB — CBC
HCT: 22.8 % — ABNORMAL LOW (ref 39.0–52.0)
Hemoglobin: 7.2 g/dL — ABNORMAL LOW (ref 13.0–17.0)
MCH: 30.4 pg (ref 26.0–34.0)
MCHC: 31.6 g/dL (ref 30.0–36.0)
MCV: 96.2 fL (ref 80.0–100.0)
Platelets: 259 10*3/uL (ref 150–400)
RBC: 2.37 MIL/uL — ABNORMAL LOW (ref 4.22–5.81)
RDW: 15.9 % — ABNORMAL HIGH (ref 11.5–15.5)
WBC: 16.9 10*3/uL — ABNORMAL HIGH (ref 4.0–10.5)
nRBC: 0.4 % — ABNORMAL HIGH (ref 0.0–0.2)

## 2019-04-03 LAB — VANCOMYCIN, RANDOM: Vancomycin Rm: 30

## 2019-04-03 LAB — PREPARE RBC (CROSSMATCH)

## 2019-04-03 LAB — HEPARIN LEVEL (UNFRACTIONATED)
Heparin Unfractionated: <0.1 IU/mL — ABNORMAL LOW (ref 0.30–0.70)
Heparin Unfractionated: 0.48 IU/mL (ref 0.30–0.70)

## 2019-04-03 LAB — LACTATE DEHYDROGENASE: LDH: 200 U/L — ABNORMAL HIGH (ref 98–192)

## 2019-04-03 MED ORDER — WARFARIN SODIUM 10 MG PO TABS
10.0000 mg | ORAL_TABLET | Freq: Once | ORAL | Status: AC
  Administered 2019-04-03: 10 mg via ORAL
  Filled 2019-04-03: qty 1

## 2019-04-03 MED ORDER — SODIUM CHLORIDE 0.9 % IV BOLUS
250.0000 mL | Freq: Once | INTRAVENOUS | Status: AC
  Administered 2019-04-03: 14:00:00 250 mL via INTRAVENOUS

## 2019-04-03 MED ORDER — SODIUM CHLORIDE 0.9% IV SOLUTION: Freq: Once | INTRAVENOUS | Status: DC

## 2019-04-03 NOTE — Progress Notes (Signed)
Patient ID: [name], male   DOB: 31-Jul-1966, 53 y.o.   MRN: [id number]   Advanced Heart Failure VAD Team Note  PCP-Cardiologist: No primary care provider on file.   Subjective:    Admitted with intractable N/V and R foot pain.   Nausea better, eating and has had a bowel movement.  Foot pain better.   WBCs 20 => 17 => 14 => 18 => 16.9, afebrile.  ESR 70.  Right foot film with possible osteomyelitis 4th metatarsal head.  Seen by ID, thought to be more likely reactive changes related to recent foot surgery but plan to continue vancomycin for 4 wks.   Acute severe sharp pain right flank 7/27 afternoon after HD.  CT abdomen/pelvis showed no acute abnormalities, no evidence for nephrolithiasis or discitis.  No RP hemorrhage.  In setting of severe pain, ?mental status changes and had CT head which was unremarkable.  Now, abdominal pain has resolved.   Seen at HD today, no problems so far.   INR 1.6 on heparin gtt.    LVAD INTERROGATION:  HeartMate 3 LVAD:   Flow 3.9 liters/min, speed 5500 , power 4.4, PI 3.8.    Objective:    Vital Signs:   Temp:  [97.7 F (36.5 C)-99.2 F (37.3 C)] 98.6 F (37 C) (07/29 0740) Pulse Rate:  [85-104] 90 (07/29 0900) Resp:  [15-18] 17 (07/29 0724) BP: (96-131)/(55-99) 114/55 (07/29 0900) SpO2:  [98 %-99 %] 99 % (07/29 0724) Weight:  [82.9 kg] 82.9 kg (07/29 0740) Last BM Date: 03/31/19 Mean arterial Pressure 90s this morning Intake/Output:   Intake/Output Summary (Last 24 hours) at 04/03/2019 0914 Last data filed at 04/03/2019 0724 Gross per 24 hour  Intake 583.38 ml  Output 225 ml  Net 358.38 ml     Physical Exam   General: Well appearing this am. NAD.  HEENT: Normal. Neck: Supple, JVP 7-8 cm. Carotids OK.  Cardiac:  Mechanical heart sounds with LVAD hum present.  Lungs:  CTAB, normal effort.  Abdomen:  NT, ND, no HSM. No bruits or masses. +BS  LVAD exit site: Well-healed and incorporated. Dressing dry and intact. No erythema or  drainage. Stabilization device present and accurately applied. Driveline dressing changed daily per sterile technique. Extremities:   No edema.  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  Tips of other toes on right foot are dark.    Neuro:  Alert & oriented x 3. Cranial nerves grossly intact. Moves all 4 extremities w/o difficulty. Affect pleasant     Telemetry   NSR 80s. Personally reviewed   EKG    N/a   Labs   Basic Metabolic Panel: Recent Labs  Lab 03/29/19 0337 03/30/19 0316 03/31/19 0325 04/01/19 0246 04/03/19 0445  NA 133* 133* 137 135 131*  K 4.2 3.7 3.7 4.0 4.7  CL 99 96* 97* 95* 97*  CO2 _0 GLUCOSE 195* 226* 176* 186* 95  BUN 43* 26* 38* 47* 37*  CREATININE 8.43* 5.73* 8.16* 10.03* 8.71*  CALCIUM 8.3* 8.3* 8.6* 8.4* 8.2*  PHOS  --   --   --   --  4.3    Liver Function Tests: Recent Labs  Lab 03/27/19 1351 04/03/19 0445  AST 28  --   ALT 9  --   ALKPHOS 105  --   BILITOT 0.7  --   PROT 8.3*  --   ALBUMIN 3.1* 2.3*   No results for input(s): LIPASE, AMYLASE in the last 168 hours.  No results for input(s): AMMONIA in the last 168 hours.  CBC: Recent Labs  Lab 03/27/19 1351  04/01/19 0246 04/01/19 1515 04/01/19 1824 04/02/19 0345 04/03/19 0445  WBC 17.1*   < > 14.1* 23.3* 23.4* 18.0* 16.9*  NEUTROABS 14.9*  --   --   --  19.9*  --   --   HGB 9.9*   < > 7.8* 8.5* 8.0* 7.7* 7.2*  HCT 31.5*   < > 23.9* 25.9* 25.3* 24.9* 22.8*  MCV 94.9   < > 94.8 93.2 95.8 98.4 96.2  PLT 322   < > 259 238 248 242 259   < > = values in this interval not displayed.    INR: Recent Labs  Lab 03/30/19 0316 03/31/19 0325 04/01/19 0246 04/02/19 0409 04/03/19 0445  INR 2.8* 1.9* 1.6* 1.4* 1.6*    Other results:    Imaging   Ct Abdomen Pelvis Wo Contrast  Result Date: 04/01/2019 CLINICAL DATA:  Sudden onset of severe low back and abdominal pain after starting heparin. Left ventricular assist device. EXAM: CT ABDOMEN AND PELVIS WITHOUT CONTRAST  TECHNIQUE: Multidetector CT imaging of the abdomen and pelvis was performed following the standard protocol without IV contrast. COMPARISON:  Chest radiograph 03/27/2019. Abdominopelvic CT 08/28/2018. FINDINGS: Examination was performed with the patient in the right lateral decubitus position. Despite efforts by the technologist and patient, mild motion artifact is present on today's exam and could not be eliminated. This reduces exam sensitivity and specificity. Lower chest: Similar subsegmental atelectasis at both lung bases and a small left pleural effusion. No basilar pneumothorax. The visualized portions of the left ventricular assist device are intact. Atherosclerosis of the aorta and coronary arteries noted. Hepatobiliary: The liver appears unremarkable as imaged in the noncontrast state. The gallbladder is incompletely distended. No evidence of gallstones, gallbladder wall thickening or biliary dilatation. Pancreas: Unremarkable. No pancreatic ductal dilatation or surrounding inflammatory changes. Spleen: Normal in size without focal abnormality. Adrenals/Urinary Tract: Both adrenal glands appear normal. The kidneys, ureters and bladder appear normal. No evidence of urinary tract calculus or hydronephrosis. Stomach/Bowel: No evidence of bowel wall thickening, distention or surrounding inflammatory change. Stable postsurgical changes from right hemicolectomy and anastomosis. Vascular/Lymphatic: There are no enlarged abdominal or pelvic lymph nodes. Moderate diffuse aortic and branch vessel atherosclerosis. Reproductive: The prostate gland and seminal vesicles appear normal. Other: No free air, ascites or intra-abdominal inflammatory changes. No evidence of peritoneal or retroperitoneal hemorrhage. Musculoskeletal: No acute or significant osseous findings. IMPRESSION: 1. No acute findings or explanation for the patient's symptoms. No evidence of intra-abdominal hemorrhage. 2. The visualized left ventricular  assist device appears unchanged. Chronic bibasilar atelectasis or scarring. 3.  Aortic Atherosclerosis (ICD10-I70.0). Electronically Signed   By: Richardean Sale M.D.   On: 04/01/2019 14:26   Ct Head Wo Contrast  Result Date: 04/02/2019 CLINICAL DATA:  Altered mental status EXAM: CT HEAD WITHOUT CONTRAST TECHNIQUE: Contiguous axial images were obtained from the base of the skull through the vertex without intravenous contrast. COMPARISON:  CT brain 08/22/2018 FINDINGS: Brain: No acute territorial infarction, hemorrhage or intracranial mass. Stable atrophy and mild small vessel ischemic changes of the white matter. Chronic lacunar infarct left cerebellum. Stable ventricle size. Vascular: No hyperdense vessels. Carotid vascular and vertebral artery calcification Skull: Normal. Negative for fracture or focal lesion. Sinuses/Orbits: Mucous retention cyst in the right frontal sinus. Mild mucosal thickening in the maxillary and ethmoid sinuses Other: None IMPRESSION: 1. No CT evidence for acute intracranial abnormality. 2. Atrophy  and minimal small vessel ischemic changes of the white matter. Electronically Signed   By: Donavan Foil M.D.   On: 04/02/2019 00:03     Medications:     Scheduled Medications: . sodium chloride   Intravenous Once  . aspirin EC  81 mg Oral Daily  . calcitRIOL  0.5 mcg Oral Q M,W,F  . Chlorhexidine Gluconate Cloth  6 each Topical Daily  . Chlorhexidine Gluconate Cloth  6 each Topical Q0600  . Chlorhexidine Gluconate Cloth  6 each Topical Q0600  . Chlorhexidine Gluconate Cloth  6 each Topical Q0600  . dronabinol  2.5 mg Oral BID AC  . erythromycin  250 mg Oral TID AC  . feeding supplement (ENSURE ENLIVE)  237 mL Oral TID BM  . gabapentin  100 mg Oral TID  . insulin aspart  0-15 Units Subcutaneous TID WC  . insulin aspart  0-5 Units Subcutaneous QHS  . insulin glargine  5 Units Subcutaneous QHS  . lidocaine  1 patch Transdermal Q24H  . metoCLOPramide  10 mg Oral TID AC   . midodrine  5 mg Oral 3 times per day on Mon Wed Fri  . multivitamin  1 tablet Oral QHS  . sodium chloride flush  10-40 mL Intracatheter Q12H  . Warfarin - Pharmacist Dosing Inpatient   Does not apply q1800    Infusions: . heparin 1,450 Units/hr (04/03/19 0020)  . vancomycin Stopped (04/01/19 1355)    PRN Medications: acetaminophen, docusate sodium, hydrALAZINE, LORazepam, ondansetron (ZOFRAN) IV, oxyCODONE, promethazine **OR** promethazine **OR** promethazine, sodium chloride flush, traMADol    Assessment/Plan:    1. PAD:  Peripheral angiogram 7/13 with 80% right SFA stenosis and occluded right PT.  He had stenting of SFA and PTCA of PT with restoration of flow.  However, he still needed amputation right 4th toe on 7/16 with intractable pain.  He was readmitted with pain in his right foot, nausea, and vomiting.  Pain somewhat better now but remaining toes on right foot are dark at the tips concerning for ischemia.  He also has significant unrevascularized disease in left leg.   ESR 70, concern for osteomyelitis at right 4th metatarsal head => seen by ID and thought to be most likely reactive changes post-surgery but plan to treat with vancomycin for 4 wks to 8/19.  WBCs elevated but afebrile.  Discussed with Dr. Donzetta Matters, suspect he will eventually need wider amputation in the future but does not seem emergent and can likely hold off for now.  - Will await demarcation of foot, to be seen by Dr. Donzetta Matters in office post-discharge.  - Continue vancomycin with HD x 4 wks to 04/24/19.  2. Chronic systolic CHF: Ischemic cardiomyopathy. [employer] ICD. NSTEMI in March 2018 with DES to LAD and RCA, complicated by cardiogenic shock, low output requiring milrinone. Echo 8/18 with EF 20-25%, moderate MR. CPX (8/18) with severe functional impairment due to HF. He is s/p HM3 LVAD placement. He had pump thrombosis 6/19 due to [facility] outflow graft kinking. He had pump exchange for another HM3. INR 1.6 today.  -  Volume managed at dialysis.  - Continue ASA 81 and warfarin with INR goal 2-2.5.  INR 1.6 today, continuing IV heparin until INR 1.8 (hopefully tomorrow).   - He has been [facility] for heart/kidney transplantand recently saw Dr Mosetta Pigeon => he may not be a candidate for transplant given extensive PAD.  3. ESRD: Developed peri-operatively after HM3 placement. He is tolerating HD, currently  via catheter as he has failed AV fistula and AV graft placement.  4. CAD: NSTEMI in 3/18. LHC with 99% ulcerated lesion proximal RCA with left to right collaterals, 95% mid LAD stenosis after mid LAD stent. s/p PCI to RCA and LAD on 11/25/16.  -Continue statin and ASA. 4. h/o DVTs: Factor V Leiden heterozygote. -Anticoagulated. 5. Diabetic gastroparesis: Difficult to manage. He has seen gastroparesis specialist at [facility]. Surprisingly, gastric emptying study was normal. However, electrogastrogram showed poor gastric accomodation/capacity. Therefore, small frequent meals recommended.Had nausea/vomiting this admission, narcotics likely making this worse with constipation. He is feeling better and eating, has had BM.  - Continue reglan 10 mg tid/ac and Marinol.  - Continue Protonix bid.  - He will continue erythromycinindefinitely.  - Limit narcotic use as much as possible.  6. VQ:QVZDG with SSI and home glargine. 7. HTN:MAP has been lower with dialysis, now off Bidil and getting midodrine prior to HD. 8. Atrial flutter: Paroxysmal. He has been in NSR recently.  - Continue amiodarone. 9. Left hand tingling and grip weakness: Present since he developed left arm hematoma from failed AV graft. 10. ID: WBCs up but no fever.  As above, right floot plain film concerning for osteomyelitis right 4th metatarsal head.  ESR 70. However, seen by ID and think most likely reactive post-surgery and less likely infectious.  ID has recommended vancomycin to continue for 4 wks through  04/24/19.  - Continue vancomycin with HD. 11. Right flank pain: Severe, sharp 7/27 afternoon.  CT abdomen/pelvis showed no acute finding to explain pain.  No evidence for nephrolithiasis, RP hemorrhage, discitis, fecal impaction.  Pain now resolved.  ?Related to fluid shifts with HD as it started after HD. No recurrent pain today.   He can go home when INR > 1.8, probably tomorrow.   I reviewed the LVAD parameters from today, and compared the results to the patient's prior recorded data.  No programming changes were made.  The LVAD is functioning within specified parameters.  The patient performs LVAD self-test daily.  LVAD interrogation was + 3 low flows. LVAD equipment check completed and is in good working order.  Back-up equipment present.   LVAD education done on emergency procedures and precautions and reviewed exit site care.  Length of Stay: [place], MD 04/03/2019, 9:14 AM  VAD Team --- VAD ISSUES ONLY--- Pager [phone] (7am - 7am)  Advanced Heart Failure Team  Pager [phone] (M-F; 7a - 4p)  Please contact [facility] for night-coverage after hours (4p -7a ) and weekends on [email or web address]

## 2019-04-03 NOTE — Progress Notes (Signed)
  Having ongoing pain R flank after HD. Similar to the episode he had on Monday.He was given ultram and oxy ir with little relief. Standing at the bedside complaining of R flank plain 7/10.   Had CT abd/pelvis on 7/27 that did not show any acute findings. At that time fentanyl was discontinued.   Discussed with nephrology, Dr Royce Macadamia and will give 250 cc NS bolus now. He was also given 0.5 mg ativan.   Sadako Cegielski NP-C  2:08 PM

## 2019-04-03 NOTE — Progress Notes (Signed)
Pharmacy Antibiotic Note  [name] is a 53 y.o. male with LVAD admitted on 7/22/2020with foot pain and worsening foot infection.  Started on Vancomycin and Cefepime for broad coverage.  WBC down 17, afebrile, wound improving,VR prior to HD 30.  Plan: Continue Vancomycin 1gm IV qHD MWF Cefepime stopped 7/28  Height: 5' 7.01" (170.2 cm) Weight: 177 lb 4 oz (80.4 kg) IBW/kg (Calculated) : 66.12  Temp (24hrs), Avg:98.7 F (37.1 C), Min:97.7 F (36.5 C), Max:99.4 F (37.4 C)  Recent Labs  Lab 03/27/19 1640  03/28/19 0318  03/29/19 0337 03/30/19 0316 03/31/19 0325 04/01/19 0246 04/01/19 1515 04/01/19 1824 04/02/19 0345 04/03/19 0445  WBC  --    < > 18.6*   < > 18.9* 20.0* 17.0* 14.1* 23.3* 23.4* 18.0* 16.9*  CREATININE  --    < > 6.47*  --  8.43* 5.73* 8.16* 10.03*  --   --   --  8.71*  LATICACIDVEN 1.4  --  1.1  --   --   --   --   --   --   --   --   --   VANCORANDOM  --   --   --   --   --   --   --   --   --   --   --  30   < > = values in this interval not displayed.    Estimated Creatinine Clearance: 9.8 mL/min (A) (by C-G formula based on SCr of 8.71 mg/dL (H)).    Allergies  Allergen Reactions  . Metformin And Related Diarrhea    Antimicrobials this admission: Vanc 7/23 >>  Cefepime 7/23 >> 7/28  Dose adjustments this admission:  Microbiology results:  7/22 BCx: NGTD 7/22 COVID: neg  7/23 MRSA PCR: neg   Bonnita Nasuti Pharm.D. CPP, BCPS Clinical Pharmacist [phone] 04/03/2019 2:52 PM

## 2019-04-03 NOTE — Progress Notes (Signed)
[facility] Progress Note  Subjective:    Last HD on 7/27 with 2.4 kg UF.  We discussed his labs and risks/benefits, and indications of blood transfuion and he is willing to get a unit of blood today.   Review of systems   Denies chest pain or shortness of breath Denies nausea or vomiting  Denies abdominal pain   Vitals:   04/02/19 1942 04/02/19 2344 04/03/19 0513 04/03/19 0639  BP: (!) 96/56 105/74 (!) 125/96 105/83  Pulse: 92 87 86   Resp: 16 18 16 15   Temp: 98.8 F (37.1 C) 98.4 F (36.9 C) 97.7 F (36.5 C)   TempSrc: Oral Oral Oral   SpO2: 98% 98% 98%   Weight:   82.9 kg   Height:        Inpatient medications: . aspirin EC  81 mg Oral Daily  . calcitRIOL  0.5 mcg Oral Q M,W,F  . Chlorhexidine Gluconate Cloth  6 each Topical Daily  . Chlorhexidine Gluconate Cloth  6 each Topical Q0600  . Chlorhexidine Gluconate Cloth  6 each Topical Q0600  . Chlorhexidine Gluconate Cloth  6 each Topical Q0600  . dronabinol  2.5 mg Oral BID AC  . erythromycin  250 mg Oral TID AC  . feeding supplement (ENSURE ENLIVE)  237 mL Oral TID BM  . gabapentin  100 mg Oral TID  . insulin aspart  0-15 Units Subcutaneous TID WC  . insulin aspart  0-5 Units Subcutaneous QHS  . insulin glargine  5 Units Subcutaneous QHS  . lidocaine  1 patch Transdermal Q24H  . metoCLOPramide  10 mg Oral TID AC  . midodrine  5 mg Oral 3 times per day on Mon Wed Fri  . multivitamin  1 tablet Oral QHS  . sodium chloride flush  10-40 mL Intracatheter Q12H  . Warfarin - Pharmacist Dosing Inpatient   Does not apply q1800   . heparin 1,450 Units/hr (04/03/19 0020)  . vancomycin Stopped (04/01/19 1355)   acetaminophen, docusate sodium, hydrALAZINE, LORazepam, ondansetron (ZOFRAN) IV, oxyCODONE, promethazine **OR** promethazine **OR** promethazine, sodium chloride flush, traMADol    Exam: Gen alert adult male in no distress No jvd or bruits Chest clear bilat to bases Cor mechanical heart  sounds Abd soft ntnd no mass or ascites +bs  MS R foot skin darkening in several areas of the foot Neuro alert and oriented x 3; conversant Access: left chest tunneled dialysis catheter intact     Home meds:  - aspirin 81/ atorvastatin 40 qd  - metoclopramide 5 tid/ erythromycin 250 tid/ dronabinol 2.5 bid  - warfarin daily  - tramadol qid prn/ citalopram 20 qd/ oxy IR  63m prn/ gabapentin 100 bid  - Novolog 7u hs/ insulin glargine 10u qd  - midodrine 5 mg pre HD tiw  - prn's/ vitamins/ supplements    Outpt HD: [facility] MWF  4h 167m   84kg  400/800   2K/3Ca bath  TDC   Hep none  - mircera 200uig last 7/22  - venofer 50/w  - hect 2 ug tiw   Assessment/ Plan:  1. Abd pain /nausea/ vomiting - symptoms resolved per pt; h/o gastroparesis; per primary team  2. R foot ischemia - sp perc revasc RLE and R 4th toe amp (7/13, 7/16). Has some discoloration of the R foot. VVS following.  Per vascular note he is high risk for transtibial amputation in the future.  cleared from vascular standpoint for discharge.     3. ESRD:  on HD MWF schedule.   4. Anemia ABL/ CKD: transfusions as needed; sp 1u prbc; on outpatient mircera 200 ug (dosing is every 2 weeks) and last received 200 ug on 7/22.  PRBC's today with HD x 1 unit 5. Hypotension/ volume: midodrine ^'d 5 tid per cards. No vol^ on exam 6. CHF/ severe ICM: s/p LVAD 2019.  On anticoagulation and heparin gtt 7. Chronic gastroparesis - on reglan, marinol, and erythromycin; per primary team  8. DM2 - per primary team    Iron/TIBC/Ferritin/ %Sat    Component Value Date/Time   IRON 40 (L) 11/02/2017 0016   TIBC 188 (L) 11/02/2017 0016   FERRITIN 523 (H) 11/02/2017 0016   IRONPCTSAT 21 11/02/2017 0016   Recent Labs  Lab 04/03/19 0445  NA 131*  K 4.7  CL 97*  CO2 23  GLUCOSE 95  BUN 37*  CREATININE 8.71*  CALCIUM 8.2*  PHOS 4.3  ALBUMIN 2.3*  INR 1.6*   Recent Labs  Lab 03/27/19 1351  AST 28  ALT 9  ALKPHOS 105  BILITOT 0.7   PROT 8.3*   Recent Labs  Lab 04/03/19 0445  WBC 16.9*  HGB 7.2*  HCT 22.8*  PLT 259    Claudia Desanctis 04/03/2019 7:13 AM

## 2019-04-03 NOTE — Progress Notes (Signed)
ANTICOAGULATION CONSULT NOTE: Follow-up  Pharmacy Consult for heparin Indication: LVAD  Allergies  Allergen Reactions  . Metformin And Related Diarrhea    Patient Measurements: Height: 5' 7.01" (170.2 cm) Weight: 193 lb 9 oz (87.8 kg) IBW/kg (Calculated) : 66.12 Heparin Dosing Weight: 80kg  Vital Signs: Temp: 98.4 F (36.9 C) (07/28 2344) Temp Source: Oral (07/28 2344) BP: 105/74 (07/28 2344) Pulse Rate: 87 (07/28 2344)  Labs: Recent Labs    03/31/19 0325 04/01/19 0246  04/01/19 1515 04/01/19 1824 04/02/19 0345 04/02/19 0409 04/02/19 1620 04/02/19 2300  HGB 8.2* 7.8*  --  8.5* 8.0* 7.7*  --   --   --   HCT 25.5* 23.9*  --  25.9* 25.3* 24.9*  --   --   --   PLT 263 259  --  238 248 242  --   --   --   LABPROT 21.1* 18.9*  --   --   --   --  16.9*  --   --   INR 1.9* 1.6*  --   --   --   --  1.4*  --   --   HEPARINUNFRC  --   --    < >  --   --   --  0.11* <0.10* <0.10*  CREATININE 8.16* 10.03*  --   --   --   --   --   --   --    < > = values in this interval not displayed.    Estimated Creatinine Clearance: 8.9 mL/min (A) (by C-G formula based on SCr of 10.03 mg/dL (H)).  c Infusions:  . heparin 1,250 Units/hr (04/02/19 1620)  . vancomycin Stopped (04/01/19 1355)    Assessment: 53yo male on Coumadin for LVAD, admitted for foot pain/PAD, now w/ INR below goal, to start heparin bridge.  Patient receiving heparin infusion in central line. HL was 0.11 (subtherapeutic) on 1000 units/hr which was increased to 1250 units/hr earlier today. The nurse found the heparin infusion off for an undetermined amount of time this evening, HL was found to be <0.10.   Heparin level now remains <0.1 units/ml.  No issues with infusion noted  Goal of Therapy:  Heparin level 0.3-0.7 units/ml Monitor platelets by anticoagulation protocol: Yes   Plan:  Increase heparin infusion to 1450 units/hr to target goal ~0.3 Recheck HL in 8 hrs Monitor daily HL, CBC, and for s/sx of  bleeding  Thanks for allowing pharmacy to be a part of this patient's care.  Excell Seltzer, PharmD Clinical Pharmacist

## 2019-04-03 NOTE — Progress Notes (Signed)
ANTICOAGULATION CONSULT NOTE: Follow-up  Pharmacy Consult for heparin Indication: LVAD  Allergies  Allergen Reactions  . Metformin And Related Diarrhea    Patient Measurements: Height: 5' 7.01" (170.2 cm) Weight: 177 lb 4 oz (80.4 kg) IBW/kg (Calculated) : 66.12 Heparin Dosing Weight: 80kg  Vital Signs: Temp: 99.1 F (37.3 C) (07/29 1242) Temp Source: Oral (07/29 1242) BP: 119/88 (07/29 1242) Pulse Rate: 87 (07/29 1242)  Labs: Recent Labs    04/01/19 0246  04/01/19 1824 04/02/19 0345 04/02/19 0409 04/02/19 1620 04/02/19 2300 04/03/19 0445 04/03/19 1406  HGB 7.8*   < > 8.0* 7.7*  --   --   --  7.2*  --   HCT 23.9*   < > 25.3* 24.9*  --   --   --  22.8*  --   PLT 259   < > 248 242  --   --   --  259  --   LABPROT 18.9*  --   --   --  16.9*  --   --  18.9*  --   INR 1.6*  --   --   --  1.4*  --   --  1.6*  --   HEPARINUNFRC  --    < >  --   --  0.11* <0.10* <0.10*  --  0.48  CREATININE 10.03*  --   --   --   --   --   --  8.71*  --    < > = values in this interval not displayed.    Estimated Creatinine Clearance: 9.8 mL/min (A) (by C-G formula based on SCr of 8.71 mg/dL (H)).  c Infusions:  . heparin 1,450 Units/hr (04/03/19 1026)  . vancomycin 1,000 mg (04/03/19 1236)    Assessment: 53yo male on Coumadin for LVAD, admitted for foot pain/PAD, now w/ INR below goal, to start heparin bridge.  Patient receiving heparin infusion 1450 uts/hr  in central line. HL was 0.48 at goal.  No issues with infusion noted Of note peripheral stick for HL seems to provide more accurate HL than stopping infusion and flushing and drawing from CL as done 7/28. INR 1.4 will give boost for hopeful DC soon  Goal of Therapy:  INR 2-3 Heparin level 0.3-0.7 units/ml Monitor platelets by anticoagulation protocol: Yes   Plan:  Continue heparin infusion 1450 units/hr to target goal ~0.3 Warfarin 63m x1  Monitor daily Protime, HL, CBC, and for s/sx of bleeding   LBonnita Nasuti Pharm.D. CPP, BCPS Clinical Pharmacist [phone]/29/2020 2:46 PM

## 2019-04-03 NOTE — Progress Notes (Signed)
[facility] Infectious [facility]  Date of Admission:  03/27/2019     Total days of antibiotics 7         ASSESSMENT/PLAN  Mr. Barberi is a 53 year old male admitted with nausea/vomiting as well as right foot pain with x-rays showing new cortical indistinctness of the fourth metatarsal suspicious for osteomyelitis.  Nausea and vomiting have resolved as well as most recent acute back pain of unclear origin.  Currently on vancomycin.  There is concern for circulation and ability for foot to heal with possible need for amputation.  Possible osteomyelitis of the fourth metatarsal -no definitive osteomyelitis however given LVAD it appears wise to treat him for osteomyelitis with goal of 4 weeks through August 19 with dialysis.  Continue current dose of vancomycin.  Will follow-up with vascular surgery for wound monitoring.  Right foot ischemia -adequate blood flow today and followed by vascular surgery.  End-stage renal disease on dialysis -receiving dialysis today with no complications.  Continue management per nephrology  Acute onset back pain - Unclear origin and resolved with no further symptoms.  ID will be available as needed for the remainder of hospitalization.   Active Problems:   Nausea and vomiting in adult   Gastroparesis   ESRD on dialysis (HCC)   Chronic systolic CHF (congestive heart failure) (HCC)   LVAD (left ventricular assist device) present (HCC)   Nausea & vomiting   Ischemic foot   . sodium chloride   Intravenous Once  . aspirin EC  81 mg Oral Daily  . calcitRIOL  0.5 mcg Oral Q M,W,F  . Chlorhexidine Gluconate Cloth  6 each Topical Daily  . Chlorhexidine Gluconate Cloth  6 each Topical Q0600  . Chlorhexidine Gluconate Cloth  6 each Topical Q0600  . Chlorhexidine Gluconate Cloth  6 each Topical Q0600  . dronabinol  2.5 mg Oral BID AC  . erythromycin  250 mg Oral TID AC  . feeding supplement (ENSURE ENLIVE)  237 mL Oral TID BM  . gabapentin  100 mg  Oral TID  . insulin aspart  0-15 Units Subcutaneous TID WC  . insulin aspart  0-5 Units Subcutaneous QHS  . insulin glargine  5 Units Subcutaneous QHS  . lidocaine  1 patch Transdermal Q24H  . metoCLOPramide  10 mg Oral TID AC  . midodrine  5 mg Oral 3 times per day on Mon Wed Fri  . multivitamin  1 tablet Oral QHS  . sodium chloride flush  10-40 mL Intracatheter Q12H  . Warfarin - Pharmacist Dosing Inpatient   Does not apply q1800    SUBJECTIVE:  Afebrile overnight with no new concerns. Back pain is resolved. Seen while receiving dialysis today.   Allergies  Allergen Reactions  . Metformin And Related Diarrhea     Review of Systems: Review of Systems  Constitutional: Negative for chills, fever and weight loss.  Respiratory: Negative for cough, shortness of breath and wheezing.   Cardiovascular: Negative for chest pain and leg swelling.  Gastrointestinal: Negative for abdominal pain, constipation, diarrhea, nausea and vomiting.  Skin: Negative for rash.      OBJECTIVE: Vitals:   04/03/19 1000 04/03/19 1019 04/03/19 1030 04/03/19 1100  BP: (!) 92/47 (!) 96/49 (!) (P) 82/39 (!) (P) 98/47  Pulse: 100 (!) 104 (P) 95 (P) 86  Resp:  18    Temp:  99.1 F (37.3 C)    TempSrc:  Oral    SpO2:  98%    Weight:  Height:       Body mass index is 28.62 kg/m.  Physical Exam Constitutional:      General: He is not in acute distress.    Appearance: He is well-developed.     Comments: Lying in bed with head of bed elevated; pleasant.   Cardiovascular:     Comments: LVAD hum present.  Pulmonary:     Effort: Pulmonary effort is normal.     Breath sounds: Normal breath sounds.  Skin:    General: Skin is warm and dry.  Neurological:     Mental Status: He is alert.  Psychiatric:        Mood and Affect: Mood normal.        Thought Content: Thought content normal.        Judgment: Judgment normal.     Lab Results Lab Results  Component Value Date   WBC 16.9 (H)  04/03/2019   HGB 7.2 (L) 04/03/2019   HCT 22.8 (L) 04/03/2019   MCV 96.2 04/03/2019   PLT 259 04/03/2019    Lab Results  Component Value Date   CREATININE 8.71 (H) 04/03/2019   BUN 37 (H) 04/03/2019   NA 131 (L) 04/03/2019   K 4.7 04/03/2019   CL 97 (L) 04/03/2019   CO2 23 04/03/2019    Lab Results  Component Value Date   ALT 9 03/27/2019   AST 28 03/27/2019   ALKPHOS 105 03/27/2019   BILITOT 0.7 03/27/2019     Microbiology: Recent Results (from the past 240 hour(s))  SARS Coronavirus 2 (CEPHEID- Performed in [place] hospital lab), Hosp Order     Status: None   Collection Time: 03/27/19  4:20 PM   Specimen: Nasopharyngeal Swab  Result Value Ref Range Status   SARS Coronavirus 2 NEGATIVE NEGATIVE Final    Comment: (NOTE) If result is NEGATIVE SARS-CoV-2 target nucleic acids are NOT DETECTED. The SARS-CoV-2 RNA is generally detectable in upper and lower  respiratory specimens during the acute phase of infection. The lowest  concentration of SARS-CoV-2 viral copies this assay can detect is 250  copies / mL. A negative result does not preclude SARS-CoV-2 infection  and should not be used as the sole basis for treatment or other  patient management decisions.  A negative result may occur with  improper specimen collection / handling, submission of specimen other  than nasopharyngeal swab, presence of viral mutation(s) within the  areas targeted by this assay, and inadequate number of viral copies  (<250 copies / mL). A negative result must be combined with clinical  observations, patient history, and epidemiological information. If result is POSITIVE SARS-CoV-2 target nucleic acids are DETECTED. The SARS-CoV-2 RNA is generally detectable in upper and lower  respiratory specimens dur ing the acute phase of infection.  Positive  results are indicative of active infection with SARS-CoV-2.  Clinical  correlation with patient history and other diagnostic information is   necessary to determine patient infection status.  Positive results do  not rule out bacterial infection or co-infection with other viruses. If result is PRESUMPTIVE POSTIVE SARS-CoV-2 nucleic acids MAY BE PRESENT.   A presumptive positive result was obtained on the submitted specimen  and confirmed on repeat testing.  While 2019 novel coronavirus  (SARS-CoV-2) nucleic acids may be present in the submitted sample  additional confirmatory testing may be necessary for epidemiological  and / or clinical management purposes  to differentiate between  SARS-CoV-2 and other Sarbecovirus currently known to infect humans.  If clinically indicated additional testing with an alternate test  methodology [phone]) is advised. The SARS-CoV-2 RNA is generally  detectable in upper and lower respiratory sp ecimens during the acute  phase of infection. The expected result is Negative. Fact Sheet for Patients:  StrictlyIdeas.no Fact Sheet for Healthcare Providers: BankingDealers.co.za This test is not yet approved or cleared by the [place] FDA and has been authorized for detection and/or diagnosis of SARS-CoV-2 by FDA under an Emergency Use Authorization (EUA).  This EUA will remain in effect (meaning this test can be used) for the duration of the COVID-19 declaration under Section 564(b)(1) of the Act, 21 U.S.C. section 360bbb-3(b)(1), unless the authorization is terminated or revoked sooner. Performed at [facility], [place] [address], [place], [place] [address]   Culture, blood (routine x 2)     Status: None   Collection Time: 03/27/19  4:40 PM   Specimen: BLOOD  Result Value Ref Range Status   Specimen Description BLOOD WRIST RIGHT  Final   Special Requests   Final    BOTTLES DRAWN AEROBIC AND ANAEROBIC Blood Culture results may not be optimal due to an inadequate volume of blood received in culture bottles   Culture   Final    NO GROWTH 5  DAYS Performed at [facility], [place] [address], [place], [place] [address]    Report Status 04/01/2019 FINAL  Final  Culture, blood (routine x 2)     Status: None   Collection Time: 03/27/19  4:47 PM   Specimen: BLOOD  Result Value Ref Range Status   Specimen Description BLOOD LEFT WRIST  Final   Special Requests   Final    BOTTLES DRAWN AEROBIC ONLY Blood Culture results may not be optimal due to an inadequate volume of blood received in culture bottles   Culture   Final    NO GROWTH 5 DAYS Performed at [facility], [place] [address] [place], [place] [address]    Report Status 04/01/2019 FINAL  Final  MRSA PCR Screening     Status: None   Collection Time: 03/28/19  2:08 AM   Specimen: Nasopharyngeal  Result Value Ref Range Status   MRSA by PCR NEGATIVE NEGATIVE Final    Comment:        The GeneXpert MRSA Assay (FDA approved for NASAL specimens only), is one component of a comprehensive MRSA colonization surveillance program. It is not intended to diagnose MRSA infection nor to guide or monitor treatment for MRSA infections. Performed at [facility], [place] [address] [place], [place] [address]      Terri Piedra, [facility] [phone] Pager  04/03/2019  12:00 PM

## 2019-04-03 NOTE — Plan of Care (Signed)
  Problem: Education: Goal: Patient will understand all VAD equipment and how it functions Outcome: Progressing Goal: Patient will be able to verbalize current INR target range and antiplatelet therapy for discharge home Outcome: Progressing   Problem: Cardiac: Goal: LVAD will function as expected and patient will experience no clinical alarms Outcome: Progressing   Problem: Education: Goal: Knowledge of General Education information will improve Description: Including pain rating scale, medication(s)/side effects and non-pharmacologic comfort measures Outcome: Progressing   Problem: Health Behavior/Discharge Planning: Goal: Ability to manage health-related needs will improve Outcome: Progressing   Problem: Clinical Measurements: Goal: Ability to maintain clinical measurements within normal limits will improve Outcome: Progressing Goal: Will remain free from infection Outcome: Progressing Goal: Diagnostic test results will improve Outcome: Progressing Goal: Respiratory complications will improve Outcome: Progressing Goal: Cardiovascular complication will be avoided Outcome: Progressing   Problem: Activity: Goal: Risk for activity intolerance will decrease Outcome: Progressing   Problem: Nutrition: Goal: Adequate nutrition will be maintained Outcome: Progressing   Problem: Coping: Goal: Level of anxiety will decrease Outcome: Progressing   Problem: Elimination: Goal: Will not experience complications related to bowel motility Outcome: Progressing Goal: Will not experience complications related to urinary retention Outcome: Progressing   Problem: Pain Managment: Goal: General experience of comfort will improve Outcome: Progressing   Problem: Safety: Goal: Ability to remain free from injury will improve Outcome: Progressing   Problem: Skin Integrity: Goal: Risk for impaired skin integrity will decrease Outcome: Progressing

## 2019-04-03 NOTE — Progress Notes (Signed)
   Doing well currently on dialysis.  There is one area of blistering on the distal lateral aspect of the right foot.  His foot is actually relatively warm today.  The fourth toe amputation site peers to be healing well.  Remains high risk for leg amputation although will allowed to demarcate.  He is scheduled for follow-up on August 14 for wound check.  Tranice Laduke C. Donzetta Matters, MD [facility] Office: [phone] Pager: [phone]

## 2019-04-04 ENCOUNTER — Encounter (HOSPITAL_COMMUNITY): Payer: BC Managed Care – PPO

## 2019-04-04 LAB — RENAL FUNCTION PANEL
Albumin: 2.3 g/dL — ABNORMAL LOW (ref 3.5–5.0)
Anion gap: 11 (ref 5–15)
BUN: 23 mg/dL — ABNORMAL HIGH (ref 6–20)
CO2: 25 mmol/L (ref 22–32)
Calcium: 8.3 mg/dL — ABNORMAL LOW (ref 8.9–10.3)
Chloride: 96 mmol/L — ABNORMAL LOW (ref 98–111)
Creatinine, Ser: 6.22 mg/dL — ABNORMAL HIGH (ref 0.61–1.24)
GFR calc Af Amer: 11 mL/min — ABNORMAL LOW (ref >60)
GFR calc non Af Amer: 9 mL/min — ABNORMAL LOW (ref >60)
Glucose, Bld: 97 mg/dL (ref 70–99)
Phosphorus: 4.3 mg/dL (ref 2.5–4.6)
Potassium: 4.3 mmol/L (ref 3.5–5.1)
Sodium: 132 mmol/L — ABNORMAL LOW (ref 135–145)

## 2019-04-04 LAB — CBC
HCT: 26.8 % — ABNORMAL LOW (ref 39.0–52.0)
Hemoglobin: 8.6 g/dL — ABNORMAL LOW (ref 13.0–17.0)
MCH: 31.2 pg (ref 26.0–34.0)
MCHC: 32.1 g/dL (ref 30.0–36.0)
MCV: 97.1 fL (ref 80.0–100.0)
Platelets: 248 10*3/uL (ref 150–400)
RBC: 2.76 MIL/uL — ABNORMAL LOW (ref 4.22–5.81)
RDW: 16 % — ABNORMAL HIGH (ref 11.5–15.5)
WBC: 16.5 10*3/uL — ABNORMAL HIGH (ref 4.0–10.5)
nRBC: 0.1 % (ref 0.0–0.2)

## 2019-04-04 LAB — GLUCOSE, CAPILLARY: Glucose-Capillary: 103 mg/dL — ABNORMAL HIGH (ref 70–99)

## 2019-04-04 LAB — TYPE AND SCREEN
ABO/RH(D): O POS
Antibody Screen: NEGATIVE
Unit division: 0

## 2019-04-04 LAB — PROTIME-INR
INR: 2 — ABNORMAL HIGH (ref 0.8–1.2)
Prothrombin Time: 22 seconds — ABNORMAL HIGH (ref 11.4–15.2)

## 2019-04-04 LAB — LACTATE DEHYDROGENASE: LDH: 214 U/L — ABNORMAL HIGH (ref 98–192)

## 2019-04-04 LAB — BPAM RBC
Blood Product Expiration Date: 202008242359
ISSUE DATE / TIME: 202007291014
Unit Type and Rh: 5100

## 2019-04-04 LAB — HEPARIN LEVEL (UNFRACTIONATED): Heparin Unfractionated: 0.43 IU/mL (ref 0.30–0.70)

## 2019-04-04 MED ORDER — VANCOMYCIN HCL IN DEXTROSE 1-5 GM/200ML-% IV SOLN: 1000.0000 mg | INTRAVENOUS | 0 refills | Status: AC

## 2019-04-04 MED ORDER — WARFARIN SODIUM 5 MG PO TABS: 5.0000 mg | ORAL_TABLET | ORAL | Status: DC

## 2019-04-04 MED ORDER — MIDODRINE HCL 5 MG PO TABS: 5.0000 mg | ORAL_TABLET | Freq: Every day | ORAL | 6 refills | Status: DC

## 2019-04-04 MED ORDER — WARFARIN SODIUM 7.5 MG PO TABS: 7.5000 mg | ORAL_TABLET | ORAL | Status: DC

## 2019-04-04 NOTE — Plan of Care (Signed)
  Problem: Education: Goal: Patient will understand all VAD equipment and how it functions Outcome: Completed/Met Goal: Patient will be able to verbalize current INR target range and antiplatelet therapy for discharge home Outcome: Completed/Met   Problem: Cardiac: Goal: LVAD will function as expected and patient will experience no clinical alarms Outcome: Completed/Met   Problem: Education: Goal: Knowledge of General Education information will improve Description: Including pain rating scale, medication(s)/side effects and non-pharmacologic comfort measures Outcome: Completed/Met   Problem: Health Behavior/Discharge Planning: Goal: Ability to manage health-related needs will improve Outcome: Completed/Met   Problem: Clinical Measurements: Goal: Ability to maintain clinical measurements within normal limits will improve Outcome: Completed/Met Goal: Will remain free from infection Outcome: Completed/Met Goal: Diagnostic test results will improve Outcome: Completed/Met Goal: Respiratory complications will improve Outcome: Completed/Met Goal: Cardiovascular complication will be avoided Outcome: Completed/Met   Problem: Activity: Goal: Risk for activity intolerance will decrease Outcome: Completed/Met   Problem: Nutrition: Goal: Adequate nutrition will be maintained Outcome: Completed/Met   Problem: Coping: Goal: Level of anxiety will decrease Outcome: Completed/Met   Problem: Elimination: Goal: Will not experience complications related to bowel motility Outcome: Completed/Met Goal: Will not experience complications related to urinary retention Outcome: Completed/Met   Problem: Pain Managment: Goal: General experience of comfort will improve Outcome: Completed/Met   Problem: Safety: Goal: Ability to remain free from injury will improve Outcome: Completed/Met   Problem: Skin Integrity: Goal: Risk for impaired skin integrity will decrease Outcome: Completed/Met

## 2019-04-04 NOTE — Progress Notes (Signed)
[facility] Progress Note  Subjective:   Last HD on 7/29 with 2 kg UF.  Received 1 unit PRBC's on 7/29.  Had right flank pain again yesterday.  States this happened after he had gotten back to his room and had eaten part of his meal.  We gave minibolus 250 mL and he thinks this may have helped.  Pain is gone now.  He thinks he may go home today and note INR 2.0.  Review of systems   Denies chest pain or shortness of breath Denies nausea or vomiting  Denies current abdominal pain   Vitals:   04/03/19 1957 04/03/19 2003 04/03/19 2346 04/04/19 0504  BP: 107/81 107/81 104/72 110/90  Pulse:  75 81 76  Resp:  19 16 14   Temp:  98.3 F (36.8 C) 98.8 F (37.1 C) 97.9 F (36.6 C)  TempSrc:  Oral Oral Oral  SpO2:  97% 97%   Weight:    85 kg  Height:        Inpatient medications: . sodium chloride   Intravenous Once  . aspirin EC  81 mg Oral Daily  . calcitRIOL  0.5 mcg Oral Q M,W,F  . Chlorhexidine Gluconate Cloth  6 each Topical Daily  . Chlorhexidine Gluconate Cloth  6 each Topical Q0600  . Chlorhexidine Gluconate Cloth  6 each Topical Q0600  . Chlorhexidine Gluconate Cloth  6 each Topical Q0600  . dronabinol  2.5 mg Oral BID AC  . erythromycin  250 mg Oral TID AC  . feeding supplement (ENSURE ENLIVE)  237 mL Oral TID BM  . gabapentin  100 mg Oral TID  . insulin aspart  0-15 Units Subcutaneous TID WC  . insulin aspart  0-5 Units Subcutaneous QHS  . insulin glargine  5 Units Subcutaneous QHS  . lidocaine  1 patch Transdermal Q24H  . metoCLOPramide  10 mg Oral TID AC  . midodrine  5 mg Oral 3 times per day on Mon Wed Fri  . multivitamin  1 tablet Oral QHS  . sodium chloride flush  10-40 mL Intracatheter Q12H  . Warfarin - Pharmacist Dosing Inpatient   Does not apply q1800   . heparin 1,450 Units/hr (04/04/19 0504)  . vancomycin 1,000 mg (04/03/19 1236)   acetaminophen, docusate sodium, hydrALAZINE, LORazepam, ondansetron (ZOFRAN) IV, oxyCODONE, promethazine  **OR** promethazine **OR** promethazine, sodium chloride flush, traMADol    Exam: Gen alert adult male in no distress  No jvd or bruits Chest clear bilat to bases Cor mechanical heart sounds Abd soft ntnd no mass or ascites  MS R foot skin darkening in several areas of the foot Neuro alert and oriented x 3; conversant Access: left chest tunneled dialysis catheter intact     Home meds:  - aspirin 81/ atorvastatin 40 qd  - metoclopramide 5 tid/ erythromycin 250 tid/ dronabinol 2.5 bid  - warfarin daily  - tramadol qid prn/ citalopram 20 qd/ oxy IR  20m prn/ gabapentin 100 bid  - Novolog 7u hs/ insulin glargine 10u qd  - midodrine 5 mg pre HD tiw  - prn's/ vitamins/ supplements    Outpt HD: [facility] MWF  4h 159m   84kg  400/800   2K/3Ca bath  TDC   Hep none  - mircera 200uig last 7/22  - venofer 50/w  - hect 2 ug tiw   Assessment/ Plan:  1. Abd pain /nausea/ vomiting - symptoms resolved per pt; h/o gastroparesis; per primary team  2. R foot ischemia - sp  perc revasc RLE and R 4th toe amp (7/13, 7/16). Has some discoloration of the R foot. VVS following.  Per vascular note he is high risk for transtibial amputation in the future.  cleared from vascular standpoint for discharge.     3. Osteomyelitis right foot - vanc 1 gram with HD through 04/24/19  4. ESRD: on HD MWF schedule.  Needs vanc 1 gram with HD through 04/24/19 as above for osteo.  Would not change dry weight - keep at 84 kg for now.   5. Anemia ABL/ CKD:  sp prbc with last transfusion on 7/29; on outpatient mircera 200 ug (dosing is every 2 weeks) and last received 200 ug on 7/22.  6. Hypotension/ volume: midodrine ^'d 5 tid per cards. No vol^ on exam 7. CHF/ severe ICM: s/p LVAD 2019.  On anticoagulation and heparin gtt 8. Chronic gastroparesis - on reglan, marinol, and erythromycin; per primary team  9. DM2 - per primary team    Iron/TIBC/Ferritin/ %Sat    Component Value Date/Time   IRON 40 (L) 11/02/2017 0016    TIBC 188 (L) 11/02/2017 0016   FERRITIN 523 (H) 11/02/2017 0016   IRONPCTSAT 21 11/02/2017 0016   Recent Labs  Lab 04/04/19 0500  NA 132*  K 4.3  CL 96*  CO2 25  GLUCOSE 97  BUN 23*  CREATININE 6.22*  CALCIUM 8.3*  PHOS 4.3  ALBUMIN 2.3*  INR 2.0*   No results for input(s): AST, ALT, ALKPHOS, BILITOT, PROT in the last 168 hours. Recent Labs  Lab 04/04/19 0500  WBC 16.5*  HGB 8.6*  HCT 26.8*  PLT 248    Claudia Desanctis 04/04/2019 6:29 AM

## 2019-04-04 NOTE — Progress Notes (Signed)
Patient ID: [name], male   DOB: 27-Mar-1966, 53 y.o.   MRN: [id number]   Advanced Heart Failure VAD Team Note  PCP-Cardiologist: No primary care provider on file.   Subjective:    Admitted with intractable N/V and R foot pain.   Nausea better, eating and has had a bowel movement.  Foot pain better.   WBCs 20 => 17 => 14 => 18 => 16.9, afebrile.  ESR 70.  Right foot film with possible osteomyelitis 4th metatarsal head.  Seen by ID, thought to be more likely reactive changes related to recent foot surgery but plan to continue vancomycin for 4 wks.   Acute severe sharp pain right flank 7/27 afternoon after HD.  CT abdomen/pelvis showed no acute abnormalities, no evidence for nephrolithiasis or discitis.  No RP hemorrhage.  In setting of severe pain, ?mental status changes and had CT head which was unremarkable.  He had right flank pain yesterday again after HD, improved with 250 cc bolus.  INR 2.0 on heparin gtt.    LVAD INTERROGATION:  HeartMate 3 LVAD:   Flow 3.8 liters/min, speed 5500 , power 4.4, PI 4.9.  No PI events.    Objective:    Vital Signs:   Temp:  [97.9 F (36.6 C)-99.4 F (37.4 C)] 97.9 F (36.6 C) (07/30 0504) Pulse Rate:  [75-104] 76 (07/30 0504) Resp:  [0-19] 14 (07/30 0504) BP: (82-124)/(39-93) 110/90 (07/30 0504) SpO2:  [96 %-100 %] 97 % (07/29 2346) Weight:  [80.4 kg-85 kg] 85 kg (07/30 0504) Last BM Date: 03/31/19 Mean arterial Pressure 90  Intake/Output:   Intake/Output Summary (Last 24 hours) at 04/04/2019 0717 Last data filed at 04/04/2019 0504 Gross per 24 hour  Intake 1525.98 ml  Output 1988 ml  Net -462.02 ml     Physical Exam   General: Well appearing this am. NAD.  HEENT: Normal. Neck: Supple, JVP 7-8 cm. Carotids OK.  Cardiac:  Mechanical heart sounds with LVAD hum present.  Lungs:  CTAB, normal effort.  Abdomen:  NT, ND, no HSM. No bruits or masses. +BS  LVAD exit site: Well-healed and incorporated. Dressing dry and intact. No  erythema or drainage. Stabilization device present and accurately applied. Driveline dressing changed daily per sterile technique. Extremities:  No edema.  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  Tips of other toes on right foot are dark.  Neuro:  Alert & oriented x 3. Cranial nerves grossly intact. Moves all 4 extremities w/o difficulty. Affect pleasant     Telemetry   NSR 80s. Personally reviewed   EKG    N/a   Labs   Basic Metabolic Panel: Recent Labs  Lab 03/30/19 0316 03/31/19 0325 04/01/19 0246 04/03/19 0445 04/04/19 0500  NA 133* 137 135 131* 132*  K 3.7 3.7 4.0 4.7 4.3  CL 96* 97* 95* 97* 96*  CO2 26 26 27 23 25   GLUCOSE 226* 176* 186* 95 97  BUN 26* 38* 47* 37* 23*  CREATININE 5.73* 8.16* 10.03* 8.71* 6.22*  CALCIUM 8.3* 8.6* 8.4* 8.2* 8.3*  PHOS  --   --   --  4.3 4.3    Liver Function Tests: Recent Labs  Lab 04/03/19 0445 04/04/19 0500  ALBUMIN 2.3* 2.3*   No results for input(s): LIPASE, AMYLASE in the last 168 hours. No results for input(s): AMMONIA in the last 168 hours.  CBC: Recent Labs  Lab 04/01/19 1515 04/01/19 1824 04/02/19 0345 04/03/19 0445 04/04/19 0500  WBC 23.3* 23.4* 18.0* 16.9* 16.5*  NEUTROABS  --  19.9*  --   --   --   HGB 8.5* 8.0* 7.7* 7.2* 8.6*  HCT 25.9* 25.3* 24.9* 22.8* 26.8*  MCV 93.2 95.8 98.4 96.2 97.1  PLT 238 248 242 259 248    INR: Recent Labs  Lab 03/31/19 0325 04/01/19 0246 04/02/19 0409 04/03/19 0445 04/04/19 0500  INR 1.9* 1.6* 1.4* 1.6* 2.0*    Other results:    Imaging   No results found.   Medications:     Scheduled Medications: . sodium chloride   Intravenous Once  . aspirin EC  81 mg Oral Daily  . calcitRIOL  0.5 mcg Oral Q M,W,F  . Chlorhexidine Gluconate Cloth  6 each Topical Daily  . Chlorhexidine Gluconate Cloth  6 each Topical Q0600  . Chlorhexidine Gluconate Cloth  6 each Topical Q0600  . Chlorhexidine Gluconate Cloth  6 each Topical Q0600  . dronabinol  2.5 mg Oral BID AC  .  erythromycin  250 mg Oral TID AC  . feeding supplement (ENSURE ENLIVE)  237 mL Oral TID BM  . gabapentin  100 mg Oral TID  . insulin aspart  0-15 Units Subcutaneous TID WC  . insulin aspart  0-5 Units Subcutaneous QHS  . insulin glargine  5 Units Subcutaneous QHS  . lidocaine  1 patch Transdermal Q24H  . metoCLOPramide  10 mg Oral TID AC  . midodrine  5 mg Oral 3 times per day on Mon Wed Fri  . multivitamin  1 tablet Oral QHS  . sodium chloride flush  10-40 mL Intracatheter Q12H  . warfarin  5 mg Oral Once per day on Sun Mon Wed Thu Sat  . [START ON 04/05/2019] warfarin  7.5 mg Oral Once per day on Tue Fri  . Warfarin - Pharmacist Dosing Inpatient   Does not apply q1800    Infusions: . vancomycin 1,000 mg (04/03/19 1236)    PRN Medications: acetaminophen, docusate sodium, hydrALAZINE, LORazepam, ondansetron (ZOFRAN) IV, oxyCODONE, promethazine **OR** promethazine **OR** promethazine, sodium chloride flush, traMADol    Assessment/Plan:    1. PAD:  Peripheral angiogram 7/13 with 80% right SFA stenosis and occluded right PT.  He had stenting of SFA and PTCA of PT with restoration of flow.  However, he still needed amputation right 4th toe on 7/16 with intractable pain.  He was readmitted with pain in his right foot, nausea, and vomiting.  Pain somewhat better now but remaining toes on right foot are dark at the tips concerning for ischemia.  He also has significant unrevascularized disease in left leg.   ESR 70, concern for osteomyelitis at right 4th metatarsal head => seen by ID and thought to be most likely reactive changes post-surgery but plan to treat with vancomycin for 4 wks to 8/19.  WBCs elevated but afebrile.  Discussed with Dr. Donzetta Matters, suspect he will eventually need wider amputation in the future but does not seem emergent and can likely hold off for now.  - Will await demarcation of foot, to be seen by Dr. Donzetta Matters in office post-discharge.  - Continue vancomycin with HD x 4 wks to  04/24/19.  2. Chronic systolic CHF: Ischemic cardiomyopathy. St Jude ICD. NSTEMI in March 2018 with DES to LAD and RCA, complicated by cardiogenic shock, low output requiring milrinone. Echo 8/18 with EF 20-25%, moderate MR. CPX (8/18) with severe functional impairment due to HF. He is s/p HM3 LVAD placement. He had pump thrombosis 6/19 due to [facility] outflow graft kinking. He  had pump exchange for another HM3. INR 2.0 today.  - Volume managed at dialysis.  - Continue ASA 81 and warfarin with INR goal 2-2.5.  Can stop heparin gtt today and go home.   - He has been referred to [facility] for evaluation for heart/kidney transplantand recently saw Dr Mosetta Pigeon => he may not be a candidate for transplant given extensive PAD.  3. ESRD: Developed peri-operatively after HM3 placement. 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  4. CAD: NSTEMI in 3/18. LHC with 99% ulcerated lesion proximal RCA with left to right collaterals, 95% mid LAD stenosis after mid LAD stent. s/p PCI to RCA and LAD on 11/25/16.  -Continue statin and ASA. 4. h/o DVTs: Factor V Leiden heterozygote. -Anticoagulated. 5. Diabetic gastroparesis: Difficult to manage. He has seen gastroparesis specialist at [facility]. Surprisingly, gastric emptying study was normal. However, electrogastrogram showed poor gastric accomodation/capacity. Therefore, small frequent meals recommended.Had nausea/vomiting this admission, narcotics likely making this worse with constipation. He is feeling better and eating, has had BM.  - Continue reglan 10 mg tid/ac and Marinol.  - Continue Protonix bid.  - He will continue erythromycinindefinitely.  - Limit narcotic use as much as possible.  6. HU:OHFGB with SSI and home glargine. 7. HTN:MAP has been lower with dialysis, now off Bidil and getting midodrine prior to HD. 8. Atrial flutter: Paroxysmal. He has been in NSR recently.  - Continue  amiodarone. 9. Left hand tingling and grip weakness: Present since he developed left arm hematoma from failed AV graft. 10. ID: WBCs up but no fever.  As above, right floot plain film concerning for osteomyelitis right 4th metatarsal head.  ESR 70. However, seen by ID and think most likely reactive post-surgery and less likely infectious.  ID has recommended vancomycin to continue for 4 wks through 04/24/19.  - Continue vancomycin with HD. 11. Right flank pain: Severe, sharp 7/27 afternoon.  CT abdomen/pelvis showed no acute finding to explain pain.  No evidence for nephrolithiasis, RP hemorrhage, discitis, fecal impaction.  Pain now resolved.  ?Related to fluid shifts with HD as it started after HD. He had a similar episode on 7/29 after HD, better with small fluid bolus.  Suspect symptoms related to fluid shifts with HD.   He can go home today.   I reviewed the LVAD parameters from today, and compared the results to the patient's prior recorded data.  No programming changes were made.  The LVAD is functioning within specified parameters.  The patient performs LVAD self-test daily.  LVAD interrogation was + 3 low flows. LVAD equipment check completed and is in good working order.  Back-up equipment present.   LVAD education done on emergency procedures and precautions and reviewed exit site care.  Length of Stay: [place], MD 04/04/2019, 7:17 AM  VAD Team --- VAD ISSUES ONLY--- Pager [phone] (7am - 7am)  Advanced Heart Failure Team  Pager [phone] (M-F; 7a - 4p)  Please contact [facility] for night-coverage after hours (4p -7a ) and weekends on [email or web address]

## 2019-04-04 NOTE — Discharge Instructions (Signed)

## 2019-04-04 NOTE — Progress Notes (Signed)
Discussed and explained discharge instructions, e-prescription sent to pharmacy. Going home with daugther via w/c

## 2019-04-04 NOTE — Discharge Summary (Signed)
Advanced Heart Failure Team  Discharge Summary   Patient ID: [name] MRN: [id number], DOB/AGE: November 15, 1965 53 y.o. Admit date: 03/27/2019 D/C date:     04/04/2019   Primary Discharge Diagnoses:  1. PAD  2. Chronic systolic CHF 3. ESRD 4. CAD: NSTEMI in 3/18. LHC with 99% ulcerated lesion proximal RCA with left to right collaterals, 95% mid LAD stenosis after mid LAD stent. s/p PCI to RCA and LAD on 11/25/16.  4. h/o DVTs: Factor V Leiden heterozygote. 5. Diabetic gastroparesis 6. DM 7. HTN 8. Atrial flutter: Paroxysmal.  9. Left hand tingling and grip weakness 10. ID 11. Right flank pain  Hospital Course:  [name] is a 54 year old with CAD s/p anterior MI in 2010 and NSTEMI in 3/18 with DES to pRCA and mLAD, gastroparesis,  ischemic cardiomyopathy now s/p Heartmate 3 LVAD and ESRD, factor V leiden, PAF, and ischemic foot. He has had multiple admits for N/V.   Admitted 7/10 with ischemic right foot. VVS consulted and on 7/13 he underwent peripheral angiogram that showed 80% stenosis r SFV and occluded right PT and had stent to right SFA and angioplasty to PT. He then had 4th toe amputation. He was discharged to home on 03/22/19.   Admitted with recurrent n/v, leukocytosis and ischemic changes in right foot. Admit ICU for treatment of severe gastroparesis and possible ischemic foot.  VVS , Nephrology, ID  consulted.   See below for detailed problems list. He will continue to be followed closely in the VAD clinic.    1. PAD:  Peripheral angiogram 7/13 with 80% right SFA stenosis and occluded right PT. He had stenting of SFA and PTCA of PT with restoration of flow. However, he still needed amputation right 4th toe on 7/16 with intractable pain.  He was readmitted with pain in his right foot, nausea, and vomiting.  Pain somewhat better now but remaining toes on right foot are dark at the tips concerning for ischemia.  He also has significant unrevascularized disease in left  leg.   ESR 70, concern for osteomyelitis at right 4th metatarsal head => seen by ID and thought to be most likely reactive changes post-surgery but plan to treat with vancomycin for 4 wks to 8/19.  WBCs elevated but afebrile.  Discussed with [name], suspect he will eventually need wider amputation in the future but does not seem emergent and can likely hold off for now.  - Will await demarcation of foot, to be seen by [name] in office post-discharge.  - Continue vancomycin with HD x 4 wks to 04/24/19.  2. Chronic systolic CHF: Ischemic cardiomyopathy. St Jude ICD. NSTEMI in March 2018 with DES to LAD and RCA, complicated by cardiogenic shock, low output requiring milrinone. Echo 8/18 with EF 20-25%, moderate [name]. CPX (8/18) with severe functional impairment due to HF. He is s/p HM3 LVAD placement. He had pump thrombosis 6/19 due to [facility] outflow graft kinking. He had pump exchange for another HM3.  - On Heparin drip until INR was within a good range. INR 2.0 of the day discharge.   - Volume managed at dialysis.  - Continue ASA 81and warfarin with INR goal 2-2.5.  - He has been referred to [facility] for evaluation for heart/kidney transplantand recently saw [name] => he may not be a candidate for transplant given extensive PAD.  3. ESRD: Developed peri-operatively after HM3 placement. He is tolerating HD, currently via catheter as he has failed AV fistula and  AV graft placement.  4. CAD: NSTEMI in 3/18. LHC with 99% ulcerated lesion proximal RCA with left to right collaterals, 95% mid LAD stenosis after mid LAD stent. s/p PCI to RCA and LAD on 11/25/16.  -Continue statin and ASA. 4. h/o DVTs: Factor V Leiden heterozygote. -Anticoagulated. 5. Diabetic gastroparesis: Difficult to manage. He has seen gastroparesis specialist at [facility]. Surprisingly, gastric emptying study was normal. However, electrogastrogram showed poor gastric accomodation/capacity. Therefore, small frequent meals  recommended.Had nausea/vomiting this admission, narcotics likely making this worse with constipation. He is feeling better and eating, has had BM.  - Continue reglan 10 mg tid/ac and Marinol.  - Continue Protonix bid.  - He will continue erythromycinindefinitely.  - Limit narcotic use as much as possible.  6. KP:TWSFK with SSI and home glargine. 7. HTN:MAP has been lower with dialysis, now off Bidil and getting midodrine prior to HD. 8. Atrial flutter: Paroxysmal. He has been in NSR recently.  - Continue amiodarone. 9. Left hand tingling and grip weakness: Present since he developed left arm hematoma from failed AV graft. 10. ID: WBCs up but no fever. As above, right floot plain film concerning for osteomyelitis right 4th metatarsal head.  ESR 70. However, seen by ID and think most likely reactive post-surgery and less likely infectious.  ID has recommended vancomycin to continue for 4 wks through 04/24/19.  - Continue vancomycin with HD. 11. Right flank pain: Severe, sharp 7/27 afternoon.  CT abdomen/pelvis showed no acute finding to explain pain.  No evidence for nephrolithiasis, RP hemorrhage, discitis, fecal impaction.  Pain now resolved.  ?Related to fluid shifts with HD as it started after HD. He had a similar episode on 7/29 after HD, better with small fluid bolus.  Suspect symptoms related to fluid shifts with HD.    Discharge Vitals: Blood pressure (!) 131/99, pulse 76, temperature 98.5 F (36.9 C), temperature source Oral, resp. rate 19, height 5' 7.01" (1.702 m), weight 85 kg, SpO2 97 %.  Labs: Lab Results  Component Value Date   WBC 16.5 (H) 04/04/2019   HGB 8.6 (L) 04/04/2019   HCT 26.8 (L) 04/04/2019   MCV 97.1 04/04/2019   PLT 248 04/04/2019    Recent Labs  Lab 04/04/19 0500  NA 132*  K 4.3  CL 96*  CO2 25  BUN 23*  CREATININE 6.22*  CALCIUM 8.3*  GLUCOSE 97   Lab Results  Component Value Date   CHOL 128 05/04/2017   HDL 50 05/04/2017    LDLCALC 54 05/04/2017   TRIG 118 05/04/2017   BNP (last 3 results) No results for input(s): BNP in the last 8760 hours.  ProBNP (last 3 results) No results for input(s): PROBNP in the last 8760 hours.   Diagnostic Studies/Procedures   No results found.  Discharge Medications   Allergies as of 04/04/2019      Reactions   Metformin And Related Diarrhea      Medication List    TAKE these medications   acetaminophen 325 MG tablet Commonly known as: TYLENOL Take 2 tablets (650 mg total) by mouth every 4 (four) hours as needed for headache or mild pain.   aspirin EC 81 MG tablet Take 1 tablet (81 mg total) by mouth daily.   atorvastatin 40 MG tablet Commonly known as: LIPITOR Take 1 tablet (40 mg total) by mouth daily at 6 PM.   calcitRIOL 0.5 MCG capsule Commonly known as: ROCALTROL Take 1 capsule (0.5 mcg total) by mouth every Monday,  Wednesday, and Friday.   citalopram 20 MG tablet Commonly known as: CELEXA Take 1 tablet (20 mg total) by mouth daily.   docusate sodium 100 MG capsule Commonly known as: COLACE Take 1 capsule (100 mg total) by mouth daily as needed for mild constipation.   dronabinol 2.5 MG capsule Commonly known as: MARINOL Take 1 capsule (2.5 mg total) by mouth 2 (two) times daily before lunch and supper.   erythromycin 250 MG tablet Commonly known as: E-MYCIN Take 1 tablet (250 mg total) by mouth 3 (three) times daily.   gabapentin 100 MG capsule Commonly known as: NEURONTIN Take 1 capsule (100 mg total) by mouth 2 (two) times daily. What changed: when to take this   insulin glargine 100 UNIT/ML injection Commonly known as: LANTUS Inject 0.1 mLs (10 Units total) into the skin daily. What changed: how much to take   LORazepam 0.5 MG tablet Commonly known as: ATIVAN Take 1 tablet (0.5 mg total) by mouth every 12 (twelve) hours as needed (nausea).   metoCLOPramide 10 MG tablet Commonly known as: REGLAN Take 1 tablet (10 mg total) by  mouth 3 (three) times daily.   midodrine 5 MG tablet Commonly known as: PROAMATINE Take 1 tablet (5 mg total) by mouth daily. Take 1 tablet before dialysis treatment   multivitamin Tabs tablet Take 1 tablet by mouth daily.   NovoLOG 100 UNIT/ML injection Generic drug: insulin aspart INJECT 7 UNITS INTO THE SKIN DAILY WITH SUPPER. What changed: See the new instructions.   oxyCODONE 5 MG immediate release tablet Commonly known as: Roxicodone Take 1-2 tablets every 4-6 hours for severe pain.   promethazine 12.5 MG tablet Commonly known as: PHENERGAN Take 1 tablet (12.5 mg total) by mouth every 6 (six) hours as needed for nausea or vomiting.   traMADol 50 MG tablet Commonly known as: ULTRAM Take 1 tablet (50 mg total) by mouth every 6 (six) hours as needed for moderate pain.   vancomycin 1-5 GM/200ML-% Soln Commonly known as: VANCOCIN Inject 200 mLs (1,000 mg total) into the vein every Monday, Wednesday, and Friday with hemodialysis for 19 days. Start taking on: April 05, 2019   warfarin 5 MG tablet Commonly known as: COUMADIN Take as directed. If you are unsure how to take this medication, talk to your nurse or doctor. Original instructions: TAKE AS FOLLOWS: MON-WED-FRI 1&1/2 TABLETS DAILY. ON ALL OTHER DAYS 1 TABLET What changed:   how much to take  how to take this  when to take this  additional instructions       Disposition   The patient will be discharged in stable condition to home. Discharge Instructions    Diet - low sodium heart healthy   Complete by: As directed    Increase activity slowly   Complete by: As directed    Page VAD Coordinator at [phone]  Notify for: any VAD alarms, sustained elevations of power >10 watts, sustained drop in Pulse Index <3   Complete by: As directed    Notify for:  any VAD alarms sustained elevations of power >10 watts sustained drop in Pulse Index <3     Speed Settings:   Complete by: As directed    Fixed 5500  RPM Low 5200 RPM     Follow-up Information    Waynetta Sandy, MD. Call.   Specialties: Vascular Surgery, Cardiology Contact information: [place] [address] [place]  [facility] Follow up on 04/11/2019.   Specialty: Cardiology Why: at 1000 Contact information: [address] [id number] [place] [phone]            Duration of Discharge Encounter: Greater than 35 minutes   Signed, Darrick Grinder NP-C  04/04/2019, 8:36 AM

## 2019-04-04 NOTE — Progress Notes (Signed)
ANTICOAGULATION CONSULT NOTE: Follow-up  Pharmacy Consult for heparin Indication: LVAD  Allergies  Allergen Reactions  . Metformin And Related Diarrhea    Patient Measurements: Height: 5' 7.01" (170.2 cm) Weight: 187 lb 6.3 oz (85 kg)(wt may be different due to pt feeling unstedy) IBW/kg (Calculated) : 66.12 Heparin Dosing Weight: 80kg  Vital Signs: Temp: 97.9 F (36.6 C) (07/30 0504) Temp Source: Oral (07/30 0504) BP: 110/90 (07/30 0504) Pulse Rate: 76 (07/30 0504)  Labs: Recent Labs    04/02/19 0345 04/02/19 0409  04/02/19 2300 04/03/19 0445 04/03/19 1406 04/04/19 0500  HGB 7.7*  --   --   --  7.2*  --  8.6*  HCT 24.9*  --   --   --  22.8*  --  26.8*  PLT 242  --   --   --  259  --  248  LABPROT  --  16.9*  --   --  18.9*  --  22.0*  INR  --  1.4*  --   --  1.6*  --  2.0*  HEPARINUNFRC  --  0.11*   < > <0.10*  --  0.48 0.43  CREATININE  --   --   --   --  8.71*  --  6.22*   < > = values in this interval not displayed.    Estimated Creatinine Clearance: 14.2 mL/min (A) (by C-G formula based on SCr of 6.22 mg/dL (H)).  c Infusions:  . vancomycin 1,000 mg (04/03/19 1236)    Assessment: 53yo male on Coumadin for LVAD, admitted for foot pain/PAD, w/ INR below goal, to start heparin bridge.  Patient receiving heparin infusion 1450 uts/hr  in central line. HL was 0.43 at goal.  No issues with infusion noted INR 2.0 at goal today after several boosts - can DC heparin  Can restart home dose  Goal of Therapy:  INR 2-3 Heparin level 0.3-0.7 units/ml Monitor platelets by anticoagulation protocol: Yes   Plan:  Stop heparin  Restart home dose warfarin 7.72m Tue and Fri and 54mall other days Monitor daily Protime, HL, CBC, and for s/sx of bleeding   LiBonnita Nasutiharm.D. CPP, BCPS Clinical Pharmacist [phone]/30/2020 7:09 AM

## 2019-04-04 NOTE — Progress Notes (Signed)
LVAD Coordinator Rounding Note:  Pt admitted per Dr. Haroldine Laws 03/27/19 for nausea, vomiting, and right foot pain.  HM III LVAD implanted on 06/12/17 by Dr. Prescott Gum under Destination Therapy criteria due to renal insufficiency. Pump exchange on 03/01/18 for outflow graft twist/obstruction.   Patient getting from bed to w/c with assistance from [place] (Physiological scientist). Pt says his daughter is "waiting" and he is anxious to get home, discharge in process.   Pt very unsteady on his feet making transfer, encouraged him to use walker when he gets home. Called [name] to re-enforce need for walker at home until he gets stronger and develops better balance. She says she will "try", but pt thinks his 4 prong cane is enough. He has both the cane and walker at home and she will encourage him to use the walker.   Vital signs: Temp:  98.5 HR:  57 Doppler Pressure: 110 Auto BP: 131/99 (108) O2 Sat: 98% on RA Wt: 182.9>186.2>193.3>193.5>187.3 lbs  LVAD interrogation reveals:  Speed: 5500 Flow: 3.9 Power: 4.6w PI: 4.9 Alarms: none Events: none  Hematocrit: 26  Fixed speed: 5500 Low speed limit: 5200  Drive Line:  CDI., anchor intact and accurately applied. Weekly dressing kits.Daughter changes dressing at home.  Labs: LDH trend: 181>159>204>246>226>214  INR trend: 4.0>4.4>1.6>1.4>2.0  WBC trend: 18.6>18.9>14.1>18.0>16.5  Infection:  Blood cx 03/27/19>> negative final  Anticoagulation Plan: - INR Goal: 2.0 - 3.0 - ASA Dose: 81 mg daily   Device: -St Jude dual  -Therapies: on 200 bpm - Monitor: on VT 150 bpm  Adverse Events on VAD: - ESRD post LVAD; started CVVH 06/15/17; HD started 06/20/17 - Hospitalization 11/5 - 07/16/17 > gastroparesis diagnosis after EGD - Hospitalization 11/26 - 08/04/17 > gastroparesis - Hospitalization 1/8 - 09/16/17>gastroparesis - referred to Dr. Corliss Parish at [facility] - 10/31/17>rightupper arm arteriovenous graft with Artegraft; ligation of right first stage  brachial vein transposition  - 12/11/13 elective admission for thrombectomy for AV graft in RUA  - 12/20/17 > intractable N/V - 01/19/18 > intractable N/V sent by EMS from dialysis center - 03/01/18 > outflow graft twist/occulsion requiring pump exchange - 05/23/18 > admitted for non-working fistula in RUA - 06/10/18 > admitted for intractable N/V - 08/08/18 > admitted for peri-op management for 2nd stage of AV fistula, found to be sclerotic. New LUE graft created from brachial artery to axillary vein. Developed spontaneous LUE hematoma.  - 08/22/18 > admitted for intractable N/V - 08/28/18> admitted for intractable N/V/D and low INR  Plan/Recommendations:  1. Please page VAD coordinator with any equipment questions or concerns.   Zada Girt RN [place] Coordinator  Office: [phone]  24/7 Pager: [phone]

## 2019-04-05 ENCOUNTER — Ambulatory Visit: Payer: BC Managed Care – PPO | Admitting: Vascular Surgery

## 2019-04-10 ENCOUNTER — Telehealth (HOSPITAL_COMMUNITY): Payer: Self-pay | Admitting: Pharmacist

## 2019-04-10 NOTE — Telephone Encounter (Signed)
Called patient regarding missed INR - he or son will be calling company today to check controls on machine Was reading high compared to lab draw las week  [place].D. CPP, BCPS Clinical Pharmacist [phone] 04/10/2019 5:20 PM

## 2019-04-11 ENCOUNTER — Ambulatory Visit (INDEPENDENT_AMBULATORY_CARE_PROVIDER_SITE_OTHER): Payer: BC Managed Care – PPO | Admitting: Student in an Organized Health Care Education/Training Program

## 2019-04-11 ENCOUNTER — Other Ambulatory Visit: Payer: Self-pay

## 2019-04-11 ENCOUNTER — Ambulatory Visit (HOSPITAL_COMMUNITY): Payer: Self-pay | Admitting: Pharmacist

## 2019-04-11 VITALS — BP 110/80 | HR 56

## 2019-04-11 DIAGNOSIS — G8929 Other chronic pain: Secondary | ICD-10-CM

## 2019-04-11 DIAGNOSIS — M545 Low back pain: Secondary | ICD-10-CM

## 2019-04-11 DIAGNOSIS — Z7901 Long term (current) use of anticoagulants: Secondary | ICD-10-CM | POA: Diagnosis not present

## 2019-04-11 DIAGNOSIS — Z09 Encounter for follow-up examination after completed treatment for conditions other than malignant neoplasm: Secondary | ICD-10-CM

## 2019-04-11 DIAGNOSIS — Z5181 Encounter for therapeutic drug level monitoring: Secondary | ICD-10-CM

## 2019-04-11 DIAGNOSIS — Z95811 Presence of heart assist device: Secondary | ICD-10-CM

## 2019-04-11 LAB — POCT INR: INR: 3 (ref 2.0–3.0)

## 2019-04-11 MED ORDER — DICLOFENAC SODIUM 1 % TD GEL: 4.0000 g | Freq: Four times a day (QID) | TRANSDERMAL | 1 refills | Status: DC

## 2019-04-11 NOTE — Progress Notes (Signed)
   Subjective:    Patient ID: [name], male    DOB: 1965-12-04, 53 y.o.   MRN: [id number]   CC: hospital follow up, INR check  HPI:  Patient here for follow up from hospitalization in which he received stents in R lower extremity for claudication/stenosis and a digit amputation for refractory pain. Patient states he has been doing well since discharge without complaint. He continues to take IV antibiotics with dialysis appointments. Has some residual pain in his leg which is improved with pain medication. Patient had follow up with cardiology and VSS and it appears he missed his heart appointment this morning unknowning that he had that appointment.  Patient complained of chronic back pain. Abdominal CT was negative for abnormalities. He is already on several pain medications which help his pain some. He is agreeable to trying a topical pain medication.  INR- patient is on warfarin and had INR WNL on discharge and would like to be checked today as his home machine for checking is not working. Denies any obvious bleeding, chest/abdominal pain, LE swelling other than from surgery.  Smoking status reviewed   ROS: pertinent noted in the HPI   Past medical history, surgical, family, and social history reviewed and updated in the EMR as appropriate.  Objective:  BP 110/80   Pulse (!) 56   SpO2 98%   Vitals and nursing note reviewed  General: NAD, pleasant, able to participate in exam Extremities: right LE atrophy and darkened skin. Surgical incision present from digit amputation without erythema, edema. Or drainage. Considerable pain to palpation. Skin: warm and dry, no rashes noted Neuro: alert, no obvious focal deficits Psych: Normal affect and mood   Assessment & Plan:    Hospital discharge follow-up Patient dc 7/30 from heart service for peripheral stent placement and digit amputation of R LE.  Examination of foot shows poor circulation and healing surgical incision  without drainage, erythema or edema. Sensation intact. Recommended patient follow up with VSS and cardiologist  Back pain Prescribed Voltaren gel Consider physical therapy  Warfarin anticoagulation Warfarin 3.0 today. Top of therapeutic range.  Continue to monitor    Doristine Mango, [facility] PGY-2

## 2019-04-11 NOTE — Patient Instructions (Signed)
It was a pleasure to see you today!  To summarize our discussion for this visit:  We examined your foot which seems to be healing appropriately and has no signs of infection  Please follow up with your heart doctor and surgeon. I am providing their contact information here.  I am prescribing a topical pain medication for your back pain  Some additional health maintenance measures we should update are: Health Maintenance Due  Topic Date Due  . PNEUMOCOCCAL POLYSACCHARIDE VACCINE AGE 21-64 HIGH RISK  09/29/1966  . OPHTHALMOLOGY EXAM  09/29/1974  . FOOT EXAM  03/06/2018  . URINE MICROALBUMIN  03/06/2018  . INFLUENZA VACCINE  04/06/2019  .     Waynetta Sandy, MD. Call.   Specialties: Vascular Surgery, Cardiology Contact information: [place] [facility] [address] [place] Follow up on 04/11/2019.   Specialty: Cardiology Why: at 1000 Contact information: [address] [id number] [place] [phone]     Call the clinic at [phone] if your symptoms worsen or you have any concerns.   Thank you for allowing me to take part in your care,  Dr. Doristine Mango

## 2019-04-15 DIAGNOSIS — Z7901 Long term (current) use of anticoagulants: Secondary | ICD-10-CM | POA: Insufficient documentation

## 2019-04-15 DIAGNOSIS — M549 Dorsalgia, unspecified: Secondary | ICD-10-CM | POA: Insufficient documentation

## 2019-04-15 DIAGNOSIS — Z09 Encounter for follow-up examination after completed treatment for conditions other than malignant neoplasm: Secondary | ICD-10-CM | POA: Insufficient documentation

## 2019-04-15 NOTE — Assessment & Plan Note (Signed)
Prescribed Voltaren gel Consider physical therapy

## 2019-04-15 NOTE — Assessment & Plan Note (Signed)
Patient dc 7/30 from heart service for peripheral stent placement and digit amputation of R LE.  Examination of foot shows poor circulation and healing surgical incision without drainage, erythema or edema. Sensation intact. Recommended patient follow up with VSS and cardiologist

## 2019-04-15 NOTE — Assessment & Plan Note (Signed)
Warfarin 3.0 today. Top of therapeutic range.  Continue to monitor

## 2019-04-16 ENCOUNTER — Other Ambulatory Visit: Payer: Self-pay

## 2019-04-16 ENCOUNTER — Encounter (HOSPITAL_COMMUNITY): Payer: Self-pay

## 2019-04-16 ENCOUNTER — Ambulatory Visit (HOSPITAL_COMMUNITY)
Admission: RE | Admit: 2019-04-16 | Discharge: 2019-04-16 | Disposition: A | Discharge status: Home / Self Care | Payer: BC Managed Care – PPO | Source: Ambulatory Visit | Attending: Cardiology | Admitting: Cardiology

## 2019-04-16 VITALS — BP 122/78 | HR 93

## 2019-04-16 DIAGNOSIS — N186 End stage renal disease: Secondary | ICD-10-CM | POA: Diagnosis not present

## 2019-04-16 DIAGNOSIS — I214 Non-ST elevation (NSTEMI) myocardial infarction: Secondary | ICD-10-CM | POA: Diagnosis not present

## 2019-04-16 DIAGNOSIS — I2510 Atherosclerotic heart disease of native coronary artery without angina pectoris: Secondary | ICD-10-CM | POA: Diagnosis not present

## 2019-04-16 DIAGNOSIS — R531 Weakness: Secondary | ICD-10-CM | POA: Diagnosis not present

## 2019-04-16 DIAGNOSIS — Z79899 Other long term (current) drug therapy: Secondary | ICD-10-CM | POA: Insufficient documentation

## 2019-04-16 DIAGNOSIS — M79671 Pain in right foot: Secondary | ICD-10-CM

## 2019-04-16 DIAGNOSIS — I130 Hypertensive heart and chronic kidney disease with heart failure and stage 1 through stage 4 chronic kidney disease, or unspecified chronic kidney disease: Secondary | ICD-10-CM | POA: Insufficient documentation

## 2019-04-16 DIAGNOSIS — Z95811 Presence of heart assist device: Secondary | ICD-10-CM | POA: Diagnosis not present

## 2019-04-16 DIAGNOSIS — Z7982 Long term (current) use of aspirin: Secondary | ICD-10-CM | POA: Diagnosis not present

## 2019-04-16 DIAGNOSIS — I4892 Unspecified atrial flutter: Secondary | ICD-10-CM | POA: Diagnosis not present

## 2019-04-16 DIAGNOSIS — I999 Unspecified disorder of circulatory system: Secondary | ICD-10-CM

## 2019-04-16 DIAGNOSIS — K3184 Gastroparesis: Secondary | ICD-10-CM | POA: Insufficient documentation

## 2019-04-16 DIAGNOSIS — E785 Hyperlipidemia, unspecified: Secondary | ICD-10-CM | POA: Insufficient documentation

## 2019-04-16 DIAGNOSIS — Z9861 Coronary angioplasty status: Secondary | ICD-10-CM | POA: Insufficient documentation

## 2019-04-16 DIAGNOSIS — Z7901 Long term (current) use of anticoagulants: Secondary | ICD-10-CM | POA: Diagnosis not present

## 2019-04-16 DIAGNOSIS — R262 Difficulty in walking, not elsewhere classified: Secondary | ICD-10-CM | POA: Diagnosis not present

## 2019-04-16 DIAGNOSIS — I998 Other disorder of circulatory system: Secondary | ICD-10-CM

## 2019-04-16 DIAGNOSIS — E1122 Type 2 diabetes mellitus with diabetic chronic kidney disease: Secondary | ICD-10-CM | POA: Diagnosis not present

## 2019-04-16 DIAGNOSIS — Z9049 Acquired absence of other specified parts of digestive tract: Secondary | ICD-10-CM | POA: Diagnosis not present

## 2019-04-16 DIAGNOSIS — I255 Ischemic cardiomyopathy: Secondary | ICD-10-CM | POA: Insufficient documentation

## 2019-04-16 DIAGNOSIS — Z7984 Long term (current) use of oral hypoglycemic drugs: Secondary | ICD-10-CM | POA: Insufficient documentation

## 2019-04-16 DIAGNOSIS — E1151 Type 2 diabetes mellitus with diabetic peripheral angiopathy without gangrene: Secondary | ICD-10-CM | POA: Insufficient documentation

## 2019-04-16 DIAGNOSIS — Z992 Dependence on renal dialysis: Secondary | ICD-10-CM

## 2019-04-16 DIAGNOSIS — R109 Unspecified abdominal pain: Secondary | ICD-10-CM | POA: Insufficient documentation

## 2019-04-16 DIAGNOSIS — E1143 Type 2 diabetes mellitus with diabetic autonomic (poly)neuropathy: Secondary | ICD-10-CM | POA: Diagnosis not present

## 2019-04-16 DIAGNOSIS — I5022 Chronic systolic (congestive) heart failure: Secondary | ICD-10-CM | POA: Insufficient documentation

## 2019-04-16 MED ORDER — GABAPENTIN 300 MG PO CAPS: 300.0000 mg | ORAL_CAPSULE | Freq: Two times a day (BID) | ORAL | 2 refills | Status: DC

## 2019-04-16 NOTE — Addendum Note (Signed)
Encounter addended by: Mertha Baars, RN on: 04/16/2019 4:19 PM  Actions taken: Pend clinical note

## 2019-04-16 NOTE — Addendum Note (Signed)
Encounter addended by: Mertha Baars, RN on: 04/16/2019 2:58 PM  Actions taken: Clinical Note Signed

## 2019-04-16 NOTE — Progress Notes (Addendum)
Patient presents for hospital discharge follow up in [facility] today with his friend Gerald Stabs. Reports no problems with VAD equipment or concerns with drive line.   Pt arrives in a wheel chair today. He is having difficulty walking due to the pain in his right foot. He states he is unable to put any weight on the right foot from "the ball of the foot forward." Foot is warm to the touch with redness appreciated over top of foot. Bottom of 3rd toe is black and hard. New open sore on top of 2nd toe. He dropped one of his VAD batteries on the outer lateral aspect of foot; area is open and red without a scab. Patient has appointment with Dr. Donzetta Matters on Friday. Reports that he has been taking Gabapentin 368m BID for the pain. Ok per Dr. MAundra Dubin       Pt reports that during dialysis and for an hour or two afterwards he is having pain in his right flank that radiates to his drive line. He does not have pain on non dialysis days. No pain to palpation today. Dr. MAundra Dubinaware: possibly due to fluid shifts during dialysis.   Reports that he has not taken his medications this morning. States that his nausea has been minimal and he has been eating, drinking, and taking medications well.   Unable to draw labs today due to difficult venous access. [facility]   Vital Signs:  Doppler Pressure  130 Automatc BP:  122/78 (97) HR:  93 SPO2: 100%  Weight: 192 lb w/o eqt Last weight: 180.2 lb   VAD Indication: Destination therapy- Pt is listed for heart/kidney at [facility] status 4  VAD interrogation & Equipment Management: Speed: 5500 Flow: 3.9 Power: 4.5w    PI: 5.1  Alarms: none Events: 5-10 daily  Fixed speed 5500 Low speed limit: 5200  Primary Controller:  Replace back up battery in 17 months. Back up controller:   Replace back up battery in 22 months.  Annual Equipment Maintenance on UBC/PM was performed on 06/2017.   I reviewed the LVAD parameters from  today and compared the results to the patient's prior recorded data. LVAD interrogation was NEGATIVE for significant power changes, NEGATIVE for clinical alarms and STABLE for PI events/speed drops. No programming changes were made and pump is functioning within specified parameters. Pt is performing daily controller and system monitor self tests along with completing weekly and monthly maintenance for LVAD equipment.  LVAD equipment check completed and is in good working order. Back-up equipment present.    Exit Site Care: Drive line is being maintained weekly by daughter. CDI today. Anchor secure. Pt provided with 10 weekly kits today. Exit site healed and incorporated, the velour is fully implanted at exit site. No redness, tenderness, drainage, foul odor or rash noted.  Drive line anchor secure. Pt denies fever or chills.    Significant Events on VAD Support: -10/2016>> poss drive line infection, CT ABD neg, ID consult-doxy -11/09/16>> admit for poss drive line infection, IV abx -03/24/17>> doxy for poss drive line infection -05/04/17>> drive line debridement with wound-vac -06/2017>> drive line +proteus, IV abx, Bactrim x14 days  Device:St jude Dual Therapies: on 200 bpm Last check: 01/01/18 - 2 hrs Afl/Afib; EMI 04/02/18  BP & Labs: -  MAP 130 - Doppler is reflecting Modified systolic  Hgb not done today; difficult venous access - No S/S of bleeding. Specifically denies melena/BRBPR or nosebleeds.  LDH not done  today, difficult venous access - established baseline of 245- 350. Denies tea-colored urine. No power elevations noted on interrogation.   1 yr Intermacs follow up completed including:  Quality of Life, KCCQ-12, and Neurocognitive trail making.   Unable to complete 6 minute walk due to foot pain.    Patient Instructions:  1. No medication changes today.  2. Lattie Haw PharmD will be in touch regarding Coumadin dosing once dialysis center draws labs.  3. Return to clinic in 1  month. Please bring all equipment for annual maintenance.   [name] RN [place] Coordinator  Office: [phone]  24/7 Pager: [phone]     Cardiology: Dr. Aundra Dubin  HPI: 53 y.o. with history of CAD s/p anterior MI in 2010 and NSTEMI in 3/18 with DES to pRCA and mLAD and ischemic cardiomyopathy now s/p Heartmate 3 LVAD and ESRD presents for followup of CHF/LVAD.  Patient developed low output heart failure.  He was not deemed to be a good transplant candidate.  He was admitted in 10/18 and Heartmate 3 LVAD was placed.  He had baseline CKD stage 3 likely from diabetic nephropathy and CHF.  He developed peri-op AKI and ended up on dialysis.  No renal recovery to date.  St Jude ICD has been turned back on and ramp echo was done prior to discharge in 10/18, speed increased to 5600 rpm.   He was admitted in 11/18 with nausea/vomiting and abdominal pain.  This appears to have been due to diabetic gastroparesis (extensive GI evaluation). He had atrial flutter this admission that was converted to NSR with amiodarone.  He improved considerably with scheduled Reglan and erythromycin. He was re-admitted later in 11/18 with the same symptoms, suspect diabetic gastroparesis and was again treated with erythromycin.  He was not able to start domperidone due to prolonged QT interval. He had nausea again about a week ago but was able to manage at home with Phenergan suppositories.   Admitted 1/8 -> 09/16/17 for uncontrolled vomiting in setting of diabetic gastroparesis. Improved with phenergan suppositories and given erythromycin TID for 7 days post-discharge.    He was admitted in 2/19 for AV graft placement.  While in the hospital, we turned down his speed.  Since then, he has had considerably fewer PI events on HD days.    He was admitted in 4/19 with nausea/vomiting again, probably gastroparesis flare.   He was admitted in 5/19 with nausea/vomiting, likely gastroparesis flare, treated with erythromycin  course which he is finishing.   He has been evaluated at [facility] by Dr. Derrill Kay (gastroparesis specialist).   Gastric emptying study, surprisingly, was near normal. However, electrogastrogram showed poor gastric accomodation/capacity, "bradygastria."  Frequent small meals were recommended. Also, early am nausea was thought to be possible GERD.    Admitted 6/26 - 03/19/18 for emergent VAD exchange 03/01/18 in the setting of HM3 outflow graft kink. He required CVVHD post op, but was transtioned back to [facility] without problems. Course complicated by leukocytosis and fever. CT abd/chest negative for infection and blood cultures were negative. Post op pain well controlled withraretramadol. Able to tolerate diet. Evaluated by PT/OT and HH PT recommended for discharge. Ramp echo completed prior to discharge with speed optimization. Resumed81 mg ASA + coumadin with INR goal 2-3.  He was admitted 03/23/18 with nausea/gastroparesis symptoms and marked HTN at outpatient HD.  Erythromycin was restarted and gastroparesis symptoms were controlled with fall in BP and discharge on 03/25/18.   He was admitted in 9/19 for LUE AV  fistula placement.  He was readmitted in 10/19 with his typical gastroparesis symptoms, unable to take po. The fistula later failed and was replaced with a graft.  However, the graft was nonfunctional and developed a hematoma at the surgical site.    He was admitted in 12/19 with nausea/vomiting from HD. Also with LDH increased to 427 in setting of extensive hematoma around his left arm AV graft.  He has had tingling in his left hand and left grip is weak.    In 7/20, he was admitted with ischemic 4th toe on right foot.  Peripheral angiogram 03/18/19 showed 80% right SFA stenosis and occluded right PT. He had stenting of SFA and PTCA of PT with restoration of flow. However, he still needed amputation right 4th toe on 7/16 with intractable pain.  He was readmitted with pain in his right foot,  nausea, and vomiting. There was concern for ongoing right foot ischemia with all toes and a portion of the foot now dusky.  He was treated with IV vancomycin for possible osteomyelitis/infection, but thought that more likely ischemia could explain all the findings.  He was discharged home to followup with vascular surgery.   He still has pain in his right foot.  He is not able to bear full weight on the foot.  He also have been having right flank pain during HD.  CT abdomen/pelvis was unremarkable on 04/01/19.   No nausea/vomiting.  LVAD parameters stable. MAP 97 today.   Reports taking Coumadin as prescribed and adherence to anticoagulation based dietary restrictions. Denies BRBPR or melena. No dark urine or hematuria.   Labs (11/18): hgb 10.4 => 8.7, LDH 213 Labs (08/04/17): hgb 9 Labs (1/19): HgbA1c 6.1 Labs (2/19): hgb 13.2 Labs (3/19): hgb 11.1 Labs (7/19): hgb 9.6 Labs (10/19: hgb 11.8 Labs (12/19): LDH 427 => 237, hgb 7.7  PMH: 1. Chronic systolic CHF: Ischemic cardiomyopathy. St Jude ICD.  - Echo (11/17) with EF 25-30%, moderate diastolic dysfunction, mild MR, mildly dilated RV with moderately decreased systolic function, peak RV-RA gradient 48 mmHg.  - Echo (3/18) with EF 25-30%, wall motion abnormalities, normal RV size and systolic function.  - Admission 3/18 with cardiogenic shock and AKI, required milrinone gtt.  - Echo (8/18) with EF 20-25%, mild LVH, moderate LV dilation, mild MR.  - CPX (8/18) with peak VO2 12.4, VE/VCO2 slope 35, RER 1.05 => severe HR limitation.  - Heartmate 3 LVAD 10/18.  - Pump thrombosis in setting of HM3 outflow graft kink.  Pump exchange 6/19 for HM3.  2. CAD: Anterior MI 2005, had bifurcation stenting LAD/diagonal. Repeat cath 2010 with patent stents.  - NSTEMI 3/18: LHC with 99% ulcerated lesion proximal RCA, 95% mid LAD => PCI with DES to proximal RCA and mid LAD.  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  - Lower extremity venous dopplers (8/18): chronic  DVT - V/Q scan (8/18): Normal, no PE.  4. Crohns disease: History of colectomy.  5. GERD 6. Diabetic gastroparesis: Admission 11/18 with abdominal pain, nausea/vomiting.  - [facility] workup 2019: Gastric emptying study near normal.  Negative breath test (no evidence for small bowel bacterial overgrowth).  Electrogastrogram showed poor gastric accomodation/capacity, "bradygastria."  7. HTN 8. Hyperlipidemia 9. Type II diabetes  10. ESRD: Post-op LVAD surgery.  He has had failed AV fistula and AV graft, now dialyzing via catheter . 11. Carotid dopplers (8/18): mild bilateral stenosis.  12. PFTs (8/18): Mild restriction (no IDL on CT chest).  13. Lung nodules: CT chest  8/18 with small bilateral nodules.  14. PAD: ABIs with moderate bilateral disease in 8/18.  - Peripheral angiogram 03/18/19 showed 80% right SFA stenosis and occluded right PT. He had stenting of SFA and PTCA of PT with restoration of flow. However, he still needed amputation right 4th toe on 03/21/19 with intractable pain.  15. Atrial flutter: In 11/18 associated with nausea/vomiting from gastroparesis.   Current Outpatient Medications  Medication Sig Dispense Refill  . acetaminophen (TYLENOL) 325 MG tablet Take 2 tablets (650 mg total) by mouth every 4 (four) hours as needed for headache or mild pain.    [name] aspirin EC 81 MG tablet Take 1 tablet (81 mg total) by mouth daily. 90 tablet 3  . atorvastatin (LIPITOR) 40 MG tablet Take 1 tablet (40 mg total) by mouth daily at 6 PM. 30 tablet 6  . calcitRIOL (ROCALTROL) 0.5 MCG capsule Take 1 capsule (0.5 mcg total) by mouth every Monday, Wednesday, and Friday. 15 capsule 5  . citalopram (CELEXA) 20 MG tablet Take 1 tablet (20 mg total) by mouth daily. 30 tablet 6  . diclofenac sodium (VOLTAREN) 1 % GEL Apply 4 g topically 4 (four) times daily. 150 g 1  . docusate sodium (COLACE) 100 MG capsule Take 1 capsule (100 mg total) by mouth daily as needed for mild constipation. 10 capsule 0  .  dronabinol (MARINOL) 2.5 MG capsule Take 1 capsule (2.5 mg total) by mouth 2 (two) times daily before lunch and supper. 60 capsule 5  . erythromycin (E-MYCIN) 250 MG tablet Take 1 tablet (250 mg total) by mouth 3 (three) times daily. 90 tablet 4  . insulin glargine (LANTUS) 100 UNIT/ML injection Inject 0.1 mLs (10 Units total) into the skin daily. (Patient taking differently: Inject 5 Units into the skin daily. ) 10 mL 5  . LORazepam (ATIVAN) 0.5 MG tablet Take 1 tablet (0.5 mg total) by mouth every 12 (twelve) hours as needed (nausea). 60 tablet 2  . metoCLOPramide (REGLAN) 10 MG tablet Take 1 tablet (10 mg total) by mouth 3 (three) times daily. 90 tablet 6  . midodrine (PROAMATINE) 5 MG tablet Take 1 tablet (5 mg total) by mouth daily. Take 1 tablet before dialysis treatment 30 tablet 6  . multivitamin (RENA-VIT) TABS tablet Take 1 tablet by mouth daily.    [name] NOVOLOG 100 UNIT/ML injection INJECT 7 UNITS INTO THE SKIN DAILY WITH SUPPER. (Patient taking differently: Inject 7 Units into the skin daily with supper. ) 10 mL 3  . oxyCODONE (ROXICODONE) 5 MG immediate release tablet Take 1-2 tablets every 4-6 hours for severe pain. 15 tablet 0  . promethazine (PHENERGAN) 12.5 MG tablet Take 1 tablet (12.5 mg total) by mouth every 6 (six) hours as needed for nausea or vomiting. 30 tablet 3  . traMADol (ULTRAM) 50 MG tablet Take 1 tablet (50 mg total) by mouth every 6 (six) hours as needed for moderate pain. 30 tablet 0  . vancomycin (VANCOCIN) 1-5 GM/200ML-% SOLN Inject 200 mLs (1,000 mg total) into the vein every Monday, Wednesday, and Friday with hemodialysis for 19 days. 4000 mL 0  . warfarin (COUMADIN) 5 MG tablet TAKE AS FOLLOWS: MON-WED-FRI 1&1/2 TABLETS DAILY. ON ALL OTHER DAYS 1 TABLET (Patient taking differently: Take 5-7.5 mg by mouth one time only at 6 PM. 7.5 mg  Take Tuesday and Thursday 5 mg all the other days) 50 tablet 6  . gabapentin (NEURONTIN) 300 MG capsule Take 1 capsule (300 mg total)  by mouth  2 (two) times daily. 60 capsule 2   No current facility-administered medications for this encounter.     Metformin and related   Review of systems complete and found to be negative unless listed in HPI.    BP 122/78 Comment: map 97  Pulse 93   SpO2 100%  MAP 97  VAD interrogation & Equipment Management: See nurse's note above.   General: Well appearing this am. NAD.  HEENT: Normal. Neck: Supple, JVP 7-8 cm. Carotids OK.  Cardiac:  Mechanical heart sounds with LVAD hum present.  Lungs:  CTAB, normal effort.  Abdomen:  NT, ND, no HSM. No bruits or masses. +BS  LVAD exit site: Well-healed and incorporated. Dressing dry and intact. No erythema or drainage. Stabilization device present and accurately applied. Driveline dressing changed daily per sterile technique. Extremities:  S/p amputation right 4th toe. Right foot is dusky from the mid-foot to the toes.  Neuro:  Alert & oriented x 3. Cranial nerves grossly intact. Moves all 4 extremities w/o difficulty but left hand grip is weak. Affect pleasant     ASSESSMENT AND PLAN: 1. PAD:  Peripheral angiogram 03/18/19 with 80% right SFA stenosis and occluded right PT. He had stenting of SFA and PTCA of PT with restoration of flow. However, he still needed amputation right 4th toe on 03/21/19 with intractable pain.  He also has significant unrevascularized disease in left leg.   His right foot looks ischemic to me from the mid-foot to the toes.  I am concerned that he will need further amputation.  He will see Dr. Donzetta Matters at [facility] later this week.   2. Chronic systolic CHF: Ischemic cardiomyopathy. St Jude ICD. NSTEMI in March 2018 with DES to LAD and RCA, complicated by cardiogenic shock, low output requiring milrinone. Echo 8/18 with EF 20-25%, moderate MR. CPX (8/18) with severe functional impairment due to HF. He is s/p HM3 LVAD placement. He had pump thrombosis 6/19 due to [facility] outflow graft kinking. He had pump exchange for another  HM3. INR 3 today.  - Volume managed at dialysis.  - Continue ASA 81and warfarin with INR goal 2-2.5.  - He has been [facility] to [facility] for evaluation for heart/kidney transplantand recently saw Dr Mosetta Pigeon => he may not be a candidate for transplant now given extensive PAD. I have informed Dr. Mosetta Pigeon about his recent hospitalization.  3. ESRD: Developed peri-operatively after HM3 placement. 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  4. CAD: NSTEMI in 3/18. LHC with 99% ulcerated lesion proximal RCA with left to right collaterals, 95% mid LAD stenosis after mid LAD stent. s/p PCI to RCA and LAD on 11/25/16.  - Continue statin and ASA. 4. h/o DVTs: Factor V Leiden heterozygote. -Anticoagulated. 5. Diabetic gastroparesis: Difficult to manage. He has seen gastroparesis specialist at [facility]. Surprisingly, gastric emptying study was normal. However, electrogastrogram showed poor gastric accomodation/capacity. Therefore, small frequent meals recommended.Currently, no nausea/vomiting.   - Continue reglan 10 mg tid/ac and Marinol.  - Continue Protonix bid.  - He will continue erythromycinindefinitely.  - Limit narcotic use as much as possible.  6. DM:Per PCP.  7. HTN:MAP has been lower with dialysis, now off Bidil and getting midodrine prior to HD. 8. Atrial flutter: Paroxysmal. He has been in NSR recently.  - He is no longer on amiodarone.  9. Left hand tingling and grip weakness: Present since he developed left arm hematoma from failed AV graft. 10.  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  ESR 70. However, seen by ID and think most likely reactive post-surgery (right 4th toe amputation) and less likely infectious.  ID has recommended vancomycin to continue for 4 wks through 04/24/19.  - Continue vancomycin with HD. 11. Right flank pain: He has been having these episodes with  dialysis.  CT abdomen/pelvis showed no acute finding to explain pain.  No evidence for nephrolithiasis, RP hemorrhage, discitis, fecal impaction.  Pain now resolved.  Suspect symptoms related to fluid shifts with HD.   Loralie Champagne, MD  04/17/2019

## 2019-04-16 NOTE — Addendum Note (Signed)
Encounter addended by: Mertha Baars, RN on: 04/16/2019 3:20 PM  Actions taken: Clinical Note Signed

## 2019-04-16 NOTE — Patient Instructions (Addendum)
1. I will send orders to dialysis center for lab work.  2. Lattie Haw will be in touch regarding INR after lab draw. 3. Return to [place] [facility] in 1 month. Please bring all equipment for annual maintenance (Charity fundraiser, MPU, and batteries.)

## 2019-04-16 NOTE — Addendum Note (Signed)
Encounter addended by: Mertha Baars, RN on: 04/16/2019 4:22 PM  Actions taken: Clinical Note Signed

## 2019-04-17 NOTE — Addendum Note (Signed)
Encounter addended by: Larey Dresser, MD on: 04/17/2019 10:46 PM  Actions taken: Charge Capture section accepted, Clinical Note Signed, Level of Service modified, Visit diagnoses modified

## 2019-04-18 ENCOUNTER — Telehealth (HOSPITAL_COMMUNITY): Payer: Self-pay | Admitting: Rehabilitation

## 2019-04-18 NOTE — Telephone Encounter (Signed)

## 2019-04-19 ENCOUNTER — Other Ambulatory Visit: Payer: Self-pay

## 2019-04-19 ENCOUNTER — Encounter: Payer: Self-pay | Admitting: *Deleted

## 2019-04-19 ENCOUNTER — Encounter: Payer: Self-pay | Admitting: Physician Assistant

## 2019-04-19 ENCOUNTER — Ambulatory Visit (INDEPENDENT_AMBULATORY_CARE_PROVIDER_SITE_OTHER): Payer: Self-pay | Admitting: Physician Assistant

## 2019-04-19 ENCOUNTER — Other Ambulatory Visit: Payer: Self-pay | Admitting: *Deleted

## 2019-04-19 VITALS — BP 106/62 | HR 68 | Resp 20 | Ht 67.0 in | Wt 187.0 lb

## 2019-04-19 DIAGNOSIS — I739 Peripheral vascular disease, unspecified: Secondary | ICD-10-CM

## 2019-04-19 LAB — POCT INR: INR: 4 — AB (ref 2.0–3.0)

## 2019-04-19 NOTE — Progress Notes (Signed)
CC:  F/u for surgery  HPI:  53 year old male with history of end-stage renal disease also has a ventricular assist device.  He was noted to have gangrenous changes to his right fourth toe.  Duplex did not really demonstrate any abnormalities other than the SFA he was indicated for angiography possible intervention on the right.  He underwent Balloon angioplasty right posterior tibial artery with 3 and 2.5 mm balloons by Dr. Donzetta Matters on 03/18/2019.    Findings: His aorta and iliac segments are free of any flow-limiting stenosis.  The right side is the site of interest.  There is approximately 80% SFA stenosis distally which is resolved 0% after stenting.  His peroneal artery runs off to the mid calf.  His posterior tibial artery occludes after several centimeters proximally and reconstitutes distally in the calf.  After balloon angioplasty there is no flow-limiting stenosis.  Does not appear to be any flow-limiting dissection although there is may be minimal dissection distally in the artery.  Does have much improved flow through the posterior tibial artery at completion.  The left side SFA has a similar stenosis as the right.  Appears peroneal artery is the dominant runoff although could not see if it runs all the way to the foot.  Ray amputation right fourth toe.  He is here today for f/u and wound check. He states his right foot causes him increased pain with dependency position changes.  He has trouble resting, but when he first puts his foot down it hurts worse for a while.    He denise fever and chills.    Allergies  Allergen Reactions  . Metformin And Related Diarrhea    Current Outpatient Medications  Medication Sig Dispense Refill  . acetaminophen (TYLENOL) 325 MG tablet Take 2 tablets (650 mg total) by mouth every 4 (four) hours as needed for headache or mild pain.    [name] aspirin EC 81 MG tablet Take 1 tablet (81 mg total) by mouth daily. 90 tablet 3  . atorvastatin (LIPITOR) 40 MG  tablet Take 1 tablet (40 mg total) by mouth daily at 6 PM. 30 tablet 6  . calcitRIOL (ROCALTROL) 0.5 MCG capsule Take 1 capsule (0.5 mcg total) by mouth every Monday, Wednesday, and Friday. 15 capsule 5  . citalopram (CELEXA) 20 MG tablet Take 1 tablet (20 mg total) by mouth daily. 30 tablet 6  . diclofenac sodium (VOLTAREN) 1 % GEL Apply 4 g topically 4 (four) times daily. 150 g 1  . docusate sodium (COLACE) 100 MG capsule Take 1 capsule (100 mg total) by mouth daily as needed for mild constipation. 10 capsule 0  . dronabinol (MARINOL) 2.5 MG capsule Take 1 capsule (2.5 mg total) by mouth 2 (two) times daily before lunch and supper. 60 capsule 5  . erythromycin (E-MYCIN) 250 MG tablet Take 1 tablet (250 mg total) by mouth 3 (three) times daily. 90 tablet 4  . gabapentin (NEURONTIN) 300 MG capsule Take 1 capsule (300 mg total) by mouth 2 (two) times daily. 60 capsule 2  . insulin glargine (LANTUS) 100 UNIT/ML injection Inject 0.1 mLs (10 Units total) into the skin daily. (Patient taking differently: Inject 5 Units into the skin daily. ) 10 mL 5  . LORazepam (ATIVAN) 0.5 MG tablet Take 1 tablet (0.5 mg total) by mouth every 12 (twelve) hours as needed (nausea). 60 tablet 2  . metoCLOPramide (REGLAN) 10 MG tablet Take 1 tablet (10 mg total) by mouth 3 (three)  times daily. 90 tablet 6  . midodrine (PROAMATINE) 5 MG tablet Take 1 tablet (5 mg total) by mouth daily. Take 1 tablet before dialysis treatment 30 tablet 6  . multivitamin (RENA-VIT) TABS tablet Take 1 tablet by mouth daily.    [name] NOVOLOG 100 UNIT/ML injection INJECT 7 UNITS INTO THE SKIN DAILY WITH SUPPER. (Patient taking differently: Inject 7 Units into the skin daily with supper. ) 10 mL 3  . oxyCODONE (ROXICODONE) 5 MG immediate release tablet Take 1-2 tablets every 4-6 hours for severe pain. 15 tablet 0  . promethazine (PHENERGAN) 12.5 MG tablet Take 1 tablet (12.5 mg total) by mouth every 6 (six) hours as needed for nausea or vomiting. 30  tablet 3  . traMADol (ULTRAM) 50 MG tablet Take 1 tablet (50 mg total) by mouth every 6 (six) hours as needed for moderate pain. 30 tablet 0  . vancomycin (VANCOCIN) 1-5 GM/200ML-% SOLN Inject 200 mLs (1,000 mg total) into the vein every Monday, Wednesday, and Friday with hemodialysis for 19 days. 4000 mL 0  . warfarin (COUMADIN) 5 MG tablet TAKE AS FOLLOWS: MON-WED-FRI 1&1/2 TABLETS DAILY. ON ALL OTHER DAYS 1 TABLET (Patient taking differently: Take 5-7.5 mg by mouth one time only at 6 PM. 7.5 mg  Take Tuesday and Thursday 5 mg all the other days) 50 tablet 6   No current facility-administered medications for this visit.      ROS:  See HPI  Physical Exam:    Incision:  Ischemic changes progressing to surrounding 5th and 3erd toes with dry gangrene.  The forefoot involvement increased darkening skin changes.   Doppler right PT/peroneal no pedal doppler signals. The forefoot and toes are cool to touch. Left GT cool to touch without cap refill and dusky skin changes.  Doppler PT/peroneal no pedal doppler signals.  Assessment/Plan:  This is a 53 y.o. male who is s/p: S/p  Balloon angioplasty right posterior tibial artery with 3 and 2.5 mm balloons by Dr. Donzetta Matters on 03/18/2019.     I scheduled hi for right TMA verse BKA and left GT amputation.  He is an [facility] patient and will need to be admitted to [facility] to wean off his Coumadin with close monitoring.         Roxy Horseman PA-C [facility] [phone]  Clinic MD:  Oneida Alar

## 2019-04-22 ENCOUNTER — Telehealth: Payer: Self-pay | Admitting: *Deleted

## 2019-04-22 ENCOUNTER — Encounter (HOSPITAL_COMMUNITY): Payer: Self-pay | Admitting: *Deleted

## 2019-04-22 NOTE — Telephone Encounter (Signed)
[facility] to ask about lab results. Nurse reported results would not be available until Monday, 04/22/19. Asked her to fax results to [facility] office Monday am. She verbalized agreement to same.   Zada Girt RN, [facility] Coordinator [phone]

## 2019-04-22 NOTE — Telephone Encounter (Signed)
-----   Message from Larey Dresser, MD sent at 04/19/2019  3:27 PM EDT ----- Regarding: RE: [place] Will need to arrange pre-admission.  ----- Message ----- From: Willy Eddy, RN Sent: 04/19/2019   2:55 PM EDT To: Larey Dresser, MD, Amy Estrella Deeds, NP, # Subject: ANTICOAGULATION FOR PENDING SURGERY            [name] is scheduled for surgery with Dr. Donzetta Matters on 04/30/2019 He will need to hold Coumadin for 3 days prior to surgery. " Left great toe amputation and Right TMA versus BKA". In the past he has been admitted with you all for anticoag. maintenance pre-op. Appreciate your help with this. Thank you, Jacqlyn Larsen

## 2019-04-23 ENCOUNTER — Ambulatory Visit (HOSPITAL_COMMUNITY): Payer: Self-pay | Admitting: Pharmacist

## 2019-04-23 DIAGNOSIS — Z95811 Presence of heart assist device: Secondary | ICD-10-CM

## 2019-04-23 DIAGNOSIS — Z5181 Encounter for therapeutic drug level monitoring: Secondary | ICD-10-CM

## 2019-04-24 ENCOUNTER — Inpatient Hospital Stay: Payer: BC Managed Care – PPO | Admitting: Internal Medicine

## 2019-04-25 ENCOUNTER — Telehealth: Payer: Self-pay | Admitting: *Deleted

## 2019-04-25 NOTE — Telephone Encounter (Signed)
Call to patient. Confirm Coumadin hold x 3 days pre-op and will be contacted by Dr. Aundra Dubin for Sunday am admission. Verbalized understanding.

## 2019-04-26 ENCOUNTER — Other Ambulatory Visit (HOSPITAL_COMMUNITY)
Admission: RE | Admit: 2019-04-26 | Discharge: 2019-04-26 | Disposition: A | Discharge status: Home / Self Care | Payer: BC Managed Care – PPO | Source: Ambulatory Visit | Attending: Vascular Surgery | Admitting: Vascular Surgery

## 2019-04-26 ENCOUNTER — Inpatient Hospital Stay (HOSPITAL_COMMUNITY)
Admission: RE | Admit: 2019-04-26 | Discharge: 2019-05-13 | DRG: 239 | Disposition: A | Discharge status: Home Health | Payer: BC Managed Care – PPO | Attending: Cardiology | Admitting: Cardiology

## 2019-04-26 ENCOUNTER — Encounter (HOSPITAL_COMMUNITY): Payer: Self-pay | Admitting: *Deleted

## 2019-04-26 ENCOUNTER — Inpatient Hospital Stay (HOSPITAL_COMMUNITY): Payer: BC Managed Care – PPO

## 2019-04-26 ENCOUNTER — Other Ambulatory Visit (HOSPITAL_COMMUNITY): Payer: Self-pay | Admitting: *Deleted

## 2019-04-26 ENCOUNTER — Telehealth (HOSPITAL_COMMUNITY): Payer: Self-pay | Admitting: *Deleted

## 2019-04-26 ENCOUNTER — Other Ambulatory Visit: Payer: Self-pay

## 2019-04-26 DIAGNOSIS — D6851 Activated protein C resistance: Secondary | ICD-10-CM | POA: Diagnosis present

## 2019-04-26 DIAGNOSIS — Z794 Long term (current) use of insulin: Secondary | ICD-10-CM

## 2019-04-26 DIAGNOSIS — I96 Gangrene, not elsewhere classified: Secondary | ICD-10-CM | POA: Diagnosis not present

## 2019-04-26 DIAGNOSIS — Z7901 Long term (current) use of anticoagulants: Secondary | ICD-10-CM

## 2019-04-26 DIAGNOSIS — I5022 Chronic systolic (congestive) heart failure: Secondary | ICD-10-CM | POA: Diagnosis present

## 2019-04-26 DIAGNOSIS — Z95810 Presence of automatic (implantable) cardiac defibrillator: Secondary | ICD-10-CM

## 2019-04-26 DIAGNOSIS — I132 Hypertensive heart and chronic kidney disease with heart failure and with stage 5 chronic kidney disease, or end stage renal disease: Secondary | ICD-10-CM | POA: Diagnosis present

## 2019-04-26 DIAGNOSIS — I70268 Atherosclerosis of native arteries of extremities with gangrene, other extremity: Secondary | ICD-10-CM | POA: Diagnosis present

## 2019-04-26 DIAGNOSIS — Z95811 Presence of heart assist device: Secondary | ICD-10-CM

## 2019-04-26 DIAGNOSIS — Y838 Other surgical procedures as the cause of abnormal reaction of the patient, or of later complication, without mention of misadventure at the time of the procedure: Secondary | ICD-10-CM | POA: Diagnosis not present

## 2019-04-26 DIAGNOSIS — Z8249 Family history of ischemic heart disease and other diseases of the circulatory system: Secondary | ICD-10-CM

## 2019-04-26 DIAGNOSIS — Z992 Dependence on renal dialysis: Secondary | ICD-10-CM | POA: Diagnosis not present

## 2019-04-26 DIAGNOSIS — Z823 Family history of stroke: Secondary | ICD-10-CM

## 2019-04-26 DIAGNOSIS — Z20828 Contact with and (suspected) exposure to other viral communicable diseases: Secondary | ICD-10-CM | POA: Insufficient documentation

## 2019-04-26 DIAGNOSIS — E1143 Type 2 diabetes mellitus with diabetic autonomic (poly)neuropathy: Secondary | ICD-10-CM | POA: Diagnosis present

## 2019-04-26 DIAGNOSIS — I255 Ischemic cardiomyopathy: Secondary | ICD-10-CM | POA: Diagnosis present

## 2019-04-26 DIAGNOSIS — Z7982 Long term (current) use of aspirin: Secondary | ICD-10-CM

## 2019-04-26 DIAGNOSIS — K219 Gastro-esophageal reflux disease without esophagitis: Secondary | ICD-10-CM | POA: Diagnosis present

## 2019-04-26 DIAGNOSIS — Z01812 Encounter for preprocedural laboratory examination: Secondary | ICD-10-CM | POA: Insufficient documentation

## 2019-04-26 DIAGNOSIS — I998 Other disorder of circulatory system: Secondary | ICD-10-CM | POA: Diagnosis present

## 2019-04-26 DIAGNOSIS — D631 Anemia in chronic kidney disease: Secondary | ICD-10-CM | POA: Diagnosis present

## 2019-04-26 DIAGNOSIS — I2510 Atherosclerotic heart disease of native coronary artery without angina pectoris: Secondary | ICD-10-CM | POA: Diagnosis present

## 2019-04-26 DIAGNOSIS — I252 Old myocardial infarction: Secondary | ICD-10-CM | POA: Diagnosis not present

## 2019-04-26 DIAGNOSIS — N186 End stage renal disease: Secondary | ICD-10-CM | POA: Diagnosis present

## 2019-04-26 DIAGNOSIS — E1152 Type 2 diabetes mellitus with diabetic peripheral angiopathy with gangrene: Secondary | ICD-10-CM | POA: Diagnosis present

## 2019-04-26 DIAGNOSIS — T82838A Hemorrhage of vascular prosthetic devices, implants and grafts, initial encounter: Secondary | ICD-10-CM | POA: Diagnosis not present

## 2019-04-26 DIAGNOSIS — G47419 Narcolepsy without cataplexy: Secondary | ICD-10-CM | POA: Diagnosis present

## 2019-04-26 DIAGNOSIS — E1122 Type 2 diabetes mellitus with diabetic chronic kidney disease: Secondary | ICD-10-CM | POA: Diagnosis present

## 2019-04-26 DIAGNOSIS — N2581 Secondary hyperparathyroidism of renal origin: Secondary | ICD-10-CM | POA: Diagnosis present

## 2019-04-26 DIAGNOSIS — Z955 Presence of coronary angioplasty implant and graft: Secondary | ICD-10-CM | POA: Diagnosis not present

## 2019-04-26 DIAGNOSIS — Z833 Family history of diabetes mellitus: Secondary | ICD-10-CM

## 2019-04-26 DIAGNOSIS — Z01811 Encounter for preprocedural respiratory examination: Secondary | ICD-10-CM

## 2019-04-26 DIAGNOSIS — M898X9 Other specified disorders of bone, unspecified site: Secondary | ICD-10-CM | POA: Diagnosis present

## 2019-04-26 DIAGNOSIS — I480 Paroxysmal atrial fibrillation: Secondary | ICD-10-CM | POA: Diagnosis present

## 2019-04-26 DIAGNOSIS — K5090 Crohn's disease, unspecified, without complications: Secondary | ICD-10-CM | POA: Diagnosis present

## 2019-04-26 DIAGNOSIS — Z8371 Family history of colonic polyps: Secondary | ICD-10-CM

## 2019-04-26 DIAGNOSIS — Z888 Allergy status to other drugs, medicaments and biological substances status: Secondary | ICD-10-CM

## 2019-04-26 DIAGNOSIS — K3184 Gastroparesis: Secondary | ICD-10-CM | POA: Diagnosis not present

## 2019-04-26 DIAGNOSIS — E785 Hyperlipidemia, unspecified: Secondary | ICD-10-CM | POA: Diagnosis present

## 2019-04-26 DIAGNOSIS — E119 Type 2 diabetes mellitus without complications: Secondary | ICD-10-CM

## 2019-04-26 DIAGNOSIS — E7800 Pure hypercholesterolemia, unspecified: Secondary | ICD-10-CM | POA: Diagnosis present

## 2019-04-26 DIAGNOSIS — L98499 Non-pressure chronic ulcer of skin of other sites with unspecified severity: Secondary | ICD-10-CM | POA: Diagnosis present

## 2019-04-26 DIAGNOSIS — Z452 Encounter for adjustment and management of vascular access device: Secondary | ICD-10-CM

## 2019-04-26 LAB — GLUCOSE, CAPILLARY
Glucose-Capillary: 99 mg/dL (ref 70–99)
Glucose-Capillary: 119 mg/dL — ABNORMAL HIGH (ref 70–99)

## 2019-04-26 LAB — PROTIME-INR
INR: 1.6 — ABNORMAL HIGH (ref 0.8–1.2)
Prothrombin Time: 18.8 seconds — ABNORMAL HIGH (ref 11.4–15.2)

## 2019-04-26 LAB — SURGICAL PCR SCREEN
MRSA, PCR: NEGATIVE
Staphylococcus aureus: NEGATIVE

## 2019-04-26 LAB — SARS CORONAVIRUS 2 BY RT PCR (HOSPITAL ORDER, PERFORMED IN ~~LOC~~ HOSPITAL LAB): SARS Coronavirus 2: NEGATIVE

## 2019-04-26 LAB — SARS CORONAVIRUS 2 (TAT 6-24 HRS): SARS Coronavirus 2: NEGATIVE

## 2019-04-26 LAB — LACTATE DEHYDROGENASE: LDH: 195 U/L — ABNORMAL HIGH (ref 98–192)

## 2019-04-26 MED ORDER — DOCUSATE SODIUM 100 MG PO CAPS
100.0000 mg | ORAL_CAPSULE | Freq: Every day | ORAL | Status: DC | PRN
  Administered 2019-04-28 – 2019-05-12 (×7): 100 mg via ORAL
  Filled 2019-04-26 (×8): qty 1

## 2019-04-26 MED ORDER — INSULIN GLARGINE 100 UNIT/ML ~~LOC~~ SOLN
10.0000 [IU] | Freq: Every day | SUBCUTANEOUS | Status: DC
  Administered 2019-04-26 – 2019-05-12 (×17): 10 [IU] via SUBCUTANEOUS
  Filled 2019-04-26 (×19): qty 0.1

## 2019-04-26 MED ORDER — CALCITRIOL 0.25 MCG PO CAPS
0.5000 ug | ORAL_CAPSULE | ORAL | Status: DC
  Administered 2019-04-26 – 2019-05-13 (×9): 0.5 ug via ORAL
  Filled 2019-04-26 (×7): qty 2

## 2019-04-26 MED ORDER — ACETAMINOPHEN 325 MG PO TABS
650.0000 mg | ORAL_TABLET | ORAL | Status: DC | PRN
  Administered 2019-04-30: 650 mg via ORAL
  Filled 2019-04-26: qty 2

## 2019-04-26 MED ORDER — GABAPENTIN 600 MG PO TABS
300.0000 mg | ORAL_TABLET | Freq: Two times a day (BID) | ORAL | Status: DC
  Administered 2019-04-26 – 2019-05-13 (×35): 300 mg via ORAL
  Filled 2019-04-26 (×35): qty 1

## 2019-04-26 MED ORDER — INSULIN ASPART 100 UNIT/ML ~~LOC~~ SOLN
0.0000 [IU] | Freq: Three times a day (TID) | SUBCUTANEOUS | Status: DC
  Administered 2019-04-27: 1 [IU] via SUBCUTANEOUS
  Administered 2019-04-30 – 2019-05-01 (×3): 3 [IU] via SUBCUTANEOUS
  Administered 2019-05-01: 18:00:00 5 [IU] via SUBCUTANEOUS
  Administered 2019-05-02 – 2019-05-03 (×2): 2 [IU] via SUBCUTANEOUS
  Administered 2019-05-04 – 2019-05-05 (×3): 1 [IU] via SUBCUTANEOUS
  Administered 2019-05-05: 2 [IU] via SUBCUTANEOUS
  Administered 2019-05-05: 06:00:00 1 [IU] via SUBCUTANEOUS
  Administered 2019-05-06: 2 [IU] via SUBCUTANEOUS
  Administered 2019-05-06: 1 [IU] via SUBCUTANEOUS
  Administered 2019-05-07 (×2): 2 [IU] via SUBCUTANEOUS
  Administered 2019-05-07: 1 [IU] via SUBCUTANEOUS
  Administered 2019-05-08 (×3): 2 [IU] via SUBCUTANEOUS
  Administered 2019-05-09: 12:00:00 7 [IU] via SUBCUTANEOUS
  Administered 2019-05-10: 2 [IU] via SUBCUTANEOUS
  Administered 2019-05-10 – 2019-05-11 (×3): 1 [IU] via SUBCUTANEOUS
  Administered 2019-05-12: 2 [IU] via SUBCUTANEOUS
  Administered 2019-05-13: 1 [IU] via SUBCUTANEOUS

## 2019-04-26 MED ORDER — OXYCODONE HCL 5 MG PO TABS
5.0000 mg | ORAL_TABLET | ORAL | Status: DC | PRN
  Administered 2019-04-26: 5 mg via ORAL
  Filled 2019-04-26: qty 1

## 2019-04-26 MED ORDER — TRAMADOL HCL 50 MG PO TABS
50.0000 mg | ORAL_TABLET | Freq: Four times a day (QID) | ORAL | Status: DC
  Administered 2019-04-26 – 2019-05-13 (×54): 50 mg via ORAL
  Filled 2019-04-26 (×54): qty 1

## 2019-04-26 MED ORDER — ACETAMINOPHEN 325 MG PO TABS: 650.0000 mg | ORAL_TABLET | ORAL | Status: DC | PRN

## 2019-04-26 MED ORDER — MIDODRINE HCL 5 MG PO TABS
5.0000 mg | ORAL_TABLET | ORAL | Status: DC
  Administered 2019-04-27 – 2019-05-13 (×9): 5 mg via ORAL
  Filled 2019-04-26 (×10): qty 1

## 2019-04-26 MED ORDER — LORAZEPAM 0.5 MG PO TABS
0.5000 mg | ORAL_TABLET | Freq: Two times a day (BID) | ORAL | Status: DC
  Administered 2019-04-26 – 2019-05-13 (×31): 0.5 mg via ORAL
  Filled 2019-04-26 (×32): qty 1

## 2019-04-26 MED ORDER — CITALOPRAM HYDROBROMIDE 20 MG PO TABS
20.0000 mg | ORAL_TABLET | Freq: Every day | ORAL | Status: DC
  Administered 2019-04-26 – 2019-05-13 (×18): 20 mg via ORAL
  Filled 2019-04-26 (×19): qty 1

## 2019-04-26 MED ORDER — INSULIN ASPART 100 UNIT/ML ~~LOC~~ SOLN
7.0000 [IU] | Freq: Every day | SUBCUTANEOUS | Status: DC
  Administered 2019-04-26 – 2019-05-12 (×17): 7 [IU] via SUBCUTANEOUS

## 2019-04-26 MED ORDER — ONDANSETRON HCL 4 MG/2ML IJ SOLN
4.0000 mg | Freq: Four times a day (QID) | INTRAMUSCULAR | Status: DC | PRN
  Administered 2019-04-27 – 2019-04-29 (×2): 4 mg via INTRAVENOUS
  Filled 2019-04-26 (×2): qty 2

## 2019-04-26 MED ORDER — HEPARIN (PORCINE) 25000 UT/250ML-% IV SOLN
1500.0000 [IU]/h | INTRAVENOUS | Status: DC
  Administered 2019-04-26: 1100 [IU]/h via INTRAVENOUS
  Administered 2019-04-27: 22:00:00 1250 [IU]/h via INTRAVENOUS
  Administered 2019-04-29 – 2019-04-30 (×2): 1300 [IU]/h via INTRAVENOUS
  Administered 2019-05-01 (×2): 1500 [IU]/h via INTRAVENOUS
  Filled 2019-04-26 (×7): qty 250

## 2019-04-26 MED ORDER — ERYTHROMYCIN BASE 250 MG PO TABS
250.0000 mg | ORAL_TABLET | Freq: Three times a day (TID) | ORAL | Status: DC
  Administered 2019-04-26 – 2019-05-13 (×50): 250 mg via ORAL
  Filled 2019-04-26 (×52): qty 1

## 2019-04-26 MED ORDER — RENA-VITE PO TABS
1.0000 | ORAL_TABLET | Freq: Every day | ORAL | Status: DC
  Administered 2019-04-27: 1 via ORAL
  Filled 2019-04-26 (×4): qty 1

## 2019-04-26 MED ORDER — ATORVASTATIN CALCIUM 40 MG PO TABS
40.0000 mg | ORAL_TABLET | Freq: Every day | ORAL | Status: DC
  Administered 2019-04-26 – 2019-05-12 (×17): 40 mg via ORAL
  Filled 2019-04-26 (×18): qty 1

## 2019-04-26 MED ORDER — PROMETHAZINE HCL 25 MG PO TABS: 12.5000 mg | ORAL_TABLET | Freq: Four times a day (QID) | ORAL | Status: DC | PRN

## 2019-04-26 MED ORDER — ASPIRIN EC 81 MG PO TBEC
81.0000 mg | DELAYED_RELEASE_TABLET | Freq: Every day | ORAL | Status: DC
  Administered 2019-04-26 – 2019-05-13 (×18): 81 mg via ORAL
  Filled 2019-04-26 (×18): qty 1

## 2019-04-26 MED ORDER — METOCLOPRAMIDE HCL 10 MG PO TABS
10.0000 mg | ORAL_TABLET | Freq: Three times a day (TID) | ORAL | Status: DC
  Administered 2019-04-26 – 2019-05-13 (×48): 10 mg via ORAL
  Filled 2019-04-26 (×48): qty 1

## 2019-04-26 NOTE — H&P (Addendum)
Advanced Heart Failure VAD History and Physical Note   PCP-Cardiologist: No primary care provider on file.   Reason for Admission: Ischemic Foot   HPI:   [name] is a 53 year old withCAD s/p anterior MI in 2010 and NSTEMI in 3/18 with DES to pRCA and mLAD, gastroparesis,ischemic cardiomyopathy now s/p Heartmate 3 LVAD and ESRD, factor V leiden, PAF, and ischemic foot.He has had multiple admits for N/V.   Admitted 7/10 with ischemic right foot. VVS consulted and on 7/13 he underwent peripheral angiogram that showed 80% stenosis r SFV and occluded right PT and had stent to right SFA and angioplasty to PT. He then had 4th toe amputation. He was discharged to home on 03/22/19.   Admitted 03/27/19 with recurrent n/v, leukocytosis and ischemic changes in right foot. Admit ICU for treatment of severe gastroparesis and possible ischemic foot.  VVS , Nephrology, ID  consulted.  He was treated with IV vancomycin for possible osteomyelitis/infection, but thought that more likely ischemia could explain all the findings.  He was discharged home to followup with vascular surgery.   He has been followed by Dr Donzetta Matters with plans for amputation next week. Today he was at dialysis and once he completed his session he developed severe pain in his right foot. Says he took oxycodone earlier today and has had not relief. He denies SOB/nasea/vomiting. Denies BRBPR. Unable to place weight on his right foot.    LVAD INTERROGATION:  HeartMate III LVAD:  Flow 3.9 liters/min, speed 5500, power 4.4  PI 5.4 .     Review of Systems: [y] = yes, [ ]  = no   General: Weight gain [ ] ; Weight loss [ ] ; Anorexia [ ] ; Fatigue [ Y]; Fever [ ] ; Chills [ ] ; Weakness [Y ]  Cardiac: Chest pain/pressure [ ] ; Resting SOB [ ] ; Exertional SOB [ ] ; Orthopnea [ ] ; Pedal Edema [ ] ; Palpitations [ ] ; Syncope [ ] ; Presyncope [ ] ; Paroxysmal nocturnal dyspnea[ ]   Pulmonary: Cough [ ] ; Wheezing[ ] ; Hemoptysis[ ] ; Sputum [ ] ; Snoring [ ]     GI: Vomiting[y ]; Dysphagia[ ] ; Melena[ ] ; Hematochezia [ ] ; Heartburn[ ] ; Abdominal pain Blue.Reese ]; Constipation [ ] ; Diarrhea [ ] ; BRBPR [ ]   GU: Hematuria[ ] ; Dysuria [ ] ; Nocturia[ ]   Vascular: Pain in legs with walking [ ] ; Pain in feet with lying flat [ ] ; Non-healing sores [ ] ; Stroke [ ] ; TIA [ ] ; Slurred speech [ ] ;  Neuro: Headaches[ ] ; Vertigo[ ] ; Seizures[ ] ; Paresthesias[ ] ;Blurred vision [ ] ; Diplopia [ ] ; Vision changes [ ]   Ortho/Skin: Arthritis [ y]; Joint pain [ y]; Muscle pain [ ] ; Joint swelling [ ] ; Back Pain [Y ]; Rash [ ]   Psych: DepressionY]; Anxiety[Y ]  Heme: Bleeding problems [ ] ; Clotting disorders [ ] ; Anemia [ ]   Endocrine: Diabetes [Y ]; Thyroid dysfunction[ ]     Home Medications Prior to Admission medications   Medication Sig Start Date End Date Taking? Authorizing Provider  acetaminophen (TYLENOL) 325 MG tablet Take 2 tablets (650 mg total) by mouth every 4 (four) hours as needed for headache or mild pain. 03/22/19  Yes Kroeger, Lorelee Cover., PA-C  aspirin EC 81 MG tablet Take 1 tablet (81 mg total) by mouth daily. 08/20/18  Yes Eileen Stanford, PA-C  atorvastatin (LIPITOR) 40 MG tablet Take 1 tablet (40 mg total) by mouth daily at 6 PM. 01/25/18  Yes [name], MD  calcitRIOL (ROCALTROL) 0.5 MCG capsule Take 1  capsule (0.5 mcg total) by mouth every Monday, Wednesday, and Friday. 06/06/18  Yes Georgiana Shore, NP  citalopram (CELEXA) 20 MG tablet Take 1 tablet (20 mg total) by mouth daily. 03/19/18  Yes Georgiana Shore, NP  diclofenac sodium (VOLTAREN) 1 % GEL Apply 4 g topically 4 (four) times daily. Patient taking differently: Apply 4 g topically 4 (four) times daily as needed (pain).  04/11/19  Yes Anderson, Chelsey L, DO  docusate sodium (COLACE) 100 MG capsule Take 1 capsule (100 mg total) by mouth daily as needed for mild constipation. 03/22/19  Yes Kroeger, Daleen Snook M., PA-C  dronabinol (MARINOL) 2.5 MG capsule Take 1 capsule (2.5 mg total) by mouth 2  (two) times daily before lunch and supper. 03/27/18  Yes Larey Dresser, MD  erythromycin (E-MYCIN) 250 MG tablet Take 1 tablet (250 mg total) by mouth 3 (three) times daily. 03/25/18  Yes Isaiah Serge, NP  gabapentin (NEURONTIN) 300 MG capsule Take 1 capsule (300 mg total) by mouth 2 (two) times daily. 04/16/19  Yes Larey Dresser, MD  insulin glargine (LANTUS) 100 UNIT/ML injection Inject 0.1 mLs (10 Units total) into the skin daily. Patient taking differently: Inject 10 Units into the skin at bedtime.  05/09/18  Yes Lovenia Kim, MD  LORazepam (ATIVAN) 0.5 MG tablet Take 1 tablet (0.5 mg total) by mouth every 12 (twelve) hours as needed (nausea). 03/27/18  Yes Larey Dresser, MD  metoCLOPramide (REGLAN) 10 MG tablet Take 1 tablet (10 mg total) by mouth 3 (three) times daily. Patient taking differently: Take 10 mg by mouth 3 (three) times daily with meals.  01/22/18  Yes Georgiana Shore, NP  midodrine (PROAMATINE) 5 MG tablet Take 1 tablet (5 mg total) by mouth daily. Take 1 tablet before dialysis treatment Patient taking differently: Take 5 mg by mouth every Monday, Wednesday, and Friday. Take 1 tablet before dialysis treatment 04/04/19  Yes Clegg, Amy D, NP  multivitamin (RENA-VIT) TABS tablet Take 1 tablet by mouth daily.   Yes [provider]  NOVOLOG 100 UNIT/ML injection INJECT 7 UNITS INTO THE SKIN DAILY WITH SUPPER. Patient taking differently: Inject 7 Units into the skin daily with supper.  03/15/19  Yes Anderson, Chelsey L, DO  oxyCODONE (ROXICODONE) 5 MG immediate release tablet Take 1-2 tablets every 4-6 hours for severe pain. Patient taking differently: Take 5 mg by mouth every 4 (four) hours as needed for severe pain.  03/22/19  Yes Kroeger, Daleen Snook M., PA-C  promethazine (PHENERGAN) 12.5 MG tablet Take 1 tablet (12.5 mg total) by mouth every 6 (six) hours as needed for nausea or vomiting. 10/23/18  Yes Larey Dresser, MD  traMADol (ULTRAM) 50 MG tablet Take 1 tablet (50 mg  total) by mouth every 6 (six) hours as needed for moderate pain. 08/18/18  Yes Eileen Stanford, PA-C  warfarin (COUMADIN) 5 MG tablet TAKE AS FOLLOWS: MON-WED-FRI 1&1/2 TABLETS DAILY. ON ALL OTHER DAYS 1 TABLET Patient taking differently: Take 5-7.5 mg by mouth one time only at 6 PM. 7.5 mg  Take Tuesday and Thursday 5 mg all the other days 09/12/18  Yes Larey Dresser, MD    Past Medical History: Past Medical History:  Diagnosis Date   Angina    ASCVD (arteriosclerotic cardiovascular disease)    , Anterior infarction 2005, LAD diagonal bifurcation intervention 03/2004   Automatic implantable cardiac defibrillator -St. Jude's        Benign neoplasm of colon    CHF (  congestive heart failure) (HCC)    Chronic systolic heart failure (HCC)    Coronary artery disease     Widely patent previously placed stents in the left anterior    Crohn's disease ([place])    Deep venous thrombosis (HCC)    Recurrent-on Coumadin   Dialysis patient ([facility])    Dyspnea    Gastroparesis    GERD (gastroesophageal reflux disease)    High cholesterol    Hyperlipidemia    Hypersomnolent    Previous diagnosis of narcolepsy   Hypertension, essential    Ischemic cardiomyopathy    Ejection fraction 15-20% catheterization 2010   Type II or unspecified type diabetes mellitus without mention of complication, not stated as uncontrolled    Unspecified gastritis and gastroduodenitis without mention of hemorrhage     Past Surgical History: Past Surgical History:  Procedure Laterality Date   A/V SHUNTOGRAM N/A 05/24/2018   Procedure: A/V SHUNTOGRAM;  Surgeon: Marty Heck, MD;  Location: [place] [facility];  Service: Cardiovascular;  Laterality: N/A;   ABDOMINAL AORTOGRAM W/LOWER EXTREMITY N/A 03/18/2019   Procedure: ABDOMINAL AORTOGRAM W/LOWER EXTREMITY;  Surgeon: Waynetta Sandy, MD;  Location: [place] [facility];  Service: Cardiovascular;  Laterality: N/A;   AMPUTATION  Right 03/21/2019   Procedure: AMPUTATION DIGIT RIGHT FOURTH TOE;  Surgeon: Waynetta Sandy, MD;  Location: [place];  Service: Vascular;  Laterality: Right;   APPENDECTOMY     AV FISTULA PLACEMENT Right 06/26/2017   Procedure: ARTERIOVENOUS (AV) FISTULA CREATION VERSUS GRAFT RIGHT  ARM;  Surgeon: Conrad [place]water, MD;  Location: [place];  Service: Vascular;  Laterality: Right;   AV FISTULA PLACEMENT Right 10/31/2017   Procedure: INSERTION OF ARTERIOVENOUS (AV) ARTEGRAFT,  RIGHT UPPER ARM;  Surgeon: Conrad [place], MD;  Location: [place];  Service: Vascular;  Laterality: Right;   AV FISTULA PLACEMENT Left 05/29/2018   Procedure: ARTERIOVENOUS (AV) FISTULA CREATION  LEFT UPPER EXTREMITY;  Surgeon: Waynetta Sandy, MD;  Location: [place];  Service: Vascular;  Laterality: Left;   AV FISTULA PLACEMENT Left 08/09/2018   Procedure: INSERTION OF ARTERIOVENOUS (AV) GORE-TEX GRAFT LEFT UPPER ARM;  Surgeon: Waynetta Sandy, MD;  Location: [place];  Service: Vascular;  Laterality: Left;   CARDIAC DEFIBRILLATOR PLACEMENT  2010   St. Jude ICD   CENTRAL LINE INSERTION Right 05/24/2018   Procedure: CENTRAL LINE INSERTION;  Surgeon: Marty Heck, MD;  Location: [place] [facility];  Service: Cardiovascular;  Laterality: Right;   COLECTOMY  ~ 2003   "for Crohn's" ascending colon.     CORONARY STENT INTERVENTION N/A 11/25/2016   Procedure: Coronary Stent Intervention;  Surgeon: Leonie Man, MD;  Location: [place] [facility];  Service: Cardiovascular;  Laterality: N/A;   EP IMPLANTABLE DEVICE N/A 09/14/2016   Procedure: ICD Generator Changeout;  Surgeon: Deboraha Sprang, MD;  Location: [place] [facility];  Service: Cardiovascular;  Laterality: N/A;   ESOPHAGOGASTRODUODENOSCOPY N/A 07/12/2017   Procedure: ESOPHAGOGASTRODUODENOSCOPY (EGD);  Surgeon: Jerene Bears, MD;  Location: [facility] ENDOSCOPY;  Service: Gastroenterology;  Laterality: N/A;  VAD pt so VAD team will need to accompany pt.     FETAL SURGERY FOR CONGENITAL HERNIA     ????? pt has no knowledge of this.[name]     FISTULOGRAM Left 08/09/2018   Procedure: FISTULOGRAM LEFT ARM;  Surgeon: Waynetta Sandy, MD;  Location: [place];  Service: Vascular;  Laterality: Left;   INGUINAL HERNIA REPAIR     INSERTION OF  DIALYSIS CATHETER Right 06/26/2017   Procedure: INSERTION OF TUNNELED  DIALYSIS CATHETER RIGHT INTERNAL JUGULAR;  Surgeon: Conrad [place], MD;  Location: [place];  Service: Vascular;  Laterality: Right;   INSERTION OF IMPLANTABLE LEFT VENTRICULAR ASSIST DEVICE N/A 06/12/2017   Procedure: INSERTION OF IMPLANTABLE LEFT VENTRICULAR ASSIST [place] 3;  Surgeon: Ivin Poot, MD;  Location: [place];  Service: Open Heart Surgery;  Laterality: N/A;  HM3 LVAD  CIRC ARREST  NITRIC OXIDE   INSERTION OF IMPLANTABLE LEFT VENTRICULAR ASSIST DEVICE N/A 02/28/2018   Procedure: REDO INSERTION OF IMPLANTABLE LEFT VENTRICULAR ASSIST DEVICE - HEARTMATE 3;  Surgeon: Ivin Poot, MD;  Location: [place];  Service: Open Heart Surgery;  Laterality: N/A;   IR REMOVAL TUN CV CATH W/O FL  02/26/2018   IR THORACENTESIS ASP PLEURAL SPACE W/IMG GUIDE  06/28/2017   LIGATION OF ARTERIOVENOUS  FISTULA Right 10/31/2017   Procedure: LIGATION OF BRACHIO-BASILIC VEIN TRANSPOSITION RIGHT ARM;  Surgeon: Conrad Bogata, MD;  Location: [place];  Service: Vascular;  Laterality: Right;   MULTIPLE EXTRACTIONS WITH ALVEOLOPLASTY N/A 06/08/2017   Procedure: MULTIPLE EXTRACTION WITH ALVEOLOPLASTY AND PRE PROSTHETIC SURGERY AS NEEDED;  Surgeon: Lenn Cal, DDS;  Location: [place];  Service: Oral Surgery;  Laterality: N/A;   PERIPHERAL VASCULAR BALLOON ANGIOPLASTY  03/18/2019   Procedure: PERIPHERAL VASCULAR BALLOON ANGIOPLASTY;  Surgeon: Waynetta Sandy, MD;  Location: [place] [facility];  Service: Cardiovascular;;  RT PT   PERIPHERAL VASCULAR INTERVENTION  03/18/2019   Procedure: PERIPHERAL VASCULAR INTERVENTION;  Surgeon: Waynetta Sandy, MD;  Location: [place] [facility];  Service: Cardiovascular;;  RT SFA   RIGHT HEART CATH N/A 06/07/2017   Procedure: RIGHT HEART CATH;  Surgeon: Larey Dresser, MD;  Location: [place] [facility];  Service: Cardiovascular;  Laterality: N/A;   RIGHT HEART CATH N/A 02/08/2018   Procedure: RIGHT HEART CATH;  Surgeon: Larey Dresser, MD;  Location: [place] [facility];  Service: Cardiovascular;  Laterality: N/A;   RIGHT HEART CATH N/A 08/09/2018   Procedure: RIGHT HEART CATH;  Surgeon: Larey Dresser, MD;  Location: [place] [facility];  Service: Cardiovascular;  Laterality: N/A;   RIGHT/LEFT HEART CATH AND CORONARY ANGIOGRAPHY N/A 11/23/2016   Procedure: Right/Left Heart Cath and Coronary Angiography;  Surgeon: Larey Dresser, MD;  Location: [place] [facility];  Service: Cardiovascular;  Laterality: N/A;   TEE WITHOUT CARDIOVERSION N/A 06/12/2017   Procedure: TRANSESOPHAGEAL ECHOCARDIOGRAM (TEE);  Surgeon: Prescott Gum, Collier Salina, MD;  Location: [place];  Service: Open Heart Surgery;  Laterality: N/A;   TEE WITHOUT CARDIOVERSION N/A 02/28/2018   Procedure: TRANSESOPHAGEAL ECHOCARDIOGRAM (TEE);  Surgeon: Prescott Gum, Collier Salina, MD;  Location: [place];  Service: Open Heart Surgery;  Laterality: N/A;   TEMPORARY DIALYSIS CATHETER Left 05/24/2018   Procedure: TEMPORARY DIALYSIS CATHETER;  Surgeon: Marty Heck, MD;  Location: [place] [facility];  Service: Cardiovascular;  Laterality: Left;   THROMBECTOMY AND REVISION OF ARTERIOVENTOUS (AV) GORETEX  GRAFT Right 12/12/2017   Procedure: THROMBECTOMY ARTERIOVENTOUS (AV) GORETEX  GRAFT RIGHT UPPER ARM;  Surgeon: Conrad Bynum, MD;  Location: [facility] OR;  Service: Vascular;  Laterality: Right;    Family History: Family History  Problem Relation Age of Onset   Colon polyps Mother    Diabetes Mother    Heart attack Father    Diabetes Father    Stroke Paternal Grandfather    Colitis Sister    Heart disease Brother  Colitis Sister     Colon cancer Neg Hx     Social History: Social History   Socioeconomic History   Marital status: Married    Spouse name: Not on file   Number of children: Not on file   Years of education: Not on file   Highest education level: Not on file  Occupational History   Not on file  Social Needs   Financial resource strain: Not on file   Food insecurity    Worry: Not on file    Inability: Not on file   Transportation needs    Medical: Not on file    Non-medical: Not on file  Tobacco Use   Smoking status: Never Smoker   Smokeless tobacco: Never Used  Substance and Sexual Activity   Alcohol use: No   Drug use: No   Sexual activity: Never  Lifestyle   Physical activity    Days per week: Not on file    Minutes per session: Not on file   Stress: Not on file  Relationships   Social connections    Talks on phone: Not on file    Gets together: Not on file    Attends religious service: Not on file    Active member of club or organization: Not on file    Attends meetings of clubs or organizations: Not on file    Relationship status: Not on file  Other Topics Concern   Not on file  Social History Narrative   Not on file    Allergies:  Allergies  Allergen Reactions   Metformin And Related Diarrhea    Objective:    Vital Signs:   Temp:  [99.2 F (37.3 C)-99.4 F (37.4 C)] 99.2 F (37.3 C) (08/21 1652) Pulse Rate:  [94-96] 96 (08/21 1652) Resp:  [14-17] 14 (08/21 1652) BP: (101-110)/(86-94) 101/86 (08/21 1626) SpO2:  [95 %-99 %] 95 % (08/21 1626) Weight:  [84.4 kg] 84.4 kg (08/21 1652)   Filed Weights   04/26/19 1652  Weight: 84.4 kg    Mean arterial Pressure 101  Physical Exam    General:  No resp difficulty HEENT: Normal Neck: supple. JVP flat . Carotids 2+ bilat; no bruits. No lymphadenopathy or thyromegaly appreciated. Cor: Mechanical heart sounds with LVAD hum present. Lungs: Clear Abdomen: soft, nontender, nondistended. No  hepatosplenomegaly. No bruits or masses. Good bowel sounds. Driveline: C/D/I; securement device intact and driveline incorporated Extremities: no cyanosis, clubbing, rash, edema. LLE Toes black  Neuro: alert & orientedx3, cranial nerves grossly intact. moves all 4 extremities w/o difficulty. Affect pleasant   Telemetry  SR 80s personally reviewed.    Labs    Basic Metabolic Panel: No results for input(s): NA, K, CL, CO2, GLUCOSE, BUN, CREATININE, CALCIUM, MG, PHOS in the last 168 hours.  Liver Function Tests: No results for input(s): AST, ALT, ALKPHOS, BILITOT, PROT, ALBUMIN in the last 168 hours. No results for input(s): LIPASE, AMYLASE in the last 168 hours. No results for input(s): AMMONIA in the last 168 hours.  CBC: No results for input(s): WBC, NEUTROABS, HGB, HCT, MCV, PLT in the last 168 hours.  Cardiac Enzymes: No results for input(s): CKTOTAL, CKMB, CKMBINDEX, TROPONINI in the last 168 hours.  BNP: BNP (last 3 results) No results for input(s): BNP in the last 8760 hours.  ProBNP (last 3 results) No results for input(s): PROBNP in the last 8760 hours.   CBG: Recent Labs  Lab 04/26/19 1624  GLUCAP 99    Coagulation Studies:  No results for input(s): LABPROT, INR in the last 72 hours.   Imaging    No results found.     Assessment/Plan:    1. R Ischemic Toes Plan for amputation of toes next week versus BKA. next week by Dr Donzetta Matters. Coumadin will be held until after surgery.  Continue oxycodone and ultram for pain.   2. Chronic systolic CHF: Ischemic cardiomyopathy. St Jude ICD. NSTEMI in March 2018 with DES to LAD and RCA, complicated by cardiogenic shock, low output requiring milrinone. Echo 8/18 with EF 20-25%, moderate [name]. CPX (8/18) with severe functional impairment due to HF. He is s/p HM3 LVAD placement. He had pump thrombosis 6/19 due to [facility] outflow graft kinking. He had pump exchange for another HM3. [id number].  - Volume managed at  dialysis.  - Continue ASA 81and warfarin with INR goal 2-2.5. - He [facility] for heart/kidney transplantand recently saw Dr Mosetta Pigeon =>he may not be a candidate for transplant now given extensive PAD.   3. ESRD Consult nephrology for HD. M-W-F with midodrine on HD days.   4.  CAD: NSTEMI in 3/18. LHC with 99% ulcerated lesion proximal RCA with left to right collaterals, 95% mid LAD stenosis after mid LAD stent. s/p PCI to RCA and LAD on 11/25/16.  - No chest pain.  - Continue statin and ASA.  5. h/o DVTs: Factor V Leiden heterozygote. -Anticoagulated.  6. Diabetic gastroparesis: He has seen gastroparesis specialist at [facility]. Surprisingly, gastric emptying study was normal. However, electrogastrogram showed poor gastric accomodation/capacity. Therefore, small frequent meals recommended. - Continue reglan 10 mg tid/ac and Marinol.  - Continue Protonix bid.  - He will continue erythromycinindefinitely.  - Limit narcotic use as much as possible.   6. DM:SSI and home insulin regimen.   7. GHW:EXHBZJ BP    Place central line due to difficult access. Check labs. Pharmacy consulted. Nephrology consulted. Follow INR/LDH daily.   I reviewed the LVAD parameters from today, and compared the results to the patient's prior recorded data.  No programming changes were made.  The LVAD is functioning within specified parameters.  The patient performs LVAD self-test daily.  LVAD interrogation was negative for any significant power changes, alarms or PI events/speed drops.  LVAD equipment check completed and is in good working order.  Back-up equipment present.   LVAD education done on emergency procedures and precautions and reviewed exit site care.  Length of Stay: 0  [name], MD 04/26/2019, 6:33 PM  VAD Team Pager [phone] (7am - 7am) +++VAD ISSUES ONLY+++   Advanced Heart Failure Team Pager [phone] (M-F; [place])  Please contact  [facility] for night-coverage after hours (4p -7a ) and weekends on [email or web address] for all non- LVAD Issues  Patient seen and examined with the above-signed Advanced Practice Provider and/or Housestaff. I personally reviewed laboratory data, imaging studies and relevant notes. I independently examined the patient and formulated the important aspects of the plan. I have edited the note to reflect any of my changes or salient points. I have personally discussed the plan with the patient and/or family.  53 y/o male with severe HF due to iCM s/p HM-III LVAD placement with ESRD, PAD s/p recent R toe amputation, severe gastroparesis with intractable n/v, chronic pain.  Multiple recent admits for intractable n/v and intolerance of ESRD as well as ischemic R foot.  Now presents with worsening R foot pain. Initially scheduled for elective admission on Sunday for R  transmet amp vs R BKS but presented today due to worsening pain.   On exam ill appearing HEENT: normal  Neck: supple. JVP not elevated.  Carotids 2+ bilat; no bruits. No lymphadenopathy or thryomegaly appreciated. Cor: LVAD hum.  Lungs: Clear. Abdomen: obese soft, nontender, non-distended. No hepatosplenomegaly. No bruits or masses. Good bowel sounds. Driveline site clean. Anchor in place.  Extremities: no cyanosis, clubbing, rash. Failed fistula in RUE. Ischemic R foot  Neuro: alert & oriented x 3. No focal deficits. Moves all 4 without problem   Will admit for pain control prior to RLE amputation. Low threshold to cover with abx. Need to minimize narcotics as much as possible due to gastroparesis and patient's dependence. Volume status stable. VAD interrogated personally. Parameters stable. Renal aware of admit. I will place central line. Start heparin once INR < 2.0   [name], MD  6:33 PM

## 2019-04-26 NOTE — CV Procedure (Signed)
Central Venous Catheter Insertion Procedure Note [name] [id number] 16-Sep-1965    Procedure: Insertion of Central Venous Catheter Indications: Drug and/or fluid administration   Procedure Details Consent: Risks of procedure as well as the alternatives and risks of each were explained to the (patient/caregiver).  Consent for procedure obtained. Time Out: Verified patient identification, verified procedure, site/side was marked, verified correct patient position, special equipment/implants available, medications/allergies/relevent history reviewed, required imaging and test results available.  Performed   Maximum sterile technique was used including antiseptics, cap, gloves, gown, hand hygiene, mask and sheet. Skin prep: Chlorhexidine; local anesthetic administered A antimicrobial bonded/coated triple lumen catheter was placed in the right internal jugular vein using the Seldinger technique and u/s guidance.   Evaluation Blood flow good Complications: No apparent complications Patient did tolerate procedure well. Chest X-ray ordered to verify placement.  CXR: pending  Of note, there was some resistance in the RIJ when passing the wire initially c/w stenosis from previous lines but we were able to get past it. This may not be the case next time.    [name], MD  8:11 PM

## 2019-04-26 NOTE — Plan of Care (Signed)
  Problem: Education: Goal: Patient will be able to verbalize current INR target range and antiplatelet therapy for discharge home Outcome: Progressing   Problem: Cardiac: Goal: LVAD will function as expected and patient will experience no clinical alarms Outcome: Progressing   Problem: Activity: Goal: Risk for activity intolerance will decrease Outcome: Progressing

## 2019-04-26 NOTE — Telephone Encounter (Signed)
Received call from patient stating pain in his right foot is severe and that Oxycodone is "not touching the pain." Will admit patient this afternoon vs Sunday for pain management. Patient aware of plan. Once bed available will have patient come to hospital. Patient verbalized understanding.   [name] RN [place] Coordinator  Office: [phone]  24/7 Pager: [phone]

## 2019-04-26 NOTE — Progress Notes (Signed)
ANTICOAGULATION CONSULT NOTE - Initial Consult  Pharmacy Consult for heparin Indication: LVAD  Allergies  Allergen Reactions  . Metformin And Related Diarrhea    Patient Measurements: Height: 5' 7"  (170.2 cm) Weight: 186 lb 1.1 oz (84.4 kg) IBW/kg (Calculated) : 66.1 Heparin Dosing Weight: 83.2 kg  Vital Signs: Temp: 98 F (36.7 C) (08/21 2100) Temp Source: Oral (08/21 2100) BP: 103/82 (08/21 2100) Pulse Rate: 83 (08/21 2100)  Labs: Recent Labs    04/26/19 2132  LABPROT 18.8*  INR 1.6*    CrCl cannot be calculated (Patient's most recent lab result is older than the maximum 21 days allowed.).   Medical History: Past Medical History:  Diagnosis Date  . Angina   . ASCVD (arteriosclerotic cardiovascular disease)    , Anterior infarction 2005, LAD diagonal bifurcation intervention 03/2004  . Automatic implantable cardiac defibrillator -[employer]       . Benign neoplasm of colon   . CHF (congestive heart failure) ([place])   . Chronic systolic heart failure ([place])   . Coronary artery disease     Widely patent previously placed stents in the left anterior   . Crohn's disease ([place])   . Deep venous thrombosis ([facility])    Recurrent-on Coumadin  . Dialysis patient ([place])   . Dyspnea   . Gastroparesis   . GERD (gastroesophageal reflux disease)   . High cholesterol   . Hyperlipidemia   . Hypersomnolent    Previous diagnosis of narcolepsy  . Hypertension, essential   . Ischemic cardiomyopathy    Ejection fraction 15-20% catheterization 2010  . Type II or unspecified type diabetes mellitus without mention of complication, not stated as uncontrolled   . Unspecified gastritis and gastroduodenitis without mention of hemorrhage     Medications:  Scheduled:  . aspirin EC  81 mg Oral Daily  . atorvastatin  40 mg Oral q1800  . calcitRIOL  0.5 mcg Oral Q M,W,F  . citalopram  20 mg Oral Daily  . erythromycin  250 mg Oral TID  . gabapentin  300 mg Oral BID  . insulin aspart   0-9 Units Subcutaneous TID WC  . insulin aspart  7 Units Subcutaneous Q supper  . insulin glargine  10 Units Subcutaneous QHS  . LORazepam  0.5 mg Oral BID  . metoCLOPramide  10 mg Oral TID WC  . midodrine  5 mg Oral Q M,W,F  . multivitamin  1 tablet Oral Daily  . traMADol  50 mg Oral Q6H    Assessment: 48 yom w/ hx Heartmate 3 LVAD, factor V Leiden, and PAF - presenting with severe pain (plan for amputation next week). On warfarin PTA - regimen was 5 mg daily except 7.5 mg on Tuesday; however, INR elevated so plan for 2.5 mg on Tues/5 mg on Wed and Thursday, hold Friday.   INR today is 1.6. Plan to start heparin infusion if INR<2. No CBC today - ordered with AM labs. No s/sx of bleeding.   Goal of Therapy:  Heparin level 0.3-0.7 units/ml INR 2-2.5 Monitor platelets by anticoagulation protocol: Yes   Plan:  Start heparin infusion at 1100 units/hr Check anti-Xa level in 8 hours and daily while on heparin Continue to monitor H&H and platelets  Antonietta Jewel, PharmD, BCCCP Clinical Pharmacist  Phone: [phone]  Please check AMION for all [place] phone numbers After 10:00 PM, call [place] [phone] 04/26/2019,9:50 PM

## 2019-04-26 NOTE — Progress Notes (Signed)
previous LVAD patient, R Ischemic Toes Plan for amputation of toes next week versus BKA. next week. NCM will follow for TOC needs.

## 2019-04-27 DIAGNOSIS — N186 End stage renal disease: Secondary | ICD-10-CM

## 2019-04-27 DIAGNOSIS — Z95811 Presence of heart assist device: Secondary | ICD-10-CM

## 2019-04-27 DIAGNOSIS — I998 Other disorder of circulatory system: Secondary | ICD-10-CM

## 2019-04-27 LAB — BASIC METABOLIC PANEL
Anion gap: 10 (ref 5–15)
BUN: 18 mg/dL (ref 6–20)
CO2: 28 mmol/L (ref 22–32)
Calcium: 8.8 mg/dL — ABNORMAL LOW (ref 8.9–10.3)
Chloride: 95 mmol/L — ABNORMAL LOW (ref 98–111)
Creatinine, Ser: 5.84 mg/dL — ABNORMAL HIGH (ref 0.61–1.24)
GFR calc Af Amer: 12 mL/min — ABNORMAL LOW (ref >60)
GFR calc non Af Amer: 10 mL/min — ABNORMAL LOW (ref >60)
Glucose, Bld: 71 mg/dL (ref 70–99)
Potassium: 4.2 mmol/L (ref 3.5–5.1)
Sodium: 133 mmol/L — ABNORMAL LOW (ref 135–145)

## 2019-04-27 LAB — SEDIMENTATION RATE: Sed Rate: 79 mm/hr — ABNORMAL HIGH (ref 0–16)

## 2019-04-27 LAB — PROTIME-INR
INR: 1.8 — ABNORMAL HIGH (ref 0.8–1.2)
Prothrombin Time: 20.7 seconds — ABNORMAL HIGH (ref 11.4–15.2)

## 2019-04-27 LAB — CBC
HCT: 34.3 % — ABNORMAL LOW (ref 39.0–52.0)
Hemoglobin: 10.6 g/dL — ABNORMAL LOW (ref 13.0–17.0)
MCH: 28.6 pg (ref 26.0–34.0)
MCHC: 30.9 g/dL (ref 30.0–36.0)
MCV: 92.5 fL (ref 80.0–100.0)
Platelets: 279 10*3/uL (ref 150–400)
RBC: 3.71 MIL/uL — ABNORMAL LOW (ref 4.22–5.81)
RDW: 15.4 % (ref 11.5–15.5)
WBC: 12.2 10*3/uL — ABNORMAL HIGH (ref 4.0–10.5)
nRBC: 0.4 % — ABNORMAL HIGH (ref 0.0–0.2)

## 2019-04-27 LAB — HEPARIN LEVEL (UNFRACTIONATED)
Heparin Unfractionated: 0.25 IU/mL — ABNORMAL LOW (ref 0.30–0.70)
Heparin Unfractionated: 0.15 IU/mL — ABNORMAL LOW (ref 0.30–0.70)
Heparin Unfractionated: 0.31 IU/mL (ref 0.30–0.70)

## 2019-04-27 LAB — LACTATE DEHYDROGENASE: LDH: 179 U/L (ref 98–192)

## 2019-04-27 MED ORDER — EPINEPHRINE PF 1 MG/ML IJ SOLN
INTRAMUSCULAR | Status: AC
  Filled 2019-04-27: qty 1

## 2019-04-27 MED ORDER — LIDOCAINE HCL (PF) 1 % IJ SOLN
INTRAMUSCULAR | Status: AC
  Administered 2019-04-27: 10:00:00
  Filled 2019-04-27: qty 5

## 2019-04-27 MED ORDER — ORAL CARE MOUTH RINSE
15.0000 mL | Freq: Two times a day (BID) | OROMUCOSAL | Status: DC
  Administered 2019-04-27 – 2019-05-12 (×16): 15 mL via OROMUCOSAL

## 2019-04-27 NOTE — Plan of Care (Signed)
  Problem: Cardiac: Goal: LVAD will function as expected and patient will experience no clinical alarms Outcome: Progressing   Problem: Clinical Measurements: Goal: Ability to maintain clinical measurements within normal limits will improve Outcome: Progressing

## 2019-04-27 NOTE — Plan of Care (Signed)
  Problem: Education: Goal: Patient will be able to verbalize current INR target range and antiplatelet therapy for discharge home Outcome: Progressing   Problem: Cardiac: Goal: LVAD will function as expected and patient will experience no clinical alarms Outcome: Progressing   Problem: Clinical Measurements: Goal: Ability to maintain clinical measurements within normal limits will improve Outcome: Progressing   Problem: Clinical Measurements: Goal: Ability to maintain clinical measurements within normal limits will improve Outcome: Progressing

## 2019-04-27 NOTE — Progress Notes (Addendum)
Advanced Heart Failure VAD Team Note  PCP-Cardiologist: No primary care provider on file.   Subjective:    Admitted for ischemic foot pain with need for amputation (transmet vs BKA) next week   Feels weak. Foot pain improved.. + intermittent n/v. No CP or SOB.  Was diaphoretic and had night sweats. Tmax 99.4. Bcx ordered but not drawn. WBC 12.2  INR 1.8 on heparin. Bleeding from central line site  LVAD INTERROGATION:  HeartMate 3 LVAD:   Flow 3.9L  liters/min, speed 5500, power 3.9, PI 6.4.    Objective:    Vital Signs:   Temp:  [97.6 F (36.4 C)-99.4 F (37.4 C)] 98.1 F (36.7 C) (08/22 0819) Pulse Rate:  [47-96] 69 (08/22 0000) Resp:  [14-24] 24 (08/22 0000) BP: (83-114)/(70-101) 95/78 (08/22 0819) SpO2:  [95 %-100 %] 100 % (08/22 0000) Weight:  [84.4 kg-87.1 kg] 87.1 kg (08/22 0500)   Mean arterial Pressure 70-80s  Intake/Output:   Intake/Output Summary (Last 24 hours) at 04/27/2019 0836 Last data filed at 04/27/2019 0500 Gross per 24 hour  Intake 739.6 ml  Output -  Net 739.6 ml     Physical Exam    General:  Weak appearing. No resp difficulty HEENT: normal Neck: supple. JVP 9-10 . RIJ TLC with significant bleeding Carotids 2+ bilat; no bruits. No lymphadenopathy or thyromegaly appreciated. Cor: Mechanical heart sounds with LVAD hum present. L chest wall perm cath Lungs: clear Abdomen: soft, nontender, nondistended. No hepatosplenomegaly. No bruits or masses. Good bowel sounds. Driveline: C/D/I; securement device intact and driveline incorporated Extremities: no cyanosis, clubbing, rash, edema R foot gangrene (no surrounding cellulits) Neuro: alert & orientedx3, cranial nerves grossly intact. moves all 4 extremities w/o difficulty. Affect pleasant   Telemetry   Sinus 60-70 Personally reviewed   Labs   Basic Metabolic Panel: Recent Labs  Lab 04/27/19 0552  NA 133*  K 4.2  CL 95*  CO2 28  GLUCOSE 71  BUN 18  CREATININE 5.84*  CALCIUM 8.8*     Liver Function Tests: No results for input(s): AST, ALT, ALKPHOS, BILITOT, PROT, ALBUMIN in the last 168 hours. No results for input(s): LIPASE, AMYLASE in the last 168 hours. No results for input(s): AMMONIA in the last 168 hours.  CBC: Recent Labs  Lab 04/27/19 0552  WBC 12.2*  HGB 10.6*  HCT 34.3*  MCV 92.5  PLT 279    INR: Recent Labs  Lab 04/26/19 2132 04/27/19 0552  INR 1.6* 1.8*    Other results:    Imaging   Dg Chest Port 1 View  Result Date: 04/26/2019 CLINICAL DATA:  Encounter for central line placement EXAM: PORTABLE CHEST 1 VIEW COMPARISON:  03/27/2019 FINDINGS: Patient has a RIGHT IJ central line, tip to the superior vena cava. A LEFT IJ dialysis catheter tip overlies the level of the superior vena cava. Patient has a LEFT-sided transvenous pacemaker with leads to the RIGHT atrium and RIGHT ventricle. LEFT ventricular assist device appears unchanged. No pneumothorax. Heart is mildly enlarged and stable in configuration. Stable elevation of the RIGHT hemidiaphragm. Lungs are clear. No edema. IMPRESSION: RIGHT IJ central line tip to the superior vena cava. No pneumothorax. Electronically Signed   By: Nolon Nations M.D.   On: 04/26/2019 20:23      Medications:     Scheduled Medications: . aspirin EC  81 mg Oral Daily  . atorvastatin  40 mg Oral q1800  . calcitRIOL  0.5 mcg Oral Q M,W,F  . citalopram  20  mg Oral Daily  . erythromycin  250 mg Oral TID  . gabapentin  300 mg Oral BID  . insulin aspart  0-9 Units Subcutaneous TID WC  . insulin aspart  7 Units Subcutaneous Q supper  . insulin glargine  10 Units Subcutaneous QHS  . LORazepam  0.5 mg Oral BID  . metoCLOPramide  10 mg Oral TID WC  . midodrine  5 mg Oral Q M,W,F  . multivitamin  1 tablet Oral Daily  . traMADol  50 mg Oral Q6H     Infusions: . heparin 1,100 Units/hr (04/27/19 0500)     PRN Medications:  acetaminophen, docusate sodium, ondansetron (ZOFRAN) IV, oxyCODONE,  promethazine  Assessment/Plan:    1. R Ischemic Toes - Plan for amputation of toes next week (R transmet versus BKA) - I suspect he will need BKA given level of gangrene to mid foot.  - Dr. Donzetta Matters following - Off coumadin. Heparin started.  - Continue oxycodone and ultram for pain. - With night sweats, diaphoresis, low-grade temps and leukocytosis I worry about infection - BCx x 2. ESR - Low threshold for abx   2. Chronic systolic CHF: Ischemic cardiomyopathy. St Jude ICD. NSTEMI in March 2018 with DES to LAD and RCA, complicated by cardiogenic shock, low output requiring milrinone. Echo 8/18 with EF 20-25%, moderate MR. CPX (8/18) with severe functional impairment due to HF. He is s/p HM3 LVAD placement. He had pump thrombosis 6/19 due to [facility] outflow graft kinking. He had pump exchange for another HM3. [id number].  - Volume managed at dialysis.  - Continue ASA 81and warfarin with INR goal 2-2.5. - Off coumadin. INR 1.8 Heparin started. Discussed dosing with PharmD personally.  - He has been referred to [facility] for evaluation for heart/kidney transplantand recently saw Dr Mosetta Pigeon =>he is not candidate for transplantnowgiven extensive PAD.   3. ESRD - HD on M-W-F with midodrine on HD days. Nephrology aware of admit  4.  CAD: NSTEMI in 3/18. LHC with 99% ulcerated lesion proximal RCA with left to right collaterals, 95% mid LAD stenosis after mid LAD stent. s/p PCI to RCA and LAD on 11/25/16.  - No s/s ischemia  - Continue statin and ASA.  5. h/o DVTs: Factor V Leiden heterozygote. -Anticoagulated.  6. Diabetic gastroparesis: He has seen gastroparesis specialist at [facility]. Surprisingly, gastric emptying study was normal. However, electrogastrogram showed poor gastric accomodation/capacity. Therefore, small frequent meals recommended. - Continue reglan 10 mg tid/ac and Marinol.  - Continue Protonix bid.  - He will continue erythromycinindefinitely.  - Limit  narcotic use as much as possible.   6. DM:SSI and home insulin regimen.   7. UMP:NTIRWE BP  - MAPs ok today   8. Bleeding from central line site - I will place suture and inject with epi/lido  I reviewed the LVAD parameters from today, and compared the results to the patient's prior recorded data.  No programming changes were made.  The LVAD is functioning within specified parameters.  The patient performs LVAD self-test daily.  LVAD interrogation was negative for any significant power changes, alarms or PI events/speed drops.  LVAD equipment check completed and is in good working order.  Back-up equipment present.   LVAD education done on emergency procedures and precautions and reviewed exit site care.  Length of Stay: 1  [name], MD 04/27/2019, 8:36 AM  VAD Team --- VAD ISSUES ONLY--- Pager [phone] (7am - 7am)  Advanced Heart Failure Team  Pager [phone] (M-F; 7a -  4p)  Please contact [facility] for night-coverage after hours (4p -7a ) and weekends on [email or web address]

## 2019-04-27 NOTE — Progress Notes (Signed)
RIJ central line bleeding.   I cleaned the site and injected with epi/lidocaine. Then placed a mattress suture around insertion site. Manual pressure held persoanlly x 5 mins. Hemostasis achieved. I applied a clean dressing.  [name], MD  10:10 AM

## 2019-04-27 NOTE — Progress Notes (Addendum)
ANTICOAGULATION CONSULT NOTE - Follow Up Consult  Pharmacy Consult for heparin Indication: LVAD  Allergies  Allergen Reactions  . Metformin And Related Diarrhea    Patient Measurements: Height: 5' 7"  (170.2 cm) Weight: 192 lb 0.3 oz (87.1 kg) IBW/kg (Calculated) : 66.1 Heparin Dosing Weight: 83.2 kg  Vital Signs: Temp: 98.4 F (36.9 C) (08/22 0500) Temp Source: Oral (08/22 0500) BP: 112/98 (08/22 0500) Pulse Rate: 69 (08/22 0000)  Labs: Recent Labs    04/26/19 2132 04/27/19 0552  HGB  --  10.6*  HCT  --  34.3*  PLT  --  279  LABPROT 18.8* 20.7*  INR 1.6* 1.8*  HEPARINUNFRC  --  0.25*  CREATININE  --  5.84*    Estimated Creatinine Clearance: 15.2 mL/min (A) (by C-G formula based on SCr of 5.84 mg/dL (H)).   Medical History: Past Medical History:  Diagnosis Date  . Angina   . ASCVD (arteriosclerotic cardiovascular disease)    , Anterior infarction 2005, LAD diagonal bifurcation intervention 03/2004  . Automatic implantable cardiac defibrillator -St. Jude's       . Benign neoplasm of colon   . CHF (congestive heart failure) ([place])   . Chronic systolic heart failure ([place])   . Coronary artery disease     Widely patent previously placed stents in the left anterior   . Crohn's disease ([place])   . Deep venous thrombosis ([facility])    Recurrent-on Coumadin  . Dialysis patient ([place])   . Dyspnea   . Gastroparesis   . GERD (gastroesophageal reflux disease)   . High cholesterol   . Hyperlipidemia   . Hypersomnolent    Previous diagnosis of narcolepsy  . Hypertension, essential   . Ischemic cardiomyopathy    Ejection fraction 15-20% catheterization 2010  . Type II or unspecified type diabetes mellitus without mention of complication, not stated as uncontrolled   . Unspecified gastritis and gastroduodenitis without mention of hemorrhage     Medications:  Scheduled:  . aspirin EC  81 mg Oral Daily  . atorvastatin  40 mg Oral q1800  . calcitRIOL  0.5 mcg Oral Q M,W,F   . citalopram  20 mg Oral Daily  . erythromycin  250 mg Oral TID  . gabapentin  300 mg Oral BID  . insulin aspart  0-9 Units Subcutaneous TID WC  . insulin aspart  7 Units Subcutaneous Q supper  . insulin glargine  10 Units Subcutaneous QHS  . LORazepam  0.5 mg Oral BID  . metoCLOPramide  10 mg Oral TID WC  . midodrine  5 mg Oral Q M,W,F  . multivitamin  1 tablet Oral Daily  . traMADol  50 mg Oral Q6H    Assessment: 23 yom w/ hx Heartmate 3 LVAD, factor V Leiden, and PAF - presenting with severe pain (plan for amputation next week). On warfarin PTA - regimen was 5 mg daily except 7.5 mg on Tuesday; however, INR elevated so plan for 2.5 mg on Tues, 5 mg on Wed and Thursday, hold Friday.   INR on 8/21 is 1.6. Heparin infusion initiated since INR<2.   Hgb 10.6, pltc wnl. No s/sx of bleeding.    0700 heparin level ordered by previous pharmacist was cancelled so heparin level this AM was collected early and therefore may be falsely low since infusion was started around 2330 last night.  Goal of Therapy:  Heparin level 0.3-0.7 units/ml INR 2-2.5 Monitor platelets by anticoagulation protocol: Yes     Addendum: Heparin level subtherapeutic  at 0.15. No infusion issues noted per RN. Did note IJ site has some bleeding. MD aware.   Plan:  Increase heparin infusion to 1250 units/hr Check heparin level in 8 hours Daily heparin level and CBC Monitor bleeding  Vertis Kelch, PharmD PGY2 Cardiology Pharmacy Resident Phone [phone] 04/27/2019       7:24 AM  Please check [email or web address] for unit-specific pharmacist phone numbers

## 2019-04-27 NOTE — Progress Notes (Signed)
ANTICOAGULATION CONSULT NOTE - Follow Up Consult  Pharmacy Consult for heparin Indication: LVAD  Allergies  Allergen Reactions  . Metformin And Related Diarrhea    Patient Measurements: Height: 5' 7"  (170.2 cm) Weight: 192 lb 0.3 oz (87.1 kg) IBW/kg (Calculated) : 66.1 Heparin Dosing Weight: 83.2 kg  Vital Signs: Temp: 98.3 F (36.8 C) (08/22 1600) Temp Source: Oral (08/22 1600) BP: 98/57 (08/22 1600) Pulse Rate: 82 (08/22 1600)  Labs: Recent Labs    04/26/19 2132 04/27/19 0552 04/27/19 0756 04/27/19 1645  HGB  --  10.6*  --   --   HCT  --  34.3*  --   --   PLT  --  279  --   --   LABPROT 18.8* 20.7*  --   --   INR 1.6* 1.8*  --   --   HEPARINUNFRC  --  0.25* 0.15* 0.31  CREATININE  --  5.84*  --   --     Estimated Creatinine Clearance: 15.2 mL/min (A) (by C-G formula based on SCr of 5.84 mg/dL (H)).   Assessment: 44 yom w/ hx Heartmate 3 LVAD, factor V Leiden, and PAF - presenting with severe pain (plan for amputation next week). On warfarin PTA - regimen was 5 mg daily except 7.5 mg on Tuesday; however, INR elevated so plan for 2.5 mg on Tues, 5 mg on Wed and Thursday, hold Friday.   INR on 8/21 is 1.6. Heparin infusion initiated since INR<2.   Heparin level is now therapeutic. No new bleeding issues noted.   Goal of Therapy:  Heparin level 0.3-0.7 units/ml INR 2-2.5 Monitor platelets by anticoagulation protocol: Yes   Plan:  Continue heparin infusion to 1250 units/hr Confirm dosing with am heparin level Daily heparin level and CBC Monitor bleeding  Salome Arnt, PharmD, BCPS Please see AMION for all pharmacy numbers 04/27/2019 6:05 PM

## 2019-04-28 LAB — BASIC METABOLIC PANEL
Anion gap: 12 (ref 5–15)
BUN: 31 mg/dL — ABNORMAL HIGH (ref 6–20)
CO2: 26 mmol/L (ref 22–32)
Calcium: 8.8 mg/dL — ABNORMAL LOW (ref 8.9–10.3)
Chloride: 93 mmol/L — ABNORMAL LOW (ref 98–111)
Creatinine, Ser: 8.38 mg/dL — ABNORMAL HIGH (ref 0.61–1.24)
GFR calc Af Amer: 8 mL/min — ABNORMAL LOW (ref >60)
GFR calc non Af Amer: 7 mL/min — ABNORMAL LOW (ref >60)
Glucose, Bld: 78 mg/dL (ref 70–99)
Potassium: 4.3 mmol/L (ref 3.5–5.1)
Sodium: 131 mmol/L — ABNORMAL LOW (ref 135–145)

## 2019-04-28 LAB — LACTATE DEHYDROGENASE: LDH: 177 U/L (ref 98–192)

## 2019-04-28 LAB — CBC
HCT: 32.7 % — ABNORMAL LOW (ref 39.0–52.0)
Hemoglobin: 10.1 g/dL — ABNORMAL LOW (ref 13.0–17.0)
MCH: 28.9 pg (ref 26.0–34.0)
MCHC: 30.9 g/dL (ref 30.0–36.0)
MCV: 93.7 fL (ref 80.0–100.0)
Platelets: 300 10*3/uL (ref 150–400)
RBC: 3.49 MIL/uL — ABNORMAL LOW (ref 4.22–5.81)
RDW: 15.6 % — ABNORMAL HIGH (ref 11.5–15.5)
WBC: 13.5 10*3/uL — ABNORMAL HIGH (ref 4.0–10.5)
nRBC: 0.2 % (ref 0.0–0.2)

## 2019-04-28 LAB — PROTIME-INR
INR: 1.8 — ABNORMAL HIGH (ref 0.8–1.2)
Prothrombin Time: 20.6 seconds — ABNORMAL HIGH (ref 11.4–15.2)

## 2019-04-28 LAB — HEPARIN LEVEL (UNFRACTIONATED)
Heparin Unfractionated: 0.3 IU/mL (ref 0.30–0.70)
Heparin Unfractionated: 0.41 IU/mL (ref 0.30–0.70)

## 2019-04-28 MED ORDER — OXYCODONE HCL 5 MG PO TABS
5.0000 mg | ORAL_TABLET | Freq: Four times a day (QID) | ORAL | Status: DC | PRN
  Administered 2019-04-30 – 2019-05-03 (×4): 5 mg via ORAL
  Filled 2019-04-28 (×4): qty 1

## 2019-04-28 MED ORDER — CHLORHEXIDINE GLUCONATE CLOTH 2 % EX PADS
6.0000 | MEDICATED_PAD | Freq: Every day | CUTANEOUS | Status: DC
  Administered 2019-04-29 – 2019-05-10 (×9): 6 via TOPICAL

## 2019-04-28 NOTE — Progress Notes (Signed)
ANTICOAGULATION CONSULT NOTE - Follow Up Consult  Pharmacy Consult for heparin Indication: LVAD  Allergies  Allergen Reactions  . Metformin And Related Diarrhea    Patient Measurements: Height: 5' 7"  (170.2 cm) Weight: 181 lb 7 oz (82.3 kg) IBW/kg (Calculated) : 66.1 Heparin Dosing Weight: 83.2 kg  Vital Signs: Temp: 98 F (36.7 C) (08/23 1600) Temp Source: Oral (08/23 1600) BP: 126/106 (08/23 1600)  Labs: Recent Labs    04/26/19 2132 04/27/19 0552  04/27/19 1645 04/28/19 0533 04/28/19 1657  HGB  --  10.6*  --   --  10.1*  --   HCT  --  34.3*  --   --  32.7*  --   PLT  --  279  --   --  300  --   LABPROT 18.8* 20.7*  --   --  20.6*  --   INR 1.6* 1.8*  --   --  1.8*  --   HEPARINUNFRC  --  0.25*   < > 0.31 0.30 0.41  CREATININE  --  5.84*  --   --  8.38*  --    < > = values in this interval not displayed.    Estimated Creatinine Clearance: 10.3 mL/min (A) (by C-G formula based on SCr of 8.38 mg/dL (H)).   Assessment: 56 yom w/ hx Heartmate 3 LVAD, factor V Leiden, and PAF - presenting with severe pain (plan for amputation this week). On warfarin PTA - regimen was 5 mg daily except 7.5 mg on Tuesday; however, INR elevated so plan for 2.5 mg on Tues, 5 mg on Wed and Thursday, hold Friday.   INR today is 1.8,  Warfarin is on hold due to plan for amputation of RLE.   Heparin level 0.41, remains therapeutic on heparin drip rate 1300 units/hr. No bleeding reported.   Goal of Therapy:  Heparin level 0.3-0.7 units/ml INR 2-2.5 Monitor platelets by anticoagulation protocol: Yes   Plan:  Continue heparin drip at 1300 units/hr Daily heparin level and CBC Monitor bleeding   Nicole Cella, RPh Clinical Pharmacist Please check AMION for all [place] phone numbers After 10:00 PM, call [place] [phone] 04/28/2019       5:35 PM  Please check [email or web address] for unit-specific pharmacist phone numbers

## 2019-04-28 NOTE — Plan of Care (Signed)
  Problem: Cardiac: Goal: LVAD will function as expected and patient will experience no clinical alarms Outcome: Progressing   Problem: Clinical Measurements: Goal: Ability to maintain clinical measurements within normal limits will improve Outcome: Progressing   Problem: Clinical Measurements: Goal: Will remain free from infection Outcome: Progressing   Problem: Coping: Goal: Level of anxiety will decrease Outcome: Progressing   Problem: Pain Managment: Goal: General experience of comfort will improve Outcome: Progressing   Problem: Safety: Goal: Ability to remain free from injury will improve Outcome: Progressing

## 2019-04-28 NOTE — Progress Notes (Signed)
Advanced Heart Failure VAD Team Note  PCP-Cardiologist: No primary care provider on file.   Subjective:    Admitted for ischemic foot pain with need for amputation (transmet vs BKA) next week   Sleepy this am. Denies CP or SOB. No nausea. Says foot hurt if he tries to stand  INR 1.8 on heparin. No longer bleeding from central line site  LVAD INTERROGATION:  HeartMate 3 LVAD:   Flow 3.9L  liters/min, speed 5500, power 4.0, PI 4.9    Objective:    Vital Signs:   Temp:  [97.5 F (36.4 C)-98.5 F (36.9 C)] 97.5 F (36.4 C) (08/23 0733) Pulse Rate:  [77-87] 78 (08/23 0500) Resp:  [10-12] 11 (08/23 0500) BP: (98-129)/(57-101) 112/99 (08/23 0500) SpO2:  [94 %-99 %] 94 % (08/23 0500) Weight:  [82.3 kg] 82.3 kg (08/23 0500)   Mean arterial Pressure 100   Intake/Output:   Intake/Output Summary (Last 24 hours) at 04/28/2019 0954 Last data filed at 04/28/2019 0734 Gross per 24 hour  Intake 1082.35 ml  Output -  Net 1082.35 ml     Physical Exam    General:  Weak appearing. No resp difficulty HEENT: normal Neck: supple. RIJ TLC . Carotids 2+ bilat; no bruits. No lymphadenopathy or thryomegaly appreciated. Cor: PMI nondisplaced. Regular rate & rhythm. No rubs, gallops or murmurs. Left chest perm cath Lungs: clear Abdomen: soft, nontender, nondistended. No hepatosplenomegaly. No bruits or masses. Good bowel sounds. Extremities: no cyanosis, clubbing, rash, edema gangrenous right foot Neuro: alert & orientedx3, cranial nerves grossly intact. moves all 4 extremities w/o difficulty. Affect pleasant   Telemetry   Sinus 70s Personally reviewed   Labs   Basic Metabolic Panel: Recent Labs  Lab 04/27/19 0552 04/28/19 0533  NA 133* 131*  K 4.2 4.3  CL 95* 93*  CO2 28 26  GLUCOSE 71 78  BUN 18 31*  CREATININE 5.84* 8.38*  CALCIUM 8.8* 8.8*    Liver Function Tests: No results for input(s): AST, ALT, ALKPHOS, BILITOT, PROT, ALBUMIN in the last 168 hours. No results  for input(s): LIPASE, AMYLASE in the last 168 hours. No results for input(s): AMMONIA in the last 168 hours.  CBC: Recent Labs  Lab 04/27/19 0552 04/28/19 0533  WBC 12.2* 13.5*  HGB 10.6* 10.1*  HCT 34.3* 32.7*  MCV 92.5 93.7  PLT 279 300    INR: Recent Labs  Lab 04/26/19 2132 04/27/19 0552 04/28/19 0533  INR 1.6* 1.8* 1.8*    Other results:    Imaging   Dg Chest Port 1 View  Result Date: 04/26/2019 CLINICAL DATA:  Encounter for central line placement EXAM: PORTABLE CHEST 1 VIEW COMPARISON:  03/27/2019 FINDINGS: Patient has a RIGHT IJ central line, tip to the superior vena cava. A LEFT IJ dialysis catheter tip overlies the level of the superior vena cava. Patient has a LEFT-sided transvenous pacemaker with leads to the RIGHT atrium and RIGHT ventricle. LEFT ventricular assist device appears unchanged. No pneumothorax. Heart is mildly enlarged and stable in configuration. Stable elevation of the RIGHT hemidiaphragm. Lungs are clear. No edema. IMPRESSION: RIGHT IJ central line tip to the superior vena cava. No pneumothorax. Electronically Signed   By: Nolon Nations M.D.   On: 04/26/2019 20:23     Medications:     Scheduled Medications: . aspirin EC  81 mg Oral Daily  . atorvastatin  40 mg Oral q1800  . calcitRIOL  0.5 mcg Oral Q M,W,F  . citalopram  20 mg Oral Daily  .  erythromycin  250 mg Oral TID  . gabapentin  300 mg Oral BID  . insulin aspart  0-9 Units Subcutaneous TID WC  . insulin aspart  7 Units Subcutaneous Q supper  . insulin glargine  10 Units Subcutaneous QHS  . LORazepam  0.5 mg Oral BID  . mouth rinse  15 mL Mouth Rinse BID  . metoCLOPramide  10 mg Oral TID WC  . midodrine  5 mg Oral Q M,W,F  . multivitamin  1 tablet Oral Daily  . traMADol  50 mg Oral Q6H    Infusions: . heparin 1,300 Units/hr (04/28/19 0736)    PRN Medications: acetaminophen, docusate sodium, ondansetron (ZOFRAN) IV, oxyCODONE, promethazine  Assessment/Plan:    1. R  Ischemic Toes - Plan for amputation of RLE next week (R transmet versus BKA) - I suspect he will need BKA given level of gangrene to mid foot.  - Dr. Donzetta Matters following - Off coumadin. Heparin started.  - Continue oxycodone and ultram for pain. Watch for oversedation (wil cut back a bit) - With night sweats, diaphoresis, low-grade temps and leukocytosis I worry about infection - BCx x 2 - NGTD . ESR 79. AF (Tmax 98.5) - Low threshold for abx   2. Chronic systolic CHF: Ischemic cardiomyopathy. [employer] ICD. NSTEMI in March 2018 with DES to LAD and RCA, complicated by cardiogenic shock, low output requiring milrinone. Echo 8/18 with EF 20-25%, moderate MR. CPX (8/18) with severe functional impairment due to HF. He is s/p HM3 LVAD placement. He had pump thrombosis 6/19 due to [facility] outflow graft kinking. He had pump exchange for another HM3. [id number].  - Volume managed at dialysis.  - Continue ASA 81and warfarin with INR goal 2-2.5. - Off coumadin. INR 1.8 Heparin started. Discussed dosing with PharmD personally. - He has been [facility] to [facility] for evaluation for heart/kidney transplantand recently saw Dr Mosetta Pigeon =>he is not candidate for transplantnowgiven extensive PAD.   3. ESRD - HD on M-W-F with midodrine on HD days. - D/w Dr. Jonnie Finner  4.  CAD: NSTEMI in 3/18. LHC with 99% ulcerated lesion proximal RCA with left to right collaterals, 95% mid LAD stenosis after mid LAD stent. s/p PCI to RCA and LAD on 11/25/16.  - No s/s ischemia  - Continue statin and ASA.  5. h/o DVTs: Factor V Leiden heterozygote. -Anticoagulated.  6. Diabetic gastroparesis: He has seen gastroparesis specialist at [facility]. Surprisingly, gastric emptying study was normal. However, electrogastrogram showed poor gastric accomodation/capacity. Therefore, small frequent meals recommended. - Continue reglan 10 mg tid/ac and Marinol.  - Continue Protonix bid.  - He will continue  erythromycinindefinitely.  - Limit narcotic use as much as possible.   6. DM:SSI and home insulin regimen.   7. KGY:JEHUDJ BP  - MAPs ok today   8. Bleeding from central line site - Improved with suture  I reviewed the LVAD parameters from today, and compared the results to the patient's prior recorded data.  No programming changes were made.  The LVAD is functioning within specified parameters.  The patient performs LVAD self-test daily.  LVAD interrogation was negative for any significant power changes, alarms or PI events/speed drops.  LVAD equipment check completed and is in good working order.  Back-up equipment present.   LVAD education done on emergency procedures and precautions and reviewed exit site care.  Length of Stay: 2  [name], MD 04/28/2019, 9:54 AM  VAD Team --- VAD ISSUES ONLY--- Pager [phone] (7am - 7am)  Advanced Heart Failure Team  Pager [phone] (M-F; Nina)  Please contact [facility] for night-coverage after hours (4p -7a ) and weekends on [email or web address]

## 2019-04-28 NOTE — Consult Note (Addendum)
[facility] Renal Consultation Note    Indication for Consultation:  Management of ESRD/hemodialysis, anemia, hypertension/volume, and secondary hyperparathyroidism. PCP:  HPI: [name] is a 53 y.o. male with ESRD, CAD, severe ICM (s/p Heartmate 3 LVAD), Type 2 DM with severe gastroparesis, paroxymal A-fib, Factor V Leiden, HTN, GERD who was admitted to the HF service for pain control of ischemic R foot prior to planned amputation on 8/25.  Has been having ongoing issues with ischemic R foot for the past 2 months. S/p RLE angioplasty 7/13, underwent R 4th toe amputation afterwards. Returned back 7/22 with ^ WBC and evolving ischemia. Treated for possible osteo. Vascular surgery office visit on 8/14 showed progressive ischemic changes (dry gangrene) - was sched for R TMA v. BKA  and L great toe amputation on 8/25. On 8/21 - pain became severe and not controlled with current meds, so he was admitted for pain control and heparin bridging prior to procedure.  Today, seen in hospital bed. Pain improved from when he was at home. No CP, dyspnea, N/V, fever, chills.  Dialyzes on MWF schedule at [facility] - last HD 8/21. No recent HD issues - using L TDC for now.  Past Medical History:  Diagnosis Date  . Angina   . ASCVD (arteriosclerotic cardiovascular disease)    , Anterior infarction 2005, LAD diagonal bifurcation intervention 03/2004  . Automatic implantable cardiac defibrillator -St. Jude's       . Benign neoplasm of colon   . CHF (congestive heart failure) ([place])   . Chronic systolic heart failure ([place])   . Coronary artery disease     Widely patent previously placed stents in the left anterior   . Crohn's disease ([place])   . Deep venous thrombosis (HCC)    Recurrent-on Coumadin  . Dialysis patient ([place])   . Dyspnea   . Gastroparesis   . GERD (gastroesophageal reflux disease)   . High cholesterol   . Hyperlipidemia   . Hypersomnolent    Previous diagnosis of narcolepsy   . Hypertension, essential   . Ischemic cardiomyopathy    Ejection fraction 15-20% catheterization 2010  . Type II or unspecified type diabetes mellitus without mention of complication, not stated as uncontrolled   . Unspecified gastritis and gastroduodenitis without mention of hemorrhage    Past Surgical History:  Procedure Laterality Date  . A/V SHUNTOGRAM N/A 05/24/2018   Procedure: A/V SHUNTOGRAM;  Surgeon: Marty Heck, MD;  Location: [place] [facility];  Service: Cardiovascular;  Laterality: N/A;  . ABDOMINAL AORTOGRAM W/LOWER EXTREMITY N/A 03/18/2019   Procedure: ABDOMINAL AORTOGRAM W/LOWER EXTREMITY;  Surgeon: Waynetta Sandy, MD;  Location: [place] [facility];  Service: Cardiovascular;  Laterality: N/A;  . AMPUTATION Right 03/21/2019   Procedure: AMPUTATION DIGIT RIGHT FOURTH TOE;  Surgeon: Waynetta Sandy, MD;  Location: [place];  Service: Vascular;  Laterality: Right;  . APPENDECTOMY    . AV FISTULA PLACEMENT Right 06/26/2017   Procedure: ARTERIOVENOUS (AV) FISTULA CREATION VERSUS GRAFT RIGHT  ARM;  Surgeon: Conrad Lincolnshire, MD;  Location: [place];  Service: Vascular;  Laterality: Right;  . AV FISTULA PLACEMENT Right 10/31/2017   Procedure: INSERTION OF ARTERIOVENOUS (AV) ARTEGRAFT,  RIGHT UPPER ARM;  Surgeon: Conrad Seward, MD;  Location: [place];  Service: Vascular;  Laterality: Right;  . AV FISTULA PLACEMENT Left 05/29/2018   Procedure: ARTERIOVENOUS (AV) FISTULA CREATION  LEFT UPPER EXTREMITY;  Surgeon: Waynetta Sandy, MD;  Location: [place];  Service: Vascular;  Laterality: Left;  . AV FISTULA PLACEMENT Left 08/09/2018   Procedure: INSERTION OF ARTERIOVENOUS (AV) GORE-TEX GRAFT LEFT UPPER ARM;  Surgeon: Waynetta Sandy, MD;  Location: [place];  Service: Vascular;  Laterality: Left;  . CARDIAC DEFIBRILLATOR PLACEMENT  2010   St. Jude ICD  . CENTRAL LINE INSERTION Right 05/24/2018   Procedure: CENTRAL LINE INSERTION;  Surgeon: Marty Heck, MD;  Location: [place] [facility];  Service: Cardiovascular;  Laterality: Right;  . COLECTOMY  ~ 2003   "for Crohn's" ascending colon.    . CORONARY STENT INTERVENTION N/A 11/25/2016   Procedure: Coronary Stent Intervention;  Surgeon: Leonie Man, MD;  Location: [place] [facility];  Service: Cardiovascular;  Laterality: N/A;  . EP IMPLANTABLE DEVICE N/A 09/14/2016   Procedure: ICD Generator Changeout;  Surgeon: Deboraha Sprang, MD;  Location: [place] [facility];  Service: Cardiovascular;  Laterality: N/A;  . ESOPHAGOGASTRODUODENOSCOPY N/A 07/12/2017   Procedure: ESOPHAGOGASTRODUODENOSCOPY (EGD);  Surgeon: Jerene Bears, MD;  Location: [facility] ENDOSCOPY;  Service: Gastroenterology;  Laterality: N/A;  VAD pt so VAD team will need to accompany pt.   [name] FETAL SURGERY FOR CONGENITAL HERNIA     ????? pt has no knowledge of this..    . FISTULOGRAM Left 08/09/2018   Procedure: FISTULOGRAM LEFT ARM;  Surgeon: Waynetta Sandy, MD;  Location: [place];  Service: Vascular;  Laterality: Left;  . INGUINAL HERNIA REPAIR    . INSERTION OF DIALYSIS CATHETER Right 06/26/2017   Procedure: INSERTION OF TUNNELED  DIALYSIS CATHETER RIGHT INTERNAL JUGULAR;  Surgeon: Conrad St. Hedwig, MD;  Location: [place];  Service: Vascular;  Laterality: Right;  . INSERTION OF IMPLANTABLE LEFT VENTRICULAR ASSIST DEVICE N/A 06/12/2017   Procedure: INSERTION OF IMPLANTABLE LEFT VENTRICULAR ASSIST [place] 3;  Surgeon: Ivin Poot, MD;  Location: [place];  Service: Open Heart Surgery;  Laterality: N/A;  HM3 LVAD  CIRC ARREST  NITRIC OXIDE  . INSERTION OF IMPLANTABLE LEFT VENTRICULAR ASSIST DEVICE N/A 02/28/2018   Procedure: REDO INSERTION OF IMPLANTABLE LEFT VENTRICULAR ASSIST DEVICE - HEARTMATE 3;  Surgeon: Ivin Poot, MD;  Location: [place];  Service: Open Heart Surgery;  Laterality: N/A;  . IR REMOVAL TUN CV CATH W/O FL  02/26/2018  . IR THORACENTESIS ASP PLEURAL SPACE W/IMG GUIDE  06/28/2017  . LIGATION OF  ARTERIOVENOUS  FISTULA Right 10/31/2017   Procedure: LIGATION OF BRACHIO-BASILIC VEIN TRANSPOSITION RIGHT ARM;  Surgeon: Conrad , MD;  Location: [place];  Service: Vascular;  Laterality: Right;  . MULTIPLE EXTRACTIONS WITH ALVEOLOPLASTY N/A 06/08/2017   Procedure: MULTIPLE EXTRACTION WITH ALVEOLOPLASTY AND PRE PROSTHETIC SURGERY AS NEEDED;  Surgeon: Lenn Cal, DDS;  Location: [place];  Service: Oral Surgery;  Laterality: N/A;  . PERIPHERAL VASCULAR BALLOON ANGIOPLASTY  03/18/2019   Procedure: PERIPHERAL VASCULAR BALLOON ANGIOPLASTY;  Surgeon: Waynetta Sandy, MD;  Location: [place] [facility];  Service: Cardiovascular;;  RT PT  . PERIPHERAL VASCULAR INTERVENTION  03/18/2019   Procedure: PERIPHERAL VASCULAR INTERVENTION;  Surgeon: Waynetta Sandy, MD;  Location: [place] [facility];  Service: Cardiovascular;;  RT SFA  . RIGHT HEART CATH N/A 06/07/2017   Procedure: RIGHT HEART CATH;  Surgeon: Larey Dresser, MD;  Location: [place] [facility];  Service: Cardiovascular;  Laterality: N/A;  . RIGHT HEART CATH N/A 02/08/2018   Procedure: RIGHT HEART CATH;  Surgeon: Larey Dresser, MD;  Location: [place] [facility];  Service: Cardiovascular;  Laterality: N/A;  .  RIGHT HEART CATH N/A 08/09/2018   Procedure: RIGHT HEART CATH;  Surgeon: Larey Dresser, MD;  Location: [place] [facility];  Service: Cardiovascular;  Laterality: N/A;  . RIGHT/LEFT HEART CATH AND CORONARY ANGIOGRAPHY N/A 11/23/2016   Procedure: Right/Left Heart Cath and Coronary Angiography;  Surgeon: Larey Dresser, MD;  Location: [place] [facility];  Service: Cardiovascular;  Laterality: N/A;  . TEE WITHOUT CARDIOVERSION N/A 06/12/2017   Procedure: TRANSESOPHAGEAL ECHOCARDIOGRAM (TEE);  Surgeon: Prescott Gum, Collier Salina, MD;  Location: [place];  Service: Open Heart Surgery;  Laterality: N/A;  . TEE WITHOUT CARDIOVERSION N/A 02/28/2018   Procedure: TRANSESOPHAGEAL ECHOCARDIOGRAM (TEE);  Surgeon: Prescott Gum, Collier Salina, MD;  Location:  [place];  Service: Open Heart Surgery;  Laterality: N/A;  . TEMPORARY DIALYSIS CATHETER Left 05/24/2018   Procedure: TEMPORARY DIALYSIS CATHETER;  Surgeon: Marty Heck, MD;  Location: [place] [facility];  Service: Cardiovascular;  Laterality: Left;  . THROMBECTOMY AND REVISION OF ARTERIOVENTOUS (AV) GORETEX  GRAFT Right 12/12/2017   Procedure: THROMBECTOMY ARTERIOVENTOUS (AV) GORETEX  GRAFT RIGHT UPPER ARM;  Surgeon: Conrad Page, MD;  Location: [facility] OR;  Service: Vascular;  Laterality: Right;   Family History  Problem Relation Age of Onset  . Colon polyps Mother   . Diabetes Mother   . Heart attack Father   . Diabetes Father   . Stroke Paternal Grandfather   . Colitis Sister   . Heart disease Brother   . Colitis Sister   . Colon cancer Neg Hx    Social History:  reports that he has never smoked. He has never used smokeless tobacco. He reports that he does not drink alcohol or use drugs.  ROS: As per HPI otherwise negative.  Physical Exam: Vitals:   04/27/19 2336 04/28/19 0000 04/28/19 0500 04/28/19 0733  BP: (!) 113/101  (!) 112/99   Pulse: 77 82 78   Resp: 12 11 11    Temp: 98.5 F (36.9 C)  98.1 F (36.7 C) (!) 97.5 F (36.4 C)  TempSrc: Oral  Oral Oral  SpO2: 96% 98% 94%   Weight:   82.3 kg   Height:         General: Well developed, well nourished, in no acute distress. Head: Normocephalic, atraumatic, sclera non-icteric, mucus membranes are moist. Neck: Supple without lymphadenopathy/masses. JVD not elevated. Lungs: Clear bilaterally to auscultation without wheezes, rales, or rhonchi. Breathing is unlabored. Heart: Continuous hum from LVAD. Abdomen: Soft, non-tender, non-distended with normoactive bowel sounds.  Musculoskeletal:  Strength and tone appear normal for age. Lower extremities: No LE edema. Blackened/dry gangrene to distal R foot involving all toes and half of the dorsal surface. L great toe with blackened appearance as well. Neuro: Alert and oriented X  3. Moves all extremities spontaneously. Psych:  Responds to questions appropriately with a normal affect. Dialysis Access: TDC in L chest  Allergies  Allergen Reactions  . Metformin And Related Diarrhea   Prior to Admission medications   Medication Sig Start Date End Date Taking? Authorizing Provider  acetaminophen (TYLENOL) 325 MG tablet Take 2 tablets (650 mg total) by mouth every 4 (four) hours as needed for headache or mild pain. 03/22/19  Yes Kroeger, Lorelee Cover., PA-C  aspirin EC 81 MG tablet Take 1 tablet (81 mg total) by mouth daily. 08/20/18  Yes Eileen Stanford, PA-C  atorvastatin (LIPITOR) 40 MG tablet Take 1 tablet (40 mg total) by mouth daily at 6 PM. 01/25/18  Yes [name], MD  calcitRIOL (ROCALTROL) 0.5 MCG capsule Take 1 capsule (0.5 mcg total) by mouth every Monday, Wednesday, and Friday. 06/06/18  Yes Georgiana Shore, NP  citalopram (CELEXA) 20 MG tablet Take 1 tablet (20 mg total) by mouth daily. 03/19/18  Yes Georgiana Shore, NP  diclofenac sodium (VOLTAREN) 1 % GEL Apply 4 g topically 4 (four) times daily. Patient taking differently: Apply 4 g topically 4 (four) times daily as needed (pain).  04/11/19  Yes Anderson, Chelsey L, DO  docusate sodium (COLACE) 100 MG capsule Take 1 capsule (100 mg total) by mouth daily as needed for mild constipation. 03/22/19  Yes Kroeger, Daleen Snook M., PA-C  dronabinol (MARINOL) 2.5 MG capsule Take 1 capsule (2.5 mg total) by mouth 2 (two) times daily before lunch and supper. 03/27/18  Yes Larey Dresser, MD  erythromycin (E-MYCIN) 250 MG tablet Take 1 tablet (250 mg total) by mouth 3 (three) times daily. 03/25/18  Yes Isaiah Serge, NP  gabapentin (NEURONTIN) 300 MG capsule Take 1 capsule (300 mg total) by mouth 2 (two) times daily. 04/16/19  Yes Larey Dresser, MD  insulin glargine (LANTUS) 100 UNIT/ML injection Inject 0.1 mLs (10 Units total) into the skin daily. Patient taking differently: Inject 10 Units into the skin at  bedtime.  05/09/18  Yes Lovenia Kim, MD  LORazepam (ATIVAN) 0.5 MG tablet Take 1 tablet (0.5 mg total) by mouth every 12 (twelve) hours as needed (nausea). 03/27/18  Yes Larey Dresser, MD  metoCLOPramide (REGLAN) 10 MG tablet Take 1 tablet (10 mg total) by mouth 3 (three) times daily. Patient taking differently: Take 10 mg by mouth 3 (three) times daily with meals.  01/22/18  Yes Georgiana Shore, NP  midodrine (PROAMATINE) 5 MG tablet Take 1 tablet (5 mg total) by mouth daily. Take 1 tablet before dialysis treatment Patient taking differently: Take 5 mg by mouth every Monday, Wednesday, and Friday. Take 1 tablet before dialysis treatment 04/04/19  Yes [place], Amy D, NP  multivitamin (RENA-VIT) TABS tablet Take 1 tablet by mouth daily.   Yes [provider]  NOVOLOG 100 UNIT/ML injection INJECT 7 UNITS INTO THE SKIN DAILY WITH SUPPER. Patient taking differently: Inject 7 Units into the skin daily with supper.  03/15/19  Yes Anderson, Chelsey L, DO  oxyCODONE (ROXICODONE) 5 MG immediate release tablet Take 1-2 tablets every 4-6 hours for severe pain. Patient taking differently: Take 5 mg by mouth every 4 (four) hours as needed for severe pain.  03/22/19  Yes Kroeger, Daleen Snook M., PA-C  promethazine (PHENERGAN) 12.5 MG tablet Take 1 tablet (12.5 mg total) by mouth every 6 (six) hours as needed for nausea or vomiting. 10/23/18  Yes Larey Dresser, MD  traMADol (ULTRAM) 50 MG tablet Take 1 tablet (50 mg total) by mouth every 6 (six) hours as needed for moderate pain. 08/18/18  Yes Eileen Stanford, PA-C  warfarin (COUMADIN) 5 MG tablet TAKE AS FOLLOWS: MON-WED-FRI 1&1/2 TABLETS DAILY. ON ALL OTHER DAYS 1 TABLET Patient taking differently: Take 5-7.5 mg by mouth one time only at 6 PM. 7.5 mg  Take Tuesday and Thursday 5 mg all the other days 09/12/18  Yes Larey Dresser, MD   Current Facility-Administered Medications  Medication Dose Route Frequency Provider Last Rate Last Dose  . acetaminophen  (TYLENOL) tablet 650 mg  650 mg Oral Q4H PRN [place], Amy D, NP      . aspirin EC tablet 81 mg  81 mg Oral Daily [place], Amy  D, NP   81 mg at 04/28/19 0826  . atorvastatin (LIPITOR) tablet 40 mg  40 mg Oral q1800 Cle[place], Amy D, NP   40 mg at 04/27/19 1633  . calcitRIOL (ROCALTROL) capsule 0.5 mcg  0.5 mcg Oral Q M,W,F [place], Amy D, NP   0.5 mcg at 04/26/19 1535  . citalopram (CELEXA) tablet 20 mg  20 mg Oral Daily [place], Amy D, NP   20 mg at 04/28/19 0825  . docusate sodium (COLACE) capsule 100 mg  100 mg Oral Daily PRN [place], Amy D, NP   100 mg at 04/28/19 0826  . erythromycin (E-MYCIN) tablet 250 mg  250 mg Oral TID Ninfa Meeker, Amy D, NP   250 mg at 04/28/19 0826  . gabapentin (NEURONTIN) tablet 300 mg  300 mg Oral BID [place], Amy D, NP   300 mg at 04/28/19 0825  . heparin ADULT infusion 100 units/mL (25000 units/264m sodium chloride 0.45%)  1,300 Units/hr Intravenous Continuous MRonna Polio RPH 13 mL/hr at 04/28/19 0736 1,300 Units/hr at 04/28/19 0736  . insulin aspart (novoLOG) injection 0-9 Units  0-9 Units Subcutaneous TID WC [place], Amy D, NP   1 Units at 04/27/19 1635  . insulin aspart (novoLOG) injection 7 Units  7 Units Subcutaneous Q supper CNinfa Meeker Amy D, NP   7 Units at 04/27/19 1635  . insulin glargine (LANTUS) injection 10 Units  10 Units Subcutaneous QHS [place], Amy D, NP   10 Units at 04/27/19 2144  . LORazepam (ATIVAN) tablet 0.5 mg  0.5 mg Oral BID [place], Amy D, NP   0.5 mg at 04/28/19 0825  . MEDLINE mouth rinse  15 mL Mouth Rinse BID MLarey Dresser MD   15 mL at 04/28/19 04166 . metoCLOPramide (REGLAN) tablet 10 mg  10 mg Oral TID WC [place], Amy D, NP   10 mg at 04/28/19 0605  . midodrine (PROAMATINE) tablet 5 mg  5 mg Oral Q M,W,F Cle[place], Amy D, NP   5 mg at 04/27/19 0826  . multivitamin (RENA-VIT) tablet 1 tablet  1 tablet Oral Daily [place], Amy D, NP   1 tablet at 04/27/19 0827  . ondansetron (ZOFRAN) injection 4 mg  4 mg Intravenous Q6H PRN [place], Amy D, NP   4 mg at 04/27/19 00630  . oxyCODONE (Oxy IR/ROXICODONE) immediate release tablet 5 mg  5 mg Oral Q6H PRN Bensimhon, DShaune Pascal MD      . promethazine (PHENERGAN) tablet 12.5 mg  12.5 mg Oral Q6H PRN [place], Amy D, NP      . traMADol (ULTRAM) tablet 50 mg  50 mg Oral Q6H [place], Amy D, NP   50 mg at 04/28/19 0605   Labs: Basic Metabolic Panel: Recent Labs  Lab 04/27/19 0552 04/28/19 0533  NA 133* 131*  K 4.2 4.3  CL 95* 93*  CO2 28 26  GLUCOSE 71 78  BUN 18 31*  CREATININE 5.84* 8.38*  CALCIUM 8.8* 8.8*   CBC: Recent Labs  Lab 04/27/19 0552 04/28/19 0533  WBC 12.2* 13.5*  HGB 10.6* 10.1*  HCT 34.3* 32.7*  MCV 92.5 93.7  PLT 279 300   CBG: Recent Labs  Lab 04/26/19 1624 04/26/19 2107  GLUCAP 99 119*   Studies/Results: Dg Chest Port 1 View  Result Date: 04/26/2019 CLINICAL DATA:  Encounter for central line placement EXAM: PORTABLE CHEST 1 VIEW COMPARISON:  03/27/2019 FINDINGS: Patient has a RIGHT IJ central line, tip to the superior vena cava. A LEFT  IJ dialysis catheter tip overlies the level of the superior vena cava. Patient has a LEFT-sided transvenous pacemaker with leads to the RIGHT atrium and RIGHT ventricle. LEFT ventricular assist device appears unchanged. No pneumothorax. Heart is mildly enlarged and stable in configuration. Stable elevation of the RIGHT hemidiaphragm. Lungs are clear. No edema. IMPRESSION: RIGHT IJ central line tip to the superior vena cava. No pneumothorax. Electronically Signed   By: Nolon Nations M.D.   On: 04/26/2019 20:23   Dialysis Orders:  MWF at [facility] 4:15hr, 400/800, EDW 84kg, 2K/3Ca, TDC, no heparin - Hectoral 19mg IV q HD - Mircera 2058m q 2 weeks (last 8/18) - Venofer 5057mV weekly  Assessment/Plan: 1.  R foot ischemia/dry gangrene: Planned amputation (R TMA v. BKA, L great toe) 8/25 per [facility]. Pain control and heparin bridging per HF team. 2.  ESRD: Continue HD per MWF schedule - next 8/24. 3.  Hypertension/volume: BP controlled, euvolemic at this  time. 4.  Anemia:  Hgb 10.1 - not due for ESA yet. Anticipate post-op drop, transfuse prn. 5.  Metabolic bone disease: Ca OK, Phos pending.  6.  CAD/ICM/LVAD: Per HF team 7.  Type 2 DM with known severe gastroparesis  KatVeneta PentonA-C 04/28/2019, 10:23 AM  [facility] Pager: [phone] seen, examined and agree w A/P as above.  RobKelly SplinterD 04/28/2019, 11:55 AM

## 2019-04-28 NOTE — Progress Notes (Signed)
ANTICOAGULATION CONSULT NOTE - Follow Up Consult  Pharmacy Consult for heparin Indication: LVAD  Allergies  Allergen Reactions  . Metformin And Related Diarrhea    Patient Measurements: Height: 5' 7"  (170.2 cm) Weight: 181 lb 7 oz (82.3 kg) IBW/kg (Calculated) : 66.1 Heparin Dosing Weight: 83.2 kg  Vital Signs: Temp: 98.1 F (36.7 C) (08/23 0500) Temp Source: Oral (08/23 0500) BP: 112/99 (08/23 0500) Pulse Rate: 78 (08/23 0500)  Labs: Recent Labs    04/26/19 2132  04/27/19 0552 04/27/19 0756 04/27/19 1645 04/28/19 0533  HGB  --   --  10.6*  --   --  10.1*  HCT  --   --  34.3*  --   --  32.7*  PLT  --   --  279  --   --  300  LABPROT 18.8*  --  20.7*  --   --  20.6*  INR 1.6*  --  1.8*  --   --  1.8*  HEPARINUNFRC  --    < > 0.25* 0.15* 0.31 0.30  CREATININE  --   --  5.84*  --   --  8.38*   < > = values in this interval not displayed.    Estimated Creatinine Clearance: 10.3 mL/min (A) (by C-G formula based on SCr of 8.38 mg/dL (H)).   Assessment: 42 yom w/ hx Heartmate 3 LVAD, factor V Leiden, and PAF - presenting with severe pain (plan for amputation next week). On warfarin PTA - regimen was 5 mg daily except 7.5 mg on Tuesday; however, INR elevated so plan for 2.5 mg on Tues, 5 mg on Wed and Thursday, hold Friday.   INR on 8/21 is 1.6. Heparin infusion initiated since INR<2.  INR today is 1.8  Heparin level this AM (0.3) is now therapeutic but at the lower end of the goal. Bleeding from RIJ site has now resolved. CBC stable  Goal of Therapy:  Heparin level 0.3-0.7 units/ml INR 2-2.5 Monitor platelets by anticoagulation protocol: Yes   Plan:  Increase heparin infusion rate to 1300 units/hr to prevent level from dropping below goal Check heparin level in 8 hours Daily heparin level and CBC Monitor bleeding  Vertis Kelch, PharmD PGY2 Cardiology Pharmacy Resident Phone [phone] 04/28/2019       7:21 AM  Please check [email or web address] for unit-specific  pharmacist phone numbers

## 2019-04-29 DIAGNOSIS — K3184 Gastroparesis: Secondary | ICD-10-CM

## 2019-04-29 DIAGNOSIS — Z992 Dependence on renal dialysis: Secondary | ICD-10-CM

## 2019-04-29 LAB — PROTIME-INR
INR: 1.6 — ABNORMAL HIGH (ref 0.8–1.2)
Prothrombin Time: 18.6 seconds — ABNORMAL HIGH (ref 11.4–15.2)

## 2019-04-29 LAB — GLUCOSE, CAPILLARY
Glucose-Capillary: 212 mg/dL — ABNORMAL HIGH (ref 70–99)
Glucose-Capillary: 69 mg/dL — ABNORMAL LOW (ref 70–99)
Glucose-Capillary: 103 mg/dL — ABNORMAL HIGH (ref 70–99)
Glucose-Capillary: 151 mg/dL — ABNORMAL HIGH (ref 70–99)
Glucose-Capillary: 185 mg/dL — ABNORMAL HIGH (ref 70–99)
Glucose-Capillary: 73 mg/dL (ref 70–99)
Glucose-Capillary: 105 mg/dL — ABNORMAL HIGH (ref 70–99)
Glucose-Capillary: 113 mg/dL — ABNORMAL HIGH (ref 70–99)
Glucose-Capillary: 135 mg/dL — ABNORMAL HIGH (ref 70–99)
Glucose-Capillary: 104 mg/dL — ABNORMAL HIGH (ref 70–99)

## 2019-04-29 LAB — CBC
HCT: 30 % — ABNORMAL LOW (ref 39.0–52.0)
Hemoglobin: 9.4 g/dL — ABNORMAL LOW (ref 13.0–17.0)
MCH: 28.8 pg (ref 26.0–34.0)
MCHC: 31.3 g/dL (ref 30.0–36.0)
MCV: 92 fL (ref 80.0–100.0)
Platelets: 321 10*3/uL (ref 150–400)
RBC: 3.26 MIL/uL — ABNORMAL LOW (ref 4.22–5.81)
RDW: 15.7 % — ABNORMAL HIGH (ref 11.5–15.5)
WBC: 11.7 10*3/uL — ABNORMAL HIGH (ref 4.0–10.5)
nRBC: 0.3 % — ABNORMAL HIGH (ref 0.0–0.2)

## 2019-04-29 LAB — BASIC METABOLIC PANEL
Anion gap: 18 — ABNORMAL HIGH (ref 5–15)
BUN: 42 mg/dL — ABNORMAL HIGH (ref 6–20)
CO2: 25 mmol/L (ref 22–32)
Calcium: 8.6 mg/dL — ABNORMAL LOW (ref 8.9–10.3)
Chloride: 88 mmol/L — ABNORMAL LOW (ref 98–111)
Creatinine, Ser: 11.1 mg/dL — ABNORMAL HIGH (ref 0.61–1.24)
GFR calc Af Amer: 5 mL/min — ABNORMAL LOW (ref >60)
GFR calc non Af Amer: 5 mL/min — ABNORMAL LOW (ref >60)
Glucose, Bld: 121 mg/dL — ABNORMAL HIGH (ref 70–99)
Potassium: 4.1 mmol/L (ref 3.5–5.1)
Sodium: 131 mmol/L — ABNORMAL LOW (ref 135–145)

## 2019-04-29 LAB — LACTATE DEHYDROGENASE: LDH: 170 U/L (ref 98–192)

## 2019-04-29 LAB — HEPARIN LEVEL (UNFRACTIONATED): Heparin Unfractionated: 0.33 IU/mL (ref 0.30–0.70)

## 2019-04-29 MED ORDER — CEFAZOLIN SODIUM-DEXTROSE 2-4 GM/100ML-% IV SOLN
2.0000 g | INTRAVENOUS | Status: AC
  Administered 2019-04-30: 2 g via INTRAVENOUS
  Filled 2019-04-29: qty 100

## 2019-04-29 MED ORDER — HEPARIN SODIUM (PORCINE) 1000 UNIT/ML IJ SOLN
INTRAMUSCULAR | Status: AC
  Administered 2019-04-29: 20:00:00 1000 [IU]
  Filled 2019-04-29: qty 4

## 2019-04-29 MED ORDER — CHLORHEXIDINE GLUCONATE 4 % EX LIQD
60.0000 mL | Freq: Once | CUTANEOUS | Status: AC
  Administered 2019-04-30: 4 via TOPICAL
  Filled 2019-04-29: qty 60

## 2019-04-29 MED ORDER — PROMETHAZINE HCL 25 MG/ML IJ SOLN
12.5000 mg | Freq: Four times a day (QID) | INTRAMUSCULAR | Status: DC | PRN
  Administered 2019-04-29: 12.5 mg via INTRAVENOUS
  Administered 2019-05-07: 25 mg via INTRAVENOUS
  Administered 2019-05-13: 12.5 mg via INTRAVENOUS
  Filled 2019-04-29 (×3): qty 1

## 2019-04-29 MED ORDER — CHLORHEXIDINE GLUCONATE 4 % EX LIQD
60.0000 mL | Freq: Once | CUTANEOUS | Status: AC
  Administered 2019-04-29: 4 via TOPICAL
  Filled 2019-04-29: qty 60

## 2019-04-29 MED ORDER — SODIUM CHLORIDE 0.9 % IV SOLN
INTRAVENOUS | Status: DC
  Administered 2019-04-30: 09:00:00 via INTRAVENOUS

## 2019-04-29 NOTE — Progress Notes (Signed)
Admit: 04/26/2019 LOS: 3  46M ESRD w/ LVAD and ischemic R foot for amputation  Subjective:  . No interval events   08/23 0701 - 08/24 0700 In: 1455.1 [P.O.:1140; I.V.:315.1] Out: -   Filed Weights   04/27/19 0500 04/28/19 0500 04/29/19 0450  Weight: 87.1 kg 82.3 kg 85 kg    Scheduled Meds: . aspirin EC  81 mg Oral Daily  . atorvastatin  40 mg Oral q1800  . calcitRIOL  0.5 mcg Oral Q M,W,F  . Chlorhexidine Gluconate Cloth  6 each Topical Q0600  . citalopram  20 mg Oral Daily  . erythromycin  250 mg Oral TID  . gabapentin  300 mg Oral BID  . insulin aspart  0-9 Units Subcutaneous TID WC  . insulin aspart  7 Units Subcutaneous Q supper  . insulin glargine  10 Units Subcutaneous QHS  . LORazepam  0.5 mg Oral BID  . mouth rinse  15 mL Mouth Rinse BID  . metoCLOPramide  10 mg Oral TID WC  . midodrine  5 mg Oral Q M,W,F  . multivitamin  1 tablet Oral Daily  . traMADol  50 mg Oral Q6H   Continuous Infusions: . heparin 1,300 Units/hr (04/29/19 0536)   PRN Meds:.acetaminophen, docusate sodium, ondansetron (ZOFRAN) IV, oxyCODONE, promethazine, promethazine  Current Labs: reviewed   Physical Exam:  Blood pressure 115/87, pulse 84, temperature 97.7 F (36.5 C), temperature source Oral, resp. rate 17, height 5' 7"  (1.702 m), weight 85 kg, SpO2 (!) 40 %. NAD COnt hum from LVAD RRR NO LEE Ischemic/drygangrenous changes at distal R foot AAO x3 TDC L IJ  Dialysis Orders:  MWF at [facility] 4:15hr, 400/800, EDW 84kg, 2K/3Ca, TDC, no heparin - Hectoral 36mg IV q HD - Mircera 207m q 2 weeks (last 8/18) - Venofer 506mV weekly  A 1.  R foot ischemia/dry gangrene: Planned amputation (R TMA v. BKA, L great toe) 8/25 per VVS. Pain control and heparin bridging per HF team. 2.  ESRD: Continue HD per MWF schedule today 2K No hep 2L UF as able 3.  Hypertension/volume: BP controlled, euvolemic at this time. 4.  Anemia:  Hgb 9.4 - not due for ESA yet. Anticipate post-op drop, transfuse  prn. 5.  Metabolic bone disease: Ca OK, Phos pending.  6.  CAD/ICM/LVAD: Per HF team 7.  Type 2 DM with known severe gastroparesis  RyaPearson Grippe 04/29/2019, 9:48 AM  Recent Labs  Lab 04/27/19 0552 04/28/19 0533 04/29/19 0531  NA 133* 131* 131*  K 4.2 4.3 4.1  CL 95* 93* 88*  CO2 28 26 25   GLUCOSE 71 78 121*  BUN 18 31* 42*  CREATININE 5.84* 8.38* 11.10*  CALCIUM 8.8* 8.8* 8.6*   Recent Labs  Lab 04/27/19 0552 04/28/19 0533 04/29/19 0531  WBC 12.2* 13.5* 11.7*  HGB 10.6* 10.1* 9.4*  HCT 34.3* 32.7* 30.0*  MCV 92.5 93.7 92.0  PLT 279 300 321

## 2019-04-29 NOTE — Progress Notes (Signed)
Patient ID: [name], male   DOB: May 24, 1966, 53 y.o.   MRN: [id number]   Advanced Heart Failure VAD Team Note  PCP-Cardiologist: No primary care provider on file.   Subjective:    Admitted for ischemic foot pain with need for amputation (transmetatarsal vs BKA) on Tuesday.   Nausea with episode of emesis.   INR 1.6 on heparin. No longer bleeding from central line site.   LVAD INTERROGATION:  HeartMate 3 LVAD:   Flow 3.7L  liters/min, speed 5500, power 4.4, PI 5.3.  2 PI events.     Objective:    Vital Signs:   Temp:  [97.5 F (36.4 C)-98 F (36.7 C)] 98 F (36.7 C) (08/24 0450) Pulse Rate:  [84] 84 (08/24 0000) Resp:  [13-15] 13 (08/24 0450) BP: (112-126)/(78-106) 125/100 (08/24 0450) SpO2:  [40 %-98 %] 40 % (08/24 0000) Weight:  [85 kg] 85 kg (08/24 0450) Last BM Date: 04/25/19 Mean arterial Pressure 90s-100s  Intake/Output:   Intake/Output Summary (Last 24 hours) at 04/29/2019 0655 Last data filed at 04/29/2019 0536 Gross per 24 hour  Intake 1455.06 ml  Output -  Net 1455.06 ml     Physical Exam    General: Well appearing this am. NAD.  HEENT: Normal. Neck: Supple, JVP 7-8 cm. Carotids OK.  Cardiac:  Mechanical heart sounds with LVAD hum present.  Lungs:  CTAB, normal effort.  Abdomen:  NT, ND, no HSM. No bruits or masses. +BS  LVAD exit site: Well-healed and incorporated. Dressing dry and intact. No erythema or drainage. Stabilization device present and accurately applied. Driveline dressing changed daily per sterile technique. Extremities:  Gangrene right foot, s/p right 4th toe amputation.  Neuro:  Alert & oriented x 3. Cranial nerves grossly intact. Moves all 4 extremities w/o difficulty. Affect pleasant     Telemetry   Sinus 90s Personally reviewed   Labs   Basic Metabolic Panel: Recent Labs  Lab 04/27/19 0552 04/28/19 0533 04/29/19 0531  NA 133* 131* 131*  K 4.2 4.3 4.1  CL 95* 93* 88*  CO2 28 26 25   GLUCOSE 71 78 121*  BUN 18  31* 42*  CREATININE 5.84* 8.38* 11.10*  CALCIUM 8.8* 8.8* 8.6*    Liver Function Tests: No results for input(s): AST, ALT, ALKPHOS, BILITOT, PROT, ALBUMIN in the last 168 hours. No results for input(s): LIPASE, AMYLASE in the last 168 hours. No results for input(s): AMMONIA in the last 168 hours.  CBC: Recent Labs  Lab 04/27/19 0552 04/28/19 0533 04/29/19 0531  WBC 12.2* 13.5* 11.7*  HGB 10.6* 10.1* 9.4*  HCT 34.3* 32.7* 30.0*  MCV 92.5 93.7 92.0  PLT 279 300 321    INR: Recent Labs  Lab 04/26/19 2132 04/27/19 0552 04/28/19 0533 04/29/19 0531  INR 1.6* 1.8* 1.8* 1.6*    Other results:    Imaging   No results found.   Medications:     Scheduled Medications: . aspirin EC  81 mg Oral Daily  . atorvastatin  40 mg Oral q1800  . calcitRIOL  0.5 mcg Oral Q M,W,F  . Chlorhexidine Gluconate Cloth  6 each Topical Q0600  . citalopram  20 mg Oral Daily  . erythromycin  250 mg Oral TID  . gabapentin  300 mg Oral BID  . insulin aspart  0-9 Units Subcutaneous TID WC  . insulin aspart  7 Units Subcutaneous Q supper  . insulin glargine  10 Units Subcutaneous QHS  . LORazepam  0.5 mg Oral BID  .  mouth rinse  15 mL Mouth Rinse BID  . metoCLOPramide  10 mg Oral TID WC  . midodrine  5 mg Oral Q M,W,F  . multivitamin  1 tablet Oral Daily  . traMADol  50 mg Oral Q6H    Infusions: . heparin 1,300 Units/hr (04/29/19 0536)    PRN Medications: acetaminophen, docusate sodium, ondansetron (ZOFRAN) IV, oxyCODONE, promethazine  Assessment/Plan:    1. R Ischemic Toes: Plan for amputation of RLE on Tuesday (R transmetatarsal versus BKA). I suspect he will need BKA given level of gangrene to mid foot.  WBCs 11.  He completed vancomycin for ?osteomyelitis on 8/19 (was getting with HD). ESR 79 but afebrile with cultures NGTD.  - Dr. Donzetta Matters following for vascular.  - Off coumadin. Heparin started with INR 1.6 today.  - Continue oxycodone and ultram for pain. Watch for  oversedation (seems ok this morning).  - With night sweats, diaphoresis, low-grade temps and leukocytosis I worry about infection - Low threshold for abx  2. Chronic systolic CHF: Ischemic cardiomyopathy. St Jude ICD. NSTEMI in March 2018 with DES to LAD and RCA, complicated by cardiogenic shock, low output requiring milrinone. Echo 8/18 with EF 20-25%, moderate MR. CPX (8/18) with severe functional impairment due to HF. He is s/p HM3 LVAD placement. He had pump thrombosis 6/19 due to [facility] outflow graft kinking. He had pump exchange for another HM3. INR1.6today.  - Volume managed at dialysis, needs today.  - Continue ASA 81and warfarin with INR goal 2-2.5 (warfarin on hold and on heparin pre-surgery). - He has been referred [facility] for evaluation for heart/kidney transplantand recently saw Dr Mosetta Pigeon =>he is not candidate for transplantnowgiven extensive PAD.  3. ESRD: HD on M-W-F with midodrine on HD days. - HD today.  4.  CAD: NSTEMI in 3/18. LHC with 99% ulcerated lesion proximal RCA with left to right collaterals, 95% mid LAD stenosis after mid LAD stent. s/p PCI to RCA and LAD on 11/25/16. No chest pain.  - Continue statin and ASA. 5. h/o DVTs: Factor V Leiden heterozygote. -Anticoagulated. 6. Diabetic gastroparesis: He has seen gastroparesis specialist at [facility]. Surprisingly, gastric emptying study was normal. However, electrogastrogram showed poor gastric accomodation/capacity. Therefore, small frequent meals recommended.More nauseated this morning.  - Continue reglan 10 mg tid/ac and Marinol.  - Continue Protonix bid.  - He will continue erythromycinindefinitely.  - Limit narcotic use as much as possible.  - Will make phenergan IV this morning.  6. DM:SSI and home insulin regimen.  7. HTN:MAP elevated today but going for HD.   I reviewed the LVAD parameters from today, and compared the results to the patient's prior recorded data.  No programming changes  were made.  The LVAD is functioning within specified parameters.  The patient performs LVAD self-test daily.  LVAD interrogation was negative for any significant power changes, alarms or PI events/speed drops.  LVAD equipment check completed and is in good working order.  Back-up equipment present.   LVAD education done on emergency procedures and precautions and reviewed exit site care.  Length of Stay: 3  Loralie Champagne, MD 04/29/2019, 6:55 AM  VAD Team --- VAD ISSUES ONLY--- Pager [phone] (7am - 7am)  Advanced Heart Failure Team  Pager [phone] (M-F; 7a - 4p)  Please contact [facility] for night-coverage after hours (4p -7a ) and weekends on [email or web address]

## 2019-04-29 NOTE — Consult Note (Signed)
Hospital Consult    Reason for Consult:  Right foot wound Referring Physician:  Dr. Aundra Dubin MRN #:  [id number]  History of Present Illness: This is a 53 y.o. male history of end-stage renal disease and ventricular assist device.  Patient recently underwent amputation on the right fourth toe which did initially heal.  Significant pain in the foot.  He is undergone right SFA stenting as well as angioplasty of his posterior tibial artery.  Past Medical History:  Diagnosis Date   Angina    ASCVD (arteriosclerotic cardiovascular disease)    , Anterior infarction 2005, LAD diagonal bifurcation intervention 03/2004   Automatic implantable cardiac defibrillator -St. Jude's        Benign neoplasm of colon    CHF (congestive heart failure) (HCC)    Chronic systolic heart failure (HCC)    Coronary artery disease     Widely patent previously placed stents in the left anterior    Crohn's disease ([place])    Deep venous thrombosis (HCC)    Recurrent-on Coumadin   Dialysis patient ([place])    Dyspnea    Gastroparesis    GERD (gastroesophageal reflux disease)    High cholesterol    Hyperlipidemia    Hypersomnolent    Previous diagnosis of narcolepsy   Hypertension, essential    Ischemic cardiomyopathy    Ejection fraction 15-20% catheterization 2010   Type II or unspecified type diabetes mellitus without mention of complication, not stated as uncontrolled    Unspecified gastritis and gastroduodenitis without mention of hemorrhage     Past Surgical History:  Procedure Laterality Date   A/V SHUNTOGRAM N/A 05/24/2018   Procedure: A/V SHUNTOGRAM;  Surgeon: Marty Heck, MD;  Location: [facility];  Service: Cardiovascular;  Laterality: N/A;   ABDOMINAL AORTOGRAM W/LOWER EXTREMITY N/A 03/18/2019   Procedure: ABDOMINAL AORTOGRAM W/LOWER EXTREMITY;  Surgeon: Waynetta Sandy, MD;  Location: [place] [facility];  Service: Cardiovascular;  Laterality: N/A;    AMPUTATION Right 03/21/2019   Procedure: AMPUTATION DIGIT RIGHT FOURTH TOE;  Surgeon: Waynetta Sandy, MD;  Location: [place];  Service: Vascular;  Laterality: Right;   APPENDECTOMY     AV FISTULA PLACEMENT Right 06/26/2017   Procedure: ARTERIOVENOUS (AV) FISTULA CREATION VERSUS GRAFT RIGHT  ARM;  Surgeon: Conrad Mizpah, MD;  Location: [place];  Service: Vascular;  Laterality: Right;   AV FISTULA PLACEMENT Right 10/31/2017   Procedure: INSERTION OF ARTERIOVENOUS (AV) ARTEGRAFT,  RIGHT UPPER ARM;  Surgeon: Conrad Crab Orchard, MD;  Location: [place];  Service: Vascular;  Laterality: Right;   AV FISTULA PLACEMENT Left 05/29/2018   Procedure: ARTERIOVENOUS (AV) FISTULA CREATION  LEFT UPPER EXTREMITY;  Surgeon: Waynetta Sandy, MD;  Location: [place];  Service: Vascular;  Laterality: Left;   AV FISTULA PLACEMENT Left 08/09/2018   Procedure: INSERTION OF ARTERIOVENOUS (AV) GORE-TEX GRAFT LEFT UPPER ARM;  Surgeon: Waynetta Sandy, MD;  Location: [place];  Service: Vascular;  Laterality: Left;   CARDIAC DEFIBRILLATOR PLACEMENT  2010   St. Jude ICD   CENTRAL LINE INSERTION Right 05/24/2018   Procedure: CENTRAL LINE INSERTION;  Surgeon: Marty Heck, MD;  Location: [place] [facility];  Service: Cardiovascular;  Laterality: Right;   COLECTOMY  ~ 2003   "for Crohn's" ascending colon.     CORONARY STENT INTERVENTION N/A 11/25/2016   Procedure: Coronary Stent Intervention;  Surgeon: Leonie Man, MD;  Location: [place] [facility];  Service: Cardiovascular;  Laterality: N/A;  EP IMPLANTABLE DEVICE N/A 09/14/2016   Procedure: ICD Generator Changeout;  Surgeon: Deboraha Sprang, MD;  Location: [place] [facility];  Service: Cardiovascular;  Laterality: N/A;   ESOPHAGOGASTRODUODENOSCOPY N/A 07/12/2017   Procedure: ESOPHAGOGASTRODUODENOSCOPY (EGD);  Surgeon: Jerene Bears, MD;  Location[facility];  Service: Gastroenterology;  Laterality: N/A;  VAD pt so VAD team will need to  accompany pt.    FETAL SURGERY FOR CONGENITAL HERNIA     ????? pt has no knowledge of this.[name]     FISTULOGRAM Left 08/09/2018   Procedure: FISTULOGRAM LEFT ARM;  Surgeon: Waynetta Sandy, MD;  Location: [place];  Service: Vascular;  Laterality: Left;   INGUINAL HERNIA REPAIR     INSERTION OF DIALYSIS CATHETER Right 06/26/2017   Procedure: INSERTION OF TUNNELED  DIALYSIS CATHETER RIGHT INTERNAL JUGULAR;  Surgeon: Conrad Sequim, MD;  Location: [place];  Service: Vascular;  Laterality: Right;   INSERTION OF IMPLANTABLE LEFT VENTRICULAR ASSIST DEVICE N/A 06/12/2017   Procedure: INSERTION OF IMPLANTABLE LEFT VENTRICULAR ASSIST [place] 3;  Surgeon: Ivin Poot, MD;  Location: [place];  Service: Open Heart Surgery;  Laterality: N/A;  HM3 LVAD  CIRC ARREST  NITRIC OXIDE   INSERTION OF IMPLANTABLE LEFT VENTRICULAR ASSIST DEVICE N/A 02/28/2018   Procedure: REDO INSERTION OF IMPLANTABLE LEFT VENTRICULAR ASSIST DEVICE - HEARTMATE 3;  Surgeon: Ivin Poot, MD;  Location: [place];  Service: Open Heart Surgery;  Laterality: N/A;   IR REMOVAL TUN CV CATH W/O FL  02/26/2018   IR THORACENTESIS ASP PLEURAL SPACE W/IMG GUIDE  06/28/2017   LIGATION OF ARTERIOVENOUS  FISTULA Right 10/31/2017   Procedure: LIGATION OF BRACHIO-BASILIC VEIN TRANSPOSITION RIGHT ARM;  Surgeon: Conrad , MD;  Location: [place];  Service: Vascular;  Laterality: Right;   MULTIPLE EXTRACTIONS WITH ALVEOLOPLASTY N/A 06/08/2017   Procedure: MULTIPLE EXTRACTION WITH ALVEOLOPLASTY AND PRE PROSTHETIC SURGERY AS NEEDED;  Surgeon: Lenn Cal, DDS;  Location: [place];  Service: Oral Surgery;  Laterality: N/A;   PERIPHERAL VASCULAR BALLOON ANGIOPLASTY  03/18/2019   Procedure: PERIPHERAL VASCULAR BALLOON ANGIOPLASTY;  Surgeon: Waynetta Sandy, MD;  Location: [place] [facility];  Service: Cardiovascular;;  RT PT   PERIPHERAL VASCULAR INTERVENTION  03/18/2019   Procedure: PERIPHERAL VASCULAR INTERVENTION;   Surgeon: Waynetta Sandy, MD;  Location: [place] [facility];  Service: Cardiovascular;;  RT SFA   RIGHT HEART CATH N/A 06/07/2017   Procedure: RIGHT HEART CATH;  Surgeon: Larey Dresser, MD;  Location: [place] [facility];  Service: Cardiovascular;  Laterality: N/A;   RIGHT HEART CATH N/A 02/08/2018   Procedure: RIGHT HEART CATH;  Surgeon: Larey Dresser, MD;  Location: [place] [facility];  Service: Cardiovascular;  Laterality: N/A;   RIGHT HEART CATH N/A 08/09/2018   Procedure: RIGHT HEART CATH;  Surgeon: Larey Dresser, MD;  Location: [place] [facility];  Service: Cardiovascular;  Laterality: N/A;   RIGHT/LEFT HEART CATH AND CORONARY ANGIOGRAPHY N/A 11/23/2016   Procedure: Right/Left Heart Cath and Coronary Angiography;  Surgeon: Larey Dresser, MD;  Location: [place] [facility];  Service: Cardiovascular;  Laterality: N/A;   TEE WITHOUT CARDIOVERSION N/A 06/12/2017   Procedure: TRANSESOPHAGEAL ECHOCARDIOGRAM (TEE);  Surgeon: Prescott Gum, Collier Salina, MD;  Location: [place];  Service: Open Heart Surgery;  Laterality: N/A;   TEE WITHOUT CARDIOVERSION N/A 02/28/2018   Procedure: TRANSESOPHAGEAL ECHOCARDIOGRAM (TEE);  Surgeon: Prescott Gum, Collier Salina, MD;  Location: [place];  Service: Open Heart Surgery;  Laterality: N/A;   TEMPORARY DIALYSIS CATHETER Left 05/24/2018   Procedure: TEMPORARY DIALYSIS CATHETER;  Surgeon: Marty Heck, MD;  Location: [place] [facility];  Service: Cardiovascular;  Laterality: Left;   THROMBECTOMY AND REVISION OF ARTERIOVENTOUS (AV) GORETEX  GRAFT Right 12/12/2017   Procedure: THROMBECTOMY ARTERIOVENTOUS (AV) GORETEX  GRAFT RIGHT UPPER ARM;  Surgeon: Conrad Pastos, MD;  Location: [place];  Service: Vascular;  Laterality: Right;    Allergies  Allergen Reactions   Metformin And Related Diarrhea    Prior to Admission medications   Medication Sig Start Date End Date Taking? Authorizing Provider  acetaminophen (TYLENOL) 325 MG tablet Take 2 tablets (650 mg total) by  mouth every 4 (four) hours as needed for headache or mild pain. 03/22/19  Yes Kroeger, Lorelee Cover., PA-C  aspirin EC 81 MG tablet Take 1 tablet (81 mg total) by mouth daily. 08/20/18  Yes Eileen Stanford, PA-C  atorvastatin (LIPITOR) 40 MG tablet Take 1 tablet (40 mg total) by mouth daily at 6 PM. 01/25/18  Yes [name], MD  calcitRIOL (ROCALTROL) 0.5 MCG capsule Take 1 capsule (0.5 mcg total) by mouth every Monday, Wednesday, and Friday. 06/06/18  Yes Georgiana Shore, NP  citalopram (CELEXA) 20 MG tablet Take 1 tablet (20 mg total) by mouth daily. 03/19/18  Yes Georgiana Shore, NP  diclofenac sodium (VOLTAREN) 1 % GEL Apply 4 g topically 4 (four) times daily. Patient taking differently: Apply 4 g topically 4 (four) times daily as needed (pain).  04/11/19  Yes Anderson, Chelsey L, DO  docusate sodium (COLACE) 100 MG capsule Take 1 capsule (100 mg total) by mouth daily as needed for mild constipation. 03/22/19  Yes Kroeger, Daleen Snook M., PA-C  dronabinol (MARINOL) 2.5 MG capsule Take 1 capsule (2.5 mg total) by mouth 2 (two) times daily before lunch and supper. 03/27/18  Yes Larey Dresser, MD  erythromycin (E-MYCIN) 250 MG tablet Take 1 tablet (250 mg total) by mouth 3 (three) times daily. 03/25/18  Yes Isaiah Serge, NP  gabapentin (NEURONTIN) 300 MG capsule Take 1 capsule (300 mg total) by mouth 2 (two) times daily. 04/16/19  Yes Larey Dresser, MD  insulin glargine (LANTUS) 100 UNIT/ML injection Inject 0.1 mLs (10 Units total) into the skin daily. Patient taking differently: Inject 10 Units into the skin at bedtime.  05/09/18  Yes Lovenia Kim, MD  LORazepam (ATIVAN) 0.5 MG tablet Take 1 tablet (0.5 mg total) by mouth every 12 (twelve) hours as needed (nausea). 03/27/18  Yes Larey Dresser, MD  metoCLOPramide (REGLAN) 10 MG tablet Take 1 tablet (10 mg total) by mouth 3 (three) times daily. Patient taking differently: Take 10 mg by mouth 3 (three) times daily with meals.  01/22/18  Yes  Georgiana Shore, NP  midodrine (PROAMATINE) 5 MG tablet Take 1 tablet (5 mg total) by mouth daily. Take 1 tablet before dialysis treatment Patient taking differently: Take 5 mg by mouth every Monday, Wednesday, and Friday. Take 1 tablet before dialysis treatment 04/04/19  Yes Clegg, Amy D, NP  multivitamin (RENA-VIT) TABS tablet Take 1 tablet by mouth daily.   Yes [provider]  NOVOLOG 100 UNIT/ML injection INJECT 7 UNITS INTO THE SKIN DAILY WITH SUPPER. Patient taking differently: Inject 7 Units into the skin daily with supper.  03/15/19  Yes Anderson, Chelsey L, DO  oxyCODONE (ROXICODONE) 5 MG immediate release tablet Take 1-2 tablets every 4-6 hours for severe pain. Patient taking differently: Take  5 mg by mouth every 4 (four) hours as needed for severe pain.  03/22/19  Yes Kroeger, Daleen Snook M., PA-C  promethazine (PHENERGAN) 12.5 MG tablet Take 1 tablet (12.5 mg total) by mouth every 6 (six) hours as needed for nausea or vomiting. 10/23/18  Yes Larey Dresser, MD  traMADol (ULTRAM) 50 MG tablet Take 1 tablet (50 mg total) by mouth every 6 (six) hours as needed for moderate pain. 08/18/18  Yes Eileen Stanford, PA-C  warfarin (COUMADIN) 5 MG tablet TAKE AS FOLLOWS: MON-WED-FRI 1&1/2 TABLETS DAILY. ON ALL OTHER DAYS 1 TABLET Patient taking differently: Take 5-7.5 mg by mouth one time only at 6 PM. 7.5 mg  Take Tuesday and Thursday 5 mg all the other days 09/12/18  Yes Larey Dresser, MD    Social History   Socioeconomic History   Marital status: Married    Spouse name: Not on file   Number of children: Not on file   Years of education: Not on file   Highest education level: Not on file  Occupational History   Not on file  Social Needs   Financial resource strain: Not on file   Food insecurity    Worry: Not on file    Inability: Not on file   Transportation needs    Medical: Not on file    Non-medical: Not on file  Tobacco Use   Smoking status: Never Smoker    Smokeless tobacco: Never Used  Substance and Sexual Activity   Alcohol use: No   Drug use: No   Sexual activity: Never  Lifestyle   Physical activity    Days per week: Not on file    Minutes per session: Not on file   Stress: Not on file  Relationships   Social connections    Talks on phone: Not on file    Gets together: Not on file    Attends religious service: Not on file    Active member of club or organization: Not on file    Attends meetings of clubs or organizations: Not on file    Relationship status: Not on file   Intimate partner violence    Fear of current or ex partner: Not on file    Emotionally abused: Not on file    Physically abused: Not on file    Forced sexual activity: Not on file  Other Topics Concern   Not on file  Social History Narrative   Not on file    Family History  Problem Relation Age of Onset   Colon polyps Mother    Diabetes Mother    Heart attack Father    Diabetes Father    Stroke Paternal Grandfather    Colitis Sister    Heart disease Brother    Colitis Sister    Colon cancer Neg Hx     ROS: right foot pain  Physical Examination  Vitals:   04/29/19 1040 04/29/19 1304  BP: (!) 112/94 (!) 123/105  Pulse: 95   Resp: 12   Temp: 98.7 F (37.1 C)   SpO2: 93%    Body mass index is 29.35 kg/m.  General: No acute distress Pulmonary: normal non-labored breathing Cardiac: Strong posterior tibial signal on the right Abdomen: soft, NT/ND, no masses Extremities: Dark discoloration onto the dorsum of the foot plantar aspect appears healthy Neurologic: A&O X 3; Appropriate Affect ; SENSATION: normal; MOTOR FUNCTION:  moving all extremities equally. Speech is fluent/normal   CBC    Component  Value Date/Time   WBC 11.7 (H) 04/29/2019 0531   RBC 3.26 (L) 04/29/2019 0531   HGB 9.4 (L) 04/29/2019 0531   HGB 11.7 (L) 09/07/2016 1457   HCT 30.0 (L) 04/29/2019 0531   HCT 37.5 09/07/2016 1457   PLT 321 04/29/2019  0531   PLT 279 09/07/2016 1457   MCV 92.0 04/29/2019 0531   MCV 81 09/07/2016 1457   MCH 28.8 04/29/2019 0531   MCHC 31.3 04/29/2019 0531   RDW 15.7 (H) 04/29/2019 0531   RDW 14.9 09/07/2016 1457   LYMPHSABS 1.2 04/01/2019 1824   LYMPHSABS 1.3 09/07/2016 1457   MONOABS 1.3 (H) 04/01/2019 1824   EOSABS 0.4 04/01/2019 1824   EOSABS 0.2 09/07/2016 1457   BASOSABS 0.0 04/01/2019 1824   BASOSABS 0.0 09/07/2016 1457    BMET    Component Value Date/Time   NA 131 (L) 04/29/2019 0531   NA 144 05/11/2017 0938   K 4.1 04/29/2019 0531   CL 88 (L) 04/29/2019 0531   CO2 25 04/29/2019 0531   GLUCOSE 121 (H) 04/29/2019 0531   BUN 42 (H) 04/29/2019 0531   BUN 68 (H) 05/11/2017 0938   CREATININE 11.10 (H) 04/29/2019 0531   CREATININE 1.63 (H) 11/09/2016 1505   CALCIUM 8.6 (L) 04/29/2019 0531   CALCIUM 8.7 06/27/2017 1152   GFRNONAA 5 (L) 04/29/2019 0531   GFRNONAA 48 (L) 11/09/2016 1505   GFRAA 5 (L) 04/29/2019 0531   GFRAA 55 (L) 11/09/2016 1505    COAGS: Lab Results  Component Value Date   INR 1.6 (H) 04/29/2019   INR 1.8 (H) 04/28/2019   INR 1.8 (H) 04/27/2019      ASSESSMENT/PLAN: This is a 53 y.o. male has had a right fourth toe amputation after right lower extremity revascularization.  Appears to have good signal of the PT at the ankle.  We will attempt right transmetatarsal amputation possible below-knee amputation tomorrow in the operating room.  Patient be n.p.o. past midnight.  Prior to left great toe amputation would probably proceed with left lower extremity angiography and possible stenting of the left SFA.  I discussed with the patient he demonstrates good understanding  Ysela Hettinger C. Donzetta Matters, MD [facility] Office: [phone] Pager: [phone]

## 2019-04-29 NOTE — Progress Notes (Addendum)
ANTICOAGULATION CONSULT NOTE - Follow Up Consult  Pharmacy Consult for heparin Indication: LVAD  Allergies  Allergen Reactions  . Metformin And Related Diarrhea    Patient Measurements: Height: 5' 7"  (170.2 cm) Weight: 187 lb 6.3 oz (85 kg) IBW/kg (Calculated) : 66.1 Heparin Dosing Weight: 83.2 kg  Vital Signs: Temp: 97.7 F (36.5 C) (08/24 0741) Temp Source: Oral (08/24 0741) BP: 115/87 (08/24 0741) Pulse Rate: 84 (08/24 0000)  Labs: Recent Labs    04/27/19 0552  04/28/19 0533 04/28/19 1657 04/29/19 0531 04/29/19 0532  HGB 10.6*  --  10.1*  --  9.4*  --   HCT 34.3*  --  32.7*  --  30.0*  --   PLT 279  --  300  --  321  --   LABPROT 20.7*  --  20.6*  --  18.6*  --   INR 1.8*  --  1.8*  --  1.6*  --   HEPARINUNFRC 0.25*   < > 0.30 0.41  --  0.33  CREATININE 5.84*  --  8.38*  --  11.10*  --    < > = values in this interval not displayed.    Estimated Creatinine Clearance: 7.9 mL/min (A) (by C-G formula based on SCr of 11.1 mg/dL (H)).   Assessment: 33 yom w/ hx Heartmate 3 LVAD, factor V Leiden, and PAF - presenting with severe pain (plan for amputation this week). On warfarin PTA - regimen was 5 mg daily except 7.5 mg on Tuesday; however, INR elevated so plan for 2.5 mg on Tues, 5 mg on Wed and Thursday, hold Friday.   INR today is 1.6,  Warfarin is on hold due to plan for amputation of RLE.   Heparin level 0.33, remains therapeutic on heparin drip rate 1300 units/hr. Hgb 9.4, plt WNL. LDH 170s - stable. No bleeding reported- central line site improved.   Goal of Therapy:  Heparin level 0.3-0.7 units/ml INR 2-2.5 Monitor platelets by anticoagulation protocol: Yes   Plan:  Continue heparin drip at 1300 units/hr Daily heparin level and CBC Monitor bleeding  Antonietta Jewel, PharmD, BCCCP Clinical Pharmacist  Phone: [phone]  Please check AMION for all [place] phone numbers After 10:00 PM, call [place] [phone] 04/29/2019       9:21 AM  Please  check [email or web address] for unit-specific pharmacist phone numbers

## 2019-04-29 NOTE — Progress Notes (Signed)
Weekly driveline dsg changed using sterile technique. New anchor applied. No needs at this time.

## 2019-04-29 NOTE — Progress Notes (Signed)
Patient currently in HD.

## 2019-04-29 NOTE — Progress Notes (Signed)
LVAD Coordinator Rounding Note:  Pt admitted per Dr. Haroldine Laws 04/25/19 for severe right foot pain. Plan for surgical intervention with Dr Donzetta Matters 04/30/19.   HM III LVAD implanted on 06/12/17 by Dr. Prescott Gum under Destination Therapy criteria due to renal insufficiency. Pump exchange on 03/01/18 for outflow graft twist/obstruction.   Patient laying in bed asleep this morning. Plan for OR tomorrow with Dr Donzetta Matters.   Vital signs: Temp:  98.7 HR:  91 Doppler Pressure: 106 Auto BP: 112/94 (101) O2 Sat: 93% on RA Wt: 187.3> lbs  LVAD interrogation reveals:  Speed: 5500 Flow: 3.8 Power: 4.5w PI: 5.0 Alarms: none Events: 5 PI   Hematocrit: 26  Fixed speed: 5500 Low speed limit: 5200  Drive Line:  CDI., anchor intact and accurately applied. Weekly dressing kits. Daughter changes dressing at home.  Labs: LDH trend: 170>  INR trend: 1.6>  WBC trend: 11.7>  Infection:  Blood cx 08/22>> no growth   Anticoagulation Plan: - INR Goal: 2.0 - 3.0 - ASA Dose: 81 mg daily   Device: -St Jude dual  -Therapies: on 200 bpm - Monitor: on VT 150 bpm  Adverse Events on VAD: - ESRD post LVAD; started CVVH 06/15/17; HD started 06/20/17 - Hospitalization 11/5 - 07/16/17 > gastroparesis diagnosis after EGD - Hospitalization 11/26 - 08/04/17 > gastroparesis - Hospitalization 1/8 - 09/16/17>gastroparesis - referred to Dr. Corliss Parish at [facility] - 10/31/17>rightupper arm arteriovenous graft with Artegraft; ligation of right first stage brachial vein transposition  - 12/11/13 elective admission for thrombectomy for AV graft in RUA  - 12/20/17 > intractable N/V - 01/19/18 > intractable N/V sent by EMS from dialysis center - 03/01/18 > outflow graft twist/occulsion requiring pump exchange - 05/23/18 > admitted for non-working fistula in RUA - 06/10/18 > admitted for intractable N/V - 08/08/18 > admitted for peri-op management for 2nd stage of AV fistula, found to be sclerotic. New LUE graft created from brachial  artery to axillary vein. Developed spontaneous LUE hematoma.  - 08/22/18 > admitted for intractable N/V - 08/28/18> admitted for intractable N/V/D and low INR - Admitted 7/10-7/17> admitted for right foot ischemia- 4th toe amputation per Dr Donzetta Matters  - Admitted 03/26/29> admitted for N/V severe foot pain  - Admitted 04/25/19> admitted for right foot amputation vs BKA   Plan/Recommendations:  1. Please page VAD coordinator with any equipment questions or concerns. 2. VAD coordinator will accompany patient to OR.    [name] RN [place] Coordinator  Office: [phone]  24/7 Pager: [phone]

## 2019-04-30 ENCOUNTER — Inpatient Hospital Stay (HOSPITAL_COMMUNITY): Payer: BC Managed Care – PPO | Admitting: Certified Registered Nurse Anesthetist

## 2019-04-30 ENCOUNTER — Encounter (HOSPITAL_COMMUNITY): Payer: Self-pay | Admitting: Orthopedic Surgery

## 2019-04-30 ENCOUNTER — Encounter (HOSPITAL_COMMUNITY)
Admission: RE | Disposition: A | Discharge status: Home Health | Payer: Self-pay | Source: Home / Self Care | Attending: Cardiology

## 2019-04-30 DIAGNOSIS — I96 Gangrene, not elsewhere classified: Secondary | ICD-10-CM

## 2019-04-30 HISTORY — PX: TRANSMETATARSAL AMPUTATION: SHX6197

## 2019-04-30 LAB — CBC
HCT: 30.3 % — ABNORMAL LOW (ref 39.0–52.0)
Hemoglobin: 9.6 g/dL — ABNORMAL LOW (ref 13.0–17.0)
MCH: 29.3 pg (ref 26.0–34.0)
MCHC: 31.7 g/dL (ref 30.0–36.0)
MCV: 92.4 fL (ref 80.0–100.0)
Platelets: 297 10*3/uL (ref 150–400)
RBC: 3.28 MIL/uL — ABNORMAL LOW (ref 4.22–5.81)
RDW: 15.8 % — ABNORMAL HIGH (ref 11.5–15.5)
WBC: 14.7 10*3/uL — ABNORMAL HIGH (ref 4.0–10.5)
nRBC: 0.5 % — ABNORMAL HIGH (ref 0.0–0.2)

## 2019-04-30 LAB — COMPREHENSIVE METABOLIC PANEL
ALT: 12 U/L (ref 0–44)
AST: 15 U/L (ref 15–41)
Albumin: 2.6 g/dL — ABNORMAL LOW (ref 3.5–5.0)
Alkaline Phosphatase: 86 U/L (ref 38–126)
Anion gap: 10 (ref 5–15)
BUN: 24 mg/dL — ABNORMAL HIGH (ref 6–20)
CO2: 27 mmol/L (ref 22–32)
Calcium: 9.3 mg/dL (ref 8.9–10.3)
Chloride: 96 mmol/L — ABNORMAL LOW (ref 98–111)
Creatinine, Ser: 7.02 mg/dL — ABNORMAL HIGH (ref 0.61–1.24)
GFR calc Af Amer: 9 mL/min — ABNORMAL LOW (ref >60)
GFR calc non Af Amer: 8 mL/min — ABNORMAL LOW (ref >60)
Glucose, Bld: 95 mg/dL (ref 70–99)
Potassium: 3.9 mmol/L (ref 3.5–5.1)
Sodium: 133 mmol/L — ABNORMAL LOW (ref 135–145)
Total Bilirubin: 0.6 mg/dL (ref 0.3–1.2)
Total Protein: 6.4 g/dL — ABNORMAL LOW (ref 6.5–8.1)

## 2019-04-30 LAB — GLUCOSE, CAPILLARY
Glucose-Capillary: 102 mg/dL — ABNORMAL HIGH (ref 70–99)
Glucose-Capillary: 170 mg/dL — ABNORMAL HIGH (ref 70–99)
Glucose-Capillary: 79 mg/dL (ref 70–99)
Glucose-Capillary: 73 mg/dL (ref 70–99)
Glucose-Capillary: 80 mg/dL (ref 70–99)
Glucose-Capillary: 233 mg/dL — ABNORMAL HIGH (ref 70–99)
Glucose-Capillary: 229 mg/dL — ABNORMAL HIGH (ref 70–99)

## 2019-04-30 LAB — LACTATE DEHYDROGENASE: LDH: 170 U/L (ref 98–192)

## 2019-04-30 LAB — PROTIME-INR
INR: 1.4 — ABNORMAL HIGH (ref 0.8–1.2)
Prothrombin Time: 17.3 seconds — ABNORMAL HIGH (ref 11.4–15.2)

## 2019-04-30 LAB — HEPARIN LEVEL (UNFRACTIONATED)
Heparin Unfractionated: 0.18 IU/mL — ABNORMAL LOW (ref 0.30–0.70)
Heparin Unfractionated: 0.29 IU/mL — ABNORMAL LOW (ref 0.30–0.70)

## 2019-04-30 SURGERY — AMPUTATION, FOOT, TRANSMETATARSAL
Anesthesia: Monitor Anesthesia Care | Site: Foot | Laterality: Right

## 2019-04-30 MED ORDER — ONDANSETRON HCL 4 MG/2ML IJ SOLN
INTRAMUSCULAR | Status: DC | PRN
  Administered 2019-04-30: 4 mg via INTRAVENOUS

## 2019-04-30 MED ORDER — RENA-VITE PO TABS
1.0000 | ORAL_TABLET | Freq: Every day | ORAL | Status: DC
  Administered 2019-04-30 – 2019-05-12 (×13): 1 via ORAL
  Filled 2019-04-30 (×14): qty 1

## 2019-04-30 MED ORDER — LIDOCAINE HCL (PF) 1 % IJ SOLN
INTRAMUSCULAR | Status: AC
  Filled 2019-04-30: qty 30

## 2019-04-30 MED ORDER — LIDOCAINE HCL 1 % IJ SOLN
INTRAMUSCULAR | Status: DC | PRN
  Administered 2019-04-30: 29 mL via INTRAMUSCULAR

## 2019-04-30 MED ORDER — 0.9 % SODIUM CHLORIDE (POUR BTL) OPTIME
TOPICAL | Status: DC | PRN
  Administered 2019-04-30: 1000 mL

## 2019-04-30 MED ORDER — BUPIVACAINE-EPINEPHRINE (PF) 0.25% -1:200000 IJ SOLN
INTRAMUSCULAR | Status: AC
  Filled 2019-04-30: qty 30

## 2019-04-30 MED ORDER — FENTANYL CITRATE (PF) 250 MCG/5ML IJ SOLN
INTRAMUSCULAR | Status: AC
  Filled 2019-04-30: qty 5

## 2019-04-30 MED ORDER — ONDANSETRON HCL 4 MG/2ML IJ SOLN
INTRAMUSCULAR | Status: AC
  Filled 2019-04-30: qty 2

## 2019-04-30 MED ORDER — PROPOFOL 500 MG/50ML IV EMUL
INTRAVENOUS | Status: DC | PRN
  Administered 2019-04-30: 50 ug/kg/min via INTRAVENOUS

## 2019-04-30 MED ORDER — FENTANYL CITRATE (PF) 100 MCG/2ML IJ SOLN: 25.0000 ug | INTRAMUSCULAR | Status: DC | PRN

## 2019-04-30 MED ORDER — PROPOFOL 10 MG/ML IV BOLUS
INTRAVENOUS | Status: AC
  Filled 2019-04-30: qty 20

## 2019-04-30 MED ORDER — PROPOFOL 10 MG/ML IV BOLUS
INTRAVENOUS | Status: DC | PRN
  Administered 2019-04-30 (×2): 10 mg via INTRAVENOUS

## 2019-04-30 MED ORDER — DEXAMETHASONE SODIUM PHOSPHATE 10 MG/ML IJ SOLN
INTRAMUSCULAR | Status: AC
  Filled 2019-04-30: qty 1

## 2019-04-30 MED ORDER — DEXAMETHASONE SODIUM PHOSPHATE 10 MG/ML IJ SOLN
INTRAMUSCULAR | Status: DC | PRN
  Administered 2019-04-30: 4 mg via INTRAVENOUS

## 2019-04-30 MED ORDER — ONDANSETRON HCL 4 MG/2ML IJ SOLN: 4.0000 mg | Freq: Once | INTRAMUSCULAR | Status: DC | PRN

## 2019-04-30 MED ORDER — FENTANYL CITRATE (PF) 100 MCG/2ML IJ SOLN
INTRAMUSCULAR | Status: DC | PRN
  Administered 2019-04-30 (×2): 25 ug via INTRAVENOUS

## 2019-04-30 SURGICAL SUPPLY — 38 items
BLADE AVERAGE 25X9 (BLADE) ×1 IMPLANT
BLADE SAW SGTL 81X20 HD (BLADE) IMPLANT
BNDG CONFORM 3 STRL LF (GAUZE/BANDAGES/DRESSINGS) IMPLANT
BNDG ELASTIC 4X5.8 VLCR STR LF (GAUZE/BANDAGES/DRESSINGS) ×3 IMPLANT
BNDG GAUZE ELAST 4 BULKY (GAUZE/BANDAGES/DRESSINGS) ×3 IMPLANT
CANISTER SUCT 3000ML PPV (MISCELLANEOUS) ×3 IMPLANT
COVER SURGICAL LIGHT HANDLE (MISCELLANEOUS) ×3 IMPLANT
COVER WAND RF STERILE (DRAPES) ×1 IMPLANT
DRAPE EXTREMITY T 121X128X90 (DISPOSABLE) ×3 IMPLANT
DRAPE HALF SHEET 40X57 (DRAPES) ×3 IMPLANT
DRAPE ORTHO SPLIT 77X108 STRL (DRAPES) ×3
DRAPE SURG ORHT 6 SPLT 77X108 (DRAPES) ×1 IMPLANT
DRSG ADAPTIC 3X8 NADH LF (GAUZE/BANDAGES/DRESSINGS) ×2 IMPLANT
ELECT REM PT RETURN 9FT ADLT (ELECTROSURGICAL) ×3
ELECTRODE REM PT RTRN 9FT ADLT (ELECTROSURGICAL) ×2 IMPLANT
GAUZE SPONGE 4X4 12PLY STRL (GAUZE/BANDAGES/DRESSINGS) ×1 IMPLANT
GAUZE SPONGE 4X4 12PLY STRL LF (GAUZE/BANDAGES/DRESSINGS) ×2 IMPLANT
GLOVE BIO SURGEON STRL SZ7.5 (GLOVE) ×3 IMPLANT
GLOVE BIOGEL PI IND STRL 6.5 (GLOVE) ×2 IMPLANT
GLOVE BIOGEL PI INDICATOR 6.5 (GLOVE) ×2
GOWN STRL REUS W/ TWL LRG LVL3 (GOWN DISPOSABLE) ×4 IMPLANT
GOWN STRL REUS W/ TWL XL LVL3 (GOWN DISPOSABLE) ×2 IMPLANT
GOWN STRL REUS W/TWL LRG LVL3 (GOWN DISPOSABLE) ×6
GOWN STRL REUS W/TWL XL LVL3 (GOWN DISPOSABLE) ×3
KIT BASIN OR (CUSTOM PROCEDURE TRAY) ×3 IMPLANT
KIT TURNOVER KIT B (KITS) ×3 IMPLANT
NDL HYPO 25GX1X1/2 BEV (NEEDLE) IMPLANT
NEEDLE HYPO 25GX1X1/2 BEV (NEEDLE) IMPLANT
NS IRRIG 1000ML POUR BTL (IV SOLUTION) ×3 IMPLANT
PACK GENERAL/GYN (CUSTOM PROCEDURE TRAY) ×3 IMPLANT
PAD ARMBOARD 7.5X6 YLW CONV (MISCELLANEOUS) ×6 IMPLANT
SPECIMEN JAR SMALL (MISCELLANEOUS) ×3 IMPLANT
SUT ETHILON 3 0 PS 1 (SUTURE) ×11 IMPLANT
SYR CONTROL 10ML LL (SYRINGE) ×2 IMPLANT
TOWEL GREEN STERILE (TOWEL DISPOSABLE) ×6 IMPLANT
TOWEL GREEN STERILE FF (TOWEL DISPOSABLE) ×3 IMPLANT
UNDERPAD 30X30 (UNDERPADS AND DIAPERS) ×3 IMPLANT
WATER STERILE IRR 1000ML POUR (IV SOLUTION) ×3 IMPLANT

## 2019-04-30 NOTE — Anesthesia Preprocedure Evaluation (Addendum)
Anesthesia Evaluation  Patient identified by MRN, date of birth, ID band Patient awake    Reviewed: Allergy & Precautions, NPO status , Patient's Chart, lab work & pertinent test results  Airway Mallampati: III  TM Distance: >3 FB Neck ROM: Full    Dental  (+) Edentulous Upper, Edentulous Lower   Pulmonary neg pulmonary ROS,    Pulmonary exam normal breath sounds clear to auscultation       Cardiovascular hypertension, + angina + CAD, + Past MI, + Cardiac Stents, + Peripheral Vascular Disease, +CHF and + DVT  Normal cardiovascular exam+ Cardiac Defibrillator  Rhythm:Regular Rate:Normal  ECG: SR, rate 77. Sinus rhythm Prolonged PR interval S/p LVAD   Neuro/Psych negative neurological ROS     GI/Hepatic Neg liver ROS, GERD  ,Gastroparesis   Endo/Other  diabetes, Insulin Dependent  Renal/GU ESRF and DialysisRenal disease     Musculoskeletal negative musculoskeletal ROS (+)   Abdominal   Peds  Hematology  (+) anemia , HLD   Anesthesia Other Findings PERIPHERAL VASCULAR DISEASE WITH GANGRENE NON-VIABLE TISSUE RIGHT LOWER EXTREMITY AND LEFT GREAT TOE  Reproductive/Obstetrics                           Anesthesia Physical Anesthesia Plan  ASA: IV  Anesthesia Plan: MAC   Post-op Pain Management:    Induction: Intravenous  PONV Risk Score and Plan: 1 and Ondansetron, Dexamethasone, Propofol infusion and Treatment may vary due to age or medical condition  Airway Management Planned: Simple Face Mask  Additional Equipment:   Intra-op Plan:   Post-operative Plan:   Informed Consent: I have reviewed the patients History and Physical, chart, labs and discussed the procedure including the risks, benefits and alternatives for the proposed anesthesia with the patient or authorized representative who has indicated his/her understanding and acceptance.       Plan Discussed with:  CRNA  Anesthesia Plan Comments:         Anesthesia Quick Evaluation

## 2019-04-30 NOTE — Progress Notes (Signed)
Patient ID: [name], male   DOB: 18-Feb-1966, 53 y.o.   MRN: [id number]   Advanced Heart Failure VAD Team Note  PCP-Cardiologist: No primary care provider on file.   Subjective:    Admitted for ischemic foot pain with need for amputation (transmetatarsal vs BKA).    Nausea improved after getting IV Phenergan.   INR 1.4 on heparin. No longer bleeding from central line site.   HD yesterday.   LVAD INTERROGATION:  HeartMate 3 LVAD:   Flow 3.9L  liters/min, speed 5500, power 4.4, PI 4.6.  No PI events.     Objective:    Vital Signs:   Temp:  [97.6 F (36.4 C)-99.4 F (37.4 C)] 99.4 F (37.4 C) (08/25 0336) Pulse Rate:  [73-95] 94 (08/25 0336) Resp:  [9-16] 13 (08/25 0336) BP: (108-138)/(42-105) 114/97 (08/25 0336) SpO2:  [93 %-100 %] 100 % (08/25 0336) Weight:  [86.6 kg-88.7 kg] 86.6 kg (08/25 0540) Last BM Date: 04/25/19 Mean arterial Pressure 90s-100s  Intake/Output:   Intake/Output Summary (Last 24 hours) at 04/30/2019 0746 Last data filed at 04/30/2019 0600 Gross per 24 hour  Intake 654.6 ml  Output 2000 ml  Net -1345.4 ml     Physical Exam    General: Well appearing this am. NAD.  HEENT: Normal. Neck: Supple, JVP 7-8 cm. Carotids OK.  Cardiac:  Mechanical heart sounds with LVAD hum present.  Lungs:  CTAB, normal effort.  Abdomen:  NT, ND, no HSM. No bruits or masses. +BS  LVAD exit site: Well-healed and incorporated. Dressing dry and intact. No erythema or drainage. Stabilization device present and accurately applied. Driveline dressing changed daily per sterile technique. 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  Left great toe is dark.  Neuro:  Alert & oriented x 3. Cranial nerves grossly intact. Moves all 4 extremities w/o difficulty. Affect pleasant     Telemetry   Sinus 90s Personally reviewed   Labs   Basic Metabolic Panel: Recent Labs  Lab 04/27/19 0552 04/28/19 0533 04/29/19 0531 04/30/19 0346  NA 133*  131* 131* 133*  K 4.2 4.3 4.1 3.9  CL 95* 93* 88* 96*  CO2 28 26 25 27   GLUCOSE 71 78 121* 95  BUN 18 31* 42* 24*  CREATININE 5.84* 8.38* 11.10* 7.02*  CALCIUM 8.8* 8.8* 8.6* 9.3    Liver Function Tests: Recent Labs  Lab 04/30/19 0346  AST 15  ALT 12  ALKPHOS 86  BILITOT 0.6  PROT 6.4*  ALBUMIN 2.6*   No results for input(s): LIPASE, AMYLASE in the last 168 hours. No results for input(s): AMMONIA in the last 168 hours.  CBC: Recent Labs  Lab 04/27/19 0552 04/28/19 0533 04/29/19 0531 04/30/19 0346  WBC 12.2* 13.5* 11.7* 14.7*  HGB 10.6* 10.1* 9.4* 9.6*  HCT 34.3* 32.7* 30.0* 30.3*  MCV 92.5 93.7 92.0 92.4  PLT 279 300 321 297    INR: Recent Labs  Lab 04/26/19 2132 04/27/19 0552 04/28/19 0533 04/29/19 0531 04/30/19 0346  INR 1.6* 1.8* 1.8* 1.6* 1.4*    Other results:    Imaging   No results found.   Medications:     Scheduled Medications: . aspirin EC  81 mg Oral Daily  . atorvastatin  40 mg Oral q1800  . calcitRIOL  0.5 mcg Oral Q M,W,F  . Chlorhexidine Gluconate Cloth  6 each Topical Q0600  . citalopram  20 mg Oral Daily  . erythromycin  250 mg Oral TID  .  gabapentin  300 mg Oral BID  . insulin aspart  0-9 Units Subcutaneous TID WC  . insulin aspart  7 Units Subcutaneous Q supper  . insulin glargine  10 Units Subcutaneous QHS  . LORazepam  0.5 mg Oral BID  . mouth rinse  15 mL Mouth Rinse BID  . metoCLOPramide  10 mg Oral TID WC  . midodrine  5 mg Oral Q M,W,F  . multivitamin  1 tablet Oral QHS  . traMADol  50 mg Oral Q6H    Infusions: . sodium chloride    .  ceFAZolin (ANCEF) IV    . heparin 1,300 Units/hr (04/29/19 1301)    PRN Medications: acetaminophen, docusate sodium, ondansetron (ZOFRAN) IV, oxyCODONE, promethazine, promethazine  Assessment/Plan:    1. PAD/ischemic feet: Plan for amputation of RLE today by Dr. Donzetta Matters (R transmetatarsal versus BKA). WBCs 14.  He completed vancomycin for ?osteomyelitis on 8/19 (was getting  with HD). ESR 79 but afebrile with cultures NGTD.  - To OR today.  - Will also eventually need angiography LLE given appearance of left great toe (dark, appears ischemic).   - Off coumadin, on heparin gtt with INR 1.4.  - Continue oxycodone and ultram for pain. Watch for oversedation (seems ok this morning).  - Currently off abx.  2. Chronic systolic CHF: Ischemic cardiomyopathy. St Jude ICD. NSTEMI in March 2018 with DES to LAD and RCA, complicated by cardiogenic shock, low output requiring milrinone. Echo 8/18 with EF 20-25%, moderate MR. CPX (8/18) with severe functional impairment due to HF. He is s/p HM3 LVAD placement. He had pump thrombosis 6/19 due to [facility] outflow graft kinking. He had pump exchange for another HM3. INR1.4today.  - Volume managed at dialysis, MWF.   - Continue ASA 81and warfarin with INR goal 2-2.5 (warfarin on hold and on heparin pre-surgery). - He has been referred to [facility] for evaluation for heart/kidney transplantand recently saw Dr Mosetta Pigeon =>he is not candidate for transplantnowgiven extensive PAD.  - Consistently elevated MAP on non-HD days.  Will add low dose hydralazine 12.5 mg tid on non-HD days (not today as going for surgery).  3. ESRD: HD on M-W-F with midodrine pre-HD.  - HD today.  4.  CAD: NSTEMI in 3/18. LHC with 99% ulcerated lesion proximal RCA with left to right collaterals, 95% mid LAD stenosis after mid LAD stent. s/p PCI to RCA and LAD on 11/25/16. No chest pain.  - Continue statin and ASA. 5. h/o DVTs: Factor V Leiden heterozygote. -Anticoagulated. 6. Diabetic gastroparesis: He has seen gastroparesis specialist at [facility]. Surprisingly, gastric emptying study was normal. However, electrogastrogram showed poor gastric accomodation/capacity. Therefore, small frequent meals recommended.Nausea improved now with IV Phenergan.  - Continue reglan 10 mg tid/ac and Marinol.  - Continue Protonix bid.  - He will continue  erythromycinindefinitely.  - Limit narcotic use as much as possible.  6. DM:SSI and home insulin regimen.  7. HTN:As above, will start low dose hydralazine on non-HD days but not today as going for surgery.   I reviewed the LVAD parameters from today, and compared the results to the patient's prior recorded data.  No programming changes were made.  The LVAD is functioning within specified parameters.  The patient performs LVAD self-test daily.  LVAD interrogation was negative for any significant power changes, alarms or PI events/speed drops.  LVAD equipment check completed and is in good working order.  Back-up equipment present.   LVAD education done on emergency procedures and precautions and  reviewed exit site care.  Length of Stay: 4  Loralie Champagne, MD 04/30/2019, 7:46 AM  VAD Team --- VAD ISSUES ONLY--- Pager [phone] (7am - 7am)  Advanced Heart Failure Team  Pager [phone] (M-F; 7a - 4p)  Please contact [facility] for night-coverage after hours (4p -7a ) and weekends on [email or web address]

## 2019-04-30 NOTE — Progress Notes (Signed)
ANTICOAGULATION CONSULT NOTE - Follow Up Consult  Pharmacy Consult for heparin Indication: LVAD  Allergies  Allergen Reactions  . Metformin And Related Diarrhea    Patient Measurements: Height: 5' 7"  (170.2 cm) Weight: 190 lb 14.7 oz (86.6 kg) IBW/kg (Calculated) : 66.1 Heparin Dosing Weight: 83.2 kg  Vital Signs: Temp: 99.4 F (37.4 C) (08/25 0336) Temp Source: Oral (08/25 0336) BP: 114/97 (08/25 0336) Pulse Rate: 94 (08/25 0336)  Labs: Recent Labs    04/28/19 0533 04/28/19 1657 04/29/19 0531 04/29/19 0532 04/30/19 0326 04/30/19 0346  HGB 10.1*  --  9.4*  --   --  9.6*  HCT 32.7*  --  30.0*  --   --  30.3*  PLT 300  --  321  --   --  297  LABPROT 20.6*  --  18.6*  --   --  17.3*  INR 1.8*  --  1.6*  --   --  1.4*  HEPARINUNFRC 0.30 0.41  --  0.33 0.18*  --   CREATININE 8.38*  --  11.10*  --   --  7.02*    Estimated Creatinine Clearance: 12.6 mL/min (A) (by C-G formula based on SCr of 7.02 mg/dL (H)).   Assessment: 34 yom w/ hx Heartmate 3 LVAD, factor V Leiden, and PAF - presenting with severe pain (plan for amputation this week). On warfarin PTA - regimen was 5 mg daily except 7.5 mg on Tuesday; however, INR elevated so plan for 2.5 mg on Tues, 5 mg on Wed and Thursday, hold Friday.   INR today is 1.4,  Warfarin is on hold due to plan for amputation of RLE.   Heparin level 0.18, subtherapeutic on heparin drip rate 1300 units/hr. Hgb 9.6, plt WNL. LDH 170s - stable. No bleeding reported- central line site improved.   Goal of Therapy:  Heparin level 0.3-0.7 units/ml INR 2-2.5 Monitor platelets by anticoagulation protocol: Yes   Plan:  Increase heparin drip to 1400 units/hr  Plan for procedure on Thursday, continue to hold warfarin at this time Daily heparin level and CBC Monitor bleeding  Antonietta Jewel, PharmD, BCCCP Clinical Pharmacist  Phone: [phone]  Please check AMION for all [place] phone numbers After 10:00 PM, call [place]  [phone] 04/30/2019       7:35 AM  Please check [email or web address] for unit-specific pharmacist phone numbers

## 2019-04-30 NOTE — Transfer of Care (Signed)
Immediate Anesthesia Transfer of Care Note  Patient: [name]  Procedure(s) Performed: RIGHT TRANSMETATARSAL AMPUTATION (Right Foot)  Patient Location: PACU  Anesthesia Type:MAC  Level of Consciousness: drowsy  Airway & Oxygen Therapy: Patient Spontanous Breathing and Patient connected to face mask oxygen  Post-op Assessment: Report given to RN and Post -op Vital signs reviewed and stable  Post vital signs: Reviewed  Last Vitals:  Vitals Value Taken Time  BP 111/101 04/30/19 1211  Temp    Pulse 95 04/30/19 1215  Resp 13 04/30/19 1215  SpO2 100 % 04/30/19 1215  Vitals shown include unvalidated device data.  Last Pain:  Vitals:   04/30/19 0840  TempSrc:   PainSc: 0-No pain      Patients Stated Pain Goal: 2 (45/36/46 8032)  Complications: No apparent anesthesia complications

## 2019-04-30 NOTE — Progress Notes (Signed)
VAD Coordinator Procedure Note:   VAD Coordinator met patient in holding area. Pt undergoing right metatarsal or BKA per Dr.Cain.  Hemodynamics and VAD parameters monitored by myself and anesthesia throughout the procedure. Blood pressures were obtained with automatic cuff on left arm and correlated with Doppler MAP.    Time: Doppler Auto  BP Flow PI Power Speed  Pre-procedure:           10:45 112 131/108 (115) 3.5 5.3 4.5 5500           Secation Induction:          11:15  122/105 (110) 3.3 7.8 4.5    11:30  114/95 (103) 4.1 4.7 4.4    11:45  99/80   (88) 4.2 4.1 4.5    12:00  105/90 (97) 4.2 3.9 4.4            Recovery Area:          12:15  111/101 (105) 3.8 5.3 4.7     Patient tolerated the procedure well. VAD Coordinator accompanied and remained with patient in recovery area and accompanied patient back to [place].   Patient Disposition: [place] RN, [place] Coordinator [phone]

## 2019-04-30 NOTE — Progress Notes (Signed)
ANTICOAGULATION CONSULT NOTE - Follow Up Consult  Pharmacy Consult for heparin Indication: LVAD  Allergies  Allergen Reactions  . Metformin And Related Diarrhea    Patient Measurements: Height: 5' 7.01" (170.2 cm) Weight: 190 lb 14.7 oz (86.6 kg) IBW/kg (Calculated) : 66.12 Heparin Dosing Weight: 83.2 kg  Vital Signs: Temp: 98.3 F (36.8 C) (08/25 1956) Temp Source: Oral (08/25 1956) BP: 137/105 (08/25 1956) Pulse Rate: 91 (08/25 1956)  Labs: Recent Labs    04/28/19 0533  04/29/19 0531 04/29/19 0532 04/30/19 0326 04/30/19 0346 04/30/19 1930  HGB 10.1*  --  9.4*  --   --  9.6*  --   HCT 32.7*  --  30.0*  --   --  30.3*  --   PLT 300  --  321  --   --  297  --   LABPROT 20.6*  --  18.6*  --   --  17.3*  --   INR 1.8*  --  1.6*  --   --  1.4*  --   HEPARINUNFRC 0.30   < >  --  0.33 0.18*  --  0.29*  CREATININE 8.38*  --  11.10*  --   --  7.02*  --    < > = values in this interval not displayed.    Estimated Creatinine Clearance: 12.6 mL/min (A) (by C-G formula based on SCr of 7.02 mg/dL (H)).   Assessment: 36 yom w/ hx Heartmate 3 LVAD, factor V Leiden, and PAF - presenting with severe pain (plan for amputation this week). On warfarin PTA - regimen was 5 mg daily except 7.5 mg on Tuesday; however, INR elevated so plan for 2.5 mg on Tues, 5 mg on Wed and Thursday, hold Friday.   INR today is 1.4,  Warfarin is on hold due to plan for amputation of RLE.   Heparin level 0.29, just below goal range on heparin drip rate 1400 units/hr. Will increase rate slightly and recheck in am.    Goal of Therapy:  Heparin level 0.3-0.7 units/ml INR 2-2.5 Monitor platelets by anticoagulation protocol: Yes   Plan:  Increase heparin drip to 1500 units/hr  Plan for procedure on Thursday, continue to hold warfarin at this time Daily heparin level and CBC Monitor bleeding  Erin Hearing PharmD., BCPS Clinical Pharmacist 04/30/2019 8:37 PM

## 2019-04-30 NOTE — Anesthesia Procedure Notes (Signed)
Procedure Name: MAC Date/Time: 04/30/2019 11:15 AM Performed by: Janene Harvey, CRNA Pre-anesthesia Checklist: Patient identified, Emergency Drugs available, Suction available, Patient being monitored and Timeout performed Oxygen Delivery Method: Simple face mask Dental Injury: Teeth and Oropharynx as per pre-operative assessment

## 2019-04-30 NOTE — Op Note (Signed)
    Patient name: [name] MRN: [id number] DOB: 1965-11-29 Sex: male  04/30/2019 Pre-operative Diagnosis: Necrotic toes right foot Post-operative diagnosis:  Same Surgeon:  Erlene Quan C. Donzetta Matters, MD Procedure Performed:  Right transmetatarsal amputation  Indications: 53 year old male history of right lower extremity revascularization fourth toe amputation which is now failed to heal.  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  Findings: There is adequate bleeding on the dorsum of the foot as well as plantar flap.   Procedure:  The patient was identified in the holding area and taken to the operating was placed supine operative MAC anesthesia induced.  Sterilely prepped and draped in the right foot in usual fashion antibiotics were minister timeout called.  We began with anesthetizing foot with 1% lidocaine with epinephrine and quarter percent Marcaine.  We drew out a fishmouth type incision traces with 10 blade down to level of the bones.  We resected back the cuboid and cuneiform bone.  We created a plantar flap.  Bones were removed.  We rongeured the most medial smoothed with rasp.  We irrigated with heparinized saline.  We approximated the skin with interrupted 3-0 nylon sutures.  Sterile dressing was placed.  He tolerated procedure well immediate complication.  EBL: 20cc    Dohn Stclair C. Donzetta Matters, MD [facility] Office: [phone] Pager: [phone]

## 2019-04-30 NOTE — Progress Notes (Signed)
Dr Roanna Banning fully updated, aware CBG 50, Ok to tx pt.  back to [facility]

## 2019-04-30 NOTE — Progress Notes (Signed)
  Progress Note    04/30/2019 10:31 AM Day of Surgery  Subjective: No overnight issues  Vitals:   04/30/19 0714 04/30/19 0851  BP: 112/90 (!) 109/94  Pulse: 89   Resp: (!) 21   Temp: 99.5 F (37.5 C)   SpO2: 100%     Physical Exam: Awake alert oriented Nonlabored respirations Right foot with good signal level of the ankle and is warm Discoloration on the dorsum of the foot plantar aspect appears viable  CBC    Component Value Date/Time   WBC 14.7 (H) 04/30/2019 0346   RBC 3.28 (L) 04/30/2019 0346   HGB 9.6 (L) 04/30/2019 0346   HGB 11.7 (L) 09/07/2016 1457   HCT 30.3 (L) 04/30/2019 0346   HCT 37.5 09/07/2016 1457   PLT 297 04/30/2019 0346   PLT 279 09/07/2016 1457   MCV 92.4 04/30/2019 0346   MCV 81 09/07/2016 1457   MCH 29.3 04/30/2019 0346   MCHC 31.7 04/30/2019 0346   RDW 15.8 (H) 04/30/2019 0346   RDW 14.9 09/07/2016 1457   LYMPHSABS 1.2 04/01/2019 1824   LYMPHSABS 1.3 09/07/2016 1457   MONOABS 1.3 (H) 04/01/2019 1824   EOSABS 0.4 04/01/2019 1824   EOSABS 0.2 09/07/2016 1457   BASOSABS 0.0 04/01/2019 1824   BASOSABS 0.0 09/07/2016 1457    BMET    Component Value Date/Time   NA 133 (L) 04/30/2019 0346   NA 144 05/11/2017 0938   K 3.9 04/30/2019 0346   CL 96 (L) 04/30/2019 0346   CO2 27 04/30/2019 0346   GLUCOSE 95 04/30/2019 0346   BUN 24 (H) 04/30/2019 0346   BUN 68 (H) 05/11/2017 0938   CREATININE 7.02 (H) 04/30/2019 0346   CREATININE 1.63 (H) 11/09/2016 1505   CALCIUM 9.3 04/30/2019 0346   CALCIUM 8.7 06/27/2017 1152   GFRNONAA 8 (L) 04/30/2019 0346   GFRNONAA 48 (L) 11/09/2016 1505   GFRAA 9 (L) 04/30/2019 0346   GFRAA 55 (L) 11/09/2016 1505    INR    Component Value Date/Time   INR 1.4 (H) 04/30/2019 0346     Intake/Output Summary (Last 24 hours) at 04/30/2019 1031 Last data filed at 04/30/2019 0600 Gross per 24 hour  Intake 534.6 ml  Output 2000 ml  Net -1465.4 ml     Assessment/plan:  53 y.o. [name] is s/p right lower  extremity revascularization posterior tibial artery and toe amputation now discoloration of the dorsum of the foot.  We will plan transmetatarsal versus transtibial amputation today.  I discussed risk benefits with patient he agrees to proceed.  Likely to need left lower extremity revascularization in hopes of saving left foot in the near future.   Jermiah Howton C. Donzetta Matters, MD [facility] [place] Office: [phone] Pager: [phone]  04/30/2019 10:31 AM

## 2019-04-30 NOTE — Plan of Care (Signed)
  Problem: Cardiac: Goal: LVAD will function as expected and patient will experience no clinical alarms Outcome: Not Progressing   Problem: Health Behavior/Discharge Planning: Goal: Ability to manage health-related needs will improve Outcome: Not Progressing   Problem: Clinical Measurements: Goal: Ability to maintain clinical measurements within normal limits will improve Outcome: Not Progressing Goal: Will remain free from infection Outcome: Not Progressing Goal: Diagnostic test results will improve Outcome: Not Progressing Goal: Respiratory complications will improve Outcome: Not Progressing Goal: Cardiovascular complication will be avoided Outcome: Not Progressing   Problem: Activity: Goal: Risk for activity intolerance will decrease Outcome: Not Progressing   Problem: Nutrition: Goal: Adequate nutrition will be maintained Outcome: Not Progressing   Problem: Coping: Goal: Level of anxiety will decrease Outcome: Not Progressing   Problem: Elimination: Goal: Will not experience complications related to bowel motility Outcome: Not Progressing Goal: Will not experience complications related to urinary retention Outcome: Not Progressing   Problem: Pain Managment: Goal: General experience of comfort will improve Outcome: Not Progressing   Problem: Safety: Goal: Ability to remain free from injury will improve Outcome: Not Progressing   Problem: Skin Integrity: Goal: Risk for impaired skin integrity will decrease Outcome: Not Progressing

## 2019-04-30 NOTE — Anesthesia Postprocedure Evaluation (Signed)
Anesthesia Post Note  Patient: [name]  Procedure(s) Performed: RIGHT TRANSMETATARSAL AMPUTATION (Right Foot)     Patient location during evaluation: PACU Anesthesia Type: MAC Level of consciousness: awake and alert Pain management: pain level controlled Vital Signs Assessment: post-procedure vital signs reviewed and stable Respiratory status: spontaneous breathing, nonlabored ventilation, respiratory function stable and patient connected to nasal cannula oxygen Cardiovascular status: stable and blood pressure returned to baseline Postop Assessment: no apparent nausea or vomiting Anesthetic complications: no    Last Vitals:  Vitals:   04/30/19 1246 04/30/19 1543  BP: (!) 125/104 (!) 131/98  Pulse:  89  Resp:  19  Temp:  36.7 C  SpO2:  100%    Last Pain:  Vitals:   04/30/19 1712  TempSrc:   PainSc: 6                  Ryan P Ellender

## 2019-04-30 NOTE — Progress Notes (Signed)
Admit: 04/26/2019 LOS: 4  70M ESRD w/ LVAD and ischemic R foot for amputation  Subjective:  . No interval events, to OR today . HD yesterday, no issues 2L UF  08/24 0701 - 08/25 0700 In: 654.6 [P.O.:340; I.V.:314.6] Out: 2000   Filed Weights   04/29/19 1607 04/29/19 2014 04/30/19 0540  Weight: 88.7 kg 86.7 kg 86.6 kg    Scheduled Meds: . aspirin EC  81 mg Oral Daily  . atorvastatin  40 mg Oral q1800  . calcitRIOL  0.5 mcg Oral Q M,W,F  . Chlorhexidine Gluconate Cloth  6 each Topical Q0600  . citalopram  20 mg Oral Daily  . erythromycin  250 mg Oral TID  . gabapentin  300 mg Oral BID  . insulin aspart  0-9 Units Subcutaneous TID WC  . insulin aspart  7 Units Subcutaneous Q supper  . insulin glargine  10 Units Subcutaneous QHS  . LORazepam  0.5 mg Oral BID  . mouth rinse  15 mL Mouth Rinse BID  . metoCLOPramide  10 mg Oral TID WC  . midodrine  5 mg Oral Q M,W,F  . multivitamin  1 tablet Oral QHS  . traMADol  50 mg Oral Q6H   Continuous Infusions: . sodium chloride 10 mL/hr at 04/30/19 0849  .  ceFAZolin (ANCEF) IV    . heparin 1,300 Units/hr (04/30/19 0851)   PRN Meds:.acetaminophen, docusate sodium, ondansetron (ZOFRAN) IV, oxyCODONE, promethazine, promethazine  Current Labs: reviewed   Physical Exam:  Blood pressure (!) 109/94, pulse 89, temperature 99.5 F (37.5 C), temperature source Oral, resp. rate (!) 21, height 5' 7"  (1.702 m), weight 86.6 kg, SpO2 100 %. NAD COnt hum from LVAD RRR NO LEE Ischemic/drygangrenous changes at distal R foot AAO x3 TDC L IJ  Dialysis Orders:  MWF at [facility] 4:15hr, 400/800, EDW 84kg, 2K/3Ca, TDC, no heparin - Hectoral 18mg IV q HD - Mircera 2020m q 2 weeks (last 8/18) - Venofer 5068mV weekly  A 1.  R foot ischemia/dry gangrene: Planned amputation (R TMA v. BKA, L great toe) 8/25 per VVS. Pain control and heparin bridging per HF team. 2.  ESRD: Continue HD per MWF schedule tomorrow 2K No hep 1-2L UF as able 3.   Hypertension/volume: BP controlled, euvolemic at this time. 4.  Anemia:  Hgb 9.6 - not due for ESA yet. Anticipate post-op drop, transfuse prn. 5.  Metabolic bone disease: Ca OK, 6.  CAD/ICM/LVAD: Per HF team 7.  Type 2 DM with known severe gastroparesis  RyaPearson Grippe 04/30/2019, 9:36 AM  Recent Labs  Lab 04/28/19 0533 04/29/19 0531 04/30/19 0346  NA 131* 131* 133*  K 4.3 4.1 3.9  CL 93* 88* 96*  CO2 26 25 27   GLUCOSE 78 121* 95  BUN 31* 42* 24*  CREATININE 8.38* 11.10* 7.02*  CALCIUM 8.8* 8.6* 9.3   Recent Labs  Lab 04/28/19 0533 04/29/19 0531 04/30/19 0346  WBC 13.5* 11.7* 14.7*  HGB 10.1* 9.4* 9.6*  HCT 32.7* 30.0* 30.3*  MCV 93.7 92.0 92.4  PLT 300 321 297

## 2019-05-01 ENCOUNTER — Inpatient Hospital Stay (HOSPITAL_COMMUNITY): Payer: BC Managed Care – PPO

## 2019-05-01 ENCOUNTER — Encounter (HOSPITAL_COMMUNITY): Payer: Self-pay | Admitting: Vascular Surgery

## 2019-05-01 LAB — GLUCOSE, CAPILLARY
Glucose-Capillary: 243 mg/dL — ABNORMAL HIGH (ref 70–99)
Glucose-Capillary: 218 mg/dL — ABNORMAL HIGH (ref 70–99)
Glucose-Capillary: 293 mg/dL — ABNORMAL HIGH (ref 70–99)
Glucose-Capillary: 162 mg/dL — ABNORMAL HIGH (ref 70–99)

## 2019-05-01 LAB — HEPARIN LEVEL (UNFRACTIONATED)
Heparin Unfractionated: 0.45 IU/mL (ref 0.30–0.70)
Heparin Unfractionated: 0.36 IU/mL (ref 0.30–0.70)

## 2019-05-01 LAB — BASIC METABOLIC PANEL
Anion gap: 11 (ref 5–15)
BUN: 41 mg/dL — ABNORMAL HIGH (ref 6–20)
CO2: 24 mmol/L (ref 22–32)
Calcium: 9.1 mg/dL (ref 8.9–10.3)
Chloride: 95 mmol/L — ABNORMAL LOW (ref 98–111)
Creatinine, Ser: 9.14 mg/dL — ABNORMAL HIGH (ref 0.61–1.24)
GFR calc Af Amer: 7 mL/min — ABNORMAL LOW (ref >60)
GFR calc non Af Amer: 6 mL/min — ABNORMAL LOW (ref >60)
Glucose, Bld: 287 mg/dL — ABNORMAL HIGH (ref 70–99)
Potassium: 4.9 mmol/L (ref 3.5–5.1)
Sodium: 130 mmol/L — ABNORMAL LOW (ref 135–145)

## 2019-05-01 LAB — CBC
HCT: 31 % — ABNORMAL LOW (ref 39.0–52.0)
Hemoglobin: 9.4 g/dL — ABNORMAL LOW (ref 13.0–17.0)
MCH: 28.4 pg (ref 26.0–34.0)
MCHC: 30.3 g/dL (ref 30.0–36.0)
MCV: 93.7 fL (ref 80.0–100.0)
Platelets: 326 10*3/uL (ref 150–400)
RBC: 3.31 MIL/uL — ABNORMAL LOW (ref 4.22–5.81)
RDW: 15.9 % — ABNORMAL HIGH (ref 11.5–15.5)
WBC: 23.8 10*3/uL — ABNORMAL HIGH (ref 4.0–10.5)
nRBC: 0.3 % — ABNORMAL HIGH (ref 0.0–0.2)

## 2019-05-01 LAB — PROTIME-INR
INR: 1.5 — ABNORMAL HIGH (ref 0.8–1.2)
Prothrombin Time: 18.3 seconds — ABNORMAL HIGH (ref 11.4–15.2)

## 2019-05-01 LAB — LACTATE DEHYDROGENASE: LDH: 168 U/L (ref 98–192)

## 2019-05-01 MED ORDER — HYDRALAZINE HCL 25 MG PO TABS
25.0000 mg | ORAL_TABLET | ORAL | Status: DC
  Administered 2019-05-02 – 2019-05-12 (×20): 25 mg via ORAL
  Filled 2019-05-01 (×20): qty 1

## 2019-05-01 MED ORDER — HEPARIN SODIUM (PORCINE) 1000 UNIT/ML IJ SOLN
INTRAMUSCULAR | Status: AC
  Filled 2019-05-01: qty 4

## 2019-05-01 MED ORDER — LORAZEPAM 0.5 MG PO TABS
ORAL_TABLET | ORAL | Status: AC
  Administered 2019-05-01: 0.5 mg via ORAL
  Filled 2019-05-01: qty 1

## 2019-05-01 MED ORDER — MIDODRINE HCL 5 MG PO TABS
ORAL_TABLET | ORAL | Status: AC
  Administered 2019-05-01: 5 mg via ORAL
  Filled 2019-05-01: qty 1

## 2019-05-01 MED ORDER — CALCITRIOL 0.5 MCG PO CAPS
ORAL_CAPSULE | ORAL | Status: AC
  Administered 2019-05-01: 0.5 ug via ORAL
  Filled 2019-05-01: qty 1

## 2019-05-01 NOTE — Progress Notes (Signed)
ANTICOAGULATION CONSULT NOTE - Follow Up Consult  Pharmacy Consult for heparin Indication: LVAD  Allergies  Allergen Reactions  . Metformin And Related Diarrhea    Patient Measurements: Height: 5' 7.01" (170.2 cm) Weight: 184 lb 15.5 oz (83.9 kg) IBW/kg (Calculated) : 66.12 Heparin Dosing Weight: 83.2 kg  Vital Signs: Temp: 98.7 F (37.1 C) (08/26 1616) Temp Source: Oral (08/26 1616) BP: 110/67 (08/26 1616) Pulse Rate: 89 (08/26 1616)  Labs: Recent Labs    04/29/19 0531  04/30/19 0346 04/30/19 1930 05/01/19 0420 05/01/19 1700  HGB 9.4*  --  9.6*  --  9.4*  --   HCT 30.0*  --  30.3*  --  31.0*  --   PLT 321  --  297  --  326  --   LABPROT 18.6*  --  17.3*  --  18.3*  --   INR 1.6*  --  1.4*  --  1.5*  --   HEPARINUNFRC  --    < >  --  0.29* 0.45 0.36  CREATININE 11.10*  --  7.02*  --  9.14*  --    < > = values in this interval not displayed.    Estimated Creatinine Clearance: 9.6 mL/min (A) (by C-G formula based on SCr of 9.14 mg/dL (H)).   Assessment: 69 yom w/ hx Heartmate 3 LVAD, factor V Leiden, and PAF - presenting with severe pain (plan for amputation this week). On warfarin PTA - regimen was 5 mg daily except 7.5 mg on Tuesday; however, INR elevated so plan for 2.5 mg on Tues, 5 mg on Wed and Thursday, hold Friday.   INR today is 1.5, warfarin is on hold due to plan for amputation of RLE.   Heparin level 0.36, therapeutic, on heparin drip rate 1500 units/hr. No s/sx of bleeding. No infusion issues per charting.   Goal of Therapy:  Heparin level 0.3-0.7 units/ml INR 2-2.5 Monitor platelets by anticoagulation protocol: Yes   Plan:  Continue heparin drip at 1500 units/hr  Plan for procedure on Thursday, continue to hold warfarin at this time Daily heparin level and CBC Monitor bleeding  Erin Hearing PharmD., BCPS Clinical Pharmacist 05/01/2019 5:58 PM  Please check AMION for all [place] phone numbers After 10:00 PM, call [place]  [phone] 05/01/2019 5:58 PM

## 2019-05-01 NOTE — Progress Notes (Signed)
  Progress Note    05/01/2019 9:42 AM 1 Day Post-Op  Subjective:  No overnight complaints  Vitals:   05/01/19 0830 05/01/19 0900  BP: (!) 143/91 (!) 163/82  Pulse: 92 89  Resp:    Temp:    SpO2: 100% 100%    Physical Exam: Awake alert oriented currently on dialysis Right lower extremity is warm Right foot dressing clean dry intact Stable left great toe gangrenous changes  CBC    Component Value Date/Time   WBC 23.8 (H) 05/01/2019 0420   RBC 3.31 (L) 05/01/2019 0420   HGB 9.4 (L) 05/01/2019 0420   HGB 11.7 (L) 09/07/2016 1457   HCT 31.0 (L) 05/01/2019 0420   HCT 37.5 09/07/2016 1457   PLT 326 05/01/2019 0420   PLT 279 09/07/2016 1457   MCV 93.7 05/01/2019 0420   MCV 81 09/07/2016 1457   MCH 28.4 05/01/2019 0420   MCHC 30.3 05/01/2019 0420   RDW 15.9 (H) 05/01/2019 0420   RDW 14.9 09/07/2016 1457   LYMPHSABS 1.2 04/01/2019 1824   LYMPHSABS 1.3 09/07/2016 1457   MONOABS 1.3 (H) 04/01/2019 1824   EOSABS 0.4 04/01/2019 1824   EOSABS 0.2 09/07/2016 1457   BASOSABS 0.0 04/01/2019 1824   BASOSABS 0.0 09/07/2016 1457    BMET    Component Value Date/Time   NA 130 (L) 05/01/2019 0420   NA 144 05/11/2017 0938   K 4.9 05/01/2019 0420   CL 95 (L) 05/01/2019 0420   CO2 24 05/01/2019 0420   GLUCOSE 287 (H) 05/01/2019 0420   BUN 41 (H) 05/01/2019 0420   BUN 68 (H) 05/11/2017 0938   CREATININE 9.14 (H) 05/01/2019 0420   CREATININE 1.63 (H) 11/09/2016 1505   CALCIUM 9.1 05/01/2019 0420   CALCIUM 8.7 06/27/2017 1152   GFRNONAA 6 (L) 05/01/2019 0420   GFRNONAA 48 (L) 11/09/2016 1505   GFRAA 7 (L) 05/01/2019 0420   GFRAA 55 (L) 11/09/2016 1505    INR    Component Value Date/Time   INR 1.5 (H) 05/01/2019 0420     Intake/Output Summary (Last 24 hours) at 05/01/2019 0942 Last data filed at 04/30/2019 1700 Gross per 24 hour  Intake 556.72 ml  Output 50 ml  Net 506.72 ml     Assessment:  53 y.o. male is s/p right metatarsal amputation after revascularization  and failed toe amputation.  Plan will be for left lower extremity revascularization attempt endovascular tomorrow.  Continue heparin drip to the time of procedure and hold Coumadin for now.  N.p.o. past midnight.  Breawna Montenegro C. Donzetta Matters, MD [facility] Office: [phone] Pager: [phone]  05/01/2019 9:42 AM

## 2019-05-01 NOTE — Plan of Care (Signed)
  Problem: Cardiac: Goal: LVAD will function as expected and patient will experience no clinical alarms Outcome: Progressing   Problem: Health Behavior/Discharge Planning: Goal: Ability to manage health-related needs will improve Outcome: Progressing   Problem: Clinical Measurements: Goal: Ability to maintain clinical measurements within normal limits will improve Outcome: Progressing Goal: Will remain free from infection Outcome: Progressing Goal: Diagnostic test results will improve Outcome: Progressing Goal: Respiratory complications will improve Outcome: Progressing Goal: Cardiovascular complication will be avoided Outcome: Progressing   Problem: Activity: Goal: Risk for activity intolerance will decrease Outcome: Progressing   Problem: Nutrition: Goal: Adequate nutrition will be maintained Outcome: Progressing   Problem: Coping: Goal: Level of anxiety will decrease Outcome: Progressing   Problem: Elimination: Goal: Will not experience complications related to bowel motility Outcome: Progressing Goal: Will not experience complications related to urinary retention Outcome: Progressing   Problem: Pain Managment: Goal: General experience of comfort will improve Outcome: Progressing   Problem: Safety: Goal: Ability to remain free from injury will improve Outcome: Progressing   Problem: Skin Integrity: Goal: Risk for impaired skin integrity will decrease Outcome: Progressing

## 2019-05-01 NOTE — Progress Notes (Signed)
Patient ID: [name], male   DOB: 1965-11-26, 53 y.o.   MRN: [id number]   Advanced Heart Failure VAD Team Note  PCP-Cardiologist: No primary care provider on file.   Subjective:    Admitted for ischemic foot pain with need for amputation => transmetatarsal amputation right foot on 8/25.   No nausea today, seen on HD.   INR 1.5 on heparin. No longer bleeding from central line site.   LVAD INTERROGATION:  HeartMate 3 LVAD:   Flow 3.9L  liters/min, speed 5500, power 4.2, PI 5.      Objective:    Vital Signs:   Temp:  [97.2 F (36.2 C)-98.3 F (36.8 C)] 98.3 F (36.8 C) (08/26 0340) Pulse Rate:  [86-92] 86 (08/26 0340) Resp:  [11-19] 13 (08/26 0340) BP: (109-139)/(94-105) 135/105 (08/26 0340) SpO2:  [99 %-100 %] 99 % (08/26 0340) Weight:  [86.2 kg] 86.2 kg (08/26 0340) Last BM Date: 04/25/19 Mean arterial Pressure 90s-100s  Intake/Output:   Intake/Output Summary (Last 24 hours) at 05/01/2019 0757 Last data filed at 04/30/2019 1700 Gross per 24 hour  Intake 556.72 ml  Output 50 ml  Net 506.72 ml     Physical Exam    General: Well appearing this am. NAD.  HEENT: Normal. Neck: Supple, JVP 7-8 cm. Carotids OK.  Cardiac:  Mechanical heart sounds with LVAD hum present.  Lungs:  CTAB, normal effort.  Abdomen:  NT, ND, no HSM. No bruits or masses. +BS  LVAD exit site: Well-healed and incorporated. Dressing dry and intact. No erythema or drainage. Stabilization device present and accurately applied. Driveline dressing changed daily per sterile technique. Extremities:  Right foot wrapped, left great toe dark.   Neuro:  Alert & oriented x 3. Cranial nerves grossly intact. Moves all 4 extremities w/o difficulty. Affect pleasant     Telemetry   Sinus 90s Personally reviewed   Labs   Basic Metabolic Panel: Recent Labs  Lab 04/27/19 0552 04/28/19 0533 04/29/19 0531 04/30/19 0346 05/01/19 0420  NA 133* 131* 131* 133* 130*  K 4.2 4.3 4.1 3.9 4.9  CL 95* 93*  88* 96* 95*  CO2 28 26 25 27 24   GLUCOSE 71 78 121* 95 287*  BUN 18 31* 42* 24* 41*  CREATININE 5.84* 8.38* 11.10* 7.02* 9.14*  CALCIUM 8.8* 8.8* 8.6* 9.3 9.1    Liver Function Tests: Recent Labs  Lab 04/30/19 0346  AST 15  ALT 12  ALKPHOS 86  BILITOT 0.6  PROT 6.4*  ALBUMIN 2.6*   No results for input(s): LIPASE, AMYLASE in the last 168 hours. No results for input(s): AMMONIA in the last 168 hours.  CBC: Recent Labs  Lab 04/27/19 0552 04/28/19 0533 04/29/19 0531 04/30/19 0346 05/01/19 0420  WBC 12.2* 13.5* 11.7* 14.7* 23.8*  HGB 10.6* 10.1* 9.4* 9.6* 9.4*  HCT 34.3* 32.7* 30.0* 30.3* 31.0*  MCV 92.5 93.7 92.0 92.4 93.7  PLT 279 300 321 297 326    INR: Recent Labs  Lab 04/27/19 0552 04/28/19 0533 04/29/19 0531 04/30/19 0346 05/01/19 0420  INR 1.8* 1.8* 1.6* 1.4* 1.5*    Other results:    Imaging   No results found.   Medications:     Scheduled Medications: . aspirin EC  81 mg Oral Daily  . atorvastatin  40 mg Oral q1800  . calcitRIOL  0.5 mcg Oral Q M,W,F  . Chlorhexidine Gluconate Cloth  6 each Topical Q0600  . citalopram  20 mg Oral Daily  . erythromycin  250 mg Oral TID  . gabapentin  300 mg Oral BID  . [START ON 05/02/2019] hydrALAZINE  25 mg Oral 3 times per day on Sun Tue Thu Sat  . insulin aspart  0-9 Units Subcutaneous TID WC  . insulin aspart  7 Units Subcutaneous Q supper  . insulin glargine  10 Units Subcutaneous QHS  . LORazepam  0.5 mg Oral BID  . mouth rinse  15 mL Mouth Rinse BID  . metoCLOPramide  10 mg Oral TID WC  . midodrine  5 mg Oral Q M,W,F  . multivitamin  1 tablet Oral QHS  . traMADol  50 mg Oral Q6H    Infusions: . heparin 1,500 Units/hr (05/01/19 0411)    PRN Medications: acetaminophen, docusate sodium, ondansetron (ZOFRAN) IV, oxyCODONE, promethazine, promethazine  Assessment/Plan:    1. PAD/ischemic feet: S/p R transmetatarsal amputation on 8/25.  WBCs up to 24 post-op but afebrile, may be reactive.   He completed vancomycin for ?osteomyelitis on 8/19 (was getting with HD). Cultures NGTD. Left great toe dusky.  - To cath lab tomorrow for left leg angiogram. Will keep off warfarin for now and on heparin gtt.  - Continue oxycodone and ultram for pain. Watch for oversedation (seems ok this morning).  - Currently off abx.  2. Chronic systolic CHF: Ischemic cardiomyopathy. St Jude ICD. NSTEMI in March 2018 with DES to LAD and RCA, complicated by cardiogenic shock, low output requiring milrinone. Echo 8/18 with EF 20-25%, moderate MR. CPX (8/18) with severe functional impairment due to HF. He is s/p HM3 LVAD placement. He had pump thrombosis 6/19 due to [facility] outflow graft kinking. He had pump exchange for another HM3. INR1.5today.  - Volume managed at dialysis, MWF.   - Continue ASA 81and warfarin with INR goal 2-2.5 (warfarin on hold and on heparin pre-surgery). - He has been [facility] for evaluation for heart/kidney transplantand recently saw Dr Mosetta Pigeon =>he is not candidate for transplantnowgiven extensive PAD.  - Consistently elevated MAP on non-HD days.  Will add low dose hydralazine 25 mg tid on non-HD days. 3. ESRD: HD on M-W-F with midodrine pre-HD.  - HD today.  4.  CAD: NSTEMI in 3/18. LHC with 99% ulcerated lesion proximal RCA with left to right collaterals, 95% mid LAD stenosis after mid LAD stent. s/p PCI to RCA and LAD on 11/25/16. No chest pain.  - Continue statin and ASA. 5. h/o DVTs: Factor V Leiden heterozygote. -Anticoagulated. 6. Diabetic gastroparesis: He has seen gastroparesis specialist at [facility]. Surprisingly, gastric emptying study was normal. However, electrogastrogram showed poor gastric accomodation/capacity. Therefore, small frequent meals recommended.Nausea improved now with IV Phenergan.  - Continue reglan 10 mg tid/ac and Marinol.  - Continue Protonix bid.  - He will continue erythromycinindefinitely.  - Limit narcotic use as much as  possible.  6. DM:SSI and home insulin regimen.  7. HTN:As above, will start low dose hydralazine on non-HD days.   I reviewed the LVAD parameters from today, and compared the results to the patient's prior recorded data.  No programming changes were made.  The LVAD is functioning within specified parameters.  The patient performs LVAD self-test daily.  LVAD interrogation was negative for any significant power changes, alarms or PI events/speed drops.  LVAD equipment check completed and is in good working order.  Back-up equipment present.   LVAD education done on emergency procedures and precautions and reviewed exit site care.  Length of Stay: 5  Loralie Champagne, MD 05/01/2019, 7:57  AM  VAD Team --- VAD ISSUES ONLY--- Pager [phone] (7am - 7am)  Advanced Heart Failure Team  Pager [phone] (M-F; 7a - 4p)  Please contact [facility] for night-coverage after hours (4p -7a ) and weekends on [email or web address]

## 2019-05-01 NOTE — Progress Notes (Signed)
ANTICOAGULATION CONSULT NOTE - Follow Up Consult  Pharmacy Consult for heparin Indication: LVAD  Allergies  Allergen Reactions  . Metformin And Related Diarrhea    Patient Measurements: Height: 5' 7.01" (170.2 cm) Weight: 190 lb 0.6 oz (86.2 kg) IBW/kg (Calculated) : 66.12 Heparin Dosing Weight: 83.2 kg  Vital Signs: Temp: 98.3 F (36.8 C) (08/26 0340) Temp Source: Oral (08/26 0340) BP: 135/105 (08/26 0340) Pulse Rate: 86 (08/26 0340)  Labs: Recent Labs    04/29/19 0531  04/30/19 0326 04/30/19 0346 04/30/19 1930 05/01/19 0420  HGB 9.4*  --   --  9.6*  --  9.4*  HCT 30.0*  --   --  30.3*  --  31.0*  PLT 321  --   --  297  --  326  LABPROT 18.6*  --   --  17.3*  --  18.3*  INR 1.6*  --   --  1.4*  --  1.5*  HEPARINUNFRC  --    < > 0.18*  --  0.29* 0.45  CREATININE 11.10*  --   --  7.02*  --  9.14*   < > = values in this interval not displayed.    Estimated Creatinine Clearance: 9.7 mL/min (A) (by C-G formula based on SCr of 9.14 mg/dL (H)).   Assessment: 68 yom w/ hx Heartmate 3 LVAD, factor V Leiden, and PAF - presenting with severe pain (plan for amputation this week). On warfarin PTA - regimen was 5 mg daily except 7.5 mg on Tuesday; however, INR elevated so plan for 2.5 mg on Tues, 5 mg on Wed and Thursday, hold Friday.   INR today is 1.5,  Warfarin is on hold due to plan for amputation of RLE.   Heparin level 0.45, therapeutic, on heparin drip rate 1500 units/hr. Hgb 9.4, plt 326. No s/sx of bleeding. No infusion issues per charting.   Goal of Therapy:  Heparin level 0.3-0.7 units/ml INR 2-2.5 Monitor platelets by anticoagulation protocol: Yes   Plan:  Continue heparin drip at 1500 units/hr  Order heparin level in 8 hours  Plan for procedure on Thursday, continue to hold warfarin at this time Daily heparin level and CBC Monitor bleeding  Antonietta Jewel, PharmD, BCCCP Clinical Pharmacist  Phone: [phone]  Please check AMION for all [place] phone  numbers After 10:00 PM, call [place] [phone] 05/01/2019 7:20 AM

## 2019-05-01 NOTE — Progress Notes (Signed)
[facility]  Subjective  - No new complaints.   Objective (!) 119/93 85 98.3 F (36.8 C) (Oral) 16 100%  Intake/Output Summary (Last 24 hours) at 05/01/2019 0819 Last data filed at 04/30/2019 1700 Gross per 24 hour  Intake 556.72 ml  Output 50 ml  Net 506.72 ml    Right TMA site with clean dry dressing. Active range of motion in right LE intact.  Assessment/Planning: POD # 1 right TMA   plan to change dressing tomorrow  Patient states he is comfortable.  Roxy Horseman 05/01/2019 8:19 AM --  Laboratory Lab Results: Recent Labs    04/30/19 0346 05/01/19 0420  WBC 14.7* 23.8*  HGB 9.6* 9.4*  HCT 30.3* 31.0*  PLT 297 326   BMET Recent Labs    04/30/19 0346 05/01/19 0420  NA 133* 130*  K 3.9 4.9  CL 96* 95*  CO2 27 24  GLUCOSE 95 287*  BUN 24* 41*  CREATININE 7.02* 9.14*  CALCIUM 9.3 9.1    COAG Lab Results  Component Value Date   INR 1.5 (H) 05/01/2019   INR 1.4 (H) 04/30/2019   INR 1.6 (H) 04/29/2019   No results found for: PTT

## 2019-05-01 NOTE — Procedures (Signed)
I was present at this dialysis session. I have reviewed the session itself and made appropriate changes.   2K bath. TDC. 2L UF.  S/p R TMA yesterday with VVS.  Pt s/o current complaints.    Filed Weights   04/30/19 0540 05/01/19 0340 05/01/19 0701  Weight: 86.6 kg 86.2 kg (P) 85.3 kg    Recent Labs  Lab 05/01/19 0420  NA 130*  K 4.9  CL 95*  CO2 24  GLUCOSE 287*  BUN 41*  CREATININE 9.14*  CALCIUM 9.1    Recent Labs  Lab 04/29/19 0531 04/30/19 0346 05/01/19 0420  WBC 11.7* 14.7* 23.8*  HGB 9.4* 9.6* 9.4*  HCT 30.0* 30.3* 31.0*  MCV 92.0 92.4 93.7  PLT 321 297 326    Scheduled Meds: . aspirin EC  81 mg Oral Daily  . atorvastatin  40 mg Oral q1800  . calcitRIOL  0.5 mcg Oral Q M,W,F  . Chlorhexidine Gluconate Cloth  6 each Topical Q0600  . citalopram  20 mg Oral Daily  . erythromycin  250 mg Oral TID  . gabapentin  300 mg Oral BID  . [START ON 05/02/2019] hydrALAZINE  25 mg Oral 3 times per day on Sun Tue Thu Sat  . insulin aspart  0-9 Units Subcutaneous TID WC  . insulin aspart  7 Units Subcutaneous Q supper  . insulin glargine  10 Units Subcutaneous QHS  . LORazepam  0.5 mg Oral BID  . mouth rinse  15 mL Mouth Rinse BID  . metoCLOPramide  10 mg Oral TID WC  . midodrine  5 mg Oral Q M,W,F  . multivitamin  1 tablet Oral QHS  . traMADol  50 mg Oral Q6H   Continuous Infusions: . heparin 1,500 Units/hr (05/01/19 0411)   PRN Meds:.acetaminophen, docusate sodium, ondansetron (ZOFRAN) IV, oxyCODONE, promethazine, promethazine   Pearson Grippe  MD 05/01/2019, 8:36 AM

## 2019-05-02 ENCOUNTER — Encounter (HOSPITAL_COMMUNITY)
Admission: RE | Disposition: A | Discharge status: Home Health | Payer: Self-pay | Source: Home / Self Care | Attending: Cardiology

## 2019-05-02 ENCOUNTER — Encounter (HOSPITAL_COMMUNITY): Payer: Self-pay | Admitting: Vascular Surgery

## 2019-05-02 ENCOUNTER — Inpatient Hospital Stay (HOSPITAL_COMMUNITY): Payer: BC Managed Care – PPO

## 2019-05-02 HISTORY — PX: PERIPHERAL VASCULAR INTERVENTION: CATH118257

## 2019-05-02 HISTORY — PX: LOWER EXTREMITY ANGIOGRAPHY: CATH118251

## 2019-05-02 LAB — HEPARIN LEVEL (UNFRACTIONATED): Heparin Unfractionated: 0.36 IU/mL (ref 0.30–0.70)

## 2019-05-02 LAB — CBC
HCT: 29.9 % — ABNORMAL LOW (ref 39.0–52.0)
Hemoglobin: 9.3 g/dL — ABNORMAL LOW (ref 13.0–17.0)
MCH: 28.7 pg (ref 26.0–34.0)
MCHC: 31.1 g/dL (ref 30.0–36.0)
MCV: 92.3 fL (ref 80.0–100.0)
Platelets: 338 10*3/uL (ref 150–400)
RBC: 3.24 MIL/uL — ABNORMAL LOW (ref 4.22–5.81)
RDW: 16.3 % — ABNORMAL HIGH (ref 11.5–15.5)
WBC: 27.6 10*3/uL — ABNORMAL HIGH (ref 4.0–10.5)
nRBC: 0.4 % — ABNORMAL HIGH (ref 0.0–0.2)

## 2019-05-02 LAB — CULTURE, BLOOD (ROUTINE X 2)
Culture: NO GROWTH
Culture: NO GROWTH

## 2019-05-02 LAB — GLUCOSE, CAPILLARY
Glucose-Capillary: 115 mg/dL — ABNORMAL HIGH (ref 70–99)
Glucose-Capillary: 117 mg/dL — ABNORMAL HIGH (ref 70–99)
Glucose-Capillary: 154 mg/dL — ABNORMAL HIGH (ref 70–99)
Glucose-Capillary: 161 mg/dL — ABNORMAL HIGH (ref 70–99)

## 2019-05-02 LAB — POCT ACTIVATED CLOTTING TIME: Activated Clotting Time: 257 seconds

## 2019-05-02 LAB — PROTIME-INR
INR: 1.4 — ABNORMAL HIGH (ref 0.8–1.2)
Prothrombin Time: 17.2 seconds — ABNORMAL HIGH (ref 11.4–15.2)

## 2019-05-02 LAB — LACTATE DEHYDROGENASE: LDH: 148 U/L (ref 98–192)

## 2019-05-02 SURGERY — LOWER EXTREMITY ANGIOGRAPHY
Anesthesia: LOCAL

## 2019-05-02 MED ORDER — MIDAZOLAM HCL 2 MG/2ML IJ SOLN
INTRAMUSCULAR | Status: DC | PRN
  Administered 2019-05-02: 1 mg via INTRAVENOUS

## 2019-05-02 MED ORDER — HEPARIN (PORCINE) 25000 UT/250ML-% IV SOLN
1600.0000 [IU]/h | INTRAVENOUS | Status: DC
  Administered 2019-05-02 – 2019-05-03 (×2): 1500 [IU]/h via INTRAVENOUS
  Administered 2019-05-04 – 2019-05-05 (×3): 1600 [IU]/h via INTRAVENOUS
  Filled 2019-05-02 (×5): qty 250

## 2019-05-02 MED ORDER — LABETALOL HCL 5 MG/ML IV SOLN: 10.0000 mg | INTRAVENOUS | Status: DC | PRN

## 2019-05-02 MED ORDER — ONDANSETRON HCL 4 MG/2ML IJ SOLN: 4.0000 mg | Freq: Four times a day (QID) | INTRAMUSCULAR | Status: DC | PRN

## 2019-05-02 MED ORDER — SODIUM CHLORIDE 0.9% FLUSH: 3.0000 mL | INTRAVENOUS | Status: DC | PRN

## 2019-05-02 MED ORDER — SODIUM CHLORIDE 0.9 % IV SOLN
INTRAVENOUS | Status: DC
  Administered 2019-05-02: 07:00:00 via INTRAVENOUS

## 2019-05-02 MED ORDER — FENTANYL CITRATE (PF) 100 MCG/2ML IJ SOLN
INTRAMUSCULAR | Status: AC
  Filled 2019-05-02: qty 2

## 2019-05-02 MED ORDER — WARFARIN SODIUM 7.5 MG PO TABS
7.5000 mg | ORAL_TABLET | Freq: Once | ORAL | Status: AC
  Administered 2019-05-02: 7.5 mg via ORAL
  Filled 2019-05-02: qty 1

## 2019-05-02 MED ORDER — HEPARIN (PORCINE) IN NACL 1000-0.9 UT/500ML-% IV SOLN
INTRAVENOUS | Status: DC | PRN
  Administered 2019-05-02: 500 mL

## 2019-05-02 MED ORDER — SODIUM CHLORIDE 0.9% FLUSH
3.0000 mL | Freq: Two times a day (BID) | INTRAVENOUS | Status: DC
  Administered 2019-05-02 – 2019-05-13 (×18): 3 mL via INTRAVENOUS

## 2019-05-02 MED ORDER — HEPARIN SODIUM (PORCINE) 1000 UNIT/ML IJ SOLN
INTRAMUSCULAR | Status: DC | PRN
  Administered 2019-05-02: 8000 [IU] via INTRAVENOUS
  Administered 2019-05-03: 11:00:00 3400 [IU]

## 2019-05-02 MED ORDER — SODIUM CHLORIDE 0.9 % IV SOLN: 250.0000 mL | INTRAVENOUS | Status: DC | PRN

## 2019-05-02 MED ORDER — HEPARIN (PORCINE) IN NACL 1000-0.9 UT/500ML-% IV SOLN
INTRAVENOUS | Status: AC
  Filled 2019-05-02: qty 1000

## 2019-05-02 MED ORDER — IODIXANOL 320 MG/ML IV SOLN
INTRAVENOUS | Status: DC | PRN
  Administered 2019-05-02: 50 mL via INTRA_ARTERIAL

## 2019-05-02 MED ORDER — ACETAMINOPHEN 325 MG PO TABS: 650.0000 mg | ORAL_TABLET | ORAL | Status: DC | PRN

## 2019-05-02 MED ORDER — FENTANYL CITRATE (PF) 100 MCG/2ML IJ SOLN
INTRAMUSCULAR | Status: DC | PRN
  Administered 2019-05-02: 25 ug via INTRAVENOUS

## 2019-05-02 MED ORDER — NITROGLYCERIN 1 MG/10 ML FOR IR/CATH LAB
INTRA_ARTERIAL | Status: AC
  Filled 2019-05-02: qty 10

## 2019-05-02 MED ORDER — LIDOCAINE HCL (PF) 1 % IJ SOLN
INTRAMUSCULAR | Status: AC
  Filled 2019-05-02: qty 30

## 2019-05-02 MED ORDER — MIDAZOLAM HCL 2 MG/2ML IJ SOLN
INTRAMUSCULAR | Status: AC
  Filled 2019-05-02: qty 2

## 2019-05-02 MED ORDER — HYDRALAZINE HCL 20 MG/ML IJ SOLN: 5.0000 mg | INTRAMUSCULAR | Status: DC | PRN

## 2019-05-02 MED ORDER — LIDOCAINE HCL (PF) 1 % IJ SOLN
INTRAMUSCULAR | Status: DC | PRN
  Administered 2019-05-02: 15 mL

## 2019-05-02 MED ORDER — WARFARIN - PHARMACIST DOSING INPATIENT
Freq: Every day | Status: DC
  Administered 2019-05-04 – 2019-05-10 (×5)

## 2019-05-02 SURGICAL SUPPLY — 22 items
BALLN MUSTANG 5X60X135 (BALLOONS) ×3
BALLN STERLING OTW 3X220X150 (BALLOONS) ×3
BALLOON MUSTANG 5X60X135 (BALLOONS) IMPLANT
BALLOON STERLING OTW 3X220X150 (BALLOONS) IMPLANT
CATH CXI SUPP 2.6F 150 ANG (CATHETERS) ×1 IMPLANT
CATH OMNI FLUSH 5F 65CM (CATHETERS) ×1 IMPLANT
CLOSURE MYNX CONTROL 6F/7F (Vascular Products) ×1 IMPLANT
GLIDEWIRE ADV .035X260CM (WIRE) ×1 IMPLANT
KIT ENCORE 26 ADVANTAGE (KITS) ×1 IMPLANT
KIT MICROPUNCTURE NIT STIFF (SHEATH) ×1 IMPLANT
KIT PV (KITS) ×3 IMPLANT
SHEATH HIGHFLEX ANSEL 6FRX55 (SHEATH) ×1 IMPLANT
SHEATH PINNACLE 5F 10CM (SHEATH) ×1 IMPLANT
SHEATH PINNACLE 6F 10CM (SHEATH) ×1 IMPLANT
SHEATH PROBE COVER 6X72 (BAG) ×1 IMPLANT
STENT ELUVIA 6X60X130 (Permanent Stent) ×1 IMPLANT
SYR MEDRAD MARK V 150ML (SYRINGE) ×1 IMPLANT
TRANSDUCER W/STOPCOCK (MISCELLANEOUS) ×3 IMPLANT
TRAY PV CATH (CUSTOM PROCEDURE TRAY) ×3 IMPLANT
WIRE BENTSON .035X145CM (WIRE) ×1 IMPLANT
WIRE G V18X300CM (WIRE) ×2 IMPLANT
WIRE HI TORQ COMMND ES.014X300 (WIRE) ×1 IMPLANT

## 2019-05-02 NOTE — Progress Notes (Signed)
s/p right metatarsal amputation after revascularization and failed toe amputation.  Plan will be for left lower extremity revascularization today.

## 2019-05-02 NOTE — Progress Notes (Signed)
ANTICOAGULATION CONSULT NOTE - Follow Up Consult  Pharmacy Consult for heparin Indication: LVAD  Allergies  Allergen Reactions  . Metformin And Related Diarrhea    Patient Measurements: Height: 5' 7.01" (170.2 cm) Weight: 190 lb 0.6 oz (86.2 kg) IBW/kg (Calculated) : 66.12 Heparin Dosing Weight: 83.2 kg  Vital Signs: Temp: 98.4 F (36.9 C) (08/27 0300) Temp Source: Oral (08/27 0300) BP: 130/102 (08/27 0842) Pulse Rate: 85 (08/27 0842)  Labs: Recent Labs    04/30/19 0346  05/01/19 0420 05/01/19 1700 05/02/19 0418  HGB 9.6*  --  9.4*  --  9.3*  HCT 30.3*  --  31.0*  --  29.9*  PLT 297  --  326  --  338  LABPROT 17.3*  --  18.3*  --  17.2*  INR 1.4*  --  1.5*  --  1.4*  HEPARINUNFRC  --    < > 0.45 0.36 0.36  CREATININE 7.02*  --  9.14*  --   --    < > = values in this interval not displayed.    Estimated Creatinine Clearance: 9.7 mL/min (A) (by C-G formula based on SCr of 9.14 mg/dL (H)).   Assessment: 10 yom w/ hx Heartmate 3 LVAD, factor V Leiden, and PAF - presenting with severe pain (plan for amputation this week). On warfarin PTA - regimen was 5 mg daily except 7.5 mg on Tuesday; however, INR elevated on 8/18 so plan was for 2.5 mg on Tues, 5 mg on Wed and Thursday, hold Friday.   Underwent arteriogram with placement of L SFA stent on 8/27 - plan to restart heparin 8 hours after sheath removal (removed ~0900).   INR today is 1.4 today- warfarin on hold all admission. Heparin level 0.36, therapeutic, on heparin drip rate 1500 units/hr. No s/sx of bleeding. No infusion issues per charting.   Goal of Therapy:  Heparin level 0.3-0.7 units/ml INR 2-2.5 Monitor platelets by anticoagulation protocol: Yes   Plan:  Restart heparin drip at 1500 units/hr on 8/27 at 1700 Restart warfarin tonight at 7.5 mg tonight  Daily heparin level and CBC Monitor bleeding  Antonietta Jewel, PharmD, [place] Pharmacist  Phone: [phone]  Please check AMION for all [place] phone numbers After 10:00 PM, call [place] [phone] 05/02/2019 9:22 AM

## 2019-05-02 NOTE — Progress Notes (Signed)
Orthopedic Tech Progress Note Patient Details:  [name] 12-02-1965 [id number]  Ortho Devices Type of Ortho Device: Darco shoe Ortho Device/Splint Location: RLE Ortho Device/Splint Interventions: Ordered   Post Interventions Patient Tolerated: Well Instructions Provided: Care of device   Braulio Bosch 05/02/2019, 6:02 PM

## 2019-05-02 NOTE — Progress Notes (Addendum)
LVAD Coordinator Procedure Note: Met patient in CV lab, [place] nurse with patient.  Pt lying on lab table, draped, sleeping, rouses to verbal stimuli, denies any discomfort. Pt is having a left lower extremity angiography per Dr. Carlis Abbott.  Patient tolerated the procedure well. VAD Coordinator remained with patient throughout the procedure and accompanied patient back to [facility].  Daily Note:   Vital signs: Temp:  98 HR:  85 Doppler Pressure: not done Auto BP:  117/101 (107) O2 Sat: 100% on RA Wt: 186>192>187>190>188>190 lbs  LVAD interrogation reveals:  Speed: 5500 Flow: 3.8 Power: 4.4w PI: 5.8 Alarms: none Events: none Hematocrit: 30  Fixed speed: 5500 Low speed limit: 5200  Drive Line:  CDI., anchor intact and accurately applied. Weekly dressing kits. Next dressing change due 05/05/19, bedside nurse to change.   Labs: LDH trend: 195>177>170>170>168>148  INR trend: 1.6>1.8>1.8>1.6>1.4>1.5>1.4  WBC trend: 11.7>14.7>23.8>27.6  Infection:  Blood cx 08/22>> no growth - final  Anticoagulation Plan: - INR Goal: 2.0 - 3.0 - ASA Dose: 81 mg daily   Device: -St Jude dual  -Therapies: on 200 bpm - Monitor: on VT 150 bpm  Adverse Events on VAD: - ESRD post LVAD; started CVVH 06/15/17; HD started 06/20/17 - Hospitalization 11/5 - 07/16/17 > gastroparesis diagnosis after EGD - Hospitalization 11/26 - 08/04/17 > gastroparesis - Hospitalization 1/8 - 09/16/17>gastroparesis - referred to Dr. Corliss Parish at [facility] - 10/31/17>rightupper arm arteriovenous graft with Artegraft; ligation of right first stage brachial vein transposition  - 12/11/13 elective admission for thrombectomy for AV graft in RUA  - 12/20/17 > intractable N/V - 01/19/18 > intractable N/V sent by EMS from dialysis center - 03/01/18 > outflow graft twist/occulsion requiring pump exchange - 05/23/18 > admitted for non-working fistula in RUA - 06/10/18 > admitted for intractable N/V - 08/08/18 > admitted for peri-op management for 2nd  stage of AV fistula, found to be sclerotic. New LUE graft created from brachial artery to axillary vein. Developed spontaneous LUE hematoma.  - 08/22/18 > admitted for intractable N/V - 08/28/18> admitted for intractable N/V/D and low INR - Admitted 7/10-7/17> admitted for right foot ischemia- 4th toe amputation per Dr Donzetta Matters  - Admitted 03/26/29> admitted for N/V severe foot pain  - Admitted 04/25/19> admitted for right foot amputation vs BKA   Plan/Recommendations:  1. Please page VAD coordinator with any equipment questions or concerns.    Zada Girt RN [place] Coordinator  Office: [phone]  24/7 Pager: [phone]

## 2019-05-02 NOTE — Op Note (Signed)
Patient name: [name] MRN: [id number] DOB: 01-08-1966 Sex: male  04/26/2019 - 05/02/2019 Pre-operative Diagnosis: Critical limb ischemia left lower extremity with tissue loss Post-operative diagnosis:  Same Surgeon:  Marty Heck, MD Procedure Performed: 1.  Ultrasound-guided access of the right common femoral artery 2.  Left lower extremity arteriogram with selection of third order branches 3.  Left SFA stent (6 mm x 60 mm drug-coated Eluvia stent postdilated with a 5 mm Mustang) 4.  Mynx closure of the right common femoral artery 5.  52 minutes of monitored moderate conscious sedation time  Indications: Patient is a 53 year old male who has extensive medical history that presented with tissue loss in his lower extremities.  He underwent right lower extremity intervention with Dr. Donzetta Matters earlier in the week.  He presents today for left lower extremity intervention in the setting of SFA stenosis as well as single-vessel peroneal diseased runoff.  Findings: Aortogram was deferred since it was performed earlier in the week.  Dedicated left lower extremity arteriogram showed approximate 50% stenosis in the mid SFA.  Patient only had single runoff via the peroneal artery that appeared inline down to the foot but was heavily diseased throughout multiple segments with heavily calcified multifocal high-grade stenosis and/or short occlusions.  Ultimately cannulated the left peroneal with a V 18 and attempted to get down to the ankle.  Unfortunately in the mid calf I could not get my wire down the true lumen of the vessel and was in a subintimal plane and also had a small perforation.  I tried multiple wires and catheters including an 014 and still could not get down the true lumen of the artery distal to this lesion.  Possible short focal occlusion.  Ultimately elected to stop given single-vessel runoff.  The left SFA was then treated with a 6 mm x 60 mm drug-coated stent and postdilated  with a 5 mm balloon.  He had preserved single-vessel peroneal runoff at the end of the case.   Procedure:  The patient was identified in the holding area and taken to room 8.  The patient was then placed supine on the table and prepped and draped in the usual sterile fashion.  A time out was called.  Ultrasound was used to evaluate the right common femoral artery.  It was patent .  A digital ultrasound image was acquired.  A micropuncture needle was used to access the right common femoral artery under ultrasound guidance.  An 018 wire was advanced without resistance and a micropuncture sheath was placed.  The 018 wire was removed and a benson wire was placed.  The micropuncture sheath was exchanged for a 5 french sheath.  An omniflush catheter was advanced over the wire to the level of L-1.  Next, using the omniflush catheter and a benson wire, the aortic bifurcation was crossed and the catheter was placed into theleft external iliac artery and left runoff was obtained.  Pertinent findings are noted above.  Ultimately elected to attempt intervention.  A Glidewire advantage was then used to exchange for a long 6 [place] Ansell sheath in the right groin over the aortic bifurcation.  Patient was given 100 units/kg heparin.  Then used a V 18 wire and CXI catheter and selected the peroneal artery.  The artery looked very diseased but inline.  Unfortunately my wire kept meeting resistance about midway down the calf in the peroneal and with multiple manipulations including different catheters and wires I could not get the  wire to pass this segment into the true lumen of the peroneal distally.  I was subintimal.  Also had a perforation here.  Ultimately after fairly lengthy attempt to cross the lesion here I elected to stop given single-vessel peroneal runoff.  I exchanged back for my Glidewire advantage.  The SFA lesion was then primarily stented with a 6 x 60 Eluvia and postdilated with a 5 mm Mustang.  He had preserved  peroneal runoff and no residual stenosis in the SFA.  Exchanged for a short 6 French sheath in the right groin.  Wires and catheters were removed.  A dedicated right femoral arteriogram was obtained.  Mynx closure was deployed in the right common femoral artery.     Marty Heck, MD [facility] Office: [phone] Pager: [place]

## 2019-05-02 NOTE — Progress Notes (Signed)
Admit: 04/26/2019 LOS: 6  65M ESRD w/ LVAD and ischemic R foot for amputation  Subjective:  . LLE angiography today, rec stent . Had TMA RLE 8/25 . HD yesterday, no issues 2L UF; post weight 83.9kg . Pt w/o complaint  08/26 0701 - 08/27 0700 In: 746.2 [P.O.:240; I.V.:506.2] Out: 2000   Filed Weights   05/01/19 0701 05/01/19 1117 05/02/19 0648  Weight: 85.3 kg 83.9 kg 86.2 kg    Scheduled Meds: . aspirin EC  81 mg Oral Daily  . atorvastatin  40 mg Oral q1800  . calcitRIOL  0.5 mcg Oral Q M,W,F  . Chlorhexidine Gluconate Cloth  6 each Topical Q0600  . citalopram  20 mg Oral Daily  . erythromycin  250 mg Oral TID  . gabapentin  300 mg Oral BID  . hydrALAZINE  25 mg Oral 3 times per day on Sun Tue Thu Sat  . insulin aspart  0-9 Units Subcutaneous TID WC  . insulin aspart  7 Units Subcutaneous Q supper  . insulin glargine  10 Units Subcutaneous QHS  . LORazepam  0.5 mg Oral BID  . mouth rinse  15 mL Mouth Rinse BID  . metoCLOPramide  10 mg Oral TID WC  . midodrine  5 mg Oral Q M,W,F  . multivitamin  1 tablet Oral QHS  . sodium chloride flush  3 mL Intravenous Q12H  . traMADol  50 mg Oral Q6H   Continuous Infusions: . sodium chloride 10 mL/hr at 05/02/19 0636  . sodium chloride    . heparin     PRN Meds:.sodium chloride, acetaminophen, docusate sodium, hydrALAZINE, labetalol, ondansetron (ZOFRAN) IV, oxyCODONE, promethazine, promethazine, sodium chloride flush  Current Labs: reviewed   Physical Exam:  Blood pressure (!) 130/102, pulse 85, temperature 98 F (36.7 C), temperature source Oral, resp. rate 10, height 5' 7.01" (1.702 m), weight 86.2 kg, SpO2 100 %. NAD COnt hum from LVAD RRR NO LEE R TMA bandaged AAO x3 TDC L IJ  Dialysis Orders:  MWF at [facility] 4:15hr, 400/800, EDW 84kg, 2K/3Ca, TDC, no heparin - Hectoral 56mg IV q HD - Mircera 2051m q 2 weeks (last 8/18) - Venofer 5051mV weekly  A 1.  R foot ischemia/dry gangrene + ischemic L foot: s/p R TMA 8/25  and angio with stent of LLE 8/27.   2. ESRD: Continue HD per MWF schedule tomorrow 2K No hep, goal UF 3L, watch post weights 3.  Hypertension/volume: BP controlled, euvolemic at this time. 4.  Anemia:  Hgb 9.6 - not due for ESA yet. Anticipate post-op drop, transfuse prn. 5.  Metabolic bone disease: Ca OK, 6.  CAD/ICM/LVAD: Per HF team 7.  Type 2 DM with known severe gastroparesis  RyaPearson Grippe 05/02/2019, 10:47 AM  Recent Labs  Lab 04/29/19 0531 04/30/19 0346 05/01/19 0420  NA 131* 133* 130*  K 4.1 3.9 4.9  CL 88* 96* 95*  CO2 25 27 24   GLUCOSE 121* 95 287*  BUN 42* 24* 41*  CREATININE 11.10* 7.02* 9.14*  CALCIUM 8.6* 9.3 9.1   Recent Labs  Lab 04/30/19 0346 05/01/19 0420 05/02/19 0418  WBC 14.7* 23.8* 27.6*  HGB 9.6* 9.4* 9.3*  HCT 30.3* 31.0* 29.9*  MCV 92.4 93.7 92.3  PLT 297 326 338

## 2019-05-02 NOTE — Plan of Care (Signed)
  Problem: Cardiac: Goal: LVAD will function as expected and patient will experience no clinical alarms Outcome: Progressing   Problem: Health Behavior/Discharge Planning: Goal: Ability to manage health-related needs will improve Outcome: Progressing   Problem: Clinical Measurements: Goal: Ability to maintain clinical measurements within normal limits will improve Outcome: Progressing Goal: Will remain free from infection Outcome: Progressing Goal: Diagnostic test results will improve Outcome: Progressing Goal: Respiratory complications will improve Outcome: Progressing Goal: Cardiovascular complication will be avoided Outcome: Progressing   Problem: Activity: Goal: Risk for activity intolerance will decrease Outcome: Progressing   Problem: Nutrition: Goal: Adequate nutrition will be maintained Outcome: Progressing   Problem: Coping: Goal: Level of anxiety will decrease Outcome: Progressing   Problem: Elimination: Goal: Will not experience complications related to bowel motility Outcome: Progressing Goal: Will not experience complications related to urinary retention Outcome: Progressing   Problem: Pain Managment: Goal: General experience of comfort will improve Outcome: Progressing   Problem: Safety: Goal: Ability to remain free from injury will improve Outcome: Progressing   Problem: Skin Integrity: Goal: Risk for impaired skin integrity will decrease Outcome: Progressing

## 2019-05-02 NOTE — Progress Notes (Signed)
Patient ID: [name], male   DOB: 1965-10-17, 53 y.o.   MRN: [id number]   Advanced Heart Failure VAD Team Note  PCP-Cardiologist: No primary care provider on file.   Subjective:    Admitted for ischemic foot pain with need for amputation => transmetatarsal amputation right foot on 8/25.   Left leg angiogram today: DES to left SFA.  1 vessel runoff via peroneal, severe disease with multilevel stenosis but unable to intervene.   Minimal pain right foot.   LVAD INTERROGATION:  HeartMate 3 LVAD:   Flow 4.1 L  liters/min, speed 5500, power 4.4, PI 4.2.  No PI events.       Objective:    Vital Signs:   Temp:  [97.8 F (36.6 C)-98.7 F (37.1 C)] 97.8 F (36.6 C) (08/27 1100) Pulse Rate:  [73-90] 80 (08/27 1100) Resp:  [8-28] 9 (08/27 1211) BP: (108-135)/(67-105) 119/86 (08/27 1211) SpO2:  [94 %-100 %] 94 % (08/27 1100) Weight:  [86.2 kg] 86.2 kg (08/27 0648) Last BM Date: 04/25/19 Mean arterial Pressure 90s  Intake/Output:   Intake/Output Summary (Last 24 hours) at 05/02/2019 1334 Last data filed at 05/02/2019 1300 Gross per 24 hour  Intake 539.38 ml  Output -  Net 539.38 ml     Physical Exam    General: Well appearing this am. NAD.  HEENT: Normal. Neck: Supple, JVP 7-8 cm. Carotids OK.  Cardiac:  Mechanical heart sounds with LVAD hum present.  Lungs:  CTAB, normal effort.  Abdomen:  NT, ND, no HSM. No bruits or masses. +BS  LVAD exit site: Well-healed and incorporated. Dressing dry and intact. No erythema or drainage. Stabilization device present and accurately applied. Driveline dressing changed daily per sterile technique. Extremities:  S/p right foot TMA.  Neuro:  Alert & oriented x 3. Cranial nerves grossly intact. Moves all 4 extremities w/o difficulty. Affect pleasant     Telemetry   Sinus 90s Personally reviewed   Labs   Basic Metabolic Panel: Recent Labs  Lab 04/27/19 0552 04/28/19 0533 04/29/19 0531 04/30/19 0346 05/01/19 0420  NA 133*  131* 131* 133* 130*  K 4.2 4.3 4.1 3.9 4.9  CL 95* 93* 88* 96* 95*  CO2 28 26 25 27 24   GLUCOSE 71 78 121* 95 287*  BUN 18 31* 42* 24* 41*  CREATININE 5.84* 8.38* 11.10* 7.02* 9.14*  CALCIUM 8.8* 8.8* 8.6* 9.3 9.1    Liver Function Tests: Recent Labs  Lab 04/30/19 0346  AST 15  ALT 12  ALKPHOS 86  BILITOT 0.6  PROT 6.4*  ALBUMIN 2.6*   No results for input(s): LIPASE, AMYLASE in the last 168 hours. No results for input(s): AMMONIA in the last 168 hours.  CBC: Recent Labs  Lab 04/28/19 0533 04/29/19 0531 04/30/19 0346 05/01/19 0420 05/02/19 0418  WBC 13.5* 11.7* 14.7* 23.8* 27.6*  HGB 10.1* 9.4* 9.6* 9.4* 9.3*  HCT 32.7* 30.0* 30.3* 31.0* 29.9*  MCV 93.7 92.0 92.4 93.7 92.3  PLT 300 321 297 326 338    INR: Recent Labs  Lab 04/28/19 0533 04/29/19 0531 04/30/19 0346 05/01/19 0420 05/02/19 0418  INR 1.8* 1.6* 1.4* 1.5* 1.4*    Other results:    Imaging   Dg Chest Port 1 View  Result Date: 05/02/2019 CLINICAL DATA:  Preop chest exam.  Coronary artery disease. EXAM: PORTABLE CHEST 1 VIEW COMPARISON:  Radiographs of April 26, 2019. FINDINGS: Stable cardiomegaly. Left ventricular assist device is unchanged in position. Left-sided pacemaker is unchanged in position.  Left internal jugular dialysis catheter is unchanged in position. Right internal jugular catheter is stable. No pneumothorax is noted. Left lung is clear. Elevated right hemidiaphragm is noted with mild right basilar subsegmental atelectasis. Bony thorax is unremarkable. IMPRESSION: Stable support apparatus. Stable elevated right hemidiaphragm is noted with mild right basilar subsegmental atelectasis. Electronically Signed   By: Marijo Conception M.D.   On: 05/02/2019 07:14     Medications:     Scheduled Medications: . aspirin EC  81 mg Oral Daily  . atorvastatin  40 mg Oral q1800  . calcitRIOL  0.5 mcg Oral Q M,W,F  . Chlorhexidine Gluconate Cloth  6 each Topical Q0600  . citalopram  20 mg Oral  Daily  . erythromycin  250 mg Oral TID  . gabapentin  300 mg Oral BID  . hydrALAZINE  25 mg Oral 3 times per day on Sun Tue Thu Sat  . insulin aspart  0-9 Units Subcutaneous TID WC  . insulin aspart  7 Units Subcutaneous Q supper  . insulin glargine  10 Units Subcutaneous QHS  . LORazepam  0.5 mg Oral BID  . mouth rinse  15 mL Mouth Rinse BID  . metoCLOPramide  10 mg Oral TID WC  . midodrine  5 mg Oral Q M,W,F  . multivitamin  1 tablet Oral QHS  . sodium chloride flush  3 mL Intravenous Q12H  . traMADol  50 mg Oral Q6H    Infusions: . sodium chloride 10 mL/hr at 05/02/19 0636  . sodium chloride    . heparin      PRN Medications: sodium chloride, acetaminophen, docusate sodium, hydrALAZINE, labetalol, ondansetron (ZOFRAN) IV, oxyCODONE, promethazine, promethazine, sodium chloride flush  Assessment/Plan:    1. PAD/ischemic feet: S/p R transmetatarsal amputation on 8/25.  WBCs up to 27 post-op but afebrile, may be reactive.  He completed vancomycin for ?osteomyelitis on 8/19 (was getting with HD). Prior cultures NGTD. Left great toe dusky, now s/p left leg angiogram with SFA PCI.  There was 1 vessel runoff via left peroneal with severe multilevel stenosis => unable to intervene on peroneal.  - Will reculture today given rise in WBCs.  - Currently off abx.  2. Chronic systolic CHF: Ischemic cardiomyopathy. St Jude ICD. NSTEMI in March 2018 with DES to LAD and RCA, complicated by cardiogenic shock, low output requiring milrinone. Echo 8/18 with EF 20-25%, moderate MR. CPX (8/18) with severe functional impairment due to HF. He is s/p HM3 LVAD placement. He had pump thrombosis 6/19 due to [facility] outflow graft kinking. He had pump exchange for another HM3. INR1.4today.  - Volume managed at dialysis, MWF.   - Continue ASA 81and warfarin with INR goal 2-2.5 (restart heparin gtt 8 hrs after angiogram and restart warfarin today). - He has been referred to [facility] for evaluation for  heart/kidney transplantand recently saw Dr Mosetta Pigeon =>he is not candidate for transplantnowgiven extensive PAD.  - Consistently elevated MAP on non-HD days.  Will add low dose hydralazine 25 mg tid on non-HD days. 3. ESRD: HD on M-W-F with midodrine pre-HD.  - HD today.  4.  CAD: NSTEMI in 3/18. LHC with 99% ulcerated lesion proximal RCA with left to right collaterals, 95% mid LAD stenosis after mid LAD stent. s/p PCI to RCA and LAD on 11/25/16. No chest pain.  - Continue statin and ASA. 5. h/o DVTs: Factor V Leiden heterozygote. -Anticoagulated. 6. Diabetic gastroparesis: He has seen gastroparesis specialist at [facility]. Surprisingly, gastric emptying study was normal.  However, electrogastrogram showed poor gastric accomodation/capacity. Therefore, small frequent meals recommended.Nausea improved now with IV Phenergan.  - Continue reglan 10 mg tid/ac and Marinol.  - Continue Protonix bid.  - He will continue erythromycinindefinitely.  - Limit narcotic use as much as possible.  6. DM:SSI and home insulin regimen.  7. XLE:[id number] hydralazine on non-HD days.  8. He will need PT, will probably need stay in CIR before he goes home.   I reviewed the LVAD parameters from today, and compared the results to the patient's prior recorded data.  No programming changes were made.  The LVAD is functioning within specified parameters.  The patient performs LVAD self-test daily.  LVAD interrogation was negative for any significant power changes, alarms or PI events/speed drops.  LVAD equipment check completed and is in good working order.  Back-up equipment present.   LVAD education done on emergency procedures and precautions and reviewed exit site care.  Length of Stay: 6  Loralie Champagne, MD 05/02/2019, 1:34 PM  VAD Team --- VAD ISSUES ONLY--- Pager [phone] (7am - 7am)  Advanced Heart Failure Team  Pager [phone] (M-F; 7a - 4p)  Please contact [facility] for night-coverage after  hours (4p -7a ) and weekends on [email or web address]

## 2019-05-02 NOTE — Progress Notes (Signed)
  Progress Note    05/02/2019 7:42 AM 1 Day Post-Op  Subjective:  No overnight complaints  Vitals:   05/02/19 0300 05/02/19 0735  BP:    Pulse:    Resp:    Temp: 98.4 F (36.9 C)   SpO2:  100%    Physical Exam: Awake alert oriented  Right lower extremity is warm Right foot dressing clean dry intact Stable left great toe gangrenous changes  CBC    Component Value Date/Time   WBC 27.6 (H) 05/02/2019 0418   RBC 3.24 (L) 05/02/2019 0418   HGB 9.3 (L) 05/02/2019 0418   HGB 11.7 (L) 09/07/2016 1457   HCT 29.9 (L) 05/02/2019 0418   HCT 37.5 09/07/2016 1457   PLT 338 05/02/2019 0418   PLT 279 09/07/2016 1457   MCV 92.3 05/02/2019 0418   MCV 81 09/07/2016 1457   MCH 28.7 05/02/2019 0418   MCHC 31.1 05/02/2019 0418   RDW 16.3 (H) 05/02/2019 0418   RDW 14.9 09/07/2016 1457   LYMPHSABS 1.2 04/01/2019 1824   LYMPHSABS 1.3 09/07/2016 1457   MONOABS 1.3 (H) 04/01/2019 1824   EOSABS 0.4 04/01/2019 1824   EOSABS 0.2 09/07/2016 1457   BASOSABS 0.0 04/01/2019 1824   BASOSABS 0.0 09/07/2016 1457    BMET    Component Value Date/Time   NA 130 (L) 05/01/2019 0420   NA 144 05/11/2017 0938   K 4.9 05/01/2019 0420   CL 95 (L) 05/01/2019 0420   CO2 24 05/01/2019 0420   GLUCOSE 287 (H) 05/01/2019 0420   BUN 41 (H) 05/01/2019 0420   BUN 68 (H) 05/11/2017 0938   CREATININE 9.14 (H) 05/01/2019 0420   CREATININE 1.63 (H) 11/09/2016 1505   CALCIUM 9.1 05/01/2019 0420   CALCIUM 8.7 06/27/2017 1152   GFRNONAA 6 (L) 05/01/2019 0420   GFRNONAA 48 (L) 11/09/2016 1505   GFRAA 7 (L) 05/01/2019 0420   GFRAA 55 (L) 11/09/2016 1505    INR    Component Value Date/Time   INR 1.4 (H) 05/02/2019 0418     Intake/Output Summary (Last 24 hours) at 05/02/2019 0742 Last data filed at 05/02/2019 0300 Gross per 24 hour  Intake 746.15 ml  Output 2000 ml  Net -1253.85 ml     Assessment:  53 y.o. male is s/p right metatarsal amputation after revascularization and failed toe amputation.   Plan will be for left lower extremity revascularization today.    Marty Heck, MD [facility] Office: [phone] Pager: [phone]   05/02/2019 7:42 AM

## 2019-05-02 NOTE — TOC Initial Note (Signed)
Transition of Care [facility]) - Initial/Assessment Note    Patient Details  Name: [name] MRN: [id number] Date of Birth: Feb 13, 1966  Transition of Care [facility]) CM/SW Contact:    Zenon Mayo, RN Phone Number: 05/02/2019, 9:19 AM  Clinical Narrative:                 From home with daughter, LVAD patient, dialysis MWF,  states she has no issues in getting his meds.  s/p right metatarsal amputation after revascularization and failed toe amputation. Plan will be for left lower extremity revascularization today.  Expected Discharge Plan: [place] Barriers to Discharge: No Barriers Identified   Patient Goals and CMS Choice Patient states their goals for this hospitalization and ongoing recovery are:: recover   Choice offered to / list presented to : NA  Expected Discharge Plan and Services Expected Discharge Plan: [place] In-house Referral: NA Discharge Planning Services: CM Consult Post Acute Care Choice: NA Living arrangements for the past 2 months: Single Family Home                 DME Arranged: (NA)         HH Arranged: NA          Prior Living Arrangements/Services Living arrangements for the past 2 months: Single Family Home Lives with:: Adult Children Patient language and need for interpreter reviewed:: Yes Do you feel safe going back to the place where you live?: Yes      Need for Family Participation in Patient Care: Yes (Comment) Care giver support system in place?: Yes (comment)   Criminal Activity/Legal Involvement Pertinent to Current Situation/Hospitalization: No - Comment as needed  Activities of Daily Living Home Assistive Devices/Equipment: Walker (specify type) ADL Screening (condition at time of admission) Patient's cognitive ability adequate to safely complete daily activities?: Yes Is the patient deaf or have difficulty hearing?: No Does the patient have difficulty seeing, even when wearing  glasses/contacts?: No Does the patient have difficulty concentrating, remembering, or making decisions?: No Patient able to express need for assistance with ADLs?: Yes Does the patient have difficulty dressing or bathing?: No Independently performs ADLs?: Yes (appropriate for developmental age) Does the patient have difficulty walking or climbing stairs?: No Weakness of Legs: Left Weakness of Arms/Hands: None  Permission Sought/Granted                  Emotional Assessment Appearance:: Appears stated age Attitude/Demeanor/Rapport: (appropriate) Affect (typically observed): Appropriate Orientation: : Oriented to Self, Oriented to Place, Oriented to  Time, Oriented to Situation Alcohol / Substance Use: Not Applicable Psych Involvement: No (comment)  Admission diagnosis:  PERIPHERAL VASCULAR DISEASE WITH GANGRENE NON-VIABLE TISSUE RIGHT LOWER EXTREMITY AND LEFT GREAT TOE Patient Active Problem List   Diagnosis Date Noted  . Hospital discharge follow-up 04/15/2019  . Back pain 04/15/2019  . Warfarin anticoagulation 04/15/2019  . Ischemic pain of right foot 03/15/2019  . Ischemic foot 03/15/2019  . Subtherapeutic international normalized ratio (INR) 08/28/2018  . Acute vomiting   . Heart failure ([place]) 05/23/2018  . Hypertensive urgency 03/25/2018  . Benign essential HTN   . Crohn's disease with complication ([place])   . Diabetes mellitus type 2 in nonobese (HCC)   . Anemia of chronic disease   . Sinus tachycardia   . Left ventricular assist device (LVAD) complication 53/29/9242  . Left ventricular assist device (LVAD) complication, initial encounter 02/28/2018  . Factor V Leiden carrier ([place]) 01/10/2018  .  History of creation of ostomy ([place]) 01/10/2018  . ESRD (end stage renal disease) ([place]) 12/11/2017  . Left ventricular assist device present ([place]) 12/05/2017  . ESRD (end stage renal disease) on dialysis ([place]) 10/29/2017  . Intractable nausea and vomiting 07/31/2017  .  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 LVAD (left ventricular assist device) present ([place])   . Palliative care by specialist   . Chronic systolic CHF (congestive heart failure) ([place]) 06/07/2017  . ACP (advance care planning)   . Encounter for central line placement   . Palliative care encounter   . CHF exacerbation ([place]) 04/05/2017  . Diabetic keto-acidosis ([place]) 04/05/2017  . ESRD on dialysis ([place]) 01/17/2017  . AKI (acute kidney injury) ([place])   . Non-ST elevation (NSTEMI) myocardial infarction ([place])   . CHF (congestive heart failure) ([place]) 09/02/2016  . Elevated serum creatinine   . Encounter for therapeutic drug monitoring 08/03/2016  . Chest pain 04/26/2012  . SVT (supraventricular tachycardia) ([place]) 04/26/2012  . Gastroparesis 11/18/2011  . Coronary artery disease   . Ischemic cardiomyopathy   . Essential hypertension   . Automatic implantable cardioverter-defibrillator in situ   . Deep venous thrombosis ([place])   . POLYP, COLON 09/25/2008  . Dyslipidemia, goal LDL below 70 09/25/2008  . GASTRITIS 09/25/2008  . DIVERTICULOSIS, COLON 09/25/2008  . History of Crohn's disease 09/05/2006   PCP:  Richarda Osmond, DO Pharmacy:   [id number]: [phone]: [phone]    Social Determinants of Health (SDOH) Interventions    Readmission Risk Interventions Readmission Risk Prevention Plan 03/20/2019  Transportation Screening Complete  Medication Review ([place] Complete  HRI or HMarthaComplete  SW Recovery Care/Counseling Consult Complete  Palliative Care Screening Not [place] Applicable  Some recent data might be hidden

## 2019-05-03 LAB — GLUCOSE, CAPILLARY
Glucose-Capillary: 172 mg/dL — ABNORMAL HIGH (ref 70–99)
Glucose-Capillary: 153 mg/dL — ABNORMAL HIGH (ref 70–99)
Glucose-Capillary: 112 mg/dL — ABNORMAL HIGH (ref 70–99)
Glucose-Capillary: 175 mg/dL — ABNORMAL HIGH (ref 70–99)

## 2019-05-03 LAB — PROTIME-INR
INR: 1.4 — ABNORMAL HIGH (ref 0.8–1.2)
Prothrombin Time: 16.6 seconds — ABNORMAL HIGH (ref 11.4–15.2)

## 2019-05-03 LAB — RENAL FUNCTION PANEL
Albumin: 2.3 g/dL — ABNORMAL LOW (ref 3.5–5.0)
Anion gap: 12 (ref 5–15)
BUN: 47 mg/dL — ABNORMAL HIGH (ref 6–20)
CO2: 25 mmol/L (ref 22–32)
Calcium: 8.9 mg/dL (ref 8.9–10.3)
Chloride: 97 mmol/L — ABNORMAL LOW (ref 98–111)
Creatinine, Ser: 9 mg/dL — ABNORMAL HIGH (ref 0.61–1.24)
GFR calc Af Amer: 7 mL/min — ABNORMAL LOW (ref >60)
GFR calc non Af Amer: 6 mL/min — ABNORMAL LOW (ref >60)
Glucose, Bld: 164 mg/dL — ABNORMAL HIGH (ref 70–99)
Phosphorus: 5.3 mg/dL — ABNORMAL HIGH (ref 2.5–4.6)
Potassium: 4.3 mmol/L (ref 3.5–5.1)
Sodium: 134 mmol/L — ABNORMAL LOW (ref 135–145)

## 2019-05-03 LAB — CBC
HCT: 29.3 % — ABNORMAL LOW (ref 39.0–52.0)
Hemoglobin: 9.1 g/dL — ABNORMAL LOW (ref 13.0–17.0)
MCH: 28.8 pg (ref 26.0–34.0)
MCHC: 31.1 g/dL (ref 30.0–36.0)
MCV: 92.7 fL (ref 80.0–100.0)
Platelets: 299 10*3/uL (ref 150–400)
RBC: 3.16 MIL/uL — ABNORMAL LOW (ref 4.22–5.81)
RDW: 16.7 % — ABNORMAL HIGH (ref 11.5–15.5)
WBC: 22.4 10*3/uL — ABNORMAL HIGH (ref 4.0–10.5)
nRBC: 0.4 % — ABNORMAL HIGH (ref 0.0–0.2)

## 2019-05-03 LAB — LACTATE DEHYDROGENASE: LDH: 133 U/L (ref 98–192)

## 2019-05-03 LAB — HEPARIN LEVEL (UNFRACTIONATED)
Heparin Unfractionated: 0.27 IU/mL — ABNORMAL LOW (ref 0.30–0.70)
Heparin Unfractionated: >2.2 IU/mL — ABNORMAL HIGH (ref 0.30–0.70)
Heparin Unfractionated: 0.45 IU/mL (ref 0.30–0.70)

## 2019-05-03 MED ORDER — HEPARIN SODIUM (PORCINE) 1000 UNIT/ML IJ SOLN
INTRAMUSCULAR | Status: AC
  Administered 2019-05-03: 3400 [IU]
  Filled 2019-05-03: qty 4

## 2019-05-03 MED ORDER — MIDODRINE HCL 5 MG PO TABS
ORAL_TABLET | ORAL | Status: AC
  Filled 2019-05-03: qty 1

## 2019-05-03 MED ORDER — WARFARIN SODIUM 10 MG PO TABS
10.0000 mg | ORAL_TABLET | Freq: Once | ORAL | Status: AC
  Administered 2019-05-03: 10 mg via ORAL
  Filled 2019-05-03 (×2): qty 1

## 2019-05-03 NOTE — Progress Notes (Signed)
ANTICOAGULATION CONSULT NOTE - Follow Up Consult  Pharmacy Consult for Heparin Indication: LVAD  Allergies  Allergen Reactions  . Metformin And Related Diarrhea    Patient Measurements: Height: 5' 7.01" (170.2 cm) Weight: 190 lb 0.6 oz (86.2 kg) IBW/kg (Calculated) : 66.12 Heparin Dosing Weight: 83.2 kg  Vital Signs: Temp: 98.3 F (36.8 C) (08/27 2332) Temp Source: Oral (08/27 2332) BP: 111/90 (08/27 2334) Pulse Rate: 79 (08/27 2332)  Labs: Recent Labs    04/30/19 0346  05/01/19 0420 05/01/19 1700 05/02/19 0418 05/03/19 0100  HGB 9.6*  --  9.4*  --  9.3*  --   HCT 30.3*  --  31.0*  --  29.9*  --   PLT 297  --  326  --  338  --   LABPROT 17.3*  --  18.3*  --  17.2*  --   INR 1.4*  --  1.5*  --  1.4*  --   HEPARINUNFRC  --    < > 0.45 0.36 0.36 0.27*  CREATININE 7.02*  --  9.14*  --   --   --    < > = values in this interval not displayed.    Estimated Creatinine Clearance: 9.7 mL/min (A) (by C-G formula based on SCr of 9.14 mg/dL (H)).   Assessment: 28 yom w/ hx Heartmate 3 LVAD, factor V Leiden, and PAF - presenting with severe pain (plan for amputation this week). On warfarin PTA - regimen was 5 mg daily except 7.5 mg on Tuesday; however, INR elevated on 8/18 so plan was for 2.5 mg on Tues, 5 mg on Wed and Thursday, hold Friday.   Underwent arteriogram with placement of L SFA stent on 8/27 - plan to restart heparin 8 hours after sheath removal (removed ~0900).   INR today is 1.4 today- warfarin on hold all admission. Heparin level 0.36, therapeutic, on heparin drip rate 1500 units/hr. No s/sx of bleeding. No infusion issues per charting.   8/28 AM update: Heparin level just below goal after re-start No issues per RN  Goal of Therapy:  Heparin level 0.3-0.7 units/mL Monitor platelets by anticoagulation protocol: Yes   Plan:  Inc heparin to 1600 units/hr Re-check heparin level in 8 hours  Narda Bonds, PharmD, [place] Pharmacist Phone:  [phone]

## 2019-05-03 NOTE — Procedures (Signed)
I was present at this dialysis session. I have reviewed the session itself and made appropriate changes.   TDC, K 4.3 on 2K bath.  Goal UF 3L.  WIll go under outpt EDW which given recent issues is very possible.  TDC  Qb 350.   Filed Weights   05/02/19 0648 05/03/19 0422 05/03/19 0650  Weight: 86.2 kg 85.1 kg 84.9 kg    Recent Labs  Lab 05/03/19 0707  NA 134*  K 4.3  CL 97*  CO2 25  GLUCOSE 164*  BUN 47*  CREATININE 9.00*  CALCIUM 8.9  PHOS 5.3*    Recent Labs  Lab 05/01/19 0420 05/02/19 0418 05/03/19 0427  WBC 23.8* 27.6* 22.4*  HGB 9.4* 9.3* 9.1*  HCT 31.0* 29.9* 29.3*  MCV 93.7 92.3 92.7  PLT 326 338 299    Scheduled Meds: . aspirin EC  81 mg Oral Daily  . atorvastatin  40 mg Oral q1800  . calcitRIOL  0.5 mcg Oral Q M,W,F  . Chlorhexidine Gluconate Cloth  6 each Topical Q0600  . citalopram  20 mg Oral Daily  . erythromycin  250 mg Oral TID  . gabapentin  300 mg Oral BID  . hydrALAZINE  25 mg Oral 3 times per day on Sun Tue Thu Sat  . insulin aspart  0-9 Units Subcutaneous TID WC  . insulin aspart  7 Units Subcutaneous Q supper  . insulin glargine  10 Units Subcutaneous QHS  . LORazepam  0.5 mg Oral BID  . mouth rinse  15 mL Mouth Rinse BID  . metoCLOPramide  10 mg Oral TID WC  . midodrine  5 mg Oral Q M,W,F  . multivitamin  1 tablet Oral QHS  . sodium chloride flush  3 mL Intravenous Q12H  . traMADol  50 mg Oral Q6H  . Warfarin - Pharmacist Dosing Inpatient   Does not apply q1800   Continuous Infusions: . sodium chloride 10 mL/hr at 05/02/19 0636  . sodium chloride    . heparin 1,600 Units/hr (05/03/19 0214)   PRN Meds:.sodium chloride, acetaminophen, docusate sodium, hydrALAZINE, labetalol, ondansetron (ZOFRAN) IV, oxyCODONE, promethazine, promethazine, sodium chloride flush   Pearson Grippe  MD 05/03/2019, 8:29 AM

## 2019-05-03 NOTE — Plan of Care (Signed)
  Problem: Cardiac: Goal: LVAD will function as expected and patient will experience no clinical alarms 05/03/2019 1756 by Don Perking, RN Outcome: Progressing 05/03/2019 1544 by Don Perking, RN Outcome: Progressing   Problem: Health Behavior/Discharge Planning: Goal: Ability to manage health-related needs will improve 05/03/2019 1756 by Don Perking, RN Outcome: Progressing 05/03/2019 1544 by Don Perking, RN Outcome: Progressing   Problem: Clinical Measurements: Goal: Ability to maintain clinical measurements within normal limits will improve 05/03/2019 1756 by Don Perking, RN Outcome: Progressing 05/03/2019 1544 by Don Perking, RN Outcome: Progressing Goal: Will remain free from infection 05/03/2019 1756 by Don Perking, RN Outcome: Progressing 05/03/2019 1544 by Don Perking, RN Outcome: Progressing Goal: Diagnostic test results will improve 05/03/2019 1756 by Don Perking, RN Outcome: Progressing 05/03/2019 1544 by Don Perking, RN Outcome: Progressing Goal: Respiratory complications will improve 05/03/2019 1756 by Don Perking, RN Outcome: Progressing 05/03/2019 1544 by Don Perking, RN Outcome: Progressing Goal: Cardiovascular complication will be avoided 05/03/2019 1756 by Don Perking, RN Outcome: Progressing 05/03/2019 1544 by Don Perking, RN Outcome: Progressing   Problem: Activity: Goal: Risk for activity intolerance will decrease 05/03/2019 1756 by Don Perking, RN Outcome: Progressing 05/03/2019 1544 by Don Perking, RN Outcome: Progressing   Problem: Nutrition: Goal: Adequate nutrition will be maintained 05/03/2019 1756 by Don Perking, RN Outcome: Progressing 05/03/2019 1544 by Don Perking, RN Outcome: Progressing   Problem: Coping: Goal: Level of anxiety will decrease 05/03/2019 1756 by Don Perking, RN Outcome: Progressing 05/03/2019 1544 by  Don Perking, RN Outcome: Progressing   Problem: Elimination: Goal: Will not experience complications related to bowel motility 05/03/2019 1756 by Don Perking, RN Outcome: Progressing 05/03/2019 1544 by Don Perking, RN Outcome: Progressing Goal: Will not experience complications related to urinary retention 05/03/2019 1756 by Don Perking, RN Outcome: Progressing 05/03/2019 1544 by Don Perking, RN Outcome: Progressing   Problem: Pain Managment: Goal: General experience of comfort will improve 05/03/2019 1756 by Don Perking, RN Outcome: Progressing 05/03/2019 1544 by Don Perking, RN Outcome: Progressing   Problem: Safety: Goal: Ability to remain free from injury will improve 05/03/2019 1756 by Don Perking, RN Outcome: Progressing 05/03/2019 1544 by Don Perking, RN Outcome: Progressing   Problem: Skin Integrity: Goal: Risk for impaired skin integrity will decrease 05/03/2019 1756 by Don Perking, RN Outcome: Progressing 05/03/2019 1544 by Don Perking, RN Outcome: Progressing

## 2019-05-03 NOTE — Progress Notes (Signed)
  Progress Note    05/03/2019 8:18 AM 1 Day Post-Op  Subjective: No new complaints  Vitals:   05/03/19 0800 05/03/19 0815  BP: 111/84 109/75  Pulse: 81 78  Resp: 12 10  Temp:    SpO2:      Physical Exam: Awake alert oriented currently on dialysis  right groin soft no hematoma Right metatarsal amputation site clean dry intact with some sanguinous drainage Left great toe stable superficial ulceration  CBC    Component Value Date/Time   WBC 22.4 (H) 05/03/2019 0427   RBC 3.16 (L) 05/03/2019 0427   HGB 9.1 (L) 05/03/2019 0427   HGB 11.7 (L) 09/07/2016 1457   HCT 29.3 (L) 05/03/2019 0427   HCT 37.5 09/07/2016 1457   PLT 299 05/03/2019 0427   PLT 279 09/07/2016 1457   MCV 92.7 05/03/2019 0427   MCV 81 09/07/2016 1457   MCH 28.8 05/03/2019 0427   MCHC 31.1 05/03/2019 0427   RDW 16.7 (H) 05/03/2019 0427   RDW 14.9 09/07/2016 1457   LYMPHSABS 1.2 04/01/2019 1824   LYMPHSABS 1.3 09/07/2016 1457   MONOABS 1.3 (H) 04/01/2019 1824   EOSABS 0.4 04/01/2019 1824   EOSABS 0.2 09/07/2016 1457   BASOSABS 0.0 04/01/2019 1824   BASOSABS 0.0 09/07/2016 1457    BMET    Component Value Date/Time   NA 134 (L) 05/03/2019 0707   NA 144 05/11/2017 0938   K 4.3 05/03/2019 0707   CL 97 (L) 05/03/2019 0707   CO2 25 05/03/2019 0707   GLUCOSE 164 (H) 05/03/2019 0707   BUN 47 (H) 05/03/2019 0707   BUN 68 (H) 05/11/2017 0938   CREATININE 9.00 (H) 05/03/2019 0707   CREATININE 1.63 (H) 11/09/2016 1505   CALCIUM 8.9 05/03/2019 0707   CALCIUM 8.7 06/27/2017 1152   GFRNONAA 6 (L) 05/03/2019 0707   GFRNONAA 48 (L) 11/09/2016 1505   GFRAA 7 (L) 05/03/2019 0707   GFRAA 55 (L) 11/09/2016 1505    INR    Component Value Date/Time   INR 1.4 (H) 05/03/2019 0427     Intake/Output Summary (Last 24 hours) at 05/03/2019 0818 Last data filed at 05/03/2019 0700 Gross per 24 hour  Intake 826.98 ml  Output -  Net 826.98 ml     Assessment/plan:  53 y.o. male is s/p right foot  trans-metatarsal amputation and left lower extremity revascularization.  No further vascular procedures planned.  Okay to restart Coumadin.  I have discussed heel weightbearing for right foot need to protect left toes.  Please call on-call vascular surgery over weekend if there are issues otherwise I will revisit patient on Monday.  Brandon C. Donzetta Matters, MD [facility] Office: [phone] Pager: [phone]  05/03/2019 8:18 AM

## 2019-05-03 NOTE — Progress Notes (Signed)
Rehab Admissions Coordinator Note:  Per PT recommendation, this patient was screened by Jhonnie Garner for appropriateness for an Inpatient Acute Rehab Consult.  At this time, we are recommending Inpatient Rehab consult. AC will contact MD to request order.   Jhonnie Garner 05/03/2019, 6:08 PM  I can be reached at [phone].

## 2019-05-03 NOTE — Progress Notes (Signed)
Patient ID: [name], male   DOB: Jan 27, 1966, 53 y.o.   MRN: [id number]   Advanced Heart Failure VAD Team Note  PCP-Cardiologist: No primary care provider on file.   Subjective:    Admitted for ischemic foot pain with need for amputation => transmetatarsal amputation right foot on 8/25.   Left leg angiogram 8/27: DES to left SFA.  1 vessel runoff via peroneal, severe disease with multilevel stenosis but unable to intervene.   Minimal pain right foot.  Seen at HD.  Has boot now for right foot but has not been out of bed yet.   LVAD INTERROGATION:  HeartMate 3 LVAD:   Flow 3.1 L  liters/min, speed 5500, power 4.5, PI 6.1.       Objective:    Vital Signs:   Temp:  [97.8 F (36.6 C)-98.3 F (36.8 C)] 98 F (36.7 C) (08/28 0650) Pulse Rate:  [70-89] 73 (08/28 0830) Resp:  [8-21] 14 (08/28 0830) BP: (86-131)/(69-104) 113/79 (08/28 0830) SpO2:  [90 %-100 %] 100 % (08/28 0830) Weight:  [84.9 kg-85.1 kg] 84.9 kg (08/28 0650) Last BM Date: 04/25/19 Mean arterial Pressure 90s  Intake/Output:   Intake/Output Summary (Last 24 hours) at 05/03/2019 0835 Last data filed at 05/03/2019 0700 Gross per 24 hour  Intake 826.98 ml  Output -  Net 826.98 ml     Physical Exam    General: Well appearing this am. NAD.  HEENT: Normal. Neck: Supple, JVP 7-8 cm. Carotids OK.  Cardiac:  Mechanical heart sounds with LVAD hum present.  Lungs:  CTAB, normal effort.  Abdomen:  NT, ND, no HSM. No bruits or masses. +BS  LVAD exit site: Well-healed and incorporated. Dressing dry and intact. No erythema or drainage. Stabilization device present and accurately applied. Driveline dressing changed daily per sterile technique. Extremities:  S/p right foot TMA.  Neuro:  Alert & oriented x 3. Cranial nerves grossly intact. Moves all 4 extremities w/o difficulty. Affect pleasant     Telemetry   Sinus 90s Personally reviewed   Labs   Basic Metabolic Panel: Recent Labs  Lab 04/28/19 0533  04/29/19 0531 04/30/19 0346 05/01/19 0420 05/03/19 0707  NA 131* 131* 133* 130* 134*  K 4.3 4.1 3.9 4.9 4.3  CL 93* 88* 96* 95* 97*  CO2 26 25 27 24 25   GLUCOSE 78 121* 95 287* 164*  BUN 31* 42* 24* 41* 47*  CREATININE 8.38* 11.10* 7.02* 9.14* 9.00*  CALCIUM 8.8* 8.6* 9.3 9.1 8.9  PHOS  --   --   --   --  5.3*    Liver Function Tests: Recent Labs  Lab 04/30/19 0346 05/03/19 0707  AST 15  --   ALT 12  --   ALKPHOS 86  --   BILITOT 0.6  --   PROT 6.4*  --   ALBUMIN 2.6* 2.3*   No results for input(s): LIPASE, AMYLASE in the last 168 hours. No results for input(s): AMMONIA in the last 168 hours.  CBC: Recent Labs  Lab 04/29/19 0531 04/30/19 0346 05/01/19 0420 05/02/19 0418 05/03/19 0427  WBC 11.7* 14.7* 23.8* 27.6* 22.4*  HGB 9.4* 9.6* 9.4* 9.3* 9.1*  HCT 30.0* 30.3* 31.0* 29.9* 29.3*  MCV 92.0 92.4 93.7 92.3 92.7  PLT 321 297 326 338 299    INR: Recent Labs  Lab 04/29/19 0531 04/30/19 0346 05/01/19 0420 05/02/19 0418 05/03/19 0427  INR 1.6* 1.4* 1.5* 1.4* 1.4*    Other results:    Imaging  Dg Chest Port 1 View  Result Date: 05/02/2019 CLINICAL DATA:  Preop chest exam.  Coronary artery disease. EXAM: PORTABLE CHEST 1 VIEW COMPARISON:  Radiographs of April 26, 2019. FINDINGS: Stable cardiomegaly. Left ventricular assist device is unchanged in position. Left-sided pacemaker is unchanged in position. Left internal jugular dialysis catheter is unchanged in position. Right internal jugular catheter is stable. No pneumothorax is noted. Left lung is clear. Elevated right hemidiaphragm is noted with mild right basilar subsegmental atelectasis. Bony thorax is unremarkable. IMPRESSION: Stable support apparatus. Stable elevated right hemidiaphragm is noted with mild right basilar subsegmental atelectasis. Electronically Signed   By: Marijo Conception M.D.   On: 05/02/2019 07:14     Medications:     Scheduled Medications: . aspirin EC  81 mg Oral Daily  .  atorvastatin  40 mg Oral q1800  . calcitRIOL  0.5 mcg Oral Q M,W,F  . Chlorhexidine Gluconate Cloth  6 each Topical Q0600  . citalopram  20 mg Oral Daily  . erythromycin  250 mg Oral TID  . gabapentin  300 mg Oral BID  . hydrALAZINE  25 mg Oral 3 times per day on Sun Tue Thu Sat  . insulin aspart  0-9 Units Subcutaneous TID WC  . insulin aspart  7 Units Subcutaneous Q supper  . insulin glargine  10 Units Subcutaneous QHS  . LORazepam  0.5 mg Oral BID  . mouth rinse  15 mL Mouth Rinse BID  . metoCLOPramide  10 mg Oral TID WC  . midodrine  5 mg Oral Q M,W,F  . multivitamin  1 tablet Oral QHS  . sodium chloride flush  3 mL Intravenous Q12H  . traMADol  50 mg Oral Q6H  . Warfarin - Pharmacist Dosing Inpatient   Does not apply q1800    Infusions: . sodium chloride 10 mL/hr at 05/02/19 0636  . sodium chloride    . heparin 1,600 Units/hr (05/03/19 0214)    PRN Medications: sodium chloride, acetaminophen, docusate sodium, hydrALAZINE, labetalol, ondansetron (ZOFRAN) IV, oxyCODONE, promethazine, promethazine, sodium chloride flush  Assessment/Plan:    1. PAD/ischemic feet: S/p R transmetatarsal amputation on 8/25.  WBCs up to 27 post-op but afebrile, may be reactive.  WBCs now trending down.  He completed vancomycin for ?osteomyelitis on 8/19 (was getting with HD). Prior cultures NGTD. Left great toe dusky, now s/p left leg angiogram with SFA PCI.  There was 1 vessel runoff via left peroneal with severe multilevel stenosis => unable to intervene on peroneal.  - Cultures NGTD.  - Currently off abx.  2. Chronic systolic CHF: Ischemic cardiomyopathy. [employer] Jude ICD. NSTEMI in March 2018 with DES to LAD and RCA, complicated by cardiogenic shock, low output requiring milrinone. Echo 8/18 with EF 20-25%, moderate MR. CPX (8/18) with severe functional impairment due to HF. He is s/p HM3 LVAD placement. He had pump thrombosis 6/19 due to [facility] outflow graft kinking. He had pump exchange for another  HM3. INR1.4today.  - Volume managed at dialysis, MWF.   - Continue ASA 81and warfarin with INR goal 2-2.5 (heparin gtt while INR < 1.8, he has restarted warfarin).  - He has been [facility] for evaluation for heart/kidney transplantand recently saw Dr Mosetta Pigeon =>he is not candidate for transplantnowgiven extensive PAD.  - Consistently elevated MAP on non-HD days.  Continue hydralazine 25 mg tid on non-HD days. 3. ESRD: HD on M-W-F with midodrine pre-HD.  - HD today.  4.  CAD: NSTEMI in 3/18. LHC with  99% ulcerated lesion proximal RCA with left to right collaterals, 95% mid LAD stenosis after mid LAD stent. s/p PCI to RCA and LAD on 11/25/16. No chest pain.  - Continue statin and ASA. 5. h/o DVTs: Factor V Leiden heterozygote. -Anticoagulated. 6. Diabetic gastroparesis: He has seen gastroparesis specialist at [facility]. Surprisingly, gastric emptying study was normal. However, electrogastrogram showed poor gastric accomodation/capacity. Therefore, small frequent meals recommended.Nausea earlier this admission, now resolved.  - Continue reglan 10 mg tid/ac and Marinol.  - Continue Protonix bid.  - He will continue erythromycinindefinitely.  - Limit narcotic use as much as possible.  6. DM:SSI and home insulin regimen.  7. ERD:[id number] hydralazine on non-HD days.  8. He will need PT, will probably need stay in CIR before he goes home.   I reviewed the LVAD parameters from today, and compared the results to the patient's prior recorded data.  No programming changes were made.  The LVAD is functioning within specified parameters.  The patient performs LVAD self-test daily.  LVAD interrogation was negative for any significant power changes, alarms or PI events/speed drops.  LVAD equipment check completed and is in good working order.  Back-up equipment present.   LVAD education done on emergency procedures and precautions and reviewed exit site care.  Length of Stay: 7   Loralie Champagne, MD 05/03/2019, 8:35 AM  VAD Team --- VAD ISSUES ONLY--- Pager [phone] (7am - 7am)  Advanced Heart Failure Team  Pager [phone] (M-F; 7a - 4p)  Please contact [facility] for night-coverage after hours (4p -7a ) and weekends on [email or web address]

## 2019-05-03 NOTE — Progress Notes (Signed)
ANTICOAGULATION CONSULT NOTE - Follow Up Consult  Pharmacy Consult for Heparin Indication: LVAD  Allergies  Allergen Reactions  . Metformin And Related Diarrhea    Patient Measurements: Height: 5' 7.01" (170.2 cm) Weight: 186 lb 4.6 oz (84.5 kg) IBW/kg (Calculated) : 66.12 Heparin Dosing Weight: 83.2 kg  Vital Signs: Temp: 97.6 F (36.4 C) (08/28 1554) Temp Source: Oral (08/28 1554) BP: 107/91 (08/28 1554) Pulse Rate: 77 (08/28 1554)  Labs: Recent Labs    05/01/19 0420  05/02/19 0418 05/03/19 0100 05/03/19 0427 05/03/19 0707 05/03/19 1550  HGB 9.4*  --  9.3*  --  9.1*  --   --   HCT 31.0*  --  29.9*  --  29.3*  --   --   PLT 326  --  338  --  299  --   --   LABPROT 18.3*  --  17.2*  --  16.6*  --   --   INR 1.5*  --  1.4*  --  1.4*  --   --   HEPARINUNFRC 0.45   < > 0.36 0.27*  --  >2.20* 0.45  CREATININE 9.14*  --   --   --   --  9.00*  --    < > = values in this interval not displayed.    Estimated Creatinine Clearance: 9.8 mL/min (A) (by C-G formula based on SCr of 9 mg/dL (H)).   Assessment: 34 yom w/ hx Heartmate 3 LVAD, factor V Leiden, and PAF - presenting with severe pain (plan for amputation this week). On warfarin PTA - regimen was 5 mg daily except 7.5 mg on Tuesday; however, INR elevated on 8/18 so plan was for 2.5 mg on Tues, 5 mg on Wed and Thursday, hold Friday.   Underwent arteriogram with placement of L SFA stent on 8/27 - plan to restart heparin 8 hours after sheath removal (removed ~0900).   Heparin level now therapeutic, no S/Sx bleeding documented.  Goal of Therapy:  Heparin level 0.3-0.7 units/mL INR: 2-3 Monitor platelets by anticoagulation protocol: Yes   Plan:  Continue heparin to 1600 units/hr Daily heparin level, CBC, LDH, INR   Arrie Senate, PharmD, BCPS Clinical Pharmacist [phone] Please check AMION for all [place] numbers 05/03/2019

## 2019-05-03 NOTE — Progress Notes (Signed)
PT Cancellation Note  Patient Details Name: [name] MRN: [id number] DOB: 1966-06-07   Cancelled Treatment:    Reason Eval/Treat Not Completed: Patient at procedure or test/unavailable(Pt in HD.  Will return for PT eval as able.  )   Denice Paradise 05/03/2019, 8:23 AM [place] Pager:  [phone]  Office:  [phone]

## 2019-05-03 NOTE — Progress Notes (Signed)
ANTICOAGULATION CONSULT NOTE - Follow Up Consult  Pharmacy Consult for Heparin Indication: LVAD  Allergies  Allergen Reactions  . Metformin And Related Diarrhea    Patient Measurements: Height: 5' 7.01" (170.2 cm) Weight: 187 lb 2.7 oz (84.9 kg) IBW/kg (Calculated) : 66.12 Heparin Dosing Weight: 83.2 kg  Vital Signs: Temp: 98 F (36.7 C) (08/28 0650) Temp Source: Oral (08/28 0650) BP: 112/73 (08/28 0900) Pulse Rate: 70 (08/28 0900)  Labs: Recent Labs    05/01/19 0420  05/02/19 0418 05/03/19 0100 05/03/19 0427 05/03/19 0707  HGB 9.4*  --  9.3*  --  9.1*  --   HCT 31.0*  --  29.9*  --  29.3*  --   PLT 326  --  338  --  299  --   LABPROT 18.3*  --  17.2*  --  16.6*  --   INR 1.5*  --  1.4*  --  1.4*  --   HEPARINUNFRC 0.45   < > 0.36 0.27*  --  >2.20*  CREATININE 9.14*  --   --   --   --  9.00*   < > = values in this interval not displayed.    Estimated Creatinine Clearance: 9.8 mL/min (A) (by C-G formula based on SCr of 9 mg/dL (H)).   Assessment: 30 yom w/ hx Heartmate 3 LVAD, factor V Leiden, and PAF - presenting with severe pain (plan for amputation this week). On warfarin PTA - regimen was 5 mg daily except 7.5 mg on Tuesday; however, INR elevated on 8/18 so plan was for 2.5 mg on Tues, 5 mg on Wed and Thursday, hold Friday.   Underwent arteriogram with placement of L SFA stent on 8/27 - plan to restart heparin 8 hours after sheath removal (removed ~0900).   INR today is 1.4 today- warfarin on hold all admission, restarted 8/27. Patient currently in HD. No s/sx of bleeding. No infusion issues per charting.   Goal of Therapy:  Heparin level 0.3-0.7 units/mL INR: 2-3 Monitor platelets by anticoagulation protocol: Yes   Plan:  Continue heparin to 1600 units/hr - will move recheck heparin level to be drawn 4 hr after HD  Order warfarin 10 mg tonight Monitor daily HL, INR, CBC, and for s/sx of bleeding   Antonietta Jewel, PharmD, BCCCP Clinical Pharmacist   Phone: [phone]  Please check AMION for all [place] phone numbers After 10:00 PM, call [place] [phone]

## 2019-05-03 NOTE — Evaluation (Signed)
Physical Therapy Evaluation Patient Details Name: [name] MRN: [id number] DOB: 1965/12/10 Today's Date: 05/03/2019   History of Present Illness  Admitted for ischemic foot pain with need for amputation => transmetatarsal amputation right foot on 8/25. PMH - LVAD 10/18 with emergent pump exchange 6/19, ESRD on HD, CHF, CAD, DM, cardiomyopathy.  Clinical Impression  Pt admitted with above diagnosis. Pt was able to stand and pivot to get into recliner with +1 mod assist to power up and mod assist to stand and turn due to decr balance with pt not placing weight on right LE.  Feel that pt would benefit from rehab to gain strength to go home with son as son has been pushing pt around house on the rollator.  Will follow acutely.  Pt currently with functional limitations due to the deficits listed below (see PT Problem List). Pt will benefit from skilled PT to increase their independence and safety with mobility to allow discharge to the venue listed below.      Follow Up Recommendations CIR;Supervision/Assistance - 24 hour    Equipment Recommendations  Wheelchair (measurements PT);Wheelchair cushion (measurements PT)    Recommendations for Other Services Rehab consult     Precautions / Restrictions Precautions Precautions: Fall Required Braces or Orthoses: Other Brace(Darko shoe right foot) Restrictions Weight Bearing Restrictions: Yes RLE Weight Bearing: (weight bear on right heel only with Darko shoe)      Mobility  Bed Mobility Overal bed mobility: Modified Independent             General bed mobility comments: No assist needed but incr time to perform due to right LE pain  Transfers Overall transfer level: Needs assistance Equipment used: Rolling walker (2 wheeled) Transfers: Sit to/from [facility] Sit to Stand: Min assist;From elevated surface Stand pivot transfers: Min assist       General transfer comment: Assist for safety and verbal cues  for hand placement. Pt did not place right foot on floor was holding it up and hopped around to chair.  Needed min assist for safety and due to decr ability to tolerate weight on right foot.   Ambulation/Gait                Stairs            Wheelchair Mobility    Modified Rankin (Stroke Patients Only)       Balance Overall balance assessment: Needs assistance Sitting-balance support: No upper extremity supported;Feet supported Sitting balance-[name]: Fair     Standing balance support: Bilateral upper extremity supported;During functional activity Standing balance-[name]: Poor Standing balance comment: relies on UE support                              Pertinent Vitals/Pain Pain Assessment: Faces Pain Score: 8  Pain Location: rt foot Pain Descriptors / Indicators: Grimacing;Guarding;Aching Pain Intervention(s): Limited activity within patient's tolerance;Monitored during session;Repositioned;Premedicated before session    Home Living Family/patient expects to be discharged to:: Private residence Living Arrangements: Children Available Help at Discharge: Family;Available 24 hours/day Type of Home: House Home Access: Stairs to enter Entrance Stairs-Rails: None Entrance Stairs-Number of Steps: 2(uses cane and hurrycane on steps with sons assist) Home Layout: Multi-level;Able to live on main level with bedroom/bathroom Home Equipment: Kasandra Knudsen - single point;Walker - 4 wheels(Hurrycane, ) Additional Comments: pt lives with son and daughter, one of which is with him all the time. His bedroom is downstairs  but the kids have moved him to the main level so he will only have to do 2 STE    Prior Function Level of Independence: Needs assistance   Gait / Transfers Assistance Needed: son has been pushing pt around house on rollator due to pts painful right foot for the last month  ADL's / Homemaking Assistance Needed: ADLs with sons assist sponge bathes  due to LVAD        Hand Dominance   Dominant Hand: Right    Extremity/Trunk Assessment   Upper Extremity Assessment Upper Extremity Assessment: Defer to OT evaluation    Lower Extremity Assessment Lower Extremity Assessment: RLE deficits/detail RLE Deficits / Details: transmet amputation and limited by pain       Communication   Communication: No difficulties  Cognition Arousal/Alertness: Awake/alert Behavior During Therapy: WFL for tasks assessed/performed Overall Cognitive Status: Within Functional Limits for tasks assessed                                        General Comments General comments (skin integrity, edema, etc.): Pt was on LVAD batteries on arrival.  Switched pt to wall power and assist to sit in chair.,      Exercises     Assessment/Plan    PT Assessment Patient needs continued PT services  PT Problem List Decreased strength;Decreased activity tolerance;Decreased balance;Decreased mobility;Decreased knowledge of use of DME       PT Treatment Interventions DME instruction;Gait training;Stair training;Functional mobility training;Therapeutic activities;Therapeutic exercise;Patient/family education    PT Goals (Current goals can be found in the Care Plan section)  Acute Rehab PT Goals Patient Stated Goal: return home PT Goal Formulation: With patient Time For Goal Achievement: 05/17/19 Potential to Achieve Goals: Good    Frequency Min 3X/week   Barriers to discharge        Co-evaluation               AM-PAC PT "6 Clicks" Mobility  Outcome Measure Help needed turning from your back to your side while in a flat bed without using bedrails?: None Help needed moving from lying on your back to sitting on the side of a flat bed without using bedrails?: None Help needed moving to and from a bed to a chair (including a wheelchair)?: A Lot Help needed standing up from a chair using your arms (e.g., wheelchair or bedside chair)?:  A Lot Help needed to walk in hospital room?: Total Help needed climbing 3-5 steps with a railing? : Total 6 Click Score: 14    End of Session Equipment Utilized During Treatment: Gait belt Activity Tolerance: Patient limited by fatigue;Patient limited by pain Patient left: in chair;with call bell/phone within reach Nurse Communication: Mobility status PT Visit Diagnosis: Other abnormalities of gait and mobility (R26.89);Pain;Unsteadiness on feet (R26.81) Pain - Right/Left: Right Pain - part of body: Ankle and joints of foot    Time: 3785-8850 PT Time Calculation (min) (ACUTE ONLY): 29 min   Charges:   PT Evaluation $PT Eval Moderate Complexity: 1 Mod PT Treatments $Therapeutic Activity: 8-22 mins        Muad Noga,PT Acute Rehabilitation Services Pager:  [phone]  Office:  [phone]    Denice Paradise 05/03/2019, 3:46 PM

## 2019-05-03 NOTE — Plan of Care (Signed)
  Problem: Cardiac: Goal: LVAD will function as expected and patient will experience no clinical alarms Outcome: Progressing   Problem: Health Behavior/Discharge Planning: Goal: Ability to manage health-related needs will improve Outcome: Progressing   Problem: Clinical Measurements: Goal: Ability to maintain clinical measurements within normal limits will improve Outcome: Progressing Goal: Will remain free from infection Outcome: Progressing Goal: Diagnostic test results will improve Outcome: Progressing Goal: Respiratory complications will improve Outcome: Progressing Goal: Cardiovascular complication will be avoided Outcome: Progressing   Problem: Activity: Goal: Risk for activity intolerance will decrease Outcome: Progressing   Problem: Nutrition: Goal: Adequate nutrition will be maintained Outcome: Progressing   Problem: Coping: Goal: Level of anxiety will decrease Outcome: Progressing   Problem: Elimination: Goal: Will not experience complications related to bowel motility Outcome: Progressing Goal: Will not experience complications related to urinary retention Outcome: Progressing   Problem: Pain Managment: Goal: General experience of comfort will improve Outcome: Progressing   Problem: Safety: Goal: Ability to remain free from injury will improve Outcome: Progressing   Problem: Skin Integrity: Goal: Risk for impaired skin integrity will decrease Outcome: Progressing

## 2019-05-04 LAB — PROTIME-INR
INR: 1.4 — ABNORMAL HIGH (ref 0.8–1.2)
Prothrombin Time: 17.2 seconds — ABNORMAL HIGH (ref 11.4–15.2)

## 2019-05-04 LAB — HEPARIN LEVEL (UNFRACTIONATED): Heparin Unfractionated: 0.44 IU/mL (ref 0.30–0.70)

## 2019-05-04 LAB — GLUCOSE, CAPILLARY
Glucose-Capillary: 123 mg/dL — ABNORMAL HIGH (ref 70–99)
Glucose-Capillary: 102 mg/dL — ABNORMAL HIGH (ref 70–99)
Glucose-Capillary: 133 mg/dL — ABNORMAL HIGH (ref 70–99)
Glucose-Capillary: 220 mg/dL — ABNORMAL HIGH (ref 70–99)

## 2019-05-04 LAB — CBC
HCT: 28.9 % — ABNORMAL LOW (ref 39.0–52.0)
Hemoglobin: 8.9 g/dL — ABNORMAL LOW (ref 13.0–17.0)
MCH: 28.9 pg (ref 26.0–34.0)
MCHC: 30.8 g/dL (ref 30.0–36.0)
MCV: 93.8 fL (ref 80.0–100.0)
Platelets: 273 10*3/uL (ref 150–400)
RBC: 3.08 MIL/uL — ABNORMAL LOW (ref 4.22–5.81)
RDW: 17 % — ABNORMAL HIGH (ref 11.5–15.5)
WBC: 20.1 10*3/uL — ABNORMAL HIGH (ref 4.0–10.5)
nRBC: 0.3 % — ABNORMAL HIGH (ref 0.0–0.2)

## 2019-05-04 LAB — LACTATE DEHYDROGENASE: LDH: 174 U/L (ref 98–192)

## 2019-05-04 MED ORDER — WARFARIN SODIUM 10 MG PO TABS
10.0000 mg | ORAL_TABLET | Freq: Once | ORAL | Status: AC
  Administered 2019-05-04: 10 mg via ORAL
  Filled 2019-05-04: qty 1

## 2019-05-04 NOTE — Progress Notes (Signed)
Admit: 04/26/2019 LOS: 8  77M ESRD w/ LVAD and ischemic R foot for amputation  Subjective:   . HD yesterday only 0.5L UF limited by IDH . Possibly to CIR  08/28 0701 - 08/29 0700 In: 1207.9 [P.O.:840; I.V.:367.9] Out: 526   Filed Weights   05/03/19 0650 05/03/19 1100 05/04/19 0500  Weight: 84.9 kg 84.5 kg 86 kg    Scheduled Meds: . aspirin EC  81 mg Oral Daily  . atorvastatin  40 mg Oral q1800  . calcitRIOL  0.5 mcg Oral Q M,W,F  . Chlorhexidine Gluconate Cloth  6 each Topical Q0600  . citalopram  20 mg Oral Daily  . erythromycin  250 mg Oral TID  . gabapentin  300 mg Oral BID  . hydrALAZINE  25 mg Oral 3 times per day on Sun Tue Thu Sat  . insulin aspart  0-9 Units Subcutaneous TID WC  . insulin aspart  7 Units Subcutaneous Q supper  . insulin glargine  10 Units Subcutaneous QHS  . LORazepam  0.5 mg Oral BID  . mouth rinse  15 mL Mouth Rinse BID  . metoCLOPramide  10 mg Oral TID WC  . midodrine  5 mg Oral Q M,W,F  . multivitamin  1 tablet Oral QHS  . sodium chloride flush  3 mL Intravenous Q12H  . traMADol  50 mg Oral Q6H  . warfarin  10 mg Oral ONCE-1800  . Warfarin - Pharmacist Dosing Inpatient   Does not apply q1800   Continuous Infusions: . sodium chloride 10 mL/hr at 05/02/19 0636  . sodium chloride    . heparin 1,600 Units/hr (05/03/19 0214)   PRN Meds:.sodium chloride, acetaminophen, docusate sodium, hydrALAZINE, labetalol, ondansetron (ZOFRAN) IV, oxyCODONE, promethazine, promethazine, sodium chloride flush  Current Labs: reviewed   Physical Exam:  Blood pressure 108/82, pulse 70, temperature 97.8 F (36.6 C), temperature source Oral, resp. rate 15, height 5' 7.01" (1.702 m), weight 86 kg, SpO2 93 %. NAD COnt hum from LVAD RRR NO LEE R TMA bandaged AAO x3 TDC L IJ  Dialysis Orders:  MWF at [facility] 4:15hr, 400/800, EDW 84kg, 2K/3Ca, TDC, no heparin - Hectoral 69mg IV q HD - Mircera 2012m q 2 weeks (last 8/18) - Venofer 50101mV weekly  A 1.  R  foot ischemia/dry gangrene + ischemic L foot: s/p R TMA 8/25 and angio with stent of LLE 8/27.   2. ESRD: Continue HD per MWF schedule 3. Hypertension/volume: BP controlled, UF limited 8/28 byt IDH,  Might need to inc midodrine and make sure holding hydralazine prior to HD  4.  Anemia:  Hgb 9.6 - not due for ESA yet. Anticipate post-op drop, transfuse prn. 5.  Metabolic bone disease: Ca OK, 6.  CAD/ICM/LVAD: Per HF team 7.  Type 2 DM with known severe gastroparesis  RyaPearson Grippe 05/04/2019, 8:55 AM  Recent Labs  Lab 04/30/19 0346 05/01/19 0420 05/03/19 0707  NA 133* 130* 134*  K 3.9 4.9 4.3  CL 96* 95* 97*  CO2 _0 GLUCOSE 95 287* 164*  BUN 24* 41* 47*  CREATININE 7.02* 9.14* 9.00*  CALCIUM 9.3 9.1 8.9  PHOS  --   --  5.3*   Recent Labs  Lab 05/02/19 0418 05/03/19 0427 05/04/19 0326  WBC 27.6* 22.4* 20.1*  HGB 9.3* 9.1* 8.9*  HCT 29.9* 29.3* 28.9*  MCV 92.3 92.7 93.8  PLT 338 299 273

## 2019-05-04 NOTE — Progress Notes (Signed)
ANTICOAGULATION CONSULT NOTE - Follow Up Consult  Pharmacy Consult for Heparin>warfarin Indication: LVAD  Allergies  Allergen Reactions  . Metformin And Related Diarrhea    Patient Measurements: Height: 5' 7.01" (170.2 cm) Weight: 189 lb 9.5 oz (86 kg) IBW/kg (Calculated) : 66.12 Heparin Dosing Weight: 83.2 kg  Vital Signs: Temp: 98.4 F (36.9 C) (08/29 0322) Temp Source: Oral (08/29 0322) BP: 104/91 (08/29 0322) Pulse Rate: 75 (08/29 0322)  Labs: Recent Labs    05/02/19 0418  05/03/19 0427 05/03/19 0707 05/03/19 1550 05/04/19 0326  HGB 9.3*  --  9.1*  --   --  8.9*  HCT 29.9*  --  29.3*  --   --  28.9*  PLT 338  --  299  --   --  273  LABPROT 17.2*  --  16.6*  --   --  17.2*  INR 1.4*  --  1.4*  --   --  1.4*  HEPARINUNFRC 0.36   < >  --  >2.20* 0.45 0.44  CREATININE  --   --   --  9.00*  --   --    < > = values in this interval not displayed.    Estimated Creatinine Clearance: 9.8 mL/min (A) (by C-G formula based on SCr of 9 mg/dL (H)).   Assessment: 75 yom w/ hx Heartmate 3 LVAD, factor V Leiden, and PAF - presenting with severe pain (plan for amputation this week). On warfarin PTA - regimen was 5 mg daily except 7.5 mg on Tuesday; however, INR elevated on 8/18 so plan was for 2.5 mg on Tues, 5 mg on Wed and Thursday, hold Friday.   Underwent arteriogram with placement of L SFA stent on 8/27 - plan to restart heparin 8 hours after sheath removal (removed ~0900).   INR today is unchanged at 1.4 today- warfarin restarted 8/27. No s/sx of bleeding. No infusion issues per charting.   Goal of Therapy:  Heparin level 0.3-0.7 units/mL INR: 2-3 Monitor platelets by anticoagulation protocol: Yes   Plan:  Continue heparin to 1600 units/hr Warfarin 10 mg again tonight Monitor daily HL, INR, CBC, and for s/sx of bleeding   Erin Hearing PharmD., BCPS Clinical Pharmacist 05/04/2019 7:41 AM  Please check AMION for all [place] phone numbers After 10:00 PM, call  [place] [phone]

## 2019-05-04 NOTE — Progress Notes (Signed)
Patient ID: [name], male   DOB: 05-21-1966, 53 y.o.   MRN: [id number]   Advanced Heart Failure VAD Team Note  PCP-Cardiologist: No primary care provider on file.   Subjective:    Admitted for ischemic foot pain with need for amputation => transmetatarsal amputation right foot on 8/25.   Left leg angiogram 8/27: DES to left SFA.  1 vessel runoff via peroneal, severe disease with multilevel stenosis but unable to intervene.   Still right foot pain.  Got out of bed yesterday with PT, recommend CIR.   MAP 80s  LVAD INTERROGATION:  HeartMate 3 LVAD:   Flow 3.8 L  liters/min, speed 5500, power 4.3, PI 5.4.  No PI events.       Objective:    Vital Signs:   Temp:  [97.6 F (36.4 C)-98.8 F (37.1 C)] 97.8 F (36.6 C) (08/29 0854) Pulse Rate:  [62-80] 70 (08/29 0854) Resp:  [11-20] 15 (08/29 0854) BP: (95-111)/(58-91) 108/82 (08/29 0854) SpO2:  [93 %-100 %] 93 % (08/29 0854) Weight:  [84.5 kg-86 kg] 86 kg (08/29 0500) Last BM Date: 04/25/19 Mean arterial Pressure 80s  Intake/Output:   Intake/Output Summary (Last 24 hours) at 05/04/2019 0946 Last data filed at 05/04/2019 0900 Gross per 24 hour  Intake 1447.86 ml  Output 526 ml  Net 921.86 ml     Physical Exam    General: Well appearing this am. NAD.  HEENT: Normal. Neck: Supple, JVP 7-8 cm. Carotids OK.  Cardiac:  Mechanical heart sounds with LVAD hum present.  Lungs:  CTAB, normal effort.  Abdomen:  NT, ND, no HSM. No bruits or masses. +BS  LVAD exit site: Well-healed and incorporated. Dressing dry and intact. No erythema or drainage. Stabilization device present and accurately applied. Driveline dressing changed daily per sterile technique. Extremities:  S/p right foot TMA Neuro:  Alert & oriented x 3. Cranial nerves grossly intact. Moves all 4 extremities w/o difficulty. Affect pleasant      Telemetry   Sinus 90s Personally reviewed   Labs   Basic Metabolic Panel: Recent Labs  Lab 04/28/19 0533  04/29/19 0531 04/30/19 0346 05/01/19 0420 05/03/19 0707  NA 131* 131* 133* 130* 134*  K 4.3 4.1 3.9 4.9 4.3  CL 93* 88* 96* 95* 97*  CO2 26 25 27 24 25   GLUCOSE 78 121* 95 287* 164*  BUN 31* 42* 24* 41* 47*  CREATININE 8.38* 11.10* 7.02* 9.14* 9.00*  CALCIUM 8.8* 8.6* 9.3 9.1 8.9  PHOS  --   --   --   --  5.3*    Liver Function Tests: Recent Labs  Lab 04/30/19 0346 05/03/19 0707  AST 15  --   ALT 12  --   ALKPHOS 86  --   BILITOT 0.6  --   PROT 6.4*  --   ALBUMIN 2.6* 2.3*   No results for input(s): LIPASE, AMYLASE in the last 168 hours. No results for input(s): AMMONIA in the last 168 hours.  CBC: Recent Labs  Lab 04/30/19 0346 05/01/19 0420 05/02/19 0418 05/03/19 0427 05/04/19 0326  WBC 14.7* 23.8* 27.6* 22.4* 20.1*  HGB 9.6* 9.4* 9.3* 9.1* 8.9*  HCT 30.3* 31.0* 29.9* 29.3* 28.9*  MCV 92.4 93.7 92.3 92.7 93.8  PLT 297 326 338 299 273    INR: Recent Labs  Lab 04/30/19 0346 05/01/19 0420 05/02/19 0418 05/03/19 0427 05/04/19 0326  INR 1.4* 1.5* 1.4* 1.4* 1.4*    Other results:    Imaging  No results found.   Medications:     Scheduled Medications: . aspirin EC  81 mg Oral Daily  . atorvastatin  40 mg Oral q1800  . calcitRIOL  0.5 mcg Oral Q M,W,F  . Chlorhexidine Gluconate Cloth  6 each Topical Q0600  . citalopram  20 mg Oral Daily  . erythromycin  250 mg Oral TID  . gabapentin  300 mg Oral BID  . hydrALAZINE  25 mg Oral 3 times per day on Sun Tue Thu Sat  . insulin aspart  0-9 Units Subcutaneous TID WC  . insulin aspart  7 Units Subcutaneous Q supper  . insulin glargine  10 Units Subcutaneous QHS  . LORazepam  0.5 mg Oral BID  . mouth rinse  15 mL Mouth Rinse BID  . metoCLOPramide  10 mg Oral TID WC  . midodrine  5 mg Oral Q M,W,F  . multivitamin  1 tablet Oral QHS  . sodium chloride flush  3 mL Intravenous Q12H  . traMADol  50 mg Oral Q6H  . warfarin  10 mg Oral ONCE-1800  . Warfarin - Pharmacist Dosing Inpatient   Does not  apply q1800    Infusions: . sodium chloride 10 mL/hr at 05/02/19 0636  . sodium chloride    . heparin 1,600 Units/hr (05/03/19 0214)    PRN Medications: sodium chloride, acetaminophen, docusate sodium, hydrALAZINE, labetalol, ondansetron (ZOFRAN) IV, oxyCODONE, promethazine, promethazine, sodium chloride flush  Assessment/Plan:    1. PAD/ischemic feet: S/p R transmetatarsal amputation on 8/25.  WBCs up to 27 post-op but afebrile, may be reactive.  WBCs now trending down.  He completed vancomycin for ?osteomyelitis on 8/19 (was getting with HD). Prior cultures NGTD. Left great toe dusky, now s/p left leg angiogram with SFA PCI.  There was 1 vessel runoff via left peroneal with severe multilevel stenosis => unable to intervene on peroneal.  - Cultures NGTD.  - Currently off abx.  2. Chronic systolic CHF: Ischemic cardiomyopathy. [employer] ICD. NSTEMI in March 2018 with DES to LAD and RCA, complicated by cardiogenic shock, low output requiring milrinone. Echo 8/18 with EF 20-25%, moderate MR. CPX (8/18) with severe functional impairment due to HF. He is s/p HM3 LVAD placement. He had pump thrombosis 6/19 due to [facility] outflow graft kinking. He had pump exchange for another HM3. INR1.4today.  - Volume managed at dialysis, MWF.   - Continue ASA 81and warfarin with INR goal 2-2.5 (heparin gtt while INR < 1.8, he has restarted warfarin).  - He has been [facility] to [facility] for evaluation for heart/kidney transplantand recently saw Dr Mosetta Pigeon =>he is not candidate for transplantnowgiven extensive PAD.  - Consistently elevated MAP on non-HD days.  Continue hydralazine 25 mg tid on non-HD days, MAP improved. 3. ESRD: HD on M-W-F with midodrine pre-HD.  4.  CAD: NSTEMI in 3/18. LHC with 99% ulcerated lesion proximal RCA with left to right collaterals, 95% mid LAD stenosis after mid LAD stent. s/p PCI to RCA and LAD on 11/25/16. No chest pain.  - Continue statin and ASA. 5. h/o DVTs: Factor V  Leiden heterozygote. -Anticoagulated. 6. Diabetic gastroparesis: He has seen gastroparesis specialist [facility]. Surprisingly, gastric emptying study was normal. However, electrogastrogram showed poor gastric accomodation/capacity. Therefore, small frequent meals recommended.Nausea earlier this admission, now resolved.  - Continue reglan 10 mg tid/ac and Marinol.  - Continue Protonix bid.  - He will continue erythromycinindefinitely.  - Limit narcotic use as much as possible.  6. DM:SSI and home  insulin regimen.  7. VAC:QPEAKLTY hydralazine on non-HD days.  8. Deconditioning: Continue PT.  PT recommend CIR, consult placed.   I reviewed the LVAD parameters from today, and compared the results to the patient's prior recorded data.  No programming changes were made.  The LVAD is functioning within specified parameters.  The patient performs LVAD self-test daily.  LVAD interrogation was negative for any significant power changes, alarms or PI events/speed drops.  LVAD equipment check completed and is in good working order.  Back-up equipment present.   LVAD education done on emergency procedures and precautions and reviewed exit site care.  Length of Stay: [place], MD 05/04/2019, 9:46 AM  VAD Team --- VAD ISSUES ONLY--- Pager [phone] (7am - 7am)  Advanced Heart Failure Team  Pager [phone] (M-F; 7a - 4p)  Please contact [facility] for night-coverage after hours (4p -7a ) and weekends on [email or web address]

## 2019-05-04 NOTE — Evaluation (Signed)
Occupational Therapy Evaluation Patient Details Name: [name] MRN: [id number] DOB: 05/01/1966 Today's Date: 05/04/2019    History of Present Illness 53 yo male admitted for ischemic foot pain with need for amputation => transmetatarsal amputation right foot on 8/25. PMH - LVAD 10/18 with emergent pump exchange 6/19, ESRD on HD, CHF, CAD, DM, cardiomyopathy   Clinical Impression   PTA, pt was living with his son and daughter who have been assisting him with ADLs and functional mobility for past month due to pain at RLE; prior to injury, pt was independent with ADLs and using cane for functional mobility. Pt requires Max A for LB ADLs and Mod-Max A for functional transfers with RW. Pt presenting with decreased balance with posterior lean in standing, poor strength, and decreased weight bearing tolerance at RLE due to pain. Pt will require further acute OT to facilitate safe dc and increased occupation performance. Due to pt's PLOF, good family support, and high motivation, recommend dc to CIR for intensive OT to optimize safety, independence with ADLs, and decrease caregiver burden.     Follow Up Recommendations  CIR;Supervision/Assistance - 24 hour    Equipment Recommendations  Other (comment)(Defer to next venue)    Recommendations for Other Services PT consult;Rehab consult     Precautions / Restrictions Precautions Precautions: Fall;Other (comment)(LVAD) Required Braces or Orthoses: Other Brace(Darko shoe) Other Brace: Darko shoe Restrictions Weight Bearing Restrictions: Yes      Mobility Bed Mobility Overal bed mobility: Modified Independent             General bed mobility comments: Increased time for pain at RLE  Transfers Overall transfer level: Needs assistance Equipment used: Rolling walker (2 wheeled) Transfers: Sit to/from [facility] Sit to Stand: Mod assist Stand pivot transfers: Max assist            Balance Overall balance  assessment: Needs assistance Sitting-balance support: No upper extremity supported;Feet supported Sitting balance-[name]: Fair Sitting balance - Comments: Sitting at EOB and able to bend forward to pull up sock   Standing balance support: Bilateral upper extremity supported;During functional activity Standing balance-[name]: Poor Standing balance comment: Reliant on physical A and UE support. Significant posterior lean                           ADL either performed or assessed with clinical judgement   ADL Overall ADL's : Needs assistance/impaired Eating/Feeding: Independent;Sitting   Grooming: Set up;Sitting   Upper Body Bathing: Supervision/ safety;Set up;Sitting   Lower Body Bathing: Moderate assistance;Maximal assistance;Sit to/from stand Lower Body Bathing Details (indicate cue type and reason): Max A for standing balance Upper Body Dressing : Set up;Supervision/safety;Sitting   Lower Body Dressing: Maximal assistance;Sit to/from stand Lower Body Dressing Details (indicate cue type and reason): Max A for donning shoes and darko shoe. Pt able to benf forward to pull up left sock  Toilet Transfer: Maximal assistance;Stand-pivot;RW(simulated to recliner) Toilet Transfer Details (indicate cue type and reason): Max A for posterior lean during pivot to recliner         Functional mobility during ADLs: Maximal assistance(stand pivot) General ADL Comments: Pt demonstrating decreased activity tolerance, balance, and strength.     Vision         Perception     Praxis      Pertinent Vitals/Pain Pain Assessment: 0-10 Pain Score: 4  Pain Location: Right foot Pain Descriptors / Indicators: Grimacing;Guarding;Aching Pain Intervention(s): Monitored during session;Repositioned;Other (  comment)(Heat applied for lower back pain)     Hand Dominance Right   Extremity/Trunk Assessment Upper Extremity Assessment Upper Extremity Assessment: Overall WFL for  tasks assessed   Lower Extremity Assessment Lower Extremity Assessment: Defer to PT evaluation RLE Deficits / Details: transmet amputation and limited by pain   Cervical / Trunk Assessment Cervical / Trunk Assessment: Other exceptions Cervical / Trunk Exceptions: Reports lower back pain (for about a month) since starting of foot infection   Communication Communication Communication: No difficulties   Cognition Arousal/Alertness: Awake/alert Behavior During Therapy: WFL for tasks assessed/performed Overall Cognitive Status: Within Functional Limits for tasks assessed                                 General Comments: Very motivated to participate in therapy and return to PLOF   General Comments  Pt requesting to stay on wall power for LVAD    Exercises     Shoulder Instructions      Home Living Family/patient expects to be discharged to:: Private residence Living Arrangements: Children Available Help at Discharge: Family;Available 24 hours/day Type of Home: House Home Access: Stairs to enter [employer] of Steps: 2 Entrance Stairs-Rails: None(Uses cane for support with son assist) Home Layout: Multi-level;Able to live on main level with bedroom/bathroom Alternate Level Stairs-Number of Steps: flight Alternate Level Stairs-Rails: Left Bathroom Shower/Tub: Tub/shower unit   Bathroom Toilet: Standard     Home Equipment: Cane - single point;Walker - 4 wheels(Hurrycane)   Additional Comments: pt lives with son and daughter, one of which is with him all the time. His bedroom is downstairs but the kids have moved him to the main level so he will only have to do 2 STE      Prior Functioning/Environment Level of Independence: Needs assistance  Gait / Transfers Assistance Needed: son has been pushing pt around house on rollator due to pts painful right foot for the last month ADL's / Homemaking Assistance Needed: ADLs with sons assist sponge bathes due  to LVAD            OT Problem List: Decreased strength;Decreased range of motion;Decreased activity tolerance;Impaired balance (sitting and/or standing);Decreased knowledge of use of DME or AE;Decreased knowledge of precautions;Pain      OT Treatment/Interventions: Self-care/ADL training;Therapeutic exercise;Energy conservation;DME and/or AE instruction;Therapeutic activities;Patient/family education    OT Goals(Current goals can be found in the care plan section) Acute Rehab OT Goals Patient Stated Goal: "I want to be able to walk independently again" OT Goal Formulation: With patient Time For Goal Achievement: 05/18/19 Potential to Achieve Goals: Good  OT Frequency: Min 2X/week   Barriers to D/C:            Co-evaluation              AM-PAC OT "6 Clicks" Daily Activity     Outcome Measure Help from another person eating meals?: None Help from another person taking care of personal grooming?: A Little Help from another person toileting, which includes using toliet, bedpan, or urinal?: A Lot Help from another person bathing (including washing, rinsing, drying)?: A Lot Help from another person to put on and taking off regular upper body clothing?: A Little Help from another person to put on and taking off regular lower body clothing?: A Lot 6 Click Score: 16   End of Session Equipment Utilized During Treatment: Rolling walker;Gait belt Nurse Communication: Mobility status  Activity  Tolerance: Patient tolerated treatment well Patient left: in chair;with call bell/phone within reach  OT Visit Diagnosis: Unsteadiness on feet (R26.81);Other abnormalities of gait and mobility (R26.89);Muscle weakness (generalized) (M62.81);Pain Pain - Right/Left: Right Pain - part of body: Ankle and joints of foot                Time: 7412-8786 OT Time Calculation (min): 28 min Charges:  OT General Charges $OT Visit: 1 Visit OT Evaluation $OT Eval Moderate Complexity: 1 Mod OT  Treatments $Self Care/Home Management : 8-22 mins  Olia Hinderliter MSOT, OTR/L Acute Rehab Pager: [phone] Office: [place] 05/04/2019, 11:13 AM

## 2019-05-05 LAB — CBC
HCT: 28.2 % — ABNORMAL LOW (ref 39.0–52.0)
Hemoglobin: 8.7 g/dL — ABNORMAL LOW (ref 13.0–17.0)
MCH: 28.7 pg (ref 26.0–34.0)
MCHC: 30.9 g/dL (ref 30.0–36.0)
MCV: 93.1 fL (ref 80.0–100.0)
Platelets: 280 10*3/uL (ref 150–400)
RBC: 3.03 MIL/uL — ABNORMAL LOW (ref 4.22–5.81)
RDW: 16.9 % — ABNORMAL HIGH (ref 11.5–15.5)
WBC: 19.6 10*3/uL — ABNORMAL HIGH (ref 4.0–10.5)
nRBC: 0.1 % (ref 0.0–0.2)

## 2019-05-05 LAB — GLUCOSE, CAPILLARY
Glucose-Capillary: 118 mg/dL — ABNORMAL HIGH (ref 70–99)
Glucose-Capillary: 170 mg/dL — ABNORMAL HIGH (ref 70–99)
Glucose-Capillary: 144 mg/dL — ABNORMAL HIGH (ref 70–99)
Glucose-Capillary: 189 mg/dL — ABNORMAL HIGH (ref 70–99)

## 2019-05-05 LAB — BASIC METABOLIC PANEL
Anion gap: 13 (ref 5–15)
BUN: 44 mg/dL — ABNORMAL HIGH (ref 6–20)
CO2: 25 mmol/L (ref 22–32)
Calcium: 8.9 mg/dL (ref 8.9–10.3)
Chloride: 92 mmol/L — ABNORMAL LOW (ref 98–111)
Creatinine, Ser: 7.95 mg/dL — ABNORMAL HIGH (ref 0.61–1.24)
GFR calc Af Amer: 8 mL/min — ABNORMAL LOW (ref >60)
GFR calc non Af Amer: 7 mL/min — ABNORMAL LOW (ref >60)
Glucose, Bld: 135 mg/dL — ABNORMAL HIGH (ref 70–99)
Potassium: 3.7 mmol/L (ref 3.5–5.1)
Sodium: 130 mmol/L — ABNORMAL LOW (ref 135–145)

## 2019-05-05 LAB — LACTATE DEHYDROGENASE: LDH: 140 U/L (ref 98–192)

## 2019-05-05 LAB — HEPARIN LEVEL (UNFRACTIONATED): Heparin Unfractionated: 0.44 IU/mL (ref 0.30–0.70)

## 2019-05-05 LAB — PROTIME-INR
INR: 1.7 — ABNORMAL HIGH (ref 0.8–1.2)
Prothrombin Time: 19.9 seconds — ABNORMAL HIGH (ref 11.4–15.2)

## 2019-05-05 MED ORDER — DARBEPOETIN ALFA 150 MCG/0.3ML IJ SOSY
150.0000 ug | PREFILLED_SYRINGE | INTRAMUSCULAR | Status: DC
  Administered 2019-05-06 – 2019-05-13 (×2): 150 ug via INTRAVENOUS
  Filled 2019-05-05 (×2): qty 0.3

## 2019-05-05 MED ORDER — WARFARIN SODIUM 7.5 MG PO TABS
7.5000 mg | ORAL_TABLET | Freq: Once | ORAL | Status: AC
  Administered 2019-05-05: 18:00:00 7.5 mg via ORAL
  Filled 2019-05-05: qty 1

## 2019-05-05 NOTE — Progress Notes (Signed)
Patient ID: [name], male   DOB: September 01, 1966, 53 y.o.   MRN: [id number]   Advanced Heart Failure VAD Team Note  PCP-Cardiologist: No primary care provider on file.   Subjective:    Admitted for ischemic foot pain with need for amputation => transmetatarsal amputation right foot on 8/25.   Left leg angiogram 8/27: DES to left SFA.  1 vessel runoff via peroneal, severe disease with multilevel stenosis but unable to intervene.   Still with right foot pain but improving.    MAP 80s-90s  LVAD INTERROGATION:  HeartMate 3 LVAD:   Flow 3.8 L  liters/min, speed 5500, power 4.4, PI 4.5.  No PI events.       Objective:    Vital Signs:   Temp:  [97.6 F (36.4 C)-98.2 F (36.8 C)] 98 F (36.7 C) (08/30 0705) Pulse Rate:  [74-76] 75 (08/30 0705) Resp:  [10-17] 14 (08/30 0705) BP: (99-120)/(70-100) 111/99 (08/30 0705) SpO2:  [96 %-100 %] 100 % (08/30 0705) Weight:  [87.3 kg] 87.3 kg (08/30 0300) Last BM Date: 04/25/19 Mean arterial Pressure 80s-90s  Intake/Output:   Intake/Output Summary (Last 24 hours) at 05/05/2019 0934 Last data filed at 05/05/2019 0500 Gross per 24 hour  Intake 845.75 ml  Output -  Net 845.75 ml     Physical Exam    General: Well appearing this am. NAD.  HEENT: Normal. Neck: Supple, JVP 7-8 cm. Carotids OK.  Cardiac:  Mechanical heart sounds with LVAD hum present.  Lungs:  CTAB, normal effort.  Abdomen:  NT, ND, no HSM. No bruits or masses. +BS  LVAD exit site: Well-healed and incorporated. Dressing dry and intact. No erythema or drainage. Stabilization device present and accurately applied. Driveline dressing changed daily per sterile technique. Extremities:  S/p right TMA Neuro:  Alert & oriented x 3. Cranial nerves grossly intact. Moves all 4 extremities w/o difficulty. Affect pleasant     Telemetry   Sinus 90s Personally reviewed   Labs   Basic Metabolic Panel: Recent Labs  Lab 04/29/19 0531 04/30/19 0346 05/01/19 0420 05/03/19  0707 05/05/19 0352  NA 131* 133* 130* 134* 130*  K 4.1 3.9 4.9 4.3 3.7  CL 88* 96* 95* 97* 92*  CO2 25 27 24 25 25   GLUCOSE 121* 95 287* 164* 135*  BUN 42* 24* 41* 47* 44*  CREATININE 11.10* 7.02* 9.14* 9.00* 7.95*  CALCIUM 8.6* 9.3 9.1 8.9 8.9  PHOS  --   --   --  5.3*  --     Liver Function Tests: Recent Labs  Lab 04/30/19 0346 05/03/19 0707  AST 15  --   ALT 12  --   ALKPHOS 86  --   BILITOT 0.6  --   PROT 6.4*  --   ALBUMIN 2.6* 2.3*   No results for input(s): LIPASE, AMYLASE in the last 168 hours. No results for input(s): AMMONIA in the last 168 hours.  CBC: Recent Labs  Lab 05/01/19 0420 05/02/19 0418 05/03/19 0427 05/04/19 0326 05/05/19 0352  WBC 23.8* 27.6* 22.4* 20.1* 19.6*  HGB 9.4* 9.3* 9.1* 8.9* 8.7*  HCT 31.0* 29.9* 29.3* 28.9* 28.2*  MCV 93.7 92.3 92.7 93.8 93.1  PLT 326 338 299 273 280    INR: Recent Labs  Lab 05/01/19 0420 05/02/19 0418 05/03/19 0427 05/04/19 0326 05/05/19 0352  INR 1.5* 1.4* 1.4* 1.4* 1.7*    Other results:    Imaging   No results found.   Medications:  Scheduled Medications: . aspirin EC  81 mg Oral Daily  . atorvastatin  40 mg Oral q1800  . calcitRIOL  0.5 mcg Oral Q M,W,F  . Chlorhexidine Gluconate Cloth  6 each Topical Q0600  . citalopram  20 mg Oral Daily  . [START ON 05/06/2019] darbepoetin (ARANESP) injection - DIALYSIS  150 mcg Intravenous Q Mon-HD  . erythromycin  250 mg Oral TID  . gabapentin  300 mg Oral BID  . hydrALAZINE  25 mg Oral 3 times per day on Sun Tue Thu Sat  . insulin aspart  0-9 Units Subcutaneous TID WC  . insulin aspart  7 Units Subcutaneous Q supper  . insulin glargine  10 Units Subcutaneous QHS  . LORazepam  0.5 mg Oral BID  . mouth rinse  15 mL Mouth Rinse BID  . metoCLOPramide  10 mg Oral TID WC  . midodrine  5 mg Oral Q M,W,F  . multivitamin  1 tablet Oral QHS  . sodium chloride flush  3 mL Intravenous Q12H  . traMADol  50 mg Oral Q6H  . warfarin  7.5 mg Oral  ONCE-1800  . Warfarin - Pharmacist Dosing Inpatient   Does not apply q1800    Infusions: . sodium chloride 10 mL/hr at 05/02/19 0636  . sodium chloride    . heparin 1,600 Units/hr (05/05/19 0055)    PRN Medications: sodium chloride, acetaminophen, docusate sodium, hydrALAZINE, labetalol, ondansetron (ZOFRAN) IV, oxyCODONE, promethazine, promethazine, sodium chloride flush  Assessment/Plan:    1. PAD/ischemic feet: S/p R transmetatarsal amputation on 8/25.  WBCs up to 27 post-op but afebrile, may be reactive.  WBCs now trending down.  He completed vancomycin for ?osteomyelitis on 8/19 (was getting with HD). Prior cultures NGTD. Left great toe dusky, now s/p left leg angiogram with SFA PCI.  There was 1 vessel runoff via left peroneal with severe multilevel stenosis => unable to intervene on peroneal.  - Cultures NGTD.  - Currently off abx.  2. Chronic systolic CHF: Ischemic cardiomyopathy. [employer] ICD. NSTEMI in March 2018 with DES to LAD and RCA, complicated by cardiogenic shock, low output requiring milrinone. Echo 8/18 with EF 20-25%, moderate MR. CPX (8/18) with severe functional impairment due to HF. He is s/p HM3 LVAD placement. He had pump thrombosis 6/19 due to [facility] outflow graft kinking. He had pump exchange for another HM3. INR1.4today.  - Volume managed at dialysis, MWF.   - Continue ASA 81and warfarin with INR goal 2-2.5.  Heparin gtt while INR < 1.8, he has restarted warfarin.  INR 1.7 today.   - He has been referred to [facility] for evaluation for heart/kidney transplantand recently saw Dr Mosetta Pigeon =>he is not candidate for transplantnowgiven extensive PAD.  - Consistently elevated MAP on non-HD days.  Continue hydralazine 25 mg tid on non-HD days, MAP improved. 3. ESRD: HD on M-W-F with midodrine pre-HD.  4.  CAD: NSTEMI in 3/18. LHC with 99% ulcerated lesion proximal RCA with left to right collaterals, 95% mid LAD stenosis after mid LAD stent. s/p PCI to RCA and LAD on  11/25/16. No chest pain.  - Continue statin and ASA. 5. h/o DVTs: Factor V Leiden heterozygote. -Anticoagulated. 6. Diabetic gastroparesis: He has seen gastroparesis specialist at [facility]. Surprisingly, gastric emptying study was normal. However, electrogastrogram showed poor gastric accomodation/capacity. Therefore, small frequent meals recommended.Nausea earlier this admission, now resolved.  - Continue reglan 10 mg tid/ac and Marinol.  - Continue Protonix bid.  - He will continue erythromycinindefinitely.  -  Limit narcotic use as much as possible.  6. DM:SSI and home insulin regimen.  7. CNG:FREVQWQV hydralazine on non-HD days.  8. Deconditioning: Continue PT.  PT recommend CIR, consult placed and awaiting evaluation.   I reviewed the LVAD parameters from today, and compared the results to the patient's prior recorded data.  No programming changes were made.  The LVAD is functioning within specified parameters.  The patient performs LVAD self-test daily.  LVAD interrogation was negative for any significant power changes, alarms or PI events/speed drops.  LVAD equipment check completed and is in good working order.  Back-up equipment present.   LVAD education done on emergency procedures and precautions and reviewed exit site care.  Length of Stay: 45  Loralie Champagne, MD 05/05/2019, 9:34 AM  VAD Team --- VAD ISSUES ONLY--- Pager [phone] (7am - 7am)  Advanced Heart Failure Team  Pager [phone] (M-F; 7a - 4p)  Please contact [facility] for night-coverage after hours (4p -7a ) and weekends on [email or web address]

## 2019-05-05 NOTE — Progress Notes (Signed)
Admit: 04/26/2019 LOS: 9  28M ESRD w/ LVAD and ischemic R foot s/p TMA and LLE angiogram + stent  Subjective:   . No issues  08/29 0701 - 08/30 0700 In: 1085.8 [P.O.:720; I.V.:365.8] Out: -   Filed Weights   05/03/19 1100 05/04/19 0500 05/05/19 0300  Weight: 84.5 kg 86 kg 87.3 kg    Scheduled Meds: . aspirin EC  81 mg Oral Daily  . atorvastatin  40 mg Oral q1800  . calcitRIOL  0.5 mcg Oral Q M,W,F  . Chlorhexidine Gluconate Cloth  6 each Topical Q0600  . citalopram  20 mg Oral Daily  . erythromycin  250 mg Oral TID  . gabapentin  300 mg Oral BID  . hydrALAZINE  25 mg Oral 3 times per day on Sun Tue Thu Sat  . insulin aspart  0-9 Units Subcutaneous TID WC  . insulin aspart  7 Units Subcutaneous Q supper  . insulin glargine  10 Units Subcutaneous QHS  . LORazepam  0.5 mg Oral BID  . mouth rinse  15 mL Mouth Rinse BID  . metoCLOPramide  10 mg Oral TID WC  . midodrine  5 mg Oral Q M,W,F  . multivitamin  1 tablet Oral QHS  . sodium chloride flush  3 mL Intravenous Q12H  . traMADol  50 mg Oral Q6H  . Warfarin - Pharmacist Dosing Inpatient   Does not apply q1800   Continuous Infusions: . sodium chloride 10 mL/hr at 05/02/19 0636  . sodium chloride    . heparin 1,600 Units/hr (05/05/19 0055)   PRN Meds:.sodium chloride, acetaminophen, docusate sodium, hydrALAZINE, labetalol, ondansetron (ZOFRAN) IV, oxyCODONE, promethazine, promethazine, sodium chloride flush  Current Labs: reviewed   Physical Exam:  Blood pressure (!) 111/99, pulse 75, temperature 98 F (36.7 C), temperature source Oral, resp. rate 14, height 5' 7.01" (1.702 m), weight 87.3 kg, SpO2 100 %. NAD COnt hum from LVAD RRR NO LEE R TMA bandaged AAO x3 TDC L IJ  Dialysis Orders:  MWF at [facility] 4:15hr, 400/800, EDW 84kg, 2K/3Ca, TDC, no heparin - Hectoral 63mg IV q HD - Mircera 205m q 2 weeks (last 8/18) - Venofer 5071mV weekly  A 1.  R foot ischemia/dry gangrene + ischemic L foot: s/p R TMA 8/25 and  angio with stent of LLE 8/27.   2. ESRD: Continue HD per MWF schedule: 2K, 3L UF goal, No heparin, 4h, 400/800, Midodrine pre HD and at mid-way point 3. Hypertension/volume: BP controlled, UF limited 8/28 byt IDH,  Inc midodrine  4.  Anemia:  Hgb down, resume Aranesp 150 with HD 8/31 5.  Metabolic bone disease: Ca OK, on C3 6.  CAD/ICM/LVAD: Per HF team 7.  Type 2 DM with known severe gastroparesis  RyaPearson Grippe 05/05/2019, 8:37 AM  Recent Labs  Lab 05/01/19 0420 05/03/19 0707 05/05/19 0352  NA 130* 134* 130*  K 4.9 4.3 3.7  CL 95* 97* 92*  CO2 24 25 25   GLUCOSE 287* 164* 135*  BUN 41* 47* 44*  CREATININE 9.14* 9.00* 7.95*  CALCIUM 9.1 8.9 8.9  PHOS  --  5.3*  --    Recent Labs  Lab 05/03/19 0427 05/04/19 0326 05/05/19 0352  WBC 22.4* 20.1* 19.6*  HGB 9.1* 8.9* 8.7*  HCT 29.3* 28.9* 28.2*  MCV 92.7 93.8 93.1  PLT 299 273 280

## 2019-05-05 NOTE — Plan of Care (Signed)

## 2019-05-05 NOTE — Progress Notes (Signed)
ANTICOAGULATION CONSULT NOTE - Follow Up Consult  Pharmacy Consult for Heparin>warfarin Indication: LVAD  Allergies  Allergen Reactions  . Metformin And Related Diarrhea    Patient Measurements: Height: 5' 7.01" (170.2 cm) Weight: 192 lb 7.4 oz (87.3 kg) IBW/kg (Calculated) : 66.12 Heparin Dosing Weight: 83.2 kg  Vital Signs: Temp: 98 F (36.7 C) (08/30 0705) Temp Source: Oral (08/30 0705) BP: 111/99 (08/30 0705) Pulse Rate: 75 (08/30 0705)  Labs: Recent Labs    05/03/19 0427 05/03/19 0707 05/03/19 1550 05/04/19 0326 05/05/19 0352  HGB 9.1*  --   --  8.9* 8.7*  HCT 29.3*  --   --  28.9* 28.2*  PLT 299  --   --  273 280  LABPROT 16.6*  --   --  17.2* 19.9*  INR 1.4*  --   --  1.4* 1.7*  HEPARINUNFRC  --  >2.20* 0.45 0.44 0.44  CREATININE  --  9.00*  --   --  7.95*    Estimated Creatinine Clearance: 11.2 mL/min (A) (by C-G formula based on SCr of 7.95 mg/dL (H)).   Assessment: 10 yom w/ hx Heartmate 3 LVAD, factor V Leiden, and PAF - presenting with severe pain (plan for amputation this week). On warfarin PTA - regimen was 5 mg daily except 7.5 mg on Tuesday; however, INR elevated on 8/18 so plan was for 2.5 mg on Tues, 5 mg on Wed and Thursday, hold Friday.   Underwent arteriogram with placement of L SFA stent on 8/27.  INR trending up to 1.7 today- warfarin restarted 8/27. No s/sx of bleeding. No infusion issues per noted.   Goal of Therapy:  Heparin level 0.3-0.7 units/mL INR: 2-3 Monitor platelets by anticoagulation protocol: Yes   Plan:  Continue heparin to 1600 units/hr Warfarin 7.5 mg tonight Monitor daily HL, INR, CBC, and for s/sx of bleeding   Erin Hearing PharmD., BCPS Clinical Pharmacist 05/05/2019 8:36 AM  Please check AMION for all [place] phone numbers After 10:00 PM, call [place] [phone]

## 2019-05-06 LAB — GLUCOSE, CAPILLARY
Glucose-Capillary: 104 mg/dL — ABNORMAL HIGH (ref 70–99)
Glucose-Capillary: 138 mg/dL — ABNORMAL HIGH (ref 70–99)
Glucose-Capillary: 151 mg/dL — ABNORMAL HIGH (ref 70–99)
Glucose-Capillary: 106 mg/dL — ABNORMAL HIGH (ref 70–99)

## 2019-05-06 LAB — CBC
HCT: 26.5 % — ABNORMAL LOW (ref 39.0–52.0)
Hemoglobin: 8.3 g/dL — ABNORMAL LOW (ref 13.0–17.0)
MCH: 28.7 pg (ref 26.0–34.0)
MCHC: 31.3 g/dL (ref 30.0–36.0)
MCV: 91.7 fL (ref 80.0–100.0)
Platelets: 272 10*3/uL (ref 150–400)
RBC: 2.89 MIL/uL — ABNORMAL LOW (ref 4.22–5.81)
RDW: 16.7 % — ABNORMAL HIGH (ref 11.5–15.5)
WBC: 19.8 10*3/uL — ABNORMAL HIGH (ref 4.0–10.5)
nRBC: 0.1 % (ref 0.0–0.2)

## 2019-05-06 LAB — BASIC METABOLIC PANEL
Anion gap: 16 — ABNORMAL HIGH (ref 5–15)
BUN: 60 mg/dL — ABNORMAL HIGH (ref 6–20)
CO2: 22 mmol/L (ref 22–32)
Calcium: 8.7 mg/dL — ABNORMAL LOW (ref 8.9–10.3)
Chloride: 92 mmol/L — ABNORMAL LOW (ref 98–111)
Creatinine, Ser: 10.09 mg/dL — ABNORMAL HIGH (ref 0.61–1.24)
GFR calc Af Amer: 6 mL/min — ABNORMAL LOW (ref >60)
GFR calc non Af Amer: 5 mL/min — ABNORMAL LOW (ref >60)
Glucose, Bld: 112 mg/dL — ABNORMAL HIGH (ref 70–99)
Potassium: 4.1 mmol/L (ref 3.5–5.1)
Sodium: 130 mmol/L — ABNORMAL LOW (ref 135–145)

## 2019-05-06 LAB — PROTIME-INR
INR: 2.2 — ABNORMAL HIGH (ref 0.8–1.2)
Prothrombin Time: 23.8 seconds — ABNORMAL HIGH (ref 11.4–15.2)

## 2019-05-06 LAB — HEPARIN LEVEL (UNFRACTIONATED): Heparin Unfractionated: 0.42 IU/mL (ref 0.30–0.70)

## 2019-05-06 LAB — LACTATE DEHYDROGENASE: LDH: 152 U/L (ref 98–192)

## 2019-05-06 MED ORDER — DARBEPOETIN ALFA 150 MCG/0.3ML IJ SOSY
PREFILLED_SYRINGE | INTRAMUSCULAR | Status: AC
  Filled 2019-05-06: qty 0.3

## 2019-05-06 MED ORDER — WARFARIN SODIUM 7.5 MG PO TABS
7.5000 mg | ORAL_TABLET | ORAL | Status: DC
  Administered 2019-05-07: 17:00:00 7.5 mg via ORAL
  Filled 2019-05-06: qty 1

## 2019-05-06 MED ORDER — HEPARIN SODIUM (PORCINE) 1000 UNIT/ML IJ SOLN
INTRAMUSCULAR | Status: AC
  Administered 2019-05-06: 3400 [IU] via INTRAVENOUS_CENTRAL
  Filled 2019-05-06: qty 4

## 2019-05-06 MED ORDER — WARFARIN SODIUM 5 MG PO TABS
5.0000 mg | ORAL_TABLET | ORAL | Status: AC
  Administered 2019-05-06: 5 mg via ORAL
  Filled 2019-05-06: qty 1

## 2019-05-06 MED ORDER — MIDODRINE HCL 5 MG PO TABS
ORAL_TABLET | ORAL | Status: AC
  Filled 2019-05-06: qty 1

## 2019-05-06 NOTE — Progress Notes (Signed)
Patient ID: [name], male   DOB: 06/09/1966, 54 y.o.   MRN: [id number]   Advanced Heart Failure VAD Team Note  PCP-Cardiologist: No primary care provider on file.   Subjective:    Admitted for ischemic foot pain with need for amputation => transmetatarsal amputation right foot on 8/25.   Left leg angiogram 8/27: DES to left SFA.  1 vessel runoff via peroneal, severe disease with multilevel stenosis but unable to intervene.   Still with right foot pain but improving.  Up to chair yesterday.   MAP 90s  LVAD INTERROGATION:  HeartMate 3 LVAD:   Flow 3.8 L  liters/min, speed 5500, power 4.5, PI 4.4.     Objective:    Vital Signs:   Temp:  [98 F (36.7 C)-98.6 F (37 C)] 98.4 F (36.9 C) (08/31 0358) Pulse Rate:  [72-74] 74 (08/30 1053) Resp:  [11-19] 19 (08/31 0358) BP: (97-116)/(83-98) 116/98 (08/31 0358) SpO2:  [93 %-100 %] 98 % (08/31 0358) Weight:  [90.1 kg] 90.1 kg (08/31 0500) Last BM Date: 04/25/19 Mean arterial Pressure 90s  Intake/Output:   Intake/Output Summary (Last 24 hours) at 05/06/2019 0752 Last data filed at 05/06/2019 0400 Gross per 24 hour  Intake 486.22 ml  Output -  Net 486.22 ml     Physical Exam    General: Well appearing this am. NAD.  HEENT: Normal. Neck: Supple, JVP 7-8 cm. Carotids OK.  Cardiac:  Mechanical heart sounds with LVAD hum present.  Lungs:  CTAB, normal effort.  Abdomen:  NT, ND, no HSM. No bruits or masses. +BS  LVAD exit site: Well-healed and incorporated. Dressing dry and intact. No erythema or drainage. Stabilization device present and accurately applied. Driveline dressing changed daily per sterile technique. Extremities: s/p right TMA Neuro:  Alert & oriented x 3. Cranial nerves grossly intact. Moves all 4 extremities w/o difficulty. Affect pleasant     Telemetry   Sinus 90s Personally reviewed   Labs   Basic Metabolic Panel: Recent Labs  Lab 04/30/19 0346 05/01/19 0420 05/03/19 0707 05/05/19 0352  05/06/19 0415  NA 133* 130* 134* 130* 130*  K 3.9 4.9 4.3 3.7 4.1  CL 96* 95* 97* 92* 92*  CO2 27 24 25 25 22   GLUCOSE 95 287* 164* 135* 112*  BUN 24* 41* 47* 44* 60*  CREATININE 7.02* 9.14* 9.00* 7.95* 10.09*  CALCIUM 9.3 9.1 8.9 8.9 8.7*  PHOS  --   --  5.3*  --   --     Liver Function Tests: Recent Labs  Lab 04/30/19 0346 05/03/19 0707  AST 15  --   ALT 12  --   ALKPHOS 86  --   BILITOT 0.6  --   PROT 6.4*  --   ALBUMIN 2.6* 2.3*   No results for input(s): LIPASE, AMYLASE in the last 168 hours. No results for input(s): AMMONIA in the last 168 hours.  CBC: Recent Labs  Lab 05/02/19 0418 05/03/19 0427 05/04/19 0326 05/05/19 0352 05/06/19 0415  WBC 27.6* 22.4* 20.1* 19.6* 19.8*  HGB 9.3* 9.1* 8.9* 8.7* 8.3*  HCT 29.9* 29.3* 28.9* 28.2* 26.5*  MCV 92.3 92.7 93.8 93.1 91.7  PLT 338 299 273 280 272    INR: Recent Labs  Lab 05/02/19 0418 05/03/19 0427 05/04/19 0326 05/05/19 0352 05/06/19 0415  INR 1.4* 1.4* 1.4* 1.7* 2.2*    Other results:    Imaging   No results found.   Medications:     Scheduled Medications: .  aspirin EC  81 mg Oral Daily  . atorvastatin  40 mg Oral q1800  . calcitRIOL  0.5 mcg Oral Q M,W,F  . Chlorhexidine Gluconate Cloth  6 each Topical Q0600  . citalopram  20 mg Oral Daily  . darbepoetin (ARANESP) injection - DIALYSIS  150 mcg Intravenous Q Mon-HD  . erythromycin  250 mg Oral TID  . gabapentin  300 mg Oral BID  . hydrALAZINE  25 mg Oral 3 times per day on Sun Tue Thu Sat  . insulin aspart  0-9 Units Subcutaneous TID WC  . insulin aspart  7 Units Subcutaneous Q supper  . insulin glargine  10 Units Subcutaneous QHS  . LORazepam  0.5 mg Oral BID  . mouth rinse  15 mL Mouth Rinse BID  . metoCLOPramide  10 mg Oral TID WC  . midodrine  5 mg Oral Q M,W,F  . multivitamin  1 tablet Oral QHS  . sodium chloride flush  3 mL Intravenous Q12H  . traMADol  50 mg Oral Q6H  . Warfarin - Pharmacist Dosing Inpatient   Does not  apply q1800    Infusions: . sodium chloride 10 mL/hr at 05/02/19 0636  . sodium chloride      PRN Medications: sodium chloride, acetaminophen, docusate sodium, hydrALAZINE, labetalol, ondansetron (ZOFRAN) IV, oxyCODONE, promethazine, promethazine, sodium chloride flush  Assessment/Plan:    1. PAD/ischemic feet: S/p R transmetatarsal amputation on 8/25.  WBCs up to 27 post-op but afebrile, may be reactive.  WBCs now trending down slowly, 19.8 today.  He completed vancomycin for ?osteomyelitis on 8/19 (was getting with HD). Cultures NGTD. Left great toe dusky, now s/p left leg angiogram with SFA PCI.  There was 1 vessel runoff via left peroneal with severe multilevel stenosis => unable to intervene on peroneal.  - Currently off abx.  2. Chronic systolic CHF: Ischemic cardiomyopathy. St Jude ICD. NSTEMI in March 2018 with DES to LAD and RCA, complicated by cardiogenic shock, low output requiring milrinone. Echo 8/18 with EF 20-25%, moderate MR. CPX (8/18) with severe functional impairment due to HF. He is s/p HM3 LVAD placement. He had pump thrombosis 6/19 due to [facility] outflow graft kinking. He had pump exchange for another HM3. YIF0.2 today.  - Volume managed at dialysis, MWF.   - Continue ASA 81and warfarin with INR goal 2-2.5.  Can stop heparin gtt today. - He has been referred to [facility] for evaluation for heart/kidney transplantand recently saw Dr Mosetta Pigeon =>he is not candidate for transplantnowgiven extensive PAD.  - Consistently elevated MAP on non-HD days.  Continue hydralazine 25 mg tid on non-HD days, MAP improved. 3. ESRD: HD on M-W-F with midodrine pre-HD.  4.  CAD: NSTEMI in 3/18. LHC with 99% ulcerated lesion proximal RCA with left to right collaterals, 95% mid LAD stenosis after mid LAD stent. s/p PCI to RCA and LAD on 11/25/16. No chest pain.  - Continue statin and ASA. 5. h/o DVTs: Factor V Leiden heterozygote. -Anticoagulated. 6. Diabetic gastroparesis: He has seen  gastroparesis specialist at [facility]. Surprisingly, gastric emptying study was normal. However, electrogastrogram showed poor gastric accomodation/capacity. Therefore, small frequent meals recommended.Nausea earlier this admission, now resolved.  - Continue reglan 10 mg tid/ac and Marinol.  - Continue Protonix bid.  - He will continue erythromycinindefinitely.  - Limit narcotic use as much as possible.  6. DM:SSI and home insulin regimen.  7. YDX:AJOINOMV hydralazine on non-HD days.  8. Deconditioning: Continue PT.  PT recommend CIR, consult placed and  awaiting evaluation. He is ready to go to CIR if they are willing to take him.    I reviewed the LVAD parameters from today, and compared the results to the patient's prior recorded data.  No programming changes were made.  The LVAD is functioning within specified parameters.  The patient performs LVAD self-test daily.  LVAD interrogation was negative for any significant power changes, alarms or PI events/speed drops.  LVAD equipment check completed and is in good working order.  Back-up equipment present.   LVAD education done on emergency procedures and precautions and reviewed exit site care.  Length of Stay: [place], MD 05/06/2019, 7:52 AM  VAD Team --- VAD ISSUES ONLY--- Pager [phone] (7am - 7am)  Advanced Heart Failure Team  Pager [phone] (M-F; 7a - 4p)  Please contact [facility] for night-coverage after hours (4p -7a ) and weekends on [email or web address]

## 2019-05-06 NOTE — Progress Notes (Signed)
Physical Therapy Treatment Patient Details Name: [name] MRN: [id number] DOB: 07-29-1966 Today's Date: 05/06/2019    History of Present Illness 53 yo male admitted for ischemic foot pain with need for amputation => transmetatarsal amputation right foot on 8/25. PMH - LVAD 10/18 with emergent pump exchange 6/19, ESRD on HD, CHF, CAD, DM, cardiomyopathy    PT Comments    Pt admitted with above diagnosis. Pt was able to ambulate with RW 4 feet with min assist and chair follow.  Able to place weight on right heel.  PRogressing. Ready for Rehab.  Pt currently with functional limitations due to the balance and endurance deficits. Pt will benefit from skilled PT to increase their independence and safety with mobility to allow discharge to the venue listed below.     Follow Up Recommendations  CIR;Supervision/Assistance - 24 hour     Equipment Recommendations  Wheelchair (measurements PT);Wheelchair cushion (measurements PT)    Recommendations for Other Services Rehab consult     Precautions / Restrictions Precautions Precautions: Fall;Other (comment)(LVAD) Required Braces or Orthoses: Other Brace(Darko shoe) Other Brace: Darko shoe Restrictions Weight Bearing Restrictions: Yes RLE Weight Bearing: (weight bear on right heel only with Darko shoe) Other Position/Activity Restrictions:  pt to weight bear on rt heel     Mobility  Bed Mobility Overal bed mobility: Modified Independent             General bed mobility comments: Increased time for pain at RLE  Transfers Overall transfer level: Needs assistance Equipment used: Rolling walker (2 wheeled) Transfers: Sit to/from [facility] Sit to Stand: Min assist;+2 safety/equipment         General transfer comment: Assist for safety and verbal cues for hand placement. Pt able to place right foot on floor today.  Ambulation/Gait Ambulation/Gait assistance: Min assist;+2 safety/equipment Gait Distance  (Feet): 4 Feet Assistive device: Rolling walker (2 wheeled) Gait Pattern/deviations: Step-to pattern;Decreased stance time - right;Decreased weight shift to right Gait velocity: decr Gait velocity interpretation: <1.31 ft/sec, indicative of household ambulator General Gait Details: Assist for safety and lines. Pt weight bearing through rt heel today and progressed ambulation a few feet.     Stairs             Wheelchair Mobility    Modified Rankin (Stroke Patients Only)       Balance Overall balance assessment: Needs assistance Sitting-balance support: No upper extremity supported;Feet supported Sitting balance-[name]: Fair Sitting balance - Comments: Sitting at EOB and able to bend forward to pull up sock   Standing balance support: Bilateral upper extremity supported;During functional activity Standing balance-[name]: Poor Standing balance comment: Reliant on physical A and UE support.                            Cognition Arousal/Alertness: Awake/alert Behavior During Therapy: WFL for tasks assessed/performed Overall Cognitive Status: Within Functional Limits for tasks assessed                                 General Comments: Very motivated to participate in therapy and return to [facility]      Exercises      General Comments General comments (skin integrity, edema, etc.): Switched pt to battery for walk.       Pertinent Vitals/Pain Pain Assessment: Faces Faces Pain Scale: Hurts even more Pain Location: Right foot Pain Descriptors /  Indicators: Grimacing;Guarding;Aching Pain Intervention(s): Limited activity within patient's tolerance;Monitored during session;Repositioned;Premedicated before session    Home Living                      Prior Function            PT Goals (current goals can now be found in the care plan section) Acute Rehab PT Goals Patient Stated Goal: "I want to be able to walk independently  again" Progress towards PT goals: Progressing toward goals    Frequency    Min 3X/week      PT Plan Current plan remains appropriate    Co-evaluation              AM-PAC PT "6 Clicks" Mobility   Outcome Measure  Help needed turning from your back to your side while in a flat bed without using bedrails?: None Help needed moving from lying on your back to sitting on the side of a flat bed without using bedrails?: None Help needed moving to and from a bed to a chair (including a wheelchair)?: A Lot Help needed standing up from a chair using your arms (e.g., wheelchair or bedside chair)?: A Lot Help needed to walk in hospital room?: A Little Help needed climbing 3-5 steps with a railing? : Total 6 Click Score: 16    End of Session Equipment Utilized During Treatment: Gait belt Activity Tolerance: Patient limited by fatigue;Patient limited by pain Patient left: in chair;with call bell/phone within reach Nurse Communication: Mobility status PT Visit Diagnosis: Other abnormalities of gait and mobility (R26.89);Pain;Unsteadiness on feet (R26.81) Pain - Right/Left: Right Pain - part of body: Ankle and joints of foot     Time: 3338-3291 PT Time Calculation (min) (ACUTE ONLY): 24 min  Charges:  $Gait Training: 8-22 mins $Therapeutic Activity: 8-22 mins                     [place] Pager:  [phone]  Office:  [place] 05/06/2019, 4:16 PM

## 2019-05-06 NOTE — Progress Notes (Signed)
PT Cancellation Note  Patient Details Name: ENGLISH CRAIGHEAD MRN: [id number] DOB: [birth year]   Cancelled Treatment:    Reason Eval/Treat Not Completed: Patient at procedure or test/unavailable(Pt in HD.  Will return as able.  )   Denice Paradise 05/06/2019, 9:33 AM Amanda Cockayne Acute Rehabilitation Services Pager:  [phone]  Office:  [phone]

## 2019-05-06 NOTE — Progress Notes (Signed)
ANTICOAGULATION CONSULT NOTE - Follow Up Consult  Pharmacy Consult for Heparin>warfarin Indication: LVAD  Allergies  Allergen Reactions  . Metformin And Related Diarrhea    Patient Measurements: Height: 5' 7.01" (170.2 cm) Weight: 198 lb 3.1 oz (89.9 kg) IBW/kg (Calculated) : 66.12 Heparin Dosing Weight: 83.2 kg  Vital Signs: Temp: 98.1 F (36.7 C) (08/31 1149) Temp Source: Oral (08/31 1149) BP: 99/82 (08/31 1149) Pulse Rate: 78 (08/31 1149)  Labs: Recent Labs    05/04/19 0326 05/05/19 0352 05/06/19 0415  HGB 8.9* 8.7* 8.3*  HCT 28.9* 28.2* 26.5*  PLT 273 280 272  LABPROT 17.2* 19.9* 23.8*  INR 1.4* 1.7* 2.2*  HEPARINUNFRC 0.44 0.44 0.42  CREATININE  --  7.95* 10.09*    Estimated Creatinine Clearance: 8.9 mL/min (A) (by C-G formula based on SCr of 10.09 mg/dL (H)).   Assessment: 31 yom w/ hx Heartmate 3 LVAD, factor V Leiden, and PAF - presenting with severe pain (plan for amputation this week). On warfarin PTA - regimen was 5 mg daily except 7.5 mg on Tuesday; however, INR elevated on 8/18 so plan was for 2.5 mg on Tues, 5 mg on Wed and Thursday, hold Friday.   Underwent arteriogram with placement of L SFA stent on 8/27.  INR trending up to 2.2 today- warfarin restarted 8/27. No s/sx of bleeding. Heparin drip 1600 uts/hr HL 0.4 at goal  - will stop with INR at goal. No infusion issues per noted.   Goal of Therapy:  Heparin level 0.3-0.7 units/mL INR: 2-3 Monitor platelets by anticoagulation protocol: Yes   Plan:  Stop heparin Warfarin resume home dose 37m daily except 7.560mTTh Monitor daily  INR, CBC, and for s/sx of bleeding   LiBonnita Nasutiharm.D. CPP, BCPS Clinical Pharmacist [phone]/31/2020 2:30 PM

## 2019-05-06 NOTE — Progress Notes (Signed)
Weekly drive-line dressing changed using sterile technique. Next dsg change due 05/13/2019

## 2019-05-06 NOTE — Plan of Care (Signed)

## 2019-05-06 NOTE — Progress Notes (Addendum)
Admit: 04/26/2019 LOS: 10  52M ESRD w/ LVAD and ischemic R foot s/p TMA and LLE angiogram + stent  Subjective:   . No issues  08/30 0701 - 08/31 0700 In: 486.2 [P.O.:120; I.V.:366.2] Out: -   Filed Weights   05/04/19 0500 05/05/19 0300 05/06/19 0500  Weight: 86 kg 87.3 kg 90.1 kg    Scheduled Meds: . aspirin EC  81 mg Oral Daily  . atorvastatin  40 mg Oral q1800  . calcitRIOL  0.5 mcg Oral Q M,W,F  . Chlorhexidine Gluconate Cloth  6 each Topical Q0600  . citalopram  20 mg Oral Daily  . Darbepoetin Alfa      . darbepoetin (ARANESP) injection - DIALYSIS  150 mcg Intravenous Q Mon-HD  . erythromycin  250 mg Oral TID  . gabapentin  300 mg Oral BID  . hydrALAZINE  25 mg Oral 3 times per day on Sun Tue Thu Sat  . insulin aspart  0-9 Units Subcutaneous TID WC  . insulin aspart  7 Units Subcutaneous Q supper  . insulin glargine  10 Units Subcutaneous QHS  . LORazepam  0.5 mg Oral BID  . mouth rinse  15 mL Mouth Rinse BID  . metoCLOPramide  10 mg Oral TID WC  . midodrine      . midodrine  5 mg Oral Q M,W,F  . multivitamin  1 tablet Oral QHS  . sodium chloride flush  3 mL Intravenous Q12H  . traMADol  50 mg Oral Q6H  . Warfarin - Pharmacist Dosing Inpatient   Does not apply q1800   Continuous Infusions: . sodium chloride 10 mL/hr at 05/02/19 0636  . sodium chloride     PRN Meds:.sodium chloride, acetaminophen, docusate sodium, hydrALAZINE, labetalol, ondansetron (ZOFRAN) IV, oxyCODONE, promethazine, promethazine, sodium chloride flush  Current Labs: reviewed   Physical Exam:  Blood pressure 104/68, pulse 81, temperature 97.7 F (36.5 C), temperature source Oral, resp. rate 18, height 5' 7.01" (1.702 m), weight 90.1 kg, SpO2 96 %. NAD COnt hum from LVAD RRR NO LEE R TMA bandaged AAO x3 TDC L IJ  Dialysis:  MWF [facility]  4h 75mn  400/800   84kg  2K/3Ca  TDC  Hep none  - Hectoral 297m IV q HD - Mircera 20029mq 2 weeks (last 8/18) - Venofer 56m83m weekly  A 1.  R  foot ischemia/dry gangrene + ischemic L foot: s/p R TMA 8/25 and angio with stent of LLE 8/27.   2. ESRD: Continue HD MWF. Midodrine pre HD and at mid-way point prn MAP < 65 3. Hypertension/volume: BP controlled, UF limited 8/28 by IDH. Up 4-5 kg by wts. UF 3L today on HD 4.  Anemia:  Hgb down, resume Aranesp 150 with HD 8/31 5.  Metabolic bone disease: Ca OK, on C3 6.  CAD/ICM/LVAD: Per HF team 7.  Type 2 DM with known severe gastroparesis   Rob Kelly Splinter 05/06/2019, 11:13 AM    Recent Labs  Lab 05/03/19 0707 05/05/19 0352 05/06/19 0415  NA 134* 130* 130*  K 4.3 3.7 4.1  CL 97* 92* 92*  CO2 _0 GLUCOSE 164* 135* 112*  BUN 47* 44* 60*  CREATININE 9.00* 7.95* 10.09*  CALCIUM 8.9 8.9 8.7*  PHOS 5.3*  --   --    Recent Labs  Lab 05/04/19 0326 05/05/19 0352 05/06/19 0415  WBC 20.1* 19.6* 19.8*  HGB 8.9* 8.7* 8.3*  HCT 28.9* 28.2* 26.5*  MCV 93.8 93.1 91.7  PLT 273 280 272

## 2019-05-06 NOTE — Progress Notes (Signed)
Inpatient Rehabilitation Admissions Coordinator  Inpatient rehab consult received. I met with patient at bedside for rehab assessment. We discussed goals and expectations of an inpt rehab admit. He is in agreement. I will begin insurance approval with [employer] for a possible admit pending insurance approval. I will follow up tomorrow.  Danne Baxter, RN, MSN Rehab Admissions Coordinator [phone] 05/06/2019 1:48 PM

## 2019-05-06 NOTE — Progress Notes (Signed)
LVAD Coordinator Procedure Note: LVAD Coordinator Rounding Note:  Pt admitted per Dr. Haroldine Laws 04/25/19 for severe right foot pain. Plan for surgical intervention with Dr Donzetta Matters 04/30/19.   HM III LVAD implanted on 06/12/17 by Dr. Prescott Gum under Destination Therapy criteria due to renal insufficiency. Pump exchange on 03/01/18 for outflow graft twist/obstruction.   Patient laying in bed napping. Woke up while I was in room. States he is feeling "pretty good" and is looking forward to going to CIR. States that is pain is tolerable currently, but "really hurts" when he is out of bed.   Vital signs: Temp:  98.1 HR:  78 Doppler Pressure: 106 Auto BP:  99/82 (89) O2 Sat: 100% on RA Wt: 186>192>187>190>188>190>198.1 lbs  LVAD interrogation reveals:  Speed: 5500 Flow: 3.8 Power: 4.4w PI: 4.6 Alarms: none Events: rare Hematocrit: 30  Fixed speed: 5500 Low speed limit: 5200  Drive Line:  CDI., anchor intact and accurately applied. Weekly dressing kits. Next dressing change due 05/06/19, bedside nurse to change.   Labs: LDH trend: 195>177>170>170>168>148>152  INR trend: 1.6>1.8>1.8>1.6>1.4>1.5>1.4>2.2  WBC trend: 11.7>14.7>23.8>27.6>19.8  Infection:  Blood cx 08/22>> no growth - final  Anticoagulation Plan: - INR Goal: 2.0 - 3.0 - ASA Dose: 81 mg daily   Device: -St Jude dual  -Therapies: on 200 bpm - Monitor: on VT 150 bpm  Adverse Events on VAD: - ESRD post LVAD; started CVVH 06/15/17; HD started 06/20/17 - Hospitalization 11/5 - 07/16/17 > gastroparesis diagnosis after EGD - Hospitalization 11/26 - 08/04/17 > gastroparesis - Hospitalization 1/8 - 09/16/17>gastroparesis - referred to Dr. Corliss Parish at [facility] - 10/31/17>rightupper arm arteriovenous graft with Artegraft; ligation of right first stage brachial vein transposition  - 12/11/13 elective admission for thrombectomy for AV graft in RUA  - 12/20/17 > intractable N/V - 01/19/18 > intractable N/V sent by EMS from dialysis  center - 03/01/18 > outflow graft twist/occulsion requiring pump exchange - 05/23/18 > admitted for non-working fistula in RUA - 06/10/18 > admitted for intractable N/V - 08/08/18 > admitted for peri-op management for 2nd stage of AV fistula, found to be sclerotic. New LUE graft created from brachial artery to axillary vein. Developed spontaneous LUE hematoma.  - 08/22/18 > admitted for intractable N/V - 08/28/18> admitted for intractable N/V/D and low INR - Admitted 7/10-7/17> admitted for right foot ischemia- 4th toe amputation per Dr Donzetta Matters  - Admitted 03/26/29> admitted for N/V severe foot pain  - Admitted 04/25/19> admitted for right foot amputation vs BKA   Plan/Recommendations:  1. Please page VAD coordinator with any equipment questions or concerns.    [name] RN [place] Coordinator  Office: [phone]  24/7 Pager: [phone]

## 2019-05-06 NOTE — Progress Notes (Signed)
  Progress Note    05/06/2019 7:02 PM 4 Days Post-Op  Subjective: sleeping comfortably  Vitals:   05/06/19 1130 05/06/19 1149  BP: 103/86 99/82  Pulse: 77 78  Resp: 18 18  Temp: 97.7 F (36.5 C) 98.1 F (36.7 C)  SpO2:  100%    Physical Exam: Arouses easily  Right tma in tact, appears viable, warm to touch Left great toe demarcating  CBC    Component Value Date/Time   WBC 19.8 (H) 05/06/2019 0415   RBC 2.89 (L) 05/06/2019 0415   HGB 8.3 (L) 05/06/2019 0415   HGB 11.7 (L) 09/07/2016 1457   HCT 26.5 (L) 05/06/2019 0415   HCT 37.5 09/07/2016 1457   PLT 272 05/06/2019 0415   PLT 279 09/07/2016 1457   MCV 91.7 05/06/2019 0415   MCV 81 09/07/2016 1457   MCH 28.7 05/06/2019 0415   MCHC 31.3 05/06/2019 0415   RDW 16.7 (H) 05/06/2019 0415   RDW 14.9 09/07/2016 1457   LYMPHSABS 1.2 04/01/2019 1824   LYMPHSABS 1.3 09/07/2016 1457   MONOABS 1.3 (H) 04/01/2019 1824   EOSABS 0.4 04/01/2019 1824   EOSABS 0.2 09/07/2016 1457   BASOSABS 0.0 04/01/2019 1824   BASOSABS 0.0 09/07/2016 1457    BMET    Component Value Date/Time   NA 130 (L) 05/06/2019 0415   NA 144 05/11/2017 0938   K 4.1 05/06/2019 0415   CL 92 (L) 05/06/2019 0415   CO2 22 05/06/2019 0415   GLUCOSE 112 (H) 05/06/2019 0415   BUN 60 (H) 05/06/2019 0415   BUN 68 (H) 05/11/2017 0938   CREATININE 10.09 (H) 05/06/2019 0415   CREATININE 1.63 (H) 11/09/2016 1505   CALCIUM 8.7 (L) 05/06/2019 0415   CALCIUM 8.7 06/27/2017 1152   GFRNONAA 5 (L) 05/06/2019 0415   GFRNONAA 48 (L) 11/09/2016 1505   GFRAA 6 (L) 05/06/2019 0415   GFRAA 55 (L) 11/09/2016 1505    INR    Component Value Date/Time   INR 2.2 (H) 05/06/2019 0415     Intake/Output Summary (Last 24 hours) at 05/06/2019 1902 Last data filed at 05/06/2019 1130 Gross per 24 hour  Intake 146.79 ml  Output 3000 ml  Net -2853.21 ml     Assessment/plan  53 y.o. male is s/p right tma, left sfa stent on this admission. Allowing left great toe to  demarcate. Will continue to follow.   Druanne Bosques C. Donzetta Matters, MD [facility] Office: [phone] Pager: [phone]  05/06/2019 7:02 PM

## 2019-05-07 DIAGNOSIS — Z95810 Presence of automatic (implantable) cardiac defibrillator: Secondary | ICD-10-CM

## 2019-05-07 LAB — PROTIME-INR
INR: 2 — ABNORMAL HIGH (ref 0.8–1.2)
Prothrombin Time: 22.2 seconds — ABNORMAL HIGH (ref 11.4–15.2)

## 2019-05-07 LAB — GLUCOSE, CAPILLARY
Glucose-Capillary: 138 mg/dL — ABNORMAL HIGH (ref 70–99)
Glucose-Capillary: 152 mg/dL — ABNORMAL HIGH (ref 70–99)
Glucose-Capillary: 159 mg/dL — ABNORMAL HIGH (ref 70–99)
Glucose-Capillary: 174 mg/dL — ABNORMAL HIGH (ref 70–99)

## 2019-05-07 LAB — LACTATE DEHYDROGENASE: LDH: 167 U/L (ref 98–192)

## 2019-05-07 LAB — CBC
HCT: 29.5 % — ABNORMAL LOW (ref 39.0–52.0)
Hemoglobin: 9.2 g/dL — ABNORMAL LOW (ref 13.0–17.0)
MCH: 28.7 pg (ref 26.0–34.0)
MCHC: 31.2 g/dL (ref 30.0–36.0)
MCV: 91.9 fL (ref 80.0–100.0)
Platelets: 280 10*3/uL (ref 150–400)
RBC: 3.21 MIL/uL — ABNORMAL LOW (ref 4.22–5.81)
RDW: 17 % — ABNORMAL HIGH (ref 11.5–15.5)
WBC: 18.9 10*3/uL — ABNORMAL HIGH (ref 4.0–10.5)
nRBC: 0.2 % (ref 0.0–0.2)

## 2019-05-07 LAB — BASIC METABOLIC PANEL
Anion gap: 13 (ref 5–15)
BUN: 43 mg/dL — ABNORMAL HIGH (ref 6–20)
CO2: 23 mmol/L (ref 22–32)
Calcium: 9.2 mg/dL (ref 8.9–10.3)
Chloride: 96 mmol/L — ABNORMAL LOW (ref 98–111)
Creatinine, Ser: 7.7 mg/dL — ABNORMAL HIGH (ref 0.61–1.24)
GFR calc Af Amer: 8 mL/min — ABNORMAL LOW (ref >60)
GFR calc non Af Amer: 7 mL/min — ABNORMAL LOW (ref >60)
Glucose, Bld: 131 mg/dL — ABNORMAL HIGH (ref 70–99)
Potassium: 4 mmol/L (ref 3.5–5.1)
Sodium: 132 mmol/L — ABNORMAL LOW (ref 135–145)

## 2019-05-07 LAB — CULTURE, BLOOD (ROUTINE X 2)
Culture: NO GROWTH
Culture: NO GROWTH
Special Requests: ADEQUATE

## 2019-05-07 MED ORDER — HYDRALAZINE HCL 25 MG PO TABS: 25.0000 mg | ORAL_TABLET | Freq: Three times a day (TID) | ORAL | 6 refills | Status: DC

## 2019-05-07 MED ORDER — CHLORHEXIDINE GLUCONATE CLOTH 2 % EX PADS
6.0000 | MEDICATED_PAD | Freq: Every day | CUTANEOUS | Status: DC
  Administered 2019-05-08 – 2019-05-11 (×4): 6 via TOPICAL

## 2019-05-07 MED ORDER — WARFARIN SODIUM 5 MG PO TABS
5.0000 mg | ORAL_TABLET | ORAL | Status: DC
  Administered 2019-05-08: 5 mg via ORAL
  Filled 2019-05-07: qty 1

## 2019-05-07 MED ORDER — PANTOPRAZOLE SODIUM 40 MG PO TBEC: 40.0000 mg | DELAYED_RELEASE_TABLET | Freq: Two times a day (BID) | ORAL | 11 refills | Status: DC

## 2019-05-07 NOTE — Progress Notes (Addendum)
[facility]  Subjective  - Comfortable.   Objective (!) 123/99 69 98.3 F (36.8 C) (Oral) 15 100%  Intake/Output Summary (Last 24 hours) at 05/07/2019 1045 Last data filed at 05/06/2019 2132 Gross per 24 hour  Intake 3 ml  Output 3000 ml  Net -2997 ml    Right TMA appears to be viable, warm to touch Left GT no change demarcating    Assessment/Planning: S/P right TMA with ischemic left GT  We are watching the left GT and allowing it to demarcate. Cont. Current care.  Roxy Horseman 05/07/2019 10:45 AM --  Laboratory Lab Results: Recent Labs    05/06/19 0415 05/07/19 1005  WBC 19.8* 18.9*  HGB 8.3* 9.2*  HCT 26.5* 29.5*  PLT 272 280   BMET Recent Labs    05/06/19 0415 05/07/19 0423  NA 130* 132*  K 4.1 4.0  CL 92* 96*  CO2 22 23  GLUCOSE 112* 131*  BUN 60* 43*  CREATININE 10.09* 7.70*  CALCIUM 8.7* 9.2    COAG Lab Results  Component Value Date   INR 2.0 (H) 05/07/2019   INR 2.2 (H) 05/06/2019   INR 1.7 (H) 05/05/2019   No results found for: PTT   I have independently interviewed and examined patient and agree with PA assessment and plan above. Will intermittently continue to examine while in rehab and make decision for left great toe.  Brandon C. Donzetta Matters, MD [facility] Office: [phone] Pager: [phone]

## 2019-05-07 NOTE — Progress Notes (Addendum)
ANTICOAGULATION CONSULT NOTE - Follow Up Consult  Pharmacy Consult for Heparin>warfarin Indication: LVAD  Allergies  Allergen Reactions  . Metformin And Related Diarrhea    Patient Measurements: Height: 5' 7.01" (170.2 cm) Weight: 197 lb 5 oz (89.5 kg) IBW/kg (Calculated) : 66.12 Heparin Dosing Weight: 83.2 kg  Vital Signs: Temp: 98.1 F (36.7 C) (09/01 0300) Temp Source: Oral (09/01 0300) BP: 109/92 (09/01 0344) Pulse Rate: 69 (09/01 0300)  Labs: Recent Labs    05/05/19 0352 05/06/19 0415 05/07/19 0423  HGB 8.7* 8.3*  --   HCT 28.2* 26.5*  --   PLT 280 272  --   LABPROT 19.9* 23.8* 22.2*  INR 1.7* 2.2* 2.0*  HEPARINUNFRC 0.44 0.42  --   CREATININE 7.95* 10.09* 7.70*    Estimated Creatinine Clearance: 11.7 mL/min (A) (by C-G formula based on SCr of 7.7 mg/dL (H)).   Assessment: 53 yom w/ hx Heartmate 3 LVAD, factor V Leiden, and PAF on warfarin PTA admitted with ischemic foot now s/p transmetatarsal amputation on R foot 8/25 and stent 8/27. Pt has been resumed on home warfarin regimen, heparin bridge off 8/31. INR today is therapeutic at 2.0, LDH stable, no CBC today.  **Home regimen 5 mg daily except 7.5 mg on Tuesday/Thurs  Goal of Therapy:  Heparin level 0.3-0.7 units/mL INR: 2-3 Monitor platelets by anticoagulation protocol: Yes   Plan:  -Warfarin home regimen resumed - 28m daily except 7.517mTues/thurs -Daily protime   MiArrie SenatePharmD, BCPS Clinical Pharmacist [phone] check AMION for all MCRungeumbers 05/07/2019

## 2019-05-07 NOTE — Progress Notes (Addendum)
Advanced Heart Failure VAD Team Note  Patient Name: [name] Date of Encounter: 05/07/2019  Primary Cardiologist: Dr. Aundra Dubin  Subjective   Admitted for ischemic foot pain with need for amputation => transmetatarsal amputation right foot on 8/25.   Left leg angiogram 8/27: DES to left SFA.  1 vessel runoff via peroneal, severe disease with multilevel stenosis but unable to intervene.   Still with right foot pain but improving.   No dyspnea and no CP.   MAPs in the low 100s   LVAD INTERROGATION:  HeartMate 3 LVAD:   Flow 4.0 L  liters/min, speed 5550, power 4.4, PI 4.7.     Inpatient Medications    Scheduled Meds: . aspirin EC  81 mg Oral Daily  . atorvastatin  40 mg Oral q1800  . calcitRIOL  0.5 mcg Oral Q M,W,F  . Chlorhexidine Gluconate Cloth  6 each Topical Q0600  . citalopram  20 mg Oral Daily  . darbepoetin (ARANESP) injection - DIALYSIS  150 mcg Intravenous Q Mon-HD  . erythromycin  250 mg Oral TID  . gabapentin  300 mg Oral BID  . hydrALAZINE  25 mg Oral 3 times per day on Sun Tue Thu Sat  . insulin aspart  0-9 Units Subcutaneous TID WC  . insulin aspart  7 Units Subcutaneous Q supper  . insulin glargine  10 Units Subcutaneous QHS  . LORazepam  0.5 mg Oral BID  . mouth rinse  15 mL Mouth Rinse BID  . metoCLOPramide  10 mg Oral TID WC  . midodrine  5 mg Oral Q M,W,F  . multivitamin  1 tablet Oral QHS  . sodium chloride flush  3 mL Intravenous Q12H  . traMADol  50 mg Oral Q6H  . [START ON 05/08/2019] warfarin  5 mg Oral Once per day on Sun Mon Wed Fri Sat  . warfarin  7.5 mg Oral Once per day on Tue Thu  . Warfarin - Pharmacist Dosing Inpatient   Does not apply q1800   Continuous Infusions: . sodium chloride 10 mL/hr at 05/02/19 0636  . sodium chloride     PRN Meds: sodium chloride, acetaminophen, docusate sodium, hydrALAZINE, labetalol, ondansetron (ZOFRAN) IV, oxyCODONE, promethazine, promethazine, sodium chloride flush   Vital Signs     Vitals:   05/07/19 0011 05/07/19 0300 05/07/19 0344 05/07/19 0500  BP: (!) 114/100 (!) 109/92 (!) 109/92   Pulse:  69    Resp: 18 13    Temp: 99.1 F (37.3 C) 98.1 F (36.7 C)    TempSrc: Oral Oral    SpO2: 100% 98%    Weight:    89.5 kg  Height:        Intake/Output Summary (Last 24 hours) at 05/07/2019 0719 Last data filed at 05/06/2019 2132 Gross per 24 hour  Intake 3 ml  Output 3000 ml  Net -2997 ml   Last 3 Weights 05/07/2019 05/06/2019 05/06/2019  Weight (lbs) 197 lb 5 oz 198 lb 3.1 oz 198 lb 10.2 oz  Weight (kg) 89.5 kg 89.9 kg 90.1 kg      Telemetry    NSR 70s - Personally Reviewed  ECG    No EKGs to review - Personally Reviewed  Physical Exam   GEN: middle aged AAM in no acute distress.   Neck: Rt sided CVC line present, No JVD Cardiac: LVAD hum heard throughout the precordium   Respiratory: faint crackles at the bases, R>L GI: Soft, nontender, non-distended  MS: No edema; s/p  rt transmetatarsal amputation, stump healing well, sutures intact. No visible drainage.  Neuro:  Nonfocal  Psych: Normal affect   Labs    High Sensitivity Troponin:  No results for input(s): TROPONINIHS in the last 720 hours.    Chemistry Recent Labs  Lab 05/03/19 0707 05/05/19 0352 05/06/19 0415 05/07/19 0423  NA 134* 130* 130* 132*  K 4.3 3.7 4.1 4.0  CL 97* 92* 92* 96*  CO2 25 25 22 23   GLUCOSE 164* 135* 112* 131*  BUN 47* 44* 60* 43*  CREATININE 9.00* 7.95* 10.09* 7.70*  CALCIUM 8.9 8.9 8.7* 9.2  ALBUMIN 2.3*  --   --   --   GFRNONAA 6* 7* 5* 7*  GFRAA 7* 8* 6* 8*  ANIONGAP 12 13 16* 13     Hematology Recent Labs  Lab 05/04/19 0326 05/05/19 0352 05/06/19 0415  WBC 20.1* 19.6* 19.8*  RBC 3.08* 3.03* 2.89*  HGB 8.9* 8.7* 8.3*  HCT 28.9* 28.2* 26.5*  MCV 93.8 93.1 91.7  MCH 28.9 28.7 28.7  MCHC 30.8 30.9 31.3  RDW 17.0* 16.9* 16.7*  PLT 273 280 272    BNPNo results for input(s): BNP, PROBNP in the last 168 hours.   DDimer No results for input(s):  DDIMER in the last 168 hours.   Radiology    No results found.  Cardiac Studies   No new cardiac studies this admit  Patient Profile     53 year old withCAD s/p anterior MI in 2010 and NSTEMI in 3/18 with DES to pRCA and mLAD, gastroparesis,ischemic cardiomyopathy now s/p Heartmate 3 LVAD, chronic anticoagulation therapy w/ coumadin, ESRD on HD, factor V leiden, PAF and PVD admitted w/ critical limb ischemia involving the RLE, ultimately undergoing transmetatarsal amputation right foot on 8/25.  Assessment & Plan    1. PAD/ischemic feet: S/p R transmetatarsal amputation on 8/25.  WBCs up to 27 post-op but afebrile, may be reactive.  WBCs have  Been trending down slowly>> 19.8. He completed vancomycin for ?osteomyelitis on 8/19 (was getting with HD). Cultures NGTD. Left great toe dusky, now s/p left leg angiogram with SFA PCI.  There was 1 vessel runoff via left peroneal with severe multilevel stenosis => unable to intervene on peroneal.  - Currently off abx.  - Rt foot stump healing well, sutures intact, no visible drainage.   2. Chronic systolic CHF: Ischemic cardiomyopathy. St Jude ICD. NSTEMI in March 2018 with DES to LAD and RCA, complicated by cardiogenic shock, low output requiring milrinone. Echo 8/18 with EF 20-25%, moderate MR. CPX (8/18) with severe functional impairment due to HF. He is s/p HM3 LVAD placement. He had pump thrombosis 6/19 due to [facility] outflow graft kinking. He had pump exchange for another HM3. INR2.0 today.  - Volume managed at dialysis, MWF.   - Continue ASA 81and warfarin with INR goal 2-2.5.   - He has been [facility] to [facility] for evaluation for heart/kidney transplantand recently saw Dr Mosetta Pigeon =>he is not candidate for transplantnowgiven extensive PAD. - Consistently elevated MAP on non-HD days, low 100s today.  Continue hydralazine 25 mg tid on non-HD days.  3. ESRD: HD on M-W-F with midodrine pre-HD.   4.CAD: NSTEMI in 3/18. LHC with 99%  ulcerated lesion proximal RCA with left to right collaterals, 95% mid LAD stenosis after mid LAD stent. s/p PCI to RCA and LAD on 11/25/16.No chest pain.  - Continue statin and ASA.  5. h/o DVTs: Factor V Leiden heterozygote. -Anticoagulated. On coumadin. INR therapeutic  at 2.0.   6. Diabetic gastroparesis: He has seen gastroparesis specialist at [facility]. Surprisingly, gastric emptying study was normal. However, electrogastrogram showed poor gastric accomodation/capacity. Therefore, small frequent meals recommended.Nausea earlier this admission, now resolved.  - Continue reglan 10 mg tid/ac and Marinol.  - Continue Protonix bid.  - He will continue erythromycinindefinitely.  - Limit narcotic use as much as possible.   6. CN:OBSJ glycemic control w/ recent hgb A1c at 6.6. SSI and home insulin regimen.  7. GGE:ZMOQ remain elevated on non HD days in the low 100s. Continue hydralazine on non-HD days.   8. Deconditioning: Continue PT.  PT recommends CIR. Consult place. He is awaiting insurance approval.    For questions or updates, please contact [place] Please consult [email or web address] for contact info under   Signed, Lyda Jester, PA-C  05/07/2019, 7:19 AM    Patient seen with PA, agree with the above note.    INR therapeutic today.  Right foot TMA wound appears stable.  He feels mildly "queasy" this morning, suspect this is his usual gastroparesis symptomatology.  - Will give a dose of IV Phenergan.   LVAD parameters stable.    MAP around 100 but will be getting hydralazine today (non-HD day).   [name] 05/07/2019 8:10 AM

## 2019-05-07 NOTE — NC FL2 (Signed)
[place] LEVEL OF CARE SCREENING TOOL     IDENTIFICATION  Patient Name: [name] Birthdate: 17-Jul-1966 Sex: male Admission Date (Current Location): 04/26/2019  [facility] and [place] Number:  Herbalist and Address:  [facility] [place], [place] [address] [place], [place] [address]      Provider Number: [id number]  Attending Physician Name and Address:  Larey Dresser, MD  Relative Name and Phone Number:  Margreta Journey (DDUKGURK)[phone]    Current Level of Care: Hospital Recommended Level of Care: [place] Prior Approval Number:    Date Approved/Denied:   PASRR Number: [id number]  Discharge Plan: SNF    Current Diagnoses: Patient Active Problem List   Diagnosis Date Noted  . Hospital discharge follow-up 04/15/2019  . Back pain 04/15/2019  . Warfarin anticoagulation 04/15/2019  . Ischemic pain of right foot 03/15/2019  . Ischemic foot 03/15/2019  . Subtherapeutic international normalized ratio (INR) 08/28/2018  . Acute vomiting   . Heart failure ([place]) 05/23/2018  . Hypertensive urgency 03/25/2018  . Benign essential HTN   . Crohn's disease with complication ([place])   . Diabetes mellitus type 2 in nonobese (HCC)   . Anemia of chronic disease   . Sinus tachycardia   . Left ventricular assist device (LVAD) complication 37/06/6268  . Left ventricular assist device (LVAD) complication, initial encounter 02/28/2018  . Factor V Leiden carrier ([place]) 01/10/2018  . History of creation of ostomy ([place]) 01/10/2018  . ESRD (end stage renal disease) ([place] ) 12/11/2017  . Left ventricular assist device present ([place]) 12/05/2017  . ESRD (end stage renal disease) on dialysis ([place]) 10/29/2017  . Intractable nausea and vomiting 07/31/2017  . 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) ([place]) 07/10/2017  . LVAD (left ventricular assist device) present ([place])   . Palliative care by specialist   . Chronic systolic CHF  (congestive heart failure) ([place]) 06/07/2017  . ACP (advance care planning)   . Encounter for central line placement   . Palliative care encounter   . CHF exacerbation ([place]) 04/05/2017  . Diabetic keto-acidosis ([place]) 04/05/2017  . ESRD on dialysis ([place]) 01/17/2017  . AKI (acute kidney injury) ([place])   . Non-ST elevation (NSTEMI) myocardial infarction ([place])   . CHF (congestive heart failure) ([place]) 09/02/2016  . Elevated serum creatinine   . Encounter for therapeutic drug monitoring 08/03/2016  . Chest pain 04/26/2012  . SVT (supraventricular tachycardia) ([place]) 04/26/2012  . Gastroparesis 11/18/2011  . Coronary artery disease   . Ischemic cardiomyopathy   . Essential hypertension   . Automatic implantable cardioverter-defibrillator in situ   . Deep venous thrombosis ([place])   . POLYP, COLON 09/25/2008  . Dyslipidemia, goal LDL below 70 09/25/2008  . GASTRITIS 09/25/2008  . DIVERTICULOSIS, COLON 09/25/2008  . History of Crohn's disease 09/05/2006    Orientation RESPIRATION BLADDER Height & Weight     Self, Time, Situation, Place    Continent Weight: 197 lb 5 oz (89.5 kg) Height:  5' 7.01" (170.2 cm)  BEHAVIORAL SYMPTOMS/MOOD NEUROLOGICAL BOWEL NUTRITION STATUS      Continent Diet(see discharge summary)  AMBULATORY STATUS COMMUNICATION OF NEEDS Skin     Verbally Other (Comment), Surgical wounds(right toe amputation, right abdomen catheter entry/exit location)                       Personal Care Assistance Level of Assistance              Functional  Limitations Info  Sight, Hearing, Speech Sight Info: Impaired Hearing Info: Adequate Speech Info: Adequate    SPECIAL CARE FACTORS FREQUENCY  PT (By licensed PT), OT (By licensed OT)     PT Frequency: min 5x weekly OT Frequency: min 5x weekly            Contractures Contractures Info: Not present    Additional Factors Info  Code Status, Allergies Code Status Info: Partial Allergies Info: Metformin and  related           Current Medications (05/07/2019):  This is the current hospital active medication list Current Facility-Administered Medications  Medication Dose Route Frequency Provider Last Rate Last Dose  . 0.9 %  sodium chloride infusion   Intravenous Continuous Marty Heck, MD 10 mL/hr at 05/02/19 0636    . 0.9 %  sodium chloride infusion  250 mL Intravenous PRN Marty Heck, MD      . acetaminophen (TYLENOL) tablet 650 mg  650 mg Oral Q4H PRN Marty Heck, MD   650 mg at 04/30/19 1948  . aspirin EC tablet 81 mg  81 mg Oral Daily Marty Heck, MD   81 mg at 05/07/19 1136  . atorvastatin (LIPITOR) tablet 40 mg  40 mg Oral q1800 Marty Heck, MD   40 mg at 05/06/19 1716  . calcitRIOL (ROCALTROL) capsule 0.5 mcg  0.5 mcg Oral Q M,W,F Marty Heck, MD   0.5 mcg at 05/07/19 1137  . Chlorhexidine Gluconate Cloth 2 % PADS 6 each  6 each Topical Q0600 Marty Heck, MD   6 each at 05/07/19 [phone]  . [START ON 05/08/2019] Chlorhexidine Gluconate Cloth 2 % PADS 6 each  6 each Topical Q0600 Roney Jaffe, MD      . citalopram (CELEXA) tablet 20 mg  20 mg Oral Daily Marty Heck, MD   20 mg at 05/07/19 1137  . Darbepoetin Alfa (ARANESP) injection 150 mcg  150 mcg Intravenous Q Mon-HD Pearson Grippe B, MD   150 mcg at 05/06/19 1056  . docusate sodium (COLACE) capsule 100 mg  100 mg Oral Daily PRN Marty Heck, MD   100 mg at 05/07/19 1136  . erythromycin (E-MYCIN) tablet 250 mg  250 mg Oral TID Marty Heck, MD   250 mg at 05/07/19 1136  . gabapentin (NEURONTIN) tablet 300 mg  300 mg Oral BID Marty Heck, MD   300 mg at 05/07/19 1660  . hydrALAZINE (APRESOLINE) injection 5 mg  5 mg Intravenous Q20 Min PRN Marty Heck, MD      . hydrALAZINE (APRESOLINE) tablet 25 mg  25 mg Oral 3 times per day on Sun Tue Thu Sat Marty Heck, MD   25 mg at 05/07/19 1140  . insulin aspart (novoLOG) injection 0-9 Units   0-9 Units Subcutaneous TID WC Marty Heck, MD   2 Units at 05/07/19 1144  . insulin aspart (novoLOG) injection 7 Units  7 Units Subcutaneous Q supper Marty Heck, MD   7 Units at 05/06/19 1718  . insulin glargine (LANTUS) injection 10 Units  10 Units Subcutaneous QHS Marty Heck, MD   10 Units at 05/06/19 2132  . labetalol (NORMODYNE) injection 10 mg  10 mg Intravenous Q10 min PRN Marty Heck, MD      . LORazepam (ATIVAN) tablet 0.5 mg  0.5 mg Oral BID Marty Heck, MD   0.5 mg at 05/07/19 0834  . MEDLINE  mouth rinse  15 mL Mouth Rinse BID Marty Heck, MD   15 mL at 05/05/19 7530  . metoCLOPramide (REGLAN) tablet 10 mg  10 mg Oral TID WC Marty Heck, MD   10 mg at 05/07/19 1140  . midodrine (PROAMATINE) tablet 5 mg  5 mg Oral Q M,W,F Marty Heck, MD   5 mg at 05/06/19 1056  . multivitamin (RENA-VIT) tablet 1 tablet  1 tablet Oral QHS Marty Heck, MD   1 tablet at 05/06/19 2132  . ondansetron (ZOFRAN) injection 4 mg  4 mg Intravenous Q6H PRN Marty Heck, MD   4 mg at 04/29/19 0517  . oxyCODONE (Oxy IR/ROXICODONE) immediate release tablet 5 mg  5 mg Oral Q6H PRN Marty Heck, MD   5 mg at 05/03/19 1313  . promethazine (PHENERGAN) injection 12.5-25 mg  12.5-25 mg Intravenous Q6H PRN Marty Heck, MD   25 mg at 05/07/19 0829  . promethazine (PHENERGAN) tablet 12.5 mg  12.5 mg Oral Q6H PRN Marty Heck, MD      . sodium chloride flush (NS) 0.9 % injection 3 mL  3 mL Intravenous Q12H Marty Heck, MD   3 mL at 05/07/19 1144  . sodium chloride flush (NS) 0.9 % injection 3 mL  3 mL Intravenous PRN Marty Heck, MD      . traMADol Veatrice Bourbon) tablet 50 mg  50 mg Oral Q6H Marty Heck, MD   50 mg at 05/07/19 0511  . [START ON 05/08/2019] warfarin (COUMADIN) tablet 5 mg  5 mg Oral Once per day on Sun Mon Wed Fri Sat Einar Grad, [facility]      . warfarin (COUMADIN) tablet 7.5 mg   7.5 mg Oral Once per day on Tue Thu Focht, Dalton S, MD      . Warfarin - Pharmacist Dosing Inpatient   Does not apply q1800 Larey Dresser, MD         Discharge Medications: Please see discharge summary for a list of discharge medications.  Relevant Imaging Results:  Relevant Lab Results:   Additional Information SSN: [id number]  Alberteen Sam, LCSW

## 2019-05-07 NOTE — Progress Notes (Signed)
PT Cancellation Note  Patient Details Name: [name] MRN: [id number] DOB: [birth year]   Cancelled Treatment:    Reason Eval/Treat Not Completed: Medical issues which prohibited therapy(Nurse gave nausea med and pt lethargic. Will return as able.)   Denice Paradise 05/07/2019, 10:15 AM Marjani Kobel,PT Acute Rehabilitation Services Pager:  [phone]  Office:  [phone]

## 2019-05-07 NOTE — Progress Notes (Addendum)
Inpatient Rehabilitation Admissions Coordinator  I have been notified by pt's [place] that pt does not have the benefit for an inpt rehab admission/does have SNF benefit. I have asked to speak to their supervisor to clarify further. I have met with patient at bedside as well as alerted RN of barrier to admit to CIR. I will follow up once I have final dispo clarified.  Danne Baxter, RN, MSN Rehab Admissions Coordinator [phone] 05/07/2019 1:28 PM  I met with patient at bedside and explained that his policy does not cover any hospital inpt rehab facilities. They do have SNF coverage. He is in agreement to SNF. I contacted RN CM, Neoma Laming , and we will sign off for he does not have the coverage. Please call me with any questions. I conveyed this information to pt's RN ,Marita [place], and she is updating Cardiology service.  Danne Baxter, RN, MSN Rehab Admissions Coordinator [phone] 05/07/2019 2:43 PM

## 2019-05-07 NOTE — Progress Notes (Signed)
Admit: 04/26/2019 LOS: 39  28M ESRD w/ LVAD and ischemic R foot s/p TMA and LLE angiogram + stent  Subjective:   . No issues, no SOB  08/31 0701 - 09/01 0700 In: 3 [I.V.:3] Out: 3000   Filed Weights   05/06/19 0500 05/06/19 0750 05/07/19 0500  Weight: 90.1 kg 89.9 kg 89.5 kg    Scheduled Meds: . aspirin EC  81 mg Oral Daily  . atorvastatin  40 mg Oral q1800  . calcitRIOL  0.5 mcg Oral Q M,W,F  . Chlorhexidine Gluconate Cloth  6 each Topical Q0600  . citalopram  20 mg Oral Daily  . darbepoetin (ARANESP) injection - DIALYSIS  150 mcg Intravenous Q Mon-HD  . erythromycin  250 mg Oral TID  . gabapentin  300 mg Oral BID  . hydrALAZINE  25 mg Oral 3 times per day on Sun Tue Thu Sat  . insulin aspart  0-9 Units Subcutaneous TID WC  . insulin aspart  7 Units Subcutaneous Q supper  . insulin glargine  10 Units Subcutaneous QHS  . LORazepam  0.5 mg Oral BID  . mouth rinse  15 mL Mouth Rinse BID  . metoCLOPramide  10 mg Oral TID WC  . midodrine  5 mg Oral Q M,W,F  . multivitamin  1 tablet Oral QHS  . sodium chloride flush  3 mL Intravenous Q12H  . traMADol  50 mg Oral Q6H  . [START ON 05/08/2019] warfarin  5 mg Oral Once per day on Sun Mon Wed Fri Sat  . warfarin  7.5 mg Oral Once per day on Tue Thu  . Warfarin - Pharmacist Dosing Inpatient   Does not apply q1800   Continuous Infusions: . sodium chloride 10 mL/hr at 05/02/19 0636  . sodium chloride     PRN Meds:.sodium chloride, acetaminophen, docusate sodium, hydrALAZINE, labetalol, ondansetron (ZOFRAN) IV, oxyCODONE, promethazine, promethazine, sodium chloride flush  Current Labs: reviewed   Physical Exam:  Blood pressure (!) 116/99, pulse 72, temperature 98.1 F (36.7 C), temperature source Oral, resp. rate 11, height 5' 7.01" (1.702 m), weight 89.5 kg, SpO2 98 %. NAD COnt hum from LVAD RRR NO LEE R TMA bandaged AAO x3 TDC L IJ  Dialysis:  MWF [facility]  4h 23mn  400/800   84kg  2K/3Ca  TDC  Hep none - Hectoral 22m IV q  HD - Mircera [id number] 2 weeks (last 8/18) - Venofer 52m73m weekly  A 1.  R foot ischemia/dry gangrene + ischemic L foot: s/p R TMA 8/25 and angio with stent of LLE 8/27.   2. ESRD: Continue HD MWF. Midodrine pre HD and midHD , keep MAP > 65  3. Hypertension/volume: BP controlled, remains vol up by 5kg+ 4.  Anemia:  Hgb down, resume Aranesp 150 with HD 8/31 5.  Metabolic bone disease: Ca OK, on C3 6.  CAD/ICM/LVAD: Per HF team 7.  Type 2 DM with known severe gastroparesis  Plan - HD tomorrow, max UF , get vol down  [name] 05/07/2019, 3:32 PM    Recent Labs  Lab 05/03/19 0707 05/05/19 0352 05/06/19 0415 05/07/19 0423  NA 134* 130* 130* 132*  K 4.3 3.7 4.1 4.0  CL 97* 92* 92* 96*  CO2 25 25 22 23   GLUCOSE 164* 135* 112* 131*  BUN 47* 44* 60* 43*  CREATININE 9.00* 7.95* 10.09* 7.70*  CALCIUM 8.9 8.9 8.7* 9.2  PHOS 5.3*  --   --   --  Recent Labs  Lab 05/05/19 0352 05/06/19 0415 05/07/19 1005  WBC 19.6* 19.8* 18.9*  HGB 8.7* 8.3* 9.2*  HCT 28.2* 26.5* 29.5*  MCV 93.1 91.7 91.9  PLT 280 272 280

## 2019-05-07 NOTE — Discharge Summary (Addendum)
Discharge Summary    Patient ID: [name],  MRN: [id number], DOB/AGE: 1966/01/12 53 y.o.  Admit date: 04/26/2019 Discharge date: 05/07/2019  Primary Care Provider: Richarda [name] Primary Cardiologist: No primary care provider on file. [name]: [name]   Discharge Diagnoses    Principal Problem:   Ischemic foot Active Problems:   Automatic implantable cardioverter-defibrillator in situ   Gastroparesis   ESRD on dialysis [facility])   LVAD (left ventricular assist device) present (HCC)   Allergies Allergies  Allergen Reactions   Metformin And Related Diarrhea    Diagnostic Studies/Procedures    04/27/2019 LVAD INTERROGATION:  HeartMate 3 LVAD:   Flow 3.9L  liters/min, speed 5500, power 3.9, PI 6.4.    05/07/2019 LVAD INTERROGATION:  HeartMate 3 LVAD:  Flow 4.0 L liters/min, speed 5550, power 4.4, PI 4.7.   04/30/2019 Procedure Performed:  Right transmetatarsal amputation  Indications: 53 year old male history of right lower extremity revascularization fourth toe amputation which is now failed to heal.  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  Findings: There is adequate bleeding on the dorsum of the foot as well as plantar flap.              Procedure:  The patient was identified in the holding area and taken to the operating was placed supine operative MAC anesthesia induced.  Sterilely prepped and draped in the right foot in usual fashion antibiotics were minister timeout called.  We began with anesthetizing foot with 1% lidocaine with epinephrine and quarter percent Marcaine.  We drew out a fishmouth type incision traces with 10 blade down to level of the bones.  We resected back the cuboid and cuneiform bone.  We created a plantar flap.  Bones were removed.  We rongeured the most medial smoothed with rasp.  We irrigated with heparinized saline.  We approximated the skin with interrupted 3-0 nylon  sutures.  Sterile dressing was placed.  He tolerated procedure well, no immediate complication.  05/02/2019 Pre-operative Diagnosis:Critical limb ischemia left lower extremity with tissue loss Post-operative diagnosis:Same Surgeon:[name] Addison Naegeli, [name] Procedure Performed: 1.Ultrasound-guided access of the right common femoral artery 2.Left lower extremity arteriogram with selection of third order branches 3.Left SFA stent (6 mm x 60 mm drug-coatedEluviastentpostdilated with a 5 mm Mustang) 4.Mynx closure of the right common femoral artery 5.29mnutes of monitored moderate conscious sedationtime  Indications:Patient is a [age]year old male who has extensive medical history that presented with tissue loss in his lower extremities. He underwent right lower extremity interventionwith Dr. CIvette Loyalin the week. He presents today for left lower extremity intervention in the setting of SFA stenosis as well as single-vessel peroneal diseasedrunoff.  Findings:Aortogram was deferred since it was performed earlier in the week. Dedicated left lower extremity arteriogram showed approximate 50% stenosis in the mid SFA. Patient only had single runoff via the peroneal artery that appeared inline down to the foot but was heavily diseased throughout multiple segments with heavily calcified multifocal high-grade stenosis and/or short occlusions.  Ultimately cannulated the left peroneal with a V 18 and attempted to get down to the ankle. Unfortunately in the mid calf I could notget my wire down the true lumen of the vessel and was ina subintimal plane and also had a small perforation. I tried multiple wires and catheters including an 014 and still could not get down the true lumen of the artery distal to this lesion. Possible short focal occlusion. Ultimately elected  to stop given single-vessel runoff. The left SFA was then treated with a 6 mm x 60 mm drug-coated stent  andpostdilated with a 5 mm balloon. He had preserved single-vessel peroneal runoff atthe end of thecase. _____________   History of Present Illness     [name] is a 53 year old withCAD s/p anterior MI in 2010 and NSTEMI in 3/18 with DES to pRCA and mLAD, gastroparesis,ischemic cardiomyopathy now s/p Heartmate 3 LVAD and ESRD on HD MWF, factor V Leiden heterozygote on coumadin, PAF, and ischemic foot.He has had multiple admits for N/V.  [place] ICD.   NSTEMI in March 2018 with DES to LAD and RCA, complicated by cardiogenic shock, low output requiring milrinone. Echo 04/2017 with EF 20-25%, moderate MR. CPX (8/18) with severe functional impairment due to HF. He is s/p HM3 LVAD placement. He had pump thrombosis 02/2018 due to [facility] outflow graft kinking. He had pump exchange for another HM3.  He has been evaluated by [facility] transplant team, not a candidate given extensive PAD.  Admitted 03/15/2019 with ischemic right foot. VVS consulted and on 7/13 he underwent peripheral angiogram that showed 80% stenosis r SFV and occluded right PT and had stent to right SFA and angioplasty to PT. He then had 4th toe amputation. He was discharged to home on 03/22/19.  Admitted 03/27/19 withrecurrent n/v, leukocytosis and ischemic changes in right foot. Admit ICU for treatment of severe gastroparesisand possible ischemic foot. VVS , Nephrology, ID consulted. He was treated with IV vancomycin for possible osteomyelitis/infection, but thought that more likely ischemia could explain all the findings. He was discharged home to followup with vascular surgery.   He has been followed by Dr Donzetta Matters with plans for amputation next week. Today he was at dialysis and once he completed his session he developed severe pain in his right foot. Says he took oxycodone earlier today and has had not relief. He denies SOB/nasea/vomiting. Denies BRBPR. Unable to place weight on his right foot.   He was admitted for further  evaluation and treatment.  Hospital Course     Consultants: Dr. Servando Snare, VVS; Dr. Roney Jaffe, Nephrology  1.  CHF: Dr. Haroldine Laws inserted central line to help with meds and fluid administration.  Initially, there was some bleeding from the site.  However, this resolved with direct pressure and as his Coumadin wore off.  His intake/output, daily weights and LVAD parameters were followed closely.   LVAD interrogation was negative for any significant power changes, alarms or PI events/speed drops.  LVAD equipment check completed and is in good working order.  Back-up equipment present.   LVAD education done on emergency procedures and precautions and reviewed exit site care. Volume management was by dialysis.  He takes hydralazine on nondialysis days.  2. ischemic right foot:  He was seen by Dr. Donzetta Matters with VVS on 8/24.  He was noted to have a good right PT signal at the ankle.  Right transmetatarsal amputation with possible right BKA was planned.  After that, he recommended left lower extremity angiography and possible left SFA stenting prior to left great toe amputation.  He had problems with night sweats and diaphoresis, low-grade temps as well as leukocytosis.  However, blood cultures were negative x2.  Sed rate was elevated at 79.  Right transmetatarsal amputation was performed on 8/25.  He tolerated the procedure well.  Pain control was with scheduled tramadol and as needed oxycodone.  He was taken to the lab on 8/27 for left  lower extremity angiogram. Procedure results are above, he had left SFA PCI.  There was severe multilevel stenosis in the left peroneal, but he was unable to intervene.  He had been on vancomycin for possible osteomyelitis but completed that.  The surgical team continue to follow him during his hospital stay.  They are watching the left great toe which is ischemic and allowing it to demarcate.  The right TMA was doing well.  His WBCs have been up to 27 postop, but he  remained afebrile postop.  It was felt this may be reactive and his white count has been trending down slowly.  3.  Chronic anticoagulation with Coumadin: His Coumadin was held and he was started on heparin.  After he stabilized, Coumadin was restarted.  By discharge, his INR was therapeutic.  4.  ESRD on HD: Nephrology was consulted and managed his dialysis.  He takes midodrine on dialysis days and this was continued.  He continued to be followed by Nephrology and there were no complications.  5.  Diabetic gastroparesis: He was evaluated at [facility] and he has poor gastric accommodation/capacity.  He also had a normal gastric emptying study.  Small frequent meals are recommended and he is to stay on Reglan 10 mg 3 times daily/AC and Marinol as well as Protonix twice daily and erythromycin indefinitely.  Narcotics are to be limited as much as possible.  He was getting as needed Phenergan for nausea.  6.  CAD: He had a non-STEMI in March 2018, 99% RCA ulcerated lesion in 95% mid LAD stenosis were both treated with DES.  He is on statin and ASA, continue these  7.  Diabetes: Recent hemoglobin A1c was 6.6.  He was maintained on his home insulin regimen plus sliding scale insulin.  8.  Postop difficulties with ambulation and deconditioning: He was seen by physical therapy and Occupational Therapy.  CIR was recommended.  He was seen by the rehab admission coordinator and accepted.  He will be discharged to rehab from this admission.  On 9/1, Mr. Noteboom was evaluated by Dr. Aundra Dubin and all data were reviewed.  No further inpatient work-up was indicated and he is considered stable for discharge to rehab, to continue his recovery.  _____________  Discharge Vitals Blood pressure (!) 122/95, pulse 74, temperature 98.2 F (36.8 C), temperature source Oral, resp. rate 13, height 5' 7.01" (1.702 m), weight 89.5 kg, SpO2 99 %.  Filed Weights   05/06/19 0500 05/06/19 0750 05/07/19 0500  Weight: 90.1 kg  89.9 kg 89.5 kg    Labs & Radiologic Studies    CBC Recent Labs    05/06/19 0415 05/07/19 1005  WBC 19.8* 18.9*  HGB 8.3* 9.2*  HCT 26.5* 29.5*  MCV 91.7 91.9  PLT 272 818   Basic Metabolic Panel Recent Labs    05/06/19 0415 05/07/19 0423  NA 130* 132*  K 4.1 4.0  CL 92* 96*  CO2 22 23  GLUCOSE 112* 131*  BUN 60* 43*  CREATININE 10.09* 7.70*  CALCIUM 8.7* 9.2   Liver Function Tests Hepatic Function Panel     Component Value Date/Time   PROT 6.4 (L) 04/30/2019 0346   ALBUMIN 2.3 (L) 05/03/2019 0707   AST 15 04/30/2019 0346   ALT 12 04/30/2019 0346   ALKPHOS 86 04/30/2019 0346   BILITOT 0.6 04/30/2019 0346   BILIDIR 0.2 07/14/2017 0327   IBILI 0.5 07/14/2017 0327   Lab Results  Component Value Date   INR 2.0 (H)  05/07/2019   INR 2.2 (H) 05/06/2019   INR 1.7 (H) 05/05/2019    _____________  Dg Chest Port 1 View  Result Date: 05/02/2019 CLINICAL DATA:  Preop chest exam.  Coronary artery disease. EXAM: PORTABLE CHEST 1 VIEW COMPARISON:  Radiographs of April 26, 2019. FINDINGS: Stable cardiomegaly. Left ventricular assist device is unchanged in position. Left-sided pacemaker is unchanged in position. Left internal jugular dialysis catheter is unchanged in position. Right internal jugular catheter is stable. No pneumothorax is noted. Left lung is clear. Elevated right hemidiaphragm is noted with mild right basilar subsegmental atelectasis. Bony thorax is unremarkable. IMPRESSION: Stable support apparatus. Stable elevated right hemidiaphragm is noted with mild right basilar subsegmental atelectasis. Electronically Signed   By: Marijo Conception M.D.   On: 05/02/2019 07:14   Dg Chest Port 1 View  Result Date: 04/26/2019 CLINICAL DATA:  Encounter for central line placement EXAM: PORTABLE CHEST 1 VIEW COMPARISON:  03/27/2019 FINDINGS: Patient has a RIGHT IJ central line, tip to the superior vena cava. A LEFT IJ dialysis catheter tip overlies the level of the superior vena  cava. Patient has a LEFT-sided transvenous pacemaker with leads to the RIGHT atrium and RIGHT ventricle. LEFT ventricular assist device appears unchanged. No pneumothorax. Heart is mildly enlarged and stable in configuration. Stable elevation of the RIGHT hemidiaphragm. Lungs are clear. No edema. IMPRESSION: RIGHT IJ central line tip to the superior vena cava. No pneumothorax. Electronically Signed   By: Nolon Nations M.D.   On: 04/26/2019 20:23   Disposition   Pt is being discharged home today in good condition.  Follow-up Plans & Appointments    Follow-up Information    [facility] Follow up on 05/21/2019.   Specialty: Cardiology Why: Keep appointment with the VAD clinic at 11 AM Contact information: [address] [id number] [place]       Larey Dresser, [name] Follow up.   Specialty: Cardiology Why: As scheduled Contact information: [place] [address] [phone]          Discharge Instructions    ([place]) Call [name]:  Anytime you have any of the following symptoms: 1) 3 pound weight gain in 24 hours or 5 pounds in 1 week 2) shortness of breath, with or without a dry hacking cough 3) swelling in the hands, feet or stomach 4) if you have to sleep on extra pillows at night in order to breathe.   Complete by: As directed    Diet - low sodium heart healthy   Complete by: As directed    Diet Carb Modified   Complete by: As directed    Increase activity slowly   Complete by: As directed       Discharge Medications   Allergies as of 05/07/2019      Reactions   Metformin And Related Diarrhea      Medication List    TAKE these medications   acetaminophen 325 MG tablet Commonly known as: TYLENOL Take 2 tablets (650 mg total) by mouth every 4 (four) hours as needed for headache or mild pain.   aspirin EC 81 MG tablet Take 1 tablet (81 mg total)  by mouth daily.   atorvastatin 40 MG tablet Commonly known as: LIPITOR Take 1 tablet (40 mg total) by mouth daily at 6 PM.   calcitRIOL 0.5 MCG capsule Commonly known as: ROCALTROL Take 1 capsule (0.5 mcg total) by mouth  every Monday, Wednesday, and Friday.   citalopram 20 MG tablet Commonly known as: CELEXA Take 1 tablet (20 mg total) by mouth daily.   diclofenac sodium 1 % Gel Commonly known as: VOLTAREN Apply 4 g topically 4 (four) times daily. What changed:   when to take this  reasons to take this   docusate sodium 100 MG capsule Commonly known as: COLACE Take 1 capsule (100 mg total) by mouth daily as needed for mild constipation.   dronabinol 2.5 MG capsule Commonly known as: MARINOL Take 1 capsule (2.5 mg total) by mouth 2 (two) times daily before lunch and supper.   erythromycin 250 MG tablet Commonly known as: E-MYCIN Take 1 tablet (250 mg total) by mouth 3 (three) times daily.   gabapentin 300 MG capsule Commonly known as: NEURONTIN Take 1 capsule (300 mg total) by mouth 2 (two) times daily.   hydrALAZINE 25 MG tablet Commonly known as: APRESOLINE Take 1 tablet (25 mg total) by mouth 3 (three) times daily. Give ONLY on Sunday, Tuesday, Thursday, Saturday (non-HD days)   insulin glargine 100 UNIT/ML injection Commonly known as: LANTUS Inject 0.1 mLs (10 Units total) into the skin daily. What changed: when to take this   LORazepam 0.5 MG tablet Commonly known as: ATIVAN Take 1 tablet (0.5 mg total) by mouth every 12 (twelve) hours as needed (nausea).   metoCLOPramide 10 MG tablet Commonly known as: REGLAN Take 1 tablet (10 mg total) by mouth 3 (three) times daily. What changed: when to take this   midodrine 5 MG tablet Commonly known as: PROAMATINE Take 1 tablet (5 mg total) by mouth daily. Take 1 tablet before dialysis treatment What changed: when to take this   multivitamin Tabs tablet Take 1 tablet by mouth daily.   NovoLOG 100 UNIT/ML  injection Generic drug: insulin aspart INJECT 7 UNITS INTO THE SKIN DAILY WITH SUPPER. What changed: See the new instructions.   oxyCODONE 5 MG immediate release tablet Commonly known as: Roxicodone Take 1-2 tablets every 4-6 hours for severe pain. What changed:   how much to take  how to take this  when to take this  reasons to take this  additional instructions   pantoprazole 40 MG tablet Commonly known as: Protonix Take 1 tablet (40 mg total) by mouth 2 (two) times daily before lunch and supper.   promethazine 12.5 MG tablet Commonly known as: PHENERGAN Take 1 tablet (12.5 mg total) by mouth every 6 (six) hours as needed for nausea or vomiting.   traMADol 50 MG tablet Commonly known as: ULTRAM Take 1 tablet (50 mg total) by mouth every 6 (six) hours as needed for moderate pain.   warfarin 5 MG tablet Commonly known as: COUMADIN Take as directed. If you are unsure how to take this medication, talk to your nurse or doctor. Original instructions: TAKE AS FOLLOWS: MON-WED-FRI 1&1/2 TABLETS DAILY. ON ALL OTHER DAYS 1 TABLET What changed:   how much to take  how to take this  when to take this  additional instructions          Outstanding Labs/Studies   None  Duration of Discharge Encounter   Greater than 30 minutes including physician time.  Alcario Drought Delorean Knutzen NP 05/07/2019, 4:46 PM

## 2019-05-07 NOTE — Progress Notes (Signed)
OT Cancellation Note  Patient Details Name: [name] MRN: [id number] DOB: 1966/02/07   Cancelled Treatment:    Reason Eval/Treat Not Completed: Fatigue/lethargy limiting ability to participate.  Lucille Passy, OTR/L [place] Pager [phone] Office [phone]   Lucille Passy M 05/07/2019, 12:28 PM

## 2019-05-08 LAB — CBC
HCT: 30.1 % — ABNORMAL LOW (ref 39.0–52.0)
Hemoglobin: 9.1 g/dL — ABNORMAL LOW (ref 13.0–17.0)
MCH: 27.9 pg (ref 26.0–34.0)
MCHC: 30.2 g/dL (ref 30.0–36.0)
MCV: 92.3 fL (ref 80.0–100.0)
Platelets: 307 10*3/uL (ref 150–400)
RBC: 3.26 MIL/uL — ABNORMAL LOW (ref 4.22–5.81)
RDW: 17.1 % — ABNORMAL HIGH (ref 11.5–15.5)
WBC: 16.3 10*3/uL — ABNORMAL HIGH (ref 4.0–10.5)
nRBC: 0.6 % — ABNORMAL HIGH (ref 0.0–0.2)

## 2019-05-08 LAB — GLUCOSE, CAPILLARY
Glucose-Capillary: 167 mg/dL — ABNORMAL HIGH (ref 70–99)
Glucose-Capillary: 151 mg/dL — ABNORMAL HIGH (ref 70–99)
Glucose-Capillary: 164 mg/dL — ABNORMAL HIGH (ref 70–99)
Glucose-Capillary: 249 mg/dL — ABNORMAL HIGH (ref 70–99)

## 2019-05-08 LAB — BASIC METABOLIC PANEL
Anion gap: 15 (ref 5–15)
BUN: 57 mg/dL — ABNORMAL HIGH (ref 6–20)
CO2: 22 mmol/L (ref 22–32)
Calcium: 9.4 mg/dL (ref 8.9–10.3)
Chloride: 96 mmol/L — ABNORMAL LOW (ref 98–111)
Creatinine, Ser: 9.31 mg/dL — ABNORMAL HIGH (ref 0.61–1.24)
GFR calc Af Amer: 7 mL/min — ABNORMAL LOW (ref >60)
GFR calc non Af Amer: 6 mL/min — ABNORMAL LOW (ref >60)
Glucose, Bld: 108 mg/dL — ABNORMAL HIGH (ref 70–99)
Potassium: 4.3 mmol/L (ref 3.5–5.1)
Sodium: 133 mmol/L — ABNORMAL LOW (ref 135–145)

## 2019-05-08 LAB — LACTATE DEHYDROGENASE: LDH: 151 U/L (ref 98–192)

## 2019-05-08 LAB — PROTIME-INR
INR: 2.3 — ABNORMAL HIGH (ref 0.8–1.2)
Prothrombin Time: 24.6 seconds — ABNORMAL HIGH (ref 11.4–15.2)

## 2019-05-08 MED ORDER — HEPARIN SODIUM (PORCINE) 1000 UNIT/ML IJ SOLN
INTRAMUSCULAR | Status: AC
  Administered 2019-05-08: 1000 [IU]
  Filled 2019-05-08: qty 4

## 2019-05-08 NOTE — Progress Notes (Signed)
Physical Therapy Treatment Patient Details Name: [name] MRN: [id number] DOB: 1966-08-14 Today's Date: 05/08/2019    History of Present Illness 53 yo male admitted for ischemic foot pain with need for amputation => transmetatarsal amputation right foot on 8/25. PMH - LVAD 10/18 with emergent pump exchange 6/19, ESRD on HD, CHF, CAD, DM, cardiomyopathy    PT Comments    Pt admitted with above diagnosis. Pt was able to progress ambulation further with min assist and RW.  OT followed pt with chair for safety and he still needs to sit when fatigues.  Discussed d/c plan at length as pt states his son can help but he does really want to be stronger and progress his ambulation and feels that if a SNF can take him and give him therapy he would improved faster and better.  If a SNF is available, pt prefers this option, but if not will need 24 hour care, HHPT and HHOT services.  Also needs wheelchair as below.  PT and OT will try to find pt a RW with 2 wheels as his insurance recently purchased a rollator.   Pt currently with functional limitations due to the deficits listed below (see PT Problem List). Pt will benefit from skilled PT to increase their independence and safety with mobility to allow discharge to the venue listed below.     Follow Up Recommendations  SNF;Supervision/Assistance - 24 hour     Equipment Recommendations  Wheelchair (18x16 lightweight with desk armrests, elevating legrests and anti tippers);Wheelchair cushion (18x16 pressure relieving cushion)    Recommendations for Other Services       Precautions / Restrictions Precautions Precautions: Fall;Other (comment)(LVAD) Required Braces or Orthoses: Other Brace Other Brace: Darco shoe--R Restrictions Weight Bearing Restrictions: Yes RLE Weight Bearing: (heel bearing only) Other Position/Activity Restrictions:  pt to weight bear on rt heel     Mobility  Bed Mobility Overal bed mobility: Needs Assistance Bed  Mobility: Supine to Sit     Supine to sit: Min assist     General bed mobility comments: pulled up on therapist's hand, increased time  Transfers Overall transfer level: Needs assistance Equipment used: Rolling walker (2 wheeled) Transfers: Sit to/from Stand Sit to Stand: Min assist;+2 safety/equipment;From elevated surface         General transfer comment: verbal cues for hand placement, assist to rise and steady  Ambulation/Gait Ambulation/Gait assistance: Min assist;+2 safety/equipment Gait Distance (Feet): 20 Feet Assistive device: Rolling walker (2 wheeled) Gait Pattern/deviations: Step-to pattern;Decreased stance time - right;Decreased weight shift to right;Trunk flexed Gait velocity: decr Gait velocity interpretation: <1.31 ft/sec, indicative of household ambulator General Gait Details: Assist for safety and lines. Pt weight bearing through rt heel today and progressed ambulation even further. Still fatigues.   Stairs             Wheelchair Mobility    Modified Rankin (Stroke Patients Only)       Balance Overall balance assessment: Needs assistance Sitting-balance support: No upper extremity supported;Feet supported Sitting balance-[name]: Fair     Standing balance support: Bilateral upper extremity supported;During functional activity Standing balance-[name]: Poor Standing balance comment: Reliant on physical A and UE support.                            Cognition Arousal/Alertness: Awake/alert Behavior During Therapy: WFL for tasks assessed/performed Overall Cognitive Status: Within Functional Limits for tasks assessed  General Comments: slow processing      Exercises General Exercises - Lower Extremity Ankle Circles/Pumps: AROM;Both;10 reps;Seated Long Arc Quad: AROM;Both;10 reps;Seated    General Comments General comments (skin integrity, edema, etc.): Switched pt to  battery for the walk.        Pertinent Vitals/Pain Pain Assessment: Faces Faces Pain Scale: Hurts even more Pain Location: Right foot Pain Descriptors / Indicators: Grimacing;Guarding;Aching Pain Intervention(s): Limited activity within patient's tolerance;Monitored during session;Repositioned    Home Living                      Prior Function            PT Goals (current goals can now be found in the care plan section) Acute Rehab PT Goals Patient Stated Goal: "I want to be able to walk independently again" Progress towards PT goals: Progressing toward goals    Frequency    Min 3X/week      PT Plan Discharge plan needs to be updated    Co-evaluation PT/OT/SLP Co-Evaluation/Treatment: Yes Reason for Co-Treatment: Complexity of the patient's impairments (multi-system involvement);For patient/therapist safety PT goals addressed during session: Mobility/safety with mobility OT goals addressed during session: ADL's and self-care;Proper use of Adaptive equipment and DME      AM-PAC PT "6 Clicks" Mobility   Outcome Measure  Help needed turning from your back to your side while in a flat bed without using bedrails?: None Help needed moving from lying on your back to sitting on the side of a flat bed without using bedrails?: None Help needed moving to and from a bed to a chair (including a wheelchair)?: A Little Help needed standing up from a chair using your arms (e.g., wheelchair or bedside chair)?: A Little Help needed to walk in hospital room?: A Little Help needed climbing 3-5 steps with a railing? : A Lot 6 Click Score: 19    End of Session Equipment Utilized During Treatment: Gait belt Activity Tolerance: Patient limited by fatigue;Patient limited by pain Patient left: in chair;with call bell/phone within reach Nurse Communication: Mobility status PT Visit Diagnosis: Other abnormalities of gait and mobility (R26.89);Pain;Unsteadiness on feet  (R26.81) Pain - Right/Left: Right Pain - part of body: Ankle and joints of foot     Time: 1133-1200 PT Time Calculation (min) (ACUTE ONLY): 27 min  Charges:  $Gait Training: 8-22 mins                     [place] Pager:  [phone]  Office:  [place] 05/08/2019, 3:30 PM

## 2019-05-08 NOTE — Progress Notes (Signed)
ANTICOAGULATION CONSULT NOTE - Follow Up Consult  Pharmacy Consult for Heparin>warfarin Indication: LVAD  Allergies  Allergen Reactions  . Metformin And Related Diarrhea    Patient Measurements: Height: 5' 7.01" (170.2 cm) Weight: 197 lb 15.6 oz (89.8 kg) IBW/kg (Calculated) : 66.12 Heparin Dosing Weight: 83.2 kg  Vital Signs: Temp: 98.2 F (36.8 C) (09/02 0345) Temp Source: Oral (09/02 0345) BP: 110/91 (09/02 0345) Pulse Rate: 78 (09/02 0350)  Labs: Recent Labs    05/06/19 0415 05/07/19 0423 05/07/19 1005 05/08/19 0402  HGB 8.3*  --  9.2* 9.1*  HCT 26.5*  --  29.5* 30.1*  PLT 272  --  280 307  LABPROT 23.8* 22.2*  --  24.6*  INR 2.2* 2.0*  --  2.3*  HEPARINUNFRC 0.42  --   --   --   CREATININE 10.09* 7.70*  --  9.31*    Estimated Creatinine Clearance: 9.7 mL/min (A) (by C-G formula based on SCr of 9.31 mg/dL (H)).   Assessment: 20 yom w/ hx Heartmate 3 LVAD, factor V Leiden, and PAF on warfarin PTA admitted with ischemic foot now s/p transmetatarsal amputation on R foot 8/25 and stent 8/27. Pt has been resumed on home warfarin regimen, heparin bridge off 8/31. INR today is therapeutic at 2.3, LDH stable, CBC at baseline.  **Home regimen 5 mg daily except 7.5 mg on Tuesday/Thurs  Goal of Therapy:  INR: 2-3 Monitor platelets by anticoagulation protocol: Yes   Plan:  -Warfarin home regimen resumed - 64m daily except 7.572mTues/thurs -Daily protime   MiArrie SenatePharmD, BCPS Clinical Pharmacist [phone] check AMION for all [facility] 05/08/2019

## 2019-05-08 NOTE — Plan of Care (Signed)
  Problem: Cardiac: Goal: LVAD will function as expected and patient will experience no clinical alarms Outcome: Completed/Met   Problem: Health Behavior/Discharge Planning: Goal: Ability to manage health-related needs will improve Outcome: Progressing   Problem: Clinical Measurements: Goal: Ability to maintain clinical measurements within normal limits will improve Outcome: Progressing Goal: Will remain free from infection Outcome: Progressing   Problem: Activity: Goal: Risk for activity intolerance will decrease Outcome: Progressing

## 2019-05-08 NOTE — Progress Notes (Signed)
Admit: 04/26/2019 LOS: 12  38M ESRD w/ LVAD and ischemic R foot s/p TMA and LLE angiogram + stent  Subjective:   . No issues, no SOB  09/01 0701 - 09/02 0700 In: 260 [P.O.:240; I.V.:20] Out: -   Filed Weights   05/06/19 0750 05/07/19 0500 05/08/19 0500  Weight: 89.9 kg 89.5 kg 89.8 kg    Scheduled Meds: . aspirin EC  81 mg Oral Daily  . atorvastatin  40 mg Oral q1800  . calcitRIOL  0.5 mcg Oral Q M,W,F  . Chlorhexidine Gluconate Cloth  6 each Topical Q0600  . Chlorhexidine Gluconate Cloth  6 each Topical Q0600  . citalopram  20 mg Oral Daily  . darbepoetin (ARANESP) injection - DIALYSIS  150 mcg Intravenous Q Mon-HD  . erythromycin  250 mg Oral TID  . gabapentin  300 mg Oral BID  . hydrALAZINE  25 mg Oral 3 times per day on Sun Tue Thu Sat  . insulin aspart  0-9 Units Subcutaneous TID WC  . insulin aspart  7 Units Subcutaneous Q supper  . insulin glargine  10 Units Subcutaneous QHS  . LORazepam  0.5 mg Oral BID  . mouth rinse  15 mL Mouth Rinse BID  . metoCLOPramide  10 mg Oral TID WC  . midodrine  5 mg Oral Q M,W,F  . multivitamin  1 tablet Oral QHS  . sodium chloride flush  3 mL Intravenous Q12H  . traMADol  50 mg Oral Q6H  . warfarin  5 mg Oral Once per day on Sun Mon Wed Fri Sat  . warfarin  7.5 mg Oral Once per day on Tue Thu  . Warfarin - Pharmacist Dosing Inpatient   Does not apply q1800   Continuous Infusions: . sodium chloride 10 mL/hr at 05/02/19 0636  . sodium chloride     PRN Meds:.sodium chloride, acetaminophen, docusate sodium, hydrALAZINE, labetalol, ondansetron (ZOFRAN) IV, oxyCODONE, promethazine, promethazine, sodium chloride flush  Current Labs: reviewed   Physical Exam:  Blood pressure (!) 115/101, pulse 81, temperature 98.4 F (36.9 C), temperature source Oral, resp. rate 15, height 5' 7.01" (1.702 m), weight 89.8 kg, SpO2 94 %. NAD COnt hum from LVAD RRR NO LEE R TMA bandaged AAO x3 TDC L IJ  Dialysis:  MWF [facility]  4h 30mn  400/800    84kg  2K/3Ca  TDC  Hep none - Hectoral 247m IV q HD - Mircera 20082mq 2 weeks (last 8/18) - Venofer 74m52m weekly  Assessment/ Plan 1.  R foot ischemia/dry gangrene + ischemic L foot: s/p R TMA 8/25 and angio with stent of LLE 8/27.   2. ESRD: HD MWF. Midodrine pre HD and midHD keep MAP > 60-65.  3. Hypertension/volume: BP controlled, remains vol up by 5- 6 kg and looks swollen on exam 4.  Anemia:  Hgb down, resume Aranesp 150 with HD 8/31 5.  Metabolic bone disease: Ca OK, on C3 6.  CAD/ICM/LVAD: Per HF team 7.  Type 2 DM with known severe gastroparesis 8.  Dispo - needs rehab, looking for SNF  Rob Kelly Splinter 05/08/2019, 11:48 AM    Recent Labs  Lab 05/03/19 0707  05/06/19 0415 05/07/19 0423 05/08/19 0402  NA 134*   < > 130* 132* 133*  K 4.3   < > 4.1 4.0 4.3  CL 97*   < > 92* 96* 96*  CO2 25   < > 22 23 22   GLUCOSE 164*   < > 112* 131*  108*  BUN 47*   < > 60* 43* 57*  CREATININE 9.00*   < > 10.09* 7.70* 9.31*  CALCIUM 8.9   < > 8.7* 9.2 9.4  PHOS 5.3*  --   --   --   --    < > = values in this interval not displayed.   Recent Labs  Lab 05/06/19 0415 05/07/19 1005 05/08/19 0402  WBC 19.8* 18.9* 16.3*  HGB 8.3* 9.2* 9.1*  HCT 26.5* 29.5* 30.1*  MCV 91.7 91.9 92.3  PLT 272 280 307

## 2019-05-08 NOTE — TOC Progression Note (Addendum)
Transition of Care [facility]) - Progression Note    Patient Details  Name: [name] MRN: [id number] Date of Birth: 26-Mar-1966  Transition of Care [facility]) CM/SW Contact  Zenon Mayo, RN Phone Number: 05/08/2019, 7:35 AM  Clinical Narrative:    From home with daughter, LVAD patient, dialysis MWF,  states she has no issues in getting his meds.  s/p right metatarsal amputation after revascularization and failed toe amputation. Plan will be for left lower extremity revascularization. He has no CIR benefits ,but has SNF benefits, plan for SNF.  Per LVAD rep patient has will need to go to [facility] SNF.   Expected Discharge Plan: [place] Barriers to Discharge: No Barriers Identified  Expected Discharge Plan and Services Expected Discharge Plan: [place] In-house Referral: NA Discharge Planning Services: CM Consult Post Acute Care Choice: NA Living arrangements for the past 2 months: Single Family Home Expected Discharge Date: 05/07/19               DME Arranged: (NA)         HH Arranged: NA           Social Determinants of Health (SDOH) Interventions    Readmission Risk Interventions Readmission Risk Prevention Plan 03/20/2019  Transportation Screening Complete  Medication Review Press photographer) Complete  HRI or [place] Complete  SW Recovery Care/Counseling Consult Complete  [place] Not Applicable  Some recent data might be hidden

## 2019-05-08 NOTE — Progress Notes (Signed)
Patient ID: [name], male   DOB: 1966-02-08, 53 y.o.   MRN: [id number]   Advanced Heart Failure VAD Team Note  PCP-Cardiologist: No primary care provider on file.   Subjective:    Admitted for ischemic foot pain with need for amputation => transmetatarsal amputation right foot on 8/25.   Left leg angiogram 8/27: DES to left SFA.  1 vessel runoff via peroneal, severe disease with multilevel stenosis but unable to intervene.   Still with right foot pain but improving.  Did not get out of bed yesterday.   MAP 90s-100s  LVAD INTERROGATION:  HeartMate 3 LVAD:   Flow 4.1 L  liters/min, speed 5500, power 4.4, PI 3.9. 2 PI events.      Objective:    Vital Signs:   Temp:  [98 F (36.7 C)-98.3 F (36.8 C)] 98 F (36.7 C) (09/02 0738) Pulse Rate:  [72-83] 81 (09/02 0738) Resp:  [11-17] 16 (09/02 0738) BP: (100-122)/(80-99) 116/98 (09/02 0738) SpO2:  [96 %-100 %] 99 % (09/02 0738) Weight:  [89.8 kg] 89.8 kg (09/02 0500) Last BM Date: 04/25/19 Mean arterial Pressure 90s-100s  Intake/Output:   Intake/Output Summary (Last 24 hours) at 05/08/2019 0816 Last data filed at 05/08/2019 0400 Gross per 24 hour  Intake 260 ml  Output -  Net 260 ml     Physical Exam    General: Well appearing this am. NAD.  HEENT: Normal. Neck: Supple, JVP 7-8 cm. Carotids OK.  Cardiac:  Mechanical heart sounds with LVAD hum present.  Lungs:  CTAB, normal effort.  Abdomen:  NT, ND, no HSM. No bruits or masses. +BS  LVAD exit site: Well-healed and incorporated. Dressing dry and intact. No erythema or drainage. Stabilization device present and accurately applied. Driveline dressing changed daily per sterile technique. Extremities:  Warm and dry. No cyanosis, clubbing, rash, or edema. S/p right TMA Neuro:  Alert & oriented x 3. Cranial nerves grossly intact. Moves all 4 extremities w/o difficulty. Affect pleasant     Telemetry   Sinus 90s Personally reviewed   Labs   Basic Metabolic Panel:  Recent Labs  Lab 05/03/19 0707 05/05/19 0352 05/06/19 0415 05/07/19 0423 05/08/19 0402  NA 134* 130* 130* 132* 133*  K 4.3 3.7 4.1 4.0 4.3  CL 97* 92* 92* 96* 96*  CO2 25 25 22 23 22   GLUCOSE 164* 135* 112* 131* 108*  BUN 47* 44* 60* 43* 57*  CREATININE 9.00* 7.95* 10.09* 7.70* 9.31*  CALCIUM 8.9 8.9 8.7* 9.2 9.4  PHOS 5.3*  --   --   --   --     Liver Function Tests: Recent Labs  Lab 05/03/19 0707  ALBUMIN 2.3*   No results for input(s): LIPASE, AMYLASE in the last 168 hours. No results for input(s): AMMONIA in the last 168 hours.  CBC: Recent Labs  Lab 05/04/19 0326 05/05/19 0352 05/06/19 0415 05/07/19 1005 05/08/19 0402  WBC 20.1* 19.6* 19.8* 18.9* 16.3*  HGB 8.9* 8.7* 8.3* 9.2* 9.1*  HCT 28.9* 28.2* 26.5* 29.5* 30.1*  MCV 93.8 93.1 91.7 91.9 92.3  PLT 273 280 272 280 307    INR: Recent Labs  Lab 05/04/19 0326 05/05/19 0352 05/06/19 0415 05/07/19 0423 05/08/19 0402  INR 1.4* 1.7* 2.2* 2.0* 2.3*    Other results:    Imaging   No results found.   Medications:     Scheduled Medications: . aspirin EC  81 mg Oral Daily  . atorvastatin  40 mg Oral q1800  .  calcitRIOL  0.5 mcg Oral Q M,W,F  . Chlorhexidine Gluconate Cloth  6 each Topical Q0600  . Chlorhexidine Gluconate Cloth  6 each Topical Q0600  . citalopram  20 mg Oral Daily  . darbepoetin (ARANESP) injection - DIALYSIS  150 mcg Intravenous Q Mon-HD  . erythromycin  250 mg Oral TID  . gabapentin  300 mg Oral BID  . hydrALAZINE  25 mg Oral 3 times per day on Sun Tue Thu Sat  . insulin aspart  0-9 Units Subcutaneous TID WC  . insulin aspart  7 Units Subcutaneous Q supper  . insulin glargine  10 Units Subcutaneous QHS  . LORazepam  0.5 mg Oral BID  . mouth rinse  15 mL Mouth Rinse BID  . metoCLOPramide  10 mg Oral TID WC  . midodrine  5 mg Oral Q M,W,F  . multivitamin  1 tablet Oral QHS  . sodium chloride flush  3 mL Intravenous Q12H  . traMADol  50 mg Oral Q6H  . warfarin  5 mg Oral  Once per day on Sun Mon Wed Fri Sat  . warfarin  7.5 mg Oral Once per day on Tue Thu  . Warfarin - Pharmacist Dosing Inpatient   Does not apply q1800    Infusions: . sodium chloride 10 mL/hr at 05/02/19 0636  . sodium chloride      PRN Medications: sodium chloride, acetaminophen, docusate sodium, hydrALAZINE, labetalol, ondansetron (ZOFRAN) IV, oxyCODONE, promethazine, promethazine, sodium chloride flush  Assessment/Plan:    1. PAD/ischemic feet: S/p R transmetatarsal amputation on 8/25.  WBCs up to 27 post-op but afebrile, may be reactive.  WBCs now trending down slowly, 16 today.  He completed vancomycin for ?osteomyelitis on 8/19 (was getting with HD). Cultures NGTD. Left great toe dusky, now s/p left leg angiogram with SFA PCI.  There was 1 vessel runoff via left peroneal with severe multilevel stenosis => unable to intervene on peroneal.  - Currently off abx.  2. Chronic systolic CHF: Ischemic cardiomyopathy. St Jude ICD. NSTEMI in March 2018 with DES to LAD and RCA, complicated by cardiogenic shock, low output requiring milrinone. Echo 8/18 with EF 20-25%, moderate MR. CPX (8/18) with severe functional impairment due to HF. He is s/p HM3 LVAD placement. He had pump thrombosis 6/19 due to [facility] outflow graft kinking. He had pump exchange for another HM3. WEX9.3 today.  - Volume managed at dialysis, MWF.   - Continue ASA 81and warfarin with INR goal 2-2.5.   - He [facility] for evaluation for heart/kidney transplantand recently saw Dr Mosetta Pigeon =>he is not candidate for transplantnowgiven extensive PAD.  - Consistently elevated MAP on non-HD days.  Continue hydralazine 25 mg tid on non-HD days. 3. ESRD: HD on M-W-F with midodrine pre-HD.  4.  CAD: NSTEMI in 3/18. LHC with 99% ulcerated lesion proximal RCA with left to right collaterals, 95% mid LAD stenosis after mid LAD stent. s/p PCI to RCA and LAD on 11/25/16. No chest pain.  - Continue statin and ASA. 5. h/o  DVTs: Factor V Leiden heterozygote. -Anticoagulated. 6. Diabetic gastroparesis: He has seen gastroparesis specialist at [facility]. Surprisingly, gastric emptying study was normal. However, electrogastrogram showed poor gastric accomodation/capacity. Therefore, small frequent meals recommended.Nausea earlier this admission, now resolved.  - Continue reglan 10 mg tid/ac and Marinol.  - Continue Protonix bid.  - He will continue erythromycinindefinitely.  - Limit narcotic use as much as possible.  6. DM:SSI and home insulin regimen.  7. BZJ:IRCVELFY  hydralazine on non-HD days.  8. Deconditioning: Continue PT/OT, needs these daily.  I strongly encouraged him to work on getting up. PT recommend CIR, but his insurance will not cover this.  He is not able to go home without further PT/rehab.  Will work with social work to find a SNF that will take him for rehab.    I reviewed the LVAD parameters from today, and compared the results to the patient's prior recorded data.  No programming changes were made.  The LVAD is functioning within specified parameters.  The patient performs LVAD self-test daily.  LVAD interrogation was negative for any significant power changes, alarms or PI events/speed drops.  LVAD equipment check completed and is in good working order.  Back-up equipment present.   LVAD education done on emergency procedures and precautions and reviewed exit site care.  Length of Stay: 69  Loralie Champagne, MD 05/08/2019, 8:16 AM  VAD Team --- VAD ISSUES ONLY--- Pager [phone] (7am - 7am)  Advanced Heart Failure Team  Pager [phone] (M-F; 7a - 4p)  Please contact [facility] for night-coverage after hours (4p -7a ) and weekends on [email or web address]

## 2019-05-08 NOTE — Progress Notes (Signed)
Occupational Therapy Treatment Patient Details Name: [name] MRN: [id number] DOB: 04-09-1966 Today's Date: 05/08/2019    History of present illness 53 yo male admitted for ischemic foot pain with need for amputation => transmetatarsal amputation right foot on 8/25. PMH - LVAD 10/18 with emergent pump exchange 6/19, ESRD on HD, CHF, CAD, DM, cardiomyopathy   OT comments  Pt continues to require max to total assist for LB dressing and mod assist for UB, including battery vest. All mobility is performed slowly with great effort, but with increased distance and use of RW to minimize weight on R LE for comfort. Pt set up to wash hands and eat lunch meal in chair at end of session. Updated d/c disposition to SNF as pt's insurance does not cover inpatient rehab. Pt is hopeful to progress to discharge home with the assistance of his son.  Follow Up Recommendations  SNF;Supervision/Assistance - 24 hour    Equipment Recommendations  (RW)    Recommendations for Other Services      Precautions / Restrictions Precautions Precautions: Fall;Other (comment)(LVAD) Required Braces or Orthoses: Other Brace Other Brace: Darco shoe--R Restrictions Weight Bearing Restrictions: Yes Other Position/Activity Restrictions:  pt to weight bear on rt heel        Mobility Bed Mobility Overal bed mobility: Needs Assistance Bed Mobility: Supine to Sit     Supine to sit: Min assist     General bed mobility comments: pulled up on therapist's hand, increased time  Transfers   Equipment used: Rolling walker (2 wheeled) Transfers: Sit to/from Stand Sit to Stand: [employer] safety/equipment;From elevated surface         General transfer comment: verbal cues for hand placement, assist to rise and steady    Balance Overall balance assessment: Needs assistance   Sitting balance-[name]: Fair       Standing balance-[name]: Poor Standing balance comment: Reliant on physical A and  UE support.                           ADL either performed or assessed with clinical judgement   ADL Overall ADL's : Needs assistance/impaired Eating/Feeding: Sitting;Set up   Grooming: Wash/dry hands;Sitting;Set up           Upper Body Dressing : Moderate assistance;Sitting Upper Body Dressing Details (indicate cue type and reason): LVAD vest and front opening gown Lower Body Dressing: Maximal assistance;Sit to/from stand   Toilet Transfer: Minimal assistance;Ambulation;RW   Toileting- Clothing Manipulation and Hygiene: Maximal assistance;Sit to/from stand       Functional mobility during ADLs: Minimal assistance;+2 for safety/equipment;Rolling walker;Cueing for sequencing General ADL Comments: pt moves extremely slowly, assisted for 50% of changing LVAD power source to batteries     Vision       Perception     Praxis      Cognition Arousal/Alertness: Awake/alert Behavior During Therapy: WFL for tasks assessed/performed Overall Cognitive Status: Within Functional Limits for tasks assessed                                 General Comments: slow processing        Exercises     Shoulder Instructions       General Comments      Pertinent Vitals/ Pain       Pain Assessment: Faces Faces Pain Scale: Hurts even more Pain Location: Right foot Pain  Descriptors / Indicators: Grimacing;Guarding;Aching Pain Intervention(s): Monitored during session;Repositioned;Patient requesting pain meds-RN notified  Home Living                                          Prior Functioning/Environment              Frequency  Min 2X/week        Progress Toward Goals  OT Goals(current goals can now be found in the care plan section)  Progress towards OT goals: Progressing toward goals  Acute Rehab OT Goals Patient Stated Goal: "I want to be able to walk independently again" OT Goal Formulation: With patient Time For Goal  Achievement: 05/18/19 Potential to Achieve Goals: Good  Plan Discharge plan needs to be updated    Co-evaluation    PT/OT/SLP Co-Evaluation/Treatment: Yes Reason for Co-Treatment: For patient/therapist safety   OT goals addressed during session: ADL's and self-care;Proper use of Adaptive equipment and DME      AM-PAC OT "6 Clicks" Daily Activity     Outcome Measure   Help from another person eating meals?: None Help from another person taking care of personal grooming?: A Little Help from another person toileting, which includes using toliet, bedpan, or urinal?: A Lot Help from another person bathing (including washing, rinsing, drying)?: A Lot Help from another person to put on and taking off regular upper body clothing?: A Lot Help from another person to put on and taking off regular lower body clothing?: Total 6 Click Score: 14    End of Session Equipment Utilized During Treatment: Rolling walker;Gait belt  OT Visit Diagnosis: Unsteadiness on feet (R26.81);Other abnormalities of gait and mobility (R26.89);Muscle weakness (generalized) (M62.81);Pain Pain - Right/Left: Right Pain - part of body: Ankle and joints of foot   Activity Tolerance Patient tolerated treatment well   Patient Left in chair;with call bell/phone within reach;with nursing/sitter in room   Nurse Communication Mobility status        Time: 0175-1025 OT Time Calculation (min): 23 min  Charges: OT General Charges $OT Visit: 1 Visit OT Treatments $Self Care/Home Management : 8-22 mins  [name], OTR/L Acute Rehabilitation Services Pager: [phone] Office: [phone]   [name] 05/08/2019, 2:58 PM

## 2019-05-09 LAB — BASIC METABOLIC PANEL
Anion gap: 14 (ref 5–15)
BUN: 34 mg/dL — ABNORMAL HIGH (ref 6–20)
CO2: 23 mmol/L (ref 22–32)
Calcium: 8.8 mg/dL — ABNORMAL LOW (ref 8.9–10.3)
Chloride: 99 mmol/L (ref 98–111)
Creatinine, Ser: 6.57 mg/dL — ABNORMAL HIGH (ref 0.61–1.24)
GFR calc Af Amer: 10 mL/min — ABNORMAL LOW (ref >60)
GFR calc non Af Amer: 9 mL/min — ABNORMAL LOW (ref >60)
Glucose, Bld: 131 mg/dL — ABNORMAL HIGH (ref 70–99)
Potassium: 4.1 mmol/L (ref 3.5–5.1)
Sodium: 136 mmol/L (ref 135–145)

## 2019-05-09 LAB — CBC
HCT: 30.5 % — ABNORMAL LOW (ref 39.0–52.0)
Hemoglobin: 9.1 g/dL — ABNORMAL LOW (ref 13.0–17.0)
MCH: 27.8 pg (ref 26.0–34.0)
MCHC: 29.8 g/dL — ABNORMAL LOW (ref 30.0–36.0)
MCV: 93.3 fL (ref 80.0–100.0)
Platelets: 303 10*3/uL (ref 150–400)
RBC: 3.27 MIL/uL — ABNORMAL LOW (ref 4.22–5.81)
RDW: 17.2 % — ABNORMAL HIGH (ref 11.5–15.5)
WBC: 16.9 10*3/uL — ABNORMAL HIGH (ref 4.0–10.5)
nRBC: 0.9 % — ABNORMAL HIGH (ref 0.0–0.2)

## 2019-05-09 LAB — GLUCOSE, CAPILLARY
Glucose-Capillary: 87 mg/dL (ref 70–99)
Glucose-Capillary: 316 mg/dL — ABNORMAL HIGH (ref 70–99)
Glucose-Capillary: 102 mg/dL — ABNORMAL HIGH (ref 70–99)
Glucose-Capillary: 189 mg/dL — ABNORMAL HIGH (ref 70–99)

## 2019-05-09 LAB — LACTATE DEHYDROGENASE: LDH: 170 U/L (ref 98–192)

## 2019-05-09 LAB — PROTIME-INR
INR: 2.7 — ABNORMAL HIGH (ref 0.8–1.2)
Prothrombin Time: 28.5 seconds — ABNORMAL HIGH (ref 11.4–15.2)

## 2019-05-09 MED ORDER — WARFARIN SODIUM 5 MG PO TABS
5.0000 mg | ORAL_TABLET | Freq: Once | ORAL | Status: AC
  Administered 2019-05-09: 18:00:00 5 mg via ORAL
  Filled 2019-05-09: qty 1

## 2019-05-09 MED ORDER — CHLORHEXIDINE GLUCONATE CLOTH 2 % EX PADS
6.0000 | MEDICATED_PAD | Freq: Every day | CUTANEOUS | Status: DC
  Administered 2019-05-10 – 2019-05-13 (×3): 6 via TOPICAL

## 2019-05-09 NOTE — Progress Notes (Signed)
Physical Therapy Treatment Patient Details Name: [name] MRN: [id number] DOB: 05/22/1966 Today's Date: 05/09/2019    History of Present Illness 53 yo male admitted for ischemic foot pain with need for amputation => transmetatarsal amputation right foot on 8/25. PMH - LVAD 10/18 with emergent pump exchange 6/19, ESRD on HD, CHF, CAD, DM, cardiomyopathy    PT Comments    Pt admitted with above diagnosis. Pt was able to ambulate and incr distance.  Discussed pt with nurse and with Dr. Aundra Dubin as pt has made good progress and is now walking with min assist and RW.  Pt states son and daughter can help him at home therefore Dr. Aundra Dubin wants him to stay over weekend and then go home Monday if SNF does not have a bed by then.  Nursing can walk with pt as he requires min assist and RW.   Pt currently with functional limitations due to balance and endurance deficits. Pt will benefit from skilled PT to increase their independence and safety with mobility to allow discharge to the venue listed below.     Follow Up Recommendations  SNF;Supervision/Assistance - 24 hour     Equipment Recommendations  Wheelchair (measurements PT);Wheelchair cushion (measurements PT)    Recommendations for Other Services       Precautions / Restrictions Precautions Precautions: Fall;Other (comment)(LVAD) Precaution Comments: Asked MD for post op shoe for rt foot  Required Braces or Orthoses: Other Brace Other Brace: Darco shoe--R Restrictions Weight Bearing Restrictions: Yes RLE Weight Bearing: (bear weight on heel only with boot on ) Other Position/Activity Restrictions:  pt to weight bear on rt heel     Mobility  Bed Mobility Overal bed mobility: Needs Assistance Bed Mobility: Supine to Sit     Supine to sit: Min assist     General bed mobility comments: pulled up on therapist's hand, increased time  Transfers Overall transfer level: Needs assistance Equipment used: Rolling walker (2  wheeled) Transfers: Sit to/from Stand Sit to Stand: Min assist;+2 safety/equipment;From elevated surface         General transfer comment: verbal cues for hand placement, assist to rise and steady  Ambulation/Gait Ambulation/Gait assistance: Min assist;+2 safety/equipment Gait Distance (Feet): 45 Feet Assistive device: Rolling walker (2 wheeled) Gait Pattern/deviations: Step-to pattern;Decreased stance time - right;Decreased weight shift to right;Trunk flexed;Antalgic Gait velocity: decr Gait velocity interpretation: <1.31 ft/sec, indicative of household ambulator General Gait Details: Assist for safety and lines. Pt weight bearing through rt heel today and progressed ambulation even further. Still fatigues.   Stairs             Wheelchair Mobility    Modified Rankin (Stroke Patients Only)       Balance Overall balance assessment: Needs assistance Sitting-balance support: No upper extremity supported;Feet supported Sitting balance-[name]: Fair     Standing balance support: Bilateral upper extremity supported;During functional activity Standing balance-[name]: Poor Standing balance comment: Reliant on physical A and UE support.                            Cognition Arousal/Alertness: Awake/alert Behavior During Therapy: WFL for tasks assessed/performed Overall Cognitive Status: Within Functional Limits for tasks assessed                                        Exercises  General Comments General comments (skin integrity, edema, etc.): Switched pt to battery for the walk.  Switched back to wall power on return to room.       Pertinent Vitals/Pain Pain Assessment: Faces Faces Pain Scale: Hurts little more Pain Location: Right foot Pain Descriptors / Indicators: Grimacing;Guarding;Aching Pain Intervention(s): Limited activity within patient's tolerance;Monitored during session;Repositioned;Premedicated before session     Home Living                      Prior Function            PT Goals (current goals can now be found in the care plan section) Acute Rehab PT Goals Patient Stated Goal: "I want to be able to walk independently again" Progress towards PT goals: Progressing toward goals    Frequency    Min 3X/week      PT Plan Current plan remains appropriate    Co-evaluation              AM-PAC PT "6 Clicks" Mobility   Outcome Measure  Help needed turning from your back to your side while in a flat bed without using bedrails?: None Help needed moving from lying on your back to sitting on the side of a flat bed without using bedrails?: None Help needed moving to and from a bed to a chair (including a wheelchair)?: A Little Help needed standing up from a chair using your arms (e.g., wheelchair or bedside chair)?: A Little Help needed to walk in hospital room?: A Little Help needed climbing 3-5 steps with a railing? : A Lot 6 Click Score: 19    End of Session Equipment Utilized During Treatment: Gait belt Activity Tolerance: Patient limited by fatigue;Patient limited by pain Patient left: in chair;with call bell/phone within reach Nurse Communication: Mobility status PT Visit Diagnosis: Other abnormalities of gait and mobility (R26.89);Pain;Unsteadiness on feet (R26.81) Pain - Right/Left: Right Pain - part of body: Ankle and joints of foot     Time: 1610-9604 PT Time Calculation (min) (ACUTE ONLY): 36 min  Charges:  $Gait Training: 23-37 mins                     [place] Pager:  [phone]  Office:  [place] 05/09/2019, 1:30 PM

## 2019-05-09 NOTE — NC FL2 (Addendum)
[place] LEVEL OF CARE SCREENING TOOL     IDENTIFICATION  Patient Name: [name] Birthdate: [birth year] Sex: male Admission Date (Current Location): 04/26/2019  [facility] and [place] Number:  Herbalist and Address:  [facility] [place] [place] [address], [place], [place] [address]      Provider Number: [id number]  Attending Physician Name and Address:  Larey Dresser, MD  Relative Name and Phone Number:  Margreta Journey ([id number])[phone]    Current Level of Care: Hospital Recommended Level of Care: [place] Prior Approval Number:    Date Approved/Denied:   PASRR Number: [id number]  Discharge Plan: SNF    Current Diagnoses: Patient Active Problem List   Diagnosis Date Noted  . Hospital discharge follow-up 04/15/2019  . Back pain 04/15/2019  . Warfarin anticoagulation 04/15/2019  . Ischemic pain of right foot 03/15/2019  . Ischemic foot 03/15/2019  . Subtherapeutic international normalized ratio (INR) 08/28/2018  . Acute vomiting   . Heart failure ([place]) 05/23/2018  . Hypertensive urgency 03/25/2018  . Benign essential HTN   . Crohn's disease with complication ([place])   . Diabetes mellitus type 2 in nonobese (HCC)   . Anemia of chronic disease   . Sinus tachycardia   . Left ventricular assist device (LVAD) complication 97/58/8325  . Left ventricular assist device (LVAD) complication, initial encounter 02/28/2018  . Factor V Leiden carrier ([place]) 01/10/2018  . History of creation of ostomy ([place]) 01/10/2018  . ESRD (end stage renal disease) ([place]) 12/11/2017  . Left ventricular assist device present ([place]) 12/05/2017  . ESRD (end stage renal disease) on dialysis ([place]) 10/29/2017  . Intractable nausea and vomiting 07/31/2017  . 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) ([place]) 07/10/2017  . LVAD (left ventricular assist device) present ([place])   . Palliative care by specialist   . Chronic systolic CHF  (congestive heart failure) ([place]) 06/07/2017  . ACP (advance care planning)   . Encounter for central line placement   . Palliative care encounter   . CHF exacerbation ([place]) 04/05/2017  . Diabetic keto-acidosis ([place]) 04/05/2017  . ESRD on dialysis ([place]) 01/17/2017  . AKI (acute kidney injury) ([place])   . Non-ST elevation (NSTEMI) myocardial infarction ([place])   . CHF (congestive heart failure) ([place]) 09/02/2016  . Elevated serum creatinine   . Encounter for therapeutic drug monitoring 08/03/2016  . Chest pain 04/26/2012  . SVT (supraventricular tachycardia) ([place]) 04/26/2012  . Gastroparesis 11/18/2011  . Coronary artery disease   . Ischemic cardiomyopathy   . Essential hypertension   . Automatic implantable cardioverter-defibrillator in situ   . Deep venous thrombosis ([place])   . POLYP, COLON 09/25/2008  . Dyslipidemia, goal LDL below 70 09/25/2008  . GASTRITIS 09/25/2008  . DIVERTICULOSIS, COLON 09/25/2008  . History of Crohn's disease 09/05/2006    Orientation RESPIRATION BLADDER Height & Weight     Self, Time, Situation, Place    Continent Weight: 192 lb 14.4 oz (87.5 kg) Height:  5' 7.01" (170.2 cm)  BEHAVIORAL SYMPTOMS/MOOD NEUROLOGICAL BOWEL NUTRITION STATUS      Continent Diet(see discharge summary)  AMBULATORY STATUS COMMUNICATION OF NEEDS Skin     Verbally Other (Comment), Surgical wounds(right toe amputation, right abdomen catheter entry/exit location)                       Personal Care Assistance Level of Assistance              Functional  Limitations Info  Sight, Hearing, Speech Sight Info: Impaired Hearing Info: Adequate Speech Info: Adequate    SPECIAL CARE FACTORS FREQUENCY  PT (By licensed PT), OT (By licensed OT)     PT Frequency: min 5x weekly OT Frequency: min 5x weekly            Contractures Contractures Info: Not present    Additional Factors Info  Code Status, Allergies Code Status Info: Partial Allergies Info: Metformin and  related           Current Medications (05/09/2019):  This is the current hospital active medication list Current Facility-Administered Medications  Medication Dose Route Frequency Provider Last Rate Last Dose  . 0.9 %  sodium chloride infusion   Intravenous Continuous Marty Heck, MD 10 mL/hr at 05/02/19 0636    . 0.9 %  sodium chloride infusion  250 mL Intravenous PRN Marty Heck, MD      . acetaminophen (TYLENOL) tablet 650 mg  650 mg Oral Q4H PRN Marty Heck, MD   650 mg at 04/30/19 1948  . aspirin EC tablet 81 mg  81 mg Oral Daily Marty Heck, MD   81 mg at 05/09/19 1015  . atorvastatin (LIPITOR) tablet 40 mg  40 mg Oral q1800 Marty Heck, MD   40 mg at 05/08/19 1813  . calcitRIOL (ROCALTROL) capsule 0.5 mcg  0.5 mcg Oral Q M,W,F Marty Heck, MD   0.5 mcg at 05/08/19 1021  . Chlorhexidine Gluconate Cloth 2 % PADS 6 each  6 each Topical Q0600 Marty Heck, MD   6 each at 05/07/19 [phone]  . Chlorhexidine Gluconate Cloth 2 % PADS 6 each  6 each Topical Q0600 Roney Jaffe, MD   6 each at 05/09/19 0617  . citalopram (CELEXA) tablet 20 mg  20 mg Oral Daily Marty Heck, MD   20 mg at 05/09/19 1015  . Darbepoetin Alfa (ARANESP) injection 150 mcg  150 mcg Intravenous Q Mon-HD Pearson Grippe B, MD   150 mcg at 05/06/19 1056  . docusate sodium (COLACE) capsule 100 mg  100 mg Oral Daily PRN Marty Heck, MD   100 mg at 05/08/19 1026  . erythromycin (E-MYCIN) tablet 250 mg  250 mg Oral TID Marty Heck, MD   250 mg at 05/09/19 1014  . gabapentin (NEURONTIN) tablet 300 mg  300 mg Oral BID Marty Heck, MD   300 mg at 05/09/19 1014  . hydrALAZINE (APRESOLINE) injection 5 mg  5 mg Intravenous Q20 Min PRN Marty Heck, MD      . hydrALAZINE (APRESOLINE) tablet 25 mg  25 mg Oral 3 times per day on Sun Tue Thu Sat Marty Heck, MD   25 mg at 05/09/19 1014  . insulin aspart (novoLOG) injection 0-9  Units  0-9 Units Subcutaneous TID WC Marty Heck, MD   2 Units at 05/08/19 1813  . insulin aspart (novoLOG) injection 7 Units  7 Units Subcutaneous Q supper Marty Heck, MD   7 Units at 05/08/19 1814  . insulin glargine (LANTUS) injection 10 Units  10 Units Subcutaneous QHS Marty Heck, MD   10 Units at 05/08/19 2103  . labetalol (NORMODYNE) injection 10 mg  10 mg Intravenous Q10 min PRN Marty Heck, MD      . LORazepam (ATIVAN) tablet 0.5 mg  0.5 mg Oral BID Marty Heck, MD   0.5 mg at 05/09/19 1015  . MEDLINE  mouth rinse  15 mL Mouth Rinse BID Marty Heck, MD   15 mL at 05/05/19 3606  . metoCLOPramide (REGLAN) tablet 10 mg  10 mg Oral TID WC Marty Heck, MD   10 mg at 05/09/19 0617  . midodrine (PROAMATINE) tablet 5 mg  5 mg Oral Q M,W,F Marty Heck, MD   5 mg at 05/08/19 1022  . multivitamin (RENA-VIT) tablet 1 tablet  1 tablet Oral QHS Marty Heck, MD   1 tablet at 05/08/19 2103  . ondansetron (ZOFRAN) injection 4 mg  4 mg Intravenous Q6H PRN Marty Heck, MD   4 mg at 04/29/19 0517  . oxyCODONE (Oxy IR/ROXICODONE) immediate release tablet 5 mg  5 mg Oral Q6H PRN Marty Heck, MD   5 mg at 05/03/19 1313  . promethazine (PHENERGAN) injection 12.5-25 mg  12.5-25 mg Intravenous Q6H PRN Marty Heck, MD   25 mg at 05/07/19 0829  . promethazine (PHENERGAN) tablet 12.5 mg  12.5 mg Oral Q6H PRN Marty Heck, MD      . sodium chloride flush (NS) 0.9 % injection 3 mL  3 mL Intravenous Q12H Marty Heck, MD   3 mL at 05/09/19 1016  . sodium chloride flush (NS) 0.9 % injection 3 mL  3 mL Intravenous PRN Marty Heck, MD      . traMADol Veatrice Bourbon) tablet 50 mg  50 mg Oral Q6H Marty Heck, MD   50 mg at 05/09/19 0617  . warfarin (COUMADIN) tablet 5 mg  5 mg Oral ONCE-1800 Einar Grad, [facility]      . Warfarin - Pharmacist Dosing Inpatient   Does not apply q1800 Larey Dresser, MD         Discharge Medications: Please see discharge summary for a list of discharge medications.  Relevant Imaging Results:  Relevant Lab Results:   Additional Information SSN: [id number]  Dialyzes on MWF schedule at [facility]. Has an LVAD (SNF staff would require training).  Benard Halsted, LCSW

## 2019-05-09 NOTE — Progress Notes (Signed)
   Right foot remains warm and appears to be healing. Left great toe demarcating. When patient discharges I will see him back in a few weeks for wound checks.   [name] C. Donzetta Matters, MD [facility] Office: [phone] Pager: [phone]

## 2019-05-09 NOTE — Progress Notes (Signed)
ANTICOAGULATION CONSULT NOTE - Follow Up Consult  Pharmacy Consult for Heparin>warfarin Indication: LVAD  Allergies  Allergen Reactions  . Metformin And Related Diarrhea    Patient Measurements: Height: 5' 7.01" (170.2 cm) Weight: 192 lb 14.4 oz (87.5 kg) IBW/kg (Calculated) : 66.12 Heparin Dosing Weight: 83.2 kg  Vital Signs: Temp: 99.5 F (37.5 C) (09/03 0250) Temp Source: Oral (09/03 0250) BP: 102/87 (09/03 0250) Pulse Rate: 90 (09/03 0250)  Labs: Recent Labs    05/07/19 0423  05/07/19 1005 05/08/19 0402 05/09/19 0334  HGB  --    < > 9.2* 9.1* 9.1*  HCT  --   --  29.5* 30.1* 30.5*  PLT  --   --  [phone]  LABPROT 22.2*  --   --  24.6* 28.5*  INR 2.0*  --   --  2.3* 2.7*  CREATININE 7.70*  --   --  9.31* 6.57*   < > = values in this interval not displayed.    Estimated Creatinine Clearance: 13.6 mL/min (A) (by C-G formula based on SCr of 6.57 mg/dL (H)).   Assessment: 59 yom w/ hx Heartmate 3 LVAD, factor V Leiden, and PAF on warfarin PTA admitted with ischemic foot now s/p transmetatarsal amputation on R foot 8/25 and stent 8/27.   INR remains therapeutic at 2.7, pt has been resumed on home dose. Will give reduced dose tonight with INR trending up and less appetite.  **Home regimen 5 mg daily except 7.5 mg on Tuesday/Thurs  Goal of Therapy:  INR: 2-3 Monitor platelets by anticoagulation protocol: Yes   Plan:  -Warfarin 71m PO x1 tonight -Daily protime   MArrie Senate PharmD, BCPS Clinical Pharmacist [phone] check AMION for all [place] 05/09/2019

## 2019-05-09 NOTE — Plan of Care (Signed)
  Problem: Activity: Goal: Risk for activity intolerance will decrease Outcome: Progressing   Problem: Pain Managment: Goal: General experience of comfort will improve Outcome: Progressing   Problem: Safety: Goal: Ability to remain free from injury will improve Outcome: Progressing   

## 2019-05-09 NOTE — Progress Notes (Signed)
Renal Navigator received message from CM/D. Lovena Le asked Navigator to call CSW/J. Tobe Sos, who is very familiar with patient due to his LVAD. Patient's insurance plan does not cover CIR. SNF has been discussed, however, according to Ms. Tobe Sos, there is only one SNF, [facility], in town able to accommodate an LVAD patient and their training will need to be updated prior to his admission. She states there is a good possibility, from discussing his case with the medical team, that he will be strong enough in the next few days, that he will be able to go home and not need rehab.  Renal Navigator explained that [facility] in the OP HD clinic trained to care for LVAD patients in this area and therefore, he needs to remain at this clinic. If he were to be admitted to [facility], it needs to be confirmed that he will be able to be transported to [facility] for OP HD treatment.  Ms. Tobe Sos states she will follow closely and keep Renal Navigator updated as patient's needs progress. Renal Navigator available for support and assistance.  Alphonzo Cruise, [place] Renal Navigator [phone]

## 2019-05-09 NOTE — Progress Notes (Signed)
LVAD Coordinator Rounding Note:  Pt admitted per Dr. Haroldine Laws 04/25/19 for severe right foot pain. Plan for surgical intervention with Dr Donzetta Matters 04/30/19.   HM III LVAD implanted on 06/12/17 by Dr. Prescott Gum under Destination Therapy criteria due to renal insufficiency. Pump exchange on 03/01/18 for outflow graft twist/obstruction.   Patient sitting up in chair after ambulating in hallway with PT. Per PT patient's strength and balance has greatly improved over the last few days. Patient utilizing walker in hallway and with transfers in and out of bed.   Spoke with patient's son [name] [phone]) regarding plan to possibly discharge to home at the beginning of next week with home PT 3 days a week. [name] is agreeable to plan and states that he is fine with assisting with PT exercises on the days when HHPT is not at the home. Raquel Sarna CSW made aware of possible plan.   Vital signs: Temp:  98.4 HR:  86 Doppler Pressure: 102 Auto BP:  125/114 (119) O2 Sat: 100% on RA Wt: 186>192>187>190>188>190>198.1>192.9 lbs  LVAD interrogation reveals:  Speed: 5500 Flow: 4.3 Power: 4.4w PI: 3.6 Alarms: 1 low flow yesterday during dialysis Events: 40 PI events yesterday during dialysis Hematocrit: 30  Fixed speed: 5500 Low speed limit: 5200  Drive Line:  CDI., anchor intact and accurately applied. Weekly dressing kits. Next dressing change due 05/13/19, bedside nurse to change.   Labs: LDH trend: 195>177>170>170>168>148>152>170  INR trend: 1.6>1.8>1.8>1.6>1.4>1.5>1.4>2.2>2.7  WBC trend: 11.7>14.7>23.8>27.6>19.8>16.9  Infection:  Blood cx 08/22>> no growth - final  Anticoagulation Plan: - INR Goal: 2.0 - 3.0 - ASA Dose: 81 mg daily   Device: -St Jude dual  -Therapies: on 200 bpm - Monitor: on VT 150 bpm  Adverse Events on VAD: - ESRD post LVAD; started CVVH 06/15/17; HD started 06/20/17 - Hospitalization 11/5 - 07/16/17 > gastroparesis diagnosis after EGD - Hospitalization  11/26 - 08/04/17 > gastroparesis - Hospitalization 1/8 - 09/16/17>gastroparesis - referred to Dr. Corliss Parish at [facility] - 10/31/17>rightupper arm arteriovenous graft with Artegraft; ligation of right first stage brachial vein transposition  - 12/11/13 elective admission for thrombectomy for AV graft in RUA  - 12/20/17 > intractable N/V - 01/19/18 > intractable N/V sent by EMS from dialysis center - 03/01/18 > outflow graft twist/occulsion requiring pump exchange - 05/23/18 > admitted for non-working fistula in RUA - 06/10/18 > admitted for intractable N/V - 08/08/18 > admitted for peri-op management for 2nd stage of AV fistula, found to be sclerotic. New LUE graft created from brachial artery to axillary vein. Developed spontaneous LUE hematoma.  - 08/22/18 > admitted for intractable N/V - 08/28/18> admitted for intractable N/V/D and low INR - Admitted 7/10-7/17> admitted for right foot ischemia- 4th toe amputation per Dr Donzetta Matters  - Admitted 03/26/29> admitted for N/V severe foot pain  - Admitted 04/25/19> admitted for right foot amputation per Dr Donzetta Matters  Plan/Recommendations:  1. Please page VAD coordinator with any equipment questions or concerns.   [name] RN [place] Coordinator  Office: [phone]  24/7 Pager: [phone]

## 2019-05-09 NOTE — Progress Notes (Signed)
Patient ID: [name], male   DOB: 1965-12-19, 53 y.o.   MRN: [id number]   Advanced Heart Failure VAD Team Note  PCP-Cardiologist: No primary care provider on file.   Subjective:    Admitted for ischemic foot pain with need for amputation => transmetatarsal amputation right foot on 8/25.   Left leg angiogram 8/27: DES to left SFA.  1 vessel runoff via peroneal, severe disease with multilevel stenosis but unable to intervene.   Doing better with PT today, able to bear weight on right foot and walk farther.   LVAD INTERROGATION:  HeartMate 3 LVAD:   Flow 4.4 L  liters/min, speed 5500, power 4.4, PI 3.1. Several PI events with HD yesterday.      Objective:    Vital Signs:   Temp:  [98.4 F (36.9 C)-99.5 F (37.5 C)] 98.4 F (36.9 C) (09/03 0806) Pulse Rate:  [59-93] 90 (09/03 0250) Resp:  [15-16] 16 (09/03 0806) BP: (76-125)/(53-114) 125/114 (09/03 0806) SpO2:  [94 %-99 %] 96 % (09/03 0250) Weight:  [86.5 kg-89.4 kg] 87.5 kg (09/03 0250) Last BM Date: 04/25/19 Mean arterial Pressure 80s-90s  Intake/Output:   Intake/Output Summary (Last 24 hours) at 05/09/2019 1017 Last data filed at 05/08/2019 1656 Gross per 24 hour  Intake --  Output 3149 ml  Net -3149 ml     Physical Exam    General: Well appearing this am. NAD.  HEENT: Normal. Neck: Supple, JVP 7-8 cm. Carotids OK.  Cardiac:  Mechanical heart sounds with LVAD hum present.  Lungs:  CTAB, normal effort.  Abdomen:  NT, ND, no HSM. No bruits or masses. +BS  LVAD exit site: Well-healed and incorporated. Dressing dry and intact. No erythema or drainage. Stabilization device present and accurately applied. Driveline dressing changed daily per sterile technique. Extremities:  Warm and dry. No cyanosis, clubbing, rash, or edema. S/p right TMA.  Neuro:  Alert & oriented x 3. Cranial nerves grossly intact. Moves all 4 extremities w/o difficulty. Affect pleasant     Telemetry   Sinus 90s Personally reviewed   Labs    Basic Metabolic Panel: Recent Labs  Lab 05/03/19 0707 05/05/19 0352 05/06/19 0415 05/07/19 0423 05/08/19 0402 05/09/19 0334  NA 134* 130* 130* 132* 133* 136  K 4.3 3.7 4.1 4.0 4.3 4.1  CL 97* 92* 92* 96* 96* 99  CO2 25 25 22 23 22 23   GLUCOSE 164* 135* 112* 131* 108* 131*  BUN 47* 44* 60* 43* 57* 34*  CREATININE 9.00* 7.95* 10.09* 7.70* 9.31* 6.57*  CALCIUM 8.9 8.9 8.7* 9.2 9.4 8.8*  PHOS 5.3*  --   --   --   --   --     Liver Function Tests: Recent Labs  Lab 05/03/19 0707  ALBUMIN 2.3*   No results for input(s): LIPASE, AMYLASE in the last 168 hours. No results for input(s): AMMONIA in the last 168 hours.  CBC: Recent Labs  Lab 05/05/19 0352 05/06/19 0415 05/07/19 1005 05/08/19 0402 05/09/19 0334  WBC 19.6* 19.8* 18.9* 16.3* 16.9*  HGB 8.7* 8.3* 9.2* 9.1* 9.1*  HCT 28.2* 26.5* 29.5* 30.1* 30.5*  MCV 93.1 91.7 91.9 92.3 93.3  PLT 280 272 280 307 303    INR: Recent Labs  Lab 05/05/19 0352 05/06/19 0415 05/07/19 0423 05/08/19 0402 05/09/19 0334  INR 1.7* 2.2* 2.0* 2.3* 2.7*    Other results:    Imaging   No results found.   Medications:     Scheduled Medications:  aspirin  EC  81 mg Oral Daily   atorvastatin  40 mg Oral q1800   calcitRIOL  0.5 mcg Oral Q M,W,F   Chlorhexidine Gluconate Cloth  6 each Topical Q0600   Chlorhexidine Gluconate Cloth  6 each Topical Q0600   citalopram  20 mg Oral Daily   darbepoetin (ARANESP) injection - DIALYSIS  150 mcg Intravenous Q Mon-HD   erythromycin  250 mg Oral TID   gabapentin  300 mg Oral BID   hydrALAZINE  25 mg Oral 3 times per day on Sun Tue Thu Sat   insulin aspart  0-9 Units Subcutaneous TID WC   insulin aspart  7 Units Subcutaneous Q supper   insulin glargine  10 Units Subcutaneous QHS   LORazepam  0.5 mg Oral BID   mouth rinse  15 mL Mouth Rinse BID   metoCLOPramide  10 mg Oral TID WC   midodrine  5 mg Oral Q M,W,F   multivitamin  1 tablet Oral QHS   sodium chloride  flush  3 mL Intravenous Q12H   traMADol  50 mg Oral Q6H   warfarin  5 mg Oral Once per day on Sun Mon Wed Fri Sat   warfarin  7.5 mg Oral Once per day on Tue Thu   Warfarin - Pharmacist Dosing Inpatient   Does not apply q1800    Infusions:  sodium chloride 10 mL/hr at 05/02/19 0636   sodium chloride      PRN Medications: sodium chloride, acetaminophen, docusate sodium, hydrALAZINE, labetalol, ondansetron (ZOFRAN) IV, oxyCODONE, promethazine, promethazine, sodium chloride flush  Assessment/Plan:    1. PAD/ischemic feet: S/p R transmetatarsal amputation on 8/25.  WBCs up to 27 post-op but afebrile, may be reactive.  WBCs now trending down slowly, 16 today.  He completed vancomycin for ?osteomyelitis on 8/19 (was getting with HD). Cultures NGTD. Left great toe dusky, now s/p left leg angiogram with SFA PCI.  There was 1 vessel runoff via left peroneal with severe multilevel stenosis => unable to intervene on peroneal.  - Currently off abx.  2. Chronic systolic CHF: Ischemic cardiomyopathy. St Jude ICD. NSTEMI in March 2018 with DES to LAD and RCA, complicated by cardiogenic shock, low output requiring milrinone. Echo 8/18 with EF 20-25%, moderate MR. CPX (8/18) with severe functional impairment due to HF. He is s/p HM3 LVAD placement. He had pump thrombosis 6/19 due to [facility] outflow graft kinking. He had pump exchange for another HM3. INR2.7 today.  - Volume managed at dialysis, MWF.   - Continue ASA 81and warfarin with INR goal 2-2.5.   - He has been referred to [facility] for evaluation for heart/kidney transplantand recently saw Dr Mosetta Pigeon =>he is not candidate for transplantnowgiven extensive PAD.  - Continue hydralazine 25 mg tid on non-HD days, MAP in 80s-90s.  3. ESRD: HD on M-W-F with midodrine pre-HD.  4.  CAD: NSTEMI in 3/18. LHC with 99% ulcerated lesion proximal RCA with left to right collaterals, 95% mid LAD stenosis after mid LAD stent. s/p PCI to RCA and LAD on  11/25/16. No chest pain.  - Continue statin and ASA. 5. h/o DVTs: Factor V Leiden heterozygote. -Anticoagulated. 6. Diabetic gastroparesis: He has seen gastroparesis specialist at [facility]. Surprisingly, gastric emptying study was normal. However, electrogastrogram showed poor gastric accomodation/capacity. Therefore, small frequent meals recommended.Nausea earlier this admission, now resolved.  - Continue reglan 10 mg tid/ac and Marinol.  - Continue Protonix bid.  - He will continue erythromycinindefinitely.  - Limit narcotic use as  much as possible.  6. DM:SSI and home insulin regimen.  7. GBM:[id number] hydralazine on non-HD days.  8. Deconditioning: Continue PT/OT, needs these daily.  I strongly encouraged him to work on getting up. PT recommend CIR, but his insurance will not cover this.  He is doing better per PT, may be able to go home with home PT.  Will watch over weekend and if he continues to improve, aim for home on Monday after HD.   I reviewed the LVAD parameters from today, and compared the results to the patient's prior recorded data.  No programming changes were made.  The LVAD is functioning within specified parameters.  The patient performs LVAD self-test daily.  LVAD interrogation was negative for any significant power changes, alarms or PI events/speed drops.  LVAD equipment check completed and is in good working order.  Back-up equipment present.   LVAD education done on emergency procedures and precautions and reviewed exit site care.  Length of Stay: 48  Loralie Champagne, MD 05/09/2019, 10:17 AM  VAD Team --- VAD ISSUES ONLY--- Pager [phone] (7am - 7am)  Advanced Heart Failure Team  Pager [phone] (M-F; 7a - 4p)  Please contact [facility] for night-coverage after hours (4p -7a ) and weekends on [email or web address]

## 2019-05-09 NOTE — Progress Notes (Signed)
Admit: 04/26/2019 LOS: 64  59M ESRD w/ LVAD and ischemic R foot s/p TMA and LLE angiogram + stent  Subjective:   . 3 L off w/ HD yest  09/02 0701 - 09/03 0700 In: 120 [P.O.:120] Out: 3149   Filed Weights   05/08/19 1300 05/08/19 1656 05/09/19 0250  Weight: 89.4 kg 86.5 kg 87.5 kg    Scheduled Meds: . aspirin EC  81 mg Oral Daily  . atorvastatin  40 mg Oral q1800  . calcitRIOL  0.5 mcg Oral Q M,W,F  . Chlorhexidine Gluconate Cloth  6 each Topical Q0600  . Chlorhexidine Gluconate Cloth  6 each Topical Q0600  . citalopram  20 mg Oral Daily  . darbepoetin (ARANESP) injection - DIALYSIS  150 mcg Intravenous Q Mon-HD  . erythromycin  250 mg Oral TID  . gabapentin  300 mg Oral BID  . hydrALAZINE  25 mg Oral 3 times per day on Sun Tue Thu Sat  . insulin aspart  0-9 Units Subcutaneous TID WC  . insulin aspart  7 Units Subcutaneous Q supper  . insulin glargine  10 Units Subcutaneous QHS  . LORazepam  0.5 mg Oral BID  . mouth rinse  15 mL Mouth Rinse BID  . metoCLOPramide  10 mg Oral TID WC  . midodrine  5 mg Oral Q M,W,F  . multivitamin  1 tablet Oral QHS  . sodium chloride flush  3 mL Intravenous Q12H  . traMADol  50 mg Oral Q6H  . warfarin  5 mg Oral ONCE-1800  . Warfarin - Pharmacist Dosing Inpatient   Does not apply q1800   Continuous Infusions: . sodium chloride 10 mL/hr at 05/02/19 0636  . sodium chloride     PRN Meds:.sodium chloride, acetaminophen, docusate sodium, hydrALAZINE, labetalol, ondansetron (ZOFRAN) IV, oxyCODONE, promethazine, promethazine, sodium chloride flush  Current Labs: reviewed   Physical Exam:  Blood pressure (!) 125/114, pulse 90, temperature 98.4 F (36.9 C), temperature source Oral, resp. rate 16, height 5' 7.01" (1.702 m), weight 87.5 kg, SpO2 96 %. NAD COnt hum from LVAD RRR NO LEE R TMA bandaged AAO x3 TDC L IJ  Dialysis:  MWF [facility]  4h 49mn  400/800   84kg  2K/3Ca  TDC  Hep none - [name] 257m IV q HD - Mircera 20025mq 2 weeks  (last 8/18) - Venofer 43m61m weekly  Assessment/ Plan 1.  R foot ischemia/dry gangrene + ischemic L foot: s/p R TMA 8/25 and angio with stent of LLE 8/27.    2. ESRD: HD MWF. Midodrine pre HD and midHD keep MAP > 60-65.  3. Hypertension/volume: BP controlled. Vol excess improving, less swollen today, only 3kg over today. He says will try to stand for wt in am prior to HD tomorrow.  4.  Anemia:  Hgb down, resume Aranesp 150 with HD 8/31 5.  Metabolic bone disease: Ca OK, on C3 6.  CAD/ICM/LVAD: Per HF team 7.  Type 2 DM with known severe gastroparesis 8.  Dispo - needs rehab, looking for SNF  Rob Kelly Splinter 05/09/2019, 11:08 AM    Recent Labs  Lab 05/03/19 0707  05/07/19 0423 05/08/19 0402 05/09/19 0334  NA 134*   < > 132* 133* 136  K 4.3   < > 4.0 4.3 4.1  CL 97*   < > 96* 96* 99  CO2 25   < > 23 22 23   GLUCOSE 164*   < > 131* 108* 131*  BUN 47*   < >  43* 57* 34*  CREATININE 9.00*   < > 7.70* 9.31* 6.57*  CALCIUM 8.9   < > 9.2 9.4 8.8*  PHOS 5.3*  --   --   --   --    < > = values in this interval not displayed.   Recent Labs  Lab 05/07/19 1005 05/08/19 0402 05/09/19 0334  WBC 18.9* 16.3* 16.9*  HGB 9.2* 9.1* 9.1*  HCT 29.5* 30.1* 30.5*  MCV 91.9 92.3 93.3  PLT 280 307 303

## 2019-05-09 NOTE — TOC Progression Note (Signed)
Transition of Care [facility]) - Progression Note    Patient Details  Name: [name] MRN: [id number] Date of Birth: 1966/07/02  Transition of Care [facility]) CM/SW [place], LCSW Phone Number: 05/09/2019, 10:22 AM  Clinical Narrative:    CSW notified of need for SNF. CSW notes recommendation for [facility] due to LVAD, however, [facility] is not in network with patient's insurance. CSW reaching to heart failure CSW to see if there are any other SNFs that accept LVADs and dialysis.    Expected Discharge Plan: [place] Barriers to Discharge: No Barriers Identified  Expected Discharge Plan and Services Expected Discharge Plan: [place] In-house Referral: NA Discharge Planning Services: CM Consult Post Acute Care Choice: NA Living arrangements for the past 2 months: Single Family Home Expected Discharge Date: 05/07/19               DME Arranged: (NA)         HH Arranged: NA           Social Determinants of Health (SDOH) Interventions    Readmission Risk Interventions Readmission Risk Prevention Plan 03/20/2019  Transportation Screening Complete  Medication Review Press photographer) Complete  HRI or [place] Complete  SW Recovery Care/Counseling Consult Complete  [place] Not Applicable  Some recent data might be hidden

## 2019-05-09 NOTE — TOC Progression Note (Signed)
Transition of Care [facility]) - Progression Note    Patient Details  Name: [name] MRN: [id number] Date of Birth: 04-Apr-1966  Transition of Care [facility]) CM/SW [place], LCSW Phone Number: 05/09/2019, 10:34 AM  Clinical Narrative:    CSW discussed case with LVAD CSW, Kennyth Lose [phone]). She reports that [facility] is the only SNF to accept LVADs at this time. She will contact CSW back once they determine if patient is now strong enough to return home with family. CSW to continue to follow.    Expected Discharge Plan: [place] Barriers to Discharge: No Barriers Identified  Expected Discharge Plan and Services Expected Discharge Plan: [place] In-house Referral: NA Discharge Planning Services: CM Consult Post Acute Care Choice: NA Living arrangements for the past 2 months: Single Family Home Expected Discharge Date: 05/07/19               DME Arranged: (NA)         HH Arranged: NA           Social Determinants of Health (SDOH) Interventions    Readmission Risk Interventions Readmission Risk Prevention Plan 03/20/2019  Transportation Screening Complete  Medication Review Press photographer) Complete  HRI or [place] Complete  SW Recovery Care/Counseling Consult Complete  [place] Not Applicable  Some recent data might be hidden

## 2019-05-10 LAB — BASIC METABOLIC PANEL
Anion gap: 13 (ref 5–15)
BUN: 49 mg/dL — ABNORMAL HIGH (ref 6–20)
CO2: 23 mmol/L (ref 22–32)
Calcium: 9.2 mg/dL (ref 8.9–10.3)
Chloride: 98 mmol/L (ref 98–111)
Creatinine, Ser: 8.66 mg/dL — ABNORMAL HIGH (ref 0.61–1.24)
GFR calc Af Amer: 7 mL/min — ABNORMAL LOW (ref >60)
GFR calc non Af Amer: 6 mL/min — ABNORMAL LOW (ref >60)
Glucose, Bld: 159 mg/dL — ABNORMAL HIGH (ref 70–99)
Potassium: 4.5 mmol/L (ref 3.5–5.1)
Sodium: 134 mmol/L — ABNORMAL LOW (ref 135–145)

## 2019-05-10 LAB — LACTATE DEHYDROGENASE: LDH: 157 U/L (ref 98–192)

## 2019-05-10 LAB — CBC
HCT: 29.4 % — ABNORMAL LOW (ref 39.0–52.0)
Hemoglobin: 8.7 g/dL — ABNORMAL LOW (ref 13.0–17.0)
MCH: 28.2 pg (ref 26.0–34.0)
MCHC: 29.6 g/dL — ABNORMAL LOW (ref 30.0–36.0)
MCV: 95.1 fL (ref 80.0–100.0)
Platelets: 313 10*3/uL (ref 150–400)
RBC: 3.09 MIL/uL — ABNORMAL LOW (ref 4.22–5.81)
RDW: 17.6 % — ABNORMAL HIGH (ref 11.5–15.5)
WBC: 16 10*3/uL — ABNORMAL HIGH (ref 4.0–10.5)
nRBC: 0.3 % — ABNORMAL HIGH (ref 0.0–0.2)

## 2019-05-10 LAB — GLUCOSE, CAPILLARY
Glucose-Capillary: 169 mg/dL — ABNORMAL HIGH (ref 70–99)
Glucose-Capillary: 140 mg/dL — ABNORMAL HIGH (ref 70–99)
Glucose-Capillary: 131 mg/dL — ABNORMAL HIGH (ref 70–99)
Glucose-Capillary: 141 mg/dL — ABNORMAL HIGH (ref 70–99)

## 2019-05-10 LAB — PROTIME-INR
INR: 3 — ABNORMAL HIGH (ref 0.8–1.2)
Prothrombin Time: 30.8 seconds — ABNORMAL HIGH (ref 11.4–15.2)

## 2019-05-10 MED ORDER — HEPARIN SODIUM (PORCINE) 1000 UNIT/ML IJ SOLN
INTRAMUSCULAR | Status: AC
  Filled 2019-05-10: qty 4

## 2019-05-10 MED ORDER — CALCITRIOL 0.5 MCG PO CAPS
ORAL_CAPSULE | ORAL | Status: AC
  Filled 2019-05-10: qty 1

## 2019-05-10 MED ORDER — BISACODYL 5 MG PO TBEC
10.0000 mg | DELAYED_RELEASE_TABLET | Freq: Every day | ORAL | Status: DC | PRN
  Administered 2019-05-10 – 2019-05-11 (×2): 10 mg via ORAL
  Filled 2019-05-10 (×2): qty 2

## 2019-05-10 MED ORDER — MIDODRINE HCL 5 MG PO TABS
ORAL_TABLET | ORAL | Status: AC
  Filled 2019-05-10: qty 1

## 2019-05-10 MED ORDER — WARFARIN SODIUM 2.5 MG PO TABS
2.5000 mg | ORAL_TABLET | Freq: Once | ORAL | Status: AC
  Administered 2019-05-10: 18:00:00 2.5 mg via ORAL
  Filled 2019-05-10: qty 1

## 2019-05-10 NOTE — Progress Notes (Signed)
ANTICOAGULATION CONSULT NOTE - Follow Up Consult  Pharmacy Consult for warfarin Indication: LVAD  Allergies  Allergen Reactions  . Metformin And Related Diarrhea    Patient Measurements: Height: 5' 7.01" (170.2 cm) Weight: 193 lb 5.5 oz (87.7 kg) IBW/kg (Calculated) : 66.12 Heparin Dosing Weight: 83.2 kg  Vital Signs: Temp: 97.8 F (36.6 C) (09/04 0337) Temp Source: Oral (09/04 0337) BP: 110/97 (09/04 0337) Pulse Rate: 80 (09/04 0337)  Labs: Recent Labs    05/08/19 0402 05/09/19 0334 05/10/19 0500  HGB 9.1* 9.1* 8.7*  HCT 30.1* 30.5* 29.4*  PLT 307 303 313  LABPROT 24.6* 28.5* 30.8*  INR 2.3* 2.7* 3.0*  CREATININE 9.31* 6.57* 8.66*    Estimated Creatinine Clearance: 10.3 mL/min (A) (by C-G formula based on SCr of 8.66 mg/dL (H)).   Assessment: 5 yom w/ hx Heartmate 3 LVAD, factor V Leiden, and PAF on warfarin PTA admitted with ischemic foot now s/p transmetatarsal amputation on R foot 8/25 and stent 8/27.   INR therapeutic at upper end at 3.0, pt has been resumed on home dose but continues to trend up, may need to back down on dose today.   **Home regimen 5 mg daily except 7.5 mg on Tuesday/Thurs  Goal of Therapy:  INR: 2-3 Monitor platelets by anticoagulation protocol: Yes   Plan:  -Warfarin 2.50m PO x1 tonight then plan to resume home dose tomorrow -Daily protime  FErin HearingPharmD., BCPS Clinical Pharmacist 05/10/2019 8:11 AM

## 2019-05-10 NOTE — Progress Notes (Signed)
Patient ID: [name], male   DOB: 1966/05/04, 53 y.o.   MRN: [id number]   Advanced Heart Failure VAD Team Note  PCP-Cardiologist: No primary care provider on file.   Subjective:    Admitted for ischemic foot pain with need for amputation => transmetatarsal amputation right foot on 8/25.   Left leg angiogram 8/27: DES to left SFA.  1 vessel runoff via peroneal, severe disease with multilevel stenosis but unable to intervene.   Did better with PT yesterday, walked to desk.  Has not had today.  Had HD today, no problems.   LVAD INTERROGATION:  HeartMate 3 LVAD:   Flow 4.0 L  liters/min, speed 5500, power 4.5, PI 5.0. 1 PI event.      Objective:    Vital Signs:   Temp:  [97.8 F (36.6 C)-98.8 F (37.1 C)] 98.5 F (36.9 C) (09/04 1258) Pulse Rate:  [75-94] 84 (09/04 1258) Resp:  [13-21] 20 (09/04 1110) BP: (83-158)/(53-101) 106/89 (09/04 1258) SpO2:  [87 %-97 %] 95 % (09/04 1258) Weight:  [85.1 kg-87.7 kg] 85.1 kg (09/04 1110) Last BM Date: 05/02/19 Mean arterial Pressure 90s  Intake/Output:   Intake/Output Summary (Last 24 hours) at 05/10/2019 1347 Last data filed at 05/10/2019 1110 Gross per 24 hour  Intake -  Output 2693 ml  Net -2693 ml     Physical Exam   General: Well appearing this am. NAD.  HEENT: Normal. Neck: Supple, JVP 7-8 cm. Carotids OK.  Cardiac:  Mechanical heart sounds with LVAD hum present.  Lungs:  CTAB, normal effort.  Abdomen:  NT, ND, no HSM. No bruits or masses. +BS  LVAD exit site: Well-healed and incorporated. Dressing dry and intact. No erythema or drainage. Stabilization device present and accurately applied. Driveline dressing changed daily per sterile technique. Extremities:  Warm and dry. No cyanosis, clubbing, rash, or edema.  Neuro:  Alert & oriented x 3. Cranial nerves grossly intact. Moves all 4 extremities w/o difficulty. Affect pleasant     Telemetry   Sinus 90s Personally reviewed   Labs   Basic Metabolic Panel:  Recent Labs  Lab 05/06/19 0415 05/07/19 0423 05/08/19 0402 05/09/19 0334 05/10/19 0500  NA 130* 132* 133* 136 134*  K 4.1 4.0 4.3 4.1 4.5  CL 92* 96* 96* 99 98  CO2 22 23 22 23 23   GLUCOSE 112* 131* 108* 131* 159*  BUN 60* 43* 57* 34* 49*  CREATININE 10.09* 7.70* 9.31* 6.57* 8.66*  CALCIUM 8.7* 9.2 9.4 8.8* 9.2    Liver Function Tests: No results for input(s): AST, ALT, ALKPHOS, BILITOT, PROT, ALBUMIN in the last 168 hours. No results for input(s): LIPASE, AMYLASE in the last 168 hours. No results for input(s): AMMONIA in the last 168 hours.  CBC: Recent Labs  Lab 05/06/19 0415 05/07/19 1005 05/08/19 0402 05/09/19 0334 05/10/19 0500  WBC 19.8* 18.9* 16.3* 16.9* 16.0*  HGB 8.3* 9.2* 9.1* 9.1* 8.7*  HCT 26.5* 29.5* 30.1* 30.5* 29.4*  MCV 91.7 91.9 92.3 93.3 95.1  PLT 272 280 307 303 313    INR: Recent Labs  Lab 05/06/19 0415 05/07/19 0423 05/08/19 0402 05/09/19 0334 05/10/19 0500  INR 2.2* 2.0* 2.3* 2.7* 3.0*    Other results:    Imaging   No results found.   Medications:     Scheduled Medications: . aspirin EC  81 mg Oral Daily  . atorvastatin  40 mg Oral q1800  . calcitRIOL      . calcitRIOL  0.5 mcg  Oral Q M,W,F  . Chlorhexidine Gluconate Cloth  6 each Topical Q0600  . Chlorhexidine Gluconate Cloth  6 each Topical Q0600  . citalopram  20 mg Oral Daily  . darbepoetin (ARANESP) injection - DIALYSIS  150 mcg Intravenous Q Mon-HD  . erythromycin  250 mg Oral TID  . gabapentin  300 mg Oral BID  . heparin      . hydrALAZINE  25 mg Oral 3 times per day on Sun Tue Thu Sat  . insulin aspart  0-9 Units Subcutaneous TID WC  . insulin aspart  7 Units Subcutaneous Q supper  . insulin glargine  10 Units Subcutaneous QHS  . LORazepam  0.5 mg Oral BID  . mouth rinse  15 mL Mouth Rinse BID  . metoCLOPramide  10 mg Oral TID WC  . midodrine      . midodrine  5 mg Oral Q M,W,F  . multivitamin  1 tablet Oral QHS  . sodium chloride flush  3 mL Intravenous  Q12H  . traMADol  50 mg Oral Q6H  . warfarin  2.5 mg Oral ONCE-1800  . Warfarin - Pharmacist Dosing Inpatient   Does not apply q1800    Infusions: . sodium chloride 10 mL/hr at 05/02/19 0636  . sodium chloride      PRN Medications: sodium chloride, acetaminophen, bisacodyl, docusate sodium, hydrALAZINE, labetalol, ondansetron (ZOFRAN) IV, oxyCODONE, promethazine, promethazine, sodium chloride flush  Assessment/Plan:    1. PAD/ischemic feet: S/p R transmetatarsal amputation on 8/25.  WBCs up to 27 post-op but afebrile, may be reactive.  WBCs now trending down slowly, 16 today.  He completed vancomycin for ?osteomyelitis on 8/19 (was getting with HD). Cultures NGTD. Left great toe dusky, now s/p left leg angiogram with SFA PCI.  There was 1 vessel runoff via left peroneal with severe multilevel stenosis => unable to intervene on peroneal.  - Currently off abx.  - Dr. Donzetta Matters following.  2. Chronic systolic CHF: Ischemic cardiomyopathy. St Jude ICD. NSTEMI in March 2018 with DES to LAD and RCA, complicated by cardiogenic shock, low output requiring milrinone. Echo 8/18 with EF 20-25%, moderate MR. CPX (8/18) with severe functional impairment due to HF. He is s/p HM3 LVAD placement. He had pump thrombosis 6/19 due to [facility] outflow graft kinking. He had pump exchange for another HM3. INR3.0 today.  - Volume managed at dialysis, MWF.   - Continue ASA 81and warfarin with INR goal 2-2.5.   - He has been referred [facility] for evaluation for heart/kidney transplantand recently saw Dr Mosetta Pigeon =>he is not candidate for transplantnowgiven extensive PAD.  - Continue hydralazine 25 mg tid on non-HD days, MAP in 80s-90s.  3. ESRD: HD on M-W-F with midodrine pre-HD.  4.  CAD: NSTEMI in 3/18. LHC with 99% ulcerated lesion proximal RCA with left to right collaterals, 95% mid LAD stenosis after mid LAD stent. s/p PCI to RCA and LAD on 11/25/16. No chest pain.  - Continue statin and ASA. 5. h/o DVTs:  Factor V Leiden heterozygote. -Anticoagulated. 6. Diabetic gastroparesis: He has seen gastroparesis specialist at [facility]. Surprisingly, gastric emptying study was normal. However, electrogastrogram showed poor gastric accomodation/capacity. Therefore, small frequent meals recommended.Nausea earlier this admission, now resolved.  - Continue reglan 10 mg tid/ac and Marinol.  - Continue Protonix bid.  - He will continue erythromycinindefinitely.  - Limit narcotic use as much as possible.  6. DM:SSI and home insulin regimen.  7. XLK:[id number] hydralazine on non-HD days.  8. Deconditioning: Continue  PT/OT, needs these daily.  I strongly encouraged him to work on getting up. PT recommend CIR, but his insurance will not cover this.  He is doing better per PT, may be able to go home with home PT.  Will watch over weekend and if he continues to improve, aim for home on Monday after HD.   I reviewed the LVAD parameters from today, and compared the results to the patient's prior recorded data.  No programming changes were made.  The LVAD is functioning within specified parameters.  The patient performs LVAD self-test daily.  LVAD interrogation was negative for any significant power changes, alarms or PI events/speed drops.  LVAD equipment check completed and is in good working order.  Back-up equipment present.   LVAD education done on emergency procedures and precautions and reviewed exit site care.  Length of Stay: [place], MD 05/10/2019, 1:47 PM  VAD Team --- VAD ISSUES ONLY--- Pager [phone] (7am - 7am)  Advanced Heart Failure Team  Pager [phone] (M-F; 7a - 4p)  Please contact [facility] for night-coverage after hours (4p -7a ) and weekends on [email or web address]

## 2019-05-10 NOTE — Progress Notes (Signed)
   I have seen and evaluated patient.  Right metatarsal amputation appears to be healing well and is warm.  Left great toe is demarcating.  If he is still here Tuesday I will see him again otherwise I will have him follow-up in short interval in the office for wound check.  Oralee Rapaport C. Donzetta Matters, MD [name] Office: [phone] Pager: [phone]

## 2019-05-10 NOTE — Progress Notes (Signed)
LVAD Coordinator Rounding Note:  Pt admitted per Dr. Haroldine Laws 04/25/19 for severe right foot pain. Plan for surgical intervention with Dr Donzetta Matters 04/30/19.   HM III LVAD implanted on 06/12/17 by Dr. Prescott Gum under Destination Therapy criteria due to renal insufficiency. Pump exchange on 03/01/18 for outflow graft twist/obstruction.   Patient sitting up in bed after returning from dialysis. Patient feels great today. Plan for possible discharge home at the beginning of the week. Plan for HHPT 3 days per week and patient's son to work with him 2 days a week.   Vital signs: Temp:  98.8  HR:  82 Doppler Pressure: 102 Auto BP:  106/89 (97) O2 Sat: 96% on RA Wt: 186>192>187>190>188>190>198.1>192.9>187.6 lbs  LVAD interrogation reveals:  Speed: 5500 Flow: 4.0 Power: 4.5w PI: 4.8 Alarms: none Events: none Hematocrit: 30  Fixed speed: 5500 Low speed limit: 5200  Drive Line:  CDI., anchor intact and accurately applied. Weekly dressing kits. Next dressing change due 05/13/19, bedside nurse to change.   Labs: LDH trend: 195>177>170>170>168>148>152>170>157  INR trend: 1.6>1.8>1.8>1.6>1.4>1.5>1.4>2.2>2.7>3.0  WBC trend: 11.7>14.7>23.8>27.6>19.8>16.9>16.0  Infection:  Blood cx 08/22>> no growth - final  Anticoagulation Plan: - INR Goal: 2.0 - 3.0 - ASA Dose: 81 mg daily   Device: -St Jude dual  -Therapies: on 200 bpm - Monitor: on VT 150 bpm  Adverse Events on VAD: - ESRD post LVAD; started CVVH 06/15/17; HD started 06/20/17 - Hospitalization 11/5 - 07/16/17 > gastroparesis diagnosis after EGD - Hospitalization 11/26 - 08/04/17 > gastroparesis - Hospitalization 1/8 - 09/16/17>gastroparesis - referred to Dr. Corliss Parish at [facility] - 10/31/17>rightupper arm arteriovenous graft with Artegraft; ligation of right first stage brachial vein transposition  - 12/11/13 elective admission for thrombectomy for AV graft in RUA  - 12/20/17 > intractable N/V - 01/19/18 > intractable N/V sent by EMS from  dialysis center - 03/01/18 > outflow graft twist/occulsion requiring pump exchange - 05/23/18 > admitted for non-working fistula in RUA - 06/10/18 > admitted for intractable N/V - 08/08/18 > admitted for peri-op management for 2nd stage of AV fistula, found to be sclerotic. New LUE graft created from brachial artery to axillary vein. Developed spontaneous LUE hematoma.  - 08/22/18 > admitted for intractable N/V - 08/28/18> admitted for intractable N/V/D and low INR - Admitted 7/10-7/17> admitted for right foot ischemia- 4th toe amputation per Dr Donzetta Matters  - Admitted 03/26/29> admitted for N/V severe foot pain  - Admitted 04/25/19> admitted for right foot amputation per Dr Donzetta Matters  Plan/Recommendations:  1. Please page VAD coordinator with any equipment questions or concerns.   [name] RN [place] Coordinator  Office: [phone]  24/7 Pager: [phone]

## 2019-05-10 NOTE — Progress Notes (Signed)
Inpatient Diabetes Program Recommendations  AACE/ADA: New Consensus Statement on Inpatient Glycemic Control   Target Ranges:  Prepandial:   less than 140 mg/dL      Peak postprandial:   less than 180 mg/dL (1-2 hours)      Critically ill patients:  140 - 180 mg/dL   Results for [name], [name] (MRN [id number]) as of 05/10/2019 11:55  Ref. Range 05/09/2019 06:18 05/09/2019 11:20 05/09/2019 15:47 05/09/2019 21:18 05/10/2019 05:53  Glucose-Capillary Latest Ref Range: 70 - 99 mg/dL 87 316 (H) 102 (H) 189 (H) 169 (H)   Review of Glycemic Control   Outpatient Diabetes medications: Lantus 10 units QHS, Novolog 7 units daily with supper Current orders for Inpatient glycemic control: Lantus 10 units QHS, Novolog 7 units with super, Novolog 0-9 units TID with meals  Inpatient Diabetes Program Recommendations:   Diet: Currently ordered Regular diet. If appropriate, please change to Carb Modified Heart Healthy Renal diet.  Thanks, Barnie Alderman, RN, MSN, CDE Diabetes Coordinator Inpatient Diabetes Program [phone] (Team Pager from 8am to 5pm)

## 2019-05-10 NOTE — Progress Notes (Signed)
OT Cancellation Note  Patient Details Name: TIMOHTY RENBARGER MRN: [id number] DOB: July 12, 1966   Cancelled Treatment:    Reason Eval/Treat Not Completed: Patient at procedure or test/ unavailable.  Pt in HD, will continue attempts.  Lucille Passy, OTR/L Acute Rehabilitation Services Pager [phone] Office [phone]   Lucille Passy M 05/10/2019, 11:25 AM

## 2019-05-10 NOTE — Progress Notes (Signed)
Admit: 04/26/2019 LOS: 72  41M ESRD w/ LVAD and ischemic R foot s/p TMA and LLE angiogram + stent  Subjective:   . Seen on HD today, no c/o  No intake/output data recorded.  Filed Weights   05/10/19 0542 05/10/19 0659 05/10/19 1110  Weight: 87.7 kg 87.7 kg 85.1 kg    Scheduled Meds: . aspirin EC  81 mg Oral Daily  . atorvastatin  40 mg Oral q1800  . calcitRIOL      . calcitRIOL  0.5 mcg Oral Q M,W,F  . Chlorhexidine Gluconate Cloth  6 each Topical Q0600  . Chlorhexidine Gluconate Cloth  6 each Topical Q0600  . citalopram  20 mg Oral Daily  . darbepoetin (ARANESP) injection - DIALYSIS  150 mcg Intravenous Q Mon-HD  . erythromycin  250 mg Oral TID  . gabapentin  300 mg Oral BID  . heparin      . hydrALAZINE  25 mg Oral 3 times per day on Sun Tue Thu Sat  . insulin aspart  0-9 Units Subcutaneous TID WC  . insulin aspart  7 Units Subcutaneous Q supper  . insulin glargine  10 Units Subcutaneous QHS  . LORazepam  0.5 mg Oral BID  . mouth rinse  15 mL Mouth Rinse BID  . metoCLOPramide  10 mg Oral TID WC  . midodrine      . midodrine  5 mg Oral Q M,W,F  . multivitamin  1 tablet Oral QHS  . sodium chloride flush  3 mL Intravenous Q12H  . traMADol  50 mg Oral Q6H  . warfarin  2.5 mg Oral ONCE-1800  . Warfarin - Pharmacist Dosing Inpatient   Does not apply q1800   Continuous Infusions: . sodium chloride 10 mL/hr at 05/02/19 0636  . sodium chloride     PRN Meds:.sodium chloride, acetaminophen, docusate sodium, hydrALAZINE, labetalol, ondansetron (ZOFRAN) IV, oxyCODONE, promethazine, promethazine, sodium chloride flush  Current Labs: reviewed   Physical Exam:  Blood pressure 106/89, pulse 84, temperature 98.8 F (37.1 C), temperature source Oral, resp. rate 20, height 5' 7.01" (1.702 m), weight 85.1 kg, SpO2 96 %. NAD COnt hum from LVAD RRR NO LEE R TMA bandaged AAO x3 TDC L IJ  Dialysis:  MWF [facility]  4h 64mn  400/800   84kg  2K/3Ca  TDC  Hep none - Hectoral 234m IV q  HD - Mircera 20063mq 2 weeks (last 8/18) - Venofer 46m25m weekly  Assessment/ Plan 1.  R foot ischemia/dry gangrene + ischemic L foot: s/p R TMA 8/25 and angio with stent of LLE 8/27.    2. ESRD: HD MWF. HD today.  Midodrine pre HD and midHD keep MAP > 60-65.  3. Hypertension/volume: BP controlled. Vol excess mostly resolved.  4.  Anemia:  Hgb down, resume Aranesp 150 with HD 8/31 5.  Metabolic bone disease: Ca OK, on C3 6.  CAD/ICM/LVAD: Per HF team 7.  Type 2 DM with known severe gastroparesis 8.  Dispo - needs rehab, looking for SNF  Rob Kelly Splinter 05/10/2019, 12:32 PM    Recent Labs  Lab 05/08/19 0402 05/09/19 0334 05/10/19 0500  NA 133* 136 134*  K 4.3 4.1 4.5  CL 96* 99 98  CO2 22 23 23   GLUCOSE 108* 131* 159*  BUN 57* 34* 49*  CREATININE 9.31* 6.57* 8.66*  CALCIUM 9.4 8.8* 9.2   Recent Labs  Lab 05/08/19 0402 05/09/19 0334 05/10/19 0500  WBC 16.3* 16.9* 16.0*  HGB 9.1* 9.1* 8.7*  HCT 30.1* 30.5* 29.4*  MCV 92.3 93.3 95.1  PLT 307 303 313

## 2019-05-11 LAB — CBC
HCT: 30.7 % — ABNORMAL LOW (ref 39.0–52.0)
Hemoglobin: 9.2 g/dL — ABNORMAL LOW (ref 13.0–17.0)
MCH: 28.1 pg (ref 26.0–34.0)
MCHC: 30 g/dL (ref 30.0–36.0)
MCV: 93.9 fL (ref 80.0–100.0)
Platelets: 304 10*3/uL (ref 150–400)
RBC: 3.27 MIL/uL — ABNORMAL LOW (ref 4.22–5.81)
RDW: 17.5 % — ABNORMAL HIGH (ref 11.5–15.5)
WBC: 14.4 10*3/uL — ABNORMAL HIGH (ref 4.0–10.5)
nRBC: 0.2 % (ref 0.0–0.2)

## 2019-05-11 LAB — GLUCOSE, CAPILLARY
Glucose-Capillary: 116 mg/dL — ABNORMAL HIGH (ref 70–99)
Glucose-Capillary: 149 mg/dL — ABNORMAL HIGH (ref 70–99)
Glucose-Capillary: 131 mg/dL — ABNORMAL HIGH (ref 70–99)
Glucose-Capillary: 136 mg/dL — ABNORMAL HIGH (ref 70–99)

## 2019-05-11 LAB — PROTIME-INR
INR: 2.8 — ABNORMAL HIGH (ref 0.8–1.2)
Prothrombin Time: 29.3 seconds — ABNORMAL HIGH (ref 11.4–15.2)

## 2019-05-11 LAB — LACTATE DEHYDROGENASE: LDH: 152 U/L (ref 98–192)

## 2019-05-11 MED ORDER — CHLORHEXIDINE GLUCONATE CLOTH 2 % EX PADS
6.0000 | MEDICATED_PAD | Freq: Every day | CUTANEOUS | Status: DC
  Administered 2019-05-12: 6 via TOPICAL

## 2019-05-11 MED ORDER — WARFARIN SODIUM 5 MG PO TABS
5.0000 mg | ORAL_TABLET | Freq: Once | ORAL | Status: AC
  Administered 2019-05-11: 17:00:00 5 mg via ORAL
  Filled 2019-05-11: qty 1

## 2019-05-11 NOTE — Progress Notes (Signed)
Patient ID: [name], male   DOB: Jan 03, 1966, 53 y.o.   MRN: [id number]   Advanced Heart Failure VAD Team Note  PCP-Cardiologist: No primary care provider on file.   Subjective:    Admitted for ischemic foot pain with need for amputation => transmetatarsal amputation right foot on 8/25.   Left leg angiogram 8/27: DES to left SFA.  1 vessel runoff via peroneal, severe disease with multilevel stenosis but unable to intervene.   Says he feels ok this morning. Denies SOB or orthopnea. Foot feels ok. He is worried about it healing completely   LVAD INTERROGATION:  HeartMate 3 LVAD:   Flow 4.3 L  liters/min, speed 5500, power 4.4, PI 3.2. VAD interrogated personally. Parameters stable.   Objective:    Vital Signs:   Temp:  [98.1 F (36.7 C)-99.8 F (37.7 C)] 98.1 F (36.7 C) (09/05 0800) Pulse Rate:  [70-84] 82 (09/05 0330) Resp:  [14-20] 15 (09/05 0800) BP: (93-158)/(53-97) 106/96 (09/05 0800) SpO2:  [95 %-99 %] 98 % (09/05 0800) Weight:  [85.1 kg] 85.1 kg (09/04 1110) Last BM Date: 05/02/19 Mean arterial Pressure 80-90s  Intake/Output:   Intake/Output Summary (Last 24 hours) at 05/11/2019 1016 Last data filed at 05/11/2019 0900 Gross per 24 hour  Intake 240 ml  Output 2693 ml  Net -2453 ml     Physical Exam   General:  NAD.  HEENT: normal  Neck: supple. JVP not elevated. RIJ CVL  Carotids 2+ bilat; no bruits. No lymphadenopathy or thryomegaly appreciated. Cor: LVAD hum. Left chest perm cath  Lungs: Clear. Abdomen: osoft, nontender, non-distended. No hepatosplenomegaly. No bruits or masses. Good bowel sounds. Driveline site clean. Anchor in place.  Extremities: no cyanosis, clubbing, rash. Warm no edema  R transmet amp Neuro: alert & oriented x 3. No focal deficits. Moves all 4 without problem     Telemetry   Sinus 80-90s Personally reviewed   Labs   Basic Metabolic Panel: Recent Labs  Lab 05/06/19 0415 05/07/19 0423 05/08/19 0402 05/09/19 0334  05/10/19 0500  NA 130* 132* 133* 136 134*  K 4.1 4.0 4.3 4.1 4.5  CL 92* 96* 96* 99 98  CO2 22 23 22 23 23   GLUCOSE 112* 131* 108* 131* 159*  BUN 60* 43* 57* 34* 49*  CREATININE 10.09* 7.70* 9.31* 6.57* 8.66*  CALCIUM 8.7* 9.2 9.4 8.8* 9.2    Liver Function Tests: No results for input(s): AST, ALT, ALKPHOS, BILITOT, PROT, ALBUMIN in the last 168 hours. No results for input(s): LIPASE, AMYLASE in the last 168 hours. No results for input(s): AMMONIA in the last 168 hours.  CBC: Recent Labs  Lab 05/07/19 1005 05/08/19 0402 05/09/19 0334 05/10/19 0500 05/11/19 0418  WBC 18.9* 16.3* 16.9* 16.0* 14.4*  HGB 9.2* 9.1* 9.1* 8.7* 9.2*  HCT 29.5* 30.1* 30.5* 29.4* 30.7*  MCV 91.9 92.3 93.3 95.1 93.9  PLT 280 307 303 313 304    INR: Recent Labs  Lab 05/07/19 0423 05/08/19 0402 05/09/19 0334 05/10/19 0500 05/11/19 0418  INR 2.0* 2.3* 2.7* 3.0* 2.8*    Other results:    Imaging   No results found.   Medications:     Scheduled Medications: . aspirin EC  81 mg Oral Daily  . atorvastatin  40 mg Oral q1800  . calcitRIOL  0.5 mcg Oral Q M,W,F  . Chlorhexidine Gluconate Cloth  6 each Topical Q0600  . Chlorhexidine Gluconate Cloth  6 each Topical Q0600  . citalopram  20 mg  Oral Daily  . darbepoetin (ARANESP) injection - DIALYSIS  150 mcg Intravenous Q Mon-HD  . erythromycin  250 mg Oral TID  . gabapentin  300 mg Oral BID  . hydrALAZINE  25 mg Oral 3 times per day on Sun Tue Thu Sat  . insulin aspart  0-9 Units Subcutaneous TID WC  . insulin aspart  7 Units Subcutaneous Q supper  . insulin glargine  10 Units Subcutaneous QHS  . LORazepam  0.5 mg Oral BID  . mouth rinse  15 mL Mouth Rinse BID  . metoCLOPramide  10 mg Oral TID WC  . midodrine  5 mg Oral Q M,W,F  . multivitamin  1 tablet Oral QHS  . sodium chloride flush  3 mL Intravenous Q12H  . traMADol  50 mg Oral Q6H  . Warfarin - Pharmacist Dosing Inpatient   Does not apply q1800    Infusions: . sodium  chloride 10 mL/hr at 05/02/19 0636  . sodium chloride      PRN Medications: sodium chloride, acetaminophen, bisacodyl, docusate sodium, hydrALAZINE, labetalol, ondansetron (ZOFRAN) IV, oxyCODONE, promethazine, promethazine, sodium chloride flush  Assessment/Plan:    1. PAD/ischemic feet: S/p R transmetatarsal amputation on 8/25.  WBCs up to 27 post-op but afebrile, may be reactive.  WBCs now trending down slowly, 16 today.  He completed vancomycin for ?osteomyelitis on 8/19 (was getting with HD). Cultures NGTD. Left great toe dusky, now s/p left leg angiogram with SFA PCI.  There was 1 vessel runoff via left peroneal with severe multilevel stenosis => unable to intervene on peroneal.  - Currently off abx.  - Dr. Donzetta Matters following.  - Seems to be healing well with good tissue integrity so far 2. Chronic systolic CHF: Ischemic cardiomyopathy. St Jude ICD. NSTEMI in March 2018 with DES to LAD and RCA, complicated by cardiogenic shock, low output requiring milrinone. Echo 8/18 with EF 20-25%, moderate MR. CPX (8/18) with severe functional impairment due to HF. He is s/p HM3 LVAD placement. He had pump thrombosis 6/19 due to [facility] outflow graft kinking. He had pump exchange for another HM3. INR3.0 today.  - Volume managed at dialysis, MWF. Had HD yesterday without incident  - Continue ASA 81and warfarin with INR goal 2-2.5.  INR 2.8 today. LDH 152 - [facility] for evaluation for heart/kidney transplantand recently saw Dr Mosetta Pigeon =>he is not candidate for transplantnowgiven extensive PAD.  - Continue hydralazine 25 mg tid on non-HD days, MAP in 80s-90s.  3. ESRD: HD on M-W-F with midodrine pre-HD.  4.  CAD: NSTEMI in 3/18. LHC with 99% ulcerated lesion proximal RCA with left to right collaterals, 95% mid LAD stenosis after mid LAD stent. s/p PCI to RCA and LAD on 11/25/16. No chest pain.  - Continue statin and ASA. 5. h/o DVTs: Factor V Leiden heterozygote.  -Anticoagulated. 6. Diabetic gastroparesis: He has seen gastroparesis specialist at [facility]. Surprisingly, gastric emptying study was normal. However, electrogastrogram showed poor gastric accomodation/capacity. Therefore, small frequent meals recommended.Nausea earlier this admission, now resolved.  - Continue reglan 10 mg tid/ac and Marinol.  - Continue Protonix bid.  - He will continue erythromycinindefinitely.  - Limit narcotic use as much as possible.  - Doing well today. Tolerating po  6. DM:SSI and home insulin regimen.  7. ZDG:[id number] hydralazine on non-HD days.  8. Deconditioning: Continue PT/OT, needs these daily.  I strongly encouraged him to work on getting up. PT recommend CIR, but his insurance will not cover this.  He is doing better per PT, may be able to go home with home PT.  Will watch over weekend and if he continues to improve, aim for home on Monday after HD.   I reviewed the LVAD parameters from today, and compared the results to the patient's prior recorded data.  No programming changes were made.  The LVAD is functioning within specified parameters.  The patient performs LVAD self-test daily.  LVAD interrogation was negative for any significant power changes, alarms or PI events/speed drops.  LVAD equipment check completed and is in good working order.  Back-up equipment present.   LVAD education done on emergency procedures and precautions and reviewed exit site care.  Length of Stay: [place], MD 05/11/2019, 10:16 AM  VAD Team --- VAD ISSUES ONLY--- Pager [phone] (7am - 7am)  Advanced Heart Failure Team  Pager [phone] (M-F; 7a - 4p)  Please contact [facility] for night-coverage after hours (4p -7a ) and weekends on [email or web address]

## 2019-05-11 NOTE — Progress Notes (Signed)
Physical Therapy Treatment Patient Details Name: [name] MRN: [id number] DOB: 1965-12-21 Today's Date: 05/11/2019    History of Present Illness 53 yo male admitted for ischemic foot pain with need for amputation => transmetatarsal amputation right foot on 8/25. PMH - LVAD 10/18 with emergent pump exchange 6/19, ESRD on HD, CHF, CAD, DM, cardiomyopathy    PT Comments    Patient gradually increasing walking distance as walked with nursing earlier today and now able to walk to same spot, then rest and walk back to room.  Limited activity tolerance, but reports was limited previously.  Does have steps for home entry.  Reports son helps him as no railings.  He was able to verbalize correct sequence to descend steps with R leg first.  Will need further education and possibly training with son for safety home entry.  If patient doesn't already have w/c may benefit from one while healing his foot.  PT to follow.    Follow Up Recommendations  Home health PT;Supervision/Assistance - 24 hour     Equipment Recommendations  Wheelchair (measurements PT);Wheelchair cushion (measurements PT)    Recommendations for Other Services       Precautions / Restrictions Precautions Precautions: Fall;Other (comment) Required Braces or Orthoses: Other Brace Other Brace: Darco shoe--R Restrictions RLE Weight Bearing: Weight bearing as tolerated Other Position/Activity Restrictions:  pt to weight bear on rt heel     Mobility  Bed Mobility Overal bed mobility: Modified Independent                Transfers Overall transfer level: Needs assistance Equipment used: Rolling walker (2 wheeled) Transfers: Sit to/from Stand Sit to Stand: Min assist;+2 safety/equipment;From elevated surface Stand pivot transfers: Min assist       General transfer comment: poorly controlled descent to chair; increased assistance needed from lower levels  Ambulation/Gait Ambulation/Gait assistance: Min  assist;+2 safety/equipment Gait Distance (Feet): 60 Feet(x 2) Assistive device: Rolling walker (2 wheeled) Gait Pattern/deviations: Step-to pattern;Decreased stride length;Trunk flexed     General Gait Details: step to pattern appropriate for heel weight bearing in Darco shoe on R; fatiguing and heavy UE support, seated rest then walked back to room   Stairs             Wheelchair Mobility    Modified Rankin (Stroke Patients Only)       Balance Overall balance assessment: Needs assistance   Sitting balance-[name]: Good Sitting balance - Comments: Sitting at EOB and able to bend forward to pull up sock   Standing balance support: Bilateral upper extremity supported;During functional activity Standing balance-[name]: Poor Standing balance comment: Reliant on physical A and UE support.                            Cognition Arousal/Alertness: Awake/alert Behavior During Therapy: WFL for tasks assessed/performed Overall Cognitive Status: Within Functional Limits for tasks assessed                                 General Comments: slow processing most likely baseline      Exercises      General Comments General comments (skin integrity, edema, etc.): OT present and supervised pt to switch to battery to walk, needed cues for safe set up for mobility      Pertinent Vitals/Pain Pain Assessment: 0-10 Pain Score: 5  Faces Pain Scale: Hurts  little more Pain Location: Right foot Pain Descriptors / Indicators: Grimacing;Guarding;Aching Pain Intervention(s): Monitored during session;Repositioned    Home Living                      Prior Function            PT Goals (current goals can now be found in the care plan section) Acute Rehab PT Goals Patient Stated Goal: "I want to be able to walk independently again" Progress towards PT goals: Progressing toward goals    Frequency    Min 3X/week      PT Plan Discharge  plan needs to be updated    Co-evaluation PT/OT/SLP Co-Evaluation/Treatment: Yes Reason for Co-Treatment: For patient/therapist safety;To address functional/ADL transfers PT goals addressed during session: Mobility/safety with mobility;Proper use of DME OT goals addressed during session: ADL's and self-care      AM-PAC PT "6 Clicks" Mobility   Outcome Measure  Help needed turning from your back to your side while in a flat bed without using bedrails?: None Help needed moving from lying on your back to sitting on the side of a flat bed without using bedrails?: None Help needed moving to and from a bed to a chair (including a wheelchair)?: A Little Help needed standing up from a chair using your arms (e.g., wheelchair or bedside chair)?: A Little Help needed to walk in hospital room?: A Little Help needed climbing 3-5 steps with a railing? : A Little 6 Click Score: 20    End of Session Equipment Utilized During Treatment: Gait belt Activity Tolerance: Patient limited by fatigue Patient left: in chair;with call bell/phone within reach   PT Visit Diagnosis: Other abnormalities of gait and mobility (R26.89);Pain;Unsteadiness on feet (R26.81)     Time: 5364-6803 PT Time Calculation (min) (ACUTE ONLY): 34 min  Charges:  $Gait Training: 8-22 mins                     [name], [name] [phone] 05/11/2019    [name] 05/11/2019, 5:05 PM

## 2019-05-11 NOTE — Progress Notes (Signed)
ANTICOAGULATION CONSULT NOTE - Follow Up Consult  Pharmacy Consult for warfarin Indication: LVAD  Allergies  Allergen Reactions  . Metformin And Related Diarrhea    Patient Measurements: Height: 5' 7.01" (170.2 cm) Weight: 187 lb 9.8 oz (85.1 kg) IBW/kg (Calculated) : 66.12 Heparin Dosing Weight: 83.2 kg  Vital Signs: Temp: 98.1 F (36.7 C) (09/05 0800) Temp Source: Oral (09/05 0800) BP: 106/96 (09/05 0800) Pulse Rate: 82 (09/05 0330)  Labs: Recent Labs    05/09/19 0334 05/10/19 0500 05/11/19 0418  HGB 9.1* 8.7* 9.2*  HCT 30.5* 29.4* 30.7*  PLT 303 313 304  LABPROT 28.5* 30.8* 29.3*  INR 2.7* 3.0* 2.8*  CREATININE 6.57* 8.66*  --     Estimated Creatinine Clearance: 10.2 mL/min (A) (by C-G formula based on SCr of 8.66 mg/dL (H)).   Assessment: 63 yom w/ hx Heartmate 3 LVAD, factor V Leiden, and PAF on warfarin PTA admitted with ischemic foot now s/p transmetatarsal amputation on R foot 8/25 and stent 8/27.   INR therapeutic at 2.8  **Home regimen 5 mg daily except 7.5 mg on Tuesday/Thurs  Goal of Therapy:  INR: 2-3 Monitor platelets by anticoagulation protocol: Yes   Plan:  -Warfarin 5 mg x 1 today - resuming home dose -Daily INR  Vertis Kelch, PharmD PGY2 Cardiology Pharmacy Resident Phone [phone] 05/11/2019       1:40 PM  Please check [email or web address] for unit-specific pharmacist phone numbers

## 2019-05-11 NOTE — Progress Notes (Signed)
Some bleeding noted at suture site. Per notes Dr. Donzetta Matters, Vascular, will see pt on Tuesday if he is here otherwise O/P. Will continue to monitor

## 2019-05-11 NOTE — Progress Notes (Signed)
Occupational Therapy Evaluation Patient Details Name: [name] MRN: [id number] DOB: [birth year] Today's Date: 05/11/2019    History of Present Illness 53 yo male admitted for ischemic foot pain with need for amputation => transmetatarsal amputation right foot on 8/25. PMH - LVAD 10/18 with emergent pump exchange 6/19, ESRD on HD, CHF, CAD, DM, cardiomyopathy   Clinical Impression   Pt making steady progress. Pt had ambulated x 2 with nursing today prior to sesison. Assist to help with donning/doffing R sock and Darco shoe. Drainage from incision R foot after ambulating - nsg made aware. Able to switch power sources independently. Recommend pt use safety strap on battery clip.Pt verbalized understanding. Pt's son will be able to assist as needed at DC. Feel pt is appropriate to DC home with HHOT. Will continue to follow acutely.     Follow Up Recommendations  Home health OT;Supervision/Assistance - 24 hour    Equipment Recommendations  None recommended by OT    Recommendations for Other Services       Precautions / Restrictions Precautions Precautions: Fall;Other (comment)(LVAD) Required Braces or Orthoses: Other Brace(DARCO shoe) Restrictions RLE Weight Bearing: Weight bearing as tolerated Other Position/Activity Restrictions:  pt to weight bear on rt heel       Mobility Bed Mobility Overal bed mobility: Modified Independent                Transfers Overall transfer level: Needs assistance Equipment used: Rolling walker (2 wheeled) Transfers: Sit to/from Stand Sit to Stand: Min assist;+2 safety/equipment;From elevated surface Stand pivot transfers: Min assist       General transfer comment: poorly controlled descent to chair; increased assistance needed from lower levels    Balance Overall balance assessment: Needs assistance   Sitting balance-[name]: Fair       Standing balance-[name]: Poor Standing balance comment: Reliant on physical A  and UE support.                           ADL either performed or assessed with clinical judgement   ADL Overall ADL's : Needs assistance/impaired     Grooming: Set up;Sitting   Upper Body Bathing: Set up;Sitting   Lower Body Bathing: Minimal assistance;Sit to/from stand   Upper Body Dressing : Set up;Sitting Upper Body Dressing Details (indicate cue type and reason): able to donn LVAD vest Lower Body Dressing: Moderate assistance;Sit to/from stand Lower Body Dressing Details (indicate cue type and reason): Max A for donning shoes and darko shoe. Pt able to benf forward to pull up left sock              Functional mobility during ADLs: Minimal assistance;+2 for safety/equipment;Rolling walker;Cueing for sequencing(VC for hand placement) General ADL Comments: family assists with ADL     Vision         Perception     Praxis      Pertinent Vitals/Pain Pain Assessment: Faces Faces Pain Scale: Hurts little more Pain Location: Right foot Pain Descriptors / Indicators: Grimacing;Guarding;Aching Pain Intervention(s): Limited activity within patient's tolerance;Repositioned     Hand Dominance     Extremity/Trunk Assessment Upper Extremity Assessment Upper Extremity Assessment: Generalized weakness(LUE deficits form previous surgery)   Lower Extremity Assessment Lower Extremity Assessment: RLE deficits/detail RLE Deficits / Details: transmet amputation and limited by pain       Communication     Cognition Arousal/Alertness: Awake/alert Behavior During Therapy: WFL for tasks assessed/performed Overall Cognitive Status: Within  Functional Limits for tasks assessed                                 General Comments: slow processing most likely baseline   General Comments       Exercises     Shoulder Instructions      Home Living                                          Prior Functioning/Environment                    OT Problem List:        OT Treatment/Interventions:      OT Goals(Current goals can be found in the care plan section) Acute Rehab OT Goals Patient Stated Goal: "I want to be able to walk independently again" OT Goal Formulation: With patient Time For Goal Achievement: 05/18/19 Potential to Achieve Goals: Good ADL Goals Pt Will Perform Lower Body Dressing: with modified independence;sit to/from stand Pt Will Transfer to Toilet: with modified independence;ambulating;bedside commode Pt Will Perform Toileting - Clothing Manipulation and hygiene: with modified independence;sitting/lateral leans;sit to/from stand  OT Frequency: Min 2X/week   Barriers to D/C:            Co-evaluation PT/OT/SLP Co-Evaluation/Treatment: Yes Reason for Co-Treatment: Complexity of the patient's impairments (multi-system involvement)   OT goals addressed during session: ADL's and self-care      AM-PAC OT "6 Clicks" Daily Activity     Outcome Measure Help from another person eating meals?: None Help from another person taking care of personal grooming?: A Little Help from another person toileting, which includes using toliet, bedpan, or urinal?: A Little Help from another person bathing (including washing, rinsing, drying)?: A Little Help from another person to put on and taking off regular upper body clothing?: A Little Help from another person to put on and taking off regular lower body clothing?: A Lot 6 Click Score: 18   End of Session Equipment Utilized During Treatment: Gait belt;Rolling walker;Other (comment)(LVAD) Nurse Communication: Mobility status;Other (comment)(encourage OOB for meals; draininge R foot)  Activity Tolerance: Patient tolerated treatment well Patient left: in bed;with call bell/phone within reach  OT Visit Diagnosis: Unsteadiness on feet (R26.81);Other abnormalities of gait and mobility (R26.89);Muscle weakness (generalized) (M62.81);Pain Pain - Right/Left:  Right Pain - part of body: Ankle and joints of foot                Time: 7510-2585 OT Time Calculation (min): 45 min Charges:  OT General Charges $OT Visit: 1 Visit OT Treatments $Self Care/Home Management : 23-37 mins  Maurie Boettcher, OT/L   Acute OT Clinical Specialist [place] Pager [phone] Office [phone]   [facility] 05/11/2019, 3:52 PM

## 2019-05-11 NOTE — Progress Notes (Signed)
Admit: 04/26/2019 LOS: 21  28M ESRD w/ LVAD and ischemic R foot s/p TMA and LLE angiogram + stent  Subjective:   . Seen on HD today, no c/o  09/04 0701 - 09/05 0700 In: -  Out: 2693   Filed Weights   05/10/19 0542 05/10/19 0659 05/10/19 1110  Weight: 87.7 kg 87.7 kg 85.1 kg    Scheduled Meds: . aspirin EC  81 mg Oral Daily  . atorvastatin  40 mg Oral q1800  . calcitRIOL  0.5 mcg Oral Q M,W,F  . Chlorhexidine Gluconate Cloth  6 each Topical Q0600  . Chlorhexidine Gluconate Cloth  6 each Topical Q0600  . citalopram  20 mg Oral Daily  . darbepoetin (ARANESP) injection - DIALYSIS  150 mcg Intravenous Q Mon-HD  . erythromycin  250 mg Oral TID  . gabapentin  300 mg Oral BID  . hydrALAZINE  25 mg Oral 3 times per day on Sun Tue Thu Sat  . insulin aspart  0-9 Units Subcutaneous TID WC  . insulin aspart  7 Units Subcutaneous Q supper  . insulin glargine  10 Units Subcutaneous QHS  . LORazepam  0.5 mg Oral BID  . mouth rinse  15 mL Mouth Rinse BID  . metoCLOPramide  10 mg Oral TID WC  . midodrine  5 mg Oral Q M,W,F  . multivitamin  1 tablet Oral QHS  . sodium chloride flush  3 mL Intravenous Q12H  . traMADol  50 mg Oral Q6H  . Warfarin - Pharmacist Dosing Inpatient   Does not apply q1800   Continuous Infusions: . sodium chloride 10 mL/hr at 05/02/19 0636  . sodium chloride     PRN Meds:.sodium chloride, acetaminophen, bisacodyl, docusate sodium, hydrALAZINE, labetalol, ondansetron (ZOFRAN) IV, oxyCODONE, promethazine, promethazine, sodium chloride flush  Current Labs: reviewed   Physical Exam:  Blood pressure (!) 106/96, pulse 82, temperature 98.1 F (36.7 C), temperature source Oral, resp. rate 15, height 5' 7.01" (1.702 m), weight 85.1 kg, SpO2 98 %. NAD COnt hum from LVAD RRR NO LEE R TMA bandaged AAO x3 TDC L IJ  Dialysis:  MWF [facility]  4h 71mn  400/800   84kg  2K/3Ca  TDC  Hep none - Hectoral 258m IV q HD - Mircera 20052mq 2 weeks (last 8/18) - Venofer 71m30m  weekly  Assessment/ Plan 1.  R foot ischemia/dry gangrene + ischemic L foot: s/p R TMA 8/25 and angio with stent of LLE 8/27.    2. ESRD: HD MWF. Next HD Monday.  Midodrine pre HD and midHD keep MAP > 60-65.  3. Hypertension/volume: BP controlled. Vol excess resolved, close to dry wt 4.  Anemia:  Hgb down, resume Aranesp 150 with HD 8/31 5.  Metabolic bone disease: Ca OK, on C3 6.  CAD/ICM/LVAD: Per HF team 7.  Type 2 DM with known severe gastroparesis 8.  Dispo - prob going home Monday after HD. Will see again on Monday, call over w/e if needed.   [name] 05/11/2019, 1:12 PM    Recent Labs  Lab 05/08/19 0402 05/09/19 0334 05/10/19 0500  NA 133* 136 134*  K 4.3 4.1 4.5  CL 96* 99 98  CO2 22 23 23   GLUCOSE 108* 131* 159*  BUN 57* 34* 49*  CREATININE 9.31* 6.57* 8.66*  CALCIUM 9.4 8.8* 9.2   Recent Labs  Lab 05/09/19 0334 05/10/19 0500 05/11/19 0418  WBC 16.9* 16.0* 14.4*  HGB 9.1* 8.7* 9.2*  HCT 30.5* 29.4* 30.7*  MCV 93.3 95.1 93.9  PLT 303 313 304

## 2019-05-12 LAB — GLUCOSE, CAPILLARY
Glucose-Capillary: 92 mg/dL (ref 70–99)
Glucose-Capillary: 180 mg/dL — ABNORMAL HIGH (ref 70–99)
Glucose-Capillary: 172 mg/dL — ABNORMAL HIGH (ref 70–99)
Glucose-Capillary: 136 mg/dL — ABNORMAL HIGH (ref 70–99)

## 2019-05-12 LAB — PROTIME-INR
INR: 2.4 — ABNORMAL HIGH (ref 0.8–1.2)
Prothrombin Time: 26 seconds — ABNORMAL HIGH (ref 11.4–15.2)

## 2019-05-12 LAB — LACTATE DEHYDROGENASE: LDH: 171 U/L (ref 98–192)

## 2019-05-12 MED ORDER — WARFARIN SODIUM 7.5 MG PO TABS
7.5000 mg | ORAL_TABLET | Freq: Once | ORAL | Status: AC
  Administered 2019-05-12: 7.5 mg via ORAL
  Filled 2019-05-12: qty 1

## 2019-05-12 NOTE — Progress Notes (Signed)
ANTICOAGULATION CONSULT NOTE - Follow Up Consult  Pharmacy Consult for warfarin Indication: LVAD  Allergies  Allergen Reactions  . Metformin And Related Diarrhea    Patient Measurements: Height: 5' 7.01" (170.2 cm) Weight: 199 lb 1.2 oz (90.3 kg) IBW/kg (Calculated) : 66.12 Heparin Dosing Weight: 83.2 kg  Vital Signs: Temp: 97.7 F (36.5 C) (09/06 1200) Temp Source: Oral (09/06 1200) BP: 117/92 (09/06 1200) Pulse Rate: 81 (09/06 1200)  Labs: Recent Labs    05/10/19 0500 05/11/19 0418 05/12/19 0349  HGB 8.7* 9.2*  --   HCT 29.4* 30.7*  --   PLT 313 304  --   LABPROT 30.8* 29.3* 26.0*  INR 3.0* 2.8* 2.4*  CREATININE 8.66*  --   --     Estimated Creatinine Clearance: 10.5 mL/min (A) (by C-G formula based on SCr of 8.66 mg/dL (H)).   Assessment: 29 yom w/ hx Heartmate 3 LVAD, factor V Leiden, and PAF on warfarin PTA admitted with ischemic foot now s/p transmetatarsal amputation on R foot 8/25 and stent 8/27.   INR today slightly low at 2.4 possibly due to dose reduction when oral intake was low  **Home regimen 5 mg daily except 7.5 mg on Tuesday/Thurs  Goal of Therapy:  INR: 2-3 Monitor platelets by anticoagulation protocol: Yes   Plan:  -Warfarin 7.5 mg x 1 today -Daily INR  Vertis Kelch, PharmD PGY2 Cardiology Pharmacy Resident Phone [phone] 05/12/2019       12:11 PM  Please check [email or web address] for unit-specific pharmacist phone numbers

## 2019-05-12 NOTE — Progress Notes (Signed)
Patient ID: [name], male   DOB: 01/11/1966, 53 y.o.   MRN: [id number]   Advanced Heart Failure VAD Team Note  PCP-Cardiologist: No primary care provider on file.   Subjective:    Admitted for ischemic foot pain with need for amputation => transmetatarsal amputation right foot on 8/25.   Left leg angiogram 8/27: DES to left SFA.  1 vessel runoff via peroneal, severe disease with multilevel stenosis but unable to intervene.   Feels ok today, Walked 2x yesterday with RN and PT.  Feels he is getting a bit stronger. No CP or SOB. Foot feels ok.   LVAD INTERROGATION:  HeartMate 3 LVAD:   Flow 4.1 L  liters/min, speed 5500, power 4.0, PI 4.0 VAD interrogated personally. Parameters stable.   Objective:    Vital Signs:   Temp:  [97.9 F (36.6 C)-99.1 F (37.3 C)] 98 F (36.7 C) (09/06 0810) Pulse Rate:  [83-93] 83 (09/06 0810) Resp:  [16-22] 17 (09/06 0810) BP: (96-109)/(83-87) 96/83 (09/06 0810) SpO2:  [96 %-98 %] 97 % (09/06 0810) Weight:  [90.3 kg] 90.3 kg (09/06 0500) Last BM Date: 05/11/19 Mean arterial Pressure 80-90s  Intake/Output:   Intake/Output Summary (Last 24 hours) at 05/12/2019 0943 Last data filed at 05/12/2019 0900 Gross per 24 hour  Intake 480 ml  Output -  Net 480 ml     Physical Exam   General:  NAD.  HEENT: normal  Neck: supple. RIJ TLC.  Carotids 2+ bilat; no bruits. No lymphadenopathy or thryomegaly appreciated. Cor: LVAD hum.  Left chest perm cath Lungs: Clear. Abdomen: soft, nontender, non-distended. No hepatosplenomegaly. No bruits or masses. Good bowel sounds. Driveline site clean. Anchor in place.  Extremities: no cyanosis, clubbing, rash. Warm no edema   R transmet amp Neuro: alert & oriented x 3. No focal deficits. Moves all 4 without problem   Telemetry   Sinus 80-90 Personally reviewed   Labs   Basic Metabolic Panel: Recent Labs  Lab 05/06/19 0415 05/07/19 0423 05/08/19 0402 05/09/19 0334 05/10/19 0500  NA 130* 132*  133* 136 134*  K 4.1 4.0 4.3 4.1 4.5  CL 92* 96* 96* 99 98  CO2 22 23 22 23 23   GLUCOSE 112* 131* 108* 131* 159*  BUN 60* 43* 57* 34* 49*  CREATININE 10.09* 7.70* 9.31* 6.57* 8.66*  CALCIUM 8.7* 9.2 9.4 8.8* 9.2    Liver Function Tests: No results for input(s): AST, ALT, ALKPHOS, BILITOT, PROT, ALBUMIN in the last 168 hours. No results for input(s): LIPASE, AMYLASE in the last 168 hours. No results for input(s): AMMONIA in the last 168 hours.  CBC: Recent Labs  Lab 05/07/19 1005 05/08/19 0402 05/09/19 0334 05/10/19 0500 05/11/19 0418  WBC 18.9* 16.3* 16.9* 16.0* 14.4*  HGB 9.2* 9.1* 9.1* 8.7* 9.2*  HCT 29.5* 30.1* 30.5* 29.4* 30.7*  MCV 91.9 92.3 93.3 95.1 93.9  PLT 280 307 303 313 304    INR: Recent Labs  Lab 05/08/19 0402 05/09/19 0334 05/10/19 0500 05/11/19 0418 05/12/19 0349  INR 2.3* 2.7* 3.0* 2.8* 2.4*    Other results:    Imaging   No results found.   Medications:     Scheduled Medications: . aspirin EC  81 mg Oral Daily  . atorvastatin  40 mg Oral q1800  . calcitRIOL  0.5 mcg Oral Q M,W,F  . Chlorhexidine Gluconate Cloth  6 each Topical Q0600  . Chlorhexidine Gluconate Cloth  6 each Topical Q0600  . citalopram  20 mg  Oral Daily  . darbepoetin (ARANESP) injection - DIALYSIS  150 mcg Intravenous Q Mon-HD  . erythromycin  250 mg Oral TID  . gabapentin  300 mg Oral BID  . hydrALAZINE  25 mg Oral 3 times per day on Sun Tue Thu Sat  . insulin aspart  0-9 Units Subcutaneous TID WC  . insulin aspart  7 Units Subcutaneous Q supper  . insulin glargine  10 Units Subcutaneous QHS  . LORazepam  0.5 mg Oral BID  . mouth rinse  15 mL Mouth Rinse BID  . metoCLOPramide  10 mg Oral TID WC  . midodrine  5 mg Oral Q M,W,F  . multivitamin  1 tablet Oral QHS  . sodium chloride flush  3 mL Intravenous Q12H  . traMADol  50 mg Oral Q6H  . Warfarin - Pharmacist Dosing Inpatient   Does not apply q1800    Infusions: . sodium chloride 10 mL/hr at 05/02/19 0636   . sodium chloride      PRN Medications: sodium chloride, acetaminophen, bisacodyl, docusate sodium, hydrALAZINE, labetalol, ondansetron (ZOFRAN) IV, oxyCODONE, promethazine, promethazine, sodium chloride flush  Assessment/Plan:    1. PAD/ischemic feet: S/p R transmetatarsal amputation on 8/25.  WBCs up to 27 post-op but afebrile, may be reactive.  WBCs now trending down slowly, 16 today.  He completed vancomycin for ?osteomyelitis on 8/19 (was getting with HD). Cultures NGTD. Left great toe dusky, now s/p left leg angiogram with SFA PCI.  There was 1 vessel runoff via left peroneal with severe multilevel stenosis => unable to intervene on peroneal.  - Currently off abx.  - Dr. Donzetta Matters following.  - Seems to be healing well with good tissue integrity so far 2. Chronic systolic CHF: Ischemic cardiomyopathy. St Jude ICD. NSTEMI in March 2018 with DES to LAD and RCA, complicated by cardiogenic shock, low output requiring milrinone. Echo 8/18 with EF 20-25%, moderate MR. CPX (8/18) with severe functional impairment due to HF. He is s/p HM3 LVAD placement. He had pump thrombosis 6/19 due to [facility] outflow graft kinking. He had pump exchange for another HM3.  - INR2.4 today. Discussed dosing with PharmD personally. - Volume managed at dialysis, MWF. Had HD yesterday without incident  - Continue ASA 81and warfarin with INR goal 2-2.5.  INR 2.4 today. LDH 171 - He has been referred to [facility] for evaluation for heart/kidney transplantand recently saw Dr Mosetta Pigeon =>he is not candidate for transplantnowgiven extensive PAD.  - Continue hydralazine 25 mg tid on non-HD days, MAP in 80s-90s.  3. ESRD: HD on M-W-F with midodrine pre-HD.  4.  CAD: NSTEMI in 3/18. LHC with 99% ulcerated lesion proximal RCA with left to right collaterals, 95% mid LAD stenosis after mid LAD stent. s/p PCI to RCA and LAD on 11/25/16. No s/s ischemia - Continue statin and ASA. 5. h/o DVTs: Factor V Leiden heterozygote.  -Anticoagulated. 6. Diabetic gastroparesis: He has seen gastroparesis specialist at [facility]. Surprisingly, gastric emptying study was normal. However, electrogastrogram showed poor gastric accomodation/capacity. Therefore, small frequent meals recommended.Nausea earlier this admission, now resolved.  - Continue reglan 10 mg tid/ac and Marinol.  - Continue Protonix bid.  - He will continue erythromycinindefinitely.  - Limit narcotic use as much as possible.  - Doing well today. Tolerating po  6. DM:SSI and home insulin regimen.  7. PFX:[id number] hydralazine on non-HD days.  8. Deconditioning: Continue PT/OT, needs these daily.  We have strongly encouraged him to work on getting up. PT recommend CIR, but  his insurance will not cover this.  He is doing better per PT, may be able to go home with home PT.  Plan home tomorrow after HD.   I reviewed the LVAD parameters from today, and compared the results to the patient's prior recorded data.  No programming changes were made.  The LVAD is functioning within specified parameters.  The patient performs LVAD self-test daily.  LVAD interrogation was negative for any significant power changes, alarms or PI events/speed drops.  LVAD equipment check completed and is in good working order.  Back-up equipment present.   LVAD education done on emergency procedures and precautions and reviewed exit site care.  Length of Stay: [place], MD 05/12/2019, 9:43 AM  VAD Team --- VAD ISSUES ONLY--- Pager [phone] (7am - 7am)  Advanced Heart Failure Team  Pager [phone] (M-F; 7a - 4p)  Please contact [facility] for night-coverage after hours (4p -7a ) and weekends on [email or web address]

## 2019-05-12 NOTE — Progress Notes (Signed)
Physical Therapy Treatment Patient Details Name: [name] MRN: [id number] DOB: 11-May-1966 Today's Date: 05/12/2019    History of Present Illness 53 yo male admitted for ischemic foot pain with need for amputation => transmetatarsal amputation right foot on 8/25. PMH - LVAD 10/18 with emergent pump exchange 6/19, ESRD on HD, CHF, CAD, DM, cardiomyopathy    PT Comments    Pt pleasant and willing to mobilize with use of RW and attempt stairs. Pt transferred from bed to recliner with posterior LOB during transfer with mod assist and cues to recover. Pt unable to complete 2 steps to enter home despite 3 different attempts and use of rW. Pt educated for need for ramp at home to safely egress. Pt with decreased problem solving and safety awareness and will need 24hr assist at D/C.  BP 96/83 (90) HR 90 Speed 5500, 4.2 flow, 3.6 PI, 4.4 power    Follow Up Recommendations  Home health PT;Supervision/Assistance - 24 hour     Equipment Recommendations  Wheelchair (measurements PT);Wheelchair cushion (measurements PT);Other (comment)(ramp)    Recommendations for Other Services       Precautions / Restrictions Precautions Precautions: Fall;Other (comment) Precaution Comments: LVAD Required Braces or Orthoses: Other Brace Other Brace: Darco shoe--R Restrictions RLE Weight Bearing: Weight bearing as tolerated Other Position/Activity Restrictions:  pt to weight bear on rt heel     Mobility  Bed Mobility Overal bed mobility: Modified Independent Bed Mobility: Supine to Sit           General bed mobility comments: bed flat, no rail, increased time  Transfers Overall transfer level: Needs assistance   Transfers: Sit to/from Stand Sit to Stand: Min guard         General transfer comment: cues for hand placement,  positioning at chair and safety to stand from bed/chair and to chair x 4 total trials  Ambulation/Gait Ambulation/Gait assistance: Min assist;+2  safety/equipment Gait Distance (Feet): 75 Feet Assistive device: Rolling walker (2 wheeled) Gait Pattern/deviations: Step-to pattern;Decreased stride length;Trunk flexed   Gait velocity interpretation: 1.31 - 2.62 ft/sec, indicative of limited community ambulator General Gait Details: step to pattern appropriate for heel weight bearing in Darco shoe on R; fatiguing and heavy UE support, 10' x 2 trials at stairs with seated rest then 21' with close chair follow   Stairs Stairs: Yes Stairs assistance: Mod assist;+2 physical assistance;+2 safety/equipment Stair Management: With walker Number of Stairs: 2 General stair comments: pt stating he steps forward up steps at home without rails leaning back on son who effectively accepts all of pt's weight to off load feet to step up. Attempted stairs as pt described but unable to complete 1 step. Educated pt for RW backward on the stairs with assist. Pt able to ascend 1st step appropriately. However when trying to step up 2nd step. Pt moving opposite of cues for sequence and trying to step forward rather than back with 2 person mod assist to control balance. 2nd attempt pt able to again perform 1 step but could not ascend 2nd. Pt with anxiety and fear with steps with this sequence. Educated for need for railing and ramp at home.   Wheelchair Mobility    Modified Rankin (Stroke Patients Only)       Balance Overall balance assessment: Needs assistance Sitting-balance support: No upper extremity supported;Feet supported Sitting balance-[name]: Good Sitting balance - Comments: Sitting at EOB and able to bend forward to pull up sock   Standing balance support: Bilateral upper extremity  supported;During functional activity Standing balance-[name]: Poor Standing balance comment: Reliant on physical A and UE support. Pt with posterior LOB x 3 during session with mod assist to recover                            Cognition  Arousal/Alertness: Awake/alert Behavior During Therapy: WFL for tasks assessed/performed Overall Cognitive Status: Impaired/Different from baseline Area of Impairment: Following commands;Safety/judgement                       Following Commands: Follows one step commands consistently Safety/Judgement: Decreased awareness of safety;Decreased awareness of deficits     General Comments: pt with decreased processing, unable to follow commands for sequencing stairs, posterior LOB x 3 during session with pt without concern      Exercises      General Comments        Pertinent Vitals/Pain Pain Assessment: No/denies pain    Home Living                      Prior Function            PT Goals (current goals can now be found in the care plan section) Progress towards PT goals: Progressing toward goals    Frequency           PT Plan Current plan remains appropriate    Co-evaluation              AM-PAC PT "6 Clicks" Mobility   Outcome Measure  Help needed turning from your back to your side while in a flat bed without using bedrails?: None Help needed moving from lying on your back to sitting on the side of a flat bed without using bedrails?: None Help needed moving to and from a bed to a chair (including a wheelchair)?: A Little Help needed standing up from a chair using your arms (e.g., wheelchair or bedside chair)?: A Little Help needed to walk in hospital room?: A Little Help needed climbing 3-5 steps with a railing? : Total 6 Click Score: 18    End of Session Equipment Utilized During Treatment: Gait belt Activity Tolerance: Patient tolerated treatment well Patient left: in chair;with call bell/phone within reach Nurse Communication: Mobility status PT Visit Diagnosis: Other abnormalities of gait and mobility (R26.89);Pain;Unsteadiness on feet (R26.81)     Time: 1914-7829 PT Time Calculation (min) (ACUTE ONLY): 35 min  Charges:  $Gait  Training: 23-37 mins                     [place], PT Acute Rehabilitation Services Pager: [phone] Office: [place] 05/12/2019, 1:14 PM

## 2019-05-13 LAB — CBC
HCT: 29.7 % — ABNORMAL LOW (ref 39.0–52.0)
Hemoglobin: 8.9 g/dL — ABNORMAL LOW (ref 13.0–17.0)
MCH: 27.8 pg (ref 26.0–34.0)
MCHC: 30 g/dL (ref 30.0–36.0)
MCV: 92.8 fL (ref 80.0–100.0)
Platelets: 344 10*3/uL (ref 150–400)
RBC: 3.2 MIL/uL — ABNORMAL LOW (ref 4.22–5.81)
RDW: 17.1 % — ABNORMAL HIGH (ref 11.5–15.5)
WBC: 15.9 10*3/uL — ABNORMAL HIGH (ref 4.0–10.5)
nRBC: 0 % (ref 0.0–0.2)

## 2019-05-13 LAB — RENAL FUNCTION PANEL
Albumin: 2.6 g/dL — ABNORMAL LOW (ref 3.5–5.0)
Anion gap: 15 (ref 5–15)
BUN: 67 mg/dL — ABNORMAL HIGH (ref 6–20)
CO2: 22 mmol/L (ref 22–32)
Calcium: 8.9 mg/dL (ref 8.9–10.3)
Chloride: 96 mmol/L — ABNORMAL LOW (ref 98–111)
Creatinine, Ser: 10.75 mg/dL — ABNORMAL HIGH (ref 0.61–1.24)
GFR calc Af Amer: 6 mL/min — ABNORMAL LOW (ref >60)
GFR calc non Af Amer: 5 mL/min — ABNORMAL LOW (ref >60)
Glucose, Bld: 104 mg/dL — ABNORMAL HIGH (ref 70–99)
Phosphorus: 7.3 mg/dL — ABNORMAL HIGH (ref 2.5–4.6)
Potassium: 5.1 mmol/L (ref 3.5–5.1)
Sodium: 133 mmol/L — ABNORMAL LOW (ref 135–145)

## 2019-05-13 LAB — PROTIME-INR
INR: 2.7 — ABNORMAL HIGH (ref 0.8–1.2)
Prothrombin Time: 28.1 seconds — ABNORMAL HIGH (ref 11.4–15.2)

## 2019-05-13 LAB — GLUCOSE, CAPILLARY
Glucose-Capillary: 93 mg/dL (ref 70–99)
Glucose-Capillary: 134 mg/dL — ABNORMAL HIGH (ref 70–99)

## 2019-05-13 LAB — LACTATE DEHYDROGENASE: LDH: 168 U/L (ref 98–192)

## 2019-05-13 MED ORDER — DARBEPOETIN ALFA 150 MCG/0.3ML IJ SOSY
PREFILLED_SYRINGE | INTRAMUSCULAR | Status: AC
  Filled 2019-05-13: qty 0.3

## 2019-05-13 MED ORDER — CALCITRIOL 0.5 MCG PO CAPS
ORAL_CAPSULE | ORAL | Status: AC
  Filled 2019-05-13: qty 1

## 2019-05-13 MED ORDER — HEPARIN SODIUM (PORCINE) 1000 UNIT/ML IJ SOLN
INTRAMUSCULAR | Status: AC
  Filled 2019-05-13: qty 4

## 2019-05-13 MED ORDER — MIDODRINE HCL 5 MG PO TABS
ORAL_TABLET | ORAL | Status: AC
  Filled 2019-05-13: qty 1

## 2019-05-13 MED ORDER — WARFARIN SODIUM 5 MG PO TABS
5.0000 mg | ORAL_TABLET | Freq: Once | ORAL | Status: DC
  Filled 2019-05-13: qty 1

## 2019-05-13 NOTE — TOC Transition Note (Addendum)
Transition of Care [facility]) - CM/SW Discharge Note   Patient Details  Name: [name] MRN: [id number] Date of Birth: Mar 03, 1966  Transition of Care [facility]) CM/SW Contact:  Pollie Friar, RN Phone Number: 05/13/2019, 1:31 PM   Clinical Narrative:    Pt discharging home with [facility] services through [facility]. Tiffany with [facility] accepted the referral.  Pt has support at home and transportation to home.  1410: Addendum: Wheelchair added for home DME. Unable to obtain today. CM provide him orders and place with address of where to get chair in am.   Final next level of care: Home w Home Health Services Barriers to Discharge: No Barriers Identified   Patient Goals and CMS Choice Patient states their goals for this hospitalization and ongoing recovery are:: recover CMS Medicare.gov Compare Post Acute Care list provided to:: Patient Choice offered to / list presented to : Patient  Discharge Placement                       Discharge Plan and Services In-house Referral: NA Discharge Planning Services: CM Consult Post Acute Care Choice: NA          DME Arranged: (NA)         HH Arranged: PT, OT HH Agency: [facility] (formerly [employer]) Date [place]: 05/13/19   Representative spoke with at [place]: [place] Interventions     Readmission Risk Interventions Readmission Risk Prevention Plan 03/20/2019  Transportation Screening Complete  Medication Review Press photographer) Complete  HRI or [place] Complete  SW Recovery Care/Counseling Consult Complete  [name] Not Applicable  Some recent data might be hidden

## 2019-05-13 NOTE — Progress Notes (Signed)
Occupational Therapy Treatment Patient Details Name: [name] MRN: [id number] DOB: 01/07/1966 Today's Date: 05/13/2019    History of present illness 53 yo male admitted for ischemic foot pain with need for amputation => transmetatarsal amputation right foot on 8/25. PMH - LVAD 10/18 with emergent pump exchange 6/19, ESRD on HD, CHF, CAD, DM, cardiomyopathy   OT comments  Pt preparing for discharge home with son's assist.  Reviewed energy conservation, donning/doffing Darco show, and safety with ADLs with both.   Follow Up Recommendations  Home health OT;Supervision/Assistance - 24 hour    Equipment Recommendations  None recommended by OT    Recommendations for Other Services      Precautions / Restrictions Precautions Precautions: Fall;Other (comment) Precaution Comments: LVAD Required Braces or Orthoses: Other Brace Other Brace: Darco shoe--R Restrictions Weight Bearing Restrictions: Yes RLE Weight Bearing: Weight bearing as tolerated Other Position/Activity Restrictions:  pt to weight bear on rt heel        Mobility Bed Mobility Overal bed mobility: Modified Independent Bed Mobility: Supine to Sit     Supine to sit: Supervision     General bed mobility comments: Pt sitting EOB   Transfers                      Balance Overall balance assessment: Needs assistance Sitting-balance support: No upper extremity supported;Feet supported Sitting balance-[name]: Good                                     ADL either performed or assessed with clinical judgement   ADL Overall ADL's : Needs assistance/impaired                     Lower Body Dressing: Moderate assistance;Sit to/from stand                 General ADL Comments: son present, assisting pt with dressing in prep for home discharge.  Reviewed how to don socks safely to prevent pressure as well as how to don Darco shoe with son.  Reviewed WBing precautions with  son, and reiterated energy conservation techniques with both      Vision       Perception     Praxis      Cognition Arousal/Alertness: Awake/alert Behavior During Therapy: Flat affect Overall Cognitive Status: Impaired/Different from baseline Area of Impairment: Following commands;Safety/judgement;Problem solving                       Following Commands: Follows one step commands consistently;Follows multi-step commands inconsistently Safety/Judgement: Decreased awareness of safety;Decreased awareness of deficits   Problem Solving: Slow processing;Decreased initiation          Exercises     Shoulder Instructions       General Comments measured for wheelchair, gave pt handouts Energy conservation, up and down steps in wheelchair, up and down steps with walker backwards, and how to build a ramp and educated pt to each.     Pertinent Vitals/ Pain       Pain Assessment: No/denies pain  Home Living                                          Prior Functioning/Environment  Frequency  Min 2X/week        Progress Toward Goals  OT Goals(current goals can now be found in the care plan section)  Progress towards OT goals: Progressing toward goals(date for goals extended )  Acute Rehab OT Goals Patient Stated Goal: to go home  OT Goal Formulation: With patient Time For Goal Achievement: 05/20/19 Potential to Achieve Goals: Good  Plan Frequency remains appropriate;Discharge plan needs to be updated    Co-evaluation                 AM-PAC OT "6 Clicks" Daily Activity     Outcome Measure   Help from another person eating meals?: None Help from another person taking care of personal grooming?: A Little Help from another person toileting, which includes using toliet, bedpan, or urinal?: A Little Help from another person bathing (including washing, rinsing, drying)?: A Little Help from another person to put on and  taking off regular upper body clothing?: A Little Help from another person to put on and taking off regular lower body clothing?: A Lot 6 Click Score: 18    End of Session    OT Visit Diagnosis: Unsteadiness on feet (R26.81);Other abnormalities of gait and mobility (R26.89);Muscle weakness (generalized) (M62.81);Pain Pain - Right/Left: Right Pain - part of body: Ankle and joints of foot   Activity Tolerance Patient tolerated treatment well   Patient Left in bed;with call bell/phone within reach;with family/visitor present   Nurse Communication Mobility status        Time: 1435-1445 OT Time Calculation (min): 10 min  Charges: OT General Charges $OT Visit: 1 Visit OT Treatments $Self Care/Home Management : 8-22 mins  Lucille Passy, OTR/L [place] Pager [phone] Office [phone]    Lucille Passy M 05/13/2019, 2:59 PM

## 2019-05-13 NOTE — Plan of Care (Signed)
  Problem: Clinical Measurements: Goal: Ability to maintain clinical measurements within normal limits will improve Outcome: Progressing   Problem: Clinical Measurements: Goal: Will remain free from infection Outcome: Progressing   Problem: Clinical Measurements: Goal: Cardiovascular complication will be avoided Outcome: Progressing   Problem: Activity: Goal: Risk for activity intolerance will decrease Outcome: Progressing   Problem: Nutrition: Goal: Adequate nutrition will be maintained Outcome: Progressing   Problem: Coping: Goal: Level of anxiety will decrease Outcome: Progressing   Problem: Safety: Goal: Ability to remain free from injury will improve Outcome: Progressing

## 2019-05-13 NOTE — Progress Notes (Signed)
Patient ID: [name], male   DOB: [birth year], 53 y.o.   MRN: [id number]  [facility] Progress Note   Assessment/ Plan:   1.  Status post right transmetatarsal amputation for severe PAD/ischemic leg.  Awaiting discharge home later today with outpatient physical therapy. 2. ESRD: Continue hemodialysis on Monday/Wednesday/Friday schedule, attempting to lower back to estimated dry weight.  Continue midodrine. 3. Anemia: Uptrending hemoglobin/hematocrit, continue ESA.  Anticipate improvement status post right TMA. 4. CKD-MBD: Continue phosphorus binder with vitamin D receptor analog upon discharge. 5. Nutrition: Continue renal diet with dietary phosphorus and sodium restriction. 6.  Chronic combined congestive heart failure secondary to ischemic cardiomyopathy: On LVAD.  Subjective:   Reports to be feeling fair with tolerable pain over amputation site.   Objective:   BP (!) (P) 121/94   Pulse (P) 66   Temp 98.2 F (36.8 C) (Oral)   Resp (P) 15   Ht 5' 7.01" (1.702 m)   Wt 90.9 kg   SpO2 (P) 96%   BMI 31.38 kg/m   Physical Exam: Gen: Comfortably resting on hemodialysis CVS: Pulse regular rhythm, precordial hum Resp: Anteriorly clear to auscultation, no rales/rhonchi, left IJ TDC Abd: Soft, flat, nontender Ext: Status post right TMA.  Left foot hallux dusky.  No pretibial edema.  Labs: BMET Recent Labs  Lab 05/07/19 0423 05/08/19 0402 05/09/19 0334 05/10/19 0500  NA 132* 133* 136 134*  K 4.0 4.3 4.1 4.5  CL 96* 96* 99 98  CO2 23 22 23 23   GLUCOSE 131* 108* 131* 159*  BUN 43* 57* 34* 49*  CREATININE 7.70* 9.31* 6.57* 8.66*  CALCIUM 9.2 9.4 8.8* 9.2   CBC Recent Labs  Lab 05/08/19 0402 05/09/19 0334 05/10/19 0500 05/11/19 0418  WBC 16.3* 16.9* 16.0* 14.4*  HGB 9.1* 9.1* 8.7* 9.2*  HCT 30.1* 30.5* 29.4* 30.7*  MCV 92.3 93.3 95.1 93.9  PLT 307 303 313 304   Medications:    . aspirin EC  81 mg Oral Daily  . atorvastatin  40 mg Oral q1800   . calcitRIOL  0.5 mcg Oral Q M,W,F  . Chlorhexidine Gluconate Cloth  6 each Topical Q0600  . Chlorhexidine Gluconate Cloth  6 each Topical Q0600  . citalopram  20 mg Oral Daily  . darbepoetin (ARANESP) injection - DIALYSIS  150 mcg Intravenous Q Mon-HD  . erythromycin  250 mg Oral TID  . gabapentin  300 mg Oral BID  . hydrALAZINE  25 mg Oral 3 times per day on Sun Tue Thu Sat  . insulin aspart  0-9 Units Subcutaneous TID WC  . insulin aspart  7 Units Subcutaneous Q supper  . insulin glargine  10 Units Subcutaneous QHS  . LORazepam  0.5 mg Oral BID  . mouth rinse  15 mL Mouth Rinse BID  . metoCLOPramide  10 mg Oral TID WC  . midodrine  5 mg Oral Q M,W,F  . multivitamin  1 tablet Oral QHS  . sodium chloride flush  3 mL Intravenous Q12H  . traMADol  50 mg Oral Q6H  . Warfarin - Pharmacist Dosing Inpatient   Does not apply q1800   Elmarie Shiley, MD 05/13/2019, 9:00 AM

## 2019-05-13 NOTE — Progress Notes (Signed)
ANTICOAGULATION CONSULT NOTE - Follow Up Consult  Pharmacy Consult for warfarin Indication: LVAD  Allergies  Allergen Reactions  . Metformin And Related Diarrhea    Patient Measurements: Height: 5' 7.01" (170.2 cm) Weight: 200 lb 6.4 oz (90.9 kg) IBW/kg (Calculated) : 66.12 Heparin Dosing Weight: 83.2 kg  Vital Signs: Temp: 98.2 F (36.8 C) (09/07 0722) Temp Source: Oral (09/07 0722) BP: 114/96 (09/07 0455) Pulse Rate: 79 (09/06 2337)  Labs: Recent Labs    05/11/19 0418 05/12/19 0349 05/13/19 0500  HGB 9.2*  --   --   HCT 30.7*  --   --   PLT 304  --   --   LABPROT 29.3* 26.0* 28.1*  INR 2.8* 2.4* 2.7*    Estimated Creatinine Clearance: 10.5 mL/min (A) (by C-G formula based on SCr of 8.66 mg/dL (H)).   Assessment: 21 yom w/ hx Heartmate 3 LVAD, factor V Leiden, and PAF on warfarin PTA admitted with ischemic foot now s/p transmetatarsal amputation on R foot 8/25 and stent 8/27.   INR 2.7 is therapeutic, will continue with home dose. Hgb stable on 9/5. No reported bleeding.   **Home regimen 5 mg daily except 7.5 mg on Tuesday/Thurs  Goal of Therapy:  INR: 2-3 Monitor platelets by anticoagulation protocol: Yes   Plan:  Warfarin 26m x 1 on 9/7 Monitor INR and S/S of bleeding daily  GCristela Felt PharmD PGY1 Pharmacy Resident [employer]: [phone] 05/13/2019       8:51 AM

## 2019-05-13 NOTE — Progress Notes (Addendum)
Patient ID: [name], male   DOB: 01/06/1966, 53 y.o.   MRN: [id number]   Advanced Heart Failure VAD Team Note  PCP-Cardiologist: No primary care provider on file.   Subjective:    Admitted for ischemic foot pain with need for amputation => transmetatarsal amputation right foot on 8/25.   Left leg angiogram 8/27: DES to left SFA.  1 vessel runoff via peroneal, severe disease with multilevel stenosis but unable to intervene.   Worked with PT yesterday.  "Very slight" nausea this morning.   LVAD INTERROGATION:  HeartMate 3 LVAD:   Flow 3.8 L  liters/min, speed 5500, power 4.6, PI 5.4.   Objective:    Vital Signs:   Temp:  [97.7 F (36.5 C)-98.9 F (37.2 C)] 98.2 F (36.8 C) (09/07 0722) Pulse Rate:  [58-83] 79 (09/06 2337) Resp:  [12-31] 14 (09/07 0722) BP: (96-126)/(71-105) 114/96 (09/07 0455) SpO2:  [97 %-98 %] 97 % (09/06 2337) Weight:  [90.9 kg] 90.9 kg (09/07 0455) Last BM Date: 05/12/19 Mean arterial Pressure 80s-100s  Intake/Output:   Intake/Output Summary (Last 24 hours) at 05/13/2019 0739 Last data filed at 05/12/2019 1700 Gross per 24 hour  Intake 600 ml  Output -  Net 600 ml     Physical Exam   General: Well appearing this am. NAD.  HEENT: Normal. Neck: Supple, JVP 7-8 cm. Carotids OK.  Cardiac:  Mechanical heart sounds with LVAD hum present.  Lungs:  CTAB, normal effort.  Abdomen:  NT, ND, no HSM. No bruits or masses. +BS  LVAD exit site: Well-healed and incorporated. Dressing dry and intact. No erythema or drainage. Stabilization device present and accurately applied. Driveline dressing changed daily per sterile technique. Extremities:  Warm and dry. No cyanosis, clubbing, rash, or edema. S/p right TMA.  Neuro:  Alert & oriented x 3. Cranial nerves grossly intact. Moves all 4 extremities w/o difficulty. Affect pleasant     Telemetry   Sinus 90s Personally reviewed   Labs   Basic Metabolic Panel: Recent Labs  Lab 05/07/19 0423 05/08/19  0402 05/09/19 0334 05/10/19 0500  NA 132* 133* 136 134*  K 4.0 4.3 4.1 4.5  CL 96* 96* 99 98  CO2 23 22 23 23   GLUCOSE 131* 108* 131* 159*  BUN 43* 57* 34* 49*  CREATININE 7.70* 9.31* 6.57* 8.66*  CALCIUM 9.2 9.4 8.8* 9.2    Liver Function Tests: No results for input(s): AST, ALT, ALKPHOS, BILITOT, PROT, ALBUMIN in the last 168 hours. No results for input(s): LIPASE, AMYLASE in the last 168 hours. No results for input(s): AMMONIA in the last 168 hours.  CBC: Recent Labs  Lab 05/07/19 1005 05/08/19 0402 05/09/19 0334 05/10/19 0500 05/11/19 0418  WBC 18.9* 16.3* 16.9* 16.0* 14.4*  HGB 9.2* 9.1* 9.1* 8.7* 9.2*  HCT 29.5* 30.1* 30.5* 29.4* 30.7*  MCV 91.9 92.3 93.3 95.1 93.9  PLT 280 307 303 313 304    INR: Recent Labs  Lab 05/09/19 0334 05/10/19 0500 05/11/19 0418 05/12/19 0349 05/13/19 0500  INR 2.7* 3.0* 2.8* 2.4* 2.7*    Other results:    Imaging   No results found.   Medications:     Scheduled Medications: . aspirin EC  81 mg Oral Daily  . atorvastatin  40 mg Oral q1800  . calcitRIOL  0.5 mcg Oral Q M,W,F  . Chlorhexidine Gluconate Cloth  6 each Topical Q0600  . Chlorhexidine Gluconate Cloth  6 each Topical Q0600  . citalopram  20 mg Oral  Daily  . darbepoetin (ARANESP) injection - DIALYSIS  150 mcg Intravenous Q Mon-HD  . erythromycin  250 mg Oral TID  . gabapentin  300 mg Oral BID  . hydrALAZINE  25 mg Oral 3 times per day on Sun Tue Thu Sat  . insulin aspart  0-9 Units Subcutaneous TID WC  . insulin aspart  7 Units Subcutaneous Q supper  . insulin glargine  10 Units Subcutaneous QHS  . LORazepam  0.5 mg Oral BID  . mouth rinse  15 mL Mouth Rinse BID  . metoCLOPramide  10 mg Oral TID WC  . midodrine  5 mg Oral Q M,W,F  . multivitamin  1 tablet Oral QHS  . sodium chloride flush  3 mL Intravenous Q12H  . traMADol  50 mg Oral Q6H  . Warfarin - Pharmacist Dosing Inpatient   Does not apply q1800    Infusions: . sodium chloride 10 mL/hr at  05/02/19 0636  . sodium chloride      PRN Medications: sodium chloride, acetaminophen, bisacodyl, docusate sodium, hydrALAZINE, labetalol, ondansetron (ZOFRAN) IV, oxyCODONE, promethazine, promethazine, sodium chloride flush  Assessment/Plan:    1. PAD/ischemic feet: S/p R transmetatarsal amputation on 8/25.  WBCs up to 27 post-op but afebrile, may have been reactive.  WBCs now trending down slowly.  He completed vancomycin for ?osteomyelitis on 8/19 (was getting with HD). Cultures NGTD. Left great toe dusky, now s/p left leg angiogram with SFA PCI.  There was 1 vessel runoff via left peroneal with severe multilevel stenosis => unable to intervene on peroneal.  - Currently off abx.  - Dr. Donzetta Matters following.  2. Chronic systolic CHF: Ischemic cardiomyopathy. [employer] ICD. NSTEMI in March 2018 with DES to LAD and RCA, complicated by cardiogenic shock, low output requiring milrinone. Echo 8/18 with EF 20-25%, moderate MR. CPX (8/18) with severe functional impairment due to HF. He is s/p HM3 LVAD placement. He had pump thrombosis 6/19 due to [facility] outflow graft kinking. He had pump exchange for another HM3. INR3.0 today.  - Volume managed at dialysis, MWF.   - Continue ASA 81and warfarin with INR goal 2-2.5.   - He has been referred to [facility] for evaluation for heart/kidney transplantand recently saw Dr Mosetta Pigeon =>he is not candidate for transplantnowgiven extensive PAD.  - Continue hydralazine 25 mg tid on non-HD days.  3. ESRD: HD on M-W-F with midodrine pre-HD.  4.  CAD: NSTEMI in 3/18. LHC with 99% ulcerated lesion proximal RCA with left to right collaterals, 95% mid LAD stenosis after mid LAD stent. s/p PCI to RCA and LAD on 11/25/16. No chest pain.  - Continue statin and ASA. 5. h/o DVTs: Factor V Leiden heterozygote. -Anticoagulated. 6. Diabetic gastroparesis: He has seen gastroparesis specialist at [facility]. Surprisingly, gastric emptying study was normal. However,  electrogastrogram showed poor gastric accomodation/capacity. Therefore, small frequent meals recommended.Nausea earlier this admission, now resolved.  - Continue reglan 10 mg tid/ac and Marinol.  - Continue Protonix bid.  - He will continue erythromycinindefinitely.  - Limit narcotic use as much as possible.  - Can take prn IV Phenergan for nausea, will get dose this morning.  6. DM:SSI and home insulin regimen.  7. QJJ:HERDEYCX hydralazine on non-HD days.  8. Deconditioning: Continue PT/OT. PT recommended CIR, but his insurance will not cover this.  He is doing better now per PT, should be able to go home with home PT.   9. Disposition: Home today after HD with home PT.  He will  need followup with Dr. Donzetta Matters and in [place] clinic.  He will need INR followed in LVAD clinic.  He can go home on his current meds.   I reviewed the LVAD parameters from today, and compared the results to the patient's prior recorded data.  No programming changes were made.  The LVAD is functioning within specified parameters.  The patient performs LVAD self-test daily.  LVAD interrogation was negative for any significant power changes, alarms or PI events/speed drops.  LVAD equipment check completed and is in good working order.  Back-up equipment present.   LVAD education done on emergency procedures and precautions and reviewed exit site care.  Length of Stay: 26  Loralie Champagne, MD 05/13/2019, 7:39 AM  VAD Team --- VAD ISSUES ONLY--- Pager [phone] (7am - 7am)  Advanced Heart Failure Team  Pager [phone] (M-F; 7a - 4p)  Please contact [facility] for night-coverage after hours (4p -7a ) and weekends on [email or web address]

## 2019-05-13 NOTE — Progress Notes (Addendum)
CSW has reached out to :  [place]  All home health agencies have declined to accept patient at this time, most do not take his insurance. CSW has escalated this to management to assess any potential home health agencies that can assist, awaiting response from supervisor.   RN made aware, will notify MD as well. Unclear if this will delay patient's discharge.   CSW lvm with [employer] reps to assist in identifying home health agnecy, awaiting call back. Due holiday this could be delayed CSW will continue to follow up following patient's discharge to identify home health agency to accept.   [place], [place]

## 2019-05-13 NOTE — Discharge Summary (Signed)
Discharge Summary    Patient ID: [name],  MRN: [id number], DOB/AGE: [birth year] 53 y.o.  Admit date: 04/26/2019 Discharge date: 05/13/2019  Primary Care Provider: Richarda Osmond Primary Cardiologist: Loralie Champagne, MD  Discharge Diagnoses    Principal Problem:   Ischemic foot Active Problems:   Automatic implantable cardioverter-defibrillator in situ   Gastroparesis   ESRD on dialysis [facility])   LVAD (left ventricular assist device) present [facility]   Allergies Allergies  Allergen Reactions   Metformin And Related Diarrhea    Diagnostic Studies/Procedures    Lower Extremity Angiography 05/02/2019: Procedure Performed: 1.Ultrasound-guided access of the right common femoral artery 2.Left lower extremity arteriogram with selection of third order branches 3.Left SFA stent (6 mm x 60 mm drug-coatedEluviastentpostdilated with a 5 mm Mustang) 4.Mynx closure of the right common femoral artery 5.66mnutes of monitored moderate conscious sedationtime  Indications:Patient is a 53year old male who has extensive medical history that presented with tissue loss in his lower extremities. He underwent right lower extremity interventionwith Dr. CIvette Loyalin the week. He presents today for left lower extremity intervention in the setting of SFA stenosis as well as single-vessel peroneal diseasedrunoff.  Findings:Aortogram was deferred since it was performed earlier in the week. Dedicated left lower extremity arteriogram showed approximate 50% stenosis in the mid SFA. Patient only had single runoff via the peroneal artery that appeared inline down to the foot but was heavily diseased throughout multiple segments with heavily calcified multifocal high-grade stenosis and/or short occlusions.  Ultimately cannulated the left peroneal with a V 18 and attempted to get down to the ankle. Unfortunately in the mid calf I could notget my wire down the true  lumen of the vessel and was ina subintimal plane and also had a small perforation. I tried multiple wires and catheters including an 014 and still could not get down the true lumen of the artery distal to this lesion. Possible short focal occlusion. Ultimately elected to stop given single-vessel runoff. The left SFA was then treated with a 6 mm x 60 mm drug-coated stent andpostdilated with a 5 mm balloon. He had preserved single-vessel peroneal runoff atthe end of thecase. _____________   History of Present Illness     [name] a 53year old withCAD s/p anterior MI in 2010 and NSTEMI in 3/18 with DES to pRCA and mLAD, gastroparesis,ischemic cardiomyopathy now s/p Heartmate 3 LVAD and ESRD, factor V leiden, PAF, and ischemic foot.He has had multiple admits for N/V.   Admitted 7/10 with ischemic right foot. VVS consulted and on 7/13 he underwent peripheral angiogram that showed 80% stenosis r SFV and occluded right PT and had stent to right SFA and angioplasty to PT. He then had 4th toe amputation. He was discharged to home on 03/22/19.  Admitted 03/27/19 withrecurrent n/v, leukocytosis and ischemic changes in right foot. Admit ICU for treatment of severe gastroparesisand possible ischemic foot. VVS , Nephrology, ID consulted. He was treated with IV vancomycin for possible osteomyelitis/infection, but thought that more likely ischemia could explain all the findings. He was discharged home to followup with vascular surgery.   He has been followed by Dr CDonzetta Matterswith plans for amputation 04/30/2019. After completion of his dialysis session 04/26/2019 he developed severe pain in his right foot. Says he took oxycodone earlier today and has had no relief. He denies SOB/nasea/vomiting. Denies BRBPR. Unable to place weight on his right foot.    LVAD INTERROGATION:  HeartMate III LVAD:  Flow 3.9  liters/min, speed 5500, power 4.4  PI 5.4 .    Hospital Course     Consultants: Vascular  surgery, Nephrology  1. PAD/ischemic feet: S/p R transmetatarsal amputation on 8/25.  WBCs up to 27 post-op but afebrile, may have been reactive.  WBCs now trending down slowly.  He completed vancomycin for ?osteomyelitis on 8/19 (was getting with HD). Cultures NGTD. Left great toe dusky, now s/p left leg angiogram with SFA PCI.  There was 1 vessel runoff via left peroneal with severe multilevel stenosis => unable to intervene on peroneal.  - Currently off abx.  - Dr. Donzetta Matters to follow closely outpatient  2. Chronic combined CHF: Ischemic cardiomyopathy. St Jude ICD. NSTEMI in March 2018 with DES to LAD and RCA, complicated by cardiogenic shock, low output requiring milrinone. Echo 8/18 with EF 20-25%, moderate MR. CPX (8/18) with severe functional impairment due to HF. He is s/p HM3 LVAD placement. He had pump thrombosis 6/19 due to [facility] outflow graft kinking. He had pump exchange for another HM3. INR3.0 today.  - Volume managed at dialysis, MWF.   - Continue ASA 81and warfarin with INR goal 2-2.5.   - He has been referred to [facility] for evaluation for heart/kidney transplantand recently saw Dr Mosetta Pigeon, Rico Junker is not candidate for transplantnowgiven extensive PAD. - Continue hydralazine 25 mg tid on non-HD days.   3. ESRD: HD on M-W-F with midodrine pre-HD.   4.CAD: NSTEMI in 3/18. LHC with 99% ulcerated lesion proximal RCA with left to right collaterals, 95% mid LAD stenosis after mid LAD stent. s/p PCI to RCA and LAD on 11/25/16.No chest pain.  - Continue statin and ASA.  5. h/o DVTs: Factor V Leiden heterozygote. -Continue coumadin  6. Diabetic gastroparesis: He has seen gastroparesis specialist at [facility]. Surprisingly, gastric emptying study was normal. However, electrogastrogram showed poor gastric accomodation/capacity. Therefore, small frequent meals recommended.Nausea earlier this admission, now resolved.  - Continue reglan 10 mg tid/ac and Marinol.  - Continue  Protonix bid.  - He will continue erythromycinindefinitely.  - Limit narcotic use as much as possible.  - Can take prn Phenergan for nausea, will get dose this morning prior to discharge.   6. KG:YJEHUDJSHF on SSI inpatient and home insulin regimen resumed at discharge  7. HTN: - Continue hydralazine on non-HD days.   8. Deconditioning: Continue PT/OT. PT recommended CIR, but his insurance will not cover this.  He is doing better now per PT, should be able to go home with home PT.    9. Disposition: Home today after HD with home PT.  Message sent to Dr. Donzetta Matters to arrange close outpatient following. Patient will follow closely in the LVAD clinic.  He will need INR followed in LVAD clinic.  He can go home on his current meds.   Dr. Aundra Dubin reviewed the LVAD parameters from today, and compared the results to the patient's prior recorded data.  No programming changes were made.  The LVAD is functioning within specified parameters.  The patient performs LVAD self-test daily.  LVAD interrogation was negative for any significant power changes, alarms or PI events/speed drops.  LVAD equipment check completed and is in good working order.  Back-up equipment present.   LVAD education done on emergency procedures and precautions and reviewed exit site care. _____________  Discharge Vitals Blood pressure (!) 114/96, pulse 79, temperature 98.2 F (36.8 C), temperature source Oral, resp. rate 14, height 5' 7.01" (1.702 m), weight 90.9 kg, SpO2 97 %.  Filed Weights  05/10/19 1110 05/12/19 0500 05/13/19 0455  Weight: 85.1 kg 90.3 kg 90.9 kg    Labs & Radiologic Studies    CBC Recent Labs    05/11/19 0418  WBC 14.4*  HGB 9.2*  HCT 30.7*  MCV 93.9  PLT 169   Basic Metabolic Panel No results for input(s): NA, K, CL, CO2, GLUCOSE, BUN, CREATININE, CALCIUM, MG, PHOS in the last 72 hours. Liver Function Tests No results for input(s): AST, ALT, ALKPHOS, BILITOT, PROT, ALBUMIN in the last 72  hours. No results for input(s): LIPASE, AMYLASE in the last 72 hours. Cardiac Enzymes No results for input(s): CKTOTAL, CKMB, CKMBINDEX, TROPONINI in the last 72 hours. BNP Invalid input(s): POCBNP D-Dimer No results for input(s): DDIMER in the last 72 hours. Hemoglobin A1C No results for input(s): HGBA1C in the last 72 hours. Fasting Lipid Panel No results for input(s): CHOL, HDL, LDLCALC, TRIG, CHOLHDL, LDLDIRECT in the last 72 hours. Thyroid Function Tests No results for input(s): TSH, T4TOTAL, T3FREE, THYROIDAB in the last 72 hours.  Invalid input(s): FREET3 _____________  Dg Chest Port 1 View  Result Date: 05/02/2019 CLINICAL DATA:  Preop chest exam.  Coronary artery disease. EXAM: PORTABLE CHEST 1 VIEW COMPARISON:  Radiographs of April 26, 2019. FINDINGS: Stable cardiomegaly. Left ventricular assist device is unchanged in position. Left-sided pacemaker is unchanged in position. Left internal jugular dialysis catheter is unchanged in position. Right internal jugular catheter is stable. No pneumothorax is noted. Left lung is clear. Elevated right hemidiaphragm is noted with mild right basilar subsegmental atelectasis. Bony thorax is unremarkable. IMPRESSION: Stable support apparatus. Stable elevated right hemidiaphragm is noted with mild right basilar subsegmental atelectasis. Electronically Signed   By: Marijo Conception M.D.   On: 05/02/2019 07:14   Dg Chest Port 1 View  Result Date: 04/26/2019 CLINICAL DATA:  Encounter for central line placement EXAM: PORTABLE CHEST 1 VIEW COMPARISON:  03/27/2019 FINDINGS: Patient has a RIGHT IJ central line, tip to the superior vena cava. A LEFT IJ dialysis catheter tip overlies the level of the superior vena cava. Patient has a LEFT-sided transvenous pacemaker with leads to the RIGHT atrium and RIGHT ventricle. LEFT ventricular assist device appears unchanged. No pneumothorax. Heart is mildly enlarged and stable in configuration. Stable elevation of  the RIGHT hemidiaphragm. Lungs are clear. No edema. IMPRESSION: RIGHT IJ central line tip to the superior vena cava. No pneumothorax. Electronically Signed   By: Nolon Nations M.D.   On: 04/26/2019 20:23   Disposition   Patient was seen and examined by Dr. Aundra Dubin who deemed patient as stable for discharge. Follow-up has been arranged. Discharge medications as listed below.   Follow-up Plans & Appointments    Follow-up Information    [facility] Follow up on 05/21/2019.   Specialty: Cardiology Why: Keep appointment with the VAD clinic at 11 AM Contact information: [address] [id number] [place]       Larey Dresser, MD Follow up on 05/21/2019.   Specialty: Cardiology Why: Please arrive 15 minutes early for your 11:00am post-hospital follow-up appointment Contact information: [place] [address] [phone]          Discharge Instructions    ([place]) Call MD:  Anytime you have any of the following symptoms: 1) 3 pound weight gain in 24 hours or 5 pounds in 1 week 2) shortness of breath, with or without a dry hacking cough 3)  swelling in the hands, feet or stomach 4) if you have to sleep on extra pillows at night in order to breathe.   Complete by: As directed    Diet - low sodium heart healthy   Complete by: As directed    Diet - low sodium heart healthy   Complete by: As directed    Diet Carb Modified   Complete by: As directed    Increase activity slowly   Complete by: As directed    Increase activity slowly   Complete by: As directed       Discharge Medications   Allergies as of 05/13/2019      Reactions   Metformin And Related Diarrhea      Medication List    TAKE these medications   acetaminophen 325 MG tablet Commonly known as: TYLENOL Take 2 tablets (650 mg total) by mouth every 4 (four) hours as needed for headache or mild  pain.   aspirin EC 81 MG tablet Take 1 tablet (81 mg total) by mouth daily.   atorvastatin 40 MG tablet Commonly known as: LIPITOR Take 1 tablet (40 mg total) by mouth daily at 6 PM.   calcitRIOL 0.5 MCG capsule Commonly known as: ROCALTROL Take 1 capsule (0.5 mcg total) by mouth every Monday, Wednesday, and Friday.   citalopram 20 MG tablet Commonly known as: CELEXA Take 1 tablet (20 mg total) by mouth daily.   diclofenac sodium 1 % Gel Commonly known as: VOLTAREN Apply 4 g topically 4 (four) times daily. What changed:   when to take this  reasons to take this   docusate sodium 100 MG capsule Commonly known as: COLACE Take 1 capsule (100 mg total) by mouth daily as needed for mild constipation.   dronabinol 2.5 MG capsule Commonly known as: MARINOL Take 1 capsule (2.5 mg total) by mouth 2 (two) times daily before lunch and supper.   erythromycin 250 MG tablet Commonly known as: E-MYCIN Take 1 tablet (250 mg total) by mouth 3 (three) times daily.   gabapentin 300 MG capsule Commonly known as: NEURONTIN Take 1 capsule (300 mg total) by mouth 2 (two) times daily.   hydrALAZINE 25 MG tablet Commonly known as: APRESOLINE Take 1 tablet (25 mg total) by mouth 3 (three) times daily. Give ONLY on Sunday, Tuesday, Thursday, Saturday (non-HD days)   insulin glargine 100 UNIT/ML injection Commonly known as: LANTUS Inject 0.1 mLs (10 Units total) into the skin daily. What changed: when to take this   LORazepam 0.5 MG tablet Commonly known as: ATIVAN Take 1 tablet (0.5 mg total) by mouth every 12 (twelve) hours as needed (nausea).   metoCLOPramide 10 MG tablet Commonly known as: REGLAN Take 1 tablet (10 mg total) by mouth 3 (three) times daily. What changed: when to take this   midodrine 5 MG tablet Commonly known as: PROAMATINE Take 1 tablet (5 mg total) by mouth daily. Take 1 tablet before dialysis treatment What changed: when to take this   multivitamin Tabs  tablet Take 1 tablet by mouth daily.   NovoLOG 100 UNIT/ML injection Generic drug: insulin aspart INJECT 7 UNITS INTO THE SKIN DAILY WITH SUPPER. What changed: See the new instructions.   oxyCODONE 5 MG immediate release tablet Commonly known as: Roxicodone Take 1-2 tablets every 4-6 hours for severe pain. What changed:   how much to take  how to take this  when to take this  reasons to take this  additional instructions   pantoprazole 40  MG tablet Commonly known as: Protonix Take 1 tablet (40 mg total) by mouth 2 (two) times daily before lunch and supper.   promethazine 12.5 MG tablet Commonly known as: PHENERGAN Take 1 tablet (12.5 mg total) by mouth every 6 (six) hours as needed for nausea or vomiting.   traMADol 50 MG tablet Commonly known as: ULTRAM Take 1 tablet (50 mg total) by mouth every 6 (six) hours as needed for moderate pain.   warfarin 5 MG tablet Commonly known as: COUMADIN Take as directed. If you are unsure how to take this medication, talk to your nurse or doctor. Original instructions: TAKE AS FOLLOWS: MON-WED-FRI 1&1/2 TABLETS DAILY. ON ALL OTHER DAYS 1 TABLET What changed:   how much to take  how to take this  when to take this  additional instructions          Outstanding Labs/Studies   INR check at LVAD appt  Duration of Discharge Encounter   Greater than 30 minutes including physician time.  Signed, Abigail Butts PA-C 05/13/2019, 8:43 AM

## 2019-05-13 NOTE — Procedures (Signed)
Patient seen on Hemodialysis. BP (!) (P) 121/94   Pulse (P) 66   Temp 98.2 F (36.8 C) (Oral)   Resp (P) 15   Ht 5' 7.01" (1.702 m)   Wt 90.9 kg   SpO2 (P) 96%   BMI 31.38 kg/m   QB 400 ([facility] TDC), UF goal 3.5L Tolerating treatment without complaints at this time.   Elmarie Shiley MD [facility]. Office # [phone] Pager # [phone] 8:58 AM

## 2019-05-13 NOTE — Progress Notes (Signed)
PT Cancellation Note  Patient Details Name: TYREESE THAIN MRN: [id number] DOB: [birth year]   Cancelled Treatment:    Reason Eval/Treat Not Completed: Patient at procedure or test/unavailable(Pt in HD.  Will check back as able. )   Denice Paradise 05/13/2019, 9:27 AM  [place] Pager:  [phone]  Office:  [phone]

## 2019-05-13 NOTE — Progress Notes (Addendum)
Physical Therapy Treatment Patient Details Name: [name] MRN: [id number] DOB: [birth year] Today's Date: 05/13/2019    History of Present Illness 53 yo male admitted for ischemic foot pain with need for amputation => transmetatarsal amputation right foot on 8/25. PMH - LVAD 10/18 with emergent pump exchange 6/19, ESRD on HD, CHF, CAD, DM, cardiomyopathy    PT Comments    Pt admitted with above diagnosis. Pt back from dialysis.  Gave pt handouts for Energy conservation, up and down steps in wheelchair, up and down steps backwards with RW and for building a ramp.  Measured pt for wheelchair and adjusted his gait belt. Pt appreciative and going home today. Pt currently with functional limitations due to balance and endurance deficits.  Pt will benefit from skilled PT to increase their independence and safety with mobility to allow discharge to the venue listed below.     Follow Up Recommendations  Home health PT;Supervision/Assistance - 24 hour(HHOT, HH aide)     Equipment Recommendations  Wheelchair (18x16 lightweight wheelchair with anti-tippers, desk armrests and elevating legrests);Wheelchair cushion (18x16 pressure relieving cushion);Other (comment)(ramp recommended, pt refuses saying his son will lift him up the steps)    Recommendations for Other Services       Precautions / Restrictions Precautions Precautions: Fall;Other (comment) Precaution Comments: LVAD Required Braces or Orthoses: Other Brace Other Brace: Darco shoe--R Restrictions Weight Bearing Restrictions: Yes RLE Weight Bearing: Weight bearing as tolerated Other Position/Activity Restrictions:  pt to weight bear on rt heel     Mobility  Bed Mobility Overal bed mobility: Modified Independent Bed Mobility: Supine to Sit     Supine to sit: Supervision     General bed mobility comments: bed flat, no rail, increased time; measured gait belt to pt  Transfers                    Ambulation/Gait                  Stairs             Wheelchair Mobility    Modified Rankin (Stroke Patients Only)       Balance Overall balance assessment: Needs assistance Sitting-balance support: No upper extremity supported;Feet supported Sitting balance-[name]: Good                                      Cognition Arousal/Alertness: Awake/alert Behavior During Therapy: WFL for tasks assessed/performed Overall Cognitive Status: Impaired/Different from baseline Area of Impairment: Following commands;Safety/judgement                       Following Commands: Follows one step commands consistently Safety/Judgement: Decreased awareness of safety;Decreased awareness of deficits            Exercises      General Comments General comments (skin integrity, edema, etc.): measured for wheelchair, gave pt handouts Energy conservation, up and down steps in wheelchair, up and down steps with walker backwards, and how to build a ramp and educated pt to each.       Pertinent Vitals/Pain Pain Assessment: No/denies pain    Home Living                      Prior Function            PT Goals (current goals can now be found in  the care plan section) Acute Rehab PT Goals Patient Stated Goal: "I want to be able to walk independently again" Progress towards PT goals: Progressing toward goals    Frequency    Min 3X/week      PT Plan Current plan remains appropriate    Co-evaluation              AM-PAC PT "6 Clicks" Mobility   Outcome Measure  Help needed turning from your back to your side while in a flat bed without using bedrails?: None Help needed moving from lying on your back to sitting on the side of a flat bed without using bedrails?: None Help needed moving to and from a bed to a chair (including a wheelchair)?: A Little Help needed standing up from a chair using your arms (e.g., wheelchair or bedside chair)?: A  Little Help needed to walk in hospital room?: A Little Help needed climbing 3-5 steps with a railing? : Total 6 Click Score: 18    End of Session Equipment Utilized During Treatment: Gait belt Activity Tolerance: Patient tolerated treatment well Patient left: with call bell/phone within reach;in bed Nurse Communication: Mobility status PT Visit Diagnosis: Other abnormalities of gait and mobility (R26.89);Pain;Unsteadiness on feet (R26.81) Pain - Right/Left: Right Pain - part of body: Ankle and joints of foot     Time: 6144-3154 PT Time Calculation (min) (ACUTE ONLY): 15 min  Charges:  $Therapeutic Activity: 8-22 mins                     Anella Nakata,PT Acute Rehabilitation Services Pager:  [phone]  Office:  [place] 05/13/2019, 1:50 PM

## 2019-05-14 ENCOUNTER — Encounter (HOSPITAL_COMMUNITY): Payer: BC Managed Care – PPO

## 2019-05-14 ENCOUNTER — Telehealth (HOSPITAL_COMMUNITY): Payer: Self-pay | Admitting: Licensed Clinical Social Worker

## 2019-05-14 NOTE — Progress Notes (Signed)
Patient suffers from CHF with LVAD, ESRD on hemodialysis, CAD, and transmetatarsal amputation of right foot which impairs their ability to perform daily activities like ambulating in the home. A cane, crutch, walker will not resolve  issue with performing activities of daily living. A wheelchair will allow patient to safely perform daily activities. Patient is not able to propel themselves in the home using a standard weight wheelchair due to general weakness and endurance. Patient can self propel in the lightweight wheelchair. Length of need 12 months .  Accessories: elevating leg rests (ELRs), wheel locks, extensions and anti-tippers.   Per PT, patient needs wheelchair (18x16 lightweight wheelchair with anti-tippers, desk armrests and elevating legrests);Wheelchair cushion (18x16 pressure relieving cushion)

## 2019-05-14 NOTE — Telephone Encounter (Signed)
CSW received referral to contact patient regarding questions about a ramp. Patient states he is looking for a wheelchair ramp for his home. Patient states he has two steps outside his home. CSW provided some information and suggested looking at [employer] and [employer] web sites to see if those might work for his needs. Patient will explore and return call to CSW if he finds one that would work for him. CSW continues to follow as needed. Raquel Sarna, [place], [place]

## 2019-05-17 ENCOUNTER — Other Ambulatory Visit (HOSPITAL_COMMUNITY): Payer: Self-pay | Admitting: Unknown Physician Specialty

## 2019-05-17 DIAGNOSIS — R110 Nausea: Secondary | ICD-10-CM

## 2019-05-17 DIAGNOSIS — K3184 Gastroparesis: Secondary | ICD-10-CM

## 2019-05-17 DIAGNOSIS — E1143 Type 2 diabetes mellitus with diabetic autonomic (poly)neuropathy: Secondary | ICD-10-CM

## 2019-05-17 MED ORDER — TRAMADOL HCL 50 MG PO TABS: 50.0000 mg | ORAL_TABLET | Freq: Four times a day (QID) | ORAL | 0 refills | Status: DC | PRN

## 2019-05-17 MED ORDER — LORAZEPAM 0.5 MG PO TABS: 0.5000 mg | ORAL_TABLET | Freq: Two times a day (BID) | ORAL | 2 refills | Status: DC | PRN

## 2019-05-20 ENCOUNTER — Telehealth: Payer: Self-pay | Admitting: Student in an Organized Health Care Education/Training Program

## 2019-05-20 ENCOUNTER — Ambulatory Visit (HOSPITAL_COMMUNITY): Payer: Self-pay | Admitting: Pharmacist

## 2019-05-20 DIAGNOSIS — Z5181 Encounter for therapeutic drug level monitoring: Secondary | ICD-10-CM

## 2019-05-20 DIAGNOSIS — Z95811 Presence of heart assist device: Secondary | ICD-10-CM

## 2019-05-20 LAB — POCT INR: INR: 4.3 — AB (ref 2.0–3.0)

## 2019-05-20 NOTE — Telephone Encounter (Signed)
[facility] is seeing the patient for PT and OT.They would like to add skilled nursing if the doctor is okay with this. Please call and let them know. [phone], jw

## 2019-05-20 NOTE — Progress Notes (Signed)
LVAD INR 

## 2019-05-21 ENCOUNTER — Ambulatory Visit (HOSPITAL_COMMUNITY)
Admission: RE | Admit: 2019-05-21 | Discharge: 2019-05-21 | Disposition: A | Discharge status: Home / Self Care | Payer: BC Managed Care – PPO | Source: Ambulatory Visit | Attending: Cardiology | Admitting: Cardiology

## 2019-05-21 ENCOUNTER — Telehealth (HOSPITAL_COMMUNITY): Payer: Self-pay | Admitting: *Deleted

## 2019-05-21 ENCOUNTER — Other Ambulatory Visit: Payer: Self-pay | Admitting: *Deleted

## 2019-05-21 ENCOUNTER — Ambulatory Visit (HOSPITAL_COMMUNITY): Payer: Self-pay | Admitting: Pharmacist

## 2019-05-21 ENCOUNTER — Telehealth: Payer: Self-pay

## 2019-05-21 ENCOUNTER — Encounter (HOSPITAL_COMMUNITY): Payer: Self-pay

## 2019-05-21 ENCOUNTER — Other Ambulatory Visit: Payer: Self-pay

## 2019-05-21 VITALS — BP 100/0 | HR 69 | Temp 98.4°F | Ht 68.0 in | Wt 195.0 lb

## 2019-05-21 DIAGNOSIS — I2510 Atherosclerotic heart disease of native coronary artery without angina pectoris: Secondary | ICD-10-CM | POA: Diagnosis not present

## 2019-05-21 DIAGNOSIS — E785 Hyperlipidemia, unspecified: Secondary | ICD-10-CM | POA: Diagnosis not present

## 2019-05-21 DIAGNOSIS — K3184 Gastroparesis: Secondary | ICD-10-CM

## 2019-05-21 DIAGNOSIS — M79671 Pain in right foot: Secondary | ICD-10-CM | POA: Insufficient documentation

## 2019-05-21 DIAGNOSIS — Z89431 Acquired absence of right foot: Secondary | ICD-10-CM | POA: Insufficient documentation

## 2019-05-21 DIAGNOSIS — D6851 Activated protein C resistance: Secondary | ICD-10-CM | POA: Insufficient documentation

## 2019-05-21 DIAGNOSIS — Z95811 Presence of heart assist device: Secondary | ICD-10-CM

## 2019-05-21 DIAGNOSIS — Z79899 Other long term (current) drug therapy: Secondary | ICD-10-CM | POA: Insufficient documentation

## 2019-05-21 DIAGNOSIS — E118 Type 2 diabetes mellitus with unspecified complications: Secondary | ICD-10-CM

## 2019-05-21 DIAGNOSIS — R52 Pain, unspecified: Secondary | ICD-10-CM | POA: Diagnosis not present

## 2019-05-21 DIAGNOSIS — Z7901 Long term (current) use of anticoagulants: Secondary | ICD-10-CM | POA: Insufficient documentation

## 2019-05-21 DIAGNOSIS — E1143 Type 2 diabetes mellitus with diabetic autonomic (poly)neuropathy: Secondary | ICD-10-CM | POA: Diagnosis not present

## 2019-05-21 DIAGNOSIS — E1122 Type 2 diabetes mellitus with diabetic chronic kidney disease: Secondary | ICD-10-CM | POA: Insufficient documentation

## 2019-05-21 DIAGNOSIS — I255 Ischemic cardiomyopathy: Secondary | ICD-10-CM | POA: Insufficient documentation

## 2019-05-21 DIAGNOSIS — I5022 Chronic systolic (congestive) heart failure: Secondary | ICD-10-CM | POA: Diagnosis not present

## 2019-05-21 DIAGNOSIS — N186 End stage renal disease: Secondary | ICD-10-CM | POA: Diagnosis not present

## 2019-05-21 DIAGNOSIS — I252 Old myocardial infarction: Secondary | ICD-10-CM | POA: Insufficient documentation

## 2019-05-21 DIAGNOSIS — R109 Unspecified abdominal pain: Secondary | ICD-10-CM | POA: Insufficient documentation

## 2019-05-21 DIAGNOSIS — Z794 Long term (current) use of insulin: Secondary | ICD-10-CM

## 2019-05-21 DIAGNOSIS — I132 Hypertensive heart and chronic kidney disease with heart failure and with stage 5 chronic kidney disease, or end stage renal disease: Secondary | ICD-10-CM | POA: Insufficient documentation

## 2019-05-21 DIAGNOSIS — Z9049 Acquired absence of other specified parts of digestive tract: Secondary | ICD-10-CM | POA: Diagnosis not present

## 2019-05-21 DIAGNOSIS — K219 Gastro-esophageal reflux disease without esophagitis: Secondary | ICD-10-CM | POA: Insufficient documentation

## 2019-05-21 DIAGNOSIS — I998 Other disorder of circulatory system: Secondary | ICD-10-CM

## 2019-05-21 DIAGNOSIS — Z955 Presence of coronary angioplasty implant and graft: Secondary | ICD-10-CM | POA: Diagnosis not present

## 2019-05-21 DIAGNOSIS — K5090 Crohn's disease, unspecified, without complications: Secondary | ICD-10-CM | POA: Insufficient documentation

## 2019-05-21 DIAGNOSIS — Z86718 Personal history of other venous thrombosis and embolism: Secondary | ICD-10-CM | POA: Insufficient documentation

## 2019-05-21 DIAGNOSIS — Z7982 Long term (current) use of aspirin: Secondary | ICD-10-CM | POA: Insufficient documentation

## 2019-05-21 DIAGNOSIS — R112 Nausea with vomiting, unspecified: Secondary | ICD-10-CM | POA: Insufficient documentation

## 2019-05-21 DIAGNOSIS — Z992 Dependence on renal dialysis: Secondary | ICD-10-CM

## 2019-05-21 LAB — POCT INR: INR: 4.3 — AB (ref 2.0–3.0)

## 2019-05-21 MED ORDER — OXYCODONE HCL 5 MG PO TABS: 5.0000 mg | ORAL_TABLET | Freq: Two times a day (BID) | ORAL | 0 refills | Status: DC | PRN

## 2019-05-21 NOTE — Telephone Encounter (Signed)
Cloyde Reams, RN from [facility] called today and stated they saw the patient today and left great toe is now black. Patient is not able to ambulate and was given 2 weeks supply of Oxycodone today. Would like for patient to be seen before 9/25. Appt made for 9/17.

## 2019-05-21 NOTE — Progress Notes (Addendum)
Patient presents for hospital discharge follow up in [facility] today with his son. Reports no problems with VAD equipment or concerns with drive line.   Pt arrives in a wheel chair today. He is having difficulty walking due to the pain in his right foot; he is unable to put any weight on the right foot. Foot is warm to the touch with audible DT pulse. Gauze dressing over incision on amputee site, sutures intact, site oozing bloody drainage. Pt's INR was 4.3 yesterday and has been addressed per Audry Riles, PharmD.    Pt reports pain 10/10 when he tries to bear weight on right foot. He also reports dialysis center has had to lower his dialysis flow due to increased pain in right foot during dialysis. Pt is taking gabapentin 300 mg twice daily and tramadol 50 mg q 6 hrs at home for pain. Discussed with Dr. Aundra Dubin, Rx for 2 week supply of Oxycodone given to patient; will refer him to pain clinic for chronic pain management.   Pt reports left great toe is "darker"; shoe removed and toe assessed per Dr. Aundra Dubin with blackening of great toe. Pt has f/u with Dr. Claretha Cooper office on 05/31/19; VAD Coordinator will call for earlier appointment.   Pt needs total assistance to stand to weight and move from w/c to bed. Son is helping at home. PT came first visit yesterday and pt reports they assisted him transfer from bed to w/c and back. Pt is unable to use walker or crutches, reports he is w/c bound.   Unable to draw labs today due to difficult venous access. Will fax lab orders to [facility].    Vital Signs:  Temp: 98.4 Doppler Pressure  100 Automatc BP:  97/62 (78) HR:  90 SPO2: 98%  Weight: 195 lb w/o eqt Last weight: 200 lb   VAD Indication: Destination therapy- Pt is no longer candidate for heart/kidney transplant due to PAD per [facility].   VAD interrogation & Equipment Management: Speed: 5500 Flow: 4.5 Power: 4.5w    PI: 4.0  Alarms: none Events: 5-10 daily  Fixed speed  5500 Low speed limit: 5200  Primary Controller:  Replace back up battery in 16 months. Back up controller:   Replace back up battery in 21 months.  Annual Equipment Maintenance on UBC/PM was performed on 06/2017.   I reviewed the LVAD parameters from today and compared the results to the patient's prior recorded data. LVAD interrogation was NEGATIVE for significant power changes, NEGATIVE for clinical alarms and STABLE for PI events/speed drops. No programming changes were made and pump is functioning within specified parameters. Pt is performing daily controller and system monitor self tests along with completing weekly and monthly maintenance for LVAD equipment.  LVAD equipment check completed and is in good working order. Back-up equipment present.  Exit Site Care: Drive line is being maintained weekly by daughter. CDI today. Anchor secure. Pt provided with 4 weekly kits and extra anchors for home use. Exit site healed and incorporated, the velour is fully implanted at exit site. No redness, tenderness, drainage, foul odor or rash noted.  Drive line anchor secure. Pt denies fever or chills.    Significant Events on VAD Support: -10/2016>> poss drive line infection, CT ABD neg, ID consult-doxy -11/09/16>> admit for poss drive line infection, IV abx -03/24/17>> doxy for poss drive line infection -05/04/17>> drive line debridement with wound-vac -06/2017>> drive line +proteus, IV abx, Bactrim x14 days  Device:St jude Dual Therapies: on  200 bpm Last check: 01/01/18 - 2 hrs Afl/Afib; EMI 04/02/18  BP & Labs: -  Doppler BP 100 - is reflecting Modified systolic  Hgb not done today; difficult venous access - No S/S of bleeding. Specifically denies melena/BRBPR or nosebleeds.  LDH not done today, difficult venous access - established baseline of 245- 350. Denies tea-colored urine. No power elevations noted on interrogation.    Patient Instructions:  1. Change coumadin dose to 5 mg  daily. Re-check home INR Friday. 2. Rx given for 30 day supply of Oxy 3. Will refer you to pain clinic 4. Will call Dr. Claretha Cooper office and ask for sonner f/u appt for you 5. Return to [facility] in 6 weeks.  Zada Girt RN [place] Coordinator  Office: [phone]  24/7 Pager: [phone]      Cardiology: Dr. Aundra Dubin  HPI: 53 y.o. with history of CAD s/p anterior MI in 2010 and NSTEMI in 3/18 with DES to pRCA and mLAD and ischemic cardiomyopathy now s/p Heartmate 3 LVAD and ESRD presents for followup of CHF/LVAD.  Patient developed low output heart failure.  He was not deemed to be a good transplant candidate.  He was admitted in 10/18 and Heartmate 3 LVAD was placed.  He had baseline CKD stage 3 likely from diabetic nephropathy and CHF.  He developed peri-op AKI and ended up on dialysis.  No renal recovery to date.  St Jude ICD has been turned back on and ramp echo was done prior to discharge in 10/18, speed increased to 5600 rpm.   He was admitted in 11/18 with nausea/vomiting and abdominal pain.  This appears to have been due to diabetic gastroparesis (extensive GI evaluation). He had atrial flutter this admission that was converted to NSR with amiodarone.  He improved considerably with scheduled Reglan and erythromycin. He was re-admitted later in 11/18 with the same symptoms, suspect diabetic gastroparesis and was again treated with erythromycin.  He was not able to start domperidone due to prolonged QT interval. He had nausea again about a week ago but was able to manage at home with Phenergan suppositories.   Admitted 1/8 -> 09/16/17 for uncontrolled vomiting in setting of diabetic gastroparesis. Improved with phenergan suppositories and given erythromycin TID for 7 days post-discharge.    He was admitted in 2/19 for AV graft placement.  While in the hospital, we turned down his speed.  Since then, he has had considerably fewer PI events on HD days.    He was admitted in 4/19 with  nausea/vomiting again, probably gastroparesis flare.   He was admitted in 5/19 with nausea/vomiting, likely gastroparesis flare, treated with erythromycin course which he is finishing.   He has been evaluated at [facility] by Dr. Derrill Kay (gastroparesis specialist).   Gastric emptying study, surprisingly, was near normal. However, electrogastrogram showed poor gastric accomodation/capacity, "bradygastria."  Frequent small meals were recommended. Also, early am nausea was thought to be possible GERD.    Admitted 6/26 - 03/19/18 for emergent VAD exchange 03/01/18 in the setting of HM3 outflow graft kink. He required CVVHD post op, but was transt[facility] back to [facility] without problems. Course complicated by leukocytosis and fever. CT abd/chest negative for infection and blood cultures were negative. Post op pain well controlled withraretramadol. Able to tolerate diet. Evaluated by PT/OT and HH PT recommended for discharge. Ramp echo completed prior to discharge with speed optimization. Resumed81 mg ASA + coumadin with INR goal 2-3.  He was admitted 03/23/18 with nausea/gastroparesis symptoms and marked  HTN at outpatient HD.  Erythromycin was restarted and gastroparesis symptoms were controlled with fall in BP and discharge on 03/25/18.   He was admitted in 9/19 for LUE AV fistula placement.  He was readmitted in 10/19 with his typical gastroparesis symptoms, unable to take po. The fistula later failed and was replaced with a graft.  However, the graft was nonfunctional and developed a hematoma at the surgical site.    He was admitted in 12/19 with nausea/vomiting from HD. Also with LDH increased to 427 in setting of extensive hematoma around his left arm AV graft.  He has had tingling in his left hand and left grip is weak.    In 7/20, he was admitted with ischemic 4th toe on right foot.  Peripheral angiogram 03/18/19 showed 80% right SFA stenosis and occluded right PT. He had stenting of SFA and PTCA of PT with  restoration of flow. However, he still needed amputation right 4th toe on 7/16 with intractable pain.  He was readmitted with pain in his right foot, nausea, and vomiting. There was concern for ongoing right foot ischemia with all toes and a portion of the foot now dusky.  He was treated with IV vancomycin for possible osteomyelitis/infection, but thought that more likely ischemia could explain all the findings.  He was discharged home to followup with vascular surgery.   He was re-admitted in 8/20 with ongoing right foot pain and ended up having transmetatarsal amputation of the right foot.  Left leg angiogram was done with DES to left SFA, only 1 vessel runoff via severely diseased peroneal artery, unable to intervene.   He has severe pain in his right foot and cannot stand on it, pain is worse during HD.  He has not been walking, generally in bed with transfers to wheelchair.  MAP stable at 77 today.  Not short of breath at rest. Left great toe has demarcated with distal gangrene. No nausea/vomiting. He just started PT yesterday.   Reports taking Coumadin as prescribed and adherence to anticoagulation based dietary restrictions. Denies BRBPR or melena. No dark urine or hematuria.   Labs (11/18): hgb 10.4 => 8.7, LDH 213 Labs (08/04/17): hgb 9 Labs (1/19): HgbA1c 6.1 Labs (2/19): hgb 13.2 Labs (3/19): hgb 11.1 Labs (7/19): hgb 9.6 Labs (10/19: hgb 11.8 Labs (12/19): LDH 427 => 237, hgb 7.7  PMH: 1. Chronic systolic CHF: Ischemic cardiomyopathy. St Jude ICD.  - Echo (11/17) with EF 25-30%, moderate diastolic dysfunction, mild MR, mildly dilated RV with moderately decreased systolic function, peak RV-RA gradient 48 mmHg.  - Echo (3/18) with EF 25-30%, wall motion abnormalities, normal RV size and systolic function.  - Admission 3/18 with cardiogenic shock and AKI, required milrinone gtt.  - Echo (8/18) with EF 20-25%, mild LVH, moderate LV dilation, mild MR.  - CPX (8/18) with peak VO2 12.4,  VE/VCO2 slope 35, RER 1.05 => severe HR limitation.  - Heartmate 3 LVAD 10/18.  - Pump thrombosis in setting of HM3 outflow graft kink.  Pump exchange 6/19 for HM3.  2. CAD: Anterior MI 2005, had bifurcation stenting LAD/diagonal. Repeat cath 2010 with patent stents.  - NSTEMI 3/18: LHC with 99% ulcerated lesion proximal RCA, 95% mid LAD => PCI with DES to proximal RCA and mid LAD.  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  - Lower extremity venous dopplers (8/18): chronic DVT - V/Q scan (8/18): Normal, no PE.  4. Crohns disease: History of colectomy.  5. GERD 6. Diabetic gastroparesis: Admission 11/18  with abdominal pain, nausea/vomiting.  - [facility] workup 2019: Gastric emptying study near normal.  Negative breath test (no evidence for small bowel bacterial overgrowth).  Electrogastrogram showed poor gastric accomodation/capacity, "bradygastria."  7. HTN 8. Hyperlipidemia 9. Type II diabetes  10. ESRD: Post-op LVAD surgery.  He has had failed AV fistula and AV graft, now dialyzing via catheter . 11. Carotid dopplers (8/18): mild bilateral stenosis.  12. PFTs (8/18): Mild restriction (no IDL on CT chest).  13. Lung nodules: CT chest 8/18 with small bilateral nodules.  14. PAD: ABIs with moderate bilateral disease in 8/18.  - Peripheral angiogram 03/18/19 showed 80% right SFA stenosis and occluded right PT. He had stenting of SFA and PTCA of PT with restoration of flow. However, he still needed amputation right 4th toe on 03/21/19 with intractable pain.  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  - Peripheral angiogram left leg: DES to left SFA, only 1 vessel runoff via severely diseased peroneal artery, unable to intervene. 15. Atrial flutter: In 11/18 associated with nausea/vomiting from gastroparesis.   Current Outpatient Medications  Medication Sig Dispense Refill  . acetaminophen (TYLENOL) 325 MG tablet Take 2 tablets (650 mg total) by mouth every 4 (four) hours as needed for headache or mild pain.     [name] aspirin EC 81 MG tablet Take 1 tablet (81 mg total) by mouth daily. 90 tablet 3  . atorvastatin (LIPITOR) 40 MG tablet Take 1 tablet (40 mg total) by mouth daily at 6 PM. 30 tablet 6  . calcitRIOL (ROCALTROL) 0.5 MCG capsule Take 1 capsule (0.5 mcg total) by mouth every Monday, Wednesday, and Friday. 15 capsule 5  . citalopram (CELEXA) 20 MG tablet Take 1 tablet (20 mg total) by mouth daily. 30 tablet 6  . docusate sodium (COLACE) 100 MG capsule Take 1 capsule (100 mg total) by mouth daily as needed for mild constipation. 10 capsule 0  . dronabinol (MARINOL) 2.5 MG capsule Take 1 capsule (2.5 mg total) by mouth 2 (two) times daily before lunch and supper. 60 capsule 5  . erythromycin (E-MYCIN) 250 MG tablet Take 1 tablet (250 mg total) by mouth 3 (three) times daily. 90 tablet 4  . gabapentin (NEURONTIN) 300 MG capsule Take 1 capsule (300 mg total) by mouth 2 (two) times daily. 60 capsule 2  . hydrALAZINE (APRESOLINE) 25 MG tablet Take 1 tablet (25 mg total) by mouth 3 (three) times daily. Give ONLY on Sunday, Tuesday, Thursday, Saturday (non-HD days) 50 tablet 6  . insulin glargine (LANTUS) 100 UNIT/ML injection Inject 0.1 mLs (10 Units total) into the skin daily. (Patient taking differently: Inject 10 Units into the skin at bedtime. ) 10 mL 5  . LORazepam (ATIVAN) 0.5 MG tablet Take 1 tablet (0.5 mg total) by mouth every 12 (twelve) hours as needed (nausea). 60 tablet 2  . metoCLOPramide (REGLAN) 10 MG tablet Take 1 tablet (10 mg total) by mouth 3 (three) times daily. (Patient taking differently: Take 10 mg by mouth 3 (three) times daily with meals. ) 90 tablet 6  . midodrine (PROAMATINE) 5 MG tablet Take 1 tablet (5 mg total) by mouth daily. Take 1 tablet before dialysis treatment (Patient taking differently: Take 5 mg by mouth every Monday, Wednesday, and Friday. Take 1 tablet before dialysis treatment) 30 tablet 6  . multivitamin (RENA-VIT) TABS tablet Take 1 tablet by mouth daily.    [name]  NOVOLOG 100 UNIT/ML injection INJECT 7 UNITS INTO THE SKIN DAILY WITH SUPPER. (Patient taking differently: Inject  7 Units into the skin daily with supper. ) 10 mL 3  . pantoprazole (PROTONIX) 40 MG tablet Take 1 tablet (40 mg total) by mouth 2 (two) times daily before lunch and supper. 60 tablet 11  . traMADol (ULTRAM) 50 MG tablet Take 1 tablet (50 mg total) by mouth every 6 (six) hours as needed for moderate pain. 30 tablet 0  . warfarin (COUMADIN) 5 MG tablet TAKE AS FOLLOWS: MON-WED-FRI 1&1/2 TABLETS DAILY. ON ALL OTHER DAYS 1 TABLET (Patient taking differently: Take 5 mg by mouth daily at 6 PM. ) 50 tablet 6  . oxyCODONE (ROXICODONE) 5 MG immediate release tablet Take 1 tablet (5 mg total) by mouth 2 (two) times daily as needed for severe pain. Take 1-2 tablets every 4-6 hours for severe pain. 30 tablet 0  . promethazine (PHENERGAN) 12.5 MG tablet Take 1 tablet (12.5 mg total) by mouth every 6 (six) hours as needed for nausea or vomiting. (Patient not taking: Reported on 05/21/2019) 30 tablet 3   No current facility-administered medications for this encounter.     Metformin and related   Review of systems complete and found to be negative unless listed in HPI.    BP (!) 100/0 Comment: Doppler modified systolic  Pulse 69   Temp 98.4 F (36.9 C)   Ht 5' 8"  (1.727 m)   Wt 88.5 kg (195 lb)   SpO2 98%   BMI 29.65 kg/m  MAP 77  VAD interrogation & Equipment Management: See nurse's note above.   General: Well appearing this am. NAD.  HEENT: Normal. Neck: Supple, JVP 7-8 cm. Carotids OK.  Cardiac:  Mechanical heart sounds with LVAD hum present.  DP pulse on right dopplerable, not on left.  Lungs:  CTAB, normal effort.  Abdomen:  NT, ND, no HSM. No bruits or masses. +BS  LVAD exit site: Well-healed and incorporated. Dressing dry and intact. No erythema or drainage. Stabilization device present and accurately applied. Driveline dressing changed daily per sterile technique. Extremities:   Warm and dry. No edema.  Gangrenous left great toe.  S/p right transmetatarsal amputation.  Neuro:  Alert & oriented x 3. Cranial nerves grossly intact. Moves all 4 extremities w/o difficulty. Affect pleasant    ASSESSMENT AND PLAN: 1. PAD/ischemic feet: S/p R transmetatarsal amputation on 8/25.  Left great toe gangrenous.  S/p left leg angiogram with SFA PCI.  There was 1 vessel runoff via left peroneal with severe multilevel stenosis => unable to intervene on peroneal.  - I think that he is going to need to get in with Dr. Donzetta Matters soon.  He will need amputation of the left great toe.  2. Chronic systolic CHF: Ischemic cardiomyopathy. St Jude ICD. NSTEMI in March 2018 with DES to LAD and RCA, complicated by cardiogenic shock, low output requiring milrinone. Echo 8/18 with EF 20-25%, moderate MR. CPX (8/18) with severe functional impairment due to HF. He is s/p HM3 LVAD placement. He had pump thrombosis 6/19 due to [facility] outflow graft kinking. He had pump exchange for another HM3.   - Volume managed at dialysis, MWF.   - Continue ASA 81and warfarin with INR goal 2-2.5.   - He has been referred to [facility] for evaluation for heart/kidney transplantand recently saw Dr Mosetta Pigeon, but he is not candidate for transplantnowgiven extensive PAD. - Continue hydralazine 25 mg tid on non-HD days.  3. ESRD: HD on M-W-F with midodrine pre-HD.  4.CAD: NSTEMI in 3/18. LHC with 99% ulcerated lesion proximal RCA with  left to right collaterals, 95% mid LAD stenosis after mid LAD stent. s/p PCI to RCA and LAD on 11/25/16.No chest pain.  - Continue statin and ASA. 5. h/o DVTs: Factor V Leiden heterozygote. -Anticoagulated. 6. Diabetic gastroparesis: He has seen gastroparesis specialist at [facility]. Surprisingly, gastric emptying study was normal. However, electrogastrogram showed poor gastric accomodation/capacity. Therefore, small frequent meals recommended. - Continue reglan 10 mg tid/ac and Marinol.  -  Continue Protonix bid.  - He will continue erythromycinindefinitely.  - Limit narcotic use as much as possible.  7. MBO:BOFPULGS hydralazine on non-HD days.  8. Foot pain: Very debilitating and unable to bear weight on right foot.   - Continue to emphasize PT.   Sachit Gilman 05/21/2019

## 2019-05-21 NOTE — Telephone Encounter (Signed)
Called [facility]ist per Dr. Aundra Dubin asking if Dr. Donzetta Matters can see this patient sooner than scheduled 05/31/19 appointment. Appt re-scheduled to this Thursday, 05/23/19 at 2:45 with NP. [facility] office will contact patient with new appt time/date and any further instructions.  Zada Girt RN, [place] Coordinator [phone]

## 2019-05-21 NOTE — Progress Notes (Signed)
LVAD INR 

## 2019-05-21 NOTE — Patient Instructions (Addendum)
1. Change coumadin dose to 5 mg daily. Re-check home INR Friday. 2. Rx given for 30 day supply of Oxy 3. Will refer you to pain clinic 4. Will call Dr. Claretha Cooper office and ask for sonner f/u appt for you 5. Return to [facility] in 6 weeks.

## 2019-05-21 NOTE — Telephone Encounter (Signed)
Called the [facility] at [facility] and approved order

## 2019-05-22 ENCOUNTER — Encounter (HOSPITAL_COMMUNITY): Payer: Self-pay | Admitting: *Deleted

## 2019-05-23 ENCOUNTER — Other Ambulatory Visit: Payer: Self-pay | Admitting: *Deleted

## 2019-05-23 ENCOUNTER — Telehealth: Payer: Self-pay | Admitting: *Deleted

## 2019-05-23 ENCOUNTER — Ambulatory Visit (INDEPENDENT_AMBULATORY_CARE_PROVIDER_SITE_OTHER): Payer: Self-pay | Admitting: Physician Assistant

## 2019-05-23 ENCOUNTER — Other Ambulatory Visit: Payer: Self-pay

## 2019-05-23 VITALS — BP 118/74 | HR 70 | Temp 98.0°F | Resp 14 | Ht 68.0 in | Wt 195.0 lb

## 2019-05-23 DIAGNOSIS — I739 Peripheral vascular disease, unspecified: Secondary | ICD-10-CM

## 2019-05-23 MED ORDER — INSULIN GLARGINE 100 UNIT/ML ~~LOC~~ SOLN: 10.0000 [IU] | Freq: Every day | SUBCUTANEOUS | 5 refills | Status: DC

## 2019-05-23 NOTE — Progress Notes (Signed)
POST OPERATIVE OFFICE NOTE    CC:  F/u for surgery  HPI:  This is a 53 y.o. male with hx of LVAD, ESRD, DM, CAD, HLD who is s/p right TMA on 04/30/2019 by Dr. Donzetta Matters for non healing amp site.  He had previously undergone ray amp of right 4th toes on 03/21/2019 by Dr. Donzetta Matters.   He was seen 05/21/2019 by Dr. Aundra Dubin and was having difficulty walking due to pain in his right foot and is unable to put any weight on this foot.  He had an audible doppler signal per note.    Pt reports his left great toe is darker and note reports blackening of left great toe.  Pt needs total assist to stand as he is unable to use crutches and is w/c bound.   He states that he has more heel pain on the left and cannot bear weight on this.   He recently underwent aortogram by Dr. Carlis Abbott on 8/27: 1.  Ultrasound-guided access of the right common femoral artery 2.  Left lower extremity arteriogram with selection of third order branches 3.  Left SFA stent (6 mm x 60 mm drug-coated Eluvia stent postdilated with a 5 mm Mustang) 4.  Mynx closure of the right common femoral artery 5.  52 minutes of monitored moderate conscious sedation time Findings as follows: Aortogram was deferred since it was performed earlier in the week.  Dedicated left lower extremity arteriogram showed approximate 50% stenosis in the mid SFA.  Patient only had single runoff via the peroneal artery that appeared inline down to the foot but was heavily diseased throughout multiple segments with heavily calcified multifocal high-grade stenosis and/or short occlusions.  Ultimately cannulated the left peroneal with a V 18 and attempted to get down to the ankle.  Unfortunately in the mid calf I could not get my wire down the true lumen of the vessel and was in a subintimal plane and also had a small perforation.  I tried multiple wires and catheters including an 014 and still could not get down the true lumen of the artery distal to this lesion.  Possible short  focal occlusion.  Ultimately elected to stop given single-vessel runoff.  The left SFA was then treated with a 6 mm x 60 mm drug-coated stent and postdilated with a 5 mm balloon.  He had preserved single-vessel peroneal runoff at the end of the case.  Aortogram on 03/18/2019: 1.  Ultrasound-guided cannulation left common femoral artery 2.  Aortogram with bilateral lower extremity runoff 3.  Stent of right SFA with 6 x 60 mm elluvia 4.  Ultrasound-guided cannulation right posterior tibial artery 5.  Balloon angioplasty right posterior tibial artery with 3 and 2.5 mm balloons 6.  Moderate sedation with fentanyl and Versed for 66 minutes 7.  Maximize closure left common femoral artery Findings: His aorta and iliac segments are free of any flow-limiting stenosis.  The right side is the site of interest.  There is approximately 80% SFA stenosis distally which is resolved 0% after stenting.  His peroneal artery runs off to the mid calf.  His posterior tibial artery occludes after several centimeters proximally and reconstitutes distally in the calf.  After balloon angioplasty there is no flow-limiting stenosis.  Does not appear to be any flow-limiting dissection although there is may be minimal dissection distally in the artery.  Does have much improved flow through the posterior tibial artery at completion.  The left side SFA has a similar stenosis as  the right.  Appears peroneal artery is the dominant runoff although could not see if it runs all the way to the foot.   Allergies  Allergen Reactions  . Metformin And Related Diarrhea    Current Outpatient Medications  Medication Sig Dispense Refill  . acetaminophen (TYLENOL) 325 MG tablet Take 2 tablets (650 mg total) by mouth every 4 (four) hours as needed for headache or mild pain.    [name] aspirin EC 81 MG tablet Take 1 tablet (81 mg total) by mouth daily. 90 tablet 3  . atorvastatin (LIPITOR) 40 MG tablet Take 1 tablet (40 mg total) by mouth daily at 6  PM. 30 tablet 6  . calcitRIOL (ROCALTROL) 0.5 MCG capsule Take 1 capsule (0.5 mcg total) by mouth every Monday, Wednesday, and Friday. 15 capsule 5  . citalopram (CELEXA) 20 MG tablet Take 1 tablet (20 mg total) by mouth daily. 30 tablet 6  . docusate sodium (COLACE) 100 MG capsule Take 1 capsule (100 mg total) by mouth daily as needed for mild constipation. 10 capsule 0  . dronabinol (MARINOL) 2.5 MG capsule Take 1 capsule (2.5 mg total) by mouth 2 (two) times daily before lunch and supper. 60 capsule 5  . erythromycin (E-MYCIN) 250 MG tablet Take 1 tablet (250 mg total) by mouth 3 (three) times daily. 90 tablet 4  . gabapentin (NEURONTIN) 300 MG capsule Take 1 capsule (300 mg total) by mouth 2 (two) times daily. 60 capsule 2  . hydrALAZINE (APRESOLINE) 25 MG tablet Take 1 tablet (25 mg total) by mouth 3 (three) times daily. Give ONLY on Sunday, Tuesday, Thursday, Saturday (non-HD days) 50 tablet 6  . insulin glargine (LANTUS) 100 UNIT/ML injection Inject 0.1 mLs (10 Units total) into the skin daily. 10 mL 5  . LORazepam (ATIVAN) 0.5 MG tablet Take 1 tablet (0.5 mg total) by mouth every 12 (twelve) hours as needed (nausea). 60 tablet 2  . metoCLOPramide (REGLAN) 10 MG tablet Take 1 tablet (10 mg total) by mouth 3 (three) times daily. (Patient taking differently: Take 10 mg by mouth 3 (three) times daily with meals. ) 90 tablet 6  . midodrine (PROAMATINE) 5 MG tablet Take 1 tablet (5 mg total) by mouth daily. Take 1 tablet before dialysis treatment (Patient taking differently: Take 5 mg by mouth every Monday, Wednesday, and Friday. Take 1 tablet before dialysis treatment) 30 tablet 6  . multivitamin (RENA-VIT) TABS tablet Take 1 tablet by mouth daily.    [name] NOVOLOG 100 UNIT/ML injection INJECT 7 UNITS INTO THE SKIN DAILY WITH SUPPER. (Patient taking differently: Inject 7 Units into the skin daily with supper. ) 10 mL 3  . oxyCODONE (ROXICODONE) 5 MG immediate release tablet Take 1 tablet (5 mg total) by  mouth 2 (two) times daily as needed for severe pain. Take 1-2 tablets every 4-6 hours for severe pain. 30 tablet 0  . pantoprazole (PROTONIX) 40 MG tablet Take 1 tablet (40 mg total) by mouth 2 (two) times daily before lunch and supper. 60 tablet 11  . promethazine (PHENERGAN) 12.5 MG tablet Take 1 tablet (12.5 mg total) by mouth every 6 (six) hours as needed for nausea or vomiting. (Patient not taking: Reported on 05/21/2019) 30 tablet 3  . traMADol (ULTRAM) 50 MG tablet Take 1 tablet (50 mg total) by mouth every 6 (six) hours as needed for moderate pain. 30 tablet 0  . warfarin (COUMADIN) 5 MG tablet TAKE AS FOLLOWS: MON-WED-FRI 1&1/2 TABLETS DAILY. ON ALL OTHER DAYS  1 TABLET (Patient taking differently: Take 5 mg by mouth daily at 6 PM. ) 50 tablet 6   No current facility-administered medications for this visit.      ROS:  See HPI  Physical Exam:  Today's Vitals   05/23/19 1457  BP: 118/74  Pulse: 70  Resp: 14  Temp: 98 F (36.7 C)  TempSrc: Temporal  SpO2: 98%  Weight: 195 lb (88.5 kg)  Height: 5' 8"  (1.727 m)  PainSc: 6    Body mass index is 29.65 kg/m.   Incision:      Extremities:          Assessment/Plan:  This is a 53 y.o. male who is s/p: Right TMA by Dr. Donzetta Matters on 04/30/2019 now with gangrenous left great toe here for evaluation.    -pt does have a peroneal doppler signal present on the left.  Given this, will proceed with left great toe amputation.  Will coordinate with heart failure team for admission to hospital for coumadin to heparin bridge next week for left great toe amputation.   -right TMA looks ok.  Will wait to remove sutures and give a little more time.  Hopefully will heal.  He does have heel pain with weight bearing.  Discussed with Dr. Donzetta Matters and then with pt that if this becomes unbearable, he would need a more proximal amputation.  Pt understands.  Pt with tape on right foot for dressing.  Would prefer if pt not use tape to prevent tape blister on  foot.  Please use gauze and gentle ace to keep bandage in place and change daily.  If pt develops tape blister, he could be at risk for another non healing wound.  Discussed with pt and his friend and they both expressed understanding.    Leontine Locket, PA-C [facility] [phone]  Clinic MD:  [facility]

## 2019-05-23 NOTE — Telephone Encounter (Signed)
-----   Message from Willy Eddy, RN sent at 05/23/2019  5:37 PM EDT ----- Regarding: FW: Need your assistance again  ----- Message ----- From: Willy Eddy, RN Sent: 05/23/2019   3:45 PM EDT To: Larey Dresser, MD, Amy Estrella Deeds, NP, # Subject: Need your assistance again                     Sadly, Mr. Stailey needs to go back to surgery for amputation of Left great toe. He will need to hold Coumadin for 3 days pre-op. Surgery is scheduled for Thursday Sept. 24th with Dr. Trula Slade. I am requesting your assistance to admit and manage him for this surgery. Please contact me if there is anything I can do to help with this. Again I really appreciate your help. Becky

## 2019-05-23 NOTE — Progress Notes (Signed)
Spoke with patient and instructed to hold Coumadin for 3 days prior to surgery. Will be contacted by Dr. Aundra Dubin Heart Failure clinic for anticoagulation management prior to surgery. To follow their instructions for admission to hospital pre-op. Surgery scheduled for 05/30/2019. Verbalized understanding. Message sent to [facility] and Dr. Aundra Dubin for assistance/admission pre-op anticoagulation management.

## 2019-05-23 NOTE — Telephone Encounter (Signed)
Mallory from [facility] for OT verbal orders as follows:  1 time(s) weekly for 4 week(s)  You can leave verbal orders on confidential voicemail.  Christen Bame, CMA

## 2019-05-23 NOTE — H&P (View-Only) (Signed)
POST OPERATIVE OFFICE NOTE    CC:  F/u for surgery  HPI:  This is a 53 y.o. male with hx of LVAD, ESRD, DM, CAD, HLD who is s/p right TMA on 04/30/2019 by Dr. Donzetta Matters for non healing amp site.  He had previously undergone ray amp of right 4th toes on 03/21/2019 by Dr. Donzetta Matters.   He was seen 05/21/2019 by Dr. Aundra Dubin and was having difficulty walking due to pain in his right foot and is unable to put any weight on this foot.  He had an audible doppler signal per note.    Pt reports his left great toe is darker and note reports blackening of left great toe.  Pt needs total assist to stand as he is unable to use crutches and is w/c bound.   He states that he has more heel pain on the left and cannot bear weight on this.   He recently underwent aortogram by Dr. Carlis Abbott on 8/27: 1.  Ultrasound-guided access of the right common femoral artery 2.  Left lower extremity arteriogram with selection of third order branches 3.  Left SFA stent (6 mm x 60 mm drug-coated Eluvia stent postdilated with a 5 mm Mustang) 4.  Mynx closure of the right common femoral artery 5.  52 minutes of monitored moderate conscious sedation time Findings as follows: Aortogram was deferred since it was performed earlier in the week.  Dedicated left lower extremity arteriogram showed approximate 50% stenosis in the mid SFA.  Patient only had single runoff via the peroneal artery that appeared inline down to the foot but was heavily diseased throughout multiple segments with heavily calcified multifocal high-grade stenosis and/or short occlusions.  Ultimately cannulated the left peroneal with a V 18 and attempted to get down to the ankle.  Unfortunately in the mid calf I could not get my wire down the true lumen of the vessel and was in a subintimal plane and also had a small perforation.  I tried multiple wires and catheters including an 014 and still could not get down the true lumen of the artery distal to this lesion.  Possible short  focal occlusion.  Ultimately elected to stop given single-vessel runoff.  The left SFA was then treated with a 6 mm x 60 mm drug-coated stent and postdilated with a 5 mm balloon.  He had preserved single-vessel peroneal runoff at the end of the case.  Aortogram on 03/18/2019: 1.  Ultrasound-guided cannulation left common femoral artery 2.  Aortogram with bilateral lower extremity runoff 3.  Stent of right SFA with 6 x 60 mm elluvia 4.  Ultrasound-guided cannulation right posterior tibial artery 5.  Balloon angioplasty right posterior tibial artery with 3 and 2.5 mm balloons 6.  Moderate sedation with fentanyl and Versed for 66 minutes 7.  Maximize closure left common femoral artery Findings: His aorta and iliac segments are free of any flow-limiting stenosis.  The right side is the site of interest.  There is approximately 80% SFA stenosis distally which is resolved 0% after stenting.  His peroneal artery runs off to the mid calf.  His posterior tibial artery occludes after several centimeters proximally and reconstitutes distally in the calf.  After balloon angioplasty there is no flow-limiting stenosis.  Does not appear to be any flow-limiting dissection although there is may be minimal dissection distally in the artery.  Does have much improved flow through the posterior tibial artery at completion.  The left side SFA has a similar stenosis as  the right.  Appears peroneal artery is the dominant runoff although could not see if it runs all the way to the foot.   Allergies  Allergen Reactions  . Metformin And Related Diarrhea    Current Outpatient Medications  Medication Sig Dispense Refill  . acetaminophen (TYLENOL) 325 MG tablet Take 2 tablets (650 mg total) by mouth every 4 (four) hours as needed for headache or mild pain.    [name] aspirin EC 81 MG tablet Take 1 tablet (81 mg total) by mouth daily. 90 tablet 3  . atorvastatin (LIPITOR) 40 MG tablet Take 1 tablet (40 mg total) by mouth daily at 6  PM. 30 tablet 6  . calcitRIOL (ROCALTROL) 0.5 MCG capsule Take 1 capsule (0.5 mcg total) by mouth every Monday, Wednesday, and Friday. 15 capsule 5  . citalopram (CELEXA) 20 MG tablet Take 1 tablet (20 mg total) by mouth daily. 30 tablet 6  . docusate sodium (COLACE) 100 MG capsule Take 1 capsule (100 mg total) by mouth daily as needed for mild constipation. 10 capsule 0  . dronabinol (MARINOL) 2.5 MG capsule Take 1 capsule (2.5 mg total) by mouth 2 (two) times daily before lunch and supper. 60 capsule 5  . erythromycin (E-MYCIN) 250 MG tablet Take 1 tablet (250 mg total) by mouth 3 (three) times daily. 90 tablet 4  . gabapentin (NEURONTIN) 300 MG capsule Take 1 capsule (300 mg total) by mouth 2 (two) times daily. 60 capsule 2  . hydrALAZINE (APRESOLINE) 25 MG tablet Take 1 tablet (25 mg total) by mouth 3 (three) times daily. Give ONLY on Sunday, Tuesday, Thursday, Saturday (non-HD days) 50 tablet 6  . insulin glargine (LANTUS) 100 UNIT/ML injection Inject 0.1 mLs (10 Units total) into the skin daily. 10 mL 5  . LORazepam (ATIVAN) 0.5 MG tablet Take 1 tablet (0.5 mg total) by mouth every 12 (twelve) hours as needed (nausea). 60 tablet 2  . metoCLOPramide (REGLAN) 10 MG tablet Take 1 tablet (10 mg total) by mouth 3 (three) times daily. (Patient taking differently: Take 10 mg by mouth 3 (three) times daily with meals. ) 90 tablet 6  . midodrine (PROAMATINE) 5 MG tablet Take 1 tablet (5 mg total) by mouth daily. Take 1 tablet before dialysis treatment (Patient taking differently: Take 5 mg by mouth every Monday, Wednesday, and Friday. Take 1 tablet before dialysis treatment) 30 tablet 6  . multivitamin (RENA-VIT) TABS tablet Take 1 tablet by mouth daily.    [name] NOVOLOG 100 UNIT/ML injection INJECT 7 UNITS INTO THE SKIN DAILY WITH SUPPER. (Patient taking differently: Inject 7 Units into the skin daily with supper. ) 10 mL 3  . oxyCODONE (ROXICODONE) 5 MG immediate release tablet Take 1 tablet (5 mg total) by  mouth 2 (two) times daily as needed for severe pain. Take 1-2 tablets every 4-6 hours for severe pain. 30 tablet 0  . pantoprazole (PROTONIX) 40 MG tablet Take 1 tablet (40 mg total) by mouth 2 (two) times daily before lunch and supper. 60 tablet 11  . promethazine (PHENERGAN) 12.5 MG tablet Take 1 tablet (12.5 mg total) by mouth every 6 (six) hours as needed for nausea or vomiting. (Patient not taking: Reported on 05/21/2019) 30 tablet 3  . traMADol (ULTRAM) 50 MG tablet Take 1 tablet (50 mg total) by mouth every 6 (six) hours as needed for moderate pain. 30 tablet 0  . warfarin (COUMADIN) 5 MG tablet TAKE AS FOLLOWS: MON-WED-FRI 1&1/2 TABLETS DAILY. ON ALL OTHER DAYS  1 TABLET (Patient taking differently: Take 5 mg by mouth daily at 6 PM. ) 50 tablet 6   No current facility-administered medications for this visit.      ROS:  See HPI  Physical Exam:  Today's Vitals   05/23/19 1457  BP: 118/74  Pulse: 70  Resp: 14  Temp: 98 F (36.7 C)  TempSrc: Temporal  SpO2: 98%  Weight: 195 lb (88.5 kg)  Height: 5' 8"  (1.727 m)  PainSc: 6    Body mass index is 29.65 kg/m.   Incision:      Extremities:          Assessment/Plan:  This is a 53 y.o. male who is s/p: Right TMA by Dr. Donzetta Matters on 04/30/2019 now with gangrenous left great toe here for evaluation.    -pt does have a peroneal doppler signal present on the left.  Given this, will proceed with left great toe amputation.  Will coordinate with heart failure team for admission to hospital for coumadin to heparin bridge next week for left great toe amputation.   -right TMA looks ok.  Will wait to remove sutures and give a little more time.  Hopefully will heal.  He does have heel pain with weight bearing.  Discussed with Dr. Donzetta Matters and then with pt that if this becomes unbearable, he would need a more proximal amputation.  Pt understands.  Pt with tape on right foot for dressing.  Would prefer if pt not use tape to prevent tape blister on  foot.  Please use gauze and gentle ace to keep bandage in place and change daily.  If pt develops tape blister, he could be at risk for another non healing wound.  Discussed with pt and his friend and they both expressed understanding.    Leontine Locket, PA-C [facility] [phone]  Clinic MD:  [facility]

## 2019-05-24 ENCOUNTER — Ambulatory Visit (HOSPITAL_COMMUNITY): Payer: Self-pay | Admitting: Pharmacist

## 2019-05-24 ENCOUNTER — Telehealth: Payer: Self-pay

## 2019-05-24 ENCOUNTER — Other Ambulatory Visit (HOSPITAL_COMMUNITY): Payer: Self-pay | Admitting: Unknown Physician Specialty

## 2019-05-24 DIAGNOSIS — Z5181 Encounter for therapeutic drug level monitoring: Secondary | ICD-10-CM

## 2019-05-24 DIAGNOSIS — Z95811 Presence of heart assist device: Secondary | ICD-10-CM

## 2019-05-24 LAB — POCT INR: INR: 3.9 — AB (ref 2.0–3.0)

## 2019-05-24 NOTE — Telephone Encounter (Signed)
I have signed the physical forms and placed in "to be faxed" pile.  Red team, can we please try to schedule this patient for a check in visit to review his meds and labs? I'd prefer in person but if he has difficulty with transportation we can try a virtual visit.  Thanks!

## 2019-05-24 NOTE — Telephone Encounter (Signed)
Patient's surgery has been changed from Thursday, Sept 24th to Wednesday, Sept 23rd. Heart Failure clinic was notified and staff message sent to Dr. Aundra Dubin and [place]. Patient was notified of change as well.

## 2019-05-24 NOTE — Progress Notes (Signed)
LVAD INR 

## 2019-05-27 ENCOUNTER — Inpatient Hospital Stay (HOSPITAL_COMMUNITY): Admission: RE | Admit: 2019-05-27 | Payer: BC Managed Care – PPO | Source: Ambulatory Visit

## 2019-05-27 ENCOUNTER — Other Ambulatory Visit: Payer: Self-pay | Admitting: *Deleted

## 2019-05-27 ENCOUNTER — Encounter: Payer: Self-pay | Admitting: *Deleted

## 2019-05-27 NOTE — Progress Notes (Signed)
Follow-up Message sent to Heart Failure Clinic. To Dr. Aundra Dubin, Zada Girt and Amy Clegg for anticoagulation management for 05/29/2019 surgery. I also left a message for patient to call me back and let me know if he has been contacted by this team with  instructions.

## 2019-05-27 NOTE — Progress Notes (Signed)
I spoke to Mr Fiumara, he asked, "I was supposed to go by and have a Covid test today, wasn't I. " I said yes, you can go get it tomorrow, your surgery is Wednesday.  Mr Rodriquez said , "I go to the hospital to be admitted tomorrow."   I said that is fine, go by the testing  Center and then go home and wait for your call.  Mr Rosencrans said that he would.

## 2019-05-28 ENCOUNTER — Encounter (HOSPITAL_COMMUNITY): Payer: Self-pay | Admitting: *Deleted

## 2019-05-28 ENCOUNTER — Other Ambulatory Visit (HOSPITAL_COMMUNITY)
Admission: RE | Admit: 2019-05-28 | Discharge: 2019-05-28 | Disposition: A | Discharge status: Home / Self Care | Payer: BC Managed Care – PPO | Source: Ambulatory Visit | Attending: Surgery | Admitting: Surgery

## 2019-05-28 ENCOUNTER — Other Ambulatory Visit: Payer: Self-pay

## 2019-05-28 ENCOUNTER — Inpatient Hospital Stay (HOSPITAL_COMMUNITY): Payer: BC Managed Care – PPO

## 2019-05-28 ENCOUNTER — Inpatient Hospital Stay: Admission: AD | Admit: 2019-05-28 | Payer: BC Managed Care – PPO | Source: Ambulatory Visit | Admitting: Cardiology

## 2019-05-28 ENCOUNTER — Inpatient Hospital Stay (HOSPITAL_COMMUNITY)
Admission: RE | Admit: 2019-05-28 | Discharge: 2019-06-16 | DRG: 239 | Disposition: A | Discharge status: Skilled Nursing Facility | Payer: BC Managed Care – PPO | Attending: Cardiology | Admitting: Cardiology

## 2019-05-28 DIAGNOSIS — I132 Hypertensive heart and chronic kidney disease with heart failure and with stage 5 chronic kidney disease, or end stage renal disease: Secondary | ICD-10-CM | POA: Diagnosis present

## 2019-05-28 DIAGNOSIS — I4891 Unspecified atrial fibrillation: Secondary | ICD-10-CM | POA: Diagnosis present

## 2019-05-28 DIAGNOSIS — D6851 Activated protein C resistance: Secondary | ICD-10-CM | POA: Diagnosis present

## 2019-05-28 DIAGNOSIS — D72829 Elevated white blood cell count, unspecified: Secondary | ICD-10-CM | POA: Diagnosis not present

## 2019-05-28 DIAGNOSIS — D62 Acute posthemorrhagic anemia: Secondary | ICD-10-CM | POA: Diagnosis not present

## 2019-05-28 DIAGNOSIS — N2581 Secondary hyperparathyroidism of renal origin: Secondary | ICD-10-CM | POA: Diagnosis present

## 2019-05-28 DIAGNOSIS — Z86010 Personal history of colonic polyps: Secondary | ICD-10-CM

## 2019-05-28 DIAGNOSIS — E785 Hyperlipidemia, unspecified: Secondary | ICD-10-CM | POA: Diagnosis present

## 2019-05-28 DIAGNOSIS — G4710 Hypersomnia, unspecified: Secondary | ICD-10-CM | POA: Diagnosis present

## 2019-05-28 DIAGNOSIS — I2510 Atherosclerotic heart disease of native coronary artery without angina pectoris: Secondary | ICD-10-CM | POA: Diagnosis present

## 2019-05-28 DIAGNOSIS — E8889 Other specified metabolic disorders: Secondary | ICD-10-CM | POA: Diagnosis present

## 2019-05-28 DIAGNOSIS — I788 Other diseases of capillaries: Secondary | ICD-10-CM | POA: Diagnosis present

## 2019-05-28 DIAGNOSIS — R262 Difficulty in walking, not elsewhere classified: Secondary | ICD-10-CM | POA: Diagnosis present

## 2019-05-28 DIAGNOSIS — N186 End stage renal disease: Secondary | ICD-10-CM | POA: Diagnosis present

## 2019-05-28 DIAGNOSIS — Z7982 Long term (current) use of aspirin: Secondary | ICD-10-CM

## 2019-05-28 DIAGNOSIS — I480 Paroxysmal atrial fibrillation: Secondary | ICD-10-CM | POA: Diagnosis present

## 2019-05-28 DIAGNOSIS — G47419 Narcolepsy without cataplexy: Secondary | ICD-10-CM | POA: Diagnosis present

## 2019-05-28 DIAGNOSIS — I70209 Unspecified atherosclerosis of native arteries of extremities, unspecified extremity: Secondary | ICD-10-CM | POA: Diagnosis present

## 2019-05-28 DIAGNOSIS — Z7901 Long term (current) use of anticoagulants: Secondary | ICD-10-CM

## 2019-05-28 DIAGNOSIS — Z992 Dependence on renal dialysis: Secondary | ICD-10-CM

## 2019-05-28 DIAGNOSIS — Z79891 Long term (current) use of opiate analgesic: Secondary | ICD-10-CM

## 2019-05-28 DIAGNOSIS — E7800 Pure hypercholesterolemia, unspecified: Secondary | ICD-10-CM | POA: Diagnosis present

## 2019-05-28 DIAGNOSIS — Z794 Long term (current) use of insulin: Secondary | ICD-10-CM

## 2019-05-28 DIAGNOSIS — Z833 Family history of diabetes mellitus: Secondary | ICD-10-CM

## 2019-05-28 DIAGNOSIS — Z95810 Presence of automatic (implantable) cardiac defibrillator: Secondary | ICD-10-CM

## 2019-05-28 DIAGNOSIS — I252 Old myocardial infarction: Secondary | ICD-10-CM | POA: Diagnosis not present

## 2019-05-28 DIAGNOSIS — Z95811 Presence of heart assist device: Secondary | ICD-10-CM

## 2019-05-28 DIAGNOSIS — I739 Peripheral vascular disease, unspecified: Secondary | ICD-10-CM | POA: Diagnosis not present

## 2019-05-28 DIAGNOSIS — I5022 Chronic systolic (congestive) heart failure: Secondary | ICD-10-CM | POA: Diagnosis present

## 2019-05-28 DIAGNOSIS — M79671 Pain in right foot: Secondary | ICD-10-CM | POA: Diagnosis present

## 2019-05-28 DIAGNOSIS — Z9861 Coronary angioplasty status: Secondary | ICD-10-CM

## 2019-05-28 DIAGNOSIS — E1122 Type 2 diabetes mellitus with diabetic chronic kidney disease: Secondary | ICD-10-CM | POA: Diagnosis present

## 2019-05-28 DIAGNOSIS — Z9049 Acquired absence of other specified parts of digestive tract: Secondary | ICD-10-CM

## 2019-05-28 DIAGNOSIS — Z8249 Family history of ischemic heart disease and other diseases of the circulatory system: Secondary | ICD-10-CM

## 2019-05-28 DIAGNOSIS — Z20828 Contact with and (suspected) exposure to other viral communicable diseases: Secondary | ICD-10-CM | POA: Diagnosis present

## 2019-05-28 DIAGNOSIS — Z452 Encounter for adjustment and management of vascular access device: Secondary | ICD-10-CM

## 2019-05-28 DIAGNOSIS — M79672 Pain in left foot: Secondary | ICD-10-CM | POA: Diagnosis present

## 2019-05-28 DIAGNOSIS — Z8371 Family history of colonic polyps: Secondary | ICD-10-CM

## 2019-05-28 DIAGNOSIS — I998 Other disorder of circulatory system: Secondary | ICD-10-CM | POA: Diagnosis present

## 2019-05-28 DIAGNOSIS — I255 Ischemic cardiomyopathy: Secondary | ICD-10-CM | POA: Diagnosis present

## 2019-05-28 DIAGNOSIS — K3184 Gastroparesis: Secondary | ICD-10-CM | POA: Diagnosis present

## 2019-05-28 DIAGNOSIS — Z79899 Other long term (current) drug therapy: Secondary | ICD-10-CM

## 2019-05-28 DIAGNOSIS — I96 Gangrene, not elsewhere classified: Secondary | ICD-10-CM | POA: Diagnosis not present

## 2019-05-28 DIAGNOSIS — Z823 Family history of stroke: Secondary | ICD-10-CM

## 2019-05-28 DIAGNOSIS — E1143 Type 2 diabetes mellitus with diabetic autonomic (poly)neuropathy: Secondary | ICD-10-CM | POA: Diagnosis present

## 2019-05-28 DIAGNOSIS — K5090 Crohn's disease, unspecified, without complications: Secondary | ICD-10-CM | POA: Diagnosis present

## 2019-05-28 DIAGNOSIS — K219 Gastro-esophageal reflux disease without esophagitis: Secondary | ICD-10-CM | POA: Diagnosis present

## 2019-05-28 DIAGNOSIS — E1152 Type 2 diabetes mellitus with diabetic peripheral angiopathy with gangrene: Principal | ICD-10-CM | POA: Diagnosis present

## 2019-05-28 DIAGNOSIS — D649 Anemia, unspecified: Secondary | ICD-10-CM | POA: Diagnosis present

## 2019-05-28 LAB — GLUCOSE, CAPILLARY
Glucose-Capillary: 294 mg/dL — ABNORMAL HIGH (ref 70–99)
Glucose-Capillary: 205 mg/dL — ABNORMAL HIGH (ref 70–99)
Glucose-Capillary: 148 mg/dL — ABNORMAL HIGH (ref 70–99)

## 2019-05-28 LAB — CBC WITH DIFFERENTIAL/PLATELET
Abs Immature Granulocytes: 0.19 10*3/uL — ABNORMAL HIGH (ref 0.00–0.07)
Basophils Absolute: 0.1 10*3/uL (ref 0.0–0.1)
Basophils Relative: 0 %
Eosinophils Absolute: 0.4 10*3/uL (ref 0.0–0.5)
Eosinophils Relative: 2 %
HCT: 32 % — ABNORMAL LOW (ref 39.0–52.0)
Hemoglobin: 10.2 g/dL — ABNORMAL LOW (ref 13.0–17.0)
Immature Granulocytes: 1 %
Lymphocytes Relative: 5 %
Lymphs Abs: 1 10*3/uL (ref 0.7–4.0)
MCH: 28 pg (ref 26.0–34.0)
MCHC: 31.9 g/dL (ref 30.0–36.0)
MCV: 87.9 fL (ref 80.0–100.0)
Monocytes Absolute: 1.5 10*3/uL — ABNORMAL HIGH (ref 0.1–1.0)
Monocytes Relative: 8 %
Neutro Abs: 15.5 10*3/uL — ABNORMAL HIGH (ref 1.7–7.7)
Neutrophils Relative %: 84 %
Platelets: 330 10*3/uL (ref 150–400)
RBC: 3.64 MIL/uL — ABNORMAL LOW (ref 4.22–5.81)
RDW: 17.1 % — ABNORMAL HIGH (ref 11.5–15.5)
WBC: 18.6 10*3/uL — ABNORMAL HIGH (ref 4.0–10.5)
nRBC: 0.1 % (ref 0.0–0.2)

## 2019-05-28 LAB — LACTATE DEHYDROGENASE: LDH: 200 U/L — ABNORMAL HIGH (ref 98–192)

## 2019-05-28 LAB — PROTIME-INR
INR: 2.5 — ABNORMAL HIGH (ref 0.8–1.2)
Prothrombin Time: 26.7 seconds — ABNORMAL HIGH (ref 11.4–15.2)

## 2019-05-28 LAB — MRSA PCR SCREENING: MRSA by PCR: NEGATIVE

## 2019-05-28 LAB — SARS CORONAVIRUS 2 (TAT 6-24 HRS): SARS Coronavirus 2: NEGATIVE

## 2019-05-28 MED ORDER — ALTEPLASE 2 MG IJ SOLR: 2.0000 mg | Freq: Once | INTRAMUSCULAR | Status: DC | PRN

## 2019-05-28 MED ORDER — SODIUM CHLORIDE 0.9 % IV SOLN: 100.0000 mL | INTRAVENOUS | Status: DC | PRN

## 2019-05-28 MED ORDER — ERYTHROMYCIN BASE 250 MG PO TABS
250.0000 mg | ORAL_TABLET | Freq: Three times a day (TID) | ORAL | Status: DC
  Administered 2019-05-28 – 2019-06-16 (×51): 250 mg via ORAL
  Filled 2019-05-28 (×56): qty 1

## 2019-05-28 MED ORDER — INSULIN ASPART 100 UNIT/ML ~~LOC~~ SOLN
7.0000 [IU] | Freq: Every day | SUBCUTANEOUS | Status: DC
  Administered 2019-05-29 – 2019-06-04 (×6): 7 [IU] via SUBCUTANEOUS

## 2019-05-28 MED ORDER — ONDANSETRON HCL 4 MG/2ML IJ SOLN: 4.0000 mg | Freq: Four times a day (QID) | INTRAMUSCULAR | Status: DC | PRN

## 2019-05-28 MED ORDER — RENA-VITE PO TABS
1.0000 | ORAL_TABLET | Freq: Every day | ORAL | Status: DC
  Administered 2019-05-28 – 2019-06-13 (×13): 1 via ORAL
  Filled 2019-05-28 (×16): qty 1

## 2019-05-28 MED ORDER — ASPIRIN EC 81 MG PO TBEC
81.0000 mg | DELAYED_RELEASE_TABLET | Freq: Every day | ORAL | Status: DC
  Administered 2019-05-29 – 2019-06-16 (×16): 81 mg via ORAL
  Filled 2019-05-28 (×16): qty 1

## 2019-05-28 MED ORDER — CHLORHEXIDINE GLUCONATE CLOTH 2 % EX PADS
6.0000 | MEDICATED_PAD | Freq: Every day | CUTANEOUS | Status: DC
  Administered 2019-05-28 – 2019-06-15 (×8): 6 via TOPICAL

## 2019-05-28 MED ORDER — DRONABINOL 2.5 MG PO CAPS
2.5000 mg | ORAL_CAPSULE | Freq: Two times a day (BID) | ORAL | Status: DC
  Administered 2019-05-29 – 2019-06-16 (×30): 2.5 mg via ORAL
  Filled 2019-05-28 (×31): qty 1

## 2019-05-28 MED ORDER — INSULIN ASPART 100 UNIT/ML ~~LOC~~ SOLN
0.0000 [IU] | Freq: Three times a day (TID) | SUBCUTANEOUS | Status: DC
  Administered 2019-05-28: 3 [IU] via SUBCUTANEOUS
  Administered 2019-05-28: 5 [IU] via SUBCUTANEOUS
  Administered 2019-05-29 – 2019-05-30 (×2): 3 [IU] via SUBCUTANEOUS
  Administered 2019-05-30: 2 [IU] via SUBCUTANEOUS
  Administered 2019-05-31: 14:00:00 1 [IU] via SUBCUTANEOUS
  Administered 2019-05-31 – 2019-06-01 (×2): 2 [IU] via SUBCUTANEOUS
  Administered 2019-06-01: 12:00:00 3 [IU] via SUBCUTANEOUS
  Administered 2019-06-02: 2 [IU] via SUBCUTANEOUS
  Administered 2019-06-02: 3 [IU] via SUBCUTANEOUS
  Administered 2019-06-03: 7 [IU] via SUBCUTANEOUS
  Administered 2019-06-03 – 2019-06-04 (×3): 2 [IU] via SUBCUTANEOUS
  Administered 2019-06-04: 5 [IU] via SUBCUTANEOUS
  Administered 2019-06-05: 1 [IU] via SUBCUTANEOUS
  Administered 2019-06-06: 2 [IU] via SUBCUTANEOUS
  Administered 2019-06-06: 1 [IU] via SUBCUTANEOUS
  Administered 2019-06-06 – 2019-06-07 (×2): 2 [IU] via SUBCUTANEOUS
  Administered 2019-06-07: 3 [IU] via SUBCUTANEOUS
  Administered 2019-06-07: 2 [IU] via SUBCUTANEOUS
  Administered 2019-06-08: 3 [IU] via SUBCUTANEOUS
  Administered 2019-06-08 – 2019-06-09 (×4): 1 [IU] via SUBCUTANEOUS
  Administered 2019-06-09: 2 [IU] via SUBCUTANEOUS
  Administered 2019-06-11: 1 [IU] via SUBCUTANEOUS
  Administered 2019-06-11: 12:00:00 2 [IU] via SUBCUTANEOUS
  Administered 2019-06-12 – 2019-06-13 (×3): 1 [IU] via SUBCUTANEOUS
  Administered 2019-06-13: 3 [IU] via SUBCUTANEOUS
  Administered 2019-06-14 – 2019-06-15 (×3): 2 [IU] via SUBCUTANEOUS
  Administered 2019-06-15: 07:00:00 1 [IU] via SUBCUTANEOUS
  Administered 2019-06-15: 2 [IU] via SUBCUTANEOUS
  Administered 2019-06-16 (×2): 1 [IU] via SUBCUTANEOUS

## 2019-05-28 MED ORDER — ATORVASTATIN CALCIUM 40 MG PO TABS
40.0000 mg | ORAL_TABLET | Freq: Every day | ORAL | Status: DC
  Administered 2019-05-28 – 2019-06-15 (×17): 40 mg via ORAL
  Filled 2019-05-28 (×17): qty 1

## 2019-05-28 MED ORDER — ACETAMINOPHEN 325 MG PO TABS: 650.0000 mg | ORAL_TABLET | ORAL | Status: DC | PRN

## 2019-05-28 MED ORDER — DOCUSATE SODIUM 100 MG PO CAPS
100.0000 mg | ORAL_CAPSULE | Freq: Every day | ORAL | Status: DC | PRN
  Administered 2019-06-08: 10:00:00 100 mg via ORAL
  Filled 2019-05-28: qty 1

## 2019-05-28 MED ORDER — MIDODRINE HCL 5 MG PO TABS
5.0000 mg | ORAL_TABLET | ORAL | Status: DC
  Administered 2019-05-29 – 2019-06-14 (×7): 5 mg via ORAL
  Filled 2019-05-28 (×7): qty 1

## 2019-05-28 MED ORDER — CHLORHEXIDINE GLUCONATE 4 % EX LIQD
60.0000 mL | Freq: Once | CUTANEOUS | Status: AC
  Administered 2019-05-28: 4 via TOPICAL
  Filled 2019-05-28: qty 15

## 2019-05-28 MED ORDER — SEVELAMER CARBONATE 800 MG PO TABS
2400.0000 mg | ORAL_TABLET | Freq: Three times a day (TID) | ORAL | Status: DC
  Administered 2019-05-31 – 2019-06-04 (×2): 2400 mg via ORAL
  Filled 2019-05-28 (×12): qty 3

## 2019-05-28 MED ORDER — METOCLOPRAMIDE HCL 10 MG PO TABS
10.0000 mg | ORAL_TABLET | Freq: Three times a day (TID) | ORAL | Status: DC
  Administered 2019-05-29 – 2019-06-16 (×48): 10 mg via ORAL
  Filled 2019-05-28 (×48): qty 1

## 2019-05-28 MED ORDER — HEPARIN SODIUM (PORCINE) 1000 UNIT/ML DIALYSIS
1000.0000 [IU] | INTRAMUSCULAR | Status: DC | PRN
  Administered 2019-05-29: 11:00:00 3400 [IU] via INTRAVENOUS_CENTRAL

## 2019-05-28 MED ORDER — OXYCODONE HCL 5 MG PO TABS
5.0000 mg | ORAL_TABLET | Freq: Four times a day (QID) | ORAL | Status: DC | PRN
  Administered 2019-05-29 – 2019-06-01 (×7): 5 mg via ORAL
  Filled 2019-05-28 (×7): qty 1

## 2019-05-28 MED ORDER — CITALOPRAM HYDROBROMIDE 20 MG PO TABS
20.0000 mg | ORAL_TABLET | Freq: Every day | ORAL | Status: DC
  Administered 2019-05-29 – 2019-06-16 (×17): 20 mg via ORAL
  Filled 2019-05-28 (×17): qty 1

## 2019-05-28 MED ORDER — PROMETHAZINE HCL 25 MG PO TABS: 12.5000 mg | ORAL_TABLET | Freq: Four times a day (QID) | ORAL | Status: DC | PRN

## 2019-05-28 MED ORDER — ACETAMINOPHEN 325 MG PO TABS
650.0000 mg | ORAL_TABLET | ORAL | Status: DC | PRN
  Administered 2019-05-31 – 2019-06-14 (×4): 650 mg via ORAL
  Filled 2019-05-28 (×5): qty 2

## 2019-05-28 MED ORDER — LIDOCAINE HCL (PF) 1 % IJ SOLN: 5.0000 mL | INTRAMUSCULAR | Status: DC | PRN

## 2019-05-28 MED ORDER — CHLORHEXIDINE GLUCONATE 4 % EX LIQD
60.0000 mL | Freq: Once | CUTANEOUS | Status: DC
  Filled 2019-05-28: qty 15

## 2019-05-28 MED ORDER — GABAPENTIN 300 MG PO CAPS
300.0000 mg | ORAL_CAPSULE | Freq: Two times a day (BID) | ORAL | Status: DC
  Administered 2019-05-28 – 2019-06-16 (×35): 300 mg via ORAL
  Filled 2019-05-28 (×35): qty 1

## 2019-05-28 MED ORDER — LIDOCAINE-PRILOCAINE 2.5-2.5 % EX CREA: 1.0000 "application " | TOPICAL_CREAM | CUTANEOUS | Status: DC | PRN

## 2019-05-28 MED ORDER — PROMETHAZINE HCL 25 MG/ML IJ SOLN
12.5000 mg | Freq: Four times a day (QID) | INTRAMUSCULAR | Status: DC | PRN
  Administered 2019-06-02 – 2019-06-15 (×8): 12.5 mg via INTRAVENOUS
  Filled 2019-05-28 (×7): qty 1

## 2019-05-28 MED ORDER — LORAZEPAM 0.5 MG PO TABS
0.5000 mg | ORAL_TABLET | Freq: Two times a day (BID) | ORAL | Status: DC | PRN
  Administered 2019-05-30 – 2019-06-16 (×8): 0.5 mg via ORAL
  Filled 2019-05-28 (×9): qty 1

## 2019-05-28 MED ORDER — DOXERCALCIFEROL 4 MCG/2ML IV SOLN
2.0000 ug | INTRAVENOUS | Status: DC
  Administered 2019-05-29 – 2019-06-07 (×5): 2 ug via INTRAVENOUS
  Filled 2019-05-28 (×5): qty 2

## 2019-05-28 MED ORDER — SODIUM CHLORIDE 0.9 % IV SOLN: INTRAVENOUS | Status: DC

## 2019-05-28 MED ORDER — PROMETHAZINE HCL 25 MG/ML IJ SOLN
25.0000 mg | Freq: Once | INTRAMUSCULAR | Status: AC
  Administered 2019-05-28: 14:00:00 25 mg via INTRAMUSCULAR
  Filled 2019-05-28: qty 1

## 2019-05-28 MED ORDER — CALCITRIOL 0.25 MCG PO CAPS: 0.5000 ug | ORAL_CAPSULE | ORAL | Status: DC

## 2019-05-28 MED ORDER — INSULIN GLARGINE 100 UNIT/ML ~~LOC~~ SOLN
10.0000 [IU] | Freq: Every day | SUBCUTANEOUS | Status: DC
  Administered 2019-05-28 – 2019-06-16 (×18): 10 [IU] via SUBCUTANEOUS
  Filled 2019-05-28 (×20): qty 0.1

## 2019-05-28 MED ORDER — CEFAZOLIN SODIUM-DEXTROSE 2-4 GM/100ML-% IV SOLN
2.0000 g | INTRAVENOUS | Status: AC
  Administered 2019-05-29: 2 g via INTRAVENOUS
  Filled 2019-05-28: qty 100

## 2019-05-28 MED ORDER — PANTOPRAZOLE SODIUM 40 MG PO TBEC
40.0000 mg | DELAYED_RELEASE_TABLET | Freq: Two times a day (BID) | ORAL | Status: DC
  Administered 2019-05-29 – 2019-06-16 (×32): 40 mg via ORAL
  Filled 2019-05-28 (×32): qty 1

## 2019-05-28 MED ORDER — PENTAFLUOROPROP-TETRAFLUOROETH EX AERO: 1.0000 "application " | INHALATION_SPRAY | CUTANEOUS | Status: DC | PRN

## 2019-05-28 NOTE — Progress Notes (Signed)
ANTICOAGULATION CONSULT NOTE - Initial Consult  Pharmacy Consult for warfarin  Indication: LVAD HM3  Allergies  Allergen Reactions  . Metformin And Related Diarrhea    Patient Measurements:     Vital Signs: Temp: 98.3 F (36.8 C) (09/22 1100) Temp Source: Oral (09/22 1100) BP: 110/92 (09/22 1200) Pulse Rate: 93 (09/22 1200)  Labs: Recent Labs    05/28/19 1025 05/28/19 1055  HGB  --  10.2*  HCT  --  32.0*  PLT  --  330  LABPROT 26.7*  --   INR 2.5*  --     Estimated Creatinine Clearance: 8.5 mL/min (A) (by C-G formula based on SCr of 10.75 mg/dL (H)).   Medical History: Past Medical History:  Diagnosis Date  . Angina   . ASCVD (arteriosclerotic cardiovascular disease)    , Anterior infarction 2005, LAD diagonal bifurcation intervention 03/2004  . Automatic implantable cardiac defibrillator -[employer]       . Benign neoplasm of colon   . CHF (congestive heart failure) ([place])   . Chronic systolic heart failure ([place])   . Coronary artery disease     Widely patent previously placed stents in the left anterior   . Crohn's disease ([place])   . Deep venous thrombosis ([facility])    Recurrent-on Coumadin  . Dialysis patient ([place])   . Dyspnea   . Gastroparesis   . GERD (gastroesophageal reflux disease)   . High cholesterol   . Hyperlipidemia   . Hypersomnolent    Previous diagnosis of narcolepsy  . Hypertension, essential   . Ischemic cardiomyopathy    Ejection fraction 15-20% catheterization 2010  . Type II or unspecified type diabetes mellitus without mention of complication, not stated as uncontrolled   . Unspecified gastritis and gastroduodenitis without mention of hemorrhage      Assessment: 26yom with LVAD on warfarin PTA admitted for toe amputation dt PAD/ischemia.   INR last week in clinic 3.9 - warfarin has been on hold since thur INR on admit 2.5 - f/u post op for need for heparin for [facility] or restart warfarin.   ESRD MWF  PTA warfarin 7.61m Tu/Fr 580m QOD  Goal of Therapy:  INR 2-3 Monitor platelets by anticoagulation protocol: Yes   Plan:  No warfarin for now  Daily Protime   LiBonnita Nasutiharm.D. CPP, BCPS Clinical Pharmacist [phone]/22/2020 2:43 PM

## 2019-05-28 NOTE — CV Procedure (Signed)
Central Venous Catheter Insertion Procedure Note [name] [id number] 10/07/1965    Procedure: Insertion of Central Venous Catheter Indications: Drug and/or fluid administration   Procedure Details Consent: Risks of procedure as well as the alternatives and risks of each were explained to the (patient/caregiver).  Consent for procedure obtained. Time Out: Verified patient identification, verified procedure, site/side was marked, verified correct patient position, special equipment/implants available, medications/allergies/relevent history reviewed, required imaging and test results available.  Performed   Maximum sterile technique was used including antiseptics, cap, gloves, gown, hand hygiene, mask and sheet. Skin prep: Chlorhexidine; local anesthetic administered A antimicrobial bonded/coated triple lumen catheter was placed in the right internal jugular vein using the Seldinger technique and u/s guidance.   Evaluation Blood flow good Complications: No apparent complications Patient did tolerate procedure well. Chest X-ray ordered to verify placement.  CXR: OK   [name], MD  12:43 PM

## 2019-05-28 NOTE — H&P (Addendum)
Advanced Heart Failure VAD History and Physical Note   PCP-Cardiologist: No primary care provider on file.   Reason for Admission: Ischemic Left Foot for left great toe amputation.   HPI:    [name] is a 53 year old withCAD s/p anterior MI in 2010 and NSTEMI in 3/18 with DES to pRCA and mLAD, gastroparesis,ischemic cardiomyopathy now s/p Heartmate 3 LVAD and ESRD, factor V leiden, PAF, and ischemic foot.  Admitted 7/10 with ischemic right foot. VVS consulted and on 7/13 he underwent peripheral angiogram that showed 80% stenosis r SFV and occluded right PT and had stent to right SFA and angioplasty to PT. He then had 4th toe amputation. He was discharged to home on 03/22/19.  Admitted 03/27/19 withrecurrent n/v, leukocytosis and ischemic changes in right foot. Admit ICU for treatment of severe gastroparesisand possible ischemic foot. VVS , Nephrology, ID consulted. He was treated with IV vancomycin for possible osteomyelitis/infection, but thought that more likely ischemia could explain all the findings. He was discharged home to followup with vascular surgery.   Scheduled admit due ischemia left great toe. Plan for left great toe amputation. Last dose of coumadin was on 05/24/19. Having difficulty walking. Denies BRBPR. Denies SOB. Complaining of nausea. Denies fever or chills.  LVAD INTERROGATION:  HeartMate III LVAD:  Flow 4.2  liters/min, speed 5500 power 4.8  PI 5.1      Review of Systems: [y] = yes, [ ]  = no   General: Weight gain [ ] ; Weight loss [ ] ; Anorexia [ ] ; Fatigue [Y ]; Fever [ ] ; Chills [ ] ; Weakness [ Y]  Cardiac: Chest pain/pressure [ ] ; Resting SOB [ ] ; Exertional SOB [ ] ; Orthopnea [ ] ; Pedal Edema [ ] ; Palpitations [ ] ; Syncope [ ] ; Presyncope [ ] ; Paroxysmal nocturnal dyspnea[ ]   Pulmonary: Cough [ ] ; Wheezing[ ] ; Hemoptysis[ ] ; Sputum [ ] ; Snoring [ ]   GI: Vomiting[ ] ; Dysphagia[ ] ; Melena[ ] ; Hematochezia [ ] ; Heartburn[ ] ; Abdominal pain [ ] ;  Constipation [ ] ; Diarrhea [ ] ; BRBPR [ ]   GU: Hematuria[ ] ; Dysuria [ ] ; Nocturia[ ]   Vascular: Pain in legs with walking [ Y]; Pain in feet with lying flat [ ] ; Non-healing sores [ ] ; Stroke [ ] ; TIA [ ] ; Slurred speech [ ] ;  Neuro: Headaches[ ] ; Vertigo[ ] ; Seizures[ ] ; Paresthesias[ ] ;Blurred vision [ ] ; Diplopia [ ] ; Vision changes [ ]   Ortho/Skin: Arthritis [ ] ; Joint pain [Y ]; Muscle pain [Y ]; Joint swelling [ ] ; Back Pain [Y ]; Rash [ ]   Psych: Depression[Y ]; Anxiety[ ]   Heme: Bleeding problems [ ] ; Clotting disorders [ ] ; Anemia [ ]   Endocrine: Diabetes [Y ]; Thyroid dysfunction[ ]     Home Medications Prior to Admission medications   Medication Sig Start Date End Date Taking? Authorizing Provider  acetaminophen (TYLENOL) 325 MG tablet Take 2 tablets (650 mg total) by mouth every 4 (four) hours as needed for headache or mild pain. 03/22/19  Yes Kroeger, Lorelee Cover., PA-C  aspirin EC 81 MG tablet Take 1 tablet (81 mg total) by mouth daily. 08/20/18  Yes Eileen Stanford, PA-C  atorvastatin (LIPITOR) 40 MG tablet Take 1 tablet (40 mg total) by mouth daily at 6 PM. 01/25/18  Yes [name], MD  calcitRIOL (ROCALTROL) 0.5 MCG capsule Take 1 capsule (0.5 mcg total) by mouth every Monday, Wednesday, and Friday. 06/06/18  Yes Georgiana Shore, NP  citalopram (CELEXA) 20 MG tablet Take 1 tablet (20 mg total)  by mouth daily. 03/19/18  Yes Georgiana Shore, NP  docusate sodium (COLACE) 100 MG capsule Take 1 capsule (100 mg total) by mouth daily as needed for mild constipation. Patient taking differently: Take 100 mg by mouth daily.  03/22/19  Yes Kroeger, Daleen Snook M., PA-C  dronabinol (MARINOL) 2.5 MG capsule Take 1 capsule (2.5 mg total) by mouth 2 (two) times daily before lunch and supper. 03/27/18  Yes Larey Dresser, MD  erythromycin (ERYTHROCIN) 250 MG tablet Take 250 mg by mouth 3 (three) times daily.   Yes [provider]  gabapentin (NEURONTIN) 300 MG capsule Take 1  capsule (300 mg total) by mouth 2 (two) times daily. 04/16/19  Yes Larey Dresser, MD  insulin glargine (LANTUS) 100 UNIT/ML injection Inject 0.1 mLs (10 Units total) into the skin daily. 05/23/19  Yes Anderson, Chelsey L, DO  LORazepam (ATIVAN) 0.5 MG tablet Take 1 tablet (0.5 mg total) by mouth every 12 (twelve) hours as needed (nausea). 05/17/19  Yes Larey Dresser, MD  metoCLOPramide (REGLAN) 10 MG tablet Take 1 tablet (10 mg total) by mouth 3 (three) times daily. Patient taking differently: Take 10 mg by mouth 3 (three) times daily with meals.  01/22/18  Yes Georgiana Shore, NP  midodrine (PROAMATINE) 5 MG tablet Take 1 tablet (5 mg total) by mouth daily. Take 1 tablet before dialysis treatment Patient taking differently: Take 5 mg by mouth every Monday, Wednesday, and Friday. Take 1 tablet before dialysis treatment 04/04/19  Yes Clegg, Amy D, NP  multivitamin (RENA-VIT) TABS tablet Take 1 tablet by mouth daily.   Yes [provider]  NOVOLOG 100 UNIT/ML injection INJECT 7 UNITS INTO THE SKIN DAILY WITH SUPPER. Patient taking differently: Inject 7 Units into the skin daily with supper.  03/15/19  Yes Anderson, Chelsey L, DO  oxyCODONE (ROXICODONE) 5 MG immediate release tablet Take 1 tablet (5 mg total) by mouth 2 (two) times daily as needed for severe pain. Take 1-2 tablets every 4-6 hours for severe pain. 05/21/19  Yes Larey Dresser, MD  pantoprazole (PROTONIX) 40 MG tablet Take 1 tablet (40 mg total) by mouth 2 (two) times daily before lunch and supper. 05/07/19  Yes Barrett, Evelene Croon, PA-C  promethazine (PHENERGAN) 12.5 MG tablet Take 1 tablet (12.5 mg total) by mouth every 6 (six) hours as needed for nausea or vomiting. 10/23/18  Yes Larey Dresser, MD  traMADol (ULTRAM) 50 MG tablet Take 1 tablet (50 mg total) by mouth every 6 (six) hours as needed for moderate pain. 05/17/19  Yes Larey Dresser, MD  warfarin (COUMADIN) 5 MG tablet TAKE AS FOLLOWS: MON-WED-FRI 1&1/2 TABLETS DAILY.  ON ALL OTHER DAYS 1 TABLET Patient taking differently: Take 5 mg by mouth daily at 6 PM. TAKE 1.5 TABLETS (7.5 MG) BY MOUTH ON TUESDAYS & FRIDAYS; TAKE 1 TABLET (5 MG) BY MOUTH ON ALL OTHER DAYS. 09/12/18  Yes Larey Dresser, MD    Past Medical History: Past Medical History:  Diagnosis Date   Angina    ASCVD (arteriosclerotic cardiovascular disease)    , Anterior infarction 2005, LAD diagonal bifurcation intervention 03/2004   Automatic implantable cardiac defibrillator -St. Jude's        Benign neoplasm of colon    CHF (congestive heart failure) (HCC)    Chronic systolic heart failure (HCC)    Coronary artery disease     Widely patent previously placed stents in the left anterior    Crohn's disease ([place])  Deep venous thrombosis (HCC)    Recurrent-on Coumadin   Dialysis patient ([place])    Dyspnea    Gastroparesis    GERD (gastroesophageal reflux disease)    High cholesterol    Hyperlipidemia    Hypersomnolent    Previous diagnosis of narcolepsy   Hypertension, essential    Ischemic cardiomyopathy    Ejection fraction 15-20% catheterization 2010   Type II or unspecified type diabetes mellitus without mention of complication, not stated as uncontrolled    Unspecified gastritis and gastroduodenitis without mention of hemorrhage     Past Surgical History: Past Surgical History:  Procedure Laterality Date   A/V SHUNTOGRAM N/A 05/24/2018   Procedure: A/V SHUNTOGRAM;  Surgeon: Marty Heck, MD;  Location: [place] [facility];  Service: Cardiovascular;  Laterality: N/A;   ABDOMINAL AORTOGRAM W/LOWER EXTREMITY N/A 03/18/2019   Procedure: ABDOMINAL AORTOGRAM W/LOWER EXTREMITY;  Surgeon: Waynetta Sandy, MD;  Location: [place] [facility];  Service: Cardiovascular;  Laterality: N/A;   AMPUTATION Right 03/21/2019   Procedure: AMPUTATION DIGIT RIGHT FOURTH TOE;  Surgeon: Waynetta Sandy, MD;  Location: [place];  Service: Vascular;  Laterality:  Right;   APPENDECTOMY     AV FISTULA PLACEMENT Right 06/26/2017   Procedure: ARTERIOVENOUS (AV) FISTULA CREATION VERSUS GRAFT RIGHT  ARM;  Surgeon: Conrad Cortez, MD;  Location: [place];  Service: Vascular;  Laterality: Right;   AV FISTULA PLACEMENT Right 10/31/2017   Procedure: INSERTION OF ARTERIOVENOUS (AV) ARTEGRAFT,  RIGHT UPPER ARM;  Surgeon: Conrad [place], MD;  Location: [place];  Service: Vascular;  Laterality: Right;   AV FISTULA PLACEMENT Left 05/29/2018   Procedure: ARTERIOVENOUS (AV) FISTULA CREATION  LEFT UPPER EXTREMITY;  Surgeon: Waynetta Sandy, MD;  Location: [place];  Service: Vascular;  Laterality: Left;   AV FISTULA PLACEMENT Left 08/09/2018   Procedure: INSERTION OF ARTERIOVENOUS (AV) GORE-TEX GRAFT LEFT UPPER ARM;  Surgeon: Waynetta Sandy, MD;  Location: [place];  Service: Vascular;  Laterality: Left;   CARDIAC DEFIBRILLATOR PLACEMENT  2010   St. Jude ICD   CENTRAL LINE INSERTION Right 05/24/2018   Procedure: CENTRAL LINE INSERTION;  Surgeon: Marty Heck, MD;  Location: [place] [facility];  Service: Cardiovascular;  Laterality: Right;   COLECTOMY  ~ 2003   "for Crohn's" ascending colon.     CORONARY STENT INTERVENTION N/A 11/25/2016   Procedure: Coronary Stent Intervention;  Surgeon: Leonie Man, MD;  Location: [place] [facility];  Service: Cardiovascular;  Laterality: N/A;   EP IMPLANTABLE DEVICE N/A 09/14/2016   Procedure: ICD Generator Changeout;  Surgeon: Deboraha Sprang, MD;  Location: [place] [facility];  Service: Cardiovascular;  Laterality: N/A;   ESOPHAGOGASTRODUODENOSCOPY N/A 07/12/2017   Procedure: ESOPHAGOGASTRODUODENOSCOPY (EGD);  Surgeon: Jerene Bears, MD;  Location[facility];  Service: Gastroenterology;  Laterality: N/A;  VAD pt so VAD team will need to accompany pt.    FETAL SURGERY FOR CONGENITAL HERNIA     ????? pt has no knowledge of this.[name]     FISTULOGRAM Left 08/09/2018   Procedure: FISTULOGRAM LEFT ARM;  Surgeon:  Waynetta Sandy, MD;  Location: [place];  Service: Vascular;  Laterality: Left;   INGUINAL HERNIA REPAIR     INSERTION OF DIALYSIS CATHETER Right 06/26/2017   Procedure: INSERTION OF TUNNELED  DIALYSIS CATHETER RIGHT INTERNAL JUGULAR;  Surgeon: Conrad [place], MD;  Location: [place];  Service: Vascular;  Laterality: Right;   INSERTION OF IMPLANTABLE LEFT VENTRICULAR ASSIST  DEVICE N/A 06/12/2017   Procedure: INSERTION OF IMPLANTABLE LEFT VENTRICULAR ASSIST [place] 3;  Surgeon: Ivin Poot, MD;  Location: [place];  Service: Open Heart Surgery;  Laterality: N/A;  HM3 LVAD  CIRC ARREST  NITRIC OXIDE   INSERTION OF IMPLANTABLE LEFT VENTRICULAR ASSIST DEVICE N/A 02/28/2018   Procedure: REDO INSERTION OF IMPLANTABLE LEFT VENTRICULAR ASSIST DEVICE - HEARTMATE 3;  Surgeon: Ivin Poot, MD;  Location: [place];  Service: Open Heart Surgery;  Laterality: N/A;   IR REMOVAL TUN CV CATH W/O FL  02/26/2018   IR THORACENTESIS ASP PLEURAL SPACE W/IMG GUIDE  06/28/2017   LIGATION OF ARTERIOVENOUS  FISTULA Right 10/31/2017   Procedure: LIGATION OF BRACHIO-BASILIC VEIN TRANSPOSITION RIGHT ARM;  Surgeon: Conrad [place], MD;  Location: [place];  Service: Vascular;  Laterality: Right;   LOWER EXTREMITY ANGIOGRAPHY  05/02/2019   Procedure: Lower Extremity Angiography;  Surgeon: Marty Heck, MD;  Location: [place] [facility];  Service: Cardiovascular;;   MULTIPLE EXTRACTIONS WITH ALVEOLOPLASTY N/A 06/08/2017   Procedure: MULTIPLE EXTRACTION WITH ALVEOLOPLASTY AND PRE PROSTHETIC SURGERY AS NEEDED;  Surgeon: Lenn Cal, DDS;  Location: [place];  Service: Oral Surgery;  Laterality: N/A;   PERIPHERAL VASCULAR BALLOON ANGIOPLASTY  03/18/2019   Procedure: PERIPHERAL VASCULAR BALLOON ANGIOPLASTY;  Surgeon: Waynetta Sandy, MD;  Location: [place] [facility];  Service: Cardiovascular;;  RT PT   PERIPHERAL VASCULAR INTERVENTION  03/18/2019   Procedure: PERIPHERAL VASCULAR  INTERVENTION;  Surgeon: Waynetta Sandy, MD;  Location: [place] [facility];  Service: Cardiovascular;;  RT SFA   PERIPHERAL VASCULAR INTERVENTION  05/02/2019   Procedure: PERIPHERAL VASCULAR INTERVENTION;  Surgeon: Marty Heck, MD;  Location: [place] [facility];  Service: Cardiovascular;;  Lt. SFA   RIGHT HEART CATH N/A 06/07/2017   Procedure: RIGHT HEART CATH;  Surgeon: Larey Dresser, MD;  Location: [place] [facility];  Service: Cardiovascular;  Laterality: N/A;   RIGHT HEART CATH N/A 02/08/2018   Procedure: RIGHT HEART CATH;  Surgeon: Larey Dresser, MD;  Location: [place] [facility];  Service: Cardiovascular;  Laterality: N/A;   RIGHT HEART CATH N/A 08/09/2018   Procedure: RIGHT HEART CATH;  Surgeon: Larey Dresser, MD;  Location: [place] [facility];  Service: Cardiovascular;  Laterality: N/A;   RIGHT/LEFT HEART CATH AND CORONARY ANGIOGRAPHY N/A 11/23/2016   Procedure: Right/Left Heart Cath and Coronary Angiography;  Surgeon: Larey Dresser, MD;  Location: [place] [facility];  Service: Cardiovascular;  Laterality: N/A;   TEE WITHOUT CARDIOVERSION N/A 06/12/2017   Procedure: TRANSESOPHAGEAL ECHOCARDIOGRAM (TEE);  Surgeon: Prescott Gum, Collier Salina, MD;  Location: [place];  Service: Open Heart Surgery;  Laterality: N/A;   TEE WITHOUT CARDIOVERSION N/A 02/28/2018   Procedure: TRANSESOPHAGEAL ECHOCARDIOGRAM (TEE);  Surgeon: Prescott Gum, Collier Salina, MD;  Location: [place];  Service: Open Heart Surgery;  Laterality: N/A;   TEMPORARY DIALYSIS CATHETER Left 05/24/2018   Procedure: TEMPORARY DIALYSIS CATHETER;  Surgeon: Marty Heck, MD;  Location: [place] [facility];  Service: Cardiovascular;  Laterality: Left;   THROMBECTOMY AND REVISION OF ARTERIOVENTOUS (AV) GORETEX  GRAFT Right 12/12/2017   Procedure: THROMBECTOMY ARTERIOVENTOUS (AV) GORETEX  GRAFT RIGHT UPPER ARM;  Surgeon: Conrad [place], MD;  Location: [place];  Service: Vascular;  Laterality: Right;   TRANSMETATARSAL AMPUTATION Right  04/30/2019   Procedure: RIGHT TRANSMETATARSAL AMPUTATION;  Surgeon: Waynetta Sandy, MD;  Location: [place];  Service: Vascular;  Laterality: Right;    Family  History: Family History  Problem Relation Age of Onset   Colon polyps Mother    Diabetes Mother    Heart attack Father    Diabetes Father    Stroke Paternal Grandfather    Colitis Sister    Heart disease Brother    Colitis Sister    Colon cancer Neg Hx     Social History: Social History   Socioeconomic History   Marital status: Married    Spouse name: Not on file   Number of children: Not on file   Years of education: Not on file   Highest education level: Not on file  Occupational History   Not on file  Social Needs   Financial resource strain: Not on file   Food insecurity    Worry: Not on file    Inability: Not on file   Transportation needs    Medical: Not on file    Non-medical: Not on file  Tobacco Use   Smoking status: Never Smoker   Smokeless tobacco: Never Used  Substance and Sexual Activity   Alcohol use: No   Drug use: No   Sexual activity: Never  Lifestyle   Physical activity    Days per week: Not on file    Minutes per session: Not on file   Stress: Not on file  Relationships   Social connections    Talks on phone: Not on file    Gets together: Not on file    Attends religious service: Not on file    Active member of club or organization: Not on file    Attends meetings of clubs or organizations: Not on file    Relationship status: Not on file  Other Topics Concern   Not on file  Social History Narrative   Not on file    Allergies:  Allergies  Allergen Reactions   Metformin And Related Diarrhea    Objective:    Vital Signs:       There were no vitals filed for this visit.  Mean arterial Pressure 102   Physical Exam    General: No resp difficulty HEENT: Normal Neck: supple. JVP flat. Carotids 2+ bilat; no bruits. No lymphadenopathy  or thyromegaly appreciated. Cor: Mechanical heart sounds with LVAD hum present. Lungs: Clear Abdomen: soft, nontender, nondistended. No hepatosplenomegaly. No bruits or masses. Good bowel sounds. Driveline: C/D/I; securement device intact and driveline incorporated Extremities: no cyanosis, clubbing, rash, edema. L great toe black. R foot dressing intact.  Neuro: alert & orientedx3, cranial nerves grossly intact. moves all 4 extremities w/o difficulty. Affect pleasant   Telemetry  NSR 90s    Labs    Basic Metabolic Panel: No results for input(s): NA, K, CL, CO2, GLUCOSE, BUN, CREATININE, CALCIUM, MG, PHOS in the last 168 hours.  Liver Function Tests: No results for input(s): AST, ALT, ALKPHOS, BILITOT, PROT, ALBUMIN in the last 168 hours. No results for input(s): LIPASE, AMYLASE in the last 168 hours. No results for input(s): AMMONIA in the last 168 hours.  CBC: No results for input(s): WBC, NEUTROABS, HGB, HCT, MCV, PLT in the last 168 hours.  Cardiac Enzymes: No results for input(s): CKTOTAL, CKMB, CKMBINDEX, TROPONINI in the last 168 hours.  BNP: BNP (last 3 results) No results for input(s): BNP in the last 8760 hours.  ProBNP (last 3 results) No results for input(s): PROBNP in the last 8760 hours.   CBG: No results for input(s): GLUCAP in the last 168 hours.  Coagulation Studies: No results  for input(s): LABPROT, INR in the last 72 hours.  Other results:   Imaging     No results found.      Assessment/Plan:    1. Ischemic Foot/ Left Great Toe/PAD  Admit for scheduled left great toe amputation by Dr Radene Gunning.   Last dose of coumadin was 9/18.   2. Chronic systolic CHF: Ischemic cardiomyopathy. St Jude ICD. NSTEMI in March 2018 with DES to LAD and RCA, complicated by cardiogenic shock, low output requiring milrinone. Echo 8/18 with EF 20-25%, moderate [name]. CPX (8/18) with severe functional impairment due to HF. He is s/p HM3 LVAD placement. He had pump  thrombosis 6/19 due to [facility] outflow graft kinking. He had pump exchange for another HM3.  Not a candidate for transplant for PAD.  -Volume status managed per HD  Continue ASA 81. Off coumadin due to surgery.   - Continue hydralazine 25 mg tid on non-HD days.  3. ESRD: HD on M-W-F with midodrine pre-HD. Consult Nephrology.  4.CAD: NSTEMI in 3/18. LHC with 99% ulcerated lesion proximal RCA with left to right collaterals, 95% mid LAD stenosis after mid LAD stent. s/p PCI to RCA and LAD on 11/25/16. -No chest pain.  - Continue statin and ASA. 5. h/o DVTs: Factor V Leiden heterozygote. -Anticoagulated. 6. Diabetic gastroparesis: He has seen gastroparesis specialist at [facility]. Surprisingly, gastric emptying study was normal. However, electrogastrogram showed poor gastric accomodation/capacity. Therefore, small frequent meals recommended. - Continue reglan 10 mg tid/ac and Marinol.  - Continue Protonix bid.  - He will continue erythromycinindefinitely.  - Limit narcotic use as much as possible. 7. NAT:FTDDUK today.    I reviewed the LVAD parameters from today, and compared the results to the patient's prior recorded data.  No programming changes were made.  The LVAD is functioning within specified parameters.  The patient performs LVAD self-test daily.  LVAD interrogation was negative for any significant power changes, alarms or PI events/speed drops.  LVAD equipment check completed and is in good working order.  Back-up equipment present.   LVAD education done on emergency procedures and precautions and reviewed exit site care.  Length of Stay: 0  Darrick Grinder, NP 05/28/2019, 10:36 AM  VAD Team Pager [phone] (7am - 7am) +++VAD ISSUES ONLY+++   Advanced Heart Failure Team Pager [phone] (M-F; [place])  Please contact [facility] for night-coverage after hours (4p -7a ) and weekends on [email or web address] for all non- LVAD Issues  Patient seen with NP, agree with the above note.     He is admitted for scheduled amputation of left great toe tomorrow.  He has not been walking at home due to foot pain.  Mild nausea today.    MAP mildly elevated in 90s.   INR 2.5 today, has been off warfarin since Friday.   General: Well appearing this am. NAD.  HEENT: Normal. Neck: Supple, JVP 7-8 cm. Carotids OK.  Cardiac:  Mechanical heart sounds with LVAD hum present.  Lungs:  CTAB, normal effort.  Abdomen:  NT, ND, no HSM. No bruits or masses. +BS  LVAD exit site: Well-healed and incorporated. Dressing dry and intact. No erythema or drainage. Stabilization device present and accurately applied. Driveline dressing changed daily per sterile technique. Extremities:  S/p right TMA, gangrenous left great toe.  Neuro:  Alert & oriented x 3. Cranial nerves grossly intact. Moves all 4 extremities w/o difficulty. Affect pleasant    Patient was originally planned for left great toe amputation tomorrow but INR  still 2.5.  Will let vascular surgery know, they will make decision on surgery tomorrow versus move to Thursday.    Nephrology notified of his admission, due for HD tomorrow.  If he has surgery tomorrow, will have to time HD around this.   It is very difficult for him to walk due to foot pain, has had right TMA and left great toe amputation planned.  I do not think he will be able to go home right away.  If CIR is not an option, will need to find SNF.  Will consult PT/OT and go ahead and work on a place for him to go at discharge.   [name] 05/28/2019 1:39 PM

## 2019-05-28 NOTE — Progress Notes (Signed)
Transferred to 2c09 by bed. Very sleepy after phenergan iv given

## 2019-05-28 NOTE — Consult Note (Addendum)
[facility] Renal Consultation Note    Indication for Consultation:  Management of ESRD/hemodialysis; anemia, hypertension/volume and secondary hyperparathyroidism  HPI: [name] is a 53 y.o. male with ESRD on HD, CAD, severe ICM (s/p Heartmate 3 LVAD), Type 2 DM with severe gastroparesis, paroxymal A-fib on Coumadin,  Factor V Leiden, HTN.  Admitted for planned amputation of L great toe. S/p right TMA by Dr. Donzetta Matters for non-healing amputation site. Underwent aortogram with Dr. Carlis Abbott on 8/27 with stent to left SFA. Seen by VVS 9/17 with gangrenous changes to left foot. Admitted to HF service for coumadin to heparin bridge before surgery. INR 2.5 on admission.   Seen and examined at bedside. Very drowsy and falling asleep during questioning. Denies CP, SOB, N,V,D.   Dialyzes MWF at [facility]. Has been compliant with dialysis Rx. Due for routing HD tomorrow.   Past Medical History:  Diagnosis Date  . Angina   . ASCVD (arteriosclerotic cardiovascular disease)    , Anterior infarction 2005, LAD diagonal bifurcation intervention 03/2004  . Automatic implantable cardiac defibrillator -St. Jude's       . Benign neoplasm of colon   . CHF (congestive heart failure) ([place])   . Chronic systolic heart failure ([place])   . Coronary artery disease     Widely patent previously placed stents in the left anterior   . Crohn's disease ([place])   . Deep venous thrombosis (HCC)    Recurrent-on Coumadin  . Dialysis patient ([place])   . Dyspnea   . Gastroparesis   . GERD (gastroesophageal reflux disease)   . High cholesterol   . Hyperlipidemia   . Hypersomnolent    Previous diagnosis of narcolepsy  . Hypertension, essential   . Ischemic cardiomyopathy    Ejection fraction 15-20% catheterization 2010  . Type II or unspecified type diabetes mellitus without mention of complication, not stated as uncontrolled   . Unspecified gastritis and gastroduodenitis without mention of  hemorrhage    Past Surgical History:  Procedure Laterality Date  . A/V SHUNTOGRAM N/A 05/24/2018   Procedure: A/V SHUNTOGRAM;  Surgeon: Marty Heck, MD;  Location: [place] [facility];  Service: Cardiovascular;  Laterality: N/A;  . ABDOMINAL AORTOGRAM W/LOWER EXTREMITY N/A 03/18/2019   Procedure: ABDOMINAL AORTOGRAM W/LOWER EXTREMITY;  Surgeon: Waynetta Sandy, MD;  Location: [place] [facility];  Service: Cardiovascular;  Laterality: N/A;  . AMPUTATION Right 03/21/2019   Procedure: AMPUTATION DIGIT RIGHT FOURTH TOE;  Surgeon: Waynetta Sandy, MD;  Location: [place];  Service: Vascular;  Laterality: Right;  . APPENDECTOMY    . AV FISTULA PLACEMENT Right 06/26/2017   Procedure: ARTERIOVENOUS (AV) FISTULA CREATION VERSUS GRAFT RIGHT  ARM;  Surgeon: Conrad Chinchilla, MD;  Location: [place];  Service: Vascular;  Laterality: Right;  . AV FISTULA PLACEMENT Right 10/31/2017   Procedure: INSERTION OF ARTERIOVENOUS (AV) ARTEGRAFT,  RIGHT UPPER ARM;  Surgeon: Conrad Guadalupe, MD;  Location: [place];  Service: Vascular;  Laterality: Right;  . AV FISTULA PLACEMENT Left 05/29/2018   Procedure: ARTERIOVENOUS (AV) FISTULA CREATION  LEFT UPPER EXTREMITY;  Surgeon: Waynetta Sandy, MD;  Location: [place];  Service: Vascular;  Laterality: Left;  . AV FISTULA PLACEMENT Left 08/09/2018   Procedure: INSERTION OF ARTERIOVENOUS (AV) GORE-TEX GRAFT LEFT UPPER ARM;  Surgeon: Waynetta Sandy, MD;  Location: [place];  Service: Vascular;  Laterality: Left;  . CARDIAC DEFIBRILLATOR PLACEMENT  2010   St. Jude ICD  . CENTRAL LINE INSERTION  Right 05/24/2018   Procedure: CENTRAL LINE INSERTION;  Surgeon: Marty Heck, MD;  Location: [place] [facility];  Service: Cardiovascular;  Laterality: Right;  . COLECTOMY  ~ 2003   "for Crohn's" ascending colon.    . CORONARY STENT INTERVENTION N/A 11/25/2016   Procedure: Coronary Stent Intervention;  Surgeon: Leonie Man, MD;  Location: [place]  CV LAB;  Service: Cardiovascular;  Laterality: N/A;  . EP IMPLANTABLE DEVICE N/A 09/14/2016   Procedure: ICD Generator Changeout;  Surgeon: Deboraha Sprang, MD;  Location: [place] [facility];  Service: Cardiovascular;  Laterality: N/A;  . ESOPHAGOGASTRODUODENOSCOPY N/A 07/12/2017   Procedure: ESOPHAGOGASTRODUODENOSCOPY (EGD);  Surgeon: Jerene Bears, MD;  Location: [facility] ENDOSCOPY;  Service: Gastroenterology;  Laterality: N/A;  VAD pt so VAD team will need to accompany pt.   [name] FETAL SURGERY FOR CONGENITAL HERNIA     ????? pt has no knowledge of this..    . FISTULOGRAM Left 08/09/2018   Procedure: FISTULOGRAM LEFT ARM;  Surgeon: Waynetta Sandy, MD;  Location: [place];  Service: Vascular;  Laterality: Left;  . INGUINAL HERNIA REPAIR    . INSERTION OF DIALYSIS CATHETER Right 06/26/2017   Procedure: INSERTION OF TUNNELED  DIALYSIS CATHETER RIGHT INTERNAL JUGULAR;  Surgeon: Conrad Wilsonville, MD;  Location: [place];  Service: Vascular;  Laterality: Right;  . INSERTION OF IMPLANTABLE LEFT VENTRICULAR ASSIST DEVICE N/A 06/12/2017   Procedure: INSERTION OF IMPLANTABLE LEFT VENTRICULAR ASSIST [place] 3;  Surgeon: Ivin Poot, MD;  Location: [place];  Service: Open Heart Surgery;  Laterality: N/A;  HM3 LVAD  CIRC ARREST  NITRIC OXIDE  . INSERTION OF IMPLANTABLE LEFT VENTRICULAR ASSIST DEVICE N/A 02/28/2018   Procedure: REDO INSERTION OF IMPLANTABLE LEFT VENTRICULAR ASSIST DEVICE - HEARTMATE 3;  Surgeon: Ivin Poot, MD;  Location: [place];  Service: Open Heart Surgery;  Laterality: N/A;  . IR REMOVAL TUN CV CATH W/O FL  02/26/2018  . IR THORACENTESIS ASP PLEURAL SPACE W/IMG GUIDE  06/28/2017  . LIGATION OF ARTERIOVENOUS  FISTULA Right 10/31/2017   Procedure: LIGATION OF BRACHIO-BASILIC VEIN TRANSPOSITION RIGHT ARM;  Surgeon: Conrad Cherry, MD;  Location: [place];  Service: Vascular;  Laterality: Right;  . LOWER EXTREMITY ANGIOGRAPHY  05/02/2019   Procedure: Lower Extremity Angiography;  Surgeon:  Marty Heck, MD;  Location: [place] [facility];  Service: Cardiovascular;;  . MULTIPLE EXTRACTIONS WITH ALVEOLOPLASTY N/A 06/08/2017   Procedure: MULTIPLE EXTRACTION WITH ALVEOLOPLASTY AND PRE PROSTHETIC SURGERY AS NEEDED;  Surgeon: Lenn Cal, DDS;  Location: [place];  Service: Oral Surgery;  Laterality: N/A;  . PERIPHERAL VASCULAR BALLOON ANGIOPLASTY  03/18/2019   Procedure: PERIPHERAL VASCULAR BALLOON ANGIOPLASTY;  Surgeon: Waynetta Sandy, MD;  Location: [place] [facility];  Service: Cardiovascular;;  RT PT  . PERIPHERAL VASCULAR INTERVENTION  03/18/2019   Procedure: PERIPHERAL VASCULAR INTERVENTION;  Surgeon: Waynetta Sandy, MD;  Location: [place] [facility];  Service: Cardiovascular;;  RT SFA  . PERIPHERAL VASCULAR INTERVENTION  05/02/2019   Procedure: PERIPHERAL VASCULAR INTERVENTION;  Surgeon: Marty Heck, MD;  Location: [place] [facility];  Service: Cardiovascular;;  Lt. SFA  . RIGHT HEART CATH N/A 06/07/2017   Procedure: RIGHT HEART CATH;  Surgeon: Larey Dresser, MD;  Location: [place] [facility];  Service: Cardiovascular;  Laterality: N/A;  . RIGHT HEART CATH N/A 02/08/2018   Procedure: RIGHT HEART CATH;  Surgeon: Larey Dresser, MD;  Location: [place] [facility];  Service:  Cardiovascular;  Laterality: N/A;  . RIGHT HEART CATH N/A 08/09/2018   Procedure: RIGHT HEART CATH;  Surgeon: Larey Dresser, MD;  Location: [place] [facility];  Service: Cardiovascular;  Laterality: N/A;  . RIGHT/LEFT HEART CATH AND CORONARY ANGIOGRAPHY N/A 11/23/2016   Procedure: Right/Left Heart Cath and Coronary Angiography;  Surgeon: Larey Dresser, MD;  Location: [place] [facility];  Service: Cardiovascular;  Laterality: N/A;  . TEE WITHOUT CARDIOVERSION N/A 06/12/2017   Procedure: TRANSESOPHAGEAL ECHOCARDIOGRAM (TEE);  Surgeon: Prescott Gum, Collier Salina, MD;  Location: [place];  Service: Open Heart Surgery;  Laterality: N/A;  . TEE WITHOUT CARDIOVERSION N/A 02/28/2018    Procedure: TRANSESOPHAGEAL ECHOCARDIOGRAM (TEE);  Surgeon: Prescott Gum, Collier Salina, MD;  Location: [place];  Service: Open Heart Surgery;  Laterality: N/A;  . TEMPORARY DIALYSIS CATHETER Left 05/24/2018   Procedure: TEMPORARY DIALYSIS CATHETER;  Surgeon: Marty Heck, MD;  Location: [place] [facility];  Service: Cardiovascular;  Laterality: Left;  . THROMBECTOMY AND REVISION OF ARTERIOVENTOUS (AV) GORETEX  GRAFT Right 12/12/2017   Procedure: THROMBECTOMY ARTERIOVENTOUS (AV) GORETEX  GRAFT RIGHT UPPER ARM;  Surgeon: Conrad Manson, MD;  Location: [place];  Service: Vascular;  Laterality: Right;  . TRANSMETATARSAL AMPUTATION Right 04/30/2019   Procedure: RIGHT TRANSMETATARSAL AMPUTATION;  Surgeon: Waynetta Sandy, MD;  Location: [facility] OR;  Service: Vascular;  Laterality: Right;   Family History  Problem Relation Age of Onset  . Colon polyps Mother   . Diabetes Mother   . Heart attack Father   . Diabetes Father   . Stroke Paternal Grandfather   . Colitis Sister   . Heart disease Brother   . Colitis Sister   . Colon cancer Neg Hx    Social History:  reports that he has never smoked. He has never used smokeless tobacco. He reports that he does not drink alcohol or use drugs. Allergies  Allergen Reactions  . Metformin And Related Diarrhea   Prior to Admission medications   Medication Sig Start Date End Date Taking? Authorizing Provider  acetaminophen (TYLENOL) 325 MG tablet Take 2 tablets (650 mg total) by mouth every 4 (four) hours as needed for headache or mild pain. 03/22/19  Yes Kroeger, Lorelee Cover., PA-C  aspirin EC 81 MG tablet Take 1 tablet (81 mg total) by mouth daily. 08/20/18  Yes Eileen Stanford, PA-C  atorvastatin (LIPITOR) 40 MG tablet Take 1 tablet (40 mg total) by mouth daily at 6 PM. 01/25/18  Yes [name], MD  calcitRIOL (ROCALTROL) 0.5 MCG capsule Take 1 capsule (0.5 mcg total) by mouth every Monday, Wednesday, and Friday. 06/06/18  Yes Georgiana Shore, NP   citalopram (CELEXA) 20 MG tablet Take 1 tablet (20 mg total) by mouth daily. 03/19/18  Yes Georgiana Shore, NP  docusate sodium (COLACE) 100 MG capsule Take 1 capsule (100 mg total) by mouth daily as needed for mild constipation. Patient taking differently: Take 100 mg by mouth daily.  03/22/19  Yes Kroeger, Daleen Snook M., PA-C  dronabinol (MARINOL) 2.5 MG capsule Take 1 capsule (2.5 mg total) by mouth 2 (two) times daily before lunch and supper. 03/27/18  Yes Larey Dresser, MD  erythromycin (ERYTHROCIN) 250 MG tablet Take 250 mg by mouth 3 (three) times daily.   Yes [provider]  gabapentin (NEURONTIN) 300 MG capsule Take 1 capsule (300 mg total) by mouth 2 (two) times daily. 04/16/19  Yes Larey Dresser, MD  insulin glargine (LANTUS) 100 UNIT/ML injection Inject  0.1 mLs (10 Units total) into the skin daily. 05/23/19  Yes Anderson, Chelsey L, DO  LORazepam (ATIVAN) 0.5 MG tablet Take 1 tablet (0.5 mg total) by mouth every 12 (twelve) hours as needed (nausea). 05/17/19  Yes Larey Dresser, MD  metoCLOPramide (REGLAN) 10 MG tablet Take 1 tablet (10 mg total) by mouth 3 (three) times daily. Patient taking differently: Take 10 mg by mouth 3 (three) times daily with meals.  01/22/18  Yes Georgiana Shore, NP  midodrine (PROAMATINE) 5 MG tablet Take 1 tablet (5 mg total) by mouth daily. Take 1 tablet before dialysis treatment Patient taking differently: Take 5 mg by mouth every Monday, Wednesday, and Friday. Take 1 tablet before dialysis treatment 04/04/19  Yes Clegg, Amy D, NP  multivitamin (RENA-VIT) TABS tablet Take 1 tablet by mouth daily.   Yes [provider]  NOVOLOG 100 UNIT/ML injection INJECT 7 UNITS INTO THE SKIN DAILY WITH SUPPER. Patient taking differently: Inject 7 Units into the skin daily with supper.  03/15/19  Yes Anderson, Chelsey L, DO  oxyCODONE (ROXICODONE) 5 MG immediate release tablet Take 1 tablet (5 mg total) by mouth 2 (two) times daily as needed for severe pain.  Take 1-2 tablets every 4-6 hours for severe pain. 05/21/19  Yes Larey Dresser, MD  pantoprazole (PROTONIX) 40 MG tablet Take 1 tablet (40 mg total) by mouth 2 (two) times daily before lunch and supper. 05/07/19  Yes Barrett, Evelene Croon, PA-C  promethazine (PHENERGAN) 12.5 MG tablet Take 1 tablet (12.5 mg total) by mouth every 6 (six) hours as needed for nausea or vomiting. 10/23/18  Yes Larey Dresser, MD  traMADol (ULTRAM) 50 MG tablet Take 1 tablet (50 mg total) by mouth every 6 (six) hours as needed for moderate pain. 05/17/19  Yes Larey Dresser, MD  warfarin (COUMADIN) 5 MG tablet TAKE AS FOLLOWS: MON-WED-FRI 1&1/2 TABLETS DAILY. ON ALL OTHER DAYS 1 TABLET Patient taking differently: Take 5 mg by mouth daily at 6 PM. TAKE 1.5 TABLETS (7.5 MG) BY MOUTH ON TUESDAYS & FRIDAYS; TAKE 1 TABLET (5 MG) BY MOUTH ON ALL OTHER DAYS. 09/12/18  Yes Larey Dresser, MD   Current Facility-Administered Medications  Medication Dose Route Frequency Provider Last Rate Last Dose  . acetaminophen (TYLENOL) tablet 650 mg  650 mg Oral Q4H PRN Clegg, Amy D, NP      . aspirin EC tablet 81 mg  81 mg Oral Daily Clegg, Amy D, NP      . atorvastatin (LIPITOR) tablet 40 mg  40 mg Oral q1800 Clegg, Amy D, NP      . Derrill Memo ON 05/29/2019] calcitRIOL (ROCALTROL) capsule 0.5 mcg  0.5 mcg Oral Q M,W,F Clegg, Amy D, NP      . Chlorhexidine Gluconate Cloth 2 % PADS 6 each  6 each Topical Daily Larey Dresser, MD   6 each at 05/28/19 1114  . citalopram (CELEXA) tablet 20 mg  20 mg Oral Daily Clegg, Amy D, NP      . docusate sodium (COLACE) capsule 100 mg  100 mg Oral Daily PRN Clegg, Amy D, NP      . dronabinol (MARINOL) capsule 2.5 mg  2.5 mg Oral BID AC Clegg, Amy D, NP      . erythromycin (ERYTHROCIN) tablet 250 mg  250 mg Oral TID Clegg, Amy D, NP      . gabapentin (NEURONTIN) capsule 300 mg  300 mg Oral BID Clegg, Amy D, NP      .  insulin aspart (novoLOG) injection 0-9 Units  0-9 Units Subcutaneous TID WC Clegg, Amy D, NP   5  Units at 05/28/19 1346  . insulin aspart (novoLOG) injection 7 Units  7 Units Subcutaneous Q supper Clegg, Amy D, NP      . insulin glargine (LANTUS) injection 10 Units  10 Units Subcutaneous Daily Clegg, Amy D, NP   10 Units at 05/28/19 1346  . LORazepam (ATIVAN) tablet 0.5 mg  0.5 mg Oral BID PRN Clegg, Amy D, NP      . metoCLOPramide (REGLAN) tablet 10 mg  10 mg Oral TID WC Clegg, Amy D, NP      . Derrill Memo ON 05/29/2019] midodrine (PROAMATINE) tablet 5 mg  5 mg Oral Q M,W,F Clegg, Amy D, NP      . ondansetron (ZOFRAN) injection 4 mg  4 mg Intravenous Q6H PRN Clegg, Amy D, NP      . oxyCODONE (Oxy IR/ROXICODONE) immediate release tablet 5 mg  5 mg Oral Q6H PRN Clegg, Amy D, NP      . pantoprazole (PROTONIX) EC tablet 40 mg  40 mg Oral BID AC Clegg, Amy D, NP      . promethazine (PHENERGAN) injection 12.5 mg  12.5 mg Intravenous Q6H PRN Larey Dresser, MD      . promethazine (PHENERGAN) tablet 12.5 mg  12.5 mg Oral Q6H PRN Clegg, Amy D, NP         ROS: As per HPI otherwise negative.  Physical Exam: Vitals:   05/28/19 1145 05/28/19 1150 05/28/19 1155 05/28/19 1200  BP: 120/89 (!) 118/94 (!) 109/91 (!) 110/92  Pulse: 93 93 93 93  Resp: 16 16 15 15   Temp:      TempSrc:      SpO2: 100% 99% 100% 98%     General: Chronically ill appearing NAD on nasal oxygen  Head: NCAT sclera not icteric MMM Neck: Supple. No JVD Lungs: CTAB. Breathing is unlabored. Heart: Mechanical hum of LVAD device  Abdomen: soft NT + BS Lower extremities: s/p R TMA, bandaged; Ischemic changes to L foot, great toe dusky  Neuro: Somnolent today. Reduced alertness.  Moves all extremities spontaneously. Dialysis Access: L IJ TDC Dsg c,d,i   Labs: Basic Metabolic Panel: No results for input(s): NA, K, CL, CO2, GLUCOSE, BUN, CREATININE, CALCIUM, PHOS in the last 168 hours.  Invalid input(s): ALB Liver Function Tests: No results for input(s): AST, ALT, ALKPHOS, BILITOT, PROT, ALBUMIN in the last 168 hours. No  results for input(s): LIPASE, AMYLASE in the last 168 hours. No results for input(s): AMMONIA in the last 168 hours. CBC: Recent Labs  Lab 05/28/19 1055  WBC 18.6*  NEUTROABS 15.5*  HGB 10.2*  HCT 32.0*  MCV 87.9  PLT 330   Cardiac Enzymes: No results for input(s): CKTOTAL, CKMB, CKMBINDEX, TROPONINI in the last 168 hours. CBG: Recent Labs  Lab 05/28/19 1253  GLUCAP 294*   Iron Studies: No results for input(s): IRON, TIBC, TRANSFERRIN, FERRITIN in the last 72 hours. Studies/Results: Dg Chest Port 1 View  Result Date: 05/28/2019 CLINICAL DATA:  Encounter for central line placement EXAM: PORTABLE CHEST - 1 VIEW COMPARISON:  05/02/2019 FINDINGS: Right IJ central line to the cavoatrial junction. Tunneled left IJ hemodialysis catheter tip near the cavoatrial junction. No pneumothorax. Relatively low lung volumes. Patchy coarse atelectasis or scarring at the right lung base as before. No new infiltrate or overt edema. Left subclavian AICD and LVAD are stable in position. Previous median sternotomy. Coronary  calcifications. Heart size within normal limits for technique. No effusion. IMPRESSION: 1. Central lines to the cavoatrial junction.  No pneumothorax. 2. Stable postop changes. 3. Stable right lower lobe scarring/atelectasis. Electronically Signed   By: [name] M.D.   On: 05/28/2019 12:35    Dialysis Orders:  [facility] MWF 4h437mn 400/800 EDW 84kg 2K/3Ca  No heparin bolus  Venofer 1080mIV x 5 until 9/28 Mircera 200 mcg IV q 2 weeks (last 9/16)  Hectorol 37m53mIV TIW    Assessment/Plan: 1. L great toe gangrene. Plan for left great toe amputation per VVS.  2. ESRD -  HD MWF. Continue on schedule. Next HD 9/23  3. Paroxysmal Afib - Heparin bridging per pharmacy  4. Hypertension/volume  - BP controlled. No volume excess on exam. UF to EDW as tolerated  5. Anemia  - Hgb 10.2. On ESA as outpatient.   6. Metabolic bone disease -  Continue VDRA/binders.  7. CAD/ICM s/p LVAD - per HF team    8. DM Type 2 - hx severe gastroparesis   OgeLynnda Child-C CarCenterger [phone]/2020, 2:25 PM    Nephrology attending: Patient was seen and examined at bedside.  Chart, labs and imaging studies reviewed.  I agree with her consult note  including assessment and plan as outlined.  53 3ar old male with CAD, ischemic cardiac disease with LVAD, [facility].  Plan for next dialysis tomorrow.  Resume home and dialysis medications.  DroLawson [facility].

## 2019-05-28 NOTE — Plan of Care (Signed)
  Problem: Education: Goal: Knowledge of General Education information will improve Description: Including pain rating scale, medication(s)/side effects and non-pharmacologic comfort measures Outcome: Progressing   Problem: Health Behavior/Discharge Planning: Goal: Ability to manage health-related needs will improve Outcome: Progressing   Problem: Clinical Measurements: Goal: Ability to maintain clinical measurements within normal limits will improve Outcome: Progressing Goal: Diagnostic test results will improve Outcome: Progressing Goal: Respiratory complications will improve Outcome: Progressing Goal: Cardiovascular complication will be avoided Outcome: Progressing

## 2019-05-28 NOTE — Progress Notes (Signed)
CSW acknowledges SNF consult for patient at discharge. Previous barriers to SNF for patient have included the only SNF to accept LVAD patients locally is [facility],  However [facility] is not in network with patient's [employer]. CSW reaching out to heart failure CSW to see if there are any other SNFs that accept LVADs and dialysis.   [place], [place]

## 2019-05-29 ENCOUNTER — Inpatient Hospital Stay (HOSPITAL_COMMUNITY): Payer: BC Managed Care – PPO | Admitting: Anesthesiology

## 2019-05-29 ENCOUNTER — Encounter (HOSPITAL_COMMUNITY)
Admission: RE | Disposition: A | Discharge status: Skilled Nursing Facility | Payer: Self-pay | Source: Home / Self Care | Attending: Cardiology

## 2019-05-29 ENCOUNTER — Encounter (HOSPITAL_COMMUNITY): Payer: Self-pay | Admitting: Orthopedic Surgery

## 2019-05-29 HISTORY — PX: AMPUTATION: SHX166

## 2019-05-29 LAB — CBC
HCT: 31.3 % — ABNORMAL LOW (ref 39.0–52.0)
Hemoglobin: 9.9 g/dL — ABNORMAL LOW (ref 13.0–17.0)
MCH: 27.9 pg (ref 26.0–34.0)
MCHC: 31.6 g/dL (ref 30.0–36.0)
MCV: 88.2 fL (ref 80.0–100.0)
Platelets: 307 10*3/uL (ref 150–400)
RBC: 3.55 MIL/uL — ABNORMAL LOW (ref 4.22–5.81)
RDW: 17.2 % — ABNORMAL HIGH (ref 11.5–15.5)
WBC: 19 10*3/uL — ABNORMAL HIGH (ref 4.0–10.5)
nRBC: 0.2 % (ref 0.0–0.2)

## 2019-05-29 LAB — GLUCOSE, CAPILLARY
Glucose-Capillary: 166 mg/dL — ABNORMAL HIGH (ref 70–99)
Glucose-Capillary: 229 mg/dL — ABNORMAL HIGH (ref 70–99)
Glucose-Capillary: 160 mg/dL — ABNORMAL HIGH (ref 70–99)

## 2019-05-29 LAB — POCT I-STAT, CHEM 8
BUN: 20 mg/dL (ref 6–20)
Calcium, Ion: 1.18 mmol/L (ref 1.15–1.40)
Chloride: 98 mmol/L (ref 98–111)
Creatinine, Ser: 4.4 mg/dL — ABNORMAL HIGH (ref 0.61–1.24)
Glucose, Bld: 167 mg/dL — ABNORMAL HIGH (ref 70–99)
HCT: 39 % (ref 39.0–52.0)
Hemoglobin: 13.3 g/dL (ref 13.0–17.0)
Potassium: 3.3 mmol/L — ABNORMAL LOW (ref 3.5–5.1)
Sodium: 137 mmol/L (ref 135–145)
TCO2: 24 mmol/L (ref 22–32)

## 2019-05-29 LAB — BASIC METABOLIC PANEL
Anion gap: 15 (ref 5–15)
BUN: 61 mg/dL — ABNORMAL HIGH (ref 6–20)
CO2: 22 mmol/L (ref 22–32)
Calcium: 8.9 mg/dL (ref 8.9–10.3)
Chloride: 93 mmol/L — ABNORMAL LOW (ref 98–111)
Creatinine, Ser: 11.01 mg/dL — ABNORMAL HIGH (ref 0.61–1.24)
GFR calc Af Amer: 5 mL/min — ABNORMAL LOW (ref >60)
GFR calc non Af Amer: 5 mL/min — ABNORMAL LOW (ref >60)
Glucose, Bld: 242 mg/dL — ABNORMAL HIGH (ref 70–99)
Potassium: 4.2 mmol/L (ref 3.5–5.1)
Sodium: 130 mmol/L — ABNORMAL LOW (ref 135–145)

## 2019-05-29 LAB — HIV ANTIBODY (ROUTINE TESTING W REFLEX): HIV Screen 4th Generation wRfx: NONREACTIVE

## 2019-05-29 LAB — LACTATE DEHYDROGENASE: LDH: 186 U/L (ref 98–192)

## 2019-05-29 LAB — PROTIME-INR
INR: 2.3 — ABNORMAL HIGH (ref 0.8–1.2)
Prothrombin Time: 25.1 seconds — ABNORMAL HIGH (ref 11.4–15.2)

## 2019-05-29 SURGERY — AMPUTATION DIGIT
Anesthesia: Monitor Anesthesia Care | Site: Toe | Laterality: Left

## 2019-05-29 MED ORDER — MIDAZOLAM HCL 2 MG/2ML IJ SOLN
INTRAMUSCULAR | Status: AC
  Filled 2019-05-29: qty 2

## 2019-05-29 MED ORDER — PROPOFOL 10 MG/ML IV BOLUS
INTRAVENOUS | Status: DC | PRN
  Administered 2019-05-29: 20 mg via INTRAVENOUS

## 2019-05-29 MED ORDER — HEPARIN SODIUM (PORCINE) 1000 UNIT/ML IJ SOLN
INTRAMUSCULAR | Status: AC
  Administered 2019-05-29: 3400 [IU] via INTRAVENOUS_CENTRAL
  Filled 2019-05-29: qty 4

## 2019-05-29 MED ORDER — PROPOFOL 500 MG/50ML IV EMUL
INTRAVENOUS | Status: DC | PRN
  Administered 2019-05-29: 50 ug/kg/min via INTRAVENOUS

## 2019-05-29 MED ORDER — LIDOCAINE HCL (PF) 1 % IJ SOLN: INTRAMUSCULAR | Status: DC | PRN

## 2019-05-29 MED ORDER — FENTANYL CITRATE (PF) 250 MCG/5ML IJ SOLN
INTRAMUSCULAR | Status: AC
  Filled 2019-05-29: qty 5

## 2019-05-29 MED ORDER — MIDAZOLAM HCL 5 MG/5ML IJ SOLN
INTRAMUSCULAR | Status: DC | PRN
  Administered 2019-05-29 (×2): 1 mg via INTRAVENOUS

## 2019-05-29 MED ORDER — ONDANSETRON HCL 4 MG/2ML IJ SOLN
INTRAMUSCULAR | Status: DC | PRN
  Administered 2019-05-29: 4 mg via INTRAVENOUS

## 2019-05-29 MED ORDER — DOXERCALCIFEROL 4 MCG/2ML IV SOLN
INTRAVENOUS | Status: AC
  Filled 2019-05-29: qty 2

## 2019-05-29 MED ORDER — LIDOCAINE HCL (PF) 1 % IJ SOLN
INTRAMUSCULAR | Status: AC
  Filled 2019-05-29: qty 30

## 2019-05-29 MED ORDER — BUPIVACAINE-EPINEPHRINE (PF) 0.5% -1:200000 IJ SOLN
INTRAMUSCULAR | Status: DC | PRN
  Administered 2019-05-29: 30 mL via PERINEURAL

## 2019-05-29 MED ORDER — PROPOFOL 10 MG/ML IV BOLUS
INTRAVENOUS | Status: AC
  Filled 2019-05-29: qty 20

## 2019-05-29 MED ORDER — 0.9 % SODIUM CHLORIDE (POUR BTL) OPTIME
TOPICAL | Status: DC | PRN
  Administered 2019-05-29: 1000 mL

## 2019-05-29 MED ORDER — FENTANYL CITRATE (PF) 100 MCG/2ML IJ SOLN
INTRAMUSCULAR | Status: DC | PRN
  Administered 2019-05-29 (×2): 50 ug via INTRAVENOUS

## 2019-05-29 MED ORDER — MIDODRINE HCL 5 MG PO TABS
ORAL_TABLET | ORAL | Status: AC
  Filled 2019-05-29: qty 1

## 2019-05-29 MED ORDER — SODIUM CHLORIDE 0.9 % IV SOLN
INTRAVENOUS | Status: DC | PRN
  Administered 2019-05-29: 13:00:00 20 ug/min via INTRAVENOUS

## 2019-05-29 MED ORDER — ALBUMIN HUMAN 5 % IV SOLN
INTRAVENOUS | Status: DC | PRN
  Administered 2019-05-29: 13:00:00 via INTRAVENOUS

## 2019-05-29 MED ORDER — SODIUM CHLORIDE 0.9 % IV SOLN
INTRAVENOUS | Status: DC
  Administered 2019-05-29: 12:00:00 via INTRAVENOUS

## 2019-05-29 SURGICAL SUPPLY — 33 items
BLADE AVERAGE 25X9 (BLADE) ×2 IMPLANT
BLADE SAW SGTL 81X20 HD (BLADE) IMPLANT
BNDG ELASTIC 4X5.8 VLCR STR LF (GAUZE/BANDAGES/DRESSINGS) ×3 IMPLANT
BNDG GAUZE ELAST 4 BULKY (GAUZE/BANDAGES/DRESSINGS) ×3 IMPLANT
CANISTER SUCT 3000ML PPV (MISCELLANEOUS) ×2 IMPLANT
COVER SURGICAL LIGHT HANDLE (MISCELLANEOUS) ×2 IMPLANT
COVER WAND RF STERILE (DRAPES) ×2 IMPLANT
DRAPE EXTREMITY T 121X128X90 (DISPOSABLE) ×2 IMPLANT
DRAPE HALF SHEET 40X57 (DRAPES) ×2 IMPLANT
DRSG EMULSION OIL 3X3 NADH (GAUZE/BANDAGES/DRESSINGS) ×2 IMPLANT
ELECT REM PT RETURN 9FT ADLT (ELECTROSURGICAL) ×2
ELECTRODE REM PT RTRN 9FT ADLT (ELECTROSURGICAL) ×1 IMPLANT
GAUZE SPONGE 4X4 12PLY STRL (GAUZE/BANDAGES/DRESSINGS) ×2 IMPLANT
GAUZE SPONGE 4X4 12PLY STRL LF (GAUZE/BANDAGES/DRESSINGS) ×2 IMPLANT
GLOVE BIOGEL PI IND STRL 7.5 (GLOVE) ×1 IMPLANT
GLOVE BIOGEL PI INDICATOR 7.5 (GLOVE) ×2
GLOVE SURG SS PI 7.5 STRL IVOR (GLOVE) ×2 IMPLANT
GOWN STRL REUS W/ TWL LRG LVL3 (GOWN DISPOSABLE) ×2 IMPLANT
GOWN STRL REUS W/ TWL XL LVL3 (GOWN DISPOSABLE) ×1 IMPLANT
GOWN STRL REUS W/TWL LRG LVL3 (GOWN DISPOSABLE) ×4
GOWN STRL REUS W/TWL XL LVL3 (GOWN DISPOSABLE) ×2
KIT BASIN OR (CUSTOM PROCEDURE TRAY) ×2 IMPLANT
KIT TURNOVER KIT B (KITS) ×2 IMPLANT
NDL HYPO 25GX1X1/2 BEV (NEEDLE) IMPLANT
NEEDLE HYPO 25GX1X1/2 BEV (NEEDLE) IMPLANT
NS IRRIG 1000ML POUR BTL (IV SOLUTION) ×2 IMPLANT
PACK GENERAL/GYN (CUSTOM PROCEDURE TRAY) ×2 IMPLANT
PAD ARMBOARD 7.5X6 YLW CONV (MISCELLANEOUS) ×4 IMPLANT
SUT ETHILON 3 0 PS 1 (SUTURE) ×2 IMPLANT
SYR CONTROL 10ML LL (SYRINGE) IMPLANT
TOWEL GREEN STERILE (TOWEL DISPOSABLE) ×4 IMPLANT
UNDERPAD 30X30 (UNDERPADS AND DIAPERS) ×2 IMPLANT
WATER STERILE IRR 1000ML POUR (IV SOLUTION) ×2 IMPLANT

## 2019-05-29 NOTE — Progress Notes (Signed)
OT Cancellation Note  Patient Details Name: [name] MRN: [id number] DOB: November 14, 1965   Cancelled Treatment:    Reason Eval/Treat Not Completed: Patient at procedure or test/ unavailable. Pt in HD. Will follow and see as able.   Delight Stare, OT Acute Rehabilitation Services Pager [phone] Office [phone]  Delight Stare 05/29/2019, 8:51 AM

## 2019-05-29 NOTE — Op Note (Signed)
    Patient name: [name] MRN: [id number] DOB: 1965/12/07 Sex: male  05/29/2019 Pre-operative Diagnosis: Left great toe ischemia Post-operative diagnosis:  Same Surgeon:  Annamarie Major Assistants: None Procedure:   Left great toe amputation including metatarsal head Anesthesia: Popliteal block Blood Loss: Minimal Specimens: Left great toe  Findings: Marginal capillary bleeding  Indications: The patient has developed progressive gangrene of the left great toe.  He comes in today for amputation.  Procedure:  The patient was identified in the holding area and taken to [place] 16  The patient was then placed supine on the table. Popliteal block anesthesia was administered.  The patient was prepped and draped in the usual sterile fashion.  A time out was called and antibiotics were administered.  A racquet type incision was made around the great toe.  Tissue was divided with a 10 blade down to the bone which was then transected with large bone cutters.  There was some capillary bleeding however this was not robust.  In order to get better skin closure, I ended up using a oscillating saw to resect the metatarsal head.  A rasp was used to smooth the bone surface.  The wound was irrigated.  There was no gross purulence identified.  The bone was healthy.  I then reapproximated the incision with interrupted 3-0 Vicryl sutures.  Sterile dressings were applied.   Disposition: To PACU stable.   Theotis Burrow, M.D., [facility] [place] Office: [phone] Pager:  [phone]

## 2019-05-29 NOTE — Procedures (Signed)
Patient was seen on dialysis and the procedure was supervised.  BFR 400  Via AVF BP is  110/46. 2k bath.    Patient appears to be tolerating treatment well.  [name] 05/29/2019

## 2019-05-29 NOTE — Anesthesia Preprocedure Evaluation (Signed)
Anesthesia Evaluation  Patient identified by MRN, date of birth, ID band Patient awake    Reviewed: Allergy & Precautions, H&P , NPO status , Patient's Chart, lab work & pertinent test results  Airway Mallampati: II   Neck ROM: full    Dental   Pulmonary shortness of breath,    breath sounds clear to auscultation       Cardiovascular hypertension, + angina + CAD, + Past MI, + Peripheral Vascular Disease and +CHF  + Cardiac Defibrillator  Rhythm:regular Rate:Normal  LVAD in place   Neuro/Psych    GI/Hepatic GERD  ,  Endo/Other  diabetes  Renal/GU ESRF and DialysisRenal disease     Musculoskeletal   Abdominal   Peds  Hematology   Anesthesia Other Findings   Reproductive/Obstetrics                             Anesthesia Physical Anesthesia Plan  ASA: IV  Anesthesia Plan: MAC and Regional   Post-op Pain Management:  Regional for Post-op pain   Induction: Intravenous  PONV Risk Score and Plan: 1 and Ondansetron, Midazolam and Treatment may vary due to age or medical condition  Airway Management Planned: Simple Face Mask  Additional Equipment:   Intra-op Plan:   Post-operative Plan:   Informed Consent: I have reviewed the patients History and Physical, chart, labs and discussed the procedure including the risks, benefits and alternatives for the proposed anesthesia with the patient or authorized representative who has indicated his/her understanding and acceptance.       Plan Discussed with: CRNA, Anesthesiologist and Surgeon  Anesthesia Plan Comments:         Anesthesia Quick Evaluation

## 2019-05-29 NOTE — Progress Notes (Addendum)
[facility] Progress Note   Subjective:  Seen in HD unit. UF goal 2.5L Tolerating. Good spirits this am. Denies CP, SOB, N,V,  Objective Vitals:   05/29/19 0717 05/29/19 0722 05/29/19 0730 05/29/19 0800  BP: (!) 111/55 108/60 99/73 (!) 110/46  Pulse: 83 84 84 87  Resp: 17 19 16  (!) 22  Temp:      TempSrc:      SpO2:      Weight:        Physical Exam General: Chronically ill appearing NAD  Heart:  Mechanical hum of LVAD device  Lungs: CTAB  Abdomen: soft NTND  Extremities: s/p R TMA bandaged, ichemic changes to L foot, great toe dusky   Dialysis Access: L IJ TDC    Weight change:    Additional Objective Labs: Basic Metabolic Panel: Recent Labs  Lab 05/29/19 0426  NA 130*  K 4.2  CL 93*  CO2 22  GLUCOSE 242*  BUN 61*  CREATININE 11.01*  CALCIUM 8.9   CBC: Recent Labs  Lab 05/28/19 1055 05/29/19 0426  WBC 18.6* 19.0*  NEUTROABS 15.5*  --   HGB 10.2* 9.9*  HCT 32.0* 31.3*  MCV 87.9 88.2  PLT 330 307   Blood Culture    Component Value Date/Time   SDES BLOOD RIGHT HAND 05/02/2019 1445   SPECREQUEST  05/02/2019 1445    BOTTLES DRAWN AEROBIC ONLY Blood Culture adequate volume   CULT  05/02/2019 1445    NO GROWTH 5 DAYS Performed at [facility], [place] [address] [place], [place] [address]    REPTSTATUS 05/07/2019 FINAL 05/02/2019 1445     Medications: . sodium chloride    . sodium chloride    . sodium chloride    .  ceFAZolin (ANCEF) IV     . aspirin EC  81 mg Oral Daily  . atorvastatin  40 mg Oral q1800  . chlorhexidine  60 mL Topical Once  . Chlorhexidine Gluconate Cloth  6 each Topical Daily  . citalopram  20 mg Oral Daily  . doxercalciferol  2 mcg Intravenous Q M,W,F-HD  . dronabinol  2.5 mg Oral BID AC  . erythromycin  250 mg Oral TID  . gabapentin  300 mg Oral BID  . insulin aspart  0-9 Units Subcutaneous TID WC  . insulin aspart  7 Units Subcutaneous Q supper  . insulin glargine  10 Units Subcutaneous Daily  .  metoCLOPramide  10 mg Oral TID WC  . midodrine      . midodrine  5 mg Oral Q M,W,F  . multivitamin  1 tablet Oral QHS  . pantoprazole  40 mg Oral BID AC  . sevelamer carbonate  2,400 mg Oral TID WC    Dialysis Orders:  [facility] MWF 4h67mn 400/800 EDW 84kg 2K/3Ca  No heparin bolus  Venofer 1070mIV x 5 until 9/28 Mircera 200 mcg IV q 2 weeks (last 9/16)  Hectorol 28m27mIV TIW    Assessment/Plan: 1. L great toe gangrene. Plan for left great toe amputation per VVS.  2. ESRD -  HD MWF. HD today on schedule.  3. Paroxysmal Afib - Coumadin on hold for surgery. Per pharmacy.  4. Hypertension/volume  - BP controlled. No volume excess on exam. UF to EDW as tolerated  5. Anemia  - Hgb 10.2>9.9 . On ESA as outpatient.    6. Metabolic bone disease -  Continue VDRA/binders.  7. CAD/ICM s/p LVAD - per HF team   8.  DM Type 2 - hx severe gastroparesis -  Lynnda Child PA-C [facility] Pager [phone] 05/29/2019,8:24 AM  LOS: 1 day   Nephrology attending: Patient was seen and examined.  Chart reviewed.  I agree with progress note above including assessment and plan as outlined. Plan for great toe amputation on hold because of high INR.  ESRD on HD, receiving dialysis today.  Tolerating well.  Continue current medication.   Lawson Radar, MD [facility].

## 2019-05-29 NOTE — Progress Notes (Signed)
VAD Coordinator Procedure Note: VAD Coordinator paged to Short Stay room 36. Pt undergoing great left toe amputation per Dr. Trula Slade. Hemodynamics and VAD parameters monitored by myself and anesthesia throughout the procedure. Blood pressures were obtained with automatic cuff on left arm and correlated with Doppler.    Time: Doppler Auto  BP Flow PI Power Speed  Pre-procedure:           12:15 100 99/69 (76) 4.3 4.5 4.7 5500           Secation Induction:          13:00  116/100  (107) 4.2 5.1 4.2    13:10 100 92/54 (68) 4.5 3.4 4.5    13:20  84/73 (79) 4.5 3.7 4.5    13:30  98/78 (86) 4.5 3.4 4.4   Recovery Area:          13:40  125/96  (115) 4.1 5.1 4.7    14:00  117/97 (105) 4.2 4.5 4.9     Patient tolerated the procedure well. VAD Coordinator accompanied and remained with patient in recovery area.    Patient Disposition: 2C09.  Zada Girt RN, [place] Coordinator [phone]

## 2019-05-29 NOTE — Anesthesia Procedure Notes (Signed)
Anesthesia Regional Block: Popliteal block   Pre-Anesthetic Checklist: ,, timeout performed, Correct Patient, Correct Site, Correct Laterality, Correct Procedure, Correct Position, site marked, Risks and benefits discussed,  Surgical consent,  Pre-op evaluation,  At surgeon's request and post-op pain management  Laterality: Left  Prep: chloraprep       Needles:  Injection technique: Single-shot  Needle Type: Echogenic Stimulator Needle          Additional Needles:   Procedures:, nerve stimulator,,,,,,,   Nerve Stimulator or Paresthesia:  Response: plantar flexion of foot, 0.45 mA,   Additional Responses:   Narrative:  Start time: 05/29/2019 12:40 PM End time: 05/29/2019 12:45 PM Injection made incrementally with aspirations every 5 mL.  Performed by: Personally  Anesthesiologist: Albertha Ghee, MD  Additional Notes: Functioning IV was confirmed and monitors were applied.  A 59m 21ga Arrow echogenic stimulator needle was used. Sterile prep and drape,hand hygiene and sterile gloves were used.  Negative aspiration and negative test dose prior to incremental administration of local anesthetic. The patient tolerated the procedure well.  Ultrasound guidance: relevent anatomy identified, needle position confirmed, local anesthetic spread visualized around nerve(s), vascular puncture avoided.  Image printed for medical record.

## 2019-05-29 NOTE — Transfer of Care (Signed)
Immediate Anesthesia Transfer of Care Note  Patient: [name]  Procedure(s) Performed: AMPUTATION LEFT GREAT TOE (Left Toe)  Patient Location: PACU  Anesthesia Type:MAC combined with regional for post-op pain  Level of Consciousness: awake, alert  and oriented  Airway & Oxygen Therapy: Patient Spontanous Breathing and Patient connected to nasal cannula oxygen  Post-op Assessment: Report given to RN and Post -op Vital signs reviewed and stable  Post vital signs: Reviewed and stable  Last Vitals:  Vitals Value Taken Time  BP 125/96 05/29/19 1341  Temp    Pulse 106 05/29/19 1345  Resp 21 05/29/19 1345  SpO2 96 % 05/29/19 1345  Vitals shown include unvalidated device data.  Last Pain:  Vitals:   05/29/19 1117  TempSrc: Oral  PainSc: 0-No pain         Complications: No apparent anesthesia complications

## 2019-05-29 NOTE — Progress Notes (Signed)
Pt in hemodialysis as endorsed.

## 2019-05-29 NOTE — Progress Notes (Signed)
Report taken from HD RN, who claimed that pt is going straight  to the OR from HD .

## 2019-05-29 NOTE — Anesthesia Procedure Notes (Deleted)
Performed by: Marsa Aris, CRNA

## 2019-05-29 NOTE — Progress Notes (Signed)
CSW following up with [facility], [facility], [facility] who take [place] and who transport patients to dialysis to see if they are able to make a bed offers.   [facility] has declined as they report they are not taking any more dialysis patients at this time.   [facility] reports they have done LVADs in the past and have completed training. They are reviewing patient's chart at this time.   [place], [place]

## 2019-05-29 NOTE — Progress Notes (Signed)
PT Cancellation Note  Patient Details Name: NISHAWN ROTAN MRN: [id number] DOB: September 17, 1965   Cancelled Treatment:    Reason Eval/Treat Not Completed: Patient at procedure or test/unavailable(Pt in HD.  Will check back later as able.  )   Denice Paradise 05/29/2019, 7:54 AM  [place] Pager:  [phone]  Office:  [phone]

## 2019-05-29 NOTE — Progress Notes (Signed)
Back from PACU  By bed awake and alert. Dressing to right and left foot dry and intact Denied any discomfort.

## 2019-05-29 NOTE — Progress Notes (Signed)
ANTICOAGULATION CONSULT NOTE - Follow Up Consult  Pharmacy Consult for warfarin  Indication: LVAD HM3  Allergies  Allergen Reactions  . Metformin And Related Diarrhea    Patient Measurements: Weight: 186 lb 15.2 oz (84.8 kg)   Vital Signs: Temp: 98.4 F (36.9 C) (09/23 1615) Temp Source: Oral (09/23 1117) BP: 110/99 (09/23 1630) Pulse Rate: 101 (09/23 1630)  Labs: Recent Labs    05/28/19 1025  05/28/19 1055 05/29/19 0426 05/29/19 1200  HGB  --    < > 10.2* 9.9* 13.3  HCT  --   --  32.0* 31.3* 39.0  PLT  --   --  330 307  --   LABPROT 26.7*  --   --  25.1*  --   INR 2.5*  --   --  2.3*  --   CREATININE  --   --   --  11.01* 4.40*   < > = values in this interval not displayed.    Estimated Creatinine Clearance: 20.4 mL/min (A) (by C-G formula based on SCr of 4.4 mg/dL (H)).   Medical History: Past Medical History:  Diagnosis Date  . Angina   . ASCVD (arteriosclerotic cardiovascular disease)    , Anterior infarction 2005, LAD diagonal bifurcation intervention 03/2004  . Automatic implantable cardiac defibrillator -St. Jude's       . Benign neoplasm of colon   . CHF (congestive heart failure) ([place])   . Chronic systolic heart failure ([place])   . Coronary artery disease     Widely patent previously placed stents in the left anterior   . Crohn's disease ([place])   . Deep venous thrombosis ([facility])    Recurrent-on Coumadin  . Dialysis patient ([place])   . Dyspnea   . Gastroparesis   . GERD (gastroesophageal reflux disease)   . High cholesterol   . Hyperlipidemia   . Hypersomnolent    Previous diagnosis of narcolepsy  . Hypertension, essential   . Ischemic cardiomyopathy    Ejection fraction 15-20% catheterization 2010  . Type II or unspecified type diabetes mellitus without mention of complication, not stated as uncontrolled   . Unspecified gastritis and gastroduodenitis without mention of hemorrhage      Assessment: 76yom with LVAD on warfarin PTA admitted for  toe amputation dt PAD/ischemia.   INR last week in clinic 3.9 - warfarin has been on hold since thur INR on admit 2.5 > down to 2.3 today  f/u post op for need for heparin for [facility] or restart warfarin.   ESRD MWF  PTA warfarin 7.58m Tu/Fr 556mQOD  Goal of Therapy:  INR 2-3 Monitor platelets by anticoagulation protocol: Yes   Plan:  No warfarin for now  Daily Protime   LiBonnita Nasutiharm.D. CPP, BCPS Clinical Pharmacist [phone]/23/2020 4:57 PM

## 2019-05-29 NOTE — Interval H&P Note (Signed)
History and Physical Interval Note:  05/29/2019 12:29 PM  [name]  has presented today for surgery, with the diagnosis of GANGRENE LEFT GREAT TOE.  The various methods of treatment have been discussed with the patient and family. After consideration of risks, benefits and other options for treatment, the patient has consented to  Procedure(s): AMPUTATION LEFT GREAT TOE (Left) as a surgical intervention.  The patient's history has been reviewed, patient examined, no change in status, stable for surgery.  I have reviewed the patient's chart and labs.  Questions were answered to the patient's satisfaction.     Annamarie Major

## 2019-05-29 NOTE — Anesthesia Procedure Notes (Signed)
Procedure Name: MAC Date/Time: 05/29/2019 12:48 PM Performed by: Marsa Aris, CRNA Pre-anesthesia Checklist: Patient identified, Emergency Drugs available, Suction available, Timeout performed and Patient being monitored Patient Re-evaluated:Patient Re-evaluated prior to induction Oxygen Delivery Method: Simple face mask Placement Confirmation: positive ETCO2 Dental Injury: Teeth and Oropharynx as per pre-operative assessment

## 2019-05-29 NOTE — Progress Notes (Addendum)
Patient ID: [name], male   DOB: 08-Apr-1966, 53 y.o.   MRN: [id number]   Advanced Heart Failure VAD Team Note  PCP-Cardiologist: No primary care provider on file.   Subjective:    Patient seen at HD today.  Still having a lot of right foot pain.  Left great toe does not hurt.  MAP 90s.   INR 2.3 today.   LVAD INTERROGATION:  HeartMate 3 LVAD:   Flow 4 liters/min, speed 5500, power 4.8, PI 4.3.    Objective:    Vital Signs:   Temp:  [98.3 F (36.8 C)-99.9 F (37.7 C)] 98.9 F (37.2 C) (09/23 0705) Pulse Rate:  [83-102] 92 (09/23 0900) Resp:  [11-23] 15 (09/23 0845) BP: (93-120)/(46-96) 103/64 (09/23 0900) SpO2:  [92 %-100 %] 98 % (09/23 0705) Weight:  [78.3 kg-84.8 kg] 84.8 kg (09/23 0705) Last BM Date: 05/28/19 Mean arterial Pressure 90s  Intake/Output:   Intake/Output Summary (Last 24 hours) at 05/29/2019 0931 Last data filed at 05/28/2019 1347 Gross per 24 hour  Intake 200 ml  Output -  Net 200 ml     Physical Exam    General:  Well appearing. No resp difficulty HEENT: normal Neck: supple. JVP . Carotids 2+ bilat; no bruits. No lymphadenopathy or thyromegaly appreciated. Cor: Mechanical heart sounds with LVAD hum present. Lungs: clear Abdomen: soft, nontender, nondistended. No hepatosplenomegaly. No bruits or masses. Good bowel sounds. Driveline: C/D/I; securement device intact and driveline incorporated Extremities: S/p right TMA, left great toe gangrenous.  Neuro: alert & orientedx3, cranial nerves grossly intact. moves all 4 extremities w/o difficulty. Affect pleasant   Telemetry   NSR 90s (personally reviewed).   Labs   Basic Metabolic Panel: Recent Labs  Lab 05/29/19 0426  NA 130*  K 4.2  CL 93*  CO2 22  GLUCOSE 242*  BUN 61*  CREATININE 11.01*  CALCIUM 8.9    Liver Function Tests: No results for input(s): AST, ALT, ALKPHOS, BILITOT, PROT, ALBUMIN in the last 168 hours. No results for input(s): LIPASE, AMYLASE in the last 168  hours. No results for input(s): AMMONIA in the last 168 hours.  CBC: Recent Labs  Lab 05/28/19 1055 05/29/19 0426  WBC 18.6* 19.0*  NEUTROABS 15.5*  --   HGB 10.2* 9.9*  HCT 32.0* 31.3*  MCV 87.9 88.2  PLT 330 307    INR: Recent Labs  Lab 05/24/19 05/28/19 1025 05/29/19 0426  INR 3.9* 2.5* 2.3*    Other results:  EKG:    Imaging   Dg Chest Port 1 View  Result Date: 05/28/2019 CLINICAL DATA:  Encounter for central line placement EXAM: PORTABLE CHEST - 1 VIEW COMPARISON:  05/02/2019 FINDINGS: Right IJ central line to the cavoatrial junction. Tunneled left IJ hemodialysis catheter tip near the cavoatrial junction. No pneumothorax. Relatively low lung volumes. Patchy coarse atelectasis or scarring at the right lung base as before. No new infiltrate or overt edema. Left subclavian AICD and LVAD are stable in position. Previous median sternotomy. Coronary calcifications. Heart size within normal limits for technique. No effusion. IMPRESSION: 1. Central lines to the cavoatrial junction.  No pneumothorax. 2. Stable postop changes. 3. Stable right lower lobe scarring/atelectasis. Electronically Signed   By: [name] M.D.   On: 05/28/2019 12:35      Medications:     Scheduled Medications: . aspirin EC  81 mg Oral Daily  . atorvastatin  40 mg Oral q1800  . chlorhexidine  60 mL Topical Once  . Chlorhexidine  Gluconate Cloth  6 each Topical Daily  . citalopram  20 mg Oral Daily  . doxercalciferol  2 mcg Intravenous Q M,W,F-HD  . dronabinol  2.5 mg Oral BID AC  . erythromycin  250 mg Oral TID  . gabapentin  300 mg Oral BID  . insulin aspart  0-9 Units Subcutaneous TID WC  . insulin aspart  7 Units Subcutaneous Q supper  . insulin glargine  10 Units Subcutaneous Daily  . metoCLOPramide  10 mg Oral TID WC  . midodrine  5 mg Oral Q M,W,F  . multivitamin  1 tablet Oral QHS  . pantoprazole  40 mg Oral BID AC  . sevelamer carbonate  2,400 mg Oral TID WC     Infusions: .  sodium chloride    . sodium chloride    . sodium chloride    .  ceFAZolin (ANCEF) IV       PRN Medications:  sodium chloride, sodium chloride, acetaminophen, alteplase, docusate sodium, heparin, lidocaine (PF), lidocaine-prilocaine, LORazepam, ondansetron (ZOFRAN) IV, oxyCODONE, pentafluoroprop-tetrafluoroeth, promethazine, promethazine   Assessment/Plan:    1. Ischemic feet/ left great toe gangrene/PAD: Admitted for scheduled left great toe amputation by Dr Trula Slade.  Also still with considerable right foot pain and unable to bear weight. Last dose of coumadin was 9/18.  - INR 2.3 today, may have to delay procedure, will try to contact vascular and find out.  - Need to readdress right foot, ?ongoing ischemia => will he need BKA?  Finding a rehab facility for him post-op is going to be a significant problem.   2. Chronic systolic CHF: Ischemic cardiomyopathy. St Jude ICD. NSTEMI in March 2018 with DES to LAD and RCA, complicated by cardiogenic shock, low output requiring milrinone. Echo 8/18 with EF 20-25%, moderate MR. CPX (8/18) with severe functional impairment due to HF. He is s/p HM3 LVAD placement. He had pump thrombosis 6/19 due to [facility] outflow graft kinking. He had pump exchange for another HM3. Not a candidate for transplant with PAD. Volume status managed at HD.  - Continue ASA 81. Off coumadin for surgery.   - Continue hydralazine 25 mg tid on non-HD days.  3. ESRD: HD on M-W-F with midodrine pre-HD. Getting HD this morning.  4.CAD: NSTEMI in 3/18. LHC with 99% ulcerated lesion proximal RCA with left to right collaterals, 95% mid LAD stenosis after mid LAD stent. s/p PCI to RCA and LAD on 11/25/16.No chest pain.  - Continue statin and ASA. 5. h/o DVTs: Factor V Leiden heterozygote. -Anticoagulated. 6. Diabetic gastroparesis: He has seen gastroparesis specialist at [facility]. Surprisingly, gastric emptying study was normal. However, electrogastrogram showed poor  gastric accomodation/capacity. Therefore, small frequent meals recommended. - Continue reglan 10 mg tid/ac and Marinol.  - Continue Protonix bid.  - He will continue erythromycinindefinitely.  - Limit narcotic use as much as possible. 7. YFR:TMYTRZ today.  8. Pain: Considerable right foot pain as above and unable to bear weight.  Need re-evaluation by vascular, ?does he need right BKA.  9. ID: WBCs have been elevated chronically recently, suspect related to ischemic feet.  Afebrile.  No antibiotics for now.   Need LVAD social worker involvement to figure out living situation for Mr Pettie after discharge, I do not think that he is going to be able to go directly home again.   I reviewed the LVAD parameters from today, and compared the results to the patient's prior recorded data.  No programming changes were made.  The LVAD is  functioning within specified parameters.  The patient performs LVAD self-test daily.  LVAD interrogation was negative for any significant power changes, alarms or PI events/speed drops.  LVAD equipment check completed and is in good working order.  Back-up equipment present.   LVAD education done on emergency procedures and precautions and reviewed exit site care.  Length of Stay: 1  Loralie Champagne, MD 05/29/2019, 9:31 AM  VAD Team --- VAD ISSUES ONLY--- Pager [phone] (7am - 7am)  Advanced Heart Failure Team  Pager [phone] (M-F; 7a - 4p)  Please contact [facility] for night-coverage after hours (4p -7a ) and weekends on [email or web address]

## 2019-05-29 NOTE — Plan of Care (Signed)

## 2019-05-30 ENCOUNTER — Encounter (HOSPITAL_COMMUNITY): Payer: Self-pay | Admitting: Surgery

## 2019-05-30 LAB — CBC
HCT: 28.9 % — ABNORMAL LOW (ref 39.0–52.0)
Hemoglobin: 8.6 g/dL — ABNORMAL LOW (ref 13.0–17.0)
MCH: 27 pg (ref 26.0–34.0)
MCHC: 29.8 g/dL — ABNORMAL LOW (ref 30.0–36.0)
MCV: 90.6 fL (ref 80.0–100.0)
Platelets: 284 10*3/uL (ref 150–400)
RBC: 3.19 MIL/uL — ABNORMAL LOW (ref 4.22–5.81)
RDW: 17.2 % — ABNORMAL HIGH (ref 11.5–15.5)
WBC: 17.9 10*3/uL — ABNORMAL HIGH (ref 4.0–10.5)
nRBC: 0.2 % (ref 0.0–0.2)

## 2019-05-30 LAB — PROTIME-INR
INR: 1.9 — ABNORMAL HIGH (ref 0.8–1.2)
Prothrombin Time: 21.7 seconds — ABNORMAL HIGH (ref 11.4–15.2)

## 2019-05-30 LAB — GLUCOSE, CAPILLARY
Glucose-Capillary: 100 mg/dL — ABNORMAL HIGH (ref 70–99)
Glucose-Capillary: 229 mg/dL — ABNORMAL HIGH (ref 70–99)
Glucose-Capillary: 178 mg/dL — ABNORMAL HIGH (ref 70–99)
Glucose-Capillary: 118 mg/dL — ABNORMAL HIGH (ref 70–99)

## 2019-05-30 LAB — BASIC METABOLIC PANEL
Anion gap: 15 (ref 5–15)
BUN: 37 mg/dL — ABNORMAL HIGH (ref 6–20)
CO2: 22 mmol/L (ref 22–32)
Calcium: 8.7 mg/dL — ABNORMAL LOW (ref 8.9–10.3)
Chloride: 93 mmol/L — ABNORMAL LOW (ref 98–111)
Creatinine, Ser: 7.39 mg/dL — ABNORMAL HIGH (ref 0.61–1.24)
GFR calc Af Amer: 9 mL/min — ABNORMAL LOW (ref >60)
GFR calc non Af Amer: 8 mL/min — ABNORMAL LOW (ref >60)
Glucose, Bld: 148 mg/dL — ABNORMAL HIGH (ref 70–99)
Potassium: 3.7 mmol/L (ref 3.5–5.1)
Sodium: 130 mmol/L — ABNORMAL LOW (ref 135–145)

## 2019-05-30 LAB — LACTATE DEHYDROGENASE: LDH: 156 U/L (ref 98–192)

## 2019-05-30 LAB — HEPARIN LEVEL (UNFRACTIONATED): Heparin Unfractionated: 0.25 IU/mL — ABNORMAL LOW (ref 0.30–0.70)

## 2019-05-30 MED ORDER — INFLUENZA VAC SPLIT QUAD 0.5 ML IM SUSY: 0.5000 mL | PREFILLED_SYRINGE | INTRAMUSCULAR | Status: DC

## 2019-05-30 MED ORDER — SODIUM CHLORIDE 0.9 % IV SOLN
INTRAVENOUS | Status: DC | PRN
  Administered 2019-05-30: 11:00:00 250 mL via INTRAVENOUS

## 2019-05-30 MED ORDER — HEPARIN (PORCINE) 25000 UT/250ML-% IV SOLN
1350.0000 [IU]/h | INTRAVENOUS | Status: DC
  Administered 2019-05-30: 10:00:00 1200 [IU]/h via INTRAVENOUS
  Administered 2019-05-31 – 2019-06-01 (×3): 1350 [IU]/h via INTRAVENOUS
  Filled 2019-05-30 (×3): qty 250

## 2019-05-30 MED ORDER — VANCOMYCIN HCL 10 G IV SOLR
1750.0000 mg | Freq: Once | INTRAVENOUS | Status: AC
  Administered 2019-05-30: 11:00:00 1750 mg via INTRAVENOUS
  Filled 2019-05-30: qty 1750

## 2019-05-30 MED ORDER — CHLORHEXIDINE GLUCONATE CLOTH 2 % EX PADS
6.0000 | MEDICATED_PAD | Freq: Every day | CUTANEOUS | Status: DC
  Administered 2019-05-31 – 2019-06-02 (×3): 6 via TOPICAL

## 2019-05-30 MED ORDER — SODIUM CHLORIDE 0.9 % IV SOLN
INTRAVENOUS | Status: DC | PRN
  Administered 2019-05-30: 10:00:00 250 mL via INTRAVENOUS
  Administered 2019-06-02: 08:00:00 via INTRAVENOUS

## 2019-05-30 MED ORDER — FENTANYL CITRATE (PF) 100 MCG/2ML IJ SOLN
25.0000 ug | Freq: Once | INTRAMUSCULAR | Status: AC
  Administered 2019-05-30: 25 ug via INTRAVENOUS
  Filled 2019-05-30: qty 2

## 2019-05-30 MED ORDER — VANCOMYCIN HCL IN DEXTROSE 1-5 GM/200ML-% IV SOLN
1000.0000 mg | INTRAVENOUS | Status: DC
  Administered 2019-05-31 – 2019-06-07 (×4): 1000 mg via INTRAVENOUS
  Filled 2019-05-30 (×4): qty 200

## 2019-05-30 MED ORDER — PIPERACILLIN-TAZOBACTAM 3.375 G IVPB
3.3750 g | Freq: Two times a day (BID) | INTRAVENOUS | Status: DC
  Administered 2019-05-30 – 2019-05-31 (×4): 3.375 g via INTRAVENOUS
  Filled 2019-05-30 (×5): qty 50

## 2019-05-30 NOTE — Progress Notes (Signed)
[name] PROGRESS NOTE  Assessment/ Plan: Pt is a 53 y.o. yo male ESRD on HD, CAD, severe ICM status post LVAD, DM, A. fib admitted for amputation of left great toe.  Dialysis Orders: [facility] MWF 4h64mn 400/800 EDW 84kg 2K/3Ca  No heparin bolus  Venofer 1038mIV x 5 until 9/28 Mircera 200 mcg IV q 2 weeks (last 9/16)  Hectorol 73m81mIV TIW  # L great toe gangrene: Status post left great toe amputation including metatarsal head on 9/23.  # ESRD -HD MWF.   Status post HD yesterday with UF around 1029 cc, tolerated well.  Plan for next HD tomorrow.   # Hypertension/volume - BP controlled. No volume excess on exam.   # Anemia -drop in hemoglobin likely due to blood loss during surgery . On ESA as outpatient, last Mircera on 9/16.   # Metabolic bone disease -Continue VDRA/binders.   # CAD/ICM s/p LVAD - per HF team   Subjective: Seen and examined at bedside.  Tolerated dialysis well yesterday and had amputation surgery.  He denies nausea, vomiting, chest pain, shortness of breath. Objective Vital signs in last 24 hours: Vitals:   05/29/19 2329 05/30/19 0340 05/30/19 0519 05/30/19 0830  BP: 100/88 103/79  112/85  Pulse: (!) 102 96  97  Resp: 12 15  16   Temp:  98.1 F (36.7 C)  98 F (36.7 C)  TempSrc:  Oral  Oral  SpO2: 97% 98%  99%  Weight:   84.8 kg    Weight change: 6.5 kg  Intake/Output Summary (Last 24 hours) at 05/30/2019 1013 Last data filed at 05/30/2019 0830 Gross per 24 hour  Intake 810 ml  Output 1079 ml  Net -269 ml       Labs: Basic Metabolic Panel: Recent Labs  Lab 05/29/19 0426 05/29/19 1200 05/30/19 0426  NA 130* 137 130*  K 4.2 3.3* 3.7  CL 93* 98 93*  CO2 22  --  22  GLUCOSE 242* 167* 148*  BUN 61* 20 37*  CREATININE 11.01* 4.40* 7.39*  CALCIUM 8.9  --  8.7*   Liver Function Tests: No results for input(s): AST, ALT, ALKPHOS, BILITOT, PROT, ALBUMIN in the last 168 hours. No results for input(s):  LIPASE, AMYLASE in the last 168 hours. No results for input(s): AMMONIA in the last 168 hours. CBC: Recent Labs  Lab 05/28/19 1055 05/29/19 0426 05/29/19 1200 05/30/19 0426  WBC 18.6* 19.0*  --  17.9*  NEUTROABS 15.5*  --   --   --   HGB 10.2* 9.9* 13.3 8.6*  HCT 32.0* 31.3* 39.0 28.9*  MCV 87.9 88.2  --  90.6  PLT 330 307  --  284   Cardiac Enzymes: No results for input(s): CKTOTAL, CKMB, CKMBINDEX, TROPONINI in the last 168 hours. CBG: Recent Labs  Lab 05/28/19 2109 05/29/19 0609 05/29/19 1624 05/29/19 2112 05/30/19 0622  GLUCAP 148* 166* 229* 160* 100*    Iron Studies: No results for input(s): IRON, TIBC, TRANSFERRIN, FERRITIN in the last 72 hours. Studies/Results: Dg Chest Port 1 View  Result Date: 05/28/2019 CLINICAL DATA:  Encounter for central line placement EXAM: PORTABLE CHEST - 1 VIEW COMPARISON:  05/02/2019 FINDINGS: Right IJ central line to the cavoatrial junction. Tunneled left IJ hemodialysis catheter tip near the cavoatrial junction. No pneumothorax. Relatively low lung volumes. Patchy coarse atelectasis or scarring at the right lung base as before. No new infiltrate or overt edema. Left subclavian AICD and LVAD are stable in position.  Previous median sternotomy. Coronary calcifications. Heart size within normal limits for technique. No effusion. IMPRESSION: 1. Central lines to the cavoatrial junction.  No pneumothorax. 2. Stable postop changes. 3. Stable right lower lobe scarring/atelectasis. Electronically Signed   By: [name] M.D.   On: 05/28/2019 12:35    Medications: Infusions: . heparin    . piperacillin-tazobactam (ZOSYN)  IV    . vancomycin    . [START ON 05/31/2019] vancomycin      Scheduled Medications: . aspirin EC  81 mg Oral Daily  . atorvastatin  40 mg Oral q1800  . Chlorhexidine Gluconate Cloth  6 each Topical Daily  . citalopram  20 mg Oral Daily  . doxercalciferol  2 mcg Intravenous Q M,W,F-HD  . dronabinol  2.5 mg Oral BID AC  .  erythromycin  250 mg Oral TID  . gabapentin  300 mg Oral BID  . insulin aspart  0-9 Units Subcutaneous TID WC  . insulin aspart  7 Units Subcutaneous Q supper  . insulin glargine  10 Units Subcutaneous Daily  . metoCLOPramide  10 mg Oral TID WC  . midodrine  5 mg Oral Q M,W,F  . multivitamin  1 tablet Oral QHS  . pantoprazole  40 mg Oral BID AC  . sevelamer carbonate  2,400 mg Oral TID WC    have reviewed scheduled and prn medications.  Physical Exam: General:NAD, comfortable Heart: Mechanical sound of LVAD device Lungs:clear b/l, no crackle Abdomen:soft, Non-tender, non-distended Extremities:No edema, dressing applied to the amputation site Dialysis Access: Left IJ TDC  Myrl Bynum Prasad Lemuel Boodram 05/30/2019,10:13 AM  LOS: 2 days  Pager: [phone]

## 2019-05-30 NOTE — Progress Notes (Signed)
Pharmacy Antibiotic Note  [name] is a 53 y.o. male admitted on 05/28/2019 with foot ischemia now s/p amputation. Pharmacy has been consulted for vancomycin and Zosyn dosing with concern over infection at surgical sites. Pt is ESRD on HD MWF (last HD 9/23).  Plan: Vancomycin 1775m IV x1 then 10033mIV qHD Zosyn 3.375g IV q12h  Weight: 186 lb 15.2 oz (84.8 kg)  Temp (24hrs), Avg:98.2 F (36.8 C), Min:97.2 F (36.2 C), Max:99.8 F (37.7 C)  Recent Labs  Lab 05/28/19 1055 05/29/19 0426 05/29/19 1200 05/30/19 0426  WBC 18.6* 19.0*  --  17.9*  CREATININE  --  11.01* 4.40* 7.39*    Estimated Creatinine Clearance: 12.1 mL/min (A) (by C-G formula based on SCr of 7.39 mg/dL (H)).    Allergies  Allergen Reactions  . Metformin And Related Diarrhea    Antimicrobials this admission: Vancomycin 9/24 >> Zosyn 9/24 >>  Dose adjustments this admission: none  Microbiology results: none  Thank you for allowing pharmacy to be a part of this patient's care.  MiArrie SenatePharmD, BCPS Clinical Pharmacist [phone] check AMION for all [facility] 05/30/2019

## 2019-05-30 NOTE — Progress Notes (Signed)
ANTICOAGULATION CONSULT NOTE - Follow Up Consult  Pharmacy Consult for warfarin to heparin Indication: LVAD HM3  Allergies  Allergen Reactions  . Metformin And Related Diarrhea    Patient Measurements: Weight: 186 lb 15.2 oz (84.8 kg)   Vital Signs: Temp: 98 F (36.7 C) (09/24 0830) Temp Source: Oral (09/24 0830) BP: 112/85 (09/24 0830) Pulse Rate: 97 (09/24 0830)  Labs: Recent Labs    05/28/19 1025  05/28/19 1055 05/29/19 0426 05/29/19 1200 05/30/19 0426  HGB  --    < > 10.2* 9.9* 13.3 8.6*  HCT  --    < > 32.0* 31.3* 39.0 28.9*  PLT  --   --  330 307  --  284  LABPROT 26.7*  --   --  25.1*  --  21.7*  INR 2.5*  --   --  2.3*  --  1.9*  CREATININE  --   --   --  11.01* 4.40* 7.39*   < > = values in this interval not displayed.    Estimated Creatinine Clearance: 12.1 mL/min (A) (by C-G formula based on SCr of 7.39 mg/dL (H)).   Medical History: Past Medical History:  Diagnosis Date  . Angina   . ASCVD (arteriosclerotic cardiovascular disease)    , Anterior infarction 2005, LAD diagonal bifurcation intervention 03/2004  . Automatic implantable cardiac defibrillator -St. Jude's       . Benign neoplasm of colon   . CHF (congestive heart failure) ([place])   . Chronic systolic heart failure ([place])   . Coronary artery disease     Widely patent previously placed stents in the left anterior   . Crohn's disease ([place])   . Deep venous thrombosis (H[facility])    Recurrent-on Coumadin  . Dialysis patient ([place])   . Dyspnea   . Gastroparesis   . GERD (gastroesophageal reflux disease)   . High cholesterol   . Hyperlipidemia   . Hypersomnolent    Previous diagnosis of narcolepsy  . Hypertension, essential   . Ischemic cardiomyopathy    Ejection fraction 15-20% catheterization 2010  . Type II or unspecified type diabetes mellitus without mention of complication, not stated as uncontrolled   . Unspecified gastritis and gastroduodenitis without mention of hemorrhage       Assessment: [name] on warfarin PTA for LVAD and hx FV Leiden admitted for ongoing foot ischemia. Pt now s/p toe amputation so warfarin held, INR down to 1.9, LDH stable. May need further intervention by VVS so will continue to hold warfarin and begin heparin gtt.  PTA warfarin 7.49m Tu/Fr 5105mQOD  Goal of Therapy:  INR 2-3  Heparin Level 0.3-0.7 Monitor platelets by anticoagulation protocol: Yes   Plan:  -Continue to hold warfarin -Begin heparin 1200 units/hr - check 8hr heparin level   MiArrie SenatePharmD, BCPS Clinical Pharmacist [phone] check AMION for all [facility] 05/30/2019

## 2019-05-30 NOTE — Plan of Care (Signed)

## 2019-05-30 NOTE — Progress Notes (Signed)
LVAD Coordinator Rounding Note:  Admitted 05/28/19 per Dr. Aundra Dubin for heparin bridge for left toe amputation.    HM III LVAD implanted on 06/12/17 by Dr. Darcey Nora under Destination Therapy criteria. Pump exchange performed on 03/01/18 per Dr. Darcey Nora for pump thrombus.   Pt lying in bed asleep this am.   Vital signs: Temp: 98.5 HR: 83 Doppler Pressure: 94 Automatic BP: 112/85 (95) O2 Sat: 97% RA  Wt: 184>186.9 lbs   LVAD interrogation reveals:  Speed: 5500 Flow: 4.1 Power: 4.5w PI: 4.0 Alarms: none Events:  5 PI events  Hematocrit: 29  Fixed speed: 5500 Low speed limit: 5200   Drive Line: Dressing C/D/I; anchor intact and accurately applied. Weekly dressing changes per bedside nurse; next dressing change due 06/03/19.  Labs:  LDH trend: 200>186>156  INR trend: 2.5>2.3>1.9  Anticoagulation Plan: -INR Goal: 2.0 - 3.0 -ASA Dose: 81 mg daily   Device: St Jude dual ICD - Therapies: on 200 bpm - Last check: 01/01/18 - 2 hrs Afl/Afib; EMI 04/02/18   Infection:  - BC x 2 on 05/30/19>>pending  Plan/Recommendations:  1. Call VAD pager if any questions about VAD equipment or drive line. 2. Possible right BKA this admission; VAD Coordinator will accompany patient to surgery.  Zada Girt RN, [place] Coordinator [phone]

## 2019-05-30 NOTE — Progress Notes (Signed)
Patient ID: [name], male   DOB: Dec 14, 1965, 53 y.o.   MRN: [id number]   Advanced Heart Failure VAD Team Note  PCP-Cardiologist: No primary care provider on file.   Subjective:    Left great toe amputated yesterday.  No pain in left foot but right foot still very painful.  Afebrile, WBCs 17.9.    INR 1.9 today.   LVAD INTERROGATION:  HeartMate 3 LVAD:   Flow 3.9 liters/min, speed 5500, power 4.6, PI 5.4.    Objective:    Vital Signs:   Temp:  [97.2 F (36.2 C)-99.8 F (37.7 C)] 98 F (36.7 C) (09/24 0830) Pulse Rate:  [52-111] 97 (09/24 0830) Resp:  [10-22] 16 (09/24 0830) BP: (83-125)/(42-99) 112/85 (09/24 0830) SpO2:  [67 %-100 %] 99 % (09/24 0830) Weight:  [83.5 kg-84.8 kg] 84.8 kg (09/24 0519) Last BM Date: 05/29/19 Mean arterial Pressure 90s  Intake/Output:   Intake/Output Summary (Last 24 hours) at 05/30/2019 0946 Last data filed at 05/30/2019 0830 Gross per 24 hour  Intake 810 ml  Output 1079 ml  Net -269 ml     Physical Exam    General: Well appearing this am. NAD.  HEENT: Normal. Neck: Supple, JVP 7-8 cm. Carotids OK.  Cardiac:  Mechanical heart sounds with LVAD hum present.  Lungs:  CTAB, normal effort.  Abdomen:  NT, ND, no HSM. No bruits or masses. +BS  LVAD exit site: Well-healed and incorporated. Dressing dry and intact. No erythema or drainage. Stabilization device present and accurately applied. Driveline dressing changed daily per sterile technique. Extremities:  Status post right TMA (dressed) and left great toe amputation.   Neuro:  Alert & oriented x 3. Cranial nerves grossly intact. Moves all 4 extremities w/o difficulty. Affect pleasant     Telemetry   NSR 90s (personally reviewed).   Labs   Basic Metabolic Panel: Recent Labs  Lab 05/29/19 0426 05/29/19 1200 05/30/19 0426  NA 130* 137 130*  K 4.2 3.3* 3.7  CL 93* 98 93*  CO2 22  --  22  GLUCOSE 242* 167* 148*  BUN 61* 20 37*  CREATININE 11.01* 4.40* 7.39*  CALCIUM  8.9  --  8.7*    Liver Function Tests: No results for input(s): AST, ALT, ALKPHOS, BILITOT, PROT, ALBUMIN in the last 168 hours. No results for input(s): LIPASE, AMYLASE in the last 168 hours. No results for input(s): AMMONIA in the last 168 hours.  CBC: Recent Labs  Lab 05/28/19 1055 05/29/19 0426 05/29/19 1200 05/30/19 0426  WBC 18.6* 19.0*  --  17.9*  NEUTROABS 15.5*  --   --   --   HGB 10.2* 9.9* 13.3 8.6*  HCT 32.0* 31.3* 39.0 28.9*  MCV 87.9 88.2  --  90.6  PLT 330 307  --  284    INR: Recent Labs  Lab 05/24/19 05/28/19 1025 05/29/19 0426 05/30/19 0426  INR 3.9* 2.5* 2.3* 1.9*    Other results:  EKG:    Imaging   Dg Chest Port 1 View  Result Date: 05/28/2019 CLINICAL DATA:  Encounter for central line placement EXAM: PORTABLE CHEST - 1 VIEW COMPARISON:  05/02/2019 FINDINGS: Right IJ central line to the cavoatrial junction. Tunneled left IJ hemodialysis catheter tip near the cavoatrial junction. No pneumothorax. Relatively low lung volumes. Patchy coarse atelectasis or scarring at the right lung base as before. No new infiltrate or overt edema. Left subclavian AICD and LVAD are stable in position. Previous median sternotomy. Coronary calcifications. Heart size within  normal limits for technique. No effusion. IMPRESSION: 1. Central lines to the cavoatrial junction.  No pneumothorax. 2. Stable postop changes. 3. Stable right lower lobe scarring/atelectasis. Electronically Signed   By: [name] M.D.   On: 05/28/2019 12:35     Medications:     Scheduled Medications: . aspirin EC  81 mg Oral Daily  . atorvastatin  40 mg Oral q1800  . Chlorhexidine Gluconate Cloth  6 each Topical Daily  . citalopram  20 mg Oral Daily  . doxercalciferol  2 mcg Intravenous Q M,W,F-HD  . dronabinol  2.5 mg Oral BID AC  . erythromycin  250 mg Oral TID  . gabapentin  300 mg Oral BID  . insulin aspart  0-9 Units Subcutaneous TID WC  . insulin aspart  7 Units Subcutaneous Q supper   . insulin glargine  10 Units Subcutaneous Daily  . metoCLOPramide  10 mg Oral TID WC  . midodrine  5 mg Oral Q M,W,F  . multivitamin  1 tablet Oral QHS  . pantoprazole  40 mg Oral BID AC  . sevelamer carbonate  2,400 mg Oral TID WC    Infusions: . heparin    . piperacillin-tazobactam (ZOSYN)  IV    . vancomycin    . [START ON 05/31/2019] vancomycin      PRN Medications: acetaminophen, docusate sodium, LORazepam, ondansetron (ZOFRAN) IV, oxyCODONE, promethazine, promethazine   Assessment/Plan:    1. Ischemic feet/ left great toe gangrene/PAD: Now s/p left great toe amputation by Dr Trula Slade.  Also still with considerable right foot pain and unable to bear weight. The right TMA site is not healing well and is very painful.  I think he will need right BKA.  - Vascular to assess right foot and comment on timing of right BKA.   2. Chronic systolic CHF: Ischemic cardiomyopathy. St Jude ICD. NSTEMI in March 2018 with DES to LAD and RCA, complicated by cardiogenic shock, low output requiring milrinone. Echo 8/18 with EF 20-25%, moderate MR. CPX (8/18) with severe functional impairment due to HF. He is s/p HM3 LVAD placement. He had pump thrombosis 6/19 due to [facility] outflow graft kinking. He had pump exchange for another HM3. Not a candidate for transplant with PAD. Volume status managed at HD.  - Continue ASA 81. Off coumadin for surgery.  Will start heparin gtt with INR < 2.  - Continue hydralazine 25 mg tid on non-HD days.  3. ESRD: HD on M-W-F with midodrine pre-HD. Getting HD this morning.  4.CAD: NSTEMI in 3/18. LHC with 99% ulcerated lesion proximal RCA with left to right collaterals, 95% mid LAD stenosis after mid LAD stent. s/p PCI to RCA and LAD on 11/25/16.No chest pain.  - Continue statin and ASA. 5. h/o DVTs: Factor V Leiden heterozygote. -Anticoagulated. 6. Diabetic gastroparesis: He has seen gastroparesis specialist at [facility]. Surprisingly, gastric emptying study  was normal. However, electrogastrogram showed poor gastric accomodation/capacity. Therefore, small frequent meals recommended. - Continue reglan 10 mg tid/ac and Marinol.  - Continue Protonix bid.  - He will continue erythromycinindefinitely.  - Limit narcotic use as much as possible. 7. ZOX:WRUEAV today.  8. Pain: Considerable right foot pain as above and unable to bear weight.   9. ID: WBCs have been elevated chronically recently, suspect related to ischemic right foot, not healing well post-TMA.  Afebrile.   - Send blood cultures.  - Will start empiric vanc/Zosyn for now.   Need LVAD social worker involvement to figure out living  situation for Mr Denzer after discharge, I do not think that he is going to be able to go directly home again.   I reviewed the LVAD parameters from today, and compared the results to the patient's prior recorded data.  No programming changes were made.  The LVAD is functioning within specified parameters.  The patient performs LVAD self-test daily.  LVAD interrogation was negative for any significant power changes, alarms or PI events/speed drops.  LVAD equipment check completed and is in good working order.  Back-up equipment present.   LVAD education done on emergency procedures and precautions and reviewed exit site care.  Length of Stay: 2  Loralie Champagne, MD 05/30/2019, 9:46 AM  VAD Team --- VAD ISSUES ONLY--- Pager [phone] (7am - 7am)  Advanced Heart Failure Team  Pager [phone] (M-F; 7a - 4p)  Please contact [facility] for night-coverage after hours (4p -7a ) and weekends on [email or web address]

## 2019-05-30 NOTE — Anesthesia Postprocedure Evaluation (Signed)
Anesthesia Post Note  Patient: [name]  Procedure(s) Performed: AMPUTATION LEFT GREAT TOE (Left Toe)     Patient location during evaluation: PACU Anesthesia Type: Regional and MAC Level of consciousness: awake and alert Pain management: pain level controlled Vital Signs Assessment: post-procedure vital signs reviewed and stable Respiratory status: spontaneous breathing, nonlabored ventilation, respiratory function stable and patient connected to nasal cannula oxygen Cardiovascular status: stable and blood pressure returned to baseline Postop Assessment: no apparent nausea or vomiting Anesthetic complications: no    Last Vitals:  Vitals:   05/29/19 2329 05/30/19 0340  BP: 100/88 103/79  Pulse: (!) 102 96  Resp: 12 15  Temp:  36.7 C  SpO2: 97% 98%    Last Pain:  Vitals:   05/30/19 0340  TempSrc: Oral  PainSc: [place]

## 2019-05-30 NOTE — Progress Notes (Addendum)
ANTICOAGULATION CONSULT NOTE - Follow Up Consult  Pharmacy Consult for warfarin to heparin Indication: LVAD HM3  Allergies  Allergen Reactions  . Metformin And Related Diarrhea    Patient Measurements: Height: 5' 8"  (172.7 cm) Weight: 186 lb 15.2 oz (84.8 kg) IBW/kg (Calculated) : 68.4   Vital Signs: Temp: 98.5 F (36.9 C) (09/24 1626) Temp Source: Oral (09/24 1626) BP: 93/73 (09/24 1626) Pulse Rate: 89 (09/24 1626)  Labs: Recent Labs    05/28/19 1025  05/28/19 1055 05/29/19 0426 05/29/19 1200 05/30/19 0426 05/30/19 1800  HGB  --    < > 10.2* 9.9* 13.3 8.6*  --   HCT  --    < > 32.0* 31.3* 39.0 28.9*  --   PLT  --   --  330 307  --  284  --   LABPROT 26.7*  --   --  25.1*  --  21.7*  --   INR 2.5*  --   --  2.3*  --  1.9*  --   HEPARINUNFRC  --   --   --   --   --   --  0.25*  CREATININE  --   --   --  11.01* 4.40* 7.39*  --    < > = values in this interval not displayed.    Estimated Creatinine Clearance: 12.1 mL/min (A) (by C-G formula based on SCr of 7.39 mg/dL (H)).   Medical History: Past Medical History:  Diagnosis Date  . Angina   . ASCVD (arteriosclerotic cardiovascular disease)    , Anterior infarction 2005, LAD diagonal bifurcation intervention 03/2004  . Automatic implantable cardiac defibrillator -St. Jude's       . Benign neoplasm of colon   . CHF (congestive heart failure) ([place])   . Chronic systolic heart failure ([place])   . Coronary artery disease     Widely patent previously placed stents in the left anterior   . Crohn's disease ([place])   . Deep venous thrombosis (H[facility])    Recurrent-on Coumadin  . Dialysis patient ([place])   . Dyspnea   . Gastroparesis   . GERD (gastroesophageal reflux disease)   . High cholesterol   . Hyperlipidemia   . Hypersomnolent    Previous diagnosis of narcolepsy  . Hypertension, essential   . Ischemic cardiomyopathy    Ejection fraction 15-20% catheterization 2010  . Type II or unspecified type diabetes mellitus  without mention of complication, not stated as uncontrolled   . Unspecified gastritis and gastroduodenitis without mention of hemorrhage      Assessment: 70 yoM on warfarin PTA for LVAD and hx FV Leiden admitted for ongoing foot ischemia. Pt now s/p toe amputation so warfarin held, INR down to 1.9, LDH stable. May need further intervention by VVS so will continue heparin and hold warfarin for now.  PTA warfarin 7.63m Tu/Fr 53mQOD  Goal of Therapy:  INR 2-3  Heparin Level 0.3-0.7 Monitor platelets by anticoagulation protocol: Yes   Plan:  Increase heparin to 1350 units/hr Recheck heparin level in am  FrErin HearingharmD., BCPS Clinical Pharmacist 05/30/2019 6:30 PM

## 2019-05-30 NOTE — Progress Notes (Addendum)
Progress Note    05/30/2019 8:33 AM 1 Day Post-Op  Subjective:  Says he's having pain in his right foot; denies any pain in his left foot.  Tells me he can't get a transplant with his active leg issues.   Afebrile HR 90's-100's ST 283'M-629'U systolic 76% RA  Vitals:   05/29/19 2329 05/30/19 0340  BP: 100/88 103/79  Pulse: (!) 102 96  Resp: 12 15  Temp:  98.1 F (36.7 C)  SpO2: 97% 98%    Physical Exam: Cardiac:  regular Lungs:  Non labored Incisions:    Right TMA 05/30/2019   Left great toe amp site POD 1 05/30/2019    Extremities:  Brisk doppler signals right PT/peroneal and faint peroneal; the right TMA has progressed significantly since I saw him in the office last week with new eschar on dorsum of foot.  Left great toe amp site is clean and there was some bloody drainage on the dressing. There is brisk doppler flow left peroneal artery and flow in the left DP.   CBC    Component Value Date/Time   WBC 17.9 (H) 05/30/2019 0426   RBC 3.19 (L) 05/30/2019 0426   HGB 8.6 (L) 05/30/2019 0426   HGB 11.7 (L) 09/07/2016 1457   HCT 28.9 (L) 05/30/2019 0426   HCT 37.5 09/07/2016 1457   PLT 284 05/30/2019 0426   PLT 279 09/07/2016 1457   MCV 90.6 05/30/2019 0426   MCV 81 09/07/2016 1457   MCH 27.0 05/30/2019 0426   MCHC 29.8 (L) 05/30/2019 0426   RDW 17.2 (H) 05/30/2019 0426   RDW 14.9 09/07/2016 1457   LYMPHSABS 1.0 05/28/2019 1055   LYMPHSABS 1.3 09/07/2016 1457   MONOABS 1.5 (H) 05/28/2019 1055   EOSABS 0.4 05/28/2019 1055   EOSABS 0.2 09/07/2016 1457   BASOSABS 0.1 05/28/2019 1055   BASOSABS 0.0 09/07/2016 1457    BMET    Component Value Date/Time   NA 130 (L) 05/30/2019 0426   NA 144 05/11/2017 0938   K 3.7 05/30/2019 0426   CL 93 (L) 05/30/2019 0426   CO2 22 05/30/2019 0426   GLUCOSE 148 (H) 05/30/2019 0426   BUN 37 (H) 05/30/2019 0426   BUN 68 (H) 05/11/2017 0938   CREATININE 7.39 (H) 05/30/2019 0426   CREATININE 1.63 (H) 11/09/2016 1505   CALCIUM 8.7 (L) 05/30/2019 0426   CALCIUM 8.7 06/27/2017 1152   GFRNONAA 8 (L) 05/30/2019 0426   GFRNONAA 48 (L) 11/09/2016 1505   GFRAA 9 (L) 05/30/2019 0426   GFRAA 55 (L) 11/09/2016 1505    INR    Component Value Date/Time   INR 1.9 (H) 05/30/2019 0426     Intake/Output Summary (Last 24 hours) at 05/30/2019 5465 Last data filed at 05/29/2019 2200 Gross per 24 hour  Intake 570 ml  Output 1079 ml  Net -509 ml     Assessment:  53 y.o. male is s/p:  Left great toe amputation and hx of right TMA  1 Day Post-Op  Plan: Left foot: -left great toe amp site looks good and he has a brisk peroneal doppler signal, which is better than when I saw him in the office last week.  There is also a DP doppler signal present.   Right Foot: Right TMA has significantly worsened since I saw him in the office last week.  He is most likely going to require right BKA.  He does have brisk doppler flow in the right foot and hopeful he will  heal a more proximal amputation.  Will start him on broad spectrum abx given how his right foot has progressed.  -DVT prophylaxis:  Heparin gtt-would hold restarting coumadin at this time bc he is most likely going to require a right below knee amputation as his right TMA is not healing despite brisk doppler signals.  I discussed with him that he is at risk of his left toe amp not healing as well but will have to give it time.   Dr. Trula Slade to see pt later today.   Leontine Locket, PA-C [facility] [phone] 05/30/2019 8:33 AM  I agree with the above.  Have seen and evaluated patient.  I do not think his right transmetatarsal amputation will heal and he is going to need a below-knee amputation.  He would like to minimize the trips he takes the operating room so I would give the left great toe amputation site a chance to heal before going to the operating room to make sure he does not need anything done to his left leg.  Annamarie Major

## 2019-05-30 NOTE — Evaluation (Signed)
Physical Therapy Evaluation Patient Details Name: [name] MRN: [id number] DOB: [birth year] Today's Date: 05/30/2019   History of Present Illness  This is a 53 y.o. male with hx of LVAD, ESRD, DM, CAD, HLD who is s/p right TMA on 04/30/2019 by Dr. Donzetta Matters for non healing amp site.  He had previously undergone ray amp of right 4th toes on 03/21/2019 by Dr. Donzetta Matters. He was seen 05/21/2019 by Dr. Aundra Dubin and was having difficulty walking due to pain in his right foot and is unable to put any weight on this foot.  He had an audible doppler signal per note.  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  Clinical Impression  Pt admitted with above diagnosis. Pt was able to laterally scoot to chair with min assist and cues.  Pain in right foot limits pt considerably.  He was unable to put weight on the right foot during the transfer.  Overall pt did well with transfer. May have another amputation therefore will continue to follow and update needs as they arise.  Will most likely need SNF for rehab at d/c.   Pt currently with functional limitations due to the deficits listed below (see PT Problem List). Pt will benefit from skilled PT to increase their independence and safety with mobility to allow discharge to the venue listed below.      Follow Up Recommendations SNF;Supervision/Assistance - 24 hour    Equipment Recommendations  None recommended by PT    Recommendations for Other Services       Precautions / Restrictions Precautions Precautions: Fall;Other (comment) Precaution Comments: LVAD Required Braces or Orthoses: Other Brace Other Brace: Darco shoe--Right and left foot Restrictions Weight Bearing Restrictions: Yes RLE Weight Bearing: (heel weight bearing bilaterally) Other Position/Activity Restrictions:  pt to weight bear on bil  heels with Darco shoes      Mobility  Bed Mobility Overal bed mobility: Needs Assistance Bed Mobility: Supine to Sit     Supine to sit: Min  assist     General bed mobility comments: had to pull up on PT due to pain right LE, pt extending right LE straight out so he could come to EOB and assist to scoot out to EOB.  Incr time needed due to pain.   Transfers Overall transfer level: Needs assistance Equipment used: Rolling walker (2 wheeled) Transfers: Lateral/Scoot Transfers          Lateral/Scoot Transfers: Min assist General transfer comment: Pt reports there is no way he can stand on his right foot as it was hurting too bad and he was premedicated.  Decided to scoot to drop arm recliner to pts left and donned Darco shoe on left foot per MD approval.  Pt said he couldnt weight bear on right LE due to pain therefore he held it in full knee extension with PT assisting pt to hold it up.  Pt was able to slowly scoot to recliner using UEs and pt scooted to recliner with cues and PT holding right LE up.  Good use of UEs to scoot.  Incr time needed but overall good transfer to chair.  MD to order another Darco shoe for pt when he can weight bear.   Ambulation/Gait             General Gait Details: unable  Stairs            Wheelchair Mobility    Modified Rankin (Stroke Patients Only)       Balance Overall  balance assessment: Needs assistance Sitting-balance support: No upper extremity supported;Feet supported Sitting balance-[name]: Poor Sitting balance - Comments: Sitting at EOB needed min assist due to pain in right LE with pt with posterior lean                                     Pertinent Vitals/Pain Pain Assessment: 0-10 Faces Pain Scale: Hurts worst Pain Location: Right foot Pain Descriptors / Indicators: Grimacing;Guarding;Aching;Sharp Pain Intervention(s): Limited activity within patient's tolerance;Monitored during session;Repositioned;Premedicated before session;Patient requesting pain meds-RN notified    Home Living Family/patient expects to be discharged to:: Private  residence Living Arrangements: Children Available Help at Discharge: Family;Available 24 hours/day Type of Home: House Home Access: Stairs to enter Entrance Stairs-Rails: None Entrance Stairs-Number of Steps: 2 Home Layout: Multi-level;Able to live on main level with bedroom/bathroom Home Equipment: Kasandra Knudsen - single point;Walker - 4 wheels;Walker - 2 wheels;Wheelchair - manual(Hurrycane) Additional Comments: pt lives with son and daughter, one of which is with him all the time. His bedroom is downstairs but the kids have moved him to the main level so he will only have to do 2 STE    Prior Function Level of Independence: Needs assistance   Gait / Transfers Assistance Needed: Pt was ambulating short distances with RW with min guard assist after last admit  ADL's / Homemaking Assistance Needed: ADLs with sons assist sponge bathes due to LVAD  Comments: uses RW     Hand Dominance   Dominant Hand: Right    Extremity/Trunk Assessment   Upper Extremity Assessment Upper Extremity Assessment: Defer to OT evaluation    Lower Extremity Assessment Lower Extremity Assessment: RLE deficits/detail;LLE deficits/detail RLE Deficits / Details: recent transmet amputation and limited by pain, appears grossly 3-/5 LLE Deficits / Details: left great toe amputation, limited by pain, appears grossly 3-/5    Cervical / Trunk Assessment Cervical / Trunk Assessment: Other exceptions Cervical / Trunk Exceptions: Reports lower back pain   Communication   Communication: No difficulties  Cognition Arousal/Alertness: Awake/alert Behavior During Therapy: Flat affect Overall Cognitive Status: Impaired/Different from baseline Area of Impairment: Following commands;Safety/judgement;Problem solving                       Following Commands: Follows one step commands consistently;Follows multi-step commands inconsistently Safety/Judgement: Decreased awareness of safety;Decreased awareness of  deficits   Problem Solving: Slow processing;Decreased initiation        General Comments      Exercises General Exercises - Lower Extremity Ankle Circles/Pumps: AROM;Both;Seated;5 reps Long Arc Quad: AROM;Both;10 reps;Seated   Assessment/Plan    PT Assessment Patient needs continued PT services  PT Problem List Decreased strength;Decreased activity tolerance;Decreased balance;Decreased mobility;Decreased knowledge of use of DME       PT Treatment Interventions DME instruction;Gait training;Stair training;Functional mobility training;Therapeutic activities;Therapeutic exercise;Patient/family education    PT Goals (Current goals can be found in the Care Plan section)  Acute Rehab PT Goals Patient Stated Goal: to go home  PT Goal Formulation: With patient Time For Goal Achievement: 06/13/19 Potential to Achieve Goals: Good    Frequency Min 3X/week   Barriers to discharge        Co-evaluation               AM-PAC PT "6 Clicks" Mobility  Outcome Measure Help needed turning from your back to your side while in a flat bed without  using bedrails?: None Help needed moving from lying on your back to sitting on the side of a flat bed without using bedrails?: A Little Help needed moving to and from a bed to a chair (including a wheelchair)?: A Little Help needed standing up from a chair using your arms (e.g., wheelchair or bedside chair)?: Total Help needed to walk in hospital room?: Total Help needed climbing 3-5 steps with a railing? : Total 6 Click Score: 13    End of Session Equipment Utilized During Treatment: Gait belt Activity Tolerance: Patient tolerated treatment well Patient left: with call bell/phone within reach;in chair;with chair alarm set Nurse Communication: Mobility status;Patient requests pain meds PT Visit Diagnosis: Other abnormalities of gait and mobility (R26.89);Pain;Unsteadiness on feet (R26.81) Pain - Right/Left: Right Pain - part of body:  Ankle and joints of foot    Time: 3893-7342 PT Time Calculation (min) (ACUTE ONLY): 28 min   Charges:   PT Evaluation $PT Eval Moderate Complexity: 1 Mod PT Treatments $Therapeutic Activity: 8-22 mins        [place] Pager:  [phone]  Office:  [phone]    Denice Paradise 05/30/2019, 10:07 AM

## 2019-05-30 NOTE — Evaluation (Signed)
Occupational Therapy Evaluation Patient Details Name: [name] MRN: [id number] DOB: [birth year] Today's Date: 05/30/2019    History of Present Illness Pt is a 53 y.o. male with hx of LVAD, ESRD, DM, CAD, HLD who is s/p right TMA on 04/30/2019 by Dr. Donzetta Matters for non healing amp site.  He had previously undergone ray amp of right 4th toes on 03/21/2019 by Dr. Donzetta Matters. He was seen 05/21/2019 by Dr. Aundra Dubin and was having difficulty walking due to pain in his right foot and is unable to put any weight on this foot. 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   Clinical Impression   PTA pt at home, getting assist for bathing from son, limited mobility with RW due to pain BLE.Pt today is mod A for lateral scoot transfer - mainly to assist with painful RLE slightly more assist with bed pad as Pt is going "uphill" from recliner to bed. Pt requires max A for LB ADL at this time, and set up for UB grooming, mod A for UB bathing. At this time recommending continued skilled OT in the acute setting and afterwards SNF level care to maximize safety and independence in ADL and functional transfers. Next session to establish HEP with theraband as Pt is anticipating RBKA.    Follow Up Recommendations  SNF;Supervision/Assistance - 24 hour    Equipment Recommendations  Other (comment)(defer to next venue of care)    Recommendations for Other Services PT consult     Precautions / Restrictions Precautions Precautions: Fall;Other (comment) Precaution Comments: LVAD Required Braces or Orthoses: Other Brace Other Brace: Darco shoe--Right and left foot Restrictions Weight Bearing Restrictions: Yes RLE Weight Bearing: (heel weight bearing bilaterally) Other Position/Activity Restrictions:  pt to weight bear on bil  heels with Darco shoes      Mobility Bed Mobility Overal bed mobility: Needs Assistance Bed Mobility: Sit to Supine       Sit to supine: Min assist   General bed mobility comments:  assist to support RLE with transfer back to bed  Transfers Overall transfer level: Needs assistance   Transfers: Lateral/Scoot Transfers          Lateral/Scoot Transfers: Mod assist General transfer comment: Supported RLE in full knee extension for transfer.  Pt was able to slowly scoot to back to bed using BUE and pt performing lateral scoot.  BUE fatigued afterwards.     Balance Overall balance assessment: Needs assistance Sitting-balance support: No upper extremity supported;Feet supported Sitting balance-[name]: Poor Sitting balance - Comments: requires suport for RLE due to pain, results in posterior lean in attempted pain management                                   ADL either performed or assessed with clinical judgement   ADL Overall ADL's : Needs assistance/impaired Eating/Feeding: Set up;Sitting   Grooming: Set up;Sitting;Wash/dry hands;Wash/dry face   Upper Body Bathing: Minimal assistance;Sitting   Lower Body Bathing: Moderate assistance;Sitting/lateral leans   Upper Body Dressing : Minimal assistance;Sitting   Lower Body Dressing: Sitting/lateral leans;Maximal assistance Lower Body Dressing Details (indicate cue type and reason): Max A for donning darko shoe.  Toilet Transfer: Minimal assistance;BSC;Requires drop arm Toilet Transfer Details (indicate cue type and reason): lateral scoot transfer Toileting- Clothing Manipulation and Hygiene: Moderate assistance;Sitting/lateral lean       Functional mobility during ADLs: (NT this session - limited by pain)  Vision Baseline Vision/History: Wears glasses Patient Visual Report: No change from baseline       Perception     Praxis      Pertinent Vitals/Pain Faces Pain Scale: Hurts worst Pain Location: Right foot Pain Descriptors / Indicators: Grimacing;Guarding;Aching;Sharp     Hand Dominance Right   Extremity/Trunk Assessment Upper Extremity Assessment Upper Extremity  Assessment: Generalized weakness   Lower Extremity Assessment Lower Extremity Assessment: Defer to PT evaluation RLE Deficits / Details: recent transmet amputation and limited by pain LLE Deficits / Details: left great toe amputation, limited by pain   Cervical / Trunk Assessment Cervical / Trunk Assessment: Other exceptions Cervical / Trunk Exceptions: Reports lower back pain    Communication Communication Communication: No difficulties   Cognition Arousal/Alertness: Awake/alert Behavior During Therapy: Flat affect Overall Cognitive Status: Impaired/Different from baseline Area of Impairment: Following commands;Safety/judgement;Problem solving                       Following Commands: Follows one step commands consistently;Follows multi-step commands inconsistently Safety/Judgement: Decreased awareness of safety;Decreased awareness of deficits   Problem Solving: Slow processing;Decreased initiation     General Comments       Exercises     Shoulder Instructions      Home Living Family/patient expects to be discharged to:: Private residence Living Arrangements: Children Available Help at Discharge: Family;Available 24 hours/day Type of Home: House Home Access: Stairs to enter [employer] of Steps: 2 Entrance Stairs-Rails: None Home Layout: Multi-level;Able to live on main level with bedroom/bathroom Alternate Level Stairs-Number of Steps: flight Alternate Level Stairs-Rails: Left Bathroom Shower/Tub: Tub/shower unit   Bathroom Toilet: Standard Bathroom Accessibility: Yes   Home Equipment: Cane - single point;Walker - 4 wheels;Walker - 2 wheels;Wheelchair - manual(Hurrycane)   Additional Comments: pt lives with son and daughter, one of which is with him all the time. His bedroom is downstairs but the kids have moved him to the main level so he will only have to do 2 STE  Lives With: Son    Prior Functioning/Environment Level of Independence:  Needs assistance  Gait / Transfers Assistance Needed: Pt was ambulating short distances with RW with min guard assist after last admit ADL's / Homemaking Assistance Needed: ADLs with sons assist sponge bathes due to LVAD   Comments: uses RW        OT Problem List: Decreased strength;Decreased range of motion;Decreased activity tolerance;Impaired balance (sitting and/or standing);Decreased knowledge of use of DME or AE;Decreased knowledge of precautions;Pain      OT Treatment/Interventions: Self-care/ADL training;Therapeutic exercise;Energy conservation;DME and/or AE instruction;Therapeutic activities;Patient/family education    OT Goals(Current goals can be found in the care plan section) Acute Rehab OT Goals Patient Stated Goal: to go home  OT Goal Formulation: With patient Time For Goal Achievement: 06/13/19 Potential to Achieve Goals: Good ADL Goals Pt Will Perform Upper Body Dressing: with modified independence;sitting Pt Will Perform Lower Body Dressing: sitting/lateral leans;with mod assist Pt Will Transfer to Toilet: with min assist;with +2 assist;stand pivot transfer;bedside commode Pt Will Perform Toileting - Clothing Manipulation and hygiene: with supervision;sitting/lateral leans Pt/caregiver will Perform Home Exercise Program: Increased strength;Both right and left upper extremity;With theraband;Independently Additional ADL Goal #1: Pt will perform bed mobility at supervision level prior to engaging in ADL  OT Frequency: Min 2X/week   Barriers to D/C:            Co-evaluation  AM-PAC OT "6 Clicks" Daily Activity     Outcome Measure Help from another person eating meals?: None Help from another person taking care of personal grooming?: A Little Help from another person toileting, which includes using toliet, bedpan, or urinal?: A Lot Help from another person bathing (including washing, rinsing, drying)?: A Lot Help from another person to put on and  taking off regular upper body clothing?: A Little Help from another person to put on and taking off regular lower body clothing?: A Lot 6 Click Score: 16   End of Session Equipment Utilized During Treatment: Gait belt;Other (comment)(LVAD) Nurse Communication: Mobility status  Activity Tolerance: Patient tolerated treatment well Patient left: in bed;with call bell/phone within reach  OT Visit Diagnosis: Unsteadiness on feet (R26.81);Other abnormalities of gait and mobility (R26.89);Muscle weakness (generalized) (M62.81);Pain Pain - Right/Left: Right Pain - part of body: Ankle and joints of foot                Time: 1321-1343 OT Time Calculation (min): 22 min Charges:  OT General Charges $OT Visit: 1 Visit OT Evaluation $OT Eval Moderate Complexity: Luana OTR/L Acute Rehabilitation Services Pager: [phone] Office: [place] 05/30/2019, 4:46 PM

## 2019-05-31 ENCOUNTER — Ambulatory Visit: Payer: BC Managed Care – PPO | Admitting: Family

## 2019-05-31 LAB — GLUCOSE, CAPILLARY
Glucose-Capillary: 104 mg/dL — ABNORMAL HIGH (ref 70–99)
Glucose-Capillary: 134 mg/dL — ABNORMAL HIGH (ref 70–99)
Glucose-Capillary: 181 mg/dL — ABNORMAL HIGH (ref 70–99)
Glucose-Capillary: 173 mg/dL — ABNORMAL HIGH (ref 70–99)

## 2019-05-31 LAB — HEPARIN LEVEL (UNFRACTIONATED): Heparin Unfractionated: 0.31 IU/mL (ref 0.30–0.70)

## 2019-05-31 LAB — CBC
HCT: 28.1 % — ABNORMAL LOW (ref 39.0–52.0)
Hemoglobin: 8.4 g/dL — ABNORMAL LOW (ref 13.0–17.0)
MCH: 26.7 pg (ref 26.0–34.0)
MCHC: 29.9 g/dL — ABNORMAL LOW (ref 30.0–36.0)
MCV: 89.2 fL (ref 80.0–100.0)
Platelets: 293 10*3/uL (ref 150–400)
RBC: 3.15 MIL/uL — ABNORMAL LOW (ref 4.22–5.81)
RDW: 17.3 % — ABNORMAL HIGH (ref 11.5–15.5)
WBC: 20.8 10*3/uL — ABNORMAL HIGH (ref 4.0–10.5)
nRBC: 0.1 % (ref 0.0–0.2)

## 2019-05-31 LAB — BASIC METABOLIC PANEL
Anion gap: 16 — ABNORMAL HIGH (ref 5–15)
BUN: 55 mg/dL — ABNORMAL HIGH (ref 6–20)
CO2: 20 mmol/L — ABNORMAL LOW (ref 22–32)
Calcium: 8.8 mg/dL — ABNORMAL LOW (ref 8.9–10.3)
Chloride: 94 mmol/L — ABNORMAL LOW (ref 98–111)
Creatinine, Ser: 9.82 mg/dL — ABNORMAL HIGH (ref 0.61–1.24)
GFR calc Af Amer: 6 mL/min — ABNORMAL LOW (ref >60)
GFR calc non Af Amer: 5 mL/min — ABNORMAL LOW (ref >60)
Glucose, Bld: 101 mg/dL — ABNORMAL HIGH (ref 70–99)
Potassium: 4.2 mmol/L (ref 3.5–5.1)
Sodium: 130 mmol/L — ABNORMAL LOW (ref 135–145)

## 2019-05-31 LAB — PROTIME-INR
INR: 1.8 — ABNORMAL HIGH (ref 0.8–1.2)
Prothrombin Time: 20.8 seconds — ABNORMAL HIGH (ref 11.4–15.2)

## 2019-05-31 LAB — LACTATE DEHYDROGENASE: LDH: 159 U/L (ref 98–192)

## 2019-05-31 MED ORDER — DOXERCALCIFEROL 4 MCG/2ML IV SOLN
INTRAVENOUS | Status: AC
  Administered 2019-05-31: 2 ug via INTRAVENOUS
  Filled 2019-05-31: qty 2

## 2019-05-31 MED ORDER — VANCOMYCIN HCL IN DEXTROSE 1-5 GM/200ML-% IV SOLN
INTRAVENOUS | Status: AC
  Administered 2019-05-31: 1000 mg via INTRAVENOUS
  Filled 2019-05-31: qty 200

## 2019-05-31 MED ORDER — MIDODRINE HCL 5 MG PO TABS
ORAL_TABLET | ORAL | Status: AC
  Administered 2019-05-31: 5 mg via ORAL
  Filled 2019-05-31: qty 1

## 2019-05-31 MED ORDER — HEPARIN SODIUM (PORCINE) 1000 UNIT/ML IJ SOLN
INTRAMUSCULAR | Status: AC
  Filled 2019-05-31: qty 4

## 2019-05-31 MED ORDER — FENTANYL CITRATE (PF) 100 MCG/2ML IJ SOLN
25.0000 ug | Freq: Two times a day (BID) | INTRAMUSCULAR | Status: DC | PRN
  Administered 2019-05-31 – 2019-06-02 (×3): 25 ug via INTRAVENOUS
  Filled 2019-05-31 (×3): qty 2

## 2019-05-31 NOTE — Progress Notes (Signed)
1345 Pt received from HD; Pain score 7/10 in right lower extremity; pt given PRN fentanyl. Alert and oriented. Temp-acute change. RN administered Tylenol  650 mg for fever. RN will continue to monitor.     MEWS Guidelines - (patients age 54 and over)  Red - At High Risk for Deterioration Yellow - At risk for Deterioration  1. Go to room and assess patient 2. Validate data. Is this patient's baseline? If data confirmed: 3. Is this an acute change? 4. Administer prn meds/treatments as ordered. 5. Note Sepsis score 6. Review goals of care 7. Sports coach, RRT nurse and Provider. 8. Ask Provider to come to bedside.  9. Document patient condition/interventions/response. 10. Increase frequency of vital signs and focused assessments to at least q15 minutes x 4, then q30 minutes x2. - If stable, then q1h x3, then q4h x3 and then q8h or dept. routine. - If unstable, contact Provider & RRT nurse. Prepare for possible transfer. 11. Add entry in progress notes using the smart phrase ".MEWS". 1. Go to room and assess patient 2. Validate data. Is this patient's baseline? If data confirmed: 3. Is this an acute change? 4. Administer prn meds/treatments as ordered? 5. Note Sepsis score 6. Review goals of care 7. Sports coach and Provider 8. Call RRT nurse as needed. 9. Document patient condition/interventions/response. 10. Increase frequency of vital signs and focused assessments to at least q2h x2. - If stable, then q4h x2 and then q8h or dept. routine. - If unstable, contact Provider & RRT nurse. Prepare for possible transfer. 11. Add entry in progress notes using the smart phrase ".MEWS".  Green - Likely stable Lavender - Comfort Care Only  1. Continue routine/ordered monitoring.  2. Review goals of care. 1. Continue routine/ordered monitoring. 2. Review goals of care.

## 2019-05-31 NOTE — Progress Notes (Addendum)
Patient ID: [name], male   DOB: 1965-09-13, 53 y.o.   MRN: [id number]   Advanced Heart Failure VAD Team Note  PCP-Cardiologist: No primary care provider on file.   Subjective:    WBC trending up. Low grade temps. Had HD earlier today.   Complaining of pain in left foot. Denies SOB.    LVAD INTERROGATION:  HeartMate 3 LVAD:   Flow 4.3  liters/min, speed 5500, power 4, PI 5.3   Objective:    Vital Signs:   Temp:  [98.5 F (36.9 C)-100.7 F (38.2 C)] 99 F (37.2 C) (09/25 0400) Pulse Rate:  [70-110] 110 (09/25 1130) Resp:  [7-21] 21 (09/25 1130) BP: (86-142)/(53-85) 120/80 (09/25 1130) SpO2:  [96 %-99 %] 97 % (09/25 0400) Weight:  [84.8 kg-86.1 kg] 86.1 kg (09/25 0804) Last BM Date: 05/29/19 Mean arterial Pressure 90s   Intake/Output:   Intake/Output Summary (Last 24 hours) at 05/31/2019 1244 Last data filed at 05/31/2019 0731 Gross per 24 hour  Intake 511.18 ml  Output -  Net 511.18 ml     Physical Exam  Physical Exam: GENERAL: no acute distress. HEENT: normal  NECK: Supple, JVP 5-6   2+ bilaterally, no bruits.  No lymphadenopathy or thyromegaly appreciated.   CARDIAC:  Mechanical heart sounds with LVAD hum present.  LUNGS:  Clear to auscultation bilaterally.  ABDOMEN:  Soft, round, nontender, positive bowel sounds x4.     LVAD exit site:   Dressing dry and intact.  No erythema or drainage.  Stabilization device present and accurately applied.  EXTREMITIES:  Warm and dry, no cyanosis, clubbing, rash or edema . R and L foot dressings intact.  NEUROLOGIC:  Alert and oriented x 4.  Gait steady.  No aphasia.  No dysarthria.  Affect pleasant.       Telemetry   NSR 90s (personally reviewed).   Labs   Basic Metabolic Panel: Recent Labs  Lab 05/29/19 0426 05/29/19 1200 05/30/19 0426 05/31/19 0500  NA 130* 137 130* 130*  K 4.2 3.3* 3.7 4.2  CL 93* 98 93* 94*  CO2 22  --  22 20*  GLUCOSE 242* 167* 148* 101*  BUN 61* 20 37* 55*  CREATININE 11.01*  4.40* 7.39* 9.82*  CALCIUM 8.9  --  8.7* 8.8*    Liver Function Tests: No results for input(s): AST, ALT, ALKPHOS, BILITOT, PROT, ALBUMIN in the last 168 hours. No results for input(s): LIPASE, AMYLASE in the last 168 hours. No results for input(s): AMMONIA in the last 168 hours.  CBC: Recent Labs  Lab 05/28/19 1055 05/29/19 0426 05/29/19 1200 05/30/19 0426 05/31/19 0500  WBC 18.6* 19.0*  --  17.9* 20.8*  NEUTROABS 15.5*  --   --   --   --   HGB 10.2* 9.9* 13.3 8.6* 8.4*  HCT 32.0* 31.3* 39.0 28.9* 28.1*  MCV 87.9 88.2  --  90.6 89.2  PLT 330 307  --  284 293    INR: Recent Labs  Lab 05/28/19 1025 05/29/19 0426 05/30/19 0426 05/31/19 0500  INR 2.5* 2.3* 1.9* 1.8*    Other results:  EKG:    Imaging   No results found.   Medications:     Scheduled Medications: . aspirin EC  81 mg Oral Daily  . atorvastatin  40 mg Oral q1800  . Chlorhexidine Gluconate Cloth  6 each Topical Daily  . Chlorhexidine Gluconate Cloth  6 each Topical Q0600  . citalopram  20 mg Oral Daily  . doxercalciferol  2 mcg Intravenous Q M,W,F-HD  . dronabinol  2.5 mg Oral BID AC  . erythromycin  250 mg Oral TID  . gabapentin  300 mg Oral BID  . heparin      . influenza vac split quadrivalent PF  0.5 mL Intramuscular Tomorrow-1000  . insulin aspart  0-9 Units Subcutaneous TID WC  . insulin aspart  7 Units Subcutaneous Q supper  . insulin glargine  10 Units Subcutaneous Daily  . metoCLOPramide  10 mg Oral TID WC  . midodrine  5 mg Oral Q M,W,F  . multivitamin  1 tablet Oral QHS  . pantoprazole  40 mg Oral BID AC  . sevelamer carbonate  2,400 mg Oral TID WC    Infusions: . sodium chloride 250 mL (05/30/19 1023)  . sodium chloride 250 mL (05/30/19 1039)  . heparin 1,350 Units/hr (05/31/19 0731)  . piperacillin-tazobactam (ZOSYN)  IV 3.375 g (05/30/19 2231)  . vancomycin 1,000 mg (05/31/19 1125)    PRN Medications: sodium chloride, sodium chloride, acetaminophen, docusate sodium,  LORazepam, ondansetron (ZOFRAN) IV, oxyCODONE, promethazine, promethazine   Assessment/Plan:    1. Ischemic feet/ left great toe gangrene/PAD: Now s/p left great toe amputation by Dr Trula Slade.  Also still with considerable right foot pain and unable to bear weight.  Vascular planning on additional revision possibly this weekend.   2. Chronic systolic CHF: Ischemic cardiomyopathy. St Jude ICD. NSTEMI in March 2018 with DES to LAD and RCA, complicated by cardiogenic shock, low output requiring milrinone. Echo 8/18 with EF 20-25%, moderate MR. CPX (8/18) with severe functional impairment due to HF. He is s/p HM3 LVAD placement. He had pump thrombosis 6/19 due to [facility] outflow graft kinking. He had pump exchange for another HM3. Not a candidate for transplant with PAD. Volume status managed at HD.  - Continue ASA 81. Off coumadin for surgery.  - INR.1.8. On Heparin drip.   - Continue hydralazine 25 mg tid on non-HD days.  3. ESRD: HD on M-W-F with midodrine pre-HD. Had HD today.    4.CAD: NSTEMI in 3/18. LHC with 99% ulcerated lesion proximal RCA with left to right collaterals, 95% mid LAD stenosis after mid LAD stent. s/p PCI to RCA and LAD on 11/25/16.No chest pain.  - Continue statin and ASA. 5. h/o DVTs: Factor V Leiden heterozygote. -Anticoagulated. 6. Diabetic gastroparesis: He has seen gastroparesis specialist at [facility]. Surprisingly, gastric emptying study was normal. However, electrogastrogram showed poor gastric accomodation/capacity. Therefore, small frequent meals recommended. - Continue reglan 10 mg tid/ac and Marinol.  - Continue Protonix bid.  - He will continue erythromycinindefinitely.  - Limit narcotic use as much as possible. 7. KYH:CWCBJS today.  8. Pain: Considerable right foot pain as above and unable to bear weight.   9. ID: WBC trending up, suspect related to ischemic right foot, not healing well post-TMA.  Febrile.    - Blood CX -pending.   -  Continue  vanc/Zosyn for now.    Length of Stay: 3  Darrick Grinder, NP 05/31/2019, 12:44 PM  VAD Team --- VAD ISSUES ONLY--- Pager [phone] (7am - 7am)  Advanced Heart Failure Team  Pager [phone] (M-F; 7a - 4p)  Please contact [facility] for night-coverage after hours (4p -7a ) and weekends on [email or web address]  Patient seen with NP, agree with the above note.   He was febrile last night.  He is now on vancomycin/Zosyn now, cultures still pending.  WBCs trending up.  I suspect the source is the  poorly healing right TMA.   - Continue vancomycin/Zosyn.  - He will need right BKA, per Dr. Trula Slade will be over weekend if he worsens and otherwise Monday.   Continue to hold warfarin and use heparin gtt.   HD today, next on Monday.   Kmari Halter 05/31/2019 4:41 PM

## 2019-05-31 NOTE — TOC Initial Note (Addendum)
Transition of Care [facility]) - Initial/Assessment Note    Patient Details  Name: [name] MRN: [id number] Date of Birth: [birth year]  Transition of Care [facility]) CM/SW Contact:    Alberteen Sam, [place] Phone Number: [phone] 05/31/2019, 1:26 PM  Clinical Narrative:                  CSW continues to search for SNF placement for LVAD patient that can also transport to dialysis. [facility] reviewing chart currently, [facility] and [facility] have declined. CSW following up with [facility] and [facility].  Update : [facility] reports they are not taking any more dialysis patients at this time.    CSW has lvm with [facility], as [facility] reports they may be able to accommodate patient needs. Pending response.   [facility] reports they are unable to accept LVAD patients even with teaching being offered.   Expected Discharge Plan: [place] Barriers to Discharge: No SNF bed, Continued Medical Work up(still in search of LVAD and Dialysis patient SNF bed)   Patient Goals and CMS Choice   CMS Medicare.gov Compare Post Acute Care list provided to:: Patient Choice offered to / list presented to : Patient  Expected Discharge Plan and Services Expected Discharge Plan: [place] Acute Care Choice: [place] arrangements for the past 2 months: Single Family Home                                      Prior Living Arrangements/Services Living arrangements for the past 2 months: Single Family Home Lives with:: Self Patient language and need for interpreter reviewed:: Yes Do you feel safe going back to the place where you live?: No   needs short term rehab  Need for Family Participation in Patient Care: No (Comment) Care giver support system in place?: Yes (comment)   Criminal Activity/Legal Involvement Pertinent to Current Situation/Hospitalization: No - Comment as  needed  Activities of Daily Living Home Assistive Devices/Equipment: Walker (specify type) ADL Screening (condition at time of admission) Patient's cognitive ability adequate to safely complete daily activities?: Yes Is the patient deaf or have difficulty hearing?: No Does the patient have difficulty seeing, even when wearing glasses/contacts?: No Does the patient have difficulty concentrating, remembering, or making decisions?: No Patient able to express need for assistance with ADLs?: Yes Does the patient have difficulty dressing or bathing?: No Independently performs ADLs?: Yes (appropriate for developmental age) Does the patient have difficulty walking or climbing stairs?: Yes Weakness of Legs: Left Weakness of Arms/Hands: None  Permission Sought/Granted Permission sought to share information with : Case Manager, Customer service manager, Family Supports Permission granted to share information with : Yes, Verbal Permission Granted     Permission granted to share info w AGENCY: SNFs        Emotional Assessment Appearance:: Appears stated age Attitude/Demeanor/Rapport: Gracious Affect (typically observed): Calm Orientation: : Oriented to Place, Oriented to  Time, Oriented to Situation, Oriented to Self Alcohol / Substance Use: Not Applicable Psych Involvement: No (comment)  Admission diagnosis:  GANGRENE LEFT GREAT TOE Patient Active Problem List   Diagnosis Date Noted  . Hospital discharge follow-up 04/15/2019  . Back pain 04/15/2019  . Warfarin anticoagulation 04/15/2019  . Ischemic pain of right foot 03/15/2019  . Ischemic foot 03/15/2019  . Subtherapeutic international normalized ratio (INR)  08/28/2018  . Acute vomiting   . Heart failure ([place]) 05/23/2018  . Hypertensive urgency 03/25/2018  . Benign essential HTN   . Crohn's disease with complication ([place])   . Diabetes mellitus type 2 in nonobese (HCC)   . Anemia of chronic disease   . Sinus tachycardia    . Left ventricular assist device (LVAD) complication 03/54/6568  . Left ventricular assist device (LVAD) complication, initial encounter 02/28/2018  . Factor V Leiden carrier ([place]) 01/10/2018  . History of creation of ostomy ([place]) 01/10/2018  . ESRD (end stage renal disease) ([place]) 12/11/2017  . Left ventricular assist device present ([place]) 12/05/2017  . ESRD (end stage renal disease) on dialysis ([place]) 10/29/2017  . Intractable nausea and vomiting 07/31/2017  . 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) ([place]) 07/10/2017  . LVAD (left ventricular assist device) present ([place])   . Palliative care by specialist   . Chronic systolic CHF (congestive heart failure) ([place]) 06/07/2017  . ACP (advance care planning)   . Encounter for central line placement   . Palliative care encounter   . CHF exacerbation ([place]) 04/05/2017  . Diabetic keto-acidosis ([place] ) 04/05/2017  . ESRD on dialysis ([place]) 01/17/2017  . AKI (acute kidney injury) ([place])   . Non-ST elevation (NSTEMI) myocardial infarction ([place])   . CHF (congestive heart failure) ([place]) 09/02/2016  . Elevated serum creatinine   . Encounter for therapeutic drug monitoring 08/03/2016  . Chest pain 04/26/2012  . SVT (supraventricular tachycardia) ([place]) 04/26/2012  . Gastroparesis 11/18/2011  . Coronary artery disease   . Ischemic cardiomyopathy   . Essential hypertension   . Automatic implantable cardioverter-defibrillator in situ   . Deep venous thrombosis ([place])   . POLYP, COLON 09/25/2008  . Dyslipidemia, goal LDL below 70 09/25/2008  . GASTRITIS 09/25/2008  . DIVERTICULOSIS, COLON 09/25/2008  . History of Crohn's disease 09/05/2006   PCP:  Richarda Osmond, DO Pharmacy[facility]   [id number]: [phone]: [phone]    Social Determinants of Health (SDOH) Interventions    Readmission Risk Interventions Readmission Risk  Prevention Plan 03/20/2019  Transportation Screening Complete  Medication Review ([place] Complete  [place]RI or [place]  SW Recovery Care/Counseling Consult Complete  Palliative Care Screening Not [place]alo Lake[place] Applicable  Some recent data might be hidden

## 2019-05-31 NOTE — Progress Notes (Signed)
Low grade fevers and elevated wbc with continued pain at right TMA site.  IT is becoming more evident that his right TMA is not going to heal.  He will likely need this converted to a BKA or AKA.  The patient appropriately does not want to keep going back to the OR for additional procedures.  Ideally, we wold like to give him a few days to see if he is going to have issues healing his left toe amputation, since the capillary bleeding was not great at the time of the toe amputation.  That way, both legs could be addressed at the same time.  We will continue to evaluate him over the weekend.  [name]

## 2019-05-31 NOTE — Progress Notes (Signed)
ANTICOAGULATION CONSULT NOTE - Follow Up Consult  Pharmacy Consult for warfarin to heparin Indication: LVAD HM3  Allergies  Allergen Reactions  . Metformin And Related Diarrhea    Patient Measurements: Height: 5' 8"  (172.7 cm) Weight: 189 lb 13.1 oz (86.1 kg) IBW/kg (Calculated) : 68.4   Vital Signs: Temp: 102.6 F (39.2 C) (09/25 1410) Temp Source: Oral (09/25 1410) BP: 120/80 (09/25 1130) Pulse Rate: 110 (09/25 1130)  Labs: Recent Labs    05/29/19 0426 05/29/19 1200 05/30/19 0426 05/30/19 1800 05/31/19 0500  HGB 9.9* 13.3 8.6*  --  8.4*  HCT 31.3* 39.0 28.9*  --  28.1*  PLT 307  --  284  --  293  LABPROT 25.1*  --  21.7*  --  20.8*  INR 2.3*  --  1.9*  --  1.8*  HEPARINUNFRC  --   --   --  0.25* 0.31  CREATININE 11.01* 4.40* 7.39*  --  9.82*    Estimated Creatinine Clearance: 9.2 mL/min (A) (by C-G formula based on SCr of 9.82 mg/dL (H)).   Medical History: Past Medical History:  Diagnosis Date  . Angina   . ASCVD (arteriosclerotic cardiovascular disease)    , Anterior infarction 2005, LAD diagonal bifurcation intervention 03/2004  . Automatic implantable cardiac defibrillator -St. Jude's       . Benign neoplasm of colon   . CHF (congestive heart failure) ([place])   . Chronic systolic heart failure ([place])   . Coronary artery disease     Widely patent previously placed stents in the left anterior   . Crohn's disease ([place])   . Deep venous thrombosis (HCC)    Recurrent-on Coumadin  . Dialysis patient ([place])   . Dyspnea   . Gastroparesis   . GERD (gastroesophageal reflux disease)   . High cholesterol   . Hyperlipidemia   . Hypersomnolent    Previous diagnosis of narcolepsy  . Hypertension, essential   . Ischemic cardiomyopathy    Ejection fraction 15-20% catheterization 2010  . Type II or unspecified type diabetes mellitus without mention of complication, not stated as uncontrolled   . Unspecified gastritis and gastroduodenitis without mention of  hemorrhage      Assessment: 20 yoM on warfarin PTA for LVAD and hx FV Leiden admitted for ongoing foot ischemia. Pt now s/p toe amputation -  warfarin held for procedures, INR down to 1.8 LDH stable 150s May need further intervention by VVS so will continue heparin and hold warfarin for now. Heparin drip 1350 uts/hr HL 0.3 at goal   PTA warfarin 7.37m Tu/Fr 581mQOD  Goal of Therapy:  INR 2-3  Heparin Level 0.3-0.7 Monitor platelets by anticoagulation protocol: Yes   Plan:  Continue heparin 1350 units/hr Recheck heparin level in am Monitor s/s bleeding  LiBonnita Nasutiharm.D. CPP, BCPS Clinical Pharmacist [phone]/25/2020 2:29 PM

## 2019-05-31 NOTE — Progress Notes (Signed)
[facility] NEPHROLOGY PROGRESS NOTE  Assessment/ Plan: Pt is a 53 y.o. yo male ESRD on HD, CAD, severe ICM status post LVAD, DM, A. fib admitted for amputation of left great toe.  Dialysis Orders: [facility] MWF 4h37mn 400/800 EDW 84kg 2K/3Ca  No heparin bolus  Venofer 1023mIV x 5 until 9/28 Mircera 200 mcg IV q 2 weeks (last 9/16)  Hectorol 71m98mIV TIW  # L great toe gangrene: Status post left great toe amputation including metatarsal head on 9/23.  # ESRD -HD MWF.  HD today, tolerating well.  UF 3 kg as tolerated.  # Hypertension/volume - BP controlled. No volume excess on exam.   # Anemia -drop in hemoglobin likely due to blood loss during surgery . On ESA as outpatient, last Mircera on 9/16.   # Metabolic bone disease -Continue VDRA/binders.   # CAD/ICM s/p LVAD - per HF team   Subjective: Seen and examined at HD unit. Tolerating HD well. No CP, SOB, N/V.   Objective Vital signs in last 24 hours: Vitals:   05/31/19 0804 05/31/19 0812 05/31/19 0830 05/31/19 0900  BP: 105/68 95/76 (!) 86/67 (!) 95/53  Pulse: 91 70 92 91  Resp: 11 (!) 9 (!) 7 (!) 9  Temp:      TempSrc: Oral     SpO2:      Weight: 86.1 kg     Height:       Weight change: 0 kg  Intake/Output Summary (Last 24 hours) at 05/31/2019 0938 Last data filed at 05/30/2019 1626 Gross per 24 hour  Intake 408.39 ml  Output -  Net 408.39 ml       Labs: Basic Metabolic Panel: Recent Labs  Lab 05/29/19 0426 05/29/19 1200 05/30/19 0426 05/31/19 0500  NA 130* 137 130* 130*  K 4.2 3.3* 3.7 4.2  CL 93* 98 93* 94*  CO2 22  --  22 20*  GLUCOSE 242* 167* 148* 101*  BUN 61* 20 37* 55*  CREATININE 11.01* 4.40* 7.39* 9.82*  CALCIUM 8.9  --  8.7* 8.8*   Liver Function Tests: No results for input(s): AST, ALT, ALKPHOS, BILITOT, PROT, ALBUMIN in the last 168 hours. No results for input(s): LIPASE, AMYLASE in the last 168 hours. No results for input(s): AMMONIA in the last 168  hours. CBC: Recent Labs  Lab 05/28/19 1055 05/29/19 0426 05/29/19 1200 05/30/19 0426 05/31/19 0500  WBC 18.6* 19.0*  --  17.9* 20.8*  NEUTROABS 15.5*  --   --   --   --   HGB 10.2* 9.9* 13.3 8.6* 8.4*  HCT 32.0* 31.3* 39.0 28.9* 28.1*  MCV 87.9 88.2  --  90.6 89.2  PLT 330 307  --  284 293   Cardiac Enzymes: No results for input(s): CKTOTAL, CKMB, CKMBINDEX, TROPONINI in the last 168 hours. CBG: Recent Labs  Lab 05/30/19 0622 05/30/19 1127 05/30/19 1625 05/30/19 2128 05/31/19 0619  GLUCAP 100* 229* 178* 118* 104*    Iron Studies: No results for input(s): IRON, TIBC, TRANSFERRIN, FERRITIN in the last 72 hours. Studies/Results: No results found.  Medications: Infusions: . sodium chloride 250 mL (05/30/19 1023)  . sodium chloride 250 mL (05/30/19 1039)  . heparin 1,350 Units/hr (05/31/19 0731)  . piperacillin-tazobactam (ZOSYN)  IV 3.375 g (05/30/19 2231)  . vancomycin      Scheduled Medications: . aspirin EC  81 mg Oral Daily  . atorvastatin  40 mg Oral q1800  . Chlorhexidine Gluconate Cloth  6 each Topical  Daily  . Chlorhexidine Gluconate Cloth  6 each Topical Q0600  . citalopram  20 mg Oral Daily  . doxercalciferol  2 mcg Intravenous Q M,W,F-HD  . dronabinol  2.5 mg Oral BID AC  . erythromycin  250 mg Oral TID  . gabapentin  300 mg Oral BID  . influenza vac split quadrivalent PF  0.5 mL Intramuscular Tomorrow-1000  . insulin aspart  0-9 Units Subcutaneous TID WC  . insulin aspart  7 Units Subcutaneous Q supper  . insulin glargine  10 Units Subcutaneous Daily  . metoCLOPramide  10 mg Oral TID WC  . midodrine  5 mg Oral Q M,W,F  . multivitamin  1 tablet Oral QHS  . pantoprazole  40 mg Oral BID AC  . sevelamer carbonate  2,400 mg Oral TID WC    have reviewed scheduled and prn medications.  Physical Exam: General:NAD, comfortable Heart: Mechanical sound of LVAD device Lungs:clear b/l, no crackle Abdomen:soft, Non-tender, non-distended Extremities:No  edema, dressing applied to the amputation site Dialysis Access: Left IJ TDC  [name] Prasad Oryn Casanova 05/31/2019,9:38 AM  LOS: 3 days  Pager: [phone]

## 2019-06-01 DIAGNOSIS — I998 Other disorder of circulatory system: Secondary | ICD-10-CM

## 2019-06-01 DIAGNOSIS — Z95811 Presence of heart assist device: Secondary | ICD-10-CM

## 2019-06-01 LAB — PROTIME-INR
INR: 1.7 — ABNORMAL HIGH (ref 0.8–1.2)
Prothrombin Time: 20.1 seconds — ABNORMAL HIGH (ref 11.4–15.2)

## 2019-06-01 LAB — CBC
HCT: 28.1 % — ABNORMAL LOW (ref 39.0–52.0)
Hemoglobin: 8.7 g/dL — ABNORMAL LOW (ref 13.0–17.0)
MCH: 27.1 pg (ref 26.0–34.0)
MCHC: 31 g/dL (ref 30.0–36.0)
MCV: 87.5 fL (ref 80.0–100.0)
Platelets: 283 10*3/uL (ref 150–400)
RBC: 3.21 MIL/uL — ABNORMAL LOW (ref 4.22–5.81)
RDW: 17.2 % — ABNORMAL HIGH (ref 11.5–15.5)
WBC: 23.1 10*3/uL — ABNORMAL HIGH (ref 4.0–10.5)
nRBC: 0 % (ref 0.0–0.2)

## 2019-06-01 LAB — GLUCOSE, CAPILLARY
Glucose-Capillary: 117 mg/dL — ABNORMAL HIGH (ref 70–99)
Glucose-Capillary: 238 mg/dL — ABNORMAL HIGH (ref 70–99)
Glucose-Capillary: 169 mg/dL — ABNORMAL HIGH (ref 70–99)
Glucose-Capillary: 102 mg/dL — ABNORMAL HIGH (ref 70–99)

## 2019-06-01 LAB — BASIC METABOLIC PANEL
Anion gap: 14 (ref 5–15)
BUN: 28 mg/dL — ABNORMAL HIGH (ref 6–20)
CO2: 25 mmol/L (ref 22–32)
Calcium: 8.8 mg/dL — ABNORMAL LOW (ref 8.9–10.3)
Chloride: 95 mmol/L — ABNORMAL LOW (ref 98–111)
Creatinine, Ser: 6.7 mg/dL — ABNORMAL HIGH (ref 0.61–1.24)
GFR calc Af Amer: 10 mL/min — ABNORMAL LOW (ref >60)
GFR calc non Af Amer: 9 mL/min — ABNORMAL LOW (ref >60)
Glucose, Bld: 131 mg/dL — ABNORMAL HIGH (ref 70–99)
Potassium: 3.7 mmol/L (ref 3.5–5.1)
Sodium: 134 mmol/L — ABNORMAL LOW (ref 135–145)

## 2019-06-01 LAB — HEPARIN LEVEL (UNFRACTIONATED): Heparin Unfractionated: 0.35 IU/mL (ref 0.30–0.70)

## 2019-06-01 LAB — LACTATE DEHYDROGENASE: LDH: 174 U/L (ref 98–192)

## 2019-06-01 MED ORDER — SODIUM CHLORIDE 0.9 % IV SOLN
500.0000 mg | INTRAVENOUS | Status: DC
  Administered 2019-06-01 – 2019-06-06 (×5): 500 mg via INTRAVENOUS
  Filled 2019-06-01 (×7): qty 0.5

## 2019-06-01 NOTE — Progress Notes (Signed)
Patient ID: [name], male   DOB: 12/25/1965, 53 y.o.   MRN: [id number] Discussed with Dr. Haroldine Laws.  Very high risk patient.  Has increased white count and temp to 102.8.  Concerned that nonhealing right transmetatarsal amputation could be the source of this.  I again discussed this at length with the patient.  He understands the need for higher level of amputation.  I did discuss below-knee versus above-knee amputation.  From a anatomic standpoint, he could potentially heal a below-knee amputation.  In light of his extremely poor cardiac reserve and also end-stage renal disease I feel that he has at least a 50% chance of failure of right below-knee amputation.  I discussed this with the patient and explained that there would be some benefit to his rehabilitation potential if he could heal a below-knee amputation but this would not make a great difference due to his other comorbidities.  He feels comfortable and wishes to proceed with an above-knee amputation which I feel is the appropriate decision.  We will plan surgery in the morning at 0 730

## 2019-06-01 NOTE — Progress Notes (Signed)
Patient ID: [name], male   DOB: 12-17-1965, 53 y.o.   MRN: [id number]  Progress Note    06/01/2019 7:13 AM 3 Days Post-Op  Subjective: Comfortable overall.  Reports some discomfort in his right foot.  Minimal in the left   Vitals:   05/31/19 2349 06/01/19 0418  BP: (!) 83/68 105/84  Pulse: 85 78  Resp: 12 15  Temp: 99.3 F (37.4 C) 98.4 F (36.9 C)  SpO2: 95% 98%   Physical Exam: Right transmetatarsal amputation is not healing.  Large eschar over the entire dorsum of his foot with some wound separation.  Left great toe amputation without drainage.  Does have some duskiness at the proximal edge.  CBC    Component Value Date/Time   WBC 23.1 (H) 06/01/2019 0555   RBC 3.21 (L) 06/01/2019 0555   HGB 8.7 (L) 06/01/2019 0555   HGB 11.7 (L) 09/07/2016 1457   HCT 28.1 (L) 06/01/2019 0555   HCT 37.5 09/07/2016 1457   PLT 283 06/01/2019 0555   PLT 279 09/07/2016 1457   MCV 87.5 06/01/2019 0555   MCV 81 09/07/2016 1457   MCH 27.1 06/01/2019 0555   MCHC 31.0 06/01/2019 0555   RDW 17.2 (H) 06/01/2019 0555   RDW 14.9 09/07/2016 1457   LYMPHSABS 1.0 05/28/2019 1055   LYMPHSABS 1.3 09/07/2016 1457   MONOABS 1.5 (H) 05/28/2019 1055   EOSABS 0.4 05/28/2019 1055   EOSABS 0.2 09/07/2016 1457   BASOSABS 0.1 05/28/2019 1055   BASOSABS 0.0 09/07/2016 1457    BMET    Component Value Date/Time   NA 134 (L) 06/01/2019 0555   NA 144 05/11/2017 0938   K 3.7 06/01/2019 0555   CL 95 (L) 06/01/2019 0555   CO2 25 06/01/2019 0555   GLUCOSE 131 (H) 06/01/2019 0555   BUN 28 (H) 06/01/2019 0555   BUN 68 (H) 05/11/2017 0938   CREATININE 6.70 (H) 06/01/2019 0555   CREATININE 1.63 (H) 11/09/2016 1505   CALCIUM 8.8 (L) 06/01/2019 0555   CALCIUM 8.7 06/27/2017 1152   GFRNONAA 9 (L) 06/01/2019 0555   GFRNONAA 48 (L) 11/09/2016 1505   GFRAA 10 (L) 06/01/2019 0555   GFRAA 55 (L) 11/09/2016 1505    INR    Component Value Date/Time   INR 1.7 (H) 06/01/2019 0555      Intake/Output Summary (Last 24 hours) at 06/01/2019 0713 Last data filed at 05/31/2019 2100 Gross per 24 hour  Intake 240 ml  Output -  Net 240 ml     Assessment/Plan:  53 y.o. male nonhealing right transmetatarsal amputation.  As discussed by Dr. Trula Slade, will need either of the right BKA or AKA.  Will need coordination regarding anticoagulation due to his LVAD.  No evidence of sepsis.  Continue to watch left great toe amputation     Rosetta Posner, MD [facility] [phone] 06/01/2019 7:13 AM

## 2019-06-01 NOTE — Progress Notes (Signed)
ANTICOAGULATION CONSULT NOTE - Follow Up Consult  Pharmacy Consult for warfarin to heparin Indication: LVAD HM3  Allergies  Allergen Reactions  . Metformin And Related Diarrhea    Patient Measurements: Height: 5' 8"  (172.7 cm) Weight: 184 lb 8.4 oz (83.7 kg) IBW/kg (Calculated) : 68.4   Vital Signs: Temp: 98.4 F (36.9 C) (09/26 0418) Temp Source: Oral (09/26 0418) BP: 105/84 (09/26 0418) Pulse Rate: 78 (09/26 0418)  Labs: Recent Labs    05/30/19 0426 05/30/19 1800 05/31/19 0500 06/01/19 0555  HGB 8.6*  --  8.4* 8.7*  HCT 28.9*  --  28.1* 28.1*  PLT 284  --  293 283  LABPROT 21.7*  --  20.8* 20.1*  INR 1.9*  --  1.8* 1.7*  HEPARINUNFRC  --  0.25* 0.31 0.35  CREATININE 7.39*  --  9.82* 6.70*    Estimated Creatinine Clearance: 13.3 mL/min (A) (by C-G formula based on SCr of 6.7 mg/dL (H)).   Medical History: Past Medical History:  Diagnosis Date  . Angina   . ASCVD (arteriosclerotic cardiovascular disease)    , Anterior infarction 2005, LAD diagonal bifurcation intervention 03/2004  . Automatic implantable cardiac defibrillator -St. Jude's       . Benign neoplasm of colon   . CHF (congestive heart failure) ([place])   . Chronic systolic heart failure ([place])   . Coronary artery disease     Widely patent previously placed stents in the left anterior   . Crohn's disease ([place])   . Deep venous thrombosis (HCC)    Recurrent-on Coumadin  . Dialysis patient ([place])   . Dyspnea   . Gastroparesis   . GERD (gastroesophageal reflux disease)   . High cholesterol   . Hyperlipidemia   . Hypersomnolent    Previous diagnosis of narcolepsy  . Hypertension, essential   . Ischemic cardiomyopathy    Ejection fraction 15-20% catheterization 2010  . Type II or unspecified type diabetes mellitus without mention of complication, not stated as uncontrolled   . Unspecified gastritis and gastroduodenitis without mention of hemorrhage      Assessment: 8 yoM on warfarin PTA for  LVAD and hx FV Leiden admitted for ongoing foot ischemia. Pt now s/p toe amputation -  warfarin held for procedures, INR down to 1.7 LDH 174. May need further intervention by VVS so will continue heparin and hold warfarin for now. Heparin drip 1350 uts/hr HL 0.35 at goal   PTA warfarin 7.68m Tu/Fr 544mQOD  Goal of Therapy:  INR 2-3  Heparin Level 0.3-0.7 Monitor platelets by anticoagulation protocol: Yes   Plan:  Continue heparin 1350 units/hr Check daily heparin level, CBC Monitor s/s bleeding  MeVertis KelchPharmD PGY2 Cardiology Pharmacy Resident Phone ([phone]/26/2020       7:31 AM  Please check [email or web address] for unit-specific pharmacist phone numbers

## 2019-06-01 NOTE — Plan of Care (Signed)
  Problem: Health Behavior/Discharge Planning: Goal: Ability to manage health-related needs will improve Outcome: Progressing   Problem: Clinical Measurements: Goal: Ability to maintain clinical measurements within normal limits will improve Outcome: Progressing Goal: Respiratory complications will improve Outcome: Progressing   Problem: Coping: Goal: Level of anxiety will decrease Outcome: Progressing   Problem: Pain Managment: Goal: General experience of comfort will improve Outcome: Progressing

## 2019-06-01 NOTE — Progress Notes (Addendum)
[facility] NEPHROLOGY PROGRESS NOTE  Assessment/ Plan: Pt is a 53 y.o. yo male ESRD on HD, CAD, severe ICM status post LVAD, DM, A. fib admitted for amputation of left great toe.   Physical Exam: General:NAD, comfortable Heart: Mechanical sound of LVAD device Lungs:clear b/l, no crackle Abdomen:soft, Non-tender, non-distended Extremities:No edema, dressing applied to the amputation site Dialysis Access: Left IJ TDC  Dialysis: [facility] MWF 4h1mn 400/800 EDW 84kg 2K/3Ca  No heparin bolus  Venofer 10470mIV x 5 until 9/28 Mircera 200 mcg IV q 2 weeks (last 9/16)  Hectorol 70m50mIV TIW   Assessment/ Plan # L great toe gangrene: Status post left great toe amputation including metatarsal head on 9/23.  Also R TMA wound is not healing well, see VVS notes.   # ESRD -HD MWF.  HD yesterday. Next HD Monday.   # Hypertension/volume - BP controlled. No volume excess on exam. At dry wt   # Anemia -drop in hemoglobin likely due to blood loss during surgery . On ESA as outpatient, last Mircera on 9/16.   # Metabolic bone disease -Continue VDRA/binders.   # CAD/ICM s/p LVAD - per HF team   RobKelly SplinterD 06/01/2019, 1:23 PM      Subjective: Seen and examined in pt's room, no new c/o's.   Objective Vital signs in last 24 hours: Vitals:   06/01/19 0800 06/01/19 1000 06/01/19 1153 06/01/19 1300  BP:   98/80   Pulse:  83  91  Resp: 12 13  17   Temp: 98.6 F (37 C)  98.9 F (37.2 C)   TempSrc: Oral  Oral   SpO2: 98% 98% 95% 97%  Weight:      Height:       Weight change: 1.3 kg  Intake/Output Summary (Last 24 hours) at 06/01/2019 1318 Last data filed at 06/01/2019 1202 Gross per 24 hour  Intake 2558.21 ml  Output -  Net 2558.21 ml       Labs: Basic Metabolic Panel: Recent Labs  Lab 05/30/19 0426 05/31/19 0500 06/01/19 0555  NA 130* 130* 134*  K 3.7 4.2 3.7  CL 93* 94* 95*  CO2 22 20* 25  GLUCOSE 148* 101* 131*  BUN 37* 55* 28*   CREATININE 7.39* 9.82* 6.70*  CALCIUM 8.7* 8.8* 8.8*   Liver Function Tests: No results for input(s): AST, ALT, ALKPHOS, BILITOT, PROT, ALBUMIN in the last 168 hours. No results for input(s): LIPASE, AMYLASE in the last 168 hours. No results for input(s): AMMONIA in the last 168 hours. CBC: Recent Labs  Lab 05/28/19 1055 05/29/19 0426  05/30/19 0426 05/31/19 0500 06/01/19 0555  WBC 18.6* 19.0*  --  17.9* 20.8* 23.1*  NEUTROABS 15.5*  --   --   --   --   --   HGB 10.2* 9.9*   < > 8.6* 8.4* 8.7*  HCT 32.0* 31.3*   < > 28.9* 28.1* 28.1*  MCV 87.9 88.2  --  90.6 89.2 87.5  PLT 330 307  --  284 293 283   < > = values in this interval not displayed.   Cardiac Enzymes: No results for input(s): CKTOTAL, CKMB, CKMBINDEX, TROPONINI in the last 168 hours. CBG: Recent Labs  Lab 05/31/19 1339 05/31/19 1608 05/31/19 2127 06/01/19 0614 06/01/19 1139  GLUCAP 134* 181* 173* 117* 238*    Iron Studies: No results for input(s): IRON, TIBC, TRANSFERRIN, FERRITIN in the last 72 hours. Studies/Results: No results found.  Medications: Infusions: . sodium  chloride Stopped (05/31/19 1330)  . sodium chloride 10 mL/hr at 06/01/19 1200  . heparin 1,350 Units/hr (06/01/19 1200)  . meropenem (MERREM) IV Stopped (06/01/19 1045)  . vancomycin Stopped (05/31/19 1758)    Scheduled Medications: . aspirin EC  81 mg Oral Daily  . atorvastatin  40 mg Oral q1800  . Chlorhexidine Gluconate Cloth  6 each Topical Daily  . Chlorhexidine Gluconate Cloth  6 each Topical Q0600  . citalopram  20 mg Oral Daily  . doxercalciferol  2 mcg Intravenous Q M,W,F-HD  . dronabinol  2.5 mg Oral BID AC  . erythromycin  250 mg Oral TID  . gabapentin  300 mg Oral BID  . influenza vac split quadrivalent PF  0.5 mL Intramuscular Tomorrow-1000  . insulin aspart  0-9 Units Subcutaneous TID WC  . insulin aspart  7 Units Subcutaneous Q supper  . insulin glargine  10 Units Subcutaneous Daily  . metoCLOPramide  10 mg  Oral TID WC  . midodrine  5 mg Oral Q M,W,F  . multivitamin  1 tablet Oral QHS  . pantoprazole  40 mg Oral BID AC  . sevelamer carbonate  2,400 mg Oral TID WC    have reviewed scheduled and prn medications.

## 2019-06-01 NOTE — Progress Notes (Signed)
Pharmacy Antibiotic Note  [name] is a 53 y.o. male admitted on 05/28/2019 with foot ischemia now s/p amputation. Pharmacy has been consulted for vancomycin and meropenem dosing with concern over infection at surgical sites. Pt is ESRD on HD MWF (last HD 9/23). Patient had been receiving Zosyn since 9/24, but with increase in WBC and fever, MD would like to escalate to meropenem.   Plan: Vancomycin 1079m IV qHD Meropenem 500 mg IV q24h Monitor cultures, fever, vancomycin levels as needed  Height: 5' 8"  (172.7 cm) Weight: 184 lb 8.4 oz (83.7 kg) IBW/kg (Calculated) : 68.4  Temp (24hrs), Avg:100.7 F (38.2 C), Min:98.4 F (36.9 C), Max:102.8 F (39.3 C)  Recent Labs  Lab 05/28/19 1055 05/29/19 0426 05/29/19 1200 05/30/19 0426 05/31/19 0500 06/01/19 0555  WBC 18.6* 19.0*  --  17.9* 20.8* 23.1*  CREATININE  --  11.01* 4.40* 7.39* 9.82* 6.70*    Estimated Creatinine Clearance: 13.3 mL/min (A) (by C-G formula based on SCr of 6.7 mg/dL (H)).    Allergies  Allergen Reactions  . Metformin And Related Diarrhea    Antimicrobials this admission: Vancomycin 9/24 >> Zosyn 9/24 >> 9/26 Meropenem 9/26 >>  Dose adjustments this admission: none  Microbiology results: 9/24 BCx - ngtd 9/22 MRSA - neg  Thank you for allowing pharmacy to be a part of this patient's care.  MVertis Kelch PharmD PGY2 Cardiology Pharmacy Resident Phone ([phone]/26/2020       3:19 PM  Please check [email or web address] for unit-specific pharmacist phone numbers

## 2019-06-01 NOTE — Progress Notes (Signed)
Patient ID: [name], male   DOB: September 11, 1965, 53 y.o.   MRN: [id number]   Advanced Heart Failure VAD Team Note  PCP-Cardiologist: No primary care provider on file.   Subjective:    Still with some pain in left foot. R foot TMA not healing well. Tmax 102.8 on vanc/zosyn. WBC up to 23.1  VVS has seen today and pending R BKA vs AKA. +/- close f/u on L toe amp   Denies SOB, orthopnea or PND   LVAD INTERROGATION:  HeartMate 3 LVAD:   Flow 4.6  liters/min, speed 5500, power 4, PI 3.0  Objective:    Vital Signs:   Temp:  [98.4 F (36.9 C)-102.8 F (39.3 C)] 98.6 F (37 C) (09/26 0800) Pulse Rate:  [78-110] 78 (09/26 0418) Resp:  [12-21] 12 (09/26 0800) BP: (83-142)/(58-84) 105/84 (09/26 0418) SpO2:  [94 %-98 %] 98 % (09/26 0800) Weight:  [83.7 kg] 83.7 kg (09/26 0500) Last BM Date: 05/29/19 Mean arterial Pressure 90-95   Intake/Output:   Intake/Output Summary (Last 24 hours) at 06/01/2019 0908 Last data filed at 06/01/2019 0800 Gross per 24 hour  Intake 580 ml  Output -  Net 580 ml     Physical Exam  Physical Exam: General:  Chronically ill appearing. Lethargic  NAD.  HEENT: normal  Neck: supple. RIJ TLC  JVP 9 Carotids 2+ bilat; no bruits. No lymphadenopathy or thryomegaly appreciated. Cor: LVAD hum.  Left chest perm cath  Lungs: Clear. Abdomen:  soft, nontender, non-distended. No hepatosplenomegaly. No bruits or masses. Good bowel sounds. Driveline site clean. Anchor in place.  Extremities: no cyanosis, clubbing, rash. Warm no edema  R & left foot dressings  Neuro: alert & oriented x 3. No focal deficits. Moves all 4 without problem   Telemetry   NSR 70-80s (personally reviewed).   Labs   Basic Metabolic Panel: Recent Labs  Lab 05/29/19 0426 05/29/19 1200 05/30/19 0426 05/31/19 0500 06/01/19 0555  NA 130* 137 130* 130* 134*  K 4.2 3.3* 3.7 4.2 3.7  CL 93* 98 93* 94* 95*  CO2 22  --  22 20* 25  GLUCOSE 242* 167* 148* 101* 131*  BUN 61* 20 37*  55* 28*  CREATININE 11.01* 4.40* 7.39* 9.82* 6.70*  CALCIUM 8.9  --  8.7* 8.8* 8.8*    Liver Function Tests: No results for input(s): AST, ALT, ALKPHOS, BILITOT, PROT, ALBUMIN in the last 168 hours. No results for input(s): LIPASE, AMYLASE in the last 168 hours. No results for input(s): AMMONIA in the last 168 hours.  CBC: Recent Labs  Lab 05/28/19 1055 05/29/19 0426 05/29/19 1200 05/30/19 0426 05/31/19 0500 06/01/19 0555  WBC 18.6* 19.0*  --  17.9* 20.8* 23.1*  NEUTROABS 15.5*  --   --   --   --   --   HGB 10.2* 9.9* 13.3 8.6* 8.4* 8.7*  HCT 32.0* 31.3* 39.0 28.9* 28.1* 28.1*  MCV 87.9 88.2  --  90.6 89.2 87.5  PLT 330 307  --  284 293 283    INR: Recent Labs  Lab 05/28/19 1025 05/29/19 0426 05/30/19 0426 05/31/19 0500 06/01/19 0555  INR 2.5* 2.3* 1.9* 1.8* 1.7*    Other results:  EKG:    Imaging   No results found.   Medications:     Scheduled Medications: . aspirin EC  81 mg Oral Daily  . atorvastatin  40 mg Oral q1800  . Chlorhexidine Gluconate Cloth  6 each Topical Daily  . Chlorhexidine Gluconate  Cloth  6 each Topical [id number]  . citalopram  20 mg Oral Daily  . doxercalciferol  2 mcg Intravenous Q M,W,F-HD  . dronabinol  2.5 mg Oral BID AC  . erythromycin  250 mg Oral TID  . gabapentin  300 mg Oral BID  . influenza vac split quadrivalent PF  0.5 mL Intramuscular Tomorrow-1000  . insulin aspart  0-9 Units Subcutaneous TID WC  . insulin aspart  7 Units Subcutaneous Q supper  . insulin glargine  10 Units Subcutaneous Daily  . metoCLOPramide  10 mg Oral TID WC  . midodrine  5 mg Oral Q M,W,F  . multivitamin  1 tablet Oral QHS  . pantoprazole  40 mg Oral BID AC  . sevelamer carbonate  2,400 mg Oral TID WC    Infusions: . sodium chloride 250 mL (05/30/19 1023)  . sodium chloride 250 mL (05/30/19 1039)  . heparin 1,350 Units/hr (06/01/19 0220)  . piperacillin-tazobactam (ZOSYN)  IV 3.375 g (05/31/19 2134)  . vancomycin 1,000 mg (05/31/19 1125)     PRN Medications: sodium chloride, sodium chloride, acetaminophen, docusate sodium, fentaNYL (SUBLIMAZE) injection, LORazepam, ondansetron (ZOFRAN) IV, oxyCODONE, promethazine, promethazine   Assessment/Plan:    1. Ischemic feet/ left great toe gangrene/PAD: Now s/p left great toe amputation by Dr Trula Slade.  Also still with considerable right foot pain and unable to bear weight.  - has been seen by Dr. Donnetta Hutching. Feels he will need R BKA vs AKA and possible further amputation on L - Remains febrile and WBC climbing on vanc/zosyn - BCX remain negative - Broaden to vanc/meropenem. Will d/w Dr. Donnetta Hutching regarding timing of further amputations  2. Chronic systolic CHF: Ischemic cardiomyopathy. St Jude ICD. NSTEMI in March 2018 with DES to LAD and RCA, complicated by cardiogenic shock, low output requiring milrinone. Echo 8/18 with EF 20-25%, moderate MR. CPX (8/18) with severe functional impairment due to HF. He is s/p HM3 LVAD placement. He had pump thrombosis 6/19 due to [facility] outflow graft kinking. He had pump exchange for another HM3. Not a candidate for transplant with PAD. Volume status managed at HD.  - Continue ASA 81. Off coumadin for surgery.  - INR.1.7. On Heparin drip.  No bleeding - Continue hydralazine 25 mg tid on non-HD days.  3. ESRD: HD on M-W-F with midodrine pre-HD. Had HD today.    4.CAD: NSTEMI in 3/18. LHC with 99% ulcerated lesion proximal RCA with left to right collaterals, 95% mid LAD stenosis after mid LAD stent. s/p PCI to RCA and LAD on 11/25/16.No s/s of ischemia - Continue statin and ASA. 5. h/o DVTs: Factor V Leiden heterozygote. -Anticoagulated. 6. Diabetic gastroparesis: He has seen gastroparesis specialist at [facility]. Surprisingly, gastric emptying study was normal. However, electrogastrogram showed poor gastric accomodation/capacity. Therefore, small frequent meals recommended. - Continue reglan 10 mg tid/ac and Marinol.  - Continue Protonix bid.  -  He will continue erythromycinindefinitely.  - Limit narcotic use as much as possible. 7. NLZ:JQBHAL today. MAPs in low 90s 8. Pain: Considerable right foot pain as above and unable to bear weight.  He is on pain meds 9. ID: WBC trending up, suspect related to ischemic right foot, not healing well post-TMA.  Febrile.    - as above. Expand abx coverage. Further amputation per VVS   Length of Stay: Emlyn, MD 06/01/2019, 9:08 AM  VAD Team --- VAD ISSUES ONLY--- Pager [phone] (7am - 7am)  Advanced Heart Failure Team  Pager [phone] (M-F; 7a -  4p)  Please contact [facility] for night-coverage after hours (4p -7a ) and weekends on [email or web address]

## 2019-06-01 NOTE — Plan of Care (Signed)
Pt now on new antibiotics for his amputations. Sterile technique was used for cleaning wounds and dressing changes. Wound consult placed for dressing orders.

## 2019-06-01 NOTE — H&P (View-Only) (Signed)
Patient ID: [name], male   DOB: [birth year], 53 y.o.   MRN: [id number] Discussed with Dr. Haroldine Laws.  Very high risk patient.  Has increased white count and temp to 102.8.  Concerned that nonhealing right transmetatarsal amputation could be the source of this.  I again discussed this at length with the patient.  He understands the need for higher level of amputation.  I did discuss below-knee versus above-knee amputation.  From a anatomic standpoint, he could potentially heal a below-knee amputation.  In light of his extremely poor cardiac reserve and also end-stage renal disease I feel that he has at least a 50% chance of failure of right below-knee amputation.  I discussed this with the patient and explained that there would be some benefit to his rehabilitation potential if he could heal a below-knee amputation but this would not make a great difference due to his other comorbidities.  He feels comfortable and wishes to proceed with an above-knee amputation which I feel is the appropriate decision.  We will plan surgery in the morning at 0 730

## 2019-06-02 ENCOUNTER — Inpatient Hospital Stay (HOSPITAL_COMMUNITY): Payer: BC Managed Care – PPO | Admitting: Certified Registered Nurse Anesthetist

## 2019-06-02 ENCOUNTER — Encounter (HOSPITAL_COMMUNITY)
Admission: RE | Disposition: A | Discharge status: Skilled Nursing Facility | Payer: Self-pay | Source: Home / Self Care | Attending: Cardiology

## 2019-06-02 ENCOUNTER — Encounter (HOSPITAL_COMMUNITY): Payer: Self-pay | Admitting: Certified Registered Nurse Anesthetist

## 2019-06-02 DIAGNOSIS — I96 Gangrene, not elsewhere classified: Secondary | ICD-10-CM

## 2019-06-02 HISTORY — PX: AMPUTATION: SHX166

## 2019-06-02 LAB — RENAL FUNCTION PANEL
Albumin: 2.2 g/dL — ABNORMAL LOW (ref 3.5–5.0)
Anion gap: 18 — ABNORMAL HIGH (ref 5–15)
BUN: 50 mg/dL — ABNORMAL HIGH (ref 6–20)
CO2: 20 mmol/L — ABNORMAL LOW (ref 22–32)
Calcium: 8.8 mg/dL — ABNORMAL LOW (ref 8.9–10.3)
Chloride: 95 mmol/L — ABNORMAL LOW (ref 98–111)
Creatinine, Ser: 10.59 mg/dL — ABNORMAL HIGH (ref 0.61–1.24)
GFR calc Af Amer: 6 mL/min — ABNORMAL LOW (ref >60)
GFR calc non Af Amer: 5 mL/min — ABNORMAL LOW (ref >60)
Glucose, Bld: 245 mg/dL — ABNORMAL HIGH (ref 70–99)
Phosphorus: 8.5 mg/dL — ABNORMAL HIGH (ref 2.5–4.6)
Potassium: 4.7 mmol/L (ref 3.5–5.1)
Sodium: 133 mmol/L — ABNORMAL LOW (ref 135–145)

## 2019-06-02 LAB — CBC
HCT: 25.5 % — ABNORMAL LOW (ref 39.0–52.0)
HCT: 25.4 % — ABNORMAL LOW (ref 39.0–52.0)
Hemoglobin: 8 g/dL — ABNORMAL LOW (ref 13.0–17.0)
Hemoglobin: 7.7 g/dL — ABNORMAL LOW (ref 13.0–17.0)
MCH: 27.1 pg (ref 26.0–34.0)
MCH: 26.5 pg (ref 26.0–34.0)
MCHC: 31.4 g/dL (ref 30.0–36.0)
MCHC: 30.3 g/dL (ref 30.0–36.0)
MCV: 86.4 fL (ref 80.0–100.0)
MCV: 87.3 fL (ref 80.0–100.0)
Platelets: 294 10*3/uL (ref 150–400)
Platelets: 347 10*3/uL (ref 150–400)
RBC: 2.95 MIL/uL — ABNORMAL LOW (ref 4.22–5.81)
RBC: 2.91 MIL/uL — ABNORMAL LOW (ref 4.22–5.81)
RDW: 17.4 % — ABNORMAL HIGH (ref 11.5–15.5)
RDW: 17.6 % — ABNORMAL HIGH (ref 11.5–15.5)
WBC: 23.2 10*3/uL — ABNORMAL HIGH (ref 4.0–10.5)
WBC: 27.6 10*3/uL — ABNORMAL HIGH (ref 4.0–10.5)
nRBC: 0.1 % (ref 0.0–0.2)
nRBC: 0 % (ref 0.0–0.2)

## 2019-06-02 LAB — GLUCOSE, CAPILLARY
Glucose-Capillary: 198 mg/dL — ABNORMAL HIGH (ref 70–99)
Glucose-Capillary: 151 mg/dL — ABNORMAL HIGH (ref 70–99)
Glucose-Capillary: 231 mg/dL — ABNORMAL HIGH (ref 70–99)
Glucose-Capillary: 219 mg/dL — ABNORMAL HIGH (ref 70–99)

## 2019-06-02 LAB — BASIC METABOLIC PANEL
Anion gap: 14 (ref 5–15)
BUN: 42 mg/dL — ABNORMAL HIGH (ref 6–20)
CO2: 24 mmol/L (ref 22–32)
Calcium: 8.7 mg/dL — ABNORMAL LOW (ref 8.9–10.3)
Chloride: 96 mmol/L — ABNORMAL LOW (ref 98–111)
Creatinine, Ser: 9.72 mg/dL — ABNORMAL HIGH (ref 0.61–1.24)
GFR calc Af Amer: 6 mL/min — ABNORMAL LOW (ref >60)
GFR calc non Af Amer: 5 mL/min — ABNORMAL LOW (ref >60)
Glucose, Bld: 192 mg/dL — ABNORMAL HIGH (ref 70–99)
Potassium: 3.7 mmol/L (ref 3.5–5.1)
Sodium: 134 mmol/L — ABNORMAL LOW (ref 135–145)

## 2019-06-02 LAB — PROTIME-INR
INR: 1.7 — ABNORMAL HIGH (ref 0.8–1.2)
Prothrombin Time: 20 seconds — ABNORMAL HIGH (ref 11.4–15.2)

## 2019-06-02 LAB — HEPARIN LEVEL (UNFRACTIONATED): Heparin Unfractionated: 0.32 IU/mL (ref 0.30–0.70)

## 2019-06-02 LAB — LACTATE DEHYDROGENASE: LDH: 155 U/L (ref 98–192)

## 2019-06-02 SURGERY — AMPUTATION BELOW KNEE
Anesthesia: General | Laterality: Right

## 2019-06-02 MED ORDER — HEPARIN (PORCINE) 25000 UT/250ML-% IV SOLN
1350.0000 [IU]/h | INTRAVENOUS | Status: DC
  Administered 2019-06-02 – 2019-06-05 (×4): 1350 [IU]/h via INTRAVENOUS
  Filled 2019-06-02 (×4): qty 250

## 2019-06-02 MED ORDER — 0.9 % SODIUM CHLORIDE (POUR BTL) OPTIME
TOPICAL | Status: DC | PRN
  Administered 2019-06-02: 1000 mL

## 2019-06-02 MED ORDER — DIPHENHYDRAMINE HCL 12.5 MG/5ML PO ELIX
12.5000 mg | ORAL_SOLUTION | Freq: Four times a day (QID) | ORAL | Status: DC | PRN
  Filled 2019-06-02: qty 5

## 2019-06-02 MED ORDER — CHLORHEXIDINE GLUCONATE CLOTH 2 % EX PADS
6.0000 | MEDICATED_PAD | Freq: Every day | CUTANEOUS | Status: DC
  Administered 2019-06-03 – 2019-06-04 (×2): 6 via TOPICAL

## 2019-06-02 MED ORDER — SODIUM CHLORIDE 0.9 % IV SOLN: 100.0000 mL | INTRAVENOUS | Status: DC | PRN

## 2019-06-02 MED ORDER — PROPOFOL 10 MG/ML IV BOLUS
INTRAVENOUS | Status: AC
  Filled 2019-06-02: qty 40

## 2019-06-02 MED ORDER — DEXAMETHASONE SODIUM PHOSPHATE 10 MG/ML IJ SOLN
INTRAMUSCULAR | Status: DC | PRN
  Administered 2019-06-02: 4 mg via INTRAVENOUS

## 2019-06-02 MED ORDER — ONDANSETRON HCL 4 MG/2ML IJ SOLN
INTRAMUSCULAR | Status: DC | PRN
  Administered 2019-06-02: 4 mg via INTRAVENOUS

## 2019-06-02 MED ORDER — DIPHENHYDRAMINE HCL 50 MG/ML IJ SOLN: 12.5000 mg | Freq: Four times a day (QID) | INTRAMUSCULAR | Status: DC | PRN

## 2019-06-02 MED ORDER — NALOXONE HCL 0.4 MG/ML IJ SOLN: 0.4000 mg | INTRAMUSCULAR | Status: DC | PRN

## 2019-06-02 MED ORDER — ONDANSETRON HCL 4 MG/2ML IJ SOLN: 4.0000 mg | Freq: Four times a day (QID) | INTRAMUSCULAR | Status: DC | PRN

## 2019-06-02 MED ORDER — MORPHINE SULFATE 2 MG/ML IV SOLN
INTRAVENOUS | Status: DC
  Filled 2019-06-02: qty 30

## 2019-06-02 MED ORDER — LIDOCAINE-PRILOCAINE 2.5-2.5 % EX CREA
1.0000 "application " | TOPICAL_CREAM | CUTANEOUS | Status: DC | PRN
  Filled 2019-06-02: qty 5

## 2019-06-02 MED ORDER — ALBUMIN HUMAN 5 % IV SOLN
INTRAVENOUS | Status: DC | PRN
  Administered 2019-06-02: 08:00:00 via INTRAVENOUS

## 2019-06-02 MED ORDER — OXYCODONE HCL 5 MG PO TABS: 5.0000 mg | ORAL_TABLET | Freq: Once | ORAL | Status: DC | PRN

## 2019-06-02 MED ORDER — HYDRALAZINE HCL 20 MG/ML IJ SOLN
10.0000 mg | INTRAMUSCULAR | Status: DC | PRN
  Administered 2019-06-02 – 2019-06-04 (×4): 10 mg via INTRAVENOUS
  Filled 2019-06-02 (×2): qty 1

## 2019-06-02 MED ORDER — SODIUM CHLORIDE 0.9% FLUSH: 9.0000 mL | INTRAVENOUS | Status: DC | PRN

## 2019-06-02 MED ORDER — HYDRALAZINE HCL 20 MG/ML IJ SOLN
INTRAMUSCULAR | Status: AC
  Administered 2019-06-02: 21:00:00 10 mg
  Filled 2019-06-02: qty 1

## 2019-06-02 MED ORDER — FENTANYL CITRATE (PF) 100 MCG/2ML IJ SOLN: 25.0000 ug | INTRAMUSCULAR | Status: DC | PRN

## 2019-06-02 MED ORDER — FENTANYL CITRATE (PF) 250 MCG/5ML IJ SOLN
INTRAMUSCULAR | Status: AC
  Filled 2019-06-02: qty 5

## 2019-06-02 MED ORDER — PENTAFLUOROPROP-TETRAFLUOROETH EX AERO
1.0000 "application " | INHALATION_SPRAY | CUTANEOUS | Status: DC | PRN
  Filled 2019-06-02: qty 116

## 2019-06-02 MED ORDER — SODIUM CHLORIDE 0.9 % IV SOLN
INTRAVENOUS | Status: DC
  Administered 2019-06-02: 07:00:00 via INTRAVENOUS

## 2019-06-02 MED ORDER — LIDOCAINE HCL (PF) 1 % IJ SOLN: 5.0000 mL | INTRAMUSCULAR | Status: DC | PRN

## 2019-06-02 MED ORDER — HEPARIN SODIUM (PORCINE) 1000 UNIT/ML DIALYSIS: 1000.0000 [IU] | INTRAMUSCULAR | Status: DC | PRN

## 2019-06-02 MED ORDER — MIDAZOLAM HCL 2 MG/2ML IJ SOLN
INTRAMUSCULAR | Status: DC | PRN
  Administered 2019-06-02: 1 mg via INTRAVENOUS

## 2019-06-02 MED ORDER — OXYCODONE HCL 5 MG/5ML PO SOLN: 5.0000 mg | Freq: Once | ORAL | Status: DC | PRN

## 2019-06-02 MED ORDER — ONDANSETRON HCL 4 MG/2ML IJ SOLN
4.0000 mg | Freq: Four times a day (QID) | INTRAMUSCULAR | Status: DC | PRN
  Administered 2019-06-03 – 2019-06-04 (×2): 4 mg via INTRAVENOUS
  Filled 2019-06-02: qty 2

## 2019-06-02 MED ORDER — ALTEPLASE 2 MG IJ SOLR: 2.0000 mg | Freq: Once | INTRAMUSCULAR | Status: DC | PRN

## 2019-06-02 MED ORDER — DARBEPOETIN ALFA 200 MCG/0.4ML IJ SOSY
200.0000 ug | PREFILLED_SYRINGE | INTRAMUSCULAR | Status: DC
  Administered 2019-06-05 – 2019-06-12 (×2): 200 ug via INTRAVENOUS
  Filled 2019-06-02 (×2): qty 0.4

## 2019-06-02 MED ORDER — ETOMIDATE 2 MG/ML IV SOLN
INTRAVENOUS | Status: DC | PRN
  Administered 2019-06-02: 20 mg via INTRAVENOUS

## 2019-06-02 MED ORDER — FENTANYL 40 MCG/ML IV SOLN
INTRAVENOUS | Status: DC
  Administered 2019-06-02: 130.6 ug via INTRAVENOUS
  Administered 2019-06-02: 10.4 ug via INTRAVENOUS
  Administered 2019-06-03: 0 ug via INTRAVENOUS
  Administered 2019-06-03: 20 ug via INTRAVENOUS
  Administered 2019-06-03: 10 ug via INTRAVENOUS
  Administered 2019-06-03 (×3): 40 ug via INTRAVENOUS
  Administered 2019-06-04: 20 ug via INTRAVENOUS
  Administered 2019-06-04: 0 ug via INTRAVENOUS
  Administered 2019-06-04: 30 ug via INTRAVENOUS
  Administered 2019-06-04: 210 ug via INTRAVENOUS
  Administered 2019-06-04: 0 ug via INTRAVENOUS
  Administered 2019-06-05: 1000 ug via INTRAVENOUS
  Administered 2019-06-05: 180 ug via INTRAVENOUS
  Administered 2019-06-05: 20 ug via INTRAVENOUS
  Administered 2019-06-05: 90 ug via INTRAVENOUS
  Administered 2019-06-05: 190 ug via INTRAVENOUS
  Administered 2019-06-05: 140 ug via INTRAVENOUS
  Administered 2019-06-06: 120 ug via INTRAVENOUS
  Administered 2019-06-06: 150 ug via INTRAVENOUS
  Administered 2019-06-06: 30 ug via INTRAVENOUS
  Filled 2019-06-02 (×2): qty 25

## 2019-06-02 MED ORDER — MIDAZOLAM HCL 2 MG/2ML IJ SOLN
INTRAMUSCULAR | Status: AC
  Filled 2019-06-02: qty 2

## 2019-06-02 MED ORDER — FENTANYL CITRATE (PF) 250 MCG/5ML IJ SOLN
INTRAMUSCULAR | Status: DC | PRN
  Administered 2019-06-02: 50 ug via INTRAVENOUS
  Administered 2019-06-02: 25 ug via INTRAVENOUS
  Administered 2019-06-02: 50 ug via INTRAVENOUS

## 2019-06-02 SURGICAL SUPPLY — 37 items
BLADE SAW GIGLI 510 (BLADE) ×2 IMPLANT
BNDG ELASTIC 4X5.8 VLCR STR LF (GAUZE/BANDAGES/DRESSINGS) ×1 IMPLANT
BNDG ELASTIC 6X5.8 VLCR STR LF (GAUZE/BANDAGES/DRESSINGS) ×1 IMPLANT
BNDG GAUZE ELAST 4 BULKY (GAUZE/BANDAGES/DRESSINGS) ×1 IMPLANT
CANISTER SUCT 3000ML PPV (MISCELLANEOUS) ×2 IMPLANT
CLIP LIGATING EXTRA MED SLVR (CLIP) ×2 IMPLANT
CLIP LIGATING EXTRA SM BLUE (MISCELLANEOUS) ×2 IMPLANT
COVER SURGICAL LIGHT HANDLE (MISCELLANEOUS) ×3 IMPLANT
COVER WAND RF STERILE (DRAPES) ×2 IMPLANT
DRAIN SNY 10X20 3/4 PERF (WOUND CARE) IMPLANT
DRAPE HALF SHEET 40X57 (DRAPES) ×2 IMPLANT
DRAPE ORTHO SPLIT 77X108 STRL (DRAPES) ×4
DRAPE SURG ORHT 6 SPLT 77X108 (DRAPES) ×2 IMPLANT
ELECT REM PT RETURN 9FT ADLT (ELECTROSURGICAL) ×2
ELECTRODE REM PT RTRN 9FT ADLT (ELECTROSURGICAL) ×1 IMPLANT
EVACUATOR SILICONE 100CC (DRAIN) IMPLANT
GAUZE SPONGE 4X4 12PLY STRL (GAUZE/BANDAGES/DRESSINGS) ×3 IMPLANT
GAUZE XEROFORM 5X9 LF (GAUZE/BANDAGES/DRESSINGS) ×2 IMPLANT
GLOVE SS BIOGEL STRL SZ 7.5 (GLOVE) ×1 IMPLANT
GLOVE SUPERSENSE BIOGEL SZ 7.5 (GLOVE) ×1
GOWN STRL REUS W/ TWL LRG LVL3 (GOWN DISPOSABLE) ×3 IMPLANT
GOWN STRL REUS W/TWL LRG LVL3 (GOWN DISPOSABLE) ×4
KIT BASIN OR (CUSTOM PROCEDURE TRAY) ×2 IMPLANT
KIT TURNOVER KIT B (KITS) ×2 IMPLANT
NS IRRIG 1000ML POUR BTL (IV SOLUTION) ×2 IMPLANT
PACK GENERAL/GYN (CUSTOM PROCEDURE TRAY) ×2 IMPLANT
PAD ARMBOARD 7.5X6 YLW CONV (MISCELLANEOUS) ×4 IMPLANT
PADDING CAST COTTON 6X4 STRL (CAST SUPPLIES) IMPLANT
STAPLER VISISTAT 35W (STAPLE) ×2 IMPLANT
STOCKINETTE IMPERVIOUS LG (DRAPES) ×2 IMPLANT
SUT ETHILON 3 0 PS 1 (SUTURE) ×1 IMPLANT
SUT VIC AB 0 CT1 18XCR BRD 8 (SUTURE) ×2 IMPLANT
SUT VIC AB 0 CT1 8-18 (SUTURE) ×6
SUT VICRYL AB 2 0 TIES (SUTURE) ×2 IMPLANT
TOWEL GREEN STERILE (TOWEL DISPOSABLE) ×3 IMPLANT
UNDERPAD 30X30 (UNDERPADS AND DIAPERS) ×2 IMPLANT
WATER STERILE IRR 1000ML POUR (IV SOLUTION) ×2 IMPLANT

## 2019-06-02 NOTE — Anesthesia Postprocedure Evaluation (Signed)
Anesthesia Post Note  Patient: [name]  Procedure(s) Performed: ABOVE KNEE AMPUTATION (Right )     Patient location during evaluation: PACU Anesthesia Type: General Level of consciousness: awake and alert Pain management: pain level controlled Vital Signs Assessment: post-procedure vital signs reviewed and stable Respiratory status: spontaneous breathing, nonlabored ventilation, respiratory function stable and patient connected to nasal cannula oxygen Cardiovascular status: blood pressure returned to baseline and stable Postop Assessment: no apparent nausea or vomiting Anesthetic complications: no    Last Vitals:  Vitals:   06/02/19 0948 06/02/19 1038  BP: 112/89   Pulse:    Resp:    Temp:    SpO2: 90% 90%    Last Pain:  Vitals:   06/02/19 1038  TempSrc:   PainSc: Asleep                 Xzayvier Fagin S

## 2019-06-02 NOTE — Progress Notes (Addendum)
LVAD Coordinator Rounding Note:  Admitted 05/28/19 per Dr. Aundra Dubin for heparin bridge for left toe amputation.    HM III LVAD implanted on 06/12/17 by Dr. Darcey Nora under Destination Therapy criteria. Pump exchange performed on 03/01/18 per Dr. Darcey Nora for pump thrombus.   Pt lying in bed awake, anticipating going to OR for AKA on right leg. He says plan changed from below to above knee amputation due to physical exam by Dr. Donnetta Hutching.   Vital signs: Temp: 99.0 HR: 86 Doppler Pressure: 100 Automatic BP: 108/91 (95) O2 Sat: 98% RA  Wt: 184>186.9 lbs   LVAD interrogation reveals:  Speed: 5500 Flow: 4.2 Power: 4.5w PI: 3.4 Alarms: none Events:  none Hematocrit: 28  Fixed speed: 5500 Low speed limit: 5200   Drive Line: Dressing C/D/I; anchor intact and accurately applied. Weekly dressing changes per bedside nurse; next dressing change due 06/03/19.  Labs:  LDH trend: 200>186>156>159>174>155  INR trend: 2.5>2.3>1.9>1.8>1.7>1.7  Anticoagulation Plan: -INR Goal: 2.0 - 3.0 -ASA Dose: 81 mg daily   Device: St Jude dual ICD - Therapies: on 200 bpm - Last check: 01/01/18 - 2 hrs Afl/Afib; EMI 04/02/18   Infection:  - BC x 2 on 05/30/19>>pending  Plan/Recommendations:  1. Call VAD pager if any questions about VAD equipment or drive line. 2. Right AKA scheduled today; VAD Coordinator will accompany patient to surgery.  Zada Girt RN, [place] Coordinator [phone]

## 2019-06-02 NOTE — Plan of Care (Signed)
°  Problem: Clinical Measurements: °Goal: Respiratory complications will improve °Outcome: Progressing °Goal: Cardiovascular complication will be avoided °Outcome: Progressing °  °Problem: Activity: °Goal: Risk for activity intolerance will decrease °Outcome: Progressing °  °

## 2019-06-02 NOTE — Anesthesia Procedure Notes (Signed)
Procedure Name: LMA Insertion Date/Time: 06/02/2019 8:15 AM Performed by: Larene Beach, CRNA Pre-anesthesia Checklist: Patient identified, Emergency Drugs available, Suction available and Patient being monitored Patient Re-evaluated:Patient Re-evaluated prior to induction Oxygen Delivery Method: Circle system utilized Preoxygenation: Pre-oxygenation with 100% oxygen Induction Type: IV induction Ventilation: Oral airway inserted - appropriate to patient size and Mask ventilation without difficulty LMA: LMA inserted LMA Size: 5.0 Number of attempts: 1 Airway Equipment and Method: Oral airway Placement Confirmation: positive ETCO2,  breath sounds checked- equal and bilateral and CO2 detector Tube secured with: Tape Dental Injury: Teeth and Oropharynx as per pre-operative assessment

## 2019-06-02 NOTE — Progress Notes (Signed)
ANTICOAGULATION CONSULT NOTE - Follow Up Consult  Pharmacy Consult for warfarin to heparin Indication: LVAD HM3  Allergies  Allergen Reactions  . Metformin And Related Diarrhea    Patient Measurements: Height: 5' 7.99" (172.7 cm) Weight: 187 lb 13.3 oz (85.2 kg) IBW/kg (Calculated) : 68.38   Vital Signs: Temp: 98.6 F (37 C) (09/27 0930) Temp Source: Oral (09/27 0300) BP: 112/89 (09/27 0948) Pulse Rate: 92 (09/27 0930)  Labs: Recent Labs    05/31/19 0500 06/01/19 0555 06/02/19 0340  HGB 8.4* 8.7* 8.0*  HCT 28.1* 28.1* 25.5*  PLT 293 283 294  LABPROT 20.8* 20.1* 20.0*  INR 1.8* 1.7* 1.7*  HEPARINUNFRC 0.31 0.35 0.32  CREATININE 9.82* 6.70* 9.72*    Estimated Creatinine Clearance: 9.2 mL/min (A) (by C-G formula based on SCr of 9.72 mg/dL (H)).   Medical History: Past Medical History:  Diagnosis Date  . Angina   . ASCVD (arteriosclerotic cardiovascular disease)    , Anterior infarction 2005, LAD diagonal bifurcation intervention 03/2004  . Automatic implantable cardiac defibrillator -St. Jude's       . Benign neoplasm of colon   . CHF (congestive heart failure) ([place])   . Chronic systolic heart failure ([place])   . Coronary artery disease     Widely patent previously placed stents in the left anterior   . Crohn's disease ([place])   . Deep venous thrombosis (HCC)    Recurrent-on Coumadin  . Dialysis patient ([place])   . Dyspnea   . Gastroparesis   . GERD (gastroesophageal reflux disease)   . High cholesterol   . Hyperlipidemia   . Hypersomnolent    Previous diagnosis of narcolepsy  . Hypertension, essential   . Ischemic cardiomyopathy    Ejection fraction 15-20% catheterization 2010  . Type II or unspecified type diabetes mellitus without mention of complication, not stated as uncontrolled   . Unspecified gastritis and gastroduodenitis without mention of hemorrhage     Assessment: 77 yoM on warfarin PTA for LVAD and hx FV Leiden admitted for ongoing foot  ischemia. 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 May need further intervention (L AKA) by VVS so will continue heparin and hold warfarin for now. Heparin ok to resume 12 hours postop today ~2100  Heparin drip 1350 uts/hr - heparin level = 0.32 this AM prior to OR  PTA warfarin 7.42m Tu/Fr 574mQOD  Goal of Therapy:  INR 2-3  Heparin Level 0.3-0.7 Monitor platelets by anticoagulation protocol: Yes   Plan:  Restart heparin IV 1350 units/hr at 2100 Check daily heparin level, CBC Monitor s/s bleeding  MeVertis KelchPharmD PGY2 Cardiology Pharmacy Resident Phone ([phone]/27/2020       12:25 PM  Please check [email or web address] for unit-specific pharmacist phone numbers

## 2019-06-02 NOTE — Progress Notes (Addendum)
VAD Coordinator Procedure Note:   VAD Coordinator met patient in his room. Pt undergoing right AKA per Dr. Donnetta Hutching. Accompanied patient to holding room 37. Pt awake, denies pain, very quiet this morning.   Hemodynamics and VAD parameters monitored by myself and anesthesia throughout the procedure. Blood pressures were obtained with automatic cuff on right arm and correlated with Doppler MAP.    Time: Doppler Auto  BP Flow PI Power Speed  Pre-procedure:           7:00 100 108/91 (95) 4.2 3.4 4.5 5500  Sedation induction:          07:45  113/94 (102) 4.1 3.5 4.4    8:00  94/81 (87) 4.5 2.8 4.5    8:15  94/79 (86) 4.6 2.1 4.4    08:30  99/73 (81) 4.2 4.1 4.4    08:45  104/94 (99) 4.5 2.7 4.4    09:00  97/77 (85) 4.4 2.9 4.5   Recovery Area:          09:15  116/98 (106) 4.2 3.2 4.6     Patient tolerated the procedure well. VAD Coordinator accompanied and remained with patient in recovery area. Per Dr. Donnetta Hutching Heparin gtt can be re-started in 12 hours (19:30 this evening). Reviewed with Dr. Haroldine Laws and he is in agreement to same.   Attempted to call patient's daughter x 2 with update; no answer. He will call her from room.   LVAD exit site: Existing VAD dressing removed and site care performed using sterile technique. Drive line exit site cleaned with Chlora prep applicators x 2, allowed to dry, and Sorbaview dressing with bio patch re-applied. Exit site healed and incorporated, the velour is fully implanted at exit site. No redness, tenderness, drainage, foul odor or rash noted. Drive line anchor re-applied.    Patient Disposition: Marlin RN, [place] Coordinator [phone]

## 2019-06-02 NOTE — Progress Notes (Addendum)
Patient ID: [name], male   DOB: 02-03-1966, 53 y.o.   MRN: [id number]   Advanced Heart Failure VAD Team Note  PCP-Cardiologist: No primary care provider on file.   Subjective:    Patient sleepy but arousable after returning from R AKA. Denies pain at this time.   No SOB, orthopnea or PND   LVAD INTERROGATION:  HeartMate 3 LVAD:   Flow 4.2 liters/min, speed 5500, power 4.5, PI 3.1 VAD interrogated personally. Parameters stable.   Objective:    Vital Signs:   Temp:  [97.2 F (36.2 C)-99.9 F (37.7 C)] 98.6 F (37 C) (09/27 0930) Pulse Rate:  [81-107] 92 (09/27 0930) Resp:  [12-22] 18 (09/27 0930) BP: (87-116)/(65-98) 112/89 (09/27 0948) SpO2:  [90 %-100 %] 90 % (09/27 1038) Weight:  [85.2 kg] 85.2 kg (09/27 0500) Last BM Date: 05/29/19 Mean arterial Pressure 80s   Intake/Output:   Intake/Output Summary (Last 24 hours) at 06/02/2019 1126 Last data filed at 06/02/2019 1000 Gross per 24 hour  Intake 1396.73 ml  Output 150 ml  Net 1246.73 ml     Physical Exam   General:  Chronically ill appearing NAD. Lethargic  HEENT: normal  Neck: supple. JVP 9  RIJ TLC. Carotids 2+ bilat; no bruits. No lymphadenopathy or thryomegaly appreciated. Cor: LVAD hum.  Left perm cath Lungs: Clear. Abdomen: soft, nontender, non-distended. No hepatosplenomegaly. No bruits or masses. Good bowel sounds. Driveline site clean. Anchor in place.  Extremities: no cyanosis, clubbing, rash. R AKA with dressing. Dressing on left foot Neuro: lethargic but arousable  No focal deficits. Moves all 4 without problem    Telemetry   NSR 80-90s (personally reviewed).   Labs   Basic Metabolic Panel: Recent Labs  Lab 05/29/19 0426 05/29/19 1200 05/30/19 0426 05/31/19 0500 06/01/19 0555 06/02/19 0340  NA 130* 137 130* 130* 134* 134*  K 4.2 3.3* 3.7 4.2 3.7 3.7  CL 93* 98 93* 94* 95* 96*  CO2 22  --  22 20* 25 24  GLUCOSE 242* 167* 148* 101* 131* 192*  BUN 61* 20 37* 55* 28* 42*   CREATININE 11.01* 4.40* 7.39* 9.82* 6.70* 9.72*  CALCIUM 8.9  --  8.7* 8.8* 8.8* 8.7*    Liver Function Tests: No results for input(s): AST, ALT, ALKPHOS, BILITOT, PROT, ALBUMIN in the last 168 hours. No results for input(s): LIPASE, AMYLASE in the last 168 hours. No results for input(s): AMMONIA in the last 168 hours.  CBC: Recent Labs  Lab 05/28/19 1055 05/29/19 0426 05/29/19 1200 05/30/19 0426 05/31/19 0500 06/01/19 0555 06/02/19 0340  WBC 18.6* 19.0*  --  17.9* 20.8* 23.1* 23.2*  NEUTROABS 15.5*  --   --   --   --   --   --   HGB 10.2* 9.9* 13.3 8.6* 8.4* 8.7* 8.0*  HCT 32.0* 31.3* 39.0 28.9* 28.1* 28.1* 25.5*  MCV 87.9 88.2  --  90.6 89.2 87.5 86.4  PLT 330 307  --  284 293 283 294    INR: Recent Labs  Lab 05/29/19 0426 05/30/19 0426 05/31/19 0500 06/01/19 0555 06/02/19 0340  INR 2.3* 1.9* 1.8* 1.7* 1.7*    Other results:  EKG:    Imaging   No results found.   Medications:     Scheduled Medications: . aspirin EC  81 mg Oral Daily  . atorvastatin  40 mg Oral q1800  . Chlorhexidine Gluconate Cloth  6 each Topical Daily  . Chlorhexidine Gluconate Cloth  6 each Topical Q0600  .  citalopram  20 mg Oral Daily  . doxercalciferol  2 mcg Intravenous Q M,W,F-HD  . dronabinol  2.5 mg Oral BID AC  . erythromycin  250 mg Oral TID  . gabapentin  300 mg Oral BID  . influenza vac split quadrivalent PF  0.5 mL Intramuscular Tomorrow-1000  . insulin aspart  0-9 Units Subcutaneous TID WC  . insulin aspart  7 Units Subcutaneous Q supper  . insulin glargine  10 Units Subcutaneous Daily  . metoCLOPramide  10 mg Oral TID WC  . midodrine  5 mg Oral Q M,W,F  . multivitamin  1 tablet Oral QHS  . pantoprazole  40 mg Oral BID AC  . sevelamer carbonate  2,400 mg Oral TID WC    Infusions: . sodium chloride 0 mL/hr at 05/31/19 1330  . sodium chloride    . sodium chloride    . sodium chloride 10 mL/hr at 06/01/19 1200  . sodium chloride 10 mL/hr at 06/02/19 0707  .  heparin Stopped (06/02/19 0737)  . meropenem (MERREM) IV Stopped (06/01/19 1045)  . vancomycin Stopped (05/31/19 1758)    PRN Medications: sodium chloride, sodium chloride, sodium chloride, sodium chloride, acetaminophen, alteplase, docusate sodium, fentaNYL (SUBLIMAZE) injection, heparin, lidocaine (PF), lidocaine-prilocaine, LORazepam, ondansetron (ZOFRAN) IV, oxyCODONE, pentafluoroprop-tetrafluoroeth, promethazine, promethazine   Assessment/Plan:    1. Ischemic feet/ left great toe gangrene/PAD: Now s/p left great toe amputation by Dr Trula Slade.  Also still with considerable right foot pain and unable to bear weight.  - s/p R AKA this am with Dr Donnetta Hutching - Now on meropenem/vanc. Remains with low grade temps but improved. WBC stable at 23K - [place] remain negative - Suspect will need further amputation on left - D/w Dr. Donnetta Hutching personally.  2. Chronic systolic CHF: Ischemic cardiomyopathy. [employer] ICD. NSTEMI in March 2018 with DES to LAD and RCA, complicated by cardiogenic shock, low output requiring milrinone. Echo 8/18 with EF 20-25%, moderate MR. CPX (8/18) with severe functional impairment due to HF. He is s/p HM3 LVAD placement. He had pump thrombosis 6/19 due to [facility] outflow graft kinking. He had pump exchange for another HM3. Not a candidate for transplant with PAD. Volume status managed at HD.  - Continue ASA 81. Off coumadin for surgery.  - INR.1.7. Hold heparin for 12 hours post-op then restart.  No bleeding - Continue hydralazine 25 mg tid on non-HD days.  - LDH 155 3. ESRD: HD on M-W-F with midodrine pre-HD. Had HD today.    4.CAD: NSTEMI in 3/18. LHC with 99% ulcerated lesion proximal RCA with left to right collaterals, 95% mid LAD stenosis after mid LAD stent. s/p PCI to RCA and LAD on 11/25/16.No s/s of ischemia - Continue statin and ASA. 5. h/o DVTs: Factor V Leiden heterozygote. -Anticoagulated. 6. Diabetic gastroparesis: He has seen gastroparesis specialist at [facility]. Surprisingly, gastric emptying study was normal. However, electrogastrogram showed poor gastric accomodation/capacity. Therefore, small frequent meals recommended. - Continue reglan 10 mg tid/ac and Marinol.  - Continue Protonix bid.  - He will continue erythromycinindefinitely.  - Limit narcotic use as much as possible. 7. JOA:CZYSAY today. MAPs in 80s 8. Pain: Considerable right foot pain as above and unable to bear weight.  He is on pain meds - hopefully should improve with AKA but may get worse immediately post-op 9. ID: [email or web address], suspect related to ischemic right foot, not healing well post-TMA.  - Abx expanded on 9/26 to included meropenem. Fevers improving. WBC stable at 23K - Will  continue suspect will improve after amputation    Length of Stay: 5  [name], MD 06/02/2019, 11:26 AM  VAD Team --- VAD ISSUES ONLY--- Pager [phone] (7am - 7am)  Advanced Heart Failure Team  Pager [phone] (M-F; 7a - 4p)  Please contact [facility] for night-coverage after hours (4p -7a ) and weekends on [email or web address]

## 2019-06-02 NOTE — Anesthesia Preprocedure Evaluation (Signed)
Anesthesia Evaluation  Patient identified by MRN, date of birth, ID band Patient awake    Reviewed: Allergy & Precautions, H&P , NPO status , Patient's Chart, lab work & pertinent test results  Airway Mallampati: II   Neck ROM: full    Dental   Pulmonary shortness of breath,    breath sounds clear to auscultation       Cardiovascular hypertension, + CAD, + Past MI, + Cardiac Stents, + Peripheral Vascular Disease and +CHF  + Cardiac Defibrillator  Rhythm:regular Rate:Normal  LVAD in place   Neuro/Psych    GI/Hepatic GERD  ,  Endo/Other  diabetes, Type 2  Renal/GU ESRF and DialysisRenal disease     Musculoskeletal   Abdominal   Peds  Hematology  (+) Blood dyscrasia, anemia ,   Anesthesia Other Findings   Reproductive/Obstetrics                             Anesthesia Physical Anesthesia Plan  ASA: IV  Anesthesia Plan: General   Post-op Pain Management:    Induction:   PONV Risk Score and Plan: 2 and Ondansetron, Dexamethasone, Midazolam and Treatment may vary due to age or medical condition  Airway Management Planned: LMA  Additional Equipment:   Intra-op Plan:   Post-operative Plan:   Informed Consent: I have reviewed the patients History and Physical, chart, labs and discussed the procedure including the risks, benefits and alternatives for the proposed anesthesia with the patient or authorized representative who has indicated his/her understanding and acceptance.       Plan Discussed with: CRNA, Anesthesiologist and Surgeon  Anesthesia Plan Comments:         Anesthesia Quick Evaluation

## 2019-06-02 NOTE — Progress Notes (Signed)
Pt reports pain 8/10 post OP. RN received verbal order from Dr. Donnetta Hutching to start PCA pump. RN assisted by Pharmacist in placing this order ASAP.

## 2019-06-02 NOTE — Progress Notes (Signed)
[facility] NEPHROLOGY PROGRESS NOTE  Assessment/ Plan: Pt is a 53 y.o. yo male ESRD on HD, CAD, severe ICM status post LVAD, DM, A. fib admitted for amputation of left great toe.   Physical Exam: General:NAD, comfortable Heart: Mechanical sound of LVAD device Lungs:clear b/l, no crackle Abdomen:soft, Non-tender, non-distended Extremities:No edema, dressing applied to the amputation site Dialysis Access: Left IJ [facility]  Dialysis:[facility] MWF  4h 67mn     400/800   84kg   2K/3Ca   Hep none   TDC Venofer 1058mIV x 5 until 9/28 Mircera 200 mcg IV q 2 weeks (last 9/16)  Hectorol 29m69mIV TIW   Assessment/ Plan #  PAD bilateral LE's: SP L great toe amp on 9/23.  Nonhealing R TMA > underwent R AKA today 9/27.  # ESRD -HD MWF.  Next HD Monday.   # Hypertension/volume - BP controlled. No volume excess on exam. At dry wt. Dry wt will need lowering post AKA (approx 3 kg).   # Anemia -drop in hemoglobin likely due to blood loss during surgery . On ESA as outpatient, last Mircera on 9/16. Next esa ordered for 9/30 here.   # Metabolic bone disease -Continue VDRA/binders.   # CAD/ICM s/p LVAD - per HF team   RobKelly SplinterD 06/02/2019, 12:20 PM      Subjective: Seen and examined in pt's room, no new c/o's.   Objective Vital signs in last 24 hours: Vitals:   06/02/19 0915 06/02/19 0930 06/02/19 0948 06/02/19 1038  BP: 111/65 114/76 112/89   Pulse: 86 92    Resp: 15 18    Temp:  98.6 F (37 C)    TempSrc:      SpO2: 100% 91% 90% 90%  Weight:      Height:       Weight change: -0.9 kg  Intake/Output Summary (Last 24 hours) at 06/02/2019 1220 Last data filed at 06/02/2019 1000 Gross per 24 hour  Intake 1010 ml  Output 150 ml  Net 860 ml       Labs: Basic Metabolic Panel: Recent Labs  Lab 05/31/19 0500 06/01/19 0555 06/02/19 0340  NA 130* 134* 134*  K 4.2 3.7 3.7  CL 94* 95* 96*  CO2 20* 25 24  GLUCOSE 101* 131* 192*  BUN 55* 28* 42*   CREATININE 9.82* 6.70* 9.72*  CALCIUM 8.8* 8.8* 8.7*   Liver Function Tests: No results for input(s): AST, ALT, ALKPHOS, BILITOT, PROT, ALBUMIN in the last 168 hours. No results for input(s): LIPASE, AMYLASE in the last 168 hours. No results for input(s): AMMONIA in the last 168 hours. CBC: Recent Labs  Lab 05/28/19 1055 05/29/19 0426  05/30/19 0426 05/31/19 0500 06/01/19 0555 06/02/19 0340  WBC 18.6* 19.0*  --  17.9* 20.8* 23.1* 23.2*  NEUTROABS 15.5*  --   --   --   --   --   --   HGB 10.2* 9.9*   < > 8.6* 8.4* 8.7* 8.0*  HCT 32.0* 31.3*   < > 28.9* 28.1* 28.1* 25.5*  MCV 87.9 88.2  --  90.6 89.2 87.5 86.4  PLT 330 307  --  284 293 283 294   < > = values in this interval not displayed.   Cardiac Enzymes: No results for input(s): CKTOTAL, CKMB, CKMBINDEX, TROPONINI in the last 168 hours. CBG: Recent Labs  Lab 06/01/19 1139 06/01/19 1622 06/01/19 2134 06/02/19 0613 06/02/19 0910  GLUCAP 238* 169* 102* 198* 151*  Iron Studies: No results for input(s): IRON, TIBC, TRANSFERRIN, FERRITIN in the last 72 hours. Studies/Results: No results found.  Medications: Infusions: . sodium chloride 0 mL/hr at 05/31/19 1330  . sodium chloride    . sodium chloride    . sodium chloride 10 mL/hr at 06/01/19 1200  . sodium chloride 10 mL/hr at 06/02/19 0707  . heparin    . meropenem (MERREM) IV Stopped (06/01/19 1045)  . vancomycin Stopped (05/31/19 1758)    Scheduled Medications: . aspirin EC  81 mg Oral Daily  . atorvastatin  40 mg Oral q1800  . Chlorhexidine Gluconate Cloth  6 each Topical Daily  . Chlorhexidine Gluconate Cloth  6 each Topical Q0600  . citalopram  20 mg Oral Daily  . doxercalciferol  2 mcg Intravenous Q M,W,F-HD  . dronabinol  2.5 mg Oral BID AC  . erythromycin  250 mg Oral TID  . gabapentin  300 mg Oral BID  . influenza vac split quadrivalent PF  0.5 mL Intramuscular Tomorrow-1000  . insulin aspart  0-9 Units Subcutaneous TID WC  . insulin aspart  7  Units Subcutaneous Q supper  . insulin glargine  10 Units Subcutaneous Daily  . metoCLOPramide  10 mg Oral TID WC  . midodrine  5 mg Oral Q M,W,F  . morphine   Intravenous Q4H  . multivitamin  1 tablet Oral QHS  . pantoprazole  40 mg Oral BID AC  . sevelamer carbonate  2,400 mg Oral TID WC    have reviewed scheduled and prn medications.

## 2019-06-02 NOTE — Op Note (Signed)
    OPERATIVE REPORT  DATE OF SURGERY: 06/02/2019  PATIENT: [name], 53 y.o. male MRN: [id number]  DOB: 02/04/1966  PRE-OPERATIVE DIAGNOSIS: Gangrene right foot  POST-OPERATIVE DIAGNOSIS:  Same  PROCEDURE: Right above-knee amputation  SURGEON:  Curt Jews, M.D.  PHYSICIAN ASSISTANT: Nurse  ANESTHESIA: General  EBL: per anesthesia record  Total I/O In: 650 [I.V.:400; IV Piggyback:250] Out: 150 [Blood:150]  BLOOD ADMINISTERED: none  DRAINS: none  SPECIMEN: none  COUNTS CORRECT:  YES  PATIENT DISPOSITION:  PACU - hemodynamically stable  PROCEDURE DETAILS: Patient was taken operating placed to position with area of the right leg prepped and draped in usual sterile fashion.  [place] incision was made just above the knee and carried down through the subcutaneous fat and fascia with electrocautery.  The muscle bodies all appear to be viable.  The muscles were divided in line with the skin incision.  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  This was ligated and divided.  The supra femoral artery was ligated and divided.  The sciatic nerve was ligated proximally and divided.  The periosteum was elevated off the femur and the femur was divided with a Gigli saw.  The bone edges were smoothed with bone rasp.  The wounds were irrigated with saline and hemostasis electrocautery.  The wounds were closed with 0 Vicryl figure-of-eight sutures in the fascia to close the anterior fascia to the posterior fascia.  Skin was closed with skin staples.  Sterile dressing was applied and the patient was transferred to the recovery room in stable condition   [name], M.D., [name] 06/02/2019 10:34 AM

## 2019-06-02 NOTE — Transfer of Care (Signed)
Immediate Anesthesia Transfer of Care Note  Patient: Jasean E Golomb  Procedure(s) Performed: ABOVE KNEE AMPUTATION (Right )  Patient Location: PACU  Anesthesia Type:General  Level of Consciousness: drowsy and patient cooperative  Airway & Oxygen Therapy: Patient Spontanous Breathing and Patient connected to face mask oxygen  Post-op Assessment: Report given to RN, Post -op Vital signs reviewed and stable and Patient moving all extremities X 4  Post vital signs: Reviewed and stable  Last Vitals:  Vitals Value Taken Time  BP 116/98 06/02/19 0909  Temp    Pulse    Resp 13 06/02/19 0910  SpO2    Vitals shown include unvalidated device data.  Last Pain:  Vitals:   06/02/19 0300  TempSrc: Oral  PainSc: 0-No pain      Patients Stated Pain Goal: 3 (26/41/58 3094)  Complications: No apparent anesthesia complications   SpO2 picked up in PACU, sat was 100%

## 2019-06-02 NOTE — Interval H&P Note (Signed)
History and Physical Interval Note:  06/02/2019 7:29 AM  [name]  has presented today for surgery, with the diagnosis of right foot gangrene.  The various methods of treatment have been discussed with the patient and family. After consideration of risks, benefits and other options for treatment, the patient has consented to  Procedure(s): [place] (Right) as a surgical intervention.  The patient's history has been reviewed, patient examined, no change in status, stable for surgery.  I have reviewed the patient's chart and labs.  Questions were answered to the patient's satisfaction.     Curt Jews

## 2019-06-03 ENCOUNTER — Encounter (HOSPITAL_COMMUNITY): Payer: Self-pay | Admitting: Vascular Surgery

## 2019-06-03 LAB — CBC
HCT: 23.4 % — ABNORMAL LOW (ref 39.0–52.0)
Hemoglobin: 7.1 g/dL — ABNORMAL LOW (ref 13.0–17.0)
MCH: 26.1 pg (ref 26.0–34.0)
MCHC: 30.3 g/dL (ref 30.0–36.0)
MCV: 86 fL (ref 80.0–100.0)
Platelets: 366 10*3/uL (ref 150–400)
RBC: 2.72 MIL/uL — ABNORMAL LOW (ref 4.22–5.81)
RDW: 17.9 % — ABNORMAL HIGH (ref 11.5–15.5)
WBC: 32.5 10*3/uL — ABNORMAL HIGH (ref 4.0–10.5)
nRBC: 0 % (ref 0.0–0.2)

## 2019-06-03 LAB — GLUCOSE, CAPILLARY
Glucose-Capillary: 324 mg/dL — ABNORMAL HIGH (ref 70–99)
Glucose-Capillary: 162 mg/dL — ABNORMAL HIGH (ref 70–99)
Glucose-Capillary: 194 mg/dL — ABNORMAL HIGH (ref 70–99)
Glucose-Capillary: 138 mg/dL — ABNORMAL HIGH (ref 70–99)

## 2019-06-03 LAB — BASIC METABOLIC PANEL
Anion gap: 17 — ABNORMAL HIGH (ref 5–15)
BUN: 53 mg/dL — ABNORMAL HIGH (ref 6–20)
CO2: 14 mmol/L — ABNORMAL LOW (ref 22–32)
Calcium: 7.2 mg/dL — ABNORMAL LOW (ref 8.9–10.3)
Chloride: 105 mmol/L (ref 98–111)
Creatinine, Ser: 10.1 mg/dL — ABNORMAL HIGH (ref 0.61–1.24)
GFR calc Af Amer: 6 mL/min — ABNORMAL LOW (ref >60)
GFR calc non Af Amer: 5 mL/min — ABNORMAL LOW (ref >60)
Glucose, Bld: 278 mg/dL — ABNORMAL HIGH (ref 70–99)
Potassium: 3.7 mmol/L (ref 3.5–5.1)
Sodium: 136 mmol/L (ref 135–145)

## 2019-06-03 LAB — PROTIME-INR
INR: 1.7 — ABNORMAL HIGH (ref 0.8–1.2)
Prothrombin Time: 19.9 seconds — ABNORMAL HIGH (ref 11.4–15.2)

## 2019-06-03 LAB — HEPARIN LEVEL (UNFRACTIONATED): Heparin Unfractionated: 0.46 IU/mL (ref 0.30–0.70)

## 2019-06-03 LAB — LACTATE DEHYDROGENASE: LDH: 158 U/L (ref 98–192)

## 2019-06-03 MED ORDER — ONDANSETRON HCL 4 MG/2ML IJ SOLN
INTRAMUSCULAR | Status: AC
  Filled 2019-06-03: qty 2

## 2019-06-03 MED ORDER — HEPARIN SODIUM (PORCINE) 1000 UNIT/ML IJ SOLN
3400.0000 [IU] | Freq: Once | INTRAMUSCULAR | Status: AC
  Administered 2019-06-03: 3400 [IU]

## 2019-06-03 MED ORDER — VANCOMYCIN HCL IN DEXTROSE 1-5 GM/200ML-% IV SOLN
INTRAVENOUS | Status: AC
  Filled 2019-06-03: qty 200

## 2019-06-03 MED ORDER — DOXERCALCIFEROL 4 MCG/2ML IV SOLN
INTRAVENOUS | Status: AC
  Filled 2019-06-03: qty 2

## 2019-06-03 MED ORDER — MIDODRINE HCL 5 MG PO TABS
ORAL_TABLET | ORAL | Status: AC
  Filled 2019-06-03: qty 1

## 2019-06-03 MED ORDER — HEPARIN SODIUM (PORCINE) 1000 UNIT/ML IJ SOLN
INTRAMUSCULAR | Status: AC
  Filled 2019-06-03: qty 4

## 2019-06-03 NOTE — Progress Notes (Signed)
PT Cancellation Note  Patient Details Name: ADVIT TRETHEWEY MRN: [id number] DOB: 1965-12-17   Cancelled Treatment:    Reason Eval/Treat Not Completed: Patient at procedure or test/unavailable(Pt is in HD.  Will return as able.  thanks. )   Denice Paradise 06/03/2019, 9:38 AM [facility] Pager:  [phone]  Office:  [phone]

## 2019-06-03 NOTE — Progress Notes (Addendum)
Physical Therapy Re-evaluation Patient Details Name: [name] MRN: [id number] DOB: 1966-06-14 Today's Date: 06/03/2019    History of Present Illness Pt is a 53 y.o. male with hx of LVAD, ESRD, DM, CAD, HLD who is s/p right TMA on 04/30/2019 by Dr. Donzetta Matters for non healing amp site.  He had previously undergone ray amp of right 4th toes on 03/21/2019 by Dr. Donzetta Matters. He was seen 05/21/2019 by Dr. Aundra Dubin and was having difficulty walking due to pain in his right foot and is unable to put any weight on this foot. 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    PT Comments    Pt admitted with above diagnosis. Pt now with right AKA.  Pt agreed to sit EOB as he just returned from dialysis. Sitting balance good at EOB with min guard assist.  Pt did some exercises as well.  Pt limited by pain somewhat.  Pt will need a ramp at d/c as he will be wheelchair dependent while healing.    Plan is for SNF.  Pt goals revised due to new right AKA and ambulation goal not appropriate.  Pt currently with functional limitations due to balance and endurance deficits. Pt will benefit from skilled PT to increase their independence and safety with mobility to allow discharge to the venue listed below.     Follow Up Recommendations  SNF;Supervision/Assistance - 24 hour     Equipment Recommendations  Ramp    Recommendations for Other Services       Precautions / Restrictions Precautions Precautions: Fall;Other (comment) Precaution Comments: LVAD Other Brace: Darco shoe-- left foot Restrictions Weight Bearing Restrictions: Yes RLE Weight Bearing: Non weight bearing(Right AKA) Other Position/Activity Restrictions:  pt to weight bear on left heel with Darco shoe    Mobility  Bed Mobility Overal bed mobility: Needs Assistance Bed Mobility: Sit to Supine;Supine to Sit     Supine to sit: Min assist Sit to supine: Min assist   General bed mobility comments: Assist to initiate movement to  EOB. Assist to support RLE with transfer back to bed  Transfers                    Ambulation/Gait                 Stairs             Wheelchair Mobility    Modified Rankin (Stroke Patients Only)       Balance Overall balance assessment: Needs assistance Sitting-balance support: No upper extremity supported;Feet supported Sitting balance-[name]: Fair Sitting balance - Comments: sat EOb 15 min doing exercises with min guard assist                                    Cognition Arousal/Alertness: Awake/alert Behavior During Therapy: Flat affect Overall Cognitive Status: Impaired/Different from baseline Area of Impairment: Following commands;Safety/judgement;Problem solving                       Following Commands: Follows one step commands consistently;Follows multi-step commands inconsistently Safety/Judgement: Decreased awareness of safety;Decreased awareness of deficits   Problem Solving: Slow processing;Decreased initiation;Difficulty sequencing;Requires verbal cues;Requires tactile cues        Exercises General Exercises - Lower Extremity Long Arc Quad: AROM;10 reps;Seated;Left Amputee Exercises Quad Sets: AROM;Right;5 reps;Supine Hip ABduction/ADduction: AROM;Right;10 reps;Seated Hip Flexion/Marching: AROM;Right;10 reps;Seated  General Comments General comments (skin integrity, edema, etc.): Pt now new right AKA.  Will need ramp. Strength in right hip residual limb grossly 3-/5      Pertinent Vitals/Pain Pain Assessment: Faces Faces Pain Scale: Hurts worst Pain Location: Right residual limb Pain Descriptors / Indicators: Grimacing;Guarding;Aching;Sharp Pain Intervention(s): Limited activity within patient's tolerance;Monitored during session;Premedicated before session;Repositioned;PCA encouraged    Home Living                      Prior Function            PT Goals (current goals can now be  found in the care plan section) Acute Rehab PT Goals Patient Stated Goal: to go home  PT Goal Formulation: With patient Time For Goal Achievement: 06/17/19 Potential to Achieve Goals: Good Progress towards PT goals: Goals downgraded-see care plan(Now right AKA and ambulation no longer appropriate)    Frequency    Min 3X/week      PT Plan Current plan remains appropriate    Co-evaluation              AM-PAC PT "6 Clicks" Mobility   Outcome Measure  Help needed turning from your back to your side while in a flat bed without using bedrails?: A Lot Help needed moving from lying on your back to sitting on the side of a flat bed without using bedrails?: A Lot Help needed moving to and from a bed to a chair (including a wheelchair)?: Total Help needed standing up from a chair using your arms (e.g., wheelchair or bedside chair)?: Total Help needed to walk in hospital room?: Total Help needed climbing 3-5 steps with a railing? : Total 6 Click Score: 8    End of Session Equipment Utilized During Treatment: Gait belt Activity Tolerance: Patient limited by fatigue;Patient limited by pain Patient left: with call bell/phone within reach;in bed;with bed alarm set Nurse Communication: Mobility status PT Visit Diagnosis: Other abnormalities of gait and mobility (R26.89);Pain;Unsteadiness on feet (R26.81) Pain - Right/Left: Right Pain - part of body: Ankle and joints of foot     Time: 9937-1696 PT Time Calculation (min) (ACUTE ONLY): 23 min  Charges:  $Therapeutic Activity: 8-22 mins             Re-evaluation [place] Pager:  [phone]  Office:  [phone]     Denice Paradise 06/03/2019, 2:37 PM

## 2019-06-03 NOTE — Progress Notes (Signed)
ANTICOAGULATION CONSULT NOTE - Follow Up Consult  Pharmacy Consult for Heparin (Warfarin on hold) Indication: LVAD HM3  Allergies  Allergen Reactions  . Metformin And Related Diarrhea    Patient Measurements: Height: 5' 7.99" (172.7 cm) Weight: 184 lb 8.4 oz (83.7 kg) IBW/kg (Calculated) : 68.38   Vital Signs: Temp: 98.9 F (37.2 C) (09/28 0325) Temp Source: Oral (09/28 0325) BP: 126/98 (09/28 0325) Pulse Rate: 89 (09/28 0325)  Labs: Recent Labs    06/01/19 0555 06/02/19 0340 06/02/19 1731 06/03/19 0412  HGB 8.7* 8.0* 7.7*  --   HCT 28.1* 25.5* 25.4*  --   PLT 283 294 347  --   LABPROT 20.1* 20.0*  --  19.9*  INR 1.7* 1.7*  --  1.7*  HEPARINUNFRC 0.35 0.32  --  0.46  CREATININE 6.70* 9.72* 10.59*  --     Estimated Creatinine Clearance: 8.4 mL/min (A) (by C-G formula based on SCr of 10.59 mg/dL (H)).   Medical History: Past Medical History:  Diagnosis Date  . Angina   . ASCVD (arteriosclerotic cardiovascular disease)    , Anterior infarction 2005, LAD diagonal bifurcation intervention 03/2004  . Automatic implantable cardiac defibrillator -[employer]. Jude's       . Benign neoplasm of colon   . CHF (congestive heart failure) ([place])   . Chronic systolic heart failure ([place])   . Coronary artery disease     Widely patent previously placed stents in the left anterior   . Crohn's disease ([place])   . Deep venous thrombosis (HCC)    Recurrent-on Coumadin  . Dialysis patient ([place])   . Dyspnea   . Gastroparesis   . GERD (gastroesophageal reflux disease)   . High cholesterol   . Hyperlipidemia   . Hypersomnolent    Previous diagnosis of narcolepsy  . Hypertension, essential   . Ischemic cardiomyopathy    Ejection fraction 15-20% catheterization 2010  . Type II or unspecified type diabetes mellitus without mention of complication, not stated as uncontrolled   . Unspecified gastritis and gastroduodenitis without mention of hemorrhage     Assessment: 56 yoM on warfarin  PTA for LVAD and hx FV Leiden admitted for ongoing foot ischemia. 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 May need further intervention (L AKA) by [facility] so will continue heparin and hold warfarin for now. Heparin drip 1350 uts/hr HL 0.47 at goal  PTA warfarin 7.74m Tu/Fr 562mQOD   Goal of Therapy:  INR 2-3  Heparin Level 0.3-0.7 units/mL Monitor platelets by anticoagulation protocol: Yes   Plan:  Cont heparin at 1350 units/hr Daily HL, Protime and CBC  LiBonnita Nasutiharm.D. CPP, BCPS Clinical Pharmacist [phone]/28/2020 8:22 AM

## 2019-06-03 NOTE — Progress Notes (Signed)
ANTICOAGULATION CONSULT NOTE - Follow Up Consult  Pharmacy Consult for Heparin (Warfarin on hold) Indication: LVAD HM3  Allergies  Allergen Reactions  . Metformin And Related Diarrhea    Patient Measurements: Height: 5' 7.99" (172.7 cm) Weight: 187 lb 13.3 oz (85.2 kg) IBW/kg (Calculated) : 68.38   Vital Signs: Temp: 98.9 F (37.2 C) (09/28 0325) Temp Source: Oral (09/28 0325) BP: 126/98 (09/28 0325) Pulse Rate: 89 (09/28 0325)  Labs: Recent Labs    06/01/19 0555 06/02/19 0340 06/02/19 1731 06/03/19 0412  HGB 8.7* 8.0* 7.7*  --   HCT 28.1* 25.5* 25.4*  --   PLT 283 294 347  --   LABPROT 20.1* 20.0*  --  19.9*  INR 1.7* 1.7*  --  1.7*  HEPARINUNFRC 0.35 0.32  --  0.46  CREATININE 6.70* 9.72* 10.59*  --     Estimated Creatinine Clearance: 8.5 mL/min (A) (by C-G formula based on SCr of 10.59 mg/dL (H)).   Medical History: Past Medical History:  Diagnosis Date  . Angina   . ASCVD (arteriosclerotic cardiovascular disease)    , Anterior infarction 2005, LAD diagonal bifurcation intervention 03/2004  . Automatic implantable cardiac defibrillator -[employer]. Jude's       . Benign neoplasm of colon   . CHF (congestive heart failure) ([place])   . Chronic systolic heart failure ([place])   . Coronary artery disease     Widely patent previously placed stents in the left anterior   . Crohn's disease ([place])   . Deep venous thrombosis (HCC)    Recurrent-on Coumadin  . Dialysis patient ([place])   . Dyspnea   . Gastroparesis   . GERD (gastroesophageal reflux disease)   . High cholesterol   . Hyperlipidemia   . Hypersomnolent    Previous diagnosis of narcolepsy  . Hypertension, essential   . Ischemic cardiomyopathy    Ejection fraction 15-20% catheterization 2010  . Type II or unspecified type diabetes mellitus without mention of complication, not stated as uncontrolled   . Unspecified gastritis and gastroduodenitis without mention of hemorrhage     Assessment: 45 yoM on  warfarin PTA for LVAD and hx FV Leiden admitted for ongoing foot ischemia. 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 May need further intervention (L AKA) by VVS so will continue heparin and hold warfarin for now. Heparin ok to resume 12 hours postop today ~2100  Heparin drip 1350 uts/hr - heparin level = 0.32 this AM prior to OR  PTA warfarin 7.46m Tu/Fr 566mQOD  9/28 AM update:  Heparin level therapeutic x 1 after post-op re-start, INR 1.7  Goal of Therapy:  INR 2-3  Heparin Level 0.3-0.7 units/mL Monitor platelets by anticoagulation protocol: Yes   Plan:  Cont heparin at 1350 units/hr Confirmatory heparin level at 12MadisonPharmD, BCPowderlyharmacist Phone: [phone]

## 2019-06-03 NOTE — Plan of Care (Signed)

## 2019-06-03 NOTE — Plan of Care (Signed)
  Problem: Health Behavior/Discharge Planning: Goal: Ability to manage health-related needs will improve Outcome: Progressing   Problem: Clinical Measurements: Goal: Ability to maintain clinical measurements within normal limits will improve Outcome: Progressing Goal: Will remain free from infection Outcome: Progressing Goal: Diagnostic test results will improve Outcome: Progressing Goal: Respiratory complications will improve Outcome: Progressing Goal: Cardiovascular complication will be avoided Outcome: Progressing   Problem: Activity: Goal: Risk for activity intolerance will decrease Outcome: Progressing   Problem: Activity: Goal: Risk for activity intolerance will decrease Outcome: Progressing   Problem: Coping: Goal: Level of anxiety will decrease Outcome: Progressing   Problem: Elimination: Goal: Will not experience complications related to bowel motility Outcome: Progressing   Problem: Health Behavior/Discharge Planning: Goal: Ability to manage health-related needs will improve Outcome: Progressing   Problem: Clinical Measurements: Goal: Ability to maintain clinical measurements within normal limits will improve Outcome: Progressing Goal: Will remain free from infection Outcome: Progressing Goal: Diagnostic test results will improve Outcome: Progressing Goal: Respiratory complications will improve Outcome: Progressing Goal: Cardiovascular complication will be avoided Outcome: Progressing   Problem: Activity: Goal: Risk for activity intolerance will decrease Outcome: Progressing

## 2019-06-03 NOTE — Progress Notes (Signed)
[facility] NEPHROLOGY PROGRESS NOTE  Assessment/ Plan: Pt is a 53 y.o. yo male ESRD on HD, CAD, severe ICM status post LVAD, DM, A. fib admitted for amputation of left great toe.   Physical Exam: General:NAD, comfortable Heart: Mechanical sound of LVAD device Lungs:clear b/l, no crackle Abdomen:soft, Non-tender, non-distended Extremities:No edema, dressing applied to the amputation site Dialysis Access: Left IJ [facility]  Dialysis:[facility] MWF  4h 27mn     400/800   84kg   2K/3Ca   Hep none   TDC Venofer 1075mIV x 5 until 9/28 Mircera 200 mcg IV q 2 weeks (last 9/16)  Hectorol 81m18mIV TIW   Assessment/ Plan #  PAD bilateral LE's: SP L great toe amp on 9/23.  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    # ESRD -HD MWF.  HD today  # Hypertension/volume - BP controlled. No volume excess on exam. At dry wt. Dry wt will need lowering post AKA (approx 3 kg).  UF 1-2kg w/ HD today.   # Anemia -drop in hemoglobin likely due to blood loss during surgery . On ESA as outpatient, last Mircera on 9/16. Next esa ordered for 9/30 here.   # Metabolic bone disease -Continue VDRA/binders.    # CAD/ICM s/p LVAD - per HF team   RobKelly SplinterD 06/03/2019, 1:48 PM      Subjective:  Seen at HD, no new c/o  Objective Vital signs in last 24 hours: Vitals:   06/03/19 1230 06/03/19 1248 06/03/19 1327 06/03/19 1346  BP: 115/80 (!) 144/80 126/89   Pulse: 98 99    Resp: 18 18  (!) 22  Temp:  98.3 F (36.8 C)    TempSrc:  Oral    SpO2:    99%  Weight:      Height:       Weight change: -1.5 kg  Intake/Output Summary (Last 24 hours) at 06/03/2019 1348 Last data filed at 06/03/2019 1248 Gross per 24 hour  Intake 360 ml  Output 1501 ml  Net -1141 ml       Labs: Basic Metabolic Panel: Recent Labs  Lab 06/02/19 0340 06/02/19 1731 06/03/19 0800  NA 134* 133* 136  K 3.7 4.7 3.7  CL 96* 95* 105  CO2 24 20* 14*  GLUCOSE 192* 245* 278*  BUN 42* 50* 53*   CREATININE 9.72* 10.59* 10.10*  CALCIUM 8.7* 8.8* 7.2*  PHOS  --  8.5*  --    Liver Function Tests: Recent Labs  Lab 06/02/19 1731  ALBUMIN 2.2*   No results for input(s): LIPASE, AMYLASE in the last 168 hours. No results for input(s): AMMONIA in the last 168 hours. CBC: Recent Labs  Lab 05/28/19 1055  05/31/19 0500 06/01/19 0555 06/02/19 0340 06/02/19 1731 06/03/19 0800  WBC 18.6*   < > 20.8* 23.1* 23.2* 27.6* 32.5*  NEUTROABS 15.5*  --   --   --   --   --   --   HGB 10.2*   < > 8.4* 8.7* 8.0* 7.7* 7.1*  HCT 32.0*   < > 28.1* 28.1* 25.5* 25.4* 23.4*  MCV 87.9   < > 89.2 87.5 86.4 87.3 86.0  PLT 330   < > 293 283 294 347 366   < > = values in this interval not displayed.   Cardiac Enzymes: No results for input(s): CKTOTAL, CKMB, CKMBINDEX, TROPONINI in the last 168 hours. CBG: Recent Labs  Lab 06/02/19 0910 06/02/19 1616 06/02/19 2201  06/03/19 0621 06/03/19 1326  GLUCAP 151* 231* 219* 324* 162*    Iron Studies: No results for input(s): IRON, TIBC, TRANSFERRIN, FERRITIN in the last 72 hours. Studies/Results: No results found.  Medications: Infusions: . sodium chloride 10 mL/hr at 06/01/19 1200  . sodium chloride 10 mL/hr at 06/02/19 0707  . heparin 1,350 Units/hr (06/02/19 2056)  . meropenem (MERREM) IV 500 mg (06/03/19 1343)  . vancomycin Stopped (06/03/19 1330)    Scheduled Medications: . aspirin EC  81 mg Oral Daily  . atorvastatin  40 mg Oral q1800  . Chlorhexidine Gluconate Cloth  6 each Topical Daily  . Chlorhexidine Gluconate Cloth  6 each Topical Q0600  . Chlorhexidine Gluconate Cloth  6 each Topical Q0600  . citalopram  20 mg Oral Daily  . [START ON 06/05/2019] darbepoetin (ARANESP) injection - DIALYSIS  200 mcg Intravenous Q Wed-HD  . doxercalciferol  2 mcg Intravenous Q M,W,F-HD  . dronabinol  2.5 mg Oral BID AC  . erythromycin  250 mg Oral TID  . fentaNYL   Intravenous Q4H  . gabapentin  300 mg Oral BID  . influenza vac split  quadrivalent PF  0.5 mL Intramuscular Tomorrow-1000  . insulin aspart  0-9 Units Subcutaneous TID WC  . insulin aspart  7 Units Subcutaneous Q supper  . insulin glargine  10 Units Subcutaneous Daily  . metoCLOPramide  10 mg Oral TID WC  . midodrine  5 mg Oral Q M,W,F  . multivitamin  1 tablet Oral QHS  . pantoprazole  40 mg Oral BID AC  . sevelamer carbonate  2,400 mg Oral TID WC    have reviewed scheduled and prn medications.

## 2019-06-03 NOTE — Progress Notes (Signed)
  Progress Note    06/03/2019 5:25 PM 1 Day Post-Op  Subjective: Having some right stump pain and left foot pain  Vitals:   06/03/19 1358 06/03/19 1645  BP: (!) 160/99 (!) 135/108  Pulse:    Resp:    Temp:    SpO2:      Physical Exam: Awake alert and oriented Right above-knee amputation site with oozing redressed at bedside all appears viable Left first toe amputation site does have some dark discoloration but no evidence of infection and wound is intact  CBC    Component Value Date/Time   WBC 32.5 (H) 06/03/2019 0800   RBC 2.72 (L) 06/03/2019 0800   HGB 7.1 (L) 06/03/2019 0800   HGB 11.7 (L) 09/07/2016 1457   HCT 23.4 (L) 06/03/2019 0800   HCT 37.5 09/07/2016 1457   PLT 366 06/03/2019 0800   PLT 279 09/07/2016 1457   MCV 86.0 06/03/2019 0800   MCV 81 09/07/2016 1457   MCH 26.1 06/03/2019 0800   MCHC 30.3 06/03/2019 0800   RDW 17.9 (H) 06/03/2019 0800   RDW 14.9 09/07/2016 1457   LYMPHSABS 1.0 05/28/2019 1055   LYMPHSABS 1.3 09/07/2016 1457   MONOABS 1.5 (H) 05/28/2019 1055   EOSABS 0.4 05/28/2019 1055   EOSABS 0.2 09/07/2016 1457   BASOSABS 0.1 05/28/2019 1055   BASOSABS 0.0 09/07/2016 1457    BMET    Component Value Date/Time   NA 136 06/03/2019 0800   NA 144 05/11/2017 0938   K 3.7 06/03/2019 0800   CL 105 06/03/2019 0800   CO2 14 (L) 06/03/2019 0800   GLUCOSE 278 (H) 06/03/2019 0800   BUN 53 (H) 06/03/2019 0800   BUN 68 (H) 05/11/2017 0938   CREATININE 10.10 (H) 06/03/2019 0800   CREATININE 1.63 (H) 11/09/2016 1505   CALCIUM 7.2 (L) 06/03/2019 0800   CALCIUM 8.7 06/27/2017 1152   GFRNONAA 5 (L) 06/03/2019 0800   GFRNONAA 48 (L) 11/09/2016 1505   GFRAA 6 (L) 06/03/2019 0800   GFRAA 55 (L) 11/09/2016 1505    INR    Component Value Date/Time   INR 1.7 (H) 06/03/2019 0412     Intake/Output Summary (Last 24 hours) at 06/03/2019 1725 Last data filed at 06/03/2019 1248 Gross per 24 hour  Intake -  Output 1501 ml  Net -1501 ml      Assessment/plan:  53 y.o. male status post right above-knee amputation left first toe amputation.  Left first toe amputation site marginally healing does not appear to be source of leukocytosis at this time.  Will remain high risk for left leg amputation as well.  Right amputation site is oozing as expected on anticoagulation.  Karyme Mcconathy C. Donzetta Matters, MD [facility] Office: [phone] Pager: [phone]  06/03/2019 5:25 PM

## 2019-06-03 NOTE — Progress Notes (Signed)
LVAD Coordinator Rounding Note:  Admitted 05/28/19 per Dr. Aundra Dubin for heparin bridge for left toe amputation.    HM III LVAD implanted on 06/12/17 by Dr. Darcey Nora under Destination Therapy criteria. Pump exchange performed on 03/01/18 per Dr. Darcey Nora for pump thrombus.   Pt lying in bed sleeping; recently returned from dialysis.   Vital signs: Temp: 98.3 HR: 86 Doppler Pressure: 102 Automatic BP: 126/89 (101) O2 Sat: 99% RA  Wt: 184>186.9>184.5 lbs   LVAD interrogation reveals:  Speed: 5500 Flow: 3.8 Power: 4.5w PI: 5.9 Alarms: none Events:  none Hematocrit: 23  Fixed speed: 5500 Low speed limit: 5200   Drive Line: Dressing C/D/I; anchor intact and accurately applied. Weekly dressing changes per bedside nurse; next dressing change due 06/09/19.  Labs:  LDH trend: 200>186>156>159>174>155>158  INR trend: 2.5>2.3>1.9>1.8>1.7>1.7>1.7  Anticoagulation Plan: -INR Goal: 2.0 - 3.0 -ASA Dose: 81 mg daily   Device: St Jude dual ICD - Therapies: on 200 bpm - Last check: 01/01/18 - 2 hrs Afl/Afib; EMI 04/02/18   Infection:  - BC x 2 on 05/30/19>>pending  Plan/Recommendations:  1. Call VAD pager if any questions about VAD equipment or drive line.   [name] RN [place] Coordinator  Office: [phone]  24/7 Pager: [phone]

## 2019-06-03 NOTE — Consult Note (Signed)
[place] Nurse wound consult note Reason for Consult: Consult for BLE requested prior to vascular team involvement.  Pt went for amputation surgery on 9/27; please refer to their team for further plan of care and topical treatment orders.  Please re-consult if further assistance is needed.  Thank-you,  Julien Girt MSN, [place], [place], [place], [place]

## 2019-06-03 NOTE — Progress Notes (Addendum)
Patient ID: [name], male   DOB: October 11, 1965, 53 y.o.   MRN: [id number]   Advanced Heart Failure VAD Team Note  PCP-Cardiologist: No primary care provider on file.   Subjective:    9/27 S/P RAKA   Complaining of nausea. Denies SOB.    LVAD INTERROGATION:  HeartMate 3 LVAD:   Flow 4.0 liters/min, speed 5500, power 5 PI 4.4  VAD interrogated personally. Parameters stable.   Objective:    Vital Signs:   Temp:  [97.2 F (36.2 C)-98.9 F (37.2 C)] 98.9 F (37.2 C) (09/28 0325) Pulse Rate:  [81-93] 89 (09/28 0325) Resp:  [12-25] 24 (09/28 0331) BP: (111-143)/(65-104) 126/98 (09/28 0325) SpO2:  [90 %-100 %] 94 % (09/28 0325) Weight:  [83.7 kg] 83.7 kg (09/28 0542) Last BM Date: 05/29/19 Mean arterial Pressure 100s   Intake/Output:   Intake/Output Summary (Last 24 hours) at 06/03/2019 0737 Last data filed at 06/03/2019 0000 Gross per 24 hour  Intake 1010 ml  Output 151 ml  Net 859 ml     Physical Exam   Physical Exam: GENERAL- Appears chronically ill.  HEENT: normal  NECK: Supple, JVP elevated .  2+ bilaterally, no bruits.  No lymphadenopathy or thyromegaly appreciated.  RIJ CARDIAC:  Mechanical heart sounds with LVAD hum present.  LUNGS:  Clear to auscultation bilaterally.  ABDOMEN:  Soft, round, nontender, positive bowel sounds x4.     LVAD exit site:  Dressing dry and intact.  No erythema or drainage.  Stabilization device present and accurately applied.  Driveline dressing is being changed daily per sterile technique. EXTREMITIES:  Warm and dry, no cyanosis, clubbing, rash or edema . R AKA . L foot dressing dry and intackt.  NEUROLOGIC:  Alert and oriented x 4. .  No aphasia.  No dysarthria.  Affect flat     Telemetry   NSr 80s personally reviewed.  Labs   Basic Metabolic Panel: Recent Labs  Lab 05/30/19 0426 05/31/19 0500 06/01/19 0555 06/02/19 0340 06/02/19 1731  NA 130* 130* 134* 134* 133*  K 3.7 4.2 3.7 3.7 4.7  CL 93* 94* 95* 96* 95*   CO2 22 20* 25 24 20*  GLUCOSE 148* 101* 131* 192* 245*  BUN 37* 55* 28* 42* 50*  CREATININE 7.39* 9.82* 6.70* 9.72* 10.59*  CALCIUM 8.7* 8.8* 8.8* 8.7* 8.8*  PHOS  --   --   --   --  8.5*    Liver Function Tests: Recent Labs  Lab 06/02/19 1731  ALBUMIN 2.2*   No results for input(s): LIPASE, AMYLASE in the last 168 hours. No results for input(s): AMMONIA in the last 168 hours.  CBC: Recent Labs  Lab 05/28/19 1055  05/30/19 0426 05/31/19 0500 06/01/19 0555 06/02/19 0340 06/02/19 1731  WBC 18.6*   < > 17.9* 20.8* 23.1* 23.2* 27.6*  NEUTROABS 15.5*  --   --   --   --   --   --   HGB 10.2*   < > 8.6* 8.4* 8.7* 8.0* 7.7*  HCT 32.0*   < > 28.9* 28.1* 28.1* 25.5* 25.4*  MCV 87.9   < > 90.6 89.2 87.5 86.4 87.3  PLT 330   < > 284 293 283 294 347   < > = values in this interval not displayed.    INR: Recent Labs  Lab 05/30/19 0426 05/31/19 0500 06/01/19 0555 06/02/19 0340 06/03/19 0412  INR 1.9* 1.8* 1.7* 1.7* 1.7*    Other results:  EKG:  Imaging   No results found.   Medications:     Scheduled Medications: . aspirin EC  81 mg Oral Daily  . atorvastatin  40 mg Oral q1800  . Chlorhexidine Gluconate Cloth  6 each Topical Daily  . Chlorhexidine Gluconate Cloth  6 each Topical Q0600  . Chlorhexidine Gluconate Cloth  6 each Topical Q0600  . citalopram  20 mg Oral Daily  . [START ON 06/05/2019] darbepoetin (ARANESP) injection - DIALYSIS  200 mcg Intravenous Q Wed-HD  . doxercalciferol  2 mcg Intravenous Q M,W,F-HD  . dronabinol  2.5 mg Oral BID AC  . erythromycin  250 mg Oral TID  . fentaNYL   Intravenous Q4H  . gabapentin  300 mg Oral BID  . influenza vac split quadrivalent PF  0.5 mL Intramuscular Tomorrow-1000  . insulin aspart  0-9 Units Subcutaneous TID WC  . insulin aspart  7 Units Subcutaneous Q supper  . insulin glargine  10 Units Subcutaneous Daily  . metoCLOPramide  10 mg Oral TID WC  . midodrine  5 mg Oral Q M,W,F  . multivitamin  1 tablet  Oral QHS  . pantoprazole  40 mg Oral BID AC  . sevelamer carbonate  2,400 mg Oral TID WC    Infusions: . sodium chloride 10 mL/hr at 06/01/19 1200  . sodium chloride 10 mL/hr at 06/02/19 0707  . heparin 1,350 Units/hr (06/02/19 2056)  . meropenem (MERREM) IV Stopped (06/01/19 1045)  . vancomycin Stopped (05/31/19 1758)    PRN Medications: sodium chloride, acetaminophen, alteplase, diphenhydrAMINE **OR** diphenhydrAMINE, docusate sodium, hydrALAZINE, LORazepam, naloxone **AND** sodium chloride flush, ondansetron (ZOFRAN) IV, promethazine, promethazine   Assessment/Plan:    1. Ischemic feet/ left great toe gangrene/PAD: Now s/p left great toe amputation by Dr Trula Slade.  Also still with considerable right foot pain and unable to bear weight.  - s/p R AKA yesterday with Dr Donnetta Hutching - Now on meropenem/vanc.  - Afebrile. CBC pending.  - 9/24 BCX - no growth - Concerned that he may need further amputation on left  2. Chronic systolic CHF: Ischemic cardiomyopathy. St Jude ICD. NSTEMI in March 2018 with DES to LAD and RCA, complicated by cardiogenic shock, low output requiring milrinone. Echo 8/18 with EF 20-25%, moderate MR. CPX (8/18) with severe functional impairment due to HF. He is s/p HM3 LVAD placement. He had pump thrombosis 6/19 due to [facility] outflow graft kinking. He had pump exchange for another HM3. Not a candidate for transplant with PAD. Volume status managed at HD.  - Continue ASA 81. Off coumadin for surgery.  - INR.1.7. On heparin drip.  -LDH 158  3. ESRD: HD on M-W-F with midodrine pre-HD. Had HD today.    4.CAD: NSTEMI in 3/18. LHC with 99% ulcerated lesion proximal RCA with left to right collaterals, 95% mid LAD stenosis after mid LAD stent. s/p PCI to RCA and LAD on 11/25/16.No s/s of ischemia - Continue statin and ASA. 5. h/o DVTs: Factor V Leiden heterozygote. -Anticoagulated. 6. Diabetic gastroparesis: He has seen gastroparesis specialist at [facility].  Surprisingly, gastric emptying study was normal. However, electrogastrogram showed poor gastric accomodation/capacity. Therefore, small frequent meals recommended. - Continue reglan 10 mg tid/ac and Marinol.  - Continue Protonix bid.  - He will continue erythromycinindefinitely.  - Limit narcotic use as much as possible. 7. HTN: Maps over the weekend running higher. He had 2 doses of IV hydralazine Sat and Sunday.  - Tomorrow if Maps running high will add 12.5 hydralazine on non  HD days.  8. Pain: Considerable right foot pain as above and unable to bear weight.  He is on pain meds - hopefully should improve with AKA but may get worse immediately post-op 9. ID: WBCs up, suspect related to ischemic extremities.  - Abx expanded on 9/26 to included meropenem.  - Afebrile today.  - CBC pending.   Length of Stay: [place], NP 06/03/2019, 7:37 AM  VAD Team --- VAD ISSUES ONLY--- Pager [phone] (7am - 7am)  Advanced Heart Failure Team  Pager [phone] (M-F; 7a - 4p)  Please contact [facility] for night-coverage after hours (4p -7a ) and weekends on [email or web address]  Patient seen with NP, agree with the above note.   Now s/p right AKA. Complains of pain right surgical site today along with nausea.   General: NAD.  HEENT: Normal. Neck: Supple, JVP 7-8 cm. Carotids OK.  Cardiac:  Mechanical heart sounds with LVAD hum present.  Lungs:  CTAB, normal effort.  Abdomen:  NT, ND, no HSM. No bruits or masses. +BS  LVAD exit site: Well-healed and incorporated. Dressing dry and intact. No erythema or drainage. Stabilization device present and accurately applied. Driveline dressing changed daily per sterile technique. Extremities:  S/p R AKA and left 1st great toe amputation.  Neuro:  Alert & oriented x 3. Cranial nerves grossly intact. Moves all 4 extremities w/o difficulty. Affect pleasant    Afebrile today, continue vancomycin/meropenem for now.  Pending CBCs, hopefully WBCs to start to  fall post-AKA. Cultures NGTD.   Heparin gtt while INR subtherapeutic.  Restart warfarin when we know he will not need further procedures (?further amputation on left).   Has nausea today, likely flare of gastroparesis, to get IV Phenergan.   Needs to start PT.  Need to start discharge planning in terms of SNF, this is going to be difficult.   Jael Kostick 06/03/2019 8:40 AM

## 2019-06-04 LAB — CBC
HCT: 24.8 % — ABNORMAL LOW (ref 39.0–52.0)
Hemoglobin: 7.9 g/dL — ABNORMAL LOW (ref 13.0–17.0)
MCH: 27.1 pg (ref 26.0–34.0)
MCHC: 31.9 g/dL (ref 30.0–36.0)
MCV: 84.9 fL (ref 80.0–100.0)
Platelets: 446 10*3/uL — ABNORMAL HIGH (ref 150–400)
RBC: 2.92 MIL/uL — ABNORMAL LOW (ref 4.22–5.81)
RDW: 17.8 % — ABNORMAL HIGH (ref 11.5–15.5)
WBC: 34.8 10*3/uL — ABNORMAL HIGH (ref 4.0–10.5)
nRBC: 0.1 % (ref 0.0–0.2)

## 2019-06-04 LAB — GLUCOSE, CAPILLARY
Glucose-Capillary: 152 mg/dL — ABNORMAL HIGH (ref 70–99)
Glucose-Capillary: 169 mg/dL — ABNORMAL HIGH (ref 70–99)
Glucose-Capillary: 257 mg/dL — ABNORMAL HIGH (ref 70–99)
Glucose-Capillary: 109 mg/dL — ABNORMAL HIGH (ref 70–99)
Glucose-Capillary: 43 mg/dL — CL (ref 70–99)

## 2019-06-04 LAB — CULTURE, BLOOD (ROUTINE X 2)
Culture: NO GROWTH
Culture: NO GROWTH
Special Requests: ADEQUATE

## 2019-06-04 LAB — PROTIME-INR
INR: 2 — ABNORMAL HIGH (ref 0.8–1.2)
Prothrombin Time: 22.7 seconds — ABNORMAL HIGH (ref 11.4–15.2)

## 2019-06-04 LAB — LACTATE DEHYDROGENASE: LDH: 164 U/L (ref 98–192)

## 2019-06-04 LAB — HEPARIN LEVEL (UNFRACTIONATED): Heparin Unfractionated: 0.54 IU/mL (ref 0.30–0.70)

## 2019-06-04 LAB — SURGICAL PATHOLOGY

## 2019-06-04 MED ORDER — LORAZEPAM 2 MG/ML IJ SOLN
0.5000 mg | Freq: Three times a day (TID) | INTRAMUSCULAR | Status: DC | PRN
  Administered 2019-06-04 – 2019-06-08 (×6): 0.5 mg via INTRAVENOUS
  Filled 2019-06-04 (×6): qty 1

## 2019-06-04 MED ORDER — WARFARIN - PHARMACIST DOSING INPATIENT
Freq: Every day | Status: DC
  Administered 2019-06-09 – 2019-06-11 (×2)
  Administered 2019-06-13: 1

## 2019-06-04 MED ORDER — WARFARIN SODIUM 2.5 MG PO TABS: 2.5000 mg | ORAL_TABLET | Freq: Once | ORAL | Status: AC

## 2019-06-04 MED ORDER — HYDRALAZINE HCL 25 MG PO TABS
25.0000 mg | ORAL_TABLET | ORAL | Status: DC
  Administered 2019-06-04 – 2019-06-16 (×19): 25 mg via ORAL
  Filled 2019-06-04 (×19): qty 1

## 2019-06-04 MED ORDER — CHLORHEXIDINE GLUCONATE CLOTH 2 % EX PADS
6.0000 | MEDICATED_PAD | Freq: Every day | CUTANEOUS | Status: DC
  Administered 2019-06-05 – 2019-06-16 (×7): 6 via TOPICAL

## 2019-06-04 NOTE — Progress Notes (Signed)
LVAD Coordinator Rounding Note:  Admitted 05/28/19 per Dr. Aundra Dubin for heparin bridge for left toe amputation.    HM III LVAD implanted on 06/12/17 by Dr. Darcey Nora under Destination Therapy criteria. Pump exchange performed on 03/01/18 per Dr. Darcey Nora for pump thrombus.   Patient laying in bed asleep this morning. Has PCA Fentanyl for pain management.   Vital signs: Temp: 98.4 HR: 100 Doppler Pressure: 118 Automatic BP: 127/103 (112) O2 Sat: 98% RA  Wt: 184>186.9>184.5>179.4 lbs   LVAD interrogation reveals:  Speed: 5500 Flow: 4.3 Power: 4.5w PI: 3.4 Alarms: none Events:  rare Hematocrit: 23  Fixed speed: 5500 Low speed limit: 5200   Drive Line: Dressing C/D/I; anchor intact and accurately applied. Weekly dressing changes per bedside nurse; next dressing change due 06/09/19.  Labs:  LDH trend: 200>186>156>159>174>155>158>164  INR trend: 2.5>2.3>1.9>1.8>1.7>1.7>1.7>2.0  Anticoagulation Plan: -INR Goal: 2.0 - 3.0 -ASA Dose: 81 mg daily   Device: St Jude dual ICD - Therapies: on 200 bpm - Last check: 01/01/18 - 2 hrs Afl/Afib; EMI 04/02/18   Infection:  - BC x 2 on 05/30/19>>no growth x 5 days  Plan/Recommendations:  1. Call VAD pager if any questions about VAD equipment or drive line.   [name] RN [place] Coordinator  Office: [phone]  24/7 Pager: [phone]

## 2019-06-04 NOTE — TOC Progression Note (Addendum)
Transition of Care [facility]) - Progression Note    Patient Details  Name: [name] MRN: [id number] Date of Birth: [birth year]  [facility] CM/SW Contact  Zenon Mayo, RN Phone Number: 06/04/2019, 2:05 PM  Clinical Narrative:    NCM spoke with Amy NP, she would like for NCM to check into LTACH with [facility] for patient, NCM contacted Lauren with [facility], she states patient is appropriate for [facility] but they do not have any HD beds. She will keep a check on the bed status and let NCM know when they have a HD bed.  10/1- still no HD bed at [facility] yet.  Lauren will update NCM daily.   Expected Discharge Plan: [place] Barriers to Discharge: No SNF bed, Continued Medical Work up(still in search of LVAD and Dialysis patient SNF bed)  Expected Discharge Plan and Services Expected Discharge Plan: [place] Choice: Newborn arrangements for the past 2 months: Single Family Home                                       Social Determinants of Health (SDOH) Interventions    Readmission Risk Interventions Readmission Risk Prevention Plan 03/20/2019  Transportation Screening Complete  Medication Review Press photographer) Complete  HRI or [place] Complete  SW Recovery Care/Counseling Consult Complete  [place] Not Applicable  Some recent data might be hidden

## 2019-06-04 NOTE — Progress Notes (Signed)
[name] NEPHROLOGY PROGRESS NOTE  Assessment/ Plan: Pt is a 53 y.o. yo male ESRD on HD, CAD, severe ICM status post LVAD, DM, A. fib admitted for amputation of left great toe.   Physical Exam: General:NAD, comfortable Heart: Mechanical sound of LVAD device Lungs:clear b/l, no crackle Abdomen:soft, Non-tender, non-distended Extremities:No edema, dressing applied to the amputation site Dialysis Access: Left IJ [facility]  Dialysis:[facility] MWF  4h 20mn     400/800   84kg   2K/3Ca   Hep none   TDC Venofer 1084mIV x 5 until 9/28 Mircera 200 mcg IV q 2 weeks (last 9/16)  Hectorol 91m71mIV TIW   Assessment/ Plan #  PAD bilateral LE's: SP L great toe amp on 9/23.  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    # ESRD -HD MWF.  HD tomorrow # Hypertension/volume - No volume excess on exam. Wt's down 2.5kg which is close to expected post- AKA.   # Anemia -drop in hemoglobin likely due to blood loss during surgery . On ESA as outpatient, last Mircera on 9/16. Next esa ordered for 9/30 here.   # Metabolic bone disease -Continue VDRA/binders.    # CAD/ICM s/p LVAD - per HF team   RobKelly SplinterD 06/04/2019, 12:22 PM      Subjective:  Seen at HD, no new c/o  Objective Vital signs in last 24 hours: Vitals:   06/04/19 0440 06/04/19 0752 06/04/19 0819 06/04/19 0958  BP:  (!) 127/103    Pulse:   99   Resp:   20 20  Temp:   98.4 F (36.9 C)   TempSrc:   Oral   SpO2:   98% 99%  Weight: 81.4 kg     Height:       Weight change: 0 kg  Intake/Output Summary (Last 24 hours) at 06/04/2019 1222 Last data filed at 06/04/2019 0400 Gross per 24 hour  Intake 1167.62 ml  Output 1500 ml  Net -332.38 ml       Labs: Basic Metabolic Panel: Recent Labs  Lab 06/02/19 0340 06/02/19 1731 06/03/19 0800  NA 134* 133* 136  K 3.7 4.7 3.7  CL 96* 95* 105  CO2 24 20* 14*  GLUCOSE 192* 245* 278*  BUN 42* 50* 53*  CREATININE 9.72* 10.59* 10.10*  CALCIUM 8.7* 8.8* 7.2*   PHOS  --  8.5*  --    Liver Function Tests: Recent Labs  Lab 06/02/19 1731  ALBUMIN 2.2*   No results for input(s): LIPASE, AMYLASE in the last 168 hours. No results for input(s): AMMONIA in the last 168 hours. CBC: Recent Labs  Lab 06/01/19 0555 06/02/19 0340 06/02/19 1731 06/03/19 0800 06/04/19 0430  WBC 23.1* 23.2* 27.6* 32.5* 34.8*  HGB 8.7* 8.0* 7.7* 7.1* 7.9*  HCT 28.1* 25.5* 25.4* 23.4* 24.8*  MCV 87.5 86.4 87.3 86.0 84.9  PLT 283 294 347 366 446*   Cardiac Enzymes: No results for input(s): CKTOTAL, CKMB, CKMBINDEX, TROPONINI in the last 168 hours. CBG: Recent Labs  Lab 06/03/19 1326 06/03/19 1614 06/03/19 2123 06/04/19 0606 06/04/19 1133  GLUCAP 162* 194* 138* 152* 169*    Iron Studies: No results for input(s): IRON, TIBC, TRANSFERRIN, FERRITIN in the last 72 hours. Studies/Results: No results found.  Medications: Infusions: . sodium chloride 10 mL/hr at 06/01/19 1200  . sodium chloride 10 mL/hr at 06/02/19 0707  . heparin 1,350 Units/hr (06/04/19 1215)  . meropenem (MERREM) IV 500 mg (06/04/19 1035)  . vancomycin  Stopped (06/03/19 1330)    Scheduled Medications: . aspirin EC  81 mg Oral Daily  . atorvastatin  40 mg Oral q1800  . Chlorhexidine Gluconate Cloth  6 each Topical Daily  . Chlorhexidine Gluconate Cloth  6 each Topical Q0600  . Chlorhexidine Gluconate Cloth  6 each Topical Q0600  . citalopram  20 mg Oral Daily  . [START ON 06/05/2019] darbepoetin (ARANESP) injection - DIALYSIS  200 mcg Intravenous Q Wed-HD  . doxercalciferol  2 mcg Intravenous Q M,W,F-HD  . dronabinol  2.5 mg Oral BID AC  . erythromycin  250 mg Oral TID  . fentaNYL   Intravenous Q4H  . gabapentin  300 mg Oral BID  . hydrALAZINE  25 mg Oral 3 times per day on Sun Tue Thu Sat  . influenza vac split quadrivalent PF  0.5 mL Intramuscular Tomorrow-1000  . insulin aspart  0-9 Units Subcutaneous TID WC  . insulin aspart  7 Units Subcutaneous Q supper  . insulin glargine   10 Units Subcutaneous Daily  . metoCLOPramide  10 mg Oral TID WC  . midodrine  5 mg Oral Q M,W,F  . multivitamin  1 tablet Oral QHS  . pantoprazole  40 mg Oral BID AC  . sevelamer carbonate  2,400 mg Oral TID WC  . warfarin  2.5 mg Oral ONCE-1800  . Warfarin - Pharmacist Dosing Inpatient   Does not apply q1800    have reviewed scheduled and prn medications.

## 2019-06-04 NOTE — Progress Notes (Addendum)
  Progress Note    06/04/2019 8:21 AM 2 Days Post-Op  Subjective:  Says his pain pump is working.  RLE pain is different but better than before surgery.  Tm 99.4 now afebrile  Vitals:   06/04/19 0752 06/04/19 0819  BP: (!) 127/103   Pulse:  99  Resp:  20  Temp:  98.4 F (36.9 C)  SpO2:  98%    Physical Exam: general:  No distress Lungs:  Non labored Incisions:  Right AKA incision with some bloody drainage medially but otherwise looks good with staples in tact.  Left great toe amp incision with some duskiness around incision Extremities:  + left peroneal doppler signal    CBC    Component Value Date/Time   WBC 34.8 (H) 06/04/2019 0430   RBC 2.92 (L) 06/04/2019 0430   HGB 7.9 (L) 06/04/2019 0430   HGB 11.7 (L) 09/07/2016 1457   HCT 24.8 (L) 06/04/2019 0430   HCT 37.5 09/07/2016 1457   PLT 446 (H) 06/04/2019 0430   PLT 279 09/07/2016 1457   MCV 84.9 06/04/2019 0430   MCV 81 09/07/2016 1457   MCH 27.1 06/04/2019 0430   MCHC 31.9 06/04/2019 0430   RDW 17.8 (H) 06/04/2019 0430   RDW 14.9 09/07/2016 1457   LYMPHSABS 1.0 05/28/2019 1055   LYMPHSABS 1.3 09/07/2016 1457   MONOABS 1.5 (H) 05/28/2019 1055   EOSABS 0.4 05/28/2019 1055   EOSABS 0.2 09/07/2016 1457   BASOSABS 0.1 05/28/2019 1055   BASOSABS 0.0 09/07/2016 1457    BMET    Component Value Date/Time   NA 136 06/03/2019 0800   NA 144 05/11/2017 0938   K 3.7 06/03/2019 0800   CL 105 06/03/2019 0800   CO2 14 (L) 06/03/2019 0800   GLUCOSE 278 (H) 06/03/2019 0800   BUN 53 (H) 06/03/2019 0800   BUN 68 (H) 05/11/2017 0938   CREATININE 10.10 (H) 06/03/2019 0800   CREATININE 1.63 (H) 11/09/2016 1505   CALCIUM 7.2 (L) 06/03/2019 0800   CALCIUM 8.7 06/27/2017 1152   GFRNONAA 5 (L) 06/03/2019 0800   GFRNONAA 48 (L) 11/09/2016 1505   GFRAA 6 (L) 06/03/2019 0800   GFRAA 55 (L) 11/09/2016 1505    INR    Component Value Date/Time   INR 2.0 (H) 06/04/2019 0430     Intake/Output Summary (Last 24 hours) at  06/04/2019 9983 Last data filed at 06/04/2019 0400 Gross per 24 hour  Intake 1167.62 ml  Output 1500 ml  Net -332.38 ml     Assessment:  53 y.o. male is s/p:  S/p right AKA and left great toe amputation  Plan: -some bloody ooze from right AKA incision medially, but not unexpected given heparin.  Incision otherwise looks good with staples in tact. -left great toe amp is dusky despite peroneal doppler signal.  At risk for more proximal amputation on the left.     Leontine Locket, PA-C [facility] [phone] 06/04/2019 8:21 AM  I have independently interviewed and examined patient and agree with PA assessment and plan above.   Sidda Humm C. Donzetta Matters, MD [facility] [place] Office: [phone] Pager: [phone]

## 2019-06-04 NOTE — Progress Notes (Signed)
Patient ID: [name], male   DOB: 16-Sep-1965, 53 y.o.   MRN: [id number]   Advanced Heart Failure VAD Team Note  PCP-Cardiologist: No primary care provider on file.   Subjective:    9/27 S/P R AKA   He has nausea and pain at right thigh surgical site. WBCs remain high at 35, afebrile.  He remains on vancomycin/meropenem.   LVAD INTERROGATION:  HeartMate 3 LVAD:   Flow 4.2 liters/min, speed 5500, power 4.4 PI 3.5  3 PI events. VAD interrogated personally. Parameters stable.   Objective:    Vital Signs:   Temp:  [98.2 F (36.8 C)-99.4 F (37.4 C)] 98.4 F (36.9 C) (09/29 0819) Pulse Rate:  [82-105] 99 (09/29 0819) Resp:  [13-91] 20 (09/29 0819) BP: (95-160)/(70-108) 127/103 (09/29 0752) SpO2:  [95 %-100 %] 98 % (09/29 0819) Weight:  [81.4 kg-83.7 kg] 81.4 kg (09/29 0440) Last BM Date: 05/29/19 Mean arterial Pressure 80s-90s   Intake/Output:   Intake/Output Summary (Last 24 hours) at 06/04/2019 0825 Last data filed at 06/04/2019 0400 Gross per 24 hour  Intake 1167.62 ml  Output 1500 ml  Net -332.38 ml     Physical Exam   Physical Exam: General: Well appearing this am. NAD.  HEENT: Normal. Neck: Supple, JVP 7-8 cm. Carotids OK.  Cardiac:  Mechanical heart sounds with LVAD hum present.  Lungs:  CTAB, normal effort.  Abdomen:  NT, ND, no HSM. No bruits or masses. +BS  LVAD exit site: Well-healed and incorporated. Dressing dry and intact. No erythema or drainage. Stabilization device present and accurately applied. Driveline dressing changed daily per sterile technique. Extremities:  S/p right AKA and left great toe amputation.  Neuro:  Alert & oriented x 3. Cranial nerves grossly intact. Moves all 4 extremities w/o difficulty. Affect pleasant     Telemetry   NSR 80s personally reviewed.   Labs   Basic Metabolic Panel: Recent Labs  Lab 05/31/19 0500 06/01/19 0555 06/02/19 0340 06/02/19 1731 06/03/19 0800  NA 130* 134* 134* 133* 136  K 4.2 3.7 3.7  4.7 3.7  CL 94* 95* 96* 95* 105  CO2 20* 25 24 20* 14*  GLUCOSE 101* 131* 192* 245* 278*  BUN 55* 28* 42* 50* 53*  CREATININE 9.82* 6.70* 9.72* 10.59* 10.10*  CALCIUM 8.8* 8.8* 8.7* 8.8* 7.2*  PHOS  --   --   --  8.5*  --     Liver Function Tests: Recent Labs  Lab 06/02/19 1731  ALBUMIN 2.2*   No results for input(s): LIPASE, AMYLASE in the last 168 hours. No results for input(s): AMMONIA in the last 168 hours.  CBC: Recent Labs  Lab 05/28/19 1055  06/01/19 0555 06/02/19 0340 06/02/19 1731 06/03/19 0800 06/04/19 0430  WBC 18.6*   < > 23.1* 23.2* 27.6* 32.5* 34.8*  NEUTROABS 15.5*  --   --   --   --   --   --   HGB 10.2*   < > 8.7* 8.0* 7.7* 7.1* 7.9*  HCT 32.0*   < > 28.1* 25.5* 25.4* 23.4* 24.8*  MCV 87.9   < > 87.5 86.4 87.3 86.0 84.9  PLT 330   < > 283 294 347 366 446*   < > = values in this interval not displayed.    INR: Recent Labs  Lab 05/31/19 0500 06/01/19 0555 06/02/19 0340 06/03/19 0412 06/04/19 0430  INR 1.8* 1.7* 1.7* 1.7* 2.0*    Other results:  EKG:    Imaging  No results found.   Medications:     Scheduled Medications: . aspirin EC  81 mg Oral Daily  . atorvastatin  40 mg Oral q1800  . Chlorhexidine Gluconate Cloth  6 each Topical Daily  . Chlorhexidine Gluconate Cloth  6 each Topical Q0600  . Chlorhexidine Gluconate Cloth  6 each Topical Q0600  . citalopram  20 mg Oral Daily  . [START ON 06/05/2019] darbepoetin (ARANESP) injection - DIALYSIS  200 mcg Intravenous Q Wed-HD  . doxercalciferol  2 mcg Intravenous Q M,W,F-HD  . dronabinol  2.5 mg Oral BID AC  . erythromycin  250 mg Oral TID  . fentaNYL   Intravenous Q4H  . gabapentin  300 mg Oral BID  . influenza vac split quadrivalent PF  0.5 mL Intramuscular Tomorrow-1000  . insulin aspart  0-9 Units Subcutaneous TID WC  . insulin aspart  7 Units Subcutaneous Q supper  . insulin glargine  10 Units Subcutaneous Daily  . metoCLOPramide  10 mg Oral TID WC  . midodrine  5 mg Oral  Q M,W,F  . multivitamin  1 tablet Oral QHS  . pantoprazole  40 mg Oral BID AC  . sevelamer carbonate  2,400 mg Oral TID WC    Infusions: . sodium chloride 10 mL/hr at 06/01/19 1200  . sodium chloride 10 mL/hr at 06/02/19 0707  . heparin 1,350 Units/hr (06/04/19 0752)  . meropenem (MERREM) IV Stopped (06/03/19 1414)  . vancomycin Stopped (06/03/19 1330)    PRN Medications: sodium chloride, acetaminophen, diphenhydrAMINE **OR** diphenhydrAMINE, docusate sodium, hydrALAZINE, LORazepam, LORazepam, naloxone **AND** sodium chloride flush, ondansetron (ZOFRAN) IV, promethazine, promethazine   Assessment/Plan:    1. 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  Also still with considerable right thigh surgical site pain, still using fentanyl PCA.   - Concerned that he may need further amputation on left, but no current plans.   2. Chronic systolic CHF: Ischemic cardiomyopathy. St Jude ICD. NSTEMI in March 2018 with DES to LAD and RCA, complicated by cardiogenic shock, low output requiring milrinone. Echo 8/18 with EF 20-25%, moderate MR. CPX (8/18) with severe functional impairment due to HF. He is s/p HM3 LVAD placement. He had pump thrombosis 6/19 due to [facility] outflow graft kinking. He had pump exchange for another HM3. Not a candidate for transplant with PAD. Volume status managed at HD. LDH 164.  - Continue ASA 81.   - INR 2, on heparin gtt.  Discussed with Dr. Donzetta Matters, can restart warfarin.   3. ESRD: HD on M-W-F with midodrine pre-HD.   4.CAD: NSTEMI in 3/18. LHC with 99% ulcerated lesion proximal RCA with left to right collaterals, 95% mid LAD stenosis after mid LAD stent. s/p PCI to RCA and LAD on 11/25/16.No chest pain.  - Continue statin and ASA. 5. h/o DVTs: Factor V Leiden heterozygote. -Anticoagulated. 6. Diabetic gastroparesis: Long history of this.  Increased nausea post-op, may be related to increased narcotic use.  -  Continue reglan 10 mg tid/ac and Marinol.  - Continue Protonix bid.  - He will continue erythromycinindefinitely.  - He will get low dose IV Ativan to help with nausea.  - Limit narcotic use as much as possible. 7. HTN: Restart hydralazine 25 mg tid on non-HD days.  8. Pain: Still with significant pain right thigh surgical site and using PCA.  9. ID: WBCs up, suspect related to ischemic extremities. Abx expanded on 9/26 to vancomycin/meropenem.  Afebrile. Blood cultures negative.  10. Continue to work with PT.  Working on placement for when he is ready for discharge.   Length of Stay: [place], MD 06/04/2019, 8:25 AM  VAD Team --- VAD ISSUES ONLY--- Pager [phone] (7am - 7am)  Advanced Heart Failure Team  Pager [phone] (M-F; 7a - 4p)  Please contact [facility] for night-coverage after hours (4p -7a ) and weekends on [email or web address]

## 2019-06-04 NOTE — Progress Notes (Signed)
ANTICOAGULATION CONSULT NOTE - Follow Up Consult  Pharmacy Consult for Heparin, Resume warfarin today Indication: LVAD HM3  Allergies  Allergen Reactions  . Metformin And Related Diarrhea    Patient Measurements: Height: 5' 7.99" (172.7 cm) Weight: 179 lb 7.3 oz (81.4 kg) IBW/kg (Calculated) : 68.38   Vital Signs: Temp: 98.4 F (36.9 C) (09/29 0819) Temp Source: Oral (09/29 0819) BP: 127/103 (09/29 0752) Pulse Rate: 99 (09/29 0819)  Labs: Recent Labs    06/02/19 0340 06/02/19 1731 06/03/19 0412 06/03/19 0800 06/04/19 0430 06/04/19 0431  HGB 8.0* 7.7*  --  7.1* 7.9*  --   HCT 25.5* 25.4*  --  23.4* 24.8*  --   PLT 294 347  --  366 446*  --   LABPROT 20.0*  --  19.9*  --  22.7*  --   INR 1.7*  --  1.7*  --  2.0*  --   HEPARINUNFRC 0.32  --  0.46  --   --  0.54  CREATININE 9.72* 10.59*  --  10.10*  --   --     Estimated Creatinine Clearance: 8.1 mL/min (A) (by C-G formula based on SCr of 10.1 mg/dL (H)).   Medical History: Past Medical History:  Diagnosis Date  . Angina   . ASCVD (arteriosclerotic cardiovascular disease)    , Anterior infarction 2005, LAD diagonal bifurcation intervention 03/2004  . Automatic implantable cardiac defibrillator -St. Jude's       . Benign neoplasm of colon   . CHF (congestive heart failure) ([place])   . Chronic systolic heart failure ([place])   . Coronary artery disease     Widely patent previously placed stents in the left anterior   . Crohn's disease ([place])   . Deep venous thrombosis ([facility])    Recurrent-on Coumadin  . Dialysis patient ([place])   . Dyspnea   . Gastroparesis   . GERD (gastroesophageal reflux disease)   . High cholesterol   . Hyperlipidemia   . Hypersomnolent    Previous diagnosis of narcolepsy  . Hypertension, essential   . Ischemic cardiomyopathy    Ejection fraction 15-20% catheterization 2010  . Type II or unspecified type diabetes mellitus without mention of complication, not stated as uncontrolled   .  Unspecified gastritis and gastroduodenitis without mention of hemorrhage     Assessment: 65 yoM on warfarin PTA for LVAD and hx FV Leiden admitted for ongoing foot ischemia. 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  May need further intervention (L AKA) by VVS, but no immediate plans, so pharmacy asked to resume Coumadin.  INR elevated today at 2, despite no warfarin in over a week.  Patient not eating much.  PTA warfarin 7.43m Tu/Fr 54mQOD  Goal of Therapy:  INR 2-3  Heparin Level 0.3-0.7 units/mL Monitor platelets by anticoagulation protocol: Yes   Plan:  Cont heparin at 1350 units/hr Restart warfarin with 2.5 mg x 1 tonight. Daily heparin level, CBC and INR.  JeMarguerite OleaBCWilmington Gastroenterologylinical Pharmacist Phone ([phone]/29/2020 10:52 AM

## 2019-06-05 LAB — CBC
HCT: 24.2 % — ABNORMAL LOW (ref 39.0–52.0)
Hemoglobin: 7.3 g/dL — ABNORMAL LOW (ref 13.0–17.0)
MCH: 26.2 pg (ref 26.0–34.0)
MCHC: 30.2 g/dL (ref 30.0–36.0)
MCV: 86.7 fL (ref 80.0–100.0)
Platelets: 465 10*3/uL — ABNORMAL HIGH (ref 150–400)
RBC: 2.79 MIL/uL — ABNORMAL LOW (ref 4.22–5.81)
RDW: 18.4 % — ABNORMAL HIGH (ref 11.5–15.5)
WBC: 29 10*3/uL — ABNORMAL HIGH (ref 4.0–10.5)
nRBC: 0.1 % (ref 0.0–0.2)

## 2019-06-05 LAB — VANCOMYCIN, RANDOM: Vancomycin Rm: 23

## 2019-06-05 LAB — RENAL FUNCTION PANEL
Albumin: 1.9 g/dL — ABNORMAL LOW (ref 3.5–5.0)
Anion gap: 14 (ref 5–15)
BUN: 52 mg/dL — ABNORMAL HIGH (ref 6–20)
CO2: 23 mmol/L (ref 22–32)
Calcium: 8.3 mg/dL — ABNORMAL LOW (ref 8.9–10.3)
Chloride: 96 mmol/L — ABNORMAL LOW (ref 98–111)
Creatinine, Ser: 9.58 mg/dL — ABNORMAL HIGH (ref 0.61–1.24)
GFR calc Af Amer: 6 mL/min — ABNORMAL LOW (ref >60)
GFR calc non Af Amer: 6 mL/min — ABNORMAL LOW (ref >60)
Glucose, Bld: 177 mg/dL — ABNORMAL HIGH (ref 70–99)
Phosphorus: 7.8 mg/dL — ABNORMAL HIGH (ref 2.5–4.6)
Potassium: 4.4 mmol/L (ref 3.5–5.1)
Sodium: 133 mmol/L — ABNORMAL LOW (ref 135–145)

## 2019-06-05 LAB — GLUCOSE, CAPILLARY
Glucose-Capillary: 40 mg/dL — CL (ref 70–99)
Glucose-Capillary: 138 mg/dL — ABNORMAL HIGH (ref 70–99)
Glucose-Capillary: 124 mg/dL — ABNORMAL HIGH (ref 70–99)
Glucose-Capillary: 119 mg/dL — ABNORMAL HIGH (ref 70–99)

## 2019-06-05 LAB — PROTIME-INR
INR: 2.1 — ABNORMAL HIGH (ref 0.8–1.2)
Prothrombin Time: 22.9 seconds — ABNORMAL HIGH (ref 11.4–15.2)

## 2019-06-05 LAB — HEPARIN LEVEL (UNFRACTIONATED)
Heparin Unfractionated: 0.49 IU/mL (ref 0.30–0.70)
Heparin Unfractionated: 0.41 IU/mL (ref 0.30–0.70)

## 2019-06-05 LAB — PREPARE RBC (CROSSMATCH)

## 2019-06-05 LAB — LACTATE DEHYDROGENASE: LDH: 162 U/L (ref 98–192)

## 2019-06-05 MED ORDER — WARFARIN SODIUM 2.5 MG PO TABS
2.5000 mg | ORAL_TABLET | Freq: Once | ORAL | Status: AC
  Administered 2019-06-05: 19:00:00 2.5 mg via ORAL
  Filled 2019-06-05: qty 1

## 2019-06-05 MED ORDER — VANCOMYCIN HCL IN DEXTROSE 1-5 GM/200ML-% IV SOLN
INTRAVENOUS | Status: AC
  Administered 2019-06-05: 1000 mg via INTRAVENOUS
  Filled 2019-06-05: qty 200

## 2019-06-05 MED ORDER — DARBEPOETIN ALFA 200 MCG/0.4ML IJ SOSY
PREFILLED_SYRINGE | INTRAMUSCULAR | Status: AC
  Administered 2019-06-05: 200 ug via INTRAVENOUS
  Filled 2019-06-05: qty 0.4

## 2019-06-05 MED ORDER — SODIUM CHLORIDE 0.9% IV SOLUTION: Freq: Once | INTRAVENOUS | Status: DC

## 2019-06-05 MED ORDER — HEPARIN SODIUM (PORCINE) 1000 UNIT/ML IJ SOLN
INTRAMUSCULAR | Status: AC
  Administered 2019-06-05: 3400 [IU]
  Filled 2019-06-05: qty 4

## 2019-06-05 MED ORDER — DOXERCALCIFEROL 4 MCG/2ML IV SOLN
INTRAVENOUS | Status: AC
  Administered 2019-06-05: 2 ug via INTRAVENOUS
  Filled 2019-06-05: qty 2

## 2019-06-05 MED ORDER — SODIUM CHLORIDE 0.9 % IV BOLUS
250.0000 mL | Freq: Once | INTRAVENOUS | Status: AC
  Administered 2019-06-05: 250 mL via INTRAVENOUS

## 2019-06-05 MED ORDER — HEPARIN (PORCINE) 25000 UT/250ML-% IV SOLN
1300.0000 [IU]/h | INTRAVENOUS | Status: DC
  Administered 2019-06-05 – 2019-06-08 (×5): 1300 [IU]/h via INTRAVENOUS
  Filled 2019-06-05 (×5): qty 250

## 2019-06-05 NOTE — Progress Notes (Addendum)
 -*  Dressings off of right BKA and left great toe amp.  Still with some mild bloody ooze from right AKA, which is expected given heparin.  Left great toe amp dusky.  At high risk for more proximal amputation.   Will continue to monitor.  Discussed with pt when he goes to HD, to cover up his wounds.    Leontine Locket, [facility] 06/05/2019 8:33 AM   I have independently interviewed and examined patient and agree with PA assessment and plan above. Amputation site on the right healing well with some ooze, left is demarcating and is tenuous but does not appear to be source of leukocytosis.  Brandon C. Donzetta Matters, MD [facility] [place] Office: [phone] Pager: [phone]

## 2019-06-05 NOTE — Progress Notes (Signed)
Patient ID: [name], male   DOB: 1966/05/15, 53 y.o.   MRN: [id number]   Advanced Heart Failure VAD Team Note  PCP-Cardiologist: No primary care provider on file.   Subjective:    9/27 S/P R AKA   Nausea better, eating now.  Still with pain right thigh surgical site, using fentanyl PCA.  Looks comfortable but pain "9."  WBCs 35 => 29, afebrile.  He remains on vancomycin/meropenem.   Hgb 7.3, still some oozing at right leg amputation site.  On heparin/warfarin, INR 2.1.   MAP 86  LVAD INTERROGATION:  HeartMate 3 LVAD:   Flow 4.4 liters/min, speed 5500, power 4.3 PI 3.0  5 PI events. VAD interrogated personally. Parameters stable.   Objective:    Vital Signs:   Temp:  [98 F (36.7 C)-98.6 F (37 C)] 98.4 F (36.9 C) (09/29 2351) Pulse Rate:  [85-100] 85 (09/29 1959) Resp:  [12-20] 14 (09/30 0615) BP: (70-121)/(59-86) 121/75 (09/30 0741) SpO2:  [95 %-100 %] 97 % (09/30 0615) Weight:  [82.2 kg] 82.2 kg (09/30 0500) Last BM Date: 06/02/01 Mean arterial Pressure 86  Intake/Output:   Intake/Output Summary (Last 24 hours) at 06/05/2019 0757 Last data filed at 06/05/2019 0410 Gross per 24 hour  Intake 1393.46 ml  Output -  Net 1393.46 ml     Physical Exam   Physical Exam: General: Well appearing this am. NAD.  HEENT: Normal. Neck: Supple, JVP 7-8 cm. Carotids OK.  Cardiac:  Mechanical heart sounds with LVAD hum present.  Lungs:  CTAB, normal effort.  Abdomen:  NT, ND, no HSM. No bruits or masses. +BS  LVAD exit site: Well-healed and incorporated. Dressing dry and intact. No erythema or drainage. Stabilization device present and accurately applied. Driveline dressing changed daily per sterile technique. Extremities:  S/p right AKA, left great toe amputation.   Neuro:  Alert & oriented x 3. Cranial nerves grossly intact. Moves all 4 extremities w/o difficulty. Affect pleasant     Telemetry   NSR 80s personally reviewed.   Labs   Basic Metabolic Panel:  Recent Labs  Lab 05/31/19 0500 06/01/19 0555 06/02/19 0340 06/02/19 1731 06/03/19 0800  NA 130* 134* 134* 133* 136  K 4.2 3.7 3.7 4.7 3.7  CL 94* 95* 96* 95* 105  CO2 20* 25 24 20* 14*  GLUCOSE 101* 131* 192* 245* 278*  BUN 55* 28* 42* 50* 53*  CREATININE 9.82* 6.70* 9.72* 10.59* 10.10*  CALCIUM 8.8* 8.8* 8.7* 8.8* 7.2*  PHOS  --   --   --  8.5*  --     Liver Function Tests: Recent Labs  Lab 06/02/19 1731  ALBUMIN 2.2*   No results for input(s): LIPASE, AMYLASE in the last 168 hours. No results for input(s): AMMONIA in the last 168 hours.  CBC: Recent Labs  Lab 06/02/19 0340 06/02/19 1731 06/03/19 0800 06/04/19 0430 06/05/19 0422  WBC 23.2* 27.6* 32.5* 34.8* 29.0*  HGB 8.0* 7.7* 7.1* 7.9* 7.3*  HCT 25.5* 25.4* 23.4* 24.8* 24.2*  MCV 86.4 87.3 86.0 84.9 86.7  PLT 294 347 366 446* 465*    INR: Recent Labs  Lab 06/01/19 0555 06/02/19 0340 06/03/19 0412 06/04/19 0430 06/05/19 0422  INR 1.7* 1.7* 1.7* 2.0* 2.1*    Other results:  EKG:    Imaging   No results found.   Medications:     Scheduled Medications: . aspirin EC  81 mg Oral Daily  . atorvastatin  40 mg Oral q1800  . Chlorhexidine  Gluconate Cloth  6 each Topical Daily  . Chlorhexidine Gluconate Cloth  6 each Topical Q0600  . Chlorhexidine Gluconate Cloth  6 each Topical Q0600  . Chlorhexidine Gluconate Cloth  6 each Topical Q0600  . citalopram  20 mg Oral Daily  . darbepoetin (ARANESP) injection - DIALYSIS  200 mcg Intravenous Q Wed-HD  . doxercalciferol  2 mcg Intravenous Q M,W,F-HD  . dronabinol  2.5 mg Oral BID AC  . erythromycin  250 mg Oral TID  . fentaNYL   Intravenous Q4H  . gabapentin  300 mg Oral BID  . hydrALAZINE  25 mg Oral 3 times per day on Sun Tue Thu Sat  . influenza vac split quadrivalent PF  0.5 mL Intramuscular Tomorrow-1000  . insulin aspart  0-9 Units Subcutaneous TID WC  . insulin aspart  7 Units Subcutaneous Q supper  . insulin glargine  10 Units Subcutaneous  Daily  . metoCLOPramide  10 mg Oral TID WC  . midodrine  5 mg Oral Q M,W,F  . multivitamin  1 tablet Oral QHS  . pantoprazole  40 mg Oral BID AC  . sevelamer carbonate  2,400 mg Oral TID WC  . warfarin  2.5 mg Oral ONCE-1800  . Warfarin - Pharmacist Dosing Inpatient   Does not apply q1800    Infusions: . sodium chloride 10 mL/hr at 06/01/19 1200  . sodium chloride 10 mL/hr at 06/02/19 0707  . meropenem (MERREM) IV Stopped (06/04/19 1105)  . vancomycin Stopped (06/03/19 1330)    PRN Medications: sodium chloride, acetaminophen, diphenhydrAMINE **OR** diphenhydrAMINE, docusate sodium, hydrALAZINE, LORazepam, LORazepam, naloxone **AND** sodium chloride flush, ondansetron (ZOFRAN) IV, promethazine, promethazine   Assessment/Plan:    1. 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  Also still with considerable right thigh surgical site pain, still using fentanyl PCA. Still some oozing at surgical site.  - Stopping heparin, will transfuse 1 unit PRBCs at HD.   - Concerned that he may eventually need further amputation on left, but no current plans.   2. Chronic systolic CHF: Ischemic cardiomyopathy. St Jude ICD. NSTEMI in March 2018 with DES to LAD and RCA, complicated by cardiogenic shock, low output requiring milrinone. Echo 8/18 with EF 20-25%, moderate MR. CPX (8/18) with severe functional impairment due to HF. He is s/p HM3 LVAD placement. He had pump thrombosis 6/19 due to [facility] outflow graft kinking. He had pump exchange for another HM3. Not a candidate for transplant with PAD. Volume status managed at HD.  - Continue ASA 81.   - INR 2.1, can stop heparin gtt.  He is on warfarin.    3. ESRD: HD on M-W-F with midodrine pre-HD.   4.CAD: NSTEMI in 3/18. LHC with 99% ulcerated lesion proximal RCA with left to right collaterals, 95% mid LAD stenosis after mid LAD stent. s/p PCI to RCA and LAD on 11/25/16.No chest pain.  - Continue  statin and ASA. 5. h/o DVTs: Factor V Leiden heterozygote. -Anticoagulated. 6. Diabetic gastroparesis: Long history of this.  Increased nausea post-op, may be related to increased narcotic use.  This is now improved.  - Continue reglan 10 mg tid/ac and Marinol.  - Continue Protonix bid.  - He will continue erythromycinindefinitely.  - He will get low dose IV Ativan to help with nausea if needed.  - Limit narcotic use as much as possible. 7. HTN: Continue hydralazine 25 mg tid on non-HD days.  8. Pain: Still with significant pain  right thigh surgical site and using PCA.  9. ID: WBCs up, suspect related to ischemic extremities. Abx expanded on 9/26 to vancomycin/meropenem.  Afebrile. Blood cultures negative.  10. Anemia: post-op anemia + anemia of renal disease.  Hgb down to 7.3 with oozing at surgical site.  - As above, stop heparin gtt.  - Transfuse 1 unit PRBCs at HD.  11. Continue to work with PT.  Think that he may be able to go to [facility] at discharge.   Length of Stay: [place], [place] 06/05/2019, 7:57 AM  VAD Team --- VAD ISSUES ONLY--- Pager [phone] (7am - 7am)  Advanced Heart Failure Team  Pager [phone] (M-F; 7a - 4p)  Please contact [facility] for night-coverage after hours (4p -7a ) and weekends on [email or web address]

## 2019-06-05 NOTE — Plan of Care (Signed)

## 2019-06-05 NOTE — Progress Notes (Signed)
Occupational Therapy Treatment Patient Details Name: [name] MRN: [id number] DOB: 1966-07-07 Today's Date: 06/05/2019    History of present illness Pt is a 53 y.o. male with hx of LVAD, ESRD, DM, CAD, HLD who is s/p right TMA on 04/30/2019 by Dr. Donzetta Matters for non healing amp site.  He had previously undergone ray amp of right 4th toes on 03/21/2019 by Dr. Donzetta Matters. He was seen 05/21/2019 by Dr. Aundra Dubin and was having difficulty walking due to pain in his right foot and is unable to put any weight on this foot. 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   OT comments  Pt is progressing very well. Able to transfer using lateral scoot with min assist to block L LE from sliding. He demonstrates good sitting balance at EOB while eating and doffing his L darco shoe. Pt reports he has been leaning side to side in sitting to bathe and dress for some time. Having a ramp built [name] pt can progress home is imperative.  Follow Up Recommendations  LTACH vs SNF   Equipment Recommendations  (drop arm commode)    Recommendations for Other Services      Precautions / Restrictions Precautions Precautions: Fall Precaution Comments: LVAD Required Braces or Orthoses: Other Brace Other Brace: Darco shoe-- left foot Restrictions Weight Bearing Restrictions: Yes RLE Weight Bearing: Non weight bearing Other Position/Activity Restrictions:  pt to weight bear on left heel with Darco shoe       Mobility Bed Mobility Overal bed mobility: Needs Assistance Bed Mobility: Sit to Supine;Supine to Sit     Supine to sit: Min assist Sit to supine: Min guard   General bed mobility comments: Assist to initiate movement to EOB. Assist to support trunk back to bed.   Transfers Overall transfer level: Needs assistance   Transfers: Lateral/Scoot Transfers          Lateral/Scoot Transfers: Min guard;From elevated surface;+2 safety/equipment General transfer comment: Pt was able to scoot to  drop arm recliner with min guard assist and then back to bed with min guard assist. Blocked his left foot [name] it didnt slide.  Used pad to assist pt to recliner and back to bed.     Balance Overall balance assessment: Needs assistance Sitting-balance support: No upper extremity supported;Feet supported Sitting balance-[name]: Good Sitting balance - Comments: no LOB with doffing darco shoe, steady at EOB during lunch                                   ADL either performed or assessed with clinical judgement   ADL Overall ADL's : Needs assistance/impaired Eating/Feeding: Independent;Sitting Eating/Feeding Details (indicate cue type and reason): at EOB             Upper Body Dressing : Set up;Sitting   Lower Body Dressing: Minimal assistance;Sitting/lateral leans Lower Body Dressing Details (indicate cue type and reason): removed darco shoe from L foot Toilet Transfer: Minimal assistance Toilet Transfer Details (indicate cue type and reason): lateral scoot transfer to chair, blocked foot            General ADL Comments: pt reports he has been leaning side to side for some time to wash his bottom and to pull up pants     Vision       Perception     Praxis      Cognition Arousal/Alertness: Awake/alert Behavior During  Therapy: WFL for tasks assessed/performed Overall Cognitive Status: Within Functional Limits for tasks assessed Area of Impairment: Following commands;Safety/judgement;Problem solving                       Following Commands: Follows one step commands consistently;Follows multi-step commands inconsistently     Problem Solving: Requires verbal cues;Requires tactile cues General Comments: pt seemingly aware of changes that will be necessary for him now that he has had the amputation        Exercises     Shoulder Instructions       General Comments [name] with SW was in room and discussing options for d/c.  Pt needs a ramp  to go home.  May go to [facility] however after session, pt did very well with transfer today.      Pertinent Vitals/ Pain       Pain Assessment: Faces Faces Pain Scale: Hurts little more Pain Location: right residual limb Pain Descriptors / Indicators: Sore Pain Intervention(s): Repositioned  Home Living                                          Prior Functioning/Environment              Frequency  Min 2X/week        Progress Toward Goals  OT Goals(current goals can now be found in the care plan section)  Progress towards OT goals: Progressing toward goals  Acute Rehab OT Goals Patient Stated Goal: to go home  OT Goal Formulation: With patient Time For Goal Achievement: 06/13/19 Potential to Achieve Goals: Good  Plan Discharge plan needs to be updated    Co-evaluation    PT/OT/SLP Co-Evaluation/Treatment: Yes Reason for Co-Treatment: For patient/therapist safety PT goals addressed during session: Mobility/safety with mobility OT goals addressed during session: ADL's and self-care      AM-PAC OT "6 Clicks" Daily Activity     Outcome Measure   Help from another person eating meals?: None Help from another person taking care of personal grooming?: A Little Help from another person toileting, which includes using toliet, bedpan, or urinal?: A Lot Help from another person bathing (including washing, rinsing, drying)?: A Lot Help from another person to put on and taking off regular upper body clothing?: A Little Help from another person to put on and taking off regular lower body clothing?: A Lot 6 Click Score: 16    End of Session    OT Visit Diagnosis: Muscle weakness (generalized) (M62.81);Pain   Activity Tolerance Patient tolerated treatment well   Patient Left in bed;with call bell/phone within reach   Nurse Communication          Time: 2334-3568 OT Time Calculation (min): 38 min  Charges: OT General Charges $OT Visit: 1  Visit OT Treatments $Self Care/Home Management : 8-22 mins  [name], OTR/L Acute Rehabilitation Services Pager: [phone] Office: [phone]   [name] 06/05/2019, 1:57 PM

## 2019-06-05 NOTE — Progress Notes (Signed)
Pharmacy Antibiotic Note  [name] is a 53 y.o. male admitted on 05/28/2019 with foot ischemia now s/p amputation. Pharmacy has been consulted for vancomycin and meropenem dosing with concern over infection at surgical sites. Pt is ESRD on HD MWF (last HD 9/23). Patient had been receiving Zosyn since 9/24, but with increase in WBC and fever, MD would like to escalate to meropenem.   Pre-HD vancomycin level today = 23 (goal 15-25).  Plan: Continue Vancomycin 1057m IV qHD Continue Meropenem 500 mg IV q24h Monitor cultures, fever, vancomycin levels as needed Length of antibiotic therapy?  Height: 5' 7.99" (172.7 cm) Weight: 179 lb 3.7 oz (81.3 kg) IBW/kg (Calculated) : 68.38  Temp (24hrs), Avg:98.3 F (36.8 C), Min:98 F (36.7 C), Max:98.6 F (37 C)  Recent Labs  Lab 06/01/19 0555 06/02/19 0340 06/02/19 1731 06/03/19 0800 06/04/19 0430 06/05/19 0422 06/05/19 1400 06/05/19 1414  WBC 23.1* 23.2* 27.6* 32.5* 34.8* 29.0*  --   --   CREATININE 6.70* 9.72* 10.59* 10.10*  --   --   --  9.58*  VANCORANDOM  --   --   --   --   --   --  23  --     Estimated Creatinine Clearance: 8.5 mL/min (A) (by C-G formula based on SCr of 9.58 mg/dL (H)).    Allergies  Allergen Reactions  . Metformin And Related Diarrhea    Antimicrobials this admission: Vancomycin 9/24 >> Zosyn 9/24 >> 9/26 Meropenem 9/26 >>  Dose adjustments this admission: none  Microbiology results: 9/24 BCx - ngtd 9/22 MRSA - neg  Thank you for allowing pharmacy to be a part of this patient's care.  MVertis Kelch PharmD PGY2 Cardiology Pharmacy Resident Phone ([phone]/30/2020       3:24 PM  Please check [email or web address] for unit-specific pharmacist phone numbers

## 2019-06-05 NOTE — Progress Notes (Signed)
ANTICOAGULATION CONSULT NOTE - Follow Up Consult  Pharmacy Consult for Heparin, Resume warfarin today Indication: LVAD HM3  Allergies  Allergen Reactions  . Metformin And Related Diarrhea    Patient Measurements: Height: 5' 7.99" (172.7 cm) Weight: 174 lb 2.6 oz (79 kg) IBW/kg (Calculated) : 68.38   Vital Signs: Temp: 99.2 F (37.3 C) (09/30 1949) Temp Source: Oral (09/30 1949) BP: 88/66 (09/30 1949) Pulse Rate: 85 (09/30 1811)  Labs: Recent Labs    06/03/19 0412  06/03/19 0800 06/04/19 0430 06/04/19 0431 06/05/19 0422 06/05/19 1414 06/05/19 1944  HGB  --    < > 7.1* 7.9*  --  7.3*  --   --   HCT  --   --  23.4* 24.8*  --  24.2*  --   --   PLT  --   --  366 446*  --  465*  --   --   LABPROT 19.9*  --   --  22.7*  --  22.9*  --   --   INR 1.7*  --   --  2.0*  --  2.1*  --   --   HEPARINUNFRC 0.46  --   --   --  0.54 0.49  --  0.41  CREATININE  --   --  10.10*  --   --   --  9.58*  --    < > = values in this interval not displayed.    Estimated Creatinine Clearance: 8.5 mL/min (A) (by C-G formula based on SCr of 9.58 mg/dL (H)).   Medical History: Past Medical History:  Diagnosis Date  . Angina   . ASCVD (arteriosclerotic cardiovascular disease)    , Anterior infarction 2005, LAD diagonal bifurcation intervention 03/2004  . Automatic implantable cardiac defibrillator -St. Jude's       . Benign neoplasm of colon   . CHF (congestive heart failure) ([place])   . Chronic systolic heart failure ([place])   . Coronary artery disease     Widely patent previously placed stents in the left anterior   . Crohn's disease ([place])   . Deep venous thrombosis ([facility])    Recurrent-on Coumadin  . Dialysis patient ([place])   . Dyspnea   . Gastroparesis   . GERD (gastroesophageal reflux disease)   . High cholesterol   . Hyperlipidemia   . Hypersomnolent    Previous diagnosis of narcolepsy  . Hypertension, essential   . Ischemic cardiomyopathy    Ejection fraction 15-20%  catheterization 2010  . Type II or unspecified type diabetes mellitus without mention of complication, not stated as uncontrolled   . Unspecified gastritis and gastroduodenitis without mention of hemorrhage     Assessment: 16 yoM on warfarin PTA for LVAD and hx FV Leiden admitted for ongoing foot ischemia. 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  May need further intervention (L AKA) by [facility]VS, but no immediate plans, so pharmacy asked to resume Coumadin.  INR elevated today at 2.1, despite no warfarin in over a week.  Patient not eating much.  Did not receive dose of warfarin as ordered last night because he was too sleepy for safe po intake per RN.  Also with some stump site oozing/bleeding.  PTA warfarin 7.61m Tu/Fr 536mQOD  PM update: HL 0.41 within range. Continue current management  Goal of Therapy:  INR 2-3  Heparin Level 0.3-0.5 Monitor platelets by anticoagulation  protocol: Yes   Plan:  Cont heparin at 1300 units/hr.  Will target lower heparin level goal given elevated INR and stump bleeding. Daily heparin level, CBC and INR.  [name] A. [name], PharmD, BCPS, FNKF Clinical Pharmacist [place] Please utilize Amion for appropriate phone number to reach the unit pharmacist ([place])    06/05/2019 8:30 PM

## 2019-06-05 NOTE — Progress Notes (Addendum)
Physical Therapy Treatment Patient Details Name: [name] MRN: [id number] DOB: 1965-11-07 Today's Date: 06/05/2019    History of Present Illness Pt is a 53 y.o. male with hx of LVAD, ESRD, DM, CAD, HLD who is s/p right TMA on 04/30/2019 by Dr. Donzetta Matters for non healing amp site.  He had previously undergone ray amp of right 4th toes on 03/21/2019 by Dr. Donzetta Matters. He was seen 05/21/2019 by Dr. Aundra Dubin and was having difficulty walking due to pain in his right foot and is unable to put any weight on this foot. 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    PT Comments    Pt admitted with above diagnosis. Pt was able to transfer with min guard assist both directions to recliner and then back to bed.  Pt did really well.  Pt, OT and this PT discussed that pt should progress well and be able to go home once the ramp is built.  SW, Kennyth Lose came in the room when PT there and discussed all options.  [facility] is plan currently but if pt could get ramp built quickly, pt may be able to go on home with son since he is there all the time.  Called daughter to update her about ramp.  She states she will let staff know in the am what she has worked out. Will continue to progress pt as able.  Revised goal today as pt met bed to chair goal.    Pt currently with functional limitations due to the deficits listed below (see PT Problem List). Pt will benefit from skilled PT to increase their independence and safety with mobility to allow discharge to the venue listed below.    Follow Up Recommendations  SNF;Supervision/Assistance - 24 hour     Equipment Recommendations  None recommended by PT    Recommendations for Other Services Rehab consult     Precautions / Restrictions Precautions Precautions: Fall;Other (comment) Precaution Comments: LVAD Required Braces or Orthoses: Other Brace Other Brace: Darco shoe-- left foot Restrictions Weight Bearing Restrictions: Yes RLE Weight Bearing: Non  weight bearing Other Position/Activity Restrictions:  pt to weight bear on left heel with Darco shoe    Mobility  Bed Mobility Overal bed mobility: Needs Assistance Bed Mobility: Sit to Supine;Supine to Sit     Supine to sit: Min assist Sit to supine: Min guard   General bed mobility comments: Assist to initiate movement to EOB. Assist to support trunk back to bed.   Transfers Overall transfer level: Needs assistance   Transfers: Lateral/Scoot Transfers          Lateral/Scoot Transfers: Min guard;From elevated surface;+2 safety/equipment General transfer comment: Pt was able to scoot to drop arm recliner with min guard assist and then back to bed with min guard assist. Blocked his left foot so it didnt slide.  Used pad to assist pt to recliner and back to bed.   Ambulation/Gait                 Stairs             Wheelchair Mobility    Modified Rankin (Stroke Patients Only)       Balance Overall balance assessment: Needs assistance Sitting-balance support: No upper extremity supported;Feet supported Sitting balance-[name]: Fair Sitting balance - Comments: sat EOb 25 min eating lunch with supervision  Cognition Arousal/Alertness: Awake/alert Behavior During Therapy: Flat affect Overall Cognitive Status: Impaired/Different from baseline Area of Impairment: Following commands;Safety/judgement;Problem solving                       Following Commands: Follows one step commands consistently;Follows multi-step commands inconsistently     Problem Solving: Requires verbal cues;Requires tactile cues        Exercises      General Comments General comments (skin integrity, edema, etc.): Kennyth Lose with SW was in room and discussing options for d/c.  Pt needs a ramp to go home.  May go to [facility] however after session, pt did very well with transfer today.        Pertinent Vitals/Pain Pain  Assessment: Faces Faces Pain Scale: Hurts little more Pain Location: right residual limb Pain Descriptors / Indicators: Sore Pain Intervention(s): Limited activity within patient's tolerance;Monitored during session;Repositioned    Home Living                      Prior Function            PT Goals (current goals can now be found in the care plan section) Acute Rehab PT Goals Patient Stated Goal: to go home  Progress towards PT goals: Progressing toward goals    Frequency    Min 3X/week      PT Plan Current plan remains appropriate    Co-evaluation PT/OT/SLP Co-Evaluation/Treatment: Yes Reason for Co-Treatment: For patient/therapist safety PT goals addressed during session: Mobility/safety with mobility        AM-PAC PT "6 Clicks" Mobility   Outcome Measure  Help needed turning from your back to your side while in a flat bed without using bedrails?: A Little Help needed moving from lying on your back to sitting on the side of a flat bed without using bedrails?: A Little Help needed moving to and from a bed to a chair (including a wheelchair)?: A Little Help needed standing up from a chair using your arms (e.g., wheelchair or bedside chair)?: Total Help needed to walk in hospital room?: Total Help needed climbing 3-5 steps with a railing? : Total 6 Click Score: 12    End of Session Equipment Utilized During Treatment: Gait belt Activity Tolerance: Patient limited by fatigue;Patient limited by pain Patient left: with call bell/phone within reach;in bed;with bed alarm set Nurse Communication: Mobility status PT Visit Diagnosis: Other abnormalities of gait and mobility (R26.89);Pain;Unsteadiness on feet (R26.81) Pain - Right/Left: Right Pain - part of body: Ankle and joints of foot     Time: 1834-3735 PT Time Calculation (min) (ACUTE ONLY): 40 min  Charges:  $Therapeutic Activity: 23-37 mins                     [place] Pager:  [phone]  Office:  [place] 06/05/2019, 12:51 PM

## 2019-06-05 NOTE — Progress Notes (Signed)
LVAD Coordinator Rounding Note:  Admitted 05/28/19 per Dr. Aundra Dubin for heparin bridge for left toe amputation.    HM III LVAD implanted on 06/12/17 by Dr. Darcey Nora under Destination Therapy criteria. Pump exchange performed on 03/01/18 per Dr. Darcey Nora for pump thrombus.   Patient sitting up in bed watching TV. States pain is 6/10 this morning. Has PCA Fentanyl for pain management.   Vital signs: Temp:  HR: 80 Doppler Pressure: 78 Automatic BP: 94/69 (75) O2 Sat: 94% RA  Wt: 184>186.9>184.5>179.4>181.2 lbs   LVAD interrogation reveals:  Speed: 5500 Flow: 4.5 Power: 4.3w PI: 2.9 Alarms: none Events:  rare Hematocrit: 23  Fixed speed: 5500 Low speed limit: 5200   Drive Line: Dressing C/D/I; anchor intact and accurately applied. Weekly dressing changes per bedside nurse; next dressing change due 06/09/19.  Labs:  LDH trend: 200>186>156>159>174>155>158>164  INR trend: 2.5>2.3>1.9>1.8>1.7>1.7>1.7>2.0>2.1  Anticoagulation Plan: -INR Goal: 2.0 - 3.0 -ASA Dose: 81 mg daily   Device: St Jude dual ICD - Therapies: on 200 bpm - Last check: 01/01/18 - 2 hrs Afl/Afib; EMI 04/02/18  Blood: 06/05/19> 1 PRBC  Infection:  - BC x 2 on 05/30/19>>no growth x 5 days  Plan/Recommendations:  1. Call VAD pager if any questions about VAD equipment or drive line.   [name] RN [place] Coordinator  Office: [phone]  24/7 Pager: [phone]

## 2019-06-05 NOTE — Progress Notes (Signed)
[name] NEPHROLOGY PROGRESS NOTE  Assessment/ Plan: Pt is a 53 y.o. yo male ESRD on HD, CAD, severe ICM status post LVAD, DM, A. fib admitted for amputation of left great toe.   Physical Exam: General:NAD, comfortable Heart: Mechanical sound of LVAD device Lungs:clear b/l, no crackle Abdomen:soft, Non-tender, non-distended Extremities:No edema, dressing applied to the amputation site Dialysis Access: Left IJ [facility]  Dialysis:[facility] MWF  4h 43mn     400/800   84kg   2K/3Ca   Hep none   TDC Venofer 1041mIV x 5 until 9/28 Mircera 200 mcg IV q 2 weeks (last 9/16)  Hectorol 27m47mIV TIW   Assessment/ Plan #  PAD bilateral LE's: SP L great toe amp on 9/23.  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 Per VVS.   # ESRD -HD MWF.  HD today. Pt requesting 1st shift when possible.  # Hypertension/volume - No volume excess on exam. Under dry 2.5kg which is close to expected post- AKA.   # Anemia -drop in hemoglobin likely due to blood loss during surgery . On ESA as outpatient, last Mircera on 9/16. Next esa ordered for 9/30 here. To get 1 unit prbc's on HD today.   # Metabolic bone disease -Continue VDRA/binders.    # CAD/ICM s/p LVAD - per HF team   RobKelly SplinterD 06/05/2019, 12:13 PM      Subjective:  Seen in room, no c/o  Objective Vital signs in last 24 hours: Vitals:   06/05/19 0500 06/05/19 0615 06/05/19 0741 06/05/19 0759  BP:   121/75   Pulse:      Resp:  14  19  Temp:      TempSrc:      SpO2:  97%  95%  Weight: 82.2 kg     Height:       Weight change: -1.5 kg  Intake/Output Summary (Last 24 hours) at 06/05/2019 1213 Last data filed at 06/05/2019 0410 Gross per 24 hour  Intake 1393.46 ml  Output -  Net 1393.46 ml       Labs: Basic Metabolic Panel: Recent Labs  Lab 06/02/19 0340 06/02/19 1731 06/03/19 0800  NA 134* 133* 136  K 3.7 4.7 3.7  CL 96* 95* 105  CO2 24 20* 14*  GLUCOSE 192* 245* 278*  BUN 42* 50* 53*   CREATININE 9.72* 10.59* 10.10*  CALCIUM 8.7* 8.8* 7.2*  PHOS  --  8.5*  --    Liver Function Tests: Recent Labs  Lab 06/02/19 1731  ALBUMIN 2.2*   No results for input(s): LIPASE, AMYLASE in the last 168 hours. No results for input(s): AMMONIA in the last 168 hours. CBC: Recent Labs  Lab 06/02/19 0340 06/02/19 1731 06/03/19 0800 06/04/19 0430 06/05/19 0422  WBC 23.2* 27.6* 32.5* 34.8* 29.0*  HGB 8.0* 7.7* 7.1* 7.9* 7.3*  HCT 25.5* 25.4* 23.4* 24.8* 24.2*  MCV 86.4 87.3 86.0 84.9 86.7  PLT 294 347 366 446* 465*   Cardiac Enzymes: No results for input(s): CKTOTAL, CKMB, CKMBINDEX, TROPONINI in the last 168 hours. CBG: Recent Labs  Lab 06/04/19 2120 06/04/19 2140 06/04/19 2206 06/05/19 0617 06/05/19 1117  GLUCAP 40* 43* 109* 138* 124*    Iron Studies: No results for input(s): IRON, TIBC, TRANSFERRIN, FERRITIN in the last 72 hours. Studies/Results: No results found.  Medications: Infusions: . sodium chloride 10 mL/hr at 06/01/19 1200  . sodium chloride 10 mL/hr at 06/02/19 0707  . heparin 1,300 Units/hr (06/05/19 1116)  .  meropenem (MERREM) IV 500 mg (06/05/19 0951)  . vancomycin Stopped (06/03/19 1330)    Scheduled Medications: . sodium chloride   Intravenous Once  . aspirin EC  81 mg Oral Daily  . atorvastatin  40 mg Oral q1800  . Chlorhexidine Gluconate Cloth  6 each Topical Daily  . Chlorhexidine Gluconate Cloth  6 each Topical Q0600  . Chlorhexidine Gluconate Cloth  6 each Topical Q0600  . Chlorhexidine Gluconate Cloth  6 each Topical Q0600  . citalopram  20 mg Oral Daily  . darbepoetin (ARANESP) injection - DIALYSIS  200 mcg Intravenous Q Wed-HD  . doxercalciferol  2 mcg Intravenous Q M,W,F-HD  . dronabinol  2.5 mg Oral BID AC  . erythromycin  250 mg Oral TID  . fentaNYL   Intravenous Q4H  . gabapentin  300 mg Oral BID  . hydrALAZINE  25 mg Oral 3 times per day on Sun Tue Thu Sat  . influenza vac split quadrivalent PF  0.5 mL Intramuscular  Tomorrow-1000  . insulin aspart  0-9 Units Subcutaneous TID WC  . insulin glargine  10 Units Subcutaneous Daily  . metoCLOPramide  10 mg Oral TID WC  . midodrine  5 mg Oral Q M,W,F  . multivitamin  1 tablet Oral QHS  . pantoprazole  40 mg Oral BID AC  . sevelamer carbonate  2,400 mg Oral TID WC  . warfarin  2.5 mg Oral ONCE-1800  . warfarin  2.5 mg Oral ONCE-1800  . Warfarin - Pharmacist Dosing Inpatient   Does not apply q1800    have reviewed scheduled and prn medications.

## 2019-06-05 NOTE — Progress Notes (Signed)
ANTICOAGULATION CONSULT NOTE - Follow Up Consult  Pharmacy Consult for Heparin, Resume warfarin today Indication: LVAD HM3  Allergies  Allergen Reactions  . Metformin And Related Diarrhea    Patient Measurements: Height: 5' 7.99" (172.7 cm) Weight: 181 lb 3.5 oz (82.2 kg) IBW/kg (Calculated) : 68.38   Vital Signs: Temp: 98.4 F (36.9 C) (09/29 2351) Temp Source: Oral (09/29 2351) BP: 121/75 (09/30 0741)  Labs: Recent Labs    06/02/19 1731 06/03/19 0412 06/03/19 0800 06/04/19 0430 06/04/19 0431 06/05/19 0422  HGB 7.7*  --  7.1* 7.9*  --  7.3*  HCT 25.4*  --  23.4* 24.8*  --  24.2*  PLT 347  --  366 446*  --  465*  LABPROT  --  19.9*  --  22.7*  --  22.9*  INR  --  1.7*  --  2.0*  --  2.1*  HEPARINUNFRC  --  0.46  --   --  0.54 0.49  CREATININE 10.59*  --  10.10*  --   --   --     Estimated Creatinine Clearance: 8.7 mL/min (A) (by C-G formula based on SCr of 10.1 mg/dL (H)).   Medical History: Past Medical History:  Diagnosis Date  . Angina   . ASCVD (arteriosclerotic cardiovascular disease)    , Anterior infarction 2005, LAD diagonal bifurcation intervention 03/2004  . Automatic implantable cardiac defibrillator -St. Jude's       . Benign neoplasm of colon   . CHF (congestive heart failure) ([place])   . Chronic systolic heart failure ([place])   . Coronary artery disease     Widely patent previously placed stents in the left anterior   . Crohn's disease ([place])   . Deep venous thrombosis (HCC)    Recurrent-on Coumadin  . Dialysis patient ([place])   . Dyspnea   . Gastroparesis   . GERD (gastroesophageal reflux disease)   . High cholesterol   . Hyperlipidemia   . Hypersomnolent    Previous diagnosis of narcolepsy  . Hypertension, essential   . Ischemic cardiomyopathy    Ejection fraction 15-20% catheterization 2010  . Type II or unspecified type diabetes mellitus without mention of complication, not stated as uncontrolled   . Unspecified gastritis and  gastroduodenitis without mention of hemorrhage     Assessment: 9 yoM on warfarin PTA for LVAD and hx FV Leiden admitted for ongoing foot ischemia. 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  May need further intervention (L AKA) by VVS, but no immediate plans, so pharmacy asked to resume Coumadin.  INR elevated today at 2.1, despite no warfarin in over a week.  Patient not eating much.  Did not receive dose of warfarin as ordered last night because he was too sleepy for safe po intake per RN.  Also with some stump site oozing/bleeding.  PTA warfarin 7.33m Tu/Fr 517mQOD  Goal of Therapy:  INR 2-3  Heparin Level 0.3-0.5 Monitor platelets by anticoagulation protocol: Yes   Plan:  Cont heparin, but reduce to to 1300.  Will target lower heparin level goal given elevated INR and stump bleeding. Restart warfarin with 2.5 mg x 1 tonight. Daily heparin level, CBC and INR.  JeMarguerite OleaBCMaryland Diagnostic And Therapeutic Endo Center LLClinical Pharmacist Phone ([phone]/30/2020 10:41 AM

## 2019-06-05 NOTE — Progress Notes (Signed)
Inpatient Diabetes Program Recommendations  AACE/ADA: New Consensus Statement on Inpatient Glycemic Control (2015)  Target Ranges:  Prepandial:   less than 140 mg/dL      Peak postprandial:   less than 180 mg/dL (1-2 hours)      Critically ill patients:  140 - 180 mg/dL   Lab Results  Component Value Date   GLUCAP 138 (H) 06/05/2019   HGBA1C 6.6 03/15/2019    Review of Glycemic Control Results for [name], [name] (MRN [id number]) as of 06/05/2019 08:43  Ref. Range 06/04/2019 15:38 06/04/2019 21:20 06/04/2019 21:40 06/04/2019 22:06 06/05/2019 06:17  Glucose-Capillary Latest Ref Range: 70 - 99 mg/dL 257 (H) Novolog 12 @ 1834 40 (LL) 43 (LL) 109 (H) 138 (H)   Inpatient Diabetes Program Recommendations:   Hypoglycemia post Novolog total of 12 units @ 1834. -DC Novolog 7 units ac supper  Thank you, Bethena Roys E. Giovannie Scerbo, RN, MSN, CDE  Diabetes Coordinator Inpatient Glycemic Control Team Team Pager [phone] (8am-5pm) 06/05/2019 8:48 AM

## 2019-06-06 LAB — TYPE AND SCREEN
ABO/RH(D): O POS
Antibody Screen: NEGATIVE
Unit division: 0

## 2019-06-06 LAB — CBC
HCT: 25.1 % — ABNORMAL LOW (ref 39.0–52.0)
Hemoglobin: 8 g/dL — ABNORMAL LOW (ref 13.0–17.0)
MCH: 27.6 pg (ref 26.0–34.0)
MCHC: 31.9 g/dL (ref 30.0–36.0)
MCV: 86.6 fL (ref 80.0–100.0)
Platelets: 409 10*3/uL — ABNORMAL HIGH (ref 150–400)
RBC: 2.9 MIL/uL — ABNORMAL LOW (ref 4.22–5.81)
RDW: 18.2 % — ABNORMAL HIGH (ref 11.5–15.5)
WBC: 24 10*3/uL — ABNORMAL HIGH (ref 4.0–10.5)
nRBC: 0.6 % — ABNORMAL HIGH (ref 0.0–0.2)

## 2019-06-06 LAB — BPAM RBC
Blood Product Expiration Date: 202010032359
ISSUE DATE / TIME: 202009301518
Unit Type and Rh: 5100

## 2019-06-06 LAB — GLUCOSE, CAPILLARY
Glucose-Capillary: 165 mg/dL — ABNORMAL HIGH (ref 70–99)
Glucose-Capillary: 155 mg/dL — ABNORMAL HIGH (ref 70–99)
Glucose-Capillary: 127 mg/dL — ABNORMAL HIGH (ref 70–99)
Glucose-Capillary: 156 mg/dL — ABNORMAL HIGH (ref 70–99)

## 2019-06-06 LAB — PROTIME-INR
INR: 1.8 — ABNORMAL HIGH (ref 0.8–1.2)
Prothrombin Time: 20.4 seconds — ABNORMAL HIGH (ref 11.4–15.2)

## 2019-06-06 LAB — HEPARIN LEVEL (UNFRACTIONATED): Heparin Unfractionated: 0.43 IU/mL (ref 0.30–0.70)

## 2019-06-06 LAB — LACTATE DEHYDROGENASE: LDH: 174 U/L (ref 98–192)

## 2019-06-06 MED ORDER — CHLORHEXIDINE GLUCONATE CLOTH 2 % EX PADS
6.0000 | MEDICATED_PAD | Freq: Every day | CUTANEOUS | Status: DC
  Administered 2019-06-07 – 2019-06-16 (×10): 6 via TOPICAL

## 2019-06-06 MED ORDER — WARFARIN SODIUM 2.5 MG PO TABS
2.5000 mg | ORAL_TABLET | Freq: Once | ORAL | Status: AC
  Administered 2019-06-06: 2.5 mg via ORAL
  Filled 2019-06-06: qty 1

## 2019-06-06 MED ORDER — OXYCODONE HCL 5 MG PO TABS
5.0000 mg | ORAL_TABLET | ORAL | Status: DC | PRN
  Administered 2019-06-06 – 2019-06-14 (×17): 5 mg via ORAL
  Filled 2019-06-06 (×17): qty 1

## 2019-06-06 MED ORDER — FENTANYL CITRATE (PF) 100 MCG/2ML IJ SOLN
25.0000 ug | Freq: Four times a day (QID) | INTRAMUSCULAR | Status: DC | PRN
  Administered 2019-06-06 – 2019-06-10 (×7): 25 ug via INTRAVENOUS
  Filled 2019-06-06 (×7): qty 2

## 2019-06-06 NOTE — Progress Notes (Signed)
ANTICOAGULATION CONSULT NOTE - Follow Up Consult  Pharmacy Consult for heparin + warfarin Indication: LVAD HM3  Allergies  Allergen Reactions  . Metformin And Related Diarrhea    Patient Measurements: Height: 5' 7.99" (172.7 cm) Weight: 174 lb 2.6 oz (79 kg) IBW/kg (Calculated) : 68.38   Vital Signs: Temp: 97.6 F (36.4 C) (10/01 0750) Temp Source: Oral (10/01 0750) BP: 98/72 (10/01 0750) Pulse Rate: 93 (10/01 0750)  Labs: Recent Labs    06/04/19 0430  06/05/19 0422 06/05/19 1414 06/05/19 1944 06/06/19 0426  HGB 7.9*  --  7.3*  --   --  8.0*  HCT 24.8*  --  24.2*  --   --  25.1*  PLT 446*  --  465*  --   --  409*  LABPROT 22.7*  --  22.9*  --   --  20.4*  INR 2.0*  --  2.1*  --   --  1.8*  HEPARINUNFRC  --    < > 0.49  --  0.41 0.43  CREATININE  --   --   --  9.58*  --   --    < > = values in this interval not displayed.    Estimated Creatinine Clearance: 8.5 mL/min (A) (by C-G formula based on SCr of 9.58 mg/dL (H)).   Medical History: Past Medical History:  Diagnosis Date  . Angina   . ASCVD (arteriosclerotic cardiovascular disease)    , Anterior infarction 2005, LAD diagonal bifurcation intervention 03/2004  . Automatic implantable cardiac defibrillator -St. Jude's       . Benign neoplasm of colon   . CHF (congestive heart failure) ([place])   . Chronic systolic heart failure ([place])   . Coronary artery disease     Widely patent previously placed stents in the left anterior   . Crohn's disease ([place])   . Deep venous thrombosis (HCC)    Recurrent-on Coumadin  . Dialysis patient ([place])   . Dyspnea   . Gastroparesis   . GERD (gastroesophageal reflux disease)   . High cholesterol   . Hyperlipidemia   . Hypersomnolent    Previous diagnosis of narcolepsy  . Hypertension, essential   . Ischemic cardiomyopathy    Ejection fraction 15-20% catheterization 2010  . Type II or unspecified type diabetes mellitus without mention of complication, not stated as  uncontrolled   . Unspecified gastritis and gastroduodenitis without mention of hemorrhage     Assessment: 61 yoM on warfarin PTA for LVAD and hx FV Leiden admitted for ongoing foot ischemia. 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  May need further intervention (L AKA) by VVS, but no immediate plans, so pharmacy asked to resume warfarin. First dose given  INR today is 1.8. Patient not eating much. Also with continuous stump site oozing/bleeding but no hematoma noted.  PTA warfarin 7.38m Tu/Fr 552mQOD  Goal of Therapy:  INR 2-3  Heparin Level 0.3-0.5 Monitor platelets by anticoagulation protocol: Yes   Plan:  Continue heparin at 1300 units/hr Warfarin 2.5 mg x 1 tonight. Daily heparin level, CBC and INR.  MeVertis KelchPharmD PGY2 Cardiology Pharmacy Resident Phone ([phone]/09/2018       9:17 AM  Please check [email or web address] for unit-specific pharmacist phone numbers

## 2019-06-06 NOTE — Progress Notes (Addendum)
  Progress Note    06/06/2019 8:58 AM 4 Days Post-Op  Subjective:  Requiring fentanyl PCA for R AKA pain   Vitals:   06/06/19 0613 06/06/19 0750  BP: 99/85 98/72  Pulse:  93  Resp:  (!) 22  Temp:  97.6 F (36.4 C)  SpO2:  99%    Physical Exam: Incisions:  R AKA incision with bloody ooz medial aspect; no palpable hematoma in this area; skin edges viable; L GT ray amp with dark skin edges   CBC    Component Value Date/Time   WBC 24.0 (H) 06/06/2019 0426   RBC 2.90 (L) 06/06/2019 0426   HGB 8.0 (L) 06/06/2019 0426   HGB 11.7 (L) 09/07/2016 1457   HCT 25.1 (L) 06/06/2019 0426   HCT 37.5 09/07/2016 1457   PLT 409 (H) 06/06/2019 0426   PLT 279 09/07/2016 1457   MCV 86.6 06/06/2019 0426   MCV 81 09/07/2016 1457   MCH 27.6 06/06/2019 0426   MCHC 31.9 06/06/2019 0426   RDW 18.2 (H) 06/06/2019 0426   RDW 14.9 09/07/2016 1457   LYMPHSABS 1.0 05/28/2019 1055   LYMPHSABS 1.3 09/07/2016 1457   MONOABS 1.5 (H) 05/28/2019 1055   EOSABS 0.4 05/28/2019 1055   EOSABS 0.2 09/07/2016 1457   BASOSABS 0.1 05/28/2019 1055   BASOSABS 0.0 09/07/2016 1457    BMET    Component Value Date/Time   NA 133 (L) 06/05/2019 1414   NA 144 05/11/2017 0938   K 4.4 06/05/2019 1414   CL 96 (L) 06/05/2019 1414   CO2 23 06/05/2019 1414   GLUCOSE 177 (H) 06/05/2019 1414   BUN 52 (H) 06/05/2019 1414   BUN 68 (H) 05/11/2017 0938   CREATININE 9.58 (H) 06/05/2019 1414   CREATININE 1.63 (H) 11/09/2016 1505   CALCIUM 8.3 (L) 06/05/2019 1414   CALCIUM 8.7 06/27/2017 1152   GFRNONAA 6 (L) 06/05/2019 1414   GFRNONAA 48 (L) 11/09/2016 1505   GFRAA 6 (L) 06/05/2019 1414   GFRAA 55 (L) 11/09/2016 1505    INR    Component Value Date/Time   INR 1.8 (H) 06/06/2019 0426     Intake/Output Summary (Last 24 hours) at 06/06/2019 0858 Last data filed at 06/06/2019 0400 Gross per 24 hour  Intake 1071.9 ml  Output 1461 ml  Net -389.1 ml     Assessment/Plan:  52 y.o. male is s/p right above knee  amputation  4 Days Post-Op  - Continuous blood drainage from medial aspect of R AKA incision however no palpable hematoma; may need a staple or two removed and packing if this continues - L GT ray amp incision with dark skin edges, continue to monitor; high risk for L AKA - WBC trending down; continue broad spectrum abx - Will need R AKA wrapped with vaso gauze, 4x4s, kerlex, and ACE prior to HD treatment tomorrow   Dagoberto Ligas, PA-C [facility] [phone] 06/06/2019 8:58 AM    I have independently interviewed and examined patient and agree with PA assessment and plan above.  Left foot continues to be demarcate.  High risk for left AKA as noted above.  Does not appear to be source of leukocytosis.  Will at least need follow-up in 2 weeks for staple removal right AKA site and suture removal left first toe amputation.  Kachina Niederer C. Donzetta Matters, MD [facility] [place] Office: [phone] Pager: [phone]

## 2019-06-06 NOTE — Progress Notes (Signed)
CSW contacted patient by phone in hospital room. Patient states he is feeling well today and that he started using the journal left yesterday. Patient also states that his daughter is looking into a ramp for when he comes home. CSW asked if it would be ok to contact daughter to assist with obtaining a ramp. Patient agreeable. CSW will continue to follow with VAD team. Raquel Sarna, [place], [place]

## 2019-06-06 NOTE — Progress Notes (Signed)
Patient ID: [name], male   DOB: 10-17-1965, 53 y.o.   MRN: [id number]   Advanced Heart Failure VAD Team Note  PCP-Cardiologist: No primary care provider on file.   Subjective:    9/27 S/P R AKA   Nausea better, eating now.  Still with pain right thigh surgical site but improving, using fentanyl PCA.  WBCs 35 => 29 => 24, afebrile.  He remains on vancomycin/meropenem.   Hgb 8, still some oozing at right leg amputation site but improving.  On heparin/warfarin, INR 1.8.   MAP 80s  LVAD INTERROGATION:  HeartMate 3 LVAD:   Flow 4.2 liters/min, speed 5500, power 4.3 PI 3.2  10+ PI events yesterday with HD. VAD interrogated personally. Parameters stable.   Objective:    Vital Signs:   Temp:  [97.6 F (36.4 C)-99.2 F (37.3 C)] 97.6 F (36.4 C) (10/01 0750) Pulse Rate:  [55-99] 93 (10/01 0750) Resp:  [10-25] 22 (10/01 0750) BP: (82-102)/(28-86) 98/72 (10/01 0750) SpO2:  [95 %-99 %] 99 % (10/01 0750) Weight:  [79 kg-81.3 kg] 79 kg (10/01 0357) Last BM Date: 06/03/19 Mean arterial Pressure 80s  Intake/Output:   Intake/Output Summary (Last 24 hours) at 06/06/2019 0830 Last data filed at 06/06/2019 0400 Gross per 24 hour  Intake 1071.9 ml  Output 1461 ml  Net -389.1 ml     Physical Exam   Physical Exam: General: Well appearing this am. NAD.  HEENT: Normal. Neck: Supple, JVP 7-8 cm. Carotids OK.  Cardiac:  Mechanical heart sounds with LVAD hum present.  Lungs:  CTAB, normal effort.  Abdomen:  NT, ND, no HSM. No bruits or masses. +BS  LVAD exit site: Well-healed and incorporated. Dressing dry and intact. No erythema or drainage. Stabilization device present and accurately applied. Driveline dressing changed daily per sterile technique. Extremities:  S/p right AKA and left great toe amputation.  Neuro:  Alert & oriented x 3. Cranial nerves grossly intact. Moves all 4 extremities w/o difficulty. Affect pleasant    Telemetry   NSR 80s personally reviewed.   Labs    Basic Metabolic Panel: Recent Labs  Lab 06/01/19 0555 06/02/19 0340 06/02/19 1731 06/03/19 0800 06/05/19 1414  NA 134* 134* 133* 136 133*  K 3.7 3.7 4.7 3.7 4.4  CL 95* 96* 95* 105 96*  CO2 25 24 20* 14* 23  GLUCOSE 131* 192* 245* 278* 177*  BUN 28* 42* 50* 53* 52*  CREATININE 6.70* 9.72* 10.59* 10.10* 9.58*  CALCIUM 8.8* 8.7* 8.8* 7.2* 8.3*  PHOS  --   --  8.5*  --  7.8*    Liver Function Tests: Recent Labs  Lab 06/02/19 1731 06/05/19 1414  ALBUMIN 2.2* 1.9*   No results for input(s): LIPASE, AMYLASE in the last 168 hours. No results for input(s): AMMONIA in the last 168 hours.  CBC: Recent Labs  Lab 06/02/19 1731 06/03/19 0800 06/04/19 0430 06/05/19 0422 06/06/19 0426  WBC 27.6* 32.5* 34.8* 29.0* 24.0*  HGB 7.7* 7.1* 7.9* 7.3* 8.0*  HCT 25.4* 23.4* 24.8* 24.2* 25.1*  MCV 87.3 86.0 84.9 86.7 86.6  PLT 347 366 446* 465* 409*    INR: Recent Labs  Lab 06/02/19 0340 06/03/19 0412 06/04/19 0430 06/05/19 0422 06/06/19 0426  INR 1.7* 1.7* 2.0* 2.1* 1.8*    Other results:  EKG:    Imaging   No results found.   Medications:     Scheduled Medications: . sodium chloride   Intravenous Once  . aspirin EC  81 mg  Oral Daily  . atorvastatin  40 mg Oral q1800  . Chlorhexidine Gluconate Cloth  6 each Topical Daily  . Chlorhexidine Gluconate Cloth  6 each Topical Q0600  . citalopram  20 mg Oral Daily  . darbepoetin (ARANESP) injection - DIALYSIS  200 mcg Intravenous Q Wed-HD  . doxercalciferol  2 mcg Intravenous Q M,W,F-HD  . dronabinol  2.5 mg Oral BID AC  . erythromycin  250 mg Oral TID  . gabapentin  300 mg Oral BID  . hydrALAZINE  25 mg Oral 3 times per day on Sun Tue Thu Sat  . influenza vac split quadrivalent PF  0.5 mL Intramuscular Tomorrow-1000  . insulin aspart  0-9 Units Subcutaneous TID WC  . insulin glargine  10 Units Subcutaneous Daily  . metoCLOPramide  10 mg Oral TID WC  . midodrine  5 mg Oral Q M,W,F  . multivitamin  1 tablet  Oral QHS  . pantoprazole  40 mg Oral BID AC  . sevelamer carbonate  2,400 mg Oral TID WC  . Warfarin - Pharmacist Dosing Inpatient   Does not apply q1800    Infusions: . sodium chloride 10 mL/hr at 06/01/19 1200  . sodium chloride 10 mL/hr at 06/02/19 0707  . heparin 1,300 Units/hr (06/06/19 0507)  . meropenem (MERREM) IV Stopped (06/05/19 1022)  . vancomycin Stopped (06/05/19 1750)    PRN Medications: sodium chloride, acetaminophen, docusate sodium, hydrALAZINE, LORazepam, LORazepam, oxyCODONE, promethazine, promethazine   Assessment/Plan:    1. 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  Also still with considerable right thigh surgical site pain, still using fentanyl PCA. Still some oozing at surgical site, less today.  - Remains on heparin gtt with INR 1.8 + warfarin.   - Concerned that he may eventually need further amputation on left, but no current plans.   2. Chronic systolic CHF: Ischemic cardiomyopathy. St Jude ICD. NSTEMI in March 2018 with DES to LAD and RCA, complicated by cardiogenic shock, low output requiring milrinone. Echo 8/18 with EF 20-25%, moderate MR. CPX (8/18) with severe functional impairment due to HF. He is s/p HM3 LVAD placement. He had pump thrombosis 6/19 due to [facility] outflow graft kinking. He had pump exchange for another HM3. Not a candidate for transplant with PAD. Volume status managed at HD.  - Continue ASA 81.   - INR 1.8, heparin gtt + warfarin.     3. ESRD: HD on M-W-F with midodrine pre-HD.   4.CAD: NSTEMI in 3/18. LHC with 99% ulcerated lesion proximal RCA with left to right collaterals, 95% mid LAD stenosis after mid LAD stent. s/p PCI to RCA and LAD on 11/25/16.No chest pain.  - Continue statin and ASA. 5. h/o DVTs: Factor V Leiden heterozygote. -Anticoagulated. 6. Diabetic gastroparesis: Long history of this.  Increased nausea post-op, may be related to increased narcotic use.  This  is now improved.  - Continue reglan 10 mg tid/ac and Marinol.  - Continue Protonix bid.  - He will continue erythromycinindefinitely.  - He will get low dose IV Ativan to help with nausea if needed.  - Limit narcotic use as much as possible. 7. HTN: Continue hydralazine 25 mg tid on non-HD days.  8. Pain: Improving, will stop PCA today.   9. ID: WBCs trending down now, suspect related to ischemic extremities. Abx expanded on 9/26 to vancomycin/meropenem.  Afebrile. Blood cultures negative.  10. Anemia: post-op anemia + anemia of renal disease.  Hgb  8 after 1 unit PRBCs with HD on 9/30.   11. Continue to work with PT.  ?If he will be mobile enough to go home versus [facility] at discharge.   Length of Stay: 74  Loralie Champagne, MD 06/06/2019, 8:30 AM  VAD Team --- VAD ISSUES ONLY--- Pager [phone] (7am - 7am)  Advanced Heart Failure Team  Pager [phone] (M-F; 7a - 4p)  Please contact [facility] for night-coverage after hours (4p -7a ) and weekends on [email or web address]

## 2019-06-06 NOTE — Plan of Care (Signed)
  Problem: Education: Goal: Patient will understand all VAD equipment and how it functions Outcome: Progressing Goal: Patient will be able to verbalize current INR target range and antiplatelet therapy for discharge home Outcome: Progressing   Problem: Cardiac: Goal: LVAD will function as expected and patient will experience no clinical alarms Outcome: Progressing   Problem: Education: Goal: Knowledge of the prescribed therapeutic regimen will improve Outcome: Progressing Goal: Ability to verbalize activity precautions or restrictions will improve Outcome: Progressing   Problem: Activity: Goal: Ability to perform//tolerate increased activity and mobilize with assistive devices will improve Outcome: Progressing   Problem: Self-Care: Goal: Ability to meet self-care needs will improve Outcome: Progressing   Problem: Pain Management: Goal: Pain level will decrease with appropriate interventions Outcome: Progressing   Problem: Skin Integrity: Goal: Demonstration of wound healing without infection will improve Outcome: Progressing

## 2019-06-06 NOTE — Progress Notes (Signed)
Pt declined working on ADL due to fatigue from being in chair since this morning. Continues to do well with lateral transfers.   06/06/19 1338  OT Visit Information  Last OT Received On 06/06/19  Assistance Needed +1  History of Present Illness Pt is a 53 y.o. male with hx of LVAD, ESRD, DM, CAD, HLD who is s/p right TMA on 04/30/2019 by Dr. Donzetta Matters for non healing amp site.  He had previously undergone ray amp of right 4th toes on 03/21/2019 by Dr. Donzetta Matters. He was seen 05/21/2019 by Dr. Aundra Dubin and was having difficulty walking due to pain in his right foot and is unable to put any weight on this foot. 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  Precautions  Precautions Fall  Precaution Comments LVAD  Required Braces or Orthoses Other Brace  Other Brace Darco shoe-- left foot  Pain Assessment  Pain Assessment Faces  Faces Pain Scale 6  Pain Location right residual limb  Pain Descriptors / Indicators Sore  Pain Intervention(s) Monitored during session;RN gave pain meds during session;Repositioned  Cognition  Arousal/Alertness Awake/alert  Behavior During Therapy WFL for tasks assessed/performed  Overall Cognitive Status Within Functional Limits for tasks assessed  ADL  General ADL Comments pt declined ADL training stating he was tired from having sat in chair for several hours.  Bed Mobility  Overal bed mobility Needs Assistance  Bed Mobility Sit to Supine  Sit to supine Min guard  General bed mobility comments increased time due to pain  Balance  Overall balance assessment Needs assistance  Sitting balance-[name] Good  Sitting balance - Comments Steady sitting balance at EOB  Restrictions  Weight Bearing Restrictions Yes  RLE Weight Bearing NWB  Other Position/Activity Restrictions  pt to weight bear on left heel with Darco shoe  Transfers  Overall transfer level Needs assistance  Transfers Lateral/Scoot Transfers   Lateral/Scoot Transfers Min guard   General transfer comment assisted for LVAD controller and lines, blocked L foot  OT - End of Session  Activity Tolerance Patient limited by fatigue  Patient left in bed;with call bell/phone within reach  Nurse Communication Mobility status  OT Assessment/Plan  OT Plan Discharge plan remains appropriate  OT Visit Diagnosis Muscle weakness (generalized) (M62.81);Pain  Pain - Right/Left Right  OT Frequency (ACUTE ONLY) Min 2X/week  Follow Up Recommendations LTACH;SNF  OT Equipment Other (comment) (drop arm commode)  AM-PAC OT "6 Clicks" Daily Activity Outcome Measure (Version 2)  Help from another person eating meals? 4  Help from another person taking care of personal grooming? 3  Help from another person toileting, which includes using toliet, bedpan, or urinal? 2  Help from another person bathing (including washing, rinsing, drying)? 2  Help from another person to put on and taking off regular upper body clothing? 4  Help from another person to put on and taking off regular lower body clothing? 2  6 Click Score 17  OT Goal Progression  Progress towards OT goals Progressing toward goals  Acute Rehab OT Goals  Patient Stated Goal to go home   OT Goal Formulation With patient  Time For Goal Achievement 06/13/19  Potential to Achieve Goals Good  OT Time Calculation  OT Start Time (ACUTE ONLY) 1305  OT Stop Time (ACUTE ONLY) 1320  OT Time Calculation (min) 15 min  OT General Charges  $OT Visit 1 Visit  OT Treatments  $Therapeutic Activity 8-22 mins  [name], OTR/L Acute Rehabilitation  Services Pager: [phone] Office: [phone]

## 2019-06-06 NOTE — Plan of Care (Signed)
  Problem: Education: Goal: Patient will understand all VAD equipment and how it functions Outcome: Progressing Goal: Patient will be able to verbalize current INR target range and antiplatelet therapy for discharge home Outcome: Progressing   Problem: Cardiac: Goal: LVAD will function as expected and patient will experience no clinical alarms Outcome: Progressing

## 2019-06-06 NOTE — Progress Notes (Signed)
LVAD Coordinator Rounding Note:  Admitted 05/28/19 per Dr. Aundra Dubin for heparin bridge for left toe amputation.    HM III LVAD implanted on 06/12/17 by Dr. Darcey Nora under Destination Therapy criteria. Pump exchange performed on 03/01/18 per Dr. Darcey Nora for pump thrombus.   Patient sitting up in bed watching TV. States pain is slightly better. Has PRN Fentanyl for pain management and Oxycodone.   Vital signs: Temp: 97.6 HR: 93 Doppler Pressure: 88 Automatic BP: 98/72 (81) O2 Sat: 99% RA  Wt: 184>186.9>184.5>179.4>181.2>174.1 lbs   LVAD interrogation reveals:  Speed: 5500 Flow: 4.3 Power: 4.4w PI: 3.2 Alarms: none Events:  40+ yesterday w/dialysis none today Hematocrit: 25  Fixed speed: 5500 Low speed limit: 5200   Drive Line: Dressing C/D/I; anchor intact and accurately applied. Weekly dressing changes per bedside nurse; next dressing change due 06/09/19.  Labs:  LDH trend: 200>186>156>159>174>155>158>164>174  INR trend: 2.5>2.3>1.9>1.8>1.7>1.7>1.7>2.0>2.1>1.8  Anticoagulation Plan: -INR Goal: 2.0 - 3.0 -ASA Dose: 81 mg daily   Device: St Jude dual ICD - Therapies: on 200 bpm - Last check: 01/01/18 - 2 hrs Afl/Afib; EMI 04/02/18  Blood: 06/05/19> 1 PRBC  Infection:  - BC x 2 on 05/30/19>>no growth x 5 days  Plan/Recommendations:  1. Call VAD pager if any questions about VAD equipment or drive line.   [name] RN [place] Coordinator  Office: [phone]  24/7 Pager: [phone]

## 2019-06-06 NOTE — Progress Notes (Signed)
Patient requested to see CSW to continue discussion from yesterday. Patient was able to verbalize full conversation from day before while sitting on the side of the bed eating lunch. Patient was fully engaged in conversation stating that at this point he would like to "continue to live". CSW along with PT and OT encouraged patient and provided him with some tools and goals to eventually get home as a new AKA. Patient shared that after the LVAD he was initially angry and then again after starting dialysis he was angry but in both situations began to live with the challenges posed by both the [place] dialysis. He admitted that he may have some anger about losing his leg. CSW discussed using a journal to document his thoughts and feelings along with food intake and medications. He noted that he gets nauseous and then leads to "hard days". Patient appeared to have good insight and willing to try interventions shared by CSW, PT and OT.  CSW will provide patient with a journal to begin as soon as he is able. CSW continues to follow along with VAD team. Raquel Sarna, [place], [place]

## 2019-06-06 NOTE — Progress Notes (Signed)
[facility] NEPHROLOGY PROGRESS NOTE  Assessment/ Plan: Pt is a 53 y.o. yo male ESRD on HD, CAD, severe ICM status post LVAD, DM, A. fib admitted for amputation of left great toe.   Physical Exam: General:NAD, comfortable Heart: Mechanical sound of LVAD device Lungs:clear b/l, no crackle Abdomen:soft, Non-tender, non-distended Extremities:No edema, dressing applied to the amputation site Dialysis Access: Left [facility]  Dialysis:[facility] MWF  4h 651mn     400/800   84kg   2K/3Ca   Hep none   TDC Venofer 1068mIV x 5 until 9/28 Mircera 200 mcg IV q 2 weeks (last 9/16)  Hectorol 51m21mIV TIW   Assessment/ Plan #  PAD bilateral LE's: SP L great toe amp on 9/23.  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 Per VVS.   # ESRD -HD MWF.  HD Friday. Pt requests 1st shift.  # Hypertension/volume - No volume excess on exam. Under dry 4 kg which is expected post AKA.   # Anemia -drop in hemoglobin likely due to blood loss during surgery . Last outpt Mircera on 9/16. Got darbe 200 ug here on 9/30.  Rec'd 1 unit prbc's 9/30. Hb 8 today.  # Metabolic bone disease -Continue VDRA/binders.    # CAD/ICM s/p LVAD - per HF team   RobKelly SplinterD 06/06/2019, 11:02 AM      Subjective:  Seen in room, no c/o  Objective Vital signs in last 24 hours: Vitals:   06/06/19 0408 06/06/19 0613 06/06/19 0750 06/06/19 0921  BP:  99/85 98/72   Pulse:   93 92  Resp: 16  (!) 22 18  Temp:   97.6 F (36.4 C)   TempSrc:   Oral   SpO2: 95%  99% 100%  Weight:      Height:       Weight change: -0.9 kg  Intake/Output Summary (Last 24 hours) at 06/06/2019 1102 Last data filed at 06/06/2019 0400 Gross per 24 hour  Intake 1071.9 ml  Output 1461 ml  Net -389.1 ml       Labs: Basic Metabolic Panel: Recent Labs  Lab 06/02/19 1731 06/03/19 0800 06/05/19 1414  NA 133* 136 133*  K 4.7 3.7 4.4  CL 95* 105 96*  CO2 20* 14* 23  GLUCOSE 245* 278* 177*  BUN 50* 53* 52*  CREATININE  10.59* 10.10* 9.58*  CALCIUM 8.8* 7.2* 8.3*  PHOS 8.5*  --  7.8*   Liver Function Tests: Recent Labs  Lab 06/02/19 1731 06/05/19 1414  ALBUMIN 2.2* 1.9*   No results for input(s): LIPASE, AMYLASE in the last 168 hours. No results for input(s): AMMONIA in the last 168 hours. CBC: Recent Labs  Lab 06/02/19 1731 06/03/19 0800 06/04/19 0430 06/05/19 0422 06/06/19 0426  WBC 27.6* 32.5* 34.8* 29.0* 24.0*  HGB 7.7* 7.1* 7.9* 7.3* 8.0*  HCT 25.4* 23.4* 24.8* 24.2* 25.1*  MCV 87.3 86.0 84.9 86.7 86.6  PLT 347 366 446* 465* 409*   Cardiac Enzymes: No results for input(s): CKTOTAL, CKMB, CKMBINDEX, TROPONINI in the last 168 hours. CBG: Recent Labs  Lab 06/04/19 2206 06/05/19 0617 06/05/19 1117 06/05/19 2232 06/06/19 0611  GLUCAP 109* 138* 124* 119* 165*    Iron Studies: No results for input(s): IRON, TIBC, TRANSFERRIN, FERRITIN in the last 72 hours. Studies/Results: No results found.  Medications: Infusions: . sodium chloride 10 mL/hr at 06/01/19 1200  . sodium chloride 10 mL/hr at 06/02/19 0707  . heparin 1,300 Units/hr (06/06/19 0507)  .  meropenem (MERREM) IV 500 mg (06/06/19 0846)  . vancomycin Stopped (06/05/19 1750)    Scheduled Medications: . sodium chloride   Intravenous Once  . aspirin EC  81 mg Oral Daily  . atorvastatin  40 mg Oral q1800  . Chlorhexidine Gluconate Cloth  6 each Topical Daily  . Chlorhexidine Gluconate Cloth  6 each Topical Q0600  . citalopram  20 mg Oral Daily  . darbepoetin (ARANESP) injection - DIALYSIS  200 mcg Intravenous Q Wed-HD  . doxercalciferol  2 mcg Intravenous Q M,W,F-HD  . dronabinol  2.5 mg Oral BID AC  . erythromycin  250 mg Oral TID  . gabapentin  300 mg Oral BID  . hydrALAZINE  25 mg Oral 3 times per day on Sun Tue Thu Sat  . influenza vac split quadrivalent PF  0.5 mL Intramuscular Tomorrow-1000  . insulin aspart  0-9 Units Subcutaneous TID WC  . insulin glargine  10 Units Subcutaneous Daily  . metoCLOPramide  10  mg Oral TID WC  . midodrine  5 mg Oral Q M,W,F  . multivitamin  1 tablet Oral QHS  . pantoprazole  40 mg Oral BID AC  . sevelamer carbonate  2,400 mg Oral TID WC  . warfarin  2.5 mg Oral ONCE-1800  . Warfarin - Pharmacist Dosing Inpatient   Does not apply q1800    have reviewed scheduled and prn medications.

## 2019-06-06 NOTE — Progress Notes (Signed)
Physical Therapy Treatment Patient Details Name: [name] MRN: [id number] DOB: 03-28-1966 Today's Date: 06/06/2019    History of Present Illness Pt is a 53 y.o. male with hx of LVAD, ESRD, DM, CAD, HLD who is s/p right TMA on 04/30/2019 by Dr. Donzetta Matters for non healing amp site.  He had previously undergone ray amp of right 4th toes on 03/21/2019 by Dr. Donzetta Matters. He was seen 05/21/2019 by Dr. Aundra Dubin and was having difficulty walking due to pain in his right foot and is unable to put any weight on this foot. 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    PT Comments    Pt admitted with above diagnosis.Pt was able to transfer with only set up of chair. Pt progressing well and if ramp is built could go home.  Pt currently with functional limitations due to balance ande ndurance deficits. Pt  will benefit from skilled PT to increase their independence and safety with mobility to allow discharge to the venue listed below.    Follow Up Recommendations  LTACH;Supervision/Assistance - 24 hour(HHPT,HHOT if family can get ramp built)     Equipment Recommendations  None recommended by PT    Recommendations for Other Services       Precautions / Restrictions Precautions Precautions: Fall Precaution Comments: LVAD Required Braces or Orthoses: Other Brace Other Brace: Darco shoe-- left foot Restrictions Weight Bearing Restrictions: Yes RLE Weight Bearing: Non weight bearing Other Position/Activity Restrictions:  pt to weight bear on left heel with Darco shoe    Mobility  Bed Mobility Overal bed mobility: Needs Assistance Bed Mobility: Sit to Supine;Supine to Sit     Supine to sit: Min guard     General bed mobility comments: Cues to initiate movement to EOB. Pt takes incr time to come to EOB.    Transfers Overall transfer level: Needs assistance Equipment used: Rolling walker (2 wheeled) Transfers: Lateral/Scoot Transfers          Lateral/Scoot Transfers: Min  guard;From elevated surface;+2 safety/equipment General transfer comment: Pt was able to scoot to drop arm recliner with min guard assist. Blocked his left foot so it didnt slide.  Pt takes incr time but was able to scoot to recliner.   Ambulation/Gait                 Stairs             Wheelchair Mobility    Modified Rankin (Stroke Patients Only)       Balance Overall balance assessment: Needs assistance Sitting-balance support: No upper extremity supported;Feet supported Sitting balance-[name]: Good Sitting balance - Comments: Steady sitting balance at EOB                                    Cognition Arousal/Alertness: Awake/alert Behavior During Therapy: WFL for tasks assessed/performed Overall Cognitive Status: Within Functional Limits for tasks assessed Area of Impairment: Following commands;Safety/judgement;Problem solving                       Following Commands: Follows one step commands consistently;Follows multi-step commands inconsistently Safety/Judgement: Decreased awareness of safety;Decreased awareness of deficits   Problem Solving: Requires verbal cues;Requires tactile cues General Comments: pt seemingly aware of changes that will be necessary for him now that he has had the amputation      Exercises General Exercises - Lower Extremity  Long Arc Quad: AROM;10 reps;Seated;Left Amputee Exercises Gluteal Sets: AROM;Right;10 reps;Supine Hip ABduction/ADduction: AROM;Right;10 reps;Seated Hip Flexion/Marching: AROM;Right;10 reps;Seated    General Comments General comments (skin integrity, edema, etc.): Left ramp handout in room for daughter who is coming this pm.        Pertinent Vitals/Pain Pain Assessment: Faces Faces Pain Scale: Hurts a little bit Pain Location: right residual limb Pain Descriptors / Indicators: Sore Pain Intervention(s): Limited activity within patient's tolerance;Monitored during  session;Repositioned    Home Living                      Prior Function            PT Goals (current goals can now be found in the care plan section) Acute Rehab PT Goals Patient Stated Goal: to go home  Progress towards PT goals: Progressing toward goals    Frequency    Min 3X/week      PT Plan Discharge plan needs to be updated    Co-evaluation              AM-PAC PT "6 Clicks" Mobility   Outcome Measure  Help needed turning from your back to your side while in a flat bed without using bedrails?: None Help needed moving from lying on your back to sitting on the side of a flat bed without using bedrails?: None Help needed moving to and from a bed to a chair (including a wheelchair)?: None Help needed standing up from a chair using your arms (e.g., wheelchair or bedside chair)?: Total Help needed to walk in hospital room?: Total Help needed climbing 3-5 steps with a railing? : Total 6 Click Score: 15    End of Session Equipment Utilized During Treatment: Gait belt Activity Tolerance: Patient limited by fatigue;Patient limited by pain Patient left: with call bell/phone within reach;in chair Nurse Communication: Mobility status PT Visit Diagnosis: Other abnormalities of gait and mobility (R26.89);Pain;Unsteadiness on feet (R26.81) Pain - Right/Left: Right Pain - part of body: Ankle and joints of foot     Time: 3016-0109 PT Time Calculation (min) (ACUTE ONLY): 23 min  Charges:  $Therapeutic Exercise: 8-22 mins $Therapeutic Activity: 8-22 mins                     [place] Pager:  [phone]  Office:  [place] 06/06/2019, 11:08 AM

## 2019-06-06 NOTE — Progress Notes (Signed)
CSW met at bedside with Zada Girt, VAD Coordinator to discuss goals of Care with patient. CSW discussed at length options for care from rehab to hospice. Patient shared that he has had an increase in "hard days" and unsure about which direction to take with options. Patient stated he was going to call his daughter after CSW and VAD Coordinator left the room. Patient appeared to be struggling with hard days and decision making at the time of visit. CSW continues to follow for supportive needs and goals of care. Raquel Sarna, [place], [place]

## 2019-06-07 ENCOUNTER — Ambulatory Visit: Payer: BC Managed Care – PPO | Admitting: Student in an Organized Health Care Education/Training Program

## 2019-06-07 LAB — BASIC METABOLIC PANEL
Anion gap: 11 (ref 5–15)
BUN: 38 mg/dL — ABNORMAL HIGH (ref 6–20)
CO2: 25 mmol/L (ref 22–32)
Calcium: 8.6 mg/dL — ABNORMAL LOW (ref 8.9–10.3)
Chloride: 98 mmol/L (ref 98–111)
Creatinine, Ser: 7.83 mg/dL — ABNORMAL HIGH (ref 0.61–1.24)
GFR calc Af Amer: 8 mL/min — ABNORMAL LOW (ref >60)
GFR calc non Af Amer: 7 mL/min — ABNORMAL LOW (ref >60)
Glucose, Bld: 131 mg/dL — ABNORMAL HIGH (ref 70–99)
Potassium: 4.2 mmol/L (ref 3.5–5.1)
Sodium: 134 mmol/L — ABNORMAL LOW (ref 135–145)

## 2019-06-07 LAB — CBC
HCT: 23.2 % — ABNORMAL LOW (ref 39.0–52.0)
Hemoglobin: 7.3 g/dL — ABNORMAL LOW (ref 13.0–17.0)
MCH: 27.8 pg (ref 26.0–34.0)
MCHC: 31.5 g/dL (ref 30.0–36.0)
MCV: 88.2 fL (ref 80.0–100.0)
Platelets: 400 10*3/uL (ref 150–400)
RBC: 2.63 MIL/uL — ABNORMAL LOW (ref 4.22–5.81)
RDW: 18.7 % — ABNORMAL HIGH (ref 11.5–15.5)
WBC: 25 10*3/uL — ABNORMAL HIGH (ref 4.0–10.5)
nRBC: 1.1 % — ABNORMAL HIGH (ref 0.0–0.2)

## 2019-06-07 LAB — GLUCOSE, CAPILLARY
Glucose-Capillary: 171 mg/dL — ABNORMAL HIGH (ref 70–99)
Glucose-Capillary: 162 mg/dL — ABNORMAL HIGH (ref 70–99)
Glucose-Capillary: 214 mg/dL — ABNORMAL HIGH (ref 70–99)
Glucose-Capillary: 148 mg/dL — ABNORMAL HIGH (ref 70–99)

## 2019-06-07 LAB — HEPARIN LEVEL (UNFRACTIONATED): Heparin Unfractionated: 0.27 IU/mL — ABNORMAL LOW (ref 0.30–0.70)

## 2019-06-07 LAB — LACTATE DEHYDROGENASE: LDH: 163 U/L (ref 98–192)

## 2019-06-07 LAB — PREPARE RBC (CROSSMATCH)

## 2019-06-07 LAB — PROTIME-INR
INR: 1.6 — ABNORMAL HIGH (ref 0.8–1.2)
Prothrombin Time: 18.4 seconds — ABNORMAL HIGH (ref 11.4–15.2)

## 2019-06-07 MED ORDER — WARFARIN SODIUM 5 MG PO TABS
5.0000 mg | ORAL_TABLET | Freq: Once | ORAL | Status: AC
  Administered 2019-06-07: 5 mg via ORAL
  Filled 2019-06-07: qty 1

## 2019-06-07 MED ORDER — OXYCODONE HCL 5 MG PO TABS
ORAL_TABLET | ORAL | Status: AC
  Administered 2019-06-07: 5 mg via ORAL
  Filled 2019-06-07: qty 1

## 2019-06-07 MED ORDER — SODIUM CHLORIDE 0.9% IV SOLUTION: Freq: Once | INTRAVENOUS | Status: DC

## 2019-06-07 MED ORDER — DOXERCALCIFEROL 4 MCG/2ML IV SOLN
INTRAVENOUS | Status: AC
  Administered 2019-06-07: 2 ug via INTRAVENOUS
  Filled 2019-06-07: qty 2

## 2019-06-07 MED ORDER — HEPARIN SODIUM (PORCINE) 1000 UNIT/ML IJ SOLN
INTRAMUSCULAR | Status: AC
  Filled 2019-06-07: qty 4

## 2019-06-07 NOTE — Progress Notes (Signed)
ANTICOAGULATION CONSULT NOTE - Follow Up Consult  Pharmacy Consult for heparin + warfarin Indication: LVAD HM3  Allergies  Allergen Reactions  . Metformin And Related Diarrhea    Patient Measurements: Height: 5' 7.99" (172.7 cm) Weight: 189 lb 2.5 oz (85.8 kg) IBW/kg (Calculated) : 68.38   Vital Signs: Temp: 98.4 F (36.9 C) (10/02 1112) Temp Source: Oral (10/02 1112) BP: 97/68 (10/02 1112) Pulse Rate: 85 (10/02 1112)  Labs: Recent Labs    06/05/19 0422 06/05/19 1414 06/05/19 1944 06/06/19 0426 06/07/19 0421  HGB 7.3*  --   --  8.0* 7.3*  HCT 24.2*  --   --  25.1* 23.2*  PLT 465*  --   --  409* 400  LABPROT 22.9*  --   --  20.4* 18.4*  INR 2.1*  --   --  1.8* 1.6*  HEPARINUNFRC 0.49  --  0.41 0.43 0.27*  CREATININE  --  9.58*  --   --  7.83*    Estimated Creatinine Clearance: 11.5 mL/min (A) (by C-G formula based on SCr of 7.83 mg/dL (H)).   Medical History: Past Medical History:  Diagnosis Date  . Angina   . ASCVD (arteriosclerotic cardiovascular disease)    , Anterior infarction 2005, LAD diagonal bifurcation intervention 03/2004  . Automatic implantable cardiac defibrillator -St. Jude's       . Benign neoplasm of colon   . CHF (congestive heart failure) ([place])   . Chronic systolic heart failure ([place])   . Coronary artery disease     Widely patent previously placed stents in the left anterior   . Crohn's disease ([place])   . Deep venous thrombosis ([facility])    Recurrent-on Coumadin  . Dialysis patient ([place])   . Dyspnea   . Gastroparesis   . GERD (gastroesophageal reflux disease)   . High cholesterol   . Hyperlipidemia   . Hypersomnolent    Previous diagnosis of narcolepsy  . Hypertension, essential   . Ischemic cardiomyopathy    Ejection fraction 15-20% catheterization 2010  . Type II or unspecified type diabetes mellitus without mention of complication, not stated as uncontrolled   . Unspecified gastritis and gastroduodenitis without mention of  hemorrhage     Assessment: 48 yoM on warfarin PTA for LVAD and hx [facility] admitted for ongoing foot ischemia. Pt now s/p L toe amputation (9/23) and R AKA (9/27) - warfarin was held for procedures  Heparin level slightly below goal at 0.27 today, but has been therapeutic previously. Pt has shown to be very sensitive to dose changes.  May need further intervention (L AKA) by VVS, but no immediate plans, so pharmacy asked to resume warfarin. INR today is 1.6. Patient not eating much. Also with continuous stump site oozing/bleeding but no hematoma noted. 1 unit PRBC given today.  PTA warfarin 7.14m Tu/Fr 551mQOD  Goal of Therapy:  INR 2-3  Heparin Level 0.3-0.5 Monitor platelets by anticoagulation protocol: Yes   Plan:  Continue heparin at 1300 units/hr - may need to cautiously increase rate tomorrow if heparin level is still low Warfarin 5 mg x 1 tonight. Stop heparin drip once INR at least 1.8 or higher Daily heparin level, CBC and INR.  MeVertis KelchPharmD PGY2 Cardiology Pharmacy Resident Phone ([phone]/10/2018       12:19 PM  Please check [email or web address] for unit-specific pharmacist phone numbers

## 2019-06-07 NOTE — Progress Notes (Signed)
Patient ID: [name], male   DOB: August 29, 1966, 53 y.o.   MRN: [id number]    Advanced Heart Failure VAD Team Note  PCP-Cardiologist: No primary care provider on file.   Subjective:    9/27 S/P R AKA   Nausea better, eating now.  Still with pain right thigh surgical site but improving, now off PCA.  WBCs 35 => 29 => 24 => 25, afebrile.  He remains on vancomycin/meropenem.   Hgb 7.3, still some oozing at right leg amputation site but improving.  On heparin/warfarin, INR 1.6.   MAP 80s-90s  LVAD INTERROGATION:  HeartMate 3 LVAD:   Flow 4.1 liters/min, speed 5500, power 4.3 PI 2.1. VAD interrogated personally. Parameters stable.   Objective:    Vital Signs:   Temp:  [97.5 F (36.4 C)-98.7 F (37.1 C)] 98.4 F (36.9 C) (10/02 0625) Pulse Rate:  [31-104] 80 (10/02 0625) Resp:  [9-28] 11 (10/02 0625) BP: (82-118)/(51-89) 100/80 (10/02 0625) SpO2:  [61 %-100 %] 98 % (10/02 0625) Weight:  [82 kg] 82 kg (10/02 0519) Last BM Date: 06/03/19 Mean arterial Pressure 80s-90s  Intake/Output:   Intake/Output Summary (Last 24 hours) at 06/07/2019 0748 Last data filed at 06/07/2019 0300 Gross per 24 hour  Intake 1082 ml  Output -  Net 1082 ml     Physical Exam   General: NAD Neck: No JVD, no thyromegaly or thyroid nodule.  Lungs: Clear to auscultation bilaterally with normal respiratory effort. CV: Nondisplaced PMI.  Heart regular S1/S2, no S3/S4, no murmur.  No peripheral edema.  No carotid bruit.  Normal pedal pulses.  Abdomen: Soft, nontender, no hepatosplenomegaly, no distention.  Skin: Intact without lesions or rashes.  Neurologic: Alert and oriented x 3.  Psych: Normal affect. Extremities: S/p right AKA with some oozing still at surgical site.  S/p left great toe amputation.   HEENT: Normal.      Telemetry   NSR 80s personally reviewed.   Labs   Basic Metabolic Panel: Recent Labs  Lab 06/02/19 0340 06/02/19 1731 06/03/19 0800 06/05/19 1414 06/07/19 0421   NA 134* 133* 136 133* 134*  K 3.7 4.7 3.7 4.4 4.2  CL 96* 95* 105 96* 98  CO2 24 20* 14* 23 25  GLUCOSE 192* 245* 278* 177* 131*  BUN 42* 50* 53* 52* 38*  CREATININE 9.72* 10.59* 10.10* 9.58* 7.83*  CALCIUM 8.7* 8.8* 7.2* 8.3* 8.6*  PHOS  --  8.5*  --  7.8*  --     Liver Function Tests: Recent Labs  Lab 06/02/19 1731 06/05/19 1414  ALBUMIN 2.2* 1.9*   No results for input(s): LIPASE, AMYLASE in the last 168 hours. No results for input(s): AMMONIA in the last 168 hours.  CBC: Recent Labs  Lab 06/03/19 0800 06/04/19 0430 06/05/19 0422 06/06/19 0426 06/07/19 0421  WBC 32.5* 34.8* 29.0* 24.0* 25.0*  HGB 7.1* 7.9* 7.3* 8.0* 7.3*  HCT 23.4* 24.8* 24.2* 25.1* 23.2*  MCV 86.0 84.9 86.7 86.6 88.2  PLT 366 446* 465* 409* 400    INR: Recent Labs  Lab 06/03/19 0412 06/04/19 0430 06/05/19 0422 06/06/19 0426 06/07/19 0421  INR 1.7* 2.0* 2.1* 1.8* 1.6*    Other results:  EKG:    Imaging   No results found.   Medications:     Scheduled Medications: . sodium chloride   Intravenous Once  . sodium chloride   Intravenous Once  . aspirin EC  81 mg Oral Daily  . atorvastatin  40 mg Oral q1800  .  Chlorhexidine Gluconate Cloth  6 each Topical Daily  . Chlorhexidine Gluconate Cloth  6 each Topical Q0600  . Chlorhexidine Gluconate Cloth  6 each Topical Q0600  . citalopram  20 mg Oral Daily  . darbepoetin (ARANESP) injection - DIALYSIS  200 mcg Intravenous Q Wed-HD  . doxercalciferol  2 mcg Intravenous Q M,W,F-HD  . dronabinol  2.5 mg Oral BID AC  . erythromycin  250 mg Oral TID  . gabapentin  300 mg Oral BID  . hydrALAZINE  25 mg Oral 3 times per day on Sun Tue Thu Sat  . influenza vac split quadrivalent PF  0.5 mL Intramuscular Tomorrow-1000  . insulin aspart  0-9 Units Subcutaneous TID WC  . insulin glargine  10 Units Subcutaneous Daily  . metoCLOPramide  10 mg Oral TID WC  . midodrine  5 mg Oral Q M,W,F  . multivitamin  1 tablet Oral QHS  . pantoprazole  40  mg Oral BID AC  . sevelamer carbonate  2,400 mg Oral TID WC  . Warfarin - Pharmacist Dosing Inpatient   Does not apply q1800    Infusions: . sodium chloride 10 mL/hr at 06/01/19 1200  . sodium chloride 10 mL/hr at 06/02/19 0707  . heparin 1,300 Units/hr (06/07/19 0300)  . meropenem (MERREM) IV 500 mg (06/06/19 0846)  . vancomycin Stopped (06/05/19 1750)    PRN Medications: sodium chloride, acetaminophen, docusate sodium, fentaNYL (SUBLIMAZE) injection, hydrALAZINE, LORazepam, LORazepam, oxyCODONE, promethazine, promethazine   Assessment/Plan:    1. 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  Also still with considerable right thigh surgical site pain, still using fentanyl PCA. Still some oozing at surgical site. - Remains on heparin gtt with INR 1.6 + warfarin.   - Concerned that he may eventually need further amputation on left, but no current plans.   2. Chronic systolic CHF: Ischemic cardiomyopathy. St Jude ICD. NSTEMI in March 2018 with DES to LAD and RCA, complicated by cardiogenic shock, low output requiring milrinone. Echo 8/18 with EF 20-25%, moderate MR. CPX (8/18) with severe functional impairment due to HF. He is s/p HM3 LVAD placement. He had pump thrombosis 6/19 due to [facility] outflow graft kinking. He had pump exchange for another HM3. Not a candidate for transplant with PAD. Volume status managed at HD.  - Continue ASA 81.   - INR 1.6, heparin gtt + warfarin.  Will stop heparin gtt when INR back to 1.8.  3. ESRD: HD on M-W-F with midodrine pre-HD.   4.CAD: NSTEMI in 3/18. LHC with 99% ulcerated lesion proximal RCA with left to right collaterals, 95% mid LAD stenosis after mid LAD stent. s/p PCI to RCA and LAD on 11/25/16.No chest pain.  - Continue statin and ASA. 5. h/o DVTs: Factor V Leiden heterozygote. -Anticoagulated. 6. Diabetic gastroparesis: Long history of this.  Increased nausea post-op, may be related to  increased narcotic use.  This is now improved.  - Continue reglan 10 mg tid/ac and Marinol.  - Continue Protonix bid.  - He will continue erythromycinindefinitely.  - He will get low dose IV Ativan to help with nausea if needed.  - Limit narcotic use as much as possible. 7. HTN: Continue hydralazine 25 mg tid on non-HD days.  8. Pain: Improving, now off PCA.  9. ID: WBCs trending down now slowly, suspect related to ischemic extremities. Abx expanded on 9/26 to vancomycin/meropenem.  Afebrile. Blood cultures negative.  10. Anemia: post-op anemia + anemia  of renal disease.  He got 1 unit PRBCs with HD on 9/30.  Hgb trending back down, 7.3 today.  Surgical site oozing plays a role.   - Will transfuse 1 unit PRBCs today.  11. Continue to work with PT.  ?If he will be mobile enough to go home versus [facility] at discharge. Family trying to get a ramp at his house.   Length of Stay: 10  Loralie Champagne, MD 06/07/2019, 7:48 AM  VAD Team --- VAD ISSUES ONLY--- Pager [phone] (7am - 7am)  Advanced Heart Failure Team  Pager [phone] (M-F; 7a - 4p)  Please contact [facility] for night-coverage after hours (4p -7a ) and weekends on [email or web address]

## 2019-06-07 NOTE — Progress Notes (Signed)
[name] NEPHROLOGY PROGRESS NOTE  Assessment/ Plan: Pt is a 53 y.o. yo male ESRD on HD, CAD, severe ICM status post LVAD, DM, A. fib admitted for amputation of left great toe.   Physical Exam: General:NAD, comfortable Heart: Mechanical sound of LVAD device Lungs:clear b/l, no crackle Abdomen:soft, Non-tender, non-distended Extremities:No edema, dressing applied to the amputation site Dialysis Access: Left IJ [facility]  Dialysis:[facility] MWF  4h 73mn     400/800   84kg   2K/3Ca   Hep none   TDC Venofer 1086mIV x 5 until 9/28 Mircera 200 mcg IV q 2 weeks (last 9/16)  Hectorol 19m71mIV TIW   Assessment/ Plan #  PAD bilateral LE's: SP L great toe amp on 9/23.  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 Per [facility].   # ESRD -HD MWF.  HD Friday. Pt requests 1st shift.  # Hypertension/volume - No volume excess on exam. UF 2 L today. Under prior dry wt post AKA.   # Anemia - last outpt Mircera on 9/16. Got darbe 200 ug here on 9/30 and ordered weekly.  Rec'd 1 unit prbc's 9/30 and to get 2nd prbc today 10/2. Hb 7.3 today.  # Metabolic bone disease -Continue VDRA/binders.    # CAD/ICM s/p LVAD - per HF team   RobKelly SplinterD 06/07/2019, 12:57 PM      Subjective:  Seen in room, no c/o  Objective Vital signs in last 24 hours: Vitals:   06/07/19 1045 06/07/19 1100 06/07/19 1112 06/07/19 1216  BP: (!) 116/50 92/65 97/68  92/81  Pulse: 90 87 85 85  Resp: (!) 24 16 (!) 24 (!) 22  Temp:   98.4 F (36.9 C) 98.6 F (37 C)  TempSrc:   Oral Oral  SpO2:   96% 96%  Weight:      Height:       Weight change: 0.7 kg  Intake/Output Summary (Last 24 hours) at 06/07/2019 1257 Last data filed at 06/07/2019 1112 Gross per 24 hour  Intake 892.59 ml  Output 1900 ml  Net -1007.41 ml       Labs: Basic Metabolic Panel: Recent Labs  Lab 06/02/19 1731 06/03/19 0800 06/05/19 1414 06/07/19 0421  NA 133* 136 133* 134*  K 4.7 3.7 4.4 4.2  CL 95* 105 96* 98  CO2 20*  14* 23 25  GLUCOSE 245* 278* 177* 131*  BUN 50* 53* 52* 38*  CREATININE 10.59* 10.10* 9.58* 7.83*  CALCIUM 8.8* 7.2* 8.3* 8.6*  PHOS 8.5*  --  7.8*  --    Liver Function Tests: Recent Labs  Lab 06/02/19 1731 06/05/19 1414  ALBUMIN 2.2* 1.9*   No results for input(s): LIPASE, AMYLASE in the last 168 hours. No results for input(s): AMMONIA in the last 168 hours. CBC: Recent Labs  Lab 06/03/19 0800 06/04/19 0430 06/05/19 0422 06/06/19 0426 06/07/19 0421  WBC 32.5* 34.8* 29.0* 24.0* 25.0*  HGB 7.1* 7.9* 7.3* 8.0* 7.3*  HCT 23.4* 24.8* 24.2* 25.1* 23.2*  MCV 86.0 84.9 86.7 86.6 88.2  PLT 366 446* 465* 409* 400   Cardiac Enzymes: No results for input(s): CKTOTAL, CKMB, CKMBINDEX, TROPONINI in the last 168 hours. CBG: Recent Labs  Lab 06/06/19 0611 06/06/19 1129 06/06/19 1623 06/06/19 2139 06/07/19 0631  GLUCAP 165* 155* 127* 156* 171*    Iron Studies: No results for input(s): IRON, TIBC, TRANSFERRIN, FERRITIN in the last 72 hours. Studies/Results: No results found.  Medications: Infusions: . sodium chloride 10 mL/hr at  06/01/19 1200  . sodium chloride 10 mL/hr at 06/02/19 0707  . heparin 1,300 Units/hr (06/07/19 0300)    Scheduled Medications: . sodium chloride   Intravenous Once  . sodium chloride   Intravenous Once  . aspirin EC  81 mg Oral Daily  . atorvastatin  40 mg Oral q1800  . Chlorhexidine Gluconate Cloth  6 each Topical Daily  . Chlorhexidine Gluconate Cloth  6 each Topical Q0600  . Chlorhexidine Gluconate Cloth  6 each Topical Q0600  . citalopram  20 mg Oral Daily  . darbepoetin (ARANESP) injection - DIALYSIS  200 mcg Intravenous Q Wed-HD  . doxercalciferol  2 mcg Intravenous Q M,W,F-HD  . dronabinol  2.5 mg Oral BID AC  . erythromycin  250 mg Oral TID  . gabapentin  300 mg Oral BID  . hydrALAZINE  25 mg Oral 3 times per day on Sun Tue Thu Sat  . influenza vac split quadrivalent PF  0.5 mL Intramuscular Tomorrow-1000  . insulin aspart  0-9  Units Subcutaneous TID WC  . insulin glargine  10 Units Subcutaneous Daily  . metoCLOPramide  10 mg Oral TID WC  . midodrine  5 mg Oral Q M,W,F  . multivitamin  1 tablet Oral QHS  . pantoprazole  40 mg Oral BID AC  . sevelamer carbonate  2,400 mg Oral TID WC  . Warfarin - Pharmacist Dosing Inpatient   Does not apply q1800    have reviewed scheduled and prn medications.

## 2019-06-07 NOTE — Plan of Care (Signed)
  Problem: Health Behavior/Discharge Planning: Goal: Ability to manage health-related needs will improve Outcome: Progressing   Problem: Clinical Measurements: Goal: Ability to maintain clinical measurements within normal limits will improve Outcome: Progressing Goal: Will remain free from infection Outcome: Progressing Goal: Diagnostic test results will improve Outcome: Progressing Goal: Respiratory complications will improve Outcome: Progressing Goal: Cardiovascular complication will be avoided Outcome: Progressing   Problem: Activity: Goal: Risk for activity intolerance will decrease Outcome: Progressing   Problem: Nutrition: Goal: Adequate nutrition will be maintained Outcome: Progressing   Problem: Coping: Goal: Level of anxiety will decrease Outcome: Progressing   Problem: Elimination: Goal: Will not experience complications related to bowel motility Outcome: Progressing   Problem: Pain Managment: Goal: General experience of comfort will improve Outcome: Progressing   Problem: Safety: Goal: Ability to remain free from injury will improve Outcome: Progressing   Problem: Skin Integrity: Goal: Risk for impaired skin integrity will decrease Outcome: Progressing   Problem: Education: Goal: Patient will understand all VAD equipment and how it functions Outcome: Progressing Goal: Patient will be able to verbalize current INR target range and antiplatelet therapy for discharge home Outcome: Progressing   Problem: Cardiac: Goal: LVAD will function as expected and patient will experience no clinical alarms Outcome: Progressing   Problem: Education: Goal: Knowledge of the prescribed therapeutic regimen will improve Outcome: Progressing Goal: Ability to verbalize activity precautions or restrictions will improve Outcome: Progressing Goal: Understanding of discharge needs will improve Outcome: Progressing   Problem: Clinical Measurements: Goal: Postoperative  complications will be avoided or minimized Outcome: Progressing   Problem: Self-Care: Goal: Ability to meet self-care needs will improve Outcome: Progressing   Problem: Self-Concept: Goal: Ability to maintain and perform role responsibilities to the fullest extent possible will improve Outcome: Progressing   Problem: Pain Management: Goal: Pain level will decrease with appropriate interventions Outcome: Progressing   Problem: Skin Integrity: Goal: Demonstration of wound healing without infection will improve Outcome: Progressing

## 2019-06-08 LAB — GLUCOSE, CAPILLARY
Glucose-Capillary: 235 mg/dL — ABNORMAL HIGH (ref 70–99)
Glucose-Capillary: 149 mg/dL — ABNORMAL HIGH (ref 70–99)
Glucose-Capillary: 150 mg/dL — ABNORMAL HIGH (ref 70–99)
Glucose-Capillary: 169 mg/dL — ABNORMAL HIGH (ref 70–99)

## 2019-06-08 LAB — TYPE AND SCREEN
ABO/RH(D): O POS
Antibody Screen: NEGATIVE
Unit division: 0

## 2019-06-08 LAB — CBC
HCT: 27.5 % — ABNORMAL LOW (ref 39.0–52.0)
Hemoglobin: 8.4 g/dL — ABNORMAL LOW (ref 13.0–17.0)
MCH: 27.3 pg (ref 26.0–34.0)
MCHC: 30.5 g/dL (ref 30.0–36.0)
MCV: 89.3 fL (ref 80.0–100.0)
Platelets: 386 10*3/uL (ref 150–400)
RBC: 3.08 MIL/uL — ABNORMAL LOW (ref 4.22–5.81)
RDW: 18.8 % — ABNORMAL HIGH (ref 11.5–15.5)
WBC: 28.4 10*3/uL — ABNORMAL HIGH (ref 4.0–10.5)
nRBC: 2.1 % — ABNORMAL HIGH (ref 0.0–0.2)

## 2019-06-08 LAB — PROTIME-INR
INR: 1.5 — ABNORMAL HIGH (ref 0.8–1.2)
Prothrombin Time: 17.6 seconds — ABNORMAL HIGH (ref 11.4–15.2)

## 2019-06-08 LAB — BASIC METABOLIC PANEL
Anion gap: 13 (ref 5–15)
BUN: 32 mg/dL — ABNORMAL HIGH (ref 6–20)
CO2: 23 mmol/L (ref 22–32)
Calcium: 8.5 mg/dL — ABNORMAL LOW (ref 8.9–10.3)
Chloride: 96 mmol/L — ABNORMAL LOW (ref 98–111)
Creatinine, Ser: 5.5 mg/dL — ABNORMAL HIGH (ref 0.61–1.24)
GFR calc Af Amer: 13 mL/min — ABNORMAL LOW (ref >60)
GFR calc non Af Amer: 11 mL/min — ABNORMAL LOW (ref >60)
Glucose, Bld: 183 mg/dL — ABNORMAL HIGH (ref 70–99)
Potassium: 3.8 mmol/L (ref 3.5–5.1)
Sodium: 132 mmol/L — ABNORMAL LOW (ref 135–145)

## 2019-06-08 LAB — BPAM RBC
Blood Product Expiration Date: 202010092359
ISSUE DATE / TIME: 202010021022
Unit Type and Rh: 5100

## 2019-06-08 LAB — LACTATE DEHYDROGENASE: LDH: 177 U/L (ref 98–192)

## 2019-06-08 LAB — HEPARIN LEVEL (UNFRACTIONATED): Heparin Unfractionated: 0.29 IU/mL — ABNORMAL LOW (ref 0.30–0.70)

## 2019-06-08 MED ORDER — WARFARIN SODIUM 5 MG PO TABS
5.0000 mg | ORAL_TABLET | Freq: Once | ORAL | Status: AC
  Administered 2019-06-08: 5 mg via ORAL
  Filled 2019-06-08: qty 1

## 2019-06-08 NOTE — Progress Notes (Addendum)
ANTICOAGULATION CONSULT NOTE - Follow Up Consult  Pharmacy Consult for heparin + warfarin Indication: LVAD HM3  Allergies  Allergen Reactions  . Metformin And Related Diarrhea    Patient Measurements: Height: 5' 7.99" (172.7 cm) Weight: 179 lb 0.2 oz (81.2 kg) IBW/kg (Calculated) : 68.38   Vital Signs: Temp: 98.1 F (36.7 C) (10/03 1133) Temp Source: Axillary (10/03 1133) BP: 87/72 (10/03 1133) Pulse Rate: 89 (10/03 1133)  Labs: Recent Labs    06/06/19 0426 06/07/19 0421 06/08/19 0426 06/08/19 0427  HGB 8.0* 7.3* 8.4*  --   HCT 25.1* 23.2* 27.5*  --   PLT 409* 400 386  --   LABPROT 20.4* 18.4* 17.6*  --   INR 1.8* 1.6* 1.5*  --   HEPARINUNFRC 0.43 0.27*  --  0.29*  CREATININE  --  7.83* 5.50*  --     Estimated Creatinine Clearance: 14.9 mL/min (A) (by C-G formula based on SCr of 5.5 mg/dL (H)).   Medical History: Past Medical History:  Diagnosis Date  . Angina   . ASCVD (arteriosclerotic cardiovascular disease)    , Anterior infarction 2005, LAD diagonal bifurcation intervention 03/2004  . Automatic implantable cardiac defibrillator -St. Jude's       . Benign neoplasm of colon   . CHF (congestive heart failure) ([place])   . Chronic systolic heart failure ([place])   . Coronary artery disease     Widely patent previously placed stents in the left anterior   . Crohn's disease ([place])   . Deep venous thrombosis ([facility])    Recurrent-on Coumadin  . Dialysis patient ([place])   . Dyspnea   . Gastroparesis   . GERD (gastroesophageal reflux disease)   . High cholesterol   . Hyperlipidemia   . Hypersomnolent    Previous diagnosis of narcolepsy  . Hypertension, essential   . Ischemic cardiomyopathy    Ejection fraction 15-20% catheterization 2010  . Type II or unspecified type diabetes mellitus without mention of complication, not stated as uncontrolled   . Unspecified gastritis and gastroduodenitis without mention of hemorrhage     Assessment: 67 yoM on warfarin PTA  for LVAD and hx [facility] Leiden admitted for ongoing foot ischemia. 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 Pharmacy dosing heparin and warfarin -heparin was placed on hold ~ 11:45am due to ongoing oozing at AKA site with plans to restart ~ 7:45pm -INR= 1.5   PTA warfarin 7.29m Tu/Fr 557mQOD  Goal of Therapy:  INR 2-3  Heparin Level 0.3-0.5 Monitor platelets by anticoagulation protocol: Yes   Plan:  Warfarin 5 mg x 1 tonight. Heparin on hold until ~ 7:45pm Stop heparin drip once INR at least 1.8 or higher Daily heparin level, CBC and INR.  AnHildred LaserPharmD Clinical Pharmacist **Pharmacist phone directory can now be found on [place] ([email or web address]).  Listed under MCMaurice  addendum -heparin restarted at 1300 units/hr -Daily heparin level and CBC -Watch oozing at AKA site  AnHildred LaserPharmD Clinical Pharmacist **Pharmacist phone directory can now be found on [facility] (PW [email or web address]).  Listed under MCDelhi

## 2019-06-08 NOTE — Plan of Care (Signed)
  Problem: Health Behavior/Discharge Planning: Goal: Ability to manage health-related needs will improve Outcome: Progressing   Problem: Clinical Measurements: Goal: Ability to maintain clinical measurements within normal limits will improve Outcome: Progressing Goal: Will remain free from infection Outcome: Progressing Goal: Diagnostic test results will improve Outcome: Progressing Goal: Respiratory complications will improve Outcome: Progressing Goal: Cardiovascular complication will be avoided Outcome: Progressing   Problem: Activity: Goal: Risk for activity intolerance will decrease Outcome: Progressing   Problem: Nutrition: Goal: Adequate nutrition will be maintained Outcome: Progressing   Problem: Coping: Goal: Level of anxiety will decrease Outcome: Progressing   Problem: Elimination: Goal: Will not experience complications related to bowel motility Outcome: Progressing   Problem: Pain Managment: Goal: General experience of comfort will improve Outcome: Progressing   Problem: Safety: Goal: Ability to remain free from injury will improve Outcome: Progressing   Problem: Skin Integrity: Goal: Risk for impaired skin integrity will decrease Outcome: Progressing   Problem: Education: Goal: Patient will understand all VAD equipment and how it functions Outcome: Progressing Goal: Patient will be able to verbalize current INR target range and antiplatelet therapy for discharge home Outcome: Progressing   Problem: Cardiac: Goal: LVAD will function as expected and patient will experience no clinical alarms Outcome: Progressing   Problem: Education: Goal: Knowledge of the prescribed therapeutic regimen will improve Outcome: Progressing Goal: Ability to verbalize activity precautions or restrictions will improve Outcome: Progressing Goal: Understanding of discharge needs will improve Outcome: Progressing   Problem: Activity: Goal: Ability to perform//tolerate  increased activity and mobilize with assistive devices will improve Outcome: Progressing   Problem: Clinical Measurements: Goal: Postoperative complications will be avoided or minimized Outcome: Progressing   Problem: Self-Care: Goal: Ability to meet self-care needs will improve Outcome: Progressing   Problem: Self-Concept: Goal: Ability to maintain and perform role responsibilities to the fullest extent possible will improve Outcome: Progressing   Problem: Pain Management: Goal: Pain level will decrease with appropriate interventions Outcome: Progressing   Problem: Skin Integrity: Goal: Demonstration of wound healing without infection will improve Outcome: Progressing

## 2019-06-08 NOTE — Plan of Care (Signed)
  Problem: Clinical Measurements: Goal: Ability to maintain clinical measurements within normal limits will improve Outcome: Progressing   Problem: Clinical Measurements: Goal: Will remain free from infection Outcome: Progressing   Problem: Clinical Measurements: Goal: Cardiovascular complication will be avoided Outcome: Progressing   Problem: Nutrition: Goal: Adequate nutrition will be maintained Outcome: Progressing   Problem: Coping: Goal: Level of anxiety will decrease Outcome: Progressing   Problem: Safety: Goal: Ability to remain free from injury will improve Outcome: Progressing   Problem: Cardiac: Goal: LVAD will function as expected and patient will experience no clinical alarms Outcome: Progressing

## 2019-06-08 NOTE — Plan of Care (Signed)
  Problem: Health Behavior/Discharge Planning: Goal: Ability to manage health-related needs will improve Outcome: Progressing   Problem: Clinical Measurements: Goal: Ability to maintain clinical measurements within normal limits will improve Outcome: Progressing Goal: Will remain free from infection Outcome: Progressing   Problem: Coping: Goal: Level of anxiety will decrease Outcome: Progressing   Problem: Pain Managment: Goal: General experience of comfort will improve Outcome: Progressing   Problem: Safety: Goal: Ability to remain free from injury will improve Outcome: Progressing

## 2019-06-08 NOTE — Progress Notes (Signed)
Patient refused Sevelamer Carbonate and explained for phosphorus binder since he is ESRD. He strongly refused it. HS [employer]

## 2019-06-08 NOTE — Progress Notes (Addendum)
Patient ID: [name], male   DOB: 03-07-1966, 53 y.o.   MRN: [id number]    Advanced Heart Failure VAD Team Note  PCP-Cardiologist: No primary care provider on file.   Subjective:    9/27 S/P R AKA   Nausea better, eating now.  Still with pain right thigh surgical site but improving, now off PCA.  WBCs 35 => 29 => 24 => 25 => 28, afebrile.  He has completed course of vancomycin/meropenem.   Hgb 8.4, still some oozing at right leg amputation site.  On heparin/warfarin, INR 1.5.  He got 1 unit PRBCs at HD on 10/2.   MAP 80s  LVAD INTERROGATION:  HeartMate 3 LVAD:   Flow 4.2 liters/min, speed 5500, power 4.3 PI 2.2. VAD interrogated personally. Parameters stable.   Objective:    Vital Signs:   Temp:  [98.4 F (36.9 C)-99.6 F (37.6 C)] 98.4 F (36.9 C) (10/03 0810) Pulse Rate:  [85-92] 85 (10/03 0810) Resp:  [12-22] 12 (10/03 0810) BP: (87-105)/(59-81) 105/75 (10/03 0810) SpO2:  [96 %-100 %] 99 % (10/03 0810) Weight:  [81.2 kg] 81.2 kg (10/03 0405) Last BM Date: 06/03/19 Mean arterial Pressure 80s  Intake/Output:   Intake/Output Summary (Last 24 hours) at 06/08/2019 1132 Last data filed at 06/08/2019 0900 Gross per 24 hour  Intake 1617.11 ml  Output -  Net 1617.11 ml     Physical Exam   General: Well appearing this am. NAD.  HEENT: Normal. Neck: Supple, JVP 7-8 cm. Carotids OK.  Cardiac:  Mechanical heart sounds with LVAD hum present.  Lungs:  CTAB, normal effort.  Abdomen:  NT, ND, no HSM. No bruits or masses. +BS  LVAD exit site: Well-healed and incorporated. Dressing dry and intact. No erythema or drainage. Stabilization device present and accurately applied. Driveline dressing changed daily per sterile technique. Extremities: S/p right AKA with some oozing still at surgical site.  S/p left great toe amputation.   HEENT: Normal.  Neuro: Alert/oriented x 3.      Telemetry   NSR 80s personally reviewed.   Labs   Basic Metabolic Panel: Recent Labs   Lab 06/02/19 1731 06/03/19 0800 06/05/19 1414 06/07/19 0421 06/08/19 0426  NA 133* 136 133* 134* 132*  K 4.7 3.7 4.4 4.2 3.8  CL 95* 105 96* 98 96*  CO2 20* 14* 23 25 23   GLUCOSE 245* 278* 177* 131* 183*  BUN 50* 53* 52* 38* 32*  CREATININE 10.59* 10.10* 9.58* 7.83* 5.50*  CALCIUM 8.8* 7.2* 8.3* 8.6* 8.5*  PHOS 8.5*  --  7.8*  --   --     Liver Function Tests: Recent Labs  Lab 06/02/19 1731 06/05/19 1414  ALBUMIN 2.2* 1.9*   No results for input(s): LIPASE, AMYLASE in the last 168 hours. No results for input(s): AMMONIA in the last 168 hours.  CBC: Recent Labs  Lab 06/04/19 0430 06/05/19 0422 06/06/19 0426 06/07/19 0421 06/08/19 0426  WBC 34.8* 29.0* 24.0* 25.0* 28.4*  HGB 7.9* 7.3* 8.0* 7.3* 8.4*  HCT 24.8* 24.2* 25.1* 23.2* 27.5*  MCV 84.9 86.7 86.6 88.2 89.3  PLT 446* 465* 409* 400 386    INR: Recent Labs  Lab 06/04/19 0430 06/05/19 0422 06/06/19 0426 06/07/19 0421 06/08/19 0426  INR 2.0* 2.1* 1.8* 1.6* 1.5*    Other results:  EKG:    Imaging   No results found.   Medications:     Scheduled Medications: . sodium chloride   Intravenous Once  . sodium chloride  Intravenous Once  . aspirin EC  81 mg Oral Daily  . atorvastatin  40 mg Oral q1800  . Chlorhexidine Gluconate Cloth  6 each Topical Daily  . Chlorhexidine Gluconate Cloth  6 each Topical Q0600  . Chlorhexidine Gluconate Cloth  6 each Topical Q0600  . citalopram  20 mg Oral Daily  . darbepoetin (ARANESP) injection - DIALYSIS  200 mcg Intravenous Q Wed-HD  . doxercalciferol  2 mcg Intravenous Q M,W,F-HD  . dronabinol  2.5 mg Oral BID AC  . erythromycin  250 mg Oral TID  . gabapentin  300 mg Oral BID  . hydrALAZINE  25 mg Oral 3 times per day on Sun Tue Thu Sat  . influenza vac split quadrivalent PF  0.5 mL Intramuscular Tomorrow-1000  . insulin aspart  0-9 Units Subcutaneous TID WC  . insulin glargine  10 Units Subcutaneous Daily  . metoCLOPramide  10 mg Oral TID WC  .  midodrine  5 mg Oral Q M,W,F  . multivitamin  1 tablet Oral QHS  . pantoprazole  40 mg Oral BID AC  . sevelamer carbonate  2,400 mg Oral TID WC  . Warfarin - Pharmacist Dosing Inpatient   Does not apply q1800    Infusions: . sodium chloride 10 mL/hr at 06/01/19 1200  . sodium chloride 10 mL/hr at 06/02/19 0707  . heparin 1,300 Units/hr (06/07/19 1702)    PRN Medications: sodium chloride, acetaminophen, docusate sodium, fentaNYL (SUBLIMAZE) injection, hydrALAZINE, LORazepam, LORazepam, oxyCODONE, promethazine, promethazine   Assessment/Plan:    1. 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  Pain at right thigh surgical site improving, off PCA. Still some oozing at surgical site. - Remains on heparin gtt with INR 1.5 + warfarin.  Will hold heparin gtt x 8 hrs with ongoing oozing.  - Concerned that he may eventually need further amputation on left, but no current plans.   2. Chronic systolic CHF: Ischemic cardiomyopathy. St Jude ICD. NSTEMI in March 2018 with DES to LAD and RCA, complicated by cardiogenic shock, low output requiring milrinone. Echo 8/18 with EF 20-25%, moderate MR. CPX (8/18) with severe functional impairment due to HF. He is s/p HM3 LVAD placement. He had pump thrombosis 6/19 due to [facility] outflow graft kinking. He had pump exchange for another HM3. Not a candidate for transplant with PAD. Volume status managed at HD.  - Continue ASA 81.   - INR 1.5, heparin gtt + warfarin.  Will stop heparin gtt when INR back to 1.8.  Hold heparin gtt x 8 hrs with oozing.  3. ESRD: HD on M-W-F with midodrine pre-HD.   4.CAD: NSTEMI in 3/18. LHC with 99% ulcerated lesion proximal RCA with left to right collaterals, 95% mid LAD stenosis after mid LAD stent. s/p PCI to RCA and LAD on 11/25/16.No chest pain.  - Continue statin and ASA. 5. h/o DVTs: Factor V Leiden heterozygote. -Anticoagulated. 6. Diabetic gastroparesis: Long  history of this.  Increased nausea post-op, may be related to increased narcotic use.  This is now improved.  - Continue reglan 10 mg tid/ac and Marinol.  - Continue Protonix bid.  - He will continue erythromycinindefinitely.  - He will get low dose IV Ativan to help with nausea if needed.  - Limit narcotic use as much as possible. 7. HTN: Continue hydralazine 25 mg tid on non-HD days.  8. Pain: Improving, now off PCA.  9. ID: Chronic WBC elevation likely related to ischemic  extremities. Abx expanded on 9/26 to vancomycin/meropenem, completed course on 10/2.  Afebrile. Blood cultures negative.  10. Anemia: post-op anemia + anemia of renal disease.  He got 1 unit PRBCs with HD on 9/30 and again on 10/2.  Hgb 8.4 today.  Surgical site oozing plays a role.   11. Continue to work with PT.  ?If he will be mobile enough to go home versus [facility] at discharge. Family trying to get a ramp at his house.   Length of Stay: 63  Loralie Champagne, MD 06/08/2019, 11:32 AM  VAD Team --- VAD ISSUES ONLY--- Pager [phone] (7am - 7am)  Advanced Heart Failure Team  Pager [phone] (M-F; 7a - 4p)  Please contact [facility] for night-coverage after hours (4p -7a ) and weekends on [email or web address]

## 2019-06-09 LAB — BASIC METABOLIC PANEL
Anion gap: 12 (ref 5–15)
BUN: 50 mg/dL — ABNORMAL HIGH (ref 6–20)
CO2: 23 mmol/L (ref 22–32)
Calcium: 8.7 mg/dL — ABNORMAL LOW (ref 8.9–10.3)
Chloride: 98 mmol/L (ref 98–111)
Creatinine, Ser: 7.37 mg/dL — ABNORMAL HIGH (ref 0.61–1.24)
GFR calc Af Amer: 9 mL/min — ABNORMAL LOW (ref >60)
GFR calc non Af Amer: 8 mL/min — ABNORMAL LOW (ref >60)
Glucose, Bld: 114 mg/dL — ABNORMAL HIGH (ref 70–99)
Potassium: 3.9 mmol/L (ref 3.5–5.1)
Sodium: 133 mmol/L — ABNORMAL LOW (ref 135–145)

## 2019-06-09 LAB — GLUCOSE, CAPILLARY
Glucose-Capillary: 174 mg/dL — ABNORMAL HIGH (ref 70–99)
Glucose-Capillary: 139 mg/dL — ABNORMAL HIGH (ref 70–99)
Glucose-Capillary: 131 mg/dL — ABNORMAL HIGH (ref 70–99)
Glucose-Capillary: 85 mg/dL (ref 70–99)

## 2019-06-09 LAB — HEPARIN LEVEL (UNFRACTIONATED): Heparin Unfractionated: 0.17 IU/mL — ABNORMAL LOW (ref 0.30–0.70)

## 2019-06-09 LAB — CBC
HCT: 25.3 % — ABNORMAL LOW (ref 39.0–52.0)
Hemoglobin: 7.9 g/dL — ABNORMAL LOW (ref 13.0–17.0)
MCH: 27.6 pg (ref 26.0–34.0)
MCHC: 31.2 g/dL (ref 30.0–36.0)
MCV: 88.5 fL (ref 80.0–100.0)
Platelets: 363 10*3/uL (ref 150–400)
RBC: 2.86 MIL/uL — ABNORMAL LOW (ref 4.22–5.81)
RDW: 18.6 % — ABNORMAL HIGH (ref 11.5–15.5)
WBC: 24 10*3/uL — ABNORMAL HIGH (ref 4.0–10.5)
nRBC: 0.5 % — ABNORMAL HIGH (ref 0.0–0.2)

## 2019-06-09 LAB — PROTIME-INR
INR: 1.6 — ABNORMAL HIGH (ref 0.8–1.2)
Prothrombin Time: 18.4 seconds — ABNORMAL HIGH (ref 11.4–15.2)

## 2019-06-09 LAB — LACTATE DEHYDROGENASE: LDH: 162 U/L (ref 98–192)

## 2019-06-09 MED ORDER — DOXERCALCIFEROL 4 MCG/2ML IV SOLN
1.0000 ug | INTRAVENOUS | Status: DC
  Administered 2019-06-10 – 2019-06-14 (×3): 1 ug via INTRAVENOUS
  Filled 2019-06-09 (×3): qty 2

## 2019-06-09 MED ORDER — WARFARIN SODIUM 7.5 MG PO TABS
7.5000 mg | ORAL_TABLET | Freq: Once | ORAL | Status: AC
  Administered 2019-06-09: 7.5 mg via ORAL
  Filled 2019-06-09: qty 1

## 2019-06-09 NOTE — Progress Notes (Addendum)
Subjective:  Seen in Room  With no cos, no Pain now in surgical sites  Objective Vital signs in last 24 hours: Vitals:   06/09/19 0443 06/09/19 0633 06/09/19 0745 06/09/19 1118  BP:  98/78 115/72   Pulse:   75   Resp:   13   Temp:   97.8 F (36.6 C) 98.4 F (36.9 C)  TempSrc:   Oral Oral  SpO2:   96%   Weight: 83.3 kg     Height:       Weight change: -2.5 kg  Physical Exam: General:alert , NAD, comfortable Heart: Mechanical sound of LVAD device hum  Lungs:CTAB  / Non labored  Abdomen: BS +, soft, NT, ND Extremities:No Pedal  Edema,/ SP  R AKA  With site clean and dry /  L Gr toe amp. Site clean and dry  Dialysis Access: Left IJ TDC  OP Dialysis:[facility] MWF  4h 991mn     400/800   84kg   2K/3Ca   Hep none   TDC Venofer 1078mIV x 5 until 9/28 Mircera 200 mcg IV q 2 weeks (last 9/16)  Hectorol 91m37mIV TIW  Problem/Plan: #  PAD bilateral LE's: SP L great toe amp on 9/23.  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 Per VVS.   # ESRD -HD MWF. HD tolerating  Next HD in am , K 3.9 /. Pt requests 1st shift.  # Hypertension/volume - No volume excess on exam. UF 2 L  Last hd  And in am /Appetite okay now . Under prior dry wt post AKA.   # Anemia - HGB 7.9 ,last outpt Mircera on 9/16.Got darbe 200 ug here on 9/30 and ordered weekly wed hd .  Rec'd 1 unit prbc's 9/30 and to get 2nd prbc 10/2.   # Metabolic bone disease -Continue ca , ca corec= 10.4 phos 7.8 , VDRA/binders. / decr hec with Corec. Ca >10 ,on renvela  Reck phos pre hd    # CAD/ICM s/p LVAD - per HF team   DavErnest HaberA-C [facility] Beeper [phone]/12/2018,1:09 PM  LOS: 12 days   Pt seen, examined and agree w A/P as above.  RobKelly SplinterD 06/09/2019, 4:16 PM    Labs: Basic Metabolic Panel: Recent Labs  Lab 06/02/19 1731  06/05/19 1414 06/07/19 0421 06/08/19 0426 06/09/19 0448  NA 133*   < > 133* 134* 132* 133*  K 4.7   < > 4.4 4.2 3.8 3.9  CL 95*   < > 96* 98 96*  98  CO2 20*   < > 23 25 23 23   GLUCOSE 245*   < > 177* 131* 183* 114*  BUN 50*   < > 52* 38* 32* 50*  CREATININE 10.59*   < > 9.58* 7.83* 5.50* 7.37*  CALCIUM 8.8*   < > 8.3* 8.6* 8.5* 8.7*  PHOS 8.5*  --  7.8*  --   --   --    < > = values in this interval not displayed.   Liver Function Tests: Recent Labs  Lab 06/02/19 1731 06/05/19 1414  ALBUMIN 2.2* 1.9*   No results for input(s): LIPASE, AMYLASE in the last 168 hours. No results for input(s): AMMONIA in the last 168 hours. CBC: Recent Labs  Lab 06/05/19 0422 06/06/19 0426 06/07/19 0421 06/08/19 0426 06/09/19 0448  WBC 29.0* 24.0* 25.0* 28.4* 24.0*  HGB 7.3* 8.0* 7.3* 8.4* 7.9*  HCT 24.2* 25.1* 23.2* 27.5* 25.3*  MCV 86.7 86.6 88.2 89.3 88.5  PLT 465* 409* 400 386 363   Cardiac Enzymes: No results for input(s): CKTOTAL, CKMB, CKMBINDEX, TROPONINI in the last 168 hours. CBG: Recent Labs  Lab 06/08/19 1132 06/08/19 1546 06/08/19 2110 06/09/19 0609 06/09/19 1048  GLUCAP 149* 150* 169* 174* 139*    Studies/Results: No results found. Medications: . sodium chloride 10 mL/hr at 06/01/19 1200  . sodium chloride 10 mL/hr at 06/02/19 0707  . heparin 1,300 Units/hr (06/09/19 1100)   . sodium chloride   Intravenous Once  . sodium chloride   Intravenous Once  . aspirin EC  81 mg Oral Daily  . atorvastatin  40 mg Oral q1800  . Chlorhexidine Gluconate Cloth  6 each Topical Daily  . Chlorhexidine Gluconate Cloth  6 each Topical Q0600  . Chlorhexidine Gluconate Cloth  6 each Topical Q0600  . citalopram  20 mg Oral Daily  . darbepoetin (ARANESP) injection - DIALYSIS  200 mcg Intravenous Q Wed-HD  . doxercalciferol  2 mcg Intravenous Q M,W,F-HD  . dronabinol  2.5 mg Oral BID AC  . erythromycin  250 mg Oral TID  . gabapentin  300 mg Oral BID  . hydrALAZINE  25 mg Oral 3 times per day on Sun Tue Thu Sat  . influenza vac split quadrivalent PF  0.5 mL Intramuscular Tomorrow-1000  . insulin aspart  0-9 Units  Subcutaneous TID WC  . insulin glargine  10 Units Subcutaneous Daily  . metoCLOPramide  10 mg Oral TID WC  . midodrine  5 mg Oral Q M,W,F  . multivitamin  1 tablet Oral QHS  . pantoprazole  40 mg Oral BID AC  . sevelamer carbonate  2,400 mg Oral TID WC  . Warfarin - Pharmacist Dosing Inpatient   Does not apply [phone]

## 2019-06-09 NOTE — Progress Notes (Signed)
ANTICOAGULATION CONSULT NOTE - Follow Up Consult  Pharmacy Consult for heparin + warfarin Indication: LVAD HM3  Allergies  Allergen Reactions  . Metformin And Related Diarrhea    Patient Measurements: Height: 5' 7.99" (172.7 cm) Weight: 183 lb 10.3 oz (83.3 kg) IBW/kg (Calculated) : 68.38   Vital Signs: Temp: 98.4 F (36.9 C) (10/04 1118) Temp Source: Oral (10/04 1118) BP: 115/72 (10/04 0745) Pulse Rate: 75 (10/04 0745)  Labs: Recent Labs    06/07/19 0421 06/08/19 0426 06/08/19 0427 06/09/19 0448  HGB 7.3* 8.4*  --  7.9*  HCT 23.2* 27.5*  --  25.3*  PLT 400 386  --  363  LABPROT 18.4* 17.6*  --  18.4*  INR 1.6* 1.5*  --  1.6*  HEPARINUNFRC 0.27*  --  0.29* 0.17*  CREATININE 7.83* 5.50*  --  7.37*    Estimated Creatinine Clearance: 12.1 mL/min (A) (by C-G formula based on SCr of 7.37 mg/dL (H)).   Medical History: Past Medical History:  Diagnosis Date  . Angina   . ASCVD (arteriosclerotic cardiovascular disease)    , Anterior infarction 2005, LAD diagonal bifurcation intervention 03/2004  . Automatic implantable cardiac defibrillator -St. Jude's       . Benign neoplasm of colon   . CHF (congestive heart failure) ([place])   . Chronic systolic heart failure ([place])   . Coronary artery disease     Widely patent previously placed stents in the left anterior   . Crohn's disease ([place])   . 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 Dialysis patient ([place])   . Dyspnea   . Gastroparesis   . GERD (gastroesophageal reflux disease)   . High cholesterol   . Hyperlipidemia   . Hypersomnolent    Previous diagnosis of narcolepsy  . Hypertension, essential   . Ischemic cardiomyopathy    Ejection fraction 15-20% catheterization 2010  . Type II or unspecified type diabetes mellitus without mention of complication, not stated as uncontrolled   . Unspecified gastritis and gastroduodenitis without mention of hemorrhage     Assessment: 46 yoM on warfarin PTA  for LVAD and hx FV Leiden admitted for ongoing foot ischemia. 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 Pharmacy dosing heparin and warfarin -heparin was placed on hold 10/3 for stump oozing - now resolved ~  Heparin restarted 1300 uts/hr HL 0.17 < goal but with recent bleeding will not make any changes today and reassess in am  -INR= 1.6 continue to titrate warfarin dose   PTA warfarin 7.4m Tu/Fr 579mQOD  Goal of Therapy:  INR 2-3  Heparin Level 0.3-0.5 Monitor platelets by anticoagulation protocol: Yes   Plan:  Warfarin 7.5 mg x 1 tonight. Continue Heparin drip 1300 uts/hr  Stop heparin drip once INR at least 1.8 or if stump bleeding restarts Daily heparin level, CBC and INR.     LiBonnita Nasutiharm.D. CPP, BCPS Clinical Pharmacist [phone]/12/2018 3:30 PM

## 2019-06-09 NOTE — Progress Notes (Signed)
Attending MD gave verbal order to stop heparin gtt once bleeding noted on right AKA. Continue to monitor.

## 2019-06-09 NOTE — Progress Notes (Signed)
Patient ID: [name], male   DOB: 11/20/1965, 53 y.o.   MRN: [id number]    Advanced Heart Failure VAD Team Note  PCP-Cardiologist: No primary care provider on file.   Subjective:    9/27 S/P R AKA   Nausea better, eating now.  Still with pain right thigh surgical site but improving, now off PCA.  WBCs 35 => 29 => 24 => 25 => 28 => 24, afebrile.  He has completed course of vancomycin/meropenem.   Hgb 7.9, held heparin most of day and oozing at surgical site stopped.  Back on heparin overnight with no problems.  On heparin/warfarin, INR 1.6.  He got 1 unit PRBCs at HD on 10/2.   MAP 80s  LVAD INTERROGATION:  HeartMate 3 LVAD:   Flow 4.1 liters/min, speed 5500, power 4.4 PI 3.5.  1 PI event. VAD interrogated personally. Parameters stable.   Objective:    Vital Signs:   Temp:  [98.1 F (36.7 C)-98.7 F (37.1 C)] 98.7 F (37.1 C) (10/04 0400) Pulse Rate:  [46-116] 78 (10/04 0400) Resp:  [9-21] 13 (10/04 0400) BP: (83-105)/(66-85) 98/78 (10/04 0633) SpO2:  [92 %-100 %] 97 % (10/04 0400) Weight:  [83.3 kg] 83.3 kg (10/04 0443) Last BM Date: 06/03/19 Mean arterial Pressure 80s  Intake/Output:   Intake/Output Summary (Last 24 hours) at 06/09/2019 0746 Last data filed at 06/09/2019 0400 Gross per 24 hour  Intake 777.99 ml  Output -  Net 777.99 ml     Physical Exam   General: Well appearing this am. NAD.  HEENT: Normal. Neck: Supple, JVP 7-8 cm. Carotids OK.  Cardiac:  Mechanical heart sounds with LVAD hum present.  Lungs:  CTAB, normal effort.  Abdomen:  NT, ND, no HSM. No bruits or masses. +BS  LVAD exit site: Well-healed and incorporated. Dressing dry and intact. No erythema or drainage. Stabilization device present and accurately applied. Driveline dressing changed daily per sterile technique. Extremities:  S/p right AKA, surgical site oozing has stopped.  S/p left great toe amputation.   Neuro:  Alert & oriented x 3. Cranial nerves grossly intact. Moves all 4  extremities w/o difficulty. Affect pleasant     Telemetry   NSR 80s personally reviewed.   Labs   Basic Metabolic Panel: Recent Labs  Lab 06/02/19 1731 06/03/19 0800 06/05/19 1414 06/07/19 0421 06/08/19 0426 06/09/19 0448  NA 133* 136 133* 134* 132* 133*  K 4.7 3.7 4.4 4.2 3.8 3.9  CL 95* 105 96* 98 96* 98  CO2 20* 14* 23 25 23 23   GLUCOSE 245* 278* 177* 131* 183* 114*  BUN 50* 53* 52* 38* 32* 50*  CREATININE 10.59* 10.10* 9.58* 7.83* 5.50* 7.37*  CALCIUM 8.8* 7.2* 8.3* 8.6* 8.5* 8.7*  PHOS 8.5*  --  7.8*  --   --   --     Liver Function Tests: Recent Labs  Lab 06/02/19 1731 06/05/19 1414  ALBUMIN 2.2* 1.9*   No results for input(s): LIPASE, AMYLASE in the last 168 hours. No results for input(s): AMMONIA in the last 168 hours.  CBC: Recent Labs  Lab 06/05/19 0422 06/06/19 0426 06/07/19 0421 06/08/19 0426 06/09/19 0448  WBC 29.0* 24.0* 25.0* 28.4* 24.0*  HGB 7.3* 8.0* 7.3* 8.4* 7.9*  HCT 24.2* 25.1* 23.2* 27.5* 25.3*  MCV 86.7 86.6 88.2 89.3 88.5  PLT 465* 409* 400 386 363    INR: Recent Labs  Lab 06/05/19 0422 06/06/19 0426 06/07/19 0421 06/08/19 0426 06/09/19 0448  INR 2.1* 1.8*  1.6* 1.5* 1.6*    Other results:  EKG:    Imaging   No results found.   Medications:     Scheduled Medications: . sodium chloride   Intravenous Once  . sodium chloride   Intravenous Once  . aspirin EC  81 mg Oral Daily  . atorvastatin  40 mg Oral q1800  . Chlorhexidine Gluconate Cloth  6 each Topical Daily  . Chlorhexidine Gluconate Cloth  6 each Topical Q0600  . Chlorhexidine Gluconate Cloth  6 each Topical Q0600  . citalopram  20 mg Oral Daily  . darbepoetin (ARANESP) injection - DIALYSIS  200 mcg Intravenous Q Wed-HD  . doxercalciferol  2 mcg Intravenous Q M,W,F-HD  . dronabinol  2.5 mg Oral BID AC  . erythromycin  250 mg Oral TID  . gabapentin  300 mg Oral BID  . hydrALAZINE  25 mg Oral 3 times per day on Sun Tue Thu Sat  . influenza vac split  quadrivalent PF  0.5 mL Intramuscular Tomorrow-1000  . insulin aspart  0-9 Units Subcutaneous TID WC  . insulin glargine  10 Units Subcutaneous Daily  . metoCLOPramide  10 mg Oral TID WC  . midodrine  5 mg Oral Q M,W,F  . multivitamin  1 tablet Oral QHS  . pantoprazole  40 mg Oral BID AC  . sevelamer carbonate  2,400 mg Oral TID WC  . Warfarin - Pharmacist Dosing Inpatient   Does not apply q1800    Infusions: . sodium chloride 10 mL/hr at 06/01/19 1200  . sodium chloride 10 mL/hr at 06/02/19 0707  . heparin 1,300 Units/hr (06/09/19 0400)    PRN Medications: sodium chloride, acetaminophen, docusate sodium, fentaNYL (SUBLIMAZE) injection, hydrALAZINE, LORazepam, oxyCODONE, promethazine, promethazine   Assessment/Plan:    1. 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  Pain at right thigh surgical site improving, off PCA. Surgical site bleeding has resolved this morning. - Remains on heparin gtt with INR 1.6 + warfarin, stop heparin when INR 1.8.  Can stop heparin if surgical site starts to bleed again. - Concerned that he may eventually need further amputation on left, but no current plans.  Would like vascular to reassess left foot on Monday.   2. Chronic systolic CHF: Ischemic cardiomyopathy. St Jude ICD. NSTEMI in March 2018 with DES to LAD and RCA, complicated by cardiogenic shock, low output requiring milrinone. Echo 8/18 with EF 20-25%, moderate MR. CPX (8/18) with severe functional impairment due to HF. He is s/p HM3 LVAD placement. He had pump thrombosis 6/19 due to [facility] outflow graft kinking. He had pump exchange for another HM3. Not a candidate for transplant with PAD. Volume status managed at HD.  - Continue ASA 81.   - INR 1.6, heparin gtt + warfarin.  Will stop heparin gtt when INR back to 1.8.  Can stop heparin sooner if further surgical site bleeding.  3. ESRD: HD on M-W-F with midodrine pre-HD.   4.CAD: NSTEMI in  3/18. LHC with 99% ulcerated lesion proximal RCA with left to right collaterals, 95% mid LAD stenosis after mid LAD stent. s/p PCI to RCA and LAD on 11/25/16.No chest pain.  - Continue statin and ASA. 5. h/o DVTs: Factor V Leiden heterozygote. -Anticoagulated. 6. Diabetic gastroparesis: Long history of this.  Increased nausea post-op, may be related to increased narcotic use.  This is now improved.  - Continue reglan 10 mg tid/ac and Marinol.  - Continue Protonix bid.  -  He will continue erythromycinindefinitely.  - He can get low dose IV Ativan to help with nausea if needed.  - Limit narcotic use as much as possible. 7. HTN: Continue hydralazine 25 mg tid on non-HD days.  8. Pain: Improving, now off PCA.  9. ID: Chronic WBC elevation likely related to ischemic extremities. Abx expanded on 9/26 to vancomycin/meropenem, completed course on 10/2.  Afebrile. Blood cultures negative.  10. Anemia: post-op anemia + anemia of renal disease.  He got 1 unit PRBCs with HD on 9/30 and again on 10/2.  Hgb 7.9 today.  Surgical site oozing better, hopefully can hold off on further transfusion.   11. Continue to work with PT.  ?If he will be mobile enough to go home versus [facility] at discharge. Family trying to get a ramp at his house.  No PT here this weekend unfortunately, nurses to get up to chair.   Length of Stay: 110  Loralie Champagne, MD 06/09/2019, 7:46 AM  VAD Team --- VAD ISSUES ONLY--- Pager [phone] (7am - 7am)  Advanced Heart Failure Team  Pager [phone] (M-F; 7a - 4p)  Please contact [facility] for night-coverage after hours (4p -7a ) and weekends on [email or web address]

## 2019-06-09 NOTE — Plan of Care (Signed)
  Problem: Clinical Measurements: Goal: Ability to maintain clinical measurements within normal limits will improve Outcome: Progressing   Problem: Clinical Measurements: Goal: Will remain free from infection Outcome: Progressing   Problem: Clinical Measurements: Goal: Diagnostic test results will improve Outcome: Progressing   Problem: Activity: Goal: Risk for activity intolerance will decrease Outcome: Progressing

## 2019-06-10 LAB — BASIC METABOLIC PANEL
Anion gap: 14 (ref 5–15)
BUN: 60 mg/dL — ABNORMAL HIGH (ref 6–20)
CO2: 22 mmol/L (ref 22–32)
Calcium: 8.7 mg/dL — ABNORMAL LOW (ref 8.9–10.3)
Chloride: 95 mmol/L — ABNORMAL LOW (ref 98–111)
Creatinine, Ser: 9.52 mg/dL — ABNORMAL HIGH (ref 0.61–1.24)
GFR calc Af Amer: 6 mL/min — ABNORMAL LOW (ref >60)
GFR calc non Af Amer: 6 mL/min — ABNORMAL LOW (ref >60)
Glucose, Bld: 93 mg/dL (ref 70–99)
Potassium: 4.4 mmol/L (ref 3.5–5.1)
Sodium: 131 mmol/L — ABNORMAL LOW (ref 135–145)

## 2019-06-10 LAB — PROTIME-INR
INR: 1.9 — ABNORMAL HIGH (ref 0.8–1.2)
Prothrombin Time: 21.4 seconds — ABNORMAL HIGH (ref 11.4–15.2)

## 2019-06-10 LAB — CBC
HCT: 25.9 % — ABNORMAL LOW (ref 39.0–52.0)
Hemoglobin: 8 g/dL — ABNORMAL LOW (ref 13.0–17.0)
MCH: 27.5 pg (ref 26.0–34.0)
MCHC: 30.9 g/dL (ref 30.0–36.0)
MCV: 89 fL (ref 80.0–100.0)
Platelets: 375 10*3/uL (ref 150–400)
RBC: 2.91 MIL/uL — ABNORMAL LOW (ref 4.22–5.81)
RDW: 18.7 % — ABNORMAL HIGH (ref 11.5–15.5)
WBC: 23.4 10*3/uL — ABNORMAL HIGH (ref 4.0–10.5)
nRBC: 0.1 % (ref 0.0–0.2)

## 2019-06-10 LAB — GLUCOSE, CAPILLARY
Glucose-Capillary: 81 mg/dL (ref 70–99)
Glucose-Capillary: 78 mg/dL (ref 70–99)
Glucose-Capillary: 175 mg/dL — ABNORMAL HIGH (ref 70–99)

## 2019-06-10 LAB — LACTATE DEHYDROGENASE: LDH: 167 U/L (ref 98–192)

## 2019-06-10 LAB — HEPARIN LEVEL (UNFRACTIONATED): Heparin Unfractionated: 0.41 IU/mL (ref 0.30–0.70)

## 2019-06-10 MED ORDER — FENTANYL CITRATE (PF) 100 MCG/2ML IJ SOLN: 25.0000 ug | Freq: Three times a day (TID) | INTRAMUSCULAR | Status: DC | PRN

## 2019-06-10 MED ORDER — HEPARIN SODIUM (PORCINE) 1000 UNIT/ML IJ SOLN
INTRAMUSCULAR | Status: AC
  Filled 2019-06-10: qty 4

## 2019-06-10 MED ORDER — DOXERCALCIFEROL 4 MCG/2ML IV SOLN
INTRAVENOUS | Status: AC
  Filled 2019-06-10: qty 2

## 2019-06-10 MED ORDER — WARFARIN SODIUM 5 MG PO TABS
5.0000 mg | ORAL_TABLET | Freq: Once | ORAL | Status: AC
  Administered 2019-06-10: 5 mg via ORAL
  Filled 2019-06-10: qty 1

## 2019-06-10 MED ORDER — LORAZEPAM 2 MG/ML IJ SOLN: 0.2500 mg | Freq: Four times a day (QID) | INTRAMUSCULAR | Status: DC | PRN

## 2019-06-10 NOTE — Progress Notes (Signed)
LVAD Coordinator Rounding Note:  Admitted 05/28/19 per Dr. Aundra Dubin for heparin bridge for left toe amputation.    HM III LVAD implanted on 06/12/17 by Dr. Darcey Nora under Destination Therapy criteria. Pump exchange performed on 03/01/18 per Dr. Darcey Nora for pump thrombus.   Pt is asleep in the bed, getting ready to go to dialysis. Pt is still receiving IV Fentanyl every 6 hours for pain.  Vital signs: Temp: 98.7 HR: 78 Doppler Pressure: 100 Automatic BP: 104/87 (94) O2 Sat: 96% RA  Wt: 184>186.9>184.5>179.4>181.2>188 lbs   LVAD interrogation reveals:  Speed: 5500 Flow: 4.5 Power: 4.3w PI: 2.9 Alarms: none Events:  rare Hematocrit: 23  Fixed speed: 5500 Low speed limit: 5200   Drive Line: Dressing C/D/I; anchor intact and accurately applied. Weekly dressing changes per bedside nurse; next dressing change due 06/16/19.  Labs:  LDH trend: 233>435>686>168>372>902>111>552>080  INR trend: 2.5>2.3>1.9>1.8>1.7>1.7>1.7>2.0>2.1>1.9  Anticoagulation Plan: -INR Goal: 2.0 - 3.0 -ASA Dose: 81 mg daily   Device: St Jude dual ICD - Therapies: on 200 bpm - Last check: 01/01/18 - 2 hrs Afl/Afib; EMI 04/02/18  Blood: 06/05/19> 1 PRBC  Infection:  - BC x 2 on 05/30/19>>no growth x 5 days  Plan/Recommendations:  1. Call VAD pager if any questions about VAD equipment or drive line. 2. I will reach out to [facility] today to ensure they do not need VAD training.   [name] RN [place] Coordinator  Office: [phone]  24/7 Pager: [phone]

## 2019-06-10 NOTE — Progress Notes (Signed)
Physical Therapy Treatment Patient Details Name: [name] MRN: [id number] DOB: July 30, 1966 Today's Date: 06/10/2019    History of Present Illness Pt is a 53 y.o. male with hx of LVAD, ESRD, DM, CAD, HLD who is s/p right TMA on 04/30/2019 by Dr. Donzetta Matters for non healing amp site.  He had previously undergone ray amp of right 4th toes on 03/21/2019 by Dr. Donzetta Matters. He was seen 05/21/2019 by Dr. Aundra Dubin and was having difficulty walking due to pain in his right foot and is unable to put any weight on this foot. 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    PT Comments    Pt needed encouragement to participate post HD.  Emphasis on unassisted transition to EOB/scooting, sitting balance, sit to stands, scoot transfers to [facility].    Follow Up Recommendations  LTACH;Supervision/Assistance - 24 hour;Other (comment)(If Ramp, may be able to get home with HHPT)     Equipment Recommendations  None recommended by PT    Recommendations for Other Services       Precautions / Restrictions Precautions Precautions: Fall Precaution Comments: LVAD Required Braces or Orthoses: Other Brace Other Brace: Darco shoe-- left foot Restrictions Other Position/Activity Restrictions:  pt to weight bear on left heel with Darco shoe    Mobility  Bed Mobility Overal bed mobility: Needs Assistance Bed Mobility: Sit to Supine     Supine to sit: Min guard Sit to supine: Min assist   General bed mobility comments: increased time to come up and to EOB.  pt slow and deliberate trying to keep R residual limb off the bed.  Transfers Overall transfer level: Needs assistance Equipment used: Rolling walker (2 wheeled) Transfers: Sit to/from Stand;Lateral/Scoot Transfers Sit to Stand: From elevated surface;Max assist;+2 physical assistance        Lateral/Scoot Transfers: Mod assist;+2 physical assistance General transfer comment: pt much weaker post HD.  Pt needed more cuing for proper set up  for transfers  Ambulation/Gait             General Gait Details: unable   Stairs             Wheelchair Mobility    Modified Rankin (Stroke Patients Only)       Balance Overall balance assessment: Needs assistance   Sitting balance-[name]: Fair     Standing balance support: During functional activity Standing balance-[name]: Poor Standing balance comment: Reliant on physical A and UE support.                            Cognition Arousal/Alertness: Awake/alert Behavior During Therapy: WFL for tasks assessed/performed Overall Cognitive Status: Within Functional Limits for tasks assessed(NT formally)                                        Exercises      General Comments        Pertinent Vitals/Pain Faces Pain Scale: Hurts even more Pain Location: right residual limb and L foot Pain Descriptors / Indicators: Sore;Discomfort Pain Intervention(s): Monitored during session    Home Living                      Prior Function            PT Goals (current goals can now be found in  the care plan section) Acute Rehab PT Goals Patient Stated Goal: to go home  PT Goal Formulation: With patient Time For Goal Achievement: 06/17/19 Potential to Achieve Goals: Good Progress towards PT goals: Progressing toward goals    Frequency    Min 3X/week      PT Plan Current plan remains appropriate    Co-evaluation              AM-PAC PT "6 Clicks" Mobility   Outcome Measure  Help needed turning from your back to your side while in a flat bed without using bedrails?: None Help needed moving from lying on your back to sitting on the side of a flat bed without using bedrails?: None Help needed moving to and from a bed to a chair (including a wheelchair)?: A Little Help needed standing up from a chair using your arms (e.g., wheelchair or bedside chair)?: Total Help needed to walk in hospital room?:  Total Help needed climbing 3-5 steps with a railing? : Total 6 Click Score: 14    End of Session   Activity Tolerance: Patient limited by fatigue;Patient limited by pain Patient left: in bed;with call bell/phone within reach;with family/visitor present Nurse Communication: Mobility status PT Visit Diagnosis: Other abnormalities of gait and mobility (R26.89);Pain;Unsteadiness on feet (R26.81) Pain - Right/Left: Left     Time: 8138-8719 PT Time Calculation (min) (ACUTE ONLY): 22 min  Charges:  $Therapeutic Activity: 8-22 mins                     06/10/2019  Donnella Sham, PT Acute Rehabilitation Services [phone]  (pager) [phone]  (office)   Tessie Fass Isamu Trammel 06/10/2019, 5:52 PM

## 2019-06-10 NOTE — Progress Notes (Signed)
ANTICOAGULATION CONSULT NOTE - Follow Up Consult  Pharmacy Consult for heparin + warfarin Indication: LVAD HM3  Allergies  Allergen Reactions  . Metformin And Related Diarrhea    Patient Measurements: Height: 5' 7.99" (172.7 cm) Weight: 191 lb 2.2 oz (86.7 kg) IBW/kg (Calculated) : 68.38   Vital Signs: Temp: 98.6 F (37 C) (10/05 0743) Temp Source: Oral (10/05 0743) BP: 104/87 (10/05 0743) Pulse Rate: 77 (10/04 2318)  Labs: Recent Labs    06/08/19 0426 06/08/19 0427 06/09/19 0448 06/10/19 0500  HGB 8.4*  --  7.9* 8.0*  HCT 27.5*  --  25.3* 25.9*  PLT 386  --  363 375  LABPROT 17.6*  --  18.4* 21.4*  INR 1.5*  --  1.6* 1.9*  HEPARINUNFRC  --  0.29* 0.17* 0.41  CREATININE 5.50*  --  7.37* 9.52*    Estimated Creatinine Clearance: 9.5 mL/min (A) (by C-G formula based on SCr of 9.52 mg/dL (H)).   Medical History: Past Medical History:  Diagnosis Date  . Angina   . ASCVD (arteriosclerotic cardiovascular disease)    , Anterior infarction 2005, LAD diagonal bifurcation intervention 03/2004  . Automatic implantable cardiac defibrillator -St. Jude's       . Benign neoplasm of colon   . CHF (congestive heart failure) ([place])   . Chronic systolic heart failure ([place])   . Coronary artery disease     Widely patent previously placed stents in the left anterior   . Crohn's disease ([place])   . Deep venous thrombosis ([facility])    Recurrent-on Coumadin  . Dialysis patient ([place])   . Dyspnea   . Gastroparesis   . GERD (gastroesophageal reflux disease)   . High cholesterol   . Hyperlipidemia   . Hypersomnolent    Previous diagnosis of narcolepsy  . Hypertension, essential   . Ischemic cardiomyopathy    Ejection fraction 15-20% catheterization 2010  . Type II or unspecified type diabetes mellitus without mention of complication, not stated as uncontrolled   . Unspecified gastritis and gastroduodenitis without mention of hemorrhage     Assessment: 52 yoM on warfarin PTA for  LVAD and hx FV Leiden admitted for ongoing foot ischemia. 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 Pharmacy dosing heparin and warfarin  -INR= 1.9, heparin level therapeutic at 0.41 No further oozing from stump, although pt very nauseous today and experiencing episodes of emesis PTA warfarin 7.63m Tu/Fr 572mQOD  Goal of Therapy:  INR 2-3  Heparin Level 0.3-0.5 Monitor platelets by anticoagulation protocol: Yes   Plan:  Warfarin 5 mg x 1 tonight given GI issues today Stop heparin drip now that INR > 1.8 Daily heparin level, CBC and INR.  MeVertis KelchPharmD PGY2 Cardiology Pharmacy Resident Phone ([phone]/01/2019       7:48 AM  Please check [email or web address] for unit-specific pharmacist phone numbers

## 2019-06-10 NOTE — Progress Notes (Signed)
PT Cancellation Note  Patient Details Name: COURT GRACIA MRN: [id number] DOB: February 08, 1966   Cancelled Treatment:    Reason Eval/Treat Not Completed: Patient at procedure or test/unavailable.  As of this note, pt still in HD.  Will try back as able this p.m. 06/10/2019  Donnella Sham, PT Acute Rehabilitation Services [phone]  (pager) [phone]  (office)   Tessie Fass Heidemarie Goodnow 06/10/2019, 2:19 PM

## 2019-06-10 NOTE — TOC Progression Note (Addendum)
Transition of Care [facility]) - Progression Note    Patient Details  Name: KAYON DOZIER MRN: [id number] Date of Birth: May 08, 1966  Transition of Care [facility]) CM/SW Contact  Zenon Mayo, RN Phone Number: 06/10/2019, 12:29 PM  Clinical Narrative:    NCM spoke with Ander Purpura at [facility], she states they may have a HD bed coming open , they are also waiting on auth from insurance.  NCM informed Kennyth Lose CSW, informed Patient and his daughter, Margreta Journey.  Lauren with [facility] will be contacting the daughter.   Expected Discharge Plan: [place] Barriers to Discharge: No SNF bed, Continued Medical Work up(still in search of LVAD and Dialysis patient SNF bed)  Expected Discharge Plan and Services Expected Discharge Plan: [place] Choice: [place] arrangements for the past 2 months: Single Family Home                                       Social Determinants of Health (SDOH) Interventions    Readmission Risk Interventions Readmission Risk Prevention Plan 03/20/2019  Transportation Screening Complete  Medication Review Press photographer) Complete  HRI or Early Complete  SW Recovery Care/Counseling Consult Complete  [place] Not Applicable  Some recent data might be hidden

## 2019-06-10 NOTE — Plan of Care (Signed)
  Problem: Health Behavior/Discharge Planning: Goal: Ability to manage health-related needs will improve Outcome: Progressing   Problem: Clinical Measurements: Goal: Will remain free from infection Outcome: Progressing Goal: Diagnostic test results will improve Outcome: Progressing   Problem: Activity: Goal: Risk for activity intolerance will decrease Outcome: Progressing   Problem: Safety: Goal: Ability to remain free from injury will improve Outcome: Progressing   Problem: Education: Goal: Patient will understand all VAD equipment and how it functions Outcome: Progressing Goal: Patient will be able to verbalize current INR target range and antiplatelet therapy for discharge home Outcome: Progressing   Problem: Cardiac: Goal: LVAD will function as expected and patient will experience no clinical alarms Outcome: Progressing   Problem: Activity: Goal: Ability to perform//tolerate increased activity and mobilize with assistive devices will improve Outcome: Progressing

## 2019-06-10 NOTE — Progress Notes (Signed)
[facility] Progress Note   Assessment/ Plan:   OP Dialysis:[facility] MWF 4h 49mn 400/800 84kg 2K/3Ca Hep none TDC Venofer 1056mIV x 5 until 9/28 Mircera 200 mcg IV q 2 weeks (last 9/16)  Hectorol 49m10mIV TIW  Problem/Plan: # PAD bilateral LE's: SP L great toe amp on 9/23. 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 Per [facility].   # ESRD -HD MWF.On schedule today 10/5 K 3.9 /. Pt requests 1st shift.  # Hypertension/volume - No volume excess on exam.UF 2 L  Last hd  And in am /Appetite okay now . Under prior dry wtpost AKA.   # Anemia- HGB 7.9 ,last outpt Mircera on 9/16.Got darbe 200 ug here on 9/30and ordered weekly wed hd . Rec'd 1 unit prbc's 9/30 and to get 2nd prbc 10/2.   # Metabolic bone disease -Continue ca , ca corec= 10.4 phos 7.8 , VDRA/binders. / decr hec with Corec. Ca >10 ,on renvela  Reck phos pre hd   # CAD/ICM s/p LVAD - per HF team   # Dispo: unclear at present, perhaps to [facility]?    Subjective:    Nausea and vomiting overnight.  For HD today.     Objective:   BP 104/87 (BP Location: Left Arm)   Pulse 77   Temp 98.6 F (37 C) (Oral)   Resp 11   Ht 5' 7.99" (1.727 m)   Wt 86.7 kg   SpO2 98%   BMI 29.07 kg/m   Physical Exam: Gen: appears nauseated, emesis bag at bedside CVS: mechanical hum heard throughout the precordium Resp: RRR  Abd: obese Ext: s/p R AKA, L great toe amp  Labs: BMET Recent Labs  Lab 06/05/19 1414 06/07/19 0421 06/08/19 0426 06/09/19 0448 06/10/19 0500  NA 133* 134* 132* 133* 131*  K 4.4 4.2 3.8 3.9 4.4  CL 96* 98 96* 98 95*  CO2 23 25 23 23 22   GLUCOSE 177* 131* 183* 114* 93  BUN 52* 38* 32* 50* 60*  CREATININE 9.58* 7.83* 5.50* 7.37* 9.52*  CALCIUM 8.3* 8.6* 8.5* 8.7* 8.7*  PHOS 7.8*  --   --   --   --    CBC Recent Labs  Lab 06/07/19 0421 06/08/19 0426 06/09/19 0448 06/10/19 0500  WBC 25.0* 28.4* 24.0* 23.4*  HGB 7.3* 8.4* 7.9* 8.0*  HCT 23.2* 27.5* 25.3*  25.9*  MCV 88.2 89.3 88.5 89.0  PLT 400 386 363 375    @IMGRELPRIORS @ Medications:    . sodium chloride   Intravenous Once  . sodium chloride   Intravenous Once  . aspirin EC  81 mg Oral Daily  . atorvastatin  40 mg Oral q1800  . Chlorhexidine Gluconate Cloth  6 each Topical Daily  . Chlorhexidine Gluconate Cloth  6 each Topical Q0600  . Chlorhexidine Gluconate Cloth  6 each Topical Q0600  . citalopram  20 mg Oral Daily  . darbepoetin (ARANESP) injection - DIALYSIS  200 mcg Intravenous Q Wed-HD  . doxercalciferol  1 mcg Intravenous Q M,W,F-HD  . dronabinol  2.5 mg Oral BID AC  . erythromycin  250 mg Oral TID  . gabapentin  300 mg Oral BID  . hydrALAZINE  25 mg Oral 3 times per day on Sun Tue Thu Sat  . influenza vac split quadrivalent PF  0.5 mL Intramuscular Tomorrow-1000  . insulin aspart  0-9 Units Subcutaneous TID WC  . insulin glargine  10 Units Subcutaneous Daily  . metoCLOPramide  10 mg Oral  TID WC  . midodrine  5 mg Oral Q M,W,F  . multivitamin  1 tablet Oral QHS  . pantoprazole  40 mg Oral BID AC  . sevelamer carbonate  2,400 mg Oral TID WC  . warfarin  5 mg Oral ONCE-1800  . Warfarin - Pharmacist Dosing Inpatient   Does not apply q1800     Madelon Lips, MD 06/10/2019, 9:56 AM

## 2019-06-10 NOTE — Progress Notes (Addendum)
Patient ID: [name], male   DOB: Apr 27, 1966, 53 y.o.   MRN: [id number]    Advanced Heart Failure VAD Team Note  PCP-Cardiologist: No primary care provider on file.   Subjective:    9/27 S/P R AKA   MAP 90-100s   Complaining of nausea. Asking for phenergan and IV ativan.   LVAD INTERROGATION:  HeartMate 3 LVAD:   Flow 4.1  liters/min, speed 5500, power 4  PI 3.3    Objective:    Vital Signs:   Temp:  [97.8 F (36.6 C)-98.8 F (37.1 C)] 98.4 F (36.9 C) (10/05 0449) Pulse Rate:  [69-77] 77 (10/04 2318) Resp:  [9-16] 16 (10/05 0449) BP: (114-138)/(72-111) 115/93 (10/05 0500) SpO2:  [96 %-98 %] 98 % (10/04 2318) Weight:  [86.7 kg] 86.7 kg (10/05 0604) Last BM Date: 06/03/19 Mean arterial Pressure 90-100s   Intake/Output:   Intake/Output Summary (Last 24 hours) at 06/10/2019 0743 Last data filed at 06/10/2019 0500 Gross per 24 hour  Intake 1144.15 ml  Output 0 ml  Net 1144.15 ml     Physical Exam   Physical Exam: GENERAL: Vomiting . In Bed. NAD  HEENT: normal  NECK: Supple, JVP 5-6   .  2+ bilaterally, no bruits.  No lymphadenopathy or thyromegaly appreciated.   CARDIAC:  Mechanical heart sounds with LVAD hum present.  LUNGS:  Clear to auscultation bilaterally.  ABDOMEN:  Soft, round, nontender, positive bowel sounds x4.     LVAD exit site: well-healed and incorporated.  Dressing dry and intact.  No erythema or drainage.  Stabilization device present and accurately applied.  Driveline dressing is being changed daily per sterile technique. EXTREMITIES:  Warm and dry, no cyanosis, clubbing, rash or edema. RAKA staples approximated and no drainage.  L foot amputation site approximated. No drainage.   NEUROLOGIC:  Alert and oriented x 4.   No aphasia.  No dysarthria.  Affect pleasant.       Telemetry   NSR 80s personally reviewed.   Labs   Basic Metabolic Panel: Recent Labs  Lab 06/05/19 1414 06/07/19 0421 06/08/19 0426 06/09/19 0448 06/10/19 0500   NA 133* 134* 132* 133* 131*  K 4.4 4.2 3.8 3.9 4.4  CL 96* 98 96* 98 95*  CO2 23 25 23 23 22   GLUCOSE 177* 131* 183* 114* 93  BUN 52* 38* 32* 50* 60*  CREATININE 9.58* 7.83* 5.50* 7.37* 9.52*  CALCIUM 8.3* 8.6* 8.5* 8.7* 8.7*  PHOS 7.8*  --   --   --   --     Liver Function Tests: Recent Labs  Lab 06/05/19 1414  ALBUMIN 1.9*   No results for input(s): LIPASE, AMYLASE in the last 168 hours. No results for input(s): AMMONIA in the last 168 hours.  CBC: Recent Labs  Lab 06/06/19 0426 06/07/19 0421 06/08/19 0426 06/09/19 0448 06/10/19 0500  WBC 24.0* 25.0* 28.4* 24.0* 23.4*  HGB 8.0* 7.3* 8.4* 7.9* 8.0*  HCT 25.1* 23.2* 27.5* 25.3* 25.9*  MCV 86.6 88.2 89.3 88.5 89.0  PLT 409* 400 386 363 375    INR: Recent Labs  Lab 06/06/19 0426 06/07/19 0421 06/08/19 0426 06/09/19 0448 06/10/19 0500  INR 1.8* 1.6* 1.5* 1.6* 1.9*    Other results:  EKG:    Imaging   No results found.   Medications:     Scheduled Medications: . sodium chloride   Intravenous Once  . sodium chloride   Intravenous Once  . aspirin EC  81 mg Oral Daily  .  atorvastatin  40 mg Oral q1800  . Chlorhexidine Gluconate Cloth  6 each Topical Daily  . Chlorhexidine Gluconate Cloth  6 each Topical Q0600  . Chlorhexidine Gluconate Cloth  6 each Topical Q0600  . citalopram  20 mg Oral Daily  . darbepoetin (ARANESP) injection - DIALYSIS  200 mcg Intravenous Q Wed-HD  . doxercalciferol  1 mcg Intravenous Q M,W,F-HD  . dronabinol  2.5 mg Oral BID AC  . erythromycin  250 mg Oral TID  . gabapentin  300 mg Oral BID  . hydrALAZINE  25 mg Oral 3 times per day on Sun Tue Thu Sat  . influenza vac split quadrivalent PF  0.5 mL Intramuscular Tomorrow-1000  . insulin aspart  0-9 Units Subcutaneous TID WC  . insulin glargine  10 Units Subcutaneous Daily  . metoCLOPramide  10 mg Oral TID WC  . midodrine  5 mg Oral Q M,W,F  . multivitamin  1 tablet Oral QHS  . pantoprazole  40 mg Oral BID AC  .  sevelamer carbonate  2,400 mg Oral TID WC  . Warfarin - Pharmacist Dosing Inpatient   Does not apply q1800    Infusions: . sodium chloride 10 mL/hr at 06/01/19 1200  . sodium chloride 10 mL/hr at 06/02/19 0707  . heparin 1,300 Units/hr (06/10/19 0500)    PRN Medications: sodium chloride, acetaminophen, docusate sodium, fentaNYL (SUBLIMAZE) injection, hydrALAZINE, LORazepam, oxyCODONE, promethazine, promethazine   Assessment/Plan:    1. 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  Pain at right thigh surgical site improving, off PCA.  - No bleeding from incisions.  - INR 1.9. Stop heparin. On coumadin. Discussed personally with pharmacy.  - Concerned that he may eventually need further amputation on left, but no current plans.  L Foot looks ok with some ischemic changes around incision. Discussed with Dr Donzetta Matters last week. Plan to watch and see how things go. No additional plan for surgery.   2. Chronic systolic CHF: Ischemic cardiomyopathy. St Jude ICD. NSTEMI in March 2018 with DES to LAD and RCA, complicated by cardiogenic shock, low output requiring milrinone. Echo 8/18 with EF 20-25%, moderate MR. CPX (8/18) with severe functional impairment due to HF. He is s/p HM3 LVAD placement. He had pump thrombosis 6/19 due to [facility] outflow graft kinking. He had pump exchange for another HM3. Not a candidate for transplant with PAD. Volume status managed at HD.  - Continue ASA 81.   - INR 1.9 .Stop heparin drip.  3. ESRD: HD on M-W-F with midodrine pre-HD.   4.CAD: NSTEMI in 3/18. LHC with 99% ulcerated lesion proximal RCA with left to right collaterals, 95% mid LAD stenosis after mid LAD stent. s/p PCI to RCA and LAD on 11/25/16.No chest pain.  - Continue statin and ASA. 5. h/o DVTs: Factor V Leiden heterozygote. -Anticoagulated. 6. Diabetic gastroparesis: Long history of this.  Increased nausea post-op, may be related to increased  narcotic use.  This is now improved.  - Continue reglan 10 mg tid/ac and Marinol.  - Continue Protonix bid.  - He will continue erythromycinindefinitely.  - Will order low dose IV Ativan to help with nausea if needed.  - Limit narcotic use as much as possible. 7. HTN: Continue hydralazine 25 mg tid on non-HD days. Stable.  8. Pain: Improving, now off PCA.  9. ID: Chronic WBC elevation likely related to ischemic extremities. Abx expanded on 9/26 to vancomycin/meropenem, completed course on 10/2.  Blood cultures negative. WBC 23.4  10. Anemia: post-op anemia + anemia of renal disease.  He got 1 unit PRBCs with HD on 9/30 and again on 10/2.  Hgb 8  today.  No further bleeding noted.   11. Continue to work with PT.  ?If he will be mobile enough to go home versus [facility] LTACH at discharge. Family trying to get a ramp at his house.  He got up to chair yesterday.  I will reach out to case manager. Appropriate for LTAC but waiting on HD bed.   Length of Stay: [place], NP 06/10/2019, 7:43 AM  VAD Team --- VAD ISSUES ONLY--- Pager [phone] (7am - 7am)  Advanced Heart Failure Team  Pager [phone] (M-F; 7a - 4p)  Please contact [facility] for night-coverage after hours (4p -7a ) and weekends on [email or web address]  Patient seen with NP, agree with the above note.   Nauseated today, getting IV Ativan and Phenergan.  Was able to get up to chair over weekend.    WBCs trending down, off abx.    No further plan for left foot at this time per vascular surgery.    INR 1.9, can stop heparin gtt.  Right thigh surgical site no longer bleeding, hgb stable.   Need disposition plan. Will need to reach out to social work again, ?to [facility] (would need HD bed) versus home if family can get ramp and he is mobile enough.  Need PT to see him.   Iyan Flett 06/10/2019 8:42 AM

## 2019-06-11 LAB — CBC
HCT: 25.5 % — ABNORMAL LOW (ref 39.0–52.0)
Hemoglobin: 7.9 g/dL — ABNORMAL LOW (ref 13.0–17.0)
MCH: 27.6 pg (ref 26.0–34.0)
MCHC: 31 g/dL (ref 30.0–36.0)
MCV: 89.2 fL (ref 80.0–100.0)
Platelets: 321 10*3/uL (ref 150–400)
RBC: 2.86 MIL/uL — ABNORMAL LOW (ref 4.22–5.81)
RDW: 18.5 % — ABNORMAL HIGH (ref 11.5–15.5)
WBC: 15.3 10*3/uL — ABNORMAL HIGH (ref 4.0–10.5)
nRBC: 0.1 % (ref 0.0–0.2)

## 2019-06-11 LAB — GLUCOSE, CAPILLARY
Glucose-Capillary: 105 mg/dL — ABNORMAL HIGH (ref 70–99)
Glucose-Capillary: 165 mg/dL — ABNORMAL HIGH (ref 70–99)
Glucose-Capillary: 141 mg/dL — ABNORMAL HIGH (ref 70–99)
Glucose-Capillary: 94 mg/dL (ref 70–99)

## 2019-06-11 LAB — BASIC METABOLIC PANEL
Anion gap: 12 (ref 5–15)
BUN: 33 mg/dL — ABNORMAL HIGH (ref 6–20)
CO2: 25 mmol/L (ref 22–32)
Calcium: 8.4 mg/dL — ABNORMAL LOW (ref 8.9–10.3)
Chloride: 96 mmol/L — ABNORMAL LOW (ref 98–111)
Creatinine, Ser: 6.1 mg/dL — ABNORMAL HIGH (ref 0.61–1.24)
GFR calc Af Amer: 11 mL/min — ABNORMAL LOW (ref >60)
GFR calc non Af Amer: 10 mL/min — ABNORMAL LOW (ref >60)
Glucose, Bld: 101 mg/dL — ABNORMAL HIGH (ref 70–99)
Potassium: 3.9 mmol/L (ref 3.5–5.1)
Sodium: 133 mmol/L — ABNORMAL LOW (ref 135–145)

## 2019-06-11 LAB — PROTIME-INR
INR: 2.4 — ABNORMAL HIGH (ref 0.8–1.2)
Prothrombin Time: 25.9 seconds — ABNORMAL HIGH (ref 11.4–15.2)

## 2019-06-11 LAB — LACTATE DEHYDROGENASE: LDH: 159 U/L (ref 98–192)

## 2019-06-11 MED ORDER — WARFARIN SODIUM 5 MG PO TABS
5.0000 mg | ORAL_TABLET | Freq: Once | ORAL | Status: AC
  Administered 2019-06-11: 16:00:00 5 mg via ORAL
  Filled 2019-06-11: qty 1

## 2019-06-11 NOTE — Progress Notes (Signed)
[facility] Progress Note   Assessment/ Plan:   OP Dialysis:[facility] MWF 4h 30mn 400/800 84kg 2K/3Ca Hep none TDC Venofer 1019mIV x 5 until 9/28 Mircera 200 mcg IV q 2 weeks (last 9/16)  Hectorol 45m79mIV TIW  Problem/Plan: # PAD bilateral LE's: SP L great toe amp on 9/23. 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 Per [facility].  Bloody drainage resolved, close to d/c.  Off antibiotics at this time, WBC down, blood cultures ordered for today d/t low-grade fever of 100.8  # ESRD -HD MWF.On schedule 10/7.  Pt requests 1st shift.  # Hypertension/volume - No volume excess on exam.Will need new EDW post amputation.   # Anemia- HGB 7.9 ,last outpt Mircera on 9/16.Got darbe 200 ug here on 9/30and ordered weekly wed hd . Rec'd 1 unit prbc's 9/30 and to get 2nd prbc 10/2.   # Metabolic bone disease -Continue ca , ca corec= 10.4 phos 7.8 , VDRA/binders. / decr hec with Corec. Ca >10 ,on renvela  Reck phos pre hd   # CAD/ICM s/p LVAD - per HF team   # Dispo: [facility]- have notified dialysis nurse manager there to look out for the admissions referral  Subjective:    Tolerated HD well yesterday, febrile to 100.8, yesterday, blood cultures ordered for today.  D/c target [facility].  WBC down today at 15.3   Objective:   BP 105/81 (BP Location: Left Arm)   Pulse 82   Temp 98.6 F (37 C) (Axillary)   Resp 16   Ht 5' 7.99" (1.727 m)   Wt 83.4 kg   SpO2 98%   BMI 27.96 kg/m   Physical Exam: Gen: NAD, appears well CVS: mechanical hum heard throughout the precordium Resp: RRR  Abd: obese Ext: s/p R AKA, L great toe amp, dusky edges ACCESS: L IJ [facility]: BMET Recent Labs  Lab 06/05/19 1414 06/07/19 0421 06/08/19 0426 06/09/19 0448 06/10/19 0500 06/11/19 0446  NA 133* 134* 132* 133* 131* 133*  K 4.4 4.2 3.8 3.9 4.4 3.9  CL 96* 98 96* 98 95* 96*  CO2 23 25 23 23 22 25   GLUCOSE 177* 131* 183* 114* 93 101*  BUN 52* 38* 32* 50* 60* 33*   CREATININE 9.58* 7.83* 5.50* 7.37* 9.52* 6.10*  CALCIUM 8.3* 8.6* 8.5* 8.7* 8.7* 8.4*  PHOS 7.8*  --   --   --   --   --    CBC Recent Labs  Lab 06/08/19 0426 06/09/19 0448 06/10/19 0500 06/11/19 0446  WBC 28.4* 24.0* 23.4* 15.3*  HGB 8.4* 7.9* 8.0* 7.9*  HCT 27.5* 25.3* 25.9* 25.5*  MCV 89.3 88.5 89.0 89.2  PLT 386 363 375 321    @IMGRELPRIORS @ Medications:    . sodium chloride   Intravenous Once  . sodium chloride   Intravenous Once  . aspirin EC  81 mg Oral Daily  . atorvastatin  40 mg Oral q1800  . Chlorhexidine Gluconate Cloth  6 each Topical Daily  . Chlorhexidine Gluconate Cloth  6 each Topical Q0600  . Chlorhexidine Gluconate Cloth  6 each Topical Q0600  . citalopram  20 mg Oral Daily  . darbepoetin (ARANESP) injection - DIALYSIS  200 mcg Intravenous Q Wed-HD  . doxercalciferol  1 mcg Intravenous Q M,W,F-HD  . dronabinol  2.5 mg Oral BID AC  . erythromycin  250 mg Oral TID  . gabapentin  300 mg Oral BID  . hydrALAZINE  25 mg Oral 3 times per day  on Sun Tue Thu Sat  . influenza vac split quadrivalent PF  0.5 mL Intramuscular Tomorrow-1000  . insulin aspart  0-9 Units Subcutaneous TID WC  . insulin glargine  10 Units Subcutaneous Daily  . metoCLOPramide  10 mg Oral TID WC  . midodrine  5 mg Oral Q M,W,F  . multivitamin  1 tablet Oral QHS  . pantoprazole  40 mg Oral BID AC  . sevelamer carbonate  2,400 mg Oral TID WC  . Warfarin - Pharmacist Dosing Inpatient   Does not apply q1800     Madelon Lips, MD 06/11/2019, 10:22 AM

## 2019-06-11 NOTE — TOC Progression Note (Addendum)
Transition of Care [facility]) - Progression Note    Patient Details  Name: ARLENE GENOVA MRN: [id number] Date of Birth: [birth year]  Transition [facility]) CM/SW Contact  Zenon Mayo, RN Phone Number: 06/11/2019, 3:38 PM  Clinical Narrative:    NCM received call from [place] with [facility] she states they have received denial , if MD would like to do a peer to peer please give NCM the phone number where you would like them to reach you.  Information given to Amy NP and Kennyth Lose LVAD CSW.  Amy NP states Dr. Aundra Dubin phone is  [phone] for the peer to peer.  NCM gave this information to [place] at [place].    Expected Discharge Plan: [place] Barriers to Discharge: No SNF bed, Continued Medical Work up(still in search of LVAD and Dialysis patient SNF bed)  Expected Discharge Plan and Services Expected Discharge Plan: [place] Choice: Golf arrangements for the past 2 months: Single Family Home                                       Social Determinants of Health (SDOH) Interventions    Readmission Risk Interventions Readmission Risk Prevention Plan 03/20/2019  Transportation Screening Complete  Medication Review Press photographer) Complete  HRI or [place] Complete  SW Recovery Care/Counseling Consult Complete  [place] Not Applicable  Some recent data might be hidden

## 2019-06-11 NOTE — Progress Notes (Signed)
CSW attempted to visit patient at bedside although patient was at dialysis. CSW informed that referral to LTAC was in process and awaiting insurance authorization. CSW available as needed for supportive intervention. Raquel Sarna, [place], [place]

## 2019-06-11 NOTE — Progress Notes (Signed)
LVAD Coordinator Rounding Note:  Admitted 05/28/19 per Dr. Aundra Dubin for heparin bridge for left toe amputation.    HM III LVAD implanted on 06/12/17 by Dr. Darcey Nora under Destination Therapy criteria. Pump exchange performed on 03/01/18 per Dr. Darcey Nora for pump thrombus.    Vital signs: Temp: 98.3 HR: 78 Doppler Pressure: 88 Automatic BP: 100/72 (82) O2 Sat: 99% RA  Wt: 184>186.9>184.5>179.4>181.2>188>183.8 lbs   LVAD interrogation reveals:  Speed: 5500 Flow: 4.1 Power: 4.3w PI: 3.3 Alarms: none Events:  rare Hematocrit: 23  Fixed speed: 5500 Low speed limit: 5200   Drive Line: Dressing C/D/I; anchor intact and accurately applied. Weekly dressing changes per bedside nurse; next dressing change due 06/16/19.  Labs:  LDH trend: 200>186>156>159>174>155>158>164>167>159  INR trend: 2.5>2.3>1.9>1.8>1.7>1.7>1.7>2.0>2.1>1.9>2.4  Anticoagulation Plan: -INR Goal: 2.0 - 3.0 -ASA Dose: 81 mg daily   Device: St Jude dual ICD - Therapies: on 200 bpm - Last check: 01/01/18 - 2 hrs Afl/Afib; EMI 04/02/18  Blood: 06/05/19> 1 PRBC  Infection:  - BC x 2 on 05/30/19>>no growth x 5 days  Plan/Recommendations:  1. Call VAD pager if any questions about VAD equipment or drive line.  [name] RN [place] Coordinator  Office: [phone]  24/7 Pager: [phone]

## 2019-06-11 NOTE — Progress Notes (Signed)
Patient ID: [name], male   DOB: 09-24-1965, 53 y.o.   MRN: [id number]    Advanced Heart Failure VAD Team Note  PCP-Cardiologist: No primary care provider on file.   Subjective:    9/27 S/P R AKA   MAP 90s-100s   Nausea improved.  Right thigh pain improved.  Low grade fever to 100.1 last night, currently afebrile.  WBCs trending down, 15K today.   LVAD INTERROGATION:  HeartMate 3 LVAD:   Flow 3.9  liters/min, speed 5500, power 4.3  PI 4.2.  No PI events.     Objective:    Vital Signs:   Temp:  [98.3 F (36.8 C)-100.1 F (37.8 C)] 98.6 F (37 C) (10/06 0736) Pulse Rate:  [66-96] 82 (10/06 0736) Resp:  [12-24] 16 (10/06 0736) BP: (88-131)/(48-99) 105/81 (10/06 0736) SpO2:  [96 %-99 %] 98 % (10/06 0736) Weight:  [83.3 kg-85.3 kg] 83.4 kg (10/06 0300) Last BM Date: 06/03/19 Mean arterial Pressure 80s-100s   Intake/Output:   Intake/Output Summary (Last 24 hours) at 06/11/2019 0920 Last data filed at 06/11/2019 0743 Gross per 24 hour  Intake 220 ml  Output 1500 ml  Net -1280 ml     Physical Exam   General: Well appearing this am. NAD.  HEENT: Normal. Neck: Supple, JVP 7-8 cm. Carotids OK.  Cardiac:  Mechanical heart sounds with LVAD hum present.  Lungs:  CTAB, normal effort.  Abdomen:  NT, ND, no HSM. No bruits or masses. +BS  LVAD exit site: Well-healed and incorporated. Dressing dry and intact. No erythema or drainage. Stabilization device present and accurately applied. Driveline dressing changed daily per sterile technique. Extremities:  S/p right AKA, s/p left great toe amputation.  Neuro:  Alert & oriented x 3. Cranial nerves grossly intact. Moves all 4 extremities w/o difficulty. Affect pleasant     Telemetry   NSR 80s personally reviewed.   Labs   Basic Metabolic Panel: Recent Labs  Lab 06/05/19 1414 06/07/19 0421 06/08/19 0426 06/09/19 0448 06/10/19 0500 06/11/19 0446  NA 133* 134* 132* 133* 131* 133*  K 4.4 4.2 3.8 3.9 4.4 3.9  CL  96* 98 96* 98 95* 96*  CO2 23 25 23 23 22 25   GLUCOSE 177* 131* 183* 114* 93 101*  BUN 52* 38* 32* 50* 60* 33*  CREATININE 9.58* 7.83* 5.50* 7.37* 9.52* 6.10*  CALCIUM 8.3* 8.6* 8.5* 8.7* 8.7* 8.4*  PHOS 7.8*  --   --   --   --   --     Liver Function Tests: Recent Labs  Lab 06/05/19 1414  ALBUMIN 1.9*   No results for input(s): LIPASE, AMYLASE in the last 168 hours. No results for input(s): AMMONIA in the last 168 hours.  CBC: Recent Labs  Lab 06/07/19 0421 06/08/19 0426 06/09/19 0448 06/10/19 0500 06/11/19 0446  WBC 25.0* 28.4* 24.0* 23.4* 15.3*  HGB 7.3* 8.4* 7.9* 8.0* 7.9*  HCT 23.2* 27.5* 25.3* 25.9* 25.5*  MCV 88.2 89.3 88.5 89.0 89.2  PLT 400 386 363 375 321    INR: Recent Labs  Lab 06/07/19 0421 06/08/19 0426 06/09/19 0448 06/10/19 0500 06/11/19 0446  INR 1.6* 1.5* 1.6* 1.9* 2.4*    Other results:  EKG:    Imaging   No results found.   Medications:     Scheduled Medications: . sodium chloride   Intravenous Once  . sodium chloride   Intravenous Once  . aspirin EC  81 mg Oral Daily  . atorvastatin  40 mg  Oral q1800  . Chlorhexidine Gluconate Cloth  6 each Topical Daily  . Chlorhexidine Gluconate Cloth  6 each Topical Q0600  . Chlorhexidine Gluconate Cloth  6 each Topical Q0600  . citalopram  20 mg Oral Daily  . darbepoetin (ARANESP) injection - DIALYSIS  200 mcg Intravenous Q Wed-HD  . doxercalciferol  1 mcg Intravenous Q M,W,F-HD  . dronabinol  2.5 mg Oral BID AC  . erythromycin  250 mg Oral TID  . gabapentin  300 mg Oral BID  . hydrALAZINE  25 mg Oral 3 times per day on Sun Tue Thu Sat  . influenza vac split quadrivalent PF  0.5 mL Intramuscular Tomorrow-1000  . insulin aspart  0-9 Units Subcutaneous TID WC  . insulin glargine  10 Units Subcutaneous Daily  . metoCLOPramide  10 mg Oral TID WC  . midodrine  5 mg Oral Q M,W,F  . multivitamin  1 tablet Oral QHS  . pantoprazole  40 mg Oral BID AC  . sevelamer carbonate  2,400 mg Oral  TID WC  . Warfarin - Pharmacist Dosing Inpatient   Does not apply q1800    Infusions: . sodium chloride 10 mL/hr at 06/01/19 1200  . sodium chloride 10 mL/hr at 06/02/19 0707    PRN Medications: sodium chloride, acetaminophen, docusate sodium, fentaNYL (SUBLIMAZE) injection, hydrALAZINE, LORazepam, LORazepam, oxyCODONE, promethazine, promethazine   Assessment/Plan:    1. 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  Pain at right thigh surgical site improving, off PCA.  No further incisional bleeding.  - Concerned that he may eventually need further amputation on left, but no current plans.  L Foot looks ok with some ischemic changes around incision. Vascular has seen, plan to watch and see how things go. No additional plan for surgery.   2. Chronic systolic CHF: Ischemic cardiomyopathy. St Jude ICD. NSTEMI in March 2018 with DES to LAD and RCA, complicated by cardiogenic shock, low output requiring milrinone. Echo 8/18 with EF 20-25%, moderate MR. CPX (8/18) with severe functional impairment due to HF. He is s/p HM3 LVAD placement. He had pump thrombosis 6/19 due to [facility] outflow graft kinking. He had pump exchange for another HM3. Not a candidate for transplant with PAD. Volume status managed at HD. INR 2.4 today.  - Continue ASA 81.   - Continue warfarin.   3. ESRD: HD on M-W-F with midodrine pre-HD.   4.CAD: NSTEMI in 3/18. LHC with 99% ulcerated lesion proximal RCA with left to right collaterals, 95% mid LAD stenosis after mid LAD stent. s/p PCI to RCA and LAD on 11/25/16.No chest pain.  - Continue statin and ASA. 5. h/o DVTs: Factor V Leiden heterozygote. -Anticoagulated. 6. Diabetic gastroparesis: Long history of this.  Increased nausea post-op, may be related to increased narcotic use. This is now improved.  - Continue reglan 10 mg tid/ac and Marinol.  - Continue Protonix bid.  - He will continue erythromycinindefinitely.   - Will order low dose IV Ativan to help with nausea if needed.  - Limit narcotic use as much as possible. 7. HTN: Continue hydralazine 25 mg tid on non-HD days. Stable.  8. Pain: Improving, now off PCA.  - Decrease pain meds.  9. ID: Chronic WBC elevation likely related to ischemic extremities. Abx expanded on 9/26 to vancomycin/meropenem, completed course on 10/2.  Blood cultures negative. WBCs trending down but had low grade fever overnight at 100.1.  - Will send blood cultures.  10.  Anemia: post-op anemia + anemia of renal disease.  He got 1 unit PRBCs with HD on 9/30 and again on 10/2.  Hgb 7.9 today, stable.  No further bleeding noted at surgical site.   11. Disposition: Continue to work with PT.  For now, aiming for him to go to [facility].  May be able to go home relatively soon once ramp built at his house.  Would hold today with low grade fever, if no further probably can discharge tomorrow.   Length of Stay: [place], MD 06/11/2019, 9:20 AM  VAD Team --- VAD ISSUES ONLY--- Pager [phone] (7am - 7am)  Advanced Heart Failure Team  Pager [phone] (M-F; 7a - 4p)  Please contact [facility] for night-coverage after hours (4p -7a ) and weekends on [email or web address]

## 2019-06-11 NOTE — Progress Notes (Addendum)
  Progress Note    06/11/2019 8:07 AM 9 Days Post-Op  Subjective:  C/o soreness in his right stump but says it is a little better   Vitals:   06/11/19 0400 06/11/19 0736  BP: (!) 112/99 105/81  Pulse:  82  Resp:  16  Temp:  98.6 F (37 C)  SpO2:  98%    Physical Exam: Cardiac:  regular Lungs:  Non labored Incisions:  Right AKA incision is clean and dry with staples in tact.  Bloody drainage resolved.  Left great toe amp still with darkened edges but not much different than the last time I saw him.  He does have a small superficial sore on the 4th toe.  Extremities:  Brisk left peroneal doppler signal present.   CBC    Component Value Date/Time   WBC 15.3 (H) 06/11/2019 0446   RBC 2.86 (L) 06/11/2019 0446   HGB 7.9 (L) 06/11/2019 0446   HGB 11.7 (L) 09/07/2016 1457   HCT 25.5 (L) 06/11/2019 0446   HCT 37.5 09/07/2016 1457   PLT 321 06/11/2019 0446   PLT 279 09/07/2016 1457   MCV 89.2 06/11/2019 0446   MCV 81 09/07/2016 1457   MCH 27.6 06/11/2019 0446   MCHC 31.0 06/11/2019 0446   RDW 18.5 (H) 06/11/2019 0446   RDW 14.9 09/07/2016 1457   LYMPHSABS 1.0 05/28/2019 1055   LYMPHSABS 1.3 09/07/2016 1457   MONOABS 1.5 (H) 05/28/2019 1055   EOSABS 0.4 05/28/2019 1055   EOSABS 0.2 09/07/2016 1457   BASOSABS 0.1 05/28/2019 1055   BASOSABS 0.0 09/07/2016 1457    BMET    Component Value Date/Time   NA 133 (L) 06/11/2019 0446   NA 144 05/11/2017 0938   K 3.9 06/11/2019 0446   CL 96 (L) 06/11/2019 0446   CO2 25 06/11/2019 0446   GLUCOSE 101 (H) 06/11/2019 0446   BUN 33 (H) 06/11/2019 0446   BUN 68 (H) 05/11/2017 0938   CREATININE 6.10 (H) 06/11/2019 0446   CREATININE 1.63 (H) 11/09/2016 1505   CALCIUM 8.4 (L) 06/11/2019 0446   CALCIUM 8.7 06/27/2017 1152   GFRNONAA 10 (L) 06/11/2019 0446   GFRNONAA 48 (L) 11/09/2016 1505   GFRAA 11 (L) 06/11/2019 0446   GFRAA 55 (L) 11/09/2016 1505    INR    Component Value Date/Time   INR 2.4 (H) 06/11/2019 0446      Intake/Output Summary (Last 24 hours) at 06/11/2019 0807 Last data filed at 06/11/2019 0743 Gross per 24 hour  Intake 220 ml  Output 1500 ml  Net -1280 ml     Assessment:  53 y.o. male is s/p:  Right AKA and left great toe amp  9 Days Post-Op  Plan: -right AKA healing nicely and bloody drainage resolved. -left great toe amp continues to have darkened edges, but not really much different than when I saw him previously.  Continue to monitor.  He does have a small superficial sore on the 4th toe.  Continue to monitor.  He remains at high risk for left leg amputation. -continues to have brisk left peroneal doppler signal.   Leontine Locket, PA-C [facility] [phone] 06/11/2019 8:07 AM   I have independently interviewed and examined patient and agree with PA assessment and plan above.   Jakayla Schweppe C. Donzetta Matters, MD [facility] [place] Office: [phone] Pager: [phone]

## 2019-06-11 NOTE — Progress Notes (Signed)
Physical Therapy Treatment Patient Details Name: [name] MRN: [id number] DOB: 09/04/1966 Today's Date: 06/11/2019    History of Present Illness Pt is a 53 y.o. male with hx of LVAD, ESRD, DM, CAD, HLD, s/p right TMA on 04/30/2019 for non-healing amputation site (previous R 4th ray amp 03/21/19); pt now admitted 05/28/19 with difficulty walking due to R foot pain. Now sp L great toe amputation 9/23. S/p R AKA 9/27.   PT Comments    Pt progressing with mobility. Attempted standing, pt able to anteriorly translate and offload weight with maxA+2, but unable to achieve fully upright from low recliner height. Able to perform lateral scoot to level surface height; curious to try transfer with use of sliding board next session. Pt able to talk therapist through switching LVAD from wall<>battery power (reports family typically assists at home due to L hand weakness). Continue to recommend post-acute therapies to maximize functional mobility and independence prior to return home; pt in agreement.    Follow Up Recommendations  LTACH;Supervision/Assistance - 24 hour     Equipment Recommendations  None recommended by PT    Recommendations for Other Services       Precautions / Restrictions Precautions Precautions: Fall;Other (comment) Precaution Comments: LVAD Restrictions Weight Bearing Restrictions: Yes LLE Weight Bearing: Partial weight bearing LLE Partial Weight Bearing Percentage or Pounds: L foot WB thru heel in darco shoe    Mobility  Bed Mobility Overal bed mobility: Needs Assistance Bed Mobility: Supine to Sit     Supine to sit: Min assist;HOB elevated     General bed mobility comments: MinA to assist trunk elevation, pt using bed rail to scoot hips to EOB while sidelying  Transfers Overall transfer level: Needs assistance Equipment used: Rolling walker (2 wheeled) Transfers: Sit to/from Stand;Lateral/Scoot Transfers Sit to Stand: Max assist;+2 physical assistance         Lateral/Scoot Transfers: Min guard General transfer comment: Lateral scoot transfer to drop arm recliner with assist for set-up and min guard, increased time and effort. Attempted standing from recliner; pt able to completely offload buttocks with maxA+2 for trunk elevation, but unable to achieve fully upright posture; limited by LLE WB thru heel only and R BKA; able to perform initial anterior weight translation well with cues  Ambulation/Gait                 Stairs             Wheelchair Mobility    Modified Rankin (Stroke Patients Only)       Balance Overall balance assessment: Needs assistance Sitting-balance support: No upper extremity supported;Feet supported Sitting balance-[name]: Fair       Standing balance-[name]: Zero                              Cognition Arousal/Alertness: Awake/alert Behavior During Therapy: WFL for tasks assessed/performed Overall Cognitive Status: Within Functional Limits for tasks assessed                                 General Comments: WFL for simple tasks; not formally assessed. Answering all questions related to LVAD function/parts/PMH appropriately      Exercises      General Comments        Pertinent Vitals/Pain Pain Assessment: Faces Faces Pain Scale: Hurts a little bit Pain Location: L foot in dependent position  Pain Descriptors / Indicators: Sore;Pounding Pain Intervention(s): Monitored during session;Repositioned    Home Living                      Prior Function            PT Goals (current goals can now be found in the care plan section) Progress towards PT goals: Progressing toward goals    Frequency    Min 3X/week      PT Plan Current plan remains appropriate    Co-evaluation              AM-PAC PT "6 Clicks" Mobility   Outcome Measure  Help needed turning from your back to your side while in a flat bed without using  bedrails?: A Little Help needed moving from lying on your back to sitting on the side of a flat bed without using bedrails?: A Little Help needed moving to and from a bed to a chair (including a wheelchair)?: A Little Help needed standing up from a chair using your arms (e.g., wheelchair or bedside chair)?: Total Help needed to walk in hospital room?: Total Help needed climbing 3-5 steps with a railing? : Total 6 Click Score: 12    End of Session Equipment Utilized During Treatment: Gait belt Activity Tolerance: Patient tolerated treatment well Patient left: in chair;with call bell/phone within reach;with chair alarm set Nurse Communication: Mobility status PT Visit Diagnosis: Other abnormalities of gait and mobility (R26.89);Pain;Unsteadiness on feet (R26.81) Pain - Right/Left: Left Pain - part of body: Ankle and joints of foot     Time: 6144-3154 PT Time Calculation (min) (ACUTE ONLY): 27 min  Charges:  $Therapeutic Activity: 23-37 mins                    Mabeline Caras, PT, DPT Acute Rehabilitation Services  Pager [phone] Office [phone]  Derry Lory 06/11/2019, 12:56 PM

## 2019-06-11 NOTE — Progress Notes (Signed)
ANTICOAGULATION CONSULT NOTE - Follow Up Consult  Pharmacy Consult for warfarin Indication: LVAD HM3  Allergies  Allergen Reactions  . Metformin And Related Diarrhea    Patient Measurements: Height: 5' 7.99" (172.7 cm) Weight: 183 lb 13.8 oz (83.4 kg) IBW/kg (Calculated) : 68.38   Vital Signs: Temp: 98.6 F (37 C) (10/06 0736) Temp Source: Axillary (10/06 0736) BP: 105/81 (10/06 0736) Pulse Rate: 82 (10/06 0736)  Labs: Recent Labs    06/09/19 0448 06/10/19 0500 06/11/19 0446  HGB 7.9* 8.0* 7.9*  HCT 25.3* 25.9* 25.5*  PLT 363 375 321  LABPROT 18.4* 21.4* 25.9*  INR 1.6* 1.9* 2.4*  HEPARINUNFRC 0.17* 0.41  --   CREATININE 7.37* 9.52* 6.10*    Estimated Creatinine Clearance: 14.6 mL/min (A) (by C-G formula based on SCr of 6.1 mg/dL (H)).   Medical History: Past Medical History:  Diagnosis Date  . Angina   . ASCVD (arteriosclerotic cardiovascular disease)    , Anterior infarction 2005, LAD diagonal bifurcation intervention 03/2004  . Automatic implantable cardiac defibrillator -St. Jude's       . Benign neoplasm of colon   . CHF (congestive heart failure) ([place])   . Chronic systolic heart failure ([place])   . Coronary artery disease     Widely patent previously placed stents in the left anterior   . Crohn's disease ([place])   . Deep venous thrombosis ([facility])    Recurrent-on Coumadin  . Dialysis patient ([place])   . Dyspnea   . Gastroparesis   . GERD (gastroesophageal reflux disease)   . High cholesterol   . Hyperlipidemia   . Hypersomnolent    Previous diagnosis of narcolepsy  . Hypertension, essential   . Ischemic cardiomyopathy    Ejection fraction 15-20% catheterization 2010  . Type II or unspecified type diabetes mellitus without mention of complication, not stated as uncontrolled   . Unspecified gastritis and gastroduodenitis without mention of hemorrhage     Assessment: [name] on warfarin PTA for LVAD and hx FV Leiden admitted for ongoing foot ischemia.  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 Pharmacy dosing warfarin. Heparin off 10/5.  -INR 2.4, CBC stable Nausea/emesis from yesterday has resolved. Did not eat much today, but starting to eat more today.  PTA warfarin 7.40m Tu/Fr 519mQOD  Goal of Therapy:  INR 2-3  Monitor platelets by anticoagulation protocol: Yes   Plan:  Warfarin 5 mg x 1 again tonight Daily CBC and INR  MeVertis KelchPharmD PGY2 Cardiology Pharmacy Resident Phone ([phone]/02/2019       10:30 AM  Please check [email or web address] for unit-specific pharmacist phone numbers

## 2019-06-12 ENCOUNTER — Telehealth (HOSPITAL_COMMUNITY): Payer: Self-pay | Admitting: Licensed Clinical Social Worker

## 2019-06-12 LAB — CBC
HCT: 24.5 % — ABNORMAL LOW (ref 39.0–52.0)
Hemoglobin: 7.4 g/dL — ABNORMAL LOW (ref 13.0–17.0)
MCH: 27.3 pg (ref 26.0–34.0)
MCHC: 30.2 g/dL (ref 30.0–36.0)
MCV: 90.4 fL (ref 80.0–100.0)
Platelets: 333 10*3/uL (ref 150–400)
RBC: 2.71 MIL/uL — ABNORMAL LOW (ref 4.22–5.81)
RDW: 18 % — ABNORMAL HIGH (ref 11.5–15.5)
WBC: 14 10*3/uL — ABNORMAL HIGH (ref 4.0–10.5)
nRBC: 0 % (ref 0.0–0.2)

## 2019-06-12 LAB — GLUCOSE, CAPILLARY
Glucose-Capillary: 127 mg/dL — ABNORMAL HIGH (ref 70–99)
Glucose-Capillary: 191 mg/dL — ABNORMAL HIGH (ref 70–99)
Glucose-Capillary: 144 mg/dL — ABNORMAL HIGH (ref 70–99)
Glucose-Capillary: 166 mg/dL — ABNORMAL HIGH (ref 70–99)

## 2019-06-12 LAB — RENAL FUNCTION PANEL
Albumin: 1.9 g/dL — ABNORMAL LOW (ref 3.5–5.0)
Anion gap: 13 (ref 5–15)
BUN: 52 mg/dL — ABNORMAL HIGH (ref 6–20)
CO2: 21 mmol/L — ABNORMAL LOW (ref 22–32)
Calcium: 8.1 mg/dL — ABNORMAL LOW (ref 8.9–10.3)
Chloride: 95 mmol/L — ABNORMAL LOW (ref 98–111)
Creatinine, Ser: 8.39 mg/dL — ABNORMAL HIGH (ref 0.61–1.24)
GFR calc Af Amer: 8 mL/min — ABNORMAL LOW (ref >60)
GFR calc non Af Amer: 6 mL/min — ABNORMAL LOW (ref >60)
Glucose, Bld: 206 mg/dL — ABNORMAL HIGH (ref 70–99)
Phosphorus: 7.1 mg/dL — ABNORMAL HIGH (ref 2.5–4.6)
Potassium: 4.1 mmol/L (ref 3.5–5.1)
Sodium: 129 mmol/L — ABNORMAL LOW (ref 135–145)

## 2019-06-12 LAB — PHOSPHORUS: Phosphorus: 7.3 mg/dL — ABNORMAL HIGH (ref 2.5–4.6)

## 2019-06-12 LAB — LACTATE DEHYDROGENASE: LDH: 150 U/L (ref 98–192)

## 2019-06-12 LAB — PROTIME-INR
INR: 2.6 — ABNORMAL HIGH (ref 0.8–1.2)
Prothrombin Time: 27.4 seconds — ABNORMAL HIGH (ref 11.4–15.2)

## 2019-06-12 MED ORDER — WARFARIN SODIUM 5 MG PO TABS
5.0000 mg | ORAL_TABLET | ORAL | Status: DC
  Administered 2019-06-12 – 2019-06-15 (×3): 5 mg via ORAL
  Filled 2019-06-12 (×3): qty 1

## 2019-06-12 MED ORDER — DARBEPOETIN ALFA 200 MCG/0.4ML IJ SOSY
PREFILLED_SYRINGE | INTRAMUSCULAR | Status: AC
  Administered 2019-06-12: 17:00:00 200 ug via INTRAVENOUS
  Filled 2019-06-12: qty 0.4

## 2019-06-12 MED ORDER — SEVELAMER CARBONATE 0.8 G PO PACK
0.8000 g | PACK | Freq: Three times a day (TID) | ORAL | Status: DC
  Filled 2019-06-12 (×15): qty 1

## 2019-06-12 MED ORDER — DOXERCALCIFEROL 4 MCG/2ML IV SOLN
INTRAVENOUS | Status: AC
  Administered 2019-06-12: 1 ug via INTRAVENOUS
  Filled 2019-06-12: qty 2

## 2019-06-12 MED ORDER — WARFARIN SODIUM 7.5 MG PO TABS
7.5000 mg | ORAL_TABLET | ORAL | Status: DC
  Administered 2019-06-14: 17:00:00 7.5 mg via ORAL
  Filled 2019-06-12 (×2): qty 1

## 2019-06-12 NOTE — Progress Notes (Signed)
Peer to peer done for LTAC. Insurance still not approved. Pt with intermit fevers .... Dr. Aundra Dubin to f/u with insurance MD on tomorrow per [facility], Ander Purpura. Whitman Hero RN,BSN,CM

## 2019-06-12 NOTE — Progress Notes (Signed)
CSW met at bedside with patient who states he is doing "well" today. Patient scheduled for dialysis this afternoon and disappointment that he could not go this morning as he states he "does better in the AM". Patient reports he has been sporadically using the journal and has not found a correlation for his nausea. He informed CSW that he was denied at [facility] and states that he plans to just go home. CSW discussed feasibility of going home and support systems for smooth transtion at home. Patient will continue to discuss with family. CSW continues to follow for supportive intervention. Raquel Sarna, [place], [place]

## 2019-06-12 NOTE — Progress Notes (Signed)
LVAD Coordinator Rounding Note:  Admitted 05/28/19 per Dr. Aundra Dubin for heparin bridge for left toe amputation.    HM III LVAD implanted on 06/12/17 by Dr. Darcey Nora under Destination Therapy criteria. Pump exchange performed on 03/01/18 per Dr. Darcey Nora for pump thrombus.   Pt lying in bed, sitting up getting ready to eat lunch. Denies any complaints today.  Pt aware his insurance denied discharge to [facility], but informed him Dr. Aundra Dubin has scheduled peer to peer review this afternoon with his insurance company. Pt is asking if he could go home if insurance continues to deny his admission to long term care facility. Will review with Dr. Aundra Dubin.  Vital signs: Temp: 98.0 HR:  Doppler Pressure:  100 Automatic BP:  120/97 (104) O2 Sat: 95% RA  Wt: 184>186.9>184.5>179.4>181.2>188>183.8 lbs   LVAD interrogation reveals:  Speed: 5500 Flow: 3.9 Power: 4.6w PI: 4.4 Alarms: none Events:  none Hematocrit: 26  Fixed speed: 5500 Low speed limit: 5200   Drive Line: Dressing C/D/I; anchor intact and accurately applied. Weekly dressing changes per bedside nurse; next dressing change due 06/16/19.  Labs:  LDH trend: 200>186>156>159>174>155>158>164>167>159>150  INR trend: 2.5>2.3>1.9>1.8>1.7>1.7>1.7>2.0>2.1>1.9>2.4>2.6  Anticoagulation Plan: -INR Goal: 2.0 - 3.0 -ASA Dose: 81 mg daily   Device: St Jude dual ICD - Therapies: on 200 bpm - Last check: 01/01/18 - 2 hrs Afl/Afib; EMI 04/02/18  Blood: 06/05/19> 1 PRBC 06/07/19> 1 unit PRBC  Infection:  - BC x 2 on 05/30/19>>no growth final - BC x 2 on 06/12/19>>pending  Plan/Recommendations:  1. Call VAD pager if any questions about VAD equipment or drive line.  Zada Girt RN [place] Coordinator  Office: [phone]  24/7 Pager: [phone]

## 2019-06-12 NOTE — Progress Notes (Signed)
Approximately 0235 LVAD alarm was heard from hall. Myself and others entered the room and patient was stable awake watching TV .  LVAD alarm no longer on and Wall monitor showed no indications of trouble or alarms.History of alarms reviewed with entry of what went off. I called charge RN  Herbie [place] from [facility] to also review he found nothing also. I paged Sarah LVAD coordinator she and  I also toggled thru  patients controller with no entries for today. I will continue to monitor patient he continues to be awake watching TV.

## 2019-06-12 NOTE — Plan of Care (Signed)
  Problem: Clinical Measurements: Goal: Ability to maintain clinical measurements within normal limits will improve Outcome: Progressing Goal: Diagnostic test results will improve Outcome: Progressing Goal: Respiratory complications will improve Outcome: Progressing

## 2019-06-12 NOTE — Progress Notes (Signed)
Patient ID: [name], male   DOB: 06/03/1966, 53 y.o.   MRN: [id number]     Advanced Heart Failure VAD Team Note  PCP-Cardiologist: No primary care provider on file.   Subjective:    9/27 S/P R AKA   MAP 80s-100s   Nausea improved.  Right thigh pain improved, no longer getting IV Fentanyl.  Afebrile today, WBCs trending down, 14K today.   LVAD INTERROGATION:  HeartMate 3 LVAD:   Flow 4.0 liters/min, speed 5500, power 4.4  PI 3.6.  No PI events.     Objective:    Vital Signs:   Temp:  [97.8 F (36.6 C)-98.7 F (37.1 C)] 97.8 F (36.6 C) (10/07 0749) Pulse Rate:  [78-83] 79 (10/07 0749) Resp:  [10-20] 10 (10/07 0749) BP: (100-112)/(72-88) 104/88 (10/07 0749) SpO2:  [96 %-100 %] 96 % (10/07 0749) Last BM Date: 06/03/19 Mean arterial Pressure 80s-100s   Intake/Output:   Intake/Output Summary (Last 24 hours) at 06/12/2019 0809 Last data filed at 06/11/2019 1227 Gross per 24 hour  Intake 240 ml  Output -  Net 240 ml     Physical Exam   General: Well appearing this am. NAD.  HEENT: Normal. Neck: Supple, JVP 7-8 cm. Carotids OK.  Cardiac:  Mechanical heart sounds with LVAD hum present.  Lungs:  CTAB, normal effort.  Abdomen:  NT, ND, no HSM. No bruits or masses. +BS  LVAD exit site: Well-healed and incorporated. Dressing dry and intact. No erythema or drainage. Stabilization device present and accurately applied. Driveline dressing changed daily per sterile technique. Extremities:  S/p right AKA (no bleeding at surgical incision), s/p left great toe amputation.  Neuro:  Alert & oriented x 3. Cranial nerves grossly intact. Moves all 4 extremities w/o difficulty. Affect pleasant    Telemetry   NSR 80s personally reviewed.   Labs   Basic Metabolic Panel: Recent Labs  Lab 06/05/19 1414 06/07/19 0421 06/08/19 0426 06/09/19 0448 06/10/19 0500 06/11/19 0446 06/12/19 0527  NA 133* 134* 132* 133* 131* 133*  --   K 4.4 4.2 3.8 3.9 4.4 3.9  --   CL 96* 98  96* 98 95* 96*  --   CO2 23 25 23 23 22 25   --   GLUCOSE 177* 131* 183* 114* 93 101*  --   BUN 52* 38* 32* 50* 60* 33*  --   CREATININE 9.58* 7.83* 5.50* 7.37* 9.52* 6.10*  --   CALCIUM 8.3* 8.6* 8.5* 8.7* 8.7* 8.4*  --   PHOS 7.8*  --   --   --   --   --  7.3*    Liver Function Tests: Recent Labs  Lab 06/05/19 1414  ALBUMIN 1.9*   No results for input(s): LIPASE, AMYLASE in the last 168 hours. No results for input(s): AMMONIA in the last 168 hours.  CBC: Recent Labs  Lab 06/08/19 0426 06/09/19 0448 06/10/19 0500 06/11/19 0446 06/12/19 0527  WBC 28.4* 24.0* 23.4* 15.3* 14.0*  HGB 8.4* 7.9* 8.0* 7.9* 7.4*  HCT 27.5* 25.3* 25.9* 25.5* 24.5*  MCV 89.3 88.5 89.0 89.2 90.4  PLT 386 363 375 321 333    INR: Recent Labs  Lab 06/08/19 0426 06/09/19 0448 06/10/19 0500 06/11/19 0446 06/12/19 0527  INR 1.5* 1.6* 1.9* 2.4* 2.6*    Other results:  EKG:    Imaging   No results found.   Medications:     Scheduled Medications: . sodium chloride   Intravenous Once  . sodium chloride  Intravenous Once  . aspirin EC  81 mg Oral Daily  . atorvastatin  40 mg Oral q1800  . Chlorhexidine Gluconate Cloth  6 each Topical Daily  . Chlorhexidine Gluconate Cloth  6 each Topical Q0600  . Chlorhexidine Gluconate Cloth  6 each Topical Q0600  . citalopram  20 mg Oral Daily  . darbepoetin (ARANESP) injection - DIALYSIS  200 mcg Intravenous Q Wed-HD  . doxercalciferol  1 mcg Intravenous Q M,W,F-HD  . dronabinol  2.5 mg Oral BID AC  . erythromycin  250 mg Oral TID  . gabapentin  300 mg Oral BID  . hydrALAZINE  25 mg Oral 3 times per day on Sun Tue Thu Sat  . influenza vac split quadrivalent PF  0.5 mL Intramuscular Tomorrow-1000  . insulin aspart  0-9 Units Subcutaneous TID WC  . insulin glargine  10 Units Subcutaneous Daily  . metoCLOPramide  10 mg Oral TID WC  . midodrine  5 mg Oral Q M,W,F  . multivitamin  1 tablet Oral QHS  . pantoprazole  40 mg Oral BID AC  .  sevelamer carbonate  2,400 mg Oral TID WC  . Warfarin - Pharmacist Dosing Inpatient   Does not apply q1800    Infusions: . sodium chloride 10 mL/hr at 06/01/19 1200  . sodium chloride 10 mL/hr at 06/02/19 0707    PRN Medications: sodium chloride, acetaminophen, docusate sodium, hydrALAZINE, LORazepam, LORazepam, oxyCODONE, promethazine, promethazine   Assessment/Plan:    1. 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  Pain at right thigh surgical site improving, off PCA.  No further incisional bleeding.  - Concerned that he may eventually need further amputation on left, but no current plans.  L Foot looks ok with some ischemic changes around incision. Vascular has seen, plan to watch and see how things go. No additional plan for surgery this admission.   2. Chronic systolic CHF: Ischemic cardiomyopathy. St Jude ICD. NSTEMI in March 2018 with DES to LAD and RCA, complicated by cardiogenic shock, low output requiring milrinone. Echo 8/18 with EF 20-25%, moderate MR. CPX (8/18) with severe functional impairment due to HF. He is s/p HM3 LVAD placement. He had pump thrombosis 6/19 due to [facility] outflow graft kinking. He had pump exchange for another HM3. Not a candidate for transplant with PAD. Volume status managed at HD. INR 2.6 today.  - Continue ASA 81.   - Continue warfarin.   3. ESRD: HD on M-W-F with midodrine pre-HD.   4.CAD: NSTEMI in 3/18. LHC with 99% ulcerated lesion proximal RCA with left to right collaterals, 95% mid LAD stenosis after mid LAD stent. s/p PCI to RCA and LAD on 11/25/16.No chest pain.  - Continue statin and ASA. 5. h/o DVTs: Factor V Leiden heterozygote. -Anticoagulated. 6. Diabetic gastroparesis: Long history of this.  Increased nausea post-op, may be related to increased narcotic use. This is now improved.  - Continue reglan 10 mg tid/ac and Marinol.  - Continue Protonix bid.  - He will continue  erythromycinindefinitely.  - Will order low dose IV Ativan to help with nausea if needed.  - Limit narcotic use as much as possible. 7. HTN: Continue hydralazine 25 mg tid on non-HD days. Stable.  8. Pain: Improving, now off IV meds.  9. ID: Chronic WBC elevation likely related to ischemic extremities. Abx expanded on 9/26 to vancomycin/meropenem, completed course on 10/2.  Blood cultures negative. WBCs trending down today and afebrile.  10. Anemia: post-op anemia + anemia of renal disease.  He got 1 unit PRBCs with HD on 9/30 and again on 10/2.  Hgb 7.4 today, stable.  No further bleeding noted at surgical site.   - At this point, will transfuse hgb < 7.  11. Disposition: Continue to work with PT.  It looks like his insurance will not cover [facility].  His family is working on building wheelchair ramp and PT is working with him on transfers.    Length of Stay: [place], [place] 06/12/2019, 8:09 AM  VAD Team --- VAD ISSUES ONLY--- Pager [phone] (7am - 7am)  Advanced Heart Failure Team  Pager [phone] (M-F; 7a - 4p)  Please contact [facility] for night-coverage after hours (4p -7a ) and weekends on [email or web address]

## 2019-06-12 NOTE — Telephone Encounter (Signed)
CSW contacted patient's daughter Margreta Journey. She states she has been working with her church to have a ramp placed in front of the house. She noted a challenge with a light that would need to be moved for placement of the ramp. She will continue to keep CSW updated on progress. CSW and daughter discussed rehab and waiting for MD discussion with [facility] for final determination. Daughter states "my brother does most of the work" and will discuss further with him about patient coming home as the discharge plan. CSW discussed further goals of care and family discussion with patient when he returns home. Daughter states they have not discussed openly goals of care and that patient has stated that "worrys about getting dementia". CSW provided supportive intervention around goals of care and quality of life. Daughter appreciative of call and will follow up with CSW as needed. Raquel Sarna, [place], [place]

## 2019-06-12 NOTE — Progress Notes (Signed)
Patient via [place] chair to dialysis

## 2019-06-12 NOTE — Progress Notes (Signed)
  Progress Note    06/12/2019 8:34 AM 10 Days Post-Op  Subjective:  No rest pain L foot; R AKA feels much better   Vitals:   06/12/19 0349 06/12/19 0749  BP: 102/78 104/88  Pulse: 80 79  Resp: 16 10  Temp: 98.4 F (36.9 C) 97.8 F (36.6 C)  SpO2: 100% 96%    Physical Exam: Incisions:  R AKA dry without areas of firmness or fluctuance; L AT and peroneal by doppler   CBC    Component Value Date/Time   WBC 14.0 (H) 06/12/2019 0527   RBC 2.71 (L) 06/12/2019 0527   HGB 7.4 (L) 06/12/2019 0527   HGB 11.7 (L) 09/07/2016 1457   HCT 24.5 (L) 06/12/2019 0527   HCT 37.5 09/07/2016 1457   PLT 333 06/12/2019 0527   PLT 279 09/07/2016 1457   MCV 90.4 06/12/2019 0527   MCV 81 09/07/2016 1457   MCH 27.3 06/12/2019 0527   MCHC 30.2 06/12/2019 0527   RDW 18.0 (H) 06/12/2019 0527   RDW 14.9 09/07/2016 1457   LYMPHSABS 1.0 05/28/2019 1055   LYMPHSABS 1.3 09/07/2016 1457   MONOABS 1.5 (H) 05/28/2019 1055   EOSABS 0.4 05/28/2019 1055   EOSABS 0.2 09/07/2016 1457   BASOSABS 0.1 05/28/2019 1055   BASOSABS 0.0 09/07/2016 1457    BMET    Component Value Date/Time   NA 133 (L) 06/11/2019 0446   NA 144 05/11/2017 0938   K 3.9 06/11/2019 0446   CL 96 (L) 06/11/2019 0446   CO2 25 06/11/2019 0446   GLUCOSE 101 (H) 06/11/2019 0446   BUN 33 (H) 06/11/2019 0446   BUN 68 (H) 05/11/2017 0938   CREATININE 6.10 (H) 06/11/2019 0446   CREATININE 1.63 (H) 11/09/2016 1505   CALCIUM 8.4 (L) 06/11/2019 0446   CALCIUM 8.7 06/27/2017 1152   GFRNONAA 10 (L) 06/11/2019 0446   GFRNONAA 48 (L) 11/09/2016 1505   GFRAA 11 (L) 06/11/2019 0446   GFRAA 55 (L) 11/09/2016 1505    INR    Component Value Date/Time   INR 2.6 (H) 06/12/2019 0527     Intake/Output Summary (Last 24 hours) at 06/12/2019 0834 Last data filed at 06/11/2019 1227 Gross per 24 hour  Intake 240 ml  Output -  Net 240 ml     Assessment/Plan:  53 y.o. male is s/p right above knee amputation  10 Days Post-Op  - R AKA  healing well, dry - L foot incision with darkening skin edges; L 4th toe superficial wound; high risk for L AKA, no plans for surgery during this admission - Insurance denied LTAC; will likely go home with [facility] when mobility improves   Dagoberto Ligas, PA-C [facility] [phone] 06/12/2019 8:34 AM

## 2019-06-12 NOTE — Progress Notes (Signed)
OT Cancellation Note  Patient Details Name: [name] MRN: [id number] DOB: July 19, 1966   Cancelled Treatment:    Reason Eval/Treat Not Completed: Patient at procedure or test/ unavailable(HD)  [name] [name] 06/12/2019, 1:52 PM   [name] OTR/L Acute Rehabilitation Services Pager: [phone] Office: [phone]

## 2019-06-12 NOTE — Progress Notes (Signed)
[facility] Progress Note   Assessment/ Plan:   OP Dialysis:[facility] MWF 4h 41mn 400/800 84kg 2K/3Ca Hep none TDC Venofer 1050mIV x 5 until 9/28 Mircera 200 mcg IV q 2 weeks (last 9/16)  Hectorol 57m88mIV TIW  Problem/Plan: # PAD bilateral LE's: SP L great toe amp on 9/23. 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 Per VVS.  Bloody drainage resolved, close to d/c.  Off antibiotics at this time, WBC down, blood cultures collected 10/6 d/t low-grade fever of 100.8  # ESRD -HD MWF.On schedule 10/7.  Pt requests 1st shift--> was not able to accommodate today d/t emergent case  # Hypertension/volume - No volume excess on exam.Will need new EDW post amputation.   # Anemia- HGB 7.9 ,last outpt Mircera on 9/16.Got darbe 200 ug here on 9/30and ordered weekly wed hd . Rec'd 1 unit prbc's 9/30 and to get 2nd prbc 10/2.   # Metabolic bone disease -lowered VDRA d/t high corrected Ca, phos above goal- pt asking about different binders-will try renvela powder pack today  # CAD/ICM s/p LVAD - per HF team   # Dispo: likely home with HH  Subjective:    Nausea improved, insurance denied LTACH.  Sitting in bed, no complaints.    Objective:   BP 104/88 (BP Location: Left Arm)   Pulse 79   Temp 97.8 F (36.6 C) (Axillary)   Resp 10   Ht 5' 7.99" (1.727 m)   Wt 83.4 kg   SpO2 96%   BMI 27.96 kg/m   Physical Exam: Gen: NAD, appears well CVS: mechanical hum heard throughout the precordium Resp: RRR  Abd: obese Ext: s/p R AKA, L great toe amp, dusky edges, really unchanged ACCESS: L IJ TDC  Labs: BMET Recent Labs  Lab 06/05/19 1414 06/07/19 0421 06/08/19 0426 06/09/19 0448 06/10/19 0500 06/11/19 0446 06/12/19 0527  NA 133* 134* 132* 133* 131* 133*  --   K 4.4 4.2 3.8 3.9 4.4 3.9  --   CL 96* 98 96* 98 95* 96*  --   CO2 23 25 23 23 22 25   --   GLUCOSE 177* 131* 183* 114* 93 101*  --   BUN 52* 38* 32* 50* 60* 33*  --   CREATININE  9.58* 7.83* 5.50* 7.37* 9.52* 6.10*  --   CALCIUM 8.3* 8.6* 8.5* 8.7* 8.7* 8.4*  --   PHOS 7.8*  --   --   --   --   --  7.3*   CBC Recent Labs  Lab 06/09/19 0448 06/10/19 0500 06/11/19 0446 06/12/19 0527  WBC 24.0* 23.4* 15.3* 14.0*  HGB 7.9* 8.0* 7.9* 7.4*  HCT 25.3* 25.9* 25.5* 24.5*  MCV 88.5 89.0 89.2 90.4  PLT 363 375 321 333    @IMGRELPRIORS @ Medications:    . sodium chloride   Intravenous Once  . sodium chloride   Intravenous Once  . aspirin EC  81 mg Oral Daily  . atorvastatin  40 mg Oral q1800  . Chlorhexidine Gluconate Cloth  6 each Topical Daily  . Chlorhexidine Gluconate Cloth  6 each Topical Q0600  . Chlorhexidine Gluconate Cloth  6 each Topical Q0600  . citalopram  20 mg Oral Daily  . darbepoetin (ARANESP) injection - DIALYSIS  200 mcg Intravenous Q Wed-HD  . doxercalciferol  1 mcg Intravenous Q M,W,F-HD  . dronabinol  2.5 mg Oral BID AC  . erythromycin  250 mg Oral TID  . gabapentin  300 mg Oral BID  .  hydrALAZINE  25 mg Oral 3 times per day on Sun Tue Thu Sat  . influenza vac split quadrivalent PF  0.5 mL Intramuscular Tomorrow-1000  . insulin aspart  0-9 Units Subcutaneous TID WC  . insulin glargine  10 Units Subcutaneous Daily  . metoCLOPramide  10 mg Oral TID WC  . midodrine  5 mg Oral Q M,W,F  . multivitamin  1 tablet Oral QHS  . pantoprazole  40 mg Oral BID AC  . warfarin  5 mg Oral Once per day on Sun Mon Wed Thu Sat  . [START ON 06/14/2019] warfarin  7.5 mg Oral Once per day on Tue Fri  . Warfarin - Pharmacist Dosing Inpatient   Does not apply q1800     Madelon Lips, MD 06/12/2019, 10:19 AM

## 2019-06-12 NOTE — Progress Notes (Signed)
ANTICOAGULATION CONSULT NOTE - Follow Up Consult  Pharmacy Consult for warfarin Indication: LVAD HM3  Allergies  Allergen Reactions  . Metformin And Related Diarrhea    Patient Measurements: Height: 5' 7.99" (172.7 cm) Weight: 183 lb 13.8 oz (83.4 kg) IBW/kg (Calculated) : 68.38   Vital Signs: Temp: 97.8 F (36.6 C) (10/07 0749) Temp Source: Axillary (10/07 0749) BP: 104/88 (10/07 0749) Pulse Rate: 79 (10/07 0749)  Labs: Recent Labs    06/10/19 0500 06/11/19 0446 06/12/19 0527  HGB 8.0* 7.9* 7.4*  HCT 25.9* 25.5* 24.5*  PLT 375 321 333  LABPROT 21.4* 25.9* 27.4*  INR 1.9* 2.4* 2.6*  HEPARINUNFRC 0.41  --   --   CREATININE 9.52* 6.10*  --     Estimated Creatinine Clearance: 14.6 mL/min (A) (by C-G formula based on SCr of 6.1 mg/dL (H)).   Medical History: Past Medical History:  Diagnosis Date  . Angina   . ASCVD (arteriosclerotic cardiovascular disease)    , Anterior infarction 2005, LAD diagonal bifurcation intervention 03/2004  . Automatic implantable cardiac defibrillator -St. Jude's       . Benign neoplasm of colon   . CHF (congestive heart failure) ([place])   . Chronic systolic heart failure ([place])   . Coronary artery disease     Widely patent previously placed stents in the left anterior   . Crohn's disease ([place])   . 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 Dialysis patient ([place])   . Dyspnea   . Gastroparesis   . GERD (gastroesophageal reflux disease)   . High cholesterol   . Hyperlipidemia   . Hypersomnolent    Previous diagnosis of narcolepsy  . Hypertension, essential   . Ischemic cardiomyopathy    Ejection fraction 15-20% catheterization 2010  . Type II or unspecified type diabetes mellitus without mention of complication, not stated as uncontrolled   . Unspecified gastritis and gastroduodenitis without mention of hemorrhage     Assessment: 6 yoM on warfarin PTA for LVAD and hx FV Leiden admitted for ongoing foot ischemia.  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 Pharmacy dosing warfarin. Heparin off 10/5.  -INR 2.6, h/h trending down no bleeding noted Nausea/emesis has resolved - usually feels bad post HD. Pain controlled with gabapentin and prn oxy.  PTA warfarin 7.81m Tu/Fr 556mQOD  Goal of Therapy:  INR 2-3  Monitor platelets by anticoagulation protocol: Yes   Plan:  Warfarin 37m67mWThuSS and 7.37mg33mFr Restart PTA dosing   LisaBonnita Nasutirm.D. CPP, BCPS Clinical Pharmacist [phone]/2020 8:17 AM    Please check [email or web address] for unit-specific pharmacist phone numbers

## 2019-06-13 LAB — LACTATE DEHYDROGENASE: LDH: 186 U/L (ref 98–192)

## 2019-06-13 LAB — CBC
HCT: 31.5 % — ABNORMAL LOW (ref 39.0–52.0)
Hemoglobin: 10.1 g/dL — ABNORMAL LOW (ref 13.0–17.0)
MCH: 28.1 pg (ref 26.0–34.0)
MCHC: 32.1 g/dL (ref 30.0–36.0)
MCV: 87.7 fL (ref 80.0–100.0)
Platelets: 243 10*3/uL (ref 150–400)
RBC: 3.59 MIL/uL — ABNORMAL LOW (ref 4.22–5.81)
RDW: 17.5 % — ABNORMAL HIGH (ref 11.5–15.5)
WBC: 10.8 10*3/uL — ABNORMAL HIGH (ref 4.0–10.5)
nRBC: 0 % (ref 0.0–0.2)

## 2019-06-13 LAB — GLUCOSE, CAPILLARY
Glucose-Capillary: 113 mg/dL — ABNORMAL HIGH (ref 70–99)
Glucose-Capillary: 218 mg/dL — ABNORMAL HIGH (ref 70–99)
Glucose-Capillary: 166 mg/dL — ABNORMAL HIGH (ref 70–99)

## 2019-06-13 LAB — BASIC METABOLIC PANEL
Anion gap: 9 (ref 5–15)
BUN: 37 mg/dL — ABNORMAL HIGH (ref 6–20)
CO2: 25 mmol/L (ref 22–32)
Calcium: 7.9 mg/dL — ABNORMAL LOW (ref 8.9–10.3)
Chloride: 94 mmol/L — ABNORMAL LOW (ref 98–111)
Creatinine, Ser: 5.95 mg/dL — ABNORMAL HIGH (ref 0.61–1.24)
GFR calc Af Amer: 11 mL/min — ABNORMAL LOW (ref >60)
GFR calc non Af Amer: 10 mL/min — ABNORMAL LOW (ref >60)
Glucose, Bld: 201 mg/dL — ABNORMAL HIGH (ref 70–99)
Potassium: 3.8 mmol/L (ref 3.5–5.1)
Sodium: 128 mmol/L — ABNORMAL LOW (ref 135–145)

## 2019-06-13 LAB — PROTIME-INR
INR: 2.8 — ABNORMAL HIGH (ref 0.8–1.2)
Prothrombin Time: 28.7 seconds — ABNORMAL HIGH (ref 11.4–15.2)

## 2019-06-13 LAB — SARS CORONAVIRUS 2 BY RT PCR (HOSPITAL ORDER, PERFORMED IN ~~LOC~~ HOSPITAL LAB): SARS Coronavirus 2: NEGATIVE

## 2019-06-13 MED ORDER — PRO-STAT SUGAR FREE PO LIQD
30.0000 mL | Freq: Two times a day (BID) | ORAL | Status: DC
  Administered 2019-06-13 – 2019-06-16 (×6): 30 mL via ORAL
  Filled 2019-06-13 (×6): qty 30

## 2019-06-13 MED ORDER — NEPRO/CARBSTEADY PO LIQD
237.0000 mL | Freq: Two times a day (BID) | ORAL | Status: DC
  Administered 2019-06-13 – 2019-06-16 (×5): 237 mL via ORAL

## 2019-06-13 NOTE — Progress Notes (Signed)
[facility] Progress Note   Assessment/ Plan:   OP Dialysis:[facility] MWF 4h 10mn 400/800 84kg 2K/3Ca Hep none TDC Venofer 1033mIV x 5 until 9/28 Mircera 200 mcg IV q 2 weeks (last 9/16)  Hectorol 46m65mIV TIW  Problem/Plan: # PAD bilateral LE's: SP L great toe amp on 9/23. 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 Per VVS.  Bloody drainage resolved, close to d/c.  Off antibiotics at this time, WBC down, blood cultures collected 10/6 d/t low-grade fever of 100.8, NGTD  # ESRD -HD MWF.On schedule 10/7.  Pt requests 1st shift, next planned 10/9  # Hypertension/volume - No volume excess on exam.Will need new EDW post amputation.   # Anemia- low,last outpt Mircera on 9/16.Got darbe 200 ug here on 9/30and ordered weekly wed hd . Rec'd 1 unit prbc's 9/30 and to get 2nd prbc 10/2.   # Metabolic bone disease -lowered VDRA d/t high corrected Ca, phos above goal- pt asking about different binders-will try renvela powder pack TID AC [facility] CAD/ICM s/p LVAD - per HF team   # Dispo: likely home with HH  Subjective:    Sleeping this AM.  No complaints.      Objective:   BP (!) 118/91 (BP Location: Left Arm)   Pulse 86   Temp 99 F (37.2 C) (Oral)   Resp 16   Ht 5' 7.99" (1.727 m)   Wt 84.3 kg   SpO2 95%   BMI 28.26 kg/m   Physical Exam: Gen: NAD, appears well CVS: mechanical hum heard throughout the precordium Resp: RRR  Abd: obese Ext: s/p R AKA, L great toe amp, dusky edges, really unchanged ACCESS: L IJ TDC  Labs: BMET Recent Labs  Lab 06/07/19 0421 06/08/19 0426 06/09/19 0448 06/10/19 0500 06/11/19 0446 06/12/19 0527 06/12/19 1236  NA 134* 132* 133* 131* 133*  --  129*  K 4.2 3.8 3.9 4.4 3.9  --  4.1  CL 98 96* 98 95* 96*  --  95*  CO2 25 23 23 22 25   --  21*  GLUCOSE 131* 183* 114* 93 101*  --  206*  BUN 38* 32* 50* 60* 33*  --  52*  CREATININE 7.83* 5.50* 7.37* 9.52* 6.10*  --  8.39*  CALCIUM 8.6* 8.5* 8.7* 8.7*  8.4*  --  8.1*  PHOS  --   --   --   --   --  7.3* 7.1*   CBC Recent Labs  Lab 06/10/19 0500 06/11/19 0446 06/12/19 0527 06/13/19 0500  WBC 23.4* 15.3* 14.0* 10.8*  HGB 8.0* 7.9* 7.4* 10.1*  HCT 25.9* 25.5* 24.5* 31.5*  MCV 89.0 89.2 90.4 87.7  PLT 375 321 333 243    @IMGRELPRIORS @ Medications:    . sodium chloride   Intravenous Once  . sodium chloride   Intravenous Once  . aspirin EC  81 mg Oral Daily  . atorvastatin  40 mg Oral q1800  . Chlorhexidine Gluconate Cloth  6 each Topical Daily  . Chlorhexidine Gluconate Cloth  6 each Topical Q0600  . Chlorhexidine Gluconate Cloth  6 each Topical Q0600  . citalopram  20 mg Oral Daily  . darbepoetin (ARANESP) injection - DIALYSIS  200 mcg Intravenous Q Wed-HD  . doxercalciferol  1 mcg Intravenous Q M,W,F-HD  . dronabinol  2.5 mg Oral BID AC  . erythromycin  250 mg Oral TID  . gabapentin  300 mg Oral BID  . hydrALAZINE  25 mg Oral 3 times  per day on Sun Tue Thu Sat  . influenza vac split quadrivalent PF  0.5 mL Intramuscular Tomorrow-1000  . insulin aspart  0-9 Units Subcutaneous TID WC  . insulin glargine  10 Units Subcutaneous Daily  . metoCLOPramide  10 mg Oral TID WC  . midodrine  5 mg Oral Q M,W,F  . multivitamin  1 tablet Oral QHS  . pantoprazole  40 mg Oral BID AC  . sevelamer carbonate  0.8 g Oral TID WC  . warfarin  5 mg Oral Once per day on Sun Mon Wed Thu Sat  . [START ON 06/14/2019] warfarin  7.5 mg Oral Once per day on Tue Fri  . Warfarin - Pharmacist Dosing Inpatient   Does not apply q1800     Madelon Lips, MD 06/13/2019, 10:12 AM

## 2019-06-13 NOTE — Plan of Care (Signed)
Problem: Health Behavior/Discharge Planning: Goal: Ability to manage health-related needs will improve 06/13/2019 1013 by Don Perking, RN Outcome: Progressing 06/13/2019 1013 by Don Perking, RN Outcome: Progressing   Problem: Clinical Measurements: Goal: Ability to maintain clinical measurements within normal limits will improve 06/13/2019 1013 by Don Perking, RN Outcome: Progressing 06/13/2019 1013 by Don Perking, RN Outcome: Progressing Goal: Will remain free from infection 06/13/2019 1013 by Don Perking, RN Outcome: Progressing 06/13/2019 1013 by Don Perking, RN Outcome: Progressing Goal: Diagnostic test results will improve 06/13/2019 1013 by Don Perking, RN Outcome: Progressing 06/13/2019 1013 by Don Perking, RN Outcome: Progressing Goal: Respiratory complications will improve 06/13/2019 1013 by Don Perking, RN Outcome: Progressing 06/13/2019 1013 by Don Perking, RN Outcome: Progressing Goal: Cardiovascular complication will be avoided 06/13/2019 1013 by Don Perking, RN Outcome: Progressing 06/13/2019 1013 by Don Perking, RN Outcome: Progressing   Problem: Activity: Goal: Risk for activity intolerance will decrease 06/13/2019 1013 by Don Perking, RN Outcome: Progressing 06/13/2019 1013 by Don Perking, RN Outcome: Progressing   Problem: Nutrition: Goal: Adequate nutrition will be maintained 06/13/2019 1013 by Don Perking, RN Outcome: Progressing 06/13/2019 1013 by Don Perking, RN Outcome: Progressing   Problem: Coping: Goal: Level of anxiety will decrease 06/13/2019 1013 by Don Perking, RN Outcome: Progressing 06/13/2019 1013 by Don Perking, RN Outcome: Progressing   Problem: Elimination: Goal: Will not experience complications related to bowel motility 06/13/2019 1013 by Don Perking, RN Outcome: Progressing 06/13/2019 1013 by Don Perking, RN Outcome: Progressing   Problem: Pain Managment: Goal: General experience of comfort will improve 06/13/2019 1013 by Don Perking, RN Outcome: Progressing 06/13/2019 1013 by Don Perking, RN Outcome: Progressing   Problem: Pain Managment: Goal: General experience of comfort will improve 06/13/2019 1013 by Don Perking, RN Outcome: Progressing 06/13/2019 1013 by Don Perking, RN Outcome: Progressing   Problem: Safety: Goal: Ability to remain free from injury will improve 06/13/2019 1013 by Don Perking, RN Outcome: Progressing 06/13/2019 1013 by Don Perking, RN Outcome: Progressing   Problem: Skin Integrity: Goal: Risk for impaired skin integrity will decrease 06/13/2019 1013 by Don Perking, RN Outcome: Progressing 06/13/2019 1013 by Don Perking, RN Outcome: Progressing   Problem: Education: Goal: Patient will understand all VAD equipment and how it functions 06/13/2019 1013 by Don Perking, RN Outcome: Progressing 06/13/2019 1013 by Don Perking, RN Outcome: Progressing Goal: Patient will be able to verbalize current INR target range and antiplatelet therapy for discharge home 06/13/2019 1013 by Don Perking, RN Outcome: Progressing 06/13/2019 1013 by Don Perking, RN Outcome: Progressing   Problem: Cardiac: Goal: LVAD will function as expected and patient will experience no clinical alarms 06/13/2019 1013 by Don Perking, RN Outcome: Progressing 06/13/2019 1013 by Don Perking, RN Outcome: Progressing   Problem: Education: Goal: Knowledge of the prescribed therapeutic regimen will improve 06/13/2019 1013 by Don Perking, RN Outcome: Progressing 06/13/2019 1013 by Don Perking, RN Outcome: Progressing Goal: Ability to verbalize activity precautions or restrictions will improve 06/13/2019 1013 by Don Perking, RN Outcome: Progressing 06/13/2019 1013 by Don Perking, RN Outcome: Progressing Goal: Understanding of discharge needs will improve 06/13/2019 1013 by Don Perking, RN Outcome: Progressing 06/13/2019 1013 by Don Perking, RN Outcome: Progressing   Problem: Self-Concept: Goal: Ability to maintain and perform role responsibilities to the  fullest extent possible will improve 06/13/2019 1013 by Don Perking, RN Outcome: Progressing 06/13/2019 1013 by Don Perking, RN Outcome: Progressing   Problem: Self-Care: Goal: Ability to meet self-care needs will improve 06/13/2019 1013 by Don Perking, RN Outcome: Progressing 06/13/2019 1013 by Don Perking, RN Outcome: Progressing   Problem: Pain Management: Goal: Pain level will decrease with appropriate interventions Outcome: Progressing   Problem: Clinical Measurements: Goal: Postoperative complications will be avoided or minimized 06/13/2019 1013 by Don Perking, RN Outcome: Progressing 06/13/2019 1013 by Don Perking, RN Outcome: Progressing

## 2019-06-13 NOTE — Discharge Summary (Signed)
Discharge Summary    Patient ID: [name],  MRN: [id number], DOB/AGE: 1966-04-18 53 y.o.  Admit date: 05/28/2019 Discharge date: 06/16/2019  Primary Care Provider: Richarda Osmond Primary Cardiologist: Dr. Aundra Dubin   Discharge Diagnoses    Active Problems:   Ischemic foot   LVAD  Chronic systolic CHF/ICM  ESRD on HD  CAD  DM HTN  Allergies Allergies  Allergen Reactions  . 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  Admitted 7/10 with ischemic right foot. VVS consulted and on 7/13 he underwent peripheral angiogram that showed 80% stenosis right SFV and occluded right PT and had stent to right SFA and angioplasty to PT. He then had 4th toe amputation. He was discharged to home on 03/22/19.  Admitted7/22/20withrecurrent n/v, leukocytosis and ischemic changes in right foot. Admitted to  ICU for treatment of severe gastroparesisand possible ischemic foot. VVS , Nephrology, ID consulted.He was treated with IV vancomycin for possible osteomyelitis/infection, but thought that more likely ischemia could explain all the findings. He was discharged home to followup with vascular surgery.  He presented back to [facility] on 05/28/19 for scheduled admit due to ischemia of the left great toe w/ plans for left great toe amputation. Last dose of coumadin was on 05/24/19.  On admit, he complained of having difficulty walking. Denied BRBPR. Denied SOB. Complained of nausea but denied fever or chills.    Hospital Course     He was admitted by AHF. Coumadin held on admit. ASA continued. Bridged w/ heparin. Antibiotics were  ordered. Nephrology was consulted and assisted w/ HD.   On 9/23, he underwent amputation of the left great toe by Dr. Trula Slade. On 9/27, he had Rt BKA, done by Dr. Donnetta Hutching. He had post op anemia, requiring transfusion x 2. He got 1 unit PRBCs with HD on 9/30 and again on 10/2.  He also developed leukocytosis postop. Antibiotics expanded to vancomycin/meropenem. Completed course on 10/2.  Blood cultures negative. WBCs trending down and afebrile. No other complications. He did require IV pain medications post op but was weaned off. Incision sites remained stable. Coumadin was resumed post op and INR returned to therapeutic range. See below for detailed problem list of all hospital medical problems addressed this admission.   1. 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 Pain at right thigh surgical site improving, off PCA.  No further incisional bleeding.  - Concerned that he may eventually need further amputation on left, but no current plans.  L Foot looks ok with some ischemic changes around incision. Vascular has seen, plan to watch and see how things go. No additional plan for surgery this admission.   2. Chronic systolic CHF: Ischemic cardiomyopathy. St Jude ICD. NSTEMI in March 2018 with DES to LAD and RCA, complicated by cardiogenic shock, low output requiring milrinone. Echo 8/18 with EF 20-25%, moderate [name]. CPX (8/18) with severe functional impairment due to HF. He is s/p HM3 LVAD placement. He had pump thrombosis 6/19 due to [facility] outflow graft kinking. He had pump exchange for another HM3.Not  a candidate for transplant with PAD.Volume status managed at HD.  - INR 4.1 today.  - Continue ASA 81.  - Hold Warfarin today.     3. ESRD: HD on M-W-F with midodrine pre-HD.   4.CAD: NSTEMI in 3/18. LHC with 99% ulcerated lesion proximal RCA with left to right collaterals, 95% mid LAD stenosis after mid LAD stent. s/p PCI to RCA and LAD on  11/25/16.No chest pain.  - Continue statin and ASA.  5. h/o DVTs: Factor V Leiden heterozygote. -Anticoagulated.  6. Diabetic gastroparesis: Long history of this.  Increased nausea post-op, may be related to increased narcotic use. Still occasional nausea but getting better. - Continue reglan 10 mg tid/ac and Marinol.  - Continue Protonix bid.  - He will continue erythromycinindefinitely.  - Given low dose IV Ativan to help with nausea if needed.  - Limit narcotic use as much as possible.  7. HTN: Continue hydralazine 25 mg tid on non-HD days. Stable.   8. Pain: Improving, now off IV meds.   9. ID: Chronic WBC elevation likely related to ischemic extremities. Abx expanded on 9/26 to vancomycin/meropenem, completed course on 10/2.  Blood cultures negative. Overall WBCs have trended lower and he remains afebrile.    10. Anemia: post-op anemia + anemia of renal disease.  He got 1 unit PRBCs with HD on 9/30 and again on 10/2.   No further bleeding noted at surgical site.   - hgb down today 7.9 -> 7.2 - At this point, will transfuse hgb < 7.   11. Disposition: D/c to SNF was recommended for rehab. Pt accepted by [facility].   __________________ On 06/16/2019 pt was last seen and examined by Dr. Dr. Haroldine Laws who determined he was stable for d/c to nursing facility.  He will need VAD management (Heartmate 3), frequent INR checks (INR goal 2.0-3.0) and HD.   If any VAD issues, can page VAD Team: Pager [phone]   Consultants: Vascular Surgery, Nephrology, Infectious Disease,    LVAD Interrogation HM II: Speed: Flow 3.9 liters/min, speed 5500, power 4.4 PI 4.3.  No PI events.    _____________  Discharge Vitals Blood pressure 102/66, pulse (!) 55, temperature 98.8 F (37.1 C), temperature source Oral, resp. rate 15, height 5' 7.99" (1.727 m), weight 86.3 kg, SpO2 (!) 83 %.  Filed Weights   06/14/19 1055 06/15/19 0608 06/16/19 0411  Weight: 84.5 kg 83.8 kg 86.3 kg    Labs &  Radiologic Studies    CBC Recent Labs    06/15/19 0415 06/16/19 0239  WBC 14.0* 14.2*  HGB 7.9* 7.2*  HCT 26.4* 24.1*  MCV 89.8 90.3  PLT 338 500   Basic Metabolic Panel Recent Labs    06/15/19 0415 06/16/19 0239  NA 135 130*  K 3.7 3.9  CL 96* 94*  CO2 27 24  GLUCOSE 90 174*  BUN 35* 56*  CREATININE 4.95* 6.69*  CALCIUM 8.4* 8.2*  PHOS 4.5 4.6   Liver Function Tests Recent Labs    06/15/19 0415 06/16/19 0239  ALBUMIN 1.9* 1.8*   No results for input(s): LIPASE, AMYLASE in the last 72 hours. Cardiac Enzymes No results for input(s): CKTOTAL, CKMB, CKMBINDEX, TROPONINI in the last 72 hours. BNP Invalid input(s): POCBNP D-Dimer No results for input(s): DDIMER in the last 72 hours. Hemoglobin A1C No results for input(s): HGBA1C in the last 72 hours. Fasting Lipid Panel No results for input(s): CHOL, HDL, LDLCALC, TRIG, CHOLHDL, LDLDIRECT in the last 72 hours. Thyroid Function  Tests No results for input(s): TSH, T4TOTAL, T3FREE, THYROIDAB in the last 72 hours.  Invalid input(s): FREET3 _____________  Dg Chest Port 1 View  Result Date: 05/28/2019 CLINICAL DATA:  Encounter for central line placement EXAM: PORTABLE CHEST - 1 VIEW COMPARISON:  05/02/2019 FINDINGS: Right IJ central line to the cavoatrial junction. Tunneled left IJ hemodialysis catheter tip near the cavoatrial junction. No pneumothorax. Relatively low lung volumes. Patchy coarse atelectasis or scarring at the right lung base as before. No new infiltrate or overt edema. Left subclavian AICD and LVAD are stable in position. Previous median sternotomy. Coronary calcifications. Heart size within normal limits for technique. No effusion. IMPRESSION: 1. Central lines to the cavoatrial junction.  No pneumothorax. 2. Stable postop changes. 3. Stable right lower lobe scarring/atelectasis. Electronically Signed   By: [name] M.D.   On: 05/28/2019 12:35   Disposition   Pt is being discharged to [place] today in  good condition.  Follow-up Plans & Appointments    Follow-up Information    [facility] Follow up on 06/21/2019.   Specialty: Vascular Surgery Why: for staple removal  Contact information: [place] [address] [place]. Go on 07/09/2019.   Specialty: Cardiology Why: @9am  for follow up  Contact information: [address] [id number] [place] [phone]         Discharge Instructions    Diet - low sodium heart healthy   Complete by: As directed    Increase activity slowly   Complete by: As directed       Discharge Medications   Allergies as of 06/16/2019      Reactions   Metformin And Related Diarrhea      Medication List    STOP taking these medications   traMADol 50 MG tablet Commonly known as: ULTRAM     TAKE these medications   acetaminophen 325 MG tablet Commonly known as: TYLENOL Take 2 tablets (650 mg total) by mouth every 4 (four) hours as needed for headache or mild pain.   aspirin EC 81 MG tablet Take 1 tablet (81 mg total) by mouth daily.   atorvastatin 40 MG tablet Commonly known as: LIPITOR Take 1 tablet (40 mg total) by mouth daily at 6 PM.   calcitRIOL 0.5 MCG capsule Commonly known as: ROCALTROL Take 1 capsule (0.5 mcg total) by mouth every Monday, Wednesday, and Friday.   citalopram 20 MG tablet Commonly known as: CELEXA Take 1 tablet (20 mg total) by mouth daily.   docusate sodium 100 MG capsule Commonly known as: COLACE Take 1 capsule (100 mg total) by mouth daily as needed for mild constipation. What changed: when to take this   dronabinol 2.5 MG capsule Commonly known as: MARINOL Take 1 capsule (2.5 mg total) by mouth 2 (two) times daily before lunch and supper.   erythromycin 250 MG tablet Commonly known as: ERYTHROCIN Take 250 mg by mouth 3 (three) times  daily.   gabapentin 300 MG capsule Commonly known as: NEURONTIN Take 1 capsule (300 mg total) by mouth 2 (two) times daily.   insulin glargine 100 UNIT/ML injection Commonly known as: LANTUS Inject 0.1 mLs (10 Units total) into the skin daily.   LORazepam 0.5 MG tablet Commonly known as: ATIVAN Take 1 tablet (0.5 mg total) by mouth every 12 (twelve) hours as needed (nausea).   metoCLOPramide 10 MG tablet Commonly known  as: REGLAN Take 1 tablet (10 mg total) by mouth 3 (three) times daily. What changed: when to take this   midodrine 5 MG tablet Commonly known as: PROAMATINE Take 1 tablet (5 mg total) by mouth daily. Take 1 tablet before dialysis treatment What changed: when to take this   multivitamin Tabs tablet Take 1 tablet by mouth daily.   NovoLOG 100 UNIT/ML injection Generic drug: insulin aspart INJECT 7 UNITS INTO THE SKIN DAILY WITH SUPPER. What changed: See the new instructions.   oxyCODONE 5 MG immediate release tablet Commonly known as: Roxicodone Take 1 tablet (5 mg total) by mouth 2 (two) times daily as needed for severe pain. Take 1-2 tablets every 4-6 hours for severe pain.   pantoprazole 40 MG tablet Commonly known as: Protonix Take 1 tablet (40 mg total) by mouth 2 (two) times daily before lunch and supper.   promethazine 12.5 MG tablet Commonly known as: PHENERGAN Take 1 tablet (12.5 mg total) by mouth every 6 (six) hours as needed for nausea or vomiting.   sevelamer carbonate 0.8 g Pack packet Commonly known as: RENVELA Take 0.8 g by mouth 3 (three) times daily with meals.   warfarin 5 MG tablet Commonly known as: COUMADIN Take as directed. If you are unsure how to take this medication, talk to your nurse or doctor. Original instructions: Hold warfarin tonight and tomorrow (06/16/2019 and 06/17/2019). Check INR on 06/18/2019 and start medications based on readings  (previously on 7.33m on MWF and 556mall other days). What changed: additional  instructions         Outstanding Labs/Studies   [facility]   Duration of Discharge Encounter   Greater than 30 minutes including physician time.  Signed, BhCrista Luriahagat PA-C 06/16/2019, 1:49 PM

## 2019-06-13 NOTE — Progress Notes (Signed)
ANTICOAGULATION CONSULT NOTE - Follow Up Consult  Pharmacy Consult for warfarin Indication: LVAD HM3  Allergies  Allergen Reactions  . Metformin And Related Diarrhea    Patient Measurements: Height: 5' 7.99" (172.7 cm) Weight: 185 lb 13.6 oz (84.3 kg) IBW/kg (Calculated) : 68.38   Vital Signs: Temp: 99 F (37.2 C) (10/08 0806) Temp Source: Oral (10/08 0806) BP: 118/91 (10/08 0806) Pulse Rate: 86 (10/08 0806)  Labs: Recent Labs    06/11/19 0446 06/12/19 0527 06/12/19 1236 06/13/19 0500  HGB 7.9* 7.4*  --  10.1*  HCT 25.5* 24.5*  --  31.5*  PLT 321 333  --  243  LABPROT 25.9* 27.4*  --  28.7*  INR 2.4* 2.6*  --  2.8*  CREATININE 6.10*  --  8.39*  --     Estimated Creatinine Clearance: 10.6 mL/min (A) (by C-G formula based on SCr of 8.39 mg/dL (H)).   Medical History: Past Medical History:  Diagnosis Date  . Angina   . ASCVD (arteriosclerotic cardiovascular disease)    , Anterior infarction 2005, LAD diagonal bifurcation intervention 03/2004  . Automatic implantable cardiac defibrillator -St. Jude's       . Benign neoplasm of colon   . CHF (congestive heart failure) ([place])   . Chronic systolic heart failure ([place])   . Coronary artery disease     Widely patent previously placed stents in the left anterior   . Crohn's disease ([place])   . Deep venous thrombosis ([facility])    Recurrent-on Coumadin  . Dialysis patient ([place])   . Dyspnea   . Gastroparesis   . GERD (gastroesophageal reflux disease)   . High cholesterol   . Hyperlipidemia   . Hypersomnolent    Previous diagnosis of narcolepsy  . Hypertension, essential   . Ischemic cardiomyopathy    Ejection fraction 15-20% catheterization 2010  . Type II or unspecified type diabetes mellitus without mention of complication, not stated as uncontrolled   . Unspecified gastritis and gastroduodenitis without mention of hemorrhage     Assessment: 46 yoM on warfarin PTA for LVAD and hx FV Leiden admitted for ongoing  foot ischemia. 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 Pharmacy dosing warfarin. Heparin off 10/5.  -INR 2.8, hemoglobin 10.1, pltc wnl Nausea/emesis comes and goes - usually feels bad post HD. Pain controlled with gabapentin and prn oxy.  PTA warfarin 7.24m Tu/Fr 587mQOD  Goal of Therapy:  INR 2-3  Monitor platelets by anticoagulation protocol: Yes   Plan:  Warfarin 40m92mWThuSS and 7.40mg31mFr Continue PTA dosing  MegaVertis KelcharmD PGY2 Cardiology Pharmacy Resident Phone ([phone]/2020       9:04 AM  Please check [email or web address] for unit-specific pharmacist phone numbers

## 2019-06-13 NOTE — Progress Notes (Signed)
Physical Therapy Treatment Patient Details Name: [name] MRN: [id number] DOB: [birth year] Today's Date: 06/13/2019    History of Present Illness Pt is a 53 y.o. male with hx of LVAD, ESRD, DM, CAD, HLD, s/p right TMA on 04/30/2019 for non-healing amputation site (previous R 4th ray amp 03/21/19); pt now admitted 05/28/19 with difficulty walking due to R foot pain. Now s/p L great toe amputation 9/23. S/p R AKA 9/27.   PT Comments    Pt progressing with mobility. Today's session focused on functional transfers with use of sliding board, requiring assist for set-up. Pt remains limited by BUE/BLE weakness, BLE amputations/WB restrictions, and decreased activity tolerance. LVAD on wall power throughout session. Continue to recommend SNF-level therapies to maximize functional mobility and independence prior to return home.   Follow Up Recommendations  SNF;LTACH;Supervision/Assistance - 24 hour     Equipment Recommendations  Other (comment)(sliding board)    Recommendations for Other Services       Precautions / Restrictions Precautions Precautions: Fall;Other (comment) Precaution Comments: LVAD Restrictions Weight Bearing Restrictions: Yes RLE Weight Bearing: Non weight bearing LLE Weight Bearing: Partial weight bearing LLE Partial Weight Bearing Percentage or Pounds: L foot WB thru heel in darco shoe    Mobility  Bed Mobility Overal bed mobility: Needs Assistance Bed Mobility: Supine to Sit     Supine to sit: Min assist;HOB elevated        Transfers Overall transfer level: Needs assistance Equipment used: Sliding board            Lateral/Scoot Transfers: Min guard;With slide board;Min assist General transfer comment: Assist to place slide board prior to transfer, pt able to remove with cues post-transfer; min guard to perform lateral scoot to drop arm with board, pt cued for sequencing/technique; pt reports more ease using board due to BUE  weakness  Ambulation/Gait                 Stairs             Wheelchair Mobility    Modified Rankin (Stroke Patients Only)       Balance Overall balance assessment: Needs assistance Sitting-balance support: No upper extremity supported;Feet supported Sitting balance-[name]: Fair                                      Cognition Arousal/Alertness: Awake/alert Behavior During Therapy: WFL for tasks assessed/performed;Flat affect Overall Cognitive Status: Within Functional Limits for tasks assessed                                        Exercises      General Comments General comments (skin integrity, edema, etc.): Increased time discussing home set-up, DME needs and assist available from family. Pt states kids working on getting ramp installed into home      Pertinent Vitals/Pain Pain Assessment: Faces Faces Pain Scale: Hurts a little bit Pain Location: L foot in dependent position Pain Descriptors / Indicators: Sore;Discomfort Pain Intervention(s): Monitored during session    Home Living                      Prior Function            PT Goals (current goals can now be found in the care plan section) Progress towards  PT goals: Progressing toward goals    Frequency    Min 3X/week      PT Plan Current plan remains appropriate    Co-evaluation PT/OT/SLP Co-Evaluation/Treatment: Yes Reason for Co-Treatment: For patient/therapist safety;To address functional/ADL transfers PT goals addressed during session: Mobility/safety with mobility;Proper use of DME;Balance        AM-PAC PT "6 Clicks" Mobility   Outcome Measure  Help needed turning from your back to your side while in a flat bed without using bedrails?: A Little Help needed moving from lying on your back to sitting on the side of a flat bed without using bedrails?: A Little Help needed moving to and from a bed to a chair (including a  wheelchair)?: A Little Help needed standing up from a chair using your arms (e.g., wheelchair or bedside chair)?: Total Help needed to walk in hospital room?: Total Help needed climbing 3-5 steps with a railing? : Total 6 Click Score: 12    End of Session   Activity Tolerance: Patient tolerated treatment well Patient left: in chair;with call bell/phone within reach Nurse Communication: Mobility status PT Visit Diagnosis: Other abnormalities of gait and mobility (R26.89);Pain;Unsteadiness on feet (R26.81) Pain - Right/Left: Left Pain - part of body: Ankle and joints of foot     Time: 1035-1100 PT Time Calculation (min) (ACUTE ONLY): 25 min  Charges:  $Therapeutic Activity: 8-22 mins                    Mabeline Caras, PT, DPT Acute Rehabilitation Services  Pager [phone] Office [place] 06/13/2019, 12:49 PM

## 2019-06-13 NOTE — Progress Notes (Signed)
Patient ID: [name], male   DOB: May 14, 1966, 53 y.o.   MRN: [id number]    Advanced Heart Failure VAD Team Note  PCP-Cardiologist: No primary care provider on file.   Subjective:    9/27 S/P R AKA   MAP 80s-100s   Still with on and off nausea but able to eat.  Right thigh pain improved, no longer getting IV Fentanyl.  Afebrile today, WBCs trending down, 10K today.   LVAD INTERROGATION:  HeartMate 3 LVAD:   Flow 4.0 liters/min, speed 5500, power 4.3 PI 3.5.  No PI events.     Objective:    Vital Signs:   Temp:  [97.8 F (36.6 C)-99.4 F (37.4 C)] 99.4 F (37.4 C) (10/08 0304) Pulse Rate:  [65-90] 83 (10/08 0304) Resp:  [10-20] 17 (10/08 0304) BP: (98-120)/(66-97) 111/93 (10/08 0304) SpO2:  [95 %-98 %] 98 % (10/07 2012) Weight:  [84.3 kg] 84.3 kg (10/08 0304) Last BM Date: 06/03/19 Mean arterial Pressure 80s-90s   Intake/Output:   Intake/Output Summary (Last 24 hours) at 06/13/2019 0738 Last data filed at 06/12/2019 1645 Gross per 24 hour  Intake -  Output 2000 ml  Net -2000 ml     Physical Exam   General: Well appearing this am. NAD.  HEENT: Normal. Neck: Supple, JVP 7-8 cm. Carotids OK.  Cardiac:  Mechanical heart sounds with LVAD hum present.  Lungs:  CTAB, normal effort.  Abdomen:  NT, ND, no HSM. No bruits or masses. +BS  LVAD exit site: Well-healed and incorporated. Dressing dry and intact. No erythema or drainage. Stabilization device present and accurately applied. Driveline dressing changed daily per sterile technique. Extremities:  S/p R AKA, left great toe amputation.   Neuro:  Alert & oriented x 3. Cranial nerves grossly intact. Moves all 4 extremities w/o difficulty. Affect pleasant    Telemetry   NSR 80s personally reviewed.   Labs   Basic Metabolic Panel: Recent Labs  Lab 06/08/19 0426 06/09/19 0448 06/10/19 0500 06/11/19 0446 06/12/19 0527 06/12/19 1236  NA 132* 133* 131* 133*  --  129*  K 3.8 3.9 4.4 3.9  --  4.1  CL 96* 98  95* 96*  --  95*  CO2 23 23 22 25   --  21*  GLUCOSE 183* 114* 93 101*  --  206*  BUN 32* 50* 60* 33*  --  52*  CREATININE 5.50* 7.37* 9.52* 6.10*  --  8.39*  CALCIUM 8.5* 8.7* 8.7* 8.4*  --  8.1*  PHOS  --   --   --   --  7.3* 7.1*    Liver Function Tests: Recent Labs  Lab 06/12/19 1236  ALBUMIN 1.9*   No results for input(s): LIPASE, AMYLASE in the last 168 hours. No results for input(s): AMMONIA in the last 168 hours.  CBC: Recent Labs  Lab 06/09/19 0448 06/10/19 0500 06/11/19 0446 06/12/19 0527 06/13/19 0500  WBC 24.0* 23.4* 15.3* 14.0* 10.8*  HGB 7.9* 8.0* 7.9* 7.4* 10.1*  HCT 25.3* 25.9* 25.5* 24.5* 31.5*  MCV 88.5 89.0 89.2 90.4 87.7  PLT 363 375 321 333 243    INR: Recent Labs  Lab 06/09/19 0448 06/10/19 0500 06/11/19 0446 06/12/19 0527 06/13/19 0500  INR 1.6* 1.9* 2.4* 2.6* 2.8*    Other results:  EKG:    Imaging   No results found.   Medications:     Scheduled Medications: . sodium chloride   Intravenous Once  . sodium chloride   Intravenous Once  .  aspirin EC  81 mg Oral Daily  . atorvastatin  40 mg Oral q1800  . Chlorhexidine Gluconate Cloth  6 each Topical Daily  . Chlorhexidine Gluconate Cloth  6 each Topical Q0600  . Chlorhexidine Gluconate Cloth  6 each Topical Q0600  . citalopram  20 mg Oral Daily  . darbepoetin (ARANESP) injection - DIALYSIS  200 mcg Intravenous Q Wed-HD  . doxercalciferol  1 mcg Intravenous Q M,W,F-HD  . dronabinol  2.5 mg Oral BID AC  . erythromycin  250 mg Oral TID  . gabapentin  300 mg Oral BID  . hydrALAZINE  25 mg Oral 3 times per day on Sun Tue Thu Sat  . influenza vac split quadrivalent PF  0.5 mL Intramuscular Tomorrow-1000  . insulin aspart  0-9 Units Subcutaneous TID WC  . insulin glargine  10 Units Subcutaneous Daily  . metoCLOPramide  10 mg Oral TID WC  . midodrine  5 mg Oral Q M,W,F  . multivitamin  1 tablet Oral QHS  . pantoprazole  40 mg Oral BID AC  . sevelamer carbonate  0.8 g Oral TID  WC  . warfarin  5 mg Oral Once per day on Sun Mon Wed Thu Sat  . [START ON 06/14/2019] warfarin  7.5 mg Oral Once per day on Tue Fri  . Warfarin - Pharmacist Dosing Inpatient   Does not apply q1800    Infusions: . sodium chloride 10 mL/hr at 06/01/19 1200  . sodium chloride 10 mL/hr at 06/02/19 0707    PRN Medications: sodium chloride, acetaminophen, docusate sodium, hydrALAZINE, LORazepam, LORazepam, oxyCODONE, promethazine, promethazine   Assessment/Plan:    1. 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  Pain at right thigh surgical site improving, off PCA.  No further incisional bleeding.  - Concerned that he may eventually need further amputation on left, but no current plans.  L Foot looks ok with some ischemic changes around incision. Vascular has seen, plan to watch and see how things go. No additional plan for surgery this admission.   2. Chronic systolic CHF: Ischemic cardiomyopathy. St Jude ICD. NSTEMI in March 2018 with DES to LAD and RCA, complicated by cardiogenic shock, low output requiring milrinone. Echo 8/18 with EF 20-25%, moderate MR. CPX (8/18) with severe functional impairment due to HF. He is s/p HM3 LVAD placement. He had pump thrombosis 6/19 due to [facility] outflow graft kinking. He had pump exchange for another HM3. Not a candidate for transplant with PAD. Volume status managed at HD. INR 2.8 today.  - Continue ASA 81.   - Continue warfarin.   3. ESRD: HD on M-W-F with midodrine pre-HD.   4.CAD: NSTEMI in 3/18. LHC with 99% ulcerated lesion proximal RCA with left to right collaterals, 95% mid LAD stenosis after mid LAD stent. s/p PCI to RCA and LAD on 11/25/16.No chest pain.  - Continue statin and ASA. 5. h/o DVTs: Factor V Leiden heterozygote. -Anticoagulated. 6. Diabetic gastroparesis: Long history of this.  Increased nausea post-op, may be related to increased narcotic use. Still occasional nausea but  getting better. - Continue reglan 10 mg tid/ac and Marinol.  - Continue Protonix bid.  - He will continue erythromycinindefinitely.  - Will order low dose IV Ativan to help with nausea if needed.  - Limit narcotic use as much as possible. 7. HTN: Continue hydralazine 25 mg tid on non-HD days. Stable.  8. Pain: Improving, now off IV meds.  9. ID:  Chronic WBC elevation likely related to ischemic extremities. Abx expanded on 9/26 to vancomycin/meropenem, completed course on 10/2.  Blood cultures negative. WBCs trending down today and afebrile x 48 hrs.  10. Anemia: post-op anemia + anemia of renal disease.  He got 1 unit PRBCs with HD on 9/30 and again on 10/2.  Hgb 10.1 today, stable.  No further bleeding noted at surgical site.   - At this point, will transfuse hgb < 7.  11. Disposition: Still having trouble with his insurance. He is not mobile enough to go home and too complicated for SNF to accept. Will talk with his insurance again today about [facility].  He is ready for [facility] if insurance will allow.   Length of Stay: Gerty, MD 06/13/2019, 7:38 AM  VAD Team --- VAD ISSUES ONLY--- Pager [phone] (7am - 7am)  Advanced Heart Failure Team  Pager [phone] (M-F; 7a - 4p)  Please contact [facility] for night-coverage after hours (4p -7a ) and weekends on [email or web address]

## 2019-06-13 NOTE — Progress Notes (Signed)
LVAD Coordinator Rounding Note:  Admitted 05/28/19 per Dr. Aundra Dubin for heparin bridge for left toe amputation.    HM III LVAD implanted on 06/12/17 by Dr. Darcey Nora under Destination Therapy criteria. Pump exchange performed on 03/01/18 per Dr. Darcey Nora for pump thrombus.   Pt lying in bed, he is in good spirits today. Denies any complaints today. Pt was excited to hear the news that his insurance has approved [facility].  Vital signs: Temp: 98.6 HR: 80 Doppler Pressure:  84 Automatic BP:  102/82 (89) O2 Sat: 97% RA  Wt: 184>186.9>184.5>179.4>181.2>188>183.8>185.8 lbs   LVAD interrogation reveals:  Speed: 5500 Flow: 4.1 Power: 4.4w PI: 3.3 Alarms: none Events:  none Hematocrit: 31  Fixed speed: 5500 Low speed limit: 5200   Drive Line: Dressing C/D/I; anchor intact and accurately applied. Weekly dressing changes per bedside nurse; next dressing change due 06/16/19.  Labs:  LDH trend: 200>186>156>159>174>155>158>164>167>159>150>186  INR trend: 2.5>2.3>1.9>1.8>1.7>1.7>1.7>2.0>2.1>1.9>2.4>2.6>2.8  Anticoagulation Plan: -INR Goal: 2.0 - 3.0 -ASA Dose: 81 mg daily   Device: St Jude dual ICD - Therapies: on 200 bpm - Last check: 01/01/18 - 2 hrs Afl/Afib; EMI 04/02/18  Blood: 06/05/19> 1 PRBC 06/07/19> 1 unit PRBC  Infection:  - BC x 2 on 05/30/19>>no growth final - BC x 2 on 06/12/19>>pending  Plan/Recommendations:  1. Call VAD pager if any questions about VAD equipment or drive line. 2. Awaiting ICU bed at [place].  [name] RN [place] Coordinator  Office: [phone]  24/7 Pager: [phone]

## 2019-06-13 NOTE — Progress Notes (Signed)
CSW received word that patient has been approved for admission to [facility]. CSW met at bedside with patient who is pleased and understands that no bed is available at this time. CSW spoke with Roque Cash who will inform MD and team once bed available. CSW will follow as needed along with VAD team. [name], [place], [place]

## 2019-06-13 NOTE — Plan of Care (Signed)
°  Problem: Clinical Measurements: °Goal: Cardiovascular complication will be avoided °Outcome: Progressing °  °Problem: Activity: °Goal: Risk for activity intolerance will decrease °Outcome: Progressing °  °

## 2019-06-13 NOTE — Progress Notes (Signed)
Initial Nutrition Assessment  DOCUMENTATION CODES:   Not applicable  INTERVENTION:   - Last BM documented on 9/28. Recommend initiating a bowel regimen.  - Nepro Shake po BID, each supplement provides 425 kcal and 19 grams protein  - Pro-stat 30 ml po BID, each supplement provides 100 kcals and 15 grams protein  - Continue rena-vit daily  NUTRITION DIAGNOSIS:   Increased nutrient needs related to post-op healing, chronic illness as evidenced by estimated needs.  GOAL:   Patient will meet greater than or equal to 90% of their needs  MONITOR:   PO intake, Supplement acceptance, Weight trends, Skin, I & O's  REASON FOR ASSESSMENT:   LOS    ASSESSMENT:   53 year old male who presented on 9/22. PMH of CAD s/p anterior MI in 2010 and NSTEMI in 3/18 with DES to pRCA and mLAD, gastroparesis, ischemic cardiomyopathy now s/p Heartmate 3 LVAD, ESRD on HD, factor V leiden, PAF, and ischemic foot. Admitted 7/10 with ischemic right foot. VVS consulted and on 7/13 he underwent peripheral angiogram that showed 80% stenosis r SFV and occluded right PT and had stent to right SFA and angioplasty to PT. He then had 4th toe amputation. He was discharged to home on 03/22/19. Admitted 03/27/19 with recurrent n/v, leukocytosis and ischemic changes in right foot. Admit ICU for treatment of severe gastroparesis and possible ischemic foot. Pt was treated with IV vancomycin for possible osteomyelitis/infection, but thought that more likely ischemia could explain all the findings. He was discharged home to follow-up with vascular surgery.   9/23 - s/p amputation of left great toe 9/27 - s/p right AKA  RD visited pt at bedside. Pt sleeping soundly but awoke briefly to RD voice. Pt lethargic and unable to answer RD questions appropriately. Pt continued to state "sounds good" whenever RD asked a question. Pt did not open his eyes.  Given variable PO intake, RD will order oral nutrition supplements to aid pt  in meeting kcal and protein needs and promote wound healing.  Weight down over the last month. Suspect this weight loss is related to AKA.  Meal Completion: 10-100% x last 8 recorded meals  Medications reviewed and include: Hectorol, Marinol, SSI, Lantus 10 units daily, Reglan, rena-vit, Protonix, Renvela TID, warfarin  Labs reviewed: sodium 128, chloride 94 CBG's: 113-191 x 24 hours  HD net UF 10/7: 2000 ml x 24 hours I/O's: +3.0 L since admit  NUTRITION - FOCUSED PHYSICAL EXAM:  Deferred.  Diet Order:   Diet Order            Diet renal/carb modified with fluid restriction Diet-HS Snack? Nothing; Fluid restriction: 1200 mL Fluid; Room service appropriate? Yes; Fluid consistency: Thin  Diet effective now              EDUCATION NEEDS:   Not appropriate for education at this time  Skin:  Skin Assessment: Skin Integrity Issues: Skin Integrity Issues: Incisions: left leg, right leg  Last BM:  06/03/19  Height:   Ht Readings from Last 1 Encounters:  06/02/19 5' 7.99" (1.727 m)    Weight:   Wt Readings from Last 1 Encounters:  06/13/19 84.3 kg   EDW: 84 kg  Ideal Body Weight:  64.4 kg (adjusted for R AKA)   BMI:  Body mass index is 28.26 kg/m.  Estimated Nutritional Needs:   Kcal:  2100-2300  Protein:  115-130 grams  Fluid:  1000 ml + UOP    Gaynell Face, MS, RD, LDN Inpatient  Clinical Dietitian Pager: [phone] Weekend/After Hours: [phone]

## 2019-06-13 NOTE — Progress Notes (Signed)
[facility] on and off.   Objective (!) 118/91 86 99 F (37.2 C) (Oral) 16 95%  Intake/Output Summary (Last 24 hours) at 06/13/2019 1104 Last data filed at 06/13/2019 1000 Gross per 24 hour  Intake 240 ml  Output 2000 ml  Net -1760 ml    Left foot amputation site without change, incision with darkening skin line.  Left foot and leg warm to touch.  Left 4th toe wound unchanged. Left Peroneal doppler signal Right AKA healing well with intact staples, skin warm to touch  Assessment/Planning: POD#11 53 y.o. male is s/p right above knee amputation  POD# 14 left GT amputation Will cont to let left foot demarcate at incision line. Plan for home with home health.    Roxy Horseman 06/13/2019 11:04 AM --  Laboratory Lab Results: Recent Labs    06/12/19 0527 06/13/19 0500  WBC 14.0* 10.8*  HGB 7.4* 10.1*  HCT 24.5* 31.5*  PLT 333 243   BMET Recent Labs    06/12/19 1236 06/13/19 0900  NA 129* 128*  K 4.1 3.8  CL 95* 94*  CO2 21* 25  GLUCOSE 206* 201*  BUN 52* 37*  CREATININE 8.39* 5.95*  CALCIUM 8.1* 7.9*    COAG Lab Results  Component Value Date   INR 2.8 (H) 06/13/2019   INR 2.6 (H) 06/12/2019   INR 2.4 (H) 06/11/2019   No results found for: PTT

## 2019-06-13 NOTE — Progress Notes (Signed)
Occupational Therapy Treatment Patient Details Name: [name] MRN: [id number] DOB: [birth year] Today's Date: 06/13/2019    History of present illness Pt is a 53 y.o. male with hx of LVAD, ESRD, DM, CAD, HLD, s/p right TMA on 04/30/2019 for non-healing amputation site (previous R 4th ray amp 03/21/19); pt now admitted 05/28/19 with difficulty walking due to R foot pain. Now s/p L great toe amputation 9/23. S/p R AKA 9/27.   OT comments  Pt making steady progress. Able to complete bed level ADL with Mod A for LB and set up for UB bathing. Used slideboard transfer to drop arm recliner with min A. Feel pt would benefit form post acute rehab at SNF to facilitate safe DC home with family. Discussed with treatment team. Will continue ot follow acutely.   Follow Up Recommendations  SNF    Equipment Recommendations  None recommended by OT    Recommendations for Other Services      Precautions / Restrictions Precautions Precautions: Fall;Other (comment) Precaution Comments: LVAD; R AKA Restrictions Weight Bearing Restrictions: Yes RLE Weight Bearing: Non weight bearing LLE Weight Bearing: Partial weight bearing LLE Partial Weight Bearing Percentage or Pounds: L foot WB thru heel in darco shoe       Mobility Bed Mobility Overal bed mobility: Needs Assistance Bed Mobility: Supine to Sit     Supine to sit: Min assist;HOB elevated        Transfers Overall transfer level: Needs assistance Equipment used: Sliding board            Lateral/Scoot Transfers: Min assist;With slide board General transfer comment: Assist to place slide board prior to transfer, pt able to remove with cues post-transfer; min guard to perform lateral scoot to drop arm with board, pt cued for sequencing/technique; pt reports more ease using board due to BUE weakness    Balance Overall balance assessment: Needs assistance Sitting-balance support: No upper extremity supported;Feet supported Sitting  balance-[name]: Fair                                     ADL either performed or assessed with clinical judgement   ADL Overall ADL's : Needs assistance/impaired     Grooming: Set up;Sitting   Upper Body Bathing: Set up;Sitting   Lower Body Bathing: Moderate assistance;Bed level   Upper Body Dressing : Set up;Sitting   Lower Body Dressing: Moderate assistance;Sitting/lateral leans;Bed level     Toilet Transfer Details (indicate cue type and reason): Needs drop arm BSC; able to use slide board to scoot to recliner with min A         Functional mobility during ADLs: Minimal assistance(slide board)       Vision       Perception     Praxis      Cognition Arousal/Alertness: Awake/alert Behavior During Therapy: WFL for tasks assessed/performed;Flat affect Overall Cognitive Status: Within Functional Limits for tasks assessed                                 General Comments: slow processing; most likely baseline        Exercises     Shoulder Instructions       General Comments Increased time discussing home set-up, DME needs and assist available from family. Pt states kids working on getting ramp installed into home but does  not have at this time; unsure about transportation to dialysis    Pertinent Vitals/ Pain       Pain Assessment: Faces Faces Pain Scale: Hurts a little bit Pain Location: L foot in dependent position Pain Descriptors / Indicators: Sore;Discomfort Pain Intervention(s): Limited activity within patient's tolerance  Home Living                                          Prior Functioning/Environment              Frequency  Min 2X/week        Progress Toward Goals  OT Goals(current goals can now be found in the care plan section)  Progress towards OT goals: Progressing toward goals;Goals met and updated - see care plan  Acute Rehab OT Goals Patient Stated Goal: to go home  OT  Goal Formulation: With patient Time For Goal Achievement: 06/27/19 Potential to Achieve Goals: Good ADL Goals Pt Will Perform Upper Body Dressing: with modified independence;sitting Pt Will Perform Lower Body Dressing: with min assist;sitting/lateral leans Pt Will Transfer to Toilet: with supervision;bedside commode Pt Will Perform Toileting - Clothing Manipulation and hygiene: with supervision;sitting/lateral leans Pt/caregiver will Perform Home Exercise Program: Increased strength;Both right and left upper extremity;With theraband;Independently Additional ADL Goal #1: Pt will perform bed mobility at supervision level prior to engaging in ADL  Plan Discharge plan needs to be updated    Co-evaluation    PT/OT/SLP Co-Evaluation/Treatment: Yes Reason for Co-Treatment: Complexity of the patient's impairments (multi-system involvement);To address functional/ADL transfers PT goals addressed during session: Mobility/safety with mobility;Proper use of DME;Balance OT goals addressed during session: ADL's and self-care;Strengthening/ROM      AM-PAC OT "6 Clicks" Daily Activity     Outcome Measure   Help from another person eating meals?: None Help from another person taking care of personal grooming?: A Little Help from another person toileting, which includes using toliet, bedpan, or urinal?: A Lot Help from another person bathing (including washing, rinsing, drying)?: A Lot Help from another person to put on and taking off regular upper body clothing?: A Little Help from another person to put on and taking off regular lower body clothing?: A Lot 6 Click Score: 16    End of Session Equipment Utilized During Treatment: Gait belt  OT Visit Diagnosis: Other abnormalities of gait and mobility (R26.89);Muscle weakness (generalized) (M62.81);Pain Pain - Right/Left: Left Pain - part of body: Ankle and joints of foot   Activity Tolerance Patient tolerated treatment well   Patient Left in  chair;with call bell/phone within reach   Nurse Communication Mobility status;Other (comment)(use of lateral scoot transfer)        Time: 1035-1105 OT Time Calculation (min): 30 min  Charges: OT General Charges $OT Visit: 1 Visit OT Treatments $Self Care/Home Management : 8-22 mins  Maurie Boettcher, OT/L   Acute OT Clinical Specialist [place] Pager [phone] Office [phone]    [facility] 06/13/2019, 1:08 PM

## 2019-06-14 LAB — GLUCOSE, CAPILLARY
Glucose-Capillary: 128 mg/dL — ABNORMAL HIGH (ref 70–99)
Glucose-Capillary: 134 mg/dL — ABNORMAL HIGH (ref 70–99)
Glucose-Capillary: 154 mg/dL — ABNORMAL HIGH (ref 70–99)
Glucose-Capillary: 190 mg/dL — ABNORMAL HIGH (ref 70–99)
Glucose-Capillary: 113 mg/dL — ABNORMAL HIGH (ref 70–99)

## 2019-06-14 LAB — RENAL FUNCTION PANEL
Albumin: 1.9 g/dL — ABNORMAL LOW (ref 3.5–5.0)
Anion gap: 14 (ref 5–15)
BUN: 51 mg/dL — ABNORMAL HIGH (ref 6–20)
CO2: 22 mmol/L (ref 22–32)
Calcium: 8.4 mg/dL — ABNORMAL LOW (ref 8.9–10.3)
Chloride: 91 mmol/L — ABNORMAL LOW (ref 98–111)
Creatinine, Ser: 7.37 mg/dL — ABNORMAL HIGH (ref 0.61–1.24)
GFR calc Af Amer: 9 mL/min — ABNORMAL LOW (ref >60)
GFR calc non Af Amer: 8 mL/min — ABNORMAL LOW (ref >60)
Glucose, Bld: 142 mg/dL — ABNORMAL HIGH (ref 70–99)
Phosphorus: 5.4 mg/dL — ABNORMAL HIGH (ref 2.5–4.6)
Potassium: 4.3 mmol/L (ref 3.5–5.1)
Sodium: 127 mmol/L — ABNORMAL LOW (ref 135–145)

## 2019-06-14 LAB — CBC
HCT: 24.5 % — ABNORMAL LOW (ref 39.0–52.0)
Hemoglobin: 7.4 g/dL — ABNORMAL LOW (ref 13.0–17.0)
MCH: 26.9 pg (ref 26.0–34.0)
MCHC: 30.2 g/dL (ref 30.0–36.0)
MCV: 89.1 fL (ref 80.0–100.0)
Platelets: 318 10*3/uL (ref 150–400)
RBC: 2.75 MIL/uL — ABNORMAL LOW (ref 4.22–5.81)
RDW: 17.2 % — ABNORMAL HIGH (ref 11.5–15.5)
WBC: 13.9 10*3/uL — ABNORMAL HIGH (ref 4.0–10.5)
nRBC: 0.2 % (ref 0.0–0.2)

## 2019-06-14 LAB — PROTIME-INR
INR: 2.6 — ABNORMAL HIGH (ref 0.8–1.2)
Prothrombin Time: 27.7 seconds — ABNORMAL HIGH (ref 11.4–15.2)

## 2019-06-14 LAB — LACTATE DEHYDROGENASE: LDH: 168 U/L (ref 98–192)

## 2019-06-14 MED ORDER — PROMETHAZINE HCL 25 MG/ML IJ SOLN
INTRAMUSCULAR | Status: AC
  Administered 2019-06-14: 12.5 mg via INTRAVENOUS
  Filled 2019-06-14: qty 1

## 2019-06-14 MED ORDER — HEPARIN SODIUM (PORCINE) 1000 UNIT/ML IJ SOLN
INTRAMUSCULAR | Status: AC
  Administered 2019-06-14: 3400 [IU]
  Filled 2019-06-14: qty 4

## 2019-06-14 MED ORDER — DOXERCALCIFEROL 4 MCG/2ML IV SOLN
INTRAVENOUS | Status: AC
  Filled 2019-06-14: qty 2

## 2019-06-14 MED ORDER — MIDODRINE HCL 5 MG PO TABS
ORAL_TABLET | ORAL | Status: AC
  Administered 2019-06-14: 5 mg via ORAL
  Filled 2019-06-14: qty 1

## 2019-06-14 NOTE — Progress Notes (Signed)
[facility] Progress Note   Assessment/ Plan:   OP Dialysis:[facility] MWF 4h 37mn 400/800 84kg 2K/3Ca Hep none TDC Venofer 10770mIV x 5 until 9/28 Mircera 200 mcg IV q 2 weeks (last 9/16)  Hectorol 70m77mIV TIW  Problem/Plan: # PAD bilateral LE's: SP L great toe amp on 9/23. 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 Per VVS.  Bloody drainage resolved, close to d/c.  Off antibiotics at this time, WBC down, blood cultures collected 10/6 d/t low-grade fever of 100.8, NGTD  # ESRD -HD MWF.On schedule 10/7.  Pt requests 1st shift, next planned 10/9  # Hypertension/volume - No volume excess on exam but Na is low indicating need for healthier UF goals.  Will increase UF goal todayWill need new EDW post amputation.   # Anemia- low,last outpt Mircera on 9/16.Got darbe 200 ug here on 9/30and ordered weekly wed hd . Rec'd 1 unit prbc's 9/30 and to get 2nd prbc 10/2.   # Metabolic bone disease -lowered VDRA d/t high corrected Ca, phos above goal- pt asking about different binders-will try renvela powder pack TID AC--> doing well with that  # CAD/ICM s/p LVAD - per HF team   # Dispo: [facility] approved now- from renal perspective can go anytime  Subjective:    On HD this AM- doing well. Nausea improved.   Has been approved [facility].    Objective:   BP (!) 121/94   Pulse 88   Temp 98.2 F (36.8 C) (Oral)   Resp 20   Ht 5' 7.99" (1.727 m)   Wt 86.5 kg   SpO2 98%   BMI 29.00 kg/m   Physical Exam: Gen: NAD, appears well CVS: mechanical hum heard throughout the precordium Resp: RRR  Abd: obese Ext: s/p R AKA, L great toe amp, dusky edges, really unchanged ACCESS: L IJ TDC  Labs: BMET Recent Labs  Lab 06/08/19 0426 06/09/19 0448 06/10/19 0500 06/11/19 0446 06/12/19 0527 06/12/19 1236 06/13/19 0900 06/14/19 0400  NA 132* 133* 131* 133*  --  129* 128* 127*  K 3.8 3.9 4.4 3.9  --  4.1 3.8 4.3  CL 96* 98 95* 96*  --  95* 94* 91*   CO2 23 23 22 25   --  21* 25 22  GLUCOSE 183* 114* 93 101*  --  206* 201* 142*  BUN 32* 50* 60* 33*  --  52* 37* 51*  CREATININE 5.50* 7.37* 9.52* 6.10*  --  8.39* 5.95* 7.37*  CALCIUM 8.5* 8.7* 8.7* 8.4*  --  8.1* 7.9* 8.4*  PHOS  --   --   --   --  7.3* 7.1*  --  5.4*   CBC Recent Labs  Lab 06/11/19 0446 06/12/19 0527 06/13/19 0500 06/14/19 0400  WBC 15.3* 14.0* 10.8* 13.9*  HGB 7.9* 7.4* 10.1* 7.4*  HCT 25.5* 24.5* 31.5* 24.5*  MCV 89.2 90.4 87.7 89.1  PLT 321 333 243 318    @IMGRELPRIORS @ Medications:    . sodium chloride   Intravenous Once  . sodium chloride   Intravenous Once  . aspirin EC  81 mg Oral Daily  . atorvastatin  40 mg Oral q1800  . Chlorhexidine Gluconate Cloth  6 each Topical Daily  . Chlorhexidine Gluconate Cloth  6 each Topical Q0600  . Chlorhexidine Gluconate Cloth  6 each Topical Q0600  . citalopram  20 mg Oral Daily  . darbepoetin (ARANESP) injection - DIALYSIS  200 mcg Intravenous Q Wed-HD  . doxercalciferol  1 mcg Intravenous Q M,W,F-HD  . dronabinol  2.5 mg Oral BID AC  . erythromycin  250 mg Oral TID  . feeding supplement (NEPRO CARB STEADY)  237 mL Oral BID BM  . feeding supplement (PRO-STAT SUGAR FREE 64)  30 mL Oral BID  . gabapentin  300 mg Oral BID  . hydrALAZINE  25 mg Oral 3 times per day on Sun Tue Thu Sat  . influenza vac split quadrivalent PF  0.5 mL Intramuscular Tomorrow-1000  . insulin aspart  0-9 Units Subcutaneous TID WC  . insulin glargine  10 Units Subcutaneous Daily  . metoCLOPramide  10 mg Oral TID WC  . midodrine  5 mg Oral Q M,W,F  . multivitamin  1 tablet Oral QHS  . pantoprazole  40 mg Oral BID AC  . sevelamer carbonate  0.8 g Oral TID WC  . warfarin  5 mg Oral Once per day on Sun Mon Wed Thu Sat  . warfarin  7.5 mg Oral Once per day on Tue Fri  . Warfarin - Pharmacist Dosing Inpatient   Does not apply q1800     Madelon Lips, MD 06/14/2019, 9:02 AM

## 2019-06-14 NOTE — Progress Notes (Signed)
ANTICOAGULATION CONSULT NOTE - Follow Up Consult  Pharmacy Consult for warfarin Indication: LVAD HM3  Allergies  Allergen Reactions  . Metformin And Related Diarrhea    Patient Measurements: Height: 5' 7.99" (172.7 cm) Weight: 190 lb 11.2 oz (86.5 kg) IBW/kg (Calculated) : 68.38   Vital Signs: Temp: 98.2 F (36.8 C) (10/09 0645) Temp Source: Oral (10/09 0645) BP: 120/87 (10/09 0743) Pulse Rate: 80 (10/09 0743)  Labs: Recent Labs    06/12/19 0527 06/12/19 1236 06/13/19 0500 06/13/19 0900 06/14/19 0400  HGB 7.4*  --  10.1*  --  7.4*  HCT 24.5*  --  31.5*  --  24.5*  PLT 333  --  243  --  318  LABPROT 27.4*  --  28.7*  --  27.7*  INR 2.6*  --  2.8*  --  2.6*  CREATININE  --  8.39*  --  5.95* 7.37*    Estimated Creatinine Clearance: 12.3 mL/min (A) (by C-G formula based on SCr of 7.37 mg/dL (H)).   Medical History: Past Medical History:  Diagnosis Date  . Angina   . ASCVD (arteriosclerotic cardiovascular disease)    , Anterior infarction 2005, LAD diagonal bifurcation intervention 03/2004  . Automatic implantable cardiac defibrillator -St. Jude's       . Benign neoplasm of colon   . CHF (congestive heart failure) ([place])   . Chronic systolic heart failure ([place])   . Coronary artery disease     Widely patent previously placed stents in the left anterior   . Crohn's disease ([place])   . Deep venous thrombosis ([facility])    Recurrent-on Coumadin  . Dialysis patient ([place])   . Dyspnea   . Gastroparesis   . GERD (gastroesophageal reflux disease)   . High cholesterol   . Hyperlipidemia   . Hypersomnolent    Previous diagnosis of narcolepsy  . Hypertension, essential   . Ischemic cardiomyopathy    Ejection fraction 15-20% catheterization 2010  . Type II or unspecified type diabetes mellitus without mention of complication, not stated as uncontrolled   . Unspecified gastritis and gastroduodenitis without mention of hemorrhage     Assessment: 49 yoM on warfarin PTA  for LVAD and hx FV Leiden admitted for ongoing foot ischemia. 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 Pharmacy dosing warfarin. Heparin off 10/5.  -INR 2.6, hemoglobin down 7.4, pltc wnl Nausea/emesis comes and goes - usually feels bad post HD. Pain controlled with gabapentin and prn oxy.  PTA warfarin 7.475m Tu/Fr 569mQOD  Goal of Therapy:  INR 2-3  Monitor platelets by anticoagulation protocol: Yes   Plan:  Warfarin 75m31mWThuSS and 7.75mg52mFr Continue PTA dosing  MegaVertis KelcharmD PGY2 Cardiology Pharmacy Resident Phone ([phone]       8:03 AM  Please check [email or web address] for unit-specific pharmacist phone numbers

## 2019-06-14 NOTE — Plan of Care (Signed)
  Problem: Health Behavior/Discharge Planning: Goal: Ability to manage health-related needs will improve Outcome: Progressing   Problem: Clinical Measurements: Goal: Ability to maintain clinical measurements within normal limits will improve Outcome: Progressing Goal: Will remain free from infection Outcome: Progressing Goal: Diagnostic test results will improve Outcome: Progressing Goal: Respiratory complications will improve Outcome: Progressing Goal: Cardiovascular complication will be avoided Outcome: Progressing   Problem: Activity: Goal: Risk for activity intolerance will decrease Outcome: Progressing   Problem: Nutrition: Goal: Adequate nutrition will be maintained Outcome: Progressing   Problem: Coping: Goal: Level of anxiety will decrease Outcome: Progressing   Problem: Elimination: Goal: Will not experience complications related to bowel motility Outcome: Progressing   Problem: Pain Managment: Goal: General experience of comfort will improve Outcome: Progressing   Problem: Safety: Goal: Ability to remain free from injury will improve Outcome: Progressing   Problem: Skin Integrity: Goal: Risk for impaired skin integrity will decrease Outcome: Progressing   Problem: Education: Goal: Patient will understand all VAD equipment and how it functions Outcome: Progressing Goal: Patient will be able to verbalize current INR target range and antiplatelet therapy for discharge home Outcome: Progressing   Problem: Cardiac: Goal: LVAD will function as expected and patient will experience no clinical alarms Outcome: Progressing   Problem: Education: Goal: Knowledge of the prescribed therapeutic regimen will improve Outcome: Progressing Goal: Ability to verbalize activity precautions or restrictions will improve Outcome: Progressing Goal: Understanding of discharge needs will improve Outcome: Progressing   Problem: Activity: Goal: Ability to perform//tolerate  increased activity and mobilize with assistive devices will improve Outcome: Progressing   Problem: Clinical Measurements: Goal: Postoperative complications will be avoided or minimized Outcome: Progressing   Problem: Self-Care: Goal: Ability to meet self-care needs will improve Outcome: Progressing   Problem: Self-Concept: Goal: Ability to maintain and perform role responsibilities to the fullest extent possible will improve Outcome: Progressing   Problem: Pain Management: Goal: Pain level will decrease with appropriate interventions Outcome: Progressing   Problem: Skin Integrity: Goal: Demonstration of wound healing without infection will improve Outcome: Progressing

## 2019-06-14 NOTE — Procedures (Signed)
Patient seen and examined on Hemodialysis. BP (!) 121/94   Pulse 88   Temp 98.2 F (36.8 C) (Oral)   Resp 20   Ht 5' 7.99" (1.727 m)   Wt 86.5 kg   SpO2 98%   BMI 29.00 kg/m   QB 400 mL/ min via TDC, UF goal 2.5-->3L  Tolerating treatment without complaints at this time.  Madelon Lips MD [facility] pgr [phone] 9:06 AM

## 2019-06-14 NOTE — Progress Notes (Signed)
Patient ID: [name], male   DOB: [birth year], 53 y.o.   MRN: [id number]   Advanced Heart Failure VAD Team Note  PCP-Cardiologist: No primary care provider on file.   Subjective:    9/27 S/P R AKA   MAP 80s-90s.    No nausea today.  Right thigh pain improved, no longer getting IV Fentanyl.  Afebrile today, WBCs have generally trended lower.   LVAD INTERROGATION:  HeartMate 3 LVAD:   Flow 4.0 liters/min, speed 5500, power 4.2 PI 4.3.  No PI events.     Objective:    Vital Signs:   Temp:  [98.2 F (36.8 C)-98.6 F (37 C)] 98.2 F (36.8 C) (10/09 0645) Pulse Rate:  [80-89] 81 (10/09 0815) Resp:  [13-19] 18 (10/09 0815) BP: (95-124)/(75-111) 111/97 (10/09 0815) SpO2:  [97 %-99 %] 98 % (10/09 0645) Weight:  [86.5 kg] 86.5 kg (10/09 0645) Last BM Date: 06/03/19 Mean arterial Pressure 80s-90s   Intake/Output:   Intake/Output Summary (Last 24 hours) at 06/14/2019 0830 Last data filed at 06/13/2019 1519 Gross per 24 hour  Intake 720 ml  Output -  Net 720 ml     Physical Exam   General: Well appearing this am. NAD.  HEENT: Normal. Neck: Supple, JVP 7-8 cm. Carotids OK.  Cardiac:  Mechanical heart sounds with LVAD hum present.  Lungs:  CTAB, normal effort.  Abdomen:  NT, ND, no HSM. No bruits or masses. +BS  LVAD exit site: Well-healed and incorporated. Dressing dry and intact. No erythema or drainage. Stabilization device present and accurately applied. Driveline dressing changed daily per sterile technique. Extremities:  S/p right AKA, left great toe amputation. Neuro:  Alert & oriented x 3. Cranial nerves grossly intact. Moves all 4 extremities w/o difficulty. Affect pleasant     Telemetry   NSR 80s personally reviewed.   Labs   Basic Metabolic Panel: Recent Labs  Lab 06/10/19 0500 06/11/19 0446 06/12/19 0527 06/12/19 1236 06/13/19 0900 06/14/19 0400  NA 131* 133*  --  129* 128* 127*  K 4.4 3.9  --  4.1 3.8 4.3  CL 95* 96*  --  95* 94* 91*  CO2  22 25  --  21* 25 22  GLUCOSE 93 101*  --  206* 201* 142*  BUN 60* 33*  --  52* 37* 51*  CREATININE 9.52* 6.10*  --  8.39* 5.95* 7.37*  CALCIUM 8.7* 8.4*  --  8.1* 7.9* 8.4*  PHOS  --   --  7.3* 7.1*  --  5.4*    Liver Function Tests: Recent Labs  Lab 06/12/19 1236 06/14/19 0400  ALBUMIN 1.9* 1.9*   No results for input(s): LIPASE, AMYLASE in the last 168 hours. No results for input(s): AMMONIA in the last 168 hours.  CBC: Recent Labs  Lab 06/10/19 0500 06/11/19 0446 06/12/19 0527 06/13/19 0500 06/14/19 0400  WBC 23.4* 15.3* 14.0* 10.8* 13.9*  HGB 8.0* 7.9* 7.4* 10.1* 7.4*  HCT 25.9* 25.5* 24.5* 31.5* 24.5*  MCV 89.0 89.2 90.4 87.7 89.1  PLT 375 321 333 243 318    INR: Recent Labs  Lab 06/10/19 0500 06/11/19 0446 06/12/19 0527 06/13/19 0500 06/14/19 0400  INR 1.9* 2.4* 2.6* 2.8* 2.6*    Other results:  EKG:    Imaging   No results found.   Medications:     Scheduled Medications: . sodium chloride   Intravenous Once  . sodium chloride   Intravenous Once  . aspirin EC  81 mg Oral  Daily  . atorvastatin  40 mg Oral q1800  . Chlorhexidine Gluconate Cloth  6 each Topical Daily  . Chlorhexidine Gluconate Cloth  6 each Topical Q0600  . Chlorhexidine Gluconate Cloth  6 each Topical Q0600  . citalopram  20 mg Oral Daily  . darbepoetin (ARANESP) injection - DIALYSIS  200 mcg Intravenous Q Wed-HD  . doxercalciferol  1 mcg Intravenous Q M,W,F-HD  . dronabinol  2.5 mg Oral BID AC  . erythromycin  250 mg Oral TID  . feeding supplement (NEPRO CARB STEADY)  237 mL Oral BID BM  . feeding supplement (PRO-STAT SUGAR FREE 64)  30 mL Oral BID  . gabapentin  300 mg Oral BID  . hydrALAZINE  25 mg Oral 3 times per day on Sun Tue Thu Sat  . influenza vac split quadrivalent PF  0.5 mL Intramuscular Tomorrow-1000  . insulin aspart  0-9 Units Subcutaneous TID WC  . insulin glargine  10 Units Subcutaneous Daily  . metoCLOPramide  10 mg Oral TID WC  . midodrine  5 mg  Oral Q M,W,F  . multivitamin  1 tablet Oral QHS  . pantoprazole  40 mg Oral BID AC  . sevelamer carbonate  0.8 g Oral TID WC  . warfarin  5 mg Oral Once per day on Sun Mon Wed Thu Sat  . warfarin  7.5 mg Oral Once per day on Tue Fri  . Warfarin - Pharmacist Dosing Inpatient   Does not apply q1800    Infusions: . sodium chloride 10 mL/hr at 06/01/19 1200  . sodium chloride 10 mL/hr at 06/02/19 0707    PRN Medications: sodium chloride, acetaminophen, docusate sodium, hydrALAZINE, LORazepam, LORazepam, oxyCODONE, promethazine, promethazine   Assessment/Plan:    1. 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  Pain at right thigh surgical site improving, off PCA.  No further incisional bleeding for a couple of days now.  - Concerned that he may eventually need further amputation on left, but no current plans.  L Foot looks ok with some ischemic changes around incision. Vascular has seen, plan to watch and see how things go. No additional plan for surgery this admission.   2. Chronic systolic CHF: Ischemic cardiomyopathy. St Jude ICD. NSTEMI in March 2018 with DES to LAD and RCA, complicated by cardiogenic shock, low output requiring milrinone. Echo 8/18 with EF 20-25%, moderate MR. CPX (8/18) with severe functional impairment due to HF. He is s/p HM3 LVAD placement. He had pump thrombosis 6/19 due to [facility] outflow graft kinking. He had pump exchange for another HM3. Not a candidate for transplant with PAD. Volume status managed at HD. INR 2.8 today.  - Continue ASA 81.   - Continue warfarin.   3. ESRD: HD on M-W-F with midodrine pre-HD.   4.CAD: NSTEMI in 3/18. LHC with 99% ulcerated lesion proximal RCA with left to right collaterals, 95% mid LAD stenosis after mid LAD stent. s/p PCI to RCA and LAD on 11/25/16.No chest pain.  - Continue statin and ASA. 5. h/o DVTs: Factor V Leiden heterozygote. -Anticoagulated. 6. Diabetic  gastroparesis: Long history of this.  Increased nausea post-op, may be related to increased narcotic use. Still occasional nausea but getting better. - Continue reglan 10 mg tid/ac and Marinol.  - Continue Protonix bid.  - He will continue erythromycinindefinitely.  - Will order low dose IV Ativan to help with nausea if needed.  - Limit narcotic use as much as  possible. 7. HTN: Continue hydralazine 25 mg tid on non-HD days. Stable.  8. Pain: Improving, now off IV meds.  9. ID: Chronic WBC elevation likely related to ischemic extremities. Abx expanded on 9/26 to vancomycin/meropenem, completed course on 10/2.  Blood cultures negative. Overall WBCs have trended lower and he remains afebrile.   10. Anemia: post-op anemia + anemia of renal disease.  He got 1 unit PRBCs with HD on 9/30 and again on 10/2.  I think hgb 10 yesterday was spurious based on prior trend, suspect current 7.4 is stable.  He is not bleeding anymore at this surgical site and has had no overt GI bleeding.  - At this point, will transfuse hgb < 7.  11. Disposition: He has been approved for [facility] and can go to [facility] when a HD bed becomes available.    Length of Stay: 17  Loralie Champagne, MD 06/14/2019, 8:30 AM  VAD Team --- VAD ISSUES ONLY--- Pager [phone] (7am - 7am)  Advanced Heart Failure Team  Pager [phone] (M-F; 7a - 4p)  Please contact [facility] for night-coverage after hours (4p -7a ) and weekends on [email or web address]

## 2019-06-15 LAB — RENAL FUNCTION PANEL
Albumin: 1.9 g/dL — ABNORMAL LOW (ref 3.5–5.0)
Anion gap: 12 (ref 5–15)
BUN: 35 mg/dL — ABNORMAL HIGH (ref 6–20)
CO2: 27 mmol/L (ref 22–32)
Calcium: 8.4 mg/dL — ABNORMAL LOW (ref 8.9–10.3)
Chloride: 96 mmol/L — ABNORMAL LOW (ref 98–111)
Creatinine, Ser: 4.95 mg/dL — ABNORMAL HIGH (ref 0.61–1.24)
GFR calc Af Amer: 14 mL/min — ABNORMAL LOW (ref >60)
GFR calc non Af Amer: 12 mL/min — ABNORMAL LOW (ref >60)
Glucose, Bld: 90 mg/dL (ref 70–99)
Phosphorus: 4.5 mg/dL (ref 2.5–4.6)
Potassium: 3.7 mmol/L (ref 3.5–5.1)
Sodium: 135 mmol/L (ref 135–145)

## 2019-06-15 LAB — CBC
HCT: 26.4 % — ABNORMAL LOW (ref 39.0–52.0)
Hemoglobin: 7.9 g/dL — ABNORMAL LOW (ref 13.0–17.0)
MCH: 26.9 pg (ref 26.0–34.0)
MCHC: 29.9 g/dL — ABNORMAL LOW (ref 30.0–36.0)
MCV: 89.8 fL (ref 80.0–100.0)
Platelets: 338 10*3/uL (ref 150–400)
RBC: 2.94 MIL/uL — ABNORMAL LOW (ref 4.22–5.81)
RDW: 17.3 % — ABNORMAL HIGH (ref 11.5–15.5)
WBC: 14 10*3/uL — ABNORMAL HIGH (ref 4.0–10.5)
nRBC: 0.1 % (ref 0.0–0.2)

## 2019-06-15 LAB — GLUCOSE, CAPILLARY
Glucose-Capillary: 133 mg/dL — ABNORMAL HIGH (ref 70–99)
Glucose-Capillary: 179 mg/dL — ABNORMAL HIGH (ref 70–99)
Glucose-Capillary: 180 mg/dL — ABNORMAL HIGH (ref 70–99)
Glucose-Capillary: 175 mg/dL — ABNORMAL HIGH (ref 70–99)

## 2019-06-15 LAB — PROTIME-INR
INR: 2.9 — ABNORMAL HIGH (ref 0.8–1.2)
Prothrombin Time: 29.5 seconds — ABNORMAL HIGH (ref 11.4–15.2)

## 2019-06-15 LAB — LACTATE DEHYDROGENASE: LDH: 165 U/L (ref 98–192)

## 2019-06-15 NOTE — Progress Notes (Signed)
Patient ID: [name], male   DOB: 08-10-1966, 53 y.o.   MRN: [id number]   Advanced Heart Failure VAD Team Note  PCP-Cardiologist: No primary care provider on file.   Subjective:    9/27 S/P R AKA   MAP 80-90s.    Resting comfortably this am. Denies nausea this am. Still with some leg pain.   LVAD INTERROGATION:  HeartMate 3 LVAD:   Flow 4.1 liters/min, speed 5500, power 4.4 PI 3.9.  No PI events.     Objective:    Vital Signs:   Temp:  [98 F (36.7 C)-101 F (38.3 C)] 98.8 F (37.1 C) (10/10 0816) Pulse Rate:  [79-93] 79 (10/10 0816) Resp:  [11-18] 12 (10/10 0816) BP: (91-132)/(63-95) 109/94 (10/10 0816) SpO2:  [97 %-100 %] 97 % (10/10 0816) Weight:  [83.8 kg-84.5 kg] 83.8 kg (10/10 0608) Last BM Date: 06/03/19 Mean arterial Pressure 80-90s   Intake/Output:   Intake/Output Summary (Last 24 hours) at 06/15/2019 0929 Last data filed at 06/15/2019 0408 Gross per 24 hour  Intake 236 ml  Output 2500 ml  Net -2264 ml     Physical Exam   General:  Lying flat in bed. NAD.  HEENT: normal   Neck: supple. JVP 6 LIJ TLC.  Carotids 2+ bilat; no bruits. No lymphadenopathy or thryomegaly appreciated. Cor: LVAD hum.  + left chest perm cath Lungs: Clear. Abdomen: obese soft, nontender, non-distended. No hepatosplenomegaly. No bruits or masses. Good bowel sounds. Driveline site clean. Anchor in place.  Extremities: S/p right AKA, left great toe amputation with surrounding discoloration of skin Neuro: alert & oriented x 3. No focal deficits. Moves all 4 without problem     Telemetry   NSR 70-80s personally reviewed.   Labs   Basic Metabolic Panel: Recent Labs  Lab 06/11/19 0446 06/12/19 0527 06/12/19 1236 06/13/19 0900 06/14/19 0400 06/15/19 0415  NA 133*  --  129* 128* 127* 135  K 3.9  --  4.1 3.8 4.3 3.7  CL 96*  --  95* 94* 91* 96*  CO2 25  --  21* 25 22 27   GLUCOSE 101*  --  206* 201* 142* 90  BUN 33*  --  52* 37* 51* 35*  CREATININE 6.10*  --   8.39* 5.95* 7.37* 4.95*  CALCIUM 8.4*  --  8.1* 7.9* 8.4* 8.4*  PHOS  --  7.3* 7.1*  --  5.4* 4.5    Liver Function Tests: Recent Labs  Lab 06/12/19 1236 06/14/19 0400 06/15/19 0415  ALBUMIN 1.9* 1.9* 1.9*   No results for input(s): LIPASE, AMYLASE in the last 168 hours. No results for input(s): AMMONIA in the last 168 hours.  CBC: Recent Labs  Lab 06/11/19 0446 06/12/19 0527 06/13/19 0500 06/14/19 0400 06/15/19 0415  WBC 15.3* 14.0* 10.8* 13.9* 14.0*  HGB 7.9* 7.4* 10.1* 7.4* 7.9*  HCT 25.5* 24.5* 31.5* 24.5* 26.4*  MCV 89.2 90.4 87.7 89.1 89.8  PLT 321 333 243 318 338    INR: Recent Labs  Lab 06/11/19 0446 06/12/19 0527 06/13/19 0500 06/14/19 0400 06/15/19 0415  INR 2.4* 2.6* 2.8* 2.6* 2.9*    Other results:    Imaging   No results found.   Medications:     Scheduled Medications: . sodium chloride   Intravenous Once  . sodium chloride   Intravenous Once  . aspirin EC  81 mg Oral Daily  . atorvastatin  40 mg Oral q1800  . Chlorhexidine Gluconate Cloth  6 each Topical Daily  .  Chlorhexidine Gluconate Cloth  6 each Topical Q0600  . Chlorhexidine Gluconate Cloth  6 each Topical Q0600  . citalopram  20 mg Oral Daily  . darbepoetin (ARANESP) injection - DIALYSIS  200 mcg Intravenous Q Wed-HD  . doxercalciferol  1 mcg Intravenous Q M,W,F-HD  . dronabinol  2.5 mg Oral BID AC  . erythromycin  250 mg Oral TID  . feeding supplement (NEPRO CARB STEADY)  237 mL Oral BID BM  . feeding supplement (PRO-STAT SUGAR FREE 64)  30 mL Oral BID  . gabapentin  300 mg Oral BID  . hydrALAZINE  25 mg Oral 3 times per day on Sun Tue Thu Sat  . influenza vac split quadrivalent PF  0.5 mL Intramuscular Tomorrow-1000  . insulin aspart  0-9 Units Subcutaneous TID WC  . insulin glargine  10 Units Subcutaneous Daily  . metoCLOPramide  10 mg Oral TID WC  . midodrine  5 mg Oral Q M,W,F  . multivitamin  1 tablet Oral QHS  . pantoprazole  40 mg Oral BID AC  . sevelamer  carbonate  0.8 g Oral TID WC  . warfarin  5 mg Oral Once per day on Sun Mon Wed Thu Sat  . warfarin  7.5 mg Oral Once per day on Tue Fri  . Warfarin - Pharmacist Dosing Inpatient   Does not apply q1800    Infusions: . sodium chloride 10 mL/hr at 06/01/19 1200  . sodium chloride 10 mL/hr at 06/02/19 0707    PRN Medications: sodium chloride, acetaminophen, docusate sodium, hydrALAZINE, LORazepam, LORazepam, oxyCODONE, promethazine, promethazine   Assessment/Plan:    1. 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  Pain at right thigh surgical site improving, off PCA.  No further incisional bleeding for a couple of days now.  - Concerned that he may eventually need further amputation on left, but no current plans.  L Foot looks ok with some ischemic changes around incision. Vascular has seen, plan to watch and see how things go. No additional plan for surgery this admission. No change  2. Chronic systolic CHF: Ischemic cardiomyopathy. St Jude ICD. NSTEMI in March 2018 with DES to LAD and RCA, complicated by cardiogenic shock, low output requiring milrinone. Echo 8/18 with EF 20-25%, moderate MR. CPX (8/18) with severe functional impairment due to HF. He is s/p HM3 LVAD placement. He had pump thrombosis 6/19 due to [facility] outflow graft kinking. He had pump exchange for another HM3. Not a candidate for transplant with PAD. Volume status managed at HD. INR 2.9 today.  - Continue ASA 81.   - Continue warfarin.  Discussed dosing with PharmD personally. 3. ESRD: HD on M-W-F with midodrine pre-HD.   4.CAD: NSTEMI in 3/18. LHC with 99% ulcerated lesion proximal RCA with left to right collaterals, 95% mid LAD stenosis after mid LAD stent. s/p PCI to RCA and LAD on 11/25/16. - No s/s ischemia - Continue statin and ASA. 5. h/o DVTs: Factor V Leiden heterozygote. -Anticoagulated. 6. Diabetic gastroparesis: Long history of this.  Increased nausea  post-op, may be related to increased narcotic use. Still occasional nausea but getting better. - Continue reglan 10 mg tid/ac and Marinol.  - Continue Protonix bid.  - He will continue erythromycinindefinitely.  - Will order low dose IV Ativan to help with nausea if needed.  - Limit narcotic use as much as possible. 7. HTN: Continue hydralazine 25 mg tid on non-HD days. Stable.  8. Pain:  Improving, now off IV meds.  9. ID: Chronic WBC elevation likely related to ischemic extremities. Abx expanded on 9/26 to vancomycin/meropenem, completed course on 10/2.  Blood cultures negative. Overall WBCs have trended lower and he remains afebrile.   10. Anemia: post-op anemia + anemia of renal disease.  He got 1 unit PRBCs with HD on 9/30 and again on 10/2.  He is not bleeding anymore at this surgical site and has had no overt GI bleeding. - hgb stable at 7.9 - At this point, will transfuse hgb < 7.  11. Disposition: He has been approved for [facility] and [facility] when a [facility] bed becomes available.    Length of Stay: [place], MD 06/15/2019, 9:29 AM  VAD Team --- VAD ISSUES ONLY--- Pager [phone] (7am - 7am)  Advanced Heart Failure Team  Pager [phone] (M-F; 7a - 4p)  Please contact [facility] for night-coverage after hours (4p -7a ) and weekends on [email or web address]

## 2019-06-15 NOTE — Progress Notes (Signed)
[facility] Progress Note   Assessment/ Plan:   OP Dialysis:[facility] MWF 4h 41mn 400/800 84kg 2K/3Ca Hep none TDC Venofer 10428mIV x 5 until 9/28 Mircera 200 mcg IV q 2 weeks (last 9/16)  Hectorol 28m38mIV TIW  Problem/Plan: # PAD bilateral LE's: SP L great toe amp on 9/23. 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 Per VVS.  Bloody drainage resolved, close to d/c.  Off antibiotics at this time, WBC down, blood cultures collected 10/6 d/t low-grade fever of 100.8, NGTD, no abx necessary.  # ESRD -HD MWF.On schedule MWF.  Completed rx 10/9.  Pt requests 1st shift, next planned 10/12  # Hypertension/volume - No volume excess on exam but Na is low indicating need for healthier UF goals.  Will increase UF goal todayWill need new EDW post amputation.   # Anemia- low,last outpt Mircera on 9/16.Got darbe 200 ug here on 9/30and ordered weekly wed hd . Rec'd 1 unit prbc's 9/30 and to get 2nd prbc 10/2.   # Metabolic bone disease -lowered VDRA d/t high corrected Ca, phos above goal- pt asking about different binders-will try renvela powder pack TID AC--> doing well with that  # CAD/ICM s/p LVAD - per HF team   # Dispo: [facility] approved now- from renal perspective can go anytime  Subjective:    No complaints.  Sitting up in bed.     Objective:   BP 104/83 (BP Location: Left Arm)   Pulse 84   Temp 98.7 F (37.1 C) (Oral)   Resp 18   Ht 5' 7.99" (1.727 m)   Wt 83.8 kg   SpO2 97%   BMI 28.10 kg/m   Physical Exam: Gen: NAD, appears well CVS: mechanical hum heard throughout the precordium Resp: RRR  Abd: obese Ext: s/p R AKA, L great toe amp, dusky edges, really unchanged ACCESS: [facility]an [facility] Centerabs: BMET Recent Labs  Lab 06/09/19 0448 06/10/19 0500 06/11/19 0446 06/12/19 0527 06/12/19 1236 06/13/19 0900 06/14/19 0400 06/15/19 0415  NA 133* 131* 133*  --  129* 128* 127* 135  K 3.9 4.4 3.9  --  4.1 3.8 4.3 3.7  CL 98 95* 96*  --   95* 94* 91* 96*  CO2 23 22 25   --  21* 25 22 27   GLUCOSE 114* 93 101*  --  206* 201* 142* 90  BUN 50* 60* 33*  --  52* 37* 51* 35*  CREATININE 7.37* 9.52* 6.10*  --  8.39* 5.95* 7.37* 4.95*  CALCIUM 8.7* 8.7* 8.4*  --  8.1* 7.9* 8.4* 8.4*  PHOS  --   --   --  7.3* 7.1*  --  5.4* 4.5   CBC Recent Labs  Lab 06/12/19 0527 06/13/19 0500 06/14/19 0400 06/15/19 0415  WBC 14.0* 10.8* 13.9* 14.0*  HGB 7.4* 10.1* 7.4* 7.9*  HCT 24.5* 31.5* 24.5* 26.4*  MCV 90.4 87.7 89.1 89.8  PLT 333 243 318 338    @IMGRELPRIORS @ Medications:    . sodium chloride   Intravenous Once  . sodium chloride   Intravenous Once  . aspirin EC  81 mg Oral Daily  . atorvastatin  40 mg Oral q1800  . Chlorhexidine Gluconate Cloth  6 each Topical Daily  . Chlorhexidine Gluconate Cloth  6 each Topical Q0600  . Chlorhexidine Gluconate Cloth  6 each Topical Q0600  . citalopram  20 mg Oral Daily  . darbepoetin (ARANESP) injection - DIALYSIS  200 mcg Intravenous Q Wed-HD  . doxercalciferol  1 mcg Intravenous Q M,W,F-HD  . dronabinol  2.5 mg Oral BID AC  . erythromycin  250 mg Oral TID  . feeding supplement (NEPRO CARB STEADY)  237 mL Oral BID BM  . feeding supplement (PRO-STAT SUGAR FREE 64)  30 mL Oral BID  . gabapentin  300 mg Oral BID  . hydrALAZINE  25 mg Oral 3 times per day on Sun Tue Thu Sat  . influenza vac split quadrivalent PF  0.5 mL Intramuscular Tomorrow-1000  . insulin aspart  0-9 Units Subcutaneous TID WC  . insulin glargine  10 Units Subcutaneous Daily  . metoCLOPramide  10 mg Oral TID WC  . midodrine  5 mg Oral Q M,W,F  . multivitamin  1 tablet Oral QHS  . pantoprazole  40 mg Oral BID AC  . sevelamer carbonate  0.8 g Oral TID WC  . warfarin  5 mg Oral Once per day on Sun Mon Wed Thu Sat  . warfarin  7.5 mg Oral Once per day on Tue Fri  . Warfarin - Pharmacist Dosing Inpatient   Does not apply q1800     Madelon Lips, MD 06/15/2019, 11:15 AM

## 2019-06-15 NOTE — Progress Notes (Signed)
ANTICOAGULATION CONSULT NOTE - Follow Up Consult  Pharmacy Consult for warfarin Indication: LVAD HM3  Allergies  Allergen Reactions  . Metformin And Related Diarrhea    Patient Measurements: Height: 5' 7.99" (172.7 cm) Weight: 184 lb 11.9 oz (83.8 kg) IBW/kg (Calculated) : 68.38   Vital Signs: Temp: 98.3 F (36.8 C) (10/10 0400) Temp Source: Oral (10/10 0400) BP: 106/81 (10/10 0332) Pulse Rate: 79 (10/10 0332)  Labs: Recent Labs    06/13/19 0500 06/13/19 0900 06/14/19 0400 06/15/19 0415  HGB 10.1*  --  7.4* 7.9*  HCT 31.5*  --  24.5* 26.4*  PLT 243  --  318 338  LABPROT 28.7*  --  27.7* 29.5*  INR 2.8*  --  2.6* 2.9*  CREATININE  --  5.95* 7.37* 4.95*    Estimated Creatinine Clearance: 18 mL/min (A) (by C-G formula based on SCr of 4.95 mg/dL (H)).   Medical History: Past Medical History:  Diagnosis Date  . Angina   . ASCVD (arteriosclerotic cardiovascular disease)    , Anterior infarction 2005, LAD diagonal bifurcation intervention 03/2004  . Automatic implantable cardiac defibrillator -St. Jude's       . Benign neoplasm of colon   . CHF (congestive heart failure) ([place])   . Chronic systolic heart failure ([place])   . Coronary artery disease     Widely patent previously placed stents in the left anterior   . Crohn's disease ([place])   . Deep venous thrombosis ([facility])    Recurrent-on Coumadin  . Dialysis patient ([place])   . Dyspnea   . Gastroparesis   . GERD (gastroesophageal reflux disease)   . High cholesterol   . Hyperlipidemia   . Hypersomnolent    Previous diagnosis of narcolepsy  . Hypertension, essential   . Ischemic cardiomyopathy    Ejection fraction 15-20% catheterization 2010  . Type II or unspecified type diabetes mellitus without mention of complication, not stated as uncontrolled   . Unspecified gastritis and gastroduodenitis without mention of hemorrhage     Assessment: 67 yoM on warfarin PTA for LVAD and hx FV Leiden admitted for ongoing  foot ischemia. 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 Pharmacy dosing warfarin. Heparin off 10/5.  -INR 2.9, hemoglobin 7.9, pltc wnl Nausea/emesis comes and goes - usually feels bad post HD. Pain controlled with gabapentin and prn oxy.  PTA warfarin 7.39m Tu/Fr 572mQOD  Goal of Therapy:  INR 2-3  Monitor platelets by anticoagulation protocol: Yes   Plan:  Warfarin 63m81mWThuSS and 7.63mg22mFr Continue PTA dosing Daily INR  MegaVertis KelcharmD PGY2 Cardiology Pharmacy Resident Phone ([phone]/2020       8:08 AM  Please check [email or web address] for unit-specific pharmacist phone numbers

## 2019-06-16 LAB — CBC
HCT: 24.1 % — ABNORMAL LOW (ref 39.0–52.0)
Hemoglobin: 7.2 g/dL — ABNORMAL LOW (ref 13.0–17.0)
MCH: 27 pg (ref 26.0–34.0)
MCHC: 29.9 g/dL — ABNORMAL LOW (ref 30.0–36.0)
MCV: 90.3 fL (ref 80.0–100.0)
Platelets: 324 10*3/uL (ref 150–400)
RBC: 2.67 MIL/uL — ABNORMAL LOW (ref 4.22–5.81)
RDW: 17.4 % — ABNORMAL HIGH (ref 11.5–15.5)
WBC: 14.2 10*3/uL — ABNORMAL HIGH (ref 4.0–10.5)
nRBC: 0.1 % (ref 0.0–0.2)

## 2019-06-16 LAB — CULTURE, BLOOD (ROUTINE X 2)
Culture: NO GROWTH
Culture: NO GROWTH

## 2019-06-16 LAB — GLUCOSE, CAPILLARY
Glucose-Capillary: 148 mg/dL — ABNORMAL HIGH (ref 70–99)
Glucose-Capillary: 134 mg/dL — ABNORMAL HIGH (ref 70–99)

## 2019-06-16 LAB — RENAL FUNCTION PANEL
Albumin: 1.8 g/dL — ABNORMAL LOW (ref 3.5–5.0)
Anion gap: 12 (ref 5–15)
BUN: 56 mg/dL — ABNORMAL HIGH (ref 6–20)
CO2: 24 mmol/L (ref 22–32)
Calcium: 8.2 mg/dL — ABNORMAL LOW (ref 8.9–10.3)
Chloride: 94 mmol/L — ABNORMAL LOW (ref 98–111)
Creatinine, Ser: 6.69 mg/dL — ABNORMAL HIGH (ref 0.61–1.24)
GFR calc Af Amer: 10 mL/min — ABNORMAL LOW (ref >60)
GFR calc non Af Amer: 9 mL/min — ABNORMAL LOW (ref >60)
Glucose, Bld: 174 mg/dL — ABNORMAL HIGH (ref 70–99)
Phosphorus: 4.6 mg/dL (ref 2.5–4.6)
Potassium: 3.9 mmol/L (ref 3.5–5.1)
Sodium: 130 mmol/L — ABNORMAL LOW (ref 135–145)

## 2019-06-16 LAB — PROTIME-INR
INR: 4.1 (ref 0.8–1.2)
Prothrombin Time: 39.3 seconds — ABNORMAL HIGH (ref 11.4–15.2)

## 2019-06-16 LAB — LACTATE DEHYDROGENASE: LDH: 171 U/L (ref 98–192)

## 2019-06-16 MED ORDER — WARFARIN SODIUM 5 MG PO TABS: ORAL_TABLET | ORAL | 6 refills | Status: DC

## 2019-06-16 MED ORDER — SEVELAMER CARBONATE 0.8 G PO PACK: 0.8000 g | PACK | Freq: Three times a day (TID) | ORAL | 1 refills | Status: DC

## 2019-06-16 NOTE — Progress Notes (Signed)
Report called to [place] RN at [place] on [facility]. All questions answered. Pt informed of transfer to [facility].

## 2019-06-16 NOTE — Progress Notes (Signed)
[facility] Progress Note   Assessment/ Plan:   OP Dialysis:[facility] MWF 4h 68mn 400/800 84kg 2K/3Ca Hep none TDC Venofer 1084mIV x 5 until 9/28 Mircera 200 mcg IV q 2 weeks (last 9/16)  Hectorol 39m78mIV TIW  Problem/Plan: # PAD bilateral LE's: SP L great toe amp on 9/23. 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 Per VVS.  Bloody drainage resolved, close to d/c.  Off antibiotics at this time, WBC ct up and down but overall improved, blood cultures collected 10/6 d/t low-grade fever of 100.8, NGTD, no abx necessary.  # ESRD -HD MWF.On schedule MWF.  Completed rx 10/9.  Pt requests 1st shift, next planned 10/12  # Hypertension/volume - No volume excess on exam but Na is low indicating need for healthier UF goals.  Will need new EDW post amputation.   # Anemia- low,last outpt Mircera on 9/16.Got darbe 200 ug here on 9/30and ordered weekly wed hd . Rec'd 1 unit prbc's 9/30 and to get 2nd prbc 10/2.   # Metabolic bone disease -lowered VDRA d/t high corrected Ca, phos above goal- pt asking about different binders-will try renvela powder pack TID AC--> doing well with that  # CAD/ICM s/p LVAD - per HF team   # n/v gastroparesis- on erythromycin and marinol  # Dispo: [facility] approved now- from renal perspective can go anytime  Subjective:    Eating breakfast.  No issues.  INR 4.1 this AM.    Objective:   BP 109/82 (BP Location: Left Arm)   Pulse 79   Temp 98.8 F (37.1 C) (Oral)   Resp 16   Ht 5' 7.99" (1.727 m)   Wt 86.3 kg   SpO2 (!) 78%   BMI 28.94 kg/m   Physical Exam: Gen: NAD, appears well, eating breakfast.   CVS: mechanical hum heard throughout the precordium Resp: normal WOB, muffled at bases  Abd: obese, + drive line intact Ext: s/p R AKA, L great toe amp, dusky edges, really unchanged, L 4th toe with well-demarcated ulcer ACCESS: L IJ TDC  Labs: BMET Recent Labs  Lab 06/10/19 0500 06/11/19 0446 06/12/19 0527  06/12/19 1236 06/13/19 0900 06/14/19 0400 06/15/19 0415 06/16/19 0239  NA 131* 133*  --  129* 128* 127* 135 130*  K 4.4 3.9  --  4.1 3.8 4.3 3.7 3.9  CL 95* 96*  --  95* 94* 91* 96* 94*  CO2 22 25  --  21* 25 22 27 24   GLUCOSE 93 101*  --  206* 201* 142* 90 174*  BUN 60* 33*  --  52* 37* 51* 35* 56*  CREATININE 9.52* 6.10*  --  8.39* 5.95* 7.37* 4.95* 6.69*  CALCIUM 8.7* 8.4*  --  8.1* 7.9* 8.4* 8.4* 8.2*  PHOS  --   --  7.3* 7.1*  --  5.4* 4.5 4.6   CBC Recent Labs  Lab 06/13/19 0500 06/14/19 0400 06/15/19 0415 06/16/19 0239  WBC 10.8* 13.9* 14.0* 14.2*  HGB 10.1* 7.4* 7.9* 7.2*  HCT 31.5* 24.5* 26.4* 24.1*  MCV 87.7 89.1 89.8 90.3  PLT 243 318 338 324    @IMGRELPRIORS @ Medications:    . sodium chloride   Intravenous Once  . sodium chloride   Intravenous Once  . aspirin EC  81 mg Oral Daily  . atorvastatin  40 mg Oral q1800  . Chlorhexidine Gluconate Cloth  6 each Topical Daily  . Chlorhexidine Gluconate Cloth  6 each Topical Q0600  . Chlorhexidine Gluconate Cloth  6 each Topical Q0600  . citalopram  20 mg Oral Daily  . darbepoetin (ARANESP) injection - DIALYSIS  200 mcg Intravenous Q Wed-HD  . doxercalciferol  1 mcg Intravenous Q M,W,F-HD  . dronabinol  2.5 mg Oral BID AC  . erythromycin  250 mg Oral TID  . feeding supplement (NEPRO CARB STEADY)  237 mL Oral BID BM  . feeding supplement (PRO-STAT SUGAR FREE 64)  30 mL Oral BID  . gabapentin  300 mg Oral BID  . hydrALAZINE  25 mg Oral 3 times per day on Sun Tue Thu Sat  . influenza vac split quadrivalent PF  0.5 mL Intramuscular Tomorrow-1000  . insulin aspart  0-9 Units Subcutaneous TID WC  . insulin glargine  10 Units Subcutaneous Daily  . metoCLOPramide  10 mg Oral TID WC  . midodrine  5 mg Oral Q M,W,F  . multivitamin  1 tablet Oral QHS  . pantoprazole  40 mg Oral BID AC  . sevelamer carbonate  0.8 g Oral TID WC  . Warfarin - Pharmacist Dosing Inpatient   Does not apply q1800     Madelon Lips,  MD 06/16/2019, 9:29 AM

## 2019-06-16 NOTE — TOC Transition Note (Signed)
Transition of Care [facility]) - CM/SW Discharge Note   Patient Details  Name: [name] MRN: [id number] Date of Birth: 26-Dec-1965  Transition of Care [facility]) CM/SW Contact:  Alberteen Sam, [place] Phone Number: [phone] 06/16/2019, 2:24 PM   Clinical Narrative:     Patient will DC [facility] Anticipated DC date: 06/16/2019 Family notified:N/A Transport XK:GYJE  Per MD patient ready for DC to [facility] . RN, patient, patient's family, and facility notified of DC. Discharge Summary sent to facility. RN given number for report. [facility] has given three numbers for report, try the first number then go from there if unable to reach : 1. [phone]  2. [phone]   3. [phone] . DC packet on chart. Accepting physician will be Dr. Manson Allan, physician to physician handoff's are encouraged. Dr. Manson Allan can be reached at [phone].  [facility] reports patient to nurse ratio will be 2 to 1, there will be one ICU trained LVAD nurse caring for patient and one other LVAD patient.   Ambulance transport requested for patient.  CSW signing off.  [employer], [place]   Final next level of care: Long Term Acute Care (LTAC) Barriers to Discharge: No Barriers Identified   Patient Goals and CMS Choice   CMS Medicare.gov Compare Post Acute Care list provided to:: Patient Choice offered to / list presented to : Patient  Discharge Placement              Patient chooses bed at: Other - please specify in the comment section below:([facility]) Patient to be transferred to facility by: PTAR      Discharge Plan and Services     Post Acute Care Choice: [place]                               Social Determinants of Health (SDOH) Interventions     Readmission Risk Interventions Readmission Risk Prevention Plan 03/20/2019  Transportation Screening Complete  Medication Review Press photographer) Complete  HRI or [place] Complete  SW  Recovery Care/Counseling Consult Complete  Palliative Care Screening Not [place] Not Applicable  Some recent data might be hidden

## 2019-06-16 NOTE — Progress Notes (Addendum)
ANTICOAGULATION CONSULT NOTE - Follow Up Consult  Pharmacy Consult for warfarin Indication: LVAD HM3  Allergies  Allergen Reactions  . Metformin And Related Diarrhea    Patient Measurements: Height: 5' 7.99" (172.7 cm) Weight: 190 lb 4.1 oz (86.3 kg) IBW/kg (Calculated) : 68.38   Vital Signs: Temp: 99 F (37.2 C) (10/11 0411) Temp Source: Oral (10/11 0411) BP: 111/76 (10/11 0411) Pulse Rate: 84 (10/11 0411)  Labs: Recent Labs    06/14/19 0400 06/15/19 0415 06/16/19 0239  HGB 7.4* 7.9* 7.2*  HCT 24.5* 26.4* 24.1*  PLT 318 338 324  LABPROT 27.7* 29.5* 39.3*  INR 2.6* 2.9* 4.1*  CREATININE 7.37* 4.95* 6.69*    Estimated Creatinine Clearance: 13.5 mL/min (A) (by C-G formula based on SCr of 6.69 mg/dL (H)).   Medical History: Past Medical History:  Diagnosis Date  . Angina   . ASCVD (arteriosclerotic cardiovascular disease)    , Anterior infarction 2005, LAD diagonal bifurcation intervention 03/2004  . Automatic implantable cardiac defibrillator -St. Jude's       . Benign neoplasm of colon   . CHF (congestive heart failure) ([place])   . Chronic systolic heart failure ([place])   . Coronary artery disease     Widely patent previously placed stents in the left anterior   . Crohn's disease ([place])   . 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 Dialysis patient ([place])   . Dyspnea   . Gastroparesis   . GERD (gastroesophageal reflux disease)   . High cholesterol   . Hyperlipidemia   . Hypersomnolent    Previous diagnosis of narcolepsy  . Hypertension, essential   . Ischemic cardiomyopathy    Ejection fraction 15-20% catheterization 2010  . Type II or unspecified type diabetes mellitus without mention of complication, not stated as uncontrolled   . Unspecified gastritis and gastroduodenitis without mention of hemorrhage     Assessment: 50 yoM on warfarin PTA for LVAD and hx FV Leiden admitted for ongoing foot ischemia. Pt now s/p L toe amputation (9/23)  and R AKA (9/27) - warfarin was held for procedures and now resumed. Pharmacy dosing warfarin. Heparin off 10/5.  INR trended up to 4.1 this morning, seeing large increase overnight on continued home dose of warfarin. Hemoglobin down 7.2.  Nausea/emesis comes and goes - usually feels bad post HD. Pain controlled with gabapentin and prn oxy.  PTA warfarin 7.37m Tu/Fr 513mQOD  Goal of Therapy:  INR 2-3  Monitor platelets by anticoagulation protocol: Yes   Plan for Discharge to [facility] today:  Recommend holding warfarin today and tomorrow then resume warfarin 23m60maily starting 10/13 Patient was a home INR checker every Friday, will try and get an INR on 10/13 prior to redosing  FraErin HearingarmD., BCPS Clinical Pharmacist 06/16/2019 8:14 AM  Please check [email or web address] for unit-specific pharmacist phone numbers

## 2019-06-16 NOTE — TOC Progression Note (Addendum)
Transition of Care [facility]) - Progression Note    Patient Details  Name: [name] MRN: [id number] Date of Birth: 07/10/1966  Transition of Care [facility]) CM/SW [place], [place] Phone Number: [phone] 06/16/2019, 11:07 AM  Clinical Narrative:     Update: Patient has bed today. RN made aware, requesting dc summary and orders.   CSW spoke with Ander Purpura at [facility] and she states that no HD beds avail today but reports they will potentially have 2 discharges this week and they are hoping for patient to be able to discharge to them this week. She reports his insurance [name] is good until Thursday October 15. Lauren will follow up with Neoma Laming [facility] this week on bed availability.    Expected Discharge Plan: [place] Barriers to Discharge: No SNF bed, Continued Medical Work up(still in search of LVAD and Dialysis patient SNF bed)  Expected Discharge Plan and Services Expected Discharge Plan: [place] Choice: [place] arrangements for the past 2 months: Single Family Home                                       Social Determinants of Health (SDOH) Interventions    Readmission Risk Interventions Readmission Risk Prevention Plan 03/20/2019  Transportation Screening Complete  Medication Review Press photographer) Complete  HRI or [place] Complete  SW Recovery Care/Counseling Consult Complete  [place] Not Applicable  Some recent data might be hidden

## 2019-06-16 NOTE — Progress Notes (Signed)
Called by CSW that patient has bed availability at [facility] for today, paged Dr. Haroldine Laws - awaiting d/c orders at this time.

## 2019-06-16 NOTE — Progress Notes (Addendum)
Patient ID: [name], male   DOB: Jun 08, 1966, 53 y.o.   MRN: [id number]   Advanced Heart Failure VAD Team Note  PCP-Cardiologist: No primary care provider on file.   Subjective:    9/27 S/P R AKA   MAP 80-90s.    Denies nausea or leg pain this am. Asking if he can keep central line in for [facility].   LVAD INTERROGATION:  HeartMate 3 LVAD:   Flow 3.9 liters/min, speed 5500, power 4.4 PI 4.3.  No PI events.     Objective:    Vital Signs:   Temp:  [98 F (36.7 C)-99.4 F (37.4 C)] 98.8 F (37.1 C) (10/11 0821) Pulse Rate:  [68-95] 79 (10/11 0821) Resp:  [12-20] 16 (10/11 0821) BP: (88-111)/(74-83) 109/82 (10/11 0821) SpO2:  [78 %-98 %] 78 % (10/11 0821) Weight:  [86.3 kg] 86.3 kg (10/11 0411) Last BM Date: 06/03/19 Mean arterial Pressure 80-90s   Intake/Output:   Intake/Output Summary (Last 24 hours) at 06/16/2019 0931 Last data filed at 06/15/2019 2200 Gross per 24 hour  Intake 236 ml  Output -  Net 236 ml     Physical Exam   General:  Lying in bed. NAD.  HEENT: normal  Neck: supple. JVP 6-7.  Carotids 2+ bilat; no bruits. No lymphadenopathy or thryomegaly appreciated. Cor: LVAD hum. + left perm cath Lungs: Clear. Abdomen: soft, nontender, non-distended. No hepatosplenomegaly. No bruits or masses. Good bowel sounds. Driveline site clean. Anchor in place.  Extremities: S/p right AKA, left great toe amputation  Neuro: alert & oriented x 3. No focal deficits. Moves all 4 without problem    Telemetry   NSR 70-80s personally reviewed.   Labs   Basic Metabolic Panel: Recent Labs  Lab 06/12/19 0527 06/12/19 1236 06/13/19 0900 06/14/19 0400 06/15/19 0415 06/16/19 0239  NA  --  129* 128* 127* 135 130*  K  --  4.1 3.8 4.3 3.7 3.9  CL  --  95* 94* 91* 96* 94*  CO2  --  21* 25 22 27 24   GLUCOSE  --  206* 201* 142* 90 174*  BUN  --  52* 37* 51* 35* 56*  CREATININE  --  8.39* 5.95* 7.37* 4.95* 6.69*  CALCIUM  --  8.1* 7.9* 8.4* 8.4* 8.2*  PHOS 7.3*  7.1*  --  5.4* 4.5 4.6    Liver Function Tests: Recent Labs  Lab 06/12/19 1236 06/14/19 0400 06/15/19 0415 06/16/19 0239  ALBUMIN 1.9* 1.9* 1.9* 1.8*   No results for input(s): LIPASE, AMYLASE in the last 168 hours. No results for input(s): AMMONIA in the last 168 hours.  CBC: Recent Labs  Lab 06/12/19 0527 06/13/19 0500 06/14/19 0400 06/15/19 0415 06/16/19 0239  WBC 14.0* 10.8* 13.9* 14.0* 14.2*  HGB 7.4* 10.1* 7.4* 7.9* 7.2*  HCT 24.5* 31.5* 24.5* 26.4* 24.1*  MCV 90.4 87.7 89.1 89.8 90.3  PLT 333 243 318 338 324    INR: Recent Labs  Lab 06/12/19 0527 06/13/19 0500 06/14/19 0400 06/15/19 0415 06/16/19 0239  INR 2.6* 2.8* 2.6* 2.9* 4.1*    Other results:    Imaging   No results found.   Medications:     Scheduled Medications: . sodium chloride   Intravenous Once  . sodium chloride   Intravenous Once  . aspirin EC  81 mg Oral Daily  . atorvastatin  40 mg Oral q1800  . Chlorhexidine Gluconate Cloth  6 each Topical Daily  . Chlorhexidine Gluconate Cloth  6 each Topical  [id number]  . Chlorhexidine Gluconate Cloth  6 each Topical Q0600  . citalopram  20 mg Oral Daily  . darbepoetin (ARANESP) injection - DIALYSIS  200 mcg Intravenous Q Wed-HD  . doxercalciferol  1 mcg Intravenous Q M,W,F-HD  . dronabinol  2.5 mg Oral BID AC  . erythromycin  250 mg Oral TID  . feeding supplement (NEPRO CARB STEADY)  237 mL Oral BID BM  . feeding supplement (PRO-STAT SUGAR FREE 64)  30 mL Oral BID  . gabapentin  300 mg Oral BID  . hydrALAZINE  25 mg Oral 3 times per day on Sun Tue Thu Sat  . influenza vac split quadrivalent PF  0.5 mL Intramuscular Tomorrow-1000  . insulin aspart  0-9 Units Subcutaneous TID WC  . insulin glargine  10 Units Subcutaneous Daily  . metoCLOPramide  10 mg Oral TID WC  . midodrine  5 mg Oral Q M,W,F  . multivitamin  1 tablet Oral QHS  . pantoprazole  40 mg Oral BID AC  . sevelamer carbonate  0.8 g Oral TID WC  . Warfarin - Pharmacist Dosing  Inpatient   Does not apply q1800    Infusions: . sodium chloride 10 mL/hr at 06/01/19 1200  . sodium chloride 10 mL/hr at 06/02/19 0707    PRN Medications: sodium chloride, acetaminophen, docusate sodium, hydrALAZINE, LORazepam, LORazepam, oxyCODONE, promethazine, promethazine   Assessment/Plan:    1. 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  Pain at right thigh surgical site improving, off PCA.  No further incisional bleeding for a couple of days now.  - Concerned that he may eventually need further amputation on left, but no current plans.  L Foot looks ok with some ischemic changes around incision. Vascular has seen, plan to watch and see how things go. No additional plan for surgery this admission. - No changes  2. Chronic systolic CHF: Ischemic cardiomyopathy. St Jude ICD. NSTEMI in March 2018 with DES to LAD and RCA, complicated by cardiogenic shock, low output requiring milrinone. Echo 8/18 with EF 20-25%, moderate MR. CPX (8/18) with severe functional impairment due to HF. He is s/p HM3 LVAD placement. He had pump thrombosis 6/19 due to [facility] outflow graft kinking. He had pump exchange for another HM3. Not a candidate for transplant with PAD. Volume status managed at HD.  - INR 4.1 today.  - Continue ASA 81.   - Hold warfarin today. Discussed dosing with PharmD personally. - LDH 171. VAD interrogated personally. Parameters stable. 3. ESRD: HD on M-W-F with midodrine pre-HD.   - For HD in am 4.CAD: NSTEMI in 3/18. LHC with 99% ulcerated lesion proximal RCA with left to right collaterals, 95% mid LAD stenosis after mid LAD stent. s/p PCI to RCA and LAD on 11/25/16. - No s/sischemia - Continue statin and ASA. 5. h/o DVTs: Factor V Leiden heterozygote. -Anticoagulated. 6. Diabetic gastroparesis: Long history of this.  Increased nausea post-op, may be related to increased narcotic use. Still occasional nausea but getting  better. - Continue reglan 10 mg tid/ac and Marinol.  - Continue Protonix bid.  - He will continue erythromycinindefinitely.  - Will order low dose IV Ativan to help with nausea if needed.  - Limit narcotic use as much as possible. 7. HTN: Continue hydralazine 25 mg tid on non-HD days. Stable.  8. Pain: Improving, now off IV meds.  9. ID: Chronic WBC elevation likely related to ischemic extremities. Abx expanded on 9/26 to  vancomycin/meropenem, completed course on 10/2.  Blood cultures negative. Overall WBCs have trended lower and he remains afebrile.   10. Anemia: post-op anemia + anemia of renal disease.  He got 1 unit PRBCs with HD on 9/30 and again on 10/2.  He is not bleeding anymore at this surgical site and has had no overt GI bleeding. - hgb down today 7.9 -> 7.2 - At this point, will transfuse hgb < 7.  11. Disposition: He has been approved for [facility] and can go to [facility] when a HD bed becomes available.  Hopefully tomorrow after HD  Length of Stay: [place], MD 06/16/2019, 9:31 AM  VAD Team --- VAD ISSUES ONLY--- Pager [phone] (7am - 7am)  Advanced Heart Failure Team  Pager [phone] (M-F; 7a - 4p)  Please contact [facility] for night-coverage after hours (4p -7a ) and weekends on [email or web address]

## 2019-06-16 NOTE — Progress Notes (Signed)
Patient blood pressure 88/74 (80), 100/78 (86)  Doppler 80 Held 2200 dose of hydralazine , will continue to monitor pressures/doppler closely for changes. Patient resting comfortably in bed, no complaints of pain.

## 2019-06-16 NOTE — Progress Notes (Signed)
CRITICAL VALUE ALERT  Critical Value:  INR 4.1  Date & Time Notied:  06/16/2019 3:20 AM  Provider Notified: Dr. Winfield Cunas  Orders Received/Actions taken: Provider returned call, no orders needed at this time.

## 2019-06-16 NOTE — Plan of Care (Signed)
  Problem: Health Behavior/Discharge Planning: Goal: Ability to manage health-related needs will improve Outcome: Progressing   Problem: Clinical Measurements: Goal: Ability to maintain clinical measurements within normal limits will improve Outcome: Progressing Goal: Will remain free from infection Outcome: Progressing Goal: Diagnostic test results will improve Outcome: Progressing Goal: Respiratory complications will improve Outcome: Progressing Goal: Cardiovascular complication will be avoided Outcome: Progressing   Problem: Activity: Goal: Risk for activity intolerance will decrease Outcome: Progressing   Problem: Nutrition: Goal: Adequate nutrition will be maintained Outcome: Progressing   Problem: Coping: Goal: Level of anxiety will decrease Outcome: Progressing   Problem: Elimination: Goal: Will not experience complications related to bowel motility Outcome: Progressing   Problem: Pain Managment: Goal: General experience of comfort will improve Outcome: Progressing   Problem: Safety: Goal: Ability to remain free from injury will improve Outcome: Progressing   Problem: Skin Integrity: Goal: Risk for impaired skin integrity will decrease Outcome: Progressing   Problem: Education: Goal: Patient will understand all VAD equipment and how it functions Outcome: Progressing Goal: Patient will be able to verbalize current INR target range and antiplatelet therapy for discharge home Outcome: Progressing   Problem: Cardiac: Goal: LVAD will function as expected and patient will experience no clinical alarms Outcome: Progressing   Problem: Education: Goal: Knowledge of the prescribed therapeutic regimen will improve Outcome: Progressing Goal: Ability to verbalize activity precautions or restrictions will improve Outcome: Progressing Goal: Understanding of discharge needs will improve Outcome: Progressing   Problem: Activity: Goal: Ability to perform//tolerate  increased activity and mobilize with assistive devices will improve Outcome: Progressing   Problem: Clinical Measurements: Goal: Postoperative complications will be avoided or minimized Outcome: Progressing   Problem: Self-Care: Goal: Ability to meet self-care needs will improve Outcome: Progressing   Problem: Self-Concept: Goal: Ability to maintain and perform role responsibilities to the fullest extent possible will improve Outcome: Progressing   Problem: Pain Management: Goal: Pain level will decrease with appropriate interventions Outcome: Progressing   Problem: Skin Integrity: Goal: Demonstration of wound healing without infection will improve Outcome: Progressing

## 2019-06-16 NOTE — Plan of Care (Signed)
  Problem: Clinical Measurements: Goal: Will remain free from infection Outcome: Progressing   Problem: Activity: Goal: Risk for activity intolerance will decrease Outcome: Progressing   Problem: Coping: Goal: Level of anxiety will decrease Outcome: Progressing   Problem: Pain Managment: Goal: General experience of comfort will improve Outcome: Progressing   

## 2019-06-18 ENCOUNTER — Telehealth: Payer: Self-pay | Admitting: *Deleted

## 2019-06-18 NOTE — Telephone Encounter (Signed)
[facility] called to inform us pt was currently with them for the next 25 - 30 days.  If needed pt case manager is [phone] Burnett Kanaris) Christen Bame, CMA

## 2019-06-21 ENCOUNTER — Ambulatory Visit: Payer: BC Managed Care – PPO

## 2019-06-25 ENCOUNTER — Telehealth (HOSPITAL_COMMUNITY): Payer: Self-pay | Admitting: Licensed Clinical Social Worker

## 2019-06-25 NOTE — Telephone Encounter (Signed)
CSW received call from patient's daughter requesting assistance with obtaining a hospital bed. CSW encouraged her to return call to discharge planning staff at [facility] where patient is currently hospitalized for assistance.  CSW also mentioned to daughter that it is open enrollment for Medicare. Patient has shared in the past disappointment with his current Medicare plan and CSW provided the number for [facility] the state health insurance counseling program for further advice and options for patient. Daughter also shared that the light outside the home has been removed and they are waiting for installation of the ramp.  Daughter grateful for the support and resources and will call if further needs arise. CSW continues to be available as needed. Raquel Sarna, [place], [place]

## 2019-07-09 ENCOUNTER — Encounter (HOSPITAL_COMMUNITY): Payer: BC Managed Care – PPO

## 2019-07-09 ENCOUNTER — Telehealth (HOSPITAL_COMMUNITY): Payer: Self-pay | Admitting: *Deleted

## 2019-07-09 NOTE — Telephone Encounter (Signed)
Spoke with pt re: follow up appt scheduled 07/18/19 at 0900. He verbalized understanding. He states he is feeling much better and is learning new techniques for "getting around." He is looking forward to going home. Plan is for him to discharge home from [facility] on 07/11/19.   [name] RN [place] Coordinator  Office: [phone]  24/7 Pager: [phone]

## 2019-07-15 ENCOUNTER — Telehealth: Payer: Self-pay

## 2019-07-15 NOTE — Telephone Encounter (Signed)
Pt called and said that he has not had a follow up from his amputation and he is having some issues with his foot. He said that he doesn't think that his other toes are getting good circulation. He said that they are discolored.   Appt made for pt to be seen.   York Cerise, CMA

## 2019-07-16 ENCOUNTER — Ambulatory Visit (HOSPITAL_COMMUNITY): Payer: Self-pay | Admitting: Pharmacist

## 2019-07-16 LAB — POCT INR: INR: 3.8 — AB (ref 2.0–3.0)

## 2019-07-16 NOTE — Progress Notes (Signed)
LVAD INR 

## 2019-07-17 ENCOUNTER — Encounter: Payer: Self-pay | Admitting: *Deleted

## 2019-07-17 ENCOUNTER — Other Ambulatory Visit (HOSPITAL_COMMUNITY): Payer: Self-pay | Admitting: Unknown Physician Specialty

## 2019-07-17 ENCOUNTER — Ambulatory Visit (HOSPITAL_COMMUNITY)
Admission: RE | Admit: 2019-07-17 | Discharge: 2019-07-17 | Disposition: A | Discharge status: Home / Self Care | Payer: BC Managed Care – PPO | Source: Ambulatory Visit | Attending: Family | Admitting: Family

## 2019-07-17 ENCOUNTER — Other Ambulatory Visit (HOSPITAL_COMMUNITY): Payer: Self-pay | Admitting: Family

## 2019-07-17 ENCOUNTER — Ambulatory Visit (INDEPENDENT_AMBULATORY_CARE_PROVIDER_SITE_OTHER): Payer: Self-pay | Admitting: Family

## 2019-07-17 ENCOUNTER — Other Ambulatory Visit: Payer: Self-pay | Admitting: *Deleted

## 2019-07-17 ENCOUNTER — Other Ambulatory Visit: Payer: Self-pay

## 2019-07-17 ENCOUNTER — Telehealth: Payer: Self-pay | Admitting: *Deleted

## 2019-07-17 ENCOUNTER — Encounter: Payer: Self-pay | Admitting: Family

## 2019-07-17 VITALS — BP 100/50 | Temp 99.2°F | Resp 16 | Ht 67.0 in | Wt 180.8 lb

## 2019-07-17 DIAGNOSIS — I739 Peripheral vascular disease, unspecified: Secondary | ICD-10-CM | POA: Diagnosis present

## 2019-07-17 DIAGNOSIS — I96 Gangrene, not elsewhere classified: Secondary | ICD-10-CM

## 2019-07-17 DIAGNOSIS — Z7901 Long term (current) use of anticoagulants: Secondary | ICD-10-CM

## 2019-07-17 DIAGNOSIS — Z95811 Presence of heart assist device: Secondary | ICD-10-CM

## 2019-07-17 DIAGNOSIS — N186 End stage renal disease: Secondary | ICD-10-CM

## 2019-07-17 DIAGNOSIS — Z992 Dependence on renal dialysis: Secondary | ICD-10-CM

## 2019-07-17 NOTE — Patient Instructions (Signed)
Peripheral Vascular Disease  Peripheral vascular disease (PVD) is a disease of the blood vessels that are not part of your heart and brain. A simple term for PVD is poor circulation. In most cases, PVD narrows the blood vessels that carry blood from your heart to the rest of your body. This can reduce the supply of blood to your arms, legs, and internal organs, like your stomach or kidneys. However, PVD most often affects a person's lower legs and feet. Without treatment, PVD tends to get worse. PVD can also lead to acute ischemic limb. This is when an arm or leg suddenly cannot get enough blood. This is a medical emergency. Follow these instructions at home: Lifestyle  Do not use any products that contain nicotine or tobacco, such as cigarettes and e-cigarettes. If you need help quitting, ask your doctor.  Lose weight if you are overweight. Or, stay at a healthy weight as told by your doctor.  Eat a diet that is low in fat and cholesterol. If you need help, ask your doctor.  Exercise regularly. Ask your doctor for activities that are right for you. General instructions  Take over-the-counter and prescription medicines only as told by your doctor.  Take good care of your feet: ? Wear comfortable shoes that fit well. ? Check your feet often for any cuts or sores.  Keep all follow-up visits as told by your doctor This is important. Contact a doctor if:  You have cramps in your legs when you walk.  You have leg pain when you are at rest.  You have coldness in a leg or foot.  Your skin changes.  You are unable to get or have an erection (erectile dysfunction).  You have cuts or sores on your feet that do not heal. Get help right away if:  Your arm or leg turns cold, numb, and blue.  Your arms or legs become red, warm, swollen, painful, or numb.  You have chest pain.  You have trouble breathing.  You suddenly have weakness in your face, arm, or leg.  You become very  confused or you cannot speak.  You suddenly have a very bad headache.  You suddenly cannot see. Summary  Peripheral vascular disease (PVD) is a disease of the blood vessels.  A simple term for PVD is poor circulation. Without treatment, PVD tends to get worse.  Treatment may include exercise, low fat and low cholesterol diet, and quitting smoking. This information is not intended to replace advice given to you by your health care provider. Make sure you discuss any questions you have with your health care provider. Document Released: 11/16/2009 Document Revised: 08/04/2017 Document Reviewed: 09/29/2016 Elsevier Patient Education  2020 [employer]  

## 2019-07-17 NOTE — H&P (View-Only) (Signed)
[facility] [place]   CC: Left foot pain and dry gangrene, peripheral artery occlusive disease  History of Present Illness [name] is a 53 y.o. 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 Donnetta Hutching for  gangrene right or foot. He is also s/p Left great toe amputation including metatarsal head on 05-29-19 by Dr. Trula Slade for Left great toe ischemia.  He is also s/p placement of Left SFA stent (6 mm x 60 mm drug-coated Eluvia stent postdilated with a 5 mm Mustang) on 05-02-19 by Dr. Carlis Abbott for critical limb ischemia left lower extremity with tissue loss.   Pt returns today with c/o pain and dry gangrene in left forefoot for several, is worsening per pt.  He states that betadine dressing changes occur for his left foot, is recently home from [facility].  Pt denies fever or chills.   He also has ESRD and dialyzes on MWF via left IJ TDC at [facility], [place].  Pt states he has had accesses in both arms which have failed.   He wears an LVAD.   Diabetic: Yes Tobacco use: non-smoker  Pt meds include: Statin :yes Betablocker: No ASA: Yes Other anticoagulants/antiplatelets: warfarin   Past Medical History:  Diagnosis Date   Angina    ASCVD (arteriosclerotic cardiovascular disease)    , Anterior infarction 2005, LAD diagonal bifurcation intervention 03/2004   Automatic implantable cardiac defibrillator -St. Jude's        Benign neoplasm of colon    CHF (congestive heart failure) (HCC)    Chronic systolic heart failure (HCC)    Coronary artery disease     Widely patent previously placed stents in the left anterior    Crohn's disease ([place])    Deep venous thrombosis (HCC)    Recurrent-on Coumadin   Dialysis patient ([place])    Dyspnea    Gastroparesis    GERD (gastroesophageal reflux disease)    High cholesterol    Hyperlipidemia    Hypersomnolent    Previous diagnosis of narcolepsy   Hypertension, essential     Ischemic cardiomyopathy    Ejection fraction 15-20% catheterization 2010   Type II or unspecified type diabetes mellitus without mention of complication, not stated as uncontrolled    Unspecified gastritis and gastroduodenitis without mention of hemorrhage     Social History Social History   Tobacco Use   Smoking status: Never Smoker   Smokeless tobacco: Never Used  Substance Use Topics   Alcohol use: No   Drug use: No    Family History Family History  Problem Relation Age of Onset   Colon polyps Mother    Diabetes Mother    Heart attack Father    Diabetes Father    Stroke Paternal Grandfather    Colitis Sister    Heart disease Brother    Colitis Sister    Colon cancer Neg Hx     Past Surgical History:  Procedure Laterality Date   A/V SHUNTOGRAM N/A 05/24/2018   Procedure: A/V SHUNTOGRAM;  Surgeon: Marty Heck, MD;  Location: [place] [facility];  Service: Cardiovascular;  Laterality: N/A;   ABDOMINAL AORTOGRAM W/LOWER EXTREMITY N/A 03/18/2019   Procedure: ABDOMINAL AORTOGRAM W/LOWER EXTREMITY;  Surgeon: Waynetta Sandy, MD;  Location: [place] [facility];  Service: Cardiovascular;  Laterality: N/A;   AMPUTATION Right 03/21/2019   Procedure: AMPUTATION DIGIT RIGHT FOURTH TOE;  Surgeon: Waynetta Sandy, MD;  Location: [place];  Service: Vascular;  Laterality: Right;   AMPUTATION Left 05/29/2019   Procedure: AMPUTATION LEFT GREAT TOE;  Surgeon: Serafina Mitchell, MD;  Location: [place];  Service: Vascular;  Laterality: Left;   AMPUTATION Right 06/02/2019   Procedure: ABOVE KNEE AMPUTATION;  Surgeon: Rosetta Posner, MD;  Location: [place];  Service: Vascular;  Laterality: Right;   APPENDECTOMY     AV FISTULA PLACEMENT Right 06/26/2017   Procedure: ARTERIOVENOUS (AV) FISTULA CREATION VERSUS GRAFT RIGHT  ARM;  Surgeon: Conrad Ordway, MD;  Location: [place];  Service: Vascular;  Laterality: Right;   AV FISTULA PLACEMENT Right 10/31/2017    Procedure: INSERTION OF ARTERIOVENOUS (AV) ARTEGRAFT,  RIGHT UPPER ARM;  Surgeon: Conrad [place], MD;  Location: [place];  Service: Vascular;  Laterality: Right;   AV FISTULA PLACEMENT Left 05/29/2018   Procedure: ARTERIOVENOUS (AV) FISTULA CREATION  LEFT UPPER EXTREMITY;  Surgeon: Waynetta Sandy, MD;  Location: [place];  Service: Vascular;  Laterality: Left;   AV FISTULA PLACEMENT Left 08/09/2018   Procedure: INSERTION OF ARTERIOVENOUS (AV) GORE-TEX GRAFT LEFT UPPER ARM;  Surgeon: Waynetta Sandy, MD;  Location: [place];  Service: Vascular;  Laterality: Left;   CARDIAC DEFIBRILLATOR PLACEMENT  2010   St. Jude ICD   CENTRAL LINE INSERTION Right 05/24/2018   Procedure: CENTRAL LINE INSERTION;  Surgeon: Marty Heck, MD;  Location: [place] [facility];  Service: Cardiovascular;  Laterality: Right;   COLECTOMY  ~ 2003   "for Crohn's" ascending colon.     CORONARY STENT INTERVENTION N/A 11/25/2016   Procedure: Coronary Stent Intervention;  Surgeon: Leonie Man, MD;  Location: [place] [facility];  Service: Cardiovascular;  Laterality: N/A;   EP IMPLANTABLE DEVICE N/A 09/14/2016   Procedure: ICD Generator Changeout;  Surgeon: Deboraha Sprang, MD;  Location: [place] [facility];  Service: Cardiovascular;  Laterality: N/A;   ESOPHAGOGASTRODUODENOSCOPY N/A 07/12/2017   Procedure: ESOPHAGOGASTRODUODENOSCOPY (EGD);  Surgeon: Jerene Bears, MD;  Location: [facility];  Service: Gastroenterology;  Laterality: N/A;  VAD pt so VAD team will need to accompany pt.    FETAL SURGERY FOR CONGENITAL HERNIA     ????? pt has no knowledge of this.[name]     FISTULOGRAM Left 08/09/2018   Procedure: FISTULOGRAM LEFT ARM;  Surgeon: Waynetta Sandy, MD;  Location: [place];  Service: Vascular;  Laterality: Left;   INGUINAL HERNIA REPAIR     INSERTION OF DIALYSIS CATHETER Right 06/26/2017   Procedure: INSERTION OF TUNNELED  DIALYSIS CATHETER RIGHT INTERNAL JUGULAR;  Surgeon: Conrad [place],  MD;  Location: [place];  Service: Vascular;  Laterality: Right;   INSERTION OF IMPLANTABLE LEFT VENTRICULAR ASSIST DEVICE N/A 06/12/2017   Procedure: INSERTION OF IMPLANTABLE LEFT VENTRICULAR ASSIST [place] 3;  Surgeon: Ivin Poot, MD;  Location: [place];  Service: Open Heart Surgery;  Laterality: N/A;  HM3 LVAD  CIRC ARREST  NITRIC OXIDE   INSERTION OF IMPLANTABLE LEFT VENTRICULAR ASSIST DEVICE N/A 02/28/2018   Procedure: REDO INSERTION OF IMPLANTABLE LEFT VENTRICULAR ASSIST DEVICE - HEARTMATE 3;  Surgeon: Ivin Poot, MD;  Location: [place];  Service: Open Heart Surgery;  Laterality: N/A;   IR REMOVAL TUN CV CATH W/O FL  02/26/2018   IR THORACENTESIS ASP PLEURAL SPACE W/IMG GUIDE  06/28/2017   LIGATION OF ARTERIOVENOUS  FISTULA Right 10/31/2017   Procedure: LIGATION OF BRACHIO-BASILIC VEIN TRANSPOSITION RIGHT ARM;  Surgeon: Conrad [place], MD;  Location: [place];  Service: Vascular;  Laterality: Right;  LOWER EXTREMITY ANGIOGRAPHY  05/02/2019   Procedure: Lower Extremity Angiography;  Surgeon: Marty Heck, MD;  Location: [place] [facility];  Service: Cardiovascular;;   MULTIPLE EXTRACTIONS WITH ALVEOLOPLASTY N/A 06/08/2017   Procedure: MULTIPLE EXTRACTION WITH ALVEOLOPLASTY AND PRE PROSTHETIC SURGERY AS NEEDED;  Surgeon: Lenn Cal, DDS;  Location: [place];  Service: Oral Surgery;  Laterality: N/A;   PERIPHERAL VASCULAR BALLOON ANGIOPLASTY  03/18/2019   Procedure: PERIPHERAL VASCULAR BALLOON ANGIOPLASTY;  Surgeon: Waynetta Sandy, MD;  Location: [place] [facility];  Service: Cardiovascular;;  RT PT   PERIPHERAL VASCULAR INTERVENTION  03/18/2019   Procedure: PERIPHERAL VASCULAR INTERVENTION;  Surgeon: Waynetta Sandy, MD;  Location: [place] [facility];  Service: Cardiovascular;;  RT SFA   PERIPHERAL VASCULAR INTERVENTION  05/02/2019   Procedure: PERIPHERAL VASCULAR INTERVENTION;  Surgeon: Marty Heck, MD;  Location: [place] [facility];   Service: Cardiovascular;;  Lt. SFA   RIGHT HEART CATH N/A 06/07/2017   Procedure: RIGHT HEART CATH;  Surgeon: Larey Dresser, MD;  Location: [place] [facility];  Service: Cardiovascular;  Laterality: N/A;   RIGHT HEART CATH N/A 02/08/2018   Procedure: RIGHT HEART CATH;  Surgeon: Larey Dresser, MD;  Location: [place] [facility];  Service: Cardiovascular;  Laterality: N/A;   RIGHT HEART CATH N/A 08/09/2018   Procedure: RIGHT HEART CATH;  Surgeon: Larey Dresser, MD;  Location: [place] [facility];  Service: Cardiovascular;  Laterality: N/A;   RIGHT/LEFT HEART CATH AND CORONARY ANGIOGRAPHY N/A 11/23/2016   Procedure: Right/Left Heart Cath and Coronary Angiography;  Surgeon: Larey Dresser, MD;  Location: [place] [facility];  Service: Cardiovascular;  Laterality: N/A;   TEE WITHOUT CARDIOVERSION N/A 06/12/2017   Procedure: TRANSESOPHAGEAL ECHOCARDIOGRAM (TEE);  Surgeon: Prescott Gum, Collier Salina, MD;  Location: [place];  Service: Open Heart Surgery;  Laterality: N/A;   TEE WITHOUT CARDIOVERSION N/A 02/28/2018   Procedure: TRANSESOPHAGEAL ECHOCARDIOGRAM (TEE);  Surgeon: Prescott Gum, Collier Salina, MD;  Location: [place];  Service: Open Heart Surgery;  Laterality: N/A;   TEMPORARY DIALYSIS CATHETER Left 05/24/2018   Procedure: TEMPORARY DIALYSIS CATHETER;  Surgeon: Marty Heck, MD;  Location: [place] [facility];  Service: Cardiovascular;  Laterality: Left;   THROMBECTOMY AND REVISION OF ARTERIOVENTOUS (AV) GORETEX  GRAFT Right 12/12/2017   Procedure: THROMBECTOMY ARTERIOVENTOUS (AV) GORETEX  GRAFT RIGHT UPPER ARM;  Surgeon: Conrad [place], MD;  Location: [place];  Service: Vascular;  Laterality: Right;   TRANSMETATARSAL AMPUTATION Right 04/30/2019   Procedure: RIGHT TRANSMETATARSAL AMPUTATION;  Surgeon: Waynetta Sandy, MD;  Location: [place];  Service: Vascular;  Laterality: Right;    Allergies  Allergen Reactions   Metformin And Related Diarrhea    Current Outpatient Medications  Medication Sig  Dispense Refill   acetaminophen (TYLENOL) 325 MG tablet Take 2 tablets (650 mg total) by mouth every 4 (four) hours as needed for headache or mild pain.     aspirin EC 81 MG tablet Take 1 tablet (81 mg total) by mouth daily. 90 tablet 3   atorvastatin (LIPITOR) 40 MG tablet Take 1 tablet (40 mg total) by mouth daily at 6 PM. 30 tablet 6   calcitRIOL (ROCALTROL) 0.5 MCG capsule Take 1 capsule (0.5 mcg total) by mouth every Monday, Wednesday, and Friday. 15 capsule 5   citalopram (CELEXA) 20 MG tablet Take 1 tablet (20 mg total) by mouth daily. 30 tablet 6   docusate sodium (COLACE) 100 MG capsule Take 1 capsule (100  mg total) by mouth daily as needed for mild constipation. (Patient taking differently: Take 100 mg by mouth daily. ) 10 capsule 0   dronabinol (MARINOL) 2.5 MG capsule Take 1 capsule (2.5 mg total) by mouth 2 (two) times daily before lunch and supper. 60 capsule 5   erythromycin (ERYTHROCIN) 250 MG tablet Take 250 mg by mouth 3 (three) times daily.     gabapentin (NEURONTIN) 300 MG capsule Take 1 capsule (300 mg total) by mouth 2 (two) times daily. 60 capsule 2   insulin glargine (LANTUS) 100 UNIT/ML injection Inject 0.1 mLs (10 Units total) into the skin daily. 10 mL 5   LORazepam (ATIVAN) 0.5 MG tablet Take 1 tablet (0.5 mg total) by mouth every 12 (twelve) hours as needed (nausea). 60 tablet 2   metoCLOPramide (REGLAN) 10 MG tablet Take 1 tablet (10 mg total) by mouth 3 (three) times daily. (Patient taking differently: Take 10 mg by mouth 3 (three) times daily with meals. ) 90 tablet 6   midodrine (PROAMATINE) 5 MG tablet Take 1 tablet (5 mg total) by mouth daily. Take 1 tablet before dialysis treatment (Patient taking differently: Take 5 mg by mouth every Monday, Wednesday, and Friday. Take 1 tablet before dialysis treatment) 30 tablet 6   multivitamin (RENA-VIT) TABS tablet Take 1 tablet by mouth daily.     NOVOLOG 100 UNIT/ML injection INJECT 7 UNITS INTO THE SKIN DAILY  WITH SUPPER. (Patient taking differently: Inject 7 Units into the skin daily with supper. ) 10 mL 3   oxyCODONE (ROXICODONE) 5 MG immediate release tablet Take 1 tablet (5 mg total) by mouth 2 (two) times daily as needed for severe pain. Take 1-2 tablets every 4-6 hours for severe pain. 30 tablet 0   pantoprazole (PROTONIX) 40 MG tablet Take 1 tablet (40 mg total) by mouth 2 (two) times daily before lunch and supper. 60 tablet 11   promethazine (PHENERGAN) 12.5 MG tablet Take 1 tablet (12.5 mg total) by mouth every 6 (six) hours as needed for nausea or vomiting. 30 tablet 3   sevelamer carbonate (RENVELA) 0.8 g PACK packet Take 0.8 g by mouth 3 (three) times daily with meals. 270 each 1   warfarin (COUMADIN) 5 MG tablet Hold warfarin tonight and tomorrow (06/16/2019 and 06/17/2019). Check INR on 06/18/2019 and start medications based on readings  (previously on 7.88m on MWF and 526mall other days). 50 tablet 6   No current facility-administered medications for this visit.     ROS: See HPI for pertinent positives and negatives.   Physical Examination  Vitals:   07/17/19 1257  BP: (!) 100/50  Resp: 16  Temp: 99.2 F (37.3 C)  TempSrc: Oral  Weight: 180 lb 12.4 oz (82 kg)  Height: 5' 7"  (1.702 m)  Unable to obtain SAO2 from fingers Body mass index is 28.31 kg/m.  General: A&O x 3, WDWN, frail appearing male, accompanied by his son. Gait: seated in w/c HEENT: No gross abnormalities.  Pulmonary: Respirations are non labored at rest, breath sounds: hum of LVAD Cardiac: heart sounds: hum of LVAD    Left IJ TDC in place  Radial pulses are not palpable bilaterally   Adominal aortic pulse is not palpable                         VASCULAR EXAM: Extremities with ischemic changes in left foot, with dry gangrene left forefoot and toes; without open wounds. Right AKA is healing  well, few remaining scabs, no swelling, no erythema    Left foot, dorsal apect   Left foot, plantar  aspect                                                                                                            LE Pulses Right Left       FEMORAL  not palpable, seated  not palpable        POPLITEAL  AKA   not palpable       POSTERIOR TIBIAL  AKA   not palpable, no Doppler signal        DORSALIS PEDIS      ANTERIOR TIBIAL AKA  not palpable, no Doppler signal    Abdomen: soft, NT, no palpable masses. Skin: no rashes,see Extremities  Musculoskeletal: no muscle wasting or atrophy. See Extremities   Neurologic: A&O X 3; appropriate affect, Sensation is normal; MOTOR FUNCTION:  moving all extremities equally, motor strength 5/5 throughout. Speech is fluent/normal. CN 2-12 intact. Psychiatric: Thought content is normal, mood appropriate for clinical situation.    ASSESSMENT: [name] is a 53 y.o. 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 Donnetta Hutching for  gangrene right or foot. He is also s/p Left great toe amputation including metatarsal head on 05-29-19 by Dr. Trula Slade for Left great toe ischemia.  He is also s/p placement of Left SFA stent (6 mm x 60 mm drug-coated Eluvia stent postdilated with a 5 mm Mustang) on 05-02-19 by Dr. Carlis Abbott for critical limb ischemia left lower extremity with tissue loss.   Pt returns today with c/o pain and dry gangrene in left forefoot for several, is worsening per pt.  He states that betadine dressing changes occur for his left foot, is recently home from [facility].  Pt denies fever or chills.   Right AKA is healing well.  He is on an LVAD.  He has ESRD, dialyzes via left IJ TDC.   Dr. Oneida Alar spoke with pt and son and examined pt.  Will schedule left AKA on Tuesday 07-23-19 by Dr. Oneida Alar. Will coordinate with his coumadin clinic, Advanced Heart Failure clinic, and nephrologist.    DATA  Left LE Arterial Duplex (07-17-19): +-----------+--------+-----+--------+--------+--------+  LEFT        PSV  cm/s Ratio Stenosis Waveform Comments  +-----------+--------+-----+--------+--------+--------+  CFA Distal  53                      LVAD               +-----------+--------+-----+--------+--------+--------+  DFA         49                                         +-----------+--------+-----+--------+--------+--------+  SFA Prox    33                                         +-----------+--------+-----+--------+--------+--------+  SFA Mid     35                                         +-----------+--------+-----+--------+--------+--------+  SFA Distal  24                                         +-----------+--------+-----+--------+--------+--------+  POP Prox    25                                         +-----------+--------+-----+--------+--------+--------+  POP Distal  25                                         +-----------+--------+-----+--------+--------+--------+  ATA Distal  35                                         +-----------+--------+-----+--------+--------+--------+  PTA Distal  32                                         +-----------+--------+-----+--------+--------+--------+  PERO Distal 31                                         +-----------+--------+-----+--------+--------+--------+ Summary: Right: Patent left lower extremity arterial system with no evidence of stenosis or occlusion. Waveforms adversely affected by VAD and are non diagnostic.   PLAN:  Will schedule left AKA on Tuesday 07-23-19 by Dr. Oneida Alar. Will coordinate with his coumadin clinic, Advanced Heart Failure clinic, and nephrologist.   Clemon Chambers, RN, MSN, FNP-C [facility] Phone: [phone]  Clinic MD: Oneida Alar  07/17/19 1:12 PM

## 2019-07-17 NOTE — Telephone Encounter (Signed)
Instructed patient that Dr. Oneida Alar want Coumadin held x 5 days prior to surgery. Dr. Aundra Dubin and LVAD clinic has been notified of pending surgery and Anti-coagultion will be managed by their service. Patient instructed to go to [facility] for  Nasal swab on 07/19/2019 or prior to hospitalization.

## 2019-07-17 NOTE — Progress Notes (Signed)
[facility] [place]   CC: Left foot pain and dry gangrene, peripheral artery occlusive disease  History of Present Illness [name] is a 52 y.o. 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 Donnetta Hutching for  gangrene right or foot. He is also s/p Left great toe amputation including metatarsal head on 05-29-19 by Dr. Trula Slade for Left great toe ischemia.  He is also s/p placement of Left SFA stent (6 mm x 60 mm drug-coated Eluvia stent postdilated with a 5 mm Mustang) on 05-02-19 by Dr. Carlis Abbott for critical limb ischemia left lower extremity with tissue loss.   Pt returns today with c/o pain and dry gangrene in left forefoot for several, is worsening per pt.  He states that betadine dressing changes occur for his left foot, is recently home from [facility].  Pt denies fever or chills.   He also has ESRD and dialyzes on MWF via left IJ TDC at [facility] [place].  Pt states he has had accesses in both arms which have failed.   He wears an LVAD.   Diabetic: Yes Tobacco use: non-smoker  Pt meds include: Statin :yes Betablocker: No ASA: Yes Other anticoagulants/antiplatelets: warfarin   Past Medical History:  Diagnosis Date   Angina    ASCVD (arteriosclerotic cardiovascular disease)    , Anterior infarction 2005, LAD diagonal bifurcation intervention 03/2004   Automatic implantable cardiac defibrillator -St. Jude's        Benign neoplasm of colon    CHF (congestive heart failure) (HCC)    Chronic systolic heart failure (HCC)    Coronary artery disease     Widely patent previously placed stents in the left anterior    Crohn's disease ([place])    Deep venous thrombosis (HCC)    Recurrent-on Coumadin   Dialysis patient ([place])    Dyspnea    Gastroparesis    GERD (gastroesophageal reflux disease)    High cholesterol    Hyperlipidemia    Hypersomnolent    Previous diagnosis of narcolepsy   Hypertension, essential     Ischemic cardiomyopathy    Ejection fraction 15-20% catheterization 2010   Type II or unspecified type diabetes mellitus without mention of complication, not stated as uncontrolled    Unspecified gastritis and gastroduodenitis without mention of hemorrhage     Social History Social History   Tobacco Use   Smoking status: Never Smoker   Smokeless tobacco: Never Used  Substance Use Topics   Alcohol use: No   Drug use: No    Family History Family History  Problem Relation Age of Onset   Colon polyps Mother    Diabetes Mother    Heart attack Father    Diabetes Father    Stroke Paternal Grandfather    Colitis Sister    Heart disease Brother    Colitis Sister    Colon cancer Neg Hx     Past Surgical History:  Procedure Laterality Date   A/V SHUNTOGRAM N/A 05/24/2018   Procedure: A/V SHUNTOGRAM;  Surgeon: Marty Heck, MD;  Location: [place] [facility];  Service: Cardiovascular;  Laterality: N/A;   ABDOMINAL AORTOGRAM W/LOWER EXTREMITY N/A 03/18/2019   Procedure: ABDOMINAL AORTOGRAM W/LOWER EXTREMITY;  Surgeon: Waynetta Sandy, MD;  Location: [place] [facility];  Service: Cardiovascular;  Laterality: N/A;   AMPUTATION Right 03/21/2019   Procedure: AMPUTATION DIGIT RIGHT FOURTH TOE;  Surgeon: Waynetta Sandy, MD;  Location: [place];  Service: Vascular;  Laterality: Right;   AMPUTATION Left 05/29/2019   Procedure: AMPUTATION LEFT GREAT TOE;  Surgeon: Serafina Mitchell, MD;  Location: [place];  Service: Vascular;  Laterality: Left;   AMPUTATION Right 06/02/2019   Procedure: ABOVE KNEE AMPUTATION;  Surgeon: Rosetta Posner, MD;  Location: [place];  Service: Vascular;  Laterality: Right;   APPENDECTOMY     AV FISTULA PLACEMENT Right 06/26/2017   Procedure: ARTERIOVENOUS (AV) FISTULA CREATION VERSUS GRAFT RIGHT  ARM;  Surgeon: Conrad Wilmington, MD;  Location: [place];  Service: Vascular;  Laterality: Right;   AV FISTULA PLACEMENT Right 10/31/2017    Procedure: INSERTION OF ARTERIOVENOUS (AV) ARTEGRAFT,  RIGHT UPPER ARM;  Surgeon: Conrad [place], MD;  Location: [place];  Service: Vascular;  Laterality: Right;   AV FISTULA PLACEMENT Left 05/29/2018   Procedure: ARTERIOVENOUS (AV) FISTULA CREATION  LEFT UPPER EXTREMITY;  Surgeon: Waynetta Sandy, MD;  Location: [place];  Service: Vascular;  Laterality: Left;   AV FISTULA PLACEMENT Left 08/09/2018   Procedure: INSERTION OF ARTERIOVENOUS (AV) GORE-TEX GRAFT LEFT UPPER ARM;  Surgeon: Waynetta Sandy, MD;  Location: [place];  Service: Vascular;  Laterality: Left;   CARDIAC DEFIBRILLATOR PLACEMENT  2010   St. Jude ICD   CENTRAL LINE INSERTION Right 05/24/2018   Procedure: CENTRAL LINE INSERTION;  Surgeon: Marty Heck, MD;  Location: [place] [facility];  Service: Cardiovascular;  Laterality: Right;   COLECTOMY  ~ 2003   "for Crohn's" ascending colon.     CORONARY STENT INTERVENTION N/A 11/25/2016   Procedure: Coronary Stent Intervention;  Surgeon: Leonie Man, MD;  Location: [place] [facility];  Service: Cardiovascular;  Laterality: N/A;   EP IMPLANTABLE DEVICE N/A 09/14/2016   Procedure: ICD Generator Changeout;  Surgeon: Deboraha Sprang, MD;  Location: [place] [facility];  Service: Cardiovascular;  Laterality: N/A;   ESOPHAGOGASTRODUODENOSCOPY N/A 07/12/2017   Procedure: ESOPHAGOGASTRODUODENOSCOPY (EGD);  Surgeon: Jerene Bears, MD;  Location: [facility] END[facility];  Service: Gastroenterology;  Laterality: N/A;  VAD pt so VAD team will need to accompany pt.    FETAL SURGERY FOR CONGENITAL HERNIA     ????? pt has no knowledge of this.[name]     FISTULOGRAM Left 08/09/2018   Procedure: FISTULOGRAM LEFT ARM;  Surgeon: Waynetta Sandy, MD;  Location: [place];  Service: Vascular;  Laterality: Left;   INGUINAL HERNIA REPAIR     INSERTION OF DIALYSIS CATHETER Right 06/26/2017   Procedure: INSERTION OF TUNNELED  DIALYSIS CATHETER RIGHT INTERNAL JUGULAR;  Surgeon: Conrad Hague,  MD;  Location: [place];  Service: Vascular;  Laterality: Right;   INSERTION OF IMPLANTABLE LEFT VENTRICULAR ASSIST DEVICE N/A 06/12/2017   Procedure: INSERTION OF IMPLANTABLE LEFT VENTRICULAR ASSIST [place] 3;  Surgeon: Ivin Poot, MD;  Location: [place];  Service: Open Heart Surgery;  Laterality: N/A;  HM3 LVAD  CIRC ARREST  NITRIC OXIDE   INSERTION OF IMPLANTABLE LEFT VENTRICULAR ASSIST DEVICE N/A 02/28/2018   Procedure: REDO INSERTION OF IMPLANTABLE LEFT VENTRICULAR ASSIST DEVICE - HEARTMATE 3;  Surgeon: Ivin Poot, MD;  Location: [place];  Service: Open Heart Surgery;  Laterality: N/A;   IR REMOVAL TUN CV CATH W/O FL  02/26/2018   IR THORACENTESIS ASP PLEURAL SPACE W/IMG GUIDE  06/28/2017   LIGATION OF ARTERIOVENOUS  FISTULA Right 10/31/2017   Procedure: LIGATION OF BRACHIO-BASILIC VEIN TRANSPOSITION RIGHT ARM;  Surgeon: Conrad Clifton Forge, MD;  Location: [place];  Service: Vascular;  Laterality: Right;  LOWER EXTREMITY ANGIOGRAPHY  05/02/2019   Procedure: Lower Extremity Angiography;  Surgeon: Marty Heck, MD;  Location: [place] [facility];  Service: Cardiovascular;;   MULTIPLE EXTRACTIONS WITH ALVEOLOPLASTY N/A 06/08/2017   Procedure: MULTIPLE EXTRACTION WITH ALVEOLOPLASTY AND PRE PROSTHETIC SURGERY AS NEEDED;  Surgeon: Lenn Cal, DDS;  Location: [place];  Service: Oral Surgery;  Laterality: N/A;   PERIPHERAL VASCULAR BALLOON ANGIOPLASTY  03/18/2019   Procedure: PERIPHERAL VASCULAR BALLOON ANGIOPLASTY;  Surgeon: Waynetta Sandy, MD;  Location: [place] [facility];  Service: Cardiovascular;;  RT PT   PERIPHERAL VASCULAR INTERVENTION  03/18/2019   Procedure: PERIPHERAL VASCULAR INTERVENTION;  Surgeon: Waynetta Sandy, MD;  Location: [place] [facility];  Service: Cardiovascular;;  RT SFA   PERIPHERAL VASCULAR INTERVENTION  05/02/2019   Procedure: PERIPHERAL VASCULAR INTERVENTION;  Surgeon: Marty Heck, MD;  Location: [place] [facility];   Service: Cardiovascular;;  Lt. SFA   RIGHT HEART CATH N/A 06/07/2017   Procedure: RIGHT HEART CATH;  Surgeon: Larey Dresser, MD;  Location: [place] [facility];  Service: Cardiovascular;  Laterality: N/A;   RIGHT HEART CATH N/A 02/08/2018   Procedure: RIGHT HEART CATH;  Surgeon: Larey Dresser, MD;  Location: [place] [facility];  Service: Cardiovascular;  Laterality: N/A;   RIGHT HEART CATH N/A 08/09/2018   Procedure: RIGHT HEART CATH;  Surgeon: Larey Dresser, MD;  Location: [place] [facility];  Service: Cardiovascular;  Laterality: N/A;   RIGHT/LEFT HEART CATH AND CORONARY ANGIOGRAPHY N/A 11/23/2016   Procedure: Right/Left Heart Cath and Coronary Angiography;  Surgeon: Larey Dresser, MD;  Location: [place] [facility];  Service: Cardiovascular;  Laterality: N/A;   TEE WITHOUT CARDIOVERSION N/A 06/12/2017   Procedure: TRANSESOPHAGEAL ECHOCARDIOGRAM (TEE);  Surgeon: Prescott Gum, Collier Salina, MD;  Location: [place];  Service: Open Heart Surgery;  Laterality: N/A;   TEE WITHOUT CARDIOVERSION N/A 02/28/2018   Procedure: TRANSESOPHAGEAL ECHOCARDIOGRAM (TEE);  Surgeon: Prescott Gum, Collier Salina, MD;  Location: [place];  Service: Open Heart Surgery;  Laterality: N/A;   TEMPORARY DIALYSIS CATHETER Left 05/24/2018   Procedure: TEMPORARY DIALYSIS CATHETER;  Surgeon: Marty Heck, MD;  Location: [place] [facility];  Service: Cardiovascular;  Laterality: Left;   THROMBECTOMY AND REVISION OF ARTERIOVENTOUS (AV) GORETEX  GRAFT Right 12/12/2017   Procedure: THROMBECTOMY ARTERIOVENTOUS (AV) GORETEX  GRAFT RIGHT UPPER ARM;  Surgeon: Conrad Temelec, MD;  Location: [place];  Service: Vascular;  Laterality: Right;   TRANSMETATARSAL AMPUTATION Right 04/30/2019   Procedure: RIGHT TRANSMETATARSAL AMPUTATION;  Surgeon: Waynetta Sandy, MD;  Location: [place];  Service: Vascular;  Laterality: Right;    Allergies  Allergen Reactions   Metformin And Related Diarrhea    Current Outpatient Medications  Medication Sig  Dispense Refill   acetaminophen (TYLENOL) 325 MG tablet Take 2 tablets (650 mg total) by mouth every 4 (four) hours as needed for headache or mild pain.     aspirin EC 81 MG tablet Take 1 tablet (81 mg total) by mouth daily. 90 tablet 3   atorvastatin (LIPITOR) 40 MG tablet Take 1 tablet (40 mg total) by mouth daily at 6 PM. 30 tablet 6   calcitRIOL (ROCALTROL) 0.5 MCG capsule Take 1 capsule (0.5 mcg total) by mouth every Monday, Wednesday, and Friday. 15 capsule 5   citalopram (CELEXA) 20 MG tablet Take 1 tablet (20 mg total) by mouth daily. 30 tablet 6   docusate sodium (COLACE) 100 MG capsule Take 1 capsule (100  mg total) by mouth daily as needed for mild constipation. (Patient taking differently: Take 100 mg by mouth daily. ) 10 capsule 0   dronabinol (MARINOL) 2.5 MG capsule Take 1 capsule (2.5 mg total) by mouth 2 (two) times daily before lunch and supper. 60 capsule 5   erythromycin (ERYTHROCIN) 250 MG tablet Take 250 mg by mouth 3 (three) times daily.     gabapentin (NEURONTIN) 300 MG capsule Take 1 capsule (300 mg total) by mouth 2 (two) times daily. 60 capsule 2   insulin glargine (LANTUS) 100 UNIT/ML injection Inject 0.1 mLs (10 Units total) into the skin daily. 10 mL 5   LORazepam (ATIVAN) 0.5 MG tablet Take 1 tablet (0.5 mg total) by mouth every 12 (twelve) hours as needed (nausea). 60 tablet 2   metoCLOPramide (REGLAN) 10 MG tablet Take 1 tablet (10 mg total) by mouth 3 (three) times daily. (Patient taking differently: Take 10 mg by mouth 3 (three) times daily with meals. ) 90 tablet 6   midodrine (PROAMATINE) 5 MG tablet Take 1 tablet (5 mg total) by mouth daily. Take 1 tablet before dialysis treatment (Patient taking differently: Take 5 mg by mouth every Monday, Wednesday, and Friday. Take 1 tablet before dialysis treatment) 30 tablet 6   multivitamin (RENA-VIT) TABS tablet Take 1 tablet by mouth daily.     NOVOLOG 100 UNIT/ML injection INJECT 7 UNITS INTO THE SKIN DAILY  WITH SUPPER. (Patient taking differently: Inject 7 Units into the skin daily with supper. ) 10 mL 3   oxyCODONE (ROXICODONE) 5 MG immediate release tablet Take 1 tablet (5 mg total) by mouth 2 (two) times daily as needed for severe pain. Take 1-2 tablets every 4-6 hours for severe pain. 30 tablet 0   pantoprazole (PROTONIX) 40 MG tablet Take 1 tablet (40 mg total) by mouth 2 (two) times daily before lunch and supper. 60 tablet 11   promethazine (PHENERGAN) 12.5 MG tablet Take 1 tablet (12.5 mg total) by mouth every 6 (six) hours as needed for nausea or vomiting. 30 tablet 3   sevelamer carbonate (RENVELA) 0.8 g PACK packet Take 0.8 g by mouth 3 (three) times daily with meals. 270 each 1   warfarin (COUMADIN) 5 MG tablet Hold warfarin tonight and tomorrow (06/16/2019 and 06/17/2019). Check INR on 06/18/2019 and start medications based on readings  (previously on 7.28m on MWF and 584mall other days). 50 tablet 6   No current facility-administered medications for this visit.     ROS: See HPI for pertinent positives and negatives.   Physical Examination  Vitals:   07/17/19 1257  BP: (!) 100/50  Resp: 16  Temp: 99.2 F (37.3 C)  TempSrc: Oral  Weight: 180 lb 12.4 oz (82 kg)  Height: 5' 7"  (1.702 m)  Unable to obtain SAO2 from fingers Body mass index is 28.31 kg/m.  General: A&O x 3, WDWN, frail appearing male, accompanied by his son. Gait: seated in w/c HEENT: No gross abnormalities.  Pulmonary: Respirations are non labored at rest, breath sounds: hum of LVAD Cardiac: heart sounds: hum of LVAD    Left IJ TDC in place  Radial pulses are not palpable bilaterally   Adominal aortic pulse is not palpable                         VASCULAR EXAM: Extremities with ischemic changes in left foot, with dry gangrene left forefoot and toes; without open wounds. Right AKA is healing  well, few remaining scabs, no swelling, no erythema    Left foot, dorsal apect   Left foot, plantar  aspect                                                                                                            LE Pulses Right Left       FEMORAL  not palpable, seated  not palpable        POPLITEAL  AKA   not palpable       POSTERIOR TIBIAL  AKA   not palpable, no Doppler signal        DORSALIS PEDIS      ANTERIOR TIBIAL AKA  not palpable, no Doppler signal    Abdomen: soft, NT, no palpable masses. Skin: no rashes,see Extremities  Musculoskeletal: no muscle wasting or atrophy. See Extremities   Neurologic: A&O X 3; appropriate affect, Sensation is normal; MOTOR FUNCTION:  moving all extremities equally, motor strength 5/5 throughout. Speech is fluent/normal. CN 2-12 intact. Psychiatric: Thought content is normal, mood appropriate for clinical situation.    ASSESSMENT: [name] is a 53 y.o. 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 Donnetta Hutching for  gangrene right or foot. He is also s/p Left great toe amputation including metatarsal head on 05-29-19 by Dr. Trula Slade for Left great toe ischemia.  He is also s/p placement of Left SFA stent (6 mm x 60 mm drug-coated Eluvia stent postdilated with a 5 mm Mustang) on 05-02-19 by Dr. Carlis Abbott for critical limb ischemia left lower extremity with tissue loss.   Pt returns today with c/o pain and dry gangrene in left forefoot for several, is worsening per pt.  He states that betadine dressing changes occur for his left foot, is recently home from [facility].  Pt denies fever or chills.   Right AKA is healing well.  He is on an LVAD.  He has ESRD, dialyzes via left IJ TDC.   Dr. Oneida Alar spoke with pt and son and examined pt.  Will schedule left AKA on Tuesday 07-23-19 by Dr. Oneida Alar. Will coordinate with his coumadin clinic, Advanced Heart Failure clinic, and nephrologist.    DATA  Left LE Arterial Duplex (07-17-19): +-----------+--------+-----+--------+--------+--------+  LEFT        PSV  cm/s Ratio Stenosis Waveform Comments  +-----------+--------+-----+--------+--------+--------+  CFA Distal  53                      LVAD               +-----------+--------+-----+--------+--------+--------+  DFA         49                                         +-----------+--------+-----+--------+--------+--------+  SFA Prox    33                                         +-----------+--------+-----+--------+--------+--------+  SFA Mid     35                                         +-----------+--------+-----+--------+--------+--------+  SFA Distal  24                                         +-----------+--------+-----+--------+--------+--------+  POP Prox    25                                         +-----------+--------+-----+--------+--------+--------+  POP Distal  25                                         +-----------+--------+-----+--------+--------+--------+  ATA Distal  35                                         +-----------+--------+-----+--------+--------+--------+  PTA Distal  32                                         +-----------+--------+-----+--------+--------+--------+  PERO Distal 31                                         +-----------+--------+-----+--------+--------+--------+ Summary: Right: Patent left lower extremity arterial system with no evidence of stenosis or occlusion. Waveforms adversely affected by VAD and are non diagnostic.   PLAN:  Will schedule left AKA on Tuesday 07-23-19 by Dr. Oneida Alar. Will coordinate with his coumadin clinic, Advanced Heart Failure clinic, and nephrologist.   Clemon Chambers, RN, MSN, FNP-C [facility] Phone: [phone]  Clinic MD: Oneida Alar  07/17/19 1:12 PM

## 2019-07-18 ENCOUNTER — Encounter (HOSPITAL_COMMUNITY): Payer: Self-pay

## 2019-07-18 ENCOUNTER — Ambulatory Visit (HOSPITAL_COMMUNITY): Payer: Self-pay | Admitting: Pharmacist

## 2019-07-18 ENCOUNTER — Telehealth: Payer: Self-pay | Admitting: *Deleted

## 2019-07-18 ENCOUNTER — Encounter: Payer: BC Managed Care – PPO | Admitting: Physical Therapy

## 2019-07-18 ENCOUNTER — Telehealth: Payer: Self-pay | Admitting: Physical Therapy

## 2019-07-18 ENCOUNTER — Telehealth (HOSPITAL_COMMUNITY): Payer: Self-pay | Admitting: *Deleted

## 2019-07-18 ENCOUNTER — Ambulatory Visit (HOSPITAL_COMMUNITY)
Admission: RE | Admit: 2019-07-18 | Discharge: 2019-07-18 | Disposition: A | Discharge status: Home / Self Care | Payer: BC Managed Care – PPO | Source: Ambulatory Visit | Attending: Internal Medicine | Admitting: Internal Medicine

## 2019-07-18 ENCOUNTER — Other Ambulatory Visit (HOSPITAL_COMMUNITY): Payer: Self-pay | Admitting: *Deleted

## 2019-07-18 ENCOUNTER — Other Ambulatory Visit (HOSPITAL_COMMUNITY): Payer: Self-pay | Admitting: Unknown Physician Specialty

## 2019-07-18 VITALS — BP 109/91 | HR 98 | Ht 68.0 in

## 2019-07-18 DIAGNOSIS — Z7982 Long term (current) use of aspirin: Secondary | ICD-10-CM | POA: Insufficient documentation

## 2019-07-18 DIAGNOSIS — R52 Pain, unspecified: Secondary | ICD-10-CM | POA: Diagnosis not present

## 2019-07-18 DIAGNOSIS — I255 Ischemic cardiomyopathy: Secondary | ICD-10-CM | POA: Diagnosis not present

## 2019-07-18 DIAGNOSIS — K5090 Crohn's disease, unspecified, without complications: Secondary | ICD-10-CM | POA: Diagnosis not present

## 2019-07-18 DIAGNOSIS — I739 Peripheral vascular disease, unspecified: Secondary | ICD-10-CM

## 2019-07-18 DIAGNOSIS — Z89611 Acquired absence of right leg above knee: Secondary | ICD-10-CM | POA: Insufficient documentation

## 2019-07-18 DIAGNOSIS — I5022 Chronic systolic (congestive) heart failure: Secondary | ICD-10-CM | POA: Diagnosis not present

## 2019-07-18 DIAGNOSIS — E1151 Type 2 diabetes mellitus with diabetic peripheral angiopathy without gangrene: Secondary | ICD-10-CM | POA: Diagnosis not present

## 2019-07-18 DIAGNOSIS — I998 Other disorder of circulatory system: Secondary | ICD-10-CM

## 2019-07-18 DIAGNOSIS — I252 Old myocardial infarction: Secondary | ICD-10-CM | POA: Insufficient documentation

## 2019-07-18 DIAGNOSIS — Z95810 Presence of automatic (implantable) cardiac defibrillator: Secondary | ICD-10-CM | POA: Insufficient documentation

## 2019-07-18 DIAGNOSIS — K3184 Gastroparesis: Secondary | ICD-10-CM | POA: Insufficient documentation

## 2019-07-18 DIAGNOSIS — Z95811 Presence of heart assist device: Secondary | ICD-10-CM

## 2019-07-18 DIAGNOSIS — N186 End stage renal disease: Secondary | ICD-10-CM | POA: Diagnosis not present

## 2019-07-18 DIAGNOSIS — E1122 Type 2 diabetes mellitus with diabetic chronic kidney disease: Secondary | ICD-10-CM | POA: Insufficient documentation

## 2019-07-18 DIAGNOSIS — Z794 Long term (current) use of insulin: Secondary | ICD-10-CM | POA: Insufficient documentation

## 2019-07-18 DIAGNOSIS — Z5181 Encounter for therapeutic drug level monitoring: Secondary | ICD-10-CM

## 2019-07-18 DIAGNOSIS — I2510 Atherosclerotic heart disease of native coronary artery without angina pectoris: Secondary | ICD-10-CM | POA: Diagnosis not present

## 2019-07-18 DIAGNOSIS — Z789 Other specified health status: Secondary | ICD-10-CM

## 2019-07-18 DIAGNOSIS — E1143 Type 2 diabetes mellitus with diabetic autonomic (poly)neuropathy: Secondary | ICD-10-CM | POA: Diagnosis not present

## 2019-07-18 DIAGNOSIS — Z79899 Other long term (current) drug therapy: Secondary | ICD-10-CM | POA: Insufficient documentation

## 2019-07-18 DIAGNOSIS — Z7901 Long term (current) use of anticoagulants: Secondary | ICD-10-CM | POA: Insufficient documentation

## 2019-07-18 DIAGNOSIS — I132 Hypertensive heart and chronic kidney disease with heart failure and with stage 5 chronic kidney disease, or end stage renal disease: Secondary | ICD-10-CM | POA: Insufficient documentation

## 2019-07-18 DIAGNOSIS — E785 Hyperlipidemia, unspecified: Secondary | ICD-10-CM | POA: Insufficient documentation

## 2019-07-18 LAB — POCT INR: INR: 3.6 — AB (ref 2.0–3.0)

## 2019-07-18 NOTE — Telephone Encounter (Signed)
-----   Message from Willy Eddy, RN sent at 07/18/2019 11:26 AM EST ----- Regarding: FW: Assistance need to coordinate care for surgery for 07/23/2019.  ----- Message ----- From: Willy Eddy, RN Sent: 07/17/2019   4:31 PM EST To: Larey Dresser, MD, Amy Estrella Deeds, NP, # Subject: Assistance need to coordinate care for surge#  After his appointment at [facility] today, Mr. Kinner is scheduled for surgery with Dr. Oneida Alar for Left above knee amputation for Tuesday 07/23/2019. He will need to hold Coumadin for 5 days pre-op. Patient knows to expect a call from your service regarding admission prior to this surgery. He was instructed to go to the [facility] for nasal swab on Friday at 12 noon OR prior to going to the hospital. Please contact me if there is anything further I can do to assist with this admission or any questions. Once again I appreciate your help. Becky

## 2019-07-18 NOTE — Telephone Encounter (Signed)
Called pt re: following:  Covid testing scheduled Friday, 07/19/19 at noon at [facility].  Report to [facility] Radiology Department Monday, 07/22/19 for scheduled PICC line placement with Interventional Radiology.   Pt will be admitted to Dr. Aundra Dubin following procedure. Admission called.  Zada Girt RN, [place] Coordinator [phone]

## 2019-07-18 NOTE — Progress Notes (Addendum)
Patient presents for hospital discharge follow up in [facility] today with his son. Reports no problems with VAD equipment or concerns with drive line.   Unable to draw labs today due to difficult venous access. Labs drawn at [place] arrives in a wheel chair today. 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   Pt was discharged from hospital on 10/11//20 to [facility] for rehabilitation purposes. He reports he was discharged from [facility] on Friday, 07/11/19. Per our PharmD, his home INR has been supratherapeutic, pt is currently holding his warfarin. Audry Riles, PharmD in room and spoke with patient and his son. She instructed him to check home INR today and call her with results, unable to stick patient for labs today.   Pt had f/u with Vascular surgery OP on 07/17/19 with scheduled left above knee amputation on Tuesday, 07/23/19 per Dr. Oneida Alar. Pt c/o extreme pain of right foot, unable to bear weight. He asked Dr. Aundra Dubin for pain med, written Rx for Oxy provided to patient.       Planned PICC line placement with IR on Monday, 07/22/19 at 12 noon followed by admission to Dr. Claris Gladden service. Scheduled 1st case in OR Tues, 07/23/19 with Dr. Oneida Alar for left AKA. Pt will complete OP dialysis at [facility] that am and report to Radiology at 11:30 for scheduled IR procedure.    Vital Signs:  Doppler Pressure  120 Automatc BP: 109/91 (97) HR:  98 SPO2: 100% on RA  Weight: UTO  Last weight: 189.8 lb  VAD Indication: Destination therapy  VAD interrogation & Equipment Management: Speed: 5500 Flow: 4.1 Power: 4.5w    PI: 4.0  Alarms: none Events: 17 on 07/17/19 (dialysis day) Hct: 26  Fixed speed 5500 Low speed limit: 5200  Primary Controller:  Replace back up battery in 14 months. Back up controller:   Replace back up battery in   mos  Annual Equipment Maintenance on UBC/PM was performed on  06/2017 - pt did not bring equipment for annual maintenance.   I reviewed the LVAD parameters from today and compared the results to the patient's prior recorded data. LVAD interrogation was NEGATIVE for significant power changes, NEGATIVE for clinical alarms and STABLE for PI events/speed drops. No programming changes were made and pump is functioning within specified parameters. Pt is performing daily controller and system monitor self tests along with completing weekly and monthly maintenance for LVAD equipment.  LVAD equipment check completed and is in good working order. Back-up equipment present.    Exit Site Care: Drive line is being maintained weekly by daughter. CDI today. Anchor secure. Pt provided with 8 weekly kits today. Drive line anchor secure. Pt denies fever or chills.   Significant Events on VAD Support: -10/2016>> poss drive line infection, CT ABD neg, ID consult-doxy -11/09/16>> admit for poss drive line infection, IV abx -03/24/17>> doxy for poss drive line infection -05/04/17>> drive line debridement with wound-vac -06/2017>> drive line +proteus, IV abx, Bactrim x14 days  Device:St jude Dual Therapies: on 200 bpm Last check: 01/01/18 - 2 hrs Afl/Afib; EMI 04/02/18  BP & Labs: -  Doppler 120 -  Doppler is reflecting Modified systolic  Hgb not done today; difficult venous access - No S/S of bleeding. Specifically denies melena/BRBPR or nosebleeds.  LDH not done today, difficult venous access - established baseline of 245- 350. Denies tea-colored urine. No power elevations noted on interrogation.  Patient Instructions:  1. No medication changes today. 2. Pt to check home INR and contact Audry Riles, PharmD for warfarin dosing instruction.  3. Complete OP dialysis next Monday, 07/22/19, then report to [facility] at 11:30 for 12 noon PICC line placement per Interventional Radiology. You will be admitted post procedure to Dr. Claris Gladden service for scheduled right  AKA per Dr. Oneida Alar the following day, Tuesday, 07/23/19.   Zada Girt RN [place] Coordinator  Office: [phone]  24/7 Pager: [phone]     Cardiology: Dr. Aundra Dubin  HPI: 53 y.o. with history of CAD s/p anterior MI in 2010 and NSTEMI in 3/18 with DES to pRCA and mLAD and ischemic cardiomyopathy now s/p Heartmate 3 LVAD and ESRD presents for followup of CHF/LVAD.  Patient developed low output heart failure.  He was not deemed to be a good transplant candidate.  He was admitted in 10/18 and Heartmate 3 LVAD was placed.  He had baseline CKD stage 3 likely from diabetic nephropathy and CHF.  He developed peri-op AKI and ended up on dialysis.  No renal recovery to date.  St Jude ICD has been turned back on and ramp echo was done prior to discharge in 10/18, speed increased to 5600 rpm.   He was admitted in 11/18 with nausea/vomiting and abdominal pain.  This appears to have been due to diabetic gastroparesis (extensive GI evaluation). He had atrial flutter this admission that was converted to NSR with amiodarone.  He improved considerably with scheduled Reglan and erythromycin. He was re-admitted later in 11/18 with the same symptoms, suspect diabetic gastroparesis and was again treated with erythromycin.  He was not able to start domperidone due to prolonged QT interval. He had nausea again about a week ago but was able to manage at home with Phenergan suppositories.   Admitted 1/8 -> 09/16/17 for uncontrolled vomiting in setting of diabetic gastroparesis. Improved with phenergan suppositories and given erythromycin TID for 7 days post-discharge.    He was admitted in 2/19 for AV graft placement.  While in the hospital, we turned down his speed.  Since then, he has had considerably fewer PI events on HD days.    He was admitted in 4/19 with nausea/vomiting again, probably gastroparesis flare.   He was admitted in 5/19 with nausea/vomiting, likely gastroparesis flare, treated with erythromycin  course which he is finishing.   He has been evaluated at [facility] by Dr. Derrill Kay (gastroparesis specialist).   Gastric emptying study, surprisingly, was near normal. However, electrogastrogram showed poor gastric accomodation/capacity, "bradygastria."  Frequent small meals were recommended. Also, early am nausea was thought to be possible GERD.    Admitted 6/26 - 03/19/18 for emergent VAD exchange 03/01/18 in the setting of HM3 outflow graft kink. He required CVVHD post op, but was transtioned back to [facility] without problems. Course complicated by leukocytosis and fever. CT abd/chest negative for infection and blood cultures were negative. Post op pain well controlled withraretramadol. Able to tolerate diet. Evaluated by PT/OT and HH PT recommended for discharge. Ramp echo completed prior to discharge with speed optimization. Resumed81 mg ASA + coumadin with INR goal 2-3.  He was admitted 03/23/18 with nausea/gastroparesis symptoms and marked HTN at outpatient HD.  Erythromycin was restarted and gastroparesis symptoms were controlled with fall in BP and discharge on 03/25/18.   He was admitted in 9/19 for LUE AV fistula placement.  He was readmitted in 10/19 with his typical gastroparesis symptoms, unable to take po. The fistula later failed and  was replaced with a graft.  However, the graft was nonfunctional and developed a hematoma at the surgical site.    He was admitted in 12/19 with nausea/vomiting from HD. Also with LDH increased to 427 in setting of extensive hematoma around his left arm AV graft.  He has had tingling in his left hand and left grip is weak.    In 7/20, he was admitted with ischemic 4th toe on right foot.  Peripheral angiogram 03/18/19 showed 80% right SFA stenosis and occluded right PT. He had stenting of SFA and PTCA of PT with restoration of flow. However, he still needed amputation right 4th toe on 7/16 with intractable pain.  He was readmitted with pain in his right foot,  nausea, and vomiting. There was concern for ongoing right foot ischemia with all toes and a portion of the foot now dusky.  He was treated with IV vancomycin for possible osteomyelitis/infection, but thought that more likely ischemia could explain all the findings.  He was discharged home to followup with vascular surgery.   He was re-admitted in 8/20 with ongoing right foot pain and ended up having transmetatarsal amputation of the right foot.  Left leg angiogram was done with DES to left SFA, only 1 vessel runoff via severely diseased peroneal artery, unable to intervene.   He was readmitted in 9/20 with ongoing right foot/lower leg pain and had right AKA.  He was discharged to [facility].  He was just discharged home from [place].  He now has severe left foot pain.  As the picture above shows, there is now extensive gangrene in his left foot.  No fever/chills.  He saw vascular earlier this week, plan for left AKA next week.  He denies dyspnea. Able to do transfers to and from wheelchair. Main complaint continues to be severe pain left foot.  No nausea/vomiting and eating normally.   Reports taking Coumadin as prescribed and adherence to anticoagulation based dietary restrictions. Denies BRBPR or melena. No dark urine or hematuria.   Labs (11/18): hgb 10.4 => 8.7, LDH 213 Labs (08/04/17): hgb 9 Labs (1/19): HgbA1c 6.1 Labs (2/19): hgb 13.2 Labs (3/19): hgb 11.1 Labs (7/19): hgb 9.6 Labs (10/19: hgb 11.8 Labs (12/19): LDH 427 => 237, hgb 7.7  PMH: 1. Chronic systolic CHF: Ischemic cardiomyopathy. St Jude ICD.  - Echo (11/17) with EF 25-30%, moderate diastolic dysfunction, mild MR, mildly dilated RV with moderately decreased systolic function, peak RV-RA gradient 48 mmHg.  - Echo (3/18) with EF 25-30%, wall motion abnormalities, normal RV size and systolic function.  - Admission 3/18 with cardiogenic shock and AKI, required milrinone gtt.  - Echo (8/18) with EF 20-25%, mild LVH, moderate LV  dilation, mild MR.  - CPX (8/18) with peak VO2 12.4, VE/VCO2 slope 35, RER 1.05 => severe HR limitation.  - Heartmate 3 LVAD 10/18.  - Pump thrombosis in setting of HM3 outflow graft kink.  Pump exchange 6/19 for HM3.  2. CAD: Anterior MI 2005, had bifurcation stenting LAD/diagonal. Repeat cath 2010 with patent stents.  - NSTEMI 3/18: LHC with 99% ulcerated lesion proximal RCA, 95% mid LAD => PCI with DES to proximal RCA and mid LAD.  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  - Lower extremity venous dopplers (8/18): chronic DVT - V/Q scan (8/18): Normal, no PE.  4. Crohns disease: History of colectomy.  5. GERD 6. Diabetic gastroparesis: Admission 11/18 with abdominal pain, nausea/vomiting.  - [facility] workup 2019: Gastric emptying study near normal.  Negative  breath test (no evidence for small bowel bacterial overgrowth).  Electrogastrogram showed poor gastric accomodation/capacity, "bradygastria."  7. HTN 8. Hyperlipidemia 9. Type II diabetes  10. ESRD: Post-op LVAD surgery.  He has had failed AV fistula and AV graft, now dialyzing via catheter . 11. Carotid dopplers (8/18): mild bilateral stenosis.  12. PFTs (8/18): Mild restriction (no IDL on CT chest).  13. Lung nodules: CT chest 8/18 with small bilateral nodules.  14. PAD: ABIs with moderate bilateral disease in 8/18.  - Peripheral angiogram 03/18/19 showed 80% right SFA stenosis and occluded right PT. He had stenting of SFA and PTCA of PT with restoration of flow. However, he still needed amputation right 4th toe on 03/21/19 with intractable pain.  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  - Peripheral angiogram left leg: DES to left SFA, only 1 vessel runoff via severely diseased peroneal artery, unable to intervene. 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  15. Atrial flutter: In 11/18 associated with nausea/vomiting from gastroparesis.   Current Outpatient Medications  Medication Sig Dispense Refill  . acetaminophen (TYLENOL) 325 MG tablet Take 2 tablets (650 mg  total) by mouth every 4 (four) hours as needed for headache or mild pain.    [name] aspirin EC 81 MG tablet Take 1 tablet (81 mg total) by mouth daily. 90 tablet 3  . atorvastatin (LIPITOR) 40 MG tablet Take 1 tablet (40 mg total) by mouth daily at 6 PM. 30 tablet 6  . calcitRIOL (ROCALTROL) 0.5 MCG capsule Take 1 capsule (0.5 mcg total) by mouth every Monday, Wednesday, and Friday. 15 capsule 5  . citalopram (CELEXA) 20 MG tablet Take 1 tablet (20 mg total) by mouth daily. 30 tablet 6  . docusate sodium (COLACE) 100 MG capsule Take 1 capsule (100 mg total) by mouth daily as needed for mild constipation. (Patient taking differently: Take 100 mg by mouth daily. ) 10 capsule 0  . dronabinol (MARINOL) 2.5 MG capsule Take 1 capsule (2.5 mg total) by mouth 2 (two) times daily before lunch and supper. 60 capsule 5  . erythromycin (ERYTHROCIN) 250 MG tablet Take 250 mg by mouth 3 (three) times daily.    [name] gabapentin (NEURONTIN) 300 MG capsule Take 1 capsule (300 mg total) by mouth 2 (two) times daily. 60 capsule 2  . insulin glargine (LANTUS) 100 UNIT/ML injection Inject 0.1 mLs (10 Units total) into the skin daily. 10 mL 5  . LORazepam (ATIVAN) 0.5 MG tablet Take 1 tablet (0.5 mg total) by mouth every 12 (twelve) hours as needed (nausea). 60 tablet 2  . metoCLOPramide (REGLAN) 10 MG tablet Take 1 tablet (10 mg total) by mouth 3 (three) times daily. (Patient taking differently: Take 10 mg by mouth 3 (three) times daily with meals. ) 90 tablet 6  . midodrine (PROAMATINE) 5 MG tablet Take 1 tablet (5 mg total) by mouth daily. Take 1 tablet before dialysis treatment (Patient taking differently: Take 5 mg by mouth every Monday, Wednesday, and Friday. Take 1 tablet before dialysis treatment) 30 tablet 6  . multivitamin (RENA-VIT) TABS tablet Take 1 tablet by mouth daily.    [name] NOVOLOG 100 UNIT/ML injection INJECT 7 UNITS INTO THE SKIN DAILY WITH SUPPER. (Patient taking differently: Inject 7 Units into the skin daily  with supper. ) 10 mL 3  . oxyCODONE (ROXICODONE) 5 MG immediate release tablet Take 1 tablet (5 mg total) by mouth 2 (two) times daily as needed for severe pain. Take 1-2 tablets every 4-6 hours for severe pain. Valatie  tablet 0  . pantoprazole (PROTONIX) 40 MG tablet Take 1 tablet (40 mg total) by mouth 2 (two) times daily before lunch and supper. 60 tablet 11  . promethazine (PHENERGAN) 12.5 MG tablet Take 1 tablet (12.5 mg total) by mouth every 6 (six) hours as needed for nausea or vomiting. 30 tablet 3  . sevelamer carbonate (RENVELA) 0.8 g PACK packet Take 0.8 g by mouth 3 (three) times daily with meals. 270 each 1  . warfarin (COUMADIN) 5 MG tablet Hold warfarin tonight and tomorrow (06/16/2019 and 06/17/2019). Check INR on 06/18/2019 and start medications based on readings  (previously on 7.52m on MWF and 583mall other days). (Patient not taking: Reported on 07/18/2019) 50 tablet 6   No current facility-administered medications for this encounter.     Metformin and related   Review of systems complete and found to be negative unless listed in HPI.    BP (!) 109/91 Comment: MAP 97  Pulse 98   Ht 5' 8"  (1.727 m)   SpO2 98%   BMI 27.49 kg/m  MAP 97  VAD interrogation & Equipment Management: See nurse's note above.   General: Well appearing this am. NAD.  HEENT: Normal. Neck: Supple, JVP 7-8 cm. Carotids OK.  Cardiac:  Mechanical heart sounds with LVAD hum present.  Lungs:  CTAB, normal effort.  Abdomen:  NT, ND, no HSM. No bruits or masses. +BS  LVAD exit site: Well-healed and incorporated. Dressing dry and intact. No erythema or drainage. Stabilization device present and accurately applied. Driveline dressing changed daily per sterile technique. Extremities:  S/p R AKA, left foot gangrenous.   Neuro:  Alert & oriented x 3. Cranial nerves grossly intact. Moves all 4 extremities w/o difficulty. Affect pleasant    ASSESSMENT AND PLAN: 1. PAD/ischemic feet: S/p R transmetatarsal  amputation in 8/20 then right AKA in 9/20.  Now with left foot gangrene.  S/p left leg angiogram with SFA PCI in 8/20. There was 1 vessel runoff via left peroneal with severe multilevel stenosis => unable to intervene on peroneal.  - He will need left AKA.  Scheduled for next Tuesday.  Will hold coumadin (INR 3.8 today) and will admit Monday after HD.  He will get a central line in IR on Monday (unable to get peripheral IVs).  2. Chronic systolic CHF: Ischemic cardiomyopathy. St Jude ICD. NSTEMI in March 2018 with DES to LAD and RCA, complicated by cardiogenic shock, low output requiring milrinone. Echo 8/18 with EF 20-25%, moderate MR. CPX (8/18) with severe functional impairment due to HF. He is s/p HM3 LVAD placement. He had pump thrombosis 6/19 [facility] to [facility] graft k[facility]. He had pump exchange for another HM3.   - Volume managed at dialysis, MWF.   - Continue ASA 81and warfarin with INR goal 2-2.5 (hold warfarin for now pre-surgery, INR 3.8 today).    - [facility] Dr DeMosetta Pigeonbut he is not candidate for transplantnowgiven extensive PAD. 3. ESRD: HD on M-W-F with midodrine pre-HD.  4.CAD: NSTEMI in 3/18. LHC with 99% ulcerated lesion proximal RCA with left to right collaterals, 95% mid LAD stenosis after mid LAD stent. s/p PCI to RCA and LAD on 11/25/16.No chest pain.  - Continue statin and ASA. 5. h/o DVTs: Factor V Leiden heterozygote. -Anticoagulated. 6. Diabetic gastroparesis: [facility]. However, electrogastrogram showed poor gastric accomodation/capacity. Therefore, small  frequent meals recommended. - Continue reglan 10 mg tid/ac and Marinol.  - Continue Protonix bid.  - He will continue erythromycinindefinitely.  - Limit narcotic use as much as possible.  7. PQZ:[id number] off hydralazine, may need to  restart.    Damichael Hofman 07/18/2019

## 2019-07-18 NOTE — Addendum Note (Signed)
Encounter addended by: Larey Dresser, MD on: 07/18/2019 9:47 PM  Actions taken: Clinical Note Signed, Charge Capture section accepted, Level of Service modified, Visit diagnoses modified

## 2019-07-18 NOTE — Telephone Encounter (Signed)
PT phoned patient after chart review performed. PT cancelled today's PT evaluation due to condition of right lower extremity and he is scheduled for surgery on 07/23/2019 for above knee amputation. Patient verbalized understanding of reason for cancellation of PT evaluation at this time.  Jamey Reas, PT, DPT Physical Therapist Specializing in Prosthetic Rehab [facility]. [address] [place], [place] [address] Phone [phone] FAX [phone]

## 2019-07-18 NOTE — Addendum Note (Signed)
Encounter addended by: Lezlie Octave, RN on: 07/18/2019 2:07 PM  Actions taken: Vitals modified, Order Reconciliation Section accessed, Home Medications modified, Medication List reviewed, Medication taking status modified, Clinical Note Signed

## 2019-07-18 NOTE — Progress Notes (Signed)
LVAD INR 

## 2019-07-19 ENCOUNTER — Other Ambulatory Visit (HOSPITAL_COMMUNITY)
Admission: RE | Admit: 2019-07-19 | Discharge: 2019-07-19 | Disposition: A | Discharge status: Home / Self Care | Payer: BC Managed Care – PPO | Source: Ambulatory Visit | Attending: Vascular Surgery | Admitting: Vascular Surgery

## 2019-07-19 DIAGNOSIS — Z01812 Encounter for preprocedural laboratory examination: Secondary | ICD-10-CM | POA: Diagnosis present

## 2019-07-19 DIAGNOSIS — Z20828 Contact with and (suspected) exposure to other viral communicable diseases: Secondary | ICD-10-CM | POA: Diagnosis not present

## 2019-07-21 LAB — NOVEL CORONAVIRUS, NAA (HOSP ORDER, SEND-OUT TO REF LAB; TAT 18-24 HRS): SARS-CoV-2, NAA: NOT DETECTED

## 2019-07-22 ENCOUNTER — Other Ambulatory Visit (HOSPITAL_COMMUNITY): Payer: Self-pay | Admitting: Cardiology

## 2019-07-22 ENCOUNTER — Inpatient Hospital Stay (HOSPITAL_COMMUNITY): Payer: BC Managed Care – PPO

## 2019-07-22 ENCOUNTER — Encounter (HOSPITAL_COMMUNITY): Payer: Self-pay | Admitting: Interventional Radiology

## 2019-07-22 ENCOUNTER — Other Ambulatory Visit: Payer: Self-pay

## 2019-07-22 ENCOUNTER — Inpatient Hospital Stay (HOSPITAL_COMMUNITY)
Admission: RE | Admit: 2019-07-22 | Payer: BC Managed Care – PPO | Source: Home / Self Care | Admitting: Vascular Surgery

## 2019-07-22 ENCOUNTER — Other Ambulatory Visit: Payer: Self-pay | Admitting: *Deleted

## 2019-07-22 ENCOUNTER — Inpatient Hospital Stay: Payer: BC Managed Care – PPO | Admitting: Student in an Organized Health Care Education/Training Program

## 2019-07-22 ENCOUNTER — Inpatient Hospital Stay (HOSPITAL_COMMUNITY)
Admission: RE | Admit: 2019-07-22 | Discharge: 2019-07-30 | DRG: 239 | Disposition: A | Discharge status: Home Health | Payer: BC Managed Care – PPO | Source: Ambulatory Visit | Attending: Cardiology | Admitting: Cardiology

## 2019-07-22 ENCOUNTER — Inpatient Hospital Stay (HOSPITAL_COMMUNITY)
Admission: RE | Admit: 2019-07-22 | Discharge: 2019-07-22 | Disposition: A | Discharge status: Home / Self Care | Payer: BC Managed Care – PPO | Source: Ambulatory Visit | Attending: Cardiology | Admitting: Cardiology

## 2019-07-22 DIAGNOSIS — B999 Unspecified infectious disease: Secondary | ICD-10-CM

## 2019-07-22 DIAGNOSIS — D62 Acute posthemorrhagic anemia: Secondary | ICD-10-CM | POA: Diagnosis not present

## 2019-07-22 DIAGNOSIS — Z23 Encounter for immunization: Secondary | ICD-10-CM

## 2019-07-22 DIAGNOSIS — N2581 Secondary hyperparathyroidism of renal origin: Secondary | ICD-10-CM | POA: Diagnosis present

## 2019-07-22 DIAGNOSIS — I480 Paroxysmal atrial fibrillation: Secondary | ICD-10-CM | POA: Diagnosis present

## 2019-07-22 DIAGNOSIS — Z7901 Long term (current) use of anticoagulants: Secondary | ICD-10-CM

## 2019-07-22 DIAGNOSIS — E1143 Type 2 diabetes mellitus with diabetic autonomic (poly)neuropathy: Secondary | ICD-10-CM | POA: Diagnosis not present

## 2019-07-22 DIAGNOSIS — I5022 Chronic systolic (congestive) heart failure: Secondary | ICD-10-CM | POA: Diagnosis present

## 2019-07-22 DIAGNOSIS — Z89611 Acquired absence of right leg above knee: Secondary | ICD-10-CM | POA: Diagnosis not present

## 2019-07-22 DIAGNOSIS — Z7189 Other specified counseling: Secondary | ICD-10-CM | POA: Diagnosis not present

## 2019-07-22 DIAGNOSIS — E785 Hyperlipidemia, unspecified: Secondary | ICD-10-CM | POA: Diagnosis present

## 2019-07-22 DIAGNOSIS — I255 Ischemic cardiomyopathy: Secondary | ICD-10-CM | POA: Diagnosis present

## 2019-07-22 DIAGNOSIS — I2510 Atherosclerotic heart disease of native coronary artery without angina pectoris: Secondary | ICD-10-CM | POA: Diagnosis present

## 2019-07-22 DIAGNOSIS — Z833 Family history of diabetes mellitus: Secondary | ICD-10-CM

## 2019-07-22 DIAGNOSIS — Z01818 Encounter for other preprocedural examination: Secondary | ICD-10-CM

## 2019-07-22 DIAGNOSIS — E8889 Other specified metabolic disorders: Secondary | ICD-10-CM | POA: Diagnosis present

## 2019-07-22 DIAGNOSIS — Z79899 Other long term (current) drug therapy: Secondary | ICD-10-CM

## 2019-07-22 DIAGNOSIS — N186 End stage renal disease: Secondary | ICD-10-CM | POA: Diagnosis not present

## 2019-07-22 DIAGNOSIS — D6851 Activated protein C resistance: Secondary | ICD-10-CM | POA: Diagnosis not present

## 2019-07-22 DIAGNOSIS — Z7982 Long term (current) use of aspirin: Secondary | ICD-10-CM

## 2019-07-22 DIAGNOSIS — E1152 Type 2 diabetes mellitus with diabetic peripheral angiopathy with gangrene: Principal | ICD-10-CM | POA: Diagnosis present

## 2019-07-22 DIAGNOSIS — I132 Hypertensive heart and chronic kidney disease with heart failure and with stage 5 chronic kidney disease, or end stage renal disease: Secondary | ICD-10-CM | POA: Diagnosis present

## 2019-07-22 DIAGNOSIS — D631 Anemia in chronic kidney disease: Secondary | ICD-10-CM | POA: Diagnosis present

## 2019-07-22 DIAGNOSIS — Z794 Long term (current) use of insulin: Secondary | ICD-10-CM

## 2019-07-22 DIAGNOSIS — E1122 Type 2 diabetes mellitus with diabetic chronic kidney disease: Secondary | ICD-10-CM | POA: Diagnosis present

## 2019-07-22 DIAGNOSIS — K3184 Gastroparesis: Secondary | ICD-10-CM | POA: Diagnosis present

## 2019-07-22 DIAGNOSIS — Z95811 Presence of heart assist device: Secondary | ICD-10-CM

## 2019-07-22 DIAGNOSIS — Z515 Encounter for palliative care: Secondary | ICD-10-CM | POA: Diagnosis not present

## 2019-07-22 DIAGNOSIS — Z992 Dependence on renal dialysis: Secondary | ICD-10-CM | POA: Diagnosis not present

## 2019-07-22 DIAGNOSIS — Z955 Presence of coronary angioplasty implant and graft: Secondary | ICD-10-CM | POA: Diagnosis not present

## 2019-07-22 DIAGNOSIS — I252 Old myocardial infarction: Secondary | ICD-10-CM | POA: Diagnosis not present

## 2019-07-22 DIAGNOSIS — I998 Other disorder of circulatory system: Secondary | ICD-10-CM | POA: Diagnosis present

## 2019-07-22 DIAGNOSIS — Z9049 Acquired absence of other specified parts of digestive tract: Secondary | ICD-10-CM

## 2019-07-22 DIAGNOSIS — I739 Peripheral vascular disease, unspecified: Secondary | ICD-10-CM | POA: Diagnosis present

## 2019-07-22 DIAGNOSIS — E7800 Pure hypercholesterolemia, unspecified: Secondary | ICD-10-CM | POA: Diagnosis present

## 2019-07-22 DIAGNOSIS — E871 Hypo-osmolality and hyponatremia: Secondary | ICD-10-CM | POA: Diagnosis not present

## 2019-07-22 DIAGNOSIS — Z95810 Presence of automatic (implantable) cardiac defibrillator: Secondary | ICD-10-CM

## 2019-07-22 DIAGNOSIS — I96 Gangrene, not elsewhere classified: Secondary | ICD-10-CM | POA: Diagnosis not present

## 2019-07-22 HISTORY — PX: IR US GUIDE VASC ACCESS RIGHT: IMG2390

## 2019-07-22 HISTORY — PX: IR FLUORO GUIDE CV LINE RIGHT: IMG2283

## 2019-07-22 LAB — CBC
HCT: 32.3 % — ABNORMAL LOW (ref 39.0–52.0)
Hemoglobin: 9.8 g/dL — ABNORMAL LOW (ref 13.0–17.0)
MCH: 25.7 pg — ABNORMAL LOW (ref 26.0–34.0)
MCHC: 30.3 g/dL (ref 30.0–36.0)
MCV: 84.8 fL (ref 80.0–100.0)
Platelets: 194 10*3/uL (ref 150–400)
RBC: 3.81 MIL/uL — ABNORMAL LOW (ref 4.22–5.81)
RDW: 17.3 % — ABNORMAL HIGH (ref 11.5–15.5)
WBC: 12.9 10*3/uL — ABNORMAL HIGH (ref 4.0–10.5)
nRBC: 0.2 % (ref 0.0–0.2)

## 2019-07-22 LAB — PROTIME-INR
INR: 1.2 (ref 0.8–1.2)
Prothrombin Time: 14.8 seconds (ref 11.4–15.2)

## 2019-07-22 LAB — COMPREHENSIVE METABOLIC PANEL
ALT: 14 U/L (ref 0–44)
AST: 18 U/L (ref 15–41)
Albumin: 2.7 g/dL — ABNORMAL LOW (ref 3.5–5.0)
Alkaline Phosphatase: 112 U/L (ref 38–126)
Anion gap: 12 (ref 5–15)
BUN: 14 mg/dL (ref 6–20)
CO2: 29 mmol/L (ref 22–32)
Calcium: 8.4 mg/dL — ABNORMAL LOW (ref 8.9–10.3)
Chloride: 92 mmol/L — ABNORMAL LOW (ref 98–111)
Creatinine, Ser: 5.12 mg/dL — ABNORMAL HIGH (ref 0.61–1.24)
GFR calc Af Amer: 14 mL/min — ABNORMAL LOW (ref >60)
GFR calc non Af Amer: 12 mL/min — ABNORMAL LOW (ref >60)
Glucose, Bld: 120 mg/dL — ABNORMAL HIGH (ref 70–99)
Potassium: 3.4 mmol/L — ABNORMAL LOW (ref 3.5–5.1)
Sodium: 133 mmol/L — ABNORMAL LOW (ref 135–145)
Total Bilirubin: 0.6 mg/dL (ref 0.3–1.2)
Total Protein: 7.8 g/dL (ref 6.5–8.1)

## 2019-07-22 LAB — GLUCOSE, CAPILLARY
Glucose-Capillary: 124 mg/dL — ABNORMAL HIGH (ref 70–99)
Glucose-Capillary: 237 mg/dL — ABNORMAL HIGH (ref 70–99)

## 2019-07-22 LAB — SURGICAL PCR SCREEN
MRSA, PCR: NEGATIVE
Staphylococcus aureus: NEGATIVE

## 2019-07-22 LAB — LACTATE DEHYDROGENASE: LDH: 219 U/L — ABNORMAL HIGH (ref 98–192)

## 2019-07-22 MED ORDER — CHLORHEXIDINE GLUCONATE 4 % EX LIQD
60.0000 mL | Freq: Once | CUTANEOUS | Status: AC
  Administered 2019-07-22: 4 via TOPICAL
  Filled 2019-07-22: qty 60

## 2019-07-22 MED ORDER — INSULIN ASPART 100 UNIT/ML ~~LOC~~ SOLN: 7.0000 [IU] | Freq: Every day | SUBCUTANEOUS | Status: DC

## 2019-07-22 MED ORDER — FENTANYL CITRATE (PF) 100 MCG/2ML IJ SOLN
INTRAMUSCULAR | Status: AC
  Filled 2019-07-22: qty 2

## 2019-07-22 MED ORDER — GABAPENTIN 300 MG PO CAPS
300.0000 mg | ORAL_CAPSULE | Freq: Two times a day (BID) | ORAL | Status: DC
  Administered 2019-07-22 – 2019-07-30 (×16): 300 mg via ORAL
  Filled 2019-07-22 (×16): qty 1

## 2019-07-22 MED ORDER — CITALOPRAM HYDROBROMIDE 20 MG PO TABS
20.0000 mg | ORAL_TABLET | Freq: Every day | ORAL | Status: DC
  Administered 2019-07-23 – 2019-07-30 (×8): 20 mg via ORAL
  Filled 2019-07-22 (×10): qty 1

## 2019-07-22 MED ORDER — SODIUM CHLORIDE 0.9 % IV SOLN
INTRAVENOUS | Status: DC
  Administered 2019-07-23: 07:00:00 via INTRAVENOUS

## 2019-07-22 MED ORDER — LORAZEPAM 0.5 MG PO TABS: 0.5000 mg | ORAL_TABLET | Freq: Two times a day (BID) | ORAL | Status: DC | PRN

## 2019-07-22 MED ORDER — MIDODRINE HCL 5 MG PO TABS
5.0000 mg | ORAL_TABLET | ORAL | Status: DC
  Administered 2019-07-24 – 2019-07-30 (×4): 5 mg via ORAL
  Filled 2019-07-22 (×2): qty 1

## 2019-07-22 MED ORDER — ERYTHROMYCIN BASE 250 MG PO TABS
250.0000 mg | ORAL_TABLET | Freq: Three times a day (TID) | ORAL | Status: DC
  Administered 2019-07-22 – 2019-07-30 (×24): 250 mg via ORAL
  Filled 2019-07-22 (×24): qty 1

## 2019-07-22 MED ORDER — INSULIN ASPART 100 UNIT/ML ~~LOC~~ SOLN
0.0000 [IU] | Freq: Three times a day (TID) | SUBCUTANEOUS | Status: DC
  Administered 2019-07-22: 1 [IU] via SUBCUTANEOUS
  Administered 2019-07-23: 2 [IU] via SUBCUTANEOUS

## 2019-07-22 MED ORDER — ONDANSETRON HCL 4 MG/2ML IJ SOLN: 4.0000 mg | Freq: Four times a day (QID) | INTRAMUSCULAR | Status: DC | PRN

## 2019-07-22 MED ORDER — ACETAMINOPHEN 325 MG PO TABS: 650.0000 mg | ORAL_TABLET | ORAL | Status: DC | PRN

## 2019-07-22 MED ORDER — LIDOCAINE HCL 1 % IJ SOLN
INTRAMUSCULAR | Status: AC
  Filled 2019-07-22: qty 20

## 2019-07-22 MED ORDER — PNEUMOCOCCAL VAC POLYVALENT 25 MCG/0.5ML IJ INJ
0.5000 mL | INJECTION | INTRAMUSCULAR | Status: DC
  Filled 2019-07-22: qty 0.5

## 2019-07-22 MED ORDER — MUPIROCIN 2 % EX OINT
1.0000 "application " | TOPICAL_OINTMENT | Freq: Two times a day (BID) | CUTANEOUS | Status: DC
  Administered 2019-07-22: 1 via NASAL
  Filled 2019-07-22: qty 22

## 2019-07-22 MED ORDER — LORAZEPAM 2 MG/ML IJ SOLN
0.5000 mg | Freq: Two times a day (BID) | INTRAMUSCULAR | Status: DC | PRN
  Administered 2019-07-23 – 2019-07-26 (×3): 0.5 mg via INTRAVENOUS
  Filled 2019-07-22 (×2): qty 1

## 2019-07-22 MED ORDER — DOCUSATE SODIUM 100 MG PO CAPS: 100.0000 mg | ORAL_CAPSULE | Freq: Every day | ORAL | Status: DC | PRN

## 2019-07-22 MED ORDER — INFLUENZA VAC SPLIT QUAD 0.5 ML IM SUSY
0.5000 mL | PREFILLED_SYRINGE | INTRAMUSCULAR | Status: DC
  Filled 2019-07-22: qty 0.5

## 2019-07-22 MED ORDER — CALCITRIOL 0.25 MCG PO CAPS: 0.5000 ug | ORAL_CAPSULE | ORAL | Status: DC

## 2019-07-22 MED ORDER — PROMETHAZINE HCL 25 MG/ML IJ SOLN
12.5000 mg | Freq: Three times a day (TID) | INTRAMUSCULAR | Status: DC | PRN
  Administered 2019-07-25 – 2019-07-28 (×2): 12.5 mg via INTRAVENOUS
  Filled 2019-07-22 (×2): qty 1

## 2019-07-22 MED ORDER — ATORVASTATIN CALCIUM 40 MG PO TABS
40.0000 mg | ORAL_TABLET | Freq: Every day | ORAL | Status: DC
  Administered 2019-07-22 – 2019-07-29 (×8): 40 mg via ORAL
  Filled 2019-07-22 (×10): qty 1

## 2019-07-22 MED ORDER — SEVELAMER CARBONATE 0.8 G PO PACK
0.8000 g | PACK | Freq: Three times a day (TID) | ORAL | Status: DC
  Administered 2019-07-22 – 2019-07-30 (×19): 0.8 g via ORAL
  Filled 2019-07-22 (×25): qty 1

## 2019-07-22 MED ORDER — DRONABINOL 2.5 MG PO CAPS
2.5000 mg | ORAL_CAPSULE | Freq: Two times a day (BID) | ORAL | Status: DC
  Administered 2019-07-22 – 2019-07-30 (×15): 2.5 mg via ORAL
  Filled 2019-07-22 (×15): qty 1

## 2019-07-22 MED ORDER — CEFAZOLIN SODIUM-DEXTROSE 2-4 GM/100ML-% IV SOLN
2.0000 g | INTRAVENOUS | Status: AC
  Administered 2019-07-23: 2 g via INTRAVENOUS
  Filled 2019-07-22: qty 100

## 2019-07-22 MED ORDER — FENTANYL CITRATE (PF) 100 MCG/2ML IJ SOLN
25.0000 ug | INTRAMUSCULAR | Status: DC | PRN
  Administered 2019-07-22 – 2019-07-23 (×4): 25 ug via INTRAVENOUS
  Filled 2019-07-22 (×3): qty 2

## 2019-07-22 MED ORDER — PANTOPRAZOLE SODIUM 40 MG PO TBEC
40.0000 mg | DELAYED_RELEASE_TABLET | Freq: Two times a day (BID) | ORAL | Status: DC
  Administered 2019-07-22 – 2019-07-30 (×16): 40 mg via ORAL
  Filled 2019-07-22 (×16): qty 1

## 2019-07-22 MED ORDER — CHLORHEXIDINE GLUCONATE 4 % EX LIQD
60.0000 mL | Freq: Once | CUTANEOUS | Status: AC
  Administered 2019-07-23: 4 via TOPICAL
  Filled 2019-07-22: qty 60

## 2019-07-22 MED ORDER — LIDOCAINE HCL 1 % IJ SOLN
INTRAMUSCULAR | Status: DC | PRN
  Administered 2019-07-22: 5 mL

## 2019-07-22 MED ORDER — HYDRALAZINE HCL 25 MG PO TABS
25.0000 mg | ORAL_TABLET | ORAL | Status: DC
  Administered 2019-07-23 (×2): 25 mg via ORAL
  Filled 2019-07-22 (×2): qty 1

## 2019-07-22 MED ORDER — HEPARIN (PORCINE) 25000 UT/250ML-% IV SOLN
1100.0000 [IU]/h | INTRAVENOUS | Status: DC
  Administered 2019-07-22: 900 [IU]/h via INTRAVENOUS
  Filled 2019-07-22: qty 250

## 2019-07-22 MED ORDER — INSULIN GLARGINE 100 UNIT/ML ~~LOC~~ SOLN
10.0000 [IU] | Freq: Every day | SUBCUTANEOUS | Status: DC
  Administered 2019-07-22 – 2019-07-24 (×3): 10 [IU] via SUBCUTANEOUS
  Filled 2019-07-22 (×4): qty 0.1

## 2019-07-22 MED ORDER — METOCLOPRAMIDE HCL 10 MG PO TABS
10.0000 mg | ORAL_TABLET | Freq: Three times a day (TID) | ORAL | Status: DC
  Administered 2019-07-22 – 2019-07-30 (×20): 10 mg via ORAL
  Filled 2019-07-22 (×20): qty 1

## 2019-07-22 MED ORDER — PROMETHAZINE HCL 25 MG PO TABS: 12.5000 mg | ORAL_TABLET | Freq: Four times a day (QID) | ORAL | Status: DC | PRN

## 2019-07-22 NOTE — H&P (Addendum)
Advanced Heart Failure Team History and Physical Note   PCP:  Richarda Osmond, DO  PCP-Cardiology: Dr. Aundra Dubin   Reason for Admission:  Ischemic Left Leg    HPI:    Mr Gapinski is a 53 year old withCAD s/p anterior MI in 2010 and NSTEMI in 3/18 with DES to pRCA and mLAD, gastroparesis,ischemic cardiomyopathy now s/p Heartmate 3 LVAD and ESRD, factor V leiden, PAF, chronic a/c w/ coumadin and severe PAD.  Admitted 7/10 with ischemic right foot. VVS consulted and on 7/13 he underwent peripheral angiogram that showed 80% stenosis right SFV and occluded right PT and had stent to right SFA and angioplasty to PT. He then had 4th toe amputation. He was discharged to home on 03/22/19.  Admitted7/22/20withrecurrent n/v, leukocytosis and ischemic changes in right foot. Admitted to  ICU for treatment of severe gastroparesisand possible ischemic foot. VVS , Nephrology, ID consulted.He was treated with IV vancomycin for possible osteomyelitis/infection, but thought that more likely ischemia could explain all the findings. He was discharged home to followup with vascular surgery.  He presented back to [facility] on 05/28/19 for scheduled admit due to ischemia of the left great toe w/ plans for left great toe amputation. On 9/23, he underwent amputation of the left great toe by Dr. Trula Slade. On 9/27, he had Rt BKA, done by Dr. Donnetta Hutching. He had post op anemia, requiring transfusion x 2. He got 1 unit PRBCs with HD on 9/30 and again on 10/2.  He also developed leukocytosis postop. Antibiotics expanded to vancomycin/meropenem. Completed course on 10/2. Blood cultures negative. He was discharged on 10/11 to [facility] for rehab. He was discharged from [facility] on 11/6.   He presents back to [facility] for planned left AKA scheduled for 07/23/19 w/ Dr. Oneida Alar. He has been holding coumadin for anticipated surgery. AHF team admitting for LVAD management during the perioperative period.    He HD session today.  Has not taken any meds yet. BP 114/100. Complains of severe LE pain. No other complaints.   LVAD interrogation HM3: Flow 4.3, speed 5500, power 4.0 PI 4.6.  8 PI events today.       Review of Systems: [y] = yes, [ ]  = no   General: Weight gain [ ] ; Weight loss [ ] ; Anorexia [ ] ; Fatigue [ ] ; Fever [ ] ; Chills [ ] ; Weakness [ ]   Cardiac: Chest pain/pressure [ ] ; Resting SOB [ ] ; Exertional SOB [ ] ; Orthopnea [ ] ; Pedal Edema [ ] ; Palpitations [ ] ; Syncope [ ] ; Presyncope [ ] ; Paroxysmal nocturnal dyspnea[ ]   Pulmonary: Cough [ ] ; Wheezing[ ] ; Hemoptysis[ ] ; Sputum [ ] ; Snoring [ ]   GI: Vomiting[ ] ; Dysphagia[ ] ; Melena[ ] ; Hematochezia [ ] ; Heartburn[ ] ; Abdominal pain [ ] ; Constipation [ ] ; Diarrhea [ ] ; BRBPR [ ]   GU: Hematuria[ ] ; Dysuria [ ] ; Nocturia[ ]   Vascular: Pain in legs with walking [ ] ; Pain in feet with lying flat [ ] ; Non-healing sores [ ] ; Stroke [ ] ; TIA [ ] ; Slurred speech [ ] ;  Neuro: Headaches[ ] ; Vertigo[ ] ; Seizures[ ] ; Paresthesias[ ] ;Blurred vision [ ] ; Diplopia [ ] ; Vision changes [ ]   Ortho/Skin: Arthritis [ ] ; Joint pain [ ] ; Muscle pain [ ] ; Joint swelling [ ] ; Back Pain [ ] ; Rash [ ]   Psych: Depression[ ] ; Anxiety[ ]   Heme: Bleeding problems [ ] ; Clotting disorders [ ] ; Anemia [ ]   Endocrine: Diabetes [ ] ; Thyroid dysfunction[ ]    Home Medications Prior to  Admission medications   Medication Sig Start Date End Date Taking? Authorizing Provider  acetaminophen (TYLENOL) 325 MG tablet Take 2 tablets (650 mg total) by mouth every 4 (four) hours as needed for headache or mild pain. 03/22/19   Kroeger, Lorelee Cover., PA-C  aspirin EC 81 MG tablet Take 1 tablet (81 mg total) by mouth daily. 08/20/18   Eileen Stanford, PA-C  atorvastatin (LIPITOR) 40 MG tablet Take 1 tablet (40 mg total) by mouth daily at 6 PM. 01/25/18   [name], MD  calcitRIOL (ROCALTROL) 0.5 MCG capsule Take 1 capsule (0.5 mcg total) by mouth every Monday, Wednesday, and Friday. 06/06/18    Georgiana Shore, NP  citalopram (CELEXA) 20 MG tablet Take 1 tablet (20 mg total) by mouth daily. 03/19/18   Georgiana Shore, NP  docusate sodium (COLACE) 100 MG capsule Take 1 capsule (100 mg total) by mouth daily as needed for mild constipation. Patient taking differently: Take 100 mg by mouth daily.  03/22/19   Kroeger, Lorelee Cover., PA-C  dronabinol (MARINOL) 2.5 MG capsule Take 1 capsule (2.5 mg total) by mouth 2 (two) times daily before lunch and supper. 03/27/18   Larey Dresser, MD  erythromycin (ERYTHROCIN) 250 MG tablet Take 250 mg by mouth 3 (three) times daily.    [provider]  gabapentin (NEURONTIN) 300 MG capsule Take 1 capsule (300 mg total) by mouth 2 (two) times daily. 04/16/19   Larey Dresser, MD  insulin glargine (LANTUS) 100 UNIT/ML injection Inject 0.1 mLs (10 Units total) into the skin daily. Patient taking differently: Inject 10 Units into the skin at bedtime.  05/23/19   Anderson, Chelsey L, DO  LORazepam (ATIVAN) 0.5 MG tablet Take 1 tablet (0.5 mg total) by mouth every 12 (twelve) hours as needed (nausea). 05/17/19   Larey Dresser, MD  metoCLOPramide (REGLAN) 10 MG tablet Take 1 tablet (10 mg total) by mouth 3 (three) times daily. Patient taking differently: Take 10 mg by mouth 3 (three) times daily with meals.  01/22/18   Georgiana Shore, NP  midodrine (PROAMATINE) 5 MG tablet Take 1 tablet (5 mg total) by mouth daily. Take 1 tablet before dialysis treatment Patient taking differently: Take 5 mg by mouth every Monday, Wednesday, and Friday. Take 1 tablet before dialysis treatment 04/04/19   Clegg, Amy D, NP  multivitamin (RENA-VIT) TABS tablet Take 1 tablet by mouth daily.    [provider]  NOVOLOG 100 UNIT/ML injection INJECT 7 UNITS INTO THE SKIN DAILY WITH SUPPER. Patient taking differently: Inject 7 Units into the skin daily with supper.  03/15/19   Anderson, Chelsey L, DO  oxyCODONE (ROXICODONE) 5 MG immediate release tablet Take 1 tablet (5 mg  total) by mouth 2 (two) times daily as needed for severe pain. Take 1-2 tablets every 4-6 hours for severe pain. 05/21/19   Larey Dresser, MD  pantoprazole (PROTONIX) 40 MG tablet Take 1 tablet (40 mg total) by mouth 2 (two) times daily before lunch and supper. 05/07/19   Barrett, Evelene Croon, PA-C  promethazine (PHENERGAN) 12.5 MG tablet Take 1 tablet (12.5 mg total) by mouth every 6 (six) hours as needed for nausea or vomiting. 10/23/18   Larey Dresser, MD  sevelamer carbonate (RENVELA) 0.8 g PACK packet Take 0.8 g by mouth 3 (three) times daily with meals. 06/16/19   Leanor Kail, PA  warfarin (COUMADIN) 5 MG tablet Hold warfarin tonight and tomorrow (06/16/2019 and 06/17/2019). Check INR on  06/18/2019 and start medications based on readings  (previously on 7.20m on MWF and 566mall other days). 06/16/19   BhLeanor KailPA    Past Medical History: Past Medical History:  Diagnosis Date   Angina    ASCVD (arteriosclerotic cardiovascular disease)    , Anterior infarction 2005, LAD diagonal bifurcation intervention 03/2004   Automatic implantable cardiac defibrillator -St. Jude's        Benign neoplasm of colon    CHF (congestive heart failure) (HCC)    Chronic systolic heart failure (HCC)    Coronary artery disease     Widely patent previously placed stents in the left anterior    Crohn's disease ([facility]   Deep venous thrombosis (HCC)    Recurrent-on Coumadin   Dialysis patient ([facility]   Dyspnea    Gastroparesis    GERD (gastroesophageal reflux disease)    High cholesterol    Hyperlipidemia    Hypersomnolent    Previous diagnosis of narcolepsy   Hypertension, essential    Ischemic cardiomyopathy    Ejection fraction 15-20% catheterization 2010   Type II or unspecified type diabetes mellitus without mention of complication, not stated as uncontrolled    Unspecified gastritis and gastroduodenitis without mention of hemorrhage     Past Surgical  History: Past Surgical History:  Procedure Laterality Date   A/V SHUNTOGRAM N/A 05/24/2018   Procedure: A/V SHUNTOGRAM;  Surgeon: ClMarty HeckMD;  [place];  Service: Cardiovascular;  Laterality: N/A;   ABDOMINAL AORTOGRAM W/LOWER EXTREMITY N/A 03/18/2019   Procedure: ABDOMINAL AORTOGRAM W/LOWER EXTREMITY;  Surgeon: CaWaynetta SandyMD;  [facility];  Service: Cardiovascular;  Laterality: N/A;   AMPUTATION Right 03/21/2019   Procedure: AMPUTATION DIGIT RIGHT FOURTH TOE;  Surgeon: CaWaynetta SandyMD;  [facility] Service: Vascular;  Laterality: Right;   AMPUTATION Left 05/29/2019   Procedure: AMPUTATION LEFT GREAT TOE;  Surgeon: BrSerafina MitchellMD;  [place] Service: Vascular;  Laterality: Left;   AMPUTATION Right 06/02/2019   Procedure: ABOVE KNEE AMPUTATION;  Surgeon: EaRosetta PosnerMD;  Location: [facility] Service: Vascular;  Laterality: Right;   APPENDECTOMY     AV FISTULA PLACEMENT Right 06/26/2017   Procedure: ARTERIOVENOUS (AV) FISTULA CREATION VERSUS GRAFT RIGHT  ARM;  Surgeon: ChConrad BurlingtonMD;  [place] Service: Vascular;  Laterality: Right;   AV FISTULA PLACEMENT Right 10/31/2017   Procedure: INSERTION OF ARTERIOVENOUS (AV) ARTEGRAFT,  RIGHT UPPER ARM;  Surgeon: ChConrad BurlingtonMD;  [place] [facility] Service: Vascular;  Laterality: Right;   AV FISTULA PLACEMENT Left 05/29/2018   Procedure: ARTERIOVENOUS (AV) FISTULA CREATION  LEFT UPPER EXTREMITY;  Surgeon: CaWaynetta SandyMD;  [place] Service: Vascular;  Laterality: Left;   AV FISTULA PLACEMENT Left 08/09/2018   Procedure: INSERTION OF ARTERIOVENOUS (AV) GORE-TEX GRAFT LEFT UPPER ARM;  Surgeon: CaWaynetta SandyMD;  [place] [facility] Service: Vascular;  Laterality: Left;   CARDIAC DEFIBRILLATOR PLACEMENT  2010   St. Jude ICD   CENTRAL LINE INSERTION Right 05/24/2018   Procedure: CENTRAL LINE INSERTION;   Surgeon: ClMarty HeckMD;  [facility];  Service: Cardiovascular;  Laterality: Right;   COLECTOMY  ~ 2003   "for Crohn's" ascending colon.     CORONARY STENT INTERVENTION N/A 11/25/2016   Procedure: Coronary Stent Intervention;  Surgeon: DaLeonie ManMD;  [facility];  Service: Cardiovascular;  Laterality: N/A;   EP IMPLANTABLE DEVICE N/A 09/14/2016   Procedure: ICD Generator Changeout;  Surgeon: Deboraha Sprang, MD;  Location: [place] CV LAB;  Service: Cardiovascular;  Laterality: N/A;   ESOPHAGOGASTRODUODENOSCOPY N/A 07/12/2017   Procedure: ESOPHAGOGASTRODUODENOSCOPY (EGD);  Surgeon: Jerene Bears, MD;  Location: [facility] ENDOSCOPY;  Service: Gastroenterology;  Laterality: N/A;  VAD pt so VAD team will need to accompany pt.    FETAL SURGERY FOR CONGENITAL HERNIA     ????? pt has no knowledge of this.[name]     FISTULOGRAM Left 08/09/2018   Procedure: FISTULOGRAM LEFT ARM;  Surgeon: Waynetta Sandy, MD;  Location: [place];  Service: Vascular;  Laterality: Left;   INGUINAL HERNIA REPAIR     INSERTION OF DIALYSIS CATHETER Right 06/26/2017   Procedure: INSERTION OF TUNNELED  DIALYSIS CATHETER RIGHT INTERNAL JUGULAR;  Surgeon: Conrad [place], MD;  Location: [place];  Service: Vascular;  Laterality: Right;   INSERTION OF IMPLANTABLE LEFT VENTRICULAR ASSIST DEVICE N/A 06/12/2017   Procedure: INSERTION OF IMPLANTABLE LEFT VENTRICULAR ASSIST [place] 3;  Surgeon: Ivin Poot, MD;  Location: [place];  Service: Open Heart Surgery;  Laterality: N/A;  HM3 LVAD  CIRC ARREST  NITRIC OXIDE   INSERTION OF IMPLANTABLE LEFT VENTRICULAR ASSIST DEVICE N/A 02/28/2018   Procedure: REDO INSERTION OF IMPLANTABLE LEFT VENTRICULAR ASSIST DEVICE - HEARTMATE 3;  Surgeon: Ivin Poot, MD;  Location: [place];  Service: Open Heart Surgery;  Laterality: N/A;   IR FLUORO GUIDE CV LINE RIGHT  07/22/2019   IR REMOVAL TUN CV CATH W/O FL  02/26/2018   IR  THORACENTESIS ASP PLEURAL SPACE W/IMG GUIDE  06/28/2017   IR US GUIDE VASC ACCESS RIGHT  07/22/2019   LIGATION OF ARTERIOVENOUS  FISTULA Right 10/31/2017   Procedure: LIGATION OF BRACHIO-BASILIC VEIN TRANSPOSITION RIGHT ARM;  Surgeon: Conrad [place], MD;  Location: [place];  Service: Vascular;  Laterality: Right;   LOWER EXTREMITY ANGIOGRAPHY  05/02/2019   Procedure: Lower Extremity Angiography;  Surgeon: Marty Heck, MD;  Location: [place] CV LAB;  Service: Cardiovascular;;   MULTIPLE EXTRACTIONS WITH ALVEOLOPLASTY N/A 06/08/2017   Procedure: MULTIPLE EXTRACTION WITH ALVEOLOPLASTY AND PRE PROSTHETIC SURGERY AS NEEDED;  Surgeon: Lenn Cal, DDS;  Location: [place];  Service: Oral Surgery;  Laterality: N/A;   PERIPHERAL VASCULAR BALLOON ANGIOPLASTY  03/18/2019   Procedure: PERIPHERAL VASCULAR BALLOON ANGIOPLASTY;  Surgeon: Waynetta Sandy, MD;  Location: [place] CV LAB;  Service: Cardiovascular;;  RT PT   PERIPHERAL VASCULAR INTERVENTION  03/18/2019   Procedure: PERIPHERAL VASCULAR INTERVENTION;  Surgeon: Waynetta Sandy, MD;  Location: [place] CV LAB;  Service: Cardiovascular;;  RT SFA   PERIPHERAL VASCULAR INTERVENTION  05/02/2019   Procedure: PERIPHERAL VASCULAR INTERVENTION;  Surgeon: Marty Heck, MD;  Location: [place] CV LAB;  Service: Cardiovascular;;  Lt. SFA   RIGHT HEART CATH N/A 06/07/2017   Procedure: RIGHT HEART CATH;  Surgeon: Larey Dresser, MD;  Location: [place] CV LAB;  Service: Cardiovascular;  Laterality: N/A;   RIGHT HEART CATH N/A 02/08/2018   Procedure: RIGHT HEART CATH;  Surgeon: Larey Dresser, MD;  Location: [place] CV LAB;  Service: Cardiovascular;  Laterality: N/A;   RIGHT HEART CATH N/A 08/09/2018   Procedure: RIGHT HEART CATH;  Surgeon: Larey Dresser, MD;  Location: [place] CV LAB;  Service: Cardiovascular;  Laterality: N/A;   RIGHT/LEFT HEART CATH AND CORONARY ANGIOGRAPHY  N/A 11/23/2016    Procedure: Right/Left Heart Cath and Coronary Angiography;  Surgeon: Larey Dresser, MD;  Location: [place] CV LAB;  Service: Cardiovascular;  Laterality: N/A;   TEE WITHOUT CARDIOVERSION N/A 06/12/2017   Procedure: TRANSESOPHAGEAL ECHOCARDIOGRAM (TEE);  Surgeon: Prescott Gum, Collier Salina, MD;  Location: [place];  Service: Open Heart Surgery;  Laterality: N/A;   TEE WITHOUT CARDIOVERSION N/A 02/28/2018   Procedure: TRANSESOPHAGEAL ECHOCARDIOGRAM (TEE);  Surgeon: Prescott Gum, Collier Salina, MD;  Location: [place];  Service: Open Heart Surgery;  Laterality: N/A;   TEMPORARY DIALYSIS CATHETER Left 05/24/2018   Procedure: TEMPORARY DIALYSIS CATHETER;  Surgeon: Marty Heck, MD;  Location: [place] CV LAB;  Service: Cardiovascular;  Laterality: Left;   THROMBECTOMY AND REVISION OF ARTERIOVENTOUS (AV) GORETEX  GRAFT Right 12/12/2017   Procedure: THROMBECTOMY ARTERIOVENTOUS (AV) GORETEX  GRAFT RIGHT UPPER ARM;  Surgeon: Conrad [place], MD;  Location: [place];  Service: Vascular;  Laterality: Right;   TRANSMETATARSAL AMPUTATION Right 04/30/2019   Procedure: RIGHT TRANSMETATARSAL AMPUTATION;  Surgeon: Waynetta Sandy, MD;  Location: [place];  Service: Vascular;  Laterality: Right;    Family History:  Family History  Problem Relation Age of Onset   Colon polyps Mother    Diabetes Mother    Heart attack Father    Diabetes Father    Stroke Paternal Grandfather    Colitis Sister    Heart disease Brother    Colitis Sister    Colon cancer Neg Hx     Social History: Social History   Socioeconomic History   Marital status: Married    Spouse name: Not on file   Number of children: Not on file   Years of education: Not on file   Highest education level: Not on file  Occupational History   Not on file  Social Needs   Financial resource strain: Not on file   Food insecurity    Worry: Not on file    Inability: Not on file   Transportation needs    Medical: Not on file     Non-medical: Not on file  Tobacco Use   Smoking status: Never Smoker   Smokeless tobacco: Never Used  Substance and Sexual Activity   Alcohol use: No   Drug use: No   Sexual activity: Never  Lifestyle   Physical activity    Days per week: Not on file    Minutes per session: Not on file   Stress: Not on file  Relationships   Social connections    Talks on phone: Not on file    Gets together: Not on file    Attends religious service: Not on file    Active member of club or organization: Not on file    Attends meetings of clubs or organizations: Not on file    Relationship status: Not on file  Other Topics Concern   Not on file  Social History Narrative   Not on file    Allergies:  Allergies  Allergen Reactions   Metformin And Related Diarrhea    Objective:    Vital Signs:   Temp:  [99.3 F (37.4 C)] (P) 99.3 F (37.4 C) (11/16 1332) Resp:  [22] 22 (11/16 1332) BP: (114)/(100) 114/100 (11/16 1332)   There were no vitals filed for this visit.   Physical Exam     General:  Chronically ill appearing AAM. No respiratory difficulty HEENT: Normal Neck: Supple. no JVD. Carotids 2+ bilat; no bruits. No lymphadenopathy or thyromegaly  appreciated. Cor: LVAD hum Driveline Site: no drainage.  Lungs: Clear Abdomen: Soft, nontender, nondistended. No hepatosplenomegaly. No bruits or masses. Good bowel sounds. Extremities: s/p Rt BKA. Ischemic left foot w/ dry gangrene  Neuro: Alert & oriented x 3, cranial nerves grossly intact. moves all 4 extremities w/o difficulty. Affect pleasant.   Telemetry   Sinus tach 102 bpm   EKG   12 lead ordered and pending   Labs     Basic Metabolic Panel: No results for input(s): NA, K, CL, CO2, GLUCOSE, BUN, CREATININE, CALCIUM, MG, PHOS in the last 168 hours.  Liver Function Tests: No results for input(s): AST, ALT, ALKPHOS, BILITOT, PROT, ALBUMIN in the last 168 hours. No results for input(s): LIPASE, AMYLASE in the  last 168 hours. No results for input(s): AMMONIA in the last 168 hours.  CBC: No results for input(s): WBC, NEUTROABS, HGB, HCT, MCV, PLT in the last 168 hours.  Cardiac Enzymes: No results for input(s): CKTOTAL, CKMB, CKMBINDEX, TROPONINI in the last 168 hours.  BNP: BNP (last 3 results) No results for input(s): BNP in the last 8760 hours.  ProBNP (last 3 results) No results for input(s): PROBNP in the last 8760 hours.   CBG: No results for input(s): GLUCAP in the last 168 hours.  Coagulation Studies: No results for input(s): LABPROT, INR in the last 72 hours.  Imaging: Ir Fluoro Guide Cv Line Right  Result Date: 07/22/2019 INDICATION: Poor venous access. In need of durable intravenous access for medication administration during impending surgery. EXAM: ULTRASOUND AND FLUOROSCOPIC GUIDED PICC LINE INSERTION MEDICATIONS: None. CONTRAST:  None FLUOROSCOPY TIME:  18 seconds (1 mGy) COMPLICATIONS: None immediate. TECHNIQUE: The procedure, risks, benefits, and alternatives were explained to the patient and informed written consent was obtained. A timeout was performed prior to the initiation of the procedure. The right neck and upper chest was prepped with chlorhexidine in a sterile fashion, and a sterile drape was applied covering the operative field. Maximum barrier sterile technique with sterile gowns and gloves were used for the procedure. A timeout was performed prior to the initiation of the procedure. Local anesthesia was provided with 1% lidocaine. Under direct ultrasound guidance, the right internal jugular vein was accessed with a micropuncture kit after the overlying soft tissues were anesthetized with 1% lidocaine. After the overlying soft tissues were anesthetized, a small venotomy incision was created and a micropuncture kit was utilized to access the right internal jugular vein. Real-time ultrasound guidance was utilized for vascular access including the acquisition of a  permanent ultrasound image documenting patency of the accessed vessel. A guidewire was advanced to the level of the superior caval-atrial junction for measurement purposes and the central venous catheter was cut to length. A peel-away sheath was placed and a 14 cm, 5 [place], dual lumen was inserted to level of the superior caval-atrial junction. A post procedure spot fluoroscopic was obtained. The catheter easily aspirated and flushed and was sutured in place. A dressing was placed. The patient tolerated the procedure well without immediate post procedural complication. FINDINGS: After catheter placement, the tip lies within the superior cavoatrial junction. The catheter aspirates and flushes normally and is ready for immediate use. IMPRESSION: Successful ultrasound and fluoroscopic guided placement of a right internal jugular vein approach, 14 cm, 5 [place], dual lumen non tunneled central venous catheter with tip at the superior caval-atrial junction. The central venous catheter is ready for immediate use. Electronically Signed   By: Sandi Mariscal M.D.   On: 07/22/2019  13:43   Ir US Guide Vasc Access Right  Result Date: 07/22/2019 INDICATION: Poor venous access. In need of durable intravenous access for medication administration during impending surgery. EXAM: ULTRASOUND AND FLUOROSCOPIC GUIDED PICC LINE INSERTION MEDICATIONS: None. CONTRAST:  None FLUOROSCOPY TIME:  18 seconds (1 mGy) COMPLICATIONS: None immediate. TECHNIQUE: The procedure, risks, benefits, and alternatives were explained to the patient and informed written consent was obtained. A timeout was performed prior to the initiation of the procedure. The right neck and upper chest was prepped with chlorhexidine in a sterile fashion, and a sterile drape was applied covering the operative field. Maximum barrier sterile technique with sterile gowns and gloves were used for the procedure. A timeout was performed prior to the initiation of the procedure.  Local anesthesia was provided with 1% lidocaine. Under direct ultrasound guidance, the right internal jugular vein was accessed with a micropuncture kit after the overlying soft tissues were anesthetized with 1% lidocaine. After the overlying soft tissues were anesthetized, a small venotomy incision was created and a micropuncture kit was utilized to access the right internal jugular vein. Real-time ultrasound guidance was utilized for vascular access including the acquisition of a permanent ultrasound image documenting patency of the accessed vessel. A guidewire was advanced to the level of the superior caval-atrial junction for measurement purposes and the central venous catheter was cut to length. A peel-away sheath was placed and a 14 cm, 5 [place], dual lumen was inserted to level of the superior caval-atrial junction. A post procedure spot fluoroscopic was obtained. The catheter easily aspirated and flushed and was sutured in place. A dressing was placed. The patient tolerated the procedure well without immediate post procedural complication. FINDINGS: After catheter placement, the tip lies within the superior cavoatrial junction. The catheter aspirates and flushes normally and is ready for immediate use. IMPRESSION: Successful ultrasound and fluoroscopic guided placement of a right internal jugular vein approach, 14 cm, 5 [place], dual lumen non tunneled central venous catheter with tip at the superior caval-atrial junction. The central venous catheter is ready for immediate use. Electronically Signed   By: Sandi Mariscal M.D.   On: 07/22/2019 13:43     Patient Profile   Mr Mcmanamon is a 53 year old withCAD s/p anterior MI in 2010 and NSTEMI in 3/18 with DES to pRCA and mLAD, gastroparesis,ischemic cardiomyopathy now s/p Heartmate 3 LVAD and ESRD on HD, factor V leiden, PAF, and severe bilateral PAD s/p amputation of the 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 Now being readmitted for left AKA,  scheduled for 11/17.   Assessment/Plan   1. Chronic Systolic CHF s/p HM3 LVAD: Ischemic cardiomyopathy. St Jude ICD. NSTEMI in March 2018 with DES to LAD and RCA, complicated by cardiogenic shock, low output requiring milrinone. Echo 8/18 with EF 20-25%, moderate MR. CPX (8/18) with severe functional impairment due to HF. He is s/p HM3 LVAD placement. He had pump thrombosis 6/19 due to [facility] outflow graft kinking. He had pump exchange for another HM3.Not a candidate for transplant with PAD.Volume status managed at HD.  - Coumadin currently on hold for anticipated BKA.  - check INR. If <1.7, will start IV heparin. - resume coumadin post op once cleared by VVS - Will hold ASA - monitor LDH    2. 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 Left foot failed to improved.  - Plan Lt AKA tomorrow w/ vascular surgery.  3. ESRD - HD WMF  - renal diet  - Will consult nephrology   4.CAD: NSTEMI in 3/18. LHC with 99% ulcerated lesion proximal RCA with left to right collaterals, 95% mid LAD stenosis after mid LAD stent. s/p PCI to RCA and LAD on 11/25/16.Denies chest pain.  - Continue statin - Hold ASA for now. Plan to resume post op   5. H/o DVTs: Factor V Leiden heterozygote. -on chronic a/c w/ coumadin but currently on hold for anticipated surgery   6. Diabetic gastroparesis: Long history of this. - on reglan 10 mg tid/ac and Marinol as outpatient  - Continue Protonix bid.  - He will continue erythromycinindefinitely.  - Limit narcotic use as much as possible.  7. HTN: on hydralazine 25 mg tid on non-HD days. Requires midodrine w/ HD.  - BP elevated at 114/100, in the setting of severe leg pain. Will give IV pain meds and monitor.   8. Lower Extremity Pain: will need IV meds.   Lyda Jester, PA-C 07/22/2019, 2:11 PM  Advanced Heart Failure Team Pager [phone] (M-F; 7a - 4p)  Please contact [facility] for  night-coverage after hours (4p -7a ) and weekends on [email or web address]  Patient seen with PA, agree with the above note.   He is being admitted today for L AKA tomorrow for left foot gangrene.  Main complaint today is left foot pain.  Temp 99.5, no subjective fever/chills.  No dyspnea.  He had HD today and had RIJ tunneled catheter placed in IR.   General: Mild distress from pain.  HEENT: Normal. Neck: Supple, JVP 7-8 cm. Carotids OK.  Cardiac:  Mechanical heart sounds with LVAD hum present.  Lungs:  CTAB, normal effort.  Abdomen:  NT, ND, no HSM. No bruits or masses. +BS  LVAD exit site: Well-healed and incorporated. Dressing dry and intact. No erythema or drainage. Stabilization device present and accurately applied. Driveline dressing changed daily per sterile technique. Extremities:  S/p right AKA, left foot gangrene.   Neuro:  Alert & oriented x 3. Cranial nerves grossly intact. Moves all 4 extremities w/o difficulty. Affect pleasant    Patient is admitted with left foot gangrene, plan for left AKA tomorrow with Dr. Oneida Alar.  Warfarin on hold, waiting for INR today.  Will hold ASA until after surgery.   He had HD today, will consult nephrology for inpatient HD on Wednesday.   Restart hydralazine 25 mg tid on non-HD days (MAP high on non-HD days).    Pain control with IV Fentanyl for now.    No fever, waiting for WBCs.  Worry that foot could be a source of infection.   Kyson Kupper 07/22/2019 2:29 PM

## 2019-07-22 NOTE — Progress Notes (Signed)
ANTICOAGULATION CONSULT NOTE - Initial Consult  Pharmacy Consult for IV heparin while Coumadin on hold Indication: LVAD  Allergies  Allergen Reactions  . Metformin And Related Diarrhea    Patient Measurements:   Heparin Dosing Weight: 82 kg  Vital Signs: Temp: (P) 99.3 F (37.4 C) (11/16 1332) Temp Source: (P) Oral (11/16 1332) BP: 114/100 (11/16 1332)  Labs: Recent Labs    07/22/19 1425 07/22/19 1430  HGB  --  9.8*  HCT  --  32.3*  PLT  --  194  LABPROT 14.8  --   INR 1.2  --     CrCl cannot be calculated (Patient's most recent lab result is older than the maximum 21 days allowed.).   Medical History: Past Medical History:  Diagnosis Date  . Angina   . ASCVD (arteriosclerotic cardiovascular disease)    , Anterior infarction 2005, LAD diagonal bifurcation intervention 03/2004  . Automatic implantable cardiac defibrillator -[employer]       . Benign neoplasm of colon   . CHF (congestive heart failure) ([place])   . Chronic systolic heart failure ([place])   . Coronary artery disease     Widely patent previously placed stents in the left anterior   . Crohn's disease ([place])   . Deep venous thrombosis (HCC)    Recurrent-on Coumadin  . Dialysis patient ([place])   . Dyspnea   . Gastroparesis   . GERD (gastroesophageal reflux disease)   . High cholesterol   . Hyperlipidemia   . Hypersomnolent    Previous diagnosis of narcolepsy  . Hypertension, essential   . Ischemic cardiomyopathy    Ejection fraction 15-20% catheterization 2010  . Type II or unspecified type diabetes mellitus without mention of complication, not stated as uncontrolled   . Unspecified gastritis and gastroduodenitis without mention of hemorrhage     Medications:  Infusions:  . sodium chloride    . [START ON 07/23/2019]  ceFAZolin (ANCEF) IV    . heparin      Assessment: 53 yo male with LVAD, on chronic Coumadin admitted for L AKA.  INR down to 1.2 after holding Coumadin for several days in  anticipation of procedure.  CBC okay.  Goal of Therapy:  Heparin level 0.3-0.4 preop Monitor platelets by anticoagulation protocol: Yes   Plan:  1. Start IV heparin gtt at 900 units/hr with no bolus. 2. Check heparin level in 8 hrs. 3. Daily heparin level and CBC. 4. F/u plans to resume anticoagulation after OR tomorrow?  Marguerite Olea, [facility] Clinical Pharmacist Phone [phone]  07/22/2019 3:19 PM

## 2019-07-22 NOTE — Progress Notes (Signed)
LVAD Coordinator Rounding Note:  Admitted 07/22/19 per Dr. Aundra Dubin for heparin bridge for left AKA.    HM III LVAD implanted on 06/12/17 by Dr. Darcey Nora under Destination Therapy criteria. Pump exchange performed on 03/01/18 per Dr. Darcey Nora for pump thrombus.   Pt escorted by VAD coordinator from IR to [facility] and handoff given to Arbie Cookey, Therapist, sports. The pt is asking for IV pain medicine for 10/10 left foot pain. IR placed a central line in R IJ.  Vital signs: Temp: 99.4 HR: 80 Doppler Pressure:  101 Automatic BP:  114/100 (106) O2 Sat: 98% RA  Wt:  lbs   LVAD interrogation reveals:  Speed: 5500 Flow: 4.2 Power: 4.5w PI: 5.1 Alarms: few Low Voltage Events:  5-10 daily Hematocrit: 31  Fixed speed: 5500 Low speed limit: 5200   Drive Line: Dressing C/D/I; anchor reapplied. Weekly dressing changes per bedside nurse; next dressing change due 07/28/19.  Labs:  LDH trend:   INR trend:   Anticoagulation Plan: -INR Goal: 2.0 - 3.0 -ASA Dose: 81 mg daily   Device: St Jude dual ICD - Therapies: on 200 bpm - Last check: 01/01/18 - 2 hrs Afl/Afib; EMI 04/02/18  Blood:  Infection:   Plan/Recommendations:  1. Call VAD pager if any questions about VAD equipment or drive line. 2. VAD Coordinator will accompany pt to OR in the morning at 0630.  [name] RN [place] Coordinator  Office: [phone]  24/7 Pager: [phone]

## 2019-07-22 NOTE — Procedures (Signed)
Pre procedural Diagnosis: Poor venous access Post Procedural Diagnosis: Same  Successful placement of right IJ approach 14 cm dual lumen non-tunneled CVC with tip at the superior caval-atrial junction.    EBL: None No immediate post procedural complication.  The central venous catheter is ready for immediate use.  Ronny Bacon, MD Pager #: [phone]

## 2019-07-22 NOTE — TOC Initial Note (Signed)
Transition of Care [facility]) - Initial/Assessment Note    Patient Details  Name: [name] MRN: [id number] Date of Birth: 1966/08/05  [facility] of [facility] CM/SW Contact:    Zenon Mayo, RN Phone Number: 07/22/2019, 4:15 PM  Clinical Narrative:                 From home with son, he is a LVAD patient, has no issues with getting medications, has transportation at dc.  TOC team will continue to follow for dc needs.   Expected Discharge Plan: Home/Self Care Barriers to Discharge: No Barriers Identified   Patient Goals and CMS Choice Patient states their goals for this hospitalization and ongoing recovery are:: builf upper body strength   Choice offered to / list presented to : NA  Expected Discharge Plan and Services Expected Discharge Plan: Home/Self Care In-house Referral: NA Discharge Planning Services: CM Consult Post Acute Care Choice: NA Living arrangements for the past 2 months: Single Family Home                 DME Arranged: (NA)         HH Arranged: NA          Prior Living Arrangements/Services Living arrangements for the past 2 months: Single Family Home Lives with:: Adult Children Patient language and need for interpreter reviewed:: Yes Do you feel safe going back to the place where you live?: Yes      Need for Family Participation in Patient Care: Yes (Comment) Care giver support system in place?: Yes (comment)   Criminal Activity/Legal Involvement Pertinent to Current Situation/Hospitalization: No - Comment as needed  Activities of Daily Living      Permission Sought/Granted                  Emotional Assessment Appearance:: Appears stated age Attitude/Demeanor/Rapport: Engaged Affect (typically observed): Appropriate Orientation: : Oriented to Self, Oriented to Place, Oriented to  Time, Oriented to Situation Alcohol / Substance Use: Not Applicable Psych Involvement: No (comment)  Admission diagnosis:  Infection  [B99.9] Patient Active Problem List   Diagnosis Date Noted  . Hospital discharge follow-up 04/15/2019  . Back pain 04/15/2019  . Warfarin anticoagulation 04/15/2019  . Ischemic pain of right foot 03/15/2019  . Ischemic foot 03/15/2019  . Subtherapeutic international normalized ratio (INR) 08/28/2018  . Acute vomiting   . Heart failure ([place]) 05/23/2018  . Hypertensive urgency 03/25/2018  . Benign essential HTN   . Crohn's disease with complication ([place])   . Diabetes mellitus type 2 in nonobese (HCC)   . Anemia of chronic disease   . Sinus tachycardia   . Left ventricular assist device (LVAD) complication 33/29/5188  . Left ventricular assist device (LVAD) complication, initial encounter 02/28/2018  . Factor V Leiden carrier ([place]) 01/10/2018  . History of creation of ostomy ([place]) 01/10/2018  . ESRD (end stage renal disease) ([place]) 12/11/2017  . Left ventricular assist device present ([place]) 12/05/2017  . ESRD (end stage renal disease) on dialysis ([place]) 10/29/2017  . Intractable nausea and vomiting 07/31/2017  . 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) ([place]) 07/10/2017  . LVAD (left ventricular assist device) present ([place])   . Palliative care by specialist   . Chronic systolic CHF (congestive heart failure) ([place]) 06/07/2017  . ACP (advance care planning)   . Encounter for central line placement   . Palliative care encounter   . CHF exacerbation ([place]) 04/05/2017  . Diabetic keto-acidosis ([place]) 04/05/2017  .  ESRD on dialysis ([place]) 01/17/2017  . AKI (acute kidney injury) ([place])   . Non-ST elevation (NSTEMI) myocardial infarction ([place])   . CHF (congestive heart failure) ([place]) 09/02/2016  . Elevated serum creatinine   . Encounter for therapeutic drug monitoring 08/03/2016  . Chest pain 04/26/2012  . SVT (supraventricular tachycardia) ([place]) 04/26/2012  . Gastroparesis 11/18/2011  . Coronary artery disease   . Ischemic cardiomyopathy   . Essential hypertension   . Automatic  implantable cardioverter-defibrillator in situ   . Deep venous thrombosis ([place])   . POLYP, COLON 09/25/2008  . Dyslipidemia, goal LDL below 70 09/25/2008  . GASTRITIS 09/25/2008  . DIVERTICULOSIS, COLON 09/25/2008  . History of Crohn's disease 09/05/2006   PCP:  Richarda Osmond, DO Pharmacy:   [id number]: [phone]: [phone]    Social Determinants of Health (SDOH) Interventions    Readmission Risk Interventions Readmission Risk Prevention Plan 07/22/2019 03/20/2019  Transportation Screening Complete Complete  Medication Review (Press photographer Complete Complete  HRI or Home Care Consult Complete Complete  SW Recovery Care/Counseling Consult Complete Complete  Palliative Care Screening Not Applicable [place] Not Applicable  Some recent data might be hidden

## 2019-07-22 NOTE — Plan of Care (Signed)
  Problem: Education: Goal: Patient will understand all VAD equipment and how it functions Outcome: Progressing Goal: Patient will be able to verbalize current INR target range and antiplatelet therapy for discharge home Outcome: Progressing   Problem: Cardiac: Goal: LVAD will function as expected and patient will experience no clinical alarms Outcome: Progressing   Problem: Education: Goal: Knowledge of the prescribed therapeutic regimen will improve Outcome: Progressing Goal: Ability to verbalize activity precautions or restrictions will improve Outcome: Progressing   Problem: Pain Management: Goal: Pain level will decrease with appropriate interventions Outcome: Progressing   Problem: Skin Integrity: Goal: Demonstration of wound healing without infection will improve Outcome: Progressing   Problem: Education: Goal: Knowledge of General Education information will improve Description: Including pain rating scale, medication(s)/side effects and non-pharmacologic comfort measures Outcome: Progressing

## 2019-07-23 ENCOUNTER — Inpatient Hospital Stay (HOSPITAL_COMMUNITY): Payer: BC Managed Care – PPO | Admitting: Anesthesiology

## 2019-07-23 ENCOUNTER — Encounter (HOSPITAL_COMMUNITY): Payer: Self-pay

## 2019-07-23 ENCOUNTER — Encounter (HOSPITAL_COMMUNITY)
Admission: RE | Disposition: A | Discharge status: Home Health | Payer: Self-pay | Source: Ambulatory Visit | Attending: Cardiology

## 2019-07-23 DIAGNOSIS — I739 Peripheral vascular disease, unspecified: Secondary | ICD-10-CM | POA: Diagnosis present

## 2019-07-23 DIAGNOSIS — I96 Gangrene, not elsewhere classified: Secondary | ICD-10-CM

## 2019-07-23 HISTORY — PX: AMPUTATION: SHX166

## 2019-07-23 LAB — BASIC METABOLIC PANEL
Anion gap: 13 (ref 5–15)
BUN: 24 mg/dL — ABNORMAL HIGH (ref 6–20)
CO2: 26 mmol/L (ref 22–32)
Calcium: 8.9 mg/dL (ref 8.9–10.3)
Chloride: 91 mmol/L — ABNORMAL LOW (ref 98–111)
Creatinine, Ser: 6.5 mg/dL — ABNORMAL HIGH (ref 0.61–1.24)
GFR calc Af Amer: 10 mL/min — ABNORMAL LOW (ref >60)
GFR calc non Af Amer: 9 mL/min — ABNORMAL LOW (ref >60)
Glucose, Bld: 158 mg/dL — ABNORMAL HIGH (ref 70–99)
Potassium: 3.7 mmol/L (ref 3.5–5.1)
Sodium: 130 mmol/L — ABNORMAL LOW (ref 135–145)

## 2019-07-23 LAB — CBC
HCT: 29.9 % — ABNORMAL LOW (ref 39.0–52.0)
Hemoglobin: 9 g/dL — ABNORMAL LOW (ref 13.0–17.0)
MCH: 25.7 pg — ABNORMAL LOW (ref 26.0–34.0)
MCHC: 30.1 g/dL (ref 30.0–36.0)
MCV: 85.4 fL (ref 80.0–100.0)
Platelets: 207 10*3/uL (ref 150–400)
RBC: 3.5 MIL/uL — ABNORMAL LOW (ref 4.22–5.81)
RDW: 17.3 % — ABNORMAL HIGH (ref 11.5–15.5)
WBC: 11.1 10*3/uL — ABNORMAL HIGH (ref 4.0–10.5)
nRBC: 0 % (ref 0.0–0.2)

## 2019-07-23 LAB — GLUCOSE, CAPILLARY
Glucose-Capillary: 143 mg/dL — ABNORMAL HIGH (ref 70–99)
Glucose-Capillary: 118 mg/dL — ABNORMAL HIGH (ref 70–99)
Glucose-Capillary: 119 mg/dL — ABNORMAL HIGH (ref 70–99)
Glucose-Capillary: 153 mg/dL — ABNORMAL HIGH (ref 70–99)
Glucose-Capillary: 217 mg/dL — ABNORMAL HIGH (ref 70–99)

## 2019-07-23 LAB — HEPARIN LEVEL (UNFRACTIONATED)
Heparin Unfractionated: <0.1 IU/mL — ABNORMAL LOW (ref 0.30–0.70)
Heparin Unfractionated: <0.1 IU/mL — ABNORMAL LOW (ref 0.30–0.70)

## 2019-07-23 LAB — PROTIME-INR
INR: 1.1 (ref 0.8–1.2)
Prothrombin Time: 13.9 seconds (ref 11.4–15.2)

## 2019-07-23 LAB — LACTATE DEHYDROGENASE: LDH: 179 U/L (ref 98–192)

## 2019-07-23 SURGERY — AMPUTATION, ABOVE KNEE
Anesthesia: General | Site: Knee | Laterality: Left

## 2019-07-23 MED ORDER — FENTANYL CITRATE (PF) 100 MCG/2ML IJ SOLN
25.0000 ug | INTRAMUSCULAR | Status: DC | PRN
  Administered 2019-07-23 (×2): 25 ug via INTRAVENOUS
  Administered 2019-07-23: 50 ug via INTRAVENOUS

## 2019-07-23 MED ORDER — ALUM & MAG HYDROXIDE-SIMETH 200-200-20 MG/5ML PO SUSP: 15.0000 mL | ORAL | Status: DC | PRN

## 2019-07-23 MED ORDER — LIDOCAINE 2% (20 MG/ML) 5 ML SYRINGE: INTRAMUSCULAR | Status: DC | PRN

## 2019-07-23 MED ORDER — SODIUM CHLORIDE 0.9% FLUSH: 3.0000 mL | INTRAVENOUS | Status: DC | PRN

## 2019-07-23 MED ORDER — ETOMIDATE 2 MG/ML IV SOLN
INTRAVENOUS | Status: DC | PRN
  Administered 2019-07-23: 16 mg via INTRAVENOUS

## 2019-07-23 MED ORDER — FENTANYL 40 MCG/ML IV SOLN
INTRAVENOUS | Status: DC
  Administered 2019-07-23: 1000 ug via INTRAVENOUS
  Administered 2019-07-23: 105 ug via INTRAVENOUS
  Administered 2019-07-23: 45 ug via INTRAVENOUS
  Administered 2019-07-23: 225 ug via INTRAVENOUS
  Administered 2019-07-23: 285 ug via INTRAVENOUS
  Administered 2019-07-24: 195 ug via INTRAVENOUS
  Administered 2019-07-24: 150 ug via INTRAVENOUS
  Administered 2019-07-24: 1000 ug via INTRAVENOUS
  Administered 2019-07-24: 165 ug via INTRAVENOUS
  Filled 2019-07-23 (×2): qty 25

## 2019-07-23 MED ORDER — NALOXONE HCL 0.4 MG/ML IJ SOLN: 0.4000 mg | INTRAMUSCULAR | Status: DC | PRN

## 2019-07-23 MED ORDER — SUCCINYLCHOLINE CHLORIDE 20 MG/ML IJ SOLN
INTRAMUSCULAR | Status: DC | PRN
  Administered 2019-07-23: 120 mg via INTRAVENOUS

## 2019-07-23 MED ORDER — OXYCODONE HCL 5 MG/5ML PO SOLN: 5.0000 mg | Freq: Once | ORAL | Status: DC | PRN

## 2019-07-23 MED ORDER — METOPROLOL TARTRATE 5 MG/5ML IV SOLN: 2.0000 mg | INTRAVENOUS | Status: DC | PRN

## 2019-07-23 MED ORDER — OXYCODONE-ACETAMINOPHEN 5-325 MG PO TABS
1.0000 | ORAL_TABLET | ORAL | Status: DC | PRN
  Administered 2019-07-24 – 2019-07-29 (×9): 2 via ORAL
  Filled 2019-07-23 (×11): qty 2

## 2019-07-23 MED ORDER — PHENYLEPHRINE HCL-NACL 10-0.9 MG/250ML-% IV SOLN
INTRAVENOUS | Status: DC | PRN
  Administered 2019-07-23: 25 ug/min via INTRAVENOUS

## 2019-07-23 MED ORDER — RENA-VITE PO TABS
1.0000 | ORAL_TABLET | Freq: Every day | ORAL | Status: DC
  Administered 2019-07-23 – 2019-07-30 (×8): 1 via ORAL
  Filled 2019-07-23 (×9): qty 1

## 2019-07-23 MED ORDER — GUAIFENESIN-DM 100-10 MG/5ML PO SYRP: 15.0000 mL | ORAL_SOLUTION | ORAL | Status: DC | PRN

## 2019-07-23 MED ORDER — SODIUM CHLORIDE 0.9 % IV SOLN: 250.0000 mL | INTRAVENOUS | Status: DC | PRN

## 2019-07-23 MED ORDER — ONDANSETRON HCL 4 MG/2ML IJ SOLN: 4.0000 mg | Freq: Four times a day (QID) | INTRAMUSCULAR | Status: DC | PRN

## 2019-07-23 MED ORDER — HEPARIN SODIUM (PORCINE) 5000 UNIT/ML IJ SOLN
5000.0000 [IU] | Freq: Three times a day (TID) | INTRAMUSCULAR | Status: DC
  Administered 2019-07-23 – 2019-07-24 (×5): 5000 [IU] via SUBCUTANEOUS
  Filled 2019-07-23 (×6): qty 1

## 2019-07-23 MED ORDER — LABETALOL HCL 5 MG/ML IV SOLN: 10.0000 mg | INTRAVENOUS | Status: DC | PRN

## 2019-07-23 MED ORDER — CEFAZOLIN SODIUM-DEXTROSE 2-4 GM/100ML-% IV SOLN
2.0000 g | Freq: Once | INTRAVENOUS | Status: AC
  Administered 2019-07-23: 2 g via INTRAVENOUS
  Filled 2019-07-23: qty 100

## 2019-07-23 MED ORDER — 0.9 % SODIUM CHLORIDE (POUR BTL) OPTIME
TOPICAL | Status: DC | PRN
  Administered 2019-07-23: 1000 mL

## 2019-07-23 MED ORDER — SODIUM CHLORIDE 0.9% FLUSH
3.0000 mL | Freq: Two times a day (BID) | INTRAVENOUS | Status: DC
  Administered 2019-07-26 – 2019-07-29 (×5): 3 mL via INTRAVENOUS

## 2019-07-23 MED ORDER — DOCUSATE SODIUM 100 MG PO CAPS
100.0000 mg | ORAL_CAPSULE | Freq: Every day | ORAL | Status: DC
  Administered 2019-07-25 – 2019-07-27 (×3): 100 mg via ORAL
  Filled 2019-07-23 (×5): qty 1

## 2019-07-23 MED ORDER — DIPHENHYDRAMINE HCL 12.5 MG/5ML PO ELIX
12.5000 mg | ORAL_SOLUTION | Freq: Four times a day (QID) | ORAL | Status: DC | PRN
  Filled 2019-07-23: qty 5

## 2019-07-23 MED ORDER — HYDRALAZINE HCL 20 MG/ML IJ SOLN: 5.0000 mg | INTRAMUSCULAR | Status: DC | PRN

## 2019-07-23 MED ORDER — PANTOPRAZOLE SODIUM 40 MG PO TBEC: 40.0000 mg | DELAYED_RELEASE_TABLET | Freq: Every day | ORAL | Status: DC

## 2019-07-23 MED ORDER — ONDANSETRON HCL 4 MG/2ML IJ SOLN: 4.0000 mg | Freq: Once | INTRAMUSCULAR | Status: DC | PRN

## 2019-07-23 MED ORDER — SODIUM CHLORIDE 0.9 % IV SOLN
62.5000 mg | INTRAVENOUS | Status: DC
  Administered 2019-07-24 – 2019-07-30 (×2): 62.5 mg via INTRAVENOUS
  Filled 2019-07-23 (×3): qty 5

## 2019-07-23 MED ORDER — PHENOL 1.4 % MT LIQD: 1.0000 | OROMUCOSAL | Status: DC | PRN

## 2019-07-23 MED ORDER — ASPIRIN EC 81 MG PO TBEC
81.0000 mg | DELAYED_RELEASE_TABLET | Freq: Every day | ORAL | Status: DC
  Administered 2019-07-24 – 2019-07-30 (×7): 81 mg via ORAL
  Filled 2019-07-23 (×7): qty 1

## 2019-07-23 MED ORDER — SODIUM CHLORIDE 0.9% FLUSH: 9.0000 mL | INTRAVENOUS | Status: DC | PRN

## 2019-07-23 MED ORDER — ONDANSETRON HCL 4 MG/2ML IJ SOLN
INTRAMUSCULAR | Status: DC | PRN
  Administered 2019-07-23: 4 mg via INTRAVENOUS

## 2019-07-23 MED ORDER — ROCURONIUM BROMIDE 50 MG/5ML IV SOSY
PREFILLED_SYRINGE | INTRAVENOUS | Status: DC | PRN
  Administered 2019-07-23: 40 mg via INTRAVENOUS

## 2019-07-23 MED ORDER — DIPHENHYDRAMINE HCL 50 MG/ML IJ SOLN: 12.5000 mg | Freq: Four times a day (QID) | INTRAMUSCULAR | Status: DC | PRN

## 2019-07-23 MED ORDER — OXYCODONE HCL 5 MG PO TABS: 5.0000 mg | ORAL_TABLET | Freq: Once | ORAL | Status: DC | PRN

## 2019-07-23 MED ORDER — MAGNESIUM SULFATE 2 GM/50ML IV SOLN: 2.0000 g | Freq: Every day | INTRAVENOUS | Status: DC | PRN

## 2019-07-23 MED ORDER — ALBUMIN HUMAN 5 % IV SOLN
INTRAVENOUS | Status: DC | PRN
  Administered 2019-07-23: 08:00:00 via INTRAVENOUS

## 2019-07-23 MED ORDER — POTASSIUM CHLORIDE CRYS ER 20 MEQ PO TBCR: 20.0000 meq | EXTENDED_RELEASE_TABLET | Freq: Every day | ORAL | Status: DC | PRN

## 2019-07-23 MED ORDER — FENTANYL CITRATE (PF) 100 MCG/2ML IJ SOLN
INTRAMUSCULAR | Status: DC | PRN
  Administered 2019-07-23: 50 ug via INTRAVENOUS
  Administered 2019-07-23: 25 ug via INTRAVENOUS

## 2019-07-23 MED ORDER — ACETAMINOPHEN 650 MG RE SUPP: 325.0000 mg | RECTAL | Status: DC | PRN

## 2019-07-23 MED ORDER — SUGAMMADEX SODIUM 200 MG/2ML IV SOLN
INTRAVENOUS | Status: DC | PRN
  Administered 2019-07-23: 175 mg via INTRAVENOUS

## 2019-07-23 MED ORDER — CALCITRIOL 0.25 MCG PO CAPS
0.5000 ug | ORAL_CAPSULE | ORAL | Status: DC
  Administered 2019-07-24 – 2019-07-30 (×4): 0.5 ug via ORAL

## 2019-07-23 MED ORDER — ACETAMINOPHEN 325 MG PO TABS: 325.0000 mg | ORAL_TABLET | ORAL | Status: DC | PRN

## 2019-07-23 MED ORDER — CHLORHEXIDINE GLUCONATE CLOTH 2 % EX PADS
6.0000 | MEDICATED_PAD | Freq: Every day | CUTANEOUS | Status: DC
  Administered 2019-07-23 – 2019-07-26 (×4): 6 via TOPICAL

## 2019-07-23 MED ORDER — MORPHINE SULFATE (PF) 2 MG/ML IV SOLN: 2.0000 mg | INTRAVENOUS | Status: DC | PRN

## 2019-07-23 SURGICAL SUPPLY — 47 items
BLADE SAW RECIP 87.9 MT (BLADE) ×2 IMPLANT
BNDG CMPR MED 15X6 ELC VLCR LF (GAUZE/BANDAGES/DRESSINGS) ×1
BNDG COHESIVE 4X5 TAN STRL (GAUZE/BANDAGES/DRESSINGS) ×2 IMPLANT
BNDG COHESIVE 6X5 TAN STRL LF (GAUZE/BANDAGES/DRESSINGS) ×2 IMPLANT
BNDG ELASTIC 6X15 VLCR STRL LF (GAUZE/BANDAGES/DRESSINGS) ×1 IMPLANT
BNDG ELASTIC 6X5.8 VLCR STR LF (GAUZE/BANDAGES/DRESSINGS) ×2 IMPLANT
BNDG GAUZE ELAST 4 BULKY (GAUZE/BANDAGES/DRESSINGS) ×2 IMPLANT
CANISTER SUCT 3000ML PPV (MISCELLANEOUS) ×2 IMPLANT
CLIP VESOCCLUDE MED 6/CT (CLIP) IMPLANT
COVER BACK TABLE 60X90IN (DRAPES) ×2 IMPLANT
COVER SURGICAL LIGHT HANDLE (MISCELLANEOUS) ×2 IMPLANT
COVER WAND RF STERILE (DRAPES) ×2 IMPLANT
DRAIN CHANNEL 19F RND (DRAIN) IMPLANT
DRAPE HALF SHEET 40X57 (DRAPES) ×2 IMPLANT
DRAPE ORTHO SPLIT 77X108 STRL (DRAPES) ×4
DRAPE SURG ORHT 6 SPLT 77X108 (DRAPES) ×2 IMPLANT
DRSG ADAPTIC 3X8 NADH LF (GAUZE/BANDAGES/DRESSINGS) ×2 IMPLANT
ELECT CAUTERY BLADE 6.4 (BLADE) ×2 IMPLANT
ELECT REM PT RETURN 9FT ADLT (ELECTROSURGICAL) ×2
ELECTRODE REM PT RTRN 9FT ADLT (ELECTROSURGICAL) ×1 IMPLANT
EVACUATOR SILICONE 100CC (DRAIN) IMPLANT
GAUZE SPONGE 4X4 12PLY STRL (GAUZE/BANDAGES/DRESSINGS) ×2 IMPLANT
GAUZE SPONGE 4X4 12PLY STRL LF (GAUZE/BANDAGES/DRESSINGS) ×1 IMPLANT
GLOVE BIO SURGEON STRL SZ7 (GLOVE) ×1 IMPLANT
GLOVE BIO SURGEON STRL SZ7.5 (GLOVE) ×2 IMPLANT
GLOVE BIOGEL PI IND STRL 7.0 (GLOVE) IMPLANT
GLOVE BIOGEL PI INDICATOR 7.0 (GLOVE) ×1
GOWN STRL REUS W/ TWL LRG LVL3 (GOWN DISPOSABLE) ×3 IMPLANT
GOWN STRL REUS W/TWL LRG LVL3 (GOWN DISPOSABLE) ×8
KIT BASIN OR (CUSTOM PROCEDURE TRAY) ×2 IMPLANT
KIT TURNOVER KIT B (KITS) ×2 IMPLANT
NS IRRIG 1000ML POUR BTL (IV SOLUTION) ×2 IMPLANT
PACK GENERAL/GYN (CUSTOM PROCEDURE TRAY) ×2 IMPLANT
PAD ARMBOARD 7.5X6 YLW CONV (MISCELLANEOUS) ×4 IMPLANT
STAPLER VISISTAT 35W (STAPLE) ×2 IMPLANT
STOCKINETTE IMPERVIOUS LG (DRAPES) ×2 IMPLANT
SUT ETHILON 3 0 PS 1 (SUTURE) IMPLANT
SUT SILK 2 0 SH (SUTURE) IMPLANT
SUT SILK 2 0 SH CR/8 (SUTURE) ×2 IMPLANT
SUT SILK 2 0 TIES 10X30 (SUTURE) ×2 IMPLANT
SUT VIC AB 2-0 CT1 18 (SUTURE) ×2 IMPLANT
SUT VIC AB 2-0 SH 18 (SUTURE) ×3 IMPLANT
SUT VIC AB 3-0 SH 27 (SUTURE) ×4
SUT VIC AB 3-0 SH 27X BRD (SUTURE) ×2 IMPLANT
TOWEL GREEN STERILE (TOWEL DISPOSABLE) ×4 IMPLANT
UNDERPAD 30X30 (UNDERPADS AND DIAPERS) ×2 IMPLANT
WATER STERILE IRR 1000ML POUR (IV SOLUTION) ×2 IMPLANT

## 2019-07-23 NOTE — Consult Note (Addendum)
[facility] Renal Consultation Note  Indication for Consultation:  Management of ESRD/hemodialysis; anemia, hypertension/volume and secondary hyperparathyroidism  HPI: [name] is a 53 y.o. male with ESRD on HD (mwf last hd yesterday), CAD, severe ICM(s/p Heartmate 3LVAD), Type 2 DM with severe gastroparesis,paroxymal A-fib on Coumadin,  Factor V Leiden,HTN, and severe PAD= R  AKA  06/02/19 Dr Early , and Left  SFA stenting(05/02/19)  then 05/29/19 L grt toe amputation /  now has  Left ft pain with dry gangrene  Of foot.    Admitted for planned amputation of L AKA  Dr  Oneida Alar. Last HD yesterday at [facility] via [facility] cath (no other HD access 2/2 vascular dz. )  Seen in room post op with post op pain controlled with pain meds .Denies any fevers,chills, sob, cp, cough .      Past Medical History:  Diagnosis Date  . Angina   . ASCVD (arteriosclerotic cardiovascular disease)    , Anterior infarction 2005, LAD diagonal bifurcation intervention 03/2004  . Automatic implantable cardiac defibrillator -St. Jude's       . Benign neoplasm of colon   . CHF (congestive heart failure) ([place])   . Chronic systolic heart failure ([place])   . Coronary artery disease     Widely patent previously placed stents in the left anterior   . Crohn's disease ([place])   . Deep venous thrombosis (HCC)    Recurrent-on Coumadin  . Dialysis patient ([place])   . Dyspnea   . Gastroparesis   . GERD (gastroesophageal reflux disease)   . High cholesterol   . Hyperlipidemia   . Hypersomnolent    Previous diagnosis of narcolepsy  . Hypertension, essential   . Ischemic cardiomyopathy    Ejection fraction 15-20% catheterization 2010  . Type II or unspecified type diabetes mellitus without mention of complication, not stated as uncontrolled   . Unspecified gastritis and gastroduodenitis without mention of hemorrhage     Past Surgical History:  Procedure Laterality Date  . A/V SHUNTOGRAM N/A 05/24/2018    Procedure: A/V SHUNTOGRAM;  Surgeon: Marty Heck, MD;  Location: [place] [facility];  Service: Cardiovascular;  Laterality: N/A;  . ABDOMINAL AORTOGRAM W/LOWER EXTREMITY N/A 03/18/2019   Procedure: ABDOMINAL AORTOGRAM W/LOWER EXTREMITY;  Surgeon: Waynetta Sandy, MD;  Location: [place] [facility];  Service: Cardiovascular;  Laterality: N/A;  . AMPUTATION Right 03/21/2019   Procedure: AMPUTATION DIGIT RIGHT FOURTH TOE;  Surgeon: Waynetta Sandy, MD;  Location: [place];  Service: Vascular;  Laterality: Right;  . AMPUTATION Left 05/29/2019   Procedure: AMPUTATION LEFT GREAT TOE;  Surgeon: Serafina Mitchell, MD;  Location: [place];  Service: Vascular;  Laterality: Left;  . AMPUTATION Right 06/02/2019   Procedure: ABOVE KNEE AMPUTATION;  Surgeon: Rosetta Posner, MD;  Location: [place];  Service: Vascular;  Laterality: Right;  . APPENDECTOMY    . AV FISTULA PLACEMENT Right 06/26/2017   Procedure: ARTERIOVENOUS (AV) FISTULA CREATION VERSUS GRAFT RIGHT  ARM;  Surgeon: Conrad [place], MD;  Location: [place];  Service: Vascular;  Laterality: Right;  . AV FISTULA PLACEMENT Right 10/31/2017   Procedure: INSERTION OF ARTERIOVENOUS (AV) ARTEGRAFT,  RIGHT UPPER ARM;  Surgeon: Conrad D[place]irk, MD;  Location: [place];  Service: Vascular;  Laterality: Right;  . AV FISTULA PLACEMENT Left 05/29/2018   Procedure: ARTERIOVENOUS (AV) FISTULA CREATION  LEFT UPPER EXTREMITY;  Surgeon: Waynetta Sandy, MD;  Location: [place];  Service: Vascular;  Laterality: Left;  .  AV FISTULA PLACEMENT Left 08/09/2018   Procedure: INSERTION OF ARTERIOVENOUS (AV) GORE-TEX GRAFT LEFT UPPER ARM;  Surgeon: Waynetta Sandy, MD;  Location: [place];  Service: Vascular;  Laterality: Left;  . CARDIAC DEFIBRILLATOR PLACEMENT  2010   St. Jude ICD  . CENTRAL LINE INSERTION Right 05/24/2018   Procedure: CENTRAL LINE INSERTION;  Surgeon: Marty Heck, MD;  Location: [place] [facility];  Service: Cardiovascular;   Laterality: Right;  . COLECTOMY  ~ 2003   "for Crohn's" ascending colon.    . CORONARY STENT INTERVENTION N/A 11/25/2016   Procedure: Coronary Stent Intervention;  Surgeon: Leonie Man, MD;  Location: [place] [facility];  Service: Cardiovascular;  Laterality: N/A;  . EP IMPLANTABLE DEVICE N/A 09/14/2016   Procedure: ICD Generator Changeout;  Surgeon: Deboraha Sprang, MD;  Location: [place] [facility];  Service: Cardiovascular;  Laterality: N/A;  . ESOPHAGOGASTRODUODENOSCOPY N/A 07/12/2017   Procedure: ESOPHAGOGASTRODUODENOSCOPY (EGD);  Surgeon: Jerene Bears, MD;  Location: [facility] ENDOSCOPY;  Service: Gastroenterology;  Laterality: N/A;  VAD pt so VAD team will need to accompany pt.   [name] FETAL SURGERY FOR CONGENITAL HERNIA     ????? pt has no knowledge of this..    . FISTULOGRAM Left 08/09/2018   Procedure: FISTULOGRAM LEFT ARM;  Surgeon: Waynetta Sandy, MD;  Location: [place];  Service: Vascular;  Laterality: Left;  . INGUINAL HERNIA REPAIR    . INSERTION OF DIALYSIS CATHETER Right 06/26/2017   Procedure: INSERTION OF TUNNELED  DIALYSIS CATHETER RIGHT INTERNAL JUGULAR;  Surgeon: Conrad [place] Perry, MD;  Location: [place];  Service: Vascular;  Laterality: Right;  . INSERTION OF IMPLANTABLE LEFT VENTRICULAR ASSIST DEVICE N/A 06/12/2017   Procedure: INSERTION OF IMPLANTABLE LEFT VENTRICULAR ASSIST [place] 3;  Surgeon: Ivin Poot, MD;  Location: [place];  Service: Open Heart Surgery;  Laterality: N/A;  HM3 LVAD  CIRC ARREST  NITRIC OXIDE  . INSERTION OF IMPLANTABLE LEFT VENTRICULAR ASSIST DEVICE N/A 02/28/2018   Procedure: REDO INSERTION OF IMPLANTABLE LEFT VENTRICULAR ASSIST DEVICE - HEARTMATE 3;  Surgeon: Ivin Poot, MD;  Location: [place];  Service: Open Heart Surgery;  Laterality: N/A;  . IR FLUORO GUIDE CV LINE RIGHT  07/22/2019  . IR REMOVAL TUN CV CATH W/O FL  02/26/2018  . IR THORACENTESIS ASP PLEURAL SPACE W/IMG GUIDE  06/28/2017  . IR US GUIDE VASC ACCESS RIGHT  07/22/2019   . LIGATION OF ARTERIOVENOUS  FISTULA Right 10/31/2017   Procedure: LIGATION OF BRACHIO-BASILIC VEIN TRANSPOSITION RIGHT ARM;  Surgeon: Conrad , MD;  Location: [place];  Service: Vascular;  Laterality: Right;  . LOWER EXTREMITY ANGIOGRAPHY  05/02/2019   Procedure: Lower Extremity Angiography;  Surgeon: Marty Heck, MD;  Location: [place] [facility];  Service: Cardiovascular;;  . MULTIPLE EXTRACTIONS WITH ALVEOLOPLASTY N/A 06/08/2017   Procedure: MULTIPLE EXTRACTION WITH ALVEOLOPLASTY AND PRE PROSTHETIC SURGERY AS NEEDED;  Surgeon: Lenn Cal, DDS;  Location: [place];  Service: Oral Surgery;  Laterality: N/A;  . PERIPHERAL VASCULAR BALLOON ANGIOPLASTY  03/18/2019   Procedure: PERIPHERAL VASCULAR BALLOON ANGIOPLASTY;  Surgeon: Waynetta Sandy, MD;  Location: [place] [facility];  Service: Cardiovascular;;  RT PT  . PERIPHERAL VASCULAR INTERVENTION  03/18/2019   Procedure: PERIPHERAL VASCULAR INTERVENTION;  Surgeon: Waynetta Sandy, MD;  Location: [place] [facility];  Service: Cardiovascular;;  RT SFA  . PERIPHERAL VASCULAR INTERVENTION  05/02/2019   Procedure: PERIPHERAL VASCULAR INTERVENTION;  Surgeon: Marty Heck, MD;  Location: [place] [facility];  Service: Cardiovascular;;  Lt. SFA  . RIGHT HEART CATH N/A 06/07/2017   Procedure: RIGHT HEART CATH;  Surgeon: Larey Dresser, MD;  Location: [place] [facility];  Service: Cardiovascular;  Laterality: N/A;  . RIGHT HEART CATH N/A 02/08/2018   Procedure: RIGHT HEART CATH;  Surgeon: Larey Dresser, MD;  Location: [place] [facility];  Service: Cardiovascular;  Laterality: N/A;  . RIGHT HEART CATH N/A 08/09/2018   Procedure: RIGHT HEART CATH;  Surgeon: Larey Dresser, MD;  Location: [place] [facility];  Service: Cardiovascular;  Laterality: N/A;  . RIGHT/LEFT HEART CATH AND CORONARY ANGIOGRAPHY N/A 11/23/2016   Procedure: Right/Left Heart Cath and Coronary Angiography;  Surgeon: Larey Dresser, MD;  Location: [place] [facility];  Service: Cardiovascular;  Laterality: N/A;  . TEE WITHOUT CARDIOVERSION N/A 06/12/2017   Procedure: TRANSESOPHAGEAL ECHOCARDIOGRAM (TEE);  Surgeon: Prescott Gum, Collier Salina, MD;  Location: [place];  Service: Open Heart Surgery;  Laterality: N/A;  . TEE WITHOUT CARDIOVERSION N/A 02/28/2018   Procedure: TRANSESOPHAGEAL ECHOCARDIOGRAM (TEE);  Surgeon: Prescott Gum, Collier Salina, MD;  Location: [place];  Service: Open Heart Surgery;  Laterality: N/A;  . TEMPORARY DIALYSIS CATHETER Left 05/24/2018   Procedure: TEMPORARY DIALYSIS CATHETER;  Surgeon: Marty Heck, MD;  Location: [place] [facility];  Service: Cardiovascular;  Laterality: Left;  . THROMBECTOMY AND REVISION OF ARTERIOVENTOUS (AV) GORETEX  GRAFT Right 12/12/2017   Procedure: THROMBECTOMY ARTERIOVENTOUS (AV) GORETEX  GRAFT RIGHT UPPER ARM;  Surgeon: Conrad Turbeville, MD;  Location: [place];  Service: Vascular;  Laterality: Right;  . TRANSMETATARSAL AMPUTATION Right 04/30/2019   Procedure: RIGHT TRANSMETATARSAL AMPUTATION;  Surgeon: Waynetta Sandy, MD;  Location: [facility];  Service: Vascular;  Laterality: Right;      Family History  Problem Relation Age of Onset  . Colon polyps Mother   . Diabetes Mother   . Heart attack Father   . Diabetes Father   . Stroke Paternal Grandfather   . Colitis Sister   . Heart disease Brother   . Colitis Sister   . Colon cancer Neg Hx       reports that he has never smoked. He has never used smokeless tobacco. He reports that he does not drink alcohol or use drugs.   Allergies  Allergen Reactions  . Metformin And Related Diarrhea    Prior to Admission medications   Medication Sig Start Date End Date Taking? Authorizing Provider  acetaminophen (TYLENOL) 325 MG tablet Take 2 tablets (650 mg total) by mouth every 4 (four) hours as needed for headache or mild pain. 03/22/19   Kroeger, Lorelee Cover., PA-C  aspirin EC 81 MG tablet Take 1 tablet (81 mg total) by mouth daily. 08/20/18   Eileen Stanford,  PA-C  atorvastatin (LIPITOR) 40 MG tablet Take 1 tablet (40 mg total) by mouth daily at 6 PM. 01/25/18   [name], MD  calcitRIOL (ROCALTROL) 0.5 MCG capsule Take 1 capsule (0.5 mcg total) by mouth every Monday, Wednesday, and Friday. 06/06/18   Georgiana Shore, NP  citalopram (CELEXA) 20 MG tablet Take 1 tablet (20 mg total) by mouth daily. 03/19/18   Georgiana Shore, NP  docusate sodium (COLACE) 100 MG capsule Take 1 capsule (100 mg total) by mouth daily as needed for mild constipation. Patient taking differently: Take 100 mg by mouth daily.  03/22/19   Kroeger, Lorelee Cover., PA-C  dronabinol (MARINOL) 2.5 MG capsule Take 1  capsule (2.5 mg total) by mouth 2 (two) times daily before lunch and supper. 03/27/18   Larey Dresser, MD  erythromycin (ERYTHROCIN) 250 MG tablet Take 250 mg by mouth 3 (three) times daily.    [provider]  gabapentin (NEURONTIN) 300 MG capsule Take 1 capsule (300 mg total) by mouth 2 (two) times daily. 04/16/19   Larey Dresser, MD  insulin glargine (LANTUS) 100 UNIT/ML injection Inject 0.1 mLs (10 Units total) into the skin daily. Patient taking differently: Inject 10 Units into the skin at bedtime.  05/23/19   Anderson, Chelsey L, DO  LORazepam (ATIVAN) 0.5 MG tablet Take 1 tablet (0.5 mg total) by mouth every 12 (twelve) hours as needed (nausea). 05/17/19   Larey Dresser, MD  metoCLOPramide (REGLAN) 10 MG tablet Take 1 tablet (10 mg total) by mouth 3 (three) times daily. Patient taking differently: Take 10 mg by mouth 3 (three) times daily with meals.  01/22/18   Georgiana Shore, NP  midodrine (PROAMATINE) 5 MG tablet Take 1 tablet (5 mg total) by mouth daily. Take 1 tablet before dialysis treatment Patient taking differently: Take 5 mg by mouth every Monday, Wednesday, and Friday. Take 1 tablet before dialysis treatment 04/04/19   Clegg, Amy D, NP  multivitamin (RENA-VIT) TABS tablet Take 1 tablet by mouth daily.    [provider]  NOVOLOG  100 UNIT/ML injection INJECT 7 UNITS INTO THE SKIN DAILY WITH SUPPER. Patient taking differently: Inject 7 Units into the skin daily with supper.  03/15/19   Anderson, Chelsey L, DO  oxyCODONE (ROXICODONE) 5 MG immediate release tablet Take 1 tablet (5 mg total) by mouth 2 (two) times daily as needed for severe pain. Take 1-2 tablets every 4-6 hours for severe pain. 05/21/19   Larey Dresser, MD  pantoprazole (PROTONIX) 40 MG tablet Take 1 tablet (40 mg total) by mouth 2 (two) times daily before lunch and supper. 05/07/19   Barrett, Evelene Croon, PA-C  promethazine (PHENERGAN) 12.5 MG tablet Take 1 tablet (12.5 mg total) by mouth every 6 (six) hours as needed for nausea or vomiting. 10/23/18   Larey Dresser, MD  sevelamer carbonate (RENVELA) 0.8 g PACK packet Take 0.8 g by mouth 3 (three) times daily with meals. 06/16/19   Leanor Kail, PA  warfarin (COUMADIN) 5 MG tablet Hold warfarin tonight and tomorrow (06/16/2019 and 06/17/2019). Check INR on 06/18/2019 and start medications based on readings  (previously on 7.4m on MWF and 542mall other days). 06/16/19   BhLeanor KailPA     Anti-infectives (From admission, onward)   Start     Dose/Rate Route Frequency Ordered Stop   07/23/19 2000  ceFAZolin (ANCEF) IVPB 2g/100 mL premix     2 g 200 mL/hr over 30 Minutes Intravenous  Once 07/23/19 1037     07/23/19 0600  ceFAZolin (ANCEF) IVPB 2g/100 mL premix     2 g 200 mL/hr over 30 Minutes Intravenous 30 min pre-op 07/22/19 1337 07/23/19 0757   07/22/19 1600  erythromycin (E-MYCIN) tablet 250 mg     250 mg Oral 3 times daily 07/22/19 1425        Results for orders placed or performed during the hospital encounter of 07/22/19 (from the past 48 hour(s))  Protime-INR     Status: None   Collection Time: 07/22/19  2:25 PM  Result Value Ref Range   Prothrombin Time 14.8 11.4 - 15.2 seconds   INR 1.2 0.8 - 1.2  Comment: (NOTE) INR goal varies based on device and disease states. Performed  at [facility], [place] [address] [place], [place] [address]   Lactate dehydrogenase     Status: Abnormal   Collection Time: 07/22/19  2:25 PM  Result Value Ref Range   LDH 219 (H) 98 - 192 U/L    Comment: Performed at [facility], [place] [address], [place], [place] [address]  Comprehensive metabolic panel     Status: Abnormal   Collection Time: 07/22/19  2:25 PM  Result Value Ref Range   Sodium 133 (L) 135 - 145 mmol/L   Potassium 3.4 (L) 3.5 - 5.1 mmol/L   Chloride 92 (L) 98 - 111 mmol/L   CO2 29 22 - 32 mmol/L   Glucose, Bld 120 (H) 70 - 99 mg/dL   BUN 14 6 - 20 mg/dL   Creatinine, Ser 5.12 (H) 0.61 - 1.24 mg/dL   Calcium 8.4 (L) 8.9 - 10.3 mg/dL   Total Protein 7.8 6.5 - 8.1 g/dL   Albumin 2.7 (L) 3.5 - 5.0 g/dL   AST 18 15 - 41 U/L   ALT 14 0 - 44 U/L   Alkaline Phosphatase 112 38 - 126 U/L   Total Bilirubin 0.6 0.3 - 1.2 mg/dL   GFR calc non Af Amer 12 (L) >60 mL/min   GFR calc Af Amer 14 (L) >60 mL/min   Anion gap 12 5 - 15    Comment: Performed at [facility], [place] [address] [place], [place] [address]  CBC     Status: Abnormal   Collection Time: 07/22/19  2:30 PM  Result Value Ref Range   WBC 12.9 (H) 4.0 - 10.5 K/uL   RBC 3.81 (L) 4.22 - 5.81 MIL/uL   Hemoglobin 9.8 (L) 13.0 - 17.0 g/dL   HCT 32.3 (L) 39.0 - 52.0 %   MCV 84.8 80.0 - 100.0 fL   MCH 25.7 (L) 26.0 - 34.0 pg   MCHC 30.3 30.0 - 36.0 g/dL   RDW 17.3 (H) 11.5 - 15.5 %   Platelets 194 150 - 400 K/uL   nRBC 0.2 0.0 - 0.2 %    Comment: Performed at [facility] [address], [place], [place] [address]  Glucose, capillary     Status: Abnormal   Collection Time: 07/22/19  4:17 PM  Result Value Ref Range   Glucose-Capillary 124 (H) 70 - 99 mg/dL   Comment 1 Notify RN    Comment 2 Document in Chart   Surgical PCR screen     Status: None   Collection Time: 07/22/19  8:32 PM   Specimen: Nasal Mucosa; Nasal Swab  Result Value Ref Range   MRSA, PCR NEGATIVE NEGATIVE    Staphylococcus aureus NEGATIVE NEGATIVE    Comment: (NOTE) The Xpert SA Assay (FDA approved for NASAL specimens in patients 28 years of age and older), is one component of a comprehensive surveillance program. It is not intended to diagnose infection nor to guide or monitor treatment. Performed at [facility], [place] [address], [place], [place] [address]   Glucose, capillary     Status: Abnormal   Collection Time: 07/22/19  8:49 PM  Result Value Ref Range   Glucose-Capillary 237 (H) 70 - 99 mg/dL  Heparin level (unfractionated)     Status: Abnormal   Collection Time: 07/22/19 11:30 PM  Result Value Ref Range   Heparin Unfractionated <0.10 (L) 0.30 - 0.70 IU/mL  Comment: (NOTE) If heparin results are below expected values, and patient dosage has  been confirmed, suggest follow up testing of antithrombin III levels. Performed at [facility], [place] [address] [place], [place] [address]   Lactate dehydrogenase     Status: None   Collection Time: 07/23/19  5:00 AM  Result Value Ref Range   LDH 179 98 - 192 U/L    Comment: Performed at [facility], [place] [address] [place], [place] [address]  Protime-INR     Status: None   Collection Time: 07/23/19  5:00 AM  Result Value Ref Range   Prothrombin Time 13.9 11.4 - 15.2 seconds   INR 1.1 0.8 - 1.2    Comment: (NOTE) INR goal varies based on device and disease states. Performed at [facility], [place] [address], [place], [place] [address]   CBC     Status: Abnormal   Collection Time: 07/23/19  5:00 AM  Result Value Ref Range   WBC 11.1 (H) 4.0 - 10.5 K/uL   RBC 3.50 (L) 4.22 - 5.81 MIL/uL   Hemoglobin 9.0 (L) 13.0 - 17.0 g/dL   HCT 29.9 (L) 39.0 - 52.0 %   MCV 85.4 80.0 - 100.0 fL   MCH 25.7 (L) 26.0 - 34.0 pg   MCHC 30.1 30.0 - 36.0 g/dL   RDW 17.3 (H) 11.5 - 15.5 %   Platelets 207 150 - 400 K/uL   nRBC 0.0 0.0 - 0.2 %    Comment: Performed at [facility] [address], [place], [place]  [address]  Basic metabolic panel     Status: Abnormal   Collection Time: 07/23/19  5:00 AM  Result Value Ref Range   Sodium 130 (L) 135 - 145 mmol/L   Potassium 3.7 3.5 - 5.1 mmol/L   Chloride 91 (L) 98 - 111 mmol/L   CO2 26 22 - 32 mmol/L   Glucose, Bld 158 (H) 70 - 99 mg/dL   BUN 24 (H) 6 - 20 mg/dL   Creatinine, Ser 6.50 (H) 0.61 - 1.24 mg/dL   Calcium 8.9 8.9 - 10.3 mg/dL   GFR calc non Af Amer 9 (L) >60 mL/min   GFR calc Af Amer 10 (L) >60 mL/min   Anion gap 13 5 - 15    Comment: Performed at [facility] [address], [place], [place] [address]  Heparin level (unfractionated)     Status: Abnormal   Collection Time: 07/23/19  5:00 AM  Result Value Ref Range   Heparin Unfractionated <0.10 (L) 0.30 - 0.70 IU/mL    Comment: REPEATED TO VERIFY (NOTE) If heparin results are below expected values, and patient dosage has  been confirmed, suggest follow up testing of antithrombin III levels. Performed at [facility], [place] [address], [place],  [address]   Glucose, capillary     Status: Abnormal   Collection Time: 07/23/19  5:25 AM  Result Value Ref Range   Glucose-Capillary 143 (H) 70 - 99 mg/dL   Comment 1 Notify RN    Comment 2 Document in Chart   Type and screen     Status: None (Preliminary result)   Collection Time: 07/23/19  7:54 AM  Result Value Ref Range   ABO/RH(D) O POS    Antibody Screen NEG    Sample Expiration 07/26/2019,2359    Unit Number [id number]    Blood Component Type RED CELLS,LR    Unit division 00  Status of Unit ALLOCATED    Transfusion Status OK TO TRANSFUSE    Crossmatch Result      Compatible Performed at [facility], [place] [address] [place], [place] [address]    Unit Number [id number]    Blood Component Type RED CELLS,LR    Unit division 00    Status of Unit ALLOCATED    Transfusion Status OK TO TRANSFUSE    Crossmatch Result Compatible   Glucose, capillary     Status: Abnormal   Collection Time: 07/23/19   9:22 AM  Result Value Ref Range   Glucose-Capillary 118 (H) 70 - 99 mg/dL   Comment 1 Notify RN    Comment 2 Document in Chart    *Note: Due to a large number of results and/or encounters for the requested time period, some results have not been displayed. A complete set of results can be found in Results Review.   ROS: see hpi   Physical Exam: Vitals:   07/23/19 1010 07/23/19 1015  BP:    Pulse: 87 89  Resp: 16 15  Temp:    SpO2: 100% 100%     General: alert , chronically ill AAM, NAD ,WD, WN HEENT: IXL , no icterus, eomi, MMdry  Neck: no jvd Heart:  LVAD  Hum  Lungs: CTA  No labored breathing  Abdomen: BS pos. Soft, NT, ND  Extremities: L AKA bandaged dry, clean, R AKA stump no edema ,  Skin: no overt rash, warm dry Neuro: alert OX3, no acute focal deficits appreciated   , moves all extrem. Dialysis Access: L IJ Permcath   Dialysis Orders: Center: [place]  on mwf . EDW 79kg HD Bath 2k, 2ca  Time 4hr Heparin 7000.  Access Lij permcath      Calcitriol 0.5 mcg po/HD  Mircera 225 mcg q 2wks (last on 07/17/19)    Venofer  12m  qwkly    Assessment/Plan 1. Left lower extrem  Dry gangrene /PAD now   Sp L AKA ( 07/23/19)- rx per Dr FOneida AlarVVS team .  2. ESRD -  HD MWF  K 3.7,  Continue on schedule. Next HD 11/18   3. Hypertension/volume  - BP stable , no excess vol on exam / uf  As tolerated new lower edw with AKA / on Midodrine 532mmwf hd  4. Anemia of esrd - HGB 9.0/   Max ESA just given 07/17/19  Weekly venofer  On hd  5. Metabolic bone disease -  Continue VDRA/binders. 6. CAD/ICM s/p LVAD - per HF team   7. DM Type 2 - hx severe gastroparesis    [name] HaberPA-C CaPioneer1971 661 38621/17/2020, 10:57 AM   Patient seen and examined, agree with above note with above modifications. ESRD [place] well [place] A little groggy after surgery but recalled my name.  [facility]  Just had max dose ESA 6 days ago,  will not be due again for 8 days KeCorliss ParishMD 07/23/2019

## 2019-07-23 NOTE — Anesthesia Postprocedure Evaluation (Signed)
Anesthesia Post Note  Patient: [name]  Procedure(s) Performed: AMPUTATION ABOVE KNEE LEFT (Left Knee)     Patient location during evaluation: PACU Anesthesia Type: General Level of consciousness: awake and alert Pain management: pain level controlled Vital Signs Assessment: post-procedure vital signs reviewed and stable Respiratory status: spontaneous breathing, nonlabored ventilation, respiratory function stable and patient connected to nasal cannula oxygen Cardiovascular status: blood pressure returned to baseline and stable Postop Assessment: no apparent nausea or vomiting Anesthetic complications: no    Last Vitals:  Vitals:   07/23/19 1010 07/23/19 1015  BP:    Pulse: 87 89  Resp: 16 15  Temp:    SpO2: 100% 100%    Last Pain:  Vitals:   07/23/19 1334  TempSrc:   PainSc: 7                  Brynlynn Walko COKER

## 2019-07-23 NOTE — Anesthesia Preprocedure Evaluation (Addendum)
Anesthesia Evaluation  Patient identified by MRN, date of birth, ID band Patient awake    Reviewed: Allergy & Precautions, NPO status , Patient's Chart, lab work & pertinent test results  Airway Mallampati: II  TM Distance: >3 FB Neck ROM: Full    Dental  (+) Edentulous Upper, Edentulous Lower   Pulmonary     + decreased breath sounds      Cardiovascular hypertension,  Rhythm:Regular  Mechanical hum from LVAD   Neuro/Psych    GI/Hepatic   Endo/Other  diabetes  Renal/GU      Musculoskeletal   Abdominal   Peds  Hematology   Anesthesia Other Findings   Reproductive/Obstetrics                            Anesthesia Physical Anesthesia Plan  ASA: III  Anesthesia Plan: General   Post-op Pain Management:    Induction: Intravenous  PONV Risk Score and Plan: Ondansetron  Airway Management Planned: Oral ETT  Additional Equipment:   Intra-op Plan:   Post-operative Plan: Extubation in OR  Informed Consent: I have reviewed the patients History and Physical, chart, labs and discussed the procedure including the risks, benefits and alternatives for the proposed anesthesia with the patient or authorized representative who has indicated his/her understanding and acceptance.       Plan Discussed with: CRNA and Anesthesiologist  Anesthesia Plan Comments:         Anesthesia Quick Evaluation

## 2019-07-23 NOTE — Progress Notes (Signed)
VAD Coordinator Procedure Note/Rounding note:   VAD Coordinator met patient in [place] and transported to [place] holding. Pt undergoing LEFT AKA  per Dr. Oneida Alar. Hemodynamics and VAD parameters monitored by myself and anesthesia throughout the procedure. Blood pressures were obtained with automatic cuff on Right arm.    Time: Doppler Auto  BP Flow PI Power Speed  Pre-procedure:  0742 104 113/89(98) 3.7 6.3 4.4 5500                    Sedation Induction: 0746  133/116(122) 3.6 6.3 4.4 5500   0800  104/63(71) 3.7 5.3 4.3 5500  Giving albumin 0815  (121) 3.2 8.0 4.4 5500   0830  134/116(123) 3.3 8.5 4.3 5500   0845  124/86(99) 3.8 4.3 4.3 5500   057  138/120(127) 3.2 8.4 4.3 5500                                                                          Recovery Area: 8144  121/94(104) 3.8 5.0 4.6 5500             Patient tolerated the procedure well. VAD Coordinator accompanied and remained with patient in recovery area.    Patient Disposition: Pt was transported [facility] to [facility] by VAD coordinator and PACU nurse. Fentanyl PCA started in PACU.   LVAD Coordinator Rounding Note:  Admitted 07/22/19 per Dr. Aundra Dubin for heparin bridge for left AKA.    HM III LVAD implanted on 06/12/17 by Dr. Darcey Nora under Destination Therapy criteria. Pump exchange performed on 03/01/18 per Dr. Darcey Nora for pump thrombus.   Pt escorted by VAD coordinator from [facility] to OR holding for scheduled Left AKA.  Vital signs: Temp: 98.5 HR: 81 Doppler Pressure:  104 Automatic BP:  109/65(73) O2 Sat: 100% RA  Wt:188.7  lbs   LVAD interrogation reveals:  Speed: 5500 Flow: 4.0 Power: 4.6w PI: 4.5 Alarms: few Low Voltage Events:  5-10 daily Hematocrit: 29  Fixed speed: 5500 Low speed limit: 5200   Drive Line: Dressing C/D/I; anchor reapplied. Weekly dressing changes per bedside nurse; next dressing change due 07/28/19.  Labs:  LDH trend: 179  INR trend: 1.1  Anticoagulation Plan: -INR Goal: 2.0 -  3.0 -ASA Dose: 81 mg daily -Heparin gtt turned off at 0700   Device: St Jude dual ICD - Therapies: on 200 bpm - Last check: 01/01/18 - 2 hrs Afl/Afib; EMI 04/02/18  Blood:  Infection:   Plan/Recommendations:  1. Call VAD pager if any questions about VAD equipment or drive line.  [name] RN [place] Coordinator  Office: [phone]  24/7 Pager: [phone]

## 2019-07-23 NOTE — Progress Notes (Signed)
[place] for Heparin and Coumadin on hold Indication: LVAD  Allergies  Allergen Reactions  . Metformin And Related Diarrhea    Patient Measurements: Height: 5' 8"  (172.7 cm) Weight: 188 lb 11.4 oz (85.6 kg) IBW/kg (Calculated) : 68.4 Heparin Dosing Weight: 82 kg  Vital Signs: Temp: 98.5 F (36.9 C) (11/17 0920) BP: 113/86 (11/17 1621) Pulse Rate: 86 (11/17 1621)  Labs: Recent Labs    07/22/19 1425 07/22/19 1430 07/22/19 2330 07/23/19 0500  HGB  --  9.8*  --  9.0*  HCT  --  32.3*  --  29.9*  PLT  --  194  --  207  LABPROT 14.8  --   --  13.9  INR 1.2  --   --  1.1  HEPARINUNFRC  --   --  <0.10* <0.10*  CREATININE 5.12*  --   --  6.50*    Estimated Creatinine Clearance: 13.8 mL/min (A) (by C-G formula based on SCr of 6.5 mg/dL (H)).   Medical History: Past Medical History:  Diagnosis Date  . Angina   . ASCVD (arteriosclerotic cardiovascular disease)    , Anterior infarction 2005, LAD diagonal bifurcation intervention 03/2004  . Automatic implantable cardiac defibrillator -St. Jude's       . Benign neoplasm of colon   . CHF (congestive heart failure) ([place])   . Chronic systolic heart failure ([place])   . Coronary artery disease     Widely patent previously placed stents in the left anterior   . Crohn's disease ([place])   . Deep venous thrombosis (H[facility])    Recurrent-on Coumadin  . Dialysis patient ([place])   . Dyspnea   . Gastroparesis   . GERD (gastroesophageal reflux disease)   . High cholesterol   . Hyperlipidemia   . Hypersomnolent    Previous diagnosis of narcolepsy  . Hypertension, essential   . Ischemic cardiomyopathy    Ejection fraction 15-20% catheterization 2010  . Type II or unspecified type diabetes mellitus without mention of complication, not stated as uncontrolled   . Unspecified gastritis and gastroduodenitis without mention of hemorrhage     Medications:  Infusions:  . sodium chloride    .  ceFAZolin  (ANCEF) IV    . [START ON 07/24/2019] ferric gluconate (FERRLECIT/NULECIT) IV    . magnesium sulfate bolus IVPB      Assessment: 53 yo male with LVAD, on chronic Coumadin admitted for L AKA.  INR down to 1.2 after holding Coumadin for several days in anticipation of procedure.  CBC okay.  Is now s/p AKA today.  Spoke to Dr. Aundra Dubin, and Dr. Oneida Alar.  Will not resume Coumadin until tomorrow (11/18).  Goal of Therapy:  Heparin level 0.3-0.4 preop Monitor platelets by anticoagulation protocol: Yes   Plan:  - Hold Coumadin and Heparin tonight. - F/u plans to resume heparin bridge  Hildred Laser, PharmD Clinical Pharmacist **Pharmacist phone directory can now be found on [email or web address] ([email or web address]).  Listed under [place].

## 2019-07-23 NOTE — Progress Notes (Signed)
ANTICOAGULATION CONSULT NOTE - Initial Consult  Pharmacy Consult for Heparin and Coumadin on hold Indication: LVAD  Allergies  Allergen Reactions  . Metformin And Related Diarrhea    Patient Measurements: Height: 5' 8"  (172.7 cm) Weight: 188 lb 11.4 oz (85.6 kg) IBW/kg (Calculated) : 68.4 Heparin Dosing Weight: 82 kg  Vital Signs: Temp: 98.5 F (36.9 C) (11/17 0920) Temp Source: Oral (11/17 0422) BP: 117/93 (11/17 1005) Pulse Rate: 89 (11/17 1015)  Labs: Recent Labs    07/22/19 1425 07/22/19 1430 07/22/19 2330 07/23/19 0500  HGB  --  9.8*  --  9.0*  HCT  --  32.3*  --  29.9*  PLT  --  194  --  207  LABPROT 14.8  --   --  13.9  INR 1.2  --   --  1.1  HEPARINUNFRC  --   --  <0.10* <0.10*  CREATININE 5.12*  --   --  6.50*    Estimated Creatinine Clearance: 13.8 mL/min (A) (by C-G formula based on SCr of 6.5 mg/dL (H)).   Medical History: Past Medical History:  Diagnosis Date  . Angina   . ASCVD (arteriosclerotic cardiovascular disease)    , Anterior infarction 2005, LAD diagonal bifurcation intervention 03/2004  . Automatic implantable cardiac defibrillator -St. Jude's       . Benign neoplasm of colon   . CHF (congestive heart failure) ([place])   . Chronic systolic heart failure ([place])   . Coronary artery disease     Widely patent previously placed stents in the left anterior   . Crohn's disease ([place])   . Deep venous thrombosis (H[facility])    Recurrent-on Coumadin  . Dialysis patient ([name])   . Dyspnea   . Gastroparesis   . GERD (gastroesophageal reflux disease)   . High cholesterol   . Hyperlipidemia   . Hypersomnolent    Previous diagnosis of narcolepsy  . Hypertension, essential   . Ischemic cardiomyopathy    Ejection fraction 15-20% catheterization 2010  . Type II or unspecified type diabetes mellitus without mention of complication, not stated as uncontrolled   . Unspecified gastritis and gastroduodenitis without mention of hemorrhage     Medications:   Infusions:  . sodium chloride    .  ceFAZolin (ANCEF) IV    . [START ON 07/24/2019] ferric gluconate (FERRLECIT/NULECIT) IV    . magnesium sulfate bolus IVPB      Assessment: 53 yo male with LVAD, on chronic Coumadin admitted for L AKA.  INR down to 1.2 after holding Coumadin for several days in anticipation of procedure.  CBC okay.  Is now s/p AKA today.  Spoke to Dr. Aundra Dubin, and Dr. Oneida Alar.  Will not resume Coumadin until tomorrow (11/18).  Goal of Therapy:  Heparin level 0.3-0.4 preop Monitor platelets by anticoagulation protocol: Yes   Plan:  1. Hold Coumadin and Heparin tonight. 2. Plan to resume Coumadin tonight. 3. F/u plans to resume heparin bridge at some point?  Marguerite Olea, [facility] Clinical Pharmacist Phone [phone]  07/23/2019 1:24 PM

## 2019-07-23 NOTE — Plan of Care (Signed)
  Problem: Education: Goal: Patient will understand all VAD equipment and how it functions Outcome: Progressing Goal: Patient will be able to verbalize current INR target range and antiplatelet therapy for discharge home Outcome: Progressing   Problem: Cardiac: Goal: LVAD will function as expected and patient will experience no clinical alarms Outcome: Progressing   Problem: Education: Goal: Knowledge of the prescribed therapeutic regimen will improve Outcome: Progressing Goal: Ability to verbalize activity precautions or restrictions will improve Outcome: Progressing Goal: Understanding of discharge needs will improve Outcome: Progressing   Problem: Activity: Goal: Ability to perform//tolerate increased activity and mobilize with assistive devices will improve Outcome: Progressing   Problem: Clinical Measurements: Goal: Postoperative complications will be avoided or minimized Outcome: Progressing   Problem: Self-Care: Goal: Ability to meet self-care needs will improve Outcome: Progressing   Problem: Self-Concept: Goal: Ability to maintain and perform role responsibilities to the fullest extent possible will improve Outcome: Progressing   Problem: Pain Management: Goal: Pain level will decrease with appropriate interventions Outcome: Progressing   Problem: Skin Integrity: Goal: Demonstration of wound healing without infection will improve Outcome: Progressing   Problem: Education: Goal: Knowledge of General Education information will improve Description: Including pain rating scale, medication(s)/side effects and non-pharmacologic comfort measures Outcome: Progressing   Problem: Health Behavior/Discharge Planning: Goal: Ability to manage health-related needs will improve Outcome: Progressing   Problem: Clinical Measurements: Goal: Ability to maintain clinical measurements within normal limits will improve Outcome: Progressing Goal: Will remain free from  infection Outcome: Progressing Goal: Diagnostic test results will improve Outcome: Progressing Goal: Respiratory complications will improve Outcome: Progressing Goal: Cardiovascular complication will be avoided Outcome: Progressing   Problem: Activity: Goal: Risk for activity intolerance will decrease Outcome: Progressing   Problem: Nutrition: Goal: Adequate nutrition will be maintained Outcome: Progressing   Problem: Coping: Goal: Level of anxiety will decrease Outcome: Progressing   Problem: Elimination: Goal: Will not experience complications related to bowel motility Outcome: Progressing Goal: Will not experience complications related to urinary retention Outcome: Progressing   Problem: Pain Managment: Goal: General experience of comfort will improve Outcome: Progressing   Problem: Safety: Goal: Ability to remain free from injury will improve Outcome: Progressing   Problem: Skin Integrity: Goal: Risk for impaired skin integrity will decrease Outcome: Progressing

## 2019-07-23 NOTE — Op Note (Signed)
[facility] OPERATIVE NOTE  Procedure: Left above knee amputation  Surgeon(s): Elam Dutch, MD  ASSISTANT: Ellsworth Lennox RNFA  Anesthesia: General  Specimens: left leg  PROCEDURE DETAIL: After obtaining informed consent, the patient was taken to the operating room. The patient was placed in supine position the operating room table. After induction of general anesthesia and endotracheal intubation the patient's Foley catheter was placed. Next patient's entire left lower extremity was prepped and draped in usual sterile fashion. A circumferential incision was made on the left leg just above the knee. The incision was carried down into the sucutaneous tissues down to level the saphenous vein. This was ligated and divided between silk ties. Soft tissues were taken down as well as the muscle and fascia with cautery. The superficial femoral artery and vein were dissected free circumferentially clamped and divided. These were suture ligated proximally. Remainder of the soft tissues were taken down with cautery. The periosteum was raised on the femur approximately 5 cm above the skin edge. The femur was divided at this level. The leg was passed off the table as a specimen. Hemostasis was obtained. The wound was thoroughly irrigated with normal saline solution. The fascial edges were reapproximated using interrupted 2 0 Vicryl sutures. The subcutaneous tissues reapproximated using a running 3-0 Vicryl suture. The skin was closed staples. Patient tolerated procedure well and there were no complications. Instrument sponge and needle counts correct in the case. Patient was taken to recovery in stable condition.  Ruta Hinds, MD [facility] [place] Office: [phone] Pager: [phone]

## 2019-07-23 NOTE — Transfer of Care (Signed)
Immediate Anesthesia Transfer of Care Note  Patient: [name]  Procedure(s) Performed: AMPUTATION ABOVE KNEE LEFT (Left Knee)  Patient Location: PACU  Anesthesia Type:General  Level of Consciousness: awake, alert  and oriented  Airway & Oxygen Therapy: Patient Spontanous Breathing and Patient connected to face mask oxygen  Post-op Assessment: Report given to RN, Post -op Vital signs reviewed and stable and Patient moving all extremities  Post vital signs: Reviewed and stable  Last Vitals:  Vitals Value Taken Time  BP 121/94 07/23/19 0920  Temp 36.9 C 07/23/19 0920  Pulse 98 07/23/19 0925  Resp 14 07/23/19 0926  SpO2 100 % 07/23/19 0925  Vitals shown include unvalidated device data.  Last Pain:  Vitals:   07/23/19 0920  TempSrc:   PainSc: 7       Patients Stated Pain Goal: 2 (82/66/66 4861)  Complications: No apparent anesthesia complications

## 2019-07-23 NOTE — Progress Notes (Signed)
[place] for IV heparin while Coumadin on hold Indication: LVAD  Allergies  Allergen Reactions  . Metformin And Related Diarrhea    Patient Measurements: Height: 5' 8"  (172.7 cm) Weight: 188 lb 11.4 oz (85.6 kg) IBW/kg (Calculated) : 68.4 Heparin Dosing Weight: 82 kg  Vital Signs: Temp: 99.4 F (37.4 C) (11/16 2355) Temp Source: Oral (11/16 2355) BP: 88/77 (11/16 2355) Pulse Rate: 98 (11/16 2355)  Labs: Recent Labs    07/22/19 1425 07/22/19 1430 07/22/19 2330  HGB  --  9.8*  --   HCT  --  32.3*  --   PLT  --  194  --   LABPROT 14.8  --   --   INR 1.2  --   --   HEPARINUNFRC  --   --  <0.10*  CREATININE 5.12*  --   --     Estimated Creatinine Clearance: 17.6 mL/min (A) (by C-G formula based on SCr of 5.12 mg/dL (H)).  Medications:  Infusions:  . sodium chloride    .  ceFAZolin (ANCEF) IV    . heparin 900 Units/hr (07/22/19 1545)    Assessment: 53 yo male with LVAD, on chronic Coumadin admitted for L AKA.  INR down to 1.2 after holding Coumadin for several days in anticipation of procedure.  Heparin level undetectable on gtt at 900 units/hr. No issues with line or bleeding reported per RN.  Plan for L AKA - scheduled for 0715 today.  Goal of Therapy:  Heparin level 0.3-0.4 preop Monitor platelets by anticoagulation protocol: Yes   Plan:  1. Increase IV heparin gtt to 1100 units/hr 2. Will f/u post OR today  Sherlon Handing, PharmD, BCPS Please see amion for clinical pharmacist phone list  07/23/2019 2:50 AM

## 2019-07-23 NOTE — Anesthesia Procedure Notes (Signed)
Procedure Name: Intubation Date/Time: 07/23/2019 7:42 AM Performed by: Scheryl Darter, CRNA Pre-anesthesia Checklist: Patient identified, Emergency Drugs available, Suction available and Patient being monitored Patient Re-evaluated:Patient Re-evaluated prior to induction Oxygen Delivery Method: Circle System Utilized Preoxygenation: Pre-oxygenation with 100% oxygen Induction Type: IV induction Ventilation: Mask ventilation without difficulty Laryngoscope Size: Mac and 4 Grade View: Grade I Tube type: Oral Tube size: 7.5 mm Number of attempts: 1 Airway Equipment and Method: Stylet and Oral airway Placement Confirmation: ETT inserted through vocal cords under direct vision,  positive ETCO2 and breath sounds checked- equal and bilateral Secured at: 23 cm Tube secured with: Tape Dental Injury: Teeth and Oropharynx as per pre-operative assessment

## 2019-07-23 NOTE — Progress Notes (Addendum)
Advanced Heart Failure Rounding Note  PCP-Cardiologist: Dr. Aundra Dubin   Subjective:    Just returned from OR. S/p left AKA. Complains of 9/10 post op pain. Now has PCA pump.   Denies dyspnea and CP. MAPs stable mid 80s.   LVAD interrogation HM3: Flow 3.6, speed 5500, power 4.4 PI 5.8. 5 PI events today.   Objective:   Weight Range: 85.6 kg Body mass index is 28.69 kg/m.   Vital Signs:   Temp:  [98.5 F (36.9 C)-99.4 F (37.4 C)] 98.5 F (36.9 C) (11/17 0920) Pulse Rate:  [29-100] 89 (11/17 1015) Resp:  [8-24] 15 (11/17 1015) BP: (88-121)/(65-100) 117/93 (11/17 1005) SpO2:  [94 %-100 %] 100 % (11/17 1015) Weight:  [85.6 kg] 85.6 kg (11/16 1332)    Weight change: Filed Weights   07/22/19 1332  Weight: 85.6 kg    Intake/Output:   Intake/Output Summary (Last 24 hours) at 07/23/2019 1023 Last data filed at 07/23/2019 0926 Gross per 24 hour  Intake 742.34 ml  Output 100 ml  Net 642.34 ml      Physical Exam    General:  chronically ill appearing AAM, looks older than actual age. No resp difficulty HEENT: Normal Neck: Supple. Elevated JVP . Carotids 2+ bilat; no bruits. No lymphadenopathy or thyromegaly appreciated. Cor: LVAD hum  Lungs: Clear Abdomen: Soft, nontender, nondistended. No hepatosplenomegaly. No bruits or masses. Good bowel sounds. Extremities: s/p remote Rt BKA, s/p Lt AKA Neuro: Alert & orientedx3, cranial nerves grossly intact. moves all 4 extremities w/o difficulty. Affect pleasant   Telemetry   NSR 80s-90s  EKG    No new EKG to review today   Labs    CBC Recent Labs    07/22/19 1430 07/23/19 0500  WBC 12.9* 11.1*  HGB 9.8* 9.0*  HCT 32.3* 29.9*  MCV 84.8 85.4  PLT 194 709   Basic Metabolic Panel Recent Labs    07/22/19 1425 07/23/19 0500  NA 133* 130*  K 3.4* 3.7  CL 92* 91*  CO2 29 26  GLUCOSE 120* 158*  BUN 14 24*  CREATININE 5.12* 6.50*  CALCIUM 8.4* 8.9   Liver Function Tests Recent Labs     07/22/19 1425  AST 18  ALT 14  ALKPHOS 112  BILITOT 0.6  PROT 7.8  ALBUMIN 2.7*   No results for input(s): LIPASE, AMYLASE in the last 72 hours. Cardiac Enzymes No results for input(s): CKTOTAL, CKMB, CKMBINDEX, TROPONINI in the last 72 hours.  BNP: BNP (last 3 results) No results for input(s): BNP in the last 8760 hours.  ProBNP (last 3 results) No results for input(s): PROBNP in the last 8760 hours.   D-Dimer No results for input(s): DDIMER in the last 72 hours. Hemoglobin A1C No results for input(s): HGBA1C in the last 72 hours. Fasting Lipid Panel No results for input(s): CHOL, HDL, LDLCALC, TRIG, CHOLHDL, LDLDIRECT in the last 72 hours. Thyroid Function Tests No results for input(s): TSH, T4TOTAL, T3FREE, THYROIDAB in the last 72 hours.  Invalid input(s): FREET3  Other results:   Imaging    Ir Fluoro Guide Cv Line Right  Result Date: 07/22/2019 INDICATION: Poor venous access. In need of durable intravenous access for medication administration during impending surgery. EXAM: ULTRASOUND AND FLUOROSCOPIC GUIDED PICC LINE INSERTION MEDICATIONS: None. CONTRAST:  None FLUOROSCOPY TIME:  18 seconds (1 mGy) COMPLICATIONS: None immediate. TECHNIQUE: The procedure, risks, benefits, and alternatives were explained to the patient and informed written consent was obtained. A timeout was performed  prior to the initiation of the procedure. The right neck and upper chest was prepped with chlorhexidine in a sterile fashion, and a sterile drape was applied covering the operative field. Maximum barrier sterile technique with sterile gowns and gloves were used for the procedure. A timeout was performed prior to the initiation of the procedure. Local anesthesia was provided with 1% lidocaine. Under direct ultrasound guidance, the right internal jugular vein was accessed with a micropuncture kit after the overlying soft tissues were anesthetized with 1% lidocaine. After the overlying soft  tissues were anesthetized, a small venotomy incision was created and a micropuncture kit was utilized to access the right internal jugular vein. Real-time ultrasound guidance was utilized for vascular access including the acquisition of a permanent ultrasound image documenting patency of the accessed vessel. A guidewire was advanced to the level of the superior caval-atrial junction for measurement purposes and the central venous catheter was cut to length. A peel-away sheath was placed and a 14 cm, 5 [place], dual lumen was inserted to level of the superior caval-atrial junction. A post procedure spot fluoroscopic was obtained. The catheter easily aspirated and flushed and was sutured in place. A dressing was placed. The patient tolerated the procedure well without immediate post procedural complication. FINDINGS: After catheter placement, the tip lies within the superior cavoatrial junction. The catheter aspirates and flushes normally and is ready for immediate use. IMPRESSION: Successful ultrasound and fluoroscopic guided placement of a right internal jugular vein approach, 14 cm, 5 [place], dual lumen non tunneled central venous catheter with tip at the superior caval-atrial junction. The central venous catheter is ready for immediate use. Electronically Signed   By: Sandi Mariscal M.D.   On: 07/22/2019 13:43   Ir US Guide Vasc Access Right  Result Date: 07/22/2019 INDICATION: Poor venous access. In need of durable intravenous access for medication administration during impending surgery. EXAM: ULTRASOUND AND FLUOROSCOPIC GUIDED PICC LINE INSERTION MEDICATIONS: None. CONTRAST:  None FLUOROSCOPY TIME:  18 seconds (1 mGy) COMPLICATIONS: None immediate. TECHNIQUE: The procedure, risks, benefits, and alternatives were explained to the patient and informed written consent was obtained. A timeout was performed prior to the initiation of the procedure. The right neck and upper chest was prepped with chlorhexidine in a  sterile fashion, and a sterile drape was applied covering the operative field. Maximum barrier sterile technique with sterile gowns and gloves were used for the procedure. A timeout was performed prior to the initiation of the procedure. Local anesthesia was provided with 1% lidocaine. Under direct ultrasound guidance, the right internal jugular vein was accessed with a micropuncture kit after the overlying soft tissues were anesthetized with 1% lidocaine. After the overlying soft tissues were anesthetized, a small venotomy incision was created and a micropuncture kit was utilized to access the right internal jugular vein. Real-time ultrasound guidance was utilized for vascular access including the acquisition of a permanent ultrasound image documenting patency of the accessed vessel. A guidewire was advanced to the level of the superior caval-atrial junction for measurement purposes and the central venous catheter was cut to length. A peel-away sheath was placed and a 14 cm, 5 [place], dual lumen was inserted to level of the superior caval-atrial junction. A post procedure spot fluoroscopic was obtained. The catheter easily aspirated and flushed and was sutured in place. A dressing was placed. The patient tolerated the procedure well without immediate post procedural complication. FINDINGS: After catheter placement, the tip lies within the superior cavoatrial junction. The catheter aspirates and  flushes normally and is ready for immediate use. IMPRESSION: Successful ultrasound and fluoroscopic guided placement of a right internal jugular vein approach, 14 cm, 5 [place], dual lumen non tunneled central venous catheter with tip at the superior caval-atrial junction. The central venous catheter is ready for immediate use. Electronically Signed   By: Sandi Mariscal M.D.   On: 07/22/2019 13:43   Dg Chest Port 1 View  Result Date: 07/22/2019 CLINICAL DATA:  Preop scheduled AKA history of hypertension coronary artery  disease EXAM: PORTABLE CHEST 1 VIEW COMPARISON:  05/28/2019 FINDINGS: Post sternotomy changes. LVAD similar in position. Left-sided pacing device as before. Right central venous catheter tip over the mid right atrium. Left jugular venous catheter tip over the SVC. Low lung volumes. Minimal atelectasis or scar right base. No consolidation, pleural effusion or pneumothorax. Coronary stents. IMPRESSION: No active disease. Minimal scarring or atelectasis at the right base. Electronically Signed   By: Donavan Foil M.D.   On: 07/22/2019 17:03      Medications:     Scheduled Medications:  aspirin EC  81 mg Oral Daily   [MAR Hold] atorvastatin  40 mg Oral q1800   [MAR Hold] calcitRIOL  0.5 mcg Oral Q M,W,F   [MAR Hold] citalopram  20 mg Oral Daily   [MAR Hold] dronabinol  2.5 mg Oral BID AC   [MAR Hold] erythromycin  250 mg Oral TID   fentaNYL   Intravenous Q4H   [MAR Hold] gabapentin  300 mg Oral BID   [MAR Hold] hydrALAZINE  25 mg Oral 3 times per day on Sun Tue Thu Sat   influenza vac split quadrivalent PF  0.5 mL Intramuscular Tomorrow-1000   [MAR Hold] insulin aspart  0-9 Units Subcutaneous TID WC   [MAR Hold] insulin glargine  10 Units Subcutaneous QHS   [MAR Hold] metoCLOPramide  10 mg Oral TID WC   [MAR Hold] midodrine  5 mg Oral Q M,W,F   multivitamin  1 tablet Oral Daily   [MAR Hold] pantoprazole  40 mg Oral BID AC   pneumococcal 23 valent vaccine  0.5 mL Intramuscular Tomorrow-1000   [MAR Hold] sevelamer carbonate  0.8 g Oral TID WC     Infusions:  sodium chloride     heparin 1,100 Units/hr (07/23/19 0309)     PRN Medications:  [MAR Hold] acetaminophen, diphenhydrAMINE **OR** diphenhydrAMINE, [MAR Hold] docusate sodium, fentaNYL (SUBLIMAZE) injection, lidocaine, [MAR Hold] LORazepam, [MAR Hold] LORazepam, naloxone **AND** sodium chloride flush, [MAR Hold] ondansetron (ZOFRAN) IV, ondansetron (ZOFRAN) IV, ondansetron (ZOFRAN) IV, oxyCODONE **OR**  oxyCODONE, [MAR Hold] promethazine, [MAR Hold] promethazine    Patient Profile   [name] is a 53 year old withCAD s/p anterior MI in 2010 and NSTEMI in 3/18 with DES to pRCA and mLAD, gastroparesis,ischemic cardiomyopathy now s/p Heartmate 3 LVAD and ESRD on HD, factor V leiden, PAF, and severe bilateral PAD s/p amputation of the 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 Now being readmitted for planned left AKA.  Assessment/Plan   1. Chronic Systolic CHF s/p HM3 LVAD: Ischemic cardiomyopathy. [employer] ICD. NSTEMI in March 2018 with DES to LAD and RCA, complicated by cardiogenic shock, low output requiring milrinone. Echo 8/18 with EF 20-25%, moderate [name]. CPX (8/18) with severe functional impairment due to HF. He is s/p HM3 LVAD placement. He had pump thrombosis 6/19 due to [facility] outflow graft kinking. He had pump exchange for another HM3.Not a candidate for transplant with PAD.Volume status managed at HD.  -  INR 1.1 today. Coumadin held for AKA. Plan to resume dose tonight. PharmD to assist w/ dosing.  - LDH  179   2. 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 Left foot failed to improved.  - s/p left AKA earlier today.  - post op management per VVS  3. ESRD - HD WMF  - renal diet  - Will consult nephrology to resume HD tomorrow   4.CAD: NSTEMI in 3/18. LHC with 99% ulcerated lesion proximal RCA with left to right collaterals, 95% mid LAD stenosis after mid LAD stent. s/p PCI to RCA and LAD on 11/25/16.Denies chest pain.  - Continue statin - plan to resume asa 81 mg    5. H/o DVTs: Factor V Leiden heterozygote. -Plan to resume coumadin tonight  6. Diabetic gastroparesis: Long history of this. - on reglan 10 mg tid/ac and Marinol as outpatient  - Continue Protonix bid.  - He will continue erythromycinindefinitely.  - Limit narcotic use as much as possible.  7. HTN: currently controlled. - Continue  hydralazine 25 mg tid on non-HD days.  - Requires midodrine w/ HD.  - BP ok today. Monitor   8. Lower Extremity Pain:  - PCA pump for post op pain   9. Hyponatremia: Na 130. Chronically low.  -monitor  10. T2DM:  -continue home insulin regimen   Length of Stay: [place], PA-C  07/23/2019, 10:23 AM  Advanced Heart Failure Team Pager [phone] (M-F; 7a - 4p)  Please contact [facility] for night-coverage after hours (4p -7a ) and weekends on [email or web address]  Patient seen with PA, agree with the above.   Patient had L AKA today, no complications.  Team discussed with Dr Oneida Alar, plan to start warfarin and heparin bridge tomorrow if surgical site stable.    HD tomorrow.   [name] 07/23/2019

## 2019-07-23 NOTE — Interval H&P Note (Signed)
History and Physical Interval Note:  07/23/2019 7:26 AM  [name]  has presented today for surgery, with the diagnosis of NON-VIABLE TISSUE LEFT LOWER EXTREMITY.  The various methods of treatment have been discussed with the patient and family. After consideration of risks, benefits and other options for treatment, the patient has consented to  Procedure(s): AMPUTATION ABOVE KNEE LEFT (Left) as a surgical intervention.  The patient's history has been reviewed, patient examined, no change in status, stable for surgery.  I have reviewed the patient's chart and labs.  Questions were answered to the patient's satisfaction.     Ruta Hinds

## 2019-07-24 ENCOUNTER — Encounter (HOSPITAL_COMMUNITY): Payer: Self-pay | Admitting: Vascular Surgery

## 2019-07-24 LAB — BASIC METABOLIC PANEL WITH GFR
Anion gap: 16 — ABNORMAL HIGH (ref 5–15)
BUN: 33 mg/dL — ABNORMAL HIGH (ref 6–20)
CO2: 25 mmol/L (ref 22–32)
Calcium: 8.9 mg/dL (ref 8.9–10.3)
Chloride: 89 mmol/L — ABNORMAL LOW (ref 98–111)
Creatinine, Ser: 8.39 mg/dL — ABNORMAL HIGH (ref 0.61–1.24)
GFR calc Af Amer: 8 mL/min — ABNORMAL LOW
GFR calc non Af Amer: 6 mL/min — ABNORMAL LOW
Glucose, Bld: 75 mg/dL (ref 70–99)
Potassium: 4.9 mmol/L (ref 3.5–5.1)
Sodium: 130 mmol/L — ABNORMAL LOW (ref 135–145)

## 2019-07-24 LAB — CBC
HCT: 29.3 % — ABNORMAL LOW (ref 39.0–52.0)
HCT: 26.4 % — ABNORMAL LOW (ref 39.0–52.0)
Hemoglobin: 8.7 g/dL — ABNORMAL LOW (ref 13.0–17.0)
Hemoglobin: 7.7 g/dL — ABNORMAL LOW (ref 13.0–17.0)
MCH: 25.6 pg — ABNORMAL LOW (ref 26.0–34.0)
MCH: 25.3 pg — ABNORMAL LOW (ref 26.0–34.0)
MCHC: 29.7 g/dL — ABNORMAL LOW (ref 30.0–36.0)
MCHC: 29.2 g/dL — ABNORMAL LOW (ref 30.0–36.0)
MCV: 86.2 fL (ref 80.0–100.0)
MCV: 86.8 fL (ref 80.0–100.0)
Platelets: 243 K/uL (ref 150–400)
Platelets: 160 10*3/uL (ref 150–400)
RBC: 3.4 MIL/uL — ABNORMAL LOW (ref 4.22–5.81)
RBC: 3.04 MIL/uL — ABNORMAL LOW (ref 4.22–5.81)
RDW: 17.2 % — ABNORMAL HIGH (ref 11.5–15.5)
RDW: 17.2 % — ABNORMAL HIGH (ref 11.5–15.5)
WBC: 12.7 K/uL — ABNORMAL HIGH (ref 4.0–10.5)
WBC: 10.8 10*3/uL — ABNORMAL HIGH (ref 4.0–10.5)
nRBC: 0.2 % (ref 0.0–0.2)
nRBC: 0 % (ref 0.0–0.2)

## 2019-07-24 LAB — GLUCOSE, CAPILLARY
Glucose-Capillary: 59 mg/dL — ABNORMAL LOW (ref 70–99)
Glucose-Capillary: 68 mg/dL — ABNORMAL LOW (ref 70–99)
Glucose-Capillary: 117 mg/dL — ABNORMAL HIGH (ref 70–99)
Glucose-Capillary: 67 mg/dL — ABNORMAL LOW (ref 70–99)
Glucose-Capillary: 156 mg/dL — ABNORMAL HIGH (ref 70–99)
Glucose-Capillary: 127 mg/dL — ABNORMAL HIGH (ref 70–99)
Glucose-Capillary: 125 mg/dL — ABNORMAL HIGH (ref 70–99)

## 2019-07-24 LAB — PROTIME-INR
INR: 1.2 (ref 0.8–1.2)
Prothrombin Time: 14.7 s (ref 11.4–15.2)

## 2019-07-24 LAB — LACTATE DEHYDROGENASE: LDH: 183 U/L (ref 98–192)

## 2019-07-24 LAB — HEPARIN LEVEL (UNFRACTIONATED): Heparin Unfractionated: <0.1 IU/mL — ABNORMAL LOW (ref 0.30–0.70)

## 2019-07-24 MED ORDER — ONDANSETRON HCL 4 MG/2ML IJ SOLN: 4.0000 mg | Freq: Four times a day (QID) | INTRAMUSCULAR | Status: DC | PRN

## 2019-07-24 MED ORDER — WARFARIN SODIUM 5 MG PO TABS
5.0000 mg | ORAL_TABLET | Freq: Once | ORAL | Status: AC
  Administered 2019-07-24: 5 mg via ORAL
  Filled 2019-07-24: qty 1

## 2019-07-24 MED ORDER — MIDODRINE HCL 5 MG PO TABS
ORAL_TABLET | ORAL | Status: AC
  Filled 2019-07-24: qty 1

## 2019-07-24 MED ORDER — FENTANYL 40 MCG/ML IV SOLN
INTRAVENOUS | Status: DC
  Administered 2019-07-24: 15 ug via INTRAVENOUS

## 2019-07-24 MED ORDER — SODIUM CHLORIDE 0.9% FLUSH: 10.0000 mL | INTRAVENOUS | Status: DC | PRN

## 2019-07-24 MED ORDER — SODIUM CHLORIDE 0.9% FLUSH
10.0000 mL | Freq: Two times a day (BID) | INTRAVENOUS | Status: DC
  Administered 2019-07-24: 10 mL
  Administered 2019-07-25: 20 mL
  Administered 2019-07-25 – 2019-07-29 (×7): 10 mL

## 2019-07-24 MED ORDER — CALCITRIOL 0.5 MCG PO CAPS
ORAL_CAPSULE | ORAL | Status: AC
  Administered 2019-07-24: 13:00:00
  Filled 2019-07-24: qty 1

## 2019-07-24 MED ORDER — NALOXONE HCL 0.4 MG/ML IJ SOLN
0.4000 mg | INTRAMUSCULAR | Status: DC | PRN
  Administered 2019-07-24 (×2): 0.4 mg via INTRAVENOUS
  Filled 2019-07-24 (×2): qty 1

## 2019-07-24 MED ORDER — SODIUM CHLORIDE 0.9% FLUSH: 9.0000 mL | INTRAVENOUS | Status: DC | PRN

## 2019-07-24 MED ORDER — HEPARIN SODIUM (PORCINE) 1000 UNIT/ML IJ SOLN
INTRAMUSCULAR | Status: AC
  Administered 2019-07-24: 13:00:00
  Filled 2019-07-24: qty 4

## 2019-07-24 MED ORDER — SODIUM CHLORIDE 0.9 % IV BOLUS
250.0000 mL | Freq: Once | INTRAVENOUS | Status: AC
  Administered 2019-07-24: 250 mL via INTRAVENOUS

## 2019-07-24 MED ORDER — DIPHENHYDRAMINE HCL 12.5 MG/5ML PO ELIX
12.5000 mg | ORAL_SOLUTION | Freq: Four times a day (QID) | ORAL | Status: DC | PRN
  Filled 2019-07-24: qty 5

## 2019-07-24 MED ORDER — NALOXONE HCL 0.4 MG/ML IJ SOLN
0.4000 mg | Freq: Once | INTRAMUSCULAR | Status: AC
  Administered 2019-07-24: 0.4 mg via INTRAVENOUS

## 2019-07-24 MED ORDER — SODIUM CHLORIDE 0.9 % IV BOLUS: 250.0000 mL | Freq: Once | INTRAVENOUS | Status: AC

## 2019-07-24 MED ORDER — DIPHENHYDRAMINE HCL 50 MG/ML IJ SOLN: 12.5000 mg | Freq: Four times a day (QID) | INTRAMUSCULAR | Status: DC | PRN

## 2019-07-24 MED ORDER — WARFARIN - PHARMACIST DOSING INPATIENT
Freq: Every day | Status: DC
  Administered 2019-07-24: 17:00:00
  Administered 2019-07-25: 1

## 2019-07-24 MED ORDER — NALOXONE HCL 0.4 MG/ML IJ SOLN
INTRAMUSCULAR | Status: AC
  Filled 2019-07-24: qty 1

## 2019-07-24 NOTE — Procedures (Signed)
Patient was seen on dialysis and the procedure was supervised.  BFR 400  Via TDC BP is  90/42.   Patient appears to be tolerating treatment well  Louis Meckel 07/24/2019

## 2019-07-24 NOTE — Progress Notes (Signed)
Physician notified: Judson Roch, VAD coord  At: 1930  Regarding: BP 79/67 (73), groggy RASS -1/-2. Afebrile, NO VAD alarms.   Order(s): give 197m bolus. Hold PCA.

## 2019-07-24 NOTE — Plan of Care (Signed)
  Problem: Education: Goal: Patient will be able to verbalize current INR target range and antiplatelet therapy for discharge home Outcome: Progressing   Problem: Education: Goal: Ability to verbalize activity precautions or restrictions will improve Outcome: Progressing   Problem: Clinical Measurements: Goal: Postoperative complications will be avoided or minimized Outcome: Progressing

## 2019-07-24 NOTE — Progress Notes (Signed)
PT Cancellation Note  Patient Details Name: [name] MRN: [id number] DOB: 03/25/1966   Cancelled Treatment:    Reason Eval/Treat Not Completed: Patient at procedure or test/unavailable (HD). Will follow-up for PT evaluation as schedule permits.  Mabeline Caras, PT, DPT Acute Rehabilitation Services  Pager [phone] Office [place] 07/24/2019, 9:56 AM

## 2019-07-24 NOTE — Progress Notes (Signed)
CSW met at bedside with patient who requested to speak with CSW. Patient shared previous conversations about "comfort" and goals of care conversations with CSW on last admission. Patient shared concerns that his son is not aware of the "long haul" required for his care. He spoke of the multiple medical issues and he was told that he "may never walk again". Patient states he is not interested in a prosthetic or going back to [facility] for any rehab stay. The discussion involved quality of life vs. quantity of life.  Patient shared that he children will do anything for him and he the same. Patient acknowledged that the family have not had any discussions about goals of care or end of life options. CSW suggested further discussion with Palliative Care and consider a family meeting to discuss plan of care. Patient expressed desire to have meeting and will discuss with his children over the next few days and would like a meeting scheduled for early next week. CSW will discuss further with Dr. Aundra Dubin and Palliative Care. Patient grateful for the discussion. CSW will continue to follow for supportive intervention around goals of care. Raquel Sarna, [place], [place]

## 2019-07-24 NOTE — Progress Notes (Signed)
Subjective:  Seen on HD-  No c/o's  Objective Vital signs in last 24 hours: Vitals:   07/24/19 0754 07/24/19 0800 07/24/19 0830 07/24/19 0900  BP: (P) 107/78 (P) 108/88 (!) (P) 88/34 (!) (P) 96/54  Pulse: (P) 90 (P) 76 (P) 90 (P) 61  Resp: (P) 10 (P) 14 (P) 10 (!) (P) 9  Temp:      TempSrc:      SpO2:      Weight:      Height:       Weight change: -5.3 kg  Intake/Output Summary (Last 24 hours) at 07/24/2019 0925 Last data filed at 07/23/2019 9166 Gross per 24 hour  Intake 600 ml  Output 50 ml  Net 550 ml   Dialysis Orders: Center: [facility]  on mwf . EDW 79kg HD Bath 2k, 2ca  Time 4hr Heparin 7000.  Access Lij permcath      Calcitriol 0.5 mcg po/HD  Mircera 225 mcg q 2wks (last on 07/17/19)    Venofer  24m  qwkly     Assessment/ Plan: Pt is a 53y.o. yo male with ESRD and LVAD who was admitted on 07/22/2019 with gangrene of left leg  Assessment/Plan: 1. PAD-- s/p left AKA 11/17 per VVS, now bilat AKA-  no abx-  recovering  2. ESRD - normally MWF-  HD today on schedule via TDC 3. Anemia-  Dropping with surgery - max ESA given 11/11 as OP - getting iron 4. Secondary hyperparathyroidism- on rocaltrol and renvela 5. HTN/volume- goal of only 1 liter today-  Sodium low which argues for some volume overload- also face is puffy.  Will need lower EDW at discharge.  On hydralazine and midodrine ? dont think was on hydralazine as OP -  Will stop hydralazine-  Really dont think will be able to tolerate long term and need to keep as much volume off as possible especially with bilat AKA  KLouis Meckel   Labs: Basic Metabolic Panel: Recent Labs  Lab 07/22/19 1425 07/23/19 0500 07/24/19 0353  NA 133* 130* 130*  K 3.4* 3.7 4.9  CL 92* 91* 89*  CO2 29 26 25   GLUCOSE 120* 158* 75  BUN 14 24* 33*  CREATININE 5.12* 6.50* 8.39*  CALCIUM 8.4* 8.9 8.9   Liver Function Tests: Recent Labs  Lab 07/22/19 1425  AST 18  ALT 14  ALKPHOS 112  BILITOT 0.6  PROT 7.8  ALBUMIN  2.7*   No results for input(s): LIPASE, AMYLASE in the last 168 hours. No results for input(s): AMMONIA in the last 168 hours. CBC: Recent Labs  Lab 07/22/19 1430 07/23/19 0500 07/24/19 0353  WBC 12.9* 11.1* 12.7*  HGB 9.8* 9.0* 8.7*  HCT 32.3* 29.9* 29.3*  MCV 84.8 85.4 86.2  PLT 194 207 243   Cardiac Enzymes: No results for input(s): CKTOTAL, CKMB, CKMBINDEX, TROPONINI in the last 168 hours. CBG: Recent Labs  Lab 07/23/19 1215 07/23/19 1642 07/23/19 2109 07/24/19 0619 07/24/19 0643  GLUCAP 119* 153* 217* 59* 68*    Iron Studies: No results for input(s): IRON, TIBC, TRANSFERRIN, FERRITIN in the last 72 hours. Studies/Results: Ir Fluoro Guide Cv Line Right  Result Date: 07/22/2019 INDICATION: Poor venous access. In need of durable intravenous access for medication administration during impending surgery. EXAM: ULTRASOUND AND FLUOROSCOPIC GUIDED PICC LINE INSERTION MEDICATIONS: None. CONTRAST:  None FLUOROSCOPY TIME:  18 seconds (1 mGy) COMPLICATIONS: None immediate. TECHNIQUE: The procedure, risks, benefits, and alternatives were explained to the patient and  informed written consent was obtained. A timeout was performed prior to the initiation of the procedure. The right neck and upper chest was prepped with chlorhexidine in a sterile fashion, and a sterile drape was applied covering the operative field. Maximum barrier sterile technique with sterile gowns and gloves were used for the procedure. A timeout was performed prior to the initiation of the procedure. Local anesthesia was provided with 1% lidocaine. Under direct ultrasound guidance, the right internal jugular vein was accessed with a micropuncture kit after the overlying soft tissues were anesthetized with 1% lidocaine. After the overlying soft tissues were anesthetized, a small venotomy incision was created and a micropuncture kit was utilized to access the right internal jugular vein. Real-time ultrasound guidance was  utilized for vascular access including the acquisition of a permanent ultrasound image documenting patency of the accessed vessel. A guidewire was advanced to the level of the superior caval-atrial junction for measurement purposes and the central venous catheter was cut to length. A peel-away sheath was placed and a 14 cm, 5 [place], dual lumen was inserted to level of the superior caval-atrial junction. A post procedure spot fluoroscopic was obtained. The catheter easily aspirated and flushed and was sutured in place. A dressing was placed. The patient tolerated the procedure well without immediate post procedural complication. FINDINGS: After catheter placement, the tip lies within the superior cavoatrial junction. The catheter aspirates and flushes normally and is ready for immediate use. IMPRESSION: Successful ultrasound and fluoroscopic guided placement of a right internal jugular vein approach, 14 cm, 5 [place], dual lumen non tunneled central venous catheter with tip at the superior caval-atrial junction. The central venous catheter is ready for immediate use. Electronically Signed   By: Sandi Mariscal M.D.   On: 07/22/2019 13:43   Ir US Guide Vasc Access Right  Result Date: 07/22/2019 INDICATION: Poor venous access. In need of durable intravenous access for medication administration during impending surgery. EXAM: ULTRASOUND AND FLUOROSCOPIC GUIDED PICC LINE INSERTION MEDICATIONS: None. CONTRAST:  None FLUOROSCOPY TIME:  18 seconds (1 mGy) COMPLICATIONS: None immediate. TECHNIQUE: The procedure, risks, benefits, and alternatives were explained to the patient and informed written consent was obtained. A timeout was performed prior to the initiation of the procedure. The right neck and upper chest was prepped with chlorhexidine in a sterile fashion, and a sterile drape was applied covering the operative field. Maximum barrier sterile technique with sterile gowns and gloves were used for the procedure. A  timeout was performed prior to the initiation of the procedure. Local anesthesia was provided with 1% lidocaine. Under direct ultrasound guidance, the right internal jugular vein was accessed with a micropuncture kit after the overlying soft tissues were anesthetized with 1% lidocaine. After the overlying soft tissues were anesthetized, a small venotomy incision was created and a micropuncture kit was utilized to access the right internal jugular vein. Real-time ultrasound guidance was utilized for vascular access including the acquisition of a permanent ultrasound image documenting patency of the accessed vessel. A guidewire was advanced to the level of the superior caval-atrial junction for measurement purposes and the central venous catheter was cut to length. A peel-away sheath was placed and a 14 cm, 5 [place], dual lumen was inserted to level of the superior caval-atrial junction. A post procedure spot fluoroscopic was obtained. The catheter easily aspirated and flushed and was sutured in place. A dressing was placed. The patient tolerated the procedure well without immediate post procedural complication. FINDINGS: After catheter placement, the tip lies  within the superior cavoatrial junction. The catheter aspirates and flushes normally and is ready for immediate use. IMPRESSION: Successful ultrasound and fluoroscopic guided placement of a right internal jugular vein approach, 14 cm, 5 [place], dual lumen non tunneled central venous catheter with tip at the superior caval-atrial junction. The central venous catheter is ready for immediate use. Electronically Signed   By: Sandi Mariscal M.D.   On: 07/22/2019 13:43   Dg Chest Port 1 View  Result Date: 07/22/2019 CLINICAL DATA:  Preop scheduled AKA history of hypertension coronary artery disease EXAM: PORTABLE CHEST 1 VIEW COMPARISON:  05/28/2019 FINDINGS: Post sternotomy changes. LVAD similar in position. Left-sided pacing device as before. Right central venous  catheter tip over the mid right atrium. Left jugular venous catheter tip over the SVC. Low lung volumes. Minimal atelectasis or scar right base. No consolidation, pleural effusion or pneumothorax. Coronary stents. IMPRESSION: No active disease. Minimal scarring or atelectasis at the right base. Electronically Signed   By: Donavan Foil M.D.   On: 07/22/2019 17:03   Medications: Infusions: . sodium chloride    . ferric gluconate (FERRLECIT/NULECIT) IV    . magnesium sulfate bolus IVPB      Scheduled Medications: . aspirin EC  81 mg Oral Daily  . atorvastatin  40 mg Oral q1800  . calcitRIOL  0.5 mcg Oral Q M,W,F-HD  . Chlorhexidine Gluconate Cloth  6 each Topical Daily  . citalopram  20 mg Oral Daily  . docusate sodium  100 mg Oral Daily  . dronabinol  2.5 mg Oral BID AC  . erythromycin  250 mg Oral TID  . fentaNYL   Intravenous Q4H  . gabapentin  300 mg Oral BID  . heparin  5,000 Units Subcutaneous Q8H  . hydrALAZINE  25 mg Oral 3 times per day on Sun Tue Thu Sat  . influenza vac split quadrivalent PF  0.5 mL Intramuscular Tomorrow-1000  . insulin aspart  0-9 Units Subcutaneous TID WC  . insulin glargine  10 Units Subcutaneous QHS  . metoCLOPramide  10 mg Oral TID WC  . midodrine  5 mg Oral Q M,W,F  . multivitamin  1 tablet Oral Daily  . pantoprazole  40 mg Oral BID AC  . pneumococcal 23 valent vaccine  0.5 mL Intramuscular Tomorrow-1000  . sevelamer carbonate  0.8 g Oral TID WC  . sodium chloride flush  10-40 mL Intracatheter Q12H  . sodium chloride flush  3 mL Intravenous Q12H    have reviewed scheduled and prn medications.  Physical Exam: General: face puffy--- NAD Heart: LVAD Lungs: mostly clear- is on [place] Abdomen: soft, non tender  Extremities: post op Dialysis Access: left TDC     07/24/2019,9:25 AM  LOS: 2 days

## 2019-07-24 NOTE — Progress Notes (Addendum)
Advanced Heart Failure Rounding Note  PCP-Cardiologist: Dr. Aundra Dubin   Subjective:    S/p left AKA. POD#1. 6/10 stump pain. Has PCA pump.   Currently getting HD. No cardiac complaints.   LVAD interrogation HM3: currently on battery while at HD   Objective:   Weight Range: 80.3 kg Body mass index is 26.92 kg/m.   Vital Signs:   Temp:  [98.3 F (36.8 C)-99.7 F (37.6 C)] 99.6 F (37.6 C) (11/18 0335) Pulse Rate:  [80-98] 91 (11/17 2036) Resp:  [8-24] 18 (11/18 0342) BP: (90-139)/(68-113) 93/75 (11/18 0335) SpO2:  [95 %-100 %] 95 % (11/18 0335) Weight:  [80.3 kg] 80.3 kg (11/18 0500)    Weight change: Filed Weights   07/22/19 1332 07/24/19 0500  Weight: 85.6 kg 80.3 kg    Intake/Output:   Intake/Output Summary (Last 24 hours) at 07/24/2019 0805 Last data filed at 07/23/2019 0926 Gross per 24 hour  Intake 600 ml  Output 100 ml  Net 500 ml      Physical Exam    PHYSICAL EXAM: General:  Chronically ill appearing. No respiratory difficulty HEENT: normal Neck: supple. no JVD. Carotids 2+ bilat; no bruits. No lymphadenopathy or thyromegaly appreciated. Cor: LVAD hum  Lungs: clear Abdomen: soft, nontender, nondistended. No hepatosplenomegaly. No bruits or masses. Good bowel sounds. Extremities: no cyanosis, clubbing, rash, left AKA dressing intact, remote rt BKA Neuro: alert & oriented x 3, cranial nerves grossly intact. moves all 4 extremities w/o difficulty. Affect pleasant.   Telemetry   NSR 80s-90s  EKG    No new EKG to review today   Labs    CBC Recent Labs    07/23/19 0500 07/24/19 0353  WBC 11.1* 12.7*  HGB 9.0* 8.7*  HCT 29.9* 29.3*  MCV 85.4 86.2  PLT 207 956   Basic Metabolic Panel Recent Labs    07/23/19 0500 07/24/19 0353  NA 130* 130*  K 3.7 4.9  CL 91* 89*  CO2 26 25  GLUCOSE 158* 75  BUN 24* 33*  CREATININE 6.50* 8.39*  CALCIUM 8.9 8.9   Liver Function Tests Recent Labs    07/22/19 1425  AST 18  ALT 14   ALKPHOS 112  BILITOT 0.6  PROT 7.8  ALBUMIN 2.7*   No results for input(s): LIPASE, AMYLASE in the last 72 hours. Cardiac Enzymes No results for input(s): CKTOTAL, CKMB, CKMBINDEX, TROPONINI in the last 72 hours.  BNP: BNP (last 3 results) No results for input(s): BNP in the last 8760 hours.  ProBNP (last 3 results) No results for input(s): PROBNP in the last 8760 hours.   D-Dimer No results for input(s): DDIMER in the last 72 hours. Hemoglobin A1C No results for input(s): HGBA1C in the last 72 hours. Fasting Lipid Panel No results for input(s): CHOL, HDL, LDLCALC, TRIG, CHOLHDL, LDLDIRECT in the last 72 hours. Thyroid Function Tests No results for input(s): TSH, T4TOTAL, T3FREE, THYROIDAB in the last 72 hours.  Invalid input(s): FREET3  Other results:   Imaging    No results found.   Medications:     Scheduled Medications: . aspirin EC  81 mg Oral Daily  . atorvastatin  40 mg Oral q1800  . calcitRIOL  0.5 mcg Oral Q M,W,F-HD  . Chlorhexidine Gluconate Cloth  6 each Topical Daily  . citalopram  20 mg Oral Daily  . docusate sodium  100 mg Oral Daily  . dronabinol  2.5 mg Oral BID AC  . erythromycin  250 mg Oral TID  .  fentaNYL   Intravenous Q4H  . gabapentin  300 mg Oral BID  . heparin  5,000 Units Subcutaneous Q8H  . hydrALAZINE  25 mg Oral 3 times per day on Sun Tue Thu Sat  . influenza vac split quadrivalent PF  0.5 mL Intramuscular Tomorrow-1000  . insulin aspart  0-9 Units Subcutaneous TID WC  . insulin glargine  10 Units Subcutaneous QHS  . metoCLOPramide  10 mg Oral TID WC  . midodrine  5 mg Oral Q M,W,F  . multivitamin  1 tablet Oral Daily  . pantoprazole  40 mg Oral BID AC  . pneumococcal 23 valent vaccine  0.5 mL Intramuscular Tomorrow-1000  . sevelamer carbonate  0.8 g Oral TID WC  . sodium chloride flush  3 mL Intravenous Q12H    Infusions: . sodium chloride    . ferric gluconate (FERRLECIT/NULECIT) IV    . magnesium sulfate bolus IVPB       PRN Medications: sodium chloride, acetaminophen **OR** acetaminophen, diphenhydrAMINE **OR** diphenhydrAMINE, docusate sodium, guaiFENesin-dextromethorphan, hydrALAZINE, labetalol, lidocaine, LORazepam, LORazepam, magnesium sulfate bolus IVPB, metoprolol tartrate, morphine injection, naloxone **AND** sodium chloride flush, ondansetron (ZOFRAN) IV, oxyCODONE-acetaminophen, phenol, potassium chloride, promethazine, promethazine, sodium chloride flush    Patient Profile   [name] is a 53 year old withCAD s/p anterior MI in 2010 and NSTEMI in 3/18 with DES to pRCA and mLAD, gastroparesis,ischemic cardiomyopathy now s/p Heartmate 3 LVAD and ESRD on HD, factor V leiden, PAF, and severe bilateral PAD s/p amputation of the 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 Now being readmitted for planned left AKA.  Assessment/Plan   1. Chronic Systolic CHF s/p HM3 LVAD: Ischemic cardiomyopathy. St Jude ICD. NSTEMI in March 2018 with DES to LAD and RCA, complicated by cardiogenic shock, low output requiring milrinone. Echo 8/18 with EF 20-25%, moderate [name]. CPX (8/18) with severe functional impairment due to HF. He is s/p HM3 LVAD placement. He had pump thrombosis 6/19 due to [facility] outflow graft kinking. He had pump exchange for another HM3.Not a candidate for transplant with PAD.Volume status managed at HD.  - INR 1.2 today. Coumadin and ASA both held for AKA. Plan to start warfarin and heparin bridge once cleared by VVS,. Hopefully can resume tonight.  - LDH  179>>183   2. 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 Left foot failed to improved.  - s/p left AKA this admit, POD #1.  - post op management per VVS  3. ESRD - HD WMF. Nephrology following  - renal diet   4.CAD: NSTEMI in 3/18. LHC with 99% ulcerated lesion proximal RCA with left to right collaterals, 95% mid LAD stenosis after mid LAD stent. s/p PCI to RCA and LAD on  11/25/16.Denies chest pain.  - Continue statin - plan to resume asa 81 mg    5. H/o DVTs: Factor V Leiden heterozygote. -Plan to resume coumadin w/ heparin bridge once cleared by VVS  6. Diabetic gastroparesis: Long history of this. - on reglan 10 mg tid/ac and Marinol as outpatient  - Continue Protonix bid.  - He will continue erythromycinindefinitely.  - Limit narcotic use as much as possible.  7. HTN: has been controlled, but a bit low today at HD. - Continue hydralazine 25 mg tid on non-HD days.  - Requires midodrine w/ HD.  - Monitor   8. Lower Extremity Pain:  - PCA pump for post op pain  9. Hyponatremia: Na 130. Chronically low.  -monitor  10. T2DM:  -continue home insulin regimen   Length of Stay: 2  [name] Jester, PA-C  07/24/2019, 8:05 AM  Advanced Heart Failure Team Pager [phone] (M-F; 7a - 4p)  Please contact [facility] for night-coverage after hours (4p -7a ) and weekends on [email or web address]  Patient seen with PA, agree with the above note.    LVAD parameters stable.    He is now s/p left AKA, stable today with pain controlled on Fentanyl PCA.  He had HD today with no problems.   Discussed with VVS (PA), ok to restart heparin gtt + warfarin bridge to therapeutic INR today.   Will cut back on Fentanyl PCA as he is a bit over-sedated.   PT/OT  [name] 07/24/2019 2:49 PM

## 2019-07-24 NOTE — Progress Notes (Signed)
[facility]  Subjective  - pain overnight   Objective 93/75 91 99.6 F (37.6 C) (Oral) 18 95%  Intake/Output Summary (Last 24 hours) at 07/24/2019 0835 Last data filed at 07/23/2019 0926 Gross per 24 hour  Intake 600 ml  Output 100 ml  Net 500 ml   AKA dressing left leg intact  Assessment/Planning: POD #1 left AKA Change dressing tomorrow D/c home when pain controlled Warfarin per [place] [place] 07/24/2019 8:35 AM --  Laboratory Lab Results: Recent Labs    07/23/19 0500 07/24/19 0353  WBC 11.1* 12.7*  HGB 9.0* 8.7*  HCT 29.9* 29.3*  PLT 207 243   BMET Recent Labs    07/23/19 0500 07/24/19 0353  NA 130* 130*  K 3.7 4.9  CL 91* 89*  CO2 26 25  GLUCOSE 158* 75  BUN 24* 33*  CREATININE 6.50* 8.39*  CALCIUM 8.9 8.9    COAG Lab Results  Component Value Date   INR 1.2 07/24/2019   INR 1.1 07/23/2019   INR 1.2 07/22/2019   No results found for: PTT

## 2019-07-24 NOTE — Progress Notes (Signed)
[name] for Warfarin Indication: LVAD  Allergies  Allergen Reactions  . Metformin And Related Diarrhea    Patient Measurements: Height: 5' 8"  (172.7 cm) Weight: 172 lb 13.5 oz (78.4 kg) IBW/kg (Calculated) : 68.4 Heparin Dosing Weight: 82 kg  Vital Signs: Temp: 98.2 F (36.8 C) (11/18 1150) Temp Source: Oral (11/18 1150) BP: 78/59 (11/18 1526) Pulse Rate: 67 (11/18 1150)  Labs: Recent Labs    07/22/19 1425  07/22/19 1430 07/22/19 2330 07/23/19 0500 07/24/19 0353  HGB  --    < > 9.8*  --  9.0* 8.7*  HCT  --   --  32.3*  --  29.9* 29.3*  PLT  --   --  194  --  207 243  LABPROT 14.8  --   --   --  13.9 14.7  INR 1.2  --   --   --  1.1 1.2  HEPARINUNFRC  --   --   --  <0.10* <0.10* <0.10*  CREATININE 5.12*  --   --   --  6.50* 8.39*   < > = values in this interval not displayed.    Estimated Creatinine Clearance: 9.7 mL/min (A) (by C-G formula based on SCr of 8.39 mg/dL (H)).   Medical History: Past Medical History:  Diagnosis Date  . Angina   . ASCVD (arteriosclerotic cardiovascular disease)    , Anterior infarction 2005, LAD diagonal bifurcation intervention 03/2004  . Automatic implantable cardiac defibrillator -St. Jude's       . Benign neoplasm of colon   . CHF (congestive heart failure) ([place])   . Chronic systolic heart failure ([place])   . Coronary artery disease     Widely patent previously placed stents in the left anterior   . Crohn's disease ([place])   . Deep venous thrombosis ([facility])    Recurrent-on Coumadin  . Dialysis patient ([place])   . Dyspnea   . Gastroparesis   . GERD (gastroesophageal reflux disease)   . High cholesterol   . Hyperlipidemia   . Hypersomnolent    Previous diagnosis of narcolepsy  . Hypertension, essential   . Ischemic cardiomyopathy    Ejection fraction 15-20% catheterization 2010  . Type II or unspecified type diabetes mellitus without mention of complication, not stated as uncontrolled    . Unspecified gastritis and gastroduodenitis without mention of hemorrhage     Medications:  Infusions:  . sodium chloride    . ferric gluconate (FERRLECIT/NULECIT) IV 62.5 mg (07/24/19 1458)  . magnesium sulfate bolus IVPB      Assessment: 52 yo male with LVAD, on chronic Coumadin admitted for L AKA.   INR down to 1.2 after holding Coumadin for several days in anticipation of procedure.  CBC okay.  Is now s/p AKA. Resume Coumadin  (11/18). Will discuss resuming heparin drip in am as Hx Factor Leiden deficency   Goal of Therapy:  Heparin level 0.3-0.4 preop INR 2-3 Monitor platelets by anticoagulation protocol: Yes   Plan:  - Warfarin 26m x1  Daily Protime  LBonnita NasutiPharm.D. CPP, BCPS Clinical Pharmacist [phone]/18/2020 3:28 PM

## 2019-07-24 NOTE — Progress Notes (Signed)
Inpatient Rehab Admissions:  Inpatient Rehab Consult received. Pt is POD 1 from L AKA.  Awaiting PT/OT evaluations.   Signed: Shann Medal, PT, DPT Admissions Coordinator [phone] 07/24/19  8:54 AM

## 2019-07-24 NOTE — Plan of Care (Addendum)
  Problem: Education: Goal: Patient will understand all VAD equipment and how it functions Outcome: Completed/Met   Problem: Cardiac: Goal: LVAD will function as expected and patient will experience no clinical alarms Outcome: Completed/Met   Problem: Education: Goal: Knowledge of the prescribed therapeutic regimen will improve Outcome: Progressing Goal: Ability to verbalize activity precautions or restrictions will improve Outcome: Progressing Goal: Understanding of discharge needs will improve Outcome: Progressing   Problem: Activity: Goal: Ability to perform//tolerate increased activity and mobilize with assistive devices will improve Outcome: Progressing   VTE: SQ heparin + coumadin. Pt AOx4, narcan given x2 this afternoon, RR 5-7, RASS -3. BP 80sys, 277m bolus given per order. HF/VAD team notified. PCA changed to reduced dose, RN monitored closely. 1800, pt requesting pain management, declined morphine IVP-states makes him itch. Not time for PO opiate. Decline tylenol. Reduced dose PCA fentanyl given x1 with close monitoring. Pt requesting advancing diet from clears.

## 2019-07-24 NOTE — Progress Notes (Addendum)
Utilized Narcan for RASS -3, RR 7. RN at bedside. Pt RR now 14. RASS -1. PCA changed to reduced dose.  BP 78/59 (66), doppler 72--paged HF team. Pt AOx4, talking on phone with family. Now complaining of LLE pain. Serial VS. PCA paused.   [place] NP returned call, will discuss with Dr. Aundra Dubin and place orders if needed.    Physician notified: VAD coord. Text page. At: 1645  Regarding: FYI gave second dose of narcan for RR 6, RASS -3. Pt now awake, c/o throbbing LLE pain. BP up to 103/67 (79) after 174m NS bolus per previous order.  Awaiting return response. RN turned off PCA. Will continue to monitor.   SJudson Roch VDuncombereturned call. Continue to monitor at this time. Hold PCA.

## 2019-07-24 NOTE — Progress Notes (Addendum)
LVAD Coordinator Rounding Note:  Admitted 07/22/19 per Dr. Aundra Dubin for heparin bridge for left AKA.    HM III LVAD implanted on 06/12/17 by Dr. Darcey Nora under Destination Therapy criteria. Pump exchange performed on 03/01/18 per Dr. Darcey Nora for pump thrombus.   Pt had HD this am. Lying in bed, drowsy, c/o pain, using PCA pump. Very talkative this afternoon, long conversation about life as double amputee. Offered to contact support group for him, but he says he does not want to get prosthetics and/or learn to walk with them. He says he wants to be "comfortable" and has a "few things he wants to do in life". He admits to being "too tired" to put forth much physical effort to care for himself. He plans on his son taking total care of him, "as he has been doing" when he returns home. Is adamant he does not want to go to [facility] post discharge. Will ask Raquel Sarna, LCSW to speak with patient. Palliative care consult ordered per Dr. Claris Gladden request.   Offered to contact amputee support group if patient changes his mind. Informed him it could be for emotional support as well. He agreed to let us know if he wants to meet with someone.   Vital signs: Temp: 98.2 HR: 100 Doppler Pressure:  88 Automatic BP:  93/74 (84) O2 Sat: 100 % RA  Wt: 188.7>177 lbs   LVAD interrogation reveals:  Speed: 5500 Flow: 4.2 Power: 4.5w PI: 3.4 Alarms: none Events: 4 PI events 07/23/19 Hematocrit: 29  Fixed speed: 5500 Low speed limit: 5200   Drive Line: Dressing C/D/I; anchor intact and accurately applied. Weekly dressing changes per bedside nurse; next dressing change due 07/28/19.  Labs: LDH trend: 183  INR trend: 1.2   Anticoagulation Plan: -INR Goal: 2.0 - 3.0 -ASA Dose: 81 mg daily   Device: St Jude dual ICD - Therapies: on 200 bpm - Last check: 01/01/18 - 2 hrs Afl/Afib; EMI 04/02/18   Plan/Recommendations:  1. Call VAD pager if any questions about VAD equipment or drive line. 2. Social  worker to see patient today. 3. Palliative care consult ordered per Dr. Claris Gladden request.    Zada Girt RN [place] Coordinator  Office: [phone]  24/7 Pager: [phone]

## 2019-07-24 NOTE — Progress Notes (Signed)
OT Cancellation Note  Patient Details Name: [name] MRN: [id number] DOB: [birth year]   Cancelled Treatment:    Reason Eval/Treat Not Completed: Patient at procedure or test/ unavailable(HD)  Malka So 07/24/2019, 9:17 AM  [name], OTR/L Acute Rehabilitation Services Pager: [phone] Office: [phone]

## 2019-07-25 DIAGNOSIS — Z7189 Other specified counseling: Secondary | ICD-10-CM

## 2019-07-25 DIAGNOSIS — Z515 Encounter for palliative care: Secondary | ICD-10-CM

## 2019-07-25 DIAGNOSIS — Z992 Dependence on renal dialysis: Secondary | ICD-10-CM

## 2019-07-25 DIAGNOSIS — N186 End stage renal disease: Secondary | ICD-10-CM

## 2019-07-25 LAB — HEPARIN LEVEL (UNFRACTIONATED): Heparin Unfractionated: 0.22 IU/mL — ABNORMAL LOW (ref 0.30–0.70)

## 2019-07-25 LAB — BASIC METABOLIC PANEL
Anion gap: 10 (ref 5–15)
BUN: 15 mg/dL (ref 6–20)
CO2: 26 mmol/L (ref 22–32)
Calcium: 8.4 mg/dL — ABNORMAL LOW (ref 8.9–10.3)
Chloride: 97 mmol/L — ABNORMAL LOW (ref 98–111)
Creatinine, Ser: 5.33 mg/dL — ABNORMAL HIGH (ref 0.61–1.24)
GFR calc Af Amer: 13 mL/min — ABNORMAL LOW (ref >60)
GFR calc non Af Amer: 11 mL/min — ABNORMAL LOW (ref >60)
Glucose, Bld: 95 mg/dL (ref 70–99)
Potassium: 4 mmol/L (ref 3.5–5.1)
Sodium: 133 mmol/L — ABNORMAL LOW (ref 135–145)

## 2019-07-25 LAB — GLUCOSE, CAPILLARY
Glucose-Capillary: 87 mg/dL (ref 70–99)
Glucose-Capillary: 182 mg/dL — ABNORMAL HIGH (ref 70–99)
Glucose-Capillary: 162 mg/dL — ABNORMAL HIGH (ref 70–99)
Glucose-Capillary: 124 mg/dL — ABNORMAL HIGH (ref 70–99)

## 2019-07-25 LAB — SURGICAL PATHOLOGY

## 2019-07-25 LAB — CBC
HCT: 26.9 % — ABNORMAL LOW (ref 39.0–52.0)
Hemoglobin: 7.9 g/dL — ABNORMAL LOW (ref 13.0–17.0)
MCH: 26 pg (ref 26.0–34.0)
MCHC: 29.4 g/dL — ABNORMAL LOW (ref 30.0–36.0)
MCV: 88.5 fL (ref 80.0–100.0)
Platelets: 193 10*3/uL (ref 150–400)
RBC: 3.04 MIL/uL — ABNORMAL LOW (ref 4.22–5.81)
RDW: 17.4 % — ABNORMAL HIGH (ref 11.5–15.5)
WBC: 12.1 10*3/uL — ABNORMAL HIGH (ref 4.0–10.5)
nRBC: 0 % (ref 0.0–0.2)

## 2019-07-25 LAB — HEMOGLOBIN A1C
Hgb A1c MFr Bld: 6 % — ABNORMAL HIGH (ref 4.8–5.6)
Mean Plasma Glucose: 125.5 mg/dL

## 2019-07-25 LAB — PROTIME-INR
INR: 1.2 (ref 0.8–1.2)
Prothrombin Time: 14.9 seconds (ref 11.4–15.2)

## 2019-07-25 LAB — LACTATE DEHYDROGENASE: LDH: 160 U/L (ref 98–192)

## 2019-07-25 MED ORDER — WARFARIN SODIUM 5 MG PO TABS
5.0000 mg | ORAL_TABLET | Freq: Once | ORAL | Status: AC
  Administered 2019-07-25: 5 mg via ORAL
  Filled 2019-07-25: qty 1

## 2019-07-25 MED ORDER — CHLORHEXIDINE GLUCONATE CLOTH 2 % EX PADS
6.0000 | MEDICATED_PAD | Freq: Every day | CUTANEOUS | Status: DC
  Administered 2019-07-26 – 2019-07-29 (×4): 6 via TOPICAL

## 2019-07-25 MED ORDER — FENTANYL CITRATE (PF) 100 MCG/2ML IJ SOLN
INTRAMUSCULAR | Status: AC
  Filled 2019-07-25: qty 2

## 2019-07-25 MED ORDER — HEPARIN (PORCINE) 25000 UT/250ML-% IV SOLN
1350.0000 [IU]/h | INTRAVENOUS | Status: DC
  Administered 2019-07-25: 1100 [IU]/h via INTRAVENOUS
  Administered 2019-07-27: 1350 [IU]/h via INTRAVENOUS
  Filled 2019-07-25 (×5): qty 250

## 2019-07-25 MED ORDER — FENTANYL CITRATE (PF) 100 MCG/2ML IJ SOLN
25.0000 ug | INTRAMUSCULAR | Status: DC | PRN
  Administered 2019-07-25: 12.5 ug via INTRAVENOUS
  Administered 2019-07-25 (×3): 25 ug via INTRAVENOUS
  Administered 2019-07-26: 12.5 ug via INTRAVENOUS
  Filled 2019-07-25 (×2): qty 2

## 2019-07-25 MED ORDER — FENTANYL CITRATE (PF) 100 MCG/2ML IJ SOLN
INTRAMUSCULAR | Status: AC
  Administered 2019-07-25: 25 ug via INTRAVENOUS
  Filled 2019-07-25: qty 2

## 2019-07-25 MED ORDER — INSULIN ASPART 100 UNIT/ML ~~LOC~~ SOLN
0.0000 [IU] | Freq: Three times a day (TID) | SUBCUTANEOUS | Status: DC
  Administered 2019-07-25 – 2019-07-27 (×4): 1 [IU] via SUBCUTANEOUS
  Administered 2019-07-27: 2 [IU] via SUBCUTANEOUS
  Administered 2019-07-27: 3 [IU] via SUBCUTANEOUS
  Administered 2019-07-28 – 2019-07-29 (×3): 1 [IU] via SUBCUTANEOUS

## 2019-07-25 MED ORDER — INSULIN GLARGINE 100 UNIT/ML ~~LOC~~ SOLN
8.0000 [IU] | Freq: Every day | SUBCUTANEOUS | Status: DC
  Administered 2019-07-25 – 2019-07-29 (×5): 8 [IU] via SUBCUTANEOUS
  Filled 2019-07-25 (×7): qty 0.08

## 2019-07-25 NOTE — Evaluation (Signed)
Occupational Therapy Evaluation Patient Details Name: [name] MRN: [id number] DOB: [birth year] Today's Date: 07/25/2019    History of Present Illness 53 y.o. 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 Pt underwent L AKA on 07/23/2019.   Clinical Impression   Pt typically at home with 24/7 supervision from family, has transfer board and gets assist for ADL from adult children. Today he was min A for transfer board back into bed (leading with R side for pain management with new amputation on L). He was max A for peri care at bed level. OT will follow acutely to develop LB ADL skills and compensatory strategies potentially with AE.     Follow Up Recommendations  No OT follow up;Supervision/Assistance - 24 hour    Equipment Recommendations  None recommended by OT(Pt has appropriate DME)    Recommendations for Other Services       Precautions / Restrictions Precautions Precautions: Fall;Other (comment) Precaution Comments: LVAD, B AKA Restrictions Weight Bearing Restrictions: Yes RLE Weight Bearing: Non weight bearing LLE Weight Bearing: Non weight bearing      Mobility Bed Mobility Overal bed mobility: Needs Assistance Bed Mobility: Sit to Supine     Supine to sit: Supervision;HOB elevated        Transfers Overall transfer level: Needs assistance Equipment used: Sliding board Transfers: Lateral/Scoot Transfers          Lateral/Scoot Transfers: Min assist General transfer comment: assistance to place sliding board, MIn A with bed pad underneath for friction reduction as Pt was going "uphill" from recliner to bed    Balance Overall balance assessment: Needs assistance Sitting-balance support: Bilateral upper extremity supported Sitting balance-[name]: Good Sitting balance - Comments: supervision                                    ADL either performed or assessed with clinical judgement   ADL Overall ADL's : Needs assistance/impaired Eating/Feeding: Independent;Sitting   Grooming: Set up;Sitting   Upper Body Bathing: Set up;Sitting   Lower Body Bathing: Moderate assistance;Bed level   Upper Body Dressing : Set up;Sitting   Lower Body Dressing: Maximal assistance;Bed level;Sitting/lateral leans   Toilet Transfer: Minimal assistance;Transfer board   Toileting- Clothing Manipulation and Hygiene: Maximal assistance;Bed level       Functional mobility during ADLs: Minimal assistance(sliding board)       Vision         Perception     Praxis      Pertinent Vitals/Pain Pain Assessment: Faces Faces Pain Scale: Hurts a little bit Pain Location: L AKA Pain Descriptors / Indicators: Sore;Discomfort Pain Intervention(s): Monitored during session;Repositioned     Hand Dominance Right   Extremity/Trunk Assessment Upper Extremity Assessment Upper Extremity Assessment: Overall WFL for tasks assessed   Lower Extremity Assessment Lower Extremity Assessment: RLE deficits/detail;LLE deficits/detail RLE Deficits / Details: R AKA, hip ROM WFL LLE Deficits / Details: L AKA, Hip ROM WFL   Cervical / Trunk Assessment Cervical / Trunk Assessment: Normal   Communication Communication Communication: No difficulties   Cognition Arousal/Alertness: Awake/alert Behavior During Therapy: WFL for tasks assessed/performed;Flat affect Overall Cognitive Status: Within Functional Limits for tasks assessed  General Comments  VSS, pt on room air    Exercises     Shoulder Instructions      Home Living Family/patient expects to be discharged to:: Private residence Living Arrangements: Children Available Help at Discharge: Family;Available 24 hours/day Type of Home: House Home Access: Stairs to enter [employer] of Steps:  2 Entrance Stairs-Rails: None Home Layout: Multi-level;Able to live on main level with bedroom/bathroom     Bathroom Shower/Tub: Teacher, early years/pre: Standard Bathroom Accessibility: Yes   Home Equipment: Cane - single point;Walker - 4 wheels;Walker - 2 wheels;Wheelchair - manual   Additional Comments: pt lives with son and daughter, one of which is with him all the time. His bedroom is downstairs but the kids have moved him to the main level so he will only have to do 2 STE  Lives With: Son;Daughter    Prior Functioning/Environment Level of Independence: Needs assistance  Gait / Transfers Assistance Needed: pt reports requiring assistance for all mobility, with some needed for bed mobility, transfers, and wheelchair mobility. Pt performs slide board transfers from bed to wheelchair and is non-ambulatory.              OT Problem List: Decreased strength;Decreased range of motion;Decreased activity tolerance;Impaired balance (sitting and/or standing);Decreased knowledge of use of DME or AE;Decreased knowledge of precautions;Pain      OT Treatment/Interventions: Self-care/ADL training;Therapeutic exercise;Energy conservation;DME and/or AE instruction;Therapeutic activities;Patient/family education    OT Goals(Current goals can be found in the care plan section) Acute Rehab OT Goals Patient Stated Goal: to go home  OT Goal Formulation: With patient Time For Goal Achievement: 08/08/19 Potential to Achieve Goals: Good ADL Goals Pt Will Perform Lower Body Bathing: with min assist;sitting/lateral leans;with adaptive equipment Pt Will Perform Lower Body Dressing: with min assist;sitting/lateral leans Pt Will Transfer to Toilet: with supervision;with transfer board;bedside commode Pt Will Perform Toileting - Clothing Manipulation and hygiene: with min guard assist;sitting/lateral leans  OT Frequency: Min 2X/week   Barriers to D/C:            Co-evaluation               AM-PAC OT "6 Clicks" Daily Activity     Outcome Measure Help from another person eating meals?: None Help from another person taking care of personal grooming?: A Little Help from another person toileting, which includes using toliet, bedpan, or urinal?: A Lot Help from another person bathing (including washing, rinsing, drying)?: A Lot Help from another person to put on and taking off regular upper body clothing?: A Little Help from another person to put on and taking off regular lower body clothing?: A Lot 6 Click Score: 16   End of Session Equipment Utilized During Treatment: Gait belt Nurse Communication: Mobility status  Activity Tolerance: Patient tolerated treatment well Patient left: in bed;with call bell/phone within reach  OT Visit Diagnosis: Other abnormalities of gait and mobility (R26.89);Muscle weakness (generalized) (M62.81);Pain Pain - Right/Left: Right Pain - part of body: Leg                Time: 1201-1232 OT Time Calculation (min): 31 min Charges:  OT General Charges $OT Visit: 1 Visit OT Evaluation $OT Eval Moderate Complexity: 1 Mod OT Treatments $Self Care/Home Management : 8-22 mins  [name] OTR/L Acute Rehabilitation Services Pager: [phone] Office: [place] 07/25/2019, 1:43 PM

## 2019-07-25 NOTE — Consult Note (Signed)
PV Navigator consult acknowledged and chart reviewed. Patient sleeping at this time however was able to speak with patient's daughter who is at bedside.  Daughter reports patient is doing better today and is off of IV pain meds. Goal is for patient to be discharged home when cleared. She states patient has been living at home with her and her brother and they provide 24/7 supervision. She states they have DME equipment needed She denies any questions or barriers at this time. Palliative services following patient. I will plan to  follow acutely to answer any questions or assist with barriers should they arise. Contact information left with daughter and encouraged her to call if needed.  Thank you,  Cletis Media RN BSN CWS Grandfather [phone]

## 2019-07-25 NOTE — Progress Notes (Signed)
Subjective:  HD yest- removed almost 1 liter- tolerated well -  No c/o's this AM Objective Vital signs in last 24 hours: Vitals:   07/25/19 0500 07/25/19 0600 07/25/19 0645 07/25/19 0700  BP: 108/81 99/68 101/68 100/90  Pulse:      Resp:  12 11 13   Temp:      TempSrc:      SpO2:      Weight: 79.2 kg     Height:       Weight change: -1.9 kg  Intake/Output Summary (Last 24 hours) at 07/25/2019 0747 Last data filed at 07/24/2019 1900 Gross per 24 hour  Intake 820 ml  Output 961 ml  Net -141 ml   Dialysis Orders: Center: [facility]  on mwf . EDW 79kg HD Bath 2k, 2ca  Time 4hr Heparin 7000.  Access Lij permcath      Calcitriol 0.5 mcg po/HD  Mircera 225 mcg q 2wks (last on 07/17/19)    Venofer  50m  qwkly     Assessment/ Plan: Pt is a 53y.o. yo male with ESRD and LVAD who was admitted on 07/22/2019 with gangrene of left leg  Assessment/Plan: 1. PAD-- s/p left AKA 11/17 per [facility], now bilat AKA-  no abx-  recovering  2. ESRD - normally MWF-  HD tomorrow  on schedule via [facility]. Anemia-  Dropping with surgery - max ESA given 11/11 as OP - getting iron 4. Secondary hyperparathyroidism- on rocaltrol and renvela 5. HTN/volume- goal of only 1 liter today-  Sodium low which argues for some volume overload- also face is puffy.  Will need lower EDW at discharge.  On hydralazine and midodrine ? dont think was on hydralazine as OP -  Will stop hydralazine-  Really dont think will be able to tolerate long term and need to keep as much volume off as possible especially with bilat AKA- will try to go for 2 liters tomorrow   KLouis Meckel   Labs: Basic Metabolic Panel: Recent Labs  Lab 07/23/19 0500 07/24/19 0353 07/25/19 0500  NA 130* 130* 133*  K 3.7 4.9 4.0  CL 91* 89* 97*  CO2 26 25 26   GLUCOSE 158* 75 95  BUN 24* 33* 15  CREATININE 6.50* 8.39* 5.33*  CALCIUM 8.9 8.9 8.4*   Liver Function Tests: Recent Labs  Lab 07/22/19 1425  AST 18  ALT 14  ALKPHOS 112  BILITOT  0.6  PROT 7.8  ALBUMIN 2.7*   No results for input(s): LIPASE, AMYLASE in the last 168 hours. No results for input(s): AMMONIA in the last 168 hours. CBC: Recent Labs  Lab 07/22/19 1430 07/23/19 0500 07/24/19 0353 07/24/19 2055 07/25/19 0500  WBC 12.9* 11.1* 12.7* 10.8* 12.1*  HGB 9.8* 9.0* 8.7* 7.7* 7.9*  HCT 32.3* 29.9* 29.3* 26.4* 26.9*  MCV 84.8 85.4 86.2 86.8 88.5  PLT 194 207 243 160 193   Cardiac Enzymes: No results for input(s): CKTOTAL, CKMB, CKMBINDEX, TROPONINI in the last 168 hours. CBG: Recent Labs  Lab 07/24/19 1252 07/24/19 1501 07/24/19 1612 07/24/19 2113 07/25/19 0611  GLUCAP 67* 156* 127* 125* 87    Iron Studies: No results for input(s): IRON, TIBC, TRANSFERRIN, FERRITIN in the last 72 hours. Studies/Results: No results found. Medications: Infusions: . sodium chloride    . ferric gluconate (FERRLECIT/NULECIT) IV 62.5 mg (07/24/19 1458)  . heparin    . magnesium sulfate bolus IVPB      Scheduled Medications: . aspirin EC  81 mg Oral  Daily  . atorvastatin  40 mg Oral q1800  . calcitRIOL  0.5 mcg Oral Q M,W,F-HD  . Chlorhexidine Gluconate Cloth  6 each Topical Daily  . citalopram  20 mg Oral Daily  . docusate sodium  100 mg Oral Daily  . dronabinol  2.5 mg Oral BID AC  . erythromycin  250 mg Oral TID  . gabapentin  300 mg Oral BID  . heparin  5,000 Units Subcutaneous Q8H  . influenza vac split quadrivalent PF  0.5 mL Intramuscular Tomorrow-1000  . insulin aspart  0-9 Units Subcutaneous TID WC  . insulin glargine  10 Units Subcutaneous QHS  . metoCLOPramide  10 mg Oral TID WC  . midodrine  5 mg Oral Q M,W,F  . multivitamin  1 tablet Oral Daily  . pantoprazole  40 mg Oral BID AC  . pneumococcal 23 valent vaccine  0.5 mL Intramuscular Tomorrow-1000  . sevelamer carbonate  0.8 g Oral TID WC  . sodium chloride flush  10-40 mL Intracatheter Q12H  . sodium chloride flush  3 mL Intravenous Q12H  . Warfarin - Pharmacist Dosing Inpatient   Does  not apply q1800    have reviewed scheduled and prn medications.  Physical Exam: General: face puffy--- NAD Heart: LVAD Lungs: mostly clear- is on [place] Abdomen: soft, non tender  Extremities: post op Dialysis Access: left TDC     07/25/2019,7:47 AM  LOS: 3 days

## 2019-07-25 NOTE — Progress Notes (Signed)
[place] for Warfarin & Heparin Indication: LVAD  Allergies  Allergen Reactions  . Metformin And Related Diarrhea    Patient Measurements: Height: 5' 8"  (172.7 cm) Weight: 174 lb 9.7 oz (79.2 kg) IBW/kg (Calculated) : 68.4 Heparin Dosing Weight: 82 kg  Vital Signs: Temp: 98.4 F (36.9 C) (11/19 1603) Temp Source: Oral (11/19 1603) BP: 100/76 (11/19 1603) Pulse Rate: 85 (11/19 1712)  Labs: Recent Labs    07/23/19 0500 07/24/19 0353 07/24/19 2055 07/25/19 0500 07/25/19 1700  HGB 9.0* 8.7* 7.7* 7.9*  --   HCT 29.9* 29.3* 26.4* 26.9*  --   PLT 207 243 160 193  --   LABPROT 13.9 14.7  --  14.9  --   INR 1.1 1.2  --  1.2  --   HEPARINUNFRC <0.10* <0.10*  --   --  0.22*  CREATININE 6.50* 8.39*  --  5.33*  --     Estimated Creatinine Clearance: 15.3 mL/min (A) (by C-G formula based on SCr of 5.33 mg/dL (H)).   Medical History: Past Medical History:  Diagnosis Date  . Angina   . ASCVD (arteriosclerotic cardiovascular disease)    , Anterior infarction 2005, LAD diagonal bifurcation intervention 03/2004  . Automatic implantable cardiac defibrillator -St. Jude's       . Benign neoplasm of colon   . CHF (congestive heart failure) ([place])   . Chronic systolic heart failure ([place])   . Coronary artery disease     Widely patent previously placed stents in the left anterior   . Crohn's disease ([place])   . 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 Dialysis patient ([place])   . Dyspnea   . Gastroparesis   . GERD (gastroesophageal reflux disease)   . High cholesterol   . Hyperlipidemia   . Hypersomnolent    Previous diagnosis of narcolepsy  . Hypertension, essential   . Ischemic cardiomyopathy    Ejection fraction 15-20% catheterization 2010  . Type II or unspecified type diabetes mellitus without mention of complication, not stated as uncontrolled   . Unspecified gastritis and gastroduodenitis without mention of hemorrhage      Assessment: 53 yo male with LVAD, on chronic Coumadin admitted for L AKA. Pharmacy asked to dose coumadin and start heparin bridge until INR 1.8.  Restarted Coumadin yesterday after holding for several days in anticipation of procedure. INR unchanged today at 1.2. H/H low but stable; platelets 193.  Heparin infusion was initiated this morning at 1100 units/hr. Heparin level drawn ~8.5 hrs starting infusion was 0.22 units/ml, which is below the goal range for this pt. Per RN, no issues with IV or bleeding observed.  Goal of Therapy:  Heparin level 0.3-0.4  INR 2-3 Monitor platelets by anticoagulation protocol: Yes   Plan:  Increase heparin infusion to 1250 units/hr Check 8-hr heparin level Warfarin 5 mg PO X 2 today Monitor daily protime INR, heparin level, CBC Monitor for signs/symptoms of bleeding  Gillermina Hu, PharmD, BCPS, [facility] Clinical Pharmacist 07/25/2019  5:45 PM

## 2019-07-25 NOTE — Plan of Care (Signed)
  Problem: Education: Goal: Patient will be able to verbalize current INR target range and antiplatelet therapy for discharge home Outcome: Progressing   Problem: Education: Goal: Knowledge of the prescribed therapeutic regimen will improve Outcome: Progressing Goal: Ability to verbalize activity precautions or restrictions will improve Outcome: Progressing Goal: Understanding of discharge needs will improve Outcome: Progressing   Problem: Activity: Goal: Ability to perform//tolerate increased activity and mobilize with assistive devices will improve Outcome: Progressing   Problem: Clinical Measurements: Goal: Postoperative complications will be avoided or minimized Outcome: Progressing   Problem: Self-Care: Goal: Ability to meet self-care needs will improve Outcome: Progressing   Problem: Self-Concept: Goal: Ability to maintain and perform role responsibilities to the fullest extent possible will improve Outcome: Progressing   Problem: Pain Management: Goal: Pain level will decrease with appropriate interventions Outcome: Progressing   Problem: Skin Integrity: Goal: Demonstration of wound healing without infection will improve Outcome: Progressing   Problem: Education: Goal: Knowledge of General Education information will improve Description: Including pain rating scale, medication(s)/side effects and non-pharmacologic comfort measures Outcome: Progressing   Problem: Health Behavior/Discharge Planning: Goal: Ability to manage health-related needs will improve Outcome: Progressing   Problem: Clinical Measurements: Goal: Ability to maintain clinical measurements within normal limits will improve Outcome: Progressing Goal: Will remain free from infection Outcome: Progressing Goal: Diagnostic test results will improve Outcome: Progressing Goal: Respiratory complications will improve Outcome: Progressing Goal: Cardiovascular complication will be avoided Outcome:  Progressing   Problem: Activity: Goal: Risk for activity intolerance will decrease Outcome: Progressing   Problem: Nutrition: Goal: Adequate nutrition will be maintained Outcome: Progressing   Problem: Coping: Goal: Level of anxiety will decrease Outcome: Progressing   Problem: Elimination: Goal: Will not experience complications related to bowel motility Outcome: Progressing Goal: Will not experience complications related to urinary retention Outcome: Progressing   Problem: Pain Managment: Goal: General experience of comfort will improve Outcome: Progressing   Problem: Safety: Goal: Ability to remain free from injury will improve Outcome: Progressing   Problem: Skin Integrity: Goal: Risk for impaired skin integrity will decrease Outcome: Progressing

## 2019-07-25 NOTE — Progress Notes (Signed)
Inpatient Diabetes Program Recommendations  AACE/ADA: New Consensus Statement on Inpatient Glycemic Control (2015)  Target Ranges:  Prepandial:   less than 140 mg/dL      Peak postprandial:   less than 180 mg/dL (1-2 hours)      Critically ill patients:  140 - 180 mg/dL   Lab Results  Component Value Date   GLUCAP 87 07/25/2019   HGBA1C 6.6 03/15/2019    Review of Glycemic Control  Results for [name], [name] (MRN [id number]) as of 07/25/2019 11:14  Ref. Range 07/24/2019 15:01 07/24/2019 16:12 07/24/2019 21:13 07/25/2019 06:11  Glucose-Capillary Latest Ref Range: 70 - 99 mg/dL 156 (H) 127 (H) 125 (H) 87   Diabetes history: DM 2 Outpatient Diabetes medications:  Current orders for Inpatient glycemic control:  Lantus 10 units q HS, Novolog sensitive tid with meals   Inpatient Diabetes Program Recommendations:    Please consider reducing Lantus to 8 units q HS.   Also consider reducing Novolog correction to very sensitive starting at 151 mg/dL.   Thanks,  Adah Perl, RN, BC-ADM Inpatient Diabetes Coordinator Pager [phone] (8a-5p)

## 2019-07-25 NOTE — Progress Notes (Signed)
Inpatient Rehab Admissions:  Note PT recommending no follow up.  Will sign off at this time.   Signed: Shann Medal, PT, DPT Admissions Coordinator [phone] 07/25/19  12:17 PM

## 2019-07-25 NOTE — Consult Note (Signed)
Consultation Note Date: 07/25/2019   Patient Name: [name]  DOB: 1966/03/20  MRN: [id number]  Age / Sex: 53 y.o., male  PCP: Richarda Osmond, DO Referring Physician: Larey Dresser, MD  Reason for Consultation: Establishing goals of care and Psychosocial/spiritual support  HPI/Patient Profile: 53 y.o. 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 Donnetta Hutching for gangrene right or foot.  He is also s/p Left great toe amputation including metatarsal head on 05-29-19 by Dr. Trula Slade for Left great toe ischemia.   He is also s/p placement of Left SFA stent (6 mm x 60 mm drug-coatedEluviastentpostdilated with a 5 mm Mustang) on 05-02-19 by Dr. Carlis Abbott for critical limb ischemia left lower extremity with tissue loss.   He also has ESRD and dialyzes on MWF via left IJ TDC at [name], [place].  Patient had an LVAD placed in October 2018.   Patient faces the physical, functional and emotional struggles of living with life limiting disease.  He faces treatment option decisions, advanced directive decisions and anticipatory care needs.   Clinical Assessment and Goals of Care:   This NP Wadie Lessen reviewed medical records, received report from team, assessed the patient and then meet at the patient's bedside   to discuss diagnosis, prognosis, GOC, EOL wishes disposition and options.  Concept of Palliative Care was  Discussed.  Patient was seen by the palliative medicine team prior to LVAD implantation  A discussion was had today regarding advanced directives.  Concepts specific to code status, artifical feeding and hydration, continued IV antibiotics and rehospitalization was had.  The difference between a aggressive medical intervention path  and a palliative comfort care path for this patient at this time was had.  Values and goals of care important to patient and  family were attempted to be elicited.  We did discuss concepts of human mortality and the limitations of medical interventions to prolong quality of life when the body begins to fail to thrive.  MOST form introduced  Created space and opportunity for patient to explore his thoughts and feelings regarding his current medical situation.  [name] was very open and engaged in today's discussion.  He speaks to his understanding of "I know I have less days ahead of me  then behind me",  worries of being a burden to family, and his concepts of quality of life.  His main focus is comfort and dignity.  Ultimately he hopes to be able to return home, continue dialysis and spend more time with his family specifically his 26-year-old grandson.  Patient lives at home with his 18 year old son who is his main caregiver is 29 year old daughter and his 1 year old son.  We discussed the impact of his disease on his children.  He worries that children do not understand the seriousness of his medical situation and is open to family meeting.  A meeting is scheduled for Monday the 23rd at 4:00 with this nurse practitioner and Raquel Sarna and of course the patient  and his family   Questions and concerns addressed.   Patient  encouraged to call with questions or concerns.    PMT will continue to support holistically.   Emotional support offered.   Patient's documented healthcare power of attorney is his daughter Dareen Piano and secondary is son Kenshin E Dibella II   SUMMARY OF RECOMMENDATIONS    Code Status/Advance Care Planning:  Limited code    Palliative Prophylaxis:   Bowel Regimen, Delirium Protocol, Frequent Pain Assessment and Oral Care  Additional Recommendations (Limitations, Scope, Preferences):  Full Scope Treatment  Psycho-social/Spiritual:   Desire for further Chaplaincy support:yes  Additional Recommendations: Education on Hospice  Prognosis:   Unable to  determine--will depend on decisions for continuation of life prolonging measures i.e. dialysis  Discharge Planning: To Be Determined      Primary Diagnoses: Present on Admission: . Ischemic foot . PAD (peripheral artery disease) ([place])   I have reviewed the medical record, interviewed the patient and family, and examined the patient. The following aspects are pertinent.  Past Medical History:  Diagnosis Date  . Angina   . ASCVD (arteriosclerotic cardiovascular disease)    , Anterior infarction 2005, LAD diagonal bifurcation intervention 03/2004  . Automatic implantable cardiac defibrillator -St. Jude's       . Benign neoplasm of colon   . CHF (congestive heart failure) ([place])   . Chronic systolic heart failure ([place])   . Coronary artery disease     Widely patent previously placed stents in the left anterior   . Crohn's disease ([place])   . Deep venous thrombosis ([facility])    Recurrent-on Coumadin  . Dialysis patient ([place])   . Dyspnea   . Gastroparesis   . GERD (gastroesophageal reflux disease)   . High cholesterol   . Hyperlipidemia   . Hypersomnolent    Previous diagnosis of narcolepsy  . Hypertension, essential   . Ischemic cardiomyopathy    Ejection fraction 15-20% catheterization 2010  . Type II or unspecified type diabetes mellitus without mention of complication, not stated as uncontrolled   . Unspecified gastritis and gastroduodenitis without mention of hemorrhage    Social History   Socioeconomic History  . Marital status: Married    Spouse name: Not on file  . Number of children: Not on file  . Years of education: Not on file  . Highest education level: Not on file  Occupational History  . Not on file  Social Needs  . Financial resource strain: Not on file  . Food insecurity    Worry: Not on file    Inability: Not on file  . Transportation needs    Medical: Not on file    Non-medical: Not on file  Tobacco Use  . Smoking status: Never Smoker  . Smokeless  tobacco: Never Used  Substance and Sexual Activity  . Alcohol use: No  . Drug use: No  . Sexual activity: Never  Lifestyle  . Physical activity    Days per week: Not on file    Minutes per session: Not on file  . Stress: Not on file  Relationships  . Social Herbalist on phone: Not on file    Gets together: Not on file    Attends religious service: Not on file    Active member of club or organization: Not on file    Attends meetings of clubs or organizations: Not on file    Relationship status: Not on file  Other Topics Concern  .  Not on file  Social History Narrative  . Not on file   Family History  Problem Relation Age of Onset  . Colon polyps Mother   . Diabetes Mother   . Heart attack Father   . Diabetes Father   . Stroke Paternal Grandfather   . Colitis Sister   . Heart disease Brother   . Colitis Sister   . Colon cancer Neg Hx    Scheduled Meds: . aspirin EC  81 mg Oral Daily  . atorvastatin  40 mg Oral q1800  . calcitRIOL  0.5 mcg Oral Q M,W,F-HD  . Chlorhexidine Gluconate Cloth  6 each Topical Daily  . citalopram  20 mg Oral Daily  . docusate sodium  100 mg Oral Daily  . dronabinol  2.5 mg Oral BID AC  . erythromycin  250 mg Oral TID  . gabapentin  300 mg Oral BID  . heparin  5,000 Units Subcutaneous Q8H  . influenza vac split quadrivalent PF  0.5 mL Intramuscular Tomorrow-1000  . insulin aspart  0-9 Units Subcutaneous TID WC  . insulin glargine  10 Units Subcutaneous QHS  . metoCLOPramide  10 mg Oral TID WC  . midodrine  5 mg Oral Q M,W,F  . multivitamin  1 tablet Oral Daily  . pantoprazole  40 mg Oral BID AC  . pneumococcal 23 valent vaccine  0.5 mL Intramuscular Tomorrow-1000  . sevelamer carbonate  0.8 g Oral TID WC  . sodium chloride flush  10-40 mL Intracatheter Q12H  . sodium chloride flush  3 mL Intravenous Q12H  . Warfarin - Pharmacist Dosing Inpatient   Does not apply q1800   Continuous Infusions: . sodium chloride    . ferric  gluconate (FERRLECIT/NULECIT) IV 62.5 mg (07/24/19 1458)  . heparin 1,100 Units/hr (07/25/19 0836)  . magnesium sulfate bolus IVPB     PRN Meds:.sodium chloride, acetaminophen **OR** acetaminophen, docusate sodium, fentaNYL (SUBLIMAZE) injection, guaiFENesin-dextromethorphan, hydrALAZINE, labetalol, lidocaine, LORazepam, LORazepam, magnesium sulfate bolus IVPB, metoprolol tartrate, morphine injection, oxyCODONE-acetaminophen, phenol, promethazine, promethazine, sodium chloride flush, sodium chloride flush Medications Prior to Admission:  Prior to Admission medications   Medication Sig Start Date End Date Taking? Authorizing Provider  acetaminophen (TYLENOL) 325 MG tablet Take 2 tablets (650 mg total) by mouth every 4 (four) hours as needed for headache or mild pain. 03/22/19   Kroeger, Lorelee Cover., PA-C  aspirin EC 81 MG tablet Take 1 tablet (81 mg total) by mouth daily. 08/20/18   Eileen Stanford, PA-C  atorvastatin (LIPITOR) 40 MG tablet Take 1 tablet (40 mg total) by mouth daily at 6 PM. 01/25/18   [name], MD  calcitRIOL (ROCALTROL) 0.5 MCG capsule Take 1 capsule (0.5 mcg total) by mouth every Monday, Wednesday, and Friday. 06/06/18   Georgiana Shore, NP  citalopram (CELEXA) 20 MG tablet Take 1 tablet (20 mg total) by mouth daily. 03/19/18   Georgiana Shore, NP  docusate sodium (COLACE) 100 MG capsule Take 1 capsule (100 mg total) by mouth daily as needed for mild constipation. Patient taking differently: Take 100 mg by mouth daily.  03/22/19   Kroeger, Lorelee Cover., PA-C  dronabinol (MARINOL) 2.5 MG capsule Take 1 capsule (2.5 mg total) by mouth 2 (two) times daily before lunch and supper. 03/27/18   Larey Dresser, MD  erythromycin (ERYTHROCIN) 250 MG tablet Take 250 mg by mouth 3 (three) times daily.    [provider]  gabapentin (NEURONTIN) 300 MG capsule Take 1 capsule (300 mg  total) by mouth 2 (two) times daily. 04/16/19   Larey Dresser, MD  insulin glargine  (LANTUS) 100 UNIT/ML injection Inject 0.1 mLs (10 Units total) into the skin daily. Patient taking differently: Inject 10 Units into the skin at bedtime.  05/23/19   Anderson, Chelsey L, DO  LORazepam (ATIVAN) 0.5 MG tablet Take 1 tablet (0.5 mg total) by mouth every 12 (twelve) hours as needed (nausea). 05/17/19   Larey Dresser, MD  metoCLOPramide (REGLAN) 10 MG tablet Take 1 tablet (10 mg total) by mouth 3 (three) times daily. Patient taking differently: Take 10 mg by mouth 3 (three) times daily with meals.  01/22/18   Georgiana Shore, NP  midodrine (PROAMATINE) 5 MG tablet Take 1 tablet (5 mg total) by mouth daily. Take 1 tablet before dialysis treatment Patient taking differently: Take 5 mg by mouth every Monday, Wednesday, and Friday. Take 1 tablet before dialysis treatment 04/04/19   Clegg, Amy D, NP  multivitamin (RENA-VIT) TABS tablet Take 1 tablet by mouth daily.    [provider]  NOVOLOG 100 UNIT/ML injection INJECT 7 UNITS INTO THE SKIN DAILY WITH SUPPER. Patient taking differently: Inject 7 Units into the skin daily with supper.  03/15/19   Anderson, Chelsey L, DO  oxyCODONE (ROXICODONE) 5 MG immediate release tablet Take 1 tablet (5 mg total) by mouth 2 (two) times daily as needed for severe pain. Take 1-2 tablets every 4-6 hours for severe pain. 05/21/19   Larey Dresser, MD  pantoprazole (PROTONIX) 40 MG tablet Take 1 tablet (40 mg total) by mouth 2 (two) times daily before lunch and supper. 05/07/19   Barrett, Evelene Croon, PA-C  promethazine (PHENERGAN) 12.5 MG tablet Take 1 tablet (12.5 mg total) by mouth every 6 (six) hours as needed for nausea or vomiting. 10/23/18   Larey Dresser, MD  sevelamer carbonate (RENVELA) 0.8 g PACK packet Take 0.8 g by mouth 3 (three) times daily with meals. 06/16/19   Leanor Kail, PA  warfarin (COUMADIN) 5 MG tablet Hold warfarin tonight and tomorrow (06/16/2019 and 06/17/2019). Check INR on 06/18/2019 and start medications based on  readings  (previously on 7.79m on MWF and 53mall other days). 06/16/19   BhLeanor KailPA   Allergies  Allergen Reactions  . Metformin And Related Diarrhea   Review of Systems  Constitutional: Positive for fever.    Physical Exam  Vital Signs: BP 100/90   Pulse 88   Temp 97.6 F (36.4 C) (Oral)   Resp 16   Ht 5' 8"  (1.727 m)   Wt 79.2 kg   SpO2 100%   BMI 26.55 kg/m  Pain Scale: 0-10 POSS *See Group Information*: 1-Acceptable,Awake and alert Pain Score: 0-No pain   SpO2: SpO2: 100 % O2 Device:SpO2: 100 % O2 Flow Rate: .O2 Flow Rate (L/min): 3 L/min  IO: Intake/output summary:   Intake/Output Summary (Last 24 hours) at 07/25/2019 0847 Last data filed at 07/24/2019 1900 Gross per 24 hour  Intake 820 ml  Output 961 ml  Net -141 ml    LBM: Last BM Date: 07/23/19 Baseline Weight: Weight: 85.6 kg Most recent weight: Weight: 79.2 kg     Palliative Assessment/Data:  30 % at best   Discussed with JaLysle PearlCSW  Time In: 0830 Time Out: 0945 Time Total: 75 minutes Greater than 50%  of this time was spent counseling and coordinating care related to the above assessment and plan.  Signed by: MaWadie LessenNP  Please contact Palliative Medicine Team phone at [phone] for questions and concerns.  For individual provider: See Shea Evans

## 2019-07-25 NOTE — Evaluation (Signed)
Physical Therapy Evaluation Patient Details Name: [name] MRN: [id number] DOB: Nov 27, 1965 Today's Date: 07/25/2019   History of Present Illness  53 y.o. 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 Pt underwent L AKA on 07/23/2019.  Clinical Impression  Pt demonstrates deficits in balance, functional mobility, endurance, however the pt does not appear far from his reported PLOF. Pt is able to perform bed mobility and transfer with sliding board and limited PT assistance. Pt is at a falls risk due to balance and mobility deficits, and will benefit from continued acute PT services to provide intervention concerning functional mobility, balance, therapeutic exercise, wheelchair mobility, and stair training. Pt has 24/7 assistance from family at home and appears to be at a functional level for discharge home with family education on stair training.    Follow Up Recommendations No PT follow up;Supervision/Assistance - 24 hour    Equipment Recommendations  None recommended by PT(already owns necessary DME)    Recommendations for Other Services       Precautions / Restrictions Precautions Precautions: Fall;Other (comment) Precaution Comments: LVAD, B AKA Restrictions Weight Bearing Restrictions: Yes RLE Weight Bearing: Non weight bearing LLE Weight Bearing: Non weight bearing      Mobility  Bed Mobility Overal bed mobility: Needs Assistance Bed Mobility: Supine to Sit     Supine to sit: Supervision;HOB elevated        Transfers Overall transfer level: Needs assistance Equipment used: Sliding board Transfers: Lateral/Scoot Transfers          Lateral/Scoot Transfers: Min assist General transfer comment: assistance to place sliding board, minG for balance at times during transfer  Ambulation/Gait                Stairs             Wheelchair Mobility    Modified Rankin (Stroke Patients Only)       Balance Overall balance assessment: Needs assistance Sitting-balance support: Bilateral upper extremity supported Sitting balance-[name]: Good Sitting balance - Comments: supervision                                     Pertinent Vitals/Pain Pain Assessment: Faces Faces Pain Scale: Hurts a little bit Pain Location: L AKA Pain Descriptors / Indicators: Sore;Discomfort Pain Intervention(s): Limited activity within patient's tolerance    Home Living Family/patient expects to be discharged to:: Private residence Living Arrangements: Children Available Help at Discharge: Family;Available 24 hours/day Type of Home: House Home Access: Stairs to enter Entrance Stairs-Rails: None Entrance Stairs-Number of Steps: 2 Home Layout: Multi-level;Able to live on main level with bedroom/bathroom Home Equipment: Kasandra Knudsen - single point;Walker - 4 wheels;Walker - 2 wheels;Wheelchair - manual Additional Comments: pt lives with son and daughter, one of which is with him all the time. His bedroom is downstairs but the kids have moved him to the main level so he will only have to do 2 STE    Prior Function Level of Independence: Needs assistance   Gait / Transfers Assistance Needed: pt reports requiring assistance for all mobility, with some needed for bed mobility, transfers, and wheelchair mobility. Pt performs slide board transfers from bed to wheelchair and is non-ambulatory.           Hand Dominance   Dominant Hand: Right  Extremity/Trunk Assessment   Upper Extremity Assessment Upper Extremity Assessment: Overall WFL for tasks assessed    Lower Extremity Assessment Lower Extremity Assessment: RLE deficits/detail;LLE deficits/detail RLE Deficits / Details: R AKA, hip ROM WFL LLE Deficits / Details: L AKA, Hip ROM WFL    Cervical / Trunk Assessment Cervical / Trunk Assessment: Normal   Communication   Communication: No difficulties  Cognition Arousal/Alertness: Awake/alert Behavior During Therapy: WFL for tasks assessed/performed;Flat affect Overall Cognitive Status: Within Functional Limits for tasks assessed                                        General Comments General comments (skin integrity, edema, etc.): VSS, pt on room air    Exercises     Assessment/Plan    PT Assessment Patient needs continued PT services  PT Problem List Decreased activity tolerance;Decreased balance;Decreased mobility;Decreased knowledge of use of DME       PT Treatment Interventions DME instruction;Stair training;Functional mobility training;Therapeutic activities;Therapeutic exercise;Balance training;Neuromuscular re-education;Patient/family education;Wheelchair mobility training    PT Goals (Current goals can be found in the Care Plan section)  Acute Rehab PT Goals Patient Stated Goal: to go home  PT Goal Formulation: With patient Time For Goal Achievement: 08/08/19 Potential to Achieve Goals: Good Additional Goals Additional Goal #1: Pt will mobilize in manual wheelchair with minA for 50 feet to increase independence in mobility in the home    Frequency Min 3X/week   Barriers to discharge        Co-evaluation               AM-PAC PT "6 Clicks" Mobility  Outcome Measure Help needed turning from your back to your side while in a flat bed without using bedrails?: None Help needed moving from lying on your back to sitting on the side of a flat bed without using bedrails?: None Help needed moving to and from a bed to a chair (including a wheelchair)?: A Little Help needed standing up from a chair using your arms (e.g., wheelchair or bedside chair)?: A Little Help needed to walk in hospital room?: Total Help needed climbing 3-5 steps with a railing? : Total 6 Click Score: 16    End of Session Equipment Utilized During Treatment: (none) Activity  Tolerance: Patient tolerated treatment well Patient left: in chair;with call bell/phone within reach Nurse Communication: Mobility status PT Visit Diagnosis: Other abnormalities of gait and mobility (R26.89) Pain - Right/Left: Left Pain - part of body: Leg    Time: 4403-4742 PT Time Calculation (min) (ACUTE ONLY): 32 min   Charges:   PT Evaluation $PT Eval Moderate Complexity: [place], PT, DPT Acute Rehabilitation Pager: [phone]   Zenaida Niece 07/25/2019, 11:29 AM

## 2019-07-25 NOTE — Progress Notes (Signed)
LVAD Coordinator Rounding Note:  Admitted 07/22/19 per Dr. Aundra Dubin for heparin bridge for left AKA.    HM III LVAD implanted on 06/12/17 by Dr. Darcey Nora under Destination Therapy criteria. Pump exchange performed on 03/01/18 per Dr. Darcey Nora for pump thrombus.   Pt in the chair this morning. He was able to transfer himself with a sliding board with PT observation. Pt is much more awake this morning. I encouraged him to try to use the IV pain meds less today and take the PO meds prescribed.  Vital signs: Temp: 97.6 HR: 86 Doppler Pressure:  82 Automatic BP:  100/90 (94) O2 Sat: 100 % RA  Wt: 188.7>177>174.6 lbs   LVAD interrogation reveals:  Speed: 5500 Flow: 4.1 Power: 4.4w PI: 3.6 Alarms: none Events: no events today Hematocrit: 27  Fixed speed: 5500 Low speed limit: 5200   Drive Line: Dressing C/D/I; anchor intact and accurately applied. Weekly dressing changes per bedside nurse; next dressing change due 07/28/19.  Labs: LDH trend: 183>160  INR trend: 1.2   Anticoagulation Plan: -INR Goal: 2.0 - 3.0 -ASA Dose: 81 mg daily - Heparin 1100 u/hr - Coumadin started last night 07/24/19  Device: St Jude dual ICD - Therapies: on 200 bpm - Last check: 01/01/18 - 2 hrs Afl/Afib; EMI 04/02/18   Plan/Recommendations:  1. Call VAD pager if any questions about VAD equipment or drive line.   [name] RN [place] Coordinator  Office: [phone]  24/7 Pager: [phone]

## 2019-07-25 NOTE — Progress Notes (Signed)
CSW met at bedside with patient's daughter Margreta Journey to discuss family meeting on Monday. Patient sleeping at the time of visit. CSW and daughter spoke briefly about patient's health journey and interest in having a family meeting to discuss goals of care. Margreta Journey stated she and her brother can be here on Monday at 4pm for a family meeting with CSW and Palliative Care. CSW will arrange for visitation and follow up with Palliative Care. CSW continues to follow for supportive intervention. Raquel Sarna, [place], [place]

## 2019-07-25 NOTE — Progress Notes (Signed)
Patient ID: [name], male   DOB: [birth year], 53 y.o.   MRN: [id number]     Advanced Heart Failure Rounding Note  PCP-Cardiologist: Dr. Aundra Dubin   Subjective:    S/p left AKA. POD#2. Still with left stump pain.  Was getting Fentanyl PCA but became oversedated yesterday pm and required Narcan.   He is now awake/alert.   Hgb 7.7 => 7.9.    LVAD interrogation HM3:  Flow 4.1 L/min speed 5500 rpm PI 3.5 power 4.3.  No PI events.    Objective:   Weight Range: 79.2 kg Body mass index is 26.55 kg/m.   Vital Signs:   Temp:  [97.7 F (36.5 C)-99 F (37.2 C)] 99 F (37.2 C) (11/19 0313) Pulse Rate:  [28-116] 88 (11/19 0313) Resp:  [6-30] 13 (11/19 0700) BP: (74-135)/(34-113) 100/90 (11/19 0700) SpO2:  [92 %-100 %] 100 % (11/19 0313) Weight:  [77.5 kg-79.2 kg] 79.2 kg (11/19 0500) Last BM Date: 07/23/19  Weight change: Filed Weights   07/24/19 0749 07/24/19 1150 07/25/19 0500  Weight: 78.4 kg 77.5 kg 79.2 kg    Intake/Output:   Intake/Output Summary (Last 24 hours) at 07/25/2019 0734 Last data filed at 07/24/2019 1900 Gross per 24 hour  Intake 820 ml  Output 961 ml  Net -141 ml      Physical Exam  MAP 82 General: Well appearing this am. NAD.  HEENT: Normal. Neck: Supple, JVP 7-8 cm. Carotids OK.  Cardiac:  Mechanical heart sounds with LVAD hum present.  Lungs:  CTAB, normal effort.  Abdomen:  NT, ND, no HSM. No bruits or masses. +BS  LVAD exit site: Well-healed and incorporated. Dressing dry and intact. No erythema or drainage. Stabilization device present and accurately applied. Driveline dressing changed daily per sterile technique. Extremities:  S/p bilateral AKA Neuro:  Alert & oriented x 3. Cranial nerves grossly intact. Moves all 4 extremities w/o difficulty. Affect pleasant     Telemetry   NSR 90s  EKG    No new EKG to review today   Labs    CBC Recent Labs    07/24/19 2055 07/25/19 0500  WBC 10.8* 12.1*  HGB 7.7* 7.9*  HCT 26.4*  26.9*  MCV 86.8 88.5  PLT 160 038   Basic Metabolic Panel Recent Labs    07/24/19 0353 07/25/19 0500  NA 130* 133*  K 4.9 4.0  CL 89* 97*  CO2 25 26  GLUCOSE 75 95  BUN 33* 15  CREATININE 8.39* 5.33*  CALCIUM 8.9 8.4*   Liver Function Tests Recent Labs    07/22/19 1425  AST 18  ALT 14  ALKPHOS 112  BILITOT 0.6  PROT 7.8  ALBUMIN 2.7*   No results for input(s): LIPASE, AMYLASE in the last 72 hours. Cardiac Enzymes No results for input(s): CKTOTAL, CKMB, CKMBINDEX, TROPONINI in the last 72 hours.  BNP: BNP (last 3 results) No results for input(s): BNP in the last 8760 hours.  ProBNP (last 3 results) No results for input(s): PROBNP in the last 8760 hours.   D-Dimer No results for input(s): DDIMER in the last 72 hours. Hemoglobin A1C No results for input(s): HGBA1C in the last 72 hours. Fasting Lipid Panel No results for input(s): CHOL, HDL, LDLCALC, TRIG, CHOLHDL, LDLDIRECT in the last 72 hours. Thyroid Function Tests No results for input(s): TSH, T4TOTAL, T3FREE, THYROIDAB in the last 72 hours.  Invalid input(s): FREET3  Other results:   Imaging    No results found.   Medications:  Scheduled Medications: . aspirin EC  81 mg Oral Daily  . atorvastatin  40 mg Oral q1800  . calcitRIOL  0.5 mcg Oral Q M,W,F-HD  . Chlorhexidine Gluconate Cloth  6 each Topical Daily  . citalopram  20 mg Oral Daily  . docusate sodium  100 mg Oral Daily  . dronabinol  2.5 mg Oral BID AC  . erythromycin  250 mg Oral TID  . gabapentin  300 mg Oral BID  . heparin  5,000 Units Subcutaneous Q8H  . influenza vac split quadrivalent PF  0.5 mL Intramuscular Tomorrow-1000  . insulin aspart  0-9 Units Subcutaneous TID WC  . insulin glargine  10 Units Subcutaneous QHS  . metoCLOPramide  10 mg Oral TID WC  . midodrine  5 mg Oral Q M,W,F  . multivitamin  1 tablet Oral Daily  . pantoprazole  40 mg Oral BID AC  . pneumococcal 23 valent vaccine  0.5 mL Intramuscular  Tomorrow-1000  . sevelamer carbonate  0.8 g Oral TID WC  . sodium chloride flush  10-40 mL Intracatheter Q12H  . sodium chloride flush  3 mL Intravenous Q12H  . Warfarin - Pharmacist Dosing Inpatient   Does not apply q1800    Infusions: . sodium chloride    . ferric gluconate (FERRLECIT/NULECIT) IV 62.5 mg (07/24/19 1458)  . magnesium sulfate bolus IVPB      PRN Medications: sodium chloride, acetaminophen **OR** acetaminophen, docusate sodium, fentaNYL (SUBLIMAZE) injection, guaiFENesin-dextromethorphan, hydrALAZINE, labetalol, lidocaine, LORazepam, LORazepam, magnesium sulfate bolus IVPB, metoprolol tartrate, morphine injection, oxyCODONE-acetaminophen, phenol, promethazine, promethazine, sodium chloride flush, sodium chloride flush    Patient Profile   [name] is a 53 year old withCAD s/p anterior MI in 2010 and NSTEMI in 3/18 with DES to pRCA and mLAD, gastroparesis,ischemic cardiomyopathy now s/p Heartmate 3 LVAD and ESRD on HD, factor V leiden, PAF, and severe bilateral PAD s/p amputation of the 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 Now being readmitted for planned left AKA.  Assessment/Plan   1. Chronic Systolic CHF s/p HM3 LVAD: Ischemic cardiomyopathy. St Jude ICD. NSTEMI in March 2018 with DES to LAD and RCA, complicated by cardiogenic shock, low output requiring milrinone. Echo 8/18 with EF 20-25%, moderate [name]. CPX (8/18) with severe functional impairment due to HF. He is s/p HM3 LVAD placement. He had pump thrombosis 6/19 due to [facility] outflow graft kinking. He had pump exchange for another HM3.Not a candidate for transplant with PAD.Volume status managed at HD. INR 1.2, hgb higher at 7.9, no surgical site bleeding. LDH 160.  - Continue coumadin and ASA 81.  Will use heparin bridge starting today until INR 1.8.  2. 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 Left foot failed to improved.  s/p left AKA this admit,  POD #2. Wound site stable.  - post op management per [facility] - Oversedation yesterday, stopped PCA and will use nurse-administered Fentanyl boluses as needed for pain.   3. ESRD - HD [facility]. Nephrology following  4.CAD: NSTEMI in 3/18. LHC with 99% ulcerated lesion proximal RCA with left to right collaterals, 95% mid LAD stenosis after mid LAD stent. s/p PCI to RCA and LAD on 11/25/16.Denies chest pain.  - Continue statin 5. H/o DVTs: Factor V Leiden heterozygote. - coumadin w/ heparin bridge.  6. Diabetic gastroparesis: Long history of this. - on reglan 10 mg tid/ac and Marinol as outpatient  - Continue Protonix bid.  -  He will continue erythromycinindefinitely.  - Limit narcotic use as much as possible. 7. HTN: has been controlled, low yesterday with oversedation. - Normally gets hydralazine 25 mg tid on non-HD days, hold for now with hypotension yesterday.   - Requires midodrine w/ HD.  8. T2DM: continue home insulin regimen   Length of Stay: 3  Loralie Champagne, MD  07/25/2019, 7:34 AM  Advanced Heart Failure Team Pager [phone] (M-F; [place])  Please contact [facility] for night-coverage after hours (4p -7a ) and weekends on [email or web address]

## 2019-07-25 NOTE — Progress Notes (Signed)
[facility]  Subjective  - pain improved   Objective 100/90 88 99 F (37.2 C) (Axillary) 13 100%  Intake/Output Summary (Last 24 hours) at 07/25/2019 0830 Last data filed at 07/24/2019 1900 Gross per 24 hour  Intake 820 ml  Output 961 ml  Net -141 ml   AKA left side healing no obvious drainage  Assessment/Planning: POD #2 left Aka  Agree with Dr Aundra Dubin need to wean off narcotic pain meds  D/c home when controlled with oral pain meds  [name] 07/25/2019 8:30 AM --  Laboratory Lab Results: Recent Labs    07/24/19 2055 07/25/19 0500  WBC 10.8* 12.1*  HGB 7.7* 7.9*  HCT 26.4* 26.9*  PLT 160 193   BMET Recent Labs    07/24/19 0353 07/25/19 0500  NA 130* 133*  K 4.9 4.0  CL 89* 97*  CO2 25 26  GLUCOSE 75 95  BUN 33* 15  CREATININE 8.39* 5.33*  CALCIUM 8.9 8.4*    COAG Lab Results  Component Value Date   INR 1.2 07/25/2019   INR 1.2 07/24/2019   INR 1.1 07/23/2019   No results found for: PTT

## 2019-07-25 NOTE — Progress Notes (Signed)
[facility] for Warfarin & Heparin Indication: LVAD  Allergies  Allergen Reactions  . Metformin And Related Diarrhea    Patient Measurements: Height: 5' 8"  (172.7 cm) Weight: 174 lb 9.7 oz (79.2 kg) IBW/kg (Calculated) : 68.4 Heparin Dosing Weight: 82 kg  Vital Signs: Temp: 99 F (37.2 C) (11/19 0313) Temp Source: Axillary (11/19 0313) BP: 100/90 (11/19 0700) Pulse Rate: 88 (11/19 0313)  Labs: Recent Labs    07/22/19 2330 07/23/19 0500 07/24/19 0353 07/24/19 2055 07/25/19 0500  HGB  --  9.0* 8.7* 7.7* 7.9*  HCT  --  29.9* 29.3* 26.4* 26.9*  PLT  --  207 243 160 193  LABPROT  --  13.9 14.7  --  14.9  INR  --  1.1 1.2  --  1.2  HEPARINUNFRC <0.10* <0.10* <0.10*  --   --   CREATININE  --  6.50* 8.39*  --  5.33*    Estimated Creatinine Clearance: 15.3 mL/min (A) (by C-G formula based on SCr of 5.33 mg/dL (H)).   Medical History: Past Medical History:  Diagnosis Date  . Angina   . ASCVD (arteriosclerotic cardiovascular disease)    , Anterior infarction 2005, LAD diagonal bifurcation intervention 03/2004  . Automatic implantable cardiac defibrillator -St. Jude's       . Benign neoplasm of colon   . CHF (congestive heart failure) ([place])   . Chronic systolic heart failure ([place])   . Coronary artery disease     Widely patent previously placed stents in the left anterior   . Crohn's disease ([place])   . Deep venous thrombosis (HCC)    Recurrent-on Coumadin  . Dialysis patient ([place])   . Dyspnea   . Gastroparesis   . GERD (gastroesophageal reflux disease)   . High cholesterol   . Hyperlipidemia   . Hypersomnolent    Previous diagnosis of narcolepsy  . Hypertension, essential   . Ischemic cardiomyopathy    Ejection fraction 15-20% catheterization 2010  . Type II or unspecified type diabetes mellitus without mention of complication, not stated as uncontrolled   . Unspecified gastritis and gastroduodenitis without mention of  hemorrhage     Medications:  Infusions:  . sodium chloride    . ferric gluconate (FERRLECIT/NULECIT) IV 62.5 mg (07/24/19 1458)  . magnesium sulfate bolus IVPB      Assessment: 53 yo male with LVAD, on chronic Coumadin admitted for L AKA. Pharmacy asked to dose coumadin and start heparin bridge until INR 1.8.  Restarted Coumadin yesterday after holding for several days in anticipation of procedure. INR unchanged today at 1.2. CBC low but stable.   Patient had been therapeutic during prior admissions on heparin drip rate of 1300 units/hr. Will restart heparin at lower dose given recent amputation.  Goal of Therapy:  Heparin level 0.3-0.4  INR 2-3 Monitor platelets by anticoagulation protocol: Yes   Plan:  - Warfarin 80m PO x1 - Start Heparin at 1100 units/hr - Check 8-hr heparin level - Daily Protime INR, heparin level, CBC - Monitor for signs of bleeding  CRichardine Service PharmD PGY1 Pharmacy Resident Phone: [phone]/19/2020  9:30 AM  Please check [email or web address] for unit-specific pharmacy phone numbers.

## 2019-07-25 NOTE — Plan of Care (Signed)
  Problem: Education: Goal: Patient will be able to verbalize current INR target range and antiplatelet therapy for discharge home Outcome: Progressing   Problem: Education: Goal: Knowledge of the prescribed therapeutic regimen will improve Outcome: Progressing   Problem: Activity: Goal: Ability to perform//tolerate increased activity and mobilize with assistive devices will improve Outcome: Progressing

## 2019-07-26 ENCOUNTER — Other Ambulatory Visit (HOSPITAL_COMMUNITY): Payer: Self-pay | Admitting: Pharmacist

## 2019-07-26 LAB — CBC
HCT: 24.6 % — ABNORMAL LOW (ref 39.0–52.0)
Hemoglobin: 7.3 g/dL — ABNORMAL LOW (ref 13.0–17.0)
MCH: 25.6 pg — ABNORMAL LOW (ref 26.0–34.0)
MCHC: 29.7 g/dL — ABNORMAL LOW (ref 30.0–36.0)
MCV: 86.3 fL (ref 80.0–100.0)
Platelets: 213 10*3/uL (ref 150–400)
RBC: 2.85 MIL/uL — ABNORMAL LOW (ref 4.22–5.81)
RDW: 17.1 % — ABNORMAL HIGH (ref 11.5–15.5)
WBC: 11.8 10*3/uL — ABNORMAL HIGH (ref 4.0–10.5)
nRBC: 0 % (ref 0.0–0.2)

## 2019-07-26 LAB — BASIC METABOLIC PANEL
Anion gap: 13 (ref 5–15)
BUN: 22 mg/dL — ABNORMAL HIGH (ref 6–20)
CO2: 24 mmol/L (ref 22–32)
Calcium: 8.4 mg/dL — ABNORMAL LOW (ref 8.9–10.3)
Chloride: 93 mmol/L — ABNORMAL LOW (ref 98–111)
Creatinine, Ser: 7.08 mg/dL — ABNORMAL HIGH (ref 0.61–1.24)
GFR calc Af Amer: 9 mL/min — ABNORMAL LOW (ref >60)
GFR calc non Af Amer: 8 mL/min — ABNORMAL LOW (ref >60)
Glucose, Bld: 149 mg/dL — ABNORMAL HIGH (ref 70–99)
Potassium: 4.2 mmol/L (ref 3.5–5.1)
Sodium: 130 mmol/L — ABNORMAL LOW (ref 135–145)

## 2019-07-26 LAB — GLUCOSE, CAPILLARY
Glucose-Capillary: 128 mg/dL — ABNORMAL HIGH (ref 70–99)
Glucose-Capillary: 113 mg/dL — ABNORMAL HIGH (ref 70–99)
Glucose-Capillary: 178 mg/dL — ABNORMAL HIGH (ref 70–99)
Glucose-Capillary: 175 mg/dL — ABNORMAL HIGH (ref 70–99)

## 2019-07-26 LAB — HEPARIN LEVEL (UNFRACTIONATED)
Heparin Unfractionated: 0.34 IU/mL (ref 0.30–0.70)
Heparin Unfractionated: 0.38 IU/mL (ref 0.30–0.70)

## 2019-07-26 LAB — PROTIME-INR
INR: 1.3 — ABNORMAL HIGH (ref 0.8–1.2)
Prothrombin Time: 16.1 seconds — ABNORMAL HIGH (ref 11.4–15.2)

## 2019-07-26 LAB — LACTATE DEHYDROGENASE: LDH: 166 U/L (ref 98–192)

## 2019-07-26 MED ORDER — FENTANYL CITRATE (PF) 100 MCG/2ML IJ SOLN
12.5000 ug | Freq: Four times a day (QID) | INTRAMUSCULAR | Status: DC | PRN
  Administered 2019-07-26 – 2019-07-28 (×3): 12.5 ug via INTRAVENOUS
  Filled 2019-07-26 (×4): qty 2

## 2019-07-26 MED ORDER — HEPARIN SODIUM (PORCINE) 1000 UNIT/ML IJ SOLN
INTRAMUSCULAR | Status: AC
  Administered 2019-07-26: 3800 [IU] via INTRAVENOUS
  Filled 2019-07-26: qty 4

## 2019-07-26 MED ORDER — LORAZEPAM 2 MG/ML IJ SOLN
INTRAMUSCULAR | Status: AC
  Administered 2019-07-26: 0.5 mg via INTRAVENOUS
  Filled 2019-07-26: qty 1

## 2019-07-26 MED ORDER — MIDODRINE HCL 5 MG PO TABS
ORAL_TABLET | ORAL | Status: AC
  Filled 2019-07-26: qty 1

## 2019-07-26 MED ORDER — CALCITRIOL 0.5 MCG PO CAPS
ORAL_CAPSULE | ORAL | Status: AC
  Filled 2019-07-26: qty 1

## 2019-07-26 MED ORDER — WARFARIN SODIUM 7.5 MG PO TABS
7.5000 mg | ORAL_TABLET | Freq: Once | ORAL | Status: AC
  Administered 2019-07-26: 7.5 mg via ORAL
  Filled 2019-07-26: qty 1

## 2019-07-26 MED ORDER — DARBEPOETIN ALFA 200 MCG/0.4ML IJ SOSY
PREFILLED_SYRINGE | INTRAMUSCULAR | Status: AC
  Administered 2019-07-26: 200 ug via INTRAVENOUS
  Filled 2019-07-26: qty 0.4

## 2019-07-26 MED ORDER — DARBEPOETIN ALFA 200 MCG/0.4ML IJ SOSY
200.0000 ug | PREFILLED_SYRINGE | INTRAMUSCULAR | Status: DC
  Administered 2019-07-26: 10:00:00 200 ug via INTRAVENOUS

## 2019-07-26 MED ORDER — HEPARIN SODIUM (PORCINE) 1000 UNIT/ML IJ SOLN
1000.0000 [IU] | INTRAMUSCULAR | Status: DC
  Administered 2019-07-26: 10:00:00 3800 [IU] via INTRAVENOUS
  Administered 2019-07-30: 11:00:00 3400 [IU] via INTRAVENOUS
  Administered 2019-07-30: 08:00:00 3500 [IU] via INTRAVENOUS

## 2019-07-26 MED ORDER — HEPARIN SODIUM (PORCINE) 1000 UNIT/ML DIALYSIS: 20.0000 [IU]/kg | INTRAMUSCULAR | Status: DC | PRN

## 2019-07-26 NOTE — Progress Notes (Signed)
Responded to spiritual care consult for support. PT was alert. He stated he was feeling a little today than yesterday. He said he was looking forward to going back to his daughters house where she will care for him. Also, he stated that he was comfortable with the goals of care that he planned. I was able to offer him words of encouragement and ministry of presence. Chaplain available as needed.   Chaplain Resident  Fidel Levy  [phone]

## 2019-07-26 NOTE — Progress Notes (Signed)
[facility] for Heparin  Indication: LVAD  Allergies  Allergen Reactions  . Metformin And Related Diarrhea    Patient Measurements: Height: 5' 8"  (172.7 cm) Weight: 174 lb 9.7 oz (79.2 kg) IBW/kg (Calculated) : 68.4 Heparin Dosing Weight: 82 kg  Vital Signs: Temp: 99 F (37.2 C) (11/20 0350) Temp Source: Oral (11/20 0350) BP: 99/74 (11/20 0350) Pulse Rate: 84 (11/20 0350)  Labs: Recent Labs    07/24/19 0353 07/24/19 2055 07/25/19 0500 07/25/19 1700 07/26/19 0300  HGB 8.7* 7.7* 7.9*  --  7.3*  HCT 29.3* 26.4* 26.9*  --  24.6*  PLT 243 160 193  --  213  LABPROT 14.7  --  14.9  --  16.1*  INR 1.2  --  1.2  --  1.3*  HEPARINUNFRC <0.10*  --   --  0.22* 0.34  CREATININE 8.39*  --  5.33*  --  7.08*    Estimated Creatinine Clearance: 11.5 mL/min (A) (by C-G formula based on SCr of 7.08 mg/dL (H)).   Medical History: Past Medical History:  Diagnosis Date  . Angina   . ASCVD (arteriosclerotic cardiovascular disease)    , Anterior infarction 2005, LAD diagonal bifurcation intervention 03/2004  . Automatic implantable cardiac defibrillator -St. Jude's       . Benign neoplasm of colon   . CHF (congestive heart failure) ([place])   . Chronic systolic heart failure ([place])   . Coronary artery disease     Widely patent previously placed stents in the left anterior   . Crohn's disease ([place])   . Deep venous thrombosis (HCC)    Recurrent-on Coumadin  . Dialysis patient ([place])   . Dyspnea   . Gastroparesis   . GERD (gastroesophageal reflux disease)   . High cholesterol   . Hyperlipidemia   . Hypersomnolent    Previous diagnosis of narcolepsy  . Hypertension, essential   . Ischemic cardiomyopathy    Ejection fraction 15-20% catheterization 2010  . Type II or unspecified type diabetes mellitus without mention of complication, not stated as uncontrolled   . Unspecified gastritis and gastroduodenitis without mention of hemorrhage      Assessment: 53 yo male with LVAD, on chronic Coumadin admitted for L AKA. Pharmacy asked to dose coumadin and start heparin bridge until INR 1.8.  Heparin level (0.38) therapeutic x2 on drip rate 1250 units/hr. INR still subtherapeutic after two doses of warfarin post-procedure. Hgb low trending down slowly at 7.3. Plts wnl at 213. LDH stable. No overt bleeding or infusion issues noted.  Goal of Therapy:  Heparin level 0.3-0.4 units/mL INR 2-3 Monitor platelets by anticoagulation protocol: Yes   Plan:  Continue heparin at 1250 units/hr Warfarin 7.5 mg PO x1 Daily HL, PT-INR, CBC Monitor for signs of bleeding  Richardine Service, PharmD PGY1 Pharmacy Resident Phone: [phone] 07/26/2019  2:02 PM  Please check [email or web address] for unit-specific pharmacy phone numbers.

## 2019-07-26 NOTE — Progress Notes (Signed)
[facility] for Heparin  Indication: LVAD  Allergies  Allergen Reactions  . Metformin And Related Diarrhea    Patient Measurements: Height: 5' 8"  (172.7 cm) Weight: 174 lb 9.7 oz (79.2 kg) IBW/kg (Calculated) : 68.4 Heparin Dosing Weight: 82 kg  Vital Signs: Temp: 99 F (37.2 C) (11/20 0350) Temp Source: Oral (11/20 0350) BP: 99/74 (11/20 0350) Pulse Rate: 84 (11/20 0350)  Labs: Recent Labs    07/23/19 0500 07/24/19 0353 07/24/19 2055 07/25/19 0500 07/25/19 1700 07/26/19 0300  HGB 9.0* 8.7* 7.7* 7.9*  --  7.3*  HCT 29.9* 29.3* 26.4* 26.9*  --  24.6*  PLT 207 243 160 193  --  213  LABPROT 13.9 14.7  --  14.9  --  16.1*  INR 1.1 1.2  --  1.2  --  1.3*  HEPARINUNFRC <0.10* <0.10*  --   --  0.22* 0.34  CREATININE 6.50* 8.39*  --  5.33*  --   --     Estimated Creatinine Clearance: 15.3 mL/min (A) (by C-G formula based on SCr of 5.33 mg/dL (H)).   Medical History: Past Medical History:  Diagnosis Date  . Angina   . ASCVD (arteriosclerotic cardiovascular disease)    , Anterior infarction 2005, LAD diagonal bifurcation intervention 03/2004  . Automatic implantable cardiac defibrillator -St. Jude's       . Benign neoplasm of colon   . CHF (congestive heart failure) ([place])   . Chronic systolic heart failure ([place])   . Coronary artery disease     Widely patent previously placed stents in the left anterior   . Crohn's disease ([place])   . Deep venous thrombosis (HCC)    Recurrent-on Coumadin  . Dialysis patient ([place])   . Dyspnea   . Gastroparesis   . GERD (gastroesophageal reflux disease)   . High cholesterol   . Hyperlipidemia   . Hypersomnolent    Previous diagnosis of narcolepsy  . Hypertension, essential   . Ischemic cardiomyopathy    Ejection fraction 15-20% catheterization 2010  . Type II or unspecified type diabetes mellitus without mention of complication, not stated as uncontrolled   . Unspecified gastritis and  gastroduodenitis without mention of hemorrhage     Assessment: 53 yo male with LVAD, on chronic Coumadin admitted for L AKA. Pharmacy asked to dose coumadin and start heparin bridge until INR 1.8.  Restarted Coumadin yesterday after holding for several days in anticipation of procedure. INR unchanged today at 1.2. H/H low but stable; platelets 193.  Heparin infusion was initiated this morning at 1100 units/hr. Heparin level drawn ~8.5 hrs starting infusion was 0.22 units/ml, which is below the goal range for this pt. Per RN, no issues with IV or bleeding observed.  11/20 AM update:  Heparin level therapeutic x 1 after rate increase  Goal of Therapy:  Heparin level 0.3-0.4 units/mL INR 2-3 Monitor platelets by anticoagulation protocol: Yes   Plan:  Cont heparin at 1250 units/hr Confirmatory heparin level at [place], PharmD, [place] Pharmacist Phone: [phone]

## 2019-07-26 NOTE — Progress Notes (Signed)
LVAD Coordinator Rounding Note:  Admitted 07/22/19 per Dr. Aundra Dubin for heparin bridge for left AKA.    HM III LVAD implanted on 06/12/17 by Dr. Darcey Nora under Destination Therapy criteria. Pump exchange performed on 03/01/18 per Dr. Darcey Nora for pump thrombus.   Pt currently in dialysis, asleep in the bed. Pt encouraged to take less IV pain meds today and just do PO meds. He states that he will try.   Vital signs: Temp: 98.4 HR: 85 Doppler Pressure:  80 Automatic BP:  100/90 (94) O2 Sat: 98 % RA  Wt: 188.7>177>174.6>180.5 lbs   LVAD interrogation reveals:  Speed: 5500 Flow: 3.9 Power: 4.4w PI: 4.4 Alarms: none Events: no events today Hematocrit: 24  Fixed speed: 5500 Low speed limit: 5200   Drive Line: Dressing C/D/I; anchor intact and accurately applied. Weekly dressing changes per bedside nurse; next dressing change due 07/28/19.  Labs: LDH trend: 183>160>166  INR trend: 1.2>1.3   Anticoagulation Plan: -INR Goal: 2.0 - 3.0 -ASA Dose: 81 mg daily - Heparin 1250 u/hr - Coumadin started last night 07/24/19  Device: St Jude dual ICD - Therapies: on 200 bpm - Last check: 01/01/18 - 2 hrs Afl/Afib; EMI 04/02/18   Plan/Recommendations:  1. Call VAD pager if any questions about VAD equipment or drive line. 2. Bedside nurse to change dressing on Sunday.   [name] RN [place] Coordinator  Office: [phone]  24/7 Pager: [phone]

## 2019-07-26 NOTE — Plan of Care (Signed)
  Problem: Education: Goal: Patient will be able to verbalize current INR target range and antiplatelet therapy for discharge home Outcome: Progressing   Problem: Education: Goal: Knowledge of the prescribed therapeutic regimen will improve Outcome: Progressing Goal: Ability to verbalize activity precautions or restrictions will improve Outcome: Progressing Goal: Understanding of discharge needs will improve Outcome: Progressing   Problem: Activity: Goal: Ability to perform//tolerate increased activity and mobilize with assistive devices will improve Outcome: Progressing

## 2019-07-26 NOTE — Plan of Care (Signed)
  Problem: Education: Goal: Patient will be able to verbalize current INR target range and antiplatelet therapy for discharge home Outcome: Progressing   Problem: Clinical Measurements: Goal: Postoperative complications will be avoided or minimized Outcome: Progressing

## 2019-07-26 NOTE — Telephone Encounter (Signed)
Opened in error

## 2019-07-26 NOTE — Progress Notes (Signed)
Patient ID: [name], male   DOB: 1966/06/10, 53 y.o.   MRN: [id number]     Advanced Heart Failure Rounding Note  PCP-Cardiologist: Dr. Aundra [name]   Subjective:    S/p left AKA. POD#3. Still with left stump pain.  Trying to wean off IV Fentanyl now.  Getting HD today.   Hgb 7.7 => 7.9 => 7.3.    LVAD interrogation HM3:  Flow 3.8 L/min speed 5500 rpm PI 4.3 power 4.5.    Objective:   Weight Range: 81.9 kg Body mass index is 27.45 kg/m.   Vital Signs:   Temp:  [97.6 F (36.4 C)-99.3 F (37.4 C)] 98.4 F (36.9 C) (11/20 0705) Pulse Rate:  [33-123] 88 (11/20 0730) Resp:  [11-19] 18 (11/20 0730) BP: (87-120)/(55-101) 112/95 (11/20 0730) SpO2:  [93 %-100 %] 98 % (11/20 0705) Weight:  [81.9 kg-84.9 kg] 81.9 kg (11/20 0705) Last BM Date: 07/23/19  Weight change: Filed Weights   07/25/19 0500 07/26/19 0605 07/26/19 0705  Weight: 79.2 kg 84.9 kg 81.9 kg    Intake/Output:   Intake/Output Summary (Last 24 hours) at 07/26/2019 0752 Last data filed at 07/25/2019 1709 Gross per 24 hour  Intake 970.34 ml  Output -  Net 970.34 ml      Physical Exam  MAP 84 General: Well appearing this am. NAD.  HEENT: Normal. Neck: Supple, JVP 7-8 cm. Carotids OK.  Cardiac:  Mechanical heart sounds with LVAD hum present.  Lungs:  CTAB, normal effort.  Abdomen:  NT, ND, no HSM. No bruits or masses. +BS  LVAD exit site: Well-healed and incorporated. Dressing dry and intact. No erythema or drainage. Stabilization device present and accurately applied. Driveline dressing changed daily per sterile technique. Extremities:  S/p bilateral AKA.  Neuro:  Alert & oriented x 3. Cranial nerves grossly intact. Moves all 4 extremities w/o difficulty. Affect pleasant    Telemetry   NSR 90s  EKG    No new EKG to review today   Labs    CBC Recent Labs    07/25/19 0500 07/26/19 0300  WBC 12.1* 11.8*  HGB 7.9* 7.3*  HCT 26.9* 24.6*  MCV 88.5 86.3  PLT 193 157   Basic Metabolic  Panel Recent Labs    07/25/19 0500 07/26/19 0300  NA 133* 130*  K 4.0 4.2  CL 97* 93*  CO2 26 24  GLUCOSE 95 149*  BUN 15 22*  CREATININE 5.33* 7.08*  CALCIUM 8.4* 8.4*   Liver Function Tests No results for input(s): AST, ALT, ALKPHOS, BILITOT, PROT, ALBUMIN in the last 72 hours. No results for input(s): LIPASE, AMYLASE in the last 72 hours. Cardiac Enzymes No results for input(s): CKTOTAL, CKMB, CKMBINDEX, TROPONINI in the last 72 hours.  BNP: BNP (last 3 results) No results for input(s): BNP in the last 8760 hours.  ProBNP (last 3 results) No results for input(s): PROBNP in the last 8760 hours.   D-Dimer No results for input(s): DDIMER in the last 72 hours. Hemoglobin A1C Recent Labs    07/25/19 0500  HGBA1C 6.0*   Fasting Lipid Panel No results for input(s): CHOL, HDL, LDLCALC, TRIG, CHOLHDL, LDLDIRECT in the last 72 hours. Thyroid Function Tests No results for input(s): TSH, T4TOTAL, T3FREE, THYROIDAB in the last 72 hours.  Invalid input(s): FREET3  Other results:   Imaging    No results found.   Medications:     Scheduled Medications: . aspirin EC  81 mg Oral Daily  . atorvastatin  40 mg  Oral q1800  . calcitRIOL      . calcitRIOL  0.5 mcg Oral Q M,W,F-HD  . Chlorhexidine Gluconate Cloth  6 each Topical Daily  . Chlorhexidine Gluconate Cloth  6 each Topical Q0600  . citalopram  20 mg Oral Daily  . docusate sodium  100 mg Oral Daily  . dronabinol  2.5 mg Oral BID AC  . erythromycin  250 mg Oral TID  . gabapentin  300 mg Oral BID  . influenza vac split quadrivalent PF  0.5 mL Intramuscular Tomorrow-1000  . insulin aspart  0-6 Units Subcutaneous TID WC  . insulin glargine  8 Units Subcutaneous QHS  . metoCLOPramide  10 mg Oral TID WC  . midodrine      . midodrine  5 mg Oral Q M,W,F  . multivitamin  1 tablet Oral Daily  . pantoprazole  40 mg Oral BID AC  . pneumococcal 23 valent vaccine  0.5 mL Intramuscular Tomorrow-1000  . sevelamer  carbonate  0.8 g Oral TID WC  . sodium chloride flush  10-40 mL Intracatheter Q12H  . sodium chloride flush  3 mL Intravenous Q12H  . Warfarin - Pharmacist Dosing Inpatient   Does not apply q1800    Infusions: . sodium chloride    . ferric gluconate (FERRLECIT/NULECIT) IV 62.5 mg (07/24/19 1458)  . heparin 1,250 Units/hr (07/25/19 1758)  . magnesium sulfate bolus IVPB      PRN Medications: sodium chloride, acetaminophen **OR** acetaminophen, docusate sodium, fentaNYL (SUBLIMAZE) injection, guaiFENesin-dextromethorphan, heparin, hydrALAZINE, labetalol, lidocaine, LORazepam, LORazepam, magnesium sulfate bolus IVPB, metoprolol tartrate, morphine injection, oxyCODONE-acetaminophen, phenol, promethazine, promethazine, sodium chloride flush, sodium chloride flush    Patient Profile   [name] is a 53 year old withCAD s/p anterior MI in 2010 and NSTEMI in 3/18 with DES to pRCA and mLAD, gastroparesis,ischemic cardiomyopathy now s/p Heartmate 3 LVAD and ESRD on HD, factor V leiden, PAF, and severe bilateral PAD s/p amputation of the 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 Now being readmitted for planned left AKA.  Assessment/Plan   1. Chronic Systolic CHF s/p HM3 LVAD: Ischemic cardiomyopathy. [employer] ICD. NSTEMI in March 2018 with DES to LAD and RCA, complicated by cardiogenic shock, low output requiring milrinone. Echo 8/18 with EF 20-25%, moderate [name]. CPX (8/18) with severe functional impairment due to HF. He is s/p HM3 LVAD placement. He had pump thrombosis 6/19 due to [facility] outflow graft kinking. He had pump exchange for another HM3.Not a candidate for transplant with PAD.Volume status managed at HD. INR 1.3, hgb lower at 7.3 but no surgical site bleeding. LDH 166.  - Continue coumadin and ASA 81.  Will use heparin bridge starting today until INR 1.8.  2. 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 Left foot failed to improved.   s/p left AKA this admit, POD #2. Wound site stable. Still with left stump pain.  - post op management per [facility] - Oversedated with Fentanyl PCA.  PCA stopped and now trying to wean off IV Fentanyl onto Percocet.    3. ESRD - HD WMF. Nephrology following  4.CAD: NSTEMI in 3/18. LHC with 99% ulcerated lesion proximal RCA with left to right collaterals, 95% mid LAD stenosis after mid LAD stent. s/p PCI to RCA and LAD on 11/25/16.Denies chest pain.  - Continue statin 5. H/o DVTs: Factor V Leiden heterozygote. - coumadin w/ heparin bridge.  6. Diabetic gastroparesis: Long history  of this. - on reglan 10 mg tid/ac and Marinol as outpatient  - Continue Protonix bid.  - He will continue erythromycinindefinitely.  - Limit narcotic use as much as possible. 7. HTN: has been controlled, low yesterday with oversedation. - Normally gets hydralazine 25 mg tid on non-HD days, hold for now with hypotension yesterday.   - Requires midodrine w/ HD.  8. T2DM: continue home insulin regimen  9. Anemia: Post-op blood loss, hgb 7.3 today.  - Getting 1 unit PRBCs with dialysis.   Length of Stay: 4  Loralie Champagne, MD  07/26/2019, 7:52 AM  Advanced Heart Failure Team Pager [phone] (M-F; 7a - 4p)  Please contact [facility] for night-coverage after hours (4p -7a ) and weekends on [email or web address]

## 2019-07-26 NOTE — Procedures (Signed)
Patient was seen on dialysis and the procedure was supervised.  BFR 400  Via TDC BP is  111/55.   Patient appears to be tolerating treatment well  Louis Meckel 07/26/2019

## 2019-07-26 NOTE — Progress Notes (Signed)
Subjective:  Seen on HD - no /co's-  He tells me might go home on Tuesday  Objective Vital signs in last 24 hours: Vitals:   07/26/19 0730 07/26/19 0800 07/26/19 0830 07/26/19 0900  BP: (!) 112/95 105/90 (!) 113/95 118/90  Pulse: 88 93 92 93  Resp: 18 (!) 22    Temp:      TempSrc:      SpO2:      Weight:      Height:       Weight change: 6.5 kg  Intake/Output Summary (Last 24 hours) at 07/26/2019 0912 Last data filed at 07/25/2019 1709 Gross per 24 hour  Intake 970.34 ml  Output -  Net 970.34 ml   Dialysis Orders: Center: [facility] [place]  on mwf . EDW 79kg HD Bath 2k, 2ca  Time 4hr Heparin 7000.  Access Lij permcath      Calcitriol 0.5 mcg po/HD  Mircera 225 mcg q 2wks (last on 07/17/19)    Venofer  78m  qwkly     Assessment/ Plan: Pt is a 53y.o. yo male with ESRD and LVAD who was admitted on 07/22/2019 with gangrene of left leg  Assessment/Plan: 1. PAD-- s/p left AKA 11/17 per [facility], now bilat AKA-  no abx-  recovering - still on IV pain med and that is what is keeping him here 2. ESRD - normally MWF-  [facility]-  Will be done Sunday - Tuesday and Friday  3. Anemia-  Dropping with surgery - max ESA given 11/11 as OP- will give today as well - getting iron 4. Secondary hyperparathyroidism- on rocaltrol and renvela 5. HTN/volume- goal of 2 liters today-  Sodium low which argues for some volume overload- also face is puffy and is over EDW.  Will need lower EDW at discharge.  Continue midodrine and challenge as able   KLouis Meckel   Labs: Basic Metabolic Panel: Recent Labs  Lab 07/24/19 0353 07/25/19 0500 07/26/19 0300  NA 130* 133* 130*  K 4.9 4.0 4.2  CL 89* 97* 93*  CO2 25 26 24   GLUCOSE 75 95 149*  BUN 33* 15 22*  CREATININE 8.39* 5.33* 7.08*  CALCIUM 8.9 8.4* 8.4*   Liver Function Tests: Recent Labs  Lab 07/22/19 1425  AST 18  ALT 14  ALKPHOS 112  BILITOT 0.6  PROT 7.8  ALBUMIN 2.7*   No results  for input(s): LIPASE, AMYLASE in the last 168 hours. No results for input(s): AMMONIA in the last 168 hours. CBC: Recent Labs  Lab 07/23/19 0500 07/24/19 0353 07/24/19 2055 07/25/19 0500 07/26/19 0300  WBC 11.1* 12.7* 10.8* 12.1* 11.8*  HGB 9.0* 8.7* 7.7* 7.9* 7.3*  HCT 29.9* 29.3* 26.4* 26.9* 24.6*  MCV 85.4 86.2 86.8 88.5 86.3  PLT 207 243 160 193 213   Cardiac Enzymes: No results for input(s): CKTOTAL, CKMB, CKMBINDEX, TROPONINI in the last 168 hours. CBG: Recent Labs  Lab 07/25/19 0611 07/25/19 1149 07/25/19 1604 07/25/19 2153 07/26/19 0601  GLUCAP 87 182* 162* 124* 128*    Iron Studies: No results for input(s): IRON, TIBC, TRANSFERRIN, FERRITIN in the last 72 hours. Studies/Results: No results found. Medications: Infusions: . sodium chloride    . ferric gluconate (FERRLECIT/NULECIT) IV 62.5 mg (07/24/19 1458)  . heparin 1,250 Units/hr (07/25/19 1758)  . magnesium sulfate bolus IVPB      Scheduled Medications: . aspirin EC  81 mg Oral Daily  . atorvastatin  40 mg Oral q1800  . calcitRIOL  0.5 mcg Oral Q M,W,F-HD  . Chlorhexidine Gluconate Cloth  6 each Topical Daily  . Chlorhexidine Gluconate Cloth  6 each Topical Q0600  . citalopram  20 mg Oral Daily  . docusate sodium  100 mg Oral Daily  . dronabinol  2.5 mg Oral BID AC  . erythromycin  250 mg Oral TID  . gabapentin  300 mg Oral BID  . influenza vac split quadrivalent PF  0.5 mL Intramuscular Tomorrow-1000  . insulin aspart  0-6 Units Subcutaneous TID WC  . insulin glargine  8 Units Subcutaneous QHS  . metoCLOPramide  10 mg Oral TID WC  . midodrine  5 mg Oral Q M,W,F  . multivitamin  1 tablet Oral Daily  . pantoprazole  40 mg Oral BID AC  . pneumococcal 23 valent vaccine  0.5 mL Intramuscular Tomorrow-1000  . sevelamer carbonate  0.8 g Oral TID WC  . sodium chloride flush  10-40 mL Intracatheter Q12H  . sodium chloride flush  3 mL Intravenous Q12H  . Warfarin - Pharmacist Dosing Inpatient   Does not  apply q1800    have reviewed scheduled and prn medications.  Physical Exam: General:  NAD Heart: LVAD Lungs: mostly clear- is on [place] Abdomen: soft, non tender  Extremities: post op Dialysis Access: left TDC     07/26/2019,9:12 AM  LOS: 4 days

## 2019-07-26 NOTE — Progress Notes (Signed)
[facility]  Subjective  -still has pain left above-knee amputation   Objective 120/84 97 98.6 F (37 C) (Oral) 20 100%  Intake/Output Summary (Last 24 hours) at 07/26/2019 1345 Last data filed at 07/26/2019 1215 Gross per 24 hour  Intake 738.29 ml  Output 2500 ml  Net -1761.71 ml   Left AKA healing well no drainage no erythema Assessment/Planning: POD #4 left above-knee amputation  Dr. Donzetta Matters is on call this weekend if there are questions  Plan will be to DC home when pain is controlled  [name] 07/26/2019 1:45 PM --  Laboratory Lab Results: Recent Labs    07/25/19 0500 07/26/19 0300  WBC 12.1* 11.8*  HGB 7.9* 7.3*  HCT 26.9* 24.6*  PLT 193 213   BMET Recent Labs    07/25/19 0500 07/26/19 0300  NA 133* 130*  K 4.0 4.2  CL 97* 93*  CO2 26 24  GLUCOSE 95 149*  BUN 15 22*  CREATININE 5.33* 7.08*  CALCIUM 8.4* 8.4*    COAG Lab Results  Component Value Date   INR 1.3 (H) 07/26/2019   INR 1.2 07/25/2019   INR 1.2 07/24/2019   No results found for: PTT

## 2019-07-27 LAB — BPAM RBC
Blood Product Expiration Date: 202012222359
Blood Product Expiration Date: 202012222359
ISSUE DATE / TIME: 202011170912
ISSUE DATE / TIME: 202011170912
Unit Type and Rh: 5100
Unit Type and Rh: 5100

## 2019-07-27 LAB — HEPARIN LEVEL (UNFRACTIONATED)
Heparin Unfractionated: 0.17 IU/mL — ABNORMAL LOW (ref 0.30–0.70)
Heparin Unfractionated: 0.41 IU/mL (ref 0.30–0.70)

## 2019-07-27 LAB — TYPE AND SCREEN
ABO/RH(D): O POS
Antibody Screen: NEGATIVE
Unit division: 0
Unit division: 0

## 2019-07-27 LAB — BASIC METABOLIC PANEL
Anion gap: 9 (ref 5–15)
BUN: 22 mg/dL — ABNORMAL HIGH (ref 6–20)
CO2: 26 mmol/L (ref 22–32)
Calcium: 8.5 mg/dL — ABNORMAL LOW (ref 8.9–10.3)
Chloride: 103 mmol/L (ref 98–111)
Creatinine, Ser: 5.99 mg/dL — ABNORMAL HIGH (ref 0.61–1.24)
GFR calc Af Amer: 11 mL/min — ABNORMAL LOW (ref >60)
GFR calc non Af Amer: 10 mL/min — ABNORMAL LOW (ref >60)
Glucose, Bld: 292 mg/dL — ABNORMAL HIGH (ref 70–99)
Potassium: 3.5 mmol/L (ref 3.5–5.1)
Sodium: 138 mmol/L (ref 135–145)

## 2019-07-27 LAB — GLUCOSE, CAPILLARY
Glucose-Capillary: 298 mg/dL — ABNORMAL HIGH (ref 70–99)
Glucose-Capillary: 213 mg/dL — ABNORMAL HIGH (ref 70–99)
Glucose-Capillary: 154 mg/dL — ABNORMAL HIGH (ref 70–99)
Glucose-Capillary: 283 mg/dL — ABNORMAL HIGH (ref 70–99)

## 2019-07-27 LAB — CBC
HCT: 27.1 % — ABNORMAL LOW (ref 39.0–52.0)
Hemoglobin: 7.8 g/dL — ABNORMAL LOW (ref 13.0–17.0)
MCH: 25.4 pg — ABNORMAL LOW (ref 26.0–34.0)
MCHC: 28.8 g/dL — ABNORMAL LOW (ref 30.0–36.0)
MCV: 88.3 fL (ref 80.0–100.0)
Platelets: 264 10*3/uL (ref 150–400)
RBC: 3.07 MIL/uL — ABNORMAL LOW (ref 4.22–5.81)
RDW: 17.2 % — ABNORMAL HIGH (ref 11.5–15.5)
WBC: 10.9 10*3/uL — ABNORMAL HIGH (ref 4.0–10.5)
nRBC: 0.4 % — ABNORMAL HIGH (ref 0.0–0.2)

## 2019-07-27 LAB — PROTIME-INR
INR: 1.2 (ref 0.8–1.2)
Prothrombin Time: 15.5 seconds — ABNORMAL HIGH (ref 11.4–15.2)

## 2019-07-27 LAB — LACTATE DEHYDROGENASE: LDH: 164 U/L (ref 98–192)

## 2019-07-27 MED ORDER — WARFARIN SODIUM 7.5 MG PO TABS
7.5000 mg | ORAL_TABLET | Freq: Once | ORAL | Status: AC
  Administered 2019-07-27: 7.5 mg via ORAL
  Filled 2019-07-27: qty 1

## 2019-07-27 MED ORDER — CHLORHEXIDINE GLUCONATE CLOTH 2 % EX PADS: 6.0000 | MEDICATED_PAD | Freq: Every day | CUTANEOUS | Status: DC

## 2019-07-27 NOTE — Progress Notes (Signed)
Subjective:   HD yest-  Removed 2500- tolerated well.  No new c/o's   Objective Vital signs in last 24 hours: Vitals:   07/27/19 0337 07/27/19 0554 07/27/19 0800 07/27/19 0815  BP: 107/86  108/71   Pulse: 88     Resp: 14     Temp: 98.2 F (36.8 C)   98.2 F (36.8 C)  TempSrc: Oral   Oral  SpO2: 98%     Weight:  80.6 kg    Height:       Weight change: -3 kg  Intake/Output Summary (Last 24 hours) at 07/27/2019 3893 Last data filed at 07/27/2019 0800 Gross per 24 hour  Intake 612.12 ml  Output 2500 ml  Net -1887.88 ml   Dialysis Orders: Center: [facility]  on mwf . EDW 79kg HD Bath 2k, 2ca  Time 4hr Heparin 7000.  Access Lij permcath      Calcitriol 0.5 mcg po/HD  Mircera 225 mcg q 2wks (last on 07/17/19)    Venofer  23m  qwkly     Assessment/ Plan: Pt is a 53y.o. yo male with ESRD and LVAD who was admitted on 07/22/2019 with gangrene of left leg  Assessment/Plan: 1. PAD-- s/p left AKA 11/17 per [facility], now bilat AKA-  no abx-  recovering - pain control has been issue. 2. ESRD - normally MWF-  [facility]-  Will be done Sunday - Tuesday and Friday - so next due tomorrow  3. Anemia-  Dropping with surgery - max ESA given 11/11 as OP- will give today as well - getting iron 4. Secondary hyperparathyroidism- on rocaltrol and renvela 5. HTN/volume- goal of 2 liters yest-  Sodium low which argues for some volume overload- also face is puffy and is over EDW.  Will need lower EDW at discharge.  Continue midodrine and challenge as able   KLouis Meckel   Labs: Basic Metabolic Panel: Recent Labs  Lab 07/25/19 0500 07/26/19 0300 07/27/19 0520  NA 133* 130* 138  K 4.0 4.2 3.5  CL 97* 93* 103  CO2 26 24 26   GLUCOSE 95 149* 292*  BUN 15 22* 22*  CREATININE 5.33* 7.08* 5.99*  CALCIUM 8.4* 8.4* 8.5*   Liver Function Tests: Recent Labs  Lab 07/22/19 1425  AST 18  ALT 14  ALKPHOS 112  BILITOT 0.6  PROT 7.8  ALBUMIN 2.7*    No results for input(s): LIPASE, AMYLASE in the last 168 hours. No results for input(s): AMMONIA in the last 168 hours. CBC: Recent Labs  Lab 07/24/19 0353 07/24/19 2055 07/25/19 0500 07/26/19 0300 07/27/19 0520  WBC 12.7* 10.8* 12.1* 11.8* 10.9*  HGB 8.7* 7.7* 7.9* 7.3* 7.8*  HCT 29.3* 26.4* 26.9* 24.6* 27.1*  MCV 86.2 86.8 88.5 86.3 88.3  PLT 243 160 193 213 264   Cardiac Enzymes: No results for input(s): CKTOTAL, CKMB, CKMBINDEX, TROPONINI in the last 168 hours. CBG: Recent Labs  Lab 07/26/19 0601 07/26/19 1151 07/26/19 1611 07/26/19 2154 07/27/19 0601  GLUCAP 128* 113* 178* 175* 298*    Iron Studies: No results for input(s): IRON, TIBC, TRANSFERRIN, FERRITIN in the last 72 hours. Studies/Results: No results found. Medications: Infusions: . sodium chloride    . ferric gluconate (FERRLECIT/NULECIT) IV 62.5 mg (07/24/19 1458)  . heparin 1,350 Units/hr (07/27/19 0800)  . magnesium sulfate bolus IVPB      Scheduled Medications: . aspirin EC  81 mg Oral Daily  . atorvastatin  40 mg Oral q1800  . calcitRIOL  0.5 mcg Oral Q M,W,F-HD  . Chlorhexidine Gluconate Cloth  6 each Topical Daily  . Chlorhexidine Gluconate Cloth  6 each Topical Q0600  . citalopram  20 mg Oral Daily  . darbepoetin (ARANESP) injection - DIALYSIS  200 mcg Intravenous Q Fri-HD  . docusate sodium  100 mg Oral Daily  . dronabinol  2.5 mg Oral BID AC  . erythromycin  250 mg Oral TID  . gabapentin  300 mg Oral BID  . heparin  1,000 Units Intravenous Q M,W,F-HD  . influenza vac split quadrivalent PF  0.5 mL Intramuscular Tomorrow-1000  . insulin aspart  0-6 Units Subcutaneous TID WC  . insulin glargine  8 Units Subcutaneous QHS  . metoCLOPramide  10 mg Oral TID WC  . midodrine  5 mg Oral Q M,W,F  . multivitamin  1 tablet Oral Daily  . pantoprazole  40 mg Oral BID AC  . pneumococcal 23 valent vaccine  0.5 mL Intramuscular Tomorrow-1000  . sevelamer carbonate  0.8 g Oral TID WC  . sodium  chloride flush  10-40 mL Intracatheter Q12H  . sodium chloride flush  3 mL Intravenous Q12H  . warfarin  7.5 mg Oral ONCE-1800  . Warfarin - Pharmacist Dosing Inpatient   Does not apply q1800    have reviewed scheduled and prn medications.  Physical Exam: General:  NAD Heart: LVAD Lungs: mostly clear- is on [place] Abdomen: soft, non tender  Extremities: post op Dialysis Access: left TDC     07/27/2019,9:17 AM  LOS: 5 days

## 2019-07-27 NOTE — Progress Notes (Signed)
[place] for Heparin  Indication: LVAD  Allergies  Allergen Reactions  . Metformin And Related Diarrhea    Patient Measurements: Height: 5' 8"  (172.7 cm) Weight: 177 lb 11.1 oz (80.6 kg) IBW/kg (Calculated) : 68.4 Heparin Dosing Weight: 82 kg  Vital Signs: Temp: 98.3 F (36.8 C) (11/21 1600) Temp Source: Oral (11/21 1600) BP: 97/79 (11/21 1600) Pulse Rate: 94 (11/21 1200)  Labs: Recent Labs    07/25/19 0500  07/26/19 0300 07/26/19 1240 07/27/19 0520 07/27/19 1709  HGB 7.9*  --  7.3*  --  7.8*  --   HCT 26.9*  --  24.6*  --  27.1*  --   PLT 193  --  213  --  264  --   LABPROT 14.9  --  16.1*  --  15.5*  --   INR 1.2  --  1.3*  --  1.2  --   HEPARINUNFRC  --    < > 0.34 0.38 0.17* 0.41  CREATININE 5.33*  --  7.08*  --  5.99*  --    < > = values in this interval not displayed.    Estimated Creatinine Clearance: 13.6 mL/min (A) (by C-G formula based on SCr of 5.99 mg/dL (H)).   Assessment: 33 YOM with LVAD, on chronic Coumadin admitted for L AKA. Pharmacy asked to dose Coumadin and start heparin bridge until INR 1.8.  Heparin level is therapeutic; no bleeding reported.  Goal of Therapy:  Heparin level 0.3-0.4 units/mL INR 2-3 Monitor platelets by anticoagulation protocol: Yes   Plan:  Continue heparin gtt at 1350 units/hr F/U AM labs  Lacreshia Bondarenko D. Mina Marble, PharmD, BCPS, [place] 07/27/2019, 5:52 PM

## 2019-07-27 NOTE — Progress Notes (Signed)
Patient ID: [name], male   DOB: 1965-12-09, 53 y.o.   MRN: [id number]     Advanced Heart Failure Rounding Note  PCP-Cardiologist: Dr. Aundra Dubin   Subjective:    S/p left AKA. POD#3. Pain improving.  Hgb 7.7 => 7.9 => 7.3 => 1 unit PRBCs => 7.8.    LVAD interrogation HM3:  Flow 4.4 L/min speed 5500 rpm PI 3.1 power 4.3.  No PI events.   Objective:   Weight Range: 80.6 kg Body mass index is 27.02 kg/m.   Vital Signs:   Temp:  [98.2 F (36.8 C)-99.3 F (37.4 C)] 98.2 F (36.8 C) (11/21 0815) Pulse Rate:  [78-97] 88 (11/21 0337) Resp:  [14-24] 14 (11/21 0337) BP: (100-127)/(78-104) 107/86 (11/21 0337) SpO2:  [92 %-100 %] 98 % (11/21 0337) Weight:  [79.7 kg-80.6 kg] 80.6 kg (11/21 0554) Last BM Date: 07/26/19  Weight change: Filed Weights   07/26/19 0705 07/26/19 1116 07/27/19 0554  Weight: 81.9 kg 79.7 kg 80.6 kg    Intake/Output:   Intake/Output Summary (Last 24 hours) at 07/27/2019 0824 Last data filed at 07/27/2019 0600 Gross per 24 hour  Intake 467.08 ml  Output 2500 ml  Net -2032.92 ml      Physical Exam  MAP 91 General: Well appearing this am. NAD.  HEENT: Normal. Neck: Supple, JVP 7-8 cm. Carotids OK.  Cardiac:  Mechanical heart sounds with LVAD hum present.  Lungs:  CTAB, normal effort.  Abdomen:  NT, ND, no HSM. No bruits or masses. +BS  LVAD exit site: Well-healed and incorporated. Dressing dry and intact. No erythema or drainage. Stabilization device present and accurately applied. Driveline dressing changed daily per sterile technique. Extremities:  S/p bilateral AKA  Neuro:  Alert & oriented x 3. Cranial nerves grossly intact. Moves all 4 extremities w/o difficulty. Affect pleasant     Telemetry   NSR 90s  EKG    No new EKG to review today   Labs    CBC Recent Labs    07/26/19 0300 07/27/19 0520  WBC 11.8* 10.9*  HGB 7.3* 7.8*  HCT 24.6* 27.1*  MCV 86.3 88.3  PLT 213 771   Basic Metabolic Panel Recent Labs   07/26/19 0300 07/27/19 0520  NA 130* 138  K 4.2 3.5  CL 93* 103  CO2 24 26  GLUCOSE 149* 292*  BUN 22* 22*  CREATININE 7.08* 5.99*  CALCIUM 8.4* 8.5*   Liver Function Tests No results for input(s): AST, ALT, ALKPHOS, BILITOT, PROT, ALBUMIN in the last 72 hours. No results for input(s): LIPASE, AMYLASE in the last 72 hours. Cardiac Enzymes No results for input(s): CKTOTAL, CKMB, CKMBINDEX, TROPONINI in the last 72 hours.  BNP: BNP (last 3 results) No results for input(s): BNP in the last 8760 hours.  ProBNP (last 3 results) No results for input(s): PROBNP in the last 8760 hours.   D-Dimer No results for input(s): DDIMER in the last 72 hours. Hemoglobin A1C Recent Labs    07/25/19 0500  HGBA1C 6.0*   Fasting Lipid Panel No results for input(s): CHOL, HDL, LDLCALC, TRIG, CHOLHDL, LDLDIRECT in the last 72 hours. Thyroid Function Tests No results for input(s): TSH, T4TOTAL, T3FREE, THYROIDAB in the last 72 hours.  Invalid input(s): FREET3  Other results:   Imaging    No results found.   Medications:     Scheduled Medications: . aspirin EC  81 mg Oral Daily  . atorvastatin  40 mg Oral q1800  . calcitRIOL  0.5  mcg Oral Q M,W,F-HD  . Chlorhexidine Gluconate Cloth  6 each Topical Daily  . Chlorhexidine Gluconate Cloth  6 each Topical Q0600  . citalopram  20 mg Oral Daily  . darbepoetin (ARANESP) injection - DIALYSIS  200 mcg Intravenous Q Fri-HD  . docusate sodium  100 mg Oral Daily  . dronabinol  2.5 mg Oral BID AC  . erythromycin  250 mg Oral TID  . gabapentin  300 mg Oral BID  . heparin  1,000 Units Intravenous Q M,W,F-HD  . influenza vac split quadrivalent PF  0.5 mL Intramuscular Tomorrow-1000  . insulin aspart  0-6 Units Subcutaneous TID WC  . insulin glargine  8 Units Subcutaneous QHS  . metoCLOPramide  10 mg Oral TID WC  . midodrine  5 mg Oral Q M,W,F  . multivitamin  1 tablet Oral Daily  . pantoprazole  40 mg Oral BID AC  . pneumococcal 23  valent vaccine  0.5 mL Intramuscular Tomorrow-1000  . sevelamer carbonate  0.8 g Oral TID WC  . sodium chloride flush  10-40 mL Intracatheter Q12H  . sodium chloride flush  3 mL Intravenous Q12H  . warfarin  7.5 mg Oral ONCE-1800  . Warfarin - Pharmacist Dosing Inpatient   Does not apply q1800    Infusions: . sodium chloride    . ferric gluconate (FERRLECIT/NULECIT) IV 62.5 mg (07/24/19 1458)  . heparin 1,250 Units/hr (07/27/19 0600)  . magnesium sulfate bolus IVPB      PRN Medications: sodium chloride, acetaminophen **OR** acetaminophen, docusate sodium, fentaNYL (SUBLIMAZE) injection, guaiFENesin-dextromethorphan, hydrALAZINE, labetalol, lidocaine, LORazepam, LORazepam, magnesium sulfate bolus IVPB, metoprolol tartrate, morphine injection, oxyCODONE-acetaminophen, phenol, promethazine, promethazine, sodium chloride flush, sodium chloride flush    Patient Profile   Mr Mayorquin is a 53 year old withCAD s/p anterior MI in 2010 and NSTEMI in 3/18 with DES to pRCA and mLAD, gastroparesis,ischemic cardiomyopathy now s/p Heartmate 3 LVAD and ESRD on HD, factor V leiden, PAF, and severe bilateral PAD s/p amputation of the 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 Now being readmitted for planned left AKA.  Assessment/Plan   1. Chronic Systolic CHF s/p HM3 LVAD: Ischemic cardiomyopathy. St Jude ICD. NSTEMI in March 2018 with DES to LAD and RCA, complicated by cardiogenic shock, low output requiring milrinone. Echo 8/18 with EF 20-25%, moderate MR. CPX (8/18) with severe functional impairment due to HF. He is s/p HM3 LVAD placement. He had pump thrombosis 6/19 due to [facility] outflow graft kinking. He had pump exchange for another HM3.Not a candidate for transplant with PAD.Volume status managed at HD. INR 1.2. LDH 164.  - Continue coumadin and ASA 81.  Continue heparin until INR 1.8.  2. 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 Left  foot failed to improve.  s/p left AKA this admit, POD #3. Wound site stable. Still with left stump pain but better.  - Oversedated with Fentanyl PCA.  PCA stopped and now trying to wean off IV Fentanyl onto Percocet.    3. ESRD: Will need HD Sunday and Tuesday because of holiday this week.  4.CAD: NSTEMI in 3/18. LHC with 99% ulcerated lesion proximal RCA with left to right collaterals, 95% mid LAD stenosis after mid LAD stent. s/p PCI to RCA and LAD on 11/25/16.Denies chest pain.  - Continue statin 5. H/o DVTs: Factor V Leiden heterozygote. - coumadin w/ heparin bridge.  6. Diabetic gastroparesis: Long history of this. -  on reglan 10 mg tid/ac and Marinol as outpatient  - Continue Protonix bid.  - He will continue erythromycinindefinitely.  - Limit narcotic use as much as possible. 7. HTN: has been controlled. - Requires midodrine w/ HD.  8. T2DM: continue home insulin regimen  9. Anemia: Post-op blood loss, hgb 7.8 today. Got 1 unit PRBCs on 11/20.   Plan for home when off heparin gtt.   Length of Stay: [place], [place]  07/27/2019, 8:24 AM  Advanced Heart Failure Team Pager [phone] (M-F; 7a - 4p)  Please contact [facility] for night-coverage after hours (4p -7a ) and weekends on [email or web address]

## 2019-07-27 NOTE — Progress Notes (Addendum)
[place] for Heparin  Indication: LVAD  Allergies  Allergen Reactions  . Metformin And Related Diarrhea    Patient Measurements: Height: 5' 8"  (172.7 cm) Weight: 177 lb 11.1 oz (80.6 kg) IBW/kg (Calculated) : 68.4 Heparin Dosing Weight: 82 kg  Vital Signs: Temp: 98.2 F (36.8 C) (11/21 0337) Temp Source: Oral (11/21 0337) BP: 107/86 (11/21 0337) Pulse Rate: 88 (11/21 0337)  Labs: Recent Labs    07/25/19 0500 07/25/19 1700 07/26/19 0300 07/26/19 1240 07/27/19 0520  HGB 7.9*  --  7.3*  --  7.8*  HCT 26.9*  --  24.6*  --  27.1*  PLT 193  --  213  --  264  LABPROT 14.9  --  16.1*  --   --   INR 1.2  --  1.3*  --   --   HEPARINUNFRC  --  0.22* 0.34 0.38  --   CREATININE 5.33*  --  7.08*  --  5.99*    Estimated Creatinine Clearance: 13.6 mL/min (A) (by C-G formula based on SCr of 5.99 mg/dL (H)).   Medical History: Past Medical History:  Diagnosis Date  . Angina   . ASCVD (arteriosclerotic cardiovascular disease)    , Anterior infarction 2005, LAD diagonal bifurcation intervention 03/2004  . Automatic implantable cardiac defibrillator -St. Jude's       . Benign neoplasm of colon   . CHF (congestive heart failure) ([place])   . Chronic systolic heart failure ([place])   . Coronary artery disease     Widely patent previously placed stents in the left anterior   . Crohn's disease ([place])   . 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 Dialysis patient ([place])   . Dyspnea   . Gastroparesis   . GERD (gastroesophageal reflux disease)   . High cholesterol   . Hyperlipidemia   . Hypersomnolent    Previous diagnosis of narcolepsy  . Hypertension, essential   . Ischemic cardiomyopathy    Ejection fraction 15-20% catheterization 2010  . Type II or unspecified type diabetes mellitus without mention of complication, not stated as uncontrolled   . Unspecified gastritis and gastroduodenitis without mention of hemorrhage      Assessment: 53 yo male with LVAD, on chronic Coumadin admitted for L AKA. Pharmacy asked to dose coumadin and start heparin bridge until INR 1.8.  Heparin level (0.17), low this morning on 1250 units/hr. INR still subtherapeutic. Hgb low trending up at 7.8. Plts wnl. LDH stable. No overt bleeding or infusion issues noted.  Goal of Therapy:  Heparin level 0.3-0.4 units/mL INR 2-3 Monitor platelets by anticoagulation protocol: Yes   Plan:  Increase heparin to 1350 units/hr -Has been relatively stable and do not want to be overly aggressive, will check level tonight and continue to aim for low goal. Warfarin 7.5 mg PO x1 Daily HL, PT-INR, CBC Monitor for signs of bleeding  Erin Hearing PharmD., BCPS Clinical Pharmacist 07/27/2019 7:43 AM  Please check [email or web address] for unit-specific pharmacy phone numbers.

## 2019-07-27 NOTE — Plan of Care (Signed)
  Problem: Education: Goal: Patient will be able to verbalize current INR target range and antiplatelet therapy for discharge home Outcome: Progressing   Problem: Education: Goal: Knowledge of the prescribed therapeutic regimen will improve Outcome: Progressing Goal: Ability to verbalize activity precautions or restrictions will improve Outcome: Progressing Goal: Understanding of discharge needs will improve Outcome: Progressing   Problem: Activity: Goal: Ability to perform//tolerate increased activity and mobilize with assistive devices will improve Outcome: Progressing   Problem: Clinical Measurements: Goal: Postoperative complications will be avoided or minimized Outcome: Progressing   Problem: Self-Care: Goal: Ability to meet self-care needs will improve Outcome: Progressing   Problem: Self-Concept: Goal: Ability to maintain and perform role responsibilities to the fullest extent possible will improve Outcome: Progressing   Problem: Pain Management: Goal: Pain level will decrease with appropriate interventions Outcome: Progressing   Problem: Skin Integrity: Goal: Demonstration of wound healing without infection will improve Outcome: Progressing   Problem: Education: Goal: Knowledge of General Education information will improve Description: Including pain rating scale, medication(s)/side effects and non-pharmacologic comfort measures Outcome: Progressing   Problem: Health Behavior/Discharge Planning: Goal: Ability to manage health-related needs will improve Outcome: Progressing   Problem: Clinical Measurements: Goal: Ability to maintain clinical measurements within normal limits will improve Outcome: Progressing Goal: Will remain free from infection Outcome: Progressing Goal: Diagnostic test results will improve Outcome: Progressing Goal: Respiratory complications will improve Outcome: Progressing Goal: Cardiovascular complication will be avoided Outcome:  Progressing   Problem: Activity: Goal: Risk for activity intolerance will decrease Outcome: Progressing   Problem: Nutrition: Goal: Adequate nutrition will be maintained Outcome: Progressing   Problem: Coping: Goal: Level of anxiety will decrease Outcome: Progressing   Problem: Elimination: Goal: Will not experience complications related to bowel motility Outcome: Progressing Goal: Will not experience complications related to urinary retention Outcome: Progressing   Problem: Pain Managment: Goal: General experience of comfort will improve Outcome: Progressing   Problem: Safety: Goal: Ability to remain free from injury will improve Outcome: Progressing   Problem: Skin Integrity: Goal: Risk for impaired skin integrity will decrease Outcome: Progressing

## 2019-07-28 LAB — RENAL FUNCTION PANEL
Albumin: 2.3 g/dL — ABNORMAL LOW (ref 3.5–5.0)
Anion gap: 13 (ref 5–15)
BUN: 37 mg/dL — ABNORMAL HIGH (ref 6–20)
CO2: 25 mmol/L (ref 22–32)
Calcium: 8.8 mg/dL — ABNORMAL LOW (ref 8.9–10.3)
Chloride: 100 mmol/L (ref 98–111)
Creatinine, Ser: 8.06 mg/dL — ABNORMAL HIGH (ref 0.61–1.24)
GFR calc Af Amer: 8 mL/min — ABNORMAL LOW (ref >60)
GFR calc non Af Amer: 7 mL/min — ABNORMAL LOW (ref >60)
Glucose, Bld: 161 mg/dL — ABNORMAL HIGH (ref 70–99)
Phosphorus: 4.9 mg/dL — ABNORMAL HIGH (ref 2.5–4.6)
Potassium: 3.8 mmol/L (ref 3.5–5.1)
Sodium: 138 mmol/L (ref 135–145)

## 2019-07-28 LAB — GLUCOSE, CAPILLARY
Glucose-Capillary: 184 mg/dL — ABNORMAL HIGH (ref 70–99)
Glucose-Capillary: 155 mg/dL — ABNORMAL HIGH (ref 70–99)
Glucose-Capillary: 183 mg/dL — ABNORMAL HIGH (ref 70–99)

## 2019-07-28 LAB — CBC
HCT: 26.9 % — ABNORMAL LOW (ref 39.0–52.0)
Hemoglobin: 7.6 g/dL — ABNORMAL LOW (ref 13.0–17.0)
MCH: 25.4 pg — ABNORMAL LOW (ref 26.0–34.0)
MCHC: 28.3 g/dL — ABNORMAL LOW (ref 30.0–36.0)
MCV: 90 fL (ref 80.0–100.0)
Platelets: 299 10*3/uL (ref 150–400)
RBC: 2.99 MIL/uL — ABNORMAL LOW (ref 4.22–5.81)
RDW: 17.4 % — ABNORMAL HIGH (ref 11.5–15.5)
WBC: 14.6 10*3/uL — ABNORMAL HIGH (ref 4.0–10.5)
nRBC: 1.8 % — ABNORMAL HIGH (ref 0.0–0.2)

## 2019-07-28 LAB — PROTIME-INR
INR: 1.5 — ABNORMAL HIGH (ref 0.8–1.2)
Prothrombin Time: 18.1 seconds — ABNORMAL HIGH (ref 11.4–15.2)

## 2019-07-28 LAB — LACTATE DEHYDROGENASE: LDH: 215 U/L — ABNORMAL HIGH (ref 98–192)

## 2019-07-28 LAB — HEPARIN LEVEL (UNFRACTIONATED): Heparin Unfractionated: 0.35 IU/mL (ref 0.30–0.70)

## 2019-07-28 MED ORDER — DOCUSATE SODIUM 100 MG PO CAPS: 100.0000 mg | ORAL_CAPSULE | Freq: Every day | ORAL | Status: DC | PRN

## 2019-07-28 MED ORDER — HEPARIN SODIUM (PORCINE) 1000 UNIT/ML IJ SOLN
INTRAMUSCULAR | Status: AC
  Filled 2019-07-28: qty 1

## 2019-07-28 MED ORDER — WARFARIN SODIUM 7.5 MG PO TABS
7.5000 mg | ORAL_TABLET | Freq: Once | ORAL | Status: AC
  Administered 2019-07-28: 7.5 mg via ORAL
  Filled 2019-07-28 (×2): qty 1

## 2019-07-28 MED ORDER — HEPARIN SODIUM (PORCINE) 1000 UNIT/ML IJ SOLN
INTRAMUSCULAR | Status: AC
  Filled 2019-07-28: qty 4

## 2019-07-28 MED ORDER — MIDODRINE HCL 5 MG PO TABS
ORAL_TABLET | ORAL | Status: AC
  Filled 2019-07-28: qty 1

## 2019-07-28 MED ORDER — HEPARIN SODIUM (PORCINE) 1000 UNIT/ML DIALYSIS: 20.0000 [IU]/kg | INTRAMUSCULAR | Status: DC | PRN

## 2019-07-28 NOTE — Progress Notes (Signed)
[place] for Heparin  Indication: LVAD  Allergies  Allergen Reactions  . Metformin And Related Diarrhea    Patient Measurements: Height: 5' 8"  (172.7 cm) Weight: 183 lb 10.3 oz (83.3 kg) IBW/kg (Calculated) : 68.4 Heparin Dosing Weight: 82 kg  Vital Signs: Temp: 98.5 F (36.9 C) (11/22 0429) Temp Source: Oral (11/22 0429) BP: 103/79 (11/22 0429) Pulse Rate: 87 (11/22 0429)  Labs: Recent Labs    07/26/19 0300  07/27/19 0520 07/27/19 1709 07/28/19 0234 07/28/19 0724  HGB 7.3*  --  7.8*  --   --  7.6*  HCT 24.6*  --  27.1*  --   --  26.9*  PLT 213  --  264  --   --  299  LABPROT 16.1*  --  15.5*  --  18.1*  --   INR 1.3*  --  1.2  --  1.5*  --   HEPARINUNFRC 0.34   < > 0.17* 0.41 0.35  --   CREATININE 7.08*  --  5.99*  --   --   --    < > = values in this interval not displayed.    Estimated Creatinine Clearance: 14.8 mL/min (A) (by C-G formula based on SCr of 5.99 mg/dL (H)).   Assessment: 46 YOM with LVAD, on chronic Coumadin admitted for L AKA. Pharmacy asked to dose Coumadin and start heparin bridge until INR 1.8.  Heparin level continues to be therapeutic; no bleeding reported. INR trending up to 1.5.   Goal of Therapy:  Heparin level 0.3-0.4 units/mL INR 2-3 Monitor platelets by anticoagulation protocol: Yes   Plan:  Continue heparin gtt at 1350 units/hr Will give 7.98m one more day, expect INR to be above 1.8 to stop heparin bridge. Will then try and transition back to home dose  FErin HearingPharmD., BCPS Clinical Pharmacist 07/28/2019 7:44 AM

## 2019-07-28 NOTE — Progress Notes (Signed)
Patient ID: [name], male   DOB: Jan 05, 1966, 53 y.o.   MRN: [id number]     Advanced Heart Failure Rounding Note  PCP-Cardiologist: Dr. Aundra Dubin   Subjective:    S/p left AKA. POD#3. Pain improving.  Hgb 7.7 => 7.9 => 7.3 => 1 unit PRBCs => 7.8 => 7.6.    INR up to 1.5, on heparin gtt.  No surgical site bleeding.    Seen at HD.   LVAD interrogation HM3:  Flow 3.9 L/min speed 5500 rpm PI 5 power 4.4.    Objective:   Weight Range: 82.5 kg Body mass index is 27.65 kg/m.   Vital Signs:   Temp:  [98.3 F (36.8 C)-99.4 F (37.4 C)] 99.4 F (37.4 C) (11/22 0654) Pulse Rate:  [59-98] 85 (11/22 1000) Resp:  [7-20] 19 (11/22 1000) BP: (82-108)/(45-94) 105/66 (11/22 1000) SpO2:  [93 %-100 %] 100 % (11/22 0654) Weight:  [82.5 kg-83.3 kg] 82.5 kg (11/22 0654) Last BM Date: 07/27/19  Weight change: Filed Weights   07/27/19 0554 07/28/19 0429 07/28/19 0654  Weight: 80.6 kg 83.3 kg 82.5 kg    Intake/Output:   Intake/Output Summary (Last 24 hours) at 07/28/2019 1033 Last data filed at 07/28/2019 0440 Gross per 24 hour  Intake 893.58 ml  Output 0 ml  Net 893.58 ml      Physical Exam  MAP 80s General: Well appearing this am. NAD.  HEENT: Normal. Neck: Supple, JVP 7-8 cm. Carotids OK.  Cardiac:  Mechanical heart sounds with LVAD hum present.  Lungs:  CTAB, normal effort.  Abdomen:  NT, ND, no HSM. No bruits or masses. +BS  LVAD exit site: Well-healed and incorporated. Dressing dry and intact. No erythema or drainage. Stabilization device present and accurately applied. Driveline dressing changed daily per sterile technique. Extremities:  S/p bilateral AKA.   Neuro:  Alert & oriented x 3. Cranial nerves grossly intact. Moves all 4 extremities w/o difficulty. Affect pleasant     Telemetry   NSR 90s  EKG    No new EKG to review today   Labs    CBC Recent Labs    07/27/19 0520 07/28/19 0724  WBC 10.9* 14.6*  HGB 7.8* 7.6*  HCT 27.1* 26.9*  MCV 88.3  90.0  PLT 264 867   Basic Metabolic Panel Recent Labs    07/27/19 0520 07/28/19 0723  NA 138 138  K 3.5 3.8  CL 103 100  CO2 26 25  GLUCOSE 292* 161*  BUN 22* 37*  CREATININE 5.99* 8.06*  CALCIUM 8.5* 8.8*  PHOS  --  4.9*   Liver Function Tests Recent Labs    07/28/19 0723  ALBUMIN 2.3*   No results for input(s): LIPASE, AMYLASE in the last 72 hours. Cardiac Enzymes No results for input(s): CKTOTAL, CKMB, CKMBINDEX, TROPONINI in the last 72 hours.  BNP: BNP (last 3 results) No results for input(s): BNP in the last 8760 hours.  ProBNP (last 3 results) No results for input(s): PROBNP in the last 8760 hours.   D-Dimer No results for input(s): DDIMER in the last 72 hours. Hemoglobin A1C No results for input(s): HGBA1C in the last 72 hours. Fasting Lipid Panel No results for input(s): CHOL, HDL, LDLCALC, TRIG, CHOLHDL, LDLDIRECT in the last 72 hours. Thyroid Function Tests No results for input(s): TSH, T4TOTAL, T3FREE, THYROIDAB in the last 72 hours.  Invalid input(s): FREET3  Other results:   Imaging    No results found.   Medications:  Scheduled Medications: . aspirin EC  81 mg Oral Daily  . atorvastatin  40 mg Oral q1800  . calcitRIOL  0.5 mcg Oral Q M,W,F-HD  . Chlorhexidine Gluconate Cloth  6 each Topical Daily  . Chlorhexidine Gluconate Cloth  6 each Topical Q0600  . citalopram  20 mg Oral Daily  . darbepoetin (ARANESP) injection - DIALYSIS  200 mcg Intravenous Q Fri-HD  . docusate sodium  100 mg Oral Daily  . dronabinol  2.5 mg Oral BID AC  . erythromycin  250 mg Oral TID  . gabapentin  300 mg Oral BID  . heparin      . heparin  1,000 Units Intravenous Q M,W,F-HD  . influenza vac split quadrivalent PF  0.5 mL Intramuscular Tomorrow-1000  . insulin aspart  0-6 Units Subcutaneous TID WC  . insulin glargine  8 Units Subcutaneous QHS  . metoCLOPramide  10 mg Oral TID WC  . midodrine  5 mg Oral Q M,W,F  . multivitamin  1 tablet Oral Daily   . pantoprazole  40 mg Oral BID AC  . pneumococcal 23 valent vaccine  0.5 mL Intramuscular Tomorrow-1000  . sevelamer carbonate  0.8 g Oral TID WC  . sodium chloride flush  10-40 mL Intracatheter Q12H  . sodium chloride flush  3 mL Intravenous Q12H  . warfarin  7.5 mg Oral ONCE-1800  . Warfarin - Pharmacist Dosing Inpatient   Does not apply q1800    Infusions: . sodium chloride    . ferric gluconate (FERRLECIT/NULECIT) IV 62.5 mg (07/24/19 1458)  . heparin 1,350 Units/hr (07/27/19 2218)  . magnesium sulfate bolus IVPB      PRN Medications: sodium chloride, acetaminophen **OR** acetaminophen, docusate sodium, fentaNYL (SUBLIMAZE) injection, guaiFENesin-dextromethorphan, heparin, hydrALAZINE, labetalol, lidocaine, LORazepam, LORazepam, magnesium sulfate bolus IVPB, metoprolol tartrate, morphine injection, oxyCODONE-acetaminophen, phenol, promethazine, promethazine, sodium chloride flush, sodium chloride flush    Patient Profile   Mr Alberico is a 53 year old withCAD s/p anterior MI in 2010 and NSTEMI in 3/18 with DES to pRCA and mLAD, gastroparesis,ischemic cardiomyopathy now s/p Heartmate 3 LVAD and ESRD on HD, factor V leiden, PAF, and severe bilateral PAD s/p amputation of the 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 Now being readmitted for planned left AKA.  Assessment/Plan   1. Chronic Systolic CHF s/p HM3 LVAD: Ischemic cardiomyopathy. St Jude ICD. NSTEMI in March 2018 with DES to LAD and RCA, complicated by cardiogenic shock, low output requiring milrinone. Echo 8/18 with EF 20-25%, moderate MR. CPX (8/18) with severe functional impairment due to HF. He is s/p HM3 LVAD placement. He had pump thrombosis 6/19 due to [facility] outflow graft kinking. He had pump exchange for another HM3.Not a candidate for transplant with PAD.Volume status managed at HD. INR 1.5. LDH 215.  - Continue coumadin and ASA 81.  Continue heparin until INR 1.8.  2. 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 Left foot failed to improve.  s/p left AKA this admit, POD #3. Wound site stable. Still with left stump pain but better. Oversedated with Fentanyl PCA.  PCA stopped and now trying to wean off IV Fentanyl onto Percocet.  He is now only taking po pain medication.  3. ESRD: Will need HD Sunday and Tuesday because of holiday this week.  4.CAD: NSTEMI in 3/18. LHC with 99% ulcerated lesion proximal RCA with left to right collaterals, 95% mid LAD stenosis after mid LAD  stent. s/p PCI to RCA and LAD on 11/25/16.Denies chest pain.  - Continue statin 5. H/o DVTs: Factor V Leiden heterozygote. - coumadin w/ heparin bridge.  6. Diabetic gastroparesis: Long history of this. - on reglan 10 mg tid/ac and Marinol as outpatient  - Continue Protonix bid.  - He will continue erythromycinindefinitely.  - Limit narcotic use as much as possible. 7. HTN: has been controlled. - Requires midodrine w/ HD.  8. T2DM: continue home insulin regimen  9. Anemia: Post-op blood loss, hgb 7.6 today. Got 1 unit PRBCs on 11/20.   Plan for home when off heparin gtt.   Length of Stay: 6  Loralie Champagne, MD  07/28/2019, 10:33 AM  Advanced Heart Failure Team Pager [phone] (M-F; 7a - 4p)  Please contact [facility] for night-coverage after hours (4p -7a ) and weekends on [email or web address]

## 2019-07-28 NOTE — Progress Notes (Signed)
Subjective:    No new c/o's  - seen on HD  Objective Vital signs in last 24 hours: Vitals:   07/28/19 0800 07/28/19 0830 07/28/19 0900 07/28/19 0930  BP: (!) 108/45 92/75 105/78 103/84  Pulse: 62 84 88 89  Resp: 19 13 17 20   Temp:      TempSrc:      SpO2:      Weight:      Height:       Weight change: 1.4 kg  Intake/Output Summary (Last 24 hours) at 07/28/2019 0951 Last data filed at 07/28/2019 0440 Gross per 24 hour  Intake 893.58 ml  Output 0 ml  Net 893.58 ml   Dialysis Orders: Center: [facility]  on mwf . EDW 79kg HD Bath 2k, 2ca  Time 4hr Heparin 7000.  Access Lij permcath      Calcitriol 0.5 mcg po/HD  Mircera 225 mcg q 2wks (last on 07/17/19)    Venofer  71m  qwkly     Assessment/ Plan: Pt is a 53y.o. yo male with ESRD and LVAD who was admitted on 07/22/2019 with gangrene of left leg  Assessment/Plan: 1. PAD-- s/p left AKA 11/17 per [facility], now bilat AKA-  no abx-  recovering - pain control has been issue. 2. ESRD - normally MWF-  [facility] next week is holiday schedule-  Will be done Sunday - Tuesday and Friday - so next due Tuesday   3. Anemia-  Dropping with surgery - max ESA given 11/11 as OP- gave here as well - getting iron 4. Secondary hyperparathyroidism- on rocaltrol and renvela 5. HTN/volume- goal of 2 liters Fri- going for 2.5 today -  Sodium low which argues for some volume overload- also face is puffy and is over EDW.  Will need lower EDW at discharge.  Continue midodrine and challenge as able   KLouis Meckel   Labs: Basic Metabolic Panel: Recent Labs  Lab 07/26/19 0300 07/27/19 0520 07/28/19 0723  NA 130* 138 138  K 4.2 3.5 3.8  CL 93* 103 100  CO2 24 26 25   GLUCOSE 149* 292* 161*  BUN 22* 22* 37*  CREATININE 7.08* 5.99* 8.06*  CALCIUM 8.4* 8.5* 8.8*  PHOS  --   --  4.9*   Liver Function Tests: Recent Labs  Lab 07/22/19 1425 07/28/19 0723  AST 18  --   ALT 14  --   ALKPHOS 112  --   BILITOT 0.6  --   PROT  7.8  --   ALBUMIN 2.7* 2.3*   No results for input(s): LIPASE, AMYLASE in the last 168 hours. No results for input(s): AMMONIA in the last 168 hours. CBC: Recent Labs  Lab 07/24/19 2055 07/25/19 0500 07/26/19 0300 07/27/19 0520 07/28/19 0724  WBC 10.8* 12.1* 11.8* 10.9* 14.6*  HGB 7.7* 7.9* 7.3* 7.8* 7.6*  HCT 26.4* 26.9* 24.6* 27.1* 26.9*  MCV 86.8 88.5 86.3 88.3 90.0  PLT 160 193 213 264 299   Cardiac Enzymes: No results for input(s): CKTOTAL, CKMB, CKMBINDEX, TROPONINI in the last 168 hours. CBG: Recent Labs  Lab 07/27/19 0601 07/27/19 1109 07/27/19 1613 07/27/19 2131 07/28/19 0622  GLUCAP 298* 213* 154* 283* 184*    Iron Studies: No results for input(s): IRON, TIBC, TRANSFERRIN, FERRITIN in the last 72 hours. Studies/Results: No results found. Medications: Infusions: . sodium chloride    . ferric gluconate (FERRLECIT/NULECIT) IV 62.5 mg (07/24/19 1458)  . heparin 1,350 Units/hr (07/27/19 2218)  .  magnesium sulfate bolus IVPB      Scheduled Medications: . aspirin EC  81 mg Oral Daily  . atorvastatin  40 mg Oral q1800  . calcitRIOL  0.5 mcg Oral Q M,W,F-HD  . Chlorhexidine Gluconate Cloth  6 each Topical Daily  . Chlorhexidine Gluconate Cloth  6 each Topical Q0600  . citalopram  20 mg Oral Daily  . darbepoetin (ARANESP) injection - DIALYSIS  200 mcg Intravenous Q Fri-HD  . docusate sodium  100 mg Oral Daily  . dronabinol  2.5 mg Oral BID AC  . erythromycin  250 mg Oral TID  . gabapentin  300 mg Oral BID  . heparin      . heparin  1,000 Units Intravenous Q M,W,F-HD  . influenza vac split quadrivalent PF  0.5 mL Intramuscular Tomorrow-1000  . insulin aspart  0-6 Units Subcutaneous TID WC  . insulin glargine  8 Units Subcutaneous QHS  . metoCLOPramide  10 mg Oral TID WC  . midodrine  5 mg Oral Q M,W,F  . multivitamin  1 tablet Oral Daily  . pantoprazole  40 mg Oral BID AC  . pneumococcal 23 valent vaccine  0.5 mL Intramuscular Tomorrow-1000  . sevelamer  carbonate  0.8 g Oral TID WC  . sodium chloride flush  10-40 mL Intracatheter Q12H  . sodium chloride flush  3 mL Intravenous Q12H  . warfarin  7.5 mg Oral ONCE-1800  . Warfarin - Pharmacist Dosing Inpatient   Does not apply q1800    have reviewed scheduled and prn medications.  Physical Exam: General:  NAD Heart: LVAD Lungs: mostly clear- is on [place] Abdomen: soft, non tender  Extremities: post op Dialysis Access: left TDC     07/28/2019,9:51 AM  LOS: 6 days

## 2019-07-29 LAB — CBC
HCT: 29.9 % — ABNORMAL LOW (ref 39.0–52.0)
Hemoglobin: 8.5 g/dL — ABNORMAL LOW (ref 13.0–17.0)
MCH: 25.4 pg — ABNORMAL LOW (ref 26.0–34.0)
MCHC: 28.4 g/dL — ABNORMAL LOW (ref 30.0–36.0)
MCV: 89.5 fL (ref 80.0–100.0)
Platelets: 311 10*3/uL (ref 150–400)
RBC: 3.34 MIL/uL — ABNORMAL LOW (ref 4.22–5.81)
RDW: 18 % — ABNORMAL HIGH (ref 11.5–15.5)
WBC: 14.1 10*3/uL — ABNORMAL HIGH (ref 4.0–10.5)
nRBC: 2.6 % — ABNORMAL HIGH (ref 0.0–0.2)

## 2019-07-29 LAB — HEPARIN LEVEL (UNFRACTIONATED): Heparin Unfractionated: 0.32 IU/mL (ref 0.30–0.70)

## 2019-07-29 LAB — GLUCOSE, CAPILLARY
Glucose-Capillary: 93 mg/dL (ref 70–99)
Glucose-Capillary: 130 mg/dL — ABNORMAL HIGH (ref 70–99)

## 2019-07-29 LAB — PROTIME-INR
INR: 2 — ABNORMAL HIGH (ref 0.8–1.2)
Prothrombin Time: 22.3 seconds — ABNORMAL HIGH (ref 11.4–15.2)

## 2019-07-29 LAB — LACTATE DEHYDROGENASE: LDH: 204 U/L — ABNORMAL HIGH (ref 98–192)

## 2019-07-29 MED ORDER — WARFARIN SODIUM 5 MG PO TABS
5.0000 mg | ORAL_TABLET | Freq: Once | ORAL | Status: AC
  Administered 2019-07-29: 5 mg via ORAL
  Filled 2019-07-29: qty 1

## 2019-07-29 MED ORDER — OXYCODONE-ACETAMINOPHEN 5-325 MG PO TABS
1.0000 | ORAL_TABLET | ORAL | Status: DC | PRN
  Administered 2019-07-29 – 2019-07-30 (×5): 1 via ORAL
  Filled 2019-07-29 (×4): qty 1

## 2019-07-29 MED ORDER — CHLORHEXIDINE GLUCONATE CLOTH 2 % EX PADS
6.0000 | MEDICATED_PAD | Freq: Every day | CUTANEOUS | Status: DC
  Administered 2019-07-30: 6 via TOPICAL

## 2019-07-29 NOTE — Progress Notes (Signed)
Physical Therapy Treatment Patient Details Name: [name] MRN: [id number] DOB: [birth year] Today's Date: 07/29/2019    History of Present Illness 53 y.o. 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 Pt underwent L AKA on 07/23/2019.    PT Comments    Patient seen for mobility progression. Pt requires assistance for placement of slide board and for sitting balance with dynamic tasks. Pt will benefit from further skilled PT services to maximize independence and safety with mobility.   Follow Up Recommendations  Supervision/Assistance - 24 hour;Home health PT     Equipment Recommendations  None recommended by PT  (Pt asking about power w/c. Defer to [facility] therapies for assessment)   Recommendations for Other Services       Precautions / Restrictions Precautions Precautions: Fall;Other (comment) Precaution Comments: LVAD, B AKA, NWB B LE     Mobility  Bed Mobility Overal bed mobility: Modified Independent Bed Mobility: Supine to Sit           General bed mobility comments: use of bed rails  Transfers Overall transfer level: Needs assistance Equipment used: Sliding board Transfers: Lateral/Scoot Transfers          Lateral/Scoot Transfers: Min assist;With slide board General transfer comment: assistance to place board and to remove board   Ambulation/Gait                 Stairs         General stair comments: pt reports having a ramp to enter home   Wheelchair Mobility    Modified Rankin (Stroke Patients Only)       Balance Overall balance assessment: Needs assistance Sitting-balance support: Bilateral upper extremity supported Sitting balance-[name]: Fair Sitting balance - Comments: Pt able to maintain static sitting with supervision.  He lost balance posteriorly with dynamic tasks                                      Cognition Arousal/Alertness: Awake/alert Behavior During Therapy: WFL for tasks assessed/performed;Flat affect Overall Cognitive Status: Within Functional Limits for tasks assessed                                 General Comments: Pt very animated this date and jovial.  He demonstrates good problem solving skills and good processing speed       Exercises Other Exercises Other Exercises: Pt performed 6 reps chair pushups and fatigued     General Comments General comments (skin integrity, edema, etc.): VSS.  Pt inquiring about a power w/c - explained process to pt. Pt reports he was going to [place] before the center closed due to [place] pandemic in the spring, and has not had any therapy since that time.   He fatigues with activity       Pertinent Vitals/Pain Pain Assessment: Faces Faces Pain Scale: Hurts a little bit Pain Location: L AKA Pain Descriptors / Indicators: Sore;Discomfort Pain Intervention(s): Monitored during session    Home Living                      Prior Function            PT Goals (current goals can now be found in the  care plan section) Progress towards PT goals: Progressing toward goals    Frequency    Min 3X/week      PT Plan Current plan remains appropriate    Co-evaluation PT/OT/SLP Co-Evaluation/Treatment: Yes Reason for Co-Treatment: For patient/therapist safety;To address functional/ADL transfers PT goals addressed during session: Mobility/safety with mobility OT goals addressed during session: ADL's and self-care;Strengthening/ROM      AM-PAC PT "6 Clicks" Mobility   Outcome Measure  Help needed turning from your back to your side while in a flat bed without using bedrails?: None Help needed moving from lying on your back to sitting on the side of a flat bed without using bedrails?: A Little Help needed moving to and from a bed to a chair (including a wheelchair)?: A Little Help needed  standing up from a chair using your arms (e.g., wheelchair or bedside chair)?: Total Help needed to walk in hospital room?: Total Help needed climbing 3-5 steps with a railing? : Total 6 Click Score: 13    End of Session   Activity Tolerance: Patient tolerated treatment well Patient left: in chair;with call bell/phone within reach Nurse Communication: Mobility status PT Visit Diagnosis: Other abnormalities of gait and mobility (R26.89) Pain - Right/Left: Left Pain - part of body: Leg     Time: 5868-2574 PT Time Calculation (min) (ACUTE ONLY): 28 min  Charges:  $Therapeutic Activity: 8-22 mins                     Earney Navy, PTA Acute Rehabilitation Services Pager: [phone] Office: [phone]     Darliss Cheney 07/29/2019, 1:49 PM

## 2019-07-29 NOTE — Discharge Summary (Signed)
Advanced Heart Failure Team  Discharge Summary   Patient ID: [name] MRN: [id number], DOB/AGE: 05-Jun-1966 53 y.o. Admit date: 07/22/2019 D/C date:     07/30/2019   Primary Discharge Diagnoses:  1. Chronic Systolic CHF s/p HM3 LVAD  On ASA + coumadin. Speed 5500 2. PAD w/ Ischemic Left Foot: s/p left AKA this admit 3. ESRD 4.CAD 5.H/o DVTs 6. Diabetic gastroparesis 7. HTN 8. T2DM 9. Anemia  Hospital Course: [name] is a 53 year old withCAD s/p anterior MI in 2010 and NSTEMI in 3/18 with DES to pRCA and mLAD, gastroparesis,ischemic cardiomyopathy, Heartmate 3 LVAD, ESRD, factor V leiden, PAF, chronic anticoagulation on coumadin, severe PAD, and RBKA.   Admitted for scheduled left AKA and heparin bridge. On 07/23/19 he underwent left AKA. Post operative course complicated by oversedation with narcotics and received naracan. Narcotics adjusted. He was given tramadol prescription. May need referral to outpatient pain clinic.  He will continue outpatient HD.   His family plans to take him to dialysis and will use slide board to help with transfers.   On 07/29/19, goals of care meeting was completed with him and his family.  See below for detailed problems list. He will contine to be followed closely in the VAD clinic. CM consulted for HHOT/PT. [facility] will follow.   Plan on goals of care meeting next week with family.     1. Chronic Systolic CHF s/p HM3 LVAD:Ischemic cardiomyopathy. St Jude ICD. NSTEMI in March 2018 with DES to LAD and RCA, complicated by cardiogenic shock, low output requiring milrinone. Echo 8/18 with EF 20-25%, moderate [name]. CPX (8/18) with severe functional impairment due to HF. He is s/p HM3 LVAD placement. He had pump thrombosis 6/19 due to [facility] outflow graft kinking. He had pump exchange for another HM3.Not a candidate for transplant with PAD.Volume status managed at HD. Placed on heparin drip until INR was therapeutic.  Coumadin dose was followed closely and adjusted by pharmacy. He has home INR machine and will check INR on Monday with reading reported to HF Pharmacist.   - Continue coumadin and ASA 81.  2. PAD w/ Ischemic Left Foot: s/pamputation of the left great 9/23, followed byRt BKA on 9/27.Left foot failed to improve.  s/p left AKA this admit. LBKA staples approximated. Plan to continue  Oxycodone but have discussed using tramadol first.   3. ESRD: Nephrology consulted and followed by HD.   4.CAD: NSTEMI in 3/18. LHC with 99% ulcerated lesion proximal RCA with left to right collaterals, 95% mid LAD stenosis after mid LAD stent. s/p PCI to RCA and LAD on 11/25/16.Denieschest pain.  - Continue statin 5.H/o DVTs:Factor V Leiden heterozygote. - Continue coumadin .  6. Diabetic gastroparesis:Long history of this. -onreglan 10 mg tid/ac and Marinol as outpatient - Continue Protonix bid.  - He will continue erythromycinindefinitely.  - Limit narcotic use as much as possible. 7. EML:JQGB 90s.Currently off hydralazine. May need to restart as an outpatien.  - Requires midodrine w/ HD. 8. T2DM: continue home insulin regimen  9. Anemia: Post-op blood loss.   Got 1 unit PRBCs on 11/20.     LVAD Interrogation HM 3:   Speed:   5500  Flow:  4.6    PI: 2.4       Power:   4    Back-up  speed:  2010      Discharge Vitals: Blood pressure (!) 99/52, pulse 76, temperature 98.8 F (37.1 C), temperature source Oral, resp.  rate 13, height 5' 8"  (1.727 m), weight 81.5 kg, SpO2 100 %.  Labs: Lab Results  Component Value Date   WBC 10.9 (H) 07/30/2019   HGB 8.1 (L) 07/30/2019   HCT 28.3 (L) 07/30/2019   MCV 91.3 07/30/2019   PLT 313 07/30/2019    Recent Labs  Lab 07/30/19 0500  NA 134*  K 4.6  CL 96*  CO2 26  BUN 37*  CREATININE 7.98*  CALCIUM 9.1  GLUCOSE 147*   Lab Results  Component Value Date   CHOL 128 05/04/2017   HDL 50 05/04/2017   LDLCALC 54 05/04/2017   TRIG 118  05/04/2017   BNP (last 3 results) No results for input(s): BNP in the last 8760 hours.  ProBNP (last 3 results) No results for input(s): PROBNP in the last 8760 hours.   Diagnostic Studies/Procedures   No results found.  Discharge Medications   Allergies as of 07/30/2019      Reactions   Metformin And Related Diarrhea      Medication List    STOP taking these medications   acetaminophen 325 MG tablet Commonly known as: TYLENOL     TAKE these medications   aspirin EC 81 MG tablet Take 1 tablet (81 mg total) by mouth daily.   atorvastatin 40 MG tablet Commonly known as: LIPITOR Take 1 tablet (40 mg total) by mouth daily at 6 PM.   calcitRIOL 0.5 MCG capsule Commonly known as: ROCALTROL Take 1 capsule (0.5 mcg total) by mouth every Monday, Wednesday, and Friday.   citalopram 20 MG tablet Commonly known as: CELEXA Take 1 tablet (20 mg total) by mouth daily.   docusate sodium 100 MG capsule Commonly known as: COLACE Take 1 capsule (100 mg total) by mouth daily as needed for mild constipation. What changed: when to take this   dronabinol 2.5 MG capsule Commonly known as: MARINOL Take 1 capsule (2.5 mg total) by mouth 2 (two) times daily before lunch and supper.   erythromycin 250 MG tablet Commonly known as: ERYTHROCIN Take 250 mg by mouth 3 (three) times daily.   gabapentin 300 MG capsule Commonly known as: NEURONTIN Take 1 capsule (300 mg total) by mouth 2 (two) times daily.   insulin glargine 100 UNIT/ML injection Commonly known as: LANTUS Inject 0.1 mLs (10 Units total) into the skin daily. What changed: when to take this   LORazepam 0.5 MG tablet Commonly known as: ATIVAN Take 1 tablet (0.5 mg total) by mouth every 12 (twelve) hours as needed (nausea).   metoCLOPramide 10 MG tablet Commonly known as: REGLAN Take 1 tablet (10 mg total) by mouth 3 (three) times daily. What changed: when to take this   midodrine 5 MG tablet Commonly known as:  PROAMATINE Take 1 tablet (5 mg total) by mouth daily. Take 1 tablet before dialysis treatment What changed: when to take this   multivitamin Tabs tablet Take 1 tablet by mouth daily.   NovoLOG 100 UNIT/ML injection Generic drug: insulin aspart INJECT 7 UNITS INTO THE SKIN DAILY WITH SUPPER. What changed: See the new instructions.   oxyCODONE 5 MG immediate release tablet Commonly known as: Roxicodone Take 1 tablet (5 mg total) by mouth 2 (two) times daily as needed for severe pain. Take 1-2 tablets every 4-6 hours for severe pain.   pantoprazole 40 MG tablet Commonly known as: Protonix Take 1 tablet (40 mg total) by mouth 2 (two) times daily before lunch and supper.   promethazine 12.5 MG tablet Commonly  known as: PHENERGAN Take 1 tablet (12.5 mg total) by mouth every 6 (six) hours as needed for nausea or vomiting.   sevelamer carbonate 0.8 g Pack packet Commonly known as: RENVELA Take 0.8 g by mouth 3 (three) times daily with meals.   traMADol 50 MG tablet Commonly known as: Ultram Take 1 tablet (50 mg total) by mouth every 6 (six) hours as needed.   warfarin 5 MG tablet Commonly known as: Coumadin Take as directed. If you are unsure how to take this medication, talk to your nurse or doctor. Original instructions: Take 1 tablet (5 mg total) by mouth daily. What changed:   how much to take  how to take this  when to take this  additional instructions            Durable Medical Equipment  (From admission, onward)         Start     Ordered   07/29/19 1210  Heart failure home health orders  (Heart failure home health orders / Face to face)  Once    Comments: Heart Failure Follow-up Care:  Verify follow-up appointments per Patient Discharge Instructions. Confirm transportation arranged. Reconcile home medications with discharge medication list. Remove discontinued medications from use. Assist patient/caregiver to manage medications using pill box. Reinforce  low sodium food selection Assessments: Vital signs and oxygen saturation at each visit. Assess home environment for safety concerns, caregiver support and availability of low-sodium foods. Consult Education officer, museum, PT/OT, Dietitian, and CNA based on assessments. Perform comprehensive cardiopulmonary assessment. Notify MD for any change in condition or weight gain of 3 pounds in one day or 5 pounds in one week with symptoms. Daily Weights and Symptom Monitoring: Ensure patient has access to scales. Teach patient/caregiver to weigh daily before breakfast and after voiding using same scale and record.    Teach patient/caregiver to track weight and symptoms and when to notify Provider. Activity: Develop individualized activity plan with patient/caregiver.  Needs HHOT/HHPT> S/P L BKA  Question Answer Comment  Heart Failure Follow-up Care [facility] ([facility] at [phone]   Lab frequency Other see comments   Fax lab results to AHF Clinic at [phone]   Diet Low Sodium Heart Healthy   Fluid restrictions: 2000 mL Fluid      07/29/19 1210          Disposition   The patient will be discharged in stable condition to home. Discharge Instructions    Diet - low sodium heart healthy   Complete by: As directed    INR  Goal: 2 - 2.5   Complete by: As directed    Goal: 2 - 2.5   Increase activity slowly   Complete by: As directed    Speed Settings:   Complete by: As directed    Fixed 5500  RPM Low 5200  RPM     Follow-up Information    Anderson, Chelsey L, DO Follow up on 08/09/2019.   Specialty: Family Medicine Why: 2:45 Contact information: [address] [place] [address] [place] Follow up.   Why: [phone] , therapies will start on Friday or Saturday  HHPT, HHOT            Duration of Discharge Encounter: Greater than 35 minutes   Signed, Ivorie Uplinger NP-C  07/30/2019, 11:19 AM

## 2019-07-29 NOTE — Progress Notes (Signed)
   Left above-knee amputation site healing well.  Okay for discharge from vascular standpoint and will have follow-up in our office in 2 to 3 weeks for staple removal.  [name] Pardue C. Donzetta Matters, MD [facility] Office: [phone] Pager: [phone]

## 2019-07-29 NOTE — Plan of Care (Signed)
  Problem: Education: Goal: Patient will be able to verbalize current INR target range and antiplatelet therapy for discharge home Outcome: Progressing   Problem: Education: Goal: Knowledge of the prescribed therapeutic regimen will improve Outcome: Progressing Goal: Ability to verbalize activity precautions or restrictions will improve Outcome: Progressing Goal: Understanding of discharge needs will improve Outcome: Progressing   Problem: Activity: Goal: Ability to perform//tolerate increased activity and mobilize with assistive devices will improve Outcome: Progressing   Problem: Clinical Measurements: Goal: Postoperative complications will be avoided or minimized Outcome: Progressing   Problem: Self-Care: Goal: Ability to meet self-care needs will improve Outcome: Progressing   Problem: Self-Concept: Goal: Ability to maintain and perform role responsibilities to the fullest extent possible will improve Outcome: Progressing   Problem: Pain Management: Goal: Pain level will decrease with appropriate interventions Outcome: Progressing   Problem: Skin Integrity: Goal: Demonstration of wound healing without infection will improve Outcome: Progressing   Problem: Education: Goal: Knowledge of General Education information will improve Description: Including pain rating scale, medication(s)/side effects and non-pharmacologic comfort measures Outcome: Progressing   Problem: Health Behavior/Discharge Planning: Goal: Ability to manage health-related needs will improve Outcome: Progressing   Problem: Clinical Measurements: Goal: Ability to maintain clinical measurements within normal limits will improve Outcome: Progressing Goal: Will remain free from infection Outcome: Progressing Goal: Diagnostic test results will improve Outcome: Progressing Goal: Respiratory complications will improve Outcome: Progressing Goal: Cardiovascular complication will be avoided Outcome:  Progressing   Problem: Activity: Goal: Risk for activity intolerance will decrease Outcome: Progressing   Problem: Nutrition: Goal: Adequate nutrition will be maintained Outcome: Progressing   Problem: Coping: Goal: Level of anxiety will decrease Outcome: Progressing   Problem: Elimination: Goal: Will not experience complications related to bowel motility Outcome: Progressing Goal: Will not experience complications related to urinary retention Outcome: Progressing   Problem: Pain Managment: Goal: General experience of comfort will improve Outcome: Progressing   Problem: Safety: Goal: Ability to remain free from injury will improve Outcome: Progressing   Problem: Skin Integrity: Goal: Risk for impaired skin integrity will decrease Outcome: Progressing

## 2019-07-29 NOTE — Progress Notes (Addendum)
Patient ID: [name], male   DOB: 26-Jul-1966, 53 y.o.   MRN: [id number]     Advanced Heart Failure Rounding Note  PCP-Cardiologist: Dr. Aundra Dubin   Subjective:    S/p left AKA. 07/23/19   Hgb  Stable 8.5   INR 2.0 on heparin + coumadin.     Denies SOB. Denies N/V . Asking lots of questions about his home narcotic regimen.   LVAD interrogation HM3:  Flow 3.9 L/min speed 5500 rpm PI 3.1  power 4.5.   Objective:   Weight Range: 81.5 kg Body mass index is 27.32 kg/m.   Vital Signs:   Temp:  [97.9 F (36.6 C)-99.6 F (37.6 C)] 98.2 F (36.8 C) (11/23 0351) Pulse Rate:  [62-95] 80 (11/23 0351) Resp:  [9-20] 19 (11/23 0351) BP: (92-108)/(45-84) 98/83 (11/23 0351) SpO2:  [96 %-100 %] 100 % (11/23 0351) Weight:  [81.5 kg] 81.5 kg (11/23 0351) Last BM Date: 07/28/19  Weight change: Filed Weights   07/28/19 0429 07/28/19 0654 07/29/19 0351  Weight: 83.3 kg 82.5 kg 81.5 kg    Intake/Output:   Intake/Output Summary (Last 24 hours) at 07/29/2019 0739 Last data filed at 07/29/2019 0700 Gross per 24 hour  Intake 986.14 ml  Output 2500 ml  Net -1513.86 ml      Physical Exam  Maps 90s  Physical Exam: GENERAL: No acute distress. HEENT: normal  NECK: Supple, JVP 5-6 .  2+ bilaterally, no bruits.  No lymphadenopathy or thyromegaly appreciated.  RIJ LIJ  CARDIAC:  Mechanical heart sounds with LVAD hum present.  LUNGS:  Clear to auscultation bilaterally.  ABDOMEN:  Soft, round, nontender, positive bowel sounds x4.     LVAD exit site: Dressing dry and intact.  No erythema or drainage.  Stabilization device present and accurately applied.  Driveline dressing is being changed daily per sterile technique. EXTREMITIES:  Warm and dry, no cyanosis, clubbing, rash or edema . R and L AKA. L AKA staples approximated.  NEUROLOGIC:  Alert and oriented x 4.  Gait steady.  No aphasia.  No dysarthria.  Affect pleasant.       Telemetry   SR 80s personally reviewed.   EKG     No new EKG to review today   Labs    CBC Recent Labs    07/28/19 0724 07/29/19 0637  WBC 14.6* 14.1*  HGB 7.6* 8.5*  HCT 26.9* 29.9*  MCV 90.0 89.5  PLT 299 626   Basic Metabolic Panel Recent Labs    07/27/19 0520 07/28/19 0723  NA 138 138  K 3.5 3.8  CL 103 100  CO2 26 25  GLUCOSE 292* 161*  BUN 22* 37*  CREATININE 5.99* 8.06*  CALCIUM 8.5* 8.8*  PHOS  --  4.9*   Liver Function Tests Recent Labs    07/28/19 0723  ALBUMIN 2.3*   No results for input(s): LIPASE, AMYLASE in the last 72 hours. Cardiac Enzymes No results for input(s): CKTOTAL, CKMB, CKMBINDEX, TROPONINI in the last 72 hours.  BNP: BNP (last 3 results) No results for input(s): BNP in the last 8760 hours.  ProBNP (last 3 results) No results for input(s): PROBNP in the last 8760 hours.   D-Dimer No results for input(s): DDIMER in the last 72 hours. Hemoglobin A1C No results for input(s): HGBA1C in the last 72 hours. Fasting Lipid Panel No results for input(s): CHOL, HDL, LDLCALC, TRIG, CHOLHDL, LDLDIRECT in the last 72 hours. Thyroid Function Tests No results for input(s): TSH, T4TOTAL, T3FREE,  THYROIDAB in the last 72 hours.  Invalid input(s): FREET3  Other results:   Imaging    No results found.   Medications:     Scheduled Medications: . aspirin EC  81 mg Oral Daily  . atorvastatin  40 mg Oral q1800  . calcitRIOL  0.5 mcg Oral Q M,W,F-HD  . Chlorhexidine Gluconate Cloth  6 each Topical Daily  . Chlorhexidine Gluconate Cloth  6 each Topical Q0600  . citalopram  20 mg Oral Daily  . darbepoetin (ARANESP) injection - DIALYSIS  200 mcg Intravenous Q Fri-HD  . dronabinol  2.5 mg Oral BID AC  . erythromycin  250 mg Oral TID  . gabapentin  300 mg Oral BID  . heparin  1,000 Units Intravenous Q M,W,F-HD  . influenza vac split quadrivalent PF  0.5 mL Intramuscular Tomorrow-1000  . insulin aspart  0-6 Units Subcutaneous TID WC  . insulin glargine  8 Units Subcutaneous QHS  .  metoCLOPramide  10 mg Oral TID WC  . midodrine  5 mg Oral Q M,W,F  . multivitamin  1 tablet Oral Daily  . pantoprazole  40 mg Oral BID AC  . pneumococcal 23 valent vaccine  0.5 mL Intramuscular Tomorrow-1000  . sevelamer carbonate  0.8 g Oral TID WC  . sodium chloride flush  10-40 mL Intracatheter Q12H  . sodium chloride flush  3 mL Intravenous Q12H  . Warfarin - Pharmacist Dosing Inpatient   Does not apply q1800    Infusions: . sodium chloride    . ferric gluconate (FERRLECIT/NULECIT) IV 62.5 mg (07/24/19 1458)  . heparin 1,350 Units/hr (07/28/19 1300)  . magnesium sulfate bolus IVPB      PRN Medications: sodium chloride, acetaminophen **OR** acetaminophen, docusate sodium, fentaNYL (SUBLIMAZE) injection, guaiFENesin-dextromethorphan, hydrALAZINE, labetalol, lidocaine, LORazepam, LORazepam, magnesium sulfate bolus IVPB, metoprolol tartrate, morphine injection, oxyCODONE-acetaminophen, phenol, promethazine, promethazine, sodium chloride flush, sodium chloride flush    Patient Profile   [name] is a 53 year old withCAD s/p anterior MI in 2010 and NSTEMI in 3/18 with DES to pRCA and mLAD, gastroparesis,ischemic cardiomyopathy now s/p Heartmate 3 LVAD and ESRD on HD, factor V leiden, PAF, and severe bilateral PAD s/p amputation of the 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 Now being readmitted for planned left AKA.  Assessment/Plan   1. Chronic Systolic CHF s/p HM3 LVAD: Ischemic cardiomyopathy. St Jude ICD. NSTEMI in March 2018 with DES to LAD and RCA, complicated by cardiogenic shock, low output requiring milrinone. Echo 8/18 with EF 20-25%, moderate [name]. CPX (8/18) with severe functional impairment due to HF. He is s/p HM3 LVAD placement. He had pump thrombosis 6/19 due to [facility] outflow graft kinking. He had pump exchange for another HM3.Not a candidate for transplant with PAD.Volume status managed at HD. INR 2.0 LDH pending.  - Continue coumadin and ASA 81.  - INR 2.0.   - Stop heparin drip. 2. 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 Left foot failed to improve.  s/p left AKA this admit, POD #3. Wound site stable. Still with left stump pain but better. Oversedated with Fentanyl PCA.  PCA stopped. Stop IV fentanyl.  - Continue Percocet.   3. ESRD: Will need HD Sunday and Tuesday because of holiday this week.  4.CAD: NSTEMI in 3/18. LHC with 99% ulcerated lesion proximal RCA with left to right collaterals, 95% mid LAD stenosis after mid LAD stent. s/p PCI to RCA  and LAD on 11/25/16.Denies chest pain.  - Continue statin 5. H/o DVTs: Factor V Leiden heterozygote. - Continue coumadin . Stop heparin drip.   6. Diabetic gastroparesis: Long history of this. - on reglan 10 mg tid/ac and Marinol as outpatient  - Continue Protonix bid.  - He will continue erythromycinindefinitely.  - Limit narcotic use as much as possible. 7. HTN: Maps 90s..Currently off hydralazine.  - Requires midodrine w/ HD.  8. T2DM: continue home insulin regimen  9. Anemia: Post-op blood loss, hgb up to 8.5.  Got 1 unit PRBCs on 11/20.   INR 2. Stop Heparin drip.   Goals of care meeting today at 1600.  . Consult CM for motorized wheel chair?   Length of Stay: 7  Amy Clegg, NP  07/29/2019, 7:39 AM  Advanced Heart Failure Team Pager [phone] (M-F; 7a - 4p)  Please contact [facility] for night-coverage after hours (4p -7a ) and weekends on [email or web address]  Patient seen with NP, agree with the above note.   INR up to 2 today.  We will stop heparin gtt.    Stable post-op, no operative site bleeding.    LVAD parameters stable.   Goals of care meeting at 4 pm today.  Plan for home with home health services tomorrow.   [name] 07/29/2019

## 2019-07-29 NOTE — Progress Notes (Signed)
Occupational Therapy Treatment Patient Details Name: [name] MRN: [id number] DOB: 1966/07/20 Today's Date: 07/29/2019    History of present illness 53 y.o. 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 Pt underwent L AKA on 07/23/2019.   OT comments  Pt seen in piggy back with PT.  Pt requires min A for for placement of sliding board and required min guard assist for transfer.  He demonstrates bil. UE and hand weakness that has been long standing. He reports he was previously going to OP for OT prior to the pandemic shut down in the spring, and did not return due to [name].  He fatigues with activity.  He was very animated and jovial today.  Feel he would benefit from follow up OT to maximize independence with ADLs. He wishes to defer HEP to [facility] as he feels he might discharge today or tomorrow.   Follow Up Recommendations  Home health OT;Supervision/Assistance - 24 hour    Equipment Recommendations  Other (comment)(Drop arm commode )    Recommendations for Other Services      Precautions / Restrictions Precautions Precautions: Fall;Other (comment) Precaution Comments: LVAD, B AKA Restrictions Weight Bearing Restrictions: Yes RLE Weight Bearing: Non weight bearing LLE Weight Bearing: Non weight bearing       Mobility Bed Mobility               General bed mobility comments: Pt sitting EOB with PT   Transfers Overall transfer level: Needs assistance Equipment used: Sliding board Transfers: Lateral/Scoot Transfers          Lateral/Scoot Transfers: Min assist;With slide board General transfer comment: assistance to place board and to remove board     Balance Overall balance assessment: Needs assistance Sitting-balance support: Bilateral upper extremity supported Sitting balance-[name]: Fair Sitting balance - Comments: Pt able to  maintain static sitting with supervision.  He lost balance posteriorly with dynamic tasks                                    ADL either performed or assessed with clinical judgement   ADL   Eating/Feeding: Set up;Sitting Eating/Feeding Details (indicate cue type and reason): Pt requires assist with opening containers and packets due to hand weakness that has been long standing                      Toilet Transfer: Minimal assistance;+2 for safety/equipment;Transfer board;Requires drop arm           Functional mobility during ADLs: Minimal assistance;+2 for safety/equipment       Vision       Perception     Praxis      Cognition Arousal/Alertness: Awake/alert Behavior During Therapy: WFL for tasks assessed/performed;Flat affect Overall Cognitive Status: Within Functional Limits for tasks assessed                                 General Comments: Pt very animated this date and jovial.  He demonstrates good problem solving skills and good processing speed         Exercises Exercises: Other exercises Other Exercises Other Exercises: Pt performed 6 reps chair pushups and fatigued    Shoulder Instructions  General Comments VSS.  Pt inquiring about a power w/c - explained process to pt. Pt reports he was going to [place] before the center closed due to [place] pandemic in the spring, and has not had any therapy since that time.   He fatigues with activity     Pertinent Vitals/ Pain       Pain Assessment: Faces Faces Pain Scale: Hurts a little bit Pain Location: L AKA Pain Descriptors / Indicators: Sore;Discomfort Pain Intervention(s): Monitored during session  Home Living                                          Prior Functioning/Environment              Frequency  Min 2X/week        Progress Toward Goals  OT Goals(current goals can now be found in the care plan section)  Progress towards OT  goals: Progressing toward goals     Plan Discharge plan needs to be updated    Co-evaluation    PT/OT/SLP Co-Evaluation/Treatment: Yes Reason for Co-Treatment: For patient/therapist safety;To address functional/ADL transfers   OT goals addressed during session: ADL's and self-care;Strengthening/ROM      AM-PAC OT "6 Clicks" Daily Activity     Outcome Measure   Help from another person eating meals?: A Little Help from another person taking care of personal grooming?: A Little Help from another person toileting, which includes using toliet, bedpan, or urinal?: A Lot Help from another person bathing (including washing, rinsing, drying)?: A Lot Help from another person to put on and taking off regular upper body clothing?: A Little Help from another person to put on and taking off regular lower body clothing?: A Lot 6 Click Score: 15    End of Session    OT Visit Diagnosis: Muscle weakness (generalized) (M62.81) Pain - Right/Left: Left Pain - part of body: Leg   Activity Tolerance Patient limited by fatigue   Patient Left in chair;with call bell/phone within reach   Nurse Communication Mobility status        Time: 1117-1140 OT Time Calculation (min): 23 min  Charges: OT General Charges $OT Visit: 1 Visit OT Treatments $Therapeutic Activity: 8-22 mins  Lucille Passy, OTR/L [place] Pager [phone] Office [phone]    Lucille Passy M 07/29/2019, 11:54 AM

## 2019-07-29 NOTE — Progress Notes (Signed)
CSW met at bedside with patient for planned family meeting. Unfortunately, patient's son and daughter had a mix up and did not make the meeting and requested reschedule for next week. Patient shared his goal for the meeting to discuss with the children their understanding of his physical decline. CSW will reach out to daughter tomorrow to discuss date and time for rescheduling family meeting. Raquel Sarna, [place], [place]

## 2019-07-29 NOTE — Progress Notes (Signed)
[place] for Heparin  Indication: LVAD  Allergies  Allergen Reactions  . Metformin And Related Diarrhea    Patient Measurements: Height: 5' 8"  (172.7 cm) Weight: 179 lb 10.8 oz (81.5 kg) IBW/kg (Calculated) : 68.4 Heparin Dosing Weight: 82 kg  Vital Signs: Temp: 98.2 F (36.8 C) (11/23 0351) Temp Source: Oral (11/23 0351) BP: 98/83 (11/23 0351) Pulse Rate: 80 (11/23 0351)  Labs: Recent Labs    07/27/19 0520 07/27/19 1709 07/28/19 0234 07/28/19 0723 07/28/19 0724  HGB 7.8*  --   --   --  7.6*  HCT 27.1*  --   --   --  26.9*  PLT 264  --   --   --  299  LABPROT 15.5*  --  18.1*  --   --   INR 1.2  --  1.5*  --   --   HEPARINUNFRC 0.17* 0.41 0.35  --   --   CREATININE 5.99*  --   --  8.06*  --     Estimated Creatinine Clearance: 10.1 mL/min (A) (by C-G formula based on SCr of 8.06 mg/dL (H)).   Assessment: 70 YOM with LVAD, on chronic Coumadin admitted for L AKA. Pharmacy asked to dose Coumadin and start heparin bridge until INR 1.8.  INR therapeutic today at 2.0, therefore will stop heparin. Hgb low but improved at 8.5 today. Platelets wnl at 311. No overt bleeding or infusion issues noted.  Goal of Therapy:  INR 2-3 Monitor platelets by anticoagulation protocol: Yes   Plan:  Stop Heparin Warfarin 5 mg PO x1 tonight Daily INR, CBC Monitor for signs of bleeding  Richardine Service, PharmD PGY1 Pharmacy Resident Phone: [phone] 07/29/2019  3:42 PM  Please check [email or web address] for unit-specific pharmacy phone numbers.

## 2019-07-29 NOTE — Progress Notes (Signed)
Subjective:   No c/o, seen in room, no SOB or cough  Objective Vital signs in last 24 hours: Vitals:   07/28/19 2329 07/29/19 0351 07/29/19 0800 07/29/19 1103  BP: 103/78 98/83 (!) 82/65   Pulse: 80 80  75  Resp: 10 19  13   Temp: 98.3 F (36.8 C) 98.2 F (36.8 C)  98.6 F (37 C)  TempSrc: Oral Oral  Oral  SpO2: 96% 100%  99%  Weight:  81.5 kg    Height:       Weight change: -1.8 kg  Intake/Output Summary (Last 24 hours) at 07/29/2019 1222 Last data filed at 07/29/2019 0800 Gross per 24 hour  Intake 930.2 ml  Output -  Net 930.2 ml   Dialysis: [facility] MWF  4h  2/2 bath  79kg  Hep 7000   [facility] TDC Calcitriol 0.5 mcg po/HD  Mircera 225 mcg q 2wks (last on 07/17/19)    Venofer  72m  qwkly     Summary: Pt is a 53y.o. yo male with ESRD and LVAD who was admitted on 07/22/2019 with gangrene of left leg   Assessment/Plan: 1. PAD-- s/p left AKA 11/17 per VVS, now bilat AKA-  no abx. Recovering,  pain control has been issue. 2. ESRD - normally MWF. HD yest per holiday sched, and tomorrow if still here. 3. Anemia ckd+ abl-  Hb up 8.5 this am. Max ESA given 11/11 as OP- gave here as well - getting iron 4. Secondary hyperparathyroidism- on rocaltrol and renvela 5. HTN/volume- goal of 2 liters Fri- going for 2.5 today -  Sodium low which argues for some volume overload- also face is puffy and is over EDW.  Will need lower EDW at discharge.  Continue midodrine and challenge as able   RSol Blazing   Labs: Basic Metabolic Panel: Recent Labs  Lab 07/26/19 0300 07/27/19 0520 07/28/19 0723  NA 130* 138 138  K 4.2 3.5 3.8  CL 93* 103 100  CO2 24 26 25   GLUCOSE 149* 292* 161*  BUN 22* 22* 37*  CREATININE 7.08* 5.99* 8.06*  CALCIUM 8.4* 8.5* 8.8*  PHOS  --   --  4.9*   Liver Function Tests: Recent Labs  Lab 07/22/19 1425 07/28/19 0723  AST 18  --   ALT 14  --   ALKPHOS 112  --   BILITOT 0.6  --   PROT 7.8  --   ALBUMIN 2.7* 2.3*   No results for input(s): LIPASE,  AMYLASE in the last 168 hours. No results for input(s): AMMONIA in the last 168 hours. CBC: Recent Labs  Lab 07/25/19 0500 07/26/19 0300 07/27/19 0520 07/28/19 0724 07/29/19 0637  WBC 12.1* 11.8* 10.9* 14.6* 14.1*  HGB 7.9* 7.3* 7.8* 7.6* 8.5*  HCT 26.9* 24.6* 27.1* 26.9* 29.9*  MCV 88.5 86.3 88.3 90.0 89.5  PLT 193 213 264 299 311   Cardiac Enzymes: No results for input(s): CKTOTAL, CKMB, CKMBINDEX, TROPONINI in the last 168 hours. CBG: Recent Labs  Lab 07/28/19 0622 07/28/19 1541 07/28/19 2107 07/29/19 0619 07/29/19 1107  GLUCAP 184* 155* 183* 93 130*    Iron Studies: No results for input(s): IRON, TIBC, TRANSFERRIN, FERRITIN in the last 72 hours. Studies/Results: No results found. Medications: Infusions: . sodium chloride    . ferric gluconate (FERRLECIT/NULECIT) IV 62.5 mg (07/24/19 1458)  . magnesium sulfate bolus IVPB      Scheduled Medications: . aspirin EC  81 mg Oral Daily  . atorvastatin  40  mg Oral q1800  . calcitRIOL  0.5 mcg Oral Q M,W,F-HD  . Chlorhexidine Gluconate Cloth  6 each Topical Daily  . Chlorhexidine Gluconate Cloth  6 each Topical Q0600  . citalopram  20 mg Oral Daily  . darbepoetin (ARANESP) injection - DIALYSIS  200 mcg Intravenous Q Fri-HD  . dronabinol  2.5 mg Oral BID AC  . erythromycin  250 mg Oral TID  . gabapentin  300 mg Oral BID  . heparin  1,000 Units Intravenous Q M,W,F-HD  . influenza vac split quadrivalent PF  0.5 mL Intramuscular Tomorrow-1000  . insulin aspart  0-6 Units Subcutaneous TID WC  . insulin glargine  8 Units Subcutaneous QHS  . metoCLOPramide  10 mg Oral TID WC  . midodrine  5 mg Oral Q M,W,F  . multivitamin  1 tablet Oral Daily  . pantoprazole  40 mg Oral BID AC  . pneumococcal 23 valent vaccine  0.5 mL Intramuscular Tomorrow-1000  . sevelamer carbonate  0.8 g Oral TID WC  . sodium chloride flush  10-40 mL Intracatheter Q12H  . sodium chloride flush  3 mL Intravenous Q12H  . Warfarin - Pharmacist Dosing  Inpatient   Does not apply q1800    have reviewed scheduled and prn medications.  Physical Exam: General:  NAD Heart: LVAD Lungs: mostly clear- is on [place] Abdomen: soft, non tender  Extremities: post op Dialysis Access: left TDC     07/29/2019,12:22 PM  LOS: 7 days

## 2019-07-29 NOTE — TOC Progression Note (Addendum)
Transition of Care [facility]) - Progression Note    Patient Details  Name: [name] MRN: [id number] Date of Birth: 10-07-1965  Transition of Care [facility]) CM/SW Contact  Zenon Mayo, RN Phone Number: 07/29/2019, 2:53 PM  Clinical Narrative:    NCM offered choice to patient for HHPT/HHOT, he chose [facility], referral made to [facility] with [facility], she states she will let NCM know if she can take referral.  Awaiting call back.  Patient will have to go thru his insurance to get a power w/chair. Per Mateo Flow with [facility] states they can take with a soc date of Friday or Saturday.     Expected Discharge Plan: [place] Barriers to Discharge: No Barriers Identified  Expected Discharge Plan and Services Expected Discharge Plan: Grandfather In-house Referral: NA Discharge Planning Services: CM Consult Post Acute Care Choice: [place] arrangements for the past 2 months: Single Family Home                 DME Arranged: (NA)         HH Arranged: PT, OT HH Agency: [place] ([facility]) Date HH Agency Contacted: 07/29/19 Time [place]: [place] Representative spoke with at [place]: [place] ([place]) Interventions    Readmission Risk Interventions Readmission Risk Prevention Plan 07/29/2019 07/22/2019 03/20/2019  Transportation Screening Complete Complete Complete  Medication Review Press photographer) Complete Complete Complete  PCP or Specialist appointment within 3-5 days of discharge Complete - -  HRI or [place] Complete Complete Complete  SW Recovery Care/Counseling Consult Complete Complete Complete  Palliative Care Screening Not Applicable Not Applicable Not [place] Not Applicable Not Applicable Not Applicable  Some recent data might be hidden

## 2019-07-29 NOTE — Progress Notes (Signed)
LVAD Coordinator Rounding Note:  Admitted 07/22/19 per Dr. Aundra Dubin for heparin bridge for left AKA.    HM III LVAD implanted on 06/12/17 by Dr. Darcey Nora under Destination Therapy criteria. Pump exchange performed on 03/01/18 per Dr. Darcey Nora for pump thrombus.   Pt awake, lying in bed. He just spoke with Darrick Grinder, NP and says he may get discharged home today. Pt's daughter called to confirm meeting time and place with Raquel Sarna, LCSW today. Contacted Kennyth Lose and confirmed it is scheduled at 4pm today. Asked pt if he would like to re-schedule due to possible discharge, he says "no, I want to have this meeting today". Pt has concerns about dose and amount of pain medication he will be discharged home on. He has addressed this with our provider, Amy.  He is awake, alert, with no complaints of pain at this time.   Vital signs: Temp: 99.6 HR: 71 Doppler Pressure:  80 Automatic BP:  100/90 (94) O2 Sat: 99 % RA  Wt: 188.7>177>174.6>180.5>177>183>179.7 lbs   LVAD interrogation reveals:  Speed: 5500 Flow: 4.2 Power: 4.4w PI: 3.9 Alarms: none Events: 3 PI events on 07/28/19 Hematocrit: 25  Fixed speed: 5500 Low speed limit: 5200   Drive Line: Dressing C/D/I; anchor intact and accurately applied. Weekly dressing changes per bedside nurse; next dressing change due 07/2919.  Labs: LDH trend: 183>160>166>164>215>204  INR trend: 1.2>1.3>1.2>1.5>2.0   Anticoagulation Plan: -INR Goal: 2.0 - 3.0 -ASA Dose: 81 mg daily - Heparin >> stopped 07/29/19 - Coumadin started last night 07/24/19  Device: [employer] dual ICD - Therapies: on 200 bpm - Last check: 01/01/18 - 2 hrs Afl/Afib; EMI 04/02/18   Plan/Recommendations:  1. Call VAD pager if any questions about VAD equipment or drive line. 2.Weekly dressing changes per [facility] nurse; next dressing change due 08/04/19.  Zada Girt RN [place] Coordinator  Office: [phone]  24/7 Pager: [phone]

## 2019-07-29 NOTE — Progress Notes (Signed)
Patient ID: [name], male   DOB: [birth year], 53 y.o.   MRN: [id number]  This NP visited patient at the bedside as a follow up for palliative medicine needs and emotional support and to meet with patient and his family as planned for continued goals of care conversation.  Unfortunately patient's daughter and son were unable to be at the hospital as planned.  Patient verbalized the importance of this meeting.  I did have further discussion with the patient regarding his expectations for meeting with his family. Patient verbalizes his understanding of the seriousness of his current medical situation, currently he is supported by an LVAD and has end-stage renal disease dependent on dialysis.  His hope is to meet again with the palliative medicine team for continued conversation with his family regarding his current medical situation, goals of care and end-of-life wishes.  I assured the patient that we would figure out a way to have family meeting.  Questions and concerns addressed   Discussed with Raquel Sarna LCSW, she has coordinated a meeting for when the patient returns to clinic  This nurse practitioner plans  to meet with [name] and his family when he follows up in the VAD clinic on December 8 at 10 AM  Total time spent on the unit was 20 minutes    Patient is encouraged to call me with questions or concerns, I left contact information.  Greater than 50% of the time was spent in counseling and coordination of care  Wadie Lessen NP  Palliative Medicine Team Team Phone # [phone] Pager [phone]

## 2019-07-30 ENCOUNTER — Telehealth (HOSPITAL_COMMUNITY): Payer: Self-pay | Admitting: Licensed Clinical Social Worker

## 2019-07-30 LAB — CBC
HCT: 28.3 % — ABNORMAL LOW (ref 39.0–52.0)
Hemoglobin: 8.1 g/dL — ABNORMAL LOW (ref 13.0–17.0)
MCH: 26.1 pg (ref 26.0–34.0)
MCHC: 28.6 g/dL — ABNORMAL LOW (ref 30.0–36.0)
MCV: 91.3 fL (ref 80.0–100.0)
Platelets: 313 10*3/uL (ref 150–400)
RBC: 3.1 MIL/uL — ABNORMAL LOW (ref 4.22–5.81)
RDW: 18.7 % — ABNORMAL HIGH (ref 11.5–15.5)
WBC: 10.9 10*3/uL — ABNORMAL HIGH (ref 4.0–10.5)
nRBC: 1.3 % — ABNORMAL HIGH (ref 0.0–0.2)

## 2019-07-30 LAB — BASIC METABOLIC PANEL
Anion gap: 12 (ref 5–15)
BUN: 37 mg/dL — ABNORMAL HIGH (ref 6–20)
CO2: 26 mmol/L (ref 22–32)
Calcium: 9.1 mg/dL (ref 8.9–10.3)
Chloride: 96 mmol/L — ABNORMAL LOW (ref 98–111)
Creatinine, Ser: 7.98 mg/dL — ABNORMAL HIGH (ref 0.61–1.24)
GFR calc Af Amer: 8 mL/min — ABNORMAL LOW (ref >60)
GFR calc non Af Amer: 7 mL/min — ABNORMAL LOW (ref >60)
Glucose, Bld: 147 mg/dL — ABNORMAL HIGH (ref 70–99)
Potassium: 4.6 mmol/L (ref 3.5–5.1)
Sodium: 134 mmol/L — ABNORMAL LOW (ref 135–145)

## 2019-07-30 LAB — GLUCOSE, CAPILLARY
Glucose-Capillary: 173 mg/dL — ABNORMAL HIGH (ref 70–99)
Glucose-Capillary: 180 mg/dL — ABNORMAL HIGH (ref 70–99)
Glucose-Capillary: 131 mg/dL — ABNORMAL HIGH (ref 70–99)
Glucose-Capillary: 102 mg/dL — ABNORMAL HIGH (ref 70–99)

## 2019-07-30 LAB — PROTIME-INR
INR: 2.4 — ABNORMAL HIGH (ref 0.8–1.2)
Prothrombin Time: 25.8 seconds — ABNORMAL HIGH (ref 11.4–15.2)

## 2019-07-30 LAB — LACTATE DEHYDROGENASE: LDH: 174 U/L (ref 98–192)

## 2019-07-30 MED ORDER — PNEUMOCOCCAL VAC POLYVALENT 25 MCG/0.5ML IJ INJ
0.5000 mL | INJECTION | Freq: Once | INTRAMUSCULAR | Status: AC
  Administered 2019-07-30: 0.5 mL via INTRAMUSCULAR
  Filled 2019-07-30: qty 0.5

## 2019-07-30 MED ORDER — MIDODRINE HCL 5 MG PO TABS
ORAL_TABLET | ORAL | Status: AC
  Filled 2019-07-30: qty 1

## 2019-07-30 MED ORDER — TRAMADOL HCL 50 MG PO TABS: 50.0000 mg | ORAL_TABLET | Freq: Four times a day (QID) | ORAL | 0 refills | Status: DC | PRN

## 2019-07-30 MED ORDER — CALCITRIOL 0.25 MCG PO CAPS
ORAL_CAPSULE | ORAL | Status: AC
  Filled 2019-07-30: qty 1

## 2019-07-30 MED ORDER — HEPARIN SODIUM (PORCINE) 1000 UNIT/ML IJ SOLN
INTRAMUSCULAR | Status: AC
  Administered 2019-07-30: 3400 [IU] via INTRAVENOUS
  Filled 2019-07-30: qty 4

## 2019-07-30 MED ORDER — WARFARIN SODIUM 5 MG PO TABS: 5.0000 mg | ORAL_TABLET | Freq: Every day | ORAL | 11 refills | Status: DC

## 2019-07-30 MED ORDER — HEPARIN SODIUM (PORCINE) 1000 UNIT/ML IJ SOLN
INTRAMUSCULAR | Status: AC
  Administered 2019-07-30: 3500 [IU] via INTRAVENOUS
  Filled 2019-07-30: qty 4

## 2019-07-30 MED ORDER — INFLUENZA VAC SPLIT QUAD 0.5 ML IM SUSY
0.5000 mL | PREFILLED_SYRINGE | Freq: Once | INTRAMUSCULAR | Status: AC
  Administered 2019-07-30: 0.5 mL via INTRAMUSCULAR
  Filled 2019-07-30: qty 0.5

## 2019-07-30 NOTE — Progress Notes (Signed)
Subjective:   No c/o, seen on HD  Objective Vital signs in last 24 hours: Vitals:   07/30/19 1000 07/30/19 1030 07/30/19 1036 07/30/19 1109  BP: 94/66 104/83 (!) 99/52 100/63  Pulse: 75 80 76 80  Resp: 11 11 13 13   Temp:   98.8 F (37.1 C) 98.7 F (37.1 C)  TempSrc:   Oral Oral  SpO2:   100% 95%  Weight:      Height:       Weight change:   Intake/Output Summary (Last 24 hours) at 07/30/2019 1154 Last data filed at 07/30/2019 1109 Gross per 24 hour  Intake 930 ml  Output 3000 ml  Net -2070 ml   Dialysis: [facility] MWF  4h  2/2 bath  79kg  Hep 7000   [facility] TDC Calcitriol 0.5 mcg po/HD  Mircera 225 mcg q 2wks (last on 07/17/19)    Venofer  88m  qwkly     Summary: Pt is a 53y.o. yo male with ESRD and LVAD who was admitted on 07/22/2019 with gangrene of left leg   Assessment/Plan: 1. PAD-- s/p left AKA 11/17 per VVS, now bilat AKA-  no abx.  2. ESRD - normally MWF. HD today on holiday schedule.  3. Anemia ckd+ abl-  Hb up 8.5 this am. Max ESA given 11/11 as OP- gave here as well - getting iron 4. Secondary hyperparathyroidism- on rocaltrol and renvela 5. HTN/volume- goal 3 liters today, try to lower edw in OP setting to make up for amputation  RSol Blazing   Labs: Basic Metabolic Panel: Recent Labs  Lab 07/27/19 0520 07/28/19 0723 07/30/19 0500  NA 138 138 134*  K 3.5 3.8 4.6  CL 103 100 96*  CO2 26 25 26   GLUCOSE 292* 161* 147*  BUN 22* 37* 37*  CREATININE 5.99* 8.06* 7.98*  CALCIUM 8.5* 8.8* 9.1  PHOS  --  4.9*  --    Liver Function Tests: Recent Labs  Lab 07/28/19 0723  ALBUMIN 2.3*   No results for input(s): LIPASE, AMYLASE in the last 168 hours. No results for input(s): AMMONIA in the last 168 hours. CBC: Recent Labs  Lab 07/26/19 0300 07/27/19 0520 07/28/19 0724 07/29/19 0637 07/30/19 0500  WBC 11.8* 10.9* 14.6* 14.1* 10.9*  HGB 7.3* 7.8* 7.6* 8.5* 8.1*  HCT 24.6* 27.1* 26.9* 29.9* 28.3*  MCV 86.3 88.3 90.0 89.5 91.3  PLT 213 264 299  311 313   Cardiac Enzymes: No results for input(s): CKTOTAL, CKMB, CKMBINDEX, TROPONINI in the last 168 hours. CBG: Recent Labs  Lab 07/29/19 1107 07/29/19 1631 07/29/19 2123 07/30/19 0632 07/30/19 1122  GLUCAP 130* 173* 180* 131* 102*    Iron Studies: No results for input(s): IRON, TIBC, TRANSFERRIN, FERRITIN in the last 72 hours. Studies/Results: No results found. Medications: Infusions: . sodium chloride    . ferric gluconate (FERRLECIT/NULECIT) IV 62.5 mg (07/30/19 0930)  . magnesium sulfate bolus IVPB      Scheduled Medications: . aspirin EC  81 mg Oral Daily  . atorvastatin  40 mg Oral q1800  . calcitRIOL      . calcitRIOL  0.5 mcg Oral Q M,W,F-HD  . Chlorhexidine Gluconate Cloth  6 each Topical Daily  . Chlorhexidine Gluconate Cloth  6 each Topical Q0600  . citalopram  20 mg Oral Daily  . darbepoetin (ARANESP) injection - DIALYSIS  200 mcg Intravenous Q Fri-HD  . dronabinol  2.5 mg Oral BID AC  . erythromycin  250 mg Oral TID  .  gabapentin  300 mg Oral BID  . heparin  1,000 Units Intravenous Q M,W,F-HD  . influenza vac split quadrivalent PF  0.5 mL Intramuscular Tomorrow-1000  . insulin aspart  0-6 Units Subcutaneous TID WC  . insulin glargine  8 Units Subcutaneous QHS  . metoCLOPramide  10 mg Oral TID WC  . midodrine      . midodrine  5 mg Oral Q M,W,F  . multivitamin  1 tablet Oral Daily  . pantoprazole  40 mg Oral BID AC  . pneumococcal 23 valent vaccine  0.5 mL Intramuscular Tomorrow-1000  . sevelamer carbonate  0.8 g Oral TID WC  . sodium chloride flush  10-40 mL Intracatheter Q12H  . sodium chloride flush  3 mL Intravenous Q12H  . Warfarin - Pharmacist Dosing Inpatient   Does not apply q1800    have reviewed scheduled and prn medications.  Physical Exam: General:  NAD Heart: LVAD Lungs: mostly clear- is on [place] Abdomen: soft, non tender  Extremities: post op Dialysis Access: left TDC     07/30/2019,11:54 AM  LOS: 8 days

## 2019-07-30 NOTE — Plan of Care (Signed)
  Problem: Education: Goal: Patient will be able to verbalize current INR target range and antiplatelet therapy for discharge home Outcome: Progressing   Problem: Education: Goal: Knowledge of the prescribed therapeutic regimen will improve Outcome: Progressing Goal: Ability to verbalize activity precautions or restrictions will improve Outcome: Progressing Goal: Understanding of discharge needs will improve Outcome: Progressing   Problem: Activity: Goal: Ability to perform//tolerate increased activity and mobilize with assistive devices will improve Outcome: Progressing   Problem: Clinical Measurements: Goal: Postoperative complications will be avoided or minimized Outcome: Progressing   Problem: Self-Care: Goal: Ability to meet self-care needs will improve Outcome: Progressing   Problem: Self-Concept: Goal: Ability to maintain and perform role responsibilities to the fullest extent possible will improve Outcome: Progressing   Problem: Pain Management: Goal: Pain level will decrease with appropriate interventions Outcome: Progressing   Problem: Skin Integrity: Goal: Demonstration of wound healing without infection will improve Outcome: Progressing   Problem: Education: Goal: Knowledge of General Education information will improve Description: Including pain rating scale, medication(s)/side effects and non-pharmacologic comfort measures Outcome: Progressing   Problem: Health Behavior/Discharge Planning: Goal: Ability to manage health-related needs will improve Outcome: Progressing   Problem: Clinical Measurements: Goal: Ability to maintain clinical measurements within normal limits will improve Outcome: Progressing Goal: Will remain free from infection Outcome: Progressing Goal: Diagnostic test results will improve Outcome: Progressing Goal: Respiratory complications will improve Outcome: Progressing Goal: Cardiovascular complication will be avoided Outcome:  Progressing   Problem: Activity: Goal: Risk for activity intolerance will decrease Outcome: Progressing   Problem: Nutrition: Goal: Adequate nutrition will be maintained Outcome: Progressing   Problem: Coping: Goal: Level of anxiety will decrease Outcome: Progressing   Problem: Elimination: Goal: Will not experience complications related to bowel motility Outcome: Progressing Goal: Will not experience complications related to urinary retention Outcome: Progressing   Problem: Pain Managment: Goal: General experience of comfort will improve Outcome: Progressing   Problem: Safety: Goal: Ability to remain free from injury will improve Outcome: Progressing   Problem: Skin Integrity: Goal: Risk for impaired skin integrity will decrease Outcome: Progressing

## 2019-07-30 NOTE — Progress Notes (Signed)
Discussed and explained discharge instructions, f/u visits to pt and son, prescription given to archie. No complaints at this time.

## 2019-07-30 NOTE — Progress Notes (Signed)
Patient transported to dialysis

## 2019-07-30 NOTE — Telephone Encounter (Signed)
CSW contacted daughter to reschedule family meeting for Tuesday December 8th. No answer and CSW unable to leave message. Patient scheduled to go home today from the hospital. Will attempt to try next week. Raquel Sarna, [place], [place]

## 2019-07-30 NOTE — Progress Notes (Signed)
[place] for Heparin  Indication: LVAD  Allergies  Allergen Reactions  . Metformin And Related Diarrhea    Patient Measurements: Height: 5' 8"  (172.7 cm) Weight: 179 lb 10.8 oz (81.5 kg) IBW/kg (Calculated) : 68.4 Heparin Dosing Weight: 82 kg  Vital Signs: Temp: 98 F (36.7 C) (11/24 0653) Temp Source: Oral (11/24 0653) BP: 134/113 (11/24 0800) Pulse Rate: 75 (11/24 0800)  Labs: Recent Labs    07/27/19 1709 07/28/19 0234 07/28/19 0723  07/28/19 0724 07/29/19 0637 07/30/19 0500  HGB  --   --   --    < > 7.6* 8.5* 8.1*  HCT  --   --   --   --  26.9* 29.9* 28.3*  PLT  --   --   --   --  299 311 313  LABPROT  --  18.1*  --   --   --  22.3* 25.8*  INR  --  1.5*  --   --   --  2.0* 2.4*  HEPARINUNFRC 0.41 0.35  --   --   --  0.32  --   CREATININE  --   --  8.06*  --   --   --  7.98*   < > = values in this interval not displayed.    Estimated Creatinine Clearance: 10.2 mL/min (A) (by C-G formula based on SCr of 7.98 mg/dL (H)).   Assessment: 9 YOM with LVAD, on chronic Coumadin admitted for L AKA. Pharmacy asked to dose Coumadin and start heparin bridge until INR 1.8.  INR therapeutic today at 2.4. Hgb low but stable at 8.1 today. Platelets wnl at 313. No overt bleeding noted.  Goal of Therapy:  INR 2-3 Monitor platelets by anticoagulation protocol: Yes   Plan:  Warfarin 5 mg PO x1 tonight Discharge home on warfarin 5 mg PO daily today  Richardine Service, PharmD PGY1 Pharmacy Resident Phone: [phone] 07/30/2019  9:58 AM  Please check [email or web address] for unit-specific pharmacy phone numbers.

## 2019-07-30 NOTE — Progress Notes (Signed)
PT Cancellation Note  Patient Details Name: [name] MRN: [id number] DOB: March 12, 1966   Cancelled Treatment:    Reason Eval/Treat Not Completed: Patient at procedure or test/unavailable (HD). Will follow-up for PT treatment as schedule permits.  Mabeline Caras, PT, DPT Acute Rehabilitation Services  Pager [phone] Office [place] 07/30/2019, 7:33 AM

## 2019-07-30 NOTE — TOC Transition Note (Signed)
Transition of Care [facility]) - CM/SW Discharge Note   Patient Details  Name: ORION MOLE MRN: [id number] Date of Birth: [birth year]  Transition of Care [facility]) CM/SW Contact:  Zenon Mayo, RN Phone Number: 07/30/2019, 12:11 PM   Clinical Narrative:    NCM offered choice to patient for HHPT/HHOT, he chose [facility], referral made to [facility] with [facility], she states she will let NCM know if she can take referral.  Awaiting call back.  Patient will have to go thru his insurance to get a power w/chair. Per Mateo Flow with [facility] states they can take with a soc date of Friday or Saturday.  Final next level of care: [place] Barriers to Discharge: No Barriers Identified   Patient Goals and CMS Choice Patient states their goals for this hospitalization and ongoing recovery are:: go home CMS Medicare.gov Compare Post Acute Care list provided to:: Patient Choice offered to / list presented to : Patient  Discharge Placement                       Discharge Plan and Services In-house Referral: NA Discharge Planning Services: CM Consult Post Acute Care Choice: Home Health          DME Arranged: (NA)         HH Arranged: PT, OT [place] Agency: [place] ([facility]) Date HH Agency Contacted: 07/29/19 Time HH Agency Contacted: 1500 Representative spoke with at [place]: [place] ([place]) Interventions     Readmission Risk Interventions Readmission Risk Prevention Plan 07/29/2019 07/22/2019 03/20/2019  Transportation Screening Complete Complete Complete  Medication Review Press photographer) Complete Complete Complete  PCP or Specialist appointment within 3-5 days of discharge Complete - -  HRI or [place] Complete Complete Complete  SW Recovery Care/Counseling Consult Complete Complete Complete  Palliative Care Screening Not Applicable Not Applicable Not [place] Not Applicable Not Applicable  Not Applicable  Some recent data might be hidden

## 2019-07-30 NOTE — Plan of Care (Signed)
  Problem: Education: Goal: Patient will be able to verbalize current INR target range and antiplatelet therapy for discharge home Outcome: Completed/Met   Problem: Education: Goal: Knowledge of the prescribed therapeutic regimen will improve Outcome: Completed/Met Goal: Ability to verbalize activity precautions or restrictions will improve Outcome: Completed/Met Goal: Understanding of discharge needs will improve Outcome: Completed/Met   Problem: Activity: Goal: Ability to perform//tolerate increased activity and mobilize with assistive devices will improve Outcome: Completed/Met   Problem: Clinical Measurements: Goal: Postoperative complications will be avoided or minimized Outcome: Completed/Met   Problem: Self-Care: Goal: Ability to meet self-care needs will improve Outcome: Completed/Met   Problem: Self-Concept: Goal: Ability to maintain and perform role responsibilities to the fullest extent possible will improve Outcome: Completed/Met   Problem: Pain Management: Goal: Pain level will decrease with appropriate interventions Outcome: Completed/Met   Problem: Skin Integrity: Goal: Demonstration of wound healing without infection will improve Outcome: Completed/Met   Problem: Education: Goal: Knowledge of General Education information will improve Description: Including pain rating scale, medication(s)/side effects and non-pharmacologic comfort measures Outcome: Completed/Met   Problem: Health Behavior/Discharge Planning: Goal: Ability to manage health-related needs will improve Outcome: Completed/Met   Problem: Clinical Measurements: Goal: Ability to maintain clinical measurements within normal limits will improve Outcome: Completed/Met Goal: Will remain free from infection Outcome: Completed/Met Goal: Diagnostic test results will improve Outcome: Completed/Met Goal: Respiratory complications will improve Outcome: Completed/Met Goal: Cardiovascular  complication will be avoided Outcome: Completed/Met   Problem: Activity: Goal: Risk for activity intolerance will decrease Outcome: Completed/Met   Problem: Nutrition: Goal: Adequate nutrition will be maintained Outcome: Completed/Met   Problem: Coping: Goal: Level of anxiety will decrease Outcome: Completed/Met   Problem: Elimination: Goal: Will not experience complications related to bowel motility Outcome: Completed/Met Goal: Will not experience complications related to urinary retention Outcome: Completed/Met   Problem: Pain Managment: Goal: General experience of comfort will improve Outcome: Completed/Met   Problem: Safety: Goal: Ability to remain free from injury will improve Outcome: Completed/Met   Problem: Skin Integrity: Goal: Risk for impaired skin integrity will decrease Outcome: Completed/Met  Pt going home

## 2019-07-30 NOTE — Progress Notes (Signed)
Patient ID: [name], male   DOB: 1966/05/31, 53 y.o.   MRN: [id number]     Advanced Heart Failure Rounding Note  PCP-Cardiologist: Dr. Aundra [name]   Subjective:    S/p left AKA. 07/23/19   Hgb 8.1, not much change.   INR 2.4, he is off heparin gtt.  No surgical site bleeding.      Denies SOB. No nausea/vomiting.  Getting HD this morning.   LVAD interrogation HM3:  Flow 3.8 L/min speed 5500 rpm PI 3.4  power 4.5.   Objective:   Weight Range: 81.5 kg Body mass index is 27.32 kg/m.   Vital Signs:   Temp:  [98 F (36.7 C)-98.6 F (37 C)] 98 F (36.7 C) (11/24 0653) Pulse Rate:  [70-84] 75 (11/24 0800) Resp:  [10-17] 10 (11/24 0800) BP: (89-134)/(77-113) 134/113 (11/24 0800) SpO2:  [55 %-100 %] 97 % (11/24 0653) Last BM Date: 07/28/19  Weight change: Filed Weights   07/28/19 0429 07/28/19 0654 07/29/19 0351  Weight: 83.3 kg 82.5 kg 81.5 kg    Intake/Output:   Intake/Output Summary (Last 24 hours) at 07/30/2019 0916 Last data filed at 07/30/2019 0318 Gross per 24 hour  Intake 480 ml  Output -  Net 480 ml      Physical Exam  Maps 90s  General: Well appearing this am. NAD.  HEENT: Normal. Neck: Supple, JVP 7-8 cm. Carotids OK.  Cardiac:  Mechanical heart sounds with LVAD hum present.  Lungs:  CTAB, normal effort.  Abdomen:  NT, ND, no HSM. No bruits or masses. +BS  LVAD exit site: Well-healed and incorporated. Dressing dry and intact. No erythema or drainage. Stabilization device present and accurately applied. Driveline dressing changed daily per sterile technique. Extremities:  S/p left AKA, surgical site benign.  Neuro:  Alert & oriented x 3. Cranial nerves grossly intact. Moves all 4 extremities w/o difficulty. Affect pleasant     Telemetry   SR 70s-80s personally reviewed.   EKG    No new EKG to review today   Labs    CBC Recent Labs    07/29/19 0637 07/30/19 0500  WBC 14.1* 10.9*  HGB 8.5* 8.1*  HCT 29.9* 28.3*  MCV 89.5 91.3   PLT 311 790   Basic Metabolic Panel Recent Labs    07/28/19 0723 07/30/19 0500  NA 138 134*  K 3.8 4.6  CL 100 96*  CO2 25 26  GLUCOSE 161* 147*  BUN 37* 37*  CREATININE 8.06* 7.98*  CALCIUM 8.8* 9.1  PHOS 4.9*  --    Liver Function Tests Recent Labs    07/28/19 0723  ALBUMIN 2.3*   No results for input(s): LIPASE, AMYLASE in the last 72 hours. Cardiac Enzymes No results for input(s): CKTOTAL, CKMB, CKMBINDEX, TROPONINI in the last 72 hours.  BNP: BNP (last 3 results) No results for input(s): BNP in the last 8760 hours.  ProBNP (last 3 results) No results for input(s): PROBNP in the last 8760 hours.   D-Dimer No results for input(s): DDIMER in the last 72 hours. Hemoglobin A1C No results for input(s): HGBA1C in the last 72 hours. Fasting Lipid Panel No results for input(s): CHOL, HDL, LDLCALC, TRIG, CHOLHDL, LDLDIRECT in the last 72 hours. Thyroid Function Tests No results for input(s): TSH, T4TOTAL, T3FREE, THYROIDAB in the last 72 hours.  Invalid input(s): FREET3  Other results:   Imaging    No results found.   Medications:     Scheduled Medications: . aspirin EC  81  mg Oral Daily  . atorvastatin  40 mg Oral q1800  . calcitRIOL      . calcitRIOL      . calcitRIOL  0.5 mcg Oral Q M,W,F-HD  . Chlorhexidine Gluconate Cloth  6 each Topical Daily  . Chlorhexidine Gluconate Cloth  6 each Topical Q0600  . citalopram  20 mg Oral Daily  . darbepoetin (ARANESP) injection - DIALYSIS  200 mcg Intravenous Q Fri-HD  . dronabinol  2.5 mg Oral BID AC  . erythromycin  250 mg Oral TID  . gabapentin  300 mg Oral BID  . heparin  1,000 Units Intravenous Q M,W,F-HD  . influenza vac split quadrivalent PF  0.5 mL Intramuscular Tomorrow-1000  . insulin aspart  0-6 Units Subcutaneous TID WC  . insulin glargine  8 Units Subcutaneous QHS  . metoCLOPramide  10 mg Oral TID WC  . midodrine      . midodrine  5 mg Oral Q M,W,F  . multivitamin  1 tablet Oral Daily  .  pantoprazole  40 mg Oral BID AC  . pneumococcal 23 valent vaccine  0.5 mL Intramuscular Tomorrow-1000  . sevelamer carbonate  0.8 g Oral TID WC  . sodium chloride flush  10-40 mL Intracatheter Q12H  . sodium chloride flush  3 mL Intravenous Q12H  . Warfarin - Pharmacist Dosing Inpatient   Does not apply q1800    Infusions: . sodium chloride    . ferric gluconate (FERRLECIT/NULECIT) IV 62.5 mg (07/24/19 1458)  . magnesium sulfate bolus IVPB      PRN Medications: sodium chloride, acetaminophen **OR** acetaminophen, docusate sodium, guaiFENesin-dextromethorphan, hydrALAZINE, labetalol, lidocaine, LORazepam, LORazepam, magnesium sulfate bolus IVPB, metoprolol tartrate, oxyCODONE-acetaminophen, phenol, promethazine, promethazine, sodium chloride flush, sodium chloride flush    Patient Profile   [name] is a 53 year old withCAD s/p anterior MI in 2010 and NSTEMI in 3/18 with DES to pRCA and mLAD, gastroparesis,ischemic cardiomyopathy now s/p Heartmate 3 LVAD and ESRD on HD, factor V leiden, PAF, and severe bilateral PAD s/p amputation of the 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 Now being readmitted for planned left AKA.  Assessment/Plan   1. Chronic Systolic CHF s/p HM3 LVAD: Ischemic cardiomyopathy. [employer] ICD. NSTEMI in March 2018 with DES to LAD and RCA, complicated by cardiogenic shock, low output requiring milrinone. Echo 8/18 with EF 20-25%, moderate MR. CPX (8/18) with severe functional impairment due to HF. He is s/p HM3 LVAD placement. He had pump thrombosis 6/19 due to [facility] outflow graft kinking. He had pump exchange for another HM3.Not a candidate for transplant with PAD.Volume status managed at HD. INR 2.4, LDH 174.  - Continue coumadin goal INR 2-2.5 and ASA 81.  2. 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 Left foot failed to improve.  s/p left AKA this admit, POD #3. Wound site stable. Still with left stump  pain but better. Oversedated with Fentanyl PCA.  PCA stopped.   - Slowly tapering down on oxycodone.  3. ESRD: HD today, then again on Friday.   4.CAD: NSTEMI in 3/18. LHC with 99% ulcerated lesion proximal RCA with left to right collaterals, 95% mid LAD stenosis after mid LAD stent. s/p PCI to RCA and LAD on 11/25/16.Denies chest pain.  - Continue statin 5. H/o DVTs: Factor V Leiden heterozygote. - Continue coumadin.  6. Diabetic gastroparesis: Long history of this. - on reglan 10 mg tid/ac and Marinol  as outpatient  - Continue Protonix bid.  - He will continue erythromycinindefinitely.  - Limit narcotic use as much as possible. 7. HTN: Maps 90s.  Currently off hydralazine.  - Requires midodrine w/ HD. - Will follow MAP as outpatient to see if he will need hydralazine.   8. T2DM: continue home insulin regimen  9. Anemia: Post-op blood loss, hgb 8.1 today.  Got 1 unit PRBCs on 11/20.   He can go home today with home health/PT.  Will have goals of care meeting at next outpatient visit (unable to coordinate with family yesterday).   Length of Stay: 8  Loralie Champagne, MD  07/30/2019, 9:16 AM  Advanced Heart Failure Team Pager [phone] (M-F; 7a - 4p)  Please contact [facility] for night-coverage after hours (4p -7a ) and weekends on [email or web address]

## 2019-07-31 ENCOUNTER — Telehealth: Payer: Self-pay | Admitting: Nephrology

## 2019-07-31 NOTE — Telephone Encounter (Signed)
Date of Discharge 07/30/2019  Date oc Contact     07/31/2019 Method of contact   Phone   Discussed with patient   Pateint contact to discuss transition of care from recent inpatient hospitalization . Patient was admitted to [facility] from 11/ 16 to 07/30/19  With discharge diagnosis  of PAD with L AKA  Medication changes = no question  Patient  will follow up with his outpatient dialysis center on Friday 08/02/19   He reports no new out patient needs known

## 2019-08-03 DIAGNOSIS — Z4781 Encounter for orthopedic aftercare following surgical amputation: Secondary | ICD-10-CM

## 2019-08-05 ENCOUNTER — Encounter (HOSPITAL_BASED_OUTPATIENT_CLINIC_OR_DEPARTMENT_OTHER): Payer: BC Managed Care – PPO | Admitting: Internal Medicine

## 2019-08-06 ENCOUNTER — Telehealth (HOSPITAL_COMMUNITY): Payer: Self-pay

## 2019-08-06 LAB — POCT INR: INR: 2.6 (ref 2.0–3.0)

## 2019-08-06 NOTE — Telephone Encounter (Signed)
Received vm from [place] requesting verbal orders for OT.  Verbal given to continue OT. They will fax paper copy to be signed.

## 2019-08-07 ENCOUNTER — Ambulatory Visit (HOSPITAL_COMMUNITY): Payer: Self-pay | Admitting: Pharmacist

## 2019-08-07 DIAGNOSIS — Z5181 Encounter for therapeutic drug level monitoring: Secondary | ICD-10-CM

## 2019-08-07 DIAGNOSIS — Z95811 Presence of heart assist device: Secondary | ICD-10-CM

## 2019-08-07 NOTE — Progress Notes (Signed)
LVAD INR 

## 2019-08-08 ENCOUNTER — Ambulatory Visit: Payer: BC Managed Care – PPO | Admitting: Occupational Therapy

## 2019-08-09 ENCOUNTER — Other Ambulatory Visit: Payer: Self-pay

## 2019-08-09 ENCOUNTER — Encounter: Payer: Self-pay | Admitting: Student in an Organized Health Care Education/Training Program

## 2019-08-09 ENCOUNTER — Ambulatory Visit (INDEPENDENT_AMBULATORY_CARE_PROVIDER_SITE_OTHER): Payer: BC Managed Care – PPO | Admitting: Student in an Organized Health Care Education/Training Program

## 2019-08-09 VITALS — BP 98/62 | HR 109

## 2019-08-09 DIAGNOSIS — E119 Type 2 diabetes mellitus without complications: Secondary | ICD-10-CM

## 2019-08-09 DIAGNOSIS — E118 Type 2 diabetes mellitus with unspecified complications: Secondary | ICD-10-CM | POA: Diagnosis not present

## 2019-08-09 DIAGNOSIS — Z794 Long term (current) use of insulin: Secondary | ICD-10-CM

## 2019-08-09 DIAGNOSIS — Z09 Encounter for follow-up examination after completed treatment for conditions other than malignant neoplasm: Secondary | ICD-10-CM

## 2019-08-09 MED ORDER — INSULIN GLARGINE 100 UNIT/ML ~~LOC~~ SOLN: 10.0000 [IU] | Freq: Every day | SUBCUTANEOUS | 3 refills | Status: DC

## 2019-08-09 NOTE — Progress Notes (Signed)
   Subjective:    Patient ID: [name], male    DOB: 1966/01/29, 53 y.o.   MRN: [id number]  CC: hosp f/u  HPI:  No concerns or complaints today. Had recent hospital admission with lower extremity amputation.  He states that he has been adjusting well to this change and has less pain.  He has good family support and is able to transfer himself well at home.  Diabetes- patient's hemoglobin A1c was 6.0 during hospitalization.  He takes NovoLog with supper and then 10 units Lantus nightly.  Has had low blood sugars of 80 at home and sometimes in the 60s during dialysis.  Denies any symptoms of hypoglycemia.  CAD/CHF-patient denies any chest pain, shortness of breath, palpitations.  Has LVAD in place and denies any complications currently.  Following up with heart doctor next week.  Smoking status reviewed   ROS: pertinent noted in the HPI   I have personally reviewed pertinent past medical history, surgical, family, and social history as appropriate. Objective:  BP 98/62   Pulse (!) 109   SpO2 99%   Vitals and nursing note reviewed  General: NAD, pleasant, able to participate in exam Cardiac: electronic buzz/whir sound Respiratory: CTAB, normal effort, No wheezes, rales or rhonchi Extremities: bilateral amputations. Stumps without swelling or signs of infection. Abdomen: Soft, nontender to palpation. Skin: warm and dry, no rashes noted Neuro: alert, no obvious focal deficits Psych: Normal affect and mood  Assessment & Plan:   Diabetes mellitus type 2 in nonobese (HCC) Episodes of asymptomatic hypoglycemia and well controlled A1c. Discontinuing NovoLog today. Refilling Lantus. Asked patient to document and/or notify us for extremes of blood sugars in the highs or lows. Reassess A1c in approximately 3 months. May consider decreasing or discontinuing Lantus as well at that time.  Hospital discharge follow-up Doing well post amputation and discharge. Follow-up with  vascular surgery and cardiology  Meds ordered this encounter  Medications  . insulin glargine (LANTUS) 100 UNIT/ML injection    Sig: Inject 0.1 mLs (10 Units total) into the skin at bedtime.    Dispense:  10 mL    Refill:  [place], [place] PGY-2

## 2019-08-09 NOTE — Patient Instructions (Signed)
It was a pleasure to see you today!  To summarize our discussion for this visit:  Your blood sugars are very well controlled and possibly too well controlled as you have had some low blood sugars.  At this time we have discussed options and we are going to discontinue your 10 units of NovoLog with dinner.  He will continue to take your Lantus 10 units at night for now and will come in in 3 months from now to recheck your hemoglobin A1c.  Please let me know if you are experiencing blood sugar extremes in the high or the low.  Continue to check your blood sugars daily and monitor at dialysis.  Please follow-up with your heart doctor on Tuesday.  I am glad to hear that you are getting well.  I hope you have a happy holiday season.   Please return to our clinic to see me in about 2.5 months.  Call the clinic at [phone] if your symptoms worsen or you have any concerns.   Thank you for allowing me to take part in your care,  Dr. Doristine Mango

## 2019-08-12 ENCOUNTER — Telehealth (HOSPITAL_COMMUNITY): Payer: Self-pay | Admitting: Licensed Clinical Social Worker

## 2019-08-12 NOTE — Telephone Encounter (Signed)
CSW contacted patient to follow up on plan for family meeting scheduled for tomorrow post VAD clinic at 10am. Patient states he will have both children join him for the clinic visit. CSW will confirm with Palliative Care as well. CSW continues to follow for supportive intervention and goals of care. Raquel Sarna, [place], [place]

## 2019-08-13 ENCOUNTER — Encounter (HOSPITAL_COMMUNITY): Payer: Self-pay

## 2019-08-13 ENCOUNTER — Ambulatory Visit (HOSPITAL_COMMUNITY): Payer: Self-pay | Admitting: Pharmacist

## 2019-08-13 ENCOUNTER — Ambulatory Visit (HOSPITAL_COMMUNITY)
Admit: 2019-08-13 | Discharge: 2019-08-13 | Disposition: A | Discharge status: Home / Self Care | Payer: BC Managed Care – PPO | Source: Ambulatory Visit | Attending: Internal Medicine | Admitting: Internal Medicine

## 2019-08-13 ENCOUNTER — Other Ambulatory Visit: Payer: Self-pay

## 2019-08-13 VITALS — BP 111/71 | HR 98 | Temp 98.0°F

## 2019-08-13 DIAGNOSIS — I5022 Chronic systolic (congestive) heart failure: Secondary | ICD-10-CM | POA: Diagnosis present

## 2019-08-13 DIAGNOSIS — E1143 Type 2 diabetes mellitus with diabetic autonomic (poly)neuropathy: Secondary | ICD-10-CM | POA: Diagnosis not present

## 2019-08-13 DIAGNOSIS — K219 Gastro-esophageal reflux disease without esophagitis: Secondary | ICD-10-CM | POA: Insufficient documentation

## 2019-08-13 DIAGNOSIS — I132 Hypertensive heart and chronic kidney disease with heart failure and with stage 5 chronic kidney disease, or end stage renal disease: Secondary | ICD-10-CM | POA: Insufficient documentation

## 2019-08-13 DIAGNOSIS — I4892 Unspecified atrial flutter: Secondary | ICD-10-CM | POA: Diagnosis not present

## 2019-08-13 DIAGNOSIS — N183 Chronic kidney disease, stage 3 unspecified: Secondary | ICD-10-CM | POA: Diagnosis not present

## 2019-08-13 DIAGNOSIS — I252 Old myocardial infarction: Secondary | ICD-10-CM | POA: Insufficient documentation

## 2019-08-13 DIAGNOSIS — Z955 Presence of coronary angioplasty implant and graft: Secondary | ICD-10-CM | POA: Insufficient documentation

## 2019-08-13 DIAGNOSIS — Z992 Dependence on renal dialysis: Secondary | ICD-10-CM | POA: Diagnosis not present

## 2019-08-13 DIAGNOSIS — K5090 Crohn's disease, unspecified, without complications: Secondary | ICD-10-CM | POA: Insufficient documentation

## 2019-08-13 DIAGNOSIS — N186 End stage renal disease: Secondary | ICD-10-CM

## 2019-08-13 DIAGNOSIS — E1122 Type 2 diabetes mellitus with diabetic chronic kidney disease: Secondary | ICD-10-CM | POA: Insufficient documentation

## 2019-08-13 DIAGNOSIS — Z86718 Personal history of other venous thrombosis and embolism: Secondary | ICD-10-CM | POA: Insufficient documentation

## 2019-08-13 DIAGNOSIS — I739 Peripheral vascular disease, unspecified: Secondary | ICD-10-CM

## 2019-08-13 DIAGNOSIS — Z794 Long term (current) use of insulin: Secondary | ICD-10-CM | POA: Insufficient documentation

## 2019-08-13 DIAGNOSIS — K3184 Gastroparesis: Secondary | ICD-10-CM | POA: Insufficient documentation

## 2019-08-13 DIAGNOSIS — Z95811 Presence of heart assist device: Secondary | ICD-10-CM | POA: Diagnosis not present

## 2019-08-13 DIAGNOSIS — Z7901 Long term (current) use of anticoagulants: Secondary | ICD-10-CM | POA: Diagnosis not present

## 2019-08-13 DIAGNOSIS — Z7982 Long term (current) use of aspirin: Secondary | ICD-10-CM | POA: Diagnosis not present

## 2019-08-13 DIAGNOSIS — I255 Ischemic cardiomyopathy: Secondary | ICD-10-CM | POA: Insufficient documentation

## 2019-08-13 DIAGNOSIS — D6851 Activated protein C resistance: Secondary | ICD-10-CM | POA: Diagnosis not present

## 2019-08-13 DIAGNOSIS — I2510 Atherosclerotic heart disease of native coronary artery without angina pectoris: Secondary | ICD-10-CM | POA: Diagnosis not present

## 2019-08-13 DIAGNOSIS — Z79899 Other long term (current) drug therapy: Secondary | ICD-10-CM | POA: Diagnosis not present

## 2019-08-13 DIAGNOSIS — E785 Hyperlipidemia, unspecified: Secondary | ICD-10-CM | POA: Insufficient documentation

## 2019-08-13 LAB — POCT INR: INR: 2.8 (ref 2.0–3.0)

## 2019-08-13 NOTE — Progress Notes (Addendum)
Patient presents for hospital discharge follow up in [facility] today with his son and daughter. Reports no problems with VAD equipment or concerns with drive line.   Pt states that he has been feeling "pretty good" at home. He has been using his slide board for transfers at home. His son also lifts him when needed. Pt reports he had a fall last week. His son was pushing him in wheelchair down the temporary ramp outside his home and the temporary ramp collapsed and the wheelchair tipped over and he fell out. He denies injuries but states he was sore afterwards.   Denies pain today. Says he has not been requiring pain medication at home. Bilateral amputation sites are healing well. He is scheduled to have staples removed from left leg on December 17th.   Reports he has not had nausea or vomiting, but does say that he has started having bowel incontinence at night while he is sleeping. He is continent during while he is awake. Dr Aundra Dubin aware. May take Imodium at bedtime as needed for bowel incontinence. Cautioned against over use due to constipation risk.   Unable to draw labs today due to difficult venous access. Will fax lab orders to [facility].    Vital Signs:  Temp: 98.0 Doppler Pressure  100 Automatc BP: 111/71 HR:  98 SPO2: 100%  Weight: UTO lb w/o eqt Last weight: 200 lb   VAD Indication: Destination therapy- Pt is no longer candidate for heart/kidney transplant due to PAD per [facility].   VAD interrogation & Equipment Management: Speed: 5500 Flow: 4.0 Power: 4.5w    PI: 3.9  Alarms: 2 LOW VOLTAGE on 47/4 due to power flickering at home Events: 0-10 daily  Fixed speed 5500 Low speed limit: 5200  Primary Controller:  Replace back up battery in 13 months. Back up controller:   Replace back up battery in 21 months.  Annual Equipment Maintenance on UBC/PM was performed on 06/2017.   I reviewed the LVAD parameters from today and compared the results to  the patient's prior recorded data. LVAD interrogation was NEGATIVE for significant power changes, NEGATIVE for clinical alarms and STABLE for PI events/speed drops. No programming changes were made and pump is functioning within specified parameters. Pt is performing daily controller and system monitor self tests along with completing weekly and monthly maintenance for LVAD equipment.  LVAD equipment check completed and is in good working order. Back-up equipment present.  Exit Site Care: Drive line is being maintained weekly by daughter. CDI today. Anchor secure. Pt provided with 8 weekly kits for home use. Exit site healed and incorporated, the velour is fully implanted at exit site. No redness, tenderness, drainage, foul odor or rash noted.  Drive line anchor secure. Pt denies fever or chills.    Significant Events on VAD Support: -10/2016>> poss drive line infection, CT ABD neg, ID consult-doxy -11/09/16>> admit for poss drive line infection, IV abx -03/24/17>> doxy for poss drive line infection -05/04/17>> drive line debridement with wound-vac -06/2017>> drive line +proteus, IV abx, Bactrim x14 days  Device:St jude Dual Therapies: on 200 bpm Last check: 01/01/18 - 2 hrs Afl/Afib; EMI 04/02/18  BP & Labs: -  Doppler BP 100 - is reflecting MAP  Hgb not done today; difficult venous access - No S/S of bleeding. Specifically denies melena/BRBPR or nosebleeds.  LDH not done today, difficult venous access - established baseline of 245- 350. Denies tea-colored urine. No power elevations noted on interrogation.  1.5 yr Intermacs follow up completed including:  Quality of Life, KCCQ-12, and Neurocognitive trail making.   Unable to complete 6 minute walk due to bilateral amputations.   Back up controller: Backup equipment not present  Patient Instructions:  1. No medication changes today 2. Coumadin dosing per Ander Purpura PharmD 3. You may take an Imodium at bedtime every now and then to  help with night time bowel incontinence  4. Return to [place] [facility] in 2 months for follow up  [name] RN [place] Coordinator  Office: [phone]  24/7 Pager: [phone]     Cardiology: Dr. Aundra Dubin  HPI: 53 y.o. with history of CAD s/p anterior MI in 2010 and NSTEMI in 3/18 with DES to pRCA and mLAD and ischemic cardiomyopathy now s/p Heartmate 3 LVAD and ESRD presents for followup of CHF/LVAD.  Patient developed low output heart failure.  He was not deemed to be a good transplant candidate.  He was admitted in 10/18 and Heartmate 3 LVAD was placed.  He had baseline CKD stage 3 likely from diabetic nephropathy and CHF.  He developed peri-op AKI and ended up on dialysis.  No renal recovery to date.  St Jude ICD has been turned back on and ramp echo was done prior to discharge in 10/18, speed increased to 5600 rpm.   He was admitted in 11/18 with nausea/vomiting and abdominal pain.  This appears to have been due to diabetic gastroparesis (extensive GI evaluation). He had atrial flutter this admission that was converted to NSR with amiodarone.  He improved considerably with scheduled Reglan and erythromycin. He was re-admitted later in 11/18 with the same symptoms, suspect diabetic gastroparesis and was again treated with erythromycin.  He was not able to start domperidone due to prolonged QT interval. He had nausea again about a week ago but was able to manage at home with Phenergan suppositories.   Admitted 1/8 -> 09/16/17 for uncontrolled vomiting in setting of diabetic gastroparesis. Improved with phenergan suppositories and given erythromycin TID for 7 days post-discharge.    He was admitted in 2/19 for AV graft placement.  While in the hospital, we turned down his speed.  Since then, he has had considerably fewer PI events on HD days.    He was admitted in 4/19 with nausea/vomiting again, probably gastroparesis flare.   He was admitted in 5/19 with nausea/vomiting, likely gastroparesis  flare, treated with erythromycin course which he is finishing.   He has been evaluated at [facility] by Dr. Derrill Kay (gastroparesis specialist).   Gastric emptying study, surprisingly, was near normal. However, electrogastrogram showed poor gastric accomodation/capacity, "bradygastria."  Frequent small meals were recommended. Also, early am nausea was thought to be possible GERD.    Admitted 6/26 - 03/19/18 for emergent VAD exchange 03/01/18 in the setting of HM3 outflow graft kink. He required CVVHD post op, but was transtioned back to [facility] without problems. Course complicated by leukocytosis and fever. CT abd/chest negative for infection and blood cultures were negative. Post op pain well controlled withraretramadol. Able to tolerate diet. Evaluated by PT/OT and HH PT recommended for discharge. Ramp echo completed prior to discharge with speed optimization. Resumed81 mg ASA + coumadin with INR goal 2-3.  He was admitted 03/23/18 with nausea/gastroparesis symptoms and marked HTN at outpatient HD.  Erythromycin was restarted and gastroparesis symptoms were controlled with fall in BP and discharge on 03/25/18.   He was admitted in 9/19 for LUE AV fistula placement.  He was readmitted in 10/19 with  his typical gastroparesis symptoms, unable to take po. The fistula later failed and was replaced with a graft.  However, the graft was nonfunctional and developed a hematoma at the surgical site.    He was admitted in 12/19 with nausea/vomiting from HD. Also with LDH increased to 427 in setting of extensive hematoma around his left arm AV graft.  He has had tingling in his left hand and left grip is weak.    In 7/20, he was admitted with ischemic 4th toe on right foot.  Peripheral angiogram 03/18/19 showed 80% right SFA stenosis and occluded right PT. He had stenting of SFA and PTCA of PT with restoration of flow. However, he still needed amputation right 4th toe on 7/16 with intractable pain.  He was readmitted  with pain in his right foot, nausea, and vomiting. There was concern for ongoing right foot ischemia with all toes and a portion of the foot now dusky.  He was treated with IV vancomycin for possible osteomyelitis/infection, but thought that more likely ischemia could explain all the findings.  He was discharged home to followup with vascular surgery.   He was re-admitted in 8/20 with ongoing right foot pain and ended up having transmetatarsal amputation of the right foot.  Left leg angiogram was done with DES to left SFA, only 1 vessel runoff via severely diseased peroneal artery, unable to intervene.   He was readmitted in 9/20 with ongoing right foot/lower leg pain and had right AKA.  He was discharged to [facility].    He was then admitted with gangrene in the remnant of his left foot in 11/20.  He had left AKA.    He is now home.  Pain is under control and he is taking minimal pain medications.  No problems at HD.  His family is able to help him with transfers.  No lightheadedness or dyspnea though unable to be active due to bilateral AKA.  Reports taking Coumadin as prescribed and adherence to anticoagulation based dietary restrictions. Denies BRBPR or melena. No dark urine or hematuria.   Labs (11/18): hgb 10.4 => 8.7, LDH 213 Labs (08/04/17): hgb 9 Labs (1/19): HgbA1c 6.1 Labs (2/19): hgb 13.2 Labs (3/19): hgb 11.1 Labs (7/19): hgb 9.6 Labs (10/19: hgb 11.8 Labs (12/19): LDH 427 => 237, hgb 7.7 Labs (11/20): hgb 8.1  PMH: 1. Chronic systolic CHF: Ischemic cardiomyopathy. St Jude ICD.  - Echo (11/17) with EF 25-30%, moderate diastolic dysfunction, mild MR, mildly dilated RV with moderately decreased systolic function, peak RV-RA gradient 48 mmHg.  - Echo (3/18) with EF 25-30%, wall motion abnormalities, normal RV size and systolic function.  - Admission 3/18 with cardiogenic shock and AKI, required milrinone gtt.  - Echo (8/18) with EF 20-25%, mild LVH, moderate LV dilation,  mild MR.  - CPX (8/18) with peak VO2 12.4, VE/VCO2 slope 35, RER 1.05 => severe HR limitation.  - Heartmate 3 LVAD 10/18.  - Pump thrombosis in setting of HM3 outflow graft kink.  Pump exchange 6/19 for HM3.  2. CAD: Anterior MI 2005, had bifurcation stenting LAD/diagonal. Repeat cath 2010 with patent stents.  - NSTEMI 3/18: LHC with 99% ulcerated lesion proximal RCA, 95% mid LAD => PCI with DES to proximal RCA and mid LAD.  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  - Lower extremity venous dopplers (8/18): chronic DVT - V/Q scan (8/18): Normal, no PE.  4. Crohns disease: History of colectomy.  5. GERD 6. Diabetic gastroparesis: Admission 11/18 with abdominal pain,  nausea/vomiting.  - [facility] workup 2019: Gastric emptying study near normal.  Negative breath test (no evidence for small bowel bacterial overgrowth).  Electrogastrogram showed poor gastric accomodation/capacity, "bradygastria."  7. HTN 8. Hyperlipidemia 9. Type II diabetes  10. ESRD: Post-op LVAD surgery.  He has had failed AV fistula and AV graft, now dialyzing via catheter . 11. Carotid dopplers (8/18): mild bilateral stenosis.  12. PFTs (8/18): Mild restriction (no IDL on CT chest).  13. Lung nodules: CT chest 8/18 with small bilateral nodules.  14. PAD: ABIs with moderate bilateral disease in 8/18.  - Peripheral angiogram 03/18/19 showed 80% right SFA stenosis and occluded right PT. He had stenting of SFA and PTCA of PT with restoration of flow. However, he still needed amputation right 4th toe on 03/21/19 with intractable pain.  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  - Peripheral angiogram left leg: DES to left SFA, only 1 vessel runoff via severely diseased peroneal artery, unable to intervene. 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  - Left AKA 11/20.  15. Atrial flutter: In 11/18 associated with nausea/vomiting from gastroparesis.   Current Outpatient Medications  Medication Sig Dispense Refill  . aspirin EC 81 MG tablet Take 1 tablet (81 mg  total) by mouth daily. 90 tablet 3  . atorvastatin (LIPITOR) 40 MG tablet Take 1 tablet (40 mg total) by mouth daily at 6 PM. 30 tablet 6  . calcitRIOL (ROCALTROL) 0.5 MCG capsule Take 1 capsule (0.5 mcg total) by mouth every Monday, Wednesday, and Friday. 15 capsule 5  . citalopram (CELEXA) 20 MG tablet Take 1 tablet (20 mg total) by mouth daily. 30 tablet 6  . dronabinol (MARINOL) 2.5 MG capsule Take 1 capsule (2.5 mg total) by mouth 2 (two) times daily before lunch and supper. 60 capsule 5  . erythromycin (ERYTHROCIN) 250 MG tablet Take 250 mg by mouth 3 (three) times daily.    [name] gabapentin (NEURONTIN) 300 MG capsule Take 1 capsule (300 mg total) by mouth 2 (two) times daily. 60 capsule 2  . insulin glargine (LANTUS) 100 UNIT/ML injection Inject 0.1 mLs (10 Units total) into the skin at bedtime. 10 mL 3  . LORazepam (ATIVAN) 0.5 MG tablet Take 1 tablet (0.5 mg total) by mouth every 12 (twelve) hours as needed (nausea). 60 tablet 2  . metoCLOPramide (REGLAN) 10 MG tablet Take 1 tablet (10 mg total) by mouth 3 (three) times daily. (Patient taking differently: Take 10 mg by mouth 3 (three) times daily with meals. ) 90 tablet 6  . midodrine (PROAMATINE) 5 MG tablet Take 1 tablet (5 mg total) by mouth daily. Take 1 tablet before dialysis treatment (Patient taking differently: Take 5 mg by mouth every Monday, Wednesday, and Friday. Take 1 tablet before dialysis treatment) 30 tablet 6  . multivitamin (RENA-VIT) TABS tablet Take 1 tablet by mouth daily.    . pantoprazole (PROTONIX) 40 MG tablet Take 1 tablet (40 mg total) by mouth 2 (two) times daily before lunch and supper. 60 tablet 11  . promethazine (PHENERGAN) 12.5 MG tablet Take 1 tablet (12.5 mg total) by mouth every 6 (six) hours as needed for nausea or vomiting. 30 tablet 3  . sevelamer carbonate (RENVELA) 0.8 g PACK packet Take 0.8 g by mouth 3 (three) times daily with meals. 270 each 1  . traMADol (ULTRAM) 50 MG tablet Take 1 tablet (50 mg  total) by mouth every 6 (six) hours as needed. 20 tablet 0  . warfarin (COUMADIN) 5 MG tablet Take 1 tablet (  5 mg total) by mouth daily. 30 tablet 11  . docusate sodium (COLACE) 100 MG capsule Take 1 capsule (100 mg total) by mouth daily as needed for mild constipation. (Patient not taking: Reported on 08/13/2019) 10 capsule 0  . oxyCODONE (ROXICODONE) 5 MG immediate release tablet Take 1 tablet (5 mg total) by mouth 2 (two) times daily as needed for severe pain. Take 1-2 tablets every 4-6 hours for severe pain. (Patient not taking: Reported on 08/13/2019) 30 tablet 0   No current facility-administered medications for this encounter.     Metformin and related   Review of systems complete and found to be negative unless listed in HPI.    BP 111/71 Comment: map 95  Pulse 98   Temp 98 F (36.7 C) (Oral)   SpO2 100%  MAP 91  VAD interrogation & Equipment Management: See LVAD nurse's note above.   General: Well appearing this am. NAD.  HEENT: Normal. Neck: Supple, JVP 7-8 cm. Carotids OK.  Cardiac:  Mechanical heart sounds with LVAD hum present.  Lungs:  CTAB, normal effort.  Abdomen:  NT, ND, no HSM. No bruits or masses. +BS  LVAD exit site: Well-healed and incorporated. Dressing dry and intact. No erythema or drainage. Stabilization device present and accurately applied. Driveline dressing changed daily per sterile technique. Extremities:  S/p bilateral AKA.  Neuro:  Alert & oriented x 3. Cranial nerves grossly intact. Moves all 4 extremities w/o difficulty. Affect pleasant    ASSESSMENT AND PLAN: 1. PAD/ischemic feet:  He is now s/p bilateral AKAs.  Coping reasonably well, family able to help with transfers.  2. Chronic systolic CHF: Ischemic cardiomyopathy. St Jude ICD. NSTEMI in March 2018 with DES to LAD and RCA, complicated by cardiogenic shock, low output requiring milrinone. Echo 8/18 with EF 20-25%, moderate MR. CPX (8/18) with severe functional impairment due to HF. He is  s/p HM3 LVAD placement. He had pump thrombosis 6/19 due to [facility] outflow graft kinking. He had pump exchange for another HM3.   - Volume managed at dialysis, MWF.   - Continue ASA 81and warfarin with INR goal 2-2.5.    - He had been referred to [facility] for evaluation for heart/kidney transplant, but he is not candidate for transplantnowgiven extensive PAD. 3. ESRD: HD on M-W-F with midodrine pre-HD.  4.CAD: NSTEMI in 3/18. LHC with 99% ulcerated lesion proximal RCA with left to right collaterals, 95% mid LAD stenosis after mid LAD stent. s/p PCI to RCA and LAD on 11/25/16.No chest pain.  - Continue statin and ASA. 5. h/o DVTs: Factor V Leiden heterozygote. -Anticoagulated. 6. Diabetic gastroparesis: He has seen gastroparesis specialist at [facility]. Surprisingly, gastric emptying study was normal. However, electrogastrogram showed poor gastric accomodation/capacity. Therefore, small frequent meals recommended. - Continue reglan 10 mg tid/ac and Marinol.  - Continue Protonix bid.  - He will continue erythromycinindefinitely.  - Limit narcotic use as much as possible.  7. FHQ:RFXJOITGP stable off hydralazine.     Sekou Zuckerman 08/13/2019

## 2019-08-13 NOTE — Progress Notes (Signed)
CSW met with patient and two adult children along with Palliative Care. Discussed at length patient's history, medical status, ongoing care needs and goals of care. Palliative Care led conversations and patient acknowledged that "I know I have more days behind me than I have a head of me". Patient shared concerns about his younger 54yo son who is at home and unsure how he is processing all of the medical attention needs of patient over the past 5 months. Patient states "I have been hospitalized more than home". Palliative Care suggested referral to [facility] for some home visits to meet with 78yo son and continue to goals of care conversations in the home. Patient and adult children all agreeable to plan. CSW followed up with Dr. Aundra Dubin to inform of progress of meeting and referral for Palliative Care services in the home. CSW will continue to follow through VAD clinic. Raquel Sarna, [place], [place]

## 2019-08-13 NOTE — Progress Notes (Signed)
LVAD INR 

## 2019-08-13 NOTE — Patient Instructions (Signed)
1. No medication changes today 2. Coumadin dosing per Ander Purpura PharmD 3. You may take an Imodium at bedtime every now and then to help with night time bowel incontinence  4. Return to [place] clinic in 2 months for follow up

## 2019-08-15 NOTE — Assessment & Plan Note (Signed)
Episodes of asymptomatic hypoglycemia and well controlled A1c. Discontinuing NovoLog today. Refilling Lantus. Asked patient to document and/or notify us for extremes of blood sugars in the highs or lows. Reassess A1c in approximately 3 months. May consider decreasing or discontinuing Lantus as well at that time.

## 2019-08-15 NOTE — Assessment & Plan Note (Signed)
Doing well post amputation and discharge. Follow-up with vascular surgery and cardiology

## 2019-08-20 ENCOUNTER — Ambulatory Visit (HOSPITAL_COMMUNITY): Payer: Self-pay | Admitting: Pharmacist

## 2019-08-20 LAB — POCT INR: INR: 2.5 (ref 2.0–3.0)

## 2019-08-20 NOTE — Progress Notes (Signed)
LVAD INR 

## 2019-08-22 ENCOUNTER — Other Ambulatory Visit: Payer: Self-pay

## 2019-08-22 ENCOUNTER — Ambulatory Visit (INDEPENDENT_AMBULATORY_CARE_PROVIDER_SITE_OTHER): Payer: Self-pay | Admitting: Physician Assistant

## 2019-08-22 VITALS — BP 123/89 | HR 89 | Temp 98.3°F | Resp 20 | Ht 68.0 in | Wt 176.0 lb

## 2019-08-22 DIAGNOSIS — I739 Peripheral vascular disease, unspecified: Secondary | ICD-10-CM

## 2019-08-22 NOTE — Progress Notes (Signed)
POST OPERATIVE OFFICE NOTE    Past surgical history: [name] is a 53 y.o. 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 Donnetta Hutching for  gangrene right or foot. He is also s/p Left great toe amputation including metatarsal head on 05-29-19 by Dr. Trula Slade for Left great toe ischemia.  He is also s/p placement of Left SFA stent (6 mm x 60 mm drug-coatedEluviastentpostdilated with a 5 mm Mustang) on 05-02-19 by Dr. Carlis Abbott for critical limb ischemia left lower extremity with tissue loss.       HPI:  This is a 53 y.o. male who is here today for follow up B AKA incision checks and removal of left AKA staples.  He denise drainage and pain B.  He did fall out of his WC at HD, but the contusion and pain on the right stump is improving.     Allergies  Allergen Reactions  . Metformin And Related Diarrhea    Current Outpatient Medications  Medication Sig Dispense Refill  . aspirin EC 81 MG tablet Take 1 tablet (81 mg total) by mouth daily. 90 tablet 3  . atorvastatin (LIPITOR) 40 MG tablet Take 1 tablet (40 mg total) by mouth daily at 6 PM. 30 tablet 6  . calcitRIOL (ROCALTROL) 0.5 MCG capsule Take 1 capsule (0.5 mcg total) by mouth every Monday, Wednesday, and Friday. 15 capsule 5  . citalopram (CELEXA) 20 MG tablet Take 1 tablet (20 mg total) by mouth daily. 30 tablet 6  . dronabinol (MARINOL) 2.5 MG capsule Take 1 capsule (2.5 mg total) by mouth 2 (two) times daily before lunch and supper. 60 capsule 5  . erythromycin (ERYTHROCIN) 250 MG tablet Take 250 mg by mouth 3 (three) times daily.    [name] gabapentin (NEURONTIN) 300 MG capsule Take 1 capsule (300 mg total) by mouth 2 (two) times daily. 60 capsule 2  . insulin glargine (LANTUS) 100 UNIT/ML injection Inject 0.1 mLs (10 Units total) into the skin at bedtime. 10 mL 3  . LORazepam (ATIVAN) 0.5 MG tablet Take 1 tablet (0.5 mg total) by mouth every 12 (twelve) hours as needed (nausea). 60 tablet 2  . metoCLOPramide (REGLAN) 10  MG tablet Take 1 tablet (10 mg total) by mouth 3 (three) times daily. (Patient taking differently: Take 10 mg by mouth 3 (three) times daily with meals. ) 90 tablet 6  . midodrine (PROAMATINE) 5 MG tablet Take 1 tablet (5 mg total) by mouth daily. Take 1 tablet before dialysis treatment (Patient taking differently: Take 5 mg by mouth every Monday, Wednesday, and Friday. Take 1 tablet before dialysis treatment) 30 tablet 6  . multivitamin (RENA-VIT) TABS tablet Take 1 tablet by mouth daily.    [name] oxyCODONE (ROXICODONE) 5 MG immediate release tablet Take 1 tablet (5 mg total) by mouth 2 (two) times daily as needed for severe pain. Take 1-2 tablets every 4-6 hours for severe pain. (Patient not taking: Reported on 08/13/2019) 30 tablet 0  . pantoprazole (PROTONIX) 40 MG tablet Take 1 tablet (40 mg total) by mouth 2 (two) times daily before lunch and supper. 60 tablet 11  . promethazine (PHENERGAN) 12.5 MG tablet Take 1 tablet (12.5 mg total) by mouth every 6 (six) hours as needed for nausea or vomiting. 30 tablet 3  . sevelamer carbonate (RENVELA) 0.8 g PACK packet Take 0.8 g by mouth 3 (three) times daily with meals. 270 each 1  . traMADol (ULTRAM) 50 MG tablet Take 1 tablet (50  mg total) by mouth every 6 (six) hours as needed. 20 tablet 0  . warfarin (COUMADIN) 5 MG tablet Take 1 tablet (5 mg total) by mouth daily. 30 tablet 11   No current facility-administered medications for this visit.     ROS:  See HPI  Physical Exam:    Incision:  Right stump warm and well healed.  Left AKA staples removed patient tolerated this well.  Stump is warm and well healed.   Lungs non labored breathing Heart LVAD  Gen NAD    Assessment/Plan:  This is a 53 y.o. male who is s/p:B AKA well healed without phantom pain noted.    Activity as tolerates f/u as needed.  He is on HD via [facility].     [name] , PA-C [facility] [phone]  Clinic MD:  Scot Dock

## 2019-08-27 ENCOUNTER — Ambulatory Visit (HOSPITAL_COMMUNITY): Payer: Self-pay | Admitting: Pharmacist

## 2019-08-27 LAB — POCT INR: INR: 2.2 (ref 2.0–3.0)

## 2019-08-27 NOTE — Progress Notes (Signed)
LVAD INR 

## 2019-09-03 ENCOUNTER — Ambulatory Visit (HOSPITAL_COMMUNITY): Payer: Self-pay | Admitting: Pharmacist

## 2019-09-03 LAB — POCT INR: INR: 2.5 (ref 2.0–3.0)

## 2019-09-03 NOTE — Progress Notes (Signed)
LVAD INR 

## 2019-09-10 ENCOUNTER — Ambulatory Visit (HOSPITAL_COMMUNITY): Payer: Self-pay | Admitting: Pharmacist

## 2019-09-10 LAB — POCT INR: INR: 2.3 (ref 2.0–3.0)

## 2019-09-10 NOTE — Progress Notes (Signed)
LVAD INR 

## 2019-09-17 ENCOUNTER — Ambulatory Visit (HOSPITAL_COMMUNITY): Payer: Self-pay | Admitting: Pharmacist

## 2019-09-17 LAB — POCT INR: INR: 2.6 (ref 2.0–3.0)

## 2019-09-17 NOTE — Progress Notes (Signed)
LVAD INR 

## 2019-09-18 ENCOUNTER — Ambulatory Visit (INDEPENDENT_AMBULATORY_CARE_PROVIDER_SITE_OTHER): Payer: BC Managed Care – PPO | Admitting: Family Medicine

## 2019-09-18 ENCOUNTER — Other Ambulatory Visit: Payer: Self-pay

## 2019-09-18 VITALS — BP 126/80 | HR 105

## 2019-09-18 DIAGNOSIS — Z113 Encounter for screening for infections with a predominantly sexual mode of transmission: Secondary | ICD-10-CM

## 2019-09-18 DIAGNOSIS — N485 Ulcer of penis: Secondary | ICD-10-CM | POA: Diagnosis not present

## 2019-09-18 MED ORDER — VALACYCLOVIR HCL 1 G PO TABS: 1000.0000 mg | ORAL_TABLET | Freq: Two times a day (BID) | ORAL | 0 refills | Status: AC

## 2019-09-18 MED ORDER — SILVER SULFADIAZINE 1 % EX CREA: 1.0000 "application " | TOPICAL_CREAM | Freq: Every day | CUTANEOUS | 0 refills | Status: DC

## 2019-09-18 NOTE — Progress Notes (Signed)
   Subjective:   Patient ID: [name]    DOB: 12/20/1965, 54 y.o. male   MRN: [id number]  [name] is a 54 y.o. male  here for penile lesions and pain.  Penile Lesions and Pain: Patient presenting with "abrasions on penis" near head of penis x 2-3 weeks, they are very painful. Denies being sexually active. Denies any recent trauma. No discharge or bleeding. Describes the spots as few red spots and few white spots. Denies any fevers or chills. Denies dysuria or urinary symptoms. No other rashes anywhere else. Denise any history of STDs. Has tried to treat the pain with Tramadol.  Review of Systems:  Per HPI.   [place], medications and smoking status reviewed.  Objective:   BP 126/80   Pulse (!) 105   SpO2 99%  Vitals and nursing note reviewed.  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 HSV and bacterial culture obtained. HIV and RPR negative. Will empirically treat with Valtrex 1g BID x 10 days. Silvadene cream to apply to area. Will offer alternative treatment pending lab results. Patient can treat pain with OTC tylenol or his rx of Tramadol.   Orders Placed This Encounter  Procedures  . Herpes simplex virus culture  . WOUND CULTURE    Order Specific Question:   Source    Answer:   penis  . HIV antibody (with reflex)  . RPR   Meds ordered this encounter  Medications  . valACYclovir (VALTREX) 1000 MG tablet    Sig: Take 1 tablet (1,000 mg  total) by mouth 2 (two) times daily for 10 days.    Dispense:  20 tablet    Refill:  0  . silver sulfADIAZINE (SILVADENE) 1 % cream    Sig: Apply 1 application topically daily.    Dispense:  50 g    Refill:  0    Mina Marble, DO PGY-2, [facility] 09/19/2019 11:00 PM

## 2019-09-18 NOTE — Patient Instructions (Signed)
I think your ulcers are from a virus. Please take Valtrex twice a day for 10 days. Please return if there is no improvement.  I have also collected some labs. I will call you with the results.  Take care, Dr. Tarry Kos

## 2019-09-19 DIAGNOSIS — N485 Ulcer of penis: Secondary | ICD-10-CM | POA: Insufficient documentation

## 2019-09-19 LAB — HIV ANTIBODY (ROUTINE TESTING W REFLEX): HIV Screen 4th Generation wRfx: NONREACTIVE

## 2019-09-19 LAB — RPR: RPR Ser Ql: NONREACTIVE

## 2019-09-19 NOTE — Assessment & Plan Note (Signed)
Physical exam most concerning for HSV infection with multiple painful genital ulcers at base of glans penis. HSV and bacterial culture obtained. HIV and RPR negative. Will empirically treat with Valtrex 1g BID x 10 days. Silvadene cream to apply to area. Will offer alternative treatment pending lab results. Patient can treat pain with OTC tylenol or his rx of Tramadol.

## 2019-09-21 ENCOUNTER — Other Ambulatory Visit (HOSPITAL_COMMUNITY): Payer: Self-pay | Admitting: *Deleted

## 2019-09-21 ENCOUNTER — Telehealth (HOSPITAL_COMMUNITY): Payer: Self-pay | Admitting: *Deleted

## 2019-09-21 DIAGNOSIS — L8990 Pressure ulcer of unspecified site, unspecified stage: Secondary | ICD-10-CM

## 2019-09-21 DIAGNOSIS — Z95811 Presence of heart assist device: Secondary | ICD-10-CM

## 2019-09-21 MED ORDER — COLLAGENASE 250 UNIT/GM EX OINT: 1.0000 "application " | TOPICAL_OINTMENT | Freq: Every day | CUTANEOUS | 0 refills | Status: DC

## 2019-09-21 NOTE — Telephone Encounter (Signed)
Received page from patient's daughter this morning stating that [name] "thinks he may have a bedsore on his butt." I asked [name] to assess to see if wound present. Per daughter he has "a dime size spot that is open with pink in the middle and the skin around the edges looks intact." Dr Aundra Dubin made aware. Order for Santyl cream received. Instructed [name] to cover area with a padded dressing and to cover wound bed with Santyl cream once daily. Instructed her to make sure [name] is moving around/shifting his weight as much as possible to avoid further breakdown or worsening of wound. Plan for him to come to clinic after dialysis on Monday for VAD coordinator to assess wound. She verbalized understanding of all the above. Prescription sent in for Santyl cream.  [name] RN [place] Coordinator  Office: [phone]  24/7 Pager: [phone]

## 2019-09-22 LAB — WOUND CULTURE

## 2019-09-23 ENCOUNTER — Ambulatory Visit (HOSPITAL_COMMUNITY)
Admission: RE | Admit: 2019-09-23 | Discharge: 2019-09-23 | Disposition: A | Discharge status: Home / Self Care | Payer: BC Managed Care – PPO | Source: Ambulatory Visit | Attending: Internal Medicine | Admitting: Internal Medicine

## 2019-09-23 ENCOUNTER — Other Ambulatory Visit: Payer: Self-pay

## 2019-09-23 ENCOUNTER — Encounter (HOSPITAL_COMMUNITY): Payer: Self-pay

## 2019-09-23 VITALS — BP 121/97

## 2019-09-23 DIAGNOSIS — Z79899 Other long term (current) drug therapy: Secondary | ICD-10-CM | POA: Insufficient documentation

## 2019-09-23 DIAGNOSIS — Z89611 Acquired absence of right leg above knee: Secondary | ICD-10-CM | POA: Diagnosis not present

## 2019-09-23 DIAGNOSIS — K3184 Gastroparesis: Secondary | ICD-10-CM | POA: Insufficient documentation

## 2019-09-23 DIAGNOSIS — I130 Hypertensive heart and chronic kidney disease with heart failure and stage 1 through stage 4 chronic kidney disease, or unspecified chronic kidney disease: Secondary | ICD-10-CM | POA: Diagnosis not present

## 2019-09-23 DIAGNOSIS — Z95811 Presence of heart assist device: Secondary | ICD-10-CM | POA: Diagnosis not present

## 2019-09-23 DIAGNOSIS — N4889 Other specified disorders of penis: Secondary | ICD-10-CM

## 2019-09-23 DIAGNOSIS — K219 Gastro-esophageal reflux disease without esophagitis: Secondary | ICD-10-CM | POA: Insufficient documentation

## 2019-09-23 DIAGNOSIS — I5022 Chronic systolic (congestive) heart failure: Secondary | ICD-10-CM | POA: Diagnosis present

## 2019-09-23 DIAGNOSIS — E1143 Type 2 diabetes mellitus with diabetic autonomic (poly)neuropathy: Secondary | ICD-10-CM | POA: Insufficient documentation

## 2019-09-23 DIAGNOSIS — I739 Peripheral vascular disease, unspecified: Secondary | ICD-10-CM

## 2019-09-23 DIAGNOSIS — E785 Hyperlipidemia, unspecified: Secondary | ICD-10-CM | POA: Insufficient documentation

## 2019-09-23 DIAGNOSIS — D6851 Activated protein C resistance: Secondary | ICD-10-CM | POA: Diagnosis not present

## 2019-09-23 DIAGNOSIS — I255 Ischemic cardiomyopathy: Secondary | ICD-10-CM | POA: Insufficient documentation

## 2019-09-23 DIAGNOSIS — I252 Old myocardial infarction: Secondary | ICD-10-CM | POA: Diagnosis not present

## 2019-09-23 DIAGNOSIS — Z86718 Personal history of other venous thrombosis and embolism: Secondary | ICD-10-CM | POA: Insufficient documentation

## 2019-09-23 DIAGNOSIS — N485 Ulcer of penis: Secondary | ICD-10-CM

## 2019-09-23 DIAGNOSIS — L89314 Pressure ulcer of right buttock, stage 4: Secondary | ICD-10-CM | POA: Insufficient documentation

## 2019-09-23 DIAGNOSIS — Z794 Long term (current) use of insulin: Secondary | ICD-10-CM | POA: Insufficient documentation

## 2019-09-23 DIAGNOSIS — N186 End stage renal disease: Secondary | ICD-10-CM | POA: Diagnosis not present

## 2019-09-23 DIAGNOSIS — E1151 Type 2 diabetes mellitus with diabetic peripheral angiopathy without gangrene: Secondary | ICD-10-CM | POA: Diagnosis not present

## 2019-09-23 DIAGNOSIS — Z7901 Long term (current) use of anticoagulants: Secondary | ICD-10-CM | POA: Insufficient documentation

## 2019-09-23 DIAGNOSIS — I2510 Atherosclerotic heart disease of native coronary artery without angina pectoris: Secondary | ICD-10-CM | POA: Diagnosis not present

## 2019-09-23 DIAGNOSIS — I4892 Unspecified atrial flutter: Secondary | ICD-10-CM | POA: Insufficient documentation

## 2019-09-23 DIAGNOSIS — R570 Cardiogenic shock: Secondary | ICD-10-CM | POA: Insufficient documentation

## 2019-09-23 DIAGNOSIS — Z792 Long term (current) use of antibiotics: Secondary | ICD-10-CM | POA: Insufficient documentation

## 2019-09-23 DIAGNOSIS — E1122 Type 2 diabetes mellitus with diabetic chronic kidney disease: Secondary | ICD-10-CM | POA: Insufficient documentation

## 2019-09-23 DIAGNOSIS — Z9049 Acquired absence of other specified parts of digestive tract: Secondary | ICD-10-CM | POA: Diagnosis not present

## 2019-09-23 DIAGNOSIS — K5090 Crohn's disease, unspecified, without complications: Secondary | ICD-10-CM | POA: Diagnosis not present

## 2019-09-23 DIAGNOSIS — Z89612 Acquired absence of left leg above knee: Secondary | ICD-10-CM | POA: Diagnosis not present

## 2019-09-23 DIAGNOSIS — Z7982 Long term (current) use of aspirin: Secondary | ICD-10-CM | POA: Insufficient documentation

## 2019-09-23 MED ORDER — DOXYCYCLINE HYCLATE 100 MG PO TABS: 100.0000 mg | ORAL_TABLET | Freq: Two times a day (BID) | ORAL | 0 refills | Status: AC

## 2019-09-23 NOTE — Progress Notes (Addendum)
Patient presents for sick visit follow up in [name] today with his son. Reports no problems with VAD equipment or concerns with drive line.   Pt states that he has been feeling "pretty good" at home. He has been using his slide board for transfers at home. His son also lifts him when needed.   Complaining of severe penis pain. He states he has had "rash and ulcer" on his penis for roughly 2 weeks. He was seen by his PCP 1/13 and tested for STDs and a wound culture was sent. Testing negative other than wound culture positive for rate gram positive cocci. PCP MD prescribed Valtrex 1gm BID x 10 days and Silvadene cream. States that today he had to have his dialysis treatment cut short because pain was intolerable. Pt writhing around on stretcher in clinic room. Dr Aundra Dubin aware and in to see patient. Per Dr Aundra Dubin will send in prescription for Doxycycline 55m BID x 14 days. Prescription given to patient for Percocet 5-329m#20 tabs. Instructed patient to contact PCP if infection not resolved with antibiotics.   Patient has new 1 cm x 1 cm Stage II pressure ulcer on his inner right buttock. Placed Allevyn  3 in x 3 in pink foam over area. Provided patient with several foams for home use. Instructed to changed every 3-4 days and if soiled or wet.      Per patient he "felt like I had a stoke yesterday." He felt like his speech was slurred yesterday. He nor his family called the VAD pager or brought him to ER. Moving all extremities, grips equal, smile symmetrical, able to stick out tongue to command. Speech today is almost at baseline, pt says his "tongue feels heavy." Educated importance of recognizing signs if a stroke and notifying VAD coordinator if stroke symptoms present. Pt and son verbalized understanding.   BP elevated today due to pain.   Vital Signs:  Temp:  Doppler Pressure  130 Automatc BP: 121/97 (107) HR:  98 SPO2: 100%  Weight: UTO lb w/o eqt Last weight: 200 lb   VAD  Indication: Destination therapy- Pt is no longer candidate for heart/kidney transplant due to PAD per [facility].   VAD interrogation & Equipment Management: Speed: 5500 Flow: 4.2 Power: 4.6w    PI: 6.0  Alarms:none Events: 5-20 daily  Fixed speed 5500 Low speed limit: 5200  Primary Controller:  Replace back up battery in 12 months. Back up controller:   Replace back up battery in 21 months.  Annual Equipment Maintenance on UBC/PM was performed on 06/2017.   I reviewed the LVAD parameters from today and compared the results to the patient's prior recorded data. LVAD interrogation was NEGATIVE for significant power changes, NEGATIVE for clinical alarms and STABLE for PI events/speed drops. No programming changes were made and pump is functioning within specified parameters. Pt is performing daily controller and system monitor self tests along with completing weekly and monthly maintenance for LVAD equipment.  LVAD equipment check completed and is in good working order. Back-up equipment present.  Exit Site Care: Drive line is being maintained weekly by daughter. CDI today. Anchor secure. Exit site healed and incorporated, the velour is fully implanted at exit site. No redness, tenderness, drainage, foul odor or rash noted.  Drive line anchor secure. Pt has adequate dressing supplies for home use. Pt denies fever or chills.    Significant Events on VAD Support: -10/2016>> poss drive line infection, CT ABD neg, ID consult-doxy -11/09/16>> admit for  poss drive line infection, IV abx -03/24/17>> doxy for poss drive line infection -05/04/17>> drive line debridement with wound-vac -06/2017>> drive line +proteus, IV abx, Bactrim x14 days  Device:St jude Dual Therapies: on 200 bpm Last check: 01/01/18 - 2 hrs Afl/Afib; EMI 04/02/18  BP & Labs: -  Doppler BP 130 - is reflecting MAP  Hgb not done today; difficult venous access. Result 13.9 from dialysis labs 09/19/19 - No S/S of  bleeding. Specifically denies melena/BRBPR or nosebleeds.  LDH not done today, difficult venous access. Result 280 from dialysis labs 09/19/19 - established baseline of 245- 350. Denies tea-colored urine. No power elevations noted on interrogation.   Patient Instructions:  1. Start Doxycyline 110m twice a day for 14 days for penile infection 2. You may take Percocet 5-325 1 tablet every 6 hours as needed for penile pain 3. Place allevyn pink foam on ulcer on right buttcheek. Change this every few days or as needed when soiled or wet. Make sure you are changing positions and moving frequently to prevent further breakdown 4. [facility] for follow up  AEmerson MonteRN VP[place]attamieCoordinator  Office: [phone] 24/7 Pager: [phone]     Cardiology: Dr. MAundra Dubin HPI: 54y.o. with history of CAD s/p anterior MI in 2010 and NSTEMI in 3/18 with DES to pRCA and mLAD and ischemic cardiomyopathy now s/p Heartmate 3 LVAD and ESRD presents for followup of CHF/LVAD.  Patient developed low output heart failure.  He was not deemed to be a good transplant candidate.  He was admitted in 10/18 and Heartmate 3 LVAD was placed.  He had baseline CKD stage 3 likely from diabetic nephropathy and CHF.  He developed peri-op AKI and ended up on dialysis.  No renal recovery to date.  St Jude ICD has been turned back on and ramp echo was done prior to discharge in 10/18, speed increased to 5600 rpm.   He was admitted in 11/18 with nausea/vomiting and abdominal pain.  This appears to have been due to diabetic gastroparesis (extensive GI evaluation). He had atrial flutter this admission that was converted to NSR with amiodarone.  He improved considerably with scheduled Reglan and erythromycin. He was re-admitted later in 11/18 with the same symptoms, suspect diabetic gastroparesis and was again treated with erythromycin.  He was not able to start domperidone due to prolonged QT interval. He had  nausea again about a week ago but was able to manage at home with Phenergan suppositories.   Admitted 1/8 -> 09/16/17 for uncontrolled vomiting in setting of diabetic gastroparesis. Improved with phenergan suppositories and given erythromycin TID for 7 days post-discharge.    He was admitted in 2/19 for AV graft placement.  While in the hospital, we turned down his speed.  Since then, he has had considerably fewer PI events on HD days.    He was admitted in 4/19 with nausea/vomiting again, probably gastroparesis flare.   He was admitted in 5/19 with nausea/vomiting, likely gastroparesis flare, treated with erythromycin course which he is finishing.   [facility]   Gastric emptying study, surprisingly, was near normal. However, electrogastrogram showed poor gastric accomodation/capacity, "bradygastria."  Frequent small meals were recommended. Also, early am nausea was thought to be possible GERD.    Admitted 6/26 - 03/19/18 for emergent VAD exchange 03/01/18 in the setting of HM3 outflow graft kink. He required CVVHD post op, but was  transtioned back to iHD without problems. Course complicated by leukocytosis and fever. CT abd/chest negative for infection and blood cultures were negative. Post op pain well controlled withraretramadol. Able to tolerate diet. Evaluated by PT/OT and HH PT recommended for discharge. Ramp echo completed prior to discharge with speed optimization. Resumed81 mg ASA + coumadin with INR goal 2-3.  He was admitted 03/23/18 with nausea/gastroparesis symptoms and marked HTN at outpatient HD.  Erythromycin was restarted and gastroparesis symptoms were controlled with fall in BP and discharge on 03/25/18.   He was admitted in 9/19 for LUE AV fistula placement.  He was readmitted in 10/19 with his typical gastroparesis symptoms, unable to take po. The fistula later failed and was replaced with a graft.  However, the  graft was nonfunctional and developed a hematoma at the surgical site.    He was admitted in 12/19 with nausea/vomiting from HD. Also with LDH increased to 427 in setting of extensive hematoma around his left arm AV graft.  He has had tingling in his left hand and left grip is weak.    In 7/20, he was admitted with ischemic 4th toe on right foot.  Peripheral angiogram 03/18/19 showed 80% right SFA stenosis and occluded right PT. He had stenting of SFA and PTCA of PT with restoration of flow. However, he still needed amputation right 4th toe on 7/16 with intractable pain.  He was readmitted with pain in his right foot, nausea, and vomiting. There was concern for ongoing right foot ischemia with all toes and a portion of the foot now dusky.  He was treated with IV vancomycin for possible osteomyelitis/infection, but thought that more likely ischemia could explain all the findings.  He was discharged home to followup with vascular surgery.   He was re-admitted in 8/20 with ongoing right foot pain and ended up having transmetatarsal amputation of the right foot.  Left leg angiogram was done with DES to left SFA, only 1 vessel runoff via severely diseased peroneal artery, unable to intervene.   He was readmitted in 9/20 with ongoing right foot/lower leg pain and had right AKA.  He was [facility] to [facility].    He was then admitted with gangrene in the remnant of his left foot in 11/20.  He had left AKA.    Main complaints today are ulcerations near the head of his penis and a small decubitus ulcer. He saw PCP, RPR and HIV negative.  Concern for HSV, started on Valtrex.  He tells me that he had an "STD" in the past but does not remember what specific STD.  The penile ulcers have been causing severe pain.  MAP has been elevated with pain.  Otherwise, he is stable.  Able to transfer from bed to wheelchair.  Has a few PI events with HD, otherwise stable LVAD parameters.   Reports taking Coumadin as  prescribed and adherence to anticoagulation based dietary restrictions. Denies BRBPR or melena. No dark urine or hematuria.   Labs (11/18): hgb 10.4 => 8.7, LDH 213 Labs (08/04/17): hgb 9 Labs (1/19): HgbA1c 6.1 Labs (2/19): hgb 13.2 Labs (3/19): hgb 11.1 Labs (7/19): hgb 9.6 Labs (10/19: hgb 11.8 Labs (12/19): LDH 427 => 237, hgb 7.7 Labs (11/20): hgb 8.1 Labs (1/21): HIV negative, RPR negative  PMH: 1. Chronic systolic CHF: Ischemic cardiomyopathy. St Jude ICD.  - Echo (11/17) with EF 25-30%, moderate diastolic dysfunction, mild MR, mildly dilated RV with moderately decreased systolic function, peak RV-RA gradient 48 mmHg.  -  Echo (3/18) with EF 25-30%, wall motion abnormalities, normal RV size and systolic function.  - Admission 3/18 with cardiogenic shock and AKI, required milrinone gtt.  - Echo (8/18) with EF 20-25%, mild LVH, moderate LV dilation, mild MR.  - CPX (8/18) with peak VO2 12.4, VE/VCO2 slope 35, RER 1.05 => severe HR limitation.  - Heartmate 3 LVAD 10/18.  - Pump thrombosis in setting of HM3 outflow graft kink.  Pump exchange 6/19 for HM3.  2. CAD: Anterior MI 2005, had bifurcation stenting LAD/diagonal. Repeat cath 2010 with patent stents.  - NSTEMI 3/18: LHC with 99% ulcerated lesion proximal RCA, 95% mid LAD => PCI with DES to proximal RCA and mid LAD.  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  - Lower extremity venous dopplers (8/18): chronic DVT - V/Q scan (8/18): Normal, no PE.  4. Crohns disease: History of colectomy.  5. GERD 6. Diabetic gastroparesis: Admission 11/18 with abdominal pain, nausea/vomiting.  - [facility] workup 2019: Gastric emptying study near normal.  Negative breath test (no evidence for small bowel bacterial overgrowth).  Electrogastrogram showed poor gastric accomodation/capacity, "bradygastria."  7. HTN 8. Hyperlipidemia 9. Type II diabetes  10. ESRD: Post-op LVAD surgery.  He has had failed AV fistula and AV graft, now dialyzing via catheter . 11.  Carotid dopplers (8/18): mild bilateral stenosis.  12. PFTs (8/18): Mild restriction (no IDL on CT chest).  13. Lung nodules: CT chest 8/18 with small bilateral nodules.  14. PAD: ABIs with moderate bilateral disease in 8/18.  - Peripheral angiogram 03/18/19 showed 80% right SFA stenosis and occluded right PT. He had stenting of SFA and PTCA of PT with restoration of flow. However, he still needed amputation right 4th toe on 03/21/19 with intractable pain.  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  - Peripheral angiogram left leg: DES to left SFA, only 1 vessel runoff via severely diseased peroneal artery, unable to intervene. 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  - Left AKA 11/20.  15. Atrial flutter: In 11/18 associated with nausea/vomiting from gastroparesis.   Current Outpatient Medications  Medication Sig Dispense Refill  . aspirin EC 81 MG tablet Take 1 tablet (81 mg total) by mouth daily. 90 tablet 3  . atorvastatin (LIPITOR) 40 MG tablet Take 1 tablet (40 mg total) by mouth daily at 6 PM. 30 tablet 6  . calcitRIOL (ROCALTROL) 0.5 MCG capsule Take 1 capsule (0.5 mcg total) by mouth every Monday, Wednesday, and Friday. 15 capsule 5  . citalopram (CELEXA) 20 MG tablet Take 1 tablet (20 mg total) by mouth daily. 30 tablet 6  . dronabinol (MARINOL) 2.5 MG capsule Take 1 capsule (2.5 mg total) by mouth 2 (two) times daily before lunch and supper. 60 capsule 5  . erythromycin (ERYTHROCIN) 250 MG tablet Take 250 mg by mouth 3 (three) times daily.    [name] gabapentin (NEURONTIN) 300 MG capsule Take 1 capsule (300 mg total) by mouth 2 (two) times daily. 60 capsule 2  . insulin glargine (LANTUS) 100 UNIT/ML injection Inject 0.1 mLs (10 Units total) into the skin at bedtime. 10 mL 3  . LORazepam (ATIVAN) 0.5 MG tablet Take 1 tablet (0.5 mg total) by mouth every 12 (twelve) hours as needed (nausea). 60 tablet 2  . metoCLOPramide (REGLAN) 10 MG tablet Take 1 tablet (10 mg total) by mouth 3 (three) times daily. (Patient  taking differently: Take 10 mg by mouth 3 (three) times daily with meals. ) 90 tablet 6  . midodrine (PROAMATINE) 5 MG tablet Take 1  tablet (5 mg total) by mouth daily. Take 1 tablet before dialysis treatment (Patient taking differently: Take 5 mg by mouth every Monday, Wednesday, and Friday. Take 1 tablet before dialysis treatment) 30 tablet 6  . multivitamin (RENA-VIT) TABS tablet Take 1 tablet by mouth daily.    . pantoprazole (PROTONIX) 40 MG tablet Take 1 tablet (40 mg total) by mouth 2 (two) times daily before lunch and supper. 60 tablet 11  . promethazine (PHENERGAN) 12.5 MG tablet Take 1 tablet (12.5 mg total) by mouth every 6 (six) hours as needed for nausea or vomiting. 30 tablet 3  . sevelamer carbonate (RENVELA) 0.8 g PACK packet Take 0.8 g by mouth 3 (three) times daily with meals. 270 each 1  . silver sulfADIAZINE (SILVADENE) 1 % cream Apply 1 application topically daily. 50 g 0  . traMADol (ULTRAM) 50 MG tablet Take 1 tablet (50 mg total) by mouth every 6 (six) hours as needed. 20 tablet 0  . valACYclovir (VALTREX) 1000 MG tablet Take 1 tablet (1,000 mg total) by mouth 2 (two) times daily for 10 days. 20 tablet 0  . warfarin (COUMADIN) 5 MG tablet Take 1 tablet (5 mg total) by mouth daily. 30 tablet 11  . collagenase (SANTYL) ointment Apply 1 application topically daily. (Patient not taking: Reported on 09/23/2019) 15 g 0  . doxycycline (VIBRA-TABS) 100 MG tablet Take 1 tablet (100 mg total) by mouth 2 (two) times daily for 14 days. 28 tablet 0  . oxyCODONE (ROXICODONE) 5 MG immediate release tablet Take 1 tablet (5 mg total) by mouth 2 (two) times daily as needed for severe pain. Take 1-2 tablets every 4-6 hours for severe pain. (Patient not taking: Reported on 09/23/2019) 30 tablet 0   No current facility-administered medications for this encounter.    Metformin and related   Review of systems complete and found to be negative unless listed in HPI.    BP (!) 121/97 Comment: map  107  SpO2 100%  MAP >100  VAD interrogation & Equipment Management: See LVAD nurse's note above.   General: Well appearing this am. NAD.  HEENT: Normal. Neck: Supple, JVP 7-8 cm. Carotids OK.  Cardiac:  Mechanical heart sounds with LVAD hum present.  Lungs:  CTAB, normal effort.  Abdomen:  NT, ND, no HSM. No bruits or masses. +BS  LVAD exit site: Well-healed and incorporated. Dressing dry and intact. No erythema or drainage. Stabilization device present and accurately applied. Driveline dressing changed daily per sterile technique. Extremities:  S/p bilateral AKA. Skin: Ulcerations near tip of penis, small decubitus ulcer.  Neuro:  Alert & oriented x 3. Cranial nerves grossly intact. Moves all 4 extremities w/o difficulty. Affect pleasant     ASSESSMENT AND PLAN: 1. PAD/ischemic feet:  He is now s/p bilateral AKAs.  Coping reasonably well, family able to help with transfers.  2. Chronic systolic CHF: Ischemic cardiomyopathy. St Jude ICD. NSTEMI in March 2018 with DES to LAD and RCA, complicated by cardiogenic shock, low output requiring milrinone. Echo 8/18 with EF 20-25%, moderate MR. CPX (8/18) with severe functional impairment due to HF. He is s/p HM3 LVAD placement. He had pump thrombosis 6/19 due to [facility] outflow graft kinking. He had pump exchange for another HM3.   - Volume managed at dialysis, MWF.   - Continue ASA 81and warfarin with INR goal 2-2.5.    - He had been referred to [facility] for evaluation for heart/kidney transplant, but he is not candidate for transplantnowgiven extensive  PAD. 3. ESRD: HD on M-W-F with midodrine pre-HD.  4.CAD: NSTEMI in 3/18. LHC with 99% ulcerated lesion proximal RCA with left to right collaterals, 95% mid LAD stenosis after mid LAD stent. s/p PCI to RCA and LAD on 11/25/16.No chest pain.  - Continue statin and ASA. 5. h/o DVTs: Factor V Leiden heterozygote. -Anticoagulated. 6. Diabetic gastroparesis: He has seen gastroparesis  specialist at [facility]. Surprisingly, gastric emptying study was normal. However, electrogastrogram showed poor gastric accomodation/capacity. Therefore, small frequent meals recommended. - Continue reglan 10 mg tid/ac and Marinol.  - Continue Protonix bid.  - He will continue erythromycinindefinitely.  - Limit narcotic use as much as possible.  7. HTN:MAP higher with pain.  8. Penile lesion: ?HSV.  RPR and HIV negative.  He is on Valtrex.  - Will add doxycycline 100 mg bid x 2 wks to cover bacterial infection. 9. Decubitus ulcer: Small, follow carefully.      [name] 09/23/2019

## 2019-09-23 NOTE — Patient Instructions (Addendum)
1. Start Doxycyline 141m twice a day for 14 days for penile infection 2. You may take Percocet 5-325 1 tablet every 6 hours as needed for penile pain 3. Place allevyn pink foam on ulcer on right buttcheek. Change this every few days or as needed when soiled or wet. Make sure you are changing positions and moving frequently to prevent further breakdown 4. [facility] for follow up

## 2019-09-24 LAB — POCT INR: INR: 2.3 (ref 2.0–3.0)

## 2019-09-25 ENCOUNTER — Ambulatory Visit (HOSPITAL_COMMUNITY): Payer: Self-pay | Admitting: Pharmacist

## 2019-09-25 LAB — SPECIMEN STATUS REPORT

## 2019-09-25 NOTE — Progress Notes (Signed)
LVAD INR 

## 2019-09-28 LAB — HERPES SIMPLEX VIRUS CULTURE

## 2019-10-01 ENCOUNTER — Telehealth (HOSPITAL_COMMUNITY): Payer: Self-pay | Admitting: *Deleted

## 2019-10-01 ENCOUNTER — Ambulatory Visit (HOSPITAL_COMMUNITY): Payer: Self-pay | Admitting: Pharmacist

## 2019-10-01 LAB — POCT INR: INR: 2.7 (ref 2.0–3.0)

## 2019-10-01 NOTE — Telephone Encounter (Signed)
Pt called VAD pager to report he completed medication from PCP (Valtrex) and is currently taking Doxy prescribed by Dr. Aundra Dubin and reports no improvement in penal ulcer.   Called Infectious Disease per Dr. Aundra Dubin asking for referral and spoke with [name], NP. She advised pt to update her Nephrology team and she will arrange for appt for [name] in their office.  Called [name] and asked him to inform Nephrology team about his condition and that ID will reach out to him with appt. He verbalized understanding of same.  Zada Girt RN, [place] Coordinator [phone]

## 2019-10-01 NOTE — Progress Notes (Signed)
LVAD INR 

## 2019-10-03 ENCOUNTER — Other Ambulatory Visit (HOSPITAL_COMMUNITY): Payer: Self-pay | Admitting: Cardiology

## 2019-10-03 DIAGNOSIS — N485 Ulcer of penis: Secondary | ICD-10-CM

## 2019-10-03 MED ORDER — OXYCODONE-ACETAMINOPHEN 5-325 MG PO TABS: 1.0000 | ORAL_TABLET | Freq: Four times a day (QID) | ORAL | 0 refills | Status: DC | PRN

## 2019-10-08 ENCOUNTER — Ambulatory Visit (HOSPITAL_COMMUNITY): Payer: Self-pay | Admitting: Pharmacist

## 2019-10-08 LAB — POCT INR: INR: 2.3 (ref 2.0–3.0)

## 2019-10-08 NOTE — Progress Notes (Signed)
LVAD INR 

## 2019-10-11 ENCOUNTER — Other Ambulatory Visit (HOSPITAL_COMMUNITY): Payer: Self-pay | Admitting: Cardiology

## 2019-10-11 DIAGNOSIS — Z95811 Presence of heart assist device: Secondary | ICD-10-CM

## 2019-10-11 DIAGNOSIS — L8990 Pressure ulcer of unspecified site, unspecified stage: Secondary | ICD-10-CM

## 2019-10-14 ENCOUNTER — Telehealth (HOSPITAL_COMMUNITY): Payer: Self-pay

## 2019-10-14 NOTE — Telephone Encounter (Signed)
HH orders signed and faxed back to [facility]

## 2019-10-15 ENCOUNTER — Other Ambulatory Visit (HOSPITAL_COMMUNITY): Payer: Self-pay | Admitting: *Deleted

## 2019-10-15 ENCOUNTER — Encounter (HOSPITAL_COMMUNITY): Payer: BC Managed Care – PPO

## 2019-10-15 ENCOUNTER — Encounter: Payer: Self-pay | Admitting: Internal Medicine

## 2019-10-15 ENCOUNTER — Ambulatory Visit (HOSPITAL_COMMUNITY): Payer: Self-pay | Admitting: Pharmacist

## 2019-10-15 ENCOUNTER — Other Ambulatory Visit: Payer: Self-pay

## 2019-10-15 ENCOUNTER — Ambulatory Visit (INDEPENDENT_AMBULATORY_CARE_PROVIDER_SITE_OTHER): Payer: BC Managed Care – PPO | Admitting: Internal Medicine

## 2019-10-15 VITALS — Temp 98.6°F | Wt 160.9 lb

## 2019-10-15 DIAGNOSIS — N485 Ulcer of penis: Secondary | ICD-10-CM

## 2019-10-15 DIAGNOSIS — Z95811 Presence of heart assist device: Secondary | ICD-10-CM

## 2019-10-15 DIAGNOSIS — L8990 Pressure ulcer of unspecified site, unspecified stage: Secondary | ICD-10-CM

## 2019-10-15 LAB — POCT INR: INR: 2.2 (ref 2.0–3.0)

## 2019-10-15 MED ORDER — SANTYL 250 UNIT/GM EX OINT: TOPICAL_OINTMENT | Freq: Every day | CUTANEOUS | 2 refills | Status: DC

## 2019-10-15 MED ORDER — AMOXICILLIN-POT CLAVULANATE 875-125 MG PO TABS: 1.0000 | ORAL_TABLET | Freq: Two times a day (BID) | ORAL | 0 refills | Status: DC

## 2019-10-15 MED ORDER — ISAVUCONAZONIUM SULFATE 186 MG PO CAPS: 372.0000 mg | ORAL_CAPSULE | Freq: Every day | ORAL | 0 refills | Status: DC

## 2019-10-15 NOTE — Progress Notes (Signed)
LVAD INR 

## 2019-10-15 NOTE — Progress Notes (Signed)
[facility]      Reason for Consult: penile ulcers    Referring Physician: Dr. Tarry Kos    Patient ID: [name], male    DOB: 11/03/1965, 54 y.o.   MRN: [id number]  HPI:   Here for evaluation of new penile ulcers. He noted ulcers in January and were painful and evaluation was unrevealing, including HSV culture, RPR, HIV.  He denies sexual activity.  He was given valtrex at that time.  He then saw cardiology who gave him doxycycline, but no significant relief.  His nephrologist suggested topical Lamisil but has had no improvement.  No associated fever.  He continues to have significant pain.     Past Medical History:  Diagnosis Date  . Angina   . ASCVD (arteriosclerotic cardiovascular disease)    , Anterior infarction 2005, LAD diagonal bifurcation intervention 03/2004  . Automatic implantable cardiac defibrillator -[employer]       . Benign neoplasm of colon   . CHF (congestive heart failure) ([place])   . Chronic systolic heart failure ([place])   . Coronary artery disease     Widely patent previously placed stents in the left anterior   . Crohn's disease ([place])   . Deep venous thrombosis ([facility])    Recurrent-on Coumadin  . Dialysis patient ([place])   . Dyspnea   . Gastroparesis   . GERD (gastroesophageal reflux disease)   . High cholesterol   . Hyperlipidemia   . Hypersomnolent    Previous diagnosis of narcolepsy  . Hypertension, essential   . Ischemic cardiomyopathy    Ejection fraction 15-20% catheterization 2010  . Type II or unspecified type diabetes mellitus without mention of complication, not stated as uncontrolled   . Unspecified gastritis and gastroduodenitis without mention of hemorrhage     Prior to Admission medications   Medication Sig Start Date End Date Taking? Authorizing Provider  aspirin EC 81 MG tablet Take 1 tablet (81 mg total) by mouth daily. 08/20/18   Eileen Stanford, PA-C  atorvastatin (LIPITOR) 40 MG tablet Take 1  tablet (40 mg total) by mouth daily at 6 PM. 01/25/18   [name], MD  calcitRIOL (ROCALTROL) 0.5 MCG capsule Take 1 capsule (0.5 mcg total) by mouth every Monday, Wednesday, and Friday. 06/06/18   Georgiana Shore, NP  citalopram (CELEXA) 20 MG tablet Take 1 tablet (20 mg total) by mouth daily. 03/19/18   Georgiana Shore, NP  dronabinol (MARINOL) 2.5 MG capsule Take 1 capsule (2.5 mg total) by mouth 2 (two) times daily before lunch and supper. 03/27/18   Larey Dresser, MD  erythromycin (ERYTHROCIN) 250 MG tablet Take 250 mg by mouth 3 (three) times daily.    [provider]  gabapentin (NEURONTIN) 300 MG capsule Take 1 capsule (300 mg total) by mouth 2 (two) times daily. 04/16/19   Larey Dresser, MD  insulin glargine (LANTUS) 100 UNIT/ML injection Inject 0.1 mLs (10 Units total) into the skin at bedtime. 08/09/19   Anderson, Chelsey L, DO  LORazepam (ATIVAN) 0.5 MG tablet Take 1 tablet (0.5 mg total) by mouth every 12 (twelve) hours as needed (nausea). 05/17/19   Larey Dresser, MD  metoCLOPramide (REGLAN) 10 MG tablet Take 1 tablet (10 mg total) by mouth 3 (three) times daily. Patient taking differently: Take 10 mg by mouth 3 (three) times daily with meals.  01/22/18   Georgiana Shore, NP  midodrine (PROAMATINE) 5 MG tablet Take 1  tablet (5 mg total) by mouth daily. Take 1 tablet before dialysis treatment Patient taking differently: Take 5 mg by mouth every Monday, Wednesday, and Friday. Take 1 tablet before dialysis treatment 04/04/19   Clegg, Amy D, NP  multivitamin (RENA-VIT) TABS tablet Take 1 tablet by mouth daily.    [provider]  oxyCODONE (ROXICODONE) 5 MG immediate release tablet Take 1 tablet (5 mg total) by mouth 2 (two) times daily as needed for severe pain. Take 1-2 tablets every 4-6 hours for severe pain. Patient not taking: Reported on 09/23/2019 05/21/19   Larey Dresser, MD  oxyCODONE-acetaminophen (PERCOCET) 5-325 MG tablet Take 1 tablet by mouth  every 6 (six) hours as needed for severe pain. 10/03/19 10/02/20  Lyda Jester M, PA-C  pantoprazole (PROTONIX) 40 MG tablet Take 1 tablet (40 mg total) by mouth 2 (two) times daily before lunch and supper. 05/07/19   Barrett, Evelene Croon, PA-C  promethazine (PHENERGAN) 12.5 MG tablet Take 1 tablet (12.5 mg total) by mouth every 6 (six) hours as needed for nausea or vomiting. 10/23/18   Larey Dresser, MD  SANTYL ointment Please specify directions, refills and quantity 10/11/19   Larey Dresser, MD  sevelamer carbonate (RENVELA) 0.8 g PACK packet Take 0.8 g by mouth 3 (three) times daily with meals. 06/16/19   Bhagat, Crista Luria, PA  silver sulfADIAZINE (SILVADENE) 1 % cream Apply 1 application topically daily. 09/18/19   Mullis, Kiersten P, DO  traMADol (ULTRAM) 50 MG tablet Take 1 tablet (50 mg total) by mouth every 6 (six) hours as needed. 07/30/19 07/29/20  Clegg, Amy D, NP  warfarin (COUMADIN) 5 MG tablet Take 1 tablet (5 mg total) by mouth daily. 07/30/19 07/29/20  Conrad [place], NP    Allergies  Allergen Reactions  . Metformin And Related Diarrhea    Social History   Tobacco Use  . Smoking status: Never Smoker  . Smokeless tobacco: Never Used  Substance Use Topics  . Alcohol use: No  . Drug use: No    Family History  Problem Relation Age of Onset  . Colon polyps Mother   . Diabetes Mother   . Heart attack Father   . Diabetes Father   . Stroke Paternal Grandfather   . Colitis Sister   . Heart disease Brother   . Colitis Sister   . Colon cancer Neg Hx     Review of Systems  Constitutional: negative for fevers and chills Genitourinary: negative for penile discharge All other systems reviewed and are negative    Constitutional: in no apparent distress There were no vitals filed for this visit. EYES: anicteric GU: penile glans with some discharge, ulcers, very sensitive to touch No inguinal LAD Skin: negatives: no rash  Labs: Lab Results  Component Value Date    WBC 10.9 (H) 07/30/2019   HGB 8.1 (L) 07/30/2019   HCT 28.3 (L) 07/30/2019   MCV 91.3 07/30/2019   PLT 313 07/30/2019    Lab Results  Component Value Date   CREATININE 7.98 (H) 07/30/2019   BUN 37 (H) 07/30/2019   NA 134 (L) 07/30/2019   K 4.6 07/30/2019   CL 96 (L) 07/30/2019   CO2 26 07/30/2019    Lab Results  Component Value Date   ALT 14 07/22/2019   AST 18 07/22/2019   ALKPHOS 112 07/22/2019   BILITOT 0.6 07/22/2019   INR 2.3 10/08/2019     Assessment: 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  I most suspect he  had an underlying viral lesion such as HSV (despite negative culture) and developed a superinfection.  I think the main issue now is he is unable to clean it so infection is maintained.  I emphasized he needs to clean the area despite the pain. No culture has grown but may be bacterial or fungal.   Since he is on coumadin, can't use fluconazole but will try for Cresemba and use Augmentin as well.     Plan: 1) clear the area twice a day with soap and water, as tolerated 2) Cresemba and Augmentin rtc 1 week

## 2019-10-18 LAB — WOUND CULTURE
MICRO NUMBER:: 10136239
SPECIMEN QUALITY:: ADEQUATE

## 2019-10-22 ENCOUNTER — Other Ambulatory Visit: Payer: Self-pay | Admitting: Internal Medicine

## 2019-10-22 ENCOUNTER — Ambulatory Visit (HOSPITAL_COMMUNITY): Payer: Self-pay | Admitting: Pharmacist

## 2019-10-22 LAB — POCT INR: INR: 2.6 (ref 2.0–3.0)

## 2019-10-22 NOTE — Progress Notes (Signed)
LVAD INR 

## 2019-10-24 ENCOUNTER — Ambulatory Visit: Payer: BC Managed Care – PPO | Admitting: Internal Medicine

## 2019-10-29 ENCOUNTER — Ambulatory Visit: Payer: BC Managed Care – PPO | Admitting: Internal Medicine

## 2019-10-29 ENCOUNTER — Other Ambulatory Visit (HOSPITAL_COMMUNITY): Payer: Self-pay | Admitting: Cardiology

## 2019-10-29 ENCOUNTER — Telehealth (HOSPITAL_COMMUNITY): Payer: Self-pay | Admitting: *Deleted

## 2019-10-29 ENCOUNTER — Other Ambulatory Visit: Payer: Self-pay | Admitting: *Deleted

## 2019-10-29 DIAGNOSIS — N485 Ulcer of penis: Secondary | ICD-10-CM

## 2019-10-29 LAB — POCT INR: INR: 2.2 (ref 2.0–3.0)

## 2019-10-29 MED ORDER — OXYCODONE-ACETAMINOPHEN 5-325 MG PO TABS: 1.0000 | ORAL_TABLET | Freq: Four times a day (QID) | ORAL | 0 refills | Status: AC | PRN

## 2019-10-29 NOTE — Telephone Encounter (Signed)
Received phone call from patient stating he had an appointment with a urologist regarding his penile ulcer. Per patient urology will not prescribe him pain medication and is referring him to see a wound care specialist for treatment of ulcer. Discussed with Dr Aundra Dubin. Will give Oxycodone 5 mg #10 tablets 0 referral and to get an appointment with the pain clinic. Provided patient with Dr Jodene Nam office phone number [phone] for him to arrange an appointment. Instructed him to call [facility] coordinators if he has any difficulty getting appointment.   [name] RN [place] Coordinator  Office: [phone]  24/7 Pager: [phone]

## 2019-10-31 ENCOUNTER — Ambulatory Visit (HOSPITAL_COMMUNITY): Payer: Self-pay | Admitting: Pharmacist

## 2019-10-31 NOTE — Progress Notes (Signed)
LVAD INR 

## 2019-11-04 ENCOUNTER — Ambulatory Visit: Payer: BC Managed Care – PPO | Admitting: Student in an Organized Health Care Education/Training Program

## 2019-11-05 ENCOUNTER — Ambulatory Visit (HOSPITAL_COMMUNITY): Payer: Self-pay | Admitting: Pharmacist

## 2019-11-05 LAB — POCT INR: INR: 2.7 (ref 2.0–3.0)

## 2019-11-05 NOTE — Progress Notes (Signed)
LVAD INR 

## 2019-11-12 ENCOUNTER — Ambulatory Visit (HOSPITAL_COMMUNITY): Payer: Self-pay | Admitting: Pharmacist

## 2019-11-12 LAB — POCT INR: INR: 3.2 — AB (ref 2.0–3.0)

## 2019-11-12 NOTE — Progress Notes (Signed)
LVAD INR 

## 2019-11-19 ENCOUNTER — Ambulatory Visit (HOSPITAL_COMMUNITY)
Admission: RE | Admit: 2019-11-19 | Discharge: 2019-11-19 | Disposition: A | Discharge status: Home / Self Care | Payer: Medicare Other | Source: Ambulatory Visit | Attending: Cardiology | Admitting: Cardiology

## 2019-11-19 ENCOUNTER — Other Ambulatory Visit: Payer: Self-pay

## 2019-11-19 ENCOUNTER — Ambulatory Visit (HOSPITAL_COMMUNITY): Payer: Self-pay | Admitting: Pharmacist

## 2019-11-19 ENCOUNTER — Ambulatory Visit (INDEPENDENT_AMBULATORY_CARE_PROVIDER_SITE_OTHER): Payer: Medicare Other | Admitting: *Deleted

## 2019-11-19 ENCOUNTER — Encounter (HOSPITAL_COMMUNITY): Payer: Self-pay

## 2019-11-19 VITALS — BP 104/61 | HR 108

## 2019-11-19 DIAGNOSIS — Z79899 Other long term (current) drug therapy: Secondary | ICD-10-CM | POA: Diagnosis not present

## 2019-11-19 DIAGNOSIS — Z7982 Long term (current) use of aspirin: Secondary | ICD-10-CM | POA: Diagnosis not present

## 2019-11-19 DIAGNOSIS — Z9049 Acquired absence of other specified parts of digestive tract: Secondary | ICD-10-CM | POA: Insufficient documentation

## 2019-11-19 DIAGNOSIS — E1143 Type 2 diabetes mellitus with diabetic autonomic (poly)neuropathy: Secondary | ICD-10-CM | POA: Diagnosis not present

## 2019-11-19 DIAGNOSIS — I739 Peripheral vascular disease, unspecified: Secondary | ICD-10-CM

## 2019-11-19 DIAGNOSIS — I255 Ischemic cardiomyopathy: Secondary | ICD-10-CM | POA: Insufficient documentation

## 2019-11-19 DIAGNOSIS — N186 End stage renal disease: Secondary | ICD-10-CM | POA: Insufficient documentation

## 2019-11-19 DIAGNOSIS — G8929 Other chronic pain: Secondary | ICD-10-CM | POA: Insufficient documentation

## 2019-11-19 DIAGNOSIS — Z95810 Presence of automatic (implantable) cardiac defibrillator: Secondary | ICD-10-CM | POA: Diagnosis not present

## 2019-11-19 DIAGNOSIS — Z7901 Long term (current) use of anticoagulants: Secondary | ICD-10-CM | POA: Insufficient documentation

## 2019-11-19 DIAGNOSIS — E1122 Type 2 diabetes mellitus with diabetic chronic kidney disease: Secondary | ICD-10-CM | POA: Insufficient documentation

## 2019-11-19 DIAGNOSIS — D6851 Activated protein C resistance: Secondary | ICD-10-CM | POA: Insufficient documentation

## 2019-11-19 DIAGNOSIS — G8918 Other acute postprocedural pain: Secondary | ICD-10-CM | POA: Insufficient documentation

## 2019-11-19 DIAGNOSIS — Z95811 Presence of heart assist device: Secondary | ICD-10-CM | POA: Diagnosis not present

## 2019-11-19 DIAGNOSIS — I5022 Chronic systolic (congestive) heart failure: Secondary | ICD-10-CM | POA: Diagnosis present

## 2019-11-19 DIAGNOSIS — K5090 Crohn's disease, unspecified, without complications: Secondary | ICD-10-CM | POA: Diagnosis not present

## 2019-11-19 DIAGNOSIS — I2510 Atherosclerotic heart disease of native coronary artery without angina pectoris: Secondary | ICD-10-CM | POA: Diagnosis not present

## 2019-11-19 DIAGNOSIS — E785 Hyperlipidemia, unspecified: Secondary | ICD-10-CM | POA: Diagnosis not present

## 2019-11-19 DIAGNOSIS — K219 Gastro-esophageal reflux disease without esophagitis: Secondary | ICD-10-CM | POA: Diagnosis not present

## 2019-11-19 DIAGNOSIS — I252 Old myocardial infarction: Secondary | ICD-10-CM | POA: Insufficient documentation

## 2019-11-19 DIAGNOSIS — Z955 Presence of coronary angioplasty implant and graft: Secondary | ICD-10-CM | POA: Diagnosis not present

## 2019-11-19 DIAGNOSIS — I132 Hypertensive heart and chronic kidney disease with heart failure and with stage 5 chronic kidney disease, or end stage renal disease: Secondary | ICD-10-CM | POA: Insufficient documentation

## 2019-11-19 DIAGNOSIS — I4892 Unspecified atrial flutter: Secondary | ICD-10-CM | POA: Diagnosis not present

## 2019-11-19 DIAGNOSIS — Z992 Dependence on renal dialysis: Secondary | ICD-10-CM | POA: Insufficient documentation

## 2019-11-19 DIAGNOSIS — Z4801 Encounter for change or removal of surgical wound dressing: Secondary | ICD-10-CM | POA: Insufficient documentation

## 2019-11-19 DIAGNOSIS — Z86718 Personal history of other venous thrombosis and embolism: Secondary | ICD-10-CM | POA: Insufficient documentation

## 2019-11-19 DIAGNOSIS — Z794 Long term (current) use of insulin: Secondary | ICD-10-CM | POA: Diagnosis not present

## 2019-11-19 DIAGNOSIS — K3184 Gastroparesis: Secondary | ICD-10-CM | POA: Diagnosis not present

## 2019-11-19 LAB — POCT INR: INR: 2.7 (ref 2.0–3.0)

## 2019-11-19 MED ORDER — ATORVASTATIN CALCIUM 40 MG PO TABS: 40.0000 mg | ORAL_TABLET | Freq: Every day | ORAL | 3 refills | Status: DC

## 2019-11-19 NOTE — Progress Notes (Addendum)
Patient presents for 2 month follow up visit follow up in [facility] today with his son. Reports no problems with VAD equipment or concerns with drive line.   Pt saw Dr. Novella Olive in [place] clinic on 10/15/19 and completed Cresemba and Augmentin antibiotics for penile ulcer. Pt reports he just left wound care center where he is receiving treatment for same. He says he has initial visit with pain clinic this Thursday. He reports to Dr. Aundra Dubin he is still having pain, but has a "few more" oxy left" that will hold him until visit with pain clinic.  He says dialysis if going ok, he has to stop treatment when penile pain becomes too much to bear. He says sessions are lasting @ 3.5 hours long.   Pt has gained noticeable weight, unable to weigh. Dr. Aundra Dubin counseled him on importance of activities that include he pushing himself in w/c. Currently son is lifting him from bed to w/c and back. Pt reports he stays in his room unless he needs to use bathroom.   Pt wanted VAD coordinator to look at DL exit site for "tenderness". See note below.   Labs obtained from dialysis center from 11/13/19 including CBC, CMP, TSH. Dr. Aundra Dubin reviewed.    Vital Signs:  Temp: 98.9 Doppler Pressure  106 Automatc BP: 104/61 (71) HR:  108 SPO2: UTO  Weight: UTO lb w/o eqt Last weight: 200 lb   VAD Indication: Destination therapy- Pt is no longer candidate for heart/kidney transplant due to PAD per [facility].   VAD interrogation & Equipment Management: Speed: 5500 Flow: 4.2 Power: 4.5w    PI: 3.2  Alarms:none Events: 5-20 daily  Fixed speed 5500 Low speed limit: 5200  Primary Controller:  Replace back up battery in 10 months. Back up controller:   Replace back up battery in 0 months; replaced with back up battery [id number]  Annual Equipment Maintenance on UBC/PM was performed on 06/2017.   I reviewed the LVAD parameters from today and compared the results to the patient's prior recorded data. LVAD interrogation  was NEGATIVE for significant power changes, NEGATIVE for clinical alarms and STABLE for PI events/speed drops. No programming changes were made and pump is functioning within specified parameters. Pt is performing daily controller and system monitor self tests along with completing weekly and monthly maintenance for LVAD equipment.  LVAD equipment check completed and is in good working order. Back-up equipment present.  Exit Site Care: Drive line is being maintained weekly by daughter. CDI today. Anchor secure, but drive line not secured in same. Existing VAD dressing removed and site care performed using sterile technique. Drive line exit site cleaned with Chlora prep applicators x 2, rinsed with saline, allowed to dry, and Sorbaview dressing with bio patch re-applied. Exit site healed and incorporated, the velour is fully implanted at exit site. No redness, tenderness, drainage, foul odor or rash noted; small dried exudate around DL cleaned off. Drive line anchor re-applied. Pt denies fever or chills. Stressed importance of attaching DL to anchor to prevent tugging, trauma to site. Pt verbalized understanding of same. Provided patient with 11 weekly dressing kits for home use.    Significant Events on VAD Support: -10/2016>> poss drive line infection, CT ABD neg, ID consult-doxy -11/09/16>> admit for poss drive line infection, IV abx -03/24/17>> doxy for poss drive line infection -05/04/17>> drive line debridement with wound-vac -06/2017>> drive line +proteus, IV abx, Bactrim x14 days  Device:St jude Dual Therapies: on 200 bpm Last check: 01/01/18 -  2 hrs Afl/Afib; EMI 04/02/18  BP & Labs:  Doppler BP 106 - is reflecting modified systolic.  Hgb not done today; difficult venous access. Result 13.9 from dialysis labs 11/13/19 - No S/S of bleeding. Specifically denies melena/BRBPR or nosebleeds.  LDH not done today, difficult venous access. Denies tea-colored urine. No power elevations noted on  interrogation.   Patient Instructions:  1. No medication changes. 2. Return to [place] clinic in 3 months with 2 year Intermacs. Will need to bring home equipment to visit for annual maintenance.   Zada Girt RN VAD Coordinator  Office: [phone]  24/7 Pager: [phone]      Cardiology: Dr. Aundra Dubin  HPI: 54 y.o. with history of CAD s/p anterior MI in 2010 and NSTEMI in 3/18 with DES to pRCA and mLAD and ischemic cardiomyopathy now s/p Heartmate 3 LVAD and ESRD presents for followup of CHF/LVAD.  Patient developed low output heart failure.  He was not deemed to be a good transplant candidate.  He was admitted in 10/18 and Heartmate 3 LVAD was placed.  He had baseline CKD stage 3 likely from diabetic nephropathy and CHF.  He developed peri-op AKI and ended up on dialysis.  No renal recovery to date.  St Jude ICD has been turned back on and ramp echo was done prior to discharge in 10/18, speed increased to 5600 rpm.   He was admitted in 11/18 with nausea/vomiting and abdominal pain.  This appears to have been due to diabetic gastroparesis (extensive GI evaluation). He had atrial flutter this admission that was converted to NSR with amiodarone.  He improved considerably with scheduled Reglan and erythromycin. He was re-admitted later in 11/18 with the same symptoms, suspect diabetic gastroparesis and was again treated with erythromycin.  He was not able to start domperidone due to prolonged QT interval. He had nausea again about a week ago but was able to manage at home with Phenergan suppositories.   Admitted 1/8 -> 09/16/17 for uncontrolled vomiting in setting of diabetic gastroparesis. Improved with phenergan suppositories and given erythromycin TID for 7 days post-discharge.    He was admitted in 2/19 for AV graft placement.  While in the hospital, we turned down his speed.  Since then, he has had considerably fewer PI events on HD days.    He was admitted in 4/19 with nausea/vomiting  again, probably gastroparesis flare.   He was admitted in 5/19 with nausea/vomiting, likely gastroparesis flare, treated with erythromycin course which he is finishing.   He has been evaluated at [facility] by Dr. Derrill Kay (gastroparesis specialist).   Gastric emptying study, surprisingly, was near normal. However, electrogastrogram showed poor gastric accomodation/capacity, "bradygastria."  Frequent small meals were recommended. Also, early am nausea was thought to be possible GERD.    Admitted 6/26 - 03/19/18 for emergent VAD exchange 03/01/18 in the setting of HM3 outflow graft kink. He required CVVHD post op, but was transtioned back to [facility] without problems. Course complicated by leukocytosis and fever. CT abd/chest negative for infection and blood cultures were negative. Post op pain well controlled withraretramadol. Able to tolerate diet. Evaluated by PT/OT and HH PT recommended for discharge. Ramp echo completed prior to discharge with speed optimization. Resumed81 mg ASA + coumadin with INR goal 2-3.  He was admitted 03/23/18 with nausea/gastroparesis symptoms and marked HTN at outpatient HD.  Erythromycin was restarted and gastroparesis symptoms were controlled with fall in BP and discharge on 03/25/18.   He was admitted in 9/19 for LUE  AV fistula placement.  He was readmitted in 10/19 with his typical gastroparesis symptoms, unable to take po. The fistula later failed and was replaced with a graft.  However, the graft was nonfunctional and developed a hematoma at the surgical site.    He was admitted in 12/19 with nausea/vomiting from HD. Also with LDH increased to 427 in setting of extensive hematoma around his left arm AV graft.  He has had tingling in his left hand and left grip is weak.    In 7/20, he was admitted with ischemic 4th toe on right foot.  Peripheral angiogram 03/18/19 showed 80% right SFA stenosis and occluded right PT. He had stenting of SFA and PTCA of PT with restoration of  flow. However, he still needed amputation right 4th toe on 7/16 with intractable pain.  He was readmitted with pain in his right foot, nausea, and vomiting. There was concern for ongoing right foot ischemia with all toes and a portion of the foot now dusky.  He was treated with IV vancomycin for possible osteomyelitis/infection, but thought that more likely ischemia could explain all the findings.  He was discharged home to followup with vascular surgery.   He was re-admitted in 8/20 with ongoing right foot pain and ended up having transmetatarsal amputation of the right foot.  Left leg angiogram was done with DES to left SFA, only 1 vessel runoff via severely diseased peroneal artery, unable to intervene.   He was readmitted in 9/20 with ongoing right foot/lower leg pain and had right AKA.  He was discharged to [facility].    He was then admitted with gangrene in the remnant of his left foot in 11/20.  He had left AKA.    Recently, he developed ulceration on the head of his penis. He saw PCP, RPR and HIV negative.  Concern for HSV, started on Valtrex.  He saw ID, though possible HSV (though tested negative) with bacterial superinfection.  He was treated with Augmentin, now completed.  The penile ulcers have been causing severe pain, especially at the end of HD, and he has been using narcotics for control.  No dyspnea.  Not very active but able to transfer into wheelchair and wheel his wheelchair.   Reports taking Coumadin as prescribed and adherence to anticoagulation based dietary restrictions. Denies BRBPR or melena. No dark urine or hematuria.   Labs (11/18): hgb 10.4 => 8.7, LDH 213 Labs (08/04/17): hgb 9 Labs (1/19): HgbA1c 6.1 Labs (2/19): hgb 13.2 Labs (3/19): hgb 11.1 Labs (7/19): hgb 9.6 Labs (10/19: hgb 11.8 Labs (12/19): LDH 427 => 237, hgb 7.7 Labs (11/20): hgb 8.1 Labs (1/21): HIV negative, RPR negative Labs (3/21): hgb 8.8  PMH: 1. Chronic systolic CHF: Ischemic  cardiomyopathy. St Jude ICD.  - Echo (11/17) with EF 25-30%, moderate diastolic dysfunction, mild MR, mildly dilated RV with moderately decreased systolic function, peak RV-RA gradient 48 mmHg.  - Echo (3/18) with EF 25-30%, wall motion abnormalities, normal RV size and systolic function.  - Admission 3/18 with cardiogenic shock and AKI, required milrinone gtt.  - Echo (8/18) with EF 20-25%, mild LVH, moderate LV dilation, mild MR.  - CPX (8/18) with peak VO2 12.4, VE/VCO2 slope 35, RER 1.05 => severe HR limitation.  - Heartmate 3 LVAD 10/18.  - Pump thrombosis in setting of HM3 outflow graft kink.  Pump exchange 6/19 for HM3.  2. CAD: Anterior MI 2005, had bifurcation stenting LAD/diagonal. Repeat cath 2010 with patent stents.  - NSTEMI  3/18: LHC with 99% ulcerated lesion proximal RCA, 95% mid LAD => PCI with DES to proximal RCA and mid LAD.  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  - Lower extremity venous dopplers (8/18): chronic DVT - V/Q scan (8/18): Normal, no PE.  4. Crohns disease: History of colectomy.  5. GERD 6. Diabetic gastroparesis: Admission 11/18 with abdominal pain, nausea/vomiting.  - [facility] workup 2019: Gastric emptying study near normal.  Negative breath test (no evidence for small bowel bacterial overgrowth).  Electrogastrogram showed poor gastric accomodation/capacity, "bradygastria."  7. HTN 8. Hyperlipidemia 9. Type II diabetes  10. ESRD: Post-op LVAD surgery.  He has had failed AV fistula and AV graft, now dialyzing via catheter . 11. Carotid dopplers (8/18): mild bilateral stenosis.  12. PFTs (8/18): Mild restriction (no IDL on CT chest).  13. Lung nodules: CT chest 8/18 with small bilateral nodules.  14. PAD: ABIs with moderate bilateral disease in 8/18.  - Peripheral angiogram 03/18/19 showed 80% right SFA stenosis and occluded right PT. He had stenting of SFA and PTCA of PT with restoration of flow. However, he still needed amputation right 4th toe on 03/21/19 with  intractable pain.  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  - Peripheral angiogram left leg: DES to left SFA, only 1 vessel runoff via severely diseased peroneal artery, unable to intervene. 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  - Left AKA 11/20.  15. Atrial flutter: In 11/18 associated with nausea/vomiting from gastroparesis.   Current Outpatient Medications  Medication Sig Dispense Refill  . aspirin EC 81 MG tablet Take 1 tablet (81 mg total) by mouth daily. 90 tablet 3  . calcitRIOL (ROCALTROL) 0.5 MCG capsule Take 1 capsule (0.5 mcg total) by mouth every Monday, Wednesday, and Friday. 15 capsule 5  . citalopram (CELEXA) 20 MG tablet Take 1 tablet (20 mg total) by mouth daily. 30 tablet 6  . collagenase (SANTYL) ointment Apply topically daily. Apply to buttocks wound daily 15 g 2  . dronabinol (MARINOL) 2.5 MG capsule Take 1 capsule (2.5 mg total) by mouth 2 (two) times daily before lunch and supper. 60 capsule 5  . gabapentin (NEURONTIN) 300 MG capsule Take 1 capsule (300 mg total) by mouth 2 (two) times daily. 60 capsule 2  . insulin glargine (LANTUS) 100 UNIT/ML injection Inject 0.1 mLs (10 Units total) into the skin at bedtime. 10 mL 3  . Isavuconazonium Sulfate 186 MG CAPS Take 2 capsules (372 mg total) by mouth daily. 30 capsule 0  . LORazepam (ATIVAN) 0.5 MG tablet Take 1 tablet (0.5 mg total) by mouth every 12 (twelve) hours as needed (nausea). 60 tablet 2  . metoCLOPramide (REGLAN) 10 MG tablet Take 1 tablet (10 mg total) by mouth 3 (three) times daily. (Patient taking differently: Take 10 mg by mouth 3 (three) times daily with meals. ) 90 tablet 6  . midodrine (PROAMATINE) 5 MG tablet Take 1 tablet (5 mg total) by mouth daily. Take 1 tablet before dialysis treatment (Patient taking differently: Take 5 mg by mouth every Monday, Wednesday, and Friday. Take 1 tablet before dialysis treatment) 30 tablet 6  . multivitamin (RENA-VIT) TABS tablet Take 1 tablet by mouth daily.    [name] oxyCODONE (ROXICODONE) 5  MG immediate release tablet Take 1 tablet (5 mg total) by mouth 2 (two) times daily as needed for severe pain. Take 1-2 tablets every 4-6 hours for severe pain. 30 tablet 0  . oxyCODONE-acetaminophen (PERCOCET) 5-325 MG tablet Take 1 tablet by mouth every 6 (six) hours as  needed for severe pain. 10 tablet 0  . pantoprazole (PROTONIX) 40 MG tablet Take 1 tablet (40 mg total) by mouth 2 (two) times daily before lunch and supper. 60 tablet 11  . promethazine (PHENERGAN) 12.5 MG tablet Take 1 tablet (12.5 mg total) by mouth every 6 (six) hours as needed for nausea or vomiting. 30 tablet 3  . sevelamer carbonate (RENVELA) 0.8 g PACK packet Take 0.8 g by mouth 3 (three) times daily with meals. 270 each 1  . silver sulfADIAZINE (SILVADENE) 1 % cream Apply 1 application topically daily. 50 g 0  . traMADol (ULTRAM) 50 MG tablet Take 1 tablet (50 mg total) by mouth every 6 (six) hours as needed. 20 tablet 0  . warfarin (COUMADIN) 5 MG tablet Take 1 tablet (5 mg total) by mouth daily. 30 tablet 11  . atorvastatin (LIPITOR) 40 MG tablet Take 1 tablet (40 mg total) by mouth daily at 6 PM. 90 tablet 3   No current facility-administered medications for this encounter.    Metformin and related   Review of systems complete and found to be negative unless listed in HPI.    BP 104/61 Comment: MAP 71  Pulse (!) 108   VAD interrogation & Equipment Management: See LVAD nurse's note above.   General: Well appearing this am. NAD.  HEENT: Normal. Neck: Supple, JVP 7-8 cm. Carotids OK.  Cardiac:  Mechanical heart sounds with LVAD hum present.  Lungs:  CTAB, normal effort.  Abdomen:  NT, ND, no HSM. No bruits or masses. +BS  LVAD exit site: Well-healed and incorporated. Dressing dry and intact. No erythema or drainage. Stabilization device present and accurately applied. Driveline dressing changed daily per sterile technique. Extremities:  S/p bilateral AKA.  Neuro:  Alert & oriented x 3. Cranial nerves grossly  intact. Moves all 4 extremities w/o difficulty. Affect pleasant     ASSESSMENT AND PLAN: 1. PAD/ischemic feet:  He is now s/p bilateral AKAs.  Coping reasonably well, family able to help with transfers.  2. Chronic systolic CHF: Ischemic cardiomyopathy. St Jude ICD. NSTEMI in March 2018 with DES to LAD and RCA, complicated by cardiogenic shock, low output requiring milrinone. Echo 8/18 with EF 20-25%, moderate MR. CPX (8/18) with severe functional impairment due to HF. He is s/p HM3 LVAD placement. He had pump thrombosis 6/19 due to [facility] outflow graft kinking. He had pump exchange for another HM3.   - Volume managed at dialysis, MWF.   - Continue ASA 81and warfarin with INR goal 2-2.5.    - He had been referred to [facility] for evaluation for heart/kidney transplant, but he is not candidate for transplantnowgiven extensive PAD. 3. ESRD: HD on M-W-F with midodrine pre-HD.  4.CAD: NSTEMI in 3/18. LHC with 99% ulcerated lesion proximal RCA with left to right collaterals, 95% mid LAD stenosis after mid LAD stent. s/p PCI to RCA and LAD on 11/25/16.No chest pain.  - Continue statin and ASA. 5. h/o DVTs: Factor V Leiden heterozygote. -Anticoagulated. 6. Diabetic gastroparesis: He has seen gastroparesis specialist at [facility]. Surprisingly, gastric emptying study was normal. However, electrogastrogram showed poor gastric accomodation/capacity. Therefore, small frequent meals recommended. - Continue reglan 10 mg tid/ac and Marinol.  - Continue Protonix bid.  - He will continue erythromycinindefinitely.  - Limit narcotic use as much as possible.  7. HTN:MAP controlled.  8. 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  RPR and HIV negative.  Seen by ID, treated with Valtrex and Augmentin.  Getting wound care.  9. Chronic pain: He has been referred to a pain clinic.      Rondey Fallen 11/19/2019

## 2019-11-19 NOTE — Progress Notes (Signed)
LVAD INR 

## 2019-11-19 NOTE — Progress Notes (Signed)
CSW met in the clinic with patient and son who is primary caregiver. Patient reports all is going well at home although he continues to struggle with a "penial issue". Patient states the recovery is expected to be about another 6 weeks. Patient continues to go for dialysis with no concerns and states he is hopeful to get his covid vaccine within the next 2 weeks at the dialysis center. Patient denies any other concerns at this time. CSW will continue to follow and be available as needed. Raquel Sarna, [place], [place]

## 2019-11-20 LAB — CUP PACEART REMOTE DEVICE CHECK
Battery Remaining Longevity: 62 mo
Battery Remaining Percentage: 68 %
Battery Voltage: 2.96 V
Brady Statistic AP VP Percent: 1 %
Brady Statistic AP VS Percent: 1 %
Brady Statistic AS VP Percent: 1 %
Brady Statistic AS VS Percent: 98 %
Brady Statistic RA Percent Paced: 1 %
Brady Statistic RV Percent Paced: 1 %
Date Time Interrogation Session: 20210316135243
HighPow Impedance: 47 Ohm
HighPow Impedance: 47 Ohm
Implantable Lead Implant Date: 20100430
Implantable Lead Implant Date: 20100430
Implantable Lead Location: 753859
Implantable Lead Location: 753860
Implantable Lead Model: 7121
Implantable Pulse Generator Implant Date: 20180110
Lead Channel Impedance Value: 330 Ohm
Lead Channel Impedance Value: 610 Ohm
Lead Channel Pacing Threshold Amplitude: 0.75 V
Lead Channel Pacing Threshold Amplitude: 1 V
Lead Channel Pacing Threshold Pulse Width: 0.5 ms
Lead Channel Pacing Threshold Pulse Width: 0.5 ms
Lead Channel Sensing Intrinsic Amplitude: 1.2 mV
Lead Channel Sensing Intrinsic Amplitude: 12 mV
Lead Channel Setting Pacing Amplitude: 2.5 V
Lead Channel Setting Pacing Amplitude: 2.5 V
Lead Channel Setting Pacing Pulse Width: 0.5 ms
Lead Channel Setting Sensing Sensitivity: 0.5 mV
Pulse Gen Serial Number: 7383809

## 2019-11-20 NOTE — Progress Notes (Signed)
ICD Remote  

## 2019-11-26 ENCOUNTER — Ambulatory Visit (HOSPITAL_COMMUNITY): Payer: Self-pay | Admitting: Pharmacist

## 2019-11-26 LAB — POCT INR: INR: 2.6 (ref 2.0–3.0)

## 2019-11-26 NOTE — Progress Notes (Signed)
LVAD INR 

## 2019-12-03 ENCOUNTER — Ambulatory Visit (HOSPITAL_COMMUNITY): Payer: Self-pay | Admitting: Pharmacist

## 2019-12-03 LAB — POCT INR: INR: 2.8 (ref 2.0–3.0)

## 2019-12-03 NOTE — Progress Notes (Signed)
LVAD INR 

## 2019-12-10 ENCOUNTER — Ambulatory Visit (HOSPITAL_COMMUNITY): Payer: Self-pay | Admitting: Pharmacist

## 2019-12-10 LAB — POCT INR: INR: 2.3 (ref 2.0–3.0)

## 2019-12-10 NOTE — Progress Notes (Signed)
LVAD INR 

## 2019-12-17 ENCOUNTER — Ambulatory Visit (HOSPITAL_COMMUNITY): Payer: Self-pay | Admitting: Pharmacist

## 2019-12-17 LAB — POCT INR: INR: 2.2 (ref 2.0–3.0)

## 2019-12-17 NOTE — Progress Notes (Signed)
LVAD INR 

## 2019-12-24 ENCOUNTER — Ambulatory Visit (HOSPITAL_COMMUNITY): Payer: Self-pay | Admitting: Pharmacist

## 2019-12-24 LAB — POCT INR: INR: 2.5 (ref 2.0–3.0)

## 2019-12-24 NOTE — Progress Notes (Signed)
LVAD INR 

## 2019-12-31 ENCOUNTER — Ambulatory Visit (HOSPITAL_COMMUNITY): Payer: Self-pay | Admitting: Pharmacist

## 2019-12-31 LAB — POCT INR: INR: 2.2 (ref 2.0–3.0)

## 2019-12-31 NOTE — Progress Notes (Signed)
LVAD INR 

## 2020-01-07 ENCOUNTER — Ambulatory Visit (HOSPITAL_COMMUNITY): Payer: Self-pay | Admitting: Pharmacist

## 2020-01-07 LAB — POCT INR: INR: 2.1 (ref 2.0–3.0)

## 2020-01-07 NOTE — Progress Notes (Signed)
LVAD INR 

## 2020-01-14 ENCOUNTER — Ambulatory Visit (HOSPITAL_COMMUNITY): Payer: Self-pay | Admitting: Pharmacist

## 2020-01-14 LAB — POCT INR: INR: 2.2 (ref 2.0–3.0)

## 2020-01-14 NOTE — Progress Notes (Signed)
LVAD INR 

## 2020-01-21 ENCOUNTER — Ambulatory Visit (HOSPITAL_COMMUNITY): Payer: Self-pay | Admitting: Pharmacist

## 2020-01-21 LAB — POCT INR: INR: 2.1 (ref 2.0–3.0)

## 2020-01-21 NOTE — Progress Notes (Signed)
LVAD INR 

## 2020-01-28 ENCOUNTER — Ambulatory Visit (HOSPITAL_COMMUNITY): Payer: Self-pay | Admitting: Pharmacist

## 2020-01-28 LAB — POCT INR: INR: 2.2 (ref 2.0–3.0)

## 2020-01-28 NOTE — Progress Notes (Signed)
LVAD INR 

## 2020-01-29 ENCOUNTER — Telehealth (HOSPITAL_COMMUNITY): Payer: Self-pay | Admitting: Unknown Physician Specialty

## 2020-01-29 NOTE — Telephone Encounter (Signed)
Received page from pt while at dialysis this morning stating that his BP is high - 133/82. pts PI is 9s; flow around 3. Pt is currently taking Midodrine 5 mg on dailysis days. Pt has already taken his midodrine today. Pt states that he feels fine. Pt was instructed to call tomorrow if his increased PI persists and BP doesn't come down. We may need to d/c the midodrine.   [name] RN, BSN VAD Coordinator 24/7 Pager [phone]

## 2020-02-04 ENCOUNTER — Ambulatory Visit (HOSPITAL_COMMUNITY): Payer: Self-pay | Admitting: Pharmacist

## 2020-02-04 LAB — POCT INR: INR: 2.2 (ref 2.0–3.0)

## 2020-02-04 NOTE — Progress Notes (Signed)
LVAD INR 

## 2020-02-11 ENCOUNTER — Encounter (HOSPITAL_COMMUNITY): Payer: BC Managed Care – PPO

## 2020-02-11 ENCOUNTER — Ambulatory Visit (HOSPITAL_COMMUNITY): Payer: Self-pay | Admitting: Pharmacist

## 2020-02-11 LAB — POCT INR: INR: 2.1 (ref 2.0–3.0)

## 2020-02-11 NOTE — Progress Notes (Signed)
LVAD INR 

## 2020-02-18 ENCOUNTER — Ambulatory Visit (HOSPITAL_COMMUNITY): Payer: Self-pay | Admitting: Pharmacist

## 2020-02-18 ENCOUNTER — Encounter (HOSPITAL_COMMUNITY): Payer: BC Managed Care – PPO

## 2020-02-18 LAB — POCT INR: INR: 2.2 (ref 2.0–3.0)

## 2020-02-18 NOTE — Progress Notes (Signed)
LVAD INR 

## 2020-02-25 ENCOUNTER — Ambulatory Visit (HOSPITAL_COMMUNITY): Payer: Self-pay | Admitting: Pharmacist

## 2020-02-25 ENCOUNTER — Other Ambulatory Visit: Payer: Self-pay

## 2020-02-25 ENCOUNTER — Ambulatory Visit (HOSPITAL_COMMUNITY)
Admission: RE | Admit: 2020-02-25 | Discharge: 2020-02-25 | Disposition: A | Discharge status: Home / Self Care | Payer: Medicare Other | Source: Ambulatory Visit | Attending: Cardiology | Admitting: Cardiology

## 2020-02-25 VITALS — BP 108/0 | HR 96

## 2020-02-25 DIAGNOSIS — E1143 Type 2 diabetes mellitus with diabetic autonomic (poly)neuropathy: Secondary | ICD-10-CM | POA: Insufficient documentation

## 2020-02-25 DIAGNOSIS — Z86718 Personal history of other venous thrombosis and embolism: Secondary | ICD-10-CM | POA: Insufficient documentation

## 2020-02-25 DIAGNOSIS — G8918 Other acute postprocedural pain: Secondary | ICD-10-CM | POA: Diagnosis not present

## 2020-02-25 DIAGNOSIS — G8929 Other chronic pain: Secondary | ICD-10-CM | POA: Diagnosis not present

## 2020-02-25 DIAGNOSIS — Z9049 Acquired absence of other specified parts of digestive tract: Secondary | ICD-10-CM | POA: Insufficient documentation

## 2020-02-25 DIAGNOSIS — K219 Gastro-esophageal reflux disease without esophagitis: Secondary | ICD-10-CM | POA: Diagnosis not present

## 2020-02-25 DIAGNOSIS — E1122 Type 2 diabetes mellitus with diabetic chronic kidney disease: Secondary | ICD-10-CM | POA: Insufficient documentation

## 2020-02-25 DIAGNOSIS — D6851 Activated protein C resistance: Secondary | ICD-10-CM | POA: Diagnosis not present

## 2020-02-25 DIAGNOSIS — I255 Ischemic cardiomyopathy: Secondary | ICD-10-CM | POA: Insufficient documentation

## 2020-02-25 DIAGNOSIS — K3184 Gastroparesis: Secondary | ICD-10-CM | POA: Diagnosis not present

## 2020-02-25 DIAGNOSIS — Z95811 Presence of heart assist device: Secondary | ICD-10-CM

## 2020-02-25 DIAGNOSIS — I5022 Chronic systolic (congestive) heart failure: Secondary | ICD-10-CM

## 2020-02-25 DIAGNOSIS — Z7982 Long term (current) use of aspirin: Secondary | ICD-10-CM | POA: Diagnosis not present

## 2020-02-25 DIAGNOSIS — I509 Heart failure, unspecified: Secondary | ICD-10-CM | POA: Diagnosis not present

## 2020-02-25 DIAGNOSIS — I4892 Unspecified atrial flutter: Secondary | ICD-10-CM | POA: Diagnosis not present

## 2020-02-25 DIAGNOSIS — Z7901 Long term (current) use of anticoagulants: Secondary | ICD-10-CM | POA: Insufficient documentation

## 2020-02-25 DIAGNOSIS — Z794 Long term (current) use of insulin: Secondary | ICD-10-CM | POA: Insufficient documentation

## 2020-02-25 DIAGNOSIS — K5090 Crohn's disease, unspecified, without complications: Secondary | ICD-10-CM | POA: Insufficient documentation

## 2020-02-25 DIAGNOSIS — E785 Hyperlipidemia, unspecified: Secondary | ICD-10-CM | POA: Insufficient documentation

## 2020-02-25 DIAGNOSIS — Z79899 Other long term (current) drug therapy: Secondary | ICD-10-CM | POA: Insufficient documentation

## 2020-02-25 DIAGNOSIS — Z992 Dependence on renal dialysis: Secondary | ICD-10-CM

## 2020-02-25 DIAGNOSIS — I132 Hypertensive heart and chronic kidney disease with heart failure and with stage 5 chronic kidney disease, or end stage renal disease: Secondary | ICD-10-CM | POA: Insufficient documentation

## 2020-02-25 DIAGNOSIS — R110 Nausea: Secondary | ICD-10-CM | POA: Diagnosis not present

## 2020-02-25 DIAGNOSIS — Z89611 Acquired absence of right leg above knee: Secondary | ICD-10-CM | POA: Insufficient documentation

## 2020-02-25 DIAGNOSIS — N186 End stage renal disease: Secondary | ICD-10-CM

## 2020-02-25 DIAGNOSIS — I739 Peripheral vascular disease, unspecified: Secondary | ICD-10-CM | POA: Diagnosis not present

## 2020-02-25 DIAGNOSIS — I252 Old myocardial infarction: Secondary | ICD-10-CM | POA: Diagnosis not present

## 2020-02-25 DIAGNOSIS — Z955 Presence of coronary angioplasty implant and graft: Secondary | ICD-10-CM | POA: Insufficient documentation

## 2020-02-25 DIAGNOSIS — Z89612 Acquired absence of left leg above knee: Secondary | ICD-10-CM | POA: Insufficient documentation

## 2020-02-25 DIAGNOSIS — I2510 Atherosclerotic heart disease of native coronary artery without angina pectoris: Secondary | ICD-10-CM | POA: Diagnosis not present

## 2020-02-25 LAB — POCT INR: INR: 2.1 (ref 2.0–3.0)

## 2020-02-25 MED ORDER — PROMETHAZINE HCL 12.5 MG PO TABS: 12.5000 mg | ORAL_TABLET | Freq: Four times a day (QID) | ORAL | 3 refills | Status: DC | PRN

## 2020-02-25 NOTE — Progress Notes (Signed)
LVAD INR 

## 2020-02-25 NOTE — Progress Notes (Addendum)
Patient presents for 2 month follow up visit follow up in [facility] today with his son. Reports no problems with VAD equipment or concerns with drive line.   Pt states that his penis is much better and is no longer going to the wound care center.  He states that his pain is controlled well with the percocet. The pt has an appt with the pain doctor today.  He says dialysis is going well, he did have a low flow yesterday during his treatment with 40+. Pt states that he felt fine and didn't have anymore alarms.   Labs obtained from dialysis center from 02/19/20 including CBC, CMP, TSH. Dr. Aundra Dubin reviewed. Will fax orders to add an LDH to next lab draw at dialysis center.   Vital Signs:  Doppler Pressure  108 Automatc BP: 111/89 (97) HR:  96 SPO2: 98  Weight: UTO lb w/o eqt Last weight: 200 lb   VAD Indication: Destination therapy- Pt is no longer candidate for heart/kidney transplant due to PAD per [facility].   VAD interrogation & Equipment Management: Speed: 5500 Flow: 3.6 Power: 4.6w    PI: 5.6  Alarms: 1 low flow yesterday at dialysis; had 40+ PI at that time Events: rare-outlier on 6/21 40+  Fixed speed 5500 Low speed limit: 5200  Primary Controller:  Replace back up battery in 7 months. Back up controller:   Replace back up battery in 14 months  Annual Equipment Maintenance on UBC/PM was performed on 02/2020.   I reviewed the LVAD parameters from today and compared the results to the patient's prior recorded data. LVAD interrogation was NEGATIVE for significant power changes, NEGATIVE for clinical alarms and STABLE for PI events/speed drops. No programming changes were made and pump is functioning within specified parameters. Pt is performing daily controller and system monitor self tests along with completing weekly and monthly maintenance for LVAD equipment.  LVAD equipment check completed and is in good working order. Back-up equipment present.  Exit Site  Care: Drive line is being maintained weekly by daughter. CDI today. Anchor secure, but drive line not secured in same.  Pt verbalized understanding of same. Provided patient with 8 weekly dressing kits for home use.   Significant Events on VAD Support: -10/2016>> poss drive line infection, CT ABD neg, ID consult-doxy -11/09/16>> admit for poss drive line infection, IV abx -03/24/17>> doxy for poss drive line infection -05/04/17>> drive line debridement with wound-vac -06/2017>> drive line +proteus, IV abx, Bactrim x14 days  Device:St jude Dual Therapies: on 200 bpm Last check: 01/01/18 - 2 hrs Afl/Afib; EMI 04/02/18  BP & Labs:  Doppler BP 108 - is reflecting modified systolic.  Hgb 12.4; difficult venous access. Result  from dialysis labs 02/19/20 - No S/S of bleeding. Specifically denies melena/BRBPR or nosebleeds.  LDH not done today, difficult venous access. Denies tea-colored urine. No power elevations noted on interrogation.             Annual maintenance completed per [employer] on patient's home MPU and universal Char[employer] fundraiser.    Backup system controller 11 volt battery charged during visit.   2 year Intermacs follow up completed including:  Quality of Life, KCCQ-12, and Neurocognitive trail making.   Pt unable to complete 6 minute walk as he is bilateral AKA.  [facility] Cardiomyopathy Questionnaire  KCCQ-12 02/25/2020  1 a. Ability to shower/bathe Other, Did not do  1 b. Ability to walk 1 block Other, Did not do  1 c. Ability to hurry/jog  Other, Did not do  3. Limited by fatigue Never over the past 2 weeks  4. Limited by dyspnea Several times a day  5. Sitting up / on 3+ pillows Never over the past 2 weeks  6. Limited enjoyment of life Not limited at all  7. Rest of life w/ symptoms Mostly satisfied  8 a. Participation in hobbies Slightly limited  8 b. Participation in chores Slightly limited  8 c. Visiting family/friends Slightly limited       Robyne Peers, RN   Patient Instructions:  1. No medication changes. 2. Will fax orders for LDH on next lab draw at dialysis. 3. Refill sent for phenergan to pts pharmacy. 2. Return to [place] [facility] in 2 months with echocardiogram   [name] RN [place] Coordinator  Office: [phone]  24/7 Pager: [phone]      Cardiology: Dr. Aundra Dubin  HPI: 54 y.o. with history of CAD s/p anterior MI in 2010 and NSTEMI in 3/18 with DES to pRCA and mLAD and ischemic cardiomyopathy now s/p Heartmate 3 LVAD and ESRD presents for followup of CHF/LVAD.  Patient developed low output heart failure.  He was not deemed to be a good transplant candidate.  He was admitted in 10/18 and Heartmate 3 LVAD was placed.  He had baseline CKD stage 3 likely from diabetic nephropathy and CHF.  He developed peri-op AKI and ended up on dialysis.  No renal recovery to date.  St Jude ICD has been turned back on and ramp echo was done prior to discharge in 10/18, speed increased to 5600 rpm.   He was admitted in 11/18 with nausea/vomiting and abdominal pain.  This appears to have been due to diabetic gastroparesis (extensive GI evaluation). He had atrial flutter this admission that was converted to NSR with amiodarone.  He improved considerably with scheduled Reglan and erythromycin. He was re-admitted later in 11/18 with the same symptoms, suspect diabetic gastroparesis and was again treated with erythromycin.  He was not able to start domperidone due to prolonged QT interval. He had nausea again about a week ago but was able to manage at home with Phenergan suppositories.   Admitted 1/8 -> 09/16/17 for uncontrolled vomiting in setting of diabetic gastroparesis. Improved with phenergan suppositories and given erythromycin TID for 7 days post-discharge.    He was admitted in 2/19 for AV graft placement.  While in the hospital, we turned down his speed.  Since then, he has had considerably fewer PI events on HD days.    He was admitted  in 4/19 with nausea/vomiting again, probably gastroparesis flare.   He was admitted in 5/19 with nausea/vomiting, likely gastroparesis flare, treated with erythromycin course which he is finishing.   He has been evaluated at [facility] by Dr. Derrill Kay (gastroparesis specialist).   Gastric emptying study, surprisingly, was near normal. However, electrogastrogram showed poor gastric accomodation/capacity, "bradygastria."  Frequent small meals were recommended. Also, early am nausea was thought to be possible GERD.    Admitted 6/26 - 03/19/18 for emergent VAD exchange 03/01/18 in the setting of HM3 outflow graft kink. He required CVVHD post op, but was transtioned back to [facility] without problems. Course complicated by leukocytosis and fever. CT abd/chest negative for infection and blood cultures were negative. Post op pain well controlled withraretramadol. Able to tolerate diet. Evaluated by PT/OT and HH PT recommended for discharge. Ramp echo completed prior to discharge with speed optimization. Resumed81 mg ASA + coumadin with INR goal 2-3.  He  was admitted 03/23/18 with nausea/gastroparesis symptoms and marked HTN at outpatient HD.  Erythromycin was restarted and gastroparesis symptoms were controlled with fall in BP and discharge on 03/25/18.   He was admitted in 9/19 for LUE AV fistula placement.  He was readmitted in 10/19 with his typical gastroparesis symptoms, unable to take po. The fistula later failed and was replaced with a graft.  However, the graft was nonfunctional and developed a hematoma at the surgical site.    He was admitted in 12/19 with nausea/vomiting from HD. Also with LDH increased to 427 in setting of extensive hematoma around his left arm AV graft.  He has had tingling in his left hand and left grip is weak.    In 7/20, he was admitted with ischemic 4th toe on right foot.  Peripheral angiogram 03/18/19 showed 80% right SFA stenosis and occluded right PT. He had stenting of SFA and  PTCA of PT with restoration of flow. However, he still needed amputation right 4th toe on 7/16 with intractable pain.  He was readmitted with pain in his right foot, nausea, and vomiting. There was concern for ongoing right foot ischemia with all toes and a portion of the foot now dusky.  He was treated with IV vancomycin for possible osteomyelitis/infection, but thought that more likely ischemia could explain all the findings.  He was discharged home to followup with vascular surgery.   He was re-admitted in 8/20 with ongoing right foot pain and ended up having transmetatarsal amputation of the right foot.  Left leg angiogram was done with DES to left SFA, only 1 vessel runoff via severely diseased peroneal artery, unable to intervene.   He was readmitted in 9/20 with ongoing right foot/lower leg pain and had right AKA.  He was discharged to [facility].    He was then admitted with gangrene in the remnant of his left foot in 11/20.  He had left AKA.    Ulceration on head of penis is improved. No longer going to wound clinic.  He goes to a pain clinic, this has worked out well.  Stable overall.  No problems with HD.  He can transfer to from bed to wheelchair without dyspnea.  No lightheadedness or chest pain.  He is no longer taking Marinol, good appetite. On LVAD interrogation, 1 low flow recently associated with HD.   Reports taking Coumadin as prescribed and adherence to anticoagulation based dietary restrictions. Denies BRBPR or melena. No dark urine or hematuria.   Labs (11/18): hgb 10.4 => 8.7, LDH 213 Labs (08/04/17): hgb 9 Labs (1/19): HgbA1c 6.1 Labs (2/19): hgb 13.2 Labs (3/19): hgb 11.1 Labs (7/19): hgb 9.6 Labs (10/19: hgb 11.8 Labs (12/19): LDH 427 => 237, hgb 7.7 Labs (11/20): hgb 8.1 Labs (1/21): HIV negative, RPR negative Labs (3/21): hgb 8.8 Labs (6/21): hgb 12.4  PMH: 1. Chronic systolic CHF: Ischemic cardiomyopathy. St Jude ICD.  - Echo (11/17) with EF 25-30%,  moderate diastolic dysfunction, mild MR, mildly dilated RV with moderately decreased systolic function, peak RV-RA gradient 48 mmHg.  - Echo (3/18) with EF 25-30%, wall motion abnormalities, normal RV size and systolic function.  - Admission 3/18 with cardiogenic shock and AKI, required milrinone gtt.  - Echo (8/18) with EF 20-25%, mild LVH, moderate LV dilation, mild MR.  - CPX (8/18) with peak VO2 12.4, VE/VCO2 slope 35, RER 1.05 => severe HR limitation.  - Heartmate 3 LVAD 10/18.  - Pump thrombosis in setting of HM3 outflow graft  kink.  Pump exchange 6/19 for HM3.  2. CAD: Anterior MI 2005, had bifurcation stenting LAD/diagonal. Repeat cath 2010 with patent stents.  - NSTEMI 3/18: LHC with 99% ulcerated lesion proximal RCA, 95% mid LAD => PCI with DES to proximal RCA and mid LAD.  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  - Lower extremity venous dopplers (8/18): chronic DVT - V/Q scan (8/18): Normal, no PE.  4. Crohns disease: History of colectomy.  5. GERD 6. Diabetic gastroparesis: Admission 11/18 with abdominal pain, nausea/vomiting.  - [facility] workup 2019: Gastric emptying study near normal.  Negative breath test (no evidence for small bowel bacterial overgrowth).  Electrogastrogram showed poor gastric accomodation/capacity, "bradygastria."  7. HTN 8. Hyperlipidemia 9. Type II diabetes  10. ESRD: Post-op LVAD surgery.  He has had failed AV fistula and AV graft, now dialyzing via catheter . 11. Carotid dopplers (8/18): mild bilateral stenosis.  12. PFTs (8/18): Mild restriction (no IDL on CT chest).  13. Lung nodules: CT chest 8/18 with small bilateral nodules.  14. PAD: ABIs with moderate bilateral disease in 8/18.  - Peripheral angiogram 03/18/19 showed 80% right SFA stenosis and occluded right PT. He had stenting of SFA and PTCA of PT with restoration of flow. However, he still needed amputation right 4th toe on 03/21/19 with intractable pain.  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  -  Peripheral angiogram left leg: DES to left SFA, only 1 vessel runoff via severely diseased peroneal artery, unable to intervene. 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  - Left AKA 11/20.  15. Atrial flutter: In 11/18 associated with nausea/vomiting from gastroparesis.   Current Outpatient Medications  Medication Sig Dispense Refill  . aspirin EC 81 MG tablet Take 1 tablet (81 mg total) by mouth daily. 90 tablet 3  . atorvastatin (LIPITOR) 40 MG tablet Take 1 tablet (40 mg total) by mouth daily at 6 PM. 90 tablet 3  . calcitRIOL (ROCALTROL) 0.5 MCG capsule Take 1 capsule (0.5 mcg total) by mouth every Monday, Wednesday, and Friday. 15 capsule 5  . citalopram (CELEXA) 20 MG tablet Take 1 tablet (20 mg total) by mouth daily. 30 tablet 6  . gabapentin (NEURONTIN) 300 MG capsule Take 1 capsule (300 mg total) by mouth 2 (two) times daily. 60 capsule 2  . insulin glargine (LANTUS) 100 UNIT/ML injection Inject 0.1 mLs (10 Units total) into the skin at bedtime. (Patient taking differently: Inject 20 Units into the skin at bedtime. ) 10 mL 3  . LORazepam (ATIVAN) 0.5 MG tablet Take 1 tablet (0.5 mg total) by mouth every 12 (twelve) hours as needed (nausea). 60 tablet 2  . metoCLOPramide (REGLAN) 10 MG tablet Take 1 tablet (10 mg total) by mouth 3 (three) times daily. (Patient taking differently: Take 10 mg by mouth in the morning and at bedtime. ) 90 tablet 6  . midodrine (PROAMATINE) 5 MG tablet Take 1 tablet (5 mg total) by mouth daily. Take 1 tablet before dialysis treatment (Patient taking differently: Take 5 mg by mouth every Monday, Wednesday, and Friday. Take 1 tablet before dialysis treatment) 30 tablet 6  . multivitamin (RENA-VIT) TABS tablet Take 1 tablet by mouth daily.    [name] oxyCODONE-acetaminophen (PERCOCET) 5-325 MG tablet Take 1 tablet by mouth every 6 (six) hours as needed for severe pain. 10 tablet 0  . pantoprazole (PROTONIX) 40 MG tablet Take 1 tablet (40 mg total) by mouth 2 (two) times daily before lunch and  supper. 60 tablet 11  . promethazine (PHENERGAN) 12.5 MG  tablet Take 1 tablet (12.5 mg total) by mouth every 6 (six) hours as needed for nausea or vomiting. 30 tablet 3  . sevelamer carbonate (RENVELA) 0.8 g PACK packet Take 0.8 g by mouth 3 (three) times daily with meals. 270 each 1  . warfarin (COUMADIN) 5 MG tablet Take 1 tablet (5 mg total) by mouth daily. 30 tablet 11  . collagenase (SANTYL) ointment Apply topically daily. Apply to buttocks wound daily (Patient not taking: Reported on 02/25/2020) 15 g 2  . dronabinol (MARINOL) 2.5 MG capsule Take 1 capsule (2.5 mg total) by mouth 2 (two) times daily before lunch and supper. (Patient not taking: Reported on 02/25/2020) 60 capsule 5  . Isavuconazonium Sulfate 186 MG CAPS Take 2 capsules (372 mg total) by mouth daily. (Patient not taking: Reported on 02/25/2020) 30 capsule 0  . oxyCODONE (ROXICODONE) 5 MG immediate release tablet Take 1 tablet (5 mg total) by mouth 2 (two) times daily as needed for severe pain. Take 1-2 tablets every 4-6 hours for severe pain. (Patient not taking: Reported on 02/25/2020) 30 tablet 0  . silver sulfADIAZINE (SILVADENE) 1 % cream Apply 1 application topically daily. (Patient not taking: Reported on 02/25/2020) 50 g 0  . traMADol (ULTRAM) 50 MG tablet Take 1 tablet (50 mg total) by mouth every 6 (six) hours as needed. (Patient not taking: Reported on 02/25/2020) 20 tablet 0   No current facility-administered medications for this encounter.    Metformin and related   Review of systems complete and found to be negative unless listed in HPI.    BP (!) 108/0   Pulse 96   SpO2 98%   MAP 97  VAD interrogation & Equipment Management: See LVAD nurse's note above.   General: Well appearing this am. NAD.  HEENT: Normal. Neck: Supple, JVP 7-8 cm. Carotids OK.  Cardiac:  Mechanical heart sounds with LVAD hum present.  Lungs:  CTAB, normal effort.  Abdomen:  NT, ND, no HSM. No bruits or masses. +BS  LVAD exit site:  Well-healed and incorporated. Dressing dry and intact. No erythema or drainage. Stabilization device present and accurately applied. Driveline dressing changed daily per sterile technique. Extremities:  S/p bilateral AKA. No stump edema.  Neuro:  Alert & oriented x 3. Cranial nerves grossly intact. Moves all 4 extremities w/o difficulty. Affect pleasant    ASSESSMENT AND PLAN: 1. PAD/ischemic feet:  He is now s/p bilateral AKAs.  Coping reasonably well, able to transfer from bed to wheelchair on his own.  2. Chronic systolic CHF: Ischemic cardiomyopathy. St Jude ICD. NSTEMI in March 2018 with DES to LAD and RCA, complicated by cardiogenic shock, low output requiring milrinone. Echo 8/18 with EF 20-25%, moderate MR. CPX (8/18) with severe functional impairment due to HF. He is s/p HM3 LVAD placement. He had pump thrombosis 6/19 due to [facility] outflow graft kinking. He had pump exchange for another HM3.   - Volume managed at dialysis, MWF.   - Continue ASA 81and warfarin with INR goal 2-2.5.    - He had been referred to [facility] for evaluation for heart/kidney transplant, but he is not candidate for transplantnowgiven extensive PAD. - Will obtain echo at next appt.  - Needs LDH, can get with next HD.  3. ESRD: HD on M-W-F with midodrine pre-HD.  4.CAD: NSTEMI in 3/18. LHC with 99% ulcerated lesion proximal RCA with left to right collaterals, 95% mid LAD stenosis after mid LAD stent. s/p PCI to RCA and LAD on 11/25/16.No  chest pain.  - Continue statin and ASA. 5. h/o DVTs: Factor V Leiden heterozygote. -Anticoagulated. 6. Diabetic gastroparesis: He has seen gastroparesis specialist at [facility]. Surprisingly, gastric emptying study was normal. However, electrogastrogram showed poor gastric accomodation/capacity. Therefore, small frequent meals recommended. - Continue reglan 10 mg tid/ac, now off Marinol.  - Continue Protonix bid.  - Limit narcotic use as much as possible.  7. HTN:MAP  generally controlled.  8. 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  RPR and HIV negative.  Seen by ID, treated with Valtrex and Augmentin.  Resolving.  9. Chronic pain: Now seen in a pain clinic.    [name] 02/25/2020

## 2020-02-25 NOTE — Patient Instructions (Signed)
1. No medication changes. 2. Return to [facility] in 2 months with echocardiogram

## 2020-02-26 ENCOUNTER — Other Ambulatory Visit (HOSPITAL_COMMUNITY): Payer: Self-pay | Admitting: Cardiology

## 2020-02-26 DIAGNOSIS — E1143 Type 2 diabetes mellitus with diabetic autonomic (poly)neuropathy: Secondary | ICD-10-CM

## 2020-02-26 DIAGNOSIS — R110 Nausea: Secondary | ICD-10-CM

## 2020-02-26 MED ORDER — LORAZEPAM 0.5 MG PO TABS: 0.5000 mg | ORAL_TABLET | Freq: Two times a day (BID) | ORAL | 0 refills | Status: DC | PRN

## 2020-03-03 ENCOUNTER — Ambulatory Visit (HOSPITAL_COMMUNITY): Payer: Self-pay | Admitting: Pharmacist

## 2020-03-03 LAB — POCT INR: INR: 2.2 (ref 2.0–3.0)

## 2020-03-03 NOTE — Progress Notes (Signed)
LVAD INR 

## 2020-03-10 ENCOUNTER — Ambulatory Visit (HOSPITAL_COMMUNITY): Payer: Self-pay | Admitting: Pharmacist

## 2020-03-10 LAB — POCT INR: INR: 2 (ref 2.0–3.0)

## 2020-03-10 NOTE — Progress Notes (Signed)
LVAD INR 

## 2020-03-17 ENCOUNTER — Ambulatory Visit (HOSPITAL_COMMUNITY): Payer: Self-pay | Admitting: Pharmacist

## 2020-03-17 LAB — POCT INR: INR: 2.4 (ref 2.0–3.0)

## 2020-03-24 ENCOUNTER — Ambulatory Visit (HOSPITAL_COMMUNITY): Payer: Self-pay | Admitting: Pharmacist

## 2020-03-24 LAB — POCT INR: INR: 2.1 (ref 2.0–3.0)

## 2020-03-24 NOTE — Progress Notes (Signed)
LVAD INR 

## 2020-03-31 ENCOUNTER — Ambulatory Visit (HOSPITAL_COMMUNITY): Payer: Self-pay | Admitting: Pharmacist

## 2020-03-31 LAB — POCT INR: INR: 2.1 (ref 2.0–3.0)

## 2020-03-31 NOTE — Progress Notes (Signed)
LVAD INR 

## 2020-04-07 ENCOUNTER — Ambulatory Visit (HOSPITAL_COMMUNITY): Payer: Self-pay | Admitting: Pharmacist

## 2020-04-07 LAB — POCT INR: INR: 2.6 (ref 2.0–3.0)

## 2020-04-07 NOTE — Progress Notes (Signed)
LVAD INR 

## 2020-04-14 ENCOUNTER — Ambulatory Visit (HOSPITAL_COMMUNITY): Payer: Self-pay | Admitting: Pharmacist

## 2020-04-14 LAB — POCT INR: INR: 2.4 (ref 2.0–3.0)

## 2020-04-14 NOTE — Progress Notes (Signed)
LVAD INR 

## 2020-04-21 ENCOUNTER — Ambulatory Visit (HOSPITAL_COMMUNITY): Payer: Self-pay | Admitting: Pharmacist

## 2020-04-21 LAB — POCT INR: INR: 2.3 (ref 2.0–3.0)

## 2020-04-21 NOTE — Progress Notes (Signed)
LVAD INR 

## 2020-04-28 ENCOUNTER — Ambulatory Visit (HOSPITAL_COMMUNITY): Payer: Self-pay | Admitting: Pharmacist

## 2020-04-28 ENCOUNTER — Ambulatory Visit (HOSPITAL_BASED_OUTPATIENT_CLINIC_OR_DEPARTMENT_OTHER)
Admission: RE | Admit: 2020-04-28 | Discharge: 2020-04-28 | Disposition: A | Discharge status: Home / Self Care | Payer: Medicare Other | Source: Ambulatory Visit | Attending: Cardiology | Admitting: Cardiology

## 2020-04-28 ENCOUNTER — Ambulatory Visit (HOSPITAL_COMMUNITY)
Admission: RE | Admit: 2020-04-28 | Discharge: 2020-04-28 | Disposition: A | Discharge status: Home / Self Care | Payer: Medicare Other | Source: Ambulatory Visit | Attending: Internal Medicine | Admitting: Internal Medicine

## 2020-04-28 ENCOUNTER — Other Ambulatory Visit: Payer: Self-pay

## 2020-04-28 VITALS — BP 121/41 | HR 83

## 2020-04-28 DIAGNOSIS — Z9049 Acquired absence of other specified parts of digestive tract: Secondary | ICD-10-CM | POA: Insufficient documentation

## 2020-04-28 DIAGNOSIS — Z955 Presence of coronary angioplasty implant and graft: Secondary | ICD-10-CM | POA: Diagnosis not present

## 2020-04-28 DIAGNOSIS — Z7901 Long term (current) use of anticoagulants: Secondary | ICD-10-CM | POA: Diagnosis not present

## 2020-04-28 DIAGNOSIS — I252 Old myocardial infarction: Secondary | ICD-10-CM | POA: Insufficient documentation

## 2020-04-28 DIAGNOSIS — K219 Gastro-esophageal reflux disease without esophagitis: Secondary | ICD-10-CM | POA: Diagnosis not present

## 2020-04-28 DIAGNOSIS — Z95810 Presence of automatic (implantable) cardiac defibrillator: Secondary | ICD-10-CM | POA: Insufficient documentation

## 2020-04-28 DIAGNOSIS — I351 Nonrheumatic aortic (valve) insufficiency: Secondary | ICD-10-CM | POA: Insufficient documentation

## 2020-04-28 DIAGNOSIS — Z86718 Personal history of other venous thrombosis and embolism: Secondary | ICD-10-CM | POA: Insufficient documentation

## 2020-04-28 DIAGNOSIS — K3184 Gastroparesis: Secondary | ICD-10-CM | POA: Insufficient documentation

## 2020-04-28 DIAGNOSIS — N186 End stage renal disease: Secondary | ICD-10-CM | POA: Diagnosis not present

## 2020-04-28 DIAGNOSIS — Z7982 Long term (current) use of aspirin: Secondary | ICD-10-CM | POA: Diagnosis not present

## 2020-04-28 DIAGNOSIS — Z794 Long term (current) use of insulin: Secondary | ICD-10-CM | POA: Insufficient documentation

## 2020-04-28 DIAGNOSIS — E1143 Type 2 diabetes mellitus with diabetic autonomic (poly)neuropathy: Secondary | ICD-10-CM | POA: Insufficient documentation

## 2020-04-28 DIAGNOSIS — E1151 Type 2 diabetes mellitus with diabetic peripheral angiopathy without gangrene: Secondary | ICD-10-CM | POA: Diagnosis not present

## 2020-04-28 DIAGNOSIS — K5090 Crohn's disease, unspecified, without complications: Secondary | ICD-10-CM | POA: Insufficient documentation

## 2020-04-28 DIAGNOSIS — D6851 Activated protein C resistance: Secondary | ICD-10-CM | POA: Diagnosis not present

## 2020-04-28 DIAGNOSIS — I4892 Unspecified atrial flutter: Secondary | ICD-10-CM | POA: Insufficient documentation

## 2020-04-28 DIAGNOSIS — I132 Hypertensive heart and chronic kidney disease with heart failure and with stage 5 chronic kidney disease, or end stage renal disease: Secondary | ICD-10-CM | POA: Diagnosis not present

## 2020-04-28 DIAGNOSIS — Z95811 Presence of heart assist device: Secondary | ICD-10-CM

## 2020-04-28 DIAGNOSIS — E1122 Type 2 diabetes mellitus with diabetic chronic kidney disease: Secondary | ICD-10-CM | POA: Diagnosis not present

## 2020-04-28 DIAGNOSIS — I509 Heart failure, unspecified: Secondary | ICD-10-CM

## 2020-04-28 DIAGNOSIS — Z89612 Acquired absence of left leg above knee: Secondary | ICD-10-CM | POA: Diagnosis not present

## 2020-04-28 DIAGNOSIS — I255 Ischemic cardiomyopathy: Secondary | ICD-10-CM | POA: Diagnosis not present

## 2020-04-28 DIAGNOSIS — E785 Hyperlipidemia, unspecified: Secondary | ICD-10-CM | POA: Insufficient documentation

## 2020-04-28 DIAGNOSIS — Z79899 Other long term (current) drug therapy: Secondary | ICD-10-CM | POA: Insufficient documentation

## 2020-04-28 DIAGNOSIS — Z992 Dependence on renal dialysis: Secondary | ICD-10-CM | POA: Insufficient documentation

## 2020-04-28 DIAGNOSIS — Z89611 Acquired absence of right leg above knee: Secondary | ICD-10-CM | POA: Diagnosis not present

## 2020-04-28 DIAGNOSIS — I5022 Chronic systolic (congestive) heart failure: Secondary | ICD-10-CM | POA: Insufficient documentation

## 2020-04-28 LAB — ECHOCARDIOGRAM COMPLETE
Area-P 1/2: 3.45 cm2
S' Lateral: 3.3 cm

## 2020-04-28 LAB — POCT INR: INR: 2.3 (ref 2.0–3.0)

## 2020-04-28 MED ORDER — MIDODRINE HCL 5 MG PO TABS
10.0000 mg | ORAL_TABLET | ORAL | 6 refills | Status: DC
Start: 2020-04-29 — End: 2020-06-30

## 2020-04-28 MED ORDER — MIDODRINE HCL 5 MG PO TABS: 5.0000 mg | ORAL_TABLET | ORAL | 6 refills | Status: DC

## 2020-04-28 NOTE — Progress Notes (Addendum)
Patient presents for 2 month follow up visit follow up in [facility] today with his son. Reports no problems with VAD equipment or concerns with drive line.   He says dialysis is going well, but he is starting to have more low flows during treatment. Pt states that he feels like he has gained "real" weight and that the dialysis doctor thinks it is "water" weight so they are pulling more fluid than they need to be. Pt has 80+ PI from yesterday during diaylsis and only 4 PI today.  Labs obtained from dialysis center from 02/19/20 including CBC, CMP, TSH and LDH. Dr. Aundra Dubin reviewed.   Vital Signs:  Doppler Pressure  114 Automatc BP: 121/41 (70) HR:  83 SPO2: 98  Weight: UTO lb w/o eqt Last weight: 200 lb   VAD Indication: Destination therapy- Pt is no longer candidate for heart/kidney transplant due to PAD per [facility].   VAD interrogation & Equipment Management: Speed: 5500 Flow: 3.7 Power: 4.6w    PI: 5.9  Alarms: none Events:80+ yesterday at dialysis 4 today  Fixed speed 5500 Low speed limit: 5200  Primary Controller:  Expiring replaced with GV [id number]. Replace back up battery in 34 months. Back up controller:   Replace back up battery in 12 months  Annual Equipment Maintenance on UBC/PM was performed on 02/2020.   I reviewed the LVAD parameters from today and compared the results to the patient's prior recorded data. LVAD interrogation was NEGATIVE for significant power changes, NEGATIVE for clinical alarms and STABLE for PI events/speed drops. No programming changes were made and pump is functioning within specified parameters. Pt is performing daily controller and system monitor self tests along with completing weekly and monthly maintenance for LVAD equipment.  LVAD equipment check completed and is in good working order. Back-up equipment present.  Exit Site Care: Drive line is being maintained weekly by daughter. CDI today. Anchor secure, but drive line not secured in  same.  Pt verbalized understanding of same. Provided patient with 8 weekly dressing kits for home use.   Significant Events on VAD Support: -10/2016>> poss drive line infection, CT ABD neg, ID consult-doxy -11/09/16>> admit for poss drive line infection, IV abx -03/24/17>> doxy for poss drive line infection -05/04/17>> drive line debridement with wound-vac -06/2017>> drive line +proteus, IV abx, Bactrim x14 days  Device:St jude Dual Therapies: on 200 bpm Last check: 01/01/18 - 2 hrs Afl/Afib; EMI 04/02/18  BP & Labs:  Doppler BP 114 - is reflecting modified systolic.  Hgb 10.8; difficult venous access. Result  from dialysis labs 04/15/20 - No S/S of bleeding. Specifically denies melena/BRBPR or nosebleeds.  LDH 201, difficult venous access. Results from dialysis labs 04/15/20 - Denies tea-colored urine. No power elevations noted on interrogation.    Patient Instructions:  1. Increase Midodrine to 2 tablets on MWF prior to dialysis. 2. Return to clinic in 2 months  [name] RN [facility] Coordinator  Office: [phone]  24/7 Pager: [phone]      Cardiology: Dr. Aundra Dubin  HPI: 54 y.o. with history of CAD s/p anterior MI in 2010 and NSTEMI in 3/18 with DES to pRCA and mLAD and ischemic cardiomyopathy now s/p Heartmate 3 LVAD and ESRD presents for followup of CHF/LVAD.  Patient developed low output heart failure.  He was not deemed to be a good transplant candidate.  He was admitted in 10/18 and Heartmate 3 LVAD was placed.  He had baseline CKD stage 3 likely from diabetic nephropathy and CHF.  He developed peri-op AKI and ended up on dialysis.  No renal recovery to date.  St Jude ICD has been turned back on and ramp echo was done prior to discharge in 10/18, speed increased to 5600 rpm.   He was admitted in 11/18 with nausea/vomiting and abdominal pain.  This appears to have been due to diabetic gastroparesis (extensive GI evaluation). He had atrial flutter this admission that was  converted to NSR with amiodarone.  He improved considerably with scheduled Reglan and erythromycin. He was re-admitted later in 11/18 with the same symptoms, suspect diabetic gastroparesis and was again treated with erythromycin.  He was not able to start domperidone due to prolonged QT interval. He had nausea again about a week ago but was able to manage at home with Phenergan suppositories.   Admitted 1/8 -> 09/16/17 for uncontrolled vomiting in setting of diabetic gastroparesis. Improved with phenergan suppositories and given erythromycin TID for 7 days post-discharge.    He was admitted in 2/19 for AV graft placement.  While in the hospital, we turned down his speed.  Since then, he has had considerably fewer PI events on HD days.    He was admitted in 4/19 with nausea/vomiting again, probably gastroparesis flare.   He was admitted in 5/19 with nausea/vomiting, likely gastroparesis flare, treated with erythromycin course which he is finishing.   He has been evaluated at [facility] by Dr. Derrill Kay (gastroparesis specialist).   Gastric emptying study, surprisingly, was near normal. However, electrogastrogram showed poor gastric accomodation/capacity, "bradygastria."  Frequent small meals were recommended. Also, early am nausea was thought to be possible GERD.    Admitted 6/26 - 03/19/18 for emergent VAD exchange 03/01/18 in the setting of HM3 outflow graft kink. He required CVVHD post op, but was [facility]st[facility] back to [facility] without problems. Course complicated by leukocytosis and fever. CT abd/chest negative for infection and blood cultures were negative. Post op pain well controlled withraretramadol. Able to tolerate diet. Evaluated by PT/OT and HH PT recommended for discharge. Ramp echo completed prior to discharge with speed optimization. Resumed81 mg ASA + coumadin with INR goal 2-3.  He was admitted 03/23/18 with nausea/gastroparesis symptoms and marked HTN at outpatient HD.  Erythromycin was restarted  and gastroparesis symptoms were controlled with fall in BP and discharge on 03/25/18.   He was admitted in 9/19 for LUE AV fistula placement.  He was readmitted in 10/19 with his typical gastroparesis symptoms, unable to take po. The fistula later failed and was replaced with a graft.  However, the graft was nonfunctional and developed a hematoma at the surgical site.    He was admitted in 12/19 with nausea/vomiting from HD. Also with LDH increased to 427 in setting of extensive hematoma around his left arm AV graft.  He has had tingling in his left hand and left grip is weak.    In 7/20, he was admitted with ischemic 4th toe on right foot.  Peripheral angiogram 03/18/19 showed 80% right SFA stenosis and occluded right PT. He had stenting of SFA and PTCA of PT with restoration of flow. However, he still needed amputation right 4th toe on 7/16 with intractable pain.  He was readmitted with pain in his right foot, nausea, and vomiting. There was concern for ongoing right foot ischemia with all toes and a portion of the foot now dusky.  He was treated with IV vancomycin for possible osteomyelitis/infection, but thought that more likely ischemia could explain all the findings.  He was  discharged home to followup with vascular surgery.   He was re-admitted in 8/20 with ongoing right foot pain and ended up having transmetatarsal amputation of the right foot.  Left leg angiogram was done with DES to left SFA, only 1 vessel runoff via severely diseased peroneal artery, unable to intervene.   He was readmitted in 9/20 with ongoing right foot/lower leg pain and had right AKA.  He was discharged to [facility].    He was then admitted with gangrene in the remnant of his left foot in 11/20.  He had left AKA.    He returns today for followup of LVAD.  Symptomatically stable.  He transfers to and from his wheelchair and is able to wheel his chair without dyspnea.  He has low flow episodes and multiple PI events on  HD days.  No lightheadedness or dizziness with HD but he does get hand cramps.  He thinks his dry weight is set too low and that he has gained non-fluid weight.  MAP low at times with HD.   Reports taking Coumadin as prescribed and adherence to anticoagulation based dietary restrictions. Denies BRBPR or melena. No dark urine or hematuria.   Labs (11/18): hgb 10.4 => 8.7, LDH 213 Labs (08/04/17): hgb 9 Labs (1/19): HgbA1c 6.1 Labs (2/19): hgb 13.2 Labs (3/19): hgb 11.1 Labs (7/19): hgb 9.6 Labs (10/19: hgb 11.8 Labs (12/19): LDH 427 => 237, hgb 7.7 Labs (11/20): hgb 8.1 Labs (1/21): HIV negative, RPR negative Labs (3/21): hgb 8.8 Labs (6/21): hgb 12.4 Labs (8/21): hgb 10.8, LDH 201  PMH: 1. Chronic systolic CHF: Ischemic cardiomyopathy. St Jude ICD.  - Echo (11/17) with EF 25-30%, moderate diastolic dysfunction, mild MR, mildly dilated RV with moderately decreased systolic function, peak RV-RA gradient 48 mmHg.  - Echo (3/18) with EF 25-30%, wall motion abnormalities, normal RV size and systolic function.  - Admission 3/18 with cardiogenic shock and AKI, required milrinone gtt.  - Echo (8/18) with EF 20-25%, mild LVH, moderate LV dilation, mild MR.  - CPX (8/18) with peak VO2 12.4, VE/VCO2 slope 35, RER 1.05 => severe HR limitation.  - Heartmate 3 LVAD 10/18.  - Pump thrombosis in setting of HM3 outflow graft kink.  Pump exchange 6/19 for HM3.  2. CAD: Anterior MI 2005, had bifurcation stenting LAD/diagonal. Repeat cath 2010 with patent stents.  - NSTEMI 3/18: LHC with 99% ulcerated lesion proximal RCA, 95% mid LAD => PCI with DES to proximal RCA and mid LAD.  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  - Lower extremity venous dopplers (8/18): chronic DVT - V/Q scan (8/18): Normal, no PE.  4. Crohns disease: History of colectomy.  5. GERD 6. Diabetic gastroparesis: Admission 11/18 with abdominal pain, nausea/vomiting.  - [facility] workup 2019: Gastric emptying study near normal.  Negative breath  test (no evidence for small bowel bacterial overgrowth).  Electrogastrogram showed poor gastric accomodation/capacity, "bradygastria."  7. HTN 8. Hyperlipidemia 9. Type II diabetes  10. ESRD: Post-op LVAD surgery.  He has had failed AV fistula and AV graft, now dialyzing via catheter . 11. Carotid dopplers (8/18): mild bilateral stenosis.  12. PFTs (8/18): Mild restriction (no IDL on CT chest).  13. Lung nodules: CT chest 8/18 with small bilateral nodules.  14. PAD: ABIs with moderate bilateral disease in 8/18.  - Peripheral angiogram 03/18/19 showed 80% right SFA stenosis and occluded right PT. He had stenting of SFA and PTCA of PT with restoration of flow. However, he still needed amputation right 4th  toe on 03/21/19 with intractable pain.  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  - Peripheral angiogram left leg: DES to left SFA, only 1 vessel runoff via severely diseased peroneal artery, unable to intervene. 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  - Left AKA 11/20.  15. Atrial flutter: In 11/18 associated with nausea/vomiting from gastroparesis.   Current Outpatient Medications  Medication Sig Dispense Refill  . aspirin EC 81 MG tablet Take 1 tablet (81 mg total) by mouth daily. 90 tablet 3  . atorvastatin (LIPITOR) 40 MG tablet Take 1 tablet (40 mg total) by mouth daily at 6 PM. 90 tablet 3  . calcitRIOL (ROCALTROL) 0.5 MCG capsule Take 1 capsule (0.5 mcg total) by mouth every Monday, Wednesday, and Friday. 15 capsule 5  . citalopram (CELEXA) 20 MG tablet Take 1 tablet (20 mg total) by mouth daily. 30 tablet 6  . gabapentin (NEURONTIN) 300 MG capsule Take 1 capsule (300 mg total) by mouth 2 (two) times daily. (Patient taking differently: Take 300 mg by mouth 2 (two) times daily as needed. ) 60 capsule 2  . insulin glargine (LANTUS) 100 UNIT/ML injection Inject 0.1 mLs (10 Units total) into the skin at bedtime. (Patient taking differently: Inject 20 Units into the skin at bedtime. ) 10 mL 3  . LORazepam  (ATIVAN) 0.5 MG tablet Take 1 tablet (0.5 mg total) by mouth every 12 (twelve) hours as needed (nausea). 60 tablet 0  . metoCLOPramide (REGLAN) 10 MG tablet Take 1 tablet (10 mg total) by mouth 3 (three) times daily. (Patient taking differently: Take 10 mg by mouth in the morning and at bedtime. ) 90 tablet 6  . [START ON 04/29/2020] midodrine (PROAMATINE) 5 MG tablet Take 2 tablets (10 mg total) by mouth every Monday, Wednesday, and Friday. Take 1 tablet before dialysis treatment 60 tablet 6  . multivitamin (RENA-VIT) TABS tablet Take 1 tablet by mouth daily.    [name] oxyCODONE (ROXICODONE) 5 MG immediate release tablet Take 1 tablet (5 mg total) by mouth 2 (two) times daily as needed for severe pain. Take 1-2 tablets every 4-6 hours for severe pain. 30 tablet 0  . pantoprazole (PROTONIX) 40 MG tablet Take 1 tablet (40 mg total) by mouth 2 (two) times daily before lunch and supper. 60 tablet 11  . promethazine (PHENERGAN) 12.5 MG tablet Take 1 tablet (12.5 mg total) by mouth every 6 (six) hours as needed for nausea or vomiting. 30 tablet 3  . sevelamer carbonate (RENVELA) 0.8 g PACK packet Take 0.8 g by mouth 3 (three) times daily with meals. 270 each 1  . warfarin (COUMADIN) 5 MG tablet Take 1 tablet (5 mg total) by mouth daily. 30 tablet 11  . dronabinol (MARINOL) 2.5 MG capsule Take 1 capsule (2.5 mg total) by mouth 2 (two) times daily before lunch and supper. (Patient not taking: Reported on 02/25/2020) 60 capsule 5  . oxyCODONE-acetaminophen (PERCOCET) 5-325 MG tablet Take 1 tablet by mouth every 6 (six) hours as needed for severe pain. (Patient not taking: Reported on 04/28/2020) 10 tablet 0   No current facility-administered medications for this encounter.    Metformin and related   Review of systems complete and found to be negative unless listed in HPI.    BP (!) 121/41 Comment: 70  Pulse 83   SpO2 98%   MAP 70  VAD interrogation & Equipment Management: See LVAD nurse's note above.    General: Well appearing this am. NAD.  HEENT: Normal. Neck: Supple, JVP 7-8 cm.  Carotids OK.  Cardiac:  Mechanical heart sounds with LVAD hum present.  Lungs:  CTAB, normal effort.  Abdomen:  NT, ND, no HSM. No bruits or masses. +BS  LVAD exit site: Well-healed and incorporated. Dressing dry and intact. No erythema or drainage. Stabilization device present and accurately applied. Driveline dressing changed daily per sterile technique. Extremities:  S/p bilateral AKAs Neuro:  Alert & oriented x 3. Cranial nerves grossly intact. Moves all 4 extremities w/o difficulty. Affect pleasant    ASSESSMENT AND PLAN: 1. PAD/ischemic feet:  He is now s/p bilateral AKAs.  Coping reasonably well, able to transfer from bed to wheelchair on his own.  2. Chronic systolic CHF: Ischemic cardiomyopathy. St Jude ICD. NSTEMI in March 2018 with DES to LAD and RCA, complicated by cardiogenic shock, low output requiring milrinone. Echo 8/18 with EF 20-25%, moderate MR. CPX (8/18) with severe functional impairment due to HF. He is s/p HM3 LVAD placement. He had pump thrombosis 6/19 due to [facility] outflow graft kinking. He had pump exchange for another HM3.   - Volume managed at dialysis, MWF.   - Continue ASA 81and warfarin with INR goal 2-2.5.    - He had been referred to [facility] for evaluation for heart/kidney transplant, but he is not candidate for transplantnowgiven extensive PAD. - Low flows at HD, agree that his dry weight may be set too low and would recommend adjusting it up (think he has gained non-fluid weight).  I will also increase pre-HD midodrine to 10 mg.  3. ESRD: HD on M-W-F with midodrine pre-HD.  4.CAD: NSTEMI in 3/18. LHC with 99% ulcerated lesion proximal RCA with left to right collaterals, 95% mid LAD stenosis after mid LAD stent. s/p PCI to RCA and LAD on 11/25/16.No chest pain.  - Continue statin and ASA. 5. h/o DVTs: Factor V Leiden heterozygote. -Anticoagulated. 6. Diabetic  gastroparesis: He has seen gastroparesis specialist at [facility]. Surprisingly, gastric emptying study was normal. However, electrogastrogram showed poor gastric accomodation/capacity. Therefore, small frequent meals recommended. - Continue reglan 10 mg tid/ac, now off Marinol.  - Continue Protonix bid.  - Limit narcotic use as much as possible.  7. HTN:MAP generally controlled.  8. Chronic pain: Now seen in a pain clinic.   Followup in 2 months.   Avyay Coger 04/28/2020

## 2020-04-28 NOTE — Progress Notes (Signed)
  Echocardiogram 2D Echocardiogram has been performed.  [name] G [name] 04/28/2020, 10:04 AM

## 2020-04-28 NOTE — Progress Notes (Signed)
LVAD INR 

## 2020-05-05 ENCOUNTER — Ambulatory Visit (HOSPITAL_COMMUNITY): Payer: Self-pay | Admitting: Pharmacist

## 2020-05-05 LAB — POCT INR: INR: 2.7 (ref 2.0–3.0)

## 2020-05-05 NOTE — Progress Notes (Signed)
LVAD INR 

## 2020-05-12 ENCOUNTER — Ambulatory Visit (HOSPITAL_COMMUNITY): Payer: Self-pay | Admitting: Pharmacist

## 2020-05-12 LAB — POCT INR: INR: 2.4 (ref 2.0–3.0)

## 2020-05-12 NOTE — Progress Notes (Signed)
LVAD INR 

## 2020-05-19 LAB — POCT INR: INR: 2.2 (ref 2.0–3.0)

## 2020-05-20 ENCOUNTER — Other Ambulatory Visit: Payer: Self-pay | Admitting: Student in an Organized Health Care Education/Training Program

## 2020-05-20 ENCOUNTER — Ambulatory Visit (HOSPITAL_COMMUNITY): Payer: Self-pay | Admitting: Pharmacist

## 2020-05-20 DIAGNOSIS — E118 Type 2 diabetes mellitus with unspecified complications: Secondary | ICD-10-CM

## 2020-05-20 DIAGNOSIS — Z794 Long term (current) use of insulin: Secondary | ICD-10-CM

## 2020-05-20 NOTE — Progress Notes (Signed)
LVAD INR 

## 2020-05-20 NOTE — Telephone Encounter (Signed)
Patient insulin refilled. He will need an appointment for Hgb A1c and other labwork check prior to further refills.

## 2020-05-21 NOTE — Telephone Encounter (Signed)
Appointment made per PCP.  [name], [place]

## 2020-05-26 ENCOUNTER — Ambulatory Visit (HOSPITAL_COMMUNITY): Payer: Self-pay | Admitting: Pharmacist

## 2020-05-26 LAB — POCT INR: INR: 2.1 (ref 2.0–3.0)

## 2020-05-26 NOTE — Progress Notes (Signed)
LVAD INR 

## 2020-06-02 ENCOUNTER — Ambulatory Visit (HOSPITAL_COMMUNITY): Payer: Self-pay | Admitting: Pharmacist

## 2020-06-02 ENCOUNTER — Ambulatory Visit: Payer: BC Managed Care – PPO | Admitting: Student in an Organized Health Care Education/Training Program

## 2020-06-02 LAB — POCT INR: INR: 2.6 (ref 2.0–3.0)

## 2020-06-02 NOTE — Progress Notes (Signed)
LVAD INR 

## 2020-06-09 ENCOUNTER — Ambulatory Visit (HOSPITAL_COMMUNITY): Payer: Self-pay | Admitting: Pharmacist

## 2020-06-09 LAB — POCT INR: INR: 2.1 (ref 2.0–3.0)

## 2020-06-09 NOTE — Progress Notes (Signed)
LVAD INR 

## 2020-06-16 ENCOUNTER — Ambulatory Visit (HOSPITAL_COMMUNITY): Payer: Self-pay | Admitting: Pharmacist

## 2020-06-16 LAB — POCT INR: INR: 2.3 (ref 2.0–3.0)

## 2020-06-16 NOTE — Progress Notes (Signed)
LVAD INR 

## 2020-06-19 ENCOUNTER — Other Ambulatory Visit: Payer: Self-pay | Admitting: Student in an Organized Health Care Education/Training Program

## 2020-06-19 DIAGNOSIS — E118 Type 2 diabetes mellitus with unspecified complications: Secondary | ICD-10-CM

## 2020-06-19 DIAGNOSIS — Z794 Long term (current) use of insulin: Secondary | ICD-10-CM

## 2020-06-23 ENCOUNTER — Ambulatory Visit (HOSPITAL_COMMUNITY): Payer: Self-pay | Admitting: Pharmacist

## 2020-06-23 LAB — POCT INR: INR: 2.6 (ref 2.0–3.0)

## 2020-06-23 NOTE — Progress Notes (Signed)
LVAD INR 

## 2020-06-30 ENCOUNTER — Ambulatory Visit (HOSPITAL_COMMUNITY)
Admission: RE | Admit: 2020-06-30 | Discharge: 2020-06-30 | Disposition: A | Discharge status: Home / Self Care | Payer: Medicare Other | Source: Ambulatory Visit | Attending: Cardiology | Admitting: Cardiology

## 2020-06-30 ENCOUNTER — Ambulatory Visit (HOSPITAL_COMMUNITY): Payer: Self-pay | Admitting: Pharmacist

## 2020-06-30 ENCOUNTER — Other Ambulatory Visit: Payer: Self-pay

## 2020-06-30 VITALS — BP 106/0 | HR 88

## 2020-06-30 DIAGNOSIS — I252 Old myocardial infarction: Secondary | ICD-10-CM | POA: Insufficient documentation

## 2020-06-30 DIAGNOSIS — N186 End stage renal disease: Secondary | ICD-10-CM | POA: Diagnosis not present

## 2020-06-30 DIAGNOSIS — Z89611 Acquired absence of right leg above knee: Secondary | ICD-10-CM | POA: Diagnosis not present

## 2020-06-30 DIAGNOSIS — Z955 Presence of coronary angioplasty implant and graft: Secondary | ICD-10-CM | POA: Insufficient documentation

## 2020-06-30 DIAGNOSIS — I132 Hypertensive heart and chronic kidney disease with heart failure and with stage 5 chronic kidney disease, or end stage renal disease: Secondary | ICD-10-CM | POA: Diagnosis not present

## 2020-06-30 DIAGNOSIS — I255 Ischemic cardiomyopathy: Secondary | ICD-10-CM | POA: Insufficient documentation

## 2020-06-30 DIAGNOSIS — K3184 Gastroparesis: Secondary | ICD-10-CM

## 2020-06-30 DIAGNOSIS — Z993 Dependence on wheelchair: Secondary | ICD-10-CM | POA: Diagnosis not present

## 2020-06-30 DIAGNOSIS — Z79899 Other long term (current) drug therapy: Secondary | ICD-10-CM | POA: Diagnosis not present

## 2020-06-30 DIAGNOSIS — Z7982 Long term (current) use of aspirin: Secondary | ICD-10-CM | POA: Diagnosis not present

## 2020-06-30 DIAGNOSIS — G8918 Other acute postprocedural pain: Secondary | ICD-10-CM | POA: Diagnosis not present

## 2020-06-30 DIAGNOSIS — D6851 Activated protein C resistance: Secondary | ICD-10-CM | POA: Diagnosis not present

## 2020-06-30 DIAGNOSIS — Z7901 Long term (current) use of anticoagulants: Secondary | ICD-10-CM | POA: Insufficient documentation

## 2020-06-30 DIAGNOSIS — Z4801 Encounter for change or removal of surgical wound dressing: Secondary | ICD-10-CM | POA: Insufficient documentation

## 2020-06-30 DIAGNOSIS — Z89612 Acquired absence of left leg above knee: Secondary | ICD-10-CM | POA: Diagnosis not present

## 2020-06-30 DIAGNOSIS — I5022 Chronic systolic (congestive) heart failure: Secondary | ICD-10-CM | POA: Diagnosis not present

## 2020-06-30 DIAGNOSIS — Z794 Long term (current) use of insulin: Secondary | ICD-10-CM | POA: Diagnosis not present

## 2020-06-30 DIAGNOSIS — Z95811 Presence of heart assist device: Secondary | ICD-10-CM | POA: Diagnosis not present

## 2020-06-30 DIAGNOSIS — I2510 Atherosclerotic heart disease of native coronary artery without angina pectoris: Secondary | ICD-10-CM | POA: Insufficient documentation

## 2020-06-30 DIAGNOSIS — Z992 Dependence on renal dialysis: Secondary | ICD-10-CM | POA: Diagnosis not present

## 2020-06-30 DIAGNOSIS — E1143 Type 2 diabetes mellitus with diabetic autonomic (poly)neuropathy: Secondary | ICD-10-CM | POA: Insufficient documentation

## 2020-06-30 DIAGNOSIS — E1122 Type 2 diabetes mellitus with diabetic chronic kidney disease: Secondary | ICD-10-CM | POA: Insufficient documentation

## 2020-06-30 LAB — POCT INR: INR: 2.1 (ref 2.0–3.0)

## 2020-06-30 MED ORDER — MIDODRINE HCL 5 MG PO TABS
15.0000 mg | ORAL_TABLET | ORAL | 6 refills | Status: DC
Start: 2020-07-01 — End: 2021-06-01

## 2020-06-30 MED ORDER — WARFARIN SODIUM 5 MG PO TABS: 5.0000 mg | ORAL_TABLET | Freq: Every day | ORAL | 11 refills | Status: DC

## 2020-06-30 NOTE — Progress Notes (Addendum)
Patient presents for 2 month follow up visit follow up in [facility] today with his son. Reports no problems with VAD equipment or concerns with drive line.   Pt states that his BP has been low during dialysis recently and that the last few times they have stopped pulling about 1 hr and half into his treatment for low BP. Pt states that he feels fine during this and denies dizziness or weakness. VAD coordinator spoke with Joleen at the dialysis center today and informed her that we increased his Midodrine today to 15 mg prior to dialysis and that his systolic goal should be 70 systolic or greater.   Pt also c/o pain around his driveline site-see driveline care note below.  Vital Signs:  Doppler Pressure  106 Automatc BP: 121/41 (70) HR:  88 SPO2: 99  Weight: UTO lb w/o eqt Last weight: 200 lb   VAD Indication: Destination therapy- Pt is no longer candidate for heart/kidney transplant due to PAD per [facility].   VAD interrogation & Equipment Management: Speed: 5500 Flow: 3.9 Power: 4.6w    PI: 5.5  Alarms: none Events:80+ yesterday at dialysis 4 today  Fixed speed 5500 Low speed limit: 5200  Primary Controller: Replace back up battery in 32 months. Back up controller:   Replace back up battery in 10 months  Annual Equipment Maintenance on UBC/PM was performed on 02/2020.   I reviewed the LVAD parameters from today and compared the results to the patient's prior recorded data. LVAD interrogation was NEGATIVE for significant power changes, NEGATIVE for clinical alarms and STABLE for PI events/speed drops. No programming changes were made and pump is functioning within specified parameters. Pt is performing daily controller and system monitor self tests along with completing weekly and monthly maintenance for LVAD equipment.  LVAD equipment check completed and is in good working order. Back-up equipment present.  Exit Site Care: Drive line is being maintained weekly by  daughter. CDI today. Existing VAD dressing removed and site care performed using sterile technique. Drive line exit site cleaned with Chlora prep applicators x 2, allowed to dry, and Sorbaview dressing withOUT re-applied. Exit site healed and incorporated, the velour is fully implanted at exit site. Pt has slight tenderness at the insertion site of driveline - it appears this may be caused from the biopatch. Pt states that the nurses had to stop using a biopatch around his dialysis catheter recently. No redness, drainage, foul odor or rash noted. Drive line anchor re-applied. Pt verbalized understanding of same. Provided patient with 8 weekly dressing kits for home use.   Significant Events on VAD Support: -10/2016>> poss drive line infection, CT ABD neg, ID consult-doxy -11/09/16>> admit for poss drive line infection, IV abx -03/24/17>> doxy for poss drive line infection -05/04/17>> drive line debridement with wound-vac -06/2017>> drive line +proteus, IV abx, Bactrim x14 days  Device:St jude Dual Therapies: on 200 bpm Last check: 01/01/18 - 2 hrs Afl/Afib; EMI 04/02/18  BP & Labs:  Doppler BP 106 - is reflecting modified systolic.  Hgb 12.4; difficult venous access. Result  from dialysis labs 06/17/20 - No S/S of bleeding. Specifically denies melena/BRBPR or nosebleeds.  LDH 220, difficult venous access. Results from dialysis labs 06/17/20 - Denies tea-colored urine. No power elevations noted on interrogation.    Patient Instructions:  1. Increase Midodrine 15 mg on dialysis days 2. Return to clinic in 2 months  [name] RN [place] Coordinator  Office: [phone]  24/7 Pager: [phone]  Cardiology: Dr. Aundra Dubin  HPI: 54 y.o. with history of CAD s/p anterior MI in 2010 and NSTEMI in 3/18 with DES to pRCA and mLAD and ischemic cardiomyopathy now s/p Heartmate 3 LVAD and ESRD presents for followup of CHF/LVAD.  Patient developed low output heart failure.  He was not deemed  to be a good transplant candidate.  He was admitted in 10/18 and Heartmate 3 LVAD was placed.  He had baseline CKD stage 3 likely from diabetic nephropathy and CHF.  He developed peri-op AKI and ended up on dialysis.  No renal recovery to date.  St Jude ICD has been turned back on and ramp echo was done prior to discharge in 10/18, speed increased to 5600 rpm.   He was admitted in 11/18 with nausea/vomiting and abdominal pain.  This appears to have been due to diabetic gastroparesis (extensive GI evaluation). He had atrial flutter this admission that was converted to NSR with amiodarone.  He improved considerably with scheduled Reglan and erythromycin. He was re-admitted later in 11/18 with the same symptoms, suspect diabetic gastroparesis and was again treated with erythromycin.  He was not able to start domperidone due to prolonged QT interval. He had nausea again about a week ago but was able to manage at home with Phenergan suppositories.   Admitted 1/8 -> 09/16/17 for uncontrolled vomiting in setting of diabetic gastroparesis. Improved with phenergan suppositories and given erythromycin TID for 7 days post-discharge.    He was admitted in 2/19 for AV graft placement.  While in the hospital, we turned down his speed.  Since then, he has had considerably fewer PI events on HD days.    He was admitted in 4/19 with nausea/vomiting again, probably gastroparesis flare.   He was admitted in 5/19 with nausea/vomiting, likely gastroparesis flare, treated with erythromycin course which he is finishing.   He has been evaluated at [facility] by Dr. Derrill Kay (gastroparesis specialist).   Gastric emptying study, surprisingly, was near normal. However, electrogastrogram showed poor gastric accomodation/capacity, "bradygastria."  Frequent small meals were recommended. Also, early am nausea was thought to be possible GERD.    Admitted 6/26 - 03/19/18 for emergent VAD exchange 03/01/18 in the setting of HM3 outflow graft  kink. He required CVVHD post op, but was transtioned back to [facility] without problems. Course complicated by leukocytosis and fever. CT abd/chest negative for infection and blood cultures were negative. Post op pain well controlled withraretramadol. Able to tolerate diet. Evaluated by PT/OT and HH PT recommended for discharge. Ramp echo completed prior to discharge with speed optimization. Resumed81 mg ASA + coumadin with INR goal 2-3.  He was admitted 03/23/18 with nausea/gastroparesis symptoms and marked HTN at outpatient HD.  Erythromycin was restarted and gastroparesis symptoms were controlled with fall in BP and discharge on 03/25/18.   He was admitted in 9/19 for LUE AV fistula placement.  He was readmitted in 10/19 with his typical gastroparesis symptoms, unable to take po. The fistula later failed and was replaced with a graft.  However, the graft was nonfunctional and developed a hematoma at the surgical site.    He was admitted in 12/19 with nausea/vomiting from HD. Also with LDH increased to 427 in setting of extensive hematoma around his left arm AV graft.  He has had tingling in his left hand and left grip is weak.    In 7/20, he was admitted with ischemic 4th toe on right foot.  Peripheral angiogram 03/18/19 showed 80% right SFA stenosis and  occluded right PT. He had stenting of SFA and PTCA of PT with restoration of flow. However, he still needed amputation right 4th toe on 7/16 with intractable pain.  He was readmitted with pain in his right foot, nausea, and vomiting. There was concern for ongoing right foot ischemia with all toes and a portion of the foot now dusky.  He was treated with IV vancomycin for possible osteomyelitis/infection, but thought that more likely ischemia could explain all the findings.  He was discharged home to followup with vascular surgery.   He was re-admitted in 8/20 with ongoing right foot pain and ended up having transmetatarsal amputation of the right foot.   Left leg angiogram was done with DES to left SFA, only 1 vessel runoff via severely diseased peroneal artery, unable to intervene.   He was readmitted in 9/20 with ongoing right foot/lower leg pain and had right AKA.  He was [facility] to [facility]    He was then admitted with gangrene in the remnant of his left foot in 11/20.  He had left AKA.    He returns today for followup of LVAD.  He transfers to and from his wheelchair and is able to wheel his chair without dyspnea.  Lately, he has not been getting full ultrafiltration at HD.  He says that UF is being stopped when MAP drops below 80.  Generally, he does not get lightheaded until MAP drops < 70.    Reports taking Coumadin as prescribed and adherence to anticoagulation based dietary restrictions. Denies BRBPR or melena. No dark urine or hematuria.   Labs (11/18): hgb 10.4 => 8.7, LDH 213 Labs (08/04/17): hgb 9 Labs (1/19): HgbA1c 6.1 Labs (2/19): hgb 13.2 Labs (3/19): hgb 11.1 Labs (7/19): hgb 9.6 Labs (10/19: hgb 11.8 Labs (12/19): LDH 427 => 237, hgb 7.7 Labs (11/20): hgb 8.1 Labs (1/21): HIV negative, RPR negative Labs (3/21): hgb 8.8 Labs (6/21): hgb 12.4 Labs (8/21): hgb 10.8, LDH 201 Labs (10/21): hgb 12.4, LDH 220  PMH: 1. Chronic systolic CHF: Ischemic cardiomyopathy. St Jude ICD.  - Echo (11/17) with EF 25-30%, moderate diastolic dysfunction, mild MR, mildly dilated RV with moderately decreased systolic function, peak RV-RA gradient 48 mmHg.  - Echo (3/18) with EF 25-30%, wall motion abnormalities, normal RV size and systolic function.  - Admission 3/18 with cardiogenic shock and AKI, required milrinone gtt.  - Echo (8/18) with EF 20-25%, mild LVH, moderate LV dilation, mild MR.  - CPX (8/18) with peak VO2 12.4, VE/VCO2 slope 35, RER 1.05 => severe HR limitation.  - Heartmate 3 LVAD 10/18.  - Pump thrombosis in setting of HM3 outflow graft kink.  Pump exchange 6/19 for HM3.  2. CAD: Anterior MI 2005, had  bifurcation stenting LAD/diagonal. Repeat cath 2010 with patent stents.  - NSTEMI 3/18: LHC with 99% ulcerated lesion proximal RCA, 95% mid LAD => PCI with DES to proximal RCA and mid LAD.  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  - Lower extremity venous dopplers (8/18): chronic DVT - V/Q scan (8/18): Normal, no PE.  4. Crohns disease: History of colectomy.  5. GERD 6. Diabetic gastroparesis: Admission 11/18 with abdominal pain, nausea/vomiting.  - [facility] workup 2019: Gastric emptying study near normal.  Negative breath test (no evidence for small bowel bacterial overgrowth).  Electrogastrogram showed poor gastric accomodation/capacity, "bradygastria."  7. HTN 8. Hyperlipidemia 9. Type II diabetes  10. ESRD: Post-op LVAD surgery.  He has had failed AV fistula and AV graft, now dialyzing via  catheter . 11. Carotid dopplers (8/18): mild bilateral stenosis.  12. PFTs (8/18): Mild restriction (no IDL on CT chest).  13. Lung nodules: CT chest 8/18 with small bilateral nodules.  14. PAD: ABIs with moderate bilateral disease in 8/18.  - Peripheral angiogram 03/18/19 showed 80% right SFA stenosis and occluded right PT. He had stenting of SFA and PTCA of PT with restoration of flow. However, he still needed amputation right 4th toe on 03/21/19 with intractable pain.  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  - Peripheral angiogram left leg: DES to left SFA, only 1 vessel runoff via severely diseased peroneal artery, unable to intervene. 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  - Left AKA 11/20.  15. Atrial flutter: In 11/18 associated with nausea/vomiting from gastroparesis.   Current Outpatient Medications  Medication Sig Dispense Refill  . aspirin EC 81 MG tablet Take 1 tablet (81 mg total) by mouth daily. 90 tablet 3  . atorvastatin (LIPITOR) 40 MG tablet Take 1 tablet (40 mg total) by mouth daily at 6 PM. 90 tablet 3  . calcitRIOL (ROCALTROL) 0.5 MCG capsule Take 1 capsule (0.5 mcg total) by mouth every Monday, Wednesday,  and Friday. 15 capsule 5  . citalopram (CELEXA) 20 MG tablet Take 1 tablet (20 mg total) by mouth daily. 30 tablet 6  . LANTUS 100 UNIT/ML injection INJECT 0.2 MLS (20 UNITS TOTAL) INTO THE SKIN AT BEDTIME. 10 mL 0  . LORazepam (ATIVAN) 0.5 MG tablet Take 1 tablet (0.5 mg total) by mouth every 12 (twelve) hours as needed (nausea). 60 tablet 0  . metoCLOPramide (REGLAN) 10 MG tablet Take 1 tablet (10 mg total) by mouth 3 (three) times daily. (Patient taking differently: Take 10 mg by mouth 3 (three) times daily before meals. ) 90 tablet 6  . [START ON 07/01/2020] midodrine (PROAMATINE) 5 MG tablet Take 3 tablets (15 mg total) by mouth every Monday, Wednesday, and Friday. Take 1 tablet before dialysis treatment 60 tablet 6  . multivitamin (RENA-VIT) TABS tablet Take 1 tablet by mouth daily.    [name] oxyCODONE (ROXICODONE) 5 MG immediate release tablet Take 1 tablet (5 mg total) by mouth 2 (two) times daily as needed for severe pain. Take 1-2 tablets every 4-6 hours for severe pain. 30 tablet 0  . oxyCODONE-acetaminophen (PERCOCET) 5-325 MG tablet Take 1 tablet by mouth every 6 (six) hours as needed for severe pain. 10 tablet 0  . pantoprazole (PROTONIX) 40 MG tablet Take 1 tablet (40 mg total) by mouth 2 (two) times daily before lunch and supper. 60 tablet 11  . promethazine (PHENERGAN) 12.5 MG tablet Take 1 tablet (12.5 mg total) by mouth every 6 (six) hours as needed for nausea or vomiting. 30 tablet 3  . sevelamer carbonate (RENVELA) 0.8 g PACK packet Take 0.8 g by mouth 3 (three) times daily with meals. 270 each 1  . warfarin (COUMADIN) 5 MG tablet Take 1 tablet (5 mg total) by mouth daily. 30 tablet 11  . gabapentin (NEURONTIN) 300 MG capsule Take 1 capsule (300 mg total) by mouth 2 (two) times daily. (Patient not taking: Reported on 06/30/2020) 60 capsule 2   No current facility-administered medications for this encounter.    Metformin and related   Review of systems complete and found to be  negative unless listed in HPI.    BP (!) 106/0   Pulse 88   SpO2 99%   MAP 95  VAD interrogation & Equipment Management: See LVAD nurse's note above.   General:  Well appearing this am. NAD.  HEENT: Normal. Neck: Supple, JVP 7-8 cm. Carotids OK.  Cardiac:  Mechanical heart sounds with LVAD hum present.  Lungs:  CTAB, normal effort.  Abdomen:  NT, ND, no HSM. No bruits or masses. +BS  LVAD exit site: Well-healed and incorporated. Dressing dry and intact. No erythema or drainage. Stabilization device present and accurately applied. Driveline dressing changed daily per sterile technique. Extremities:  Bilateral AKAs  Neuro:  Alert & oriented x 3. Cranial nerves grossly intact. Moves all 4 extremities w/o difficulty. Affect pleasant     ASSESSMENT AND PLAN: 1. PAD/ischemic feet:  He is now s/p bilateral AKAs.  Coping reasonably well, able to transfer from bed to wheelchair on his own.  2. Chronic systolic CHF: Ischemic cardiomyopathy. St Jude ICD. NSTEMI in March 2018 with DES to LAD and RCA, complicated by cardiogenic shock, low output requiring milrinone. Echo 8/18 with EF 20-25%, moderate MR. CPX (8/18) with severe functional impairment due to HF. He is s/p HM3 LVAD placement. He had pump thrombosis 6/19 due to [facility] outflow graft kinking. He had pump exchange for another HM3.  LVAD parameters stable.  - Volume managed at dialysis, MWF.   - Continue ASA 81and warfarin with INR goal 2-2.5.    - He had been referred to [facility] for evaluation for heart/kidney transplant, but he is not candidate for transplantnowgiven extensive PAD. 3. ESRD: HD on M-W-F with midodrine pre-HD.  - Need to allow UF as long as MAP > 70 (would not stop for MAP 70-80).   - Increase pre-HD midodrine to 15 mg.  4.CAD: NSTEMI in 3/18. LHC with 99% ulcerated lesion proximal RCA with left to right collaterals, 95% mid LAD stenosis after mid LAD stent. s/p PCI to RCA and LAD on 11/25/16.No chest pain.  -  Continue statin and ASA. 5. h/o DVTs: Factor V Leiden heterozygote. -Anticoagulated. 6. Diabetic gastroparesis: He has seen gastroparesis specialist at [facility]. Surprisingly, gastric emptying study was normal. However, electrogastrogram showed poor gastric accomodation/capacity. Therefore, small frequent meals recommended. - Continue reglan 10 mg tid/ac, now off Marinol.  - Continue Protonix bid.  - Limit narcotic use as much as possible.  7. HTN:MAP generally controlled.  8. Chronic pain: Now seen in a pain clinic.   Followup in 2 months.   [name] 06/30/2020

## 2020-06-30 NOTE — Progress Notes (Signed)
LVAD INR 

## 2020-06-30 NOTE — Patient Instructions (Signed)
1. Increase Midodrine 15 mg on dialysis days 2. Return to clinic in 2 months

## 2020-07-07 ENCOUNTER — Ambulatory Visit (HOSPITAL_COMMUNITY): Payer: Self-pay | Admitting: Pharmacist

## 2020-07-07 LAB — POCT INR: INR: 2.8 (ref 2.0–3.0)

## 2020-07-07 NOTE — Progress Notes (Signed)
LVAD INR 

## 2020-07-14 ENCOUNTER — Ambulatory Visit (HOSPITAL_COMMUNITY): Payer: Self-pay | Admitting: Pharmacist

## 2020-07-14 ENCOUNTER — Other Ambulatory Visit: Payer: Self-pay | Admitting: Student in an Organized Health Care Education/Training Program

## 2020-07-14 DIAGNOSIS — Z794 Long term (current) use of insulin: Secondary | ICD-10-CM

## 2020-07-14 DIAGNOSIS — E118 Type 2 diabetes mellitus with unspecified complications: Secondary | ICD-10-CM

## 2020-07-14 LAB — POCT INR: INR: 2.5 (ref 2.0–3.0)

## 2020-07-14 NOTE — Progress Notes (Signed)
LVAD INR 

## 2020-07-20 ENCOUNTER — Other Ambulatory Visit (HOSPITAL_COMMUNITY): Payer: Self-pay | Admitting: Cardiology

## 2020-07-20 DIAGNOSIS — R110 Nausea: Secondary | ICD-10-CM

## 2020-07-21 ENCOUNTER — Ambulatory Visit (HOSPITAL_COMMUNITY): Payer: Self-pay | Admitting: Pharmacist

## 2020-07-21 LAB — POCT INR: INR: 2.2 (ref 2.0–3.0)

## 2020-07-21 NOTE — Progress Notes (Signed)
LVAD INR 

## 2020-07-28 ENCOUNTER — Ambulatory Visit (HOSPITAL_COMMUNITY): Payer: Self-pay | Admitting: Pharmacist

## 2020-07-28 LAB — POCT INR: INR: 2.3 (ref 2.0–3.0)

## 2020-07-28 NOTE — Progress Notes (Signed)
LVAD INR 

## 2020-08-04 ENCOUNTER — Ambulatory Visit (HOSPITAL_COMMUNITY): Payer: Self-pay | Admitting: Pharmacist

## 2020-08-04 ENCOUNTER — Other Ambulatory Visit: Payer: Self-pay | Admitting: Student in an Organized Health Care Education/Training Program

## 2020-08-04 DIAGNOSIS — Z794 Long term (current) use of insulin: Secondary | ICD-10-CM

## 2020-08-04 LAB — POCT INR: INR: 2.3 (ref 2.0–3.0)

## 2020-08-04 NOTE — Progress Notes (Signed)
LVAD INR 

## 2020-08-11 ENCOUNTER — Ambulatory Visit (HOSPITAL_COMMUNITY): Payer: Self-pay | Admitting: Pharmacist

## 2020-08-11 LAB — POCT INR: INR: 2.7 (ref 2.0–3.0)

## 2020-08-11 NOTE — Progress Notes (Signed)
LVAD INR 

## 2020-08-18 ENCOUNTER — Ambulatory Visit (HOSPITAL_COMMUNITY): Payer: Self-pay | Admitting: Pharmacist

## 2020-08-18 ENCOUNTER — Ambulatory Visit (INDEPENDENT_AMBULATORY_CARE_PROVIDER_SITE_OTHER): Payer: Medicare Other

## 2020-08-18 DIAGNOSIS — I255 Ischemic cardiomyopathy: Secondary | ICD-10-CM | POA: Diagnosis not present

## 2020-08-18 LAB — POCT INR: INR: 2.1 (ref 2.0–3.0)

## 2020-08-18 NOTE — Progress Notes (Signed)
LVAD INR 

## 2020-08-20 LAB — CUP PACEART REMOTE DEVICE CHECK
Battery Remaining Longevity: 57 mo
Battery Remaining Percentage: 62 %
Battery Voltage: 2.95 V
Brady Statistic AP VP Percent: 1 %
Brady Statistic AP VS Percent: 1 %
Brady Statistic AS VP Percent: 1 %
Brady Statistic AS VS Percent: 99 %
Brady Statistic RA Percent Paced: 1 %
Brady Statistic RV Percent Paced: 1 %
Date Time Interrogation Session: 20211215143143
HighPow Impedance: 55 Ohm
HighPow Impedance: 55 Ohm
Implantable Lead Implant Date: 20100430
Implantable Lead Implant Date: 20100430
Implantable Lead Location: 753859
Implantable Lead Location: 753860
Implantable Lead Model: 7121
Implantable Pulse Generator Implant Date: 20180110
Lead Channel Impedance Value: 350 Ohm
Lead Channel Impedance Value: 680 Ohm
Lead Channel Pacing Threshold Amplitude: 0.75 V
Lead Channel Pacing Threshold Amplitude: 1 V
Lead Channel Pacing Threshold Pulse Width: 0.5 ms
Lead Channel Pacing Threshold Pulse Width: 0.5 ms
Lead Channel Sensing Intrinsic Amplitude: 1 mV
Lead Channel Sensing Intrinsic Amplitude: 12 mV
Lead Channel Setting Pacing Amplitude: 2.5 V
Lead Channel Setting Pacing Amplitude: 2.5 V
Lead Channel Setting Pacing Pulse Width: 0.5 ms
Lead Channel Setting Sensing Sensitivity: 0.5 mV
Pulse Gen Serial Number: 7383809

## 2020-08-25 ENCOUNTER — Ambulatory Visit (HOSPITAL_COMMUNITY): Payer: Self-pay | Admitting: Pharmacist

## 2020-08-25 LAB — POCT INR: INR: 2.6 (ref 2.0–3.0)

## 2020-08-25 NOTE — Progress Notes (Signed)
LVAD INR 

## 2020-09-01 ENCOUNTER — Ambulatory Visit (HOSPITAL_COMMUNITY): Payer: Self-pay | Admitting: Pharmacist

## 2020-09-01 ENCOUNTER — Encounter (HOSPITAL_COMMUNITY): Payer: BC Managed Care – PPO

## 2020-09-01 LAB — POCT INR: INR: 2.4 (ref 2.0–3.0)

## 2020-09-01 NOTE — Progress Notes (Signed)
LVAD INR 

## 2020-09-02 NOTE — Progress Notes (Signed)
Remote ICD transmission.   

## 2020-09-08 ENCOUNTER — Ambulatory Visit (HOSPITAL_COMMUNITY): Payer: Self-pay | Admitting: Pharmacist

## 2020-09-08 LAB — POCT INR: INR: 2.5 (ref 2.0–3.0)

## 2020-09-08 NOTE — Progress Notes (Signed)
LVAD INR 

## 2020-09-15 ENCOUNTER — Encounter (HOSPITAL_COMMUNITY): Payer: BC Managed Care – PPO

## 2020-09-15 ENCOUNTER — Ambulatory Visit (HOSPITAL_COMMUNITY): Payer: Self-pay | Admitting: Pharmacist

## 2020-09-15 LAB — POCT INR: INR: 2.8 (ref 2.0–3.0)

## 2020-09-15 NOTE — Progress Notes (Signed)
LVAD INR 

## 2020-09-22 ENCOUNTER — Ambulatory Visit (HOSPITAL_COMMUNITY): Payer: Self-pay | Admitting: Pharmacist

## 2020-09-22 LAB — POCT INR: INR: 2.3 (ref 2.0–3.0)

## 2020-09-22 NOTE — Progress Notes (Signed)
LVAD INR 

## 2020-09-24 ENCOUNTER — Other Ambulatory Visit: Payer: Self-pay

## 2020-09-24 ENCOUNTER — Ambulatory Visit (HOSPITAL_COMMUNITY)
Admission: RE | Admit: 2020-09-24 | Discharge: 2020-09-24 | Disposition: A | Discharge status: Home / Self Care | Payer: Medicare Other | Source: Ambulatory Visit | Attending: Cardiology | Admitting: Cardiology

## 2020-09-24 VITALS — BP 106/0 | HR 95

## 2020-09-24 DIAGNOSIS — Z95811 Presence of heart assist device: Secondary | ICD-10-CM | POA: Diagnosis not present

## 2020-09-24 DIAGNOSIS — I110 Hypertensive heart disease with heart failure: Secondary | ICD-10-CM | POA: Diagnosis not present

## 2020-09-24 DIAGNOSIS — I5022 Chronic systolic (congestive) heart failure: Secondary | ICD-10-CM | POA: Diagnosis present

## 2020-09-24 DIAGNOSIS — Z89611 Acquired absence of right leg above knee: Secondary | ICD-10-CM | POA: Insufficient documentation

## 2020-09-24 DIAGNOSIS — Z89612 Acquired absence of left leg above knee: Secondary | ICD-10-CM | POA: Diagnosis not present

## 2020-09-24 NOTE — Progress Notes (Addendum)
[name] presents for 2 month follow up visit follow up in [facility] today with his son. Reports no problems with VAD equipment or concerns with drive line.   Pt states that dialysis is going well. Pt states that he does have low flows during dialysis but he doesn't feel bad.  Pt is asking for new WC. Pt was provided with a script for [facility] in clinic today.  Vital Signs:  Doppler Pressure  106 Automatc BP: 105/86 (93) HR:  95 SPO2: 100 % on RA  Weight: UTO lb w/o eqt Last weight: 200 lb   VAD Indication: Destination therapy- Pt is no longer candidate for heart/kidney transplant due to PAD per [facility].   VAD interrogation & Equipment Management: Speed: 5500 Flow: 3.9 Power: 4.6w    PI: 5.8  Alarms: a few low flows from dialysis yesterday Events:80+ yesterday at dialysis 4 today  Fixed speed 5500 Low speed limit: 5200  Primary Controller: Replace back up battery in 32 months. Back up controller:   Replace back up battery in 10 months  Annual Equipment Maintenance on UBC/PM was performed on 02/2020.   I reviewed the LVAD parameters from today and compared the results to the [name]'s prior recorded data. LVAD interrogation was NEGATIVE for significant power changes, NEGATIVE for clinical alarms and STABLE for PI events/speed drops. No programming changes were made and pump is functioning within specified parameters. Pt is performing daily controller and system monitor self tests along with completing weekly and monthly maintenance for LVAD equipment.  LVAD equipment check completed and is in good working order. Back-up equipment present.  Exit Site Care: Drive line is being maintained weekly by daughter. CDI today. Drive line anchor placed using alternate anchor. Pt verbalized understanding of same. Provided [name] with 8 weekly dressing kits for home use.  Significant Events on VAD Support: -10/2016>> poss drive line infection, CT ABD neg, ID consult-doxy -11/09/16>> admit  for poss drive line infection, IV abx -03/24/17>> doxy for poss drive line infection -05/04/17>> drive line debridement with wound-vac -06/2017>> drive line +proteus, IV abx, Bactrim x14 days  Device:St jude Dual Therapies: on 200 bpm Last check: 01/01/18 - 2 hrs Afl/Afib; EMI 04/02/18  BP & Labs:  Doppler BP 106 - is reflecting modified systolic.  Hgb 12 difficult venous access. Result  from dialysis labs 09/17/19 - No S/S of bleeding. Specifically denies melena/BRBPR or nosebleeds.  LDH 228, difficult venous access. Results from dialysis labs 09/17/19 - Denies tea-colored urine. No power elevations noted on interrogation.   2.5 year Intermacs follow up completed including:  Quality of Life, KCCQ-12, and Neurocognitive trail making.   Pt unable to complete 6 minute walk he is s/p bilateral AKA.  Back up controller:  11V backup battery charged during this visit.  [facility] Cardiomyopathy Questionnaire  KCCQ-12 09/24/2020 02/25/2020  1 a. Ability to shower/bathe Not at all limited Other, Did not do  1 b. Ability to walk 1 block Other, Did not do Other, Did not do  1 c. Ability to hurry/jog Other, Did not do Other, Did not do  2. Edema feet/ankles/legs Never over the past 2 weeks -  3. Limited by fatigue Never over the past 2 weeks Never over the past 2 weeks  4. Limited by dyspnea Never over the past 2 weeks Several times a day  5. Sitting up / on 3+ pillows Never over the past 2 weeks Never over the past 2 weeks  6. Limited enjoyment of life Not limited  at all Not limited at all  7. Rest of life w/ symptoms Completely satisfied Mostly satisfied  8 a. Participation in hobbies - Slightly limited  8 b. Participation in chores N/A, did not do for other reasons Slightly limited  8 c. Visiting family/friends N/A, did not do for other reasons Slightly limited     [name] Instructions:  1. No changes in medications. 2. Return to clinic in 2 months  [name] RN [facility] Coordinator   Office: [phone]  24/7 Pager: [phone]    Cardiology: Dr. Aundra Dubin  HPI: 55 y.o. with history of CAD s/p anterior MI in 2010 and NSTEMI in 3/18 with DES to pRCA and mLAD and ischemic cardiomyopathy now s/p Heartmate 3 LVAD and ESRD presents for followup of CHF/LVAD.  [name] developed low output heart failure.  He was not deemed to be a good transplant candidate.  He was admitted in 10/18 and Heartmate 3 LVAD was placed.  He had baseline CKD stage 3 likely from diabetic nephropathy and CHF.  He developed peri-op AKI and ended up on dialysis.  No renal recovery to date.  St Jude ICD has been turned back on and ramp echo was done prior to discharge in 10/18, speed increased to 5600 rpm.   He was admitted in 11/18 with nausea/vomiting and abdominal pain.  This appears to have been due to diabetic gastroparesis (extensive GI evaluation). He had atrial flutter this admission that was converted to NSR with amiodarone.  He improved considerably with scheduled Reglan and erythromycin. He was re-admitted later in 11/18 with the same symptoms, suspect diabetic gastroparesis and was again treated with erythromycin.  He was not able to start domperidone due to prolonged QT interval. He had nausea again about a week ago but was able to manage at home with Phenergan suppositories.   Admitted 1/8 -> 09/16/17 for uncontrolled vomiting in setting of diabetic gastroparesis. Improved with phenergan suppositories and given erythromycin TID for 7 days post-discharge.    He was admitted in 2/19 for AV graft placement.  While in the hospital, we turned down his speed.  Since then, he has had considerably fewer PI events on HD days.    He was admitted in 4/19 with nausea/vomiting again, probably gastroparesis flare.   He was admitted in 5/19 with nausea/vomiting, likely gastroparesis flare, treated with erythromycin course which he is finishing.   He has been evaluated at [facility] by Dr. Derrill Kay (gastroparesis  specialist).   Gastric emptying study, surprisingly, was near normal. However, electrogastrogram showed poor gastric accomodation/capacity, "bradygastria."  Frequent small meals were recommended. Also, early am nausea was thought to be possible GERD.    Admitted 6/26 - 03/19/18 for emergent VAD exchange 03/01/18 in the setting of HM3 outflow graft kink. He required CVVHD post op, but was transtioned back to [facility] without problems. Course complicated by leukocytosis and fever. CT abd/chest negative for infection and blood cultures were negative. Post op pain well controlled withraretramadol. Able to tolerate diet. Evaluated by PT/OT and HH PT recommended for discharge. Ramp echo completed prior to discharge with speed optimization. Resumed81 mg ASA + coumadin with INR goal 2-3.  He was admitted 03/23/18 with nausea/gastroparesis symptoms and marked HTN at outpatient HD.  Erythromycin was restarted and gastroparesis symptoms were controlled with fall in BP and discharge on 03/25/18.   He was admitted in 9/19 for LUE AV fistula placement.  He was readmitted in 10/19 with his typical gastroparesis symptoms, unable to take po. The fistula  later failed and was replaced with a graft.  However, the graft was nonfunctional and developed a hematoma at the surgical site.    He was admitted in 12/19 with nausea/vomiting from HD. Also with LDH increased to 427 in setting of extensive hematoma around his left arm AV graft.  He has had tingling in his left hand and left grip is weak.    In 7/20, he was admitted with ischemic 4th toe on right foot.  Peripheral angiogram 03/18/19 showed 80% right SFA stenosis and occluded right PT. He had stenting of SFA and PTCA of PT with restoration of flow. However, he still needed amputation right 4th toe on 7/16 with intractable pain.  He was readmitted with pain in his right foot, nausea, and vomiting. There was concern for ongoing right foot ischemia with all toes and a portion of  the foot now dusky.  He was treated with IV vancomycin for possible osteomyelitis/infection, but thought that more likely ischemia could explain all the findings.  He was discharged home to followup with vascular surgery.   He was re-admitted in 8/20 with ongoing right foot pain and ended up having transmetatarsal amputation of the right foot.  Left leg angiogram was done with DES to left SFA, only 1 vessel runoff via severely diseased peroneal artery, unable to intervene.   He was readmitted in 9/20 with ongoing right foot/lower leg pain and had right AKA.  He was discharged to [facility].    He was then admitted with gangrene in the remnant of his left foot in 11/20.  He had left AKA.    He returns today for followup of LVAD.  He has been stable recently. No episodes of abdominal pain/vomiting (used to have a lot of trouble with gastroparesis).  Doing ok at dialysis, has occasional low flows during HD.  He wheels his chair around without dyspnea.  He is able to transfer in and out of chair without dyspnea. He still takes midodrine prior to HD sessions.   Reports taking Coumadin as prescribed and adherence to anticoagulation based dietary restrictions. Denies BRBPR or melena. No dark urine or hematuria.   Labs (11/18): hgb 10.4 => 8.7, LDH 213 Labs (08/04/17): hgb 9 Labs (1/19): HgbA1c 6.1 Labs (2/19): hgb 13.2 Labs (3/19): hgb 11.1 Labs (7/19): hgb 9.6 Labs (10/19: hgb 11.8 Labs (12/19): LDH 427 => 237, hgb 7.7 Labs (11/20): hgb 8.1 Labs (1/21): HIV negative, RPR negative Labs (3/21): hgb 8.8 Labs (6/21): hgb 12.4 Labs (8/21): hgb 10.8, LDH 201 Labs (10/21): hgb 12.4, LDH 220 Labs (12/21): hgb 12, LDH 228  PMH: 1. Chronic systolic CHF: Ischemic cardiomyopathy. St Jude ICD.  - Echo (11/17) with EF 25-30%, moderate diastolic dysfunction, mild MR, mildly dilated RV with moderately decreased systolic function, peak RV-RA gradient 48 mmHg.  - Echo (3/18) with EF 25-30%, wall motion  abnormalities, normal RV size and systolic function.  - Admission 3/18 with cardiogenic shock and AKI, required milrinone gtt.  - Echo (8/18) with EF 20-25%, mild LVH, moderate LV dilation, mild MR.  - CPX (8/18) with peak VO2 12.4, VE/VCO2 slope 35, RER 1.05 => severe HR limitation.  - Heartmate 3 LVAD 10/18.  - Pump thrombosis in setting of HM3 outflow graft kink.  Pump exchange 6/19 for HM3.  2. CAD: Anterior MI 2005, had bifurcation stenting LAD/diagonal. Repeat cath 2010 with patent stents.  - NSTEMI 3/18: LHC with 99% ulcerated lesion proximal RCA, 95% mid LAD => PCI with DES to  proximal RCA and mid LAD.  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  - Lower extremity venous dopplers (8/18): chronic DVT - V/Q scan (8/18): Normal, no PE.  4. Crohns disease: History of colectomy.  5. GERD 6. Diabetic gastroparesis: Admission 11/18 with abdominal pain, nausea/vomiting.  - [facility] workup 2019: Gastric emptying study near normal.  Negative breath test (no evidence for small bowel bacterial overgrowth).  Electrogastrogram showed poor gastric accomodation/capacity, "bradygastria."  7. HTN 8. Hyperlipidemia 9. Type II diabetes  10. ESRD: Post-op LVAD surgery.  He has had failed AV fistula and AV graft, now dialyzing via catheter . 11. Carotid dopplers (8/18): mild bilateral stenosis.  12. PFTs (8/18): Mild restriction (no IDL on CT chest).  13. Lung nodules: CT chest 8/18 with small bilateral nodules.  14. PAD: ABIs with moderate bilateral disease in 8/18.  - Peripheral angiogram 03/18/19 showed 80% right SFA stenosis and occluded right PT. He had stenting of SFA and PTCA of PT with restoration of flow. However, he still needed amputation right 4th toe on 03/21/19 with intractable pain.  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  - Peripheral angiogram left leg: DES to left SFA, only 1 vessel runoff via severely diseased peroneal artery, unable to intervene. 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  - Left AKA 11/20.  15. Atrial  flutter: In 11/18 associated with nausea/vomiting from gastroparesis.   Current Outpatient Medications  Medication Sig Dispense Refill  . aspirin EC 81 MG tablet Take 1 tablet (81 mg total) by mouth daily. 90 tablet 3  . atorvastatin (LIPITOR) 40 MG tablet Take 1 tablet (40 mg total) by mouth daily at 6 PM. 90 tablet 3  . calcitRIOL (ROCALTROL) 0.5 MCG capsule Take 1 capsule (0.5 mcg total) by mouth every Monday, Wednesday, and Friday. 15 capsule 5  . citalopram (CELEXA) 20 MG tablet Take 1 tablet (20 mg total) by mouth daily. 30 tablet 6  . LANTUS 100 UNIT/ML injection INJECT 0.2 MLS (20 UNITS TOTAL) INTO THE SKIN AT BEDTIME. ([name] taking differently: Inject 35 Units into the skin at bedtime.) 10 mL 2  . LORazepam (ATIVAN) 0.5 MG tablet Take 1 tablet (0.5 mg total) by mouth every 12 (twelve) hours as needed (nausea). 60 tablet 0  . metoCLOPramide (REGLAN) 10 MG tablet Take 1 tablet (10 mg total) by mouth 3 (three) times daily. ([name] taking differently: Take 10 mg by mouth 3 (three) times daily before meals.) 90 tablet 6  . midodrine (PROAMATINE) 5 MG tablet Take 3 tablets (15 mg total) by mouth every Monday, Wednesday, and Friday. Take 1 tablet before dialysis treatment 60 tablet 6  . multivitamin (RENA-VIT) TABS tablet Take 1 tablet by mouth daily.    [name] oxyCODONE (ROXICODONE) 5 MG immediate release tablet Take 1 tablet (5 mg total) by mouth 2 (two) times daily as needed for severe pain. Take 1-2 tablets every 4-6 hours for severe pain. 30 tablet 0  . oxyCODONE-acetaminophen (PERCOCET) 5-325 MG tablet Take 1 tablet by mouth every 6 (six) hours as needed for severe pain. 10 tablet 0  . pantoprazole (PROTONIX) 40 MG tablet Take 1 tablet (40 mg total) by mouth 2 (two) times daily before lunch and supper. 60 tablet 11  . promethazine (PHENERGAN) 12.5 MG tablet TAKE 1 TABLET (12.5 MG TOTAL) BY MOUTH EVERY 6 (SIX) HOURS AS NEEDED FOR NAUSEA OR VOMITING. 30 tablet 3  . sevelamer carbonate (RENVELA)  0.8 g PACK packet Take 0.8 g by mouth 3 (three) times daily with meals. 270 each 1  .  warfarin (COUMADIN) 5 MG tablet Take 1 tablet (5 mg total) by mouth daily. 30 tablet 11  . gabapentin (NEURONTIN) 300 MG capsule Take 1 capsule (300 mg total) by mouth 2 (two) times daily. ([name] not taking: Reported on 09/24/2020) 60 capsule 2   No current facility-administered medications for this encounter.    Metformin and related   Review of systems complete and found to be negative unless listed in HPI.    BP (!) 106/0   Pulse 95   SpO2 100%   MAP 95  VAD interrogation & Equipment Management: See LVAD nurse's note above.   General: Well appearing this am. NAD.  HEENT: Normal. Neck: Supple, JVP 7-8 cm. Carotids OK.  Cardiac:  Mechanical heart sounds with LVAD hum present.  Lungs:  CTAB, normal effort.  Abdomen:  NT, ND, no HSM. No bruits or masses. +BS  LVAD exit site: Well-healed and incorporated. Dressing dry and intact. No erythema or drainage. Stabilization device present and accurately applied. Driveline dressing changed daily per sterile technique. Extremities:  Bilateral AKAs. No cyanosis, clubbing, rash, or edema.   Neuro:  Alert & oriented x 3. Cranial nerves grossly intact. Moves all 4 extremities w/o difficulty. Affect pleasant     ASSESSMENT AND PLAN: 1. PAD/ischemic feet:  He is now s/p bilateral AKAs.  Coping reasonably well, able to transfer from bed to wheelchair on his own.  2. Chronic systolic CHF: Ischemic cardiomyopathy. St Jude ICD. NSTEMI in March 2018 with DES to LAD and RCA, complicated by cardiogenic shock, low output requiring milrinone. Echo 8/18 with EF 20-25%, moderate MR. CPX (8/18) with severe functional impairment due to HF. He is s/p HM3 LVAD placement. He had pump thrombosis 6/19 due to [facility] outflow graft kinking. He had pump exchange for another HM3.  LVAD parameters stable, occasional low flows with HD but this has been chronic.  - Volume managed at  dialysis, MWF.   - Continue ASA 81and warfarin with INR goal 2-2.5.    - He had been referred to [facility] for evaluation for heart/kidney transplant, but he is not candidate for transplantnowgiven extensive PAD. 3. ESRD: HD on M-W-F with midodrine pre-HD.  - Need to allow UF as long as MAP > 70 (would not stop for MAP 70-80).   - Continue pre-HD midodrine 15 mg.  4.CAD: NSTEMI in 3/18. LHC with 99% ulcerated lesion proximal RCA with left to right collaterals, 95% mid LAD stenosis after mid LAD stent. s/p PCI to RCA and LAD on 11/25/16.No chest pain.  - Continue statin and ASA. 5. h/o DVTs: Factor V Leiden heterozygote. -Anticoagulated. 6. Diabetic gastroparesis: He has seen gastroparesis specialist at [facility]. Surprisingly, gastric emptying study was normal. However, electrogastrogram showed poor gastric accomodation/capacity. Therefore, small frequent meals recommended.He has been doing much better with this, minimal symptoms.  - Continue reglan 10 mg tid/ac, now off Marinol.  - Continue Protonix bid.  - Limit narcotic use as much as possible.  7. HTN:MAP generally controlled.  8. Chronic pain: Now seen in a pain clinic.   Followup in 2 months.   Rino Hosea 09/24/2020

## 2020-09-24 NOTE — Patient Instructions (Signed)
1. No changes in medications. 2. Return to clinic in 2 months

## 2020-09-29 ENCOUNTER — Telehealth: Payer: Self-pay

## 2020-09-29 ENCOUNTER — Ambulatory Visit (HOSPITAL_COMMUNITY): Payer: Self-pay | Admitting: Pharmacist

## 2020-09-29 DIAGNOSIS — E118 Type 2 diabetes mellitus with unspecified complications: Secondary | ICD-10-CM

## 2020-09-29 DIAGNOSIS — Z794 Long term (current) use of insulin: Secondary | ICD-10-CM

## 2020-09-29 LAB — POCT INR: INR: 2.2 (ref 2.0–3.0)

## 2020-09-29 MED ORDER — INSULIN GLARGINE 100 UNIT/ML ~~LOC~~ SOLN: 35.0000 [IU] | Freq: Every day | SUBCUTANEOUS | 3 refills | Status: DC

## 2020-09-29 NOTE — Telephone Encounter (Signed)
Filled it. Thanks!

## 2020-09-29 NOTE — Telephone Encounter (Signed)
Patient calls nurse line regarding issues with refilling Lantus. Patient reports that he has approx 2-3 days left of insulin, however, pharmacy cannot refill due to it being too early to fill.   Patient reports using 35 units of insulin daily, however, rx is written for 20 units daily. In order for patient to pick up rx, rx directions would need to be updated to reflect the increased dosage.   Please advise if change is appropriate. If so, please send new rx to [facility] on [address].  Talbot Grumbling, RN

## 2020-09-29 NOTE — Progress Notes (Signed)
LVAD INR 

## 2020-09-29 NOTE — Telephone Encounter (Signed)
Patient returns call to nurse line to check status of rx. Advised patient of 21-19 hour policy on rx refills. Patient verbalizes understanding.   Talbot Grumbling, RN

## 2020-10-06 ENCOUNTER — Ambulatory Visit (HOSPITAL_COMMUNITY): Payer: Self-pay | Admitting: Pharmacist

## 2020-10-06 LAB — POCT INR: INR: 2.6 (ref 2.0–3.0)

## 2020-10-06 NOTE — Progress Notes (Signed)
LVAD INR 

## 2020-10-13 ENCOUNTER — Ambulatory Visit (HOSPITAL_COMMUNITY): Payer: Self-pay | Admitting: Pharmacist

## 2020-10-13 LAB — POCT INR: INR: 2.2 (ref 2.0–3.0)

## 2020-10-13 NOTE — Progress Notes (Signed)
LVAD INR 

## 2020-10-20 ENCOUNTER — Ambulatory Visit (HOSPITAL_COMMUNITY): Payer: Self-pay | Admitting: Pharmacist

## 2020-10-20 LAB — POCT INR: INR: 2.7 (ref 2.0–3.0)

## 2020-10-20 NOTE — Progress Notes (Signed)
LVAD INR 

## 2020-10-27 ENCOUNTER — Ambulatory Visit (HOSPITAL_COMMUNITY): Payer: Self-pay | Admitting: Pharmacist

## 2020-10-27 LAB — POCT INR: INR: 2.5 (ref 2.0–3.0)

## 2020-10-27 NOTE — Progress Notes (Signed)
LVAD INR 

## 2020-11-03 ENCOUNTER — Ambulatory Visit (HOSPITAL_COMMUNITY): Payer: Self-pay | Admitting: Pharmacist

## 2020-11-03 LAB — POCT INR: INR: 2.1 (ref 2.0–3.0)

## 2020-11-03 NOTE — Progress Notes (Signed)
LVAD INR 

## 2020-11-06 ENCOUNTER — Encounter: Payer: Self-pay | Admitting: Internal Medicine

## 2020-11-06 ENCOUNTER — Ambulatory Visit (INDEPENDENT_AMBULATORY_CARE_PROVIDER_SITE_OTHER): Payer: Medicare Other | Admitting: Internal Medicine

## 2020-11-06 ENCOUNTER — Encounter (INDEPENDENT_AMBULATORY_CARE_PROVIDER_SITE_OTHER): Payer: Self-pay

## 2020-11-06 ENCOUNTER — Other Ambulatory Visit: Payer: Self-pay

## 2020-11-06 VITALS — BP 100/68 | HR 97

## 2020-11-06 DIAGNOSIS — Z4502 Encounter for adjustment and management of automatic implantable cardiac defibrillator: Secondary | ICD-10-CM

## 2020-11-06 DIAGNOSIS — I5022 Chronic systolic (congestive) heart failure: Secondary | ICD-10-CM

## 2020-11-06 DIAGNOSIS — I255 Ischemic cardiomyopathy: Secondary | ICD-10-CM

## 2020-11-06 DIAGNOSIS — I10 Essential (primary) hypertension: Secondary | ICD-10-CM

## 2020-11-06 LAB — CUP PACEART INCLINIC DEVICE CHECK
Battery Remaining Longevity: 60 mo
Brady Statistic RA Percent Paced: 0.05 %
Brady Statistic RV Percent Paced: 0.02 %
Date Time Interrogation Session: 20220304155300
HighPow Impedance: 56.8088
Implantable Lead Implant Date: 20100430
Implantable Lead Implant Date: 20100430
Implantable Lead Location: 753859
Implantable Lead Location: 753860
Implantable Lead Model: 7121
Implantable Pulse Generator Implant Date: 20180110
Lead Channel Impedance Value: 362.5 Ohm
Lead Channel Impedance Value: 687.5 Ohm
Lead Channel Pacing Threshold Amplitude: 0.75 V
Lead Channel Pacing Threshold Amplitude: 0.75 V
Lead Channel Pacing Threshold Amplitude: 1 V
Lead Channel Pacing Threshold Amplitude: 1 V
Lead Channel Pacing Threshold Pulse Width: 0.5 ms
Lead Channel Pacing Threshold Pulse Width: 0.5 ms
Lead Channel Pacing Threshold Pulse Width: 0.5 ms
Lead Channel Pacing Threshold Pulse Width: 0.5 ms
Lead Channel Sensing Intrinsic Amplitude: 1.5 mV
Lead Channel Sensing Intrinsic Amplitude: 12 mV
Lead Channel Setting Pacing Amplitude: 2.5 V
Lead Channel Setting Pacing Amplitude: 2.5 V
Lead Channel Setting Pacing Pulse Width: 0.5 ms
Lead Channel Setting Sensing Sensitivity: 0.5 mV
Pulse Gen Serial Number: 7383809

## 2020-11-06 NOTE — Progress Notes (Signed)
Patient Care Team: Richarda Osmond, DO as PCP - General (Family Medicine) Larey Dresser, MD as PCP - [facility], [facility] Louann Liv, [place] as Social Worker (Licensed Clinical Social Worker)   HPI  [name] is a 55 y.o. male Seen In followup for an Athena ICD implanted 2010 for primary prevention and generator replacement 1/18   THx  prior MI, prior stenting and ischemic cardiomyopathy with an ejection fraction of 15-20% and patent stents .    He then was admitted 3/18 with acute kidney failure cardiac failure requiring inotropic support. He underwent a 25 pound diuresis. Elevated troponins prompted catheterization and he had an RCA and LAD stent placed  Echo EF was 20-25%  Subsequently has had a VAT implanted.  Because of peripheral vascular disease is undergone bilateral AKA  No chest pain or shortness of breath.       Past Medical History:  Diagnosis Date  . Angina   . ASCVD (arteriosclerotic cardiovascular disease)    , Anterior infarction 2005, LAD diagonal bifurcation intervention 03/2004  . Automatic implantable cardiac defibrillator -St. Jude's       . Benign neoplasm of colon   . CHF (congestive heart failure) ([place])   . Chronic systolic heart failure ([place])   . Coronary artery disease     Widely patent previously placed stents in the left anterior   . Crohn's disease ([place])   . Deep venous thrombosis (HCC)    Recurrent-on Coumadin  . Dialysis patient ([place])   . Dyspnea   . Gastroparesis   . GERD (gastroesophageal reflux disease)   . High cholesterol   . Hyperlipidemia   . Hypersomnolent    Previous diagnosis of narcolepsy  . Hypertension, essential   . Ischemic cardiomyopathy    Ejection fraction 15-20% catheterization 2010  . Type II or unspecified type diabetes mellitus without mention of complication, not stated as uncontrolled   . Unspecified gastritis and gastroduodenitis  without mention of hemorrhage     Past Surgical History:  Procedure Laterality Date  . A/V SHUNTOGRAM N/A 05/24/2018   Procedure: A/V SHUNTOGRAM;  Surgeon: Marty Heck, MD;  Location: [place] [facility];  Service: Cardiovascular;  Laterality: N/A;  . ABDOMINAL AORTOGRAM W/LOWER EXTREMITY N/A 03/18/2019   Procedure: ABDOMINAL AORTOGRAM W/LOWER EXTREMITY;  Surgeon: Waynetta Sandy, MD;  Location: [place] [facility];  Service: Cardiovascular;  Laterality: N/A;  . AMPUTATION Right 03/21/2019   Procedure: AMPUTATION DIGIT RIGHT FOURTH TOE;  Surgeon: Waynetta Sandy, MD;  Location: [place];  Service: Vascular;  Laterality: Right;  . AMPUTATION Left 05/29/2019   Procedure: AMPUTATION LEFT GREAT TOE;  Surgeon: Serafina Mitchell, MD;  Location: [place];  Service: Vascular;  Laterality: Left;  . AMPUTATION Right 06/02/2019   Procedure: ABOVE KNEE AMPUTATION;  Surgeon: Rosetta Posner, MD;  Location: [place];  Service: Vascular;  Laterality: Right;  . AMPUTATION Left 07/23/2019   Procedure: AMPUTATION ABOVE KNEE LEFT;  Surgeon: Elam Dutch, MD;  Location: [place];  Service: Vascular;  Laterality: Left;  . APPENDECTOMY    . AV FISTULA PLACEMENT Right 06/26/2017   Procedure: ARTERIOVENOUS (AV) FISTULA CREATION VERSUS GRAFT RIGHT  ARM;  Surgeon: Conrad K[place]or[place], MD;  Location[facility] OR;  Service: Vascular;  Laterality: Right;  . AV FISTULA PLACEMENT Right 10/31/2017   Procedure: INSERTION OF ARTERIOVENOUS (AV) ARTEGRAFT,  RIGHT UPPER ARM;  Surgeon: Conrad [place], MD;  Location: [facility] OR;  Service: Vascular;  Laterality: Right;  . AV FISTULA PLACEMENT Left 05/29/2018   Procedure: ARTERIOVENOUS (AV) FISTULA CREATION  LEFT UPPER EXTREMITY;  Surgeon: Waynetta Sandy, MD;  Location: [place];  Service: Vascular;  Laterality: Left;  . AV FISTULA PLACEMENT Left 08/09/2018   Procedure: INSERTION OF ARTERIOVENOUS (AV) GORE-TEX GRAFT LEFT UPPER ARM;  Surgeon: Waynetta Sandy, MD;  Location:  [place];  Service: Vascular;  Laterality: Left;  . CARDIAC DEFIBRILLATOR PLACEMENT  2010   St. Jude ICD  . CENTRAL LINE INSERTION Right 05/24/2018   Procedure: CENTRAL LINE INSERTION;  Surgeon: Marty Heck, MD;  Location: [place] [facility];  Service: Cardiovascular;  Laterality: Right;  . COLECTOMY  ~ 2003   "for Crohn's" ascending colon.    . CORONARY STENT INTERVENTION N/A 11/25/2016   Procedure: Coronary Stent Intervention;  Surgeon: Leonie Man, MD;  Location: [place] [facility];  Service: Cardiovascular;  Laterality: N/A;  . EP IMPLANTABLE DEVICE N/A 09/14/2016   Procedure: ICD Generator Changeout;  Surgeon: Deboraha Sprang, MD;  Location: [place] [facility];  Service: Cardiovascular;  Laterality: N/A;  . ESOPHAGOGASTRODUODENOSCOPY N/A 07/12/2017   Procedure: ESOPHAGOGASTRODUODENOSCOPY (EGD);  Surgeon: Jerene Bears, MD;  Location: [facility] ENDOSCOPY;  Service: Gastroenterology;  Laterality: N/A;  VAD pt so VAD team will need to accompany pt.   [name] FETAL SURGERY FOR CONGENITAL HERNIA     ????? pt has no knowledge of this..    . FISTULOGRAM Left 08/09/2018   Procedure: FISTULOGRAM LEFT ARM;  Surgeon: Waynetta Sandy, MD;  Location: [place];  Service: Vascular;  Laterality: Left;  . INGUINAL HERNIA REPAIR    . INSERTION OF DIALYSIS CATHETER Right 06/26/2017   Procedure: INSERTION OF TUNNELED  DIALYSIS CATHETER RIGHT INTERNAL JUGULAR;  Surgeon: Conrad [place], MD;  Location: [place];  Service: Vascular;  Laterality: Right;  . INSERTION OF IMPLANTABLE LEFT VENTRICULAR ASSIST DEVICE N/A 06/12/2017   Procedure: INSERTION OF IMPLANTABLE LEFT VENTRICULAR ASSIST [place] 3;  Surgeon: Ivin Poot, MD;  Location: [place];  Service: Open Heart Surgery;  Laterality: N/A;  HM3 LVAD  CIRC ARREST  NITRIC OXIDE  . INSERTION OF IMPLANTABLE LEFT VENTRICULAR ASSIST DEVICE N/A 02/28/2018   Procedure: REDO INSERTION OF IMPLANTABLE LEFT VENTRICULAR ASSIST DEVICE - HEARTMATE 3;  Surgeon: Ivin Poot, MD;  Location: [place];  Service: Open Heart Surgery;  Laterality: N/A;  . IR FLUORO GUIDE CV LINE RIGHT  07/22/2019  . IR REMOVAL TUN CV CATH W/O FL  02/26/2018  . IR THORACENTESIS ASP PLEURAL SPACE W/IMG GUIDE  06/28/2017  . IR US GUIDE VASC ACCESS RIGHT  07/22/2019  . LIGATION OF ARTERIOVENOUS  FISTULA Right 10/31/2017   Procedure: LIGATION OF BRACHIO-BASILIC VEIN TRANSPOSITION RIGHT ARM;  Surgeon: Conrad Rankin, MD;  Location: [place];  Service: Vascular;  Laterality: Right;  . LOWER EXTREMITY ANGIOGRAPHY  05/02/2019   Procedure: Lower Extremity Angiography;  Surgeon: Marty Heck, MD;  Location: [place] [facility];  Service: Cardiovascular;;  . MULTIPLE EXTRACTIONS WITH ALVEOLOPLASTY N/A 06/08/2017   Procedure: MULTIPLE EXTRACTION WITH ALVEOLOPLASTY AND PRE PROSTHETIC SURGERY AS NEEDED;  Surgeon: Lenn Cal, DDS;  Location: [place];  Service: Oral Surgery;  Laterality: N/A;  . PERIPHERAL VASCULAR BALLOON ANGIOPLASTY  03/18/2019   Procedure: PERIPHERAL VASCULAR BALLOON ANGIOPLASTY;  Surgeon: Waynetta Sandy, MD;  Location: [place] [facility];  Service: Cardiovascular;;  RT PT  . PERIPHERAL VASCULAR INTERVENTION  03/18/2019   Procedure: PERIPHERAL VASCULAR INTERVENTION;  Surgeon: Waynetta Sandy, MD;  Location: [place] [facility];  Service: Cardiovascular;;  RT SFA  . PERIPHERAL VASCULAR INTERVENTION  05/02/2019   Procedure: PERIPHERAL VASCULAR INTERVENTION;  Surgeon: Marty Heck, MD;  Location: [place] [facility];  Service: Cardiovascular;;  Lt. SFA  . RIGHT HEART CATH N/A 06/07/2017   Procedure: RIGHT HEART CATH;  Surgeon: Larey Dresser, MD;  Location: [place] [facility];  Service: Cardiovascular;  Laterality: N/A;  . RIGHT HEART CATH N/A 02/08/2018   Procedure: RIGHT HEART CATH;  Surgeon: Larey Dresser, MD;  Location: [place] [facility];  Service: Cardiovascular;  Laterality: N/A;  . RIGHT HEART CATH N/A 08/09/2018   Procedure: RIGHT HEART  CATH;  Surgeon: Larey Dresser, MD;  Location: [place] [facility];  Service: Cardiovascular;  Laterality: N/A;  . RIGHT/LEFT HEART CATH AND CORONARY ANGIOGRAPHY N/A 11/23/2016   Procedure: Right/Left Heart Cath and Coronary Angiography;  Surgeon: Larey Dresser, MD;  Location: [place] [facility];  Service: Cardiovascular;  Laterality: N/A;  . TEE WITHOUT CARDIOVERSION N/A 06/12/2017   Procedure: TRANSESOPHAGEAL ECHOCARDIOGRAM (TEE);  Surgeon: Prescott Gum, Collier Salina, MD;  Location: [place];  Service: Open Heart Surgery;  Laterality: N/A;  . TEE WITHOUT CARDIOVERSION N/A 02/28/2018   Procedure: TRANSESOPHAGEAL ECHOCARDIOGRAM (TEE);  Surgeon: Prescott Gum, Collier Salina, MD;  Location: [place];  Service: Open Heart Surgery;  Laterality: N/A;  . TEMPORARY DIALYSIS CATHETER Left 05/24/2018   Procedure: TEMPORARY DIALYSIS CATHETER;  Surgeon: Marty Heck, MD;  Location: [place] [facility];  Service: Cardiovascular;  Laterality: Left;  . THROMBECTOMY AND REVISION OF ARTERIOVENTOUS (AV) GORETEX  GRAFT Right 12/12/2017   Procedure: THROMBECTOMY ARTERIOVENTOUS (AV) GORETEX  GRAFT RIGHT UPPER ARM;  Surgeon: Conrad [place], MD;  Location: [place];  Service: Vascular;  Laterality: Right;  . TRANSMETATARSAL AMPUTATION Right 04/30/2019   Procedure: RIGHT TRANSMETATARSAL AMPUTATION;  Surgeon: Waynetta Sandy, MD;  Location: [place];  Service: Vascular;  Laterality: Right;    Current Outpatient Medications  Medication Sig Dispense Refill  . aspirin EC 81 MG tablet Take 1 tablet (81 mg total) by mouth daily. 90 tablet 3  . atorvastatin (LIPITOR) 40 MG tablet Take 1 tablet (40 mg total) by mouth daily at 6 PM. 90 tablet 3  . calcitRIOL (ROCALTROL) 0.5 MCG capsule Take 1 capsule (0.5 mcg total) by mouth every Monday, Wednesday, and Friday. 15 capsule 5  . citalopram (CELEXA) 20 MG tablet Take 1 tablet (20 mg total) by mouth daily. 30 tablet 6  . gabapentin (NEURONTIN) 300 MG capsule Take 1 capsule (300 mg total) by mouth 2  (two) times daily. (Patient not taking: Reported on 09/24/2020) 60 capsule 2  . insulin glargine (LANTUS) 100 UNIT/ML injection Inject 0.35 mLs (35 Units total) into the skin daily. 10 mL 3  . LORazepam (ATIVAN) 0.5 MG tablet Take 1 tablet (0.5 mg total) by mouth every 12 (twelve) hours as needed (nausea). 60 tablet 0  . metoCLOPramide (REGLAN) 10 MG tablet Take 1 tablet (10 mg total) by mouth 3 (three) times daily. (Patient taking differently: Take 10 mg by mouth 3 (three) times daily before meals.) 90 tablet 6  . midodrine (PROAMATINE) 5 MG tablet Take 3 tablets (15 mg total) by mouth every Monday, Wednesday, and Friday. Take 1 tablet before dialysis treatment 60 tablet 6  . multivitamin (RENA-VIT) TABS tablet Take 1 tablet by mouth daily.    [name] oxyCODONE (  ROXICODONE) 5 MG immediate release tablet Take 1 tablet (5 mg total) by mouth 2 (two) times daily as needed for severe pain. Take 1-2 tablets every 4-6 hours for severe pain. 30 tablet 0  . pantoprazole (PROTONIX) 40 MG tablet Take 1 tablet (40 mg total) by mouth 2 (two) times daily before lunch and supper. 60 tablet 11  . promethazine (PHENERGAN) 12.5 MG tablet TAKE 1 TABLET (12.5 MG TOTAL) BY MOUTH EVERY 6 (SIX) HOURS AS NEEDED FOR NAUSEA OR VOMITING. 30 tablet 3  . sevelamer carbonate (RENVELA) 0.8 g PACK packet Take 0.8 g by mouth 3 (three) times daily with meals. 270 each 1  . warfarin (COUMADIN) 5 MG tablet Take 1 tablet (5 mg total) by mouth daily. 30 tablet 11   No current facility-administered medications for this visit.    Allergies  Allergen Reactions  . Metformin And Related Diarrhea    Review of Systems negative except from HPI and PMH  Physical Exam.pped BP 100/68   Pulse 97   SpO2 96%  Well developed and well nourished in no acute distress HENT normal Neck supple  Clear Device pocket well healed; without hematoma or erythema.  There is no tethering  Regular rate and rhythm vad Hum Abd-soft   No Clubbing cyanosis    Skin-warm and dry A & Oriented  Grossly normal sensory and motor function  ECG uninterpretable   Assessment and  Plan  Hypertension  Ischemic cardiomyopathy     Congestive heart failure-chronic   Euvolemic continue current meds  Implantable defibrillator - [employer]      Renal insufficiency grade 3  LVAD in place  Euvolemic.  Device function normal.  Interval atrial tachycardia with a modestly rapid ventricular response but not encroaching upon his device detection rates  Discussed also the role of an ICD in the context of CAD and the decision some time to inactivate the ICD and also not to replace the ICD

## 2020-11-06 NOTE — Patient Instructions (Signed)
Medication Instructions:  Your physician recommends that you continue on your current medications as directed. Please refer to the Current Medication list given to you today.  Labwork: None ordered.  Testing/Procedures: None ordered.  Follow-Up: Your physician wants you to follow-up in: one year with Virl Axe, MD or one of the following Advanced Practice Providers on your designated Care Team:    Chanetta Marshall, NP  Tommye Standard, PA-C  Legrand Como "Z[place]" [place], [place]   You will receive a reminder letter in the mail two months in advance. If you don't receive a letter, please call our office to schedule the follow-up appointment.  Remote monitoring is used to monitor your Pacemaker of ICD from home. This monitoring reduces the number of office visits required to check your device to one time per year. It allows [place] to keep an eye on the functioning of your device to ensure it is working properly. You are scheduled for a device check from home on 11/17/20. You may send your transmission at any time that day. If you have a wireless device, the transmission will be sent automatically. After your physician reviews your transmission, you will receive a postcard with your next transmission date.  Any Other Special Instructions Will Be Listed Below (If Applicable).  If you need a refill on your cardiac medications before your next appointment, please call your pharmacy.

## 2020-11-10 ENCOUNTER — Ambulatory Visit (HOSPITAL_COMMUNITY): Payer: Self-pay | Admitting: Pharmacist

## 2020-11-10 LAB — POCT INR: INR: 2.3 (ref 2.0–3.0)

## 2020-11-10 NOTE — Progress Notes (Signed)
LVAD INR 

## 2020-11-17 ENCOUNTER — Ambulatory Visit (HOSPITAL_COMMUNITY): Payer: Self-pay | Admitting: Pharmacist

## 2020-11-17 ENCOUNTER — Ambulatory Visit (INDEPENDENT_AMBULATORY_CARE_PROVIDER_SITE_OTHER): Payer: Medicare Other

## 2020-11-17 DIAGNOSIS — I255 Ischemic cardiomyopathy: Secondary | ICD-10-CM

## 2020-11-17 LAB — CUP PACEART REMOTE DEVICE CHECK
Battery Remaining Longevity: 55 mo
Battery Remaining Percentage: 59 %
Battery Voltage: 2.95 V
Brady Statistic AP VP Percent: 1 %
Brady Statistic AP VS Percent: 1 %
Brady Statistic AS VP Percent: 1 %
Brady Statistic AS VS Percent: 99 %
Brady Statistic RA Percent Paced: 1 %
Brady Statistic RV Percent Paced: 1 %
Date Time Interrogation Session: 20220315142656
HighPow Impedance: 56 Ohm
HighPow Impedance: 56 Ohm
Implantable Lead Implant Date: 20100430
Implantable Lead Implant Date: 20100430
Implantable Lead Location: 753859
Implantable Lead Location: 753860
Implantable Lead Model: 7121
Implantable Pulse Generator Implant Date: 20180110
Lead Channel Impedance Value: 360 Ohm
Lead Channel Impedance Value: 660 Ohm
Lead Channel Pacing Threshold Amplitude: 0.75 V
Lead Channel Pacing Threshold Amplitude: 1 V
Lead Channel Pacing Threshold Pulse Width: 0.5 ms
Lead Channel Pacing Threshold Pulse Width: 0.5 ms
Lead Channel Sensing Intrinsic Amplitude: 1.2 mV
Lead Channel Sensing Intrinsic Amplitude: 12 mV
Lead Channel Setting Pacing Amplitude: 2.5 V
Lead Channel Setting Pacing Amplitude: 2.5 V
Lead Channel Setting Pacing Pulse Width: 0.5 ms
Lead Channel Setting Sensing Sensitivity: 0.5 mV
Pulse Gen Serial Number: 7383809

## 2020-11-17 LAB — POCT INR: INR: 2.1 (ref 2.0–3.0)

## 2020-11-17 NOTE — Progress Notes (Signed)
LVAD INR 

## 2020-11-19 ENCOUNTER — Other Ambulatory Visit: Payer: Self-pay

## 2020-11-19 DIAGNOSIS — I255 Ischemic cardiomyopathy: Secondary | ICD-10-CM

## 2020-11-19 DIAGNOSIS — I5022 Chronic systolic (congestive) heart failure: Secondary | ICD-10-CM

## 2020-11-20 MED ORDER — ATORVASTATIN CALCIUM 40 MG PO TABS: 40.0000 mg | ORAL_TABLET | Freq: Every day | ORAL | 3 refills | Status: DC

## 2020-11-24 ENCOUNTER — Ambulatory Visit (HOSPITAL_COMMUNITY)
Admission: RE | Admit: 2020-11-24 | Discharge: 2020-11-24 | Disposition: A | Discharge status: Home / Self Care | Payer: Medicare Other | Source: Ambulatory Visit | Attending: Cardiology | Admitting: Cardiology

## 2020-11-24 ENCOUNTER — Other Ambulatory Visit: Payer: Self-pay

## 2020-11-24 ENCOUNTER — Ambulatory Visit (HOSPITAL_COMMUNITY): Payer: Self-pay | Admitting: Pharmacist

## 2020-11-24 VITALS — BP 124/0 | HR 99

## 2020-11-24 DIAGNOSIS — Z79899 Other long term (current) drug therapy: Secondary | ICD-10-CM | POA: Diagnosis not present

## 2020-11-24 DIAGNOSIS — E1143 Type 2 diabetes mellitus with diabetic autonomic (poly)neuropathy: Secondary | ICD-10-CM | POA: Diagnosis not present

## 2020-11-24 DIAGNOSIS — I2510 Atherosclerotic heart disease of native coronary artery without angina pectoris: Secondary | ICD-10-CM | POA: Insufficient documentation

## 2020-11-24 DIAGNOSIS — I739 Peripheral vascular disease, unspecified: Secondary | ICD-10-CM | POA: Insufficient documentation

## 2020-11-24 DIAGNOSIS — D6851 Activated protein C resistance: Secondary | ICD-10-CM | POA: Insufficient documentation

## 2020-11-24 DIAGNOSIS — Z89611 Acquired absence of right leg above knee: Secondary | ICD-10-CM | POA: Insufficient documentation

## 2020-11-24 DIAGNOSIS — I5022 Chronic systolic (congestive) heart failure: Secondary | ICD-10-CM | POA: Diagnosis not present

## 2020-11-24 DIAGNOSIS — Z794 Long term (current) use of insulin: Secondary | ICD-10-CM | POA: Diagnosis not present

## 2020-11-24 DIAGNOSIS — I132 Hypertensive heart and chronic kidney disease with heart failure and with stage 5 chronic kidney disease, or end stage renal disease: Secondary | ICD-10-CM | POA: Insufficient documentation

## 2020-11-24 DIAGNOSIS — I4892 Unspecified atrial flutter: Secondary | ICD-10-CM | POA: Insufficient documentation

## 2020-11-24 DIAGNOSIS — I255 Ischemic cardiomyopathy: Secondary | ICD-10-CM | POA: Insufficient documentation

## 2020-11-24 DIAGNOSIS — Z89612 Acquired absence of left leg above knee: Secondary | ICD-10-CM | POA: Diagnosis not present

## 2020-11-24 DIAGNOSIS — Z7982 Long term (current) use of aspirin: Secondary | ICD-10-CM | POA: Insufficient documentation

## 2020-11-24 DIAGNOSIS — E1122 Type 2 diabetes mellitus with diabetic chronic kidney disease: Secondary | ICD-10-CM | POA: Insufficient documentation

## 2020-11-24 DIAGNOSIS — Z95811 Presence of heart assist device: Secondary | ICD-10-CM | POA: Diagnosis not present

## 2020-11-24 DIAGNOSIS — Z955 Presence of coronary angioplasty implant and graft: Secondary | ICD-10-CM | POA: Diagnosis not present

## 2020-11-24 DIAGNOSIS — Z992 Dependence on renal dialysis: Secondary | ICD-10-CM | POA: Diagnosis not present

## 2020-11-24 DIAGNOSIS — N186 End stage renal disease: Secondary | ICD-10-CM | POA: Diagnosis not present

## 2020-11-24 DIAGNOSIS — I252 Old myocardial infarction: Secondary | ICD-10-CM | POA: Insufficient documentation

## 2020-11-24 DIAGNOSIS — K3184 Gastroparesis: Secondary | ICD-10-CM | POA: Diagnosis not present

## 2020-11-24 DIAGNOSIS — Z7901 Long term (current) use of anticoagulants: Secondary | ICD-10-CM | POA: Insufficient documentation

## 2020-11-24 LAB — POCT INR: INR: 2.1 (ref 2.0–3.0)

## 2020-11-24 MED ORDER — HYDRALAZINE HCL 25 MG PO TABS: ORAL_TABLET | ORAL | 3 refills | Status: DC

## 2020-11-24 MED ORDER — NITROGLYCERIN 0.4 MG SL SUBL: 0.4000 mg | SUBLINGUAL_TABLET | SUBLINGUAL | 3 refills | Status: DC | PRN

## 2020-11-24 NOTE — Progress Notes (Addendum)
Patient presents for 2 month follow up visit follow up in [facility] today with his friend. Reports no problems with VAD equipment or concerns with drive line.   Pt states that dialysis is going well. Pt states that he has not had any low flows during dialysis in a while. Pt is very pleasant today and states that he feels great.   Pt has not had any arrythmia on his device remotes in quite some time. Dr. Aundra Dubin dcd Amiodarone today.  Pt's only complaint today is that he had an episode of acute chest pain Sunday night. He paged the VAD pager for this episode. Pt states that the pain was associated with some nausea but it quickly subsided. Pt was just sitting in the recliner watching TV when this occurred. Due to this episode Dr. Aundra Dubin is prescribing pt nitro tabs just to have in case of an emergency.   pts BP is slightly elevated today, we will restart Hydralazine 25 mg 3x a day just on NON dialysis days.   Pt states that his nausea has been much better lately and that he has not taken any phenergan or Zofran since 2021.  Labs received from dialysis were drawn on 11/18/20.   Vital Signs:  Doppler Pressure  124 Automatc BP: 117/93(101) HR:  99 SPO2: 97 % on RA  Weight: UTO lb w/o eqt Last weight: 200 lb   VAD Indication: Destination therapy- Pt is no longer candidate for heart/kidney transplant due to PAD per [facility].   VAD interrogation & Equipment Management: Speed: 5500 Flow: 3.3 Power: 4.7w    PI: 8.3  Alarms: nonr Events: 80 yesterday at dialysis  Fixed speed 5500 Low speed limit: 5200  Primary Controller: Replace back up battery in 27 months. Back up controller:   Replace back up battery in 7 months  Annual Equipment Maintenance on UBC/PM was performed on 02/2020.   I reviewed the LVAD parameters from today and compared the results to the patient's prior recorded data. LVAD interrogation was NEGATIVE for significant power changes, NEGATIVE for clinical alarms and  STABLE for PI events/speed drops. No programming changes were made and pump is functioning within specified parameters. Pt is performing daily controller and system monitor self tests along with completing weekly and monthly maintenance for LVAD equipment.  LVAD equipment check completed and is in good working order. Back-up equipment present.  Exit Site Care: Drive line is being maintained weekly by daughter. CDI today. Drive line anchor placed using alternate anchor. Pt verbalized understanding of same. Provided patient with 8 weekly dressing kits for home use.  Significant Events on VAD Support: -10/2016>> poss drive line infection, CT ABD neg, ID consult-doxy -11/09/16>> admit for poss drive line infection, IV abx -03/24/17>> doxy for poss drive line infection -05/04/17>> drive line debridement with wound-vac -06/2017>> drive line +proteus, IV abx, Bactrim x14 days  Device:St jude Dual Therapies: on 200 bpm Last check: 01/01/18 - 2 hrs Afl/Afib; EMI 04/02/18  BP & Labs:  Doppler BP 124 - is reflecting modified systolic.  Hgb 11.7 difficult venous access. Result  from dialysis labs 11/18/20 - No S/S of bleeding. Specifically denies melena/BRBPR or nosebleeds.  LDH 170, difficult venous access. Results from dialysis labs 11/18/20 - Denies tea-colored urine. No power elevations noted on interrogation.     Patient Instructions:  1. STOP Amiodarone 2. START Hydralazine 25 mg three times a day on NON dialysis days only 3. We have provided you with a script for Nitro tabs -  you may take this only if you need it for chest pain 4. Return to clinic in 2 months  [name] RN [place] Coordinator  Office: [phone]  24/7 Pager: [phone]    Cardiology: Dr. Aundra Dubin  HPI: 55 y.o. with history of CAD s/p anterior MI in 2010 and NSTEMI in 3/18 with DES to pRCA and mLAD and ischemic cardiomyopathy now s/p Heartmate 3 LVAD and ESRD presents for followup of CHF/LVAD.  Patient developed  low output heart failure.  He was not deemed to be a good transplant candidate.  He was admitted in 56/18 and Heartmate 3 LVAD was placed.  He had baseline CKD stage 3 likely from diabetic nephropathy and CHF.  He developed peri-op AKI and ended up on dialysis.  No renal recovery to date.  St Jude ICD has been turned back on and ramp echo was done prior to discharge in 10/18, speed increased to 5600 rpm.   He was admitted in 11/18 with nausea/vomiting and abdominal pain.  This appears to have been due to diabetic gastroparesis (extensive GI evaluation). He had atrial flutter this admission that was converted to NSR with amiodarone.  He improved considerably with scheduled Reglan and erythromycin. He was re-admitted later in 11/18 with the same symptoms, suspect diabetic gastroparesis and was again treated with erythromycin.  He was not able to start domperidone due to prolonged QT interval. He had nausea again about a week ago but was able to manage at home with Phenergan suppositories.   Admitted 1/8 -> 09/16/17 for uncontrolled vomiting in setting of diabetic gastroparesis. Improved with phenergan suppositories and given erythromycin TID for 56 days post-discharge.    He was admitted in 2/19 for AV graft placement.  While in the hospital, we turned down his speed.  Since then, he has had considerably fewer PI events on HD days.    He was admitted in 4/19 with nausea/vomiting again, probably gastroparesis flare.   He was admitted in 5/19 with nausea/vomiting, likely gastroparesis flare, treated with erythromycin course which he is finishing.   He has been evaluated at [facility] by Dr. Derrill Kay (gastroparesis specialist).   Gastric emptying study, surprisingly, was near normal. However, electrogastrogram showed poor gastric accomodation/capacity, "bradygastria."  Frequent small meals were recommended. Also, early am nausea was thought to be possible GERD.    Admitted 6/26 - 03/19/18 for emergent VAD  exchange 03/01/18 in the setting of HM3 outflow graft kink. He required CVVHD post op, but was transtioned back to [facility] without problems. Course complicated by leukocytosis and fever. CT abd/chest negative for infection and blood cultures were negative. Post op pain well controlled withraretramadol. Able to tolerate diet. Evaluated by PT/OT and HH PT recommended for discharge. Ramp echo completed prior to discharge with speed optimization. Resumed81 mg ASA + coumadin with INR goal 2-3.  He was admitted 03/23/18 with nausea/gastroparesis symptoms and marked HTN at outpatient HD.  Erythromycin was restarted and gastroparesis symptoms were controlled with fall in BP and discharge on 03/25/18.   He was admitted in 9/19 for LUE AV fistula placement.  He was readmitted in 10/19 with his typical gastroparesis symptoms, unable to take po. The fistula later failed and was replaced with a graft.  However, the graft was nonfunctional and developed a hematoma at the surgical site.    He was admitted in 12/19 with nausea/vomiting from HD. Also with LDH increased to 427 in setting of extensive hematoma around his left arm AV graft.  He has had  tingling in his left hand and left grip is weak.    In 7/20, he was admitted with ischemic 4th toe on right foot.  Peripheral angiogram 03/18/19 showed 80% right SFA stenosis and occluded right PT. He had stenting of SFA and PTCA of PT with restoration of flow. However, he still needed amputation right 4th toe on 7/16 with intractable pain.  He was readmitted with pain in his right foot, nausea, and vomiting. There was concern for ongoing right foot ischemia with all toes and a portion of the foot now dusky.  He was treated with IV vancomycin for possible osteomyelitis/infection, but thought that more likely ischemia could explain all the findings.  He was discharged home to followup with vascular surgery.   He was re-admitted in 8/20 with ongoing right foot pain and ended up  having transmetatarsal amputation of the right foot.  Left leg angiogram was done with DES to left SFA, only 1 vessel runoff via severely diseased peroneal artery, unable to intervene.   He was readmitted in 9/20 with ongoing right foot/lower leg pain and had right AKA.  He was discharged to [facility].    He was then admitted with gangrene in the remnant of his left foot in 11/20.  He had left AKA.    He returns today for followup of LVAD.  He has been stable recently. No episodes of abdominal pain/vomiting (used to have a lot of trouble with gastroparesis).  Tolerating HD, no low flows or lightheadedness.  MAP is high on non-HD days.  He had an episode of central chest pain on Sunday that resolved in a few minutes.  No dyspnea with transfers.    Reports taking Coumadin as prescribed and adherence to anticoagulation based dietary restrictions. Denies BRBPR or melena. No dark urine or hematuria.   Labs (11/18): hgb 10.4 => 8.7, LDH 213 Labs (08/04/17): hgb 9 Labs (1/19): HgbA1c 6.1 Labs (2/19): hgb 13.2 Labs (3/19): hgb 11.1 Labs (7/19): hgb 9.6 Labs (10/19: hgb 11.8 Labs (12/19): LDH 427 => 237, hgb 7.7 Labs (11/20): hgb 8.1 Labs (1/21): HIV negative, RPR negative Labs (3/21): hgb 8.8 Labs (6/21): hgb 12.4 Labs (8/21): hgb 10.8, LDH 201 Labs (10/21): hgb 12.4, LDH 220 Labs (12/21): hgb 12, LDH 228 Labs (3/22): hgb 11.7  PMH: 1. Chronic systolic CHF: Ischemic cardiomyopathy. St Jude ICD.  - Echo (11/17) with EF 25-30%, moderate diastolic dysfunction, mild MR, mildly dilated RV with moderately decreased systolic function, peak RV-RA gradient 48 mmHg.  - Echo (3/18) with EF 25-30%, wall motion abnormalities, normal RV size and systolic function.  - Admission 3/18 with cardiogenic shock and AKI, required milrinone gtt.  - Echo (8/18) with EF 20-25%, mild LVH, moderate LV dilation, mild MR.  - CPX (8/18) with peak VO2 12.4, VE/VCO2 slope 35, RER 1.05 => severe HR limitation.  -  Heartmate 3 LVAD 10/18.  - Pump thrombosis in setting of HM3 outflow graft kink.  Pump exchange 6/19 for HM3.  2. CAD: Anterior MI 2005, had bifurcation stenting LAD/diagonal. Repeat cath 2010 with patent stents.  - NSTEMI 3/18: LHC with 99% ulcerated lesion proximal RCA, 95% mid LAD => PCI with DES to proximal RCA and mid LAD.  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  - Lower extremity venous dopplers (8/18): chronic DVT - V/Q scan (8/18): Normal, no PE.  4. Crohns disease: History of colectomy.  5. GERD 6. Diabetic gastroparesis: Admission 11/18 with abdominal pain, nausea/vomiting.  - [facility] workup 2019: Gastric emptying  study near normal.  Negative breath test (no evidence for small bowel bacterial overgrowth).  Electrogastrogram showed poor gastric accomodation/capacity, "bradygastria."  7. HTN 8. Hyperlipidemia 9. Type II diabetes  10. ESRD: Post-op LVAD surgery.  He has had failed AV fistula and AV graft, now dialyzing via catheter . 11. Carotid dopplers (8/18): mild bilateral stenosis.  12. PFTs (8/18): Mild restriction (no IDL on CT chest).  13. Lung nodules: CT chest 8/18 with small bilateral nodules.  14. PAD: ABIs with moderate bilateral disease in 8/18.  - Peripheral angiogram 03/18/19 showed 80% right SFA stenosis and occluded right PT. He had stenting of SFA and PTCA of PT with restoration of flow. However, he still needed amputation right 4th toe on 03/21/19 with intractable pain.  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  - Peripheral angiogram left leg: DES to left SFA, only 1 vessel runoff via severely diseased peroneal artery, unable to intervene. 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  - Left AKA 11/20.  15. Atrial flutter: In 11/18 associated with nausea/vomiting from gastroparesis.   Current Outpatient Medications  Medication Sig Dispense Refill  . hydrALAZINE (APRESOLINE) 25 MG tablet Take 1 tablet three times a day on NON-dialysis days only 270 tablet 3  . nitroGLYCERIN (NITROSTAT) 0.4 MG SL  tablet Place 1 tablet (0.4 mg total) under the tongue every 5 (five) minutes as needed for chest pain. 90 tablet 3  . aspirin EC 81 MG tablet Take 1 tablet (81 mg total) by mouth daily. 90 tablet 3  . atorvastatin (LIPITOR) 40 MG tablet Take 1 tablet (40 mg total) by mouth daily at 6 PM. 90 tablet 3  . calcitRIOL (ROCALTROL) 0.5 MCG capsule Take 1 capsule (0.5 mcg total) by mouth every Monday, Wednesday, and Friday. 15 capsule 5  . citalopram (CELEXA) 20 MG tablet Take 1 tablet (20 mg total) by mouth daily. 30 tablet 6  . docusate sodium (COLACE) 100 MG capsule Take by mouth as needed.    . insulin glargine (LANTUS) 100 UNIT/ML injection Inject 0.35 mLs (35 Units total) into the skin daily. 10 mL 3  . LORazepam (ATIVAN) 0.5 MG tablet Take 1 tablet (0.5 mg total) by mouth every 12 (twelve) hours as needed (nausea). 60 tablet 0  . metoCLOPramide (REGLAN) 10 MG tablet Take 1 tablet (10 mg total) by mouth 3 (three) times daily. (Patient taking differently: Take 10 mg by mouth 3 (three) times daily before meals.) 90 tablet 6  . midodrine (PROAMATINE) 5 MG tablet Take 3 tablets (15 mg total) by mouth every Monday, Wednesday, and Friday. Take 1 tablet before dialysis treatment 60 tablet 6  . multivitamin (RENA-VIT) TABS tablet Take 1 tablet by mouth daily.    . ondansetron (ZOFRAN-ODT) 4 MG disintegrating tablet Take by mouth as needed.    [name] oxyCODONE (ROXICODONE) 5 MG immediate release tablet Take 1 tablet (5 mg total) by mouth 2 (two) times daily as needed for severe pain. Take 1-2 tablets every 4-6 hours for severe pain. 30 tablet 0  . pantoprazole (PROTONIX) 40 MG tablet Take 1 tablet (40 mg total) by mouth 2 (two) times daily before lunch and supper. 60 tablet 11  . promethazine (PHENERGAN) 12.5 MG tablet TAKE 1 TABLET (12.5 MG TOTAL) BY MOUTH EVERY 6 (SIX) HOURS AS NEEDED FOR NAUSEA OR VOMITING. 30 tablet 3  . sevelamer carbonate (RENVELA) 0.8 g PACK packet Take 0.8 g by mouth 3 (three) times daily  with meals. 270 each 1  . warfarin (COUMADIN) 5 MG tablet  Take 1 tablet (5 mg total) by mouth daily. 30 tablet 11   No current facility-administered medications for this encounter.    Metformin and related   Review of systems complete and found to be negative unless listed in HPI.    BP (!) 124/0   Pulse 99   SpO2 97%   MAP 95  VAD interrogation & Equipment Management: See LVAD nurse's note above.   General: Well appearing this am. NAD.  HEENT: Normal. Neck: Supple, JVP 7-8 cm. Carotids OK.  Cardiac:  Mechanical heart sounds with LVAD hum present.  Lungs:  CTAB, normal effort.  Abdomen:  NT, ND, no HSM. No bruits or masses. +BS  LVAD exit site: Well-healed and incorporated. Dressing dry and intact. No erythema or drainage. Stabilization device present and accurately applied. Driveline dressing changed daily per sterile technique. Extremities:  S/p bilateral AKA.  Neuro:  Alert & oriented x 3. Cranial nerves grossly intact. Moves all 4 extremities w/o difficulty. Affect pleasant    ASSESSMENT AND PLAN: 1. PAD/ischemic feet:  He is now s/p bilateral AKAs.  Coping reasonably well, able to transfer from bed to wheelchair on his own.  2. Chronic systolic CHF: Ischemic cardiomyopathy. St Jude ICD. NSTEMI in March 2018 with DES to LAD and RCA, complicated by cardiogenic shock, low output requiring milrinone. Echo 8/18 with EF 20-25%, moderate MR. CPX (8/18) with severe functional impairment due to HF. He is s/p HM3 LVAD placement. He had pump thrombosis 6/19 due to [facility] outflow graft kinking. He had pump exchange for another HM3.  LVAD parameters stable, no recent low flows with HD. He takes midodrine pre-HD, but MAP is elevated on non-HD days.  - Volume managed at dialysis, MWF.   - Continue ASA 81and warfarin with INR goal 2-2.5.    - He will start on hydralazine 25 mg tid on non-HD days.  - He had been referred to [facility] for [facility] for heart/kidney transplant, but he is not  candidate for transplantnowgiven extensive PAD. 3. ESRD: HD on M-W-F with midodrine pre-HD.  - Need to allow UF as long as MAP > 70 (would not stop for MAP 70-80).   - Continue pre-HD midodrine 15 mg.  4.CAD: NSTEMI in 3/18. LHC with 99% ulcerated lesion proximal RCA with left to right collaterals, 95% mid LAD stenosis after mid LAD stent. s/p PCI to RCA and LAD on 11/25/16.No chest pain.  - Continue statin and ASA. 5. h/o DVTs: Factor V Leiden heterozygote. -Anticoagulated. 6. Diabetic gastroparesis: He has seen gastroparesis specialist at [facility]. Surprisingly, gastric emptying study was normal. However, electrogastrogram showed poor gastric accomodation/capacity. Therefore, small frequent meals recommended.He has been doing much better with this, minimal symptoms.  - Continue reglan 10 mg tid/ac, now off Marinol.  - Continue Protonix bid.  - Limit narcotic use as much as possible.  7. HTN:MAP high on non-HD days, adding hydralazine 25 tid on non-HD days.  8. Chronic pain: Now seen in a pain clinic.  9. Atrial flutter: Paroxysmal.  Not seen recently.  - Stop amiodarone.   Followup in 2 months.   Sylvia Kondracki 11/24/2020

## 2020-11-24 NOTE — Patient Instructions (Signed)
1. STOP Amiodarone 2. START Hydralazine 25 mg three times a day on NON dialysis days only 3. We have provided you with a script for Nitro tabs - you may take this only if you need it for chest pain 4. Return to clinic in 2 months

## 2020-11-24 NOTE — Progress Notes (Signed)
LVAD INR 

## 2020-11-26 NOTE — Progress Notes (Signed)
Remote ICD transmission.   

## 2020-12-01 ENCOUNTER — Ambulatory Visit (HOSPITAL_COMMUNITY): Payer: Self-pay

## 2020-12-01 LAB — POCT INR: INR: 2.3 (ref 2.0–3.0)

## 2020-12-08 ENCOUNTER — Ambulatory Visit (HOSPITAL_COMMUNITY): Payer: Self-pay | Admitting: Pharmacist

## 2020-12-08 LAB — POCT INR: INR: 2.1 (ref 2.0–3.0)

## 2020-12-08 NOTE — Progress Notes (Signed)
LVAD INR 

## 2020-12-15 ENCOUNTER — Ambulatory Visit (HOSPITAL_COMMUNITY): Payer: Self-pay | Admitting: Pharmacist

## 2020-12-15 LAB — POCT INR: INR: 2.7 (ref 2.0–3.0)

## 2020-12-15 NOTE — Progress Notes (Signed)
LVAD INR 

## 2020-12-22 ENCOUNTER — Ambulatory Visit (HOSPITAL_COMMUNITY): Payer: Self-pay | Admitting: Pharmacist

## 2020-12-22 LAB — POCT INR: INR: 2.4 (ref 2.0–3.0)

## 2020-12-22 NOTE — Progress Notes (Signed)
LVAD INR 

## 2020-12-29 ENCOUNTER — Ambulatory Visit (HOSPITAL_COMMUNITY): Payer: Self-pay | Admitting: Pharmacist

## 2020-12-29 LAB — POCT INR: INR: 2.8 (ref 2.0–3.0)

## 2020-12-29 NOTE — Progress Notes (Signed)
LVAD INR 

## 2020-12-30 ENCOUNTER — Ambulatory Visit (HOSPITAL_COMMUNITY)
Admission: RE | Admit: 2020-12-30 | Discharge: 2020-12-30 | Disposition: A | Discharge status: Home / Self Care | Payer: Medicare Other | Source: Ambulatory Visit | Attending: Internal Medicine | Admitting: Internal Medicine

## 2020-12-30 ENCOUNTER — Other Ambulatory Visit: Payer: Self-pay

## 2020-12-30 DIAGNOSIS — Z95811 Presence of heart assist device: Secondary | ICD-10-CM | POA: Insufficient documentation

## 2020-12-30 NOTE — Progress Notes (Signed)
Received page from pt last night stating that his white lead is open and draining a green fluid. I assured pt that this will not affect his pump but that I would need to change his controller out. Pt arrived to clinic via [facility] today. The white lead was split open exposing the wires and was draining a green fluid. Pump was functioning as expected with no alarms. VAD coordinator changed the pts controller with no issues.   New controller: [id number]   I also checked the pts backup controller and found his back up battery was expired this was replaced with [id number].  We will f/u with the pt at his next visit.  [name] RN, BSN VAD Coordinator 24/7 Pager [phone]

## 2021-01-05 ENCOUNTER — Ambulatory Visit (HOSPITAL_COMMUNITY): Payer: Self-pay | Admitting: Pharmacist

## 2021-01-05 LAB — POCT INR: INR: 2.2 (ref 2.0–3.0)

## 2021-01-05 NOTE — Progress Notes (Signed)
LVAD INR 

## 2021-01-12 ENCOUNTER — Ambulatory Visit (HOSPITAL_COMMUNITY): Payer: Self-pay

## 2021-01-12 LAB — POCT INR: INR: 2.1 (ref 2.0–3.0)

## 2021-01-12 NOTE — Progress Notes (Signed)
LVAD INR 

## 2021-01-19 ENCOUNTER — Ambulatory Visit (HOSPITAL_COMMUNITY): Payer: Self-pay

## 2021-01-19 LAB — POCT INR: INR: 2.3 (ref 2.0–3.0)

## 2021-01-19 NOTE — Progress Notes (Signed)
LVAD INR 

## 2021-01-26 ENCOUNTER — Other Ambulatory Visit (HOSPITAL_COMMUNITY): Payer: Self-pay | Admitting: *Deleted

## 2021-01-26 ENCOUNTER — Ambulatory Visit (HOSPITAL_COMMUNITY)
Admission: RE | Admit: 2021-01-26 | Discharge: 2021-01-26 | Disposition: A | Discharge status: Home / Self Care | Payer: Medicare Other | Source: Ambulatory Visit | Attending: Cardiology | Admitting: Cardiology

## 2021-01-26 ENCOUNTER — Encounter (HOSPITAL_COMMUNITY): Payer: Self-pay

## 2021-01-26 ENCOUNTER — Other Ambulatory Visit: Payer: Self-pay

## 2021-01-26 ENCOUNTER — Ambulatory Visit (HOSPITAL_COMMUNITY): Payer: Self-pay

## 2021-01-26 VITALS — BP 124/85 | HR 94

## 2021-01-26 DIAGNOSIS — E785 Hyperlipidemia, unspecified: Secondary | ICD-10-CM | POA: Insufficient documentation

## 2021-01-26 DIAGNOSIS — Z992 Dependence on renal dialysis: Secondary | ICD-10-CM | POA: Insufficient documentation

## 2021-01-26 DIAGNOSIS — I255 Ischemic cardiomyopathy: Secondary | ICD-10-CM | POA: Diagnosis not present

## 2021-01-26 DIAGNOSIS — Z79899 Other long term (current) drug therapy: Secondary | ICD-10-CM | POA: Diagnosis not present

## 2021-01-26 DIAGNOSIS — D6851 Activated protein C resistance: Secondary | ICD-10-CM | POA: Diagnosis not present

## 2021-01-26 DIAGNOSIS — K5090 Crohn's disease, unspecified, without complications: Secondary | ICD-10-CM | POA: Insufficient documentation

## 2021-01-26 DIAGNOSIS — K219 Gastro-esophageal reflux disease without esophagitis: Secondary | ICD-10-CM | POA: Diagnosis not present

## 2021-01-26 DIAGNOSIS — I5022 Chronic systolic (congestive) heart failure: Secondary | ICD-10-CM

## 2021-01-26 DIAGNOSIS — Z955 Presence of coronary angioplasty implant and graft: Secondary | ICD-10-CM | POA: Diagnosis not present

## 2021-01-26 DIAGNOSIS — I132 Hypertensive heart and chronic kidney disease with heart failure and with stage 5 chronic kidney disease, or end stage renal disease: Secondary | ICD-10-CM | POA: Diagnosis not present

## 2021-01-26 DIAGNOSIS — I252 Old myocardial infarction: Secondary | ICD-10-CM | POA: Insufficient documentation

## 2021-01-26 DIAGNOSIS — I4892 Unspecified atrial flutter: Secondary | ICD-10-CM | POA: Insufficient documentation

## 2021-01-26 DIAGNOSIS — Z7982 Long term (current) use of aspirin: Secondary | ICD-10-CM | POA: Insufficient documentation

## 2021-01-26 DIAGNOSIS — I2510 Atherosclerotic heart disease of native coronary artery without angina pectoris: Secondary | ICD-10-CM | POA: Insufficient documentation

## 2021-01-26 DIAGNOSIS — Z95811 Presence of heart assist device: Secondary | ICD-10-CM | POA: Diagnosis present

## 2021-01-26 DIAGNOSIS — Z794 Long term (current) use of insulin: Secondary | ICD-10-CM | POA: Diagnosis not present

## 2021-01-26 DIAGNOSIS — Z89612 Acquired absence of left leg above knee: Secondary | ICD-10-CM | POA: Insufficient documentation

## 2021-01-26 DIAGNOSIS — Z89611 Acquired absence of right leg above knee: Secondary | ICD-10-CM | POA: Diagnosis not present

## 2021-01-26 DIAGNOSIS — Z86718 Personal history of other venous thrombosis and embolism: Secondary | ICD-10-CM | POA: Insufficient documentation

## 2021-01-26 DIAGNOSIS — N186 End stage renal disease: Secondary | ICD-10-CM | POA: Diagnosis not present

## 2021-01-26 DIAGNOSIS — Z7901 Long term (current) use of anticoagulants: Secondary | ICD-10-CM | POA: Insufficient documentation

## 2021-01-26 DIAGNOSIS — E1143 Type 2 diabetes mellitus with diabetic autonomic (poly)neuropathy: Secondary | ICD-10-CM | POA: Insufficient documentation

## 2021-01-26 DIAGNOSIS — Z9049 Acquired absence of other specified parts of digestive tract: Secondary | ICD-10-CM | POA: Insufficient documentation

## 2021-01-26 DIAGNOSIS — K3184 Gastroparesis: Secondary | ICD-10-CM | POA: Insufficient documentation

## 2021-01-26 DIAGNOSIS — E1122 Type 2 diabetes mellitus with diabetic chronic kidney disease: Secondary | ICD-10-CM | POA: Insufficient documentation

## 2021-01-26 LAB — POCT INR: INR: 2.4 (ref 2.0–3.0)

## 2021-01-26 NOTE — Progress Notes (Signed)
LVAD INR 

## 2021-01-26 NOTE — Patient Instructions (Addendum)
1. No medication changes today 2. Coumadin dosing per Ander Purpura PharmD 3. Return for VAD clinic follow up in 2 months. We will complete your 3 year Intermacs and annual equipment maintenance. Please bring your battery charger, mobile power unit, batteries, and backup bag.  4. We scheduled you for an echo prior to seeing Dr Aundra Dubin at your next appointment. (echo at 0900, see Dr Aundra Dubin at 10:00)

## 2021-01-26 NOTE — Progress Notes (Addendum)
Patient presents for 2 month follow up visit follow up in [facility] today with his friend. Reports no problems with VAD equipment or concerns with drive line.   Pt is very pleasant today and states that he feels great. States that dialysis is going well, and that he has occasional low low flows during dialysis, but it has been a while. Denies lightheadedness, dizziness, chest pain, or signs of bleeding. Reports he has had a few falls (he accidentally turned over his new wheelchair during a transfer at home) with no injuries sustained.   Pt stopped Amiodarone as ordered at last clinic visit. He has been taking Hydralazine 25 mg TID on NON-DIALYSIS days as ordered at last clinic visit. He has not taken medications yet this morning. See vitals documentation below. No medication changes today per Dr Aundra Dubin.   Pt states that his nausea has been much better lately and that he has not taken any phenergan or Zofran since 2021. Reports he is eating 1 meal a day. He is "uncomfortable" if he eats much more than that. He reports he is also scared he will gain weight if he eats more meals a day because his mobility is limited, but he states he would like to eat more than once. Discussed healthy snack options if he is hungry outside of his main meal.    Labs received from dialysis were drawn on 01/13/21. Reviewed with Dr Aundra Dubin.   Vital Signs:  Doppler Pressure  112 Automatc BP: 124/85 (85) HR: 94 SPO2: 98 % on RA  Weight: UTO lb w/o eqt Last weight: 200 lb   VAD Indication: Destination therapy- Pt is no longer candidate for heart/kidney transplant due to PAD per [facility].   VAD interrogation & Equipment Management: Speed: 5500 Flow: 3.8 Power: 4.6w    PI: 5.3  Alarms: none Events: 100+ yesterday at dialysis  Fixed speed 5500 Low speed limit: 5200  Primary Controller: Replace back up battery in 33 months. Back up controller:   Replace back up battery in 7 months  Annual Equipment Maintenance  on UBC/PM was performed on 02/2020.   I reviewed the LVAD parameters from today and compared the results to the patient's prior recorded data. LVAD interrogation was NEGATIVE for significant power changes, NEGATIVE for clinical alarms and STABLE for PI events/speed drops. No programming changes were made and pump is functioning within specified parameters. Pt is performing daily controller and system monitor self tests along with completing weekly and monthly maintenance for LVAD equipment.  LVAD equipment check completed and is in good working order. Back-up equipment present.  Exit Site Care: Drive line is being maintained weekly by daughter. CDI today. Drive line anchor placed using alternate anchor. Pt verbalized understanding of same. Provided patient with 8 weekly dressing kits for home use.  Significant Events on VAD Support: -10/2016>> poss drive line infection, CT ABD neg, ID consult-doxy -11/09/16>> admit for poss drive line infection, IV abx -03/24/17>> doxy for poss drive line infection -05/04/17>> drive line debridement with wound-vac -06/2017>> drive line +proteus, IV abx, Bactrim x14 days  Device:St jude Dual Therapies: on 200 bpm Last check: 01/01/18 - 2 hrs Afl/Afib; EMI 04/02/18  BP & Labs:  Doppler BP 112 - is reflecting modified systolic.  Hgb 10.7 difficult venous access. Result  from dialysis labs 01/13/21 - No S/S of bleeding. Specifically denies melena/BRBPR or nosebleeds.  LDH 180, difficult venous access. Results from dialysis labs 01/13/21 - Denies tea-colored urine. No power elevations noted on interrogation.  Patient Instructions:  1. No medication changes today 2. Coumadin dosing per Ander Purpura PharmD 3. Return for VAD clinic follow up in 2 months. We will complete your 3 year Intermacs and annual equipment maintenance. Please bring your battery charger, mobile power unit, batteries, and backup bag.  4. We scheduled you for an echo prior to seeing Dr Aundra Dubin  at your next appointment. (echo at 0900, see Dr Aundra Dubin at 10:00)   [name] RN [place] Coordinator  Office: [phone]  24/7 Pager: [phone]    Cardiology: Dr. Aundra Dubin  HPI: 55 y.o. with history of CAD s/p anterior MI in 2010 and NSTEMI in 3/18 with DES to pRCA and mLAD and ischemic cardiomyopathy now s/p Heartmate 3 LVAD and ESRD presents for followup of CHF/LVAD.  Patient developed low output heart failure.  He was not deemed to be a good transplant candidate.  He was admitted in 10/18 and Heartmate 3 LVAD was placed.  He had baseline CKD stage 3 likely from diabetic nephropathy and CHF.  He developed peri-op AKI and ended up on dialysis.  No renal recovery to date.  St Jude ICD has been turned back on and ramp echo was done prior to discharge in 10/18, speed increased to 5600 rpm.   He was admitted in 11/18 with nausea/vomiting and abdominal pain.  This appears to have been due to diabetic gastroparesis (extensive GI evaluation). He had atrial flutter this admission that was converted to NSR with amiodarone.  He improved considerably with scheduled Reglan and erythromycin. He was re-admitted later in 11/18 with the same symptoms, suspect diabetic gastroparesis and was again treated with erythromycin.  He was not able to start domperidone due to prolonged QT interval. He had nausea again about a week ago but was able to manage at home with Phenergan suppositories.   Admitted 1/8 -> 09/16/17 for uncontrolled vomiting in setting of diabetic gastroparesis. Improved with phenergan suppositories and given erythromycin TID for 7 days post-discharge.    He was admitted in 2/19 for AV graft placement.  While in the hospital, we turned down his speed.  Since then, he has had considerably fewer PI events on HD days.    He was admitted in 4/19 with nausea/vomiting again, probably gastroparesis flare.   He was admitted in 5/19 with nausea/vomiting, likely gastroparesis flare, treated with  erythromycin course which he is finishing.   He has been evaluated at [facility] by Dr. Derrill Kay (gastroparesis specialist).   Gastric emptying study, surprisingly, was near normal. However, electrogastrogram showed poor gastric accomodation/capacity, "bradygastria."  Frequent small meals were recommended. Also, early am nausea was thought to be possible GERD.    Admitted 6/26 - 03/19/18 for emergent VAD exchange 03/01/18 in the setting of HM3 outflow graft kink. He required CVVHD post op, but was transtioned back to [facility] without problems. Course complicated by leukocytosis and fever. CT abd/chest negative for infection and blood cultures were negative. Post op pain well controlled withraretramadol. Able to tolerate diet. Evaluated by PT/OT and HH PT recommended for discharge. Ramp echo completed prior to discharge with speed optimization. Resumed81 mg ASA + coumadin with INR goal 2-3.  He was admitted 03/23/18 with nausea/gastroparesis symptoms and marked HTN at outpatient HD.  Erythromycin was restarted and gastroparesis symptoms were controlled with fall in BP and discharge on 03/25/18.   He was admitted in 9/19 for LUE AV fistula placement.  He was readmitted in 10/19 with his typical gastroparesis symptoms, unable to take po. The fistula later  failed and was replaced with a graft.  However, the graft was nonfunctional and developed a hematoma at the surgical site.    He was admitted in 12/19 with nausea/vomiting from HD. Also with LDH increased to 427 in setting of extensive hematoma around his left arm AV graft.  He has had tingling in his left hand and left grip is weak.    In 7/20, he was admitted with ischemic 4th toe on right foot.  Peripheral angiogram 03/18/19 showed 80% right SFA stenosis and occluded right PT. He had stenting of SFA and PTCA of PT with restoration of flow. However, he still needed amputation right 4th toe on 7/16 with intractable pain.  He was readmitted with pain in his right  foot, nausea, and vomiting. There was concern for ongoing right foot ischemia with all toes and a portion of the foot now dusky.  He was treated with IV vancomycin for possible osteomyelitis/infection, but thought that more likely ischemia could explain all the findings.  He was discharged home to followup with vascular surgery.   He was re-admitted in 8/20 with ongoing right foot pain and ended up having transmetatarsal amputation of the right foot.  Left leg angiogram was done with DES to left SFA, only 1 vessel runoff via severely diseased peroneal artery, unable to intervene.   He was readmitted in 9/20 with ongoing right foot/lower leg pain and had right AKA.  He was discharged to [facility].    He was then admitted with gangrene in the remnant of his left foot in 11/20.  He had left AKA.    He returns today for followup of LVAD.  He has been stable recently.  Able to make transfers back and forth to wheelchair without dyspnea.  Rare nausea, usually only if he eats "too much."  No lightheadedness.  No orthopnea/PND.  MAP better, 97 today but has not taken his hydralazine yet.  No problems at HD.   Reports taking Coumadin as prescribed and adherence to anticoagulation based dietary restrictions. Denies BRBPR or melena. No dark urine or hematuria.   Labs (11/18): hgb 10.4 => 8.7, LDH 213 Labs (08/04/17): hgb 9 Labs (1/19): HgbA1c 6.1 Labs (2/19): hgb 13.2 Labs (3/19): hgb 11.1 Labs (7/19): hgb 9.6 Labs (10/19: hgb 11.8 Labs (12/19): LDH 427 => 237, hgb 7.7 Labs (11/20): hgb 8.1 Labs (1/21): HIV negative, RPR negative Labs (3/21): hgb 8.8 Labs (6/21): hgb 12.4 Labs (8/21): hgb 10.8, LDH 201 Labs (10/21): hgb 12.4, LDH 220 Labs (12/21): hgb 12, LDH 228 Labs (3/22): hgb 11.7 Labs (5/22): hgb 10.7, LDH 180  PMH: 1. Chronic systolic CHF: Ischemic cardiomyopathy. St Jude ICD.  - Echo (11/17) with EF 25-30%, moderate diastolic dysfunction, mild MR, mildly dilated RV with moderately  decreased systolic function, peak RV-RA gradient 48 mmHg.  - Echo (3/18) with EF 25-30%, wall motion abnormalities, normal RV size and systolic function.  - Admission 3/18 with cardiogenic shock and AKI, required milrinone gtt.  - Echo (8/18) with EF 20-25%, mild LVH, moderate LV dilation, mild MR.  - CPX (8/18) with peak VO2 12.4, VE/VCO2 slope 35, RER 1.05 => severe HR limitation.  - Heartmate 3 LVAD 10/18.  - Pump thrombosis in setting of HM3 outflow graft kink.  Pump exchange 6/19 for HM3.  2. CAD: Anterior MI 2005, had bifurcation stenting LAD/diagonal. Repeat cath 2010 with patent stents.  - NSTEMI 3/18: LHC with 99% ulcerated lesion proximal RCA, 95% mid LAD => PCI with DES to  proximal RCA and mid LAD.  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  - Lower extremity venous dopplers (8/18): chronic DVT - V/Q scan (8/18): Normal, no PE.  4. Crohns disease: History of colectomy.  5. GERD 6. Diabetic gastroparesis: Admission 11/18 with abdominal pain, nausea/vomiting.  - [facility] workup 2019: Gastric emptying study near normal.  Negative breath test (no evidence for small bowel bacterial overgrowth).  Electrogastrogram showed poor gastric accomodation/capacity, "bradygastria."  7. HTN 8. Hyperlipidemia 9. Type II diabetes  10. ESRD: Post-op LVAD surgery.  He has had failed AV fistula and AV graft, now dialyzing via catheter . 11. Carotid dopplers (8/18): mild bilateral stenosis.  12. PFTs (8/18): Mild restriction (no IDL on CT chest).  13. Lung nodules: CT chest 8/18 with small bilateral nodules.  14. PAD: ABIs with moderate bilateral disease in 8/18.  - Peripheral angiogram 03/18/19 showed 80% right SFA stenosis and occluded right PT. He had stenting of SFA and PTCA of PT with restoration of flow. However, he still needed amputation right 4th toe on 03/21/19 with intractable pain.  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  - Peripheral angiogram left leg: DES to left SFA, only 1 vessel runoff via  severely diseased peroneal artery, unable to intervene. 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  - Left AKA 11/20.  15. Atrial flutter: In 11/18 associated with nausea/vomiting from gastroparesis.   Current Outpatient Medications  Medication Sig Dispense Refill  . aspirin EC 81 MG tablet Take 1 tablet (81 mg total) by mouth daily. 90 tablet 3  . atorvastatin (LIPITOR) 40 MG tablet Take 1 tablet (40 mg total) by mouth daily at 6 PM. 90 tablet 3  . calcitRIOL (ROCALTROL) 0.5 MCG capsule Take 1 capsule (0.5 mcg total) by mouth every Monday, Wednesday, and Friday. 15 capsule 5  . citalopram (CELEXA) 20 MG tablet Take 1 tablet (20 mg total) by mouth daily. 30 tablet 6  . docusate sodium (COLACE) 100 MG capsule Take by mouth as needed.    . hydrALAZINE (APRESOLINE) 25 MG tablet Take 1 tablet three times a day on NON-dialysis days only 270 tablet 3  . insulin glargine (LANTUS) 100 UNIT/ML injection Inject 0.35 mLs (35 Units total) into the skin daily. 10 mL 3  . LORazepam (ATIVAN) 0.5 MG tablet Take 1 tablet (0.5 mg total) by mouth every 12 (twelve) hours as needed (nausea). 60 tablet 0  . metoCLOPramide (REGLAN) 10 MG tablet Take 1 tablet (10 mg total) by mouth 3 (three) times daily. (Patient taking differently: Take 10 mg by mouth 3 (three) times daily before meals.) 90 tablet 6  . midodrine (PROAMATINE) 5 MG tablet Take 3 tablets (15 mg total) by mouth every Monday, Wednesday, and Friday. Take 1 tablet before dialysis treatment 60 tablet 6  . multivitamin (RENA-VIT) TABS tablet Take 1 tablet by mouth daily.    [name] oxyCODONE (ROXICODONE) 5 MG immediate release tablet Take 1 tablet (5 mg total) by mouth 2 (two) times daily as needed for severe pain. Take 1-2 tablets every 4-6 hours for severe pain. 30 tablet 0  . pantoprazole (PROTONIX) 40 MG tablet Take 1 tablet (40 mg total) by mouth 2 (two) times daily before lunch and supper. 60 tablet 11  . sevelamer carbonate (RENVELA) 0.8 g PACK packet Take 0.8 g by mouth 3 (three)  times daily with meals. 270 each 1  . warfarin (COUMADIN) 5 MG tablet Take 1 tablet (5 mg total) by mouth daily. 30 tablet 11  . nitroGLYCERIN (NITROSTAT) 0.4 MG SL  tablet Place 1 tablet (0.4 mg total) under the tongue every 5 (five) minutes as needed for chest pain. (Patient not taking: Reported on 01/26/2021) 90 tablet 3  . ondansetron (ZOFRAN-ODT) 4 MG disintegrating tablet Take by mouth as needed. (Patient not taking: Reported on 01/26/2021)    . promethazine (PHENERGAN) 12.5 MG tablet TAKE 1 TABLET (12.5 MG TOTAL) BY MOUTH EVERY 6 (SIX) HOURS AS NEEDED FOR NAUSEA OR VOMITING. (Patient not taking: Reported on 01/26/2021) 30 tablet 3   No current facility-administered medications for this encounter.    Metformin and related   Review of systems complete and found to be negative unless listed in HPI.    BP 124/85 Comment: map 97  Pulse 94   SpO2 98%   MAP 95  VAD interrogation & Equipment Management: See LVAD nurse's note above.   General: Well appearing this am. NAD.  HEENT: Normal. Neck: Supple, JVP 7-8 cm. Carotids OK.  Cardiac:  Mechanical heart sounds with LVAD hum present.  Lungs:  CTAB, normal effort.  Abdomen:  NT, ND, no HSM. No bruits or masses. +BS  LVAD exit site: Well-healed and incorporated. Dressing dry and intact. No erythema or drainage. Stabilization device present and accurately applied. Driveline dressing changed daily per sterile technique. Extremities:  S/p bilateral AKAs Neuro:  Alert & oriented x 3. Cranial nerves grossly intact. Moves all 4 extremities w/o difficulty. Affect pleasant     ASSESSMENT AND PLAN: 1. PAD/ischemic feet:  He is now s/p bilateral AKAs.  Coping well, able to transfer from bed to wheelchair on his own.  2. Chronic systolic CHF: Ischemic cardiomyopathy. St Jude ICD. NSTEMI in March 2018 with DES to LAD and RCA, complicated by cardiogenic shock, low output requiring milrinone. Echo 8/18 with EF 20-25%, moderate MR. CPX (8/18) with  severe functional impairment due to HF. He is s/p HM3 LVAD placement. He had pump thrombosis 6/19 due to [facility] outflow graft kinking. He had pump exchange for another HM3.  LVAD parameters stable, no recent low flows with HD. He takes midodrine pre-HD, but MAP now better-controlled on non-HD days with hydralazine.  - Volume managed at dialysis, MWF.   - Continue ASA 81and warfarin with INR goal 2-2.5.    - Continue hydralazine 25 mg tid on non-HD days.  - He had been referred to [facility] for evaluation for heart/kidney transplant, but he is not candidate for transplantnowgiven extensive PAD. 3. ESRD: HD on M-W-F with midodrine pre-HD.  - Need to allow UF as long as MAP > 70 (would not stop for MAP 70-80).   - Continue pre-HD midodrine 15 mg.  4.CAD: NSTEMI in 3/18. LHC with 99% ulcerated lesion proximal RCA with left to right collaterals, 95% mid LAD stenosis after mid LAD stent. s/p PCI to RCA and LAD on 11/25/16.No chest pain.  - Continue statin and ASA. 5. h/o DVTs: Factor V Leiden heterozygote. -Anticoagulated. 6. Diabetic gastroparesis: He has seen gastroparesis specialist at [facility]. Surprisingly, gastric emptying study was normal. However, electrogastrogram showed poor gastric accomodation/capacity. Therefore, small frequent meals recommended.He has been doing much better with this, minimal symptoms.  - Continue reglan 10 mg tid/ac, now off Marinol.  - Continue Protonix bid.  - Limit narcotic use as much as possible.  7. HTN:MAP better, takes hydralazine 25 tid on non-HD days.  8. Chronic pain: Now seen in a pain clinic.  9. Atrial flutter: Paroxysmal.  Not seen recently. Now off amiodarone.   Followup in 2 months.  [name] 01/26/2021

## 2021-01-28 NOTE — Addendum Note (Signed)
Encounter addended by: Louann Liv, LCSW on: 01/28/2021 12:21 PM  Actions taken: Clinical Note Signed

## 2021-01-28 NOTE — Progress Notes (Signed)
CSW met with patient in the clinic. CSW encouraged patient and daughter to attend LVAD Support Group today. Patient and daughter open and plan to attend the group. Patient denies any other CSW needs at this time. Raquel Sarna, [place], [place]

## 2021-02-01 ENCOUNTER — Other Ambulatory Visit: Payer: Self-pay | Admitting: Student in an Organized Health Care Education/Training Program

## 2021-02-01 DIAGNOSIS — E118 Type 2 diabetes mellitus with unspecified complications: Secondary | ICD-10-CM

## 2021-02-01 DIAGNOSIS — Z794 Long term (current) use of insulin: Secondary | ICD-10-CM

## 2021-02-02 ENCOUNTER — Ambulatory Visit (HOSPITAL_COMMUNITY): Payer: Self-pay

## 2021-02-02 LAB — POCT INR: INR: 2.1 (ref 2.0–3.0)

## 2021-02-02 NOTE — Progress Notes (Signed)
LVAD INR 

## 2021-02-03 ENCOUNTER — Other Ambulatory Visit: Payer: Self-pay | Admitting: Student in an Organized Health Care Education/Training Program

## 2021-02-09 ENCOUNTER — Ambulatory Visit (HOSPITAL_COMMUNITY): Payer: Self-pay

## 2021-02-09 LAB — POCT INR: INR: 2.4 (ref 2.0–3.0)

## 2021-02-09 NOTE — Progress Notes (Signed)
LVAD INR 

## 2021-02-16 ENCOUNTER — Ambulatory Visit (INDEPENDENT_AMBULATORY_CARE_PROVIDER_SITE_OTHER): Payer: Medicare Other

## 2021-02-16 ENCOUNTER — Ambulatory Visit (HOSPITAL_COMMUNITY): Payer: Self-pay

## 2021-02-16 DIAGNOSIS — I255 Ischemic cardiomyopathy: Secondary | ICD-10-CM

## 2021-02-16 LAB — POCT INR: INR: 2.1 (ref 2.0–3.0)

## 2021-02-16 NOTE — Progress Notes (Signed)
LVAD INR 

## 2021-02-17 IMAGING — CT CT HEAD WITHOUT CONTRAST
4 series · 16 of 47 positions shown, 18 images · non-contrast
Comparison: CT brain 08/22/2018

CLINICAL DATA: Altered mental status

EXAM:
CT HEAD WITHOUT CONTRAST
TECHNIQUE: Contiguous axial images were obtained from the base of the skull
through the vertex without intravenous contrast.

[Series 3: head without · axial · non-contrast · 0.45mm/px · z∈[-155,-35]mm · 7 of 34 slices shown, 9 images]
[im 5/34  brain]
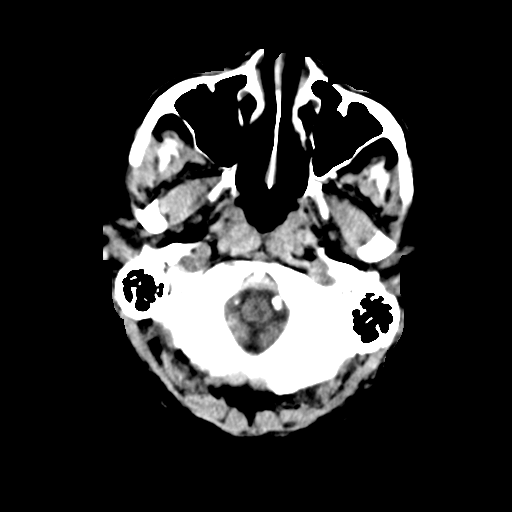
[im 5/34  bone]
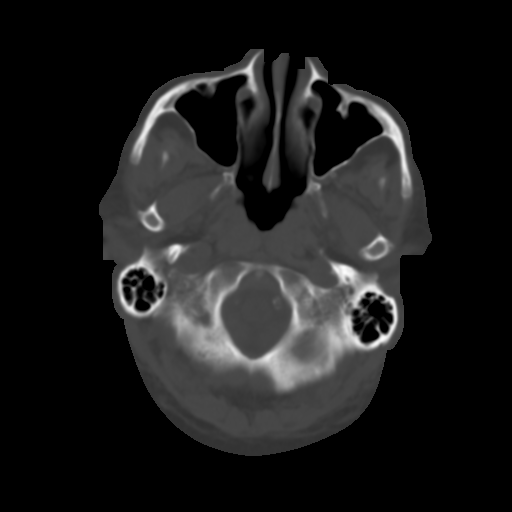
[im 9/34  brain]
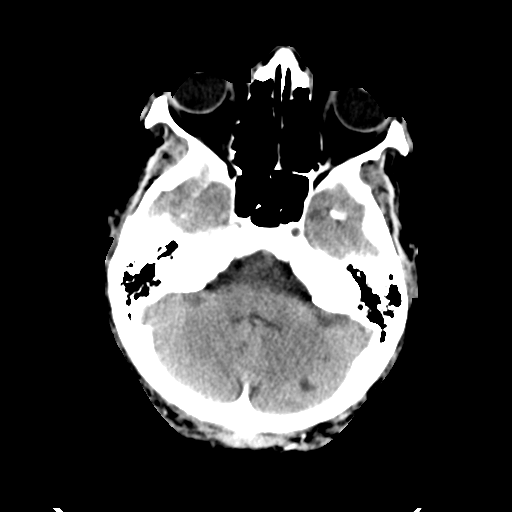
[im 13/34  brain]
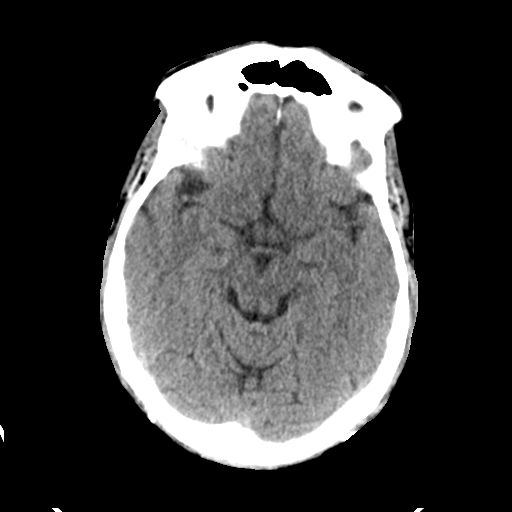
[im 17/34  brain]
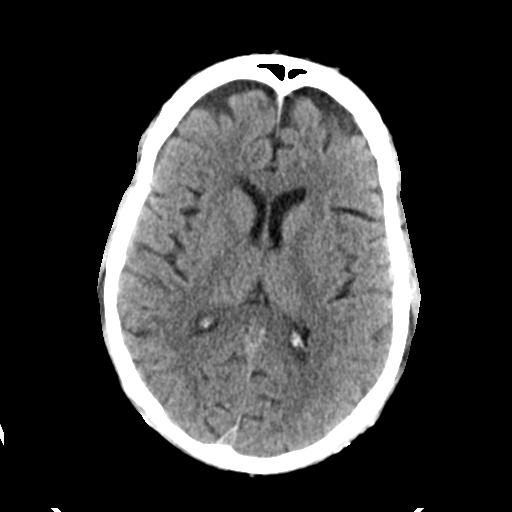
[im 21/34  brain]
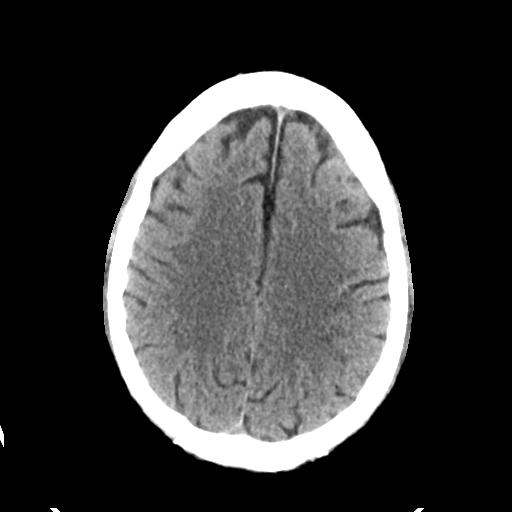
[im 21/34  bone]
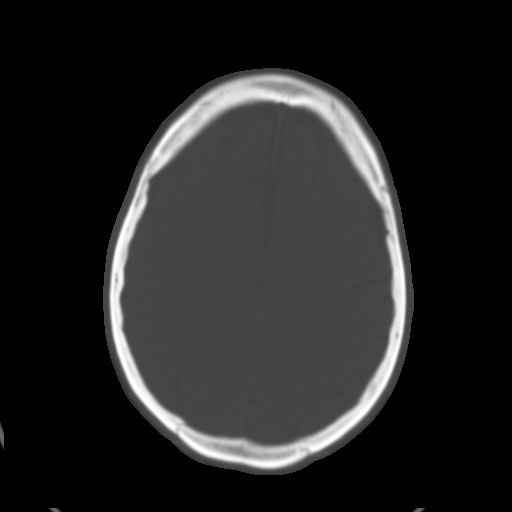
[im 25/34  brain]
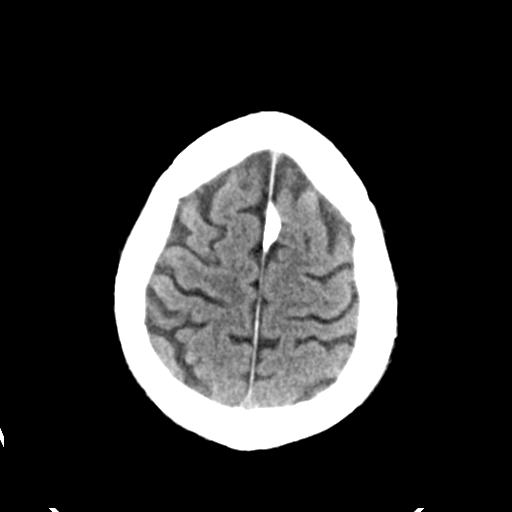
[im 29/34  brain]
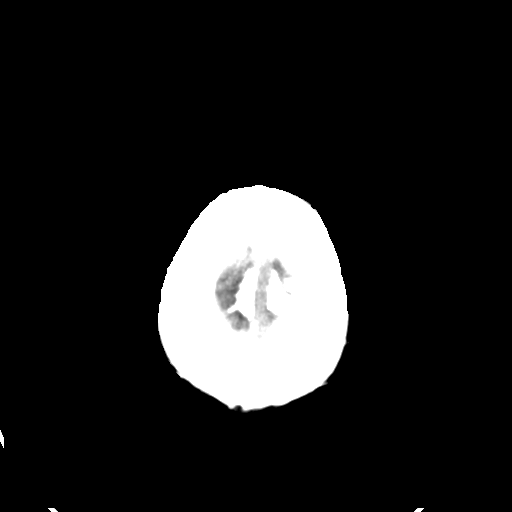

[Series 4: head bone · axial · 0.45mm/px · z∈[-159,-125]mm · 3 of 85 slices shown]
[im 9/85  bone]
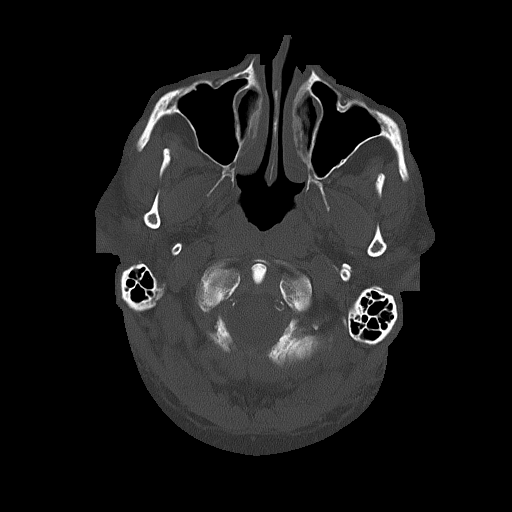
[im 17/85  bone]
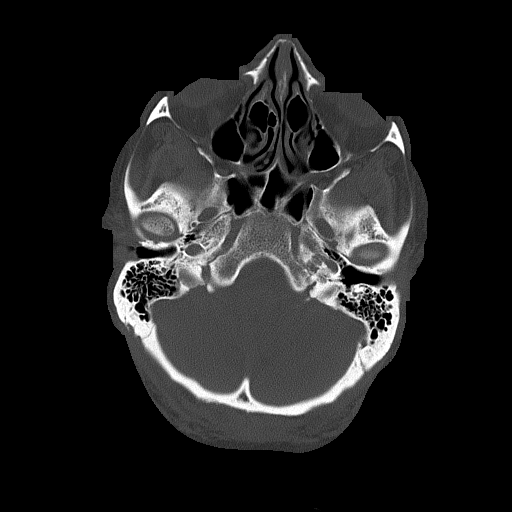
[im 26/85  bone]
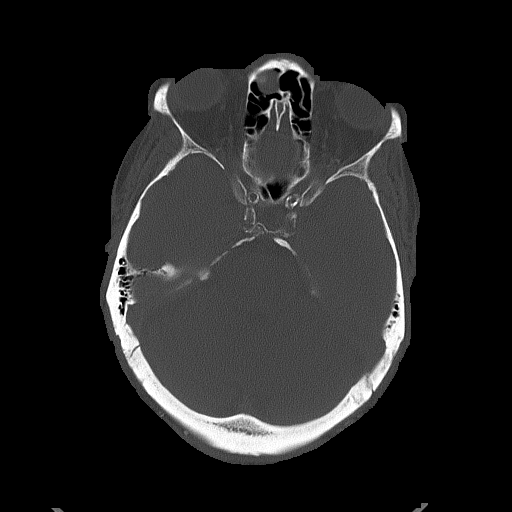

[Series 5: head without cor · coronal · non-contrast · 0.33mm/px · 3 of 69 slices shown]
[im 23/69  brain]
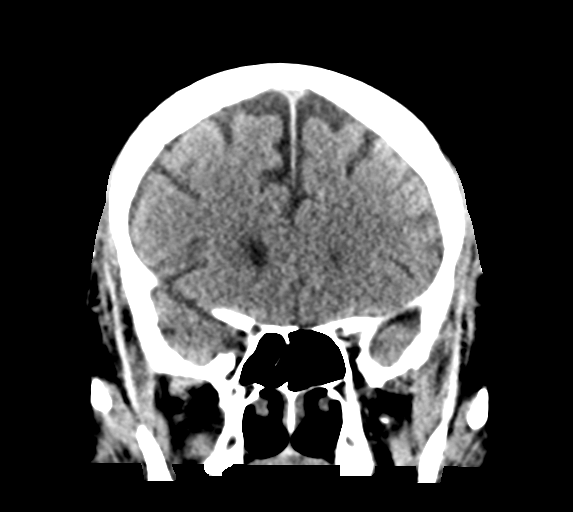
[im 31/69  brain]
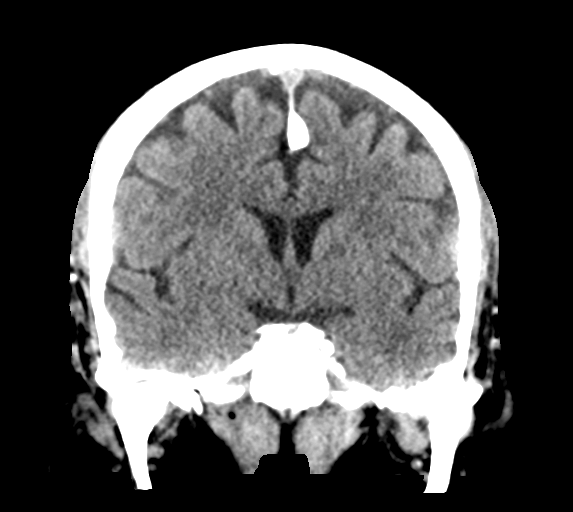
[im 38/69  brain]
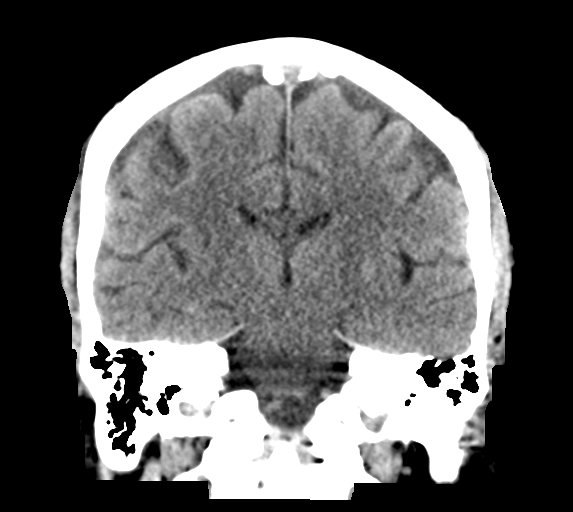

[Series 6: head without sag · sagittal · non-contrast · 0.31mm/px · 3 of 67 slices shown]
[im 23/67  brain]
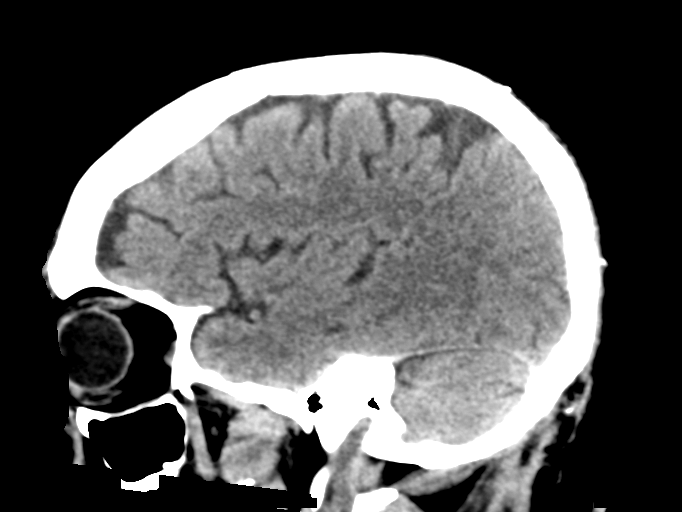
[im 34/67  brain]
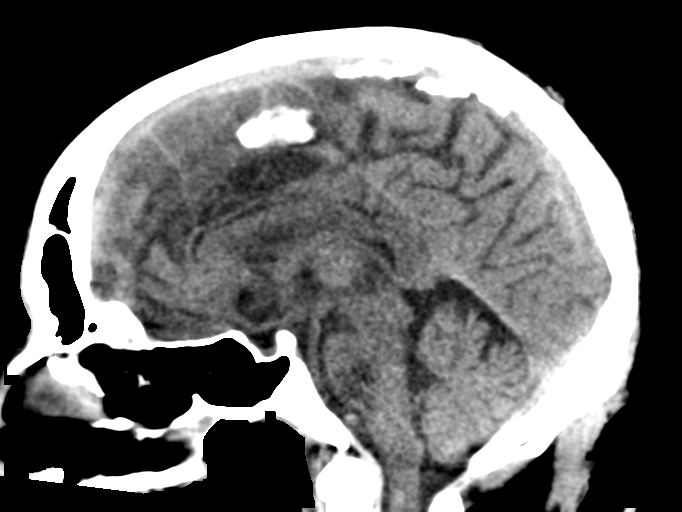
[im 45/67  brain]
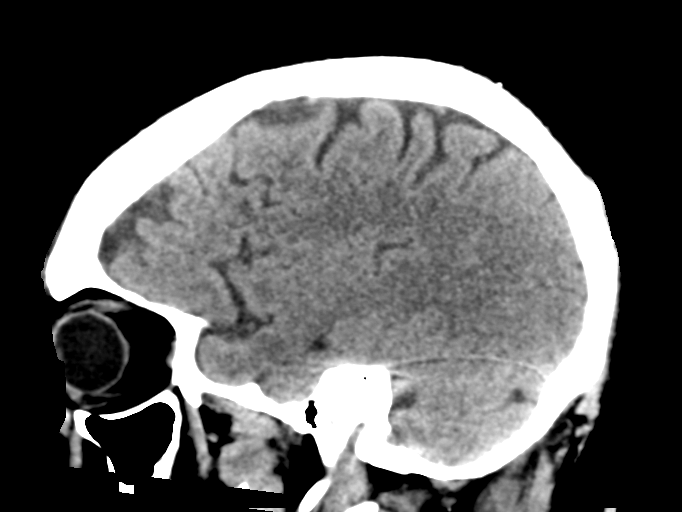

[16 of 47 positions shown; findings below may reference images not displayed]

FINDINGS: Brain: No acute territorial infarction, hemorrhage or intracranial
mass. Stable atrophy and mild small vessel ischemic changes of the
white matter. Chronic lacunar infarct left cerebellum. Stable
ventricle size.

Vascular: No hyperdense vessels. Carotid vascular and vertebral
artery calcification

Skull: Normal. Negative for fracture or focal lesion.

Sinuses/Orbits: Mucous retention cyst in the right frontal sinus.
Mild mucosal thickening in the maxillary and ethmoid sinuses

Other: None
IMPRESSION: 1. No CT evidence for acute intracranial abnormality.
2. Atrophy and minimal small vessel ischemic changes of the white
matter.

## 2021-02-18 LAB — CUP PACEART REMOTE DEVICE CHECK
Battery Remaining Longevity: 53 mo
Battery Remaining Percentage: 57 %
Battery Voltage: 2.95 V
Brady Statistic AP VP Percent: 1 %
Brady Statistic AP VS Percent: 1 %
Brady Statistic AS VP Percent: 1 %
Brady Statistic AS VS Percent: 99 %
Brady Statistic RA Percent Paced: 1 %
Brady Statistic RV Percent Paced: 1 %
Date Time Interrogation Session: 20220615141545
HighPow Impedance: 54 Ohm
HighPow Impedance: 54 Ohm
Implantable Lead Implant Date: 20100430
Implantable Lead Implant Date: 20100430
Implantable Lead Location: 753859
Implantable Lead Location: 753860
Implantable Lead Model: 7121
Implantable Pulse Generator Implant Date: 20180110
Lead Channel Impedance Value: 330 Ohm
Lead Channel Impedance Value: 640 Ohm
Lead Channel Pacing Threshold Amplitude: 0.75 V
Lead Channel Pacing Threshold Amplitude: 1 V
Lead Channel Pacing Threshold Pulse Width: 0.5 ms
Lead Channel Pacing Threshold Pulse Width: 0.5 ms
Lead Channel Sensing Intrinsic Amplitude: 1.1 mV
Lead Channel Sensing Intrinsic Amplitude: 9.4 mV
Lead Channel Setting Pacing Amplitude: 2.5 V
Lead Channel Setting Pacing Amplitude: 2.5 V
Lead Channel Setting Pacing Pulse Width: 0.5 ms
Lead Channel Setting Sensing Sensitivity: 0.5 mV
Pulse Gen Serial Number: 7383809

## 2021-02-23 ENCOUNTER — Ambulatory Visit (HOSPITAL_COMMUNITY): Payer: Self-pay

## 2021-02-23 LAB — POCT INR: INR: 2.3 (ref 2.0–3.0)

## 2021-02-23 NOTE — Progress Notes (Signed)
LVAD INR 

## 2021-02-25 ENCOUNTER — Other Ambulatory Visit: Payer: Self-pay | Admitting: Student in an Organized Health Care Education/Training Program

## 2021-02-25 DIAGNOSIS — Z794 Long term (current) use of insulin: Secondary | ICD-10-CM

## 2021-03-02 ENCOUNTER — Ambulatory Visit (HOSPITAL_COMMUNITY): Payer: Self-pay

## 2021-03-02 LAB — POCT INR: INR: 2.4 (ref 2.0–3.0)

## 2021-03-02 NOTE — Progress Notes (Signed)
LVAD INR 

## 2021-03-09 ENCOUNTER — Ambulatory Visit (HOSPITAL_COMMUNITY): Payer: Self-pay

## 2021-03-09 LAB — POCT INR: INR: 2.7 (ref 2.0–3.0)

## 2021-03-09 NOTE — Progress Notes (Signed)
LVAD INR 

## 2021-03-10 NOTE — Progress Notes (Signed)
Remote ICD transmission.   

## 2021-03-16 ENCOUNTER — Ambulatory Visit (HOSPITAL_COMMUNITY): Payer: Self-pay

## 2021-03-16 LAB — POCT INR: INR: 2.3 (ref 2.0–3.0)

## 2021-03-16 NOTE — Progress Notes (Signed)
LVAD INR 

## 2021-03-22 ENCOUNTER — Telehealth (HOSPITAL_COMMUNITY): Payer: Self-pay | Admitting: Unknown Physician Specialty

## 2021-03-22 NOTE — Telephone Encounter (Signed)
Called pt to check in after receiving labs that were drawn at the dialysis center on 03/17/21. Pt has had a gradual increase in his WBC over the past month. WBC ON 7/13 is 10.88. Previous month it was 9. Pt states that he had a cold/cough/congestion last week that he picked up from his grandson. Pt states that he feels better today and that his symptoms are much better from last week. We have f/u with the pt on the 26th. I instructed pt that if his symptoms return or worsen to please call the VAD office. We will have these labs scanned into epic.  [name] RN, BSN VAD Coordinator 24/7 Pager [phone]

## 2021-03-23 ENCOUNTER — Ambulatory Visit (HOSPITAL_COMMUNITY): Payer: Self-pay

## 2021-03-23 LAB — POCT INR: INR: 2.1 (ref 2.0–3.0)

## 2021-03-23 NOTE — Progress Notes (Signed)
LVAD INR 

## 2021-03-25 ENCOUNTER — Other Ambulatory Visit: Payer: Self-pay | Admitting: Student in an Organized Health Care Education/Training Program

## 2021-03-25 DIAGNOSIS — Z794 Long term (current) use of insulin: Secondary | ICD-10-CM

## 2021-03-25 DIAGNOSIS — E118 Type 2 diabetes mellitus with unspecified complications: Secondary | ICD-10-CM

## 2021-03-25 NOTE — Telephone Encounter (Signed)
Please inform the patient that he needs to make an appointment. He has not been seen since 09/2019.

## 2021-03-30 ENCOUNTER — Other Ambulatory Visit: Payer: Self-pay

## 2021-03-30 ENCOUNTER — Ambulatory Visit (HOSPITAL_BASED_OUTPATIENT_CLINIC_OR_DEPARTMENT_OTHER)
Admission: RE | Admit: 2021-03-30 | Discharge: 2021-03-30 | Disposition: A | Discharge status: Home / Self Care | Payer: Medicare Other | Source: Ambulatory Visit | Attending: Cardiology | Admitting: Cardiology

## 2021-03-30 ENCOUNTER — Ambulatory Visit (HOSPITAL_COMMUNITY): Payer: Self-pay

## 2021-03-30 ENCOUNTER — Ambulatory Visit (HOSPITAL_COMMUNITY)
Admission: RE | Admit: 2021-03-30 | Discharge: 2021-03-30 | Disposition: A | Discharge status: Home / Self Care | Payer: Medicare Other | Source: Ambulatory Visit | Attending: Student in an Organized Health Care Education/Training Program | Admitting: Student in an Organized Health Care Education/Training Program

## 2021-03-30 ENCOUNTER — Encounter (HOSPITAL_COMMUNITY): Payer: Self-pay

## 2021-03-30 VITALS — BP 110/68 | HR 86

## 2021-03-30 DIAGNOSIS — I252 Old myocardial infarction: Secondary | ICD-10-CM | POA: Insufficient documentation

## 2021-03-30 DIAGNOSIS — I5022 Chronic systolic (congestive) heart failure: Secondary | ICD-10-CM

## 2021-03-30 DIAGNOSIS — Z95811 Presence of heart assist device: Secondary | ICD-10-CM | POA: Diagnosis not present

## 2021-03-30 DIAGNOSIS — I2510 Atherosclerotic heart disease of native coronary artery without angina pectoris: Secondary | ICD-10-CM | POA: Insufficient documentation

## 2021-03-30 DIAGNOSIS — E1143 Type 2 diabetes mellitus with diabetic autonomic (poly)neuropathy: Secondary | ICD-10-CM | POA: Insufficient documentation

## 2021-03-30 DIAGNOSIS — Z89612 Acquired absence of left leg above knee: Secondary | ICD-10-CM | POA: Diagnosis not present

## 2021-03-30 DIAGNOSIS — Z89611 Acquired absence of right leg above knee: Secondary | ICD-10-CM | POA: Diagnosis not present

## 2021-03-30 DIAGNOSIS — K219 Gastro-esophageal reflux disease without esophagitis: Secondary | ICD-10-CM | POA: Insufficient documentation

## 2021-03-30 DIAGNOSIS — Z9049 Acquired absence of other specified parts of digestive tract: Secondary | ICD-10-CM | POA: Insufficient documentation

## 2021-03-30 DIAGNOSIS — I351 Nonrheumatic aortic (valve) insufficiency: Secondary | ICD-10-CM | POA: Insufficient documentation

## 2021-03-30 DIAGNOSIS — I132 Hypertensive heart and chronic kidney disease with heart failure and with stage 5 chronic kidney disease, or end stage renal disease: Secondary | ICD-10-CM | POA: Insufficient documentation

## 2021-03-30 DIAGNOSIS — Z992 Dependence on renal dialysis: Secondary | ICD-10-CM | POA: Insufficient documentation

## 2021-03-30 DIAGNOSIS — D6851 Activated protein C resistance: Secondary | ICD-10-CM | POA: Insufficient documentation

## 2021-03-30 DIAGNOSIS — Z7901 Long term (current) use of anticoagulants: Secondary | ICD-10-CM | POA: Insufficient documentation

## 2021-03-30 DIAGNOSIS — I4892 Unspecified atrial flutter: Secondary | ICD-10-CM | POA: Diagnosis not present

## 2021-03-30 DIAGNOSIS — Z452 Encounter for adjustment and management of vascular access device: Secondary | ICD-10-CM | POA: Diagnosis not present

## 2021-03-30 DIAGNOSIS — Z955 Presence of coronary angioplasty implant and graft: Secondary | ICD-10-CM | POA: Diagnosis not present

## 2021-03-30 DIAGNOSIS — Z86718 Personal history of other venous thrombosis and embolism: Secondary | ICD-10-CM | POA: Diagnosis not present

## 2021-03-30 DIAGNOSIS — E785 Hyperlipidemia, unspecified: Secondary | ICD-10-CM | POA: Insufficient documentation

## 2021-03-30 DIAGNOSIS — Z794 Long term (current) use of insulin: Secondary | ICD-10-CM | POA: Insufficient documentation

## 2021-03-30 DIAGNOSIS — K3184 Gastroparesis: Secondary | ICD-10-CM | POA: Diagnosis not present

## 2021-03-30 DIAGNOSIS — N186 End stage renal disease: Secondary | ICD-10-CM

## 2021-03-30 DIAGNOSIS — E1151 Type 2 diabetes mellitus with diabetic peripheral angiopathy without gangrene: Secondary | ICD-10-CM | POA: Insufficient documentation

## 2021-03-30 DIAGNOSIS — E1122 Type 2 diabetes mellitus with diabetic chronic kidney disease: Secondary | ICD-10-CM | POA: Insufficient documentation

## 2021-03-30 DIAGNOSIS — G8929 Other chronic pain: Secondary | ICD-10-CM | POA: Insufficient documentation

## 2021-03-30 DIAGNOSIS — Z7982 Long term (current) use of aspirin: Secondary | ICD-10-CM | POA: Insufficient documentation

## 2021-03-30 DIAGNOSIS — I255 Ischemic cardiomyopathy: Secondary | ICD-10-CM | POA: Insufficient documentation

## 2021-03-30 DIAGNOSIS — Z79899 Other long term (current) drug therapy: Secondary | ICD-10-CM | POA: Insufficient documentation

## 2021-03-30 LAB — ECHOCARDIOGRAM COMPLETE
Area-P 1/2: 4.65 cm2
S' Lateral: 4.2 cm

## 2021-03-30 LAB — POCT INR: INR: 2.1 (ref 2.0–3.0)

## 2021-03-30 NOTE — Progress Notes (Signed)
  Echocardiogram 2D Echocardiogram has been performed.  Randa Lynn Alysiah Suppa 03/30/2021, 9:44 AM

## 2021-03-30 NOTE — Progress Notes (Addendum)
Patient presents for 2 month follow up visit follow up with 3 year Intermacs and annual maintenance in [facility] today with his son. Reports no problems with VAD equipment or concerns with drive line.   Pt is very pleasant today and states that he feels great. States that dialysis is going well, other than cramping of right lower quadrant near drive line towards end of treatment. He has had this in the past. He reports he recently had HD MD decrease his dry weight. He tolerated for a few weeks but now occassionally has lower BP towards end of treatment with abdominal cramping. Per Dr Aundra Dubin ok to increase dry weight to see if this helps with BP and cramping.   Denies lightheadedness, dizziness, chest pain, or signs of bleeding. Denies falls. Transferred himself from wheelchair to stretcher today. Reports shortness of breath when he is outside in the heat. This resolves when back indoors. Pt states that his nausea has been much better lately and that he has not taken any phenergan or Zofran since 2021. Reports good PO intake. PRN Oxy provided by pain clinic for back and groin pain- pain well managed.   Echo completed today and reviewed by Dr Aundra Dubin- will increase speed to 5600 today. Low speed limit increased to 5300. Instructed pt to call if develops any heart failure symptoms. He verbalized understanding.    Annual maintenance completed on pt's equipment today. Batteries patient brought were from 2018. Pt was given 2 sets of new batteries in June 2021. Requested pt retire 2018 batteries and use new batteries. He verbalized understanding of the same. Also discussed need for battery calibration every 70 uses. He and son Brooke Bonito verbalized understanding of the same. Pt's equipment very dirty today- discussed need to keep equipment clean by performing weekly maintenance/cleaning. Both verbalized understanding.   Pt's drive line outer casing has a large tear near controller with internal wiring exposed. Replaced  modular cable with Lot#: [id number] Manufacture: 11/26/20 Exp: 11/28/23 using patient's back up controller. Primary controller now: [id number]. Back up controller now: [id number].    Labs received from dialysis were drawn on 03/18/21. Reviewed with Dr Aundra Dubin.   Vital Signs:  Doppler Pressure 108 Automatc BP: 110/68 (82) HR: 86 SR SPO2: UTO % on RA   Weight: UTO lb w/o eqt Last weight: 200 lb   VAD Indication: Destination therapy- Pt is no longer candidate for heart/kidney transplant due to PAD per [facility].    VAD interrogation & Equipment Management: Speed: 8127       increased to 5600 today Flow: 4.0 Power: 4.6w    PI: 4.7   Alarms: none Events: 30 PI events so far today; 100+ on dialysis days  Fixed speed 5500-  increased to 5600 Low speed limit: 5200- increased to 5300   Primary Controller: Replace back up battery in 17 months Back up controller:   Replace back up battery in 31 months   Annual Equipment Maintenance on UBC/PM was performed on 03/30/21.    I reviewed the LVAD parameters from today and compared the results to the patient's prior recorded data. LVAD interrogation was NEGATIVE for significant power changes, NEGATIVE for clinical alarms and STABLE for PI events/speed drops. No programming changes were made and pump is functioning within specified parameters. Pt is performing daily controller and system monitor self tests along with completing weekly and monthly maintenance for LVAD equipment.   LVAD equipment check completed and is in good working order. Back-up equipment present.   Exit Site  Care: Drive line is being maintained weekly by daughter. CDI today. No anchor in place today. Pt states he is not using anchors because they irritate his skin. Discussed need to immobilize drive line to prevent trauma. Pt verbalized understanding of same, but states he will not use. Provided patient with 8 weekly dressing kits for home use.  Significant Events on VAD Support:   -10/2016>> poss drive line infection, CT ABD neg, ID consult-doxy -11/09/16>> admit for poss drive line infection, IV abx -03/24/17>> doxy for poss drive line infection -05/04/17>> drive line debridement with wound-vac -06/2017>> drive line +proteus, IV abx, Bactrim x14 days  Device: St jude Dual Therapies: on 200 bpm Last check: 01/01/18 - 2 hrs Afl/Afib; EMI 04/02/18  BP & Labs: Doppler BP 108 - is reflecting modified systolic.   Hgb 10.0 difficult venous access. Result  from dialysis labs 03/18/21 - No S/S of bleeding. Specifically denies melena/BRBPR or nosebleeds.   LDH 204, difficult venous access. Results from dialysis labs 03/18/21 - Denies tea-colored urine. No power elevations noted on interrogation.    Batteries Manufacture Date: Number of uses: Re-calibration  02/15/2017 (expired) requested pt retire all 2018 batteries and use new batteries provided 02/2020 0-222 Performed by patient- educated on importance of performing calibration   Annual maintenance completed per [place] on patient's home mobile power unit and Electrical engineer.    Backup system controller 11 volt battery charged during visit.   3 year Intermacs follow up completed including:  Quality of Life, KCCQ-12, and Neurocognitive trail making.   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   [facility] Cardiomyopathy Questionnaire  KCCQ-12 03/30/2021 09/24/2020 02/25/2020  1 a. Ability to shower/bathe Slightly limited Not at all limited Other, Did not do  1 b. Ability to walk 1 block Other, Did not do Other, Did not do Other, Did not do  1 c. Ability to hurry/jog Other, Did not do Other, Did not do Other, Did not do  2. Edema feet/ankles/legs Never over the past 2 weeks Never over the past 2 weeks -  3. Limited by fatigue Less than once a week Never over the past 2 weeks Never over the past 2 weeks  4. Limited by dyspnea Less than once a week Never over the past 2 weeks Several times a day  5. Sitting up  / on 3+ pillows Never over the past 2 weeks Never over the past 2 weeks Never over the past 2 weeks  6. Limited enjoyment of life Not limited at all Not limited at all Not limited at all  7. Rest of life w/ symptoms Completely satisfied Completely satisfied Mostly satisfied  8 a. Participation in hobbies N/A, did not do for other reasons - Slightly limited  8 b. Participation in chores N/A, did not do for other reasons N/A, did not do for other reasons Slightly limited  8 c. Visiting family/friends N/A, did not do for other reasons N/A, did not do for other reasons Slightly limited     Patient Instructions:  No medication changes today Coumadin dosing per Jinny Blossom PharmD We increased your speed to 5600 today Return to [place] clinic in 2 months for follow up with Dr Dwyane Dee RN [place] Coordinator  Office: [phone]  24/7 Pager: [phone]   Cardiology: Dr. Aundra Dubin  HPI: 55 y.o. with history of CAD s/p anterior MI in 2010 and NSTEMI in 3/18 with DES to pRCA and mLAD and ischemic cardiomyopathy now s/p Heartmate 3 LVAD  and ESRD presents for followup of CHF/LVAD.  Patient developed low output heart failure.  He was not deemed to be a good transplant candidate.  He was admitted in 10/18 and Heartmate 3 LVAD was placed.  He had baseline CKD stage 3 likely from diabetic nephropathy and CHF.  He developed peri-op AKI and ended up on dialysis.  No renal recovery to date.  St Jude ICD has been turned back on and ramp echo was done prior to discharge in 10/18, speed increased to 5600 rpm.   He was admitted in 11/18 with nausea/vomiting and abdominal pain.  This appears to have been due to diabetic gastroparesis (extensive GI evaluation). He had atrial flutter this admission that was converted to NSR with amiodarone.  He improved considerably with scheduled Reglan and erythromycin. He was re-admitted later in 11/18 with the same symptoms, suspect diabetic gastroparesis and was again treated with  erythromycin.  He was not able to start domperidone due to prolonged QT interval. He had nausea again about a week ago but was able to manage at home with Phenergan suppositories.   Admitted 1/8 -> 09/16/17 for uncontrolled vomiting in setting of diabetic gastroparesis. Improved with phenergan suppositories and given erythromycin TID for 7 days post-discharge.    He was admitted in 2/19 for AV graft placement.  While in the hospital, we turned down his speed.  Since then, he has had considerably fewer PI events on HD days.    He was admitted in 4/19 with nausea/vomiting again, probably gastroparesis flare.   He was admitted in 5/19 with nausea/vomiting, likely gastroparesis flare, treated with erythromycin course which he is finishing.   He has been evaluated at [facility] by Dr. Derrill Kay (gastroparesis specialist).   Gastric emptying study, surprisingly, was near normal. However, electrogastrogram showed poor gastric accomodation/capacity, "bradygastria."  Frequent small meals were recommended. Also, early am nausea was thought to be possible GERD.    Admitted 6/26 - 03/19/18 for emergent VAD exchange 03/01/18 in the setting of HM3 outflow graft kink. He required CVVHD post op, but was transtioned back to [facility] without problems. Course complicated by leukocytosis and fever. CT abd/chest negative for infection and blood cultures were negative. Post op pain well controlled with rare tramadol. Able to tolerate diet. Evaluated by PT/OT and HH PT recommended for discharge. Ramp echo completed prior to discharge with speed optimization. Resumed 81 mg ASA + coumadin with INR goal 2-3.   He was admitted 03/23/18 with nausea/gastroparesis symptoms and marked HTN at outpatient HD.  Erythromycin was restarted and gastroparesis symptoms were controlled with fall in BP and discharge on 03/25/18.   He was admitted in 9/19 for LUE AV fistula placement.  He was readmitted in 10/19 with his typical gastroparesis symptoms,  unable to take po. The fistula later failed and was replaced with a graft.  However, the graft was nonfunctional and developed a hematoma at the surgical site.    He was admitted in 12/19 with nausea/vomiting from HD. Also with LDH increased to 427 in setting of extensive hematoma around his left arm AV graft.  He has had tingling in his left hand and left grip is weak.    In 7/20, he was admitted with ischemic 4th toe on right foot.  Peripheral angiogram 03/18/19 showed 80% right SFA stenosis and occluded right PT.  He had stenting of SFA and PTCA of PT with restoration of flow.  However, he still needed amputation right 4th toe on 7/16 with intractable  pain.  He was readmitted with pain in his right foot, nausea, and vomiting. There was concern for ongoing right foot ischemia with all toes and a portion of the foot now dusky.  He was treated with IV vancomycin for possible osteomyelitis/infection, but thought that more likely ischemia could explain all the findings.  He was discharged home to followup with vascular surgery.   He was re-admitted in 8/20 with ongoing right foot pain and ended up having transmetatarsal amputation of the right foot.  Left leg angiogram was done with DES to left SFA, only 1 vessel runoff via severely diseased peroneal artery, unable to intervene.   He was readmitted in 9/20 with ongoing right foot/lower leg pain and had right AKA.  He was discharged to [facility].    He was then admitted with gangrene in the remnant of his left foot in 11/20.  He had left AKA.    He returns today for followup of LVAD.  He has been stable recently.  Able to make transfers back and forth to wheelchair without dyspnea.  No nausea.  No lightheadedness.  No orthopnea/PND.  MAP controlled.  No problems at HD.  He has a tear in the outer casing of his driveline.   Reports taking Coumadin as prescribed and adherence to anticoagulation based dietary restrictions. Denies BRBPR or melena. No dark  urine or hematuria.   Labs (11/18): hgb 10.4 => 8.7, LDH 213 Labs (08/04/17): hgb 9 Labs (1/19): HgbA1c 6.1 Labs (2/19): hgb 13.2 Labs (3/19): hgb 11.1 Labs (7/19): hgb 9.6 Labs (10/19: hgb 11.8 Labs (12/19): LDH 427 => 237, hgb 7.7 Labs (11/20): hgb 8.1 Labs (1/21): HIV negative, RPR negative Labs (3/21): hgb 8.8 Labs (6/21): hgb 12.4 Labs (8/21): hgb 10.8, LDH 201 Labs (10/21): hgb 12.4, LDH 220 Labs (12/21): hgb 12, LDH 228 Labs (3/22): hgb 11.7 Labs (5/22): hgb 10.7, LDH 180  PMH: 1. Chronic systolic CHF: Ischemic cardiomyopathy.  St Jude ICD.  - Echo (11/17) with EF 25-30%, moderate diastolic dysfunction, mild MR, mildly dilated RV with moderately decreased systolic function, peak RV-RA gradient 48 mmHg.  - Echo (3/18) with EF 25-30%, wall motion abnormalities, normal RV size and systolic function.  - Admission 3/18 with cardiogenic shock and AKI, required milrinone gtt.  - Echo (8/18) with EF 20-25%, mild LVH, moderate LV dilation, mild MR.  - CPX (8/18) with peak VO2 12.4, VE/VCO2 slope 35, RER 1.05 => severe HR limitation.  - Heartmate 3 LVAD 10/18.  - Pump thrombosis in setting of HM3 outflow graft kink.  Pump exchange 6/19 for HM3.  2. CAD: Anterior MI 2005, had bifurcation stenting LAD/diagonal.  Repeat cath 2010 with patent stents.  - NSTEMI 3/18: LHC with 99% ulcerated lesion proximal RCA, 95% mid LAD => PCI with DES to proximal RCA and mid LAD.  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  - Lower extremity venous dopplers (8/18): chronic DVT - V/Q scan (8/18): Normal, no PE.  4. Crohns disease: History of colectomy.  5. GERD 6. Diabetic gastroparesis: Admission 11/18 with abdominal pain, nausea/vomiting.  - [facility] workup 2019: Gastric emptying study near normal.  Negative breath test (no evidence for small bowel bacterial overgrowth).  Electrogastrogram showed poor gastric accomodation/capacity, "bradygastria."  7. HTN 8. Hyperlipidemia 9. Type II diabetes  10. ESRD: Post-op  LVAD surgery.  He has had failed AV fistula and AV graft, now dialyzing via catheter . 11. Carotid dopplers (8/18): mild bilateral stenosis.  12. PFTs (8/18): Mild restriction (no IDL  on CT chest).  13. Lung nodules: CT chest 8/18 with small bilateral nodules.  14. PAD: ABIs with moderate bilateral disease in 8/18.  - Peripheral angiogram 03/18/19 showed 80% right SFA stenosis and occluded right PT.  He had stenting of SFA and PTCA of PT with restoration of flow.  However, he still needed amputation right 4th toe on 03/21/19 with intractable pain.  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  - Peripheral angiogram left leg: DES to left SFA, only 1 vessel runoff via severely diseased peroneal artery, unable to intervene. 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  - Left AKA 11/20.  15. Atrial flutter: In 11/18 associated with nausea/vomiting from gastroparesis.   Current Outpatient Medications  Medication Sig Dispense Refill   aspirin EC 81 MG tablet Take 1 tablet (81 mg total) by mouth daily. 90 tablet 3   atorvastatin (LIPITOR) 40 MG tablet Take 1 tablet (40 mg total) by mouth daily at 6 PM. 90 tablet 3   calcitRIOL (ROCALTROL) 0.5 MCG capsule Take 1 capsule (0.5 mcg total) by mouth every Monday, Wednesday, and Friday. 15 capsule 5   citalopram (CELEXA) 20 MG tablet Take 1 tablet (20 mg total) by mouth daily. 30 tablet 6   hydrALAZINE (APRESOLINE) 25 MG tablet Take 1 tablet three times a day on NON-dialysis days only 270 tablet 3   LANTUS 100 UNIT/ML injection INJECT 35 UNITS TOTAL INTO THE SKIN DAILY. 10 mL 0   LORazepam (ATIVAN) 0.5 MG tablet Take 1 tablet (0.5 mg total) by mouth every 12 (twelve) hours as needed (nausea). 60 tablet 0   metoCLOPramide (REGLAN) 10 MG tablet Take 1 tablet (10 mg total) by mouth 3 (three) times daily. (Patient taking differently: Take 10 mg by mouth 3 (three) times daily before meals.) 90 tablet 6   midodrine (PROAMATINE) 5 MG tablet Take 3 tablets (15 mg total) by mouth every Monday,  Wednesday, and Friday. Take 1 tablet before dialysis treatment 60 tablet 6   multivitamin (RENA-VIT) TABS tablet Take 1 tablet by mouth daily.     oxyCODONE (ROXICODONE) 5 MG immediate release tablet Take 1 tablet (5 mg total) by mouth 2 (two) times daily as needed for severe pain. Take 1-2 tablets every 4-6 hours for severe pain. 30 tablet 0   pantoprazole (PROTONIX) 40 MG tablet Take 1 tablet (40 mg total) by mouth 2 (two) times daily before lunch and supper. 60 tablet 11   sevelamer carbonate (RENVELA) 0.8 g PACK packet Take 0.8 g by mouth 3 (three) times daily with meals. 270 each 1   warfarin (COUMADIN) 5 MG tablet Take 1 tablet (5 mg total) by mouth daily. 30 tablet 11   nitroGLYCERIN (NITROSTAT) 0.4 MG SL tablet Place 1 tablet (0.4 mg total) under the tongue every 5 (five) minutes as needed for chest pain. (Patient not taking: Reported on 01/26/2021) 90 tablet 3   ondansetron (ZOFRAN-ODT) 4 MG disintegrating tablet Take by mouth as needed. (Patient not taking: No sig reported)     promethazine (PHENERGAN) 12.5 MG tablet TAKE 1 TABLET (12.5 MG TOTAL) BY MOUTH EVERY 6 (SIX) HOURS AS NEEDED FOR NAUSEA OR VOMITING. (Patient not taking: No sig reported) 30 tablet 3   No current facility-administered medications for this encounter.    Metformin and related   Review of systems complete and found to be negative unless listed in HPI.    BP 110/68 Comment: map 82  Pulse 86 Comment: SR  MAP 80   VAD interrogation & Equipment Management: See  LVAD nurse's note above.   General: Well appearing this am. NAD.  HEENT: Normal. Neck: Supple, JVP 7-8 cm. Carotids OK.  Cardiac:  Mechanical heart sounds with LVAD hum present.  Lungs:  CTAB, normal effort.  Abdomen:  NT, ND, no HSM. No bruits or masses. +BS  LVAD exit site: Well-healed and incorporated. Dressing dry and intact. No erythema or drainage. Stabilization device present and accurately applied. Driveline dressing changed daily per sterile  technique. Extremities:  s/p bilateral AKA Neuro:  Alert & oriented x 3. Cranial nerves grossly intact. Moves all 4 extremities w/o difficulty. Affect pleasant    ASSESSMENT AND PLAN: 1. PAD/ischemic feet:  He is now s/p bilateral AKAs.  Coping well, able to transfer from bed to wheelchair on his own.  2. Chronic systolic CHF: Ischemic cardiomyopathy.  St Jude ICD.  NSTEMI in March 2018 with DES to LAD and RCA, complicated by cardiogenic shock, low output requiring milrinone.  Echo 8/18 with EF 20-25%, moderate MR. CPX (8/18) with severe functional impairment due to HF.  He is s/p HM3 LVAD placement. He had pump thrombosis 6/19 due to [facility] outflow graft kinking.  He had pump exchange for another HM3.  LVAD parameters stable, no recent low flows with HD. He takes midodrine pre-HD, but MAP now better-controlled on non-HD days with hydralazine. Echo was done today and showed mild septal bowing to the right and the aortic valve opened every beat.   - Increased speed to 5600 rpm.  - Volume managed at dialysis, MWF.   - Continue ASA 81 and warfarin with INR goal 2-2.5.    - Continue hydralazine 25 mg tid on non-HD days.  - He is not candidate for transplant now given extensive PAD with amputations.   3. ESRD: HD on M-W-F with midodrine pre-HD.  - Need to allow UF as long as MAP > 70 (would not stop for MAP 70-80).   - Continue pre-HD midodrine 15 mg.  4.  CAD: NSTEMI in 3/18.  LHC with 99% ulcerated lesion proximal RCA with left to right collaterals, 95% mid LAD stenosis after mid LAD stent.  s/p PCI to RCA and LAD on 11/25/16. No chest pain.  - Continue statin and ASA.  5. h/o DVTs: Factor V Leiden heterozygote. - Anticoagulated. 6. Diabetic gastroparesis: He has seen gastroparesis specialist at [facility]. Surprisingly, gastric emptying study was normal. However, electrogastrogram showed poor gastric accomodation/capacity. Therefore, small frequent meals recommended.  He has been doing much better with  this, minimal symptoms.  - Continue reglan 10 mg tid/ac, now off Marinol.  - Continue Protonix bid.  - Limit narcotic use as much as possible.  7. HTN: MAP better, takes hydralazine 25 tid on non-HD days.  8. Chronic pain: Now seen in a pain clinic.  9. Atrial flutter: Paroxysmal.  Not seen recently. Now off amiodarone.   Followup in 2 months.   Kazden Largo 03/30/2021

## 2021-03-30 NOTE — Patient Instructions (Signed)
No medication changes today Coumadin dosing per Jinny Blossom PharmD We increased your speed to 5600 today Return to [facility] clinic in 2 months for follow up with Dr Aundra Dubin

## 2021-03-30 NOTE — Progress Notes (Signed)
LVAD INR 

## 2021-04-01 ENCOUNTER — Other Ambulatory Visit (HOSPITAL_COMMUNITY): Payer: Self-pay | Admitting: Cardiology

## 2021-04-06 ENCOUNTER — Ambulatory Visit (HOSPITAL_COMMUNITY): Payer: Self-pay | Admitting: Pharmacist

## 2021-04-06 LAB — POCT INR: INR: 2.1 (ref 2.0–3.0)

## 2021-04-06 NOTE — Progress Notes (Signed)
LVAD INR 

## 2021-04-13 ENCOUNTER — Ambulatory Visit (HOSPITAL_COMMUNITY): Payer: Self-pay | Admitting: Pharmacist

## 2021-04-13 LAB — POCT INR: INR: 2.3 (ref 2.0–3.0)

## 2021-04-13 NOTE — Progress Notes (Signed)
LVAD INR 

## 2021-04-20 ENCOUNTER — Ambulatory Visit (HOSPITAL_COMMUNITY): Payer: Self-pay | Admitting: Pharmacist

## 2021-04-20 LAB — POCT INR: INR: 2.2 (ref 2.0–3.0)

## 2021-04-20 NOTE — Progress Notes (Signed)
LVAD INR 

## 2021-04-26 ENCOUNTER — Other Ambulatory Visit (HOSPITAL_COMMUNITY): Payer: Self-pay | Admitting: Cardiology

## 2021-04-27 ENCOUNTER — Ambulatory Visit (HOSPITAL_COMMUNITY): Payer: Self-pay | Admitting: Pharmacist

## 2021-04-27 LAB — POCT INR: INR: 2.1 (ref 2.0–3.0)

## 2021-04-27 NOTE — Progress Notes (Signed)
LVAD INR 

## 2021-04-30 ENCOUNTER — Other Ambulatory Visit: Payer: Self-pay | Admitting: Family Medicine

## 2021-04-30 DIAGNOSIS — Z794 Long term (current) use of insulin: Secondary | ICD-10-CM

## 2021-05-03 NOTE — Telephone Encounter (Signed)
This patient has not been seen in our clinic since 09/2019. He has not had an A1c since 2020 and at that time was 6.0. He needs to make an appointment.

## 2021-05-04 ENCOUNTER — Ambulatory Visit (HOSPITAL_COMMUNITY): Payer: Self-pay | Admitting: Pharmacist

## 2021-05-04 LAB — POCT INR: INR: 1.9 — AB (ref 2.0–3.0)

## 2021-05-04 NOTE — Progress Notes (Signed)
LVAD INR 

## 2021-05-12 ENCOUNTER — Ambulatory Visit (HOSPITAL_COMMUNITY): Payer: Self-pay | Admitting: Pharmacist

## 2021-05-12 LAB — POCT INR: INR: 2.2 (ref 2.0–3.0)

## 2021-05-12 NOTE — Progress Notes (Signed)
LVAD INR 

## 2021-05-18 ENCOUNTER — Ambulatory Visit (INDEPENDENT_AMBULATORY_CARE_PROVIDER_SITE_OTHER): Payer: Medicare Other

## 2021-05-18 ENCOUNTER — Ambulatory Visit (HOSPITAL_COMMUNITY): Payer: Self-pay | Admitting: Pharmacist

## 2021-05-18 DIAGNOSIS — I255 Ischemic cardiomyopathy: Secondary | ICD-10-CM | POA: Diagnosis not present

## 2021-05-18 LAB — POCT INR: INR: 2.4 (ref 2.0–3.0)

## 2021-05-18 NOTE — Progress Notes (Signed)
LVAD INR 

## 2021-05-19 LAB — CUP PACEART REMOTE DEVICE CHECK
Battery Remaining Longevity: 50 mo
Battery Remaining Percentage: 55 %
Battery Voltage: 2.95 V
Brady Statistic AP VP Percent: 1 %
Brady Statistic AP VS Percent: 1 %
Brady Statistic AS VP Percent: 1 %
Brady Statistic AS VS Percent: 99 %
Brady Statistic RA Percent Paced: 1 %
Brady Statistic RV Percent Paced: 1 %
Date Time Interrogation Session: 20220914173114
HighPow Impedance: 52 Ohm
HighPow Impedance: 52 Ohm
Implantable Lead Implant Date: 20100430
Implantable Lead Implant Date: 20100430
Implantable Lead Location: 753859
Implantable Lead Location: 753860
Implantable Lead Model: 7121
Implantable Pulse Generator Implant Date: 20180110
Lead Channel Impedance Value: 340 Ohm
Lead Channel Impedance Value: 640 Ohm
Lead Channel Pacing Threshold Amplitude: 0.75 V
Lead Channel Pacing Threshold Amplitude: 1 V
Lead Channel Pacing Threshold Pulse Width: 0.5 ms
Lead Channel Pacing Threshold Pulse Width: 0.5 ms
Lead Channel Sensing Intrinsic Amplitude: 1 mV
Lead Channel Sensing Intrinsic Amplitude: 12 mV
Lead Channel Setting Pacing Amplitude: 2.5 V
Lead Channel Setting Pacing Amplitude: 2.5 V
Lead Channel Setting Pacing Pulse Width: 0.5 ms
Lead Channel Setting Sensing Sensitivity: 0.5 mV
Pulse Gen Serial Number: 7383809

## 2021-05-25 ENCOUNTER — Ambulatory Visit (HOSPITAL_COMMUNITY): Payer: Self-pay | Admitting: Pharmacist

## 2021-05-25 LAB — POCT INR: INR: 2.1 (ref 2.0–3.0)

## 2021-05-25 NOTE — Progress Notes (Signed)
LVAD INR 

## 2021-05-26 NOTE — Progress Notes (Signed)
Remote ICD transmission.   

## 2021-05-27 ENCOUNTER — Other Ambulatory Visit: Payer: Self-pay | Admitting: Family Medicine

## 2021-05-27 DIAGNOSIS — Z794 Long term (current) use of insulin: Secondary | ICD-10-CM

## 2021-05-27 DIAGNOSIS — E118 Type 2 diabetes mellitus with unspecified complications: Secondary | ICD-10-CM

## 2021-06-01 ENCOUNTER — Ambulatory Visit (HOSPITAL_COMMUNITY)
Admission: RE | Admit: 2021-06-01 | Discharge: 2021-06-01 | Disposition: A | Discharge status: Home / Self Care | Payer: Medicare Other | Source: Ambulatory Visit | Attending: Internal Medicine | Admitting: Internal Medicine

## 2021-06-01 ENCOUNTER — Other Ambulatory Visit: Payer: Self-pay

## 2021-06-01 ENCOUNTER — Ambulatory Visit (HOSPITAL_COMMUNITY): Payer: Self-pay | Admitting: Pharmacist

## 2021-06-01 VITALS — BP 107/38 | HR 94

## 2021-06-01 DIAGNOSIS — D6851 Activated protein C resistance: Secondary | ICD-10-CM | POA: Insufficient documentation

## 2021-06-01 DIAGNOSIS — Z794 Long term (current) use of insulin: Secondary | ICD-10-CM | POA: Insufficient documentation

## 2021-06-01 DIAGNOSIS — E1122 Type 2 diabetes mellitus with diabetic chronic kidney disease: Secondary | ICD-10-CM | POA: Diagnosis not present

## 2021-06-01 DIAGNOSIS — N186 End stage renal disease: Secondary | ICD-10-CM | POA: Insufficient documentation

## 2021-06-01 DIAGNOSIS — E1151 Type 2 diabetes mellitus with diabetic peripheral angiopathy without gangrene: Secondary | ICD-10-CM | POA: Diagnosis not present

## 2021-06-01 DIAGNOSIS — G8929 Other chronic pain: Secondary | ICD-10-CM | POA: Insufficient documentation

## 2021-06-01 DIAGNOSIS — Z992 Dependence on renal dialysis: Secondary | ICD-10-CM | POA: Insufficient documentation

## 2021-06-01 DIAGNOSIS — I252 Old myocardial infarction: Secondary | ICD-10-CM | POA: Diagnosis not present

## 2021-06-01 DIAGNOSIS — E1143 Type 2 diabetes mellitus with diabetic autonomic (poly)neuropathy: Secondary | ICD-10-CM | POA: Diagnosis not present

## 2021-06-01 DIAGNOSIS — Z09 Encounter for follow-up examination after completed treatment for conditions other than malignant neoplasm: Secondary | ICD-10-CM | POA: Insufficient documentation

## 2021-06-01 DIAGNOSIS — I4892 Unspecified atrial flutter: Secondary | ICD-10-CM | POA: Insufficient documentation

## 2021-06-01 DIAGNOSIS — Z993 Dependence on wheelchair: Secondary | ICD-10-CM | POA: Diagnosis not present

## 2021-06-01 DIAGNOSIS — Z452 Encounter for adjustment and management of vascular access device: Secondary | ICD-10-CM | POA: Insufficient documentation

## 2021-06-01 DIAGNOSIS — I2510 Atherosclerotic heart disease of native coronary artery without angina pectoris: Secondary | ICD-10-CM | POA: Insufficient documentation

## 2021-06-01 DIAGNOSIS — Z7901 Long term (current) use of anticoagulants: Secondary | ICD-10-CM | POA: Insufficient documentation

## 2021-06-01 DIAGNOSIS — I255 Ischemic cardiomyopathy: Secondary | ICD-10-CM | POA: Diagnosis not present

## 2021-06-01 DIAGNOSIS — Z955 Presence of coronary angioplasty implant and graft: Secondary | ICD-10-CM | POA: Diagnosis not present

## 2021-06-01 DIAGNOSIS — Z79899 Other long term (current) drug therapy: Secondary | ICD-10-CM | POA: Diagnosis not present

## 2021-06-01 DIAGNOSIS — Z89611 Acquired absence of right leg above knee: Secondary | ICD-10-CM | POA: Insufficient documentation

## 2021-06-01 DIAGNOSIS — R1031 Right lower quadrant pain: Secondary | ICD-10-CM | POA: Insufficient documentation

## 2021-06-01 DIAGNOSIS — Z7982 Long term (current) use of aspirin: Secondary | ICD-10-CM | POA: Insufficient documentation

## 2021-06-01 DIAGNOSIS — Z95811 Presence of heart assist device: Secondary | ICD-10-CM | POA: Insufficient documentation

## 2021-06-01 DIAGNOSIS — Z89612 Acquired absence of left leg above knee: Secondary | ICD-10-CM | POA: Diagnosis not present

## 2021-06-01 DIAGNOSIS — Z86718 Personal history of other venous thrombosis and embolism: Secondary | ICD-10-CM | POA: Insufficient documentation

## 2021-06-01 DIAGNOSIS — K3184 Gastroparesis: Secondary | ICD-10-CM | POA: Diagnosis not present

## 2021-06-01 DIAGNOSIS — I5022 Chronic systolic (congestive) heart failure: Secondary | ICD-10-CM

## 2021-06-01 DIAGNOSIS — I132 Hypertensive heart and chronic kidney disease with heart failure and with stage 5 chronic kidney disease, or end stage renal disease: Secondary | ICD-10-CM | POA: Diagnosis not present

## 2021-06-01 LAB — POCT INR: INR: 2 (ref 2.0–3.0)

## 2021-06-01 MED ORDER — MIDODRINE HCL 5 MG PO TABS: 10.0000 mg | ORAL_TABLET | ORAL | 6 refills | Status: DC

## 2021-06-01 NOTE — Patient Instructions (Signed)
Decrease Midodrine to 10 mg  (2 pills) prior to dialysis Coumadin dosing per Ander Purpura PharmD 3. Return to [place] clinic in 2 months for follow up with Dr Aundra Dubin

## 2021-06-01 NOTE — Progress Notes (Signed)
LVAD INR 

## 2021-06-01 NOTE — Progress Notes (Addendum)
Patient presents for 2 month follow up visit follow up with his friend. Reports no problems with VAD equipment or concerns with drive line.   Pt is very pleasant today and states that he feels great. States that dialysis is going well, other than excrucriaing pain of right lower quadrant near drive line and near his tunneled catheter towards end of treatment. He has had this in the past.   Pt states that the dialysis staff has been telling him that his BP is high when he arrives for treatment. He is taking 15 mg of Midodrine on the days prior to dialysis.  Denies lightheadedness, dizziness, chest pain, or signs of bleeding. Denies falls. Transferred himself from wheelchair to stretcher today. Pt states that his nausea has been much better lately and that he has not taken any phenergan or Zofran since 2021. Reports good PO intake. PRN Oxy provided by pain clinic for back and groin pain- pain well managed.    Labs received from dialysis were drawn on 05/19/21. Reviewed with Dr [name].   Vital Signs:  Doppler Pressure 112 Automatc BP: 107/38 (58) HR: 94 SR SPO2: 98 % on RA   Weight: UTO lb w/o eqt Last weight: 200 lb   VAD Indication: Destination therapy- Pt is no longer candidate for heart/kidney transplant due to PAD per [facility].    VAD interrogation & Equipment Management: Speed: 5600 Flow: 3.9 Power: 4.6w    PI: 5.0   Alarms: none Events: 10 PI events so far today; 60+ on dialysis days  Fixed speed: 5600 Low speed limit: 5300   Primary Controller: Replace back up battery in 15 months Back up controller:   Replace back up battery in 29 months   Annual Equipment Maintenance on UBC/PM was performed on 03/30/21.    I reviewed the LVAD parameters from today and compared the results to the patient's prior recorded data. LVAD interrogation was NEGATIVE for significant power changes, NEGATIVE for clinical alarms and STABLE for PI events/speed drops. No programming changes were made and  pump is functioning within specified parameters. Pt is performing daily controller and system monitor self tests along with completing weekly and monthly maintenance for LVAD equipment.   LVAD equipment check completed and is in good working order. Back-up equipment present.   Exit Site Care: Drive line is being maintained weekly by daughter. CDI today. No anchor in place today. Pt states he is not using anchors because they irritate his skin. Discussed need to immobilize drive line to prevent trauma. Pt verbalized understanding of same, but states he will not use. Provided patient with 8 weekly dressing kits for home use.  Significant Events on VAD Support:  -10/2016>> poss drive line infection, CT ABD neg, ID consult-doxy -11/09/16>> admit for poss drive line infection, IV abx -03/24/17>> doxy for poss drive line infection -05/04/17>> drive line debridement with wound-vac -06/2017>> drive line +proteus, IV abx, Bactrim x14 days  Device: St jude Dual Therapies: on 200 bpm Last check: 01/01/18 - 2 hrs Afl/Afib; EMI 04/02/18  BP & Labs: Doppler BP 112 - is reflecting modified systolic.   Hgb 11 difficult venous access. Result  from dialysis labs 05/19/21 - No S/S of bleeding. Specifically denies melena/BRBPR or nosebleeds.   LDH 210, difficult venous access. Results from dialysis labs 05/19/21 - Denies tea-colored urine. No power elevations noted on interrogation.     Patient Instructions:  Decrease Midodrine to 10 mg  (2 pills) prior to dialysis Coumadin dosing per [name] PharmD 3.  Return to [facility] in 2 months for follow up with Dr [name] Heck RN [place] Coordinator  Office: [phone]  24/7 Pager: [phone]   Cardiology: Dr. Aundra [name]  HPI: 55 y.o. with history of CAD s/p anterior MI in 2010 and NSTEMI in 3/18 with DES to pRCA and mLAD and ischemic cardiomyopathy now s/p Heartmate 3 LVAD and ESRD presents for followup of CHF/LVAD.  Patient developed low output heart  failure.  He was not deemed to be a good transplant candidate.  He was admitted in 10/18 and Heartmate 3 LVAD was placed.  He had baseline CKD stage 3 likely from diabetic nephropathy and CHF.  He developed peri-op AKI and ended up on dialysis.  No renal recovery to date.  St Jude ICD has been turned back on and ramp echo was done prior to discharge in 10/18, speed increased to 5600 rpm.   He was admitted in 11/18 with nausea/vomiting and abdominal pain.  This appears to have been due to diabetic gastroparesis (extensive GI evaluation). He had atrial flutter this admission that was converted to NSR with amiodarone.  He improved considerably with scheduled Reglan and erythromycin. He was re-admitted later in 11/18 with the same symptoms, suspect diabetic gastroparesis and was again treated with erythromycin.  He was not able to start domperidone due to prolonged QT interval. He had nausea again about a week ago but was able to manage at home with Phenergan suppositories.   Admitted 1/8 -> 09/16/17 for uncontrolled vomiting in setting of diabetic gastroparesis. Improved with phenergan suppositories and given erythromycin TID for 7 days post-discharge.    He was admitted in 2/19 for AV graft placement.  While in the hospital, we turned down his speed.  Since then, he has had considerably fewer PI events on HD days.    He was admitted in 4/19 with nausea/vomiting again, probably gastroparesis flare.   He was admitted in 5/19 with nausea/vomiting, likely gastroparesis flare, treated with erythromycin course which he is finishing.   He has been evaluated at [facility] by Dr. Derrill [name] (gastroparesis specialist).   Gastric emptying study, surprisingly, was near normal. However, electrogastrogram showed poor gastric accomodation/capacity, "bradygastria."  Frequent small meals were recommended. Also, early am nausea was thought to be possible GERD.    Admitted 6/26 - 03/19/18 for emergent VAD exchange 03/01/18 in the  setting of HM3 outflow graft kink. He required CVVHD post op, but was transtioned back to [facility] without problems. Course complicated by leukocytosis and fever. CT abd/chest negative for infection and blood cultures were negative. Post op pain well controlled with rare tramadol. Able to tolerate diet. Evaluated by PT/OT and HH PT recommended for discharge. Ramp echo completed prior to discharge with speed optimization. Resumed 81 mg ASA + coumadin with INR goal 2-3.   He was admitted 03/23/18 with nausea/gastroparesis symptoms and marked HTN at outpatient HD.  Erythromycin was restarted and gastroparesis symptoms were controlled with fall in BP and discharge on 03/25/18.   He was admitted in 9/19 for LUE AV fistula placement.  He was readmitted in 10/19 with his typical gastroparesis symptoms, unable to take po. The fistula later failed and was replaced with a graft.  However, the graft was nonfunctional and developed a hematoma at the surgical site.    He was admitted in 12/19 with nausea/vomiting from HD. Also with LDH increased to 427 in setting of extensive hematoma around his left arm AV graft.  He has had tingling in  his left hand and left grip is weak.    In 7/20, he was admitted with ischemic 4th toe on right foot.  Peripheral angiogram 03/18/19 showed 80% right SFA stenosis and occluded right PT.  He had stenting of SFA and PTCA of PT with restoration of flow.  However, he still needed amputation right 4th toe on 7/16 with intractable pain.  He was readmitted with pain in his right foot, nausea, and vomiting. There was concern for ongoing right foot ischemia with all toes and a portion of the foot now dusky.  He was treated with IV vancomycin for possible osteomyelitis/infection, but thought that more likely ischemia could explain all the findings.  He was discharged home to followup with vascular surgery.   He was re-admitted in 8/20 with ongoing right foot pain and ended up having transmetatarsal  amputation of the right foot.  Left leg angiogram was done with DES to left SFA, only 1 vessel runoff via severely diseased peroneal artery, unable to intervene.   He was readmitted in 9/20 with ongoing right foot/lower leg pain and had right AKA.  He was discharged to [facility].    He was then admitted with gangrene in the remnant of his left foot in 11/20.  He had left AKA.    He returns today for followup of LVAD.  Feeling well overall.  Able to transfer back and forth to his wheelchair and wheels his wheelchair without dyspnea.  MAP has been elevated pre-HD.  He takes hydralazine on non-HD days.   Reports taking Coumadin as prescribed and adherence to anticoagulation based dietary restrictions. Denies BRBPR or melena. No dark urine or hematuria.   Labs (11/18): hgb 10.4 => 8.7, LDH 213 Labs (08/04/17): hgb 9 Labs (1/19): HgbA1c 6.1 Labs (2/19): hgb 13.2 Labs (3/19): hgb 11.1 Labs (7/19): hgb 9.6 Labs (10/19: hgb 11.8 Labs (12/19): LDH 427 => 237, hgb 7.7 Labs (11/20): hgb 8.1 Labs (1/21): HIV negative, RPR negative Labs (3/21): hgb 8.8 Labs (6/21): hgb 12.4 Labs (8/21): hgb 10.8, LDH 201 Labs (10/21): hgb 12.4, LDH 220 Labs (12/21): hgb 12, LDH 228 Labs (3/22): hgb 11.7 Labs (5/22): hgb 10.7, LDH 180  PMH: 1. Chronic systolic CHF: Ischemic cardiomyopathy.  St Jude ICD.  - Echo (11/17) with EF 25-30%, moderate diastolic dysfunction, mild MR, mildly dilated RV with moderately decreased systolic function, peak RV-RA gradient 48 mmHg.  - Echo (3/18) with EF 25-30%, wall motion abnormalities, normal RV size and systolic function.  - Admission 3/18 with cardiogenic shock and AKI, required milrinone gtt.  - Echo (8/18) with EF 20-25%, mild LVH, moderate LV dilation, mild MR.  - CPX (8/18) with peak VO2 12.4, VE/VCO2 slope 35, RER 1.05 => severe HR limitation.  - Heartmate 3 LVAD 10/18.  - Pump thrombosis in setting of HM3 outflow graft kink.  Pump exchange 6/19 for HM3.  2. CAD:  Anterior MI 2005, had bifurcation stenting LAD/diagonal.  Repeat cath 2010 with patent stents.  - NSTEMI 3/18: LHC with 99% ulcerated lesion proximal RCA, 95% mid LAD => PCI with DES to proximal RCA and mid LAD.  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  - Lower extremity venous dopplers (8/18): chronic DVT - V/Q scan (8/18): Normal, no PE.  4. Crohns disease: History of colectomy.  5. GERD 6. Diabetic gastroparesis: Admission 11/18 with abdominal pain, nausea/vomiting.  - [facility] workup 2019: Gastric emptying study near normal.  Negative breath test (no evidence for small bowel bacterial overgrowth).  Electrogastrogram showed  poor gastric accomodation/capacity, "bradygastria."  7. HTN 8. Hyperlipidemia 9. Type II diabetes  10. ESRD: Post-op LVAD surgery.  He has had failed AV fistula and AV graft, now dialyzing via catheter . 11. Carotid dopplers (8/18): mild bilateral stenosis.  12. PFTs (8/18): Mild restriction (no IDL on CT chest).  13. Lung nodules: CT chest 8/18 with small bilateral nodules.  14. PAD: ABIs with moderate bilateral disease in 8/18.  - Peripheral angiogram 03/18/19 showed 80% right SFA stenosis and occluded right PT.  He had stenting of SFA and PTCA of PT with restoration of flow.  However, he still needed amputation right 4th toe on 03/21/19 with intractable pain.  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  - Peripheral angiogram left leg: DES to left SFA, only 1 vessel runoff via severely diseased peroneal artery, unable to intervene. 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  - Left AKA 11/20.  15. Atrial flutter: In 11/18 associated with nausea/vomiting from gastroparesis.   Current Outpatient Medications  Medication Sig Dispense Refill   aspirin EC 81 MG tablet Take 1 tablet (81 mg total) by mouth daily. 90 tablet 3   atorvastatin (LIPITOR) 40 MG tablet Take 1 tablet (40 mg total) by mouth daily at 6 PM. 90 tablet 3   calcitRIOL (ROCALTROL) 0.5 MCG capsule Take 1 capsule (0.5 mcg total) by mouth every  Monday, Wednesday, and Friday. 15 capsule 5   citalopram (CELEXA) 20 MG tablet Take 1 tablet (20 mg total) by mouth daily. 30 tablet 6   hydrALAZINE (APRESOLINE) 25 MG tablet Take 1 tablet three times a day on NON-dialysis days only 270 tablet 3   LANTUS 100 UNIT/ML injection INJECT 35 UNITS TOTAL INTO THE SKIN DAILY. 10 mL 0   LORazepam (ATIVAN) 0.5 MG tablet Take 1 tablet (0.5 mg total) by mouth every 12 (twelve) hours as needed (nausea). 60 tablet 0   metoCLOPramide (REGLAN) 10 MG tablet Take 1 tablet (10 mg total) by mouth 3 (three) times daily. (Patient taking differently: Take 10 mg by mouth 3 (three) times daily before meals.) 90 tablet 6   multivitamin (RENA-VIT) TABS tablet Take 1 tablet by mouth daily.     ondansetron (ZOFRAN-ODT) 4 MG disintegrating tablet Take by mouth as needed.     oxyCODONE (ROXICODONE) 5 MG immediate release tablet Take 1 tablet (5 mg total) by mouth 2 (two) times daily as needed for severe pain. Take 1-2 tablets every 4-6 hours for severe pain. 30 tablet 0   pantoprazole (PROTONIX) 40 MG tablet Take 1 tablet (40 mg total) by mouth 2 (two) times daily before lunch and supper. 60 tablet 11   promethazine (PHENERGAN) 12.5 MG tablet TAKE 1 TABLET (12.5 MG TOTAL) BY MOUTH EVERY 6 (SIX) HOURS AS NEEDED FOR NAUSEA OR VOMITING. 30 tablet 3   sevelamer carbonate (RENVELA) 0.8 g PACK packet Take 0.8 g by mouth 3 (three) times daily with meals. 270 each 1   warfarin (COUMADIN) 5 MG tablet Take 1 tablet (5 mg total) by mouth daily. 30 tablet 11   [START ON 06/02/2021] midodrine (PROAMATINE) 5 MG tablet Take 2 tablets (10 mg total) by mouth every Monday, Wednesday, and Friday. Take 1 tablet before dialysis treatment 60 tablet 6   nitroGLYCERIN (NITROSTAT) 0.4 MG SL tablet Place 1 tablet (0.4 mg total) under the tongue every 5 (five) minutes as needed for chest pain. (Patient not taking: No sig reported) 90 tablet 3   No current facility-administered medications for this encounter.     Metformin  and related   Review of systems complete and found to be negative unless listed in HPI.    BP (!) 107/38   Pulse 94   SpO2 98%   MAP 80   VAD interrogation & Equipment Management: See LVAD nurse's note above.   General: Well appearing this am. NAD.  HEENT: Normal. Neck: Supple, JVP 7-8 cm. Carotids OK.  Cardiac:  Mechanical heart sounds with LVAD hum present.  Lungs:  CTAB, normal effort.  Abdomen:  NT, ND, no HSM. No bruits or masses. +BS  LVAD exit site: Well-healed and incorporated. Dressing dry and intact. No erythema or drainage. Stabilization device present and accurately applied. Driveline dressing changed daily per sterile technique. Extremities:  s/p bilateral AKA  Neuro:  Alert & oriented x 3. Cranial nerves grossly intact. Moves all 4 extremities w/o difficulty. Affect pleasant    ASSESSMENT AND PLAN: 1. PAD/ischemic feet:  He is now s/p bilateral AKAs.  Coping well, able to transfer from bed to wheelchair on his own.  2. Chronic systolic CHF: Ischemic cardiomyopathy.  St Jude ICD.  NSTEMI in March 2018 with DES to LAD and RCA, complicated by cardiogenic shock, low output requiring milrinone.  Echo 8/18 with EF 20-25%, moderate MR. CPX (8/18) with severe functional impairment due to HF.  He is s/p HM3 LVAD placement. He had pump thrombosis 6/19 due to [facility] outflow graft kinking.  He had pump exchange for another HM3.  LVAD parameters stable, no recent low flows with HD. He takes midodrine pre-HD, but MAP now better-controlled on non-HD days with hydralazine. BP high pre-HD. - Decrease pre-HD midodrine to 10 mg.   - Volume managed at dialysis, MWF.   - Continue ASA 81 and warfarin with INR goal 2-2.5.    - Continue hydralazine 25 mg tid on non-HD days.  - He is not candidate for transplant now given extensive PAD with amputations.   3. ESRD: HD on M-W-F with midodrine pre-HD.  - Need to allow UF as long as MAP > 70 (would not stop for MAP 70-80).   - With  rising BP on HD days, decrease midodrine to 10 mg pre-HD.   4.  CAD: NSTEMI in 3/18.  LHC with 99% ulcerated lesion proximal RCA with left to right collaterals, 95% mid LAD stenosis after mid LAD stent.  s/p PCI to RCA and LAD on 11/25/16. No chest pain.  - Continue statin and ASA.  5. h/o DVTs: Factor V Leiden heterozygote. - Anticoagulated. 6. Diabetic gastroparesis: He has seen gastroparesis specialist at [facility]. Surprisingly, gastric emptying study was normal. However, electrogastrogram showed poor gastric accomodation/capacity. Therefore, small frequent meals recommended.  He has been doing much better with this, minimal symptoms.  - Continue reglan 10 mg tid/ac, now off Marinol.  - Continue Protonix bid.  - Limit narcotic use as much as possible.  7. HTN: Takes hydralazine 25 tid on non-HD days.  8. Chronic pain: Now seen in a pain clinic.  9. Atrial flutter: Paroxysmal.  Not seen recently. Now off amiodarone.   Followup in 2 months.   [name] 06/01/2021

## 2021-06-08 ENCOUNTER — Ambulatory Visit (HOSPITAL_COMMUNITY): Payer: Self-pay | Admitting: Pharmacist

## 2021-06-08 LAB — POCT INR: INR: 2.2 (ref 2.0–3.0)

## 2021-06-08 NOTE — Progress Notes (Signed)
LVAD INR 

## 2021-06-15 ENCOUNTER — Ambulatory Visit (HOSPITAL_COMMUNITY): Payer: Self-pay | Admitting: Pharmacist

## 2021-06-15 LAB — POCT INR: INR: 2 (ref 2.0–3.0)

## 2021-06-15 NOTE — Progress Notes (Signed)
LVAD INR 

## 2021-06-22 ENCOUNTER — Ambulatory Visit (HOSPITAL_COMMUNITY): Payer: Self-pay | Admitting: Pharmacist

## 2021-06-22 LAB — POCT INR: INR: 2.1 (ref 2.0–3.0)

## 2021-06-22 NOTE — Progress Notes (Signed)
LVAD INR 

## 2021-06-29 ENCOUNTER — Ambulatory Visit (HOSPITAL_COMMUNITY): Payer: Self-pay | Admitting: Pharmacist

## 2021-06-29 LAB — POCT INR: INR: 2.3 (ref 2.0–3.0)

## 2021-06-29 NOTE — Progress Notes (Signed)
LVAD INR 

## 2021-07-05 ENCOUNTER — Other Ambulatory Visit (HOSPITAL_COMMUNITY): Payer: Self-pay | Admitting: Cardiology

## 2021-07-06 ENCOUNTER — Ambulatory Visit (HOSPITAL_COMMUNITY): Payer: Self-pay | Admitting: Pharmacist

## 2021-07-06 LAB — POCT INR: INR: 2 (ref 2.0–3.0)

## 2021-07-06 NOTE — Progress Notes (Signed)
LVAD INR 

## 2021-07-08 ENCOUNTER — Other Ambulatory Visit (HOSPITAL_COMMUNITY): Payer: Self-pay | Admitting: Cardiology

## 2021-07-14 ENCOUNTER — Ambulatory Visit (HOSPITAL_COMMUNITY): Payer: Self-pay | Admitting: Pharmacist

## 2021-07-14 LAB — POCT INR: INR: 2.1 (ref 2.0–3.0)

## 2021-07-14 NOTE — Progress Notes (Signed)
LVAD INR 

## 2021-07-20 ENCOUNTER — Emergency Department (HOSPITAL_COMMUNITY): Payer: Medicare Other

## 2021-07-20 ENCOUNTER — Emergency Department (HOSPITAL_COMMUNITY)
Admission: EM | Admit: 2021-07-20 | Discharge: 2021-07-20 | Disposition: A | Discharge status: Home / Self Care | Payer: Medicare Other | Attending: Emergency Medicine | Admitting: Emergency Medicine

## 2021-07-20 ENCOUNTER — Other Ambulatory Visit: Payer: Self-pay

## 2021-07-20 DIAGNOSIS — S62324A Displaced fracture of shaft of fourth metacarpal bone, right hand, initial encounter for closed fracture: Secondary | ICD-10-CM | POA: Diagnosis not present

## 2021-07-20 DIAGNOSIS — Z992 Dependence on renal dialysis: Secondary | ICD-10-CM | POA: Diagnosis not present

## 2021-07-20 DIAGNOSIS — I5022 Chronic systolic (congestive) heart failure: Secondary | ICD-10-CM

## 2021-07-20 DIAGNOSIS — Z95811 Presence of heart assist device: Secondary | ICD-10-CM | POA: Diagnosis not present

## 2021-07-20 DIAGNOSIS — Z7901 Long term (current) use of anticoagulants: Secondary | ICD-10-CM | POA: Insufficient documentation

## 2021-07-20 DIAGNOSIS — R52 Pain, unspecified: Secondary | ICD-10-CM

## 2021-07-20 DIAGNOSIS — N186 End stage renal disease: Secondary | ICD-10-CM | POA: Diagnosis not present

## 2021-07-20 DIAGNOSIS — S6991XA Unspecified injury of right wrist, hand and finger(s), initial encounter: Secondary | ICD-10-CM | POA: Diagnosis present

## 2021-07-20 DIAGNOSIS — I63542 Cerebral infarction due to unspecified occlusion or stenosis of left cerebellar artery: Secondary | ICD-10-CM | POA: Insufficient documentation

## 2021-07-20 DIAGNOSIS — M79641 Pain in right hand: Secondary | ICD-10-CM | POA: Insufficient documentation

## 2021-07-20 DIAGNOSIS — Z7982 Long term (current) use of aspirin: Secondary | ICD-10-CM | POA: Insufficient documentation

## 2021-07-20 DIAGNOSIS — S62326A Displaced fracture of shaft of fifth metacarpal bone, right hand, initial encounter for closed fracture: Secondary | ICD-10-CM | POA: Insufficient documentation

## 2021-07-20 DIAGNOSIS — E1122 Type 2 diabetes mellitus with diabetic chronic kidney disease: Secondary | ICD-10-CM | POA: Diagnosis not present

## 2021-07-20 DIAGNOSIS — I2510 Atherosclerotic heart disease of native coronary artery without angina pectoris: Secondary | ICD-10-CM | POA: Insufficient documentation

## 2021-07-20 DIAGNOSIS — Z79899 Other long term (current) drug therapy: Secondary | ICD-10-CM | POA: Diagnosis not present

## 2021-07-20 DIAGNOSIS — I132 Hypertensive heart and chronic kidney disease with heart failure and with stage 5 chronic kidney disease, or end stage renal disease: Secondary | ICD-10-CM | POA: Insufficient documentation

## 2021-07-20 DIAGNOSIS — Y92410 Unspecified street and highway as the place of occurrence of the external cause: Secondary | ICD-10-CM | POA: Insufficient documentation

## 2021-07-20 DIAGNOSIS — S62201A Unspecified fracture of first metacarpal bone, right hand, initial encounter for closed fracture: Secondary | ICD-10-CM | POA: Diagnosis not present

## 2021-07-20 DIAGNOSIS — S6291XA Unspecified fracture of right wrist and hand, initial encounter for closed fracture: Secondary | ICD-10-CM

## 2021-07-20 MED ORDER — MORPHINE SULFATE (PF) 2 MG/ML IV SOLN
1.0000 mg | Freq: Once | INTRAVENOUS | Status: AC
  Administered 2021-07-20: 1 mg via INTRAMUSCULAR
  Filled 2021-07-20: qty 1

## 2021-07-20 MED ORDER — OXYCODONE-ACETAMINOPHEN 5-325 MG PO TABS
1.0000 | ORAL_TABLET | Freq: Once | ORAL | Status: AC
  Administered 2021-07-20: 1 via ORAL
  Filled 2021-07-20: qty 1

## 2021-07-20 NOTE — ED Notes (Signed)
Pt verbalized understanding of d/c instructions, meds and followup care. Denies questions. VSS, no distress noted. W/C to exit with all belongings.

## 2021-07-20 NOTE — ED Notes (Signed)
Pt refusing lab draw at this time

## 2021-07-20 NOTE — ED Notes (Signed)
Got patient on the monitor got patient pants off got patient a warm blanket patient is resting with call bell in reach

## 2021-07-20 NOTE — Progress Notes (Signed)
Orthopedic Tech Progress Note Patient Details:  [name] [birth year] [id number]  Ortho Devices Type of Ortho Device: Ulna gutter splint Ortho Device/Splint Location: RUE Ortho Device/Splint Interventions: Ordered, Application, Adjustment   Post Interventions Patient Tolerated: Well Instructions Provided: Care of device  Janit Pagan 07/20/2021, 12:36 PM
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
## 2021-07-22 ENCOUNTER — Ambulatory Visit (HOSPITAL_COMMUNITY): Payer: Self-pay | Admitting: Pharmacist

## 2021-07-22 LAB — POCT INR: INR: 1.8 — AB (ref 2.0–3.0)

## 2021-07-22 NOTE — Progress Notes (Signed)
LVAD INR 

## 2021-07-27 ENCOUNTER — Ambulatory Visit (HOSPITAL_COMMUNITY): Payer: Self-pay | Admitting: Pharmacist

## 2021-07-27 LAB — POCT INR: INR: 2.7 (ref 2.0–3.0)

## 2021-07-27 NOTE — Progress Notes (Signed)
LVAD INR 

## 2021-08-03 ENCOUNTER — Telehealth: Payer: Self-pay

## 2021-08-03 ENCOUNTER — Ambulatory Visit (HOSPITAL_COMMUNITY): Payer: Self-pay | Admitting: Pharmacist

## 2021-08-03 ENCOUNTER — Other Ambulatory Visit: Payer: Self-pay

## 2021-08-03 ENCOUNTER — Ambulatory Visit (HOSPITAL_COMMUNITY)
Admission: RE | Admit: 2021-08-03 | Discharge: 2021-08-03 | Disposition: A | Discharge status: Home / Self Care | Payer: Medicare Other | Source: Ambulatory Visit | Attending: Cardiology | Admitting: Cardiology

## 2021-08-03 VITALS — BP 140/0 | HR 97 | Wt 167.5 lb

## 2021-08-03 DIAGNOSIS — I252 Old myocardial infarction: Secondary | ICD-10-CM | POA: Diagnosis not present

## 2021-08-03 DIAGNOSIS — I132 Hypertensive heart and chronic kidney disease with heart failure and with stage 5 chronic kidney disease, or end stage renal disease: Secondary | ICD-10-CM | POA: Insufficient documentation

## 2021-08-03 DIAGNOSIS — Z89612 Acquired absence of left leg above knee: Secondary | ICD-10-CM | POA: Diagnosis not present

## 2021-08-03 DIAGNOSIS — Z7901 Long term (current) use of anticoagulants: Secondary | ICD-10-CM | POA: Insufficient documentation

## 2021-08-03 DIAGNOSIS — I2510 Atherosclerotic heart disease of native coronary artery without angina pectoris: Secondary | ICD-10-CM | POA: Diagnosis not present

## 2021-08-03 DIAGNOSIS — Z452 Encounter for adjustment and management of vascular access device: Secondary | ICD-10-CM | POA: Diagnosis present

## 2021-08-03 DIAGNOSIS — Z955 Presence of coronary angioplasty implant and graft: Secondary | ICD-10-CM | POA: Insufficient documentation

## 2021-08-03 DIAGNOSIS — Z992 Dependence on renal dialysis: Secondary | ICD-10-CM | POA: Insufficient documentation

## 2021-08-03 DIAGNOSIS — Z95811 Presence of heart assist device: Secondary | ICD-10-CM | POA: Insufficient documentation

## 2021-08-03 DIAGNOSIS — I255 Ischemic cardiomyopathy: Secondary | ICD-10-CM | POA: Diagnosis not present

## 2021-08-03 DIAGNOSIS — Z9049 Acquired absence of other specified parts of digestive tract: Secondary | ICD-10-CM | POA: Diagnosis not present

## 2021-08-03 DIAGNOSIS — I5022 Chronic systolic (congestive) heart failure: Secondary | ICD-10-CM | POA: Diagnosis not present

## 2021-08-03 DIAGNOSIS — E785 Hyperlipidemia, unspecified: Secondary | ICD-10-CM | POA: Insufficient documentation

## 2021-08-03 DIAGNOSIS — K5090 Crohn's disease, unspecified, without complications: Secondary | ICD-10-CM | POA: Diagnosis not present

## 2021-08-03 DIAGNOSIS — Z86718 Personal history of other venous thrombosis and embolism: Secondary | ICD-10-CM | POA: Diagnosis not present

## 2021-08-03 DIAGNOSIS — E1122 Type 2 diabetes mellitus with diabetic chronic kidney disease: Secondary | ICD-10-CM | POA: Insufficient documentation

## 2021-08-03 DIAGNOSIS — K219 Gastro-esophageal reflux disease without esophagitis: Secondary | ICD-10-CM | POA: Diagnosis not present

## 2021-08-03 DIAGNOSIS — I4892 Unspecified atrial flutter: Secondary | ICD-10-CM | POA: Diagnosis not present

## 2021-08-03 DIAGNOSIS — Z89611 Acquired absence of right leg above knee: Secondary | ICD-10-CM | POA: Diagnosis not present

## 2021-08-03 DIAGNOSIS — N186 End stage renal disease: Secondary | ICD-10-CM | POA: Insufficient documentation

## 2021-08-03 LAB — POCT INR: INR: 2.5 (ref 2.0–3.0)

## 2021-08-03 NOTE — Progress Notes (Signed)
LVAD INR 
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
## 2021-08-03 NOTE — Telephone Encounter (Addendum)
[facility] alert for VF with therapy Device was interrogated 11/29 @ 09:12, no documentation noted in EPIC Event occurred 11/28 @ 12:20 am, EGM show sustained VT/VF with unsuccessful ATP followed by HV therapy 30J successfully converting 2 NSVT, 1:1 There are also 9 AMS since last transmission, all <22mn, PAT Route to triage for awareness LR  Called and spoke with [facility]ony Hail [facility] to inform of successful VF therapy as [facility] [place] [facility] [place]. AEbony Hailwill update STanda Rockersand Dr. MAundra Dubin Will route note to Dr. MAundra Dubinhigh alert.

## 2021-08-03 NOTE — Patient Instructions (Signed)
No change in medications Return to clinic in 2 months

## 2021-08-10 ENCOUNTER — Ambulatory Visit (HOSPITAL_COMMUNITY): Payer: Self-pay | Admitting: Pharmacist

## 2021-08-10 LAB — POCT INR: INR: 2.6 (ref 2.0–3.0)

## 2021-08-10 NOTE — Progress Notes (Signed)
LVAD INR 

## 2021-08-17 ENCOUNTER — Ambulatory Visit (HOSPITAL_COMMUNITY): Payer: Self-pay | Admitting: Pharmacist

## 2021-08-17 LAB — POCT INR: INR: 2.7 (ref 2.0–3.0)

## 2021-08-17 NOTE — Progress Notes (Signed)
LVAD INR 

## 2021-08-18 ENCOUNTER — Ambulatory Visit (INDEPENDENT_AMBULATORY_CARE_PROVIDER_SITE_OTHER): Payer: Medicare Other | Admitting: Internal Medicine

## 2021-08-18 ENCOUNTER — Other Ambulatory Visit: Payer: Self-pay

## 2021-08-18 ENCOUNTER — Encounter: Payer: Self-pay | Admitting: Internal Medicine

## 2021-08-18 VITALS — BP 112/0 | HR 89 | Wt 165.0 lb

## 2021-08-18 DIAGNOSIS — I5022 Chronic systolic (congestive) heart failure: Secondary | ICD-10-CM

## 2021-08-18 DIAGNOSIS — Z95811 Presence of heart assist device: Secondary | ICD-10-CM

## 2021-08-18 DIAGNOSIS — Z95810 Presence of automatic (implantable) cardiac defibrillator: Secondary | ICD-10-CM
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
## 2021-08-18 NOTE — Patient Instructions (Signed)
Medication Instructions:  Your physician recommends that you continue on your current medications as directed. Please refer to the Current Medication list given to you today.  *If you need a refill on your cardiac medications before your next appointment, please call your pharmacy*   Lab Work: None ordered.  If you have labs (blood work) drawn today and your tests are completely normal, you will receive your results only by: [place] (if you have MyChart) OR A paper copy in the mail If you have any lab test that is abnormal or we need to change your treatment, we will call you to review the results.   Testing/Procedures: None ordered.    Follow-Up: At [facility], you and your health needs are our priority.  As part of our continuing mission to provide you with exceptional heart care, we have created designated Provider Care Teams.  These Care Teams include your primary Cardiologist (physician) and Advanced Practice Providers (APPs -  Physician Assistants and Nurse Practitioners) who all work together to provide you with the care you need, when you need it.  We recommend signing up for the patient portal called "MyChart".  Sign up information is provided on this After Visit Summary.  MyChart is used to connect with patients for Virtual Visits (Telemedicine).  Patients are able to view lab/test results, encounter notes, upcoming appointments, etc.  Non-urgent messages can be sent to your provider as well.   To learn more about what you can do with MyChart, go to [email or web address].    Your next appointment:   12 months with Dr Caryl Comes

## 2021-08-18 NOTE — Progress Notes (Deleted)
Patient Care Team: Gerlene Fee, DO as PCP - General (Family Medicine) Larey Dresser, MD as PCP - [facility] ([facility], [facility] Louann Liv, [place] as Social Worker (Licensed Clinical Social Worker)   HPI  [name] is a 55 y.o. male Seen In followup for an [place] ICD implanted 2010 for primary prevention and generator replacement 1/18   THx  prior MI, prior stenting and ischemic cardiomyopathy with an ejection fraction of 15-20% and patent stents . ESRD on HD    He then was admitted 3/18 with acute kidney failure cardiac failure requiring inotropic support. He underwent a 25 pound diuresis. Elevated troponins prompted catheterization and he had an RCA and LAD stent placed  Echo EF was 20-25% 7/22 echo EF 20-25%   VAD implanted. 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 Jude's        Benign neoplasm of colon    CHF (congestive heart failure) (HCC)    Chronic systolic heart failure (HCC)    Coronary artery disease     Widely patent previously placed stents in the left anterior    Crohn's disease ([place])    Deep venous thrombosis ([place])    Recurrent-on Coumadin   Dialysis patient ([place])    Dyspnea    Gastroparesis    GERD (gastroesophageal reflux disease)    High cholesterol    Hyperlipidemia    Hypersomnolent    Previous diagnosis of narcolepsy   Hypertension, essential    Ischemic cardiomyopathy    Ejection fraction 15-20% catheterization 2010   Type II or unspecified type diabetes mellitus without mention of complication, not stated as uncontrolled    Unspecified gastritis and gastroduodenitis without mention  of hemorrhage     Past Surgical History:  Procedure Laterality Date   A/V SHUNTOGRAM N/A 05/24/2018   Procedure: A/V SHUNTOGRAM;  Surgeon: Marty Heck, MD;  Location: [place] [facility];  Service: Cardiovascular;  Laterality: N/A;   ABDOMINAL AORTOGRAM W/LOWER EXTREMITY N/A 03/18/2019   Procedure: ABDOMINAL AORTOGRAM W/LOWER EXTREMITY;  Surgeon: Waynetta Sandy, MD;  Location: [place] [facility];  Service: Cardiovascular;  Laterality: N/A;   AMPUTATION Right 03/21/2019   Procedure: AMPUTATION DIGIT RIGHT FOURTH TOE;  Surgeon: Waynetta Sandy, MD;  Location: [place];  Service: Vascular;  Laterality: Right;   AMPUTATION Left 05/29/2019   Procedure: AMPUTATION LEFT GREAT TOE;  Surgeon: Serafina Mitchell, MD;  Location: [place];  Service: Vascular;  Laterality: Left;   AMPUTATION Right 06/02/2019   Procedure: ABOVE KNEE AMPUTATION;  Surgeon: Rosetta Posner, MD;  Location: [place];  Service: Vascular;  Laterality: Right;   AMPUTATION Left 07/23/2019   Procedure: AMPUTATION ABOVE KNEE LEFT;  Surgeon: Elam Dutch, MD;  Location: [place];  Service: Vascular;  Laterality: Left;   APPENDECTOMY     AV FISTULA PLACEMENT Right 06/26/2017   Procedure: ARTERIOVENOUS (AV) FISTULA CREATION VERSUS GRAFT RIGHT  ARM;  Surgeon: Conrad [place], MD;  Location: [place];  Service: Vascular;  Laterality: Right;   AV FISTULA PLACEMENT Right 10/31/2017   Procedure: INSERTION OF ARTERIOVENOUS (AV) ARTEGRAFT,  RIGHT UPPER ARM;  Surgeon: Adele Barthel  L, MD;  Location: [place];  Service: Vascular;  Laterality: Right;   AV FISTULA PLACEMENT Left 05/29/2018   Procedure: ARTERIOVENOUS (AV) FISTULA CREATION  LEFT UPPER EXTREMITY;  Surgeon: Waynetta Sandy, MD;  Location: [place];  Service: Vascular;  Laterality: Left;   AV FISTULA PLACEMENT Left 08/09/2018   Procedure: INSERTION OF ARTERIOVENOUS (AV) GORE-TEX GRAFT LEFT UPPER ARM;  Surgeon: Waynetta Sandy, MD;  Location: [place];  Service: Vascular;   Laterality: Left;   CARDIAC DEFIBRILLATOR PLACEMENT  2010   St. Jude ICD   CENTRAL LINE INSERTION Right 05/24/2018   Procedure: CENTRAL LINE INSERTION;  Surgeon: Marty Heck, MD;  Location: [place] [facility];  Service: Cardiovascular;  Laterality: Right;   COLECTOMY  ~ 2003   "for Crohn's" ascending colon.     CORONARY STENT INTERVENTION N/A 11/25/2016   Procedure: Coronary Stent Intervention;  Surgeon: Leonie Man, MD;  Location: [place] [facility];  Service: Cardiovascular;  Laterality: N/A;   EP IMPLANTABLE DEVICE N/A 09/14/2016   Procedure: ICD Generator Changeout;  Surgeon: Deboraha Sprang, MD;  Location: [place] [facility];  Service: Cardiovascular;  Laterality: N/A;   ESOPHAGOGASTRODUODENOSCOPY N/A 07/12/2017   Procedure: ESOPHAGOGASTRODUODENOSCOPY (EGD);  Surgeon: Jerene Bears, MD;  Location: [facility];  Service: Gastroenterology;  Laterality: N/A;  VAD pt so VAD team will need to accompany pt.    FETAL SURGERY FOR CONGENITAL HERNIA     ????? pt has no knowledge of this.[name]     FISTULOGRAM Left 08/09/2018   Procedure: FISTULOGRAM LEFT ARM;  Surgeon: Waynetta Sandy, MD;  Location: [place];  Service: Vascular;  Laterality: Left;   INGUINAL HERNIA REPAIR     INSERTION OF DIALYSIS CATHETER Right 06/26/2017   Procedure: INSERTION OF TUNNELED  DIALYSIS CATHETER RIGHT INTERNAL JUGULAR;  Surgeon: Conrad [place], MD;  Location: [place];  Service: Vascular;  Laterality: Right;   INSERTION OF IMPLANTABLE LEFT VENTRICULAR ASSIST DEVICE N/A 06/12/2017   Procedure: INSERTION OF IMPLANTABLE LEFT VENTRICULAR ASSIST [place] 3;  Surgeon: Ivin Poot, MD;  Location: [place];  Service: Open Heart Surgery;  Laterality: N/A;  HM3 LVAD  CIRC ARREST  NITRIC OXIDE   INSERTION OF IMPLANTABLE LEFT VENTRICULAR ASSIST DEVICE N/A 02/28/2018   Procedure: REDO INSERTION OF IMPLANTABLE LEFT VENTRICULAR ASSIST DEVICE - HEARTMATE 3;  Surgeon: Ivin Poot, MD;  Location: [place];   Service: Open Heart Surgery;  Laterality: N/A;   IR FLUORO GUIDE CV LINE RIGHT  07/22/2019   IR REMOVAL TUN CV CATH W/O FL  02/26/2018   IR THORACENTESIS ASP PLEURAL SPACE W/IMG GUIDE  06/28/2017   IR US GUIDE VASC ACCESS RIGHT  07/22/2019   LIGATION OF ARTERIOVENOUS  FISTULA Right 10/31/2017   Procedure: LIGATION OF BRACHIO-BASILIC VEIN TRANSPOSITION RIGHT ARM;  Surgeon: Conrad Dongola, MD;  Location: [place];  Service: Vascular;  Laterality: Right;   LOWER EXTREMITY ANGIOGRAPHY  05/02/2019   Procedure: Lower Extremity Angiography;  Surgeon: Marty Heck, MD;  Location: [place] [facility];  Service: Cardiovascular;;   MULTIPLE EXTRACTIONS WITH ALVEOLOPLASTY N/A 06/08/2017   Procedure: MULTIPLE EXTRACTION WITH ALVEOLOPLASTY AND PRE PROSTHETIC SURGERY AS NEEDED;  Surgeon: Lenn Cal, DDS;  Location: [place];  Service: Oral Surgery;  Laterality: N/A;   PERIPHERAL VASCULAR BALLOON ANGIOPLASTY  03/18/2019   Procedure: PERIPHERAL VASCULAR BALLOON ANGIOPLASTY;  Surgeon: Waynetta Sandy, MD;  Location: [place] [facility];  Service: Cardiovascular;;  RT PT  PERIPHERAL VASCULAR INTERVENTION  03/18/2019   Procedure: PERIPHERAL VASCULAR INTERVENTION;  Surgeon: Waynetta Sandy, MD;  Location: [place] [facility];  Service: Cardiovascular;;  RT SFA   PERIPHERAL VASCULAR INTERVENTION  05/02/2019   Procedure: PERIPHERAL VASCULAR INTERVENTION;  Surgeon: Marty Heck, MD;  Location: [place] [facility];  Service: Cardiovascular;;  Lt. SFA   RIGHT HEART CATH N/A 06/07/2017   Procedure: RIGHT HEART CATH;  Surgeon: Larey Dresser, MD;  Location: [place] [facility];  Service: Cardiovascular;  Laterality: N/A;   RIGHT HEART CATH N/A 02/08/2018   Procedure: RIGHT HEART CATH;  Surgeon: Larey Dresser, MD;  Location: [place] [facility];  Service: Cardiovascular;  Laterality: N/A;   RIGHT HEART CATH N/A 08/09/2018   Procedure: RIGHT HEART CATH;  Surgeon: Larey Dresser, MD;  Location: [place] [facility];  Service: Cardiovascular;  Laterality: N/A;   RIGHT/LEFT HEART CATH AND CORONARY ANGIOGRAPHY N/A 11/23/2016   Procedure: Right/Left Heart Cath and Coronary Angiography;  Surgeon: Larey Dresser, MD;  Location: [place] [facility];  Service: Cardiovascular;  Laterality: N/A;   TEE WITHOUT CARDIOVERSION N/A 06/12/2017   Procedure: TRANSESOPHAGEAL ECHOCARDIOGRAM (TEE);  Surgeon: Prescott Gum, Collier Salina, MD;  Location: [place];  Service: Open Heart Surgery;  Laterality: N/A;   TEE WITHOUT CARDIOVERSION N/A 02/28/2018   Procedure: TRANSESOPHAGEAL ECHOCARDIOGRAM (TEE);  Surgeon: Prescott Gum, Collier Salina, MD;  Location: [place];  Service: Open Heart Surgery;  Laterality: N/A;   TEMPORARY DIALYSIS CATHETER Left 05/24/2018   Procedure: TEMPORARY DIALYSIS CATHETER;  Surgeon: Marty Heck, MD;  Location: [place] [facility];  Service: Cardiovascular;  Laterality: Left;   THROMBECTOMY AND REVISION OF ARTERIOVENTOUS (AV) GORETEX  GRAFT Right 12/12/2017   Procedure: THROMBECTOMY ARTERIOVENTOUS (AV) GORETEX  GRAFT RIGHT UPPER ARM;  Surgeon: Conrad [place], MD;  Location: [place];  Service: Vascular;  Laterality: Right;   TRANSMETATARSAL AMPUTATION Right 04/30/2019   Procedure: RIGHT TRANSMETATARSAL AMPUTATION;  Surgeon: Waynetta Sandy, MD;  Location: [place];  Service: Vascular;  Laterality: Right;    Current Outpatient Medications  Medication Sig Dispense Refill   aspirin EC 81 MG tablet Take 1 tablet (81 mg total) by mouth daily. 90 tablet 3   atorvastatin (LIPITOR) 40 MG tablet Take 1 tablet (40 mg total) by mouth daily at 6 PM. 90 tablet 3   calcitRIOL (ROCALTROL) 0.5 MCG capsule Take 1 capsule (0.5 mcg total) by mouth every Monday, Wednesday, and Friday. 15 capsule 5   citalopram (CELEXA) 20 MG tablet Take 1 tablet (20 mg total) by mouth daily. 30 tablet 6   hydrALAZINE (APRESOLINE) 25 MG tablet Take 1 tablet three times a day on NON-dialysis days only 270 tablet 3   LANTUS 100 UNIT/ML injection  INJECT 35 UNITS TOTAL INTO THE SKIN DAILY. 10 mL 0   LORazepam (ATIVAN) 0.5 MG tablet Take 1 tablet (0.5 mg total) by mouth every 12 (twelve) hours as needed (nausea). 60 tablet 0   metoCLOPramide (REGLAN) 10 MG tablet Take 1 tablet (10 mg total) by mouth 3 (three) times daily. (Patient taking differently: Take 10 mg by mouth 3 (three) times daily before meals.) 90 tablet 6   midodrine (PROAMATINE) 5 MG tablet Take 2 tablets (10 mg total) by mouth every Monday, Wednesday, and Friday. Take 1 tablet before dialysis treatment 60 tablet 6   multivitamin (RENA-VIT) TABS tablet Take 1 tablet by mouth daily.     nitroGLYCERIN (NITROSTAT) 0.4 MG SL tablet Place 1  tablet (0.4 mg total) under the tongue every 5 (five) minutes as needed for chest pain. (Patient not taking: Reported on 01/26/2021) 90 tablet 3   ondansetron (ZOFRAN-ODT) 4 MG disintegrating tablet Take by mouth as needed. (Patient not taking: Reported on 08/03/2021)     oxyCODONE (ROXICODONE) 5 MG immediate release tablet Take 1 tablet (5 mg total) by mouth 2 (two) times daily as needed for severe pain. Take 1-2 tablets every 4-6 hours for severe pain. 30 tablet 0   oxyCODONE-acetaminophen (PERCOCET) 7.5-325 MG tablet Take 1 tablet by mouth 3 (three) times daily as needed for pain.     pantoprazole (PROTONIX) 40 MG tablet Take 1 tablet (40 mg total) by mouth 2 (two) times daily before lunch and supper. 60 tablet 11   promethazine (PHENERGAN) 12.5 MG tablet TAKE 1 TABLET (12.5 MG TOTAL) BY MOUTH EVERY 6 (SIX) HOURS AS NEEDED FOR NAUSEA OR VOMITING. 30 tablet 3   sevelamer carbonate (RENVELA) 0.8 g PACK packet Take 0.8 g by mouth 3 (three) times daily with meals. 270 each 1   warfarin (COUMADIN) 5 MG tablet TAKE 1 TABLET BY MOUTH EVERY DAY 30 tablet 11   No current facility-administered medications for this visit.    Allergies  Allergen Reactions   Metformin And Related Diarrhea    Review of Systems negative except from HPI and PMH  Physical  Exam.pped There were no vitals taken for this visit. ,Well developed and well nourished in no acute distress HENT normal Neck supple with JVP-flat Clear Device pocket well healed; without hematoma or erythema.  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  Device function normal.  Interval atrial tachycardia with a modestly rapid ventricular response but not encroaching upon his device detection rates  Discussed also the role of an ICD in the context of VAD and the decision some time to inactivate the ICD and also not to replace the ICD

## 2021-08-24 ENCOUNTER — Ambulatory Visit (HOSPITAL_COMMUNITY): Payer: Self-pay | Admitting: Pharmacist

## 2021-08-24 LAB — POCT INR: INR: 2.5 (ref 2.0–3.0)

## 2021-08-24 NOTE — Progress Notes (Signed)
LVAD INR 

## 2021-08-25 ENCOUNTER — Other Ambulatory Visit: Payer: Self-pay

## 2021-08-25 ENCOUNTER — Encounter: Payer: Self-pay | Admitting: Family Medicine

## 2021-08-25 ENCOUNTER — Ambulatory Visit (INDEPENDENT_AMBULATORY_CARE_PROVIDER_SITE_OTHER): Payer: Medicare Other | Admitting: Family Medicine

## 2021-08-25 VITALS — BP 109/89 | HR 95

## 2021-08-25 DIAGNOSIS — L0201 Cutaneous abscess of face: Secondary | ICD-10-CM

## 2021-08-25 MED ORDER — CLINDAMYCIN HCL 300 MG PO CAPS: 300.0000 mg | ORAL_CAPSULE | Freq: Four times a day (QID) | ORAL | 0 refills | Status: AC

## 2021-08-25 NOTE — Patient Instructions (Addendum)
It was nice seeing you today!  Take antibiotics 4 times a day for the next 10 days.  Use Vaseline on the area.  Do NOT mess with the area or try to pop it.  Follow up in 2 weeks to make sure it is improving.  Please arrive at least 15 minutes prior to your scheduled appointments.  Stay well, [name] Button, MD [place] [phone]

## 2021-08-25 NOTE — Progress Notes (Signed)
° ° °  SUBJECTIVE:   CHIEF COMPLAINT / HPI: Boil on face  Here with son today  Patient reports boil on his face which has been present for about 2 weeks but has been getting bigger over the past several days.  He is concerned because there is some ulceration.  He has been trying to squeeze it but he has not tried to pop it.  He was able to express a small amount of white discharge from it.  Past is not painful.  Denies fever and chills.  PERTINENT  PMH / PSH: ESRD on HD, Crohn's disease, anticoagulation on warfarin, T2DM  OBJECTIVE:   BP 109/89    Pulse 95    SpO2 97%   General: Middle-age male, wheelchair-bound, NAD Derm: Approximately 2 cm firm, mobile lesion below right side of lip with central area of ulceration which is nontender     ASSESSMENT/PLAN:   Abscess Clinically feels more like a simple cyst given lack of tenderness but given rapid growth and discharge, will treat for possible abscess.  Ulceration likely from trauma in the setting of anticoagulated state. - clindamycin x 10 days for MRSA coverage (no interaction with warfarin) - Vaseline to area - f/u 2 weeks, consider derm referral for drainage if not improving   Zola Button, MD [place]

## 2021-08-26 ENCOUNTER — Telehealth: Payer: Self-pay

## 2021-08-26 NOTE — Telephone Encounter (Signed)
Patient calls nurse line reproting difficulties picking up clindamycin prescription. I called the pharmacy and they report prescription was never received.   Clindamycin #40 verbally given to pharmacist.

## 2021-08-31 ENCOUNTER — Ambulatory Visit (HOSPITAL_COMMUNITY): Payer: Self-pay | Admitting: Pharmacist

## 2021-08-31 LAB — POCT INR: INR: 2.2 (ref 2.0–3.0)

## 2021-08-31 NOTE — Progress Notes (Signed)
LVAD INR 

## 2021-09-07 ENCOUNTER — Ambulatory Visit (HOSPITAL_COMMUNITY): Payer: Self-pay | Admitting: Pharmacist

## 2021-09-07 LAB — POCT INR: INR: 2.1 (ref 2.0–3.0)

## 2021-09-07 NOTE — Progress Notes (Signed)
LVAD INR 

## 2021-09-08 ENCOUNTER — Other Ambulatory Visit: Payer: Self-pay

## 2021-09-08 ENCOUNTER — Ambulatory Visit (INDEPENDENT_AMBULATORY_CARE_PROVIDER_SITE_OTHER): Payer: Medicare Other | Admitting: Student

## 2021-09-08 ENCOUNTER — Encounter: Payer: Self-pay | Admitting: Student

## 2021-09-08 VITALS — BP 115/43 | HR 84

## 2021-09-08 DIAGNOSIS — L0201 Cutaneous abscess of face: Secondary | ICD-10-CM | POA: Diagnosis not present

## 2021-09-08 NOTE — Patient Instructions (Signed)
Mr. Dines, It is such a joy to take care you! Thank you for coming in today.   As a reminder, here is a recap of what we talked about today:  -Your face looks much better today.  I think that we have sufficiently treated the infection and that you do not need any further antibiotics or treatment.  If you notice that it starts to come back and starts to crust over like it was before, please come back and see [place]!  Take care and seek immediate care sooner if you develop any concerns.   Marnee Guarneri, MD [place]

## 2021-09-08 NOTE — Progress Notes (Signed)
° ° °  SUBJECTIVE:   CHIEF COMPLAINT / HPI:   Facial Abscess Patient presents today to follow-up facial abscess. He was seen by Dr. Nancy Fetter on 12/21 and was treated with clindamycin x10 days. He says that is much better today. Denies and residual pain or irritation. Denies any fever, chills, systemic symptoms.   PERTINENT  PMH / PSH: ESRD on HD, CHF, NSTEMI, LVAD dependent,   OBJECTIVE:   BP (!) 115/43    Pulse 84    SpO2 100%   General: NAD Derm: Area of depilation in beard where abscess was noted on previous exam.  No residual warmth, erythema, fluctuance.  Small area of hypopigmentation consistent with healing ulcer as noted previously.    ASSESSMENT/PLAN:   Facial abscess Resolved with 10 day course of clindamycin. No further workup or intervention necessary at this time.      Pearla Dubonnet, MD [place]

## 2021-09-08 NOTE — Assessment & Plan Note (Signed)
Resolved with 10 day course of clindamycin. No further workup or intervention necessary at this time.

## 2021-09-14 ENCOUNTER — Ambulatory Visit (HOSPITAL_COMMUNITY): Payer: Self-pay | Admitting: Pharmacist

## 2021-09-14 LAB — POCT INR: INR: 2.8 (ref 2.0–3.0)

## 2021-09-14 NOTE — Progress Notes (Signed)
LVAD INR 

## 2021-09-21 ENCOUNTER — Ambulatory Visit (HOSPITAL_COMMUNITY): Payer: Self-pay | Admitting: Pharmacist

## 2021-09-21 ENCOUNTER — Other Ambulatory Visit: Payer: Self-pay | Admitting: Cardiology

## 2021-09-21 LAB — POCT INR: INR: 2.8 (ref 2.0–3.0)

## 2021-09-21 NOTE — Progress Notes (Signed)
LVAD INR 

## 2021-09-28 ENCOUNTER — Ambulatory Visit (HOSPITAL_COMMUNITY): Payer: Self-pay | Admitting: Pharmacist

## 2021-09-28 LAB — POCT INR: INR: 2.4 (ref 2.0–3.0)

## 2021-09-28 NOTE — Progress Notes (Signed)
LVAD INR 

## 2021-10-02 ENCOUNTER — Other Ambulatory Visit: Payer: Self-pay | Admitting: Family Medicine

## 2021-10-02 DIAGNOSIS — E118 Type 2 diabetes mellitus with unspecified complications: Secondary | ICD-10-CM

## 2021-10-02 DIAGNOSIS — Z794 Long term (current) use of insulin: Secondary | ICD-10-CM

## 2021-10-05 ENCOUNTER — Ambulatory Visit (HOSPITAL_COMMUNITY): Payer: Self-pay | Admitting: Pharmacist

## 2021-10-05 LAB — POCT INR: INR: 2.7 (ref 2.0–3.0)

## 2021-10-05 NOTE — Progress Notes (Signed)
LVAD INR 

## 2021-10-07 ENCOUNTER — Ambulatory Visit (HOSPITAL_COMMUNITY)
Admission: RE | Admit: 2021-10-07 | Discharge: 2021-10-07 | Disposition: A | Discharge status: Home / Self Care | Payer: Medicare Other | Source: Ambulatory Visit | Attending: Internal Medicine | Admitting: Internal Medicine

## 2021-10-07 ENCOUNTER — Other Ambulatory Visit: Payer: Self-pay

## 2021-10-07 VITALS — BP 118/0 | HR 95

## 2021-10-07 DIAGNOSIS — Z86718 Personal history of other venous thrombosis and embolism: Secondary | ICD-10-CM | POA: Diagnosis not present

## 2021-10-07 DIAGNOSIS — Z09 Encounter for follow-up examination after completed treatment for conditions other than malignant neoplasm: Secondary | ICD-10-CM | POA: Insufficient documentation

## 2021-10-07 DIAGNOSIS — Z89611 Acquired absence of right leg above knee: Secondary | ICD-10-CM

## 2021-10-07 DIAGNOSIS — I2510 Atherosclerotic heart disease of native coronary artery without angina pectoris: Secondary | ICD-10-CM | POA: Insufficient documentation

## 2021-10-07 DIAGNOSIS — Z7901 Long term (current) use of anticoagulants: Secondary | ICD-10-CM | POA: Diagnosis not present

## 2021-10-07 DIAGNOSIS — I4892 Unspecified atrial flutter: Secondary | ICD-10-CM | POA: Insufficient documentation

## 2021-10-07 DIAGNOSIS — Z992 Dependence on renal dialysis: Secondary | ICD-10-CM

## 2021-10-07 DIAGNOSIS — K3184 Gastroparesis: Secondary | ICD-10-CM | POA: Diagnosis not present

## 2021-10-07 DIAGNOSIS — G8918 Other acute postprocedural pain: Secondary | ICD-10-CM | POA: Insufficient documentation

## 2021-10-07 DIAGNOSIS — Z79899 Other long term (current) drug therapy: Secondary | ICD-10-CM | POA: Diagnosis not present

## 2021-10-07 DIAGNOSIS — I132 Hypertensive heart and chronic kidney disease with heart failure and with stage 5 chronic kidney disease, or end stage renal disease: Secondary | ICD-10-CM | POA: Diagnosis not present

## 2021-10-07 DIAGNOSIS — Z95811 Presence of heart assist device: Secondary | ICD-10-CM | POA: Diagnosis not present

## 2021-10-07 DIAGNOSIS — Z7982 Long term (current) use of aspirin: Secondary | ICD-10-CM | POA: Diagnosis not present

## 2021-10-07 DIAGNOSIS — Z89612 Acquired absence of left leg above knee: Secondary | ICD-10-CM | POA: Diagnosis not present

## 2021-10-07 DIAGNOSIS — N186 End stage renal disease: Secondary | ICD-10-CM

## 2021-10-07 DIAGNOSIS — E1143 Type 2 diabetes mellitus with diabetic autonomic (poly)neuropathy: Secondary | ICD-10-CM | POA: Diagnosis not present

## 2021-10-07 DIAGNOSIS — I5022 Chronic systolic (congestive) heart failure: Secondary | ICD-10-CM | POA: Diagnosis present

## 2021-10-07 DIAGNOSIS — E1122 Type 2 diabetes mellitus with diabetic chronic kidney disease: Secondary | ICD-10-CM | POA: Diagnosis not present

## 2021-10-07 DIAGNOSIS — I255 Ischemic cardiomyopathy: Secondary | ICD-10-CM | POA: Diagnosis not present

## 2021-10-07 DIAGNOSIS — D6851 Activated protein C resistance: Secondary | ICD-10-CM | POA: Insufficient documentation

## 2021-10-07 DIAGNOSIS — I252 Old myocardial infarction: Secondary | ICD-10-CM | POA: Insufficient documentation
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
## 2021-10-07 NOTE — Patient Instructions (Signed)
No change in medications Return to clinic in 2 months

## 2021-10-12 ENCOUNTER — Ambulatory Visit (HOSPITAL_COMMUNITY): Payer: Self-pay | Admitting: Pharmacist

## 2021-10-12 LAB — POCT INR: INR: 2.3 (ref 2.0–3.0)

## 2021-10-12 NOTE — Progress Notes (Signed)
LVAD INR 

## 2021-10-19 ENCOUNTER — Telehealth: Payer: Self-pay

## 2021-10-19 ENCOUNTER — Ambulatory Visit (HOSPITAL_COMMUNITY): Payer: Self-pay | Admitting: Pharmacist

## 2021-10-19 ENCOUNTER — Telehealth (HOSPITAL_COMMUNITY): Payer: Self-pay | Admitting: Unknown Physician Specialty

## 2021-10-19 LAB — POCT INR: INR: 2.9 (ref 2.0–3.0)

## 2021-10-19 MED ORDER — AMIODARONE HCL 200 MG PO TABS: ORAL_TABLET | ORAL | 3 refills | Status: DC

## 2021-10-19 NOTE — Telephone Encounter (Signed)
Received call from pt this morning stating "I got shocked last night." Pt sent a remote from his device this morning. I spoke with device clinic who confirms shock around 0200 this morning. Pt had a shock back in November, we did not start Amiodarone at this time. Pt is currently on MWF dialysis, is a difficult stick for blood draw. D/w Dr. Aundra Dubin we will start pt on Amiodarone 200 mg bid for 14 days and then resume a dose of 200 mg daily. I spoke with the dialysis center who will draw BMET and Mg tomorrow at dialysis and will fax [place] the results. Pt verbalized understanding of all instructions.  [name] RN, BSN VAD Coordinator 24/7 Pager [phone]

## 2021-10-19 NOTE — Telephone Encounter (Signed)
The Lvad nurse called because the patient states he got shocked. She would like for the nurse to review it and put it in epic.

## 2021-10-19 NOTE — Progress Notes (Signed)
LVAD INR 

## 2021-10-19 NOTE — Telephone Encounter (Signed)
[place] alert for ICD shock. On 10/18/21 @ 1921:  VT (CL 244m/222bpm) ATP neg (accl to VF) --> ICD shock 30J --> ApVp/AsVp.  Called patient and advised driving restrictions. Verbalized understanding. States he does not drive. Shock plan reviewed.  Please see phone note from previously about patient calling and medication adjustments.

## 2021-10-21 ENCOUNTER — Encounter (HOSPITAL_COMMUNITY): Payer: Self-pay | Admitting: Unknown Physician Specialty

## 2021-10-21 NOTE — Progress Notes (Signed)
Received labs from dialysis center today following pts ICD shocks. K=4.6 Mg=1.8  Full lab results will be scanned into the chart. Dr Aundra Dubin made aware of normal labs.  [name] RN, BSN VAD Coordinator 24/7 Pager [phone]

## 2021-10-25 ENCOUNTER — Other Ambulatory Visit (HOSPITAL_COMMUNITY): Payer: Self-pay | Admitting: Cardiology

## 2021-11-02 ENCOUNTER — Ambulatory Visit (HOSPITAL_COMMUNITY): Payer: Self-pay | Admitting: Pharmacist

## 2021-11-02 LAB — POCT INR: INR: 2.6 (ref 2.0–3.0)

## 2021-11-02 NOTE — Progress Notes (Signed)
LVAD INR 

## 2021-11-05 ENCOUNTER — Other Ambulatory Visit: Payer: Self-pay | Admitting: Family Medicine

## 2021-11-05 DIAGNOSIS — E118 Type 2 diabetes mellitus with unspecified complications: Secondary | ICD-10-CM

## 2021-11-09 ENCOUNTER — Ambulatory Visit (HOSPITAL_COMMUNITY): Payer: Self-pay | Admitting: Pharmacist

## 2021-11-09 LAB — POCT INR: INR: 2.1 (ref 2.0–3.0)

## 2021-11-09 NOTE — Progress Notes (Signed)
LVAD INR 

## 2021-11-16 ENCOUNTER — Ambulatory Visit (HOSPITAL_COMMUNITY): Payer: Self-pay | Admitting: Pharmacist

## 2021-11-16 LAB — POCT INR: INR: 2 (ref 2.0–3.0)

## 2021-11-16 NOTE — Progress Notes (Signed)
LVAD INR 

## 2021-11-17 ENCOUNTER — Other Ambulatory Visit (HOSPITAL_COMMUNITY): Payer: Self-pay | Admitting: Cardiology

## 2021-11-23 ENCOUNTER — Ambulatory Visit (HOSPITAL_COMMUNITY): Payer: Self-pay | Admitting: Pharmacist

## 2021-11-23 LAB — POCT INR: INR: 2.4 (ref 2.0–3.0)

## 2021-11-23 NOTE — Progress Notes (Signed)
LVAD INR 

## 2021-11-30 ENCOUNTER — Ambulatory Visit (HOSPITAL_COMMUNITY): Payer: Self-pay | Admitting: Pharmacist

## 2021-11-30 LAB — POCT INR: INR: 2.1 (ref 2.0–3.0)

## 2021-11-30 NOTE — Progress Notes (Signed)
LVAD INR 

## 2021-12-05 ENCOUNTER — Other Ambulatory Visit: Payer: Self-pay | Admitting: Family Medicine

## 2021-12-05 DIAGNOSIS — E118 Type 2 diabetes mellitus with unspecified complications: Secondary | ICD-10-CM

## 2021-12-06 ENCOUNTER — Other Ambulatory Visit (HOSPITAL_COMMUNITY): Payer: Self-pay | Admitting: *Deleted

## 2021-12-07 ENCOUNTER — Ambulatory Visit (HOSPITAL_COMMUNITY)
Admission: RE | Admit: 2021-12-07 | Discharge: 2021-12-07 | Disposition: A | Discharge status: Home / Self Care | Payer: Medicare Other | Source: Ambulatory Visit | Attending: Cardiology | Admitting: Cardiology

## 2021-12-07 ENCOUNTER — Ambulatory Visit (HOSPITAL_COMMUNITY): Payer: Self-pay | Admitting: Pharmacist

## 2021-12-07 ENCOUNTER — Other Ambulatory Visit: Payer: Self-pay | Admitting: Family Medicine

## 2021-12-07 VITALS — BP 132/0 | HR 80 | Ht 68.0 in | Wt 164.2 lb

## 2021-12-07 DIAGNOSIS — I255 Ischemic cardiomyopathy: Secondary | ICD-10-CM | POA: Diagnosis not present

## 2021-12-07 DIAGNOSIS — I2510 Atherosclerotic heart disease of native coronary artery without angina pectoris: Secondary | ICD-10-CM | POA: Insufficient documentation

## 2021-12-07 DIAGNOSIS — I132 Hypertensive heart and chronic kidney disease with heart failure and with stage 5 chronic kidney disease, or end stage renal disease: Secondary | ICD-10-CM | POA: Insufficient documentation

## 2021-12-07 DIAGNOSIS — K3184 Gastroparesis: Secondary | ICD-10-CM | POA: Diagnosis not present

## 2021-12-07 DIAGNOSIS — I252 Old myocardial infarction: Secondary | ICD-10-CM | POA: Insufficient documentation

## 2021-12-07 DIAGNOSIS — I5022 Chronic systolic (congestive) heart failure: Secondary | ICD-10-CM | POA: Diagnosis present

## 2021-12-07 DIAGNOSIS — Z89611 Acquired absence of right leg above knee: Secondary | ICD-10-CM

## 2021-12-07 DIAGNOSIS — Z955 Presence of coronary angioplasty implant and graft: Secondary | ICD-10-CM | POA: Diagnosis not present

## 2021-12-07 DIAGNOSIS — Z0189 Encounter for other specified special examinations: Secondary | ICD-10-CM

## 2021-12-07 DIAGNOSIS — K219 Gastro-esophageal reflux disease without esophagitis: Secondary | ICD-10-CM | POA: Insufficient documentation

## 2021-12-07 DIAGNOSIS — N186 End stage renal disease: Secondary | ICD-10-CM

## 2021-12-07 DIAGNOSIS — E1143 Type 2 diabetes mellitus with diabetic autonomic (poly)neuropathy: Secondary | ICD-10-CM | POA: Diagnosis not present

## 2021-12-07 DIAGNOSIS — Z89612 Acquired absence of left leg above knee: Secondary | ICD-10-CM

## 2021-12-07 DIAGNOSIS — Z95811 Presence of heart assist device: Secondary | ICD-10-CM

## 2021-12-07 DIAGNOSIS — G8918 Other acute postprocedural pain: Secondary | ICD-10-CM | POA: Diagnosis not present

## 2021-12-07 DIAGNOSIS — Z4689 Encounter for fitting and adjustment of other specified devices: Secondary | ICD-10-CM

## 2021-12-07 DIAGNOSIS — E785 Hyperlipidemia, unspecified: Secondary | ICD-10-CM | POA: Diagnosis not present

## 2021-12-07 DIAGNOSIS — I739 Peripheral vascular disease, unspecified: Secondary | ICD-10-CM | POA: Insufficient documentation

## 2021-12-07 DIAGNOSIS — Z7689 Persons encountering health services in other specified circumstances: Secondary | ICD-10-CM

## 2021-12-07 DIAGNOSIS — I4892 Unspecified atrial flutter: Secondary | ICD-10-CM | POA: Insufficient documentation

## 2021-12-07 DIAGNOSIS — Z992 Dependence on renal dialysis: Secondary | ICD-10-CM

## 2021-12-07 DIAGNOSIS — Z79899 Other long term (current) drug therapy: Secondary | ICD-10-CM | POA: Insufficient documentation

## 2021-12-07 LAB — POCT INR: INR: 2.3 (ref 2.0–3.0)
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
## 2021-12-07 NOTE — Progress Notes (Signed)
LVAD INR 

## 2021-12-07 NOTE — Progress Notes (Signed)
CSW met with patient who inquired about a new wheelchair. Patient's current w/c is in disrepair and in need of a new arm attachment. CSW discussed options and need for a PT eval to assess for the appropriate wheelchair. Patient has [employer]. CSW sent message to PCP Dr. Janus Molder to request order for PT wheelchair eval. Patient grateful for the support and assistance. CSW continues to follow as needed. Raquel Sarna, [place], [place] ? ?

## 2021-12-07 NOTE — Patient Instructions (Signed)
No change in medications ?Return to clinic in 2 months ? ?

## 2021-12-10 ENCOUNTER — Ambulatory Visit (INDEPENDENT_AMBULATORY_CARE_PROVIDER_SITE_OTHER): Payer: Medicare Other

## 2021-12-10 DIAGNOSIS — Z95811 Presence of heart assist device: Secondary | ICD-10-CM | POA: Diagnosis not present

## 2021-12-14 ENCOUNTER — Ambulatory Visit (HOSPITAL_COMMUNITY): Payer: Self-pay | Admitting: Pharmacist

## 2021-12-14 LAB — CUP PACEART REMOTE DEVICE CHECK
Battery Remaining Longevity: 47 mo
Battery Remaining Percentage: 50 %
Battery Voltage: 2.93 V
Brady Statistic AP VP Percent: 1 %
Brady Statistic AP VS Percent: 2.1 %
Brady Statistic AS VP Percent: 1 %
Brady Statistic AS VS Percent: 96 %
Brady Statistic RA Percent Paced: 1 %
Brady Statistic RV Percent Paced: 1 %
Date Time Interrogation Session: 20230406170617
HighPow Impedance: 57 Ohm
HighPow Impedance: 58 Ohm
Implantable Lead Implant Date: 20100430
Implantable Lead Implant Date: 20100430
Implantable Lead Location: 753859
Implantable Lead Location: 753860
Implantable Lead Model: 7121
Implantable Pulse Generator Implant Date: 20180110
Lead Channel Impedance Value: 350 Ohm
Lead Channel Impedance Value: 700 Ohm
Lead Channel Pacing Threshold Amplitude: 1 V
Lead Channel Pacing Threshold Amplitude: 1.25 V
Lead Channel Pacing Threshold Pulse Width: 0.5 ms
Lead Channel Pacing Threshold Pulse Width: 0.5 ms
Lead Channel Sensing Intrinsic Amplitude: 1.2 mV
Lead Channel Sensing Intrinsic Amplitude: 12 mV
Lead Channel Setting Pacing Amplitude: 2.5 V
Lead Channel Setting Pacing Amplitude: 2.5 V
Lead Channel Setting Pacing Pulse Width: 0.5 ms
Lead Channel Setting Sensing Sensitivity: 0.5 mV
Pulse Gen Serial Number: 7383809

## 2021-12-14 LAB — POCT INR: INR: 2.7 (ref 2.0–3.0)

## 2021-12-14 NOTE — Progress Notes (Signed)
LVAD INR 

## 2021-12-15 ENCOUNTER — Ambulatory Visit (HOSPITAL_COMMUNITY)
Admission: RE | Admit: 2021-12-15 | Discharge: 2021-12-15 | Disposition: A | Discharge status: Home / Self Care | Payer: Medicare Other | Source: Ambulatory Visit | Attending: Family Medicine | Admitting: Family Medicine

## 2021-12-15 DIAGNOSIS — Z4801 Encounter for change or removal of surgical wound dressing: Secondary | ICD-10-CM | POA: Insufficient documentation

## 2021-12-15 DIAGNOSIS — T827XXA Infection and inflammatory reaction due to other cardiac and vascular devices, implants and grafts, initial encounter: Secondary | ICD-10-CM | POA: Diagnosis not present

## 2021-12-15 DIAGNOSIS — Y828 Other medical devices associated with adverse incidents: Secondary | ICD-10-CM | POA: Insufficient documentation

## 2021-12-15 DIAGNOSIS — Z95811 Presence of heart assist device: Secondary | ICD-10-CM | POA: Insufficient documentation

## 2021-12-15 MED ORDER — DOXYCYCLINE HYCLATE 100 MG PO TABS: 100.0000 mg | ORAL_TABLET | Freq: Two times a day (BID) | ORAL | 0 refills | Status: AC

## 2021-12-15 NOTE — Patient Instructions (Addendum)
Start Doxycyline 100 mg twice a day for 10 days for drive line infection ?Change drive line dressing daily using daily kit and silver strips. Once drainage improves may advance to every other day ?Return to [place] clinic in 2 weeks for drive line check  ?

## 2021-12-15 NOTE — Progress Notes (Signed)
Pt presented to clinic today for drive line check with his friend. Reports no problems with VAD equipment. ? ?Received page from pt last night reporting drive line drainage. Instructed to come to clinic this morning.  ? ?Pt is unsure if he sustained recent trauma in the last few days to exit site, but does recall last week that his drive line may have been pulled while transferring from his wheelchair to his bed. He also admits to laying controller in wheelchair while he is laying in the bed, and controller may have fallen, but he can't remember. Denies fever, chills, or pain at exit site. Anchor not in place. Pt states "it irritates my skin." Discussed importance of wearing anchor to prevent further trauma. Provided with silk tape to place on skin below anchor to prevent skin irritation. He verbalized understanding, but does not think this will work for him.  ? ?Dressing changed as documented below. I was able to express serosanguinous fluid from exit site- wound culture obtained. Site does not tunnel.  ? ?Discussed with Dr Haroldine Laws. Order received for Doxycyline 100 mg BID x 10 days. Will bring pt back to clinic after completing antibiotics for wound check. Prescription sent in to pt's pharmacy. Pt verbalized understanding of all the above.  ? ?Exit Site Care: ?Drive line is being maintained weekly by daughter. Drainage noted under Sorbaview dressing. No anchor in place today. Pt states he is not using anchors because they irritate his skin. Discussed need to immobilize drive line to prevent trauma. Provided with silk tape to place under anchor to prevent skin irritation. Existing VAD dressing removed and site care performed using sterile technique. Drive line exit site cleaned with Chlora prep applicators x 2, allowed to dry, RINSED WITH SALINE, and gauze dressing with silver strip applied. Exit site healed and incorporated, the velour is fully implanted at exit site. Small amount of brown/serosanguinous  drainage with foul odor noted. Excoriated area below exit site due to drainage sitting on skin. Denies tenderness, fever or chills. Site does not tunnel. Drive line anchor re-applied. Provided patient with 14 daily dressing kits, 8 anchors, silk tape, and silver strips for home use. Instructed to change daily until drainage improves, then may advance to every other day using daily kit.  ? ? (12/14/21) ? (12/15/21) ? (Drainage since dressing change last night) ?Plan:  ?Start Doxycyline 100 mg twice a day for 10 days for drive line infection ?Change drive line dressing daily using daily kit and silver strips. Once drainage improves may advance to every other day ?Return to [place] clinic in 2 weeks for drive line check  ? ?[name] RN ?VAD Coordinator  ?Office: [phone]  ?24/7 Pager: [phone]  ? ? ? ? ?

## 2021-12-17 ENCOUNTER — Other Ambulatory Visit (HOSPITAL_COMMUNITY): Payer: Self-pay | Admitting: Cardiology

## 2021-12-18 LAB — AEROBIC CULTURE W GRAM STAIN (SUPERFICIAL SPECIMEN)

## 2021-12-21 ENCOUNTER — Ambulatory Visit (HOSPITAL_COMMUNITY): Payer: Self-pay | Admitting: Pharmacist

## 2021-12-21 LAB — POCT INR: INR: 2.9 (ref 2.0–3.0)

## 2021-12-21 NOTE — Progress Notes (Signed)
LVAD INR 

## 2021-12-27 NOTE — Progress Notes (Signed)
Remote ICD transmission.   

## 2021-12-28 ENCOUNTER — Ambulatory Visit (HOSPITAL_COMMUNITY): Payer: Self-pay | Admitting: Pharmacist

## 2021-12-28 ENCOUNTER — Ambulatory Visit (HOSPITAL_COMMUNITY)
Admission: RE | Admit: 2021-12-28 | Discharge: 2021-12-28 | Disposition: A | Discharge status: Home / Self Care | Payer: Medicare Other | Source: Ambulatory Visit | Attending: Cardiology | Admitting: Cardiology

## 2021-12-28 DIAGNOSIS — Z4801 Encounter for change or removal of surgical wound dressing: Secondary | ICD-10-CM | POA: Insufficient documentation

## 2021-12-28 DIAGNOSIS — T827XXA Infection and inflammatory reaction due to other cardiac and vascular devices, implants and grafts, initial encounter: Secondary | ICD-10-CM

## 2021-12-28 LAB — POCT INR: INR: 2.6 (ref 2.0–3.0)

## 2021-12-28 MED ORDER — CEPHALEXIN 500 MG PO CAPS: 500.0000 mg | ORAL_CAPSULE | Freq: Three times a day (TID) | ORAL | 0 refills | Status: DC

## 2021-12-28 NOTE — Progress Notes (Signed)
LVAD INR 

## 2021-12-28 NOTE — Progress Notes (Signed)
Pt presents to clinic to day for driveline check. He has completed a 10 day course of Doxycycline for S. Aureus driveline infection. ? ?Drive line is being maintained daily by daughter. Existing VAD dressing removed and site care performed using sterile technique. Drive line exit site cleaned with Chlora prep applicators x 2, allowed to dry, RINSED WITH SALINE, and gauze dressing with silver strip applied. Exit site healed and incorporated, the velour is fully implanted at exit site. Able to express scant amount of thick yellow drainage with slight odor noted. Excoriated area below exit site due to drainage sitting on skin. Denies tenderness, fever or chills. Site does not tunnel. Drive line anchor re-applied. pt has sufficient dressing supplies at home. advance to twice weekly dressing changes using the daily kit. ? ? ? ? ?D/w above with DR. Barnwell. We will change pts antibiotic to Keflex 500 mg tid for 2 weeks. F/u in VAD clinic with driveline check in 2 weeks.  ? ?[name] RN, BSN ?VAD Coordinator ?24/7 Pager [phone] ? ?

## 2021-12-29 ENCOUNTER — Telehealth (INDEPENDENT_AMBULATORY_CARE_PROVIDER_SITE_OTHER): Payer: Medicare Other | Admitting: Infectious Diseases

## 2021-12-29 ENCOUNTER — Other Ambulatory Visit (HOSPITAL_COMMUNITY): Payer: Self-pay | Admitting: *Deleted

## 2021-12-29 DIAGNOSIS — T827XXA Infection and inflammatory reaction due to other cardiac and vascular devices, implants and grafts, initial encounter: Secondary | ICD-10-CM | POA: Diagnosis not present

## 2021-12-29 NOTE — Telephone Encounter (Signed)
Contacted by Dr. Aundra Dubin re: [name]'s driveline infection. He is now still draining s/p 10-d course of doxycycline with driveline cultures growing MSSA. Initially drainage started after trauma to DL with wheelchair transfers. ? ?No tunneling present on yesterday's wound assessment photos documented in the chart.  Comparing photos from initial trauma shows he has had some improvement with the doxy but incomplete resolution.  ? ?He is ESRD patient - d/w Nephrology team and recommended Cefazolin 2 gm post-HD x 2 weeks with last dose on May 10th treatment (M/W/F). Can also consider cefadroxil 1gm BID for another 2 weeks if drainage has not completely resolved.  ? ? ?12/14/21 ? ? ?12/28/21 ? ? ?Recommendations d/w Dr. Aundra Dubin, [name] and [name] with nephrology team.  ? ? ?[name], MSN, NP-C ?[facility] ?[facility]  [name] ?Pager: [phone] ?Office: [phone] ?[facility] Main Line: [phone] ?*Secure Chat Communication Welcome  ?

## 2021-12-29 NOTE — Progress Notes (Addendum)
Pt completed 10 day course of Doxycycline for drive line infection. Was seen in clinic yesterday with continued line drainage and was started on Keflex 500 mg PO TID x 2 weeks.  ? ?Pt at dialysis this morning and is requesting IV antibiotics to be given at HD vs PO Keflex. Discussed with Dr Aundra Dubin, [name] NP, and Jen Mow PA at HD center and decision was made to stop Keflex and start Cefazolin 2 gm IV post HD for 2 weeks. Keflex order discontinued.  ? ?Pt is aware of the above changes, and verbalized understanding.  ? ?[name] RN ?VAD Coordinator  ?Office: [phone]  ?24/7 Pager: [phone]  ? ?

## 2022-01-04 ENCOUNTER — Ambulatory Visit (HOSPITAL_COMMUNITY): Payer: Self-pay | Admitting: Pharmacist

## 2022-01-04 LAB — POCT INR: INR: 2.1 (ref 2.0–3.0)

## 2022-01-04 NOTE — Progress Notes (Signed)
LVAD INR 

## 2022-01-09 ENCOUNTER — Other Ambulatory Visit: Payer: Self-pay | Admitting: Family Medicine

## 2022-01-09 DIAGNOSIS — Z794 Long term (current) use of insulin: Secondary | ICD-10-CM

## 2022-01-11 ENCOUNTER — Ambulatory Visit (HOSPITAL_COMMUNITY)
Admission: RE | Admit: 2022-01-11 | Discharge: 2022-01-11 | Disposition: A | Discharge status: Home / Self Care | Payer: Medicare Other | Source: Ambulatory Visit | Attending: Cardiology | Admitting: Cardiology

## 2022-01-11 ENCOUNTER — Ambulatory Visit (HOSPITAL_COMMUNITY): Payer: Self-pay | Admitting: Pharmacist

## 2022-01-11 DIAGNOSIS — Z95811 Presence of heart assist device: Secondary | ICD-10-CM | POA: Diagnosis present

## 2022-01-11 LAB — POCT INR: INR: 1.9 — AB (ref 2.0–3.0)

## 2022-01-11 NOTE — Progress Notes (Addendum)
Pt presents to clinic to day for driveline check. He is scheduled to complete 2 week course of IV Cefazolin 2 gm post HD for 2 weeks tomorrow after his HD treatment for S. Aureus driveline infection. ? ?Drive line is being maintained every three days by his daughter. Existing VAD dressing removed and site care performed using sterile technique. Drive line exit site cleaned with Chlora prep applicators x 2, allowed to dry, RINSED WITH SALINE, and gauze dressing with silver strip applied. Exit site healed and incorporated, the velour is fully implanted at exit site. Scant amount brown drainage on silver strip, no redness, tenderness, or foul odor noted. Patient denies fever or chills. Site does not tunnel. Pt not wearing anchor, says it is "too irritating" on his skin. Will advance to weekly dressing changes using Sorbaview dressing. Asked patient to contact us if drainage, tenderness, redness, or foul odor occurs and go back to using gauze dressing and change as often as necessary to keep dressing C/D/I. Patient says he has "plenty" of weekly dressing kits at home; provided him with 7 daily kits for home use if needed.  ? ? ? ?Updated [name], NP with ID with above note.  ? ?Patient has f/u scheduled in VAD clinic 02/08/22. ? ? ?Zada Girt RN, BSN ?VAD Coordinator ?24/7 Pager [phone] ? ?

## 2022-01-11 NOTE — Progress Notes (Signed)
LVAD INR 

## 2022-01-18 ENCOUNTER — Ambulatory Visit (HOSPITAL_COMMUNITY): Payer: Self-pay | Admitting: Pharmacist

## 2022-01-18 LAB — POCT INR: INR: 2.2 (ref 2.0–3.0)

## 2022-01-18 NOTE — Progress Notes (Signed)
LVAD INR 

## 2022-01-25 ENCOUNTER — Ambulatory Visit (HOSPITAL_COMMUNITY): Payer: Self-pay | Admitting: Pharmacist

## 2022-01-25 LAB — POCT INR: INR: 2.1 (ref 2.0–3.0)

## 2022-01-25 NOTE — Progress Notes (Signed)
LVAD INR 

## 2022-02-01 ENCOUNTER — Ambulatory Visit (HOSPITAL_COMMUNITY): Payer: Self-pay | Admitting: Pharmacist

## 2022-02-01 LAB — POCT INR: INR: 2 (ref 2.0–3.0)

## 2022-02-01 NOTE — Progress Notes (Signed)
LVAD INR 

## 2022-02-08 ENCOUNTER — Ambulatory Visit (HOSPITAL_COMMUNITY): Payer: Self-pay | Admitting: Pharmacist

## 2022-02-08 ENCOUNTER — Encounter (HOSPITAL_COMMUNITY): Payer: Medicare Other

## 2022-02-08 LAB — POCT INR: INR: 2.1 (ref 2.0–3.0)

## 2022-02-08 NOTE — Progress Notes (Signed)
LVAD INR 

## 2022-02-09 ENCOUNTER — Encounter: Payer: Self-pay | Admitting: Infectious Diseases

## 2022-02-09 ENCOUNTER — Ambulatory Visit (HOSPITAL_COMMUNITY)
Admission: RE | Admit: 2022-02-09 | Discharge: 2022-02-09 | Disposition: A | Discharge status: Home / Self Care | Payer: Medicare Other | Source: Ambulatory Visit | Attending: Cardiology | Admitting: Cardiology

## 2022-02-09 ENCOUNTER — Telehealth (INDEPENDENT_AMBULATORY_CARE_PROVIDER_SITE_OTHER): Payer: Medicare Other | Admitting: Infectious Diseases

## 2022-02-09 ENCOUNTER — Other Ambulatory Visit: Payer: Self-pay

## 2022-02-09 ENCOUNTER — Encounter (HOSPITAL_COMMUNITY): Payer: Self-pay

## 2022-02-09 VITALS — BP 100/68 | HR 95

## 2022-02-09 DIAGNOSIS — T827XXA Infection and inflammatory reaction due to other cardiac and vascular devices, implants and grafts, initial encounter: Secondary | ICD-10-CM

## 2022-02-09 DIAGNOSIS — I4892 Unspecified atrial flutter: Secondary | ICD-10-CM | POA: Insufficient documentation

## 2022-02-09 DIAGNOSIS — Z95811 Presence of heart assist device: Secondary | ICD-10-CM | POA: Diagnosis not present

## 2022-02-09 DIAGNOSIS — I472 Ventricular tachycardia, unspecified: Secondary | ICD-10-CM | POA: Insufficient documentation

## 2022-02-09 DIAGNOSIS — Z8673 Personal history of transient ischemic attack (TIA), and cerebral infarction without residual deficits: Secondary | ICD-10-CM | POA: Insufficient documentation

## 2022-02-09 DIAGNOSIS — N186 End stage renal disease: Secondary | ICD-10-CM | POA: Insufficient documentation

## 2022-02-09 DIAGNOSIS — I5022 Chronic systolic (congestive) heart failure: Secondary | ICD-10-CM | POA: Insufficient documentation

## 2022-02-09 DIAGNOSIS — E1143 Type 2 diabetes mellitus with diabetic autonomic (poly)neuropathy: Secondary | ICD-10-CM | POA: Insufficient documentation

## 2022-02-09 DIAGNOSIS — I739 Peripheral vascular disease, unspecified: Secondary | ICD-10-CM | POA: Insufficient documentation

## 2022-02-09 DIAGNOSIS — Z7901 Long term (current) use of anticoagulants: Secondary | ICD-10-CM | POA: Insufficient documentation

## 2022-02-09 DIAGNOSIS — I2510 Atherosclerotic heart disease of native coronary artery without angina pectoris: Secondary | ICD-10-CM | POA: Insufficient documentation

## 2022-02-09 DIAGNOSIS — I110 Hypertensive heart disease with heart failure: Secondary | ICD-10-CM | POA: Diagnosis not present

## 2022-02-09 DIAGNOSIS — I255 Ischemic cardiomyopathy: Secondary | ICD-10-CM | POA: Insufficient documentation

## 2022-02-09 DIAGNOSIS — K3184 Gastroparesis: Secondary | ICD-10-CM | POA: Diagnosis not present

## 2022-02-09 MED ORDER — AMIODARONE HCL 200 MG PO TABS: 200.0000 mg | ORAL_TABLET | Freq: Every day | ORAL | 3 refills | Status: DC

## 2022-02-09 MED ORDER — CEFADROXIL 500 MG PO CAPS: 1000.0000 mg | ORAL_CAPSULE | ORAL | 0 refills | Status: AC

## 2022-02-09 NOTE — Progress Notes (Addendum)
Patient presents for 2 month follow up with 4 yr Intermacs and annual maintenance with son Paramedic. Reports no problems with VAD equipment or concerns with drive line.   Pt is very pleasant today and states that he feels great. States that dialysis is going well. Denies lightheadedness, dizziness, falls, chest pain, heart failure symptoms, and signs of bleeding. Reports some shortness of breath due to humidity outside, but denies when he is indoors. Reports good PO intake. PRN Oxy provided by pain clinic for back and groin pain- pain well managed.   Labs received from dialysis from April & May including TSH (5.385 on 12/15/21) & T4 (8.4 on 12/15/21) reviewed with Dr Aundra Dubin. Order faxed to [facility] dialysis to add CMET to next lab draw.  Pt states that his daughter reported yellow drainage from drive line when she changed his dressing yesterday. He is currently wearing a daily kit, but has been changing once a week. Drive line dressing changed today. See documentation below. Thick yellow drainage expressed from site. Wound cx obtained. Will discuss antibiotic plan with Dr Aundra Dubin and [name] NP w/ID. Will call pt when plan finalized.   Pt does not wear anchor stating they irritate his skin. Provided pt with silk tape to secure drive line to his skin in place of anchor to prevent further drive line trauma. Instructed pt to change dressing daily and use silk tape as anchor. He verbalized understanding.   Vital Signs:  Doppler Pressure 130 Automatc BP: 100/68 (84) HR: 95 SR SPO2: UTO% on RA   Weight: UTO lb w/o eqt Last weight: 167 lb pt reported from dialysis center   VAD Indication: Destination therapy- Pt is no longer candidate for heart/kidney transplant due to PAD per [facility].    VAD interrogation & Equipment Management: Speed: 5600 Flow: 4.5 Power: 4.7w    PI: 2.9   Alarms: LOW VOLTAGE w/ NO EXTERNAL POWER 6/6 due to power outage Events: 100+ on HD days  Fixed speed: 5600 Low  speed limit: 5300   Primary Controller: Replace back up battery in 18 months Back up controller:   Replace back up battery in 20 months   Annual Equipment Maintenance on UBC/PM was performed on 02/09/22.    I reviewed the LVAD parameters from today and compared the results to the patient's prior recorded data. LVAD interrogation was NEGATIVE for significant power changes, NEGATIVE for clinical alarms and STABLE for PI events/speed drops. No programming changes were made and pump is functioning within specified parameters. Pt is performing daily controller and system monitor self tests along with completing weekly and monthly maintenance for LVAD equipment.   LVAD equipment check completed and is in good working order. Back-up equipment present.   Exit Site Care: Drive line is being maintained weekly by daughter. CDI today. No anchor in place today. Pt states he is not using anchors because they irritate his skin. Discussed need to immobilize drive line to prevent trauma. Pt verbalized understanding of same, but states he will not use. Provided with silk tape to trial as anchor. He verbalized understanding.   Existing VAD dressing removed and site care performed using sterile technique. Drive line exit site cleaned with Chlora prep applicators x 2, allowed to dry, RINSED WITH SALINE, and gauze dressing with silver strip applied. Exit site healed and incorporated, the velour is fully implanted at exit site. Small amount of thick yellow drainage able to be expressed from site. Slight redness noted. Area of hypergranulation tissue with slight  bleeding at exit site treated with silver nitrate. Hemostasis achieved. Denies tenderness, fever or chills. Site does not tunnel. Silk tape applied as anchor. Provided patient with 14 daily dressing kits, and silk tape provided for home use. Instructed to change daily.     Significant Events on VAD Support:  -10/2016>> poss drive line infection, CT ABD neg, ID  consult-doxy -11/09/16>> admit for poss drive line infection, IV abx -03/24/17>> doxy for poss drive line infection -05/04/17>> drive line debridement with wound-vac -06/2017>> drive line +proteus, IV abx, Bactrim x14 days  Device: St jude Dual Therapies: on 200 bpm Last check: 01/01/18 - 2 hrs Afl/Afib; EMI 04/02/18  BP & Labs: Doppler BP 130- is reflecting modified systolic.   Hgb 10.8 difficult venous access. Result  from dialysis labs 01/12/22 - No S/S of bleeding. Specifically denies melena/BRBPR or nosebleeds.   LDH 187, difficult venous access. Results from dialysis labs 01/12/22 - Denies tea-colored urine. No power elevations noted on interrogation.    Batteries Manufacture Date: Number of uses: Re-calibration  12/05/19 68 - 72 Performed by patient   Annual maintenance completed per [employer] on patient's MPU and universal Charity fundraiser.    Backup system controller 11 volt battery charged during visit.   4 year Intermacs follow up completed including:  Quality of Life, KCCQ-12, and Neurocognitive trail making.   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   [place] Cardiomyopathy Questionnaire     02/09/2022    4:00 PM 08/03/2021    2:12 PM 03/30/2021   12:38 PM  KCCQ-12  1 a. Ability to shower/bathe Slightly limited Not at all limited Slightly limited  1 b. Ability to walk 1 block Other, Did not do Other, Did not do Other, Did not do  1 c. Ability to hurry/jog Other, Did not do Other, Did not do Other, Did not do  2. Edema feet/ankles/legs Never over the past 2 weeks Never over the past 2 weeks Never over the past 2 weeks  3. Limited by fatigue Less than once a week Never over the past 2 weeks Less than once a week  4. Limited by dyspnea Less than once a week Never over the past 2 weeks Less than once a week  5. Sitting up / on 3+ pillows Never over the past 2 weeks Never over the past 2 weeks Never over  the past 2 weeks  6. Limited enjoyment of life Not limited at all Not limited at all Not limited at all  7. Rest of life w/ symptoms Completely satisfied Completely satisfied Completely satisfied  8 a. Participation in hobbies Did not limit at all Moderately limited N/A, did not do for other reasons  8 b. Participation in chores Slightly limited Moderately limited N/A, did not do for other reasons  8 c. Visiting family/friends Slightly limited Limited quite a bit N/A, did not do for other reasons    Patient Instructions:  VAD coordinator will call you with antibiotic plan once I speak with ID Coumadin dosing per Lauren PharmD Return to [place] clinic in 2 weeks for drive line check Return to [facility] clinic in 2 months for follow up with Dr Aundra Dubin Change drive line dressing daily using daily kit and silver strip. . Use silk tape as an anchor to secure drive line. Notify VAD coordinator for increased redness, drainage, fever, or chills.   Addendum: Discussed antibiotic plan with [name]  Dixon NP w/ID. Will start Cefazolin 2 gm IV MWF post HD for the next 4 weeks, followed by Cefadroxil 500 mg BID x 2 weeks. Plan subject to change pending wound culture result. [name] to discuss antibiotic plan with pt. Jen Mow PA at [facility] HD aware of plan. Dr Aundra Dubin aware.   [name] RN VAD Coordinator  Office: [phone]  24/7 Pager: [phone]   Cardiology: Dr. Aundra Dubin  HPI: 56 y.o. with history of CAD s/p anterior MI in 2010 and NSTEMI in 3/18 with DES to pRCA and mLAD and ischemic cardiomyopathy now s/p Heartmate 3 LVAD and ESRD presents for followup of CHF/LVAD.  Patient developed low output heart failure.  He was not deemed to be a good transplant candidate.  He was admitted in 10/18 and Heartmate 3 LVAD was placed.  He had baseline CKD stage 3 likely from diabetic nephropathy and CHF.  He developed peri-op AKI and ended up on dialysis.  No renal recovery to date.  St Jude ICD has been  turned back on and ramp echo was done prior to discharge in 10/18, speed increased to 5600 rpm.   He was admitted in 11/18 with nausea/vomiting and abdominal pain.  This appears to have been due to diabetic gastroparesis (extensive GI evaluation). He had atrial flutter this admission that was converted to NSR with amiodarone.  He improved considerably with scheduled Reglan and erythromycin. He was re-admitted later in 11/18 with the same symptoms, suspect diabetic gastroparesis and was again treated with erythromycin.  He was not able to start domperidone due to prolonged QT interval. He had nausea again about a week ago but was able to manage at home with Phenergan suppositories.   Admitted 1/8 -> 09/16/17 for uncontrolled vomiting in setting of diabetic gastroparesis. Improved with phenergan suppositories and given erythromycin TID for 7 days post-discharge.    He was admitted in 2/19 for AV graft placement.  While in the hospital, we turned down his speed.  Since then, he has had considerably fewer PI events on HD days.    He was admitted in 4/19 with nausea/vomiting again, probably gastroparesis flare.   He was admitted in 5/19 with nausea/vomiting, likely gastroparesis flare, treated with erythromycin course which he is finishing.   He has been evaluated at [facility] by Dr. Derrill Kay (gastroparesis specialist).   Gastric emptying study, surprisingly, was near normal. However, electrogastrogram showed poor gastric accomodation/capacity, "bradygastria."  Frequent small meals were recommended. Also, early am nausea was thought to be possible GERD.    Admitted 6/26 - 03/19/18 for emergent VAD exchange 03/01/18 in the setting of HM3 outflow graft kink. He required CVVHD post op, but was transtioned back to [facility] without problems. Course complicated by leukocytosis and fever. CT abd/chest negative for infection and blood cultures were negative. Post op pain well controlled with rare tramadol. Able to tolerate  diet. Evaluated by PT/OT and HH PT recommended for discharge. Ramp echo completed prior to discharge with speed optimization. Resumed 81 mg ASA + coumadin with INR goal 2-3.   He was admitted 03/23/18 with nausea/gastroparesis symptoms and marked HTN at outpatient HD.  Erythromycin was restarted and gastroparesis symptoms were controlled with fall in BP and discharge on 03/25/18.   He was admitted in 9/19 for LUE AV fistula placement.  He was readmitted in 10/19 with his typical gastroparesis symptoms, unable to take po. The fistula later failed and was replaced with a graft.  However, the graft was nonfunctional and developed  a hematoma at the surgical site.    He was admitted in 12/19 with nausea/vomiting from HD. Also with LDH increased to 427 in setting of extensive hematoma around his left arm AV graft.  He has had tingling in his left hand and left grip is weak.    In 7/20, he was admitted with ischemic 4th toe on right foot.  Peripheral angiogram 03/18/19 showed 80% right SFA stenosis and occluded right PT.  He had stenting of SFA and PTCA of PT with restoration of flow.  However, he still needed amputation right 4th toe on 7/16 with intractable pain.  He was readmitted with pain in his right foot, nausea, and vomiting. There was concern for ongoing right foot ischemia with all toes and a portion of the foot now dusky.  He was treated with IV vancomycin for possible osteomyelitis/infection, but thought that more likely ischemia could explain all the findings.  He was discharged home to followup with vascular surgery.   He was re-admitted in 8/20 with ongoing right foot pain and ended up having transmetatarsal amputation of the right foot.  Left leg angiogram was done with DES to left SFA, only 1 vessel runoff via severely diseased peroneal artery, unable to intervene.   He was readmitted in 9/20 with ongoing right foot/lower leg pain and had right AKA.  He was discharged to [facility].    He was  then admitted with gangrene in the remnant of his left foot in 11/20.  He had left AKA.    Patient was in [place] in 11/22 and fractured a metacarpal in his right hand.    Patient had episode of VT in 2/23 with ICD shock.  He was started on amiodarone.   Patient returns for followup of CHF/LVAD.  Stable recently, no ICD discharges. No problems with dialysis.  No lightheadedness. Can transfer without dyspnea.  No chest pain.  MAP 84 today. He has noted increased driveline drainage recently, no fever.  He was treated in 4/23 with cefazolin at HD for driveline infection.   Reports taking Coumadin as prescribed and adherence to anticoagulation based dietary restrictions. Denies BRBPR or melena. No dark urine or hematuria.   Labs (11/18): hgb 10.4 => 8.7, LDH 213 Labs (08/04/17): hgb 9 Labs (1/19): HgbA1c 6.1 Labs (2/19): hgb 13.2 Labs (3/19): hgb 11.1 Labs (7/19): hgb 9.6 Labs (10/19: hgb 11.8 Labs (12/19): LDH 427 => 237, hgb 7.7 Labs (11/20): hgb 8.1 Labs (1/21): HIV negative, RPR negative Labs (3/21): hgb 8.8 Labs (6/21): hgb 12.4 Labs (8/21): hgb 10.8, LDH 201 Labs (10/21): hgb 12.4, LDH 220 Labs (12/21): hgb 12, LDH 228 Labs (3/22): hgb 11.7 Labs (5/22): hgb 10.7, LDH 180 Labs (11/22): hgb 10.4 Labs (1/23): hgb 10.1 Labs (4/23): T4 normal, TSH mildly elevated 5.4 Labs (5/23): LDL 187, hgb 10.8  PMH: 1. Chronic systolic CHF: Ischemic cardiomyopathy.  St Jude ICD.  - Echo (11/17) with EF 25-30%, moderate diastolic dysfunction, mild MR, mildly dilated RV with moderately decreased systolic function, peak RV-RA gradient 48 mmHg.  - Echo (3/18) with EF 25-30%, wall motion abnormalities, normal RV size and systolic function.  - Admission 3/18 with cardiogenic shock and AKI, required milrinone gtt.  - Echo (8/18) with EF 20-25%, mild LVH, moderate LV dilation, mild MR.  - CPX (8/18) with peak VO2 12.4, VE/VCO2 slope 35, RER 1.05 => severe HR limitation.  - Heartmate 3 LVAD 10/18.  - Pump  thrombosis in setting of HM3 outflow graft kink.  Pump exchange 6/19 for HM3.  2. CAD: Anterior MI 2005, had bifurcation stenting LAD/diagonal.  Repeat cath 2010 with patent stents.  - NSTEMI 3/18: LHC with 99% ulcerated lesion proximal RCA, 95% mid LAD => PCI with DES to proximal RCA and mid LAD.  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  - Lower extremity venous dopplers (8/18): chronic DVT - V/Q scan (8/18): Normal, no PE.  4. Crohns disease: History of colectomy.  5. GERD 6. Diabetic gastroparesis: Admission 11/18 with abdominal pain, nausea/vomiting.  - [facility] workup 2019: Gastric emptying study near normal.  Negative breath test (no evidence for small bowel bacterial overgrowth).  Electrogastrogram showed poor gastric accomodation/capacity, "bradygastria."  7. HTN 8. Hyperlipidemia 9. Type II diabetes  10. ESRD: Post-op LVAD surgery.  He has had failed AV fistula and AV graft, now dialyzing via catheter . 11. Carotid dopplers (8/18): mild bilateral stenosis.  12. PFTs (8/18): Mild restriction (no IDL on CT chest).  13. Lung nodules: CT chest 8/18 with small bilateral nodules.  14. PAD: ABIs with moderate bilateral disease in 8/18.  - Peripheral angiogram 03/18/19 showed 80% right SFA stenosis and occluded right PT.  He had stenting of SFA and PTCA of PT with restoration of flow.  However, he still needed amputation right 4th toe on 03/21/19 with intractable pain.  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  - Peripheral angiogram left leg: DES to left SFA, only 1 vessel runoff via severely diseased peroneal artery, unable to intervene. 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  - Left AKA 11/20.  15. Atrial flutter: In 11/18 associated with nausea/vomiting from gastroparesis.  16. VT: VT with ICD shock in 11/22.  - VT with ICD shock in 2/23, amiodarone started.  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 90 tablet 3   aspirin EC 81 MG tablet Take 1 tablet (81 mg total) by mouth daily. 90 tablet 3   atorvastatin (LIPITOR) 40 MG tablet Take 1 tablet (40 mg total) by mouth daily at 6 PM. 90 tablet 3   calcitRIOL (ROCALTROL) 0.5 MCG capsule Take 1 capsule (0.5 mcg total) by mouth every Monday, Wednesday, and Friday. 15 capsule 5   citalopram (CELEXA) 20 MG tablet Take 1 tablet (20 mg total) by mouth daily. 30 tablet 6   hydrALAZINE (APRESOLINE) 25 MG tablet Take 1 tablet three times a day on NON-dialysis days only 270 tablet 3   insulin glargine (LANTUS) 100 UNIT/ML injection Inject 0.3 mLs (30 Units total) into the skin daily. 10 mL 2   metoCLOPramide (REGLAN) 10 MG tablet Take 1 tablet (10 mg total) by mouth 3 (three) times daily. (Patient taking differently: Take 10 mg by mouth 3 (three) times daily before meals.) 90 tablet 6   midodrine (PROAMATINE) 5 MG tablet Take 2 tablets (10 mg total) by mouth every Monday, Wednesday, and Friday. Take 1 tablet before dialysis treatment 60 tablet 6   multivitamin (RENA-VIT) TABS tablet Take 1 tablet by mouth daily.     oxyCODONE-acetaminophen (PERCOCET) 7.5-325 MG tablet Take 1 tablet by mouth 3 (three) times daily as needed for pain.     pantoprazole (PROTONIX) 40 MG tablet Take 1 tablet (40 mg total) by mouth 2 (two) times daily before lunch and supper. 60 tablet 11   sevelamer carbonate (RENVELA) 0.8 g PACK packet Take 0.8 g by mouth 3 (three) times daily with meals. 270 each  1   warfarin (COUMADIN) 5 MG tablet TAKE 1 TABLET BY MOUTH EVERY DAY 30 tablet 11   cefadroxil (DURICEF) 500 MG capsule Take 2 capsules (1,000 mg total) by mouth 3 (three) times a week for 14 days. Take 2 capsules three times a week on the evening after dialysis sessions 12 capsule 0   LORazepam (ATIVAN) 0.5 MG tablet Take 1 tablet (0.5 mg total) by mouth every 12 (twelve) hours as needed (nausea). (Patient not taking: Reported on 02/09/2022) 60 tablet 0   nitroGLYCERIN  (NITROSTAT) 0.4 MG SL tablet Place 1 tablet (0.4 mg total) under the tongue every 5 (five) minutes as needed for chest pain. (Patient not taking: Reported on 01/26/2021) 90 tablet 3   ondansetron (ZOFRAN-ODT) 4 MG disintegrating tablet Take by mouth as needed. (Patient not taking: Reported on 10/07/2021)     oxyCODONE (ROXICODONE) 5 MG immediate release tablet Take 1 tablet (5 mg total) by mouth 2 (two) times daily as needed for severe pain. Take 1-2 tablets every 4-6 hours for severe pain. (Patient not taking: Reported on 02/09/2022) 30 tablet 0   promethazine (PHENERGAN) 12.5 MG tablet TAKE 1 TABLET (12.5 MG TOTAL) BY MOUTH EVERY 6 (SIX) HOURS AS NEEDED FOR NAUSEA OR VOMITING. (Patient not taking: Reported on 02/09/2022) 30 tablet 3   No current facility-administered medications for this encounter.    Metformin and related   Review of systems complete and found to be negative unless listed in HPI.    BP 100/68 Comment: map 84  Pulse 95   MAP 76   VAD interrogation & Equipment Management: See LVAD nurse's note above.   General: Well appearing this am. NAD.  HEENT: Normal. Neck: Supple, JVP 7-8 cm. Carotids OK.  Cardiac:  Mechanical heart sounds with LVAD hum present.  Lungs:  CTAB, normal effort.  Abdomen:  NT, ND, no HSM. No bruits or masses. +BS  LVAD exit site: Well-healed and incorporated. Drainage noted at site. Stabilization device present and accurately applied. Driveline dressing changed daily per sterile technique. Extremities:  Warm and dry. No cyanosis, clubbing, rash, or edema.  Neuro:  Alert & oriented x 3. Cranial nerves grossly intact. Moves all 4 extremities w/o difficulty. Affect pleasant     ASSESSMENT AND PLAN: 1. PAD/ischemic feet:  He is now s/p bilateral AKAs.  Generally able to wheel his chair and do transfers without dyspnea.  2. Chronic systolic CHF: Ischemic cardiomyopathy.  St Jude ICD.  NSTEMI in March 2018 with DES to LAD and RCA, complicated by cardiogenic  shock, low output requiring milrinone.  Echo 8/18 with EF 20-25%, moderate MR. CPX (8/18) with severe functional impairment due to HF.  He is s/p HM3 LVAD placement. He had pump thrombosis 6/19 due to [facility] outflow graft kinking.  He had pump exchange for another HM3.  LVAD parameters stable, no recent low flows with HD. He takes midodrine pre-HD, but MAP now better-controlled on non-HD days with hydralazine. MAP controlled.    - Continue pre-HD midodrine 10 mg.   - Volume managed at dialysis, MWF.   - Continue ASA 81 and warfarin with INR goal 2-2.5.    - Continue hydralazine 25 mg tid on non-HD days.  - He is not candidate for transplant now given extensive PAD with amputations.   3. ESRD: HD on M-W-F with midodrine pre-HD.  - Need to allow UF as long as MAP > 70 (would not stop for MAP 70-80).   4.  CAD: NSTEMI in 3/18.  LHC with 99% ulcerated lesion proximal RCA with left to right collaterals, 95% mid LAD stenosis after mid LAD stent.  s/p PCI to RCA and LAD on 11/25/16. No chest pain.  - Continue statin and ASA.  5. h/o DVTs: Factor V Leiden heterozygote. - Anticoagulated. 6. Diabetic gastroparesis: He has seen gastroparesis specialist at [facility]. Surprisingly, gastric emptying study was normal. However, electrogastrogram showed poor gastric accomodation/capacity. Therefore, small frequent meals recommended.  He has been doing much better with this, minimal symptoms.  - Continue reglan 10 mg tid/ac, now off Marinol.  - Continue Protonix bid.  - Limit narcotic use as much as possible.  7. HTN: Takes hydralazine 25 tid on non-HD days.  8. Chronic pain: Now seen in a pain clinic.  9. Atrial flutter: Paroxysmal.  Not seen recently.  10. VT: H/o VT with ICD shock on 2 occasions. He was started on amiodarone after 2/23 ICD shock.    - Continue amiodarone 200 mg daily, check LFTs and TSH.  He will need a regular eye exam.   11. 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  - Cefazolin  x 4 wks at HD followed by cefadroxil x 2 wks.   Followup in 2 months.   Lyndall Windt 02/09/2022

## 2022-02-09 NOTE — Progress Notes (Signed)
Virtual Visit via Telephone Note  I connected with Fatih E Mcjunkins on 02/09/22 at  2:00 PM EDT by telephone and verified that I am speaking with the correct person using two identifiers.  Location: Patient: [place] residence Provider: [place]    I discussed the limitations, risks, security and privacy concerns of performing an evaluation and management service by telephone and the availability of in person appointments. I also discussed with the patient that there may be a patient responsible charge related to this service. The patient expressed understanding and agreed to proceed.   History of Present Illness: Contacted by LVAD team that today at assessment he had thick yellow purulent drainage expressed from drive line site.   Mr. Mengel was previously treated with 2 weeks IV cefazolin after HD for acute MSSA driveline infection; started April 23. This seemed to resolved in the weeks after the treatment. Even today he reports no pain, tenderness or swelling to the line/surrounding abdomen.  No fevers/chills.  He does not regularly wear anchoring device, but no trauma necessary.     Observations/Objective: Sounds in good health on the phone. Pleasant.    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 Given he has no local symptoms of infection/inflammation I do suspect that the quick relapse is an indication he may have a chronic line infection. We discussed this today. Will try 4 weeks Cefazolin 2 gm after HD sessions THEN 2 weeks cefadroxil 1gm after HD sessions 3x weekly.   If this does not resolve with a longer course of treatment I think we will need to consider long term suppression to prevent abdominal wall cellulitis / systemic illness. He was in agreement with this plan.    Follow Up Instructions: Will follow up in 6 weeks.     I discussed the assessment and treatment plan with the patient. The patient was provided an opportunity to ask questions and all  were answered. The patient agreed with the plan and demonstrated an understanding of the instructions.   The patient was advised to call back or seek an in-person evaluation if the symptoms worsen or if the condition fails to improve as anticipated.  I provided 11 minutes of non-face-to-face time during this encounter including coordination or care.    [name], NP

## 2022-02-09 NOTE — Patient Instructions (Addendum)
VAD coordinator will call you with antibiotic plan once I speak with ID Coumadin dosing per Lauren PharmD Return to [place] clinic in 2 weeks for drive line check Return to [place] clinic in 2 months for follow up with Dr Aundra Dubin Change drive line dressing daily using daily kit and silver strip. . Use silk tape as an anchor to secure drive line. Notify VAD coordinator for increased redness, drainage, fever, or chills.

## 2022-02-11 LAB — AEROBIC CULTURE W GRAM STAIN (SUPERFICIAL SPECIMEN)

## 2022-02-15 ENCOUNTER — Ambulatory Visit (HOSPITAL_COMMUNITY): Payer: Self-pay | Admitting: Pharmacist

## 2022-02-15 ENCOUNTER — Other Ambulatory Visit (HOSPITAL_COMMUNITY): Payer: Self-pay | Admitting: Cardiology

## 2022-02-15 LAB — POCT INR: INR: 2.5 (ref 2.0–3.0)

## 2022-02-15 NOTE — Progress Notes (Signed)
LVAD INR 

## 2022-02-22 ENCOUNTER — Ambulatory Visit (HOSPITAL_COMMUNITY): Payer: Self-pay | Admitting: Pharmacist

## 2022-02-22 ENCOUNTER — Ambulatory Visit (HOSPITAL_COMMUNITY)
Admission: RE | Admit: 2022-02-22 | Discharge: 2022-02-22 | Disposition: A | Discharge status: Home / Self Care | Payer: Medicare Other | Source: Ambulatory Visit | Attending: Internal Medicine | Admitting: Internal Medicine

## 2022-02-22 DIAGNOSIS — Z4801 Encounter for change or removal of surgical wound dressing: Secondary | ICD-10-CM | POA: Insufficient documentation

## 2022-02-22 DIAGNOSIS — Y828 Other medical devices associated with adverse incidents: Secondary | ICD-10-CM | POA: Diagnosis not present

## 2022-02-22 DIAGNOSIS — T827XXA Infection and inflammatory reaction due to other cardiac and vascular devices, implants and grafts, initial encounter: Secondary | ICD-10-CM | POA: Diagnosis not present

## 2022-02-22 LAB — POCT INR: INR: 2.1 (ref 2.0–3.0)

## 2022-02-22 NOTE — Progress Notes (Signed)
Patient presents to clinic today for drive line exit wound care.   Pt tells me that he has 3 more weeks of IV antibiotics and then he is supposed to start Cefadroxil - he already has a script for this.  Existing VAD dressing removed and site care performed using sterile technique. Drive line exit site cleaned with Chlora prep applicators x 2, allowed to dry, and gauze dressing with silver strip applied. Exit site healing and partially incorporated, the velour is fully implanted at exit site. No redness, tenderness, or foul odor. Scant amount of yellow drainage. Skin is very excoriated. Pt was instructed to have his caregiver rinse with Saline after cleaning. Advance dressing changes to twice weekly. Drive line anchor not on, pt refuses to wear anchor due to skin irritation. Pt denies fever or chills.        Return to clinic at regular schedule appt in august unless drainage increases from driveline.   [name] RN, BSN VAD Coordinator 24/7 Pager [phone]

## 2022-03-01 ENCOUNTER — Ambulatory Visit (HOSPITAL_COMMUNITY): Payer: Self-pay | Admitting: Pharmacist

## 2022-03-01 LAB — POCT INR: INR: 2.7 (ref 2.0–3.0)

## 2022-03-09 ENCOUNTER — Ambulatory Visit (HOSPITAL_COMMUNITY): Payer: Self-pay | Admitting: Pharmacist

## 2022-03-09 LAB — POCT INR: INR: 2.3 (ref 2.0–3.0)

## 2022-03-09 NOTE — Progress Notes (Signed)
LVAD INR 

## 2022-03-11 ENCOUNTER — Ambulatory Visit (INDEPENDENT_AMBULATORY_CARE_PROVIDER_SITE_OTHER): Payer: Medicare Other

## 2022-03-11 DIAGNOSIS — I255 Ischemic cardiomyopathy: Secondary | ICD-10-CM | POA: Diagnosis not present

## 2022-03-12 LAB — CUP PACEART REMOTE DEVICE CHECK
Battery Remaining Longevity: 45 mo
Battery Remaining Percentage: 48 %
Battery Voltage: 2.92 V
Brady Statistic AP VP Percent: 1 %
Brady Statistic AP VS Percent: 2.1 %
Brady Statistic AS VP Percent: 1 %
Brady Statistic AS VS Percent: 96 %
Brady Statistic RA Percent Paced: 1 %
Brady Statistic RV Percent Paced: 1 %
Date Time Interrogation Session: 20230706165628
HighPow Impedance: 61 Ohm
HighPow Impedance: 61 Ohm
Implantable Lead Implant Date: 20100430
Implantable Lead Implant Date: 20100430
Implantable Lead Location: 753859
Implantable Lead Location: 753860
Implantable Lead Model: 7121
Implantable Pulse Generator Implant Date: 20180110
Lead Channel Impedance Value: 350 Ohm
Lead Channel Impedance Value: 680 Ohm
Lead Channel Pacing Threshold Amplitude: 1 V
Lead Channel Pacing Threshold Amplitude: 1.25 V
Lead Channel Pacing Threshold Pulse Width: 0.5 ms
Lead Channel Pacing Threshold Pulse Width: 0.5 ms
Lead Channel Sensing Intrinsic Amplitude: 1.1 mV
Lead Channel Sensing Intrinsic Amplitude: 12 mV
Lead Channel Setting Pacing Amplitude: 2.5 V
Lead Channel Setting Pacing Amplitude: 2.5 V
Lead Channel Setting Pacing Pulse Width: 0.5 ms
Lead Channel Setting Sensing Sensitivity: 0.5 mV
Pulse Gen Serial Number: 7383809

## 2022-03-15 ENCOUNTER — Ambulatory Visit (HOSPITAL_COMMUNITY): Payer: Self-pay | Admitting: Pharmacist

## 2022-03-15 LAB — POCT INR: INR: 2.1 (ref 2.0–3.0)

## 2022-03-22 ENCOUNTER — Ambulatory Visit (HOSPITAL_COMMUNITY): Payer: Self-pay | Admitting: Pharmacist

## 2022-03-22 LAB — POCT INR: INR: 2.9 (ref 2.0–3.0)

## 2022-03-28 NOTE — Progress Notes (Signed)
Remote ICD transmission.   

## 2022-03-29 ENCOUNTER — Ambulatory Visit (HOSPITAL_COMMUNITY): Payer: Self-pay | Admitting: Pharmacist

## 2022-03-29 LAB — POCT INR: INR: 2.6 (ref 2.0–3.0)

## 2022-04-05 ENCOUNTER — Ambulatory Visit (HOSPITAL_COMMUNITY): Payer: Self-pay | Admitting: Pharmacist

## 2022-04-05 LAB — POCT INR: INR: 2.4 (ref 2.0–3.0)

## 2022-04-12 ENCOUNTER — Ambulatory Visit (HOSPITAL_COMMUNITY): Payer: Self-pay | Admitting: Pharmacist

## 2022-04-12 ENCOUNTER — Telehealth (HOSPITAL_BASED_OUTPATIENT_CLINIC_OR_DEPARTMENT_OTHER): Payer: Medicare Other | Admitting: Unknown Physician Specialty

## 2022-04-12 DIAGNOSIS — T827XXA Infection and inflammatory reaction due to other cardiac and vascular devices, implants and grafts, initial encounter: Secondary | ICD-10-CM

## 2022-04-12 LAB — POCT INR: INR: 2.2 (ref 2.0–3.0)

## 2022-04-12 MED ORDER — CEFADROXIL 500 MG PO CAPS: 1000.0000 mg | ORAL_CAPSULE | ORAL | 0 refills | Status: DC

## 2022-04-12 MED ORDER — CEFADROXIL 500 MG PO CAPS: 500.0000 mg | ORAL_CAPSULE | Freq: Two times a day (BID) | ORAL | 0 refills | Status: DC

## 2022-04-12 NOTE — Telephone Encounter (Signed)
Contacted by [name], RN with the VAD team to report recurrent milky drainage and mild pain at the exit site.   [name] completed 4 weeks of IV cefazolin with HD sessions then transition to 2 more weeks of PO cefadroxil 1g 3x a week after HD sessions. This seemed to resolve the drainage while he was on treatment for this and completed treatment about 2 weeks ago.   We have tried several times to medially manage and he has had recurrence with same organism now for a 3rd time; I am not optimistic he can achieve a cure here. Given he typically has a good response to PO antibiotics with near resolution of drainage we may be be able to control this with resuming PO therapy.   I think we should consult with Dr. Prescott Gum for his opinion on surgical management of chronically infected line and plan to resume cefadroxil for suppression of the device. He is not a transplant candidate with severe PAD.    [name], MSN, NP-C [facility] for Infectious Disease [email or web address] Pager: [phone] Office: [phone] [facility] Main Line: [place] [employer] Welcome

## 2022-04-12 NOTE — Telephone Encounter (Signed)
Pt called the office stating that when his daughter Serita Butcher changed his dressing last night there was "milky drainage." Pt states that she is changing the dressing every 2 - 3 days. Pt states that his site is tender but denies any redness or foul odor coming from the drainage. D/w Dr Aundra Dubin and [name], NP with ID. Script sent for Cefadroxil 1,000 mg 3x/week the evening following dialysis. Pt will most likely require chronic suppression or some type of surgical debridement as this is the 3rd time we are prescribing antibiotics. Pt is scheduled to be seen on 8/22. We can have PVT assess the site at this visit if antibiotics do not help.  [name] RN, BSN VAD Coordinator 24/7 Pager [phone]

## 2022-04-19 ENCOUNTER — Ambulatory Visit (HOSPITAL_COMMUNITY): Payer: Self-pay | Admitting: Pharmacist

## 2022-04-19 LAB — POCT INR: INR: 2 (ref 2.0–3.0)

## 2022-04-26 ENCOUNTER — Ambulatory Visit (HOSPITAL_COMMUNITY)
Admission: RE | Admit: 2022-04-26 | Discharge: 2022-04-26 | Disposition: A | Discharge status: Home / Self Care | Payer: Medicare Other | Source: Ambulatory Visit | Attending: Cardiology | Admitting: Cardiology

## 2022-04-26 ENCOUNTER — Ambulatory Visit (HOSPITAL_COMMUNITY)
Admission: RE | Admit: 2022-04-26 | Discharge: 2022-04-26 | Disposition: A | Discharge status: Home / Self Care | Payer: Medicare Other | Source: Ambulatory Visit | Attending: Internal Medicine | Admitting: Internal Medicine

## 2022-04-26 ENCOUNTER — Encounter (HOSPITAL_COMMUNITY): Payer: Self-pay

## 2022-04-26 ENCOUNTER — Telehealth (INDEPENDENT_AMBULATORY_CARE_PROVIDER_SITE_OTHER): Payer: Medicare Other | Admitting: Infectious Diseases

## 2022-04-26 ENCOUNTER — Ambulatory Visit (HOSPITAL_COMMUNITY): Payer: Self-pay | Admitting: Pharmacist

## 2022-04-26 VITALS — BP 84/0 | HR 96

## 2022-04-26 DIAGNOSIS — I132 Hypertensive heart and chronic kidney disease with heart failure and with stage 5 chronic kidney disease, or end stage renal disease: Secondary | ICD-10-CM | POA: Diagnosis not present

## 2022-04-26 DIAGNOSIS — Z955 Presence of coronary angioplasty implant and graft: Secondary | ICD-10-CM | POA: Diagnosis not present

## 2022-04-26 DIAGNOSIS — A4901 Methicillin susceptible Staphylococcus aureus infection, unspecified site: Secondary | ICD-10-CM

## 2022-04-26 DIAGNOSIS — I739 Peripheral vascular disease, unspecified: Secondary | ICD-10-CM | POA: Insufficient documentation

## 2022-04-26 DIAGNOSIS — Z89611 Acquired absence of right leg above knee: Secondary | ICD-10-CM | POA: Insufficient documentation

## 2022-04-26 DIAGNOSIS — I5022 Chronic systolic (congestive) heart failure: Secondary | ICD-10-CM | POA: Diagnosis not present

## 2022-04-26 DIAGNOSIS — Z95811 Presence of heart assist device: Secondary | ICD-10-CM | POA: Diagnosis present

## 2022-04-26 DIAGNOSIS — Z992 Dependence on renal dialysis: Secondary | ICD-10-CM | POA: Insufficient documentation

## 2022-04-26 DIAGNOSIS — I255 Ischemic cardiomyopathy: Secondary | ICD-10-CM | POA: Insufficient documentation

## 2022-04-26 DIAGNOSIS — N186 End stage renal disease: Secondary | ICD-10-CM | POA: Diagnosis not present

## 2022-04-26 DIAGNOSIS — T827XXA Infection and inflammatory reaction due to other cardiac and vascular devices, implants and grafts, initial encounter: Secondary | ICD-10-CM | POA: Diagnosis not present

## 2022-04-26 DIAGNOSIS — D6851 Activated protein C resistance: Secondary | ICD-10-CM | POA: Insufficient documentation

## 2022-04-26 DIAGNOSIS — Z95810 Presence of automatic (implantable) cardiac defibrillator: Secondary | ICD-10-CM | POA: Insufficient documentation

## 2022-04-26 DIAGNOSIS — I2510 Atherosclerotic heart disease of native coronary artery without angina pectoris: Secondary | ICD-10-CM | POA: Diagnosis not present

## 2022-04-26 DIAGNOSIS — E1122 Type 2 diabetes mellitus with diabetic chronic kidney disease: Secondary | ICD-10-CM | POA: Insufficient documentation

## 2022-04-26 DIAGNOSIS — Z792 Long term (current) use of antibiotics: Secondary | ICD-10-CM | POA: Insufficient documentation

## 2022-04-26 DIAGNOSIS — Z89612 Acquired absence of left leg above knee: Secondary | ICD-10-CM | POA: Diagnosis not present

## 2022-04-26 DIAGNOSIS — I252 Old myocardial infarction: Secondary | ICD-10-CM | POA: Insufficient documentation

## 2022-04-26 DIAGNOSIS — Z86718 Personal history of other venous thrombosis and embolism: Secondary | ICD-10-CM | POA: Insufficient documentation

## 2022-04-26 DIAGNOSIS — Z79899 Other long term (current) drug therapy: Secondary | ICD-10-CM | POA: Insufficient documentation

## 2022-04-26 LAB — POCT INR: INR: 2.4 (ref 2.0–3.0)

## 2022-04-26 MED ORDER — HYDRALAZINE HCL 25 MG PO TABS: ORAL_TABLET | ORAL | 3 refills | Status: DC

## 2022-04-26 MED ORDER — CEFADROXIL 500 MG PO CAPS: 1000.0000 mg | ORAL_CAPSULE | ORAL | 6 refills | Status: DC

## 2022-04-26 MED ORDER — CEFADROXIL 500 MG PO CAPS: 1000.0000 mg | ORAL_CAPSULE | ORAL | 11 refills | Status: DC

## 2022-04-26 NOTE — Telephone Encounter (Signed)
Spoke with LVAD team - Dr. Prescott Gum came to assess site today and no recommendations for surgical debridement at this time. Drainage has improved/nearly resolved with resuming cefadroxil 1gm PO 3x weekly after HD sessions. No local signs of infection.   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 I think given risk factors and recurrence history we should decide to pursue chronic suppression with cefadroxil 1gm post HD 3x a week.   Will schedule a follow up with me in 73mto see how he has been doing on this regimen.    SJanene Madeira MSN, NP-C [facility] CCape [email or web address] Pager: [phone]: [phone] Main Line: [employer] Welcome

## 2022-04-26 NOTE — Progress Notes (Addendum)
Patient presents for 2 month follow up visit follow up with his son. Reports minimal drainage at driveline site. Dr Darcey Nora came to clinic to assess site. Exit site healing and incorporated, the velour is fully implanted at exit site. No signs of tunneling. Minimal redness, tenderness and drainage at site. No foul odor or rash noted. Last dose of Cefadroxil 04/25/21. Dicussed with [name] NP with Infectious Disease and suggested to continue Cefadroxil 1049m 3x a week post dialysis.     Pt is very pleasant today and states that he feels great. States that dialysis is going well. Complains of intermittent cough. CXR ordered by Dr MAundra Dubin   Denies lightheadedness, dizziness, chest pain, or signs of bleeding. Denies falls. Transferred himself from wheelchair to stretcher today. Reports good PO intake. PRN Oxy provided by pain clinic for back and groin pain- pain well managed.   BP slightly elevated today. Pt did not take his medications this morning.   Labs received from dialysis were drawn on 04/20/22. Reviewed with Dr MAundra Dubin   Vital Signs:  Doppler Pressure 84 Automatc BP: 148/69 (122) patient reports not taking blood pressure medication this AM HR: 96 SR SPO2: 95% on RA   Weight: UTO lb w/o eqt Last weight: 167 lb pt reported from dialysis center   VAD Indication: Destination therapy- Pt is no longer candidate for heart/kidney transplant due to PAD per [facility].    VAD interrogation & Equipment Management: Speed: 5600 Flow: 4 Power: 4.8w    PI: 7.2   Alarms: none Events: none today; 90+ on dialysis days  Fixed speed: 5600 Low speed limit: 5300   Primary Controller: Replace back up battery in 16 months Back up controller:   Replace back up battery in 18 months   Annual Equipment Maintenance on UBC/PM was performed on 03/30/21.    I reviewed the LVAD parameters from today and compared the results to the patient's prior recorded data. LVAD interrogation was NEGATIVE for  significant power changes, NEGATIVE for clinical alarms and STABLE for PI events/speed drops. No programming changes were made and pump is functioning within specified parameters. Pt is performing daily controller and system monitor self tests along with completing weekly and monthly maintenance for LVAD equipment.   LVAD equipment check completed and is in good working order. Back-up equipment present.   Exit Site Care: Drive line is being maintained twice a week by daughter. Existing VAD dressing removed and site care performed using sterile technique. Drive line exit site cleaned with Chlora prep applicators x 2, allowed to dry, and silver strip applied. Exit site healing and incorporated, the velour is fully implanted at exit site. Minimal redness, tenderness and drainage at site. No foul odor or rash noted. Drive line anchor re-applied and education provided on the importance of using an anchor to prevent further trauma. Pt denies fever or chills.    Provided patient with 7 daily dressing kits for home use.  Significant Events on VAD Support:  -10/2016>> poss drive line infection, CT ABD neg, ID consult-doxy -11/09/16>> admit for poss drive line infection, IV abx -03/24/17>> doxy for poss drive line infection -05/04/17>> drive line debridement with wound-vac -06/2017>> drive line +proteus, IV abx, Bactrim x14 days  Device: St jude Dual Therapies: on 200 bpm Last check: 03/10/22 - One long NSVT arrhythmia detected   BP & Labs: Doppler BP 84    Hgb 11.5 difficult venous access. Result  from dialysis labs 04/20/22 - No S/S of bleeding. Specifically denies melena/BRBPR  or nosebleeds.   LDH 202, difficult venous access. Results from dialysis labs 8/16//23 - Denies tea-colored urine. No power elevations noted on interrogation.     Patient Instructions:  Continue cefadroxil 1078m (2 tablets) on the evening of dialysis Chest xray today  Return to clinic in 2 months  OBobbye MortonRN [place] Coordinator  Office: [phone] 24/7 Pager: [phone]  Cardiology: Dr. MAundra Dubin HPI: 56y.o. with history of CAD s/p anterior MI in 2010 and NSTEMI in 3/18 with DES to pRCA and mLAD and ischemic cardiomyopathy now s/p Heartmate 3 LVAD and ESRD presents for followup of CHF/LVAD.  Patient developed low output heart failure.  He was not deemed to be a good transplant candidate.  He was admitted in 10/18 and Heartmate 3 LVAD was placed.  He had baseline CKD stage 3 likely from diabetic nephropathy and CHF.  He developed peri-op AKI and ended up on dialysis.  No renal recovery to date.  St Jude ICD has been turned back on and ramp echo was done prior to discharge in 10/18, speed increased to 5600 rpm.   He was admitted in 11/18 with nausea/vomiting and abdominal pain.  This appears to have been due to diabetic gastroparesis (extensive GI evaluation). He had atrial flutter this admission that was converted to NSR with amiodarone.  He improved considerably with scheduled Reglan and erythromycin. He was re-admitted later in 11/18 with the same symptoms, suspect diabetic gastroparesis and was again treated with erythromycin.  He was not able to start domperidone due to prolonged QT interval. He had nausea again about a week ago but was able to manage at home with Phenergan suppositories.   Admitted 1/8 -> 09/16/17 for uncontrolled vomiting in setting of diabetic gastroparesis. Improved with phenergan suppositories and given erythromycin TID for 7 days post-discharge.    He was admitted in 2/19 for AV graft placement.  While in the hospital, we turned down his speed.  Since then, he has had considerably fewer PI events on HD days.    He was admitted in 4/19 with nausea/vomiting again, probably gastroparesis flare.   He was admitted in 5/19 with nausea/vomiting, likely gastroparesis flare, treated with erythromycin course which he is finishing.   [facility]   Gastric emptying study, surprisingly, was near normal. However, electrogastrogram showed poor gastric accomodation/capacity, "bradygastria."  Frequent small meals were recommended. Also, early am nausea was thought to be possible GERD.    Admitted 6/26 - 03/19/18 for emergent VAD exchange 03/01/18 in the setting of HM3 outflow graft kink. He [facility] post op, [facility] complicated by leukocytosis and fever. CT abd/chest negative for infection and blood cultures were negative. Post op pain well controlled with rare tramadol. Able to tolerate diet. Evaluated by PT/OT and HH PT recommended for discharge. Ramp echo completed prior to discharge with speed optimization. Resumed 81 mg ASA + coumadin with INR goal 2-3.   He was admitted 03/23/18 with nausea/gastroparesis symptoms and marked HTN at outpatient HD.  Erythromycin was restarted and gastroparesis symptoms were controlled with fall in BP and discharge on 03/25/18.   He was admitted in 9/19 for LUE AV fistula placement.  He was readmitted in 10/19 with his typical gastroparesis symptoms, unable to take po. The fistula later failed and was replaced with a graft.  However, the graft was nonfunctional and developed a hematoma at the  surgical site.    He was admitted in 12/19 with nausea/vomiting from HD. Also with LDH increased to 427 in setting of extensive hematoma around his left arm AV graft.  He has had tingling in his left hand and left grip is weak.    In 7/20, he was admitted with ischemic 4th toe on right foot.  Peripheral angiogram 03/18/19 showed 80% right SFA stenosis and occluded right PT.  He had stenting of SFA and PTCA of PT with restoration of flow.  However, he still needed amputation right 4th toe on 7/16 with intractable pain.  He was readmitted with pain in his right foot, nausea, and vomiting. There was concern for ongoing right foot ischemia with all toes and  a portion of the foot now dusky.  He was treated with IV vancomycin for possible osteomyelitis/infection, but thought that more likely ischemia could explain all the findings.  He was discharged home to followup with vascular surgery.   He was re-admitted in 8/20 with ongoing right foot pain and ended up having transmetatarsal amputation of the right foot.  Left leg angiogram was done with DES to left SFA, only 1 vessel runoff via severely diseased peroneal artery, unable to intervene.   He was readmitted in 9/20 with ongoing right foot/lower leg pain and had right AKA.  He was discharged to [facility].    He was then admitted with gangrene in the remnant of his left foot in 11/20.  He had left AKA.    Patient was in [place] in 11/22 and fractured a metacarpal in his right hand.    Patient had episode of VT in 2/23 with ICD shock.  He was started on amiodarone.   Patient returns for followup of CHF/LVAD.  Stable recently, no ICD discharges. No problems with dialysis.  BP high this morning but has not taken his hydralazine yet.  No lightheadedness. Can transfer without dyspnea.  No chest pain.  No driveline drainage today.  Main complaint is a chronic cough, he is on Protonix.    Reports taking Coumadin as prescribed and adherence to anticoagulation based dietary restrictions. Denies BRBPR or melena. No dark urine or hematuria.   Labs (11/18): hgb 10.4 => 8.7, LDH 213 Labs (08/04/17): hgb 9 Labs (1/19): HgbA1c 6.1 Labs (2/19): hgb 13.2 Labs (3/19): hgb 11.1 Labs (7/19): hgb 9.6 Labs (10/19: hgb 11.8 Labs (12/19): LDH 427 => 237, hgb 7.7 Labs (11/20): hgb 8.1 Labs (1/21): HIV negative, RPR negative Labs (3/21): hgb 8.8 Labs (6/21): hgb 12.4 Labs (8/21): hgb 10.8, LDH 201 Labs (10/21): hgb 12.4, LDH 220 Labs (12/21): hgb 12, LDH 228 Labs (3/22): hgb 11.7 Labs (5/22): hgb 10.7, LDH 180 Labs (11/22): hgb 10.4 Labs (1/23): hgb 10.1 Labs (4/23): T4 normal, TSH mildly elevated 5.4 Labs  (5/23): LDL 187, hgb 10.8  PMH: 1. Chronic systolic CHF: Ischemic cardiomyopathy.  St Jude ICD.  - Echo (11/17) with EF 25-30%, moderate diastolic dysfunction, mild MR, mildly dilated RV with moderately decreased systolic function, peak RV-RA gradient 48 mmHg.  - Echo (3/18) with EF 25-30%, wall motion abnormalities, normal RV size and systolic function.  - Admission 3/18 with cardiogenic shock and AKI, required milrinone gtt.  - Echo (8/18) with EF 20-25%, mild LVH, moderate LV dilation, mild MR.  - CPX (8/18) with peak VO2 12.4, VE/VCO2 slope 35, RER 1.05 => severe HR limitation.  - Heartmate 3 LVAD 10/18.  - Pump thrombosis in setting of HM3 outflow graft kink.  Pump  exchange 6/19 for HM3.  2. CAD: Anterior MI 2005, had bifurcation stenting LAD/diagonal.  Repeat cath 2010 with patent stents.  - NSTEMI 3/18: LHC with 99% ulcerated lesion proximal RCA, 95% mid LAD => PCI with DES to proximal RCA and mid LAD.  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  - Lower extremity venous dopplers (8/18): chronic DVT - V/Q scan (8/18): Normal, no PE.  4. Crohns disease: History of colectomy.  5. GERD 6. Diabetic gastroparesis: Admission 11/18 with abdominal pain, nausea/vomiting.  - [facility] workup 2019: Gastric emptying study near normal.  Negative breath test (no evidence for small bowel bacterial overgrowth).  Electrogastrogram showed poor gastric accomodation/capacity, "bradygastria."  7. HTN 8. Hyperlipidemia 9. Type II diabetes  10. ESRD: Post-op LVAD surgery.  He has had failed AV fistula and AV graft, now dialyzing via catheter . 11. Carotid dopplers (8/18): mild bilateral stenosis.  12. PFTs (8/18): Mild restriction (no IDL on CT chest).  13. Lung nodules: CT chest 8/18 with small bilateral nodules.  14. PAD: ABIs with moderate bilateral disease in 8/18.  - Peripheral angiogram 03/18/19 showed 80% right SFA stenosis and occluded right PT.  He had stenting of SFA and PTCA of PT with restoration of flow.   However, he still needed amputation right 4th toe on 03/21/19 with intractable pain.  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  - Peripheral angiogram left leg: DES to left SFA, only 1 vessel runoff via severely diseased peroneal artery, unable to intervene. 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  - Left AKA 11/20.  15. Atrial flutter: In 11/18 associated with nausea/vomiting from gastroparesis.  16. VT: VT with ICD shock in 11/22.  - VT with ICD shock in 2/23, amiodarone started.  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 90 tablet 3   aspirin EC 81 MG tablet Take 1 tablet (81 mg total) by mouth daily. 90 tablet 3   atorvastatin (LIPITOR) 40 MG tablet Take 1 tablet (40 mg total) by mouth daily at 6 PM. 90 tablet 3   calcitRIOL (ROCALTROL) 0.5 MCG capsule Take 1 capsule (0.5 mcg total) by mouth every Monday, Wednesday, and Friday. 15 capsule 5   citalopram (CELEXA) 20 MG tablet Take 1 tablet (20 mg total) by mouth daily. 30 tablet 6   insulin glargine (LANTUS) 100 UNIT/ML injection Inject 0.3 mLs (30 Units total) into the skin daily. 10 mL 2   LORazepam (ATIVAN) 0.5 MG tablet Take 1 tablet (0.5 mg total) by mouth every 12 (twelve) hours as needed (nausea). 60 tablet 0   metoCLOPramide (REGLAN) 10 MG tablet Take 1 tablet (10 mg total) by mouth 3 (three) times daily. (Patient taking differently: Take 10 mg by mouth 3 (three) times daily before meals.) 90 tablet 6   midodrine (PROAMATINE) 5 MG tablet Take 2 tablets (10 mg total) by mouth every Monday, Wednesday, and Friday. Take 1 tablet before dialysis treatment 60 tablet 6   multivitamin (RENA-VIT) TABS tablet Take 1 tablet by mouth daily.     nitroGLYCERIN (NITROSTAT) 0.4 MG SL tablet Place 1 tablet (0.4 mg total) under the tongue every 5 (five) minutes as needed for chest pain. 90 tablet 3   ondansetron (ZOFRAN-ODT) 4 MG disintegrating  tablet Take by mouth as needed.     oxyCODONE-acetaminophen (PERCOCET) 7.5-325 MG tablet Take 1 tablet by mouth 3 (three) times daily as needed for pain.  pantoprazole (PROTONIX) 40 MG tablet Take 1 tablet (40 mg total) by mouth 2 (two) times daily before lunch and supper. 60 tablet 11   promethazine (PHENERGAN) 12.5 MG tablet TAKE 1 TABLET (12.5 MG TOTAL) BY MOUTH EVERY 6 (SIX) HOURS AS NEEDED FOR NAUSEA OR VOMITING. 30 tablet 3   sevelamer carbonate (RENVELA) 0.8 g PACK packet Take 0.8 g by mouth 3 (three) times daily with meals. 270 each 1   warfarin (COUMADIN) 5 MG tablet TAKE 1 TABLET BY MOUTH EVERY DAY 30 tablet 11   [START ON 04/27/2022] cefadroxil (DURICEF) 500 MG capsule Take 2 capsules (1,000 mg total) by mouth 3 (three) times a week. On evenings following dialysis 24 capsule 11   hydrALAZINE (APRESOLINE) 25 MG tablet Take 1 tablet three times a day on NON-dialysis days only 270 tablet 3   oxyCODONE (ROXICODONE) 5 MG immediate release tablet Take 1 tablet (5 mg total) by mouth 2 (two) times daily as needed for severe pain. Take 1-2 tablets every 4-6 hours for severe pain. (Patient not taking: Reported on 04/26/2022) 30 tablet 0   No current facility-administered medications for this encounter.    Metformin and related   Review of systems complete and found to be negative unless listed in HPI.    BP (!) 84/0   Pulse 96   SpO2 95%     VAD interrogation & Equipment Management: See LVAD nurse's note above.   General: Well appearing this am. NAD.  HEENT: Normal. Neck: Supple, JVP 7-8 cm. Carotids OK.  Cardiac:  Mechanical heart sounds with LVAD hum present.  Lungs:  CTAB, normal effort.  Abdomen:  NT, ND, no HSM. No bruits or masses. +BS  LVAD exit site: Well-healed and incorporated. Dressing dry and intact. No erythema or drainage. Stabilization device present and accurately applied. Driveline dressing changed daily per sterile technique. Extremities:  Bilateral AKA Neuro:   Alert & oriented x 3. Cranial nerves grossly intact. Moves all 4 extremities w/o difficulty. Affect pleasant     ASSESSMENT AND PLAN: 1. PAD/ischemic feet:  He is now s/p bilateral AKAs.  Generally able to wheel his chair and do transfers without dyspnea.  2. Chronic systolic CHF: Ischemic cardiomyopathy.  St Jude ICD.  NSTEMI in March 2018 with DES to LAD and RCA, complicated by cardiogenic shock, low output requiring milrinone.  Echo 8/18 with EF 20-25%, moderate MR. CPX (8/18) with severe functional impairment due to HF.  He is s/p HM3 LVAD placement. He had pump thrombosis 6/19 due to [facility] outflow graft kinking.  He had pump exchange for another HM3.  LVAD parameters stable, no recent low flows with HD. He takes midodrine pre-HD, but needs hydralazine on non-HD days.     - Continue pre-HD midodrine 10 mg.   - Volume managed at dialysis, MWF.   - Continue ASA 81 and warfarin with INR goal 2-2.5.    - Continue hydralazine 25 mg tid on non-HD days (has not had today).  - He is not candidate for transplant now given extensive PAD with amputations.   3. ESRD: HD on M-W-F with midodrine pre-HD.  - Need to allow UF as long as MAP > 70 (would not stop for MAP 70-80).   4.  CAD: NSTEMI in 3/18.  LHC with 99% ulcerated lesion proximal RCA with left to right collaterals, 95% mid LAD stenosis after mid LAD stent.  s/p PCI to RCA and LAD on 11/25/16. No chest pain.  - Continue statin and ASA.  5. h/o DVTs: Factor V Leiden heterozygote. - Anticoagulated. 6. Diabetic gastroparesis: He has seen gastroparesis specialist at [facility]. Surprisingly, gastric emptying study was normal. However, electrogastrogram showed poor gastric accomodation/capacity. Therefore, small frequent meals recommended.  He has been doing much better with this, minimal symptoms.  - Continue reglan 10 mg tid/ac, now off Marinol.  - Continue Protonix bid.  - Limit narcotic use as much as possible.  7. HTN: Takes hydralazine 25 tid on  non-HD days.  8. Chronic pain: Now seen in a pain clinic.  9. Atrial flutter: Paroxysmal.  Not seen recently.  10. VT: H/o VT with ICD shock on 2 occasions. He was started on amiodarone after 2/23 ICD shock.    - Continue amiodarone 200 mg daily, Follow LFTs and TSH.  He will need a regular eye exam.   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  - Will continue cefadroxil long-term (three days/week after HD).  This will be followed by ID.   Followup in 2 months.   Rosco Harriott 04/26/2022

## 2022-04-26 NOTE — Patient Instructions (Signed)
Patient Instructions:  Continue cefadroxil 1011m (2 tablets) on the evening of dialysis Chest xray today  Return to clinic in 2 months

## 2022-05-03 ENCOUNTER — Ambulatory Visit (HOSPITAL_COMMUNITY): Payer: Self-pay | Admitting: Pharmacist

## 2022-05-03 LAB — POCT INR: INR: 2.1 (ref 2.0–3.0)

## 2022-05-10 ENCOUNTER — Ambulatory Visit (HOSPITAL_COMMUNITY): Payer: Self-pay | Admitting: Pharmacist

## 2022-05-10 LAB — POCT INR: INR: 2.2 (ref 2.0–3.0)

## 2022-05-17 ENCOUNTER — Ambulatory Visit (HOSPITAL_COMMUNITY): Payer: Self-pay | Admitting: Pharmacist

## 2022-05-17 LAB — POCT INR: INR: 2.1 (ref 2.0–3.0)

## 2022-05-20 ENCOUNTER — Ambulatory Visit: Payer: Medicare Other | Attending: Family Medicine

## 2022-05-20 DIAGNOSIS — R262 Difficulty in walking, not elsewhere classified: Secondary | ICD-10-CM | POA: Insufficient documentation

## 2022-05-20 DIAGNOSIS — M6281 Muscle weakness (generalized): Secondary | ICD-10-CM | POA: Insufficient documentation

## 2022-05-20 NOTE — Therapy (Unsigned)
OUTPATIENT PHYSICAL THERAPY WHEELCHAIR EVALUATION   Patient Name: [name] MRN: [id number] DOB:Oct 31, 1965, 56 y.o., male Today's Date: 05/20/2022   PT End of Session - 05/20/22 1452     Visit Number 1    Number of Visits 1    Authorization Type U[employer] Medicare    PT Start Time 7793    PT Stop Time 9030    PT Time Calculation (min) 38 min    Activity Tolerance Patient tolerated treatment well    Behavior During Therapy WFL for tasks assessed/performed             Past Medical History:  Diagnosis Date   Angina    ASCVD (arteriosclerotic cardiovascular disease)    , Anterior infarction 2005, LAD diagonal bifurcation intervention 03/2004   Automatic implantable cardiac defibrillator -St. Jude's        Benign neoplasm of colon    CHF (congestive heart failure) (HCC)    Chronic systolic heart failure ([place])    Coronary artery disease     Widely patent previously placed stents in the left anterior    Crohn's disease ([place])    Deep venous thrombosis ([place])    Recurrent-on Coumadin   Dialysis patient ([place])    Dyspnea    Gastroparesis    GERD (gastroesophageal reflux disease)    High cholesterol    Hyperlipidemia    Hypersomnolent    Previous diagnosis of narcolepsy   Hypertension, essential    Ischemic cardiomyopathy    Ejection fraction 15-20% catheterization 2010   Type II or unspecified type diabetes mellitus without mention of complication, not stated as uncontrolled    Unspecified gastritis and gastroduodenitis without mention of hemorrhage    Past Surgical History:  Procedure Laterality Date   A/V SHUNTOGRAM N/A 05/24/2018   Procedure: A/V SHUNTOGRAM;  Surgeon: Marty Heck, MD;  Location: [facility];  Service: Cardiovascular;  Laterality: N/A;   ABDOMINAL AORTOGRAM W/LOWER EXTREMITY N/A 03/18/2019   Procedure: ABDOMINAL AORTOGRAM W/LOWER EXTREMITY;  Surgeon: Waynetta Sandy, MD;  Location: [facility];  Service:  Cardiovascular;  Laterality: N/A;   AMPUTATION Right 03/21/2019   Procedure: AMPUTATION DIGIT RIGHT FOURTH TOE;  Surgeon: Waynetta Sandy, MD;  Location: [place];  Service: Vascular;  Laterality: Right;   AMPUTATION Left 05/29/2019   Procedure: AMPUTATION LEFT GREAT TOE;  Surgeon: Serafina Mitchell, MD;  Location: [place];  Service: Vascular;  Laterality: Left;   AMPUTATION Right 06/02/2019   Procedure: ABOVE KNEE AMPUTATION;  Surgeon: Rosetta Posner, MD;  Location: [place];  Service: Vascular;  Laterality: Right;   AMPUTATION Left 07/23/2019   Procedure: AMPUTATION ABOVE KNEE LEFT;  Surgeon: Elam Dutch, MD;  Location: [place];  Service: Vascular;  Laterality: Left;   APPENDECTOMY     AV FISTULA PLACEMENT Right 06/26/2017   Procedure: ARTERIOVENOUS (AV) FISTULA CREATION VERSUS GRAFT RIGHT  ARM;  Surgeon: Conrad Leo-Cedarville, MD;  Location: [facility];  Service: Vascular;  Laterality: Right;   AV FISTULA PLACEMENT Right 10/31/2017   Procedure: INSERTION OF ARTERIOVENOUS (AV) ARTEGRAFT,  RIGHT UPPER ARM;  Surgeon: Conrad [place]ville, MD;  Location: [place];  Service: Vascular;  Laterality: Right;   AV FISTULA PLACEMENT Left 05/29/2018   Procedure: ARTERIOVENOUS (AV) FISTULA CREATION  LEFT UPPER EXTREMITY;  Surgeon: Waynetta Sandy, MD;  Location: [facility];  Service: Vascular;  Laterality: Left;   AV FISTULA PLACEMENT Left 08/09/2018   Procedure: INSERTION OF ARTERIOVENOUS (AV) GORE-TEX  GRAFT LEFT UPPER ARM;  Surgeon: Waynetta Sandy, MD;  Location: [place];  Service: Vascular;  Laterality: Left;   CARDIAC DEFIBRILLATOR PLACEMENT  2010   St. Jude ICD   CENTRAL LINE INSERTION Right 05/24/2018   Procedure: CENTRAL LINE INSERTION;  Surgeon: Marty Heck, MD;  Location: [facility];  Service: Cardiovascular;  Laterality: Right;   COLECTOMY  ~ 2003   "for Crohn's" ascending colon.     CORONARY STENT INTERVENTION N/A 11/25/2016   Procedure: Coronary Stent Intervention;  Surgeon: Leonie Man, MD;  Location: [facility];  Service: Cardiovascular;  Laterality: N/A;   EP IMPLANTABLE DEVICE N/A 09/14/2016   Procedure: ICD Generator Changeout;  Surgeon: Deboraha Sprang, MD;  Location: [facility];  Service: Cardiovascular;  Laterality: N/A;   ESOPHAGOGASTRODUODENOSCOPY N/A 07/12/2017   Procedure: ESOPHAGOGASTRODUODENOSCOPY (EGD);  Surgeon: Jerene Bears, MD;  Location: [facility];  Service: Gastroenterology;  Laterality: N/A;  VAD pt so VAD team will need to accompany pt.    FETAL SURGERY FOR CONGENITAL HERNIA     ????? pt has no knowledge of this.[name]     FISTULOGRAM Left 08/09/2018   Procedure: FISTULOGRAM LEFT ARM;  Surgeon: Waynetta Sandy, MD;  Location: [place];  Service: Vascular;  Laterality: Left;   INGUINAL HERNIA REPAIR     INSERTION OF DIALYSIS CATHETER Right 06/26/2017   Procedure: INSERTION OF TUNNELED  DIALYSIS CATHETER RIGHT INTERNAL JUGULAR;  Surgeon: Conrad Newport, MD;  Location: [place];  Service: Vascular;  Laterality: Right;   INSERTION OF IMPLANTABLE LEFT VENTRICULAR ASSIST DEVICE N/A 06/12/2017   Procedure: INSERTION OF IMPLANTABLE LEFT VENTRICULAR ASSIST [place] 3;  Surgeon: Ivin Poot, MD;  Location: [employer];  Service: Open Heart Surgery;  Laterality: N/A;  HM3 LVAD  CIRC ARREST  NITRIC OXIDE   INSERTION OF IMPLANTABLE LEFT VENTRICULAR ASSIST DEVICE N/A 02/28/2018   Procedure: REDO INSERTION OF IMPLANTABLE LEFT VENTRICULAR ASSIST DEVICE - HEARTMATE 3;  Surgeon: Ivin Poot, MD;  Location: [place];  Service: Open Heart Surgery;  Laterality: N/A;   IR FLUORO GUIDE CV LINE RIGHT  07/22/2019   IR REMOVAL TUN CV CATH W/O FL  02/26/2018   IR THORACENTESIS ASP PLEURAL SPACE W/IMG GUIDE  06/28/2017   IR US GUIDE VASC ACCESS RIGHT  07/22/2019   LIGATION OF ARTERIOVENOUS  FISTULA Right 10/31/2017   Procedure: LIGATION OF BRACHIO-BASILIC VEIN TRANSPOSITION RIGHT ARM;  Surgeon: Conrad Verlot, MD;  Location: [place];  Service: Vascular;   Laterality: Right;   LOWER EXTREMITY ANGIOGRAPHY  05/02/2019   Procedure: Lower Extremity Angiography;  Surgeon: Marty Heck, MD;  Location: [facility];  Service: Cardiovascular;;   MULTIPLE EXTRACTIONS WITH ALVEOLOPLASTY N/A 06/08/2017   Procedure: MULTIPLE EXTRACTION WITH ALVEOLOPLASTY AND PRE PROSTHETIC SURGERY AS NEEDED;  Surgeon: Lenn Cal, DDS;  Location: [place];  Service: Oral Surgery;  Laterality: N/A;   PERIPHERAL VASCULAR BALLOON ANGIOPLASTY  03/18/2019   Procedure: PERIPHERAL VASCULAR BALLOON ANGIOPLASTY;  Surgeon: Waynetta Sandy, MD;  Location: [facility];  Service: Cardiovascular;;  RT PT   PERIPHERAL VASCULAR INTERVENTION  03/18/2019   Procedure: PERIPHERAL VASCULAR INTERVENTION;  Surgeon: Waynetta Sandy, MD;  Location: [facility];  Service: Cardiovascular;;  RT SFA   PERIPHERAL VASCULAR INTERVENTION  05/02/2019   Procedure: PERIPHERAL VASCULAR INTERVENTION;  Surgeon: Marty Heck, MD;  Location: [facility];  Service: Cardiovascular;;  Lt. SFA  RIGHT HEART CATH N/A 06/07/2017   Procedure: RIGHT HEART CATH;  Surgeon: Larey Dresser, MD;  Location: [facility];  Service: Cardiovascular;  Laterality: N/A;   RIGHT HEART CATH N/A 02/08/2018   Procedure: RIGHT HEART CATH;  Surgeon: Larey Dresser, MD;  Location: [facility];  Service: Cardiovascular;  Laterality: N/A;   RIGHT HEART CATH N/A 08/09/2018   Procedure: RIGHT HEART CATH;  Surgeon: Larey Dresser, MD;  Location: [place] [place] [facility];  Service: Cardiovascular;  Laterality: N/A;   RIGHT/LEFT HEART CATH AND CORONARY ANGIOGRAPHY N/A 11/23/2016   Procedure: Right/Left Heart Cath and Coronary Angiography;  Surgeon: Larey Dresser, MD;  Location: [facility];  Service: Cardiovascular;  Laterality: N/A;   TEE WITHOUT CARDIOVERSION N/A 06/12/2017   Procedure: TRANSESOPHAGEAL ECHOCARDIOGRAM (TEE);  Surgeon: Prescott Gum, Collier Salina, MD;  Location: [place] [facility];  Service: Open Heart Surgery;  Laterality: N/A;   TEE WITHOUT CARDIOVERSION N/A 02/28/2018   Procedure: TRANSESOPHAGEAL ECHOCARDIOGRAM (TEE);  Surgeon: Prescott Gum, Collier Salina, MD;  Location: [place];  Service: Open Heart Surgery;  Laterality: N/A;   TEMPORARY DIALYSIS CATHETER Left 05/24/2018   Procedure: TEMPORARY DIALYSIS CATHETER;  Surgeon: Marty Heck, MD;  Location: [facility];  Service: Cardiovascular;  Laterality: Left;   THROMBECTOMY AND REVISION OF ARTERIOVENTOUS (AV) GORETEX  GRAFT Right 12/12/2017   Procedure: THROMBECTOMY ARTERIOVENTOUS (AV) GORETEX  GRAFT RIGHT UPPER ARM;  Surgeon: Conrad Westminster, MD;  Location: [facility];  Service: Vascular;  Laterality: Right;   TRANSMETATARSAL AMPUTATION Right 04/30/2019   Procedure: RIGHT TRANSMETATARSAL AMPUTATION;  Surgeon: Waynetta Sandy, MD;  Location: [place];  Service: Vascular;  Laterality: Right;   Patient Active Problem List   Diagnosis Date Noted   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[place]) 12/29/2021   Facial abscess 09/08/2021   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[place]) 09/24/2020   Penile ulcer 09/19/2019   PAD (peripheral artery disease) ([place]) 07/23/2019   Hospital discharge follow-up 04/15/2019   Warfarin anticoagulation 04/15/2019   Heart failure ([place]) 05/23/2018   Benign essential HTN    Crohn's disease with complication ([place])    Diabetes mellitus type 2 in nonobese ([place])    Anemia of chronic disease    Sinus tachycardia    Factor V Leiden carrier ([place]) 01/10/2018   History of creation of ostomy ([place]) 01/10/2018   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) ([place]) 64/15/8309   Chronic systolic CHF (congestive heart failure) ([place]) 06/07/2017   DNR (do not resuscitate) discussion    CHF exacerbation ([place]) 04/05/2017   Diabetic keto-acidosis ([place]) 04/05/2017   ESRD on dialysis ([place]) 01/17/2017   Non-ST elevation (NSTEMI) myocardial infarction [facility])    CHF  (congestive heart failure) ([place]) 09/02/2016   SVT (supraventricular tachycardia) ([place]) 04/26/2012   Gastroparesis 11/18/2011   Coronary artery disease    Ischemic cardiomyopathy    Essential hypertension    Automatic implantable cardioverter-defibrillator in situ    POLYP, COLON 09/25/2008   DIVERTICULOSIS, COLON 09/25/2008   History of Crohn's disease 09/05/2006    PCP: Precious Gilding, DO  REFERRING PROVIDER: Leeanne Rio, MD   THERAPY DIAG:  Difficulty in walking, not elsewhere classified  Muscle weakness (generalized)  Rationale for Evaluation and Treatment Rehabilitation  SUBJECTIVE:  SUBJECTIVE STATEMENT: Pt present or wheelchair evaluation. Patient reports doing well. Currently in standard wheelchair 38-29years old with noted wear and tear.   PRECAUTIONS: Fall and Other: LVAD  WEIGHT BEARING RESTRICTIONS No   OCCUPATION: on disability  PLOF: Needs assistance with ADLs, Needs assistance with homemaking, and Needs assistance with transfers  PATIENT GOALS "to get a good wheelchair"      MEDICAL HISTORY:  Primary diagnosis onset: 2018 Diagnosis  Code: G26.948; Z89.612 Diagnosis: s/p AKA bilateral    Diagnosis code:       Diagnosis: LVAD Z95. 811 Diagnosis  Code:  Diagnosis:   [] Progressive disease  Relevant future surgeries:     Height:  B AKA without prosthetics Weight: 164lbs  Explain recent changes or trends in weight:    Patient is on dialysis and has mild fluctuations to his weight  History:  Past Medical History:  Diagnosis Date   Angina    ASCVD (arteriosclerotic cardiovascular disease)    , Anterior infarction 2005, LAD diagonal bifurcation intervention 03/2004   Automatic implantable cardiac defibrillator -St. Jude's        Benign neoplasm of colon    CHF  (congestive heart failure) (HCC)    Chronic systolic heart failure ([place])    Coronary artery disease     Widely patent previously placed stents in the left anterior    Crohn's disease ([place])    Deep venous thrombosis ([place])    Recurrent-on Coumadin   Dialysis patient ([place])    Dyspnea    Gastroparesis    GERD (gastroesophageal reflux disease)    High cholesterol    Hyperlipidemia    Hypersomnolent    Previous diagnosis of narcolepsy   Hypertension, essential    Ischemic cardiomyopathy    Ejection fraction 15-20% catheterization 2010   Type II or unspecified type diabetes mellitus without mention of complication, not stated as uncontrolled    Unspecified gastritis and gastroduodenitis without mention of hemorrhage             Cardio Status:  Functional Limitations:   [] Intact  [x]  Impaired    LVAD (HM3)  Respiratory Status:  Functional Limitations:   [] Intact  [x] Impaired   [] SOB [] COPD [] O2 Dependent ______LPM  [] Ventilator Dependent  Resp equip:                                                     Objective Measure(s):   Orthotics:   [x] Amputee:           B AKA                                                  [] Prosthesis:      HOME ENVIRONMENT:  [x] House [] Condo/town home [] Apartment [] Asst living [] LTCF         [x] Own  [] Rent   [] Lives alone [x] Lives with others -      daughter, 2 sons and grandsons                       Hours without assistance: 0   [x] Home is accessible to patient  ramped entrance, hardwood floors on main level he's on                Storage of wheelchair:  [x] In home   [] Other Comments:      COMMUNITY :  TRANSPORTATION:  [x] Car [] Van [] Public Transportation [] Adapted w/c Lift []  Ambulance [] Other:                     [] Sits in wheelchair during transport   Where is w/c stored during transport? Trunk of car [] Tie Downs  []  EZ Lock  r   [] Self-Driver       Drive while in  Systems analyst [] yes [x] no   Employment and/or school:  Specific  requirements pertaining to mobility        Other:  COMMUNICATION:  Verbal Communication  [x] WFL [] receptive [x] WFL [] expressive [] Understandable  [] Difficult to understand  [] non-communicative  Primary Language:_____english_________ 2nd:_____________  Communication provided by:[x] Patient [] Family [] Caregiver [] Translator   [] Uses an augmentative communication device     Manufacturer/Model :     MOBILITY/BALANCE:  Sitting Balance  Standing Balance  Transfers  Ambulation   [] WFL      [] WFL  [x] Independent  []  Independent   [x] Uses UE for balance in sitting Comments:  [] Uses UE/device for stability Comments:  []  Min assist  []  Ambulates independently with       device:___________________      []  Mod assist  []  Able to ambulate ______ feet        safely/functionally/independently   []  Min assist  []  Min assist  []  Max assist  []  Non-functional ambulator         History/High risk of falls   []  Mod assist  []  Mod assist  []  Dependent  [x]  Unable to ambulate   []  Max  assist  []  Max assist  Transfer method:r1 person r2 person sliding board rsquat pivot rstand pivot rmechanical patient lift  rother:   []  Unable  [x]  Unable    Fall History: # of falls in the past 6 months? 0 # of "near" falls in the past 6 months? 0    CURRENT SEATING / MOBILITY:  Current Mobility Device: [] None [] Cane/Walker [x] Manual [] Dependent [] Dependent w/ Tilt rScooter [] Power (type of control):   Manufacturer: standard manual  Model:  Serial #:   Size:  Color:  Age:   Purchased by whom:   Current condition of mobility base:    Current seating system:                                                                       Age of seating system:    Describe posture in present seating system:    Is the current mobility meeting medical necessity?:  [] Yes [x] No Describe: Patient and family have difficulty transporting chair due to its weight. Patient also with decreased tolerance for long distance propulsion due to weight of  chair and impaired cardiorespiratory status.     Ability to complete Mobility-Related Activities of Daily Living (MRADL's) with Current Mobility Device:   Move room to room  [x] Independent  [] Min [] Mod [] Max assist  [] Unable  Comments:   Meal prep  [] Independent  [] Min [] Mod [] Max assist  [] Unable    Feeding  [] Independent  []   Min [] Mod [] Max assist  [] Unable    Bathing  [] Independent  [] Min [] Mod [] Max assist  [] Unable    Grooming  [] Independent  [x] Min [] Mod [] Max assist  [] Unable    UE dressing  [] Independent  [x] Min [] Mod [] Max assist  [] Unable    LE dressing  [] Independent   [x] Min [] Mod [] Max assist  [] Unable    Toileting  [] Independent  [x] Min [x] Mod [] Max assist  [] Unable    Bowel Mgt: [x]  Continent []  Incontinent []  Accidents []  Diapers []  Colostomy []  Bowel Program:  Bladder Mgt: [x]  Continent []  Incontinent []  Accidents []  Diapers []  Urinal []  Intermittent Cath []  Indwelling Cath []  Supra-pubic Cath     Current Mobility Equipment Trialed/ Ruled Out:    Does not meet mobility needs due to:    Mark all boxes that indicate inability to use the specific equipment listed     Meets needs for safe  independent functional  ambulation  / mobility    Risk of  Falling or History of Falls    Enviromental limitations      Cognition    Safety concerns with  physical ability    Decreased / limitations endurance  & strength     Decreased / limitations  motor skills  & coordination    Pain    Pace /  Speed    Cardiac and/or  respiratory condition    Contra - indicated by diagnosis   Cane/Crutches  []   []   []   []   []   []   []   []   []   []   []    Walker / Rollator  []  NA   []   []   []   []   []   []   []   []   []   []   [x]     Manual Wheelchair [id number]:  []  NA  [x]   []   []   []   []   []   []   []   []   []   []    Manual W/C (K0005) with power assist  [x]  NA  []   []   []   []   []   []   []   []   []   []   []    Scooter  [x]  NA  []   []   []   []   []   []   []   []   []   []   []    Power Wheelchair: standard  joystick  [x]  NA  []   []   []   []   []   []   []   []   []   []   []    Power Wheelchair: alternative controls  [x]  NA  []   []   []   []   []   []   []   []   []   []   []    Summary:  The least costly alternative for independent functional mobility was found to be:    []  Crutch/Cane  []  Walker [x]  Manual w/c  []  Manual w/c with power assist   []  Scooter   []  Power w/c std joystick   []  Power w/c alternative control        []  Requires dependent care mobility device   Media planner for [employer] skills are adequate for safe mobility equipment operation  [x]   Yes []   No  Patient is willing and motivated to use recommended mobility equipment  [x]   Yes []   No       []  Patient is unable to safely operate mobility equipment independently and requires dependent care equipment Comments:           SENSATION and SKIN  ISSUES:  Sensation [x]  Intact  []  Impaired []  Absent []  Hyposensate []  Hypersensate  []  Defensiveness  Location(s) of impairment:    Pressure Relief Method(s):  [x]  Lean side to side to offload (without risk of falling)  [x]   W/C push up (4+ times/hour for 15+ seconds) []  Stand up (without risk of falling)    []  Other: (Describe): Effective pressure relief method(s) above can be performed consistently throughout the day: rYes  r No If not, Why?:  Skin Integrity Risk:       []  Low risk           [x]  Moderate risk            []  High risk  If high risk, explain:   Skin Issues/Skin Integrity  Current skin Issues  []  Yes [x]  No []  Intact  []   Red area   []   Open area  []  Scar tissue  [x]  At risk from prolonged sitting  Where: ischial tuberosities, coccyx History of Skin Issues  [x]  Yes []  No Where : coccyx/ischial tuberosity (patient could not remember laterality) When: "a few years ago" Stage: "open skin"  Hx of skin flap surgeries  []  Yes [x]  No Where:  When:  Pain: [x]  Yes []  No   Pain Location(s): intermittent phantom pain  Intensity scale: (0-10) : How  does pain interfere with mobility and/or MRADLs? -  Patient with intermittent UE strain due to need to propel heavier standard wheelchair in order to complete all of his MRADLs.    MAT EVALUATION:  Neuro-Muscular Status: (Tone, Reflexive, Responses, etc.)     [x]   Intact   []  Spasticity:  []  Hypotonicity  []  Fluctuating  []  Muscle Spasms  []  Poor Righting Reactions/Poor Equilibrium Reactions  []  Primal Reflex(s):    Comments:            COMMENTS:    POSTURE:     Comments:  Pelvis Anterior/Posterior:  []  Neutral   [x]  Posterior  []  Anterior  []  Fixed - No movement [x]  Tendency away from neutral [x]  Flexible [x]  Self-correction [x]  External correction Obliquity (viewed from front)  [x]  WFL []  R Obliquity []  L Obliquity  []  Fixed - No movement []  Tendency away from neutral []  Flexible []  Self-correction []  External correction Rotation  [x]  WFL []  R anterior []  L anterior  []  Fixed - No movement []  Tendency away from neutral []  Flexible []  Self-correction []  External correction Tonal Influence Pelvis:  [x]  Normal []  Flaccid []  Low tone []  Spasticity []  Dystonia []  Pelvis thrust []  Other:    Trunk Anterior/Posterior:  [x]  WFL []  Thoracic kyphosis []  Lumbar lordosis  []  Fixed - No movement []  Tendency away from neutral []  Flexible []  Self-correction []  External correction  [x]  WFL []  Convex to left  []  Convex to right []  S-curve   []  C-curve []  Multiple curves []  Tendency away from neutral []  Flexible []  Self-correction []  External correction Rotation of shoulders and upper trunk:  [x]  Neutral []  Left-anterior []  Right- anterior []  Fixed- no movement []  Tendency away from neutral []  Flexible []  Self correction []  External correction Tonal influence Trunk:  [x]  Normal []  Flaccid []  Low tone []  Spasticity []  Dystonia []  Other:   Head & Neck  [x]  Functional []  Flexed    []  Extended []  Rotated right  []  Rotated left []   Laterally flexed right []  Laterally flexed left []  Cervical hyperextension   [x]  Good head control []  Adequate head control []  Limited head control []   Absent head control Describe tone/movement of head and neck:    Lower Extremity Measurements: LE ROM:  Active ROM Right 05/20/2022 Left 05/20/2022  Hip flexion [facility]      Hip abduction [facility]  Hip adduction [facility] WFL   LE MMT:  MMT Right 05/20/2022 Left 05/20/2022  Hip flexion 4 4  Hip extension    Hip abduction 4 4  Hip adduction 4 4                   (Blank rows = not tested)  Hip positions:  [x]  Neutral   []  Abducted   []  Adducted  []  Subluxed   []  Dislocated   []  Fixed   []  Tendency away from neutral []  Flexible []  Self-correction []  External correction   Hip Windswept:[x]  Neutral  []  Right    []  Left  []  Subluxed   []  Dislocated   []  Fixed   []  Tendency away from neutral []  Flexible []  Self-correction []  External correction  LE Tone: [x]  Normal []  Low tone []  Spasticity []  Flaccid []  Dystonia []  Rocks/Extends at hip []  Thrust into knee extension []  Pushes legs downward into footrest  UE Measurements:  UPPER EXTREMITY ROM:   Active ROM Right 05/20/2022 Left 05/20/2022  Shoulder flexion [facility]  Shoulder abduction [facility]  Shoulder adduction [facility] WFL  Elbow flexion WFL WFL  Elbow extension [facility] WFL  Wrist flexion WFL WFL  Wrist extension WFL WFL  (Blank rows = not tested)  UPPER EXTREMITY MMT:  MMT Right 05/20/2022 Left 05/20/2022  Shoulder flexion 4 4  Shoulder abduction 4 4  Shoulder adduction    Elbow flexion 4 4  Elbow extension 4 4  Wrist flexion    Wrist extension    Pinch strength    Grip strength WFL WFL  (Blank rows = not tested)  Shoulder Posture:  Right Tendency towards Left  [x]   Functional [x]    []   Elevation []    []   Depression []    []   Protraction []    []   Retraction []    []   Internal rotation []    []   External rotation []    []   Subluxed []     UE Tone: [x]   Normal []  Flaccid []  Low tone []  Spasticity  []  Dystonia []  Other:   Wrist/Hand: Handedness: [x]  Right   []  Left   []  NA: Comments:  Right  Left  [x]   WNL [x]    []   Limitations []    []   Contractures []    []   Fisting []    []   Tremors []    []   Weak grasp []    []   Poor dexterity []    []   Hand movement non functional []    []   Paralysis []     MOBILITY BASE RECOMMENDATIONS and JUSTIFICATION:  MOBILITY BASE  JUSTIFICATION   Manufacturer:   [employer]  Model:   Catalyst 5 VX Color: flat black  Seat Width:  18" Seat Depth 19"   [x]  Manual mobility base (continue below)   []  Scooter/POV  []  Power mobility base   Number of hours per day spent in above selected mobility base: ~12  Typical daily mobility base use Schedule: Patient in chair essentially all waking hours of the day  [x]  is not a safe, functional ambulator  [x]  limitation prevents from completing a MRADL(s) within a reasonable time frame    [x]  limitation places at high risk of morbidity or mortality secondary to  the attempts to perform a  MRADL(s)  []  limitation prevents accomplishing a MRADL(s) entirely  [x]  provide independent mobility  [x]  equipment is a lifetime medical need  [x]  walker or cane inadequate  [x]  any type manual wheelchair      inadequate  [x]  scooter/POV inadequate      []  requires dependent mobility        MANUAL MOBILITY                       [x]  Ultra-lightweight manual wheelchair  K0005     Arm:    [x]  both []  right  []  left     Foot:   []  both []  right  []  left      []  []  hemi height required  []  heavy duty    Front seat to floor __19.5___ inches      Rear seat to floor _18.5____ inches      Back height _____ inches     Back angle ______ degrees      Front angle _____ degrees  [x]   full-time manual wheelchair user  [x]  Requires individualized fitting and optimal adjustments for multiple features that include adjustable axle configuration, fully adjustable center of  gravity, wheel camber, seat and back angle, angle of seat slope, which cannot be accommodated by a K0001 through K0004 manual wheelchair  [x]  prevent repetitive use injuries  [x]  daily use____12_____hours   [x]  user has high activity patterns that frequently require  them  to go out into the community for the purpose of independently accomplishing high level MRADL activities. Examples of these might include a combination of; shopping, work, school, Science writer, childcare, independently loading and unloading from a vehicle etc.  []  lower seat height required to foot propel  []  short stature  []  heavy duty -  weight over 250lbs   []  Current chair is a K0005   manufacture:___________________  model:_________________  serial#____________________  age:_________    [x]  First time [id number] user (complete trial)  K0004 time and # of strokes to propel 30 feet: ________seconds _________strokes  [id number] time and # of strokes to propel 30 feet: ________seconds _________strokes  What was the result of the trial between the K0004 and K0005 manual wheelchair? ___ **No K5 wheelchair available to trial    What features of the K0005 w/c are needed as compared to the K0004 base? Why?___ Patient requires the light weight of the [place] wheelchair compared to the K0004 in order to limit excessive strain on his upper extremities. 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    [x]  adjustable seat and back angle changes the angle of seat slope of the frame to attain a gravity assisted position for efficient propulsion and proper weight distribution along the frame     [x]  the front of the wheelchair will be configured higher than the back of the chair to allow gravity to assist the user with postural stability  [x]  the center of the wheel will be positioned for stability, safety and efficient propulsion  [x]   adjustable axle allows for vertical, horizontal, camber and overall width changes  throughout the wheels for adjustment of the client's exact needs and abilities.   [x]  adjustable axle increases the stability and function of the chair allowing for adjustment of the center of gravity.   []  accommodates the client's anatomical position in the chair maximizing independence in mobility and maneuverability in all  environments.   [x]  create a minimal fixed tilt-in space to assist in positioning.   [x]  Describe users full-time manual wheelchair activity patterns:__Patient would transfer into his wc via slideboard to complete all of his MRADLs. _    []  Power assist Comments:  []  prevent repetitive use injuries  []  repetitive strain injury present in    shoulder girdle    []  shoulder pain is (> or =) to 7/10     during manual propulsion       Current Pain _____/10  []  requires conservation of energy to participate in MRADL(s) runable to propel up ramps or curbs using manual wheelchair  []  been K0005 user greater than one year  []  user unwilling to use power      wheelchair (reason): []  less expensive option to power   wheelchair   []  rim activated power assist -      decreased strength   []  Heavy duty manual wheelchair       K0006     Arm:    []  both []  right  []  left     Foot:   []  both []  right  []  left     []  hemi height required    []  Dependent base  []  user exceeds 250lbs  []  non-functional ambulator    []  extreme spasticity  []  over active movement   []  broken frame/hx of repeated     repairs  []  able to self-propel in residence       []  lower seat to floor height required  []  unable to self-propel in residence   []  Extra heavy duty manual wheelchair  K0007     Arm:    []  both []  right  []  left     Foot:   []  both []  right  []  left     []  hemi height required  []  Dependent base  []  user exceeds 300lbs  []  non-functional ambulator    []  able to self-propel in residence   []  lower  seat to floor height required  []  unable to self-propel in residence     []  Manual wheelchair with tilt [phone]      (Manual "Tilt-n-Space")  []  patient is dependent for transfers  []  patient requires frequent       positioning for pressure relief   []  patient requires frequent      positioning for poor/absent trunk control        []  Stroller Base  []  infant/child   []  unable to propel manual      wheelchair  []  allows for growth  []  non-functional ambulator  []  non-functional UE  []  independent mobility is not a goal at this time    [place] handles  []  extended  rangle adjustable   [x]  standard  [x]  caregiver access  [x]  caregiver assist    []  allows "hooking" to enable      increased ability to perform ADLs or maintain balance   []  Angle Adjustable Back  []  postural control  []  control of tone/spasticity  []  accommodation of range of motion  []  UE functional control  []  accommodation for seating system    Rear wheel placement  []  std/fixed fully adjustable amputee   []  camber ________degree  [x]  removable rear wheel  []  non-removable rear wheel  Wheel size _24"______  Wheel style____Mag___________________  [x]  improved UE access to wheels  [x]  increase propulsion ability  [x]  improved stability  []   changing angle in space for      improvement of postural stability  [x]  remove for transport    []  allow for seating system to fit on      base  []  amputee placement  []  1-arm drive access   r R  r L  []  enable propulsion of manual       wheelchair with one arm    []  amputee placement   Wheel rims/ Hand rims  [x]  Standard    []  Specialized-____ [x]  provide ability to propel manual   []  increase self-propulsion with hand wheelchair weakness/decreased grasp     []  Spoke protector/guard   []  prevent hands from getting caught in spokes   Tires:  []  pneumatic  []  flat free inserts  [x]  solid  Style:  [x]  decrease roll resistance              [x]  prevent  frequent flats  []  increase shock absorbency  [x]  decrease maintenance   []  decrease pain from road shock    []  decrease spasms from road shock    Wheel Locks:    [x]  push []  pull []  scissor  [x]  lock wheels for transfers  [x]  lock wheels from rolling   Brake/wheel lock extension:  [x]  R  [x]  L  [x]  allow user to operate wheel locks due to decreased reach or strength   Caster housing:  Caster size:        6"x1.5"        Style:                                          []  suspension fork  [x]  maneuverability   [x]  stability of wheelchair   [x]  durability  [x]  maintenance  []  angle adjustment for posture  []  allow for feet to come under        wheelchair base  []  allows change in seat to floor      height   []  increase shock absorbency  []  decrease pain from road shock  []  decrease spasms from road    shock   []  Side guards  []  prevent clothing getting caught in      wheel or becoming soiled  rprovide hip and pelvic stability  []  eliminates contact between body and wheels  []  limit hand contact with wheels   [x]  Anti-tippers      [x]  prevent wheelchair from tipping    backward  [x]  assist caregiver with curbs    CHAIR OPTIONS MANUAL & POWER      Armrests   [x]  adjustable height rremovable  []  swing away []  fixed  [x]  flip back  []  reclining  []  full length pads [x]  desk []  tube arms []  gel pads  [x]  provide support with elbow at 90    [x]  remove/flip back/swing away for  transfers  [x]  provide support and positioning of upper body    [x]  allow to come closer to table top  [x]  remove for access to tables  []  provide support for w/c tray  [x]  change of height/angles for       variable activities   []  Elbow support / Elbow stop  []  keep elbow positioned on arm pad  []  keep arms from falling off arm pad      during tilt and/or recline   Upper Extremity Support  []  Arm  trough  []   R  []   L  Style:  []  swivel mount []  fixed mount   []  posterior hand support  []   tray  []  full  tray  []  joystick cut out  []   R  []   L  Style:  []  decrease gravitational pull on      shoulders  []  provide support to increase UE  function  []  provide hand support in natural    position  []  position flaccid UE  []  decrease subluxation    rdecrease edema       []  manage spasticity   []  provide midline positioning  []  provide work surface  []  placement for AAC/ Computer/ EADL     Hangers/ Legrests   []  ______ degree  []  Elevating []  articulating  []  swing away []  fixed []  lift off  []  heavy duty []  adjustable knee angle  []  adjustable calf panel   []  longer extension tube              []  provide LE support  []  maintain placement of feet on      footplate   []  accommodate lower leg length  []  accommodate to hamstring       tightness  []  enable transfers  []  provide change in position for LE's  []  elevate legs during recline    []  decrease edema  []  durability      Foot support   []  footplate []  R []  L []  flip up           []  Depth adjustable   []  angle adjustable  []  foot board/one piece    []  provide foot support  []  accommodate to ankle ROM  []  allow foot to go under wheelchair base  []  enable transfers     []  Shoe holders  []  position foot    []  decrease / manage spasticity  []  control position of LE  []  stability    []  safety     []  Ankle strap/heel      loops  []  support foot on foot support  []  decrease extraneous movement  []  provide input to heel   []  protect foot     []  Amputee adapter []  R  []  L     Style:                  Size:  []  Provide support for stump/residual extremity    []  Transportation tie-down  []  to provide crash tested tie-down brackets    []  Crutch/cane holder    []  O2 holder    []  IV hanger   []  Ventilator tray/mount    []  stabilize accessory on wheelchair       Component  Justification     [x]  Seat cushion     Skin protection and positioning []  accommodate impaired sensation  [x]  decubitus ulcers present or history  []  unable to  shift weight  [x]  increase pressure distribution  []  prevent pelvic extension  [x]  custom required "off-the-shelf"    seat cushion will not accommodate deformity  [x]  stabilize/promote pelvis alignment  []  stabilize/promote femur alignment  []  accommodate obliquity  []  accommodate multiple deformity  []  incontinent/accidents  [x]  low maintenance     [x]  seat mounts                 []  fixed []  removable  [x]  attach seat platform/cushion to wheelchair frame    []  Seat wedge    []   provide increased aggressiveness of seat shape to decrease sliding  down in the seat  []  accommodate ROM        []  Cover replacement   []  protect back or seat cushion  []  incontinent/accidents    []  Solid seat / insert    []  support cushion to prevent      hammocking  []  allows attachment of cushion to mobility base    []  Lateral pelvic/thigh/hip     support (Guides)     []  decrease abduction  []  accommodate pelvis  []  position upper legs  []  accommodate spasticity  []  removable for transfers     []  Lateral pelvic/thigh      supports mounts  []  fixed   []  swing-away   []  removable  []  mounts lateral pelvic/thigh supports     []  mounts lateral pelvic/thigh supports swing-away or removable for transfers    []  Medial thigh support ([place])  rdecrease adduction raccommodate ROM  rremove for transfers    ralignment      []  Medial thigh   []  fixed      support mounts      []  swing-away   []  removable  []  mounts medial thigh supports   []  Mounts medial supports swing- away or removable for transfers     Component  Justification   [x]  Back    Tension adjustable   [x]  provide posterior trunk support []  facilitate tone  [x]  provide lumbar/sacral support []  accommodate deformity  [x]  support trunk in midline   []  custom required "off-the-shelf" back support will not accommodate deformity   []  provide lateral trunk support []  accommodate or decrease tone            [x]  Back mounts  []  fixed  []  removable  [x]   attach back rest/cushion to wheelchair frame   []  Lateral trunk      supports  []  R []  L  []  decrease lateral trunk leaning  []  accommodate asymmetry    []  contour for increased contact  []  safety    []  control of tone    []  Lateral trunk      supports mounts  []  fixed  []  swing-away   []  removable  []  mounts lateral trunk supports     []  Mounts lateral trunk supports swing-away or removable for transfers   []  Anterior chest      strap, vest     []  decrease forward movement of shoulder  []  decrease forward movement of trunk  []  safety/stability  []  added abdominal support  []  trunk alignment  []  assistance with shoulder control   []  decrease shoulder elevation    []  Headrest      []  provide posterior head support  []  provide posterior neck support  []  provide lateral head support  []  provide anterior head support  []  support during tilt and recline  []  improve feeding     []  improve respiration  []  placement of switches  []  safety    []  accommodate ROM   []  accommodate tone  []  improve visual orientation   []  Headrest           []  fixed []  removable []  flip down      Mounting hardware   []  swing-away laterals/switches  []  mount headrest   []  mounts headrest flip down or  removable for transfers  []  mount headrest swing-away laterals   []  mount switches     []  Neck Support    []   decrease neck rotation  []  decrease forward neck flexion   Pelvic Positioner    []  std hip belt          []  padded hip belt  []  dual pull hip belt  []  four point hip belt  []  stabilize tone  []  decrease falling out of chair  []  prevent excessive extension  []  special pull angle to control      rotation  []  pad for protection over boney      prominence  []  promote comfort    []  Essential needs        bag/pouch   []  medicines []  special food rorthotics []  clothing changes []  diapers   []  catheter/hygiene []  ostomy supplies   The above equipment has a life- long use expectancy.  Growth and  changes in medical and/or functional conditions would be the exceptions.   SUMMARY:  Why mobility device was selected; include why a lower level device is not appropriate:   ASSESSMENT:  CLINICAL IMPRESSION: Patient is a 56 y.o. male who was seen today for physical therapy wheelchair evaluation. He requires a K0005 custom manual wheelchair in order to safely and independently complete any and all of his MRADLs. His status as a non-ambulator is lifelong given his status as a bilateral above knee amputee. The patient scored a 6/47 on the Amputee Mobility Predictor Assessment Tool (AMP noPRO) indicating that he is at a K0 level and is not considered a candidate for prosthetics. Due to his status as an above knee amputee, he requires the rear wheel adjustable axle in order to improve his stability in the chair and prevent him from tipping backwards. The adjustable axle will also allow for the wheels to be placed in an ergonomically advantageous position to prevent repetitive use injuries. His impaired cardiorespiratory status requires a lighter weight wheelchair compared to a standard wheelchair in order effectively and efficiently propel himself. He has a LVAD is considered under destination therapy and is not a candidate for a heart transplant.  He will require push to lock brakes with bilateral extenders to allow him to safely lock the wheels for all transfers. He is able to safely transfer in and out of the chair independently with a slideboard. A skin protection and positioning cushion is necessary for the patient to prevent recurrence of skin break down and development of pressure injuries. While he is able to complete lateral leans and brief wheelchair pushups to reduce the pressure on his ischial tuberosities and coccyx/sacrum, he is unable to stand to completely reduce the pressure while he is using the wheelchair. A tension adjustable back will allow the patient to maintain an upright seated position  while using the chair. [place] wheels with solid inserts will provide the least maintenance and limit the resistance to rolling to allow for more efficient propulsion. Anti tippers will prevent the patient from tipping posteriorly during transfers and community obstacle negotiation. Adjustable height, flip back desk-length arm rests will allow the patient to approach various work surfaces to complete his MRADLs independently and safely. It will also allow him to remove them for safe transfers in and out of the wheelchair.  The patients need for a K0005 custom manual light weight wheelchair is a lifelong need. He is considered a K0 and not a candidate for prosthetics meaning he will be a lifelong wheelchair user. He requires the light weight due to his impaired cardiorespiratory status as a patient with an LVAD. The patients need for a wheelchair  is lifelong and will allow him to safely and independently complete any and all of his MRADLs.   OBJECTIVE IMPAIRMENTS cardiopulmonary status limiting activity, decreased mobility, and decreased strength.   ACTIVITY LIMITATIONS carrying, lifting, bending, transfers, bathing, toileting, hygiene/grooming, and caring for others  PARTICIPATION LIMITATIONS: meal prep, cleaning, laundry, driving, shopping, and community activity  PERSONAL FACTORS Past/current experiences, Time since onset of injury/illness/exacerbation, Transportation, and 3+ comorbidities: LVAD, B AKA, ESRD  are also affecting patient's functional outcome.   REHAB POTENTIAL: Fair medical complexity and time since onset  CLINICAL DECISION MAKING: Unstable/unpredictable  EVALUATION COMPLEXITY: High                                   GOALS: One time visit. No goals established.    PLAN: PT FREQUENCY: one time visit    Debbora Dus, PT Debbora Dus, PT, DPT, CBIS  05/20/2022, 3:39 PM    I concur with the above findings and recommendations of the therapist:  Physician  name printed:         Physician's signature:      Date:

## 2022-05-24 ENCOUNTER — Ambulatory Visit (HOSPITAL_COMMUNITY): Payer: Self-pay | Admitting: Pharmacist

## 2022-05-24 LAB — POCT INR: INR: 2.1 (ref 2.0–3.0)

## 2022-05-30 ENCOUNTER — Telehealth (HOSPITAL_COMMUNITY): Payer: Self-pay | Admitting: *Deleted

## 2022-05-30 MED ORDER — MIDODRINE HCL 5 MG PO TABS: 15.0000 mg | ORAL_TABLET | ORAL | 6 refills | Status: DC

## 2022-05-30 NOTE — Telephone Encounter (Signed)
Received call from pt reporting asymptomatic LOW FLOWs during HD treatment today. Currently taking Midodrine 10 mg MWF pre-HD. Discussed with Dr Aundra Dubin. Will increase Midodrine to 15 mg MWF pre-HD. May give back fluid during treatment as needed.   Discussed the above with [name]. Updated prescription sent to pt's pharmacy. He verbalized understanding.   [name] RN [place] Coordinator  Office: [phone]  24/7 Pager: [phone]

## 2022-05-31 ENCOUNTER — Ambulatory Visit (HOSPITAL_COMMUNITY): Payer: Self-pay | Admitting: Pharmacist

## 2022-05-31 LAB — POCT INR: INR: 2.6 (ref 2.0–3.0)

## 2022-06-07 ENCOUNTER — Ambulatory Visit (HOSPITAL_COMMUNITY): Payer: Self-pay | Admitting: Pharmacist

## 2022-06-07 LAB — POCT INR: INR: 2.1 (ref 2.0–3.0)

## 2022-06-10 ENCOUNTER — Ambulatory Visit (INDEPENDENT_AMBULATORY_CARE_PROVIDER_SITE_OTHER): Payer: Medicare Other

## 2022-06-10 DIAGNOSIS — I255 Ischemic cardiomyopathy: Secondary | ICD-10-CM | POA: Diagnosis not present

## 2022-06-11 ENCOUNTER — Telehealth (HOSPITAL_COMMUNITY): Payer: Self-pay | Admitting: *Deleted

## 2022-06-11 NOTE — Telephone Encounter (Signed)
Received page from patient at 6pm reporting he was shocked by his device around 10am this morning. At the time he was experiencing some chest discomfort. Reports he was at his grandson's funeral, and has been under a lot of stress over the last week. Denies low flow alarm at time of shock. VAD #s stable currently. Denies chest discomfort now; states he feels at baseline. Instructed patient to send a remote transmission.   Discussed with Dr Tretha Sciara. Will plan to bring patient to clinic on Monday for assessment after his HD treatment. Will have HD collect labs at next treatment as pt is a difficult stick. If pt has recurrent chest pain, or shock, he is to report to the ER.   Spoke with [name] about the above plan. He verbalized understanding of need to notify VAD coordinator immediately if he has recurrent chest pain or receives another shock. Will plan to see pt in VAD clinic 06/13/22 at 11:00 following HD treatment.   [name] RN [place] Coordinator  Office: [phone]  24/7 Pager: [phone] '

## 2022-06-13 ENCOUNTER — Ambulatory Visit (HOSPITAL_COMMUNITY)
Admission: RE | Admit: 2022-06-13 | Discharge: 2022-06-13 | Disposition: A | Discharge status: Home / Self Care | Payer: Medicare Other | Source: Ambulatory Visit | Attending: Cardiology | Admitting: Cardiology

## 2022-06-13 ENCOUNTER — Other Ambulatory Visit: Payer: Self-pay | Admitting: Infectious Diseases

## 2022-06-13 ENCOUNTER — Encounter (HOSPITAL_COMMUNITY): Payer: Self-pay

## 2022-06-13 ENCOUNTER — Telehealth: Payer: Self-pay

## 2022-06-13 VITALS — BP 110/51 | HR 84

## 2022-06-13 DIAGNOSIS — I2510 Atherosclerotic heart disease of native coronary artery without angina pectoris: Secondary | ICD-10-CM | POA: Insufficient documentation

## 2022-06-13 DIAGNOSIS — Z95811 Presence of heart assist device: Secondary | ICD-10-CM

## 2022-06-13 DIAGNOSIS — Z89612 Acquired absence of left leg above knee: Secondary | ICD-10-CM | POA: Insufficient documentation

## 2022-06-13 DIAGNOSIS — I472 Ventricular tachycardia, unspecified: Secondary | ICD-10-CM

## 2022-06-13 DIAGNOSIS — Z955 Presence of coronary angioplasty implant and graft: Secondary | ICD-10-CM | POA: Diagnosis not present

## 2022-06-13 DIAGNOSIS — I255 Ischemic cardiomyopathy: Secondary | ICD-10-CM | POA: Insufficient documentation

## 2022-06-13 DIAGNOSIS — Z89611 Acquired absence of right leg above knee: Secondary | ICD-10-CM | POA: Insufficient documentation

## 2022-06-13 DIAGNOSIS — G8918 Other acute postprocedural pain: Secondary | ICD-10-CM | POA: Diagnosis not present

## 2022-06-13 DIAGNOSIS — N186 End stage renal disease: Secondary | ICD-10-CM | POA: Insufficient documentation

## 2022-06-13 DIAGNOSIS — I132 Hypertensive heart and chronic kidney disease with heart failure and with stage 5 chronic kidney disease, or end stage renal disease: Secondary | ICD-10-CM | POA: Insufficient documentation

## 2022-06-13 DIAGNOSIS — E785 Hyperlipidemia, unspecified: Secondary | ICD-10-CM | POA: Insufficient documentation

## 2022-06-13 DIAGNOSIS — K3184 Gastroparesis: Secondary | ICD-10-CM | POA: Insufficient documentation

## 2022-06-13 DIAGNOSIS — K219 Gastro-esophageal reflux disease without esophagitis: Secondary | ICD-10-CM | POA: Diagnosis not present

## 2022-06-13 DIAGNOSIS — E1122 Type 2 diabetes mellitus with diabetic chronic kidney disease: Secondary | ICD-10-CM | POA: Diagnosis not present

## 2022-06-13 DIAGNOSIS — I4892 Unspecified atrial flutter: Secondary | ICD-10-CM | POA: Insufficient documentation

## 2022-06-13 DIAGNOSIS — Z79899 Other long term (current) drug therapy: Secondary | ICD-10-CM

## 2022-06-13 DIAGNOSIS — K5090 Crohn's disease, unspecified, without complications: Secondary | ICD-10-CM | POA: Diagnosis not present

## 2022-06-13 DIAGNOSIS — I5022 Chronic systolic (congestive) heart failure: Secondary | ICD-10-CM | POA: Insufficient documentation

## 2022-06-13 DIAGNOSIS — E1143 Type 2 diabetes mellitus with diabetic autonomic (poly)neuropathy: Secondary | ICD-10-CM | POA: Diagnosis not present

## 2022-06-13 DIAGNOSIS — I252 Old myocardial infarction: Secondary | ICD-10-CM | POA: Diagnosis not present

## 2022-06-13 DIAGNOSIS — Z9049 Acquired absence of other specified parts of digestive tract: Secondary | ICD-10-CM | POA: Diagnosis not present

## 2022-06-13 LAB — CUP PACEART REMOTE DEVICE CHECK
Battery Remaining Longevity: 42 mo
Battery Remaining Percentage: 45 %
Battery Voltage: 2.92 V
Brady Statistic AP VP Percent: 1 %
Brady Statistic AP VS Percent: 1.9 %
Brady Statistic AS VP Percent: 1 %
Brady Statistic AS VS Percent: 97 %
Brady Statistic RA Percent Paced: 1 %
Brady Statistic RV Percent Paced: 1 %
Date Time Interrogation Session: 20231007180317
HighPow Impedance: 50 Ohm
HighPow Impedance: 62 Ohm
Implantable Lead Implant Date: 20100430
Implantable Lead Implant Date: 20100430
Implantable Lead Location: 753859
Implantable Lead Location: 753860
Implantable Lead Model: 7121
Implantable Pulse Generator Implant Date: 20180110
Lead Channel Impedance Value: 360 Ohm
Lead Channel Impedance Value: 740 Ohm
Lead Channel Pacing Threshold Amplitude: 1 V
Lead Channel Pacing Threshold Amplitude: 1.25 V
Lead Channel Pacing Threshold Pulse Width: 0.5 ms
Lead Channel Pacing Threshold Pulse Width: 0.5 ms
Lead Channel Sensing Intrinsic Amplitude: 1.1 mV
Lead Channel Sensing Intrinsic Amplitude: 12 mV
Lead Channel Setting Pacing Amplitude: 2.5 V
Lead Channel Setting Pacing Amplitude: 2.5 V
Lead Channel Setting Pacing Pulse Width: 0.5 ms
Lead Channel Setting Sensing Sensitivity: 0.5 mV
Pulse Gen Serial Number: 7383809

## 2022-06-13 MED ORDER — CEFADROXIL 500 MG PO CAPS: 1000.0000 mg | ORAL_CAPSULE | ORAL | 11 refills | Status: DC

## 2022-06-13 MED ORDER — AMIODARONE HCL 200 MG PO TABS: 200.0000 mg | ORAL_TABLET | Freq: Every day | ORAL | 3 refills | Status: DC

## 2022-06-13 NOTE — Patient Instructions (Addendum)
Increase Amiodarone to 200 mg twice a day for 10 days, then resume 200 mg daily Restart Cefadroxil 1000 mg (2 tablets) on the evening of HD Coumadin dosing per Lauren PharmD Return to [facility] clinic for previously scheduled appointment

## 2022-06-13 NOTE — Progress Notes (Addendum)
Patient presents for sick visit following ICD shock x 1 over the weekend with his son. Denies issues with VAD equipment.   Denies lightheadedness, dizziness, falls, shortness of breath, chest pain, and signs of bleeding. [name] transferred him from wheelchair to stretcher today. Reports good PO intake. PRN Oxy provided by pain clinic for back and groin pain- pain well managed.   Pt paged VAD coordinator over the weekend reporting he was shocked x 1. At time of defibrillation he was experiencing some chest pain "behind my ICD". Chest pain resolved after defib. Denied VAD alarms prior to shock. Reports he was under significant stress as his 60 month old grandson had passed away, and they were getting ready for his funeral. Remote transmission sent on Saturday. Reviewed today- appropriately shocked for VF rate 375 @ 10:55 am on 10/7. Recurrent VT rate 190 (monitored) for 2 min 24 sec @ 3:35 pm on 10/7. Per Dr Aundra Dubin will increased Amiodarone to 200 mg BID for 10 days, then resume 200 mg daily. Updated prescription sent to pt's pharmacy. Pt and son verbalized understanding of medication instructions. Lauren PharmD aware of Amiodarone change.   EKG obtained and reviewed by Dr Aundra Dubin.   Exit site with persistent purulent drainage. Completed extra 3 weeks of Cefadroxil after last visit. Exit site healing and incorporated, the velour is fully implanted at exit site. No signs of tunneling. No redness, mild tenderness with cleansing at site. No foul odor or rash noted. Discussed with [name] NP with Infectious Disease and suggested to continue Cefadroxil 1061m 3x a week post dialysis as pt has had multiple incidents of recurrent drainage after stopping antibiotics. Prescription sent to pt's pharmacy. Pt verbalized of need for antibiotic. Lauren PharmD aware of restarting antibiotic.    Attempted to obtain labs in clinic today- unsuccessful x 2. Lab orders for LFTs, Mg, TSH, T4, LDH, and high sensitivity  troponin [facility] with treatment on Wednesday. Per CCaren GriffinsRN at dialysis center, they are unable to collect troponin. Dr MAundra Dubinaware.   Vital Signs:  Doppler Pressure 90 Automatc BP: 110/51 (72) HR: 84 SR SPO2: UTO % on RA   Weight: UTO lb w/o eqt Last weight: 167 lb pt reported from dialysis center (dry wt: 75 kg)   VAD Indication: Destination therapy- Pt is no longer candidate for heart/kidney transplant due to PAD per [facility].    VAD interrogation & Equipment Management: Speed: 5600 Flow: 3.6 Power: 4.4 w    PI: 2.2   Alarms: 1 low flow today at HD (low flows have improved with recent Midodrine increase to 15 mg on HD days) Events: 100+ on dialysis days  Fixed speed: 5600 Low speed limit: 5300   Primary Controller: Replace back up battery in 14 months Back up controller:   Replace back up battery in 18 months   Annual Equipment Maintenance on UBC/PM was performed on 03/30/21.    I reviewed the LVAD parameters from today and compared the results to the patient's prior recorded data. LVAD interrogation was NEGATIVE for significant power changes, NEGATIVE for clinical alarms and STABLE for PI events/speed drops. No programming changes were made and pump is functioning within specified parameters. Pt is performing daily controller and system monitor self tests along with completing weekly and monthly maintenance for LVAD equipment.   LVAD equipment check completed and is in good working order. Back-up equipment present.   Exit Site Care: Drive line is being maintained twice a week by  daughter. He is not wearing an anchor- refuses to wear. Existing VAD dressing removed and site care performed using sterile technique. Drive line exit site cleaned with Chlora prep applicators x 2, allowed to dry, and silver strip applied. Exit site healing and incorporated, the velour is fully implanted at exit site. No signs of tunneling. No redness, mild tenderness with  cleansing at site. No foul odor or rash noted. Drive line anchor re-applied and education provided on the importance of using an anchor to prevent further trauma. Pt denies fever or chills. Provided patient with 7 daily dressing kits for home use.    Significant Events on VAD Support:  -10/2016>> poss drive line infection, CT ABD neg, ID consult-doxy -11/09/16>> admit for poss drive line infection, IV abx -03/24/17>> doxy for poss drive line infection -05/04/17>> drive line debridement with wound-vac -06/2017>> drive line +proteus, IV abx, Bactrim x14 days  Device: St jude Dual Therapies: on 200 bpm Last check: 03/10/22 - One long NSVT arrhythmia detected   BP & Labs: Doppler BP 84    Hgb not drawn today- difficult venous access. Result from dialysis labs pending - No S/S of bleeding. Specifically denies melena/BRBPR or nosebleeds.   LDH not drawn today-difficult venous access. Results from dialysis labs pending - Denies tea-colored urine. No power elevations noted on interrogation.     Patient Instructions:  Increase Amiodarone to 200 mg twice a day for 10 days, then resume 200 mg daily Restart Cefadroxil 1000 mg (2 tablets) in the evening after HD Coumadin dosing per Ander Purpura PharmD Return to [place] [facility] for previously scheduled appointment  [name] RN [place] Coordinator  Office: [phone]  24/7 Pager: [phone]   Cardiology: Dr. Aundra Dubin  HPI: 56 y.o. with history of CAD s/p anterior MI in 2010 and NSTEMI in 3/18 with DES to pRCA and mLAD and ischemic cardiomyopathy now s/p Heartmate 3 LVAD and ESRD presents for followup of CHF/LVAD.  Patient developed low output heart failure.  He was not deemed to be a good transplant candidate.  He was admitted in 10/18 and Heartmate 3 LVAD was placed.  He had baseline CKD stage 3 likely from diabetic nephropathy and CHF.  He developed peri-op AKI and ended up on dialysis.  No renal recovery to date.  St Jude ICD has been turned back on and  ramp echo was done prior to discharge in 10/18, speed increased to 5600 rpm.   He was admitted in 11/18 with nausea/vomiting and abdominal pain.  This appears to have been due to diabetic gastroparesis (extensive GI evaluation). He had atrial flutter this admission that was converted to NSR with amiodarone.  He improved considerably with scheduled Reglan and erythromycin. He was re-admitted later in 11/18 with the same symptoms, suspect diabetic gastroparesis and was again treated with erythromycin.  He was not able to start domperidone due to prolonged QT interval. He had nausea again about a week ago but was able to manage at home with Phenergan suppositories.   Admitted 1/8 -> 09/16/17 for uncontrolled vomiting in setting of diabetic gastroparesis. Improved with phenergan suppositories and given erythromycin TID for 7 days post-discharge.    He was admitted in 2/19 for AV graft placement.  While in the hospital, we turned down his speed.  Since then, he has had considerably fewer PI events on HD days.    He was admitted in 4/19 with nausea/vomiting again, probably gastroparesis flare.   He was admitted in 5/19 with nausea/vomiting, likely gastroparesis flare, treated  with erythromycin course which he is finishing.   He has been evaluated at [facility] by Dr. Derrill Kay (gastroparesis specialist).   Gastric emptying study, surprisingly, was near normal. However, electrogastrogram showed poor gastric accomodation/capacity, "bradygastria."  Frequent small meals were recommended. Also, early am nausea was thought to be possible GERD.    Admitted 6/26 - 03/19/18 for emergent VAD exchange 03/01/18 in the setting of HM3 outflow graft kink. He required CVVHD post op, but was transtioned back to [facility] without problems. Course complicated by leukocytosis and fever. CT abd/chest negative for infection and blood cultures were negative. Post op pain well controlled with rare tramadol. Able to tolerate diet. Evaluated by  PT/OT and HH PT recommended for discharge. Ramp echo completed prior to discharge with speed optimization. Resumed 81 mg ASA + coumadin with INR goal 2-3.   He was admitted 03/23/18 with nausea/gastroparesis symptoms and marked HTN at outpatient HD.  Erythromycin was restarted and gastroparesis symptoms were controlled with fall in BP and discharge on 03/25/18.   He was admitted in 9/19 for LUE AV fistula placement.  He was readmitted in 10/19 with his typical gastroparesis symptoms, unable to take po. The fistula later failed and was replaced with a graft.  However, the graft was nonfunctional and developed a hematoma at the surgical site.    He was admitted in 12/19 with nausea/vomiting from HD. Also with LDH increased to 427 in setting of extensive hematoma around his left arm AV graft.  He has had tingling in his left hand and left grip is weak.    In 7/20, he was admitted with ischemic 4th toe on right foot.  Peripheral angiogram 03/18/19 showed 80% right SFA stenosis and occluded right PT.  He had stenting of SFA and PTCA of PT with restoration of flow.  However, he still needed amputation right 4th toe on 7/16 with intractable pain.  He was readmitted with pain in his right foot, nausea, and vomiting. There was concern for ongoing right foot ischemia with all toes and a portion of the foot now dusky.  He was treated with IV vancomycin for possible osteomyelitis/infection, but thought that more likely ischemia could explain all the findings.  He was discharged home to followup with vascular surgery.   He was re-admitted in 8/20 with ongoing right foot pain and ended up having transmetatarsal amputation of the right foot.  Left leg angiogram was done with DES to left SFA, only 1 vessel runoff via severely diseased peroneal artery, unable to intervene.   He was readmitted in 9/20 with ongoing right foot/lower leg pain and had right AKA.  He was discharged to [facility].    He was then admitted with  gangrene in the remnant of his left foot in 11/20.  He had left AKA.    Patient was in [place] in 11/22 and fractured a metacarpal in his right hand.    Patient had episode of VT in 2/23 with ICD shock.  He was started on amiodarone.  Patient returns for followup of CHF/LVAD.  He has been under a lot of stress recently, his 57-year-old grandson died in his sleep.  Patient had had episodes of mild chest pain localized behind his ICD on and off for several days.  This was not exertional.  He has been undergoing dialysis as usual without problems.  He has been taking his amiodarone.  On Saturday, while getting ready for his grandson's funeral, he had an ICD discharge.  Interrogation showed  VF converted back to NSR. No repetition since that time.   Reports taking Coumadin as prescribed and adherence to anticoagulation based dietary restrictions. Denies BRBPR or melena. No dark urine or hematuria.   ECG (personally reviewed): NSR, right axis deviation, septal Qs  Labs (11/18): hgb 10.4 => 8.7, LDH 213 Labs (08/04/17): hgb 9 Labs (1/19): HgbA1c 6.1 Labs (2/19): hgb 13.2 Labs (3/19): hgb 11.1 Labs (7/19): hgb 9.6 Labs (10/19: hgb 11.8 Labs (12/19): LDH 427 => 237, hgb 7.7 Labs (11/20): hgb 8.1 Labs (1/21): HIV negative, RPR negative Labs (3/21): hgb 8.8 Labs (6/21): hgb 12.4 Labs (8/21): hgb 10.8, LDH 201 Labs (10/21): hgb 12.4, LDH 220 Labs (12/21): hgb 12, LDH 228 Labs (3/22): hgb 11.7 Labs (5/22): hgb 10.7, LDH 180 Labs (11/22): hgb 10.4 Labs (1/23): hgb 10.1 Labs (4/23): T4 normal, TSH mildly elevated 5.4 Labs (5/23): LDL 187, hgb 10.8  PMH: 1. Chronic systolic CHF: Ischemic cardiomyopathy.  St Jude ICD.  - Echo (11/17) with EF 25-30%, moderate diastolic dysfunction, mild MR, mildly dilated RV with moderately decreased systolic function, peak RV-RA gradient 48 mmHg.  - Echo (3/18) with EF 25-30%, wall motion abnormalities, normal RV size and systolic function.  - Admission 3/18 with  cardiogenic shock and AKI, required milrinone gtt.  - Echo (8/18) with EF 20-25%, mild LVH, moderate LV dilation, mild MR.  - CPX (8/18) with peak VO2 12.4, VE/VCO2 slope 35, RER 1.05 => severe HR limitation.  - Heartmate 3 LVAD 10/18.  - Pump thrombosis in setting of HM3 outflow graft kink.  Pump exchange 6/19 for HM3.  2. CAD: Anterior MI 2005, had bifurcation stenting LAD/diagonal.  Repeat cath 2010 with patent stents.  - NSTEMI 3/18: LHC with 99% ulcerated lesion proximal RCA, 95% mid LAD => PCI with DES to proximal RCA and mid LAD.  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  - Lower extremity venous dopplers (8/18): chronic DVT - V/Q scan (8/18): Normal, no PE.  4. Crohns disease: History of colectomy.  5. GERD 6. Diabetic gastroparesis: Admission 11/18 with abdominal pain, nausea/vomiting.  - [facility] workup 2019: Gastric emptying study near normal.  Negative breath test (no evidence for small bowel bacterial overgrowth).  Electrogastrogram showed poor gastric accomodation/capacity, "bradygastria."  7. HTN 8. Hyperlipidemia 9. Type II diabetes  10. ESRD: Post-op LVAD surgery.  He has had failed AV fistula and AV graft, now dialyzing via catheter . 11. Carotid dopplers (8/18): mild bilateral stenosis.  12. PFTs (8/18): Mild restriction (no IDL on CT chest).  13. Lung nodules: CT chest 8/18 with small bilateral nodules.  14. PAD: ABIs with moderate bilateral disease in 8/18.  - Peripheral angiogram 03/18/19 showed 80% right SFA stenosis and occluded right PT.  He had stenting of SFA and PTCA of PT with restoration of flow.  However, he still needed amputation right 4th toe on 03/21/19 with intractable pain.  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  - Peripheral angiogram left leg: DES to left SFA, only 1 vessel runoff via severely diseased peroneal artery, unable to intervene. 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  - Left AKA 11/20.  15. Atrial flutter: In 11/18 associated with nausea/vomiting from gastroparesis.  16.  VT: VT with ICD shock in 11/22.  - VT with ICD shock in 2/23, amiodarone started.  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 90 tablet 3  atorvastatin (LIPITOR) 40 MG tablet Take 1 tablet (40 mg total) by mouth daily at 6 PM. 90 tablet 3   calcitRIOL (ROCALTROL) 0.5 MCG capsule Take 1 capsule (0.5 mcg total) by mouth every Monday, Wednesday, and Friday. 15 capsule 5   citalopram (CELEXA) 20 MG tablet Take 1 tablet (20 mg total) by mouth daily. 30 tablet 6   hydrALAZINE (APRESOLINE) 25 MG tablet Take 1 tablet three times a day on NON-dialysis days only 270 tablet 3   insulin glargine (LANTUS) 100 UNIT/ML injection Inject 0.3 mLs (30 Units total) into the skin daily. 10 mL 2   metoCLOPramide (REGLAN) 10 MG tablet Take 1 tablet (10 mg total) by mouth 3 (three) times daily. (Patient taking differently: Take 10 mg by mouth 3 (three) times daily before meals.) 90 tablet 6   midodrine (PROAMATINE) 5 MG tablet Take 3 tablets (15 mg total) by mouth every Monday, Wednesday, and Friday. Take 1 tablet before dialysis treatment 70 tablet 6   multivitamin (RENA-VIT) TABS tablet Take 1 tablet by mouth daily.     oxyCODONE (ROXICODONE) 5 MG immediate release tablet Take 1 tablet (5 mg total) by mouth 2 (two) times daily as needed for severe pain. Take 1-2 tablets every 4-6 hours for severe pain. 30 tablet 0   oxyCODONE-acetaminophen (PERCOCET) 7.5-325 MG tablet Take 1 tablet by mouth 3 (three) times daily as needed for pain.     pantoprazole (PROTONIX) 40 MG tablet Take 1 tablet (40 mg total) by mouth 2 (two) times daily before lunch and supper. 60 tablet 11   sevelamer carbonate (RENVELA) 0.8 g PACK packet Take 0.8 g by mouth 3 (three) times daily with meals. 270 each 1   warfarin (COUMADIN) 5 MG tablet TAKE 1 TABLET BY MOUTH EVERY DAY 30 tablet 11   amiodarone  (PACERONE) 200 MG tablet Take 1 tablet (200 mg total) by mouth daily. Take 200 mg twice a day for 10 days (starting 06/13/22), then resume 200 mg daily. 90 tablet 3   cefadroxil (DURICEF) 500 MG capsule Take 2 capsules (1,000 mg total) by mouth 3 (three) times a week. Take AFTER dialysis sessions 24 capsule 11   LORazepam (ATIVAN) 0.5 MG tablet Take 1 tablet (0.5 mg total) by mouth every 12 (twelve) hours as needed (nausea). (Patient not taking: Reported on 06/13/2022) 60 tablet 0   nitroGLYCERIN (NITROSTAT) 0.4 MG SL tablet Place 1 tablet (0.4 mg total) under the tongue every 5 (five) minutes as needed for chest pain. 90 tablet 3   ondansetron (ZOFRAN-ODT) 4 MG disintegrating tablet Take by mouth as needed. (Patient not taking: Reported on 06/13/2022)     promethazine (PHENERGAN) 12.5 MG tablet TAKE 1 TABLET (12.5 MG TOTAL) BY MOUTH EVERY 6 (SIX) HOURS AS NEEDED FOR NAUSEA OR VOMITING. (Patient not taking: Reported on 06/13/2022) 30 tablet 3   No current facility-administered medications for this encounter.    Metformin and related   Review of systems complete and found to be negative unless listed in HPI.    BP (!) 110/51 Comment: map 72  Pulse 84     VAD interrogation & Equipment Management: See LVAD nurse's note above.   General: Well appearing this am. NAD.  HEENT: Normal. Neck: Supple, JVP 7-8 cm. Carotids OK.  Cardiac:  Mechanical heart sounds with LVAD hum present.  Lungs:  CTAB, normal effort.  Abdomen:  NT, ND, no HSM. No bruits or masses. +BS  LVAD exit site: Well-healed and incorporated. Dressing dry and intact.  No erythema or drainage. Stabilization device present and accurately applied. Driveline dressing changed daily per sterile technique. Extremities:  Warm and dry. No cyanosis, clubbing, rash, or edema. Bilateral AKAs.  Neuro:  Alert & oriented x 3. Cranial nerves grossly intact. Moves all 4 extremities w/o difficulty. Affect pleasant    ASSESSMENT AND PLAN: 1.  PAD/ischemic feet:  He is now s/p bilateral AKAs.  Generally able to wheel his chair and do transfers without dyspnea.  2. Chronic systolic CHF: Ischemic cardiomyopathy.  St Jude ICD.  NSTEMI in March 2018 with DES to LAD and RCA, complicated by cardiogenic shock, low output requiring milrinone.  Echo 8/18 with EF 20-25%, moderate MR. CPX (8/18) with severe functional impairment due to HF.  He is s/p HM3 LVAD placement. He had pump thrombosis 6/19 due to [facility] outflow graft kinking.  He had pump exchange for another HM3.  LVAD parameters stable, no recent low flows with HD. He takes midodrine pre-HD, but needs hydralazine on non-HD days.     - Continue pre-HD midodrine 10 mg.   - Volume managed at dialysis, MWF.   - Continue ASA 81 and warfarin with INR goal 2-2.5.    - Continue hydralazine 25 mg tid on non-HD days. - He is not candidate for transplant now given extensive PAD with amputations.   3. ESRD: HD on M-W-F with midodrine pre-HD.  - Need to allow UF as long as MAP > 70 (would not stop for MAP 70-80).   4.  CAD: NSTEMI in 3/18.  LHC with 99% ulcerated lesion proximal RCA with left to right collaterals, 95% mid LAD stenosis after mid LAD stent.  s/p PCI to RCA and LAD on 11/25/16. He had atypical chest pain up until Friday when he had ICD discharge, none since then.  - Continue statin and ASA.  5. h/o DVTs: Factor V Leiden heterozygote. - Anticoagulated. 6. Diabetic gastroparesis: He has seen gastroparesis specialist at [facility]. Surprisingly, gastric emptying study was normal. However, electrogastrogram showed poor gastric accomodation/capacity. Therefore, small frequent meals recommended.  He has been doing much better with this, minimal symptoms.  - Continue reglan 10 mg tid/ac, now off Marinol.  - Continue Protonix bid.  - Limit narcotic use as much as possible.  7. HTN: Takes hydralazine 25 tid on non-HD days.  8. Chronic pain: Now seen in a pain clinic.  9. Atrial flutter: Paroxysmal.   Not seen recently.  10. VT: He was started on amiodarone after 2/23 ICD shock. He had VF on Saturday terminated by shock.  He had been under a lot of stress.  - Increase amiodarone to 200 mg bid x 1 week then back to daily. Follow LFTs and TSH.  He will need a regular eye exam.   - Check BMET, Mg.  Unable to draw his blood today, will have it done at HD.  - he does not drive.  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  - Will continue cefadroxil long-term (three days/week after HD).  This will be followed by ID.   Followup in 1 month  Taylon Coole 06/13/2022

## 2022-06-13 NOTE — Telephone Encounter (Signed)
Scheduled remote reviewed. Normal device function.  1 VF event, rate 375 converted with 30J to regular AS/VP. 1 monitored VT, rate 190, duration 47mn 24sec. 2 additional NSVT, all events occurred 10/7 - route to triage high priority per protocol. Next remote 91 days.  Patient was seen by Dr. MAundra Dubintoday due to recent shock. Patient reports he recently lost a grandchild recently and attributed shock to stress. Offered my thoughts and prayers to patient and family. Reports of recent chest pain although has improved. Compliant with medications.   Per Note today ~ copied from previous apt. Increase Amiodarone to 200 mg twice a day for 10 days, then resume 200 mg daily Restart Cefadroxil 1000 mg (2 tablets) in the evening after HD Coumadin dosing per LAnder PurpuraPharmD [facility]

## 2022-06-14 ENCOUNTER — Ambulatory Visit (HOSPITAL_COMMUNITY): Payer: Self-pay | Admitting: Pharmacist

## 2022-06-14 LAB — POCT INR: INR: 2.4 (ref 2.0–3.0)

## 2022-06-14 NOTE — Progress Notes (Signed)
Remote ICD transmission.   

## 2022-06-21 ENCOUNTER — Ambulatory Visit (HOSPITAL_COMMUNITY): Payer: Self-pay | Admitting: Pharmacist

## 2022-06-21 LAB — POCT INR: INR: 2.1 (ref 2.0–3.0)

## 2022-06-28 ENCOUNTER — Ambulatory Visit (INDEPENDENT_AMBULATORY_CARE_PROVIDER_SITE_OTHER): Payer: Medicare Other

## 2022-06-28 ENCOUNTER — Encounter: Payer: Self-pay | Admitting: Infectious Diseases

## 2022-06-28 ENCOUNTER — Ambulatory Visit: Payer: Medicare Other | Admitting: Infectious Diseases

## 2022-06-28 ENCOUNTER — Ambulatory Visit (HOSPITAL_COMMUNITY)
Admission: RE | Admit: 2022-06-28 | Discharge: 2022-06-28 | Disposition: A | Discharge status: Home / Self Care | Payer: Medicare Other | Source: Ambulatory Visit | Attending: Cardiology | Admitting: Cardiology

## 2022-06-28 ENCOUNTER — Other Ambulatory Visit: Payer: Self-pay

## 2022-06-28 VITALS — Temp 97.7°F

## 2022-06-28 VITALS — BP 118/0 | HR 77 | Ht 68.0 in | Wt 165.3 lb

## 2022-06-28 DIAGNOSIS — N186 End stage renal disease: Secondary | ICD-10-CM | POA: Insufficient documentation

## 2022-06-28 DIAGNOSIS — K3184 Gastroparesis: Secondary | ICD-10-CM | POA: Diagnosis not present

## 2022-06-28 DIAGNOSIS — Z7901 Long term (current) use of anticoagulants: Secondary | ICD-10-CM | POA: Insufficient documentation

## 2022-06-28 DIAGNOSIS — Z89611 Acquired absence of right leg above knee: Secondary | ICD-10-CM | POA: Diagnosis not present

## 2022-06-28 DIAGNOSIS — Z89612 Acquired absence of left leg above knee: Secondary | ICD-10-CM | POA: Insufficient documentation

## 2022-06-28 DIAGNOSIS — Z992 Dependence on renal dialysis: Secondary | ICD-10-CM | POA: Diagnosis not present

## 2022-06-28 DIAGNOSIS — I255 Ischemic cardiomyopathy: Secondary | ICD-10-CM | POA: Diagnosis not present

## 2022-06-28 DIAGNOSIS — I132 Hypertensive heart and chronic kidney disease with heart failure and with stage 5 chronic kidney disease, or end stage renal disease: Secondary | ICD-10-CM | POA: Insufficient documentation

## 2022-06-28 DIAGNOSIS — T827XXA Infection and inflammatory reaction due to other cardiac and vascular devices, implants and grafts, initial encounter: Secondary | ICD-10-CM | POA: Insufficient documentation

## 2022-06-28 DIAGNOSIS — Z79899 Other long term (current) drug therapy: Secondary | ICD-10-CM | POA: Insufficient documentation

## 2022-06-28 DIAGNOSIS — I4892 Unspecified atrial flutter: Secondary | ICD-10-CM | POA: Diagnosis not present

## 2022-06-28 DIAGNOSIS — Z7982 Long term (current) use of aspirin: Secondary | ICD-10-CM | POA: Insufficient documentation

## 2022-06-28 DIAGNOSIS — Z95811 Presence of heart assist device: Secondary | ICD-10-CM | POA: Diagnosis not present

## 2022-06-28 DIAGNOSIS — I252 Old myocardial infarction: Secondary | ICD-10-CM | POA: Diagnosis not present

## 2022-06-28 DIAGNOSIS — E1122 Type 2 diabetes mellitus with diabetic chronic kidney disease: Secondary | ICD-10-CM | POA: Diagnosis not present

## 2022-06-28 DIAGNOSIS — I5022 Chronic systolic (congestive) heart failure: Secondary | ICD-10-CM | POA: Insufficient documentation

## 2022-06-28 DIAGNOSIS — I2510 Atherosclerotic heart disease of native coronary artery without angina pectoris: Secondary | ICD-10-CM | POA: Insufficient documentation

## 2022-06-28 DIAGNOSIS — D6851 Activated protein C resistance: Secondary | ICD-10-CM | POA: Insufficient documentation

## 2022-06-28 DIAGNOSIS — Y828 Other medical devices associated with adverse incidents: Secondary | ICD-10-CM | POA: Insufficient documentation

## 2022-06-28 DIAGNOSIS — I472 Ventricular tachycardia, unspecified: Secondary | ICD-10-CM | POA: Diagnosis not present

## 2022-06-28 DIAGNOSIS — Z4801 Encounter for change or removal of surgical wound dressing: Secondary | ICD-10-CM | POA: Insufficient documentation

## 2022-06-28 DIAGNOSIS — Z23 Encounter for immunization: Secondary | ICD-10-CM | POA: Diagnosis not present

## 2022-06-28 DIAGNOSIS — Z794 Long term (current) use of insulin: Secondary | ICD-10-CM | POA: Diagnosis not present

## 2022-06-28 DIAGNOSIS — G8929 Other chronic pain: Secondary | ICD-10-CM | POA: Insufficient documentation

## 2022-06-28 NOTE — Assessment & Plan Note (Addendum)
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  He has seen Dr. Prescott Gum recently given recurrent episodes and no surgical indications for debridement at this time given no tunneling. We discussed that this is an attempt to reduce ascending infection; in reviewing literature despite chronic antibiotic suppression relapse rate can still be about 30% despite efforts.  Explained he has two high risk factors with T2DM and ESRD that contribute to infection risk and delay in wound healing.    He would like to continue the 3x weekly dosing for suppression given it has been quite favorable for the site drainage and decreasing pain around the driveline.   Will schedule a follow up via telehealth in 6 months. Encouraged him to reach out to me in the mean time if he has trouble tolerating the antibiotics or any concerns over the driveline site.

## 2022-06-28 NOTE — Patient Instructions (Signed)
No change in medications  Return to clinic in 2 months

## 2022-06-28 NOTE — Progress Notes (Signed)
Patient: [name]  DOB: 1966-01-09 MRN: [id number] PCP: Precious Gilding, DO     Patient Active Problem List   Diagnosis Date Noted   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[place]) 12/29/2021   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[place]) 09/24/2020   Penile ulcer 09/19/2019   PAD (peripheral artery disease) ([place]) 07/23/2019   Warfarin anticoagulation 04/15/2019   Heart failure ([place]) 05/23/2018   Benign essential HTN    Crohn's disease with complication ([place])    Diabetes mellitus type 2 in nonobese ([place])    Anemia of chronic disease    Sinus tachycardia    Factor V Leiden carrier ([place]) 01/10/2018   History of creation of ostomy ([place]) 01/10/2018   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) ([place]) 63/09/6008   Chronic systolic CHF (congestive heart failure) ([place]) 06/07/2017   DNR (do not resuscitate) discussion    CHF exacerbation ([place]) 04/05/2017   Diabetic keto-acidosis ([place]) 04/05/2017   ESRD on dialysis ([place]) 01/17/2017   Non-ST elevation (NSTEMI) myocardial infarction [facility])    CHF (congestive heart failure) ([place]) 09/02/2016   SVT (supraventricular tachycardia) 04/26/2012   Gastroparesis 11/18/2011   Coronary artery disease    Ischemic cardiomyopathy    Essential hypertension    Automatic implantable cardioverter-defibrillator in situ    POLYP, COLON 09/25/2008   DIVERTICULOSIS, COLON 09/25/2008   History of Crohn's disease 09/05/2006     Subjective:  [name] is a 56 y.o. 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  [name] is joined by his daughter [name] today.  He tells me he is doing very well on the suppressive cefadroxil 1 g after HD sessions 3 times a week.  He is on every other day dressing changes now with very scant drainage noted in between.  He is hopeful that he will be able to progress to weekly dressing kits  again.  He is tolerating the antibiotics without any known side effects and they do not bother him a bit. He wonders why he has had such a hard time with these infections and healing up of the wound.  He has seen Dr. Darcey Nora and there is 0 tunneling present. He denies fevers or chills or any concerns over a local skin infection at the exit site.  Review of Systems  Constitutional:  Negative for chills and fever.  Gastrointestinal:  Negative for abdominal pain, nausea and vomiting.  All other systems reviewed and are negative.   Past Medical History:  Diagnosis Date   Angina    ASCVD (arteriosclerotic cardiovascular disease)    , Anterior infarction 2005, LAD diagonal bifurcation intervention 03/2004   Automatic implantable cardiac defibrillator -[employer]        Benign neoplasm of colon    CHF (congestive heart failure) (HCC)    Chronic systolic heart failure (HCC)    Coronary artery disease     Widely patent previously placed stents in the left anterior    Crohn's disease ([place])    Deep venous thrombosis (HCC)    Recurrent-on Coumadin   Dialysis patient ([place])    Dyspnea    Gastroparesis    GERD (gastroesophageal reflux disease)    High cholesterol    Hyperlipidemia    Hypersomnolent    Previous diagnosis of narcolepsy   Hypertension, essential    Ischemic cardiomyopathy    Ejection fraction 15-20% catheterization 2010  Type II or unspecified type diabetes mellitus without mention of complication, not stated as uncontrolled    Unspecified gastritis and gastroduodenitis without mention of hemorrhage     Outpatient Medications Prior to Visit  Medication Sig Dispense Refill   amiodarone (PACERONE) 200 MG tablet Take 1 tablet (200 mg total) by mouth daily. Take 200 mg twice a day for 10 days (starting 06/13/22), then resume 200 mg daily. 90 tablet 3   aspirin EC 81 MG tablet Take 1 tablet (81 mg total) by mouth daily. 90 tablet 3   atorvastatin (LIPITOR) 40 MG tablet Take 1  tablet (40 mg total) by mouth daily at 6 PM. 90 tablet 3   calcitRIOL (ROCALTROL) 0.5 MCG capsule Take 1 capsule (0.5 mcg total) by mouth every Monday, Wednesday, and Friday. 15 capsule 5   cefadroxil (DURICEF) 500 MG capsule Take 2 capsules (1,000 mg total) by mouth 3 (three) times a week. Take AFTER dialysis sessions 24 capsule 11   citalopram (CELEXA) 20 MG tablet Take 1 tablet (20 mg total) by mouth daily. 30 tablet 6   hydrALAZINE (APRESOLINE) 25 MG tablet Take 1 tablet three times a day on NON-dialysis days only 270 tablet 3   insulin glargine (LANTUS) 100 UNIT/ML injection Inject 0.3 mLs (30 Units total) into the skin daily. (Patient taking differently: Inject 35 Units into the skin daily.) 10 mL 2   LORazepam (ATIVAN) 0.5 MG tablet Take 1 tablet (0.5 mg total) by mouth every 12 (twelve) hours as needed (nausea). 60 tablet 0   metoCLOPramide (REGLAN) 10 MG tablet Take 1 tablet (10 mg total) by mouth 3 (three) times daily. (Patient taking differently: Take 10 mg by mouth 3 (three) times daily before meals.) 90 tablet 6   midodrine (PROAMATINE) 5 MG tablet Take 3 tablets (15 mg total) by mouth every Monday, Wednesday, and Friday. Take 1 tablet before dialysis treatment 70 tablet 6   multivitamin (RENA-VIT) TABS tablet Take 1 tablet by mouth daily.     nitroGLYCERIN (NITROSTAT) 0.4 MG SL tablet Place 1 tablet (0.4 mg total) under the tongue every 5 (five) minutes as needed for chest pain. 90 tablet 3   ondansetron (ZOFRAN-ODT) 4 MG disintegrating tablet Take by mouth as needed.     oxyCODONE-acetaminophen (PERCOCET) 7.5-325 MG tablet Take 1 tablet by mouth 3 (three) times daily as needed for pain.     pantoprazole (PROTONIX) 40 MG tablet Take 1 tablet (40 mg total) by mouth 2 (two) times daily before lunch and supper. 60 tablet 11   promethazine (PHENERGAN) 12.5 MG tablet TAKE 1 TABLET (12.5 MG TOTAL) BY MOUTH EVERY 6 (SIX) HOURS AS NEEDED FOR NAUSEA OR VOMITING. 30 tablet 3   sevelamer carbonate  (RENVELA) 0.8 g PACK packet Take 0.8 g by mouth 3 (three) times daily with meals. 270 each 1   warfarin (COUMADIN) 5 MG tablet TAKE 1 TABLET BY MOUTH EVERY DAY 30 tablet 11   oxyCODONE (ROXICODONE) 5 MG immediate release tablet Take 1 tablet (5 mg total) by mouth 2 (two) times daily as needed for severe pain. Take 1-2 tablets every 4-6 hours for severe pain. (Patient not taking: Reported on 06/28/2022) 30 tablet 0   No facility-administered medications prior to visit.     Allergies  Allergen Reactions   Metformin And Related Diarrhea    Social History   Tobacco Use   Smoking status: Never   Smokeless tobacco: Never  Vaping Use   Vaping Use: Never used  Substance Use  Topics   Alcohol use: No   Drug use: No    Family History  Problem Relation Age of Onset   Colon polyps Mother    Diabetes Mother    Heart attack Father    Diabetes Father    Stroke Paternal Grandfather    Colitis Sister    Heart disease Brother    Colitis Sister    Colon cancer Neg Hx     Objective:   Vitals:   06/28/22 1014  Temp: 97.7 F (36.5 C)  TempSrc: Oral   There is no height or weight on file to calculate BMI.  Physical Exam Constitutional:      Appearance: Normal appearance. He is not ill-appearing.  HENT:     Head: Normocephalic.     Mouth/Throat:     Mouth: Mucous membranes are moist.     Pharynx: Oropharynx is clear.  Eyes:     General: No scleral icterus. Cardiovascular:     Rate and Rhythm: Normal rate.     Comments: LVAD hum Pulmonary:     Effort: Pulmonary effort is normal.  Abdominal:     Comments: Abdominal dressing is clean and dry  Musculoskeletal:        General: Normal range of motion.     Cervical back: Normal range of motion.  Skin:    Coloration: Skin is not jaundiced or pale.  Neurological:     Mental Status: He is alert and oriented to person, place, and time.  Psychiatric:        Mood and Affect: Mood normal.        Judgment: Judgment normal.      Lab Results: Lab Results  Component Value Date   WBC 10.9 (H) 07/30/2019   HGB 8.1 (L) 07/30/2019   HCT 28.3 (L) 07/30/2019   MCV 91.3 07/30/2019   PLT 313 07/30/2019    Lab Results  Component Value Date   CREATININE 7.98 (H) 07/30/2019   BUN 37 (H) 07/30/2019   NA 134 (L) 07/30/2019   K 4.6 07/30/2019   CL 96 (L) 07/30/2019   CO2 26 07/30/2019    Lab Results  Component Value Date   ALT 14 07/22/2019   AST 18 07/22/2019   ALKPHOS 112 07/22/2019   BILITOT 0.6 07/22/2019     Assessment & Plan:   Problem List Items Addressed This Visit       Unprioritized   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[place]) - Primary    [name] has had several episodes of relapsing superficial driveline infections due to MSSA over the last 10 months, each with a quick relapse in the 2 to 3 weeks after stopping each time.  He has seen Dr. Prescott Gum recently given recurrent episodes and no surgical indications for debridement at this time given no tunneling. We discussed that this is an attempt to reduce ascending infection; in reviewing literature despite chronic antibiotic suppression relapse rate can still be about 30% despite efforts.  Explained he has two high risk factors with T2DM and ESRD that contribute to infection risk and delay in wound healing.    He would like to continue the 3x weekly dosing for suppression given it has been quite favorable for the site drainage and decreasing pain around the driveline.   Will schedule a follow up via telehealth in 6 months. Encouraged him to reach out to me in the mean time if he has trouble tolerating the antibiotics or any  concerns over the driveline site.          [name], MSN, NP-C [facility] for Infectious [place] Pager: [phone] Office: [phone]  06/28/22  2:33 PM

## 2022-06-28 NOTE — Addendum Note (Signed)
Encounter addended by: Larey Dresser, MD on: 06/28/2022 12:19 PM  Actions taken: Clinical Note Signed

## 2022-06-28 NOTE — Patient Instructions (Signed)
Nice to officially meet you!  Would like to continue the cefadroxil (2 tabs) after dialysis as you have been.   Hopeful you can get to the weekly bandages again!.   Telephone visit follow up in 6 months is great!

## 2022-06-28 NOTE — Progress Notes (Addendum)
Patient presents for 2 week f/u with his daughter. Denies issues with VAD equipment.   Denies lightheadedness, dizziness, falls, shortness of breath, chest pain, and signs of bleeding. pt transferred himself from wheelchair to stretcher today. Reports good PO intake. PRN Oxy provided by pain clinic for back and groin pain- pain well managed.   SJ device interrogation performed. DR Aundra Dubin reviewed report. No shocks or runs of VT/VF. Pt confirms that he is taking Amio 200 daily.  Labs from dialysis reviewed by DR Dockter including CMET and thyroid labs.   Vital Signs:  Doppler Pressure: 118 Automatc BP: 122/77 (110) HR: 77 SR SPO2: UTO % on RA   Weight: UTO lb w/o eqt Last weight: 167 lb pt reported from dialysis center (dry wt: 75 kg)   VAD Indication: Destination therapy- Pt is no longer candidate for heart/kidney transplant due to PAD per [facility].    VAD interrogation & Equipment Management: Speed: 5600 Flow: 4 Power: 4.7 w    PI: 5.9   Alarms: 7 LF from dialysis yesterday Events: rare  Fixed speed: 5600 Low speed limit: 5300   Primary Controller: Replace back up battery in 14 months Back up controller:   Replace back up battery in 18 months   Annual Equipment Maintenance on UBC/PM was performed on 03/30/21.    I reviewed the LVAD parameters from today and compared the results to the patient's prior recorded data. LVAD interrogation was NEGATIVE for significant power changes, NEGATIVE for clinical alarms and STABLE for PI events/speed drops. No programming changes were made and pump is functioning within specified parameters. Pt is performing daily controller and system monitor self tests along with completing weekly and monthly maintenance for LVAD equipment.   LVAD equipment check completed and is in good working order. Back-up equipment present.   Exit Site Care: Drive line is being maintained twice a week by daughter. He is not wearing an anchor- refuses to wear. Existing  VAD dressing removed and site care performed using sterile technique. Drive line exit site cleaned with Chlora prep applicators x 2, allowed to dry, NO silver strip applied. Exit site healing and incorporated, the velour is fully implanted at exit site. Scant amount of brown drainage. No signs of tunneling. No redness, tenderness, foul odor or rash noted.  Pt denies fever or chills. Pt has sufficient kits at home. Will trial pt without silver strip as daughter states she pulls skin off when she removes the silver strip.    Significant Events on VAD Support:  -10/2016>> poss drive line infection, CT ABD neg, ID consult-doxy -11/09/16>> admit for poss drive line infection, IV abx -03/24/17>> doxy for poss drive line infection -05/04/17>> drive line debridement with wound-vac -06/2017>> drive line +proteus, IV abx, Bactrim x14 days  Device: St jude Dual Therapies: on 200 bpm Last check: today  BP & Labs: Doppler BP 250-[id number] systolic    Hgb 19.3- No S/S of bleeding. Specifically denies melena/BRBPR or nosebleeds.   LDH 261 - Denies tea-colored urine. No power elevations noted on interrogation.     Patient Instructions:  No change in medications  Return to clinic in 2 months  [name] RN [place] Coordinator  Office: [phone]  24/7 Pager: [phone]   Cardiology: Dr. Aundra Dubin  HPI: 56 y.o. with history of CAD s/p anterior MI in 2010 and NSTEMI in 3/18 with DES to pRCA and mLAD and ischemic cardiomyopathy now s/p Heartmate 3 LVAD and ESRD presents for followup of CHF/LVAD.  Patient developed low  output heart failure.  He was not deemed to be a good transplant candidate.  He was admitted in 10/18 and Heartmate 3 LVAD was placed.  He had baseline CKD stage 3 likely from diabetic nephropathy and CHF.  He developed peri-op AKI and ended up on dialysis.  No renal recovery to date.  St Jude ICD has been turned back on and ramp echo was done prior to discharge in 10/18, speed increased to  5600 rpm.   He was admitted in 11/18 with nausea/vomiting and abdominal pain.  This appears to have been due to diabetic gastroparesis (extensive GI evaluation). He had atrial flutter this admission that was converted to NSR with amiodarone.  He improved considerably with scheduled Reglan and erythromycin. He was re-admitted later in 11/18 with the same symptoms, suspect diabetic gastroparesis and was again treated with erythromycin.  He was not able to start domperidone due to prolonged QT interval. He had nausea again about a week ago but was able to manage at home with Phenergan suppositories.   Admitted 1/8 -> 09/16/17 for uncontrolled vomiting in setting of diabetic gastroparesis. Improved with phenergan suppositories and given erythromycin TID for 7 days post-discharge.    He was admitted in 2/19 for AV graft placement.  While in the hospital, we turned down his speed.  Since then, he has had considerably fewer PI events on HD days.    He was admitted in 4/19 with nausea/vomiting again, probably gastroparesis flare.   He was admitted in 5/19 with nausea/vomiting, likely gastroparesis flare, treated with erythromycin course which he is finishing.   He has been evaluated at [facility] by Dr. Derrill Kay (gastroparesis specialist).   Gastric emptying study, surprisingly, was near normal. However, electrogastrogram showed poor gastric accomodation/capacity, "bradygastria."  Frequent small meals were recommended. Also, early am nausea was thought to be possible GERD.    Admitted 6/26 - 03/19/18 for emergent VAD exchange 03/01/18 in the setting of HM3 outflow graft kink. He required CVVHD post op, but was transtioned back to [facility] without problems. Course complicated by leukocytosis and fever. CT abd/chest negative for infection and blood cultures were negative. Post op pain well controlled with rare tramadol. Able to tolerate diet. Evaluated by PT/OT and HH PT recommended for discharge. Ramp echo completed prior  to discharge with speed optimization. Resumed 81 mg ASA + coumadin with INR goal 2-3.   He was admitted 03/23/18 with nausea/gastroparesis symptoms and marked HTN at outpatient HD.  Erythromycin was restarted and gastroparesis symptoms were controlled with fall in BP and discharge on 03/25/18.   He was admitted in 9/19 for LUE AV fistula placement.  He was readmitted in 10/19 with his typical gastroparesis symptoms, unable to take po. The fistula later failed and was replaced with a graft.  However, the graft was nonfunctional and developed a hematoma at the surgical site.    He was admitted in 12/19 with nausea/vomiting from HD. Also with LDH increased to 427 in setting of extensive hematoma around his left arm AV graft.  He has had tingling in his left hand and left grip is weak.    In 7/20, he was admitted with ischemic 4th toe on right foot.  Peripheral angiogram 03/18/19 showed 80% right SFA stenosis and occluded right PT.  He had stenting of SFA and PTCA of PT with restoration of flow.  However, he still needed amputation right 4th toe on 7/16 with intractable pain.  He was readmitted with pain in his right foot,  nausea, and vomiting. There was concern for ongoing right foot ischemia with all toes and a portion of the foot now dusky.  He was treated with IV vancomycin for possible osteomyelitis/infection, but thought that more likely ischemia could explain all the findings.  He was discharged home to followup with vascular surgery.   He was re-admitted in 8/20 with ongoing right foot pain and ended up having transmetatarsal amputation of the right foot.  Left leg angiogram was done with DES to left SFA, only 1 vessel runoff via severely diseased peroneal artery, unable to intervene.   He was readmitted in 9/20 with ongoing right foot/lower leg pain and had right AKA.  He was discharged to [facility].    He was then admitted with gangrene in the remnant of his left foot in 11/20.  He had left AKA.     Patient was in [place] in 11/22 and fractured a metacarpal in his right hand.    Patient had episode of VT in 2/23 with ICD shock.  He was started on amiodarone.  He had another ICD discharge with VF in 10/23 in the setting of significant emotional stress (grandson had died).   Patient returns for followup of CHF/LVAD.  No further VT/VF.  Symptomatically stable, able to transfer from his wheelchair to bed or chair without dyspnea.  He did have a hard time at HD yesterday, had more low flow alarms than normal.  He says that they tried to take off more fluid than usual.  No driveline drainage, he continues cefadoxil after HD.   Reports taking Coumadin as prescribed and adherence to anticoagulation based dietary restrictions. Denies BRBPR or melena. No dark urine or hematuria.   St Jude device interrogation: No VT, no AF, stable thoracic impedance.   Labs (11/18): hgb 10.4 => 8.7, LDH 213 Labs (08/04/17): hgb 9 Labs (1/19): HgbA1c 6.1 Labs (2/19): hgb 13.2 Labs (3/19): hgb 11.1 Labs (7/19): hgb 9.6 Labs (10/19: hgb 11.8 Labs (12/19): LDH 427 => 237, hgb 7.7 Labs (11/20): hgb 8.1 Labs (1/21): HIV negative, RPR negative Labs (3/21): hgb 8.8 Labs (6/21): hgb 12.4 Labs (8/21): hgb 10.8, LDH 201 Labs (10/21): hgb 12.4, LDH 220 Labs (12/21): hgb 12, LDH 228 Labs (3/22): hgb 11.7 Labs (5/22): hgb 10.7, LDH 180 Labs (11/22): hgb 10.4 Labs (1/23): hgb 10.1 Labs (4/23): T4 normal, TSH mildly elevated 5.4 Labs (5/23): LDL 187, hgb 10.8 Labs (10/23): LFTs normal, TSH normal, hgb 12.2  PMH: 1. Chronic systolic CHF: Ischemic cardiomyopathy.  St Jude ICD.  - Echo (11/17) with EF 25-30%, moderate diastolic dysfunction, mild MR, mildly dilated RV with moderately decreased systolic function, peak RV-RA gradient 48 mmHg.  - Echo (3/18) with EF 25-30%, wall motion abnormalities, normal RV size and systolic function.  - Admission 3/18 with cardiogenic shock and AKI, required milrinone gtt.  - Echo  (8/18) with EF 20-25%, mild LVH, moderate LV dilation, mild MR.  - CPX (8/18) with peak VO2 12.4, VE/VCO2 slope 35, RER 1.05 => severe HR limitation.  - Heartmate 3 LVAD 10/18.  - Pump thrombosis in setting of HM3 outflow graft kink.  Pump exchange 6/19 for HM3.  2. CAD: Anterior MI 2005, had bifurcation stenting LAD/diagonal.  Repeat cath 2010 with patent stents.  - NSTEMI 3/18: LHC with 99% ulcerated lesion proximal RCA, 95% mid LAD => PCI with DES to proximal RCA and mid LAD.  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  - Lower extremity venous dopplers (8/18): chronic DVT - V/Q  scan (8/18): Normal, no PE.  4. Crohns disease: History of colectomy.  5. GERD 6. Diabetic gastroparesis: Admission 11/18 with abdominal pain, nausea/vomiting.  - [facility] workup 2019: Gastric emptying study near normal.  Negative breath test (no evidence for small bowel bacterial overgrowth).  Electrogastrogram showed poor gastric accomodation/capacity, "bradygastria."  7. HTN 8. Hyperlipidemia 9. Type II diabetes  10. ESRD: Post-op LVAD surgery.  He has had failed AV fistula and AV graft, now dialyzing via catheter . 11. Carotid dopplers (8/18): mild bilateral stenosis.  12. PFTs (8/18): Mild restriction (no IDL on CT chest).  13. Lung nodules: CT chest 8/18 with small bilateral nodules.  14. PAD: ABIs with moderate bilateral disease in 8/18.  - Peripheral angiogram 03/18/19 showed 80% right SFA stenosis and occluded right PT.  He had stenting of SFA and PTCA of PT with restoration of flow.  However, he still needed amputation right 4th toe on 03/21/19 with intractable pain.  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  - Peripheral angiogram left leg: DES to left SFA, only 1 vessel runoff via severely diseased peroneal artery, unable to intervene. 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  - Left AKA 11/20.  15. Atrial flutter: In 11/18 associated with nausea/vomiting from gastroparesis.  16. VT: VT with ICD shock in 11/22.  - VT with ICD shock in  2/23, amiodarone started.  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 Take 200 mg twice a day for 10 days (starting 06/13/22), then resume 200 mg daily. 90 tablet 3   aspirin EC 81 MG tablet Take 1 tablet (81 mg total) by mouth daily. 90 tablet 3   atorvastatin (LIPITOR) 40 MG tablet Take 1 tablet (40 mg total) by mouth daily at 6 PM. 90 tablet 3   calcitRIOL (ROCALTROL) 0.5 MCG capsule Take 1 capsule (0.5 mcg total) by mouth every Monday, Wednesday, and Friday. 15 capsule 5   cefadroxil (DURICEF) 500 MG capsule Take 2 capsules (1,000 mg total) by mouth 3 (three) times a week. Take AFTER dialysis sessions 24 capsule 11   citalopram (CELEXA) 20 MG tablet Take 1 tablet (20 mg total) by mouth daily. 30 tablet 6   hydrALAZINE (APRESOLINE) 25 MG tablet Take 1 tablet three times a day on NON-dialysis days only 270 tablet 3   insulin glargine (LANTUS) 100 UNIT/ML injection Inject 0.3 mLs (30 Units total) into the skin daily. (Patient taking differently: Inject 35 Units into the skin daily.) 10 mL 2   LORazepam (ATIVAN) 0.5 MG tablet Take 1 tablet (0.5 mg total) by mouth every 12 (twelve) hours as needed (nausea). 60 tablet 0   metoCLOPramide (REGLAN) 10 MG tablet Take 1 tablet (10 mg total) by mouth 3 (three) times daily. (Patient taking differently: Take 10 mg by mouth 3 (three) times daily before meals.) 90 tablet 6   midodrine (PROAMATINE) 5 MG tablet Take 3 tablets (15 mg total) by mouth every Monday, Wednesday, and Friday. Take 1 tablet before dialysis treatment 70 tablet 6   multivitamin (RENA-VIT) TABS tablet Take 1 tablet by mouth daily.     ondansetron (ZOFRAN-ODT) 4 MG disintegrating tablet Take by mouth as needed.     oxyCODONE-acetaminophen (PERCOCET) 7.5-325 MG tablet Take 1 tablet by mouth 3 (three) times daily as needed for pain.      pantoprazole (PROTONIX) 40 MG tablet Take 1 tablet (40  mg total) by mouth 2 (two) times daily before lunch and supper. 60 tablet 11   promethazine (PHENERGAN) 12.5 MG tablet TAKE 1 TABLET (12.5 MG TOTAL) BY MOUTH EVERY 6 (SIX) HOURS AS NEEDED FOR NAUSEA OR VOMITING. 30 tablet 3   sevelamer carbonate (RENVELA) 0.8 g PACK packet Take 0.8 g by mouth 3 (three) times daily with meals. 270 each 1   warfarin (COUMADIN) 5 MG tablet TAKE 1 TABLET BY MOUTH EVERY DAY 30 tablet 11   nitroGLYCERIN (NITROSTAT) 0.4 MG SL tablet Place 1 tablet (0.4 mg total) under the tongue every 5 (five) minutes as needed for chest pain. 90 tablet 3   oxyCODONE (ROXICODONE) 5 MG immediate release tablet Take 1 tablet (5 mg total) by mouth 2 (two) times daily as needed for severe pain. Take 1-2 tablets every 4-6 hours for severe pain. (Patient not taking: Reported on 06/28/2022) 30 tablet 0   No current facility-administered medications for this encounter.    Metformin and related   Review of systems complete and found to be negative unless listed in HPI.    BP (!) 118/0   Pulse 77   Ht 5' 8"  (1.727 m)   Wt 75 kg (165 lb 5.5 oz)   BMI 25.14 kg/m     VAD interrogation & Equipment Management: See LVAD nurse's note above.   General: Well appearing this am. NAD.  HEENT: Normal. Neck: Supple, JVP 7-8 cm. Carotids OK.  Cardiac:  Mechanical heart sounds with LVAD hum present.  Lungs:  CTAB, normal effort.  Abdomen:  NT, ND, no HSM. No bruits or masses. +BS  LVAD exit site: Well-healed and incorporated. Dressing dry and intact. No erythema or drainage. Stabilization device present and accurately applied. Driveline dressing changed daily per sterile technique. Extremities:  Warm and dry. No cyanosis, clubbing, rash, or edema. S/p bilateral AKAs Neuro:  Alert & oriented x 3. Cranial nerves grossly intact. Moves all 4 extremities w/o difficulty. Affect pleasant    ASSESSMENT AND PLAN: 1. PAD/ischemic feet:  He is now s/p  bilateral AKAs.  Generally able to wheel his chair and do transfers without dyspnea.  2. Chronic systolic CHF: Ischemic cardiomyopathy.  St Jude ICD.  NSTEMI in March 2018 with DES to LAD and RCA, complicated by cardiogenic shock, low output requiring milrinone.  Echo 8/18 with EF 20-25%, moderate MR. CPX (8/18) with severe functional impairment due to HF.  He is s/p HM3 LVAD placement. He had pump thrombosis 6/19 due to [facility] outflow graft kinking.  He had pump exchange for another HM3.  LVAD parameters stable but he does have PI events and low flow events at times with HD. He takes midodrine pre-HD, but needs hydralazine on non-HD days.     - Continue pre-HD midodrine 10 mg.   - Volume managed at dialysis, MWF.  Would be careful to avoid excess UF given LVAD.  - Continue ASA 81 and warfarin with INR goal 2-2.5.    - Continue hydralazine 25 mg tid on non-HD days. - He is not candidate for transplant now given extensive PAD with amputations.   3. ESRD: HD on M-W-F with midodrine pre-HD.  - Need to allow UF as long as MAP > 70 (would not stop for MAP 70-80).   4.  CAD: NSTEMI in 3/18.  LHC with 99% ulcerated lesion proximal RCA with left to right collaterals, 95% mid LAD stenosis after mid LAD stent.  s/p PCI to RCA and LAD on 11/25/16. He had  atypical chest pain up until Friday when he had ICD discharge, none since then.  - Continue statin and ASA.  5. h/o DVTs: Factor V Leiden heterozygote. - Anticoagulated. 6. Diabetic gastroparesis: He has seen gastroparesis specialist at [facility]. Surprisingly, gastric emptying study was normal. However, electrogastrogram showed poor gastric accomodation/capacity. Therefore, small frequent meals recommended.  He has been doing much better with this, minimal symptoms.  - Continue reglan 10 mg tid/ac, now off Marinol.  - Continue Protonix bid.  - Limit narcotic use as much as possible.  7. HTN: Takes hydralazine 25 tid on non-HD days.  8. Chronic pain: Now seen  in a pain clinic.  9. Atrial flutter: Paroxysmal.  Not seen recently.  10. VT: He was started on amiodarone after 2/23 ICD shock. VF again with shock in 10/23 in setting of extreme stress. No further events.  - Continue amiodarone 200 mg daily.  Recent LFTs and TSH normal, will need regular eye exam.   - he does not drive.  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  Site looks improved.   - Will continue cefadroxil long-term (three days/week after HD).  This will be followed by ID.   Followup in 2 months.   [name] 06/28/2022

## 2022-06-29 ENCOUNTER — Ambulatory Visit (HOSPITAL_COMMUNITY): Payer: Self-pay | Admitting: Pharmacist

## 2022-06-29 LAB — POCT INR: INR: 2.3 (ref 2.0–3.0)

## 2022-07-05 ENCOUNTER — Ambulatory Visit (HOSPITAL_COMMUNITY): Payer: Self-pay | Admitting: Pharmacist

## 2022-07-05 ENCOUNTER — Other Ambulatory Visit: Payer: Self-pay | Admitting: Family Medicine

## 2022-07-05 DIAGNOSIS — E118 Type 2 diabetes mellitus with unspecified complications: Secondary | ICD-10-CM

## 2022-07-05 LAB — POCT INR: INR: 2.8 (ref 2.0–3.0)

## 2022-07-06 ENCOUNTER — Other Ambulatory Visit (HOSPITAL_COMMUNITY): Payer: Self-pay | Admitting: Unknown Physician Specialty

## 2022-07-06 DIAGNOSIS — Z794 Long term (current) use of insulin: Secondary | ICD-10-CM

## 2022-07-06 MED ORDER — LANTUS SOLOSTAR 100 UNIT/ML ~~LOC~~ SOPN: 35.0000 [IU] | PEN_INJECTOR | Freq: Every day | SUBCUTANEOUS | 2 refills | Status: DC

## 2022-07-14 ENCOUNTER — Ambulatory Visit (HOSPITAL_COMMUNITY): Payer: Self-pay | Admitting: Pharmacist

## 2022-07-14 LAB — POCT INR: INR: 2.1 (ref 2.0–3.0)

## 2022-07-20 ENCOUNTER — Ambulatory Visit (HOSPITAL_COMMUNITY): Payer: Self-pay | Admitting: Pharmacist

## 2022-07-20 LAB — POCT INR: INR: 2.4 (ref 2.0–3.0)

## 2022-07-23 ENCOUNTER — Other Ambulatory Visit (HOSPITAL_COMMUNITY): Payer: Self-pay | Admitting: Cardiology

## 2022-07-26 ENCOUNTER — Ambulatory Visit (HOSPITAL_COMMUNITY): Payer: Self-pay | Admitting: Pharmacist

## 2022-07-26 LAB — POCT INR: INR: 2.7 (ref 2.0–3.0)

## 2022-07-29 NOTE — Progress Notes (Signed)
Received a page from patient stating he was having asymptomatic LOW FLOW alarms at dialysis this morning that have resolved after dialysis nurse gave back 400cc of fluid. The nurse is currently just filtering and has already pulled off 2.4L. Advised dialysis nurse to try to keep even for remainder of treatment and if pt continues to LOW FLOW to give back 250-500cc of volume. Dr Haroldine Laws made aware.  Bobbye Morton RN, BSN VAD Coordinator 24/7 Pager [phone]

## 2022-08-02 ENCOUNTER — Ambulatory Visit (HOSPITAL_COMMUNITY): Payer: Self-pay | Admitting: Pharmacist

## 2022-08-02 LAB — POCT INR: INR: 1.8 — AB (ref 2.0–3.0)

## 2022-08-04 NOTE — Progress Notes (Unsigned)
    SUBJECTIVE:   CHIEF COMPLAINT / HPI:   Patient presents for need of a new wheelchair, he is accompanied by his son. He was required to be seen prior to the new order being placed. Patient says that they have already taken the measurements for the new wheelchair. He has been wheelchair bound since 3 years ago, s/p bilateral AKA secondary to PAD. He requires a wheelchair for assistance with getting around given his bilateral AKA and will be near impossible to function without this. He denies any symptoms or other concerns today.   OBJECTIVE:   BP 100/81  Pulse 61   SpO2 98%   General: Patient well-appearing, in no acute distress. CV: RRR, no murmurs or gallops auscultated Resp: CTAB, no wheezing, rales or rhonchi noted Abdomen: soft, nontender, presence of bowel sounds Neuro: wheelchair dependence   ASSESSMENT/PLAN:   Wheelchair dependence -PCP to fax paperwork -order already sent in and in process -patient qualifies for another wheelchair given his medical need -continue to maintain appropriate follow up with PCP   Benign essential HTN -BP at goal -continue current antihypertensive regimen -patient may benefit from labs as per chart review he has not had any since 2020 but he is only able to obtain these at HD, scheduled upcoming appointment with Dr. Ronnald Ramp to follow up on other chronic conditions. Consider BMP  Health maintenance  -scheduled follow up with Dr. Ronnald Ramp for possible A1c check as patient is due for this  -Med rec reviewed and updated appropriately    Donney Dice, [place]

## 2022-08-05 ENCOUNTER — Ambulatory Visit (INDEPENDENT_AMBULATORY_CARE_PROVIDER_SITE_OTHER): Payer: Medicare Other | Admitting: Family Medicine

## 2022-08-05 ENCOUNTER — Encounter: Payer: Self-pay | Admitting: Student

## 2022-08-05 VITALS — BP 100/81 | HR 61

## 2022-08-05 DIAGNOSIS — Z993 Dependence on wheelchair: Secondary | ICD-10-CM | POA: Diagnosis not present

## 2022-08-05 DIAGNOSIS — I10 Essential (primary) hypertension: Secondary | ICD-10-CM

## 2022-08-05 NOTE — Patient Instructions (Addendum)
It was great seeing you today!  Please follow up with your PCP, Dr. Ronnald Ramp, for a diabetes check up.   Please follow up at your next scheduled appointment on Dec 14 at 1:50 pm with Dr. Ronnald Ramp, if anything arises between now and then, please don't hesitate to contact our office.   Thank you for allowing [place] to be a part of your medical care!  Thank you, Dr. Larae Grooms  Also a reminder of our clinic's no-show policy. Please make sure to arrive at least 15 minutes prior to your scheduled appointment time. Please try to cancel before 24 hours if you are not able to make it. If you no-show for 2 appointments then you will be receiving a warning letter. If you no-show after 3 visits, then you may be at risk of being dismissed from our clinic. This is to ensure that everyone is able to be seen in a timely manner. Thank you, we appreciate your assistance with this!

## 2022-08-06 DIAGNOSIS — Z993 Dependence on wheelchair: Secondary | ICD-10-CM | POA: Insufficient documentation

## 2022-08-06 NOTE — Assessment & Plan Note (Signed)
-  PCP to fax paperwork -order already sent in and in process -patient qualifies for another wheelchair given his medical need -continue to maintain appropriate follow up with PCP

## 2022-08-06 NOTE — Assessment & Plan Note (Signed)
-  BP at goal -continue current antihypertensive regimen -patient may benefit from labs as per chart review he has not had any since 2020 but he is only able to obtain these at HD, scheduled upcoming appointment with Dr. Ronnald Ramp to follow up on other chronic conditions. Consider BMP

## 2022-08-09 ENCOUNTER — Ambulatory Visit (HOSPITAL_COMMUNITY): Payer: Self-pay | Admitting: Pharmacist

## 2022-08-09 LAB — POCT INR: INR: 2.2 (ref 2.0–3.0)

## 2022-08-16 ENCOUNTER — Ambulatory Visit (HOSPITAL_COMMUNITY): Payer: Self-pay | Admitting: Pharmacist

## 2022-08-16 LAB — POCT INR: INR: 2.4 (ref 2.0–3.0)

## 2022-08-17 NOTE — Progress Notes (Signed)
    SUBJECTIVE:   CHIEF COMPLAINT / HPI:   T2DM Patient currently takes Lantus 35 units daily. HgA1c today of 6.2. it will be important for him to have good sugar control as T2DM is a risk factor for recurrent infections of MSSA of his superficial driveline which have occurred over the last 10 months.  Infection associated with driveline of LVAD Patient has had recurrent infections of driveline of LVAD.  Currently is following with cardiology and infectious disease.  He is on suppressive antibiotics which seem to be working currently.  T2DM and ESRD both risk factors for infection.  Lesion on face Had facial abscess in January treated with clindamycin. Today states he shaved recently and noticed he has a lesion in the same area which he would like to get checked out. Denies and pain or drainage. No fevers.   PERTINENT  PMH / PSH: CAD s/p anterior MI and NSTEMI, s/p HeartMate 3 LVAD, ESRD, T2DM  OBJECTIVE:   Vitals:   08/18/22 1348  BP: 122/84  Pulse: 86  SpO2: 97%   General: NAD, pleasant, able to participate in exam Cardiac: Well perfused  Respiratory: Breathing comfortably on RA Extremities: Bilateral AKA Skin: Scaly skin lesion on R cheek. See photo  Neuro: alert, no obvious focal deficits Psych: Normal affect and mood  ASSESSMENT/PLAN:   Diabetes mellitus type 2 in nonobese (HCC) Hemoglobin A1c at goal of 6.2 today.  Patient will continue Lantus 35 units daily.  Infection associated with driveline of left ventricular assist device (LVAD) [facility]) Patient will continue suppressive therapy and continue to follow with cardiology and infectious disease.  I will continue to monitor blood sugars as needed although they have been well-controlled on Lantus.  Patient will continue to follow with his nephrologist  and get HD as ESRD is also a risk factor for recurrent infections.  Skin lesion of cheek Skin lesion does not look like it would be related to previous abscess although I  suppose he could have chronic skin changes from the abscess.  There is no warmth, erythema, or drainage currently.  The lesion is scaly and I would like to rule out squamous cell carcinoma.  Patient is scheduled to have biopsy in Derm clinic on December 21.     Dr. Precious Gilding, [place]

## 2022-08-18 ENCOUNTER — Ambulatory Visit (INDEPENDENT_AMBULATORY_CARE_PROVIDER_SITE_OTHER): Payer: Medicare Other | Admitting: Student

## 2022-08-18 ENCOUNTER — Other Ambulatory Visit: Payer: Self-pay | Admitting: Student

## 2022-08-18 ENCOUNTER — Encounter: Payer: Self-pay | Admitting: Student

## 2022-08-18 VITALS — BP 122/84 | HR 86

## 2022-08-18 DIAGNOSIS — E119 Type 2 diabetes mellitus without complications: Secondary | ICD-10-CM | POA: Diagnosis not present

## 2022-08-18 DIAGNOSIS — E118 Type 2 diabetes mellitus with unspecified complications: Secondary | ICD-10-CM | POA: Diagnosis not present

## 2022-08-18 DIAGNOSIS — L989 Disorder of the skin and subcutaneous tissue, unspecified: Secondary | ICD-10-CM | POA: Diagnosis not present

## 2022-08-18 DIAGNOSIS — Z794 Long term (current) use of insulin: Secondary | ICD-10-CM

## 2022-08-18 DIAGNOSIS — T827XXA Infection and inflammatory reaction due to other cardiac and vascular devices, implants and grafts, initial encounter: Secondary | ICD-10-CM

## 2022-08-18 LAB — POCT GLYCOSYLATED HEMOGLOBIN (HGB A1C): HbA1c, POC (controlled diabetic range): 6.2 % (ref 0.0–7.0)

## 2022-08-18 MED ORDER — INSULIN GLARGINE 100 UNIT/ML ~~LOC~~ SOLN: 35.0000 [IU] | Freq: Every day | SUBCUTANEOUS | 11 refills | Status: DC

## 2022-08-18 NOTE — Patient Instructions (Addendum)
It was great to see you! Thank you for allowing me to participate in your care!  I recommend that you always bring your medications to each appointment as this makes it easy to ensure you are on the correct medications and helps [place] not miss when refills are needed.  Our plans for today:  - I have referred you to our dermatology clinic to get a biopsy done for the lesion on your cheek.  - Please return for a follow up visit with me in 4-6 months or sooner if needed.    Take care and seek immediate care sooner if you develop any concerns.   Dr. Precious Gilding, DO [facility]

## 2022-08-18 NOTE — Assessment & Plan Note (Signed)
Patient will continue suppressive therapy and continue to follow with cardiology and infectious disease.  I will continue to monitor blood sugars as needed although they have been well-controlled on Lantus.  Patient will continue to follow with his nephrologist  and get HD as ESRD is also a risk factor for recurrent infections.

## 2022-08-18 NOTE — Assessment & Plan Note (Signed)
Hemoglobin A1c at goal of 6.2 today.  Patient will continue Lantus 35 units daily.

## 2022-08-18 NOTE — Assessment & Plan Note (Signed)
Skin lesion does not look like it would be related to previous abscess although I suppose he could have chronic skin changes from the abscess.  There is no warmth, erythema, or drainage currently.  The lesion is scaly and I would like to rule out squamous cell carcinoma.  Patient is scheduled to have biopsy in Derm clinic on December 21.

## 2022-08-23 ENCOUNTER — Ambulatory Visit (HOSPITAL_COMMUNITY): Payer: Self-pay | Admitting: Pharmacist

## 2022-08-23 LAB — POCT INR: INR: 2.9 (ref 2.0–3.0)

## 2022-08-25 ENCOUNTER — Ambulatory Visit (INDEPENDENT_AMBULATORY_CARE_PROVIDER_SITE_OTHER): Payer: Medicare Other | Admitting: Student

## 2022-08-25 VITALS — BP 122/80 | HR 82 | Temp 98.5°F

## 2022-08-25 DIAGNOSIS — L989 Disorder of the skin and subcutaneous tissue, unspecified: Secondary | ICD-10-CM | POA: Diagnosis not present

## 2022-08-25 MED ORDER — TRIAMCINOLONE ACETONIDE 0.1 % EX OINT: 1.0000 | TOPICAL_OINTMENT | Freq: Two times a day (BID) | CUTANEOUS | 0 refills | Status: DC

## 2022-08-25 MED ORDER — INSULIN GLARGINE 100 UNIT/ML ~~LOC~~ SOLN: 35.0000 [IU] | SUBCUTANEOUS | 11 refills | Status: DC

## 2022-08-25 NOTE — Progress Notes (Signed)
  SUBJECTIVE:   CHIEF COMPLAINT / HPI:   F/u lesion on face First noticed several months ago. Patient thought it was an ingrown hair and tried to pull it out with tweezers/unroofed it. It bleed a little but never looked infected or drained fluid. It hasn't gotten bigger since he's noticed it, and no concerns for infection. Could have been abscess from 08/25/21 originally.     PERTINENT  PMH / PSH:    OBJECTIVE:  BP 122/80   Pulse 82   Temp 98.5 F (36.9 C)   SpO2 98%  Skin: hyperpigmented nodule with crusting, less than a cm in diameter    ASSESSMENT/PLAN:  Skin lesion of cheek Assessment & Plan: Patient presents for f/u of skin lesion on cheek. Report's its been there for months and he thought was an ingrown hair. Per chart review, patient had abscess in area in 08/25/21 that was treated with abx and burst/drained at home. Patient did not notice lesion after abscess as he had a beard. Patient notes that lesion does not itch or bother him and has not been growing. Lesion less than cm in diameter with pea sized area of induration. Patient also having post inflammatory hyperpigmentation. Given hx and exam, this appears to be remnant of abscess. Will treat with low potency steroid to help with inflammation. If lesion does not improve, will consider biopsy. -Triamcinolone 0.1% BID for 3 months -F/u in 3 months for bx if lesion not regressing/improving -F/u w/ PCP in 6 months if lesion improving   Other orders -     Triamcinolone Acetonide; Apply 1 Application topically 2 (two) times daily.  Dispense: 30 g; Refill: 0 -     Insulin Glargine; Inject 0.35 mLs (35 Units total) into the skin every morning.  Dispense: 3 mL; Refill: 11   No follow-ups on file. Holley Bouche, MD 08/25/2022, 11:57 AM PGY-2, [place]

## 2022-08-25 NOTE — Patient Instructions (Signed)
It was great to see you! Thank you for allowing me to participate in your care!  It looks like the lesion on your face is what was left over from the abscess. We will continue to monitor this and give you an ointment to apply. Lesion should slowly go away over the course of months.   Our plans for today:  - Lesion  Triamcinolone ointment (pea sized amount) on area twice a day  IF lesion not shrinking/going away in 3 months, return for biopsy  IF lesion is shrinking, follow up with your doctor in 6 months  Take care and seek immediate care sooner if you develop any concerns.   Dr. Holley Bouche, MD [place]

## 2022-08-25 NOTE — Assessment & Plan Note (Signed)
Patient presents for f/u of skin lesion on cheek. Report's its been there for months and he thought was an ingrown hair. Per chart review, patient had abscess in area in 08/25/21 that was treated with abx and burst/drained at home. Patient did not notice lesion after abscess as he had a beard. Patient notes that lesion does not itch or bother him and has not been growing. Lesion less than cm in diameter with pea sized area of induration. Patient also having post inflammatory hyperpigmentation. Given hx and exam, this appears to be remnant of abscess. Will treat with low potency steroid to help with inflammation. If lesion does not improve, will consider biopsy. -Triamcinolone 0.1% BID for 3 months -F/u in 3 months for bx if lesion not regressing/improving -F/u w/ PCP in 6 months if lesion improving

## 2022-08-30 ENCOUNTER — Ambulatory Visit (HOSPITAL_COMMUNITY): Payer: Self-pay | Admitting: Pharmacist

## 2022-08-30 LAB — POCT INR: INR: 2.2 (ref 2.0–3.0)

## 2022-09-02 ENCOUNTER — Other Ambulatory Visit (HOSPITAL_COMMUNITY): Payer: Self-pay

## 2022-09-02 ENCOUNTER — Other Ambulatory Visit: Payer: Self-pay

## 2022-09-02 DIAGNOSIS — Z7901 Long term (current) use of anticoagulants: Secondary | ICD-10-CM

## 2022-09-02 DIAGNOSIS — Z95811 Presence of heart assist device: Secondary | ICD-10-CM

## 2022-09-06 ENCOUNTER — Encounter (HOSPITAL_COMMUNITY): Payer: Self-pay | Admitting: Cardiology

## 2022-09-06 ENCOUNTER — Ambulatory Visit (HOSPITAL_COMMUNITY)
Admission: RE | Admit: 2022-09-06 | Discharge: 2022-09-06 | Disposition: A | Discharge status: Home / Self Care | Payer: Medicare Other | Source: Ambulatory Visit | Attending: Cardiology | Admitting: Cardiology

## 2022-09-06 ENCOUNTER — Ambulatory Visit (HOSPITAL_COMMUNITY): Payer: Self-pay | Admitting: Pharmacist

## 2022-09-06 ENCOUNTER — Encounter (HOSPITAL_COMMUNITY): Payer: Medicare Other

## 2022-09-06 VITALS — BP 140/112 | HR 90

## 2022-09-06 DIAGNOSIS — I472 Ventricular tachycardia, unspecified: Secondary | ICD-10-CM | POA: Diagnosis not present

## 2022-09-06 DIAGNOSIS — E1143 Type 2 diabetes mellitus with diabetic autonomic (poly)neuropathy: Secondary | ICD-10-CM | POA: Insufficient documentation

## 2022-09-06 DIAGNOSIS — Z89611 Acquired absence of right leg above knee: Secondary | ICD-10-CM | POA: Diagnosis not present

## 2022-09-06 DIAGNOSIS — I255 Ischemic cardiomyopathy: Secondary | ICD-10-CM | POA: Insufficient documentation

## 2022-09-06 DIAGNOSIS — Z452 Encounter for adjustment and management of vascular access device: Secondary | ICD-10-CM | POA: Diagnosis not present

## 2022-09-06 DIAGNOSIS — Z955 Presence of coronary angioplasty implant and graft: Secondary | ICD-10-CM | POA: Diagnosis not present

## 2022-09-06 DIAGNOSIS — N186 End stage renal disease: Secondary | ICD-10-CM | POA: Diagnosis not present

## 2022-09-06 DIAGNOSIS — Z95811 Presence of heart assist device: Secondary | ICD-10-CM | POA: Diagnosis not present

## 2022-09-06 DIAGNOSIS — K3184 Gastroparesis: Secondary | ICD-10-CM | POA: Insufficient documentation

## 2022-09-06 DIAGNOSIS — Z7982 Long term (current) use of aspirin: Secondary | ICD-10-CM | POA: Diagnosis not present

## 2022-09-06 DIAGNOSIS — Z794 Long term (current) use of insulin: Secondary | ICD-10-CM | POA: Insufficient documentation

## 2022-09-06 DIAGNOSIS — I5022 Chronic systolic (congestive) heart failure: Secondary | ICD-10-CM | POA: Diagnosis not present

## 2022-09-06 DIAGNOSIS — Z7901 Long term (current) use of anticoagulants: Secondary | ICD-10-CM | POA: Diagnosis not present

## 2022-09-06 DIAGNOSIS — I132 Hypertensive heart and chronic kidney disease with heart failure and with stage 5 chronic kidney disease, or end stage renal disease: Secondary | ICD-10-CM | POA: Insufficient documentation

## 2022-09-06 DIAGNOSIS — Z89612 Acquired absence of left leg above knee: Secondary | ICD-10-CM | POA: Diagnosis not present

## 2022-09-06 DIAGNOSIS — Z992 Dependence on renal dialysis: Secondary | ICD-10-CM | POA: Diagnosis not present

## 2022-09-06 DIAGNOSIS — E1151 Type 2 diabetes mellitus with diabetic peripheral angiopathy without gangrene: Secondary | ICD-10-CM | POA: Diagnosis not present

## 2022-09-06 DIAGNOSIS — Z79899 Other long term (current) drug therapy: Secondary | ICD-10-CM | POA: Diagnosis not present

## 2022-09-06 DIAGNOSIS — I2510 Atherosclerotic heart disease of native coronary artery without angina pectoris: Secondary | ICD-10-CM | POA: Diagnosis not present

## 2022-09-06 DIAGNOSIS — E1122 Type 2 diabetes mellitus with diabetic chronic kidney disease: Secondary | ICD-10-CM | POA: Insufficient documentation

## 2022-09-06 DIAGNOSIS — Z86718 Personal history of other venous thrombosis and embolism: Secondary | ICD-10-CM | POA: Insufficient documentation

## 2022-09-06 LAB — POCT INR: INR: 2.2 (ref 2.0–3.0)

## 2022-09-06 MED ORDER — MIDODRINE HCL 5 MG PO TABS: 10.0000 mg | ORAL_TABLET | ORAL | 6 refills | Status: DC

## 2022-09-06 NOTE — Progress Notes (Addendum)
Patient presents for 2 month f/u with 4.5 year Intermacs with his son. Denies issues with VAD equipment.   Denies lightheadedness, dizziness, falls, shortness of breath, chest pain, and signs of bleeding. [name] transferred him from wheelchair to stretcher today. Reports good PO intake. PRN Oxy provided by pain clinic for back and groin pain- pain well managed.   BP elevated today. He has not taken morning medications. Per Dr Aundra Dubin will decrease Midodrine to 10 mg on HD days. Pt advised if he has reoccurrence of low BP/low flows during HD treatment to notify VAD coordinators. He verbalized understanding.   Pt confirms that he is taking Amio 200 daily. Will obtain CMET and TSH with next HD labs per Dr Aundra Dubin.  Labs from dialysis 08/18/22 reviewed by Dr Aundra Dubin.   Pt reports bruising on bilateral upper flank area that started 2 weeks ago. Pt denies trauma. Nodular areas felt upon palpation. Dr Aundra Dubin assessed- he feels this is possibly tissue necrosis from repeated trauma from transfers multiple times per day. Will continue to monitor, and if bruising or nodular areas worsen may need ultrasound of area.     Vital Signs:  Doppler Pressure: 160 Automatc BP: 140/112 (122) HR: 90 SR SPO2: 100% on RA   Weight: UTO lb w/o eqt Last weight: 167 lb pt reported from dialysis center (dry wt: 76 kg)   VAD Indication: Destination therapy- Pt is no longer candidate for heart/kidney transplant due to PAD per [facility].    VAD interrogation & Equipment Management: Speed: 5600 Flow: 3.5 Power: 4.7 w    PI: 8.0   Alarms: few low voltage advisories Events: rare, except 40 + on HD days  Fixed speed: 5600 Low speed limit: 5300   Primary Controller: Replace back up battery in 11 months Back up controller:   Replace back up battery in 13 months   Annual Equipment Maintenance on UBC/PM was performed on 03/30/21.    I reviewed the LVAD parameters from today and compared the results to the patient's prior  recorded data. LVAD interrogation was NEGATIVE for significant power changes, NEGATIVE for clinical alarms and STABLE for PI events/speed drops. No programming changes were made and pump is functioning within specified parameters. Pt is performing daily controller and system monitor self tests along with completing weekly and monthly maintenance for LVAD equipment.   LVAD equipment check completed and is in good working order. Back-up equipment present.   Exit Site Care: Drive line is being maintained twice a week by daughter. He is not wearing an anchor- refuses to wear. Existing VAD dressing removed and site care performed using sterile technique. Drive line exit site cleaned with Chlora prep applicators x 2, allowed to dry, NO silver strip applied. Exit site healing and incorporated, the velour is fully implanted at exit site. Scant amount of brown drainage. No signs of tunneling. No redness, tenderness, foul odor or rash noted.  Pt denies fever or chills. Pt provided with 14 daily kits for home use.      Significant Events on VAD Support:  -10/2016>> poss drive line infection, CT ABD neg, ID consult-doxy -11/09/16>> admit for poss drive line infection, IV abx -03/24/17>> doxy for poss drive line infection -05/04/17>> drive line debridement with wound-vac -06/2017>> drive line +proteus, IV abx, Bactrim x14 days  Device: St jude Dual Therapies: on 200 bpm Last check: today  BP & Labs: Doppler BP 540 -modified systolic. Has not taken medications yet today.   Hgb 11.2 on HD labs 08/18/22-  No S/S of bleeding. Specifically denies melena/BRBPR or nosebleeds.   LDH 222 on HD labs 08/18/22 - Denies tea-colored urine. No power elevations noted on interrogation.   4.5 year Intermacs follow up completed including:  Quality of Life, KCCQ-12, and Neurocognitive trail making.   Unable to complete 6 MW- bilateral AKA  Back up controller: 11V backup battery charged during this visit.  Patient  Goals: To continue to play an active role in his grandkids lives.    Patient Instructions:  Decrease Midodrine to 10 mg on HD days. If you have recurrent low flows notify VAD coordinators Coumadin dosing per [place] PharmD Return to clinic in 2 months for follow up with Dr Dwyane Dee RN [place] Coordinator  Office: [phone]  24/7 Pager: [phone]   Cardiology: Dr. Aundra Dubin  HPI: 57 y.o. with history of CAD s/p anterior MI in 2010 and NSTEMI in 3/18 with DES to pRCA and mLAD and ischemic cardiomyopathy now s/p Heartmate 3 LVAD and ESRD presents for followup of CHF/LVAD.  Patient developed low output heart failure.  He was not deemed to be a good transplant candidate.  He was admitted in 10/18 and Heartmate 3 LVAD was placed.  He had baseline CKD stage 3 likely from diabetic nephropathy and CHF.  He developed peri-op AKI and ended up on dialysis.  No renal recovery to date.  St Jude ICD has been turned back on and ramp echo was done prior to discharge in 10/18, speed increased to 5600 rpm.   He was admitted in 11/18 with nausea/vomiting and abdominal pain.  This appears to have been due to diabetic gastroparesis (extensive GI evaluation). He had atrial flutter this admission that was converted to NSR with amiodarone.  He improved considerably with scheduled Reglan and erythromycin. He was re-admitted later in 11/18 with the same symptoms, suspect diabetic gastroparesis and was again treated with erythromycin.  He was not able to start domperidone due to prolonged QT interval. He had nausea again about a week ago but was able to manage at home with Phenergan suppositories.   Admitted 1/8 -> 09/16/17 for uncontrolled vomiting in setting of diabetic gastroparesis. Improved with phenergan suppositories and given erythromycin TID for 7 days post-discharge.    He was admitted in 2/19 for AV graft placement.  While in the hospital, we turned down his speed.  Since then, he has had  considerably fewer PI events on HD days.    He was admitted in 4/19 with nausea/vomiting again, probably gastroparesis flare.   He was admitted in 5/19 with nausea/vomiting, likely gastroparesis flare, treated with erythromycin course which he is finishing.   He has been evaluated at [facility] by Dr. Derrill Kay (gastroparesis specialist).   Gastric emptying study, surprisingly, was near normal. However, electrogastrogram showed poor gastric accomodation/capacity, "bradygastria."  Frequent small meals were recommended. Also, early am nausea was thought to be possible GERD.    Admitted 6/26 - 03/19/18 for emergent VAD exchange 03/01/18 in the setting of HM3 outflow graft kink. He required CVVHD post op, but was transtioned back to [facility] without problems. Course complicated by leukocytosis and fever. CT abd/chest negative for infection and blood cultures were negative. Post op pain well controlled with rare tramadol. Able to tolerate diet. Evaluated by PT/OT and HH PT recommended for discharge. Ramp echo completed prior to discharge with speed optimization. Resumed 81 mg ASA + coumadin with INR goal 2-3.   He was admitted 03/23/18 with nausea/gastroparesis symptoms and marked HTN at  outpatient HD.  Erythromycin was restarted and gastroparesis symptoms were controlled with fall in BP and discharge on 03/25/18.   He was admitted in 9/19 for LUE AV fistula placement.  He was readmitted in 10/19 with his typical gastroparesis symptoms, unable to take po. The fistula later failed and was replaced with a graft.  However, the graft was nonfunctional and developed a hematoma at the surgical site.    He was admitted in 12/19 with nausea/vomiting from HD. Also with LDH increased to 427 in setting of extensive hematoma around his left arm AV graft.  He has had tingling in his left hand and left grip is weak.    In 7/20, he was admitted with ischemic 4th toe on right foot.  Peripheral angiogram 03/18/19 showed 80% right SFA  stenosis and occluded right PT.  He had stenting of SFA and PTCA of PT with restoration of flow.  However, he still needed amputation right 4th toe on 7/16 with intractable pain.  He was readmitted with pain in his right foot, nausea, and vomiting. There was concern for ongoing right foot ischemia with all toes and a portion of the foot now dusky.  He was treated with IV vancomycin for possible osteomyelitis/infection, but thought that more likely ischemia could explain all the findings.  He was discharged home to followup with vascular surgery.   He was re-admitted in 8/20 with ongoing right foot pain and ended up having transmetatarsal amputation of the right foot.  Left leg angiogram was done with DES to left SFA, only 1 vessel runoff via severely diseased peroneal artery, unable to intervene.   He was readmitted in 9/20 with ongoing right foot/lower leg pain and had right AKA.  He was discharged to [facility].    He was then admitted with gangrene in the remnant of his left foot in 11/20.  He had left AKA.    Patient was in [place] in 11/22 and fractured a metacarpal in his right hand.    Patient had episode of VT in 2/23 with ICD shock.  He was started on amiodarone.  He had another ICD discharge with VF in 10/23 in the setting of significant emotional stress (grandson had died).   Patient returns for followup of CHF/LVAD.  No further VT/VF, no dizziness or palpitations.  He seems to be doing well.  Wheels himself in wheelchair and transfers without dyspnea.  MAP 110 today but has not taken his hydralazine.  He has subcutaneous nodular areas in the mid right and left flanks at the sites of mild ecchymosis.   Reports taking Coumadin as prescribed and adherence to anticoagulation based dietary restrictions. Denies BRBPR or melena. No dark urine or hematuria.   Labs (11/18): hgb 10.4 => 8.7, LDH 213 Labs (08/04/17): hgb 9 Labs (1/19): HgbA1c 6.1 Labs (2/19): hgb 13.2 Labs (3/19): hgb 11.1 Labs  (7/19): hgb 9.6 Labs (10/19: hgb 11.8 Labs (12/19): LDH 427 => 237, hgb 7.7 Labs (11/20): hgb 8.1 Labs (1/21): HIV negative, RPR negative Labs (3/21): hgb 8.8 Labs (6/21): hgb 12.4 Labs (8/21): hgb 10.8, LDH 201 Labs (10/21): hgb 12.4, LDH 220 Labs (12/21): hgb 12, LDH 228 Labs (3/22): hgb 11.7 Labs (5/22): hgb 10.7, LDH 180 Labs (11/22): hgb 10.4 Labs (1/23): hgb 10.1 Labs (4/23): T4 normal, TSH mildly elevated 5.4 Labs (5/23): LDL 187, hgb 10.8 Labs (10/23): LFTs normal, TSH normal, hgb 12.2 Labs (12/23): LDH 220, hgb 11.2  PMH: 1. Chronic systolic CHF: Ischemic cardiomyopathy.  St  Jude ICD.  - Echo (11/17) with EF 25-30%, moderate diastolic dysfunction, mild MR, mildly dilated RV with moderately decreased systolic function, peak RV-RA gradient 48 mmHg.  - Echo (3/18) with EF 25-30%, wall motion abnormalities, normal RV size and systolic function.  - Admission 3/18 with cardiogenic shock and AKI, required milrinone gtt.  - Echo (8/18) with EF 20-25%, mild LVH, moderate LV dilation, mild MR.  - CPX (8/18) with peak VO2 12.4, VE/VCO2 slope 35, RER 1.05 => severe HR limitation.  - Heartmate 3 LVAD 10/18.  - Pump thrombosis in setting of HM3 outflow graft kink.  Pump exchange 6/19 for HM3.  2. CAD: Anterior MI 2005, had bifurcation stenting LAD/diagonal.  Repeat cath 2010 with patent stents.  - NSTEMI 3/18: LHC with 99% ulcerated lesion proximal RCA, 95% mid LAD => PCI with DES to proximal RCA and mid LAD.  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  - Lower extremity venous dopplers (8/18): chronic DVT - V/Q scan (8/18): Normal, no PE.  4. Crohns disease: History of colectomy.  5. GERD 6. Diabetic gastroparesis: Admission 11/18 with abdominal pain, nausea/vomiting.  - [facility] workup 2019: Gastric emptying study near normal.  Negative breath test (no evidence for small bowel bacterial overgrowth).  Electrogastrogram showed poor gastric accomodation/capacity, "bradygastria."  7. HTN 8.  Hyperlipidemia 9. Type II diabetes  10. ESRD: Post-op LVAD surgery.  He has had failed AV fistula and AV graft, now dialyzing via catheter . 11. Carotid dopplers (8/18): mild bilateral stenosis.  12. PFTs (8/18): Mild restriction (no IDL on CT chest).  13. Lung nodules: CT chest 8/18 with small bilateral nodules.  14. PAD: ABIs with moderate bilateral disease in 8/18.  - Peripheral angiogram 03/18/19 showed 80% right SFA stenosis and occluded right PT.  He had stenting of SFA and PTCA of PT with restoration of flow.  However, he still needed amputation right 4th toe on 03/21/19 with intractable pain.  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  - Peripheral angiogram left leg: DES to left SFA, only 1 vessel runoff via severely diseased peroneal artery, unable to intervene. 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  - Left AKA 11/20.  15. Atrial flutter: In 11/18 associated with nausea/vomiting from gastroparesis.  16. VT: VT with ICD shock in 11/22.  - VT with ICD shock in 2/23, amiodarone started.  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 Take 200 mg twice a day for 10 days (starting 06/13/22), then resume 200 mg daily. 90 tablet 3   aspirin EC 81 MG tablet Take 1 tablet (81 mg total) by mouth daily. 90 tablet 3   atorvastatin (LIPITOR) 40 MG tablet Take 1 tablet (40 mg total) by mouth daily at 6 PM. 90 tablet 3   calcitRIOL (ROCALTROL) 0.5 MCG capsule Take 1 capsule (0.5 mcg total) by mouth every Monday, Wednesday, and Friday. 15 capsule 5   cefadroxil (DURICEF) 500 MG capsule Take 2 capsules (1,000 mg total) by mouth 3 (three) times a week. Take AFTER dialysis sessions 24 capsule 11   citalopram (CELEXA) 20 MG tablet Take 1 tablet (20 mg total) by mouth daily. 30 tablet 6   hydrALAZINE (APRESOLINE) 25 MG tablet Take 1 tablet three times a day on NON-dialysis days  only 270 tablet 3   insulin glargine (LANTUS) 100 UNIT/ML injection Inject 0.35 mLs (35 Units total) into the  skin every morning. 3 mL 11   metoCLOPramide (REGLAN) 10 MG tablet Take 1 tablet (10 mg total) by mouth 3 (three) times daily. (Patient taking differently: Take 10 mg by mouth 3 (three) times daily before meals.) 90 tablet 6   multivitamin (RENA-VIT) TABS tablet Take 1 tablet by mouth daily.     oxyCODONE-acetaminophen (PERCOCET) 7.5-325 MG tablet Take 1 tablet by mouth 3 (three) times daily as needed for pain.     pantoprazole (PROTONIX) 40 MG tablet Take 1 tablet (40 mg total) by mouth 2 (two) times daily before lunch and supper. 60 tablet 11   sevelamer carbonate (RENVELA) 0.8 g PACK packet Take 0.8 g by mouth 3 (three) times daily with meals. 270 each 1   triamcinolone ointment (KENALOG) 0.1 % Apply 1 Application topically 2 (two) times daily. 30 g 0   warfarin (COUMADIN) 5 MG tablet TAKE 1 TABLET BY MOUTH EVERY DAY 30 tablet 11   LORazepam (ATIVAN) 0.5 MG tablet Take 1 tablet (0.5 mg total) by mouth every 12 (twelve) hours as needed (nausea). (Patient not taking: Reported on 09/06/2022) 60 tablet 0   [START ON 09/07/2022] midodrine (PROAMATINE) 5 MG tablet Take 2 tablets (10 mg total) by mouth every Monday, Wednesday, and Friday. Take 1 tablet before dialysis treatment 70 tablet 6   nitroGLYCERIN (NITROSTAT) 0.4 MG SL tablet Place 1 tablet (0.4 mg total) under the tongue every 5 (five) minutes as needed for chest pain. 90 tablet 3   ondansetron (ZOFRAN-ODT) 4 MG disintegrating tablet Take by mouth as needed. (Patient not taking: Reported on 09/06/2022)     promethazine (PHENERGAN) 12.5 MG tablet TAKE 1 TABLET (12.5 MG TOTAL) BY MOUTH EVERY 6 (SIX) HOURS AS NEEDED FOR NAUSEA OR VOMITING. (Patient not taking: Reported on 09/06/2022) 30 tablet 3   No current facility-administered medications for this encounter.    Metformin and related   Review of systems complete and found to be negative  unless listed in HPI.    BP (!) 140/112 Comment: map 122; has not taken meds this morning  Pulse 90   SpO2 100%     VAD interrogation & Equipment Management: See LVAD nurse's note above.   General: Well appearing this am. NAD.  HEENT: Normal. Neck: Supple, JVP 7-8 cm. Carotids OK.  Cardiac:  Mechanical heart sounds with LVAD hum present.  Lungs:  CTAB, normal effort.  Abdomen:  NT, ND, no HSM. No bruits or masses. +BS  LVAD exit site: Well-healed and incorporated. Dressing dry and intact. No erythema or drainage. Stabilization device present and accurately applied. Driveline dressing changed daily per sterile technique. Extremities: S/p bilateral AKAs Neuro:  Alert & oriented x 3. Cranial nerves grossly intact. Moves all 4 extremities w/o difficulty. Affect pleasant    Skin: Small subcutaneous nodules with overlying ecchymosis right and left mid flanks.   ASSESSMENT AND PLAN: 1. PAD/ischemic feet:  He is now s/p bilateral AKAs.  Able to wheel his chair and do transfers without dyspnea.  2. Chronic systolic CHF: Ischemic cardiomyopathy.  St Jude ICD.  NSTEMI in March 2018 with DES to LAD and RCA, complicated by cardiogenic shock, low output requiring milrinone.  Echo 8/18 with EF 20-25%, moderate MR. CPX (8/18) with severe functional impairment due to HF.  He is s/p HM3 LVAD placement. He had pump thrombosis 6/19 due to [facility] outflow graft kinking.  He had pump exchange for another HM3.  LVAD parameters stable but he does have PI events and low flow  events at times with HD. He takes midodrine pre-HD, but needs hydralazine on non-HD days.  MAP high today but has not had hydralazine today.   - I think he can decrease midodrine to 10 mg pre-HD with BP generally running high. Can titrate down further as tolerated.    - Volume managed at dialysis, MWF.  Would be careful to avoid excess UF given LVAD.  - Continue ASA 81 and warfarin with INR goal 2-2.5.    - Continue hydralazine 25 mg tid on non-HD  days. - He is not candidate for transplant now given extensive PAD with amputations.   3. ESRD: HD on M-W-F with midodrine pre-HD.  - Need to allow UF as long as MAP > 70 (would not stop for MAP 70-80).   4.  CAD: NSTEMI in 3/18.  LHC with 99% ulcerated lesion proximal RCA with left to right collaterals, 95% mid LAD stenosis after mid LAD stent.  s/p PCI to RCA and LAD on 11/25/16.  - Continue statin and ASA.  5. h/o DVTs: Factor V Leiden heterozygote. - Anticoagulated. 6. Diabetic gastroparesis: He has seen gastroparesis specialist at [facility]. Surprisingly, gastric emptying study was normal. However, electrogastrogram showed poor gastric accomodation/capacity. Therefore, small frequent meals recommended.  He has been doing much better with this, minimal symptoms.  - Continue reglan 10 mg tid/ac, now off Marinol.  - Continue Protonix bid.  - Limit narcotic use as much as possible.  7. HTN: Takes hydralazine 25 tid on non-HD days. MAP high today but has not had hydralazine.  - Take hydralazine as soon as you get home.  - Decrease midodrine to 10 mg pre-HD as above.  8. Chronic pain: Now seen in a pain clinic.  9. Atrial flutter: Paroxysmal.  Not seen recently.  10. VT: He was started on amiodarone after 2/23 ICD shock. VF again with shock in 10/23 in setting of extreme stress. No further events.  - Continue amiodarone 200 mg daily.  I will check LFTs and TSH.  He will need regular eye exam.   - he does not drive.  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  - Will continue cefadroxil long-term (three days/week after HD).  This will be followed by ID.   Followup in 2 months.   [name] 09/06/2022

## 2022-09-06 NOTE — Patient Instructions (Signed)
Decrease Midodrine to 10 mg on HD days. If you have recurrent low flows notify VAD coordinators Coumadin dosing per [place] PharmD Return to clinic in 2 months for follow up with Dr Aundra Dubin

## 2022-09-09 ENCOUNTER — Ambulatory Visit (INDEPENDENT_AMBULATORY_CARE_PROVIDER_SITE_OTHER): Payer: Medicare Other

## 2022-09-09 DIAGNOSIS — I255 Ischemic cardiomyopathy: Secondary | ICD-10-CM | POA: Diagnosis not present

## 2022-09-09 LAB — CUP PACEART REMOTE DEVICE CHECK
Battery Remaining Longevity: 40 mo
Battery Remaining Percentage: 43 %
Battery Voltage: 2.9 V
Brady Statistic AP VP Percent: 1 %
Brady Statistic AP VS Percent: 2 %
Brady Statistic AS VP Percent: 1 %
Brady Statistic AS VS Percent: 97 %
Brady Statistic RA Percent Paced: 1 %
Brady Statistic RV Percent Paced: 1 %
Date Time Interrogation Session: 20240105020019
HighPow Impedance: 58 Ohm
HighPow Impedance: 58 Ohm
Implantable Lead Connection Status: 753985
Implantable Lead Connection Status: 753985
Implantable Lead Implant Date: 20100430
Implantable Lead Implant Date: 20100430
Implantable Lead Location: 753859
Implantable Lead Location: 753860
Implantable Lead Model: 7121
Implantable Pulse Generator Implant Date: 20180110
Lead Channel Impedance Value: 350 Ohm
Lead Channel Impedance Value: 690 Ohm
Lead Channel Pacing Threshold Amplitude: 1 V
Lead Channel Pacing Threshold Amplitude: 1.25 V
Lead Channel Pacing Threshold Pulse Width: 0.5 ms
Lead Channel Pacing Threshold Pulse Width: 0.5 ms
Lead Channel Sensing Intrinsic Amplitude: 1 mV
Lead Channel Sensing Intrinsic Amplitude: 12 mV
Lead Channel Setting Pacing Amplitude: 2.5 V
Lead Channel Setting Pacing Amplitude: 2.5 V
Lead Channel Setting Pacing Pulse Width: 0.5 ms
Lead Channel Setting Sensing Sensitivity: 0.5 mV
Pulse Gen Serial Number: 7383809
Zone Setting Status: 755011

## 2022-09-13 ENCOUNTER — Ambulatory Visit (HOSPITAL_COMMUNITY): Payer: Self-pay | Admitting: Pharmacist

## 2022-09-13 LAB — POCT INR: INR: 2.4 (ref 2.0–3.0)

## 2022-09-15 ENCOUNTER — Telehealth (HOSPITAL_COMMUNITY): Payer: Self-pay | Admitting: Unknown Physician Specialty

## 2022-09-15 ENCOUNTER — Telehealth: Payer: Self-pay

## 2022-09-15 DIAGNOSIS — Z95811 Presence of heart assist device: Secondary | ICD-10-CM

## 2022-09-15 MED ORDER — AMIODARONE HCL 200 MG PO TABS: ORAL_TABLET | ORAL | 3 refills | Status: DC

## 2022-09-15 NOTE — Telephone Encounter (Signed)
Received msg this morning from DR [facility] and Device clinic nurse stating pt had VT run with some ATP bursts. No shocks delivered but VT slowed with ATP.  Pt has been scheduled with EP team on 1/23. Per Dr Aundra Dubin, [name] was instructed to increase Amiodarone to 200 mg bid for 10 days and then resume a dose of 200 daily. Pt verbalized understanding and will make the change to his medications.  [name] RN, BSN VAD Coordinator 24/7 Pager [phone]

## 2022-09-15 NOTE — Telephone Encounter (Signed)
Alert received from [employer]:  Alert remote reviewed. Normal device function.   There was one NSVT arrhythmia detected and one VT episode that delivered two bursts of ATP but did not break the VT.  The VT did slow down.  Sent to triage.   Discussed with Dr. Aundra Dubin.  HF to  increase amiodarone.  Request received for ASAP follow up with EP APP.  Will schedule Pt.  Presenting rhythm from 09/15/2022 at 4:00 am:

## 2022-09-20 ENCOUNTER — Ambulatory Visit (HOSPITAL_COMMUNITY): Payer: Self-pay | Admitting: Pharmacist

## 2022-09-20 LAB — POCT INR: INR: 2.1 (ref 2.0–3.0)

## 2022-09-23 NOTE — Progress Notes (Unsigned)
Electrophysiology Office Note Date: 09/27/2022  ID:  [name], [name] [birth year], MRN [id number]  PCP: Precious Gilding, DO Primary Cardiologist: None Electrophysiologist: Virl Axe, MD   CC: Routine ICD follow-up  [name] is a 57 y.o. male seen today for Virl Axe, MD for acute visit due to VT on ICD with appropriate therapy.    Patient reports doing well overall.  Pt was not particularly aware that anything different happened on day of ATP.  Potentially noticed some fatigue in hindsight. Fluid well controlled via M/W/F HD. No chest pain, palpitations, new dyspnea, PND, orthopnea, nausea, vomiting, dizziness, syncope, edema, weight gain, or early satiety.   Device History: [employer], [employer] ICD implanted 12/2008, gen change 2018 for CHF History of appropriate therapy: Yes History of AAD therapy: Yes; currently on amiodarone    Past Medical History:  Diagnosis Date   Angina    ASCVD (arteriosclerotic cardiovascular disease)    , Anterior infarction 2005, LAD diagonal bifurcation intervention 03/2004   Automatic implantable cardiac defibrillator -St. 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-on Coumadin   Dialysis patient ([place])    Dyspnea    Gastroparesis    GERD (gastroesophageal reflux disease)    High cholesterol    Hyperlipidemia    Hypersomnolent    Previous diagnosis of narcolepsy   Hypertension, essential    Ischemic cardiomyopathy    Ejection fraction 15-20% catheterization 2010   Type II or unspecified type diabetes mellitus without mention of complication, not stated as uncontrolled    Unspecified gastritis and gastroduodenitis without mention of hemorrhage     Current Outpatient Medications   Medication Instructions   amiodarone (PACERONE) 200 MG tablet Take 200 mg twice a day for 10 days (starting 09/15/22), then resume 200 mg daily.   aspirin EC 81 mg, Oral, Daily   atorvastatin (LIPITOR) 40 mg, Oral, Daily-1800   calcitRIOL (ROCALTROL) 0.5 mcg, Oral, Every M-W-F   cefadroxil (DURICEF) 1,000 mg, Oral, 3 times weekly, Take AFTER dialysis sessions   citalopram (CELEXA) 20 mg, Oral, Daily   Colace 100 mg, Oral, Daily PRN   hydrALAZINE (APRESOLINE) 25 MG tablet Take 1 tablet three times a day on NON-dialysis days only   insulin glargine (LANTUS) 35 Units, Subcutaneous, BH-each morning   LORazepam (ATIVAN) 0.5 mg, Oral, Every 12 hours PRN   metoCLOPramide (REGLAN) 10 mg, Oral, 3 times daily   midodrine (PROAMATINE) 10 mg, Oral, Every M-W-F, Take 1 tablet before dialysis treatment   multivitamin (RENA-VIT) TABS tablet 1 tablet, Oral, Daily   nitroGLYCERIN (NITROSTAT) 0.4 mg, Sublingual, Every 5 min PRN   ondansetron (ZOFRAN-ODT) 4 MG disintegrating tablet Oral, As needed   oxyCODONE-acetaminophen (PERCOCET) 7.5-325 MG tablet 1 tablet, Oral, 3 times daily PRN   pantoprazole (PROTONIX) 40 mg, Oral, 2 times daily before lunch and supper   promethazine (PHENERGAN) 12.5 mg, Oral, Every 6 hours PRN   sevelamer carbonate (RENVELA) 0.8 g, Oral, 3 times daily with meals   triamcinolone ointment (KENALOG) 0.1 % 1 Application, Topical, 2 times daily   warfarin (COUMADIN) 5 MG tablet TAKE 1 TABLET BY MOUTH EVERY DAY    Family History:  Family History  Problem Relation Age of Onset   Colon polyps Mother    Diabetes Mother    Heart attack Father    Diabetes Father    Stroke Paternal Grandfather    Colitis Sister    Heart disease Brother    Colitis Sister    Colon cancer Neg Hx     Physical Exam: Vitals:   09/27/22 1232  Pulse: 84  SpO2: 97%  Height: '5\' 7"'$  (1.702 m)     GEN- NAD. A&O x 3. Normal affect. HEENT: Normocephalic, atraumatic Lungs- CTAB, Normal effort.  Heart-  LVAD hum Extremities- Bilateral AKA  Skin- warm and dry, no rash or lesion; ICD pocket well healed  ICD interrogation- reviewed in detail today,  See PACEART report  Other studies Reviewed: Additional studies/ records that were reviewed today include: Previous EP office notes.   Assessment and Plan:  1.  Chronic systolic dysfunction s/p St. Jude dual chamber ICD  2. ICM 3. LVAD in place euvolemic today Stable on an appropriate medical regimen Normal ICD function See Pace Art report No changes today  3. NSVT/VT Recent episodes on device treated with ATP.  Continue amiodarone with gradual taper to 200 mg daily per Dr. Aundra Dubin Will request amio labs at next HD session.  No device change today as ATP successfully slowed VT which spontaneously converted. Will consider creating an ATP only zone below 200 bpm if recurs.   5. ESRD Recent labs through HD  Current medicines are reviewed at length with the patient today.    Labs/ tests ordered today include:  Orders Placed This Encounter  Procedures   Comp Met (CMET)   TSH   T4, free   CUP PACEART INCLINIC DEVICE CHECK    Disposition:   Follow up with Dr. Caryl Comes in 6 months  Signed, Shirley Friar, PA-C  09/27/2022 12:55 PM  [facility] [name] [place] [address] [phone] (office) [phone] (fax)

## 2022-09-27 ENCOUNTER — Encounter: Payer: Self-pay | Admitting: Student

## 2022-09-27 ENCOUNTER — Ambulatory Visit (HOSPITAL_COMMUNITY): Payer: Self-pay | Admitting: Pharmacist

## 2022-09-27 ENCOUNTER — Other Ambulatory Visit: Payer: Self-pay | Admitting: *Deleted

## 2022-09-27 ENCOUNTER — Ambulatory Visit: Payer: Medicare Other | Attending: Student | Admitting: Student

## 2022-09-27 VITALS — HR 84 | Ht 67.0 in

## 2022-09-27 DIAGNOSIS — I255 Ischemic cardiomyopathy: Secondary | ICD-10-CM

## 2022-09-27 DIAGNOSIS — I5022 Chronic systolic (congestive) heart failure: Secondary | ICD-10-CM | POA: Diagnosis not present

## 2022-09-27 DIAGNOSIS — I472 Ventricular tachycardia, unspecified: Secondary | ICD-10-CM

## 2022-09-27 LAB — CUP PACEART INCLINIC DEVICE CHECK
Battery Remaining Longevity: 44 mo
Brady Statistic RA Percent Paced: 0.41 %
Brady Statistic RV Percent Paced: 0.33 %
Date Time Interrogation Session: 20240123124951
HighPow Impedance: 59.9019
Implantable Lead Connection Status: 753985
Implantable Lead Connection Status: 753985
Implantable Lead Implant Date: 20100430
Implantable Lead Implant Date: 20100430
Implantable Lead Location: 753859
Implantable Lead Location: 753860
Implantable Lead Model: 7121
Implantable Pulse Generator Implant Date: 20180110
Lead Channel Impedance Value: 350 Ohm
Lead Channel Impedance Value: 687.5 Ohm
Lead Channel Pacing Threshold Amplitude: 0.75 V
Lead Channel Pacing Threshold Amplitude: 0.75 V
Lead Channel Pacing Threshold Amplitude: 1.25 V
Lead Channel Pacing Threshold Amplitude: 1.25 V
Lead Channel Pacing Threshold Pulse Width: 0.5 ms
Lead Channel Pacing Threshold Pulse Width: 0.5 ms
Lead Channel Pacing Threshold Pulse Width: 0.5 ms
Lead Channel Pacing Threshold Pulse Width: 0.5 ms
Lead Channel Sensing Intrinsic Amplitude: 0.9 mV
Lead Channel Sensing Intrinsic Amplitude: 11.8 mV
Lead Channel Setting Pacing Amplitude: 2.5 V
Lead Channel Setting Pacing Amplitude: 2.5 V
Lead Channel Setting Pacing Pulse Width: 0.5 ms
Lead Channel Setting Sensing Sensitivity: 0.5 mV
Pulse Gen Serial Number: 7383809
Zone Setting Status: 755011

## 2022-09-27 LAB — POCT INR: INR: 2.6 (ref 2.0–3.0)

## 2022-09-27 NOTE — Patient Instructions (Signed)
Medication Instructions:   Your physician recommends that you continue on your current medications as directed. Please refer to the Current Medication list given to you today.   *If you need a refill on your cardiac medications before your next appointment, please call your pharmacy*   Lab Work:  CMET/TSH/FREE T4 AT DIALYSIS.   If you have labs (blood work) drawn today and your tests are completely normal, you will receive your results only by: [place] (if you have MyChart) OR A paper copy in the mail If you have any lab test that is abnormal or we need to change your treatment, we will call you to review the results.   Testing/Procedures:  None ordered.   Follow-Up: At [facility], you and your health needs are our priority.  As part of our continuing mission to provide you with exceptional heart care, we have created designated Provider Care Teams.  These Care Teams include your primary Cardiologist (physician) and Advanced Practice Providers (APPs -  Physician Assistants and Nurse Practitioners) who all work together to provide you with the care you need, when you need it.  We recommend signing up for the patient portal called "MyChart".  Sign up information is provided on this After Visit Summary.  MyChart is used to connect with patients for Virtual Visits (Telemedicine).  Patients are able to view lab/test results, encounter notes, upcoming appointments, etc.  Non-urgent messages can be sent to your provider as well.   To learn more about what you can do with MyChart, go to [email or web address].    Your next appointment:   6 month(s)  Provider:   Virl Axe, MD    Other Instructions  Your physician wants you to follow-up in: 6 months with Dr. Caryl Comes.  You will receive a reminder letter in the mail two months in advance. If you don't receive a letter, please call our office to schedule the follow-up appointment.

## 2022-09-27 NOTE — Progress Notes (Signed)
Remote ICD transmission.   

## 2022-10-04 ENCOUNTER — Ambulatory Visit (HOSPITAL_COMMUNITY): Payer: Self-pay | Admitting: Pharmacist

## 2022-10-04 LAB — POCT INR: INR: 2.1 (ref 2.0–3.0)

## 2022-10-11 ENCOUNTER — Ambulatory Visit (HOSPITAL_COMMUNITY): Payer: Self-pay | Admitting: Pharmacist

## 2022-10-11 LAB — POCT INR: INR: 2.3 (ref 2.0–3.0)

## 2022-10-18 ENCOUNTER — Ambulatory Visit (HOSPITAL_COMMUNITY): Payer: Self-pay | Admitting: Pharmacist

## 2022-10-18 LAB — POCT INR: INR: 2.1 (ref 2.0–3.0)

## 2022-10-25 ENCOUNTER — Ambulatory Visit (HOSPITAL_COMMUNITY)
Admission: RE | Admit: 2022-10-25 | Discharge: 2022-10-25 | Disposition: A | Discharge status: Home / Self Care | Payer: Medicare Other | Source: Ambulatory Visit | Attending: Cardiology | Admitting: Cardiology

## 2022-10-25 ENCOUNTER — Ambulatory Visit (HOSPITAL_COMMUNITY): Payer: Self-pay | Admitting: Pharmacist

## 2022-10-25 ENCOUNTER — Other Ambulatory Visit (HOSPITAL_COMMUNITY): Payer: Self-pay | Admitting: *Deleted

## 2022-10-25 DIAGNOSIS — X58XXXA Exposure to other specified factors, initial encounter: Secondary | ICD-10-CM | POA: Insufficient documentation

## 2022-10-25 DIAGNOSIS — Z4801 Encounter for change or removal of surgical wound dressing: Secondary | ICD-10-CM | POA: Diagnosis not present

## 2022-10-25 DIAGNOSIS — Z95811 Presence of heart assist device: Secondary | ICD-10-CM

## 2022-10-25 DIAGNOSIS — T827XXA Infection and inflammatory reaction due to other cardiac and vascular devices, implants and grafts, initial encounter: Secondary | ICD-10-CM | POA: Diagnosis not present

## 2022-10-25 LAB — POCT INR: INR: 2.3 (ref 2.0–3.0)

## 2022-10-25 NOTE — Patient Instructions (Addendum)
Change drive line dressing every day. Place small amount of VASHE solution moistened 2 x 2 gauze at exit site Do not miss any doses of by mouth antibiotics. We will arrange for IV antibiotics x 3 weeks. When taking IV antibiotics you do not have to take by mouth antibiotics.  We will arrange CT scan of your chest/abdomen/pelvis

## 2022-10-25 NOTE — Progress Notes (Signed)
Received call from pt yesterday afternoon reporting increased drainage and pain at exit site. He was also out of dressing kits and had not changed his dressing since Saturday. Advised to come to clinic for assessment.   Patient presents to clinic via wheelchair with his son. Denies issues with VAD equipment. Pt's son transferred pt to stretcher.   Dr Prescott Gum at bedside to assess pt. See wound care documentation below. Will have [name] change dressing daily using CHG swabs for cleansing with VASHE solution moistened 2 x 2 at exit site.   Denies trauma with transfers. Denies dropping equipment. Reports pain at exit site with certain positions while turning, bending over, and coughing that started last Thursday. States Margreta Journey feels that drainage has increased since last week. Pt reports daughter has been cleansing site with CHG and an alcohol based solution. Advised cessation of alcohol based solution. He verbalized understanding.   Small triangular area of induration noted proximal to exit site. Mild tenderness with palpation. Wound cultured today. Per Dr Prescott Gum will obtain CT chest/abd/pelvis.   Pt currently taking Cefadroxil 1000 mg following HD treatments 3 x per week. Pt states he takes "the best he can" but admits to missing doses. Per Dr Prescott Gum will resume IV Cefazolin 2 gm after HD sessions x 3 weeks. Pt does not need to take PO Cefadroxil while receiving IV antibiotics. Spoke with Jen Mow PA at [facility] regarding need for IV antibiotic. She will place orders for the above.   Exit Site Care: Drive line is being maintained every other day by daughter. He is not wearing an anchor- refuses to wear. Pt asked today if having an anchor in place would prevent further infection/trauma. Encouraged him to always wear an anchor to prevent trauma. He is open to trying to wear an anchor again. He allowed VAD coordinator to apply anchor today.   Wound care per Dr Prescott Gum. Existing VAD dressing removed and site care performed using sterile technique. Drive line exit site cleaned with hypochlorous spray and allowed to dry. Then site cleansed with Chlora prep applicators x 2, allowed to dry, then rinsed with saline, and allowed to dry. NO silver strip applied. VASHE solution moistened 2 x 2 placed at exit site. Wound culture obtained. Site tunnels 3 cm. Following culture exit site tunneling bleeding noted. Silver nitrate swab x 2 to tunnel per Dr Prescott Gum; hemostasis achieved. Hypergranulation tissue noted at exit site. Moderate amount of thick sticky brown drainage noted on gauze and at exit site. Unable to express drainage from site. No redness, rash, or foul odor noted. Site mildly tender with cleansing. Small triangular area of induration noted proximal to exit site. Cath grip anchor device applied over mepitel tape to reduce itchiness from adhesive per pt request. Pt denies fever or chills. Advised to change dressing daily. Pt provided with 14 daily kits, 6 anchors, VASHE solution, and mepitel tape for home use.     Plan: Change drive line dressing every day. Place small amount of VASHE solution moistened 2 x 2 gauze at exit site We will arrange for IV antibiotics x 3 weeks following HD. When taking IV antibiotics you do not have to take by mouth antibiotics.  We will arrange CT scan of your chest/abdomen/pelvis  Return to VAD clinic in 1 week for follow up visit with Dr Dwyane Dee RN [place] Coordinator  Office: [phone]  24/7 Pager: [phone]

## 2022-10-26 ENCOUNTER — Telehealth: Payer: Self-pay | Admitting: *Deleted

## 2022-10-26 NOTE — Telephone Encounter (Signed)
Pt scheduled for CT chest/abd/pelvis without contrast 11/01/22 at 0930. No precert required.   Called and left voicemail for patient with the above appt information. Requested call back to clinic to confirm he received message.   [name] RN [place] Coordinator  Office: [phone]  24/7 Pager: [phone]

## 2022-10-27 ENCOUNTER — Encounter (HOSPITAL_COMMUNITY): Payer: Self-pay

## 2022-10-27 ENCOUNTER — Telehealth: Payer: Self-pay

## 2022-10-27 DIAGNOSIS — Z95811 Presence of heart assist device: Secondary | ICD-10-CM

## 2022-10-27 MED ORDER — AMIODARONE HCL 200 MG PO TABS: ORAL_TABLET | ORAL | 3 refills | Status: DC

## 2022-10-27 NOTE — Telephone Encounter (Signed)
Increase amiodarone to 200 mg bid x 1 week then 200 daily again.

## 2022-10-27 NOTE — Telephone Encounter (Signed)
VAD team alerted by device clinic of sustained VT for [name]. Called pt at home and instructed him to take Amiodarone 200 mg twice daily for 7 days and then resume his daily dose of 200 mg per Dr Aundra Dubin. Pt verbalized understanding of all instructions.   [name] RN, BSN VAD Coordinator 24/7 Pager [phone]

## 2022-10-27 NOTE — Telephone Encounter (Addendum)
Alert received from [employer]:  Device alert VT/VF episode occurred 7 NSVT episodes, 5 from 2/21 2 VT-1 episodes 2/21 @ 14:14 and 14:17, duration 24sec-35mn 20sec, HR's 150's falling in to the monitor zone No therapy has occurred, [place] stable  Outreach made to Pt.  He states he was napping during this time.   He states he is taking amiodarone 200 mg one tablet by mouth daily at this time.  Advised would send to Dr. KCaryl Comesand Dr. MAundra Dubinfor advisement.

## 2022-10-27 NOTE — Progress Notes (Signed)
Patient returned phone call this morning to confirm appointment time for CT scan next Tuesday 2/27 at 9:30. Instructed patient on directions for radiology check and to return to clinic following scan for drive line check with Dr. Darcey Nora.  Bobbye Morton RN, BSN VAD Coordinator 24/7 Pager [phone]

## 2022-10-27 NOTE — Telephone Encounter (Signed)
Pt also has follow up scheduled next Tuesday with RU per SK.  Pt aware of appt.

## 2022-10-28 LAB — AEROBIC CULTURE W GRAM STAIN (SUPERFICIAL SPECIMEN)

## 2022-10-30 NOTE — Progress Notes (Unsigned)
Cardiology Office Note Date:  11/01/2022  Patient ID:  [name], [name] [birth year], MRN [id number] PCP:  Precious Gilding, DO  Cardiologist:  Dr. Aundra Dubin Electrophysiologist: Dr. Caryl Comes    Chief Complaint: VT  History of Present Illness: [name] is a 57 y.o. male with history of CAD (STEMI 2018 PCI to LAD, RCA), ICM, chronic CHF, ICD, LVAD (this is destination therapy, no longer transplant candidate), PVD hx of b/l AKA, ESRF on HD, recurrent DVTs, DM, diabetic gastroparesis, HLD, DM, AFlutter   He last saw Dr. Caryl Comes March 2022.  Following closely with the HF team. Saw Dr. Aundra Dubin 09/06/22, chronic drive line infection, follows with ID as well, chronic pain syndrome follows with pain clinic, HTN with hypotension on midodrine on HD days  He saw A. Tillery, PA-C 09/27/22, added on for recent ATP therapy.  Pt was unaware, no new symptoms of late, perhaps more fatigued. ATP slowed his VT then had spontaneous CV  Planned for labs No changes to his device were made, discussed perhaps adding an ATP only zone below 200 if recurrent.  Device clinic alert note 2/22 for NSVTs and sustained VT in the monitor zone, no therapies. Via Dr. Nat Christen, amiodarone to '200mg'$  BID for a week then resume daily  TODAY He is accompanied by his son. He is doing "fine" Has a dry cough that he reports as chronic No CP, palpitations, SOB. He will occasionally have a sense of "unease" in his chest, but no symptoms of heaviness, pain, none of his heart attack symptoms. No syncope He would have been asleep at the time of his VTs  His HD line is L IJ, not ideal, he reports b/l UE nonfunctioning fistulas, unclear when his last line on the left was  Device information Abbott dual chamber ICD implanted 01/02/09, gen change 09/14/16 + appropriate therapies Amiodarone started Feb 2023   Past Medical History:  Diagnosis Date   Angina    ASCVD (arteriosclerotic cardiovascular disease)    , Anterior  infarction 2005, LAD diagonal bifurcation intervention 03/2004   Automatic implantable cardiac defibrillator -St. Jude's        Benign neoplasm of colon    CHF (congestive heart failure) (HCC)    Chronic systolic heart failure ([place])    Coronary artery disease     Widely patent previously placed stents in the left anterior    Crohn's disease ([place])    Deep venous thrombosis ([place])    Recurrent-on Coumadin   Dialysis patient ([place])    Dyspnea    Gastroparesis    GERD (gastroesophageal reflux disease)    High cholesterol    Hyperlipidemia    Hypersomnolent    Previous diagnosis of narcolepsy   Hypertension, essential    Ischemic cardiomyopathy    Ejection fraction 15-20% catheterization 2010   Type II or unspecified type diabetes mellitus without mention of complication, not stated as uncontrolled    Unspecified gastritis and gastroduodenitis without mention of hemorrhage     Past Surgical History:  Procedure Laterality Date   A/V SHUNTOGRAM N/A 05/24/2018   Procedure: A/V SHUNTOGRAM;  Surgeon: Marty Heck, MD;  Location: [facility];  Service: Cardiovascular;  Laterality: N/A;   ABDOMINAL AORTOGRAM W/LOWER EXTREMITY N/A 03/18/2019   Procedure: ABDOMINAL AORTOGRAM W/LOWER EXTREMITY;  Surgeon: Waynetta Sandy, MD;  Location: [facility];  Service: Cardiovascular;  Laterality: N/A;   AMPUTATION Right 03/21/2019   Procedure: AMPUTATION DIGIT RIGHT FOURTH TOE;  Surgeon:  Waynetta Sandy, MD;  Location: [place];  Service: Vascular;  Laterality: Right;   AMPUTATION Left 05/29/2019   Procedure: AMPUTATION LEFT GREAT TOE;  Surgeon: Serafina Mitchell, MD;  Location: [place];  Service: Vascular;  Laterality: Left;   AMPUTATION Right 06/02/2019   Procedure: ABOVE KNEE AMPUTATION;  Surgeon: Rosetta Posner, MD;  Location: [facility];  Service: Vascular;  Laterality: Right;   AMPUTATION Left 07/23/2019   Procedure: AMPUTATION ABOVE KNEE LEFT;  Surgeon: Elam Dutch,  MD;  Location: [place];  Service: Vascular;  Laterality: Left;   APPENDECTOMY     AV FISTULA PLACEMENT Right 06/26/2017   Procedure: ARTERIOVENOUS (AV) FISTULA CREATION VERSUS GRAFT RIGHT  ARM;  Surgeon: Conrad Las Quintas Fronter[place]as, MD;  Location: [facility];  Service: Vascular;  Laterality: Right;   AV FISTULA PLACEMENT Right 10/31/2017   Procedure: INSERTION OF ARTERIOVENOUS (AV) ARTEGRAFT,  RIGHT UPPER ARM;  Surgeon: Conrad Williamson, MD;  Location: [facility];  Service: Vascular;  Laterality: Right;   AV FISTULA PLACEMENT Left 05/29/2018   Procedure: ARTERIOVENOUS (AV) FISTULA CREATION  LEFT UPPER EXTREMITY;  Surgeon: Waynetta Sandy, MD;  Location: [place];  Service: Vascular;  Laterality: Left;   AV FISTULA PLACEMENT Left 08/09/2018   Procedure: INSERTION OF ARTERIOVENOUS (AV) GORE-TEX GRAFT LEFT UPPER ARM;  Surgeon: Waynetta Sandy, MD;  Location: [place];  Service: Vascular;  Laterality: Left;   CARDIAC DEFIBRILLATOR PLACEMENT  2010   St. Jude ICD   CENTRAL LINE INSERTION Right 05/24/2018   Procedure: CENTRAL LINE INSERTION;  Surgeon: Marty Heck, MD;  Location: [facility];  Service: Cardiovascular;  Laterality: Right;   COLECTOMY  ~ 2003   "for Crohn's" ascending colon.     CORONARY STENT INTERVENTION N/A 11/25/2016   Procedure: Coronary Stent Intervention;  Surgeon: Leonie Man, MD;  Location: [facility];  Service: Cardiovascular;  Laterality: N/A;   EP IMPLANTABLE DEVICE N/A 09/14/2016   Procedure: ICD Generator Changeout;  Surgeon: Deboraha Sprang, MD;  Location: [name];  Service: Cardiovascular;  Laterality: N/A;   ESOPHAGOGASTRODUODENOSCOPY N/A 07/12/2017   Procedure: ESOPHAGOGASTRODUODENOSCOPY (EGD);  Surgeon: Jerene Bears, MD;  [facility];  Service: Gastroenterology;  Laterality: N/A;  VAD pt so VAD team will need to accompany pt.    FETAL SURGERY FOR CONGENITAL HERNIA     ????? pt has no knowledge of this.[name]     FISTULOGRAM Left 08/09/2018    Procedure: FISTULOGRAM LEFT ARM;  Surgeon: Waynetta Sandy, MD;  Location: [facility];  Service: Vascular;  Laterality: Left;   INGUINAL HERNIA REPAIR     INSERTION OF DIALYSIS CATHETER Right 06/26/2017   Procedure: INSERTION OF TUNNELED  DIALYSIS CATHETER RIGHT INTERNAL JUGULAR;  Surgeon: Conrad [place], MD;  Location: [place];  Service: Vascular;  Laterality: Right;   INSERTION OF IMPLANTABLE LEFT VENTRICULAR ASSIST DEVICE N/A 06/12/2017   Procedure: INSERTION OF IMPLANTABLE LEFT VENTRICULAR ASSIST [place] 3;  Surgeon: Ivin Poot, MD;  Location: [place];  Service: Open Heart Surgery;  Laterality: N/A;  HM3 LVAD  CIRC ARREST  NITRIC OXIDE   INSERTION OF IMPLANTABLE LEFT VENTRICULAR ASSIST DEVICE N/A 02/28/2018   Procedure: REDO INSERTION OF IMPLANTABLE LEFT VENTRICULAR ASSIST DEVICE - HEARTMATE 3;  Surgeon: Ivin Poot, MD;  Location: [place];  Service: Open Heart Surgery;  Laterality: N/A;   IR FLUORO GUIDE CV LINE RIGHT  07/22/2019   IR REMOVAL TUN CV CATH W/O  FL  02/26/2018   IR THORACENTESIS ASP PLEURAL SPACE W/IMG GUIDE  06/28/2017   IR US GUIDE VASC ACCESS RIGHT  07/22/2019   LIGATION OF ARTERIOVENOUS  FISTULA Right 10/31/2017   Procedure: LIGATION OF BRACHIO-BASILIC VEIN TRANSPOSITION RIGHT ARM;  Surgeon: Conrad Newcastle, MD;  Location: [place];  Service: Vascular;  Laterality: Right;   LOWER EXTREMITY ANGIOGRAPHY  05/02/2019   Procedure: Lower Extremity Angiography;  Surgeon: Marty Heck, MD;  Location: [facility];  Service: Cardiovascular;;   MULTIPLE EXTRACTIONS WITH ALVEOLOPLASTY N/A 06/08/2017   Procedure: MULTIPLE EXTRACTION WITH ALVEOLOPLASTY AND PRE PROSTHETIC SURGERY AS NEEDED;  Surgeon: Lenn Cal, DDS;  Location: [place];  Service: Oral Surgery;  Laterality: N/A;   PERIPHERAL VASCULAR BALLOON ANGIOPLASTY  03/18/2019   Procedure: PERIPHERAL VASCULAR BALLOON ANGIOPLASTY;  Surgeon: Waynetta Sandy, MD;  Location: [facility];   Service: Cardiovascular;;  RT PT   PERIPHERAL VASCULAR INTERVENTION  03/18/2019   Procedure: PERIPHERAL VASCULAR INTERVENTION;  Surgeon: Waynetta Sandy, MD;  Location: [facility];  Service: Cardiovascular;;  RT SFA   PERIPHERAL VASCULAR INTERVENTION  05/02/2019   Procedure: PERIPHERAL VASCULAR INTERVENTION;  Surgeon: Marty Heck, MD;  Location: [facility];  Service: Cardiovascular;;  Lt. SFA   RIGHT HEART CATH N/A 06/07/2017   Procedure: RIGHT HEART CATH;  Surgeon: Larey Dresser, MD;  Location: [facility];  Service: Cardiovascular;  Laterality: N/A;   RIGHT HEART CATH N/A 02/08/2018   Procedure: RIGHT HEART CATH;  Surgeon: Larey Dresser, MD;  Location: [place];  Service: Cardiovascular;  Laterality: N/A;   RIGHT HEART CATH N/A 08/09/2018   Procedure: RIGHT HEART CATH;  Surgeon: Larey Dresser, MD;  Location: [facility];  Service: Cardiovascular;  Laterality: N/A;   RIGHT/LEFT HEART CATH AND CORONARY ANGIOGRAPHY N/A 11/23/2016   Procedure: Right/Left Heart Cath and Coronary Angiography;  Surgeon: Larey Dresser, MD;  Location: [place];  Service: Cardiovascular;  Laterality: N/A;   TEE WITHOUT CARDIOVERSION N/A 06/12/2017   Procedure: TRANSESOPHAGEAL ECHOCARDIOGRAM (TEE);  Surgeon: Prescott Gum, Collier Salina, MD;  Location: [place];  Service: Open Heart Surgery;  Laterality: N/A;   TEE WITHOUT CARDIOVERSION N/A 02/28/2018   Procedure: TRANSESOPHAGEAL ECHOCARDIOGRAM (TEE);  Surgeon: Prescott Gum, Collier Salina, MD;  Location: [place];  Service: Open Heart Surgery;  Laterality: N/A;   TEMPORARY DIALYSIS CATHETER Left 05/24/2018   Procedure: TEMPORARY DIALYSIS CATHETER;  Surgeon: Marty Heck, MD;  Location: [facility];  Service: Cardiovascular;  Laterality: Left;   THROMBECTOMY AND REVISION OF ARTERIOVENTOUS (AV) GORETEX  GRAFT Right 12/12/2017   Procedure: THROMBECTOMY ARTERIOVENTOUS (AV) GORETEX  GRAFT RIGHT UPPER ARM;  Surgeon: Conrad , MD;  Location: [place];  Service: Vascular;  Laterality: Right;   TRANSMETATARSAL AMPUTATION Right 04/30/2019   Procedure: RIGHT TRANSMETATARSAL AMPUTATION;  Surgeon: Waynetta Sandy, MD;  Location: [facility];  Service: Vascular;  Laterality: Right;    Current Outpatient Medications  Medication Sig Dispense Refill   amiodarone (PACERONE) 200 MG tablet Take 200 mg twice a day for 7 days (starting 10/27/22), then resume 200 mg daily. 90 tablet 3   aspirin EC 81 MG tablet Take 1 tablet (81 mg total) by mouth daily. 90 tablet 3   atorvastatin (LIPITOR) 40 MG tablet Take 1 tablet (40 mg total) by mouth daily at 6 PM. 90 tablet 3   calcitRIOL (ROCALTROL) 0.5 MCG capsule Take  1 capsule (0.5 mcg total) by mouth every Monday, Wednesday, and Friday. 15 capsule 5   cefadroxil (DURICEF) 500 MG capsule Take 2 capsules (1,000 mg total) by mouth 3 (three) times a week. Take AFTER dialysis sessions 24 capsule 11   citalopram (CELEXA) 20 MG tablet Take 1 tablet (20 mg total) by mouth daily. 30 tablet 6   COLACE 100 MG capsule Take 100 mg by mouth daily as needed for moderate constipation.     hydrALAZINE (APRESOLINE) 25 MG tablet Take 1 tablet three times a day on NON-dialysis days only 270 tablet 3   insulin glargine (LANTUS) 100 UNIT/ML injection Inject 0.35 mLs (35 Units total) into the skin every morning. 3 mL 11   LORazepam (ATIVAN) 0.5 MG tablet Take 1 tablet (0.5 mg total) by mouth every 12 (twelve) hours as needed (nausea). 60 tablet 0   metoCLOPramide (REGLAN) 10 MG tablet Take 1 tablet (10 mg total) by mouth 3 (three) times daily. (Patient taking differently: Take 10 mg by mouth 3 (three) times daily before meals.) 90 tablet 6   midodrine (PROAMATINE) 5 MG tablet Take 2 tablets (10 mg total) by mouth every Monday, Wednesday, and Friday. Take 1 tablet before dialysis treatment 70 tablet 6   multivitamin (RENA-VIT) TABS tablet Take 1 tablet by mouth daily.     ondansetron (ZOFRAN-ODT) 4 MG  disintegrating tablet Take by mouth as needed.     oxyCODONE-acetaminophen (PERCOCET) 7.5-325 MG tablet Take 1 tablet by mouth 3 (three) times daily as needed for pain.     pantoprazole (PROTONIX) 40 MG tablet Take 1 tablet (40 mg total) by mouth 2 (two) times daily before lunch and supper. 60 tablet 11   promethazine (PHENERGAN) 12.5 MG tablet TAKE 1 TABLET (12.5 MG TOTAL) BY MOUTH EVERY 6 (SIX) HOURS AS NEEDED FOR NAUSEA OR VOMITING. 30 tablet 3   sevelamer carbonate (RENVELA) 0.8 g PACK packet Take 0.8 g by mouth 3 (three) times daily with meals. 270 each 1   triamcinolone ointment (KENALOG) 0.1 % Apply 1 Application topically 2 (two) times daily. 30 g 0   warfarin (COUMADIN) 5 MG tablet TAKE 1 TABLET BY MOUTH EVERY DAY 30 tablet 11   nitroGLYCERIN (NITROSTAT) 0.4 MG SL tablet Place 1 tablet (0.4 mg total) under the tongue every 5 (five) minutes as needed for chest pain. 90 tablet 3   No current facility-administered medications for this visit.    Allergies:   Metformin and related   Social History:  The patient  reports that he has never smoked. He has never used smokeless tobacco. He reports that he does not drink alcohol and does not use drugs.   Family History:  The patient's family history includes Colitis in his sister and sister; Colon polyps in his mother; Diabetes in his father and mother; Heart attack in his father; Heart disease in his brother; Stroke in his paternal grandfather.  ROS:  Please see the history of present illness.    All other systems are reviewed and otherwise negative.   PHYSICAL EXAM:  VS:  BP 114/78   Pulse 84   SpO2 98%  BMI: There is no height or weight on file to calculate BMI. Well nourished, well developed, in no acute distress HEENT: normocephalic, atraumatic Neck: he had a HD catheter L IJ Cardiac:  VAD hum Lungs:  CTA b/l, no wheezing, rhonchi or rales Abd: soft, nontender MS: b/l AKA's Ext: b/l AKAs Skin: warm and dry, no rash Neuro:  No  gross deficits appreciated Psych: euthymic mood, full affect  ICD site is stable, no tethering or discomfort   EKG:  Done today and reviewed by myself shows  Electrical artifact/interference, is SR,1 st degree AVblock, 232m, no clear changes  Device interrogation done today and reviewed by myself:  Battery and lead measurements are good RV threshold up slightly, outputs adjusted for 2:1 VP 2.3% 2/21 he had a few NSVTs and 2 sustained (though fairly short, and slow)  170m19seconds w/HR 153 23 seconds  155bpm   03/30/2021: TTE 1. Left ventricular ejection fraction, by estimation, is 20 to 25%. The  left ventricle has severely decreased function. The left ventricle  demonstrates global hypokinesis. The left ventricular internal cavity size  was mildly dilated. Left ventricular  diastolic parameters are consistent with Grade II diastolic dysfunction  (pseudonormalization). The inflow cannula of the LVAD was not visualized.  The IV septum is mildly rightward.   2. Right ventricular systolic function is mildly reduced. The right  ventricular size is mildly enlarged.   3. The mitral valve is normal in structure. Trivial mitral valve  regurgitation. No evidence of mitral stenosis.   4. The aortic valve is tricuspid. Aortic valve regurgitation is mild.  Mild aortic valve sclerosis is present, with no evidence of aortic valve  stenosis. The aortic valve opens with each beat.   Recent Labs: No results found for requested labs within last 365 days.  No results found for requested labs within last 365 days.   CrCl cannot be calculated (Patient's most recent lab result is older than the maximum 21 days allowed.).   Wt Readings from Last 3 Encounters:  06/28/22 165 lb 5.5 oz (75 kg)  12/07/21 164 lb 3.9 oz (74.5 kg)  08/18/21 165 lb (74.8 kg)     Other studies reviewed: Additional studies/records reviewed today include: summarized above  ASSESSMENT AND PLAN:  VT Amiodarone  chronic since Feb 2023 He is on amiodarone '200mg'$  BID since 1/22, to resume daily after 7 days, he is aware. If we start to see more, longer VT episodes, might consider mexiletine or adding some ATP Not needed yet though I don't think  No anginal symptoms  ICD Intact function Programming change as above  CAD No anginal symptoms C/w Dr. McEulis ManlyICM Chronic CHF LVAD CoreVue looks baseline Has dialysis M-W-F  AFlutter On warfarin <1% burden   Disposition: F/u with remotes as usual, in clinic with EP/Dr. KlCaryl Comesn 3 mo, sooner if needed  Current medicines are reviewed at length with the patient today.  The patient did not have any concerns regarding medicines.  SiVenetia NightPA-C 11/01/2022 8:40 AM     [place] [phone])  [phone])

## 2022-11-01 ENCOUNTER — Ambulatory Visit (INDEPENDENT_AMBULATORY_CARE_PROVIDER_SITE_OTHER): Payer: Medicare Other | Admitting: Physician Assistant

## 2022-11-01 ENCOUNTER — Encounter: Payer: Self-pay | Admitting: Physician Assistant

## 2022-11-01 ENCOUNTER — Ambulatory Visit (HOSPITAL_COMMUNITY)
Admission: RE | Admit: 2022-11-01 | Discharge: 2022-11-01 | Disposition: A | Discharge status: Home / Self Care | Payer: Medicare Other | Source: Ambulatory Visit | Attending: Cardiology | Admitting: Cardiology

## 2022-11-01 ENCOUNTER — Ambulatory Visit (HOSPITAL_COMMUNITY)
Admission: RE | Admit: 2022-11-01 | Discharge: 2022-11-01 | Disposition: A | Discharge status: Home / Self Care | Payer: Medicare Other | Source: Ambulatory Visit | Attending: Cardiothoracic Surgery | Admitting: Cardiothoracic Surgery

## 2022-11-01 ENCOUNTER — Ambulatory Visit (HOSPITAL_COMMUNITY): Payer: Self-pay | Admitting: Pharmacist

## 2022-11-01 VITALS — BP 114/78 | HR 84

## 2022-11-01 DIAGNOSIS — I472 Ventricular tachycardia, unspecified: Secondary | ICD-10-CM | POA: Insufficient documentation

## 2022-11-01 DIAGNOSIS — X58XXXA Exposure to other specified factors, initial encounter: Secondary | ICD-10-CM | POA: Insufficient documentation

## 2022-11-01 DIAGNOSIS — I252 Old myocardial infarction: Secondary | ICD-10-CM | POA: Insufficient documentation

## 2022-11-01 DIAGNOSIS — E1151 Type 2 diabetes mellitus with diabetic peripheral angiopathy without gangrene: Secondary | ICD-10-CM | POA: Diagnosis not present

## 2022-11-01 DIAGNOSIS — Z79899 Other long term (current) drug therapy: Secondary | ICD-10-CM | POA: Insufficient documentation

## 2022-11-01 DIAGNOSIS — Z992 Dependence on renal dialysis: Secondary | ICD-10-CM | POA: Diagnosis not present

## 2022-11-01 DIAGNOSIS — T827XXA Infection and inflammatory reaction due to other cardiac and vascular devices, implants and grafts, initial encounter: Secondary | ICD-10-CM | POA: Diagnosis not present

## 2022-11-01 DIAGNOSIS — Z95811 Presence of heart assist device: Secondary | ICD-10-CM | POA: Diagnosis not present

## 2022-11-01 DIAGNOSIS — Z95810 Presence of automatic (implantable) cardiac defibrillator: Secondary | ICD-10-CM | POA: Diagnosis not present

## 2022-11-01 DIAGNOSIS — I5022 Chronic systolic (congestive) heart failure: Secondary | ICD-10-CM | POA: Insufficient documentation

## 2022-11-01 DIAGNOSIS — Z7901 Long term (current) use of anticoagulants: Secondary | ICD-10-CM | POA: Insufficient documentation

## 2022-11-01 DIAGNOSIS — I2510 Atherosclerotic heart disease of native coronary artery without angina pectoris: Secondary | ICD-10-CM | POA: Insufficient documentation

## 2022-11-01 DIAGNOSIS — I132 Hypertensive heart and chronic kidney disease with heart failure and with stage 5 chronic kidney disease, or end stage renal disease: Secondary | ICD-10-CM | POA: Diagnosis present

## 2022-11-01 DIAGNOSIS — E1143 Type 2 diabetes mellitus with diabetic autonomic (poly)neuropathy: Secondary | ICD-10-CM | POA: Diagnosis not present

## 2022-11-01 DIAGNOSIS — R059 Cough, unspecified: Secondary | ICD-10-CM | POA: Insufficient documentation

## 2022-11-01 DIAGNOSIS — I82503 Chronic embolism and thrombosis of unspecified deep veins of lower extremity, bilateral: Secondary | ICD-10-CM | POA: Insufficient documentation

## 2022-11-01 DIAGNOSIS — Z9049 Acquired absence of other specified parts of digestive tract: Secondary | ICD-10-CM | POA: Diagnosis not present

## 2022-11-01 DIAGNOSIS — I255 Ischemic cardiomyopathy: Secondary | ICD-10-CM | POA: Diagnosis not present

## 2022-11-01 DIAGNOSIS — E1122 Type 2 diabetes mellitus with diabetic chronic kidney disease: Secondary | ICD-10-CM | POA: Diagnosis not present

## 2022-11-01 DIAGNOSIS — I4892 Unspecified atrial flutter: Secondary | ICD-10-CM | POA: Diagnosis not present

## 2022-11-01 LAB — POCT INR: INR: 2.6 (ref 2.0–3.0)

## 2022-11-01 LAB — CUP PACEART INCLINIC DEVICE CHECK
Battery Remaining Longevity: 42 mo
Brady Statistic RA Percent Paced: 0.05 %
Brady Statistic RV Percent Paced: 2.3 %
Date Time Interrogation Session: 20240227132724
HighPow Impedance: 58.1786
Implantable Lead Connection Status: 753985
Implantable Lead Connection Status: 753985
Implantable Lead Implant Date: 20100430
Implantable Lead Implant Date: 20100430
Implantable Lead Location: 753859
Implantable Lead Location: 753860
Implantable Lead Model: 7121
Implantable Pulse Generator Implant Date: 20180110
Lead Channel Impedance Value: 362.5 Ohm
Lead Channel Impedance Value: 725 Ohm
Lead Channel Pacing Threshold Amplitude: 1 V
Lead Channel Pacing Threshold Amplitude: 1 V
Lead Channel Pacing Threshold Amplitude: 1.5 V
Lead Channel Pacing Threshold Amplitude: 1.5 V
Lead Channel Pacing Threshold Pulse Width: 0.5 ms
Lead Channel Pacing Threshold Pulse Width: 0.5 ms
Lead Channel Pacing Threshold Pulse Width: 1 ms
Lead Channel Pacing Threshold Pulse Width: 1 ms
Lead Channel Sensing Intrinsic Amplitude: 0.8 mV
Lead Channel Sensing Intrinsic Amplitude: 8.6 mV
Lead Channel Setting Pacing Amplitude: 2.5 V
Lead Channel Setting Pacing Amplitude: 3 V
Lead Channel Setting Pacing Pulse Width: 1 ms
Lead Channel Setting Sensing Sensitivity: 0.5 mV
Pulse Gen Serial Number: 7383809
Zone Setting Status: 755011

## 2022-11-01 NOTE — Progress Notes (Signed)
Patient presents to clinic via wheelchair with his son. Denies issues with VAD equipment. Pt's son transferred pt to stretcher.   Pt had CT chest/abd/pelvis w/o this morning.  Pt currently having IV Cefazolin 2 gm after HD sessions x 3 weeks. Pt does not need to take PO Cefadroxil while receiving IV antibiotics.   Exit Site Care: Drive line is being maintained daily by daughter. He is not wearing an anchor- refuses to wear. Pt asked today if having an anchor in place would prevent further infection/trauma. Encouraged him to always wear an anchor to prevent trauma. He is open to trying to wear an anchor again. He allowed VAD coordinator to apply anchor today.   Existing VAD dressing removed and site care performed using sterile technique. Drive line exit site cleaned with Vashe and allowed to dry. Then site cleansed with Chlora prep applicators x 2, allowed to dry, then rinsed with saline, and allowed to dry. NO silver strip applied. VASHE solution moistened 2 x 2 placed at exit site. Site tunnels 3 cm.  Scant amount of  brown drainage noted on gauze. No redness, rash, pain or foul odor noted. Pt denies fever or chills. Advanced to every other day dressing changes. Pt provided with 2 x 2 gauze and 8 anchors.        Plan: Change drive line dressing every other day. Place small amount of VASHE solution moistened 2 x 2 gauze at exit site We will arrange for IV antibiotics for 2 more weeks following HD. When taking IV antibiotics you do not have to take by mouth antibiotics.   Return to [place] clinic in 1 week for follow up visit with Dr Harvie Heck RN [place] Coordinator  Office: [phone]  24/7 Pager: [phone]

## 2022-11-01 NOTE — Patient Instructions (Signed)
Medication Instructions:   Your physician recommends that you continue on your current medications as directed. Please refer to the Current Medication list given to you today.   *If you need a refill on your cardiac medications before your next appointment, please call your pharmacy*   Lab Work: REQUEST LABS CMET  MAG AND TSH FAX TO [phone] [id number] [place]   If you have labs (blood work) drawn today and your tests are completely normal, you will receive your results only by: [place] (if you have MyChart) OR A paper copy in the mail If you have any lab test that is abnormal or we need to change your treatment, we will call you to review the results.   Testing/Procedures:  NONE ORDERED  TODAY    Follow-Up: At [facility], you and your health needs are our priority.  As part of our continuing mission to provide you with exceptional heart care, we have created designated Provider Care Teams.  These Care Teams include your primary Cardiologist (physician) and Advanced Practice Providers (APPs -  Physician Assistants and Nurse Practitioners) who all work together to provide you with the care you need, when you need it.  We recommend signing up for the patient portal called "MyChart".  Sign up information is provided on this After Visit Summary.  MyChart is used to connect with patients for Virtual Visits (Telemedicine).  Patients are able to view lab/test results, encounter notes, upcoming appointments, etc.  Non-urgent messages can be sent to your provider as well.   To learn more about what you can do with MyChart, go to [email or web address].    Your next appointment:   3 month(s)  Provider:   Virl Axe, MD

## 2022-11-02 ENCOUNTER — Telehealth (HOSPITAL_COMMUNITY): Payer: Self-pay | Admitting: Unknown Physician Specialty

## 2022-11-02 NOTE — Telephone Encounter (Signed)
Reviewed pts CT scan with Dr Prescott Gum. Called pt to let him know that infection is most likely superficial. We will continue to the course of the IV antibiotics at dialysis and will see the pt in clinic next week for a full visit with Dr Aundra Dubin.  [name] RN, BSN VAD Coordinator 24/7 Pager [phone]

## 2022-11-08 ENCOUNTER — Encounter (HOSPITAL_COMMUNITY): Payer: Medicare Other

## 2022-11-08 ENCOUNTER — Ambulatory Visit (HOSPITAL_COMMUNITY)
Admission: RE | Admit: 2022-11-08 | Discharge: 2022-11-08 | Disposition: A | Discharge status: Home / Self Care | Payer: Medicare Other | Source: Ambulatory Visit | Attending: Cardiology | Admitting: Cardiology

## 2022-11-08 ENCOUNTER — Ambulatory Visit (HOSPITAL_COMMUNITY): Payer: Self-pay | Admitting: Pharmacist

## 2022-11-08 VITALS — BP 142/0 | HR 86

## 2022-11-08 DIAGNOSIS — Z89612 Acquired absence of left leg above knee: Secondary | ICD-10-CM | POA: Insufficient documentation

## 2022-11-08 DIAGNOSIS — Y848 Other medical procedures as the cause of abnormal reaction of the patient, or of later complication, without mention of misadventure at the time of the procedure: Secondary | ICD-10-CM | POA: Insufficient documentation

## 2022-11-08 DIAGNOSIS — E1122 Type 2 diabetes mellitus with diabetic chronic kidney disease: Secondary | ICD-10-CM | POA: Diagnosis not present

## 2022-11-08 DIAGNOSIS — K219 Gastro-esophageal reflux disease without esophagitis: Secondary | ICD-10-CM | POA: Insufficient documentation

## 2022-11-08 DIAGNOSIS — D6851 Activated protein C resistance: Secondary | ICD-10-CM | POA: Insufficient documentation

## 2022-11-08 DIAGNOSIS — Z9049 Acquired absence of other specified parts of digestive tract: Secondary | ICD-10-CM | POA: Diagnosis not present

## 2022-11-08 DIAGNOSIS — Z7901 Long term (current) use of anticoagulants: Secondary | ICD-10-CM | POA: Insufficient documentation

## 2022-11-08 DIAGNOSIS — I252 Old myocardial infarction: Secondary | ICD-10-CM | POA: Diagnosis not present

## 2022-11-08 DIAGNOSIS — I5022 Chronic systolic (congestive) heart failure: Secondary | ICD-10-CM | POA: Diagnosis present

## 2022-11-08 DIAGNOSIS — I4892 Unspecified atrial flutter: Secondary | ICD-10-CM | POA: Diagnosis not present

## 2022-11-08 DIAGNOSIS — Z7982 Long term (current) use of aspirin: Secondary | ICD-10-CM | POA: Insufficient documentation

## 2022-11-08 DIAGNOSIS — Z89611 Acquired absence of right leg above knee: Secondary | ICD-10-CM | POA: Diagnosis not present

## 2022-11-08 DIAGNOSIS — I132 Hypertensive heart and chronic kidney disease with heart failure and with stage 5 chronic kidney disease, or end stage renal disease: Secondary | ICD-10-CM | POA: Diagnosis not present

## 2022-11-08 DIAGNOSIS — E1143 Type 2 diabetes mellitus with diabetic autonomic (poly)neuropathy: Secondary | ICD-10-CM | POA: Insufficient documentation

## 2022-11-08 DIAGNOSIS — Z86718 Personal history of other venous thrombosis and embolism: Secondary | ICD-10-CM | POA: Insufficient documentation

## 2022-11-08 DIAGNOSIS — N186 End stage renal disease: Secondary | ICD-10-CM | POA: Insufficient documentation

## 2022-11-08 DIAGNOSIS — I2510 Atherosclerotic heart disease of native coronary artery without angina pectoris: Secondary | ICD-10-CM | POA: Diagnosis not present

## 2022-11-08 DIAGNOSIS — E785 Hyperlipidemia, unspecified: Secondary | ICD-10-CM | POA: Insufficient documentation

## 2022-11-08 DIAGNOSIS — Z79899 Other long term (current) drug therapy: Secondary | ICD-10-CM

## 2022-11-08 DIAGNOSIS — I255 Ischemic cardiomyopathy: Secondary | ICD-10-CM | POA: Diagnosis not present

## 2022-11-08 DIAGNOSIS — Z955 Presence of coronary angioplasty implant and graft: Secondary | ICD-10-CM | POA: Insufficient documentation

## 2022-11-08 DIAGNOSIS — Y718 Miscellaneous cardiovascular devices associated with adverse incidents, not elsewhere classified: Secondary | ICD-10-CM | POA: Diagnosis not present

## 2022-11-08 DIAGNOSIS — I5023 Acute on chronic systolic (congestive) heart failure: Secondary | ICD-10-CM

## 2022-11-08 DIAGNOSIS — Z792 Long term (current) use of antibiotics: Secondary | ICD-10-CM | POA: Insufficient documentation

## 2022-11-08 DIAGNOSIS — Z95811 Presence of heart assist device: Secondary | ICD-10-CM

## 2022-11-08 DIAGNOSIS — I509 Heart failure, unspecified: Secondary | ICD-10-CM

## 2022-11-08 DIAGNOSIS — K5090 Crohn's disease, unspecified, without complications: Secondary | ICD-10-CM | POA: Diagnosis not present

## 2022-11-08 DIAGNOSIS — Z992 Dependence on renal dialysis: Secondary | ICD-10-CM | POA: Diagnosis not present

## 2022-11-08 DIAGNOSIS — K3184 Gastroparesis: Secondary | ICD-10-CM | POA: Diagnosis not present

## 2022-11-08 DIAGNOSIS — I472 Ventricular tachycardia, unspecified: Secondary | ICD-10-CM | POA: Diagnosis not present

## 2022-11-08 DIAGNOSIS — T827XXA Infection and inflammatory reaction due to other cardiac and vascular devices, implants and grafts, initial encounter: Secondary | ICD-10-CM | POA: Insufficient documentation

## 2022-11-08 LAB — POCT INR: INR: 2.4 (ref 2.0–3.0)

## 2022-11-08 NOTE — Progress Notes (Addendum)
Patient presents for 2 month f/u with his son. Denies issues with VAD equipment.   Denies lightheadedness, dizziness, falls, chest pain, and signs of bleeding. [name] transferred him from wheelchair to stretcher today. Reports good PO intake. PRN Oxy provided by pain clinic for back and groin pain- pain well managed.   Pt currently having IV Cefazolin 2 gm after HD sessions until March 12th. Pt does not need to take PO Cefadroxil while receiving IV antibiotics. Pt instructed today that he needs to restart the Cefadroxil when the IV antibiotics end. Pt has repeated trauma to driveline as he refuses to wear anchor. Pt informed today that his driveline will not heal as long as he refuses to anchor it in place.   Pt states that he has had difficulty breathing recently. He notices this when he is lying in bed. We will obtain a chest xray today and order PFTs.  BP elevated today. He has not taken morning medications.   Pt confirms that he is taking Amio 200 daily. Will obtain CMET and TSH with next HD labs per Dr Aundra Dubin.  Labs from dialysis 10/19/22 reviewed by Dr Aundra Dubin.     Vital Signs:  Doppler Pressure: 142 Automatc BP: 141/106 (127) HR: 86 SR SPO2: 99% on RA   Weight: UTO lb w/o eqt Last weight: 167 lb pt reported from dialysis center (dry wt: 76 kg)   VAD Indication: Destination therapy- Pt is no longer candidate for heart/kidney transplant due to PAD per [facility].    VAD interrogation & Equipment Management: Speed: 5600 Flow: 3.8 Power: 4.7 w    PI: 4.6   Alarms: none Events: rare, except 100 + on HD days  Fixed speed: 5600 Low speed limit: 5300   Primary Controller: Replace back up battery in 9 months Back up controller:   Replace back up battery in 11 months   Annual Equipment Maintenance on UBC/PM was performed on 03/30/21.    I reviewed the LVAD parameters from today and compared the results to the patient's prior recorded data. LVAD interrogation was NEGATIVE for  significant power changes, NEGATIVE for clinical alarms and STABLE for PI events/speed drops. No programming changes were made and pump is functioning within specified parameters. Pt is performing daily controller and system monitor self tests along with completing weekly and monthly maintenance for LVAD equipment.   LVAD equipment check completed and is in good working order. Back-up equipment present.   Exit Site Care: Drive line is being maintained daily by daughter. He is not wearing an anchor- refuses to wear. Existing VAD dressing removed and site care performed using sterile technique. Drive line exit site cleaned with Vashe and allowed to dry. Then site cleansed with Chlora prep applicators x 2, allowed to dry, then rinsed with saline, and allowed to dry. NO silver strip applied. VASHE solution moistened 2 x 2 placed at exit site.   Scant amount of brown/bloody drainage noted on gauze. No redness, rash, pain or foul odor noted. Pt denies fever or chills. Advanced to every other day dressing changes.  Pt provided with 14 daily kits for home use.      Significant Events on VAD Support:  -10/2016>> poss drive line infection, CT ABD neg, ID consult-doxy -11/09/16>> admit for poss drive line infection, IV abx -03/24/17>> doxy for poss drive line infection -05/04/17>> drive line debridement with wound-vac -06/2017>> drive line +proteus, IV abx, Bactrim x14 days  Device: St jude Dual Therapies: on 200 bpm Last check: today  BP & Labs: Doppler BP [id number] -modified systolic. Has not taken medications yet today.   Hgb 11.1 on HD labs 10/19/22- No S/S of bleeding. Specifically denies melena/BRBPR or nosebleeds.   LDH 222 on HD labs 10/19/22 - Denies tea-colored urine. No power elevations noted on interrogation.    Patient Instructions:  Please check in at radiology for chest xray today We will schedule you for Pulmonary Function Tests When your IV antibiotics end please restart Cefadroxil 1000 mg  after HD. Coumadin dosing per Ander Purpura PharmD Return to clinic in 2 months for follow up with Dr Aundra Dubin with a RAMP echo  [name] RN [place] Coordinator  Office: [phone]  24/7 Pager: [phone]   Cardiology: Dr. Aundra Dubin  HPI: 57 y.o. with history of CAD s/p anterior MI in 2010 and NSTEMI in 3/18 with DES to pRCA and mLAD and ischemic cardiomyopathy now s/p Heartmate 3 LVAD and ESRD presents for followup of CHF/LVAD.  Patient developed low output heart failure.  He was not deemed to be a good transplant candidate.  He was admitted in 10/18 and Heartmate 3 LVAD was placed.  He had baseline CKD stage 3 likely from diabetic nephropathy and CHF.  He developed peri-op AKI and ended up on dialysis.  No renal recovery to date.  St Jude ICD has been turned back on and ramp echo was done prior to discharge in 10/18, speed increased to 5600 rpm.   He was admitted in 11/18 with nausea/vomiting and abdominal pain.  This appears to have been due to diabetic gastroparesis (extensive GI evaluation). He had atrial flutter this admission that was converted to NSR with amiodarone.  He improved considerably with scheduled Reglan and erythromycin. He was re-admitted later in 11/18 with the same symptoms, suspect diabetic gastroparesis and was again treated with erythromycin.  He was not able to start domperidone due to prolonged QT interval. He had nausea again about a week ago but was able to manage at home with Phenergan suppositories.   Admitted 1/8 -> 09/16/17 for uncontrolled vomiting in setting of diabetic gastroparesis. Improved with phenergan suppositories and given erythromycin TID for 7 days post-discharge.    He was admitted in 2/19 for AV graft placement.  While in the hospital, we turned down his speed.  Since then, he has had considerably fewer PI events on HD days.    He was admitted in 4/19 with nausea/vomiting again, probably gastroparesis flare.   He was admitted in 5/19 with nausea/vomiting,  likely gastroparesis flare, treated with erythromycin course which he is finishing.   He has been evaluated at [facility] by Dr. Derrill Kay (gastroparesis specialist).   Gastric emptying study, surprisingly, was near normal. However, electrogastrogram showed poor gastric accomodation/capacity, "bradygastria."  Frequent small meals were recommended. Also, early am nausea was thought to be possible GERD.    Admitted 6/26 - 03/19/18 for emergent VAD exchange 03/01/18 in the setting of HM3 outflow graft kink. He required CVVHD post op, but was transtioned back to [facility] without problems. Course complicated by leukocytosis and fever. CT abd/chest negative for infection and blood cultures were negative. Post op pain well controlled with rare tramadol. Able to tolerate diet. Evaluated by PT/OT and HH PT recommended for discharge. Ramp echo completed prior to discharge with speed optimization. Resumed 81 mg ASA + coumadin with INR goal 2-3.   He was admitted 03/23/18 with nausea/gastroparesis symptoms and marked HTN at outpatient HD.  Erythromycin was restarted and gastroparesis symptoms were controlled with fall in  BP and discharge on 03/25/18.   He was admitted in 9/19 for LUE AV fistula placement.  He was readmitted in 10/19 with his typical gastroparesis symptoms, unable to take po. The fistula later failed and was replaced with a graft.  However, the graft was nonfunctional and developed a hematoma at the surgical site.    He was admitted in 12/19 with nausea/vomiting from HD. Also with LDH increased to 427 in setting of extensive hematoma around his left arm AV graft.  He has had tingling in his left hand and left grip is weak.    In 7/20, he was admitted with ischemic 4th toe on right foot.  Peripheral angiogram 03/18/19 showed 80% right SFA stenosis and occluded right PT.  He had stenting of SFA and PTCA of PT with restoration of flow.  However, he still needed amputation right 4th toe on 7/16 with intractable pain.   He was readmitted with pain in his right foot, nausea, and vomiting. There was concern for ongoing right foot ischemia with all toes and a portion of the foot now dusky.  He was treated with IV vancomycin for possible osteomyelitis/infection, but thought that more likely ischemia could explain all the findings.  He was discharged home to followup with vascular surgery.   He was re-admitted in 8/20 with ongoing right foot pain and ended up having transmetatarsal amputation of the right foot.  Left leg angiogram was done with DES to left SFA, only 1 vessel runoff via severely diseased peroneal artery, unable to intervene.   He was readmitted in 9/20 with ongoing right foot/lower leg pain and had right AKA.  He was discharged to [facility].    He was then admitted with gangrene in the remnant of his left foot in 11/20.  He had left AKA.    Patient was in [place] in 11/22 and fractured a metacarpal in his right hand.    Patient had episode of VT in 2/23 with ICD shock.  He was started on amiodarone.  He had another ICD discharge with VF in 10/23 in the setting of significant emotional stress (grandson had died).   Patient returns for followup of CHF/LVAD.  No further VT/VF, no dizziness or palpitations.  MAP is elevated but he has not taken hydralazine today.  He has noted increased dyspnea and orthopnea, keeping head of bed more elevated.  He says that he is reaching his dry weight with hemodialysis.  He has a chronic cough, no change.  No fever/chills. No upper respiratory congestion. 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   Reports taking Coumadin as prescribed and adherence to anticoagulation based dietary restrictions. Denies BRBPR or melena. No dark urine or hematuria.   Labs (11/18): hgb 10.4 => 8.7, LDH 213 Labs (08/04/17): hgb 9 Labs (1/19): HgbA1c 6.1 Labs (2/19): hgb 13.2 Labs (3/19): hgb 11.1 Labs (7/19): hgb 9.6 Labs (10/19: hgb 11.8 Labs (12/19): LDH 427 => 237,  hgb 7.7 Labs (11/20): hgb 8.1 Labs (1/21): HIV negative, RPR negative Labs (3/21): hgb 8.8 Labs (6/21): hgb 12.4 Labs (8/21): hgb 10.8, LDH 201 Labs (10/21): hgb 12.4, LDH 220 Labs (12/21): hgb 12, LDH 228 Labs (3/22): hgb 11.7 Labs (5/22): hgb 10.7, LDH 180 Labs (11/22): hgb 10.4 Labs (1/23): hgb 10.1 Labs (4/23): T4 normal, TSH mildly elevated 5.4 Labs (5/23): LDL 187, hgb 10.8 Labs (10/23): LFTs normal, TSH normal, hgb 12.2 Labs (12/23): LDH 220, hgb 11.2  PMH: 1. Chronic systolic CHF: Ischemic cardiomyopathy.  St Jude ICD.  - Echo (11/17) with EF 25-30%, moderate diastolic dysfunction, mild MR, mildly dilated RV with moderately decreased systolic function, peak RV-RA gradient 48 mmHg.  - Echo (3/18) with EF 25-30%, wall motion abnormalities, normal RV size and systolic function.  - Admission 3/18 with cardiogenic shock and AKI, required milrinone gtt.  - Echo (8/18) with EF 20-25%, mild LVH, moderate LV dilation, mild MR.  - CPX (8/18) with peak VO2 12.4, VE/VCO2 slope 35, RER 1.05 => severe HR limitation.  - Heartmate 3 LVAD 10/18.  - Pump thrombosis in setting of HM3 outflow graft kink.  Pump exchange 6/19 for HM3.  2. CAD: Anterior MI 2005, had bifurcation stenting LAD/diagonal.  Repeat cath 2010 with patent stents.  - NSTEMI 3/18: LHC with 99% ulcerated lesion proximal RCA, 95% mid LAD => PCI with DES to proximal RCA and mid LAD.  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  - Lower extremity venous dopplers (8/18): chronic DVT - V/Q scan (8/18): Normal, no PE.  4. Crohns disease: History of colectomy.  5. GERD 6. Diabetic gastroparesis: Admission 11/18 with abdominal pain, nausea/vomiting.  - [facility] workup 2019: Gastric emptying study near normal.  Negative breath test (no evidence for small bowel bacterial overgrowth).  Electrogastrogram showed poor gastric accomodation/capacity, "bradygastria."  7. HTN 8. Hyperlipidemia 9. Type II diabetes  10. ESRD: Post-op LVAD surgery.  He has  had failed AV fistula and AV graft, now dialyzing via catheter . 11. Carotid dopplers (8/18): mild bilateral stenosis.  12. PFTs (8/18): Mild restriction (no IDL on CT chest).  13. Lung nodules: CT chest 8/18 with small bilateral nodules.  14. PAD: ABIs with moderate bilateral disease in 8/18.  - Peripheral angiogram 03/18/19 showed 80% right SFA stenosis and occluded right PT.  He had stenting of SFA and PTCA of PT with restoration of flow.  However, he still needed amputation right 4th toe on 03/21/19 with intractable pain.  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  - Peripheral angiogram left leg: DES to left SFA, only 1 vessel runoff via severely diseased peroneal artery, unable to intervene. 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  - Left AKA 11/20.  15. Atrial flutter: In 11/18 associated with nausea/vomiting from gastroparesis.  16. VT: VT with ICD shock in 11/22.  - VT with ICD shock in 2/23, amiodarone started.  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 90 tablet 3   aspirin EC 81 MG tablet Take 1 tablet (81 mg total) by mouth daily. 90 tablet 3   atorvastatin (LIPITOR) 40 MG tablet Take 1 tablet (40 mg total) by mouth daily at 6 PM. 90 tablet 3   calcitRIOL (ROCALTROL) 0.5 MCG capsule Take 1 capsule (0.5 mcg total) by mouth every Monday, Wednesday, and Friday. 15 capsule 5   citalopram (CELEXA) 20 MG tablet Take 1 tablet (20 mg total) by mouth daily. 30 tablet 6   COLACE 100 MG capsule Take 100 mg by mouth daily as needed for moderate constipation.     hydrALAZINE (APRESOLINE) 25 MG tablet Take 1 tablet three times a day on NON-dialysis days only 270 tablet 3   insulin glargine (LANTUS) 100 UNIT/ML injection Inject 0.35 mLs (35 Units total) into the skin every morning. 3 mL 11   LORazepam (ATIVAN) 0.5 MG tablet Take  1 tablet (  0.5 mg total) by mouth every 12 (twelve) hours as needed (nausea). 60 tablet 0   metoCLOPramide (REGLAN) 10 MG tablet Take 1 tablet (10 mg total) by mouth 3 (three) times daily. (Patient taking differently: Take 10 mg by mouth 3 (three) times daily before meals.) 90 tablet 6   midodrine (PROAMATINE) 5 MG tablet Take 2 tablets (10 mg total) by mouth every Monday, Wednesday, and Friday. Take 1 tablet before dialysis treatment 70 tablet 6   multivitamin (RENA-VIT) TABS tablet Take 1 tablet by mouth daily.     oxyCODONE-acetaminophen (PERCOCET) 7.5-325 MG tablet Take 1 tablet by mouth 3 (three) times daily as needed for pain.     pantoprazole (PROTONIX) 40 MG tablet Take 1 tablet (40 mg total) by mouth 2 (two) times daily before lunch and supper. 60 tablet 11   sevelamer carbonate (RENVELA) 0.8 g PACK packet Take 0.8 g by mouth 3 (three) times daily with meals. 270 each 1   triamcinolone ointment (KENALOG) 0.1 % Apply 1 Application topically 2 (two) times daily. 30 g 0   warfarin (COUMADIN) 5 MG tablet TAKE 1 TABLET BY MOUTH EVERY DAY 30 tablet 11   cefadroxil (DURICEF) 500 MG capsule Take 2 capsules (1,000 mg total) by mouth 3 (three) times a week. Take AFTER dialysis sessions (Patient not taking: Reported on 11/08/2022) 24 capsule 11   nitroGLYCERIN (NITROSTAT) 0.4 MG SL tablet Place 1 tablet (0.4 mg total) under the tongue every 5 (five) minutes as needed for chest pain. (Patient not taking: Reported on 11/08/2022) 90 tablet 3   ondansetron (ZOFRAN-ODT) 4 MG disintegrating tablet Take by mouth as needed. (Patient not taking: Reported on 11/08/2022)     promethazine (PHENERGAN) 12.5 MG tablet TAKE 1 TABLET (12.5 MG TOTAL) BY MOUTH EVERY 6 (SIX) HOURS AS NEEDED FOR NAUSEA OR VOMITING. (Patient not taking: Reported on 11/08/2022) 30 tablet 3   No current facility-administered medications for this encounter.    Metformin and related   Review of systems complete and found to be negative unless listed  in HPI.    BP (!) 142/0   Pulse 86   SpO2 99%     VAD interrogation & Equipment Management: See LVAD nurse's note above.   General: Well appearing this am. NAD.  HEENT: Normal. Neck: Supple, JVP 7-8 cm. Carotids OK.  Cardiac:  Mechanical heart sounds with LVAD hum present.  Lungs:  CTAB, normal effort.  Abdomen:  NT, ND, no HSM. No bruits or masses. +BS  LVAD exit site: Well-healed and incorporated. Dressing dry and intact. No erythema or drainage. Stabilization device present and accurately applied. Driveline dressing changed daily per sterile technique. Extremities:  Warm and dry. No cyanosis, clubbing, rash, or edema. S/p bilateral AKAs.  Neuro:  Alert & oriented x 3. Cranial nerves grossly intact. Moves all 4 extremities w/o difficulty. Affect pleasant    ASSESSMENT AND PLAN: 1. PAD/ischemic feet:  He is now s/p bilateral AKAs.  Able to wheel his chair and do transfers.   2. Chronic systolic CHF: Ischemic cardiomyopathy.  St Jude ICD.  NSTEMI in March 2018 with DES to LAD and RCA, complicated by cardiogenic shock, low output requiring milrinone.  Echo 8/18 with EF 20-25%, moderate MR. CPX (8/18) with severe functional impairment due to HF.  He is s/p HM3 LVAD placement. He had pump thrombosis 6/19 due to [facility] outflow graft kinking.  He had pump exchange for another HM3.  LVAD parameters stable but he does have PI events and  low flow events at times with HD. He takes midodrine pre-HD, but needs hydralazine on non-HD days.  MAP high today but has not had hydralazine today.  He reports increased dyspnea but does not appear volume overloaded on exam and is reaching dry weight with HD.  - I am not sure why he has developed increased dyspnea/orthopnea.  Will arrange for chest X-ray and will also check PFTs given amiodarone use.  - Continue midodrine pre-HD.   - Volume managed at dialysis, MWF.    - Continue ASA 81 and warfarin with INR goal 2-2.5.    - Continue hydralazine 25 mg tid on non-HD  days. - He is not candidate for transplant now given extensive PAD with amputations.   3. ESRD: HD on M-W-F with midodrine pre-HD.  - Need to allow UF as long as MAP > 70 (would not stop for MAP 70-80).   4.  CAD: NSTEMI in 3/18.  LHC with 99% ulcerated lesion proximal RCA with left to right collaterals, 95% mid LAD stenosis after mid LAD stent.  s/p PCI to RCA and LAD on 11/25/16.  - Continue statin and ASA.  5. h/o DVTs: Factor V Leiden heterozygote. - Anticoagulated. 6. Diabetic gastroparesis: He has seen gastroparesis specialist at [facility]. Surprisingly, gastric emptying study was normal. However, electrogastrogram showed poor gastric accomodation/capacity. Therefore, small frequent meals recommended.  He has been doing much better with this, minimal symptoms.  - Continue reglan 10 mg tid/ac, now off Marinol.  - Continue Protonix bid.  - Limit narcotic use as much as possible.  7. HTN: Takes hydralazine 25 tid on non-HD days. MAP high today but has not had hydralazine.  - Take hydralazine as soon as you get home.  8. Chronic pain: Now seen in a pain clinic.  9. Atrial flutter: Paroxysmal.  Not seen recently.  10. VT: He was started on amiodarone after 2/23 ICD shock. VF again with shock in 10/23 in setting of extreme stress. No further events.  - Continue amiodarone 200 mg daily.  I will check LFTs and TSH.  He will need regular eye exam.  As above, with increased dyspnea and amiodarone use, will get CXR and PFTs.  - he does not drive.  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  Site improved.   - He will continue cefazolin infusions until 3/12.   - After 3/12, he will continue cefadroxil long-term (three days/week after HD).  This will be followed by ID.   Followup in 2 months.   Nasean Higinbotham 11/08/2022

## 2022-11-08 NOTE — Patient Instructions (Addendum)
  Please check in at radiology for chest xray today We will schedule you for Pulmonary Function Tests When your IV antibiotics end please restart Cefadroxil 1000 mg after HD. Coumadin dosing per Ander Purpura PharmD Return to clinic in 2 months for follow up with Dr Aundra Dubin with a RAMP echo

## 2022-11-10 ENCOUNTER — Telehealth: Payer: Self-pay

## 2022-11-10 ENCOUNTER — Ambulatory Visit (HOSPITAL_COMMUNITY)
Admission: RE | Admit: 2022-11-10 | Discharge: 2022-11-10 | Disposition: A | Discharge status: Home / Self Care | Payer: Medicare Other | Source: Ambulatory Visit | Attending: Cardiology | Admitting: Cardiology

## 2022-11-10 ENCOUNTER — Other Ambulatory Visit: Payer: Self-pay

## 2022-11-10 DIAGNOSIS — Z7901 Long term (current) use of anticoagulants: Secondary | ICD-10-CM

## 2022-11-10 DIAGNOSIS — I5023 Acute on chronic systolic (congestive) heart failure: Secondary | ICD-10-CM | POA: Insufficient documentation

## 2022-11-10 DIAGNOSIS — Z79899 Other long term (current) drug therapy: Secondary | ICD-10-CM

## 2022-11-10 DIAGNOSIS — R942 Abnormal results of pulmonary function studies: Secondary | ICD-10-CM

## 2022-11-10 DIAGNOSIS — Z95811 Presence of heart assist device: Secondary | ICD-10-CM

## 2022-11-10 LAB — PULMONARY FUNCTION TEST
DL/VA % pred: 0 %
DL/VA: 0.02 ml/min/mmHg/L
DLCO unc % pred: 0 %
DLCO unc: 0.03 ml/min/mmHg
FEF 25-75 Post: 1.75 L/sec
FEF 25-75 Pre: 1.77 L/sec
FEF2575-%Change-Post: -1 %
FEF2575-%Pred-Post: 62 %
FEF2575-%Pred-Pre: 63 %
FEV1-%Change-Post: 1 %
FEV1-%Pred-Post: 40 %
FEV1-%Pred-Pre: 40 %
FEV1-Post: 1.35 L
FEV1-Pre: 1.32 L
FEV1FVC-%Change-Post: 5 %
FEV1FVC-%Pred-Pre: 108 %
FEV6-%Change-Post: -3 %
FEV6-%Pred-Post: 37 %
FEV6-%Pred-Pre: 38 %
FEV6-Post: 1.55 L
FEV6-Pre: 1.61 L
FEV6FVC-%Pred-Post: 104 %
FEV6FVC-%Pred-Pre: 104 %
FVC-%Change-Post: -3 %
FVC-%Pred-Post: 35 %
FVC-%Pred-Pre: 37 %
FVC-Post: 1.55 L
FVC-Pre: 1.61 L
Post FEV1/FVC ratio: 87 %
Post FEV6/FVC ratio: 100 %
Pre FEV1/FVC ratio: 82 %
Pre FEV6/FVC Ratio: 100 %
RV % pred: 76 %
RV: 1.56 L
TLC % pred: 48 %
TLC: 3.12 L

## 2022-11-10 MED ORDER — ALBUTEROL SULFATE (2.5 MG/3ML) 0.083% IN NEBU
2.5000 mg | INHALATION_SOLUTION | Freq: Once | RESPIRATORY_TRACT | Status: AC
  Administered 2022-11-10: 2.5 mg via RESPIRATORY_TRACT

## 2022-11-10 NOTE — Telephone Encounter (Signed)
Alert received from [employer]:  Alert remote reviewed. Normal device function.   There were 3 VT arrhythmias detected that fell into the monitor only zone, the longest was 36 seconds, there were 16 NSVT arrhythmias detected, sent to triage.  Alert previously received on 10/26/2022 for VT in monitor zone.  At that time Pt's amiodarone was increased to 200 mg BID x 7 days with orders to return to daily after that.  Pt seen by RU 11/01/2022-noted if more VT or longer episodes to consider mexiletine or adding ATP.  Timing of episodes would suggest Pt was probably asleep.    Will forward to Dr. Aundra Dubin and Dr. Caryl Comes for review/advisement.

## 2022-11-11 ENCOUNTER — Other Ambulatory Visit (HOSPITAL_COMMUNITY): Payer: Self-pay | Admitting: *Deleted

## 2022-11-11 ENCOUNTER — Ambulatory Visit: Payer: Medicare Other | Attending: Internal Medicine

## 2022-11-11 DIAGNOSIS — I255 Ischemic cardiomyopathy: Secondary | ICD-10-CM

## 2022-11-11 DIAGNOSIS — I472 Ventricular tachycardia, unspecified: Secondary | ICD-10-CM

## 2022-11-11 DIAGNOSIS — Z95811 Presence of heart assist device: Secondary | ICD-10-CM

## 2022-11-11 MED ORDER — MEXILETINE HCL 150 MG PO CAPS: 150.0000 mg | ORAL_CAPSULE | Freq: Two times a day (BID) | ORAL | 6 refills | Status: DC

## 2022-11-11 NOTE — Telephone Encounter (Signed)
Alert from [employer]   Device alert for sustained VT with successful ATP therapy Event occurred 3/7 @ 19:02, EGM shows long duration of VT in the monitor zone, 1hr 64mn, rate increases to 200 falling into the VT-2 zone.  ATP delivered x2 successfully converting arrhythmia There are multiple episodes of NSVT, and VT falling in to the monitor zone Presenting AS/VP with non-physiologic noise on the V lead

## 2022-11-11 NOTE — Telephone Encounter (Signed)
Patient coming in at 1:30 today for reprogramming ICD therapies

## 2022-11-15 ENCOUNTER — Ambulatory Visit (HOSPITAL_COMMUNITY): Payer: Self-pay | Admitting: Pharmacist

## 2022-11-15 LAB — POCT INR: INR: 2 (ref 2.0–3.0)

## 2022-11-16 LAB — CUP PACEART INCLINIC DEVICE CHECK
Date Time Interrogation Session: 20240313144609
Implantable Lead Connection Status: 753985
Implantable Lead Connection Status: 753985
Implantable Lead Implant Date: 20100430
Implantable Lead Implant Date: 20100430
Implantable Lead Location: 753859
Implantable Lead Location: 753860
Implantable Lead Model: 7121
Implantable Pulse Generator Implant Date: 20180110
Pulse Gen Serial Number: 7383809

## 2022-11-16 NOTE — Progress Notes (Signed)
ICD in clinic check due to VT falling below therapy zone See PDF for changes. Programming changes made by [employer] rep

## 2022-11-18 ENCOUNTER — Encounter (HOSPITAL_COMMUNITY): Payer: Self-pay | Admitting: Unknown Physician Specialty

## 2022-11-18 NOTE — Progress Notes (Signed)
  Per Dr Oleh Genin request at pts last clinic visit -  cmet and tsh added to labs at dialysis d/t ongoing amio use. CMET unremarkable. TSH - 4.713. t4 and t3 both normal @ T4-7.3 and T3 0.67. Reviewed with DR Aundra Dubin and no changes made. I will have [place] scan them into epic. Thanks   [name] RN, BSN VAD Coordinator 24/7 Pager [phone]

## 2022-11-21 ENCOUNTER — Telehealth: Payer: Self-pay

## 2022-11-21 NOTE — Telephone Encounter (Signed)
Following alert received from [employer] received for Device alert for sustained VT with successful ATP therapy. Event occurred 3/16 @ 13:44, EGM shows sustained VT, rate 164, ATP delivered x1 converting to regular AS/VP Therapy zones adjusted 3/13. 20 NSVT episodes without therapy.  Patient reports he was not aware of pacing therapy and denies symptoms. Reports of medication compliance, no missed or skipped doses. Patient aware of driving restrictions and shock plan.  Was seen in clinic for recent changes to ICD 11/16/2022, see below.

## 2022-11-22 ENCOUNTER — Ambulatory Visit (HOSPITAL_COMMUNITY): Payer: Self-pay | Admitting: Pharmacist

## 2022-11-22 LAB — POCT INR: INR: 2.3 (ref 2.0–3.0)

## 2022-11-22 NOTE — Progress Notes (Unsigned)
Synopsis: Referred for dyspnea, decline in lung function by Larey Dresser, MD  Subjective:   PATIENT ID: [name] GENDER: male DOB: 1966/01/05, MRN: [id number]  No chief complaint on file.  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 Reported dyspnea/orthopnea with last VAD visit. Significant decline in lung fucntion (FEV1, FVC but unable to complete DLCO due to cough) relative to 2018 PFTs. Though 2018 PFTs were obtained prior to both placement of his original LVAD and his emergent VAD exchange 2019.   Otherwise pertinent review of systems is negative.  Past Medical History:  Diagnosis Date   Angina    ASCVD (arteriosclerotic cardiovascular disease)    , Anterior infarction 2005, LAD diagonal bifurcation intervention 03/2004   Automatic implantable cardiac defibrillator -St. Jude's        Benign neoplasm of colon    CHF (congestive heart failure) (HCC)    Chronic systolic heart failure (HCC)    Coronary artery disease     Widely patent previously placed stents in the left anterior    Crohn's disease ([place])    Deep venous thrombosis (HCC)    Recurrent-on Coumadin   Dialysis patient ([place])    Dyspnea    Gastroparesis    GERD (gastroesophageal reflux disease)    High cholesterol    Hyperlipidemia    Hypersomnolent    Previous diagnosis of narcolepsy   Hypertension, essential    Ischemic cardiomyopathy    Ejection fraction 15-20% catheterization 2010   Type II or unspecified type diabetes mellitus without mention of complication, not stated as uncontrolled    Unspecified gastritis and gastroduodenitis without mention of hemorrhage      Family History  Problem Relation Age of Onset   Colon polyps Mother    Diabetes Mother    Heart attack Father    Diabetes Father    Stroke Paternal Grandfather    Colitis Sister     Heart disease Brother    Colitis Sister    Colon cancer Neg Hx      Past Surgical History:  Procedure Laterality Date   A/V SHUNTOGRAM N/A 05/24/2018   Procedure: A/V SHUNTOGRAM;  Surgeon: Marty Heck, MD;  Location: [place] [facility];  Service: Cardiovascular;  Laterality: N/A;   ABDOMINAL AORTOGRAM W/LOWER EXTREMITY N/A 03/18/2019   Procedure: ABDOMINAL AORTOGRAM W/LOWER EXTREMITY;  Surgeon: Waynetta Sandy, MD;  Location: [facility] [facility];  Service: Cardiovascular;  Laterality: N/A;   AMPUTATION Right 03/21/2019   Procedure: AMPUTATION DIGIT RIGHT FOURTH TOE;  Surgeon: Waynetta Sandy, MD;  Location: [place];  Service: Vascular;  Laterality: Right;   AMPUTATION Left 05/29/2019   Procedure: AMPUTATION LEFT GREAT TOE;  Surgeon: Serafina Mitchell, MD;  Location: [place];  Service: Vascular;  Laterality: Left;   AMPUTATION Right 06/02/2019   Procedure: ABOVE KNEE AMPUTATION;  Surgeon: Rosetta Posner, MD;  Location: [place];  Service: Vascular;  Laterality: Right;   AMPUTATION Left 07/23/2019   Procedure: AMPUTATION ABOVE KNEE LEFT;  Surgeon: Elam Dutch, MD;  Location: [place];  Service: Vascular;  Laterality: Left;   APPENDECTOMY     AV FISTULA PLACEMENT Right 06/26/2017   Procedure: ARTERIOVENOUS (AV) FISTULA CREATION VERSUS GRAFT RIGHT  ARM;  Surgeon: Conrad [place], MD;  Location: [place];  Service: Vascular;  Laterality: Right;   AV  FISTULA PLACEMENT Right 10/31/2017   Procedure: INSERTION OF ARTERIOVENOUS (AV) ARTEGRAFT,  RIGHT UPPER ARM;  Surgeon: Conrad [place], MD;  Location: [place];  Service: Vascular;  Laterality: Right;   AV FISTULA PLACEMENT Left 05/29/2018   Procedure: ARTERIOVENOUS (AV) FISTULA CREATION  LEFT UPPER EXTREMITY;  Surgeon: Waynetta Sandy, MD;  Location: [place];  Service: Vascular;  Laterality: Left;   AV FISTULA PLACEMENT Left 08/09/2018   Procedure: INSERTION OF ARTERIOVENOUS (AV) GORE-TEX GRAFT LEFT UPPER ARM;  Surgeon: Waynetta Sandy, MD;  Location: [place];  Service: Vascular;  Laterality: Left;   CARDIAC DEFIBRILLATOR PLACEMENT  2010   St. Jude ICD   CENTRAL LINE INSERTION Right 05/24/2018   Procedure: CENTRAL LINE INSERTION;  Surgeon: Marty Heck, MD;  Location: [place] [facility];  Service: Cardiovascular;  Laterality: Right;   COLECTOMY  ~ 2003   "for Crohn's" ascending colon.     CORONARY STENT INTERVENTION N/A 11/25/2016   Procedure: Coronary Stent Intervention;  Surgeon: Leonie Man, MD;  Location: [place] [facility];  Service: Cardiovascular;  Laterality: N/A;   EP IMPLANTABLE DEVICE N/A 09/14/2016   Procedure: ICD Generator Changeout;  Surgeon: Deboraha Sprang, MD;  Location: [place] [facility];  Service: Cardiovascular;  Laterality: N/A;   ESOPHAGOGASTRODUODENOSCOPY N/A 07/12/2017   Procedure: ESOPHAGOGASTRODUODENOSCOPY (EGD);  Surgeon: Jerene Bears, MD;  Location: [facility];  Service: Gastroenterology;  Laterality: N/A;  VAD pt so VAD team will need to accompany pt.    FETAL SURGERY FOR CONGENITAL HERNIA     ????? pt has no knowledge of this.[name]     FISTULOGRAM Left 08/09/2018   Procedure: FISTULOGRAM LEFT ARM;  Surgeon: Waynetta Sandy, MD;  Location: [place];  Service: Vascular;  Laterality: Left;   INGUINAL HERNIA REPAIR     INSERTION OF DIALYSIS CATHETER Right 06/26/2017   Procedure: INSERTION OF TUNNELED  DIALYSIS CATHETER RIGHT INTERNAL JUGULAR;  Surgeon: Conrad [place], MD;  Location: [place];  Service: Vascular;  Laterality: Right;   INSERTION OF IMPLANTABLE LEFT VENTRICULAR ASSIST DEVICE N/A 06/12/2017   Procedure: INSERTION OF IMPLANTABLE LEFT VENTRICULAR ASSIST [place] 3;  Surgeon: Ivin Poot, MD;  Location: [place];  Service: Open Heart Surgery;  Laterality: N/A;  HM3 LVAD  CIRC ARREST  NITRIC OXIDE   INSERTION OF IMPLANTABLE LEFT VENTRICULAR ASSIST DEVICE N/A 02/28/2018   Procedure: REDO INSERTION OF IMPLANTABLE LEFT VENTRICULAR ASSIST DEVICE - HEARTMATE 3;   Surgeon: Ivin Poot, MD;  Location: [place];  Service: Open Heart Surgery;  Laterality: N/A;   IR FLUORO GUIDE CV LINE RIGHT  07/22/2019   IR REMOVAL TUN CV CATH W/O FL  02/26/2018   IR THORACENTESIS ASP PLEURAL SPACE W/IMG GUIDE  06/28/2017   IR US GUIDE VASC ACCESS RIGHT  07/22/2019   LIGATION OF ARTERIOVENOUS  FISTULA Right 10/31/2017   Procedure: LIGATION OF BRACHIO-BASILIC VEIN TRANSPOSITION RIGHT ARM;  Surgeon: Conrad [place], MD;  Location: [place];  Service: Vascular;  Laterality: Right;   LOWER EXTREMITY ANGIOGRAPHY  05/02/2019   Procedure: Lower Extremity Angiography;  Surgeon: Marty Heck, MD;  Location: [place] [facility];  Service: Cardiovascular;;   MULTIPLE EXTRACTIONS WITH ALVEOLOPLASTY N/A 06/08/2017   Procedure: MULTIPLE EXTRACTION WITH ALVEOLOPLASTY AND PRE PROSTHETIC SURGERY AS NEEDED;  Surgeon: Lenn Cal, DDS;  Location: [place];  Service: Oral Surgery;  Laterality: N/A;   PERIPHERAL VASCULAR BALLOON ANGIOPLASTY  03/18/2019   Procedure: PERIPHERAL VASCULAR BALLOON ANGIOPLASTY;  Surgeon: Waynetta Sandy, MD;  Location: [place] [facility];  Service: Cardiovascular;;  RT PT   PERIPHERAL VASCULAR INTERVENTION  03/18/2019   Procedure: PERIPHERAL VASCULAR INTERVENTION;  Surgeon: Waynetta Sandy, MD;  Location: [place] [facility];  Service: Cardiovascular;;  RT SFA   PERIPHERAL VASCULAR INTERVENTION  05/02/2019   Procedure: PERIPHERAL VASCULAR INTERVENTION;  Surgeon: Marty Heck, MD;  Location: [place] [facility];  Service: Cardiovascular;;  Lt. SFA   RIGHT HEART CATH N/A 06/07/2017   Procedure: RIGHT HEART CATH;  Surgeon: Larey Dresser, MD;  Location: [place] [facility];  Service: Cardiovascular;  Laterality: N/A;   RIGHT HEART CATH N/A 02/08/2018   Procedure: RIGHT HEART CATH;  Surgeon: Larey Dresser, MD;  Location: [place] [facility];  Service: Cardiovascular;  Laterality: N/A;   RIGHT HEART CATH N/A 08/09/2018   Procedure: RIGHT HEART  CATH;  Surgeon: Larey Dresser, MD;  Location: [place] [facility];  Service: Cardiovascular;  Laterality: N/A;   RIGHT/LEFT HEART CATH AND CORONARY ANGIOGRAPHY N/A 11/23/2016   Procedure: Right/Left Heart Cath and Coronary Angiography;  Surgeon: Larey Dresser, MD;  Location: [place] [facility];  Service: Cardiovascular;  Laterality: N/A;   TEE WITHOUT CARDIOVERSION N/A 06/12/2017   Procedure: TRANSESOPHAGEAL ECHOCARDIOGRAM (TEE);  Surgeon: Prescott Gum, Collier [place]ina, MD;  Location: [place];  Service: Open Heart Surgery;  Laterality: N/A;   TEE WITHOUT CARDIOVERSION N/A 02/28/2018   Procedure: TRANSESOPHAGEAL ECHOCARDIOGRAM (TEE);  Surgeon: Prescott Gum, Collier Salina, MD;  Location: [place];  Service: Open Heart Surgery;  Laterality: N/A;   TEMPORARY DIALYSIS CATHETER Left 05/24/2018   Procedure: TEMPORARY DIALYSIS CATHETER;  Surgeon: Marty Heck, MD;  Location: [place] [facility];  Service: Cardiovascular;  Laterality: Left;   THROMBECTOMY AND REVISION OF ARTERIOVENTOUS (AV) GORETEX  GRAFT Right 12/12/2017   Procedure: THROMBECTOMY ARTERIOVENTOUS (AV) GORETEX  GRAFT RIGHT UPPER ARM;  Surgeon: Conrad [place], MD;  Location: [place];  Service: Vascular;  Laterality: Right;   TRANSMETATARSAL AMPUTATION Right 04/30/2019   Procedure: RIGHT TRANSMETATARSAL AMPUTATION;  Surgeon: Waynetta Sandy, MD;  Location: [place];  Service: Vascular;  Laterality: Right;    Social History   Socioeconomic History   Marital status: Married    Spouse name: Not on file   Number of children: Not on file   Years of education: Not on file   Highest education level: Not on file  Occupational History   Not on file  Tobacco Use   Smoking status: Never   Smokeless tobacco: Never  Vaping Use   Vaping Use: Never used  Substance and Sexual Activity   Alcohol use: No   Drug use: No   Sexual activity: Never  Other Topics Concern   Not on file  Social History Narrative   Not on file   Social Determinants of Health    Financial Resource Strain: Low Risk  (11/19/2019)   Overall Financial Resource Strain (CARDIA)    Difficulty of Paying Living Expenses: Not hard at all  Food Insecurity: No Food Insecurity (11/19/2019)   Hunger Vital Sign    Worried About Running Out of Food in the Last Year: Never true    [place] in the Last Year: Never true  Transportation Needs: No Transportation Needs (11/19/2019)   PRAPARE - Hydrologist (Medical): No    Lack of Transportation (Non-Medical): No  Physical Activity: Not on file  Stress: Not on file  Social Connections: Not on file  Intimate Partner Violence: Not on file     Allergies  Allergen Reactions   Metformin And Related Diarrhea     Outpatient Medications Prior to Visit  Medication Sig Dispense Refill   aspirin EC 81 MG tablet Take 1 tablet (81 mg total) by mouth daily. 90 tablet 3   atorvastatin (LIPITOR) 40 MG tablet Take 1 tablet (40 mg total) by mouth daily at 6 PM. 90 tablet 3   calcitRIOL (ROCALTROL) 0.5 MCG capsule Take 1 capsule (0.5 mcg total) by mouth every Monday, Wednesday, and Friday. 15 capsule 5   cefadroxil (DURICEF) 500 MG capsule Take 2 capsules (1,000 mg total) by mouth 3 (three) times a week. Take AFTER dialysis sessions (Patient not taking: Reported on 11/08/2022) 24 capsule 11   citalopram (CELEXA) 20 MG tablet Take 1 tablet (20 mg total) by mouth daily. 30 tablet 6   COLACE 100 MG capsule Take 100 mg by mouth daily as needed for moderate constipation.     hydrALAZINE (APRESOLINE) 25 MG tablet Take 1 tablet three times a day on NON-dialysis days only 270 tablet 3   insulin glargine (LANTUS) 100 UNIT/ML injection Inject 0.35 mLs (35 Units total) into the skin every morning. 3 mL 11   LORazepam (ATIVAN) 0.5 MG tablet Take 1 tablet (0.5 mg total) by mouth every 12 (twelve) hours as needed (nausea). 60 tablet 0   metoCLOPramide (REGLAN) 10 MG tablet Take 1 tablet (10 mg total) by mouth 3 (three) times  daily. (Patient taking differently: Take 10 mg by mouth 3 (three) times daily before meals.) 90 tablet 6   mexiletine (MEXITIL) 150 MG capsule Take 1 capsule (150 mg total) by mouth 2 (two) times daily. 60 capsule 6   midodrine (PROAMATINE) 5 MG tablet Take 2 tablets (10 mg total) by mouth every Monday, Wednesday, and Friday. Take 1 tablet before dialysis treatment 70 tablet 6   multivitamin (RENA-VIT) TABS tablet Take 1 tablet by mouth daily.     nitroGLYCERIN (NITROSTAT) 0.4 MG SL tablet Place 1 tablet (0.4 mg total) under the tongue every 5 (five) minutes as needed for chest pain. (Patient not taking: Reported on 11/08/2022) 90 tablet 3   ondansetron (ZOFRAN-ODT) 4 MG disintegrating tablet Take by mouth as needed. (Patient not taking: Reported on 11/08/2022)     oxyCODONE-acetaminophen (PERCOCET) 7.5-325 MG tablet Take 1 tablet by mouth 3 (three) times daily as needed for pain.     pantoprazole (PROTONIX) 40 MG tablet Take 1 tablet (40 mg total) by mouth 2 (two) times daily before lunch and supper. 60 tablet 11   promethazine (PHENERGAN) 12.5 MG tablet TAKE 1 TABLET (12.5 MG TOTAL) BY MOUTH EVERY 6 (SIX) HOURS AS NEEDED FOR NAUSEA OR VOMITING. (Patient not taking: Reported on 11/08/2022) 30 tablet 3   sevelamer carbonate (RENVELA) 0.8 g PACK packet Take 0.8 g by mouth 3 (three) times daily with meals. 270 each 1   triamcinolone ointment (KENALOG) 0.1 % Apply 1 Application topically 2 (two) times daily. 30 g 0   warfarin (COUMADIN) 5 MG tablet TAKE 1 TABLET BY MOUTH EVERY DAY 30 tablet 11   No facility-administered medications prior to visit.       Objective:   Physical Exam:  General appearance: 57 y.o., male, NAD, conversant  Eyes: anicteric sclerae; PERRL, tracking appropriately HENT: NCAT; MMM Neck: Trachea midline; no lymphadenopathy, no JVD Lungs: CTAB, no crackles, no wheeze, with normal respiratory effort CV: RRR, no murmur  Abdomen: Soft, non-tender; non-distended, BS present   Extremities: No peripheral edema, warm Skin: Normal turgor and texture; no rash Psych: Appropriate affect Neuro: Alert and oriented to person and place, no focal deficit     There were no vitals filed for this visit.   on *** LPM *** RA BMI Readings from Last 3 Encounters:  09/27/22 25.90 kg/m  06/28/22 25.14 kg/m  12/07/21 24.97 kg/m   Wt Readings from Last 3 Encounters:  06/28/22 165 lb 5.5 oz (75 kg)  12/07/21 164 lb 3.9 oz (74.5 kg)  08/18/21 165 lb (74.8 kg)     CBC    Component Value Date/Time   WBC 10.9 (H) 07/30/2019 0500   RBC 3.10 (L) 07/30/2019 0500   HGB 8.1 (L) 07/30/2019 0500   HGB 11.7 (L) 09/07/2016 1457   HCT 28.3 (L) 07/30/2019 0500   HCT 37.5 09/07/2016 1457   PLT 313 07/30/2019 0500   PLT 279 09/07/2016 1457   MCV 91.3 07/30/2019 0500   MCV 81 09/07/2016 1457   MCH 26.1 07/30/2019 0500   MCHC 28.6 (L) 07/30/2019 0500   RDW 18.7 (H) 07/30/2019 0500   RDW 14.9 09/07/2016 1457   LYMPHSABS 1.0 05/28/2019 1055   LYMPHSABS 1.3 09/07/2016 1457   MONOABS 1.5 (H) 05/28/2019 1055   EOSABS 0.4 05/28/2019 1055   EOSABS 0.2 09/07/2016 1457   BASOSABS 0.1 05/28/2019 1055   BASOSABS 0.0 09/07/2016 1457    ***  Chest Imaging: CT Chest 11/01/22 with low volumes, band formation, faint upper lobe ggos. Though there is increased attenuation of the liver, he has if anything a decreased burden of band formation/scar relative to 2019 CT Chest.   Pulmonary Functions Testing Results:    Latest Ref Rng & Units 11/10/2022    8:43 AM 05/01/2017    8:54 AM  PFT Results  FVC-Pre L 1.61  2.36   FVC-Predicted Pre % 37  63   FVC-Post L 1.55  2.55   FVC-Predicted Post % 35  68   Pre FEV1/FVC % % 82  86   Post FEV1/FCV % % 87  89   FEV1-Pre L 1.32  2.04   FEV1-Predicted Pre % 40  69   FEV1-Post L 1.35  2.26   DLCO uncorrected ml/min/mmHg 0.03  17.82   DLCO UNC% % 0  62   DLCO corrected ml/min/mmHg  20.31   DLCO COR %Predicted %  71   DLVA Predicted % 0  114    TLC L 3.12  4.78   TLC % Predicted % 48  75   RV % Predicted % 76  114    PFT 11/10/22 with severe restriction - decline in FVC, FEV1, TLC, unable to complete DLCO maneuver due to cough   Echocardiogram 03/30/21:   1. Left ventricular ejection fraction, by estimation, is 20 to 25%. The  left ventricle has severely decreased function. The left ventricle  demonstrates global hypokinesis. The left ventricular internal cavity size  was mildly dilated. Left ventricular  diastolic parameters are consistent with Grade II diastolic dysfunction  (pseudonormalization). The inflow cannula of the LVAD was not visualized.  The IV septum is mildly rightward.   2. Right ventricular systolic function is mildly reduced. The right  ventricular size is mildly enlarged.   3. The mitral valve is normal in structure. Trivial mitral valve  regurgitation. No evidence of mitral stenosis.   4. The aortic valve is tricuspid. Aortic valve regurgitation is mild.  Mild aortic valve  sclerosis is present, with no evidence of aortic valve  stenosis. The aortic valve opens with each beat.       Assessment & Plan:    Plan:      Maryjane Hurter, MD [facility] 11/22/2022 10:18 AM

## 2022-11-23 ENCOUNTER — Encounter: Payer: Self-pay | Admitting: Student

## 2022-11-23 ENCOUNTER — Ambulatory Visit: Payer: Medicare Other | Admitting: Student

## 2022-11-23 VITALS — BP 114/62 | HR 65 | Temp 97.7°F

## 2022-11-23 DIAGNOSIS — R053 Chronic cough: Secondary | ICD-10-CM

## 2022-11-23 DIAGNOSIS — R0600 Dyspnea, unspecified: Secondary | ICD-10-CM | POA: Diagnosis not present

## 2022-11-23 DIAGNOSIS — G4719 Other hypersomnia: Secondary | ICD-10-CM | POA: Diagnosis not present

## 2022-11-23 MED ORDER — BENZONATATE 100 MG PO CAPS: 200.0000 mg | ORAL_CAPSULE | Freq: Three times a day (TID) | ORAL | 2 refills | Status: DC | PRN

## 2022-11-23 MED ORDER — FLUTICASONE PROPIONATE 50 MCG/ACT NA SUSP: 1.0000 | Freq: Every day | NASAL | 11 refills | Status: DC

## 2022-11-23 MED ORDER — LEVOCETIRIZINE DIHYDROCHLORIDE 5 MG PO TABS: 5.0000 mg | ORAL_TABLET | Freq: Every evening | ORAL | 5 refills | Status: DC

## 2022-11-23 NOTE — Patient Instructions (Addendum)
-   ABG and DLCO you will be called to schedule this - sleep study at [facility] long you will be called to schedule this - clear nose of crusting and then use flonase 1 spray each nostril daily till next visit - xyzal 5 mg daily till next visit  - tessalon perles as needed for cough suppression, can also try looking for dextromethorphan over the counter - would recommend use especially before your DLCO testing as above - see you in 4 months or sooner if need be!

## 2022-11-23 NOTE — Telephone Encounter (Signed)
Repeat CV [place] alert for sustained VT with successful ATP therapy. 6 VT-1 events occurred between  3/19 @ 12:56 and 3/19 @ 22:49, EGM shows sustained VT, rates 150 - 164, ATP delivered 1-3 bursts converting arrhythmia.   32 NSVT episodes without therapy Therapy zones adjusted 3/13.  Do we need to take further action Dr. Caryl Comes since ATP is working appropriately?

## 2022-11-25 ENCOUNTER — Telehealth (HOSPITAL_COMMUNITY): Payer: Self-pay | Admitting: Unknown Physician Specialty

## 2022-11-25 ENCOUNTER — Other Ambulatory Visit (HOSPITAL_COMMUNITY): Payer: Self-pay | Admitting: Radiology

## 2022-11-25 DIAGNOSIS — R0600 Dyspnea, unspecified: Secondary | ICD-10-CM

## 2022-11-25 DIAGNOSIS — R059 Cough, unspecified: Secondary | ICD-10-CM

## 2022-11-25 DIAGNOSIS — Z95811 Presence of heart assist device: Secondary | ICD-10-CM

## 2022-11-25 DIAGNOSIS — I472 Ventricular tachycardia, unspecified: Secondary | ICD-10-CM

## 2022-11-25 MED ORDER — AMIODARONE HCL 200 MG PO TABS: 200.0000 mg | ORAL_TABLET | Freq: Every day | ORAL | 3 refills | Status: DC

## 2022-11-25 MED ORDER — AMIODARONE HCL 400 MG PO TABS: 400.0000 mg | ORAL_TABLET | Freq: Every day | ORAL | 3 refills | Status: DC

## 2022-11-25 MED ORDER — MEXILETINE HCL 150 MG PO CAPS: 150.0000 mg | ORAL_CAPSULE | Freq: Two times a day (BID) | ORAL | 6 refills | Status: DC

## 2022-11-25 NOTE — Telephone Encounter (Addendum)
Pt called the office this morning stating that his ICD fired x 2 last night. Pts Amio was stopped on 3/8 by Dr Aundra Dubin based on PFT results. Pt was seen by pulmonary on 3/20. D/w Dr Aundra Dubin, he reviewed notes from pulmonary and d/w pts case with EP DR Caryl Comes.  Pt instructed to: Start Amiodarone 400 mg - 1 tablet twice a day for 10 days; then resume a dose of 1 tablet daily (400 mg) Stop Mexiletine when you decrease the Amiodarone to once daily Send a remote transmission when you get home from dialysis  Pt verbalized instructions. I spoke with nurse at dialysis pt had labs drawn on Wednesday. Dialysis center is faxing labs to [facility]. New script sent to pharmacy for Amiodarone 400 mg tablets.  [name] RN, BSN VAD Coordinator 24/7 Pager [phone]

## 2022-11-25 NOTE — Addendum Note (Signed)
Addended by: [name] B on: 11/25/2022 11:26 AM   Modules accepted: Orders

## 2022-11-28 ENCOUNTER — Ambulatory Visit (HOSPITAL_COMMUNITY)
Admission: RE | Admit: 2022-11-28 | Discharge: 2022-11-28 | Disposition: A | Discharge status: Home / Self Care | Payer: Medicare Other | Source: Ambulatory Visit | Attending: Internal Medicine | Admitting: Internal Medicine

## 2022-11-28 ENCOUNTER — Telehealth (HOSPITAL_COMMUNITY): Payer: Self-pay | Admitting: Unknown Physician Specialty

## 2022-11-28 ENCOUNTER — Encounter (HOSPITAL_COMMUNITY): Payer: Self-pay | Admitting: Internal Medicine

## 2022-11-28 ENCOUNTER — Telehealth: Payer: Self-pay

## 2022-11-28 ENCOUNTER — Other Ambulatory Visit (HOSPITAL_COMMUNITY): Payer: Self-pay | Admitting: *Deleted

## 2022-11-28 ENCOUNTER — Encounter (HOSPITAL_COMMUNITY): Payer: Medicare Other

## 2022-11-28 VITALS — BP 112/88 | HR 78

## 2022-11-28 DIAGNOSIS — I2510 Atherosclerotic heart disease of native coronary artery without angina pectoris: Secondary | ICD-10-CM | POA: Insufficient documentation

## 2022-11-28 DIAGNOSIS — I4892 Unspecified atrial flutter: Secondary | ICD-10-CM | POA: Diagnosis not present

## 2022-11-28 DIAGNOSIS — I252 Old myocardial infarction: Secondary | ICD-10-CM | POA: Insufficient documentation

## 2022-11-28 DIAGNOSIS — I132 Hypertensive heart and chronic kidney disease with heart failure and with stage 5 chronic kidney disease, or end stage renal disease: Secondary | ICD-10-CM | POA: Insufficient documentation

## 2022-11-28 DIAGNOSIS — I5022 Chronic systolic (congestive) heart failure: Secondary | ICD-10-CM

## 2022-11-28 DIAGNOSIS — Z992 Dependence on renal dialysis: Secondary | ICD-10-CM | POA: Diagnosis not present

## 2022-11-28 DIAGNOSIS — D6851 Activated protein C resistance: Secondary | ICD-10-CM | POA: Diagnosis not present

## 2022-11-28 DIAGNOSIS — K3184 Gastroparesis: Secondary | ICD-10-CM | POA: Insufficient documentation

## 2022-11-28 DIAGNOSIS — G8929 Other chronic pain: Secondary | ICD-10-CM | POA: Diagnosis not present

## 2022-11-28 DIAGNOSIS — Z89611 Acquired absence of right leg above knee: Secondary | ICD-10-CM | POA: Diagnosis not present

## 2022-11-28 DIAGNOSIS — Z89612 Acquired absence of left leg above knee: Secondary | ICD-10-CM | POA: Diagnosis not present

## 2022-11-28 DIAGNOSIS — E1143 Type 2 diabetes mellitus with diabetic autonomic (poly)neuropathy: Secondary | ICD-10-CM | POA: Diagnosis not present

## 2022-11-28 DIAGNOSIS — E1122 Type 2 diabetes mellitus with diabetic chronic kidney disease: Secondary | ICD-10-CM | POA: Insufficient documentation

## 2022-11-28 DIAGNOSIS — I472 Ventricular tachycardia, unspecified: Secondary | ICD-10-CM

## 2022-11-28 DIAGNOSIS — N186 End stage renal disease: Secondary | ICD-10-CM | POA: Insufficient documentation

## 2022-11-28 DIAGNOSIS — Z7901 Long term (current) use of anticoagulants: Secondary | ICD-10-CM | POA: Insufficient documentation

## 2022-11-28 DIAGNOSIS — Z95811 Presence of heart assist device: Secondary | ICD-10-CM | POA: Diagnosis not present

## 2022-11-28 DIAGNOSIS — I255 Ischemic cardiomyopathy: Secondary | ICD-10-CM | POA: Diagnosis not present

## 2022-11-28 DIAGNOSIS — Z79899 Other long term (current) drug therapy: Secondary | ICD-10-CM | POA: Insufficient documentation

## 2022-11-28 NOTE — Progress Notes (Addendum)
Patient presents for sick visit following ICD shock on 3/22 with his son. Denies issues with VAD equipment.   Denies lightheadedness, dizziness, falls, chest pain, and signs of bleeding. [name] transferred him from wheelchair to stretcher today. Reports good PO intake. PRN Oxy provided by pain clinic for back and groin pain- pain well managed. States he is tired this afternoon.   Pt with multiple VT/VF episodes on remote interrogation. ATP paced out of several occurences. Pt reports on Friday prior to ICD shock he was lightheaded and dizzy. Symptoms resolved after shock. Pt confirms that he is taking Amio 400 BID x 10 days, then resume 400 mg daily,  and Mexiletine 150 mg BID, then stop when decrease Amiodarone.   Bedside handheld echo performed- VAD speed decreased to 5500. RAMP echo schedule for Thursday to further assess. Per Dr Haroldine Laws will trial increasing dry weight by 0.5 kg during HD treatments.   Pt confirms he is taking Cefadroxil three times a week. Drive line site improved since last visit. Pt has repeated trauma to driveline as he refuses to wear anchor. Dressing changed in clinic today. See documentation below.   Labs from dialysis 11/17/22 reviewed by Dr Haroldine Laws.   Vital Signs:  Doppler Pressure: 100 Automatc BP: 112/88 (96) HR: 78 paced SPO2: 99% on RA   Weight: UTO lb w/o eqt Last weight: 168.3 lb pt reported from dialysis center (dry wt: 77.2 kg)   VAD Indication: Destination therapy- Pt is no longer candidate for heart/kidney transplant due to PAD per [facility].    VAD interrogation & Equipment Management: Speed: 5600 Flow: 4.4 Power: 4.4 w    PI: 1.8 Hct: dropped to 20 today- do not titrate   Alarms: multiple LOW FLOWs today.  Events: 100 + on HD days  Fixed speed: 5600 Low speed limit: 5300   Primary Controller: Replace back up battery in 9 months Back up controller:   Replace back up battery in 11 months   Annual Equipment Maintenance on UBC/PM was  performed on 02/09/22.    I reviewed the LVAD parameters from today and compared the results to the patient's prior recorded data. LVAD interrogation was NEGATIVE for significant power changes, NEGATIVE for clinical alarms and STABLE for PI events/speed drops. No programming changes were made and pump is functioning within specified parameters. Pt is performing daily controller and system monitor self tests along with completing weekly and monthly maintenance for LVAD equipment.   LVAD equipment check completed and is in good working order. Back-up equipment present.   Exit Site Care: Drive line is being maintained daily by daughter. He is not wearing an anchor- refuses to wear. Existing VAD dressing removed and site care performed using sterile technique. Drive line exit site cleaned with Vashe and allowed to dry. Then site cleansed with Chlora prep applicators x 2, allowed to dry, then rinsed with saline, and allowed to dry. NO silver strip applied. Scant amount of brown drainage noted on gauze. No redness, rash, pain or foul odor noted. Pt denies fever or chills. Continue every other day dressing changes. Pt has adequate dressing supplies at home.   Significant Events on VAD Support:  -10/2016>> poss drive line infection, CT ABD neg, ID consult-doxy -11/09/16>> admit for poss drive line infection, IV abx -03/24/17>> doxy for poss drive line infection -05/04/17>> drive line debridement with wound-vac -06/2017>> drive line +proteus, IV abx, Bactrim x14 days  Device: St jude Dual Therapies: on 200 bpm Last check: 11/25/22  BP & Labs:  Doppler BP [id number] -modified systolic. Has not taken medications yet today.   Hgb 10.7 on HD labs 11/17/22- No S/S of bleeding. Specifically denies melena/BRBPR or nosebleeds.   LDH 238 on HD labs 11/17/22 - Denies tea-colored urine. No power elevations noted on interrogation.    Patient Instructions:  No medication changes today Return for ramp echo on Thursday 3/28 at  [place] Coordinator  Office: [phone]  24/7 Pager: [phone]

## 2022-11-28 NOTE — Progress Notes (Signed)
LVAD Clinic Note  Cardiology: Dr. Aundra Dubin  HPI:   [name] is a 57 y.o. with history of CAD s/p anterior MI in 2010 and NSTEMI in 3/18 with DES to pRCA and mLAD and ischemic cardiomyopathy now s/p Heartmate 3 LVAD and ESRD presents for followup of CHF/LVAD.    Patient developed low output heart failure.  He was not deemed to be a good transplant candidate.  He was admitted in 10/18 and Heartmate 3 LVAD was placed.  He had baseline CKD stage 3 likely from diabetic nephropathy and CHF.  He developed peri-op AKI and ended up on dialysis.   Admitted 6/19 for emergent VAD exchange 03/01/18 in the setting of HM3 outflow graft kink. He required CVVHD post op, but was transtioned back to [facility] without problems.   Subsequently developed ischemic LEs and underwent sequential bilateral AKAs.   Patient had episode of VT in 2/23 with ICD shock.  He was started on amiodarone.  He had another ICD discharge with VF in 10/23 in the setting of significant emotional stress (grandson had died).   Patient presents for sick visit following ICD shock on 3/22 with his son. Denies issues with VAD equipment.   He is here for work-in visit with his son due to recent ICD shock and low flow alarms on VAD.   Recently found to have multiple VT/VF episodes on remote interrogation. ATP paced out of several occurences. Contacted by EP and amio restarted at 400 BID (had been off)  Pt reports on Friday prior to ICD shock he was lightheaded and dizzy. Symptoms resolved after shock. Pt confirms that he is taking Amio 400 BID x 10 days, then switch to 400 mg daily. Also on Mexiletine 150 mg BID with a plan to stop when amio lowered back down per his report.  Pt confirms he is taking Cefadroxil three times a week. Drive line site improved since last visit. Pt has repeated trauma to driveline as he refuses to wear anchor. Dressing changed in clinic today. See documentation below.    Says he feels ok. Denies CP or SOB. Weight stable on  HD. Reports good PO intake. PRN Oxy provided by pain clinic for back and groin pain- pain well managed. Denies orthopnea or PND. No fevers, chills or problems with driveline. No bleeding, melena or neuro symptoms. Having frequent PI events and Low Flow alarms on VAD, Taking all meds as prescribed.   I did POCUS echo in clinic. Difficult windows LV small. Cannula midline. AoV not opening. RV moderately to severely down. - VAD speed decreased to 5500  VAD Indication: Destination therapy- Pt is no longer candidate for heart/kidney transplant due to PAD per [facility].    VAD interrogation & Equipment Management: Speed: 5600 Flow: 4.4 Power: 4.4 w    PI: 1.8 Hct: dropped to 20 today- do not titrate   Alarms: multiple LOW FLOWs today.  Events: 100 + on HD days  Fixed speed: 5600 Low speed limit: 5300   Primary Controller: Replace back up battery in 9 months Back up controller:   Replace back up battery in 11 months   Annual Equipment Maintenance on UBC/PM was performed on 02/09/22.  PMH: 1. Chronic systolic CHF: Ischemic cardiomyopathy.  St Jude ICD.  - Echo (11/17) with EF 25-30%, moderate diastolic dysfunction, mild MR, mildly dilated RV with moderately decreased systolic function, peak RV-RA gradient 48 mmHg.  - Echo (3/18) with EF 25-30%, wall motion abnormalities, normal RV size and systolic function.  -  Admission 3/18 with cardiogenic shock and AKI, required milrinone gtt.  - Echo (8/18) with EF 20-25%, mild LVH, moderate LV dilation, mild MR.  - CPX (8/18) with peak VO2 12.4, VE/VCO2 slope 35, RER 1.05 => severe HR limitation.  - Heartmate 3 LVAD 10/18.  - Pump thrombosis in setting of HM3 outflow graft kink.  Pump exchange 6/19 for HM3.  2. CAD: Anterior MI 2005, had bifurcation stenting LAD/diagonal.  Repeat cath 2010 with patent stents.  - NSTEMI 3/18: LHC with 99% ulcerated lesion proximal RCA, 95% mid LAD => PCI with DES to proximal RCA and mid LAD.  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   - Lower extremity venous dopplers (8/18): chronic DVT - V/Q scan (8/18): Normal, no PE.  4. Crohns disease: History of colectomy.  5. GERD 6. Diabetic gastroparesis: Admission 11/18 with abdominal pain, nausea/vomiting.  - [facility] workup 2019: Gastric emptying study near normal.  Negative breath test (no evidence for small bowel bacterial overgrowth).  Electrogastrogram showed poor gastric accomodation/capacity, "bradygastria."  7. HTN 8. Hyperlipidemia 9. Type II diabetes  10. ESRD: Post-op LVAD surgery.  He has had failed AV fistula and AV graft, now dialyzing via catheter . 11. Carotid dopplers (8/18): mild bilateral stenosis.  12. PFTs (8/18): Mild restriction (no IDL on CT chest).  13. Lung nodules: CT chest 8/18 with small bilateral nodules.  14. PAD: ABIs with moderate bilateral disease in 8/18.  - Peripheral angiogram 03/18/19 showed 80% right SFA stenosis and occluded right PT.  He had stenting of SFA and PTCA of PT with restoration of flow.  However, he still needed amputation right 4th toe on 03/21/19 with intractable pain.  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  - Peripheral angiogram left leg: DES to left SFA, only 1 vessel runoff via severely diseased peroneal artery, unable to intervene. 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  - Left AKA 11/20.  15. Atrial flutter: In 11/18 associated with nausea/vomiting from gastroparesis.  16. VT: VT with ICD shock in 11/22.  - VT with ICD shock in 2/23, amiodarone started.  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 Please take 1 tablet by mouth twice daily for 14 days then resume a dose of 1 tablet daily 90 tablet 3   aspirin EC 81 MG tablet Take 1 tablet (81 mg total) by mouth daily. 90 tablet 3   atorvastatin (LIPITOR) 40 MG tablet Take 1 tablet (40 mg total) by mouth daily at 6 PM. 90 tablet 3    benzonatate (TESSALON) 100 MG capsule Take 2 capsules (200 mg total) by mouth 3 (three) times daily as needed for cough. 30 capsule 2   calcitRIOL (ROCALTROL) 0.5 MCG capsule Take 1 capsule (0.5 mcg total) by mouth every Monday, Wednesday, and Friday. 15 capsule 5   cefadroxil (DURICEF) 500 MG capsule Take 2 capsules (1,000 mg total) by mouth 3 (three) times a week. Take AFTER dialysis sessions 24 capsule 11   citalopram (CELEXA) 20 MG tablet Take 1 tablet (20 mg total) by mouth daily. 30 tablet 6   COLACE 100 MG capsule Take 100 mg by mouth daily as needed for moderate constipation.     fluticasone (FLONASE) 50 MCG/ACT nasal spray Place 1 spray into both nostrils daily. 16 g 11   hydrALAZINE (APRESOLINE) 25 MG tablet Take 1 tablet three times a day on NON-dialysis  days only 270 tablet 3   insulin glargine (LANTUS) 100 UNIT/ML injection Inject 0.35 mLs (35 Units total) into the skin every morning. 3 mL 11   levocetirizine (XYZAL) 5 MG tablet Take 1 tablet (5 mg total) by mouth every evening. 30 tablet 5   metoCLOPramide (REGLAN) 10 MG tablet Take 1 tablet (10 mg total) by mouth 3 (three) times daily. (Patient taking differently: Take 10 mg by mouth 3 (three) times daily before meals.) 90 tablet 6   mexiletine (MEXITIL) 150 MG capsule Take 1 capsule (150 mg total) by mouth 2 (two) times daily. Please stop once you decrease Amio to 1 tablet daily 60 capsule 6   midodrine (PROAMATINE) 5 MG tablet Take 2 tablets (10 mg total) by mouth every Monday, Wednesday, and Friday. Take 1 tablet before dialysis treatment 70 tablet 6   multivitamin (RENA-VIT) TABS tablet Take 1 tablet by mouth daily.     oxyCODONE-acetaminophen (PERCOCET) 7.5-325 MG tablet Take 1 tablet by mouth 3 (three) times daily as needed for pain.     pantoprazole (PROTONIX) 40 MG tablet Take 1 tablet (40 mg total) by mouth 2 (two) times daily before lunch and supper. 60 tablet 11   sevelamer carbonate (RENVELA) 0.8 g PACK packet Take 0.8 g by  mouth 3 (three) times daily with meals. 270 each 1   triamcinolone ointment (KENALOG) 0.1 % Apply 1 Application topically 2 (two) times daily. 30 g 0   warfarin (COUMADIN) 5 MG tablet TAKE 1 TABLET BY MOUTH EVERY DAY 30 tablet 11   LORazepam (ATIVAN) 0.5 MG tablet Take 1 tablet (0.5 mg total) by mouth every 12 (twelve) hours as needed (nausea). (Patient not taking: Reported on 11/28/2022) 60 tablet 0   nitroGLYCERIN (NITROSTAT) 0.4 MG SL tablet Place 1 tablet (0.4 mg total) under the tongue every 5 (five) minutes as needed for chest pain. 90 tablet 3   ondansetron (ZOFRAN-ODT) 4 MG disintegrating tablet Take by mouth as needed. (Patient not taking: Reported on 11/28/2022)     promethazine (PHENERGAN) 12.5 MG tablet TAKE 1 TABLET (12.5 MG TOTAL) BY MOUTH EVERY 6 (SIX) HOURS AS NEEDED FOR NAUSEA OR VOMITING. (Patient not taking: Reported on 11/28/2022) 30 tablet 3   No current facility-administered medications for this encounter.    Metformin and related   Review of systems complete and found to be negative unless listed in HPI.       Vital Signs:  Doppler Pressure: 100 Automatc BP: 112/88 (96) HR: 78 paced SPO2: 99% on RA   Weight: UTO lb w/o eqt Last weight: 168.3 lb pt reported from dialysis center (dry wt: 77.2 kg)   General:  NAD.  Weak appearing  HEENT: normal  Neck: supple. JVP not elevated.   Carotids 2+ bilat; no bruits. No lymphadenopathy or thryomegaly appreciated. Cor: LVAD hum.  Left chest HD cath  Lungs: Clear. Abdomen: obese soft, nontender, non-distended. No hepatosplenomegaly. No bruits or masses. Good bowel sounds. Driveline site clean. Anchor in place.  Extremities: s/p bilateral AKS  Neuro: alert & oriented x 3. No focal deficits. Moves all 4 without problem   ASSESSMENT AND PLAN: 1. Frequent VT with ICD shock - He was started on amiodarone after 2/23 ICD shock. VF again with shock in 10/23 in setting of extreme stress - EP managing now on amio 400 bid x 10 dayd +  mexilitene 150 bid. Plan to stop mexilitene once amio weaned down to 400 bid - multiple low flows on VAD with PI  events. ? Suction events triggering - POCUS echo today as above. Speed turned down to 5500 - Full ramp echo on Thursday - Check labs  - ICD interrogated personally in clinic - Will ask Dialysis center to let weight drift up 0.5-1KG to prevent suction 2. Chronic systolic CHF: Ischemic cardiomyopathy.  St Jude ICD.  NSTEMI in March 2018 with DES to LAD and RCA, complicated by cardiogenic shock, low output requiring milrinone.  Echo 8/18 with EF 20-25%, moderate MR. CPX (8/18) with severe functional impairment due to HF.  He is s/p HM3 LVAD placement. He had pump thrombosis 6/19 due to [facility] outflow graft kinking.  He had pump exchange for another HM3.  LVAD parameters stable but he does have PI events and low flow events at times with HD. He takes midodrine pre-HD, but needs hydralazine on non-HD days.  MAP high today but has not had hydralazine today.   - Continue midodrine 10 mg pre-HD - Volume managed at dialysis, MWF.  Would be careful to avoid excess UF given LVAD. See plan as above  - Continue ASA 81 and warfarin with INR goal 2-2.5.    - Continue hydralazine 25 mg tid on non-HD days. - He is not candidate for transplant now given extensive PAD with amputations.   3. ESRD: HD on M-W-F with midodrine pre-HD.  - Need to allow UF as long as MAP > 70 (would not stop for MAP 70-80).   - With concern for suction events. Will ask Dialysis center to let weight drift up 0.5-1KG to prevent suction 4.  CAD: NSTEMI in 3/18.  LHC with 99% ulcerated lesion proximal RCA with left to right collaterals, 95% mid LAD stenosis after mid LAD stent.  s/p PCI to RCA and LAD on 11/25/16.  - Continue statin and ASA.  5. h/o DVTs: Factor V Leiden heterozygote. - Anticoagulated. 6. Diabetic gastroparesis: He has seen gastroparesis specialist at [facility]. Surprisingly, gastric emptying study was normal.  However, electrogastrogram showed poor gastric accomodation/capacity. Therefore, small frequent meals recommended.  He has been doing much better with this, minimal symptoms.  - Continue reglan 10 mg tid/ac, now off Marinol.  - Continue Protonix bid.  - Limit narcotic use as much as possible.  7. HTN: Takes hydralazine 25 tid on non-HD days. MAP high today but has not had hydralazine.  - Take hydralazine as soon as you get home.  - Continue midodrine 10 mg pre-HD as above.  8. Chronic pain: Now seen in a pain clinic.  9. Atrial flutter: Paroxysmal.  Not recent recurrence 10. PAD/ischemic feet:  He is now s/p bilateral AKAs.  Able to wheel his chair and do transfers without dyspnea.  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  - Will continue cefadroxil long-term (three days/week after HD).  This will be followed by ID.  - site ok today  Total time spent 45 minutes. Over half that time spent discussing above.    [name] MD 11/28/2022

## 2022-11-28 NOTE — Telephone Encounter (Signed)
Spoke with pt today, he is at dialysis. Pt tells me that he did receive another shock on Friday afternoon before starting the Amiodarone. Since starting Amio he has not had anymore shocks. Pt states that his flow was 3.1 this morning prior to going to dialysis. He states that he has had several low flows at dialysis today. The nurse has given him back 200 cc of NS. His BP there is 94/61. D/w Dr Haroldine Laws, we will bring the pt into clinic this afternoon at 2p to assess.  [name] RN, BSN VAD Coordinator 24/7 Pager [phone]

## 2022-11-28 NOTE — Telephone Encounter (Signed)
Alert received from [employer]:  Alert remote reviewed. Normal device function.   Numerous episodes of VT with 1-6 bursts of ATP, one episode did go on to give an ICD shock, sent to triage.   Pt will be seen in VAD clinic today for assessment.

## 2022-11-28 NOTE — Patient Instructions (Addendum)
No medication changes today Return for ramp echo on Thursday 3/28 at 0900

## 2022-11-29 ENCOUNTER — Ambulatory Visit (HOSPITAL_COMMUNITY): Payer: Self-pay | Admitting: Pharmacist

## 2022-11-29 LAB — POCT INR: INR: 2.2 (ref 2.0–3.0)

## 2022-11-30 ENCOUNTER — Inpatient Hospital Stay (HOSPITAL_COMMUNITY): Admission: RE | Admit: 2022-11-30 | Payer: Medicare Other | Source: Ambulatory Visit

## 2022-11-30 ENCOUNTER — Ambulatory Visit (HOSPITAL_COMMUNITY)
Admission: RE | Admit: 2022-11-30 | Discharge: 2022-11-30 | Disposition: A | Discharge status: Home / Self Care | Payer: Medicare Other | Source: Ambulatory Visit | Attending: Student | Admitting: Student

## 2022-11-30 DIAGNOSIS — R0600 Dyspnea, unspecified: Secondary | ICD-10-CM | POA: Insufficient documentation

## 2022-11-30 DIAGNOSIS — Z992 Dependence on renal dialysis: Secondary | ICD-10-CM | POA: Insufficient documentation

## 2022-11-30 DIAGNOSIS — Z95811 Presence of heart assist device: Secondary | ICD-10-CM | POA: Insufficient documentation

## 2022-11-30 DIAGNOSIS — N186 End stage renal disease: Secondary | ICD-10-CM | POA: Insufficient documentation

## 2022-11-30 DIAGNOSIS — I5022 Chronic systolic (congestive) heart failure: Secondary | ICD-10-CM | POA: Insufficient documentation

## 2022-11-30 DIAGNOSIS — I472 Ventricular tachycardia, unspecified: Secondary | ICD-10-CM | POA: Insufficient documentation

## 2022-11-30 DIAGNOSIS — R053 Chronic cough: Secondary | ICD-10-CM

## 2022-12-01 ENCOUNTER — Ambulatory Visit (HOSPITAL_BASED_OUTPATIENT_CLINIC_OR_DEPARTMENT_OTHER)
Admission: RE | Admit: 2022-12-01 | Discharge: 2022-12-01 | Disposition: A | Discharge status: Home / Self Care | Payer: Medicare Other | Source: Ambulatory Visit | Attending: Internal Medicine | Admitting: Internal Medicine

## 2022-12-01 VITALS — BP 162/0 | HR 85 | Ht 68.0 in

## 2022-12-01 DIAGNOSIS — I517 Cardiomegaly: Secondary | ICD-10-CM | POA: Diagnosis not present

## 2022-12-01 DIAGNOSIS — I4892 Unspecified atrial flutter: Secondary | ICD-10-CM | POA: Insufficient documentation

## 2022-12-01 DIAGNOSIS — R0600 Dyspnea, unspecified: Secondary | ICD-10-CM | POA: Diagnosis present

## 2022-12-01 DIAGNOSIS — N186 End stage renal disease: Secondary | ICD-10-CM

## 2022-12-01 DIAGNOSIS — Z992 Dependence on renal dialysis: Secondary | ICD-10-CM | POA: Diagnosis present

## 2022-12-01 DIAGNOSIS — Z95811 Presence of heart assist device: Secondary | ICD-10-CM

## 2022-12-01 DIAGNOSIS — I472 Ventricular tachycardia, unspecified: Secondary | ICD-10-CM

## 2022-12-01 DIAGNOSIS — R053 Chronic cough: Secondary | ICD-10-CM | POA: Diagnosis present

## 2022-12-01 DIAGNOSIS — I351 Nonrheumatic aortic (valve) insufficiency: Secondary | ICD-10-CM | POA: Diagnosis not present

## 2022-12-01 DIAGNOSIS — I5022 Chronic systolic (congestive) heart failure: Secondary | ICD-10-CM | POA: Diagnosis present

## 2022-12-01 LAB — ECHOCARDIOGRAM LIMITED
Est EF: <20
S' Lateral: 5.1 cm

## 2022-12-01 NOTE — Progress Notes (Signed)
Echocardiogram 2D Echocardiogram has been performed.  [name] 12/01/2022, 9:36 AM

## 2022-12-01 NOTE — Patient Instructions (Signed)
LVAD Clinic Note  Cardiology: Dr. Aundra Dubin  HPI:   [name] is a 57 y.o. with history of CAD s/p anterior MI in 2010 and NSTEMI in 3/18 with DES to pRCA and mLAD and ischemic cardiomyopathy now s/p Heartmate 3 LVAD and ESRD presents for followup of CHF/LVAD.    Patient developed low output heart failure.  He was not deemed to be a good transplant candidate.  He was admitted in 10/18 and Heartmate 3 LVAD was placed.  He had baseline CKD stage 3 likely from diabetic nephropathy and CHF.  He developed peri-op AKI and ended up on dialysis.   Admitted 6/19 for emergent VAD exchange 03/01/18 in the setting of HM3 outflow graft kink. He required CVVHD post op, but was transtioned back to [facility] without problems.   Subsequently developed ischemic LEs and underwent sequential bilateral AKAs.   Patient had episode of VT in 2/23 with ICD shock.  He was started on amiodarone.  He had another ICD discharge with VF in 10/23 in the setting of significant emotional stress (grandson had died).   Patient presents for sick visit following ICD shock on 3/22 with his son. Denies issues with VAD equipment.   He is here for work-in visit with his son due to recent ICD shock and low flow alarms on VAD.   Recently found to have multiple VT/VF episodes on remote interrogation. ATP paced out of several occurences. Contacted by EP and amio restarted at 400 BID (had been off)  Pt reports on Friday prior to ICD shock he was lightheaded and dizzy. Symptoms resolved after shock. Pt confirms that he is taking Amio 400 BID x 10 days, then switch to 400 mg daily. Also on Mexiletine 150 mg BID with a plan to stop when amio lowered back down per his report.   Pt confirms he is taking Cefadroxil three times a week. Drive line site improved since last visit. Pt has repeated trauma to driveline as he refuses to wear anchor. Dressing changed in clinic today. See documentation below.    Patient seen earlier this week with multiple  episodes of VT/VF om his ICD and low flow alarms on VAD. Amio increased. VAD speed turned down 5600-> 5500 and told to increase his dry weight by 1-2 pounds at HD. He returns to day for f/u and for ramp echo. Says he feels good. Weight increased 1-2 pounds at HD. No ICD shocks. No edema, orthopnea or PND.   Ramp echo: LVAD well positioned in LV. RV moderate to severe HK. RVOT VTI 10.2 Septum pulling in on 5500. AoV not opening -> speed decreased to 5400    VAD Indication: Destination therapy- Pt is no longer candidate for heart/kidney transplant due to PAD per [facility].    VAD interrogation & Equipment Management: Speed: 5400 Flow: 2.8 Power: 4.3 w    PI: 9.8 Hct: dropped to 20 today- do not titrate   Alarms: LOW FLOW @ 0530 this morning Events: 50 + on HD days  Fixed speed: 5400 Low speed limit: 5100   Primary Controller: Replace back up battery in 9 months Back up controller:   Replace back up battery in 11 months   Annual Equipment Maintenance on UBC/PM was performed on 02/09/22.   PMH: 1. Chronic systolic CHF: Ischemic cardiomyopathy.  St Jude ICD.  - Echo (11/17) with EF 25-30%, moderate diastolic dysfunction, mild MR, mildly dilated RV with moderately decreased systolic function, peak RV-RA gradient 48 mmHg.  - Echo (3/18) with  EF 25-30%, wall motion abnormalities, normal RV size and systolic function.  - Admission 3/18 with cardiogenic shock and AKI, required milrinone gtt.  - Echo (8/18) with EF 20-25%, mild LVH, moderate LV dilation, mild MR.  - CPX (8/18) with peak VO2 12.4, VE/VCO2 slope 35, RER 1.05 => severe HR limitation.  - Heartmate 3 LVAD 10/18.  - Pump thrombosis in setting of HM3 outflow graft kink.  Pump exchange 6/19 for HM3.  2. CAD: Anterior MI 2005, had bifurcation stenting LAD/diagonal.  Repeat cath 2010 with patent stents.  - NSTEMI 3/18: LHC with 99% ulcerated lesion proximal RCA, 95% mid LAD => PCI with DES to proximal RCA and mid LAD.  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  - Lower extremity venous dopplers (8/18): chronic DVT - V/Q scan (8/18): Normal, no PE.  4. Crohns disease: History of colectomy.  5. GERD 6. Diabetic gastroparesis: Admission 11/18 with abdominal pain, nausea/vomiting.  - [facility] workup 2019: Gastric emptying study near normal.  Negative breath test (no evidence for small bowel bacterial overgrowth).  Electrogastrogram showed poor gastric accomodation/capacity, "bradygastria."  7. HTN 8. Hyperlipidemia 9. Type II diabetes  10. ESRD: Post-op LVAD surgery.  He has had failed AV fistula and AV graft, now dialyzing via catheter . 11. Carotid dopplers (8/18): mild bilateral stenosis.  12. PFTs (8/18): Mild restriction (no IDL on CT chest).  13. Lung nodules: CT chest 8/18 with small bilateral nodules.  14. PAD: ABIs with moderate bilateral disease in 8/18.  - Peripheral angiogram 03/18/19 showed 80% right SFA stenosis and occluded right PT.  He had stenting of SFA and PTCA of PT with restoration of flow.  However, he still needed amputation right 4th toe on 03/21/19 with intractable pain.  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  - Peripheral angiogram left leg: DES to left SFA, only 1 vessel runoff via severely diseased peroneal artery, unable to intervene. 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  - Left AKA 11/20.  15. Atrial flutter: In 11/18 associated with nausea/vomiting from gastroparesis.  16. VT: VT with ICD shock in 11/22.  - VT with ICD shock in 2/23, amiodarone started.  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 Please take 1 tablet by mouth twice daily for 14 days then resume a dose of 1 tablet daily 90 tablet 3   aspirin EC 81 MG tablet Take 1 tablet (81 mg total) by mouth daily. 90 tablet 3   atorvastatin (LIPITOR) 40 MG tablet Take 1 tablet (40 mg total) by mouth daily at 6 PM. 90 tablet  3   benzonatate (TESSALON) 100 MG capsule Take 2 capsules (200 mg total) by mouth 3 (three) times daily as needed for cough. 30 capsule 2   calcitRIOL (ROCALTROL) 0.5 MCG capsule Take 1 capsule (0.5 mcg total) by mouth every Monday, Wednesday, and Friday. 15 capsule 5   cefadroxil (DURICEF) 500 MG capsule Take 2 capsules (1,000 mg total) by mouth 3 (three) times a week. Take AFTER dialysis sessions 24 capsule 11   citalopram (CELEXA) 20 MG tablet Take 1 tablet (20 mg total) by mouth daily. 30 tablet 6   COLACE 100 MG capsule Take 100 mg by mouth daily as needed for moderate constipation.     fluticasone (FLONASE) 50 MCG/ACT nasal spray Place 1 spray into both nostrils daily. 16 g 11   hydrALAZINE (  APRESOLINE) 25 MG tablet Take 1 tablet three times a day on NON-dialysis days only 270 tablet 3   insulin glargine (LANTUS) 100 UNIT/ML injection Inject 0.35 mLs (35 Units total) into the skin every morning. 3 mL 11   levocetirizine (XYZAL) 5 MG tablet Take 1 tablet (5 mg total) by mouth every evening. 30 tablet 5   LORazepam (ATIVAN) 0.5 MG tablet Take 1 tablet (0.5 mg total) by mouth every 12 (twelve) hours as needed (nausea). (Patient not taking: Reported on 11/28/2022) 60 tablet 0   metoCLOPramide (REGLAN) 10 MG tablet Take 1 tablet (10 mg total) by mouth 3 (three) times daily. (Patient taking differently: Take 10 mg by mouth 3 (three) times daily before meals.) 90 tablet 6   mexiletine (MEXITIL) 150 MG capsule Take 1 capsule (150 mg total) by mouth 2 (two) times daily. Please stop once you decrease Amio to 1 tablet daily 60 capsule 6   midodrine (PROAMATINE) 5 MG tablet Take 2 tablets (10 mg total) by mouth every Monday, Wednesday, and Friday. Take 1 tablet before dialysis treatment 70 tablet 6   multivitamin (RENA-VIT) TABS tablet Take 1 tablet by mouth daily.     nitroGLYCERIN (NITROSTAT) 0.4 MG SL tablet Place 1 tablet (0.4 mg total) under the tongue every 5 (five) minutes as needed for chest pain. 90  tablet 3   ondansetron (ZOFRAN-ODT) 4 MG disintegrating tablet Take by mouth as needed. (Patient not taking: Reported on 11/28/2022)     oxyCODONE-acetaminophen (PERCOCET) 7.5-325 MG tablet Take 1 tablet by mouth 3 (three) times daily as needed for pain.     pantoprazole (PROTONIX) 40 MG tablet Take 1 tablet (40 mg total) by mouth 2 (two) times daily before lunch and supper. 60 tablet 11   promethazine (PHENERGAN) 12.5 MG tablet TAKE 1 TABLET (12.5 MG TOTAL) BY MOUTH EVERY 6 (SIX) HOURS AS NEEDED FOR NAUSEA OR VOMITING. (Patient not taking: Reported on 11/28/2022) 30 tablet 3   sevelamer carbonate (RENVELA) 0.8 g PACK packet Take 0.8 g by mouth 3 (three) times daily with meals. 270 each 1   triamcinolone ointment (KENALOG) 0.1 % Apply 1 Application topically 2 (two) times daily. 30 g 0   warfarin (COUMADIN) 5 MG tablet TAKE 1 TABLET BY MOUTH EVERY DAY 30 tablet 11   No current facility-administered medications for this encounter.    Metformin and related   Review of systems complete and found to be negative unless listed in HPI.     Vital Signs:  Doppler Pressure: 162 Automatc BP: 155/124 (135) HR:85 paced SPO2: 100% on RA   Weight: UTO lb w/o eqt Last weight: 168.3 lb pt reported from dialysis center (dry wt: 77.7 kg)   Exam General:  NAD.  HEENT: normal  Neck: supple. JVP not elevated.  Carotids 2+ bilat; no bruits. No lymphadenopathy or thryomegaly appreciated. Cor: LVAD hum.  Lungs: Clear. Abdomen: obese soft, nontender, non-distended. No hepatosplenomegaly. No bruits or masses. Good bowel sounds. Driveline site clean. Anchor in place.  Extremities: no cyanosis, clubbing, rash. Warm no edema  s/p B AKA Neuro: alert & oriented x 3. No focal deficits.   ASSESSMENT AND PLAN: 1. Frequent VT with ICD shock - He was started on amiodarone after 2/23 ICD shock. VF again with shock in 10/23 in setting of extreme stress - EP managing now on amio 400 bid x 10 dayd + mexilitene 150 bid.  Plan to stop mexilitene once amio weaned down to 400 bid - multiple low flows  on VAD with PI events. ? Suction events triggering - Ramp echo today (12/01/22): LVAD well positioned in LV. RV moderate to severe HK. RVOT VTI 10.2 Septum pulling in on 5500. AoV not opening -> speed decreased to 5400 - Labs from HD ok - ICD interrogated personally in clinic. VT improved with increased dose amio - Dry weight increased 1-2 pound at HD. Can increase a bit more as needed 2. Chronic systolic CHF: Ischemic cardiomyopathy.  St Jude ICD.  NSTEMI in March 2018 with DES to LAD and RCA, complicated by cardiogenic shock, low output requiring milrinone.  Echo 8/18 with EF 20-25%, moderate MR. CPX (8/18) with severe functional impairment due to HF.  He is s/p HM3 LVAD placement. He had pump thrombosis 6/19 due to [facility] outflow graft kinking.  He had pump exchange for another HM3.  LVAD parameters stable but he does have PI events and low flow events at times with HD. He takes midodrine pre-HD, but needs hydralazine on non-HD days.  MAP high today but has not had hydralazine today.   - Continue midodrine 10 mg pre-HD - Volume managed at dialysis, MWF.  Would be careful to avoid excess UF given LVAD. See plan as above (ok to increase dry weight anohth 0.5-1.0 kg) - Continue ASA 81 and warfarin with INR goal 2-2.5.    - Continue hydralazine 25 mg tid on non-HD days. - He is not candidate for transplant now given extensive PAD with amputations.   3. ESRD: HD on M-W-F with midodrine pre-HD.  - Need to allow UF as long as MAP > 70 (would not stop for MAP 70-80).   - With concern for suction events. As above will tel Dialysis center to let weight drift up another 0.5-1KG to prevent suction as needed 4.  CAD: NSTEMI in 3/18.  LHC with 99% ulcerated lesion proximal RCA with left to right collaterals, 95% mid LAD stenosis after mid LAD stent.  s/p PCI to RCA and LAD on 11/25/16.  - Continue statin and ASA.  5. h/o DVTs: Factor V  Leiden heterozygote. - Anticoagulated. 6. Diabetic gastroparesis: He has seen gastroparesis specialist at [facility]. Surprisingly, gastric emptying study was normal. However, electrogastrogram showed poor gastric accomodation/capacity. Therefore, small frequent meals recommended.  He has been doing much better with this, minimal symptoms.  - Continue reglan 10 mg tid/ac, now off Marinol.  - Continue Protonix bid.  - Limit narcotic use as much as possible.  7. HTN: Takes hydralazine 25 tid on non-HD days. MAP high today but has not had hydralazine.  - Take hydralazine as soon as you get home.  - Continue midodrine 10 mg pre-HD as above.  8. Chronic pain: Now seen in a pain clinic.  9. Atrial flutter: Paroxysmal.  Not recent recurrence 10. PAD/ischemic feet:  He is now s/p bilateral AKAs.  Able to wheel his chair and do transfers without dyspnea.  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  - Will continue cefadroxil long-term (three days/week after HD).  This will be followed by ID.  - site ok today  Total time spent 50 minutes. Over half that time spent discussing above.    [name] MD 12/01/2022

## 2022-12-01 NOTE — Progress Notes (Signed)
Patient presents for sick visit with RAMP echo today with his daughter [name]. Denies issues with VAD equipment.   Denies anymore shocks from his ICD, lightheadedness, dizziness, falls, chest pain, and signs of bleeding. pt transferred himself from wheelchair to stretcher today. Reports good PO intake. PRN Oxy provided by pain clinic for back and groin pain- pain well managed. States he had a low flow this morning but did not have any Low flows yesterday at dialysis.Per Dr Haroldine Laws we increased dry weight by 0.5 kg during HD treatments on Monday. Could increase his dry wt to 78kg per DR Bensimhon if issues persists.  Pts speed was drop in clinic on Monday from 5600 to 5500. RAMP echo at bedside today with DR Bensimhon.  Pt with multiple VT/VF episodes on remote interrogation. ATP paced out of several occurences. Pt reports on Friday prior to ICD shock he was lightheaded and dizzy. Symptoms resolved after shock. Pt confirms that he is taking Amio 400 BID x 14 days, then resume 400 mg daily. Pt reminded he should stop mexiletine once he decreases his Amio to daily.   Pt confirms he is taking Cefadroxil three times a week. Drive line site improved since last visit. Pt has repeated trauma to driveline as he refuses to wear anchor.   Labs from dialysis 11/17/22 reviewed by Dr Haroldine Laws.   Pt had low flow this morning, but his BP is elevated in clinic today. Pt states he was nauseous this morning and unable to take his hydralazine.  Vital Signs:  Doppler Pressure: 162 Automatc BP: 155/124 (135) HR:85 paced SPO2: 100% on RA   Weight: UTO lb w/o eqt Last weight: 168.3 lb pt reported from dialysis center (dry wt: 77.7 kg)   VAD Indication: Destination therapy- Pt is no longer candidate for heart/kidney transplant due to PAD per [facility].    VAD interrogation & Equipment Management: Speed: 5400 Flow: 2.8 Power: 4.3 w    PI: 9.8 Hct: dropped to 20 today- do not titrate   Alarms: LOW FLOW @ 0530  this morning Events: 50 + on HD days  Fixed speed: 5400 Low speed limit: 5100   Primary Controller: Replace back up battery in 9 months Back up controller:   Replace back up battery in 11 months   Annual Equipment Maintenance on UBC/PM was performed on 02/09/22.    I reviewed the LVAD parameters from today and compared the results to the patient's prior recorded data. LVAD interrogation was NEGATIVE for significant power changes, NEGATIVE for clinical alarms and STABLE for PI events/speed drops. No programming changes were made and pump is functioning within specified parameters. Pt is performing daily controller and system monitor self tests along with completing weekly and monthly maintenance for LVAD equipment.   LVAD equipment check completed and is in good working order. Back-up equipment present.   Exit Site Care: Drive line is being maintained daily by daughter. He is not wearing an anchor- refuses to wear. Pt denies fever or chills. Continue every other day dressing changes. Pt has adequate dressing supplies at home.   Significant Events on VAD Support:  -10/2016>> poss drive line infection, CT ABD neg, ID consult-doxy -11/09/16>> admit for poss drive line infection, IV abx -03/24/17>> doxy for poss drive line infection -05/04/17>> drive line debridement with wound-vac -06/2017>> drive line +proteus, IV abx, Bactrim x14 days  Device: St jude Dual Therapies: on 200 bpm Last check: 11/25/22  BP & Labs: Doppler BP 0000000 -modified systolic. Has not taken medications  yet today.   Hgb 10.7 on HD labs 11/17/22- No S/S of bleeding. Specifically denies melena/BRBPR or nosebleeds.   LDH 238 on HD labs 11/17/22 - Denies tea-colored urine. No power elevations noted on interrogation.    Patient Instructions:  No medication changes today We decreased your speed to 5400 today Return for full visit with DR Caldwell in may  [place] Coordinator  Office: [phone]  24/7 Pager:  [phone]

## 2022-12-01 NOTE — Progress Notes (Signed)
Speed  Flow  PI  Power  LVIDD  AI  Aortic opening MR  TR  Septum  RV  VTI (>18cm)  5500  3 9.9 4.5 5.6 mild 0/5 none trace Pulling slightly toward LV mod     5400 2.8 9.8 4.3 5.6  0/5 none trace midline mod 10.2                                                           Doppler MAP: 162 Auto cuff BP: 155/124 (135)    Ramp ECHO performed at bedside per   At completion of ramp study, patients primary controller programmed:  Fixed speed: 5400 Low speed limit: 5100    [name] RN, VAD Coordinator 24/7 pager [phone]

## 2022-12-01 NOTE — Progress Notes (Signed)
LVAD Clinic Note  Cardiology: Dr. Aundra Dubin  HPI:   [name] is a 57 y.o. with history of CAD s/p anterior MI in 2010 and NSTEMI in 3/18 with DES to pRCA and mLAD and ischemic cardiomyopathy now s/p Heartmate 3 LVAD and ESRD presents for followup of CHF/LVAD.    Patient developed low output heart failure.  He was not deemed to be a good transplant candidate.  He was admitted in 10/18 and Heartmate 3 LVAD was placed.  He had baseline CKD stage 3 likely from diabetic nephropathy and CHF.  He developed peri-op AKI and ended up on dialysis.   Admitted 6/19 for emergent VAD exchange 03/01/18 in the setting of HM3 outflow graft kink. He required CVVHD post op, but was transtioned back to [facility] without problems.   Subsequently developed ischemic LEs and underwent sequential bilateral AKAs.   Patient had episode of VT in 2/23 with ICD shock.  He was started on amiodarone.  He had another ICD discharge with VF in 10/23 in the setting of significant emotional stress (grandson had died).   Patient presents for sick visit following ICD shock on 3/22 with his son. Denies issues with VAD equipment.   He is here for work-in visit with his son due to recent ICD shock and low flow alarms on VAD.   Recently found to have multiple VT/VF episodes on remote interrogation. ATP paced out of several occurences. Contacted by EP and amio restarted at 400 BID (had been off)  Pt reports on Friday prior to ICD shock he was lightheaded and dizzy. Symptoms resolved after shock. Pt confirms that he is taking Amio 400 BID x 10 days, then switch to 400 mg daily. Also on Mexiletine 150 mg BID with a plan to stop when amio lowered back down per his report.   Pt confirms he is taking Cefadroxil three times a week. Drive line site improved since last visit. Pt has repeated trauma to driveline as he refuses to wear anchor. Dressing changed in clinic today. See documentation below.    Patient seen earlier this week with multiple  episodes of VT/VF om his ICD and low flow alarms on VAD. Amio increased. VAD speed turned down 5600-> 5500 and told to increase his dry weight by 1-2 pounds at HD. He returns to day for f/u and for ramp echo. Says he feels good. Weight increased 1-2 pounds at HD. No ICD shocks. No edema, orthopnea or PND.   Ramp echo: LVAD well positioned in LV. RV moderate to severe HK. RVOT VTI 10.2 Septum pulling in on 5500. AoV not opening -> speed decreased to 5400    VAD Indication: Destination therapy- Pt is no longer candidate for heart/kidney transplant due to PAD per [facility].    VAD interrogation & Equipment Management: Speed: 5400 Flow: 2.8 Power: 4.3 w    PI: 9.8 Hct: dropped to 20 today- do not titrate   Alarms: LOW FLOW @ 0530 this morning Events: 50 + on HD days  Fixed speed: 5400 Low speed limit: 5100   Primary Controller: Replace back up battery in 9 months Back up controller:   Replace back up battery in 11 months   Annual Equipment Maintenance on UBC/PM was performed on 02/09/22.   PMH: 1. Chronic systolic CHF: Ischemic cardiomyopathy.  St Jude ICD.  - Echo (11/17) with EF 25-30%, moderate diastolic dysfunction, mild MR, mildly dilated RV with moderately decreased systolic function, peak RV-RA gradient 48 mmHg.  - Echo (3/18) with  EF 25-30%, wall motion abnormalities, normal RV size and systolic function.  - Admission 3/18 with cardiogenic shock and AKI, required milrinone gtt.  - Echo (8/18) with EF 20-25%, mild LVH, moderate LV dilation, mild MR.  - CPX (8/18) with peak VO2 12.4, VE/VCO2 slope 35, RER 1.05 => severe HR limitation.  - Heartmate 3 LVAD 10/18.  - Pump thrombosis in setting of HM3 outflow graft kink.  Pump exchange 6/19 for HM3.  2. CAD: Anterior MI 2005, had bifurcation stenting LAD/diagonal.  Repeat cath 2010 with patent stents.  - NSTEMI 3/18: LHC with 99% ulcerated lesion proximal RCA, 95% mid LAD => PCI with DES to proximal RCA and mid LAD.  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  - Lower extremity venous dopplers (8/18): chronic DVT - V/Q scan (8/18): Normal, no PE.  4. Crohns disease: History of colectomy.  5. GERD 6. Diabetic gastroparesis: Admission 11/18 with abdominal pain, nausea/vomiting.  - [facility] workup 2019: Gastric emptying study near normal.  Negative breath test (no evidence for small bowel bacterial overgrowth).  Electrogastrogram showed poor gastric accomodation/capacity, "bradygastria."  7. HTN 8. Hyperlipidemia 9. Type II diabetes  10. ESRD: Post-op LVAD surgery.  He has had failed AV fistula and AV graft, now dialyzing via catheter . 11. Carotid dopplers (8/18): mild bilateral stenosis.  12. PFTs (8/18): Mild restriction (no IDL on CT chest).  13. Lung nodules: CT chest 8/18 with small bilateral nodules.  14. PAD: ABIs with moderate bilateral disease in 8/18.  - Peripheral angiogram 03/18/19 showed 80% right SFA stenosis and occluded right PT.  He had stenting of SFA and PTCA of PT with restoration of flow.  However, he still needed amputation right 4th toe on 03/21/19 with intractable pain.  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  - Peripheral angiogram left leg: DES to left SFA, only 1 vessel runoff via severely diseased peroneal artery, unable to intervene. 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  - Left AKA 11/20.  15. Atrial flutter: In 11/18 associated with nausea/vomiting from gastroparesis.  16. VT: VT with ICD shock in 11/22.  - VT with ICD shock in 2/23, amiodarone started.  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 Please take 1 tablet by mouth twice daily for 14 days then resume a dose of 1 tablet daily 90 tablet 3   aspirin EC 81 MG tablet Take 1 tablet (81 mg total) by mouth daily. 90 tablet 3   atorvastatin (LIPITOR) 40 MG tablet Take 1 tablet (40 mg total) by mouth daily at 6 PM. 90 tablet  3   benzonatate (TESSALON) 100 MG capsule Take 2 capsules (200 mg total) by mouth 3 (three) times daily as needed for cough. 30 capsule 2   calcitRIOL (ROCALTROL) 0.5 MCG capsule Take 1 capsule (0.5 mcg total) by mouth every Monday, Wednesday, and Friday. 15 capsule 5   cefadroxil (DURICEF) 500 MG capsule Take 2 capsules (1,000 mg total) by mouth 3 (three) times a week. Take AFTER dialysis sessions 24 capsule 11   citalopram (CELEXA) 20 MG tablet Take 1 tablet (20 mg total) by mouth daily. 30 tablet 6   COLACE 100 MG capsule Take 100 mg by mouth daily as needed for moderate constipation.     fluticasone (FLONASE) 50 MCG/ACT nasal spray Place 1 spray into both nostrils daily. 16 g 11   hydrALAZINE (  APRESOLINE) 25 MG tablet Take 1 tablet three times a day on NON-dialysis days only 270 tablet 3   insulin glargine (LANTUS) 100 UNIT/ML injection Inject 0.35 mLs (35 Units total) into the skin every morning. 3 mL 11   levocetirizine (XYZAL) 5 MG tablet Take 1 tablet (5 mg total) by mouth every evening. 30 tablet 5   LORazepam (ATIVAN) 0.5 MG tablet Take 1 tablet (0.5 mg total) by mouth every 12 (twelve) hours as needed (nausea). (Patient not taking: Reported on 11/28/2022) 60 tablet 0   metoCLOPramide (REGLAN) 10 MG tablet Take 1 tablet (10 mg total) by mouth 3 (three) times daily. (Patient taking differently: Take 10 mg by mouth 3 (three) times daily before meals.) 90 tablet 6   mexiletine (MEXITIL) 150 MG capsule Take 1 capsule (150 mg total) by mouth 2 (two) times daily. Please stop once you decrease Amio to 1 tablet daily 60 capsule 6   midodrine (PROAMATINE) 5 MG tablet Take 2 tablets (10 mg total) by mouth every Monday, Wednesday, and Friday. Take 1 tablet before dialysis treatment 70 tablet 6   multivitamin (RENA-VIT) TABS tablet Take 1 tablet by mouth daily.     nitroGLYCERIN (NITROSTAT) 0.4 MG SL tablet Place 1 tablet (0.4 mg total) under the tongue every 5 (five) minutes as needed for chest pain. 90  tablet 3   ondansetron (ZOFRAN-ODT) 4 MG disintegrating tablet Take by mouth as needed. (Patient not taking: Reported on 11/28/2022)     oxyCODONE-acetaminophen (PERCOCET) 7.5-325 MG tablet Take 1 tablet by mouth 3 (three) times daily as needed for pain.     pantoprazole (PROTONIX) 40 MG tablet Take 1 tablet (40 mg total) by mouth 2 (two) times daily before lunch and supper. 60 tablet 11   promethazine (PHENERGAN) 12.5 MG tablet TAKE 1 TABLET (12.5 MG TOTAL) BY MOUTH EVERY 6 (SIX) HOURS AS NEEDED FOR NAUSEA OR VOMITING. (Patient not taking: Reported on 11/28/2022) 30 tablet 3   sevelamer carbonate (RENVELA) 0.8 g PACK packet Take 0.8 g by mouth 3 (three) times daily with meals. 270 each 1   triamcinolone ointment (KENALOG) 0.1 % Apply 1 Application topically 2 (two) times daily. 30 g 0   warfarin (COUMADIN) 5 MG tablet TAKE 1 TABLET BY MOUTH EVERY DAY 30 tablet 11   No current facility-administered medications for this encounter.    Metformin and related   Review of systems complete and found to be negative unless listed in HPI.     Vital Signs:  Doppler Pressure: 162 Automatc BP: 155/124 (135) HR:85 paced SPO2: 100% on RA   Weight: UTO lb w/o eqt Last weight: 168.3 lb pt reported from dialysis center (dry wt: 77.7 kg)   Exam General:  NAD.  HEENT: normal  Neck: supple. JVP not elevated.  Carotids 2+ bilat; no bruits. No lymphadenopathy or thryomegaly appreciated. Cor: LVAD hum.  Lungs: Clear. Abdomen: obese soft, nontender, non-distended. No hepatosplenomegaly. No bruits or masses. Good bowel sounds. Driveline site clean. Anchor in place.  Extremities: no cyanosis, clubbing, rash. Warm no edema  s/p B AKA Neuro: alert & oriented x 3. No focal deficits.   ASSESSMENT AND PLAN: 1. Frequent VT with ICD shock - He was started on amiodarone after 2/23 ICD shock. VF again with shock in 10/23 in setting of extreme stress - EP managing now on amio 400 bid x 10 dayd + mexilitene 150 bid.  Plan to stop mexilitene once amio weaned down to 400 bid - multiple low flows  on VAD with PI events. ? Suction events triggering - Ramp echo today (12/01/22): LVAD well positioned in LV. RV moderate to severe HK. RVOT VTI 10.2 Septum pulling in on 5500. AoV not opening -> speed decreased to 5400 - Labs from HD ok - ICD interrogated personally in clinic. VT improved with increased dose amio - Dry weight increased 1-2 pound at HD. Can increase a bit more as needed 2. Chronic systolic CHF: Ischemic cardiomyopathy.  St Jude ICD.  NSTEMI in March 2018 with DES to LAD and RCA, complicated by cardiogenic shock, low output requiring milrinone.  Echo 8/18 with EF 20-25%, moderate MR. CPX (8/18) with severe functional impairment due to HF.  He is s/p HM3 LVAD placement. He had pump thrombosis 6/19 due to [facility] outflow graft kinking.  He had pump exchange for another HM3.  LVAD parameters stable but he does have PI events and low flow events at times with HD. He takes midodrine pre-HD, but needs hydralazine on non-HD days.  MAP high today but has not had hydralazine today.   - Continue midodrine 10 mg pre-HD - Volume managed at dialysis, MWF.  Would be careful to avoid excess UF given LVAD. See plan as above (ok to increase dry weight anohth 0.5-1.0 kg) - Continue ASA 81 and warfarin with INR goal 2-2.5.    - Continue hydralazine 25 mg tid on non-HD days. - He is not candidate for transplant now given extensive PAD with amputations.   3. ESRD: HD on M-W-F with midodrine pre-HD.  - Need to allow UF as long as MAP > 70 (would not stop for MAP 70-80).   - With concern for suction events. As above will tel Dialysis center to let weight drift up another 0.5-1KG to prevent suction as needed 4.  CAD: NSTEMI in 3/18.  LHC with 99% ulcerated lesion proximal RCA with left to right collaterals, 95% mid LAD stenosis after mid LAD stent.  s/p PCI to RCA and LAD on 11/25/16.  - Continue statin and ASA.  5. h/o DVTs: Factor V  Leiden heterozygote. - Anticoagulated. 6. Diabetic gastroparesis: He has seen gastroparesis specialist at [facility]. Surprisingly, gastric emptying study was normal. However, electrogastrogram showed poor gastric accomodation/capacity. Therefore, small frequent meals recommended.  He has been doing much better with this, minimal symptoms.  - Continue reglan 10 mg tid/ac, now off Marinol.  - Continue Protonix bid.  - Limit narcotic use as much as possible.  7. HTN: Takes hydralazine 25 tid on non-HD days. MAP high today but has not had hydralazine.  - Take hydralazine as soon as you get home.  - Continue midodrine 10 mg pre-HD as above.  8. Chronic pain: Now seen in a pain clinic.  9. Atrial flutter: Paroxysmal.  Not recent recurrence 10. PAD/ischemic feet:  He is now s/p bilateral AKAs.  Able to wheel his chair and do transfers without dyspnea.  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  - Will continue cefadroxil long-term (three days/week after HD).  This will be followed by ID.  - site ok today  Total time spent 50 minutes. Over half that time spent discussing above.    [name] MD 12/01/2022

## 2022-12-02 ENCOUNTER — Ambulatory Visit (HOSPITAL_COMMUNITY)
Admission: RE | Admit: 2022-12-02 | Discharge: 2022-12-02 | Disposition: A | Discharge status: Home / Self Care | Payer: Medicare Other | Source: Ambulatory Visit | Attending: Student | Admitting: Student

## 2022-12-02 DIAGNOSIS — R059 Cough, unspecified: Secondary | ICD-10-CM | POA: Insufficient documentation

## 2022-12-02 DIAGNOSIS — R0600 Dyspnea, unspecified: Secondary | ICD-10-CM | POA: Insufficient documentation

## 2022-12-02 DIAGNOSIS — R053 Chronic cough: Secondary | ICD-10-CM | POA: Insufficient documentation

## 2022-12-02 LAB — BLOOD GAS, ARTERIAL
Acid-Base Excess: 11.6 mmol/L — ABNORMAL HIGH (ref 0.0–2.0)
Bicarbonate: 35 mmol/L — ABNORMAL HIGH (ref 20.0–28.0)
O2 Saturation: 89 %
Patient temperature: 37
pCO2 arterial: 40 mmHg (ref 32–48)
pH, Arterial: 7.55 — ABNORMAL HIGH (ref 7.35–7.45)
pO2, Arterial: 53 mmHg — ABNORMAL LOW (ref 83–108)

## 2022-12-03 LAB — PULMONARY FUNCTION TEST
DL/VA % pred: 86 %
DL/VA: 3.75 ml/min/mmHg/L
DLCO unc % pred: 27 %
DLCO unc: 7.08 ml/min/mmHg

## 2022-12-05 ENCOUNTER — Telehealth (HOSPITAL_COMMUNITY): Payer: Self-pay | Admitting: *Deleted

## 2022-12-05 ENCOUNTER — Other Ambulatory Visit (HOSPITAL_COMMUNITY): Payer: Self-pay

## 2022-12-05 ENCOUNTER — Ambulatory Visit: Payer: Medicare Other | Attending: Internal Medicine | Admitting: Internal Medicine

## 2022-12-05 ENCOUNTER — Telehealth: Payer: Self-pay

## 2022-12-05 ENCOUNTER — Encounter: Payer: Self-pay | Admitting: Internal Medicine

## 2022-12-05 VITALS — HR 90 | Ht 68.0 in

## 2022-12-05 DIAGNOSIS — I5022 Chronic systolic (congestive) heart failure: Secondary | ICD-10-CM | POA: Diagnosis not present

## 2022-12-05 DIAGNOSIS — Z95810 Presence of automatic (implantable) cardiac defibrillator: Secondary | ICD-10-CM

## 2022-12-05 DIAGNOSIS — I255 Ischemic cardiomyopathy: Secondary | ICD-10-CM | POA: Diagnosis not present

## 2022-12-05 DIAGNOSIS — I4892 Unspecified atrial flutter: Secondary | ICD-10-CM

## 2022-12-05 DIAGNOSIS — Z95811 Presence of heart assist device: Secondary | ICD-10-CM

## 2022-12-05 DIAGNOSIS — I472 Ventricular tachycardia, unspecified: Secondary | ICD-10-CM

## 2022-12-05 LAB — CUP PACEART INCLINIC DEVICE CHECK
Date Time Interrogation Session: 20240401172813
Implantable Lead Connection Status: 753985
Implantable Lead Connection Status: 753985
Implantable Lead Implant Date: 20100430
Implantable Lead Implant Date: 20100430
Implantable Lead Location: 753859
Implantable Lead Location: 753860
Implantable Lead Model: 7121
Implantable Pulse Generator Implant Date: 20180110
Pulse Gen Serial Number: 7383809

## 2022-12-05 MED ORDER — MEXILETINE HCL 150 MG PO CAPS: 150.0000 mg | ORAL_CAPSULE | Freq: Two times a day (BID) | ORAL | 6 refills | Status: DC

## 2022-12-05 NOTE — Telephone Encounter (Signed)
Following alert received from [employer] received for Device alert for sustained VT with ATP therapy. 4 VT-1 events occurred   3/30 between 17:59 - 20:37, EGM shows sustained VT, rates 144-155, ATP delivered x1 converting the first event, other three events slow the VT rate below the detection criteria without converting arrhythmia. Presenting AS/VP. Therapy zones adjusted 3/13, Amiodarone has been increased per EPIC.  Routing to VAD Clinic considering recent OV and aware of recent ATP.

## 2022-12-05 NOTE — Telephone Encounter (Signed)
Continue amiodarone load and continue mexiletine (do not stop for now).  Hard to tell from that note, is he currently out of VT and in NSR?

## 2022-12-05 NOTE — Progress Notes (Signed)
Patient Care Team: Precious Gilding, DO as PCP - General (Family Medicine) Larey Dresser, MD as PCP - Advanced Heart Failure (Cardiology) Deboraha Sprang, MD as PCP - Electrophysiology (Cardiology) Center, [facility] Louann Liv, [place] as Social Worker (Licensed Clinical Social Worker)   HPI  [name] is a 57 y.o. male Seen In followup for an [place] implanted 2010 for primary prevention and generator replacement 1/18   THx  prior MI, prior stenting and ischemic cardiomyopathy with an ejection fraction of 15-20% and patent stents .    He then was admitted 3/18 with acute kidney failure cardiac failure requiring inotropic support. He underwent a 25 pound diuresis. Elevated troponins prompted catheterization and he had an RCA and LAD stent placed  Echo EF was 20-25%  Subsequently has had a VAT implanted.  Has had had multiple episodes of ventricular tachycardia in recent months prompting the addition of mexiletine.  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  The plan was to discontinue the latter.  He asks how he is doing today, "be truthful with me doc "    Past Medical History:  Diagnosis Date   Angina    ASCVD (arteriosclerotic cardiovascular disease)    , Anterior infarction 2005, LAD diagonal bifurcation intervention 03/2004   Automatic implantable cardiac defibrillator -[employer]        Benign neoplasm of colon    CHF (congestive heart failure)    Chronic systolic heart failure    Coronary artery disease     Widely patent previously placed stents in the left anterior    Crohn's disease    Deep venous thrombosis    Recurrent-on Coumadin   Dialysis patient    Dyspnea    Gastroparesis    GERD (gastroesophageal reflux disease)    High cholesterol     Hyperlipidemia    Hypersomnolent    Previous diagnosis of narcolepsy   Hypertension, essential    Ischemic cardiomyopathy    Ejection fraction 15-20% catheterization 2010   Type II or unspecified type diabetes mellitus without mention of complication, not stated as uncontrolled    Unspecified gastritis and gastroduodenitis without mention of hemorrhage     Past Surgical History:  Procedure Laterality Date   A/V SHUNTOGRAM N/A 05/24/2018   Procedure: A/V SHUNTOGRAM;  Surgeon: Marty Heck, MD;  Location: [place] [facility];  Service: Cardiovascular;  Laterality: N/A;   ABDOMINAL AORTOGRAM W/LOWER EXTREMITY N/A 03/18/2019   Procedure: ABDOMINAL AORTOGRAM W/LOWER EXTREMITY;  Surgeon: Waynetta Sandy, MD;  Location: [place] [facility];  Service: Cardiovascular;  Laterality: N/A;   AMPUTATION Right 03/21/2019   Procedure: AMPUTATION DIGIT RIGHT FOURTH TOE;  Surgeon: Waynetta Sandy, MD;  Location: [place];  Service: Vascular;  Laterality: Right;   AMPUTATION Left 05/29/2019   Procedure: AMPUTATION LEFT GREAT TOE;  Surgeon: Serafina Mitchell, MD;  Location: [place];  Service: Vascular;  Laterality: Left;   AMPUTATION Right 06/02/2019   Procedure: ABOVE KNEE AMPUTATION;  Surgeon: Rosetta Posner, MD;  Location: [place];  Service: Vascular;  Laterality: Right;   AMPUTATION Left 07/23/2019   Procedure: AMPUTATION ABOVE KNEE LEFT;  Surgeon: Elam Dutch, MD;  [facility] OR;  Service: Vascular;  Laterality: Left;   APPENDECTOMY     AV FISTULA  PLACEMENT Right 06/26/2017   Procedure: ARTERIOVENOUS (AV) FISTULA CREATION VERSUS GRAFT RIGHT  ARM;  Surgeon: Conrad Groom, MD;  Location: [place];  Service: Vascular;  Laterality: Right;   AV FISTULA PLACEMENT Right 10/31/2017   Procedure: INSERTION OF ARTERIOVENOUS (AV) ARTEGRAFT,  RIGHT UPPER ARM;  Surgeon: Conrad Hartly, MD;  Location: [place];  Service: Vascular;  Laterality: Right;   AV FISTULA PLACEMENT Left 05/29/2018   Procedure:  ARTERIOVENOUS (AV) FISTULA CREATION  LEFT UPPER EXTREMITY;  Surgeon: Waynetta Sandy, MD;  Location: [place];  Service: Vascular;  Laterality: Left;   AV FISTULA PLACEMENT Left 08/09/2018   Procedure: INSERTION OF ARTERIOVENOUS (AV) GORE-TEX GRAFT LEFT UPPER ARM;  Surgeon: Waynetta Sandy, MD;  Location: [place];  Service: Vascular;  Laterality: Left;   CARDIAC DEFIBRILLATOR PLACEMENT  2010   [employer]. Jude ICD   CENTRAL LINE INSERTION Right 05/24/2018   Procedure: CENTRAL LINE INSERTION;  Surgeon: Marty Heck, MD;  Location: [place] CV LAB;  Service: Cardiovascular;  Laterality: Right;   COLECTOMY  ~ 2003   "for Crohn's" ascending colon.     CORONARY STENT INTERVENTION N/A 11/25/2016   Procedure: Coronary Stent Intervention;  Surgeon: Leonie Man, MD;  Location: [place] CV LAB;  Service: Cardiovascular;  Laterality: N/A;   EP IMPLANTABLE DEVICE N/A 09/14/2016   Procedure: ICD Generator Changeout;  Surgeon: Deboraha Sprang, MD;  Location: [place] CV LAB;  Service: Cardiovascular;  Laterality: N/A;   ESOPHAGOGASTRODUODENOSCOPY N/A 07/12/2017   Procedure: ESOPHAGOGASTRODUODENOSCOPY (EGD);  Surgeon: Jerene Bears, MD;  Location: [facility] ENDOSCOPY;  Service: Gastroenterology;  Laterality: N/A;  VAD pt so VAD team will need to accompany pt.    FETAL SURGERY FOR CONGENITAL HERNIA     ????? pt has no knowledge of this.[name]     FISTULOGRAM Left 08/09/2018   Procedure: FISTULOGRAM LEFT ARM;  Surgeon: Waynetta Sandy, MD;  Location: [place];  Service: Vascular;  Laterality: Left;   INGUINAL HERNIA REPAIR     INSERTION OF DIALYSIS CATHETER Right 06/26/2017   Procedure: INSERTION OF TUNNELED  DIALYSIS CATHETER RIGHT INTERNAL JUGULAR;  Surgeon: Conrad Walworth, MD;  Location: [place];  Service: Vascular;  Laterality: Right;   INSERTION OF IMPLANTABLE LEFT VENTRICULAR ASSIST DEVICE N/A 06/12/2017   Procedure: INSERTION OF IMPLANTABLE LEFT VENTRICULAR ASSIST [place] 3;   Surgeon: Ivin Poot, MD;  Location: [place];  Service: Open Heart Surgery;  Laterality: N/A;  HM3 LVAD  CIRC ARREST  NITRIC OXIDE   INSERTION OF IMPLANTABLE LEFT VENTRICULAR ASSIST DEVICE N/A 02/28/2018   Procedure: REDO INSERTION OF IMPLANTABLE LEFT VENTRICULAR ASSIST DEVICE - HEARTMATE 3;  Surgeon: Ivin Poot, MD;  Location: [place];  Service: Open Heart Surgery;  Laterality: N/A;   IR FLUORO GUIDE CV LINE RIGHT  07/22/2019   IR REMOVAL TUN CV CATH W/O FL  02/26/2018   IR THORACENTESIS ASP PLEURAL SPACE W/IMG GUIDE  06/28/2017   IR US GUIDE VASC ACCESS RIGHT  07/22/2019   LIGATION OF ARTERIOVENOUS  FISTULA Right 10/31/2017   Procedure: LIGATION OF BRACHIO-BASILIC VEIN TRANSPOSITION RIGHT ARM;  Surgeon: Conrad Gilbert, MD;  Location: [place];  Service: Vascular;  Laterality: Right;   LOWER EXTREMITY ANGIOGRAPHY  05/02/2019   Procedure: Lower Extremity Angiography;  Surgeon: Marty Heck, MD;  Location: [place] CV LAB;  Service: Cardiovascular;;   MULTIPLE EXTRACTIONS WITH ALVEOLOPLASTY N/A 06/08/2017   Procedure: MULTIPLE EXTRACTION WITH ALVEOLOPLASTY AND PRE PROSTHETIC SURGERY  AS NEEDED;  Surgeon: Lenn Cal, DDS;  Location: [place];  Service: Oral Surgery;  Laterality: N/A;   PERIPHERAL VASCULAR BALLOON ANGIOPLASTY  03/18/2019   Procedure: PERIPHERAL VASCULAR BALLOON ANGIOPLASTY;  Surgeon: Waynetta Sandy, MD;  Location: [place] CV LAB;  Service: Cardiovascular;;  RT PT   PERIPHERAL VASCULAR INTERVENTION  03/18/2019   Procedure: PERIPHERAL VASCULAR INTERVENTION;  Surgeon: Waynetta Sandy, MD;  Location: [place] [facility];  Service: Cardiovascular;;  RT SFA   PERIPHERAL VASCULAR INTERVENTION  05/02/2019   Procedure: PERIPHERAL VASCULAR INTERVENTION;  Surgeon: Marty Heck, MD;  Location: [place] [facility] LAB;  Service: Cardiovascular;;  Lt. SFA   RIGHT HEART CATH N/A 06/07/2017   Procedure: RIGHT HEART CATH;  Surgeon: Larey Dresser, MD;   Location: [place] [facility];  Service: Cardiovascular;  Laterality: N/A;   RIGHT HEART CATH N/A 02/08/2018   Procedure: RIGHT HEART CATH;  Surgeon: Larey Dresser, MD;  Location: [place] [facility];  Service: Cardiovascular;  Laterality: N/A;   RIGHT HEART CATH N/A 08/09/2018   Procedure: RIGHT HEART CATH;  Surgeon: Larey Dresser, MD;  Location: [place] [facility];  Service: Cardiovascular;  Laterality: N/A;   RIGHT/LEFT HEART CATH AND CORONARY ANGIOGRAPHY N/A 11/23/2016   Procedure: Right/Left Heart Cath and Coronary Angiography;  Surgeon: Larey Dresser, MD;  Location: [place] [facility];  Service: Cardiovascular;  Laterality: N/A;   TEE WITHOUT CARDIOVERSION N/A 06/12/2017   Procedure: TRANSESOPHAGEAL ECHOCARDIOGRAM (TEE);  Surgeon: Prescott Gum, Collier Salina, MD;  Location: [place];  Service: Open Heart Surgery;  Laterality: N/A;   TEE WITHOUT CARDIOVERSION N/A 02/28/2018   Procedure: TRANSESOPHAGEAL ECHOCARDIOGRAM (TEE);  Surgeon: Prescott Gum, Collier Salina, MD;  Location: [place];  Service: Open Heart Surgery;  Laterality: N/A;   TEMPORARY DIALYSIS CATHETER Left 05/24/2018   Procedure: TEMPORARY DIALYSIS CATHETER;  Surgeon: Marty Heck, MD;  Location: [place] CV [facility];  Service: Cardiovascular;  Laterality: Left;   THROMBECTOMY AND REVISION OF ARTERIOVENTOUS (AV) GORETEX  GRAFT Right 12/12/2017   Procedure: THROMBECTOMY ARTERIOVENTOUS (AV) GORETEX  GRAFT RIGHT UPPER ARM;  Surgeon: Conrad Bison, MD;  Location: [place];  Service: Vascular;  Laterality: Right;   TRANSMETATARSAL AMPUTATION Right 04/30/2019   Procedure: RIGHT TRANSMETATARSAL AMPUTATION;  Surgeon: Waynetta Sandy, MD;  Location: [place];  Service: Vascular;  Laterality: Right;    Current Outpatient Medications  Medication Sig Dispense Refill   amiodarone (PACERONE) 400 MG tablet Take 1 tablet (400 mg total) by mouth daily. Please take 1 tablet by mouth twice daily for 14 days then resume a dose of 1 tablet daily 90 tablet 3   aspirin EC  81 MG tablet Take 1 tablet (81 mg total) by mouth daily. 90 tablet 3   atorvastatin (LIPITOR) 40 MG tablet Take 1 tablet (40 mg total) by mouth daily at 6 PM. 90 tablet 3   benzonatate (TESSALON) 100 MG capsule Take 2 capsules (200 mg total) by mouth 3 (three) times daily as needed for cough. 30 capsule 2   calcitRIOL (ROCALTROL) 0.5 MCG capsule Take 1 capsule (0.5 mcg total) by mouth every Monday, Wednesday, and Friday. 15 capsule 5   cefadroxil (DURICEF) 500 MG capsule Take 2 capsules (1,000 mg total) by mouth 3 (three) times a week. Take AFTER dialysis sessions 24 capsule 11   citalopram (CELEXA) 20 MG tablet Take 1 tablet (20 mg total) by mouth daily. 30 tablet 6   COLACE 100 MG capsule Take  100 mg by mouth daily as needed for moderate constipation.     fluticasone (FLONASE) 50 MCG/ACT nasal spray Place 1 spray into both nostrils daily. 16 g 11   hydrALAZINE (APRESOLINE) 25 MG tablet Take 1 tablet three times a day on NON-dialysis days only 270 tablet 3   insulin glargine (LANTUS) 100 UNIT/ML injection Inject 0.35 mLs (35 Units total) into the skin every morning. 3 mL 11   levocetirizine (XYZAL) 5 MG tablet Take 1 tablet (5 mg total) by mouth every evening. 30 tablet 5   LORazepam (ATIVAN) 0.5 MG tablet Take 1 tablet (0.5 mg total) by mouth every 12 (twelve) hours as needed (nausea). 60 tablet 0   metoCLOPramide (REGLAN) 10 MG tablet Take 1 tablet (10 mg total) by mouth 3 (three) times daily. (Patient taking differently: Take 10 mg by mouth 3 (three) times daily before meals.) 90 tablet 6   midodrine (PROAMATINE) 5 MG tablet Take 2 tablets (10 mg total) by mouth every Monday, Wednesday, and Friday. Take 1 tablet before dialysis treatment 70 tablet 6   multivitamin (RENA-VIT) TABS tablet Take 1 tablet by mouth daily.     ondansetron (ZOFRAN-ODT) 4 MG disintegrating tablet Take by mouth as needed.     oxyCODONE-acetaminophen (PERCOCET) 7.5-325 MG tablet Take 1 tablet by mouth 3 (three) times daily  as needed for pain.     pantoprazole (PROTONIX) 40 MG tablet Take 1 tablet (40 mg total) by mouth 2 (two) times daily before lunch and supper. 60 tablet 11   promethazine (PHENERGAN) 12.5 MG tablet TAKE 1 TABLET (12.5 MG TOTAL) BY MOUTH EVERY 6 (SIX) HOURS AS NEEDED FOR NAUSEA OR VOMITING. 30 tablet 3   sevelamer carbonate (RENVELA) 0.8 g PACK packet Take 0.8 g by mouth 3 (three) times daily with meals. 270 each 1   triamcinolone ointment (KENALOG) 0.1 % Apply 1 Application topically 2 (two) times daily. 30 g 0   warfarin (COUMADIN) 5 MG tablet TAKE 1 TABLET BY MOUTH EVERY DAY 30 tablet 11   mexiletine (MEXITIL) 150 MG capsule Take 1 capsule (150 mg total) by mouth 2 (two) times daily. 60 capsule 6   nitroGLYCERIN (NITROSTAT) 0.4 MG SL tablet Place 1 tablet (0.4 mg total) under the tongue every 5 (five) minutes as needed for chest pain. 90 tablet 3   No current facility-administered medications for this visit.    Allergies  Allergen Reactions   Metformin And Related Diarrhea    Review of Systems negative except from HPI and PMH  Physical Exam.pped Pulse 90   Ht 5\' 8"  (1.727 m)   SpO2 98%   BMI 25.14 kg/m  Well developed and well nourished in no acute distress HENT normal Neck supple   Clear Device pocket well healed; without hematoma or erythema.  There is no tethering  Regular rate and rhythm LVAD hum Abd-soft with active BS No Clubbing cyanosis B BKA   Skin-warm and dry A & Oriented  Grossly normal sensory and motor function     Device function is normal. 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  Will continue the amiodarone at 400 twice daily and then drop it down to 400 mg which will happen I think he said in 2 days.  Will continue the mexiletine, when he sees Dr. DM in May, if there is no interval VT we will discontinue the mexiletine.  I will see him in 12 months.  He will need amiodarone surveillance labs when he sees Dr. Trey Paula in 6 months  Discussion regarding the implications of electrical mechanical interactions likely underlying his f electrical deterioration.  He realizes that he has been living with the end in mind for some time and more clearly at this point

## 2022-12-05 NOTE — Patient Instructions (Signed)
Medication Instructions:  Your physician recommends that you continue on your current medications as directed. Please refer to the Current Medication list given to you today.  *If you need a refill on your cardiac medications before your next appointment, please call your pharmacy*   Lab Work: None ordered.  If you have labs (blood work) drawn today and your tests are completely normal, you will receive your results only by: MyChart Message (if you have MyChart) OR A paper copy in the mail If you have any lab test that is abnormal or we need to change your treatment, we will call you to review the results.   Testing/Procedures: None ordered.    Follow-Up: At [facility], you and your health needs are our priority.  As part of our continuing mission to provide you with exceptional heart care, we have created designated Provider Care Teams.  These Care Teams include your primary Cardiologist (physician) and Advanced Practice Providers (APPs -  Physician Assistants and Nurse Practitioners) who all work together to provide you with the care you need, when you need it.  We recommend signing up for the patient portal called "MyChart".  Sign up information is provided on this After Visit Summary.  MyChart is used to connect with patients for Virtual Visits (Telemedicine).  Patients are able to view lab/test results, encounter notes, upcoming appointments, etc.  Non-urgent messages can be sent to your provider as well.   To learn more about what you can do with MyChart, go to [email or web address].    Your next appointment:   12 months with Dr Klein 

## 2022-12-05 NOTE — Telephone Encounter (Signed)
Following alert received from [employer] received for Device alert for sustained VT with ATP therapy. 4 VT-1 events occurred   3/30 between 17:59 - 20:37, EGM shows sustained VT, rates 144-155, ATP delivered x1 converting the first event, other three events slow the VT rate below the detection criteria without converting arrhythmia. Presenting AS/VP. Therapy zones adjusted 3/13, Amiodarone has been increased per EPIC.   Routing to [facility] Clinic considering recent OV and aware of recent ATP. (Above note received from EP clinic today).    Per Dr Aundra Dubin:  Continue amiodarone load and continue mexiletine (do not stop for now).     Spoke with pt. Pt states he was in his usual state of health on Saturday 3/30. States he just finished an appt with Dr Caryl Comes this afternoon. Per pt decision was made at that appt to continue Mexiletine for another month. Discussed need to continue medication until stopped by either Dr Aundra Dubin or Dr Caryl Comes. He verbalized understanding. Prescription updated and sent to pt's pharmacy.   [name] RN [place] Coordinator  Office: [phone]  24/7 Pager: [phone]

## 2022-12-06 ENCOUNTER — Other Ambulatory Visit (HOSPITAL_COMMUNITY): Payer: Self-pay | Admitting: Cardiology

## 2022-12-06 ENCOUNTER — Telehealth (HOSPITAL_COMMUNITY): Payer: Self-pay | Admitting: *Deleted

## 2022-12-06 ENCOUNTER — Ambulatory Visit (HOSPITAL_COMMUNITY): Payer: Self-pay | Admitting: Pharmacist

## 2022-12-06 ENCOUNTER — Telehealth: Payer: Self-pay

## 2022-12-06 ENCOUNTER — Ambulatory Visit (INDEPENDENT_AMBULATORY_CARE_PROVIDER_SITE_OTHER): Payer: Medicare Other

## 2022-12-06 ENCOUNTER — Other Ambulatory Visit (HOSPITAL_COMMUNITY): Payer: Self-pay | Admitting: *Deleted

## 2022-12-06 VITALS — Ht 68.0 in | Wt 170.9 lb

## 2022-12-06 DIAGNOSIS — Z0000 Encounter for general adult medical examination without abnormal findings: Secondary | ICD-10-CM

## 2022-12-06 DIAGNOSIS — I472 Ventricular tachycardia, unspecified: Secondary | ICD-10-CM

## 2022-12-06 DIAGNOSIS — Z95811 Presence of heart assist device: Secondary | ICD-10-CM

## 2022-12-06 LAB — POCT INR: INR: 2.8 (ref 2.0–3.0)

## 2022-12-06 MED ORDER — AMIODARONE HCL 400 MG PO TABS: 400.0000 mg | ORAL_TABLET | Freq: Every day | ORAL | 3 refills | Status: DC

## 2022-12-06 MED ORDER — AMIODARONE HCL 200 MG PO TABS: 400.0000 mg | ORAL_TABLET | Freq: Two times a day (BID) | ORAL | 5 refills | Status: DC

## 2022-12-06 NOTE — Telephone Encounter (Signed)
Following alert received from [employer] received for [employer] alert for VT with successful ATP therapy Events occurred 4/1 @ 20:22 and 20:23, EGM's show sustained VT, ATP delivered x1 with termination for 5 beats with return to VT, rate 153, duration 50sec, second event shows sustained VT, rate 155, burst of ATP converts rhythm to AP/VS with PVC's, duration 44sec.  There is a NSVT event @ 23:22 showing a brief termination with return to sustained VT below detection.  Presenting AS/VP, [place] stable, PVC's=28% Route to triage.  Patient saw Dr. Caryl Comes in office 12/05/22 for VT. Routing to Dr. Caryl Comes for review.

## 2022-12-06 NOTE — Patient Instructions (Addendum)
[name] , Thank you for taking time to come for your Medicare Wellness Visit. I appreciate your ongoing commitment to your health goals. Please review the following plan we discussed and let me know if I can assist you in the future.   These are the goals we discussed:  Goals       No Current goals (pt-stated)        This is a list of the screening recommended for you and due dates:  Health Maintenance  Topic Date Due   Complete foot exam   03/06/2018   Eye exam for diabetics  12/06/2022*   COVID-19 Vaccine (5 - 2023-24 season) 12/22/2022*   Zoster (Shingles) Vaccine (1 of 2) 03/07/2023*   Colon Cancer Screening  12/06/2023*   Hemoglobin A1C  02/17/2023   Flu Shot  04/06/2023   Medicare Annual Wellness Visit  12/06/2023   DTaP/Tdap/Td vaccine (2 - Td or Tdap) 08/10/2027   Hepatitis C Screening: USPSTF Recommendation to screen - Ages 12-79 yo.  Completed   HIV Screening  Completed   HPV Vaccine  Aged Out  *Topic was postponed. The date shown is not the original due date.  Opioid Pain Medicine Management Opioids are powerful medicines that are used to treat moderate to severe pain. When used for short periods of time, they can help you to: Sleep better. Do better in physical or occupational therapy. Feel better in the first few days after an injury. Recover from surgery. Opioids should be taken with the supervision of a trained health care provider. They should be taken for the shortest period of time possible. This is because opioids can be addictive, and the longer you take opioids, the greater your risk of addiction. This addiction can also be called opioid use disorder. What are the risks? Using opioid pain medicines for longer than 3 days increases your risk of side effects. Side effects include: Constipation. Nausea and vomiting. Breathing difficulties (respiratory depression). Drowsiness. Confusion. Opioid use disorder. Itching. Taking opioid pain medicine for a long  period of time can affect your ability to do daily tasks. It also puts you at risk for: Motor vehicle crashes. Depression. Suicide. Heart attack. Overdose, which can be life-threatening. What is a pain treatment plan? A pain treatment plan is an agreement between you and your health care provider. Pain is unique to each person, and treatments vary depending on your condition. To manage your pain, you and your health care provider need to work together. To help you do this: Discuss the goals of your treatment, including how much pain you might expect to have and how you will manage the pain. Review the risks and benefits of taking opioid medicines. Remember that a good treatment plan uses more than one approach and minimizes the chance of side effects. Be honest about the amount of medicines you take and about any drug or alcohol use. Get pain medicine prescriptions from only one health care provider. Pain can be managed with many types of alternative treatments. Ask your health care provider to refer you to one or more specialists who can help you manage pain through: Physical or occupational therapy. Counseling (cognitive behavioral therapy). Good nutrition. Biofeedback. Massage. Meditation. Non-opioid medicine. Following a gentle exercise program. How to use opioid pain medicine Taking medicine Take your pain medicine exactly as told by your health care provider. Take it only when you need it. If your pain gets less severe, you may take less than your prescribed dose if your  health care provider approves. If you are not having pain, do nottake pain medicine unless your health care provider tells you to take it. If your pain is severe, do nottry to treat it yourself by taking more pills than instructed on your prescription. Contact your health care provider for help. Write down the times when you take your pain medicine. It is easy to become confused while on pain medicine. Writing the  time can help you avoid overdose. Take other over-the-counter or prescription medicines only as told by your health care provider. Keeping yourself and others safe  While you are taking opioid pain medicine: Do not drive, use machinery, or power tools. Do not sign legal documents. Do not drink alcohol. Do not take sleeping pills. Do not supervise children by yourself. Do not do activities that require climbing or being in high places. Do not go to a lake, river, ocean, spa, or swimming pool. Do not share your pain medicine with anyone. Keep pain medicine in a locked cabinet or in a secure area where pets and children cannot reach it. Stopping your use of opioids If you have been taking opioid medicine for more than a few weeks, you may need to slowly decrease (taper) how much you take until you stop completely. Tapering your use of opioids can decrease your risk of symptoms of withdrawal, such as: Pain and cramping in the abdomen. Nausea. Sweating. Sleepiness. Restlessness. Uncontrollable shaking (tremors). Cravings for the medicine. Do not attempt to taper your use of opioids on your own. Talk with your health care provider about how to do this. Your health care provider may prescribe a step-down schedule based on how much medicine you are taking and how long you have been taking it. Getting rid of leftover pills Do not save any leftover pills. Get rid of leftover pills safely by: Taking the medicine to a prescription take-back program. This is usually offered by the county or law enforcement. Bringing them to a pharmacy that has a drug disposal container. Flushing them down the toilet. Check the label or package insert of your medicine to see whether this is safe to do. Throwing them out in the trash. Check the label or package insert of your medicine to see whether this is safe to do. If it is safe to throw it out, remove the medicine from the original container, put it into a  sealable bag or container, and mix it with used coffee grounds, food scraps, dirt, or cat litter before putting it in the trash. Follow these instructions at home: Activity Do exercises as told by your health care provider. Avoid activities that make your pain worse. Return to your normal activities as told by your health care provider. Ask your health care provider what activities are safe for you. General instructions You may need to take these actions to prevent or treat constipation: Drink enough fluid to keep your urine pale yellow. Take over-the-counter or prescription medicines. Eat foods that are high in fiber, such as beans, whole grains, and fresh fruits and vegetables. Limit foods that are high in fat and processed sugars, such as fried or sweet foods. Keep all follow-up visits. This is important. Where to find support If you have been taking opioids for a long time, you may benefit from receiving support for quitting from a local support group or counselor. Ask your health care provider for a referral to these resources in your area. Where to find more information Centers for Disease Control and  Prevention (CDC): http://www.wolf.info/ [place] Food and Drug Administration (FDA): [place] Get help right away if: You may have taken too much of an opioid (overdosed). Common symptoms of an overdose: Your breathing is slower or more shallow than normal. You have a very slow heartbeat (pulse). You have slurred speech. You have nausea and vomiting. Your pupils become very small. You have other potential symptoms: You are very confused. You faint or feel like you will faint. You have cold, clammy skin. You have blue lips or fingernails. You have thoughts of harming yourself or harming others. These symptoms may represent a serious problem that is an emergency. Do not wait to see if the symptoms will go away. Get medical help right away. Call your local emergency services (911 in the [place] Do  not drive yourself to the hospital.  If you ever feel like you may hurt yourself or others, or have thoughts about taking your own life, get help right away. Go to your nearest emergency department or: Call your local emergency services (911 in the [place] Call the [facility] 410-764-9371 in the [place] Call a suicide crisis helpline, such as the [facility] at 316-506-2039 or 988 in the [place]. This is open 24 hours a day in the [place] Text the Crisis Text Line at 612-116-4609 (in the [place].). Summary Opioid medicines can help you manage moderate to severe pain for a short period of time. A pain treatment plan is an agreement between you and your health care provider. Discuss the goals of your treatment, including how much pain you might expect to have and how you will manage the pain. If you think that you or someone else may have taken too much of an opioid, get medical help right away. This information is not intended to replace advice given to you by your health care provider. Make sure you discuss any questions you have with your health care provider. Document Revised: 03/17/2021 Document Reviewed: 12/02/2020 Elsevier Patient Education  [place] directives: In Chart  Conditions/risks identified: None  Next appointment: Follow up in one year for your annual wellness visit   Preventive Care 40-64 Years, Male Preventive care refers to lifestyle choices and visits with your health care provider that can promote health and wellness. What does preventive care include? A yearly physical exam. This is also called an annual well check. Dental exams once or twice a year. Routine eye exams. Ask your health care provider how often you should have your eyes checked. Personal lifestyle choices, including: Daily care of your teeth and gums. Regular physical activity. Eating a healthy diet. Avoiding tobacco and drug use. Limiting alcohol  use. Practicing safe sex. Taking low-dose aspirin every day starting at age 58. What happens during an annual well check? The services and screenings done by your health care provider during your annual well check will depend on your age, overall health, lifestyle risk factors, and family history of disease. Counseling  Your health care provider may ask you questions about your: Alcohol use. Tobacco use. Drug use. Emotional well-being. Home and relationship well-being. Sexual activity. Eating habits. Work and work Statistician. Screening  You may have the following tests or measurements: Height, weight, and BMI. Blood pressure. Lipid and cholesterol levels. These may be checked every 5 years, or more frequently if you are over 49 years old. Skin check. Lung cancer screening. You may have this screening every year starting at age 18 if you have a  30-pack-year history of smoking and currently smoke or have quit within the past 15 years. Fecal occult blood test (FOBT) of the stool. You may have this test every year starting at age 48. Flexible sigmoidoscopy or colonoscopy. You may have a sigmoidoscopy every 5 years or a colonoscopy every 10 years starting at age 22. Prostate cancer screening. Recommendations will vary depending on your family history and other risks. Hepatitis C blood test. Hepatitis B blood test. Sexually transmitted disease (STD) testing. Diabetes screening. This is done by checking your blood sugar (glucose) after you have not eaten for a while (fasting). You may have this done every 1-3 years. Discuss your test results, treatment options, and if necessary, the need for more tests with your health care provider. Vaccines  Your health care provider may recommend certain vaccines, such as: Influenza vaccine. This is recommended every year. Tetanus, diphtheria, and acellular pertussis (Tdap, Td) vaccine. You may need a Td booster every 10 years. Zoster vaccine. You may  need this after age 78. Pneumococcal 13-valent conjugate (PCV13) vaccine. You may need this if you have certain conditions and have not been vaccinated. Pneumococcal polysaccharide (PPSV23) vaccine. You may need one or two doses if you smoke cigarettes or if you have certain conditions. Talk to your health care provider about which screenings and vaccines you need and how often you need them. This information is not intended to replace advice given to you by your health care provider. Make sure you discuss any questions you have with your health care provider. Document Released: 09/18/2015 Document Revised: 05/11/2016 Document Reviewed: 06/23/2015 Elsevier Interactive Patient Education  2017 [place] Prevention in the Home Falls can cause injuries. They can happen to people of all ages. There are many things you can do to make your home safe and to help prevent falls. What can I do on the outside of my home? Regularly fix the edges of walkways and driveways and fix any cracks. Remove anything that might make you trip as you walk through a door, such as a raised step or threshold. Trim any bushes or trees on the path to your home. Use bright outdoor lighting. Clear any walking paths of anything that might make someone trip, such as rocks or tools. Regularly check to see if handrails are loose or broken. Make sure that both sides of any steps have handrails. Any raised decks and porches should have guardrails on the edges. Have any leaves, snow, or ice cleared regularly. Use sand or salt on walking paths during winter. Clean up any spills in your garage right away. This includes oil or grease spills. What can I do in the bathroom? Use night lights. Install grab bars by the toilet and in the tub and shower. Do not use towel bars as grab bars. Use non-skid mats or decals in the tub or shower. If you need to sit down in the shower, use a plastic, non-slip stool. Keep the floor dry.  Clean up any water that spills on the floor as soon as it happens. Remove soap buildup in the tub or shower regularly. Attach bath mats securely with double-sided non-slip rug tape. Do not have throw rugs and other things on the floor that can make you trip. What can I do in the bedroom? Use night lights. Make sure that you have a light by your bed that is easy to reach. Do not use any sheets or blankets that are too big for your bed. They should  not hang down onto the floor. Have a firm chair that has side arms. You can use this for support while you get dressed. Do not have throw rugs and other things on the floor that can make you trip. What can I do in the kitchen? Clean up any spills right away. Avoid walking on wet floors. Keep items that you use a lot in easy-to-reach places. If you need to reach something above you, use a strong step stool that has a grab bar. Keep electrical cords out of the way. Do not use floor polish or wax that makes floors slippery. If you must use wax, use non-skid floor wax. Do not have throw rugs and other things on the floor that can make you trip. What can I do with my stairs? Do not leave any items on the stairs. Make sure that there are handrails on both sides of the stairs and use them. Fix handrails that are broken or loose. Make sure that handrails are as long as the stairways. Check any carpeting to make sure that it is firmly attached to the stairs. Fix any carpet that is loose or worn. Avoid having throw rugs at the top or bottom of the stairs. If you do have throw rugs, attach them to the floor with carpet tape. Make sure that you have a light switch at the top of the stairs and the bottom of the stairs. If you do not have them, ask someone to add them for you. What else can I do to help prevent falls? Wear shoes that: Do not have high heels. Have rubber bottoms. Are comfortable and fit you well. Are closed at the toe. Do not wear sandals. If  you use a stepladder: Make sure that it is fully opened. Do not climb a closed stepladder. Make sure that both sides of the stepladder are locked into place. Ask someone to hold it for you, if possible. Clearly mark and make sure that you can see: Any grab bars or handrails. First and last steps. Where the edge of each step is. Use tools that help you move around (mobility aids) if they are needed. These include: Canes. Walkers. Scooters. Crutches. Turn on the lights when you go into a dark area. Replace any light bulbs as soon as they burn out. Set up your furniture so you have a clear path. Avoid moving your furniture around. If any of your floors are uneven, fix them. If there are any pets around you, be aware of where they are. Review your medicines with your doctor. Some medicines can make you feel dizzy. This can increase your chance of falling. Ask your doctor what other things that you can do to help prevent falls. This information is not intended to replace advice given to you by your health care provider. Make sure you discuss any questions you have with your health care provider. Document Released: 06/18/2009 Document Revised: 01/28/2016 Document Reviewed: 09/26/2014 Elsevier Interactive Patient Education  2017 [name].

## 2022-12-06 NOTE — Telephone Encounter (Signed)
Pt had 3 more VT episodes overnight- all terminated by [facility]. Discussed with Dr Aundra Dubin- will continue Amiodarone 400 mg BID x 1 additional week, then 200 mg BID x 1 week, then 200 mg daily, with Mexiletine 150 mg BID, and will ask HD to decrease volume removal with treatments.   I spoke with Linwood Dibbles at [facility] regarding need to decrease volume removal. Current dry weight 78 kg. Current treatment they are removing 3 L. Will decrease remove volume to 2.5 L at this time.   Spoke with pt regarding the above the changes. He verbalized understanding. Updated prescription sent to pt's pharmacy.  [name] RN [place] Coordinator  Office: [phone]  24/7 Pager: [phone]

## 2022-12-06 NOTE — Progress Notes (Cosign Needed)
Subjective:   [name] is a 57 y.o. male who presents for Medicare Annual/Subsequent preventive examination.  Review of Systems    Virtual Visit via Telephone Note  I connected with  Cortlan E Burtis on 12/06/22 at  3:00 PM EDT by telephone and verified that I am speaking with the correct person using two identifiers.  Location: Patient: Home Provider: Office Persons participating in the virtual visit: patient/Nurse Health Advisor   I discussed the limitations, risks, security and privacy concerns of performing an evaluation and management service by telephone and the availability of in person appointments. The patient expressed understanding and agreed to proceed.  Interactive audio and video telecommunications were attempted between this nurse and patient, however failed, due to patient having technical difficulties OR patient did not have access to video capability.  We continued and completed visit with audio only.  Some vital signs may be absent or patient reported.   Criselda Peaches, LPN  Cardiac Risk Factors include: advanced age (>58men, >47 women);male gender;sedentary lifestyle;diabetes mellitus;hypertension     Objective:    Today's Vitals   12/06/22 1524  Weight: 170 lb 14.4 oz (77.5 kg)  Height: 5\' 8"  (1.727 m)   Body mass index is 25.99 kg/m.     12/06/2022    3:36 PM 05/20/2022    2:52 PM 09/08/2021    3:12 PM 08/25/2021    3:31 PM 08/22/2019    1:48 PM 08/09/2019    3:02 PM 07/30/2019   12:00 PM  Advanced Directives  Does Patient Have a Medical Advance Directive? Yes Yes No No No No No  Type of Paramedic of [place];Living will        Does patient want to make changes to medical advance directive? No - Patient declined        Copy of [place] in Chart? Yes - validated most recent copy scanned in chart (See row information)        Would patient like information on creating a medical advance  directive?   No - Patient declined No - Patient declined No - Patient declined No - Patient declined No - Patient declined    Current Medications (verified) Outpatient Encounter Medications as of 12/06/2022  Medication Sig   amiodarone (PACERONE) 400 MG tablet Take 1 tablet (400 mg total) by mouth daily. Please take 1 tablet (400 mg) by mouth twice daily for 1 week, then decrease to 200 mg (1/2 tablet) twice a day for 1 week, then resume a dose of 200 mg (1/2 tablet) daily.   aspirin EC 81 MG tablet Take 1 tablet (81 mg total) by mouth daily.   atorvastatin (LIPITOR) 40 MG tablet Take 1 tablet (40 mg total) by mouth daily at 6 PM.   benzonatate (TESSALON) 100 MG capsule Take 2 capsules (200 mg total) by mouth 3 (three) times daily as needed for cough.   calcitRIOL (ROCALTROL) 0.5 MCG capsule Take 1 capsule (0.5 mcg total) by mouth every Monday, Wednesday, and Friday.   cefadroxil (DURICEF) 500 MG capsule Take 2 capsules (1,000 mg total) by mouth 3 (three) times a week. Take AFTER dialysis sessions   citalopram (CELEXA) 20 MG tablet Take 1 tablet (20 mg total) by mouth daily.   COLACE 100 MG capsule Take 100 mg by mouth daily as needed for moderate constipation.   fluticasone (FLONASE) 50 MCG/ACT nasal spray Place 1 spray into both nostrils daily.   hydrALAZINE (APRESOLINE) 25 MG tablet  Take 1 tablet three times a day on NON-dialysis days only   insulin glargine (LANTUS) 100 UNIT/ML injection Inject 0.35 mLs (35 Units total) into the skin every morning.   levocetirizine (XYZAL) 5 MG tablet Take 1 tablet (5 mg total) by mouth every evening.   LORazepam (ATIVAN) 0.5 MG tablet Take 1 tablet (0.5 mg total) by mouth every 12 (twelve) hours as needed (nausea).   metoCLOPramide (REGLAN) 10 MG tablet Take 1 tablet (10 mg total) by mouth 3 (three) times daily. (Patient taking differently: Take 10 mg by mouth 3 (three) times daily before meals.)   mexiletine (MEXITIL) 150 MG capsule Take 1 capsule (150 mg  total) by mouth 2 (two) times daily.   midodrine (PROAMATINE) 5 MG tablet Take 2 tablets (10 mg total) by mouth every Monday, Wednesday, and Friday. Take 1 tablet before dialysis treatment   multivitamin (RENA-VIT) TABS tablet Take 1 tablet by mouth daily.   nitroGLYCERIN (NITROSTAT) 0.4 MG SL tablet Place 1 tablet (0.4 mg total) under the tongue every 5 (five) minutes as needed for chest pain.   ondansetron (ZOFRAN-ODT) 4 MG disintegrating tablet Take by mouth as needed.   oxyCODONE-acetaminophen (PERCOCET) 7.5-325 MG tablet Take 1 tablet by mouth 3 (three) times daily as needed for pain.   pantoprazole (PROTONIX) 40 MG tablet Take 1 tablet (40 mg total) by mouth 2 (two) times daily before lunch and supper.   promethazine (PHENERGAN) 12.5 MG tablet TAKE 1 TABLET (12.5 MG TOTAL) BY MOUTH EVERY 6 (SIX) HOURS AS NEEDED FOR NAUSEA OR VOMITING.   sevelamer carbonate (RENVELA) 0.8 g PACK packet Take 0.8 g by mouth 3 (three) times daily with meals.   triamcinolone ointment (KENALOG) 0.1 % Apply 1 Application topically 2 (two) times daily.   warfarin (COUMADIN) 5 MG tablet TAKE 1 TABLET BY MOUTH EVERY DAY   No facility-administered encounter medications on file as of 12/06/2022.    Allergies (verified) Metformin and related   History: Past Medical History:  Diagnosis Date   Angina    ASCVD (arteriosclerotic cardiovascular disease)    , Anterior infarction 2005, LAD diagonal bifurcation intervention 03/2004   Automatic implantable cardiac defibrillator -St. Jude's        Benign neoplasm of colon    CHF (congestive heart failure)    Chronic systolic heart failure    Coronary artery disease     Widely patent previously placed stents in the left anterior    Crohn's disease    Deep venous thrombosis    Recurrent-on Coumadin   Dialysis patient    Dyspnea    Gastroparesis    GERD (gastroesophageal reflux disease)    High cholesterol    Hyperlipidemia    Hypersomnolent    Previous diagnosis  of narcolepsy   Hypertension, essential    Ischemic cardiomyopathy    Ejection fraction 15-20% catheterization 2010   Type II or unspecified type diabetes mellitus without mention of complication, not stated as uncontrolled    Unspecified gastritis and gastroduodenitis without mention of hemorrhage    Past Surgical History:  Procedure Laterality Date   A/V SHUNTOGRAM N/A 05/24/2018   Procedure: A/V SHUNTOGRAM;  Surgeon: Marty Heck, MD;  Location: [facility];  Service: Cardiovascular;  Laterality: N/A;   ABDOMINAL AORTOGRAM W/LOWER EXTREMITY N/A 03/18/2019   Procedure: ABDOMINAL AORTOGRAM W/LOWER EXTREMITY;  Surgeon: Waynetta Sandy, MD;  Location: [facility];  Service: Cardiovascular;  Laterality: N/A;   AMPUTATION Right 03/21/2019   Procedure: AMPUTATION  DIGIT RIGHT FOURTH TOE;  Surgeon: Waynetta Sandy, MD;  Location: [place];  Service: Vascular;  Laterality: Right;   AMPUTATION Left 05/29/2019   Procedure: AMPUTATION LEFT GREAT TOE;  Surgeon: Serafina Mitchell, MD;  Location: [place];  Service: Vascular;  Laterality: Left;   AMPUTATION Right 06/02/2019   Procedure: ABOVE KNEE AMPUTATION;  Surgeon: Rosetta Posner, MD;  Location: [place];  Service: Vascular;  Laterality: Right;   AMPUTATION Left 07/23/2019   Procedure: AMPUTATION ABOVE KNEE LEFT;  Surgeon: Elam Dutch, MD;  Location: [place];  Service: Vascular;  Laterality: Left;   APPENDECTOMY     AV FISTULA PLACEMENT Right 06/26/2017   Procedure: ARTERIOVENOUS (AV) FISTULA CREATION VERSUS GRAFT RIGHT  ARM;  Surgeon: Conrad Buchanan Dam, MD;  Location: [place];  Service: Vascular;  Laterality: Right;   AV FISTULA PLACEMENT Right 10/31/2017   Procedure: INSERTION OF ARTERIOVENOUS (AV) ARTEGRAFT,  RIGHT UPPER ARM;  Surgeon: Conrad Normangee, MD;  Location: [place];  Service: Vascular;  Laterality: Right;   AV FISTULA PLACEMENT Left 05/29/2018   Procedure: ARTERIOVENOUS (AV) FISTULA CREATION  LEFT UPPER EXTREMITY;   Surgeon: Waynetta Sandy, MD;  Location: [place];  Service: Vascular;  Laterality: Left;   AV FISTULA PLACEMENT Left 08/09/2018   Procedure: INSERTION OF ARTERIOVENOUS (AV) GORE-TEX GRAFT LEFT UPPER ARM;  Surgeon: Waynetta Sandy, MD;  Location: [place];  Service: Vascular;  Laterality: Left;   CARDIAC DEFIBRILLATOR PLACEMENT  2010   St. Jude ICD   CENTRAL LINE INSERTION Right 05/24/2018   Procedure: CENTRAL LINE INSERTION;  Surgeon: Marty Heck, MD;  Location: [facility];  Service: Cardiovascular;  Laterality: Right;   COLECTOMY  ~ 2003   "for Crohn's" ascending colon.     CORONARY STENT INTERVENTION N/A 11/25/2016   Procedure: Coronary Stent Intervention;  Surgeon: Leonie Man, MD;  Location: [facility];  Service: Cardiovascular;  Laterality: N/A;   EP IMPLANTABLE DEVICE N/A 09/14/2016   Procedure: ICD Generator Changeout;  Surgeon: Deboraha Sprang, MD;  Location: [facility];  Service: Cardiovascular;  Laterality: N/A;   ESOPHAGOGASTRODUODENOSCOPY N/A 07/12/2017   Procedure: ESOPHAGOGASTRODUODENOSCOPY (EGD);  Surgeon: Jerene Bears, MD;  [facility];  Service: Gastroenterology;  Laterality: N/A;  VAD pt so VAD team will need to accompany pt.    FETAL SURGERY FOR CONGENITAL HERNIA     ????? pt has no knowledge of this.[name]     FISTULOGRAM Left 08/09/2018   Procedure: FISTULOGRAM LEFT ARM;  Surgeon: Waynetta Sandy, MD;  Location: [place];  Service: Vascular;  Laterality: Left;   INGUINAL HERNIA REPAIR     INSERTION OF DIALYSIS CATHETER Right 06/26/2017   Procedure: INSERTION OF TUNNELED  DIALYSIS CATHETER RIGHT INTERNAL JUGULAR;  Surgeon: Conrad [place], MD;  Location: [place];  Service: Vascular;  Laterality: Right;   INSERTION OF IMPLANTABLE LEFT VENTRICULAR ASSIST DEVICE N/A 06/12/2017   Procedure: INSERTION OF IMPLANTABLE LEFT VENTRICULAR ASSIST [place] 3;  Surgeon: Ivin Poot, MD;  Location: [place];  Service: Open  Heart Surgery;  Laterality: N/A;  HM3 LVAD  CIRC ARREST  NITRIC OXIDE   INSERTION OF IMPLANTABLE LEFT VENTRICULAR ASSIST DEVICE N/A 02/28/2018   Procedure: REDO INSERTION OF IMPLANTABLE LEFT VENTRICULAR ASSIST DEVICE - HEARTMATE 3;  Surgeon: Ivin Poot, MD;  Location: [place];  Service: Open Heart Surgery;  Laterality: N/A;   IR FLUORO GUIDE CV LINE RIGHT  07/22/2019  IR REMOVAL TUN CV CATH W/O FL  02/26/2018   IR THORACENTESIS ASP PLEURAL SPACE W/IMG GUIDE  06/28/2017   IR US GUIDE VASC ACCESS RIGHT  07/22/2019   LIGATION OF ARTERIOVENOUS  FISTULA Right 10/31/2017   Procedure: LIGATION OF BRACHIO-BASILIC VEIN TRANSPOSITION RIGHT ARM;  Surgeon: Conrad [place], MD;  Location: [place];  Service: Vascular;  Laterality: Right;   LOWER EXTREMITY ANGIOGRAPHY  05/02/2019   Procedure: Lower Extremity Angiography;  Surgeon: Marty Heck, MD;  Location: [place] [facility];  Service: Cardiovascular;;   MULTIPLE EXTRACTIONS WITH ALVEOLOPLASTY N/A 06/08/2017   Procedure: MULTIPLE EXTRACTION WITH ALVEOLOPLASTY AND PRE PROSTHETIC SURGERY AS NEEDED;  Surgeon: Lenn Cal, DDS;  Location: [place];  Service: Oral Surgery;  Laterality: N/A;   PERIPHERAL VASCULAR BALLOON ANGIOPLASTY  03/18/2019   Procedure: PERIPHERAL VASCULAR BALLOON ANGIOPLASTY;  Surgeon: Waynetta Sandy, MD;  Location: [facility];  Service: Cardiovascular;;  RT PT   PERIPHERAL VASCULAR INTERVENTION  03/18/2019   Procedure: PERIPHERAL VASCULAR INTERVENTION;  Surgeon: Waynetta Sandy, MD;  Location: [facility];  Service: Cardiovascular;;  RT SFA   PERIPHERAL VASCULAR INTERVENTION  05/02/2019   Procedure: PERIPHERAL VASCULAR INTERVENTION;  Surgeon: Marty Heck, MD;  Location: [facility];  Service: Cardiovascular;;  Lt. SFA   RIGHT HEART CATH N/A 06/07/2017   Procedure: RIGHT HEART CATH;  Surgeon: Larey Dresser, MD;  Location: [facility];  Service: Cardiovascular;  Laterality:  N/A;   RIGHT HEART CATH N/A 02/08/2018   Procedure: RIGHT HEART CATH;  Surgeon: Larey Dresser, MD;  Location: [facility];  Service: Cardiovascular;  Laterality: N/A;   RIGHT HEART CATH N/A 08/09/2018   Procedure: RIGHT HEART CATH;  Surgeon: Larey Dresser, MD;  Location: [facility];  Service: Cardiovascular;  Laterality: N/A;   RIGHT/LEFT HEART CATH AND CORONARY ANGIOGRAPHY N/A 11/23/2016   Procedure: Right/Left Heart Cath and Coronary Angiography;  Surgeon: Larey Dresser, MD;  Location: [place] [facility];  Service: Cardiovascular;  Laterality: N/A;   TEE WITHOUT CARDIOVERSION N/A 06/12/2017   Procedure: TRANSESOPHAGEAL ECHOCARDIOGRAM (TEE);  Surgeon: Prescott Gum, Collier Salina, MD;  Location: [place];  Service: Open Heart Surgery;  Laterality: N/A;   TEE WITHOUT CARDIOVERSION N/A 02/28/2018   Procedure: TRANSESOPHAGEAL ECHOCARDIOGRAM (TEE);  Surgeon: Prescott Gum, Collier Salina, MD;  Location: [place];  Service: Open Heart Surgery;  Laterality: N/A;   TEMPORARY DIALYSIS CATHETER Left 05/24/2018   Procedure: TEMPORARY DIALYSIS CATHETER;  Surgeon: Marty Heck, MD;  Location:  [facility];  Service: Cardiovascular;  Laterality: Left;   THROMBECTOMY AND REVISION OF ARTERIOVENTOUS (AV) GORETEX  GRAFT Right 12/12/2017   Procedure: THROMBECTOMY ARTERIOVENTOUS (AV) GORETEX  GRAFT RIGHT UPPER ARM;  Surgeon: Conrad [place], MD;  Location: [place];  Service: Vascular;  Laterality: Right;   TRANSMETATARSAL AMPUTATION Right 04/30/2019   Procedure: RIGHT TRANSMETATARSAL AMPUTATION;  Surgeon: Waynetta Sandy, MD;  Location: [place];  Service: Vascular;  Laterality: Right;   Family History  Problem Relation Age of Onset   Colon polyps Mother    Diabetes Mother    Heart attack Father    Diabetes Father    Stroke Paternal Grandfather    Colitis Sister    Heart disease Brother    Colitis Sister    Colon cancer Neg Hx    Social History   Socioeconomic History   Marital status: Married     Spouse name: Not  on file   Number of children: Not on file   Years of education: Not on file   Highest education level: Not on file  Occupational History   Not on file  Tobacco Use   Smoking status: Never    Passive exposure: Past   Smokeless tobacco: Never  Vaping Use   Vaping Use: Never used  Substance and Sexual Activity   Alcohol use: No   Drug use: No   Sexual activity: Never  Other Topics Concern   Not on file  Social History Narrative   Not on file   Social Determinants of Health   Financial Resource Strain: Low Risk  (12/06/2022)   Overall Financial Resource Strain (CARDIA)    Difficulty of Paying Living Expenses: Not hard at all  Food Insecurity: No Food Insecurity (12/06/2022)   Hunger Vital Sign    Worried About Running Out of Food in the Last Year: Never true    [place] in the Last Year: Never true  Transportation Needs: No Transportation Needs (11/19/2019)   PRAPARE - Hydrologist (Medical): No    Lack of Transportation (Non-Medical): No  Physical Activity: Inactive (12/06/2022)   Exercise Vital Sign    Days of Exercise per Week: 0 days    Minutes of Exercise per Session: 0 min  Stress: No Stress Concern Present (12/06/2022)   [place]    Feeling of Stress : Not at all  Social Connections: [place] (12/06/2022)   Social Connection and Isolation Panel [NHANES]    Frequency of Communication with Friends and Family: More than three times a week    Frequency of Social Gatherings with Friends and Family: More than three times a week    Attends Religious Services: More than 4 times per year    Active Member of [employer] or Organizations: Yes    Attends Music therapist: More than 4 times per year    Marital Status: Married    Tobacco Counseling Counseling given: Not Answered   Clinical Intake:  Pre-visit preparation completed: No  Pain :  No/denies painNutrition Risk Assessment:  Has the patient had any N/V/D within the last 2 months?  No  Does the patient have any non-healing wounds?  No  Has the patient had any unintentional weight loss or weight gain?  No   Diabetes:  Is the patient diabetic?  Yes  If diabetic, was a CBG obtained today?  Yes CBG 115 Taken by Patient Did the patient bring in their glucometer from home?  No  How often do you monitor your CBG's? Daily.   Financial Strains and Diabetes Management:  Are you having any financial strains with the device, your supplies or your medication? No .  Does the patient want to be seen by Chronic Care Management for management of their diabetes?  No  Would the patient like to be referred to a Nutritionist or for Diabetic Management?  No   Diabetic Exams:  Diabetic Eye Exam: Completed Yes. Overdue for diabetic eye exam. Pt has been advised about the importance in completing this exam. A referral has been placed today. Message sent to referral coordinator for scheduling purposes. Advised pt to expect a call from office referred to regarding appt.  Diabetic Foot Exam: Completed Yes. Pt has been advised about the importance in completing this exam. Pt is scheduled for diabetic foot exam on Followed by PCP.  BMI - recorded: 25.99 Nutritional Status: BMI 25 -29 Overweight Nutritional Risks: None Diabetes: No  How often do you need to have someone help you when you read instructions, pamphlets, or other written materials from your doctor or pharmacy?: 1 - Never  Diabetic?  Yes  Interpreter Needed?: No  Information entered by :: Rolene Arbour LPN   Activities of Daily Living    12/06/2022    3:35 PM  In your present state of health, do you have any difficulty performing the following activities:  Hearing? 0  Vision? 0  Difficulty concentrating or making decisions? 0  Walking or climbing stairs? 0  Dressing or bathing? 0  Doing errands, shopping? 0   Preparing Food and eating ? N  Using the Toilet? N  In the past six months, have you accidently leaked urine? N  Do you have problems with loss of bowel control? N  Managing your Medications? N  Managing your Finances? N  Housekeeping or managing your Housekeeping? N    Patient Care Team: Precious Gilding, DO as PCP - General (Family Medicine) Larey Dresser, MD as PCP - Advanced Heart Failure (Cardiology) Deboraha Sprang, MD as PCP - Electrophysiology (Cardiology) Center, [facility] [place], [place] as Social Worker (Licensed Clinical Social Worker)  Indicate any recent [employer] you may have received from other than [facility] providers in the past year (date may be approximate).     Assessment:   This is a routine wellness examination for Ter[place].  Hearing/Vision screen Hearing Screening - Comments:: Denies hearing difficulties   Vision Screening - Comments:: Wears rx glasses - up to date with routine eye exams with  [facility]  Dietary issues and exercise activities discussed: Current Exercise Habits: The patient does not participate in regular exercise at present, Exercise limited by: orthopedic condition(s)   Goals Addressed               This Visit's Progress     No Current goals (pt-stated)         Depression Screen    12/06/2022    3:34 PM 08/18/2022    1:49 PM 09/08/2021    3:12 PM 08/25/2021    3:29 PM 08/09/2019    3:02 PM 06/07/2018    3:52 PM 06/06/2017    9:26 AM  PHQ 2/9 Scores  PHQ - 2 Score 0 0 0 0 0 0 0  PHQ- 9 Score  0 0 0       Fall Risk    12/06/2022    3:35 PM 06/28/2022   10:17 AM 09/08/2021    3:12 PM 08/25/2021    3:29 PM 08/09/2019    3:02 PM  [place] in the past year? 0 1 0 0 0  Number falls in past yr: 0 1 0 0 0  Injury with Fall? 0 0 0 0   Risk for fall due to : No Fall Risks History of fall(s);Impaired balance/gait;Impaired mobility     Follow up Falls prevention discussed Falls evaluation  completed       FALL RISK PREVENTION PERTAINING TO THE HOME:  Any stairs in or around the home? Yes  If so, are there any without handrails? No  Home free of loose throw rugs in walkways, pet beds, electrical cords, etc? Yes  Adequate lighting in your home to reduce risk of falls? Yes   ASSISTIVE DEVICES UTILIZED TO PREVENT FALLS:  Life alert? No  Use of  a cane, walker or w/c? Yes  Grab bars in the bathroom? No  Shower chair or bench in shower? No  Elevated toilet seat or a handicapped toilet? No   TIMED UP AND GO:  Was the test performed? No . Audio Visit  Cognitive Function:        12/06/2022    3:36 PM  6CIT Screen  What Year? 0 points  What month? 0 points  What time? 0 points  Count back from 20 0 points  Months in reverse 2 points  Repeat phrase 4 points  Total Score 6 points    Immunizations Immunization History  Administered Date(s) Administered   COVID-19, mRNA, vaccine(Comirnaty)12 years and older 06/28/2022   Hepatitis B, ADULT 07/19/2017, 08/16/2017, 10/18/2017, 01/10/2018   Influenza, Quadrivalent, Recombinant, Inj, Pf 06/24/2022   Influenza, Seasonal, Injecte, Preservative Fre 06/04/2018   Influenza,inj,Quad PF,6+ Mos 07/13/2016, 06/06/2017, 06/05/2018, 07/30/2019, 06/16/2021   Influenza-Unspecified 06/06/2017   Moderna Sars-Covid-2 Vaccination 11/22/2019, 12/18/2019   PFIZER(Purple Top)SARS-COV-2 Vaccination 06/01/2020   Pneumococcal Conjugate-13 07/25/2017   Pneumococcal Polysaccharide-23 07/30/2019   Pneumococcal-Unspecified 07/31/2017   Tdap 08/09/2017    TDAP status: Up to date  Flu Vaccine status: Up to date    Covid-19 vaccine status: Completed vaccines  Qualifies for Shingles Vaccine? Yes   Zostavax completed No   Shingrix Completed?: No.    Education has been provided regarding the importance of this vaccine. Patient has been advised to call insurance company to determine out of pocket expense if they have not yet received this  vaccine. Advised may also receive vaccine at local pharmacy or Health Dept. Verbalized acceptance and understanding.  Screening Tests Health Maintenance  Topic Date Due   FOOT EXAM  03/06/2018   OPHTHALMOLOGY EXAM  12/06/2022 (Originally 09/29/1975)   COVID-19 Vaccine (5 - 2023-24 season) 12/22/2022 (Originally 08/23/2022)   Zoster Vaccines- Shingrix (1 of 2) 03/07/2023 (Originally 09/30/1983)   COLONOSCOPY (Pts 45-20yrs Insurance coverage will need to be confirmed)  12/06/2023 (Originally 02/02/2021)   HEMOGLOBIN A1C  02/17/2023   INFLUENZA VACCINE  04/06/2023   Medicare Annual Wellness (AWV)  12/06/2023   DTaP/Tdap/Td (2 - Td or Tdap) 08/10/2027   Hepatitis C Screening  Completed   HIV Screening  Completed   HPV VACCINES  Aged Out    Health Maintenance  Health Maintenance Due  Topic Date Due   FOOT EXAM  03/06/2018    Colorectal cancer screening: Referral to GI placed Deferred. Pt aware the office will call re: appt.  Lung Cancer Screening: (Low Dose CT Chest recommended if Age 62-80 years, 30 pack-year currently smoking OR have quit w/in 15years.) does not qualify.    Additional Screening:  Hepatitis C Screening: does qualify; Completed 05/04/17  Vision Screening: Recommended annual ophthalmology exams for early detection of glaucoma and other disorders of the eye. Is the patient up to date with their annual eye exam?  Yes  Who is the provider or what is the name of the office in which the patient attends annual eye exams? [facility] If pt is not established with a provider, would they like to be referred to a provider to establish care? No .   Dental Screening: Recommended annual dental exams for proper oral hygiene  Community Resource Referral / Chronic Care Management:  CRR required this visit?  No   CCM required this visit?  No      Plan:     I have personally reviewed and noted the following in the patient's  chart:   Medical and social history Use of  alcohol, tobacco or illicit drugs  Current medications and supplements including opioid prescriptions. Patient is currently taking opioid prescriptions. Information provided to patient regarding non-opioid alternatives. Patient advised to discuss non-opioid treatment plan with their provider. Functional ability and status Nutritional status Physical activity Advanced directives List of other physicians Hospitalizations, surgeries, and ER visits in previous 12 months Vitals Screenings to include cognitive, depression, and falls Referrals and appointments  In addition, I have reviewed and discussed with patient certain preventive protocols, quality metrics, and best practice recommendations. A written personalized care plan for preventive services as well as general preventive health recommendations were provided to patient.     Criselda Peaches, LPN   [id number]   Nurse Notes: None

## 2022-12-07 ENCOUNTER — Other Ambulatory Visit: Payer: Self-pay | Admitting: Student

## 2022-12-07 NOTE — Telephone Encounter (Signed)
Noted  will keep track of burden on amiodarone  Lets get a full transmission on Monday please

## 2022-12-08 NOTE — Telephone Encounter (Signed)
Please have patient sent a transmission for Dr. Caryl Comes to review 12/12/22.  Thanks!

## 2022-12-09 ENCOUNTER — Ambulatory Visit (INDEPENDENT_AMBULATORY_CARE_PROVIDER_SITE_OTHER): Payer: Medicare Other

## 2022-12-09 DIAGNOSIS — I255 Ischemic cardiomyopathy: Secondary | ICD-10-CM | POA: Diagnosis not present

## 2022-12-10 LAB — CUP PACEART REMOTE DEVICE CHECK
Battery Remaining Longevity: 23 mo
Battery Remaining Percentage: 36 %
Battery Voltage: 2.86 V
Brady Statistic AP VP Percent: 1 %
Brady Statistic AP VS Percent: 7.2 %
Brady Statistic AS VP Percent: 86 %
Brady Statistic AS VS Percent: 1 %
Brady Statistic RA Percent Paced: 1 %
Brady Statistic RV Percent Paced: 86 %
Date Time Interrogation Session: 20240405020018
HighPow Impedance: 50 Ohm
HighPow Impedance: 50 Ohm
Implantable Lead Connection Status: 753985
Implantable Lead Connection Status: 753985
Implantable Lead Implant Date: 20100430
Implantable Lead Implant Date: 20100430
Implantable Lead Location: 753859
Implantable Lead Location: 753860
Implantable Lead Model: 7121
Implantable Pulse Generator Implant Date: 20180110
Lead Channel Impedance Value: 350 Ohm
Lead Channel Impedance Value: 680 Ohm
Lead Channel Pacing Threshold Amplitude: 1 V
Lead Channel Pacing Threshold Amplitude: 1.75 V
Lead Channel Pacing Threshold Pulse Width: 0.5 ms
Lead Channel Pacing Threshold Pulse Width: 1 ms
Lead Channel Sensing Intrinsic Amplitude: 0.8 mV
Lead Channel Sensing Intrinsic Amplitude: 4.7 mV
Lead Channel Setting Pacing Amplitude: 2.5 V
Lead Channel Setting Pacing Amplitude: 3 V
Lead Channel Setting Pacing Pulse Width: 1 ms
Lead Channel Setting Sensing Sensitivity: 0.5 mV
Pulse Gen Serial Number: 7383809

## 2022-12-11 ENCOUNTER — Encounter (HOSPITAL_BASED_OUTPATIENT_CLINIC_OR_DEPARTMENT_OTHER): Payer: Medicare Other | Admitting: Pulmonary Disease

## 2022-12-13 ENCOUNTER — Ambulatory Visit (HOSPITAL_COMMUNITY): Payer: Self-pay | Admitting: Pharmacist

## 2022-12-13 LAB — POCT INR: INR: 2.9 (ref 2.0–3.0)

## 2022-12-13 NOTE — Telephone Encounter (Signed)
I called the patient and requested a manual transmission. [name] agreed to send one. I told him as soon as I see it I will let Dr. Graciela Husbands know.

## 2022-12-13 NOTE — Telephone Encounter (Signed)
  Transmission received and printed it out for [name].

## 2022-12-19 ENCOUNTER — Other Ambulatory Visit (HOSPITAL_COMMUNITY): Payer: Self-pay | Admitting: Cardiology

## 2022-12-20 ENCOUNTER — Ambulatory Visit (HOSPITAL_COMMUNITY): Payer: Self-pay

## 2022-12-20 ENCOUNTER — Ambulatory Visit (HOSPITAL_COMMUNITY)
Admission: RE | Admit: 2022-12-20 | Discharge: 2022-12-20 | Disposition: A | Discharge status: Home / Self Care | Payer: Medicare Other | Source: Ambulatory Visit | Attending: Internal Medicine | Admitting: Internal Medicine

## 2022-12-20 DIAGNOSIS — Z95811 Presence of heart assist device: Secondary | ICD-10-CM | POA: Diagnosis present

## 2022-12-20 DIAGNOSIS — Z452 Encounter for adjustment and management of vascular access device: Secondary | ICD-10-CM | POA: Diagnosis present

## 2022-12-20 LAB — POCT INR: INR: 2.9 (ref 2.0–3.0)

## 2022-12-20 NOTE — Addendum Note (Signed)
Encounter addended by: Bernita Raisin, RN on: 12/20/2022 11:50 AM  Actions taken: Clinical Note Signed

## 2022-12-20 NOTE — Progress Notes (Addendum)
Received call from pt reporting a tear in white power cord lead casing. Requested pt come to clinic for VAD coordinator to evaluate.   Pt arrived to clinic via wheelchair with his son. Pt transferred to stretcher by his son. Pt reports he noted "feeling wet" on his side recently and saw small amount of green fluid at that time but was unsure where it was coming from. Upon examination green fluid noted on white power cord bend relief. Decision was made to electively change out pt's controller.    HeartMate 3 controller exchange performed at the bedside.  Reviewed steps for controller change out with pt and his son. We discussed that they should never attempt to do this at home without notifying VAD Pager first to triage need and provide support for the patient's safety as they may incur difficulties with the procedure and impair ability to restart pump. Pt tolerated controller exchange well and was asymptomatic. Pump restarted as expected with stable VAD parameters on correct prescribed speed of 5400 rpms and a flow of 3.5. Replace back up battery in 30 months.   Controller: [id number]  Man-07/03/22 exp 05/18/24   BUB:  [id number] exp 05/18/24   man 06/17/22  Alyce Pagan RN VAD Coordinator  Office: [phone]  24/7 Pager: [phone]

## 2022-12-27 ENCOUNTER — Ambulatory Visit (HOSPITAL_COMMUNITY): Payer: Self-pay

## 2022-12-27 LAB — POCT INR: INR: 2.8 (ref 2.0–3.0)

## 2023-01-03 ENCOUNTER — Telehealth: Payer: Self-pay

## 2023-01-03 ENCOUNTER — Ambulatory Visit (INDEPENDENT_AMBULATORY_CARE_PROVIDER_SITE_OTHER): Payer: Medicare Other | Admitting: Infectious Diseases

## 2023-01-03 ENCOUNTER — Other Ambulatory Visit: Payer: Self-pay

## 2023-01-03 ENCOUNTER — Ambulatory Visit (HOSPITAL_COMMUNITY): Payer: Self-pay

## 2023-01-03 DIAGNOSIS — Z91199 Patient's noncompliance with other medical treatment and regimen due to unspecified reason: Secondary | ICD-10-CM

## 2023-01-03 LAB — POCT INR: INR: 2.9 (ref 2.0–3.0)

## 2023-01-03 NOTE — Telephone Encounter (Signed)
Rexene Alberts, NP attempted to contact patient for today's video visit, no answer. She left message requesting call back.   Sandie Ano, RN

## 2023-01-03 NOTE — Progress Notes (Signed)
Patient did not answer for teleappointment - no show

## 2023-01-10 ENCOUNTER — Ambulatory Visit (HOSPITAL_COMMUNITY): Payer: Self-pay | Admitting: Pharmacist

## 2023-01-10 LAB — POCT INR: INR: 3.1 — AB (ref 2.0–3.0)

## 2023-01-11 NOTE — Progress Notes (Signed)
Remote ICD transmission.   

## 2023-01-17 ENCOUNTER — Ambulatory Visit (HOSPITAL_COMMUNITY): Payer: Self-pay | Admitting: Pharmacist

## 2023-01-23 ENCOUNTER — Telehealth: Payer: Self-pay

## 2023-01-23 NOTE — Telephone Encounter (Signed)
Alert received from [employer]:  Device alert for VT with therapy Events occurred 5/18 between 11:33-12:11 29 NSVT and 12 VT-1, EGM's show sustained VT during this time, HR's 150's, multiple burst of ATP unsuccessful at terminating arrhythmia, 30J of HV therapy delivered 12:11 converting arrhythmia.  Presenting AS/VP  Outreach made to Pt.  He states he might have felt his heart racing "a little bit" on Saturday.  He states he was napping when he received the shock. He denies any chest pain or SOB.    Pt is scheduled for follow up tomorrow with heart failure.  Advised would forward to them for review.  Pt aware of driving restriction.  States he does not drive.

## 2023-01-24 ENCOUNTER — Ambulatory Visit (HOSPITAL_BASED_OUTPATIENT_CLINIC_OR_DEPARTMENT_OTHER)
Admission: RE | Admit: 2023-01-24 | Discharge: 2023-01-24 | Disposition: A | Discharge status: Home / Self Care | Payer: Medicare Other | Source: Ambulatory Visit | Attending: Cardiology | Admitting: Cardiology

## 2023-01-24 ENCOUNTER — Encounter (HOSPITAL_COMMUNITY): Payer: Self-pay | Admitting: Cardiology

## 2023-01-24 ENCOUNTER — Other Ambulatory Visit (HOSPITAL_COMMUNITY): Payer: Self-pay | Admitting: Cardiology

## 2023-01-24 ENCOUNTER — Ambulatory Visit (HOSPITAL_COMMUNITY): Payer: Self-pay | Admitting: Pharmacist

## 2023-01-24 VITALS — BP 141/70 | HR 71 | Temp 97.8°F

## 2023-01-24 DIAGNOSIS — E1143 Type 2 diabetes mellitus with diabetic autonomic (poly)neuropathy: Secondary | ICD-10-CM | POA: Insufficient documentation

## 2023-01-24 DIAGNOSIS — N186 End stage renal disease: Secondary | ICD-10-CM | POA: Insufficient documentation

## 2023-01-24 DIAGNOSIS — I255 Ischemic cardiomyopathy: Secondary | ICD-10-CM | POA: Insufficient documentation

## 2023-01-24 DIAGNOSIS — D6851 Activated protein C resistance: Secondary | ICD-10-CM | POA: Insufficient documentation

## 2023-01-24 DIAGNOSIS — Z89612 Acquired absence of left leg above knee: Secondary | ICD-10-CM | POA: Insufficient documentation

## 2023-01-24 DIAGNOSIS — E1122 Type 2 diabetes mellitus with diabetic chronic kidney disease: Secondary | ICD-10-CM | POA: Insufficient documentation

## 2023-01-24 DIAGNOSIS — I132 Hypertensive heart and chronic kidney disease with heart failure and with stage 5 chronic kidney disease, or end stage renal disease: Secondary | ICD-10-CM | POA: Insufficient documentation

## 2023-01-24 DIAGNOSIS — Z992 Dependence on renal dialysis: Secondary | ICD-10-CM | POA: Insufficient documentation

## 2023-01-24 DIAGNOSIS — Z79899 Other long term (current) drug therapy: Secondary | ICD-10-CM | POA: Insufficient documentation

## 2023-01-24 DIAGNOSIS — I472 Ventricular tachycardia, unspecified: Secondary | ICD-10-CM

## 2023-01-24 DIAGNOSIS — Z95811 Presence of heart assist device: Secondary | ICD-10-CM

## 2023-01-24 DIAGNOSIS — I4892 Unspecified atrial flutter: Secondary | ICD-10-CM | POA: Insufficient documentation

## 2023-01-24 DIAGNOSIS — Z794 Long term (current) use of insulin: Secondary | ICD-10-CM | POA: Insufficient documentation

## 2023-01-24 DIAGNOSIS — Z7982 Long term (current) use of aspirin: Secondary | ICD-10-CM | POA: Insufficient documentation

## 2023-01-24 DIAGNOSIS — I2510 Atherosclerotic heart disease of native coronary artery without angina pectoris: Secondary | ICD-10-CM | POA: Insufficient documentation

## 2023-01-24 DIAGNOSIS — Z89611 Acquired absence of right leg above knee: Secondary | ICD-10-CM | POA: Insufficient documentation

## 2023-01-24 DIAGNOSIS — I509 Heart failure, unspecified: Secondary | ICD-10-CM

## 2023-01-24 DIAGNOSIS — I5022 Chronic systolic (congestive) heart failure: Secondary | ICD-10-CM | POA: Insufficient documentation

## 2023-01-24 DIAGNOSIS — G8929 Other chronic pain: Secondary | ICD-10-CM | POA: Insufficient documentation

## 2023-01-24 DIAGNOSIS — K3184 Gastroparesis: Secondary | ICD-10-CM | POA: Insufficient documentation

## 2023-01-24 DIAGNOSIS — I252 Old myocardial infarction: Secondary | ICD-10-CM | POA: Insufficient documentation

## 2023-01-24 DIAGNOSIS — Z7901 Long term (current) use of anticoagulants: Secondary | ICD-10-CM | POA: Insufficient documentation

## 2023-01-24 DIAGNOSIS — Z4801 Encounter for change or removal of surgical wound dressing: Secondary | ICD-10-CM | POA: Insufficient documentation

## 2023-01-24 LAB — POCT INR: INR: 3 (ref 2.0–3.0)

## 2023-01-24 LAB — ECHOCARDIOGRAM LIMITED: Est EF: <20

## 2023-01-24 MED ORDER — AMIODARONE HCL 400 MG PO TABS: 400.0000 mg | ORAL_TABLET | Freq: Every day | ORAL | 3 refills | Status: DC

## 2023-01-24 NOTE — Progress Notes (Signed)
Echocardiogram 2D Echocardiogram has been performed.  [name] 01/24/2023, 10:16 AM

## 2023-01-24 NOTE — Progress Notes (Addendum)
Patient presents for 2 month f/u with Ramp echo with his son. Denies issues with VAD equipment.   Denies lightheadedness, dizziness, falls, chest pain, and signs of bleeding. [name] transferred him from wheelchair to stretcher today. Reports good PO intake.   Pt reports VT episode on 5/18 with shock from his ICD. He says he was sleeping when this occurred. It was on a Saturday; last HD was the day before on Friday. Pt confirms dry weight was increased to 78 kg as requested by Dr. Shirlee Latch.   He has taken morning medications.   Pt says he completed amiodarone taper, but has been taking Amio 400 daily by mistake. . Labs from dialysis 01/11/23 reviewed by Dr Shirlee Latch. Patient does admit to Dr. Shirlee Latch that he has not been taking Mexilitine 150 twice daily due to nausea. He has been taking one tablet 3 - 4 x weekly. He will increase to 150 bid, take with food, and separate from amiodarone.   Pt states he saw Pulmonology, had ABG, DLCO as ordered. He does not want to do the sleep study as ordered. He does report slight improvement in difficulty breathing and cough (Tessalon).    Vital Signs:  Temp: 97.8 Doppler Pressure: 126 Automatc BP: 141/70 (101) HR: 71 SR SPO2: unable to obtaine   Weight: UTO lb w/o eqt Last weight: 167 lb pt reported from dialysis center (dry wt: 76 kg)   VAD Indication: Destination therapy- Pt is no longer candidate for heart/kidney transplant due to PAD per [facility].    VAD interrogation & Equipment Management: Speed: 5400 Flow: 3.7 Power: 4.3w    PI: 6.8   Alarms: none Events: 90 over last two days  Fixed speed: 5400 Low speed limit: 5100   Primary Controller: Replace back up battery in 29 months Back up controller:   Replace back up battery in 11 months   Annual Equipment Maintenance on UBC/PM was performed on 03/30/21.    I reviewed the LVAD parameters from today and compared the results to the patient's prior recorded data. LVAD interrogation was NEGATIVE for  significant power changes, NEGATIVE for clinical alarms and STABLE for PI events/speed drops. No programming changes were made and pump is functioning within specified parameters. Pt is performing daily controller and system monitor self tests along with completing weekly and monthly maintenance for LVAD equipment.   LVAD equipment check completed and is in good working order. Back-up equipment present.   Exit Site Care: Drive line is being maintained daily by daughter using daily dressing and changing every three days, covering with tegaderm.  He is not wearing an anchor- refuses to wear. Existing VAD dressing removed and site care performed using sterile technique. Drive line exit site cleaned with Vashe and allowed to dry. Then site cleansed with Chlora prep applicators x 2, allowed to dry, then rinsed with saline, and allowed to dry. NO silver strip applied. VASHE solution moistened 2 x 2 placed at exit site.   No drainage noted on gauze. No redness, rash, pain or foul odor noted. Pt denies fever or chills. Continue every three day  dressing changes.  Pt provided with 7 daily kits and bottle of VASHE solution for home use  Significant Events on VAD Support:  -10/2016>> poss drive line infection, CT ABD neg, ID consult-doxy -11/09/16>> admit for poss drive line infection, IV abx -03/24/17>> doxy for poss drive line infection -05/04/55>> drive line debridement with wound-vac -06/2017>> drive line +proteus, IV abx, Bactrim x14 days   Device:  St jude Dual Therapies: on 200 bpm Last check: 01/21/23   BP & Labs: Doppler BP 126 -modified systolic. Has taken medications yet today.   Hgb 10.6 on HD labs 5/24- No S/S of bleeding. Specifically denies melena/BRBPR or nosebleeds.   LDH 222 on HD labs 10/19/22 - Denies tea-colored urine. No power elevations noted on interrogation.    Patient Instructions: Continue Amiodarone 400 mg daily. Take Mexilitine 150 mg twice daily with food. Take separately from  Amiodarone. Labs today. Will call you if abnormal. Return to VAD Clinic in 6 weeks. Keep your f/u appointment with Dr. Graciela Husbands (EP) on 01/31/23.    Speed  Flow  PI  Power  LVIDD  AI  Aortic opening MR  TR  Septum  RV  VTI (>18cm)  5400 3.7 6.8 4.3w 4.3 Mild 0/0 none trace Bow to left  Mild HK    5500  3.9 6.7 4.5w 4.6 mild 0/0 none Trace bow to left     5300  3.5 7.2 4.1 4.6 mild 0/0 none midline                                                Doppler MAP: 126  Auto cuff BP: 141/70 (101)   Ramp ECHO performed at bedside per Dr. Shirlee Latch  At completion of ramp study, patients primary controller programmed:  Fixed speed:  5300 Low speed limit: 5000  Hessie Diener RN, VAD Coordinator 24/7 pager [phone]    LVAD Clinic Note  Cardiology: Dr. Shirlee Latch  HPI:   Briscoe Burns is a 57 y.o. with history of CAD s/p anterior MI in 2010 and NSTEMI in 3/18 with DES to pRCA and mLAD and ischemic cardiomyopathy now s/p Heartmate 3 LVAD and ESRD presents for followup of CHF/LVAD.    Patient developed low output heart failure.  He was not deemed to be a good transplant candidate.  He was admitted in 10/18 and Heartmate 3 LVAD was placed.  He had baseline CKD stage 3 likely from diabetic nephropathy and CHF.  He developed peri-op AKI and ended up on dialysis.   Admitted 6/19 for emergent VAD exchange 03/01/18 in the setting of HM3 outflow graft kink. He required CVVHD post op, but was transtioned back to [facility] without problems.   Subsequently developed ischemic LEs and underwent sequential bilateral AKAs.   Patient had episode of VT in 2/23 with ICD shock.  He was started on amiodarone.  He had another ICD discharge with VF in 10/23 in the setting of significant emotional stress (grandson had died).   Patient presents for sick visit following ICD shock on 3/22 with his son. Denies issues with VAD equipment.   He is here for work-in visit with his son due to recent ICD shock and low flow alarms on  VAD.   Recently found to have multiple VT/VF episodes on remote interrogation. ATP paced out of several occurences. Contacted by EP and amio restarted at 400 BID (had been off)  Pt reports on Friday prior to ICD shock he was lightheaded and dizzy. Symptoms resolved after shock. Pt confirms that he is taking Amio 400 BID x 10 days, then switch to 400 mg daily. Also on Mexiletine 150 mg BID with a plan to stop when amio lowered back down per his report.   Pt confirms he is taking Cefadroxil three times a  week. Drive line site improved since last visit. Pt has repeated trauma to driveline as he refuses to wear anchor.    In 2/24 and 3/24, patient had episodes of VT with shocks.  Amiodarone was restarted.  He also had ramp echo with speed decreased to 5400.   On 01/20/22, patient had 12 episodes of VT, multiple ATP and 1 shock.  Patient was asleep while this was going on and it was not associated with HD.    Ramp echo was done today, speed decreased to 5300 rpm.  Flow stayed stable at about 3.6 L/min and septum was more midline (pulled left at 5400 rpm).  The aortic valve did not open.   Patient has had multiple PI events at HD which is typical for him, no low flow events.  No lightheadedness/dizziness. He can transfer from wheelchair to bed or chair without dyspnea. Feels like he is at his baseline.  Of noted, he has only been taking mexiletine 2-3 times/week as he says it gives him abdominal discomfort.    VAD Indication: Destination therapy- Pt is no longer candidate for heart/kidney transplant due to PAD per [facility].    VAD interrogation & Equipment Management: See LVAD nurse's note above.   PMH: 1. Chronic systolic CHF: Ischemic cardiomyopathy.  St Jude ICD.  - Echo (11/17) with EF 25-30%, moderate diastolic dysfunction, mild MR, mildly dilated RV with moderately decreased systolic function, peak RV-RA gradient 48 mmHg.  - Echo (3/18) with EF 25-30%, wall motion abnormalities, normal RV size and  systolic function.  - Admission 3/18 with cardiogenic shock and AKI, required milrinone gtt.  - Echo (8/18) with EF 20-25%, mild LVH, moderate LV dilation, mild MR.  - CPX (8/18) with peak VO2 12.4, VE/VCO2 slope 35, RER 1.05 => severe HR limitation.  - Heartmate 3 LVAD 10/18.  - Pump thrombosis in setting of HM3 outflow graft kink.  Pump exchange 6/19 for HM3.  2. CAD: Anterior MI 2005, had bifurcation stenting LAD/diagonal.  Repeat cath 2010 with patent stents.  - NSTEMI 3/18: LHC with 99% ulcerated lesion proximal RCA, 95% mid LAD => PCI with DES to proximal RCA and mid LAD.  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  - Lower extremity venous dopplers (8/18): chronic DVT - V/Q scan (8/18): Normal, no PE.  4. Crohns disease: History of colectomy.  5. GERD 6. Diabetic gastroparesis: Admission 11/18 with abdominal pain, nausea/vomiting.  - [facility] workup 2019: Gastric emptying study near normal.  Negative breath test (no evidence for small bowel bacterial overgrowth).  Electrogastrogram showed poor gastric accomodation/capacity, "bradygastria."  7. HTN 8. Hyperlipidemia 9. Type II diabetes  10. ESRD: Post-op LVAD surgery.  He has had failed AV fistula and AV graft, now dialyzing via catheter . 11. Carotid dopplers (8/18): mild bilateral stenosis.  12. PFTs (8/18): Mild restriction (no IDL on CT chest).  13. Lung nodules: CT chest 8/18 with small bilateral nodules.  14. PAD: ABIs with moderate bilateral disease in 8/18.  - Peripheral angiogram 03/18/19 showed 80% right SFA stenosis and occluded right PT.  He had stenting of SFA and PTCA of PT with restoration of flow.  However, he still needed amputation right 4th toe on 03/21/19 with intractable pain.  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  - Peripheral angiogram left leg: DES to left SFA, only 1 vessel runoff via severely diseased peroneal artery, unable to intervene. 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  - Left AKA 11/20.  15. Atrial flutter: In 11/18 associated with  nausea/vomiting from  gastroparesis.  16. VT: VT with ICD shock in 11/22.  - VT with ICD shock in 2/23, amiodarone started.  - VF with ICD shock 10/23 - VT with ICD shocks 2/24, 3/24. 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 90 tablet 3   atorvastatin (LIPITOR) 40 MG tablet Take 1 tablet (40 mg total) by mouth daily at 6 PM. 90 tablet 3   benzonatate (TESSALON) 100 MG capsule Take 2 capsules (200 mg total) by mouth 3 (three) times daily as needed for cough. 30 capsule 2   calcitRIOL (ROCALTROL) 0.5 MCG capsule Take 1 capsule (0.5 mcg total) by mouth every Monday, Wednesday, and Friday. 15 capsule 5   cefadroxil (DURICEF) 500 MG capsule Take 2 capsules (1,000 mg total) by mouth 3 (three) times a week. Take AFTER dialysis sessions 24 capsule 11   citalopram (CELEXA) 20 MG tablet Take 1 tablet (20 mg total) by mouth daily. 30 tablet 6   COLACE 100 MG capsule Take 100 mg by mouth daily as needed for moderate constipation.     fluticasone (FLONASE) 50 MCG/ACT nasal spray Place 1 spray into both nostrils daily. 16 g 11   hydrALAZINE (APRESOLINE) 25 MG tablet Take 1 tablet three times a day on NON-dialysis days only 270 tablet 3   insulin glargine (LANTUS) 100 UNIT/ML injection Inject 0.35 mLs (35 Units total) into the skin every morning. 3 mL 11   levocetirizine (XYZAL) 5 MG tablet Take 1 tablet (5 mg total) by mouth every evening. 30 tablet 5   LORazepam (ATIVAN) 0.5 MG tablet Take 1 tablet (0.5 mg total) by mouth every 12 (twelve) hours as needed (nausea). 60 tablet 0   metoCLOPramide (REGLAN) 10 MG tablet Take 1 tablet (10 mg total) by mouth 3 (three) times daily. (Patient taking differently: Take 10 mg by mouth 3 (three) times daily before meals.) 90 tablet 6   midodrine (PROAMATINE) 5 MG tablet Take 2 tablets (10 mg total) by mouth  every Monday, Wednesday, and Friday. Take 1 tablet before dialysis treatment 70 tablet 6   multivitamin (RENA-VIT) TABS tablet Take 1 tablet by mouth daily.     oxyCODONE-acetaminophen (PERCOCET) 7.5-325 MG tablet Take 1 tablet by mouth 3 (three) times daily as needed for pain.     pantoprazole (PROTONIX) 40 MG tablet Take 1 tablet (40 mg total) by mouth 2 (two) times daily before lunch and supper. 60 tablet 11   promethazine (PHENERGAN) 12.5 MG tablet TAKE 1 TABLET (12.5 MG TOTAL) BY MOUTH EVERY 6 (SIX) HOURS AS NEEDED FOR NAUSEA OR VOMITING. 30 tablet 3   sevelamer carbonate (RENVELA) 0.8 g PACK packet Take 0.8 g by mouth 3 (three) times daily with meals. 270 each 1   triamcinolone ointment (KENALOG) 0.1 % Apply 1 Application topically 2 (two) times daily. 30 g 0   warfarin (COUMADIN) 5 MG tablet TAKE 1 TABLET BY MOUTH EVERY DAY 30 tablet 11   amiodarone (PACERONE) 400 MG tablet TAKE 2TABS BY MOUTH TWICE DAILY X1WK,THEN DECREASE TO 1TAB TWICE A DAY X1WK,THEN RESUME A DOSE OF 1 TAB DAILY. 30 tablet 3   mexiletine (MEXITIL) 150 MG capsule Take 1 capsule (150 mg total) by mouth 2 (two) times daily. (Patient taking differently: Take 150 mg by mouth 2 (two) times daily. Patient taking 2 - 3 times a week due  to medication causing nausea) 60 capsule 6   ondansetron (ZOFRAN-ODT) 4 MG disintegrating tablet Take by mouth as needed. (Patient not taking: Reported on 01/24/2023)     No current facility-administered medications for this encounter.    Metformin and related   Review of systems complete and found to be negative unless listed in HPI.     Vital Signs:  BP (!) 141/70 Comment: MAP 101  Pulse 71   Temp 97.8 F (36.6 C)    Exam General: Well appearing this am. NAD.  HEENT: Normal. Neck: Supple, JVP 7-8 cm. Carotids OK.  Cardiac:  Mechanical heart sounds with LVAD hum present.  Lungs:  CTAB, normal effort.  Abdomen:  NT, ND, no HSM. No bruits or masses. +BS  LVAD exit site: Well-healed and  incorporated. Dressing dry and intact. No erythema or drainage. Stabilization device present and accurately applied. Driveline dressing changed daily per sterile technique. Extremities:  Warm and dry. No cyanosis, clubbing, rash, or edema. Bilateral AKA.  Neuro:  Alert & oriented x 3. Cranial nerves grossly intact. Moves all 4 extremities w/o difficulty. Affect pleasant    ASSESSMENT AND PLAN: 1. Frequent VT with ICD shock: He was started on amiodarone after 2/23 ICD shock. VF again with shock in 10/23 in setting of extreme stress.  Was transitioned off amiodarone to mexiletine, but amiodarone restarted with VT in 2/24, 3/24.  VT again 5/24 with shock.  Of note, only taking mexiletine 2-3 times/week and it causes abdominal discomfort.  - EP following, will see Dr. Graciela Husbands.  - No low flow events but interventricular septum was left-shifted.  LVAD speed decreased to 5300.  - Continue amiodarone at 400 mg daily for now.  Send LFTs and TSH.  - He will take mexiletine regularly 150 mg bid, he will take with food and keep separate from amiodarone to avoid GI upset.  2. Chronic systolic CHF: Ischemic cardiomyopathy.  St Jude ICD.  NSTEMI in March 2018 with DES to LAD and RCA, complicated by cardiogenic shock, low output requiring milrinone.  Echo 8/18 with EF 20-25%, moderate MR. CPX (8/18) with severe functional impairment due to HF.  He is s/p HM3 LVAD placement. He had pump thrombosis 6/19 due to [facility] outflow graft kinking.  He had pump exchange for another HM3.  LVAD parameters stable but he does have PI events and low flow events at times with HD. He takes midodrine pre-HD, but needs hydralazine on non-HD days.  MAP high today but has not had hydralazine today.  Ramp echo today with decrease in speed to 5300 rpm to keep septum midline; aortic valve does not open.  - Continue midodrine 10 mg pre-HD - Volume managed at dialysis, MWF.  Would be careful to avoid excess UF given LVAD. Has frequent PI events  chronically but no low flows.  - Continue ASA 81 and warfarin with INR goal 2-2.5.    - Continue hydralazine 25 mg tid on non-HD days. - He is not candidate for transplant now given extensive PAD with amputations.   3. ESRD: HD on M-W-F with midodrine pre-HD. Dry weight recently increased to prevent low flow/suction.  4.  CAD: NSTEMI in 3/18.  LHC with 99% ulcerated lesion proximal RCA with left to right collaterals, 95% mid LAD stenosis after mid LAD stent.  s/p PCI to RCA and LAD on 11/25/16.  - Continue statin and ASA.  5. h/o DVTs: Factor V Leiden heterozygote. - Anticoagulated. 6. Diabetic gastroparesis: He has seen gastroparesis specialist  at [facility]. Surprisingly, gastric emptying study was normal. However, electrogastrogram showed poor gastric accomodation/capacity. Therefore, small frequent meals recommended.  He has been doing much better with this, minimal symptoms.  - Continue reglan 10 mg tid/ac, now off Marinol.  - Continue Protonix bid.  - Limit narcotic use as much as possible.  7. HTN: Takes hydralazine 25 tid on non-HD days. MAP high today but has not had hydralazine.  - Take hydralazine as soon as you get home.  - Continue midodrine 10 mg pre-HD as above.  8. Chronic pain: Now seen in a pain clinic.  9. Atrial flutter: Paroxysmal.  Not recent recurrence 10. PAD/ischemic feet:  He is now s/p bilateral AKAs.  Able to wheel his chair and do transfers without dyspnea.  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  - Will continue cefadroxil long-term (three days/week after HD).  This will be followed by ID.  - site ok today  Marca Ancona MD 01/25/2023

## 2023-01-24 NOTE — Patient Instructions (Addendum)
Patient Instructions: Continue Amiodarone 400 mg daily. Take Mexilitine 150 mg twice daily with food. Take separately from Amiodarone. Return to VAD Clinic in 6 weeks. Keep your f/u appointment with Dr. Graciela Husbands (EP) on 01/31/23.

## 2023-01-27 ENCOUNTER — Other Ambulatory Visit (HOSPITAL_COMMUNITY): Payer: Self-pay | Admitting: Pharmacist

## 2023-01-27 ENCOUNTER — Other Ambulatory Visit (HOSPITAL_COMMUNITY): Payer: Self-pay

## 2023-01-27 ENCOUNTER — Inpatient Hospital Stay (HOSPITAL_COMMUNITY)
Admission: EM | Admit: 2023-01-27 | Discharge: 2023-01-30 | DRG: 308 | Disposition: A | Discharge status: Home / Self Care | Payer: Medicare Other | Attending: Internal Medicine | Admitting: Internal Medicine

## 2023-01-27 ENCOUNTER — Telehealth (HOSPITAL_COMMUNITY): Payer: Self-pay | Admitting: *Deleted

## 2023-01-27 ENCOUNTER — Telehealth: Payer: Self-pay

## 2023-01-27 ENCOUNTER — Other Ambulatory Visit: Payer: Self-pay

## 2023-01-27 DIAGNOSIS — Z89421 Acquired absence of other right toe(s): Secondary | ICD-10-CM

## 2023-01-27 DIAGNOSIS — E1143 Type 2 diabetes mellitus with diabetic autonomic (poly)neuropathy: Secondary | ICD-10-CM | POA: Diagnosis present

## 2023-01-27 DIAGNOSIS — E669 Obesity, unspecified: Secondary | ICD-10-CM | POA: Diagnosis present

## 2023-01-27 DIAGNOSIS — K3184 Gastroparesis: Secondary | ICD-10-CM | POA: Diagnosis present

## 2023-01-27 DIAGNOSIS — I5020 Unspecified systolic (congestive) heart failure: Secondary | ICD-10-CM | POA: Diagnosis not present

## 2023-01-27 DIAGNOSIS — Z794 Long term (current) use of insulin: Secondary | ICD-10-CM | POA: Diagnosis not present

## 2023-01-27 DIAGNOSIS — Z992 Dependence on renal dialysis: Secondary | ICD-10-CM

## 2023-01-27 DIAGNOSIS — N2581 Secondary hyperparathyroidism of renal origin: Secondary | ICD-10-CM | POA: Diagnosis present

## 2023-01-27 DIAGNOSIS — Z89611 Acquired absence of right leg above knee: Secondary | ICD-10-CM

## 2023-01-27 DIAGNOSIS — E1122 Type 2 diabetes mellitus with diabetic chronic kidney disease: Secondary | ICD-10-CM | POA: Diagnosis present

## 2023-01-27 DIAGNOSIS — Z79899 Other long term (current) drug therapy: Secondary | ICD-10-CM

## 2023-01-27 DIAGNOSIS — I4892 Unspecified atrial flutter: Secondary | ICD-10-CM | POA: Diagnosis present

## 2023-01-27 DIAGNOSIS — I252 Old myocardial infarction: Secondary | ICD-10-CM

## 2023-01-27 DIAGNOSIS — Z8249 Family history of ischemic heart disease and other diseases of the circulatory system: Secondary | ICD-10-CM

## 2023-01-27 DIAGNOSIS — I2510 Atherosclerotic heart disease of native coronary artery without angina pectoris: Secondary | ICD-10-CM | POA: Diagnosis present

## 2023-01-27 DIAGNOSIS — E7800 Pure hypercholesterolemia, unspecified: Secondary | ICD-10-CM | POA: Diagnosis present

## 2023-01-27 DIAGNOSIS — I132 Hypertensive heart and chronic kidney disease with heart failure and with stage 5 chronic kidney disease, or end stage renal disease: Secondary | ICD-10-CM | POA: Diagnosis present

## 2023-01-27 DIAGNOSIS — G8929 Other chronic pain: Secondary | ICD-10-CM | POA: Diagnosis present

## 2023-01-27 DIAGNOSIS — Z89612 Acquired absence of left leg above knee: Secondary | ICD-10-CM | POA: Diagnosis not present

## 2023-01-27 DIAGNOSIS — Z7982 Long term (current) use of aspirin: Secondary | ICD-10-CM

## 2023-01-27 DIAGNOSIS — Z833 Family history of diabetes mellitus: Secondary | ICD-10-CM

## 2023-01-27 DIAGNOSIS — Z95811 Presence of heart assist device: Secondary | ICD-10-CM

## 2023-01-27 DIAGNOSIS — Z95810 Presence of automatic (implantable) cardiac defibrillator: Secondary | ICD-10-CM

## 2023-01-27 DIAGNOSIS — Z6826 Body mass index (BMI) 26.0-26.9, adult: Secondary | ICD-10-CM

## 2023-01-27 DIAGNOSIS — K219 Gastro-esophageal reflux disease without esophagitis: Secondary | ICD-10-CM | POA: Diagnosis present

## 2023-01-27 DIAGNOSIS — K5090 Crohn's disease, unspecified, without complications: Secondary | ICD-10-CM | POA: Diagnosis present

## 2023-01-27 DIAGNOSIS — I472 Ventricular tachycardia, unspecified: Secondary | ICD-10-CM | POA: Diagnosis present

## 2023-01-27 DIAGNOSIS — N186 End stage renal disease: Secondary | ICD-10-CM | POA: Diagnosis present

## 2023-01-27 DIAGNOSIS — D6851 Activated protein C resistance: Secondary | ICD-10-CM | POA: Diagnosis present

## 2023-01-27 DIAGNOSIS — I5022 Chronic systolic (congestive) heart failure: Secondary | ICD-10-CM | POA: Diagnosis present

## 2023-01-27 DIAGNOSIS — I480 Paroxysmal atrial fibrillation: Secondary | ICD-10-CM | POA: Diagnosis present

## 2023-01-27 DIAGNOSIS — Z86010 Personal history of colonic polyps: Secondary | ICD-10-CM

## 2023-01-27 DIAGNOSIS — Z888 Allergy status to other drugs, medicaments and biological substances status: Secondary | ICD-10-CM

## 2023-01-27 DIAGNOSIS — I255 Ischemic cardiomyopathy: Secondary | ICD-10-CM | POA: Diagnosis present

## 2023-01-27 DIAGNOSIS — Z7901 Long term (current) use of anticoagulants: Secondary | ICD-10-CM

## 2023-01-27 DIAGNOSIS — Z89412 Acquired absence of left great toe: Secondary | ICD-10-CM

## 2023-01-27 DIAGNOSIS — I4901 Ventricular fibrillation: Secondary | ICD-10-CM | POA: Diagnosis present

## 2023-01-27 DIAGNOSIS — Z955 Presence of coronary angioplasty implant and graft: Secondary | ICD-10-CM

## 2023-01-27 DIAGNOSIS — Z83719 Family history of colon polyps, unspecified: Secondary | ICD-10-CM

## 2023-01-27 DIAGNOSIS — Z9049 Acquired absence of other specified parts of digestive tract: Secondary | ICD-10-CM

## 2023-01-27 DIAGNOSIS — Z86718 Personal history of other venous thrombosis and embolism: Secondary | ICD-10-CM

## 2023-01-27 LAB — MAGNESIUM: Magnesium: 2 mg/dL (ref 1.7–2.4)

## 2023-01-27 LAB — CBC
HCT: 31.3 % — ABNORMAL LOW (ref 39.0–52.0)
Hemoglobin: 10 g/dL — ABNORMAL LOW (ref 13.0–17.0)
MCH: 27.9 pg (ref 26.0–34.0)
MCHC: 31.9 g/dL (ref 30.0–36.0)
MCV: 87.2 fL (ref 80.0–100.0)
Platelets: 127 10*3/uL — ABNORMAL LOW (ref 150–400)
RBC: 3.59 MIL/uL — ABNORMAL LOW (ref 4.22–5.81)
RDW: 17.1 % — ABNORMAL HIGH (ref 11.5–15.5)
WBC: 5.5 10*3/uL (ref 4.0–10.5)
nRBC: 0 % (ref 0.0–0.2)

## 2023-01-27 LAB — COMPREHENSIVE METABOLIC PANEL
ALT: 32 U/L (ref 0–44)
AST: 51 U/L — ABNORMAL HIGH (ref 15–41)
Albumin: 3.2 g/dL — ABNORMAL LOW (ref 3.5–5.0)
Alkaline Phosphatase: 92 U/L (ref 38–126)
Anion gap: 11 (ref 5–15)
BUN: 11 mg/dL (ref 6–20)
CO2: 29 mmol/L (ref 22–32)
Calcium: 8.3 mg/dL — ABNORMAL LOW (ref 8.9–10.3)
Chloride: 95 mmol/L — ABNORMAL LOW (ref 98–111)
Creatinine, Ser: 3.8 mg/dL — ABNORMAL HIGH (ref 0.61–1.24)
GFR, Estimated: 18 mL/min — ABNORMAL LOW (ref >60)
Glucose, Bld: 54 mg/dL — ABNORMAL LOW (ref 70–99)
Potassium: 3.3 mmol/L — ABNORMAL LOW (ref 3.5–5.1)
Sodium: 135 mmol/L (ref 135–145)
Total Bilirubin: 0.8 mg/dL (ref 0.3–1.2)
Total Protein: 7.4 g/dL (ref 6.5–8.1)

## 2023-01-27 LAB — LACTATE DEHYDROGENASE: LDH: 225 U/L — ABNORMAL HIGH (ref 98–192)

## 2023-01-27 LAB — PROTIME-INR
INR: 3.2 — ABNORMAL HIGH (ref 0.8–1.2)
Prothrombin Time: 32.6 seconds — ABNORMAL HIGH (ref 11.4–15.2)

## 2023-01-27 LAB — GLUCOSE, CAPILLARY: Glucose-Capillary: 158 mg/dL — ABNORMAL HIGH (ref 70–99)

## 2023-01-27 LAB — HIV ANTIBODY (ROUTINE TESTING W REFLEX): HIV Screen 4th Generation wRfx: NONREACTIVE

## 2023-01-27 MED ORDER — PANTOPRAZOLE SODIUM 40 MG PO TBEC
40.0000 mg | DELAYED_RELEASE_TABLET | Freq: Two times a day (BID) | ORAL | Status: DC
  Administered 2023-01-27 – 2023-01-30 (×6): 40 mg via ORAL
  Filled 2023-01-27 (×6): qty 1

## 2023-01-27 MED ORDER — INSULIN ASPART 100 UNIT/ML IJ SOLN
0.0000 [IU] | Freq: Three times a day (TID) | INTRAMUSCULAR | Status: DC
  Administered 2023-01-27 – 2023-01-28 (×2): 2 [IU] via SUBCUTANEOUS
  Administered 2023-01-28: 1 [IU] via SUBCUTANEOUS

## 2023-01-27 MED ORDER — DOCUSATE SODIUM 100 MG PO CAPS: 100.0000 mg | ORAL_CAPSULE | Freq: Every day | ORAL | Status: DC | PRN

## 2023-01-27 MED ORDER — SODIUM CHLORIDE 0.9% FLUSH: 3.0000 mL | INTRAVENOUS | Status: DC | PRN

## 2023-01-27 MED ORDER — QUINIDINE SULFATE 200 MG PO TABS
200.0000 mg | ORAL_TABLET | Freq: Two times a day (BID) | ORAL | Status: DC
  Filled 2023-01-27 (×2): qty 1

## 2023-01-27 MED ORDER — LEVOCETIRIZINE DIHYDROCHLORIDE 5 MG PO TABS: 5.0000 mg | ORAL_TABLET | Freq: Every evening | ORAL | Status: DC

## 2023-01-27 MED ORDER — RENA-VITE PO TABS
1.0000 | ORAL_TABLET | Freq: Every day | ORAL | Status: DC
  Administered 2023-01-27 – 2023-01-30 (×4): 1 via ORAL
  Filled 2023-01-27 (×4): qty 1

## 2023-01-27 MED ORDER — ONDANSETRON 4 MG PO TBDP: 4.0000 mg | ORAL_TABLET | ORAL | Status: DC | PRN

## 2023-01-27 MED ORDER — CITALOPRAM HYDROBROMIDE 20 MG PO TABS
20.0000 mg | ORAL_TABLET | Freq: Every day | ORAL | Status: DC
  Administered 2023-01-27 – 2023-01-30 (×4): 20 mg via ORAL
  Filled 2023-01-27 (×4): qty 1

## 2023-01-27 MED ORDER — POTASSIUM CHLORIDE CRYS ER 20 MEQ PO TBCR
40.0000 meq | EXTENDED_RELEASE_TABLET | Freq: Once | ORAL | Status: AC
  Administered 2023-01-27: 40 meq via ORAL
  Filled 2023-01-27: qty 2

## 2023-01-27 MED ORDER — ONDANSETRON HCL 4 MG/2ML IJ SOLN: 4.0000 mg | Freq: Four times a day (QID) | INTRAMUSCULAR | Status: DC | PRN

## 2023-01-27 MED ORDER — QUINIDINE SULFATE 200 MG PO TABS
200.0000 mg | ORAL_TABLET | Freq: Two times a day (BID) | ORAL | Status: DC
  Administered 2023-01-27 – 2023-01-30 (×7): 200 mg via ORAL
  Filled 2023-01-27 (×8): qty 1

## 2023-01-27 MED ORDER — ASPIRIN 81 MG PO TBEC
81.0000 mg | DELAYED_RELEASE_TABLET | Freq: Every day | ORAL | Status: DC
  Administered 2023-01-27 – 2023-01-30 (×4): 81 mg via ORAL
  Filled 2023-01-27 (×4): qty 1

## 2023-01-27 MED ORDER — ATORVASTATIN CALCIUM 40 MG PO TABS
40.0000 mg | ORAL_TABLET | Freq: Every day | ORAL | Status: DC
  Administered 2023-01-27 – 2023-01-29 (×3): 40 mg via ORAL
  Filled 2023-01-27 (×3): qty 1

## 2023-01-27 MED ORDER — SODIUM CHLORIDE 0.9 % IV SOLN: 250.0000 mL | INTRAVENOUS | Status: DC | PRN

## 2023-01-27 MED ORDER — CHLORHEXIDINE GLUCONATE CLOTH 2 % EX PADS
6.0000 | MEDICATED_PAD | Freq: Every day | CUTANEOUS | Status: DC
  Administered 2023-01-27 – 2023-01-30 (×4): 6 via TOPICAL

## 2023-01-27 MED ORDER — CALCITRIOL 0.25 MCG PO CAPS
0.5000 ug | ORAL_CAPSULE | ORAL | Status: DC
  Administered 2023-01-27: 0.5 ug via ORAL
  Filled 2023-01-27: qty 2

## 2023-01-27 MED ORDER — BENZONATATE 100 MG PO CAPS: 200.0000 mg | ORAL_CAPSULE | Freq: Three times a day (TID) | ORAL | Status: DC | PRN

## 2023-01-27 MED ORDER — LORATADINE 10 MG PO TABS
10.0000 mg | ORAL_TABLET | Freq: Every day | ORAL | Status: DC
  Administered 2023-01-27 – 2023-01-28 (×2): 10 mg via ORAL
  Filled 2023-01-27 (×3): qty 1

## 2023-01-27 MED ORDER — ACETAMINOPHEN 325 MG PO TABS: 650.0000 mg | ORAL_TABLET | ORAL | Status: DC | PRN

## 2023-01-27 MED ORDER — SODIUM CHLORIDE 0.9% FLUSH
3.0000 mL | Freq: Two times a day (BID) | INTRAVENOUS | Status: DC
  Administered 2023-01-27 – 2023-01-30 (×5): 3 mL via INTRAVENOUS

## 2023-01-27 MED ORDER — QUINIDINE SULFATE 200 MG PO TABS
200.0000 mg | ORAL_TABLET | Freq: Two times a day (BID) | ORAL | 1 refills | Status: DC
  Filled 2023-01-27: qty 12, 6d supply, fill #0
  Filled 2023-01-27 – 2023-01-31 (×3): qty 180, 90d supply, fill #0

## 2023-01-27 MED ORDER — AMIODARONE HCL 400 MG PO TABS
ORAL_TABLET | ORAL | 6 refills | Status: DC
Start: 2023-01-27 — End: 2023-01-30

## 2023-01-27 MED ORDER — AMIODARONE HCL 200 MG PO TABS
400.0000 mg | ORAL_TABLET | Freq: Two times a day (BID) | ORAL | Status: DC
  Administered 2023-01-27 – 2023-01-30 (×7): 400 mg via ORAL
  Filled 2023-01-27 (×7): qty 2

## 2023-01-27 MED ORDER — FLUTICASONE PROPIONATE 50 MCG/ACT NA SUSP
1.0000 | Freq: Every day | NASAL | Status: DC
  Administered 2023-01-30: 1 via NASAL
  Filled 2023-01-27: qty 16

## 2023-01-27 MED ORDER — CEFADROXIL 500 MG PO CAPS
1000.0000 mg | ORAL_CAPSULE | ORAL | Status: DC
  Administered 2023-01-27: 1000 mg via ORAL
  Filled 2023-01-27 (×2): qty 2

## 2023-01-27 MED ORDER — MIDODRINE HCL 5 MG PO TABS
10.0000 mg | ORAL_TABLET | ORAL | Status: DC
  Administered 2023-01-30: 10 mg via ORAL
  Filled 2023-01-27 (×2): qty 2

## 2023-01-27 MED ORDER — METOCLOPRAMIDE HCL 10 MG PO TABS
10.0000 mg | ORAL_TABLET | Freq: Three times a day (TID) | ORAL | Status: DC
  Administered 2023-01-27 – 2023-01-30 (×9): 10 mg via ORAL
  Filled 2023-01-27 (×9): qty 1

## 2023-01-27 MED ORDER — QUINIDINE SULFATE 200 MG PO TABS
200.0000 mg | ORAL_TABLET | Freq: Two times a day (BID) | ORAL | 0 refills | Status: DC
  Filled 2023-01-27 (×2): qty 12, 6d supply, fill #0

## 2023-01-27 NOTE — H&P (Addendum)
Advanced Heart Failure VAD History and Physical Note   PCP-Cardiologist: None  HF/VAD: Dr Shirlee Latch  Ep : Dr Graciela Husbands  Reason for Admission: VT HPI:    [name] is a 57 y.o. with history of CAD s/p anterior MI in 2010 and NSTEMI in 3/18 with DES to pRCA and mLAD and ischemic ardiomyopathy now s/p Heartmate 3 LVAD and ESRD  VT- 10/2021., 10/2022, 11/2022, 01/2023 VF 06/2022    On 01/20/22, patient had 12 episodes of VT, multiple ATP and 1 shock.  Patient was asleep while this was going on and it was not associated with HD. Saw Dr Shirlee Latch 01/24/23 and he only been taking mexiletine 2-3 times a week. Intolerant mexiletine due to nausea. Dr Shirlee Latch instructed to take mexiletine 150 mg twice a day.  .   Earlier today he was in HD and got shocked for VT. Unable to pull volume at HD. Had 3 low flows.   Device clinic called and reported multiple rounds of ATP. Refractory VT.   5/23 @ 19:54 sustained VT, rate 142, ATPx1 without resolution 19:55, sustained VT, rate 142, ATPx1 without resolution,  detection 141-199 19:56, sustained VT, rate 141, ATP x1 without resolution 19:57, sustained VT, rate 142, ATP x3, ATP x2 without resolution 19:59, sustained VT, rate 142, ATPx1 without resolution 20:00 sustained VT, rate 142, ATPx3, ATPx3 without resolution 20:01 sustained VT, ATPx3, ATPx3, rate 142, unsuccessful duration 1hr 21:18, sustained VT, ATPx3, rate 148, unsuccessful 21:19, sustained VT, ATPx3, ATPx3, rate 150, unsuccessful, duration 42sec 21:43, sustained VT, ATPx3, ATPx2, rate 151, unsuccessful 21:45, sustaiined VT, ATPx3, ATPx3, rate 150, duration 38sec 22:06, sustained VT, ATPx6, unsuccessful, therapies exhausted, rate 151     LVAD INTERROGATION:  HeartMate II LVAD:  Flow 3.2 liters/min, speed 5300, power 3.9, PI 2.7.  3 lows this am. 100+ PI events today.    Review of Systems: [y] = yes, [ ]  = no   General: Weight gain [ ] ; Weight loss [ ] ; Anorexia [ ] ; Fatigue [ ] ; Fever [  ]; Chills [ ] ; Weakness [ ]   Cardiac: Chest pain/pressure [ ] ; Resting SOB [ ] ; Exertional SOB [ ] ; Orthopnea [ ] ; Pedal Edema [ ] ; Palpitations [ ] ; Syncope [ ] ; Presyncope [ ] ; Paroxysmal nocturnal dyspnea[ ]   Pulmonary: Cough [ ] ; Wheezing[ ] ; Hemoptysis[ ] ; Sputum [ ] ; Snoring [ ]   GI: Vomiting[ ] ; Dysphagia[ ] ; Melena[ ] ; Hematochezia [ ] ; Heartburn[ ] ; Abdominal pain [ ] ; Constipation [ ] ; Diarrhea [ ] ; BRBPR [ ]   GU: Hematuria[ ] ; Dysuria [ ] ; Nocturia[ ]   Vascular: Pain in legs with walking [ ] ; Pain in feet with lying flat [ ] ; Non-healing sores [ ] ; Stroke [ ] ; TIA [ ] ; Slurred speech [ ] ;  Neuro: Headaches[ ] ; Vertigo[ ] ; Seizures[ ] ; Paresthesias[ ] ;Blurred vision [ ] ; Diplopia [ ] ; Vision changes [ ]   Ortho/Skin: Arthritis [ ] ; Joint pain [ Y]; Muscle pain [ ] ; Joint swelling [ ] ; Back Pain [ Y]; Rash [ ]   Psych: Depression[ ] ; Anxiety[ ]   Heme: Bleeding problems [ ] ; Clotting disorders [ ] ; Anemia [ ]   Endocrine: Diabetes [ Y]; Thyroid dysfunction[ ]     Home Medications Prior to Admission medications   Medication Sig Start Date End Date Taking? Authorizing Provider  amiodarone (PACERONE) 400 MG tablet Take 400 mg (1 tablet) twice a day x 1 week starting 01/27/23. Then decrease to 400 mg (1 tablet) daily. 01/27/23   Laurey Morale, MD  aspirin EC  81 MG tablet Take 1 tablet (81 mg total) by mouth daily. 08/20/18   Janetta Hora, PA-C  atorvastatin (LIPITOR) 40 MG tablet Take 1 tablet (40 mg total) by mouth daily at 6 PM. 11/20/20   Leeroy Bock, MD  benzonatate (TESSALON) 100 MG capsule Take 2 capsules (200 mg total) by mouth 3 (three) times daily as needed for cough. 11/23/22   Omar Person, MD  calcitRIOL (ROCALTROL) 0.5 MCG capsule Take 1 capsule (0.5 mcg total) by mouth every Monday, Wednesday, and Friday. 06/06/18   Alford Highland, NP  cefadroxil (DURICEF) 500 MG capsule Take 2 capsules (1,000 mg total) by mouth 3 (three) times a week. Take AFTER dialysis  sessions 06/13/22   Blanchard Kelch, NP  citalopram (CELEXA) 20 MG tablet Take 1 tablet (20 mg total) by mouth daily. 03/19/18   Alford Highland, NP  COLACE 100 MG capsule Take 100 mg by mouth daily as needed for moderate constipation. 07/30/19   [provider]  fluticasone (FLONASE) 50 MCG/ACT nasal spray Place 1 spray into both nostrils daily. 11/23/22   Omar Person, MD  hydrALAZINE (APRESOLINE) 25 MG tablet Take 1 tablet three times a day on NON-dialysis days only 04/26/22   Laurey Morale, MD  insulin glargine (LANTUS) 100 UNIT/ML injection Inject 0.35 mLs (35 Units total) into the skin every morning. 08/25/22   Bess Kinds, MD  levocetirizine (XYZAL) 5 MG tablet Take 1 tablet (5 mg total) by mouth every evening. 11/23/22   Omar Person, MD  LORazepam (ATIVAN) 0.5 MG tablet Take 1 tablet (0.5 mg total) by mouth every 12 (twelve) hours as needed (nausea). 02/26/20   [name] M, PA-C  metoCLOPramide (REGLAN) 10 MG tablet Take 1 tablet (10 mg total) by mouth 3 (three) times daily. Patient taking differently: Take 10 mg by mouth 3 (three) times daily before meals. 01/22/18   Alford Highland, NP  mexiletine (MEXITIL) 150 MG capsule Take 1 capsule (150 mg total) by mouth 2 (two) times daily. Patient taking differently: Take 150 mg by mouth 2 (two) times daily. Patient taking 2 - 3 times a week due to medication causing nausea 12/05/22   Herbie Saxon, MD  midodrine (PROAMATINE) 5 MG tablet Take 2 tablets (10 mg total) by mouth every Monday, Wednesday, and Friday. Take 1 tablet before dialysis treatment 09/07/22   Laurey Morale, MD  multivitamin (RENA-VIT) TABS tablet Take 1 tablet by mouth daily.    [provider]  ondansetron (ZOFRAN-ODT) 4 MG disintegrating tablet Take by mouth as needed. Patient not taking: Reported on 01/24/2023 03/19/18   [provider]  oxyCODONE-acetaminophen (PERCOCET) 7.5-325 MG tablet Take 1 tablet by mouth 3 (three) times  daily as needed for pain. 06/28/21   [provider]  pantoprazole (PROTONIX) 40 MG tablet Take 1 tablet (40 mg total) by mouth 2 (two) times daily before lunch and supper. 05/07/19   Barrett, Joline Salt, PA-C  promethazine (PHENERGAN) 12.5 MG tablet TAKE 1 TABLET (12.5 MG TOTAL) BY MOUTH EVERY 6 (SIX) HOURS AS NEEDED FOR NAUSEA OR VOMITING. 07/20/20   Laurey Morale, MD  sevelamer carbonate (RENVELA) 0.8 g PACK packet Take 0.8 g by mouth 3 (three) times daily with meals. 06/16/19   Bhagat, Sharrell Ku, PA  triamcinolone ointment (KENALOG) 0.1 % Apply 1 Application topically 2 (two) times daily. 08/25/22   Bess Kinds, MD  warfarin (COUMADIN) 5 MG tablet TAKE 1 TABLET BY MOUTH EVERY DAY 07/25/22  Laurey Morale, MD    Past Medical History: Past Medical History:  Diagnosis Date   Angina    ASCVD (arteriosclerotic cardiovascular disease)    , Anterior infarction 2005, LAD diagonal bifurcation intervention 03/2004   Automatic implantable cardiac defibrillator -St. Jude's        Benign neoplasm of colon    CHF (congestive heart failure) (HCC)    Chronic systolic heart failure (HCC)    Coronary artery disease     Widely patent previously placed stents in the left anterior    Crohn's disease (HCC)    Deep venous thrombosis (HCC)    Recurrent-on Coumadin   Dialysis patient (HCC)    Dyspnea    Gastroparesis    GERD (gastroesophageal reflux disease)    High cholesterol    Hyperlipidemia    Hypersomnolent    Previous diagnosis of narcolepsy   Hypertension, essential    Ischemic cardiomyopathy    Ejection fraction 15-20% catheterization 2010   Type II or unspecified type diabetes mellitus without mention of complication, not stated as uncontrolled    Unspecified gastritis and gastroduodenitis without mention of hemorrhage     Past Surgical History: Past Surgical History:  Procedure Laterality Date   A/V SHUNTOGRAM N/A 05/24/2018   Procedure: A/V SHUNTOGRAM;  Surgeon:  Cephus Shelling, MD;  Location: [facility] INVASIVE CV LAB;  Service: Cardiovascular;  Laterality: N/A;   ABDOMINAL AORTOGRAM W/LOWER EXTREMITY N/A 03/18/2019   Procedure: ABDOMINAL AORTOGRAM W/LOWER EXTREMITY;  Surgeon: Maeola Harman, MD;  Location: [facility] INVASIVE CV LAB;  Service: Cardiovascular;  Laterality: N/A;   AMPUTATION Right 03/21/2019   Procedure: AMPUTATION DIGIT RIGHT FOURTH TOE;  Surgeon: Maeola Harman, MD;  Location: [facility] OR;  Service: Vascular;  Laterality: Right;   AMPUTATION Left 05/29/2019   Procedure: AMPUTATION LEFT GREAT TOE;  Surgeon: Nada Libman, MD;  Location: [facility] OR;  Service: Vascular;  Laterality: Left;   AMPUTATION Right 06/02/2019   Procedure: ABOVE KNEE AMPUTATION;  Surgeon: Larina Earthly, MD;  Location: [facility] OR;  Service: Vascular;  Laterality: Right;   AMPUTATION Left 07/23/2019   Procedure: AMPUTATION ABOVE KNEE LEFT;  Surgeon: Sherren Kerns, MD;  Location: [facility] OR;  Service: Vascular;  Laterality: Left;   APPENDECTOMY     AV FISTULA PLACEMENT Right 06/26/2017   Procedure: ARTERIOVENOUS (AV) FISTULA CREATION VERSUS GRAFT RIGHT  ARM;  Surgeon: Fransisco Hertz, MD;  Location: [facility] OR;  Service: Vascular;  Laterality: Right;   AV FISTULA PLACEMENT Right 10/31/2017   Procedure: INSERTION OF ARTERIOVENOUS (AV) ARTEGRAFT,  RIGHT UPPER ARM;  Surgeon: Fransisco Hertz, MD;  Location: [facility] OR;  Service: Vascular;  Laterality: Right;   AV FISTULA PLACEMENT Left 05/29/2018   Procedure: ARTERIOVENOUS (AV) FISTULA CREATION  LEFT UPPER EXTREMITY;  Surgeon: Maeola Harman, MD;  Location: [facility] OR;  Service: Vascular;  Laterality: Left;   AV FISTULA PLACEMENT Left 08/09/2018   Procedure: INSERTION OF ARTERIOVENOUS (AV) GORE-TEX GRAFT LEFT UPPER ARM;  Surgeon: Maeola Harman, MD;  Location: [facility] OR;  Service: Vascular;  Laterality: Left;   CARDIAC DEFIBRILLATOR PLACEMENT  2010   St. Jude ICD   CENTRAL LINE INSERTION Right 05/24/2018   Procedure: CENTRAL  LINE INSERTION;  Surgeon: Cephus Shelling, MD;  Location: [facility] INVASIVE CV LAB;  Service: Cardiovascular;  Laterality: Right;   COLECTOMY  ~ 2003   "for Crohn's" ascending colon.     CORONARY STENT INTERVENTION N/A 11/25/2016   Procedure: Coronary Stent Intervention;  Surgeon: Marykay Lex, MD;  Location: [facility] INVASIVE CV LAB;  Service: Cardiovascular;  Laterality: N/A;   EP IMPLANTABLE DEVICE N/A 09/14/2016   Procedure: ICD Generator Changeout;  Surgeon: Duke Salvia, MD;  Location: [facility] INVASIVE CV LAB;  Service: Cardiovascular;  Laterality: N/A;   ESOPHAGOGASTRODUODENOSCOPY N/A 07/12/2017   Procedure: ESOPHAGOGASTRODUODENOSCOPY (EGD);  Surgeon: Beverley Fiedler, MD;  Location: [facility] ENDOSCOPY;  Service: Gastroenterology;  Laterality: N/A;  VAD pt so VAD team will need to accompany pt.    FETAL SURGERY FOR CONGENITAL HERNIA     ????? pt has no knowledge of this.[name]     FISTULOGRAM Left 08/09/2018   Procedure: FISTULOGRAM LEFT ARM;  Surgeon: Maeola Harman, MD;  Location: [facility] OR;  Service: Vascular;  Laterality: Left;   INGUINAL HERNIA REPAIR     INSERTION OF DIALYSIS CATHETER Right 06/26/2017   Procedure: INSERTION OF TUNNELED  DIALYSIS CATHETER RIGHT INTERNAL JUGULAR;  Surgeon: Fransisco Hertz, MD;  Location: [facility] OR;  Service: Vascular;  Laterality: Right;   INSERTION OF IMPLANTABLE LEFT VENTRICULAR ASSIST DEVICE N/A 06/12/2017   Procedure: INSERTION OF IMPLANTABLE LEFT VENTRICULAR ASSIST DEVICE-HEARTMATE 3;  Surgeon: Kerin Perna, MD;  Location: [facility] OR;  Service: Open Heart Surgery;  Laterality: N/A;  HM3 LVAD  CIRC ARREST  NITRIC OXIDE   INSERTION OF IMPLANTABLE LEFT VENTRICULAR ASSIST DEVICE N/A 02/28/2018   Procedure: REDO INSERTION OF IMPLANTABLE LEFT VENTRICULAR ASSIST DEVICE - HEARTMATE 3;  Surgeon: Kerin Perna, MD;  Location: [facility] OR;  Service: Open Heart Surgery;  Laterality: N/A;   IR FLUORO GUIDE CV LINE RIGHT  07/22/2019   IR REMOVAL TUN CV CATH W/O FL  02/26/2018   IR  THORACENTESIS ASP PLEURAL SPACE W/IMG GUIDE  06/28/2017   IR US GUIDE VASC ACCESS RIGHT  07/22/2019   LIGATION OF ARTERIOVENOUS  FISTULA Right 10/31/2017   Procedure: LIGATION OF BRACHIO-BASILIC VEIN TRANSPOSITION RIGHT ARM;  Surgeon: Fransisco Hertz, MD;  Location: [facility] OR;  Service: Vascular;  Laterality: Right;   LOWER EXTREMITY ANGIOGRAPHY  05/02/2019   Procedure: Lower Extremity Angiography;  Surgeon: Cephus Shelling, MD;  Location: [facility] INVASIVE CV LAB;  Service: Cardiovascular;;   MULTIPLE EXTRACTIONS WITH ALVEOLOPLASTY N/A 06/08/2017   Procedure: MULTIPLE EXTRACTION WITH ALVEOLOPLASTY AND PRE PROSTHETIC SURGERY AS NEEDED;  Surgeon: Charlynne Pander, DDS;  Location: [facility] OR;  Service: Oral Surgery;  Laterality: N/A;   PERIPHERAL VASCULAR BALLOON ANGIOPLASTY  03/18/2019   Procedure: PERIPHERAL VASCULAR BALLOON ANGIOPLASTY;  Surgeon: Maeola Harman, MD;  Location: [facility] INVASIVE CV LAB;  Service: Cardiovascular;;  RT PT   PERIPHERAL VASCULAR INTERVENTION  03/18/2019   Procedure: PERIPHERAL VASCULAR INTERVENTION;  Surgeon: Maeola Harman, MD;  Location: [facility] INVASIVE CV LAB;  Service: Cardiovascular;;  RT SFA   PERIPHERAL VASCULAR INTERVENTION  05/02/2019   Procedure: PERIPHERAL VASCULAR INTERVENTION;  Surgeon: Cephus Shelling, MD;  Location: [facility] INVASIVE CV LAB;  Service: Cardiovascular;;  Lt. SFA   RIGHT HEART CATH N/A 06/07/2017   Procedure: RIGHT HEART CATH;  Surgeon: Laurey Morale, MD;  Location: [facility] INVASIVE CV LAB;  Service: Cardiovascular;  Laterality: N/A;   RIGHT HEART CATH N/A 02/08/2018   Procedure: RIGHT HEART CATH;  Surgeon: Laurey Morale, MD;  Location: [facility] INVASIVE CV LAB;  Service: Cardiovascular;  Laterality: N/A;   RIGHT HEART CATH N/A 08/09/2018   Procedure: RIGHT HEART CATH;  Surgeon: Laurey Morale, MD;  Location: [facility] INVASIVE CV LAB;  Service: Cardiovascular;  Laterality: N/A;  RIGHT/LEFT HEART CATH AND CORONARY ANGIOGRAPHY N/A 11/23/2016   Procedure:  Right/Left Heart Cath and Coronary Angiography;  Surgeon: Laurey Morale, MD;  Location: [facility] INVASIVE CV LAB;  Service: Cardiovascular;  Laterality: N/A;   TEE WITHOUT CARDIOVERSION N/A 06/12/2017   Procedure: TRANSESOPHAGEAL ECHOCARDIOGRAM (TEE);  Surgeon: Donata Clay, Theron Arista, MD;  Location: [facility] OR;  Service: Open Heart Surgery;  Laterality: N/A;   TEE WITHOUT CARDIOVERSION N/A 02/28/2018   Procedure: TRANSESOPHAGEAL ECHOCARDIOGRAM (TEE);  Surgeon: Donata Clay, Theron Arista, MD;  Location: [facility] OR;  Service: Open Heart Surgery;  Laterality: N/A;   TEMPORARY DIALYSIS CATHETER Left 05/24/2018   Procedure: TEMPORARY DIALYSIS CATHETER;  Surgeon: Cephus Shelling, MD;  Location: [facility] INVASIVE CV LAB;  Service: Cardiovascular;  Laterality: Left;   THROMBECTOMY AND REVISION OF ARTERIOVENTOUS (AV) GORETEX  GRAFT Right 12/12/2017   Procedure: THROMBECTOMY ARTERIOVENTOUS (AV) GORETEX  GRAFT RIGHT UPPER ARM;  Surgeon: Fransisco Hertz, MD;  Location: [facility] OR;  Service: Vascular;  Laterality: Right;   TRANSMETATARSAL AMPUTATION Right 04/30/2019   Procedure: RIGHT TRANSMETATARSAL AMPUTATION;  Surgeon: Maeola Harman, MD;  Location: [facility] OR;  Service: Vascular;  Laterality: Right;    Family History: Family History  Problem Relation Age of Onset   Colon polyps Mother    Diabetes Mother    Heart attack Father    Diabetes Father    Stroke Paternal Grandfather    Colitis Sister    Heart disease Brother    Colitis Sister    Colon cancer Neg Hx     Social History: Social History   Socioeconomic History   Marital status: Married    Spouse name: Not on file   Number of children: Not on file   Years of education: Not on file   Highest education level: Not on file  Occupational History   Not on file  Tobacco Use   Smoking status: Never    Passive exposure: Past   Smokeless tobacco: Never  Vaping Use   Vaping Use: Never used  Substance and Sexual Activity   Alcohol use: No   Drug use: No   Sexual activity:  Never  Other Topics Concern   Not on file  Social History Narrative   Not on file   Social Determinants of Health   Financial Resource Strain: Low Risk  (12/06/2022)   Overall Financial Resource Strain (CARDIA)    Difficulty of Paying Living Expenses: Not hard at all  Food Insecurity: No Food Insecurity (12/06/2022)   Hunger Vital Sign    Worried About Running Out of Food in the Last Year: Never true    Ran Out of Food in the Last Year: Never true  Transportation Needs: No Transportation Needs (11/19/2019)   PRAPARE - Administrator, Civil Service (Medical): No    Lack of Transportation (Non-Medical): No  Physical Activity: Inactive (12/06/2022)   Exercise Vital Sign    Days of Exercise per Week: 0 days    Minutes of Exercise per Session: 0 min  Stress: No Stress Concern Present (12/06/2022)   [employer] of Occupational Health - Occupational Stress Questionnaire    Feeling of Stress : Not at all  Social Connections: Socially Integrated (12/06/2022)   Social Connection and Isolation Panel [NHANES]    Frequency of Communication with Friends and Family: More than three times a week    Frequency of Social Gatherings with Friends and Family: More than three times a week    Attends Religious Services: More than  4 times per year    Active Member of Clubs or Organizations: Yes    Attends Banker Meetings: More than 4 times per year    Marital Status: Married    Allergies:  Allergies  Allergen Reactions   Metformin And Related Diarrhea    Objective:    Vital Signs:   Temp:  [97.8 F (36.6 C)] 97.8 F (36.6 C) (05/24 1100) Pulse Rate:  [67] 67 (05/24 1100) Resp:  [16] 16 (05/24 1100) BP: (104)/(75) 104/75 (05/24 1100) SpO2:  [97 %] 97 % (05/24 1100)   There were no vitals filed for this visit.  Mean arterial Pressure - unable to take   Physical Exam    General:  . No resp difficulty HEENT: Normal Neck: supple. JVP 7-8 . Carotids 2+ bilat; no  bruits. No lymphadenopathy or thyromegaly appreciated. Cor: Mechanical heart sounds with LVAD hum present. Lungs: Clear Abdomen: soft, nontender, nondistended. No hepatosplenomegaly. No bruits or masses. Good bowel sounds. Driveline: C/D/I; securement device intact and driveline incorporated Extremities: no cyanosis, clubbing, rash, bilateral AKA Neuro: alert & orientedx3, cranial nerves grossly intact. moves all 4 extremities w/o difficulty. Affect pleasant   Telemetry   VT 170-180s   EKG   VT 183  Labs    Basic Metabolic Panel: No results for input(s): "NA", "K", "CL", "CO2", "GLUCOSE", "BUN", "CREATININE", "CALCIUM", "MG", "PHOS" in the last 168 hours.  Liver Function Tests: No results for input(s): "AST", "ALT", "ALKPHOS", "BILITOT", "PROT", "ALBUMIN" in the last 168 hours. No results for input(s): "LIPASE", "AMYLASE" in the last 168 hours. No results for input(s): "AMMONIA" in the last 168 hours.  CBC: No results for input(s): "WBC", "NEUTROABS", "HGB", "HCT", "MCV", "PLT" in the last 168 hours.  Cardiac Enzymes: No results for input(s): "CKTOTAL", "CKMB", "CKMBINDEX", "TROPONINI" in the last 168 hours.  BNP: BNP (last 3 results) No results for input(s): "BNP" in the last 8760 hours.  ProBNP (last 3 results) No results for input(s): "PROBNP" in the last 8760 hours.   CBG: No results for input(s): "GLUCAP" in the last 168 hours.  Coagulation Studies: No results for input(s): "LABPROT", "INR" in the last 72 hours.  Other results: EKG: .   Imaging    No results found.    Patient Profile:  [name] is a 57 y.o. 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  Presented to ED with VT  Assessment/Plan:    1. Refractory VT -VT with multiple ATPs over the last 24 hours.  - Appropriate shock today for  VT. Unsuccessful.  - Presented to ED in VT. 3 low  flows on VAD interrogation  - EP consulted . Device adjusted  - ATP in ED with conversion.  - Continue oral amiodarone  - Additional antiarrythmic per EP.   2. HFrEF. , HMIII, ICM  St Jude ICD.  NSTEMI in March 2018 with DES to LAD and RCA, complicated by cardiogenic shock, low output requiring milrinone.  Echo 8/18 with EF 20-25%, moderate MR. CPX (8/18) with severe functional impairment due to HF.  He is s/p HM3 LVAD placement. He had pump thrombosis 6/19 due to [facility] outflow graft kinking.  He had pump exchange for another HM3.  LVAD parameters stable but he does have PI events and low flow events at times with HD. He takes midodrine pre-HD, but needs hydralazine on non-HD days.  Had Ramp Ecvho 5/21. Speed decreased to 5300 Volume managed per HD  - Continue midodrine 10 mg tid.  - On coumdin.  - Check INR, CMET, CBC, LDH.   3. Driveline infection, chronic  Continue cefadroxil long term    4. ESRD  IHD. M-W-F  Will notify Nephrology.   5. PAD S/P AKA  6. Diabetic Gastroparesis He has seen gastroparesis specialist at [facility]. Surprisingly, gastric emptying study was normal. However, electrogastrogram showed poor gastric accomodation/capacity. Therefore, small frequent meals recommended.  He has been doing much better with this, minimal symptoms.  - Continue reglan 10 mg tid/ac, now off Marinol.  - Continue Protonix bid.  7. DMII Add SSI Check HGb A1C.    May need arterial line.  Admit to 2H. EP appreciated. Nephrology consulted.    I reviewed the LVAD parameters from today, and compared the results to the patient's prior recorded data.  No programming changes were made.  The LVAD is functioning within specified parameters.  The patient performs LVAD self-test daily.  LVAD interrogation was negative for any significant power changes, alarms or PI events/speed drops.  LVAD equipment check completed and is in good working order.  Back-up equipment present.   LVAD education done  on emergency procedures and precautions and reviewed exit site care.  Length of Stay: 0  Tonye Becket, NP 01/27/2023, 11:21 AM  VAD Team Pager [phone] (7am - 7am) +++VAD ISSUES ONLY+++   Advanced Heart Failure Team Pager [phone] (M-F; 7a - 5p)  Please contact [facility] for night-coverage after hours (5p -7a ) and weekends on [email or web address] for all non- LVAD Issues   Agree with above.   57 y/o male with  CAD, ESRD, DM2, severe systolic HF s/p VAD and recurrent VT  Has been struggling with recurrent VT despite amio and mexilitene. Remote device interrogation as outpatient showed persistent VT with failed ATP. Received ICD shock in HD today.   On arrival to ED in rapid VT but hemodynamically stable.   Seen by EP and able to pace out.   Denies CP, SOB. VAD parameters stable  General:  Sitting up in bed  HEENT: normal  Neck: supple. JVP not elevated.  + HD cath Carotids 2+ bilat; no bruits. No lymphadenopathy or thryomegaly appreciated. Cor: LVAD hum.  Lungs: Clear. Abdomen: obese soft, nontender, non-distended. No hepatosplenomegaly. No bruits or masses. Good bowel sounds. Driveline site clean. Anchor in place.  Extremities: no cyanosis, clubbing, rash. S/p B AKA Neuro: alert & oriented x 3. No focal deficits. Moves all 4 without problem   He has recurrent/refractory VT. Now paced out per EP.   Plan discussed with Drs Graciela Husbands and Lalla Brothers. Plant to start qunidine on top of amio. This will need to be done under close tele monitoring. Watch QT. Supp electrolytes.   Have notified [facility] of his admission.   CRITICAL CARE Performed by: Arvilla Meres  Total critical care time: 45 minutes  Critical care time was exclusive of separately billable procedures and treating other patients.  Critical care was necessary to treat or prevent imminent or life-threatening deterioration.  Critical care was time spent personally by me (independent of midlevel providers or residents) on the  following activities: development of treatment plan with patient and/or surrogate as well as nursing, discussions with consultants, evaluation of patient's response to treatment, examination of patient, obtaining history from patient or surrogate, ordering and performing treatments and interventions, ordering and review of laboratory studies, ordering and review of radiographic studies,  pulse oximetry and re-evaluation of patient's condition.  Arvilla Meres, MD  2:52 PM

## 2023-01-27 NOTE — Telephone Encounter (Signed)
Alert from [employer]  Device alert for VT with therapy Events occurred 5/23 between 19:54-22:06  5/23 @ 19:54 sustained VT, rate 142, ATPx1 without resolution 19:55, sustained VT, rate 142, ATPx1 without resolution,  detection 141-199 19:56, sustained VT, rate 141, ATP x1 without resolution 19:57, sustained VT, rate 142, ATP x3, ATP x2 without resolution 19:59, sustained VT, rate 142, ATPx1 without resolution 20:00 sustained VT, rate 142, ATPx3, ATPx3 without resolution 20:01 sustained VT, ATPx3, ATPx3, rate 142, unsuccessful duration 1hr 21:18, sustained VT, ATPx3, rate 148, unsuccessful 21:19, sustained VT, ATPx3, ATPx3, rate 150, unsuccessful, duration 42sec 21:43, sustained VT, ATPx3, ATPx2, rate 151, unsuccessful 21:45, sustaiined VT, ATPx3, ATPx3, rate 150, duration 38sec 22:06, sustained VT, ATPx6, unsuccessful, therapies exhausted, rate 151   Presenting rhythm sustained VT @ 0249AM   Patient called in to LVAD coordinator this am see phone note, patient is currently at dialysis.  Heart failure team aware of episodes

## 2023-01-27 NOTE — TOC Initial Note (Signed)
Transition of Care [facility]) - Initial/Assessment Note    Patient Details  Name: [name] MRN: [id number] Date of Birth: [birth year]  [facility] CM/SW Contact:    Elliot Cousin, RN Phone Number: [phone] 01/27/2023, 5:09 PM  Clinical Narrative:    HF TOC CM spoke to pt at bedside. States he lives at home with his adult children. He has hospital bed and wheelchair at home. States his son can provide transportation to home.                Expected Discharge Plan: Home/Self Care Barriers to Discharge: Continued Medical Work up   Patient Goals and CMS Choice Patient states their goals for this hospitalization and ongoing recovery are:: wants well enough to go home          Expected Discharge Plan and Services   Discharge Planning Services: CM Consult   Living arrangements for the past 2 months: Single Family Home                                      Prior Living Arrangements/Services Living arrangements for the past 2 months: Single Family Home Lives with:: Adult Children   Do you feel safe going back to the place where you live?: Yes      Need for Family Participation in Patient Care: Yes (Comment) Care giver support system in place?: Yes (comment) Current home services: DME (wheelchair, LVAD, scale, hospital bed) Criminal Activity/Legal Involvement Pertinent to Current Situation/Hospitalization: No - Comment as needed  Activities of Daily Living      Permission Sought/Granted Permission sought to share information with : Case Manager, Family Supports Permission granted to share information with : Yes, Verbal Permission Granted  Share Information with NAME: Luster Streetman     Permission granted to share info w Relationship: daughter  Permission granted to share info w Contact Information: [phone]  Emotional Assessment Appearance:: Developmentally appropriate Attitude/Demeanor/Rapport: Gracious, Engaged Affect  (typically observed): Accepting Orientation: : Oriented to Self, Oriented to Place, Oriented to  Time, Oriented to Situation   Psych Involvement: No (comment)  Admission diagnosis:  Ventricular tachycardia (HCC) [I47.20] VT (ventricular tachycardia) (HCC) [I47.20] Patient Active Problem List   Diagnosis Date Noted   VT (ventricular tachycardia) (HCC) 01/27/2023   Unspecified atrial flutter (HCC) 12/01/2022   Skin lesion of cheek 08/18/2022   Wheelchair dependence 08/06/2022   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HCC) 12/29/2021   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/24/2020   Penile ulcer 09/19/2019   PAD (peripheral artery disease) (HCC) 07/23/2019   Warfarin anticoagulation 04/15/2019   Heart failure (HCC) 05/23/2018   Benign essential HTN    Crohn's disease with complication (HCC)    Diabetes mellitus type 2 in nonobese (HCC)    Anemia of chronic disease    Sinus tachycardia    Factor V Leiden carrier (HCC) 01/10/2018   History of creation of ostomy (HCC) 01/10/2018   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) 07/10/2017   Chronic systolic CHF (congestive heart failure) (HCC) 06/07/2017   DNR (do not resuscitate) discussion    CHF exacerbation (HCC) 04/05/2017   Diabetic keto-acidosis (HCC) 04/05/2017   ESRD on dialysis (HCC) 01/17/2017   Non-ST elevation (NSTEMI) myocardial infarction [facility])    CHF (congestive heart failure) (HCC) 09/02/2016   SVT (supraventricular tachycardia) 04/26/2012  Gastroparesis 11/18/2011   Coronary artery disease    Ischemic cardiomyopathy    Essential hypertension    Automatic implantable cardioverter-defibrillator in situ    POLYP, COLON 09/25/2008   DIVERTICULOSIS, COLON 09/25/2008   History of Crohn's disease 09/05/2006   PCP:  Erick Alley, DO Pharmacy:   [facility] [phone] [place] Otto, [place] - [address] [place] [address] Phone:  [phone] Fax: [phone]  [facility] [address] [place] [address] Phone: [phone] Fax: [phone]  [facility] [address] [place] [address] Phone: [phone] Fax: [phone]     Social Determinants of Health (SDOH) Social History: SDOH Screenings   Food Insecurity: No Food Insecurity (12/06/2022)  Housing: Low Risk  (12/06/2022)  Transportation Needs: No Transportation Needs (11/19/2019)  Utilities: Not At Risk (12/06/2022)  Alcohol Screen: Low Risk  (12/06/2022)  Depression (PHQ2-9): Low Risk  (12/06/2022)  Financial Resource Strain: Low Risk  (12/06/2022)  Physical Activity: Inactive (12/06/2022)  Social Connections: Socially Integrated (12/06/2022)  Stress: No Stress Concern Present (12/06/2022)  Tobacco Use: Low Risk  (01/24/2023)   SDOH Interventions:     Readmission Risk Interventions     No data to display

## 2023-01-27 NOTE — Discharge Summary (Signed)
Advanced Heart Failure Team  Discharge Summary   Patient ID: [name] MRN: [id number], DOB/AGE: 58-Jul-1967 58 y.o. Admit date: 01/27/2023 D/C date:     01/30/2023   Primary Discharge Diagnoses:  Refractory VT HFrEF/HMIII Chronic Driveline Infection  ESRD  PAD Diabetic Gastroparesis  DMII   Hospital Course:   57 y.o. with history of CAD s/p anterior MI in 2010 and NSTEMI in 3/18 with DES to pRCA and mLAD and ischemic cardiomyopathy s/p Heartmate 3 LVAD in 2018 and ESRD on HD. He also had hx of ischemic lower extremities s/p bilateral AKAs.   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 Repeated trauma, refuses to wear anchor.   VT- 10/2021., 10/2022, 11/2022, 01/2023 VF 06/2022    On 01/21/23, patient had 12 episodes of VT, multiple ATP and 1 shock.  Saw Dr Shirlee Latch 01/24/23 and he only been taking mexiletine 2-3 times a week. Intolerant mexiletine due to nausea. Dr Shirlee Latch instructed to take mexiletine 150 mg twice a day.    On 05/24 he was in HD and received ICD unsuccessful shock for VT after he exhausted ATP therapies overnight. Unable to pull volume at HD. Had 3 low flows.   EP consulted and was able to ATP him out of VT with his device via more aggressive tachy scheme. He was admitted for further management. Placed on quinidine 200 mg twice a day and amio 200 mg twice a day. No further VT.   From VAD perspective, stable. INR followed by pharmacy. Coumadin held on the day discharge. Recheck INR tomorrow.   Nephrology consulted. HD on M-W-F. Had HD on the day of discharge.    He will continue to be followed closely in the VAD clinic.    LVAD Interrogation HM III:   Speed:   5300   Flow: 3.4       PI:  4.8     Power:   4    Back-up speed:     5300   Discharge Vitals: Blood pressure (!) 109/90, pulse 69, temperature 97.9 F (36.6 C), temperature source Oral, resp. rate 17, weight 82.1 kg, SpO2 100 %. Physical Exam: GENERAL: No acute distress. HEENT:  normal  NECK: Supple, JVP  5-6 .  2+ bilaterally, no bruits.  No lymphadenopathy or thyromegaly appreciated.   CARDIAC:  Mechanical heart sounds with LVAD hum present.  LUNGS:  Clear to auscultation bilaterally.  ABDOMEN:  Soft, round, nontender, positive bowel sounds x4.     LVAD exit site: Stabilization device present and accurately applied.  Driveline dressing is being changed daily per sterile technique. EXTREMITIES:  Warm and dry, no cyanosis, clubbing, rash or edema. R /L AKA  LUE AVG.   NEUROLOGIC:  Alert and oriented x 3.    No aphasia.  No dysarthria.  Affect pleasant.     Labs: Lab Results  Component Value Date   WBC 6.0 01/30/2023   HGB 9.3 (L) 01/30/2023   HCT 29.7 (L) 01/30/2023   MCV 87.9 01/30/2023   PLT 137 (L) 01/30/2023    Recent Labs  Lab 01/27/23 1124 01/28/23 0409 01/30/23 0106  NA 135   < > 133*  K 3.3*   < > 4.6  CL 95*   < > 95*  CO2 29   < > 24  BUN 11   < > 52*  CREATININE 3.80*   < > 9.23*  CALCIUM 8.3*   < > 9.0  PROT 7.4  --   --  BILITOT 0.8  --   --   ALKPHOS 92  --   --   ALT 32  --   --   AST 51*  --   --   GLUCOSE 54*   < > 110*   < > = values in this interval not displayed.   Lab Results  Component Value Date   CHOL 128 05/04/2017   HDL 50 05/04/2017   LDLCALC 54 05/04/2017   TRIG 118 05/04/2017   BNP (last 3 results) No results for input(s): "BNP" in the last 8760 hours.  ProBNP (last 3 results) No results for input(s): "PROBNP" in the last 8760 hours.   Diagnostic Studies/Procedures   No results found.  Discharge Medications   Allergies as of 01/30/2023       Reactions   Metformin And Related Diarrhea        Medication List     STOP taking these medications    mexiletine 150 MG capsule Commonly known as: MEXITIL       TAKE these medications    amiodarone 400 MG tablet Commonly known as: PACERONE Take 1 tablet (400 mg total) by mouth 2 (two) times daily. Take 400 mg (1 tablet) twice a day x 1 week  starting 01/27/23. Then decrease to 400 mg (1 tablet) daily. What changed:  how much to take how to take this when to take this   aspirin EC 81 MG tablet Take 1 tablet (81 mg total) by mouth daily.   atorvastatin 40 MG tablet Commonly known as: LIPITOR Take 1 tablet (40 mg total) by mouth daily at 6 PM.   calcitRIOL 0.5 MCG capsule Commonly known as: ROCALTROL Take 1 capsule (0.5 mcg total) by mouth every Monday, Wednesday, and Friday.   cefadroxil 500 MG capsule Commonly known as: DURICEF Take 2 capsules (1,000 mg total) by mouth 3 (three) times a week. Take AFTER dialysis sessions   citalopram 20 MG tablet Commonly known as: CELEXA Take 1 tablet (20 mg total) by mouth daily.   Colace 100 MG capsule Generic drug: docusate sodium Take 100 mg by mouth daily as needed for moderate constipation.   fluticasone 50 MCG/ACT nasal spray Commonly known as: FLONASE Place 1 spray into both nostrils daily.   hydrALAZINE 25 MG tablet Commonly known as: APRESOLINE Take 1 tablet three times a day on NON-dialysis days only   insulin glargine 100 UNIT/ML injection Commonly known as: LANTUS Inject 0.35 mLs (35 Units total) into the skin every morning.   levocetirizine 5 MG tablet Commonly known as: XYZAL Take 1 tablet (5 mg total) by mouth every evening.   LORazepam 0.5 MG tablet Commonly known as: ATIVAN Take 1 tablet (0.5 mg total) by mouth every 12 (twelve) hours as needed (nausea).   metoCLOPramide 10 MG tablet Commonly known as: REGLAN Take 1 tablet (10 mg total) by mouth 3 (three) times daily. What changed: when to take this   midodrine 5 MG tablet Commonly known as: PROAMATINE Take 2 tablets (10 mg total) by mouth every Monday, Wednesday, and Friday. Take 1 tablet before dialysis treatment   multivitamin Tabs tablet Take 1 tablet by mouth daily.   ondansetron 4 MG disintegrating tablet Commonly known as: ZOFRAN-ODT Take by mouth as needed.    oxyCODONE-acetaminophen 7.5-325 MG tablet Commonly known as: PERCOCET Take 1 tablet by mouth 3 (three) times daily as needed for pain.   pantoprazole 40 MG tablet Commonly known as: Protonix Take 1 tablet (40 mg total) by  mouth 2 (two) times daily before lunch and supper.   promethazine 12.5 MG tablet Commonly known as: PHENERGAN TAKE 1 TABLET (12.5 MG TOTAL) BY MOUTH EVERY 6 (SIX) HOURS AS NEEDED FOR NAUSEA OR VOMITING.   quiNIDine sulfate 200 MG tablet Take 1 tablet (200 mg total) by mouth 2 (two) times daily.   quiNIDine sulfate 200 MG tablet Take 1 tablet (200 mg total) by mouth in the morning and at bedtime.   sevelamer carbonate 0.8 g Pack packet Commonly known as: RENVELA Take 0.8 g by mouth 3 (three) times daily with meals.   triamcinolone ointment 0.1 % Commonly known as: KENALOG Apply 1 Application topically 2 (two) times daily.   warfarin 5 MG tablet Commonly known as: COUMADIN Take as directed. If you are unsure how to take this medication, talk to your nurse or doctor. Original instructions: Take 1 tablet (5 mg total) by mouth daily. Start taking on: Jan 31, 2023        Disposition   The patient will be discharged in stable condition to home. Discharge Instructions     Diet - low sodium heart healthy   Complete by: As directed    INR  Goal: 2 - 2.5   Complete by: As directed    Goal: 2 - 2.5   Increase activity slowly   Complete by: As directed    Speed Settings:   Complete by: As directed    Fixed 5300 RPM Low 5000 RPM          Duration of Discharge Encounter: Greater than 35 minutes   Signed, Shondale Quinley NP-C  01/30/2023, 10:06 AM

## 2023-01-27 NOTE — Consult Note (Signed)
ELECTROPHYSIOLOGY CONSULT NOTE    Patient ID: [name] MRN: [id number], DOB/AGE: [birth year] 57 y.o.  Admit date: 01/27/2023 Date of Consult: 01/27/2023  Primary Physician: Erick Alley, DO Primary Cardiologist: None  Electrophysiologist: Dr. Graciela Husbands   Referring Provider: Dr. Gala Romney   Patient Profile: [name] is a 56 y.o. male with a history of VT with h/o shocks on amiodarone and mexitil, CAD, ICM s/p HM3 LVAD, gastroparesis,  and ESRD who is being seen today for the evaluation of ICD shocks at the request of Dr. Gala Romney.  HPI:  [name] is a 57 y.o. male well known to both HF and EP teams.   Last seen by Dr. Graciela Husbands 12/05/2022, had multiple episodes of VT in the preceding months prompting prior addition of mexiletine and reloading of amiodarone.   Planned at that visit to continue Amio taper and mexitil, considering stopping mexitil if VT improved.   Seen in HF clinic 01/24/2023 with multiple ATPs and a shock on 5/18. Pt had been limiting oral mexitil due to nasuea, only taking once daily 3-4 times a week due to nausea.   Planned to resume mexitil at BID dosing.   Device clinic received alert this am with exhaused ATP therapies overnight, and around the same time pt called reporting shock during HD.  ATP were very  frequently unsuccessful and shock at 30J was NOT successful.   Pt presented to [facility] in [place] in 170-180s. EP called to bedside and we were able to ATP him out via his device with a more aggressive tachy scheme.   Pt reports they were unable to pull volume at HF and he also had 3 low flows.   Labs Cr, K, and Mg pending PLT  127* (05/24 1124) HGB  10.0* (05/24 1124) WBC 5.5 (05/24 1124)  .    Past Medical History:  Diagnosis Date   Angina    ASCVD (arteriosclerotic cardiovascular disease)    , Anterior infarction 2005, LAD diagonal bifurcation intervention 03/2004   Automatic implantable cardiac defibrillator -St. Jude's        Benign  neoplasm of colon    CHF (congestive heart failure) (HCC)    Chronic systolic heart failure (HCC)    Coronary artery disease     Widely patent previously placed stents in the left anterior    Crohn's disease (HCC)    Deep venous thrombosis (HCC)    Recurrent-on Coumadin   Dialysis patient (HCC)    Dyspnea    Gastroparesis    GERD (gastroesophageal reflux disease)    High cholesterol    Hyperlipidemia    Hypersomnolent    Previous diagnosis of narcolepsy   Hypertension, essential    Ischemic cardiomyopathy    Ejection fraction 15-20% catheterization 2010   Type II or unspecified type diabetes mellitus without mention of complication, not stated as uncontrolled    Unspecified gastritis and gastroduodenitis without mention of hemorrhage      Surgical History:  Past Surgical History:  Procedure Laterality Date   A/V SHUNTOGRAM N/A 05/24/2018   Procedure: A/V SHUNTOGRAM;  Surgeon: Cephus Shelling, MD;  Location: [facility] INVASIVE CV LAB;  Service: Cardiovascular;  Laterality: N/A;   ABDOMINAL AORTOGRAM W/LOWER EXTREMITY N/A 03/18/2019   Procedure: ABDOMINAL AORTOGRAM W/LOWER EXTREMITY;  Surgeon: Maeola Harman, MD;  Location: [facility] INVASIVE CV LAB;  Service: Cardiovascular;  Laterality: N/A;   AMPUTATION Right 03/21/2019   Procedure: AMPUTATION DIGIT RIGHT FOURTH TOE;  Surgeon: Maeola Harman, MD;  Location: [facility] OR;  Service: Vascular;  Laterality: Right;   AMPUTATION Left 05/29/2019   Procedure: AMPUTATION LEFT GREAT TOE;  Surgeon: Nada Libman, MD;  Location: [facility] OR;  Service: Vascular;  Laterality: Left;   AMPUTATION Right 06/02/2019   Procedure: ABOVE KNEE AMPUTATION;  Surgeon: Larina Earthly, MD;  Location: [facility] OR;  Service: Vascular;  Laterality: Right;   AMPUTATION Left 07/23/2019   Procedure: AMPUTATION ABOVE KNEE LEFT;  Surgeon: Sherren Kerns, MD;  Location: [facility] OR;  Service: Vascular;  Laterality: Left;   APPENDECTOMY     AV FISTULA PLACEMENT Right  06/26/2017   Procedure: ARTERIOVENOUS (AV) FISTULA CREATION VERSUS GRAFT RIGHT  ARM;  Surgeon: Fransisco Hertz, MD;  Location: [facility] OR;  Service: Vascular;  Laterality: Right;   AV FISTULA PLACEMENT Right 10/31/2017   Procedure: INSERTION OF ARTERIOVENOUS (AV) ARTEGRAFT,  RIGHT UPPER ARM;  Surgeon: Fransisco Hertz, MD;  Location: [facility] OR;  Service: Vascular;  Laterality: Right;   AV FISTULA PLACEMENT Left 05/29/2018   Procedure: ARTERIOVENOUS (AV) FISTULA CREATION  LEFT UPPER EXTREMITY;  Surgeon: Maeola Harman, MD;  Location: [facility] OR;  Service: Vascular;  Laterality: Left;   AV FISTULA PLACEMENT Left 08/09/2018   Procedure: INSERTION OF ARTERIOVENOUS (AV) GORE-TEX GRAFT LEFT UPPER ARM;  Surgeon: Maeola Harman, MD;  Location: [facility] OR;  Service: Vascular;  Laterality: Left;   CARDIAC DEFIBRILLATOR PLACEMENT  2010   St. Jude ICD   CENTRAL LINE INSERTION Right 05/24/2018   Procedure: CENTRAL LINE INSERTION;  Surgeon: Cephus Shelling, MD;  [facility]: [facility] INVAS[facility] CV LAB;  Service: Cardiovascular;  Laterality: Right;   COLECTOMY  ~ 2003   "for Crohn's" ascending colon.     CORONARY STENT INTERVENTION N/A 11/25/2016   Procedure: Coronary Stent Intervention;  Surgeon: Marykay Lex, MD;  Location: [facility] INVASIVE CV LAB;  Service: Cardiovascular;  Laterality: N/A;   EP IMPLANTABLE DEVICE N/A 09/14/2016   Procedure: ICD Generator Changeout;  Surgeon: Duke Salvia, MD;  Location: [facility] INVASIVE CV LAB;  Service: Cardiovascular;  Laterality: N/A;   ESOPHAGOGASTRODUODENOSCOPY N/A 07/12/2017   Procedure: ESOPHAGOGASTRODUODENOSCOPY (EGD);  Surgeon: Beverley Fiedler, MD;  Location: [facility] ENDOSCOPY;  Service: Gastroenterology;  Laterality: N/A;  VAD pt so VAD team will need to accompany pt.    FETAL SURGERY FOR CONGENITAL HERNIA     ????? pt has no knowledge of this.[name]     FISTULOGRAM Left 08/09/2018   Procedure: FISTULOGRAM LEFT ARM;  Surgeon: Maeola Harman, MD;  Location: [facility] OR;  Service: Vascular;   Laterality: Left;   INGUINAL HERNIA REPAIR     INSERTION OF DIALYSIS CATHETER Right 06/26/2017   Procedure: INSERTION OF TUNNELED  DIALYSIS CATHETER RIGHT INTERNAL JUGULAR;  Surgeon: Fransisco Hertz, MD;  [facility] OR;  Service: Vascular;  Laterality: Right;   INSERTION OF IMPLANTABLE LEFT VENTRICULAR ASSIST DEVICE N/A 06/12/2017   Procedure: INSERTION OF IMPLANTABLE LEFT VENTRICULAR ASSIST DEVICE-HEARTMATE 3;  Surgeon: Kerin Perna, MD;  Location: [facility] OR;  Service: Open Heart Surgery;  Laterality: N/A;  HM3 LVAD  CIRC ARREST  NITRIC OXIDE   INSERTION OF IMPLANTABLE LEFT VENTRICULAR ASSIST DEVICE N/A 02/28/2018   Procedure: REDO INSERTION OF IMPLANTABLE LEFT VENTRICULAR ASSIST DEVICE - HEARTMATE 3;  Surgeon: Kerin Perna, MD;  Location: [facility] OR;  Service: Open Heart Surgery;  Laterality: N/A;   IR FLUORO GUIDE CV LINE RIGHT  07/22/2019   IR REMOVAL TUN CV CATH W/O [facility]  02/26/2018  IR THORACENTESIS ASP PLEURAL SPACE W/IMG GUIDE  06/28/2017   IR US GUIDE VASC ACCESS RIGHT  07/22/2019   LIGATION OF ARTERIOVENOUS  FISTULA Right 10/31/2017   Procedure: LIGATION OF BRACHIO-BASILIC VEIN TRANSPOSITION RIGHT ARM;  Surgeon: Fransisco Hertz, MD;  Location: [facility] OR;  Service: Vascular;  Laterality: Right;   LOWER EXTREMITY ANGIOGRAPHY  05/02/2019   Procedure: Lower Extremity Angiography;  Surgeon: Cephus Shelling, MD;  Location: [facility] INVASIVE CV LAB;  Service: Cardiovascular;;   MULTIPLE EXTRACTIONS WITH ALVEOLOPLASTY N/A 06/08/2017   Procedure: MULTIPLE EXTRACTION WITH ALVEOLOPLASTY AND PRE PROSTHETIC SURGERY AS NEEDED;  Surgeon: Charlynne Pander, DDS;  Location: [facility] OR;  Service: Oral Surgery;  Laterality: N/A;   PERIPHERAL VASCULAR BALLOON ANGIOPLASTY  03/18/2019   Procedure: PERIPHERAL VASCULAR BALLOON ANGIOPLASTY;  Surgeon: Maeola Harman, MD;  Location: [facility] INVASIVE CV LAB;  Service: Cardiovascular;;  RT PT   PERIPHERAL VASCULAR INTERVENTION  03/18/2019   Procedure: PERIPHERAL VASCULAR  INTERVENTION;  Surgeon: Maeola Harman, MD;  Location: [facility] INVASIVE CV LAB;  Service: Cardiovascular;;  RT SFA   PERIPHERAL VASCULAR INTERVENTION  05/02/2019   Procedure: PERIPHERAL VASCULAR INTERVENTION;  Surgeon: Cephus Shelling, MD;  Location: [facility] INVASIVE CV LAB;  Service: Cardiovascular;;  Lt. SFA   RIGHT HEART CATH N/A 06/07/2017   Procedure: RIGHT HEART CATH;  Surgeon: Laurey Morale, MD;  Location: [facility] INVASIVE CV LAB;  Service: Cardiovascular;  Laterality: N/A;   RIGHT HEART CATH N/A 02/08/2018   Procedure: RIGHT HEART CATH;  Surgeon: Laurey Morale, MD;  Location: [facility] INVASIVE CV LAB;  Service: Cardiovascular;  Laterality: N/A;   RIGHT HEART CATH N/A 08/09/2018   Procedure: RIGHT HEART CATH;  Surgeon: Laurey Morale, MD;  Location: [facility] INVASIVE CV LAB;  Service: Cardiovascular;  Laterality: N/A;   RIGHT/LEFT HEART CATH AND CORONARY ANGIOGRAPHY N/A 11/23/2016   Procedure: Right/Left Heart Cath and Coronary Angiography;  Surgeon: Laurey Morale, MD;  Location: [facility] INVASIVE CV LAB;  Service: Cardiovascular;  Laterality: N/A;   TEE WITHOUT CARDIOVERSION N/A 06/12/2017   Procedure: TRANSESOPHAGEAL ECHOCARDIOGRAM (TEE);  Surgeon: Donata Clay, Theron Arista, MD;  Location: [facility] OR;  Service: Open Heart Surgery;  Laterality: N/A;   TEE WITHOUT CARDIOVERSION N/A 02/28/2018   Procedure: TRANSESOPHAGEAL ECHOCARDIOGRAM (TEE);  Surgeon: Donata Clay, Theron Arista, MD;  Location: [facility] OR;  Service: Open Heart Surgery;  Laterality: N/A;   TEMPORARY DIALYSIS CATHETER Left 05/24/2018   Procedure: TEMPORARY DIALYSIS CATHETER;  Surgeon: Cephus Shelling, MD;  [facility]: [facility] INVASIVE CV LAB;  Service: Cardiovascular;  Laterality: Left;   THROMBECTOMY AND REVISION OF ARTERIOVENTOUS (AV) GORETEX  GRAFT Right 12/12/2017   Procedure: THROMBECTOMY ARTERIOVENTOUS (AV) GORETEX  GRAFT RIGHT UPPER ARM;  Surgeon: Fransisco Hertz, MD;  Location: [facility] OR;  Service: Vascular;  Laterality: Right;   TRANSMETATARSAL AMPUTATION Right 04/30/2019    Procedure: RIGHT TRANSMETATARSAL AMPUTATION;  Surgeon: Maeola Harman, MD;  Location: [facility] OR;  Service: Vascular;  Laterality: Right;     Medications Prior to Admission  Medication Sig Dispense Refill Last Dose   amiodarone (PACERONE) 400 MG tablet Take 400 mg (1 tablet) twice a day x 1 week starting 01/27/23. Then decrease to 400 mg (1 tablet) daily. 35 tablet 6    aspirin EC 81 MG tablet Take 1 tablet (81 mg total) by mouth daily. 90 tablet 3    atorvastatin (LIPITOR) 40 MG tablet Take 1 tablet (40 mg total) by mouth daily at 6 PM. 90 tablet 3  benzonatate (TESSALON) 100 MG capsule Take 2 capsules (200 mg total) by mouth 3 (three) times daily as needed for cough. 30 capsule 2    calcitRIOL (ROCALTROL) 0.5 MCG capsule Take 1 capsule (0.5 mcg total) by mouth every Monday, Wednesday, and Friday. 15 capsule 5    cefadroxil (DURICEF) 500 MG capsule Take 2 capsules (1,000 mg total) by mouth 3 (three) times a week. Take AFTER dialysis sessions 24 capsule 11    citalopram (CELEXA) 20 MG tablet Take 1 tablet (20 mg total) by mouth daily. 30 tablet 6    COLACE 100 MG capsule Take 100 mg by mouth daily as needed for moderate constipation.      fluticasone (FLONASE) 50 MCG/ACT nasal spray Place 1 spray into both nostrils daily. 16 g 11    hydrALAZINE (APRESOLINE) 25 MG tablet Take 1 tablet three times a day on NON-dialysis days only 270 tablet 3    insulin glargine (LANTUS) 100 UNIT/ML injection Inject 0.35 mLs (35 Units total) into the skin every morning. 3 mL 11    levocetirizine (XYZAL) 5 MG tablet Take 1 tablet (5 mg total) by mouth every evening. 30 tablet 5    LORazepam (ATIVAN) 0.5 MG tablet Take 1 tablet (0.5 mg total) by mouth every 12 (twelve) hours as needed (nausea). 60 tablet 0    metoCLOPramide (REGLAN) 10 MG tablet Take 1 tablet (10 mg total) by mouth 3 (three) times daily. (Patient taking differently: Take 10 mg by mouth 3 (three) times daily before meals.) 90 tablet 6     mexiletine (MEXITIL) 150 MG capsule Take 1 capsule (150 mg total) by mouth 2 (two) times daily. (Patient taking differently: Take 150 mg by mouth 2 (two) times daily. Patient taking 2 - 3 times a week due to medication causing nausea) 60 capsule 6    midodrine (PROAMATINE) 5 MG tablet Take 2 tablets (10 mg total) by mouth every Monday, Wednesday, and Friday. Take 1 tablet before dialysis treatment 70 tablet 6    multivitamin (RENA-VIT) TABS tablet Take 1 tablet by mouth daily.      ondansetron (ZOFRAN-ODT) 4 MG disintegrating tablet Take by mouth as needed. (Patient not taking: Reported on 01/24/2023)      oxyCODONE-acetaminophen (PERCOCET) 7.5-325 MG tablet Take 1 tablet by mouth 3 (three) times daily as needed for pain.      pantoprazole (PROTONIX) 40 MG tablet Take 1 tablet (40 mg total) by mouth 2 (two) times daily before lunch and supper. 60 tablet 11    promethazine (PHENERGAN) 12.5 MG tablet TAKE 1 TABLET (12.5 MG TOTAL) BY MOUTH EVERY 6 (SIX) HOURS AS NEEDED FOR NAUSEA OR VOMITING. 30 tablet 3    sevelamer carbonate (RENVELA) 0.8 g PACK packet Take 0.8 g by mouth 3 (three) times daily with meals. 270 each 1    triamcinolone ointment (KENALOG) 0.1 % Apply 1 Application topically 2 (two) times daily. 30 g 0    warfarin (COUMADIN) 5 MG tablet TAKE 1 TABLET BY MOUTH EVERY DAY 30 tablet 11     Inpatient Medications:   aspirin EC  81 mg Oral Daily   atorvastatin  40 mg Oral q1800   calcitRIOL  0.5 mcg Oral Q M,W,F   cefadroxil  1,000 mg Oral Once per day on Mon Wed Fri   citalopram  20 mg Oral Daily   fluticasone  1 spray Each Nare Daily   levocetirizine  5 mg Oral QPM   metoCLOPramide  10 mg Oral TID  midodrine  10 mg Oral Q M,W,F   multivitamin  1 tablet Oral Daily   pantoprazole  40 mg Oral BID AC   quiNIDine sulfate  200 mg Oral Q12H   sodium chloride flush  3 mL Intravenous Q12H    Allergies:  Allergies  Allergen Reactions   Metformin And Related Diarrhea    Family History   Problem Relation Age of Onset   Colon polyps Mother    Diabetes Mother    Heart attack Father    Diabetes Father    Stroke Paternal Grandfather    Colitis Sister    Heart disease Brother    Colitis Sister    Colon cancer Neg Hx      Physical Exam: Vitals:   01/27/23 1100  BP: 104/75  Pulse: 67  Resp: 16  Temp: 97.8 F (36.6 C)  TempSrc: Oral  SpO2: 97%    GEN- NAD, A&O x 3, normal affect HEENT: Normocephalic, atraumatic Lungs- CTAB, Normal effort.  Heart- Regular rate and rhythm, No M/G/R.  GI- Soft, NT, ND.  Extremities- No clubbing, cyanosis, or edema   Radiology/Studies: ECHOCARDIOGRAM LIMITED  Result Date: 01/24/2023    ECHOCARDIOGRAM LIMITED REPORT   Patient Name:   [name] Date of Exam: 01/24/2023 Medical Rec #:  [id number]          Height:       68.0 in Accession #:    [id number]         Weight:       170.9 lb Date of Birth:  1966/08/17          BSA:          1.912 m Patient Age:    57 years           BP:           0/0 mmHg Patient Gender: M                  HR:           67 bpm. Exam Location:  Outpatient Procedure: Limited Echo, Limited Color Doppler and Cardiac Doppler Indications:    Congestive Heart Failure I50.9  History:        Patient has prior history of Echocardiogram examinations, most                 recent 12/01/2022. Idiopathic CMP and CHF, CAD, Defibrillator,                 Signs/Symptoms:Dyspnea; Risk Factors:Diabetes, Hypertension,                 Dyslipidemia and GERD.  Sonographer:    Eulah Pont RDCS Referring Phys: [id number] [name] IMPRESSIONS  1. LVAD ramp study. Speed 5400 rpm. Septum L shift. LVIDD 4.5 cm. AoV closed with mild AI. No MR, mild TR. Speed increased to 5500 rpm without change and then to 5300 without change in findings. Left ventricular ejection fraction, by estimation, is <20%. The left ventricle has severely decreased function. The left ventricle demonstrates global hypokinesis. Left ventricular diastolic function could  not be evaluated. There is the interventricular septum is flattened in systole and diastole, consistent with right ventricular pressure and volume overload.  2. Right ventricular systolic function is moderately reduced. The right ventricular size is moderately enlarged.  3. The mitral valve is grossly normal. No evidence of mitral valve regurgitation.  4. AoV is closed. The aortic valve is tricuspid. Aortic valve regurgitation is  mild. No aortic stenosis is present. FINDINGS  Left Ventricle: LVAD ramp study. Speed 5400 rpm. Septum L shift. LVIDD 4.5 cm. AoV closed with mild AI. No MR, mild TR. Speed increased to 5500 rpm without change and then to 5300 without change in findings. Left ventricular ejection fraction, by estimation, is <20%. The left ventricle has severely decreased function. The left ventricle demonstrates global hypokinesis. The interventricular septum is flattened in systole and diastole, consistent with right ventricular pressure and volume overload.  Left ventricular diastolic function could not be evaluated. Left ventricular diastolic function could not be evaluated due to nondiagnostic images. Right Ventricle: The right ventricular size is moderately enlarged. No increase in right ventricular wall thickness. Right ventricular systolic function is moderately reduced. Pericardium: There is no evidence of pericardial effusion. Mitral Valve: The mitral valve is grossly normal. Tricuspid Valve: The tricuspid valve is grossly normal. Tricuspid valve regurgitation is mild . No evidence of tricuspid stenosis. Aortic Valve: AoV is closed. The aortic valve is tricuspid. Aortic valve regurgitation is mild. No aortic stenosis is present. Pulmonic Valve: The pulmonic valve was grossly normal. Pulmonic valve regurgitation is trivial. No evidence of pulmonic stenosis. Additional Comments: A device lead is visualized in the right atrium and right ventricle. Spectral Doppler performed. Color Doppler performed.   PULMONIC VALVE RVOT Peak grad: 1 mmHg   SHUNTS Pulmonic VTI: 0.088 m Lennie Odor MD Electronically signed by Lennie Odor MD Signature Date/Time: 01/24/2023/11:09:32 AM    Final     [id number] (personally reviewed)  TELEMETRY: VT in 170-180s on arrival, NSR with VP 70-80s post ATP (personally reviewed)  DEVICE HISTORY:  St. Jude Dual Chamber ICD implanted 12/2008, gen change 2018 for CHF History of appropriate therapy: Yes History of AAD therapy: Yes; currently on amiodarone and mexitil  Assessment/Plan: VT, refractory Despite oral therapy with amiodarone and mexitil Have discussed the possibility of Quinidine Sulfate (given ESRD) but will further discuss with EP MD prior to initiating.  Electrolytes pending.  Keep K > 4.0 and Mg > 2.0  His recurrent VT and multiple severe co morbidities portend a poor prognosis We have adjusted his ATP schemes to be very aggressive for the time being, will need to re-adjust prior to discharge.   HFrEF. , HMIII, ICM  St Jude ICD.  NSTEMI in March 2018 with DES to LAD and RCA, complicated by cardiogenic shock, low output requiring milrinone.  Echo 8/18 with EF 20-25%, moderate  He is s/p HM3 LVAD placement. He had pump thrombosis 6/19 due to [facility] outflow graft kinking.  He had pump exchange for another HM3.  HF/LVAD team admitting.  On midodrine 10 mg tid.  On coumdin.   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 M-W-F  Nephrology to follow   Diabetic Gastroparesis Additionally complicates his medical regimen Continue reglan 10 mg tid/ac, now off Marinol.  Continue Protonix bid.  Also gets Zofran.   For questions or updates, please contact [facility] Please consult [email or web address] for contact info under Cardiology/STEMI.  Dustin Flock, PA-C  01/27/2023 12:09 PM

## 2023-01-27 NOTE — ED Notes (Signed)
Pt placed on zoll pads.  LVAD team and cardiology at bedside.

## 2023-01-27 NOTE — Inpatient Diabetes Management (Signed)
Inpatient Diabetes Program Recommendations  AACE/ADA: New Consensus Statement on Inpatient Glycemic Control (2015)  Target Ranges:  Prepandial:   less than 140 mg/dL      Peak postprandial:   less than 180 mg/dL (1-2 hours)      Critically ill patients:  140 - 180 mg/dL   Lab Results  Component Value Date   GLUCAP 102 (H) 07/30/2019   HGBA1C 6.2 08/18/2022    Review of Glycemic Control  Latest Reference Range & Units 01/27/23 11:24  Glucose 70 - 99 mg/dL 54 (L)  (L): Data is abnormally low  Latest Reference Range & Units 01/27/23 11:24  AST 15 - 41 U/L 51 (H)  (H): Data is abnormally high  Diabetes history: DM, ESRD Outpatient Diabetes medications: Lantus 35 units QD Current orders for Inpatient glycemic control: Novolog 0-9 units TID  Inpatient Diabetes Program Recommendations:    Please consider decreasing correction:  Novolog 0-6 units TID  Will continue to follow while inpatient.  Thank you, Dulce Sellar, MSN, CDCES Diabetes Coordinator Inpatient Diabetes Program [phone] (team pager from 8a-5p)

## 2023-01-27 NOTE — Telephone Encounter (Signed)
Received page from pt this morning reporting he was shocked x 1 during HD treatment. Reports LOW FLOW prior to shock. Per [name] at HD center she had pulled 678 cc, and gave back 200 cc following shock. Will plan to pull < 2400 cc today. Pt confirms he is taking all medications as prescribed following conversation with [name].   Discussed with [name]. Order received to increase Amiodarone to 400 mg BID x 1 week, then resume 400 mg daily. Wants to see in clinic early next week.   Spoke with [name] regarding the above. Scheduled for f/u appt in VAD clinic 01/31/23 at 2 PM. Has appt with [name] to follow at 3:30 PM. He verbalized understanding to all the above.   Updated Amiodarone prescription sent to pt's pharmacy.   [name] Pagan RN VAD Coordinator  Office: [phone]  24/7 Pager: [phone]

## 2023-01-27 NOTE — Progress Notes (Signed)
LVAD Coordinator ED Encounter  [name] a 57 y.o. male that presented to [facility] ER today due to VT. He has a past medical history  has a past medical history of Angina, ASCVD (arteriosclerotic cardiovascular disease), Automatic implantable cardiac defibrillator -[employer]. Jude's, Benign neoplasm of colon, CHF (congestive heart failure) (HCC), Chronic systolic heart failure (HCC), Coronary artery disease, Crohn's disease (HCC), Deep venous thrombosis (HCC), Dialysis patient (HCC), Dyspnea, Gastroparesis, GERD (gastroesophageal reflux disease), High cholesterol, Hyperlipidemia, Hypersomnolent, Hypertension, essential, Ischemic cardiomyopathy, Type II or unspecified type diabetes mellitus without mention of complication, not stated as uncontrolled, and Unspecified gastritis and gastroduodenitis without mention of hemorrhage.[name]   LVAD is a HM 3 and was implanted on 03/01/18 by Dr Donata Clay for destination therapy.   Received page from pt this morning reporting he was shocked x 1 during HD treatment. Reports LOW FLOW prior to shock.   Device clinic called and reported multiple rounds of ATP. Refractory VT.    5/23 @ 19:54 sustained VT, rate 142, ATPx1 without resolution 19:55, sustained VT, rate 142, ATPx1 without resolution,  detection 141-199 19:56, sustained VT, rate 141, ATP x1 without resolution 19:57, sustained VT, rate 142, ATP x3, ATP x2 without resolution 19:59, sustained VT, rate 142, ATPx1 without resolution 20:00 sustained VT, rate 142, ATPx3, ATPx3 without resolution 20:01 sustained VT, ATPx3, ATPx3, rate 142, unsuccessful duration 1hr 21:18, sustained VT, ATPx3, rate 148, unsuccessful 21:19, sustained VT, ATPx3, ATPx3, rate 150, unsuccessful, duration 42sec 21:43, sustained VT, ATPx3, ATPx2, rate 151, unsuccessful 21:45, sustaiined VT, ATPx3, ATPx3, rate 150, duration 38sec 22:06, sustained VT, ATPx6, unsuccessful, therapies exhausted, rate 151  Discussed with  provider team and decision was made to advise pt to come to ER for assessment.   Pt completed HD treatment and came to ER. Upon arrival pt found to be in VT rate 180s. 3 LOW FLOWS noted on VAD interrogation. EP providers to bedside with device programmer. ATP in ER with conversion. See [name] PA note for details.   Pt admitted to [facility] for further workup and medication titration.    Vital signs: HR: 180 VT Doppler MAP: unable to obtain with arrythmia Automated BP:117/86 (97) O2 Sat: 98% on RA  LVAD interrogation reveals:  Speed: 5300 Flow: 3.1 Power: 4.0 w  PI: 3.0  Alarms: 3 LOW FLOWs this morning Events: 100 + PI events  Drive Line: Existing VAD dressing removed and site care performed using sterile technique. Drive line exit site cleaned with Chlora prep applicators x 2, allowed to dry, and gauze dressing without silver strip placed. Exit site healed and incorporated, the velour is fully implanted at exit site. No redness, tenderness, drainage, foul odor or rash noted. Drive line anchor re-applied. Weekly dressing changes using daily kit per bedside RN. Next dressing change due 02/03/23.    Significant Events with LVAD:   Updated VAD Providers (Amy Clegg NP and Dr Gala Romney) about the above. No LVAD issues and pump is functioning as expected. Able to independently manage LVAD equipment. No LVAD needs at this time.    Alyce Pagan RN VAD Coordinator  Office: [phone]  24/7 Pager: [phone]

## 2023-01-27 NOTE — ED Provider Notes (Signed)
[place] EMERGENCY DEPARTMENT AT [facility] Provider Note   CSN: [id number] Arrival date & time: 01/27/23  1053     History  No chief complaint on file.   [name] is a 57 y.o. male.  Patient is a 57 year old male who presents in V. tach.  He is an LVAD patient.  He also has end-stage renal disease and is on dialysis.  His cardiology team got an alert that he had been in sustained V. tach through the night.  He did get 1 shock while he was at dialysis which was unsuccessful.  He is asymptomatic.  He has no dizziness.  No shortness of breath.  No chest pain.  His blood pressure has been stable.       Home Medications Prior to Admission medications   Medication Sig Start Date End Date Taking? Authorizing Provider  quiNIDine sulfate 200 MG tablet Take 1 tablet (200 mg total) by mouth in the morning and at bedtime. 01/27/23  Yes Bensimhon, Bevelyn Buckles, MD  amiodarone (PACERONE) 400 MG tablet Take 400 mg (1 tablet) twice a day x 1 week starting 01/27/23. Then decrease to 400 mg (1 tablet) daily. 01/27/23   Laurey Morale, MD  aspirin EC 81 MG tablet Take 1 tablet (81 mg total) by mouth daily. 08/20/18   Janetta Hora, PA-C  atorvastatin (LIPITOR) 40 MG tablet Take 1 tablet (40 mg total) by mouth daily at 6 PM. 11/20/20   Leeroy Bock, MD  benzonatate (TESSALON) 100 MG capsule Take 2 capsules (200 mg total) by mouth 3 (three) times daily as needed for cough. 11/23/22   Omar Person, MD  calcitRIOL (ROCALTROL) 0.5 MCG capsule Take 1 capsule (0.5 mcg total) by mouth every Monday, Wednesday, and Friday. 06/06/18   Alford Highland, NP  cefadroxil (DURICEF) 500 MG capsule Take 2 capsules (1,000 mg total) by mouth 3 (three) times a week. Take AFTER dialysis sessions 06/13/22   Blanchard Kelch, NP  citalopram (CELEXA) 20 MG tablet Take 1 tablet (20 mg total) by mouth daily. 03/19/18   Alford Highland, NP  COLACE 100 MG capsule Take 100 mg by mouth daily as  needed for moderate constipation. 07/30/19   [provider]  fluticasone (FLONASE) 50 MCG/ACT nasal spray Place 1 spray into both nostrils daily. 11/23/22   Omar Person, MD  hydrALAZINE (APRESOLINE) 25 MG tablet Take 1 tablet three times a day on NON-dialysis days only 04/26/22   Laurey Morale, MD  insulin glargine (LANTUS) 100 UNIT/ML injection Inject 0.35 mLs (35 Units total) into the skin every morning. 08/25/22   Bess Kinds, MD  levocetirizine (XYZAL) 5 MG tablet Take 1 tablet (5 mg total) by mouth every evening. 11/23/22   Omar Person, MD  LORazepam (ATIVAN) 0.5 MG tablet Take 1 tablet (0.5 mg total) by mouth every 12 (twelve) hours as needed (nausea). 02/26/20   [name] M, PA-C  metoCLOPramide (REGLAN) 10 MG tablet Take 1 tablet (10 mg total) by mouth 3 (three) times daily. Patient taking differently: Take 10 mg by mouth 3 (three) times daily before meals. 01/22/18   Alford Highland, NP  mexiletine (MEXITIL) 150 MG capsule Take 1 capsule (150 mg total) by mouth 2 (two) times daily. Patient taking differently: Take 150 mg by mouth 2 (two) times daily. Patient taking 2 - 3 times a week due to medication causing nausea 12/05/22   Herbie Saxon, MD  midodrine (PROAMATINE) 5  MG tablet Take 2 tablets (10 mg total) by mouth every Monday, Wednesday, and Friday. Take 1 tablet before dialysis treatment 09/07/22   Laurey Morale, MD  multivitamin (RENA-VIT) TABS tablet Take 1 tablet by mouth daily.    [provider]  ondansetron (ZOFRAN-ODT) 4 MG disintegrating tablet Take by mouth as needed. Patient not taking: Reported on 01/24/2023 03/19/18   [provider]  oxyCODONE-acetaminophen (PERCOCET) 7.5-325 MG tablet Take 1 tablet by mouth 3 (three) times daily as needed for pain. 06/28/21   [provider]  pantoprazole (PROTONIX) 40 MG tablet Take 1 tablet (40 mg total) by mouth 2 (two) times daily before lunch and supper. 05/07/19   Barrett,  Joline Salt, PA-C  promethazine (PHENERGAN) 12.5 MG tablet TAKE 1 TABLET (12.5 MG TOTAL) BY MOUTH EVERY 6 (SIX) HOURS AS NEEDED FOR NAUSEA OR VOMITING. 07/20/20   Laurey Morale, MD  quiNIDine sulfate 200 MG tablet Take 1 tablet (200 mg total) by mouth 2 (two) times daily. 01/27/23   Laurey Morale, MD  sevelamer carbonate (RENVELA) 0.8 g PACK packet Take 0.8 g by mouth 3 (three) times daily with meals. 06/16/19   Bhagat, Sharrell Ku, PA  triamcinolone ointment (KENALOG) 0.1 % Apply 1 Application topically 2 (two) times daily. 08/25/22   Bess Kinds, MD  warfarin (COUMADIN) 5 MG tablet TAKE 1 TABLET BY MOUTH EVERY DAY 07/25/22   Laurey Morale, MD      Allergies    Metformin and related    Review of Systems   Review of Systems  Constitutional:  Negative for fatigue.  Respiratory:  Negative for shortness of breath.   Cardiovascular:  Negative for chest pain.  Gastrointestinal:  Negative for nausea and vomiting.  Neurological:  Negative for dizziness and light-headedness.    Physical Exam Updated Vital Signs BP 110/75 (BP Location: Left Arm)   Pulse 67   Temp 98.2 F (36.8 C) (Oral)   Resp 16   SpO2 98%  Physical Exam Cardiovascular:     Comments: Hum of LVAD present Pulmonary:     Effort: Pulmonary effort is normal.     Breath sounds: Normal breath sounds.  Musculoskeletal:     Cervical back: Normal range of motion and neck supple.     Comments: Status post bilateral lower extremity amputations  Skin:    General: Skin is warm and dry.  Neurological:     Mental Status: He is alert and oriented to person, place, and time.     ED Results / Procedures / Treatments   Labs (all labs ordered are listed, but only abnormal results are displayed) Labs Reviewed  CBC - Abnormal; Notable for the following components:      Result Value   RBC 3.59 (*)    Hemoglobin 10.0 (*)    HCT 31.3 (*)    RDW 17.1 (*)    Platelets 127 (*)    All other components within normal limits   COMPREHENSIVE METABOLIC PANEL - Abnormal; Notable for the following components:   Potassium 3.3 (*)    Chloride 95 (*)    Glucose, Bld 54 (*)    Creatinine, Ser 3.80 (*)    Calcium 8.3 (*)    Albumin 3.2 (*)    AST 51 (*)    GFR, Estimated 18 (*)    All other components within normal limits  PROTIME-INR - Abnormal; Notable for the following components:   Prothrombin Time 32.6 (*)    INR 3.2 (*)  All other components within normal limits  LACTATE DEHYDROGENASE - Abnormal; Notable for the following components:   LDH 225 (*)    All other components within normal limits  HIV ANTIBODY (ROUTINE TESTING W REFLEX)  MAGNESIUM  HEMOGLOBIN A1C    EKG None  Radiology No results found.  Procedures Procedures    Medications Ordered in ED Medications  aspirin EC tablet 81 mg (81 mg Oral Given 01/27/23 1313)  cefadroxil (DURICEF) capsule 1,000 mg (has no administration in time range)  atorvastatin (LIPITOR) tablet 40 mg (has no administration in time range)  midodrine (PROAMATINE) tablet 10 mg (0 mg Oral Hold 01/27/23 1313)  citalopram (CELEXA) tablet 20 mg (20 mg Oral Given 01/27/23 1313)  calcitRIOL (ROCALTROL) capsule 0.5 mcg (0.5 mcg Oral Given 01/27/23 1313)  docusate sodium (COLACE) capsule 100 mg (has no administration in time range)  metoCLOPramide (REGLAN) tablet 10 mg (has no administration in time range)  pantoprazole (PROTONIX) EC tablet 40 mg (has no administration in time range)  multivitamin (RENA-VIT) tablet 1 tablet (1 tablet Oral Given 01/27/23 1313)  benzonatate (TESSALON) capsule 200 mg (has no administration in time range)  fluticasone (FLONASE) 50 MCG/ACT nasal spray 1 spray (has no administration in time range)  acetaminophen (TYLENOL) tablet 650 mg (has no administration in time range)  ondansetron (ZOFRAN) injection 4 mg (has no administration in time range)  sodium chloride flush (NS) 0.9 % injection 3 mL (3 mLs Intravenous Given 01/27/23 1507)  sodium  chloride flush (NS) 0.9 % injection 3 mL (has no administration in time range)  0.9 %  sodium chloride infusion (has no administration in time range)  insulin aspart (novoLOG) injection 0-9 Units (has no administration in time range)  amiodarone (PACERONE) tablet 400 mg (400 mg Oral Given 01/27/23 1313)  loratadine (CLARITIN) tablet 10 mg (has no administration in time range)  Chlorhexidine Gluconate Cloth 2 % PADS 6 each (6 each Topical Given 01/27/23 1200)  quiNIDine sulfate tablet 200 mg (200 mg Oral Given 01/27/23 1437)  potassium chloride SA (KLOR-CON M) CR tablet 40 mEq (40 mEq Oral Given 01/27/23 1437)    ED Course/ Medical Decision Making/ A&P                             Medical Decision Making Risk Prescription drug management. Decision regarding hospitalization.   Patient is a 57 year old male who has an LVAD and presents in V. tach.  LVAD team is at bedside.  Patient was also seen by the CHF/cardiology team who was able to get the patient back in a sinus rhythm with ATP.  Will be admitted by the cardiology service.  CRITICAL CARE Performed by: Rolan Bucco Total critical care time: 30 minutes Critical care time was exclusive of separately billable procedures and treating other patients. Critical care was necessary to treat or prevent imminent or life-threatening deterioration. Critical care was time spent personally by me on the following activities: development of treatment plan with patient and/or surrogate as well as nursing, discussions with consultants, evaluation of patient's response to treatment, examination of patient, obtaining history from patient or surrogate, ordering and performing treatments and interventions, ordering and review of laboratory studies, ordering and review of radiographic studies, pulse oximetry and re-evaluation of patient's condition.   Final Clinical Impression(s) / ED Diagnoses Final diagnoses:  Ventricular tachycardia (HCC)    Rx / DC  Orders ED Discharge Orders  Ordered    quiNIDine sulfate 200 MG tablet  2 times daily        01/27/23 1529              Rolan Bucco, MD 01/27/23 1549

## 2023-01-27 NOTE — Telephone Encounter (Signed)
Going to ER 

## 2023-01-27 NOTE — TOC Transition Note (Signed)
TOC discharge meds READY and LOCATED in TOC pharmacy on 2nd floor awaiting pt discharge. 

## 2023-01-27 NOTE — ED Notes (Signed)
LVAD team at bedside

## 2023-01-27 NOTE — Progress Notes (Signed)
ANTICOAGULATION CONSULT NOTE - Initial Consult  Pharmacy Consult for warfarin Indication: LVAD  Allergies  Allergen Reactions   Metformin And Related Diarrhea    Patient Measurements:    Vital Signs: Temp: 98.2 F (36.8 C) (05/24 1200) Temp Source: Oral (05/24 1200) BP: 110/75 (05/24 1200) Pulse Rate: 67 (05/24 1100)  Labs: Recent Labs    01/27/23 1124  HGB 10.0*  HCT 31.3*  PLT 127*  LABPROT 32.6*  INR 3.2*  CREATININE 3.80*    CrCl cannot be calculated (Unknown ideal weight.).   Medical History: Past Medical History:  Diagnosis Date   Angina    ASCVD (arteriosclerotic cardiovascular disease)    , Anterior infarction 2005, LAD diagonal bifurcation intervention 03/2004   Automatic implantable cardiac defibrillator -[employer]. Jude's        Benign neoplasm of colon    CHF (congestive heart failure) (HCC)    Chronic systolic heart failure (HCC)    Coronary artery disease     Widely patent previously placed stents in the left anterior    Crohn's disease (HCC)    Deep venous thrombosis (HCC)    Recurrent-on Coumadin   Dialysis patient (HCC)    Dyspnea    Gastroparesis    GERD (gastroesophageal reflux disease)    High cholesterol    Hyperlipidemia    Hypersomnolent    Previous diagnosis of narcolepsy   Hypertension, essential    Ischemic cardiomyopathy    Ejection fraction 15-20% catheterization 2010   Type II or unspecified type diabetes mellitus without mention of complication, not stated as uncontrolled    Unspecified gastritis and gastroduodenitis without mention of hemorrhage      Assessment: 57 yo male on chronic Coumadin 5 mg daily for LVAD, hx DVT with INR slightly above goal today at 3.2.  Hgb 10, Pltc 127.  No overt bleeding or complications noted.  Goal of Therapy:  INR 2-3 Monitor platelets by anticoagulation protocol: Yes   Plan:  No Coumadin tonight due to INR over goal, resume tomorrow. Daily INR.  Reece Leader, Colon Flattery,  [facility] Clinical Pharmacist  01/27/2023 2:46 PM   [facility] pharmacy phone numbers are listed on [email or web address]

## 2023-01-27 NOTE — Consult Note (Signed)
Reason for Consult: Continuity of ESRD care Referring Physician: Arvilla Meres MD (Advanced heart failure service)  HPI:  57 year old man with past medical history significant for type 2 diabetes mellitus, coronary artery disease, severe ischemic cardiomyopathy status post HeartMate 3 LVAD, factor V Leiden deficiency, severe peripheral vascular disease status post bilateral above-knee amputations, paroxysmal atrial fibrillation, episode of ventricular tachycardia and end-stage renal disease on hemodialysis on a Monday/Wednesday/Friday schedule.  Admitted to the hospital with recurrent episodes of sustained ventricular tachycardia with ICD firing while on amiodarone and mexiletine (taking the latter sporadically because of nausea).  Earlier today was on dialysis when he had an episode of ventricular tachycardia with hypotension limiting ultrafiltration (he states his goal today was about 1.5 L) and resulting in ICD shock.  He denies any vomiting or diarrhea and has not had any recent fevers or chills.  He denies any current chest pain, dizziness or vision changes.  Dialysis prescription: MWF, [facility], 180 dialyzer, 4 hours, BFR 400/DFR 600, EDW 77 kg, 2K/2.0 calcium, UF profile #4, heparin 7000 unit bolus, left IJ TDC, Mircera 30 mcg IV q. 2 weeks, Hectorol 5 mcg IV q. dialysis, Sensipar 180 mg 3 times weekly HD.  Past Medical History:  Diagnosis Date   Angina    ASCVD (arteriosclerotic cardiovascular disease)    , Anterior infarction 2005, LAD diagonal bifurcation intervention 03/2004   Automatic implantable cardiac defibrillator -St. Jude's        Benign neoplasm of colon    CHF (congestive heart failure) (HCC)    Chronic systolic heart failure (HCC)    Coronary artery disease     Widely patent previously placed stents in the left anterior    Crohn's disease (HCC)    Deep venous thrombosis (HCC)    Recurrent-on Coumadin   Dialysis patient (HCC)    Dyspnea     Gastroparesis    GERD (gastroesophageal reflux disease)    High cholesterol    Hyperlipidemia    Hypersomnolent    Previous diagnosis of narcolepsy   Hypertension, essential    Ischemic cardiomyopathy    Ejection fraction 15-20% catheterization 2010   Type II or unspecified type diabetes mellitus without mention of complication, not stated as uncontrolled    Unspecified gastritis and gastroduodenitis without mention of hemorrhage     Past Surgical History:  Procedure Laterality Date   A/V SHUNTOGRAM N/A 05/24/2018   Procedure: A/V SHUNTOGRAM;  Surgeon: Cephus Shelling, MD;  Location: [facility] INVASIVE CV LAB;  Service: Cardiovascular;  Laterality: N/A;   ABDOMINAL AORTOGRAM W/LOWER EXTREMITY N/A 03/18/2019   Procedure: ABDOMINAL AORTOGRAM W/LOWER EXTREMITY;  Surgeon: Maeola Harman, MD;  Location: [facility] INVASIVE CV LAB;  Service: Cardiovascular;  Laterality: N/A;   AMPUTATION Right 03/21/2019   Procedure: AMPUTATION DIGIT RIGHT FOURTH TOE;  Surgeon: Maeola Harman, MD;  Location: [facility] OR;  Service: Vascular;  Laterality: Right;   AMPUTATION Left 05/29/2019   Procedure: AMPUTATION LEFT GREAT TOE;  Surgeon: Nada Libman, MD;  Location: [facility] OR;  Service: Vascular;  Laterality: Left;   AMPUTATION Right 06/02/2019   Procedure: ABOVE KNEE AMPUTATION;  Surgeon: Larina Earthly, MD;  Location: [facility] OR;  Service: Vascular;  Laterality: Right;   AMPUTATION Left 07/23/2019   Procedure: AMPUTATION ABOVE KNEE LEFT;  Surgeon: Sherren Kerns, MD;  Location: [facility] OR;  Service: Vascular;  Laterality: Left;   APPENDECTOMY     AV FISTULA PLACEMENT Right 06/26/2017   Procedure: ARTERIOVENOUS (AV) FISTULA CREATION VERSUS GRAFT  RIGHT  ARM;  Surgeon: Fransisco Hertz, MD;  Location: [facility] OR;  Service: Vascular;  Laterality: Right;   AV FISTULA PLACEMENT Right 10/31/2017   Procedure: INSERTION OF ARTERIOVENOUS (AV) ARTEGRAFT,  RIGHT UPPER ARM;  Surgeon: Fransisco Hertz, MD;  Location: [facility] OR;  Service:  Vascular;  Laterality: Right;   AV FISTULA PLACEMENT Left 05/29/2018   Procedure: ARTERIOVENOUS (AV) FISTULA CREATION  LEFT UPPER EXTREMITY;  Surgeon: Maeola Harman, MD;  Location: [facility] OR;  Service: Vascular;  Laterality: Left;   AV FISTULA PLACEMENT Left 08/09/2018   Procedure: INSERTION OF ARTERIOVENOUS (AV) GORE-TEX GRAFT LEFT UPPER ARM;  Surgeon: Maeola Harman, MD;  Location: [facility] OR;  Service: Vascular;  Laterality: Left;   CARDIAC DEFIBRILLATOR PLACEMENT  2010   St. Jude ICD   CENTRAL LINE INSERTION Right 05/24/2018   Procedure: CENTRAL LINE INSERTION;  Surgeon: Cephus Shelling, MD;  [facility]: [facility] INVAS[facility] LAB;  Service: Cardiovascular;  Laterality: Right;   COLECTOMY  ~ 2003   "for Crohn's" ascending colon.     CORONARY STENT INTERVENTION N/A 11/25/2016   Procedure: Coronary Stent Intervention;  Surgeon: Marykay Lex, MD;  Location: [facility] INVASIVE CV LAB;  Service: Cardiovascular;  Laterality: N/A;   EP IMPLANTABLE DEVICE N/A 09/14/2016   Procedure: ICD Generator Changeout;  Surgeon: Duke Salvia, MD;  Location: [facility] INVASIVE CV LAB;  Service: Cardiovascular;  Laterality: N/A;   ESOPHAGOGASTRODUODENOSCOPY N/A 07/12/2017   Procedure: ESOPHAGOGASTRODUODENOSCOPY (EGD);  Surgeon: Beverley Fiedler, MD;  Location: [facility] ENDOSCOPY;  Service: Gastroenterology;  Laterality: N/A;  VAD pt so VAD team will need to accompany pt.    FETAL SURGERY FOR CONGENITAL HERNIA     ????? pt has no knowledge of this.[name]     FISTULOGRAM Left 08/09/2018   Procedure: FISTULOGRAM LEFT ARM;  Surgeon: Maeola Harman, MD;  Location: [facility] OR;  Service: Vascular;  Laterality: Left;   INGUINAL HERNIA REPAIR     INSERTION OF DIALYSIS CATHETER Right 06/26/2017   Procedure: INSERTION OF TUNNELED  DIALYSIS CATHETER RIGHT INTERNAL JUGULAR;  Surgeon: Fransisco Hertz, MD;  Location: [facility] OR;  Service: Vascular;  Laterality: Right;   INSERTION OF IMPLANTABLE LEFT VENTRICULAR ASSIST DEVICE N/A 06/12/2017    Procedure: INSERTION OF IMPLANTABLE LEFT VENTRICULAR ASSIST DEVICE-HEARTMATE 3;  Surgeon: Kerin Perna, MD;  Location: [facility] OR;  Service: Open Heart Surgery;  Laterality: N/A;  HM3 LVAD  CIRC ARREST  NITRIC OXIDE   INSERTION OF IMPLANTABLE LEFT VENTRICULAR ASSIST DEVICE N/A 02/28/2018   Procedure: REDO INSERTION OF IMPLANTABLE LEFT VENTRICULAR ASSIST DEVICE - HEARTMATE 3;  Surgeon: Kerin Perna, MD;  Location: [facility] OR;  Service: Open Heart Surgery;  Laterality: N/A;   IR FLUORO GUIDE CV LINE RIGHT  07/22/2019   IR REMOVAL TUN CV CATH W/O FL  02/26/2018   IR THORACENTESIS ASP PLEURAL SPACE W/IMG GUIDE  06/28/2017   IR US GUIDE VASC ACCESS RIGHT  07/22/2019   LIGATION OF ARTERIOVENOUS  FISTULA Right 10/31/2017   Procedure: LIGATION OF BRACHIO-BASILIC VEIN TRANSPOSITION RIGHT ARM;  Surgeon: Fransisco Hertz, MD;  Location: [facility] OR;  Service: Vascular;  Laterality: Right;   LOWER EXTREMITY ANGIOGRAPHY  05/02/2019   Procedure: Lower Extremity Angiography;  Surgeon: Cephus Shelling, MD;  Location: [facility] INVASIVE CV LAB;  Service: Cardiovascular;;   MULTIPLE EXTRACTIONS WITH ALVEOLOPLASTY N/A 06/08/2017   Procedure: MULTIPLE EXTRACTION WITH ALVEOLOPLASTY AND PRE PROSTHETIC SURGERY AS NEEDED;  Surgeon: Charlynne Pander, DDS;  Location: [facility] OR;  Service: Oral Surgery;  Laterality: N/A;   PERIPHERAL VASCULAR BALLOON ANGIOPLASTY  03/18/2019   Procedure: PERIPHERAL VASCULAR BALLOON ANGIOPLASTY;  Surgeon: Maeola Harman, MD;  Location: [facility] INVASIVE CV LAB;  Service: Cardiovascular;;  RT PT   PERIPHERAL VASCULAR INTERVENTION  03/18/2019   Procedure: PERIPHERAL VASCULAR INTERVENTION;  Surgeon: Maeola Harman, MD;  Location: [facility] INVASIVE CV LAB;  Service: Cardiovascular;;  RT SFA   PERIPHERAL VASCULAR INTERVENTION  05/02/2019   Procedure: PERIPHERAL VASCULAR INTERVENTION;  Surgeon: Cephus Shelling, MD;  Location: [facility] INVASIVE CV LAB;  Service: Cardiovascular;;  Lt. SFA   RIGHT HEART CATH N/A  06/07/2017   Procedure: RIGHT HEART CATH;  Surgeon: Laurey Morale, MD;  Location: [facility] INVASIVE CV LAB;  Service: Cardiovascular;  Laterality: N/A;   RIGHT HEART CATH N/A 02/08/2018   Procedure: RIGHT HEART CATH;  Surgeon: Laurey Morale, MD;  Location: [facility] INVASIVE CV LAB;  Service: Cardiovascular;  Laterality: N/A;   RIGHT HEART CATH N/A 08/09/2018   Procedure: RIGHT HEART CATH;  Surgeon: Laurey Morale, MD;  Location: [facility] INVASIVE CV LAB;  Service: Cardiovascular;  Laterality: N/A;   RIGHT/LEFT HEART CATH AND CORONARY ANGIOGRAPHY N/A 11/23/2016   Procedure: Right/Left Heart Cath and Coronary Angiography;  Surgeon: Laurey Morale, MD;  Location: [facility] INVASIVE CV LAB;  Service: Cardiovascular;  Laterality: N/A;   TEE WITHOUT CARDIOVERSION N/A 06/12/2017   Procedure: TRANSESOPHAGEAL ECHOCARDIOGRAM (TEE);  Surgeon: Donata Clay, Theron Arista, MD;  Location: [facility] OR;  Service: Open Heart Surgery;  Laterality: N/A;   TEE WITHOUT CARDIOVERSION N/A 02/28/2018   Procedure: TRANSESOPHAGEAL ECHOCARDIOGRAM (TEE);  Surgeon: Donata Clay, Theron Arista, MD;  Location: [facility] OR;  Service: Open Heart Surgery;  Laterality: N/A;   TEMPORARY DIALYSIS CATHETER Left 05/24/2018   Procedure: TEMPORARY DIALYSIS CATHETER;  Surgeon: Cephus Shelling, MD;  [facility]: [facility] INVASIVE CV LAB;  Service: Cardiovascular;  Laterality: Left;   THROMBECTOMY AND REVISION OF ARTERIOVENTOUS (AV) GORETEX  GRAFT Right 12/12/2017   Procedure: THROMBECTOMY ARTERIOVENTOUS (AV) GORETEX  GRAFT RIGHT UPPER ARM;  Surgeon: Fransisco Hertz, MD;  Location: [facility] OR;  Service: Vascular;  Laterality: Right;   TRANSMETATARSAL AMPUTATION Right 04/30/2019   Procedure: RIGHT TRANSMETATARSAL AMPUTATION;  Surgeon: Maeola Harman, MD;  Location: [facility] OR;  Service: Vascular;  Laterality: Right;    Family History  Problem Relation Age of Onset   Colon polyps Mother    Diabetes Mother    Heart attack Father    Diabetes Father    Stroke Paternal Grandfather    Colitis Sister     Heart disease Brother    Colitis Sister    Colon cancer Neg Hx     Social History:  reports that he has never smoked. He has been exposed to tobacco smoke. He has never used smokeless tobacco. He reports that he does not drink alcohol and does not use drugs.  Allergies:  Allergies  Allergen Reactions   Metformin And Related Diarrhea    Medications: I have reviewed the patient's current medications. Scheduled:  amiodarone  400 mg Oral BID   aspirin EC  81 mg Oral Daily   atorvastatin  40 mg Oral q1800   calcitRIOL  0.5 mcg Oral Q M,W,F   cefadroxil  1,000 mg Oral Once per day on Mon Wed Fri   Chlorhexidine Gluconate Cloth  6 each Topical Daily   citalopram  20 mg Oral Daily   fluticasone  1 spray Each Nare Daily   insulin aspart  0-9  Units Subcutaneous TID WC   loratadine  10 mg Oral QHS   metoCLOPramide  10 mg Oral TID   midodrine  10 mg Oral Q M,W,F   multivitamin  1 tablet Oral Daily   pantoprazole  40 mg Oral BID AC   sodium chloride flush  3 mL Intravenous Q12H       Latest Ref Rng & Units 01/27/2023   11:24 AM 07/30/2019    5:00 AM 07/28/2019    7:23 AM  BMP  Glucose 70 - 99 mg/dL 54  016  010   BUN 6 - 20 mg/dL 11  37  37   Creatinine 0.61 - 1.24 mg/dL 9.32  3.55  7.32   Sodium 135 - 145 mmol/L 135  134  138   Potassium 3.5 - 5.1 mmol/L 3.3  4.6  3.8   Chloride 98 - 111 mmol/L 95  96  100   CO2 22 - 32 mmol/L 29  26  25    Calcium 8.9 - 10.3 mg/dL 8.3  9.1  8.8       Latest Ref Rng & Units 01/27/2023   11:24 AM 07/30/2019    5:00 AM 07/29/2019    6:37 AM  CBC  WBC 4.0 - 10.5 K/uL 5.5  10.9  14.1   Hemoglobin 13.0 - 17.0 g/dL 20.2  8.1  8.5   Hematocrit 39.0 - 52.0 % 31.3  28.3  29.9   Platelets 150 - 400 K/uL 127  313  311      No results found.  Review of Systems  Constitutional:  Positive for fatigue. Negative for chills and fever.  HENT:  Negative for congestion, nosebleeds, sore throat and trouble swallowing.   Eyes:  Negative for photophobia  and visual disturbance.  Respiratory:  Negative for cough, chest tightness and shortness of breath.   Cardiovascular:  Negative for chest pain.  Gastrointestinal:  Positive for nausea. Negative for abdominal pain, blood in stool and vomiting.  Endocrine: Negative for cold intolerance and heat intolerance.  Genitourinary:  Negative for flank pain and hematuria.  Musculoskeletal:  Negative for back pain and neck pain.  Neurological:  Negative for dizziness and tremors.   Blood pressure 110/75, pulse 67, temperature 98.2 F (36.8 C), temperature source Oral, resp. rate 16, SpO2 98 %. Physical Exam Vitals and nursing note reviewed.  Constitutional:      Appearance: Normal appearance. He is normal weight. He is not ill-appearing.  HENT:     Head: Normocephalic and atraumatic.     Right Ear: External ear normal.     Left Ear: External ear normal.     Nose: Nose normal.     Mouth/Throat:     Mouth: Mucous membranes are moist.     Pharynx: Oropharynx is clear.  Eyes:     Extraocular Movements: Extraocular movements intact.     Conjunctiva/sclera: Conjunctivae normal.  Cardiovascular:     Rate and Rhythm: Normal rate and regular rhythm.     Pulses: Normal pulses.     Comments: LVAD hum Pulmonary:     Effort: Pulmonary effort is normal.     Breath sounds: Normal breath sounds. No wheezing or rales.  Abdominal:     General: Abdomen is flat.     Palpations: Abdomen is soft.     Tenderness: There is no abdominal tenderness. There is no guarding.  Musculoskeletal:        General: Normal range of motion.     Cervical back: Normal  range of motion and neck supple.     Comments: Status post bilateral AKA, LVAD driveline in place.  Left IJ TDC with intact dressing  Skin:    General: Skin is warm and dry.  Neurological:     General: No focal deficit present.     Mental Status: He is alert and oriented to person, place, and time.     Assessment/Plan: 1.  Refractory ventricular  tachycardia: Persistent despite of ongoing treatment with amiodarone and mexiletine resulting in periodic ICD shocks.  Additional management per cardiology/EP service. 2.  Severe ischemic cardiomyopathy/congestive heart failure with reduced ejection fraction: Status post HeartMate 3 LVAD and ongoing anticoagulation with Coumadin and blood pressure support with midodrine.  He is on long-term antibiotic therapy with cefadroxil for chronic driveline infection. 3.  End-stage renal disease: Usually on a Monday/Wednesday/Friday dialysis schedule and underwent hemodialysis with intradialytic hypotension/ventricular tachycardia earlier today.  Will continue to follow him during his hospital course to decide on need for acute dialysis otherwise maintain MWF schedule. 4.  Anemia: Hemoglobin/hematocrit acceptable at this time, will continue to monitor trend to decide on need to adjust ESA. 5.  Secondary hyperparathyroidism: Will add on phosphorus level to labs from this morning and decide on need for additional phosphorus binder.  Resume Hectorol and Sensipar for PTH control. 6.  Nutrition: Continue renal diet/fluid restriction with ongoing protein supplementation.  Will restart renal multivitamin.  Aisia Correira K. 01/27/2023, 1:42 PM

## 2023-01-27 NOTE — ED Triage Notes (Signed)
Pt POV from dialysis due to pacemaker reporting he was in "v-tach since last night at 2000."   Cardiologist received information at 0200 today.  From dialysis sent to ED.

## 2023-01-28 ENCOUNTER — Other Ambulatory Visit (HOSPITAL_COMMUNITY): Payer: Self-pay

## 2023-01-28 DIAGNOSIS — I5020 Unspecified systolic (congestive) heart failure: Secondary | ICD-10-CM | POA: Diagnosis not present

## 2023-01-28 DIAGNOSIS — I472 Ventricular tachycardia, unspecified: Secondary | ICD-10-CM | POA: Diagnosis not present

## 2023-01-28 LAB — BASIC METABOLIC PANEL
Anion gap: 13 (ref 5–15)
BUN: 19 mg/dL (ref 6–20)
CO2: 26 mmol/L (ref 22–32)
Calcium: 9.2 mg/dL (ref 8.9–10.3)
Chloride: 96 mmol/L — ABNORMAL LOW (ref 98–111)
Creatinine, Ser: 5.64 mg/dL — ABNORMAL HIGH (ref 0.61–1.24)
GFR, Estimated: 11 mL/min — ABNORMAL LOW (ref >60)
Glucose, Bld: 122 mg/dL — ABNORMAL HIGH (ref 70–99)
Potassium: 4.1 mmol/L (ref 3.5–5.1)
Sodium: 135 mmol/L (ref 135–145)

## 2023-01-28 LAB — GLUCOSE, CAPILLARY
Glucose-Capillary: 128 mg/dL — ABNORMAL HIGH (ref 70–99)
Glucose-Capillary: 124 mg/dL — ABNORMAL HIGH (ref 70–99)
Glucose-Capillary: 118 mg/dL — ABNORMAL HIGH (ref 70–99)
Glucose-Capillary: 161 mg/dL — ABNORMAL HIGH (ref 70–99)
Glucose-Capillary: 182 mg/dL — ABNORMAL HIGH (ref 70–99)

## 2023-01-28 LAB — MAGNESIUM: Magnesium: 2 mg/dL (ref 1.7–2.4)

## 2023-01-28 LAB — HEMOGLOBIN A1C
Hgb A1c MFr Bld: 6.3 % — ABNORMAL HIGH (ref 4.8–5.6)
Mean Plasma Glucose: 134 mg/dL

## 2023-01-28 LAB — CBC
HCT: 29.7 % — ABNORMAL LOW (ref 39.0–52.0)
Hemoglobin: 9.5 g/dL — ABNORMAL LOW (ref 13.0–17.0)
MCH: 28 pg (ref 26.0–34.0)
MCHC: 32 g/dL (ref 30.0–36.0)
MCV: 87.6 fL (ref 80.0–100.0)
Platelets: 125 10*3/uL — ABNORMAL LOW (ref 150–400)
RBC: 3.39 MIL/uL — ABNORMAL LOW (ref 4.22–5.81)
RDW: 17.3 % — ABNORMAL HIGH (ref 11.5–15.5)
WBC: 4.6 10*3/uL (ref 4.0–10.5)
nRBC: 0 % (ref 0.0–0.2)

## 2023-01-28 LAB — PROTIME-INR
INR: 3.2 — ABNORMAL HIGH (ref 0.8–1.2)
Prothrombin Time: 33.3 seconds — ABNORMAL HIGH (ref 11.4–15.2)

## 2023-01-28 LAB — LACTATE DEHYDROGENASE: LDH: 154 U/L (ref 98–192)

## 2023-01-28 LAB — PHOSPHORUS: Phosphorus: 4.8 mg/dL — ABNORMAL HIGH (ref 2.5–4.6)

## 2023-01-28 MED ORDER — CINACALCET HCL 30 MG PO TABS
180.0000 mg | ORAL_TABLET | ORAL | Status: DC
  Administered 2023-01-30: 180 mg via ORAL
  Filled 2023-01-28: qty 6

## 2023-01-28 MED ORDER — DOXERCALCIFEROL 4 MCG/2ML IV SOLN
5.0000 ug | INTRAVENOUS | Status: DC
  Administered 2023-01-30: 5 ug via INTRAVENOUS
  Filled 2023-01-28: qty 4

## 2023-01-28 MED ORDER — WARFARIN SODIUM 5 MG PO TABS
5.0000 mg | ORAL_TABLET | Freq: Once | ORAL | Status: AC
  Administered 2023-01-28: 5 mg via ORAL
  Filled 2023-01-28: qty 1

## 2023-01-28 MED ORDER — WARFARIN - PHARMACIST DOSING INPATIENT: Freq: Every day | Status: DC

## 2023-01-28 NOTE — Progress Notes (Addendum)
ANTICOAGULATION CONSULT NOTE - Follow Up Consult  Pharmacy Consult for warfarin Indication: LVAD  Allergies  Allergen Reactions   Metformin And Related Diarrhea    Patient Measurements: Weight: 80.3 kg (177 lb 0.5 oz)  Vital Signs: Temp: 97.4 F (36.3 C) (05/25 0600) Temp Source: Oral (05/25 0600) BP: 102/84 (05/25 1500) Pulse Rate: 54 (05/25 1500)  Labs: Recent Labs    01/27/23 1124 01/28/23 0409  HGB 10.0* 9.5*  HCT 31.3* 29.7*  PLT 127* 125*  LABPROT 32.6* 33.3*  INR 3.2* 3.2*  CREATININE 3.80* 5.64*     Estimated Creatinine Clearance: 13.8 mL/min (A) (by C-G formula based on SCr of 5.64 mg/dL (H)).   Medical History: Past Medical History:  Diagnosis Date   Angina    ASCVD (arteriosclerotic cardiovascular disease)    , Anterior infarction 2005, LAD diagonal bifurcation intervention 03/2004   Automatic implantable cardiac defibrillator -St. Jude's        Benign neoplasm of colon    CHF (congestive heart failure) (HCC)    Chronic systolic heart failure (HCC)    Coronary artery disease     Widely patent previously placed stents in the left anterior    Crohn's disease (HCC)    Deep venous thrombosis (HCC)    Recurrent-on Coumadin   Dialysis patient (HCC)    Dyspnea    Gastroparesis    GERD (gastroesophageal reflux disease)    High cholesterol    Hyperlipidemia    Hypersomnolent    Previous diagnosis of narcolepsy   Hypertension, essential    Ischemic cardiomyopathy    Ejection fraction 15-20% catheterization 2010   Type II or unspecified type diabetes mellitus without mention of complication, not stated as uncontrolled    Unspecified gastritis and gastroduodenitis without mention of hemorrhage      Assessment: 56 yo male on chronic Coumadin 5 mg daily for LVAD, hx DVT with INR slightly above goal today at 3.2.Warfarin dose held 5/24 - will resume today so INR doesn't cfall too much  Hgb 10, Pltc 127.  No overt bleeding or complications  noted.  Goal of Therapy:  INR 2-3 Monitor platelets by anticoagulation protocol: Yes   Plan:  Warfarin 5mg  x1  Daily INR.    Leota Sauers Pharm.D. CPP, BCPS Clinical Pharmacist [phone] 01/28/2023 3:14 PM    [facility] pharmacy phone numbers are listed on [email or web address]

## 2023-01-28 NOTE — Progress Notes (Signed)
Advanced Heart Failure VAD Team Note  PCP-Cardiologist: None   Subjective:    Remains on amio and quinidine. No further VT.   Feels good. Denies CP or SOB.   LVAD INTERROGATION:  HeartMate 3 LVAD:   Flow 3.4 liters/min, speed 5300, power 4.0, PI 6.1.    Objective:    Vital Signs:   Temp:  [97.4 F (36.3 C)-98.3 F (36.8 C)] 97.4 F (36.3 C) (05/25 0600) Pulse Rate:  [30-182] 30 (05/25 1300) Resp:  [11-20] 11 (05/25 1200) BP: (82-133)/(64-94) 114/94 (05/25 1300) SpO2:  [95 %-100 %] 97 % (05/25 1300) Weight:  [80.3 kg] 80.3 kg (05/25 0600) Last BM Date : 01/26/23 Mean arterial Pressure 80-100  Intake/Output:   Intake/Output Summary (Last 24 hours) at 01/28/2023 1345 Last data filed at 01/28/2023 0800 Gross per 24 hour  Intake 420 ml  Output --  Net 420 ml     Physical Exam    General:  Sitting up in bed No resp difficulty HEENT: normal Neck: supple. No JVD.  Cor: Mechanical heart sounds with LVAD hum present. + HD cath Lungs: clear Abdomen: soft, nontender, nondistended. No hepatosplenomegaly. No bruits or masses. Good bowel sounds. Driveline: C/D/I; securement device intact and driveline incorporated Extremities: s/p B AKA Neuro: alert & orientedx3, cranial nerves grossly intact. moves all 4 extremities w/o difficulty. Affect pleasant   Telemetry   Sinus 60s Personally reviewed  Labs   Basic Metabolic Panel: Recent Labs  Lab 01/27/23 1124 01/27/23 1354 01/28/23 0408 01/28/23 0409  NA 135  --   --  135  K 3.3*  --   --  4.1  CL 95*  --   --  96*  CO2 29  --   --  26  GLUCOSE 54*  --   --  122*  BUN 11  --   --  19  CREATININE 3.80*  --   --  5.64*  CALCIUM 8.3*  --   --  9.2  MG  --  2.0  --  2.0  PHOS  --   --  4.8*  --     Liver Function Tests: Recent Labs  Lab 01/27/23 1124  AST 51*  ALT 32  ALKPHOS 92  BILITOT 0.8  PROT 7.4  ALBUMIN 3.2*   No results for input(s): "LIPASE", "AMYLASE" in the last 168 hours. No results for  input(s): "AMMONIA" in the last 168 hours.  CBC: Recent Labs  Lab 01/27/23 1124 01/28/23 0409  WBC 5.5 4.6  HGB 10.0* 9.5*  HCT 31.3* 29.7*  MCV 87.2 87.6  PLT 127* 125*    INR: Recent Labs  Lab 01/24/23 0000 01/27/23 1124 01/28/23 0409  INR 3.0 3.2* 3.2*    Other results: EKG:    Imaging   No results found.   Medications:     Scheduled Medications:  amiodarone  400 mg Oral BID   aspirin EC  81 mg Oral Daily   atorvastatin  40 mg Oral q1800   cefadroxil  1,000 mg Oral Once per day on Mon Wed Fri   Chlorhexidine Gluconate Cloth  6 each Topical Daily   [START ON 01/30/2023] cinacalcet  180 mg Oral Q M,W,F-HD   citalopram  20 mg Oral Daily   [START ON 01/30/2023] doxercalciferol  5 mcg Intravenous Q M,W,F-HD   fluticasone  1 spray Each Nare Daily   insulin aspart  0-9 Units Subcutaneous TID WC   loratadine  10 mg Oral QHS   metoCLOPramide  10 mg Oral TID   midodrine  10 mg Oral Q M,W,F   multivitamin  1 tablet Oral Daily   pantoprazole  40 mg Oral BID AC   quiNIDine sulfate  200 mg Oral BID   sodium chloride flush  3 mL Intravenous Q12H    Infusions:  sodium chloride      PRN Medications: sodium chloride, acetaminophen, benzonatate, docusate sodium, ondansetron (ZOFRAN) IV, sodium chloride flush   Assessment/Plan:    1. Refractory VT - VT with multiple ATPs over the last 24 hours.  - Presented to ED in V on 5/24. 3 low flows on VAD interrogation  - EP consulted . Device adjusted  - ATP in ED with conversion.  - Continue oral amiodarone + quinidine - QTc hard to assess with VAD artifact on ECG - EP following   2. HFrEF. , HMIII, ICM  St Jude ICD.  NSTEMI in March 2018 with DES to LAD and RCA, complicated by cardiogenic shock, low output requiring milrinone.  Echo 8/18 with EF 20-25%, moderate MR. CPX (8/18) with severe functional impairment due to HF.  He is s/p HM3 LVAD placement. He had pump thrombosis 6/19 due to [place] outflow graft kinking.  He  had pump exchange for another HM3.  LVAD parameters stable but he does have PI events and low flow events at times with HD. He takes midodrine pre-HD, but needs hydralazine on non-HD days.  - Had Ramp Echo 5/21. Speed decreased to 5300 - Volume managed per HD. Renal following - Continue midodrine 10 mg tid.  - On warfarin. INR 3.2 Discussed dosing with PharmD personally.  3. Driveline infection, chronic  - Continue cefadroxil long term   - No f/c  4. ESRD  - IHD. M-W-F  - Nephrology following   5. PAD - S/P bilateral AKA   6. Diabetic Gastroparesis He has seen gastroparesis specialist at [facility]. Surprisingly, gastric emptying study was normal. However, electrogastrogram showed poor gastric accomodation/capacity. Therefore, small frequent meals recommended.  He has been doing much better with this, minimal symptoms.  - Continue reglan 10 mg tid/ac, now off Marinol.  - Continue Protonix bid.   7. DMII - cover with SSI   I reviewed the LVAD parameters from today, and compared the results to the patient's prior recorded data.  No programming changes were made.  The LVAD is functioning within specified parameters.  The patient performs LVAD self-test daily.  LVAD interrogation was negative for any significant power changes, alarms or PI events/speed drops.  LVAD equipment check completed and is in good working order.  Back-up equipment present.   LVAD education done on emergency procedures and precautions and reviewed exit site care.  Length of Stay: 1  Arvilla Meres, MD 01/28/2023, 1:45 PM  VAD Team --- VAD ISSUES ONLY--- Pager [phone] (7am - 7am)  Advanced Heart Failure Team  Pager [phone] (M-F; 7a - 5p)  Please contact [facility] for night-coverage after hours (5p -7a ) and weekends on [email or web address]

## 2023-01-28 NOTE — TOC Transition Note (Signed)
DISCHARGE MEDS (1)  FROM TOC LOCKED UP AT INPATIENT PHARMACY

## 2023-01-28 NOTE — Progress Notes (Signed)
Patient Name: [name]      SUBJECTIVE:feels better  Past Medical History:  Diagnosis Date   Angina    ASCVD (arteriosclerotic cardiovascular disease)    , Anterior infarction 2005, LAD diagonal bifurcation intervention 03/2004   Automatic implantable cardiac defibrillator -[employer]        Benign neoplasm of colon    CHF (congestive heart failure) (HCC)    Chronic systolic heart failure (HCC)    Coronary artery disease     Widely patent previously placed stents in the left anterior    Crohn's disease (HCC)    Deep venous thrombosis (HCC)    Recurrent-on Coumadin   Dialysis patient (HCC)    Dyspnea    Gastroparesis    GERD (gastroesophageal reflux disease)    High cholesterol    Hyperlipidemia    Hypersomnolent    Previous diagnosis of narcolepsy   Hypertension, essential    Ischemic cardiomyopathy    Ejection fraction 15-20% catheterization 2010   Type II or unspecified type diabetes mellitus without mention of complication, not stated as uncontrolled    Unspecified gastritis and gastroduodenitis without mention of hemorrhage     Scheduled Meds:  Scheduled Meds:  amiodarone  400 mg Oral BID   aspirin EC  81 mg Oral Daily   atorvastatin  40 mg Oral q1800   cefadroxil  1,000 mg Oral Once per day on Mon Wed Fri   Chlorhexidine Gluconate Cloth  6 each Topical Daily   [START ON 01/30/2023] cinacalcet  180 mg Oral Q M,W,F-HD   citalopram  20 mg Oral Daily   [START ON 01/30/2023] doxercalciferol  5 mcg Intravenous Q M,W,F-HD   fluticasone  1 spray Each Nare Daily   insulin aspart  0-9 Units Subcutaneous TID WC   loratadine  10 mg Oral QHS   metoCLOPramide  10 mg Oral TID   midodrine  10 mg Oral Q M,W,F   multivitamin  1 tablet Oral Daily   pantoprazole  40 mg Oral BID AC   quiNIDine sulfate  200 mg Oral BID   sodium chloride flush  3 mL Intravenous Q12H   Continuous Infusions:  sodium chloride     sodium chloride, acetaminophen, benzonatate,  docusate sodium, ondansetron (ZOFRAN) IV, sodium chloride flush    PHYSICAL EXAM Vitals:   01/28/23 1000 01/28/23 1100 01/28/23 1200 01/28/23 1300  BP: (!) 108/92 105/79 115/89 (!) 114/94  Pulse: (!) 182 66 68 (!) 30  Resp: 20 11 11    Temp:      TempSrc:      SpO2: 96% 100% 96% 97%  Weight:        Well developed and nourished in no acute distress HENT normal Neck supple   Clear Regular rate and rhythm, no murmurs or gallops Abd-soft with active BS No Clubbing cyanosis s/pBKA Skin-warm and dry A & Oriented  Grossly normal sensory and motor function  ECG (See Below)    TELEMETRY: Reviewed personnally pt in no VT:     Intake/Output Summary (Last 24 hours) at 01/28/2023 1401 Last data filed at 01/28/2023 0800 Gross per 24 hour  Intake 420 ml  Output --  Net 420 ml    LABS: Basic Metabolic Panel: Recent Labs  Lab 01/27/23 1124 01/27/23 1354 01/28/23 0408 01/28/23 0409  NA 135  --   --  135  K 3.3*  --   --  4.1  CL 95*  --   --  96*  CO2 29  --   --  26  GLUCOSE 54*  --   --  122*  BUN 11  --   --  19  CREATININE 3.80*  --   --  5.64*  CALCIUM 8.3*  --   --  9.2  MG  --  2.0  --  2.0  PHOS  --   --  4.8*  --    Cardiac Enzymes: No results for input(s): "CKTOTAL", "CKMB", "CKMBINDEX", "TROPONINI" in the last 72 hours. CBC: Recent Labs  Lab 01/27/23 1124 01/28/23 0409  WBC 5.5 4.6  HGB 10.0* 9.5*  HCT 31.3* 29.7*  MCV 87.2 87.6  PLT 127* 125*   PROTIME: Recent Labs    01/27/23 1124 01/28/23 0409  LABPROT 32.6* 33.3*  INR 3.2* 3.2*   Liver Function Tests: Recent Labs    01/27/23 1124  AST 51*  ALT 32  ALKPHOS 92  BILITOT 0.8  PROT 7.4  ALBUMIN 3.2*    Hemoglobin A1C: Recent Labs    01/27/23 1354  HGBA1C 6.3*     Device Interrogation:no overnight shock    ASSESSMENT AND PLAN:  Principal Problem:   VT (ventricular tachycardia) (HCC)  Recurrent VT  Have added quinidine tolerating QTs be ECG reading is wrong  QT measured  about 520 msec with JT about 420 msec Continue quinidine Will need periodic differential as can cause neutropenia   Device reprogrammed for (hopefully) more effective VT ATP  Signed, Sherryl Manges MD  01/28/2023

## 2023-01-28 NOTE — Progress Notes (Addendum)
Patient ID: [name], male   DOB: 1965/12/12, 57 y.o.   MRN: [id number]  [facility] Progress Note   Assessment/ Plan:   1.  Refractory ventricular tachycardia: Persistent despite of ongoing treatment with amiodarone and mexiletine resulting in periodic ICD shocks.  Yesterday was switched from mexiletine to quinidine along with continued amiodarone to help improve V. tach control. 2.  Severe ischemic cardiomyopathy/congestive heart failure with reduced ejection fraction: Status post HeartMate 3 LVAD and ongoing anticoagulation with Coumadin and blood pressure support with midodrine.  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 3.  End-stage renal disease: Usually on a Monday/Wednesday/Friday dialysis schedule and underwent hemodialysis with intradialytic hypotension/ventricular tachycardia earlier today.  He does not have any acute dialysis indication at this time and anticipate his next dialysis treatment will be Monday 5/27. 4.  Anemia: Hemoglobin/hematocrit acceptable at this time, will continue to monitor trend to decide on need to adjust ESA. 5.  Secondary hyperparathyroidism: Phosphorus level added onto labs from this morning.  Calcium level remains at goal; restarted 3 times weekly Hectorol and Sensipar. 6.  Nutrition: Continue renal diet/fluid restriction with ongoing protein supplementation.  Continue renal MVI.  Subjective:   No ventricular tachycardia overnight.  Denies any chest pain or shortness of breath at this time.   Objective:   BP 113/85   Pulse 61   Temp (!) 97.4 F (36.3 C) (Oral)   Resp 16   Wt 80.3 kg   SpO2 96%   BMI 26.92 kg/m   Physical Exam: Gen: Comfortably resting in bed, awake and alert.  Pleasant disposition as always CVS: Pulse regular rhythm with normal rate, LVAD hum audible over the precordium Resp: Clear to auscultation bilaterally, no distinct rales or rhonchi Abd: Soft, obese,  nontender, bowel sounds normal Ext: Status post bilateral AKA  Labs: BMET Recent Labs  Lab 01/27/23 1124 01/28/23 0409  NA 135 135  K 3.3* 4.1  CL 95* 96*  CO2 29 26  GLUCOSE 54* 122*  BUN 11 19  CREATININE 3.80* 5.64*  CALCIUM 8.3* 9.2   CBC Recent Labs  Lab 01/27/23 1124 01/28/23 0409  WBC 5.5 4.6  HGB 10.0* 9.5*  HCT 31.3* 29.7*  MCV 87.2 87.6  PLT 127* 125*      Medications:     amiodarone  400 mg Oral BID   aspirin EC  81 mg Oral Daily   atorvastatin  40 mg Oral q1800   calcitRIOL  0.5 mcg Oral Q M,W,F   cefadroxil  1,000 mg Oral Once per day on Mon Wed Fri   Chlorhexidine Gluconate Cloth  6 each Topical Daily   citalopram  20 mg Oral Daily   fluticasone  1 spray Each Nare Daily   insulin aspart  0-9 Units Subcutaneous TID WC   loratadine  10 mg Oral QHS   metoCLOPramide  10 mg Oral TID   midodrine  10 mg Oral Q M,W,F   multivitamin  1 tablet Oral Daily   pantoprazole  40 mg Oral BID AC   quiNIDine sulfate  200 mg Oral BID   sodium chloride flush  3 mL Intravenous Q12H   Zetta Bills, MD 01/28/2023, 7:56 AM

## 2023-01-29 DIAGNOSIS — I472 Ventricular tachycardia, unspecified: Secondary | ICD-10-CM | POA: Diagnosis not present

## 2023-01-29 DIAGNOSIS — I5020 Unspecified systolic (congestive) heart failure: Secondary | ICD-10-CM | POA: Diagnosis not present

## 2023-01-29 LAB — BASIC METABOLIC PANEL
Anion gap: 11 (ref 5–15)
BUN: 35 mg/dL — ABNORMAL HIGH (ref 6–20)
CO2: 27 mmol/L (ref 22–32)
Calcium: 9.3 mg/dL (ref 8.9–10.3)
Chloride: 94 mmol/L — ABNORMAL LOW (ref 98–111)
Creatinine, Ser: 7.39 mg/dL — ABNORMAL HIGH (ref 0.61–1.24)
GFR, Estimated: 8 mL/min — ABNORMAL LOW (ref >60)
Glucose, Bld: 155 mg/dL — ABNORMAL HIGH (ref 70–99)
Potassium: 4.4 mmol/L (ref 3.5–5.1)
Sodium: 132 mmol/L — ABNORMAL LOW (ref 135–145)

## 2023-01-29 LAB — CBC
HCT: 29.3 % — ABNORMAL LOW (ref 39.0–52.0)
Hemoglobin: 9.4 g/dL — ABNORMAL LOW (ref 13.0–17.0)
MCH: 27.8 pg (ref 26.0–34.0)
MCHC: 32.1 g/dL (ref 30.0–36.0)
MCV: 86.7 fL (ref 80.0–100.0)
Platelets: 139 10*3/uL — ABNORMAL LOW (ref 150–400)
RBC: 3.38 MIL/uL — ABNORMAL LOW (ref 4.22–5.81)
RDW: 17.2 % — ABNORMAL HIGH (ref 11.5–15.5)
WBC: 5.2 10*3/uL (ref 4.0–10.5)
nRBC: 0 % (ref 0.0–0.2)

## 2023-01-29 LAB — GLUCOSE, CAPILLARY
Glucose-Capillary: 116 mg/dL — ABNORMAL HIGH (ref 70–99)
Glucose-Capillary: 119 mg/dL — ABNORMAL HIGH (ref 70–99)
Glucose-Capillary: 106 mg/dL — ABNORMAL HIGH (ref 70–99)

## 2023-01-29 LAB — MAGNESIUM: Magnesium: 2.2 mg/dL (ref 1.7–2.4)

## 2023-01-29 LAB — PROTIME-INR
INR: 3.6 — ABNORMAL HIGH (ref 0.8–1.2)
Prothrombin Time: 36.2 seconds — ABNORMAL HIGH (ref 11.4–15.2)

## 2023-01-29 LAB — HEPATITIS B SURFACE ANTIGEN: Hepatitis B Surface Ag: NONREACTIVE

## 2023-01-29 LAB — MRSA NEXT GEN BY PCR, NASAL: MRSA by PCR Next Gen: NOT DETECTED

## 2023-01-29 LAB — LACTATE DEHYDROGENASE: LDH: 158 U/L (ref 98–192)

## 2023-01-29 NOTE — Progress Notes (Signed)
Advanced Heart Failure VAD Team Note  PCP-Cardiologist: None   Subjective:    Remains on amio and quinidine. No further VT. QT stable  Feels good. No CP or SOB.    LVAD INTERROGATION:  HeartMate 3 LVAD:   Flow 3.1 liters/min, speed 5300, power 4.0, PI 5.8    Objective:    Vital Signs:   Temp:  [97.6 F (36.4 C)-97.9 F (36.6 C)] 97.7 F (36.5 C) (05/26 0815) Pulse Rate:  [30-132] 94 (05/26 0700) Resp:  [10-22] 14 (05/26 0700) BP: (96-136)/(73-113) 113/85 (05/26 0600) SpO2:  [90 %-100 %] 96 % (05/26 0700) Weight:  [82.4 kg] 82.4 kg (05/26 0600) Last BM Date : 01/26/23 Mean arterial Pressure 90-100  Intake/Output:   Intake/Output Summary (Last 24 hours) at 01/29/2023 1008 Last data filed at 01/28/2023 2200 Gross per 24 hour  Intake 240 ml  Output --  Net 240 ml      Physical Exam    General:  Sitting up in bed No resp difficulty HEENT: normal  Neck: supple. JVP 8-9 Carotids 2+ bilat; no bruits. No lymphadenopathy or thryomegaly appreciated. Cor: LVAD hum.  Lungs: Clear. Abdomen: obese soft, nontender, non-distended. No hepatosplenomegaly. No bruits or masses. Good bowel sounds. Driveline site clean. Anchor in place.  Extremities: no cyanosis, clubbing, rash. S/p B AKA Neuro: alert & oriented x 3. No focal deficits. Moves all 4 without problem    Telemetry   AV paced 60s Personally reviewed  Labs   Basic Metabolic Panel: Recent Labs  Lab 01/27/23 1124 01/27/23 1354 01/28/23 0408 01/28/23 0409 01/29/23 0110  NA 135  --   --  135 132*  K 3.3*  --   --  4.1 4.4  CL 95*  --   --  96* 94*  CO2 29  --   --  26 27  GLUCOSE 54*  --   --  122* 155*  BUN 11  --   --  19 35*  CREATININE 3.80*  --   --  5.64* 7.39*  CALCIUM 8.3*  --   --  9.2 9.3  MG  --  2.0  --  2.0 2.2  PHOS  --   --  4.8*  --   --      Liver Function Tests: Recent Labs  Lab 01/27/23 1124  AST 51*  ALT 32  ALKPHOS 92  BILITOT 0.8  PROT 7.4  ALBUMIN 3.2*    No results  for input(s): "LIPASE", "AMYLASE" in the last 168 hours. No results for input(s): "AMMONIA" in the last 168 hours.  CBC: Recent Labs  Lab 01/27/23 1124 01/28/23 0409 01/29/23 0110  WBC 5.5 4.6 5.2  HGB 10.0* 9.5* 9.4*  HCT 31.3* 29.7* 29.3*  MCV 87.2 87.6 86.7  PLT 127* 125* 139*     INR: Recent Labs  Lab 01/24/23 0000 01/27/23 1124 01/28/23 0409 01/29/23 0110  INR 3.0 3.2* 3.2* 3.6*     Other results:    Imaging   No results found.   Medications:     Scheduled Medications:  amiodarone  400 mg Oral BID   aspirin EC  81 mg Oral Daily   atorvastatin  40 mg Oral q1800   cefadroxil  1,000 mg Oral Once per day on Mon Wed Fri   Chlorhexidine Gluconate Cloth  6 each Topical Daily   [START ON 01/30/2023] cinacalcet  180 mg Oral Q M,W,F-HD   citalopram  20 mg Oral Daily   [START ON 01/30/2023] doxercalciferol  5 mcg Intravenous Q M,W,F-HD   fluticasone  1 spray Each Nare Daily   insulin aspart  0-9 Units Subcutaneous TID WC   loratadine  10 mg Oral QHS   metoCLOPramide  10 mg Oral TID   midodrine  10 mg Oral Q M,W,F   multivitamin  1 tablet Oral Daily   pantoprazole  40 mg Oral BID AC   quiNIDine sulfate  200 mg Oral BID   sodium chloride flush  3 mL Intravenous Q12H   Warfarin - Pharmacist Dosing Inpatient   Does not apply q1600    Infusions:  sodium chloride      PRN Medications: sodium chloride, acetaminophen, benzonatate, docusate sodium, ondansetron (ZOFRAN) IV, sodium chloride flush   Assessment/Plan:    1. Refractory VT - VT with multiple ATPs over the last 24 hours.  - Presented to ED in V on 5/24. 3 low flows on VAD interrogation  - EP consulted . Device adjusted  - ATP in ED with conversion.  - Continue oral amiodarone + quinidine per EP.  - QTc ok on ECG - EP following   2. HFrEF. , HMIII, ICM  St Jude ICD.  NSTEMI in March 2018 with DES to LAD and RCA, complicated by cardiogenic shock, low output requiring milrinone.  Echo 8/18 with EF  20-25%, moderate MR. CPX (8/18) with severe functional impairment due to HF.  He is s/p HM3 LVAD placement. He had pump thrombosis 6/19 due to [facility] outflow graft kinking.  He had pump exchange for another HM3.  LVAD parameters stable but he does have PI events and low flow events at times with HD. He takes midodrine pre-HD, but needs hydralazine on non-HD days.  - Had Ramp Echo 5/21. Speed decreased to 5300 - Volume managed per HD. Renal following - Continue midodrine 10 mg tid.  - On warfarin. INR 3.6 Discussed dosing with PharmD personally.  3. Driveline infection, chronic  - Continue cefadroxil long term   - Stable. No f/c  4. ESRD  - IHD. M-W-F  - Nephrology following. Planning iHD tomorrow   5. PAD - S/P bilateral AKA   6. Diabetic Gastroparesis He has seen gastroparesis specialist at [facility]. Surprisingly, gastric emptying study was normal. However, electrogastrogram showed poor gastric accomodation/capacity. Therefore, small frequent meals recommended.  He has been doing much better with this, minimal symptoms.  - Continue reglan 10 mg tid/ac, now off Marinol.  - Continue Protonix bid.   7. DMII - cover with SSI   I reviewed the LVAD parameters from today, and compared the results to the patient's prior recorded data.  No programming changes were made.  The LVAD is functioning within specified parameters.  The patient performs LVAD self-test daily.  LVAD interrogation was negative for any significant power changes, alarms or PI events/speed drops.  LVAD equipment check completed and is in good working order.  Back-up equipment present.   LVAD education done on emergency procedures and precautions and reviewed exit site care.  Length of Stay: 2  Arvilla Meres, MD 01/29/2023, 10:08 AM  VAD Team --- VAD ISSUES ONLY--- Pager [phone] (7am - 7am)  Advanced Heart Failure Team  Pager [phone] (M-F; 7a - 5p)  Please contact [facility] for night-coverage after hours (5p  -7a ) and weekends on [email or web address]

## 2023-01-29 NOTE — Progress Notes (Signed)
Patient ID: [name], male   DOB: 03-14-1966, 57 y.o.   MRN: [id number] [facility] Progress Note   Assessment/ Plan:   1.  Refractory ventricular tachycardia: Persistent despite of ongoing treatment with amiodarone and mexiletine resulting in periodic ICD shocks.  Transition from mexiletine to quinidine to improve V. tach control.  Appears to be tolerating this well and recommendations for periodic CBC (which will be done monthly at dialysis) noted. 2.  Severe ischemic cardiomyopathy/congestive heart failure with reduced ejection fraction: Status post HeartMate 3 LVAD and ongoing anticoagulation with Coumadin and blood pressure support with midodrine.  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 3.  End-stage renal disease: Usually on a Monday/Wednesday/Friday dialysis schedule and orders placed for dialysis again tomorrow.  Per charting he is net positive only 900 cc since admission however weight up by almost 2.5 kg.  Will attempt ultrafiltration of 2.5 L tomorrow. 4.  Anemia: Hemoglobin/hematocrit acceptable at this time, will continue to monitor trend to decide on need to adjust ESA. 5.  Secondary hyperparathyroidism: Phosphorus level added onto labs from this morning.  Calcium level remains at goal; restarted 3 times weekly Hectorol and Sensipar. 6.  Nutrition: Continue renal diet/fluid restriction with ongoing protein supplementation.  Continue renal MVI.  Subjective:   Without acute events overnight including recurrent episode of ventricular tachycardia.   Objective:   BP 113/85   Pulse 94   Temp 97.6 F (36.4 C) (Oral)   Resp 14   Wt 82.4 kg   SpO2 96%   BMI 27.62 kg/m   Physical Exam: Gen: Appears comfortable resting in bed, awakens easily to conversation CVS: Pulse regular rhythm with normal rate, LVAD hum audible over the precordium Resp: Clear to auscultation bilaterally, no distinct rales or  rhonchi Abd: Soft, obese, nontender, bowel sounds normal Ext: Status post bilateral AKA  Labs: BMET Recent Labs  Lab 01/27/23 1124 01/28/23 0408 01/28/23 0409 01/29/23 0110  NA 135  --  135 132*  K 3.3*  --  4.1 4.4  CL 95*  --  96* 94*  CO2 29  --  26 27  GLUCOSE 54*  --  122* 155*  BUN 11  --  19 35*  CREATININE 3.80*  --  5.64* 7.39*  CALCIUM 8.3*  --  9.2 9.3  PHOS  --  4.8*  --   --    CBC Recent Labs  Lab 01/27/23 1124 01/28/23 0409 01/29/23 0110  WBC 5.5 4.6 5.2  HGB 10.0* 9.5* 9.4*  HCT 31.3* 29.7* 29.3*  MCV 87.2 87.6 86.7  PLT 127* 125* 139*      Medications:     amiodarone  400 mg Oral BID   aspirin EC  81 mg Oral Daily   atorvastatin  40 mg Oral q1800   cefadroxil  1,000 mg Oral Once per day on Mon Wed Fri   Chlorhexidine Gluconate Cloth  6 each Topical Daily   [START ON 01/30/2023] cinacalcet  180 mg Oral Q M,W,F-HD   citalopram  20 mg Oral Daily   [START ON 01/30/2023] doxercalciferol  5 mcg Intravenous Q M,W,F-HD   fluticasone  1 spray Each Nare Daily   insulin aspart  0-9 Units Subcutaneous TID WC   loratadine  10 mg Oral QHS   metoCLOPramide  10 mg Oral TID   midodrine  10 mg Oral Q M,W,F   multivitamin  1 tablet Oral Daily   pantoprazole  40 mg Oral  BID AC   quiNIDine sulfate  200 mg Oral BID   sodium chloride flush  3 mL Intravenous Q12H   Warfarin - Pharmacist Dosing Inpatient   Does not apply q1600   Zetta Bills, MD 01/29/2023, 8:15 AM

## 2023-01-29 NOTE — Progress Notes (Signed)
ANTICOAGULATION CONSULT NOTE - Follow Up Consult  Pharmacy Consult for warfarin Indication: LVAD  Allergies  Allergen Reactions   Metformin And Related Diarrhea    Patient Measurements: Weight: 82.4 kg (181 lb 10.5 oz)  Vital Signs: Temp: 97.9 F (36.6 C) (05/26 1143) Temp Source: Oral (05/26 1143) BP: 124/91 (05/26 1300) Pulse Rate: 61 (05/26 1100)  Labs: Recent Labs    01/27/23 1124 01/28/23 0409 01/29/23 0110  HGB 10.0* 9.5* 9.4*  HCT 31.3* 29.7* 29.3*  PLT 127* 125* 139*  LABPROT 32.6* 33.3* 36.2*  INR 3.2* 3.2* 3.6*  CREATININE 3.80* 5.64* 7.39*     Estimated Creatinine Clearance: 11.4 mL/min (A) (by C-G formula based on SCr of 7.39 mg/dL (H)).   Medical History: Past Medical History:  Diagnosis Date   Angina    ASCVD (arteriosclerotic cardiovascular disease)    , Anterior infarction 2005, LAD diagonal bifurcation intervention 03/2004   Automatic implantable cardiac defibrillator -St. Jude's        Benign neoplasm of colon    CHF (congestive heart failure) (HCC)    Chronic systolic heart failure (HCC)    Coronary artery disease     Widely patent previously placed stents in the left anterior    Crohn's disease (HCC)    Deep venous thrombosis (HCC)    Recurrent-on Coumadin   Dialysis patient (HCC)    Dyspnea    Gastroparesis    GERD (gastroesophageal reflux disease)    High cholesterol    Hyperlipidemia    Hypersomnolent    Previous diagnosis of narcolepsy   Hypertension, essential    Ischemic cardiomyopathy    Ejection fraction 15-20% catheterization 2010   Type II or unspecified type diabetes mellitus without mention of complication, not stated as uncontrolled    Unspecified gastritis and gastroduodenitis without mention of hemorrhage      Assessment: 57 yo male on chronic Coumadin 5 mg daily for LVAD, hx DVT with INR slightly above goal today at 3.5- will hold warfarin today  Warfarin dose held 5/24 >resumed 5/25 so INR didn't fall too low  but INR bump> hold 5/26 Hgb 10, Pltc 127.  No overt bleeding or complications noted.  Goal of Therapy:  INR 2-3 Monitor platelets by anticoagulation protocol: Yes   Plan:  No warfarin today Daily INR.   Leota Sauers Pharm.D. CPP, BCPS Clinical Pharmacist [phone] 01/29/2023 1:53 PM    [facility] pharmacy phone numbers are listed on [email or web address]

## 2023-01-30 ENCOUNTER — Telehealth: Payer: Self-pay | Admitting: *Deleted

## 2023-01-30 DIAGNOSIS — I472 Ventricular tachycardia, unspecified: Secondary | ICD-10-CM | POA: Diagnosis not present

## 2023-01-30 LAB — CBC
HCT: 29.7 % — ABNORMAL LOW (ref 39.0–52.0)
Hemoglobin: 9.3 g/dL — ABNORMAL LOW (ref 13.0–17.0)
MCH: 27.5 pg (ref 26.0–34.0)
MCHC: 31.3 g/dL (ref 30.0–36.0)
MCV: 87.9 fL (ref 80.0–100.0)
Platelets: 137 10*3/uL — ABNORMAL LOW (ref 150–400)
RBC: 3.38 MIL/uL — ABNORMAL LOW (ref 4.22–5.81)
RDW: 17.2 % — ABNORMAL HIGH (ref 11.5–15.5)
WBC: 6 10*3/uL (ref 4.0–10.5)
nRBC: 0 % (ref 0.0–0.2)

## 2023-01-30 LAB — PROTIME-INR
INR: 4.7 (ref 0.8–1.2)
Prothrombin Time: 44.7 seconds — ABNORMAL HIGH (ref 11.4–15.2)

## 2023-01-30 LAB — BASIC METABOLIC PANEL
Anion gap: 14 (ref 5–15)
BUN: 52 mg/dL — ABNORMAL HIGH (ref 6–20)
CO2: 24 mmol/L (ref 22–32)
Calcium: 9 mg/dL (ref 8.9–10.3)
Chloride: 95 mmol/L — ABNORMAL LOW (ref 98–111)
Creatinine, Ser: 9.23 mg/dL — ABNORMAL HIGH (ref 0.61–1.24)
GFR, Estimated: 6 mL/min — ABNORMAL LOW (ref >60)
Glucose, Bld: 110 mg/dL — ABNORMAL HIGH (ref 70–99)
Potassium: 4.6 mmol/L (ref 3.5–5.1)
Sodium: 133 mmol/L — ABNORMAL LOW (ref 135–145)

## 2023-01-30 LAB — GLUCOSE, CAPILLARY
Glucose-Capillary: 107 mg/dL — ABNORMAL HIGH (ref 70–99)
Glucose-Capillary: 81 mg/dL (ref 70–99)

## 2023-01-30 LAB — MAGNESIUM: Magnesium: 2.1 mg/dL (ref 1.7–2.4)

## 2023-01-30 LAB — LACTATE DEHYDROGENASE: LDH: 161 U/L (ref 98–192)

## 2023-01-30 MED ORDER — HEPARIN SODIUM (PORCINE) 1000 UNIT/ML DIALYSIS
7000.0000 [IU] | INTRAMUSCULAR | Status: DC | PRN
  Administered 2023-01-30: 7000 [IU] via INTRAVENOUS_CENTRAL
  Filled 2023-01-30 (×2): qty 7

## 2023-01-30 MED ORDER — ALTEPLASE 2 MG IJ SOLR: 2.0000 mg | Freq: Once | INTRAMUSCULAR | Status: DC | PRN

## 2023-01-30 MED ORDER — AMIODARONE HCL 400 MG PO TABS
400.0000 mg | ORAL_TABLET | Freq: Two times a day (BID) | ORAL | 6 refills | Status: DC
Start: 2023-01-30 — End: 2023-02-13

## 2023-01-30 MED ORDER — WARFARIN SODIUM 5 MG PO TABS: 5.0000 mg | ORAL_TABLET | Freq: Every day | ORAL | 11 refills | Status: DC

## 2023-01-30 MED ORDER — HEPARIN SODIUM (PORCINE) 1000 UNIT/ML DIALYSIS: 1000.0000 [IU] | INTRAMUSCULAR | Status: DC | PRN

## 2023-01-30 MED ORDER — ANTICOAGULANT SODIUM CITRATE 4% (200MG/5ML) IV SOLN: 5.0000 mL | Status: DC | PRN

## 2023-01-30 MED ORDER — HEPARIN SODIUM (PORCINE) 1000 UNIT/ML IJ SOLN
INTRAMUSCULAR | Status: AC
  Administered 2023-01-30: 3400 [IU]
  Filled 2023-01-30: qty 4

## 2023-01-30 NOTE — Plan of Care (Signed)
[facility] Patient Discharge Orders- [facility] CLINIC: [facility]  Patient's name: [name] Admit/DC Dates: 01/27/2023 - 01/30/2023  Discharge Diagnoses: Refractory VT   Severe CM/HFrEF with LVAD  Aranesp: Given: No    Date and amount of last dose: --  Last Hgb: 9.3 PRBC's Given: No Date/# of units: -- ESA dose for discharge: Mircera 60 mcg IV q 2 weeks  IV Iron dose at discharge: --  Heparin change: No   EDW Change: No  New EDW:   [place] Change: N/A   Access intervention/Change: N/A Details:  Hectorol/Calcitriol change: N/A  Discharge Labs: Calcium 9.0 Phosphorus -- Albumin 3.2 K+ 4.6  IV Antibiotics: N/A Details:  On Coumadin?: Yes, held dose today  Last INR: 4.7 Next INR: Managed By: [facility]    OTHER/APPTS/LAB ORDERS:    D/C Meds to be reconciled by nurse after every discharge.  Completed By: Tomasa Blase PA-C [facility] 01/30/2023,3:07 PM   Reviewed by: MD:______ RN_______

## 2023-01-30 NOTE — Telephone Encounter (Signed)
Left voicemail regarding follow up appt. Hospital d/c f/u appt 02/07/23 at 11:00 per Dr Shirlee Latch. (Canceled appt 5/28). Requested call back with any questions.   Appt information added to outpatient navigator AVS.   Alyce Pagan RN VAD Coordinator  Office: [phone]  24/7 Pager: [phone]

## 2023-01-30 NOTE — Procedures (Signed)
HD Note:  Some information was entered later than the data was gathered due to patient care needs. The stated time with the data is accurate.  Treatment done at bedside Alert and oriented.  Informed consent signed and in chart.   TX duration: 4 hours  Patient tolerated well.    Alert, without acute distress.  Hand-off given to patient's nurse.   Access used: Left upper chest HD catheter Access issues: None  Total UF removed: 2500 ml Medication(s) given: per order, see MAR    [name]

## 2023-01-30 NOTE — Progress Notes (Signed)
ANTICOAGULATION CONSULT NOTE - Follow Up Consult  Pharmacy Consult for warfarin Indication: LVAD  Allergies  Allergen Reactions   Metformin And Related Diarrhea    Patient Measurements: Weight: 82.1 kg (181 lb)  Vital Signs: Temp: 97.9 F (36.6 C) (05/27 0730) Temp Source: Oral (05/27 0730) BP: 107/83 (05/27 1216) Pulse Rate: 108 (05/27 1216)  Labs: Recent Labs    01/28/23 0409 01/29/23 0110 01/30/23 0106  HGB 9.5* 9.4* 9.3*  HCT 29.7* 29.3* 29.7*  PLT 125* 139* 137*  LABPROT 33.3* 36.2* 44.7*  INR 3.2* 3.6* 4.7*  CREATININE 5.64* 7.39* 9.23*     Estimated Creatinine Clearance: 9.1 mL/min (A) (by C-G formula based on SCr of 9.23 mg/dL (H)).   Medical History: Past Medical History:  Diagnosis Date   Angina    ASCVD (arteriosclerotic cardiovascular disease)    , Anterior infarction 2005, LAD diagonal bifurcation intervention 03/2004   Automatic implantable cardiac defibrillator -St. Jude's        Benign neoplasm of colon    CHF (congestive heart failure) (HCC)    Chronic systolic heart failure (HCC)    Coronary artery disease     Widely patent previously placed stents in the left anterior    Crohn's disease (HCC)    Deep venous thrombosis (HCC)    Recurrent-on Coumadin   Dialysis patient (HCC)    Dyspnea    Gastroparesis    GERD (gastroesophageal reflux disease)    High cholesterol    Hyperlipidemia    Hypersomnolent    Previous diagnosis of narcolepsy   Hypertension, essential    Ischemic cardiomyopathy    Ejection fraction 15-20% catheterization 2010   Type II or unspecified type diabetes mellitus without mention of complication, not stated as uncontrolled    Unspecified gastritis and gastroduodenitis without mention of hemorrhage      Assessment: 57 yo male on chronic Coumadin 5 mg daily for LVAD, hx DVT  Admit INR 3.1 at top of goal   INR 3.6 - above goal yesterday and warfarin help today INR continue to trend up 4.7  No bleeding noted and  CBC stable  Will hold Warfarin again today    Goal of Therapy:  INR 2-3 Monitor platelets by anticoagulation protocol: Yes   Plan:  No warfarin today Check INR at follow up and redose warfarin    Leota Sauers Pharm.D. CPP, BCPS Clinical Pharmacist [phone] 01/30/2023 1:25 PM    [facility] pharmacy phone numbers are listed on [email or web address]

## 2023-01-30 NOTE — Progress Notes (Signed)
Patient ID: [name], male   DOB: 1966-08-03, 57 y.o.   MRN: [id number] [facility] Progress Note   Assessment/ Plan:   1.  Refractory ventricular tachycardia: Persistent despite of ongoing treatment with amiodarone and mexiletine resulting in periodic ICD shocks.  Transitioned from mexiletine to quinidine to improve V. tach control.  Appears to be tolerating well and recommendations for periodic CBC (which will be done monthly at dialysis) noted. For DC today.  2.  Severe ischemic cardiomyopathy/congestive heart failure with reduced ejection fraction: Status post HeartMate 3 LVAD and ongoing anticoagulation with Coumadin and blood pressure support with midodrine.  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 3.  End-stage renal disease: Usually on a Monday/Wednesday/Friday dialysis schedule and getting HD this am today.  4.  Anemia: Hemoglobin/hematocrit acceptable at this time, will continue to monitor trend to decide on need to adjust ESA. 5.  Secondary hyperparathyroidism: Phosphorus level added onto labs from this morning.  Calcium level remains at goal; restarted 3 times weekly Hectorol and Sensipar. 6.  Nutrition: Continue renal diet/fluid restriction with ongoing protein supplementation.  Continue renal MVI.  Vinson Moselle, MD 01/30/2023, 12:08 PM    Subjective:   No c/o's today, may be going home   Objective:   BP 106/88   Pulse 78   Temp 97.9 F (36.6 C) (Oral)   Resp 18   Wt 82.1 kg   SpO2 94%   BMI 27.52 kg/m   Physical Exam: Gen: Appears comfortable resting in bed, awakens easily to conversation CVS: Pulse regular rhythm with normal rate, LVAD hum audible over the precordium Resp: Clear to auscultation bilaterally, no distinct rales or rhonchi Abd: Soft, obese, nontender, bowel sounds normal Ext: Status post bilateral AKA  Labs: BMET Recent Labs  Lab 01/27/23 1124 01/28/23 0408 01/28/23 0409  01/29/23 0110 01/30/23 0106  NA 135  --  135 132* 133*  K 3.3*  --  4.1 4.4 4.6  CL 95*  --  96* 94* 95*  CO2 29  --  26 27 24   GLUCOSE 54*  --  122* 155* 110*  BUN 11  --  19 35* 52*  CREATININE 3.80*  --  5.64* 7.39* 9.23*  CALCIUM 8.3*  --  9.2 9.3 9.0  PHOS  --  4.8*  --   --   --     CBC Recent Labs  Lab 01/27/23 1124 01/28/23 0409 01/29/23 0110 01/30/23 0106  WBC 5.5 4.6 5.2 6.0  HGB 10.0* 9.5* 9.4* 9.3*  HCT 31.3* 29.7* 29.3* 29.7*  MCV 87.2 87.6 86.7 87.9  PLT 127* 125* 139* 137*       Medications:     amiodarone  400 mg Oral BID   aspirin EC  81 mg Oral Daily   atorvastatin  40 mg Oral q1800   cefadroxil  1,000 mg Oral Once per day on Mon Wed Fri   Chlorhexidine Gluconate Cloth  6 each Topical Daily   cinacalcet  180 mg Oral Q M,W,F-HD   citalopram  20 mg Oral Daily   doxercalciferol  5 mcg Intravenous Q M,W,F-HD   fluticasone  1 spray Each Nare Daily   heparin sodium (porcine)       insulin aspart  0-9 Units Subcutaneous TID WC   loratadine  10 mg Oral QHS   metoCLOPramide  10 mg Oral TID   midodrine  10 mg Oral Q M,W,F   multivitamin  1 tablet Oral Daily  pantoprazole  40 mg Oral BID AC   quiNIDine sulfate  200 mg Oral BID   sodium chloride flush  3 mL Intravenous Q12H   Warfarin - Pharmacist Dosing Inpatient   Does not apply q1600

## 2023-01-30 NOTE — Progress Notes (Signed)
Patient Name: [name]      SUBJECTIVE:without complaint  Past Medical History:  Diagnosis Date   Angina    ASCVD (arteriosclerotic cardiovascular disease)    , Anterior infarction 2005, LAD diagonal bifurcation intervention 03/2004   Automatic implantable cardiac defibrillator -[employer]        Benign neoplasm of colon    CHF (congestive heart failure) (HCC)    Chronic systolic heart failure (HCC)    Coronary artery disease     Widely patent previously placed stents in the left anterior    Crohn's disease (HCC)    Deep venous thrombosis (HCC)    Recurrent-on Coumadin   Dialysis patient (HCC)    Dyspnea    Gastroparesis    GERD (gastroesophageal reflux disease)    High cholesterol    Hyperlipidemia    Hypersomnolent    Previous diagnosis of narcolepsy   Hypertension, essential    Ischemic cardiomyopathy    Ejection fraction 15-20% catheterization 2010   Type II or unspecified type diabetes mellitus without mention of complication, not stated as uncontrolled    Unspecified gastritis and gastroduodenitis without mention of hemorrhage     Scheduled Meds:  Scheduled Meds:  amiodarone  400 mg Oral BID   aspirin EC  81 mg Oral Daily   atorvastatin  40 mg Oral q1800   cefadroxil  1,000 mg Oral Once per day on Mon Wed Fri   Chlorhexidine Gluconate Cloth  6 each Topical Daily   cinacalcet  180 mg Oral Q M,W,F-HD   citalopram  20 mg Oral Daily   doxercalciferol  5 mcg Intravenous Q M,W,F-HD   fluticasone  1 spray Each Nare Daily   heparin sodium (porcine)       insulin aspart  0-9 Units Subcutaneous TID WC   loratadine  10 mg Oral QHS   metoCLOPramide  10 mg Oral TID   midodrine  10 mg Oral Q M,W,F   multivitamin  1 tablet Oral Daily   pantoprazole  40 mg Oral BID AC   quiNIDine sulfate  200 mg Oral BID   sodium chloride flush  3 mL Intravenous Q12H   Warfarin - Pharmacist Dosing Inpatient   Does not apply q1600   Continuous Infusions:  sodium  chloride     anticoagulant sodium citrate     sodium chloride, acetaminophen, alteplase, anticoagulant sodium citrate, benzonatate, docusate sodium, heparin, heparin, heparin sodium (porcine), ondansetron (ZOFRAN) IV, sodium chloride flush    PHYSICAL EXAM Vitals:   01/30/23 0845 01/30/23 0900 01/30/23 0915 01/30/23 0930  BP: 99/78 109/82 (!) 111/99 109/79  Pulse: 63 73 68 64  Resp: 11 14 17 15   Temp:      TempSrc:      SpO2:      Weight:        Well developed and well nourished in no acute distress HENT normal Device pocket well healed; without hematoma or erythema.  There is no tethering  Regular rate and rhythm,   B AKA  Skin-warm and dry A & Oriented  Grossly normal sensory and motor function          ECG (See Below)    TELEMETRY: Reviewed personnally no VT     Intake/Output Summary (Last 24 hours) at 01/30/2023 0936 Last data filed at 01/29/2023 1700 Gross per 24 hour  Intake 240 ml  Output --  Net 240 ml     LABS: Basic Metabolic Panel: Recent Labs  Lab 01/27/23 1124 01/27/23 1354 01/28/23 0408 01/28/23 0409 01/29/23 0110 01/30/23 0106  NA 135  --   --  135 132* 133*  K 3.3*  --   --  4.1 4.4 4.6  CL 95*  --   --  96* 94* 95*  CO2 29  --   --  26 27 24   GLUCOSE 54*  --   --  122* 155* 110*  BUN 11  --   --  19 35* 52*  CREATININE 3.80*  --   --  5.64* 7.39* 9.23*  CALCIUM 8.3*  --   --  9.2 9.3 9.0  MG  --    < >  --  2.0 2.2 2.1  PHOS  --   --  4.8*  --   --   --    < > = values in this interval not displayed.    Cardiac Enzymes: No results for input(s): "CKTOTAL", "CKMB", "CKMBINDEX", "TROPONINI" in the last 72 hours. CBC: Recent Labs  Lab 01/27/23 1124 01/28/23 0409 01/29/23 0110 01/30/23 0106  WBC 5.5 4.6 5.2 6.0  HGB 10.0* 9.5* 9.4* 9.3*  HCT 31.3* 29.7* 29.3* 29.7*  MCV 87.2 87.6 86.7 87.9  PLT 127* 125* 139* 137*    PROTIME: Recent Labs    01/28/23 0409 01/29/23 0110 01/30/23 0106  LABPROT 33.3* 36.2* 44.7*  INR  3.2* 3.6* 4.7*    Liver Function Tests: Recent Labs    01/27/23 1124  AST 51*  ALT 32  ALKPHOS 92  BILITOT 0.8  PROT 7.4  ALBUMIN 3.2*     Hemoglobin A1C: Recent Labs    01/27/23 1354  HGBA1C 6.3*      Device Interrogation:no overnight shock    ASSESSMENT AND PLAN:  Principal Problem:   VT (ventricular tachycardia) (HCC)  No recurrent VT Continue quinidine 200 bid Continue amio 400 bid Device has been reprogrammed Ok to discharge   Signed, Sherryl Manges MD  01/30/2023

## 2023-01-31 ENCOUNTER — Telehealth: Payer: Self-pay

## 2023-01-31 ENCOUNTER — Encounter (HOSPITAL_COMMUNITY): Payer: Medicare Other | Admitting: Cardiology

## 2023-01-31 ENCOUNTER — Other Ambulatory Visit: Payer: Self-pay

## 2023-01-31 ENCOUNTER — Ambulatory Visit: Payer: Medicare Other | Attending: Internal Medicine | Admitting: Internal Medicine

## 2023-01-31 ENCOUNTER — Ambulatory Visit (HOSPITAL_COMMUNITY): Payer: Self-pay | Admitting: Pharmacist

## 2023-01-31 ENCOUNTER — Other Ambulatory Visit (HOSPITAL_COMMUNITY): Payer: Self-pay

## 2023-01-31 NOTE — Transitions of Care (Post Inpatient/ED Visit) (Signed)
01/31/2023  Name: [name] MRN: [id number] DOB: 1965/09/28  Today's TOC FU Call Status: Today's TOC FU Call Status:: Successful TOC FU Call Competed TOC FU Call Complete Date: 01/31/23  Transition Care Management Follow-up Telephone Call Date of Discharge: 01/30/23 Discharge Facility: [facility]) Type of Discharge: Inpatient Admission Primary Inpatient Discharge Diagnosis:: heart disease How have you been since you were released from the hospital?: Better Any questions or concerns?: No  Items Reviewed: Did you receive and understand the discharge instructions provided?: Yes Medications obtained,verified, and reconciled?: Yes (Medications Reviewed) Any new allergies since your discharge?: No Dietary orders reviewed?: Yes Do you have support at home?: No  Medications Reviewed Today: Medications Reviewed Today     Reviewed by Karena Addison, LPN (Licensed Practical Nurse) on 01/31/23 at [id number]  Med List Status: <None>   Medication Order Taking? Sig Documenting Provider Last Dose Status Informant  amiodarone (PACERONE) 400 MG tablet [id number] Yes Take 1 tablet (400 mg total) by mouth 2 (two) times daily. Take 400 mg (1 tablet) twice a day x 1 week starting 01/27/23. Then decrease to 400 mg (1 tablet) daily. Tonye Becket D, NP Taking Active   aspirin EC 81 MG tablet [id number] Yes Take 1 tablet (81 mg total) by mouth daily. Janetta Hora, PA-C Taking Active Self, Pharmacy Records  atorvastatin (LIPITOR) 40 MG tablet [id number] Yes Take 1 tablet (40 mg total) by mouth daily at 6 PM. Leeroy Bock, MD Taking Active Self, Pharmacy Records  calcitRIOL (ROCALTROL) 0.5 MCG capsule [id number] Yes Take 1 capsule (0.5 mcg total) by mouth every Monday, Wednesday, and Friday. Alford Highland, NP Taking Active Self, Pharmacy Records           Med Note [facility], [place]   Tue Aug 28, 2018  6:42 PM)    cefadroxil (DURICEF) 500 MG capsule [id number] Yes Take 2 capsules (1,000 mg  total) by mouth 3 (three) times a week. Take AFTER dialysis sessions Blanchard Kelch, NP Taking Active Self, Pharmacy Records  citalopram (CELEXA) 20 MG tablet [id number] Yes Take 1 tablet (20 mg total) by mouth daily. Alford Highland, NP Taking Active Self, Pharmacy Records           Med Note [facility], [facility]   Tue Aug 28, 2018  6:42 PM)    COLACE 100 MG capsule [id number] Yes Take 100 mg by mouth daily as needed for moderate constipation. [provider] Taking Active Self, Pharmacy Records  fluticasone [facility]) 50 MCG/ACT nasal spray [id number] Yes Place 1 spray into both nostrils daily. Omar Person, MD Taking Active Self, Pharmacy Records  hydrALAZINE (APRESOLINE) 25 MG tablet [id number] Yes Take 1 tablet three times a day on NON-dialysis days only Laurey Morale, MD Taking Active Self, Pharmacy Records  insulin glargine (LANTUS) 100 UNIT/ML injection [id number] Yes Inject 0.35 mLs (35 Units total) into the skin every morning. Bess Kinds, MD Taking Active Self, Pharmacy Records  levocetirizine (XYZAL) 5 MG tablet [id number] Yes Take 1 tablet (5 mg total) by mouth every evening. Omar Person, MD Taking Active Self, Pharmacy Records  LORazepam (ATIVAN) 0.5 MG tablet [id number] Yes Take 1 tablet (0.5 mg total) by mouth every 12 (twelve) hours as needed (nausea). Allayne Butcher, PA-C Taking Active Self, Pharmacy Records  metoCLOPramide (REGLAN) 10 MG tablet [id number] Yes Take 1 tablet (10 mg total) by mouth 3 (three) times daily.  Patient taking differently: Take 10 mg by mouth 3 (three) times daily  before meals.   Alford Highland, NP Taking Active Self, Pharmacy Records  midodrine [facility] Iv) 5 MG tablet [id number] Yes Take 2 tablets (10 mg total) by mouth every Monday, Wednesday, and Friday. Take 1 tablet before dialysis treatment Laurey Morale, MD Taking Active Self, Pharmacy Records  multivitamin (RENA-VIT) TABS tablet [id number] Yes Take 1 tablet by mouth daily.  [provider] Taking Active Self, Pharmacy Records  ondansetron (ZOFRAN-ODT) 4 MG disintegrating tablet [id number] Yes Take by mouth as needed. [provider] Taking Active Self, Pharmacy Records  oxyCODONE-acetaminophen (PERCOCET) 7.5-325 MG tablet [id number] Yes Take 1 tablet by mouth 3 (three) times daily as needed for pain. [provider] Taking Active Self, Pharmacy Records  pantoprazole (PROTONIX) 40 MG tablet [id number] Yes Take 1 tablet (40 mg total) by mouth 2 (two) times daily before lunch and supper. Barrett, Joline Salt, PA-C Taking Active Self, Pharmacy Records  promethazine (PHENERGAN) 12.5 MG tablet [id number] Yes TAKE 1 TABLET (12.5 MG TOTAL) BY MOUTH EVERY 6 (SIX) HOURS AS NEEDED FOR NAUSEA OR VOMITING. Laurey Morale, MD Taking Active Self, Pharmacy Records  quiNIDine sulfate 200 MG tablet [id number] Yes Take 1 tablet (200 mg total) by mouth 2 (two) times daily. Laurey Morale, MD Taking Active Self, Pharmacy Records  quiNIDine sulfate 200 MG tablet [id number] Yes Take 1 tablet (200 mg total) by mouth in the morning and at bedtime. Bensimhon, Bevelyn Buckles, MD Taking Active   sevelamer carbonate (RENVELA) 0.8 g PACK packet [id number] Yes Take 0.8 g by mouth 3 (three) times daily with meals. Manson Passey, PA Taking Active Self, Pharmacy Records  triamcinolone ointment (KENALOG) 0.1 % [id number] Yes Apply 1 Application topically 2 (two) times daily. Bess Kinds, MD Taking Active Self, Pharmacy Records  warfarin (COUMADIN) 5 MG tablet [id number] Yes Take 1 tablet (5 mg total) by mouth daily. Tonye Becket D, NP Taking Active             Home Care and Equipment/Supplies: Were Home Health Services Ordered?: NA Any new equipment or medical supplies ordered?: NA  Functional Questionnaire: Do you need assistance with bathing/showering or dressing?: No Do you need assistance with meal preparation?: No Do you need assistance with eating?: No Do you have  difficulty maintaining continence: No Do you need assistance with getting out of bed/getting out of a chair/moving?: No Do you have difficulty managing or taking your medications?: No  Follow up appointments reviewed: PCP Follow-up appointment confirmed?: NA Specialist Hospital Follow-up appointment confirmed?: Yes Date of Specialist follow-up appointment?: 02/07/23 Follow-Up Specialty Provider:: [facility] Do you need transportation to your follow-up appointment?: No Do you understand care options if your condition(s) worsen?: Yes-patient verbalized understanding    SIGNATURE Karena Addison, LPN [facility] Nurse Health Advisor Direct Dial [phone]

## 2023-02-01 ENCOUNTER — Encounter: Payer: Self-pay | Admitting: Internal Medicine

## 2023-02-01 ENCOUNTER — Other Ambulatory Visit: Payer: Self-pay

## 2023-02-01 ENCOUNTER — Other Ambulatory Visit (HOSPITAL_COMMUNITY): Payer: Self-pay

## 2023-02-01 LAB — HEPATITIS B SURFACE ANTIBODY, QUANTITATIVE: Hep B S AB Quant (Post): 326 m[IU]/mL (ref >9.9)

## 2023-02-02 ENCOUNTER — Telehealth (HOSPITAL_COMMUNITY): Payer: Self-pay

## 2023-02-02 NOTE — Telephone Encounter (Signed)
They were not sure this was reported, but a critical INR on the 28th of 5.3

## 2023-02-03 ENCOUNTER — Emergency Department (HOSPITAL_COMMUNITY): Payer: Medicare Other

## 2023-02-03 ENCOUNTER — Encounter (HOSPITAL_COMMUNITY): Payer: Self-pay

## 2023-02-03 ENCOUNTER — Inpatient Hospital Stay (HOSPITAL_COMMUNITY)
Admission: EM | Admit: 2023-02-03 | Discharge: 2023-02-13 | DRG: 291 | Disposition: A | Discharge status: Home / Self Care | Payer: Medicare Other | Attending: Internal Medicine | Admitting: Internal Medicine

## 2023-02-03 DIAGNOSIS — Z992 Dependence on renal dialysis: Secondary | ICD-10-CM | POA: Diagnosis not present

## 2023-02-03 DIAGNOSIS — Z95810 Presence of automatic (implantable) cardiac defibrillator: Secondary | ICD-10-CM

## 2023-02-03 DIAGNOSIS — E669 Obesity, unspecified: Secondary | ICD-10-CM | POA: Diagnosis present

## 2023-02-03 DIAGNOSIS — Z89612 Acquired absence of left leg above knee: Secondary | ICD-10-CM

## 2023-02-03 DIAGNOSIS — I472 Ventricular tachycardia, unspecified: Secondary | ICD-10-CM | POA: Diagnosis present

## 2023-02-03 DIAGNOSIS — D631 Anemia in chronic kidney disease: Secondary | ICD-10-CM | POA: Diagnosis present

## 2023-02-03 DIAGNOSIS — Z86718 Personal history of other venous thrombosis and embolism: Secondary | ICD-10-CM

## 2023-02-03 DIAGNOSIS — E11649 Type 2 diabetes mellitus with hypoglycemia without coma: Secondary | ICD-10-CM | POA: Diagnosis present

## 2023-02-03 DIAGNOSIS — Z95811 Presence of heart assist device: Secondary | ICD-10-CM | POA: Diagnosis not present

## 2023-02-03 DIAGNOSIS — I517 Cardiomegaly: Secondary | ICD-10-CM | POA: Diagnosis not present

## 2023-02-03 DIAGNOSIS — R4182 Altered mental status, unspecified: Secondary | ICD-10-CM | POA: Diagnosis present

## 2023-02-03 DIAGNOSIS — E1143 Type 2 diabetes mellitus with diabetic autonomic (poly)neuropathy: Secondary | ICD-10-CM | POA: Diagnosis present

## 2023-02-03 DIAGNOSIS — K219 Gastro-esophageal reflux disease without esophagitis: Secondary | ICD-10-CM | POA: Diagnosis present

## 2023-02-03 DIAGNOSIS — R7989 Other specified abnormal findings of blood chemistry: Secondary | ICD-10-CM | POA: Diagnosis present

## 2023-02-03 DIAGNOSIS — N186 End stage renal disease: Secondary | ICD-10-CM | POA: Diagnosis present

## 2023-02-03 DIAGNOSIS — E1151 Type 2 diabetes mellitus with diabetic peripheral angiopathy without gangrene: Secondary | ICD-10-CM | POA: Diagnosis present

## 2023-02-03 DIAGNOSIS — I255 Ischemic cardiomyopathy: Secondary | ICD-10-CM | POA: Diagnosis present

## 2023-02-03 DIAGNOSIS — Z823 Family history of stroke: Secondary | ICD-10-CM

## 2023-02-03 DIAGNOSIS — I252 Old myocardial infarction: Secondary | ICD-10-CM

## 2023-02-03 DIAGNOSIS — I953 Hypotension of hemodialysis: Secondary | ICD-10-CM | POA: Diagnosis present

## 2023-02-03 DIAGNOSIS — I5022 Chronic systolic (congestive) heart failure: Secondary | ICD-10-CM | POA: Diagnosis present

## 2023-02-03 DIAGNOSIS — Z79899 Other long term (current) drug therapy: Secondary | ICD-10-CM

## 2023-02-03 DIAGNOSIS — Z794 Long term (current) use of insulin: Secondary | ICD-10-CM

## 2023-02-03 DIAGNOSIS — Z515 Encounter for palliative care: Secondary | ICD-10-CM | POA: Diagnosis not present

## 2023-02-03 DIAGNOSIS — Z89611 Acquired absence of right leg above knee: Secondary | ICD-10-CM | POA: Diagnosis not present

## 2023-02-03 DIAGNOSIS — Z7982 Long term (current) use of aspirin: Secondary | ICD-10-CM

## 2023-02-03 DIAGNOSIS — E876 Hypokalemia: Secondary | ICD-10-CM | POA: Diagnosis present

## 2023-02-03 DIAGNOSIS — I9589 Other hypotension: Secondary | ICD-10-CM | POA: Diagnosis present

## 2023-02-03 DIAGNOSIS — Z9049 Acquired absence of other specified parts of digestive tract: Secondary | ICD-10-CM

## 2023-02-03 DIAGNOSIS — I50810 Right heart failure, unspecified: Secondary | ICD-10-CM | POA: Diagnosis present

## 2023-02-03 DIAGNOSIS — I351 Nonrheumatic aortic (valve) insufficiency: Secondary | ICD-10-CM | POA: Diagnosis not present

## 2023-02-03 DIAGNOSIS — E7800 Pure hypercholesterolemia, unspecified: Secondary | ICD-10-CM | POA: Diagnosis present

## 2023-02-03 DIAGNOSIS — I2510 Atherosclerotic heart disease of native coronary artery without angina pectoris: Secondary | ICD-10-CM | POA: Diagnosis present

## 2023-02-03 DIAGNOSIS — N3090 Cystitis, unspecified without hematuria: Secondary | ICD-10-CM | POA: Diagnosis present

## 2023-02-03 DIAGNOSIS — E1122 Type 2 diabetes mellitus with diabetic chronic kidney disease: Secondary | ICD-10-CM | POA: Diagnosis present

## 2023-02-03 DIAGNOSIS — I132 Hypertensive heart and chronic kidney disease with heart failure and with stage 5 chronic kidney disease, or end stage renal disease: Secondary | ICD-10-CM | POA: Diagnosis present

## 2023-02-03 DIAGNOSIS — T829XXA Unspecified complication of cardiac and vascular prosthetic device, implant and graft, initial encounter: Principal | ICD-10-CM

## 2023-02-03 DIAGNOSIS — K3184 Gastroparesis: Secondary | ICD-10-CM | POA: Diagnosis present

## 2023-02-03 DIAGNOSIS — I5084 End stage heart failure: Secondary | ICD-10-CM | POA: Diagnosis present

## 2023-02-03 DIAGNOSIS — Z833 Family history of diabetes mellitus: Secondary | ICD-10-CM

## 2023-02-03 DIAGNOSIS — Z8249 Family history of ischemic heart disease and other diseases of the circulatory system: Secondary | ICD-10-CM

## 2023-02-03 DIAGNOSIS — Z7901 Long term (current) use of anticoagulants: Secondary | ICD-10-CM

## 2023-02-03 DIAGNOSIS — Z792 Long term (current) use of antibiotics: Secondary | ICD-10-CM

## 2023-02-03 LAB — CBC WITH DIFFERENTIAL/PLATELET
Abs Immature Granulocytes: 0.07 10*3/uL (ref 0.00–0.07)
Basophils Absolute: 0 10*3/uL (ref 0.0–0.1)
Basophils Relative: 0 %
Eosinophils Absolute: 0.4 10*3/uL (ref 0.0–0.5)
Eosinophils Relative: 5 %
HCT: 26.9 % — ABNORMAL LOW (ref 39.0–52.0)
Hemoglobin: 8.8 g/dL — ABNORMAL LOW (ref 13.0–17.0)
Immature Granulocytes: 1 %
Lymphocytes Relative: 9 %
Lymphs Abs: 0.7 10*3/uL (ref 0.7–4.0)
MCH: 28.8 pg (ref 26.0–34.0)
MCHC: 32.7 g/dL (ref 30.0–36.0)
MCV: 87.9 fL (ref 80.0–100.0)
Monocytes Absolute: 0.6 10*3/uL (ref 0.1–1.0)
Monocytes Relative: 7 %
Neutro Abs: 6.1 10*3/uL (ref 1.7–7.7)
Neutrophils Relative %: 78 %
Platelets: 127 10*3/uL — ABNORMAL LOW (ref 150–400)
RBC: 3.06 MIL/uL — ABNORMAL LOW (ref 4.22–5.81)
RDW: 17.3 % — ABNORMAL HIGH (ref 11.5–15.5)
WBC: 7.9 10*3/uL (ref 4.0–10.5)
nRBC: 0 % (ref 0.0–0.2)

## 2023-02-03 LAB — PROTIME-INR
INR: 4.6 (ref 0.8–1.2)
INR: 3.4 — ABNORMAL HIGH (ref 0.8–1.2)
Prothrombin Time: 43.5 seconds — ABNORMAL HIGH (ref 11.4–15.2)
Prothrombin Time: 34.3 seconds — ABNORMAL HIGH (ref 11.4–15.2)

## 2023-02-03 LAB — COMPREHENSIVE METABOLIC PANEL
ALT: 32 U/L (ref 0–44)
AST: 36 U/L (ref 15–41)
Albumin: 2.7 g/dL — ABNORMAL LOW (ref 3.5–5.0)
Alkaline Phosphatase: 78 U/L (ref 38–126)
Anion gap: 12 (ref 5–15)
BUN: 20 mg/dL (ref 6–20)
CO2: 27 mmol/L (ref 22–32)
Calcium: 7.6 mg/dL — ABNORMAL LOW (ref 8.9–10.3)
Chloride: 93 mmol/L — ABNORMAL LOW (ref 98–111)
Creatinine, Ser: 6.13 mg/dL — ABNORMAL HIGH (ref 0.61–1.24)
GFR, Estimated: 10 mL/min — ABNORMAL LOW (ref >60)
Glucose, Bld: 63 mg/dL — ABNORMAL LOW (ref 70–99)
Potassium: 2.7 mmol/L — CL (ref 3.5–5.1)
Sodium: 132 mmol/L — ABNORMAL LOW (ref 135–145)
Total Bilirubin: 0.8 mg/dL (ref 0.3–1.2)
Total Protein: 5.9 g/dL — ABNORMAL LOW (ref 6.5–8.1)

## 2023-02-03 LAB — I-STAT VENOUS BLOOD GAS, ED
Acid-Base Excess: 6 mmol/L — ABNORMAL HIGH (ref 0.0–2.0)
Bicarbonate: 31.5 mmol/L — ABNORMAL HIGH (ref 20.0–28.0)
Calcium, Ion: 0.99 mmol/L — ABNORMAL LOW (ref 1.15–1.40)
HCT: 30 % — ABNORMAL LOW (ref 39.0–52.0)
Hemoglobin: 10.2 g/dL — ABNORMAL LOW (ref 13.0–17.0)
O2 Saturation: 85 %
Potassium: 2.8 mmol/L — ABNORMAL LOW (ref 3.5–5.1)
Sodium: 134 mmol/L — ABNORMAL LOW (ref 135–145)
TCO2: 33 mmol/L — ABNORMAL HIGH (ref 22–32)
pCO2, Ven: 47.6 mmHg (ref 44–60)
pH, Ven: 7.429 (ref 7.25–7.43)
pO2, Ven: 49 mmHg — ABNORMAL HIGH (ref 32–45)

## 2023-02-03 LAB — LACTIC ACID, PLASMA
Lactic Acid, Venous: 0.9 mmol/L (ref 0.5–1.9)
Lactic Acid, Venous: 0.9 mmol/L (ref 0.5–1.9)

## 2023-02-03 LAB — TROPONIN I (HIGH SENSITIVITY)
Troponin I (High Sensitivity): 126 ng/L (ref <18)
Troponin I (High Sensitivity): 136 ng/L (ref <18)

## 2023-02-03 LAB — CBG MONITORING, ED
Glucose-Capillary: 68 mg/dL — ABNORMAL LOW (ref 70–99)
Glucose-Capillary: 89 mg/dL (ref 70–99)

## 2023-02-03 LAB — PHOSPHORUS: Phosphorus: 4.2 mg/dL (ref 2.5–4.6)

## 2023-02-03 LAB — BASIC METABOLIC PANEL
Anion gap: 11 (ref 5–15)
BUN: 21 mg/dL — ABNORMAL HIGH (ref 6–20)
CO2: 27 mmol/L (ref 22–32)
Calcium: 7.6 mg/dL — ABNORMAL LOW (ref 8.9–10.3)
Chloride: 91 mmol/L — ABNORMAL LOW (ref 98–111)
Creatinine, Ser: 6.59 mg/dL — ABNORMAL HIGH (ref 0.61–1.24)
GFR, Estimated: 9 mL/min — ABNORMAL LOW (ref >60)
Glucose, Bld: 65 mg/dL — ABNORMAL LOW (ref 70–99)
Potassium: 3.2 mmol/L — ABNORMAL LOW (ref 3.5–5.1)
Sodium: 129 mmol/L — ABNORMAL LOW (ref 135–145)

## 2023-02-03 LAB — MAGNESIUM: Magnesium: 1.7 mg/dL (ref 1.7–2.4)

## 2023-02-03 LAB — ECHOCARDIOGRAM LIMITED
Est EF: <20
S' Lateral: 4.7 cm

## 2023-02-03 LAB — BRAIN NATRIURETIC PEPTIDE: B Natriuretic Peptide: 1200.6 pg/mL — ABNORMAL HIGH (ref 0.0–100.0)

## 2023-02-03 LAB — LACTATE DEHYDROGENASE: LDH: 165 U/L (ref 98–192)

## 2023-02-03 LAB — AMMONIA: Ammonia: 46 umol/L — ABNORMAL HIGH (ref 9–35)

## 2023-02-03 MED ORDER — LEVOCETIRIZINE DIHYDROCHLORIDE 5 MG PO TABS: 5.0000 mg | ORAL_TABLET | Freq: Every evening | ORAL | Status: DC

## 2023-02-03 MED ORDER — CHLORHEXIDINE GLUCONATE CLOTH 2 % EX PADS
6.0000 | MEDICATED_PAD | Freq: Every day | CUTANEOUS | Status: DC
  Administered 2023-02-03 – 2023-02-05 (×3): 6 via TOPICAL

## 2023-02-03 MED ORDER — ONDANSETRON HCL 4 MG PO TABS: 4.0000 mg | ORAL_TABLET | Freq: Two times a day (BID) | ORAL | Status: DC

## 2023-02-03 MED ORDER — SEVELAMER CARBONATE 0.8 G PO PACK
0.8000 g | PACK | Freq: Three times a day (TID) | ORAL | Status: DC
  Administered 2023-02-03 – 2023-02-04 (×4): 0.8 g via ORAL
  Filled 2023-02-03 (×31): qty 1

## 2023-02-03 MED ORDER — AMIODARONE HCL 200 MG PO TABS
400.0000 mg | ORAL_TABLET | Freq: Two times a day (BID) | ORAL | Status: DC
  Administered 2023-02-03: 400 mg via ORAL
  Filled 2023-02-03: qty 2

## 2023-02-03 MED ORDER — LORATADINE 10 MG PO TABS
10.0000 mg | ORAL_TABLET | Freq: Every day | ORAL | Status: DC
  Administered 2023-02-04 – 2023-02-13 (×10): 10 mg via ORAL
  Filled 2023-02-03 (×10): qty 1

## 2023-02-03 MED ORDER — MIDODRINE HCL 5 MG PO TABS
10.0000 mg | ORAL_TABLET | ORAL | Status: DC
  Administered 2023-02-06 – 2023-02-13 (×4): 10 mg via ORAL
  Filled 2023-02-03 (×5): qty 2

## 2023-02-03 MED ORDER — POTASSIUM CHLORIDE 20 MEQ PO PACK
40.0000 meq | PACK | Freq: Once | ORAL | Status: AC
  Administered 2023-02-03: 40 meq via ORAL
  Filled 2023-02-03: qty 2

## 2023-02-03 MED ORDER — QUINIDINE SULFATE 200 MG PO TABS
200.0000 mg | ORAL_TABLET | Freq: Two times a day (BID) | ORAL | Status: DC
  Administered 2023-02-03 – 2023-02-07 (×9): 200 mg via ORAL
  Filled 2023-02-03 (×9): qty 1

## 2023-02-03 MED ORDER — ASPIRIN 81 MG PO TBEC
81.0000 mg | DELAYED_RELEASE_TABLET | Freq: Every day | ORAL | Status: DC
  Administered 2023-02-03 – 2023-02-13 (×11): 81 mg via ORAL
  Filled 2023-02-03 (×11): qty 1

## 2023-02-03 MED ORDER — LACTATED RINGERS IV BOLUS
250.0000 mL | Freq: Once | INTRAVENOUS | Status: AC
  Administered 2023-02-03: 250 mL via INTRAVENOUS

## 2023-02-03 MED ORDER — ACETAMINOPHEN 325 MG PO TABS: 650.0000 mg | ORAL_TABLET | ORAL | Status: DC | PRN

## 2023-02-03 MED ORDER — ONDANSETRON HCL 4 MG PO TABS: 4.0000 mg | ORAL_TABLET | Freq: Two times a day (BID) | ORAL | Status: DC | PRN

## 2023-02-03 MED ORDER — SODIUM CHLORIDE 0.9 % IV SOLN: 2.0000 g | Freq: Once | INTRAVENOUS | Status: DC

## 2023-02-03 MED ORDER — DOXERCALCIFEROL 4 MCG/2ML IV SOLN
5.0000 ug | INTRAVENOUS | Status: DC
  Administered 2023-02-06 – 2023-02-10 (×3): 5 ug via INTRAVENOUS
  Filled 2023-02-03 (×7): qty 4

## 2023-02-03 MED ORDER — AMIODARONE HCL 200 MG PO TABS
400.0000 mg | ORAL_TABLET | Freq: Every day | ORAL | Status: DC
  Administered 2023-02-04 – 2023-02-07 (×4): 400 mg via ORAL
  Filled 2023-02-03 (×4): qty 2

## 2023-02-03 MED ORDER — PANTOPRAZOLE SODIUM 40 MG PO TBEC
40.0000 mg | DELAYED_RELEASE_TABLET | Freq: Two times a day (BID) | ORAL | Status: DC
  Administered 2023-02-03 – 2023-02-13 (×19): 40 mg via ORAL
  Filled 2023-02-03 (×19): qty 1

## 2023-02-03 MED ORDER — ATORVASTATIN CALCIUM 40 MG PO TABS
40.0000 mg | ORAL_TABLET | Freq: Every day | ORAL | Status: DC
  Administered 2023-02-03 – 2023-02-12 (×10): 40 mg via ORAL
  Filled 2023-02-03 (×10): qty 1

## 2023-02-03 MED ORDER — CINACALCET HCL 30 MG PO TABS
180.0000 mg | ORAL_TABLET | ORAL | Status: DC
  Filled 2023-02-03 (×5): qty 6

## 2023-02-03 MED ORDER — SODIUM CHLORIDE 0.9 % IV BOLUS
500.0000 mL | Freq: Once | INTRAVENOUS | Status: AC
  Administered 2023-02-03: 500 mL via INTRAVENOUS

## 2023-02-03 MED ORDER — SODIUM CHLORIDE 0.9 % IV SOLN: 500.0000 mg | Freq: Once | INTRAVENOUS | Status: DC

## 2023-02-03 MED ORDER — CITALOPRAM HYDROBROMIDE 20 MG PO TABS
20.0000 mg | ORAL_TABLET | Freq: Every day | ORAL | Status: DC
  Administered 2023-02-03 – 2023-02-13 (×11): 20 mg via ORAL
  Filled 2023-02-03 (×9): qty 1
  Filled 2023-02-03: qty 2
  Filled 2023-02-03: qty 1

## 2023-02-03 MED ORDER — CEFADROXIL 500 MG PO CAPS
1000.0000 mg | ORAL_CAPSULE | ORAL | Status: DC
  Administered 2023-02-03 – 2023-02-10 (×4): 1000 mg via ORAL
  Filled 2023-02-03 (×6): qty 2

## 2023-02-03 MED ORDER — DEXTROSE 50 % IV SOLN
25.0000 mL | Freq: Once | INTRAVENOUS | Status: AC
  Administered 2023-02-03: 25 mL via INTRAVENOUS
  Filled 2023-02-03: qty 50

## 2023-02-03 MED ORDER — ONDANSETRON HCL 4 MG/2ML IJ SOLN
4.0000 mg | Freq: Two times a day (BID) | INTRAMUSCULAR | Status: DC | PRN
  Administered 2023-02-03 – 2023-02-04 (×3): 4 mg via INTRAVENOUS
  Filled 2023-02-03 (×3): qty 2

## 2023-02-03 MED ORDER — MAGNESIUM SULFATE IN D5W 1-5 GM/100ML-% IV SOLN
1.0000 g | Freq: Once | INTRAVENOUS | Status: AC
  Administered 2023-02-03: 1 g via INTRAVENOUS
  Filled 2023-02-03: qty 100

## 2023-02-03 NOTE — H&P (Signed)
Advanced Heart Failure VAD History and Physical Note   PCP-Cardiologist: Dr. Shirlee Latch    Reason for Admission: Lethargy/AMS and Low Flow Alarm   HPI:    57 y.o. with history of CAD s/p anterior MI in 2010 and NSTEMI in 3/18 with DES to pRCA and mLAD and ischemic cardiomyopathy s/p Heartmate 3 LVAD in 2018 and ESRD on HD. He also had hx of ischemic lower extremities s/p bilateral AKAs.    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 Repeated trauma, refuses to wear anchor.   VT- 10/2021., 10/2022, 11/2022, 01/2023 VF 06/2022    On 01/21/23, patient had 12 episodes of VT, multiple ATP and 1 shock.  Saw Dr Shirlee Latch 01/24/23 and he reported he had only been taking mexiletine 2-3 times a week. Reported intolerance to mexiletine due to nausea. Dr. Shirlee Latch instructed to take mexiletine 150 mg twice a day.    On 05/24, he was in HD and received unsuccessful ICD shock for VT after he exhausted ATP therapies. Unable to pull volume at HD. Had 3 low flows. Sent to the ED. EP consulted and was able to ATP him out of VT with his device via more aggressive tachy scheme. He was admitted for further management. Placed on quinidine 200 mg twice a day and amio 200 mg twice a day and had no further VT. Was discharged on 5/27.  Pt now presents back to the ED via EMS from HD center given increase lethargy, hypotension and a low flow VAD alarm.  SBP dropped in the 80s. HD session terminated. Only able to complete ~20 min of treatment w/ less than 200 ml of volume removal. Given 400 cc bolus of IVF then transported to ED.   He remains lethargic but will awake and respond to questions. Altered, not oriented to place or time. ED BP post IVF bolus 97/84. Dopper 98. Tele shows NSR. ICD interrogation pending. Labs pending. Head CT and chest CT ordered. On 3L [place], O2 sats 99%. Daughter present at beside and reports that he has been lethargic since returning home. Has had nausea and vomiting and was taking  phenergan for nausea. 12 lead EKG pending.   He denies CP. Has not felt any ICD shocks.    LVAD INTERROGATION:  HeartMate III LVAD:  Flow 3.1 liters/min, speed 5350, power 4.1, PI 3.4.  1 low flow alarm    Review of Systems: [y] = yes, [ ]  = no   General: Weight gain [ ] ; Weight loss [ ] ; Anorexia [ ] ; Fatigue [ Y]; Fever [ ] ; Chills [ ] ; Weakness [Y ]  Cardiac: Chest pain/pressure [ ] ; Resting SOB [ ] ; Exertional SOB [ ] ; Orthopnea [ ] ; Pedal Edema [ ] ; Palpitations [ ] ; Syncope [ ] ; Presyncope [ ] ; Paroxysmal nocturnal dyspnea[ ]   Pulmonary: Cough [ ] ; Wheezing[ ] ; Hemoptysis[ ] ; Sputum [ ] ; Snoring [ ]   GI: Vomiting[ ] ; Dysphagia[ ] ; Melena[ ] ; Hematochezia [ ] ; Heartburn[ ] ; Abdominal pain [ ] ; Constipation [ ] ; Diarrhea [ ] ; BRBPR [ ]   GU: Hematuria[ ] ; Dysuria [ ] ; Nocturia[ ]   Vascular: Pain in legs with walking [ ] ; Pain in feet with lying flat [ ] ; Non-healing sores [ ] ; Stroke [ ] ; TIA [ ] ; Slurred speech [ ] ;  Neuro: Headaches[ ] ; Vertigo[ ] ; Seizures[ ] ; Paresthesias[ ] ;Blurred vision [ ] ; Diplopia [ ] ; Vision changes [ ]   Ortho/Skin: Arthritis [ ] ; Joint pain [ ] ; Muscle pain [ ] ; Joint swelling [ ] ;  Back Pain [ ] ; Rash [ ]   Psych: Depression[ ] ; Anxiety[ ]   Heme: Bleeding problems [ ] ; Clotting disorders [ ] ; Anemia [ ]   Endocrine: Diabetes [ ] ; Thyroid dysfunction[ ]     Home Medications Prior to Admission medications   Medication Sig Start Date End Date Taking? Authorizing Provider  amiodarone (PACERONE) 400 MG tablet Take 1 tablet (400 mg total) by mouth 2 (two) times daily. Take 400 mg (1 tablet) twice a day x 1 week starting 01/27/23. Then decrease to 400 mg (1 tablet) daily. 01/30/23   Clegg, Amy D, NP  aspirin EC 81 MG tablet Take 1 tablet (81 mg total) by mouth daily. 08/20/18   Janetta Hora, PA-C  atorvastatin (LIPITOR) 40 MG tablet Take 1 tablet (40 mg total) by mouth daily at 6 PM. 11/20/20   Leeroy Bock, MD  calcitRIOL (ROCALTROL) 0.5 MCG capsule  Take 1 capsule (0.5 mcg total) by mouth every Monday, Wednesday, and Friday. 06/06/18   Alford Highland, NP  cefadroxil (DURICEF) 500 MG capsule Take 2 capsules (1,000 mg total) by mouth 3 (three) times a week. Take AFTER dialysis sessions 06/13/22   Blanchard Kelch, NP  citalopram (CELEXA) 20 MG tablet Take 1 tablet (20 mg total) by mouth daily. 03/19/18   Alford Highland, NP  COLACE 100 MG capsule Take 100 mg by mouth daily as needed for moderate constipation. 07/30/19   [provider]  fluticasone (FLONASE) 50 MCG/ACT nasal spray Place 1 spray into both nostrils daily. 11/23/22   Omar Person, MD  hydrALAZINE (APRESOLINE) 25 MG tablet Take 1 tablet three times a day on NON-dialysis days only 04/26/22   Laurey Morale, MD  insulin glargine (LANTUS) 100 UNIT/ML injection Inject 0.35 mLs (35 Units total) into the skin every morning. 08/25/22   Bess Kinds, MD  levocetirizine (XYZAL) 5 MG tablet Take 1 tablet (5 mg total) by mouth every evening. 11/23/22   Omar Person, MD  LORazepam (ATIVAN) 0.5 MG tablet Take 1 tablet (0.5 mg total) by mouth every 12 (twelve) hours as needed (nausea). 02/26/20   [name] M, PA-C  metoCLOPramide (REGLAN) 10 MG tablet Take 1 tablet (10 mg total) by mouth 3 (three) times daily. Patient taking differently: Take 10 mg by mouth 3 (three) times daily before meals. 01/22/18   Alford Highland, NP  midodrine (PROAMATINE) 5 MG tablet Take 2 tablets (10 mg total) by mouth every Monday, Wednesday, and Friday. Take 1 tablet before dialysis treatment 09/07/22   Laurey Morale, MD  multivitamin (RENA-VIT) TABS tablet Take 1 tablet by mouth daily.    [provider]  ondansetron (ZOFRAN-ODT) 4 MG disintegrating tablet Take by mouth as needed. 03/19/18   [provider]  oxyCODONE-acetaminophen (PERCOCET) 7.5-325 MG tablet Take 1 tablet by mouth 3 (three) times daily as needed for pain. 06/28/21   [provider]  pantoprazole  (PROTONIX) 40 MG tablet Take 1 tablet (40 mg total) by mouth 2 (two) times daily before lunch and supper. 05/07/19   Barrett, Joline Salt, PA-C  promethazine (PHENERGAN) 12.5 MG tablet TAKE 1 TABLET (12.5 MG TOTAL) BY MOUTH EVERY 6 (SIX) HOURS AS NEEDED FOR NAUSEA OR VOMITING. 07/20/20   Laurey Morale, MD  quiNIDine sulfate 200 MG tablet Take 1 tablet (200 mg total) by mouth 2 (two) times daily. 01/27/23   Laurey Morale, MD  quiNIDine sulfate 200 MG tablet Take 1 tablet (200 mg total) by mouth in  the morning and at bedtime. 01/27/23   Bensimhon, Bevelyn Buckles, MD  sevelamer carbonate (RENVELA) 0.8 g PACK packet Take 0.8 g by mouth 3 (three) times daily with meals. 06/16/19   Bhagat, Sharrell Ku, PA  triamcinolone ointment (KENALOG) 0.1 % Apply 1 Application topically 2 (two) times daily. 08/25/22   Bess Kinds, MD  warfarin (COUMADIN) 5 MG tablet Take 1 tablet (5 mg total) by mouth daily. 01/31/23   Tonye Becket D, NP    Past Medical History: Past Medical History:  Diagnosis Date   Angina    ASCVD (arteriosclerotic cardiovascular disease)    , Anterior infarction 2005, LAD diagonal bifurcation intervention 03/2004   Automatic implantable cardiac defibrillator -St. Jude's        Benign neoplasm of colon    CHF (congestive heart failure) (HCC)    Chronic systolic heart failure (HCC)    Coronary artery disease     Widely patent previously placed stents in the left anterior    Crohn's disease (HCC)    Deep venous thrombosis (HCC)    Recurrent-on Coumadin   Dialysis patient (HCC)    Dyspnea    Gastroparesis    GERD (gastroesophageal reflux disease)    High cholesterol    Hyperlipidemia    Hypersomnolent    Previous diagnosis of narcolepsy   Hypertension, essential    Ischemic cardiomyopathy    Ejection fraction 15-20% catheterization 2010   Type II or unspecified type diabetes mellitus without mention of complication, not stated as uncontrolled    Unspecified gastritis and gastroduodenitis  without mention of hemorrhage     Past Surgical History: Past Surgical History:  Procedure Laterality Date   A/V SHUNTOGRAM N/A 05/24/2018   Procedure: A/V SHUNTOGRAM;  Surgeon: Cephus Shelling, MD;  Location: [facility] INVASIVE CV LAB;  Service: Cardiovascular;  Laterality: N/A;   ABDOMINAL AORTOGRAM W/LOWER EXTREMITY N/A 03/18/2019   Procedure: ABDOMINAL AORTOGRAM W/LOWER EXTREMITY;  Surgeon: Maeola Harman, MD;  Location: [facility] INVASIVE CV LAB;  Service: Cardiovascular;  Laterality: N/A;   AMPUTATION Right 03/21/2019   Procedure: AMPUTATION DIGIT RIGHT FOURTH TOE;  Surgeon: Maeola Harman, MD;  Location: [facility] OR;  Service: Vascular;  Laterality: Right;   AMPUTATION Left 05/29/2019   Procedure: AMPUTATION LEFT GREAT TOE;  Surgeon: Nada Libman, MD;  Location: [facility] OR;  Service: Vascular;  Laterality: Left;   AMPUTATION Right 06/02/2019   Procedure: ABOVE KNEE AMPUTATION;  Surgeon: Larina Earthly, MD;  Location: [facility] OR;  Service: Vascular;  Laterality: Right;   AMPUTATION Left 07/23/2019   Procedure: AMPUTATION ABOVE KNEE LEFT;  Surgeon: Sherren Kerns, MD;  Location: [facility] OR;  Service: Vascular;  Laterality: Left;   APPENDECTOMY     AV FISTULA PLACEMENT Right 06/26/2017   Procedure: ARTERIOVENOUS (AV) FISTULA CREATION VERSUS GRAFT RIGHT  ARM;  Surgeon: Fransisco Hertz, MD;  Location: [facility] OR;  Service: Vascular;  Laterality: Right;   AV FISTULA PLACEMENT Right 10/31/2017   Procedure: INSERTION OF ARTERIOVENOUS (AV) ARTEGRAFT,  RIGHT UPPER ARM;  Surgeon: Fransisco Hertz, MD;  Location: [facility] OR;  Service: Vascular;  Laterality: Right;   AV FISTULA PLACEMENT Left 05/29/2018   Procedure: ARTERIOVENOUS (AV) FISTULA CREATION  LEFT UPPER EXTREMITY;  Surgeon: Maeola Harman, MD;  Location: [facility] OR;  Service: Vascular;  Laterality: Left;   AV FISTULA PLACEMENT Left 08/09/2018   Procedure: INSERTION OF ARTERIOVENOUS (AV) GORE-TEX GRAFT LEFT UPPER ARM;  Surgeon: Maeola Harman,  MD;  Location: [facility] OR;  Service:  Vascular;  Laterality: Left;   CARDIAC DEFIBRILLATOR PLACEMENT  2010   St. Jude ICD   CENTRAL LINE INSERTION Right 05/24/2018   Procedure: CENTRAL LINE INSERTION;  Surgeon: Cephus Shelling, MD;  Location: [facility] INVASIVE CV LAB;  Service: Cardiovascular;  Laterality: Right;   COLECTOMY  ~ 2003   "for Crohn's" ascending colon.     CORONARY STENT INTERVENTION N/A 11/25/2016   Procedure: Coronary Stent Intervention;  Surgeon: Marykay Lex, MD;  Location: [facility] INVASIVE CV LAB;  Service: Cardiovascular;  Laterality: N/A;   EP IMPLANTABLE DEVICE N/A 09/14/2016   Procedure: ICD Generator Changeout;  Surgeon: Duke Salvia, MD;  Location: [facility] INVASIVE CV LAB;  Service: Cardiovascular;  Laterality: N/A;   ESOPHAGOGASTRODUODENOSCOPY N/A 07/12/2017   Procedure: ESOPHAGOGASTRODUODENOSCOPY (EGD);  Surgeon: Beverley Fiedler, MD;  Location: [facility] ENDOSCOPY;  Service: Gastroenterology;  Laterality: N/A;  VAD pt so VAD team will need to accompany pt.    FETAL SURGERY FOR CONGENITAL HERNIA     ????? pt has no knowledge of this.[name]     FISTULOGRAM Left 08/09/2018   Procedure: FISTULOGRAM LEFT ARM;  Surgeon: Maeola Harman, MD;  Location: [facility] OR;  Service: Vascular;  Laterality: Left;   INGUINAL HERNIA REPAIR     INSERTION OF DIALYSIS CATHETER Right 06/26/2017   Procedure: INSERTION OF TUNNELED  DIALYSIS CATHETER RIGHT INTERNAL JUGULAR;  Surgeon: Fransisco Hertz, MD;  Location: [facility] OR;  Service: Vascular;  Laterality: Right;   INSERTION OF IMPLANTABLE LEFT VENTRICULAR ASSIST DEVICE N/A 06/12/2017   Procedure: INSERTION OF IMPLANTABLE LEFT VENTRICULAR ASSIST DEVICE-HEARTMATE 3;  Surgeon: Kerin Perna, MD;  Location: [facility] OR;  Service: Open Heart Surgery;  Laterality: N/A;  HM3 LVAD  CIRC ARREST  NITRIC OXIDE   INSERTION OF IMPLANTABLE LEFT VENTRICULAR ASSIST DEVICE N/A 02/28/2018   Procedure: REDO INSERTION OF IMPLANTABLE LEFT VENTRICULAR ASSIST DEVICE - HEARTMATE 3;  Surgeon: Kerin Perna, MD;  Location: [facility] OR;  Service: Open Heart Surgery;  Laterality: N/A;   IR FLUORO GUIDE CV LINE RIGHT  07/22/2019   IR REMOVAL TUN CV CATH W/O FL  02/26/2018   IR THORACENTESIS ASP PLEURAL SPACE W/IMG GUIDE  06/28/2017   IR US GUIDE VASC ACCESS RIGHT  07/22/2019   LIGATION OF ARTERIOVENOUS  FISTULA Right 10/31/2017   Procedure: LIGATION OF BRACHIO-BASILIC VEIN TRANSPOSITION RIGHT ARM;  Surgeon: Fransisco Hertz, MD;  Location: [facility] OR;  Service: Vascular;  Laterality: Right;   LOWER EXTREMITY ANGIOGRAPHY  05/02/2019   Procedure: Lower Extremity Angiography;  Surgeon: Cephus Shelling, MD;  Location: [facility] INVASIVE CV LAB;  Service: Cardiovascular;;   MULTIPLE EXTRACTIONS WITH ALVEOLOPLASTY N/A 06/08/2017   Procedure: MULTIPLE EXTRACTION WITH ALVEOLOPLASTY AND PRE PROSTHETIC SURGERY AS NEEDED;  Surgeon: Charlynne Pander, DDS;  Location: [facility] OR;  Service: Oral Surgery;  Laterality: N/A;   PERIPHERAL VASCULAR BALLOON ANGIOPLASTY  03/18/2019   Procedure: PERIPHERAL VASCULAR BALLOON ANGIOPLASTY;  Surgeon: Maeola Harman, MD;  Location: [facility] INVASIVE CV LAB;  Service: Cardiovascular;;  RT PT   PERIPHERAL VASCULAR INTERVENTION  03/18/2019   Procedure: PERIPHERAL VASCULAR INTERVENTION;  Surgeon: Maeola Harman, MD;  Location: [facility] INVASIVE CV LAB;  Service: Cardiovascular;;  RT SFA   PERIPHERAL VASCULAR INTERVENTION  05/02/2019   Procedure: PERIPHERAL VASCULAR INTERVENTION;  Surgeon: Cephus Shelling, MD;  Location: [facility] INVASIVE CV LAB;  Service: Cardiovascular;;  Lt. SFA   RIGHT HEART CATH N/A 06/07/2017   Procedure: RIGHT HEART CATH;  Surgeon: Laurey Morale,  MD;  Location: [facility] INVASIVE CV LAB;  Service: Cardiovascular;  Laterality: N/A;   RIGHT HEART CATH N/A 02/08/2018   Procedure: RIGHT HEART CATH;  Surgeon: Laurey Morale, MD;  Location: [facility] INVASIVE CV LAB;  Service: Cardiovascular;  Laterality: N/A;   RIGHT HEART CATH N/A 08/09/2018   Procedure: RIGHT HEART CATH;   Surgeon: Laurey Morale, MD;  Location: [facility] INVASIVE CV LAB;  Service: Cardiovascular;  Laterality: N/A;   RIGHT/LEFT HEART CATH AND CORONARY ANGIOGRAPHY N/A 11/23/2016   Procedure: Right/Left Heart Cath and Coronary Angiography;  Surgeon: Laurey Morale, MD;  Location: [facility] INVASIVE CV LAB;  Service: Cardiovascular;  Laterality: N/A;   TEE WITHOUT CARDIOVERSION N/A 06/12/2017   Procedure: TRANSESOPHAGEAL ECHOCARDIOGRAM (TEE);  Surgeon: Donata Clay, Theron Arista, MD;  Location: [facility] OR;  Service: Open Heart Surgery;  Laterality: N/A;   TEE WITHOUT CARDIOVERSION N/A 02/28/2018   Procedure: TRANSESOPHAGEAL ECHOCARDIOGRAM (TEE);  Surgeon: Donata Clay, Theron Arista, MD;  Location: [facility] OR;  Service: Open Heart Surgery;  Laterality: N/A;   TEMPORARY DIALYSIS CATHETER Left 05/24/2018   Procedure: TEMPORARY DIALYSIS CATHETER;  Surgeon: Cephus Shelling, MD;  Location: [facility] INVASIVE CV LAB;  Service: Cardiovascular;  Laterality: Left;   THROMBECTOMY AND REVISION OF ARTERIOVENTOUS (AV) GORETEX  GRAFT Right 12/12/2017   Procedure: THROMBECTOMY ARTERIOVENTOUS (AV) GORETEX  GRAFT RIGHT UPPER ARM;  Surgeon: Fransisco Hertz, MD;  Location: [facility] OR;  Service: Vascular;  Laterality: Right;   TRANSMETATARSAL AMPUTATION Right 04/30/2019   Procedure: RIGHT TRANSMETATARSAL AMPUTATION;  Surgeon: Maeola Harman, MD;  Location: [facility] OR;  Service: Vascular;  Laterality: Right;    Family History: Family History  Problem Relation Age of Onset   Colon polyps Mother    Diabetes Mother    Heart attack Father    Diabetes Father    Stroke Paternal Grandfather    Colitis Sister    Heart disease Brother    Colitis Sister    Colon cancer Neg Hx     Social History: Social History   Socioeconomic History   Marital status: Married    Spouse name: Not on file   Number of children: Not on file   Years of education: Not on file   Highest education level: Not on file  Occupational History   Not on file  Tobacco Use   Smoking status:  Never    Passive exposure: Past   Smokeless tobacco: Never  Vaping Use   Vaping Use: Never used  Substance and Sexual Activity   Alcohol use: No   Drug use: No   Sexual activity: Never  Other Topics Concern   Not on file  Social History Narrative   Not on file   Social Determinants of Health   Financial Resource Strain: Low Risk  (12/06/2022)   Overall Financial Resource Strain (CARDIA)    Difficulty of Paying Living Expenses: Not hard at all  Food Insecurity: No Food Insecurity (12/06/2022)   Hunger Vital Sign    Worried About Running Out of Food in the Last Year: Never true    Ran Out of Food in the Last Year: Never true  Transportation Needs: No Transportation Needs (11/19/2019)   PRAPARE - Administrator, Civil Service (Medical): No    Lack of Transportation (Non-Medical): No  Physical Activity: Inactive (12/06/2022)   Exercise Vital Sign    Days of Exercise per Week: 0 days    Minutes of Exercise per Session: 0 min  Stress: No Stress Concern Present (  12/06/2022)   [employer] [employer] - Occupational Stress Questionnaire    Feeling of Stress : Not at all  Social Connections: Socially Integrated (12/06/2022)   Social Connection and Isolation Panel [NHANES]    Frequency of Communication with Friends and Family: More than three times a week    Frequency of Social Gatherings with Friends and Family: More than three times a week    Attends Religious Services: More than 4 times per year    Active Member of [employer] or Organizations: Yes    Attends Engineer, structural: More than 4 times per year    Marital Status: Married    Allergies:  Allergies  Allergen Reactions   Metformin And Related Diarrhea    Objective:    Vital Signs:   Temp:  [98.1 F (36.7 C)] 98.1 F (36.7 C) (05/31 0811) Pulse Rate:  [67] 67 (05/31 0758) Resp:  [12] 12 (05/31 0758) SpO2:  [100 %] 100 % (05/31 0758)   There were no vitals filed for this visit.  Mean  arterial Pressure 98  Physical Exam    General: chronically ill appearing, lethargic but easy to arouse and will respond to questions  HEENT: Normal Neck: supple. JVP . Carotids 2+ bilat; no bruits. No lymphadenopathy or thyromegaly appreciated. Cor: Mechanical heart sounds with LVAD hum present. Lungs: Clear Abdomen: soft, nontender, nondistended. No hepatosplenomegaly. No bruits or masses. Good bowel sounds. Driveline: C/D/I; securement device intact and driveline incorporated Extremities: no cyanosis, clubbing, rash, edema + b/l AKA  Neuro: lethargic but easy to arouse, altered. Oriented to person, not place or time. Affect flat   Telemetry   NSR   EKG   12 lead pending   Labs    Basic Metabolic Panel: Recent Labs  Lab 01/27/23 1124 01/27/23 1354 01/28/23 0408 01/28/23 0409 01/29/23 0110 01/30/23 0106  NA 135  --   --  135 132* 133*  K 3.3*  --   --  4.1 4.4 4.6  CL 95*  --   --  96* 94* 95*  CO2 29  --   --  26 27 24   GLUCOSE 54*  --   --  122* 155* 110*  BUN 11  --   --  19 35* 52*  CREATININE 3.80*  --   --  5.64* 7.39* 9.23*  CALCIUM 8.3*  --   --  9.2 9.3 9.0  MG  --  2.0  --  2.0 2.2 2.1  PHOS  --   --  4.8*  --   --   --     Liver Function Tests: Recent Labs  Lab 01/27/23 1124  AST 51*  ALT 32  ALKPHOS 92  BILITOT 0.8  PROT 7.4  ALBUMIN 3.2*   No results for input(s): "LIPASE", "AMYLASE" in the last 168 hours. No results for input(s): "AMMONIA" in the last 168 hours.  CBC: Recent Labs  Lab 01/27/23 1124 01/28/23 0409 01/29/23 0110 01/30/23 0106  WBC 5.5 4.6 5.2 6.0  HGB 10.0* 9.5* 9.4* 9.3*  HCT 31.3* 29.7* 29.3* 29.7*  MCV 87.2 87.6 86.7 87.9  PLT 127* 125* 139* 137*    Cardiac Enzymes: No results for input(s): "CKTOTAL", "CKMB", "CKMBINDEX", "TROPONINI" in the last 168 hours.  BNP: BNP (last 3 results) No results for input(s): "BNP" in the last 8760 hours.  ProBNP (last 3 results) No results for input(s): "PROBNP" in the  last 8760 hours.   CBG: Recent Labs  Lab 01/29/23  9147 01/29/23 1605 01/29/23 2147 01/30/23 0638 01/30/23 1218  GLUCAP 116* 119* 106* 107* 81    Coagulation Studies: No results for input(s): "LABPROT", "INR" in the last 72 hours.  Other results: EKG: 12 lead EKG ordered/ pending   Imaging    No results found.    Patient Profile:   57 y.o. with history of CAD s/p anterior MI in 2010 and NSTEMI in 3/18 with DES to pRCA and mLAD and ischemic cardiomyopathy s/p Heartmate 3 LVAD in 2018 and ESRD on HD. He also had hx of ischemic lower extremities s/p bilateral AKAs. H/o repeated DL infections now on chronic cefadroxil for chronic suppression and recent hospitalization for refractory VT, brought into ED by EMS from HD for lethargy, hypotension, AMS and low flow alarm on VAD.   Assessment/Plan:    1. Lethargy/AMS  - Hypotensive at HD, SBPs 80s. HD terminated. Bolused w/ 400 cc IVFs - BP improved, 97/84. Dopper MAP 98. Tele shows NSR. Had 1 low flow alarm this morning, but VAD parameters currently stable  - interrogate ICD to check for recurrent VT - obtain head CT and chest CT - check BCx, CBC, CMP and Mg  - he dose have a h/o taking pain meds. Limit use for now. If worsens, trial Narcan   2. Recent VT - recent admit for refractory VT, failed multiple ATPs. EP consulted. Device adjusted>>ATP in ED with conversion. Placed on oral amiodarone + quinidine w/ suppression of VT - back w/ above symptoms + low flow VAD alarm. Tele shows NSR - interrogate device - given recent n/v and frequent phenergan use, will obtain 12 lead EKG to check QTc and check BMP and Mg level  - continue PO amio and quinidine - if recurrent VT on interrogation, will re consult EP  - continue on tele   3. HFrEF, s/p HMIII, ICM  St Jude ICD.  NSTEMI in March 2018 with DES to LAD and RCA, complicated by cardiogenic shock, low output requiring milrinone.  Echo 8/18 with EF 20-25%, moderate MR. CPX (8/18)  with severe functional impairment due to HF.  He is s/p HM3 LVAD placement. He had pump thrombosis 6/19 due to [facility] outflow graft kinking.  He had pump exchange for another HM3.  LVAD parameters stable but he does have PI events and low flow events at times with HD. He takes midodrine pre-HD, but needs hydralazine on non-HD days.  - Had Ramp Echo 5/21. Speed decreased to 5300 - Volume managed per HD. Holding HD today given suspected low volume status  - Continue midodrine 10 mg tid.  - On warfarin. Check INR and LDH and CBC   4. Driveline infection, chronic  - Continue cefadroxil long term   - Check Bcx and CBC    5. ESRD  - iHD M-W-F  - session terminated early today given hypotension and low flow alarms - if readmitted, will consult nephrology to manage HD     6. PAD - S/P bilateral AKA     I reviewed the LVAD parameters from today, and compared the results to the patient's prior recorded data.  No programming changes were made.  The LVAD is functioning within specified parameters.  The patient performs LVAD self-test daily.  LVAD interrogation was negative for any significant power changes, alarms or PI events/speed drops.  LVAD equipment check completed and is in good working order.  Back-up equipment present.   LVAD education done on emergency procedures and precautions and reviewed exit site  care.  Length of Stay: 0  [name], PA-C 02/03/2023, 8:11 AM  VAD Team Pager [phone] (7am - 7am) +++VAD ISSUES ONLY+++   Advanced Heart Failure Team Pager [phone] (M-F; 7a - 5p)  Please contact [facility] for night-coverage after hours (5p -7a ) and weekends on [email or web address] for all non- LVAD Issues

## 2023-02-03 NOTE — ED Notes (Signed)
Dr. Suezanne Jacquet notified of elevated trop and hypokalemia

## 2023-02-03 NOTE — ED Notes (Signed)
Cardiology at bedisde

## 2023-02-03 NOTE — ED Notes (Signed)
Lunch tray at bedside. ?

## 2023-02-03 NOTE — ED Notes (Signed)
Echo at bedside

## 2023-02-03 NOTE — Progress Notes (Signed)
Echocardiogram 2D Echocardiogram has been performed.  Warren Lacy Daimian Sudberry RDCS 02/03/2023, 10:35 AM

## 2023-02-03 NOTE — ED Provider Notes (Signed)
[place] EMERGENCY DEPARTMENT AT [facility] Provider Note  CSN: [id number] Arrival date & time: 02/03/23 7829  Chief Complaint(s) No chief complaint on file.  HPI [name] Bennion is a 57 y.o. male with history of CHF with LVAD, coronary artery disease, Crohn's disease, prior DVT, diabetes presenting with altered mental status.  Patient was at dialysis today, LVAD began alarming, they did not know what the alarm was so they called the paramedics.  Dialysis also reported the patient is normally more awake and alert and able to carry on a conversation.  Patient somnolent but denies any chest or abdominal pain, headaches, fevers, belly pain, rashes, nausea or vomiting, or any other new symptoms currently.  Reports medication compliance.   Past Medical History Past Medical History:  Diagnosis Date   Angina    ASCVD (arteriosclerotic cardiovascular disease)    , Anterior infarction 2005, LAD diagonal bifurcation intervention 03/2004   Automatic implantable cardiac defibrillator -[employer] Jude's        Benign neoplasm of colon    CHF (congestive heart failure) (HCC)    Chronic systolic heart failure (HCC)    Coronary artery disease     Widely patent previously placed stents in the left anterior    Crohn's disease (HCC)    Deep venous thrombosis (HCC)    Recurrent-on Coumadin   Dialysis patient (HCC)    Dyspnea    Gastroparesis    GERD (gastroesophageal reflux disease)    High cholesterol    Hyperlipidemia    Hypersomnolent    Previous diagnosis of narcolepsy   Hypertension, essential    Ischemic cardiomyopathy    Ejection fraction 15-20% catheterization 2010   Type II or unspecified type diabetes mellitus without mention of complication, not stated as uncontrolled    Unspecified gastritis and gastroduodenitis without mention of hemorrhage    Patient Active Problem List   Diagnosis Date Noted   AMS (altered mental status) 02/03/2023   VT (ventricular tachycardia) (HCC)  01/27/2023   Unspecified atrial flutter (HCC) 12/01/2022   Skin lesion of cheek 08/18/2022   Wheelchair dependence 08/06/2022   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HCC) 12/29/2021   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/24/2020   Penile ulcer 09/19/2019   PAD (peripheral artery disease) (HCC) 07/23/2019   Warfarin anticoagulation 04/15/2019   Heart failure (HCC) 05/23/2018   Benign essential HTN    Crohn's disease with complication (HCC)    Diabetes mellitus type 2 in nonobese (HCC)    Anemia of chronic disease    Sinus tachycardia    Factor V Leiden carrier (HCC) 01/10/2018   History of creation of ostomy (HCC) 01/10/2018   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) 07/10/2017   Chronic systolic CHF (congestive heart failure) (HCC) 06/07/2017   DNR (do not resuscitate) discussion    CHF exacerbation (HCC) 04/05/2017   Diabetic keto-acidosis (HCC) 04/05/2017   ESRD on dialysis (HCC) 01/17/2017   Non-ST elevation (NSTEMI) myocardial infarction [facility])    CHF (congestive heart failure) (HCC) 09/02/2016   SVT (supraventricular tachycardia) 04/26/2012   Gastroparesis 11/18/2011   Coronary artery disease    Ischemic cardiomyopathy    Essential hypertension    Automatic implantable cardioverter-defibrillator in situ    POLYP, COLON 09/25/2008   DIVERTICULOSIS, COLON 09/25/2008   History of Crohn's disease 09/05/2006   Home Medication(s) Prior to Admission medications   Medication Sig Start Date End Date Taking? Authorizing Provider  amiodarone (PACERONE)  400 MG tablet Take 1 tablet (400 mg total) by mouth 2 (two) times daily. Take 400 mg (1 tablet) twice a day x 1 week starting 01/27/23. Then decrease to 400 mg (1 tablet) daily. 01/30/23   Clegg, Amy D, NP  aspirin EC 81 MG tablet Take 1 tablet (81 mg total) by mouth daily. 08/20/18   Janetta Hora, PA-C  atorvastatin (LIPITOR) 40 MG tablet Take 1 tablet (40 mg  total) by mouth daily at 6 PM. 11/20/20   Leeroy Bock, MD  calcitRIOL (ROCALTROL) 0.5 MCG capsule Take 1 capsule (0.5 mcg total) by mouth every Monday, Wednesday, and Friday. 06/06/18   Alford Highland, NP  cefadroxil (DURICEF) 500 MG capsule Take 2 capsules (1,000 mg total) by mouth 3 (three) times a week. Take AFTER dialysis sessions 06/13/22   Blanchard Kelch, NP  citalopram (CELEXA) 20 MG tablet Take 1 tablet (20 mg total) by mouth daily. 03/19/18   Alford Highland, NP  COLACE 100 MG capsule Take 100 mg by mouth daily as needed for moderate constipation. 07/30/19   [provider]  fluticasone (FLONASE) 50 MCG/ACT nasal spray Place 1 spray into both nostrils daily. 11/23/22   Omar Person, MD  hydrALAZINE (APRESOLINE) 25 MG tablet Take 1 tablet three times a day on NON-dialysis days only 04/26/22   Laurey Morale, MD  insulin glargine (LANTUS) 100 UNIT/ML injection Inject 0.35 mLs (35 Units total) into the skin every morning. 08/25/22   Bess Kinds, MD  levocetirizine (XYZAL) 5 MG tablet Take 1 tablet (5 mg total) by mouth every evening. 11/23/22   Omar Person, MD  LORazepam (ATIVAN) 0.5 MG tablet Take 1 tablet (0.5 mg total) by mouth every 12 (twelve) hours as needed (nausea). 02/26/20   [name] M, PA-C  metoCLOPramide (REGLAN) 10 MG tablet Take 1 tablet (10 mg total) by mouth 3 (three) times daily. Patient taking differently: Take 10 mg by mouth 3 (three) times daily before meals. 01/22/18   Alford Highland, NP  midodrine (PROAMATINE) 5 MG tablet Take 2 tablets (10 mg total) by mouth every Monday, Wednesday, and Friday. Take 1 tablet before dialysis treatment 09/07/22   Laurey Morale, MD  multivitamin (RENA-VIT) TABS tablet Take 1 tablet by mouth daily.    [provider]  ondansetron (ZOFRAN-ODT) 4 MG disintegrating tablet Take by mouth as needed. 03/19/18   [provider]  oxyCODONE-acetaminophen (PERCOCET) 7.5-325 MG tablet Take 1  tablet by mouth 3 (three) times daily as needed for pain. 06/28/21   [provider]  pantoprazole (PROTONIX) 40 MG tablet Take 1 tablet (40 mg total) by mouth 2 (two) times daily before lunch and supper. 05/07/19   Barrett, Joline Salt, PA-C  promethazine (PHENERGAN) 12.5 MG tablet TAKE 1 TABLET (12.5 MG TOTAL) BY MOUTH EVERY 6 (SIX) HOURS AS NEEDED FOR NAUSEA OR VOMITING. 07/20/20   Laurey Morale, MD  quiNIDine sulfate 200 MG tablet Take 1 tablet (200 mg total) by mouth 2 (two) times daily. 01/27/23   Laurey Morale, MD  quiNIDine sulfate 200 MG tablet Take 1 tablet (200 mg total) by mouth in the morning and at bedtime. 01/27/23   Bensimhon, Bevelyn Buckles, MD  sevelamer carbonate (RENVELA) 0.8 g PACK packet Take 0.8 g by mouth 3 (three) times daily with meals. 06/16/19   Bhagat, Sharrell Ku, PA  triamcinolone ointment (KENALOG) 0.1 % Apply 1 Application topically 2 (two) times daily. 08/25/22   Bess Kinds, MD  warfarin (COUMADIN) 5 MG tablet Take 1 tablet (5 mg total) by mouth daily. 01/31/23   Sherald Hess, NP                                                                                                                                    Past Surgical History Past Surgical History:  Procedure Laterality Date   A/V SHUNTOGRAM N/A 05/24/2018   Procedure: A/V SHUNTOGRAM;  Surgeon: Cephus Shelling, MD;  Location: [facility] INVASIVE CV LAB;  Service: Cardiovascular;  Laterality: N/A;   ABDOMINAL AORTOGRAM W/LOWER EXTREMITY N/A 03/18/2019   Procedure: ABDOMINAL AORTOGRAM W/LOWER EXTREMITY;  Surgeon: Maeola Harman, MD;  Location: [facility] INVASIVE CV LAB;  Service: Cardiovascular;  Laterality: N/A;   AMPUTATION Right 03/21/2019   Procedure: AMPUTATION DIGIT RIGHT FOURTH TOE;  Surgeon: Maeola Harman, MD;  Location: [facility] OR;  Service: Vascular;  Laterality: Right;   AMPUTATION Left 05/29/2019   Procedure: AMPUTATION LEFT GREAT TOE;  Surgeon: Nada Libman, MD;  Location: [facility] OR;   Service: Vascular;  Laterality: Left;   AMPUTATION Right 06/02/2019   Procedure: ABOVE KNEE AMPUTATION;  Surgeon: Larina Earthly, MD;  Location: [facility] OR;  Service: Vascular;  Laterality: Right;   AMPUTATION Left 07/23/2019   Procedure: AMPUTATION ABOVE KNEE LEFT;  Surgeon: Sherren Kerns, MD;  Location: [facility] OR;  Service: Vascular;  Laterality: Left;   APPENDECTOMY     AV FISTULA PLACEMENT Right 06/26/2017   Procedure: ARTERIOVENOUS (AV) FISTULA CREATION VERSUS GRAFT RIGHT  ARM;  Surgeon: Fransisco Hertz, MD;  Location: [facility] OR;  Service: Vascular;  Laterality: Right;   AV FISTULA PLACEMENT Right 10/31/2017   Procedure: INSERTION OF ARTERIOVENOUS (AV) ARTEGRAFT,  RIGHT UPPER ARM;  Surgeon: Fransisco Hertz, MD;  Location: [facility] OR;  Service: Vascular;  Laterality: Right;   AV FISTULA PLACEMENT Left 05/29/2018   Procedure: ARTERIOVENOUS (AV) FISTULA CREATION  LEFT UPPER EXTREMITY;  Surgeon: Maeola Harman, MD;  Location: [facility] OR;  Service: Vascular;  Laterality: Left;   AV FISTULA PLACEMENT Left 08/09/2018   Procedure: INSERTION OF ARTERIOVENOUS (AV) GORE-TEX GRAFT LEFT UPPER ARM;  Surgeon: Maeola Harman, MD;  Location: [facility] OR;  Service: Vascular;  Laterality: Left;   CARDIAC DEFIBRILLATOR PLACEMENT  2010   St. Jude ICD   CENTRAL LINE INSERTION Right 05/24/2018   Procedure: CENTRAL LINE INSERTION;  Surgeon: Cephus Shelling, MD;  Location: [facility] INVASIVE CV LAB;  Service: Cardiovascular;  Laterality: Right;   COLECTOMY  ~ 2003   "for Crohn's" ascending colon.     CORONARY STENT INTERVENTION N/A 11/25/2016   Procedure: Coronary Stent Intervention;  Surgeon: Marykay Lex, MD;  Location: [facility] INVASIVE CV LAB;  Service: Cardiovascular;  Laterality: N/A;   EP IMPLANTABLE DEVICE N/A 09/14/2016   Procedure: ICD Generator Changeout;  Surgeon: Duke Salvia, MD;  Location: [facility] INVASIVE CV LAB;  Service: Cardiovascular;  Laterality: N/A;  ESOPHAGOGASTRODUODENOSCOPY N/A 07/12/2017   Procedure:  ESOPHAGOGASTRODUODENOSCOPY (EGD);  Surgeon: Beverley Fiedler, MD;  Location: [facility] ENDOSCOPY;  Service: Gastroenterology;  Laterality: N/A;  VAD pt so VAD team will need to accompany pt.    FETAL SURGERY FOR CONGENITAL HERNIA     ????? pt has no knowledge of this.[name]     FISTULOGRAM Left 08/09/2018   Procedure: FISTULOGRAM LEFT ARM;  Surgeon: Maeola Harman, MD;  Location: [facility] OR;  Service: Vascular;  Laterality: Left;   INGUINAL HERNIA REPAIR     INSERTION OF DIALYSIS CATHETER Right 06/26/2017   Procedure: INSERTION OF TUNNELED  DIALYSIS CATHETER RIGHT INTERNAL JUGULAR;  Surgeon: Fransisco Hertz, MD;  Location: [facility] OR;  Service: Vascular;  Laterality: Right;   INSERTION OF IMPLANTABLE LEFT VENTRICULAR ASSIST DEVICE N/A 06/12/2017   Procedure: INSERTION OF IMPLANTABLE LEFT VENTRICULAR ASSIST DEVICE-HEARTMATE 3;  Surgeon: Kerin Perna, MD;  Location: [facility] OR;  Service: Open Heart Surgery;  Laterality: N/A;  HM3 LVAD  CIRC ARREST  NITRIC OXIDE   INSERTION OF IMPLANTABLE LEFT VENTRICULAR ASSIST DEVICE N/A 02/28/2018   Procedure: REDO INSERTION OF IMPLANTABLE LEFT VENTRICULAR ASSIST DEVICE - HEARTMATE 3;  Surgeon: Kerin Perna, MD;  Location: [facility] OR;  Service: Open Heart Surgery;  Laterality: N/A;   IR FLUORO GUIDE CV LINE RIGHT  07/22/2019   IR REMOVAL TUN CV CATH W/O FL  02/26/2018   IR THORACENTESIS ASP PLEURAL SPACE W/IMG GUIDE  06/28/2017   IR US GUIDE VASC ACCESS RIGHT  07/22/2019   LIGATION OF ARTERIOVENOUS  FISTULA Right 10/31/2017   Procedure: LIGATION OF BRACHIO-BASILIC VEIN TRANSPOSITION RIGHT ARM;  Surgeon: Fransisco Hertz, MD;  Location: [facility] OR;  Service: Vascular;  Laterality: Right;   LOWER EXTREMITY ANGIOGRAPHY  05/02/2019   Procedure: Lower Extremity Angiography;  Surgeon: Cephus Shelling, MD;  Location: [facility] INVASIVE CV LAB;  Service: Cardiovascular;;   MULTIPLE EXTRACTIONS WITH ALVEOLOPLASTY N/A 06/08/2017   Procedure: MULTIPLE EXTRACTION WITH ALVEOLOPLASTY AND PRE PROSTHETIC  SURGERY AS NEEDED;  Surgeon: Charlynne Pander, DDS;  Location: [facility] OR;  Service: Oral Surgery;  Laterality: N/A;   PERIPHERAL VASCULAR BALLOON ANGIOPLASTY  03/18/2019   Procedure: PERIPHERAL VASCULAR BALLOON ANGIOPLASTY;  Surgeon: Maeola Harman, MD;  Location: [facility] INVASIVE CV LAB;  Service: Cardiovascular;;  RT PT   PERIPHERAL VASCULAR INTERVENTION  03/18/2019   Procedure: PERIPHERAL VASCULAR INTERVENTION;  Surgeon: Maeola Harman, MD;  Location: [facility] INVASIVE CV LAB;  Service: Cardiovascular;;  RT SFA   PERIPHERAL VASCULAR INTERVENTION  05/02/2019   Procedure: PERIPHERAL VASCULAR INTERVENTION;  Surgeon: Cephus Shelling, MD;  Location: [facility] INVASIVE CV LAB;  Service: Cardiovascular;;  Lt. SFA   RIGHT HEART CATH N/A 06/07/2017   Procedure: RIGHT HEART CATH;  Surgeon: Laurey Morale, MD;  Location: [facility] INVASIVE CV LAB;  Service: Cardiovascular;  Laterality: N/A;   RIGHT HEART CATH N/A 02/08/2018   Procedure: RIGHT HEART CATH;  Surgeon: Laurey Morale, MD;  Location: [facility] INVASIVE CV LAB;  Service: Cardiovascular;  Laterality: N/A;   RIGHT HEART CATH N/A 08/09/2018   Procedure: RIGHT HEART CATH;  Surgeon: Laurey Morale, MD;  Location: [facility] INVASIVE CV LAB;  Service: Cardiovascular;  Laterality: N/A;   RIGHT/LEFT HEART CATH AND CORONARY ANGIOGRAPHY N/A 11/23/2016   Procedure: Right/Left Heart Cath and Coronary Angiography;  Surgeon: Laurey Morale, MD;  Location: [facility] INVASIVE CV LAB;  Service: Cardiovascular;  Laterality: N/A;   TEE WITHOUT CARDIOVERSION N/A 06/12/2017   Procedure: TRANSESOPHAGEAL ECHOCARDIOGRAM (  TEE);  Surgeon: Kerin Perna, MD;  Location: [facility] OR;  Service: Open Heart Surgery;  Laterality: N/A;   TEE WITHOUT CARDIOVERSION N/A 02/28/2018   Procedure: TRANSESOPHAGEAL ECHOCARDIOGRAM (TEE);  Surgeon: Donata Clay, Theron Arista, MD;  Location: [facility] OR;  Service: Open Heart Surgery;  Laterality: N/A;   TEMPORARY DIALYSIS CATHETER Left 05/24/2018   Procedure: TEMPORARY DIALYSIS  CATHETER;  Surgeon: Cephus Shelling, MD;  Location: [facility] INVASIVE CV LAB;  Service: Cardiovascular;  Laterality: Left;   THROMBECTOMY AND REVISION OF ARTERIOVENTOUS (AV) GORETEX  GRAFT Right 12/12/2017   Procedure: THROMBECTOMY ARTERIOVENTOUS (AV) GORETEX  GRAFT RIGHT UPPER ARM;  Surgeon: Fransisco Hertz, MD;  Location: [facility] OR;  Service: Vascular;  Laterality: Right;   TRANSMETATARSAL AMPUTATION Right 04/30/2019   Procedure: RIGHT TRANSMETATARSAL AMPUTATION;  Surgeon: Maeola Harman, MD;  Location: [facility] OR;  Service: Vascular;  Laterality: Right;   Family History Family History  Problem Relation Age of Onset   Colon polyps Mother    Diabetes Mother    Heart attack Father    Diabetes Father    Stroke Paternal Grandfather    Colitis Sister    Heart disease Brother    Colitis Sister    Colon cancer Neg Hx     Social History Social History   Tobacco Use   Smoking status: Never    Passive exposure: Past   Smokeless tobacco: Never  Vaping Use   Vaping Use: Never used  Substance Use Topics   Alcohol use: No   Drug use: No   Allergies Metformin and related  Review of Systems Review of Systems  All other systems reviewed and are negative.   Physical Exam Vital Signs  I have reviewed the triage vital signs BP 127/88   Pulse 63   Temp 98.1 F (36.7 C) (Oral)   Resp 11   SpO2 100%  Physical Exam Vitals and nursing note reviewed.  Constitutional:      General: He is not in acute distress.    Comments: Somnolent, arousable to voice, no acute distress  HENT:     Head: Normocephalic and atraumatic.     Mouth/Throat:     Mouth: Mucous membranes are moist.  Eyes:     Conjunctiva/sclera: Conjunctivae normal.  Cardiovascular:     Rate and Rhythm: Normal rate.     Comments: LVAD mechanical heart sounds Pulmonary:     Effort: Pulmonary effort is normal. No respiratory distress.     Comments: Difficult to appreciate fine pulmonary sounds due to LVAD, no obvious  wheezing or crackles Abdominal:     General: Abdomen is flat.     Palpations: Abdomen is soft.     Tenderness: There is no abdominal tenderness.  Musculoskeletal:     Comments: Bilateral lower extremity amputation  Skin:    General: Skin is warm and dry.     Capillary Refill: Capillary refill takes less than 2 seconds.  Neurological:     Mental Status: He is oriented to person, place, and time.     Comments: Moves all 4 extremities, no cranial nerve deficit, somnolent but arousable and oriented  Psychiatric:        Mood and Affect: Mood normal.        Behavior: Behavior normal.     ED Results and Treatments Labs (all labs ordered are listed, but only abnormal results are displayed) Labs Reviewed  COMPREHENSIVE METABOLIC PANEL - Abnormal; Notable for the following components:      Result Value  Sodium 132 (*)    Potassium 2.7 (*)    Chloride 93 (*)    Glucose, Bld 63 (*)    Creatinine, Ser 6.13 (*)    Calcium 7.6 (*)    Total Protein 5.9 (*)    Albumin 2.7 (*)    GFR, Estimated 10 (*)    All other components within normal limits  CBC WITH DIFFERENTIAL/PLATELET - Abnormal; Notable for the following components:   RBC 3.06 (*)    Hemoglobin 8.8 (*)    HCT 26.9 (*)    RDW 17.3 (*)    Platelets 127 (*)    All other components within normal limits  BRAIN NATRIURETIC PEPTIDE - Abnormal; Notable for the following components:   B Natriuretic Peptide 1,200.6 (*)    All other components within normal limits  PROTIME-INR - Abnormal; Notable for the following components:   Prothrombin Time 43.5 (*)    INR 4.6 (*)    All other components within normal limits  I-STAT VENOUS BLOOD GAS, ED - Abnormal; Notable for the following components:   pO2, Ven 49 (*)    Bicarbonate 31.5 (*)    TCO2 33 (*)    Acid-Base Excess 6.0 (*)    Sodium 134 (*)    Potassium 2.8 (*)    Calcium, Ion 0.99 (*)    HCT 30.0 (*)    Hemoglobin 10.2 (*)    All other components within normal limits   TROPONIN I (HIGH SENSITIVITY) - Abnormal; Notable for the following components:   Troponin I (High Sensitivity) 126 (*)    All other components within normal limits  TROPONIN I (HIGH SENSITIVITY) - Abnormal; Notable for the following components:   Troponin I (High Sensitivity) 136 (*)    All other components within normal limits  CULTURE, BLOOD (ROUTINE X 2)  CULTURE, BLOOD (ROUTINE X 2)  LACTIC ACID, PLASMA  MAGNESIUM  LACTIC ACID, PLASMA  PROTIME-INR  LACTATE DEHYDROGENASE  CBG MONITORING, ED                                                                                                                          Radiology ECHOCARDIOGRAM LIMITED  Result Date: 02/03/2023    ECHOCARDIOGRAM LIMITED REPORT   Patient Name:   [name] Date of Exam: 02/03/2023 Medical Rec #:  [id number]          Height:       68.0 in Accession #:    [id number]         Weight:       181.0 lb Date of Birth:  24-Oct-1965          BSA:          1.959 m Patient Age:    57 years           BP:           0/0 mmHg Patient Gender: M  HR:           68 bpm. Exam Location:  Inpatient Procedure: Limited Echo, Color Doppler and Cardiac Doppler Indications:    LVAD Evaluation  History:        Patient has prior history of Echocardiogram examinations, most                 recent 01/24/2023. CHF, CAD; Risk Factors:Hypertension, Diabetes                 and Dyslipidemia.  Sonographer:    Irving Burton Senior RDCS Referring Phys: [id number] Roxy Horseman SIMMONS  Sonographer Comments: Heart Mate III, ramp performed 10 days prior IMPRESSIONS  1. Left ventricular ejection fraction, by estimation, is <20%. The left ventricle has severely decreased function. The left ventricle demonstrates global hypokinesis. There is the interventricular septum is flattened in systole and diastole, consistent with right ventricular pressure and volume overload.  2. Right ventricular systolic function is severely reduced. The right ventricular size is  moderately enlarged. There is normal pulmonary artery systolic pressure.  3. No evidence of mitral valve regurgitation.  4. Aortic valve regurgitation is moderate.  5. The inferior vena cava is dilated in size with <50% respiratory variability, suggesting right atrial pressure of 15 mmHg. FINDINGS  Left Ventricle: HM III LVAD present. Flow 2.7, PI 10.2, Power 4.0, Speed 5300 rpm. Left ventricular ejection fraction, by estimation, is <20%. The left ventricle has severely decreased function. The left ventricle demonstrates global hypokinesis. The interventricular septum is flattened in systole and diastole, consistent with right ventricular pressure and volume overload. Right Ventricle: RVOT VTI 8.6cm. The right ventricular size is moderately enlarged. Right ventricular systolic function is severely reduced. There is normal pulmonary artery systolic pressure. The tricuspid regurgitant velocity is 1.95 m/s, and with an assumed right atrial pressure of 15 mmHg, the estimated right ventricular systolic pressure is 30.2 mmHg. Tricuspid Valve: Tricuspid valve regurgitation is mild. Aortic Valve: Aortic Valve opens 0/5. Aortic valve regurgitation is moderate. Venous: The inferior vena cava is dilated in size with less than 50% respiratory variability, suggesting right atrial pressure of 15 mmHg. Additional Comments: A device lead is visualized. Spectral Doppler performed. Color Doppler performed.  LEFT VENTRICLE PLAX 2D LVIDs:         4.70 cm  RIGHT VENTRICLE            IVC RV S prime:     3.08 cm/s  IVC diam: 2.30 cm TAPSE (M-mode): 0.7 cm TRICUSPID VALVE TR Peak grad:   15.2 mmHg TR Vmax:        195.00 cm/s Chilton Si MD Electronically signed by Chilton Si MD Signature Date/Time: 02/03/2023/11:01:47 AM    Final    CT CHEST ABDOMEN PELVIS WO CONTRAST  Result Date: 02/03/2023 CLINICAL DATA:  Respiratory illness, nondiagnostic x-ray. EXAM: CT CHEST, ABDOMEN AND PELVIS WITHOUT CONTRAST TECHNIQUE: Multidetector  CT imaging of the chest, abdomen and pelvis was performed following the standard protocol without IV contrast. RADIATION DOSE REDUCTION: This exam was performed according to the departmental dose-optimization program which includes automated exposure control, adjustment of the mA and/or kV according to patient size and/or use of iterative reconstruction technique. COMPARISON:  One view chest x-ray 02/03/2023. CT chest, abdomen and pelvis without contrast 11/01/2022. CT chest with contrast 02/28/2018. FINDINGS: CT CHEST FINDINGS Cardiovascular: The heart is enlarged. A left ventricular assist device is again noted. Dense coronary artery calcifications are present. Extensive atherosclerotic calcifications are also present at the aortic arch great vessel  origins. No aneurysm is present. Pulmonary arteries are unremarkable. Left chest ICD is again noted. Mediastinum/Nodes: Small nodes are stable. The esophagus is mildly distended proximally without any mass lesion visualized. No significant interval change is present. Lungs/Pleura: A 5 mm nodule in the left upper lobe is stable since the February exam. It has increased proximally 1 cm in average axial dimension since 2019. The superior segment right lower lobe nodule is obscured by airspace disease. No other focal nodule is present. Moderate dependent airspace disease is present bilaterally. No significant pleural effusions are present. The airways are patent. Musculoskeletal: Median sternotomy again noted. No chest wall mass or suspicious bone lesion is present. CT ABDOMEN PELVIS FINDINGS Hepatobiliary: No focal liver abnormality is seen. No gallstones, gallbladder wall thickening, or biliary dilatation. Pancreas: Scattered calcifications are again noted. No discrete mass lesion is present. Spleen: Normal in size without focal abnormality. Adrenals/Urinary Tract: Adrenal glands are normal bilaterally. Scattered parenchymal calcifications are stable. Some of these  calcifications are vascular. The ureters are within normal limits. Diffuse bladder wall thickening is increased since the prior exam. Some inflammatory changes are present about the urinary bladder. Stomach/Bowel: The stomach and duodenum within normal limits. Small bowel is unremarkable. Partial right hemicolectomy noted. The anastomosis is intact. Transverse colon is unremarkable. The descending and sigmoid colon are within normal limits. Vascular/Lymphatic: Extensive atherosclerotic calcifications are again noted. No aneurysm is present. No significant adenopathy is present. Reproductive: Prostate is unremarkable. The seminal vesicles are calcified. Other: A small paraumbilical hernia contains fat without bowel. No other significant ventral hernias are present. The left ventricular assist device drive line is again noted. No free fluid or free air is present. Musculoskeletal: 2 vertebral body heights and alignment are normal. No acute or focal osseous abnormality is present. The hips are located and normal. IMPRESSION: 1. Diffuse bladder wall thickening with some inflammatory changes about the urinary bladder. This is concerning for cystitis. 2. Stable 5 mm nodule in the left upper lobe since the February exam. It has increased proximally 1 cm in average axial dimension since 2019. No follow-up needed if patient is low-risk (and has no known or suspected primary neoplasm). Non-contrast chest CT can be considered in 12 months if patient is high-risk. This recommendation follows the consensus statement: Guidelines for Management of Incidental Pulmonary Nodules Detected on CT Images: From the Fleischner Society 2017; Radiology 2017; 284:228-243. 3. The superior segment nodule in the right lower lobe nodule is obscured by airspace disease. 4. Cardiomegaly with left ventricular assist device and left chest ICD. 5. Coronary artery disease. 6. Small paraumbilical hernia contains fat without bowel. 7.  Aortic  Atherosclerosis (ICD10-I70.0). Electronically Signed   By: Marin Roberts M.D.   On: 02/03/2023 10:49   DG Chest Port 1 View  Result Date: 02/03/2023 CLINICAL DATA:  Cough. EXAM: PORTABLE CHEST 1 VIEW COMPARISON:  November 08, 2022. FINDINGS: Stable cardiomediastinal silhouette. Left ventricular assist device is unchanged. Left internal jugular dialysis catheter is unchanged. Left-sided defibrillator is unchanged. Mild bibasilar subsegmental atelectasis is noted. Bony thorax is unremarkable. IMPRESSION: Stable support apparatus including left ventricular assist device. Mild bibasilar subsegmental atelectasis. Electronically Signed   By: Lupita Raider M.D.   On: 02/03/2023 09:28   CT Head Wo Contrast  Result Date: 02/03/2023 CLINICAL DATA:  Mental status change, unknown cause EXAM: CT HEAD WITHOUT CONTRAST TECHNIQUE: Contiguous axial images were obtained from the base of the skull through the vertex without intravenous contrast. RADIATION DOSE REDUCTION: This exam was  performed according to the departmental dose-optimization program which includes automated exposure control, adjustment of the mA and/or kV according to patient size and/or use of iterative reconstruction technique. COMPARISON:  CT head July 20, 2021. FINDINGS: Brain: No evidence of acute infarction, hemorrhage, hydrocephalus, extra-axial collection or mass lesion/mass effect. Remote left cerebellar infarcts. Patchy white matter hypodensities, nonspecific but compatible with chronic microvascular ischemic change. Vascular: No hyperdense vessel identified. Skull: No acute fracture. Sinuses/Orbits: Largely clear sinuses.  No acute orbital findings. Other: No mastoid effusions. IMPRESSION: No evidence of acute intracranial abnormality. Electronically Signed   By: Feliberto Harts M.D.   On: 02/03/2023 09:16    Pertinent labs & imaging results that were available during my care of the patient were reviewed by me and considered in my  medical decision making (see MDM for details).  Medications Ordered in ED Medications  magnesium sulfate IVPB 1 g 100 mL (1 g Intravenous New Bag/Given 02/03/23 1017)  acetaminophen (TYLENOL) tablet 650 mg (has no administration in time range)  amiodarone (PACERONE) tablet 400 mg (has no administration in time range)  aspirin EC tablet 81 mg (has no administration in time range)  atorvastatin (LIPITOR) tablet 40 mg (has no administration in time range)  citalopram (CELEXA) tablet 20 mg (has no administration in time range)  levocetirizine (XYZAL) tablet 5 mg (has no administration in time range)  midodrine (PROAMATINE) tablet 10 mg (has no administration in time range)  pantoprazole (PROTONIX) EC tablet 40 mg (has no administration in time range)  quiNIDine sulfate tablet 200 mg (has no administration in time range)  sevelamer carbonate (RENVELA) packet 0.8 g (has no administration in time range)  dextrose 50 % solution 25 mL (has no administration in time range)  sodium chloride 0.9 % bolus 500 mL (0 mLs Intravenous Stopped 02/03/23 0945)  potassium chloride (KLOR-CON) packet 40 mEq (40 mEq Oral Given 02/03/23 1005)  lactated ringers bolus 250 mL (250 mLs Intravenous New Bag/Given 02/03/23 1033)                                                                                                                                     Procedures .Critical Care  Performed by: Lonell Grandchild, MD Authorized by: Lonell Grandchild, MD   Critical care provider statement:    Critical care time (minutes):  30   Critical care was necessary to treat or prevent imminent or life-threatening deterioration of the following conditions:  Circulatory failure and cardiac failure   Critical care was time spent personally by me on the following activities:  Development of treatment plan with patient or surrogate, discussions with consultants, evaluation of patient's response to treatment, examination of patient,  ordering and review of laboratory studies, ordering and review of radiographic studies, ordering and performing treatments and interventions, pulse oximetry, re-evaluation of patient's condition and review of old charts   Care discussed with: admitting provider   Ultrasound  ED Peripheral IV (Provider)  Date/Time: 02/03/2023 11:12 AM  Performed by: Lonell Grandchild, MD Authorized by: Lonell Grandchild, MD   Procedure details:    Indications: hydration, multiple failed IV attempts and poor IV access     Skin Prep: chlorhexidine gluconate     Location:  Right AC   Angiocath:  20 G   Bedside Ultrasound Guided: Yes     Images: not archived     Patient tolerated procedure without complications: Yes     Dressing applied: Yes     (including critical care time)  Medical Decision Making / ED Course   MDM:  57 year old male presenting to the emergency department with weakness and LVAD alarm.  Differential includes low flow state, infection, hypovolemia, electrolyte abnormality, less likely CHF, no respiratory distress and stable on chronic oxygen, less likely arrhythmia, appears in sinus rhythm on monitor but will check EKG. will discuss with LVAD team.  Given somnolence although patient denies headache and is oriented, will obtain CT head.  Will give small fluid bolus.  Will obtain blood cultures.  Clinical Course as of 02/03/23 1117  Fri Feb 03, 2023  4098 Patient's daughter reports that he has been sleepier than normal for the past 2 days and has had some nausea and vomiting.  She does not think he hit his head or fell.  No fevers or other new symptoms. [WS]  1191 Discussed with Mallick, cardiology PA, they will admit for his metabolic abnormalities  [WS]  4782 Patient admitted to cardiology service. CT scan with some bladder wall thickening likely due to underlying end stage renal disease rather than infection as he reports he doesn't make urine, lung nodule,  possible airspace disease?  No leukocytosis or fever, tachycardia to suggest infection, although possible, will be monitored in hospital. Will defer antibiotic decision to cardiology team [WS]    Clinical Course User Index [WS] Lonell Grandchild, MD     Additional history obtained: -Additional history obtained from ems -External records from outside source obtained and reviewed including: Chart review including previous notes, labs, imaging, consultation notes including recent hospitalization for arrythmia    Lab Tests: -I ordered, reviewed, and interpreted labs.   The pertinent results include:   Labs Reviewed  COMPREHENSIVE METABOLIC PANEL - Abnormal; Notable for the following components:      Result Value   Sodium 132 (*)    Potassium 2.7 (*)    Chloride 93 (*)    Glucose, Bld 63 (*)    Creatinine, Ser 6.13 (*)    Calcium 7.6 (*)    Total Protein 5.9 (*)    Albumin 2.7 (*)    GFR, Estimated 10 (*)    All other components within normal limits  CBC WITH DIFFERENTIAL/PLATELET - Abnormal; Notable for the following components:   RBC 3.06 (*)    Hemoglobin 8.8 (*)    HCT 26.9 (*)    RDW 17.3 (*)    Platelets 127 (*)    All other components within normal limits  BRAIN NATRIURETIC PEPTIDE - Abnormal; Notable for the following components:   B Natriuretic Peptide 1,200.6 (*)    All other components within normal limits  PROTIME-INR - Abnormal; Notable for the following components:   Prothrombin Time 43.5 (*)    INR 4.6 (*)    All other components within normal limits  I-STAT VENOUS BLOOD GAS, ED - Abnormal; Notable for the following components:   pO2, Ven 49 (*)    Bicarbonate  31.5 (*)    TCO2 33 (*)    Acid-Base Excess 6.0 (*)    Sodium 134 (*)    Potassium 2.8 (*)    Calcium, Ion 0.99 (*)    HCT 30.0 (*)    Hemoglobin 10.2 (*)    All other components within normal limits  TROPONIN I (HIGH SENSITIVITY) - Abnormal; Notable for the following components:   Troponin I (High Sensitivity) 126 (*)     All other components within normal limits  TROPONIN I (HIGH SENSITIVITY) - Abnormal; Notable for the following components:   Troponin I (High Sensitivity) 136 (*)    All other components within normal limits  CULTURE, BLOOD (ROUTINE X 2)  CULTURE, BLOOD (ROUTINE X 2)  LACTIC ACID, PLASMA  MAGNESIUM  LACTIC ACID, PLASMA  PROTIME-INR  LACTATE DEHYDROGENASE  CBG MONITORING, ED    Notable for elevated troponin, mild hypoglycemia, hypokalemia, chronic anemia, changes of chronic end stage renal disease, no leukocytosis  EKG   EKG Interpretation  Date/Time:  Friday Feb 03 2023 09:16:33 EDT Ventricular Rate:  99 PR Interval:  160 QRS Duration: 221 QT Interval:    QTC Calculation:   R Axis:   231 Text Interpretation: VENTRICULAR PACED RHYTHM Artifact in lead(s) I II III aVR aVL aVF V1 V2 V3 V4 V5 V6 Confirmed by Alvino Blood ([id number]) on 02/03/2023 9:26:02 AM         Imaging Studies ordered: I ordered imaging studies including CT chest/Abd/pelvis On my interpretation imaging demonstrates ?airspace disease I independently visualized and interpreted imaging. I agree with the radiologist interpretation   Medicines ordered and prescription drug management: Meds ordered this encounter  Medications   sodium chloride 0.9 % bolus 500 mL   potassium chloride (KLOR-CON) packet 40 mEq   DISCONTD: cefTRIAXone (ROCEPHIN) 2 g in sodium chloride 0.9 % 100 mL IVPB    Order Specific Question:   Antibiotic Indication:    Answer:   CAP   DISCONTD: azithromycin (ZITHROMAX) 500 mg in sodium chloride 0.9 % 250 mL IVPB   magnesium sulfate IVPB 1 g 100 mL   lactated ringers bolus 250 mL   acetaminophen (TYLENOL) tablet 650 mg   amiodarone (PACERONE) tablet 400 mg   aspirin EC tablet 81 mg   atorvastatin (LIPITOR) tablet 40 mg   citalopram (CELEXA) tablet 20 mg   levocetirizine (XYZAL) tablet 5 mg   midodrine (PROAMATINE) tablet 10 mg   pantoprazole (PROTONIX) EC tablet 40 mg   quiNIDine  sulfate tablet 200 mg   sevelamer carbonate (RENVELA) packet 0.8 g   dextrose 50 % solution 25 mL    -I have reviewed the patients home medicines and have made adjustments as needed   Consultations Obtained: I requested consultation with the cardiologist,  and discussed lab and imaging findings as well as pertinent plan - they recommend: will admit to their service   Cardiac Monitoring: The patient was maintained on a cardiac monitor.  I personally viewed and interpreted the cardiac monitored which showed an underlying rhythm of: paced rhyhtm  Social Determinants of Health:  Diagnosis or treatment significantly limited by social determinants of health: obesity   Reevaluation: After the interventions noted above, I reevaluated the patient and found that their symptoms have improved  Co morbidities that complicate the patient evaluation  Past Medical History:  Diagnosis Date   Angina    ASCVD (arteriosclerotic cardiovascular disease)    , Anterior infarction 2005, LAD diagonal bifurcation intervention 03/2004   Automatic implantable cardiac  defibrillator -[employer]        Benign neoplasm of colon    CHF (congestive heart failure) (HCC)    Chronic systolic heart failure (HCC)    Coronary artery disease     Widely patent previously placed stents in the left anterior    Crohn's disease (HCC)    Deep venous thrombosis (HCC)    Recurrent-on Coumadin   Dialysis patient (HCC)    Dyspnea    Gastroparesis    GERD (gastroesophageal reflux disease)    High cholesterol    Hyperlipidemia    Hypersomnolent    Previous diagnosis of narcolepsy   Hypertension, essential    Ischemic cardiomyopathy    Ejection fraction 15-20% catheterization 2010   Type II or unspecified type diabetes mellitus without mention of complication, not stated as uncontrolled    Unspecified gastritis and gastroduodenitis without mention of hemorrhage       Dispostion: Disposition decision including need  for hospitalization was considered, and patient admitted to the hospital.    Final Clinical Impression(s) / ED Diagnoses Final diagnoses:  Complication involving left ventricular assist device (LVAD), initial encounter  Altered mental status, unspecified altered mental status type     This chart was dictated using voice recognition software.  Despite best efforts to proofread,  errors can occur which can change the documentation meaning.    Lonell Grandchild, MD 02/03/23 [phone]

## 2023-02-03 NOTE — Progress Notes (Signed)
Patient ID: [name], male   DOB: 1966-01-14, 57 y.o.   MRN: [id number]  [facility] Progress Note   Subjective:   No c/o's today, may be going home   Objective:   BP (!) 144/112   Pulse 76   Temp 97.6 F (36.4 C) (Oral)   Resp 14   SpO2 100%   Physical Exam: Gen: Appears comfortable resting in bed, awakens easily to conversation CVS: Pulse regular rhythm with normal rate, LVAD hum audible over the precordium Resp: Clear to auscultation bilaterally, no distinct rales or rhonchi Abd: Soft, obese, nontender, bowel sounds normal Ext: Status post bilateral AKA    Home meds - colace, hydralazine 25mg  tid on non-hd days, insulin glargine, ativan, reglan 10 tid, renavite, percocet, coumadin 5 qd, cefadroxil 1 gm po tiw after mwf hd, quinidine 200 bid, amiodarone, aspirin, lipitor, celexa, midodrine 10mg  pre HD mwf, protonix, renvela 800 mg ac tid, prns/ vits/ supps    OP HD: MWF [facility]  4h  400/1.5  77kg  2/2 bath  P4  Heparin 7000   [facility] - last HD today 5/31, had 45 min HD, BP's dropped taken off - low IDWG's - hectorol 5 mcg IV tiw - sensipar 180 mg po tiw - mircera 60 mcg IV q 2 wks, last dose on 5/22, due 6/05    Assessment/ Plan:   Hypotension/ gen'd weakness: in LVAD patient on HD. Had 1 hr dialysis w/ low BP's and was cut short today. In ED w/u neg except echo showed worsening RHF. IV abx have been started in case of infection. IVF's were given in ED about 1 L.  Refractory VT: just admitted here for persistent VT recently. He didn't respond to amiodarone + mexiletine so was transitioned to amio/ quinidine and the VT stabilized.  Severe ischemic cardiomyopathy/congestive heart failure with reduced ejection fraction: Status post HeartMate 3 LVAD and ongoing anticoagulation with Coumadin and blood pressure support with midodrine.  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 ESRD: usual HD MWF. Had 1 hr HD  today. Labs are okay. Plan is for shortened HD tomorrow.  Volume: is up by wts about 4-5kg, puffy UE's on exam. UF goal 2- 2.5 L w/ HD Sat.  Anemia: Hb 10.5, next esa due on 6/05. Follow.  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 Cont IV vdra and po sensipar.  Chronic hypotension: was on pre hd midodrine 10mg  3x per wk, here has been ^'d to midodrine 10 tid.   Vinson Moselle, MD  [facility]  02/03/2023, 4:13 PM  Recent Labs  Lab 01/28/23 0408 01/28/23 0409 01/30/23 0106 02/03/23 0825 02/03/23 0900  HGB  --    < > 9.3* 8.8* 10.2*  ALBUMIN  --   --   --  2.7*  --   CALCIUM  --    < > 9.0 7.6*  --   PHOS 4.8*  --   --   --   --   CREATININE  --    < > 9.23* 6.13*  --   K  --    < > 4.6 2.7* 2.8*   < > = values in this interval not displayed.    Inpatient medications:  [START ON 02/04/2023] amiodarone  400 mg Oral Daily   aspirin EC  81 mg Oral Daily   atorvastatin  40 mg Oral q1800   cefadroxil  1,000 mg Oral Q M,W,F-1800   citalopram  20  mg Oral Daily   [START ON 02/04/2023] loratadine  10 mg Oral Daily   [START ON 02/06/2023] midodrine  10 mg Oral Q M,W,F   pantoprazole  40 mg Oral BID AC   quiNIDine sulfate  200 mg Oral BID   sevelamer carbonate  0.8 g Oral TID WC    acetaminophen, ondansetron (ZOFRAN) IV

## 2023-02-03 NOTE — ED Notes (Signed)
Dr. Suezanne Jacquet notified of elevated INR

## 2023-02-03 NOTE — ED Notes (Signed)
Patient transported to CT 

## 2023-02-03 NOTE — ED Notes (Signed)
Annice Pih, LVAD team social worker, at bedside

## 2023-02-03 NOTE — ED Notes (Signed)
ED TO INPATIENT HANDOFF REPORT  ED Nurse Name and Phone #:   S Name/Age/Gender [name] 57 y.o. male Room/Bed: TRACC/TRACC  Code Status   Code Status: Partial Code  Home/SNF/Other Home Patient oriented to: self, place, time, and situation Is this baseline? Yes   Triage Complete: Triage complete  Chief Complaint AMS (altered mental status) [R41.82]  Triage Note Pt bib ems from dialysis; 20 min in to treatment, took less than 200 mls off; alarm went off on lvad; stopped treatment; called 911 and lvad coordinator; could not specify what alarm indicated; staff endorses more lethargic than normal; normally able to carry on conversation; alert to verbal stimuli; no alarm at present; more lethargic than normal; sinus rhythm on monitor; answers questions appropriately BP initially 88/64, last bp 128/70; icu last week with vtach;endorses cough x 6 months; denies fevers; 3 L at baseline, 100%; cbg 95; HR 76   Allergies Allergies  Allergen Reactions   Metformin And Related Diarrhea    Level of Care/Admitting Diagnosis ED Disposition     ED Disposition  Admit   Condition  --   Comment  Hospital Area: [facility] [[id number]]  Level of Care: Progressive [102]  Admit to Progressive based on following criteria: CARDIOVASCULAR & THORACIC of moderate stability with acute coronary syndrome symptoms/low risk myocardial infarction/hypertensive urgency/arrhythmias/heart failure potentially compromising stability and stable post cardiovascular intervention patients.  May admit patient to [facility] or [facility] if equivalent level of care is available:: No  Covid Evaluation: Asymptomatic - no recent exposure (last 10 days) testing not required  Diagnosis: AMS (altered mental status) [[id number]]  Admitting Physician: Dorthula Nettles [[id number]]  Attending Physician: Dorthula Nettles [phone]  Bed request comments: 2C  Certification:: I certify this patient will need  inpatient services for at least 2 midnights  Estimated Length of Stay: 3          B Medical/Surgery History Past Medical History:  Diagnosis Date   Angina    ASCVD (arteriosclerotic cardiovascular disease)    , Anterior infarction 2005, LAD diagonal bifurcation intervention 03/2004   Automatic implantable cardiac defibrillator -St. Jude's        Benign neoplasm of colon    CHF (congestive heart failure) (HCC)    Chronic systolic heart failure (HCC)    Coronary artery disease     Widely patent previously placed stents in the left anterior    Crohn's disease (HCC)    Deep venous thrombosis (HCC)    Recurrent-on Coumadin   Dialysis patient (HCC)    Dyspnea    Gastroparesis    GERD (gastroesophageal reflux disease)    High cholesterol    Hyperlipidemia    Hypersomnolent    Previous diagnosis of narcolepsy   Hypertension, essential    Ischemic cardiomyopathy    Ejection fraction 15-20% catheterization 2010   Type II or unspecified type diabetes mellitus without mention of complication, not stated as uncontrolled    Unspecified gastritis and gastroduodenitis without mention of hemorrhage    Past Surgical History:  Procedure Laterality Date   A/V SHUNTOGRAM N/A 05/24/2018   Procedure: A/V SHUNTOGRAM;  Surgeon: Cephus Shelling, MD;  [facility] INVASIVE CV LAB;  Service: Cardiovascular;  Laterality: N/A;   ABDOMINAL AORTOGRAM W/LOWER EXTREMITY N/A 03/18/2019   Procedure: ABDOMINAL AORTOGRAM W/LOWER EXTREMITY;  Surgeon: Maeola Harman, MD;  Location: [facility] INVASIVE CV LAB;  Service: Cardiovascular;  Laterality: N/A;   AMPUTATION Right 03/21/2019   Procedure: AMPUTATION DIGIT RIGHT FOURTH  TOE;  Surgeon: Maeola Harman, MD;  Location: [facility] OR;  Service: Vascular;  Laterality: Right;   AMPUTATION Left 05/29/2019   Procedure: AMPUTATION LEFT GREAT TOE;  Surgeon: Nada Libman, MD;  Location: [facility] OR;  Service: Vascular;  Laterality: Left;   AMPUTATION Right  06/02/2019   Procedure: ABOVE KNEE AMPUTATION;  Surgeon: Larina Earthly, MD;  Location: [facility] OR;  Service: Vascular;  Laterality: Right;   AMPUTATION Left 07/23/2019   Procedure: AMPUTATION ABOVE KNEE LEFT;  Surgeon: Sherren Kerns, MD;  Location: [facility] OR;  Service: Vascular;  Laterality: Left;   APPENDECTOMY     AV FISTULA PLACEMENT Right 06/26/2017   Procedure: ARTERIOVENOUS (AV) FISTULA CREATION VERSUS GRAFT RIGHT  ARM;  Surgeon: Fransisco Hertz, MD;  Location: [facility] OR;  Service: Vascular;  Laterality: Right;   AV FISTULA PLACEMENT Right 10/31/2017   Procedure: INSERTION OF ARTERIOVENOUS (AV) ARTEGRAFT,  RIGHT UPPER ARM;  Surgeon: Fransisco Hertz, MD;  Location: [facility] OR;  Service: Vascular;  Laterality: Right;   AV FISTULA PLACEMENT Left 05/29/2018   Procedure: ARTERIOVENOUS (AV) FISTULA CREATION  LEFT UPPER EXTREMITY;  Surgeon: Maeola Harman, MD;  Location: [facility] OR;  Service: Vascular;  Laterality: Left;   AV FISTULA PLACEMENT Left 08/09/2018   Procedure: INSERTION OF ARTERIOVENOUS (AV) GORE-TEX GRAFT LEFT UPPER ARM;  Surgeon: Maeola Harman, MD;  Location: [facility] OR;  Service: Vascular;  Laterality: Left;   CARDIAC DEFIBRILLATOR PLACEMENT  2010   St. Jude ICD   CENTRAL LINE INSERTION Right 05/24/2018   Procedure: CENTRAL LINE INSERTION;  Surgeon: Cephus Shelling, MD;  [facility] CV LAB;  Service: Cardiovascular;  Laterality: Right;   COLECTOMY  ~ 2003   "for Crohn's" ascending colon.     CORONARY STENT INTERVENTION N/A 11/25/2016   Procedure: Coronary Stent Intervention;  Surgeon: Marykay Lex, MD;  Location: [facility] INVASIVE CV LAB;  Service: Cardiovascular;  Laterality: N/A;   EP IMPLANTABLE DEVICE N/A 09/14/2016   Procedure: ICD Generator Changeout;  Surgeon: Duke Salvia, MD;  Location: [facility] INVASIVE CV LAB;  Service: Cardiovascular;  Laterality: N/A;   ESOPHAGOGASTRODUODENOSCOPY N/A 07/12/2017   Procedure: ESOPHAGOGASTRODUODENOSCOPY (EGD);  Surgeon: Beverley Fiedler, MD;   Location: [facility] ENDOSCOPY;  Service: Gastroenterology;  Laterality: N/A;  VAD pt so VAD team will need to accompany pt.    FETAL SURGERY FOR CONGENITAL HERNIA     ????? pt has no knowledge of this.[name]     FISTULOGRAM Left 08/09/2018   Procedure: FISTULOGRAM LEFT ARM;  Surgeon: Maeola Harman, MD;  Location: [facility] OR;  Service: Vascular;  Laterality: Left;   INGUINAL HERNIA REPAIR     INSERTION OF DIALYSIS CATHETER Right 06/26/2017   Procedure: INSERTION OF TUNNELED  DIALYSIS CATHETER RIGHT INTERNAL JUGULAR;  Surgeon: Fransisco Hertz, MD;  Location[facility] OR;  Service: Vascular;  Laterality: Right;   INSERTION OF IMPLANTABLE LEFT VENTRICULAR ASSIST DEVICE N/A 06/12/2017   Procedure: INSERTION OF IMPLANTABLE LEFT VENTRICULAR ASSIST DEVICE-HEARTMATE 3;  Surgeon: Kerin Perna, MD;  Location: [facility] OR;  Service: Open Heart Surgery;  Laterality: N/A;  HM3 LVAD  CIRC ARREST  NITRIC OXIDE   INSERTION OF IMPLANTABLE LEFT VENTRICULAR ASSIST DEVICE N/A 02/28/2018   Procedure: REDO INSERTION OF IMPLANTABLE LEFT VENTRICULAR ASSIST DEVICE - HEARTMATE 3;  Surgeon: Kerin Perna, MD;  Location: [facility] OR;  Service: Open Heart Surgery;  Laterality: N/A;   IR FLUORO GUIDE CV LINE RIGHT  07/22/2019   IR REMOVAL TUN  CV CATH W/O FL  02/26/2018   IR THORACENTESIS ASP PLEURAL SPACE W/IMG GUIDE  06/28/2017   IR US GUIDE VASC ACCESS RIGHT  07/22/2019   LIGATION OF ARTERIOVENOUS  FISTULA Right 10/31/2017   Procedure: LIGATION OF BRACHIO-BASILIC VEIN TRANSPOSITION RIGHT ARM;  Surgeon: Fransisco Hertz, MD;  Location: [facility] OR;  Service: Vascular;  Laterality: Right;   LOWER EXTREMITY ANGIOGRAPHY  05/02/2019   Procedure: Lower Extremity Angiography;  Surgeon: Cephus Shelling, MD;  Location: [facility] INVASIVE CV LAB;  Service: Cardiovascular;;   MULTIPLE EXTRACTIONS WITH ALVEOLOPLASTY N/A 06/08/2017   Procedure: MULTIPLE EXTRACTION WITH ALVEOLOPLASTY AND PRE PROSTHETIC SURGERY AS NEEDED;  Surgeon: Charlynne Pander, DDS;  Location: [facility]  OR;  Service: Oral Surgery;  Laterality: N/A;   PERIPHERAL VASCULAR BALLOON ANGIOPLASTY  03/18/2019   Procedure: PERIPHERAL VASCULAR BALLOON ANGIOPLASTY;  Surgeon: Maeola Harman, MD;  Location: [facility] INVASIVE CV LAB;  Service: Cardiovascular;;  RT PT   PERIPHERAL VASCULAR INTERVENTION  03/18/2019   Procedure: PERIPHERAL VASCULAR INTERVENTION;  Surgeon: Maeola Harman, MD;  Location: [facility] INVASIVE CV LAB;  Service: Cardiovascular;;  RT SFA   PERIPHERAL VASCULAR INTERVENTION  05/02/2019   Procedure: PERIPHERAL VASCULAR INTERVENTION;  Surgeon: Cephus Shelling, MD;  [facility]VAS[facility];  Service: Cardiovascular;;  Lt. SFA   RIGHT HEART CATH N/A 06/07/2017   Procedure: RIGHT HEART CATH;  Surgeon: Laurey Morale, MD;  Location: [facility] INVASIVE CV LAB;  Service: Cardiovascular;  Laterality: N/A;   RIGHT HEART CATH N/A 02/08/2018   Procedure: RIGHT HEART CATH;  Surgeon: Laurey Morale, MD;  Location: [facility] INVASIVE CV LAB;  Service: Cardiovascular;  Laterality: N/A;   RIGHT HEART CATH N/A 08/09/2018   Procedure: RIGHT HEART CATH;  Surgeon: Laurey Morale, MD;  Location: [facility] INVASIVE CV LAB;  Service: Cardiovascular;  Laterality: N/A;   RIGHT/LEFT HEART CATH AND CORONARY ANGIOGRAPHY N/A 11/23/2016   Procedure: Right/Left Heart Cath and Coronary Angiography;  Surgeon: Laurey Morale, MD;  Location: [facility] INVASIVE CV LAB;  Service: Cardiovascular;  Laterality: N/A;   TEE WITHOUT CARDIOVERSION N/A 06/12/2017   Procedure: TRANSESOPHAGEAL ECHOCARDIOGRAM (TEE);  Surgeon: Donata Clay, Theron Arista, MD;  Location: [facility] OR;  Service: Open Heart Surgery;  Laterality: N/A;   TEE WITHOUT CARDIOVERSION N/A 02/28/2018   Procedure: TRANSESOPHAGEAL ECHOCARDIOGRAM (TEE);  Surgeon: Donata Clay, Theron Arista, MD;  Location: [facility] OR;  Service: Open Heart Surgery;  Laterality: N/A;   TEMPORARY DIALYSIS CATHETER Left 05/24/2018   Procedure: TEMPORARY DIALYSIS CATHETER;  Surgeon: Cephus Shelling, MD;  [facility] INVASIVE CV  LAB;  Service: Cardiovascular;  Laterality: Left;   THROMBECTOMY AND REVISION OF ARTERIOVENTOUS (AV) GORETEX  GRAFT Right 12/12/2017   Procedure: THROMBECTOMY ARTERIOVENTOUS (AV) GORETEX  GRAFT RIGHT UPPER ARM;  Surgeon: Fransisco Hertz, MD;  Location: [facility] OR;  Service: Vascular;  Laterality: Right;   TRANSMETATARSAL AMPUTATION Right 04/30/2019   Procedure: RIGHT TRANSMETATARSAL AMPUTATION;  Surgeon: Maeola Harman, MD;  Location: [facility] OR;  Service: Vascular;  Laterality: Right;     A IV Location/Drains/Wounds Patient Lines/Drains/Airways Status     Active Line/Drains/Airways     Name Placement date Placement time Site Days   Peripheral IV 02/03/23 20 G Right Antecubital 02/03/23  0853  Antecubital  less than 1   Fistula / Graft Right Upper arm Arteriovenous vein graft 10/31/17  --  Upper arm  1921   Fistula / Graft Left Upper arm Arteriovenous vein graft 08/09/18  0856  Upper arm  1639  Hemodialysis Catheter Left Internal jugular Double-lumen 05/24/18  1045  Internal jugular  1716   Pressure Injury 01/29/23 Buttocks Left Stage 2 -  Partial thickness loss of dermis presenting as a shallow open injury with a red, pink wound bed without slough. 1 cm wound lower left buttocks - pt states it's been there for a week 01/29/23  1650  -- 5            Intake/Output Last 24 hours No intake or output data in the 24 hours ending 02/03/23 1227  Labs/Imaging Results for orders placed or performed during the hospital encounter of 02/03/23 (from the past 48 hour(s))  Protime-INR     Status: Abnormal   Collection Time: 02/03/23  8:14 AM  Result Value Ref Range   Prothrombin Time 43.5 (H) 11.4 - 15.2 seconds   INR 4.6 (HH) 0.8 - 1.2    Comment: REPEATED TO VERIFY CRITICAL RESULT CALLED TO, READ BACK BY AND VERIFIED WITH: H. VAN, RN 0930 02/03/2023 BY MACEDA,J (NOTE) INR goal varies based on device and disease states. Performed at [facility] Lab, [address] [place],  [place] [address]   Comprehensive metabolic panel     Status: Abnormal   Collection Time: 02/03/23  8:25 AM  Result Value Ref Range   Sodium 132 (L) 135 - 145 mmol/L   Potassium 2.7 (LL) 3.5 - 5.1 mmol/L    Comment: CRITICAL RESULT CALLED TO, READ BACK BY AND VERIFIED WITH VAN KRETSCHMAR, J. @ 0924 02/03/23 BUTLER J.   Chloride 93 (L) 98 - 111 mmol/L   CO2 27 22 - 32 mmol/L   Glucose, Bld 63 (L) 70 - 99 mg/dL    Comment: Glucose reference range applies only to samples taken after fasting for at least 8 hours.   BUN 20 6 - 20 mg/dL   Creatinine, Ser 6.04 (H) 0.61 - 1.24 mg/dL   Calcium 7.6 (L) 8.9 - 10.3 mg/dL   Total Protein 5.9 (L) 6.5 - 8.1 g/dL   Albumin 2.7 (L) 3.5 - 5.0 g/dL   AST 36 15 - 41 U/L   ALT 32 0 - 44 U/L   Alkaline Phosphatase 78 38 - 126 U/L   Total Bilirubin 0.8 0.3 - 1.2 mg/dL   GFR, Estimated 10 (L) >60 mL/min    Comment: (NOTE) Calculated using the CKD-EPI Creatinine Equation (2021)    Anion gap 12 5 - 15    Comment: Performed at [facility], [address], [place], [place] [address]  CBC with Differential     Status: Abnormal   Collection Time: 02/03/23  8:25 AM  Result Value Ref Range   WBC 7.9 4.0 - 10.5 K/uL   RBC 3.06 (L) 4.22 - 5.81 MIL/uL   Hemoglobin 8.8 (L) 13.0 - 17.0 g/dL   HCT 11.9 (L) 14.7 - 82.9 %   MCV 87.9 80.0 - 100.0 fL   MCH 28.8 26.0 - 34.0 pg   MCHC 32.7 30.0 - 36.0 g/dL   RDW 56.2 (H) 13.0 - 86.5 %   Platelets 127 (L) 150 - 400 K/uL    Comment: REPEATED TO VERIFY   nRBC 0.0 0.0 - 0.2 %   Neutrophils Relative % 78 %   Neutro Abs 6.1 1.7 - 7.7 K/uL   Lymphocytes Relative 9 %   Lymphs Abs 0.7 0.7 - 4.0 K/uL   Monocytes Relative 7 %   Monocytes Absolute 0.6 0.1 - 1.0 K/uL   Eosinophils Relative 5 %  Eosinophils Absolute 0.4 0.0 - 0.5 K/uL   Basophils Relative 0 %   Basophils Absolute 0.0 0.0 - 0.1 K/uL   Immature Granulocytes 1 %   Abs Immature Granulocytes 0.07 0.00 - 0.07 K/uL    Comment: Performed at [facility]  Lab, [address], [place], [place] [address]  Troponin I (High Sensitivity)     Status: Abnormal   Collection Time: 02/03/23  8:25 AM  Result Value Ref Range   Troponin I (High Sensitivity) 126 (HH) <18 ng/L    Comment: CRITICAL RESULT CALLED TO, READ BACK BY AND VERIFIED WITH VAN KRETSCHMAR, J. @ [phone] 02/03/23 BUTLER J. (NOTE) Elevated high sensitivity troponin I (hsTnI) values and significant  changes across serial measurements may suggest ACS but many other  chronic and acute conditions are known to elevate hsTnI results.  Refer to the "Links" section for chest pain algorithms and additional  guidance. Performed at [facility], [address] [place], [place] [address]   Brain natriuretic peptide     Status: Abnormal   Collection Time: 02/03/23  8:25 AM  Result Value Ref Range   B Natriuretic Peptide 1,200.6 (H) 0.0 - 100.0 pg/mL    Comment: Performed at [facility] Lab, [address] [place], [place] [address]  Lactic acid, plasma     Status: None   Collection Time: 02/03/23  8:25 AM  Result Value Ref Range   Lactic Acid, Venous 0.9 0.5 - 1.9 mmol/L    Comment: Performed at [facility] Lab, [address], [place], [place] [address]  Magnesium     Status: None   Collection Time: 02/03/23  8:25 AM  Result Value Ref Range   Magnesium 1.7 1.7 - 2.4 mg/dL    Comment: Performed at [facility] Lab, [address], [place], [place] [address]  I-Stat venous blood gas, ED     Status: Abnormal   Collection Time: 02/03/23  9:00 AM  Result Value Ref Range   pH, Ven 7.429 7.25 - 7.43   pCO2, Ven 47.6 44 - 60 mmHg   pO2, Ven 49 (H) 32 - 45 mmHg   Bicarbonate 31.5 (H) 20.0 - 28.0 mmol/L   TCO2 33 (H) 22 - 32 mmol/L   O2 Saturation 85 %   Acid-Base Excess 6.0 (H) 0.0 - 2.0 mmol/L   Sodium 134 (L) 135 - 145 mmol/L   Potassium 2.8 (L) 3.5 - 5.1 mmol/L   Calcium, Ion 0.99 (L) 1.15 - 1.40 mmol/L   HCT 30.0 (L) 39.0 - 52.0 %   Hemoglobin 10.2 (L) 13.0 - 17.0 g/dL   Sample  type VENOUS   Troponin I (High Sensitivity)     Status: Abnormal   Collection Time: 02/03/23 10:09 AM  Result Value Ref Range   Troponin I (High Sensitivity) 136 (HH) <18 ng/L    Comment: CRITICAL VALUE NOTED. VALUE IS CONSISTENT WITH PREVIOUSLY REPORTED/CALLED VALUE (NOTE) Elevated high sensitivity troponin I (hsTnI) values and significant  changes across serial measurements may suggest ACS but many other  chronic and acute conditions are known to elevate hsTnI results.  Refer to the "Links" section for chest pain algorithms and additional  guidance. Performed at [facility] Lab, [address], [place], [place] [address]   CBG monitoring, ED     Status: Abnormal   Collection Time: 02/03/23 11:24 AM  Result Value Ref Range   Glucose-Capillary 68 (L) 70 - 99 mg/dL    Comment:  Glucose reference range applies only to samples taken after fasting for at least 8 hours.   *Note: Due to a large number of results and/or encounters for the requested time period, some results have not been displayed. A complete set of results can be found in Results Review.   ECHOCARDIOGRAM LIMITED  Result Date: 02/03/2023    ECHOCARDIOGRAM LIMITED REPORT   Patient Name:   [name] Date of Exam: 02/03/2023 Medical Rec #:  [id number]          Height:       68.0 in Accession #:    [id number]         Weight:       181.0 lb Date of Birth:  Jul 24, 1966          BSA:          1.959 m Patient Age:    57 years           BP:           0/0 mmHg Patient Gender: M                  HR:           68 bpm. Exam Location:  Inpatient Procedure: Limited Echo, Color Doppler and Cardiac Doppler Indications:    LVAD Evaluation  History:        Patient has prior history of Echocardiogram examinations, most                 recent 01/24/2023. CHF, CAD; Risk Factors:Hypertension, Diabetes                 and Dyslipidemia.  Sonographer:    Irving Burton Senior RDCS Referring Phys: [phone] Roxy Horseman SIMMONS  Sonographer Comments: Heart Mate III,  ramp performed 10 days prior IMPRESSIONS  1. Left ventricular ejection fraction, by estimation, is <20%. The left ventricle has severely decreased function. The left ventricle demonstrates global hypokinesis. There is the interventricular septum is flattened in systole and diastole, consistent with right ventricular pressure and volume overload.  2. Right ventricular systolic function is severely reduced. The right ventricular size is moderately enlarged. There is normal pulmonary artery systolic pressure.  3. No evidence of mitral valve regurgitation.  4. Aortic valve regurgitation is moderate.  5. The inferior vena cava is dilated in size with <50% respiratory variability, suggesting right atrial pressure of 15 mmHg. FINDINGS  Left Ventricle: HM III LVAD present. Flow 2.7, PI 10.2, Power 4.0, Speed 5300 rpm. Left ventricular ejection fraction, by estimation, is <20%. The left ventricle has severely decreased function. The left ventricle demonstrates global hypokinesis. The interventricular septum is flattened in systole and diastole, consistent with right ventricular pressure and volume overload. Right Ventricle: RVOT VTI 8.6cm. The right ventricular size is moderately enlarged. Right ventricular systolic function is severely reduced. There is normal pulmonary artery systolic pressure. The tricuspid regurgitant velocity is 1.95 m/s, and with an assumed right atrial pressure of 15 mmHg, the estimated right ventricular systolic pressure is 30.2 mmHg. Tricuspid Valve: Tricuspid valve regurgitation is mild. Aortic Valve: Aortic Valve opens 0/5. Aortic valve regurgitation is moderate. Venous: The inferior vena cava is dilated in size with less than 50% respiratory variability, suggesting right atrial pressure of 15 mmHg. Additional Comments: A device lead is visualized. Spectral Doppler performed. Color Doppler performed.  LEFT VENTRICLE PLAX 2D LVIDs:         4.70 cm  RIGHT VENTRICLE  IVC RV S prime:      3.08 cm/s  IVC diam: 2.30 cm TAPSE (M-mode): 0.7 cm TRICUSPID VALVE TR Peak grad:   15.2 mmHg TR Vmax:        195.00 cm/s Chilton Si MD Electronically signed by Chilton Si MD Signature Date/Time: 02/03/2023/11:01:47 AM    Final    CT CHEST ABDOMEN PELVIS WO CONTRAST  Result Date: 02/03/2023 CLINICAL DATA:  Respiratory illness, nondiagnostic x-ray. EXAM: CT CHEST, ABDOMEN AND PELVIS WITHOUT CONTRAST TECHNIQUE: Multidetector CT imaging of the chest, abdomen and pelvis was performed following the standard protocol without IV contrast. RADIATION DOSE REDUCTION: This exam was performed according to the departmental dose-optimization program which includes automated exposure control, adjustment of the mA and/or kV according to patient size and/or use of iterative reconstruction technique. COMPARISON:  One view chest x-ray 02/03/2023. CT chest, abdomen and pelvis without contrast 11/01/2022. CT chest with contrast 02/28/2018. FINDINGS: CT CHEST FINDINGS Cardiovascular: The heart is enlarged. A left ventricular assist device is again noted. Dense coronary artery calcifications are present. Extensive atherosclerotic calcifications are also present at the aortic arch great vessel origins. No aneurysm is present. Pulmonary arteries are unremarkable. Left chest ICD is again noted. Mediastinum/Nodes: Small nodes are stable. The esophagus is mildly distended proximally without any mass lesion visualized. No significant interval change is present. Lungs/Pleura: A 5 mm nodule in the left upper lobe is stable since the February exam. It has increased proximally 1 cm in average axial dimension since 2019. The superior segment right lower lobe nodule is obscured by airspace disease. No other focal nodule is present. Moderate dependent airspace disease is present bilaterally. No significant pleural effusions are present. The airways are patent. Musculoskeletal: Median sternotomy again noted. No chest wall mass or  suspicious bone lesion is present. CT ABDOMEN PELVIS FINDINGS Hepatobiliary: No focal liver abnormality is seen. No gallstones, gallbladder wall thickening, or biliary dilatation. Pancreas: Scattered calcifications are again noted. No discrete mass lesion is present. Spleen: Normal in size without focal abnormality. Adrenals/Urinary Tract: Adrenal glands are normal bilaterally. Scattered parenchymal calcifications are stable. Some of these calcifications are vascular. The ureters are within normal limits. Diffuse bladder wall thickening is increased since the prior exam. Some inflammatory changes are present about the urinary bladder. Stomach/Bowel: The stomach and duodenum within normal limits. Small bowel is unremarkable. Partial right hemicolectomy noted. The anastomosis is intact. Transverse colon is unremarkable. The descending and sigmoid colon are within normal limits. Vascular/Lymphatic: Extensive atherosclerotic calcifications are again noted. No aneurysm is present. No significant adenopathy is present. Reproductive: Prostate is unremarkable. The seminal vesicles are calcified. Other: A small paraumbilical hernia contains fat without bowel. No other significant ventral hernias are present. The left ventricular assist device drive line is again noted. No free fluid or free air is present. Musculoskeletal: 2 vertebral body heights and alignment are normal. No acute or focal osseous abnormality is present. The hips are located and normal. IMPRESSION: 1. Diffuse bladder wall thickening with some inflammatory changes about the urinary bladder. This is concerning for cystitis. 2. Stable 5 mm nodule in the left upper lobe since the February exam. It has increased proximally 1 cm in average axial dimension since 2019. No follow-up needed if patient is low-risk (and has no known or suspected primary neoplasm). Non-contrast chest CT can be considered in 12 months if patient is high-risk. This recommendation follows  the consensus statement: Guidelines for Management of Incidental Pulmonary Nodules Detected on CT Images: From the Fleischner Society  2017; Radiology 2017; 284:228-243. 3. The superior segment nodule in the right lower lobe nodule is obscured by airspace disease. 4. Cardiomegaly with left ventricular assist device and left chest ICD. 5. Coronary artery disease. 6. Small paraumbilical hernia contains fat without bowel. 7.  Aortic Atherosclerosis (ICD10-I70.0). Electronically Signed   By: Marin Roberts M.D.   On: 02/03/2023 10:49   DG Chest Port 1 View  Result Date: 02/03/2023 CLINICAL DATA:  Cough. EXAM: PORTABLE CHEST 1 VIEW COMPARISON:  November 08, 2022. FINDINGS: Stable cardiomediastinal silhouette. Left ventricular assist device is unchanged. Left internal jugular dialysis catheter is unchanged. Left-sided defibrillator is unchanged. Mild bibasilar subsegmental atelectasis is noted. Bony thorax is unremarkable. IMPRESSION: Stable support apparatus including left ventricular assist device. Mild bibasilar subsegmental atelectasis. Electronically Signed   By: Lupita Raider M.D.   On: 02/03/2023 09:28   CT Head Wo Contrast  Result Date: 02/03/2023 CLINICAL DATA:  Mental status change, unknown cause EXAM: CT HEAD WITHOUT CONTRAST TECHNIQUE: Contiguous axial images were obtained from the base of the skull through the vertex without intravenous contrast. RADIATION DOSE REDUCTION: This exam was performed according to the departmental dose-optimization program which includes automated exposure control, adjustment of the mA and/or kV according to patient size and/or use of iterative reconstruction technique. COMPARISON:  CT head July 20, 2021. FINDINGS: Brain: No evidence of acute infarction, hemorrhage, hydrocephalus, extra-axial collection or mass lesion/mass effect. Remote left cerebellar infarcts. Patchy white matter hypodensities, nonspecific but compatible with chronic microvascular ischemic change.  Vascular: No hyperdense vessel identified. Skull: No acute fracture. Sinuses/Orbits: Largely clear sinuses.  No acute orbital findings. Other: No mastoid effusions. IMPRESSION: No evidence of acute intracranial abnormality. Electronically Signed   By: Feliberto Harts M.D.   On: 02/03/2023 09:16    Pending Labs Unresulted Labs (From admission, onward)     Start     Ordered   02/04/23 0500  Lactate dehydrogenase  Daily,   R      02/03/23 1107   02/04/23 0500  Protime-INR  Daily,   R      02/03/23 1107   02/04/23 0500  CBC  Daily,   R      02/03/23 1107   02/04/23 0500  Basic metabolic panel  Daily,   R      02/03/23 1107   02/03/23 1104  Protime-INR  ONCE - STAT,   STAT       Comments: Blood test to be collected in minimum volume tubes    02/03/23 1107   02/03/23 1104  Lactate dehydrogenase  ONCE - STAT,   STAT       Comments: Blood test to be collected in minimum volume tubes    02/03/23 1107   02/03/23 0802  Culture, blood (routine x 2)  BLOOD CULTURE X 2,   R (with STAT occurrences)      02/03/23 0802   02/03/23 0802  Lactic acid, plasma  Now then every 2 hours,   R (with STAT occurrences)      02/03/23 0802            Vitals/Pain Today's Vitals   02/03/23 1130 02/03/23 1200 02/03/23 1215 02/03/23 1218  BP: (!) 142/103  (!) 140/100   Pulse:      Resp: 16 15 13    Temp:      TempSrc:      SpO2:      PainSc:    0-No pain    Isolation Precautions No active isolations  Medications Medications  acetaminophen (TYLENOL) tablet 650 mg (has no administration in time range)  amiodarone (PACERONE) tablet 400 mg (400 mg Oral Given 02/03/23 1214)  aspirin EC tablet 81 mg (81 mg Oral Given 02/03/23 1214)  atorvastatin (LIPITOR) tablet 40 mg (has no administration in time range)  citalopram (CELEXA) tablet 20 mg (20 mg Oral Given 02/03/23 1214)  levocetirizine (XYZAL) tablet 5 mg (has no administration in time range)  midodrine (PROAMATINE) tablet 10 mg (has no administration  in time range)  pantoprazole (PROTONIX) EC tablet 40 mg (has no administration in time range)  quiNIDine sulfate tablet 200 mg (200 mg Oral Given 02/03/23 1213)  sevelamer carbonate (RENVELA) packet 0.8 g (has no administration in time range)  sodium chloride 0.9 % bolus 500 mL (0 mLs Intravenous Stopped 02/03/23 0945)  potassium chloride (KLOR-CON) packet 40 mEq (40 mEq Oral Given 02/03/23 1005)  magnesium sulfate IVPB 1 g 100 mL (0 g Intravenous Stopped 02/03/23 1128)  lactated ringers bolus 250 mL (0 mLs Intravenous Stopped 02/03/23 1140)  dextrose 50 % solution 25 mL (25 mLs Intravenous Given 02/03/23 1131)    Mobility non-ambulatory     Focused Assessments    R Recommendations: See Admitting Provider Note  Report given to:   Additional Notes:

## 2023-02-03 NOTE — Progress Notes (Addendum)
LVAD Coordinator ED Encounter  [name] a 57 y.o. male that presented to [facility] ER via [facility] today due to hypotension and lethargy during dialysis this morning. Reported LOW FLOW alarm at dialysis. He has a past medical history  has a past medical history of Angina, ASCVD (arteriosclerotic cardiovascular disease), Automatic implantable cardiac defibrillator -St. Jude's, Benign neoplasm of colon, CHF (congestive heart failure) (HCC), Chronic systolic heart failure (HCC), Coronary artery disease, Crohn's disease (HCC), Deep venous thrombosis (HCC), Dialysis patient (HCC), Dyspnea, Gastroparesis, GERD (gastroesophageal reflux disease), High cholesterol, Hyperlipidemia, Hypersomnolent, Hypertension, essential, Ischemic cardiomyopathy, Type II or unspecified type diabetes mellitus without mention of complication, not stated as uncontrolled, and Unspecified gastritis and gastroduodenitis without mention of hemorrhage.  LVAD is a HM3 and was implanted on 03/01/18 by Dr. Maren Beach for destination therapy.   LOW FLOW alarms noted. Pt received 500cc bolus without resolution of alarming. Additional 250cc bolus ordered as well as STAT Echo.  St. Jude ICD interrogated at bedside. No events noted. Pt recently discharged 5/27 for refractory VT.  VAD Coordinator accompanied pt to CT.  K+ 2.8. Orders received for replacement with of Potassium along with 1G of Magnesium.  Vital signs: HR: 70 Doppler MAP: 96 Automated BP:102/84 (91) O2 Sat: UTO  LVAD interrogation reveals:  Speed: 5350 Flow: 3.1 Power:  4.1 PI: 3.4  Alarms: LOW FLOW  Drive Line: Existing VAD dressing removed and site care performed using sterile technique. Drive line exit site cleaned with Chlora prep applicators x 2, allowed to dry, No silver strip applied. Exit site healed and incorporated, the velour is fully implanted at exit site. No redness, tenderness, drainage, foul odor or rash noted. Drive line anchor re-applied. Pt  denies fever or chills. Dressing change due 02/10/23 by bedside nurse.   Significant Events with LVAD: Updated VAD Providers Dr. Gasper Lloyd and Boyce Medici PA about the above. No LVAD issues and pump is functioning as expected. Able to independently manage LVAD equipment. No LVAD needs at this time.    Simmie Davies RN, BSN VAD Coordinator 24/7 Pager [phone]

## 2023-02-03 NOTE — Progress Notes (Addendum)
F/u check on pt. Remains drowsy but easy to wake and answers questions appropriately. Less altered. Now oriented X 3 to person place and time. Notified Nephrology team (Dr. Arlean Hopping) that pt has been admitted.   CBG improved post D50, 63>>89  Head CT negative  Lactic acid 0.9 x 2  Bcx pending. Remains afebrile   F/u BMP to check K repletion ordered  Given severe RV dysfunction noted on echo, will check NH3 level   No further low flow alarms  Limit use of opioids. PRN Tylenol ordered for pain.  [name], PA-C

## 2023-02-03 NOTE — ED Triage Notes (Signed)
Pt bib ems from dialysis; 20 min in to treatment, took less than 200 mls off; alarm went off on lvad; stopped treatment; called 911 and lvad coordinator; could not specify what alarm indicated; staff endorses more lethargic than normal; normally able to carry on conversation; alert to verbal stimuli; no alarm at present; more lethargic than normal; sinus rhythm on monitor; answers questions appropriately BP initially 88/64, last bp 128/70; icu last week with vtach;endorses cough x 6 months; denies fevers; 3 L at baseline, 100%; cbg 95; HR 76

## 2023-02-03 NOTE — ED Notes (Signed)
LVAD coordinator at bedside.

## 2023-02-04 DIAGNOSIS — I953 Hypotension of hemodialysis: Secondary | ICD-10-CM | POA: Diagnosis not present

## 2023-02-04 LAB — GLUCOSE, CAPILLARY
Glucose-Capillary: 126 mg/dL — ABNORMAL HIGH (ref 70–99)
Glucose-Capillary: 31 mg/dL — CL (ref 70–99)
Glucose-Capillary: 86 mg/dL (ref 70–99)
Glucose-Capillary: 81 mg/dL (ref 70–99)
Glucose-Capillary: 72 mg/dL (ref 70–99)
Glucose-Capillary: 75 mg/dL (ref 70–99)
Glucose-Capillary: 137 mg/dL — ABNORMAL HIGH (ref 70–99)

## 2023-02-04 LAB — CBC
HCT: 29.7 % — ABNORMAL LOW (ref 39.0–52.0)
Hemoglobin: 9.6 g/dL — ABNORMAL LOW (ref 13.0–17.0)
MCH: 28.3 pg (ref 26.0–34.0)
MCHC: 32.3 g/dL (ref 30.0–36.0)
MCV: 87.6 fL (ref 80.0–100.0)
Platelets: 122 10*3/uL — ABNORMAL LOW (ref 150–400)
RBC: 3.39 MIL/uL — ABNORMAL LOW (ref 4.22–5.81)
RDW: 17.2 % — ABNORMAL HIGH (ref 11.5–15.5)
WBC: 7.8 10*3/uL (ref 4.0–10.5)
nRBC: 0 % (ref 0.0–0.2)

## 2023-02-04 LAB — LACTATE DEHYDROGENASE: LDH: 178 U/L (ref 98–192)

## 2023-02-04 LAB — BASIC METABOLIC PANEL
Anion gap: 14 (ref 5–15)
BUN: 24 mg/dL — ABNORMAL HIGH (ref 6–20)
CO2: 25 mmol/L (ref 22–32)
Calcium: 7.8 mg/dL — ABNORMAL LOW (ref 8.9–10.3)
Chloride: 91 mmol/L — ABNORMAL LOW (ref 98–111)
Creatinine, Ser: 7.26 mg/dL — ABNORMAL HIGH (ref 0.61–1.24)
GFR, Estimated: 8 mL/min — ABNORMAL LOW (ref >60)
Glucose, Bld: 44 mg/dL — CL (ref 70–99)
Potassium: 3.4 mmol/L — ABNORMAL LOW (ref 3.5–5.1)
Sodium: 130 mmol/L — ABNORMAL LOW (ref 135–145)

## 2023-02-04 LAB — PROTIME-INR
INR: 2.9 — ABNORMAL HIGH (ref 0.8–1.2)
Prothrombin Time: 30.6 seconds — ABNORMAL HIGH (ref 11.4–15.2)

## 2023-02-04 LAB — CULTURE, BLOOD (ROUTINE X 2)

## 2023-02-04 MED ORDER — HEPARIN SODIUM (PORCINE) 1000 UNIT/ML IJ SOLN
2000.0000 [IU] | Freq: Once | INTRAMUSCULAR | Status: AC
  Administered 2023-02-06: 2000 [IU] via INTRAVENOUS
  Filled 2023-02-04: qty 2

## 2023-02-04 MED ORDER — PROMETHAZINE HCL 25 MG PO TABS
12.5000 mg | ORAL_TABLET | Freq: Three times a day (TID) | ORAL | Status: DC | PRN
  Administered 2023-02-04 – 2023-02-05 (×3): 12.5 mg via ORAL
  Filled 2023-02-04 (×4): qty 1

## 2023-02-04 MED ORDER — HEPARIN SODIUM (PORCINE) 1000 UNIT/ML DIALYSIS
2000.0000 [IU] | INTRAMUSCULAR | Status: DC | PRN
  Filled 2023-02-04: qty 2

## 2023-02-04 MED ORDER — WARFARIN - PHARMACIST DOSING INPATIENT: Freq: Every day | Status: DC

## 2023-02-04 MED ORDER — WARFARIN SODIUM 2.5 MG PO TABS
2.5000 mg | ORAL_TABLET | Freq: Once | ORAL | Status: AC
  Administered 2023-02-04: 2.5 mg via ORAL
  Filled 2023-02-04: qty 1

## 2023-02-04 MED ORDER — DEXTROSE 50 % IV SOLN
25.0000 g | INTRAVENOUS | Status: AC
  Administered 2023-02-04: 25 g via INTRAVENOUS

## 2023-02-04 MED ORDER — HEPARIN SODIUM (PORCINE) 1000 UNIT/ML DIALYSIS
5000.0000 [IU] | Freq: Once | INTRAMUSCULAR | Status: AC
  Administered 2023-02-04: 5000 [IU] via INTRAVENOUS_CENTRAL
  Filled 2023-02-04: qty 5

## 2023-02-04 MED ORDER — ONDANSETRON HCL 4 MG/2ML IJ SOLN
4.0000 mg | INTRAMUSCULAR | Status: DC | PRN
  Administered 2023-02-04 – 2023-02-06 (×4): 4 mg via INTRAVENOUS
  Filled 2023-02-04 (×4): qty 2

## 2023-02-04 MED ORDER — WARFARIN SODIUM 3 MG PO TABS: 3.0000 mg | ORAL_TABLET | Freq: Once | ORAL | Status: DC

## 2023-02-04 MED ORDER — GLUCOSE 40 % PO GEL
2.0000 | ORAL | Status: DC
  Filled 2023-02-04: qty 2.42

## 2023-02-04 MED ORDER — HEPARIN SODIUM (PORCINE) 1000 UNIT/ML IJ SOLN
5000.0000 [IU] | Freq: Once | INTRAMUSCULAR | Status: AC
  Administered 2023-02-06: 5000 [IU] via INTRAVENOUS
  Filled 2023-02-04 (×2): qty 5

## 2023-02-04 NOTE — Progress Notes (Addendum)
ANTICOAGULATION CONSULT NOTE - Follow Up Consult  Pharmacy Consult for warfarin Indication: LVAD  Allergies  Allergen Reactions   Metformin And Related Diarrhea    Patient Measurements: Weight: 84.4 kg (186 lb 1.1 oz)  Vital Signs: Temp: 97.9 F (36.6 C) (06/01 0720) Temp Source: Oral (06/01 0720) BP: 128/93 (06/01 0720) Pulse Rate: 71 (06/01 0720)  Labs: Recent Labs    02/03/23 0814 02/03/23 0825 02/03/23 0825 02/03/23 0900 02/03/23 1009 02/03/23 1104 02/03/23 1633 02/04/23 0013  HGB  --  8.8*   < > 10.2*  --   --   --  9.6*  HCT  --  26.9*  --  30.0*  --   --   --  29.7*  PLT  --  127*  --   --   --   --   --  122*  LABPROT 43.5*  --   --   --   --  34.3*  --  30.6*  INR 4.6*  --   --   --   --  3.4*  --  2.9*  CREATININE  --  6.13*  --   --   --   --  6.59* 7.26*  TROPONINIHS  --  126*  --   --  136*  --   --   --    < > = values in this interval not displayed.     Estimated Creatinine Clearance: 11.7 mL/min (A) (by C-G formula based on SCr of 7.26 mg/dL (H)).   Medical History: Past Medical History:  Diagnosis Date   Angina    ASCVD (arteriosclerotic cardiovascular disease)    , Anterior infarction 2005, LAD diagonal bifurcation intervention 03/2004   Automatic implantable cardiac defibrillator -St. Jude's        Benign neoplasm of colon    CHF (congestive heart failure) (HCC)    Chronic systolic heart failure (HCC)    Coronary artery disease     Widely patent previously placed stents in the left anterior    Crohn's disease (HCC)    Deep venous thrombosis (HCC)    Recurrent-on Coumadin   Dialysis patient (HCC)    Dyspnea    Gastroparesis    GERD (gastroesophageal reflux disease)    High cholesterol    Hyperlipidemia    Hypersomnolent    Previous diagnosis of narcolepsy   Hypertension, essential    Ischemic cardiomyopathy    Ejection fraction 15-20% catheterization 2010   Type II or unspecified type diabetes mellitus without mention of  complication, not stated as uncontrolled    Unspecified gastritis and gastroduodenitis without mention of hemorrhage      Assessment: 57 yo male on chronic Coumadin 5 mg daily for LVAD and hx DVT. Last admission, amiodarone was increased, quinidine added, and continues on cefadroxil for chronic suppression. Admit INR 3.4 at above goal, now 2.9 after holding one dose.   Hgb 9.6, PLTc 122K. No bleeding noted.  Goal of Therapy:  INR 2-3 Monitor platelets by anticoagulation protocol: Yes   Plan:  Warfarin 2.5 mg today Daily CBC and INR  Thank you for involving pharmacy in this patient's care.  Enos Fling, PharmD PGY2 Pharmacy Resident 02/04/2023 7:44 AM

## 2023-02-04 NOTE — Progress Notes (Signed)
   02/04/23 1225  Vitals  Temp 98 F (36.7 C)  Temp Source Oral  BP 101/85  MAP (mmHg) 91  BP Location Right Arm  BP Method Automatic  Patient Position (if appropriate) Lying  Pulse Rate 83  Pulse Rate Source Monitor  ECG Heart Rate 73  Resp 17  Oxygen Therapy  SpO2 100 %  O2 Device Nasal Cannula  O2 Flow Rate (L/min) 3 L/min  Patient Activity (if Appropriate) In bed  Pulse Oximetry Type Continuous  During Treatment Monitoring  HD Safety Checks Performed Yes  Intra-Hemodialysis Comments Tx completed;Tolerated well  Post Treatment  Dialyzer Clearance Lightly streaked  Duration of HD Treatment -hour(s) 3 hour(s)  Hemodialysis Intake (mL) 0 mL  Liters Processed 63  Fluid Removed (mL) 2500 mL  Tolerated HD Treatment Yes  Post-Hemodialysis Comments Tx completed. Tolerated well.   Received patient in bed to unit.  Alert and oriented.  Informed consent signed and in chart.   TX duration:3 hours  Patient tolerated well.  Transported back to the room  Alert, without acute distress.  Hand-off given to patient's nurse.   Access used: L HD Catheter Access issues: none  Total UF removed: 2500 Medication(s) given: heparin Post HD VS: see above Post HD weight: 78kg   [facility]

## 2023-02-04 NOTE — Progress Notes (Signed)
[facility] Progress Note  Subjective: seen in HD today, no c/o's.   Vitals:   02/04/23 0847 02/04/23 0917 02/04/23 0930 02/04/23 1000  BP: 120/64 (!) 120/92 133/87 124/84  Pulse: 69 61 67 63  Resp: 16 12 15 13   Temp: 97.6 F (36.4 C)     TempSrc: Oral     SpO2: 100% 100% 100% 100%  Weight: 80.6 kg       Exam: Gen: awakens easily to conversation, no distress, on HD CVS: Pulse regular rhythm with normal rate, LVAD hum audible over the precordium Resp: Clear to auscultation bilaterally, no distinct rales or rhonchi Abd: Soft, obese, nontender, bowel sounds normal Ext: Status post bilateral AKA, no LE edema, 1+ UE edema      Home meds - colace, hydralazine 25mg  tid on non-hd days, insulin glargine, ativan, reglan 10 tid, renavite, percocet, coumadin 5 qd, cefadroxil 1 gm po tiw after mwf hd, quinidine 200 bid, amiodarone, aspirin, lipitor, celexa, midodrine 10mg  pre HD mwf, protonix, renvela 800 mg ac tid, prns/ vits/ supps      OP HD: MWF [facility]  4h  400/1.5  77kg  2/2 bath  P4  Heparin 7000   [facility] - last HD today 5/31, had 45 min HD, BP's dropped taken off - low IDWG's - hectorol 5 mcg IV tiw - sensipar 180 mg po tiw - mircera 60 mcg IV q 2 wks, last dose on 5/22, due 6/05     Assessment/ Plan: Hypotension/ gen'd weakness - in LVAD patient on HD. Had short outpt HD Friday d/t low bp's. In ED w/u neg except echo showed worsening RHF. IV abx have been started in case of infection. IVF's were given in ED. BP's here have been low-normal.  Refractory VT - recent admit for persistent VT. Didn't respond to amiodarone + mexiletine so was transitioned to amio/ quinidine and then VT stabilized.  Severe ischemic cardiomyopathy/congestive heart failure with reduced ejection fraction - s/p HeartMate 3 LVAD and ongoing anticoagulation with Coumadin and blood pressure support with midodrine.  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 ESRD: usual HD MWF. Had 1 hr HD at OP unit yest am. Plan HD today x 3hrs.  Volume: is up by wts about 3kg, puffy UE's on exam. UF goal 2- 2.5 L   Anemia: Hb 10.5, next esa due on 6/05. Follow.  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 Cont IV vdra and po sensipar.  Chronic hypotension: cont midodrine 10mg  3x per wk pre HD     Rob Ardith Lewman MD [facility] 02/04/2023, 10:24 AM  Recent Labs  Lab 02/03/23 0825 02/03/23 0900 02/03/23 1633 02/04/23 0013  HGB 8.8* 10.2*  --  9.6*  ALBUMIN 2.7*  --   --   --   CALCIUM 7.6*  --  7.6* 7.8*  PHOS  --   --  4.2  --   CREATININE 6.13*  --  6.59* 7.26*  K 2.7* 2.8* 3.2* 3.4*   No results for input(s): "IRON", "TIBC", "FERRITIN" in the last 168 hours. Inpatient medications:  amiodarone  400 mg Oral Daily   aspirin EC  81 mg Oral Daily   atorvastatin  40 mg Oral q1800   cefadroxil  1,000 mg Oral Q M,W,F-1800   Chlorhexidine Gluconate Cloth  6 each Topical Q0600   [START ON 02/06/2023] cinacalcet  180 mg Oral Q M,W,F-HD   citalopram  20 mg Oral Daily   [START  ON 02/06/2023] doxercalciferol  5 mcg Intravenous Q M,W,F-HD   [START ON 02/05/2023] heparin  5,000 Units Dialysis Once in dialysis   loratadine  10 mg Oral Daily   [START ON 02/06/2023] midodrine  10 mg Oral Q M,W,F   pantoprazole  40 mg Oral BID AC   quiNIDine sulfate  200 mg Oral BID   sevelamer carbonate  0.8 g Oral TID WC   warfarin  2.5 mg Oral ONCE-1600   Warfarin - Pharmacist Dosing Inpatient   Does not apply q1600    acetaminophen, [START ON 02/05/2023] heparin, ondansetron (ZOFRAN) IV

## 2023-02-04 NOTE — H&P (Signed)
Advanced Heart Failure VAD History and Physical Note   PCP-Cardiologist: Dr. Shirlee Latch    Reason for Admission: Lethargy/AMS and Low Flow Alarm   HPI:    57 y.o. with history of CAD s/p anterior MI in 2010 and NSTEMI in 3/18 with DES to pRCA and mLAD and ischemic cardiomyopathy s/p Heartmate 3 LVAD in 2018 and ESRD on HD. He also had hx of ischemic lower extremities s/p bilateral AKAs.    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 Repeated trauma, refuses to wear anchor.   VT- 10/2021., 10/2022, 11/2022, 01/2023 VF 06/2022    On 01/21/23, patient had 12 episodes of VT, multiple ATP and 1 shock.  Saw Dr Shirlee Latch 01/24/23 and he reported he had only been taking mexiletine 2-3 times a week. Reported intolerance to mexiletine due to nausea. Dr. Shirlee Latch instructed to take mexiletine 150 mg twice a day.    On 05/24, he was in HD and received unsuccessful ICD shock for VT after he exhausted ATP therapies. Unable to pull volume at HD. Had 3 low flows. Sent to the ED. EP consulted and was able to ATP him out of VT with his device via more aggressive tachy scheme. He was admitted for further management. Placed on quinidine 200 mg twice a day and amio 200 mg twice a day and had no further VT. Was discharged on 5/27.  Pt now presents back to the ED via EMS from HD center given increase lethargy, hypotension and a low flow VAD alarm.  SBP dropped in the 80s. HD session terminated. Only able to complete ~20 min of treatment w/ less than 200 ml of volume removal. Given 400 cc bolus of IVF then transported to ED.   He remains lethargic but will awake and respond to questions. Altered, not oriented to place or time. ED BP post IVF bolus 97/84. Dopper 98. Tele shows NSR. ICD interrogation pending. Labs pending. Head CT and chest CT ordered. On 3L [place], O2 sats 99%. Daughter present at beside and reports that he has been lethargic since returning home. Has had nausea and vomiting and was taking  phenergan for nausea. 12 lead EKG pending.   He denies CP. Has not felt any ICD shocks.    LVAD INTERROGATION:  HeartMate III LVAD:  Flow 3.5 liters/min, speed 5350, power 3.9, PI 6.8.  1 low flow alarm  Interval hx: - Feels much better; lab work unremarkable - 14 PI events - Glucose continues to drop to 431   Objective:    Vital Signs:   Temp:  [97.6 F (36.4 C)-98 F (36.7 C)] 98 F (36.7 C) (06/01 1225) Pulse Rate:  [61-83] 83 (06/01 1225) Resp:  [11-20] 17 (06/01 1225) BP: (101-148)/(64-130) 131/101 (06/01 1300) SpO2:  [100 %] 100 % (06/01 1225) Weight:  [78 kg-84.4 kg] 78 kg (06/01 1225) Last BM Date : 01/26/23 Filed Weights   02/04/23 0600 02/04/23 0847 02/04/23 1225  Weight: 84.4 kg 80.6 kg 78 kg    Mean arterial Pressure 98  Physical Exam    General: chronically ill appearing, lethargic but easy to arouse and will respond to questions  HEENT: Normal Neck: supple. JVP 8 . Carotids 2+ bilat; no bruits. No lymphadenopathy or thyromegaly appreciated. Cor: Mechanical heart sounds with LVAD hum present. Lungs: Clear Abdomen: soft, nontender, nondistended. No hepatosplenomegaly. No bruits or masses. Good bowel sounds. Driveline: C/D/I; securement device intact and driveline incorporated Extremities: no cyanosis, clubbing, rash, edema + b/l AKA  Neuro:  lethargic but easy to arouse, altered. Oriented to person, not place or time. Affect flat   Telemetry   NSR   EKG   12 lead pending   Labs    Basic Metabolic Panel: Recent Labs  Lab 01/29/23 0110 01/30/23 0106 02/03/23 0825 02/03/23 0900 02/03/23 1633 02/04/23 0013  NA 132* 133* 132* 134* 129* 130*  K 4.4 4.6 2.7* 2.8* 3.2* 3.4*  CL 94* 95* 93*  --  91* 91*  CO2 27 24 27   --  27 25  GLUCOSE 155* 110* 63*  --  65* 44*  BUN 35* 52* 20  --  21* 24*  CREATININE 7.39* 9.23* 6.13*  --  6.59* 7.26*  CALCIUM 9.3 9.0 7.6*  --  7.6* 7.8*  MG 2.2 2.1 1.7  --   --   --   PHOS  --   --   --   --  4.2  --       Liver Function Tests: Recent Labs  Lab 02/03/23 0825  AST 36  ALT 32  ALKPHOS 78  BILITOT 0.8  PROT 5.9*  ALBUMIN 2.7*    No results for input(s): "LIPASE", "AMYLASE" in the last 168 hours. Recent Labs  Lab 02/03/23 1931  AMMONIA 46*    CBC: Recent Labs  Lab 01/29/23 0110 01/30/23 0106 02/03/23 0825 02/03/23 0900 02/04/23 0013  WBC 5.2 6.0 7.9  --  7.8  NEUTROABS  --   --  6.1  --   --   HGB 9.4* 9.3* 8.8* 10.2* 9.6*  HCT 29.3* 29.7* 26.9* 30.0* 29.7*  MCV 86.7 87.9 87.9  --  87.6  PLT 139* 137* 127*  --  122*     Cardiac Enzymes: No results for input(s): "CKTOTAL", "CKMB", "CKMBINDEX", "TROPONINI" in the last 168 hours.  BNP: BNP (last 3 results) Recent Labs    02/03/23 0825  BNP 1,200.6*    ProBNP (last 3 results) No results for input(s): "PROBNP" in the last 8760 hours.   CBG: Recent Labs  Lab 02/03/23 1231 02/04/23 0142 02/04/23 0216 02/04/23 0610 02/04/23 0758  GLUCAP 89 31* 126* 86 81     Coagulation Studies: Recent Labs    02/03/23 0814 02/03/23 1104 02/04/23 0013  LABPROT 43.5* 34.3* 30.6*  INR 4.6* 3.4* 2.9*    Other results: EKG: 12 lead EKG ordered/ pending   Imaging    No results found.    Patient Profile:   57 y.o. with history of CAD s/p anterior MI in 2010 and NSTEMI in 3/18 with DES to pRCA and mLAD and ischemic cardiomyopathy s/p Heartmate 3 LVAD in 2018 and ESRD on HD. He also had hx of ischemic lower extremities s/p bilateral AKAs. H/o repeated DL infections now on chronic cefadroxil for chronic suppression and recent hospitalization for refractory VT, brought into ED by EMS from HD for lethargy, hypotension, AMS and low flow alarm on VAD.   Assessment/Plan:    1. Lethargy/AMS  - Hypotensive at HD, SBPs 80s. HD terminated. Bolused w/ 400 cc IVFs - BP improved, 97/84. Dopper MAP 98. Tele shows NSR. Had 1 low flow alarm this morning, but VAD parameters currently stable  - interrogate ICD to check for  recurrent VT - CT scan with diffuse bladder wall thickening consistent with cystitis; otherwise work up unremarkable. Will continue abx through today. Transition to PO tomorrow. Symptoms possibly secondary to recurrent hypoglycemia also.   2. Recent VT - recent admit for refractory VT, failed multiple ATPs. EP  consulted. Device adjusted>>ATP in ED with conversion. Placed on oral amiodarone + quinidine w/ suppression of VT - back w/ above symptoms + low flow VAD alarm. Tele shows NSR - interrogate device - given recent n/v and frequent phenergan use, will obtain 12 lead EKG to check QTc and check BMP and Mg level  - continue PO amio and quinidine - No further VT; Ap at 70s on my exam.   3. HFrEF, s/p HMIII, ICM  St Jude ICD.  NSTEMI in March 2018 with DES to LAD and RCA, complicated by cardiogenic shock, low output requiring milrinone.  Echo 8/18 with EF 20-25%, moderate MR. CPX (8/18) with severe functional impairment due to HF.  He is s/p HM3 LVAD placement. He had pump thrombosis 6/19 due to [facility] outflow graft kinking.  He had pump exchange for another HM3.  LVAD parameters stable but he does have PI events and low flow events at times with HD. He takes midodrine pre-HD, but needs hydralazine on non-HD days.  - Had Ramp Echo 5/21. Speed decreased to 5300 - Volume managed per HD. Holding HD today given suspected low volume status  - Continue midodrine 10 mg tid.  - On warfarin. INR 2.9  4. Driveline infection, chronic  - Continue cefadroxil long term   - Blood cx negative 24h.    5. ESRD  - iHD M-W-F  - session terminated early today given hypotension and low flow alarms - if readmitted, will consult nephrology to manage HD     6. PAD - S/P bilateral AKA     I reviewed the LVAD parameters from today, and compared the results to the patient's prior recorded data.  No programming changes were made.  The LVAD is functioning within specified parameters.  The patient performs LVAD  self-test daily.  LVAD interrogation was negative for any significant power changes, alarms or PI events/speed drops.  LVAD equipment check completed and is in good working order.  Back-up equipment present.   LVAD education done on emergency procedures and precautions and reviewed exit site care.  Length of Stay: 1  Darielle Hancher, DO 02/04/2023, 2:51 PM  VAD Team Pager [phone] (7am - 7am) +++VAD ISSUES ONLY+++   Advanced Heart Failure Team Pager [phone] (M-F; 7a - 5p)  Please contact [facility] for night-coverage after hours (5p -7a ) and weekends on [email or web address] for all non- LVAD Issues

## 2023-02-04 NOTE — Progress Notes (Signed)
Hypoglycemic Event  CBG: 31  Treatment:8oz orange juice and 50% dextrose 25g.  Symptoms: None  Follow-up CBG: Time:0220 CBG Result:126  Possible Reasons for Event: Inadequate meal intake  Comments/MD notified: MD notified. No further orders.     [name] D Kamauri Kathol

## 2023-02-05 DIAGNOSIS — I953 Hypotension of hemodialysis: Secondary | ICD-10-CM | POA: Diagnosis not present

## 2023-02-05 LAB — GLUCOSE, CAPILLARY
Glucose-Capillary: 112 mg/dL — ABNORMAL HIGH (ref 70–99)
Glucose-Capillary: 104 mg/dL — ABNORMAL HIGH (ref 70–99)
Glucose-Capillary: 132 mg/dL — ABNORMAL HIGH (ref 70–99)
Glucose-Capillary: 128 mg/dL — ABNORMAL HIGH (ref 70–99)
Glucose-Capillary: 217 mg/dL — ABNORMAL HIGH (ref 70–99)

## 2023-02-05 LAB — BASIC METABOLIC PANEL
Anion gap: 12 (ref 5–15)
BUN: 16 mg/dL (ref 6–20)
CO2: 25 mmol/L (ref 22–32)
Calcium: 8.2 mg/dL — ABNORMAL LOW (ref 8.9–10.3)
Chloride: 94 mmol/L — ABNORMAL LOW (ref 98–111)
Creatinine, Ser: 5.9 mg/dL — ABNORMAL HIGH (ref 0.61–1.24)
GFR, Estimated: 10 mL/min — ABNORMAL LOW (ref >60)
Glucose, Bld: 116 mg/dL — ABNORMAL HIGH (ref 70–99)
Potassium: 3.6 mmol/L (ref 3.5–5.1)
Sodium: 131 mmol/L — ABNORMAL LOW (ref 135–145)

## 2023-02-05 LAB — PROTIME-INR
INR: 2.8 — ABNORMAL HIGH (ref 0.8–1.2)
Prothrombin Time: 29.7 seconds — ABNORMAL HIGH (ref 11.4–15.2)

## 2023-02-05 LAB — CBC
HCT: 29.4 % — ABNORMAL LOW (ref 39.0–52.0)
Hemoglobin: 9 g/dL — ABNORMAL LOW (ref 13.0–17.0)
MCH: 27.7 pg (ref 26.0–34.0)
MCHC: 30.6 g/dL (ref 30.0–36.0)
MCV: 90.5 fL (ref 80.0–100.0)
Platelets: 135 10*3/uL — ABNORMAL LOW (ref 150–400)
RBC: 3.25 MIL/uL — ABNORMAL LOW (ref 4.22–5.81)
RDW: 17.7 % — ABNORMAL HIGH (ref 11.5–15.5)
WBC: 6.9 10*3/uL (ref 4.0–10.5)
nRBC: 0 % (ref 0.0–0.2)

## 2023-02-05 LAB — CULTURE, BLOOD (ROUTINE X 2): Special Requests: ADEQUATE

## 2023-02-05 LAB — LACTATE DEHYDROGENASE: LDH: 144 U/L (ref 98–192)

## 2023-02-05 LAB — CORTISOL: Cortisol, Plasma: 15.2 ug/dL

## 2023-02-05 LAB — MAGNESIUM: Magnesium: 1.9 mg/dL (ref 1.7–2.4)

## 2023-02-05 MED ORDER — CHLORHEXIDINE GLUCONATE CLOTH 2 % EX PADS
6.0000 | MEDICATED_PAD | Freq: Every day | CUTANEOUS | Status: DC
  Administered 2023-02-06 – 2023-02-13 (×8): 6 via TOPICAL

## 2023-02-05 MED ORDER — WARFARIN SODIUM 1 MG PO TABS
1.0000 mg | ORAL_TABLET | Freq: Once | ORAL | Status: AC
  Administered 2023-02-05: 1 mg via ORAL
  Filled 2023-02-05: qty 1

## 2023-02-05 MED ORDER — SODIUM CHLORIDE 0.9 % IV SOLN
12.5000 mg | Freq: Three times a day (TID) | INTRAVENOUS | Status: DC | PRN
  Administered 2023-02-05 – 2023-02-09 (×11): 12.5 mg via INTRAVENOUS
  Filled 2023-02-05 (×3): qty 0.5
  Filled 2023-02-05 (×2): qty 12.5
  Filled 2023-02-05: qty 0.5
  Filled 2023-02-05: qty 12.5
  Filled 2023-02-05 (×3): qty 0.5
  Filled 2023-02-05: qty 12.5

## 2023-02-05 NOTE — Progress Notes (Signed)
[facility] Progress Note  Subjective: seen in room today, feeling better. BP's better. Got 2500 cc off w/ HD yest, down to 78kg post HD yest (dry = 77kg).   Vitals:   02/04/23 2200 02/05/23 0254 02/05/23 0400 02/05/23 0742  BP: 106/88 117/76 107/72 (!) 121/106  Pulse: 85 80 74   Resp: 20 15 15 15   Temp:    98 F (36.7 C)  TempSrc:    Oral  SpO2: 98%   100%  Weight:    77.8 kg    Exam: Gen: awake and alert, no distress, on HD CVS: Pulse regular rhythm with normal rate, LVAD hum audible over the precordium Resp: Clear to auscultation bilaterally, no distinct rales or rhonchi Abd: Soft, obese, nontender, bowel sounds normal Ext: sp bilat AKA, no LE edema, bilat UE edema better today      Home meds - colace, hydralazine 25mg  tid on non-hd days, insulin glargine, ativan, reglan 10 tid, renavite, percocet, coumadin 5 qd, cefadroxil 1 gm po tiw after mwf hd, quinidine 200 bid, amiodarone, aspirin, lipitor, celexa, midodrine 10mg  pre HD mwf, protonix, renvela 800 mg ac tid, prns/ vits/ supps      OP HD: MWF [facility]  4h  400/1.5  77kg  2/2 bath  P4  Heparin 7000   [facility] - last OP HD today 5/31, had 45 min HD, BP's dropped and pt was rinsed back - low IDWG's - hectorol 5 mcg IV tiw - sensipar 180 mg po tiw - mircera 60 mcg IV q 2 wks, last dose on 5/22, due 6/05     Assessment/ Plan: Hypotension/ gen'd weakness - in LVAD patient on HD. Had shortened outpt HD Friday d/t low bp's. In ED w/u neg except echo showed worsening RHF. IVF's were given in ED. BP's since have improved up to low 100s range. Pt feeling better.  Refractory VT - recent admit for persistent VT, rx'd with amiodarone + mexiletine initially w/o success then was transitioned to amio+ quinidine and VT stabilized.  Severe ischemic cardiomyopathy/congestive heart failure with reduced ejection fraction - s/p HeartMate 3 LVAD and ongoing anticoagulation with Coumadin and blood pressure support with  midodrine.  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 ESRD: usual HD MWF. Had 3h HD yest off schedule. Next HD Monday.  Volume:  close to dry wt, min edema UE's, 1kg over. UF 1-2 L next HD.  Anemia: Hb 10.5, next esa due on 6/05. Follow.  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 Cont IV vdra and po sensipar.  Chronic hypotension: cont midodrine 10mg  3x per wk pre HD     Rob Biruk Troia MD [facility] 02/05/2023, 10:48 AM  Recent Labs  Lab 02/03/23 0825 02/03/23 0900 02/03/23 1633 02/04/23 0013 02/05/23 0019  HGB 8.8*   < >  --  9.6* 9.0*  ALBUMIN 2.7*  --   --   --   --   CALCIUM 7.6*  --  7.6* 7.8* 8.2*  PHOS  --   --  4.2  --   --   CREATININE 6.13*  --  6.59* 7.26* 5.90*  K 2.7*   < > 3.2* 3.4* 3.6   < > = values in this interval not displayed.    No results for input(s): "IRON", "TIBC", "FERRITIN" in the last 168 hours. Inpatient medications:  amiodarone  400 mg Oral Daily   aspirin EC  81 mg Oral Daily   atorvastatin  40  mg Oral q1800   cefadroxil  1,000 mg Oral Q M,W,F-1800   Chlorhexidine Gluconate Cloth  6 each Topical Q0600   [START ON 02/06/2023] cinacalcet  180 mg Oral Q M,W,F-HD   citalopram  20 mg Oral Daily   [START ON 02/06/2023] doxercalciferol  5 mcg Intravenous Q M,W,F-HD   heparin sodium (porcine)  2,000 Units Intravenous Once   heparin sodium (porcine)  5,000 Units Intravenous Once   loratadine  10 mg Oral Daily   [START ON 02/06/2023] midodrine  10 mg Oral Q M,W,F   pantoprazole  40 mg Oral BID AC   quiNIDine sulfate  200 mg Oral BID   sevelamer carbonate  0.8 g Oral TID WC   warfarin  1 mg Oral ONCE-1600   Warfarin - Pharmacist Dosing Inpatient   Does not apply q1600    acetaminophen, ondansetron (ZOFRAN) IV, promethazine

## 2023-02-05 NOTE — Progress Notes (Signed)
ANTICOAGULATION CONSULT NOTE - Follow Up Consult  Pharmacy Consult for warfarin Indication: LVAD  Allergies  Allergen Reactions   Metformin And Related Diarrhea    Patient Measurements: Weight: 78 kg (171 lb 15.3 oz)  Vital Signs: Temp: 97.8 F (36.6 C) (06/01 1945) BP: 107/72 (06/02 0400) Pulse Rate: 74 (06/02 0400)  Labs: Recent Labs    02/03/23 0825 02/03/23 0900 02/03/23 1009 02/03/23 1104 02/03/23 1633 02/04/23 0013 02/05/23 0019 02/05/23 0051  HGB 8.8* 10.2*  --   --   --  9.6* 9.0*  --   HCT 26.9* 30.0*  --   --   --  29.7* 29.4*  --   PLT 127*  --   --   --   --  122* 135*  --   LABPROT  --   --   --  34.3*  --  30.6*  --  29.7*  INR  --   --   --  3.4*  --  2.9*  --  2.8*  CREATININE 6.13*  --   --   --  6.59* 7.26* 5.90*  --   TROPONINIHS 126*  --  136*  --   --   --   --   --      Estimated Creatinine Clearance: 13.2 mL/min (A) (by C-G formula based on SCr of 5.9 mg/dL (H)).   Medical History: Past Medical History:  Diagnosis Date   Angina    ASCVD (arteriosclerotic cardiovascular disease)    , Anterior infarction 2005, LAD diagonal bifurcation intervention 03/2004   Automatic implantable cardiac defibrillator -St. Jude's        Benign neoplasm of colon    CHF (congestive heart failure) (HCC)    Chronic systolic heart failure (HCC)    Coronary artery disease     Widely patent previously placed stents in the left anterior    Crohn's disease (HCC)    Deep venous thrombosis (HCC)    Recurrent-on Coumadin   Dialysis patient (HCC)    Dyspnea    Gastroparesis    GERD (gastroesophageal reflux disease)    High cholesterol    Hyperlipidemia    Hypersomnolent    Previous diagnosis of narcolepsy   Hypertension, essential    Ischemic cardiomyopathy    Ejection fraction 15-20% catheterization 2010   Type II or unspecified type diabetes mellitus without mention of complication, not stated as uncontrolled    Unspecified gastritis and gastroduodenitis  without mention of hemorrhage      Assessment: 57 yo male on chronic Coumadin 5 mg daily for LVAD and hx DVT. Last admission, amiodarone was increased, quinidine added, and continues on cefadroxil for chronic suppression. Admit INR 3.4 at above goal, now 2.8 after one held dose and one 2.5 mg dose.    Hgb down to 9, PLTc down to 144K. No bleeding noted. Given INR trend will further reduce dose today.  Goal of Therapy:  INR 2-3 Monitor platelets by anticoagulation protocol: Yes   Plan:  Warfarin 1 mg today Daily CBC and INR  Thank you for involving pharmacy in this patient's care.  Enos Fling, PharmD PGY2 Pharmacy Resident 02/05/2023 7:17 AM

## 2023-02-05 NOTE — H&P (Signed)
Advanced Heart Failure VAD History and Physical Note   PCP-Cardiologist: Dr. Shirlee Latch    Reason for Admission: Lethargy/AMS and Low Flow Alarm   HPI:    57 y.o. with history of CAD s/p anterior MI in 2010 and NSTEMI in 3/18 with DES to pRCA and mLAD and ischemic cardiomyopathy s/p Heartmate 3 LVAD in 2018 and ESRD on HD. He also had hx of ischemic lower extremities s/p bilateral AKAs.    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 Repeated trauma, refuses to wear anchor.   VT- 10/2021., 10/2022, 11/2022, 01/2023 VF 06/2022    On 01/21/23, patient had 12 episodes of VT, multiple ATP and 1 shock.  Saw Dr Shirlee Latch 01/24/23 and he reported he had only been taking mexiletine 2-3 times a week. Reported intolerance to mexiletine due to nausea. Dr. Shirlee Latch instructed to take mexiletine 150 mg twice a day.    On 05/24, he was in HD and received unsuccessful ICD shock for VT after he exhausted ATP therapies. Unable to pull volume at HD. Had 3 low flows. Sent to the ED. EP consulted and was able to ATP him out of VT with his device via more aggressive tachy scheme. He was admitted for further management. Placed on quinidine 200 mg twice a day and amio 200 mg twice a day and had no further VT. Was discharged on 5/27.  Pt now presents back to the ED via EMS from HD center given increase lethargy, hypotension and a low flow VAD alarm.  SBP dropped in the 80s. HD session terminated. Only able to complete ~20 min of treatment w/ less than 200 ml of volume removal. Given 400 cc bolus of IVF then transported to ED.   He remains lethargic but will awake and respond to questions. Altered, not oriented to place or time. ED BP post IVF bolus 97/84. Dopper 98. Tele shows NSR. ICD interrogation pending. Labs pending. Head CT and chest CT ordered. On 3L [place], O2 sats 99%. Daughter present at beside and reports that he has been lethargic since returning home. Has had nausea and vomiting and was taking  phenergan for nausea. 12 lead EKG pending.   He denies CP. Has not felt any ICD shocks.    LVAD INTERROGATION:  HeartMate III LVAD:  Flow 3.5 liters/min, speed 5300, power 4, PI 4.3.  5 PI events  Interval hx: -Prolonged episode of VT overnight that self terminated with no effect on LVAD hemodynamics. -Continuing to have severe nausea -Improvement in blood glucose, no longer dropping   Objective:    Vital Signs:   Temp:  [97.6 F (36.4 C)-98 F (36.7 C)] 97.7 F (36.5 C) (06/02 1111) Pulse Rate:  [74-85] 74 (06/02 0400) Resp:  [15-20] 18 (06/02 1111) BP: (105-131)/(72-113) 105/86 (06/02 1111) SpO2:  [98 %-100 %] 100 % (06/02 0742) Weight:  [77.8 kg] 77.8 kg (06/02 0742) Last BM Date : 01/26/23 Filed Weights   02/04/23 0847 02/04/23 1225 02/05/23 0742  Weight: 80.6 kg 78 kg 77.8 kg    Mean arterial Pressure 98  Physical Exam    General: chronically ill appearing, lethargic but easy to arouse and will respond to questions  HEENT: Normal Neck: supple. JVP 7-8. Carotids 2+ bilat; no bruits. No lymphadenopathy or thyromegaly appreciated. Cor: Normal LVAD hum Lungs: CT bilaterally Abdomen: soft, nontender, nondistended. No hepatosplenomegaly. No bruits or masses. Good bowel sounds. Driveline: C/D/I; securement device intact and driveline incorporated Extremities: no cyanosis, clubbing, rash, edema + b/l  AKA  Neuro: lethargic but easy to arouse, altered. Oriented to person, not place or time. Affect flat   Telemetry   NSR   EKG   12 lead pending   Labs    Basic Metabolic Panel: Recent Labs  Lab 01/30/23 0106 02/03/23 0825 02/03/23 0900 02/03/23 1633 02/04/23 0013 02/05/23 0019 02/05/23 0657  NA 133* 132* 134* 129* 130* 131*  --   K 4.6 2.7* 2.8* 3.2* 3.4* 3.6  --   CL 95* 93*  --  91* 91* 94*  --   CO2 24 27  --  27 25 25   --   GLUCOSE 110* 63*  --  65* 44* 116*  --   BUN 52* 20  --  21* 24* 16  --   CREATININE 9.23* 6.13*  --  6.59* 7.26* 5.90*  --    CALCIUM 9.0 7.6*  --  7.6* 7.8* 8.2*  --   MG 2.1 1.7  --   --   --   --  1.9  PHOS  --   --   --  4.2  --   --   --      Liver Function Tests: Recent Labs  Lab 02/03/23 0825  AST 36  ALT 32  ALKPHOS 78  BILITOT 0.8  PROT 5.9*  ALBUMIN 2.7*    No results for input(s): "LIPASE", "AMYLASE" in the last 168 hours. Recent Labs  Lab 02/03/23 1931  AMMONIA 46*     CBC: Recent Labs  Lab 01/30/23 0106 02/03/23 0825 02/03/23 0900 02/04/23 0013 02/05/23 0019  WBC 6.0 7.9  --  7.8 6.9  NEUTROABS  --  6.1  --   --   --   HGB 9.3* 8.8* 10.2* 9.6* 9.0*  HCT 29.7* 26.9* 30.0* 29.7* 29.4*  MCV 87.9 87.9  --  87.6 90.5  PLT 137* 127*  --  122* 135*     Cardiac Enzymes: No results for input(s): "CKTOTAL", "CKMB", "CKMBINDEX", "TROPONINI" in the last 168 hours.  BNP: BNP (last 3 results) Recent Labs    02/03/23 0825  BNP 1,200.6*     ProBNP (last 3 results) No results for input(s): "PROBNP" in the last 8760 hours.   CBG: Recent Labs  Lab 02/04/23 2016 02/04/23 2307 02/05/23 0443 02/05/23 0750 02/05/23 1109  GLUCAP 75 137* 112* 104* 132*     Coagulation Studies: Recent Labs    02/03/23 0814 02/03/23 1104 02/04/23 0013 02/05/23 0051  LABPROT 43.5* 34.3* 30.6* 29.7*  INR 4.6* 3.4* 2.9* 2.8*     Other results: EKG: 12 lead EKG ordered/ pending   Imaging    No results found.    Patient Profile:   57 y.o. with history of CAD s/p anterior MI in 2010 and NSTEMI in 3/18 with DES to pRCA and mLAD and ischemic cardiomyopathy s/p Heartmate 3 LVAD in 2018 and ESRD on HD. He also had hx of ischemic lower extremities s/p bilateral AKAs. H/o repeated DL infections now on chronic cefadroxil for chronic suppression and recent hospitalization for refractory VT, brought into ED by EMS from HD for lethargy, hypotension, AMS and low flow alarm on VAD.   Assessment/Plan:    1. Lethargy/AMS  - CT scan with diffuse bladder wall thickening consistent with  cystitis; otherwise work up unremarkable. Will continue abx through today. Transition to PO tomorrow. Symptoms possibly secondary to recurrent hypoglycemia also.  -Blood pressure now significantly improved, in addition no longer having drops in blood glucose. -Still complaining  of severe nausea, will start IV Phenergan  2. Recent VT - recent admit for refractory VT, failed multiple ATPs. EP consulted. Device adjusted>>ATP in ED with conversion. Placed on oral amiodarone + quinidine w/ suppression of VT - continue PO amio and quinidine -Prolonged episode of VT last night lasting greater than 30 minutes.  No effects on LVAD hemodynamics.  Received amiodarone bolus.  Will continue p.o. amiodarone and quinidine.  Unfortunately I believe this is a sequela of progressively worsening RV dysfunction and terminal heart failure that is no longer amenable to LVAD therapies in the setting of ESRD and numerous comorbidities.  Will continue supportive care.  3. HFrEF, s/p HMIII, ICM  St Jude ICD.  NSTEMI in March 2018 with DES to LAD and RCA, complicated by cardiogenic shock, low output requiring milrinone.  Echo 8/18 with EF 20-25%, moderate MR. CPX (8/18) with severe functional impairment due to HF.  He is s/p HM3 LVAD placement. He had pump thrombosis 6/19 due to [facility] outflow graft kinking.  He had pump exchange for another HM3.  LVAD parameters stable but he does have PI events and low flow events at times with HD. He takes midodrine pre-HD, but needs hydralazine on non-HD days.  - Had Ramp Echo 5/21. Speed decreased to 5300 - Volume managed per HD. Holding HD today given suspected low volume status  - Continue midodrine 10 mg tid.  - On warfarin. INR 2.8  4. Driveline infection, chronic  - Continue cefadroxil long term   - Blood cx negative 48h.    5. ESRD  - iHD M-W-F  - session terminated early today given hypotension and low flow alarms - if readmitted, will consult nephrology to manage HD     6.  PAD - S/P bilateral AKA     I reviewed the LVAD parameters from today, and compared the results to the patient's prior recorded data.  No programming changes were made.  The LVAD is functioning within specified parameters.  The patient performs LVAD self-test daily.  LVAD interrogation was negative for any significant power changes, alarms or PI events/speed drops.  LVAD equipment check completed and is in good working order.  Back-up equipment present.   LVAD education done on emergency procedures and precautions and reviewed exit site care.  Length of Stay: 2  Dorthula Nettles, DO 02/05/2023, 12:37 PM  VAD Team Pager [phone] (7am - 7am) +++VAD ISSUES ONLY+++   Advanced Heart Failure Team Pager [phone] (M-F; 7a - 5p)  Please contact [facility] for night-coverage after hours (5p -7a ) and weekends on [email or web address] for all non- LVAD Issues

## 2023-02-06 DIAGNOSIS — I472 Ventricular tachycardia, unspecified: Secondary | ICD-10-CM

## 2023-02-06 LAB — CBC
HCT: 28.2 % — ABNORMAL LOW (ref 39.0–52.0)
Hemoglobin: 9.2 g/dL — ABNORMAL LOW (ref 13.0–17.0)
MCH: 29 pg (ref 26.0–34.0)
MCHC: 32.6 g/dL (ref 30.0–36.0)
MCV: 89 fL (ref 80.0–100.0)
Platelets: 125 10*3/uL — ABNORMAL LOW (ref 150–400)
RBC: 3.17 MIL/uL — ABNORMAL LOW (ref 4.22–5.81)
RDW: 17.2 % — ABNORMAL HIGH (ref 11.5–15.5)
WBC: 6.5 10*3/uL (ref 4.0–10.5)
nRBC: 0 % (ref 0.0–0.2)

## 2023-02-06 LAB — PROTIME-INR
INR: 3.3 — ABNORMAL HIGH (ref 0.8–1.2)
Prothrombin Time: 33.6 seconds — ABNORMAL HIGH (ref 11.4–15.2)

## 2023-02-06 LAB — BASIC METABOLIC PANEL
Anion gap: 11 (ref 5–15)
BUN: 21 mg/dL — ABNORMAL HIGH (ref 6–20)
CO2: 25 mmol/L (ref 22–32)
Calcium: 8.2 mg/dL — ABNORMAL LOW (ref 8.9–10.3)
Chloride: 93 mmol/L — ABNORMAL LOW (ref 98–111)
Creatinine, Ser: 7.62 mg/dL — ABNORMAL HIGH (ref 0.61–1.24)
GFR, Estimated: 8 mL/min — ABNORMAL LOW (ref >60)
Glucose, Bld: 156 mg/dL — ABNORMAL HIGH (ref 70–99)
Potassium: 3.6 mmol/L (ref 3.5–5.1)
Sodium: 129 mmol/L — ABNORMAL LOW (ref 135–145)

## 2023-02-06 LAB — GLUCOSE, CAPILLARY
Glucose-Capillary: 103 mg/dL — ABNORMAL HIGH (ref 70–99)
Glucose-Capillary: 103 mg/dL — ABNORMAL HIGH (ref 70–99)
Glucose-Capillary: 104 mg/dL — ABNORMAL HIGH (ref 70–99)
Glucose-Capillary: 105 mg/dL — ABNORMAL HIGH (ref 70–99)
Glucose-Capillary: 146 mg/dL — ABNORMAL HIGH (ref 70–99)
Glucose-Capillary: 76 mg/dL (ref 70–99)
Glucose-Capillary: 96 mg/dL (ref 70–99)
Glucose-Capillary: 99 mg/dL (ref 70–99)

## 2023-02-06 LAB — CULTURE, BLOOD (ROUTINE X 2)

## 2023-02-06 LAB — LACTATE DEHYDROGENASE: LDH: 134 U/L (ref 98–192)

## 2023-02-06 MED ORDER — LORAZEPAM 2 MG/ML IJ SOLN
0.5000 mg | Freq: Once | INTRAMUSCULAR | Status: AC
  Administered 2023-02-06: 0.5 mg via INTRAVENOUS
  Filled 2023-02-06: qty 1

## 2023-02-06 MED ORDER — HEPARIN SODIUM (PORCINE) 1000 UNIT/ML IJ SOLN
INTRAMUSCULAR | Status: AC
  Administered 2023-02-06: 3400 [IU]
  Filled 2023-02-06: qty 2

## 2023-02-06 MED ORDER — WARFARIN 0.5 MG HALF TABLET
0.5000 mg | ORAL_TABLET | Freq: Once | ORAL | Status: AC
  Administered 2023-02-06: 0.5 mg via ORAL
  Filled 2023-02-06: qty 1

## 2023-02-06 MED ORDER — HEPARIN SODIUM (PORCINE) 1000 UNIT/ML IJ SOLN
INTRAMUSCULAR | Status: AC
  Filled 2023-02-06: qty 4

## 2023-02-06 NOTE — Progress Notes (Signed)
ANTICOAGULATION CONSULT NOTE - Follow Up Consult  Pharmacy Consult for warfarin Indication: LVAD  Allergies  Allergen Reactions   Metformin And Related Diarrhea    Patient Measurements: Weight: 78.3 kg (172 lb 9.9 oz)  Vital Signs: Temp: 97.6 F (36.4 C) (06/03 1248) Temp Source: Oral (06/03 1248) BP: 130/94 (06/03 1248) Pulse Rate: 78 (06/03 1248)  Labs: Recent Labs    02/04/23 0013 02/05/23 0019 02/05/23 0051 02/06/23 0014  HGB 9.6* 9.0*  --  9.2*  HCT 29.7* 29.4*  --  28.2*  PLT 122* 135*  --  125*  LABPROT 30.6*  --  29.7* 33.6*  INR 2.9*  --  2.8* 3.3*  CREATININE 7.26* 5.90*  --  7.62*     Estimated Creatinine Clearance: 10.2 mL/min (A) (by C-G formula based on SCr of 7.62 mg/dL (H)).   Medical History: Past Medical History:  Diagnosis Date   Angina    ASCVD (arteriosclerotic cardiovascular disease)    , Anterior infarction 2005, LAD diagonal bifurcation intervention 03/2004   Automatic implantable cardiac defibrillator -St. 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 Last admission, amiodarone was increased, quinidine added, and continues on cefadroxil  for chronic suppression. Admit INR 3.4 at above goal.   Last 2 admits he has been requiring less warfarin than PTA No significant medication changes that would affect INR  Hgb 9, PLTc 120-130s  No bleeding noted.   Goal of Therapy:  INR 2-3 Monitor platelets by anticoagulation protocol: Yes   Plan:  Warfarin 0.5 mg today Daily CBC and INR    Leota Sauers Pharm.D. CPP, BCPS Clinical Pharmacist [phone] 02/06/2023 1:41 PM

## 2023-02-06 NOTE — Progress Notes (Addendum)
Transported into HD with his nurse,patient has LVAD.Alertand oriented x 4.  Access used ; Left HD catheter that worked well.  that worked well.Dressing on date.  Medicines given. Zofran 4 mg IV.                             Hectorol 5 mcg.  Duration of treatment:3.5 hours.  Fluid removed :  Achieved fluid goal of 2 liters.  Hemo comment: Tolerated treatment well.  Hand off to the patient's nurse.

## 2023-02-06 NOTE — Procedures (Signed)
I was present at this dialysis session. I have reviewed the session itself and made appropriate changes.   Vital signs in last 24 hours:  Temp:  [97.7 F (36.5 C)-98.1 F (36.7 C)] 97.7 F (36.5 C) (06/03 0730) Pulse Rate:  [54-94] 54 (06/03 0730) Resp:  [11-18] 11 (06/03 0730) BP: (98-141)/(79-107) 113/79 (06/03 0730) SpO2:  [95 %-100 %] 98 % (06/03 0730) Weight:  [78.8 kg] 78.8 kg (06/03 0447) Weight change: -2.8 kg Filed Weights   02/04/23 1225 02/05/23 0742 02/06/23 0447  Weight: 78 kg 77.8 kg 78.8 kg    Recent Labs  Lab 02/03/23 1633 02/04/23 0013 02/06/23 0014  NA 129*   < > 129*  K 3.2*   < > 3.6  CL 91*   < > 93*  CO2 27   < > 25  GLUCOSE 65*   < > 156*  BUN 21*   < > 21*  CREATININE 6.59*   < > 7.62*  CALCIUM 7.6*   < > 8.2*  PHOS 4.2  --   --    < > = values in this interval not displayed.    Recent Labs  Lab 02/03/23 0825 02/03/23 0900 02/04/23 0013 02/05/23 0019 02/06/23 0014  WBC 7.9  --  7.8 6.9 6.5  NEUTROABS 6.1  --   --   --   --   HGB 8.8*   < > 9.6* 9.0* 9.2*  HCT 26.9*   < > 29.7* 29.4* 28.2*  MCV 87.9  --  87.6 90.5 89.0  PLT 127*  --  122* 135* 125*   < > = values in this interval not displayed.    Scheduled Meds:  amiodarone  400 mg Oral Daily   aspirin EC  81 mg Oral Daily   atorvastatin  40 mg Oral q1800   cefadroxil  1,000 mg Oral Q M,W,F-1800   Chlorhexidine Gluconate Cloth  6 each Topical Q0600   cinacalcet  180 mg Oral Q M,W,F-HD   citalopram  20 mg Oral Daily   doxercalciferol  5 mcg Intravenous Q M,W,F-HD   heparin sodium (porcine)  2,000 Units Intravenous Once   heparin sodium (porcine)  5,000 Units Intravenous Once   loratadine  10 mg Oral Daily   midodrine  10 mg Oral Q M,W,F   pantoprazole  40 mg Oral BID AC   quiNIDine sulfate  200 mg Oral BID   sevelamer carbonate  0.8 g Oral TID WC   Warfarin - Pharmacist Dosing Inpatient   Does not apply q1600   Continuous Infusions:  promethazine (PHENERGAN) injection (IM or  IVPB) 12.5 mg (02/06/23 0042)   PRN Meds:.acetaminophen, ondansetron (ZOFRAN) IV, promethazine (PHENERGAN) injection (IM or IVPB), promethazine   Irena Cords,  MD 02/06/2023, 8:17 AM

## 2023-02-06 NOTE — Progress Notes (Signed)
LVAD Coordinator Rounding Note:  Admitted 02/04/23 due to AMS, hypotension, and hypoglycemia.   HM 3 LVAD implanted on 03/01/18 by Dr Donata Clay under destination therapy criteria.  CT head 02/03/23: No evidence of acute intracranial abnormality.   CT chest/abd/pelvis 02/03/23: 1. Diffuse bladder wall thickening with some inflammatory changes about the urinary bladder. This is concerning for cystitis. 2. Stable 5 mm nodule in the left upper lobe since the February exam. It has increased proximally 1 cm in average axial dimension since 2019. No follow-up needed if patient is low-risk (and has no known or suspected primary neoplasm). Non-contrast chest CT can be considered in 12 months if patient is high-risk. This recommendation follows the consensus statement: Guidelines for Management of Incidental Pulmonary Nodules Detected on CT Images: From the Fleischner Society 2017; Radiology 2017; 284:228-243. 3. The superior segment nodule in the right lower lobe nodule is obscured by airspace disease. 4. Cardiomegaly with left ventricular assist device and left chest ICD. 5. Coronary artery disease. 6. Small paraumbilical hernia contains fat without bowel. 7.  Aortic Atherosclerosis (ICD10-I70.0)   Pt asleep on my arrival. Completed HD this morning. Per bedside RN- complaining of intermittent nausea. PRN Ativan and Phenergan given as needed.   Completed course of antibiotics for suspected cystitis. Now on chronic suppressive Cefadroxil.   Vital signs: HR: 74 Doppler Pressure: 102 Automatic BP: 130/94 (103) O2 Sat: 98% on RA Wt: 172.6 lbs    LVAD interrogation reveals:  Speed: 5300 Flow: 3.3 Power: 3.9 w PI: 7.9   Alarms: none Events: 10 PI events today  Hematocrit: 20- DO NOT TITRATE  Fixed speed: 5300 Low speed limit: 5000  Drive Line: Dressing clean, dry, and intact. Anchor correctly applied. Weekly dressing changes using daily kit. Next dressing change due 02/10/23 per bedside RN.    Labs:  LDH trend: 134  INR trend: 3.3  Anticoagulation Plan: -INR Goal: 2.0 - 3.0 -ASA Dose: 81 mg daily  Device: -St Jude -Therapies: VF 240          VT 200   Arrythmias: hx of recurrent VT. Started on Quinidine 01/27/23.   Infection:  02/03/23>>blood cultures>> no growth x 3 days  Adverse Events on VAD: -Recent admission for VT. Started on Quinidine.   Plan/Recommendations:  1. Page VAD coordinator for any equipment or drive line issues 2. Weekly drive line dressing changes per bedside RN  Alyce Pagan RN VAD Coordinator  Office: [phone]  24/7 Pager: [phone]

## 2023-02-06 NOTE — Progress Notes (Addendum)
Advanced Heart Failure VAD Team Note  PCP-Cardiologist: None   Subjective:    Much more alert today. Just completed HD. Says he tolerated session ok. C/w mild nausea but no further vomiting.   Denies dyspnea.    LVAD INTERROGATION:  HeartMate III LVAD:   Flow 3.6 liters/min, speed 5300, power 4.1, PI 4.2.    Objective:    Vital Signs:   Temp:  [97.3 F (36.3 C)-98.1 F (36.7 C)] 97.3 F (36.3 C) (06/03 0815) Pulse Rate:  [54-94] 75 (06/03 1130) Resp:  [10-22] 16 (06/03 1130) BP: (98-152)/(79-132) 131/112 (06/03 1130) SpO2:  [95 %-100 %] 100 % (06/03 1130) Weight:  [78.8 kg-80.3 kg] 80.3 kg (06/03 0815) Last BM Date : 01/26/23 Mean arterial Pressure 92  Intake/Output:   Intake/Output Summary (Last 24 hours) at 02/06/2023 1155 Last data filed at 02/06/2023 0900 Gross per 24 hour  Intake 693.06 ml  Output --  Net 693.06 ml     Physical Exam    General: chronically ill appearing No resp difficulty HEENT: normal Neck: supple. JVP not elevated . Carotids 2+ bilat; no bruits. No lymphadenopathy or thyromegaly appreciated. Cor: Mechanical heart sounds with LVAD hum present. Lungs: clear Abdomen: soft, nontender, nondistended. No hepatosplenomegaly. No bruits or masses. Good bowel sounds. Driveline: C/D/I; securement device intact and driveline incorporated Extremities: no cyanosis, clubbing, rash, edema b/l AKA Neuro: alert & orientedx3, cranial nerves grossly intact. moves all 4 extremities w/o difficulty. Affect pleasant   Telemetry   NSR 80s   EKG    No new EKG today  Labs   Basic Metabolic Panel: Recent Labs  Lab 02/03/23 0825 02/03/23 0900 02/03/23 1633 02/04/23 0013 02/05/23 0019 02/05/23 0657 02/06/23 0014  NA 132* 134* 129* 130* 131*  --  129*  K 2.7* 2.8* 3.2* 3.4* 3.6  --  3.6  CL 93*  --  91* 91* 94*  --  93*  CO2 27  --  27 25 25   --  25  GLUCOSE 63*  --  65* 44* 116*  --  156*  BUN 20  --  21* 24* 16  --  21*  CREATININE 6.13*  --   6.59* 7.26* 5.90*  --  7.62*  CALCIUM 7.6*  --  7.6* 7.8* 8.2*  --  8.2*  MG 1.7  --   --   --   --  1.9  --   PHOS  --   --  4.2  --   --   --   --     Liver Function Tests: Recent Labs  Lab 02/03/23 0825  AST 36  ALT 32  ALKPHOS 78  BILITOT 0.8  PROT 5.9*  ALBUMIN 2.7*   No results for input(s): "LIPASE", "AMYLASE" in the last 168 hours. Recent Labs  Lab 02/03/23 1931  AMMONIA 46*    CBC: Recent Labs  Lab 02/03/23 0825 02/03/23 0900 02/04/23 0013 02/05/23 0019 02/06/23 0014  WBC 7.9  --  7.8 6.9 6.5  NEUTROABS 6.1  --   --   --   --   HGB 8.8* 10.2* 9.6* 9.0* 9.2*  HCT 26.9* 30.0* 29.7* 29.4* 28.2*  MCV 87.9  --  87.6 90.5 89.0  PLT 127*  --  122* 135* 125*    INR: Recent Labs  Lab 02/03/23 0814 02/03/23 1104 02/04/23 0013 02/05/23 0051 02/06/23 0014  INR 4.6* 3.4* 2.9* 2.8* 3.3*    Other results: EKG:    Imaging   No results found.  Medications:     Scheduled Medications:  amiodarone  400 mg Oral Daily   aspirin EC  81 mg Oral Daily   atorvastatin  40 mg Oral q1800   cefadroxil  1,000 mg Oral Q M,W,F-1800   Chlorhexidine Gluconate Cloth  6 each Topical Q0600   cinacalcet  180 mg Oral Q M,W,F-HD   citalopram  20 mg Oral Daily   doxercalciferol  5 mcg Intravenous Q M,W,F-HD   heparin sodium (porcine)       loratadine  10 mg Oral Daily   midodrine  10 mg Oral Q M,W,F   pantoprazole  40 mg Oral BID AC   quiNIDine sulfate  200 mg Oral BID   sevelamer carbonate  0.8 g Oral TID WC   Warfarin - Pharmacist Dosing Inpatient   Does not apply q1600    Infusions:  promethazine (PHENERGAN) injection (IM or IVPB) 12.5 mg (02/06/23 0042)    PRN Medications: acetaminophen, heparin sodium (porcine), ondansetron (ZOFRAN) IV, promethazine (PHENERGAN) injection (IM or IVPB), promethazine   Patient Profile   57 y.o. with history of CAD s/p anterior MI in 2010 and NSTEMI in 3/18 with DES to pRCA and mLAD and ischemic cardiomyopathy s/p Heartmate  3 LVAD in 2018 and ESRD on HD, recent admit for refractory VT. He also had hx of ischemic lower extremities s/p bilateral AKAs. Readmitted 5/31 for lethargy and AMS. AHF team primary with VAD.   Assessment/Plan:   1. Lethargy/AMS  - CT scan with diffuse bladder wall thickening consistent with cystitis; otherwise work up unremarkable. Treated with abx. Symptoms possibly secondary to recurrent hypoglycemia also.  -Blood pressure now significantly improved, in addition no longer having drops in blood glucose. - much more alert today. No AMS  -Still complaining of nausea, no longer vomiting. Cont IV Phenergan   2. Recent VT - recent admit for refractory VT, failed multiple ATPs. EP consulted. Device adjusted>>ATP in ED with conversion. Placed on oral amiodarone + quinidine w/ suppression of VT - Now on PO amio and quinidine  -Prolonged episode of VT 6/1 lasting greater than 30 minutes.  No effects on LVAD hemodynamics.  Received amiodarone bolus.   - Will continue p.o. amiodarone and quinidine.  Unfortunately I believe this is a sequela of progressively worsening RV dysfunction and terminal heart failure that is no longer amenable to LVAD therapies in the setting of ESRD and numerous comorbidities.  Will continue supportive care.   3. HFrEF, s/p HMIII, ICM  St Jude ICD.  NSTEMI in March 2018 with DES to LAD and RCA, complicated by cardiogenic shock, low output requiring milrinone.  Echo 8/18 with EF 20-25%, moderate MR. CPX (8/18) with severe functional impairment due to HF.  He is s/p HM3 LVAD placement. He had pump thrombosis 6/19 due to [facility] outflow graft kinking.  He had pump exchange for another HM3.  LVAD parameters stable but he does have PI events and low flow events at times with HD. He takes midodrine pre-HD, but needs hydralazine on non-HD days.  - Had Ramp Echo 5/21. Speed decreased to 5300 - Volume managed per HD. Appreciated nephrology's assistance  - Continue midodrine 10 mg tid.  - On  warfarin. INR 3.3  4. Driveline infection, chronic  - Continue cefadroxil long term   - Blood cx negative x 3 days    5. ESRD  - iHD M-W-F  - nephrology following   6. PAD - S/P bilateral AKA  I reviewed the LVAD parameters from today,  and compared the results to the patient's prior recorded data.  No programming changes were made.  The LVAD is functioning within specified parameters.  The patient performs LVAD self-test daily.  LVAD interrogation was negative for any significant power changes, alarms or PI events/speed drops.  LVAD equipment check completed and is in good working order.  Back-up equipment present.   LVAD education done on emergency procedures and precautions and reviewed exit site care.  Length of Stay: 3   02/06/2023, 11:55 AM  VAD Team --- VAD ISSUES ONLY--- Pager [phone] (7am - 7am)  Advanced Heart Failure Team  Pager [phone] (M-F; 7a - 5p)  Please contact [facility] for night-coverage after hours (5p -7a ) and weekends on [email or web address]  Patient seen with PA, agree with the above note.   He has completed abx for possible cystitis.  Awake and alert today.  No arrhythmias.  He had HD today with no problems. Main complaint is ongoing nausea.   General: Well appearing this am. NAD.  HEENT: Normal. Neck: Supple, JVP 7-8 cm. Carotids OK.  Cardiac:  Mechanical heart sounds with LVAD hum present.  Lungs:  CTAB, normal effort.  Abdomen:  NT, ND, no HSM. No bruits or masses. +BS  LVAD exit site: Well-healed and incorporated. Dressing dry and intact. No erythema or drainage. Stabilization device present and accurately applied. Driveline dressing changed daily per sterile technique. Extremities:  Bilateral AKAs Neuro:  Alert & oriented x 3. Cranial nerves grossly intact. Moves all 4 extremities w/o difficulty. Affect pleasant    With nausea, will watch today and try to get him home tomorrow.  He says that the combination of phenergan and lorazepam helps his nausea,  will give.   He has completed treatment for possible UTI/cystitis, now on his chronic suppressive antibiotic (cefadroxil).    No further VT on amiodarone and quinidine.  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   Gretchen Watwood 02/06/2023 1:36 PM

## 2023-02-06 NOTE — Progress Notes (Signed)
Pt receives out-pt HD at [facility] on MWF. Will assist as needed.   Lekeshia Kram Renal Navigator [phone] 

## 2023-02-06 NOTE — Care Management Important Message (Signed)
Important Message  Patient Details  Name: [name] MRN: [id number] Date of Birth: 1966/02/21   Medicare Important Message Given:  Yes     Znya Albino 02/06/2023, 3:26 PM

## 2023-02-07 ENCOUNTER — Encounter (HOSPITAL_COMMUNITY): Payer: Self-pay | Admitting: Registered Nurse

## 2023-02-07 ENCOUNTER — Encounter (HOSPITAL_COMMUNITY)
Admission: EM | Disposition: A | Discharge status: Home / Self Care | Payer: Self-pay | Source: Home / Self Care | Attending: Cardiology

## 2023-02-07 ENCOUNTER — Encounter (HOSPITAL_COMMUNITY): Payer: Medicare Other | Admitting: Cardiology

## 2023-02-07 DIAGNOSIS — I472 Ventricular tachycardia, unspecified: Secondary | ICD-10-CM | POA: Diagnosis not present

## 2023-02-07 LAB — GLUCOSE, CAPILLARY
Glucose-Capillary: 101 mg/dL — ABNORMAL HIGH (ref 70–99)
Glucose-Capillary: 112 mg/dL — ABNORMAL HIGH (ref 70–99)
Glucose-Capillary: 117 mg/dL — ABNORMAL HIGH (ref 70–99)
Glucose-Capillary: 122 mg/dL — ABNORMAL HIGH (ref 70–99)
Glucose-Capillary: 124 mg/dL — ABNORMAL HIGH (ref 70–99)
Glucose-Capillary: 142 mg/dL — ABNORMAL HIGH (ref 70–99)

## 2023-02-07 LAB — BASIC METABOLIC PANEL
Anion gap: 12 (ref 5–15)
BUN: 13 mg/dL (ref 6–20)
CO2: 24 mmol/L (ref 22–32)
Calcium: 8.4 mg/dL — ABNORMAL LOW (ref 8.9–10.3)
Chloride: 95 mmol/L — ABNORMAL LOW (ref 98–111)
Creatinine, Ser: 5.76 mg/dL — ABNORMAL HIGH (ref 0.61–1.24)
GFR, Estimated: 11 mL/min — ABNORMAL LOW (ref >60)
Glucose, Bld: 100 mg/dL — ABNORMAL HIGH (ref 70–99)
Potassium: 4.1 mmol/L (ref 3.5–5.1)
Sodium: 131 mmol/L — ABNORMAL LOW (ref 135–145)

## 2023-02-07 LAB — PROTIME-INR
INR: 3.2 — ABNORMAL HIGH (ref 0.8–1.2)
Prothrombin Time: 32.9 seconds — ABNORMAL HIGH (ref 11.4–15.2)

## 2023-02-07 LAB — CBC
HCT: 31.3 % — ABNORMAL LOW (ref 39.0–52.0)
Hemoglobin: 9.9 g/dL — ABNORMAL LOW (ref 13.0–17.0)
MCH: 28.2 pg (ref 26.0–34.0)
MCHC: 31.6 g/dL (ref 30.0–36.0)
MCV: 89.2 fL (ref 80.0–100.0)
Platelets: 125 10*3/uL — ABNORMAL LOW (ref 150–400)
RBC: 3.51 MIL/uL — ABNORMAL LOW (ref 4.22–5.81)
RDW: 16.8 % — ABNORMAL HIGH (ref 11.5–15.5)
WBC: 6.8 10*3/uL (ref 4.0–10.5)
nRBC: 0 % (ref 0.0–0.2)

## 2023-02-07 LAB — MAGNESIUM: Magnesium: 1.8 mg/dL (ref 1.7–2.4)

## 2023-02-07 LAB — LACTATE DEHYDROGENASE: LDH: 219 U/L — ABNORMAL HIGH (ref 98–192)

## 2023-02-07 SURGERY — CARDIOVERSION
Anesthesia: General

## 2023-02-07 MED ORDER — METOCLOPRAMIDE HCL 5 MG PO TABS
5.0000 mg | ORAL_TABLET | Freq: Three times a day (TID) | ORAL | Status: DC
  Administered 2023-02-07 – 2023-02-13 (×22): 5 mg via ORAL
  Filled 2023-02-07 (×23): qty 1

## 2023-02-07 MED ORDER — SODIUM CHLORIDE 0.9 % IV SOLN: INTRAVENOUS | Status: DC

## 2023-02-07 MED ORDER — AMIODARONE HCL IN DEXTROSE 360-4.14 MG/200ML-% IV SOLN
60.0000 mg/h | INTRAVENOUS | Status: DC
  Administered 2023-02-07 (×2): 60 mg/h via INTRAVENOUS
  Filled 2023-02-07: qty 200

## 2023-02-07 MED ORDER — AMIODARONE HCL IN DEXTROSE 360-4.14 MG/200ML-% IV SOLN
INTRAVENOUS | Status: AC
  Filled 2023-02-07: qty 200

## 2023-02-07 MED ORDER — QUINIDINE SULFATE 200 MG PO TABS
200.0000 mg | ORAL_TABLET | Freq: Three times a day (TID) | ORAL | Status: AC
  Administered 2023-02-07 – 2023-02-10 (×10): 200 mg via ORAL
  Filled 2023-02-07 (×11): qty 1

## 2023-02-07 MED ORDER — LORAZEPAM 2 MG/ML IJ SOLN
0.5000 mg | Freq: Once | INTRAMUSCULAR | Status: AC
  Administered 2023-02-07: 0.5 mg via INTRAVENOUS
  Filled 2023-02-07: qty 1

## 2023-02-07 MED ORDER — AMIODARONE HCL IN DEXTROSE 360-4.14 MG/200ML-% IV SOLN
60.0000 mg/h | INTRAVENOUS | Status: DC
  Administered 2023-02-08: 60 mg/h via INTRAVENOUS
  Administered 2023-02-08 (×2): 30 mg/h via INTRAVENOUS
  Administered 2023-02-09: 60 mg/h via INTRAVENOUS
  Administered 2023-02-09 – 2023-02-10 (×2): 30 mg/h via INTRAVENOUS
  Administered 2023-02-10 – 2023-02-12 (×8): 60 mg/h via INTRAVENOUS
  Filled 2023-02-07 (×16): qty 200

## 2023-02-07 MED ORDER — WARFARIN 0.5 MG HALF TABLET
0.5000 mg | ORAL_TABLET | Freq: Once | ORAL | Status: AC
  Administered 2023-02-07: 0.5 mg via ORAL
  Filled 2023-02-07 (×2): qty 1

## 2023-02-07 NOTE — Progress Notes (Signed)
Pt has been having tele alarms possible  Vtach or wide QRS , EKG was done- SEE EPIC. Lvad parameters are stable. Doppler 100-110. Pt feel nauseous. DR Juel Burrow was made aware and he check the EKG's.  Continue monitoring. - to check Mag added to the lab.

## 2023-02-07 NOTE — Consult Note (Addendum)
ELECTROPHYSIOLOGY CONSULT NOTE    Patient ID: [name] MRN: [id number], DOB/AGE: 11/07/1965 57 y.o.  Admit date: 02/03/2023 Date of Consult: 02/07/2023  Primary Physician: Erick Alley, DO Primary Cardiologist: None  Electrophysiologist: Dr. Graciela Husbands   Referring Provider: Dr. Shirlee Latch  Patient Profile: [name] is a 57 y.o. male with a  history of VT with h/o shocks on amiodarone and mexitil, CAD, ICM s/p HM3 LVAD, gastroparesis,  and ESRD who is being seen today for the evaluation of slow VT at the request of Dr. Shirlee Latch.  HPI:  [name] is a 57 y.o. male well known to both HF and EP teams.  Recently admitted 5/24 - 5/27 with VT with shocks during HD, and exhausted ATP therapies. Started on quinidine in combination with his Amiodarone.  Pt was paced out via his device and ATP schemes made more aggressive. VT remained quiescent on quinidine + amiodarone and was d/c.  Pt returned to [facility] 6/1 with on-going lethargy.  Work up consistent with cystitis, otherwise unremarkable. Also had hypoglycemia. Overnight into 6/4 he began to have intermittent WCT ~100 bpm. EP consulted to check his device which did reveal slow VT and for recommendations.  Currently, pt is very fatigued with malaise. Otherwise feeling somewhat better from admission. Less nauseated. Completed ABx for cystitis.    Labs Potassium4.1 (06/04 0040) Magnesium  1.8 (06/04 0040) Creatinine, ser  5.76* (06/04 0040) PLT  125* (06/04 0040) HGB  9.9* (06/04 0040) WBC 6.8 (06/04 0040)  .    Past Medical History:  Diagnosis Date   Angina    ASCVD (arteriosclerotic cardiovascular disease)    , Anterior infarction 2005, LAD diagonal bifurcation intervention 03/2004   Automatic implantable cardiac defibrillator -St. Jude's        Benign neoplasm of colon    CHF (congestive heart failure) (HCC)    Chronic systolic heart failure (HCC)    Coronary artery disease     Widely patent previously placed  stents in the left anterior    Crohn's disease (HCC)    Deep venous thrombosis (HCC)    Recurrent-on Coumadin   Dialysis patient (HCC)    Dyspnea    Gastroparesis    GERD (gastroesophageal reflux disease)    High cholesterol    Hyperlipidemia    Hypersomnolent    Previous diagnosis of narcolepsy   Hypertension, essential    Ischemic cardiomyopathy    Ejection fraction 15-20% catheterization 2010   Type II or unspecified type diabetes mellitus without mention of complication, not stated as uncontrolled    Unspecified gastritis and gastroduodenitis without mention of hemorrhage      Surgical History:  Past Surgical History:  Procedure Laterality Date   A/V SHUNTOGRAM N/A 05/24/2018   Procedure: A/V SHUNTOGRAM;  Surgeon: Cephus Shelling, MD;  Location: [facility] INVASIVE CV LAB;  Service: Cardiovascular;  Laterality: N/A;   ABDOMINAL AORTOGRAM W/LOWER EXTREMITY N/A 03/18/2019   Procedure: ABDOMINAL AORTOGRAM W/LOWER EXTREMITY;  Surgeon: Maeola Harman, MD;  Location: [facility] INVASIVE CV LAB;  Service: Cardiovascular;  Laterality: N/A;   AMPUTATION Right 03/21/2019   Procedure: AMPUTATION DIGIT RIGHT FOURTH TOE;  Surgeon: Maeola Harman, MD;  Location: [facility] OR;  Service: Vascular;  Laterality: Right;   AMPUTATION Left 05/29/2019   Procedure: AMPUTATION LEFT GREAT TOE;  Surgeon: Nada Libman, MD;  Location: [facility] OR;  Service: Vascular;  Laterality: Left;   AMPUTATION Right 06/02/2019   Procedure: ABOVE KNEE AMPUTATION;  Surgeon: Larina Earthly,  MD;  Location: [facility] OR;  Service: Vascular;  Laterality: Right;   AMPUTATION Left 07/23/2019   Procedure: AMPUTATION ABOVE KNEE LEFT;  Surgeon: Sherren Kerns, MD;  Location: [facility] OR;  Service: Vascular;  Laterality: Left;   APPENDECTOMY     AV FISTULA PLACEMENT Right 06/26/2017   Procedure: ARTERIOVENOUS (AV) FISTULA CREATION VERSUS GRAFT RIGHT  ARM;  Surgeon: Fransisco Hertz, MD;  Location: [facility] OR;  Service: Vascular;  Laterality: Right;    AV FISTULA PLACEMENT Right 10/31/2017   Procedure: INSERTION OF ARTERIOVENOUS (AV) ARTEGRAFT,  RIGHT UPPER ARM;  Surgeon: Fransisco Hertz, MD;  Location: [facility] OR;  Service: Vascular;  Laterality: Right;   AV FISTULA PLACEMENT Left 05/29/2018   Procedure: ARTERIOVENOUS (AV) FISTULA CREATION  LEFT UPPER EXTREMITY;  Surgeon: Maeola Harman, MD;  Location: [facility] OR;  Service: Vascular;  Laterality: Left;   AV FISTULA PLACEMENT Left 08/09/2018   Procedure: INSERTION OF ARTERIOVENOUS (AV) GORE-TEX GRAFT LEFT UPPER ARM;  Surgeon: Maeola Harman, MD;  Location: [facility] OR;  Service: Vascular;  Laterality: Left;   CARDIAC DEFIBRILLATOR PLACEMENT  2010   St. Jude ICD   CENTRAL LINE INSERTION Right 05/24/2018   Procedure: CENTRAL LINE INSERTION;  Surgeon: Cephus Shelling, MD;  Location: [facility] INVASIVE CV LAB;  Service: Cardiovascular;  Laterality: Right;   COLECTOMY  ~ 2003   "for Crohn's" ascending colon.     CORONARY STENT INTERVENTION N/A 11/25/2016   Procedure: Coronary Stent Intervention;  Surgeon: Marykay Lex, MD;  Location: [facility] INVASIVE CV LAB;  Service: Cardiovascular;  Laterality: N/A;   EP IMPLANTABLE DEVICE N/A 09/14/2016   Procedure: ICD Generator Changeout;  Surgeon: Duke Salvia, MD;  Location: [facility] INVASIVE CV LAB;  Service: Cardiovascular;  Laterality: N/A;   ESOPHAGOGASTRODUODENOSCOPY N/A 07/12/2017   Procedure: ESOPHAGOGASTRODUODENOSCOPY (EGD);  Surgeon: Beverley Fiedler, MD;  Location: [facility] ENDOSCOPY;  Service: Gastroenterology;  Laterality: N/A;  VAD pt so VAD team Kateria Cutrona need to accompany pt.    FETAL SURGERY FOR CONGENITAL HERNIA     ????? pt has no knowledge of this.[name]     FISTULOGRAM Left 08/09/2018   Procedure: FISTULOGRAM LEFT ARM;  Surgeon: Maeola Harman, MD;  Location: [facility] OR;  Service: Vascular;  Laterality: Left;   INGUINAL HERNIA REPAIR     INSERTION OF DIALYSIS CATHETER Right 06/26/2017   Procedure: INSERTION OF TUNNELED  DIALYSIS CATHETER RIGHT INTERNAL  JUGULAR;  Surgeon: Fransisco Hertz, MD;  Location: [facility] OR;  Service: Vascular;  Laterality: Right;   INSERTION OF IMPLANTABLE LEFT VENTRICULAR ASSIST DEVICE N/A 06/12/2017   Procedure: INSERTION OF IMPLANTABLE LEFT VENTRICULAR ASSIST DEVICE-HEARTMATE 3;  Surgeon: Kerin Perna, MD;  Location: [facility] OR;  Service: Open Heart Surgery;  Laterality: N/A;  HM3 LVAD  CIRC ARREST  NITRIC OXIDE   INSERTION OF IMPLANTABLE LEFT VENTRICULAR ASSIST DEVICE N/A 02/28/2018   Procedure: REDO INSERTION OF IMPLANTABLE LEFT VENTRICULAR ASSIST DEVICE - HEARTMATE 3;  Surgeon: Kerin Perna, MD;  Location: [facility] OR;  Service: Open Heart Surgery;  Laterality: N/A;   IR FLUORO GUIDE CV LINE RIGHT  07/22/2019   IR REMOVAL TUN CV CATH W/O FL  02/26/2018   IR THORACENTESIS ASP PLEURAL SPACE W/IMG GUIDE  06/28/2017   IR US GUIDE VASC ACCESS RIGHT  07/22/2019   LIGATION OF ARTERIOVENOUS  FISTULA Right 10/31/2017   Procedure: LIGATION OF BRACHIO-BASILIC VEIN TRANSPOSITION RIGHT ARM;  Surgeon: Fransisco Hertz, MD;  Location: [facility] OR;  Service: Vascular;  Laterality:  Right;   LOWER EXTREMITY ANGIOGRAPHY  05/02/2019   Procedure: Lower Extremity Angiography;  Surgeon: Cephus Shelling, MD;  Location: [facility] INVASIVE CV LAB;  Service: Cardiovascular;;   MULTIPLE EXTRACTIONS WITH ALVEOLOPLASTY N/A 06/08/2017   Procedure: MULTIPLE EXTRACTION WITH ALVEOLOPLASTY AND PRE PROSTHETIC SURGERY AS NEEDED;  Surgeon: Charlynne Pander, DDS;  Location: [facility] OR;  Service: Oral Surgery;  Laterality: N/A;   PERIPHERAL VASCULAR BALLOON ANGIOPLASTY  03/18/2019   Procedure: PERIPHERAL VASCULAR BALLOON ANGIOPLASTY;  Surgeon: Maeola Harman, MD;  Location: [facility] INVASIVE CV LAB;  Service: Cardiovascular;;  RT PT   PERIPHERAL VASCULAR INTERVENTION  03/18/2019   Procedure: PERIPHERAL VASCULAR INTERVENTION;  Surgeon: Maeola Harman, MD;  Location: [facility] INVASIVE CV LAB;  Service: Cardiovascular;;  RT SFA   PERIPHERAL VASCULAR INTERVENTION  05/02/2019    Procedure: PERIPHERAL VASCULAR INTERVENTION;  Surgeon: Cephus Shelling, MD;  Location: [facility] INVASIVE CV LAB;  Service: Cardiovascular;;  Lt. SFA   RIGHT HEART CATH N/A 06/07/2017   Procedure: RIGHT HEART CATH;  Surgeon: Laurey Morale, MD;  Location: [facility] INVASIVE CV LAB;  Service: Cardiovascular;  Laterality: N/A;   RIGHT HEART CATH N/A 02/08/2018   Procedure: RIGHT HEART CATH;  Surgeon: Laurey Morale, MD;  Location: [facility] INVASIVE CV LAB;  Service: Cardiovascular;  Laterality: N/A;   RIGHT HEART CATH N/A 08/09/2018   Procedure: RIGHT HEART CATH;  Surgeon: Laurey Morale, MD;  Location: [facility] INVASIVE CV LAB;  Service: Cardiovascular;  Laterality: N/A;   RIGHT/LEFT HEART CATH AND CORONARY ANGIOGRAPHY N/A 11/23/2016   Procedure: Right/Left Heart Cath and Coronary Angiography;  Surgeon: Laurey Morale, MD;  Location: [facility] INVASIVE CV LAB;  Service: Cardiovascular;  Laterality: N/A;   TEE WITHOUT CARDIOVERSION N/A 06/12/2017   Procedure: TRANSESOPHAGEAL ECHOCARDIOGRAM (TEE);  Surgeon: Donata Clay, Theron Arista, MD;  Location: [facility] OR;  Service: Open Heart Surgery;  Laterality: N/A;   TEE WITHOUT CARDIOVERSION N/A 02/28/2018   Procedure: TRANSESOPHAGEAL ECHOCARDIOGRAM (TEE);  Surgeon: Donata Clay, Theron Arista, MD;  Location: [facility] OR;  Service: Open Heart Surgery;  Laterality: N/A;   TEMPORARY DIALYSIS CATHETER Left 05/24/2018   Procedure: TEMPORARY DIALYSIS CATHETER;  Surgeon: Cephus Shelling, MD;  Location: [facility] INVASIVE CV LAB;  Service: Cardiovascular;  Laterality: Left;   THROMBECTOMY AND REVISION OF ARTERIOVENTOUS (AV) GORETEX  GRAFT Right 12/12/2017   Procedure: THROMBECTOMY ARTERIOVENTOUS (AV) GORETEX  GRAFT RIGHT UPPER ARM;  Surgeon: Fransisco Hertz, MD;  Location: [facility] OR;  Service: Vascular;  Laterality: Right;   TRANSMETATARSAL AMPUTATION Right 04/30/2019   Procedure: RIGHT TRANSMETATARSAL AMPUTATION;  Surgeon: Maeola Harman, MD;  Location: [facility] OR;  Service: Vascular;  Laterality: Right;     Medications  Prior to Admission  Medication Sig Dispense Refill Last Dose   amiodarone (PACERONE) 400 MG tablet Take 1 tablet (400 mg total) by mouth 2 (two) times daily. Take 400 mg (1 tablet) twice a day x 1 week starting 01/27/23. Then decrease to 400 mg (1 tablet) daily. 35 tablet 6 01/30/2023   aspirin EC 81 MG tablet Take 1 tablet (81 mg total) by mouth daily. 90 tablet 3 01/30/2023   atorvastatin (LIPITOR) 40 MG tablet Take 1 tablet (40 mg total) by mouth daily at 6 PM. (Patient taking differently: Take 40 mg by mouth every evening.) 90 tablet 3 01/30/2023   calcitRIOL (ROCALTROL) 0.5 MCG capsule Take 1 capsule (0.5 mcg total) by mouth every Monday, Wednesday, and Friday. (Patient taking differently: Take 0.5 mcg by mouth See admin instructions. Take  one tablet by mouth on Monday, Wednesdays and Fridays per daughter) 15 capsule 5 01/30/2023   citalopram (CELEXA) 20 MG tablet Take 1 tablet (20 mg total) by mouth daily. 30 tablet 6 01/30/2023   COLACE 100 MG capsule Take 100 mg by mouth daily as needed for moderate constipation.   01/30/2023   fluticasone (FLONASE) 50 MCG/ACT nasal spray Place 1 spray into both nostrils daily. 16 g 11 01/30/2023   hydrALAZINE (APRESOLINE) 25 MG tablet Take 1 tablet three times a day on NON-dialysis days only (Patient taking differently: Take 25 mg by mouth See admin instructions. Take 1 tablet by mouth  three times a day on Tuesday, Thursday, Saturday and Sundays) 270 tablet 3 01/29/2023   insulin glargine (LANTUS) 100 UNIT/ML injection Inject 0.35 mLs (35 Units total) into the skin every morning. (Patient taking differently: Inject 35 Units into the skin at bedtime.) 3 mL 11 01/30/2023   levocetirizine (XYZAL) 5 MG tablet Take 1 tablet (5 mg total) by mouth every evening. 30 tablet 5 01/30/2023   LORazepam (ATIVAN) 0.5 MG tablet Take 1 tablet (0.5 mg total) by mouth every 12 (twelve) hours as needed (nausea). 60 tablet 0 Past Week   metoCLOPramide (REGLAN) 10 MG tablet Take 1 tablet (10  mg total) by mouth 3 (three) times daily. (Patient taking differently: Take 10 mg by mouth 3 (three) times daily before meals.) 90 tablet 6 01/30/2023   midodrine (PROAMATINE) 5 MG tablet Take 2 tablets (10 mg total) by mouth every Monday, Wednesday, and Friday. Take 1 tablet before dialysis treatment 70 tablet 6 01/30/2023   multivitamin (RENA-VIT) TABS tablet Take 1 tablet by mouth daily.   01/30/2023   ondansetron (ZOFRAN-ODT) 4 MG disintegrating tablet Take 4 mg by mouth every 8 (eight) hours as needed for nausea or vomiting.   Past Week   oxyCODONE-acetaminophen (PERCOCET) 7.5-325 MG tablet Take 1 tablet by mouth 3 (three) times daily as needed for pain.   Past Week   pantoprazole (PROTONIX) 40 MG tablet Take 1 tablet (40 mg total) by mouth 2 (two) times daily before lunch and supper. 60 tablet 11 01/30/2023   promethazine (PHENERGAN) 12.5 MG tablet TAKE 1 TABLET (12.5 MG TOTAL) BY MOUTH EVERY 6 (SIX) HOURS AS NEEDED FOR NAUSEA OR VOMITING. 30 tablet 3 Past Week   quiNIDine sulfate 200 MG tablet Take 1 tablet (200 mg total) by mouth 2 (two) times daily. 180 tablet 1 02/03/2023   sevelamer carbonate (RENVELA) 0.8 g PACK packet Take 0.8 g by mouth 3 (three) times daily with meals. 270 each 1 02/03/2023   triamcinolone ointment (KENALOG) 0.1 % Apply 1 Application topically 2 (two) times daily. (Patient taking differently: Apply 1 Application topically daily as needed (For rash).) 30 g 0 02/03/2023   warfarin (COUMADIN) 5 MG tablet Take 1 tablet (5 mg total) by mouth daily. 30 tablet 11 01/30/2023 at 0800   cefadroxil (DURICEF) 500 MG capsule Take 2 capsules (1,000 mg total) by mouth 3 (three) times a week. Take AFTER dialysis sessions (Patient taking differently: Take 1,000 mg by mouth 3 (three) times a week. Take AFTER dialysis sessions on Monday, Wednesday and Fridays only) 24 capsule 11 01/30/2023   quiNIDine sulfate 200 MG tablet Take 1 tablet (200 mg total) by mouth in the morning and at bedtime. (Patient  not taking: Reported on 02/03/2023) 12 tablet 0 Not Taking    Inpatient Medications:   aspirin EC  81 mg Oral Daily   atorvastatin  40 mg  Oral q1800   cefadroxil  1,000 mg Oral Q M,W,F-1800   Chlorhexidine Gluconate Cloth  6 each Topical Q0600   cinacalcet  180 mg Oral Q M,W,F-HD   citalopram  20 mg Oral Daily   doxercalciferol  5 mcg Intravenous Q M,W,F-HD   loratadine  10 mg Oral Daily   LORazepam  0.5 mg Intravenous Once   metoCLOPramide  5 mg Oral TID AC & HS   midodrine  10 mg Oral Q M,W,F   pantoprazole  40 mg Oral BID AC   quiNIDine sulfate  200 mg Oral BID   sevelamer carbonate  0.8 g Oral TID WC   Warfarin - Pharmacist Dosing Inpatient   Does not apply q1600    Allergies:  Allergies  Allergen Reactions   Metformin And Related Diarrhea    Family History  Problem Relation Age of Onset   Colon polyps Mother    Diabetes Mother    Heart attack Father    Diabetes Father    Stroke Paternal Grandfather    Colitis Sister    Heart disease Brother    Colitis Sister    Colon cancer Neg Hx      Physical Exam: Vitals:   02/07/23 0406 02/07/23 0700 02/07/23 0802 02/07/23 1050  BP:  101/61 (!) 107/95   Pulse:  98  70  Resp:  18  16  Temp:  97.8 F (36.6 C)  97.6 F (36.4 C)  TempSrc:  Oral  Oral  SpO2:  98%  99%  Weight: 75.9 kg       GEN- Chronically ill appearing. A&O x 3, Flat but appropriate affect HEENT: Normocephalic, atraumatic Lungs- CTAB, Normal effort.  Heart- LVAD hum.  GI- Soft, NT, ND.  Extremities- No clubbing, cyanosis, or edema   Radiology/Studies: ECHOCARDIOGRAM LIMITED  Result Date: 02/03/2023    ECHOCARDIOGRAM LIMITED REPORT   Patient Name:   [name] Date of Exam: 02/03/2023 Medical Rec #:  [id number]          Height:       68.0 in Accession #:    [id number]         Weight:       181.0 lb Date of Birth:  1965/11/04          BSA:          1.959 m Patient Age:    57 years           BP:           0/0 mmHg Patient Gender: M                   HR:           68 bpm. Exam Location:  Inpatient Procedure: Limited Echo, Color Doppler and Cardiac Doppler Indications:    LVAD Evaluation  History:        Patient has prior history of Echocardiogram examinations, most                 recent 01/24/2023. CHF, CAD; Risk Factors:Hypertension, Diabetes                 and Dyslipidemia.  Sonographer:    Irving Burton Senior RDCS Referring Phys: [phone] Roxy Horseman SIMMONS  Sonographer Comments: Heart Mate III, ramp performed 10 days prior IMPRESSIONS  1. Left ventricular ejection fraction, by estimation, is <20%. The left ventricle has severely decreased function. The left ventricle demonstrates global hypokinesis. There is the interventricular septum is  flattened in systole and diastole, consistent with right ventricular pressure and volume overload.  2. Right ventricular systolic function is severely reduced. The right ventricular size is moderately enlarged. There is normal pulmonary artery systolic pressure.  3. No evidence of mitral valve regurgitation.  4. Aortic valve regurgitation is moderate.  5. The inferior vena cava is dilated in size with <50% respiratory variability, suggesting right atrial pressure of 15 mmHg. FINDINGS  Left Ventricle: HM III LVAD present. Flow 2.7, PI 10.2, Power 4.0, Speed 5300 rpm. Left ventricular ejection fraction, by estimation, is <20%. The left ventricle has severely decreased function. The left ventricle demonstrates global hypokinesis. The interventricular septum is flattened in systole and diastole, consistent with right ventricular pressure and volume overload. Right Ventricle: RVOT VTI 8.6cm. The right ventricular size is moderately enlarged. Right ventricular systolic function is severely reduced. There is normal pulmonary artery systolic pressure. The tricuspid regurgitant velocity is 1.95 m/s, and with an assumed right atrial pressure of 15 mmHg, the estimated right ventricular systolic pressure is 30.2 mmHg. Tricuspid Valve:  Tricuspid valve regurgitation is mild. Aortic Valve: Aortic Valve opens 0/5. Aortic valve regurgitation is moderate. Venous: The inferior vena cava is dilated in size with less than 50% respiratory variability, suggesting right atrial pressure of 15 mmHg. Additional Comments: A device lead is visualized. Spectral Doppler performed. Color Doppler performed.  LEFT VENTRICLE PLAX 2D LVIDs:         4.70 cm  RIGHT VENTRICLE            IVC RV S prime:     3.08 cm/s  IVC diam: 2.30 cm TAPSE (M-mode): 0.7 cm TRICUSPID VALVE TR Peak grad:   15.2 mmHg TR Vmax:        195.00 cm/s Chilton Si MD Electronically signed by Chilton Si MD Signature Date/Time: 02/03/2023/11:01:47 AM    Final    CT CHEST ABDOMEN PELVIS WO CONTRAST  Result Date: 02/03/2023 CLINICAL DATA:  Respiratory illness, nondiagnostic x-ray. EXAM: CT CHEST, ABDOMEN AND PELVIS WITHOUT CONTRAST TECHNIQUE: Multidetector CT imaging of the chest, abdomen and pelvis was performed following the standard protocol without IV contrast. RADIATION DOSE REDUCTION: This exam was performed according to the departmental dose-optimization program which includes automated exposure control, adjustment of the mA and/or kV according to patient size and/or use of iterative reconstruction technique. COMPARISON:  One view chest x-ray 02/03/2023. CT chest, abdomen and pelvis without contrast 11/01/2022. CT chest with contrast 02/28/2018. FINDINGS: CT CHEST FINDINGS Cardiovascular: The heart is enlarged. A left ventricular assist device is again noted. Dense coronary artery calcifications are present. Extensive atherosclerotic calcifications are also present at the aortic arch great vessel origins. No aneurysm is present. Pulmonary arteries are unremarkable. Left chest ICD is again noted. Mediastinum/Nodes: Small nodes are stable. The esophagus is mildly distended proximally without any mass lesion visualized. No significant interval change is present. Lungs/Pleura: A 5 mm  nodule in the left upper lobe is stable since the February exam. It has increased proximally 1 cm in average axial dimension since 2019. The superior segment right lower lobe nodule is obscured by airspace disease. No other focal nodule is present. Moderate dependent airspace disease is present bilaterally. No significant pleural effusions are present. The airways are patent. Musculoskeletal: Median sternotomy again noted. No chest wall mass or suspicious bone lesion is present. CT ABDOMEN PELVIS FINDINGS Hepatobiliary: No focal liver abnormality is seen. No gallstones, gallbladder wall thickening, or biliary dilatation. Pancreas: Scattered calcifications are again noted. No discrete mass lesion  is present. Spleen: Normal in size without focal abnormality. Adrenals/Urinary Tract: Adrenal glands are normal bilaterally. Scattered parenchymal calcifications are stable. Some of these calcifications are vascular. The ureters are within normal limits. Diffuse bladder wall thickening is increased since the prior exam. Some inflammatory changes are present about the urinary bladder. Stomach/Bowel: The stomach and duodenum within normal limits. Small bowel is unremarkable. Partial right hemicolectomy noted. The anastomosis is intact. Transverse colon is unremarkable. The descending and sigmoid colon are within normal limits. Vascular/Lymphatic: Extensive atherosclerotic calcifications are again noted. No aneurysm is present. No significant adenopathy is present. Reproductive: Prostate is unremarkable. The seminal vesicles are calcified. Other: A small paraumbilical hernia contains fat without bowel. No other significant ventral hernias are present. The left ventricular assist device drive line is again noted. No free fluid or free air is present. Musculoskeletal: 2 vertebral body heights and alignment are normal. No acute or focal osseous abnormality is present. The hips are located and normal. IMPRESSION: 1. Diffuse bladder  wall thickening with some inflammatory changes about the urinary bladder. This is concerning for cystitis. 2. Stable 5 mm nodule in the left upper lobe since the February exam. It has increased proximally 1 cm in average axial dimension since 2019. No follow-up needed if patient is low-risk (and has no known or suspected primary neoplasm). Non-contrast chest CT can be considered in 12 months if patient is high-risk. This recommendation follows the consensus statement: Guidelines for Management of Incidental Pulmonary Nodules Detected on CT Images: From the Fleischner Society 2017; Radiology 2017; 284:228-243. 3. The superior segment nodule in the right lower lobe nodule is obscured by airspace disease. 4. Cardiomegaly with left ventricular assist device and left chest ICD. 5. Coronary artery disease. 6. Small paraumbilical hernia contains fat without bowel. 7.  Aortic Atherosclerosis (ICD10-I70.0). Electronically Signed   By: Marin Roberts M.D.   On: 02/03/2023 10:49   DG Chest Port 1 View  Result Date: 02/03/2023 CLINICAL DATA:  Cough. EXAM: PORTABLE CHEST 1 VIEW COMPARISON:  November 08, 2022. FINDINGS: Stable cardiomediastinal silhouette. Left ventricular assist device is unchanged. Left internal jugular dialysis catheter is unchanged. Left-sided defibrillator is unchanged. Mild bibasilar subsegmental atelectasis is noted. Bony thorax is unremarkable. IMPRESSION: Stable support apparatus including left ventricular assist device. Mild bibasilar subsegmental atelectasis. Electronically Signed   By: Lupita Raider M.D.   On: 02/03/2023 09:28   CT Head Wo Contrast  Result Date: 02/03/2023 CLINICAL DATA:  Mental status change, unknown cause EXAM: CT HEAD WITHOUT CONTRAST TECHNIQUE: Contiguous axial images were obtained from the base of the skull through the vertex without intravenous contrast. RADIATION DOSE REDUCTION: This exam was performed according to the departmental dose-optimization program which  includes automated exposure control, adjustment of the mA and/or kV according to patient size and/or use of iterative reconstruction technique. COMPARISON:  CT head July 20, 2021. FINDINGS: Brain: No evidence of acute infarction, hemorrhage, hydrocephalus, extra-axial collection or mass lesion/mass effect. Remote left cerebellar infarcts. Patchy white matter hypodensities, nonspecific but compatible with chronic microvascular ischemic change. Vascular: No hyperdense vessel identified. Skull: No acute fracture. Sinuses/Orbits: Largely clear sinuses.  No acute orbital findings. Other: No mastoid effusions. IMPRESSION: No evidence of acute intracranial abnormality. Electronically Signed   By: Feliberto Harts M.D.   On: 02/03/2023 09:16   ECHOCARDIOGRAM LIMITED  Result Date: 01/24/2023    ECHOCARDIOGRAM LIMITED REPORT   Patient Name:   [name] [facility] Date of Exam: 01/24/2023 Medical Rec #:  [id number]  Height:       68.0 in Accession #:    [id number]         Weight:       170.9 lb Date of Birth:  04-Sep-1966          BSA:          1.912 m Patient Age:    57 years           BP:           0/0 mmHg Patient Gender: M                  HR:           67 bpm. Exam Location:  Outpatient Procedure: Limited Echo, Limited Color Doppler and Cardiac Doppler Indications:    Congestive Heart Failure I50.9  History:        Patient has prior history of Echocardiogram examinations, most                 recent 12/01/2022. Idiopathic CMP and CHF, CAD, Defibrillator,                 Signs/Symptoms:Dyspnea; Risk Factors:Diabetes, Hypertension,                 Dyslipidemia and GERD.  Sonographer:    Eulah Pont RDCS Referring Phys: [id number] [place] IMPRESSIONS  1. LVAD ramp study. Speed 5400 rpm. Septum L shift. LVIDD 4.5 cm. AoV closed with mild AI. No MR, mild TR. Speed increased to 5500 rpm without change and then to 5300 without change in findings. Left ventricular ejection fraction, by estimation, is <20%. The  left ventricle has severely decreased function. The left ventricle demonstrates global hypokinesis. Left ventricular diastolic function could not be evaluated. There is the interventricular septum is flattened in systole and diastole, consistent with right ventricular pressure and volume overload.  2. Right ventricular systolic function is moderately reduced. The right ventricular size is moderately enlarged.  3. The mitral valve is grossly normal. No evidence of mitral valve regurgitation.  4. AoV is closed. The aortic valve is tricuspid. Aortic valve regurgitation is mild. No aortic stenosis is present. FINDINGS  Left Ventricle: LVAD ramp study. Speed 5400 rpm. Septum L shift. LVIDD 4.5 cm. AoV closed with mild AI. No MR, mild TR. Speed increased to 5500 rpm without change and then to 5300 without change in findings. Left ventricular ejection fraction, by estimation, is <20%. The left ventricle has severely decreased function. The left ventricle demonstrates global hypokinesis. The interventricular septum is flattened in systole and diastole, consistent with right ventricular pressure and volume overload.  Left ventricular diastolic function could not be evaluated. Left ventricular diastolic function could not be evaluated due to nondiagnostic images. Right Ventricle: The right ventricular size is moderately enlarged. No increase in right ventricular wall thickness. Right ventricular systolic function is moderately reduced. Pericardium: There is no evidence of pericardial effusion. Mitral Valve: The mitral valve is grossly normal. Tricuspid Valve: The tricuspid valve is grossly normal. Tricuspid valve regurgitation is mild . No evidence of tricuspid stenosis. Aortic Valve: AoV is closed. The aortic valve is tricuspid. Aortic valve regurgitation is mild. No aortic stenosis is present. Pulmonic Valve: The pulmonic valve was grossly normal. Pulmonic valve regurgitation is trivial. No evidence of pulmonic stenosis.  Additional Comments: A device lead is visualized in the right atrium and right ventricle. Spectral Doppler performed. Color Doppler performed.  PULMONIC VALVE RVOT Peak grad: 1 mmHg  SHUNTS Pulmonic VTI: 0.088 m Lennie Odor MD Electronically signed by Lennie Odor MD Signature Date/Time: 01/24/2023/11:09:32 AM    Final     ZOX:WRUE am shows WCT at ~103 bpm (personally reviewed) Confirmed VT by device interrogation  TELEMETRY: AS-VP 70s overnight with intermittent WCT/VT ~ 100 bpm (personally reviewed)  DEVICE HISTORY:  St. Jude Dual Chamber ICD implanted 12/2008, gen change 2018 for CHF History of appropriate therapy: Yes History of AAD therapy: Yes; currently on amiodarone and mexitil  Assessment/Plan: VT, refractory Despite oral therapy with amiodarone and quindine Reloading amiodarone via IV [name] discuss quinidine with MDs. Has certainly slowed his VT and is on a lower dose.  Consider INCREASING quinidine to either 200 mg q 8 hrs or 400 mg BID.  Potassium4.1 (06/04 0040) Magnesium  1.8 (06/04 0040) Creatinine, ser  5.76* (06/04 0040) Keep K > 4.0 and Mg > 2.0  His recurrent VT and multiple severe co morbidities portend a poor prognosis We have adjusted his ATP schemes to be very aggressive previously, though now his VT this am was below detection. We had planned an attempt to pace out, but his VT self terminated.    HFrEF. , HMIII, ICM  St Jude ICD.  NSTEMI in March 2018 with DES to LAD and RCA, complicated by cardiogenic shock, low output requiring milrinone.  Echo 8/18 with EF 20-25%, moderate  He is s/p HM3 LVAD placement. He had pump thrombosis 6/19 due to [facility] outflow graft kinking.  He had pump exchange for another HM3.  HF/LVAD team admitting.  On midodrine 10 mg tid.  On coumadin.   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 M-W-F  Nephrology following.    Diabetic Gastroparesis Additionally complicates his medical regimen Continue reglan  10 mg tid/ac, now off Marinol.  Continue Protonix bid.  Also gets Zofran.   GOC Unfortunately, has poor options for escalation of AAD as he already suffers from significant N/V with his gastroparesis.  Not ablation candidate due to potential epicardial focus and LVAD in-situ.  Would recommend palliative care conversations.   For questions or updates, please contact [facility] Please consult [email or web address] for contact info under Cardiology/STEMI.  Signed, [name] Freer, PA-C  02/07/2023 11:25 AM   I have seen and examined this patient with [name].  Agree with above, note added to reflect my findings.  Patient with a history as above.  He presented to the hospital with ongoing lethargy.  He was found to be in ventricular tachycardia with heart rate around 100 bpm.  He had a previous admission to the hospital for ventricular tachycardia and was started on quinidine in addition to his amiodarone.  He feels somewhat lethargic.  Throughout his hospitalization, he has been converting in and out of VT.  GEN: Well nourished, well developed, in no acute distress  HEENT: normal  Neck: no JVD, carotid bruits, or masses Cardiac: RRR; no murmurs, rubs, or gallops,no edema  Respiratory:  clear to auscultation bilaterally, normal work of breathing GI: soft, nontender, nondistended, + BS MS: no deformity or atrophy  Skin: warm and dry, device site well healed Neuro:  Strength and sensation are intact Psych: euthymic mood, full affect   VT storm: Has continued to have episodes of VT.  Currently on IV amiodarone.  Nichole Neyer increase quinidine to 200 mg every 8 hours.  Fortunately quinidine has slowed his VT.  No device changes at this time. Chronic systolic heart failure: Due  to ischemic cardiomyopathy.  LVAD in place.  Plan for heart failure team. End-stage renal disease: Currently on dialysis  Elise Knobloch M. Jadea Shiffer MD 02/07/2023 2:39 PM

## 2023-02-07 NOTE — Progress Notes (Addendum)
LVAD Coordinator Rounding Note:  Admitted 02/04/23 due to AMS, hypotension, and hypoglycemia.   HM 3 LVAD implanted on 03/01/18 by Dr Donata Clay under destination therapy criteria.  CT head 02/03/23: No evidence of acute intracranial abnormality.   CT chest/abd/pelvis 02/03/23: 1. Diffuse bladder wall thickening with some inflammatory changes about the urinary bladder. This is concerning for cystitis. 2. Stable 5 mm nodule in the left upper lobe since the February exam. It has increased proximally 1 cm in average axial dimension since 2019. No follow-up needed if patient is low-risk (and has no known or suspected primary neoplasm). Non-contrast chest CT can be considered in 12 months if patient is high-risk. This recommendation follows the consensus statement: Guidelines for Management of Incidental Pulmonary Nodules Detected on CT Images: From the Fleischner Society 2017; Radiology 2017; 284:228-243. 3. The superior segment nodule in the right lower lobe nodule is obscured by airspace disease. 4. Cardiomegaly with left ventricular assist device and left chest ICD. 5. Coronary artery disease. 6. Small paraumbilical hernia contains fat without bowel. 7.  Aortic Atherosclerosis (ICD10-I70.0)   Pt laying in bed on my arrival. Tonye Becket NP and [name] PA at bedside. Pt in slow VT rate 100s. Pt is drowsy and nauseated. Will plan for cardioversion this afternoon. Plan to start IV Amiodarone drip per provider.   Completed course of antibiotics for suspected cystitis. Now on chronic suppressive Cefadroxil.   Updated pt's son [name] on the above plan. He verbalized understanding.   Vital signs: Temp: 97.8 HR: 103 VT Doppler Pressure: 102 Automatic BP: 107/95 (101) O2 Sat: 98% on 3 L [place] Wt: 172.6>167.3 lbs    LVAD interrogation reveals:  Speed: 5300 Flow: 3.2 Power: 3.9 w PI: 6.1  Alarms: none Events: 6 PI events today  Hematocrit: 20- DO NOT TITRATE  Fixed speed: 5300 Low speed  limit: 5000  Drive Line: Dressing clean, dry, and intact. Anchor correctly applied. Weekly dressing changes using daily kit. Next dressing change due 02/10/23 per bedside RN.   Labs:  LDH trend: 134>219  INR trend: 3.3>3.2  Anticoagulation Plan: -INR Goal: 2.0 - 3.0 -ASA Dose: 81 mg daily  Device: -St Jude -Therapies: VF 240          VT 200   Arrythmias: hx of recurrent VT. Started on Quinidine 01/27/23.   Infection:  02/03/23>>blood cultures>> no growth x 3 days  Adverse Events on VAD: -Recent admission for VT. Started on Quinidine.   Plan/Recommendations:  1. Page VAD coordinator for any equipment or drive line issues 2. Weekly drive line dressing changes per bedside RN  Alyce Pagan RN VAD Coordinator  Office: [phone]  24/7 Pager: [phone]

## 2023-02-07 NOTE — Progress Notes (Addendum)
Advanced Heart Failure VAD Team Note  PCP-Cardiologist: None   Subjective:   Nursing reported rhythm change overnight.   Complaining nausea and vominting.   LVAD INTERROGATION:  HeartMate III LVAD:   Flow 3.6 liters/min, speed 5300, power 4, PI 6.4   Objective:    Vital Signs:   Temp:  [97.6 F (36.4 C)-97.9 F (36.6 C)] 97.8 F (36.6 C) (06/04 0700) Pulse Rate:  [60-98] 98 (06/04 0700) Resp:  [10-22] 18 (06/04 0700) BP: (98-150)/(61-118) 107/95 (06/04 0802) SpO2:  [97 %-100 %] 98 % (06/04 0700) Weight:  [75.9 kg-78.3 kg] 75.9 kg (06/04 0406) Last BM Date : 01/26/23 Mean arterial Pressure  80-100s   Intake/Output:   Intake/Output Summary (Last 24 hours) at 02/07/2023 0925 Last data filed at 02/07/2023 0900 Gross per 24 hour  Intake 270 ml  Output 2000 ml  Net -1730 ml     Physical Exam    Physical Exam: GENERAL: Appears weak,  No acute distress. HEENT: normal  NECK: Supple, JVP  6-7  .  2+ bilaterally, no bruits.  No lymphadenopathy or thyromegaly appreciated.   R Tunneledl HD catheter.  CARDIAC:  Mechanical heart sounds with LVAD hum present.  LUNGS: Decreased RLL ABDOMEN:  Soft, round, nontender, positive bowel sounds x4.     LVAD exit site: well-healed and incorporated.  Dressing dry and intact.  No erythema or drainage.  Stabilization device present and accurately applied.  Driveline dressing is being changed daily per sterile technique. EXTREMITIES:  Warm and dry, no cyanosis, clubbing, rash or edema  NEUROLOGIC:  Drowsy but A&Ox3    Telemetry  In slow VT. 100s  EP at bedside.  EKG    No new EKG today  Labs   Basic Metabolic Panel: Recent Labs  Lab 02/03/23 0825 02/03/23 0900 02/03/23 1633 02/04/23 0013 02/05/23 0019 02/05/23 0657 02/06/23 0014 02/07/23 0040  NA 132*   < > 129* 130* 131*  --  129* 131*  K 2.7*   < > 3.2* 3.4* 3.6  --  3.6 4.1  CL 93*  --  91* 91* 94*  --  93* 95*  CO2 27  --  27 25 25   --  25 24  GLUCOSE 63*  --  65* 44*  116*  --  156* 100*  BUN 20  --  21* 24* 16  --  21* 13  CREATININE 6.13*  --  6.59* 7.26* 5.90*  --  7.62* 5.76*  CALCIUM 7.6*  --  7.6* 7.8* 8.2*  --  8.2* 8.4*  MG 1.7  --   --   --   --  1.9  --  1.8  PHOS  --   --  4.2  --   --   --   --   --    < > = values in this interval not displayed.    Liver Function Tests: Recent Labs  Lab 02/03/23 0825  AST 36  ALT 32  ALKPHOS 78  BILITOT 0.8  PROT 5.9*  ALBUMIN 2.7*   No results for input(s): "LIPASE", "AMYLASE" in the last 168 hours. Recent Labs  Lab 02/03/23 1931  AMMONIA 46*    CBC: Recent Labs  Lab 02/03/23 0825 02/03/23 0900 02/04/23 0013 02/05/23 0019 02/06/23 0014 02/07/23 0040  WBC 7.9  --  7.8 6.9 6.5 6.8  NEUTROABS 6.1  --   --   --   --   --   HGB 8.8* 10.2* 9.6* 9.0* 9.2* 9.9*  HCT 26.9* 30.0* 29.7* 29.4* 28.2* 31.3*  MCV 87.9  --  87.6 90.5 89.0 89.2  PLT 127*  --  122* 135* 125* 125*    INR: Recent Labs  Lab 02/03/23 1104 02/04/23 0013 02/05/23 0051 02/06/23 0014 02/07/23 0040  INR 3.4* 2.9* 2.8* 3.3* 3.2*    Other results: EKG:    Imaging   No results found.   Medications:     Scheduled Medications:  amiodarone  400 mg Oral Daily   aspirin EC  81 mg Oral Daily   atorvastatin  40 mg Oral q1800   cefadroxil  1,000 mg Oral Q M,W,F-1800   Chlorhexidine Gluconate Cloth  6 each Topical Q0600   cinacalcet  180 mg Oral Q M,W,F-HD   citalopram  20 mg Oral Daily   doxercalciferol  5 mcg Intravenous Q M,W,F-HD   loratadine  10 mg Oral Daily   midodrine  10 mg Oral Q M,W,F   pantoprazole  40 mg Oral BID AC   quiNIDine sulfate  200 mg Oral BID   sevelamer carbonate  0.8 g Oral TID WC   Warfarin - Pharmacist Dosing Inpatient   Does not apply q1600    Infusions:  promethazine (PHENERGAN) injection (IM or IVPB) Stopped (02/07/23 0856)    PRN Medications: acetaminophen, ondansetron (ZOFRAN) IV, promethazine (PHENERGAN) injection (IM or IVPB), promethazine   Patient Profile   57  y.o. with history of CAD s/p anterior MI in 2010 and NSTEMI in 3/18 with DES to pRCA and mLAD and ischemic cardiomyopathy s/p Heartmate 3 LVAD in 2018 and ESRD on HD, recent admit for refractory VT. He also had hx of ischemic lower extremities s/p bilateral AKAs. Readmitted 5/31 for lethargy and AMS. AHF team primary with VAD.   Assessment/Plan:   1. Lethargy/AMS  - CT scan with diffuse bladder wall thickening consistent with cystitis; otherwise work up unremarkable. Treated with abx. Symptoms possibly secondary to recurrent hypoglycemia also.  -Treated with antibiotics.    2. Recent VT - recent admit for refractory VT, failed multiple ATPs. EP consulted. Device adjusted>>ATP in ED with conversion. Placed on oral amiodarone + quinidine w/ suppression of VT - Back in slow VT today. EP consulted.  - Stop po amio. Start IV amio. Set up for cardioversion today.  - Prolonged episode of VT 6/1 lasting greater than 30 minutes.  No effects on LVAD hemodynamics.  Received amiodarone bolus.     3. HFrEF, s/p HMIII, ICM  St Jude ICD.  NSTEMI in March 2018 with DES to LAD and RCA, complicated by cardiogenic shock, low output requiring milrinone.  Echo 8/18 with EF 20-25%, moderate MR. CPX (8/18) with severe functional impairment due to HF.  He is s/p HM3 LVAD placement. He had pump thrombosis 6/19 due to [facility] outflow graft kinking.  He had pump exchange for another HM3.  LVAD parameters stable but he does have PI events and low flow events at times with HD. He takes midodrine pre-HD, but needs hydralazine on non-HD days.  - Had Ramp Echo 5/21. Speed decreased to 5300 - Volume managed per HD. Appreciated nephrology's assistance  - Continue midodrine 10 mg tid.  - On warfarin. INR 3.2   4. Driveline infection, chronic  - Continue cefadroxil long term   - Blood cx negative x 3 days    5. ESRD  - iHD M-W-F  - nephrology following   6. PAD - S/P bilateral AKA  Consulted EP. Discussed with Dr Shirlee Latch.  Suspect we need to involve Palliative Care for goals of care.   I reviewed the LVAD parameters from today, and compared the results to the patient's prior recorded data.  No programming changes were made.  The LVAD is functioning within specified parameters.  The patient performs LVAD self-test daily.  LVAD interrogation was negative for any significant power changes, alarms or PI events/speed drops.  LVAD equipment check completed and is in good working order.  Back-up equipment present.   LVAD education done on emergency procedures and precautions and reviewed exit site care.  Length of Stay: 4   02/07/2023, 9:25 AM  VAD Team --- VAD ISSUES ONLY--- Pager [phone] (7am - 7am)  Advanced Heart Failure Team  Pager [phone] (M-F; 7a - 5p)  Please contact [facility] for night-coverage after hours (5p -7a ) and weekends on [email or web address]  Patient seen with NP, agree with the above note.   He went into slow VT today, amiodarone transition to IV and he converted back to NSR.  Currently in NSR.  Feels better out of VT but has had nausea.   General: Well appearing this am. NAD.  HEENT: Normal. Neck: Supple, JVP 7-8 cm. Carotids OK.  Cardiac:  Mechanical heart sounds with LVAD hum present.  Lungs:  CTAB, normal effort.  Abdomen:  NT, ND, no HSM. No bruits or masses. +BS  LVAD exit site: Well-healed and incorporated. Dressing dry and intact. No erythema or drainage. Stabilization device present and accurately applied. Driveline dressing changed daily per sterile technique. Extremities:  S/p bilateral AKA.  Neuro:  Alert & oriented x 3. Cranial nerves grossly intact. Moves all 4 extremities w/o difficulty. Affect pleasant    Back in NSR currently, slow VT earlier. I worry that VT is driven by severe RV failure, unfortunately he has limited options for management.  - Continue amiodarone gtt overnight, back to po tomorrow if no further VT.  - EP to adjust ICD to pace patient out of VT at at lower  rate.  - EP to discuss whether or not to continue quinidine.   Ongoing nausea. Patient has a significant history of diabetic gastroparesis.  - Will add Reglan 10 mg qac/hs (he was on this dose long-term in the past).  - prn phenergan.  - Protonix.   Will need HD tomorrow.   [name] 02/07/2023 10:50 AM

## 2023-02-07 NOTE — TOC Initial Note (Signed)
Transition of Care [facility]) - Initial/Assessment Note    Patient Details  Name: [name] MRN: [id number] Date of Birth: 1966-03-30  Transition of Care [facility]) CM/SW Contact:    Elliot Cousin, RN Phone Number: [phone] 02/07/2023, 9:14 AM  Clinical Narrative:   HF TOC CM spoke to pt at bedside. Pt gave permission to speak to son. CM contacted son, and pt does not have oxygen in the home. Pt currently on oxygen and may need for home. Pt has hospital bed and wheelchair. Discussed [employer] Medicare offers transportation and Medicaid application to assist with [place] [facility] in the home. Explained Medicaid will cover for personal care assistant to assist in the home. Son states he would look into applying for Medicaid. Son provides transportation to appts.                 Expected Discharge Plan: Home/Self Care Barriers to Discharge: Continued Medical Work up   Patient Goals and CMS Choice Patient states their goals for this hospitalization and ongoing recovery are:: wants to be well enough to remain at home CMS Medicare.gov Compare Post Acute Care list provided to:: Patient Represenative (must comment) Choice offered to / list presented to : Adult Children      Expected Discharge Plan and Services   Discharge Planning Services: CM Consult Post Acute Care Choice: Home Health Living arrangements for the past 2 months: Single Family Home                                      Prior Living Arrangements/Services Living arrangements for the past 2 months: Single Family Home Lives with:: Adult Children Patient language and need for interpreter reviewed:: Yes Do you feel safe going back to the place where you live?: Yes      Need for Family Participation in Patient Care: Yes (Comment) Care giver support system in place?: Yes (comment) Current home services: DME (wheelchair, hospital bed, LVAD) Criminal Activity/Legal Involvement Pertinent to Current Situation/Hospitalization: No  - Comment as needed  Activities of Daily Living      Permission Sought/Granted Permission sought to share information with : Case Manager, Family Supports, PCP Permission granted to share information with : Yes, Verbal Permission Granted  Share Information with NAME: Earlie Counts, Wilmore Aberg     Permission granted to share info w Relationship: daughter, son  Permission granted to share info w Contact Information: [phone]  Emotional Assessment Appearance:: Appears stated age Attitude/Demeanor/Rapport: Lethargic Affect (typically observed): Accepting Orientation: : Oriented to Self, Oriented to Place, Oriented to  Time, Oriented to Situation   Psych Involvement: No (comment)  Admission diagnosis:  Altered mental status, unspecified altered mental status type [R41.82] Complication involving left ventricular assist device (LVAD), initial encounter [T82.9XXA] AMS (altered mental status) [R41.82] Patient Active Problem List   Diagnosis Date Noted   AMS (altered mental status) 02/03/2023   Hemodialysis-associated hypotension 02/03/2023   VT (ventricular tachycardia) (HCC) 01/27/2023   Unspecified atrial flutter (HCC) 12/01/2022   Skin lesion of cheek 08/18/2022   Wheelchair dependence 08/06/2022   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HCC) 12/29/2021   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/24/2020   Penile ulcer 09/19/2019   PAD (peripheral artery disease) (HCC) 07/23/2019   Warfarin anticoagulation 04/15/2019   Heart failure (HCC) 05/23/2018   Benign essential HTN    Crohn's disease  with complication (HCC)    Diabetes mellitus type 2 in nonobese (HCC)    Anemia of chronic disease    Sinus tachycardia    Factor V Leiden carrier (HCC) 01/10/2018   History of creation of ostomy (HCC) 01/10/2018   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) 07/10/2017   Chronic systolic CHF (congestive heart failure)  (HCC) 06/07/2017   DNR (do not resuscitate) discussion    CHF exacerbation (HCC) 04/05/2017   Diabetic keto-acidosis (HCC) 04/05/2017   ESRD on dialysis (HCC) 01/17/2017   Non-ST elevation (NSTEMI) myocardial infarction [facility])    CHF (congestive heart failure) (HCC) 09/02/2016   SVT (supraventricular tachycardia) 04/26/2012   Gastroparesis 11/18/2011   Coronary artery disease    Ischemic cardiomyopathy    Essential hypertension    Automatic implantable cardioverter-defibrillator in situ    POLYP, COLON 09/25/2008   DIVERTICULOSIS, COLON 09/25/2008   History of Crohn's disease 09/05/2006   PCP:  Erick Alley, DO Pharmacy:   [facility] [phone] Ginette Otto, [place] - [address] [place] [address] Phone: [phone] Fax: [phone]  [facility] [address] [place] [address] Phone: [phone] Fax: [phone]  [facility] [address] [place] [address] Phone: [phone] Fax: [phone]     Social Determinants of Health (SDOH) Social History: SDOH Screenings   Food Insecurity: No Food Insecurity (12/06/2022)  Housing: Low Risk  (12/06/2022)  Transportation Needs: No Transportation Needs (11/19/2019)  Utilities: Not At Risk (12/06/2022)  Alcohol Screen: Low Risk  (12/06/2022)  Depression (PHQ2-9): Low Risk  (12/06/2022)  Financial Resource Strain: Low Risk  (12/06/2022)  Physical Activity: Inactive (12/06/2022)  Social Connections: Socially Integrated (12/06/2022)  Stress: No Stress Concern Present (12/06/2022)  Tobacco Use: Low Risk  (02/03/2023)   SDOH Interventions:     Readmission Risk Interventions    02/07/2023    9:06 AM  Readmission Risk Prevention Plan  Transportation Screening Complete

## 2023-02-07 NOTE — Progress Notes (Signed)
ANTICOAGULATION CONSULT NOTE - Follow Up Consult  Pharmacy Consult for warfarin Indication: LVAD  Allergies  Allergen Reactions   Metformin And Related Diarrhea    Patient Measurements: Weight: 75.9 kg (167 lb 5.3 oz)  Vital Signs: Temp: 97.6 F (36.4 C) (06/04 1050) Temp Source: Oral (06/04 1050) BP: 107/95 (06/04 0802) Pulse Rate: 70 (06/04 1050)  Labs: Recent Labs    02/05/23 0019 02/05/23 0051 02/06/23 0014 02/07/23 0040  HGB 9.0*  --  9.2* 9.9*  HCT 29.4*  --  28.2* 31.3*  PLT 135*  --  125* 125*  LABPROT  --  29.7* 33.6* 32.9*  INR  --  2.8* 3.3* 3.2*  CREATININE 5.90*  --  7.62* 5.76*     Estimated Creatinine Clearance: 13.5 mL/min (A) (by C-G formula based on SCr of 5.76 mg/dL (H)).   Medical History: Past Medical History:  Diagnosis Date   Angina    ASCVD (arteriosclerotic cardiovascular disease)    , Anterior infarction 2005, LAD diagonal bifurcation intervention 03/2004   Automatic implantable cardiac defibrillator -St. 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 Last admission, amiodarone was increased, quinidine added, and continues on cefadroxil  for chronic suppression. Admit INR 3.4 at above goal.   Last 2 admits he has been requiring less warfarin than PTA No significant medication changes that would affect INR  Hgb 9, PLTc 120-130s  No bleeding noted.  INR 3.2 today   Goal of Therapy:  INR 2-3 Monitor platelets by anticoagulation protocol: Yes   Plan:  Warfarin 0.5 mg today- repeat  Daily CBC and INR    Leota Sauers Pharm.D. CPP, BCPS Clinical Pharmacist [phone] 02/07/2023 12:02 PM

## 2023-02-07 NOTE — Anesthesia Preprocedure Evaluation (Signed)
Anesthesia Evaluation    Reviewed: Allergy & Precautions, Patient's Chart, lab work & pertinent test results  History of Anesthesia Complications Negative for: history of anesthetic complications  Airway        Dental   Pulmonary neg pulmonary ROS          Cardiovascular hypertension, Pt. on medications + CAD, + Past MI, + Cardiac Stents, + Peripheral Vascular Disease, +CHF and + DVT  + dysrhythmias Supra Ventricular Tachycardia and Ventricular Tachycardia + Cardiac Defibrillator + Valvular Problems/Murmurs AI    LVAD   '24 TTE - EF <20%. Global hypokinesis. There is the interventricular septum is flattened in systole and diastole, consistent with right ventricular pressure and volume overload. Right ventricular systolic function is severely reduced. The right ventricular size is moderately enlarged.  Aortic valve regurgitation is moderate.      Neuro/Psych negative neurological ROS  negative psych ROS   GI/Hepatic Neg liver ROS,GERD  ,,  Endo/Other  diabetes, Type 2, Insulin Dependent   Na 131  Renal/GU Dialysis and ESRFRenal disease     Musculoskeletal negative musculoskeletal ROS (+)    Abdominal   Peds  Hematology  (+) Blood dyscrasia, anemia  On coumadin Plt 125k Factor V Leiden carrier    Anesthesia Other Findings   Reproductive/Obstetrics                             Anesthesia Physical Anesthesia Plan  ASA: 4  Anesthesia Plan: MAC   Post-op Pain Management: Minimal or no pain anticipated   Induction:   PONV Risk Score and Plan: 1 and Propofol infusion and Treatment may vary due to age or medical condition  Airway Management Planned: Nasal Cannula and Natural Airway  Additional Equipment: None  Intra-op Plan:   Post-operative Plan:   Informed Consent:   Plan Discussed with: CRNA and Anesthesiologist  Anesthesia Plan Comments:        Anesthesia Quick  Evaluation

## 2023-02-07 NOTE — Progress Notes (Signed)
Patient ID: [name], male   DOB: 07/07/1966, 57 y.o.   MRN: [id number] S: [name] is back in [place] and heart failure team at bedside. O:BP (!) 107/95   Pulse 98   Temp 97.8 F (36.6 C) (Oral)   Resp 18   Wt 75.9 kg   SpO2 98%   BMI 25.44 kg/m   Intake/Output Summary (Last 24 hours) at 02/07/2023 1027 Last data filed at 02/07/2023 0900 Gross per 24 hour  Intake 270 ml  Output 2000 ml  Net -1730 ml   Intake/Output: I/O last 3 completed shifts: In: 290 [P.O.:240; IV Piggyback:50] Out: 2000 [Other:2000]  Intake/Output this shift:  Total I/O In: 270 [P.O.:120; IV Piggyback:150] Out: -  Weight change: 2.5 kg Gen: chronically ill-appearing CVS: mechanical hum present Resp:CTA Abd: +BS, soft, NT/ND Ext: s/p b/l AKA  Recent Labs  Lab 02/03/23 0825 02/03/23 0900 02/03/23 1633 02/04/23 0013 02/05/23 0019 02/06/23 0014 02/07/23 0040  NA 132* 134* 129* 130* 131* 129* 131*  K 2.7* 2.8* 3.2* 3.4* 3.6 3.6 4.1  CL 93*  --  91* 91* 94* 93* 95*  CO2 27  --  27 25 25 25 24   GLUCOSE 63*  --  65* 44* 116* 156* 100*  BUN 20  --  21* 24* 16 21* 13  CREATININE 6.13*  --  6.59* 7.26* 5.90* 7.62* 5.76*  ALBUMIN 2.7*  --   --   --   --   --   --   CALCIUM 7.6*  --  7.6* 7.8* 8.2* 8.2* 8.4*  PHOS  --   --  4.2  --   --   --   --   AST 36  --   --   --   --   --   --   ALT 32  --   --   --   --   --   --    Liver Function Tests: Recent Labs  Lab 02/03/23 0825  AST 36  ALT 32  ALKPHOS 78  BILITOT 0.8  PROT 5.9*  ALBUMIN 2.7*   No results for input(s): "LIPASE", "AMYLASE" in the last 168 hours. Recent Labs  Lab 02/03/23 1931  AMMONIA 46*   CBC: Recent Labs  Lab 02/03/23 0825 02/03/23 0900 02/04/23 0013 02/05/23 0019 02/06/23 0014 02/07/23 0040  WBC 7.9  --  7.8 6.9 6.5 6.8  NEUTROABS 6.1  --   --   --   --   --   HGB 8.8*   < > 9.6* 9.0* 9.2* 9.9*  HCT 26.9*   < > 29.7* 29.4* 28.2* 31.3*  MCV 87.9  --  87.6 90.5 89.0 89.2  PLT 127*  --  122* 135* 125*  125*   < > = values in this interval not displayed.   Cardiac Enzymes: No results for input(s): "CKTOTAL", "CKMB", "CKMBINDEX", "TROPONINI" in the last 168 hours. CBG: Recent Labs  Lab 02/06/23 1551 02/06/23 2002 02/06/23 2345 02/07/23 0403 02/07/23 0722  GLUCAP 104* 103* 103* 101* 112*    Iron Studies: No results for input(s): "IRON", "TIBC", "TRANSFERRIN", "FERRITIN" in the last 72 hours. Studies/Results: No results found.  aspirin EC  81 mg Oral Daily   atorvastatin  40 mg Oral q1800   cefadroxil  1,000 mg Oral Q M,W,F-1800   Chlorhexidine Gluconate Cloth  6 each Topical Q0600   cinacalcet  180 mg Oral Q M,W,F-HD   citalopram  20 mg Oral Daily   doxercalciferol  5 mcg Intravenous Q M,W,F-HD   loratadine  10 mg Oral Daily   midodrine  10 mg Oral Q M,W,F   pantoprazole  40 mg Oral BID AC   quiNIDine sulfate  200 mg Oral BID   sevelamer carbonate  0.8 g Oral TID WC   Warfarin - Pharmacist Dosing Inpatient   Does not apply q1600    BMET    Component Value Date/Time   NA 131 (L) 02/07/2023 0040   NA 144 05/11/2017 0938   K 4.1 02/07/2023 0040   CL 95 (L) 02/07/2023 0040   CO2 24 02/07/2023 0040   GLUCOSE 100 (H) 02/07/2023 0040   BUN 13 02/07/2023 0040   BUN 68 (H) 05/11/2017 0938   CREATININE 5.76 (H) 02/07/2023 0040   CREATININE 1.63 (H) 11/09/2016 1505   CALCIUM 8.4 (L) 02/07/2023 0040   CALCIUM 8.7 06/27/2017 1152   GFRNONAA 11 (L) 02/07/2023 0040   GFRNONAA 48 (L) 11/09/2016 1505   GFRAA 8 (L) 07/30/2019 0500   GFRAA 55 (L) 11/09/2016 1505   CBC    Component Value Date/Time   WBC 6.8 02/07/2023 0040   RBC 3.51 (L) 02/07/2023 0040   HGB 9.9 (L) 02/07/2023 0040   HGB 11.7 (L) 09/07/2016 1457   HCT 31.3 (L) 02/07/2023 0040   HCT 37.5 09/07/2016 1457   PLT 125 (L) 02/07/2023 0040   PLT 279 09/07/2016 1457   MCV 89.2 02/07/2023 0040   MCV 81 09/07/2016 1457   MCH 28.2 02/07/2023 0040   MCHC 31.6 02/07/2023 0040   RDW 16.8 (H) 02/07/2023 0040   RDW  14.9 09/07/2016 1457   LYMPHSABS 0.7 02/03/2023 0825   LYMPHSABS 1.3 09/07/2016 1457   MONOABS 0.6 02/03/2023 0825   EOSABS 0.4 02/03/2023 0825   EOSABS 0.2 09/07/2016 1457   BASOSABS 0.0 02/03/2023 0825   BASOSABS 0.0 09/07/2016 1457    OP HD: MWF [facility]  4h  400/1.5  77kg  2/2 bath  P4  Heparin 7000   [facility]DC - last OP HD today 5/31, had 45 min HD, BP's dropped and pt was rinsed back - low IDWG's - hectorol 5 mcg IV tiw - sensipar 180 mg po tiw - mircera 60 mcg IV q 2 wks, last dose on 5/22, due 6/05       Assessment/ Plan: Hypotension/ gen'd weakness - LVAD patient on HD. Had shortened outpt HD Friday d/t low bp's. In ED w/u neg except echo showed worsening RHF. IVF's were given in ED. BP's since have improved up to low 100s range. Pt feeling better.  Refractory VT - recent admit for persistent VT, rx'd with amiodarone + mexiletine initially w/o success then was transitioned to amio+ quinidine and VT stabilized.  Now in slow VTach and likely to have cardioversion today per Cardiology.  Severe ischemic cardiomyopathy/congestive heart failure with reduced ejection fraction - s/p HeartMate 3 LVAD and ongoing anticoagulation with Coumadin and blood pressure support with midodrine.  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 ESRD: usual HD MWF. Tolerated HD well yesterday with UF of 2 Liters.  Plan for HD tomorrow to keep on outpatient schedule.   Volume:  close to dry wt, min edema UE's, 1kg over. UF 1-2 L next HD.  Anemia: Hb 9.9, next esa due on 6/05. Follow.  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 Cont IV vdra and po sensipar.  Chronic hypotension: cont midodrine 10mg  3x per wk pre HD  Irena Cords, MD [facility]

## 2023-02-08 DIAGNOSIS — I472 Ventricular tachycardia, unspecified: Secondary | ICD-10-CM | POA: Diagnosis not present

## 2023-02-08 LAB — CULTURE, BLOOD (ROUTINE X 2)
Culture: NO GROWTH
Culture: NO GROWTH
Special Requests: ADEQUATE

## 2023-02-08 LAB — COMPREHENSIVE METABOLIC PANEL
ALT: 26 U/L (ref 0–44)
AST: 34 U/L (ref 15–41)
Albumin: 3 g/dL — ABNORMAL LOW (ref 3.5–5.0)
Alkaline Phosphatase: 87 U/L (ref 38–126)
Anion gap: 15 (ref 5–15)
BUN: 24 mg/dL — ABNORMAL HIGH (ref 6–20)
CO2: 24 mmol/L (ref 22–32)
Calcium: 8.4 mg/dL — ABNORMAL LOW (ref 8.9–10.3)
Chloride: 89 mmol/L — ABNORMAL LOW (ref 98–111)
Creatinine, Ser: 8.24 mg/dL — ABNORMAL HIGH (ref 0.61–1.24)
GFR, Estimated: 7 mL/min — ABNORMAL LOW (ref >60)
Glucose, Bld: 110 mg/dL — ABNORMAL HIGH (ref 70–99)
Potassium: 3.7 mmol/L (ref 3.5–5.1)
Sodium: 128 mmol/L — ABNORMAL LOW (ref 135–145)
Total Bilirubin: 0.6 mg/dL (ref 0.3–1.2)
Total Protein: 6.7 g/dL (ref 6.5–8.1)

## 2023-02-08 LAB — GLUCOSE, CAPILLARY
Glucose-Capillary: 112 mg/dL — ABNORMAL HIGH (ref 70–99)
Glucose-Capillary: 112 mg/dL — ABNORMAL HIGH (ref 70–99)
Glucose-Capillary: 111 mg/dL — ABNORMAL HIGH (ref 70–99)
Glucose-Capillary: 66 mg/dL — ABNORMAL LOW (ref 70–99)
Glucose-Capillary: 113 mg/dL — ABNORMAL HIGH (ref 70–99)
Glucose-Capillary: 131 mg/dL — ABNORMAL HIGH (ref 70–99)

## 2023-02-08 LAB — CBC
HCT: 28.5 % — ABNORMAL LOW (ref 39.0–52.0)
Hemoglobin: 9.2 g/dL — ABNORMAL LOW (ref 13.0–17.0)
MCH: 28 pg (ref 26.0–34.0)
MCHC: 32.3 g/dL (ref 30.0–36.0)
MCV: 86.6 fL (ref 80.0–100.0)
Platelets: 143 10*3/uL — ABNORMAL LOW (ref 150–400)
RBC: 3.29 MIL/uL — ABNORMAL LOW (ref 4.22–5.81)
RDW: 16.7 % — ABNORMAL HIGH (ref 11.5–15.5)
WBC: 7.1 10*3/uL (ref 4.0–10.5)
nRBC: 0 % (ref 0.0–0.2)

## 2023-02-08 LAB — LACTATE DEHYDROGENASE: LDH: 139 U/L (ref 98–192)

## 2023-02-08 LAB — PROTIME-INR
INR: 3.4 — ABNORMAL HIGH (ref 0.8–1.2)
Prothrombin Time: 34.3 seconds — ABNORMAL HIGH (ref 11.4–15.2)

## 2023-02-08 LAB — HEPATITIS B SURFACE ANTIGEN: Hepatitis B Surface Ag: NONREACTIVE

## 2023-02-08 LAB — MAGNESIUM: Magnesium: 1.8 mg/dL (ref 1.7–2.4)

## 2023-02-08 MED ORDER — HEPARIN SODIUM (PORCINE) 1000 UNIT/ML IJ SOLN
3400.0000 [IU] | Freq: Once | INTRAMUSCULAR | Status: AC
  Administered 2023-02-08: 3400 [IU]
  Filled 2023-02-08: qty 4

## 2023-02-08 MED ORDER — HEPARIN SODIUM (PORCINE) 1000 UNIT/ML IJ SOLN
2000.0000 [IU] | Freq: Once | INTRAMUSCULAR | Status: AC
  Administered 2023-02-08: 2000 [IU]

## 2023-02-08 MED ORDER — HEPARIN SODIUM (PORCINE) 1000 UNIT/ML IJ SOLN
INTRAMUSCULAR | Status: AC
  Filled 2023-02-08: qty 2

## 2023-02-08 MED ORDER — AMIODARONE HCL IN DEXTROSE 360-4.14 MG/200ML-% IV SOLN: 60.0000 mg/h | INTRAVENOUS | Status: DC

## 2023-02-08 MED ORDER — HEPARIN SODIUM (PORCINE) 1000 UNIT/ML DIALYSIS
5000.0000 [IU] | Freq: Once | INTRAMUSCULAR | Status: AC
  Administered 2023-02-08: 5000 [IU] via INTRAVENOUS_CENTRAL
  Filled 2023-02-08: qty 5

## 2023-02-08 NOTE — Progress Notes (Addendum)
Advanced Heart Failure VAD Team Note  PCP-Cardiologist: None   Subjective:   Paged to bedside to review EKG. Findings discussed with Dr. Shirlee Latch and EP.   No labs this morning, orders placed STAT.   Sitting up in bed. Denies CP, dizziness or SOB.   LVAD INTERROGATION:  HeartMate III LVAD:   Flow 3.6 liters/min, speed 5300, power 4, PI 6.2  Objective:    Vital Signs:   Temp:  [97.3 F (36.3 C)-98.4 F (36.9 C)] 98.3 F (36.8 C) (06/05 0700) Pulse Rate:  [65-70] 65 (06/04 1539) Resp:  [13-17] 13 (06/05 0700) BP: (83-119)/(51-86) 83/51 (06/05 0700) SpO2:  [95 %-99 %] 95 % (06/05 0700) Weight:  [76.7 kg] 76.7 kg (06/05 0450) Last BM Date : 01/26/23 Mean arterial Pressure  80-90s  Intake/Output:   Intake/Output Summary (Last 24 hours) at 02/08/2023 0815 Last data filed at 02/07/2023 1500 Gross per 24 hour  Intake 304.44 ml  Output --  Net 304.44 ml     Physical Exam  General:  chronically ill appearing. No resp difficulty HEENT: Normal Neck: supple. JVP ~7. Carotids 2+ bilat; no bruits. No lymphadenopathy or thyromegaly appreciated. Cor: Mechanical heart sounds with LVAD hum present. Lungs: Clear Abdomen: soft, nontender, nondistended. No hepatosplenomegaly. No bruits or masses. Good bowel sounds. Driveline: C/D/I; securement device intact and driveline incorporated Extremities: no cyanosis, clubbing, rash, bilateral AKA Neuro: alert & orientedx3, cranial nerves grossly intact. moves extremities w/o difficulty. Affect pleasant   Telemetry   slow VT 80s (Personally reviewed)    EKG    A paced, Long QTc 561 81 bpm   Labs   Basic Metabolic Panel: Recent Labs  Lab 02/03/23 0825 02/03/23 0900 02/03/23 1633 02/04/23 0013 02/05/23 0019 02/05/23 0657 02/06/23 0014 02/07/23 0040  NA 132*   < > 129* 130* 131*  --  129* 131*  K 2.7*   < > 3.2* 3.4* 3.6  --  3.6 4.1  CL 93*  --  91* 91* 94*  --  93* 95*  CO2 27  --  27 25 25   --  25 24  GLUCOSE 63*  --  65*  44* 116*  --  156* 100*  BUN 20  --  21* 24* 16  --  21* 13  CREATININE 6.13*  --  6.59* 7.26* 5.90*  --  7.62* 5.76*  CALCIUM 7.6*  --  7.6* 7.8* 8.2*  --  8.2* 8.4*  MG 1.7  --   --   --   --  1.9  --  1.8  PHOS  --   --  4.2  --   --   --   --   --    < > = values in this interval not displayed.    Liver Function Tests: Recent Labs  Lab 02/03/23 0825  AST 36  ALT 32  ALKPHOS 78  BILITOT 0.8  PROT 5.9*  ALBUMIN 2.7*   No results for input(s): "LIPASE", "AMYLASE" in the last 168 hours. Recent Labs  Lab 02/03/23 1931  AMMONIA 46*    CBC: Recent Labs  Lab 02/03/23 0825 02/03/23 0900 02/04/23 0013 02/05/23 0019 02/06/23 0014 02/07/23 0040  WBC 7.9  --  7.8 6.9 6.5 6.8  NEUTROABS 6.1  --   --   --   --   --   HGB 8.8* 10.2* 9.6* 9.0* 9.2* 9.9*  HCT 26.9* 30.0* 29.7* 29.4* 28.2* 31.3*  MCV 87.9  --  87.6 90.5 89.0 89.2  PLT 127*  --  122* 135* 125* 125*    INR: Recent Labs  Lab 02/03/23 1104 02/04/23 0013 02/05/23 0051 02/06/23 0014 02/07/23 0040  INR 3.4* 2.9* 2.8* 3.3* 3.2*    Other results: EKG:    Imaging   No results found.   Medications:     Scheduled Medications:  aspirin EC  81 mg Oral Daily   atorvastatin  40 mg Oral q1800   cefadroxil  1,000 mg Oral Q M,W,F-1800   Chlorhexidine Gluconate Cloth  6 each Topical Q0600   cinacalcet  180 mg Oral Q M,W,F-HD   citalopram  20 mg Oral Daily   doxercalciferol  5 mcg Intravenous Q M,W,F-HD   loratadine  10 mg Oral Daily   metoCLOPramide  5 mg Oral TID AC & HS   midodrine  10 mg Oral Q M,W,F   pantoprazole  40 mg Oral BID AC   quiNIDine sulfate  200 mg Oral Q8H   sevelamer carbonate  0.8 g Oral TID WC   Warfarin - Pharmacist Dosing Inpatient   Does not apply q1600    Infusions:  sodium chloride     amiodarone 30 mg/hr (02/08/23 0403)   promethazine (PHENERGAN) injection (IM or IVPB) 12.5 mg (02/07/23 2214)    PRN Medications: acetaminophen, ondansetron (ZOFRAN) IV, promethazine  (PHENERGAN) injection (IM or IVPB), promethazine   Patient Profile   57 y.o. with history of CAD s/p anterior MI in 2010 and NSTEMI in 3/18 with DES to pRCA and mLAD and ischemic cardiomyopathy s/p Heartmate 3 LVAD in 2018 and ESRD on HD, recent admit for refractory VT. He also had hx of ischemic lower extremities s/p bilateral AKAs. Readmitted 5/31 for lethargy and AMS. AHF team primary with VAD.   Assessment/Plan:   1. Lethargy/AMS  - CT scan with diffuse bladder wall thickening consistent with cystitis; otherwise work up unremarkable. Treated with abx. Symptoms possibly secondary to recurrent hypoglycemia also.  -Treated with antibiotics.    2. VT storm - recent admit for refractory VT, failed multiple ATPs. EP consulted. Device adjusted>>ATP in ED with conversion. Placed on oral amiodarone + quinidine w/ suppression of VT - Back in slow VT today. EP following, plan discussed with team today. Rates controlled. If quinidine were to be stopped he would go back into fast VT - quinidine increased yesterday.  - Continue IV amio.  - Prolonged episode of VT 6/1 lasting greater than 30 minutes.  No effects on LVAD hemodynamics.  Received amiodarone bolus.   - consult palliative care medicine to discuss GOC   3. HFrEF, s/p HMIII, ICM  St Jude ICD.  NSTEMI in March 2018 with DES to LAD and RCA, complicated by cardiogenic shock, low output requiring milrinone.  Echo 8/18 with EF 20-25%, moderate MR. CPX (8/18) with severe functional impairment due to HF.  He is s/p HM3 LVAD placement. He had pump thrombosis 6/19 due to [facility] outflow graft kinking.  He had pump exchange for another HM3.  LVAD parameters stable but he does have PI events and low flow events at times with HD. He takes midodrine pre-HD, but needs hydralazine on non-HD days.  - Had Ramp Echo 5/21. Speed decreased to 5300 - Volume managed per HD. Appreciated nephrology's assistance  - Continue midodrine 10 mg tid.  - On warfarin. INR  pending today  4. Driveline infection, chronic  - Continue cefadroxil long term   - Blood cx negative x 3 days    5. ESRD  -  iHD M-W-F  - nephrology following   6. PAD - S/P bilateral AKA  I reviewed the LVAD parameters from today, and compared the results to the patient's prior recorded data.  No programming changes were made.  The LVAD is functioning within specified parameters.  The patient performs LVAD self-test daily.  LVAD interrogation was negative for any significant power changes, alarms or PI events/speed drops.  LVAD equipment check completed and is in good working order.  Back-up equipment present.   LVAD education done on emergency procedures and precautions and reviewed exit site care.  Length of Stay: [address] AGACNP-BC  02/08/2023, 8:15 AM  VAD Team --- VAD ISSUES ONLY--- Pager [phone] (7am - 7am)  Advanced Heart Failure Team  Pager [phone] (M-F; 7a - 5p)  Please contact [facility] for night-coverage after hours (5p -7a ) and weekends on [email or web address]  Patient seen with NP, agree with the above note.   He is somewhat lethargic this morning but wakes up and talks with me.  Says that his nausea is improving but still present.  Not eating much.  MAP is stable in 80s.    He is A-V paced this morning, no VT.   General: Well appearing this am. NAD.  HEENT: Normal. Neck: Supple, JVP 7-8 cm. Carotids OK.  Cardiac:  Mechanical heart sounds with LVAD hum present.  Lungs:  CTAB, normal effort.  Abdomen:  NT, ND, no HSM. No bruits or masses. +BS  LVAD exit site: Well-healed and incorporated. Dressing dry and intact. No erythema or drainage. Stabilization device present and accurately applied. Driveline dressing changed daily per sterile technique. Extremities:  Warm and dry. No cyanosis, clubbing, rash, or edema. Bilateral AKA.  Neuro:  Mildly lethargic but awakens and oriented x 3. Cranial nerves grossly intact. Moves all 4 extremities w/o difficulty. Affect  pleasant    He remains a-v sequentially paced.  No further slow VT. I worry that VT is driven by severe RV failure, unfortunately he has limited options for management.  - Continue amiodarone gtt overnight, back to po tomorrow per EP.  - Continue quinidine, QT ok per EP.  - Not VT ablation candidate with LVAD and likely epicardial focus.    Ongoing nausea. Patient has a significant history of diabetic gastroparesis.  Improved with treatment.  - Continue Reglan 10 mg qac/hs (he was on this dose long-term in the past).  - prn phenergan.  - Protonix.    Will need HD today.   [name] 02/08/2023 11:02 AM

## 2023-02-08 NOTE — Progress Notes (Signed)
HF/VAD CSW visited patient at bedside to offer supportive counseling and discuss goals of care. Patient sleeping and no family at bedside. CSW will attempt again. Lasandra Beech, LCSW, CCSW-MCS [phone]

## 2023-02-08 NOTE — Progress Notes (Addendum)
Patient Name: [name] Date of Encounter: 02/08/2023  Primary Cardiologist: None Electrophysiologist: Sherryl Manges, MD  Interval Summary   No further VT. Feeling somewhat better.  EKG this am with AV dual pacing and QT that looks OK when corrected for very wide QRS and ICD in place.   Inpatient Medications    Scheduled Meds:  aspirin EC  81 mg Oral Daily   atorvastatin  40 mg Oral q1800   cefadroxil  1,000 mg Oral Q M,W,F-1800   Chlorhexidine Gluconate Cloth  6 each Topical Q0600   cinacalcet  180 mg Oral Q M,W,F-HD   citalopram  20 mg Oral Daily   doxercalciferol  5 mcg Intravenous Q M,W,F-HD   loratadine  10 mg Oral Daily   metoCLOPramide  5 mg Oral TID AC & HS   midodrine  10 mg Oral Q M,W,F   pantoprazole  40 mg Oral BID AC   quiNIDine sulfate  200 mg Oral Q8H   sevelamer carbonate  0.8 g Oral TID WC   Warfarin - Pharmacist Dosing Inpatient   Does not apply q1600   Continuous Infusions:  sodium chloride     amiodarone 30 mg/hr (02/08/23 0403)   promethazine (PHENERGAN) injection (IM or IVPB) 12.5 mg (02/08/23 0819)   PRN Meds: acetaminophen, ondansetron (ZOFRAN) IV, promethazine (PHENERGAN) injection (IM or IVPB), promethazine   Vital Signs    Vitals:   02/07/23 2233 02/08/23 0404 02/08/23 0450 02/08/23 0700  BP: 101/79 115/79  (!) 83/51  Pulse:      Resp: 14   13  Temp: 97.9 F (36.6 C)   98.3 F (36.8 C)  TempSrc: Oral   Oral  SpO2: 95%   95%  Weight:   76.7 kg     Intake/Output Summary (Last 24 hours) at 02/08/2023 0848 Last data filed at 02/07/2023 1500 Gross per 24 hour  Intake 304.44 ml  Output --  Net 304.44 ml   Filed Weights   02/06/23 1203 02/07/23 0406 02/08/23 0450  Weight: 78.3 kg 75.9 kg 76.7 kg    Physical Exam    GEN- The patient is chronically ill appearing, alert and oriented x 3 today.   Lungs- Clear to ausculation bilaterally, normal work of breathing Cardiac- Regular rate and rhythm, no murmurs, rubs or gallops GI-  soft, NT, ND, + BS Extremities- B-AKA  Telemetry    AV dual pacing at 85 (personally reviewed)  Hospital Course    [name] is a 57 y.o. male with a  history of VT with h/o shocks on amiodarone and mexitil, CAD, ICM s/p HM3 LVAD, gastroparesis,  and ESRD who is being seen today for the evaluation of slow VT at the request of Dr. Shirlee Latch.   Assessment & Plan    VT, refractory Despite oral therapy with amiodarone and quindine Reloading amiodarone via IV. Would transition to po tomorrow, 6/6. Continue quinidine 200 mg q 8 hrs.    If he has further slow VT, he is more likely to have off target effects from increased quinidine than any durable benefit.   Would involve palliative care.  Labs pending this am.  Keep K > 4.0 and Mg > 2.0  His recurrent VT and multiple severe co morbidities portend a poor prognosis We have adjusted his ATP schemes to be very aggressive previously. Have not adjusted further this admission with very slow VT.    HFrEF. , HMIII, ICM  St Jude ICD.  NSTEMI in March 2018 with DES  to LAD and RCA, complicated by cardiogenic shock, low output requiring milrinone.  Echo 8/18 with EF 20-25%, moderate  He is s/p HM3 LVAD placement. He had pump thrombosis 6/19 due to [facility] outflow graft kinking.  He had pump exchange for another HM3.  HF/LVAD team admitting.  On midodrine 10 mg tid.  On coumadin.   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 M-W-F  Nephrology following.    Diabetic Gastroparesis Additionally complicates his medical regimen Continue reglan 10 mg tid/ac, now off Marinol.  Continue Protonix bid.  Also gets Zofran.    GOC Unfortunately, has poor options for escalation of AAD as he already suffers from significant N/V with his gastroparesis.  Not ablation candidate due to potential epicardial focus and LVAD in-situ.  Would recommend palliative care conversations.    For questions or updates, please contact [facility] Please consult [email or web address] for contact info under Cardiology/STEMI.  Signed, Graciella Freer, PA-C  02/08/2023, 8:48 AM   I have seen and examined this patient with [name].  Agree with above, note added to reflect my findings.  No further VT overnight.  Patient feeling improved today.  GEN: Well nourished, well developed, in no acute distress  HEENT: normal  Neck: no JVD, carotid bruits, or masses Cardiac: LVAD hum, RRR; no murmurs, rubs, or gallops,no edema  Respiratory:  clear to auscultation bilaterally, normal work of breathing GI: soft, nontender, nondistended, + BS MS: no deformity or atrophy  Skin: warm and dry, device site well healed Neuro:  Strength and sensation are intact Psych: euthymic mood, full affect   VT storm: Currently on IV amiodarone and quinidine.  Fortunately VT has not recurred since yesterday morning.  [name] continue with current management.  [name] switch to p.o. amiodarone with 400 mg twice daily for 2 weeks followed by 200 mg daily. Chronic systolic heart failure: Status post HeartMate 3 LVAD.  Plan for heart failure team. End-stage renal disease: Dialysis per nephrology  [name] M. [name] Larner MD 02/08/2023 1:04 PM

## 2023-02-08 NOTE — Progress Notes (Signed)
Received patient in bed to unit.  Alert and oriented.  Informed consent signed and in chart.   TX duration:3.5  Patient tolerated well.  Transported back to the room  Alert, without acute distress.  Hand-off given to patient's nurse.   Access used: left [facility] Access issues: none  Total UF removed: 2L Medication(s) given: hecotorol   02/08/23 1654  Vitals  Temp 97.7 F (36.5 C)  Temp Source Oral  BP 103/84  MAP (mmHg) 92  BP Location Right Arm  BP Method Automatic  Patient Position (if appropriate) Lying  Pulse Rate 66  Pulse Rate Source Monitor  ECG Heart Rate 68  Resp 13  Oxygen Therapy  SpO2 100 %  O2 Device Nasal Cannula  O2 Flow Rate (L/min) 3 L/min  During Treatment Monitoring  HD Safety Checks Performed Yes  Intra-Hemodialysis Comments Tx completed;Tolerated well  Dialysis Fluid Bolus Normal Saline  Bolus Amount (mL) 300 mL      Dwaine Pringle [facility]

## 2023-02-08 NOTE — Progress Notes (Signed)
Patient ID: [name], male   DOB: [birth year], 57 y.o.   MRN: [id number] S: Feeling a little better this morning and is out of VT.   O:BP (!) 83/51 (BP Location: Right Wrist)   Pulse 65   Temp 98.3 F (36.8 C) (Oral)   Resp 13   Wt 76.7 kg   SpO2 95%   BMI 25.71 kg/m   Intake/Output Summary (Last 24 hours) at 02/08/2023 0911 Last data filed at 02/07/2023 1500 Gross per 24 hour  Intake 154.44 ml  Output --  Net 154.44 ml   Intake/Output: I/O last 3 completed shifts: In: 424.4 [P.O.:120; I.V.:154.4; IV Piggyback:150] Out: -   Intake/Output this shift:  No intake/output data recorded. Weight change: -3.6 kg Gen: NAD CVS: mechanical hum Resp:CTA Abd: +BS, soft. NT/ND Ext: s/p BAKA  Recent Labs  Lab 02/03/23 0825 02/03/23 0900 02/03/23 1633 02/04/23 0013 02/05/23 0019 02/06/23 0014 02/07/23 0040  NA 132* 134* 129* 130* 131* 129* 131*  K 2.7* 2.8* 3.2* 3.4* 3.6 3.6 4.1  CL 93*  --  91* 91* 94* 93* 95*  CO2 27  --  27 25 25 25 24   GLUCOSE 63*  --  65* 44* 116* 156* 100*  BUN 20  --  21* 24* 16 21* 13  CREATININE 6.13*  --  6.59* 7.26* 5.90* 7.62* 5.76*  ALBUMIN 2.7*  --   --   --   --   --   --   CALCIUM 7.6*  --  7.6* 7.8* 8.2* 8.2* 8.4*  PHOS  --   --  4.2  --   --   --   --   AST 36  --   --   --   --   --   --   ALT 32  --   --   --   --   --   --    Liver Function Tests: Recent Labs  Lab 02/03/23 0825  AST 36  ALT 32  ALKPHOS 78  BILITOT 0.8  PROT 5.9*  ALBUMIN 2.7*   No results for input(s): "LIPASE", "AMYLASE" in the last 168 hours. Recent Labs  Lab 02/03/23 1931  AMMONIA 46*   CBC: Recent Labs  Lab 02/03/23 0825 02/03/23 0900 02/04/23 0013 02/05/23 0019 02/06/23 0014 02/07/23 0040  WBC 7.9  --  7.8 6.9 6.5 6.8  NEUTROABS 6.1  --   --   --   --   --   HGB 8.8*   < > 9.6* 9.0* 9.2* 9.9*  HCT 26.9*   < > 29.7* 29.4* 28.2* 31.3*  MCV 87.9  --  87.6 90.5 89.0 89.2  PLT 127*  --  122* 135* 125* 125*   < > = values in this interval  not displayed.   Cardiac Enzymes: No results for input(s): "CKTOTAL", "CKMB", "CKMBINDEX", "TROPONINI" in the last 168 hours. CBG: Recent Labs  Lab 02/07/23 1541 02/07/23 1908 02/07/23 2336 02/08/23 0359 02/08/23 0715  GLUCAP 117* 142* 124* 112* 112*    Iron Studies: No results for input(s): "IRON", "TIBC", "TRANSFERRIN", "FERRITIN" in the last 72 hours. Studies/Results: No results found.  aspirin EC  81 mg Oral Daily   atorvastatin  40 mg Oral q1800   cefadroxil  1,000 mg Oral Q M,W,F-1800   Chlorhexidine Gluconate Cloth  6 each Topical Q0600   cinacalcet  180 mg Oral Q M,W,F-HD   citalopram  20 mg Oral Daily   doxercalciferol  5 mcg Intravenous  Q M,W,F-HD   loratadine  10 mg Oral Daily   metoCLOPramide  5 mg Oral TID AC & HS   midodrine  10 mg Oral Q M,W,F   pantoprazole  40 mg Oral BID AC   quiNIDine sulfate  200 mg Oral Q8H   sevelamer carbonate  0.8 g Oral TID WC   Warfarin - Pharmacist Dosing Inpatient   Does not apply q1600    BMET    Component Value Date/Time   NA 131 (L) 02/07/2023 0040   NA 144 05/11/2017 0938   K 4.1 02/07/2023 0040   CL 95 (L) 02/07/2023 0040   CO2 24 02/07/2023 0040   GLUCOSE 100 (H) 02/07/2023 0040   BUN 13 02/07/2023 0040   BUN 68 (H) 05/11/2017 0938   CREATININE 5.76 (H) 02/07/2023 0040   CREATININE 1.63 (H) 11/09/2016 1505   CALCIUM 8.4 (L) 02/07/2023 0040   CALCIUM 8.7 06/27/2017 1152   GFRNONAA 11 (L) 02/07/2023 0040   GFRNONAA 48 (L) 11/09/2016 1505   GFRAA 8 (L) 07/30/2019 0500   GFRAA 55 (L) 11/09/2016 1505   CBC    Component Value Date/Time   WBC 6.8 02/07/2023 0040   RBC 3.51 (L) 02/07/2023 0040   HGB 9.9 (L) 02/07/2023 0040   HGB 11.7 (L) 09/07/2016 1457   HCT 31.3 (L) 02/07/2023 0040   HCT 37.5 09/07/2016 1457   PLT 125 (L) 02/07/2023 0040   PLT 279 09/07/2016 1457   MCV 89.2 02/07/2023 0040   MCV 81 09/07/2016 1457   MCH 28.2 02/07/2023 0040   MCHC 31.6 02/07/2023 0040   RDW 16.8 (H) 02/07/2023 0040    RDW 14.9 09/07/2016 1457   LYMPHSABS 0.7 02/03/2023 0825   LYMPHSABS 1.3 09/07/2016 1457   MONOABS 0.6 02/03/2023 0825   EOSABS 0.4 02/03/2023 0825   EOSABS 0.2 09/07/2016 1457   BASOSABS 0.0 02/03/2023 0825   BASOSABS 0.0 09/07/2016 1457    OP HD: MWF [facility]  4h  400/1.5  77kg  2/2 bath  P4  Heparin 7000   [facility] - last OP HD today 5/31, had 45 min HD, BP's dropped and pt was rinsed back - low IDWG's - hectorol 5 mcg IV tiw - sensipar 180 mg po tiw - mircera 60 mcg IV q 2 wks, last dose on 5/22, due 6/05       Assessment/ Plan: Hypotension/ gen'd weakness - LVAD patient on HD. Had shortened outpt HD Friday d/t low bp's. In ED w/u neg except echo showed worsening RHF. IVF's were given in ED. BP's since have improved up to low 100s range. Pt feeling better.  Refractory VT - recent admit for persistent VT, rx'd with amiodarone + mexiletine initially w/o success then was transitioned to amio+ quinidine and VT stabilized.  Was in slow VT yesterday now back in paced rhythm.  Keep K>4 and Mg >2. Severe ischemic cardiomyopathy/congestive heart failure with reduced ejection fraction - s/p HeartMate 3 LVAD and ongoing anticoagulation with Coumadin and blood pressure support with midodrine.  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 ESRD: usual HD MWF. Tolerated HD well yesterday with UF of 2 Liters.  Plan for HD today to keep on outpatient schedule.  Will use 3 or 4K bath depending upon K level this morning which is still pending.  Volume:  below edw, Bp on low side, may limit UF.  Anemia: Hb 9.9, next esa due on 6/05. Follow.  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 Cont IV vdra and po sensipar.  Chronic hypotension: cont midodrine 10mg  3x per wk pre HD  Irena Cords, MD [facility]

## 2023-02-08 NOTE — Progress Notes (Addendum)
ANTICOAGULATION CONSULT NOTE - Follow Up Consult  Pharmacy Consult for warfarin Indication: LVAD  Allergies  Allergen Reactions   Metformin And Related Diarrhea    Patient Measurements: Weight: 76.7 kg (169 lb 1.5 oz)  Vital Signs: Temp: 98.3 F (36.8 C) (06/05 1044) Temp Source: Oral (06/05 1044) BP: 94/78 (06/05 1044)  Labs: Recent Labs    02/06/23 0014 02/07/23 0040 02/08/23 0915 02/08/23 0916  HGB 9.2* 9.9* 9.2*  --   HCT 28.2* 31.3* 28.5*  --   PLT 125* 125* 143*  --   LABPROT 33.6* 32.9* 34.3*  --   INR 3.3* 3.2* 3.4*  --   CREATININE 7.62* 5.76*  --  8.24*     Estimated Creatinine Clearance: 9.5 mL/min (A) (by C-G formula based on SCr of 8.24 mg/dL (H)).   Medical History: Past Medical History:  Diagnosis Date   Angina    ASCVD (arteriosclerotic cardiovascular disease)    , Anterior infarction 2005, LAD diagonal bifurcation intervention 03/2004   Automatic implantable cardiac defibrillator -St. 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 Last admission, amiodarone was increased, quinidine added, and continues on cefadroxil for chronic suppression. Admit  INR 3.4 at above goal.    Last 2 admits he has been requiring less warfarin than PTA. Upon review, quinidine can potentially increase warfarin sensitivity (case reports showing mixed impact of interaction). -Hgb 9.2, plt 143, LDH 139. No bleeding noted. INR today remains elevated at 3.4 despite low dose yesterday.   Goal of Therapy:  INR 2-3 Monitor platelets by anticoagulation protocol: Yes   Plan:  Hold warfarin tonight to allow INR to drift down  Daily CBC and INR  Thank you for allowing pharmacy to participate in this patient's care,  Sherron Monday, PharmD, BCCCP Clinical Pharmacist  Phone: [phone] 02/08/2023 11:59 AM  Please check AMION for all [facility] Pharmacy phone numbers After 10:00 PM, call Main Pharmacy [phone]

## 2023-02-08 NOTE — Progress Notes (Signed)
LVAD Coordinator Rounding Note:  Admitted 02/04/23 due to AMS, hypotension, and hypoglycemia.   HM 3 LVAD implanted on 03/01/18 by Dr Donata Clay under destination therapy criteria.  CT head 02/03/23: No evidence of acute intracranial abnormality.   CT chest/abd/pelvis 02/03/23: 1. Diffuse bladder wall thickening with some inflammatory changes about the urinary bladder. This is concerning for cystitis. 2. Stable 5 mm nodule in the left upper lobe since the February exam. It has increased proximally 1 cm in average axial dimension since 2019. No follow-up needed if patient is low-risk (and has no known or suspected primary neoplasm). Non-contrast chest CT can be considered in 12 months if patient is high-risk. This recommendation follows the consensus statement: Guidelines for Management of Incidental Pulmonary Nodules Detected on CT Images: From the Fleischner Society 2017; Radiology 2017; 284:228-243. 3. The superior segment nodule in the right lower lobe nodule is obscured by airspace disease. 4. Cardiomegaly with left ventricular assist device and left chest ICD. 5. Coronary artery disease. 6. Small paraumbilical hernia contains fat without bowel. 7.  Aortic Atherosclerosis (ICD10-I70.0)   Pt asleep in bed on my arrival. Provider placed consult to palliative care to discuss GOC. Plan for HD today.   Completed course of antibiotics for suspected cystitis. Now on chronic suppressive Cefadroxil.   Vital signs: Temp: 98.3 HR: 86 AV paced Doppler Pressure:  Automatic BP: 132/90 (104) O2 Sat: 95% on 3 L [place] Wt: 172.6>167.3>169.1 lbs    LVAD interrogation reveals:  Speed: 5300 Flow: 3.4 Power: 4.0 w PI: 6.7  Alarms: none per controller Events: no screen in room currently  Hematocrit: 20- DO NOT TITRATE  Fixed speed: 5300 Low speed limit: 5000  Drive Line: Dressing clean, dry, and intact. Anchor correctly applied. Weekly dressing changes using daily kit. Next dressing change due 02/10/23  per bedside RN.   Labs:  LDH trend: 134>219>139  INR trend: 3.3>3.2>3.4  Anticoagulation Plan: -INR Goal: 2.0 - 3.0 -ASA Dose: 81 mg daily  Device: -St Jude -Therapies: VF 240          VT 200   Arrythmias: hx of recurrent VT. Started on Quinidine 01/27/23.   Infection:  02/03/23>>blood cultures>> no growth x 4 days  Adverse Events on VAD: -Recent admission for VT. Started on Quinidine.   Plan/Recommendations:  1. Page VAD coordinator for any equipment or drive line issues 2. Weekly drive line dressing changes per bedside RN  Alyce Pagan RN VAD Coordinator  Office: [phone]  24/7 Pager: [phone]

## 2023-02-09 DIAGNOSIS — T829XXA Unspecified complication of cardiac and vascular prosthetic device, implant and graft, initial encounter: Secondary | ICD-10-CM

## 2023-02-09 DIAGNOSIS — Z515 Encounter for palliative care: Secondary | ICD-10-CM | POA: Diagnosis not present

## 2023-02-09 DIAGNOSIS — I472 Ventricular tachycardia, unspecified: Secondary | ICD-10-CM | POA: Diagnosis not present

## 2023-02-09 DIAGNOSIS — R4182 Altered mental status, unspecified: Secondary | ICD-10-CM | POA: Diagnosis not present

## 2023-02-09 LAB — GLUCOSE, CAPILLARY
Glucose-Capillary: 95 mg/dL (ref 70–99)
Glucose-Capillary: 96 mg/dL (ref 70–99)
Glucose-Capillary: 122 mg/dL — ABNORMAL HIGH (ref 70–99)
Glucose-Capillary: 96 mg/dL (ref 70–99)
Glucose-Capillary: 126 mg/dL — ABNORMAL HIGH (ref 70–99)
Glucose-Capillary: 130 mg/dL — ABNORMAL HIGH (ref 70–99)

## 2023-02-09 LAB — BASIC METABOLIC PANEL
Anion gap: 9 (ref 5–15)
BUN: 13 mg/dL (ref 6–20)
CO2: 26 mmol/L (ref 22–32)
Calcium: 8.4 mg/dL — ABNORMAL LOW (ref 8.9–10.3)
Chloride: 96 mmol/L — ABNORMAL LOW (ref 98–111)
Creatinine, Ser: 5.33 mg/dL — ABNORMAL HIGH (ref 0.61–1.24)
GFR, Estimated: 12 mL/min — ABNORMAL LOW (ref >60)
Glucose, Bld: 108 mg/dL — ABNORMAL HIGH (ref 70–99)
Potassium: 3.9 mmol/L (ref 3.5–5.1)
Sodium: 131 mmol/L — ABNORMAL LOW (ref 135–145)

## 2023-02-09 LAB — LACTATE DEHYDROGENASE: LDH: 146 U/L (ref 98–192)

## 2023-02-09 LAB — PROTIME-INR
INR: 3.3 — ABNORMAL HIGH (ref 0.8–1.2)
Prothrombin Time: 34.1 seconds — ABNORMAL HIGH (ref 11.4–15.2)

## 2023-02-09 LAB — HEPATITIS B SURFACE ANTIBODY, QUANTITATIVE: Hep B S AB Quant (Post): 248 m[IU]/mL (ref >9.9)

## 2023-02-09 LAB — MAGNESIUM: Magnesium: 1.8 mg/dL (ref 1.7–2.4)

## 2023-02-09 NOTE — Plan of Care (Signed)
  Problem: Education: Goal: Patient will understand all VAD equipment and how it functions Outcome: Progressing   Problem: Cardiac: Goal: LVAD will function as expected and patient will experience no clinical alarms Outcome: Progressing   Problem: Education: Goal: Knowledge of General Education information will improve Description: Including pain rating scale, medication(s)/side effects and non-pharmacologic comfort measures Outcome: Progressing   Problem: Health Behavior/Discharge Planning: Goal: Ability to manage health-related needs will improve Outcome: Progressing   Problem: Clinical Measurements: Goal: Ability to maintain clinical measurements within normal limits will improve Outcome: Progressing Goal: Will remain free from infection Outcome: Progressing   Problem: Activity: Goal: Risk for activity intolerance will decrease Outcome: Progressing   Problem: Nutrition: Goal: Adequate nutrition will be maintained Outcome: Progressing   Problem: Coping: Goal: Level of anxiety will decrease Outcome: Progressing   Problem: Elimination: Goal: Will not experience complications related to bowel motility Outcome: Progressing Goal: Will not experience complications related to urinary retention Outcome: Progressing   Problem: Pain Managment: Goal: General experience of comfort will improve Outcome: Progressing   Problem: Safety: Goal: Ability to remain free from injury will improve Outcome: Progressing   Problem: Skin Integrity: Goal: Risk for impaired skin integrity will decrease Outcome: Progressing

## 2023-02-09 NOTE — Progress Notes (Signed)
LVAD Coordinator Rounding Note:  Admitted 02/04/23 due to AMS, hypotension, and hypoglycemia.   HM 3 LVAD implanted on 03/01/18 by Dr Donata Clay under destination therapy criteria.  CT head 02/03/23: No evidence of acute intracranial abnormality.   CT chest/abd/pelvis 02/03/23: 1. Diffuse bladder wall thickening with some inflammatory changes about the urinary bladder. This is concerning for cystitis. 2. Stable 5 mm nodule in the left upper lobe since the February exam. It has increased proximally 1 cm in average axial dimension since 2019. No follow-up needed if patient is low-risk (and has no known or suspected primary neoplasm). Non-contrast chest CT can be considered in 12 months if patient is high-risk. This recommendation follows the consensus statement: Guidelines for Management of Incidental Pulmonary Nodules Detected on CT Images: From the Fleischner Society 2017; Radiology 2017; 284:228-243. 3. The superior segment nodule in the right lower lobe nodule is obscured by airspace disease. 4. Cardiomegaly with left ventricular assist device and left chest ICD. 5. Coronary artery disease. 6. Small paraumbilical hernia contains fat without bowel. 7.  Aortic Atherosclerosis (ICD10-I70.0)   Pt sitting up in bed on my arrival. Denies complaints. Recurrent VT yesterday evening. Increased Amiodarone drip last night per Dr Shirlee Latch.  Provider placed consult to palliative care to discuss GOC.   Completed course of antibiotics for suspected cystitis. Now on chronic suppressive Cefadroxil.   Vital signs: Temp: 97.8 HR: 86 AV paced Doppler Pressure: 108 Automatic BP: 127/89 (100) O2 Sat: 96% on 3 L [place] Wt: 172.6>167.3>169.1 lbs    LVAD interrogation reveals:  Speed: 5300 Flow: 3.5 Power: 5.0 w PI: 3.9  Alarms: none  Events: 2 PI events today  Hematocrit: 20- DO NOT TITRATE  Fixed speed: 5300 Low speed limit: 5000  Drive Line: Dressing clean, dry, and intact. Anchor correctly applied.  Weekly dressing changes using daily kit. Next dressing change due 02/10/23 per bedside RN.   Labs:  LDH trend: 134>219>139  INR trend: 3.3>3.2>3.4>3.3  Anticoagulation Plan: -INR Goal: 2.0 - 3.0 -ASA Dose: 81 mg daily  Device: -St Jude -Therapies: VF 240          VT 200   Arrythmias: hx of recurrent VT. Started on Quinidine 01/27/23.   Infection:  02/03/23>>blood cultures>> no growth x 5 days; final  Adverse Events on VAD: -Recent admission for VT. Started on Quinidine.   Plan/Recommendations:  1. Page VAD coordinator for any equipment or drive line issues 2. Weekly drive line dressing changes per bedside RN  Alyce Pagan RN VAD Coordinator  Office: [phone]  24/7 Pager: [phone]

## 2023-02-09 NOTE — Progress Notes (Signed)
       Thank you for this consult. Chart reviewed. Patient has been in dialysis for most of the afternoon. PMT will follow-up tomorrow.     Sherlean Foot, NP-C Palliative Medicine   Please call Palliative Medicine team phone with any questions [phone]. For individual providers please see AMION.   No charge

## 2023-02-09 NOTE — Consult Note (Signed)
Palliative Care Consult Note                                  Date: 02/09/2023   Patient Name: [name]  DOB: 01-22-1966  MRN: [id number]  Age / Sex: 57 y.o., male  PCP: [name] Alley, DO Referring Physician: Laurey Morale, MD  Reason for Consultation: Establishing goals of care  HPI/Patient Profile: 57 y.o. male  with past medical history of  HFrEF secondary to ischemic cardiomyopathy s/p LVAD in 2018, CAD status post MI 2010 and 2018, ESRD on HD, and PAD s/p bilateral AKA.  He presented to the ED on 02/03/2023 with lethargy and altered mental status.  Found to be in VT storm.  Palliative Medicine was consulted for goals of care in the setting of LVAD and recurrent VT.    Subjective:   I have reviewed medical records including progress notes, labs and imaging, and received an update from the RN. I met with patient at bedside to discuss diagnosis, prognosis, GOC, disposition, and options.  Patient is known to PMT from previous hospitalizations, last seen in 2020.  I re-introduced Palliative Medicine as specialized medical care for people living with serious illness. It focuses on providing relief from the symptoms and stress of a serious illness.   We discussed patient's current illness and what it means in the larger context of his ongoing co-morbidities. Current clinical status was reviewed. Natural disease trajectory of chronic illness was discussed.  Created space and opportunity for patient and family to explore thoughts and feelings regarding current medical situation. Values and goals of care were attempted to be elicited.  A discussion was had today regarding advanced directives. Concepts specific to code status, artifical feeding and hydration, continued IV antibiotics and rehospitalization was had.  The MOST form was introduced and discussed. Questions and concerns addressed.    Social History/Functional status: Patient  lives with his daughter, his 2 sons, and his 47-year-old grandson. He is able to use a slide board to transfer from bed to wheelchair.  He is able to bathe himself and dress himself.  He is even able to prepare a meal for himself.  Goals: Medical stabilization and to return home.  Advanced Directives: HCPOA document on file in [facility] naming his daughter [name] as health care agent, and his son [name] ("[name]") as alternate.  GOC Discussion: Discussion had with patient regarding the seriousness of his current medical situation. Reviewed his chronic issues of ESRD, HF with reduced EF, and PAD. Discussed his recurrent issues with VT, and that he has very limited options for escalation of antiarrhythmic drugs (due to N/V secondary to gastroparesis).  We also discussed that he is not a candidate for ablation due to VAD in place.  Patient verbalizes understanding.   Patient speaks to the importance of both quality of life and to family.  He shares that he is content with his current quality of life and wishes to maintain it as long as possible.  For now, he remains open to all offered and available medical interventions to prolong life.  If his condition were to deteriorate in the point he was not able to interact meaningfully with family (and was not expected to recover), then he would not want to continue life-prolonging interventions.  Discussed the importance of continued conversation with patient and his medical providers regarding overall plan of care and treatment options.  Review of Systems  Gastrointestinal:  Positive for nausea.    Objective:   Primary Diagnoses: Present on Admission:  AMS (altered mental status)  Ventricular tachycardia [facility])   Physical Exam Vitals reviewed.  Constitutional:      General: He is not in acute distress.    Comments: Chronically ill-appearing  Cardiovascular:     Comments: Paced rhythm Pulmonary:     Effort: Pulmonary effort  is normal.  Musculoskeletal:     Right Lower Extremity: Right leg is amputated above knee.     Left Lower Extremity: Left leg is amputated above knee.  Neurological:     Mental Status: He is alert and oriented to person, place, and time.  Psychiatric:        Behavior: Behavior normal.     Vital Signs:  BP 127/89 (BP Location: Left Wrist)   Pulse 80   Temp 97.8 F (36.6 C) (Oral)   Resp 15   Wt 83 kg   SpO2 96%   BMI 27.82 kg/m   Palliative Assessment/Data: PPS 40%     Assessment & Plan:   SUMMARY OF RECOMMENDATIONS   Partial code - no CPR Continue current full scope interventions Goal of care is medical stabilization and to return home PMT will continue to follow  Primary Decision Maker: PATIENT  Symptom Management:  Per primary team  Prognosis:  Unable to determine   Thank you for allowing [place] to participate in the care of [name]  MDM - High   Signed by: Sherlean Foot, NP Palliative Medicine Team  Team Phone # [phone]  For individual providers, please see AMION

## 2023-02-09 NOTE — Progress Notes (Addendum)
Patient Name: [name] Date of Encounter: 02/09/2023  Primary Cardiologist: None Electrophysiologist: [name] Manges, MD  Interval Summary   Had recurrent VT yesterday afternoon at HD. Tolerated fairly well, Amiodarone bumped back up to 60 mg/hr.   No new complaints this am.   Inpatient Medications    Scheduled Meds:  aspirin EC  81 mg Oral Daily   atorvastatin  40 mg Oral q1800   cefadroxil  1,000 mg Oral Q M,W,F-1800   Chlorhexidine Gluconate Cloth  6 each Topical Q0600   cinacalcet  180 mg Oral Q M,W,F-HD   citalopram  20 mg Oral Daily   doxercalciferol  5 mcg Intravenous Q M,W,F-HD   loratadine  10 mg Oral Daily   metoCLOPramide  5 mg Oral TID AC & HS   midodrine  10 mg Oral Q M,W,F   pantoprazole  40 mg Oral BID AC   quiNIDine sulfate  200 mg Oral Q8H   sevelamer carbonate  0.8 g Oral TID WC   Warfarin - Pharmacist Dosing Inpatient   Does not apply q1600   Continuous Infusions:  sodium chloride     amiodarone 60 mg/hr (02/09/23 0615)   promethazine (PHENERGAN) injection (IM or IVPB) 12.5 mg (02/08/23 2311)   PRN Meds: acetaminophen, ondansetron (ZOFRAN) IV, promethazine (PHENERGAN) injection (IM or IVPB), promethazine   Vital Signs    Vitals:   02/08/23 1712 02/08/23 2000 02/08/23 2312 02/09/23 0600  BP:  109/82 99/64 (!) 114/90  Pulse:    86  Resp:  14    Temp: 97.6 F (36.4 C) 98.2 F (36.8 C) 98.2 F (36.8 C) 99.5 F (37.5 C)  TempSrc: Oral Oral Oral Oral  SpO2:  98%  98%  Weight: 83 kg       Intake/Output Summary (Last 24 hours) at 02/09/2023 0807 Last data filed at 02/08/2023 1920 Gross per 24 hour  Intake 240 ml  Output 2000 ml  Net -1760 ml   Filed Weights   02/08/23 0450 02/08/23 1255 02/08/23 1712  Weight: 76.7 kg 85 kg 83 kg    Physical Exam    GEN- The patient is well appearing, alert and oriented x 3 today.   Lungs- Clear to ausculation bilaterally, normal work of breathing Cardiac- Regular rate and rhythm, no murmurs, rubs  or gallops GI- soft, NT, ND, + BS Extremities- no clubbing or cyanosis. No edema  Telemetry    AV dual paced 80s currently, Had recurrent VT in the low 100s yesterday afternoon/evening peri-dialysis.  (personally reviewed)  Hospital Course    [name] is a 57 y.o. male with a history of VT with h/o shocks on amiodarone and mexitil, CAD, ICM s/p HM3 LVAD, gastroparesis, and ESRD who is being seen today for the evaluation of slow VT at the request of Dr. Shirlee [name].   Assessment & Plan    VT, refractory Despite oral therapy with amiodarone and quindine Decrease amiodarone back to 30 mg/hr and see how he does today.  Continue quinidine 200 mg q 8 hrs.    If he has further slow VT, he is more likely to have off target effects from increased quinidine than any durable benefit.   Would involve palliative care.  Potassium3.7 (06/05 1610) Magnesium  1.8 (06/06 0017) Creatinine, ser  8.24* (06/05 0916) Keep K > 4.0 and Mg > 2.0  His recurrent VT and multiple severe co morbidities portend a poor prognosis We have adjusted his ATP schemes to be very aggressive previously. Have  not adjusted further this admission with very slow VT.    HFrEF. , HMIII, ICM  St Jude ICD.  NSTEMI in March 2018 with DES to LAD and RCA, complicated by cardiogenic shock, low output requiring milrinone.  Echo 8/18 with EF 20-25%, moderate  He is s/p HM3 LVAD placement. He had pump thrombosis 6/19 due to [facility] outflow graft kinking.  He had pump exchange for another HM3.  HF/LVAD team attending. .  On midodrine 10 mg tid.  On coumadin.    ESRD  IHD. M-W-F  Nephrology following.     GOC No good options for further escalation of AAD  Not ablation candidate due to potential for epicardial focus and LVAD in-situ.  Would recommend palliative care conversations.    For questions or updates, please contact [facility] Please consult [email or web address] for contact info under Cardiology/STEMI.  Signed, [name] Freer, PA-C  02/09/2023, 8:07 AM   I have seen and examined this patient with [name].  Agree with above, note added to reflect my findings.  Patient feeling well today.  Had recurrent VT during dialysis.  Converted without intervention.  GEN: Well nourished, well developed, in no acute distress  HEENT: normal  Neck: no JVD, carotid bruits, or masses Cardiac: LVAD hum, RRR; no murmurs, rubs, or gallops,no edema  Respiratory:  clear to auscultation bilaterally, normal work of breathing GI: soft, nontender, nondistended, + BS MS: no deformity or atrophy  Skin: warm and dry, device site well healed Neuro:  Strength and sensation are intact Psych: euthymic mood, full affect   VT storm: Has had multiple episodes of ventricular tachycardia.  Currently on amiodarone and quinidine.  Would reduce amiodarone to 30 mg/h.  If he has no further episodes of VT, would be reasonable to switch him to p.o. amiodarone tomorrow. Chronic systolic heart failure: Post HeartMate 2 LVAD.  Plan per heart failure cardiology. End-stage renal disease: Currently on dialysis  [name] M. [name] Blakeman MD 02/09/2023 1:54 PM

## 2023-02-09 NOTE — Progress Notes (Signed)
Patient ID: [name], male   DOB: 1966/04/21, 57 y.o.   MRN: [id number] S: Feels better today.  Able to UF 2 liters with HD yesterday.  For possible discharge tomorrow after HD. O:BP (!) 114/90 (BP Location: Left Arm)   Pulse 86   Temp 99.5 F (37.5 C) (Oral)   Resp 14   Wt 83 kg   SpO2 98%   BMI 27.82 kg/m   Intake/Output Summary (Last 24 hours) at 02/09/2023 0957 Last data filed at 02/09/2023 0701 Gross per 24 hour  Intake 2606.85 ml  Output 2000 ml  Net 606.85 ml   Intake/Output: I/O last 3 completed shifts: In: 240 [P.O.:240] Out: 2000 [Other:2000]  Intake/Output this shift:  Total I/O In: 2366.9 [I.V.:2159.9; IV Piggyback:206.9] Out: -  Weight change: 8.3 kg Gen: NAD CVS: mechanical hum Resp: CTA Abd: +BS, soft, NT/ND Ext: s/p B/L AKA  Recent Labs  Lab 02/03/23 0825 02/03/23 0900 02/03/23 1633 02/04/23 0013 02/05/23 0019 02/06/23 0014 02/07/23 0040 02/08/23 0916  NA 132* 134* 129* 130* 131* 129* 131* 128*  K 2.7* 2.8* 3.2* 3.4* 3.6 3.6 4.1 3.7  CL 93*  --  91* 91* 94* 93* 95* 89*  CO2 27  --  27 25 25 25 24 24   GLUCOSE 63*  --  65* 44* 116* 156* 100* 110*  BUN 20  --  21* 24* 16 21* 13 24*  CREATININE 6.13*  --  6.59* 7.26* 5.90* 7.62* 5.76* 8.24*  ALBUMIN 2.7*  --   --   --   --   --   --  3.0*  CALCIUM 7.6*  --  7.6* 7.8* 8.2* 8.2* 8.4* 8.4*  PHOS  --   --  4.2  --   --   --   --   --   AST 36  --   --   --   --   --   --  34  ALT 32  --   --   --   --   --   --  26   Liver Function Tests: Recent Labs  Lab 02/03/23 0825 02/08/23 0916  AST 36 34  ALT 32 26  ALKPHOS 78 87  BILITOT 0.8 0.6  PROT 5.9* 6.7  ALBUMIN 2.7* 3.0*   No results for input(s): "LIPASE", "AMYLASE" in the last 168 hours. Recent Labs  Lab 02/03/23 1931  AMMONIA 46*   CBC: Recent Labs  Lab 02/03/23 0825 02/03/23 0900 02/04/23 0013 02/05/23 0019 02/06/23 0014 02/07/23 0040 02/08/23 0915  WBC 7.9  --  7.8 6.9 6.5 6.8 7.1  NEUTROABS 6.1  --   --   --   --    --   --   HGB 8.8*   < > 9.6* 9.0* 9.2* 9.9* 9.2*  HCT 26.9*   < > 29.7* 29.4* 28.2* 31.3* 28.5*  MCV 87.9  --  87.6 90.5 89.0 89.2 86.6  PLT 127*  --  122* 135* 125* 125* 143*   < > = values in this interval not displayed.   Cardiac Enzymes: No results for input(s): "CKTOTAL", "CKMB", "CKMBINDEX", "TROPONINI" in the last 168 hours. CBG: Recent Labs  Lab 02/08/23 1724 02/08/23 2010 02/08/23 2317 02/09/23 0619 02/09/23 0716  GLUCAP 66* 113* 131* 95 96    Iron Studies: No results for input(s): "IRON", "TIBC", "TRANSFERRIN", "FERRITIN" in the last 72 hours. Studies/Results: No results found.  aspirin EC  81 mg Oral Daily   atorvastatin  40  mg Oral q1800   cefadroxil  1,000 mg Oral Q M,W,F-1800   Chlorhexidine Gluconate Cloth  6 each Topical Q0600   cinacalcet  180 mg Oral Q M,W,F-HD   citalopram  20 mg Oral Daily   doxercalciferol  5 mcg Intravenous Q M,W,F-HD   loratadine  10 mg Oral Daily   metoCLOPramide  5 mg Oral TID AC & HS   midodrine  10 mg Oral Q M,W,F   pantoprazole  40 mg Oral BID AC   quiNIDine sulfate  200 mg Oral Q8H   sevelamer carbonate  0.8 g Oral TID WC   Warfarin - Pharmacist Dosing Inpatient   Does not apply q1600    BMET    Component Value Date/Time   NA 128 (L) 02/08/2023 0916   NA 144 05/11/2017 0938   K 3.7 02/08/2023 0916   CL 89 (L) 02/08/2023 0916   CO2 24 02/08/2023 0916   GLUCOSE 110 (H) 02/08/2023 0916   BUN 24 (H) 02/08/2023 0916   BUN 68 (H) 05/11/2017 0938   CREATININE 8.24 (H) 02/08/2023 0916   CREATININE 1.63 (H) 11/09/2016 1505   CALCIUM 8.4 (L) 02/08/2023 0916   CALCIUM 8.7 06/27/2017 1152   GFRNONAA 7 (L) 02/08/2023 0916   GFRNONAA 48 (L) 11/09/2016 1505   GFRAA 8 (L) 07/30/2019 0500   GFRAA 55 (L) 11/09/2016 1505   CBC    Component Value Date/Time   WBC 7.1 02/08/2023 0915   RBC 3.29 (L) 02/08/2023 0915   HGB 9.2 (L) 02/08/2023 0915   HGB 11.7 (L) 09/07/2016 1457   HCT 28.5 (L) 02/08/2023 0915   HCT 37.5 09/07/2016  1457   PLT 143 (L) 02/08/2023 0915   PLT 279 09/07/2016 1457   MCV 86.6 02/08/2023 0915   MCV 81 09/07/2016 1457   MCH 28.0 02/08/2023 0915   MCHC 32.3 02/08/2023 0915   RDW 16.7 (H) 02/08/2023 0915   RDW 14.9 09/07/2016 1457   LYMPHSABS 0.7 02/03/2023 0825   LYMPHSABS 1.3 09/07/2016 1457   MONOABS 0.6 02/03/2023 0825   EOSABS 0.4 02/03/2023 0825   EOSABS 0.2 09/07/2016 1457   BASOSABS 0.0 02/03/2023 0825   BASOSABS 0.0 09/07/2016 1457    OP HD: MWF [facility]  4h  400/1.5  77kg  2/2 bath  P4  Heparin 7000   [facility] - last OP HD today 5/31, had 45 min HD, BP's dropped and pt was rinsed back - low IDWG's - hectorol 5 mcg IV tiw - sensipar 180 mg po tiw - mircera 60 mcg IV q 2 wks, last dose on 5/22, due 6/05       Assessment/ Plan: Hypotension/ gen'd weakness - LVAD patient on HD. Had shortened outpt HD Friday d/t low bp's. In ED w/u neg except echo showed worsening RHF. IVF's were given in ED. BP's since have improved up to low 100s range. Pt feeling better.  Refractory VT - recent admit for persistent VT, rx'd with amiodarone + mexiletine initially w/o success then was transitioned to amio+ quinidine and VT stabilized.  Was in slow VT yesterday now back in paced rhythm.  Keep K>4 and Mg >2. Severe ischemic cardiomyopathy/congestive heart failure with reduced ejection fraction - s/p HeartMate 3 LVAD and ongoing anticoagulation with Coumadin and blood pressure support with midodrine.  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 ESRD: usual HD MWF. Tolerated HD well yesterday with UF of 2 Liters.  Plan for HD tomorrow to keep  on outpatient schedule.  Will use 3 or 4K bath depending upon K level tomorrow.  Will plan for 1st shift tomorrow since he may be discharged to home afterwards.  Volume:  below edw, Bp on low side, may limit UF.  Anemia: Hb 9.9, next esa due on 6/05. Follow.  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 Cont IV vdra and po  sensipar.  Chronic hypotension: cont midodrine 10mg  3x per wk pre HD  Irena Cords, MD [facility]

## 2023-02-09 NOTE — Progress Notes (Signed)
ANTICOAGULATION CONSULT NOTE - Follow Up Consult  Pharmacy Consult for warfarin Indication: LVAD  Allergies  Allergen Reactions   Metformin And Related Diarrhea    Patient Measurements: Weight: 83 kg (182 lb 15.7 oz)  Vital Signs: Temp: 97.8 F (36.6 C) (06/06 1122) Temp Source: Oral (06/06 1122) BP: 127/89 (06/06 1122) Pulse Rate: 80 (06/06 0750)  Labs: Recent Labs    02/07/23 0040 02/08/23 0915 02/08/23 0916 02/09/23 0017  HGB 9.9* 9.2*  --   --   HCT 31.3* 28.5*  --   --   PLT 125* 143*  --   --   LABPROT 32.9* 34.3*  --  34.1*  INR 3.2* 3.4*  --  3.3*  CREATININE 5.76*  --  8.24* 5.33*     Estimated Creatinine Clearance: 15.9 mL/min (A) (by C-G formula based on SCr of 5.33 mg/dL (H)).   Medical History: Past Medical History:  Diagnosis Date   Angina    ASCVD (arteriosclerotic cardiovascular disease)    , Anterior infarction 2005, LAD diagonal bifurcation intervention 03/2004   Automatic implantable cardiac defibrillator -St. 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 Last admission, amiodarone was increased, quinidine added, and continues on cefadroxil  for chronic suppression. Admit INR 3.4 at above goal.    Last 2 admits he has been requiring less warfarin than PTA. Upon review, quinidine can potentially increase warfarin sensitivity (case reports showing mixed impact of interaction). -Hgb 9 stable, plt 140s stable, LDH 100s stable No bleeding noted. INR today remains elevated at 3.3 - despite low dose yesterday.   Goal of Therapy:  INR 2-3 Monitor platelets by anticoagulation protocol: Yes   Plan:  Hold warfarin tonight to allow INR to drift down  Daily CBC and INR  Leota Sauers Pharm.D. CPP, BCPS Clinical Pharmacist [phone] 02/09/2023 3:17 PM   Please check AMION for all [facility] phone numbers After 10:00 PM, call Main Pharmacy [phone]

## 2023-02-09 NOTE — Progress Notes (Addendum)
Advanced Heart Failure VAD Team Note  PCP-Cardiologist: None   Subjective:    Had refractory VT yesterday while in HD. Amiodarone increased to 60mg /hr.   Feels good this morning no complaints, sitting up in bed on the phone with mom.   LVAD INTERROGATION:  HeartMate III LVAD:   Flow 3.7 liters/min, speed 5300, power 4, PI 4.3  Objective:    Vital Signs:   Temp:  [97.6 F (36.4 C)-99.5 F (37.5 C)] 99.5 F (37.5 C) (06/06 0600) Pulse Rate:  [32-86] 86 (06/06 0600) Resp:  [0-18] 14 (06/05 2000) BP: (81-145)/(61-112) 114/90 (06/06 0600) SpO2:  [94 %-100 %] 98 % (06/06 0600) Weight:  [83 kg-85 kg] 83 kg (06/05 1712) Last BM Date : 02/08/23 Mean arterial Pressure  90s-low 100s  Intake/Output:   Intake/Output Summary (Last 24 hours) at 02/09/2023 0841 Last data filed at 02/08/2023 1920 Gross per 24 hour  Intake 240 ml  Output 2000 ml  Net -1760 ml     Physical Exam  General:  chronically ill appearing. No resp difficulty HEENT: Normal Neck: supple. JVP ~8. Carotids 2+ bilat; no bruits. No lymphadenopathy or thyromegaly appreciated. Cor: Mechanical heart sounds with LVAD hum present. Lungs: Clear, diminished bases Abdomen: soft, nontender, nondistended. No hepatosplenomegaly. No bruits or masses. Good bowel sounds. Driveline: C/D/I; securement device intact and driveline incorporated Extremities: no cyanosis, clubbing, rash, edema. Bilat AKA Neuro: alert & orientedx3, cranial nerves grossly intact. Affect pleasant.  Telemetry   Paced 80s (Personally reviewed)    EKG    AV paced 80s  Labs   Basic Metabolic Panel: Recent Labs  Lab 02/03/23 0825 02/03/23 0900 02/03/23 1633 02/04/23 0013 02/05/23 0019 02/05/23 1610 02/06/23 0014 02/07/23 0040 02/08/23 0915 02/08/23 0916 02/09/23 0017  NA 132*   < > 129* 130* 131*  --  129* 131*  --  128*  --   K 2.7*   < > 3.2* 3.4* 3.6  --  3.6 4.1  --  3.7  --   CL 93*  --  91* 91* 94*  --  93* 95*  --  89*  --   CO2  27  --  27 25 25   --  25 24  --  24  --   GLUCOSE 63*  --  65* 44* 116*  --  156* 100*  --  110*  --   BUN 20  --  21* 24* 16  --  21* 13  --  24*  --   CREATININE 6.13*  --  6.59* 7.26* 5.90*  --  7.62* 5.76*  --  8.24*  --   CALCIUM 7.6*  --  7.6* 7.8* 8.2*  --  8.2* 8.4*  --  8.4*  --   MG 1.7  --   --   --   --  1.9  --  1.8 1.8  --  1.8  PHOS  --   --  4.2  --   --   --   --   --   --   --   --    < > = values in this interval not displayed.    Liver Function Tests: Recent Labs  Lab 02/03/23 0825 02/08/23 0916  AST 36 34  ALT 32 26  ALKPHOS 78 87  BILITOT 0.8 0.6  PROT 5.9* 6.7  ALBUMIN 2.7* 3.0*   No results for input(s): "LIPASE", "AMYLASE" in the last 168 hours. Recent Labs  Lab 02/03/23 1931  AMMONIA 46*  CBC: Recent Labs  Lab 02/03/23 0825 02/03/23 0900 02/04/23 0013 02/05/23 0019 02/06/23 0014 02/07/23 0040 02/08/23 0915  WBC 7.9  --  7.8 6.9 6.5 6.8 7.1  NEUTROABS 6.1  --   --   --   --   --   --   HGB 8.8*   < > 9.6* 9.0* 9.2* 9.9* 9.2*  HCT 26.9*   < > 29.7* 29.4* 28.2* 31.3* 28.5*  MCV 87.9  --  87.6 90.5 89.0 89.2 86.6  PLT 127*  --  122* 135* 125* 125* 143*   < > = values in this interval not displayed.    INR: Recent Labs  Lab 02/05/23 0051 02/06/23 0014 02/07/23 0040 02/08/23 0915 02/09/23 0017  INR 2.8* 3.3* 3.2* 3.4* 3.3*    Other results: EKG:    Imaging   No results found.   Medications:     Scheduled Medications:  aspirin EC  81 mg Oral Daily   atorvastatin  40 mg Oral q1800   cefadroxil  1,000 mg Oral Q M,W,F-1800   Chlorhexidine Gluconate Cloth  6 each Topical Q0600   cinacalcet  180 mg Oral Q M,W,F-HD   citalopram  20 mg Oral Daily   doxercalciferol  5 mcg Intravenous Q M,W,F-HD   loratadine  10 mg Oral Daily   metoCLOPramide  5 mg Oral TID AC & HS   midodrine  10 mg Oral Q M,W,F   pantoprazole  40 mg Oral BID AC   quiNIDine sulfate  200 mg Oral Q8H   sevelamer carbonate  0.8 g Oral TID WC   Warfarin -  Pharmacist Dosing Inpatient   Does not apply q1600    Infusions:  sodium chloride     amiodarone 60 mg/hr (02/09/23 0615)   promethazine (PHENERGAN) injection (IM or IVPB) 12.5 mg (02/08/23 2311)    PRN Medications: acetaminophen, ondansetron (ZOFRAN) IV, promethazine (PHENERGAN) injection (IM or IVPB), promethazine   Patient Profile   57 y.o. with history of CAD s/p anterior MI in 2010 and NSTEMI in 3/18 with DES to pRCA and mLAD and ischemic cardiomyopathy s/p Heartmate 3 LVAD in 2018 and ESRD on HD, recent admit for refractory VT. He also had hx of ischemic lower extremities s/p bilateral AKAs. Readmitted 5/31 for lethargy and AMS. AHF team primary with VAD.   Assessment/Plan:   1. Lethargy/AMS  - CT scan with diffuse bladder wall thickening consistent with cystitis; otherwise work up unremarkable. Symptoms possibly secondary to recurrent hypoglycemia also.  -Treated with antibiotics.  - much improved   2. VT storm - recent admit for refractory VT, failed multiple ATPs. EP consulted. Device adjusted>>ATP in ED with conversion. Placed on oral amiodarone + quinidine w/ suppression of VT - EP following. - quinidine increased 6/4.  - Continue IV amio.  - Prolonged episode of VT 6/1 lasting greater than 30 minutes.  No effects on LVAD hemodynamics.  Received amiodarone bolus.   - refractory VT 6/5 while in HD, amiodarone increased to 60mg /hr. Now back at 30mg /hr.  - consult palliative care medicine to discuss GOC, will f/u today   3. HFrEF, s/p HMIII, ICM  St Jude ICD.  NSTEMI in March 2018 with DES to LAD and RCA, complicated by cardiogenic shock, low output requiring milrinone.  Echo 8/18 with EF 20-25%, moderate MR. CPX (8/18) with severe functional impairment due to HF.  He is s/p HM3 LVAD placement. He had pump thrombosis 6/19 due to [facility] outflow graft kinking.  He had  pump exchange for another HM3.  LVAD parameters stable but he does have PI events and low flow events at times  with HD. He takes midodrine pre-HD, but needs hydralazine on non-HD days.  - Had Ramp Echo 5/21. Speed decreased to 5300 - Volume managed per HD. Appreciated nephrology's assistance  - Continue midodrine 10 mg tid.  - On warfarin. INR 3.3 today  4. Driveline infection, chronic  - Continue cefadroxil long term   - Blood cx negative x 3 days    5. ESRD  - iHD M-W-F  - nephrology following   6. PAD - S/P bilateral AKA  I reviewed the LVAD parameters from today, and compared the results to the patient's prior recorded data.  No programming changes were made.  The LVAD is functioning within specified parameters.  The patient performs LVAD self-test daily.  LVAD interrogation was negative for any significant power changes, alarms or PI events/speed drops.  LVAD equipment check completed and is in good working order.  Back-up equipment present.   LVAD education done on emergency procedures and precautions and reviewed exit site care.  Length of Stay: [address], AGACNP-BC  02/09/2023, 8:41 AM  VAD Team --- VAD ISSUES ONLY--- Pager [phone] (7am - 7am)  Advanced Heart Failure Team  Pager [phone] (M-F; 7a - 5p)  Please contact [facility] for night-coverage after hours (5p -7a ) and weekends on [email or web address]  Patient seen with NP, agree with the above note.   Patient had slow VT with HD yesterday, did not know he was in VT.  Amiodarone increased to 60 mg/hr.   Nausea is significantly improved.   General: Well appearing this am. NAD.  HEENT: Normal. Neck: Supple, JVP 7-8 cm. Carotids OK.  Cardiac:  Mechanical heart sounds with LVAD hum present.  Lungs:  CTAB, normal effort.  Abdomen:  NT, ND, no HSM. No bruits or masses. +BS  LVAD exit site: Well-healed and incorporated. Dressing dry and intact. No erythema or drainage. Stabilization device present and accurately applied. Driveline dressing changed daily per sterile technique. Extremities:  Warm and dry. No cyanosis, clubbing,  rash, or edema. S/p bilateral AKA.  Neuro:  Alert & oriented x 3. Cranial nerves grossly intact. Moves all 4 extremities w/o difficulty. Affect pleasant    He remains a-v sequentially paced.  He had slow VT yesterday at HD that he did not notice. I worry that VT is driven by severe RV failure, unfortunately he has limited options for management. Amiodarone + quinidine has slowed VT when it occurs but not preventing it.  - Continue amiodarone gtt at 60 mg/hr overnight, ?po tomorrow.  Will have HD tomorrow.  If he gets through HD without further VT, would consider discharge over weekend.  - Continue quinidine, QT ok per EP.  - Not VT ablation candidate with LVAD and likely epicardial focus.    Patient has a significant history of diabetic gastroparesis. Had nausea earlier this admission, now improved.   - Continue Reglan 10 mg qac/hs (he was on this dose long-term in the past).  - prn phenergan.  - Protonix.   Abdurraheem Afifi 02/09/2023 12:16 PM

## 2023-02-09 NOTE — Discharge Summary (Cosign Needed Addendum)
Advanced Heart Failure Team  Discharge Summary   Patient ID: [name] MRN: [id number], DOB/AGE: [birth year] 58 y.o. Admit date: 02/03/2023 D/C date:     02/13/2023   Primary Discharge Diagnoses:  VT storm Lethargy/ AMS  Secondary Discharge Diagnoses:  Chronic HFrEF, s/p HM III, ICM Chronic driveline infection ESRD PAD  Hospital Course:  [name] is a 57 y.o. AAM with history of CAD s/p anterior MI in 2010 and NSTEMI in 3/18 with DES to pRCA and mLAD and ischemic cardiomyopathy s/p Heartmate 3 LVAD in 2018 and ESRD on HD, recent admit for refractory VT. He also had hx of ischemic lower extremities s/p bilateral AKAs.   Admitted 5/31 for lethargy and AMS. He was in his HD session when he became hypotensive and nonverbal. Echo showed progressively worsening RV failure. Head CT negative. Hypoglycemic on arrival.  Tolerated HD while admitted, nephrology followed. Did have refractory VT multiple times during this admission, EP consulted and increased his quinidine. He has limited options with progressively worse RV, ESRD and end stage heart failure. Not ablation candidate with potential epicardial focus and LVAD. Palliative care team ultimately consulted to further discuss GOC with limited options. Tolerated HD today, no further VT reoccurrence. Suspect previous VT was triggered by pulling too much fluid in HD.   Plan to check INR 6/13 at home. Will f/u with AHF PharmD.  Pt will continue to be followed closely in the AHF clinic, has f/u scheduled in VAD clinic. Dr. Gala Romney evaluated and deemed appropriate for discharge.   See below for detailed problem list: 1. Lethargy/AMS  - CT scan with diffuse bladder wall thickening consistent with cystitis; otherwise work up unremarkable. Symptoms possibly secondary to recurrent hypoglycemia also.  -Treated with antibiotics.  - resolved 2. VT storm - recent admit for refractory VT, failed multiple ATPs. EP consulted. Device  adjusted>>ATP in ED with conversion. Placed on oral amiodarone + quinidine w/ suppression of VT.  Suspect increased VT is due to worsening RV failure.  - EP following. - quinidine increased 6/4 and again 6/7, now on 400 mg bid.  - Prolonged episode of VT 6/1 lasting greater than 30 minutes.  No effects on LVAD hemodynamics.  Received amiodarone bolus.   - refractory VT 6/5 while in HD, amiodarone increased to 60mg /hr. - VT again at HD 6/7.  - No further VT today - palliative team saw, plan to continue current treatment.  - Would keep dry weight 78 kg, may help with VT avoidance.  - Continue amiodarone 400 mg BID for 7 days followed by 200 mg BID after 3. HFrEF, s/p HMIII, ICM  St Jude ICD.  NSTEMI in March 2018 with DES to LAD and RCA, complicated by cardiogenic shock, low output requiring milrinone.  Echo 8/18 with EF 20-25%, moderate MR. CPX (8/18) with severe functional impairment due to HF.  He is s/p HM3 LVAD placement. He had pump thrombosis 6/19 due to [facility] outflow graft kinking.  He had pump exchange for another HM3.  LVAD parameters stable but he does have PI events and low flow events at times with HD. He takes midodrine pre-HD, but needs hydralazine on non-HD days.  - Had Ramp Echo 5/21. Speed decreased to 5300 - Echo this admission with midline septum, severe RV dysfunction.  - Volume managed per HD. Appreciated nephrology's assistance  - Continue midodrine 10 mg tid.  - On warfarin 4. Driveline infection, chronic  - Continue cefadroxil long term   5. ESRD  -  iHD M-W-F, per nephrology  - As above, would keep dry weight at 78 kg, VT during HD may be triggered by pulling off excess fluid.  6. PAD - S/P bilateral AKA  LVAD Interrogation HM III:  Speed: 5300  Flow: 4.3  PI: 3.3  Power: 4.2  Back-up speed: 5000      Discharge Weight Range: 169 lbs Discharge Vitals: Blood pressure 102/84, pulse 86, temperature 98.1 F (36.7 C), resp. rate 16, weight 77.1 kg, SpO2 98 %.  Labs: Lab  Results  Component Value Date   WBC 13.0 (H) 02/13/2023   HGB 9.4 (L) 02/13/2023   HCT 28.6 (L) 02/13/2023   MCV 84.1 02/13/2023   PLT 136 (L) 02/13/2023    Recent Labs  Lab 02/08/23 0916 02/09/23 0017 02/13/23 0010  NA 128*   < > 131*  K 3.7   < > 4.9  CL 89*   < > 95*  CO2 24   < > 19*  BUN 24*   < > 46*  CREATININE 8.24*   < > 9.62*  CALCIUM 8.4*   < > 9.1  PROT 6.7  --   --   BILITOT 0.6  --   --   ALKPHOS 87  --   --   ALT 26  --   --   AST 34  --   --   GLUCOSE 110*   < > 98   < > = values in this interval not displayed.   Lab Results  Component Value Date   CHOL 128 05/04/2017   HDL 50 05/04/2017   LDLCALC 54 05/04/2017   TRIG 118 05/04/2017   BNP (last 3 results) Recent Labs    02/03/23 0825  BNP 1,200.6*    ProBNP (last 3 results) No results for input(s): "PROBNP" in the last 8760 hours.   Diagnostic Studies/Procedures   ECHOCARDIOGRAM LIMITED  Result Date: 02/10/2023    ECHOCARDIOGRAM REPORT   Patient Name:   [name] [facility] Date of Exam: 02/10/2023 Medical Rec #:  [id number]          Height:       68.0 in Accession #:    [id number]         Weight:       164.2 lb Date of Birth:  May 01, 1966          BSA:          1.880 m Patient Age:    57 years           BP:           0/0 mmHg Patient Gender: M                  HR:           86 bpm. Exam Location:  Inpatient Procedure: Limited Echo, Limited Color Doppler and Cardiac Doppler STAT ECHO Indications:    Ventricular tachycardia  History:        Patient has prior history of Echocardiogram examinations, most                 recent 02/03/2023. CHF, Defibrillator and LVAD, end stage renal                 disease; Risk Factors:Diabetes.  Sonographer:    Delcie Roch RDCS Referring Phys: [phone] Demitri Kucinski L Kerrion Kemppainen  Sonographer Comments: Technically difficult study due to poor echo windows. IMPRESSIONS  1. Left ventricular ejection fraction, by estimation, is <20%.  The left ventricle has severely decreased function. The  left ventricle demonstrates global hypokinesis. The left ventricular internal cavity size was severely dilated. Left ventricular diastolic parameters are indeterminate.  2. Right ventricular systolic function is severely reduced. The right ventricular size is moderately enlarged.  3. The mitral valve is normal in structure. Trivial mitral valve regurgitation.  4. There is severe biventricular dysfunction. The LV is markedly dilated and hypokinetic. LVAD cannula not seen well in apex but appears grossly normal. Interventricular septum pulls slightly to the left on apical images.  5. The aortic valve does not open in setting of LVAD support. The aortic valve is tricuspid. Aortic valve regurgitation is mild. FINDINGS  Left Ventricle: Left ventricular ejection fraction, by estimation, is <20%. The left ventricle has severely decreased function. The left ventricle demonstrates global hypokinesis. The left ventricular internal cavity size was severely dilated. There is no left ventricular hypertrophy. Left ventricular diastolic parameters are indeterminate. Right Ventricle: The right ventricular size is moderately enlarged. No increase in right ventricular wall thickness. Right ventricular systolic function is severely reduced. Left Atrium: Left atrial size was not well visualized. Right Atrium: Right atrial size was not well visualized. Pericardium: There is no evidence of pericardial effusion. Mitral Valve: The mitral valve is normal in structure. Trivial mitral valve regurgitation. Tricuspid Valve: The tricuspid valve is normal in structure. Tricuspid valve regurgitation is trivial. Aortic Valve: The aortic valve does not open in setting of LVAD support. The aortic valve is tricuspid. Aortic valve regurgitation is mild. Pulmonic Valve: The pulmonic valve was not well visualized. Pulmonic valve regurgitation is not visualized. Aorta: The aortic root was not well visualized. IAS/Shunts: No atrial level shunt detected by  color flow Doppler. Additional Comments: A device lead is visualized.  IVC IVC diam: 1.50 cm TRICUSPID VALVE TR Peak grad:   11.2 mmHg TR Vmax:        167.00 cm/s Arvilla Meres MD Electronically signed by Arvilla Meres MD Signature Date/Time: 02/10/2023/3:10:41 PM    Final      Discharge Medications   Allergies as of 02/13/2023       Reactions   Metformin And Related Diarrhea        Medication List     STOP taking these medications    hydrALAZINE 25 MG tablet Commonly known as: APRESOLINE       TAKE these medications    amiodarone 400 MG tablet Commonly known as: PACERONE Take 1 tablet (400 mg total) by mouth 2 (two) times daily. What changed: additional instructions   amiodarone 200 MG tablet Commonly known as: PACERONE Take 1 tablet (200 mg total) by mouth 2 (two) times daily. Start taking on: February 20, 2023 What changed: You were already taking a medication with the same name, and this prescription was added. Make sure you understand how and when to take each.   aspirin EC 81 MG tablet Take 1 tablet (81 mg total) by mouth daily.   atorvastatin 40 MG tablet Commonly known as: LIPITOR Take 1 tablet (40 mg total) by mouth daily at 6 PM. What changed: when to take this   calcitRIOL 0.5 MCG capsule Commonly known as: ROCALTROL Take 1 capsule (0.5 mcg total) by mouth every Monday, Wednesday, and Friday. What changed:  when to take this additional instructions   cefadroxil 500 MG capsule Commonly known as: DURICEF Take 2 capsules (1,000 mg total) by mouth 3 (three) times a week. Take AFTER dialysis sessions What changed: additional instructions   cinacalcet  30 MG tablet Commonly known as: SENSIPAR Take 6 tablets (180 mg total) by mouth every Monday, Wednesday, and Friday with hemodialysis.   citalopram 20 MG tablet Commonly known as: CELEXA Take 1 tablet (20 mg total) by mouth daily.   Colace 100 MG capsule Generic drug: docusate sodium Take 100 mg by  mouth daily as needed for moderate constipation.   fluticasone 50 MCG/ACT nasal spray Commonly known as: FLONASE Place 1 spray into both nostrils daily.   insulin glargine 100 UNIT/ML injection Commonly known as: LANTUS Inject 0.35 mLs (35 Units total) into the skin every morning. What changed: when to take this   levocetirizine 5 MG tablet Commonly known as: XYZAL Take 1 tablet (5 mg total) by mouth every evening.   loratadine 10 MG tablet Commonly known as: CLARITIN Take 1 tablet (10 mg total) by mouth daily. Start taking on: February 14, 2023   LORazepam 0.5 MG tablet Commonly known as: ATIVAN Take 1 tablet (0.5 mg total) by mouth every 12 (twelve) hours as needed (nausea).   metoCLOPramide 5 MG tablet Commonly known as: REGLAN Take 1 tablet (5 mg total) by mouth 4 (four) times daily -  before meals and at bedtime. What changed:  medication strength how much to take when to take this   midodrine 5 MG tablet Commonly known as: PROAMATINE Take 2 tablets (10 mg total) by mouth every Monday, Wednesday, and Friday. Take 1 tablet before dialysis treatment   multivitamin Tabs tablet Take 1 tablet by mouth daily.   ondansetron 4 MG disintegrating tablet Commonly known as: ZOFRAN-ODT Take 4 mg by mouth every 8 (eight) hours as needed for nausea or vomiting.   oxyCODONE-acetaminophen 7.5-325 MG tablet Commonly known as: PERCOCET Take 1 tablet by mouth 3 (three) times daily as needed for pain.   pantoprazole 40 MG tablet Commonly known as: Protonix Take 1 tablet (40 mg total) by mouth 2 (two) times daily before lunch and supper.   promethazine 12.5 MG tablet Commonly known as: PHENERGAN TAKE 1 TABLET (12.5 MG TOTAL) BY MOUTH EVERY 6 (SIX) HOURS AS NEEDED FOR NAUSEA OR VOMITING.   quiNIDine sulfate 200 MG tablet Take 2 tablets (400 mg total) by mouth 2 (two) times daily. What changed:  how much to take Another medication with the same name was removed. Continue taking  this medication, and follow the directions you see here.   sevelamer carbonate 0.8 g Pack packet Commonly known as: RENVELA Take 0.8 g by mouth 3 (three) times daily with meals.   triamcinolone ointment 0.1 % Commonly known as: KENALOG Apply 1 Application topically 2 (two) times daily. What changed:  when to take this reasons to take this   warfarin 5 MG tablet Commonly known as: COUMADIN Take as directed. If you are unsure how to take this medication, talk to your nurse or doctor. Original instructions: Take 0.5 tablets (2.5 mg total) by mouth daily. What changed: how much to take        Disposition   The patient will be discharged in stable condition to home.      Duration of Discharge Encounter: Greater than 35 minutes   Signed, Alen Bleacher AGACNP-BC  02/13/2023, 2:16 PM

## 2023-02-10 ENCOUNTER — Inpatient Hospital Stay (HOSPITAL_COMMUNITY): Payer: Medicare Other

## 2023-02-10 DIAGNOSIS — I5022 Chronic systolic (congestive) heart failure: Secondary | ICD-10-CM

## 2023-02-10 DIAGNOSIS — I472 Ventricular tachycardia, unspecified: Secondary | ICD-10-CM | POA: Diagnosis not present

## 2023-02-10 LAB — BASIC METABOLIC PANEL
Anion gap: 15 (ref 5–15)
BUN: 13 mg/dL (ref 6–20)
CO2: 25 mmol/L (ref 22–32)
Calcium: 8.9 mg/dL (ref 8.9–10.3)
Chloride: 93 mmol/L — ABNORMAL LOW (ref 98–111)
Creatinine, Ser: 4.19 mg/dL — ABNORMAL HIGH (ref 0.61–1.24)
GFR, Estimated: 16 mL/min — ABNORMAL LOW (ref >60)
Glucose, Bld: 157 mg/dL — ABNORMAL HIGH (ref 70–99)
Potassium: 3.7 mmol/L (ref 3.5–5.1)
Sodium: 133 mmol/L — ABNORMAL LOW (ref 135–145)

## 2023-02-10 LAB — GLUCOSE, CAPILLARY
Glucose-Capillary: 102 mg/dL — ABNORMAL HIGH (ref 70–99)
Glucose-Capillary: 132 mg/dL — ABNORMAL HIGH (ref 70–99)
Glucose-Capillary: 146 mg/dL — ABNORMAL HIGH (ref 70–99)
Glucose-Capillary: 168 mg/dL — ABNORMAL HIGH (ref 70–99)

## 2023-02-10 LAB — ECHOCARDIOGRAM LIMITED
Est EF: <20
Weight: 2627.88 oz

## 2023-02-10 LAB — RENAL FUNCTION PANEL
Albumin: 2.9 g/dL — ABNORMAL LOW (ref 3.5–5.0)
Anion gap: 11 (ref 5–15)
BUN: 15 mg/dL (ref 6–20)
CO2: 26 mmol/L (ref 22–32)
Calcium: 8.7 mg/dL — ABNORMAL LOW (ref 8.9–10.3)
Chloride: 97 mmol/L — ABNORMAL LOW (ref 98–111)
Creatinine, Ser: 5.66 mg/dL — ABNORMAL HIGH (ref 0.61–1.24)
GFR, Estimated: 11 mL/min — ABNORMAL LOW (ref >60)
Glucose, Bld: 106 mg/dL — ABNORMAL HIGH (ref 70–99)
Phosphorus: 2.1 mg/dL — ABNORMAL LOW (ref 2.5–4.6)
Potassium: 3.7 mmol/L (ref 3.5–5.1)
Sodium: 134 mmol/L — ABNORMAL LOW (ref 135–145)

## 2023-02-10 LAB — CBC
HCT: 29.1 % — ABNORMAL LOW (ref 39.0–52.0)
Hemoglobin: 9.3 g/dL — ABNORMAL LOW (ref 13.0–17.0)
MCH: 28.4 pg (ref 26.0–34.0)
MCHC: 32 g/dL (ref 30.0–36.0)
MCV: 88.7 fL (ref 80.0–100.0)
Platelets: 135 10*3/uL — ABNORMAL LOW (ref 150–400)
RBC: 3.28 MIL/uL — ABNORMAL LOW (ref 4.22–5.81)
RDW: 16.9 % — ABNORMAL HIGH (ref 11.5–15.5)
WBC: 8.2 10*3/uL (ref 4.0–10.5)
nRBC: 0 % (ref 0.0–0.2)

## 2023-02-10 LAB — PROTIME-INR
INR: 2.9 — ABNORMAL HIGH (ref 0.8–1.2)
Prothrombin Time: 30.1 seconds — ABNORMAL HIGH (ref 11.4–15.2)

## 2023-02-10 LAB — LACTATE DEHYDROGENASE: LDH: 142 U/L (ref 98–192)

## 2023-02-10 LAB — MAGNESIUM: Magnesium: 1.7 mg/dL (ref 1.7–2.4)

## 2023-02-10 MED ORDER — MAGNESIUM SULFATE 4 GM/100ML IV SOLN
4.0000 g | Freq: Once | INTRAVENOUS | Status: AC
  Administered 2023-02-10: 4 g via INTRAVENOUS
  Filled 2023-02-10: qty 100

## 2023-02-10 MED ORDER — HEPARIN SODIUM (PORCINE) 1000 UNIT/ML IJ SOLN
2000.0000 [IU] | Freq: Once | INTRAMUSCULAR | Status: AC
  Administered 2023-02-10: 2000 [IU]

## 2023-02-10 MED ORDER — HEPARIN SODIUM (PORCINE) 1000 UNIT/ML IJ SOLN
INTRAMUSCULAR | Status: AC
  Filled 2023-02-10: qty 2

## 2023-02-10 MED ORDER — LACTATED RINGERS IV BOLUS
250.0000 mL | Freq: Once | INTRAVENOUS | Status: AC
  Administered 2023-02-10: 250 mL via INTRAVENOUS

## 2023-02-10 MED ORDER — WARFARIN 0.5 MG HALF TABLET
0.5000 mg | ORAL_TABLET | Freq: Once | ORAL | Status: AC
  Administered 2023-02-10: 0.5 mg via ORAL
  Filled 2023-02-10: qty 1

## 2023-02-10 MED ORDER — AMIODARONE LOAD VIA INFUSION
150.0000 mg | Freq: Once | INTRAVENOUS | Status: AC
  Administered 2023-02-10: 150 mg via INTRAVENOUS
  Filled 2023-02-10: qty 83.34

## 2023-02-10 MED ORDER — HEPARIN SODIUM (PORCINE) 1000 UNIT/ML IJ SOLN
3400.0000 [IU] | Freq: Once | INTRAMUSCULAR | Status: AC
  Administered 2023-02-10: 3400 [IU]
  Filled 2023-02-10: qty 4

## 2023-02-10 MED ORDER — HEPARIN SODIUM (PORCINE) 1000 UNIT/ML DIALYSIS
5000.0000 [IU] | Freq: Once | INTRAMUSCULAR | Status: AC
  Administered 2023-02-10: 5000 [IU] via INTRAVENOUS_CENTRAL
  Filled 2023-02-10 (×2): qty 5

## 2023-02-10 MED ORDER — QUINIDINE SULFATE 200 MG PO TABS
400.0000 mg | ORAL_TABLET | Freq: Two times a day (BID) | ORAL | Status: DC
  Administered 2023-02-10 – 2023-02-13 (×6): 400 mg via ORAL
  Filled 2023-02-10 (×7): qty 2

## 2023-02-10 NOTE — Progress Notes (Addendum)
LVAD RN(Allison) gave bolus amiodarone, she increased amiodarone 60mg  @33 .66ml/hr

## 2023-02-10 NOTE — Progress Notes (Signed)
Received patient in bed to unit.  Alert and oriented.  Informed consent signed and in chart.   TX duration:3.5  Patient tolerated well.  Transported back to the room  Alert, without acute distress.  Hand-off given to patient's nurse.   Access used: left [facility] Access issues: none  Total UF removed: 2.5L Medication(s) given: midodrine, hectorol, amiodarone bolus 149ml(lvad RN) amdiodarone 60mg  @33 .90ml/hr(lvad rn allison)   02/10/23 1120  Vitals  Temp 98.2 F (36.8 C)  Temp Source Oral  BP 95/76  MAP (mmHg) 83  BP Location Left Arm  BP Method Automatic  Patient Position (if appropriate) Lying  Pulse Rate (!) 57  Pulse Rate Source Monitor  ECG Heart Rate (!) 130  Resp 19  Oxygen Therapy  SpO2 100 %  O2 Device Nasal Cannula  O2 Flow Rate (L/min) 3 L/min  During Treatment Monitoring  HD Safety Checks Performed Yes  Intra-Hemodialysis Comments Tx completed  Dialysis Fluid Bolus Normal Saline  Bolus Amount (mL) 300 mL      [name] S [facility]

## 2023-02-10 NOTE — Progress Notes (Signed)
  Echocardiogram 2D Echocardiogram has been performed.  Delcie Roch 02/10/2023, 2:00 PM

## 2023-02-10 NOTE — Progress Notes (Signed)
ANTICOAGULATION CONSULT NOTE - Follow Up Consult  Pharmacy Consult for warfarin Indication: LVAD  Allergies  Allergen Reactions   Metformin And Related Diarrhea    Patient Measurements: Weight: 74.5 kg (164 lb 3.9 oz) (bed)  Vital Signs: Temp: 97.9 F (36.6 C) (06/07 1210) Temp Source: Oral (06/07 1210) BP: 87/61 (06/07 1210) Pulse Rate: 59 (06/07 1126)  Labs: Recent Labs    02/08/23 0915 02/08/23 0916 02/09/23 0017 02/10/23 0800 02/10/23 0836 02/10/23 0837  HGB 9.2*  --   --  9.3*  --   --   HCT 28.5*  --   --  29.1*  --   --   PLT 143*  --   --  135*  --   --   LABPROT 34.3*  --  34.1*  --  30.1*  --   INR 3.4*  --  3.3*  --  2.9*  --   CREATININE  --  8.24* 5.33*  --   --  5.66*     Estimated Creatinine Clearance: 13.8 mL/min (A) (by C-G formula based on SCr of 5.66 mg/dL (H)).   Medical History: Past Medical History:  Diagnosis Date   Angina    ASCVD (arteriosclerotic cardiovascular disease)    , Anterior infarction 2005, LAD diagonal bifurcation intervention 03/2004   Automatic implantable cardiac defibrillator -St. Jude's        Benign neoplasm of colon    CHF (congestive heart failure) (HCC)    Chronic systolic heart failure (HCC)    Coronary artery disease     Widely patent previously placed stents in the left anterior    Crohn's disease (HCC)    Deep venous thrombosis (HCC)    Recurrent-on Coumadin   Dialysis patient (HCC)    Dyspnea    Gastroparesis    GERD (gastroesophageal reflux disease)    High cholesterol    Hyperlipidemia    Hypersomnolent    Previous diagnosis of narcolepsy   Hypertension, essential    Ischemic cardiomyopathy    Ejection fraction 15-20% catheterization 2010   Type II or unspecified type diabetes mellitus without mention of complication, not stated as uncontrolled    Unspecified gastritis and gastroduodenitis without mention of hemorrhage      Assessment: 57 yo male on chronic Coumadin 5 mg daily for LVAD and  hx DVT. Last admission, amiodarone was increased, quinidine added, and continues on cefadroxil for chronic suppression. Admit INR 3.4 at above goal.    Last 2 admits he has been requiring less warfarin than PTA. Upon review, quinidine can potentially increase warfarin sensitivity (case reports showing mixed impact of interaction). -Hgb 9 stable, plt 140s stable, LDH 100s stable No bleeding noted. INR trending down 2.9 after holding -   Goal of Therapy:  INR 2-3 Monitor platelets by anticoagulation protocol: Yes   Plan:   warfarin 0.5mg  x1 tonight  Daily CBC and INR  Leota Sauers Pharm.D. CPP, BCPS Clinical Pharmacist [phone] 02/10/2023 1:07 PM   Please check AMION for all [facility] Pharmacy phone numbers After 10:00 PM, call Main Pharmacy [phone]

## 2023-02-10 NOTE — Progress Notes (Addendum)
Patient Name: [name] Date of Encounter: 02/10/2023  Primary Cardiologist: None Electrophysiologist: [name] Manges, MD  Interval Summary   No VT overnight. Currently in HD this am   No new complaints.  Inpatient Medications    Scheduled Meds:  aspirin EC  81 mg Oral Daily   atorvastatin  40 mg Oral q1800   cefadroxil  1,000 mg Oral Q M,W,F-1800   Chlorhexidine Gluconate Cloth  6 each Topical Q0600   cinacalcet  180 mg Oral Q M,W,F-HD   citalopram  20 mg Oral Daily   doxercalciferol  5 mcg Intravenous Q M,W,F-HD   heparin sodium (porcine)       heparin sodium (porcine)  2,000 Units Intracatheter Once   heparin sodium (porcine)  3,400 Units Intracatheter Once   loratadine  10 mg Oral Daily   metoCLOPramide  5 mg Oral TID AC & HS   midodrine  10 mg Oral Q M,W,F   pantoprazole  40 mg Oral BID AC   quiNIDine sulfate  200 mg Oral Q8H   sevelamer carbonate  0.8 g Oral TID WC   Warfarin - Pharmacist Dosing Inpatient   Does not apply q1600   Continuous Infusions:  sodium chloride     amiodarone 30 mg/hr (02/10/23 0419)   promethazine (PHENERGAN) injection (IM or IVPB) 12.5 mg (02/09/23 2306)   PRN Meds: acetaminophen, heparin sodium (porcine), ondansetron (ZOFRAN) IV, promethazine (PHENERGAN) injection (IM or IVPB), promethazine   Vital Signs    Vitals:   02/10/23 0730 02/10/23 0742 02/10/23 0800 02/10/23 0830  BP: 94/81 123/78 122/70 (!) 88/71  Pulse: 77 87 (!) 31 60  Resp: 17 18 16 18   Temp:      TempSrc:      SpO2: 100% 100% 100% 100%  Weight:        Intake/Output Summary (Last 24 hours) at 02/10/2023 0847 Last data filed at 02/09/2023 1900 Gross per 24 hour  Intake 275.09 ml  Output --  Net 275.09 ml   Filed Weights   02/09/23 0710 02/10/23 0408 02/10/23 0710  Weight: 79 kg 78 kg 76.8 kg    Physical Exam    GEN- The patient is well appearing, alert and oriented x 3 today.   Lungs- Clear to ausculation bilaterally, normal work of  breathing Cardiac- Regular rate and rhythm, no murmurs, rubs or gallops GI- soft, NT, ND, + BS Extremities- no clubbing or cyanosis. No edema  Telemetry    AV dual pacing 85 (personally reviewed)  Hospital Course    [name] is a 57 y.o. male with a history of VT with h/o shocks on amiodarone and mexitil, CAD, ICM s/p HM3 LVAD, gastroparesis, and ESRD who is being seen today for the evaluation of slow VT at the request of [name].   Assessment & Plan    VT, refractory Despite oral therapy with amiodarone and quindine Continue IV amiodarone. If tolerates HD can transition to po with taper.  Continue quinidine 200 mg q 8 hrs.    If he has further slow VT, he is more likely to have off target effects from increased quinidine than any durable benefit.    Potassium3.9 (06/06 0017) Magnesium  1.8 (06/06 0017) Creatinine, ser  5.33* (06/06 0017) Keep K > 4.0 and Mg > 2.0  His recurrent VT and multiple severe co morbidities portend a poor prognosis We have adjusted his ATP schemes to be very aggressive previously. Have not adjusted further this admission with very slow VT.  HFrEF. , HMIII, ICM  St Jude ICD.  NSTEMI in March 2018 with DES to LAD and RCA, complicated by cardiogenic shock, low output requiring milrinone.  Echo 8/18 with EF 20-25%, moderate  He is s/p HM3 LVAD placement. He had pump thrombosis 6/19 due to [facility] outflow graft kinking.  He had pump exchange for another HM3.  HF/LVAD team attending. On midodrine 10 mg tid.  On coumadin.    ESRD  IHD. M-W-F  Nephrology following.     GOC No good options for further escalation of AAD  Not ablation candidate due to potential for epicardial focus and LVAD in-situ.  Palliative care following.   For questions or updates, please contact [facility] Please consult [email or web address] for contact info under Cardiology/STEMI.  Signed, [name] Freer, PA-C  02/10/2023, 8:47 AM   I have seen and examined this  patient with [name].  Agree with above, note added to reflect my findings.  Patient with no further ventricular tachycardia overnight, though he did develop VT during dialysis.  VT remains slow.  He is minimally symptomatic.  GEN: Well nourished, well developed, in no acute distress  HEENT: normal  Neck: no JVD, carotid bruits, or masses Cardiac: LVAD hum, tachycardic; no murmurs, rubs, or gallops,no edema  Respiratory:  clear to auscultation bilaterally, normal work of breathing GI: soft, nontender, nondistended, + BS MS: no deformity or atrophy  Skin: warm and dry, device site well healed Neuro:  Strength and sensation are intact Psych: euthymic mood, full affect   VT storm: Patient has had recurrent episodes of ventricular tachycardia.  His defibrillator was interrogated which confirmed ventricular tachycardia.  Ventricular overdrive pacing was performed which converted him to sinus rhythm.  He feels somewhat well in PT due to his LVAD.  He is on quinidine and amiodarone IV.  [name] continue IV amiodarone for now.  Would increase quinidine to 400 mg twice daily.  I am concerned at this point that he he [name] continue to have episodes of ventricular tachycardia during dialysis.  He appears to be somewhat resistant to medication titration.  Palliative care may be beneficial. Chronic systolic heart failure: Post Saint Jude ICD.  Has HeartMate 2 LVAD.  Plan per heart failure cardiology End-stage renal disease: Dialysis per nephrology  [name] Tellado M. Kagan Mutchler MD 02/10/2023 2:38 PM

## 2023-02-10 NOTE — Progress Notes (Addendum)
Called to bedside during HD as patient went back into VT rates in 120s. VAD coordinator discussed with Dr. Shirlee Latch and amiodarone bolus administered and rate increased to 60 mg/hr. Remained in VT through remained of HD. EP notified. Vitals stable and patient asymptomatic.   Called to bedside again around 1215 with rates in 150s still in VT, having low flow alarms. Patient asymptomatic throughout entire event. Discussed with Dr. Shirlee Latch, plan to rebolus amiodarone and give fluid bolus ( LR). Repleting Mg, BMET 2 hrs post HD. Notified EP, at bedside.   Patient was able to be paced out of VT. Plan to increase quinidine to 400 mg BID.   Suspect he may be getting over diuresed in HD, -2.5L pulled in HD. Per patient report his weight today after HD was lower that his normal dry weight.  Update echo.  Brynda Peon, AGACNP-BC  Advanced Heart Failure Team  02/10/23

## 2023-02-10 NOTE — Plan of Care (Signed)
  Problem: Education: Goal: Patient will understand all VAD equipment and how it functions Outcome: Progressing Goal: Patient will be able to verbalize current INR target range and antiplatelet therapy for discharge home Outcome: Progressing   Problem: Cardiac: Goal: LVAD will function as expected and patient will experience no clinical alarms Outcome: Progressing   Problem: Education: Goal: Knowledge of General Education information will improve Description: Including pain rating scale, medication(s)/side effects and non-pharmacologic comfort measures Outcome: Progressing   Problem: Clinical Measurements: Goal: Ability to maintain clinical measurements within normal limits will improve Outcome: Progressing Goal: Will remain free from infection Outcome: Progressing Goal: Diagnostic test results will improve Outcome: Progressing Goal: Respiratory complications will improve Outcome: Progressing Goal: Cardiovascular complication will be avoided Outcome: Progressing   Problem: Activity: Goal: Risk for activity intolerance will decrease Outcome: Progressing   Problem: Nutrition: Goal: Adequate nutrition will be maintained Outcome: Progressing   Problem: Coping: Goal: Level of anxiety will decrease Outcome: Progressing   Problem: Elimination: Goal: Will not experience complications related to bowel motility Outcome: Progressing Goal: Will not experience complications related to urinary retention Outcome: Progressing   Problem: Pain Managment: Goal: General experience of comfort will improve Outcome: Progressing   Problem: Safety: Goal: Ability to remain free from injury will improve Outcome: Progressing   Problem: Skin Integrity: Goal: Risk for impaired skin integrity will decrease Outcome: Progressing

## 2023-02-10 NOTE — Procedures (Signed)
I was present at this dialysis session. I have reviewed the session itself and made appropriate changes.   Vital signs in last 24 hours:  Temp:  [97.8 F (36.6 C)-98.2 F (36.8 C)] 97.9 F (36.6 C) (06/07 0705) Pulse Rate:  [31-99] 60 (06/07 0830) Resp:  [14-20] 18 (06/07 0830) BP: (88-130)/(70-111) 88/71 (06/07 0830) SpO2:  [96 %-100 %] 100 % (06/07 0830) Weight:  [76.8 kg-78 kg] 76.8 kg (06/07 0710) Weight change: -6 kg Filed Weights   02/09/23 0710 02/10/23 0408 02/10/23 0710  Weight: 79 kg 78 kg 76.8 kg    Recent Labs  Lab 02/03/23 1633 02/04/23 0013 02/09/23 0017  NA 129*   < > 131*  K 3.2*   < > 3.9  CL 91*   < > 96*  CO2 27   < > 26  GLUCOSE 65*   < > 108*  BUN 21*   < > 13  CREATININE 6.59*   < > 5.33*  CALCIUM 7.6*   < > 8.4*  PHOS 4.2  --   --    < > = values in this interval not displayed.    Recent Labs  Lab 02/06/23 0014 02/07/23 0040 02/08/23 0915  WBC 6.5 6.8 7.1  HGB 9.2* 9.9* 9.2*  HCT 28.2* 31.3* 28.5*  MCV 89.0 89.2 86.6  PLT 125* 125* 143*    Scheduled Meds:  aspirin EC  81 mg Oral Daily   atorvastatin  40 mg Oral q1800   cefadroxil  1,000 mg Oral Q M,W,F-1800   Chlorhexidine Gluconate Cloth  6 each Topical Q0600   cinacalcet  180 mg Oral Q M,W,F-HD   citalopram  20 mg Oral Daily   doxercalciferol  5 mcg Intravenous Q M,W,F-HD   heparin sodium (porcine)       loratadine  10 mg Oral Daily   metoCLOPramide  5 mg Oral TID AC & HS   midodrine  10 mg Oral Q M,W,F   pantoprazole  40 mg Oral BID AC   quiNIDine sulfate  200 mg Oral Q8H   sevelamer carbonate  0.8 g Oral TID WC   Warfarin - Pharmacist Dosing Inpatient   Does not apply q1600   Continuous Infusions:  sodium chloride     amiodarone 30 mg/hr (02/10/23 0419)   promethazine (PHENERGAN) injection (IM or IVPB) 12.5 mg (02/09/23 2306)   PRN Meds:.acetaminophen, heparin sodium (porcine), ondansetron (ZOFRAN) IV, promethazine (PHENERGAN) injection (IM or IVPB), promethazine    Irena Cords,  MD 02/10/2023, 8:37 AM

## 2023-02-10 NOTE — Progress Notes (Addendum)
Advanced Heart Failure VAD Team Note  PCP-Cardiologist: None   Subjective:    Had refractory VT 6/5 while in HD.   Seen in HD, tolerating fine. No complaints.  LVAD INTERROGATION:  HeartMate III LVAD:   Flow 3.6 liters/min, speed 5300, power 4.2, PI 5.4  Objective:    Vital Signs:   Temp:  [97.8 F (36.6 C)-98.2 F (36.8 C)] 97.9 F (36.6 C) (06/07 0705) Pulse Rate:  [29-99] 78 (06/07 0930) Resp:  [14-21] 15 (06/07 0930) BP: (88-130)/(70-112) 129/112 (06/07 0930) SpO2:  [96 %-100 %] 100 % (06/07 0930) Weight:  [76.8 kg-78 kg] 76.8 kg (06/07 0710) Last BM Date : 02/09/23 Mean arterial Pressure  80s-90s  Intake/Output:   Intake/Output Summary (Last 24 hours) at 02/10/2023 0940 Last data filed at 02/09/2023 1900 Gross per 24 hour  Intake 275.09 ml  Output --  Net 275.09 ml     Physical Exam  General:  well appearing.  No respiratory difficulty HEENT: normal Neck: supple. JVD ~8 cm. Carotids 2+ bilat; no bruits. No lymphadenopathy or thyromegaly appreciated. L IJ HD cath Cor: PMI nondisplaced. Regular rate & rhythm. No rubs, gallops or murmurs. Lungs: clear Abdomen: soft, nontender, nondistended. No hepatosplenomegaly. No bruits or masses. Good bowel sounds. Extremities: no cyanosis, clubbing, rash, edema. Bilat AKA Neuro: alert & oriented x 3, cranial nerves grossly intact. moves all 4 extremities w/o difficulty. Affect pleasant.   Telemetry   Paced 80s (Personally reviewed)    EKG    AV paced 80s  Labs   Basic Metabolic Panel: Recent Labs  Lab 02/03/23 1633 02/04/23 0013 02/05/23 0019 02/05/23 1914 02/06/23 0014 02/07/23 0040 02/08/23 0915 02/08/23 0916 02/09/23 0017  NA 129*   < > 131*  --  129* 131*  --  128* 131*  K 3.2*   < > 3.6  --  3.6 4.1  --  3.7 3.9  CL 91*   < > 94*  --  93* 95*  --  89* 96*  CO2 27   < > 25  --  25 24  --  24 26  GLUCOSE 65*   < > 116*  --  156* 100*  --  110* 108*  BUN 21*   < > 16  --  21* 13  --  24* 13   CREATININE 6.59*   < > 5.90*  --  7.62* 5.76*  --  8.24* 5.33*  CALCIUM 7.6*   < > 8.2*  --  8.2* 8.4*  --  8.4* 8.4*  MG  --   --   --  1.9  --  1.8 1.8  --  1.8  PHOS 4.2  --   --   --   --   --   --   --   --    < > = values in this interval not displayed.    Liver Function Tests: Recent Labs  Lab 02/08/23 0916  AST 34  ALT 26  ALKPHOS 87  BILITOT 0.6  PROT 6.7  ALBUMIN 3.0*   No results for input(s): "LIPASE", "AMYLASE" in the last 168 hours. Recent Labs  Lab 02/03/23 1931  AMMONIA 46*    CBC: Recent Labs  Lab 02/05/23 0019 02/06/23 0014 02/07/23 0040 02/08/23 0915 02/10/23 0800  WBC 6.9 6.5 6.8 7.1 8.2  HGB 9.0* 9.2* 9.9* 9.2* 9.3*  HCT 29.4* 28.2* 31.3* 28.5* 29.1*  MCV 90.5 89.0 89.2 86.6 88.7  PLT 135* 125* 125* 143* 135*  INR: Recent Labs  Lab 02/06/23 0014 02/07/23 0040 02/08/23 0915 02/09/23 0017 02/10/23 0836  INR 3.3* 3.2* 3.4* 3.3* 2.9*    Other results: EKG:    Imaging   No results found.   Medications:     Scheduled Medications:  aspirin EC  81 mg Oral Daily   atorvastatin  40 mg Oral q1800   cefadroxil  1,000 mg Oral Q M,W,F-1800   Chlorhexidine Gluconate Cloth  6 each Topical Q0600   cinacalcet  180 mg Oral Q M,W,F-HD   citalopram  20 mg Oral Daily   doxercalciferol  5 mcg Intravenous Q M,W,F-HD   heparin sodium (porcine)       heparin sodium (porcine)  2,000 Units Intracatheter Once   heparin sodium (porcine)  3,400 Units Intracatheter Once   loratadine  10 mg Oral Daily   metoCLOPramide  5 mg Oral TID AC & HS   midodrine  10 mg Oral Q M,W,F   pantoprazole  40 mg Oral BID AC   quiNIDine sulfate  200 mg Oral Q8H   sevelamer carbonate  0.8 g Oral TID WC   Warfarin - Pharmacist Dosing Inpatient   Does not apply q1600    Infusions:  sodium chloride     amiodarone 30 mg/hr (02/10/23 0419)   promethazine (PHENERGAN) injection (IM or IVPB) 12.5 mg (02/09/23 2306)    PRN Medications: acetaminophen, heparin sodium  (porcine), ondansetron (ZOFRAN) IV, promethazine (PHENERGAN) injection (IM or IVPB), promethazine   Patient Profile   57 y.o. with history of CAD s/p anterior MI in 2010 and NSTEMI in 3/18 with DES to pRCA and mLAD and ischemic cardiomyopathy s/p Heartmate 3 LVAD in 2018 and ESRD on HD, recent admit for refractory VT. He also had hx of ischemic lower extremities s/p bilateral AKAs. Readmitted 5/31 for lethargy and AMS. AHF team primary with VAD.   Assessment/Plan:   1. Lethargy/AMS  - CT scan with diffuse bladder wall thickening consistent with cystitis; otherwise work up unremarkable. Symptoms possibly secondary to recurrent hypoglycemia also.  -Treated with antibiotics.  - much improved   2. VT storm - recent admit for refractory VT, failed multiple ATPs. EP consulted. Device adjusted>>ATP in ED with conversion. Placed on oral amiodarone + quinidine w/ suppression of VT - EP following. - quinidine increased 6/4.  - Continue IV amio.  - Prolonged episode of VT 6/1 lasting greater than 30 minutes.  No effects on LVAD hemodynamics.  Received amiodarone bolus.   - refractory VT 6/5 while in HD, amiodarone increased to 60mg /hr. Now back at 30mg /hr. Plan to transition to PO today? Management per EP.  - palliative team saw yesterday, plan to continue current treatment.    3. HFrEF, s/p HMIII, ICM  St Jude ICD.  NSTEMI in March 2018 with DES to LAD and RCA, complicated by cardiogenic shock, low output requiring milrinone.  Echo 8/18 with EF 20-25%, moderate MR. CPX (8/18) with severe functional impairment due to HF.  He is s/p HM3 LVAD placement. He had pump thrombosis 6/19 due to [facility] outflow graft kinking.  He had pump exchange for another HM3.  LVAD parameters stable but he does have PI events and low flow events at times with HD. He takes midodrine pre-HD, but needs hydralazine on non-HD days.  - Had Ramp Echo 5/21. Speed decreased to 5300 - Volume managed per HD. Appreciated nephrology's  assistance  - Continue midodrine 10 mg tid.  - On warfarin. INR 2.9 today  4. Driveline infection,  chronic  - Continue cefadroxil long term   - Blood cx negative x 3 days    5. ESRD  - iHD M-W-F  - nephrology following   6. PAD - S/P bilateral AKA  I reviewed the LVAD parameters from today, and compared the results to the patient's prior recorded data.  No programming changes were made.  The LVAD is functioning within specified parameters.  The patient performs LVAD self-test daily.  LVAD interrogation was negative for any significant power changes, alarms or PI events/speed drops.  LVAD equipment check completed and is in good working order.  Back-up equipment present.   LVAD education done on emergency procedures and precautions and reviewed exit site care.  Length of Stay: [address] [name]  02/10/2023, 9:40 AM  VAD Team --- VAD ISSUES ONLY--- Pager [phone] (7am - 7am)  Advanced Heart Failure Team  Pager [phone] (M-F; 7a - 5p)  Please contact [facility] for night-coverage after hours (5p -7a ) and weekends on [email or web address]  Patient seen with NP, agree with the above note.   He went back into VT at HD today.  His weight was lowered below his usual dry weight (75 kg vs 78 kg). He did not feel the VT but had 2 low flow alarms.    EP paced him back into NSR.   General: Well appearing this am. NAD.  HEENT: Normal. Neck: Supple, JVP 7-8 cm. Carotids OK.  Cardiac:  Mechanical heart sounds with LVAD hum present.  Lungs:  CTAB, normal effort.  Abdomen:  NT, ND, no HSM. No bruits or masses. +BS  LVAD exit site: Well-healed and incorporated. Dressing dry and intact. No erythema or drainage. Stabilization device present and accurately applied. Driveline dressing changed daily per sterile technique. Extremities:  Warm and dry. No cyanosis, clubbing, rash, or edema. S/p bilateral AKA.  Neuro:  Alert & oriented x 3. Cranial nerves grossly intact. Moves all 4 extremities w/o  difficulty. Affect pleasant    I think excess lowering of weight at HD may have been the proximate cause of this VT episode.  Would aim to keep his dry weight closer to 78 kg.  Continue IV amiodarone today and quinidine.  Will watch through weekend and then at HD Monday.  If he stays in NSR, can go home after HD.   I will get an echo to assess LV/RV.    [name] 02/10/2023 1:12 PM

## 2023-02-10 NOTE — Progress Notes (Signed)
LVAD Coordinator Rounding Note:  Admitted 02/04/23 due to AMS, hypotension, and hypoglycemia.   HM 3 LVAD implanted on 03/01/18 by Dr Donata Clay under destination therapy criteria.  CT head 02/03/23: No evidence of acute intracranial abnormality.   CT chest/abd/pelvis 02/03/23: 1. Diffuse bladder wall thickening with some inflammatory changes about the urinary bladder. This is concerning for cystitis. 2. Stable 5 mm nodule in the left upper lobe since the February exam. It has increased proximally 1 cm in average axial dimension since 2019. No follow-up needed if patient is low-risk (and has no known or suspected primary neoplasm). Non-contrast chest CT can be considered in 12 months if patient is high-risk. This recommendation follows the consensus statement: Guidelines for Management of Incidental Pulmonary Nodules Detected on CT Images: From the Fleischner Society 2017; Radiology 2017; 284:228-243. 3. The superior segment nodule in the right lower lobe nodule is obscured by airspace disease. 4. Cardiomegaly with left ventricular assist device and left chest ICD. 5. Coronary artery disease. 6. Small paraumbilical hernia contains fat without bowel. 7.  Aortic Atherosclerosis (ICD10-I70.0)   Paged to HD this morning. Pt in VT- rate 1202-130s. Pt asymptomatic. Discussed with Dr Shirlee Latch- order received for Amiodarone bolus 150 mg and to increase drip to 60 mg/hr. Remained in VT the remainder of HD. Had 2.5 L pulled.   Paged to [facility] at 1215 for persistent VT, rate 150s with associated LOW FLOW alarms. Pt asymptomatic. See Brynda Peon NP note for details. Dr Elberta Fortis and [name] PA to bedside. Able to pace pt out of VT. Will plan to increase Quinidine to 400 mg BID. Nausea has been better controlled over last few days.   Palliative care GOC meeting completed 6/6. See their note for details.    Completed course of antibiotics for suspected cystitis. Now on chronic suppressive Cefadroxil.   Vital  signs: Temp: 97.9 HR: 130 VT > 89 AV paced Doppler Pressure: 70 Automatic BP: 87/61 (71) O2 Sat: 97% on 3 L  Wt: 172.6>167.3>169.1.164.2 lbs    LVAD interrogation reveals:  Speed: 5300 Flow: 3.1 Power: 3.9 w PI: 2.1  Alarms: LOW FLOW x 2 Events:   Hematocrit: 20- DO NOT TITRATE  Fixed speed: 5300 Low speed limit: 5000  Drive Line: Dressing clean, dry, and intact. Anchor correctly applied. Weekly dressing changes using daily kit. Next dressing change due 02/16/23 per bedside RN.   Labs:  LDH trend: 134>219>139>142  INR trend: 3.3>3.2>3.4>3.3>2.9  Anticoagulation Plan: -INR Goal: 2.0 - 3.0 -ASA Dose: 81 mg daily  Device: -St Jude -Therapies: VF 240          VT 200   Arrythmias: hx of recurrent VT. Started on Quinidine 01/27/23.   Infection:  02/03/23>>blood cultures>> no growth x 5 days; final  Adverse Events on VAD: -Recent admission for VT. Started on Quinidine.   Plan/Recommendations:  1. Page VAD coordinator for any equipment or drive line issues 2. Weekly drive line dressing changes per bedside RN  Alyce Pagan RN VAD Coordinator  Office: [phone]  24/7 Pager: [phone]

## 2023-02-11 DIAGNOSIS — I472 Ventricular tachycardia, unspecified: Secondary | ICD-10-CM | POA: Diagnosis not present

## 2023-02-11 LAB — MAGNESIUM: Magnesium: 2.8 mg/dL — ABNORMAL HIGH (ref 1.7–2.4)

## 2023-02-11 LAB — GLUCOSE, CAPILLARY
Glucose-Capillary: 123 mg/dL — ABNORMAL HIGH (ref 70–99)
Glucose-Capillary: 127 mg/dL — ABNORMAL HIGH (ref 70–99)
Glucose-Capillary: 122 mg/dL — ABNORMAL HIGH (ref 70–99)
Glucose-Capillary: 111 mg/dL — ABNORMAL HIGH (ref 70–99)
Glucose-Capillary: 144 mg/dL — ABNORMAL HIGH (ref 70–99)

## 2023-02-11 LAB — BASIC METABOLIC PANEL
Anion gap: 13 (ref 5–15)
BUN: 18 mg/dL (ref 6–20)
CO2: 22 mmol/L (ref 22–32)
Calcium: 8.9 mg/dL (ref 8.9–10.3)
Chloride: 95 mmol/L — ABNORMAL LOW (ref 98–111)
Creatinine, Ser: 5.5 mg/dL — ABNORMAL HIGH (ref 0.61–1.24)
GFR, Estimated: 11 mL/min — ABNORMAL LOW (ref >60)
Glucose, Bld: 140 mg/dL — ABNORMAL HIGH (ref 70–99)
Potassium: 4.3 mmol/L (ref 3.5–5.1)
Sodium: 130 mmol/L — ABNORMAL LOW (ref 135–145)

## 2023-02-11 LAB — LACTATE DEHYDROGENASE: LDH: 162 U/L (ref 98–192)

## 2023-02-11 LAB — PROTIME-INR
INR: 1.8 — ABNORMAL HIGH (ref 0.8–1.2)
Prothrombin Time: 21.3 seconds — ABNORMAL HIGH (ref 11.4–15.2)

## 2023-02-11 MED ORDER — GERHARDT'S BUTT CREAM
TOPICAL_CREAM | Freq: Two times a day (BID) | CUTANEOUS | Status: DC
  Filled 2023-02-11: qty 1

## 2023-02-11 MED ORDER — WARFARIN SODIUM 5 MG PO TABS
5.0000 mg | ORAL_TABLET | Freq: Once | ORAL | Status: AC
  Administered 2023-02-11: 5 mg via ORAL
  Filled 2023-02-11: qty 1

## 2023-02-11 NOTE — Progress Notes (Signed)
[facility] Progress Note  Subjective: pt seen in room, eating dinner, no c/o's.   Vitals:   02/11/23 0500 02/11/23 0826 02/11/23 1000 02/11/23 1100  BP: 96/85 110/77 (!) 119/108 110/68  Pulse:      Resp:  12 (!) 22 20  Temp:  97.8 F (36.6 C)  97.8 F (36.6 C)  TempSrc:  Oral  Oral  SpO2:  96%  94%  Weight: 74.1 kg       Exam: Gen: NAD CVS: mechanical hum Resp: CTA Abd: +BS, soft, NT/ND Ext: s/p B/L AKA R [facility] TDC intact    Home meds: hydralazine 25mg  tid on non hd days, colace, insulin glargine, atiavn prn, reglan 10 tid, renavite, percocet, warfarin, cefadroxil 1 gm post HD mwf, quinidine 20 bid, amiodarone 400 bid, aspirin, lipitor, celexa, midodrine 10mg  pre hd mwf, protonix, renvela 800mg  ac tid, prns/ vits/ supps   OP HD:  MWF [facility]  4h  400/1.5  77kg  2/2 bath  P4  Heparin 7000   [facility] TDC - last OP HD today 5/31, had 45 min HD, BP's dropped and pt was rinsed back - low IDWG's - hectorol 5 mcg IV tiw - sensipar 180 mg po tiw - mircera 60 mcg IV q 2 wks, last dose on 5/22, due 6/05       Assessment/ Plan: Hypotension/ gen'd weakness - LVAD patient on HD. Had shortened outpt HD session d/t low bp's the day of admission. In ED echo showed worsening R HF. IVF's were given in ED. SBP's since have improved up to 90s- 110 range. Refractory VT - recent admit for persistent VT, rx'd with amiodarone + mexiletine initially w/o success then was transitioned to amio+ quinidine and VT stabilized.  Having slow VT per CHF team, now back in paced rhythm.  Keep K>4 and Mg >2.  Severe ischemic cardiomyopathy/congestive heart failure with reduced ejection fraction - s/p HeartMate 3 LVAD and ongoing anticoagulation with Coumadin and blood pressure support with midodrine.  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 ESRD: usual HD MWF. Tolerated HD well yesterday with UF of 2 Liters. Next HD plan is for Monday.  4K bath if K+  low in the 3s.  Volume:  below edw by 4kg, per CHF team this is lower than usual. Will keep even next HD and see if we can get volume come up to ~ 78kg.  Anemia: Hb 9.9, next esa due on 6/05. Follow.  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 Cont IV vdra and po sensipar.  Chronic hypotension: cont midodrine 10mg  3x per wk pre HD   Rob Lenville Hibberd MD [facility] 02/11/2023, 2:20 PM  Recent Labs  Lab 02/08/23 0915 02/08/23 0916 02/09/23 0017 02/10/23 0800 02/10/23 0837 02/10/23 1356 02/11/23 0033  HGB 9.2*  --   --  9.3*  --   --   --   ALBUMIN  --  3.0*  --   --  2.9*  --   --   CALCIUM  --  8.4*   < >  --  8.7* 8.9 8.9  PHOS  --   --   --   --  2.1*  --   --   CREATININE  --  8.24*   < >  --  5.66* 4.19* 5.50*  K  --  3.7   < >  --  3.7 3.7 4.3   < > = values in this interval not displayed.  No results for input(s): "IRON", "TIBC", "FERRITIN" in the last 168 hours. Inpatient medications:  aspirin EC  81 mg Oral Daily   atorvastatin  40 mg Oral q1800   cefadroxil  1,000 mg Oral Q M,W,F-1800   Chlorhexidine Gluconate Cloth  6 each Topical Q0600   cinacalcet  180 mg Oral Q M,W,F-HD   citalopram  20 mg Oral Daily   doxercalciferol  5 mcg Intravenous Q M,W,F-HD   Gerhardt's butt cream   Topical BID   loratadine  10 mg Oral Daily   metoCLOPramide  5 mg Oral TID AC & HS   midodrine  10 mg Oral Q M,W,F   pantoprazole  40 mg Oral BID AC   quiNIDine sulfate  400 mg Oral BID   sevelamer carbonate  0.8 g Oral TID WC   warfarin  5 mg Oral ONCE-1600   Warfarin - Pharmacist Dosing Inpatient   Does not apply q1600    sodium chloride     amiodarone 60 mg/hr (02/11/23 1300)   promethazine (PHENERGAN) injection (IM or IVPB) Stopped (02/09/23 2321)   acetaminophen, ondansetron (ZOFRAN) IV, promethazine (PHENERGAN) injection (IM or IVPB), promethazine

## 2023-02-11 NOTE — Plan of Care (Signed)
  Problem: Education: Goal: Knowledge of General Education information will improve Description: Including pain rating scale, medication(s)/side effects and non-pharmacologic comfort measures Outcome: Progressing   Problem: Health Behavior/Discharge Planning: Goal: Ability to manage health-related needs will improve Outcome: Progressing   Problem: Clinical Measurements: Goal: Ability to maintain clinical measurements within normal limits will improve Outcome: Progressing Goal: Cardiovascular complication will be avoided Outcome: Progressing   Problem: Nutrition: Goal: Adequate nutrition will be maintained Outcome: Progressing   Problem: Skin Integrity: Goal: Risk for impaired skin integrity will decrease Outcome: Progressing

## 2023-02-11 NOTE — Progress Notes (Signed)
Patient ID: [name], male   DOB: 1966-04-01, 57 y.o.   MRN: [id number]   Advanced Heart Failure VAD Team Note  PCP-Cardiologist: None   Subjective:    Had VT 6/5 and again 6/7 in HD.  They have been taking more fluid off here than normal.  VT has been slow. EP able pace him out yesterday.   No further VT.    Echo yesterday was difficult, showed severe RV dysfunction.  Interventricular septum is midline.   LVAD INTERROGATION:  HeartMate III LVAD:   Flow 4.1 liters/min, speed 5300, power 4, PI 4.4  Objective:    Vital Signs:   Temp:  [97.8 F (36.6 C)-98.2 F (36.8 C)] 97.8 F (36.6 C) (06/08 0826) Pulse Rate:  [57-149] 149 (06/07 1512) Resp:  [12-24] 12 (06/08 0826) BP: (87-118)/(61-93) 110/77 (06/08 0826) SpO2:  [95 %-100 %] 96 % (06/08 0826) Weight:  [74.1 kg-74.5 kg] 74.1 kg (06/08 0500) Last BM Date : 02/10/23 Mean arterial Pressure  90s  Intake/Output:   Intake/Output Summary (Last 24 hours) at 02/11/2023 1048 Last data filed at 02/10/2023 1126 Gross per 24 hour  Intake --  Output 2500 ml  Net -2500 ml     Physical Exam  General: Well appearing this am. NAD.  HEENT: Normal. Neck: Supple, JVP 7-8 cm. Carotids OK.  Cardiac:  Mechanical heart sounds with LVAD hum present.  Lungs:  CTAB, normal effort.  Abdomen:  NT, ND, no HSM. No bruits or masses. +BS  LVAD exit site: Well-healed and incorporated. Dressing dry and intact. No erythema or drainage. Stabilization device present and accurately applied. Driveline dressing changed daily per sterile technique. Extremities:  Warm and dry. No cyanosis, clubbing, rash, or edema. Bilateral AKA.  Neuro:  Alert & oriented x 3. Cranial nerves grossly intact. Moves all 4 extremities w/o difficulty. Affect pleasant     Telemetry   Paced 80s (Personally reviewed)    EKG    AV paced 80s  Labs   Basic Metabolic Panel: Recent Labs  Lab 02/07/23 0040 02/08/23 0915 02/08/23 0916 02/09/23 0017 02/10/23 0837  02/10/23 1356 02/11/23 0033  NA 131*  --  128* 131* 134* 133* 130*  K 4.1  --  3.7 3.9 3.7 3.7 4.3  CL 95*  --  89* 96* 97* 93* 95*  CO2 24  --  24 26 26 25 22   GLUCOSE 100*  --  110* 108* 106* 157* 140*  BUN 13  --  24* 13 15 13 18   CREATININE 5.76*  --  8.24* 5.33* 5.66* 4.19* 5.50*  CALCIUM 8.4*  --  8.4* 8.4* 8.7* 8.9 8.9  MG 1.8 1.8  --  1.8 1.7  --  2.8*  PHOS  --   --   --   --  2.1*  --   --     Liver Function Tests: Recent Labs  Lab 02/08/23 0916 02/10/23 0837  AST 34  --   ALT 26  --   ALKPHOS 87  --   BILITOT 0.6  --   PROT 6.7  --   ALBUMIN 3.0* 2.9*   No results for input(s): "LIPASE", "AMYLASE" in the last 168 hours. No results for input(s): "AMMONIA" in the last 168 hours.   CBC: Recent Labs  Lab 02/05/23 0019 02/06/23 0014 02/07/23 0040 02/08/23 0915 02/10/23 0800  WBC 6.9 6.5 6.8 7.1 8.2  HGB 9.0* 9.2* 9.9* 9.2* 9.3*  HCT 29.4* 28.2* 31.3* 28.5* 29.1*  MCV 90.5 89.0  89.2 86.6 88.7  PLT 135* 125* 125* 143* 135*    INR: Recent Labs  Lab 02/07/23 0040 02/08/23 0915 02/09/23 0017 02/10/23 0836 02/11/23 0033  INR 3.2* 3.4* 3.3* 2.9* 1.8*    Other results: EKG:    Imaging   ECHOCARDIOGRAM LIMITED  Result Date: 02/10/2023    ECHOCARDIOGRAM REPORT   Patient Name:   [name] [facility] Date of Exam: 02/10/2023 Medical Rec #:  [id number]          Height:       68.0 in Accession #:    [id number]         Weight:       164.2 lb Date of Birth:  03/25/1966          BSA:          1.880 m Patient Age:    57 years           BP:           0/0 mmHg Patient Gender: M                  HR:           86 bpm. Exam Location:  Inpatient Procedure: Limited Echo, Limited Color Doppler and Cardiac Doppler STAT ECHO Indications:    Ventricular tachycardia  History:        Patient has prior history of Echocardiogram examinations, most                 recent 02/03/2023. CHF, Defibrillator and LVAD, end stage renal                 disease; Risk Factors:Diabetes.   Sonographer:    Delcie Roch RDCS Referring Phys: [phone] ALMA L DIAZ  Sonographer Comments: Technically difficult study due to poor echo windows. IMPRESSIONS  1. Left ventricular ejection fraction, by estimation, is <20%. The left ventricle has severely decreased function. The left ventricle demonstrates global hypokinesis. The left ventricular internal cavity size was severely dilated. Left ventricular diastolic parameters are indeterminate.  2. Right ventricular systolic function is severely reduced. The right ventricular size is moderately enlarged.  3. The mitral valve is normal in structure. Trivial mitral valve regurgitation.  4. There is severe biventricular dysfunction. The LV is markedly dilated and hypokinetic. LVAD cannula not seen well in apex but appears grossly normal. Interventricular septum pulls slightly to the left on apical images.  5. The aortic valve does not open in setting of LVAD support. The aortic valve is tricuspid. Aortic valve regurgitation is mild. FINDINGS  Left Ventricle: Left ventricular ejection fraction, by estimation, is <20%. The left ventricle has severely decreased function. The left ventricle demonstrates global hypokinesis. The left ventricular internal cavity size was severely dilated. There is no left ventricular hypertrophy. Left ventricular diastolic parameters are indeterminate. Right Ventricle: The right ventricular size is moderately enlarged. No increase in right ventricular wall thickness. Right ventricular systolic function is severely reduced. Left Atrium: Left atrial size was not well visualized. Right Atrium: Right atrial size was not well visualized. Pericardium: There is no evidence of pericardial effusion. Mitral Valve: The mitral valve is normal in structure. Trivial mitral valve regurgitation. Tricuspid Valve: The tricuspid valve is normal in structure. Tricuspid valve regurgitation is trivial. Aortic Valve: The aortic valve does not open in setting of  LVAD support. The aortic valve is tricuspid. Aortic valve regurgitation is mild. Pulmonic Valve: The pulmonic valve was not well visualized. Pulmonic valve regurgitation is not  visualized. Aorta: The aortic root was not well visualized. IAS/Shunts: No atrial level shunt detected by color flow Doppler. Additional Comments: A device lead is visualized.  IVC IVC diam: 1.50 cm TRICUSPID VALVE TR Peak grad:   11.2 mmHg TR Vmax:        167.00 cm/s Arvilla Meres MD Electronically signed by Arvilla Meres MD Signature Date/Time: 02/10/2023/3:10:41 PM    Final      Medications:     Scheduled Medications:  aspirin EC  81 mg Oral Daily   atorvastatin  40 mg Oral q1800   cefadroxil  1,000 mg Oral Q M,W,F-1800   Chlorhexidine Gluconate Cloth  6 each Topical Q0600   cinacalcet  180 mg Oral Q M,W,F-HD   citalopram  20 mg Oral Daily   doxercalciferol  5 mcg Intravenous Q M,W,F-HD   loratadine  10 mg Oral Daily   metoCLOPramide  5 mg Oral TID AC & HS   midodrine  10 mg Oral Q M,W,F   pantoprazole  40 mg Oral BID AC   quiNIDine sulfate  400 mg Oral BID   sevelamer carbonate  0.8 g Oral TID WC   Warfarin - Pharmacist Dosing Inpatient   Does not apply q1600    Infusions:  sodium chloride     amiodarone 60 mg/hr (02/11/23 0945)   promethazine (PHENERGAN) injection (IM or IVPB) Stopped (02/09/23 2321)    PRN Medications: acetaminophen, ondansetron (ZOFRAN) IV, promethazine (PHENERGAN) injection (IM or IVPB), promethazine   Patient Profile   57 y.o. with history of CAD s/p anterior MI in 2010 and NSTEMI in 3/18 with DES to pRCA and mLAD and ischemic cardiomyopathy s/p Heartmate 3 LVAD in 2018 and ESRD on HD, recent admit for refractory VT. He also had hx of ischemic lower extremities s/p bilateral AKAs. Readmitted 5/31 for lethargy and AMS. AHF team primary with VAD.   Assessment/Plan:   1. Lethargy/AMS  - CT scan with diffuse bladder wall thickening consistent with cystitis; otherwise work up  unremarkable. Symptoms possibly secondary to recurrent hypoglycemia also.  -Treated with antibiotics.  - much improved   2. VT storm - recent admit for refractory VT, failed multiple ATPs. EP consulted. Device adjusted>>ATP in ED with conversion. Placed on oral amiodarone + quinidine w/ suppression of VT.  Suspect increased VT is due to worsening RV failure.  - EP following. - quinidine increased 6/4 and again 6/7, now on 400 mg bid.  - Prolonged episode of VT 6/1 lasting greater than 30 minutes.  No effects on LVAD hemodynamics.  Received amiodarone bolus.   - refractory VT 6/5 while in HD, amiodarone increased to 60mg /hr. - VT again at HD 6/7.  - VT has been slow and well-tolerated.  - palliative team saw, plan to continue current treatment.  - Pulling more fluid off him at HD than at home.  Would keep dry weight 78 kg, may help with VT avoidance.  - Decrease amiodarone to 30 mg/hr.  Will plan to get him through HD Monday and if he does ok, then home.    3. HFrEF, s/p HMIII, ICM  St Jude ICD.  NSTEMI in March 2018 with DES to LAD and RCA, complicated by cardiogenic shock, low output requiring milrinone.  Echo 8/18 with EF 20-25%, moderate MR. CPX (8/18) with severe functional impairment due to HF.  He is s/p HM3 LVAD placement. He had pump thrombosis 6/19 due to [facility] outflow graft kinking.  He had pump exchange for another HM3.  LVAD  parameters stable but he does have PI events and low flow events at times with HD. He takes midodrine pre-HD, but needs hydralazine on non-HD days.  - Had Ramp Echo 5/21. Speed decreased to 5300 - Echo this admission with midline septum, severe RV dysfunction.  - Volume managed per HD. Appreciated nephrology's assistance  - Continue midodrine 10 mg tid.  - On warfarin.   4. Driveline infection, chronic  - Continue cefadroxil long term     5. ESRD  - iHD M-W-F  - nephrology following - As above, would keep dry weight at 78 kg, VT during HD may be triggered  by pulling off excess fluid.    6. PAD - S/P bilateral AKA  I reviewed the LVAD parameters from today, and compared the results to the patient's prior recorded data.  No programming changes were made.  The LVAD is functioning within specified parameters.  The patient performs LVAD self-test daily.  LVAD interrogation was negative for any significant power changes, alarms or PI events/speed drops.  LVAD equipment check completed and is in good working order.  Back-up equipment present.   LVAD education done on emergency procedures and precautions and reviewed exit site care.  Length of Stay: 8  Solmon Bohr 02/11/2023 10:48 AM

## 2023-02-11 NOTE — Progress Notes (Signed)
ANTICOAGULATION CONSULT NOTE - Follow Up Consult  Pharmacy Consult for warfarin Indication: LVAD  Allergies  Allergen Reactions   Metformin And Related Diarrhea    Patient Measurements: Weight: 74.1 kg (163 lb 5.8 oz)  Vital Signs: Temp: 97.8 F (36.6 C) (06/08 1100) Temp Source: Oral (06/08 1100) BP: 110/68 (06/08 1100)  Labs: Recent Labs    02/09/23 0017 02/10/23 0800 02/10/23 0836 02/10/23 0837 02/10/23 1356 02/11/23 0033  HGB  --  9.3*  --   --   --   --   HCT  --  29.1*  --   --   --   --   PLT  --  135*  --   --   --   --   LABPROT 34.1*  --  30.1*  --   --  21.3*  INR 3.3*  --  2.9*  --   --  1.8*  CREATININE 5.33*  --   --  5.66* 4.19* 5.50*     Estimated Creatinine Clearance: 14.2 mL/min (A) (by C-G formula based on SCr of 5.5 mg/dL (H)).   Medical History: Past Medical History:  Diagnosis Date   Angina    ASCVD (arteriosclerotic cardiovascular disease)    , Anterior infarction 2005, LAD diagonal bifurcation intervention 03/2004   Automatic implantable cardiac defibrillator -[employer]. 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 Last admission, amiodarone  was increased, quinidine added, and continues on cefadroxil for chronic suppression. Admit INR 3.4 at above goal.    Last 2 admits he has been requiring less warfarin than PTA. Upon review, quinidine can potentially increase warfarin sensitivity (case reports showing mixed impact of interaction). -LDH 100s stable, INR 2.9> 1.8  PTA dose warfarin 5 mg daily   Goal of Therapy:  INR 2-3 Monitor platelets by anticoagulation protocol: Yes   Plan:  -warfarin 5mg  today -Daily PT/INR  Harland German, PharmD Clinical Pharmacist **Pharmacist phone directory can now be found on [email or web address] ([email or web address]).  Listed under [facility].

## 2023-02-12 DIAGNOSIS — I953 Hypotension of hemodialysis: Secondary | ICD-10-CM | POA: Diagnosis not present

## 2023-02-12 DIAGNOSIS — Z95811 Presence of heart assist device: Secondary | ICD-10-CM | POA: Diagnosis not present

## 2023-02-12 LAB — BASIC METABOLIC PANEL
Anion gap: 17 — ABNORMAL HIGH (ref 5–15)
BUN: 32 mg/dL — ABNORMAL HIGH (ref 6–20)
CO2: 20 mmol/L — ABNORMAL LOW (ref 22–32)
Calcium: 9 mg/dL (ref 8.9–10.3)
Chloride: 94 mmol/L — ABNORMAL LOW (ref 98–111)
Creatinine, Ser: 7.7 mg/dL — ABNORMAL HIGH (ref 0.61–1.24)
GFR, Estimated: 8 mL/min — ABNORMAL LOW (ref >60)
Glucose, Bld: 102 mg/dL — ABNORMAL HIGH (ref 70–99)
Potassium: 4.5 mmol/L (ref 3.5–5.1)
Sodium: 131 mmol/L — ABNORMAL LOW (ref 135–145)

## 2023-02-12 LAB — CBC
HCT: 30.2 % — ABNORMAL LOW (ref 39.0–52.0)
Hemoglobin: 9.8 g/dL — ABNORMAL LOW (ref 13.0–17.0)
MCH: 27.9 pg (ref 26.0–34.0)
MCHC: 32.5 g/dL (ref 30.0–36.0)
MCV: 86 fL (ref 80.0–100.0)
Platelets: 144 10*3/uL — ABNORMAL LOW (ref 150–400)
RBC: 3.51 MIL/uL — ABNORMAL LOW (ref 4.22–5.81)
RDW: 16.9 % — ABNORMAL HIGH (ref 11.5–15.5)
WBC: 11 10*3/uL — ABNORMAL HIGH (ref 4.0–10.5)
nRBC: 0 % (ref 0.0–0.2)

## 2023-02-12 LAB — GLUCOSE, CAPILLARY
Glucose-Capillary: 103 mg/dL — ABNORMAL HIGH (ref 70–99)
Glucose-Capillary: 104 mg/dL — ABNORMAL HIGH (ref 70–99)
Glucose-Capillary: 108 mg/dL — ABNORMAL HIGH (ref 70–99)
Glucose-Capillary: 111 mg/dL — ABNORMAL HIGH (ref 70–99)
Glucose-Capillary: 120 mg/dL — ABNORMAL HIGH (ref 70–99)
Glucose-Capillary: 122 mg/dL — ABNORMAL HIGH (ref 70–99)
Glucose-Capillary: 127 mg/dL — ABNORMAL HIGH (ref 70–99)

## 2023-02-12 LAB — LACTATE DEHYDROGENASE: LDH: 171 U/L (ref 98–192)

## 2023-02-12 LAB — PROTIME-INR
INR: 2.6 — ABNORMAL HIGH (ref 0.8–1.2)
Prothrombin Time: 28.2 seconds — ABNORMAL HIGH (ref 11.4–15.2)

## 2023-02-12 LAB — MAGNESIUM: Magnesium: 2.6 mg/dL — ABNORMAL HIGH (ref 1.7–2.4)

## 2023-02-12 MED ORDER — HYALURONIDASE HUMAN 150 UNIT/ML IJ SOLN
200.0000 [IU] | Freq: Once | INTRAMUSCULAR | Status: AC
  Administered 2023-02-12: 200 [IU] via SUBCUTANEOUS
  Filled 2023-02-12: qty 1.33

## 2023-02-12 MED ORDER — AMIODARONE HCL 200 MG PO TABS
400.0000 mg | ORAL_TABLET | Freq: Three times a day (TID) | ORAL | Status: DC
  Administered 2023-02-12 (×2): 400 mg via ORAL
  Filled 2023-02-12 (×2): qty 2

## 2023-02-12 MED ORDER — CHLORHEXIDINE GLUCONATE CLOTH 2 % EX PADS
6.0000 | MEDICATED_PAD | Freq: Every day | CUTANEOUS | Status: DC
  Administered 2023-02-12 – 2023-02-13 (×2): 6 via TOPICAL

## 2023-02-12 NOTE — Progress Notes (Signed)
Patient ID: [name], male   DOB: [birth year], 57 y.o.   MRN: [id number]   Advanced Heart Failure VAD Team Note  PCP-Cardiologist: None   Subjective:    Had VT 6/5 and again 6/7 in HD.  They have been taking more fluid off here than normal.  VT has been slow. EP able pace him out yesterday.   - No further VT - Lost IV access due to infiltration   LVAD INTERROGATION:  HeartMate III LVAD:   Flow 4.3 liters/min, speed 5300, power 4, PI 4.4  Objective:    Vital Signs:   Temp:  [97.6 F (36.4 C)-98.7 F (37.1 C)] 97.6 F (36.4 C) (06/09 1100) Pulse Rate:  [118] 118 (06/08 1800) Resp:  [16-21] 16 (06/09 1100) BP: (99-135)/(73-115) 113/89 (06/09 1100) SpO2:  [94 %-100 %] 97 % (06/09 1100) Weight:  [76.1 kg] 76.1 kg (06/09 0324) Last BM Date : 02/11/23 Mean arterial Pressure  90s  Intake/Output:   Intake/Output Summary (Last 24 hours) at 02/12/2023 1321 Last data filed at 02/11/2023 1900 Gross per 24 hour  Intake 366.58 ml  Output --  Net 366.58 ml      Physical Exam  General: Well appearing this am. NAD.  HEENT: Normal. Neck: Supple, JVP 7-8 cm. Carotids OK.  Cardiac:  Mechanical heart sounds with LVAD hum present.  Lungs:  CTAB, normal effort.  Abdomen:  NT, ND, no HSM. No bruits or masses. +BS  LVAD exit site: Well-healed and incorporated. Dressing dry and intact. No erythema or drainage. Stabilization device present and accurately applied. Driveline dressing changed daily per sterile technique. Extremities:  Warm and dry. No cyanosis, clubbing, rash, or edema. Bilateral AKA.  Neuro:  Alert & oriented x 3. Cranial nerves grossly intact. Moves all 4 extremities w/o difficulty. Affect pleasant     Telemetry   Paced 80s (Personally reviewed)    EKG    AV paced 80s  Labs   Basic Metabolic Panel: Recent Labs  Lab 02/08/23 0915 02/08/23 0916 02/09/23 0017 02/10/23 0837 02/10/23 1356 02/11/23 0033 02/12/23 0042  NA  --    < > 131* 134* 133* 130* 131*   K  --    < > 3.9 3.7 3.7 4.3 4.5  CL  --    < > 96* 97* 93* 95* 94*  CO2  --    < > 26 26 25 22  20*  GLUCOSE  --    < > 108* 106* 157* 140* 102*  BUN  --    < > 13 15 13 18  32*  CREATININE  --    < > 5.33* 5.66* 4.19* 5.50* 7.70*  CALCIUM  --    < > 8.4* 8.7* 8.9 8.9 9.0  MG 1.8  --  1.8 1.7  --  2.8* 2.6*  PHOS  --   --   --  2.1*  --   --   --    < > = values in this interval not displayed.     Liver Function Tests: Recent Labs  Lab 02/08/23 0916 02/10/23 0837  AST 34  --   ALT 26  --   ALKPHOS 87  --   BILITOT 0.6  --   PROT 6.7  --   ALBUMIN 3.0* 2.9*    No results for input(s): "LIPASE", "AMYLASE" in the last 168 hours. No results for input(s): "AMMONIA" in the last 168 hours.   CBC: Recent Labs  Lab 02/06/23 0014 02/07/23 0040 02/08/23 0915  02/10/23 0800 02/12/23 0042  WBC 6.5 6.8 7.1 8.2 11.0*  HGB 9.2* 9.9* 9.2* 9.3* 9.8*  HCT 28.2* 31.3* 28.5* 29.1* 30.2*  MCV 89.0 89.2 86.6 88.7 86.0  PLT 125* 125* 143* 135* 144*     INR: Recent Labs  Lab 02/08/23 0915 02/09/23 0017 02/10/23 0836 02/11/23 0033 02/12/23 0042  INR 3.4* 3.3* 2.9* 1.8* 2.6*     Other results: EKG:    Imaging   ECHOCARDIOGRAM LIMITED  Result Date: 02/10/2023    ECHOCARDIOGRAM REPORT   Patient Name:   [name] Date of Exam: 02/10/2023 Medical Rec #:  [id number]          Height:       68.0 in Accession #:    [id number]         Weight:       164.2 lb Date of Birth:  05/06/1966          BSA:          1.880 m Patient Age:    57 years           BP:           0/0 mmHg Patient Gender: M                  HR:           86 bpm. Exam Location:  Inpatient Procedure: Limited Echo, Limited Color Doppler and Cardiac Doppler STAT ECHO Indications:    Ventricular tachycardia  History:        Patient has prior history of Echocardiogram examinations, most                 recent 02/03/2023. CHF, Defibrillator and LVAD, end stage renal                 disease; Risk Factors:Diabetes.   Sonographer:    Delcie Roch RDCS Referring Phys: [phone] ALMA L DIAZ  Sonographer Comments: Technically difficult study due to poor echo windows. IMPRESSIONS  1. Left ventricular ejection fraction, by estimation, is <20%. The left ventricle has severely decreased function. The left ventricle demonstrates global hypokinesis. The left ventricular internal cavity size was severely dilated. Left ventricular diastolic parameters are indeterminate.  2. Right ventricular systolic function is severely reduced. The right ventricular size is moderately enlarged.  3. The mitral valve is normal in structure. Trivial mitral valve regurgitation.  4. There is severe biventricular dysfunction. The LV is markedly dilated and hypokinetic. LVAD cannula not seen well in apex but appears grossly normal. Interventricular septum pulls slightly to the left on apical images.  5. The aortic valve does not open in setting of LVAD support. The aortic valve is tricuspid. Aortic valve regurgitation is mild. FINDINGS  Left Ventricle: Left ventricular ejection fraction, by estimation, is <20%. The left ventricle has severely decreased function. The left ventricle demonstrates global hypokinesis. The left ventricular internal cavity size was severely dilated. There is no left ventricular hypertrophy. Left ventricular diastolic parameters are indeterminate. Right Ventricle: The right ventricular size is moderately enlarged. No increase in right ventricular wall thickness. Right ventricular systolic function is severely reduced. Left Atrium: Left atrial size was not well visualized. Right Atrium: Right atrial size was not well visualized. Pericardium: There is no evidence of pericardial effusion. Mitral Valve: The mitral valve is normal in structure. Trivial mitral valve regurgitation. Tricuspid Valve: The tricuspid valve is normal in structure. Tricuspid valve regurgitation is trivial. Aortic Valve: The aortic valve does not  open in setting of  LVAD support. The aortic valve is tricuspid. Aortic valve regurgitation is mild. Pulmonic Valve: The pulmonic valve was not well visualized. Pulmonic valve regurgitation is not visualized. Aorta: The aortic root was not well visualized. IAS/Shunts: No atrial level shunt detected by color flow Doppler. Additional Comments: A device lead is visualized.  IVC IVC diam: 1.50 cm TRICUSPID VALVE TR Peak grad:   11.2 mmHg TR Vmax:        167.00 cm/s Arvilla Meres MD Electronically signed by Arvilla Meres MD Signature Date/Time: 02/10/2023/3:10:41 PM    Final      Medications:     Scheduled Medications:  amiodarone  400 mg Oral TID   aspirin EC  81 mg Oral Daily   atorvastatin  40 mg Oral q1800   cefadroxil  1,000 mg Oral Q M,W,F-1800   Chlorhexidine Gluconate Cloth  6 each Topical Q0600   Chlorhexidine Gluconate Cloth  6 each Topical Q0600   cinacalcet  180 mg Oral Q M,W,F-HD   citalopram  20 mg Oral Daily   doxercalciferol  5 mcg Intravenous Q M,W,F-HD   Gerhardt's butt cream   Topical BID   loratadine  10 mg Oral Daily   metoCLOPramide  5 mg Oral TID AC & HS   midodrine  10 mg Oral Q M,W,F   pantoprazole  40 mg Oral BID AC   quiNIDine sulfate  400 mg Oral BID   sevelamer carbonate  0.8 g Oral TID WC   Warfarin - Pharmacist Dosing Inpatient   Does not apply q1600    Infusions:  sodium chloride     promethazine (PHENERGAN) injection (IM or IVPB) Stopped (02/09/23 2321)    PRN Medications: acetaminophen, ondansetron (ZOFRAN) IV, promethazine (PHENERGAN) injection (IM or IVPB), promethazine   Patient Profile   57 y.o. with history of CAD s/p anterior MI in 2010 and NSTEMI in 3/18 with DES to pRCA and mLAD and ischemic cardiomyopathy s/p Heartmate 3 LVAD in 2018 and ESRD on HD, recent admit for refractory VT. He also had hx of ischemic lower extremities s/p bilateral AKAs. Readmitted 5/31 for lethargy and AMS. AHF team primary with VAD.   Assessment/Plan:   1. Lethargy/AMS  - CT  scan with diffuse bladder wall thickening consistent with cystitis; otherwise work up unremarkable. Symptoms possibly secondary to recurrent hypoglycemia also.  -Treated with antibiotics.  - Doing well today; no complaints.    2. VT storm - recent admit for refractory VT, failed multiple ATPs. EP consulted. Device adjusted>>ATP in ED with conversion. Placed on oral amiodarone + quinidine w/ suppression of VT.  Suspect increased VT is due to worsening RV failure.  - EP following. - quinidine increased 6/4 and again 6/7, now on 400 mg bid.  - Prolonged episode of VT 6/1 lasting greater than 30 minutes.  No effects on LVAD hemodynamics.  Received amiodarone bolus.   - refractory VT 6/5 while in HD, amiodarone increased to 60mg /hr. - VT again at HD 6/7.  - VT has been slow and well-tolerated.  - palliative team saw, plan to continue current treatment.  - Pulling more fluid off him at HD than at home.  Would keep dry weight 78 kg, may help with VT avoidance.  - Lost IV access; will complete load with PO amiodarone 400mg  TID today; transition to BID tomorrow. He does not wish to have central line placed at this time. Can use HD port as back up in case of emergency. Otherwise off all  IV drips.    3. HFrEF, s/p HMIII, ICM  St Jude ICD.  NSTEMI in March 2018 with DES to LAD and RCA, complicated by cardiogenic shock, low output requiring milrinone.  Echo 8/18 with EF 20-25%, moderate MR. CPX (8/18) with severe functional impairment due to HF.  He is s/p HM3 LVAD placement. He had pump thrombosis 6/19 due to [name] [employer] outflow graft kinking.  He had pump exchange for another HM3.  LVAD parameters stable but he does have PI events and low flow events at times with HD. He takes midodrine pre-HD, but needs hydralazine on non-HD days.  - Had Ramp Echo 5/21. Speed decreased to 5300 - Echo this admission with midline septum, severe RV dysfunction.  - Volume managed per HD. Appreciated nephrology's assistance  -  Continue midodrine 10 mg tid.  - On warfarin.   4. Driveline infection, chronic  - Continue cefadroxil long term     5. ESRD  - iHD M-W-F  - nephrology following - As above, would keep dry weight at 78 kg, VT during HD may be triggered by pulling off excess fluid.    6. PAD - S/P bilateral AKA  I reviewed the LVAD parameters from today, and compared the results to the patient's prior recorded data.  No programming changes were made.  The LVAD is functioning within specified parameters.  The patient performs LVAD self-test daily.  LVAD interrogation was negative for any significant power changes, alarms or PI events/speed drops.  LVAD equipment check completed and is in good working order.  Back-up equipment present.   LVAD education done on emergency procedures and precautions and reviewed exit site care.  Length of Stay: 9  Kaiulani Sitton 02/12/2023 1:21 PM

## 2023-02-12 NOTE — Progress Notes (Signed)
[facility] Progress Note  Subjective: no new c/o's.   Vitals:   02/12/23 0021 02/12/23 0300 02/12/23 0324 02/12/23 0804  BP: 108/73 (!) 115/94  99/75  Pulse:      Resp:  19  19  Temp:  97.6 F (36.4 C)  98.5 F (36.9 C)  TempSrc:  Axillary  Oral  SpO2:  100%  97%  Weight:   76.1 kg     Exam: Gen: NAD CVS: mechanical hum Resp: CTA Abd: +BS, soft, NT/ND Ext: s/p B/L AKA R [facility] TDC intact    Home meds: hydralazine 25mg  tid on non hd days, colace, insulin glargine, atiavn prn, reglan 10 tid, renavite, percocet, warfarin, cefadroxil 1 gm post HD mwf, quinidine 20 bid, amiodarone 400 bid, aspirin, lipitor, celexa, midodrine 10mg  pre hd mwf, protonix, renvela 800mg  ac tid, prns/ vits/ supps   OP HD:  MWF [facility]  4h  400/1.5  77kg  2/2 bath  P4  Heparin 7000   [facility] TDC - last OP HD today 5/31, had 45 min HD, BP's dropped and pt was rinsed back - low IDWG's - hectorol 5 mcg IV tiw - sensipar 180 mg po tiw - mircera 60 mcg IV q 2 wks, last dose on 5/22, due 6/05       Assessment/ Plan: Hypotension/ gen'd weakness - LVAD patient on HD. Had shortened outpt HD session due to low bp's the day of admission. In ED echo showed worsening R HF. IVF's were given. SBP's since have improved up to 90s- 110 range. Refractory VT - recent admit for persistent VT, rx'd with amiodarone + mexiletine initially w/o success then was transitioned to amio+ quinidine and VT stabilized.  Having slow VT here again off and on, per CHF team.  Keep K>4 and Mg >2.  Severe ischemic cardiomyopathy/congestive heart failure with reduced ejection fraction - s/p HeartMate 3 LVAD and ongoing anticoagulation with Coumadin and blood pressure support with midodrine.  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 ESRD: usual HD MWF. Next HD Monday 1st shift.  Volume:  below edw by 4kg, per CHF team this is lower than usual. Will keep even w/ HD tomorrow and see  if we can let his volume come up a few kg.  Anemia: Hb 9.9, next esa due on 6/05. Follow.  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 Cont IV vdra and po sensipar.  Chronic hypotension: cont midodrine 10mg  3x per wk pre HD   Rob Miron Marxen MD [facility] 02/12/2023, 10:48 AM  Recent Labs  Lab 02/08/23 0916 02/09/23 0017 02/10/23 0800 02/10/23 0837 02/10/23 1356 02/11/23 0033 02/12/23 0042  HGB  --   --  9.3*  --   --   --  9.8*  ALBUMIN 3.0*  --   --  2.9*  --   --   --   CALCIUM 8.4*   < >  --  8.7*   < > 8.9 9.0  PHOS  --   --   --  2.1*  --   --   --   CREATININE 8.24*   < >  --  5.66*   < > 5.50* 7.70*  K 3.7   < >  --  3.7   < > 4.3 4.5   < > = values in this interval not displayed.    No results for input(s): "IRON", "TIBC", "FERRITIN" in the last 168 hours. Inpatient medications:  aspirin EC  81  mg Oral Daily   atorvastatin  40 mg Oral q1800   cefadroxil  1,000 mg Oral Q M,W,F-1800   Chlorhexidine Gluconate Cloth  6 each Topical Q0600   cinacalcet  180 mg Oral Q M,W,F-HD   citalopram  20 mg Oral Daily   doxercalciferol  5 mcg Intravenous Q M,W,F-HD   Gerhardt's butt cream   Topical BID   loratadine  10 mg Oral Daily   metoCLOPramide  5 mg Oral TID AC & HS   midodrine  10 mg Oral Q M,W,F   pantoprazole  40 mg Oral BID AC   quiNIDine sulfate  400 mg Oral BID   sevelamer carbonate  0.8 g Oral TID WC   Warfarin - Pharmacist Dosing Inpatient   Does not apply q1600    sodium chloride     amiodarone 60 mg/hr (02/12/23 0853)   promethazine (PHENERGAN) injection (IM or IVPB) Stopped (02/09/23 2321)   acetaminophen, ondansetron (ZOFRAN) IV, promethazine (PHENERGAN) injection (IM or IVPB), promethazine

## 2023-02-12 NOTE — Progress Notes (Signed)
ANTICOAGULATION CONSULT NOTE - Follow Up Consult  Pharmacy Consult for warfarin Indication: LVAD  Allergies  Allergen Reactions   Metformin And Related Diarrhea    Patient Measurements: Weight: 76.1 kg (167 lb 12.3 oz)  Vital Signs: Temp: 97.6 F (36.4 C) (06/09 1100) Temp Source: Oral (06/09 1100) BP: 113/89 (06/09 1100)  Labs: Recent Labs    02/10/23 0800 02/10/23 0836 02/10/23 0837 02/10/23 1356 02/11/23 0033 02/12/23 0042  HGB 9.3*  --   --   --   --  9.8*  HCT 29.1*  --   --   --   --  30.2*  PLT 135*  --   --   --   --  144*  LABPROT  --  30.1*  --   --  21.3* 28.2*  INR  --  2.9*  --   --  1.8* 2.6*  CREATININE  --   --    < > 4.19* 5.50* 7.70*   < > = values in this interval not displayed.     Estimated Creatinine Clearance: 10.1 mL/min (A) (by C-G formula based on SCr of 7.7 mg/dL (H)).   Medical History: Past Medical History:  Diagnosis Date   Angina    ASCVD (arteriosclerotic cardiovascular disease)    , Anterior infarction 2005, LAD diagonal bifurcation intervention 03/2004   Automatic implantable cardiac defibrillator -St. 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 Last admission, amiodarone was increased, quinidine added, and continues on cefadroxil for chronic suppression. Admit INR 3.4 at above goal.    Last 2 admits he has been requiring less warfarin than PTA. Upon review, quinidine can potentially increase warfarin sensitivity (case reports showing mixed impact of interaction). -LDH 100s stable, INR 1.8> 2.6  PTA dose warfarin 5 mg daily   Goal of Therapy:  INR 2-3 Monitor platelets by anticoagulation protocol: Yes   Plan:  -hold warfarin today -Daily PT/INR  Harland German, PharmD Clinical Pharmacist **Pharmacist phone directory can now be found on [email or web address] ([email or web address]).  Listed under [facility].

## 2023-02-13 ENCOUNTER — Other Ambulatory Visit (HOSPITAL_COMMUNITY): Payer: Self-pay

## 2023-02-13 DIAGNOSIS — Z95811 Presence of heart assist device: Secondary | ICD-10-CM | POA: Diagnosis not present

## 2023-02-13 DIAGNOSIS — I472 Ventricular tachycardia, unspecified: Secondary | ICD-10-CM | POA: Diagnosis not present

## 2023-02-13 LAB — BASIC METABOLIC PANEL
Anion gap: 17 — ABNORMAL HIGH (ref 5–15)
BUN: 46 mg/dL — ABNORMAL HIGH (ref 6–20)
CO2: 19 mmol/L — ABNORMAL LOW (ref 22–32)
Calcium: 9.1 mg/dL (ref 8.9–10.3)
Chloride: 95 mmol/L — ABNORMAL LOW (ref 98–111)
Creatinine, Ser: 9.62 mg/dL — ABNORMAL HIGH (ref 0.61–1.24)
GFR, Estimated: 6 mL/min — ABNORMAL LOW (ref >60)
Glucose, Bld: 98 mg/dL (ref 70–99)
Potassium: 4.9 mmol/L (ref 3.5–5.1)
Sodium: 131 mmol/L — ABNORMAL LOW (ref 135–145)

## 2023-02-13 LAB — GLUCOSE, CAPILLARY
Glucose-Capillary: 110 mg/dL — ABNORMAL HIGH (ref 70–99)
Glucose-Capillary: 100 mg/dL — ABNORMAL HIGH (ref 70–99)

## 2023-02-13 LAB — CBC
HCT: 28.6 % — ABNORMAL LOW (ref 39.0–52.0)
Hemoglobin: 9.4 g/dL — ABNORMAL LOW (ref 13.0–17.0)
MCH: 27.6 pg (ref 26.0–34.0)
MCHC: 32.9 g/dL (ref 30.0–36.0)
MCV: 84.1 fL (ref 80.0–100.0)
Platelets: 136 10*3/uL — ABNORMAL LOW (ref 150–400)
RBC: 3.4 MIL/uL — ABNORMAL LOW (ref 4.22–5.81)
RDW: 16.2 % — ABNORMAL HIGH (ref 11.5–15.5)
WBC: 13 10*3/uL — ABNORMAL HIGH (ref 4.0–10.5)
nRBC: 0 % (ref 0.0–0.2)

## 2023-02-13 LAB — MAGNESIUM: Magnesium: 2.6 mg/dL — ABNORMAL HIGH (ref 1.7–2.4)

## 2023-02-13 LAB — PROTIME-INR
INR: 2.2 — ABNORMAL HIGH (ref 0.8–1.2)
Prothrombin Time: 24.4 seconds — ABNORMAL HIGH (ref 11.4–15.2)

## 2023-02-13 LAB — LACTATE DEHYDROGENASE: LDH: 185 U/L (ref 98–192)

## 2023-02-13 MED ORDER — HEPARIN SODIUM (PORCINE) 1000 UNIT/ML DIALYSIS
5000.0000 [IU] | Freq: Once | INTRAMUSCULAR | Status: AC
  Administered 2023-02-13: 5000 [IU] via INTRAVENOUS_CENTRAL
  Filled 2023-02-13: qty 5

## 2023-02-13 MED ORDER — AMIODARONE HCL 400 MG PO TABS
400.0000 mg | ORAL_TABLET | Freq: Two times a day (BID) | ORAL | 0 refills | Status: DC
  Filled 2023-02-13: qty 13, 7d supply, fill #0

## 2023-02-13 MED ORDER — WARFARIN SODIUM 5 MG PO TABS: 2.5000 mg | ORAL_TABLET | Freq: Every day | ORAL | 11 refills | Status: DC

## 2023-02-13 MED ORDER — QUINIDINE SULFATE 200 MG PO TABS
400.0000 mg | ORAL_TABLET | Freq: Two times a day (BID) | ORAL | 5 refills | Status: DC
  Filled 2023-02-13: qty 120, 30d supply, fill #0

## 2023-02-13 MED ORDER — METOCLOPRAMIDE HCL 5 MG PO TABS
5.0000 mg | ORAL_TABLET | Freq: Three times a day (TID) | ORAL | 5 refills | Status: DC
  Filled 2023-02-13: qty 90, 23d supply, fill #0

## 2023-02-13 MED ORDER — AMIODARONE HCL 200 MG PO TABS
ORAL_TABLET | ORAL | 5 refills | Status: DC
  Filled 2023-02-13: qty 74, 30d supply, fill #0

## 2023-02-13 MED ORDER — LORATADINE 10 MG PO TABS
10.0000 mg | ORAL_TABLET | Freq: Every day | ORAL | 5 refills | Status: DC
  Filled 2023-02-13: qty 100, 100d supply, fill #0

## 2023-02-13 MED ORDER — AMIODARONE HCL 200 MG PO TABS
400.0000 mg | ORAL_TABLET | Freq: Two times a day (BID) | ORAL | Status: DC
  Administered 2023-02-13: 400 mg via ORAL
  Filled 2023-02-13: qty 2

## 2023-02-13 MED ORDER — CINACALCET HCL 30 MG PO TABS
180.0000 mg | ORAL_TABLET | ORAL | 5 refills | Status: DC
  Filled 2023-02-13: qty 60, 23d supply, fill #0

## 2023-02-13 MED ORDER — HEPARIN SODIUM (PORCINE) 1000 UNIT/ML DIALYSIS: 2000.0000 [IU] | INTRAMUSCULAR | Status: DC | PRN

## 2023-02-13 MED ORDER — WARFARIN SODIUM 2.5 MG PO TABS
2.5000 mg | ORAL_TABLET | ORAL | Status: AC
  Administered 2023-02-13: 2.5 mg via ORAL
  Filled 2023-02-13: qty 1

## 2023-02-13 MED ORDER — AMIODARONE HCL 200 MG PO TABS
200.0000 mg | ORAL_TABLET | Freq: Two times a day (BID) | ORAL | 5 refills | Status: DC
  Filled 2023-02-13: qty 60, 30d supply, fill #0

## 2023-02-13 MED ORDER — AMIODARONE HCL 200 MG PO TABS: 200.0000 mg | ORAL_TABLET | Freq: Two times a day (BID) | ORAL | Status: DC

## 2023-02-13 NOTE — Progress Notes (Signed)
   02/13/23 1210  Vitals  Temp 98.1 F (36.7 C)  Pulse Rate 87  Resp 11  BP 103/84  SpO2 98 %  Post Treatment  Dialyzer Clearance Lightly streaked  Duration of HD Treatment -hour(s) 3.5 hour(s)  Hemodialysis Intake (mL) 0 mL  Liters Processed 66.1  Fluid Removed (mL) 0 mL  Tolerated HD Treatment Yes   Received patient in bed to unit.  Alert and oriented.  Informed consent signed and in chart.   TX duration:3.5hrs  Patient tolerated well.  Transported back to the room  Alert, without acute distress.  Hand-off given to patient's nurse.   Access used: [facility] Access issues: none  Total UF removed: 0 Medication(s) given: none   [name] [facility]

## 2023-02-13 NOTE — Progress Notes (Signed)
ANTICOAGULATION CONSULT NOTE - Follow Up Consult  Pharmacy Consult for warfarin Indication: LVAD  Allergies  Allergen Reactions   Metformin And Related Diarrhea    Patient Measurements: Weight: 77.1 kg (169 lb 15.6 oz)  Vital Signs: Temp: 98.1 F (36.7 C) (06/10 1210) Temp Source: Oral (06/10 0715) BP: 102/84 (06/10 1223) Pulse Rate: 86 (06/10 1223)  Labs: Recent Labs    02/11/23 0033 02/12/23 0042 02/13/23 0010  HGB  --  9.8* 9.4*  HCT  --  30.2* 28.6*  PLT  --  144* 136*  LABPROT 21.3* 28.2* 24.4*  INR 1.8* 2.6* 2.2*  CREATININE 5.50* 7.70* 9.62*     Estimated Creatinine Clearance: 8.1 mL/min (A) (by C-G formula based on SCr of 9.62 mg/dL (H)).   Medical History: Past Medical History:  Diagnosis Date   Angina    ASCVD (arteriosclerotic cardiovascular disease)    , Anterior infarction 2005, LAD diagonal bifurcation intervention 03/2004   Automatic implantable cardiac defibrillator -St. 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 Last admission, amiodarone was increased, quinidine added, and continues on cefadroxil for chronic suppression. Admit INR 3.4 at  above goal.    Last 2 admits he has been requiring less warfarin than PTA. Upon review, quinidine can potentially increase warfarin sensitivity (case reports showing mixed impact of interaction). Alo increased amiodarone this admit -LDH 100s stable, INR2.3  PTA dose warfarin 5 mg daily   Goal of Therapy:  INR 2-3 Monitor platelets by anticoagulation protocol: Yes   Plan:  Warfarin 2.5mg  x1 prior to DC then 2.5mg  daily  Check INR on Thur per outpatient     Leota Sauers Pharm.D. CPP, BCPS Clinical Pharmacist [phone] 02/13/2023 2:22 PM   **Pharmacist phone directory can now be found on [email or web address] ([email or web address]).  Listed under [facility].

## 2023-02-13 NOTE — Progress Notes (Signed)
LVAD Coordinator Rounding Note:  Admitted 02/04/23 due to AMS, hypotension, and hypoglycemia.   HM 3 LVAD implanted on 03/01/18 by Dr Donata Clay under destination therapy criteria.  CT head 02/03/23: No evidence of acute intracranial abnormality.   CT chest/abd/pelvis 02/03/23: 1. Diffuse bladder wall thickening with some inflammatory changes about the urinary bladder. This is concerning for cystitis. 2. Stable 5 mm nodule in the left upper lobe since the February exam. It has increased proximally 1 cm in average axial dimension since 2019. No follow-up needed if patient is low-risk (and has no known or suspected primary neoplasm). Non-contrast chest CT can be considered in 12 months if patient is high-risk. This recommendation follows the consensus statement: Guidelines for Management of Incidental Pulmonary Nodules Detected on CT Images: From the Fleischner Society 2017; Radiology 2017; 284:228-243. 3. The superior segment nodule in the right lower lobe nodule is obscured by airspace disease. 4. Cardiomegaly with left ventricular assist device and left chest ICD. 5. Coronary artery disease. 6. Small paraumbilical hernia contains fat without bowel. 7.  Aortic Atherosclerosis (ICD10-I70.0)   No VT noted over the weekend. Pt currently in HD- plan to keep even today per nephrology. Saw pt in HD. Denies complaints.   Palliative care GOC meeting completed 6/6. See their note for details.    Completed course of antibiotics for suspected cystitis. Now on chronic suppressive Cefadroxil.   Vital signs: Temp: 97.1 HR: 86 paced Doppler Pressure: 84 Automatic BP: 103/63 (77) O2 Sat: 100% on 3 L [place] Wt: 172.6>167.3>169.1.164.2>169.9 lbs    LVAD interrogation reveals:  Speed: 5300 Flow: 4.1 Power: 3.7 w PI: 4.3  Alarms: none Events: in HD   Hematocrit: 20- DO NOT TITRATE  Fixed speed: 5300 Low speed limit: 5000  Drive Line: Dressing clean, dry, and intact. Anchor correctly applied. Weekly  dressing changes using daily kit. Next dressing change due 02/16/23 per bedside RN.   Labs:  LDH trend: 134>219>139>142>185  INR trend: 3.3>3.2>3.4>3.3>2.9>2.2  Anticoagulation Plan: -INR Goal: 2.0 - 3.0 -ASA Dose: 81 mg daily  Device: -St Jude -Therapies: VF 240          VT 200   Arrythmias: hx of recurrent VT. Started on Quinidine 01/27/23.   Infection:  02/03/23>>blood cultures>> no growth x 5 days; final  Adverse Events on VAD: -Recent admission for VT. Started on Quinidine.   Plan/Recommendations:  1. Page VAD coordinator for any equipment or drive line issues 2. Weekly drive line dressing changes per bedside RN  Alyce Pagan RN VAD Coordinator  Office: [phone]  24/7 Pager: [phone]

## 2023-02-13 NOTE — Progress Notes (Signed)
[facility] Progress Note  Subjective: no new c/o's. Bed wt today was 77.6kg  Vitals:   02/13/23 1000 02/13/23 1030 02/13/23 1059 02/13/23 1133  BP: 103/63 97/79 (!) 83/69 105/89  Pulse: 85 86 86 86  Resp: 17 (!) 23 (!) 22 (!) 21  Temp:      TempSrc:      SpO2: 100% 100% 100% 100%  Weight:        Exam: Gen: NAD CVS: mechanical hum Resp: CTA Abd: +BS, soft, NT/ND Ext: s/p B/L AKA R [facility] TDC intact    Home meds: hydralazine 25mg  tid on non hd days, colace, insulin glargine, atiavn prn, reglan 10 tid, renavite, percocet, warfarin, cefadroxil 1 gm post HD mwf, quinidine 20 bid, amiodarone 400 bid, aspirin, lipitor, celexa, midodrine 10mg  pre hd mwf, protonix, renvela 800mg  ac tid, prns/ vits/ supps   OP HD:  MWF [facility]  4h  400/1.5  77kg  2/2 bath  P4  Heparin 7000   [facility] TDC - last OP HD today 5/31, had 45 min HD, BP's dropped and pt was rinsed back - low IDWG's - hectorol 5 mcg IV tiw - sensipar 180 mg po tiw - mircera 60 mcg IV q 2 wks, last dose on 5/22, due 6/05       Assessment/ Plan: Hypotension/ gen'd weakness - LVAD patient on HD. Had shortened outpt HD session due to low bp's the day of admission. In ED echo showed worsening R HF. IVF's were given. SBP's since have improved up to 90s- 110 range. Refractory VT - recent admit for persistent VT, rx'd with amiodarone + mexiletine initially w/o success then was transitioned to amio+ quinidine and VT stabilized.  Having slow VT here again off and on, per CHF team.  Keep K>4 and Mg >2.  Severe ischemic cardiomyopathy/congestive heart failure with reduced ejection fraction - s/p HeartMate 3 LVAD and ongoing anticoagulation with Coumadin and blood pressure support with midodrine.  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 ESRD: usual HD MWF. Next HD Monday 1st shift.  Volume: weights dropped to below edw by 4kg, per CHF team this is lower than usual and may be  causing problems w/ hypotension and arrhythmias on dialysis. Keeping even w/ HD today. Today's wts are up to 77-78kg, this is his dry wt.  Anemia: Hb 9.9, next esa due on 6/05. Follow.  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 Cont IV vdra and po sensipar.  Chronic hypotension: cont midodrine 10mg  3x per wk pre HD Dispo - possible dc today if stable on dialysis   Rob Arlean Hopping MD [facility] 02/13/2023, 11:46 AM  Recent Labs  Lab 02/08/23 0916 02/09/23 0017 02/10/23 0837 02/10/23 1356 02/12/23 0042 02/13/23 0010  HGB  --    < >  --   --  9.8* 9.4*  ALBUMIN 3.0*  --  2.9*  --   --   --   CALCIUM 8.4*   < > 8.7*   < > 9.0 9.1  PHOS  --   --  2.1*  --   --   --   CREATININE 8.24*   < > 5.66*   < > 7.70* 9.62*  K 3.7   < > 3.7   < > 4.5 4.9   < > = values in this interval not displayed.    No results for input(s): "IRON", "TIBC", "FERRITIN" in the last 168 hours. Inpatient medications:  amiodarone  400 mg  Oral BID   aspirin EC  81 mg Oral Daily   atorvastatin  40 mg Oral q1800   cefadroxil  1,000 mg Oral Q M,W,F-1800   Chlorhexidine Gluconate Cloth  6 each Topical Q0600   Chlorhexidine Gluconate Cloth  6 each Topical Q0600   cinacalcet  180 mg Oral Q M,W,F-HD   citalopram  20 mg Oral Daily   doxercalciferol  5 mcg Intravenous Q M,W,F-HD   Gerhardt's butt cream   Topical BID   loratadine  10 mg Oral Daily   metoCLOPramide  5 mg Oral TID AC & HS   midodrine  10 mg Oral Q M,W,F   pantoprazole  40 mg Oral BID AC   quiNIDine sulfate  400 mg Oral BID   sevelamer carbonate  0.8 g Oral TID WC   Warfarin - Pharmacist Dosing Inpatient   Does not apply q1600    sodium chloride     promethazine (PHENERGAN) injection (IM or IVPB) Stopped (02/09/23 2321)   acetaminophen, [START ON 02/14/2023] heparin, ondansetron (ZOFRAN) IV, promethazine (PHENERGAN) injection (IM or IVPB), promethazine

## 2023-02-13 NOTE — Progress Notes (Addendum)
Patient ID: [name], male   DOB: [birth year], 57 y.o.   MRN: [id number]   Advanced Heart Failure VAD Team Note  PCP-Cardiologist: None   Subjective:    Had VT 6/5 and again 6/7 in HD.  They have been taking more fluid off here than normal.  VT has been slow. EP able pace him out 6/7.  Plan to keep even today in HD per nephrology.   Feels fine this morning, no complaints. Heading to HD.   LVAD INTERROGATION:  HeartMate III LVAD:   Flow 4.3 liters/min, speed 5300, power 4.2, PI 3.3  Objective:    Vital Signs:   Temp:  [97.6 F (36.4 C)-99.6 F (37.6 C)] 98.5 F (36.9 C) (06/10 0715) Pulse Rate:  [86-96] 86 (06/10 0715) Resp:  [14-21] 17 (06/10 0715) BP: (88-128)/(67-89) 100/70 (06/10 0715) SpO2:  [97 %-100 %] 99 % (06/10 0715) Weight:  [80 kg] 80 kg (06/10 0525) Last BM Date : 02/11/23 Mean arterial Pressure  80s  Intake/Output:   Intake/Output Summary (Last 24 hours) at 02/13/2023 0748 Last data filed at 02/12/2023 2310 Gross per 24 hour  Intake 0 ml  Output --  Net 0 ml     Physical Exam  General:  Well appearing. No resp difficulty HEENT: Normal Neck: supple. JVP ~8. Carotids 2+ bilat; no bruits. No lymphadenopathy or thyromegaly appreciated. Cor: Mechanical heart sounds with LVAD hum present. Lungs: Clear Abdomen: soft, nontender, nondistended. No hepatosplenomegaly. No bruits or masses. Good bowel sounds. Driveline: C/D/I; securement device intact and driveline incorporated Extremities: no cyanosis, clubbing, rash, edema. Bilateral AKAs Neuro: alert & oriented x3, cranial nerves grossly intact. moves all 4 extremities w/o difficulty. Affect pleasant  Telemetry   Paced 80s (Personally reviewed)    EKG    AV paced 80s  Labs   Basic Metabolic Panel: Recent Labs  Lab 02/09/23 0017 02/10/23 0837 02/10/23 1356 02/11/23 0033 02/12/23 0042 02/13/23 0010  NA 131* 134* 133* 130* 131* 131*  K 3.9 3.7 3.7 4.3 4.5 4.9  CL 96* 97* 93* 95* 94* 95*   CO2 26 26 25 22  20* 19*  GLUCOSE 108* 106* 157* 140* 102* 98  BUN 13 15 13 18  32* 46*  CREATININE 5.33* 5.66* 4.19* 5.50* 7.70* 9.62*  CALCIUM 8.4* 8.7* 8.9 8.9 9.0 9.1  MG 1.8 1.7  --  2.8* 2.6* 2.6*  PHOS  --  2.1*  --   --   --   --    Liver Function Tests: Recent Labs  Lab 02/08/23 0916 02/10/23 0837  AST 34  --   ALT 26  --   ALKPHOS 87  --   BILITOT 0.6  --   PROT 6.7  --   ALBUMIN 3.0* 2.9*   No results for input(s): "LIPASE", "AMYLASE" in the last 168 hours. No results for input(s): "AMMONIA" in the last 168 hours.  CBC: Recent Labs  Lab 02/07/23 0040 02/08/23 0915 02/10/23 0800 02/12/23 0042 02/13/23 0010  WBC 6.8 7.1 8.2 11.0* 13.0*  HGB 9.9* 9.2* 9.3* 9.8* 9.4*  HCT 31.3* 28.5* 29.1* 30.2* 28.6*  MCV 89.2 86.6 88.7 86.0 84.1  PLT 125* 143* 135* 144* 136*   INR: Recent Labs  Lab 02/09/23 0017 02/10/23 0836 02/11/23 0033 02/12/23 0042 02/13/23 0010  INR 3.3* 2.9* 1.8* 2.6* 2.2*   Other results:   Imaging   No results found.  Medications:   Scheduled Medications:  amiodarone  400 mg Oral TID   aspirin EC  81 mg Oral Daily   atorvastatin  40 mg Oral q1800   cefadroxil  1,000 mg Oral Q M,W,F-1800   Chlorhexidine Gluconate Cloth  6 each Topical Q0600   Chlorhexidine Gluconate Cloth  6 each Topical Q0600   cinacalcet  180 mg Oral Q M,W,F-HD   citalopram  20 mg Oral Daily   doxercalciferol  5 mcg Intravenous Q M,W,F-HD   Gerhardt's butt cream   Topical BID   [START ON 02/14/2023] heparin  5,000 Units Dialysis Once in dialysis   loratadine  10 mg Oral Daily   metoCLOPramide  5 mg Oral TID AC & HS   midodrine  10 mg Oral Q M,W,F   pantoprazole  40 mg Oral BID AC   quiNIDine sulfate  400 mg Oral BID   sevelamer carbonate  0.8 g Oral TID WC   Warfarin - Pharmacist Dosing Inpatient   Does not apply q1600   Infusions:  sodium chloride     promethazine (PHENERGAN) injection (IM or IVPB) Stopped (02/09/23 2321)    PRN  Medications: acetaminophen, [START ON 02/14/2023] heparin, ondansetron (ZOFRAN) IV, promethazine (PHENERGAN) injection (IM or IVPB), promethazine  Patient Profile   57 y.o. with history of CAD s/p anterior MI in 2010 and NSTEMI in 3/18 with DES to pRCA and mLAD and ischemic cardiomyopathy s/p Heartmate 3 LVAD in 2018 and ESRD on HD, recent admit for refractory VT. He also had hx of ischemic lower extremities s/p bilateral AKAs. Readmitted 5/31 for lethargy and AMS. AHF team primary with VAD.   Assessment/Plan:   1. Lethargy/AMS  - CT scan with diffuse bladder wall thickening consistent with cystitis; otherwise work up unremarkable. Symptoms possibly secondary to recurrent hypoglycemia also.  -Treated with antibiotics.  - resolved   2. VT storm - recent admit for refractory VT, failed multiple ATPs. EP consulted. Device adjusted>>ATP in ED with conversion. Placed on oral amiodarone + quinidine w/ suppression of VT.  Suspect increased VT is due to worsening RV failure.  - EP following. - quinidine increased 6/4 and again 6/7, now on 400 mg bid.  - Prolonged episode of VT 6/1 lasting greater than 30 minutes.  No effects on LVAD hemodynamics.  Received amiodarone bolus.   - refractory VT 6/5 while in HD, amiodarone increased to 60mg /hr. - VT again at HD 6/7.  - VT has been slow and well-tolerated.  - palliative team saw, plan to continue current treatment.  - Pulling more fluid off him at HD than at home.  Would keep dry weight 78 kg, may help with VT avoidance. Per nephrology plan to keep even today.  - Transition amiodarone 400mg  TID> BID today. (Lost IV access over the weekend, higher amio dose to complete load)   3. HFrEF, s/p HMIII, ICM  St Jude ICD.  NSTEMI in March 2018 with DES to LAD and RCA, complicated by cardiogenic shock, low output requiring milrinone.  Echo 8/18 with EF 20-25%, moderate MR. CPX (8/18) with severe functional impairment due to HF.  He is s/p HM3 LVAD placement. He  had pump thrombosis 6/19 due to [facility] outflow graft kinking.  He had pump exchange for another HM3.  LVAD parameters stable but he does have PI events and low flow events at times with HD. He takes midodrine pre-HD, but needs hydralazine on non-HD days.  - Had Ramp Echo 5/21. Speed decreased to 5300 - Echo this admission with midline septum, severe RV dysfunction.  - Volume managed per HD. Appreciated nephrology's assistance  -  Continue midodrine 10 mg tid.  - On warfarin.   4. Driveline infection, chronic  - Continue cefadroxil long term     5. ESRD  - iHD M-W-F  - nephrology following - As above, would keep dry weight at 78 kg, VT during HD may be triggered by pulling off excess fluid.    6. PAD - S/P bilateral AKA  If he tolerates HD fine today may be stable for discharge. Will arrange f/u in VAD clinic.   I reviewed the LVAD parameters from today, and compared the results to the patient's prior recorded data.  No programming changes were made.  The LVAD is functioning within specified parameters.  The patient performs LVAD self-test daily.  LVAD interrogation was negative for any significant power changes, alarms or PI events/speed drops.  LVAD equipment check completed and is in good working order.  Back-up equipment present.   LVAD education done on emergency procedures and precautions and reviewed exit site care.  Length of Stay: 10  Alen Bleacher 02/13/2023 7:48 AM  Patient seen and examined with the above-signed Advanced Practice Provider and/or Housestaff. I personally reviewed laboratory data, imaging studies and relevant notes. I independently examined the patient and formulated the important aspects of the plan. I have edited the note to reflect any of my changes or salient points. I have personally discussed the plan with the patient and/or family.  No further VT. In HD now. Denies CP or SOB. Eager to go home   General:  NAD.  HEENT: normal  Neck: supple. JVP not elevated.   Carotids 2+ bilat; no bruits. No lymphadenopathy or thryomegaly appreciated. Cor: LVAD hum.  Lungs: Clear. Abdomen: obese soft, nontender, non-distended. No hepatosplenomegaly. No bruits or masses. Good bowel sounds. Driveline site clean. Anchor in place.  Extremities: no cyanosis, clubbing, rash. S/p B AKA Neuro: alert & oriented x 3. No focal deficits. Moves all 4 without problem   Plan to keep even in HD today. If no further VT likely home today. Decrease amio to 200 bid.  VAD interrogated personally. Parameters stable.  INR 2.2 Discussed dosing with PharmD personally.  Arvilla Meres, MD  8:36 AM

## 2023-02-13 NOTE — TOC Progression Note (Signed)
Transition of Care [facility]) - Progression Note    Patient Details  Name: [name] MRN: [id number] Date of Birth: 27-Apr-1966  [facility]) CM/SW Contact  Elliot Cousin, RN Phone Number: [phone] 02/13/2023, 3:59 PM  Clinical Narrative:   Son provided transportation home. PCP appt scheduled for 6/14 at 2:10 pm. Patient has needed DME in the home.    Expected Discharge Plan: Home/Self Care Barriers to Discharge: Continued Medical Work up  Expected Discharge Plan and Services   Discharge Planning Services: CM Consult Post Acute Care Choice: Home Health Living arrangements for the past 2 months: Single Family Home Expected Discharge Date: 02/13/23                                     Social Determinants of Health (SDOH) Interventions SDOH Screenings   Food Insecurity: No Food Insecurity (12/06/2022)  Housing: Low Risk  (12/06/2022)  Transportation Needs: No Transportation Needs (11/19/2019)  Utilities: Not At Risk (12/06/2022)  Alcohol Screen: Low Risk  (12/06/2022)  Depression (PHQ2-9): Low Risk  (12/06/2022)  Financial Resource Strain: Low Risk  (12/06/2022)  Physical Activity: Inactive (12/06/2022)  Social Connections: Socially Integrated (12/06/2022)  Stress: No Stress Concern Present (12/06/2022)  Tobacco Use: Low Risk  (02/03/2023)    Readmission Risk Interventions    02/07/2023    9:06 AM  Readmission Risk Prevention Plan  Transportation Screening Complete

## 2023-02-13 NOTE — TOC Progression Note (Signed)
Transition of Care [facility]) - Progression Note    Patient Details  Name: [name] MRN: [id number] Date of Birth: 01/13/1966  Transition of Care [facility]) CM/SW Contact  Elliot Cousin, RN Phone Number: [phone] 02/13/2023, 3:53 PM  Clinical Narrative:   Readmission prevention completed. Appt scheduled with PCP on 6/14 at 2:10 pm. Son to provided transportation home.     Expected Discharge Plan: Home/Self Care Barriers to Discharge: No Barriers Identified  Expected Discharge Plan and Services   Discharge Planning Services: CM Consult   Living arrangements for the past 2 months: Single Family Home Expected Discharge Date: 01/30/23                                     Social Determinants of Health (SDOH) Interventions SDOH Screenings   Food Insecurity: No Food Insecurity (12/06/2022)  Housing: Low Risk  (12/06/2022)  Transportation Needs: No Transportation Needs (11/19/2019)  Utilities: Not At Risk (12/06/2022)  Alcohol Screen: Low Risk  (12/06/2022)  Depression (PHQ2-9): Low Risk  (12/06/2022)  Financial Resource Strain: Low Risk  (12/06/2022)  Physical Activity: Inactive (12/06/2022)  Social Connections: Socially Integrated (12/06/2022)  Stress: No Stress Concern Present (12/06/2022)  Tobacco Use: Low Risk  (02/03/2023)    Readmission Risk Interventions    02/07/2023    9:06 AM  Readmission Risk Prevention Plan  Transportation Screening Complete

## 2023-02-13 NOTE — Progress Notes (Signed)
D/C order noted. Contacted [facility] to advise clinic of pt's d/c today and that pt should resume care on Wednesday.   Olivia Canter Renal Navigator [phone]

## 2023-02-14 ENCOUNTER — Telehealth: Payer: Self-pay

## 2023-02-14 NOTE — Plan of Care (Signed)
[facility] Patient Discharge Orders- [facility] CLINIC: [facility]  Patient's name: Adolph Pollack Admit/DC Dates: 02/03/2023 - 02/13/2023  Discharge Diagnoses: VT storm    Lethargy/AMS 2/2 # 1 vs cystitis vs hypoglycemia  Aranesp: Given: no   Last Hgb: 9.4 PRBC's Given: no Date/# of units: n/a ESA dose for discharge: mircera 60 mcg IV q 2 weeks - next dose on 02/15/23 IV Iron dose at discharge: none  Heparin change: no  EDW Change: yes New EDW: 78kg  Bath Change: no  Access intervention/Change: no Details:  Hectorol/Calcitriol change: no  Discharge Labs: Calcium9.1 Phosphorus 2.1  Albumin 2.9 K+ 4.9  ONSP - Yes  IV Antibiotics: no Details:  On Coumadin?: yes Last INR: 2.2 on 6/10 Next INR: per HF Managed By: HF   OTHER/APPTS/LAB ORDERS:    D/C Meds to be reconciled by nurse after every discharge.  Completed By:   Reviewed by: MD:______ RN_______

## 2023-02-14 NOTE — Transitions of Care (Post Inpatient/ED Visit) (Signed)
02/14/2023  Name: [name] MRN: [id number] DOB: Jul 26, 1966  Today's TOC FU Call Status: Today's TOC FU Call Status:: Successful TOC FU Call Competed TOC FU Call Complete Date: 02/14/23  Transition Care Management Follow-up Telephone Call Date of Discharge: 02/13/23 Discharge Facility: [facility]) Type of Discharge: Inpatient Admission Primary Inpatient Discharge Diagnosis:: altered mental status How have you been since you were released from the hospital?: Better Any questions or concerns?: No  Items Reviewed: Did you receive and understand the discharge instructions provided?: Yes Medications obtained,verified, and reconciled?: Yes (Medications Reviewed) Any new allergies since your discharge?: No Dietary orders reviewed?: Yes Do you have support at home?: Yes People in Home: spouse  Medications Reviewed Today: Medications Reviewed Today     Reviewed by Karena Addison, LPN (Licensed Practical Nurse) on 02/14/23 at 0911  Med List Status: <None>   Medication Order Taking? Sig Documenting Provider Last Dose Status Informant  amiodarone (PACERONE) 200 MG tablet [id number] Yes Take 2 tabets (400mg  total) twice daily for 7 days, then decrease dose to 1 tablet (200mg  total) twice daily Bensimhon, Bevelyn Buckles, MD Taking Active   aspirin EC 81 MG tablet [id number] Yes Take 1 tablet (81 mg total) by mouth daily. Janetta Hora, PA-C Taking Active Child  atorvastatin (LIPITOR) 40 MG tablet [id number] Yes Take 1 tablet (40 mg total) by mouth daily at 6 PM.  Patient taking differently: Take 40 mg by mouth every evening.   Leeroy Bock, MD Taking Active Child  calcitRIOL (ROCALTROL) 0.5 MCG capsule [id number] Yes Take 1 capsule (0.5 mcg total) by mouth every Monday, Wednesday, and Friday.  Patient taking differently: Take 0.5 mcg by mouth See admin instructions. Take one tablet by mouth on Monday, Wednesdays and Fridays per daughter   Alford Highland, NP Taking Active  Child           Med Note Antony Madura, Arn Medal   Tue Aug 28, 2018  6:42 PM)    cefadroxil (DURICEF) 500 MG capsule [id number] Yes Take 2 capsules (1,000 mg total) by mouth 3 (three) times a week. Take AFTER dialysis sessions  Patient taking differently: Take 1,000 mg by mouth 3 (three) times a week. Take AFTER dialysis sessions on Monday, Wednesday and Fridays only   Blanchard Kelch, NP Taking Active Child  cinacalcet (SENSIPAR) 30 MG tablet [id number] Yes Take 6 tablets (180 mg total) by mouth every Monday, Wednesday, and Friday with hemodialysis. Alen Bleacher, NP Taking Active   citalopram (CELEXA) 20 MG tablet [id number] Yes Take 1 tablet (20 mg total) by mouth daily. Alford Highland, NP Taking Active Child           Med Note Antony Madura, KATHY N   Tue Aug 28, 2018  6:42 PM)    COLACE 100 MG capsule [id number] Yes Take 100 mg by mouth daily as needed for moderate constipation. [provider] Taking Active Child  fluticasone (FLONASE) 50 MCG/ACT nasal spray [id number] Yes Place 1 spray into both nostrils daily. Omar Person, MD Taking Active Child  insulin glargine (LANTUS) 100 UNIT/ML injection [id number] Yes Inject 0.35 mLs (35 Units total) into the skin every morning.  Patient taking differently: Inject 35 Units into the skin at bedtime.   Bess Kinds, MD Taking Active Child  levocetirizine (XYZAL) 5 MG tablet [id number] Yes Take 1 tablet (5 mg total) by mouth every evening. Omar Person, MD Taking Active Child  loratadine (CLARITIN) 10 MG tablet [id number] Yes Take 1  tablet (10 mg total) by mouth daily. Alen Bleacher, NP Taking Active   LORazepam (ATIVAN) 0.5 MG tablet [id number] Yes Take 1 tablet (0.5 mg total) by mouth every 12 (twelve) hours as needed (nausea). [name] M, PA-C Taking Active Child  metoCLOPramide (REGLAN) 5 MG tablet [id number] Yes Take 1 tablet (5 mg total) by mouth 4 (four) times daily -  before meals and at bedtime. Alen Bleacher, NP Taking Active    midodrine (PROAMATINE) 5 MG tablet [id number] Yes Take 2 tablets (10 mg total) by mouth every Monday, Wednesday, and Friday. Take 1 tablet before dialysis treatment Laurey Morale, MD Taking Active Child  multivitamin (RENA-VIT) TABS tablet [id number] Yes Take 1 tablet by mouth daily. [provider] Taking Active Child  ondansetron (ZOFRAN-ODT) 4 MG disintegrating tablet [id number] Yes Take 4 mg by mouth every 8 (eight) hours as needed for nausea or vomiting. [provider] Taking Active Child  oxyCODONE-acetaminophen (PERCOCET) 7.5-325 MG tablet [id number] Yes Take 1 tablet by mouth 3 (three) times daily as needed for pain. [provider] Taking Active Child  pantoprazole (PROTONIX) 40 MG tablet [id number] Yes Take 1 tablet (40 mg total) by mouth 2 (two) times daily before lunch and supper. Barrett, Joline Salt, PA-C Taking Active Child  promethazine (PHENERGAN) 12.5 MG tablet [id number] Yes TAKE 1 TABLET (12.5 MG TOTAL) BY MOUTH EVERY 6 (SIX) HOURS AS NEEDED FOR NAUSEA OR VOMITING. Laurey Morale, MD Taking Active Child  quiNIDine sulfate 200 MG tablet [id number] Yes Take 2 tablets (400 mg total) by mouth 2 (two) times daily. Alen Bleacher, NP Taking Active   sevelamer carbonate (RENVELA) 0.8 g PACK packet [id number] Yes Take 0.8 g by mouth 3 (three) times daily with meals. Manson Passey, PA Taking Active Child           Med Note (SATTERFIELD, Genoveva Ill   Fri Feb 03, 2023  4:01 PM) Patient daughter states he is still taking this medication   triamcinolone ointment (KENALOG) 0.1 % [id number] Yes Apply 1 Application topically 2 (two) times daily.  Patient taking differently: Apply 1 Application topically daily as needed (For rash).   Bess Kinds, MD Taking Active Child           Med Note (SATTERFIELD, Genoveva Ill   Fri Feb 03, 2023  4:01 PM) Still has on hand   warfarin (COUMADIN) 5 MG tablet [id number] Yes Take 0.5 tablets (2.5 mg total) by mouth daily. Bensimhon, Bevelyn Buckles, MD Taking Active             Home Care and Equipment/Supplies: Were Home Health Services Ordered?: NA Any new equipment or medical supplies ordered?: NA  Functional Questionnaire: Do you need assistance with bathing/showering or dressing?: No Do you need assistance with meal preparation?: No Do you need assistance with eating?: No Do you have difficulty maintaining continence: No Do you need assistance with getting out of bed/getting out of a chair/moving?: No Do you have difficulty managing or taking your medications?: No  Follow up appointments reviewed: PCP Follow-up appointment confirmed?: Yes Date of PCP follow-up appointment?: 02/17/23 Follow-up Provider: Dr Yetta Barre [facility] Follow-up appointment confirmed?: Yes Date of Specialist follow-up appointment?:  Follow-Up Specialty Provider:: Dr Shirlee Latch Do you need transportation to your follow-up appointment?: No Do you understand care options if your condition(s) worsen?: Yes-patient verbalized understanding    SIGNATURE Karena Addison, LPN [facility] Nurse Health Advisor Direct Dial [phone]

## 2023-02-16 ENCOUNTER — Ambulatory Visit (HOSPITAL_COMMUNITY): Payer: Self-pay | Admitting: Pharmacist

## 2023-02-16 LAB — POCT INR: INR: 3.5 — AB (ref 2.0–3.0)

## 2023-02-16 NOTE — Progress Notes (Signed)
VAD Coordinator paged by patient and his daughter [name] this evening report patient was just shocked by his ICD. Patient is awake and alert only complaining of generalized fatigue. Patient denies alarms on VAD. Parameters stable. Speed: 5350 Flow: 4.2 Power: 4.0w PI: 4.1. Patient endorses he has not missed any doses of medication and confirmed he is taking 400mg  of Amiodarone BID and 400mg  of Quinidine BID. Discussed with Dr. Gala Romney. Will plan to send a remote transmission tonight for review. Patient scheduled for HD in the morning. Advised patient has recurrent shocks or alarming on his VAD to notify VAD Coordinator.   Simmie Davies RN, BSN VAD Coordinator 24/7 Pager [phone]

## 2023-02-17 ENCOUNTER — Ambulatory Visit: Payer: Medicare Other | Admitting: Student

## 2023-02-17 ENCOUNTER — Other Ambulatory Visit: Payer: Self-pay

## 2023-02-17 NOTE — Progress Notes (Deleted)
    SUBJECTIVE:   CHIEF COMPLAINT / HPI:   *** Patient hospitalized from 5/21-6/06/2023 due to AMS, hypotension, and lethargy during HD session.  HFrEF -Echo on 6/7 showed worsening RV failure with EF less than 20%.  Goal dry weight 78 kg.  Refractory VT -Refractory VT throughout hospitalization, treated with amiodarone and EP increase quinidine.  Not a candidate for ablation.  VT improved after multiple sessions of HD and thought to be related to fluid overload in setting of worsening heart failure. -Discharged on quinidine 400 mg twice daily, amiodarone 400 mg twice daily for 7 days and then decrease to 200 mg twice daily (June 17), warfarin daily-INR yesterday 3.5 at anticoag clinic. -Next cardiology appointment scheduled for 02/28/2023  Goals of care -Palliative was consulted with plan to continue current treatments  ESRD Nephrology followed and patient continued HD during hospitalization with improvement in symptoms.   PERTINENT  PMH / PSH: CAD s/p anterior MI in 2010, NSTEMI, s/p LVAD in 2018, ESRD on HD OBJECTIVE:   There were no vitals taken for this visit. ***  General: NAD, pleasant, able to participate in exam Cardiac: RRR, no murmurs. Respiratory: CTAB, normal effort, No wheezes, rales or rhonchi Abdomen: Bowel sounds present, nontender, nondistended, no hepatosplenomegaly. Extremities: no edema or cyanosis. Skin: warm and dry, no rashes noted Neuro: alert, no obvious focal deficits Psych: Normal affect and mood  ASSESSMENT/PLAN:   No problem-specific Assessment & Plan notes found for this encounter.     Dr. Erick Alley, DO [facility]    {    This will disappear when note is signed, click to select method of visit    :1}

## 2023-02-17 NOTE — Progress Notes (Signed)
Remote transmission reviewed. No episodes of VT/VF noted since 6/7. Discussed with daughter [name] she states patient was on and off asleep when he said episode occured and that he stated "he was anticipating it". VAD Coordinator again expressed importance of notifying VAD team if he has recurrent shocks or alarming on his VAD.  Simmie Davies RN, BSN VAD Coordinator 24/7 Pager [phone]

## 2023-02-20 ENCOUNTER — Telehealth: Payer: Self-pay

## 2023-02-20 ENCOUNTER — Inpatient Hospital Stay (HOSPITAL_COMMUNITY)
Admission: EM | Admit: 2023-02-20 | Discharge: 2023-03-06 | DRG: 308 | Disposition: E | Payer: Medicare Other | Attending: Internal Medicine | Admitting: Internal Medicine

## 2023-02-20 ENCOUNTER — Emergency Department (HOSPITAL_COMMUNITY): Payer: Medicare Other

## 2023-02-20 ENCOUNTER — Other Ambulatory Visit: Payer: Self-pay

## 2023-02-20 ENCOUNTER — Encounter (HOSPITAL_COMMUNITY): Payer: Self-pay

## 2023-02-20 ENCOUNTER — Encounter (HOSPITAL_COMMUNITY): Payer: Medicare Other

## 2023-02-20 DIAGNOSIS — Z89612 Acquired absence of left leg above knee: Secondary | ICD-10-CM

## 2023-02-20 DIAGNOSIS — Z833 Family history of diabetes mellitus: Secondary | ICD-10-CM

## 2023-02-20 DIAGNOSIS — Z7189 Other specified counseling: Secondary | ICD-10-CM | POA: Diagnosis not present

## 2023-02-20 DIAGNOSIS — Z992 Dependence on renal dialysis: Secondary | ICD-10-CM

## 2023-02-20 DIAGNOSIS — I5082 Biventricular heart failure: Secondary | ICD-10-CM | POA: Diagnosis present

## 2023-02-20 DIAGNOSIS — Z66 Do not resuscitate: Secondary | ICD-10-CM | POA: Diagnosis not present

## 2023-02-20 DIAGNOSIS — Z9049 Acquired absence of other specified parts of digestive tract: Secondary | ICD-10-CM

## 2023-02-20 DIAGNOSIS — R69 Illness, unspecified: Secondary | ICD-10-CM

## 2023-02-20 DIAGNOSIS — Z7982 Long term (current) use of aspirin: Secondary | ICD-10-CM

## 2023-02-20 DIAGNOSIS — I739 Peripheral vascular disease, unspecified: Secondary | ICD-10-CM | POA: Diagnosis present

## 2023-02-20 DIAGNOSIS — Z8249 Family history of ischemic heart disease and other diseases of the circulatory system: Secondary | ICD-10-CM

## 2023-02-20 DIAGNOSIS — N186 End stage renal disease: Secondary | ICD-10-CM | POA: Diagnosis present

## 2023-02-20 DIAGNOSIS — I255 Ischemic cardiomyopathy: Secondary | ICD-10-CM | POA: Diagnosis present

## 2023-02-20 DIAGNOSIS — I2510 Atherosclerotic heart disease of native coronary artery without angina pectoris: Secondary | ICD-10-CM | POA: Diagnosis present

## 2023-02-20 DIAGNOSIS — I252 Old myocardial infarction: Secondary | ICD-10-CM

## 2023-02-20 DIAGNOSIS — R34 Anuria and oliguria: Secondary | ICD-10-CM | POA: Diagnosis not present

## 2023-02-20 DIAGNOSIS — E1122 Type 2 diabetes mellitus with diabetic chronic kidney disease: Secondary | ICD-10-CM | POA: Diagnosis present

## 2023-02-20 DIAGNOSIS — Z789 Other specified health status: Secondary | ICD-10-CM | POA: Diagnosis not present

## 2023-02-20 DIAGNOSIS — Z95810 Presence of automatic (implantable) cardiac defibrillator: Secondary | ICD-10-CM

## 2023-02-20 DIAGNOSIS — Z79899 Other long term (current) drug therapy: Secondary | ICD-10-CM | POA: Diagnosis not present

## 2023-02-20 DIAGNOSIS — Z95811 Presence of heart assist device: Secondary | ICD-10-CM

## 2023-02-20 DIAGNOSIS — Z794 Long term (current) use of insulin: Secondary | ICD-10-CM | POA: Diagnosis not present

## 2023-02-20 DIAGNOSIS — I132 Hypertensive heart and chronic kidney disease with heart failure and with stage 5 chronic kidney disease, or end stage renal disease: Secondary | ICD-10-CM | POA: Diagnosis present

## 2023-02-20 DIAGNOSIS — E1151 Type 2 diabetes mellitus with diabetic peripheral angiopathy without gangrene: Secondary | ICD-10-CM | POA: Diagnosis present

## 2023-02-20 DIAGNOSIS — Z86718 Personal history of other venous thrombosis and embolism: Secondary | ICD-10-CM

## 2023-02-20 DIAGNOSIS — I5022 Chronic systolic (congestive) heart failure: Secondary | ICD-10-CM | POA: Diagnosis present

## 2023-02-20 DIAGNOSIS — Z83719 Family history of colon polyps, unspecified: Secondary | ICD-10-CM

## 2023-02-20 DIAGNOSIS — I959 Hypotension, unspecified: Secondary | ICD-10-CM | POA: Diagnosis present

## 2023-02-20 DIAGNOSIS — Z952 Presence of prosthetic heart valve: Secondary | ICD-10-CM

## 2023-02-20 DIAGNOSIS — Z515 Encounter for palliative care: Secondary | ICD-10-CM

## 2023-02-20 DIAGNOSIS — R5383 Other fatigue: Secondary | ICD-10-CM | POA: Diagnosis not present

## 2023-02-20 DIAGNOSIS — Z86010 Personal history of colonic polyps: Secondary | ICD-10-CM

## 2023-02-20 DIAGNOSIS — K5090 Crohn's disease, unspecified, without complications: Secondary | ICD-10-CM | POA: Diagnosis present

## 2023-02-20 DIAGNOSIS — Z89611 Acquired absence of right leg above knee: Secondary | ICD-10-CM

## 2023-02-20 DIAGNOSIS — I5084 End stage heart failure: Secondary | ICD-10-CM | POA: Diagnosis present

## 2023-02-20 DIAGNOSIS — Z823 Family history of stroke: Secondary | ICD-10-CM

## 2023-02-20 DIAGNOSIS — K219 Gastro-esophageal reflux disease without esophagitis: Secondary | ICD-10-CM | POA: Diagnosis present

## 2023-02-20 DIAGNOSIS — Z955 Presence of coronary angioplasty implant and graft: Secondary | ICD-10-CM

## 2023-02-20 DIAGNOSIS — Z5329 Procedure and treatment not carried out because of patient's decision for other reasons: Secondary | ICD-10-CM | POA: Diagnosis present

## 2023-02-20 DIAGNOSIS — I472 Ventricular tachycardia, unspecified: Secondary | ICD-10-CM | POA: Diagnosis present

## 2023-02-20 DIAGNOSIS — Z792 Long term (current) use of antibiotics: Secondary | ICD-10-CM

## 2023-02-20 DIAGNOSIS — E7800 Pure hypercholesterolemia, unspecified: Secondary | ICD-10-CM | POA: Diagnosis present

## 2023-02-20 DIAGNOSIS — Z7901 Long term (current) use of anticoagulants: Secondary | ICD-10-CM

## 2023-02-20 DIAGNOSIS — D631 Anemia in chronic kidney disease: Secondary | ICD-10-CM | POA: Diagnosis present

## 2023-02-20 LAB — CBC WITH DIFFERENTIAL/PLATELET
Abs Immature Granulocytes: 0.12 10*3/uL — ABNORMAL HIGH (ref 0.00–0.07)
Basophils Absolute: 0 10*3/uL (ref 0.0–0.1)
Basophils Relative: 1 %
Eosinophils Absolute: 0.3 10*3/uL (ref 0.0–0.5)
Eosinophils Relative: 3 %
HCT: 27 % — ABNORMAL LOW (ref 39.0–52.0)
Hemoglobin: 9 g/dL — ABNORMAL LOW (ref 13.0–17.0)
Immature Granulocytes: 1 %
Lymphocytes Relative: 11 %
Lymphs Abs: 0.9 10*3/uL (ref 0.7–4.0)
MCH: 27.4 pg (ref 26.0–34.0)
MCHC: 33.3 g/dL (ref 30.0–36.0)
MCV: 82.3 fL (ref 80.0–100.0)
Monocytes Absolute: 0.4 10*3/uL (ref 0.1–1.0)
Monocytes Relative: 5 %
Neutro Abs: 6.7 10*3/uL (ref 1.7–7.7)
Neutrophils Relative %: 79 %
Platelets: 95 10*3/uL — ABNORMAL LOW (ref 150–400)
RBC: 3.28 MIL/uL — ABNORMAL LOW (ref 4.22–5.81)
RDW: 17 % — ABNORMAL HIGH (ref 11.5–15.5)
WBC: 8.5 10*3/uL (ref 4.0–10.5)
nRBC: 0 % (ref 0.0–0.2)

## 2023-02-20 LAB — BASIC METABOLIC PANEL
Anion gap: 16 — ABNORMAL HIGH (ref 5–15)
BUN: 16 mg/dL (ref 6–20)
CO2: 26 mmol/L (ref 22–32)
Calcium: 7.7 mg/dL — ABNORMAL LOW (ref 8.9–10.3)
Chloride: 95 mmol/L — ABNORMAL LOW (ref 98–111)
Creatinine, Ser: 4.35 mg/dL — ABNORMAL HIGH (ref 0.61–1.24)
GFR, Estimated: 15 mL/min — ABNORMAL LOW (ref >60)
Glucose, Bld: 109 mg/dL — ABNORMAL HIGH (ref 70–99)
Potassium: 3.1 mmol/L — ABNORMAL LOW (ref 3.5–5.1)
Sodium: 137 mmol/L (ref 135–145)

## 2023-02-20 LAB — GLUCOSE, CAPILLARY
Glucose-Capillary: 100 mg/dL — ABNORMAL HIGH (ref 70–99)
Glucose-Capillary: 114 mg/dL — ABNORMAL HIGH (ref 70–99)

## 2023-02-20 LAB — TROPONIN I (HIGH SENSITIVITY)
Troponin I (High Sensitivity): 17 ng/L (ref <18)
Troponin I (High Sensitivity): 17 ng/L

## 2023-02-20 LAB — PHOSPHORUS: Phosphorus: 2.1 mg/dL — ABNORMAL LOW (ref 2.5–4.6)

## 2023-02-20 LAB — MAGNESIUM: Magnesium: 1.8 mg/dL (ref 1.7–2.4)

## 2023-02-20 LAB — LACTIC ACID, PLASMA
Lactic Acid, Venous: 1.3 mmol/L (ref 0.5–1.9)
Lactic Acid, Venous: 1.1 mmol/L (ref 0.5–1.9)

## 2023-02-20 LAB — MRSA NEXT GEN BY PCR, NASAL: MRSA by PCR Next Gen: NOT DETECTED

## 2023-02-20 LAB — LACTATE DEHYDROGENASE: LDH: 222 U/L — ABNORMAL HIGH (ref 98–192)

## 2023-02-20 LAB — PROTIME-INR
INR: 1.5 — ABNORMAL HIGH (ref 0.8–1.2)
Prothrombin Time: 18.5 seconds — ABNORMAL HIGH (ref 11.4–15.2)

## 2023-02-20 MED ORDER — PROMETHAZINE HCL 25 MG PO TABS: 12.5000 mg | ORAL_TABLET | Freq: Four times a day (QID) | ORAL | Status: DC | PRN

## 2023-02-20 MED ORDER — AMIODARONE HCL IN DEXTROSE 360-4.14 MG/200ML-% IV SOLN
60.0000 mg/h | INTRAVENOUS | Status: AC
  Administered 2023-02-20 (×2): 60 mg/h via INTRAVENOUS
  Filled 2023-02-20: qty 200

## 2023-02-20 MED ORDER — ACETAMINOPHEN 325 MG PO TABS: 650.0000 mg | ORAL_TABLET | ORAL | Status: DC | PRN

## 2023-02-20 MED ORDER — MAGNESIUM SULFATE IN D5W 1-5 GM/100ML-% IV SOLN
1.0000 g | Freq: Once | INTRAVENOUS | Status: AC
  Administered 2023-02-20: 1 g via INTRAVENOUS
  Filled 2023-02-20: qty 100

## 2023-02-20 MED ORDER — AMIODARONE HCL 200 MG PO TABS: 200.0000 mg | ORAL_TABLET | Freq: Two times a day (BID) | ORAL | Status: DC

## 2023-02-20 MED ORDER — PANTOPRAZOLE SODIUM 40 MG PO TBEC
40.0000 mg | DELAYED_RELEASE_TABLET | Freq: Two times a day (BID) | ORAL | Status: DC
  Administered 2023-02-20: 40 mg via ORAL
  Filled 2023-02-20: qty 1

## 2023-02-20 MED ORDER — WARFARIN - PHARMACIST DOSING INPATIENT: Freq: Every day | Status: DC

## 2023-02-20 MED ORDER — LORAZEPAM 0.5 MG PO TABS: 0.5000 mg | ORAL_TABLET | Freq: Two times a day (BID) | ORAL | Status: DC | PRN

## 2023-02-20 MED ORDER — LIDOCAINE BOLUS VIA INFUSION
100.0000 mg | Freq: Once | INTRAVENOUS | Status: AC
  Administered 2023-02-20: 100 mg via INTRAVENOUS
  Filled 2023-02-20: qty 100

## 2023-02-20 MED ORDER — QUINIDINE SULFATE 200 MG PO TABS
400.0000 mg | ORAL_TABLET | Freq: Two times a day (BID) | ORAL | Status: DC
  Administered 2023-02-20 (×2): 400 mg via ORAL
  Filled 2023-02-20 (×3): qty 2

## 2023-02-20 MED ORDER — AMIODARONE LOAD VIA INFUSION
150.0000 mg | Freq: Once | INTRAVENOUS | Status: AC
  Administered 2023-02-20: 150 mg via INTRAVENOUS
  Filled 2023-02-20: qty 83.34

## 2023-02-20 MED ORDER — SODIUM CHLORIDE 0.9 % IV SOLN
12.5000 mg | Freq: Once | INTRAVENOUS | Status: AC
  Administered 2023-02-20: 12.5 mg via INTRAVENOUS
  Filled 2023-02-20: qty 0.5
  Filled 2023-02-20: qty 12.5

## 2023-02-20 MED ORDER — AMIODARONE HCL 200 MG PO TABS
400.0000 mg | ORAL_TABLET | Freq: Two times a day (BID) | ORAL | Status: DC
  Administered 2023-02-20: 400 mg via ORAL
  Filled 2023-02-20: qty 2

## 2023-02-20 MED ORDER — RENA-VITE PO TABS: 1.0000 | ORAL_TABLET | Freq: Every day | ORAL | Status: DC

## 2023-02-20 MED ORDER — OXYCODONE-ACETAMINOPHEN 7.5-325 MG PO TABS
1.0000 | ORAL_TABLET | Freq: Three times a day (TID) | ORAL | Status: DC | PRN
  Filled 2023-02-20: qty 1

## 2023-02-20 MED ORDER — AMIODARONE HCL IN DEXTROSE 360-4.14 MG/200ML-% IV SOLN
30.0000 mg/h | INTRAVENOUS | Status: DC
  Administered 2023-02-21: 30 mg/h via INTRAVENOUS
  Filled 2023-02-20: qty 200

## 2023-02-20 MED ORDER — INSULIN ASPART 100 UNIT/ML IJ SOLN: 0.0000 [IU] | Freq: Three times a day (TID) | INTRAMUSCULAR | Status: DC

## 2023-02-20 MED ORDER — POTASSIUM CHLORIDE CRYS ER 20 MEQ PO TBCR
40.0000 meq | EXTENDED_RELEASE_TABLET | Freq: Once | ORAL | Status: AC
  Administered 2023-02-20: 40 meq via ORAL
  Filled 2023-02-20: qty 2

## 2023-02-20 MED ORDER — ASPIRIN 81 MG PO TBEC
81.0000 mg | DELAYED_RELEASE_TABLET | Freq: Every day | ORAL | Status: DC
  Administered 2023-02-20: 81 mg via ORAL
  Filled 2023-02-20: qty 1

## 2023-02-20 MED ORDER — ATORVASTATIN CALCIUM 40 MG PO TABS
40.0000 mg | ORAL_TABLET | Freq: Every evening | ORAL | Status: DC
  Administered 2023-02-20: 40 mg via ORAL
  Filled 2023-02-20: qty 1

## 2023-02-20 MED ORDER — INSULIN ASPART 100 UNIT/ML IJ SOLN: 0.0000 [IU] | Freq: Every day | INTRAMUSCULAR | Status: DC

## 2023-02-20 MED ORDER — WARFARIN SODIUM 2.5 MG PO TABS
2.5000 mg | ORAL_TABLET | Freq: Once | ORAL | Status: AC
  Administered 2023-02-20: 2.5 mg via ORAL
  Filled 2023-02-20: qty 1

## 2023-02-20 MED ORDER — CEFADROXIL 500 MG PO CAPS
1000.0000 mg | ORAL_CAPSULE | ORAL | Status: DC
  Administered 2023-02-20: 1000 mg via ORAL
  Filled 2023-02-20: qty 2

## 2023-02-20 MED ORDER — AMIODARONE HCL IN DEXTROSE 360-4.14 MG/200ML-% IV SOLN
INTRAVENOUS | Status: AC
  Filled 2023-02-20: qty 200

## 2023-02-20 NOTE — ED Notes (Signed)
LVAD Coordinator #  [phone]

## 2023-02-20 NOTE — Progress Notes (Signed)
This chaplain responded PMT PA-Josseline's consult for spiritual care in the context of grief resources requested by the Pt. daughter for the Pt. son.   The chaplain understands from the Pt. RN-Monica, the Pt. daughter-[name] has stepped away from the bedside. The chaplain will revisit on Tuesday with grief resources available through area hospices.  Chaplain Stephanie Acre [phone]

## 2023-02-20 NOTE — ED Triage Notes (Signed)
Pt bib ems from dialysis c/o defibrillator going off 4x. Pt wasn't able to get HD d/t staffing stating they will not pull the amount of fluid off that was ordered. While the pt was sitting he felt the pain of his defibrillator. Pt denies SOB, N/V, and dizziness.

## 2023-02-20 NOTE — Progress Notes (Signed)
LVAD Coordinator ED Encounter  [name] a 57 y.o. male that presented to [facility] ER today due to receiving multiple ICD shocks for VT storm. He has a past medical history  has a past medical history of Angina, ASCVD (arteriosclerotic cardiovascular disease), Automatic implantable cardiac defibrillator -[employer], Benign neoplasm of colon, CHF (congestive heart failure) (HCC), Chronic systolic heart failure (HCC), Coronary artery disease, Crohn's disease (HCC), Deep venous thrombosis (HCC), Dialysis patient (HCC), Dyspnea, Gastroparesis, GERD (gastroesophageal reflux disease), High cholesterol, Hyperlipidemia, Hypersomnolent, Hypertension, essential, Ischemic cardiomyopathy, Type II or unspecified type diabetes mellitus without mention of complication, not stated as uncontrolled, and Unspecified gastritis and gastroduodenitis without mention of hemorrhage.[name]   LVAD is a HM 3 and was implanted on 03/01/18 by Dr Donata Clay for destination therapy.   Received page this morning from HD center reporting pt told them he was shocked x 2 over the weekend. He sent a remote transmission, but did not notify VAD coordinator at time of shocks. VAD coordinator reached out to device clinic this morning. Per remote transmission pt was in VT storm all day Saturday with multiple unsuccessful ATP attempts. Finally shocked x 2 around 1245 AM Sunday morning, and converted to SR. No further transmissions received by device clinic. Unsure if he is currently in VT or not.  Received second page from HD center reporting pt was shocked x 4 by ICD during HD treatment. They called 911 and pt was transported to the ER.   Met pt in ER. Denies complaints other than receiving multiple shocks. Currently in SR/paced 86. Daughter [name] at bedside. He has not taken his Amio or Quinidine yet today, as he takes these when he gets home after HD treatments.   Brittainy PA at bedside. Discussed high likelihood of arrhythmia reoccurrence  and limited options. He is agreeable to speaking with palliative care again. He is interested in discussing ICD deactivation. [name] would like pt's son [name] to be present with her and pt to speak with palliative care again regarding options. Spoke briefly about VAD deactivation if he decides he would like full comfort measures. Reassured pt he is able to make decisions for himself, and that the team will continue to be honest with him regarding his prognosis. He verbalized understanding.   Vital signs: HR: 86 paced Doppler MAP:  Automated BP: 133/98 (106)  O2 Sat: 96%  LVAD interrogation reveals:  Speed: 5300 Flow: 3.7 Power: 4.0 w  PI: 5.4  Alarms: none Events: 60+ PI events  Drive Line: Maintained weekly by pt's daughter [name].   Significant Events with LVAD: Multiple recent hospitalizations for refractory VT/VT storm and multiple ICD shocks, secondary to worsening RV failure, back w/ recurrent medication refractory VT and repeated ICD Shocks.    Updated VAD Providers [facility]) about the above. No LVAD issues and pump is functioning as expected. Able to independently manage LVAD equipment. No LVAD needs at this time.   Plan:  Admit to 2C  Alyce Pagan RN VAD Coordinator  Office: [phone]  24/7 Pager: [phone]

## 2023-02-20 NOTE — ED Notes (Signed)
Pt left arm is limited to access d/t amiodarone infiltration last admission.  Pt states he is a hard stick

## 2023-02-20 NOTE — H&P (Addendum)
Advanced Heart Failure VAD History and Physical Note   PCP-Cardiologist: Dr. Shirlee Latch    Reason for Admission: Recurrent VT/ Multiple ICD Shock, in setting of end-stage RV failure   HPI:    57 y.o. with history of CAD s/p anterior MI in 2010 and NSTEMI in 3/18 with DES to pRCA and mLAD and ischemic cardiomyopathy s/p Heartmate 3 LVAD in 2018 and ESRD on HD. He also had hx of ischemic lower extremities s/p bilateral AKAs. 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 He is now on amiodarone + quinidine. EP has seen previous 2 admissions for VT and felt nothing further left to offer. Most recent echo 02/10/23 showed severe BiV failure, LVEF < 20%, RV moderately enlarged w/ severely educed systolic function.   He now returns to ED after repeated ICD shocks for VT. Had 2 shocks over the weekend and 4 additional shocks today during iHD.   He is A&Ox3. No respiratory difficulty. MAPs ok. Has not taken AM meds yet this morning. Labs pending. VAD interrogation w/ multiple PI events. No other alarms.   LVAD INTERROGATION:  HeartMate III LVAD:  Flow 3.5 liters/min, speed 5300, power 4.0, PI 5.7.  Multiple PI events. No low flow alarms.    Review of Systems: [y] = yes, [ ]  = no   General: Weight gain [ ] ; Weight loss [ ] ; Anorexia [ ] ; Fatigue [Y ]; Fever [ ] ; Chills [ ] ; Weakness [Y ]  Cardiac: Chest pain/pressure [ ] ; Resting SOB [ ] ; Exertional SOB [ ] ; Orthopnea [ ] ; Pedal Edema [ ] ; Palpitations [ ] ; Syncope [ ] ; Presyncope [ ] ; Paroxysmal nocturnal dyspnea[ ]   Pulmonary: Cough [ ] ; Wheezing[ ] ; Hemoptysis[ ] ; Sputum [ ] ; Snoring [ ]   GI: Vomiting[ ] ; Dysphagia[ ] ; Melena[ ] ; Hematochezia [ ] ; Heartburn[ ] ; Abdominal pain [ ] ; Constipation [ ] ; Diarrhea [ ] ; BRBPR [ ]   GU: Hematuria[ ] ; Dysuria [ ] ; Nocturia[ ]   Vascular: Pain in legs with walking [ ] ;  Pain in feet with lying flat [ ] ; Non-healing sores [ ] ; Stroke [ ] ; TIA [ ] ; Slurred speech [ ] ;  Neuro: Headaches[ ] ; Vertigo[ ] ; Seizures[ ] ; Paresthesias[ ] ;Blurred vision [ ] ; Diplopia [ ] ; Vision changes [ ]   Ortho/Skin: Arthritis [ ] ; Joint pain [ ] ; Muscle pain [ ] ; Joint swelling [ ] ; Back Pain [ ] ; Rash [ ]   Psych: Depression[ ] ; Anxiety[ ]   Heme: Bleeding problems [ ] ; Clotting disorders [ ] ; Anemia [ ]   Endocrine: Diabetes [ ] ; Thyroid dysfunction[ ]     Home Medications Prior to Admission medications   Medication Sig Start Date End Date Taking? Authorizing Provider  amiodarone (PACERONE) 200 MG tablet Take 2 tabets (400mg  total) twice daily for 7 days, then decrease dose to 1 tablet (200mg  total) twice daily 02/20/23   Bensimhon, Bevelyn Buckles, MD  aspirin EC 81 MG tablet Take 1 tablet (81 mg total) by mouth daily. 08/20/18   Janetta Hora, PA-C  atorvastatin (LIPITOR) 40 MG tablet Take 1 tablet (40 mg total) by mouth daily at 6 PM. Patient taking differently: Take 40 mg by mouth every evening. 11/20/20   Leeroy Bock, MD  calcitRIOL (ROCALTROL) 0.5 MCG capsule Take 1 capsule (0.5 mcg total) by mouth every Monday, Wednesday, and Friday. Patient taking differently: Take 0.5 mcg by mouth See admin instructions. Take one tablet by  mouth on Monday, Wednesdays and Fridays per daughter 06/06/18   Alford Highland, NP  cefadroxil (DURICEF) 500 MG capsule Take 2 capsules (1,000 mg total) by mouth 3 (three) times a week. Take AFTER dialysis sessions Patient taking differently: Take 1,000 mg by mouth 3 (three) times a week. Take AFTER dialysis sessions on Monday, Wednesday and Fridays only 06/13/22   Blanchard Kelch, NP  cinacalcet (SENSIPAR) 30 MG tablet Take 6 tablets (180 mg total) by mouth every Monday, Wednesday, and Friday with hemodialysis. 02/13/23   Alen Bleacher, NP  citalopram (CELEXA) 20 MG tablet Take 1 tablet (20 mg total) by mouth daily. 03/19/18   Alford Highland, NP  COLACE  100 MG capsule Take 100 mg by mouth daily as needed for moderate constipation. 07/30/19   [provider]  fluticasone (FLONASE) 50 MCG/ACT nasal spray Place 1 spray into both nostrils daily. 11/23/22   Omar Person, MD  insulin glargine (LANTUS) 100 UNIT/ML injection Inject 0.35 mLs (35 Units total) into the skin every morning. Patient taking differently: Inject 35 Units into the skin at bedtime. 08/25/22   Bess Kinds, MD  levocetirizine (XYZAL) 5 MG tablet Take 1 tablet (5 mg total) by mouth every evening. 11/23/22   Omar Person, MD  loratadine (CLARITIN) 10 MG tablet Take 1 tablet (10 mg total) by mouth daily. 02/14/23   Alen Bleacher, NP  LORazepam (ATIVAN) 0.5 MG tablet Take 1 tablet (0.5 mg total) by mouth every 12 (twelve) hours as needed (nausea). 02/26/20   [name] M, PA-C  metoCLOPramide (REGLAN) 5 MG tablet Take 1 tablet (5 mg total) by mouth 4 (four) times daily -  before meals and at bedtime. 02/13/23   Alen Bleacher, NP  midodrine (PROAMATINE) 5 MG tablet Take 2 tablets (10 mg total) by mouth every Monday, Wednesday, and Friday. Take 1 tablet before dialysis treatment 09/07/22   Laurey Morale, MD  multivitamin (RENA-VIT) TABS tablet Take 1 tablet by mouth daily.    [provider]  ondansetron (ZOFRAN-ODT) 4 MG disintegrating tablet Take 4 mg by mouth every 8 (eight) hours as needed for nausea or vomiting. 03/19/18   [provider]  oxyCODONE-acetaminophen (PERCOCET) 7.5-325 MG tablet Take 1 tablet by mouth 3 (three) times daily as needed for pain. 06/28/21   [provider]  pantoprazole (PROTONIX) 40 MG tablet Take 1 tablet (40 mg total) by mouth 2 (two) times daily before lunch and supper. 05/07/19   Barrett, Joline Salt, PA-C  promethazine (PHENERGAN) 12.5 MG tablet TAKE 1 TABLET (12.5 MG TOTAL) BY MOUTH EVERY 6 (SIX) HOURS AS NEEDED FOR NAUSEA OR VOMITING. 07/20/20   Laurey Morale, MD  quiNIDine sulfate 200 MG tablet Take 2  tablets (400 mg total) by mouth 2 (two) times daily. 02/13/23   Alen Bleacher, NP  sevelamer carbonate (RENVELA) 0.8 g PACK packet Take 0.8 g by mouth 3 (three) times daily with meals. 06/16/19   Bhagat, Sharrell Ku, PA  triamcinolone ointment (KENALOG) 0.1 % Apply 1 Application topically 2 (two) times daily. Patient taking differently: Apply 1 Application topically daily as needed (For rash). 08/25/22   Bess Kinds, MD  warfarin (COUMADIN) 5 MG tablet Take 0.5 tablets (2.5 mg total) by mouth daily. 02/14/23   Bensimhon, Bevelyn Buckles, MD    Past Medical History: Past Medical History:  Diagnosis Date   Angina    ASCVD (arteriosclerotic cardiovascular disease)    , Anterior infarction 2005, LAD diagonal  bifurcation intervention 03/2004   Automatic implantable cardiac defibrillator -St. Jude's        Benign neoplasm of colon    CHF (congestive heart failure) (HCC)    Chronic systolic heart failure (HCC)    Coronary artery disease     Widely patent previously placed stents in the left anterior    Crohn's disease (HCC)    Deep venous thrombosis (HCC)    Recurrent-on Coumadin   Dialysis patient (HCC)    Dyspnea    Gastroparesis    GERD (gastroesophageal reflux disease)    High cholesterol    Hyperlipidemia    Hypersomnolent    Previous diagnosis of narcolepsy   Hypertension, essential    Ischemic cardiomyopathy    Ejection fraction 15-20% catheterization 2010   Type II or unspecified type diabetes mellitus without mention of complication, not stated as uncontrolled    Unspecified gastritis and gastroduodenitis without mention of hemorrhage     Past Surgical History: Past Surgical History:  Procedure Laterality Date   A/V SHUNTOGRAM N/A 05/24/2018   Procedure: A/V SHUNTOGRAM;  Surgeon: Cephus Shelling, MD;  Location: [facility] INVASIVE CV LAB;  Service: Cardiovascular;  Laterality: N/A;   ABDOMINAL AORTOGRAM W/LOWER EXTREMITY N/A 03/18/2019   Procedure: ABDOMINAL AORTOGRAM W/LOWER  EXTREMITY;  Surgeon: Maeola Harman, MD;  Location: [facility] INVASIVE CV LAB;  Service: Cardiovascular;  Laterality: N/A;   AMPUTATION Right 03/21/2019   Procedure: AMPUTATION DIGIT RIGHT FOURTH TOE;  Surgeon: Maeola Harman, MD;  Location: [facility] OR;  Service: Vascular;  Laterality: Right;   AMPUTATION Left 05/29/2019   Procedure: AMPUTATION LEFT GREAT TOE;  Surgeon: Nada Libman, MD;  Location: [facility] OR;  Service: Vascular;  Laterality: Left;   AMPUTATION Right 06/02/2019   Procedure: ABOVE KNEE AMPUTATION;  Surgeon: Larina Earthly, MD;  Location: [facility] OR;  Service: Vascular;  Laterality: Right;   AMPUTATION Left 07/23/2019   Procedure: AMPUTATION ABOVE KNEE LEFT;  Surgeon: Sherren Kerns, MD;  Location: [facility] OR;  Service: Vascular;  Laterality: Left;   APPENDECTOMY     AV FISTULA PLACEMENT Right 06/26/2017   Procedure: ARTERIOVENOUS (AV) FISTULA CREATION VERSUS GRAFT RIGHT  ARM;  Surgeon: Fransisco Hertz, MD;  Location: [facility] OR;  Service: Vascular;  Laterality: Right;   AV FISTULA PLACEMENT Right 10/31/2017   Procedure: INSERTION OF ARTERIOVENOUS (AV) ARTEGRAFT,  RIGHT UPPER ARM;  Surgeon: Fransisco Hertz, MD;  Location: [facility] OR;  Service: Vascular;  Laterality: Right;   AV FISTULA PLACEMENT Left 05/29/2018   Procedure: ARTERIOVENOUS (AV) FISTULA CREATION  LEFT UPPER EXTREMITY;  Surgeon: Maeola Harman, MD;  Location: [facility] OR;  Service: Vascular;  Laterality: Left;   AV FISTULA PLACEMENT Left 08/09/2018   Procedure: INSERTION OF ARTERIOVENOUS (AV) GORE-TEX GRAFT LEFT UPPER ARM;  Surgeon: Maeola Harman, MD;  Location: [facility] OR;  Service: Vascular;  Laterality: Left;   CARDIAC DEFIBRILLATOR PLACEMENT  2010   St. Jude ICD   CENTRAL LINE INSERTION Right 05/24/2018   Procedure: CENTRAL LINE INSERTION;  Surgeon: Cephus Shelling, MD;  Location: [facility] INVASIVE CV LAB;  Service: Cardiovascular;  Laterality: Right;   COLECTOMY  ~ 2003   "for Crohn's" ascending colon.     CORONARY  STENT INTERVENTION N/A 11/25/2016   Procedure: Coronary Stent Intervention;  Surgeon: Marykay Lex, MD;  Location: [facility] INVASIVE CV LAB;  Service: Cardiovascular;  Laterality: N/A;   EP IMPLANTABLE DEVICE N/A 09/14/2016   Procedure: ICD Generator Changeout;  Surgeon: Duke Salvia,  MD;  Location: [facility] INVASIVE CV LAB;  Service: Cardiovascular;  Laterality: N/A;   ESOPHAGOGASTRODUODENOSCOPY N/A 07/12/2017   Procedure: ESOPHAGOGASTRODUODENOSCOPY (EGD);  Surgeon: Beverley Fiedler, MD;  Location: [facility] ENDOSCOPY;  Service: Gastroenterology;  Laterality: N/A;  VAD pt so VAD team will need to accompany pt.    FETAL SURGERY FOR CONGENITAL HERNIA     ????? pt has no knowledge of this.[name]     FISTULOGRAM Left 08/09/2018   Procedure: FISTULOGRAM LEFT ARM;  Surgeon: Maeola Harman, MD;  Location: [facility] OR;  Service: Vascular;  Laterality: Left;   INGUINAL HERNIA REPAIR     INSERTION OF DIALYSIS CATHETER Right 06/26/2017   Procedure: INSERTION OF TUNNELED  DIALYSIS CATHETER RIGHT INTERNAL JUGULAR;  Surgeon: Fransisco Hertz, MD;  Location: [facility] OR;  Service: Vascular;  Laterality: Right;   INSERTION OF IMPLANTABLE LEFT VENTRICULAR ASSIST DEVICE N/A 06/12/2017   Procedure: INSERTION OF IMPLANTABLE LEFT VENTRICULAR ASSIST DEVICE-HEARTMATE 3;  Surgeon: Kerin Perna, MD;  Location: [facility] OR;  Service: Open Heart Surgery;  Laterality: N/A;  HM3 LVAD  CIRC ARREST  NITRIC OXIDE   INSERTION OF IMPLANTABLE LEFT VENTRICULAR ASSIST DEVICE N/A 02/28/2018   Procedure: REDO INSERTION OF IMPLANTABLE LEFT VENTRICULAR ASSIST DEVICE - HEARTMATE 3;  Surgeon: Kerin Perna, MD;  Location: [facility] OR;  Service: Open Heart Surgery;  Laterality: N/A;   IR FLUORO GUIDE CV LINE RIGHT  07/22/2019   IR REMOVAL TUN CV CATH W/O FL  02/26/2018   IR THORACENTESIS ASP PLEURAL SPACE W/IMG GUIDE  06/28/2017   IR US GUIDE VASC ACCESS RIGHT  07/22/2019   LIGATION OF ARTERIOVENOUS  FISTULA Right 10/31/2017   Procedure: LIGATION OF BRACHIO-BASILIC VEIN  TRANSPOSITION RIGHT ARM;  Surgeon: Fransisco Hertz, MD;  Location: [facility] OR;  Service: Vascular;  Laterality: Right;   LOWER EXTREMITY ANGIOGRAPHY  05/02/2019   Procedure: Lower Extremity Angiography;  Surgeon: Cephus Shelling, MD;  Location: [facility] INVASIVE CV LAB;  Service: Cardiovascular;;   MULTIPLE EXTRACTIONS WITH ALVEOLOPLASTY N/A 06/08/2017   Procedure: MULTIPLE EXTRACTION WITH ALVEOLOPLASTY AND PRE PROSTHETIC SURGERY AS NEEDED;  Surgeon: Charlynne Pander, DDS;  Location: [facility] OR;  Service: Oral Surgery;  Laterality: N/A;   PERIPHERAL VASCULAR BALLOON ANGIOPLASTY  03/18/2019   Procedure: PERIPHERAL VASCULAR BALLOON ANGIOPLASTY;  Surgeon: Maeola Harman, MD;  Location: [facility] INVASIVE CV LAB;  Service: Cardiovascular;;  RT PT   PERIPHERAL VASCULAR INTERVENTION  03/18/2019   Procedure: PERIPHERAL VASCULAR INTERVENTION;  Surgeon: Maeola Harman, MD;  Location: [facility] INVASIVE CV LAB;  Service: Cardiovascular;;  RT SFA   PERIPHERAL VASCULAR INTERVENTION  05/02/2019   Procedure: PERIPHERAL VASCULAR INTERVENTION;  Surgeon: Cephus Shelling, MD;  Location: [facility] INVASIVE CV LAB;  Service: Cardiovascular;;  Lt. SFA   RIGHT HEART CATH N/A 06/07/2017   Procedure: RIGHT HEART CATH;  Surgeon: Laurey Morale, MD;  Location: [facility] INVASIVE CV LAB;  Service: Cardiovascular;  Laterality: N/A;   RIGHT HEART CATH N/A 02/08/2018   Procedure: RIGHT HEART CATH;  Surgeon: Laurey Morale, MD;  Location: [facility] INVASIVE CV LAB;  Service: Cardiovascular;  Laterality: N/A;   RIGHT HEART CATH N/A 08/09/2018   Procedure: RIGHT HEART CATH;  Surgeon: Laurey Morale, MD;  Location: [facility] INVASIVE CV LAB;  Service: Cardiovascular;  Laterality: N/A;   RIGHT/LEFT HEART CATH AND CORONARY ANGIOGRAPHY N/A 11/23/2016   Procedure: Right/Left Heart Cath and Coronary Angiography;  Surgeon: Laurey Morale, MD;  Location: [facility] INVASIVE CV LAB;  Service: Cardiovascular;  Laterality: N/A;   TEE WITHOUT CARDIOVERSION N/A 06/12/2017    Procedure: TRANSESOPHAGEAL ECHOCARDIOGRAM (TEE);  Surgeon: Donata Clay, Theron Arista, MD;  Location: [facility] OR;  Service: Open Heart Surgery;  Laterality: N/A;   TEE WITHOUT CARDIOVERSION N/A 02/28/2018   Procedure: TRANSESOPHAGEAL ECHOCARDIOGRAM (TEE);  Surgeon: Donata Clay, Theron Arista, MD;  Location: [facility] OR;  Service: Open Heart Surgery;  Laterality: N/A;   TEMPORARY DIALYSIS CATHETER Left 05/24/2018   Procedure: TEMPORARY DIALYSIS CATHETER;  Surgeon: Cephus Shelling, MD;  Location: [facility] INVASIVE CV LAB;  Service: Cardiovascular;  Laterality: Left;   THROMBECTOMY AND REVISION OF ARTERIOVENTOUS (AV) GORETEX  GRAFT Right 12/12/2017   Procedure: THROMBECTOMY ARTERIOVENTOUS (AV) GORETEX  GRAFT RIGHT UPPER ARM;  Surgeon: Fransisco Hertz, MD;  Location: [facility] OR;  Service: Vascular;  Laterality: Right;   TRANSMETATARSAL AMPUTATION Right 04/30/2019   Procedure: RIGHT TRANSMETATARSAL AMPUTATION;  Surgeon: Maeola Harman, MD;  Location: [facility] OR;  Service: Vascular;  Laterality: Right;    Family History: Family History  Problem Relation Age of Onset   Colon polyps Mother    Diabetes Mother    Heart attack Father    Diabetes Father    Stroke Paternal Grandfather    Colitis Sister    Heart disease Brother    Colitis Sister    Colon cancer Neg Hx     Social History: Social History   Socioeconomic History   Marital status: Married    Spouse name: Not on file   Number of children: Not on file   Years of education: Not on file   Highest education level: Not on file  Occupational History   Not on file  Tobacco Use   Smoking status: Never    Passive exposure: Past   Smokeless tobacco: Never  Vaping Use   Vaping Use: Never used  Substance and Sexual Activity   Alcohol use: No   Drug use: No   Sexual activity: Never  Other Topics Concern   Not on file  Social History Narrative   Not on file   Social Determinants of Health   Financial Resource Strain: Low Risk  (12/06/2022)   Overall Financial Resource  Strain (CARDIA)    Difficulty of Paying Living Expenses: Not hard at all  Food Insecurity: No Food Insecurity (12/06/2022)   Hunger Vital Sign    Worried About Running Out of Food in the Last Year: Never true    Ran Out of Food in the Last Year: Never true  Transportation Needs: No Transportation Needs (11/19/2019)   PRAPARE - Administrator, Civil Service (Medical): No    Lack of Transportation (Non-Medical): No  Physical Activity: Inactive (12/06/2022)   Exercise Vital Sign    Days of Exercise per Week: 0 days    Minutes of Exercise per Session: 0 min  Stress: No Stress Concern Present (12/06/2022)   [employer] of Occupational Health - Occupational Stress Questionnaire    Feeling of Stress : Not at all  Social Connections: Socially Integrated (12/06/2022)   Social Connection and Isolation Panel [NHANES]    Frequency of Communication with Friends and Family: More than three times a week    Frequency of Social Gatherings with Friends and Family: More than three times a week    Attends Religious Services: More than 4 times per year    Active Member of [employer] or Organizations: Yes    Attends Banker Meetings: More than 4 times per year    Marital Status:  Married    Allergies:  Allergies  Allergen Reactions   Metformin And Related Diarrhea    Objective:    Vital Signs:   Temp:  [98.1 F (36.7 C)] 98.1 F (36.7 C) (06/17 1054) Pulse Rate:  [86] 86 (06/17 1054) Resp:  [17] 17 (06/17 1054) BP: (133-143)/(98-122) 133/98 (06/17 1100) SpO2:  [100 %] 100 % (06/17 1054)    Mean arterial Pressure 90s  Physical Exam    General: chronically ill appearing. No resp difficulty HEENT: Normal Neck: supple. JVP not well visualized . Carotids 2+ bilat; no bruits. No lymphadenopathy or thyromegaly appreciated. Cor: Mechanical heart sounds with LVAD hum present. Lungs: decreased BS at the bases  Abdomen: soft, nontender, nondistended. No hepatosplenomegaly. No  bruits or masses. Good bowel sounds. Driveline: C/D/I; securement device intact and driveline incorporated Extremities: b/l BKAs, no cyanosis, clubbing, rash, edema Neuro: alert & orientedx3, cranial nerves grossly intact. moves all 4 extremities w/o difficulty. Affect pleasant   Telemetry   A-V dual paced 90s   EKG   12 lead pending   Labs    Basic Metabolic Panel: No results for input(s): "NA", "K", "CL", "CO2", "GLUCOSE", "BUN", "CREATININE", "CALCIUM", "MG", "PHOS" in the last 168 hours.  Liver Function Tests: No results for input(s): "AST", "ALT", "ALKPHOS", "BILITOT", "PROT", "ALBUMIN" in the last 168 hours. No results for input(s): "LIPASE", "AMYLASE" in the last 168 hours. No results for input(s): "AMMONIA" in the last 168 hours.  CBC: No results for input(s): "WBC", "NEUTROABS", "HGB", "HCT", "MCV", "PLT" in the last 168 hours.  Cardiac Enzymes: No results for input(s): "CKTOTAL", "CKMB", "CKMBINDEX", "TROPONINI" in the last 168 hours.  BNP: BNP (last 3 results) Recent Labs    02/03/23 0825  BNP 1,200.6*    ProBNP (last 3 results) No results for input(s): "PROBNP" in the last 8760 hours.   CBG: No results for input(s): "GLUCAP" in the last 168 hours.  Coagulation Studies: No results for input(s): "LABPROT", "INR" in the last 72 hours.  Other results: EKG: 12 lead ordered/ pending    Imaging    No results found.    Patient Profile:   57 y.o. with history of CAD s/p anterior MI in 2010 and NSTEMI in 3/18 with DES to pRCA and mLAD and ischemic cardiomyopathy s/p Heartmate 3 LVAD in 2018 and ESRD on HD. He also had hx of ischemic lower extremities s/p bilateral AKAs. 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   Assessment/Plan:    1.  Refractory VT w/ Multiple ICD Shocks  - 3rd admit in 4 wks w/ VT storm, in setting of worsening RV Failure - medication refractory, on amiodarone + quinidine. EP has seen previous 2 hospitalizations and felt noting left to offer - will check K and Mg - continue PO amiodarone 400 mg bid and quinidine 400 mg bid  - we discussed high likelihood of recurrence and limited options - discussed palliative care and ICD deactivation. He is agreeable to palliative care discussion but wants both of his children present for discussion. Daughter is here. He is contacting his son to come to hospital - keep ICD therapies on for now   2. Chronic Systolic, Biventricular Heart Failure, s/p HM IIILVAD - Ischemic CM. S/p HM3 2018  - He had pump thrombosis 6/19 due to [facility] outflow graft kinking.  He had pump exchange for another HM3  - has struggled w/ worsening RV Failure. Recent echo 6/25 LVEF < 20%, RV severely reduced. This is further c/b ESRD on HD.  Recent HD sessions/volume control have been limited by hypotension and VT  - per above, now end-stage RV Failure w/ recurrent VT - consult palliative care  - volume management through iHD - GDMT limited by ESRD and hypotension, on midodrine on HD days  - he would like to keep ICD on for now until palliative care discussion w/ family completed - we also introduced idea of VAD deactivation if he chooses to switch to comfort care - VAD parameters stable. Check INR and LDH. Coumadin per PharmD   3. ESRD - on iHD - will notify nephrology of admission    4. PAD - s/p bilateral BKAs  5. H/o Repeat DL Infections - on chronic cefadroxil for chronic suppression, continue   6. CAD - s/p anterior MI in 2010 and NSTEMI in 3/18 with DES to pRCA and mLAD - denies CP    I reviewed the LVAD parameters from today, and compared the results to the patient's prior recorded data.  No programming changes were made.  The LVAD is functioning within specified parameters.  The  patient performs LVAD self-test daily.  LVAD interrogation was negative for any significant power changes, alarms or PI events/speed drops.  LVAD equipment check completed and is in good working order.  Back-up equipment present.   LVAD education done on emergency procedures and precautions and reviewed exit site care.  Length of Stay: 0  [name], PA-C 02/20/2023, 11:35 AM  VAD Team Pager [phone] (7am - 7am) +++VAD ISSUES ONLY+++   Advanced Heart Failure Team Pager [phone] (M-F; 7a - 5p)  Please contact [facility] for night-coverage after hours (5p -7a ) and weekends on [email or web address] for all non- LVAD Issues

## 2023-02-20 NOTE — ED Provider Notes (Signed)
[place] EMERGENCY DEPARTMENT AT [facility] Provider Note   CSN: [id number] Arrival date & time: 2023-02-24  1040     History  Chief Complaint  Patient presents with   Pacemaker Problem    [name] is a 58 y.o. male.  HPI 57 YO AAM w/ ischemic cardiomyopathy s/p HMIII LVAD, ESRD on iHD and b/l AKA brought by EMS from dialysis.  Patient reports his defibrillator had gone off 4 times.  Patient was not able to complete dialysis.  He denies he feels short of breath at this time.  No nausea no vomiting.  Patient has had several prior admissions with refractory ventricular tachycardia and progressive right ventricular failure.    Home Medications Prior to Admission medications   Medication Sig Start Date End Date Taking? Authorizing Provider  amiodarone (PACERONE) 200 MG tablet Take 2 tabets (400mg  total) twice daily for 7 days, then decrease dose to 1 tablet (200mg  total) twice daily 02/24/23  Yes Bensimhon, Bevelyn Buckles, MD  aspirin EC 81 MG tablet Take 1 tablet (81 mg total) by mouth daily. 08/20/18  Yes Janetta Hora, PA-C  atorvastatin (LIPITOR) 40 MG tablet Take 1 tablet (40 mg total) by mouth daily at 6 PM. Patient taking differently: Take 40 mg by mouth every evening. 11/20/20  Yes Leeroy Bock, MD  calcitRIOL (ROCALTROL) 0.5 MCG capsule Take 1 capsule (0.5 mcg total) by mouth every Monday, Wednesday, and Friday. Patient taking differently: Take 0.5 mcg by mouth See admin instructions. Take one tablet by mouth on Monday, Wednesdays and Fridays per daughter 06/06/18  Yes Alford Highland, NP  cefadroxil (DURICEF) 500 MG capsule Take 2 capsules (1,000 mg total) by mouth 3 (three) times a week. Take AFTER dialysis sessions Patient taking differently: Take 1,000 mg by mouth 3 (three) times a week. Take AFTER dialysis sessions on Monday, Wednesday and Fridays only 06/13/22  Yes Blanchard Kelch, NP  cinacalcet (SENSIPAR) 30 MG tablet Take 6 tablets (180 mg  total) by mouth every Monday, Wednesday, and Friday with hemodialysis. 02/13/23  Yes Alen Bleacher, NP  citalopram (CELEXA) 20 MG tablet Take 1 tablet (20 mg total) by mouth daily. 03/19/18  Yes Alford Highland, NP  COLACE 100 MG capsule Take 100 mg by mouth daily as needed for moderate constipation. 07/30/19  Yes [provider]  fluticasone (FLONASE) 50 MCG/ACT nasal spray Place 1 spray into both nostrils daily. 11/23/22  Yes Omar Person, MD  insulin glargine (LANTUS) 100 UNIT/ML injection Inject 0.35 mLs (35 Units total) into the skin every morning. Patient taking differently: Inject 35 Units into the skin at bedtime. 08/25/22  Yes Sowell, Apolinar Junes, MD  loratadine (CLARITIN) 10 MG tablet Take 1 tablet (10 mg total) by mouth daily. 02/14/23  Yes Alen Bleacher, NP  LORazepam (ATIVAN) 0.5 MG tablet Take 1 tablet (0.5 mg total) by mouth every 12 (twelve) hours as needed (nausea). 02/26/20  Yes Sharol Harness, Brittainy M, PA-C  metoCLOPramide (REGLAN) 5 MG tablet Take 1 tablet (5 mg total) by mouth 4 (four) times daily -  before meals and at bedtime. 02/13/23  Yes Alen Bleacher, NP  midodrine (PROAMATINE) 5 MG tablet Take 2 tablets (10 mg total) by mouth every Monday, Wednesday, and Friday. Take 1 tablet before dialysis treatment 09/07/22  Yes Laurey Morale, MD  multivitamin (RENA-VIT) TABS tablet Take 1 tablet by mouth daily.   Yes [provider]  ondansetron (ZOFRAN-ODT) 4 MG disintegrating tablet Take  4 mg by mouth every 8 (eight) hours as needed for nausea or vomiting. 03/19/18  Yes [provider]  oxyCODONE-acetaminophen (PERCOCET) 7.5-325 MG tablet Take 1 tablet by mouth 3 (three) times daily as needed for pain. 06/28/21  Yes [provider]  pantoprazole (PROTONIX) 40 MG tablet Take 1 tablet (40 mg total) by mouth 2 (two) times daily before lunch and supper. 05/07/19  Yes Barrett, Joline Salt, PA-C  promethazine (PHENERGAN) 12.5 MG tablet TAKE 1 TABLET (12.5 MG TOTAL) BY  MOUTH EVERY 6 (SIX) HOURS AS NEEDED FOR NAUSEA OR VOMITING. 07/20/20  Yes Laurey Morale, MD  quiNIDine sulfate 200 MG tablet Take 2 tablets (400 mg total) by mouth 2 (two) times daily. 02/13/23  Yes Alen Bleacher, NP  sevelamer carbonate (RENVELA) 0.8 g PACK packet Take 0.8 g by mouth 3 (three) times daily with meals. 06/16/19  Yes Bhagat, Bhavinkumar, PA  triamcinolone ointment (KENALOG) 0.1 % Apply 1 Application topically 2 (two) times daily. Patient taking differently: Apply 1 Application topically daily as needed (For rash). 08/25/22  Yes Sowell, Apolinar Junes, MD  warfarin (COUMADIN) 5 MG tablet Take 0.5 tablets (2.5 mg total) by mouth daily. 02/14/23  Yes Bensimhon, Bevelyn Buckles, MD  levocetirizine (XYZAL) 5 MG tablet Take 1 tablet (5 mg total) by mouth every evening. Patient not taking: Reported on 03/11/2023 11/23/22   Omar Person, MD      Allergies    Metformin and related    Review of Systems   Review of Systems  Physical Exam Updated Vital Signs BP (!) 133/98 (BP Location: Left Arm)   Pulse 86   Temp 98.1 F (36.7 C) (Oral)   Resp 17   SpO2 100%  Physical Exam Constitutional:      Comments: Patient is alert.  No acute respiratory distress.  HENT:     Mouth/Throat:     Pharynx: Oropharynx is clear.  Eyes:     Extraocular Movements: Extraocular movements intact.  Cardiovascular:     Comments: LVAD Pulmonary:     Effort: Pulmonary effort is normal.  Abdominal:     Palpations: Abdomen is soft.  Skin:    General: Skin is warm and dry.  Neurological:     General: No focal deficit present.     Mental Status: He is oriented to person, place, and time.     ED Results / Procedures / Treatments   Labs (all labs ordered are listed, but only abnormal results are displayed) Labs Reviewed  BASIC METABOLIC PANEL  LACTIC ACID, PLASMA  LACTIC ACID, PLASMA  CBC WITH DIFFERENTIAL/PLATELET  MAGNESIUM  PHOSPHORUS  PROTIME-INR  LACTATE DEHYDROGENASE  TROPONIN I (HIGH  SENSITIVITY)    EKG EKG Interpretation  Date/Time:  Monday 2023/03/11 11:40:52 EDT Ventricular Rate:  85 PR Interval:  183 QRS Duration: 164 QT Interval:  420 QTC Calculation: 500 R Axis:   0 Text Interpretation: Atrial-ventricular dual-paced rhythm No further analysis attempted due to paced rhythm Artifact in lead(s) I II III aVR aVL V1 V2 V3 V4 V6 Confirmed by Vivien Rossetti ([id number]) on 02/21/2023 3:42:30 PM  Radiology No results found.  Procedures Procedures    Medications Ordered in ED Medications  amiodarone (PACERONE) tablet 400 mg (400 mg Oral Given 03-11-2023 1135)    Followed by  amiodarone (PACERONE) tablet 200 mg (has no administration in time range)  quiNIDine sulfate tablet 400 mg (has no administration in time range)  aspirin EC tablet 81 mg (has no administration in  time range)  atorvastatin (LIPITOR) tablet 40 mg (has no administration in time range)  LORazepam (ATIVAN) tablet 0.5 mg (has no administration in time range)  multivitamin (RENA-VIT) tablet 1 tablet (has no administration in time range)  oxyCODONE-acetaminophen (PERCOCET) 7.5-325 MG per tablet 1 tablet (has no administration in time range)  pantoprazole (PROTONIX) EC tablet 40 mg (has no administration in time range)    ED Course/ Medical Decision Making/ A&P                             Medical Decision Making Amount and/or Complexity of Data Reviewed Labs: ordered. Radiology: ordered.  Risk Decision regarding hospitalization.   Patient has severe comorbid illness.  He has brought from dialysis with report of multiple defibrillation from his ICD.  LVAD provider has already seen the patient.  I have ordered basic lab work.  At this juncture he has stable airway and is not showing signs of decompensating respiratory status.  1 and 17.  Potassium 3.1.  BUN 16 creatinine 4.37.  White count 8.5.  Hemoglobin 9..  Lactic 1.1  At this point, plan will be for admission to cardiology service with  management by LVAD team.        Final Clinical Impression(s) / ED Diagnoses Final diagnoses:  LVAD (left ventricular assist device) present (HCC)  Severe comorbid illness    Rx / DC Orders ED Discharge Orders     None         Arby Barrette, MD 02/09/2023 0030

## 2023-02-20 NOTE — Telephone Encounter (Signed)
Device alert for VT with ATP therapy There are 7 VT-1 events, 6/5 & 6/16 6/15 # 06:09, EGM shows sustained VT, rate 142, ATPx10, ATPx10, no resolution, duration 1hr end of strip show spontaneous conversion to regular AP/VP 6/15 @ 22:27, sustained VT, rate 142, ATP x1 VT continues, next event with ongoing VT, duration 1hr , ATPx10, ATPx10, no resolution 6/16 @ 00:37, sustained VT, rate 141, duration 4sec, no resolution, this event is followed by 3 NSVT 6/16 @ 00:40, sustained VT, rate 142, ATPx1 with no change Presenting rhythm ongoing VT Pt. currently in the ED 6/17 - route to triage for awareness LA, CVRS   Unscheduled manual transmission received.  Normal device function.  Presenting now AP/VP See previous document, 6/16 00:41 sustained VT, ATPx10, ATPx10, ATPx3, HV therapy 30J, HV 36J converts to regular AP/VP, 6/17 pt. in the ED LA, CVRS  I also spoke with LVAD Team this am after he reported feeling shocks this weekend.  Reviewed transmission and confirmed patient had VT storm on 6/15 with 2 shocks, 1 converting back to NSR with most recent presenting EGM AP/VP.  This report was emailed to the LVAD department attention: Alyce Pagan.   They were reviewing with plans to admit to hospital.      SHOCKS:

## 2023-02-20 NOTE — Progress Notes (Signed)
ANTICOAGULATION CONSULT NOTE - Initial Consult  Pharmacy Consult for warfarin Indication:  HM3 and Factor V leiden  Allergies  Allergen Reactions   Metformin And Related Diarrhea    Patient Measurements:    Vital Signs: Temp: 98.1 F (36.7 C) (06/17 1054) Temp Source: Oral (06/17 1054) BP: 133/98 (06/17 1100) Pulse Rate: 86 (06/17 1054)  Labs: No results for input(s): "HGB", "HCT", "PLT", "APTT", "LABPROT", "INR", "HEPARINUNFRC", "HEPRLOWMOCWT", "CREATININE", "CKTOTAL", "CKMB", "TROPONINIHS" in the last 72 hours.  Estimated Creatinine Clearance: 8.1 mL/min (A) (by C-G formula based on SCr of 9.62 mg/dL (H)).   Medical History: Past Medical History:  Diagnosis Date   Angina    ASCVD (arteriosclerotic cardiovascular disease)    , Anterior infarction 2005, LAD diagonal bifurcation intervention 03/2004   Automatic implantable cardiac defibrillator -[employer]        Benign neoplasm of colon    CHF (congestive heart failure) (HCC)    Chronic systolic heart failure (HCC)    Coronary artery disease     Widely patent previously placed stents in the left anterior    Crohn's disease (HCC)    Deep venous thrombosis (HCC)    Recurrent-on Coumadin   Dialysis patient (HCC)    Dyspnea    Gastroparesis    GERD (gastroesophageal reflux disease)    High cholesterol    Hyperlipidemia    Hypersomnolent    Previous diagnosis of narcolepsy   Hypertension, essential    Ischemic cardiomyopathy    Ejection fraction 15-20% catheterization 2010   Type II or unspecified type diabetes mellitus without mention of complication, not stated as uncontrolled    Unspecified gastritis and gastroduodenitis without mention of hemorrhage     Medications:  Scheduled:   amiodarone  400 mg Oral BID   Followed by   Melene Muller ON 02/21/2023] amiodarone  200 mg Oral BID   quiNIDine sulfate  400 mg Oral BID    Assessment: 57 yom presenting from HD after multiple ICD defibrillations. On warfarin PTA for  hx LVAD HM3 and Factor V leiden. PTA regimen is 2.5 mg daily (LD 6/16 PM).   Hgb 9, plt 95, LDH 222 (stable). INR came back elevated at 3.5 on Thursday - was instructed to hold 2 doses then start warfarin 2.5 mg. INR today came back lower at 1.5 after held doses. No s/sx of bleeding.   Goal of Therapy:  INR 2-3 Monitor platelets by anticoagulation protocol: Yes   Plan:  Order warfarin 2.5 mg tonight  Monitor daily INR, CBC, and for s/sx of bleeding   Thank you for allowing pharmacy to participate in this patient's care,  Sherron Monday, PharmD, BCCCP Clinical Pharmacist  Phone: [phone] 02/20/2023 11:35 AM  Please check AMION for all [facility] Pharmacy phone numbers After 10:00 PM, call Main Pharmacy [phone]

## 2023-02-20 NOTE — ED Notes (Signed)
LVAD RN at bedside

## 2023-02-20 NOTE — Consult Note (Signed)
Consultation Note Date: 02/20/2023   Patient Name: [name]  DOB: 1965-12-12  MRN: [id number]  Age / Sex: 57 y.o., male  PCP: Erick Alley, DO Referring Physician: Dorthula Nettles, DO  Reason for Consultation: Establishing goals of care  HPI/Patient Profile: 57 y.o. male  with past medical history of ischemic cardiomyopathy s/p HMIII LVAD, ESRD on iHD and b/l AKA admitted on 02/20/2023 with VT storm.   Patient was shocked a total of 6 times.  Over the past few weeks he has been admitted 3 times for refractory VT and progressive RV failure. Despite multiple attempts at rhythm control, management of volume status with inpatient iHD, etc his RV failure and arrhythmia burden continues to progress.  Patient expressed interest in going home with his ICD deactivated.  PMT has been consulted to assist with goals of care conversation.  Clinical Assessment and Goals of Care:  I have reviewed medical records including EPIC notes, labs and imaging, assessed the patient and then met at the bedside with patient's daughter [name] to discuss diagnosis prognosis, GOC, EOL wishes, disposition and options.  I introduced Palliative Medicine as specialized medical care for people living with serious illness. It focuses on providing relief from the symptoms and stress of a serious illness. The goal is to improve quality of life for both the patient and the family.  We discussed a brief life review of the patient and then focused on their current illness.  The natural disease trajectory and expectations at EOL were discussed.  I attempted to elicit values and goals of care important to the patient.    Medical History Review and Understanding:  Patient and his daughter have a good understanding of the severity of his illness.  We discussed his prognosis of weeks at best if HD continues to be limited by BP/VT issues and  they are unable to complete sessions.  Social History: Reviewed social history from previous PMT discussion on 02/09/2023 with my colleague.  Palliative Symptoms: Patient reports pain with shocks is 8 or 9 out of 10  Advance Directives: A detailed discussion regarding advanced directives was had.  Patient and his daughter confirm that she is primary HCPOA and his son 2 years secondary HCPOA.  Discussion: Patient and his daughter recount the conversation they had with the advanced heart failure team today.  They are now under the impression that "death is imminent" though they do not fully understand why.  We discussed that if patient's body, especially his heart, no longer has the ability to tolerate dialysis, his prognosis is indeed very poor and he is approaching end-of-life.  They accept this as difficult as it is.  Patient also shares that after a phone discussion with his mom today, he is reassured that all of his family will 100% support his children's decisions.  He wants to defer all decisions to his daughter and son. We explored his quality of life compared to the last time he spoke with our team.  He would rate his quality of life a 5 or 6 out of 10, which is the bare minimum he would be willing to live with.  He understands that continuing with aggressive medical interventions would likely only worsen his quality of life further.  He has plans to visit with his pastor tomorrow at noon and wants to hold off on any final decisions until then.  He wants to keep his faith at the forefront throughout this decision-making process, as this has always served him well  in the past.  Provided reassurance that this is always encouraged and that final decisions could wait until then.   I explored what he would feel if his son disagreed with recommendations for transition to comfort care, as family reports he is having a much harder time coming to terms with this.  He does not want to get involved or  stressed by this at all.  He is leaving this up to his son and daughter to figure out between them.  [name] accepted my offer to call her brother with explanation of the options and rationale.  She states that he was told months ago that his father did not have long to live and will need to have very blunt conversations with the medical team to potentially accept this reality at this time. [name] is also worried about their 51 year old brother [name], as is the patient.  Patient would want to make his son, [name], Aaron's legal guardian and wonders whether this could be completed before tomorrow's decision.  We discussed legal limitations given that Aaron's mother also has parental rights and the hospital would not be able to notarize any documents, though I can certainly look into this.  Will also assist with coordination of grief resources and counseling for teenagers at [name]'s request.   The difference between aggressive medical intervention and comfort care was considered in light of the patient's goals of care.  Discussed the importance of continued conversation with family and the medical providers regarding overall plan of care and treatment options, ensuring decisions are within the context of the patient's values and GOCs.   Questions and concerns were addressed.  Hard Choices booklet left for review. The family was encouraged to call with questions or concerns.  PMT will continue to support holistically.   SUMMARY OF RECOMMENDATIONS   -Continue partial code for now (no CPR) -Continue current care; patient wishes to wait until tomorrow afternoon for final decisions on transition to comfort care and deactivation of ICD -Spiritual care consult for grief resources and spiritual support -Psychosocial and emotional support provided -Patient is also interested in making his son legal guardian to his 24 year old son -PMT will continue to follow and support  Prognosis:   Weeks  Discharge Planning: To Be Determined      Primary Diagnoses: Present on Admission:  Ventricular tachycardia [facility])   Physical Exam Vitals and nursing note reviewed.  Constitutional:      General: He is not in acute distress.    Appearance: He is ill-appearing.  Cardiovascular:     Rate and Rhythm: Normal rate.  Pulmonary:     Effort: Pulmonary effort is normal.  Neurological:     Mental Status: He is alert. Mental status is at baseline.  Psychiatric:        Mood and Affect: Mood normal.        Behavior: Behavior normal.    Vital Signs: BP (!) 133/98 (BP Location: Left Arm)   Pulse 86   Temp 98.1 F (36.7 C) (Oral)   Resp 17   SpO2 100%  Pain Scale: 0-10   Pain Score: 0-No pain   SpO2: SpO2: 100 % O2 Device:SpO2: 100 % O2 Flow Rate: .     MDM: high   Ellen Goris Jeni Salles, PA-C  Palliative Medicine Team Team phone # [phone]  Thank you for allowing the Palliative Medicine Team to assist in the care of this patient. Please utilize secure chat with additional questions, if there is no response within 30  minutes please call the above phone number.  Palliative Medicine Team providers are available by phone from 7am to 7pm daily and can be reached through the team cell phone.  Should this patient require assistance outside of these hours, please call the patient's attending physician.

## 2023-02-20 NOTE — Progress Notes (Signed)
Patient went into VT. Upon assessment patient denies chest pain, SOB, and palpitations. Notified VAD coordinator and she notified Sabharwal, Aditya. Got orders from MD to start iv amio +bolus and lidocaine 100mg  bolus. MD at bedside having conversation with patient and family.

## 2023-02-21 DIAGNOSIS — R69 Illness, unspecified: Secondary | ICD-10-CM | POA: Diagnosis not present

## 2023-02-21 DIAGNOSIS — Z95811 Presence of heart assist device: Secondary | ICD-10-CM | POA: Diagnosis not present

## 2023-02-21 DIAGNOSIS — I472 Ventricular tachycardia, unspecified: Secondary | ICD-10-CM | POA: Diagnosis not present

## 2023-02-21 DIAGNOSIS — Z7189 Other specified counseling: Secondary | ICD-10-CM | POA: Diagnosis not present

## 2023-02-21 DIAGNOSIS — Z952 Presence of prosthetic heart valve: Secondary | ICD-10-CM | POA: Diagnosis not present

## 2023-02-21 DIAGNOSIS — I5084 End stage heart failure: Secondary | ICD-10-CM | POA: Diagnosis not present

## 2023-02-21 DIAGNOSIS — I5082 Biventricular heart failure: Secondary | ICD-10-CM | POA: Diagnosis not present

## 2023-02-21 LAB — CBC
HCT: 25.6 % — ABNORMAL LOW (ref 39.0–52.0)
Hemoglobin: 8.5 g/dL — ABNORMAL LOW (ref 13.0–17.0)
MCH: 27.1 pg (ref 26.0–34.0)
MCHC: 33.2 g/dL (ref 30.0–36.0)
MCV: 81.5 fL (ref 80.0–100.0)
Platelets: 100 10*3/uL — ABNORMAL LOW (ref 150–400)
RBC: 3.14 MIL/uL — ABNORMAL LOW (ref 4.22–5.81)
RDW: 16.9 % — ABNORMAL HIGH (ref 11.5–15.5)
WBC: 6.7 10*3/uL (ref 4.0–10.5)
nRBC: 0 % (ref 0.0–0.2)

## 2023-02-21 LAB — BASIC METABOLIC PANEL
Anion gap: 15 (ref 5–15)
BUN: 22 mg/dL — ABNORMAL HIGH (ref 6–20)
CO2: 27 mmol/L (ref 22–32)
Calcium: 8.2 mg/dL — ABNORMAL LOW (ref 8.9–10.3)
Chloride: 94 mmol/L — ABNORMAL LOW (ref 98–111)
Creatinine, Ser: 5.41 mg/dL — ABNORMAL HIGH (ref 0.61–1.24)
GFR, Estimated: 12 mL/min — ABNORMAL LOW (ref >60)
Glucose, Bld: 126 mg/dL — ABNORMAL HIGH (ref 70–99)
Potassium: 3.7 mmol/L (ref 3.5–5.1)
Sodium: 136 mmol/L (ref 135–145)

## 2023-02-21 LAB — GLUCOSE, CAPILLARY
Glucose-Capillary: 109 mg/dL — ABNORMAL HIGH (ref 70–99)
Glucose-Capillary: 107 mg/dL — ABNORMAL HIGH (ref 70–99)

## 2023-02-21 LAB — PROTIME-INR
INR: 1.6 — ABNORMAL HIGH (ref 0.8–1.2)
Prothrombin Time: 19.2 seconds — ABNORMAL HIGH (ref 11.4–15.2)

## 2023-02-21 LAB — LACTATE DEHYDROGENASE: LDH: 151 U/L (ref 98–192)

## 2023-02-21 MED ORDER — ONDANSETRON HCL 4 MG/2ML IJ SOLN
4.0000 mg | Freq: Four times a day (QID) | INTRAMUSCULAR | Status: DC | PRN
  Administered 2023-02-26: 4 mg via INTRAVENOUS
  Filled 2023-02-21: qty 2

## 2023-02-21 MED ORDER — POLYVINYL ALCOHOL 1.4 % OP SOLN: 1.0000 [drp] | Freq: Four times a day (QID) | OPHTHALMIC | Status: DC | PRN

## 2023-02-21 MED ORDER — CHLORHEXIDINE GLUCONATE CLOTH 2 % EX PADS
6.0000 | MEDICATED_PAD | Freq: Every day | CUTANEOUS | Status: DC
  Administered 2023-02-20 – 2023-02-24 (×5): 6 via TOPICAL

## 2023-02-21 MED ORDER — DIPHENHYDRAMINE HCL 50 MG/ML IJ SOLN: 12.5000 mg | INTRAMUSCULAR | Status: DC | PRN

## 2023-02-21 MED ORDER — BISACODYL 10 MG RE SUPP: 10.0000 mg | Freq: Every day | RECTAL | Status: DC | PRN

## 2023-02-21 MED ORDER — HYDROMORPHONE HCL 1 MG/ML IJ SOLN
0.5000 mg | INTRAMUSCULAR | Status: DC | PRN
  Administered 2023-02-25 (×2): 0.5 mg via INTRAVENOUS
  Filled 2023-02-21 (×2): qty 0.5

## 2023-02-21 MED ORDER — LORAZEPAM 2 MG/ML IJ SOLN
1.0000 mg | INTRAMUSCULAR | Status: DC | PRN
  Administered 2023-02-21 – 2023-02-25 (×12): 1 mg via INTRAVENOUS
  Filled 2023-02-21 (×12): qty 1

## 2023-02-21 MED ORDER — BIOTENE DRY MOUTH MT LIQD: 15.0000 mL | OROMUCOSAL | Status: DC | PRN

## 2023-02-21 MED ORDER — GLYCOPYRROLATE 0.2 MG/ML IJ SOLN: 0.2000 mg | INTRAMUSCULAR | Status: DC | PRN

## 2023-02-21 MED ORDER — SODIUM CHLORIDE 0.9 % IV SOLN
12.5000 mg | Freq: Once | INTRAVENOUS | Status: AC
  Administered 2023-02-21: 12.5 mg via INTRAVENOUS
  Filled 2023-02-21: qty 12.5

## 2023-02-21 MED ORDER — ONDANSETRON 4 MG PO TBDP: 4.0000 mg | ORAL_TABLET | Freq: Four times a day (QID) | ORAL | Status: DC | PRN

## 2023-02-21 NOTE — Progress Notes (Addendum)
Advanced Heart Failure VAD Team Note  PCP-Cardiologist: None   Subjective:    Readmitted with VT storm.   Complaining of nausea. He tells me he has been able fulfill his purpose and he is ok with everything that has happened.  LVAD INTERROGATION:  HeartMate III LVAD:   Flow 3.1 liters/min, speed 5300, power 4 PI 3.5.  ~ 20  PI  Objective:    Vital Signs:   Temp:  [97.7 F (36.5 C)-98.1 F (36.7 C)] 97.7 F (36.5 C) (06/18 0319) Pulse Rate:  [60-133] 73 (06/18 0319) Resp:  [14-20] 16 (06/18 0319) BP: (104-143)/(80-122) 107/82 (06/18 0319) SpO2:  [96 %-100 %] 96 % (06/18 0319) Weight:  [85.2 kg] 85.2 kg (06/18 0645)   Mean arterial Pressure 90s  Intake/Output:   Intake/Output Summary (Last 24 hours) at 02/21/2023 0710 Last data filed at 02/21/2023 0000 Gross per 24 hour  Intake 238.3 ml  Output --  Net 238.3 ml     Physical Exam    General:  . No resp difficulty HEENT: normal Neck: supple. JVP  9-10 . Carotids 2+ bilat; no bruits. No lymphadenopathy or thyromegaly appreciated.  Cor: Mechanical heart sounds with LVAD hum present.Tunneled HD catheter Lungs: Decreased RLL Abdomen: soft, nontender, nondistended. No hepatosplenomegaly. No bruits or masses. Good bowel sounds. Driveline: C/D/I; securement device intact and driveline incorporated Extremities: no cyanosis, clubbing, rash, edema. Bilateral AKA Neuro: alert & orientedx3, cranial nerves grossly intact. moves all 4 extremities w/o difficulty. Affect pleasant   Telemetry   VT 120s   EKG    N/A   Labs   Basic Metabolic Panel: Recent Labs  Lab 02/20/23 1118 02/21/23 0003  NA 137 136  K 3.1* 3.7  CL 95* 94*  CO2 26 27  GLUCOSE 109* 126*  BUN 16 22*  CREATININE 4.35* 5.41*  CALCIUM 7.7* 8.2*  MG 1.8  --   PHOS 2.1*  --     Liver Function Tests: No results for input(s): "AST", "ALT", "ALKPHOS", "BILITOT", "PROT", "ALBUMIN" in the last 168 hours. No results for input(s): "LIPASE", "AMYLASE"  in the last 168 hours. No results for input(s): "AMMONIA" in the last 168 hours.  CBC: Recent Labs  Lab 02/20/23 1118 02/21/23 0003  WBC 8.5 6.7  NEUTROABS 6.7  --   HGB 9.0* 8.5*  HCT 27.0* 25.6*  MCV 82.3 81.5  PLT 95* 100*    INR: Recent Labs  Lab 02/16/23 0000 02/20/23 1118 02/21/23 0003  INR 3.5* 1.5* 1.6*    Other results: EKG:    Imaging   DG Chest Port 1 View  Result Date: 02/20/2023 CLINICAL DATA:  Dialysis. EXAM: PORTABLE CHEST 1 VIEW COMPARISON:  Feb 03, 2023. FINDINGS: Stable cardiomediastinal silhouette. Left-sided defibrillator is unchanged. Left ventricular assist device is unchanged. Minimal right basilar subsegmental atelectasis or scarring is noted with minimal right pleural effusion. Bony thorax is unremarkable. IMPRESSION: Stable support apparatus as described above. Minimal right basilar subsegmental atelectasis or scarring with small right pleural effusion. Electronically Signed   By: Lupita Raider M.D.   On: 02/20/2023 12:58     Medications:     Scheduled Medications:  aspirin EC  81 mg Oral Daily   atorvastatin  40 mg Oral QPM   cefadroxil  1,000 mg Oral Q M,W,F   Chlorhexidine Gluconate Cloth  6 each Topical Q0600   insulin aspart  0-5 Units Subcutaneous QHS   insulin aspart  0-9 Units Subcutaneous TID WC   multivitamin  1 tablet  Oral QHS   pantoprazole  40 mg Oral BID AC   quiNIDine sulfate  400 mg Oral BID   Warfarin - Pharmacist Dosing Inpatient   Does not apply q1600    Infusions:  amiodarone 30 mg/hr (02/21/23 0607)    PRN Medications: acetaminophen, LORazepam, oxyCODONE-acetaminophen, promethazine   Patient Profile   57 YO AAM w/ ischemic cardiomyopathy s/p HMIII LVAD, ESRD on iHD and b/l AKA admitted with VT storm. [name] is well known to the heart failure service. Over the past several weeks he has been admitted several times with refractory VT and progressive RV failure. Despite multiple attempts at rhythm control,  management of volume status with inpatient iHD, etc his RV failure and arrhythmia burden continues to progress. Today he had 4 ICD shocks during dialysis with an additional two over the weekend. He was brought to [facility] for further evaluation.   Assessment/Plan:   1. Refractory VT w/ Multiple ICD Shocks  - 3rd admit in 4 wks w/ VT storm, in setting of worsening RV Failure - medication refractory, on amiodarone + quinidine. EP has seen previous 2 hospitalizations and felt noting left to offer -Remains in VT. Continue IV amio.  -continue  quinidine 400 mg bid  - ICD deactivated.  -   2. End Stage Biventricular Heart Failure, s/p HM IIILVAD - Ischemic CM. S/p HM3 2018  - He had pump thrombosis 6/19 due to [facility] outflow graft kinking.  He had pump exchange for another HM3  - has struggled w/ worsening RV Failure. Recent echo 6/25 LVEF < 20%, RV severely reduced. This is further c/b ESRD on HD.  Recent HD sessions/volume control have been limited by hypotension and VT  - per above, now end-stage RV Failure w/ recurrent VT - - GDMT limited by ESRD and hypotension, on midodrine on HD days  -  INR 1.6 .  -LDH stable.    3. ESRD - on iHD and Nephrology has been notified.  - Will wait for their input.    4. PAD - s/p bilateral BKAs   5. 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 CAD - s/p anterior MI in 2010 and NSTEMI in 3/18 with DES to pRCA and mLAD  7. GOC Palliative Care consulted. Appreciate input.  ICD is off.  He does not want to die at home. He is requesting discussion with Nephrology about HD and we discussed this will likely not be an option.  Discussed deactivating LVAD and use of comfort drips.  He is very appreciative of the time he has had with his family and expressed gratitude to the medical team.   I was asked to return to his room and he has decided he does not want any oral medications and does not want IV amiodarone. We  discussed hospital death and using comfort drips. He called his son and Mother. Stop labs and oral medications.   Discussed with Dr Gasper Lloyd.    I reviewed the LVAD parameters from today, and compared the results to the patient's prior recorded data.  No programming changes were made.  The LVAD is functioning within specified parameters.  The patient performs LVAD self-test daily.  LVAD interrogation was negative for any significant power changes, alarms or PI events/speed drops.  LVAD equipment check completed and is in good working order.  Back-up equipment present.   LVAD education done on emergency procedures and precautions and reviewed exit site care.  Length of Stay: 1  [name] Yoshimura, NP 02/21/2023, 7:10 AM  VAD Team --- VAD ISSUES ONLY--- Pager [phone] (7am - 7am)  Advanced Heart Failure Team  Pager [phone] (M-F; 7a - 5p)  Please contact [facility] for night-coverage after hours (5p -7a ) and weekends on [email or web address]

## 2023-02-21 NOTE — Progress Notes (Signed)
LVAD Coordinator Rounding Note:  Admitted 02/20/23 due to VT storm and multiple ICD shocks.   HM 3 LVAD implanted on 03/01/18 by Dr Donata Clay under destination therapy criteria.  Met with pt at bedside. His wife is currently visiting. He shares with me that he made the decision this morning to stop dialysis, and wishes for comfort medications only (pain and nausea management). He states he is not ready to deactivate VAD at this time, but wishes to remain in the hospital until he passes. He appears at peace with his decisions. He expressed appreciation for the heart failure/VAD team, and all of our support over the years.   ICD deactivated yesterday evening.   Palliative care consulted for further planning.   Vital signs: (comfort measures only starting 6/18)  Temp: 97.7 HR: 73  Doppler Pressure: 86 Automatic BP: 107/82 (91) O2 Sat: 96% on RA Wt: 187.8  lbs    LVAD interrogation reveals:  Speed: 5300 Flow: 3.0 Power: 3.9 w PI: 4.0  Alarms: none Events: 100 +  Hematocrit: 20- DO NOT TITRATE  Fixed speed: 5300 Low speed limit: 5000  Drive Line: Dressing clean, dry, and intact. Anchor correctly applied. Weekly dressing changes using daily kit. Next dressing change due 02/25/23 per bedside RN.   Labs: (comfort measures only starting 6/18) LDH trend: 151  INR trend: 1.6  Anticoagulation Plan: -INR Goal: 2.0 - 3.0 (Coumadin stopped 6/18) -ASA Dose: 81 mg daily (Stopped 6/18)  Device: -St Jude -Therapies: turned off 6/17 - Pacing: on             Arrythmias: hx of recurrent VT. Started on Quinidine 01/27/23.   Infection:    Adverse Events on VAD: -Recent admissions for VT. Started on Quinidine.   Plan/Recommendations:  1. Page VAD coordinator for any equipment or drive line issues 2. Weekly drive line dressing changes per bedside RN  Alyce Pagan RN VAD Coordinator  Office: [phone]  24/7 Pager: [phone]

## 2023-02-21 NOTE — Progress Notes (Addendum)
   02/21/23 0954  Spiritual Encounters  Type of Visit Initial  Care provided to: Patient  Referral source Nurse (RN/NT/LPN);APP (RN-Monica, PMT PA-Josseline)  Reason for visit End-of-life (Sharing of grief resources for 58yo son)  Spiritual Framework  Presenting Themes Meaning/purpose/sources of inspiration (Pt. is connected to family through ongoing communication. Pt. 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 Seperated and communicates with his wife. Two adult children and one 58yo son.)  Patient Stress Factors None identified  Family Stress Factors None identified  Goals  Self/Personal Goals Pt. desires continued communication with his family.  Interventions  Spiritual Care Interventions Made Established relationship of care and support;Compassionate presence;Reflective listening;Narrative/life review;Prayer;Provided grief education  Intervention Outcomes  Outcomes Connection to spiritual care;Connection to values and goals of care  Spiritual Care Plan  Spiritual Care Issues Still Outstanding Chaplain will continue to follow   This chaplain responded to RN-Monica's referral for spiritual care. The Pt. accepted the chaplain's invitation for ongoing spiritual care and education on how to page the chaplain through the RN.  This chaplain updated PMT on the Pt. decision to end HD and receive more information on residential hospice.  Chaplain Stephanie Acre [phone]

## 2023-02-21 NOTE — Progress Notes (Signed)
Daily Progress Note   Patient Name: [name]       Date: 02/21/2023 DOB: [birth year]  Age: 57 y.o. MRN#: [id number] Attending Physician: Dorthula Nettles, DO Primary Care Physician: Erick Alley, DO Admit Date: 02/20/2023  Reason for Consultation/Follow-up: Establishing goals of care  Subjective: Medical records reviewed including progress notes, labs, imaging. Patient assessed at the bedside. Discussed with LVAD coordinator.  Patient is wife was present visiting and visibly emotional.  Reviewed patient's overnight conversations and current wishes to stop dialysis and transition to comfort care.  He confirms that he is at peace with his prognosis after speaking with his pastor and the chaplain today.  He does not want to take any pills, preferring IV medications for comfort on an as-needed basis.  He also does not want to deactivate his LVAD.  He would like this to be addressed after he passes.  He does not want to be intubated, defibrillated or resuscitated.  He is also hoping to stay in the hospital for end-of-life.  I recommended consideration of residential hospice facility given it is just down the road from his home, however he is very adamant that he wants to stay in his current room.  He would also like for this PA to return in the afternoon to speak with his son and provide support during this difficult time.  Returned to the bedside in the afternoon and I met with patient's son Holiday representative.  He is stoic, denies any questions or concerns about patient's end-of-life process.  Assisted him with filling out an application for guardianship of his 43 year old brother.  Patient's sister will also come to sign as well and take the paperwork to the clerk of court.  Patient and his son wonder how  to get in touch with the home health agency that provided patient's hospital bed and wheelchair at home.  They would like to move patient's nephew into his old room as soon as they can.  Offered to reach out to [facility] for assistance and they are appreciative.  Questions and concerns addressed. PMT will continue to support holistically.   Length of Stay: 1   Physical Exam Vitals and nursing note reviewed.  Constitutional:      General: He is not in acute distress.    Appearance: He is ill-appearing.  Cardiovascular:  Rate and Rhythm: Normal rate.  Pulmonary:     Effort: Pulmonary effort is normal. No respiratory distress.  Neurological:     Mental Status: He is alert and oriented to person, place, and time.  Psychiatric:        Mood and Affect: Mood normal.        Behavior: Behavior normal.            Vital Signs: BP 107/82 (BP Location: Left Arm)   Pulse 73   Temp 97.7 F (36.5 C) (Oral)   Resp 16   Wt 85.2 kg   SpO2 96%   BMI 28.56 kg/m  SpO2: SpO2: 96 % O2 Device: O2 Device: Room Air O2 Flow Rate:         Palliative Care Assessment & Plan   Patient Profile: 57 y.o. male  with past medical history of ischemic cardiomyopathy s/p HMIII LVAD, ESRD on iHD and b/l AKA admitted on 02/20/2023 with VT storm.    Patient was shocked a total of 6 times.  Over the past few weeks he has been admitted 3 times for refractory VT and progressive RV failure. Despite multiple attempts at rhythm control, management of volume status with inpatient iHD, etc his RV failure and arrhythmia burden continues to progress.  Patient expressed interest in going home with his ICD deactivated.   PMT has been consulted to assist with goals of care conversation.  Assessment: Goals of care, patient VT storm, refractory to treatment Progressive right heart failure LVAD patient End-stage renal disease  Recommendations/Plan: CODE STATUS changed to DNR/DNI Transition to full comfort measures today.   Patient wishes to keep his LVAD functioning for the remainder of his life.  Defer management to LVAD team  Dilaudid PRN for pain/air hunger/comfort Robinul PRN for excessive secretions Ativan PRN for agitation/anxiety Zofran PRN for nausea Liquifilm tears PRN for dry eyes May have comfort feeding Comfort cart for family Unrestricted visitations in the setting of EOL (per policy) Oxygen PRN 2L or less for comfort. No escalation.   Assisted patient's adult children with application for guardianship of patient's minor son with consent of patient's mother Patient prefers dying in the hospital rather than a hospice facility.  He is certain he does not want to die at home [facility] consulted for assistance with coordination of return of DME to [facility] Psychosocial and emotional support provided PMT will continue to follow and support  Prognosis:  < 2 weeks  Discharge Planning: To Be Determined  Care plan was discussed with patient, patient's wife, patient's son, family friend, LVAD coordinator   Total time: I spent 65 minutes in the care of the patient today in the above activities and documenting the encounter.  MDM high         Araseli Sherry Jeni Salles, PA-C  Palliative Medicine Team Team phone # [phone]  Thank you for allowing the Palliative Medicine Team to assist in the care of this patient. Please utilize secure chat with additional questions, if there is no response within 30 minutes please call the above phone number.  Palliative Medicine Team providers are available by phone from 7am to 7pm daily and can be reached through the team cell phone.  Should this patient require assistance outside of these hours, please call the patient's attending physician.

## 2023-02-21 NOTE — Plan of Care (Signed)
  Problem: Coping: Goal: Level of anxiety will decrease Outcome: Progressing   Problem: Pain Managment: Goal: General experience of comfort will improve Outcome: Progressing   Problem: Safety: Goal: Ability to remain free from injury will improve Outcome: Progressing   

## 2023-02-21 NOTE — Progress Notes (Signed)
CSW met at bedside with patient. Patient shared the events of the past few days and stated that he realizes there is nothing more that can be done. He stated "it makes me sad for my family but it is not sad for me". He shared concerns about his youngest son [name] who is 58 yo and asked for grief counseling for him as he feels he is "not ready". CSW sat with patient as he contacted his older son and mother to share that he wanted to stop all his medications and move to comfort care. Patient was reflective on life and how grateful he is for the added years with his VAD. He spoke of his family and the source of strength provided by his mother. CSW provided listening and supportive intervention around end of life and grief resources for his family. Patient appears to be at peace with his decision to transition to comfort care. CSW will continue to follow and assist as needed with his family. Lasandra Beech, LCSW, CCSW-MCS [phone]

## 2023-02-21 NOTE — Progress Notes (Signed)
[facility] Progress Note   Assessment/ Plan:   1. VAD/ VT storm: ICD off, no more meds except for comfort. 2.  ESRD on HD MWF: no more HD 3.  BMM: no more binders 4.  Anemia: no ESA or iron 3.  Dispo: transitioning to comfort care will pall care involved.  Greatly appreciate it.    Subjective:    Asked to see for essentially EOL planning.  Briefly, pt is ESRD and VAD with severe RV failure now who has been our HD patient at [facility] for the past 6 years.  He had ICD shocks at HD Monday- now is at EOL.  He is in VT storm.  He has had a visit from his pastor and family.    He asks me if HD would extend life.  We had a thoughtful discussion.  We discussed that in the setting of VT storm with ICD deactivated- the risk of decompensation and cardiac arrest on HD is quite high.  We discussed that that would be quite unpleasant, cause him more pain and suffering, and would not be in line with his GOC.  He considered my opinion- and said that he does not want to do any more dialysis.    We discussed that life expectancy would be measured in weeks, likely less when he turns off his LVAD- defer to VAD team for this decision.    I expressed gratitude to Mr Fearrington for allowing [place] to participate in his care.  It has been a privilege.     Objective:   BP 107/82 (BP Location: Left Arm)   Pulse 73   Temp 97.7 F (36.5 C) (Oral)   Resp 16   Wt 85.2 kg   SpO2 96%   BMI 28.56 kg/m   Physical Exam: Gen:NAD CVS: regular on monitor Resp: unlabored Abd:  Ext s/p bilateral amputations ACCESS: [facility]  Labs: BMET Recent Labs  Lab 02/20/23 1118 02/21/23 0003  NA 137 136  K 3.1* 3.7  CL 95* 94*  CO2 26 27  GLUCOSE 109* 126*  BUN 16 22*  CREATININE 4.35* 5.41*  CALCIUM 7.7* 8.2*  PHOS 2.1*  --    CBC Recent Labs  Lab 02/20/23 1118 02/21/23 0003  WBC 8.5 6.7  NEUTROABS 6.7  --   HGB 9.0* 8.5*  HCT 27.0* 25.6*  MCV 82.3 81.5  PLT 95* 100*      Medications:      Chlorhexidine Gluconate Cloth  6 each Topical Q0600     Bufford Buttner MD 02/21/2023, 9:41 AM

## 2023-02-21 NOTE — Plan of Care (Signed)
  Problem: Education: Goal: Patient will understand all VAD equipment and how it functions Outcome: Progressing Goal: Patient will be able to verbalize current INR target range and antiplatelet therapy for discharge home Outcome: Progressing   Problem: Cardiac: Goal: LVAD will function as expected and patient will experience no clinical alarms Outcome: Progressing   Problem: Education: Goal: Knowledge of General Education information will improve Description: Including pain rating scale, medication(s)/side effects and non-pharmacologic comfort measures Outcome: Progressing   Problem: Health Behavior/Discharge Planning: Goal: Ability to manage health-related needs will improve Outcome: Progressing   Problem: Clinical Measurements: Goal: Ability to maintain clinical measurements within normal limits will improve Outcome: Progressing Goal: Will remain free from infection Outcome: Progressing Goal: Diagnostic test results will improve Outcome: Progressing Goal: Respiratory complications will improve Outcome: Progressing Goal: Cardiovascular complication will be avoided Outcome: Progressing   Problem: Clinical Measurements: Goal: Ability to maintain clinical measurements within normal limits will improve Outcome: Progressing Goal: Will remain free from infection Outcome: Progressing Goal: Diagnostic test results will improve Outcome: Progressing Goal: Respiratory complications will improve Outcome: Progressing Goal: Cardiovascular complication will be avoided Outcome: Progressing   Problem: Activity: Goal: Risk for activity intolerance will decrease Outcome: Progressing   Problem: Nutrition: Goal: Adequate nutrition will be maintained Outcome: Progressing   Problem: Coping: Goal: Level of anxiety will decrease Outcome: Progressing   Problem: Elimination: Goal: Will not experience complications related to bowel motility Outcome: Progressing Goal: Will not  experience complications related to urinary retention Outcome: Progressing   Problem: Pain Managment: Goal: General experience of comfort will improve Outcome: Progressing   Problem: Safety: Goal: Ability to remain free from injury will improve Outcome: Progressing   Problem: Skin Integrity: Goal: Risk for impaired skin integrity will decrease Outcome: Progressing

## 2023-02-22 DIAGNOSIS — Z952 Presence of prosthetic heart valve: Secondary | ICD-10-CM | POA: Diagnosis not present

## 2023-02-22 DIAGNOSIS — Z515 Encounter for palliative care: Secondary | ICD-10-CM | POA: Diagnosis not present

## 2023-02-22 DIAGNOSIS — I5084 End stage heart failure: Secondary | ICD-10-CM | POA: Diagnosis not present

## 2023-02-22 DIAGNOSIS — I472 Ventricular tachycardia, unspecified: Secondary | ICD-10-CM | POA: Diagnosis not present

## 2023-02-22 DIAGNOSIS — R69 Illness, unspecified: Secondary | ICD-10-CM | POA: Diagnosis not present

## 2023-02-22 DIAGNOSIS — Z95811 Presence of heart assist device: Secondary | ICD-10-CM | POA: Diagnosis not present

## 2023-02-22 DIAGNOSIS — I5082 Biventricular heart failure: Secondary | ICD-10-CM | POA: Diagnosis not present

## 2023-02-22 NOTE — TOC Progression Note (Signed)
Transition of Care [facility]) - Progression Note    Patient Details  Name: [name] MRN: [id number] Date of Birth: March 06, 1966  Transition of [facility]) CM/SW Contact  Elliot Cousin, RN Phone Number: [phone] 02/22/2023, 11:44 AM  Clinical Narrative:   HF TOC CM contacted son and provided contact number for [employer] for pick up of hospital bed and wheelchair. Informed son that he may own DME, to check with agency.    Expected Discharge Plan and Services     Social Determinants of Health (SDOH) Interventions SDOH Screenings   Food Insecurity: No Food Insecurity (12/06/2022)  Housing: Low Risk  (12/06/2022)  Transportation Needs: No Transportation Needs (11/19/2019)  Utilities: Not At Risk (12/06/2022)  Alcohol Screen: Low Risk  (12/06/2022)  Depression (PHQ2-9): Low Risk  (12/06/2022)  Financial Resource Strain: Low Risk  (12/06/2022)  Physical Activity: Inactive (12/06/2022)  Social Connections: Socially Integrated (12/06/2022)  Stress: No Stress Concern Present (12/06/2022)  Tobacco Use: Low Risk  (02/20/2023)    Readmission Risk Interventions    02/07/2023    9:06 AM  Readmission Risk Prevention Plan  Transportation Screening Complete

## 2023-02-22 NOTE — Plan of Care (Signed)
  Problem: Education: Goal: Patient will understand all VAD equipment and how it functions Outcome: Progressing Goal: Patient will be able to verbalize current INR target range and antiplatelet therapy for discharge home Outcome: Progressing   Problem: Cardiac: Goal: LVAD will function as expected and patient will experience no clinical alarms Outcome: Progressing   Problem: Education: Goal: Knowledge of General Education information will improve Description: Including pain rating scale, medication(s)/side effects and non-pharmacologic comfort measures Outcome: Progressing   Problem: Health Behavior/Discharge Planning: Goal: Ability to manage health-related needs will improve Outcome: Progressing   Problem: Clinical Measurements: Goal: Will remain free from infection Outcome: Progressing Goal: Respiratory complications will improve Outcome: Progressing   Problem: Nutrition: Goal: Adequate nutrition will be maintained Outcome: Progressing   Problem: Elimination: Goal: Will not experience complications related to bowel motility Outcome: Progressing Goal: Will not experience complications related to urinary retention Outcome: Progressing   Problem: Pain Managment: Goal: General experience of comfort will improve Outcome: Progressing   Problem: Safety: Goal: Ability to remain free from injury will improve Outcome: Progressing

## 2023-02-22 NOTE — Progress Notes (Addendum)
Advanced Heart Failure VAD Team Note  PCP-Cardiologist: None   Subjective:    Now comfort care. DNR/DNI. ICD deactivated. He is stopping HD. VAD remains active per pt request. PC team managing comfort meds.   In VT currently, 130s but tolerating w/ VAD. A&O. No respiratory difficulty. Sitting up in bed conversing w/ family. Appreciative of care.    LVAD INTERROGATION:  HeartMate III LVAD:   Flow 2.9 liters/min, speed 5300, power 3.8 PI 3.5.  numerous PI events   Objective:    Vital Signs:   Temp:  [97.7 F (36.5 C)-97.9 F (36.6 C)] 97.7 F (36.5 C) (06/19 0731) Pulse Rate:  [69-152] 69 (06/19 0731) Resp:  [10-20] 20 (06/19 0731) BP: (135-140)/(115-117) 140/115 (06/19 0731) SpO2:  [96 %-99 %] 96 % (06/19 0731)   Mean arterial Pressure 90s  Intake/Output:  No intake or output data in the 24 hours ending 02/22/23 1105    Physical Exam    General: chronically ill male, no respiratory distress  HEENT: normal Neck: supple. JVP  9-10 . Carotids 2+ bilat; no bruits. No lymphadenopathy or thyromegaly appreciated.  Cor: Mechanical heart sounds with LVAD hum present.Tunneled HD catheter Lungs: decreased BS at bases  Abdomen: soft, nontender, nondistended. No hepatosplenomegaly. No bruits or masses. Good bowel sounds. Driveline: C/D/I; securement device intact and driveline incorporated Extremities: no cyanosis, clubbing, rash, edema. Bilateral AKA Neuro: alert & orientedx3, cranial nerves grossly intact. moves all 4 extremities w/o difficulty. Affect pleasant   Telemetry   Slow VT 120-130s   EKG    N/A   Labs   Basic Metabolic Panel: Recent Labs  Lab 02/20/23 1118 02/21/23 0003  NA 137 136  K 3.1* 3.7  CL 95* 94*  CO2 26 27  GLUCOSE 109* 126*  BUN 16 22*  CREATININE 4.35* 5.41*  CALCIUM 7.7* 8.2*  MG 1.8  --   PHOS 2.1*  --     Liver Function Tests: No results for input(s): "AST", "ALT", "ALKPHOS", "BILITOT", "PROT", "ALBUMIN" in the last 168  hours. No results for input(s): "LIPASE", "AMYLASE" in the last 168 hours. No results for input(s): "AMMONIA" in the last 168 hours.  CBC: Recent Labs  Lab 02/20/23 1118 02/21/23 0003  WBC 8.5 6.7  NEUTROABS 6.7  --   HGB 9.0* 8.5*  HCT 27.0* 25.6*  MCV 82.3 81.5  PLT 95* 100*    INR: Recent Labs  Lab 02/16/23 0000 02/20/23 1118 02/21/23 0003  INR 3.5* 1.5* 1.6*    Other results: EKG:    Imaging   DG Chest Port 1 View  Result Date: 02/20/2023 CLINICAL DATA:  Dialysis. EXAM: PORTABLE CHEST 1 VIEW COMPARISON:  Feb 03, 2023. FINDINGS: Stable cardiomediastinal silhouette. Left-sided defibrillator is unchanged. Left ventricular assist device is unchanged. Minimal right basilar subsegmental atelectasis or scarring is noted with minimal right pleural effusion. Bony thorax is unremarkable. IMPRESSION: Stable support apparatus as described above. Minimal right basilar subsegmental atelectasis or scarring with small right pleural effusion. Electronically Signed   By: Lupita Raider M.D.   On: 02/20/2023 12:58     Medications:     Scheduled Medications:  Chlorhexidine Gluconate Cloth  6 each Topical Q0600    Infusions:    PRN Medications: acetaminophen, antiseptic oral rinse, bisacodyl, diphenhydrAMINE, glycopyrrolate, HYDROmorphone (DILAUDID) injection, LORazepam, ondansetron **OR** ondansetron (ZOFRAN) IV, oxyCODONE-acetaminophen, polyvinyl alcohol, promethazine   Patient Profile   57 YO AAM w/ ischemic cardiomyopathy s/p HMIII LVAD, ESRD on iHD and b/l AKA admitted with VT  storm. [name] is well known to the heart failure service. Over the past several weeks he has been admitted several times with refractory VT and progressive RV failure. Despite multiple attempts at rhythm control, management of volume status with inpatient iHD, etc his RV failure and arrhythmia burden continues to progress. Today he had 4 ICD shocks during dialysis with an additional two over the  weekend. He was brought to [facility] for further evaluation.   Assessment/Plan:   1. Refractory VT w/ Multiple ICD Shocks  - 3rd admit in 4 wks w/ VT storm, in setting of worsening RV Failure - medication refractory, on amiodarone + quinidine. EP has seen previous 2 hospitalizations and felt noting left to offer - now full comfort. In VT currently, tolerating ok w/ VAD still active - Anti arrhythmics stopped. ICD deactivated   2. End Stage Biventricular Heart Failure, s/p HM IIILVAD - Ischemic CM. S/p HM3 2018  - He had pump thrombosis 6/19 due to [facility] outflow graft kinking.  He had pump exchange for another HM3  - has struggled w/ worsening RV Failure. Recent echo 6/25 LVEF < 20%, RV severely reduced. This is further c/b ESRD on HD.  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 ESRD - he has stopped  HD    4. PAD - s/p bilateral BKAs   5. H/o Repeat DL Infections - abx stopped w/ transition to comfort care    6. CAD - s/p anterior MI in 2010 and NSTEMI in 3/18 with DES to pRCA and mLAD  7. GOC Palliative Care consulted. Appreciate input.  ICD is off. Has stopped iHD. Wants to keep VAD activated.  He does not want to die at home. Prefers dying in the hospital rather than a hospice facility. Palliative care managing comfort meds. Appreciate their assistance.      I reviewed the LVAD parameters from today, and compared the results to the patient's prior recorded data.  No programming changes were made.  The LVAD is functioning within specified parameters.  The patient performs LVAD self-test daily.  LVAD interrogation was negative for any significant power changes, alarms or PI events/speed drops.  LVAD equipment check completed and is in good working order.  Back-up equipment present.   LVAD education done on emergency procedures and precautions and reviewed exit site  care.  Length of Stay: 2  [name], PA-C 02/22/2023, 11:05 AM  VAD Team --- VAD ISSUES ONLY--- Pager [phone] (7am - 7am)  Advanced Heart Failure Team  Pager [phone] (M-F; 7a - 5p)  Please contact [facility] for night-coverage after hours (5p -7a ) and weekends on [email or web address]

## 2023-02-22 NOTE — Progress Notes (Signed)
CSW met at bedside with patient and ex-wife. Patient was in good spirits and shared some of his conversations with family and thoughts about his life. He was reflective and stated that he is sad for his family but not for himself. He shared that his children are cleaning out his room and will transition the room to his grandson. He stated that it makes him happy that they are able to start this process of grieving and he will miss not being here for his son's high school graduation. Patient appears to be very aware of his prognosis and accepting that his time is limited. CSW provided supportive listening and will continue to be available as needed. Lasandra Beech, LCSW, CCSW-MCS [phone]

## 2023-02-22 NOTE — Progress Notes (Addendum)
LVAD Coordinator Rounding Note:  Admitted 02/20/23 due to VT storm and multiple ICD shocks.   HM 3 LVAD implanted on 03/01/18 by Dr Donata Clay under destination therapy criteria.  Met with pt at bedside. His wife is currently visiting. He shares that he is at peace, and has enjoyed visiting with family at bedside. He asked thoughtful questions regarding what to expect in the coming days. All questions answered at this time. Denies complaints.   He states he is not ready to deactivate VAD at this time, but wishes to remain in the hospital until he passes. ICD deactivated. Palliative care following.  Vital signs: (comfort measures only starting 6/18)  Temp: 97.7 HR: 126 VT  Doppler Pressure:  Automatic BP: 140/115 (125) O2 Sat: 96% on 3 L Wt: 187.8  lbs    LVAD interrogation reveals:  Speed: 5300 Flow: 2.9 Power: 3.8 w PI: 3.5  Alarms: none Events: 100 +  Hematocrit: 20- DO NOT TITRATE  Fixed speed: 5300 Low speed limit: 5000  Drive Line: Dressing clean, dry, and intact. Anchor correctly applied. Weekly dressing changes using daily kit. Next dressing change due 02/25/23 per bedside RN.   Labs: (comfort measures only starting 6/18) LDH trend: 151  INR trend: 1.6  Anticoagulation Plan: -INR Goal: 2.0 - 3.0 (Coumadin stopped 6/18) -ASA Dose: 81 mg daily (Stopped 6/18)  Device: -St Jude -Therapies: turned off 6/17 - Pacing: on             Arrythmias: hx of recurrent VT. Started on Quinidine 01/27/23.   Infection:    Adverse Events on VAD: -Recent admissions for VT. Started on Quinidine.   Plan/Recommendations:  1. Page VAD coordinator for any equipment or drive line issues 2. Weekly drive line dressing changes per bedside RN  Alyce Pagan RN VAD Coordinator  Office: [phone]  24/7 Pager: [phone]

## 2023-02-22 NOTE — Progress Notes (Signed)
Daily Progress Note   Patient Name: [name]       Date: 02/22/2023 DOB: 26-Sep-1965  Age: 57 y.o. MRN#: [id number] Attending Physician: Dorthula Nettles, DO Primary Care Physician: Erick Alley, DO Admit Date: 02/20/2023  Reason for Consultation/Follow-up: Establishing goals of care  Subjective: Medical records reviewed including progress notes, labs, imaging. Patient assessed at the bedside. He is sitting up in bed and denies pain or distress. He has family members visiting and RN at the bedside as well.  [name] is currently satisfied with his comfort medications, stating that ativan does a great job at helping him get rest. He does not feel any adjustments are required at this time. He shares that his social worker is double checking whether any additional paperwork is needed for the guardianship application we filled out together yesterday. He hopes to have this sorted out before he dies to not burden his wife. She provided him with reassurance that she will do whatever is needed to make the transition smooth. No further needs at this time.   Questions and concerns addressed. PMT will continue to support holistically.   Length of Stay: 2   Physical Exam Vitals and nursing note reviewed.  Constitutional:      General: He is not in acute distress.    Appearance: He is ill-appearing.  Cardiovascular:     Rate and Rhythm: Normal rate.  Pulmonary:     Effort: Pulmonary effort is normal. No respiratory distress.  Neurological:     Mental Status: He is alert and oriented to person, place, and time.  Psychiatric:        Mood and Affect: Mood normal.        Behavior: Behavior normal.            Vital Signs: BP (!) 140/115 (BP Location: Right Arm)   Pulse 69   Temp 97.7 F  (36.5 C) (Oral)   Resp 20   Wt 85.2 kg   SpO2 96%   BMI 28.56 kg/m  SpO2: SpO2: 96 % O2 Device: O2 Device: Room Air O2 Flow Rate:         Palliative Care Assessment & Plan   Patient Profile: 57 y.o. male  with past medical history of ischemic cardiomyopathy s/p HMIII LVAD, ESRD on iHD and b/l AKA admitted on  02/20/2023 with VT storm.    Patient was shocked a total of 6 times.  Over the past few weeks he has been admitted 3 times for refractory VT and progressive RV failure. Despite multiple attempts at rhythm control, management of volume status with inpatient iHD, etc his RV failure and arrhythmia burden continues to progress.  Patient expressed interest in going home with his ICD deactivated.   PMT has been consulted to assist with goals of care conversation.  Assessment: Goals of care, patient VT storm, refractory to treatment Progressive right heart failure LVAD patient End-stage renal disease  Recommendations/Plan: Continue DNR/DNI Continue comfort care, no adjustments required today Patient wishes to keep his LVAD functioning for the remainder of his life.  Defer management to LVAD team  Patient prefers dying in the hospital rather than a hospice facility.  He is certain he does not want to die at home Psychosocial and emotional support provided PMT will continue to follow and support  Prognosis:  < 2 weeks  Discharge Planning: Anticipated Hospital Death  Care plan was discussed with patient, patient's wife, patient's SIL, family friend, HF LCSW   Total time: I spent 35 minutes in the care of the patient today in the above activities and documenting the encounter.         Tillmon Kisling Jeni Salles, PA-C  Palliative Medicine Team Team phone # [phone]  Thank you for allowing the Palliative Medicine Team to assist in the care of this patient. Please utilize secure chat with additional questions, if there is no response within 30 minutes please call the above  phone number.  Palliative Medicine Team providers are available by phone from 7am to 7pm daily and can be reached through the team cell phone.  Should this patient require assistance outside of these hours, please call the patient's attending physician.

## 2023-02-23 DIAGNOSIS — Z952 Presence of prosthetic heart valve: Secondary | ICD-10-CM | POA: Diagnosis not present

## 2023-02-23 DIAGNOSIS — I472 Ventricular tachycardia, unspecified: Secondary | ICD-10-CM | POA: Diagnosis not present

## 2023-02-23 DIAGNOSIS — I5082 Biventricular heart failure: Secondary | ICD-10-CM | POA: Diagnosis not present

## 2023-02-23 DIAGNOSIS — I5084 End stage heart failure: Secondary | ICD-10-CM | POA: Diagnosis not present

## 2023-02-23 DIAGNOSIS — Z95811 Presence of heart assist device: Secondary | ICD-10-CM | POA: Diagnosis not present

## 2023-02-23 DIAGNOSIS — R69 Illness, unspecified: Secondary | ICD-10-CM | POA: Diagnosis not present

## 2023-02-23 DIAGNOSIS — Z515 Encounter for palliative care: Secondary | ICD-10-CM | POA: Diagnosis not present

## 2023-02-23 NOTE — Progress Notes (Signed)
Advanced Heart Failure VAD Team Note  PCP-Cardiologist: None   Subjective:    Now comfort care. DNR/DNI. ICD deactivated. HD stopped. VAD remains active per pt request. PC team managing comfort meds.   In slow VT, 80s-90s. Resting comfortably in bed in no acute distress.   LVAD INTERROGATION:  HeartMate III LVAD:   Flow 2.8 liters/min, speed 5300, power 3.9 PI 2.3.  7 PI events   Objective:    Vital Signs:   Temp:  [97.6 F (36.4 C)] 97.6 F (36.4 C) (06/19 1954) Pulse Rate:  [127] 127 (06/19 1954) Resp:  [20] 20 (06/19 1954) BP: (99)/(78) 99/78 (06/19 1954) SpO2:  [96 %] 96 % (06/19 1954)   Mean arterial Pressure N/A. comfort  Intake/Output:   Intake/Output Summary (Last 24 hours) at 02/23/2023 0731 Last data filed at 02/22/2023 1213 Gross per 24 hour  Intake 120 ml  Output --  Net 120 ml   Physical Exam  General:  chronically ill appearing, sleepy. No resp difficulty HEENT: Normal Neck: supple. JVP ~10. Carotids 2+ bilat; no bruits. No lymphadenopathy or thyromegaly appreciated. Cor: Mechanical heart sounds with LVAD hum present. Lungs: Clear Abdomen: soft, nontender, nondistended. No hepatosplenomegaly. No bruits or masses. Good bowel sounds. Driveline: C/D/I; securement device intact and driveline incorporated Extremities: no cyanosis, clubbing, rash, edema. Bilateral AKA Neuro: alert & orientedx3, cranial nerves grossly intact. moves extremities w/o difficulty.   Telemetry   Slow VT 90s (Personally reviewed)    EKG    N/A   Labs   Basic Metabolic Panel: Recent Labs  Lab 02/20/23 1118 02/21/23 0003  NA 137 136  K 3.1* 3.7  CL 95* 94*  CO2 26 27  GLUCOSE 109* 126*  BUN 16 22*  CREATININE 4.35* 5.41*  CALCIUM 7.7* 8.2*  MG 1.8  --   PHOS 2.1*  --     Liver Function Tests: No results for input(s): "AST", "ALT", "ALKPHOS", "BILITOT", "PROT", "ALBUMIN" in the last 168 hours. No results for input(s): "LIPASE", "AMYLASE" in the last 168  hours. No results for input(s): "AMMONIA" in the last 168 hours.  CBC: Recent Labs  Lab 02/20/23 1118 02/21/23 0003  WBC 8.5 6.7  NEUTROABS 6.7  --   HGB 9.0* 8.5*  HCT 27.0* 25.6*  MCV 82.3 81.5  PLT 95* 100*    INR: Recent Labs  Lab 02/20/23 1118 02/21/23 0003  INR 1.5* 1.6*   Other results: EKG:   Imaging   No results found.  Medications:     Scheduled Medications:  Chlorhexidine Gluconate Cloth  6 each Topical Q0600    Infusions:    PRN Medications: acetaminophen, antiseptic oral rinse, bisacodyl, diphenhydrAMINE, glycopyrrolate, HYDROmorphone (DILAUDID) injection, LORazepam, ondansetron **OR** ondansetron (ZOFRAN) IV, oxyCODONE-acetaminophen, polyvinyl alcohol, promethazine Patient Profile   57 YO AAM w/ ischemic cardiomyopathy s/p HMIII LVAD, ESRD on iHD and b/l AKA admitted with VT storm. [name] is well known to the heart failure service. Over the past several weeks he has been admitted several times with refractory VT and progressive RV failure. Despite multiple attempts at rhythm control, management of volume status with inpatient iHD, etc his RV failure and arrhythmia burden continues to progress. Today he had 4 ICD shocks during dialysis with an additional two over the weekend. He was brought to [facility] for further evaluation.   Assessment/Plan:   1. Refractory VT w/ Multiple ICD Shocks  - 3rd admit in 4 wks w/ VT storm, in setting of worsening RV Failure - medication  refractory, on amiodarone + quinidine. EP has seen previous 2 hospitalizations and felt noting left to offer - now full comfort. In VT currently, tolerating ok w/ VAD still active - Anti arrhythmics stopped. ICD deactivated   2. End Stage Biventricular Heart Failure, s/p HM IIILVAD - Ischemic CM. S/p HM3 2018  - He had pump thrombosis 6/19 due to [facility] outflow graft kinking.  He had pump exchange for another HM3  - has struggled w/ worsening RV Failure. Recent echo 6/25 LVEF <  20%, RV severely reduced. This is further c/b ESRD on HD.  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 ESRD - he has stopped HD    4. PAD - s/p bilateral AKAs   5. H/o Repeat DL Infections - abx stopped w/ transition to comfort care    6. CAD - s/p anterior MI in 2010 and NSTEMI in 3/18 with DES to pRCA and mLAD  7. GOC Palliative Care following. Appreciate input.  ICD is off. Has stopped iHD. Wants to keep VAD activated.  He does not want to die at home. Prefers dying in the hospital rather than a hospice facility. Palliative care managing comfort meds. Appreciate their assistance.    I reviewed the LVAD parameters from today, and compared the results to the patient's prior recorded data.  No programming changes were made.  The LVAD is functioning within specified parameters.  The patient performs LVAD self-test daily.  LVAD interrogation was negative for any significant power changes, alarms or PI events/speed drops.  LVAD equipment check completed and is in good working order.  Back-up equipment present.   LVAD education done on emergency procedures and precautions and reviewed exit site care.  Length of Stay: 3  Alen Bleacher, NP 02/23/2023, 7:31 AM  VAD Team --- VAD ISSUES ONLY--- Pager [phone] (7am - 7am)  Advanced Heart Failure Team  Pager [phone] (M-F; 7a - 5p)  Please contact [facility] for night-coverage after hours (5p -7a ) and weekends on [email or web address]

## 2023-02-23 NOTE — Progress Notes (Signed)
Daily Progress Note   Patient Name: [name]       Date: 02/23/2023 DOB: [birth year]  Age: 57 y.o. MRN#: [id number] Attending Physician: Dorthula Nettles, DO Primary Care Physician: Erick Alley, DO Admit Date: 02/20/2023  Reason for Consultation/Follow-up: Establishing goals of care  Subjective: Medical records reviewed including progress notes, labs, imaging. Patient assessed at the bedside. He is comfortable, visiting with his wife. He has just had his breakfast.   Patient required 3 doses of PRN Ativan in the past 24 hours. Patient continues to be satisfied with his current symptom management plan. Updated on my conversation with Annice Pih regarding guardianship process for his son.   Questions and concerns addressed. PMT will continue to support holistically.   Length of Stay: 3   Physical Exam Vitals and nursing note reviewed.  Constitutional:      General: He is not in acute distress.    Appearance: He is ill-appearing.  Cardiovascular:     Rate and Rhythm: Normal rate.  Pulmonary:     Effort: Pulmonary effort is normal. No respiratory distress.  Neurological:     Mental Status: He is alert and oriented to person, place, and time.  Psychiatric:        Mood and Affect: Mood normal.        Behavior: Behavior normal.            Vital Signs: BP 99/78 (BP Location: Left Arm)   Pulse (!) 127   Temp 97.6 F (36.4 C) (Oral)   Resp 20   Wt 85.2 kg   SpO2 96%   BMI 28.56 kg/m  SpO2: SpO2: 96 % O2 Device: O2 Device: Room Air O2 Flow Rate:         Palliative Care Assessment & Plan   Patient Profile: 57 y.o. male  with past medical history of ischemic cardiomyopathy s/p HMIII LVAD, ESRD on iHD and b/l AKA admitted on 02/20/2023 with VT storm.    Patient was  shocked a total of 6 times.  Over the past few weeks he has been admitted 3 times for refractory VT and progressive RV failure. Despite multiple attempts at rhythm control, management of volume status with inpatient iHD, etc his RV failure and arrhythmia burden continues to progress.  Patient expressed interest in going home with his ICD  deactivated.   PMT has been consulted to assist with goals of care conversation.  Assessment: Goals of care, patient VT storm, refractory to treatment Progressive right heart failure LVAD patient End-stage renal disease  Recommendations/Plan: Continue DNR/DNI Continue comfort care, no adjustments required today Patient wishes to keep his LVAD functioning for the remainder of his life.  Defer management to LVAD team  Patient prefers dying in the hospital rather than a hospice facility Psychosocial and emotional support provided PMT will continue to follow and support  Prognosis:  < 2 weeks  Discharge Planning: Anticipated Hospital Death  Care plan was discussed with patient, patient's wife, RN   Total time: I spent 35 minutes in the care of the patient today in the above activities and documenting the encounter.         Elex Mainwaring Jeni Salles, PA-C  Palliative Medicine Team Team phone # [phone]  Thank you for allowing the Palliative Medicine Team to assist in the care of this patient. Please utilize secure chat with additional questions, if there is no response within 30 minutes please call the above phone number.  Palliative Medicine Team providers are available by phone from 7am to 7pm daily and can be reached through the team cell phone.  Should this patient require assistance outside of these hours, please call the patient's attending physician.

## 2023-02-23 NOTE — Care Management Important Message (Signed)
Important Message  Patient Details  Name: [name] MRN: [id number] Date of Birth: Feb 05, 1966   Medicare Important Message Given:  Yes     Saahil Herbster 02/23/2023, 12:50 PM

## 2023-02-23 NOTE — Progress Notes (Signed)
LVAD Coordinator Rounding Note:  Admitted 02/20/23 due to VT storm and multiple ICD shocks.   HM 3 LVAD implanted on 03/01/18 by Dr Donata Clay under destination therapy criteria.  Met with pt at bedside. His wife is currently visiting. Awake and alert this morning. States his family is bringing him prime rib for lunch. Endorses he is comfortable at this time. All questions answered at this time. Denies complaints.   He states he is not ready to deactivate VAD at this time, but wishes to remain in the hospital until he passes. ICD deactivated. Palliative care following.  Vital signs: (comfort measures only starting 6/18)   HR: 125 VT  SpO2: 95 RA  LVAD interrogation reveals:  Speed: 5300 Flow: 2.7 Power: 3.8 w PI: 4.0  Alarms: none Events: 100 +  Hematocrit: 20- DO NOT TITRATE  Fixed speed: 5300 Low speed limit: 5000  Drive Line: Dressing clean, dry, and intact. Anchor correctly applied. Weekly dressing changes using daily kit. Next dressing change due 02/25/23 per bedside RN.   Labs: (comfort measures only starting 6/18) LDH trend: 151  INR trend: 1.6  Anticoagulation Plan: -INR Goal: 2.0 - 3.0 (Coumadin stopped 6/18) -ASA Dose: 81 mg daily (Stopped 6/18)  Device: -St Jude -Therapies: turned off 6/17 - Pacing: on             Arrythmias: hx of recurrent VT. Started on Quinidine 01/27/23.   Infection:   Adverse Events on VAD: -Recent admissions for VT. Started on Quinidine.   Plan/Recommendations:  1. Page VAD coordinator for any equipment or drive line issues 2. Weekly drive line dressing changes per bedside RN  Simmie Davies RN,BSN VAD Coordinator  Office: [phone]  24/7 Pager: [phone]

## 2023-02-24 DIAGNOSIS — R69 Illness, unspecified: Secondary | ICD-10-CM | POA: Diagnosis not present

## 2023-02-24 DIAGNOSIS — Z952 Presence of prosthetic heart valve: Secondary | ICD-10-CM | POA: Diagnosis not present

## 2023-02-24 DIAGNOSIS — Z95811 Presence of heart assist device: Secondary | ICD-10-CM | POA: Diagnosis not present

## 2023-02-24 DIAGNOSIS — I5082 Biventricular heart failure: Secondary | ICD-10-CM | POA: Diagnosis not present

## 2023-02-24 DIAGNOSIS — I472 Ventricular tachycardia, unspecified: Secondary | ICD-10-CM | POA: Diagnosis not present

## 2023-02-24 DIAGNOSIS — I5084 End stage heart failure: Secondary | ICD-10-CM | POA: Diagnosis not present

## 2023-02-24 DIAGNOSIS — Z515 Encounter for palliative care: Secondary | ICD-10-CM | POA: Diagnosis not present

## 2023-02-24 MED ORDER — WHITE PETROLATUM EX OINT
TOPICAL_OINTMENT | CUTANEOUS | Status: DC | PRN
  Administered 2023-02-24: 0.2 via TOPICAL
  Filled 2023-02-24: qty 28.35

## 2023-02-24 NOTE — Progress Notes (Signed)
Daily Progress Note   Patient Name: [name]       Date: 02/24/2023 DOB: 1965/10/23  Age: 57 y.o. MRN#: [id number] Attending Physician: Dorthula Nettles, DO Primary Care Physician: Erick Alley, DO Admit Date: 02/09/2023  Reason for Consultation/Follow-up: Non pain symptom management, Pain control, Psychosocial/spiritual support, and Terminal Care  Subjective: I have reviewed medical records including EPIC notes, MAR, and labs. PRN comfort medications utilized in the last 24 hours: ativan x3. Received report from primary RN - no acute concerns.  Went to visit patient at bedside - wife/Brenda present. Patient was lying in bed asleep - he easily wakes to voice/gentle touch - he is able to participate in conversation. No signs or non-verbal gestures of pain or discomfort noted. No respiratory distress, increased work of breathing, or secretions noted. Patient reports "feeling ok" he is waiting for RN to bring ativan.   Therapeutic listening and emotional support provided to patient and his wife as they reflect on their relationship together and their 3 children.   Patient questions when Palliative Care will transition to hospice care. Education provided on PMT role while he remains in house for EOL care. Patient confirms he does not want transfer to hospice facility or to return home with hospice.  All questions and concerns addressed. Encouraged to call with questions and/or concerns. PMT card provided.  Length of Stay: 4  Current Medications: Scheduled Meds:   Chlorhexidine Gluconate Cloth  6 each Topical Q0600    Continuous Infusions:   PRN Meds: acetaminophen, antiseptic oral rinse, bisacodyl, diphenhydrAMINE, glycopyrrolate, HYDROmorphone (DILAUDID) injection, LORazepam,  ondansetron **OR** ondansetron (ZOFRAN) IV, oxyCODONE-acetaminophen, polyvinyl alcohol, promethazine  Physical Exam Vitals and nursing note reviewed.  Constitutional:      General: He is not in acute distress.    Appearance: He is ill-appearing.  Pulmonary:     Effort: No respiratory distress.  Skin:    General: Skin is warm and dry.  Neurological:     Mental Status: He is alert and oriented to person, place, and time.     Motor: Weakness present.  Psychiatric:        Attention and Perception: Attention normal.        Behavior: Behavior is cooperative.        Cognition and Memory: Cognition and memory normal.  Vital Signs: BP 103/81 (BP Location: Left Arm)   Pulse (!) 31   Temp 97.7 F (36.5 C) (Oral)   Resp 15   Wt 85.2 kg   SpO2 97%   BMI 28.56 kg/m  SpO2: SpO2: 97 % O2 Device: O2 Device: Room Air O2 Flow Rate:    Intake/output summary:  Intake/Output Summary (Last 24 hours) at 02/24/2023 1025 Last data filed at 02/23/2023 2052 Gross per 24 hour  Intake 120 ml  Output --  Net 120 ml   LBM:   Baseline Weight: Weight: 85.2 kg Most recent weight: Weight: 85.2 kg       Palliative Assessment/Data: PPS 30%      Patient Active Problem List   Diagnosis Date Noted   Ventricular tachycardia (HCC) 02/08/2023   AMS (altered mental status) 02/03/2023   Hemodialysis-associated hypotension 02/03/2023   VT (ventricular tachycardia) (HCC) 01/27/2023   Unspecified atrial flutter (HCC) 12/01/2022   Skin lesion of cheek 08/18/2022   Wheelchair dependence 08/06/2022   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HCC) 12/29/2021   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/24/2020   Penile ulcer 09/19/2019   PAD (peripheral artery disease) (HCC) 07/23/2019   Warfarin anticoagulation 04/15/2019   Heart failure (HCC) 05/23/2018   Benign essential HTN    Crohn's disease with complication (HCC)    Diabetes mellitus type 2  in nonobese (HCC)    Anemia of chronic disease    Sinus tachycardia    Factor V Leiden carrier (HCC) 01/10/2018   History of creation of ostomy (HCC) 01/10/2018   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) 07/10/2017   Chronic systolic CHF (congestive heart failure) (HCC) 06/07/2017   DNR (do not resuscitate) discussion    CHF exacerbation (HCC) 04/05/2017   Diabetic keto-acidosis (HCC) 04/05/2017   ESRD on dialysis (HCC) 01/17/2017   Non-ST elevation (NSTEMI) myocardial infarction [facility])    CHF (congestive heart failure) (HCC) 09/02/2016   SVT (supraventricular tachycardia) 04/26/2012   Gastroparesis 11/18/2011   Coronary artery disease    Ischemic cardiomyopathy    Essential hypertension    Automatic implantable cardioverter-defibrillator in situ    POLYP, COLON 09/25/2008   DIVERTICULOSIS, COLON 09/25/2008   History of Crohn's disease 09/05/2006    Palliative Care Assessment & Plan   Patient Profile: 57 y.o. male  with past medical history of ischemic cardiomyopathy s/p HMIII LVAD, ESRD on iHD and b/l AKA admitted on 02/13/2023 with VT storm.    Patient was shocked a total of 6 times.  Over the past few weeks he has been admitted 3 times for refractory VT and progressive RV failure. Despite multiple attempts at rhythm control, management of volume status with inpatient iHD, etc his RV failure and arrhythmia burden continues to progress.  Patient expressed interest in going home with his ICD deactivated.   PMT has been consulted to assist with goals of care conversation.  Assessment: Principal Problem:   Ventricular tachycardia [facility])   Terminal care  Recommendations/Plan: Continue full comfort measures Continue DNR/DNI as previously documented Patient confirms he wishes to remain in house for EOL. He does not want transfer to hospice facility or to return home with hospice Patient wishes to keep his LVAD functioning for the remainder of his life. Defer  management to LVAD team  Continue current comfort focused medication regimen - no changes PMT will continue to follow and support holistically  Symptom Management Dilaudid PRN pain/dyspnea/increased work of breathing/RR>25 Tylenol  PRN pain/fever Biotin PRN dry mouth Benadryl PRN itching Robinul PRN secretions Ativan PRN anxiety/seizure/sleep/distress Zofran PRN nausea/vomiting Percocet PRN pain Phenergan RN nausea/vomiting Liquifilm Tears PRN dry eye   Goals of Care and Additional Recommendations: Limitations on Scope of Treatment: Full Comfort Care  Code Status:    Code Status Orders  (From admission, onward)           Start     Ordered   02/21/23 1150  Do not attempt resuscitation (DNR)  Continuous       Question Answer Comment  If patient has no pulse and is not breathing Do Not Attempt Resuscitation   If patient has a pulse and/or is breathing: Medical Treatment Goals COMFORT MEASURES: Keep clean/warm/dry, use medication by any route; positioning, wound care and other measures to relieve pain/suffering; use oxygen, suction/manual treatment of airway obstruction for comfort; do not transfer unless for comfort needs.   Consent: Discussion documented in EHR or advanced directives reviewed      02/21/23 1155           Code Status History     Date Active Date Inactive Code Status Order ID Comments User Context   02/14/2023 1153 02/21/2023 1155 Partial Code [id number] Ventricular Assist Device in place.  No Chest Compressions. Allayne Butcher, [place] ED   02/03/2023 1107 02/13/2023 2051 Partial Code [id number] Ventricular Assist Device in place.  No Chest Compressions. Allayne Butcher, [place] ED   01/27/2023 1125 01/30/2023 1903 Partial Code [id number] Ventricular Assist Device in place.  No Chest Compressions. Sherald Hess, NP ED   07/22/2019 1423 07/30/2019 1540 Partial Code [id number] Ventricular Assist Device in place.  No Chest Compressions. Allayne Butcher, [place]  Inpatient   05/28/2019 1055 06/16/2019 2008 Partial Code [id number] Ventricular Assist Device in place.  No Chest Compressions. Sherald Hess, NP Inpatient   04/26/2019 1445 05/13/2019 1811 Partial Code [id number] Ventricular Assist Device in place.  No Chest Compressions. Sherald Hess, NP Inpatient   03/27/2019 1839 04/04/2019 1240 Partial Code [id number] Ventricular Assist Device in place.  No Chest Compressions. Sherald Hess, NP Inpatient   03/15/2019 1706 03/23/2019 0110 Partial Code [id number] Ventricular Assist Device in place.  No Chest Compressions. Laurey Morale, MD Inpatient   08/28/2018 2142 09/03/2018 1723 Partial Code [id number] Ventricular Assist Device in place.  No Chest Compressions. Dolores Patty, MD Inpatient   08/22/2018 1531 08/27/2018 1948 Partial Code [id number] Ventricular Assist Device in place.  No Chest Compressions. Luane [employer] Inpatient   08/08/2018 1237 08/18/2018 1530 Partial Code [id number] Ventricular Assist Device in place.  No Chest Compressions. Alford Highland, NP Inpatient   06/11/2018 1518 06/13/2018 2322 Partial Code [id number] Ventricular Assist Device in place.  No Chest Compressions. Alford Highland, NP ED   05/23/2018 1617 06/05/2018 1709 Partial Code [id number] Ventricular Assist Device in place.  No Chest Compressions. Sherald Hess, NP Inpatient   03/23/2018 1542 03/25/2018 1607 Partial Code [id number] Ventricular Assist Device in place.  No Chest Compressions. Luane [employer] Inpatient   03/01/2018 2993 03/19/2018 1727 Partial Code [id number] Ventricular Assist Device in place. Kerin Perna, MD Inpatient   03/01/2018 0018 03/01/2018 0924 Partial Code [id number] Ventricular Assist Device in place.  No Chest Compressions. Dolores Patty, MD Inpatient   02/08/2018 1207 02/08/2018 1832 Full Code [id number]  Laurey Morale, MD Inpatient   01/19/2018 1504 01/22/2018 2231 Partial Code [id number] Ventricular Assist Device in place.  No Chest  Compressions. Sherald Hess, NP ED   12/20/2017 2041 12/22/2017 1942 Partial Code [id number] Ventricular Assist Device in place.  No Chest Compressions. Sherald Hess, NP Inpatient   12/11/2017 1202 12/18/2017 2034 Partial Code [id number] Ventricular Assist Device in place.  No Chest Compressions. Luane [employer] Inpatient   10/29/2017 1046 11/04/2017 1627 Partial Code [id number] Ventricular Assist Device in place.  No Chest Compressions. Sherald Hess, NP Inpatient   09/12/2017 1316 09/16/2017 2033 Partial Code [id number] Ventricular Assist Device in place.  No Chest Compressions. Sherald Hess, NP Inpatient   07/31/2017 0352 08/04/2017 1739 Partial Code [id number] Ventricular Assist Device in place.  No Chest Compressions. Dolores Patty, MD ED   07/10/2017 1327 07/16/2017 1505 Partial Code [id number] Ventricular Assist Device in place.  No Chest Compressions. [employer]ane [employer] Inpatient   06/12/2017 1331 07/03/2017 1704 Partial Code [id number] Ventricular Assist Device in place. Kerin Perna, MD Inpatient   06/07/2017 1131 06/12/2017 1331 Full Code [id number]  Laurey Morale, MD Inpatient   04/05/2017 2129 04/13/2017 2003 Full Code [id number]  Beaulah Dinning, MD ED   02/08/2017 1426 02/13/2017 1509 Full Code [id number]  Little Ishikawa, NP Inpatient   12/05/2016 1748 12/06/2016 1735 Full Code [id number]  Manson Passey, PA Inpatient   11/18/2016 1612 11/28/2016 1726 Full Code [id number]  Jake Bathe, MD Inpatient   09/14/2016 1249 09/14/2016 1649 Full Code [id number]  Duke Salvia, MD Inpatient   08/20/2016 1628 08/22/2016 2113 Full Code [id number]  Berton Bon, MD Inpatient   11/17/2011 1651 11/18/2011 2051 Full Code [id number]  Barbera Setters, RN Inpatient       Prognosis:  < 2 weeks  Discharge Planning: Anticipated Hospital Death  Care plan was discussed with primary RN, patient, patient's wife  Thank you for allowing the Palliative Medicine Team to assist in the care of  this patient.   Haskel Khan, NP  Please contact Palliative Medicine Team phone at [phone] for questions and concerns.   *Portions of this note are a verbal dictation therefore any spelling and/or grammatical errors are due to the "Dragon [employer] One" system interpretation.

## 2023-02-24 NOTE — Progress Notes (Signed)
Advanced Heart Failure VAD Team Note  PCP-Cardiologist: None   Subjective:    Denies SOB. Not ready to deactivate LVAD.   LVAD INTERROGATION:  HeartMate III LVAD:   Flow 3 liters/min, speed 5300, power 4 PI 2.5  Objective:    Vital Signs:   Temp:  [97.7 F (36.5 C)] 97.7 F (36.5 C) (06/20 1954) Pulse Rate:  [31-96] 31 (06/21 0809) Resp:  [12-16] 15 (06/21 0809) BP: (103)/(81) 103/81 (06/20 1954) SpO2:  [94 %-97 %] 97 % (06/21 0809)   Mean arterial Pressure N/A. comfort  Intake/Output:   Intake/Output Summary (Last 24 hours) at 02/24/2023 1610 Last data filed at 02/23/2023 2052 Gross per 24 hour  Intake 120 ml  Output --  Net 120 ml   Physical Exam  Physical Exam: GENERAL: No acute distress. HEENT: normal  NECK: Supple, JVP 6-7  .  2+ bilaterally, no bruits.  No lymphadenopathy or thyromegaly appreciated.   CARDIAC:  Mechanical heart sounds with LVAD hum present.  LUNGS:  Clear to auscultation bilaterally.  ABDOMEN:  Soft, round, nontender, positive bowel sounds x4.     LVAD exit site: well-healed and incorporated.  Dressing dry and intact.  No erythema or drainage.  Stabilization device present and accurately applied.  Driveline dressing is being changed daily per sterile technique. EXTREMITIES:  Warm and dry, no cyanosis, clubbing, rash or edema  NEUROLOGIC:  Alert and oriented x 3.    No aphasia.  No dysarthria.  Affect pleasant.     Telemetry   Slow VT 90s (Personally reviewed)    EKG    N/A   Labs   Basic Metabolic Panel: Recent Labs  Lab 16-Mar-2023 1118 02/21/23 0003  NA 137 136  K 3.1* 3.7  CL 95* 94*  CO2 26 27  GLUCOSE 109* 126*  BUN 16 22*  CREATININE 4.35* 5.41*  CALCIUM 7.7* 8.2*  MG 1.8  --   PHOS 2.1*  --     Liver Function Tests: No results for input(s): "AST", "ALT", "ALKPHOS", "BILITOT", "PROT", "ALBUMIN" in the last 168 hours. No results for input(s): "LIPASE", "AMYLASE" in the last 168 hours. No results for input(s):  "AMMONIA" in the last 168 hours.  CBC: Recent Labs  Lab 16-Mar-2023 1118 02/21/23 0003  WBC 8.5 6.7  NEUTROABS 6.7  --   HGB 9.0* 8.5*  HCT 27.0* 25.6*  MCV 82.3 81.5  PLT 95* 100*    INR: Recent Labs  Lab 2023/03/16 1118 02/21/23 0003  INR 1.5* 1.6*   Other results: EKG:   Imaging   No results found.  Medications:     Scheduled Medications:  Chlorhexidine Gluconate Cloth  6 each Topical Q0600    Infusions:    PRN Medications: acetaminophen, antiseptic oral rinse, bisacodyl, diphenhydrAMINE, glycopyrrolate, HYDROmorphone (DILAUDID) injection, LORazepam, ondansetron **OR** ondansetron (ZOFRAN) IV, oxyCODONE-acetaminophen, polyvinyl alcohol, promethazine Patient Profile   57 YO AAM w/ ischemic cardiomyopathy s/p HMIII LVAD, ESRD on iHD and b/l AKA admitted with VT storm. Mr. Cancio is well known to the heart failure service. Over the past several weeks he has been admitted several times with refractory VT and progressive RV failure. Despite multiple attempts at rhythm control, management of volume status with inpatient iHD, etc his RV failure and arrhythmia burden continues to progress. Today he had 4 ICD shocks during dialysis with an additional two over the weekend. He was brought to [facility] for further evaluation.   Assessment/Plan:   1. Refractory VT w/ Multiple ICD Shocks  -  3rd admit in 4 wks w/ VT storm, in setting of worsening RV Failure - medication refractory, on amiodarone + quinidine. EP has seen previous 2 hospitalizations and felt noting left to offer - now full comfort. In VT currently, tolerating ok w/ VAD still active - Anti arrhythmics stopped. ICD deactivated   2. End Stage Biventricular Heart Failure, s/p HM IIILVAD - Ischemic CM. S/p HM3 2018  - He had pump thrombosis 6/19 due to [facility] outflow graft kinking.  He had pump exchange for another HM3  - has struggled w/ worsening RV Failure. Recent echo 6/25 LVEF < 20%, RV severely reduced. This is  further c/b ESRD on HD.  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 No labs.    3. ESRD - he has stopped HD    4. PAD - s/p bilateral AKAs   5. H/o Repeat DL Infections - abx stopped w/ transition to comfort care    6. CAD - s/p anterior MI in 2010 and NSTEMI in 3/18 with DES to pRCA and mLAD  7. GOC Palliative Care following. Appreciate input.  ICD is off. Has stopped iHD. Wants to keep VAD activated. Discussed as he gets worse we can add comfort drips prior to pump deactivation.  - He does not want to die at home. Prefers dying in the hospital rather than a hospice facility. Palliative care managing comfort meds.    I reviewed the LVAD parameters from today, and compared the results to the patient's prior recorded data.  No programming changes were made.  The LVAD is functioning within specified parameters.  The patient performs LVAD self-test daily.  LVAD interrogation was negative for any significant power changes, alarms or PI events/speed drops.  LVAD equipment check completed and is in good working order.  Back-up equipment present.   LVAD education done on emergency procedures and precautions and reviewed exit site care.  Length of Stay: 4  Jemarcus Dougal, NP 02/24/2023, 8:21 AM  VAD Team --- VAD ISSUES ONLY--- Pager [phone] (7am - 7am)  Advanced Heart Failure Team  Pager [phone] (M-F; 7a - 5p)  Please contact [facility] for night-coverage after hours (5p -7a ) and weekends on [email or web address]

## 2023-02-24 NOTE — Progress Notes (Signed)
LVAD Coordinator Rounding Note:  Admitted 02/12/2023 due to VT storm and multiple ICD shocks.   HM 3 LVAD implanted on 03/01/18 by Dr Donata Clay under destination therapy criteria.  Met with pt at bedside. His wife is currently visiting. Awake and alert this morning. Endorses he is comfortable at this time. All questions answered at this time. Denies complaints.   He states he is not ready to deactivate VAD at this time, but wishes to remain in the hospital until he passes. ICD deactivated. Palliative care following.  Vital signs: (comfort measures only starting 6/18)   HR: 125 VT  SpO2: 97 RA  LVAD interrogation reveals:  Speed: 5300 Flow: 2.8 Power: 3.8 w PI: 4.2  Alarms: none Events: 10 PI events today  Hematocrit: 20- DO NOT TITRATE  Fixed speed: 5300 Low speed limit: 5000  Drive Line: Dressing clean, dry, and intact. Anchor correctly applied. Weekly dressing changes using daily kit. Next dressing change due 02/25/23 per bedside RN.   Labs: (comfort measures only starting 6/18) LDH trend: 151  INR trend: 1.6  Anticoagulation Plan: -INR Goal: 2.0 - 3.0 (Coumadin stopped 6/18) -ASA Dose: 81 mg daily (Stopped 6/18)  Device: -St Jude -Therapies: turned off 6/17 - Pacing: on            Arrythmias: hx of recurrent VT. Started on Quinidine 01/27/23.   Infection:   Adverse Events on VAD: -Recent admissions for VT. Started on Quinidine.   Plan/Recommendations:  1. Page VAD coordinator for any equipment or drive line issues 2. Weekly drive line dressing changes per bedside RN  Simmie Davies RN,BSN VAD Coordinator  Office: [phone]  24/7 Pager: [phone]

## 2023-02-25 DIAGNOSIS — R69 Illness, unspecified: Secondary | ICD-10-CM | POA: Diagnosis not present

## 2023-02-25 DIAGNOSIS — I472 Ventricular tachycardia, unspecified: Secondary | ICD-10-CM | POA: Diagnosis not present

## 2023-02-25 DIAGNOSIS — Z95811 Presence of heart assist device: Secondary | ICD-10-CM | POA: Diagnosis not present

## 2023-02-25 DIAGNOSIS — Z515 Encounter for palliative care: Secondary | ICD-10-CM | POA: Diagnosis not present

## 2023-02-25 MED ORDER — HYDROMORPHONE HCL 1 MG/ML IJ SOLN
0.5000 mg | INTRAMUSCULAR | Status: DC | PRN
  Administered 2023-02-25: 0.5 mg via INTRAVENOUS
  Administered 2023-02-26 (×3): 1 mg via INTRAVENOUS
  Administered 2023-02-27: 0.5 mg via INTRAVENOUS
  Filled 2023-02-25 (×2): qty 0.5
  Filled 2023-02-25 (×3): qty 1

## 2023-02-25 MED ORDER — LORAZEPAM 2 MG/ML IJ SOLN
1.0000 mg | INTRAMUSCULAR | Status: DC | PRN
  Administered 2023-02-26 – 2023-02-27 (×4): 1 mg via INTRAVENOUS
  Filled 2023-02-25 (×4): qty 1

## 2023-02-25 NOTE — Progress Notes (Addendum)
Daily Progress Note   Patient Name: [name]       Date: 02/25/2023 DOB: 06-Nov-1965  Age: 57 y.o. MRN#: [id number] Attending Physician: Dorthula Nettles, DO Primary Care Physician: Erick Alley, DO Admit Date: 2023-02-26  Reason for Consultation/Follow-up: Non pain symptom management, Pain control, Psychosocial/spiritual support, and Terminal Care  Subjective: I have reviewed medical records including EPIC notes, MAR, and labs. PRN comfort medications administered in the last 24 hours: dilaudid x2, ativan x3. Received report from primary RN - no acute concerns. RN reports patient's breathing is slightly more labored at times.  Went to visit patient at bedside. He was lying in bed asleep - I did not attempt to wake him to preserve comfort. No signs or non-verbal gestures of pain or discomfort noted. No respiratory distress, increased work of breathing, or secretions noted.    Length of Stay: 5  Current Medications: Scheduled Meds:    Continuous Infusions:   PRN Meds: acetaminophen, antiseptic oral rinse, bisacodyl, diphenhydrAMINE, glycopyrrolate, HYDROmorphone (DILAUDID) injection, LORazepam, ondansetron **OR** ondansetron (ZOFRAN) IV, oxyCODONE-acetaminophen, polyvinyl alcohol, promethazine, white petrolatum  Physical Exam Vitals and nursing note reviewed.  Constitutional:      General: He is not in acute distress.    Appearance: He is ill-appearing.  Pulmonary:     Effort: No respiratory distress.  Neurological:     Motor: Weakness present.             Vital Signs: BP 103/83 (BP Location: Right Arm)   Pulse (!) 30   Temp 97.7 F (36.5 C) (Oral)   Resp 15   Wt 85.2 kg   SpO2 97%   BMI 28.56 kg/m  SpO2: SpO2: 97 % O2 Device: O2 Device: Room Air O2 Flow Rate:     Intake/output summary: No intake or output data in the 24 hours ending 02/25/23 1526 LBM: Last BM Date : 02/24/23 Baseline Weight: Weight: 85.2 kg Most recent weight: Weight: 85.2 kg       Palliative Assessment/Data: PPS 30%      Patient Active Problem List   Diagnosis Date Noted   Ventricular tachycardia (HCC) 2023-02-26   AMS (altered mental status) 02/03/2023   Hemodialysis-associated hypotension 02/03/2023   VT (ventricular tachycardia) (HCC) 01/27/2023   Unspecified atrial flutter (HCC) 12/01/2022   Skin lesion of cheek 08/18/2022  Wheelchair dependence 08/06/2022   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HCC) 12/29/2021   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/24/2020   Penile ulcer 09/19/2019   PAD (peripheral artery disease) (HCC) 07/23/2019   Warfarin anticoagulation 04/15/2019   Heart failure (HCC) 05/23/2018   Benign essential HTN    Crohn's disease with complication (HCC)    Diabetes mellitus type 2 in nonobese (HCC)    Anemia of chronic disease    Sinus tachycardia    Factor V Leiden carrier (HCC) 01/10/2018   History of creation of ostomy (HCC) 01/10/2018   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) 07/10/2017   Chronic systolic CHF (congestive heart failure) (HCC) 06/07/2017   DNR (do not resuscitate) discussion    CHF exacerbation (HCC) 04/05/2017   Diabetic keto-acidosis (HCC) 04/05/2017   ESRD on dialysis (HCC) 01/17/2017   Non-ST elevation (NSTEMI) myocardial infarction [facility])    CHF (congestive heart failure) (HCC) 09/02/2016   SVT (supraventricular tachycardia) 04/26/2012   Gastroparesis 11/18/2011   Coronary artery disease    Ischemic cardiomyopathy    Essential hypertension    Automatic implantable cardioverter-defibrillator in situ    POLYP, COLON 09/25/2008   DIVERTICULOSIS, COLON 09/25/2008   History of Crohn's disease 09/05/2006    Palliative Care Assessment & Plan    Patient Profile: 57 y.o. male  with past medical history of ischemic cardiomyopathy s/p HMIII LVAD, ESRD on iHD and b/l AKA admitted on 2023/02/26 with VT storm.    Patient was shocked a total of 6 times.  Over the past few weeks he has been admitted 3 times for refractory VT and progressive RV failure. Despite multiple attempts at rhythm control, management of volume status with inpatient iHD, etc his RV failure and arrhythmia burden continues to progress.  Patient expressed interest in going home with his ICD deactivated.  Assessment: Principal Problem:   Ventricular tachycardia (HCC)   Terminal care  Recommendations/Plan: Continue full comfort measures Continue DNR/DNI as previously documented Comfort medications adjusted as noted below - patient declining, will continue to make adjustments as needed Anticipate hospital death. Patient does not want transfer to hospice facility or to return home with hospice Per cardiology notes, when patient becomes unresponsive he is agreeable to have VAD deactivated. Agree with initiating opioid infusion prior to this to prevent suffering PMT will continue to follow and support holistically  Symptom Management Adjusted Dilaudid to dose range 0.5-1mg  PRN pain/dyspnea/increased work of breathing/RR>25 Tylenol PRN pain/fever Biotin PRN dry mouth Benadryl PRN itching Robinul PRN secretions Increased ativan frequency to q1h PRN anxiety/seizure/sleep/distress Zofran PRN nausea/vomiting Percocet PRN pain Phenergan RN nausea/vomiting Liquifilm Tears PRN dry eye   Goals of Care and Additional Recommendations: Limitations on Scope of Treatment: Full Comfort Care  Code Status:    Code Status Orders  (From admission, onward)           Start     Ordered   02/21/23 1150  Do not attempt resuscitation (DNR)  Continuous       Question Answer Comment  If patient has no pulse and is not breathing Do Not Attempt Resuscitation   If patient has a  pulse and/or is breathing: Medical Treatment Goals COMFORT MEASURES: Keep clean/warm/dry, use medication by any route; positioning, wound care and other measures to relieve pain/suffering; use oxygen, suction/manual treatment of airway obstruction for comfort; do not transfer unless for comfort needs.   Consent: Discussion documented in EHR or advanced directives reviewed  02/21/23 1155           Code Status History     Date Active Date Inactive Code Status Order ID Comments User Context   02/04/2023 1153 02/21/2023 1155 Partial Code [id number] Ventricular Assist Device in place.  No Chest Compressions. Allayne Butcher, [place] ED   02/03/2023 1107 02/13/2023 2051 Partial Code [id number] Ventricular Assist Device in place.  No Chest Compressions. Allayne Butcher, [place] ED   01/27/2023 1125 01/30/2023 1903 Partial Code [id number] Ventricular Assist Device in place.  No Chest Compressions. Sherald Hess, NP ED   07/22/2019 1423 07/30/2019 1540 Partial Code [id number] Ventricular Assist Device in place.  No Chest Compressions. Allayne Butcher, [place] Inpatient   05/28/2019 1055 06/16/2019 2008 Partial Code [id number] Ventricular Assist Device in place.  No Chest Compressions. Sherald Hess, NP Inpatient   04/26/2019 1445 05/13/2019 1811 Partial Code [id number] Ventricular Assist Device in place.  No Chest Compressions. Sherald Hess, NP Inpatient   03/27/2019 1839 04/04/2019 1240 Partial Code [id number] Ventricular Assist Device in place.  No Chest Compressions. Sherald Hess, NP Inpatient   03/15/2019 1706 03/23/2019 0110 Partial Code [id number] Ventricular Assist Device in place.  No Chest Compressions. Laurey Morale, MD Inpatient   08/28/2018 2142 09/03/2018 1723 Partial Code [id number] Ventricular Assist Device in place.  No Chest Compressions. Dolores Patty, MD Inpatient   08/22/2018 1531 08/27/2018 1948 Partial Code [id number] Ventricular Assist Device in place.  No Chest Compressions. [employer] Inpatient   08/08/2018 1237 08/18/2018 1530 Partial Code [id number] Ventricular Assist Device in place.  No Chest Compressions. Alford Highland, NP Inpatient   06/11/2018 1518 06/13/2018 2322 Partial Code [id number] Ventricular Assist Device in place.  No Chest Compressions. Alford Highland, NP ED   05/23/2018 1617 06/05/2018 1709 Partial Code [id number] Ventricular Assist Device in place.  No Chest Compressions. Sherald Hess, NP Inpatient   03/23/2018 1542 03/25/2018 1607 Partial Code [id number] Ventricular Assist Device in place.  No Chest Compressions. [employer]ane [employer] Inpatient   03/01/2018 6160 03/19/2018 1727 Partial Code [id number] Ventricular Assist Device in place. Kerin Perna, MD Inpatient   03/01/2018 0018 03/01/2018 0924 Partial Code [id number] Ventricular Assist Device in place.  No Chest Compressions. Dolores Patty, MD Inpatient   02/08/2018 1207 02/08/2018 1832 Full Code [id number]  Laurey Morale, MD Inpatient   01/19/2018 1504 01/22/2018 2231 Partial Code [id number] Ventricular Assist Device in place.  No Chest Compressions. Sherald Hess, NP ED   12/20/2017 2041 12/22/2017 1942 Partial Code [id number] Ventricular Assist Device in place.  No Chest Compressions. Sherald Hess, NP Inpatient   12/11/2017 1202 12/18/2017 2034 Partial Code [id number] Ventricular Assist Device in place.  No Chest Compressions. [employer] Inpatient   10/29/2017 1046 11/04/2017 1627 Partial Code [id number] Ventricular Assist Device in place.  No Chest Compressions. Sherald Hess, NP Inpatient   09/12/2017 1316 09/16/2017 2033 Partial Code [id number] Ventricular Assist Device in place.  No Chest Compressions. Sherald Hess, NP Inpatient   07/31/2017 0352 08/04/2017 1739 Partial Code [id number] Ventricular Assist Device in place.  No Chest Compressions. Dolores Patty, MD ED   07/10/2017 1327 07/16/2017 1505 Partial Code [id number] Ventricular Assist Device in place.  No Chest  Compressions. [employer] Inpatient   06/12/2017 1331 07/03/2017 1704 Partial Code [id number] Ventricular Assist Device in place. Kerin Perna, MD Inpatient   06/07/2017 1131 06/12/2017 1331 Full Code [id number]  Laurey Morale, MD Inpatient   04/05/2017 2129 04/13/2017 2003 Full Code [id number]  Beaulah Dinning, MD ED   02/08/2017 1426 02/13/2017 1509 Full Code [id number]  Little Ishikawa, NP Inpatient   12/05/2016 1748 12/06/2016 1735 Full Code [id number]  Manson Passey, PA Inpatient   11/18/2016 1612 11/28/2016 1726 Full Code [id number]  Jake Bathe, MD Inpatient   09/14/2016 1249 09/14/2016 1649 Full Code [id number]  Duke Salvia, MD Inpatient   08/20/2016 1628 08/22/2016 2113 Full Code [id number]  Berton Bon, MD Inpatient   11/17/2011 1651 11/18/2011 2051 Full Code [id number]  Barbera Setters, RN Inpatient       Prognosis:  Days  Discharge Planning: Anticipated Hospital Death  Care plan was discussed with primary RN  Thank you for allowing the Palliative Medicine Team to assist in the care of this patient.   Haskel Khan, NP  Please contact Palliative Medicine Team phone at [phone] for questions and concerns.   *Portions of this note are a verbal dictation therefore any spelling and/or grammatical errors are due to the "[employer]agon [employer] One" system interpretation.

## 2023-02-25 NOTE — Progress Notes (Signed)
Advanced Heart Failure VAD Team Note  PCP-Cardiologist: None   Subjective:    Remains on comfort care. Anuric. Denies CP or SOB    Got some morphine this am and is a bit lethargic but communicative.   Not ready to deactivate LVAD.   LVAD INTERROGATION:  HeartMate III LVAD:   Flow 3.0 liters/min, speed 5300, power 4.0  PI 3.1 VAD interrogated personally. Parameters stable.  Objective:    Vital Signs:   Temp:  [97.7 F (36.5 C)] 97.7 F (36.5 C) (06/21 2317) Pulse Rate:  [30-126] 30 (06/22 0400) Resp:  [0-15] 15 (06/22 0400) BP: (103)/(83) 103/83 (06/21 2317) SpO2:  [92 %-97 %] 97 % (06/22 0400) Last BM Date : 02/24/23 Mean arterial Pressure N/A. comfort  Intake/Output:  No intake or output data in the 24 hours ending 02/25/23 1210  Physical Exam   General:  Weak NAD. + Face swollen HEENT: normal  Neck: supple. JVP up.  Carotids 2+ bilat; no bruits. No lymphadenopathy or thryomegaly appreciated. Cor: LVAD hum.  + tunneled cat Lungs: Clear. Abdomen: obese soft, nontender, non-distended. No hepatosplenomegaly. No bruits or masses. Good bowel sounds. Driveline site clean. Anchor in place.  Extremities: no cyanosis, clubbing, rash. + bilateral amputation + edema Neuro: lethargic but can have conversation. No asterixis   Telemetry   Disconnected  EKG    N/A   Labs   Basic Metabolic Panel: Recent Labs  Lab 02/06/2023 1118 02/21/23 0003  NA 137 136  K 3.1* 3.7  CL 95* 94*  CO2 26 27  GLUCOSE 109* 126*  BUN 16 22*  CREATININE 4.35* 5.41*  CALCIUM 7.7* 8.2*  MG 1.8  --   PHOS 2.1*  --      Liver Function Tests: No results for input(s): "AST", "ALT", "ALKPHOS", "BILITOT", "PROT", "ALBUMIN" in the last 168 hours. No results for input(s): "LIPASE", "AMYLASE" in the last 168 hours. No results for input(s): "AMMONIA" in the last 168 hours.  CBC: Recent Labs  Lab 02/06/2023 1118 02/21/23 0003  WBC 8.5 6.7  NEUTROABS 6.7  --   HGB 9.0* 8.5*  HCT 27.0*  25.6*  MCV 82.3 81.5  PLT 95* 100*     INR: Recent Labs  Lab 03/02/2023 1118 02/21/23 0003  INR 1.5* 1.6*    Other results: EKG:   Imaging   No results found.  Medications:     Scheduled Medications:    Infusions:    PRN Medications: acetaminophen, antiseptic oral rinse, bisacodyl, diphenhydrAMINE, glycopyrrolate, HYDROmorphone (DILAUDID) injection, LORazepam, ondansetron **OR** ondansetron (ZOFRAN) IV, oxyCODONE-acetaminophen, polyvinyl alcohol, promethazine, white petrolatum Patient Profile   57 YO AAM w/ ischemic cardiomyopathy s/p HMIII LVAD, ESRD on iHD and b/l AKA admitted with VT storm. [name] is well known to the heart failure service. Over the past several weeks he has been admitted several times with refractory VT and progressive RV failure. Despite multiple attempts at rhythm control, management of volume status with inpatient iHD, etc his RV failure and arrhythmia burden continues to progress. Today he had 4 ICD shocks during dialysis with an additional two over the weekend. He was brought to [facility] for further evaluation.   Assessment/Plan:   1. Refractory VT w/ Multiple ICD Shocks  - 3rd admit in 4 wks w/ VT storm, in setting of worsening RV Failure - medication refractory, on amiodarone + quinidine. EP has seen previous 2 hospitalizations and felt noting left to offer - now full comfort. In VT currently, tolerating ok w/ VAD  still active - Anti arrhythmics stopped. ICD deactivated   2. End Stage Biventricular Heart Failure, s/p HM IIILVAD - Ischemic CM. S/p HM3 2018  - He had pump thrombosis 6/19 due to [facility] outflow graft kinking.  He had pump exchange for another HM3  - has struggled w/ worsening RV Failure. Recent echo 6/25 LVEF < 20%, RV severely reduced. This is further c/b ESRD on HD.  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 No labs.     3. ESRD - he has stopped HD    4. PAD - s/p bilateral AKAs  5. GOC Palliative Care following. Appreciate input.  ICD is off. Has stopped iHD. Wants to keep VAD activated. Discussed as he gets worse we can add comfort drips prior to pump deactivation.  - He does not want to die at home. Prefers dying in the hospital rather than a hospice facility. Palliative care managing comfort meds.   He is nearing the end. I spoke with him and his sister-in-law suspect he will require morphine drip soon to prevent suffering. Once he is comatose he is agreeable to having VAD deactivated.  He has a sister in [place] that is planning to come Wednesday. I made it very clear that he will likely not be alive on Wednesday and that if they want to have a meaningful conversation she needs to come today or do it by FaceTime.   Total time spent 40 minutes. Over half that time spent discussing above.     I reviewed the LVAD parameters from today, and compared the results to the patient's prior recorded data.  No programming changes were made.  The LVAD is functioning within specified parameters.  The patient performs LVAD self-test daily.  LVAD interrogation was negative for any significant power changes, alarms or PI events/speed drops.  LVAD equipment check completed and is in good working order.  Back-up equipment present.   LVAD education done on emergency procedures and precautions and reviewed exit site care.  Length of Stay: 5  Arvilla Meres, MD 02/25/2023, 12:10 PM  VAD Team --- VAD ISSUES ONLY--- Pager [phone] (7am - 7am)  Advanced Heart Failure Team  Pager [phone] (M-F; 7a - 5p)  Please contact [facility] for night-coverage after hours (5p -7a ) and weekends on [email or web address]

## 2023-02-26 DIAGNOSIS — R69 Illness, unspecified: Secondary | ICD-10-CM | POA: Diagnosis not present

## 2023-02-26 DIAGNOSIS — Z95811 Presence of heart assist device: Secondary | ICD-10-CM | POA: Diagnosis not present

## 2023-02-26 DIAGNOSIS — Z515 Encounter for palliative care: Secondary | ICD-10-CM | POA: Diagnosis not present

## 2023-02-26 DIAGNOSIS — I472 Ventricular tachycardia, unspecified: Secondary | ICD-10-CM | POA: Diagnosis not present

## 2023-02-26 NOTE — Progress Notes (Signed)
Advanced Heart Failure VAD Team Note  PCP-Cardiologist: None   Subjective:    Remains on comfort care. Receiving PRN ativan and dilaudid. Anuric.   Had multiple low flow alarms on VAD last night   Now resting comfortably. Seen by Palliative Care this am. Confirmed that he wants LVAD off when he is comatose but not ready yet   Currently sleeping but arousable   LVAD INTERROGATION:  HeartMate III LVAD:   Flow 2.9 liters/min, speed 5300, power 4.0  PI 2.7 VAD interrogated personally. Multiple low flow alarms overnight  Objective:    Vital Signs:   Temp:  [97.6 F (36.4 C)-98 F (36.7 C)] 98 F (36.7 C) (06/23 0800) Resp:  [17] 17 (06/23 0800) BP: (120-126)/(108-115) 120/108 (06/23 0800) SpO2:  [95 %] 95 % (06/23 0800) Last BM Date : 02/24/23 Mean arterial Pressure N/A. comfort  Intake/Output:   Intake/Output Summary (Last 24 hours) at 02/26/2023 1026 Last data filed at 02/26/2023 0700 Gross per 24 hour  Intake 120 ml  Output --  Net 120 ml    Physical Exam   General:  Weak NAD. + Face swollen Sleepy but arousable HEENT: normal  Neck: supple. JVP to earCarotids 2+ bilat; no bruits. No lymphadenopathy or thryomegaly appreciated. Cor: LVAD hum.  + TDC Lungs: Clear. Abdomen:  soft, nontender, + distended. No hepatosplenomegaly. No bruits or masses. Good bowel sounds. Driveline site clean. Anchor in place.  Extremities: no cyanosis, clubbing, rash. + bilateral amp + edema in stumps  Neuro: Lethargic but arousable     Telemetry   Disconnected  EKG    N/A   Labs   Basic Metabolic Panel: Recent Labs  Lab 02/17/2023 1118 02/21/23 0003  NA 137 136  K 3.1* 3.7  CL 95* 94*  CO2 26 27  GLUCOSE 109* 126*  BUN 16 22*  CREATININE 4.35* 5.41*  CALCIUM 7.7* 8.2*  MG 1.8  --   PHOS 2.1*  --      Liver Function Tests: No results for input(s): "AST", "ALT", "ALKPHOS", "BILITOT", "PROT", "ALBUMIN" in the last 168 hours. No results for input(s): "LIPASE",  "AMYLASE" in the last 168 hours. No results for input(s): "AMMONIA" in the last 168 hours.  CBC: Recent Labs  Lab 02/13/2023 1118 02/21/23 0003  WBC 8.5 6.7  NEUTROABS 6.7  --   HGB 9.0* 8.5*  HCT 27.0* 25.6*  MCV 82.3 81.5  PLT 95* 100*     INR: Recent Labs  Lab 02/15/2023 1118 02/21/23 0003  INR 1.5* 1.6*    Other results:   Imaging   No results found.  Medications:     Scheduled Medications:    Infusions:    PRN Medications: acetaminophen, antiseptic oral rinse, bisacodyl, diphenhydrAMINE, glycopyrrolate, HYDROmorphone (DILAUDID) injection, LORazepam, ondansetron **OR** ondansetron (ZOFRAN) IV, oxyCODONE-acetaminophen, polyvinyl alcohol, promethazine, white petrolatum Patient Profile   57 YO AAM w/ ischemic cardiomyopathy s/p HMIII LVAD, ESRD on iHD and b/l AKA admitted with VT storm. [name] is well known to the heart failure service. Over the past several weeks he has been admitted several times with refractory VT and progressive RV failure. Despite multiple attempts at rhythm control, management of volume status with inpatient iHD, etc his RV failure and arrhythmia burden continues to progress. Today he had 4 ICD shocks during dialysis with an additional two over the weekend. He was brought to [facility] for further evaluation.   Assessment/Plan:   1. Refractory VT w/ Multiple ICD Shocks  - 3rd admit in  4 wks w/ VT storm, in setting of worsening RV Failure - medication refractory, on amiodarone + quinidine. EP has seen previous 2 hospitalizations and felt noting left to offer - now full comfort. In VT currently, tolerating ok w/ VAD still active - Anti arrhythmics stopped. ICD deactivated   2. End Stage Biventricular Heart Failure, s/p HM IIILVAD - Ischemic CM. S/p HM3 2018  - He had pump thrombosis 6/19 due to [facility] outflow graft kinking.  He had pump exchange for another HM3  - has struggled w/ worsening RV Failure. Recent echo 6/25 LVEF < 20%, RV  severely reduced. This is further c/b ESRD on HD.  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 No labs.    3. ESRD - he has stopped HD    4. PAD - s/p bilateral AKAs  5. GOC Palliative Care following. Appreciate input.  ICD is off. Has stopped iHD. Wants to keep VAD activated. Discussed as he gets worse we can add comfort drips prior to pump deactivation.  - He does not want to die at home. Prefers dying in the hospital rather than a hospice facility. Palliative care managing comfort meds.   He is nearing the end. I spoke with him and his sister-in-law again at bedside. She confirmed discussions with Palliative Care.  Once he is comatose he is agreeable to having VAD deactivated.  He has a sister in [place] that is planning to come Wednesday. I again made it very clear that he will likely not be alive on Wednesday and that if they want to see each other in person she needs to come sooner. She was able to talk with him by phone today.   Total time spent 40 minutes. Over half that time spent discussing above.     I reviewed the LVAD parameters from today, and compared the results to the patient's prior recorded data.  No programming changes were made.  The LVAD is functioning within specified parameters.  The patient performs LVAD self-test daily.  LVAD interrogation was negative for any significant power changes, alarms or PI events/speed drops.  LVAD equipment check completed and is in good working order.  Back-up equipment present.   LVAD education done on emergency procedures and precautions and reviewed exit site care.  Length of Stay: 6  [name] Meres, MD 02/26/2023, 10:26 AM  VAD Team --- VAD ISSUES ONLY--- Pager [phone] (7am - 7am)  Advanced Heart Failure Team  Pager [phone] (M-F; 7a - 5p)  Please contact [facility] for night-coverage after hours (5p -7a ) and  weekends on [email or web address]

## 2023-02-26 NOTE — Progress Notes (Addendum)
Daily Progress Note   Patient Name: [name]       Date: 02/26/2023 DOB: [birth year]  Age: 57 y.o. MRN#: [id number] Attending Physician: [name] Nettles, DO Primary Care Physician: [name] Alley, DO Admit Date: 03-07-2023  Reason for Consultation/Follow-up: Non pain symptom management, Pain control, Psychosocial/spiritual support, and Terminal Care  Subjective: I have reviewed medical records including EPIC notes, MAR, and labs. PRN comfort medications administered in the last 24 hours: dilaudid x3, ativan x4, zofran x1. Received report from primary RN - no acute concerns. RN reports patient is not eating or drinking.  Went to visit patient at bedside - sister in law/[name] present. Patient was lying in bed awake, though most of his interactions are with eyes closed. No signs or non-verbal gestures of pain or discomfort noted. No respiratory distress, increased work of breathing, or secretions noted. Patient indicates he is having "a little discomfort" on his right rib area. He requests pain medication and ativan.   Patient tells me "I am ready for comfort care." I ask him to eleborate on what this means to him - he tells me "the beginning of the end." Education provided on comfort measures in house and explained that for several days we have been focused only on how he is looking and feeling. We discussed if he had changed his thoughts on LVAD - confirmed he is not yet ready to turn it off. He will be ready to initiate comfort drips and deactivate it when he is no longer able to have meaningful interactions with family/friends. Patient and family at bedside expressed understanding of comfort focused care until this time. Reviewed medication adjustments made yesterday.  [name] requests to be  called Monday 6/24 in the event of any major changes, if no family are present - she works at [employer] and can get to hospital fast. [phone].  All questions and concerns addressed. Encouraged to call with questions and/or concerns. PMT card provided.  Requested RN administer dilaudid and ativan. She questions when patient will be full comfort care. Education provided that patient's focus is comfort only. Reviewed purple MEWS and comfort orders. Clarified goals around LVAD as outlined above. RN aware of [name]'s request for Monday.  Length of Stay: 6  Current Medications: Scheduled Meds:    Continuous Infusions:   PRN Meds: acetaminophen, antiseptic oral  rinse, bisacodyl, diphenhydrAMINE, glycopyrrolate, HYDROmorphone (DILAUDID) injection, LORazepam, ondansetron **OR** ondansetron (ZOFRAN) IV, oxyCODONE-acetaminophen, polyvinyl alcohol, promethazine, white petrolatum  Physical Exam Vitals and nursing note reviewed.  Constitutional:      General: He is not in acute distress.    Appearance: He is ill-appearing.  Pulmonary:     Effort: No respiratory distress.  Neurological:     Motor: Weakness present.             Vital Signs: BP (!) 120/108 (BP Location: Right Arm)   Pulse (!) 30   Temp 98 F (36.7 C) (Axillary)   Resp 17   Wt 85.2 kg   SpO2 95%   BMI 28.56 kg/m  SpO2: SpO2: 95 % O2 Device: O2 Device: Room Air O2 Flow Rate:    Intake/output summary:  Intake/Output Summary (Last 24 hours) at 02/26/2023 1026 Last data filed at 02/26/2023 0700 Gross per 24 hour  Intake 120 ml  Output --  Net 120 ml   LBM: Last BM Date : 02/24/23 Baseline Weight: Weight: 85.2 kg Most recent weight: Weight: 85.2 kg       Palliative Assessment/Data: PPS 10%      Patient Active Problem List   Diagnosis Date Noted   Ventricular tachycardia (HCC) 02/05/2023   AMS (altered mental status) 02/03/2023   Hemodialysis-associated hypotension 02/03/2023   VT (ventricular  tachycardia) (HCC) 01/27/2023   Unspecified atrial flutter (HCC) 12/01/2022   Skin lesion of cheek 08/18/2022   Wheelchair dependence 08/06/2022   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HCC) 12/29/2021   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/24/2020   Penile ulcer 09/19/2019   PAD (peripheral artery disease) (HCC) 07/23/2019   Warfarin anticoagulation 04/15/2019   Heart failure (HCC) 05/23/2018   Benign essential HTN    Crohn's disease with complication (HCC)    Diabetes mellitus type 2 in nonobese (HCC)    Anemia of chronic disease    Sinus tachycardia    Factor V Leiden carrier (HCC) 01/10/2018   History of creation of ostomy (HCC) 01/10/2018   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) 07/10/2017   Chronic systolic CHF (congestive heart failure) (HCC) 06/07/2017   DNR (do not resuscitate) discussion    CHF exacerbation (HCC) 04/05/2017   Diabetic keto-acidosis (HCC) 04/05/2017   ESRD on dialysis (HCC) 01/17/2017   Non-ST elevation (NSTEMI) myocardial infarction [facility])    CHF (congestive heart failure) (HCC) 09/02/2016   SVT (supraventricular tachycardia) 04/26/2012   Gastroparesis 11/18/2011   Coronary artery disease    Ischemic cardiomyopathy    Essential hypertension    Automatic implantable cardioverter-defibrillator in situ    POLYP, COLON 09/25/2008   DIVERTICULOSIS, COLON 09/25/2008   History of Crohn's disease 09/05/2006    Palliative Care Assessment & Plan   Patient Profile: 57 y.o. male  with past medical history of ischemic cardiomyopathy s/p HMIII LVAD, ESRD on iHD and b/l AKA admitted on 02/09/2023 with VT storm.    Patient was shocked a total of 6 times.  Over the past few weeks he has been admitted 3 times for refractory VT and progressive RV failure. Despite multiple attempts at rhythm control, management of volume status with inpatient iHD, etc his RV failure and arrhythmia burden continues to  progress.  Patient expressed interest in going home with his ICD deactivated.    Assessment: Principal Problem:   Ventricular tachycardia (HCC)   Terminal care  Recommendations/Plan: Continue full comfort measures Continue DNR/DNI as  previously documented Anticipate hospital death. Patient does not want transfer to hospice facility or to return home with hospice  When patient becomes unresponsive and unable to have meaningful interactions with family/friends, plan is for initiation of opioid infusion and LVAD deactivation Continue current comfort focused medication regimen - no changes PMT will continue to follow and support holistically  Symptom Management Dilaudid PRN pain/dyspnea/increased work of breathing/RR>25 Tylenol PRN pain/fever Biotin PRN dry mouth Benadryl PRN itching Robinul PRN secretions Ativan PRN anxiety/seizure/sleep/distress Zofran PRN nausea/vomiting Percocet PRN pain Phenergan RN nausea/vomiting Liquifilm Tears PRN dry eye  Goals of Care and Additional Recommendations: Limitations on Scope of Treatment: Full Comfort Care  Code Status:    Code Status Orders  (From admission, onward)           Start     Ordered   02/21/23 1150  Do not attempt resuscitation (DNR)  Continuous       Question Answer Comment  If patient has no pulse and is not breathing Do Not Attempt Resuscitation   If patient has a pulse and/or is breathing: Medical Treatment Goals COMFORT MEASURES: Keep clean/warm/dry, use medication by any route; positioning, wound care and other measures to relieve pain/suffering; use oxygen, suction/manual treatment of airway obstruction for comfort; do not transfer unless for comfort needs.   Consent: Discussion documented in EHR or advanced directives reviewed      02/21/23 1155           Code Status History     Date Active Date Inactive Code Status Order ID Comments User Context   02/28/2023 1153 02/21/2023 1155 Partial Code [id number]  Ventricular Assist Device in place.  No Chest Compressions. Allayne Butcher, [place] ED   02/03/2023 1107 02/13/2023 2051 Partial Code [id number] Ventricular Assist Device in place.  No Chest Compressions. Allayne Butcher, [place] ED   01/27/2023 1125 01/30/2023 1903 Partial Code [id number] Ventricular Assist Device in place.  No Chest Compressions. Sherald Hess, NP ED   07/22/2019 1423 07/30/2019 1540 Partial Code [id number] Ventricular Assist Device in place.  No Chest Compressions. Allayne Butcher, [place] Inpatient   05/28/2019 1055 06/16/2019 2008 Partial Code [id number] Ventricular Assist Device in place.  No Chest Compressions. Sherald Hess, NP Inpatient   04/26/2019 1445 05/13/2019 1811 Partial Code [id number] Ventricular Assist Device in place.  No Chest Compressions. Sherald Hess, NP Inpatient   03/27/2019 1839 04/04/2019 1240 Partial Code [id number] Ventricular Assist Device in place.  No Chest Compressions. Sherald Hess, NP Inpatient   03/15/2019 1706 03/23/2019 0110 Partial Code [id number] Ventricular Assist Device in place.  No Chest Compressions. Laurey Morale, MD Inpatient   08/28/2018 2142 09/03/2018 1723 Partial Code [id number] Ventricular Assist Device in place.  No Chest Compressions. Dolores Patty, MD Inpatient   08/22/2018 1531 08/27/2018 1948 Partial Code [id number] Ventricular Assist Device in place.  No Chest Compressions. [employer]ane [employer] Inpatient   08/08/2018 1237 08/18/2018 1530 Partial Code [id number] Ventricular Assist Device in place.  No Chest Compressions. Alford Highland, NP Inpatient   06/11/2018 1518 06/13/2018 2322 Partial Code [id number] Ventricular Assist Device in place.  No Chest Compressions. Alford Highland, NP ED   05/23/2018 1617 06/05/2018 1709 Partial Code [id number] Ventricular Assist Device in place.  No Chest Compressions. Sherald Hess, NP Inpatient   03/23/2018 1542 03/25/2018 1607 Partial Code [id number] Ventricular Assist Device in place.  No Chest  Compressions. Luane [employer] Inpatient   03/01/2018 6160 03/19/2018 1727  Partial Code [id number] Ventricular Assist Device in place. Kerin Perna, MD Inpatient   03/01/2018 0018 03/01/2018 0924 Partial Code [id number] Ventricular Assist Device in place.  No Chest Compressions. Dolores Patty, MD Inpatient   02/08/2018 1207 02/08/2018 1832 Full Code [id number]  Laurey Morale, MD Inpatient   01/19/2018 1504 01/22/2018 2231 Partial Code [id number] Ventricular Assist Device in place.  No Chest Compressions. Sherald Hess, NP ED   12/20/2017 2041 12/22/2017 1942 Partial Code [id number] Ventricular Assist Device in place.  No Chest Compressions. Sherald Hess, NP Inpatient   12/11/2017 1202 12/18/2017 2034 Partial Code [id number] Ventricular Assist Device in place.  No Chest Compressions. Luane [employer] Inpatient   10/29/2017 1046 11/04/2017 1627 Partial Code [id number] Ventricular Assist Device in place.  No Chest Compressions. Sherald Hess, NP Inpatient   09/12/2017 1316 09/16/2017 2033 Partial Code [id number] Ventricular Assist Device in place.  No Chest Compressions. Sherald Hess, NP Inpatient   07/31/2017 0352 08/04/2017 1739 Partial Code [id number] Ventricular Assist Device in place.  No Chest Compressions. Dolores Patty, MD ED   07/10/2017 1327 07/16/2017 1505 Partial Code [id number] Ventricular Assist Device in place.  No Chest Compressions. [employer]ane [employer] Inpatient   06/12/2017 1331 07/03/2017 1704 Partial Code [id number] Ventricular Assist Device in place. Kerin Perna, MD Inpatient   06/07/2017 1131 06/12/2017 1331 Full Code [id number]  Laurey Morale, MD Inpatient   04/05/2017 2129 04/13/2017 2003 Full Code [id number]  Beaulah Dinning, MD ED   02/08/2017 1426 02/13/2017 1509 Full Code [id number]  Little Ishikawa, NP Inpatient   12/05/2016 1748 12/06/2016 1735 Full Code [id number]  Manson Passey, PA Inpatient   11/18/2016 1612 11/28/2016 1726 Full Code [id number]  Jake Bathe, MD Inpatient   09/14/2016 1249 09/14/2016 1649 Full Code [id number]  Duke Salvia, MD Inpatient   08/20/2016 1628 08/22/2016 2113 Full Code [id number]  Berton Bon, MD Inpatient   11/17/2011 1651 11/18/2011 2051 Full Code [id number]  Barbera Setters, RN Inpatient       Prognosis:  Days  Discharge Planning: Anticipated Hospital Death  Care plan was discussed with primary RN, patient, patient's family  Thank you for allowing the Palliative Medicine Team to assist in the care of this patient.   Haskel Khan, NP  Please contact Palliative Medicine Team phone at [phone] for questions and concerns.   *Portions of this note are a verbal dictation therefore any spelling and/or grammatical errors are due to the "Dragon Medical One" system interpretation.

## 2023-02-26 NOTE — Plan of Care (Signed)
  Problem: Education: Goal: Patient will understand all VAD equipment and how it functions Outcome: Progressing   Problem: Cardiac: Goal: LVAD will function as expected and patient will experience no clinical alarms Outcome: Progressing   Problem: Clinical Measurements: Goal: Cardiovascular complication will be avoided Outcome: Progressing   Problem: Pain Managment: Goal: General experience of comfort will improve Outcome: Progressing

## 2023-02-27 DIAGNOSIS — Z95811 Presence of heart assist device: Secondary | ICD-10-CM | POA: Diagnosis not present

## 2023-02-27 DIAGNOSIS — Z789 Other specified health status: Secondary | ICD-10-CM

## 2023-02-27 DIAGNOSIS — I472 Ventricular tachycardia, unspecified: Secondary | ICD-10-CM | POA: Diagnosis not present

## 2023-02-27 DIAGNOSIS — Z515 Encounter for palliative care: Secondary | ICD-10-CM

## 2023-02-27 DIAGNOSIS — R69 Illness, unspecified: Secondary | ICD-10-CM | POA: Diagnosis not present

## 2023-02-27 DIAGNOSIS — Z7189 Other specified counseling: Secondary | ICD-10-CM

## 2023-02-27 DIAGNOSIS — Z66 Do not resuscitate: Secondary | ICD-10-CM

## 2023-02-27 MED ORDER — LORAZEPAM BOLUS VIA INFUSION
5.0000 mg | Freq: Once | INTRAVENOUS | Status: AC
  Administered 2023-02-27: 5 mg via INTRAVENOUS
  Filled 2023-02-27: qty 5

## 2023-02-27 MED ORDER — HYDROMORPHONE HCL-NACL 50-0.9 MG/50ML-% IV SOLN
5.0000 mg/h | INTRAVENOUS | Status: DC
  Administered 2023-02-27: 5 mg/h via INTRAVENOUS
  Filled 2023-02-27 (×2): qty 50

## 2023-02-27 MED ORDER — LORAZEPAM 2 MG/ML IJ SOLN
5.0000 mg/h | INTRAVENOUS | Status: DC
  Administered 2023-02-27: 5 mg/h via INTRAVENOUS
  Filled 2023-02-27 (×4): qty 25

## 2023-02-27 MED ORDER — HYDROMORPHONE BOLUS VIA INFUSION
5.0000 mg | Freq: Once | INTRAVENOUS | Status: AC
  Administered 2023-02-27: 5 mg via INTRAVENOUS
  Filled 2023-02-27: qty 5

## 2023-02-28 ENCOUNTER — Encounter (HOSPITAL_COMMUNITY): Payer: Medicare Other | Admitting: Cardiology

## 2023-03-06 NOTE — Progress Notes (Addendum)
Advanced Heart Failure VAD Team Note  PCP-Cardiologist: None   Subjective:    Sleepy but arousable.   LVAD INTERROGATION:  HeartMate III LVAD:   Flow 2.8 liters/min, speed 5300, power 4.0  PI 2.6 VAD interrogated personally.  ~ 30 PI events.   Objective:    Vital Signs:   Temp:  [98 F (36.7 C)-98.5 F (36.9 C)] 98.5 F (36.9 C) (06/23 2050) Pulse Rate:  [80] 80 (06/23 2050) Resp:  [17-18] 18 (06/23 2050) BP: (110-120)/(74-108) 110/74 (06/23 2050) SpO2:  [95 %-99 %] 99 % (06/23 2050) Last BM Date : 02/24/23 Mean arterial Pressure 80s  Intake/Output:   Intake/Output Summary (Last 24 hours) at 03/05/2023 0654 Last data filed at 02/26/2023 1700 Gross per 24 hour  Intake 120 ml  Output --  Net 120 ml   Physical Exam  Physical Exam: GENERAL: No acute distress. HEENT: normal  NECK: Supple, JVP  to jaw 2+ bilaterally, no bruits.  No lymphadenopathy or thyromegaly appreciated.   CARDIAC:  Mechanical heart sounds with LVAD hum present. R upper chest HD catheter.  LUNGS:  Clear to auscultation bilaterally.  ABDOMEN:  Soft, round, nontender, positive bowel sounds x4.     LVAD exit site:   Stabilization device present and accurately applied.  Driveline dressing is being changed daily per sterile technique. EXTREMITIES:  Warm and dry, no cyanosis, clubbing, rash . 1+edema  NEUROLOGIC: Eyes closed but arousable.  Telemetry   Disconnected  EKG    N/A   Labs   Basic Metabolic Panel: Recent Labs  Lab 02/18/2023 1118 02/21/23 0003  NA 137 136  K 3.1* 3.7  CL 95* 94*  CO2 26 27  GLUCOSE 109* 126*  BUN 16 22*  CREATININE 4.35* 5.41*  CALCIUM 7.7* 8.2*  MG 1.8  --   PHOS 2.1*  --     Liver Function Tests: No results for input(s): "AST", "ALT", "ALKPHOS", "BILITOT", "PROT", "ALBUMIN" in the last 168 hours. No results for input(s): "LIPASE", "AMYLASE" in the last 168 hours. No results for input(s): "AMMONIA" in the last 168 hours.  CBC: Recent Labs  Lab  02/22/2023 1118 02/21/23 0003  WBC 8.5 6.7  NEUTROABS 6.7  --   HGB 9.0* 8.5*  HCT 27.0* 25.6*  MCV 82.3 81.5  PLT 95* 100*    INR: Recent Labs  Lab 02/10/2023 1118 02/21/23 0003  INR 1.5* 1.6*   Other results:   Imaging   No results found.  Medications:     Scheduled Medications:    Infusions:    PRN Medications: acetaminophen, antiseptic oral rinse, bisacodyl, diphenhydrAMINE, glycopyrrolate, HYDROmorphone (DILAUDID) injection, LORazepam, ondansetron **OR** ondansetron (ZOFRAN) IV, oxyCODONE-acetaminophen, polyvinyl alcohol, promethazine, white petrolatum Patient Profile   57 YO AAM w/ ischemic cardiomyopathy s/p HMIII LVAD, ESRD on iHD and b/l AKA admitted with VT storm. [name] is well known to the heart failure service. Over the past several weeks he has been admitted several times with refractory VT and progressive RV failure. Despite multiple attempts at rhythm control, management of volume status with inpatient iHD, etc his RV failure and arrhythmia burden continues to progress. Today he had 4 ICD shocks during dialysis with an additional two over the weekend. He was brought to [facility] for further evaluation.   Assessment/Plan:   1. Refractory VT w/ Multiple ICD Shocks  - 3rd admit in 4 wks w/ VT storm, in setting of worsening RV Failure - medication refractory, on amiodarone + quinidine. EP has seen previous 2  hospitalizations and felt noting left to offer - now full comfort. In VT currently, tolerating ok w/ VAD still active - Anti arrhythmics stopped. ICD deactivated   2. End Stage Biventricular Heart Failure, s/p HM IIILVAD - Ischemic CM. S/p HM3 2018  - He had pump thrombosis 6/19 due to [facility] outflow graft kinking.  He had pump exchange for another HM3  - has struggled w/ worsening RV Failure. Recent echo 6/25 LVEF < 20%, RV severely reduced. This is further c/b ESRD on HD.  Recent HD sessions/volume control have been limited by hypotension and VT  -  per above, now end-stage RV Failure w/ recurrent VT - Full comfort care.    3. ESRD - he has stopped HD    4. PAD - s/p bilateral AKAs  5. GOC Palliative Care following. Appreciate input.  ICD is off. Has stopped iHD. Wants to keep VAD activated. Discussed as he gets worse we can add comfort drips prior to pump deactivation.  - He does not want to die at home. Prefers dying in the hospital rather than a hospice facility. Palliative care managing comfort meds.   PRN comfort meds in place. Once comatose will deactivate LVAD    I reviewed the LVAD parameters from today, and compared the results to the patient's prior recorded data.  No programming changes were made.  The LVAD is functioning within specified parameters.  The patient performs LVAD self-test daily.  LVAD interrogation was negative for any significant power changes, alarms or PI events/speed drops.  LVAD equipment check completed and is in good working order.  Back-up equipment present.   LVAD education done on emergency procedures and precautions and reviewed exit site care.  Length of Stay: 7  [name] Becket, NP 09-Mar-2023, 6:54 AM  VAD Team --- VAD ISSUES ONLY--- Pager [phone] (7am - 7am)  Advanced Heart Failure Team  Pager [phone] (M-F; 7a - 5p)  Please contact [facility] for night-coverage after hours (5p -7a ) and weekends on [email or web address]  Patient seen and examined with the above-signed Advanced Practice Provider and/or Housestaff. I personally reviewed laboratory data, imaging studies and relevant notes. I independently examined the patient and formulated the important aspects of the plan. I have edited the note to reflect any of my changes or salient points. I have personally discussed the plan with the patient and/or family.  Continues to get dilaudid and ativan. Lethargic but arousable. Denies discomfort  General: Lethargic but arousable HEENT: normal  Neck: supple. JVP up.  Carotids 2+ bilat; no bruits. No  lymphadenopathy or thryomegaly appreciated. Cor: LVAD hum.  + TDC Lungs: Clear. Abdomen: obese soft, nontender, + distended. No hepatosplenomegaly. No bruits or masses. Good bowel sounds. Driveline site clean. Anchor in place.  Extremities: no cyanosis, clubbing, rash. S/p bilateral AKA. + edema Neuro: lethargic but arousable.   Continue with comfort meds. VAD interrogated personally. Parameters stable.  Likely switch to comfort drips later today or tomorrow follwoed by VAD deactivation when comatose.   Arvilla Meres, MD  8:58 AM

## 2023-03-06 NOTE — Progress Notes (Signed)
Daily Progress Note   Patient Name: [name]       Date: 2023/03/17 DOB: [birth year]  Age: 57 y.o. MRN#: [id number] Attending Physician: Dorthula Nettles, DO Primary Care Physician: Erick Alley, DO Admit Date: 02/19/2023  Reason for Consultation/Follow-up: Non pain symptom management, Pain control, Psychosocial/spiritual support, and Terminal Care  Subjective: I have reviewed medical records including EPIC notes, MAR, and labs. Patient assessed at the bedside. He is lethargic, easily aroused and answers my questions. His SIL and pastor are present visiting. Discussed with RN. She is awaiting dilaudid gtt from pharmacy. No concerns at this time.  Spoke with patient's pastor and provided education on rationale for comfort drip at EOL. She verbalized her understanding and appreciation.   Questions and concerns addressed. PMT will continue to follow and support holistically.   Length of Stay: 7   Physical Exam Vitals and nursing note reviewed.  Constitutional:      General: He is not in acute distress.    Appearance: He is ill-appearing.  Cardiovascular:     Rate and Rhythm: Normal rate.  Pulmonary:     Effort: No respiratory distress.  Neurological:     Mental Status: He is lethargic.     Motor: Weakness present.           Vital Signs: BP 110/74 (BP Location: Right Arm)   Pulse 80   Temp 98.5 F (36.9 C) (Oral)   Resp 18   Wt 85.2 kg   SpO2 99%   BMI 28.56 kg/m  SpO2: SpO2: 99 % O2 Device: O2 Device: Room Air O2 Flow Rate:     Palliative Assessment/Data: PPS 10%   Palliative Care Assessment & Plan   Patient Profile: 57 y.o. male  with past medical history of ischemic cardiomyopathy s/p HMIII LVAD, ESRD on iHD and b/l AKA admitted on 02/09/2023 with VT storm.     Patient was shocked a total of 6 times.  Over the past few weeks he has been admitted 3 times for refractory VT and progressive RV failure. Despite multiple attempts at rhythm control, management of volume status with inpatient iHD, etc his RV failure and arrhythmia burden continues to progress.  Patient expressed interest in going home with his ICD deactivated.    Assessment: Principal Problem:   Ventricular tachycardia (HCC)   Terminal  care  Recommendations/Plan: Continue DNR/DNI  Continue full comfort measures; comfort gtt ordered and LVAD to be turned off per advanced HF team Psychosocial and emotional support provided PMT will continue to follow and support holistically   Prognosis:  Hours to Days  Discharge Planning: Anticipated Hospital Death  Care plan was discussed with primary RN, patient, patient's family, patient's pastor   Total time: I spent 35 minutes in the care of the patient today in the above activities and documenting the encounter.  Richardson Dopp, PA-C Palliative Medicine Team Team phone # [phone]  Thank you for allowing the Palliative Medicine Team to assist in the care of this patient. Please utilize secure chat with additional questions, if there is no response within 30 minutes please call the above phone number.  Palliative Medicine Team providers are available by phone from 7am to 7pm daily and can be reached through the team cell phone.  Should this patient require assistance outside of these hours, please call the patient's attending physician.  Portions of this note are a verbal dictation therefore any spelling and/or grammatical errors are due to the "Dragon Medical One" system interpretation.

## 2023-03-06 NOTE — Progress Notes (Signed)
5 mg x 3 of IV Diluadid and 5 mg x 3 of IV Ativan given during VAD deactivation. Total of 15 mg of Diluadid & 15 mg of Ativan bolus from infusion IV bags at bedside with Dr. Clarise Cruz.

## 2023-03-06 NOTE — Progress Notes (Signed)
LVAD Coordinator Rounding Note:  Admitted 03/03/2023 due to VT storm and multiple ICD shocks.   HM 3 LVAD implanted on 03/01/18 by Dr Donata Clay under destination therapy criteria.  Pt asleep on my arrival but arousable. Denies complaints.  Ongoing conversations with palliative care pt not ready to deactivate VAD at this time, but wishes to remain in the hospital until he passes. ICD deactivated. Palliative care following.  Vital signs: (comfort measures only starting 6/18)   HR: 80 BP: 110/74 (85) SpO2: 99 RA  LVAD interrogation reveals:  Speed: 5300 Flow: 3.1 Power: 3.9 w PI: 2.3  Alarms: none Events: 30 PI events today  Hematocrit: 20- DO NOT TITRATE  Fixed speed: 5300 Low speed limit: 5000  Drive Line: Dressing clean, dry, and intact. Anchor correctly applied. Weekly dressing changes using daily kit. Next dressing change due 03/02/2023 per bedside RN.   Labs: (comfort measures only starting 6/18) LDH trend: 151  INR trend: 1.6  Anticoagulation Plan: -INR Goal: 2.0 - 3.0 (Coumadin stopped 6/18) -ASA Dose: 81 mg daily (Stopped 6/18)  Device: -St Jude -Therapies: turned off 6/17 - Pacing: on            Arrythmias: hx of recurrent VT. Started on Quinidine 01/27/23.   Infection:   Adverse Events on VAD: -Recent admissions for VT. Started on Quinidine.   Plan/Recommendations:  1. Page VAD coordinator for any equipment or drive line issues 2. Weekly drive line dressing changes per bedside RN  Simmie Davies RN,BSN VAD Coordinator  Office: [phone]  24/7 Pager: [phone]

## 2023-03-06 NOTE — Plan of Care (Signed)
  Problem: Education: Goal: Patient will understand all VAD equipment and how it functions Outcome: Progressing   Problem: Education: Goal: Knowledge of General Education information will improve Description: Including pain rating scale, medication(s)/side effects and non-pharmacologic comfort measures Outcome: Progressing Problem: Health Behavior/Discharge Planning: Goal: Ability to manage health-related needs will improve Outcome: Progressing   Problem: Clinical Measurements: Goal: Will remain free from infection Outcome: Progressing Goal: Respiratory complications will improve Outcome: Progressing Goal: Cardiovascular complication will be avoided Outcome: Progressing   Problem: Activity: Goal: Risk for activity intolerance will decrease Outcome: Progressing

## 2023-03-06 NOTE — Death Summary Note (Signed)
  Advanced Heart Failure Death Summary  Death Summary   Patient ID: DEVANTAE BABE MRN: [id number], DOB/AGE: 58-Jan-1967 58 y.o. 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 AAM w/ ischemic cardiomyopathy s/p HMIII LVAD, ESRD on iHD and b/l AKA admitted with VT storm. Mr. Festa is well known to the heart failure service. Over the past several weeks he has been admitted several times with refractory VT and progressive RV failure. Despite multiple attempts at rhythm control, management of volume status with inpatient iHD, etc. his RV failure and arrhythmia burden continues to progress. During the most recent admission EP saw patient and antiarrythmic medications were titrated to max tolerated doses to attempt to terminate VT frequency. Despite uptitration of meds, Mr. Lindell had persistent VT.  During this admission he had a total of 4 ICD shocks (morning before coming in) during dialysis and an additional 2 over the weekend. Palliative care was consulted and Mr. Canoy wishes were to stop HD and turn off ICD. He wanted to pass peacefully in the hospital when he was ready. This morning he decided he wanted full comfort care measures and comfort drips to be initiated. Per his wishes, comfort drips were initiated and LVAD was deactivated by LVAD coordinator. Family and their pastor was present. Mr. Kelleher passed away peacefully at 64 with family and care team at bedside.   Consultations  Palliative care  Duration of Discharge Encounter: Greater than 35 minutes   Signed, Alen Bleacher, NP 03/01/2023, 1:56 PM

## 2023-03-06 NOTE — Progress Notes (Signed)
VAD Coordinator made aware that pt is ready to progress to comfort drips with VAD deactivation. Dr. Gala Romney made aware. Orders received for comfort drips with bolus. Drips initiated by bedside RN. VAD deactivated per Dr. Gala Romney. Pt passed peacefully surrounded by family at 1340. All questions answered following expiration of patient.   Simmie Davies RN, BSN VAD Coordinator 24/7 Pager [phone]

## 2023-03-06 DEATH — deceased

## 2023-03-28 ENCOUNTER — Encounter (HOSPITAL_COMMUNITY): Payer: Medicare Other | Admitting: Cardiology
